# Patient Record
Sex: Female | Born: 1960 | Race: Black or African American | Hispanic: No | Marital: Married | Smoking: Never smoker
Health system: Southern US, Community
[De-identification: ages and names within clinical notes are randomized; demographics above are authoritative.]

## PROBLEM LIST (undated history)

## (undated) DIAGNOSIS — R519 Headache, unspecified: Secondary | ICD-10-CM

## (undated) DIAGNOSIS — Z9889 Other specified postprocedural states: Secondary | ICD-10-CM

## (undated) DIAGNOSIS — M316 Other giant cell arteritis: Secondary | ICD-10-CM

## (undated) DIAGNOSIS — K219 Gastro-esophageal reflux disease without esophagitis: Secondary | ICD-10-CM

## (undated) DIAGNOSIS — I2510 Atherosclerotic heart disease of native coronary artery without angina pectoris: Secondary | ICD-10-CM

## (undated) DIAGNOSIS — E119 Type 2 diabetes mellitus without complications: Secondary | ICD-10-CM

## (undated) DIAGNOSIS — H269 Unspecified cataract: Secondary | ICD-10-CM

## (undated) DIAGNOSIS — Z973 Presence of spectacles and contact lenses: Secondary | ICD-10-CM

## (undated) DIAGNOSIS — E782 Mixed hyperlipidemia: Secondary | ICD-10-CM

## (undated) DIAGNOSIS — R0789 Other chest pain: Secondary | ICD-10-CM

## (undated) DIAGNOSIS — Z5189 Encounter for other specified aftercare: Secondary | ICD-10-CM

## (undated) DIAGNOSIS — Z992 Dependence on renal dialysis: Secondary | ICD-10-CM

## (undated) DIAGNOSIS — N289 Disorder of kidney and ureter, unspecified: Secondary | ICD-10-CM

## (undated) DIAGNOSIS — I10 Essential (primary) hypertension: Secondary | ICD-10-CM

## (undated) DIAGNOSIS — N183 Chronic kidney disease, stage 3 (moderate): Secondary | ICD-10-CM

## (undated) DIAGNOSIS — N186 End stage renal disease: Secondary | ICD-10-CM

## (undated) DIAGNOSIS — I5030 Unspecified diastolic (congestive) heart failure: Secondary | ICD-10-CM

## (undated) DIAGNOSIS — R112 Nausea with vomiting, unspecified: Secondary | ICD-10-CM

## (undated) DIAGNOSIS — D649 Anemia, unspecified: Secondary | ICD-10-CM

## (undated) DIAGNOSIS — R51 Headache: Secondary | ICD-10-CM

## (undated) HISTORY — DX: Mixed hyperlipidemia: E78.2

## (undated) HISTORY — DX: Essential (primary) hypertension: I10

## (undated) HISTORY — DX: Unspecified cataract: H26.9

## (undated) HISTORY — PX: ABDOMINAL HYSTERECTOMY: SHX81

## (undated) HISTORY — PX: APPENDECTOMY: SHX54

## (undated) HISTORY — DX: Gastro-esophageal reflux disease without esophagitis: K21.9

## (undated) HISTORY — DX: Other specified postprocedural states: Z98.890

## (undated) HISTORY — DX: Encounter for other specified aftercare: Z51.89

## (undated) HISTORY — DX: Type 2 diabetes mellitus without complications: E11.9

## (undated) HISTORY — DX: Chronic kidney disease, stage 3 (moderate): N18.3

---

## 2001-01-28 ENCOUNTER — Encounter: Payer: Self-pay | Admitting: Internal Medicine

## 2001-01-28 ENCOUNTER — Ambulatory Visit (HOSPITAL_COMMUNITY): Admission: RE | Admit: 2001-01-28 | Discharge: 2001-01-28 | Payer: Self-pay | Admitting: Internal Medicine

## 2001-06-10 ENCOUNTER — Ambulatory Visit (HOSPITAL_COMMUNITY): Admission: RE | Admit: 2001-06-10 | Discharge: 2001-06-10 | Payer: Self-pay | Admitting: Internal Medicine

## 2001-06-10 ENCOUNTER — Encounter: Payer: Self-pay | Admitting: Internal Medicine

## 2002-05-29 ENCOUNTER — Encounter: Payer: Self-pay | Admitting: Emergency Medicine

## 2002-05-29 ENCOUNTER — Emergency Department (HOSPITAL_COMMUNITY): Admission: EM | Admit: 2002-05-29 | Discharge: 2002-05-29 | Payer: Self-pay | Admitting: Emergency Medicine

## 2002-06-11 ENCOUNTER — Ambulatory Visit (HOSPITAL_COMMUNITY): Admission: RE | Admit: 2002-06-11 | Discharge: 2002-06-11 | Payer: Self-pay | Admitting: Internal Medicine

## 2002-06-11 ENCOUNTER — Encounter: Payer: Self-pay | Admitting: Internal Medicine

## 2003-06-25 ENCOUNTER — Encounter: Admission: RE | Admit: 2003-06-25 | Discharge: 2003-06-25 | Payer: Self-pay | Admitting: Internal Medicine

## 2003-10-07 ENCOUNTER — Emergency Department (HOSPITAL_COMMUNITY): Admission: EM | Admit: 2003-10-07 | Discharge: 2003-10-07 | Payer: Self-pay | Admitting: Emergency Medicine

## 2005-07-13 ENCOUNTER — Ambulatory Visit (HOSPITAL_COMMUNITY): Admission: RE | Admit: 2005-07-13 | Discharge: 2005-07-13 | Payer: Self-pay | Admitting: Family Medicine

## 2005-07-20 ENCOUNTER — Ambulatory Visit: Payer: Self-pay | Admitting: Family Medicine

## 2005-07-20 LAB — CONVERTED CEMR LAB: Pap Smear: NORMAL

## 2005-08-08 ENCOUNTER — Ambulatory Visit: Payer: Self-pay | Admitting: Family Medicine

## 2005-09-19 ENCOUNTER — Ambulatory Visit: Payer: Self-pay | Admitting: Family Medicine

## 2006-01-02 ENCOUNTER — Ambulatory Visit: Payer: Self-pay | Admitting: Family Medicine

## 2006-01-24 ENCOUNTER — Ambulatory Visit: Payer: Self-pay | Admitting: Family Medicine

## 2006-07-16 ENCOUNTER — Ambulatory Visit (HOSPITAL_COMMUNITY): Admission: RE | Admit: 2006-07-16 | Discharge: 2006-07-16 | Payer: Self-pay | Admitting: Family Medicine

## 2006-10-24 ENCOUNTER — Ambulatory Visit: Payer: Self-pay | Admitting: Family Medicine

## 2006-10-24 LAB — CONVERTED CEMR LAB
ALT: 26 units/L (ref 0–35)
AST: 17 units/L (ref 0–37)
Albumin: 4.3 g/dL (ref 3.5–5.2)
Alkaline Phosphatase: 65 units/L (ref 39–117)
BUN: 20 mg/dL (ref 6–23)
Basophils Absolute: 0 10*3/uL (ref 0.0–0.1)
Basophils Relative: 1 % (ref 0–1)
Bilirubin, Direct: 0.1 mg/dL (ref 0.0–0.3)
CO2: 26 meq/L (ref 19–32)
Calcium: 9.4 mg/dL (ref 8.4–10.5)
Chloride: 101 meq/L (ref 96–112)
Cholesterol: 239 mg/dL — ABNORMAL HIGH (ref 0–200)
Creatinine, Ser: 1.46 mg/dL — ABNORMAL HIGH (ref 0.40–1.20)
Eosinophils Absolute: 0.1 10*3/uL (ref 0.0–0.7)
Eosinophils Relative: 2 % (ref 0–5)
Glucose, Bld: 74 mg/dL (ref 70–99)
HCT: 42.5 % (ref 36.0–46.0)
HDL: 63 mg/dL (ref >39)
Hemoglobin: 14 g/dL (ref 12.0–15.0)
Indirect Bilirubin: 0.5 mg/dL (ref 0.0–0.9)
LDL Cholesterol: 153 mg/dL — ABNORMAL HIGH (ref 0–99)
Lymphocytes Relative: 35 % (ref 12–46)
Lymphs Abs: 2.9 10*3/uL (ref 0.7–3.3)
MCHC: 32.9 g/dL (ref 30.0–36.0)
MCV: 88.5 fL (ref 78.0–100.0)
Monocytes Absolute: 0.6 10*3/uL (ref 0.2–0.7)
Monocytes Relative: 7 % (ref 3–11)
Neutro Abs: 4.6 10*3/uL (ref 1.7–7.7)
Neutrophils Relative %: 56 % (ref 43–77)
Platelets: 314 10*3/uL (ref 150–400)
Potassium: 3.4 meq/L — ABNORMAL LOW (ref 3.5–5.3)
RBC: 4.8 M/uL (ref 3.87–5.11)
RDW: 14.4 % — ABNORMAL HIGH (ref 11.5–14.0)
Sodium: 142 meq/L (ref 135–145)
TSH: 0.564 microintl units/mL (ref 0.350–5.50)
Total Bilirubin: 0.6 mg/dL (ref 0.3–1.2)
Total CHOL/HDL Ratio: 3.8
Total Protein: 8 g/dL (ref 6.0–8.3)
Triglycerides: 117 mg/dL (ref <150)
VLDL: 23 mg/dL (ref 0–40)
WBC: 8.3 10*3/uL (ref 4.0–10.5)

## 2006-12-27 ENCOUNTER — Ambulatory Visit: Payer: Self-pay | Admitting: Family Medicine

## 2007-02-07 ENCOUNTER — Encounter: Payer: Self-pay | Admitting: Family Medicine

## 2007-02-07 ENCOUNTER — Other Ambulatory Visit: Admission: RE | Admit: 2007-02-07 | Discharge: 2007-02-07 | Payer: Self-pay | Admitting: Family Medicine

## 2007-02-07 ENCOUNTER — Ambulatory Visit: Payer: Self-pay | Admitting: Family Medicine

## 2007-02-07 LAB — CONVERTED CEMR LAB: Pap Smear: NORMAL

## 2007-03-11 ENCOUNTER — Encounter: Payer: Self-pay | Admitting: Family Medicine

## 2007-03-11 LAB — CONVERTED CEMR LAB
BUN: 24 mg/dL — ABNORMAL HIGH (ref 6–23)
CO2: 25 meq/L (ref 19–32)
Calcium: 9.3 mg/dL (ref 8.4–10.5)
Chloride: 103 meq/L (ref 96–112)
Cholesterol: 225 mg/dL — ABNORMAL HIGH (ref 0–200)
Creatinine, Ser: 1.59 mg/dL — ABNORMAL HIGH (ref 0.40–1.20)
Glucose, Bld: 82 mg/dL (ref 70–99)
HDL: 60 mg/dL (ref >39)
LDL Cholesterol: 155 mg/dL — ABNORMAL HIGH (ref 0–99)
Potassium: 3.5 meq/L (ref 3.5–5.3)
Sodium: 143 meq/L (ref 135–145)
Total CHOL/HDL Ratio: 3.8
Triglycerides: 52 mg/dL (ref <150)
VLDL: 10 mg/dL (ref 0–40)

## 2007-04-12 ENCOUNTER — Ambulatory Visit: Payer: Self-pay | Admitting: Family Medicine

## 2007-04-15 ENCOUNTER — Ambulatory Visit (HOSPITAL_COMMUNITY): Admission: RE | Admit: 2007-04-15 | Discharge: 2007-04-15 | Payer: Self-pay | Admitting: Family Medicine

## 2007-05-22 ENCOUNTER — Ambulatory Visit: Payer: Self-pay | Admitting: Family Medicine

## 2007-07-18 ENCOUNTER — Ambulatory Visit (HOSPITAL_COMMUNITY): Admission: RE | Admit: 2007-07-18 | Discharge: 2007-07-18 | Payer: Self-pay | Admitting: Family Medicine

## 2007-08-15 ENCOUNTER — Encounter: Payer: Self-pay | Admitting: Family Medicine

## 2007-11-01 ENCOUNTER — Emergency Department (HOSPITAL_COMMUNITY): Admission: EM | Admit: 2007-11-01 | Discharge: 2007-11-01 | Payer: Self-pay | Admitting: Emergency Medicine

## 2008-03-11 ENCOUNTER — Encounter: Payer: Self-pay | Admitting: Family Medicine

## 2008-03-11 DIAGNOSIS — E669 Obesity, unspecified: Secondary | ICD-10-CM | POA: Insufficient documentation

## 2008-03-11 DIAGNOSIS — I10 Essential (primary) hypertension: Secondary | ICD-10-CM | POA: Insufficient documentation

## 2008-03-11 DIAGNOSIS — E782 Mixed hyperlipidemia: Secondary | ICD-10-CM | POA: Insufficient documentation

## 2008-07-01 ENCOUNTER — Ambulatory Visit (HOSPITAL_COMMUNITY): Admission: RE | Admit: 2008-07-01 | Discharge: 2008-07-01 | Payer: Self-pay | Admitting: Family Medicine

## 2008-10-10 ENCOUNTER — Observation Stay (HOSPITAL_COMMUNITY): Admission: EM | Admit: 2008-10-10 | Discharge: 2008-10-11 | Payer: Self-pay | Admitting: Emergency Medicine

## 2009-06-12 ENCOUNTER — Emergency Department (HOSPITAL_COMMUNITY): Admission: EM | Admit: 2009-06-12 | Discharge: 2009-06-12 | Payer: Self-pay | Admitting: Emergency Medicine

## 2009-07-05 ENCOUNTER — Ambulatory Visit (HOSPITAL_COMMUNITY): Admission: RE | Admit: 2009-07-05 | Discharge: 2009-07-05 | Payer: Self-pay | Admitting: Family Medicine

## 2009-07-05 LAB — HM MAMMOGRAPHY: HM Mammogram: NORMAL

## 2009-08-14 DIAGNOSIS — Z9889 Other specified postprocedural states: Secondary | ICD-10-CM

## 2009-08-14 HISTORY — PX: COLONOSCOPY: SHX174

## 2009-08-14 HISTORY — DX: Other specified postprocedural states: Z98.890

## 2009-11-16 LAB — HM PAP SMEAR: HM Pap smear: NORMAL

## 2010-04-19 ENCOUNTER — Emergency Department (HOSPITAL_COMMUNITY): Admission: EM | Admit: 2010-04-19 | Discharge: 2010-04-19 | Payer: Self-pay | Admitting: Emergency Medicine

## 2010-04-21 ENCOUNTER — Encounter: Payer: Self-pay | Admitting: Adult Health

## 2010-04-21 ENCOUNTER — Encounter (INDEPENDENT_AMBULATORY_CARE_PROVIDER_SITE_OTHER): Payer: Self-pay | Admitting: *Deleted

## 2010-04-21 ENCOUNTER — Ambulatory Visit: Payer: Self-pay | Admitting: Cardiology

## 2010-04-21 DIAGNOSIS — R0602 Shortness of breath: Secondary | ICD-10-CM

## 2010-04-21 DIAGNOSIS — R0600 Dyspnea, unspecified: Secondary | ICD-10-CM | POA: Insufficient documentation

## 2010-04-21 DIAGNOSIS — R0789 Other chest pain: Secondary | ICD-10-CM | POA: Insufficient documentation

## 2010-04-22 ENCOUNTER — Encounter: Payer: Self-pay | Admitting: Family Medicine

## 2010-04-22 LAB — CONVERTED CEMR LAB
Pro B Natriuretic peptide (BNP): 7.2 pg/mL (ref 0.0–100.0)
TSH: 0.852 microintl units/mL (ref 0.350–4.500)

## 2010-04-27 ENCOUNTER — Encounter: Payer: Self-pay | Admitting: Cardiology

## 2010-04-27 ENCOUNTER — Ambulatory Visit (HOSPITAL_COMMUNITY): Admission: RE | Admit: 2010-04-27 | Discharge: 2010-04-27 | Payer: Self-pay | Admitting: Cardiology

## 2010-04-27 ENCOUNTER — Ambulatory Visit: Payer: Self-pay | Admitting: Cardiology

## 2010-05-03 ENCOUNTER — Ambulatory Visit: Payer: Self-pay | Admitting: Cardiology

## 2010-07-11 ENCOUNTER — Ambulatory Visit (HOSPITAL_COMMUNITY): Admission: RE | Admit: 2010-07-11 | Discharge: 2010-07-11 | Payer: Self-pay | Admitting: Family Medicine

## 2010-09-03 ENCOUNTER — Encounter: Payer: Self-pay | Admitting: Internal Medicine

## 2010-09-13 NOTE — Assessment & Plan Note (Signed)
Summary: POST ED VISIT ON 04/19/10/TG   Visit Type:  Initial Consult Primary Provider:  [facility]  CC:  some chestpain.  History of Present Illness: [name] is a pleasant 50 y/o AAF we are seeing on referral from [facility] practice for complaints of chest pain.  She had no prior history of CAD.  She has a 17 yr history of hypertension with pre-eclampsia during pregnancy.  She has been on antihypertensives since that time. She was seen in the [facility] ER for complaints of constant chest pressure lasting a week, waxing and waneing associated witn dyspnea.  In the ER she was found to be hypokalemic with level of 2.9.  She was repleted and advised to follow-up with cardiology for evaluation.  Her additional history includes arthritis and hypercholesterolemia.  She states the pain is a pressure with some radiation to the left arm, and increased dyspnea sometimes only at rest, but with exertion as well.    Preventive Screening-Counseling & Management  Alcohol-Tobacco     Alcohol drinks/day: 0     Smoking Status: never  Current Medications (verified): 1)  Azor 10-40 Mg  Tabs (Amlodipine-Olmesartan) .... One Tab By Mouth Once Daily 2)  Aspir-Low 81 Mg Tbec (Aspirin) .... Take 1 Tab Daily 3)  Naprosyn 500 Mg Tabs (Naproxen) .... Use As Needed Only 4)  Lipitor 20 Mg Tabs (Atorvastatin Calcium) .... Take 1 Tab Daily 5)  Hydrochlorothiazide 12.5 Mg Caps (Hydrochlorothiazide) .... Take 1 Tablet By Mouth Once A Day 6)  Nitrostat 0.4 Mg Subl (Nitroglycerin) .... Take As Needed 7)  Klor-Con 20 Meq Pack (Potassium Chloride) .... Use 1 Pack  By Mouth Daily  Allergies (verified): No Known Drug Allergies  Family History: Dad -good health Mom deceased HTN,DM,MI age 54 Brothers x2 Hypertension  Social History: [employer] Divorced one child Never Smoked Alcohol use-no Drug use-no Alcohol drinks/day:  0  Review of Systems       L arm pain.  All other  systems have been reviewed and are negative unless stated above.   Vital Signs:  Patient profile:   50 year old female Height:      64 inches Weight:      162 pounds BMI:     27.91 Pulse rate:   81 / minute BP sitting:   127 / 79  (right arm)  Vitals Entered By: Doretha Sou, CNA (April 21, 2010 1:01 PM)  Physical Exam  General:  Well developed, well nourished, in no acute distress. Head:  normocephalic and atraumatic Eyes:  PERRLA/EOM intact; conjunctiva and lids normal. Wears glasses Mouth:  Teeth, gums and palate normal. Oral mucosa normal. Lungs:  Diminshed in the LLL Heart:  Non-displaced PMI, chest non-tender; regular rate and rhythm, S1, S2 without murmurs, rubs or gallops. Carotid upstroke normal, no bruit. Normal abdominal aortic size, no bruits. Femorals normal pulses, no bruits. Pedals normal pulses. No edema, no varicosities. Abdomen:  Bowel sounds positive; abdomen soft and non-tender without masses, organomegaly, or hernias noted. No hepatosplenomegaly. Msk:  Back normal, normal gait. Muscle strength and tone normal. Pulses:  pulses normal in all 4 extremities Extremities:  No clubbing or cyanosis. Neurologic:  Alert and oriented x 3. Psych:  Normal affect.   EKG  Procedure date:  04/21/2010  Findings:      Normal sinus rhythm with rate of:  78 bpm  Impression & Recommendations:  Problem # 1:  CHEST PAIN (ICD-786.50) Pain appears atypical for cardiac etiology  of chest pain.  Hypokalemia can certainly cause chest pain, but she is now on repletion.  After being seen as well by Dr. Lattie Haw, we will decrease her HCTZ and K-replacement. to half doses. Recheck her BMET in two weeks.  We will schedule stress echo for diagnostic/prognostic purposes.  Follow up appointment for discussion fo test results. The following medications were removed from the medication list:    Benazepril-hydrochlorothiazide 20-12.5 Mg Tabs (Benazepril-hydrochlorothiazide) .Marland Kitchen..Marland Kitchen Two tabs  by mouth once daily    Cardizem La 180 Mg Xr24h-tab (Diltiazem hcl coated beads) ..... One tab by mouth once daily Her updated medication list for this problem includes:    Azor 10-40 Mg Tabs (Amlodipine-olmesartan) ..... One tab by mouth once daily    Aspir-low 81 Mg Tbec (Aspirin) .Marland Kitchen... Take 1 tab daily    Nitrostat 0.4 Mg Subl (Nitroglycerin) .Marland Kitchen... Take as needed  Orders: EKG w/ Interpretation (93000) T-D-Dimer Fibrin Derivatives Quantitive [id number]) T-TSH [phone]) Stress Echo (Stress Echo)  Problem # 2:  SHORTNESS OF BREATH (ICD-786.05) Treadmill test will allow [place] to evaluate breathing status during exertion. The following medications were removed from the medication list:    Benazepril-hydrochlorothiazide 20-12.5 Mg Tabs (Benazepril-hydrochlorothiazide) .Marland Kitchen..Marland Kitchen Two tabs by mouth once daily    Cardizem La 180 Mg Xr24h-tab (Diltiazem hcl coated beads) ..... One tab by mouth once daily Her updated medication list for this problem includes:    Azor 10-40 Mg Tabs (Amlodipine-olmesartan) ..... One tab by mouth once daily    Aspir-low 81 Mg Tbec (Aspirin) .Marland Kitchen... Take 1 tab daily    Hydrochlorothiazide 12.5 Mg Caps (Hydrochlorothiazide) .Marland Kitchen... Take 1 tablet by mouth once a day  Orders: T-BNP  (B Natriuretic Peptide) [id number]) T-D-Dimer Fibrin Derivatives Quantitive [id number]) T-TSH [phone]) Stress Echo (Stress Echo)  Problem # 3:  HYPERTENSION (ICD-401.9) Continue to monitor this with changes in medications. The following medications were removed from the medication list:    Benazepril-hydrochlorothiazide 20-12.5 Mg Tabs (Benazepril-hydrochlorothiazide) .Marland Kitchen..Marland Kitchen Two tabs by mouth once daily    Cardizem La 180 Mg Xr24h-tab (Diltiazem hcl coated beads) ..... One tab by mouth once daily Her updated medication list for this problem includes:    Azor 10-40 Mg Tabs (Amlodipine-olmesartan) ..... One tab by mouth once daily    Aspir-low 81 Mg Tbec (Aspirin) .Marland Kitchen... Take 1 tab  daily    Hydrochlorothiazide 12.5 Mg Caps (Hydrochlorothiazide) .Marland Kitchen... Take 1 tablet by mouth once a day  Patient Instructions: 1)  Your physician has recommended you make the following change in your medication: DECREASE HYDROCHLOROTHIAZIDE TO 12.5MG  DAILY.  You may break your 25mg  in half until they are finished. ALSO,YOUR POTASSIUM HAS BEEN DECREASED TO ONCE DAILY. Your new prescriptions have been sent to your pharmacy. 2)  Your physician has requested that you have a stress echocardiogram. For further information please visit [email or web address].  Please follow instruction sheet as given. 3)  Your physician recommends that you return for lab work in:BNP,TSH,D-DIMER. Please have these done at [facility]. 4)  Your physician recommends that you schedule a follow-up appointment in: after your testing is completed. Prescriptions: KLOR-CON 20 MEQ PACK (POTASSIUM CHLORIDE) use 1 pack  by mouth daily  #30 x 6   Entered by:   Georgina Peer   Authorized by:   Jory Sims, NP   Signed by:   Georgina Peer on 04/21/2010   Method used:   Electronically to        [facility]. [phone]* (retail)       [phone]       [place], [place]  [address]       Ph: [phone] or [phone]       Fax: [phone]   RxID:   [id number] HYDROCHLOROTHIAZIDE 12.5 MG CAPS (HYDROCHLOROTHIAZIDE) Take 1 tablet by mouth once a day  #30 x 6   Entered by:   Georgina Peer   Authorized by:   Jory Sims, NP   Signed by:   Georgina Peer on 04/21/2010   Method used:   Electronically to        [facility]. [phone]* (retail)       [address]       [place], [place]  [address]       Ph: [phone] or [phone]       Fax: [phone]   RxID:   [id number]

## 2010-09-13 NOTE — Assessment & Plan Note (Signed)
Summary: F/U TESTS TO BE DONE 04/27/10/TG   Visit Type:  Follow-up Primary Provider:  [facility]  CC:  no cardiology complaints.  History of Present Illness: Megan [name] is a very pleasant 50 y/o AAF who we are seeing on consultation for chest pain. She has a history of hypertension and hypercholesterolemia.  She was schduled for a stress echo for evaluation of chest discomfort and is here for results.  She continues to have occaisional discomfort but it is transient.  Current Medications (verified): 1)  Azor 10-40 Mg  Tabs (Amlodipine-Olmesartan) .... One Tab By Mouth Once Daily 2)  Aspir-Low 81 Mg Tbec (Aspirin) .... Take 1 Tab Daily 3)  Naprosyn 500 Mg Tabs (Naproxen) .... Use As Needed Only 4)  Lipitor 20 Mg Tabs (Atorvastatin Calcium) .... Take 1 Tab Daily 5)  Hydrochlorothiazide 12.5 Mg Caps (Hydrochlorothiazide) .... Take 1 Tablet By Mouth Once A Day 6)  Nitrostat 0.4 Mg Subl (Nitroglycerin) .... Take As Needed 7)  Klor-Con 20 Meq Pack (Potassium Chloride) .... Use 1 Pack  By Mouth Daily  Allergies (verified): No Known Drug Allergies  Review of Systems       All other systems have been reviewed and are negative unless stated above.   Vital Signs:  Patient profile:   50 year old female Weight:      164 pounds BMI:     28.25 Pulse rate:   92 / minute BP sitting:   127 / 85  (right arm)  Vitals Entered ByJeani Hawking Via LPN (September 20, 624THL 1:43 PM)  Physical Exam  General:  Well developed, well nourished, in no acute distress. Lungs:  Clear bilaterally to auscultation and percussion. Heart:  Non-displaced PMI, chest non-tender; regular rate and rhythm, S1, S2 without murmurs, rubs or gallops. Carotid upstroke normal, no bruit. Normal abdominal aortic size, no bruits. Femorals normal pulses, no bruits. Pedals normal pulses. No edema, no varicosities. Psych:  Normal affect.   Impression & Recommendations:  Problem # 1:  CHEST PAIN  (ICD-786.50) Patients stress echo demonstrated  Negative exercise treadmill test for ischemia at a maximum workload   of 7 mets.  No chest pain was reported.  There was a hypertensive  response to exercise.  No inducible arrhythmias.  No inducible wall motion abnormalities to indicate ischemia.  Ressurance to patient concerning heart disease and need to maintain adequate BP. She is normal here in the office with this assessment on current medications.  I have discussed other etiologies of chest pain to include GI disturbances, Gallbladder pain, and enxiety.  Sheis referred backl to her primary care at [facility] practice for further workup.    Her updated medication list for this problem includes:    Azor 10-40 Mg Tabs (Amlodipine-olmesartan) ..... One tab by mouth once daily    Aspir-low 81 Mg Tbec (Aspirin) .Marland Kitchen... Take 1 tab daily    Nitrostat 0.4 Mg Subl (Nitroglycerin) .Marland Kitchen... Take as needed  Orders: Misc. Referral (Misc. Ref)  Problem # 2:  HYPERTENSION (ICD-401.9) Assessment: Unchanged  Her updated medication list for this problem includes:    Azor 10-40 Mg Tabs (Amlodipine-olmesartan) ..... One tab by mouth once daily    Aspir-low 81 Mg Tbec (Aspirin) .Marland Kitchen... Take 1 tab daily    Hydrochlorothiazide 12.5 Mg Caps (Hydrochlorothiazide) .Marland Kitchen... Take 1 tablet by mouth once a day  Patient Instructions: 1)  Your physician recommends that you schedule a follow-up appointment in: as needed (you will follow up with [facility] for non-cardiac chest pain) 2)  Your physician recommends that you continue on your current medications as directed. Please refer to the Current Medication list given to you today.

## 2010-09-13 NOTE — Letter (Signed)
Summary: Stress Echocardiogram Information Sheet  [facility] [place]. [address]  [address]   Phone: [phone]  Fax: [phone]      April 21, 2010 MRN: [id number] light prior to the test.   Megan [name]  Doctor: Appointment Date: Appointment Time: Appointment Location: [facility]  Stress Echocardiogram Information Sheet    Instructions:   1. You may take your  medicine the dayof your  test.  2. Eat light prior to the test.  3. Dress prepared to exercise.  4. DO NOT use ANY caffine or tobacco products 3 hours before appointment.  5. Report to the [facility] on the1st floor.  6. Please bring all current prescription medications.  7. If you have any questions, please call [phone]

## 2010-09-13 NOTE — Letter (Signed)
Summary: rpc chart  rpc chart   Imported By: Eliezer Mccoy 03/14/2010 14:44:08  _____________________________________________________________________  External Attachment:    Type:   Image     Comment:   External Document

## 2010-10-27 LAB — CBC
HCT: 37.3 % (ref 36.0–46.0)
Hemoglobin: 12.4 g/dL (ref 12.0–15.0)
MCH: 29.1 pg (ref 26.0–34.0)
MCHC: 33.3 g/dL (ref 30.0–36.0)
MCV: 87.4 fL (ref 78.0–100.0)
Platelets: 283 10*3/uL (ref 150–400)
RBC: 4.27 MIL/uL (ref 3.87–5.11)
RDW: 16 % — ABNORMAL HIGH (ref 11.5–15.5)
WBC: 6.7 10*3/uL (ref 4.0–10.5)

## 2010-10-27 LAB — POCT CARDIAC MARKERS
CKMB, poc: 1.8 ng/mL (ref 1.0–8.0)
Myoglobin, poc: 151 ng/mL (ref 12–200)
Troponin i, poc: <0.05 ng/mL (ref 0.00–0.09)

## 2010-10-27 LAB — DIFFERENTIAL
Basophils Absolute: 0 10*3/uL (ref 0.0–0.1)
Basophils Relative: 1 % (ref 0–1)
Eosinophils Absolute: 0.1 10*3/uL (ref 0.0–0.7)
Eosinophils Relative: 2 % (ref 0–5)
Lymphocytes Relative: 32 % (ref 12–46)
Lymphs Abs: 2.1 10*3/uL (ref 0.7–4.0)
Monocytes Absolute: 0.6 10*3/uL (ref 0.1–1.0)
Monocytes Relative: 9 % (ref 3–12)
Neutro Abs: 3.8 10*3/uL (ref 1.7–7.7)
Neutrophils Relative %: 56 % (ref 43–77)

## 2010-10-27 LAB — D-DIMER, QUANTITATIVE: D-Dimer, Quant: 0.4 ug/mL-FEU (ref 0.00–0.48)

## 2010-10-27 LAB — BASIC METABOLIC PANEL
BUN: 23 mg/dL (ref 6–23)
CO2: 28 mEq/L (ref 19–32)
Calcium: 9.4 mg/dL (ref 8.4–10.5)
Chloride: 105 mEq/L (ref 96–112)
Creatinine, Ser: 1.25 mg/dL — ABNORMAL HIGH (ref 0.4–1.2)
GFR calc Af Amer: 55 mL/min — ABNORMAL LOW (ref >60)
GFR calc non Af Amer: 46 mL/min — ABNORMAL LOW (ref >60)
Glucose, Bld: 102 mg/dL — ABNORMAL HIGH (ref 70–99)
Potassium: 2.9 mEq/L — ABNORMAL LOW (ref 3.5–5.1)
Sodium: 141 mEq/L (ref 135–145)

## 2010-11-29 LAB — CBC
HCT: 39.5 % (ref 36.0–46.0)
Hemoglobin: 13.2 g/dL (ref 12.0–15.0)
MCHC: 33.4 g/dL (ref 30.0–36.0)
MCV: 89.5 fL (ref 78.0–100.0)
Platelets: 296 10*3/uL (ref 150–400)
RBC: 4.42 MIL/uL (ref 3.87–5.11)
RDW: 15.5 % (ref 11.5–15.5)
WBC: 6.7 10*3/uL (ref 4.0–10.5)

## 2010-11-29 LAB — BASIC METABOLIC PANEL
BUN: 15 mg/dL (ref 6–23)
BUN: 17 mg/dL (ref 6–23)
CO2: 25 mEq/L (ref 19–32)
CO2: 30 mEq/L (ref 19–32)
Calcium: 8.8 mg/dL (ref 8.4–10.5)
Calcium: 9.3 mg/dL (ref 8.4–10.5)
Chloride: 102 mEq/L (ref 96–112)
Chloride: 108 mEq/L (ref 96–112)
Creatinine, Ser: 1.28 mg/dL — ABNORMAL HIGH (ref 0.4–1.2)
Creatinine, Ser: 1.3 mg/dL — ABNORMAL HIGH (ref 0.4–1.2)
GFR calc Af Amer: 53 mL/min — ABNORMAL LOW (ref >60)
GFR calc Af Amer: 54 mL/min — ABNORMAL LOW (ref >60)
GFR calc non Af Amer: 44 mL/min — ABNORMAL LOW (ref >60)
GFR calc non Af Amer: 45 mL/min — ABNORMAL LOW (ref >60)
Glucose, Bld: 110 mg/dL — ABNORMAL HIGH (ref 70–99)
Glucose, Bld: 126 mg/dL — ABNORMAL HIGH (ref 70–99)
Potassium: 3 mEq/L — ABNORMAL LOW (ref 3.5–5.1)
Potassium: 3.7 mEq/L (ref 3.5–5.1)
Sodium: 138 mEq/L (ref 135–145)
Sodium: 139 mEq/L (ref 135–145)

## 2010-11-29 LAB — HEPATIC FUNCTION PANEL
ALT: 16 U/L (ref 0–35)
AST: 18 U/L (ref 0–37)
Albumin: 3.5 g/dL (ref 3.5–5.2)
Alkaline Phosphatase: 72 U/L (ref 39–117)
Bilirubin, Direct: 0.1 mg/dL (ref 0.0–0.3)
Indirect Bilirubin: 0.5 mg/dL (ref 0.3–0.9)
Total Bilirubin: 0.6 mg/dL (ref 0.3–1.2)
Total Protein: 6.8 g/dL (ref 6.0–8.3)

## 2010-11-29 LAB — POCT CARDIAC MARKERS
CKMB, poc: 1.6 ng/mL (ref 1.0–8.0)
Myoglobin, poc: 109 ng/mL (ref 12–200)
Troponin i, poc: <0.05 ng/mL (ref 0.00–0.09)

## 2010-11-29 LAB — CARDIAC PANEL(CRET KIN+CKTOT+MB+TROPI)
CK, MB: 2.1 ng/mL (ref 0.3–4.0)
CK, MB: 2.6 ng/mL (ref 0.3–4.0)
CK, MB: 3.3 ng/mL (ref 0.3–4.0)
Relative Index: 2.3 (ref 0.0–2.5)
Relative Index: 2.4 (ref 0.0–2.5)
Relative Index: INVALID (ref 0.0–2.5)
Total CK: 115 U/L (ref 7–177)
Total CK: 140 U/L (ref 7–177)
Total CK: 94 U/L (ref 7–177)
Troponin I: <0.01 ng/mL (ref 0.00–0.06)
Troponin I: <0.01 ng/mL (ref 0.00–0.06)
Troponin I: <0.01 ng/mL (ref 0.00–0.06)

## 2010-11-29 LAB — D-DIMER, QUANTITATIVE (NOT AT ARMC): D-Dimer, Quant: 18.58 ug/mL-FEU — ABNORMAL HIGH (ref 0.00–0.48)

## 2010-12-27 NOTE — Discharge Summary (Signed)
NAME:  [name], Megan [name]           ACCOUNT NO.:  [id number]   MEDICAL RECORD NO.:  [id number]          PATIENT TYPE:  INP   LOCATION:  [id number]                          FACILITY:  [facility]   PHYSICIAN:  Unk Lightning, MDDATE OF BIRTH:  [birth year]   DATE OF ADMISSION:  10/10/2008  DATE OF DISCHARGE:  02/28/2010LH                               DISCHARGE SUMMARY   The patient is a 50 year old black female, who has a history of  hypertension and hyperlipidemia, who was admitted with retrosternal  chest pain over the preceding 36 hours.  This was at rest associated  with some nausea, but no diaphoresis.  The pain was not exertional, not  precipitated by activity, not relieved with rest.  The patient was seen,  cardiac enzymes were initially negative x2.  She had had her  hypertension well controlled with Azor 10/40 and was on Lipitor 20.  This was well controlled in office.  While in the hospital, she had some  hypokaliemia, potassium of 3.0.  This was rectified in the ER.  Her D-  dimer was grossly elevated 18.5.  No evidence of DVT clinically.  Consideration given was to PE, ventilation/perfusion scan was absolutely  normal.  She subsequently was discharged within 24 hours.  There was no  clinical suspicion for coronary artery disease and pulmonary embolism  was ruled out on the basis of V/Q scan, this was sort of an enigma.  The  patient will continue current therapy.  Add aspirin as antiplatelet  therapy and continue to observe the patient.  She had negative exercise  test 3 or 4 years ago and she had negative mammogram in November 2009.  She will follow up in the office in 2 days' time.      Unk Lightning, MD  Electronically Signed     RMD/MEDQ  D:  10/11/2008  T:  10/11/2008  Job:  [id number]

## 2010-12-27 NOTE — H&P (Signed)
NAME:  Megan [name], Megan [name]           ACCOUNT NO.:  [id number]   MEDICAL RECORD NO.:  [id number]          PATIENT TYPE:  INP   LOCATION:  A335                          FACILITY:  [facility]   PHYSICIAN:  Paula Compton. Willey Blade, MD       DATE OF BIRTH:  20-Sep-1960   DATE OF ADMISSION:  10/10/2008  DATE OF DISCHARGE:  LH                              HISTORY & PHYSICAL   CHIEF COMPLAINT:  Chest pain.   HISTORY OF PRESENT ILLNESS:  This patient is a 50 year old African  American female, who presented to the emergency room on Saturday morning  after developing chest pain since Wednesday.  She had been at work when  it had begun.  The pain has been essentially constant and has been rated  at 4-5/10.  She has felt shortness of breath and nausea.  She has not  vomited.  She denies diaphoresis.  She has experienced radiation to the  left arm.  She received 2 nitroglycerin in the emergency room and noted  modest relief.  Her EKG and initial cardiac markers were negative.  She  denies any heartburn.  She has not used any antacids.  She notes no  change in her symptoms from position or activity.  She has a family  history of heart disease.  Her mother had an MI at 49.  She has  hypertension and hyperlipidemia.  She does not have diabetes.  She does  not smoke cigarettes.   PAST MEDICAL HISTORY:  1. Hypertension.  2. Hyperlipidemia.  3. Hysterectomy.  4. Negative stress test years ago.   MEDICATIONS:  1. Lipitor 20 mg daily.  2. Azor 10/40 mg daily.  3. Hydrochlorothiazide 25 mg daily.   ALLERGIES:  None.   SOCIAL HISTORY:  She works at [employer].  She does not smoke or drink.   FAMILY HISTORY:  Her mother had diabetes, hypertension, and an MI.  Her  father is well.  A sister and a brother have hypertension.   REVIEW OF SYSTEMS:  No difficulty swallowing, back pain, fever, chills,  or breast problems.  Her mammogram is up-to-date.  She denies any  problems with her bowel habits or urination.   PHYSICAL EXAMINATION:  VITAL SIGNS:  Temperature 97.5, pulse 69,  respirations 22, and blood pressure 140/95.  GENERAL:  Alert and in no acute distress.  HEENT:  No scleral icterus.  Pharynx appears normal.  NECK:  No JVD, thyromegaly, or carotid bruits.  LUNGS:  Clear.  HEART:  Regular with no murmurs.  CHEST:  Nontender.  ABDOMEN:  Nontender.  No hepatosplenomegaly.  EXTREMITIES:  Normal pulses.  No cyanosis, clubbing, or edema.  NEUROLOGIC:  Grossly intact.  LYMPH NODES:  No cervical or supraclavicular enlargement.   LABORATORY DATA:  Her EKG reveals normal sinus rhythm with no ischemic  changes.  Her chest x-ray reveals borderline cardiomegaly and no  infiltrate.  Her CBC reveals a white count of 6.7, hemoglobin 13.2, and  platelets 296,000.  Troponin I is less than 0.05.  Sodium 138, potassium  3.0, bicarb 30, glucose 126, BUN 17, creatinine 1.3, and calcium 9.3.  IMPRESSION:  1. Chest pain.  She has left chest pain with some radiation to the      left arm, which has been improved with nitroglycerin.  She has risk      factors of family history, hypertension, and hyperlipidemia.  She      will be observed in a hospitalized setting and will have serial      cardiac enzymes and cardiac monitoring.  2. Hypertension.  Continue Azor and hydrochlorothiazide.  3. Hypokalemia.  Replace orally.  She has already received 40 mEq of      potassium in the emergency room.  4. Hyperlipidemia.  Continue Lipitor 20 mg a day.      Paula Compton. Willey Blade, MD  Electronically Signed     ROF/MEDQ  D:  10/10/2008  T:  10/10/2008  Job:  [id number]

## 2011-03-16 ENCOUNTER — Encounter: Payer: Self-pay | Admitting: Family Medicine

## 2011-03-16 DIAGNOSIS — E7800 Pure hypercholesterolemia, unspecified: Secondary | ICD-10-CM | POA: Insufficient documentation

## 2011-03-16 DIAGNOSIS — I10 Essential (primary) hypertension: Secondary | ICD-10-CM | POA: Insufficient documentation

## 2011-05-08 LAB — URINALYSIS, ROUTINE W REFLEX MICROSCOPIC
Bilirubin Urine: NEGATIVE
Glucose, UA: NEGATIVE
Hgb urine dipstick: NEGATIVE
Ketones, ur: NEGATIVE
Nitrite: NEGATIVE
Protein, ur: NEGATIVE
Specific Gravity, Urine: 1.02
Urobilinogen, UA: 0.2
pH: 7

## 2011-05-08 LAB — URINE MICROSCOPIC-ADD ON

## 2011-05-18 ENCOUNTER — Encounter (INDEPENDENT_AMBULATORY_CARE_PROVIDER_SITE_OTHER): Payer: Self-pay | Admitting: *Deleted

## 2011-05-19 ENCOUNTER — Encounter (INDEPENDENT_AMBULATORY_CARE_PROVIDER_SITE_OTHER): Payer: Self-pay | Admitting: *Deleted

## 2011-06-05 ENCOUNTER — Other Ambulatory Visit: Payer: Self-pay | Admitting: Family Medicine

## 2011-06-05 DIAGNOSIS — Z139 Encounter for screening, unspecified: Secondary | ICD-10-CM

## 2011-06-06 ENCOUNTER — Encounter (HOSPITAL_COMMUNITY): Payer: Self-pay

## 2011-06-06 ENCOUNTER — Emergency Department (HOSPITAL_COMMUNITY)
Admission: EM | Admit: 2011-06-06 | Discharge: 2011-06-06 | Disposition: A | Discharge status: Home / Self Care | Payer: Worker's Compensation | Attending: Emergency Medicine | Admitting: Emergency Medicine

## 2011-06-06 DIAGNOSIS — F329 Major depressive disorder, single episode, unspecified: Secondary | ICD-10-CM | POA: Insufficient documentation

## 2011-06-06 DIAGNOSIS — F3289 Other specified depressive episodes: Secondary | ICD-10-CM | POA: Insufficient documentation

## 2011-06-06 DIAGNOSIS — E7800 Pure hypercholesterolemia, unspecified: Secondary | ICD-10-CM | POA: Insufficient documentation

## 2011-06-06 DIAGNOSIS — S39012A Strain of muscle, fascia and tendon of lower back, initial encounter: Secondary | ICD-10-CM

## 2011-06-06 DIAGNOSIS — Y9269 Other specified industrial and construction area as the place of occurrence of the external cause: Secondary | ICD-10-CM | POA: Insufficient documentation

## 2011-06-06 DIAGNOSIS — X500XXA Overexertion from strenuous movement or load, initial encounter: Secondary | ICD-10-CM | POA: Insufficient documentation

## 2011-06-06 DIAGNOSIS — I10 Essential (primary) hypertension: Secondary | ICD-10-CM | POA: Insufficient documentation

## 2011-06-06 DIAGNOSIS — S335XXA Sprain of ligaments of lumbar spine, initial encounter: Secondary | ICD-10-CM | POA: Insufficient documentation

## 2011-06-06 MED ORDER — KETOROLAC TROMETHAMINE 60 MG/2ML IM SOLN
60.0000 mg | Freq: Once | INTRAMUSCULAR | Status: AC
  Administered 2011-06-06: 60 mg via INTRAMUSCULAR
  Filled 2011-06-06: qty 2

## 2011-06-06 MED ORDER — HYDROCODONE-ACETAMINOPHEN 5-325 MG PO TABS: ORAL_TABLET | ORAL | Status: DC

## 2011-06-06 MED ORDER — CYCLOBENZAPRINE HCL 10 MG PO TABS: ORAL_TABLET | ORAL | Status: DC

## 2011-06-06 MED ORDER — ONDANSETRON 8 MG PO TBDP
8.0000 mg | ORAL_TABLET | Freq: Once | ORAL | Status: AC
  Administered 2011-06-06: 8 mg via ORAL
  Filled 2011-06-06: qty 1

## 2011-06-06 MED ORDER — HYDROMORPHONE HCL 1 MG/ML IJ SOLN
1.0000 mg | Freq: Once | INTRAMUSCULAR | Status: AC
  Administered 2011-06-06: 1 mg via INTRAMUSCULAR
  Filled 2011-06-06: qty 1

## 2011-06-06 NOTE — ED Notes (Signed)
Pt states she had injured her low back in the past. States she re injured it last night at work

## 2011-06-06 NOTE — ED Provider Notes (Signed)
History     CSN: [id number] Arrival date & time: 06/06/2011  8:21 AM   First MD Initiated Contact with Patient 06/06/11 [phone]      Chief Complaint  Patient presents with  . Back Pain    (Consider location/radiation/quality/duration/timing/severity/associated sxs/prior treatment) HPI Comments: Pt injured her L lower back lifting a tray and twisting at work on 04-18-11,  She has been going to therapy with considerable improvement in her pain.  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  Recent dc studies include XR's and an MRI.  She requested an orthopedic referral at the urgent care where she has been seen and they refused.  Her employer told her to come here instead.  Patient is a 50 y.o. female presenting with back pain. The history is provided by the patient. No language interpreter was used.  Back Pain  This is a recurrent problem. The current episode started yesterday. The problem occurs constantly. The problem has been gradually worsening. The pain is present in the lumbar spine. The quality of the pain is described as stabbing. The pain does not radiate. The pain is at a severity of 7/10. The treatment provided no relief. Risk factors include obesity.    Past Medical History  Diagnosis Date  . Depression   . High cholesterol   . Hypertension   . Hemorrhoids     Past Surgical History  Procedure Date  . Abdominal hysterectomy     No family history on file.  History  Substance Use Topics  . Smoking status: Not on file  . Smokeless tobacco: Not on file  . Alcohol Use: No    OB History    Grav Para Term Preterm Abortions TAB SAB Ect Mult Living                  Review of Systems  Musculoskeletal: Positive for back pain.  All other systems reviewed and are negative.    Allergies  Review of patient's allergies indicates no known allergies.  Home Medications   Current Outpatient Rx  Name  Route Sig Dispense Refill  . AMLODIPINE BESYLATE 10 MG PO TABS Oral Take 10 mg by mouth daily.      . ASPIRIN 81 MG PO TABS Oral Take 81 mg by mouth daily.      . ATORVASTATIN CALCIUM 20 MG PO TABS Oral Take 20 mg by mouth daily.      Marland Kitchen BIOTIN 5000 MCG PO TABS Oral Take by mouth.      Marland Kitchen GARLIC 10 MG PO CAPS Oral Take by mouth.      Marland Kitchen HYDROCHLOROTHIAZIDE 25 MG PO TABS Oral Take 25 mg by mouth daily.      Marland Kitchen LOSARTAN POTASSIUM 100 MG PO TABS Oral Take 100 mg by mouth daily.      Marland Kitchen OMEPRAZOLE 40 MG PO CPDR Oral Take 40 mg by mouth daily.        BP 146/94  Pulse 79  Temp(Src) 98.3 F (36.8 C) (Oral)  Resp 16  Ht 5\' 2"  (1.575 m)  Wt 162 lb (73.483 kg)  BMI 29.63 kg/m2  SpO2 98%  Physical Exam  Nursing note and vitals reviewed. Constitutional: She is oriented to person, place, and time. She appears well-developed and well-nourished. No distress.  HENT:  Head: Normocephalic and atraumatic.  Eyes: EOM are normal.  Neck: Normal range of motion.  Cardiovascular: Normal rate, regular  rhythm and normal heart sounds.   Pulmonary/Chest: Effort normal and breath sounds normal.  Abdominal: Soft. She exhibits no distension. There is no tenderness.  Musculoskeletal: She exhibits tenderness.       Lumbar back: She exhibits decreased range of motion, tenderness and pain. She exhibits no bony tenderness, no swelling, no edema, no deformity, no laceration, no spasm and normal pulse.       Back:       Arms: Neurological: She is alert and oriented to person, place, and time.  Skin: Skin is warm and dry.  Psychiatric: She has a normal mood and affect. Judgment normal.    ED Course  Procedures (including critical care time)  Labs Reviewed - No data to display No results found.   No diagnosis found.    MDM  pt will be referred to an orthopedist        Duaine Dredge, [place] 06/06/11 [phone]

## 2011-06-06 NOTE — ED Provider Notes (Signed)
Medical screening examination/treatment/procedure(s) were performed by non-physician practitioner and as supervising physician I was immediately available for consultation/collaboration.  Mervin Kung, MD 06/06/11 [phone]

## 2011-06-07 ENCOUNTER — Ambulatory Visit (INDEPENDENT_AMBULATORY_CARE_PROVIDER_SITE_OTHER): Payer: Self-pay | Admitting: Internal Medicine

## 2011-07-12 ENCOUNTER — Emergency Department (HOSPITAL_COMMUNITY)
Admission: EM | Admit: 2011-07-12 | Discharge: 2011-07-12 | Disposition: A | Discharge status: Home / Self Care | Attending: Emergency Medicine | Admitting: Emergency Medicine

## 2011-07-12 ENCOUNTER — Encounter (HOSPITAL_COMMUNITY): Payer: Self-pay | Admitting: *Deleted

## 2011-07-12 DIAGNOSIS — K649 Unspecified hemorrhoids: Secondary | ICD-10-CM

## 2011-07-12 DIAGNOSIS — K921 Melena: Secondary | ICD-10-CM | POA: Insufficient documentation

## 2011-07-12 DIAGNOSIS — K644 Residual hemorrhoidal skin tags: Secondary | ICD-10-CM | POA: Insufficient documentation

## 2011-07-12 DIAGNOSIS — Z7982 Long term (current) use of aspirin: Secondary | ICD-10-CM | POA: Insufficient documentation

## 2011-07-12 DIAGNOSIS — I10 Essential (primary) hypertension: Secondary | ICD-10-CM | POA: Insufficient documentation

## 2011-07-12 DIAGNOSIS — R109 Unspecified abdominal pain: Secondary | ICD-10-CM | POA: Insufficient documentation

## 2011-07-12 DIAGNOSIS — F329 Major depressive disorder, single episode, unspecified: Secondary | ICD-10-CM | POA: Insufficient documentation

## 2011-07-12 DIAGNOSIS — K922 Gastrointestinal hemorrhage, unspecified: Secondary | ICD-10-CM | POA: Insufficient documentation

## 2011-07-12 DIAGNOSIS — E7800 Pure hypercholesterolemia, unspecified: Secondary | ICD-10-CM | POA: Insufficient documentation

## 2011-07-12 DIAGNOSIS — F3289 Other specified depressive episodes: Secondary | ICD-10-CM | POA: Insufficient documentation

## 2011-07-12 LAB — DIFFERENTIAL
Basophils Absolute: 0 10*3/uL (ref 0.0–0.1)
Basophils Relative: 1 % (ref 0–1)
Eosinophils Absolute: 0.2 10*3/uL (ref 0.0–0.7)
Eosinophils Relative: 3 % (ref 0–5)
Lymphocytes Relative: 32 % (ref 12–46)
Lymphs Abs: 2.5 10*3/uL (ref 0.7–4.0)
Monocytes Absolute: 0.5 10*3/uL (ref 0.1–1.0)
Monocytes Relative: 6 % (ref 3–12)
Neutro Abs: 4.6 10*3/uL (ref 1.7–7.7)
Neutrophils Relative %: 59 % (ref 43–77)

## 2011-07-12 LAB — URINALYSIS, ROUTINE W REFLEX MICROSCOPIC
Bilirubin Urine: NEGATIVE
Glucose, UA: NEGATIVE mg/dL
Ketones, ur: NEGATIVE mg/dL
Leukocytes, UA: NEGATIVE
Nitrite: NEGATIVE
Protein, ur: 30 mg/dL — AB
Specific Gravity, Urine: 1.025 (ref 1.005–1.030)
Urobilinogen, UA: 0.2 mg/dL (ref 0.0–1.0)
pH: 6 (ref 5.0–8.0)

## 2011-07-12 LAB — CBC
HCT: 38.6 % (ref 36.0–46.0)
Hemoglobin: 12.7 g/dL (ref 12.0–15.0)
MCH: 28.4 pg (ref 26.0–34.0)
MCHC: 32.9 g/dL (ref 30.0–36.0)
MCV: 86.4 fL (ref 78.0–100.0)
Platelets: 324 10*3/uL (ref 150–400)
RBC: 4.47 MIL/uL (ref 3.87–5.11)
RDW: 16.1 % — ABNORMAL HIGH (ref 11.5–15.5)
WBC: 7.9 10*3/uL (ref 4.0–10.5)

## 2011-07-12 LAB — URINE MICROSCOPIC-ADD ON

## 2011-07-12 MED ORDER — OXYCODONE-ACETAMINOPHEN 5-325 MG PO TABS: 1.0000 | ORAL_TABLET | ORAL | Status: AC | PRN

## 2011-07-12 NOTE — ED Provider Notes (Signed)
History     CSN: [id number] Arrival date & time: 07/12/2011 12:54 PM   First MD Initiated Contact with Patient 07/12/11 1359      Chief Complaint  Patient presents with  . Hemorrhoids    (Consider location/radiation/quality/duration/timing/severity/associated sxs/prior treatment) Patient is a 50 y.o. female presenting with hematochezia. The history is provided by the patient.  Rectal Bleeding  The current episode started more than 1 week ago. Associated symptoms include abdominal pain and hemorrhoids. Pertinent negatives include no fever, no diarrhea, no nausea, no rectal pain, no hematuria and no rash.   patient states she's had blood with her stool and blood on wiping for the last several days. Sometimes she states it flows out. She thinks is rectal but doesn't know for sure. No vaginal bleeding. She states it is worse with wiping. She states she has normal stool. She states she's had back pain, but she thinks it may have been from her injury. She was on therapy for that. She had been on some pain medications, but states she's hardly using it now. No other bleeding. The lightheadedness or dizziness. She states she's upper abdominal pain it goes for back. No weight loss.  Past Medical History  Diagnosis Date  . Depression   . High cholesterol   . Hypertension   . Hemorrhoids     Past Surgical History  Procedure Date  . Abdominal hysterectomy     History reviewed. No pertinent family history.  History  Substance Use Topics  . Smoking status: Not on file  . Smokeless tobacco: Not on file  . Alcohol Use: No    OB History    Grav Para Term Preterm Abortions TAB SAB Ect Mult Living                  Review of Systems  Constitutional: Negative for fever, chills and fatigue.  HENT: Negative for nosebleeds and tinnitus.   Respiratory: Negative for choking.   Gastrointestinal: Positive for abdominal pain, blood in stool, hematochezia and hemorrhoids. Negative for nausea,  diarrhea, constipation and rectal pain.  Genitourinary: Negative for hematuria and menstrual problem.  Skin: Negative for pallor and rash.  Neurological: Negative for dizziness and light-headedness.  Hematological: Negative for adenopathy. Does not bruise/bleed easily.    Allergies  Review of patient's allergies indicates no known allergies.  Home Medications   Current Outpatient Rx  Name Route Sig Dispense Refill  . AMLODIPINE BESYLATE 10 MG PO TABS Oral Take 10 mg by mouth daily.      . ASPIRIN EC 81 MG PO TBEC Oral Take 81 mg by mouth daily.      . ATORVASTATIN CALCIUM 20 MG PO TABS Oral Take 20 mg by mouth at bedtime.     Marland Kitchen BIOTIN 5000 MCG PO TABS Oral Take 1 tablet by mouth daily as needed. Hair,nails    . CELECOXIB 200 MG PO CAPS Oral Take 200 mg by mouth daily.      Marland Kitchen GARLIC 10 MG PO CAPS Oral Take 1 tablet by mouth daily as needed. Blood pressure    . LOSARTAN POTASSIUM 100 MG PO TABS Oral Take 100 mg by mouth daily.      . OXYCODONE-ACETAMINOPHEN 5-325 MG PO TABS Oral Take 1 tablet by mouth every 4 (four) hours as needed for pain. 15 tablet 0    BP 161/92  Pulse 84  Temp(Src) 97.6 F (36.4 C) (Oral)  Resp 18  Ht 5\' 2"  (1.575 m)  Wt 168 lb (  76.204 kg)  BMI 30.73 kg/m2  SpO2 100%  Physical Exam  Nursing note and vitals reviewed. Constitutional: She is oriented to person, place, and time. She appears well-developed and well-nourished.  HENT:  Head: Normocephalic and atraumatic.  Neck: Normal range of motion. Neck supple.  Cardiovascular: Normal rate, regular rhythm and normal heart sounds.   No murmur heard. Pulmonary/Chest: Effort normal and breath sounds normal. No respiratory distress. She has no wheezes. She has no rales.  Abdominal: Soft. Bowel sounds are normal. She exhibits no distension. There is no tenderness. There is no rebound and no guarding.  Musculoskeletal: Normal range of motion.  Neurological: She is alert and oriented to person, place, and time.  No cranial nerve deficit.  Skin: Skin is warm and dry.  Psychiatric: She has a normal mood and affect. Her speech is normal.    ED Course  Procedures (including critical care time)  Labs Reviewed  CBC - Abnormal; Notable for the following:    RDW 16.1 (*)    All other components within normal limits  URINALYSIS, ROUTINE W REFLEX MICROSCOPIC - Abnormal; Notable for the following:    Hgb urine dipstick TRACE (*)    Protein, ur 30 (*)    All other components within normal limits  DIFFERENTIAL  URINE MICROSCOPIC-ADD ON   No results found.   1. GI bleeding   2. Hemorrhoid       MDM  Patient presents with blood in the toilet with her wiping. Her hemoglobin is normal. She states it is now only with wiping. She does have some external hemorrhoids on exam. She did have mild frank red blood, but no clear source. He was more external than on the internal rectal exam. The blood could be from hemorrhoids or it could be something anterior. She will followup with GI. He does not appear to be hemodynamically significant right now.         Jasper Riling. Alvino Chapel, MD 07/12/11 1615

## 2011-07-12 NOTE — ED Notes (Signed)
Pt c/o painful hemorrhoids and bleeding. Pt states blood is bright red from her anus.

## 2011-07-13 ENCOUNTER — Ambulatory Visit (INDEPENDENT_AMBULATORY_CARE_PROVIDER_SITE_OTHER): Admitting: Gastroenterology

## 2011-07-13 ENCOUNTER — Encounter: Payer: Self-pay | Admitting: Gastroenterology

## 2011-07-13 VITALS — BP 148/92 | HR 87 | Temp 97.8°F | Ht 62.0 in | Wt 169.0 lb

## 2011-07-13 DIAGNOSIS — K921 Melena: Secondary | ICD-10-CM

## 2011-07-13 DIAGNOSIS — R1013 Epigastric pain: Secondary | ICD-10-CM | POA: Insufficient documentation

## 2011-07-13 LAB — COMPREHENSIVE METABOLIC PANEL
ALT: 18 U/L (ref 0–35)
AST: 19 U/L (ref 0–37)
Albumin: 4.1 g/dL (ref 3.5–5.2)
Alkaline Phosphatase: 88 U/L (ref 39–117)
BUN: 20 mg/dL (ref 6–23)
CO2: 25 mEq/L (ref 19–32)
Calcium: 9.1 mg/dL (ref 8.4–10.5)
Chloride: 106 mEq/L (ref 96–112)
Creat: 1.28 mg/dL — ABNORMAL HIGH (ref 0.50–1.10)
Glucose, Bld: 102 mg/dL — ABNORMAL HIGH (ref 70–99)
Potassium: 3.6 mEq/L (ref 3.5–5.3)
Sodium: 141 mEq/L (ref 135–145)
Total Bilirubin: 0.6 mg/dL (ref 0.3–1.2)
Total Protein: 7.4 g/dL (ref 6.0–8.3)

## 2011-07-13 LAB — LIPASE: Lipase: 19 U/L (ref 0–75)

## 2011-07-13 MED ORDER — ESOMEPRAZOLE MAGNESIUM 40 MG PO CPDR: 40.0000 mg | DELAYED_RELEASE_CAPSULE | Freq: Every day | ORAL | Status: DC

## 2011-07-13 MED ORDER — HYDROCORTISONE ACETATE 25 MG RE SUPP: 25.0000 mg | Freq: Two times a day (BID) | RECTAL | Status: DC

## 2011-07-13 NOTE — Assessment & Plan Note (Addendum)
50 year old female with intermittent hematochezia in the setting of Celebrex, without any change in bowel habits, constipation, or diarrhea. Notable external hemorrhoids on rectal exam, slight discomfort on internal exam. Do not have last year's colonoscopy but have requested. Likely benign anorectal source; question NSAID irritation. CBC normal, no anemia.   Treat with anusol suppositories X 10 days STOP CELEBREX Obtain last colonoscopy report (received Dec 3: large hemorrhoids, benign polyp) Follow-up in 4 weeks or sooner if necessary

## 2011-07-13 NOTE — Patient Instructions (Signed)
Stop Celebrex for now. Start taking Nexium 30 minutes before breakfast each day. We have provided samples for you and sent a prescription to your pharmacy.  Please have blood work completed. We will call you with these results when they are available.  Take the suppositories twice a day for 10 days. Monitor for any increased bleeding or pain.   Start following a high fiber diet.   We will see you back in 4 weeks. If you have any problems in the meantime, please call our office.

## 2011-07-13 NOTE — Progress Notes (Signed)
Cc to PCP 

## 2011-07-13 NOTE — Assessment & Plan Note (Signed)
Episode of epigastric pain about one month ago, no significant change with Prilosec, no with recurrence since yesterday. No precipitating or relieving factors. Gallbladder in situ, also has been taking Celebrex X 1 month. Will stop Celebrex, start Nexium, and obtain a CMP and Lipase for our files. 4 week f/u. Consider EGD if necessary.

## 2011-07-13 NOTE — Progress Notes (Addendum)
Primary Care Physician:  Odette Fraction, MD, MD Primary Gastroenterologist:  Dr. [name]  Chief Complaint  Patient presents with  . Hemorrhoids    HPI:   [name] presents today as a self-referral secondary to hematochezia and possible hemorrhoids. She was seen in the ED last night with hematochezia. She had a colonoscopy last year in [place], but she is unsure who completed this. We have requested the results. Reports intermittent hematochezia since Nov 18th. NO constipation or diarrhea. No lower abdominal pain, has BM every day. No N/V, lack of appetite, wt loss. No fever or chills. Reports lower back pain, which resulted from work-related injury. She has completed therapy for this. Reports intermittent epigastric pain, sharp, about one month ago. Was referred by PCP to Dr. Laural Golden, but pain resolved prior to appt so she cancelled. Now states epigastric pain has restarted since yesterday.  Complains of rectal pruritis, pain, burning. Vague symptoms of vagina "aching". Has not tried any OTC preparations for hemorrhoids.    Has been taking Celebrex since October 31. No other NSAIDs or aspirin powders. CBC without anemia.   Past Medical History  Diagnosis Date  . High cholesterol   . Hypertension   . Hemorrhoids   . S/P colonoscopy Jan 2011    Dr. Benson Norway: sessile polyp (benign lymphoid), large hemorrhoids, repeat 5-10 years    Past Surgical History  Procedure Date  . Abdominal hysterectomy     Current Outpatient Prescriptions  Medication Sig Dispense Refill  . amLODipine (NORVASC) 10 MG tablet Take 10 mg by mouth daily.        Marland Kitchen aspirin EC 81 MG tablet Take 81 mg by mouth daily.        Marland Kitchen atorvastatin (LIPITOR) 20 MG tablet Take 20 mg by mouth at bedtime.       . Biotin 5000 MCG TABS Take 1 tablet by mouth daily as needed. Hair,nails      . celecoxib (CELEBREX) 200 MG capsule Take 200 mg by mouth daily.        . Garlic 10 MG CAPS Take 1 tablet by mouth daily as needed.  Blood pressure      . losartan (COZAAR) 100 MG tablet Take 100 mg by mouth daily.        Marland Kitchen oxyCODONE-acetaminophen (PERCOCET) 5-325 MG per tablet Take 1 tablet by mouth every 4 (four) hours as needed for pain.  15 tablet  0  . esomeprazole (NEXIUM) 40 MG capsule Take 1 capsule (40 mg total) by mouth daily.  14 capsule  0  . hydrocortisone (ANUSOL-HC) 25 MG suppository Place 1 suppository (25 mg total) rectally every 12 (twelve) hours. For 10 days  20 suppository  0    Allergies as of 07/13/2011  . (No Known Allergies)    Family History  Problem Relation Age of Onset  . Colon cancer Neg Hx     History   Social History  . Marital Status: Married    Spouse Name: N/A    Number of Children: N/A  . 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 Smoking status: Never Smoker   . Smokeless tobacco: Not on file  . Alcohol Use: No  . Drug Use: No  . Sexually Active: Not on file   Other Topics Concern  . Not on file   Social History Narrative  . No narrative on file    Review of Systems: Gen: Denies any  fever, chills, fatigue, weight loss, lack of appetite.  CV: Denies chest pain, heart palpitations, peripheral edema, syncope.  Resp: Denies shortness of breath at rest or with exertion. Denies wheezing or cough.  GI: SEE HPI GU : Denies urinary burning, urinary frequency, urinary hesitancy MS: Denies joint pain, muscle weakness, cramps, or limitation of movement.  Derm: Denies rash, itching, dry skin Psych: Denies depression, anxiety, memory loss, and confusion Heme: Denies bruising, bleeding, and enlarged lymph nodes.  Physical Exam: BP 148/92  Pulse 87  Temp(Src) 97.8 F (36.6 C) (Temporal)  Ht 5\' 2"  (1.575 m)  Wt 169 lb (76.658 kg)  BMI 30.91 kg/m2 General:   Alert and oriented. Pleasant and cooperative. Well-nourished and well-developed.  Head:  Normocephalic and atraumatic. Eyes:  Without icterus, sclera clear and  conjunctiva pink.  Ears:  Normal auditory acuity. Nose:  No deformity, discharge,  or lesions. Mouth:  No deformity or lesions, oral mucosa pink.  Neck:  Supple, without mass or thyromegaly. Lungs:  Clear to auscultation bilaterally. No wheezes, rales, or rhonchi. No distress.  Heart:  S1, S2 present without murmurs appreciated.  Abdomen:  +BS, soft, non-tender and non-distended. No HSM noted. No guarding or rebound. No masses appreciated.  Rectal:  Several large external hemorrhoids but no thrombosed hemorrhoids noted. No gross blood noted, good sphincter tone, mild discomfort on internal exam, no mass noted.  Msk:  Symmetrical without gross deformities. Normal posture. Extremities:  Without clubbing or edema. Neurologic:  Alert and  oriented x4;  grossly normal neurologically. Skin:  Intact without significant lesions or rashes. Cervical Nodes:  No significant cervical adenopathy. Psych:  Alert and cooperative. Normal mood and affect.

## 2011-07-14 ENCOUNTER — Ambulatory Visit (HOSPITAL_COMMUNITY)
Admission: RE | Admit: 2011-07-14 | Discharge: 2011-07-14 | Disposition: A | Discharge status: Home / Self Care | Source: Ambulatory Visit | Attending: Family Medicine | Admitting: Family Medicine

## 2011-07-14 DIAGNOSIS — Z139 Encounter for screening, unspecified: Secondary | ICD-10-CM

## 2011-07-14 DIAGNOSIS — Z1231 Encounter for screening mammogram for malignant neoplasm of breast: Secondary | ICD-10-CM | POA: Insufficient documentation

## 2011-07-17 ENCOUNTER — Encounter: Payer: Self-pay | Admitting: Gastroenterology

## 2011-07-17 ENCOUNTER — Ambulatory Visit: Admitting: Gastroenterology

## 2011-07-17 NOTE — Progress Notes (Signed)
Quick Note:  Lipase normal. Creatinine mildly elevated. Appears this is not new finding. Unsure if pt has hx of kidney issues in past or not.  Please cc PCP the labs. ______

## 2011-07-19 ENCOUNTER — Telehealth: Payer: Self-pay

## 2011-07-19 NOTE — Progress Notes (Signed)
Quick Note:  Pt aware, she said she doesn't have any history of kidney problems and doesn't feel dehydrated. Advised pt to increased fluids and to see her pcp.   Please cc pcp ______

## 2011-07-19 NOTE — Telephone Encounter (Signed)
Spoke with pt- she said she has been using the suppositories x 5 days and is still having rectal bleeding with wiping and her hemorrhoids hurt. She is having normal bms and it not straining. Pt wants to know how long is it going to take for the suppositories to start working? And is there anything else she needs to be doing.

## 2011-07-19 NOTE — Progress Notes (Signed)
Quick Note:  Tried to call pt- LMOM ______ 

## 2011-07-20 NOTE — Telephone Encounter (Signed)
Please schedule appt

## 2011-07-20 NOTE — Progress Notes (Signed)
Results Cc to PCP  

## 2011-07-20 NOTE — Telephone Encounter (Signed)
Should be getting better. Need office visit. May need referral to [facility] at that time once assessed.

## 2011-07-25 NOTE — Telephone Encounter (Signed)
Pt is aware of OV on 12/27 at 230 with AS

## 2011-07-31 ENCOUNTER — Ambulatory Visit (INDEPENDENT_AMBULATORY_CARE_PROVIDER_SITE_OTHER): Admitting: Gastroenterology

## 2011-07-31 ENCOUNTER — Encounter: Payer: Self-pay | Admitting: Gastroenterology

## 2011-07-31 ENCOUNTER — Telehealth: Payer: Self-pay | Admitting: Gastroenterology

## 2011-07-31 VITALS — BP 148/93 | HR 82 | Temp 97.4°F | Ht 62.0 in | Wt 167.2 lb

## 2011-07-31 DIAGNOSIS — R1013 Epigastric pain: Secondary | ICD-10-CM

## 2011-07-31 DIAGNOSIS — K921 Melena: Secondary | ICD-10-CM

## 2011-07-31 DIAGNOSIS — K644 Residual hemorrhoidal skin tags: Secondary | ICD-10-CM

## 2011-07-31 DIAGNOSIS — R197 Diarrhea, unspecified: Secondary | ICD-10-CM

## 2011-07-31 MED ORDER — HYDROCORTISONE ACETATE 25 MG RE SUPP: 25.0000 mg | Freq: Two times a day (BID) | RECTAL | Status: DC

## 2011-07-31 NOTE — Patient Instructions (Signed)
Please have stool studies completed. We will be calling you with the results when they are available.  I have sent in 1 refill for suppositories just for 7 days.  You may take imodium as needed for the diarrhea. Avoid dairy products right now. Please see handout.   You do not need to continue taking Nexium or Prilosec. However, if you note any reflux, upper belly pain, etc, please call our office. Avoid taking Ibuprofen, Aleve, Advil, Motrin, Celebrex, or things of that nature. Tylenol is safe to take.  We will see you back in 3 months!

## 2011-07-31 NOTE — Telephone Encounter (Signed)
Pt was seen today and will follow up in 3 months. Do you still want her to keep her OV for 12/27? Please advise

## 2011-07-31 NOTE — Progress Notes (Signed)
Referring Provider: Odette Fraction, MD Primary Care Physician:  Odette Fraction, MD, MD Primary Gastroenterologist: Dr. [name]   Chief Complaint  Patient presents with  . Hemorrhoids    HPI:   Megan [name] is a 50 year old female who presents today in f/u for hemorrhoids. Her last colonoscopy was in [place] in Jan 2011 by Dr. Benson Norway. Findings of benign lymphoid sessile polyp, large hemorrhoids. She was seen by myself on Nov 29th with intermittent hematochezia. No change in bowel habits, abdominal pain, other concerning symptoms. She did note an episode of epigastric pain that had occurred about a month prior during this visit, in the presence of Celebrex daily. She was instructed to stop Celebrex. Nexium was started instead of Prilosec. CMP and Lipase were drawn for our records. Creatinine was mildly elevated but no other abnormalities.  Rectal exam at that time revealed several large external hemorrhoids but no thrombosed hemorrhoids. She was given a course of Anusol suppositories.   Returns today with no further episodes of epigastric pain. States hematochezia is better. Reports diarrhea since last Thursday. No sick contacts. No recent abx. Unsure about fever or chills. States at night she is sweating but feels cold. Averaging about 3 loose stools per day. Not related to eating/drinking. Has been out of Nexium since last Wednesday or Thursday. No reflux symptoms. No N/V.   Complains of rectal discomfort, intermittent. States she was told she should have a hemorrhoidectomy in the past, but she did not proceed at that time.    Past Medical History  Diagnosis Date  . High cholesterol   . Hypertension   . Hemorrhoids   . S/P colonoscopy Jan 2011    Dr. Benson Norway: sessile polyp (benign lymphoid), large hemorrhoids, repeat 5-10 years    Past Surgical History  Procedure Date  . Abdominal hysterectomy     Current Outpatient Prescriptions  Medication Sig Dispense Refill  . amLODipine  (NORVASC) 10 MG tablet Take 10 mg by mouth daily.        Marland Kitchen aspirin EC 81 MG tablet Take 81 mg by mouth daily.        Marland Kitchen atorvastatin (LIPITOR) 20 MG tablet Take 20 mg by mouth at bedtime.       . Biotin 5000 MCG TABS Take 1 tablet by mouth daily as needed. Hair,nails      . celecoxib (CELEBREX) 200 MG capsule Take 200 mg by mouth daily.        Marland Kitchen esomeprazole (NEXIUM) 40 MG capsule Take 1 capsule (40 mg total) by mouth daily.  14 capsule  0  . Garlic 10 MG CAPS Take 1 tablet by mouth daily as needed. Blood pressure      . losartan (COZAAR) 100 MG tablet Take 100 mg by mouth daily.        . hydrocortisone (ANUSOL-HC) 25 MG suppository Place 1 suppository (25 mg total) rectally every 12 (twelve) hours. For 10 days  20 suppository  0    Allergies as of 07/31/2011  . (No Known Allergies)    Family History  Problem Relation Age of Onset  . Colon cancer Neg Hx     History   Social History  . Marital Status: Married    Spouse Name: N/A    Number of Children: N/A  . 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 Smoking status: Never Smoker   . Smokeless tobacco: None  . Alcohol Use: No  .  Drug Use: No  . Sexually Active: None   Other Topics Concern  . None   Social History Narrative  . None    Review of Systems: Gen: Denies fever, chills, anorexia. Denies fatigue, weakness, weight loss.  CV: Denies chest pain, palpitations, syncope, peripheral edema, and claudication. Resp: Denies dyspnea at rest, cough, wheezing, coughing up blood, and pleurisy. GI: Denies vomiting blood, jaundice, and fecal incontinence.   Denies dysphagia or odynophagia. Derm: Denies rash, itching, dry skin Psych: Denies depression, anxiety, memory loss, confusion. No homicidal or suicidal ideation.  Heme: Denies bruising, bleeding, and enlarged lymph nodes.  Physical Exam: BP 148/93  Pulse 82  Temp(Src) 97.4 F (36.3 C) (Temporal)  Ht 5\' 2"  (1.575 m)   Wt 167 lb 3.2 oz (75.841 kg)  BMI 30.58 kg/m2 General:   Alert and oriented. No distress noted. Pleasant and cooperative.  Head:  Normocephalic and atraumatic. Eyes:  Conjuctiva clear without scleral icterus. Mouth:  Oral mucosa pink and moist. Good dentition. No lesions. Heart:  S1, S2 present without murmurs, rubs, or gallops. Regular rate and rhythm. Abdomen:  +BS, soft, non-tender and non-distended. No rebound or guarding. No HSM or masses noted. Rectal: multiple large, non-thrombosed hemorrhoids, tender to touch. Internal exam not performed (performed at last visit). No gross blood noted. No other abnormalities.  Msk:  Symmetrical without gross deformities. Normal posture. Extremities:  Without edema. Neurologic:  Alert and  oriented x4;  grossly normal neurologically. Skin:  Intact without significant lesions or rashes. Psych:  Alert and cooperative. Normal mood and affect.

## 2011-08-01 DIAGNOSIS — R197 Diarrhea, unspecified: Secondary | ICD-10-CM | POA: Insufficient documentation

## 2011-08-01 DIAGNOSIS — K644 Residual hemorrhoidal skin tags: Secondary | ICD-10-CM | POA: Insufficient documentation

## 2011-08-01 NOTE — Assessment & Plan Note (Signed)
Greatly improved.  

## 2011-08-01 NOTE — Assessment & Plan Note (Signed)
Hx of one episode of epigastric pain. Resolved. Avoid NSAIDs if possible. No reflux symptoms, dysphagia, N/V. No need for PPI at this time. If any further issues, pt is to contact us.

## 2011-08-01 NOTE — Telephone Encounter (Signed)
No, we can cancel the appt for 12/27. We will see her in 3 mos. Thanks!

## 2011-08-01 NOTE — Assessment & Plan Note (Signed)
Large external hemorrhoids, multiple, noted on physical exam. Pt notes discomfort. Chronic in nature. Recent colonoscopy on file. Will refill suppositories X 7 more doses. Refer to Gen Surg for consideration of elective hemorrhoidectomy.

## 2011-08-01 NOTE — Assessment & Plan Note (Addendum)
50 year old female with new onset of diarrhea since last Thursday. Question self-limiting. Will assess with stool studies, institute lactose-free diet. Imodium prn.   Return in 3 mos or sooner if needed.

## 2011-08-02 LAB — GIARDIA ANTIGEN: Giardia Screen (EIA): NEGATIVE

## 2011-08-02 LAB — CLOSTRIDIUM DIFFICILE BY PCR: Toxigenic C. Difficile by PCR: NOT DETECTED

## 2011-08-02 LAB — FECAL LACTOFERRIN, QUANT: Lactoferrin: POSITIVE

## 2011-08-02 NOTE — Progress Notes (Signed)
Cc to PCP 

## 2011-08-04 NOTE — Progress Notes (Signed)
Quick Note:  Thus far, stool culture still pending, Cdiff and Giardia negative. She does have positive lactoferrin, which is really non-specific in this case.   Need to have her continue low-fat, lactose-free diet. How is diarrhea? Imodium prn Add probiotic if not already doing so  PR next week for assessment of diarrhea ______

## 2011-08-05 LAB — STOOL CULTURE

## 2011-08-09 NOTE — Progress Notes (Signed)
Quick Note:  Called and spoke to pt. Denies any diarrhea or constipation currently. Still has not been referred to Dr. Arnoldo Morale as requested for hemorrhoids.  Please find out status of this referral. Thanks! ______

## 2011-08-09 NOTE — Telephone Encounter (Signed)
Pt is aware of OV on 2/4 at 0830 with AS

## 2011-08-10 ENCOUNTER — Ambulatory Visit: Admitting: Gastroenterology

## 2011-08-19 ENCOUNTER — Emergency Department (HOSPITAL_COMMUNITY)

## 2011-08-19 ENCOUNTER — Emergency Department (HOSPITAL_COMMUNITY)
Admission: EM | Admit: 2011-08-19 | Discharge: 2011-08-19 | Disposition: A | Discharge status: Home / Self Care | Attending: Emergency Medicine | Admitting: Emergency Medicine

## 2011-08-19 ENCOUNTER — Encounter (HOSPITAL_COMMUNITY): Payer: Self-pay | Admitting: *Deleted

## 2011-08-19 ENCOUNTER — Other Ambulatory Visit: Payer: Self-pay

## 2011-08-19 DIAGNOSIS — I10 Essential (primary) hypertension: Secondary | ICD-10-CM | POA: Insufficient documentation

## 2011-08-19 DIAGNOSIS — Z7982 Long term (current) use of aspirin: Secondary | ICD-10-CM | POA: Insufficient documentation

## 2011-08-19 DIAGNOSIS — K649 Unspecified hemorrhoids: Secondary | ICD-10-CM | POA: Insufficient documentation

## 2011-08-19 DIAGNOSIS — R079 Chest pain, unspecified: Secondary | ICD-10-CM | POA: Insufficient documentation

## 2011-08-19 DIAGNOSIS — E876 Hypokalemia: Secondary | ICD-10-CM

## 2011-08-19 DIAGNOSIS — R011 Cardiac murmur, unspecified: Secondary | ICD-10-CM | POA: Insufficient documentation

## 2011-08-19 LAB — URINE MICROSCOPIC-ADD ON

## 2011-08-19 LAB — BASIC METABOLIC PANEL
BUN: 15 mg/dL (ref 6–23)
CO2: 30 mEq/L (ref 19–32)
Calcium: 9.9 mg/dL (ref 8.4–10.5)
Chloride: 99 mEq/L (ref 96–112)
Creatinine, Ser: 1.24 mg/dL — ABNORMAL HIGH (ref 0.50–1.10)
GFR calc Af Amer: 58 mL/min — ABNORMAL LOW (ref >90)
GFR calc non Af Amer: 50 mL/min — ABNORMAL LOW (ref >90)
Glucose, Bld: 117 mg/dL — ABNORMAL HIGH (ref 70–99)
Potassium: 2.9 mEq/L — ABNORMAL LOW (ref 3.5–5.1)
Sodium: 136 mEq/L (ref 135–145)

## 2011-08-19 LAB — URINALYSIS, ROUTINE W REFLEX MICROSCOPIC
Bilirubin Urine: NEGATIVE
Glucose, UA: NEGATIVE mg/dL
Ketones, ur: NEGATIVE mg/dL
Leukocytes, UA: NEGATIVE
Nitrite: NEGATIVE
Protein, ur: 30 mg/dL — AB
Specific Gravity, Urine: 1.01 (ref 1.005–1.030)
Urobilinogen, UA: 0.2 mg/dL (ref 0.0–1.0)
pH: 6 (ref 5.0–8.0)

## 2011-08-19 LAB — CBC
HCT: 42 % (ref 36.0–46.0)
Hemoglobin: 13.3 g/dL (ref 12.0–15.0)
MCH: 27.3 pg (ref 26.0–34.0)
MCHC: 31.7 g/dL (ref 30.0–36.0)
MCV: 86.1 fL (ref 78.0–100.0)
Platelets: 330 10*3/uL (ref 150–400)
RBC: 4.88 MIL/uL (ref 3.87–5.11)
RDW: 16.1 % — ABNORMAL HIGH (ref 11.5–15.5)
WBC: 6.2 10*3/uL (ref 4.0–10.5)

## 2011-08-19 LAB — POCT I-STAT TROPONIN I: Troponin i, poc: 0.01 ng/mL (ref 0.00–0.08)

## 2011-08-19 LAB — TROPONIN I: Troponin I: <0.3 ng/mL (ref <0.30)

## 2011-08-19 MED ORDER — HYDROCHLOROTHIAZIDE 25 MG PO TABS
12.5000 mg | ORAL_TABLET | Freq: Every day | ORAL | Status: DC
  Filled 2011-08-19 (×2): qty 0.5

## 2011-08-19 MED ORDER — LABETALOL HCL 100 MG PO TABS
100.0000 mg | ORAL_TABLET | Freq: Once | ORAL | Status: DC
  Filled 2011-08-19: qty 1

## 2011-08-19 MED ORDER — POTASSIUM CHLORIDE CRYS ER 20 MEQ PO TBCR: 20.0000 meq | EXTENDED_RELEASE_TABLET | Freq: Every day | ORAL | Status: DC

## 2011-08-19 MED ORDER — POTASSIUM CHLORIDE CRYS ER 20 MEQ PO TBCR
40.0000 meq | EXTENDED_RELEASE_TABLET | Freq: Once | ORAL | Status: AC
  Administered 2011-08-19: 40 meq via ORAL
  Filled 2011-08-19 (×2): qty 1

## 2011-08-19 MED ORDER — POTASSIUM CHLORIDE 10 MEQ/100ML IV SOLN
10.0000 meq | Freq: Once | INTRAVENOUS | Status: AC
  Administered 2011-08-19: 10 meq via INTRAVENOUS
  Filled 2011-08-19: qty 100

## 2011-08-19 MED ORDER — POTASSIUM CHLORIDE 20 MEQ/15ML (10%) PO LIQD: 40.0000 meq | Freq: Once | ORAL | Status: DC

## 2011-08-19 NOTE — ED Notes (Signed)
CCM showing NSR. Pt c/o burning at IV site. Rate turned down to 40 ml/hr. Pt resting with no complaints at present. Family at bedside.

## 2011-08-19 NOTE — ED Provider Notes (Signed)
History     CSN: [id number]  Arrival date & time 08/19/11  [id number]   First MD Initiated Contact with Patient 08/19/11 1009      Chief Complaint  Patient presents with  . Chest Pain    (Consider location/radiation/quality/duration/timing/severity/associated sxs/prior treatment) HPI Comments: Patient states she is treated for hypertension and high cholesterol. Patient states that in the last couple of days she's been having problems with her blood pressure being elevated. Today she noted a dull ache in her left chest. This no ache lasted approximately an hour. And was resolving but had not completely gone away upon her arrival to the emergency department. It is of note that the patient was seen by Dr. Arnoldo Morale 2 days ago and was noted to have an elevation of blood pressure 160/115. It is also of note that the patient injured her back approximately 30-40 days ago and at that time was told by an urgent care physician to stop her hydrochlorothiazide. The patient denies unusual sweats, loss of consciousness, neck jaw or arm pain, no shortness of breath. Patient does state she's had problems with headache from time to time. She presents at this time for evaluation of her blood pressure and also evaluation of the dull ache in the left chest.  The patient denies being on any birth control pills she has not been on any excessive trips, nor had any recent surgery. She has a history of a cardiac murmur from time to time. She's never been diagnosed with valvular problems or varicose veins. She is treated for hypertension. She is not a smoker and has never been a smoker. She does have a family history of hypertension and diabetes.  Patient is a 51 y.o. female presenting with chest pain. The history is provided by the patient and the spouse.  Chest Pain Pertinent negatives for primary symptoms include no shortness of breath, no cough, no wheezing, no palpitations, no abdominal pain and no dizziness.  Pertinent  negatives for past medical history include no seizures.     Past Medical History  Diagnosis Date  . High cholesterol   . Hypertension   . Hemorrhoids   . S/P colonoscopy Jan 2011    Dr. Benson Norway: sessile polyp (benign lymphoid), large hemorrhoids, repeat 5-10 years    Past Surgical History  Procedure Date  . Abdominal hysterectomy     Family History  Problem Relation Age of Onset  . Colon cancer Neg Hx     History  Substance Use Topics  . Smoking status: Never Smoker   . Smokeless tobacco: Not on file  . Alcohol Use: No    OB History    Grav Para Term Preterm Abortions TAB SAB Ect Mult Living                  Review of Systems  Constitutional: Negative for activity change.       All ROS Neg except as noted in HPI  HENT: Negative for nosebleeds and neck pain.   Eyes: Negative for photophobia and discharge.  Respiratory: Negative for cough, shortness of breath and wheezing.   Cardiovascular: Positive for chest pain. Negative for palpitations.  Gastrointestinal: Negative for abdominal pain and blood in stool.  Genitourinary: Negative for dysuria, frequency and hematuria.  Musculoskeletal: Negative for back pain and arthralgias.  Skin: Negative.   Neurological: Negative for dizziness, seizures and speech difficulty.  Psychiatric/Behavioral: Negative for hallucinations and confusion.    Allergies  Review of patient's allergies indicates no known  allergies.  Home Medications   Current Outpatient Rx  Name Route Sig Dispense Refill  . AMLODIPINE BESYLATE 10 MG PO TABS Oral Take 10 mg by mouth daily.     . ASPIRIN EC 81 MG PO TBEC Oral Take 81 mg by mouth daily.      . ATORVASTATIN CALCIUM 20 MG PO TABS Oral Take 20 mg by mouth at bedtime.     Marland Kitchen BIOTIN 5000 MCG PO TABS Oral Take 1 tablet by mouth daily as needed. Hair,nails    . GARLIC 123XX123 MG PO CAPS Oral Take 2 capsules by mouth daily.      Marland Kitchen HYDROCHLOROTHIAZIDE 25 MG PO TABS Oral Take 25 mg by mouth daily.      Marland Kitchen  LOSARTAN POTASSIUM 100 MG PO TABS Oral Take 100 mg by mouth daily.      Marland Kitchen POTASSIUM CHLORIDE CRYS ER 20 MEQ PO TBCR Oral Take 1 tablet (20 mEq total) by mouth daily. 15 tablet 0    BP 141/95  Pulse 73  Temp(Src) 98.3 F (36.8 C) (Oral)  Resp 16  Ht 5\' 2"  (1.575 m)  Wt 168 lb (76.204 kg)  BMI 30.73 kg/m2  SpO2 98%  Physical Exam  Nursing note and vitals reviewed. Constitutional: She is oriented to person, place, and time. She appears well-developed and well-nourished.  Non-toxic appearance.  HENT:  Head: Normocephalic.  Right Ear: Tympanic membrane and external ear normal.  Left Ear: Tympanic membrane and external ear normal.  Eyes: EOM and lids are normal. Pupils are equal, round, and reactive to light.  Neck: Normal range of motion. Neck supple. Carotid bruit is not present.  Cardiovascular: Normal rate, regular rhythm, intact distal pulses and normal pulses.   Murmur heard. Pulmonary/Chest: Breath sounds normal. No respiratory distress. She has no wheezes. She has no rales.       Symmetrical rise and fall of the chest.  Abdominal: Soft. Bowel sounds are normal. There is no tenderness. There is no guarding.  Musculoskeletal: Normal range of motion. She exhibits no edema and no tenderness.       Negative Homans sign.  Lymphadenopathy:       Head (right side): No submandibular adenopathy present.       Head (left side): No submandibular adenopathy present.    She has no cervical adenopathy.  Neurological: She is alert and oriented to person, place, and time. She has normal strength. No cranial nerve deficit or sensory deficit.  Skin: Skin is warm and dry.  Psychiatric: She has a normal mood and affect. Her speech is normal.    ED Course: Patient seen with me by Dr. Roxanne Mins.   Procedures (including critical care time) Pulse oximetry 98% on room air. Within normal limits by my interpretation. Labs Reviewed  CBC - Abnormal; Notable for the following:    RDW 16.1 (*)    All  other components within normal limits  BASIC METABOLIC PANEL - Abnormal; Notable for the following:    Potassium 2.9 (*)    Glucose, Bld 117 (*)    Creatinine, Ser 1.24 (*)    GFR calc non Af Amer 50 (*)    GFR calc Af Amer 58 (*)    All other components within normal limits  URINALYSIS, ROUTINE W REFLEX MICROSCOPIC - Abnormal; Notable for the following:    Color, Urine STRAW (*)    Hgb urine dipstick TRACE (*)    Protein, ur 30 (*)    All other components within normal  limits  URINE MICROSCOPIC-ADD ON - Abnormal; Notable for the following:    Squamous Epithelial / LPF FEW (*)    All other components within normal limits  TROPONIN I  POCT I-STAT TROPONIN I   Dg Chest 2 View  08/19/2011  *RADIOLOGY REPORT*  Clinical Data: Chest pain.  Hypertension.  CHEST - 2 VIEW  Comparison: 04/19/2010.  Findings: The heart remains normal in size.  The lungs are clear. Normal appearing bones.  IMPRESSION: Normal examination, unchanged.  Original Report Authenticated By: Gerald Stabs, M.D.  EKG: 9:44am Rate: 77, Rhythm: Normal sinus. P-R normal. QRS normal. ST Normal. No STEMI, or life threatening arrhythmia. It is   1. Hypokalemia   2. Hypertension       MDM  I have reviewed nursing notes, vital signs, and all appropriate lab and imaging results for this patient. Pressure monitored very closely as she has had several high readings. This is gradually improving after being in the emergency department. Plans were made for the patient to receive by mouth medication but this was not necessary. The potassium was found to be low at 2.9. This was improved with both oral and IV potassium. Patient and family made aware of all lab findings. Results discussed in detail. There's been no chest pain or discomfort after being in the emergency department. No nausea vomiting. Patient has been up to the bathroom without problem. Patient advised to resume her hydrochlorothiazide on a daily basis. She is given a  prescription for potassium supplement. An aunt strong we advised to use foods high in potassium. She is also strongly advised to see her primary physician for recheck of both her pressure, her potassium, and for recheck of the chest discomfort. She is to return to the emergency Department for any changes problems or concerns.       Lenox Ahr, [place] 08/19/11 [phone]

## 2011-08-19 NOTE — ED Notes (Signed)
Pt undressed and placed into a hospital gown. Pt was placed on heart monitor. EKG completed and given to MD. Family at bedside. NAD noted at this time.

## 2011-08-19 NOTE — ED Notes (Signed)
Pt c/o high blood pressure and a dull ache in her chest. States that it started a week ago. Pt was seen by Dr. Arnoldo Morale on Thursday and her blood pressure was 160/115. Then patient took it this am and it was 160/105. Denies shortness of breath or nausea.

## 2011-08-19 NOTE — ED Notes (Signed)
Family at bedside. PA Marshell Levan is in with the patient and family at this time.

## 2011-08-20 NOTE — ED Provider Notes (Signed)
Medical screening examination/treatment/procedure(s) were performed by non-physician practitioner and as supervising physician I was immediately available for consultation/collaboration.   Delora Fuel, MD [id number]

## 2011-08-23 NOTE — Progress Notes (Signed)
REVIEWED.  

## 2011-08-30 ENCOUNTER — Encounter: Payer: Self-pay | Admitting: Gastroenterology

## 2011-08-30 ENCOUNTER — Ambulatory Visit (INDEPENDENT_AMBULATORY_CARE_PROVIDER_SITE_OTHER): Admitting: Gastroenterology

## 2011-08-30 ENCOUNTER — Ambulatory Visit (HOSPITAL_COMMUNITY)
Admission: RE | Admit: 2011-08-30 | Discharge: 2011-08-30 | Disposition: A | Discharge status: Home / Self Care | Source: Ambulatory Visit | Attending: Gastroenterology | Admitting: Gastroenterology

## 2011-08-30 DIAGNOSIS — R109 Unspecified abdominal pain: Secondary | ICD-10-CM | POA: Insufficient documentation

## 2011-08-30 DIAGNOSIS — R1013 Epigastric pain: Secondary | ICD-10-CM | POA: Insufficient documentation

## 2011-08-30 LAB — CBC WITH DIFFERENTIAL/PLATELET
Basophils Absolute: 0.1 10*3/uL (ref 0.0–0.1)
Basophils Relative: 1 % (ref 0–1)
Eosinophils Absolute: 0.1 10*3/uL (ref 0.0–0.7)
Eosinophils Relative: 1 % (ref 0–5)
HCT: 41 % (ref 36.0–46.0)
Hemoglobin: 13.6 g/dL (ref 12.0–15.0)
Lymphocytes Relative: 36 % (ref 12–46)
Lymphs Abs: 3.1 10*3/uL (ref 0.7–4.0)
MCH: 28.3 pg (ref 26.0–34.0)
MCHC: 33.2 g/dL (ref 30.0–36.0)
MCV: 85.4 fL (ref 78.0–100.0)
Monocytes Absolute: 0.5 10*3/uL (ref 0.1–1.0)
Monocytes Relative: 6 % (ref 3–12)
Neutro Abs: 4.9 10*3/uL (ref 1.7–7.7)
Neutrophils Relative %: 57 % (ref 43–77)
Platelets: 342 10*3/uL (ref 150–400)
RBC: 4.8 MIL/uL (ref 3.87–5.11)
RDW: 15.6 % — ABNORMAL HIGH (ref 11.5–15.5)
WBC: 8.6 10*3/uL (ref 4.0–10.5)

## 2011-08-30 LAB — COMPREHENSIVE METABOLIC PANEL
ALT: 15 U/L (ref 0–35)
AST: 12 U/L (ref 0–37)
Albumin: 3.8 g/dL (ref 3.5–5.2)
Alkaline Phosphatase: 100 U/L (ref 39–117)
BUN: 26 mg/dL — ABNORMAL HIGH (ref 6–23)
CO2: 29 mEq/L (ref 19–32)
Calcium: 10.2 mg/dL (ref 8.4–10.5)
Chloride: 102 mEq/L (ref 96–112)
Creat: 1.58 mg/dL — ABNORMAL HIGH (ref 0.50–1.10)
Glucose, Bld: 110 mg/dL — ABNORMAL HIGH (ref 70–99)
Potassium: 3.6 mEq/L (ref 3.5–5.3)
Sodium: 141 mEq/L (ref 135–145)
Total Bilirubin: 0.3 mg/dL (ref 0.3–1.2)
Total Protein: 8.1 g/dL (ref 6.0–8.3)

## 2011-08-30 LAB — LIPASE: Lipase: 44 U/L (ref 11–59)

## 2011-08-30 MED ORDER — IOHEXOL 300 MG/ML  SOLN
80.0000 mL | Freq: Once | INTRAMUSCULAR | Status: AC | PRN
  Administered 2011-08-30: 80 mL via INTRAVENOUS

## 2011-08-30 MED ORDER — PANTOPRAZOLE SODIUM 40 MG PO TBEC: 40.0000 mg | DELAYED_RELEASE_TABLET | Freq: Every day | ORAL | Status: DC

## 2011-08-30 NOTE — Progress Notes (Signed)
Quick Note:  Reviewed with pt. See CT report. ______

## 2011-08-30 NOTE — Assessment & Plan Note (Signed)
51 year old female with remote hx of one self-limiting incident of epigastric pain in the setting of Celebrex. She has stopped Celebrex for several months and had been on Nexium. This was stopped in December due to absence of pain, reflux, dysphagia. She now reports with constant epigastric pain, lower abdominal pain since January. No N/V, no melena. No NSAIDs, aspirin powders. Denies fever or chills. Has good appetite but just does not want to eat. Started on Omeprazole 40 mg by PCP in early Jan. She states she is unsure if she was experiencing abdominal pain at this time or not. She has noted no improvement.   At this point, she is in obvious discomfort and holding her abdomen. However, she does not appear acutely ill or septic. Due to diffuseness of pain and physical exam with notable tenderness, will proceed with CT abd/pelvis stat today as well as CMP, CBC, and lipase. I anticipate an EGD in the near future after review of CT scan results. Need to rule out acute process first. Pt understands fully.

## 2011-08-30 NOTE — Progress Notes (Signed)
Referring Provider: Odette Fraction, MD Primary Care Physician:  Odette Fraction, MD, MD Primary Gastroenterologist: Dr. [name]   Chief Complaint  Patient presents with  . Abdominal Pain    HPI:   Megan [name] is a 51 year old female who returns today with history of hemorrhoids, last colonoscopy by Dr. Benson Norway in Jan 2011. She was last seen in Dec 2012 and referred to Dr. Arnoldo Morale for possible repair. She states he is holding off for now due to BP issues. She does note a remote hx of epigastric pain that occurred in the presence of Celebrex daily. She was started on Nexium and instructed to avoid Celebrex several visits prior. CMP and Lipase are on file from prior visits. As of last visit in December, she denied any abdominal pain. Denied any reflux, N/V.  She presents today with epigastric pain since early January. She states this pain is constant and sore to touch at times. States "hard to explain". Denies any N/V. She has cut out fried food. Denies any melena, NSAIDs, aspirin powders. No fever or chills. States appetite is good, she is hungry but just can't eat. States PCP started her back on Omeprazole 40 mg daily. She is unsure if she was experiencing pain prior to starting this or not, but she does not she was tender to palpation on the exam at the PCP's office.    Past Medical History  Diagnosis Date  . High cholesterol   . Hypertension   . Hemorrhoids   . S/P colonoscopy Jan 2011    Dr. Benson Norway: sessile polyp (benign lymphoid), large hemorrhoids, repeat 5-10 years    Past Surgical History  Procedure Date  . Abdominal hysterectomy     Current Outpatient Prescriptions  Medication Sig Dispense Refill  . amLODipine (NORVASC) 10 MG tablet Take 10 mg by mouth daily.       Marland Kitchen aspirin EC 81 MG tablet Take 81 mg by mouth daily.        Marland Kitchen atorvastatin (LIPITOR) 20 MG tablet Take 20 mg by mouth at bedtime.       . Biotin 5000 MCG TABS Take 1 tablet by mouth daily as needed. Hair,nails       . Garlic 123XX123 MG CAPS Take 2 capsules by mouth daily.        . hydrochlorothiazide (HYDRODIURIL) 25 MG tablet Take 25 mg by mouth daily.        Marland Kitchen losartan (COZAAR) 100 MG tablet Take 100 mg by mouth daily.        Marland Kitchen omeprazole (PRILOSEC) 40 MG capsule Take 40 mg by mouth daily.      Marland Kitchen oxyCODONE-acetaminophen (PERCOCET) 5-325 MG per tablet Take 1 tablet by mouth every 4 (four) hours as needed.      . triamterene-hydrochlorothiazide (MAXZIDE-25) 37.5-25 MG per tablet Take 1 tablet by mouth daily.      . potassium chloride SA (K-DUR,KLOR-CON) 20 MEQ tablet Take 1 tablet (20 mEq total) by mouth daily.  15 tablet  0    Allergies as of 08/30/2011  . (No Known Allergies)    Family History  Problem Relation Age of Onset  . Colon cancer Neg Hx     History   Social History  . Marital Status: Married    Spouse Name: N/A    Number of Children: N/A  . 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 Smoking status: Never Smoker   .  Smokeless tobacco: None  . Alcohol Use: No  . Drug Use: No  . Sexually Active: None   Other Topics Concern  . None   Social History Narrative  . None    Review of Systems: Gen: Denies fever, chills, anorexia. Denies fatigue, weakness, weight loss.  CV: Denies chest pain, palpitations, syncope, peripheral edema, and claudication. Resp: Denies dyspnea at rest, cough, wheezing, coughing up blood, and pleurisy. GI: Denies vomiting blood, jaundice, and fecal incontinence.   Denies dysphagia or odynophagia. Derm: Denies rash, itching, dry skin Psych: Denies depression, anxiety, memory loss, confusion. No homicidal or suicidal ideation.  Heme: Denies bruising, bleeding, and enlarged lymph nodes.  Physical Exam: BP 137/90  Pulse 85  Temp(Src) 97.4 F (36.3 C) (Temporal)  Ht 5\' 2"  (1.575 m)  Wt 166 lb 12.8 oz (75.66 kg)  BMI 30.51 kg/m2 General:   Alert and oriented. Pleasant but in obvious discomfort.  Does not appear acutely ill.  Head:  Normocephalic and atraumatic. Eyes:  Conjuctiva clear without scleral icterus. Mouth:  Oral mucosa pink and moist. Good dentition. No lesions. Heart:  S1, S2 present without murmurs, rubs, or gallops. Regular rate and rhythm. Abdomen:  +BS, soft, TTP specifically epigastric region but also lower abdominal diffusely as well. non-distended. No rebound or guarding. No HSM or masses noted. Msk:  Symmetrical without gross deformities. Normal posture. Extremities:  Without edema. Neurologic:  Alert and  oriented x4;  grossly normal neurologically. Skin:  Intact without significant lesions or rashes. Psych:  Alert and cooperative. Normal mood and affect.

## 2011-08-30 NOTE — Patient Instructions (Signed)
We have set you up for a CT scan today as well as blood work. We will be calling you as soon as the results are available.   Further recommendations once CT is completed.

## 2011-08-30 NOTE — Progress Notes (Unsigned)
Can we set pt up for an EGD as soon as possible? Thanks! CT negative for acute process. Pt aware this is next step.

## 2011-08-30 NOTE — Progress Notes (Signed)
Quick Note:  Discussed report with Dr. [name] and then also called Dr. Thornton Papas for further details. No signs of cecal volvulus. No appendicitis. Informed pt that in future if she ever was being worked up for appendicitis, she may present with symptoms differently than the classic symptoms.  Pt is still complaining of abdominal pain, but she is quite cheerful and in good spirits on the phone. Reassuring that we are not dealing with an acute abdomen. Upon further discussion, it is hard to tell if abdominal pain started before or after beginning Omeprazole. Unsure if she is experiencing a side effect, which would be rare, but we will switch PPIs to Protonix and proceed with EGD as soon as possible. Rx sent to pharmacy. ______

## 2011-08-31 ENCOUNTER — Other Ambulatory Visit: Payer: Self-pay | Admitting: Gastroenterology

## 2011-08-31 ENCOUNTER — Encounter: Payer: Self-pay | Admitting: Internal Medicine

## 2011-08-31 DIAGNOSIS — R109 Unspecified abdominal pain: Secondary | ICD-10-CM

## 2011-08-31 NOTE — Progress Notes (Signed)
Pt called back- She is scheduled for EGD on 01/22

## 2011-08-31 NOTE — Progress Notes (Signed)
LMOVM

## 2011-08-31 NOTE — Progress Notes (Signed)
Faxed to PCP

## 2011-09-01 NOTE — Progress Notes (Signed)
Thanks

## 2011-09-04 MED ORDER — SODIUM CHLORIDE 0.45 % IV SOLN
Freq: Once | INTRAVENOUS | Status: AC
  Administered 2011-09-05: 1000 mL via INTRAVENOUS

## 2011-09-05 ENCOUNTER — Encounter (HOSPITAL_COMMUNITY)
Admission: RE | Disposition: A | Discharge status: Home / Self Care | Payer: Self-pay | Source: Ambulatory Visit | Attending: Internal Medicine

## 2011-09-05 ENCOUNTER — Encounter (HOSPITAL_COMMUNITY): Payer: Self-pay | Admitting: *Deleted

## 2011-09-05 ENCOUNTER — Ambulatory Visit (HOSPITAL_COMMUNITY)
Admission: RE | Admit: 2011-09-05 | Discharge: 2011-09-05 | Disposition: A | Discharge status: Home / Self Care | Source: Ambulatory Visit | Attending: Internal Medicine | Admitting: Internal Medicine

## 2011-09-05 ENCOUNTER — Ambulatory Visit (HOSPITAL_COMMUNITY)

## 2011-09-05 DIAGNOSIS — E7800 Pure hypercholesterolemia, unspecified: Secondary | ICD-10-CM | POA: Insufficient documentation

## 2011-09-05 DIAGNOSIS — Z79899 Other long term (current) drug therapy: Secondary | ICD-10-CM | POA: Insufficient documentation

## 2011-09-05 DIAGNOSIS — R109 Unspecified abdominal pain: Secondary | ICD-10-CM

## 2011-09-05 DIAGNOSIS — I10 Essential (primary) hypertension: Secondary | ICD-10-CM | POA: Insufficient documentation

## 2011-09-05 DIAGNOSIS — K449 Diaphragmatic hernia without obstruction or gangrene: Secondary | ICD-10-CM | POA: Insufficient documentation

## 2011-09-05 DIAGNOSIS — Z7982 Long term (current) use of aspirin: Secondary | ICD-10-CM | POA: Insufficient documentation

## 2011-09-05 DIAGNOSIS — R1013 Epigastric pain: Secondary | ICD-10-CM | POA: Insufficient documentation

## 2011-09-05 DIAGNOSIS — R933 Abnormal findings on diagnostic imaging of other parts of digestive tract: Secondary | ICD-10-CM | POA: Insufficient documentation

## 2011-09-05 HISTORY — PX: ESOPHAGOGASTRODUODENOSCOPY: SHX5428

## 2011-09-05 SURGERY — EGD (ESOPHAGOGASTRODUODENOSCOPY)
Anesthesia: Moderate Sedation

## 2011-09-05 MED ORDER — MEPERIDINE HCL 100 MG/ML IJ SOLN
INTRAMUSCULAR | Status: AC
  Filled 2011-09-05: qty 2

## 2011-09-05 MED ORDER — MEPERIDINE HCL 100 MG/ML IJ SOLN
INTRAMUSCULAR | Status: DC | PRN
  Administered 2011-09-05: 25 mg via INTRAVENOUS
  Administered 2011-09-05: 50 mg via INTRAVENOUS

## 2011-09-05 MED ORDER — BUTAMBEN-TETRACAINE-BENZOCAINE 2-2-14 % EX AERO
INHALATION_SPRAY | CUTANEOUS | Status: DC | PRN
  Administered 2011-09-05: 2 via TOPICAL

## 2011-09-05 MED ORDER — MIDAZOLAM HCL 5 MG/5ML IJ SOLN
INTRAMUSCULAR | Status: DC | PRN
  Administered 2011-09-05 (×2): 2 mg via INTRAVENOUS

## 2011-09-05 MED ORDER — MIDAZOLAM HCL 5 MG/5ML IJ SOLN
INTRAMUSCULAR | Status: AC
  Filled 2011-09-05: qty 10

## 2011-09-05 NOTE — Op Note (Signed)
[facility] [address] [place], [place]  [address]  ENDOSCOPY PROCEDURE REPORT  PATIENT:  Megan, [name]  MR#:  [id number] BIRTHDATE:  [birth year], 50 yrs. old  GENDER:  female  ENDOSCOPIST:  R. Garfield Cornea, MD FACP [facility] Referred by:       Collene Gobble, M.D. Jenna Luo, M.D.  PROCEDURE DATE:  09/05/2011 PROCEDURE:  Diagnostic EGD  INDICATIONS:  epigastric pain refractory to PPI therapy currently. 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 CBC lipase and LFTs okay recently.  Pain not affected one way or the other with eating or having a bowel movement.  No improvement with protonix.  INFORMED CONSENT:   The risks, benefits, limitations, alternatives and imponderables have been discussed.  The potential for biopsy, esophogeal dilation, etc. have also been reviewed.  Questions have been answered.  All parties agreeable.  Please see the history and physical in the medical record for more information.  MEDICATIONS:   Versed 4 mg IV and Demerol 75 mg IV in divided doses. Cetacaine spray  DESCRIPTION OF PROCEDURE:   The [id number] endoscope was introduced through the mouth and advanced to the second portion of the duodenum without difficulty or limitations.  The mucosal surfaces were surveyed very carefully during advancement of the scope and upon withdrawal.  Retroflexion view of the proximal stomach and esophagogastric junction was performed.  <<PROCEDUREIMAGES>>  FINDINGS:  Normal esophagus. Small hiatal hernia; remainder of stomach appeared normal. Patent pylorus. Normal first and second portion of the duodenum  THERAPEUTIC / DIAGNOSTIC MANEUVERS PERFORMED:    none  COMPLICATIONS:   None  IMPRESSION:  Small hiatal hernia; remainder of exam normal. No explanation for patient's abdominal pain with today's examination.  RECOMMENDATIONS:       We'll proceed with a gallbladder ultrasound to evaluate the patient for noncalcified gallstones/sludge not seen             on recent CT. Further recommendations to follow.  ______________________________ R. Garfield Cornea, MD Quentin Ore  CC:  n. eSIGNED:   R. Garfield Cornea at 09/05/2011 02:27 PM  Phoebe Perch, [id number]

## 2011-09-05 NOTE — H&P (Signed)
  I have seen & examined the patient prior to the procedure(s) today and reviewed the history and physical/consultation.  There have been no changes.  After consideration of the risks, benefits, alternatives and imponderables, the patient has consented to the procedure(s).

## 2011-09-06 ENCOUNTER — Ambulatory Visit (HOSPITAL_COMMUNITY)
Admit: 2011-09-06 | Discharge: 2011-09-06 | Disposition: A | Discharge status: Home / Self Care | Source: Ambulatory Visit | Attending: Internal Medicine | Admitting: Internal Medicine

## 2011-09-06 DIAGNOSIS — R109 Unspecified abdominal pain: Secondary | ICD-10-CM | POA: Insufficient documentation

## 2011-09-11 ENCOUNTER — Other Ambulatory Visit: Payer: Self-pay | Admitting: Gastroenterology

## 2011-09-11 ENCOUNTER — Telehealth: Payer: Self-pay | Admitting: Internal Medicine

## 2011-09-11 DIAGNOSIS — R109 Unspecified abdominal pain: Secondary | ICD-10-CM

## 2011-09-11 NOTE — Telephone Encounter (Signed)
Pt called to see if her results are back from Wednesday. She can be reached at [phone]

## 2011-09-11 NOTE — Telephone Encounter (Signed)
Routed to RMR

## 2011-09-11 NOTE — Telephone Encounter (Signed)
Pt is scheduled for HIDA on 01/29 @ 10- LMOVM with instructions

## 2011-09-11 NOTE — Telephone Encounter (Signed)
U/s of gb negative; need a hida w cck; if that test negative, we need to look into malpositioned colon further

## 2011-09-11 NOTE — Telephone Encounter (Signed)
Pt aware of result and would like to do the hida scan Tuesday if possible. Can you please see if she can go on that day any time will be fine.

## 2011-09-11 NOTE — Telephone Encounter (Signed)
LMOM to call back

## 2011-09-12 ENCOUNTER — Encounter (HOSPITAL_COMMUNITY)

## 2011-09-12 ENCOUNTER — Encounter (HOSPITAL_COMMUNITY)
Admission: RE | Admit: 2011-09-12 | Discharge: 2011-09-12 | Disposition: A | Discharge status: Home / Self Care | Source: Ambulatory Visit | Attending: Internal Medicine | Admitting: Internal Medicine

## 2011-09-12 DIAGNOSIS — R109 Unspecified abdominal pain: Secondary | ICD-10-CM

## 2011-09-12 MED ORDER — TECHNETIUM TC 99M MEBROFENIN IV KIT
5.0000 | PACK | Freq: Once | INTRAVENOUS | Status: AC | PRN
  Administered 2011-09-12: 5 via INTRAVENOUS

## 2011-09-12 MED ORDER — SINCALIDE 5 MCG IJ SOLR
INTRAMUSCULAR | Status: AC
  Administered 2011-09-12: 1.51 ug via INTRAVENOUS
  Filled 2011-09-12: qty 5

## 2011-09-13 ENCOUNTER — Encounter (HOSPITAL_COMMUNITY): Payer: Self-pay | Admitting: Internal Medicine

## 2011-09-15 ENCOUNTER — Telehealth: Payer: Self-pay | Admitting: Internal Medicine

## 2011-09-15 NOTE — Telephone Encounter (Signed)
Patient is calling to get her [facility] results please advise??

## 2011-09-17 ENCOUNTER — Telehealth: Payer: Self-pay | Admitting: Internal Medicine

## 2011-09-17 NOTE — Telephone Encounter (Addendum)
Called pt and left message on answ svc [phone]).  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 Dennard Schaumann soon

## 2011-09-17 NOTE — Telephone Encounter (Signed)
Please see prior telephone note 

## 2011-09-18 ENCOUNTER — Encounter: Payer: Self-pay | Admitting: Gastroenterology

## 2011-09-18 ENCOUNTER — Telehealth: Payer: Self-pay | Admitting: Gastroenterology

## 2011-09-18 ENCOUNTER — Ambulatory Visit: Admitting: Gastroenterology

## 2011-09-18 NOTE — Telephone Encounter (Signed)
Records Cc to Dr. Arnoldo Morale

## 2011-09-18 NOTE — Telephone Encounter (Signed)
Pt scheduled to see Dr Arnoldo Morale on 02/12 @ 1:30- she is aware of the appt

## 2011-09-18 NOTE — Telephone Encounter (Signed)
RMR recommendations in other phone note.

## 2011-09-18 NOTE — Telephone Encounter (Signed)
Pt aware of test results and recommendation of RMR per previous phone note.  Crystal, please see previous phone note in pts chart, pt needs referrals to surgeon and nephrology.  Leighann, please cc pts pcp all test results.

## 2011-09-18 NOTE — Telephone Encounter (Signed)
Referral faxed to Dr Arnoldo Morale and to [facility]

## 2011-09-19 ENCOUNTER — Telehealth: Payer: Self-pay

## 2011-09-19 MED ORDER — HYDROCORTISONE ACETATE 25 MG RE SUPP: 25.0000 mg | Freq: Two times a day (BID) | RECTAL | Status: AC

## 2011-09-19 NOTE — Telephone Encounter (Signed)
Make sure bleeding has stopped. Any rectal discomfort? Avoid constipation, take miralax prn if needed. Has hx of hemorrhoids.  Keep appt with Arnoldo Morale

## 2011-09-19 NOTE — Telephone Encounter (Signed)
Pt called passing bright red blood from her rectum and would like to know what she need to do. She has an appointment with Dr. Arnoldo Morale on 2/12 Please advise her call back number is [phone]

## 2011-09-19 NOTE — Telephone Encounter (Signed)
Bleeding has stopped and Pt is itching at her rectum.  Her appointment with Arnoldo Morale on 2/12 is for her gallbladder not her hemorrhoids.

## 2011-09-19 NOTE — Telephone Encounter (Signed)
Avoid NSAIDs if at all possible.  Will send rx for anusol supp X 7 days.  Routine follow-up with [place] after seeing Arnoldo Morale, next several weeks. Contact if further bleeding.

## 2011-09-20 ENCOUNTER — Telehealth: Payer: Self-pay | Admitting: Internal Medicine

## 2011-09-20 NOTE — Telephone Encounter (Signed)
I discussed history of elevated creatinine and abnormal appearing kidneys on ultrasound along with HIDA results and CT findings of a malpositioned colon with  Dr. Dennard Schaumann on the telephone today.  He is agreement with nephrology referral and general surgery consultation.

## 2011-09-21 ENCOUNTER — Telehealth: Payer: Self-pay

## 2011-09-21 ENCOUNTER — Other Ambulatory Visit: Payer: Self-pay

## 2011-09-21 NOTE — Telephone Encounter (Signed)
Pt called this morning saying she had passed blood when she when to the bathroom and when she wipes. Please advise

## 2011-09-21 NOTE — Telephone Encounter (Signed)
Let's get a CBC. Let's make an appointment with [place] for next week.  To ED if large volumes of bright red blood, abdominal pain, weakness.

## 2011-09-27 NOTE — H&P (Signed)
NTS SOAP Note  Vital Signs:  Vitals as of: [id number]: Systolic [id number]: Diastolic 98: Heart Rate 83: Temp 96.2F: Height 15ft 2in: Weight 165Lbs 0 Ounces: OFC 0in: Respiratory Rate 0: O2 Saturation 0: Pain Level 3: BMI 30  BMI : 30.18 kg/m2  Subjective: This 51 Years 50 Months old Female presents for of ABDOMINAL ISSUES: ,Has been having intermittent midabdominal pain for the past few months.  No significant nausea, vomiting.  Not associated with food.  No fever, chills.  Has been seen by Dr. [name] of GI.  HIDA shows borderline gallbladder ejection fraction with reproducible symptoms of abdominal pain(mild).  Ct scan showed cecum in left upper quadrant, but no malrotation.  Mesenteric vessels normal.  Review of Symptoms:  Constitutional:unremarkable Head:unremarkable Eyes:unremarkable Nose/Mouth/Throat:unremarkable Cardiovascular:unremarkable Respiratory:unremarkable see above Genitourinary:unremarkable Musculoskeletal:unremarkable Skin:unremarkable Hematolgic/Lymphatic:unremarkable Allergic/Immunologic:unremarkable   Past Medical History:Reviewed   Past Medical History  Surgical History: unremarkable Medical Problems:  High Blood pressure, High cholesterol Allergies: nkda Medications: baby asa, atorvatatin, losartan, amlodipine, oxycodone   Social History:Reviewed   Social History  Preferred Language: English ([place]) Race:  Black or African American Ethnicity: Not Hispanic / Latino Age: 51 Years 50 Months Marital Status:  M Alcohol:  No Recreational drug(s):  No   Smoking Status: Never smoker reviewed on 08/17/2011  Family History:Reviewed   Family History  Is there a family history of:DM, HTN    Objective Information: General:Well appearing, well nourished in no distress. Head:Atraumatic; no masses; no abnormalities Neck:Supple without lymphadenopathy.  Heart:RRR, no murmur or gallop.  Normal  S1, S2.  No S3, S4.  Lungs:CTA bilaterally, no wheezes, rhonchi, rales.  Breathing unlabored. Abdomen:Soft, NT/ND, normal bowel sounds, no HSM, no masses.  No peritoneal signs.  Assessment:Chronic cholecystectomy, malposition of cecum  Diagnosis &amp; Procedure: DiagnosisCode: 575.11, ProcedureCode: [id number],    Plan:Will schedule for laparoscopic cholecystectomy/appendectomy.  This may or may not help her.  She understands.   Patient Education:Alternative treatments to surgery were discussed with patient (and family).Risks and benefits  of procedure were fully explained to the patient (and family) who gave informed consent. Patient/family questions were addressed.  Follow-up:Pending Surgery

## 2011-09-28 ENCOUNTER — Encounter (HOSPITAL_COMMUNITY)
Admission: RE | Admit: 2011-09-28 | Discharge: 2011-09-28 | Disposition: A | Discharge status: Home / Self Care | Source: Ambulatory Visit | Attending: General Surgery | Admitting: General Surgery

## 2011-09-28 ENCOUNTER — Encounter (HOSPITAL_COMMUNITY): Payer: Self-pay

## 2011-09-28 LAB — BASIC METABOLIC PANEL
BUN: 27 mg/dL — ABNORMAL HIGH (ref 6–23)
CO2: 30 mEq/L (ref 19–32)
Calcium: 10 mg/dL (ref 8.4–10.5)
Chloride: 102 mEq/L (ref 96–112)
Creatinine, Ser: 1.4 mg/dL — ABNORMAL HIGH (ref 0.50–1.10)
GFR calc Af Amer: 50 mL/min — ABNORMAL LOW (ref >90)
GFR calc non Af Amer: 43 mL/min — ABNORMAL LOW (ref >90)
Glucose, Bld: 119 mg/dL — ABNORMAL HIGH (ref 70–99)
Potassium: 3.4 mEq/L — ABNORMAL LOW (ref 3.5–5.1)
Sodium: 141 mEq/L (ref 135–145)

## 2011-09-28 LAB — DIFFERENTIAL
Basophils Absolute: 0 10*3/uL (ref 0.0–0.1)
Basophils Relative: 1 % (ref 0–1)
Eosinophils Absolute: 0.2 10*3/uL (ref 0.0–0.7)
Eosinophils Relative: 3 % (ref 0–5)
Lymphocytes Relative: 34 % (ref 12–46)
Lymphs Abs: 2.5 10*3/uL (ref 0.7–4.0)
Monocytes Absolute: 0.6 10*3/uL (ref 0.1–1.0)
Monocytes Relative: 8 % (ref 3–12)
Neutro Abs: 4.1 10*3/uL (ref 1.7–7.7)
Neutrophils Relative %: 55 % (ref 43–77)

## 2011-09-28 LAB — CBC
HCT: 40.4 % (ref 36.0–46.0)
Hemoglobin: 13.3 g/dL (ref 12.0–15.0)
MCH: 28.2 pg (ref 26.0–34.0)
MCHC: 32.9 g/dL (ref 30.0–36.0)
MCV: 85.8 fL (ref 78.0–100.0)
Platelets: 301 10*3/uL (ref 150–400)
RBC: 4.71 MIL/uL (ref 3.87–5.11)
RDW: 15.5 % (ref 11.5–15.5)
WBC: 7.5 10*3/uL (ref 4.0–10.5)

## 2011-09-28 LAB — SURGICAL PCR SCREEN
MRSA, PCR: NEGATIVE
Staphylococcus aureus: NEGATIVE

## 2011-09-28 NOTE — Telephone Encounter (Signed)
Noted  

## 2011-09-28 NOTE — Patient Instructions (Signed)
Megan [name]  09/28/2011   Your procedure is scheduled on:  09/29/11  Report to [facility] at 0730 AM.  Call this number if you have problems the morning of surgery: [phone]   Remember:   Do not eat food:After Midnight.  May have clear liquids:until Midnight .  Clear liquids include soda, tea, black coffee, apple or grape juice, broth.  Take these medicines the morning of surgery with A SIP OF WATER: protonix, amlodipine, losartan, triamterene/HCTZ, oxycodone   Do not wear jewelry, make-up or nail polish.  Do not wear lotions, powders, or perfumes. You may wear deodorant.  Do not shave 48 hours prior to surgery.  Do not bring valuables to the hospital.  Contacts, dentures or bridgework may not be worn into surgery.  Leave suitcase in the car. After surgery it may be brought to your room.  For patients admitted to the hospital, checkout time is 11:00 AM the day of discharge.   Patients discharged the day of surgery will not be allowed to drive home.  Name and phone number of your driver: family  Special Instructions: CHG Shower Use Special Wash: 1/2 bottle night before surgery and 1/2 bottle morning of surgery.   Please read over the following fact sheets that you were given: Pain Booklet, Coughing and Deep Breathing, MRSA Information, Surgical Site Infection Prevention, Anesthesia Post-op Instructions and Care and Recovery After Surgery   PATIENT INSTRUCTIONS POST-ANESTHESIA  IMMEDIATELY FOLLOWING SURGERY:  Do not drive or operate machinery for the first twenty four hours after surgery.  Do not make any important decisions for twenty four hours after surgery or while taking narcotic pain medications or sedatives.  If you develop intractable nausea and vomiting or a severe headache please notify your doctor immediately.  FOLLOW-UP:  Please make an appointment with your surgeon as instructed. You do not need to follow up with anesthesia unless specifically instructed to do  so.  WOUND CARE INSTRUCTIONS (if applicable):  Keep a dry clean dressing on the anesthesia/puncture wound site if there is drainage.  Once the wound has quit draining you may leave it open to air.  Generally you should leave the bandage intact for twenty four hours unless there is drainage.  If the epidural site drains for more than 36-48 hours please call the anesthesia department.  QUESTIONS?:  Please feel free to call your physician or the hospital operator if you have any questions, and they will be happy to assist you.     [place] [phone]  Laparoscopic Cholecystectomy Laparoscopic cholecystectomy is surgery to remove the gallbladder. The gallbladder is located slightly to the right of center in the abdomen, behind the liver. It is a concentrating and storage sac for the bile produced in the liver. Bile aids in the digestion and absorption of fats. Gallbladder disease (cholecystitis) is an inflammation of your gallbladder. This condition is usually caused by a buildup of gallstones (cholelithiasis) in your gallbladder. Gallstones can block the flow of bile, resulting in inflammation and pain. In severe cases, emergency surgery may be required. When emergency surgery is not required, you will have time to prepare for the procedure. Laparoscopic surgery is an alternative to open surgery. Laparoscopic surgery usually has a shorter recovery time. Your common bile duct may also need to be examined and explored. Your caregiver will discuss this with you if he or she feels this should be done. If stones are found in the common bile  duct, they may be removed. LET YOUR CAREGIVER KNOW ABOUT:  Allergies to food or medicine.   Medicines taken, including vitamins, herbs, eyedrops, over-the-counter medicines, and creams.   Use of steroids (by mouth or creams).   Previous problems with anesthetics or numbing medicines.   History of  bleeding problems or blood clots.   Previous surgery.   Other health problems, including diabetes and kidney problems.   Possibility of pregnancy, if this applies.  RISKS AND COMPLICATIONS All surgery is associated with risks. Some problems that may occur following this procedure include:  Infection.   Damage to the common bile duct, nerves, arteries, veins, or other internal organs such as the stomach or intestines.   Bleeding.   A stone may remain in the common bile duct.  BEFORE THE PROCEDURE  Do not take aspirin for 3 days prior to surgery or blood thinners for 1 week prior to surgery.   Do not eat or drink anything after midnight the night before surgery.   Let your caregiver know if you develop a cold or other infectious problem prior to surgery.   You should be present 60 minutes before the procedure or as directed.  PROCEDURE  You will be given medicine that makes you sleep (general anesthetic). When you are asleep, your surgeon will make several small cuts (incisions) in your abdomen. One of these incisions is used to insert a small, lighted scope (laparoscope) into the abdomen. The laparoscope helps the surgeon see into your abdomen. Carbon dioxide gas will be pumped into your abdomen. The gas allows more room for the surgeon to perform your surgery. Other operating instruments are inserted through the other incisions. Laparoscopic procedures may not be appropriate when:  There is major scarring from previous surgery.   The gallbladder is extremely inflamed.   There are bleeding disorders or unexpected cirrhosis of the liver.   A pregnancy is near term.   Other conditions make the laparoscopic procedure impossible.  If your surgeon feels it is not safe to continue with a laparoscopic procedure, he or she will perform an open abdominal procedure. In this case, the surgeon will make an incision to open the abdomen. This gives the surgeon a larger view and field to work  within. This may allow the surgeon to perform procedures that sometimes cannot be performed with a laparoscope alone. Open surgery has a longer recovery time. AFTER THE PROCEDURE  You will be taken to the recovery area where a nurse will watch and check your progress.   You may be allowed to go home the same day.   Do not resume physical activities until directed by your caregiver.   You may resume a normal diet and activities as directed.  Document Released: 07/31/2005 Document Revised: 04/12/2011 Document Reviewed: 01/13/2011 [facility] Patient Information 2012 [employer].

## 2011-09-28 NOTE — Telephone Encounter (Signed)
Pt is doing better and is going to have surgery in the morning for her gallbladder and her appendix. She had blood work done.

## 2011-09-29 ENCOUNTER — Encounter (HOSPITAL_COMMUNITY): Payer: Self-pay | Admitting: Anesthesiology

## 2011-09-29 ENCOUNTER — Ambulatory Visit (HOSPITAL_COMMUNITY): Admitting: Anesthesiology

## 2011-09-29 ENCOUNTER — Encounter (HOSPITAL_COMMUNITY)
Admission: RE | Disposition: A | Discharge status: Home / Self Care | Payer: Self-pay | Source: Ambulatory Visit | Attending: General Surgery

## 2011-09-29 ENCOUNTER — Other Ambulatory Visit: Payer: Self-pay | Admitting: General Surgery

## 2011-09-29 ENCOUNTER — Ambulatory Visit (HOSPITAL_COMMUNITY)
Admission: RE | Admit: 2011-09-29 | Discharge: 2011-09-29 | Disposition: A | Discharge status: Home / Self Care | Source: Ambulatory Visit | Attending: General Surgery | Admitting: General Surgery

## 2011-09-29 DIAGNOSIS — I10 Essential (primary) hypertension: Secondary | ICD-10-CM | POA: Insufficient documentation

## 2011-09-29 DIAGNOSIS — Z01812 Encounter for preprocedural laboratory examination: Secondary | ICD-10-CM | POA: Insufficient documentation

## 2011-09-29 DIAGNOSIS — K811 Chronic cholecystitis: Secondary | ICD-10-CM | POA: Insufficient documentation

## 2011-09-29 DIAGNOSIS — Z79899 Other long term (current) drug therapy: Secondary | ICD-10-CM | POA: Insufficient documentation

## 2011-09-29 DIAGNOSIS — E7800 Pure hypercholesterolemia, unspecified: Secondary | ICD-10-CM | POA: Insufficient documentation

## 2011-09-29 DIAGNOSIS — Z7982 Long term (current) use of aspirin: Secondary | ICD-10-CM | POA: Insufficient documentation

## 2011-09-29 HISTORY — PX: CHOLECYSTECTOMY: SHX55

## 2011-09-29 HISTORY — PX: LAPAROSCOPIC APPENDECTOMY: SHX408

## 2011-09-29 SURGERY — LAPAROSCOPIC CHOLECYSTECTOMY
Anesthesia: General | Site: Abdomen | Wound class: Clean Contaminated

## 2011-09-29 MED ORDER — FENTANYL CITRATE 0.05 MG/ML IJ SOLN
INTRAMUSCULAR | Status: AC
  Filled 2011-09-29: qty 5

## 2011-09-29 MED ORDER — OXYCODONE-ACETAMINOPHEN 5-325 MG PO TABS: 1.0000 | ORAL_TABLET | ORAL | Status: DC | PRN

## 2011-09-29 MED ORDER — GLYCOPYRROLATE 0.2 MG/ML IJ SOLN
INTRAMUSCULAR | Status: DC | PRN
  Administered 2011-09-29: .4 mg via INTRAVENOUS

## 2011-09-29 MED ORDER — FENTANYL CITRATE 0.05 MG/ML IJ SOLN
INTRAMUSCULAR | Status: AC
  Administered 2011-09-29: 50 ug via INTRAVENOUS
  Filled 2011-09-29: qty 2

## 2011-09-29 MED ORDER — NALOXONE HCL 0.4 MG/ML IJ SOLN
INTRAMUSCULAR | Status: AC
  Filled 2011-09-29: qty 1

## 2011-09-29 MED ORDER — FENTANYL CITRATE 0.05 MG/ML IJ SOLN
INTRAMUSCULAR | Status: DC | PRN
  Administered 2011-09-29 (×3): 100 ug via INTRAVENOUS
  Administered 2011-09-29 (×2): 150 ug via INTRAVENOUS

## 2011-09-29 MED ORDER — FENTANYL CITRATE 0.05 MG/ML IJ SOLN
INTRAMUSCULAR | Status: AC
  Filled 2011-09-29: qty 2

## 2011-09-29 MED ORDER — ROCURONIUM BROMIDE 100 MG/10ML IV SOLN
INTRAVENOUS | Status: DC | PRN
  Administered 2011-09-29: 40 mg via INTRAVENOUS

## 2011-09-29 MED ORDER — SODIUM CHLORIDE 0.9 % IR SOLN
Status: DC | PRN
  Administered 2011-09-29: 1000 mL

## 2011-09-29 MED ORDER — LACTATED RINGERS IV SOLN
INTRAVENOUS | Status: DC
  Administered 2011-09-29: 10:00:00 via INTRAVENOUS

## 2011-09-29 MED ORDER — PROPOFOL 10 MG/ML IV BOLUS
INTRAVENOUS | Status: DC | PRN
  Administered 2011-09-29: 150 mg via INTRAVENOUS

## 2011-09-29 MED ORDER — ENOXAPARIN SODIUM 40 MG/0.4ML ~~LOC~~ SOLN
40.0000 mg | Freq: Once | SUBCUTANEOUS | Status: AC
  Administered 2011-09-29: 40 mg via SUBCUTANEOUS

## 2011-09-29 MED ORDER — PHENYLEPHRINE HCL 10 MG/ML IJ SOLN
INTRAMUSCULAR | Status: AC
  Filled 2011-09-29: qty 1

## 2011-09-29 MED ORDER — LACTATED RINGERS IV SOLN
INTRAVENOUS | Status: DC | PRN
  Administered 2011-09-29 (×2): via INTRAVENOUS

## 2011-09-29 MED ORDER — KETOROLAC TROMETHAMINE 30 MG/ML IJ SOLN
30.0000 mg | Freq: Once | INTRAMUSCULAR | Status: AC
  Administered 2011-09-29: 30 mg via INTRAVENOUS

## 2011-09-29 MED ORDER — METOPROLOL TARTRATE 1 MG/ML IV SOLN
INTRAVENOUS | Status: AC
  Filled 2011-09-29: qty 5

## 2011-09-29 MED ORDER — BUPIVACAINE HCL (PF) 0.5 % IJ SOLN
INTRAMUSCULAR | Status: AC
  Filled 2011-09-29: qty 30

## 2011-09-29 MED ORDER — DEXTROSE 5 % IV SOLN
INTRAVENOUS | Status: AC
  Filled 2011-09-29: qty 2

## 2011-09-29 MED ORDER — HEMOSTATIC AGENTS (NO CHARGE) OPTIME
TOPICAL | Status: DC | PRN
  Administered 2011-09-29: 1 via TOPICAL

## 2011-09-29 MED ORDER — MIDAZOLAM HCL 2 MG/2ML IJ SOLN
INTRAMUSCULAR | Status: AC
  Filled 2011-09-29: qty 2

## 2011-09-29 MED ORDER — GLYCOPYRROLATE 0.2 MG/ML IJ SOLN
INTRAMUSCULAR | Status: AC
  Filled 2011-09-29: qty 1

## 2011-09-29 MED ORDER — ONDANSETRON HCL 4 MG/2ML IJ SOLN
INTRAMUSCULAR | Status: AC
  Filled 2011-09-29: qty 2

## 2011-09-29 MED ORDER — LIDOCAINE HCL 1 % IJ SOLN
INTRAMUSCULAR | Status: DC | PRN
  Administered 2011-09-29: 25 mg via INTRADERMAL

## 2011-09-29 MED ORDER — DEXTROSE 5 % IV SOLN: 2.0000 g | INTRAVENOUS | Status: DC

## 2011-09-29 MED ORDER — BUPIVACAINE HCL 0.5 % IJ SOLN
INTRAMUSCULAR | Status: DC | PRN
  Administered 2011-09-29: 10 mL

## 2011-09-29 MED ORDER — DEXTROSE 5 % IV SOLN
1.0000 g | INTRAVENOUS | Status: DC | PRN
  Administered 2011-09-29: 2 g via INTRAVENOUS

## 2011-09-29 MED ORDER — ACETAMINOPHEN 325 MG PO TABS: 325.0000 mg | ORAL_TABLET | ORAL | Status: DC | PRN

## 2011-09-29 MED ORDER — MIDAZOLAM HCL 2 MG/2ML IJ SOLN
1.0000 mg | INTRAMUSCULAR | Status: DC | PRN
  Administered 2011-09-29: 2 mg via INTRAVENOUS

## 2011-09-29 MED ORDER — GLYCOPYRROLATE 0.2 MG/ML IJ SOLN
0.2000 mg | Freq: Once | INTRAMUSCULAR | Status: AC
  Administered 2011-09-29: 0.2 mg via INTRAVENOUS

## 2011-09-29 MED ORDER — ENOXAPARIN SODIUM 40 MG/0.4ML ~~LOC~~ SOLN
SUBCUTANEOUS | Status: AC
  Administered 2011-09-29: 40 mg via SUBCUTANEOUS
  Filled 2011-09-29: qty 0.4

## 2011-09-29 MED ORDER — FENTANYL CITRATE 0.05 MG/ML IJ SOLN
25.0000 ug | INTRAMUSCULAR | Status: DC | PRN
  Administered 2011-09-29 (×4): 50 ug via INTRAVENOUS

## 2011-09-29 MED ORDER — ONDANSETRON HCL 4 MG/2ML IJ SOLN
4.0000 mg | Freq: Once | INTRAMUSCULAR | Status: AC
  Administered 2011-09-29: 4 mg via INTRAVENOUS

## 2011-09-29 MED ORDER — NEOSTIGMINE METHYLSULFATE 1 MG/ML IJ SOLN
INTRAMUSCULAR | Status: DC | PRN
  Administered 2011-09-29: 3 mg via INTRAVENOUS

## 2011-09-29 MED ORDER — METOPROLOL TARTRATE 1 MG/ML IV SOLN
INTRAVENOUS | Status: DC | PRN
  Administered 2011-09-29: 2 mg via INTRAVENOUS

## 2011-09-29 MED ORDER — MIDAZOLAM HCL 5 MG/5ML IJ SOLN
INTRAMUSCULAR | Status: DC | PRN
  Administered 2011-09-29: 2 mg via INTRAVENOUS

## 2011-09-29 MED ORDER — KETOROLAC TROMETHAMINE 30 MG/ML IJ SOLN
INTRAMUSCULAR | Status: AC
  Filled 2011-09-29: qty 1

## 2011-09-29 MED ORDER — ONDANSETRON HCL 4 MG/2ML IJ SOLN: 4.0000 mg | Freq: Once | INTRAMUSCULAR | Status: DC | PRN

## 2011-09-29 SURGICAL SUPPLY — 32 items
APPLIER CLIP ROT 10 11.4 M/L (STAPLE) ×3
APR CLP MED LRG 11.4X10 (STAPLE) ×2
BAG HAMPER (MISCELLANEOUS) ×3 IMPLANT
BAG SPEC RTRVL LRG 6X4 10 (ENDOMECHANICALS) ×4
CLIP APPLIE ROT 10 11.4 M/L (STAPLE) ×2 IMPLANT
CLOTH BEACON ORANGE TIMEOUT ST (SAFETY) ×3 IMPLANT
COVER LIGHT HANDLE STERIS (MISCELLANEOUS) ×6 IMPLANT
CUTTER LINEAR ENDO 35 ETS TH (STAPLE) ×1 IMPLANT
DECANTER SPIKE VIAL GLASS SM (MISCELLANEOUS) ×3 IMPLANT
DURAPREP 26ML APPLICATOR (WOUND CARE) ×3 IMPLANT
ELECT REM PT RETURN 9FT ADLT (ELECTROSURGICAL) ×3
ELECTRODE REM PT RTRN 9FT ADLT (ELECTROSURGICAL) ×2 IMPLANT
FILTER SMOKE EVAC LAPAROSHD (FILTER) ×3 IMPLANT
FORMALIN 10 PREFIL 120ML (MISCELLANEOUS) ×4 IMPLANT
GLOVE BIO SURGEON STRL SZ7.5 (GLOVE) ×3 IMPLANT
GOWN STRL REIN XL XLG (GOWN DISPOSABLE) ×9 IMPLANT
HEMOSTAT SNOW SURGICEL 2X4 (HEMOSTASIS) ×3 IMPLANT
INST SET LAPROSCOPIC AP (KITS) ×3 IMPLANT
KIT ROOM TURNOVER APOR (KITS) ×3 IMPLANT
KIT TROCAR LAP CHOLE (TROCAR) ×3 IMPLANT
MANIFOLD NEPTUNE II (INSTRUMENTS) ×3 IMPLANT
NS IRRIG 1000ML POUR BTL (IV SOLUTION) ×3 IMPLANT
PACK LAP CHOLE LZT030E (CUSTOM PROCEDURE TRAY) ×3 IMPLANT
PAD ARMBOARD 7.5X6 YLW CONV (MISCELLANEOUS) ×3 IMPLANT
POUCH SPECIMEN RETRIEVAL 10MM (ENDOMECHANICALS) ×4 IMPLANT
SET BASIN LINEN APH (SET/KITS/TRAYS/PACK) ×3 IMPLANT
SPONGE GAUZE 2X2 8PLY STRL LF (GAUZE/BANDAGES/DRESSINGS) ×12 IMPLANT
STAPLER VISISTAT (STAPLE) ×3 IMPLANT
SUT VICRYL 0 UR6 27IN ABS (SUTURE) ×3 IMPLANT
TROCAR Z-THAD FIOS HNDL 12X100 (TROCAR) ×1 IMPLANT
WARMER LAPAROSCOPE (MISCELLANEOUS) ×3 IMPLANT
YANKAUER SUCT 12FT TUBE ARGYLE (SUCTIONS) ×3 IMPLANT

## 2011-09-29 NOTE — Interval H&P Note (Signed)
History and Physical Interval Note:  09/29/2011 9:24 AM  Megan [name]  has presented today for surgery, with the diagnosis of Calculus of gallbladder with other cholecystitis, without mention of obstruction   The various methods of treatment have been discussed with the patient and family. After consideration of risks, benefits and other options for treatment, the patient has consented to  Procedure(s) (LRB): LAPAROSCOPIC CHOLECYSTECTOMY (N/A) as a surgical intervention .  The patients' history has been reviewed, patient examined, no change in status, stable for surgery.  I have reviewed the patients' chart and labs.  Questions were answered to the patient's satisfaction.     [place] A  Patient will also have incidental appendectomy.

## 2011-09-29 NOTE — Progress Notes (Signed)
Previous 5 puncture sites not documented in pacu record. UNable to pull up category in Epic to assess wounds. % puncture sites dry and intact- 2x2s over sites WNL

## 2011-09-29 NOTE — Transfer of Care (Signed)
Immediate Anesthesia Transfer of Care Note  Patient: Megan [name]  Procedure(s) Performed: Procedure(s) (LRB): LAPAROSCOPIC CHOLECYSTECTOMY (N/A) APPENDECTOMY LAPAROSCOPIC ()  Patient Location: PACU  Anesthesia Type: General  Level of Consciousness: awake and patient cooperative  Airway & Oxygen Therapy: Patient Spontanous Breathing and Patient connected to face mask oxygen  Post-op Assessment: Report given to PACU RN, Post -op Vital signs reviewed and stable and Patient moving all extremities  Post vital signs: Reviewed and stable  Complications: No apparent anesthesia complications

## 2011-09-29 NOTE — Interval H&P Note (Signed)
History and Physical Interval Note:  09/29/2011 8:49 AM  Megan [name]  has presented today for surgery, with the diagnosis of Calculus of gallbladder with other cholecystitis, without mention of obstruction   The various methods of treatment have been discussed with the patient and family. After consideration of risks, benefits and other options for treatment, the patient has consented to  Procedure(s) (LRB): LAPAROSCOPIC CHOLECYSTECTOMY (N/A) as a surgical intervention .  The patients' history has been reviewed, patient examined, no change in status, stable for surgery.  I have reviewed the patients' chart and labs.  Questions were answered to the patient's satisfaction.     Aviva Signs A

## 2011-09-29 NOTE — Anesthesia Postprocedure Evaluation (Signed)
  Anesthesia Post-op Note  Patient: Megan [name]  Procedure(s) Performed: Procedure(s) (LRB): LAPAROSCOPIC CHOLECYSTECTOMY (N/A) APPENDECTOMY LAPAROSCOPIC ()  Patient Location: PACU  Anesthesia Type: General  Level of Consciousness: awake, alert , oriented and patient cooperative  Airway and Oxygen Therapy: Patient Spontanous Breathing  Post-op Pain: none  Post-op Assessment: Post-op Vital signs reviewed, Patient's Cardiovascular Status Stable, Respiratory Function Stable, Patent Airway and No signs of Nausea or vomiting  Post-op Vital Signs: Reviewed and stable  Complications: No apparent anesthesia complications

## 2011-09-29 NOTE — Anesthesia Procedure Notes (Signed)
Procedure Name: Intubation Date/Time: 09/29/2011 9:49 AM Performed by: Charmaine Downs Pre-anesthesia Checklist: Emergency Drugs available, Suction available, Patient identified and Patient being monitored Patient Re-evaluated:Patient Re-evaluated prior to inductionOxygen Delivery Method: Circle System Utilized Preoxygenation: Pre-oxygenation with 100% oxygen Intubation Type: IV induction Ventilation: Mask ventilation without difficulty Laryngoscope Size: 3 and Mac Grade View: Grade I Tube type: Oral Tube size: 7.0 mm Number of attempts: 1 Airway Equipment and Method: stylet Placement Confirmation: ETT inserted through vocal cords under direct vision,  positive ETCO2 and breath sounds checked- equal and bilateral Secured at: 22 cm Tube secured with: Tape Dental Injury: Teeth and Oropharynx as per pre-operative assessment

## 2011-09-29 NOTE — Op Note (Signed)
Patient:  Megan [name]  DOB:  [birth year]  MRN:  [id number]   Preop Diagnosis:  Chronic cholecystitis  Postop Diagnosis:  Same  Procedure:  Laparoscopic cholecystectomy, incidental appendectomy  Surgeon:  Aviva Signs, M.D.  Anes:  General endotracheal  Indications:  Patient is a 51 year old black female presents with nonspecific abdominal pain. She is on a CAT scan the abdomen to have a cecum which was located in the left upper quadrant. She does not have malrotation of the intestines. In addition, height his skin reveals chronic cholecystitis with a borderline ejection fraction and reproducible symptoms with CCK injection. The patient comes the operative for laparoscopic cholecystectomy with incidental appendectomy do to the malposition of the cecum. The risks and benefits of the procedures including bleeding, infection, hepatobiliary injury, possibly of an open procedure were fully explained to the patient, gave informed consent.  Procedure note:  Patient is placed the supine position. After induction of general endotracheal anesthesia, the abdomen was prepped and draped using usual sterile technique with DuraPrep. Surgical site confirmation was performed.  An infraumbilical incision was made down to the fascia. A Veress needle was introduced into the abdominal cavity and confirmation of placement was done using the saline drop test. The abdomen was then insufflated to 16 mm mercury pressure. An 11 mm trocar was introduced into the abdominal cavity under direct visualization without difficulty. Patient was placed in reverse Trendelenburg position additional 11 mm trocar was placed in the epigastric region and 5 mm trochars were placed in the right upper quadrant and right flank regions. The liver was inspected and noted to be within normal limits. The gallbladder was retracted superior and laterally. Dissection was begun around the infundibulum the gallbladder. The cystic duct was first  identified. Its juncture to the infundibulum was fully identified. Endoclips were placed proximally in distally on the cystic duct and the cystic duct was divided. This was likewise done to the cystic artery. The gallbladder was then freed away from the gallbladder fossa using Bovie electrocautery. The gallbladder was delivered through the epigastric trocar site using an Endo Catch bag. The gallbladder fossa was inspected and no abnormal bleeding or bile leakage was noted. Surgicel is placed the gallbladder fossa.  Next, our attention turned to the appendix. The cecum was found in the midportion of the abdomen. A 12 mm trocar was placed in the left lower quadrant region to help facilitate the dissection. The had no attachments to the right paracolic gutter. There were no Ladd's bands seen. No gross abnormalities of the cecum was noted. The mesoappendix was divided using the harmonic scalpel. A vascular Endo GIA was placed across the base the appendix and fired. The appendix was removed using an Endo Catch bag and sent to pathology further examination. The staple line was inspected and noted within normal limits. All air was then evacuated from the abdomen prior to removal trochars.  All wounds were gave normal saline. All wounds were checked with 0.5% Sensorcaine. The infraumbilical fascia, epigastric fascia, and left lower quadrant fashion as were reapproximated using 0 Vicryl interrupted sutures. All skin incisions were closed using staples. Betadine ointment dorsal dressings were applied.  All tape and needle counts were correct at the end the procedure. Patient was extubated in the operating room and went back to recovery awake in stable condition.  Complications:  None  EBL:  Minimal  Specimen:  Gallbladder, appendix

## 2011-09-29 NOTE — Progress Notes (Signed)
Temp 97.3 orally. bair hugger d/c'd. Warm blankets continued. Tolerated well.

## 2011-09-29 NOTE — Progress Notes (Signed)
From OR. Difficulty obtaining temp. Orally and axillary. Final reading 94.3 axillary. Warm blankets and bair hugger applied. Will continue to monitor temp.

## 2011-09-29 NOTE — Progress Notes (Signed)
drsg to abd x5 dry/intact.

## 2011-09-29 NOTE — Anesthesia Preprocedure Evaluation (Signed)
Anesthesia Evaluation  Patient identified by MRN, date of birth, ID band Patient awake    Reviewed: Allergy & Precautions, H&P , NPO status , Patient's Chart, lab work & pertinent test results  Airway Mallampati: I TM Distance: >3 FB Neck ROM: Full    Dental No notable dental hx.    Pulmonary shortness of breath,    Pulmonary exam normal       Cardiovascular hypertension, Pt. on medications Regular Normal    Neuro/Psych Negative Neurological ROS  Negative Psych ROS   GI/Hepatic negative GI ROS, Neg liver ROS, GERD-  Medicated and Controlled,  Endo/Other  Negative Endocrine ROS  Renal/GU negative Renal ROS     Musculoskeletal negative musculoskeletal ROS (+)   Abdominal Normal abdominal exam  (+)   Peds  Hematology negative hematology ROS (+)   Anesthesia Other Findings   Reproductive/Obstetrics negative OB ROS                           Anesthesia Physical Anesthesia Plan  ASA: II  Anesthesia Plan: General   Post-op Pain Management:    Induction: Intravenous, Rapid sequence and Cricoid pressure planned  Airway Management Planned: Oral ETT  Additional Equipment:   Intra-op Plan:   Post-operative Plan: Extubation in OR  Informed Consent: I have reviewed the patients History and Physical, chart, labs and discussed the procedure including the risks, benefits and alternatives for the proposed anesthesia with the patient or authorized representative who has indicated his/her understanding and acceptance.   Dental advisory given  Plan Discussed with: CRNA  Anesthesia Plan Comments:         Anesthesia Quick Evaluation

## 2011-10-04 ENCOUNTER — Encounter (HOSPITAL_COMMUNITY): Payer: Self-pay | Admitting: General Surgery

## 2011-11-06 ENCOUNTER — Encounter: Payer: Self-pay | Admitting: Internal Medicine

## 2011-11-27 ENCOUNTER — Telehealth: Payer: Self-pay

## 2011-11-27 NOTE — Telephone Encounter (Signed)
Great!!! Glad to hear.  

## 2011-11-27 NOTE — Telephone Encounter (Signed)
Pt received a letter in the mail to make a follow up appointment with [place]. She was in the office in January and she recently had gallbladder surgery and she is not having any problems at this time. She will call us if she need anything.

## 2012-02-03 ENCOUNTER — Encounter (HOSPITAL_COMMUNITY): Payer: Self-pay

## 2012-02-03 ENCOUNTER — Emergency Department (HOSPITAL_COMMUNITY)
Admission: EM | Admit: 2012-02-03 | Discharge: 2012-02-03 | Disposition: A | Discharge status: Home / Self Care | Attending: Emergency Medicine | Admitting: Emergency Medicine

## 2012-02-03 DIAGNOSIS — E7800 Pure hypercholesterolemia, unspecified: Secondary | ICD-10-CM | POA: Insufficient documentation

## 2012-02-03 DIAGNOSIS — T148 Other injury of unspecified body region: Secondary | ICD-10-CM | POA: Insufficient documentation

## 2012-02-03 DIAGNOSIS — K649 Unspecified hemorrhoids: Secondary | ICD-10-CM | POA: Insufficient documentation

## 2012-02-03 DIAGNOSIS — IMO0001 Reserved for inherently not codable concepts without codable children: Secondary | ICD-10-CM | POA: Insufficient documentation

## 2012-02-03 DIAGNOSIS — Z7982 Long term (current) use of aspirin: Secondary | ICD-10-CM | POA: Insufficient documentation

## 2012-02-03 DIAGNOSIS — M7918 Myalgia, other site: Secondary | ICD-10-CM

## 2012-02-03 DIAGNOSIS — Z8249 Family history of ischemic heart disease and other diseases of the circulatory system: Secondary | ICD-10-CM | POA: Insufficient documentation

## 2012-02-03 DIAGNOSIS — Z800 Family history of malignant neoplasm of digestive organs: Secondary | ICD-10-CM | POA: Insufficient documentation

## 2012-02-03 DIAGNOSIS — Z90710 Acquired absence of both cervix and uterus: Secondary | ICD-10-CM | POA: Insufficient documentation

## 2012-02-03 DIAGNOSIS — W57XXXA Bitten or stung by nonvenomous insect and other nonvenomous arthropods, initial encounter: Secondary | ICD-10-CM | POA: Insufficient documentation

## 2012-02-03 DIAGNOSIS — Z833 Family history of diabetes mellitus: Secondary | ICD-10-CM | POA: Insufficient documentation

## 2012-02-03 DIAGNOSIS — I10 Essential (primary) hypertension: Secondary | ICD-10-CM | POA: Insufficient documentation

## 2012-02-03 MED ORDER — PREDNISONE 20 MG PO TABS
60.0000 mg | ORAL_TABLET | Freq: Once | ORAL | Status: AC
  Administered 2012-02-03: 60 mg via ORAL
  Filled 2012-02-03: qty 3

## 2012-02-03 MED ORDER — PROMETHAZINE HCL 12.5 MG PO TABS
12.5000 mg | ORAL_TABLET | Freq: Once | ORAL | Status: AC
  Administered 2012-02-03: 12.5 mg via ORAL
  Filled 2012-02-03: qty 1

## 2012-02-03 MED ORDER — OXYCODONE-ACETAMINOPHEN 5-325 MG PO TABS
1.0000 | ORAL_TABLET | Freq: Once | ORAL | Status: AC
  Administered 2012-02-03: 1 via ORAL
  Filled 2012-02-03: qty 1

## 2012-02-03 MED ORDER — PREDNISONE 10 MG PO TABS: ORAL_TABLET | ORAL | Status: DC

## 2012-02-03 NOTE — ED Notes (Signed)
Pt states something bit her on her right leg last night

## 2012-02-03 NOTE — ED Provider Notes (Signed)
History     CSN: [id number]  Arrival date & time 02/03/12  [id number]   First MD Initiated Contact with Patient 02/03/12 1936      Chief Complaint  Patient presents with  . Insect Bite    (Consider location/radiation/quality/duration/timing/severity/associated sxs/prior treatment) HPI Comments: Patient states that for the past 2 days she's been noticing pain in her right hand. She at times has problems grasping objects in the right hand. She also has noticed some soreness in the right inner thigh. On last evening she noticed to insect bites on the right leg. Given the pain and discomfort she was having she became frightened that she may have been bitten by a spider in that this was causing the problem. She presents now for evaluation of these issues. She has not had any other rash. He's not had any difficulty with swelling or difficulty swallowing or wheezing. Patient states she has had some hyperventilation at night when she comes in from work, but states she is under a lot of stress at work and she sometimes has problems dealing with the stress.  The history is provided by the patient and the spouse.    Past Medical History  Diagnosis Date  . High cholesterol   . Hypertension   . Hemorrhoids   . S/P colonoscopy Jan 2011    Dr. Benson [place]: sessile polyp (benign lymphoid), large hemorrhoids, repeat 5-10 years    Past Surgical History  Procedure Date  . Abdominal hysterectomy   . Esophagogastroduodenoscopy 09/05/2011    Procedure: ESOPHAGOGASTRODUODENOSCOPY (EGD);  Surgeon: Daneil Dolin, MD;  Location: [facility];  Service: Endoscopy;  Laterality: N/A;  1:30  . Cholecystectomy 09/29/2011    Procedure: LAPAROSCOPIC CHOLECYSTECTOMY;  Surgeon: Jamesetta So, MD;  Location: [facility];  Service: General;  Laterality: N/A;  . Laparoscopic appendectomy 09/29/2011    Procedure: APPENDECTOMY LAPAROSCOPIC;  Surgeon: Jamesetta So, MD;  Location: [facility];  Service: General;;  incidental appendectomy     Family History  Problem Relation Age of Onset  . Colon cancer Neg Hx   . Hypertension Mother   . Coronary artery disease Mother   . Diabetes Mother   . Hypertension Sister   . Coronary artery disease Sister   . Hypertension Brother     History  Substance Use Topics  . Smoking status: Never Smoker   . Smokeless tobacco: Not on file  . Alcohol Use: No    OB History    Grav Para Term Preterm Abortions TAB SAB Ect Mult Living                  Review of Systems  Constitutional: Negative for activity change.       All ROS Neg except as noted in HPI  HENT: Negative for nosebleeds and neck pain.   Eyes: Negative for photophobia and discharge.  Respiratory: Negative for cough, shortness of breath and wheezing.   Cardiovascular: Negative for chest pain and palpitations.  Gastrointestinal: Negative for abdominal pain and blood in stool.  Genitourinary: Negative for dysuria, frequency and hematuria.  Musculoskeletal: Positive for arthralgias. Negative for back pain.  Skin: Negative.   Neurological: Negative for dizziness, seizures and speech difficulty.  Psychiatric/Behavioral: Negative for hallucinations and confusion. The patient is nervous/anxious.     Allergies  Review of patient's allergies indicates no known allergies.  Home Medications   Current Outpatient Rx  Name Route Sig Dispense Refill  . AMLODIPINE BESYLATE 10 MG PO TABS Oral Take 10  mg by mouth daily.     . ASPIRIN EC 81 MG PO TBEC Oral Take 81 mg by mouth daily.      . ATORVASTATIN CALCIUM 20 MG PO TABS Oral Take 20 mg by mouth at bedtime.     Marland Kitchen BIOTIN 5000 MCG PO TABS Oral Take 1 tablet by mouth daily as needed. Hair,nails    . GARLIC 123XX123 MG PO CAPS Oral Take 2 capsules by mouth daily.      Marland Kitchen HYDROCORTISONE ACETATE 25 MG RE SUPP Rectal Place 1 suppository (25 mg total) rectally every 12 (twelve) hours. For 7 days 14 suppository 1  . LOSARTAN POTASSIUM 100 MG PO TABS Oral Take 100 mg by mouth daily.       . OXYCODONE-ACETAMINOPHEN 5-325 MG PO TABS Oral Take 1-2 tablets by mouth every 4 (four) hours as needed. 40 tablet 0  . PANTOPRAZOLE SODIUM 40 MG PO TBEC Oral Take 1 tablet (40 mg total) by mouth daily. 30 tablet 0  . PREDNISONE 10 MG PO TABS  6,5,4,3,2,1 - take with food . 21 tablet 0  . TRIAMTERENE-HCTZ 37.5-25 MG PO TABS Oral Take 1 tablet by mouth daily.      BP 161/98  Pulse 97  Temp 98.7 F (37.1 C) (Oral)  Resp 18  Ht 5\' 3"  (1.6 m)  Wt 160 lb (72.576 kg)  BMI 28.34 kg/m2  SpO2 100%  Physical Exam  Nursing note and vitals reviewed. Constitutional: She is oriented to person, place, and time. She appears well-developed and well-nourished.  Non-toxic appearance.  HENT:  Head: Normocephalic.  Right Ear: Tympanic membrane and external ear normal.  Left Ear: Tympanic membrane and external ear normal.  Eyes: EOM and lids are normal. Pupils are equal, round, and reactive to light.  Neck: Normal range of motion. Neck supple. Carotid bruit is not present.       The muscles of the right and left shoulders extending into the neck are tight and tense. There is full range of motion of the neck.  Cardiovascular: Normal rate, regular rhythm, normal heart sounds, intact distal pulses and normal pulses.   Pulmonary/Chest: Breath sounds normal. No respiratory distress.  Abdominal: Soft. Bowel sounds are normal. There is no tenderness. There is no guarding.  Musculoskeletal: Normal range of motion.       There is a scabbed insect bite to the right inner thigh. There is a raised slightly red area of the dorsum of the upper right thigh. There is tightness and tenseness of the lateral portion of the quadricep on the right leg. There is soreness with range of motion of the knee and soreness with palpation over the anterior tibial tuberosity. The distal pulses and sensory are symmetrical. There is no change in temperature or size of the right and left lower extremity. 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  Lymphadenopathy:       Head (right side): No submandibular adenopathy present.       Head (left side): No submandibular adenopathy present.    She has no cervical adenopathy.  Neurological: She is alert and oriented to person, place, and time. She has normal strength. No cranial nerve deficit or sensory deficit.  Skin: Skin is warm and dry.  Psychiatric: She has a normal mood and affect. Her speech is normal.    ED Course  Procedures (including critical care time)  Labs Reviewed - No data  to display No results found.   1. Musculoskeletal pain   2. Insect bite       MDM  I have reviewed nursing notes, vital signs, and all appropriate lab and imaging results for this patient. Reassured the patient that the insect bites did not cause the joint or muscle pains. Demonstrated to the patient the change in temperature or and the degenerative joint disease deformity of the right hand. Suspect that the patient has degenerative joint disease complicated with musculoskeletal pain. Patient treated with prednisone daily with a meal. Patient states she has Percocet at home and continues that for her pain. Patient advised to see her primary physician for followup and management if not improving.       Lenox Ahr, [place] 02/03/12 2025

## 2012-02-03 NOTE — ED Notes (Signed)
Pt verbalized understanding of d/c instructions and with no further questions

## 2012-02-03 NOTE — ED Notes (Signed)
C/o right leg and right arm aching along with shoulders, noticed possible insect bite to right thigh, denies cough or congestion, denies N/V, states with SOB the other day and some today, no acute distress noted-pt able to say complete sentences and breathing is nonlabored

## 2012-02-03 NOTE — Discharge Instructions (Signed)
Please use warm tub soaks to right hand and leg daily for 15 to 20 min. Prednisone daily with food. Use tylenol or motrin for mild pain. Use your percocet for more severe pain. See your MD for additonal evaluation if not improving.Insect Bite Mosquitoes, flies, fleas, bedbugs, and other insects can bite. Insect bites are different from insect stings. The bite may be red, puffy (swollen), and itchy for 2 to 4 days. Most bites get better on their own. HOME CARE   Do not scratch the bite.   Keep the bite clean and dry. Wash the bite with soap and water.   Put ice on the bite.   Put ice in a plastic bag.   Place a towel between your skin and the bag.   Leave the ice on for 20 minutes, 4 times a day. Do this for the first 2 to 3 days, or as told by your doctor.   You may use medicated lotions or creams to lessen itching as told by your doctor.   Only take medicines as told by your doctor.   If you are given medicines (antibiotics), take them as told. Finish them even if you start to feel better.  You may need a tetanus shot if:  You cannot remember when you had your last tetanus shot.   You have never had a tetanus shot.   The injury broke your skin.  If you need a tetanus shot and you choose not to have one, you may get tetanus. Sickness from tetanus can be serious. GET HELP RIGHT AWAY IF:   You have more pain, redness, or puffiness.   You see a red line on the skin coming from the bite.   You have a fever.   You have joint pain.   You have a headache or neck pain.   You feel weak.   You have a rash.   You have chest pain, or you are short of breath.   You have belly (abdominal) pain.   You feel sick to your stomach (nauseous) or throw up (vomit).   You feel very tired or sleepy.  MAKE SURE YOU:   Understand these instructions.   Will watch your condition.   Will get help right away if you are not doing well or get worse.  Document Released: 07/28/2000 Document  Revised: 07/20/2011 Document Reviewed: 03/01/2011 [facility] Patient Information 2012 [place].Musculoskeletal Pain Musculoskeletal pain is muscle and boney aches and pains. These pains can occur in any part of the body. Your caregiver may treat you without knowing the cause of the pain. They may treat you if blood or urine tests, X-rays, and other tests were normal.  CAUSES There is often not a definite cause or reason for these pains. These pains may be caused by a type of germ (virus). The discomfort may also come from overuse. Overuse includes working out too hard when your body is not fit. Boney aches also come from weather changes. Bone is sensitive to atmospheric pressure changes. HOME CARE INSTRUCTIONS   Ask when your test results will be ready. Make sure you get your test results.   Only take over-the-counter or prescription medicines for pain, discomfort, or fever as directed by your caregiver. If you were given medications for your condition, do not drive, operate machinery or power tools, or sign legal documents for 24 hours. Do not drink alcohol. Do not take sleeping pills or other medications that may interfere with treatment.   Continue  all activities unless the activities cause more pain. When the pain lessens, slowly resume normal activities. Gradually increase the intensity and duration of the activities or exercise.   During periods of severe pain, bed rest may be helpful. Lay or sit in any position that is comfortable.   Putting ice on the injured area.   Put ice in a bag.   Place a towel between your skin and the bag.   Leave the ice on for 15 to 20 minutes, 3 to 4 times a day.   Follow up with your caregiver for continued problems and no reason can be found for the pain. If the pain becomes worse or does not go away, it may be necessary to repeat tests or do additional testing. Your caregiver may need to look further for a possible cause.  SEEK IMMEDIATE MEDICAL  CARE IF:  You have pain that is getting worse and is not relieved by medications.   You develop chest pain that is associated with shortness or breath, sweating, feeling sick to your stomach (nauseous), or throw up (vomit).   Your pain becomes localized to the abdomen.   You develop any new symptoms that seem different or that concern you.  MAKE SURE YOU:   Understand these instructions.   Will watch your condition.   Will get help right away if you are not doing well or get worse.  Document Released: 07/31/2005 Document Revised: 07/20/2011 Document Reviewed: 03/20/2008 [facility] Patient Information 2012 [place].

## 2012-02-05 NOTE — ED Provider Notes (Signed)
Medical screening examination/treatment/procedure(s) were performed by non-physician practitioner and as supervising physician I was immediately available for consultation/collaboration.   Sharyon Cable, MD 02/05/12 [phone]

## 2012-02-12 ENCOUNTER — Ambulatory Visit
Admission: RE | Admit: 2012-02-12 | Discharge: 2012-02-12 | Disposition: A | Discharge status: Home / Self Care | Source: Ambulatory Visit | Attending: Physician Assistant | Admitting: Physician Assistant

## 2012-02-12 ENCOUNTER — Other Ambulatory Visit: Payer: Self-pay | Admitting: Physician Assistant

## 2012-02-12 DIAGNOSIS — R609 Edema, unspecified: Secondary | ICD-10-CM

## 2012-02-12 DIAGNOSIS — R52 Pain, unspecified: Secondary | ICD-10-CM

## 2012-04-11 ENCOUNTER — Other Ambulatory Visit (HOSPITAL_COMMUNITY): Payer: Self-pay | Admitting: Nephrology

## 2012-04-11 DIAGNOSIS — N183 Chronic kidney disease, stage 3 unspecified: Secondary | ICD-10-CM

## 2012-04-16 ENCOUNTER — Ambulatory Visit (HOSPITAL_COMMUNITY)
Admission: RE | Admit: 2012-04-16 | Discharge: 2012-04-16 | Disposition: A | Discharge status: Home / Self Care | Source: Ambulatory Visit | Attending: Nephrology | Admitting: Nephrology

## 2012-04-16 DIAGNOSIS — N183 Chronic kidney disease, stage 3 unspecified: Secondary | ICD-10-CM

## 2012-04-16 DIAGNOSIS — Q619 Cystic kidney disease, unspecified: Secondary | ICD-10-CM | POA: Insufficient documentation

## 2012-06-04 ENCOUNTER — Other Ambulatory Visit: Payer: Self-pay | Admitting: Family Medicine

## 2012-06-04 DIAGNOSIS — N63 Unspecified lump in unspecified breast: Secondary | ICD-10-CM

## 2012-06-12 ENCOUNTER — Other Ambulatory Visit: Payer: Self-pay | Admitting: Family Medicine

## 2012-06-12 ENCOUNTER — Ambulatory Visit (HOSPITAL_COMMUNITY)
Admission: RE | Admit: 2012-06-12 | Discharge: 2012-06-12 | Disposition: A | Discharge status: Home / Self Care | Source: Ambulatory Visit | Attending: Family Medicine | Admitting: Family Medicine

## 2012-06-12 DIAGNOSIS — N63 Unspecified lump in unspecified breast: Secondary | ICD-10-CM

## 2012-08-27 ENCOUNTER — Other Ambulatory Visit: Payer: Self-pay | Admitting: Family Medicine

## 2012-08-27 ENCOUNTER — Ambulatory Visit (HOSPITAL_COMMUNITY)

## 2012-08-27 ENCOUNTER — Ambulatory Visit (HOSPITAL_COMMUNITY)
Admission: RE | Admit: 2012-08-27 | Discharge: 2012-08-27 | Disposition: A | Discharge status: Home / Self Care | Source: Ambulatory Visit | Attending: Family Medicine | Admitting: Family Medicine

## 2012-08-27 DIAGNOSIS — Z1231 Encounter for screening mammogram for malignant neoplasm of breast: Secondary | ICD-10-CM | POA: Insufficient documentation

## 2012-08-27 DIAGNOSIS — Z139 Encounter for screening, unspecified: Secondary | ICD-10-CM

## 2012-11-11 ENCOUNTER — Telehealth: Payer: Self-pay | Admitting: Family Medicine

## 2012-11-11 MED ORDER — NEBIVOLOL HCL 10 MG PO TABS: 10.0000 mg | ORAL_TABLET | Freq: Every day | ORAL | Status: DC

## 2012-11-11 NOTE — Telephone Encounter (Signed)
rx refilled.

## 2012-11-28 ENCOUNTER — Other Ambulatory Visit (HOSPITAL_COMMUNITY): Payer: Self-pay | Admitting: Family Medicine

## 2012-12-03 ENCOUNTER — Telehealth: Payer: Self-pay | Admitting: *Deleted

## 2012-12-03 NOTE — Telephone Encounter (Signed)
Left message on voice mail informing pt that her RX amlodipin is ready

## 2013-02-05 ENCOUNTER — Encounter: Payer: Self-pay | Admitting: Physician Assistant

## 2013-02-05 ENCOUNTER — Ambulatory Visit (INDEPENDENT_AMBULATORY_CARE_PROVIDER_SITE_OTHER): Admitting: Physician Assistant

## 2013-02-05 VITALS — BP 172/100 | HR 60 | Temp 97.5°F | Resp 18 | Ht 61.75 in | Wt 172.0 lb

## 2013-02-05 DIAGNOSIS — I10 Essential (primary) hypertension: Secondary | ICD-10-CM

## 2013-02-05 LAB — BASIC METABOLIC PANEL WITH GFR
BUN: 21 mg/dL (ref 6–23)
CO2: 28 mEq/L (ref 19–32)
Calcium: 9.5 mg/dL (ref 8.4–10.5)
Chloride: 103 mEq/L (ref 96–112)
Creat: 1.58 mg/dL — ABNORMAL HIGH (ref 0.50–1.10)
GFR, Est African American: 44 mL/min — ABNORMAL LOW
GFR, Est Non African American: 38 mL/min — ABNORMAL LOW
Glucose, Bld: 90 mg/dL (ref 70–99)
Potassium: 4.3 mEq/L (ref 3.5–5.3)
Sodium: 140 mEq/L (ref 135–145)

## 2013-02-05 MED ORDER — CLONIDINE HCL 0.1 MG PO TABS: 0.1000 mg | ORAL_TABLET | Freq: Two times a day (BID) | ORAL | Status: DC

## 2013-02-05 MED ORDER — NEBIVOLOL HCL 20 MG PO TABS: 20.0000 mg | ORAL_TABLET | Freq: Every day | ORAL | Status: DC

## 2013-02-05 NOTE — Progress Notes (Signed)
Patient ID: Megan [name] MRN: [id number], DOB: 03/30/1961, 52 y.o. Date of Encounter: 02/05/2013, 1:34 PM    Chief Complaint:  Chief Complaint  Patient presents with  . Hypertension     HPI: 52 y.o. year old AA female is here with her husband. She reports that she had headache for past 3 days so yesterday she started hecking BP. (prior to this, she had not checked BP). She brought in BP readings: 161/105, 152/91, etc. Range 148/88 to 181//88. She came here for f/u.   Her LOV here was 04/2012. At that time Amlodipine was conted at 10mg . Cozaar was increased to 100mg . Bystolic was increased to 10mg . I reviewed this with her and she and husband verify that she HAS been taking all of these as directed.   The only other medical care she has had since LOV here was visit "with kidney doctor in [place]"  (b/c [employer]) Pt has not had any other BP check.     Home Meds: See attached medication section for any medications that were entered at today's visit. The computer does not put those onto this list.The following list is a list of meds entered prior to today's visit.   Current Outpatient Prescriptions on File Prior to Visit  Medication Sig Dispense Refill  . amLODipine (NORVASC) 10 MG tablet TAKE 1 TABLET BY MOUTH EVERY DAY  90 tablet  3  . aspirin EC 81 MG tablet Take 81 mg by mouth daily.        Marland Kitchen losartan (COZAAR) 100 MG tablet Take 100 mg by mouth daily.        Marland Kitchen atorvastatin (LIPITOR) 20 MG tablet Take 20 mg by mouth at bedtime.       . Biotin 5000 MCG TABS Take 1 tablet by mouth daily as needed. Hair,nails      . Garlic 123XX123 MG CAPS Take 2 capsules by mouth daily.        Marland Kitchen oxyCODONE-acetaminophen (PERCOCET) 5-325 MG per tablet Take 1-2 tablets by mouth every 4 (four) hours as needed.  40 tablet  0  . pantoprazole (PROTONIX) 40 MG tablet Take 1 tablet (40 mg total) by mouth daily.  30 tablet  0  . predniSONE (DELTASONE) 10 MG tablet 6,5,4,3,2,1 - take with food .  21 tablet  0   . triamterene-hydrochlorothiazide (MAXZIDE-25) 37.5-25 MG per tablet Take 1 tablet by mouth daily.       No current facility-administered medications on file prior to visit.    Allergies: No Known Allergies    Review of Systems: See HPI for pertinent ROS. All other ROS negative.    Physical Exam: Blood pressure 172/100, pulse 60, temperature 97.5 F (36.4 C), temperature source Oral, resp. rate 18, height 5' 1.75" (1.568 m), weight 172 lb (78.019 kg)., Body mass index is 31.73 kg/(m^2). General: WNWD AAF. ANAD  Appears in no acute distress. Neck: Supple. No thyromegaly. No lymphadenopathy. No carotid bruit. Lungs: Clear bilaterally to auscultation without wheezes, rales, or rhonchi. Breathing is unlabored. Heart: Regular rhythm. No murmurs, rubs, or gallops. Abdomen: Soft, non-tender. No renal bruits.  Msk:  Strength and tone normal for age. Extremities/Skin: Warm and dry.  No edema.  Neuro: Alert and oriented X 3. Moves all extremities spontaneously. Gait is normal. CNII-XII grossly in tact. Psych:  Responds to questions appropriately with a normal affect.     ASSESSMENT AND PLAN:  52 y.o. year old female with  1. HYPERTENSION Uncontrolled. Will cont Norvasc 10mg  QD. Will  cont Cozaar 100mg  QD. Will increase Bystolic to 20mg  QD. Will add Clonidine 0.1mg  BID. Will check lab and U/S to R/O Renal Artery Stenosis.  Will schedule f/u OV here in 2 weeks.   - BASIC METABOLIC PANEL WITH GFR - US Renal; Future - US Renal Artery Stenosis; Future - Nebivolol HCl 20 MG TABS; Take 1 tablet (20 mg total) by mouth daily.  Dispense: 30 tablet; Refill: 3 - cloNIDine (CATAPRES) 0.1 MG tablet; Take 1 tablet (0.1 mg total) by mouth 2 (two) times daily.  Dispense: 60 tablet; Refill: 3   Signed, [address] [place], [place], [facility] 02/05/2013 1:34 PM

## 2013-02-07 ENCOUNTER — Encounter: Payer: Self-pay | Admitting: Family Medicine

## 2013-02-07 ENCOUNTER — Ambulatory Visit (HOSPITAL_COMMUNITY)
Admission: RE | Admit: 2013-02-07 | Discharge: 2013-02-07 | Disposition: A | Discharge status: Home / Self Care | Source: Ambulatory Visit | Attending: Physician Assistant | Admitting: Physician Assistant

## 2013-02-07 ENCOUNTER — Telehealth: Payer: Self-pay | Admitting: Family Medicine

## 2013-02-07 DIAGNOSIS — R9389 Abnormal findings on diagnostic imaging of other specified body structures: Secondary | ICD-10-CM | POA: Insufficient documentation

## 2013-02-07 DIAGNOSIS — I10 Essential (primary) hypertension: Secondary | ICD-10-CM

## 2013-02-07 NOTE — Telephone Encounter (Signed)
Pt called with lab results.  Has f/u appt for two weeks

## 2013-02-07 NOTE — Telephone Encounter (Signed)
Went and had renal ultrasound today.  C/O new medications making her feel dizzy.  Husband states she is very unbalanced today.  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  Will excuse from work TODAY only.  If cont into tomorrow will need further eval. (ED/Urgent care)  Suppose to return to work tomorrow.

## 2013-02-07 NOTE — Telephone Encounter (Signed)
Message copied by Olena Mater on Fri Feb 07, 2013 10:32 AM ------      Message from: Dena Billet      Created: Wed Feb 05, 2013  8:14 PM       I noted slight worsening in renal function-probably sec to uncontrolled HTN. Meds were adjusted at [facility] 02/05/13.       Tell pt lab stable and cont meds as directed at [place]. F/U OV 2 weeks as discussed at the [facility] today. ------

## 2013-02-08 NOTE — Telephone Encounter (Signed)
Call and see if dizziness any better now that she has been on new med several days.

## 2013-02-10 ENCOUNTER — Telehealth: Payer: Self-pay | Admitting: Physician Assistant

## 2013-02-10 NOTE — Telephone Encounter (Signed)
Megan [name] is working on this.

## 2013-02-10 NOTE — Telephone Encounter (Signed)
Called to make pt aware that we were working on this.

## 2013-02-11 ENCOUNTER — Other Ambulatory Visit

## 2013-02-12 NOTE — Telephone Encounter (Signed)
lmtrc

## 2013-02-13 ENCOUNTER — Ambulatory Visit
Admission: RE | Admit: 2013-02-13 | Discharge: 2013-02-13 | Disposition: A | Discharge status: Home / Self Care | Source: Ambulatory Visit | Attending: Physician Assistant | Admitting: Physician Assistant

## 2013-02-13 ENCOUNTER — Other Ambulatory Visit

## 2013-02-13 DIAGNOSIS — I10 Essential (primary) hypertension: Secondary | ICD-10-CM

## 2013-02-19 ENCOUNTER — Encounter: Payer: Self-pay | Admitting: Physician Assistant

## 2013-02-19 ENCOUNTER — Ambulatory Visit (INDEPENDENT_AMBULATORY_CARE_PROVIDER_SITE_OTHER): Admitting: Physician Assistant

## 2013-02-19 VITALS — BP 138/90 | HR 64 | Temp 97.8°F | Resp 18 | Ht 60.5 in | Wt 173.0 lb

## 2013-02-19 DIAGNOSIS — I10 Essential (primary) hypertension: Secondary | ICD-10-CM

## 2013-02-19 DIAGNOSIS — N183 Chronic kidney disease, stage 3 unspecified: Secondary | ICD-10-CM

## 2013-02-19 NOTE — Progress Notes (Signed)
   Patient ID: Megan [name] MRN: [id number], DOB: 08/19/1960, 52 y.o. Date of Encounter: 02/19/2013, 3:29 PM    Chief Complaint:  Chief Complaint  Patient presents with  . 2 week f/u BP    states feeling much better     HPI: 52 y.o. year old AA female here with her husband for f/u of her HTN.   04/2012: At that OV Amlodipine was continued at 10mg  QD. Cozaar was increased to 100mg . Bystolic was increased to 10mg . She did take those meds as directed.   02/05/13: She came for OV b/c she had dev h/a so she had started checking her BP at home. She got 150-160s/88-100 mostly. High was 181/88. She came for OV 6/25. At that time her BP was 172/100.              Continued Norvasc 10mg , Cozaar 100mg . Increased Bystolic to 20mg . Added Clonidine 0.1mg  BID.   Pt IS taking these meds as directed. She has no adv effects (x some dry mouth).  She went for renal artery u/s which showed no renal artery stenosis.   She has been checking BP at home gets 120-130/80s.     Home Meds: See attached medication section for any medications that were entered at today's visit. The computer does not put those onto this list.The following list is a list of meds entered prior to today's visit.   Current Outpatient Prescriptions on File Prior to Visit  Medication Sig Dispense Refill  . amLODipine (NORVASC) 10 MG tablet TAKE 1 TABLET BY MOUTH EVERY DAY  90 tablet  3  . aspirin EC 81 MG tablet Take 81 mg by mouth daily.        . cloNIDine (CATAPRES) 0.1 MG tablet Take 1 tablet (0.1 mg total) by mouth 2 (two) times daily.  60 tablet  3  . losartan (COZAAR) 100 MG tablet Take 100 mg by mouth daily.        . Nebivolol HCl 20 MG TABS Take 1 tablet (20 mg total) by mouth daily.  30 tablet  3   No current facility-administered medications on file prior to visit.    Allergies: No Known Allergies    Review of Systems: See HPI for pertinent ROS. All other ROS negative.    Physical Exam: Blood pressure 138/90, pulse  64, temperature 97.8 F (36.6 C), temperature source Oral, resp. rate 18, height 5' 0.5" (1.537 m), weight 173 lb (78.472 kg)., Body mass index is 33.22 kg/(m^2). General: WNWD AAF. Appears in no acute distress. Neck: Supple. No thyromegaly. No lymphadenopathy.No carotid bruit. Lungs: Clear bilaterally to auscultation without wheezes, rales, or rhonchi. Breathing is unlabored. Heart: Regular rhythm. No murmurs, rubs, or gallops. Msk:  Strength and tone normal for age. Extremities/Skin: Warm and dry. No clubbing or cyanosis. No edema. No rashes or suspicious lesions. Neuro: Alert and oriented X 3. Moves all extremities spontaneously. Gait is normal. CNII-XII grossly in tact. Psych:  Responds to questions appropriately with a normal affect.     ASSESSMENT AND PLAN:  52 y.o. year old female with  1. Hypertension S/p Renal Artery U/S 02/13/13--No renal artery stenosis BP now controlled. Cont current medications. No changes.   2.  CKD-Stage 3- Moderate- will need to monitor in future. Keep BP controlled.   Just did BMET 02/05/13. Does not need repeat now.   ROV 3 months, sooner if needed.   Marin Olp [place], [place], [facility] 02/19/2013 3:29 PM

## 2013-02-19 NOTE — Telephone Encounter (Signed)
Pt coming in for f/u appt today

## 2013-03-20 ENCOUNTER — Telehealth: Payer: Self-pay | Admitting: Family Medicine

## 2013-03-20 MED ORDER — FLUCONAZOLE 150 MG PO TABS: 150.0000 mg | ORAL_TABLET | Freq: Once | ORAL | Status: DC

## 2013-03-20 NOTE — Telephone Encounter (Signed)
Med sent to pharm and pt aware per vm

## 2013-03-20 NOTE — Telephone Encounter (Signed)
Ok, 150 mg po x1

## 2013-03-20 NOTE — Telephone Encounter (Signed)
?   OK to Refill  

## 2013-05-14 ENCOUNTER — Encounter (HOSPITAL_COMMUNITY): Payer: Self-pay | Admitting: *Deleted

## 2013-05-14 ENCOUNTER — Emergency Department (HOSPITAL_COMMUNITY)

## 2013-05-14 ENCOUNTER — Emergency Department (HOSPITAL_COMMUNITY)
Admission: EM | Admit: 2013-05-14 | Discharge: 2013-05-14 | Disposition: A | Discharge status: Home / Self Care | Attending: Emergency Medicine | Admitting: Emergency Medicine

## 2013-05-14 DIAGNOSIS — I129 Hypertensive chronic kidney disease with stage 1 through stage 4 chronic kidney disease, or unspecified chronic kidney disease: Secondary | ICD-10-CM | POA: Insufficient documentation

## 2013-05-14 DIAGNOSIS — Z7982 Long term (current) use of aspirin: Secondary | ICD-10-CM | POA: Insufficient documentation

## 2013-05-14 DIAGNOSIS — Z8639 Personal history of other endocrine, nutritional and metabolic disease: Secondary | ICD-10-CM | POA: Insufficient documentation

## 2013-05-14 DIAGNOSIS — N189 Chronic kidney disease, unspecified: Secondary | ICD-10-CM | POA: Insufficient documentation

## 2013-05-14 DIAGNOSIS — R079 Chest pain, unspecified: Secondary | ICD-10-CM

## 2013-05-14 DIAGNOSIS — Z79899 Other long term (current) drug therapy: Secondary | ICD-10-CM | POA: Insufficient documentation

## 2013-05-14 DIAGNOSIS — R0602 Shortness of breath: Secondary | ICD-10-CM | POA: Insufficient documentation

## 2013-05-14 DIAGNOSIS — Z862 Personal history of diseases of the blood and blood-forming organs and certain disorders involving the immune mechanism: Secondary | ICD-10-CM | POA: Insufficient documentation

## 2013-05-14 LAB — BASIC METABOLIC PANEL
BUN: 18 mg/dL (ref 6–23)
CO2: 28 mEq/L (ref 19–32)
Calcium: 9.2 mg/dL (ref 8.4–10.5)
Chloride: 106 mEq/L (ref 96–112)
Creatinine, Ser: 1.62 mg/dL — ABNORMAL HIGH (ref 0.50–1.10)
GFR calc Af Amer: 41 mL/min — ABNORMAL LOW (ref >90)
GFR calc non Af Amer: 36 mL/min — ABNORMAL LOW (ref >90)
Glucose, Bld: 128 mg/dL — ABNORMAL HIGH (ref 70–99)
Potassium: 3.8 mEq/L (ref 3.5–5.1)
Sodium: 140 mEq/L (ref 135–145)

## 2013-05-14 LAB — CBC
HCT: 39.9 % (ref 36.0–46.0)
Hemoglobin: 13.4 g/dL (ref 12.0–15.0)
MCH: 29 pg (ref 26.0–34.0)
MCHC: 33.6 g/dL (ref 30.0–36.0)
MCV: 86.4 fL (ref 78.0–100.0)
Platelets: 292 10*3/uL (ref 150–400)
RBC: 4.62 MIL/uL (ref 3.87–5.11)
RDW: 15.3 % (ref 11.5–15.5)
WBC: 5.9 10*3/uL (ref 4.0–10.5)

## 2013-05-14 LAB — TROPONIN I: Troponin I: <0.3 ng/mL (ref <0.30)

## 2013-05-14 LAB — D-DIMER, QUANTITATIVE: D-Dimer, Quant: 0.43 ug/mL-FEU (ref 0.00–0.48)

## 2013-05-14 MED ORDER — GI COCKTAIL ~~LOC~~
30.0000 mL | Freq: Once | ORAL | Status: AC
  Administered 2013-05-14: 30 mL via ORAL
  Filled 2013-05-14: qty 30

## 2013-05-14 MED ORDER — OMEPRAZOLE 20 MG PO CPDR: 20.0000 mg | DELAYED_RELEASE_CAPSULE | Freq: Every day | ORAL | Status: DC

## 2013-05-14 MED ORDER — MORPHINE SULFATE 4 MG/ML IJ SOLN
6.0000 mg | Freq: Once | INTRAMUSCULAR | Status: AC
  Administered 2013-05-14: 6 mg via INTRAVENOUS
  Filled 2013-05-14: qty 2

## 2013-05-14 NOTE — ED Notes (Signed)
Pt states CP began 5 days ago. Described as sharp at times with periods of dullness also. Pain is non radiating. Pain began at 0800 this morning and pt states pain is still there. Pt also states non productive cough began ~ 5 days ago also with periods of sob. NAD at this time.

## 2013-05-14 NOTE — ED Provider Notes (Signed)
CSN: [id number]     Arrival date & time 05/14/13  [id number] History  This chart was scribed for Megan Morn, MD by Roxan Diesel, ED scribe.  This patient was seen in room APA08/APA08 and the patient's care was started at 9:51 AM.   Chief Complaint  Patient presents with  . Chest Pain    The history is provided by the patient. No language interpreter was used.    HPI Comments: Megan [name] is a 52 y.o. female with h/o HTN, high cholesterol and borderline DM who presents to the Emergency Department complaining of right-sided chest pain that began 4-5 days ago with associated mild SOB.  Pt states her pain was occurring in intermittent episodes lasting several minutes for the first 3-4 days, but today her pain came on this morning 2 hours ago and has been constant since then.  She describes it as a painful "fluttering" sensation that comes on without any apparent cause.  Pain is non-radiating.  She denies any specific precipitating factors that bring on pain and states it can come on when she is resting.  It is not exacerbated by exertion, movement or palpation.  She has not taken any pain medications pta.  She also complains of mild SOB that accompanies her pain.  Pt denies personal h/o heart problems.  She notes that her mother died of an MI and her 38 year old sister had a defibrillator placed recently for viral cardiomyopathy.  She does not smoke.  She is post-menopausal.   Past Medical History  Diagnosis Date  . High cholesterol   . Hypertension   . Hemorrhoids   . S/P colonoscopy Jan 2011    Dr. Benson Norway: sessile polyp (benign lymphoid), large hemorrhoids, repeat 5-10 years  . Chronic kidney disease     Past Surgical History  Procedure Laterality Date  . Abdominal hysterectomy    . Esophagogastroduodenoscopy  09/05/2011    Procedure: ESOPHAGOGASTRODUODENOSCOPY (EGD);  Surgeon: Daneil Dolin, MD;  Location: [facility];  Service: Endoscopy;  Laterality: N/A;  1:30  .  Cholecystectomy  09/29/2011    Procedure: LAPAROSCOPIC CHOLECYSTECTOMY;  Surgeon: Jamesetta So, MD;  Location: [facility];  Service: General;  Laterality: N/A;  . Laparoscopic appendectomy  09/29/2011    Procedure: APPENDECTOMY LAPAROSCOPIC;  Surgeon: Jamesetta So, MD;  Location: [facility];  Service: General;;  incidental appendectomy    Family History  Problem Relation Age of Onset  . Colon cancer Neg Hx   . Hypertension Mother   . Coronary artery disease Mother   . Diabetes Mother   . Hypertension Sister   . Coronary artery disease Sister   . Hypertension Brother     History  Substance Use Topics  . Smoking status: Never Smoker   . Smokeless tobacco: Not on file  . Alcohol Use: No    OB History   Grav Para Term Preterm Abortions TAB SAB Ect Mult Living                  Review of Systems A complete 10 system review of systems was obtained and all systems are negative except as noted in the HPI and PMH.    Allergies  Review of patient's allergies indicates no known allergies.  Home Medications   Current Outpatient Rx  Name  Route  Sig  Dispense  Refill  . amLODipine (NORVASC) 10 MG tablet      TAKE 1 TABLET BY MOUTH EVERY DAY   90 tablet  3   . aspirin EC 81 MG tablet   Oral   Take 81 mg by mouth daily.           . cloNIDine (CATAPRES) 0.1 MG tablet   Oral   Take 1 tablet (0.1 mg total) by mouth 2 (two) times daily.   60 tablet   3   . fluconazole (DIFLUCAN) 150 MG tablet   Oral   Take 1 tablet (150 mg total) by mouth once.   1 tablet   0   . losartan (COZAAR) 100 MG tablet   Oral   Take 100 mg by mouth daily.           . Nebivolol HCl 20 MG TABS   Oral   Take 1 tablet (20 mg total) by mouth daily.   30 tablet   3    BP 153/92  Pulse 68  Temp(Src) 98.3 F (36.8 C) (Oral)  Resp 15  Ht 5\' 3"  (1.6 m)  Wt 170 lb (77.111 kg)  BMI 30.12 kg/m2  SpO2 98%  Physical Exam  Nursing note and vitals reviewed. Constitutional: She is oriented to  person, place, and time. She appears well-developed and well-nourished. No distress.  HENT:  Head: Normocephalic and atraumatic.  Eyes: EOM are normal.  Neck: Normal range of motion.  Cardiovascular: Normal rate, regular rhythm and normal heart sounds.   No murmur heard. Pulmonary/Chest: Effort normal and breath sounds normal. No respiratory distress. She has no wheezes. She has no rales. She exhibits no tenderness.  No tenderness of right anterior chest wall  Abdominal: Soft. She exhibits no distension. There is no tenderness.  Musculoskeletal: Normal range of motion.  Normal movement of right shoulder  Neurological: She is alert and oriented to person, place, and time.  Skin: Skin is warm and dry.  Psychiatric: She has a normal mood and affect. Judgment normal.    ED Course  Procedures (including critical care time)  ECG interpretation   Date: 05/14/2013  Rate: 67  Rhythm: normal sinus rhythm  QRS Axis: normal  Intervals: normal  ST/T Wave abnormalities: normal  Conduction Disutrbances: none  Narrative Interpretation:   Old EKG Reviewed: No significant changes noted     DIAGNOSTIC STUDIES: Oxygen Saturation is 98% on room air, normal by my interpretation.    COORDINATION OF CARE: 10:00 AM-Discussed treatment plan which includes pain medication and cardiac workup with pt at bedside and pt agrees to plan.   11:50 AM: On recheck pt states pain medication helped somewhat but her pain is still present.  Informed pt that labs and imaging are not concerning for any emergent cardiac conditions.  Discussed treatment plan which includes GI cocktail.  Pt agrees to plan.   1:04 PM: Pt states she feels much better after GI cocktail and is agreeable to discharge home.  Labs Review Labs Reviewed  BASIC METABOLIC PANEL - Abnormal; Notable for the following:    Glucose, Bld 128 (*)    Creatinine, Ser 1.62 (*)    GFR calc non Af Amer 36 (*)    GFR calc Af Amer 41 (*)    All other  components within normal limits  CBC  TROPONIN I  D-DIMER, QUANTITATIVE    Imaging Review Dg Chest 2 View  05/14/2013   CLINICAL DATA:  Right chest pressure, cough  EXAM: CHEST  2 VIEW  COMPARISON:  08/19/2011  FINDINGS: Lungs are clear. No pleural effusion or pneumothorax.  Heart is normal in size.  Visualized  osseous structures are within normal limits.  IMPRESSION: No evidence of acute cardiopulmonary disease.   Electronically Signed   By: Julian Hy M.D.   On: 05/14/2013 10:40    MDM  No diagnosis found. D-dimer negative.  EKG normal sinus rhythm.  Doubt ACS.  Pain improved with GI cocktail.  I suspect a lot of this is due to reflux.  Discharge home in good condition.  She understands to return to the ER for new or worsening symptoms    I personally performed the services described in this documentation, which was scribed in my presence. The recorded information has been reviewed and is accurate.      Megan Morn, MD 05/14/13 [phone]

## 2013-05-14 NOTE — ED Notes (Signed)
Pt having to be restuck by phlebotomy due to hemolysis from iv stick.

## 2013-05-22 ENCOUNTER — Encounter: Payer: Self-pay | Admitting: Physician Assistant

## 2013-05-22 ENCOUNTER — Ambulatory Visit (INDEPENDENT_AMBULATORY_CARE_PROVIDER_SITE_OTHER): Admitting: Physician Assistant

## 2013-05-22 VITALS — BP 142/96 | HR 60 | Temp 97.4°F | Resp 18 | Ht 61.0 in | Wt 175.0 lb

## 2013-05-22 DIAGNOSIS — K219 Gastro-esophageal reflux disease without esophagitis: Secondary | ICD-10-CM | POA: Insufficient documentation

## 2013-05-22 DIAGNOSIS — E669 Obesity, unspecified: Secondary | ICD-10-CM

## 2013-05-22 DIAGNOSIS — N189 Chronic kidney disease, unspecified: Secondary | ICD-10-CM

## 2013-05-22 DIAGNOSIS — I10 Essential (primary) hypertension: Secondary | ICD-10-CM

## 2013-05-22 DIAGNOSIS — M940 Chondrocostal junction syndrome [Tietze]: Secondary | ICD-10-CM

## 2013-05-22 DIAGNOSIS — E785 Hyperlipidemia, unspecified: Secondary | ICD-10-CM

## 2013-05-22 DIAGNOSIS — E119 Type 2 diabetes mellitus without complications: Secondary | ICD-10-CM | POA: Insufficient documentation

## 2013-05-22 LAB — HEPATIC FUNCTION PANEL
ALT: 13 U/L (ref 0–35)
AST: 13 U/L (ref 0–37)
Albumin: 3.7 g/dL (ref 3.5–5.2)
Alkaline Phosphatase: 101 U/L (ref 39–117)
Bilirubin, Direct: 0.1 mg/dL (ref 0.0–0.3)
Indirect Bilirubin: 0.3 mg/dL (ref 0.0–0.9)
Total Bilirubin: 0.4 mg/dL (ref 0.3–1.2)
Total Protein: 6.8 g/dL (ref 6.0–8.3)

## 2013-05-22 LAB — LIPID PANEL
Cholesterol: 254 mg/dL — ABNORMAL HIGH (ref 0–200)
HDL: 42 mg/dL (ref >39)
LDL Cholesterol: 185 mg/dL — ABNORMAL HIGH (ref 0–99)
Total CHOL/HDL Ratio: 6 Ratio
Triglycerides: 136 mg/dL (ref <150)
VLDL: 27 mg/dL (ref 0–40)

## 2013-05-22 LAB — HEMOGLOBIN A1C
Hgb A1c MFr Bld: 6.6 % — ABNORMAL HIGH (ref <5.7)
Mean Plasma Glucose: 143 mg/dL — ABNORMAL HIGH (ref <117)

## 2013-05-22 MED ORDER — CLONIDINE HCL 0.2 MG PO TABS: 0.2000 mg | ORAL_TABLET | Freq: Two times a day (BID) | ORAL | Status: DC

## 2013-05-22 MED ORDER — OMEPRAZOLE 20 MG PO CPDR: 20.0000 mg | DELAYED_RELEASE_CAPSULE | Freq: Every day | ORAL | Status: DC

## 2013-05-22 NOTE — Progress Notes (Signed)
Patient ID: DELIANA FROGGE MRN: [id number], DOB: Feb 06, 1961, 52 y.o. Date of Encounter: @DATE @  Chief Complaint:  Chief Complaint  Patient presents with  . 3 mth check up    is fasting,  ck up after recent visit to ED    HPI: 52 y.o. year old AA female  presents with her husband for regular office visit today.  She went to the emergency room on 05/14/13 with complaints of chest pain. Cardiac enzymes and d-dimer were negative. Chest x-ray and EKG were negative. Patient states that she had been experiencing some sensation of something in her throat at times. As well she says that in the ER she was given GI cocktail and this did relieve her symptoms. She was sent out with a 30 day prescription of omeprazole. She has been taking this daily. She says that the chest discomfort that she had been experiencing was coming on at any time. Would often happen at rest. Did not increase with exertion. However she also was noticing any relation to food intake. She works at the [employer] in [employer]. 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 She has been noticing recently some achy discomfort across her neck shoulders and arms.  She is taking all of her blood pressure medications as directed. No adverse effects. She has not been checking her blood pressure at home very much recently.  I noted that she had been on cholesterol medications when seen here in the past. However when I asked her what happened with this she isn't really sure. Her answer is that "my cholesterol was good so I did need the medicine". Says she never had any adverse effects to cholesterol medicines.    Past Medical History  Diagnosis Date  . High cholesterol   . Hypertension   . Hemorrhoids   . S/P colonoscopy Jan 2011    Dr. Benson Norway: sessile polyp (benign lymphoid), large hemorrhoids, repeat 5-10 years  . Chronic kidney disease    . GERD (gastroesophageal reflux disease)   . Diabetes mellitus without complication      Home Meds: See attached medication section for current medication list. Any medications entered into computer today will not appear on this note's list. The medications listed below were entered prior to today. Current Outpatient Prescriptions on File Prior to Visit  Medication Sig Dispense Refill  . amLODipine (NORVASC) 10 MG tablet Take 10 mg by mouth daily.      Marland Kitchen aspirin EC 81 MG tablet Take 81 mg by mouth daily.        Marland Kitchen losartan (COZAAR) 100 MG tablet Take 100 mg by mouth daily.        . Nebivolol HCl 20 MG TABS Take 1 tablet (20 mg total) by mouth daily.  30 tablet  3   No current facility-administered medications on file prior to visit.    Allergies: No Known Allergies  History   Social History  . Marital Status: Married    Spouse Name: N/A    Number of Children: N/A  . 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 Smoking status: Never Smoker   . Smokeless tobacco: Not on file  . Alcohol Use: No  . Drug Use: No  . Sexual Activity: Yes   Other Topics Concern  .  Not on file   Social History Narrative   Works at [employer] in [place].    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   Also has to get items from high shelves-causes achy pain in shoulder area      Married.   Children are grown, out of house.    Family History  Problem Relation Age of Onset  . Colon cancer Neg Hx   . Hypertension Mother   . Coronary artery disease Mother   . Diabetes Mother   . Hypertension Sister   . Coronary artery disease Sister   . Hypertension Brother      Review of Systems:  See HPI for pertinent ROS. All other ROS negative.    Physical Exam: Blood pressure 142/96, pulse 60, temperature 97.4 F (36.3 C), temperature source Oral, resp. rate 18, height 5\' 1"  (1.549 m), weight 175 lb (79.379 kg)., Body mass index is 33.08  kg/(m^2). General: Mildly obese African American female .Appears in no acute distress. Neck: Supple. No thyromegaly. No lymphadenopathy. No carotid bruits. Lungs: Clear bilaterally to auscultation without wheezes, rales, or rhonchi. Breathing is unlabored. Heart: RRR with S1 S2. No murmurs, rubs, or gallops. Chest wall: I palpated her chest wall and there is one area of significant tenderness in the right Peri- Sternal area. No other areas of tenderness. Abdomen: Soft, non-tender, non-distended with normoactive bowel sounds. No hepatomegaly. No rebound/guarding. No obvious abdominal masses. Musculoskeletal:  Strength and tone normal for age. Extremities/Skin: Warm and dry. No clubbing or cyanosis. No edema. No rashes or suspicious lesions. Neuro: Alert and oriented X 3. Moves all extremities spontaneously. Gait is normal. CNII-XII grossly in tact. Psych:  Responds to questions appropriately with a normal affect.     ASSESSMENT AND PLAN:  52 y.o. year old female with  1. HYPERTENSION Suboptimal. Increase clonidine to 0.2 mg twice a day. Continue all other medications the same. She has a blood pressure cuff at home. She is going to start back checking her blood pressure on a routine basis. If she gets readings 140/90 or greater she is to call me. Otherwise followup office visit in 3 months. - cloNIDine (CATAPRES) 0.2 MG tablet; Take 1 tablet (0.2 mg total) by mouth 2 (two) times daily.  Dispense: 60 tablet; Refill: 11  2. HYPERLIPIDEMIA I noted at the time of her office visit 03/13/11 she is on Lipitor 20 mg. I have reviewed lab results and lab notes as well as office notes since that time. I see nothing that states she was supposed to stop the Lipitor. I think that when she was told  "cholesterol is good" with some of these lab results, she understood this to mean that her cholesterol was good and she did not need any cholesterol medicine. Recheck lab now. We'll restart statin if needed. -  Lipid panel - Hepatic function panel  3. Diabetes mellitus without complication Hemoglobin [id number] was 6.5 on 03/13/11. Somehow after that, she was not coming in for regular office visits. Therefore monitoring this as well as her cholesterol had gotten dropped. - Hemoglobin A1c  4. OBESITY  5. Chronic kidney disease I discussed with her today that her kidney numbers are slightly high. Discussed with father the leading causes of kidney disease or uncontrolled blood pressure and uncontrolled diabetes. Discussed with her the necessity to keep her blood pressure and sugar are well monitored and well controlled.  6. GERD (gastroesophageal reflux disease) Sounds like the chest pain she  is experiencing at the recent ER visit probably was secondary to GERD. As well when I was reviewing her chart she was seen here January 2013. Prior to that visit she had gone to the ER with chest pain and was diagnosed with GERD at that time as well. I reviewed all this with the patient and her husband today. Told her she needs to continue the omeprazole. Therefore I am sending in a whole year of refills. - omeprazole (PRILOSEC) 20 MG capsule; Take 1 capsule (20 mg total) by mouth daily.  Dispense: 30 capsule; Refill: 11  7. Costochondritis Today she does have some tenderness with palpation at the right parasternal area. As well she states that at times she does have musculoskeletal aches and pains secondary to the lifting she does at work. I think that her current chest pain she has today he is somewhat secondary to this. However she says that at the time of the ER visit there was no tenderness with palpation of the chest wall. The think that the chest pain she has been experiencing have been a combination of both GERD and musculoskeletal chest pain. Discussed with her in the future if she is noticing chest pain to be a layer of whether or not there is any tenderness and pain in the muscle area.  He is to monitor her blood  pressure routinely with her cuff at home. Call me if she is getting consistent readings close to 140/90 or any readings higher. I will followup her FLP, LFT, A1c. Otherwise she will have a followup office visit in 3 months. Again I discussed with both the patient and her husband the need for close monitoring and treatment of the above conditions.  At the next office visit I will discuss with her scheduling a complete physical exam as well so that we can update our preventive care.   Signed, [address] [place], [place], BSFM 05/22/2013 9:10 AM

## 2013-06-12 ENCOUNTER — Telehealth: Payer: Self-pay | Admitting: Family Medicine

## 2013-06-12 MED ORDER — CYCLOBENZAPRINE HCL 10 MG PO TABS: 10.0000 mg | ORAL_TABLET | Freq: Three times a day (TID) | ORAL | Status: DC

## 2013-06-12 NOTE — Telephone Encounter (Signed)
I haven't seen patient for this.  I would want to see her unless you feel it was not cardiac.

## 2013-06-12 NOTE — Telephone Encounter (Signed)
Pt is having some sob with deep breathing and when bending over. No arm pain. Per WTP will try flexeril 10 mg q 8 hours and if no better by Monday NTBS.

## 2013-06-12 NOTE — Telephone Encounter (Signed)
Pt states that she is having chest pain again x 3 days. She states that she is taking her omeprazole qd. She was wondering if a muscle relaxer would help? She did got to ER and had a full work up done - See note in epic.

## 2013-06-12 NOTE — Telephone Encounter (Signed)
.  Patient aware, appt. Made for Monday and med sent to pharm. If pt is better by Monday she will call and cxd appt.

## 2013-06-12 NOTE — Telephone Encounter (Signed)
LMTRC

## 2013-06-16 ENCOUNTER — Encounter: Payer: Self-pay | Admitting: Family Medicine

## 2013-06-16 ENCOUNTER — Ambulatory Visit (INDEPENDENT_AMBULATORY_CARE_PROVIDER_SITE_OTHER): Admitting: Family Medicine

## 2013-06-16 VITALS — BP 130/80 | HR 76 | Temp 97.0°F | Resp 16 | Wt 174.0 lb

## 2013-06-16 DIAGNOSIS — K279 Peptic ulcer, site unspecified, unspecified as acute or chronic, without hemorrhage or perforation: Secondary | ICD-10-CM

## 2013-06-16 DIAGNOSIS — R079 Chest pain, unspecified: Secondary | ICD-10-CM

## 2013-06-16 MED ORDER — PANTOPRAZOLE SODIUM 40 MG PO TBEC: 40.0000 mg | DELAYED_RELEASE_TABLET | Freq: Two times a day (BID) | ORAL | Status: DC

## 2013-06-16 MED ORDER — SUCRALFATE 1 G PO TABS: 1.0000 g | ORAL_TABLET | Freq: Four times a day (QID) | ORAL | Status: DC

## 2013-06-16 NOTE — Progress Notes (Signed)
Subjective:    Patient ID: Megan [name], female    DOB: 1961/02/02, 52 y.o.   MRN: [id number]  HPI  Patient is a very pleasant 52 year old African American female who went to the emergency room October 1 with atypical right-sided chest pain. Initial cardiac enzymes, chest x-ray, and EKG within normal limits. She was diagnosed with GERD and discharged home on omeprazole 20 mg by mouth daily.   She continues to have the pain numerous times throughout the day. It is to be located just to the right of her sternum and radiates into her left chest. There is also some mild shortness of breath associated with it. Other exacerbating factors include lying down or bending over.  She also occasionally feels like her throat is swelling or that something is sticking in her throat.  She denies any pleurisy. She denies any hemoptysis. She has no pain with palpation of her sternum. She has no rib pain to palpation. She's not on any hormone replacement. She has no family history of pulmonary embolisms.   Past Medical History  Diagnosis Date  . High cholesterol   . Hypertension   . Hemorrhoids   . S/P colonoscopy Jan 2011    Dr. Benson Norway: sessile polyp (benign lymphoid), large hemorrhoids, repeat 5-10 years  . Chronic kidney disease   . GERD (gastroesophageal reflux disease)   . Diabetes mellitus without complication    Past Surgical History  Procedure Laterality Date  . Abdominal hysterectomy    . Esophagogastroduodenoscopy  09/05/2011    Procedure: ESOPHAGOGASTRODUODENOSCOPY (EGD);  Surgeon: Daneil Dolin, MD;  Location: [facility];  Service: Endoscopy;  Laterality: N/A;  1:30  . Cholecystectomy  09/29/2011    Procedure: LAPAROSCOPIC CHOLECYSTECTOMY;  Surgeon: Jamesetta So, MD;  Location: [facility];  Service: General;  Laterality: N/A;  . Laparoscopic appendectomy  09/29/2011    Procedure: APPENDECTOMY LAPAROSCOPIC;  Surgeon: Jamesetta So, MD;  Location: [facility];  Service: General;;  incidental  appendectomy   Current Outpatient Prescriptions on File Prior to Visit  Medication Sig Dispense Refill  . amLODipine (NORVASC) 10 MG tablet Take 10 mg by mouth daily.      Marland Kitchen aspirin EC 81 MG tablet Take 81 mg by mouth daily.        . cloNIDine (CATAPRES) 0.2 MG tablet Take 1 tablet (0.2 mg total) by mouth 2 (two) times daily.  60 tablet  11  . cyclobenzaprine (FLEXERIL) 10 MG tablet Take 1 tablet (10 mg total) by mouth every 8 (eight) hours.  30 tablet  0  . losartan (COZAAR) 100 MG tablet Take 100 mg by mouth daily.        . Nebivolol HCl 20 MG TABS Take 1 tablet (20 mg total) by mouth daily.  30 tablet  3  . omeprazole (PRILOSEC) 20 MG capsule Take 1 capsule (20 mg total) by mouth daily.  30 capsule  11   No current facility-administered medications on file prior to visit.   No Known Allergies History   Social History  . Marital Status: Married    Spouse Name: N/A    Number of Children: N/A  . 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 Smoking status: Never Smoker   . Smokeless tobacco: Not on file  . Alcohol Use: No  . Drug Use: No  . Sexual Activity: Yes   Other Topics Concern  . Not on file  Social History Narrative   Works at [employer] in [place].    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   Also has to get items from high shelves-causes achy pain in shoulder area      Married.   Children are grown, out of house.     Review of Systems  All other systems reviewed and are negative.       Objective:   Physical Exam  Vitals reviewed. Neck: Neck supple. No JVD present. No thyromegaly present.  Cardiovascular: Normal rate, regular rhythm, normal heart sounds and intact distal pulses.  Exam reveals no gallop and no friction rub.   No murmur heard. Pulmonary/Chest: Effort normal and breath sounds normal. No respiratory distress. She has no wheezes. She has no rales. She exhibits no tenderness.   Abdominal: Soft. Bowel sounds are normal. She exhibits no distension and no mass. There is no tenderness. There is no rebound and no guarding.  Musculoskeletal: She exhibits no edema.  Lymphadenopathy:    She has no cervical adenopathy.          Assessment & Plan:  1. Peptic ulcer disease Concern the patient may have an ulcer in the stomach or possibly a hiatal hernia.  Switch omeprazole to protonix 40 mg by mouth twice a day and add Carafate 1 g by mouth Q. a.c. and at bedtime. Recheck in 2 weeks. If symptoms are not improved I recommend an EGD as well as a chest CT - pantoprazole (PROTONIX) 40 MG tablet; Take 1 tablet (40 mg total) by mouth 2 (two) times daily.  Dispense: 60 tablet; Refill: 1 - sucralfate (CARAFATE) 1 G tablet; Take 1 tablet (1 g total) by mouth 4 (four) times daily.  Dispense: 120 tablet; Refill: 1  2. Chest pain, unspecified Differential diagnosis includes pericarditis, GERD, peptic ulcer disease, pulmonary pathology, or coronary artery disease.  Given her age, her hyperlipidemia, and her hypertension I will schedule patient for an outpatient stress test although I believe this is unlikely to be her heart.  Review of the EKG obtained in the emergency room shows no evidence of pericarditis. - Ambulatory referral to Cardiology

## 2013-06-19 ENCOUNTER — Other Ambulatory Visit: Payer: Self-pay

## 2013-07-04 ENCOUNTER — Ambulatory Visit (INDEPENDENT_AMBULATORY_CARE_PROVIDER_SITE_OTHER): Admitting: Cardiology

## 2013-07-04 ENCOUNTER — Other Ambulatory Visit

## 2013-07-04 ENCOUNTER — Encounter: Payer: Self-pay | Admitting: Cardiology

## 2013-07-04 VITALS — BP 140/82 | HR 70 | Ht 62.0 in | Wt 176.0 lb

## 2013-07-04 DIAGNOSIS — E785 Hyperlipidemia, unspecified: Secondary | ICD-10-CM

## 2013-07-04 DIAGNOSIS — E119 Type 2 diabetes mellitus without complications: Secondary | ICD-10-CM

## 2013-07-04 DIAGNOSIS — Z79899 Other long term (current) drug therapy: Secondary | ICD-10-CM

## 2013-07-04 DIAGNOSIS — I10 Essential (primary) hypertension: Secondary | ICD-10-CM

## 2013-07-04 DIAGNOSIS — R0789 Other chest pain: Secondary | ICD-10-CM

## 2013-07-04 DIAGNOSIS — E782 Mixed hyperlipidemia: Secondary | ICD-10-CM

## 2013-07-04 MED ORDER — LOSARTAN POTASSIUM 100 MG PO TABS: 100.0000 mg | ORAL_TABLET | Freq: Every day | ORAL | Status: DC

## 2013-07-04 NOTE — Assessment & Plan Note (Addendum)
Blood pressure mildly elevated today. No change made to current regimen.

## 2013-07-04 NOTE — Patient Instructions (Signed)
  Your physician recommends that you schedule a follow-up appointment  As needed We will call you with results  Your physician has requested that you have a stress echocardiogram. For further information please visit [email or web address]. Please follow instruction sheet as given.    Your physician recommends that you continue on your current medications as directed. Please refer to the Current Medication list given to you today.

## 2013-07-04 NOTE — Assessment & Plan Note (Addendum)
Managed with omega-3 supplements. Keep follow up with Dr. Dennard Schaumann.

## 2013-07-04 NOTE — Assessment & Plan Note (Addendum)
Diet controlled.  

## 2013-07-04 NOTE — Progress Notes (Signed)
Clinical Summary Megan [name] is a 52 y.o.female referred for cardiology consultation by Dr. Dennard Schaumann. She has been experiencing chest discomfort, diagnosed with possible peptic ulcer disease and placed on PPI. Additional workup at ER visit in October includes unremarkable chest x-ray, ECG with nonspecific ST changes, normal troponin I, normal d-dimer.  She states that symptoms began fairly suddenly, dull throbbing discomfort in the right side of her chest radiating toward the middle. She states that GI cocktail in the ER helped quite a bit. She has not had complete resolution of symptoms on PPI, dose has been increased. She reports no exertional component to her chest discomfort, no breathlessness with activity.  Record review finds prior normal exercise echocardiogram in September 2011.   No Known Allergies  Current Outpatient Prescriptions  Medication Sig Dispense Refill  . amLODipine (NORVASC) 10 MG tablet Take 10 mg by mouth daily.      Marland Kitchen aspirin EC 81 MG tablet Take 81 mg by mouth daily.        . cloNIDine (CATAPRES) 0.2 MG tablet Take 1 tablet (0.2 mg total) by mouth 2 (two) times daily.  60 tablet  11  . losartan (COZAAR) 100 MG tablet Take 1 tablet (100 mg total) by mouth daily.  90 tablet  4  . Nebivolol HCl 20 MG TABS Take 1 tablet (20 mg total) by mouth daily.  30 tablet  3  . Omega-3 Fatty Acids (FISH OIL) 1000 MG CAPS Take by mouth.      Marland Kitchen omeprazole (PRILOSEC) 20 MG capsule Take 1 capsule (20 mg total) by mouth daily.  30 capsule  11  . pantoprazole (PROTONIX) 40 MG tablet Take 1 tablet (40 mg total) by mouth 2 (two) times daily.  60 tablet  1  . sucralfate (CARAFATE) 1 G tablet Take 1 tablet (1 g total) by mouth 4 (four) times daily.  120 tablet  1   No current facility-administered medications for this visit.    Past Medical History  Diagnosis Date  . Mixed hyperlipidemia   . Essential hypertension, benign   . Hemorrhoids   . S/P colonoscopy Jan 2011    Dr.  Benson Norway: sessile polyp (benign lymphoid), large hemorrhoids, repeat 5-10 years  . CKD (chronic kidney disease) stage 3, GFR 30-59 ml/min   . GERD (gastroesophageal reflux disease)   . Type 2 diabetes mellitus     Past Surgical History  Procedure Laterality Date  . Abdominal hysterectomy    . Esophagogastroduodenoscopy  09/05/2011    Procedure: ESOPHAGOGASTRODUODENOSCOPY (EGD);  Surgeon: Daneil Dolin, MD;  Location: [facility];  Service: Endoscopy;  Laterality: N/A;  1:30  . Cholecystectomy  09/29/2011    Procedure: LAPAROSCOPIC CHOLECYSTECTOMY;  Surgeon: Jamesetta So, MD;  Location: [facility];  Service: General;  Laterality: N/A;  . Laparoscopic appendectomy  09/29/2011    Procedure: APPENDECTOMY LAPAROSCOPIC;  Surgeon: Jamesetta So, MD;  Location: [facility];  Service: General;;  incidental appendectomy    Family History  Problem Relation Age of Onset  . Colon cancer Neg Hx   . Hypertension Mother   . Coronary artery disease Mother   . Diabetes Mother   . Hypertension Sister   . Coronary artery disease Sister   . Hypertension Brother     Social History Megan [name] reports that she has never smoked. She does not have any smokeless tobacco history on file. Megan [name] reports that she does not drink alcohol.  Review of Systems No cough, hemoptysis,  fevers or chills, abdominal pain, change in bowel habits. Otherwise negative except as outlined.  Physical Examination Filed Vitals:   07/04/13 0903  BP: 140/82  Pulse: 70   Filed Weights   07/04/13 0903  Weight: 176 lb (79.833 kg)   Patient appears comfortable at rest. HEENT: Conjunctiva and lids normal, oropharynx clear. Neck: Supple, no elevated JVP or carotid bruits, no thyromegaly. Lungs: Clear to auscultation, nonlabored breathing at rest. Cardiac: Regular rate and rhythm, no S3 or significant systolic murmur, no pericardial rub. Abdomen: Soft, nontender, no hepatomegaly, bowel sounds present. Extremities: No  pitting edema, distal pulses 2+. Skin: Warm and dry. Musculoskeletal: No kyphosis. Neuropsychiatric: Alert and oriented x3, affect grossly appropriate.   Problem List and Plan   Atypical chest pain Symptoms outlined above, they have improved but persisted intermittently on PPI. Patient referred to arrange followup stress testing. Cardiac risk factors include type 2 diabetes mellitus, hypertension, and hyperlipidemia. Recent testing reviewed. Symptoms are unlikely to be ischemic in etiology based on description, however in light of risk factor profile, exercise echocardiogram will be obtained. If this is negative, she will keep followup with primary care provider, may need to consider GI consultation.  Essential hypertension, benign Blood pressure mildly elevated today. No change made to current regimen.  Mixed hyperlipidemia Managed with omega-3 supplements. Keep follow up with Dr. Dennard Schaumann.  Type 2 diabetes mellitus Diet controlled.    [name], M.D., F.A.C.C.

## 2013-07-04 NOTE — Assessment & Plan Note (Signed)
Symptoms outlined above, they have improved but persisted intermittently on PPI. Patient referred to arrange followup stress testing. Cardiac risk factors include type 2 diabetes mellitus, hypertension, and hyperlipidemia. Recent testing reviewed. Symptoms are unlikely to be ischemic in etiology based on description, however in light of risk factor profile, exercise echocardiogram will be obtained. If this is negative, she will keep followup with primary care provider, may need to consider GI consultation.

## 2013-07-04 NOTE — Addendum Note (Signed)
Addended by: Sharmon Revere on: 07/04/2013 11:56 AM   Modules accepted: Orders

## 2013-07-05 ENCOUNTER — Other Ambulatory Visit: Payer: Self-pay | Admitting: Physician Assistant

## 2013-07-05 LAB — LIPID PANEL
Cholesterol: 233 mg/dL — ABNORMAL HIGH (ref 0–200)
HDL: 38 mg/dL — ABNORMAL LOW (ref >39)
LDL Cholesterol: 150 mg/dL — ABNORMAL HIGH (ref 0–99)
Total CHOL/HDL Ratio: 6.1 Ratio
Triglycerides: 226 mg/dL — ABNORMAL HIGH (ref <150)
VLDL: 45 mg/dL — ABNORMAL HIGH (ref 0–40)

## 2013-07-05 LAB — HEPATIC FUNCTION PANEL
ALT: 12 U/L (ref 0–35)
AST: 12 U/L (ref 0–37)
Albumin: 3.5 g/dL (ref 3.5–5.2)
Alkaline Phosphatase: 97 U/L (ref 39–117)
Bilirubin, Direct: 0.1 mg/dL (ref 0.0–0.3)
Indirect Bilirubin: 0.3 mg/dL (ref 0.0–0.9)
Total Bilirubin: 0.4 mg/dL (ref 0.3–1.2)
Total Protein: 6.8 g/dL (ref 6.0–8.3)

## 2013-07-05 LAB — HEMOGLOBIN A1C
Hgb A1c MFr Bld: 6.8 % — ABNORMAL HIGH (ref <5.7)
Mean Plasma Glucose: 148 mg/dL — ABNORMAL HIGH (ref <117)

## 2013-07-11 ENCOUNTER — Encounter: Payer: Self-pay | Admitting: Family Medicine

## 2013-07-21 ENCOUNTER — Encounter: Payer: Self-pay | Admitting: *Deleted

## 2013-07-21 ENCOUNTER — Encounter: Payer: Self-pay | Admitting: Family Medicine

## 2013-07-21 ENCOUNTER — Ambulatory Visit: Admitting: Family Medicine

## 2013-07-21 ENCOUNTER — Telehealth: Payer: Self-pay | Admitting: Family Medicine

## 2013-07-21 ENCOUNTER — Other Ambulatory Visit: Payer: Self-pay | Admitting: Family Medicine

## 2013-07-21 ENCOUNTER — Encounter (HOSPITAL_COMMUNITY): Payer: Self-pay

## 2013-07-21 ENCOUNTER — Ambulatory Visit (HOSPITAL_COMMUNITY)
Admission: RE | Admit: 2013-07-21 | Discharge: 2013-07-21 | Disposition: A | Discharge status: Home / Self Care | Source: Ambulatory Visit | Attending: Cardiology | Admitting: Cardiology

## 2013-07-21 ENCOUNTER — Ambulatory Visit (INDEPENDENT_AMBULATORY_CARE_PROVIDER_SITE_OTHER): Admitting: Family Medicine

## 2013-07-21 VITALS — BP 142/98 | HR 78 | Temp 97.5°F | Resp 16 | Wt 176.0 lb

## 2013-07-21 DIAGNOSIS — E119 Type 2 diabetes mellitus without complications: Secondary | ICD-10-CM

## 2013-07-21 DIAGNOSIS — E669 Obesity, unspecified: Secondary | ICD-10-CM | POA: Insufficient documentation

## 2013-07-21 DIAGNOSIS — K219 Gastro-esophageal reflux disease without esophagitis: Secondary | ICD-10-CM | POA: Insufficient documentation

## 2013-07-21 DIAGNOSIS — R072 Precordial pain: Secondary | ICD-10-CM

## 2013-07-21 DIAGNOSIS — R079 Chest pain, unspecified: Secondary | ICD-10-CM

## 2013-07-21 DIAGNOSIS — R0789 Other chest pain: Secondary | ICD-10-CM

## 2013-07-21 DIAGNOSIS — E785 Hyperlipidemia, unspecified: Secondary | ICD-10-CM | POA: Insufficient documentation

## 2013-07-21 DIAGNOSIS — R1013 Epigastric pain: Secondary | ICD-10-CM

## 2013-07-21 DIAGNOSIS — I10 Essential (primary) hypertension: Secondary | ICD-10-CM | POA: Insufficient documentation

## 2013-07-21 MED ORDER — ATORVASTATIN CALCIUM 20 MG PO TABS: 20.0000 mg | ORAL_TABLET | Freq: Every day | ORAL | Status: DC

## 2013-07-21 MED ORDER — FLUCONAZOLE 150 MG PO TABS: 150.0000 mg | ORAL_TABLET | Freq: Once | ORAL | Status: DC

## 2013-07-21 MED ORDER — HYOSCYAMINE SULFATE 0.125 MG/ML PO SOLN: 0.1250 mg | ORAL | Status: DC | PRN

## 2013-07-21 NOTE — Progress Notes (Signed)
*  PRELIMINARY RESULTS* Echocardiogram Echocardiogram Stress Test has been performed.  [place], [place] 07/21/2013, 11:07 AM

## 2013-07-21 NOTE — Progress Notes (Signed)
Stress Lab Nurses Notes - [name]  AUGUSTINE STILSON 07/21/2013 Reason for doing test: Chest Pain Type of test: Stress Echo Nurse performing test: Gerrit Halls, RN Nuclear Medicine Tech: Not Applicable Echo Tech: Not Applicable MD performing test: [place]/K.Purcell Nails NP Family MD: [place] Test explained and consent signed: yes IV started: No IV started Symptoms: fatigue Treatment/Intervention: None Reason test stopped: fatigue After recovery IV was: NA Patient to return to [place]. Med at : NA Patient discharged: Home Patient's Condition upon discharge was: stable Comments: During test peak BP 186/77 & HR 127.  Recovery BP 133/74 & HR 80.  Symptoms resolved in recovery. [name] T

## 2013-07-21 NOTE — Telephone Encounter (Signed)
I will escribe diflucan.

## 2013-07-21 NOTE — Telephone Encounter (Signed)
[facility] [place] Pt was seen earlier and she forgot to ask for something for her Yeast infection and she would like to have something for that Call back number is [phone]

## 2013-07-21 NOTE — Progress Notes (Signed)
Subjective:    Patient ID: Megan [name], female    DOB: [birth year], 52 y.o.   MRN: [id number]  HPI Please see most recent office visit. The patient has been taking protonic twice a day as well as sucralfate. The chest pain has not improved. Furthermore she has developed crampy epigastric abdominal pain. She denies any melena or hematochezia. She denies any nausea or vomiting. She denies any constipation. There no exacerbating or alleviating factors. Chest history of a cholecystectomy. She denies any history of pancreatitis.. Most recent lab work is listed below: Orders Only on 07/05/2013  Component Date Value Range Status  . Total Bilirubin 07/05/2013 0.4  0.3 - 1.2 mg/dL Final  . Bilirubin, Direct 07/05/2013 0.1  0.0 - 0.3 mg/dL Final  . Indirect Bilirubin 07/05/2013 0.3  0.0 - 0.9 mg/dL Final  . Alkaline Phosphatase 07/05/2013 97  39 - 117 U/L Final  . AST 07/05/2013 12  0 - 37 U/L Final  . ALT 07/05/2013 12  0 - 35 U/L Final  . Total Protein 07/05/2013 6.8  6.0 - 8.3 g/dL Final  . Albumin 07/05/2013 3.5  3.5 - 5.2 g/dL Final  Orders Only on 07/05/2013  Component Date Value Range Status  . Cholesterol 07/05/2013 233* 0 - 200 mg/dL Final   Comment: ATP III Classification:                                < 200        mg/dL        Desirable                               200 - 239     mg/dL        Borderline High                               >= 240        mg/dL        High                             . Triglycerides 07/05/2013 226* <150 mg/dL Final  . HDL 07/05/2013 38* >39 mg/dL Final  . Total CHOL/HDL Ratio 07/05/2013 6.1   Final  . VLDL 07/05/2013 45* 0 - 40 mg/dL Final  . LDL Cholesterol 07/05/2013 150* 0 - 99 mg/dL Final   Comment:                            Total Cholesterol/HDL Ratio:CHD Risk                                                 Coronary Heart Disease Risk Table                                                                 Men  Women                   1/2 Average Risk              3.4        3.3                                       Average Risk              5.0        4.4                                    2X Average Risk              9.6        7.1                                    3X Average Risk             23.4       11.0                          Use the calculated Patient Ratio above and the CHD Risk table                           to determine the patient's CHD Risk.                          ATP III Classification (LDL):                                < 100        mg/dL         Optimal                               100 - 129     mg/dL         Near or Above Optimal                               130 - 159     mg/dL         Borderline High                               160 - 189     mg/dL         High                                > 190        mg/dL         Very High                             . Hemoglobin A1C 07/05/2013 6.8* <  5.7 % Final   Comment:                                                                                                 According to the [name] Clinical Practice Recommendations for 2011, when                          HbA1c is used as a screening test:                                                       >=6.5%   Diagnostic of Diabetes Mellitus                                     (if abnormal result is confirmed)                                                     5.7-6.4%   Increased risk of developing Diabetes Mellitus                                                     References:Diagnosis and Classification of Diabetes Mellitus,Diabetes                          [id number] 1):S62-S69 and Standards of Medical Care in                                  Diabetes - 2011,Diabetes [id number] (Suppl 1):S11-S61.                             . Mean Plasma Glucose 07/05/2013 148* <117 mg/dL Final  Office Visit on 05/22/2013  Component Date Value Range Status  . Cholesterol 05/22/2013 254* 0  - 200 mg/dL Final   Comment: ATP III Classification:                                < 200        mg/dL        Desirable                               200 - 239     mg/dL        Borderline High                               >=  240        mg/dL        High                             . Triglycerides 05/22/2013 136  <150 mg/dL Final  . HDL 05/22/2013 42  >39 mg/dL Final  . Total CHOL/HDL Ratio 05/22/2013 6.0   Final  . VLDL 05/22/2013 27  0 - 40 mg/dL Final  . LDL Cholesterol 05/22/2013 185* 0 - 99 mg/dL Final   Comment:                            Total Cholesterol/HDL Ratio:CHD Risk                                                 Coronary Heart Disease Risk Table                                                                 Men       Women                                   1/2 Average Risk              3.4        3.3                                       Average Risk              5.0        4.4                                    2X Average Risk              9.6        7.1                                    3X Average Risk             23.4       11.0                          Use the calculated Patient Ratio above and the CHD Risk table                           to determine the patient's CHD Risk.                          ATP III Classification (LDL):                                <  100        mg/dL         Optimal                               100 - 129     mg/dL         Near or Above Optimal                               130 - 159     mg/dL         Borderline High                               160 - 189     mg/dL         High                                > 190        mg/dL         Very High                             . Total Bilirubin 05/22/2013 0.4  0.3 - 1.2 mg/dL Final  . Bilirubin, Direct 05/22/2013 0.1  0.0 - 0.3 mg/dL Final  . Indirect Bilirubin 05/22/2013 0.3  0.0 - 0.9 mg/dL Final  . Alkaline Phosphatase 05/22/2013 101  39 - 117 U/L Final  . AST 05/22/2013 13  0 - 37 U/L  Final  . ALT 05/22/2013 13  0 - 35 U/L Final  . Total Protein 05/22/2013 6.8  6.0 - 8.3 g/dL Final  . Albumin 05/22/2013 3.7  3.5 - 5.2 g/dL Final  . Hemoglobin A1C 05/22/2013 6.6* <5.7 % Final   Comment:                                                                                                 According to the [name] Clinical Practice Recommendations for 2011, when                          HbA1c is used as a screening test:                                                       >=6.5%   Diagnostic of Diabetes Mellitus                                     (if abnormal result is confirmed)  5.7-6.4%   Increased risk of developing Diabetes Mellitus                                                     References:Diagnosis and Classification of Diabetes Mellitus,Diabetes                          [id number] 1):S62-S69 and Standards of Medical Care in                                  Diabetes - 2011,Diabetes [id number] (Suppl 1):S11-S61.                             . Mean Plasma Glucose 05/22/2013 143* <117 mg/dL Final   was found to have an elevated LDL at 150 and hemoglobin A1c was elevated at 6.8. She's here today to discuss medical therapy for these options. Past Medical History  Diagnosis Date  . Mixed hyperlipidemia   . Essential hypertension, benign   . Hemorrhoids   . S/P colonoscopy Jan 2011    Dr. Benson Norway: sessile polyp (benign lymphoid), large hemorrhoids, repeat 5-10 years  . CKD (chronic kidney disease) stage 3, GFR 30-59 ml/min   . GERD (gastroesophageal reflux disease)   . Type 2 diabetes mellitus    Past Surgical History  Procedure Laterality Date  . Abdominal hysterectomy    . Esophagogastroduodenoscopy  09/05/2011    Procedure: ESOPHAGOGASTRODUODENOSCOPY (EGD);  Surgeon: Daneil Dolin, MD;  Location[facility];  Service: Endoscopy;  Laterality: N/A;  1:30  . Cholecystectomy  09/29/2011    Procedure:  LAPAROSCOPIC CHOLECYSTECTOMY;  Surgeon: Jamesetta So, MD;  Location: [facility];  Service: General;  Laterality: N/A;  . Laparoscopic appendectomy  09/29/2011    Procedure: APPENDECTOMY LAPAROSCOPIC;  Surgeon: Jamesetta So, MD;  Location: [facility];  Service: General;;  incidental appendectomy   Current Outpatient Prescriptions on File Prior to Visit  Medication Sig Dispense Refill  . amLODipine (NORVASC) 10 MG tablet Take 10 mg by mouth daily.      Marland Kitchen aspirin EC 81 MG tablet Take 81 mg by mouth daily.        . cloNIDine (CATAPRES) 0.2 MG tablet Take 1 tablet (0.2 mg total) by mouth 2 (two) times daily.  60 tablet  11  . losartan (COZAAR) 100 MG tablet Take 1 tablet (100 mg total) by mouth daily.  90 tablet  4  . Nebivolol HCl 20 MG TABS Take 1 tablet (20 mg total) by mouth daily.  30 tablet  3  . Omega-3 Fatty Acids (FISH OIL) 1000 MG CAPS Take by mouth.      . pantoprazole (PROTONIX) 40 MG tablet Take 1 tablet (40 mg total) by mouth 2 (two) times daily.  60 tablet  1  . sucralfate (CARAFATE) 1 G tablet Take 1 tablet (1 g total) by mouth 4 (four) times daily.  120 tablet  1   No current facility-administered medications on file prior to visit.   No Known Allergies History   Social History  . Marital Status: Married    Spouse Name: N/A    Number of Children: N/A  . Years of Education: N/A  Occupational History  . Food [employer]    Social History Main Topics  . Smoking status: Never Smoker   . Smokeless tobacco: Not on file  . Alcohol Use: No  . Drug Use: No  . Sexual Activity: Yes   Other Topics Concern  . Not on file   Social History Narrative   Works at [employer] in [place].    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   Also has to get items from high shelves-causes achy pain in shoulder area      Married.   Children are grown, out of house.       Review of Systems  All other systems reviewed and are negative.       Objective:   Physical Exam  Vitals  reviewed. Constitutional: She appears well-developed and well-nourished.  Neck: Neck supple. No JVD present. No thyromegaly present.  Cardiovascular: Normal rate, regular rhythm and normal heart sounds.  Exam reveals no gallop and no friction rub.   No murmur heard. Pulmonary/Chest: Effort normal and breath sounds normal. No respiratory distress. She has no wheezes. She has no rales. She exhibits no tenderness.  Abdominal: Soft. Bowel sounds are normal. She exhibits no distension and no mass. There is no tenderness. There is no rebound and no guarding.  Lymphadenopathy:    She has no cervical adenopathy.          Assessment & Plan:  1. Chest pain, unspecified Patient has seen cardiology. She had a stress test this morning. I do not have the results of this. Apparently she continues to have chest pain and epigastric abdominal pain. I have treated her empirically for peptic ulcer disease without any improvement. Therefore we'll consult GI for possible EGD. In the meantime also patient on Levsin 125 mcg by mouth q. 4 hours when necessary abdominal cramping.  2. Other and unspecified hyperlipidemia Go LDL is less than 100. Begin Lipitor 20 mg by mouth  3. Type II or unspecified type diabetes mellitus without mention of complication, not stated as uncontrolled I recommended a low carbohydrate diet, increasing aerobic exercise, and 10-15 pounds weight loss. Recheck fasting lipid panel and hemoglobin A1c in 3 months. Also consult a diabetic nutritionist for the patient.

## 2013-07-22 ENCOUNTER — Telehealth (HOSPITAL_COMMUNITY): Payer: Self-pay | Admitting: Dietician

## 2013-07-22 ENCOUNTER — Encounter: Payer: Self-pay | Admitting: Internal Medicine

## 2013-07-22 NOTE — Telephone Encounter (Signed)
Patient aware.

## 2013-07-22 NOTE — Telephone Encounter (Signed)
Received referral from [facility] (Dr. Dennard Schaumann) for dx: diabetes.

## 2013-07-22 NOTE — Telephone Encounter (Signed)
Called at 1216. Appointment scheduled for 07/24/13 at 1600.

## 2013-07-24 ENCOUNTER — Encounter (HOSPITAL_COMMUNITY): Payer: Self-pay | Admitting: Dietician

## 2013-07-24 NOTE — Progress Notes (Signed)
Outpatient Initial Nutrition Assessment  Date:07/24/2013   Appt Start Time: [id number]  Referring Physician: Dr. Dennard Schaumann [facility]) Reason for Visit: diabetes  Nutrition Assessment:  Height: 5' (152.4 cm)   Weight: 174 lb (78.926 kg)   IBW: 100#  %IBW: 174% UBW: 175%  %UBW: 100% Body mass index is 33.98 kg/(m^2). Meets criteria for obesity, class I.  Goal Weight: 156# (10% loss of current wt) Weight hx: Wt Readings from Last 10 Encounters:  07/21/13 176 lb (79.833 kg)  07/04/13 176 lb (79.833 kg)  06/16/13 174 lb (78.926 kg)  05/22/13 175 lb (79.379 kg)  05/14/13 170 lb (77.111 kg)  02/19/13 173 lb (78.472 kg)  02/05/13 172 lb (78.019 kg)  02/03/12 160 lb (72.576 kg)  09/28/11 166 lb (75.297 kg)  08/30/11 166 lb 12.8 oz (75.66 kg)   Noted progressive weight gain. She reports UBW of 175#.  Estimated nutritional needs:  Kcals/ day: 1300-1400 Protein (grams)/day: 63-79 Fluid (L)/ day: 1.3-1.4  PMH:  Past Medical History  Diagnosis Date  . Mixed hyperlipidemia   . Essential hypertension, benign   . Hemorrhoids   . S/P colonoscopy Jan 2011    Dr. Benson Norway: sessile polyp (benign lymphoid), large hemorrhoids, repeat 5-10 years  . CKD (chronic kidney disease) stage 3, GFR 30-59 ml/min   . GERD (gastroesophageal reflux disease)   . Type 2 diabetes mellitus     Medications:  Current Outpatient Rx  Name  Route  Sig  Dispense  Refill  . amLODipine (NORVASC) 10 MG tablet   Oral   Take 10 mg by mouth daily.         Marland Kitchen aspirin EC 81 MG tablet   Oral   Take 81 mg by mouth daily.           Marland Kitchen atorvastatin (LIPITOR) 20 MG tablet   Oral   Take 1 tablet (20 mg total) by mouth daily.   30 tablet   5   . cloNIDine (CATAPRES) 0.2 MG tablet   Oral   Take 1 tablet (0.2 mg total) by mouth 2 (two) times daily.   60 tablet   11   . fluconazole (DIFLUCAN) 150 MG tablet   Oral   Take 1 tablet (150 mg total) by mouth once.   1 tablet   0   . hyoscyamine (LEVSIN) 0.125 MG/ML solution   Oral   Take 1 mL (0.125 mg total) by mouth every 4 (four) hours as needed.   15 mL   0   . losartan (COZAAR) 100 MG tablet   Oral   Take 1 tablet (100 mg total) by mouth daily.   90 tablet   4   . Nebivolol HCl 20 MG TABS   Oral   Take 1 tablet (20 mg total) by mouth daily.   30 tablet   3   . Omega-3 Fatty Acids (FISH OIL) 1000 MG CAPS   Oral   Take by mouth.         . pantoprazole (PROTONIX) 40 MG tablet   Oral   Take 1 tablet (40 mg total) by mouth 2 (two) times daily.   60 tablet   1   . sucralfate (CARAFATE) 1 G tablet   Oral   Take 1 tablet (1 g total) by mouth 4 (four) times daily.   120 tablet   1     Labs: CMP     Component Value Date/Time   NA 140 05/14/2013 0959   K 3.8 05/14/2013  0959   CL 106 05/14/2013 0959   CO2 28 05/14/2013 0959   GLUCOSE 128* 05/14/2013 0959   BUN 18 05/14/2013 0959   CREATININE 1.62* 05/14/2013 0959   CREATININE 1.58* 02/05/2013 1254   CALCIUM 9.2 05/14/2013 0959   PROT 6.8 07/05/2013 0855   ALBUMIN 3.5 07/05/2013 0855   AST 12 07/05/2013 0855   ALT 12 07/05/2013 0855   ALKPHOS 97 07/05/2013 0855   BILITOT 0.4 07/05/2013 0855   GFRNONAA 36* 05/14/2013 0959   GFRAA 41* 05/14/2013 0959    Lipid Panel     Component Value Date/Time   CHOL 233* 07/05/2013 0855   TRIG 226* 07/05/2013 0855   HDL 38* 07/05/2013 0855   CHOLHDL 6.1 07/05/2013 0855   VLDL 45* 07/05/2013 0855   LDLCALC 150* 07/05/2013 0855     Lab Results  Component Value Date   HGBA1C 6.8* 07/05/2013   HGBA1C 6.6* 05/22/2013   Lab Results  Component Value Date   LDLCALC 150* 07/05/2013   CREATININE 1.62* 05/14/2013     Lifestyle/ social habits: Ms. Tritle resides in [place] with her husband, who is present with her today. She also has a 48 year old son who resides with them. 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 She reports her stress level as a 6 out of 10, citing work as her main sources of stress She does not participate in physical  activity. She reports she exercised regularly 1 year ago with her sister, going to the [facility] and using various machines, but both stopped.   Nutrition hx/habits: Ms. Sullender reports extreme changes in her diet over the past few weeks. She reports refraining from sodas and drinking water instead. She reports she has had 1 Pepsi and 1/2 glass sweet tea in the past 2 weeks. She was previously consuming 3-4 bottles of Pepsi daily. She also is consuming baked proteins and has avoided chips and cookies in the past two weeks. She reports she was just diagnosed with diabetes, but had prediabetes for several years prior to diagnosis. She reports that she would like to work on lifestyle modifications for optimal glycemic control. She reports Dr. Samella Parr goal Hgb A1c for her was 6.0 and she does not have to self-monitor at this time. She is currently on no medications for diabetes.  She reports that she used to eat 1 meal per day, but has since started eating breakfast. She still does not eat lunch, but is working on this.   Diet recall: Breakfast: bowl of cheerios; Lunch: skip; Dinner: grilled salmon, green beans, baked sweet potato  Nutrition Diagnosis: Nutrition knowledge deficit r/t newly diagnosed diabetes AEb Hgb A1c: 6.8.  Nutrition Intervention: Nutrition rx: 1400 kcal NAS, diabetic diet; 3 meals (45-60 grams of carbs per meal) per day, 4-5 hours apart; limit 1 starch per meal; limit snacks (15-20 grams carbs per snack); low calorie beverages only; 2.5 hours physical activity per week  Education/Counseling Provided: Educated pt on principles of diabetic diet. Discussed carbohydrate metabolism in relation to diabetes. Educated pt on basic self-management principles including: signs and symptoms of hyperglycemia and hypoglycemia, goals for fasting and postprandial blood sugars, goals for Hgb A1c, importance of checking feet, importance of keeping PCP appointments, and foot care. Educated pt on plate  method, portion sizes, and sources of carbohydrate. Discussed importance of regular meal pattern. Discussed importance of adding sources of whole grains to diet to improve glycemic control. Also encouraged to choose low fat dairy, lean meats,  and whole fruits and vegetables more often. Educated on food label reading. Discussed options of artificial sweeteners and encouraged pt to use which brand she liked best. Discussed nutritional content of foods commonly eaten and discussed healthier alternatives. Discussed importance of compliance to prevent further complications of disease. Educated pt on importance of physical activity (goal of at least 30 minutes 5 times per week) along with a healthy diet to achieve weight loss and glycemic goals. Encouraged slow, moderate weight loss of 1-2# per week, or 7-10% of current body weight. Provided "Carbohydrate and Meal Planning", "Diabetes and You" and "Your Guide To Better Office Visits" handouts. Used TeachBack to assess understanding.   Understanding, Motivation, Ability to Follow Recommendations: Expect good compliance.  Monitoring and Evaluation: Goals: 1) 2.5 hours physical activity per week; 2) 3 meals per day; 3) 0.5-2# wt loss per week; 4) Hgb A1c < 6.8  Recommendations: 1) 3 meals per day, 4-5 hours apart, around the same time each day; 2) Break physical activity up into smaller, more frequent sessions  F/U: PRN. Provided RD contact information.   Marisela Line A. Jimmye Norman, RD, LDN 07/24/2013  Appt EndTime: [id number]

## 2013-07-28 ENCOUNTER — Other Ambulatory Visit: Payer: Self-pay | Admitting: Physician Assistant

## 2013-08-01 ENCOUNTER — Ambulatory Visit (HOSPITAL_COMMUNITY): Admission: RE | Admit: 2013-08-01 | Source: Ambulatory Visit

## 2013-08-13 ENCOUNTER — Ambulatory Visit (INDEPENDENT_AMBULATORY_CARE_PROVIDER_SITE_OTHER): Admitting: Gastroenterology

## 2013-08-13 ENCOUNTER — Encounter: Payer: Self-pay | Admitting: Gastroenterology

## 2013-08-13 VITALS — BP 143/90 | HR 72 | Temp 97.3°F | Wt 173.4 lb

## 2013-08-13 DIAGNOSIS — R1013 Epigastric pain: Secondary | ICD-10-CM

## 2013-08-13 MED ORDER — DEXLANSOPRAZOLE 60 MG PO CPDR: 60.0000 mg | DELAYED_RELEASE_CAPSULE | Freq: Every day | ORAL | Status: DC

## 2013-08-13 NOTE — Assessment & Plan Note (Signed)
52 year old with recurrent dyspepsia, worsened by eating. However, no weight loss, nausea, vomiting, melena, or dysphagia noted. Similar to symptoms prior to cholecystectomy. EGD fairly recent in Jan 2013; CT also performed around that time. Only change in medication includes addition of fish oil. She has tried and failed Protonix and Prilosec. Labs unrevealing thus far. Unclear etiology at this point. Could be dealing with gastritis, non-ulcer dyspepsia, doubt pancreatitis or occult malignancy. I have asked her to stop taking fish oil for now, stop Protonix, and start Dexilant once daily. She is to call me next Tuesday with an update. Depending on her symptoms, we will proceed with either an EGD or CT abd/pelvis. She is not acutely ill and in good spirits at time of visit.

## 2013-08-13 NOTE — Progress Notes (Signed)
Referring Provider: [name], MD Primary Care Physician:  Odette Fraction, MD Primary GI: Dr. [name]   Chief Complaint  Patient presents with  . Abdominal Pain    HPI:   Megan [name] presents today at the request of Dr. Dennard Schaumann secondary to epigastric pain. States sharp pain in epigastric area started on Dec 8th, acute onset. Radiated to back and right shoulder blade. Hurts daily. Worsened by eating and drinking. Had Glucerna and bottled water early this morning. No significant weight loss. Trying to eat right due to diagnosis of diabetes. No N/V. No NSAIDs, aspirin powders. No melena. No hematochezia.  Last EGD Jan 2013. Overall normal. Subsequent cholecystectomy done that year due to biliary dyskinesia. Occasional indigestion. Started Protonix BID in Nov 2014. Prior to this was not on any PPI. Pain is constant but waxes and wanes. Pain usually about a 4 but worsens to greater than 10. No bloating. Denies daily diarrhea. No constipation. Started taking fish oil before Thanksgiving. No improvement with Levsin.   Tried Protonix and Prilosec in past.    Past Medical History  Diagnosis Date  . Mixed hyperlipidemia   . Essential hypertension, benign   . Hemorrhoids   . S/P colonoscopy Jan 2011    Dr. Benson Norway: sessile polyp (benign lymphoid), large hemorrhoids, repeat 5-10 years  . CKD (chronic kidney disease) stage 3, GFR 30-59 ml/min   . GERD (gastroesophageal reflux disease)   . Type 2 diabetes mellitus     Past Surgical History  Procedure Laterality Date  . Abdominal hysterectomy    . Esophagogastroduodenoscopy  09/05/2011    [id number] hiatal hernia; remainder of exam normal. No explanation for patient's abdominal pain with today's examination  . Cholecystectomy  09/29/2011    Procedure: LAPAROSCOPIC CHOLECYSTECTOMY;  Surgeon: Jamesetta So, MD;  Location: [facility];  Service: General;  Laterality: N/A;  . Laparoscopic appendectomy  09/29/2011    Procedure:  APPENDECTOMY LAPAROSCOPIC;  Surgeon: Jamesetta So, MD;  Location: [facility];  Service: General;;  incidental appendectomy  . Colonoscopy  Jan 2011    Dr. Benson Norway: sessile polyp (benign lymphoid), large hemorrhoids, repeat 5-10 years    Current Outpatient Prescriptions  Medication Sig Dispense Refill  . amLODipine (NORVASC) 10 MG tablet Take 10 mg by mouth daily.      Marland Kitchen aspirin EC 81 MG tablet Take 81 mg by mouth daily.        Marland Kitchen atorvastatin (LIPITOR) 20 MG tablet Take 1 tablet (20 mg total) by mouth daily.  30 tablet  5  . BYSTOLIC 20 MG TABS TAKE 1 TABLET (20 MG TOTAL) BY MOUTH DAILY.  30 tablet  3  . cloNIDine (CATAPRES) 0.2 MG tablet Take 1 tablet (0.2 mg total) by mouth 2 (two) times daily.  60 tablet  11  . hyoscyamine (LEVSIN) 0.125 MG/ML solution Take 1 mL (0.125 mg total) by mouth every 4 (four) hours as needed.  15 mL  0  . losartan (COZAAR) 100 MG tablet Take 1 tablet (100 mg total) by mouth daily.  90 tablet  4  . Omega-3 Fatty Acids (FISH OIL) 1000 MG CAPS Take by mouth.      . pantoprazole (PROTONIX) 40 MG tablet Take 1 tablet (40 mg total) by mouth 2 (two) times daily.  60 tablet  1  . fluconazole (DIFLUCAN) 150 MG tablet Take 1 tablet (150 mg total) by mouth once.  1 tablet  0  . sucralfate (CARAFATE) 1 G tablet Take 1  tablet (1 g total) by mouth 4 (four) times daily.  120 tablet  1   No current facility-administered medications for this visit.    Allergies as of 08/13/2013  . (No Known Allergies)    Family History  Problem Relation Age of Onset  . Colon cancer Neg Hx   . Hypertension Mother   . Coronary artery disease Mother   . Diabetes Mother   . Hypertension Sister   . Coronary artery disease Sister   . Hypertension Brother     History   Social History  . Marital Status: Married    Spouse Name: N/A    Number of Children: N/A  . 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 Smoking status: Never Smoker   .  Smokeless tobacco: None  . Alcohol Use: No  . Drug Use: No  . Sexual Activity: Yes   Other Topics Concern  . None   Social History Narrative   Works at [employer] in [place].    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   Also has to get items from high shelves-causes achy pain in shoulder area      Married.   Children are grown, out of house.    Review of Systems: As mentioned in HPI.   Physical Exam: BP 143/90  Pulse 72  Temp(Src) 97.3 F (36.3 C) (Oral)  Wt 173 lb 6.4 oz (78.654 kg) General:   Alert and oriented. No distress noted. Pleasant and cooperative.  Head:  Normocephalic and atraumatic. Eyes:  Conjuctiva clear without scleral icterus. Mouth:  Oral mucosa pink and moist. Good dentition. No lesions. Heart:  S1, S2 present without murmurs, rubs, or gallops. Regular rate and rhythm. Abdomen:  +BS, soft, non-tender and non-distended. No rebound or guarding. No HSM or masses noted. Msk:  Symmetrical without gross deformities. Normal posture. Extremities:  Without edema. Neurologic:  Alert and  oriented x4;  grossly normal neurologically. Skin:  Intact without significant lesions or rashes. Cervical Nodes:  No significant cervical adenopathy. Psych:  Alert and cooperative. Normal mood and affect.  Lab Results  Component Value Date   WBC 5.9 05/14/2013   HGB 13.4 05/14/2013   HCT 39.9 05/14/2013   MCV 86.4 05/14/2013   PLT 292 05/14/2013   Lab Results  Component Value Date   ALT 12 07/05/2013   AST 12 07/05/2013   ALKPHOS 97 07/05/2013   BILITOT 0.4 07/05/2013

## 2013-08-13 NOTE — Patient Instructions (Signed)
Stop fish oil for now.  Stop Protonix.  Start taking Dexilant 1 capsule each morning. This is for reflux.  Please call me next Tuesday, January 6th. I would like to see how you are doing. If no improvement, we may need to do either another upper endoscopy or CT scan.   Have a good New Year!

## 2013-08-15 NOTE — Progress Notes (Signed)
cc'd to pcp 

## 2013-08-18 ENCOUNTER — Other Ambulatory Visit: Payer: Self-pay | Admitting: Family Medicine

## 2013-08-18 DIAGNOSIS — Z139 Encounter for screening, unspecified: Secondary | ICD-10-CM

## 2013-08-19 ENCOUNTER — Ambulatory Visit (HOSPITAL_COMMUNITY)

## 2013-08-19 ENCOUNTER — Ambulatory Visit (HOSPITAL_COMMUNITY)
Admission: RE | Admit: 2013-08-19 | Discharge: 2013-08-19 | Disposition: A | Discharge status: Home / Self Care | Source: Ambulatory Visit | Attending: Cardiology | Admitting: Cardiology

## 2013-08-19 ENCOUNTER — Encounter (HOSPITAL_COMMUNITY): Payer: Self-pay

## 2013-08-19 DIAGNOSIS — R072 Precordial pain: Secondary | ICD-10-CM

## 2013-08-19 NOTE — Progress Notes (Signed)
*  PRELIMINARY RESULTS* Echocardiogram Echocardiogram Stress Test has been performed.This study performed at no charge to the patient per Dr Domenic Polite. Previous stress echo test performed 07/2013 non diagnostic due to patient taking a beta blocker.   [place], [place] 08/19/2013, 12:27 PM

## 2013-08-19 NOTE — Progress Notes (Signed)
Stress Lab Nurses Notes - [name]  Megan [name] 08/19/2013 Reason for doing test: Chest Pain Type of test: Stress Echo Nurse performing test: Gerrit Halls, RN Nuclear Medicine Tech: Not Applicable Echo Tech: Jamison Neighbor MD performing test: Dr. Lynann Beaver.Lawrence NP Family MD: Dr. Dennard Schaumann Test explained and consent signed: yes IV started: No IV started Symptoms: none  Treatment/Intervention: None Reason test stopped: fatigue and reached target HR After recovery IV was: NA Patient to return to [facility]. Med at :NA Patient discharged: Home Patient's Condition upon discharge was: stable Comments: Stress Echo was repeated today due to not reaching THR on 07-21-13. (beta blocker was held today)  During test peak BP 180/85 & HR 164 .  Recovery BP 118/83 & HR 80.  Symptoms resolved in recovery. [name] T

## 2013-08-22 ENCOUNTER — Ambulatory Visit (INDEPENDENT_AMBULATORY_CARE_PROVIDER_SITE_OTHER): Admitting: Family Medicine

## 2013-08-22 ENCOUNTER — Encounter: Payer: Self-pay | Admitting: Family Medicine

## 2013-08-22 VITALS — BP 130/84 | HR 64 | Temp 97.3°F | Resp 16 | Ht 62.0 in | Wt 173.0 lb

## 2013-08-22 DIAGNOSIS — K3189 Other diseases of stomach and duodenum: Secondary | ICD-10-CM

## 2013-08-22 DIAGNOSIS — K319 Disease of stomach and duodenum, unspecified: Secondary | ICD-10-CM

## 2013-08-22 DIAGNOSIS — R1013 Epigastric pain: Principal | ICD-10-CM

## 2013-08-22 NOTE — Progress Notes (Signed)
Subjective:    Patient ID: Megan [name], female    DOB: 08-03-1961, 53 y.o.   MRN: [id number]  HPI 07/21/13 Please see most recent office visit. The patient has been taking protonic twice a day as well as sucralfate. The chest pain has not improved. Furthermore she has developed crampy epigastric abdominal pain. She denies any melena or hematochezia. She denies any nausea or vomiting. She denies any constipation. There no exacerbating or alleviating factors. Chest history of a cholecystectomy. She denies any history of pancreatitis.. Most recent lab work is listed below: Orders Only on 07/05/2013  Component Date Value Range Status  . Total Bilirubin 07/05/2013 0.4  0.3 - 1.2 mg/dL Final  . Bilirubin, Direct 07/05/2013 0.1  0.0 - 0.3 mg/dL Final  . Indirect Bilirubin 07/05/2013 0.3  0.0 - 0.9 mg/dL Final  . Alkaline Phosphatase 07/05/2013 97  39 - 117 U/L Final  . AST 07/05/2013 12  0 - 37 U/L Final  . ALT 07/05/2013 12  0 - 35 U/L Final  . Total Protein 07/05/2013 6.8  6.0 - 8.3 g/dL Final  . Albumin 07/05/2013 3.5  3.5 - 5.2 g/dL Final  Orders Only on 07/05/2013  Component Date Value Range Status  . Cholesterol 07/05/2013 233* 0 - 200 mg/dL Final   Comment: ATP III Classification:                                < 200        mg/dL        Desirable                               200 - 239     mg/dL        Borderline High                               >= 240        mg/dL        High                             . Triglycerides 07/05/2013 226* <150 mg/dL Final  . HDL 07/05/2013 38* >39 mg/dL Final  . Total CHOL/HDL Ratio 07/05/2013 6.1   Final  . VLDL 07/05/2013 45* 0 - 40 mg/dL Final  . LDL Cholesterol 07/05/2013 150* 0 - 99 mg/dL Final   Comment:                            Total Cholesterol/HDL Ratio:CHD Risk                                                 Coronary Heart Disease Risk Table                                                                 Men  Women                            1/2 Average Risk              3.4        3.3                                       Average Risk              5.0        4.4                                    2X Average Risk              9.6        7.1                                    3X Average Risk             23.4       11.0                          Use the calculated Patient Ratio above and the CHD Risk table                           to determine the patient's CHD Risk.                          ATP III Classification (LDL):                                < 100        mg/dL         Optimal                               100 - 129     mg/dL         Near or Above Optimal                               130 - 159     mg/dL         Borderline High                               160 - 189     mg/dL         High                                > 190        mg/dL         Very High                             .  Hemoglobin A1C 07/05/2013 6.8* <5.7 % Final   Comment:                                                                                                 According to the [name] Clinical Practice Recommendations for 2011, when                          HbA1c is used as a screening test:                                                       >=6.5%   Diagnostic of Diabetes Mellitus                                     (if abnormal result is confirmed)                                                     5.7-6.4%   Increased risk of developing Diabetes Mellitus                                                     References:Diagnosis and Classification of Diabetes Mellitus,Diabetes                          [id number] 1):S62-S69 and Standards of Medical Care in                                  Diabetes - 2011,Diabetes [id number] (Suppl 1):S11-S61.                             . Mean Plasma Glucose 07/05/2013 148* <117 mg/dL Final   was found to have an elevated LDL at 150 and hemoglobin A1c was elevated at 6.8. She's  here today to discuss medical therapy for these options.  At that time, my plan was: 1. Chest pain, unspecified Patient has seen cardiology. She had a stress test this morning. I do not have the results of this. Apparently she continues to have chest pain and epigastric abdominal pain. I have treated her empirically for peptic ulcer disease without any improvement. Therefore we'll consult GI for possible EGD. In the meantime also patient on Levsin 125 mcg by mouth q. 4 hours when necessary abdominal cramping.  2. Other and unspecified hyperlipidemia Go LDL is less than 100. Begin Lipitor 20  mg by mouth  3. Type II or unspecified type diabetes mellitus without mention of complication, not stated as uncontrolled I recommended a low carbohydrate diet, increasing aerobic exercise, and 10-15 pounds weight loss. Recheck fasting lipid panel and hemoglobin A1c in 3 months. Also consult a diabetic nutritionist for the patient.  08/22/13 She is here today for follow up.  Fortunately, her stress test was normal.  She has seen GI.  The A/P of GI's consultation is listed below:  53 year old with recurrent dyspepsia, worsened by eating. However, no weight loss, nausea, vomiting, melena, or dysphagia noted. Similar to symptoms prior to cholecystectomy. EGD fairly recent in Jan 2013; CT also performed around that time. Only change in medication includes addition of fish oil. She has tried and failed Protonix and Prilosec. Labs unrevealing thus far. Unclear etiology at this point. Could be dealing with gastritis, non-ulcer dyspepsia, doubt pancreatitis or occult malignancy. I have asked her to stop taking fish oil for now, stop Protonix, and start Dexilant once daily. She is to call me next Tuesday with an update. Depending on her symptoms, we will proceed with either an EGD or CT abd/pelvis. She is not acutely ill and in good spirits at time of visit.    Patient is only about 10-30% better since beginning [place].   She continues to complain of epigastric discomfort. It is colicky in nature. There are no exacerbating or alleviating factors. It is not tender the trigger by food. She denies any melena or hematochezia.  Past Medical History  Diagnosis Date  . Mixed hyperlipidemia   . Essential hypertension, benign   . Hemorrhoids   . S/P colonoscopy Jan 2011    Dr. Benson Norway: sessile polyp (benign lymphoid), large hemorrhoids, repeat 5-10 years  . CKD (chronic kidney disease) stage 3, GFR 30-59 ml/min   . GERD (gastroesophageal reflux disease)   . Type 2 diabetes mellitus    Past Surgical History  Procedure Laterality Date  . Abdominal hysterectomy    . Esophagogastroduodenoscopy  09/05/2011    [id number] hiatal hernia; remainder of exam normal. No explanation for patient's abdominal pain with today's examination  . Cholecystectomy  09/29/2011    Procedure: LAPAROSCOPIC CHOLECYSTECTOMY;  Surgeon: Jamesetta So, MD;  Location: [facility];  Service: General;  Laterality: N/A;  . Laparoscopic appendectomy  09/29/2011    Procedure: APPENDECTOMY LAPAROSCOPIC;  Surgeon: Jamesetta So, MD;  Location: [facility];  Service: General;;  incidental appendectomy  . Colonoscopy  Jan 2011    Dr. Benson Norway: sessile polyp (benign lymphoid), large hemorrhoids, repeat 5-10 years   Current Outpatient Prescriptions on File Prior to Visit  Medication Sig Dispense Refill  . amLODipine (NORVASC) 10 MG tablet Take 10 mg by mouth daily.      Marland Kitchen aspirin EC 81 MG tablet Take 81 mg by mouth daily.        Marland Kitchen BYSTOLIC 20 MG TABS TAKE 1 TABLET (20 MG TOTAL) BY MOUTH DAILY.  30 tablet  3  . cloNIDine (CATAPRES) 0.2 MG tablet Take 1 tablet (0.2 mg total) by mouth 2 (two) times daily.  60 tablet  11  . dexlansoprazole (DEXILANT) 60 MG capsule Take 1 capsule (60 mg total) by mouth daily.  30 capsule  3  . hyoscyamine (LEVSIN) 0.125 MG/ML solution Take 1 mL (0.125 mg total) by mouth every 4 (four) hours as needed.  15 mL  0  . losartan (COZAAR) 100 MG  tablet Take 1 tablet (100 mg total) by mouth daily.  90 tablet  4   No current facility-administered medications on file prior to visit.   No Known Allergies History   Social History  . Marital Status: Married    Spouse Name: N/A    Number of Children: N/A  . 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 Smoking status: Never Smoker   . Smokeless tobacco: Not on file  . Alcohol Use: No  . Drug Use: No  . Sexual Activity: Yes   Other Topics Concern  . Not on file   Social History Narrative   Works at [employer] in [place].    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   Also has to get items from high shelves-causes achy pain in shoulder area      Married.   Children are grown, out of house.       Review of Systems  All other systems reviewed and are negative.       Objective:   Physical Exam  Vitals reviewed. Constitutional: She appears well-developed and well-nourished.  Neck: Neck supple. No JVD present. No thyromegaly present.  Cardiovascular: Normal rate, regular rhythm and normal heart sounds.  Exam reveals no gallop and no friction rub.   No murmur heard. Pulmonary/Chest: Effort normal and breath sounds normal. No respiratory distress. She has no wheezes. She has no rales. She exhibits no tenderness.  Abdominal: Soft. Bowel sounds are normal. She exhibits no distension and no mass. There is no tenderness. There is no rebound and no guarding.  Lymphadenopathy:    She has no cervical adenopathy.          Assessment & Plan:  1. Dyspepsia and disorder of function of stomach Continue dexilant, add carafate 1 g poqachs.  If no better in 2 -3 weeks, recommend EGD and if normal, proceed with CT of abd/pelvis.

## 2013-08-29 ENCOUNTER — Ambulatory Visit (HOSPITAL_COMMUNITY)
Admission: RE | Admit: 2013-08-29 | Discharge: 2013-08-29 | Disposition: A | Discharge status: Home / Self Care | Source: Ambulatory Visit | Attending: Family Medicine | Admitting: Family Medicine

## 2013-08-29 DIAGNOSIS — Z139 Encounter for screening, unspecified: Secondary | ICD-10-CM

## 2013-08-29 DIAGNOSIS — Z1231 Encounter for screening mammogram for malignant neoplasm of breast: Secondary | ICD-10-CM | POA: Insufficient documentation

## 2013-10-20 ENCOUNTER — Encounter: Payer: Self-pay | Admitting: Family Medicine

## 2013-10-20 ENCOUNTER — Ambulatory Visit (INDEPENDENT_AMBULATORY_CARE_PROVIDER_SITE_OTHER): Admitting: Family Medicine

## 2013-10-20 VITALS — BP 130/82 | HR 76 | Temp 97.1°F | Resp 16 | Ht 62.0 in | Wt 171.0 lb

## 2013-10-20 DIAGNOSIS — R1013 Epigastric pain: Secondary | ICD-10-CM

## 2013-10-20 DIAGNOSIS — E119 Type 2 diabetes mellitus without complications: Secondary | ICD-10-CM

## 2013-10-20 DIAGNOSIS — G8929 Other chronic pain: Secondary | ICD-10-CM

## 2013-10-20 LAB — LIPID PANEL
Cholesterol: 218 mg/dL — ABNORMAL HIGH (ref 0–200)
HDL: 51 mg/dL (ref >39)
LDL Cholesterol: 144 mg/dL — ABNORMAL HIGH (ref 0–99)
Total CHOL/HDL Ratio: 4.3 Ratio
Triglycerides: 115 mg/dL (ref <150)
VLDL: 23 mg/dL (ref 0–40)

## 2013-10-20 LAB — COMPLETE METABOLIC PANEL WITH GFR
ALT: 16 U/L (ref 0–35)
AST: 11 U/L (ref 0–37)
Albumin: 3.8 g/dL (ref 3.5–5.2)
Alkaline Phosphatase: 89 U/L (ref 39–117)
BUN: 24 mg/dL — ABNORMAL HIGH (ref 6–23)
CO2: 26 mEq/L (ref 19–32)
Calcium: 9.2 mg/dL (ref 8.4–10.5)
Chloride: 105 mEq/L (ref 96–112)
Creat: 1.71 mg/dL — ABNORMAL HIGH (ref 0.50–1.10)
GFR, Est African American: 39 mL/min — ABNORMAL LOW
GFR, Est Non African American: 34 mL/min — ABNORMAL LOW
Glucose, Bld: 118 mg/dL — ABNORMAL HIGH (ref 70–99)
Potassium: 4.1 mEq/L (ref 3.5–5.3)
Sodium: 140 mEq/L (ref 135–145)
Total Bilirubin: 0.4 mg/dL (ref 0.2–1.2)
Total Protein: 6.9 g/dL (ref 6.0–8.3)

## 2013-10-20 LAB — HEMOGLOBIN A1C
Hgb A1c MFr Bld: 6.5 % — ABNORMAL HIGH (ref <5.7)
Mean Plasma Glucose: 140 mg/dL — ABNORMAL HIGH (ref <117)

## 2013-10-20 NOTE — Progress Notes (Signed)
Subjective:    Patient ID: Megan [name], female    DOB: 12-Apr-1961, 53 y.o.   MRN: [id number]  HPI 07/21/13 Please see most recent office visit. The patient has been taking protonic twice a day as well as sucralfate. The chest pain has not improved. Furthermore she has developed crampy epigastric abdominal pain. She denies any melena or hematochezia. She denies any nausea or vomiting. She denies any constipation. There no exacerbating or alleviating factors. Chest history of a cholecystectomy. She denies any history of pancreatitis.. Most recent lab work is listed below: No visits with results within 2 Month(s) from this visit. Latest known visit with results is:  Orders Only on 07/05/2013  Component Date Value Ref Range Status  . Total Bilirubin 07/05/2013 0.4  0.3 - 1.2 mg/dL Final  . Bilirubin, Direct 07/05/2013 0.1  0.0 - 0.3 mg/dL Final  . Indirect Bilirubin 07/05/2013 0.3  0.0 - 0.9 mg/dL Final  . Alkaline Phosphatase 07/05/2013 97  39 - 117 U/L Final  . AST 07/05/2013 12  0 - 37 U/L Final  . ALT 07/05/2013 12  0 - 35 U/L Final  . Total Protein 07/05/2013 6.8  6.0 - 8.3 g/dL Final  . Albumin 07/05/2013 3.5  3.5 - 5.2 g/dL Final   was found to have an elevated LDL at 150 and hemoglobin A1c was elevated at 6.8. She's here today to discuss medical therapy for these options.  At that time, my plan was: 1. Chest pain, unspecified Patient has seen cardiology. She had a stress test this morning. I do not have the results of this. Apparently she continues to have chest pain and epigastric abdominal pain. I have treated her empirically for peptic ulcer disease without any improvement. Therefore we'll consult GI for possible EGD. In the meantime also patient on Levsin 125 mcg by mouth q. 4 hours when necessary abdominal cramping.  2. Other and unspecified hyperlipidemia Go LDL is less than 100. Begin Lipitor 20 mg by mouth  3. Type II or unspecified type diabetes mellitus without  mention of complication, not stated as uncontrolled I recommended a low carbohydrate diet, increasing aerobic exercise, and 10-15 pounds weight loss. Recheck fasting lipid panel and hemoglobin A1c in 3 months. Also consult a diabetic nutritionist for the patient.  08/22/13 She is here today for follow up.  Fortunately, her stress test was normal.  She has seen GI.  The A/P of GI's consultation is listed below:  53 year old with recurrent dyspepsia, worsened by eating. However, no weight loss, nausea, vomiting, melena, or dysphagia noted. Similar to symptoms prior to cholecystectomy. EGD fairly recent in Jan 2013; CT also performed around that time. Only change in medication includes addition of fish oil. She has tried and failed Protonix and Prilosec. Labs unrevealing thus far. Unclear etiology at this point. Could be dealing with gastritis, non-ulcer dyspepsia, doubt pancreatitis or occult malignancy. I have asked her to stop taking fish oil for now, stop Protonix, and start Dexilant once daily. She is to call me next Tuesday with an update. Depending on her symptoms, we will proceed with either an EGD or CT abd/pelvis. She is not acutely ill and in good spirits at time of visit.    Patient is only about 10-30% better since beginning [place].  She continues to complain of epigastric discomfort. It is colicky in nature. There are no exacerbating or alleviating factors. It is not tender the trigger by food. She denies any melena or hematochezia.  At  that time, my plan was: 1. Dyspepsia and disorder of function of stomach Continue dexilant, add carafate 1 g poqachs.  If no better in 2 -3 weeks, recommend EGD and if normal, proceed with CT of abd/pelvis.  10/20/13 Patient is here today to followup. She is due for repeat hemoglobin A1c. We last checked this in November and found to be elevated at 6.8. She has tried changing her diet and decreasing carbohydrates that she is consuming. She denies any polyuria  polydipsia or blurred vision. She continues to have occasional epigastric abdominal pain. It occurs 3-4 times a week even on the combination of dexilant and carafate.  Past Medical History  Diagnosis Date  . Mixed hyperlipidemia   . Essential hypertension, benign   . Hemorrhoids   . S/P colonoscopy Jan 2011    Dr. Benson Norway: sessile polyp (benign lymphoid), large hemorrhoids, repeat 5-10 years  . CKD (chronic kidney disease) stage 3, GFR 30-59 ml/min   . GERD (gastroesophageal reflux disease)   . Type 2 diabetes mellitus    Past Surgical History  Procedure Laterality Date  . Abdominal hysterectomy    . Esophagogastroduodenoscopy  09/05/2011    [id number] hiatal hernia; remainder of exam normal. No explanation for patient's abdominal pain with today's examination  . Cholecystectomy  09/29/2011    Procedure: LAPAROSCOPIC CHOLECYSTECTOMY;  Surgeon: Jamesetta So, MD;  Location: [facility];  Service: General;  Laterality: N/A;  . Laparoscopic appendectomy  09/29/2011    Procedure: APPENDECTOMY LAPAROSCOPIC;  Surgeon: Jamesetta So, MD;  Location: [facility];  Service: General;;  incidental appendectomy  . Colonoscopy  Jan 2011    Dr. Benson Norway: sessile polyp (benign lymphoid), large hemorrhoids, repeat 5-10 years   Current Outpatient Prescriptions on File Prior to Visit  Medication Sig Dispense Refill  . amLODipine (NORVASC) 10 MG tablet Take 10 mg by mouth daily.      Marland Kitchen aspirin EC 81 MG tablet Take 81 mg by mouth daily.        Marland Kitchen BYSTOLIC 20 MG TABS TAKE 1 TABLET (20 MG TOTAL) BY MOUTH DAILY.  30 tablet  3  . cloNIDine (CATAPRES) 0.2 MG tablet Take 1 tablet (0.2 mg total) by mouth 2 (two) times daily.  60 tablet  11  . dexlansoprazole (DEXILANT) 60 MG capsule Take 1 capsule (60 mg total) by mouth daily.  30 capsule  3  . hyoscyamine (LEVSIN) 0.125 MG/ML solution Take 1 mL (0.125 mg total) by mouth every 4 (four) hours as needed.  15 mL  0  . losartan (COZAAR) 100 MG tablet Take 1 tablet (100 mg total) by  mouth daily.  90 tablet  4   No current facility-administered medications on file prior to visit.   No Known Allergies History   Social History  . Marital Status: Married    Spouse Name: N/A    Number of Children: N/A  . 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 Smoking status: Never Smoker   . Smokeless tobacco: Not on file  . Alcohol Use: No  . Drug Use: No  . Sexual Activity: Yes   Other Topics Concern  . Not on file   Social History Narrative   Works at [employer] in [place].    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   Also has to get items from high shelves-causes achy pain in shoulder area  Married.   Children are grown, out of house.       Review of Systems  All other systems reviewed and are negative.       Objective:   Physical Exam  Vitals reviewed. Constitutional: She appears well-developed and well-nourished.  Neck: Neck supple. No JVD present. No thyromegaly present.  Cardiovascular: Normal rate, regular rhythm and normal heart sounds.  Exam reveals no gallop and no friction rub.   No murmur heard. Pulmonary/Chest: Effort normal and breath sounds normal. No respiratory distress. She has no wheezes. She has no rales. She exhibits no tenderness.  Abdominal: Soft. Bowel sounds are normal. She exhibits no distension and no mass. There is no tenderness. There is no rebound and no guarding.  Lymphadenopathy:    She has no cervical adenopathy.          Assessment & Plan:  1. Type II or unspecified type diabetes mellitus without mention of complication, not stated as uncontrolled Check hemoglobin A1c. If it continues to be elevated start the patient on Tradjenda 5 mg poqday. - COMPLETE METABOLIC PANEL WITH GFR - Hemoglobin A1c - Lipid panel  2. Abdominal pain, chronic, epigastric Patient's abdominal pain persists, recommend GI consultation for EGD.  She may also benefit from a  gastric emptying study although I doubt gastroparesis cause this type of pain. - Ambulatory referral to Gastroenterology

## 2013-10-23 ENCOUNTER — Other Ambulatory Visit: Payer: Self-pay | Admitting: Family Medicine

## 2013-10-23 DIAGNOSIS — E785 Hyperlipidemia, unspecified: Secondary | ICD-10-CM

## 2013-10-23 DIAGNOSIS — R799 Abnormal finding of blood chemistry, unspecified: Secondary | ICD-10-CM

## 2013-10-23 DIAGNOSIS — E119 Type 2 diabetes mellitus without complications: Secondary | ICD-10-CM

## 2013-10-23 DIAGNOSIS — Z79899 Other long term (current) drug therapy: Secondary | ICD-10-CM

## 2013-10-23 DIAGNOSIS — I10 Essential (primary) hypertension: Secondary | ICD-10-CM

## 2013-10-23 MED ORDER — ATORVASTATIN CALCIUM 20 MG PO TABS: 20.0000 mg | ORAL_TABLET | Freq: Every day | ORAL | Status: DC

## 2013-10-27 ENCOUNTER — Encounter: Payer: Self-pay | Admitting: Gastroenterology

## 2013-11-03 ENCOUNTER — Telehealth: Payer: Self-pay | Admitting: Family Medicine

## 2013-11-03 NOTE — Telephone Encounter (Signed)
Data the medication. I do not believe Lipitor is causing her stomach pain but let's see if the stomach pain subsides after she discontinues Lipitor. Call back in 3 or 4 days or sooner if worse

## 2013-11-03 NOTE — Telephone Encounter (Signed)
She does not see GI until 11/25/13

## 2013-11-03 NOTE — Telephone Encounter (Signed)
Pt aware and will call back in a few days

## 2013-11-03 NOTE — Telephone Encounter (Signed)
Pt started taking Lipitor about 1 week ago and now is having terrible stomach pain again and did not want to stop the medication without you saying it was ok.

## 2013-11-25 ENCOUNTER — Encounter: Payer: Self-pay | Admitting: Gastroenterology

## 2013-11-25 ENCOUNTER — Ambulatory Visit (INDEPENDENT_AMBULATORY_CARE_PROVIDER_SITE_OTHER): Admitting: Gastroenterology

## 2013-11-25 VITALS — BP 142/90 | HR 66 | Temp 98.2°F | Ht 63.0 in | Wt 168.2 lb

## 2013-11-25 DIAGNOSIS — R1013 Epigastric pain: Secondary | ICD-10-CM

## 2013-11-25 NOTE — Patient Instructions (Signed)
I would like you to start taking Amitiza 1 capsule with food twice a day. You must take with food to avoid nausea. Start out by taking just 1 capsule each evening for 3 nights, then advance to twice a day. Let me know how you do with this! I will send to pharmacy if this does well.   I have ordered a CT scan. If this is normal and addressing constipation does not help, we may need to do an upper endoscopy again. For now, let's try to aggressively treat constipation.

## 2013-11-25 NOTE — Progress Notes (Signed)
Referring Provider: [name], MD Primary Care Physician:  Odette Fraction, MD Primary GI: Dr. [name]   Chief Complaint  Patient presents with  . Abdominal Pain    HPI:   Megan [name] is a 53 year old female last seen in Dec 2014 for epigastric pain. Last EGD Jan 2013. Overall normal. Subsequent cholecystectomy done that year due to biliary dyskinesia. Occasional indigestion at that visit. Tried/failed Protonix and Prilosec. I prescribed Dexilant and asked her to call with an update. Consideration for EGD was raised at that time. Stopped Dexilant. Had been given Carafate but stopped this as well. Saw no improvement with these agents. Notes epigastric pain, constant, sometimes cramping with water. Waxing and waning in intensity. Underlying nausea but no vomiting. Not affected by eating. No NSAIDs. No aspirin powders. No melena. Has BM maybe once or twice a week. Went out to eat and started cramping in upper abdomen, followed by BMs. Sometimes may feel a little better after BM. Appetite waxes and wanes. Cramping with food, water at times.   Past Medical History  Diagnosis Date  . Mixed hyperlipidemia   . Essential hypertension, benign   . Hemorrhoids   . S/P colonoscopy Jan 2011    Dr. Benson Norway: sessile polyp (benign lymphoid), large hemorrhoids, repeat 5-10 years  . CKD (chronic kidney disease) stage 3, GFR 30-59 ml/min   . GERD (gastroesophageal reflux disease)   . Type 2 diabetes mellitus     Past Surgical History  Procedure Laterality Date  . Abdominal hysterectomy    . Esophagogastroduodenoscopy  09/05/2011    [id number] hiatal hernia; remainder of exam normal. No explanation for patient's abdominal pain with today's examination  . Cholecystectomy  09/29/2011    Procedure: LAPAROSCOPIC CHOLECYSTECTOMY;  Surgeon: Jamesetta So, MD;  Location: [facility];  Service: General;  Laterality: N/A;  . Laparoscopic appendectomy  09/29/2011    Procedure: APPENDECTOMY LAPAROSCOPIC;   Surgeon: Jamesetta So, MD;  Location: [facility];  Service: General;;  incidental appendectomy  . Colonoscopy  Jan 2011    Dr. Benson Norway: sessile polyp (benign lymphoid), large hemorrhoids, repeat 5-10 years    Current Outpatient Prescriptions  Medication Sig Dispense Refill  . amLODipine (NORVASC) 10 MG tablet Take 10 mg by mouth daily.      Marland Kitchen aspirin EC 81 MG tablet Take 81 mg by mouth daily.        Marland Kitchen atorvastatin (LIPITOR) 20 MG tablet Take 1 tablet (20 mg total) by mouth daily.  30 tablet  3  . BYSTOLIC 20 MG TABS TAKE 1 TABLET (20 MG TOTAL) BY MOUTH DAILY.  30 tablet  3  . cloNIDine (CATAPRES) 0.2 MG tablet Take 1 tablet (0.2 mg total) by mouth 2 (two) times daily.  60 tablet  11  . losartan (COZAAR) 100 MG tablet Take 50 mg by mouth daily.      Marland Kitchen dexlansoprazole (DEXILANT) 60 MG capsule Take 1 capsule (60 mg total) by mouth daily.  30 capsule  3  . hyoscyamine (LEVSIN) 0.125 MG/ML solution Take 1 mL (0.125 mg total) by mouth every 4 (four) hours as needed.  15 mL  0   No current facility-administered medications for this visit.    Allergies as of 11/25/2013  . (No Known Allergies)    Family History  Problem Relation Age of Onset  . Colon cancer Neg Hx   . Hypertension Mother   . Coronary artery disease Mother   . Diabetes Mother   . Hypertension  Sister   . Coronary artery disease Sister   . Hypertension Brother     History   Social History  . Marital Status: Married    Spouse Name: N/A    Number of Children: N/A  . 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 Smoking status: Never Smoker   . Smokeless tobacco: None  . Alcohol Use: No  . Drug Use: No  . Sexual Activity: Yes   Other Topics Concern  . None   Social History Narrative   Works at [employer] in [place].    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   Also has to get items from high shelves-causes achy pain in shoulder area      Married.    Children are grown, out of house.    Review of Systems: As noted in HPI.   Physical Exam: BP 142/90  Pulse 66  Temp(Src) 98.2 F (36.8 C) (Oral)  Ht 5\' 3"  (1.6 m)  Wt 168 lb 3.2 oz (76.295 kg)  BMI 29.80 kg/m2 General:   Alert and oriented. No distress noted. Pleasant and cooperative.  Head:  Normocephalic and atraumatic. Eyes:  Conjuctiva clear without scleral icterus. Mouth:  Oral mucosa pink and moist. Good dentition. No lesions. Heart:  S1, S2 present without murmurs, rubs, or gallops. Regular rate and rhythm. Abdomen:  +BS, soft, mild TTP epigastric region and non-distended. No rebound or guarding. No HSM or masses noted. Negative Carnett's sign.  Msk:  Symmetrical without gross deformities. Normal posture. Extremities:  Without edema. Neurologic:  Alert and  oriented x4;  grossly normal neurologically. Skin:  Intact without significant lesions or rashes. Psych:  Alert and cooperative. Normal mood and affect.  Lab Results  Component Value Date   ALT 16 10/20/2013   AST 11 10/20/2013   ALKPHOS 89 10/20/2013   BILITOT 0.4 10/20/2013

## 2013-11-26 DIAGNOSIS — R1013 Epigastric pain: Secondary | ICD-10-CM | POA: Insufficient documentation

## 2013-11-26 NOTE — Assessment & Plan Note (Signed)
Chronic, persistent despite cholecystectomy. EGD normal Jan 2013. Labs have been unrevealing in the past. No improvement with PPI. Appears she has had some correlation with constipation and possible relief after BMs. Notable constipation without bowel regimen. Doubt EGD will offer any further insight. I would like to trial Amitiza 24 mcg po BID. Samples provided. Will proceed with CT now to assess for any underlying pathology. Differentials include IBS, NUD, ?gastritis, doubt hepatobiliary etiology. May benefit from pain management in future.

## 2013-11-27 ENCOUNTER — Ambulatory Visit (HOSPITAL_COMMUNITY)
Admission: RE | Admit: 2013-11-27 | Discharge: 2013-11-27 | Disposition: A | Discharge status: Home / Self Care | Source: Ambulatory Visit | Attending: Gastroenterology | Admitting: Gastroenterology

## 2013-11-27 DIAGNOSIS — R109 Unspecified abdominal pain: Secondary | ICD-10-CM | POA: Insufficient documentation

## 2013-11-27 DIAGNOSIS — R944 Abnormal results of kidney function studies: Secondary | ICD-10-CM | POA: Insufficient documentation

## 2013-11-27 DIAGNOSIS — R1013 Epigastric pain: Secondary | ICD-10-CM | POA: Insufficient documentation

## 2013-11-27 DIAGNOSIS — Q618 Other cystic kidney diseases: Secondary | ICD-10-CM | POA: Insufficient documentation

## 2013-11-27 MED ORDER — IOHEXOL 300 MG/ML  SOLN
80.0000 mL | Freq: Once | INTRAMUSCULAR | Status: AC | PRN
  Administered 2013-11-27: 80 mL via INTRAVENOUS

## 2013-11-27 NOTE — Progress Notes (Signed)
cc'd to pcp 

## 2013-12-03 ENCOUNTER — Telehealth: Payer: Self-pay | Admitting: Internal Medicine

## 2013-12-03 NOTE — Telephone Encounter (Signed)
See result note.  

## 2013-12-03 NOTE — Telephone Encounter (Signed)
Patient called today asking if her CT results were back yet. She said it was done on the 16th and she hasn't heard anything. [id number]

## 2013-12-03 NOTE — Telephone Encounter (Signed)
Routing to AS 

## 2013-12-03 NOTE — Telephone Encounter (Signed)
Pt is aware of results. 

## 2013-12-03 NOTE — Progress Notes (Signed)
Quick Note:  No acute findings on CT.  How is patient ______

## 2013-12-10 NOTE — Progress Notes (Signed)
Quick Note:  Offer EGD with Dr. [name]. May ultimately need pain management referral. ______

## 2013-12-11 ENCOUNTER — Telehealth: Payer: Self-pay | Admitting: Family Medicine

## 2013-12-11 DIAGNOSIS — I10 Essential (primary) hypertension: Secondary | ICD-10-CM

## 2013-12-11 MED ORDER — CLONIDINE HCL 0.2 MG PO TABS: 0.2000 mg | ORAL_TABLET | Freq: Two times a day (BID) | ORAL | Status: DC

## 2013-12-11 MED ORDER — NEBIVOLOL HCL 20 MG PO TABS: ORAL_TABLET | ORAL | Status: DC

## 2013-12-11 MED ORDER — ATORVASTATIN CALCIUM 20 MG PO TABS: 20.0000 mg | ORAL_TABLET | Freq: Every day | ORAL | Status: DC

## 2013-12-11 MED ORDER — AMLODIPINE BESYLATE 10 MG PO TABS: 10.0000 mg | ORAL_TABLET | Freq: Every day | ORAL | Status: DC

## 2013-12-11 MED ORDER — LOSARTAN POTASSIUM 50 MG PO TABS: 50.0000 mg | ORAL_TABLET | Freq: Every day | ORAL | Status: DC

## 2013-12-11 NOTE — Telephone Encounter (Signed)
Message copied by Alyson Locket on Thu Dec 11, 2013  5:04 PM ------      Message from: Lenore Manner      Created: Thu Dec 11, 2013  2:27 PM      Regarding: RX      Contact: [phone]       Pt is needing to speak to you about her prescriptions ------

## 2013-12-11 NOTE — Telephone Encounter (Signed)
Needs all meds for 90 day printed so she can mail to pharmacy - Med printed.

## 2013-12-16 ENCOUNTER — Other Ambulatory Visit: Payer: Self-pay | Admitting: Internal Medicine

## 2013-12-16 ENCOUNTER — Encounter (HOSPITAL_COMMUNITY): Payer: Self-pay | Admitting: Pharmacy Technician

## 2013-12-16 DIAGNOSIS — G8929 Other chronic pain: Secondary | ICD-10-CM

## 2013-12-16 DIAGNOSIS — R1013 Epigastric pain: Principal | ICD-10-CM

## 2013-12-16 NOTE — Progress Notes (Signed)
Quick Note:  EGD is scheduled w/RMR on 12/17/13 and she understands instructions ______

## 2013-12-17 ENCOUNTER — Ambulatory Visit (HOSPITAL_COMMUNITY)
Admission: RE | Admit: 2013-12-17 | Discharge: 2013-12-17 | Disposition: A | Discharge status: Home / Self Care | Source: Ambulatory Visit | Attending: Internal Medicine | Admitting: Internal Medicine

## 2013-12-17 ENCOUNTER — Encounter (HOSPITAL_COMMUNITY)
Admission: RE | Disposition: A | Discharge status: Home / Self Care | Payer: Self-pay | Source: Ambulatory Visit | Attending: Internal Medicine

## 2013-12-17 ENCOUNTER — Encounter (HOSPITAL_COMMUNITY): Payer: Self-pay | Admitting: *Deleted

## 2013-12-17 DIAGNOSIS — K259 Gastric ulcer, unspecified as acute or chronic, without hemorrhage or perforation: Secondary | ICD-10-CM

## 2013-12-17 DIAGNOSIS — R143 Flatulence: Secondary | ICD-10-CM

## 2013-12-17 DIAGNOSIS — K2960 Other gastritis without bleeding: Secondary | ICD-10-CM

## 2013-12-17 DIAGNOSIS — K2940 Chronic atrophic gastritis without bleeding: Secondary | ICD-10-CM | POA: Insufficient documentation

## 2013-12-17 DIAGNOSIS — G8929 Other chronic pain: Secondary | ICD-10-CM

## 2013-12-17 DIAGNOSIS — R141 Gas pain: Secondary | ICD-10-CM | POA: Insufficient documentation

## 2013-12-17 DIAGNOSIS — R1013 Epigastric pain: Secondary | ICD-10-CM

## 2013-12-17 DIAGNOSIS — R142 Eructation: Secondary | ICD-10-CM

## 2013-12-17 HISTORY — PX: ESOPHAGOGASTRODUODENOSCOPY: SHX5428

## 2013-12-17 SURGERY — EGD (ESOPHAGOGASTRODUODENOSCOPY)
Anesthesia: Moderate Sedation

## 2013-12-17 MED ORDER — MIDAZOLAM HCL 5 MG/5ML IJ SOLN
INTRAMUSCULAR | Status: AC
  Filled 2013-12-17: qty 10

## 2013-12-17 MED ORDER — STERILE WATER FOR IRRIGATION IR SOLN
Status: DC | PRN
  Administered 2013-12-17: 11:00:00

## 2013-12-17 MED ORDER — MEPERIDINE HCL 100 MG/ML IJ SOLN
INTRAMUSCULAR | Status: AC
  Filled 2013-12-17: qty 2

## 2013-12-17 MED ORDER — ONDANSETRON HCL 4 MG/2ML IJ SOLN
INTRAMUSCULAR | Status: DC | PRN
  Administered 2013-12-17: 4 mg via INTRAVENOUS

## 2013-12-17 MED ORDER — LIDOCAINE VISCOUS 2 % MT SOLN
OROMUCOSAL | Status: AC
  Filled 2013-12-17: qty 15

## 2013-12-17 MED ORDER — ONDANSETRON HCL 4 MG/2ML IJ SOLN
INTRAMUSCULAR | Status: AC
  Filled 2013-12-17: qty 2

## 2013-12-17 MED ORDER — MIDAZOLAM HCL 5 MG/5ML IJ SOLN
INTRAMUSCULAR | Status: DC | PRN
  Administered 2013-12-17: 2 mg via INTRAVENOUS
  Administered 2013-12-17: 1 mg via INTRAVENOUS

## 2013-12-17 MED ORDER — LIDOCAINE VISCOUS 2 % MT SOLN
OROMUCOSAL | Status: DC | PRN
  Administered 2013-12-17: 1 via OROMUCOSAL

## 2013-12-17 MED ORDER — SODIUM CHLORIDE 0.9 % IV SOLN
INTRAVENOUS | Status: DC
  Administered 2013-12-17: 09:00:00 via INTRAVENOUS

## 2013-12-17 MED ORDER — MEPERIDINE HCL 100 MG/ML IJ SOLN
INTRAMUSCULAR | Status: DC | PRN
  Administered 2013-12-17: 25 mg via INTRAVENOUS
  Administered 2013-12-17: 50 mg via INTRAVENOUS

## 2013-12-17 NOTE — Interval H&P Note (Signed)
History and Physical Interval Note:  12/17/2013 10:30 AM  Megan [name]  has presented today for surgery, with the diagnosis of EPIGASTRIC ABDOMINAL PAIN AND BLOATING  The various methods of treatment have been discussed with the patient and family. After consideration of risks, benefits and other options for treatment, the patient has consented to  Procedure(s) with comments: ESOPHAGOGASTRODUODENOSCOPY (EGD) (N/A) - 9:30 as a surgical intervention .  The patient's history has been reviewed, patient examined, no change in status, stable for surgery.  I have reviewed the patient's chart and labs.  Questions were answered to the patient's satisfaction.     No change. EGD per plan.The risks, benefits, limitations, alternatives and imponderables have been reviewed with the patient. Potential for esophageal dilation, biopsy, etc. have also been reviewed.  Questions have been answered. All parties agreeable.  [name] Severin Bou

## 2013-12-17 NOTE — Op Note (Signed)
[facility] [address] [place], [address]   ENDOSCOPY PROCEDURE REPORT  PATIENT: Megan [name], Megan [name]  MR#: [id number] BIRTHDATE: 04-29-1961 , 52  yrs. old GENDER: Female ENDOSCOPIST: R.  Garfield Cornea, MD FACP FACG REFERRED BY:  Jenna Luo, M.D. PROCEDURE DATE:  12/17/2013 PROCEDURE:     EGD with gastric biopsy  INDICATIONS:     Chronic abdominal pain  INFORMED CONSENT:   The risks, benefits, limitations, alternatives and imponderables have been discussed.  The potential for biopsy, esophogeal dilation, etc. have also been reviewed.  Questions have been answered.  All parties agreeable.  Please see the history and physical in the medical record for more information.  MEDICATIONS: Versed 3 mg IV and Demerol 75 mg IV in divided doses. Xylocaine gel orally. Zofran 4 mg IV  DESCRIPTION OF PROCEDURE:   The [id number]  endoscope was introduced through the mouth and advanced to the second portion of the duodenum without difficulty or limitations.  The mucosal surfaces were surveyed very carefully during advancement of the scope and upon withdrawal.  Retroflexion view of the proximal stomach and esophagogastric junction was performed.      FINDINGS:   Normal esophagus. Stomach empty. Diffuse patchy gastric erythema and scattered erosions. No ulcer or infiltrating process. Patent pylorus. Normal first and second portion of the duodenum.  THERAPEUTIC / DIAGNOSTIC MANEUVERS PERFORMED:  Biopsies the abnormal gastric mucosa taken for histologic study.   COMPLICATIONS:  None  IMPRESSION:   Gastric erythema and erosions of uncertain significance-status post gastric biopsy. I'm doubtful today's findings would fully explain her abdominal pain. Moreover, I also am not sure aberrant positioning of the cecum has anything to do with her symptoms.  RECOMMENDATIONS:  Followup on pathology. Depending on pathology findings, may consider a one-time colon  purge to see if this makes any difference in her symptoms.    _______________________________ R. Garfield Cornea, MD FACP [facility] eSigned:  R. Garfield Cornea, MD FACP [facility] 12/17/2013 11:00 AM     CC:

## 2013-12-17 NOTE — H&P (View-Only) (Signed)
Quick Note:  Offer EGD with Dr. [name]. May ultimately need pain management referral. ______

## 2013-12-17 NOTE — Discharge Instructions (Signed)
EGD °Discharge instructions °Please read the instructions outlined below and refer to this sheet in the next few weeks. These discharge instructions provide you with general information on caring for yourself after you leave the hospital. Your doctor may also give you specific instructions. While your treatment has been planned according to the most current medical practices available, unavoidable complications occasionally occur. If you have any problems or questions after discharge, please call your doctor. °ACTIVITY °· You may resume your regular activity but move at a slower pace for the next 24 hours.  °· Take frequent rest periods for the next 24 hours.  °· Walking will help expel (get rid of) the air and reduce the bloated feeling in your abdomen.  °· No driving for 24 hours (because of the anesthesia (medicine) used during the test).  °· You may shower.  °· Do not sign any important legal documents or operate any machinery for 24 hours (because of the anesthesia used during the test).  °NUTRITION °· Drink plenty of fluids.  °· You may resume your normal diet.  °· Begin with a light meal and progress to your normal diet.  °· Avoid alcoholic beverages for 24 hours or as instructed by your caregiver.  °MEDICATIONS °· You may resume your normal medications unless your caregiver tells you otherwise.  °WHAT YOU CAN EXPECT TODAY °· You may experience abdominal discomfort such as a feeling of fullness or “gas” pains.  °FOLLOW-UP °· Your doctor will discuss the results of your test with you.  °SEEK IMMEDIATE MEDICAL ATTENTION IF ANY OF THE FOLLOWING OCCUR: °· Excessive nausea (feeling sick to your stomach) and/or vomiting.  °· Severe abdominal pain and distention (swelling).  °· Trouble swallowing.  °· Temperature over 101° F (37.8º C).  °· Rectal bleeding or vomiting of blood.  ° ° ° °Further recommendations to follow pending review of pathology report °

## 2013-12-18 ENCOUNTER — Other Ambulatory Visit: Payer: Self-pay | Admitting: *Deleted

## 2013-12-18 MED ORDER — LOSARTAN POTASSIUM 50 MG PO TABS: 50.0000 mg | ORAL_TABLET | Freq: Every day | ORAL | Status: DC

## 2013-12-18 MED ORDER — AMLODIPINE BESYLATE 10 MG PO TABS: 10.0000 mg | ORAL_TABLET | Freq: Every day | ORAL | Status: DC

## 2013-12-18 MED ORDER — ATORVASTATIN CALCIUM 20 MG PO TABS: 20.0000 mg | ORAL_TABLET | Freq: Every day | ORAL | Status: DC

## 2013-12-18 NOTE — Telephone Encounter (Signed)
Refill appropriate and filled per protocol. 

## 2013-12-19 ENCOUNTER — Encounter: Payer: Self-pay | Admitting: Internal Medicine

## 2013-12-20 ENCOUNTER — Encounter: Payer: Self-pay | Admitting: Internal Medicine

## 2013-12-22 ENCOUNTER — Other Ambulatory Visit: Payer: Self-pay | Admitting: Physician Assistant

## 2013-12-22 NOTE — Telephone Encounter (Signed)
Medication refilled per protocol. 

## 2013-12-23 ENCOUNTER — Encounter (HOSPITAL_COMMUNITY): Payer: Self-pay | Admitting: Internal Medicine

## 2013-12-24 ENCOUNTER — Telehealth: Payer: Self-pay | Admitting: General Practice

## 2013-12-24 NOTE — Telephone Encounter (Signed)
Let's trial Linzess 145 mcg daily. May pick up sample voucher with rx.  Has she noticed improvement with pain after bowel movements?

## 2013-12-24 NOTE — Telephone Encounter (Signed)
Patient called and has questions concerning the treatment plan you prescribed (Amitiza) because it's making her have diarrhea every morning.   Also, she is still having a lot of abdominal pain.   Please advise?

## 2013-12-26 NOTE — Telephone Encounter (Signed)
Pt is aware and has picked up samples. I gave her a voucher and told her to try the samples and let us know if they work so we can send in Rx.

## 2013-12-26 NOTE — Telephone Encounter (Signed)
Tried to call pt- LMOM. Samples at the front desk.

## 2014-01-06 ENCOUNTER — Telehealth: Payer: Self-pay | Admitting: *Deleted

## 2014-01-06 ENCOUNTER — Other Ambulatory Visit: Payer: Self-pay | Admitting: *Deleted

## 2014-01-06 MED ORDER — NEBIVOLOL HCL 10 MG PO TABS: 10.0000 mg | ORAL_TABLET | Freq: Two times a day (BID) | ORAL | Status: DC

## 2014-01-06 NOTE — Telephone Encounter (Signed)
Pt called stating that Bystolic was sent to [facility] instead of [facility] asked if we can send in new script to them, I sent in new order and called [facility] to cancel last order.

## 2014-01-14 NOTE — Interval H&P Note (Signed)
History and Physical Interval Note:  01/14/2014 12:28 PM  Megan [name]  has presented today for surgery, with the diagnosis of EPIGASTRIC ABDOMINAL PAIN AND BLOATING  The various methods of treatment have been discussed with the patient and family. After consideration of risks, benefits and other options for treatment, the patient has consented to  Procedure(s) with comments: ESOPHAGOGASTRODUODENOSCOPY (EGD) (N/A) - 9:30 as a surgical intervention .  The patient's history has been reviewed, patient examined, no change in status, stable for surgery.  I have reviewed the patient's chart and labs.  Questions were answered to the patient's satisfaction.     [name] Lynden Flemmer

## 2014-01-30 ENCOUNTER — Telehealth: Payer: Self-pay | Admitting: Family Medicine

## 2014-01-30 ENCOUNTER — Other Ambulatory Visit: Payer: Self-pay | Admitting: Family Medicine

## 2014-01-30 ENCOUNTER — Encounter: Payer: Self-pay | Admitting: Family Medicine

## 2014-01-30 DIAGNOSIS — I10 Essential (primary) hypertension: Secondary | ICD-10-CM

## 2014-01-30 MED ORDER — CLONIDINE HCL 0.2 MG PO TABS: 0.2000 mg | ORAL_TABLET | Freq: Two times a day (BID) | ORAL | Status: DC

## 2014-01-30 NOTE — Telephone Encounter (Signed)
Medication refill for one time only.  Patient needs to be seen.  Letter sent for patient to call and schedule 

## 2014-03-17 ENCOUNTER — Encounter: Payer: Self-pay | Admitting: Family Medicine

## 2014-03-17 ENCOUNTER — Other Ambulatory Visit

## 2014-03-17 ENCOUNTER — Ambulatory Visit (INDEPENDENT_AMBULATORY_CARE_PROVIDER_SITE_OTHER): Admitting: Family Medicine

## 2014-03-17 VITALS — BP 120/78 | HR 62 | Temp 97.7°F | Resp 14 | Ht 62.0 in | Wt 174.0 lb

## 2014-03-17 DIAGNOSIS — I10 Essential (primary) hypertension: Secondary | ICD-10-CM

## 2014-03-17 DIAGNOSIS — E119 Type 2 diabetes mellitus without complications: Secondary | ICD-10-CM

## 2014-03-17 DIAGNOSIS — N183 Chronic kidney disease, stage 3 unspecified: Secondary | ICD-10-CM

## 2014-03-17 DIAGNOSIS — Z79899 Other long term (current) drug therapy: Secondary | ICD-10-CM

## 2014-03-17 DIAGNOSIS — E785 Hyperlipidemia, unspecified: Secondary | ICD-10-CM

## 2014-03-17 DIAGNOSIS — R799 Abnormal finding of blood chemistry, unspecified: Secondary | ICD-10-CM

## 2014-03-17 LAB — LIPID PANEL
Cholesterol: 150 mg/dL (ref 0–200)
HDL: 37 mg/dL — ABNORMAL LOW (ref >39)
LDL Cholesterol: 82 mg/dL (ref 0–99)
Total CHOL/HDL Ratio: 4.1 Ratio
Triglycerides: 157 mg/dL — ABNORMAL HIGH (ref <150)
VLDL: 31 mg/dL (ref 0–40)

## 2014-03-17 LAB — CBC WITH DIFFERENTIAL/PLATELET
Basophils Absolute: 0 10*3/uL (ref 0.0–0.1)
Basophils Relative: 0 % (ref 0–1)
Eosinophils Absolute: 0.1 10*3/uL (ref 0.0–0.7)
Eosinophils Relative: 2 % (ref 0–5)
HCT: 37.1 % (ref 36.0–46.0)
Hemoglobin: 12.7 g/dL (ref 12.0–15.0)
Lymphocytes Relative: 33 % (ref 12–46)
Lymphs Abs: 2.2 10*3/uL (ref 0.7–4.0)
MCH: 28.5 pg (ref 26.0–34.0)
MCHC: 34.2 g/dL (ref 30.0–36.0)
MCV: 83.4 fL (ref 78.0–100.0)
Monocytes Absolute: 0.5 10*3/uL (ref 0.1–1.0)
Monocytes Relative: 8 % (ref 3–12)
Neutro Abs: 3.8 10*3/uL (ref 1.7–7.7)
Neutrophils Relative %: 57 % (ref 43–77)
Platelets: 316 10*3/uL (ref 150–400)
RBC: 4.45 MIL/uL (ref 3.87–5.11)
RDW: 16.6 % — ABNORMAL HIGH (ref 11.5–15.5)
WBC: 6.7 10*3/uL (ref 4.0–10.5)

## 2014-03-17 LAB — COMPREHENSIVE METABOLIC PANEL
ALT: 14 U/L (ref 0–35)
AST: 12 U/L (ref 0–37)
Albumin: 3.6 g/dL (ref 3.5–5.2)
Alkaline Phosphatase: 81 U/L (ref 39–117)
BUN: 24 mg/dL — ABNORMAL HIGH (ref 6–23)
CO2: 24 mEq/L (ref 19–32)
Calcium: 9 mg/dL (ref 8.4–10.5)
Chloride: 105 mEq/L (ref 96–112)
Creat: 2.03 mg/dL — ABNORMAL HIGH (ref 0.50–1.10)
Glucose, Bld: 137 mg/dL — ABNORMAL HIGH (ref 70–99)
Potassium: 4.1 mEq/L (ref 3.5–5.3)
Sodium: 139 mEq/L (ref 135–145)
Total Bilirubin: 0.2 mg/dL (ref 0.2–1.2)
Total Protein: 6.7 g/dL (ref 6.0–8.3)

## 2014-03-17 LAB — HEMOGLOBIN A1C
Hgb A1c MFr Bld: 6.7 % — ABNORMAL HIGH (ref <5.7)
Mean Plasma Glucose: 146 mg/dL — ABNORMAL HIGH (ref <117)

## 2014-03-17 MED ORDER — NEBIVOLOL HCL 10 MG PO TABS: ORAL_TABLET | ORAL | Status: DC

## 2014-03-17 NOTE — Progress Notes (Signed)
Subjective:    Patient ID: Megan [name], female    DOB: Apr 09, 1961, 53 y.o.   MRN: [id number]  HPI Patient is a very pleasant 53 year old African American female with a past medical history of diabetes mellitus type 2 which is controlled by diet. Her last diabetic eye exam was less than one year ago. She is overdue for hemoglobin A1c as well as a urine microalbumin. She denies polyuria, polydipsia, or blurred vision. She also has a history of very difficult to control hypertension. Fortunately her blood pressure is well-controlled today at 120/78. She denies any chest pain tremors or breath or dyspnea on exertion. She does complain of fatigue and somnolence on clonidine but she can accept the side effects as it seems to be controlling her blood pressure. She also complains of some swelling and tightness in her shins which seems to be related to amlodipine. However this is mild and the patient states that she can tolerate. She also has hyperlipidemia for which he takes Lipitor 20 mg by mouth daily. She denies any myalgias or right quadrant pain. Her goal LDL is less than 100. She had fasting lab work done this morning. Patient is not due for a Pap smear due to her history of a hysterectomy. Her last mammogram was less than one year ago. Past Medical History  Diagnosis Date  . Mixed hyperlipidemia   . Essential hypertension, benign   . Hemorrhoids   . S/P colonoscopy Jan 2011    Dr. Benson Norway: sessile polyp (benign lymphoid), large hemorrhoids, repeat 5-10 years  . CKD (chronic kidney disease) stage 3, GFR 30-59 ml/min   . GERD (gastroesophageal reflux disease)   . Type 2 diabetes mellitus    Past Surgical History  Procedure Laterality Date  . Abdominal hysterectomy    . Esophagogastroduodenoscopy  09/05/2011    [id number] hiatal hernia; remainder of exam normal. No explanation for patient's abdominal pain with today's examination  . Cholecystectomy  09/29/2011    Procedure: LAPAROSCOPIC  CHOLECYSTECTOMY;  Surgeon: Jamesetta So, MD;  Location: [facility];  Service: General;  Laterality: N/A;  . Laparoscopic appendectomy  09/29/2011    Procedure: APPENDECTOMY LAPAROSCOPIC;  Surgeon: Jamesetta So, MD;  Location: [facility];  Service: General;;  incidental appendectomy  . Colonoscopy  Jan 2011    Dr. Benson Norway: sessile polyp (benign lymphoid), large hemorrhoids, repeat 5-10 years  . Esophagogastroduodenoscopy N/A 12/17/2013    Procedure: ESOPHAGOGASTRODUODENOSCOPY (EGD);  Surgeon: Daneil Dolin, MD;  Location: [facility];  Service: Endoscopy;  Laterality: N/A;  9:30   Current Outpatient Prescriptions on File Prior to Visit  Medication Sig Dispense Refill  . amLODipine (NORVASC) 10 MG tablet Take 1 tablet (10 mg total) by mouth daily.  90 tablet  3  . aspirin EC 81 MG tablet Take 81 mg by mouth daily.        Marland Kitchen atorvastatin (LIPITOR) 20 MG tablet Take 1 tablet (20 mg total) by mouth daily.  90 tablet  3  . cloNIDine (CATAPRES) 0.2 MG tablet Take 1 tablet (0.2 mg total) by mouth 2 (two) times daily.  180 tablet  0  . losartan (COZAAR) 50 MG tablet Take 1 tablet (50 mg total) by mouth daily.  90 tablet  3   No current facility-administered medications on file prior to visit.   No Known Allergies History   Social History  . Marital Status: Married    Spouse Name: N/A    Number of Children: N/A  .  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 Smoking status: Never Smoker   . Smokeless tobacco: Not on file  . Alcohol Use: No  . Drug Use: No  . Sexual Activity: Yes   Other Topics Concern  . Not on file   Social History Narrative   Works at [employer] in [place].    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   Also has to get items from high shelves-causes achy pain in shoulder area      Married.   Children are grown, out of house.      Review of Systems  All other systems reviewed and are negative.        Objective:   Physical Exam  Vitals reviewed. Constitutional: She appears well-developed and well-nourished. No distress.  Eyes: Conjunctivae are normal.  Neck: Neck supple. No JVD present. No thyromegaly present.  Cardiovascular: Normal rate, regular rhythm and normal heart sounds.  Exam reveals no gallop and no friction rub.   No murmur heard. Pulmonary/Chest: Effort normal and breath sounds normal. No respiratory distress. She has no wheezes. She has no rales.  Abdominal: Soft. Bowel sounds are normal. She exhibits no distension and no mass. There is no tenderness. There is no rebound and no guarding.  Musculoskeletal: She exhibits edema.  Lymphadenopathy:    She has no cervical adenopathy.  Skin: She is not diaphoretic.          Assessment & Plan:  1. Type II or unspecified type diabetes mellitus without mention of complication, not stated as uncontrolled I will check hemoglobin A1c. Hemoglobin A1c is less than 6.5. Hemoglobin A1c is greater than 6.5, I would consider adding Januvia.  Diabetic eye exam and exam up to date. Patient is taking an aspirin - Microalbumin, urine  2. Chronic kidney disease, stage 3 (moderate) Most recent creatinine was 1.7 in March. She is overdue to recheck this today. I will also check a urine microalbumin.  If microalbumin is elevated, I increased her dose of losartan although I would need to monitor her renal function closely.  3. Unspecified essential hypertension Blood pressure is well controlled today. I'll make no changes in her medication unless her urine microalbumin is elevated. 4. HLD (hyperlipidemia) Check fasting lipid panel. Goal LDL cholesterol is less than 100.Marland Kitchen

## 2014-03-18 LAB — MICROALBUMIN, URINE: Microalb, Ur: 67.39 mg/dL — ABNORMAL HIGH (ref 0.00–1.89)

## 2014-03-19 ENCOUNTER — Encounter: Payer: Self-pay | Admitting: Family Medicine

## 2014-03-19 ENCOUNTER — Other Ambulatory Visit: Payer: Self-pay | Admitting: Family Medicine

## 2014-03-19 MED ORDER — SITAGLIPTIN PHOSPHATE 25 MG PO TABS: 25.0000 mg | ORAL_TABLET | Freq: Every day | ORAL | Status: DC

## 2014-04-11 ENCOUNTER — Other Ambulatory Visit: Payer: Self-pay | Admitting: Family Medicine

## 2014-04-11 NOTE — Telephone Encounter (Signed)
Refill appropriate and filled per protocol. 

## 2014-04-30 ENCOUNTER — Telehealth: Payer: Self-pay | Admitting: Family Medicine

## 2014-04-30 NOTE — Telephone Encounter (Signed)
Pt saw kidney MD yesterday and her BP in office was 150/90 and she wanted her to go back up on her Losartan. She is on 50mg  now as you had just reduced it at her LOV. She also wants her to go down on her Amlodipine to 5mg  as she is having increased swelling and does not want to take the fluid medications due to side effects. She wanted to know what YOU wanted her to do?

## 2014-04-30 NOTE — Telephone Encounter (Signed)
Patient says that her kidney doc is trying to change one of her medications, and she says she thinks she should listen to dr pickard instead please call her at  [phone]

## 2014-05-01 NOTE — Telephone Encounter (Signed)
Pt aware of recommendation °

## 2014-05-01 NOTE — Telephone Encounter (Signed)
I would increase her losartan as recommended because high BP will damage her kidneys further.  Decrease amlodipine as recommended.

## 2014-05-01 NOTE — Telephone Encounter (Signed)
LMTRC

## 2014-07-31 ENCOUNTER — Other Ambulatory Visit: Payer: Self-pay | Admitting: Family Medicine

## 2014-07-31 DIAGNOSIS — Z1231 Encounter for screening mammogram for malignant neoplasm of breast: Secondary | ICD-10-CM

## 2014-08-31 ENCOUNTER — Ambulatory Visit (HOSPITAL_COMMUNITY)
Admission: RE | Admit: 2014-08-31 | Discharge: 2014-08-31 | Disposition: A | Discharge status: Home / Self Care | Source: Ambulatory Visit | Attending: Family Medicine | Admitting: Family Medicine

## 2014-08-31 ENCOUNTER — Other Ambulatory Visit

## 2014-08-31 ENCOUNTER — Other Ambulatory Visit: Payer: Self-pay | Admitting: Family Medicine

## 2014-08-31 DIAGNOSIS — Z1231 Encounter for screening mammogram for malignant neoplasm of breast: Secondary | ICD-10-CM

## 2014-08-31 DIAGNOSIS — N183 Chronic kidney disease, stage 3 unspecified: Secondary | ICD-10-CM

## 2014-08-31 DIAGNOSIS — IMO0002 Reserved for concepts with insufficient information to code with codable children: Secondary | ICD-10-CM

## 2014-08-31 DIAGNOSIS — R799 Abnormal finding of blood chemistry, unspecified: Secondary | ICD-10-CM

## 2014-08-31 DIAGNOSIS — E119 Type 2 diabetes mellitus without complications: Secondary | ICD-10-CM

## 2014-08-31 DIAGNOSIS — I10 Essential (primary) hypertension: Secondary | ICD-10-CM

## 2014-08-31 DIAGNOSIS — E1165 Type 2 diabetes mellitus with hyperglycemia: Secondary | ICD-10-CM

## 2014-08-31 DIAGNOSIS — E785 Hyperlipidemia, unspecified: Secondary | ICD-10-CM

## 2014-08-31 DIAGNOSIS — E782 Mixed hyperlipidemia: Secondary | ICD-10-CM

## 2014-08-31 DIAGNOSIS — Z79899 Other long term (current) drug therapy: Secondary | ICD-10-CM

## 2014-08-31 LAB — COMPREHENSIVE METABOLIC PANEL
ALT: 15 U/L (ref 0–35)
AST: 14 U/L (ref 0–37)
Albumin: 3.4 g/dL — ABNORMAL LOW (ref 3.5–5.2)
Alkaline Phosphatase: 103 U/L (ref 39–117)
BUN: 26 mg/dL — ABNORMAL HIGH (ref 6–23)
CO2: 27 mEq/L (ref 19–32)
Calcium: 9 mg/dL (ref 8.4–10.5)
Chloride: 104 mEq/L (ref 96–112)
Creat: 1.93 mg/dL — ABNORMAL HIGH (ref 0.50–1.10)
Glucose, Bld: 124 mg/dL — ABNORMAL HIGH (ref 70–99)
Potassium: 4.1 mEq/L (ref 3.5–5.3)
Sodium: 140 mEq/L (ref 135–145)
Total Bilirubin: 0.4 mg/dL (ref 0.2–1.2)
Total Protein: 6.8 g/dL (ref 6.0–8.3)

## 2014-08-31 LAB — HEMOGLOBIN A1C
Hgb A1c MFr Bld: 6.5 % — ABNORMAL HIGH (ref <5.7)
Mean Plasma Glucose: 140 mg/dL — ABNORMAL HIGH (ref <117)

## 2014-08-31 LAB — LIPID PANEL
Cholesterol: 174 mg/dL (ref 0–200)
HDL: 46 mg/dL (ref >39)
LDL Cholesterol: 98 mg/dL (ref 0–99)
Total CHOL/HDL Ratio: 3.8 Ratio
Triglycerides: 150 mg/dL — ABNORMAL HIGH (ref <150)
VLDL: 30 mg/dL (ref 0–40)

## 2014-08-31 LAB — CBC WITH DIFFERENTIAL/PLATELET
Basophils Absolute: 0 10*3/uL (ref 0.0–0.1)
Basophils Relative: 0 % (ref 0–1)
Eosinophils Absolute: 0.3 10*3/uL (ref 0.0–0.7)
Eosinophils Relative: 3 % (ref 0–5)
HCT: 39.5 % (ref 36.0–46.0)
Hemoglobin: 13.1 g/dL (ref 12.0–15.0)
Lymphocytes Relative: 33 % (ref 12–46)
Lymphs Abs: 2.8 10*3/uL (ref 0.7–4.0)
MCH: 29.2 pg (ref 26.0–34.0)
MCHC: 33.2 g/dL (ref 30.0–36.0)
MCV: 88 fL (ref 78.0–100.0)
MPV: 9.9 fL (ref 8.6–12.4)
Monocytes Absolute: 0.8 10*3/uL (ref 0.1–1.0)
Monocytes Relative: 9 % (ref 3–12)
Neutro Abs: 4.6 10*3/uL (ref 1.7–7.7)
Neutrophils Relative %: 55 % (ref 43–77)
Platelets: 313 10*3/uL (ref 150–400)
RBC: 4.49 MIL/uL (ref 3.87–5.11)
RDW: 16.4 % — ABNORMAL HIGH (ref 11.5–15.5)
WBC: 8.4 10*3/uL (ref 4.0–10.5)

## 2014-09-01 ENCOUNTER — Encounter: Payer: Self-pay | Admitting: Family Medicine

## 2014-09-01 ENCOUNTER — Ambulatory Visit (INDEPENDENT_AMBULATORY_CARE_PROVIDER_SITE_OTHER): Admitting: Family Medicine

## 2014-09-01 VITALS — BP 128/78 | HR 76 | Temp 98.4°F | Resp 14 | Ht 62.0 in | Wt 177.0 lb

## 2014-09-01 DIAGNOSIS — E785 Hyperlipidemia, unspecified: Secondary | ICD-10-CM

## 2014-09-01 DIAGNOSIS — L659 Nonscarring hair loss, unspecified: Secondary | ICD-10-CM

## 2014-09-01 DIAGNOSIS — N183 Chronic kidney disease, stage 3 unspecified: Secondary | ICD-10-CM

## 2014-09-01 DIAGNOSIS — E119 Type 2 diabetes mellitus without complications: Secondary | ICD-10-CM

## 2014-09-01 LAB — IRON: Iron: 55 ug/dL (ref 42–145)

## 2014-09-01 LAB — TSH: TSH: 1.588 u[IU]/mL (ref 0.350–4.500)

## 2014-09-01 NOTE — Progress Notes (Signed)
Subjective:    Patient ID: Megan [name], female    DOB: 02-19-1961, 54 y.o.   MRN: [id number]  HPI Patient has a history of hypertension, hyperlipidemia, and type 2 diabetes mellitus. She also has chronic kidney disease. She is here today for follow-up. Her most recent lab work as listed below: Appointment on 08/31/2014  Component Date Value Ref Range Status  . WBC 08/31/2014 8.4  4.0 - 10.5 K/uL Final  . RBC 08/31/2014 4.49  3.87 - 5.11 MIL/uL Final  . Hemoglobin 08/31/2014 13.1  12.0 - 15.0 g/dL Final  . HCT 08/31/2014 39.5  36.0 - 46.0 % Final  . MCV 08/31/2014 88.0  78.0 - 100.0 fL Final  . MCH 08/31/2014 29.2  26.0 - 34.0 pg Final  . MCHC 08/31/2014 33.2  30.0 - 36.0 g/dL Final  . RDW 08/31/2014 16.4* 11.5 - 15.5 % Final  . Platelets 08/31/2014 313  150 - 400 K/uL Final  . MPV 08/31/2014 9.9  8.6 - 12.4 fL Final   ** Please note change in reference range(s). **  . Neutrophils Relative % 08/31/2014 55  43 - 77 % Final  . Neutro Abs 08/31/2014 4.6  1.7 - 7.7 K/uL Final  . Lymphocytes Relative 08/31/2014 33  12 - 46 % Final  . Lymphs Abs 08/31/2014 2.8  0.7 - 4.0 K/uL Final  . Monocytes Relative 08/31/2014 9  3 - 12 % Final  . Monocytes Absolute 08/31/2014 0.8  0.1 - 1.0 K/uL Final  . Eosinophils Relative 08/31/2014 3  0 - 5 % Final  . Eosinophils Absolute 08/31/2014 0.3  0.0 - 0.7 K/uL Final  . Basophils Relative 08/31/2014 0  0 - 1 % Final  . Basophils Absolute 08/31/2014 0.0  0.0 - 0.1 K/uL Final  . Smear Review 08/31/2014 Criteria for review not met   Final  . Sodium 08/31/2014 140  135 - 145 mEq/L Final  . Potassium 08/31/2014 4.1  3.5 - 5.3 mEq/L Final  . Chloride 08/31/2014 104  96 - 112 mEq/L Final  . CO2 08/31/2014 27  19 - 32 mEq/L Final  . Glucose, Bld 08/31/2014 124* 70 - 99 mg/dL Final  . BUN 08/31/2014 26* 6 - 23 mg/dL Final  . Creat 08/31/2014 1.93* 0.50 - 1.10 mg/dL Final  . Total Bilirubin 08/31/2014 0.4  0.2 - 1.2 mg/dL Final  . Alkaline Phosphatase  08/31/2014 103  39 - 117 U/L Final  . AST 08/31/2014 14  0 - 37 U/L Final  . ALT 08/31/2014 15  0 - 35 U/L Final  . Total Protein 08/31/2014 6.8  6.0 - 8.3 g/dL Final  . Albumin 08/31/2014 3.4* 3.5 - 5.2 g/dL Final  . Calcium 08/31/2014 9.0  8.4 - 10.5 mg/dL Final  . Cholesterol 08/31/2014 174  0 - 200 mg/dL Final   Comment: ATP III Classification:       < 200        mg/dL        Desirable      200 - 239     mg/dL        Borderline High      >= 240        mg/dL        High     . Triglycerides 08/31/2014 150* <150 mg/dL Final  . HDL 08/31/2014 46  >39 mg/dL Final  . Total CHOL/HDL Ratio 08/31/2014 3.8   Final  . VLDL 08/31/2014 30  0 - 40 mg/dL  Final  . LDL Cholesterol 08/31/2014 98  0 - 99 mg/dL Final   Comment:   Total Cholesterol/HDL Ratio:CHD Risk                        Coronary Heart Disease Risk Table                                        Men       Women          1/2 Average Risk              3.4        3.3              Average Risk              5.0        4.4           2X Average Risk              9.6        7.1           3X Average Risk             23.4       11.0 Use the calculated Patient Ratio above and the CHD Risk table  to determine the patient's CHD Risk. ATP III Classification (LDL):       < 100        mg/dL         Optimal      100 - 129     mg/dL         Near or Above Optimal      130 - 159     mg/dL         Borderline High      160 - 189     mg/dL         High       > 190        mg/dL         Very High     . Hgb A1c MFr Bld 08/31/2014 6.5* <5.7 % Final   Comment:                                                                        According to the [name] Clinical Practice Recommendations for 2011, when HbA1c is used as a screening test:     >=6.5%   Diagnostic of Diabetes Mellitus            (if abnormal result is confirmed)   5.7-6.4%   Increased risk of developing Diabetes Mellitus   References:Diagnosis and Classification of Diabetes  [place],Diabetes EXNT,7001,74(BSWHQ 1):S62-S69 and Standards of Medical Care in         Diabetes - 2011,Diabetes PRFF,6384,66 (Suppl 1):S11-S61.     . Mean Plasma Glucose 08/31/2014 140* <117 mg/dL Final   Blood pressures well controlled at 128/78. She denies any chest pain shortness of breath or dyspnea on exertion. Hemoglobin A1c is well controlled at 6.5. She denies any polyuria polydipsia or blurred  vision. She is scheduled to see the eye doctor in February. She denies any myalgias or right upper quadrant pain. Currently her LDL cholesterol was well below her goal of 100  She does complain of hair loss and an androgenic pattern Past Medical History  Diagnosis Date  . Mixed hyperlipidemia   . Essential hypertension, benign   . Hemorrhoids   . S/P colonoscopy Jan 2011    Dr. Benson Norway: sessile polyp (benign lymphoid), large hemorrhoids, repeat 5-10 years  . CKD (chronic kidney disease) stage 3, GFR 30-59 ml/min   . GERD (gastroesophageal reflux disease)   . Type 2 diabetes mellitus    Past Surgical History  Procedure Laterality Date  . Abdominal hysterectomy    . Esophagogastroduodenoscopy  09/05/2011    [id number] hiatal hernia; remainder of exam normal. No explanation for patient's abdominal pain with today's examination  . Cholecystectomy  09/29/2011    Procedure: LAPAROSCOPIC CHOLECYSTECTOMY;  Surgeon: Jamesetta So, MD;  Location: [facility];  Service: General;  Laterality: N/A;  . Laparoscopic appendectomy  09/29/2011    Procedure: APPENDECTOMY LAPAROSCOPIC;  Surgeon: Jamesetta So, MD;  Location: [facility];  Service: General;;  incidental appendectomy  . Colonoscopy  Jan 2011    Dr. Benson Norway: sessile polyp (benign lymphoid), large hemorrhoids, repeat 5-10 years  . Esophagogastroduodenoscopy N/A 12/17/2013    Procedure: ESOPHAGOGASTRODUODENOSCOPY (EGD);  Surgeon: Daneil Dolin, MD;  Location: [facility];  Service: Endoscopy;  Laterality: N/A;  9:30   Current Outpatient Prescriptions on  File Prior to Visit  Medication Sig Dispense Refill  . amLODipine (NORVASC) 10 MG tablet Take 1 tablet (10 mg total) by mouth daily. (Patient taking differently: Take 5 mg by mouth daily. ) 90 tablet 3  . aspirin EC 81 MG tablet Take 81 mg by mouth daily.      Marland Kitchen atorvastatin (LIPITOR) 20 MG tablet Take 1 tablet (20 mg total) by mouth daily. 90 tablet 3  . cloNIDine (CATAPRES) 0.2 MG tablet Take 1 tablet (0.2 mg total) by mouth 2 (two) times daily. 180 tablet 2  . losartan (COZAAR) 50 MG tablet Take 1 tablet (50 mg total) by mouth daily. (Patient taking differently: Take 100 mg by mouth daily. ) 90 tablet 3  . nebivolol (BYSTOLIC) 10 MG tablet 1 tab po bid 180 tablet 3  . sitaGLIPtin (JANUVIA) 25 MG tablet Take 1 tablet (25 mg total) by mouth daily. 90 tablet 1   No current facility-administered medications on file prior to visit.   No Known Allergies History   Social History  . Marital Status: Married    Spouse Name: N/A    Number of Children: N/A  . 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 Smoking status: Never Smoker   . Smokeless tobacco: Not on file  . Alcohol Use: No  . Drug Use: No  . Sexual Activity: Yes   Other Topics Concern  . Not on file   Social History Narrative   Works at [employer] in [place].    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   Also has to get items from high shelves-causes achy pain in shoulder area      Married.   Children are grown, out of house.      Review of Systems  All other systems reviewed and are negative.      Objective:   Physical Exam  Constitutional: She appears well-developed and well-nourished.  Neck: Neck supple. No thyromegaly present.  Cardiovascular: Normal rate, regular rhythm and normal heart sounds.   No murmur heard. Pulmonary/Chest: Effort normal and breath sounds normal. No respiratory distress. She has no wheezes. She has no rales.  Abdominal:  Soft. Bowel sounds are normal. She exhibits no distension. There is no tenderness. There is no rebound and no guarding.  Musculoskeletal: She exhibits no edema.  Lymphadenopathy:    She has no cervical adenopathy.  Vitals reviewed.         Assessment & Plan:  Chronic kidney disease, stage 3 (moderate)  HLD (hyperlipidemia)  Diabetes mellitus type II, controlled  Alopecia  Patient's blood pressures well controlled. Her chronic kidney disease is stable. Her diabetes is well controlled along with her cholesterol. I will make no changes in her medication at this time. Her hair loss appears to be an androgenic pattern. Therefore I recommended Rogaine over-the-counter for women. I'll also check a TSH as well as an iron level. The hair loss does not appear to be related to lupus based on the clinical pattern.

## 2014-09-03 ENCOUNTER — Encounter: Payer: Self-pay | Admitting: Family Medicine

## 2014-09-17 ENCOUNTER — Ambulatory Visit: Admitting: Family Medicine

## 2014-09-21 ENCOUNTER — Telehealth: Payer: Self-pay | Admitting: Family Medicine

## 2014-09-21 NOTE — Telephone Encounter (Signed)
Pt called stating that since starting the Rogaine she has been having chest pains and wanting to know what to do about it? Advised pt that she needed to be seen asap however she stated that she is not having any other symptoms - no arm pain or SOB - offered pt an appt for today with MBD but pt declined and an appt was made with Dr. Dennard Schaumann for tomorrow. Pt was informed that if symptoms worsened that she is to seek emergency help asap. Pt verbalizes understanding.

## 2014-09-22 ENCOUNTER — Telehealth: Payer: Self-pay | Admitting: Family Medicine

## 2014-09-22 ENCOUNTER — Emergency Department (HOSPITAL_COMMUNITY)

## 2014-09-22 ENCOUNTER — Emergency Department (HOSPITAL_COMMUNITY)
Admission: EM | Admit: 2014-09-22 | Discharge: 2014-09-22 | Disposition: A | Discharge status: Home / Self Care | Attending: Emergency Medicine | Admitting: Emergency Medicine

## 2014-09-22 ENCOUNTER — Ambulatory Visit (INDEPENDENT_AMBULATORY_CARE_PROVIDER_SITE_OTHER): Admitting: Family Medicine

## 2014-09-22 ENCOUNTER — Encounter (HOSPITAL_COMMUNITY): Payer: Self-pay | Admitting: *Deleted

## 2014-09-22 ENCOUNTER — Encounter: Payer: Self-pay | Admitting: Family Medicine

## 2014-09-22 VITALS — BP 164/96 | HR 72 | Temp 97.9°F | Resp 16 | Ht 62.0 in | Wt 178.0 lb

## 2014-09-22 DIAGNOSIS — E119 Type 2 diabetes mellitus without complications: Secondary | ICD-10-CM | POA: Diagnosis not present

## 2014-09-22 DIAGNOSIS — R0789 Other chest pain: Secondary | ICD-10-CM | POA: Diagnosis not present

## 2014-09-22 DIAGNOSIS — Z7982 Long term (current) use of aspirin: Secondary | ICD-10-CM | POA: Diagnosis not present

## 2014-09-22 DIAGNOSIS — E876 Hypokalemia: Secondary | ICD-10-CM | POA: Diagnosis not present

## 2014-09-22 DIAGNOSIS — N289 Disorder of kidney and ureter, unspecified: Secondary | ICD-10-CM | POA: Diagnosis not present

## 2014-09-22 DIAGNOSIS — Z8719 Personal history of other diseases of the digestive system: Secondary | ICD-10-CM | POA: Diagnosis not present

## 2014-09-22 DIAGNOSIS — R079 Chest pain, unspecified: Secondary | ICD-10-CM | POA: Diagnosis present

## 2014-09-22 DIAGNOSIS — I129 Hypertensive chronic kidney disease with stage 1 through stage 4 chronic kidney disease, or unspecified chronic kidney disease: Secondary | ICD-10-CM | POA: Diagnosis not present

## 2014-09-22 DIAGNOSIS — N183 Chronic kidney disease, stage 3 (moderate): Secondary | ICD-10-CM | POA: Insufficient documentation

## 2014-09-22 DIAGNOSIS — K219 Gastro-esophageal reflux disease without esophagitis: Secondary | ICD-10-CM | POA: Diagnosis not present

## 2014-09-22 DIAGNOSIS — Z79899 Other long term (current) drug therapy: Secondary | ICD-10-CM | POA: Insufficient documentation

## 2014-09-22 DIAGNOSIS — E782 Mixed hyperlipidemia: Secondary | ICD-10-CM | POA: Insufficient documentation

## 2014-09-22 LAB — CBC
HCT: 37.1 % (ref 36.0–46.0)
Hemoglobin: 12.3 g/dL (ref 12.0–15.0)
MCH: 29.4 pg (ref 26.0–34.0)
MCHC: 33.2 g/dL (ref 30.0–36.0)
MCV: 88.8 fL (ref 78.0–100.0)
Platelets: 289 10*3/uL (ref 150–400)
RBC: 4.18 MIL/uL (ref 3.87–5.11)
RDW: 15.3 % (ref 11.5–15.5)
WBC: 8.1 10*3/uL (ref 4.0–10.5)

## 2014-09-22 LAB — BASIC METABOLIC PANEL
Anion gap: 4 — ABNORMAL LOW (ref 5–15)
BUN: 24 mg/dL — ABNORMAL HIGH (ref 6–23)
CO2: 25 mmol/L (ref 19–32)
Calcium: 8.3 mg/dL — ABNORMAL LOW (ref 8.4–10.5)
Chloride: 113 mmol/L — ABNORMAL HIGH (ref 96–112)
Creatinine, Ser: 2.03 mg/dL — ABNORMAL HIGH (ref 0.50–1.10)
GFR calc Af Amer: 31 mL/min — ABNORMAL LOW (ref >90)
GFR calc non Af Amer: 27 mL/min — ABNORMAL LOW (ref >90)
Glucose, Bld: 116 mg/dL — ABNORMAL HIGH (ref 70–99)
Potassium: 3.3 mmol/L — ABNORMAL LOW (ref 3.5–5.1)
Sodium: 142 mmol/L (ref 135–145)

## 2014-09-22 LAB — BRAIN NATRIURETIC PEPTIDE: B Natriuretic Peptide: 41 pg/mL (ref 0.0–100.0)

## 2014-09-22 LAB — TROPONIN I
Troponin I: <0.03 ng/mL (ref <0.031)
Troponin I: <0.03 ng/mL (ref <0.031)

## 2014-09-22 LAB — D-DIMER, QUANTITATIVE: D-Dimer, Quant: 0.5 ug/mL-FEU — ABNORMAL HIGH (ref 0.00–0.48)

## 2014-09-22 MED ORDER — ASPIRIN 81 MG PO CHEW
324.0000 mg | CHEWABLE_TABLET | Freq: Once | ORAL | Status: AC
  Administered 2014-09-22: 324 mg via ORAL
  Filled 2014-09-22: qty 4

## 2014-09-22 MED ORDER — HYDRALAZINE HCL 25 MG PO TABS: 25.0000 mg | ORAL_TABLET | Freq: Three times a day (TID) | ORAL | Status: DC

## 2014-09-22 MED ORDER — TRAMADOL HCL 50 MG PO TABS
50.0000 mg | ORAL_TABLET | Freq: Four times a day (QID) | ORAL | Status: DC | PRN
Start: 2014-09-22 — End: 2015-05-26

## 2014-09-22 MED ORDER — KETOROLAC TROMETHAMINE 30 MG/ML IJ SOLN
30.0000 mg | Freq: Once | INTRAMUSCULAR | Status: AC
  Administered 2014-09-22: 30 mg via INTRAVENOUS
  Filled 2014-09-22: qty 1

## 2014-09-22 MED ORDER — NITROGLYCERIN 2 % TD OINT
1.0000 [in_us] | TOPICAL_OINTMENT | Freq: Once | TRANSDERMAL | Status: AC
  Administered 2014-09-22: 1 [in_us] via TOPICAL
  Filled 2014-09-22: qty 1

## 2014-09-22 NOTE — Progress Notes (Signed)
Subjective:    Patient ID: Megan [name], female    DOB: [birth year], 54 y.o.   MRN: [id number]  HPI  patient presents today with chest pain. She was seen in emergency room earlier this morning. In the emergency room her d-dimer was slightly elevated at 0.5. Patient had 2 sets of troponins which were negative. I reviewed the patient's chest x-ray. It shows hyperinflation. It also shows mild vascular congestion. EKG reveals T-wave inversion in aVL which is chronic. There is also some T-wave inversions in lead V5 and V6. The one in V6 may be new. The patient had a stress echocardiogram performed  In January 2015 it was completely normal. Patient's blood pressure last few days has been extremely elevated 164/95-96. She denies any shortness of breath. She does report pleurisy. The pain is located on the right side of the sternum. There is no radiation of the pain. She denies any hemoptysis.   Patient has similar pain last year. She underwent EGD which revealed mild chronic inflammation but no significant findings on EGD. Past Medical History  Diagnosis Date  . Mixed hyperlipidemia   . Essential hypertension, benign   . Hemorrhoids   . S/P colonoscopy Jan 2011    Dr. Benson Norway: sessile polyp (benign lymphoid), large hemorrhoids, repeat 5-10 years  . CKD (chronic kidney disease) stage 3, GFR 30-59 ml/min   . GERD (gastroesophageal reflux disease)   . Type 2 diabetes mellitus    Past Surgical History  Procedure Laterality Date  . Abdominal hysterectomy    . Esophagogastroduodenoscopy  09/05/2011    [id number] hiatal hernia; remainder of exam normal. No explanation for patient's abdominal pain with today's examination  . Cholecystectomy  09/29/2011    Procedure: LAPAROSCOPIC CHOLECYSTECTOMY;  Surgeon: Jamesetta So, MD;  Location: [facility];  Service: General;  Laterality: N/A;  . Laparoscopic appendectomy  09/29/2011    Procedure: APPENDECTOMY LAPAROSCOPIC;  Surgeon: Jamesetta So, MD;  Location: [facility];  Service: General;;  incidental appendectomy  . Colonoscopy  Jan 2011    Dr. Benson Norway: sessile polyp (benign lymphoid), large hemorrhoids, repeat 5-10 years  . Esophagogastroduodenoscopy N/A 12/17/2013    Procedure: ESOPHAGOGASTRODUODENOSCOPY (EGD);  Surgeon: Daneil Dolin, MD;  Location: [facility];  Service: Endoscopy;  Laterality: N/A;  9:30   Current Outpatient Prescriptions on File Prior to Visit  Medication Sig Dispense Refill  . amLODipine (NORVASC) 10 MG tablet Take 1 tablet (10 mg total) by mouth daily. (Patient taking differently: Take 5 mg by mouth daily. ) 90 tablet 3  . aspirin EC 81 MG tablet Take 81 mg by mouth daily.      Marland Kitchen atorvastatin (LIPITOR) 20 MG tablet Take 1 tablet (20 mg total) by mouth daily. 90 tablet 3  . cloNIDine (CATAPRES) 0.2 MG tablet Take 1 tablet (0.2 mg total) by mouth 2 (two) times daily. 180 tablet 2  . losartan (COZAAR) 50 MG tablet Take 1 tablet (50 mg total) by mouth daily. (Patient taking differently: Take 100 mg by mouth daily. ) 90 tablet 3  . nebivolol (BYSTOLIC) 10 MG tablet 1 tab po bid (Patient taking differently: Take 10 mg by mouth 2 (two) times daily. ) 180 tablet 3  . sitaGLIPtin (JANUVIA) 25 MG tablet Take 1 tablet (25 mg total) by mouth daily. (Patient not taking: Reported on 09/22/2014) 90 tablet 1   No current facility-administered medications on file prior to visit.   No Known Allergies History   Social History  .  Marital Status: Married    Spouse Name: N/A    Number of Children: N/A  . 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 Smoking status: Never Smoker   . Smokeless tobacco: Not on file  . Alcohol Use: No  . Drug Use: No  . Sexual Activity: Yes   Other Topics Concern  . Not on file   Social History Narrative   Works at [employer] in [place].    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   Also has to get items from high shelves-causes achy pain in  shoulder area      Married.   Children are grown, out of house.      Review of Systems  All other systems reviewed and are negative.      Objective:   Physical Exam  Constitutional: She appears well-developed and well-nourished.  Neck: Neck supple.  Cardiovascular: Normal rate, regular rhythm, normal heart sounds and intact distal pulses.   No murmur heard. Pulmonary/Chest: Effort normal and breath sounds normal. No respiratory distress. She has no wheezes. She has no rales. She exhibits tenderness.  Abdominal: Soft. Bowel sounds are normal. She exhibits no distension and no mass. There is no tenderness. There is no rebound and no guarding.  Musculoskeletal: She exhibits no edema.  Vitals reviewed.         Assessment & Plan:  Other chest pain - Plan: hydrALAZINE (APRESOLINE) 25 MG tablet   I believe the patient's chest pain is likely costochondritis although her symptoms are somewhat irregular for that. I am concerned by the patient's elevated blood pressure. I cannot give the patient NSAIDS due to her chronic renal insufficiency.   Furthermore her blood pressure is too high to start the patient on prednisone at the present time.   I would like to start the patient on hydralazine as an afterload reducer to help lower her blood pressure. She is to take 25 mg by mouth 3 times a day. I would like to recheck the patient in 48 hours. If the patient's blood pressure is better and she continues to have chest pain at that time I will start the patient on prednisone for possible costochondritis. If the pain worsens or she develops shortness of breath on her to return to the emergency room.

## 2014-09-22 NOTE — Telephone Encounter (Signed)
Yes pt needs to keep her appt and she is aware

## 2014-09-22 NOTE — ED Provider Notes (Signed)
CSN: [id number]     Arrival date & time 09/22/14  0504 History   First MD Initiated Contact with Patient 09/22/14 [phone]     Chief Complaint  Patient presents with  . Chest Pain     (Consider location/radiation/quality/duration/timing/severity/associated sxs/prior Treatment) HPI  Patient reports she started getting central chest pain about 3 weeks ago. The pain comes and goes and lasts a few minutes when she has it. She can only describe the pain is "pain". She states sometimes it sharp but not always. She has over 10 episodes every day. She states she has shortness of breath with the pain. She does not describe dyspnea on exertion. She states yesterday the pain started to be located in her right chest. She states it woke her up at 3 AM this morning and the pain has been there constantly since then. She states today is the first time the pain has been this constant. She states coughing and taking big deep breaths makes the pain hurt more. Nothing makes it feel better. She denies any fever or sputum production. She denies nausea, vomiting, or swelling in her legs. She denies any recent prolonged sitting or traveling. She states she has had this chest pain before however she does not know what was wrong with her. Patient was seen in the ED in October 2014 for chest pain. Patient took an aspirin 81 mg at 4 AM.  Family history mother died at age 59 from coronary artery disease. Her sister is 57 and still living and has a defibrillator.   PCP Dr Dennard Schaumann  Past Medical History  Diagnosis Date  . Mixed hyperlipidemia   . Essential hypertension, benign   . Hemorrhoids   . S/P colonoscopy Jan 2011    Dr. Benson Norway: sessile polyp (benign lymphoid), large hemorrhoids, repeat 5-10 years  . CKD (chronic kidney disease) stage 3, GFR 30-59 ml/min   . GERD (gastroesophageal reflux disease)   . Type 2 diabetes mellitus    Past Surgical History  Procedure Laterality Date  . Abdominal hysterectomy    .  Esophagogastroduodenoscopy  09/05/2011    [id number] hiatal hernia; remainder of exam normal. No explanation for patient's abdominal pain with today's examination  . Cholecystectomy  09/29/2011    Procedure: LAPAROSCOPIC CHOLECYSTECTOMY;  Surgeon: Jamesetta So, MD;  Location: [facility];  Service: General;  Laterality: N/A;  . Laparoscopic appendectomy  09/29/2011    Procedure: APPENDECTOMY LAPAROSCOPIC;  Surgeon: Jamesetta So, MD;  Location: [facility];  Service: General;;  incidental appendectomy  . Colonoscopy  Jan 2011    Dr. Benson Norway: sessile polyp (benign lymphoid), large hemorrhoids, repeat 5-10 years  . Esophagogastroduodenoscopy N/A 12/17/2013    Procedure: ESOPHAGOGASTRODUODENOSCOPY (EGD);  Surgeon: Daneil Dolin, MD;  Location: [facility];  Service: Endoscopy;  Laterality: N/A;  9:30   Family History  Problem Relation Age of Onset  . Colon cancer Neg Hx   . Hypertension Mother   . Coronary artery disease Mother   . Diabetes Mother   . Hypertension Sister   . Coronary artery disease Sister   . Hypertension Brother    History  Substance Use Topics  . Smoking status: Never Smoker   . Smokeless tobacco: Not on file  . Alcohol Use: No   Lives with spouse Employed    OB History    No data available     Review of Systems  All other systems reviewed and are negative.     Allergies  Review of  patient's allergies indicates no known allergies.  Home Medications   Prior to Admission medications   Medication Sig Start Date End Date Taking? Authorizing Provider  amLODipine (NORVASC) 10 MG tablet Take 1 tablet (10 mg total) by mouth daily. Patient taking differently: Take 5 mg by mouth daily.  12/18/13   [name], MD  aspirin EC 81 MG tablet Take 81 mg by mouth daily.      Historical Provider, MD  atorvastatin (LIPITOR) 20 MG tablet Take 1 tablet (20 mg total) by mouth daily. 12/18/13   [name], MD  cloNIDine (CATAPRES) 0.2 MG tablet Take 1 tablet (0.2 mg total) by  mouth 2 (two) times daily. 04/11/14   [name], MD  losartan (COZAAR) 50 MG tablet Take 1 tablet (50 mg total) by mouth daily. Patient taking differently: Take 100 mg by mouth daily.  12/18/13   [name], MD  nebivolol (BYSTOLIC) 10 MG tablet 1 tab po bid 03/17/14   [name], MD  sitaGLIPtin (JANUVIA) 25 MG tablet Take 1 tablet (25 mg total) by mouth daily. 03/19/14   [name], MD   BP 127/102 mmHg  Pulse 62  Temp(Src) 97.5 F (36.4 C) (Oral)  Resp 11  Ht 5\' 2"  (1.575 m)  Wt 178 lb (80.74 kg)  BMI 32.55 kg/m2  SpO2 100%  Vital signs normal   Physical Exam  Constitutional: She is oriented to person, place, and time. She appears well-developed and well-nourished.  Non-toxic appearance. She does not appear ill. No distress.  HENT:  Head: Normocephalic and atraumatic.  Right Ear: External ear normal.  Left Ear: External ear normal.  Nose: Nose normal. No mucosal edema or rhinorrhea.  Mouth/Throat: Oropharynx is clear and moist and mucous membranes are normal. No dental abscesses or uvula swelling.  Eyes: Conjunctivae and EOM are normal. Pupils are equal, round, and reactive to light.  Neck: Normal range of motion and full passive range of motion without pain. Neck supple.  Cardiovascular: Normal rate, regular rhythm and normal heart sounds.  Exam reveals no gallop and no friction rub.   No murmur heard. Pulmonary/Chest: Effort normal and breath sounds normal. No respiratory distress. She has no wheezes. She has no rhonchi. She has no rales. She exhibits no tenderness and no crepitus.    Patient's chest is nontender.  Area of pain noted  Abdominal: Soft. Normal appearance and bowel sounds are normal. She exhibits no distension. There is no tenderness. There is no rebound and no guarding.  Musculoskeletal: Normal range of motion. She exhibits no edema or tenderness.  Moves all extremities well.   Neurological: She is alert and oriented to person, place, and  time. She has normal strength. No cranial nerve deficit.  Skin: Skin is warm, dry and intact. No rash noted. No erythema. No pallor.  Psychiatric: She has a normal mood and affect. Her speech is normal and behavior is normal. Her mood appears not anxious.  Nursing note and vitals reviewed.   ED Course  Procedures (including critical care time)  Medications  aspirin chewable tablet 324 mg (324 mg Oral Given 09/22/14 0612)  nitroGLYCERIN (NITROGLYN) 2 % ointment 1 inch (1 inch Topical Given 09/22/14 0613)  ketorolac (TORADOL) 30 MG/ML injection 30 mg (30 mg Intravenous Given 09/22/14 0612)    Patient was given Toradol before knowing she has chronic renal insufficiency. No more nonsteroidal anti-inflammatories will be given.  7:20 AM patient given her laboratory results that are back  so far. She will be staying in the emergency department and getting a second troponin done at 8 AM. We discussed following up with a cardiologist even if her troponin is negative due to her family history of heart disease. She is agreeable. We discussed her renal insufficiency which she was aware of. She is going to follow-up with Dr. Dennard Schaumann about that. She was not given potassium because of her renal insufficiency.  Dr. Tawanna Sat will recheck her second troponin and do patient's disposition.   Labs Review Results for orders placed or performed during the hospital encounter of [id number]  Basic metabolic panel  Result Value Ref Range   Sodium 142 135 - 145 mmol/L   Potassium 3.3 (L) 3.5 - 5.1 mmol/L   Chloride 113 (H) 96 - 112 mmol/L   CO2 25 19 - 32 mmol/L   Glucose, Bld 116 (H) 70 - 99 mg/dL   BUN 24 (H) 6 - 23 mg/dL   Creatinine, Ser 2.03 (H) 0.50 - 1.10 mg/dL   Calcium 8.3 (L) 8.4 - 10.5 mg/dL   GFR calc non Af Amer 27 (L) >90 mL/min   GFR calc Af Amer 31 (L) >90 mL/min   Anion gap 4 (L) 5 - 15  CBC  Result Value Ref Range   WBC 8.1 4.0 - 10.5 K/uL   RBC 4.18 3.87 - 5.11 MIL/uL   Hemoglobin 12.3 12.0 -  15.0 g/dL   HCT 37.1 36.0 - 46.0 %   MCV 88.8 78.0 - 100.0 fL   MCH 29.4 26.0 - 34.0 pg   MCHC 33.2 30.0 - 36.0 g/dL   RDW 15.3 11.5 - 15.5 %   Platelets 289 150 - 400 K/uL  Troponin I (MHP)  Result Value Ref Range   Troponin I <0.03 <0.031 ng/mL   Laboratory interpretation all normal except hypokalemia, mild worsening of her underlying renal insufficiency     Imaging Review Dg Chest Port 1 View  09/22/2014   CLINICAL DATA:  Subacute onset of mid chest pain, radiating to the right. Pain on deep breathing. Initial encounter.  EXAM: PORTABLE CHEST - 1 VIEW  COMPARISON:  Chest radiograph performed 05/14/2013  FINDINGS: The lungs are mildly hypoexpanded. Mild vascular congestion is noted. Minimal bibasilar atelectasis is seen. There is no evidence of pleural effusion or pneumothorax.  The cardiomediastinal silhouette is borderline normal in size. No acute osseous abnormalities are seen.  IMPRESSION: Lungs mildly hypoexpanded, with minimal bibasilar atelectasis. Mild vascular congestion noted.   Electronically Signed   By: Garald Balding M.D.   On: 09/22/2014 06:53     EKG Interpretation None        Date: 09/22/2014  Rate: 67  Rhythm: normal sinus rhythm  QRS Axis: right  Intervals: QT prolonged  ST/T Wave abnormalities: nonspecific T wave changes  Conduction Disutrbances:LVH  Narrative Interpretation: EKG has the appearance on HTN  Old EKG Reviewed: none available    MDM   Final diagnoses:  Atypical chest pain  Renal insufficiency  Hypokalemia    Disposition pending   Rolland Porter, MD, Abram Sander     Janice Norrie, MD 09/22/14 [phone]

## 2014-09-22 NOTE — Telephone Encounter (Signed)
Patient calling to say she went to er and would like you to review the notes and see if she still needs to come in for appt  [phone]

## 2014-09-22 NOTE — ED Notes (Signed)
Pt c/o mid chest pain that started x 3 weeks ago and has now radiated to right side of chest; pt states it hurts most when she takes a deep breath

## 2014-09-22 NOTE — ED Provider Notes (Signed)
Patient signed out to me to follow-up on repeat labs. Patient was seen overnight for evaluation of chest pain. Patient has been having the pain for 3 weeks. Patient reports that the pain has been intermittent until yesterday at which time she started having more constant pain. The pain makes her feel short of breath when it is present, but she has not had any dyspnea on exertion. She has noticed pain with coughing and movement. Pain is very atypical for cardiac chest pain. Second troponin is once again negative.  Patient did have a d-dimer performed which was 0.50. Cutoff for normal is 0.48. She is not short of breath currently. There has not been any tachycardia. She is not hypoxic. She does not have any unilateral leg pain or swelling. Suspicion for PE is extremely low. Despite the d-dimer being very slightly over normal limit, I do not feel that the patient requires CT angiography evaluation for PE at this time. Patient will be discharged and treated with analgesia for atypical chest pain, follow-up with her doctor in the office.  Orpah Greek, MD 09/22/14 [phone]

## 2014-09-22 NOTE — Discharge Instructions (Signed)

## 2014-09-24 ENCOUNTER — Ambulatory Visit: Admitting: Family Medicine

## 2014-09-25 ENCOUNTER — Ambulatory Visit (INDEPENDENT_AMBULATORY_CARE_PROVIDER_SITE_OTHER): Admitting: Family Medicine

## 2014-09-25 ENCOUNTER — Encounter: Payer: Self-pay | Admitting: Family Medicine

## 2014-09-25 VITALS — BP 138/90 | HR 68 | Temp 97.9°F | Resp 18 | Ht 62.0 in | Wt 178.0 lb

## 2014-09-25 DIAGNOSIS — R0789 Other chest pain: Secondary | ICD-10-CM

## 2014-09-25 NOTE — Progress Notes (Signed)
Subjective:    Patient ID: Megan [name], female    DOB: 06-18-1961, 54 y.o.   MRN: [id number]  HPI 09/22/14 Patient presents today with chest pain. She was seen in emergency room earlier this morning. In the emergency room her d-dimer was slightly elevated at 0.5. Patient had 2 sets of troponins which were negative. I reviewed the patient's chest x-ray. It shows hyperinflation. It also shows mild vascular congestion. EKG reveals T-wave inversion in aVL which is chronic. There is also some T-wave inversions in lead V5 and V6. The one in V6 may be new. The patient had a stress echocardiogram performed  In January 2015 it was completely normal. Patient's blood pressure last few days has been extremely elevated 164/95-96. She denies any shortness of breath. She does report pleurisy. The pain is located on the right side of the sternum. There is no radiation of the pain. She denies any hemoptysis.   Patient has similar pain last year. She underwent EGD which revealed mild chronic inflammation but no significant findings on EGD.  At that time, my plan was:  I believe the patient's chest pain is likely costochondritis although her symptoms are somewhat irregular for that. I am concerned by the patient's elevated blood pressure. I cannot give the patient NSAIDS due to her chronic renal insufficiency.   Furthermore her blood pressure is too high to start the patient on prednisone at the present time.   I would like to start the patient on hydralazine as an afterload reducer to help lower her blood pressure. She is to take 25 mg by mouth 3 times a day. I would like to recheck the patient in 48 hours. If the patient's blood pressure is better and she continues to have chest pain at that time I will start the patient on prednisone for possible costochondritis. If the pain worsens or she develops shortness of breath on her to return to the emergency room.  09/25/14 Patient's blood pressure is much better since  the addition of hydralazine. She also states that her chest pain is slightly better. Her blood pressure at home is even better than is here today. She continues to have right-sided substernal chest discomfort. My initial impression was costochondritis. EKG obtained today in the office reveals normal sinus rhythm. T-wave inversions in V5 and V6 have now mitigated. It is now mild nonspecific changes. Past Medical History  Diagnosis Date  . Mixed hyperlipidemia   . Essential hypertension, benign   . Hemorrhoids   . S/P colonoscopy Jan 2011    Dr. Benson Norway: sessile polyp (benign lymphoid), large hemorrhoids, repeat 5-10 years  . CKD (chronic kidney disease) stage 3, GFR 30-59 ml/min   . GERD (gastroesophageal reflux disease)   . Type 2 diabetes mellitus    Past Surgical History  Procedure Laterality Date  . Abdominal hysterectomy    . Esophagogastroduodenoscopy  09/05/2011    [id number] hiatal hernia; remainder of exam normal. No explanation for patient's abdominal pain with today's examination  . Cholecystectomy  09/29/2011    Procedure: LAPAROSCOPIC CHOLECYSTECTOMY;  Surgeon: Jamesetta So, MD;  Location: [facility];  Service: General;  Laterality: N/A;  . Laparoscopic appendectomy  09/29/2011    Procedure: APPENDECTOMY LAPAROSCOPIC;  Surgeon: Jamesetta So, MD;  Location: [facility];  Service: General;;  incidental appendectomy  . Colonoscopy  Jan 2011    Dr. Benson Norway: sessile polyp (benign lymphoid), large hemorrhoids, repeat 5-10 years  . Esophagogastroduodenoscopy N/A 12/17/2013    Procedure: ESOPHAGOGASTRODUODENOSCOPY (  EGD);  Surgeon: Daneil Dolin, MD;  Location: [facility];  Service: Endoscopy;  Laterality: N/A;  9:30   Current Outpatient Prescriptions on File Prior to Visit  Medication Sig Dispense Refill  . aspirin EC 81 MG tablet Take 81 mg by mouth daily.      Marland Kitchen atorvastatin (LIPITOR) 20 MG tablet Take 1 tablet (20 mg total) by mouth daily. 90 tablet 3  . cloNIDine (CATAPRES) 0.2 MG tablet  Take 1 tablet (0.2 mg total) by mouth 2 (two) times daily. 180 tablet 2  . glipiZIDE (GLUCOTROL XL) 5 MG 24 hr tablet Take 5 mg by mouth daily with breakfast.    . hydrALAZINE (APRESOLINE) 25 MG tablet Take 1 tablet (25 mg total) by mouth 3 (three) times daily. 90 tablet 0  . nebivolol (BYSTOLIC) 10 MG tablet 1 tab po bid (Patient taking differently: Take 10 mg by mouth 2 (two) times daily. ) 180 tablet 3  . sitaGLIPtin (JANUVIA) 25 MG tablet Take 1 tablet (25 mg total) by mouth daily. 90 tablet 1  . traMADol (ULTRAM) 50 MG tablet Take 1 tablet (50 mg total) by mouth every 6 (six) hours as needed. 15 tablet 0   No current facility-administered medications on file prior to visit.   No Known Allergies History   Social History  . Marital Status: Married    Spouse Name: N/A  . Number of Children: N/A  . 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 Smoking status: Never Smoker   . Smokeless tobacco: Not on file  . Alcohol Use: No  . Drug Use: No  . Sexual Activity: Yes   Other Topics Concern  . Not on file   Social History Narrative   Works at [employer] in [place].    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   Also has to get items from high shelves-causes achy pain in shoulder area      Married.   Children are grown, out of house.      Review of Systems  All other systems reviewed and are negative.      Objective:   Physical Exam  Constitutional: She appears well-developed and well-nourished.  Neck: Neck supple.  Cardiovascular: Normal rate, regular rhythm, normal heart sounds and intact distal pulses.   No murmur heard. Pulmonary/Chest: Effort normal and breath sounds normal. No respiratory distress. She has no wheezes. She has no rales. She exhibits tenderness.  Abdominal: Soft. Bowel sounds are normal. She exhibits no distension and no mass. There is no tenderness. There is no rebound and no guarding.    Musculoskeletal: She exhibits no edema.  Vitals reviewed.         Assessment & Plan:  Other chest pain  EKG now appears back to her previous baseline. I do not feel this is cardiac chest pain. I believe it is musculoskeletal chest pain. I would like the patient to continue hydralazine to control her blood pressure. I will treat this like costochondritis. I anticipate spontaneous self limited resolution over the next 1-2 weeks. If pain persists I would get the patient back in to see the cardiologist. If pain worsens I will get her into see the cardiologist immediately. The present time I just reassured the patient that I believe this is most likely musculoskeletal chest pain.

## 2014-10-14 ENCOUNTER — Telehealth: Payer: Self-pay | Admitting: Internal Medicine

## 2014-10-14 ENCOUNTER — Telehealth: Payer: Self-pay | Admitting: Family Medicine

## 2014-10-14 DIAGNOSIS — K649 Unspecified hemorrhoids: Secondary | ICD-10-CM

## 2014-10-14 NOTE — Telephone Encounter (Signed)
Pt called back. She had went to her pcp and he has referred her to see Dr.Jenkins and she has an appt with him in the AM.

## 2014-10-14 NOTE — Telephone Encounter (Signed)
Tried to call pt- LMOM 

## 2014-10-14 NOTE — Telephone Encounter (Signed)
Hemorrhoids are bothersome and bleeding again.  Want to renew referral to surgeon in [place].  Saw a few years ago but did not do surgery because of BP issues.  Feels it is time she had them taken care of.

## 2014-10-14 NOTE — Telephone Encounter (Signed)
Called back [place] she saw Dr Arnoldo Morale.  I put referral in.

## 2014-10-14 NOTE — Telephone Encounter (Signed)
PATIENT CALLED NEEDING TO SEE SOMEONE ASAP. HEMORRHOIDS ARE BLEEDING BAD  PLEASE ADVISE [phone]

## 2014-10-15 NOTE — Telephone Encounter (Signed)
ok 

## 2014-11-06 ENCOUNTER — Other Ambulatory Visit: Payer: Self-pay | Admitting: Family Medicine

## 2014-11-06 NOTE — Telephone Encounter (Signed)
Refill appropriate and filled per protocol. 

## 2014-11-14 ENCOUNTER — Other Ambulatory Visit: Payer: Self-pay | Admitting: Family Medicine

## 2014-11-20 ENCOUNTER — Telehealth: Payer: Self-pay | Admitting: Family Medicine

## 2014-11-20 MED ORDER — GUAIFENESIN-CODEINE 100-10 MG/5ML PO SOLN: 5.0000 mL | Freq: Three times a day (TID) | ORAL | Status: DC | PRN

## 2014-11-20 NOTE — Telephone Encounter (Signed)
Tried to call patient.  Left message about Rx called in

## 2014-11-20 NOTE — Telephone Encounter (Signed)
Can patient take Robitussin with codeine 1 teaspoon every 8 hours as needed for cough

## 2014-11-20 NOTE — Telephone Encounter (Signed)
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 We do not have anything on the schedule available with any provider could we please call the pt in something for her symptoms.

## 2014-11-25 ENCOUNTER — Telehealth: Payer: Self-pay | Admitting: *Deleted

## 2014-11-25 MED ORDER — HYDROCODONE-HOMATROPINE 5-1.5 MG/5ML PO SYRP: 5.0000 mL | ORAL_SOLUTION | Freq: Four times a day (QID) | ORAL | Status: DC | PRN

## 2014-11-25 NOTE — Telephone Encounter (Signed)
Pt called stating that she was prescribe Robitussin with Codeine in it on Monday April 8 she has ran out and states that it is not touching the cough she still has the cough and worse. Can you call something in stronger for her?  [facility]

## 2014-11-25 NOTE — Telephone Encounter (Signed)
Hycodan 1 tsp po q 8 hrs prn cough

## 2014-11-25 NOTE — Telephone Encounter (Signed)
RX printed, left up front and patient aware to pick up  

## 2014-11-29 ENCOUNTER — Other Ambulatory Visit: Payer: Self-pay | Admitting: Family Medicine

## 2014-11-30 ENCOUNTER — Ambulatory Visit (HOSPITAL_COMMUNITY)
Admission: RE | Admit: 2014-11-30 | Discharge: 2014-11-30 | Disposition: A | Discharge status: Home / Self Care | Source: Ambulatory Visit | Attending: Family Medicine | Admitting: Family Medicine

## 2014-11-30 ENCOUNTER — Ambulatory Visit (INDEPENDENT_AMBULATORY_CARE_PROVIDER_SITE_OTHER): Admitting: Family Medicine

## 2014-11-30 ENCOUNTER — Encounter: Payer: Self-pay | Admitting: Family Medicine

## 2014-11-30 ENCOUNTER — Telehealth: Payer: Self-pay | Admitting: Family Medicine

## 2014-11-30 VITALS — BP 120/74 | HR 78 | Temp 98.0°F | Resp 18 | Ht 62.0 in | Wt 172.0 lb

## 2014-11-30 DIAGNOSIS — R05 Cough: Secondary | ICD-10-CM | POA: Diagnosis not present

## 2014-11-30 DIAGNOSIS — R059 Cough, unspecified: Secondary | ICD-10-CM

## 2014-11-30 DIAGNOSIS — R0602 Shortness of breath: Secondary | ICD-10-CM | POA: Diagnosis not present

## 2014-11-30 NOTE — Progress Notes (Signed)
Subjective:    Patient ID: Megan [name], female    DOB: [birth year], 54 y.o.   MRN: [id number]  HPI Patient symptoms began around April 7. She has had a dry nonproductive cough since that time. She denies any fever. She denies any chills. She denies any shortness of breath. She does report posttussive emesis. She states the cough is severe at times. It keeps her awake at night. She is tried Robitussin with codeine and even hiking and without relief. She denies any pleurisy. She denies any dyspnea on exertion. On examination today her exam is completely normal except for bilateral nasal turbinate swelling and nasal mucosal swelling Past Medical History  Diagnosis Date  . Mixed hyperlipidemia   . Essential hypertension, benign   . Hemorrhoids   . S/P colonoscopy Jan 2011    Dr. Benson Norway: sessile polyp (benign lymphoid), large hemorrhoids, repeat 5-10 years  . CKD (chronic kidney disease) stage 3, GFR 30-59 ml/min   . GERD (gastroesophageal reflux disease)   . Type 2 diabetes mellitus    Past Surgical History  Procedure Laterality Date  . Abdominal hysterectomy    . Esophagogastroduodenoscopy  09/05/2011    [id number] hiatal hernia; remainder of exam normal. No explanation for patient's abdominal pain with today's examination  . Cholecystectomy  09/29/2011    Procedure: LAPAROSCOPIC CHOLECYSTECTOMY;  Surgeon: Jamesetta So, MD;  Location: [facility];  Service: General;  Laterality: N/A;  . Laparoscopic appendectomy  09/29/2011    Procedure: APPENDECTOMY LAPAROSCOPIC;  Surgeon: Jamesetta So, MD;  Location: [facility];  Service: General;;  incidental appendectomy  . Colonoscopy  Jan 2011    Dr. Benson Norway: sessile polyp (benign lymphoid), large hemorrhoids, repeat 5-10 years  . Esophagogastroduodenoscopy N/A 12/17/2013    Procedure: ESOPHAGOGASTRODUODENOSCOPY (EGD);  Surgeon: Daneil Dolin, MD;  Location: [facility];  Service: Endoscopy;  Laterality: N/A;  9:30   Current Outpatient Prescriptions on  File Prior to Visit  Medication Sig Dispense Refill  . amLODipine (NORVASC) 10 MG tablet TAKE 1 TABLET DAILY 90 tablet 2  . aspirin EC 81 MG tablet Take 81 mg by mouth daily.      Marland Kitchen atorvastatin (LIPITOR) 20 MG tablet TAKE 1 TABLET DAILY 90 tablet 2  . cloNIDine (CATAPRES) 0.2 MG tablet TAKE 1 TABLET TWICE A DAY 180 tablet 1  . glipiZIDE (GLUCOTROL XL) 5 MG 24 hr tablet Take 5 mg by mouth daily with breakfast.    . guaiFENesin-codeine 100-10 MG/5ML syrup Take 5 mLs by mouth every 8 (eight) hours as needed for cough. 120 mL 0  . hydrALAZINE (APRESOLINE) 25 MG tablet TAKE 1 TABLET (25 MG TOTAL) BY MOUTH 3 (THREE) TIMES DAILY. 90 tablet 5  . HYDROcodone-homatropine (HYCODAN) 5-1.5 MG/5ML syrup Take 5 mLs by mouth every 6 (six) hours as needed for cough. 120 mL 0  . losartan (COZAAR) 50 MG tablet TAKE 1 TABLET DAILY 90 tablet 2  . nebivolol (BYSTOLIC) 10 MG tablet 1 tab po bid (Patient taking differently: Take 10 mg by mouth 2 (two) times daily. ) 180 tablet 3  . sitaGLIPtin (JANUVIA) 25 MG tablet Take 1 tablet (25 mg total) by mouth daily. 90 tablet 1  . traMADol (ULTRAM) 50 MG tablet Take 1 tablet (50 mg total) by mouth every 6 (six) hours as needed. 15 tablet 0   No current facility-administered medications on file prior to visit.   No Known Allergies History   Social History  . Marital Status: Married  Spouse Name: N/A  . Number of Children: N/A  . 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 Smoking status: Never Smoker   . Smokeless tobacco: Not on file  . Alcohol Use: No  . Drug Use: No  . Sexual Activity: Yes   Other Topics Concern  . Not on file   Social History Narrative   Works at [employer] in [place].    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   Also has to get items from high shelves-causes achy pain in shoulder area      Married.   Children are grown, out of house.      Review of Systems  All  other systems reviewed and are negative.      Objective:   Physical Exam  Constitutional: She appears well-developed and well-nourished.  HENT:  Right Ear: External ear normal.  Left Ear: External ear normal.  Nose: Mucosal edema and rhinorrhea present.  Mouth/Throat: Oropharynx is clear and moist. No oropharyngeal exudate.  Eyes: Conjunctivae are normal. Pupils are equal, round, and reactive to light.  Neck: Neck supple.  Cardiovascular: Normal rate, regular rhythm and normal heart sounds.   No murmur heard. Pulmonary/Chest: Effort normal and breath sounds normal. No respiratory distress. She has no wheezes. She has no rales.  Lymphadenopathy:    She has no cervical adenopathy.          Assessment & Plan:  Cough - Plan: DG Chest 2 View  I do not believe this is infectious. Her symptoms are out of proportion to her exam. I believe this could be postnasal drip due to allergies. I will get a chest x-ray. If the chest x-ray is clear on when to try the patient on a prednisone taper pack in addition to [employer]. Await the results of the checks x-ray

## 2014-11-30 NOTE — Telephone Encounter (Signed)
Opened in error

## 2014-12-01 ENCOUNTER — Ambulatory Visit: Admitting: Family Medicine

## 2014-12-02 ENCOUNTER — Other Ambulatory Visit: Payer: Self-pay | Admitting: Family Medicine

## 2014-12-02 MED ORDER — BENZONATATE 100 MG PO CAPS: 200.0000 mg | ORAL_CAPSULE | Freq: Three times a day (TID) | ORAL | Status: DC | PRN

## 2014-12-02 MED ORDER — PREDNISONE 10 MG (21) PO TBPK: 10.0000 mg | ORAL_TABLET | Freq: Every day | ORAL | Status: DC

## 2014-12-05 ENCOUNTER — Emergency Department (HOSPITAL_COMMUNITY)
Admission: EM | Admit: 2014-12-05 | Discharge: 2014-12-05 | Disposition: A | Discharge status: Home / Self Care | Attending: Emergency Medicine | Admitting: Emergency Medicine

## 2014-12-05 ENCOUNTER — Encounter (HOSPITAL_COMMUNITY): Payer: Self-pay | Admitting: *Deleted

## 2014-12-05 DIAGNOSIS — Z7982 Long term (current) use of aspirin: Secondary | ICD-10-CM | POA: Insufficient documentation

## 2014-12-05 DIAGNOSIS — Z79899 Other long term (current) drug therapy: Secondary | ICD-10-CM | POA: Insufficient documentation

## 2014-12-05 DIAGNOSIS — E782 Mixed hyperlipidemia: Secondary | ICD-10-CM | POA: Diagnosis not present

## 2014-12-05 DIAGNOSIS — Z7952 Long term (current) use of systemic steroids: Secondary | ICD-10-CM | POA: Insufficient documentation

## 2014-12-05 DIAGNOSIS — J302 Other seasonal allergic rhinitis: Secondary | ICD-10-CM | POA: Diagnosis not present

## 2014-12-05 DIAGNOSIS — R05 Cough: Secondary | ICD-10-CM

## 2014-12-05 DIAGNOSIS — Z8719 Personal history of other diseases of the digestive system: Secondary | ICD-10-CM | POA: Insufficient documentation

## 2014-12-05 DIAGNOSIS — H578 Other specified disorders of eye and adnexa: Secondary | ICD-10-CM | POA: Insufficient documentation

## 2014-12-05 DIAGNOSIS — I129 Hypertensive chronic kidney disease with stage 1 through stage 4 chronic kidney disease, or unspecified chronic kidney disease: Secondary | ICD-10-CM | POA: Diagnosis not present

## 2014-12-05 DIAGNOSIS — E119 Type 2 diabetes mellitus without complications: Secondary | ICD-10-CM | POA: Diagnosis not present

## 2014-12-05 DIAGNOSIS — R059 Cough, unspecified: Secondary | ICD-10-CM

## 2014-12-05 DIAGNOSIS — N183 Chronic kidney disease, stage 3 (moderate): Secondary | ICD-10-CM | POA: Diagnosis not present

## 2014-12-05 MED ORDER — LORATADINE 10 MG PO TABS: 10.0000 mg | ORAL_TABLET | Freq: Every day | ORAL | Status: DC

## 2014-12-05 MED ORDER — ALBUTEROL SULFATE (2.5 MG/3ML) 0.083% IN NEBU
5.0000 mg | INHALATION_SOLUTION | Freq: Once | RESPIRATORY_TRACT | Status: AC
  Administered 2014-12-05: 5 mg via RESPIRATORY_TRACT
  Filled 2014-12-05: qty 6

## 2014-12-05 MED ORDER — LORATADINE 10 MG PO TABS
10.0000 mg | ORAL_TABLET | Freq: Once | ORAL | Status: AC
  Administered 2014-12-05: 10 mg via ORAL
  Filled 2014-12-05: qty 1

## 2014-12-05 MED ORDER — ALBUTEROL SULFATE HFA 108 (90 BASE) MCG/ACT IN AERS
2.0000 | INHALATION_SPRAY | RESPIRATORY_TRACT | Status: DC | PRN
Start: 2014-12-05 — End: 2014-12-05
  Administered 2014-12-05: 2 via RESPIRATORY_TRACT
  Filled 2014-12-05: qty 6.7

## 2014-12-05 NOTE — Discharge Instructions (Signed)
Allergic Rhinitis Allergic rhinitis is when the mucous membranes in the nose respond to allergens. Allergens are particles in the air that cause your body to have an allergic reaction. This causes you to release allergic antibodies. Through a chain of events, these eventually cause you to release histamine into the blood stream. Although meant to protect the body, it is this release of histamine that causes your discomfort, such as frequent sneezing, congestion, and an itchy, runny nose.  CAUSES  Seasonal allergic rhinitis (hay fever) is caused by pollen allergens that may come from grasses, trees, and weeds. Year-round allergic rhinitis (perennial allergic rhinitis) is caused by allergens such as house dust mites, pet dander, and mold spores.  SYMPTOMS   Nasal stuffiness (congestion).  Itchy, runny nose with sneezing and tearing of the eyes. DIAGNOSIS  Your health care provider can help you determine the allergen or allergens that trigger your symptoms. If you and your health care provider are unable to determine the allergen, skin or blood testing may be used. TREATMENT  Allergic rhinitis does not have a cure, but it can be controlled by:  Medicines and allergy shots (immunotherapy).  Avoiding the allergen. Hay fever may often be treated with antihistamines in pill or nasal spray forms. Antihistamines block the effects of histamine. There are over-the-counter medicines that may help with nasal congestion and swelling around the eyes. Check with your health care provider before taking or giving this medicine.  If avoiding the allergen or the medicine prescribed do not work, there are many new medicines your health care provider can prescribe. Stronger medicine may be used if initial measures are ineffective. Desensitizing injections can be used if medicine and avoidance does not work. Desensitization is when a patient is given ongoing shots until the body becomes less sensitive to the allergen.  Make sure you follow up with your health care provider if problems continue. HOME CARE INSTRUCTIONS It is not possible to completely avoid allergens, but you can reduce your symptoms by taking steps to limit your exposure to them. It helps to know exactly what you are allergic to so that you can avoid your specific triggers. SEEK MEDICAL CARE IF:   You have a fever.  You develop a cough that does not stop easily (persistent).  You have shortness of breath.  You start wheezing.  Symptoms interfere with normal daily activities. Document Released: 04/25/2001 Document Revised: 08/05/2013 Document Reviewed: 04/07/2013 [facility] Patient Information 2015 [place], [place]. This information is not intended to replace advice given to you by your health care provider. Make sure you discuss any questions you have with your health care provider.  Cough, Adult  A cough is a reflex that helps clear your throat and airways. It can help heal the body or may be a reaction to an irritated airway. A cough may only last 2 or 3 weeks (acute) or may last more than 8 weeks (chronic).  CAUSES Acute cough:  Viral or bacterial infections. Chronic cough:  Infections.  Allergies.  Asthma.  Post-nasal drip.  Smoking.  Heartburn or acid reflux.  Some medicines.  Chronic lung problems (COPD).  Cancer. SYMPTOMS   Cough.  Fever.  Chest pain.  Increased breathing rate.  High-pitched whistling sound when breathing (wheezing).  Colored mucus that you cough up (sputum). TREATMENT   A bacterial cough may be treated with antibiotic medicine.  A viral cough must run its course and will not respond to antibiotics.  Your caregiver may recommend other treatments if you  have a chronic cough. HOME CARE INSTRUCTIONS   Only take over-the-counter or prescription medicines for pain, discomfort, or fever as directed by your caregiver. Use cough suppressants only as directed by your caregiver.  Use a  cold steam vaporizer or humidifier in your bedroom or home to help loosen secretions.  Sleep in a semi-upright position if your cough is worse at night.  Rest as needed.  Stop smoking if you smoke. SEEK IMMEDIATE MEDICAL CARE IF:   You have pus in your sputum.  Your cough starts to worsen.  You cannot control your cough with suppressants and are losing sleep.  You begin coughing up blood.  You have difficulty breathing.  You develop pain which is getting worse or is uncontrolled with medicine.  You have a fever. MAKE SURE YOU:   Understand these instructions.  Will watch your condition.  Will get help right away if you are not doing well or get worse. Document Released: 01/27/2011 Document Revised: 10/23/2011 Document Reviewed: 01/27/2011 [facility] Patient Information 2015 [place], [place]. This information is not intended to replace advice given to you by your health care provider. Make sure you discuss any questions you have with your health care provider.

## 2014-12-05 NOTE — ED Provider Notes (Signed)
CSN: [id number]     Arrival date & time 12/05/14  0248 History   First MD Initiated Contact with Patient 12/05/14 0304     No chief complaint on file.    (Consider location/radiation/quality/duration/timing/severity/associated sxs/prior Treatment) HPI Patient presents with one month of watery eyes, nasal congestion and cough. She's had no fever or chills. She's been seen by her primary physician several times and has tried Vicodin and most recently [employer]. She states he has little relief with these. Recently had chest x-ray that just showed minimal atelectasis. She currently is taking course of prednisone prescribed by her primary physician. After reviewing her primary physician's notes he believes that the patient's symptoms are likely allergic in nature. Past Medical History  Diagnosis Date  . Mixed hyperlipidemia   . Essential hypertension, benign   . Hemorrhoids   . S/P colonoscopy Jan 2011    Dr. Benson Norway: sessile polyp (benign lymphoid), large hemorrhoids, repeat 5-10 years  . CKD (chronic kidney disease) stage 3, GFR 30-59 ml/min   . GERD (gastroesophageal reflux disease)   . Type 2 diabetes mellitus    Past Surgical History  Procedure Laterality Date  . Abdominal hysterectomy    . Esophagogastroduodenoscopy  09/05/2011    [id number] hiatal hernia; remainder of exam normal. No explanation for patient's abdominal pain with today's examination  . Cholecystectomy  09/29/2011    Procedure: LAPAROSCOPIC CHOLECYSTECTOMY;  Surgeon: Jamesetta So, MD;  Location: [facility];  Service: General;  Laterality: N/A;  . Laparoscopic appendectomy  09/29/2011    Procedure: APPENDECTOMY LAPAROSCOPIC;  Surgeon: Jamesetta So, MD;  Location: [facility];  Service: General;;  incidental appendectomy  . Colonoscopy  Jan 2011    Dr. Benson Norway: sessile polyp (benign lymphoid), large hemorrhoids, repeat 5-10 years  . Esophagogastroduodenoscopy N/A 12/17/2013    Procedure: ESOPHAGOGASTRODUODENOSCOPY (EGD);   Surgeon: Daneil Dolin, MD;  [facility];  Service: Endoscopy;  Laterality: N/A;  9:30   Family History  Problem Relation Age of Onset  . Colon cancer Neg Hx   . Hypertension Mother   . Coronary artery disease Mother   . Diabetes Mother   . Hypertension Sister   . Coronary artery disease Sister   . Hypertension Brother    History  Substance Use Topics  . Smoking status: Never Smoker   . Smokeless tobacco: Not on file  . Alcohol Use: No   OB History    No data available     Review of Systems  Constitutional: Negative for fever and chills.  HENT: Positive for congestion and postnasal drip. Negative for sinus pressure and sore throat.   Eyes: Positive for discharge. Negative for pain, redness and visual disturbance.  Respiratory: Positive for cough. Negative for shortness of breath and wheezing.   Cardiovascular: Negative for chest pain, palpitations and leg swelling.  Gastrointestinal: Negative for nausea, vomiting and abdominal pain.  Skin: Negative for rash and wound.  Neurological: Negative for dizziness, weakness, light-headedness, numbness and headaches.  All other systems reviewed and are negative.     Allergies  Review of patient's allergies indicates no known allergies.  Home Medications   Prior to Admission medications   Medication Sig Start Date End Date Taking? Authorizing Provider  amLODipine (NORVASC) 10 MG tablet TAKE 1 TABLET DAILY 11/06/14   [name], MD  aspirin EC 81 MG tablet Take 81 mg by mouth daily.      Historical Provider, MD  atorvastatin (LIPITOR) 20 MG tablet TAKE 1  TABLET DAILY 11/06/14   [name], MD  benzonatate (TESSALON PERLES) 100 MG capsule Take 2 capsules (200 mg total) by mouth 3 (three) times daily as needed for cough. 12/02/14   [name], MD  cloNIDine (CATAPRES) 0.2 MG tablet TAKE 1 TABLET TWICE A DAY 11/30/14   [name], MD  glipiZIDE (GLUCOTROL XL) 5 MG 24 hr tablet Take 5 mg by mouth daily  with breakfast.    Historical Provider, MD  guaiFENesin-codeine 100-10 MG/5ML syrup Take 5 mLs by mouth every 8 (eight) hours as needed for cough. 11/20/14   [name], MD  hydrALAZINE (APRESOLINE) 25 MG tablet TAKE 1 TABLET (25 MG TOTAL) BY MOUTH 3 (THREE) TIMES DAILY. 11/16/14   [name], MD  HYDROcodone-homatropine [facility]) 5-1.5 MG/5ML syrup Take 5 mLs by mouth every 6 (six) hours as needed for cough. 11/25/14   [name], MD  loratadine (CLARITIN) 10 MG tablet Take 1 tablet (10 mg total) by mouth daily. 12/05/14   Julianne Rice, MD  losartan (COZAAR) 50 MG tablet TAKE 1 TABLET DAILY 11/06/14   [name], MD  nebivolol (BYSTOLIC) 10 MG tablet 1 tab po bid Patient taking differently: Take 10 mg by mouth 2 (two) times daily.  03/17/14   [name], MD  predniSONE (STERAPRED UNI-PAK 21 TAB) 10 MG (21) TBPK tablet Take 1 tablet (10 mg total) by mouth daily. 12/02/14   [name], MD  sitaGLIPtin (JANUVIA) 25 MG tablet Take 1 tablet (25 mg total) by mouth daily. 03/19/14   [name], MD  traMADol (ULTRAM) 50 MG tablet Take 1 tablet (50 mg total) by mouth every 6 (six) hours as needed. 09/22/14   Orpah Greek, MD   BP 152/93 mmHg  Pulse 67  Temp(Src) 97.6 F (36.4 C) (Oral)  Resp 18  Ht 5\' 2"  (1.575 m)  Wt 172 lb (78.019 kg)  BMI 31.45 kg/m2  SpO2 98% Physical Exam  Constitutional: She is oriented to person, place, and time. She appears well-developed and well-nourished. No distress.  HENT:  Head: Normocephalic and atraumatic.  Mouth/Throat: Oropharynx is clear and moist. No oropharyngeal exudate.  Bilateral nasal mucosal edema  Eyes: EOM are normal. Pupils are equal, round, and reactive to light.  Neck: Normal range of motion. Neck supple.  Cardiovascular: Normal rate and regular rhythm.  Exam reveals no gallop and no friction rub.   No murmur heard. Pulmonary/Chest: Effort normal. No respiratory distress. She has no wheezes. She has no  rales. She exhibits no tenderness.  Decreased breath sounds throughout  Abdominal: Soft. Bowel sounds are normal. She exhibits no distension and no mass. There is no tenderness. There is no rebound and no guarding.  Musculoskeletal: Normal range of motion. She exhibits no edema or tenderness.  Neurological: She is alert and oriented to person, place, and time.  Skin: Skin is warm and dry. No rash noted. No erythema.  Psychiatric: She has a normal mood and affect. Her behavior is normal.  Nursing note and vitals reviewed.   ED Course  Procedures (including critical care time) Labs Review Labs Reviewed - No data to display  Imaging Review No results found.   EKG Interpretation None      MDM   Final diagnoses:  Cough  Seasonal allergies    Will start on Claritin and give nebulized albuterol and reassess.  Patient states she is feeling better after nebulized treatment and Claritin. Improved air movement. Vital  signs remained stable. We'll start on Claritin daily. Patient is advised to follow-up with her primary physician and return precautions have been given.  Julianne Rice, MD 12/05/14 [phone]

## 2014-12-05 NOTE — ED Notes (Signed)
Pt c/o cough x 1 month. Pt has seen her PCP and has been given medications without relief.

## 2015-03-14 NOTE — Progress Notes (Signed)
REVIEWED-NO ADDITIONAL RECOMMENDATIONS. 

## 2015-05-26 ENCOUNTER — Encounter: Payer: Self-pay | Admitting: Family Medicine

## 2015-05-26 ENCOUNTER — Ambulatory Visit (INDEPENDENT_AMBULATORY_CARE_PROVIDER_SITE_OTHER): Admitting: Family Medicine

## 2015-05-26 VITALS — BP 136/70 | HR 80 | Temp 97.6°F | Resp 16 | Ht 62.0 in | Wt 183.0 lb

## 2015-05-26 DIAGNOSIS — N183 Chronic kidney disease, stage 3 unspecified: Secondary | ICD-10-CM

## 2015-05-26 DIAGNOSIS — E1122 Type 2 diabetes mellitus with diabetic chronic kidney disease: Secondary | ICD-10-CM

## 2015-05-26 DIAGNOSIS — I10 Essential (primary) hypertension: Secondary | ICD-10-CM

## 2015-05-26 DIAGNOSIS — J0100 Acute maxillary sinusitis, unspecified: Secondary | ICD-10-CM | POA: Diagnosis not present

## 2015-05-26 LAB — CBC WITH DIFFERENTIAL/PLATELET
Basophils Absolute: 0 10*3/uL (ref 0.0–0.1)
Basophils Relative: 0 % (ref 0–1)
Eosinophils Absolute: 0.2 10*3/uL (ref 0.0–0.7)
Eosinophils Relative: 2 % (ref 0–5)
HCT: 37.3 % (ref 36.0–46.0)
Hemoglobin: 12.3 g/dL (ref 12.0–15.0)
Lymphocytes Relative: 36 % (ref 12–46)
Lymphs Abs: 3 10*3/uL (ref 0.7–4.0)
MCH: 28.5 pg (ref 26.0–34.0)
MCHC: 33 g/dL (ref 30.0–36.0)
MCV: 86.3 fL (ref 78.0–100.0)
MPV: 10 fL (ref 8.6–12.4)
Monocytes Absolute: 0.6 10*3/uL (ref 0.1–1.0)
Monocytes Relative: 7 % (ref 3–12)
Neutro Abs: 4.6 10*3/uL (ref 1.7–7.7)
Neutrophils Relative %: 55 % (ref 43–77)
Platelets: 361 10*3/uL (ref 150–400)
RBC: 4.32 MIL/uL (ref 3.87–5.11)
RDW: 15.9 % — ABNORMAL HIGH (ref 11.5–15.5)
WBC: 8.4 10*3/uL (ref 4.0–10.5)

## 2015-05-26 LAB — COMPLETE METABOLIC PANEL WITH GFR
ALT: 18 U/L (ref 6–29)
AST: 11 U/L (ref 10–35)
Albumin: 3.6 g/dL (ref 3.6–5.1)
Alkaline Phosphatase: 92 U/L (ref 33–130)
BUN: 27 mg/dL — ABNORMAL HIGH (ref 7–25)
CO2: 25 mmol/L (ref 20–31)
Calcium: 9 mg/dL (ref 8.6–10.4)
Chloride: 109 mmol/L (ref 98–110)
Creat: 2.68 mg/dL — ABNORMAL HIGH (ref 0.50–1.05)
GFR, Est African American: 23 mL/min — ABNORMAL LOW (ref >=60)
GFR, Est Non African American: 20 mL/min — ABNORMAL LOW (ref >=60)
Glucose, Bld: 92 mg/dL (ref 70–99)
Potassium: 3.8 mmol/L (ref 3.5–5.3)
Sodium: 143 mmol/L (ref 135–146)
Total Bilirubin: 0.5 mg/dL (ref 0.2–1.2)
Total Protein: 6.6 g/dL (ref 6.1–8.1)

## 2015-05-26 LAB — LIPID PANEL
Cholesterol: 190 mg/dL (ref 125–200)
HDL: 42 mg/dL — ABNORMAL LOW (ref >=46)
LDL Cholesterol: 99 mg/dL (ref <130)
Total CHOL/HDL Ratio: 4.5 Ratio (ref <=5.0)
Triglycerides: 243 mg/dL — ABNORMAL HIGH (ref <150)
VLDL: 49 mg/dL — ABNORMAL HIGH (ref <30)

## 2015-05-26 MED ORDER — FLUCONAZOLE 150 MG PO TABS: 150.0000 mg | ORAL_TABLET | Freq: Once | ORAL | Status: DC

## 2015-05-26 MED ORDER — AMOXICILLIN 875 MG PO TABS: 875.0000 mg | ORAL_TABLET | Freq: Two times a day (BID) | ORAL | Status: DC

## 2015-05-26 NOTE — Progress Notes (Signed)
Patient ID: Megan [name], female   DOB: Dec 28, 1960, 55 y.o.   MRN: [id number]   Subjective:    Patient ID: Megan [name], female    DOB: [birth year], 54 y.o.   MRN: [id number]  Patient presents for B Ear Pain  patient here with bilateral ear pain for the past 4 days. She's also had sinus drainage and cough mild production. She's not had any fever. She's been using some over-the-counter swimmer's eardrops. She's history of diabetes mellitus her last A1c was back in January she's not had any follow-up for her lab since then.     Review Of Systems: per above   GEN- denies fatigue, fever, weight loss,weakness, recent illness HEENT- denies eye drainage, change in vision, +nasal discharge, CVS- denies chest pain, palpitations RESP- denies SOB, +cough, wheeze ABD- denies N/V, change in stools, abd pain Neuro- denies headache, dizziness, syncope, seizure activity       Objective:    BP 136/70 mmHg  Pulse 80  Temp(Src) 97.6 F (36.4 C) (Oral)  Resp 16  Ht 5\' 2"  (1.575 m)  Wt 183 lb (83.008 kg)  BMI 33.46 kg/m2 GEN- NAD, alert and oriented x3 HEENT- PERRL, EOMI, non injected sclera, pink conjunctiva, MMM, oropharynx mild injection, TM clear bilat no effusion,  + maxillary sinus tenderness, +  Nasal drainage  Neck- Supple, no LAD CVS- RRR, no murmur RESP-CTAB EXT- No edema Pulses- Radial 2+           Assessment & Plan:      Problem List Items Addressed This Visit    Type 2 diabetes mellitus (HCC) - Primary    Recheck A1C and fasting labs pt over due for, also has CKD stage III- followed by Dr. Toniann Fail- [facility] [place]      Relevant Orders   CBC with Differential/Platelet   Hemoglobin A1c   Lipid panel   COMPLETE METABOLIC PANEL WITH GFR   Essential hypertension, benign    Controlled       CKD (chronic kidney disease) stage 3, GFR 30-59 ml/min   Relevant Orders   COMPLETE METABOLIC PANEL WITH GFR    Other Visit Diagnoses    Acute maxillary sinusitis,  recurrence not specified        Treat with antibiotics, claritin, nasal rinse, no decongestants with medications    Relevant Medications    amoxicillin (AMOXIL) 875 MG tablet    fluconazole (DIFLUCAN) 150 MG tablet       Note: This dictation was prepared with Dragon dictation along with smaller phrase technology. Any transcriptional errors that result from this process are unintentional.

## 2015-05-26 NOTE — Patient Instructions (Signed)
Claritin for sinuses  Amoixillin twice a day  Diflucan for yeast  We will call with lab results F/U 3 months - Dr. Dennard Schaumann

## 2015-05-26 NOTE — Assessment & Plan Note (Signed)
Controlled.  

## 2015-05-26 NOTE — Assessment & Plan Note (Signed)
Recheck A1C and fasting labs pt over due for, also has CKD stage III- followed by Dr. Toniann FailAngelina Sheriff [place]

## 2015-05-27 LAB — HEMOGLOBIN A1C
Hgb A1c MFr Bld: 6.7 % — ABNORMAL HIGH (ref <5.7)
Mean Plasma Glucose: 146 mg/dL — ABNORMAL HIGH (ref <117)

## 2015-05-31 ENCOUNTER — Encounter: Payer: Self-pay | Admitting: *Deleted

## 2015-06-02 ENCOUNTER — Other Ambulatory Visit: Payer: Self-pay | Admitting: *Deleted

## 2015-06-02 MED ORDER — GLIPIZIDE ER 10 MG PO TB24: 10.0000 mg | ORAL_TABLET | Freq: Every day | ORAL | Status: DC

## 2015-07-19 ENCOUNTER — Telehealth: Payer: Self-pay | Admitting: Family Medicine

## 2015-07-19 MED ORDER — HYDRALAZINE HCL 25 MG PO TABS: ORAL_TABLET | ORAL | Status: DC

## 2015-07-19 MED ORDER — LOSARTAN POTASSIUM 50 MG PO TABS: 50.0000 mg | ORAL_TABLET | Freq: Every day | ORAL | Status: DC

## 2015-07-19 NOTE — Telephone Encounter (Signed)
Medication called/sent to requested pharmacy  

## 2015-07-19 NOTE — Telephone Encounter (Signed)
[facility] hydr[facility]INE (APRESOLINE) 25 MG tablet and also losartan (COZAAR) 50 MG tablet  [phone]

## 2015-08-26 ENCOUNTER — Other Ambulatory Visit: Payer: Self-pay | Admitting: Family Medicine

## 2015-08-26 DIAGNOSIS — Z1231 Encounter for screening mammogram for malignant neoplasm of breast: Secondary | ICD-10-CM

## 2015-08-27 ENCOUNTER — Ambulatory Visit (INDEPENDENT_AMBULATORY_CARE_PROVIDER_SITE_OTHER): Admitting: Family Medicine

## 2015-08-27 ENCOUNTER — Encounter: Payer: Self-pay | Admitting: Family Medicine

## 2015-08-27 VITALS — BP 140/90 | HR 72 | Temp 97.4°F | Resp 18 | Wt 191.0 lb

## 2015-08-27 DIAGNOSIS — N184 Chronic kidney disease, stage 4 (severe): Secondary | ICD-10-CM | POA: Diagnosis not present

## 2015-08-27 DIAGNOSIS — I10 Essential (primary) hypertension: Secondary | ICD-10-CM | POA: Diagnosis not present

## 2015-08-27 DIAGNOSIS — E1122 Type 2 diabetes mellitus with diabetic chronic kidney disease: Secondary | ICD-10-CM | POA: Diagnosis not present

## 2015-08-27 DIAGNOSIS — Z23 Encounter for immunization: Secondary | ICD-10-CM

## 2015-08-27 DIAGNOSIS — N185 Chronic kidney disease, stage 5: Secondary | ICD-10-CM

## 2015-08-27 LAB — CBC WITH DIFFERENTIAL/PLATELET
Basophils Absolute: 0.1 10*3/uL (ref 0.0–0.1)
Basophils Relative: 1 % (ref 0–1)
Eosinophils Absolute: 0.2 10*3/uL (ref 0.0–0.7)
Eosinophils Relative: 2 % (ref 0–5)
HCT: 35 % — ABNORMAL LOW (ref 36.0–46.0)
Hemoglobin: 11.5 g/dL — ABNORMAL LOW (ref 12.0–15.0)
Lymphocytes Relative: 35 % (ref 12–46)
Lymphs Abs: 2.9 10*3/uL (ref 0.7–4.0)
MCH: 28.9 pg (ref 26.0–34.0)
MCHC: 32.9 g/dL (ref 30.0–36.0)
MCV: 87.9 fL (ref 78.0–100.0)
MPV: 9.6 fL (ref 8.6–12.4)
Monocytes Absolute: 0.6 10*3/uL (ref 0.1–1.0)
Monocytes Relative: 7 % (ref 3–12)
Neutro Abs: 4.6 10*3/uL (ref 1.7–7.7)
Neutrophils Relative %: 55 % (ref 43–77)
Platelets: 336 10*3/uL (ref 150–400)
RBC: 3.98 MIL/uL (ref 3.87–5.11)
RDW: 16.9 % — ABNORMAL HIGH (ref 11.5–15.5)
WBC: 8.3 10*3/uL (ref 4.0–10.5)

## 2015-08-27 LAB — LIPID PANEL
Cholesterol: 173 mg/dL (ref 125–200)
HDL: 38 mg/dL — ABNORMAL LOW (ref >=46)
LDL Cholesterol: 91 mg/dL (ref <130)
Total CHOL/HDL Ratio: 4.6 Ratio (ref <=5.0)
Triglycerides: 219 mg/dL — ABNORMAL HIGH (ref <150)
VLDL: 44 mg/dL — ABNORMAL HIGH (ref <30)

## 2015-08-27 LAB — COMPLETE METABOLIC PANEL WITH GFR
ALT: 17 U/L (ref 6–29)
AST: 13 U/L (ref 10–35)
Albumin: 3.3 g/dL — ABNORMAL LOW (ref 3.6–5.1)
Alkaline Phosphatase: 87 U/L (ref 33–130)
BUN: 33 mg/dL — ABNORMAL HIGH (ref 7–25)
CO2: 22 mmol/L (ref 20–31)
Calcium: 8.6 mg/dL (ref 8.6–10.4)
Chloride: 107 mmol/L (ref 98–110)
Creat: 3.22 mg/dL — ABNORMAL HIGH (ref 0.50–1.05)
GFR, Est African American: 18 mL/min — ABNORMAL LOW (ref >=60)
GFR, Est Non African American: 16 mL/min — ABNORMAL LOW (ref >=60)
Glucose, Bld: 105 mg/dL — ABNORMAL HIGH (ref 70–99)
Potassium: 4.3 mmol/L (ref 3.5–5.3)
Sodium: 141 mmol/L (ref 135–146)
Total Bilirubin: 0.5 mg/dL (ref 0.2–1.2)
Total Protein: 6.3 g/dL (ref 6.1–8.1)

## 2015-08-27 LAB — HEMOGLOBIN A1C
Hgb A1c MFr Bld: 6.6 % — ABNORMAL HIGH (ref <5.7)
Mean Plasma Glucose: 143 mg/dL — ABNORMAL HIGH (ref <117)

## 2015-08-27 NOTE — Addendum Note (Signed)
Addended by: Shary Decamp B on: 08/27/2015 08:56 AM   Modules accepted: Orders

## 2015-08-27 NOTE — Progress Notes (Signed)
Subjective:    Patient ID: Megan [name], female    DOB: 02-12-1961, 55 y.o.   MRN: [id number]  HPI Patient is here today for general checkup. She has a history of hyperlipidemia, hypertension, type 2 diabetes, and stage IV chronic kidney disease. Her blood pressure today is borderline at 140/90 but for this patient has actually good. She denies any chest pain shortness of breath or dyspnea on exertion. She is currently on glipizide for diabetes. She denies any hypoglycemic episodes. She is not checking her sugars regularly. She denies any blurred vision, polyuria, polydipsia. She denies any myalgias or right upper quadrant pain. She is scheduling her mammogram for this month. She is due for a flu shot and pneumonia shot. She does not want the flu shot. I discussed both of these today with her at length and she is still unsure whether or not she wants the vaccinations. Eye exam was performed last year in February. Diabetic foot exam is performed today Past Medical History  Diagnosis Date  . Mixed hyperlipidemia   . Essential hypertension, benign   . Hemorrhoids   . S/P colonoscopy Jan 2011    Dr. Benson Norway: sessile polyp (benign lymphoid), large hemorrhoids, repeat 5-10 years  . CKD (chronic kidney disease) stage 3, GFR 30-59 ml/min   . GERD (gastroesophageal reflux disease)   . Type 2 diabetes mellitus [facility])    Past Surgical History  Procedure Laterality Date  . Abdominal hysterectomy    . Esophagogastroduodenoscopy  09/05/2011    [id number] hiatal hernia; remainder of exam normal. No explanation for patient's abdominal pain with today's examination  . Cholecystectomy  09/29/2011    Procedure: LAPAROSCOPIC CHOLECYSTECTOMY;  Surgeon: Jamesetta So, MD;  Location: [facility];  Service: General;  Laterality: N/A;  . Laparoscopic appendectomy  09/29/2011    Procedure: APPENDECTOMY LAPAROSCOPIC;  Surgeon: Jamesetta So, MD;  Location: [facility];  Service: General;;  incidental appendectomy  .  Colonoscopy  Jan 2011    Dr. Benson Norway: sessile polyp (benign lymphoid), large hemorrhoids, repeat 5-10 years  . Esophagogastroduodenoscopy N/A 12/17/2013    Procedure: ESOPHAGOGASTRODUODENOSCOPY (EGD);  Surgeon: Daneil Dolin, MD;  [facility];  Service: Endoscopy;  Laterality: N/A;  9:30   Current Outpatient Prescriptions on File Prior to Visit  Medication Sig Dispense Refill  . aspirin EC 81 MG tablet Take 81 mg by mouth daily.      Marland Kitchen atorvastatin (LIPITOR) 20 MG tablet TAKE 1 TABLET DAILY 90 tablet 2  . cloNIDine (CATAPRES) 0.2 MG tablet TAKE 1 TABLET TWICE A DAY 180 tablet 1  . glipiZIDE (GLUCOTROL XL) 10 MG 24 hr tablet Take 1 tablet (10 mg total) by mouth daily with breakfast. 90 tablet 1  . hydrALAZINE (APRESOLINE) 25 MG tablet TAKE 1 TABLET (25 MG TOTAL) BY MOUTH 3 (THREE) TIMES DAILY. 270 tablet 3  . nebivolol (BYSTOLIC) 10 MG tablet 1 tab po bid (Patient taking differently: Take 10 mg by mouth 2 (two) times daily. ) 180 tablet 3   No current facility-administered medications on file prior to visit.   No Known Allergies Social History   Social History  . Marital Status: Married    Spouse Name: N/A  . Number of Children: N/A  . 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 Smoking status: Never Smoker   . Smokeless tobacco: Not on file  . Alcohol Use: No  . Drug  Use: No  . Sexual Activity: Yes   Other Topics Concern  . Not on file   Social History Narrative   Works at [employer] in [place].    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   Also has to get items from high shelves-causes achy pain in shoulder area      Married.   Children are grown, out of house.     Review of Systems  All other systems reviewed and are negative.      Objective:   Physical Exam  Constitutional: She appears well-developed and well-nourished.  Neck: Neck supple. No JVD present. No thyromegaly present.    Cardiovascular: Normal rate, regular rhythm, normal heart sounds and intact distal pulses.  Exam reveals no gallop and no friction rub.   No murmur heard. Pulmonary/Chest: Effort normal and breath sounds normal. No respiratory distress. She has no wheezes. She has no rales.  Abdominal: Soft. Bowel sounds are normal. She exhibits no distension. There is no tenderness. There is no rebound and no guarding.  Musculoskeletal: She exhibits no edema.  Vitals reviewed.         Assessment & Plan:  Chronic kidney disease (CKD), stage IV (severe) (HCC)  Essential hypertension, benign  Controlled type 2 diabetes mellitus with stage 5 chronic kidney disease not on chronic dialysis, without long-term current use of insulin ([place]) - Plan: CBC with Differential/Platelet, COMPLETE METABOLIC PANEL WITH GFR, Hemoglobin A1c, Lipid panel, Microalbumin, urine  Eye exam is up-to-date. I recommended a flu shot as well as Pneumovax 23 but the patient is unsure and does not want him today. I recommended an aspirin 81 mg by mouth daily. Patient will schedule her mammogram. Blood pressures acceptable. I will check a fasting lipid panel. Goal LDL cholesterol is less than 100. I will check a hemoglobin A1c. Goal hemoglobin A1c is less than 6.5. If patient changes her mind she is welcome to receive the flu shot or Pneumovax 23 at her convenience.

## 2015-09-02 ENCOUNTER — Ambulatory Visit (HOSPITAL_COMMUNITY)
Admission: RE | Admit: 2015-09-02 | Discharge: 2015-09-02 | Disposition: A | Discharge status: Home / Self Care | Source: Ambulatory Visit | Attending: Family Medicine | Admitting: Family Medicine

## 2015-09-02 DIAGNOSIS — Z1231 Encounter for screening mammogram for malignant neoplasm of breast: Secondary | ICD-10-CM | POA: Diagnosis not present

## 2015-09-20 ENCOUNTER — Other Ambulatory Visit: Payer: Self-pay | Admitting: Family Medicine

## 2015-09-20 MED ORDER — LOSARTAN POTASSIUM 100 MG PO TABS: 100.0000 mg | ORAL_TABLET | Freq: Every day | ORAL | Status: DC

## 2015-09-20 MED ORDER — GLIPIZIDE ER 10 MG PO TB24: 10.0000 mg | ORAL_TABLET | Freq: Every day | ORAL | Status: DC

## 2015-09-20 NOTE — Telephone Encounter (Signed)
Prescription sent to pharmacy.

## 2015-09-20 NOTE — Telephone Encounter (Signed)
Patient requesting a refill on her losartan (COZAAR) 100 MG tablet and glipiZIDE (GLUCOTROL XL) 10 MG   CB# [phone] She needs [facility].

## 2015-09-22 ENCOUNTER — Other Ambulatory Visit (HOSPITAL_COMMUNITY): Payer: Self-pay | Admitting: Nephrology

## 2015-09-22 DIAGNOSIS — N184 Chronic kidney disease, stage 4 (severe): Secondary | ICD-10-CM

## 2015-09-27 ENCOUNTER — Ambulatory Visit (HOSPITAL_COMMUNITY)
Admission: RE | Admit: 2015-09-27 | Discharge: 2015-09-27 | Disposition: A | Discharge status: Home / Self Care | Source: Ambulatory Visit | Attending: Nephrology | Admitting: Nephrology

## 2015-09-27 DIAGNOSIS — N184 Chronic kidney disease, stage 4 (severe): Secondary | ICD-10-CM | POA: Insufficient documentation

## 2015-12-01 ENCOUNTER — Encounter: Payer: Self-pay | Admitting: Family Medicine

## 2015-12-01 ENCOUNTER — Ambulatory Visit (INDEPENDENT_AMBULATORY_CARE_PROVIDER_SITE_OTHER): Admitting: Family Medicine

## 2015-12-01 VITALS — BP 142/88 | HR 78 | Temp 97.9°F | Resp 12 | Ht 62.0 in | Wt 188.0 lb

## 2015-12-01 DIAGNOSIS — N3945 Continuous leakage: Secondary | ICD-10-CM

## 2015-12-01 DIAGNOSIS — J301 Allergic rhinitis due to pollen: Secondary | ICD-10-CM

## 2015-12-01 MED ORDER — AMOXICILLIN 875 MG PO TABS
875.0000 mg | ORAL_TABLET | Freq: Two times a day (BID) | ORAL | Status: DC
Start: 2015-12-01 — End: 2015-12-30

## 2015-12-01 MED ORDER — AMOXICILLIN 875 MG PO TABS: 875.0000 mg | ORAL_TABLET | Freq: Two times a day (BID) | ORAL | Status: DC

## 2015-12-01 MED ORDER — FLUCONAZOLE 150 MG PO TABS: 150.0000 mg | ORAL_TABLET | Freq: Once | ORAL | Status: DC

## 2015-12-01 MED ORDER — FLUTICASONE PROPIONATE 50 MCG/ACT NA SUSP: 2.0000 | Freq: Every day | NASAL | Status: DC

## 2015-12-01 MED ORDER — FLUTICASONE PROPIONATE 50 MCG/ACT NA SUSP
2.0000 | Freq: Every day | NASAL | Status: DC
Start: 2015-12-01 — End: 2015-12-30

## 2015-12-01 NOTE — Progress Notes (Signed)
Patient ID: ALEXSANDRIA BILLETT, female   DOB: May 06, 1961, 54 y.o.   MRN: [id number]   Subjective:    Patient ID: SIMRIT ROSICA, female    DOB: 03-Jul-1961, 55 y.o.   MRN: [id number]  Patient presents for Illness Patient here with right ear pain sinus pressure headache on the right side feels like when she previously had sinus infection. She's also had sneezing and some running nose since the polyp has been out. She did not try taking anything over-the-counter. She has not had any fever. No significant cough.  I reviewed her chart she has worsening chronic kidney disease and stage V her last creatinine was 3.2 she was seen by her nephrologist in [place] about 2 weeks ago  Estimated the visit she requested a GYN referral she's had bladder leakage for many years she has to wear a pad. She's also had a hysterectomy in the past.  Review Of Systems:  GEN- denies fatigue, fever, weight loss,weakness, recent illness HEENT- denies eye drainage, change in vision, nasal discharge, CVS- denies chest pain, palpitations RESP- denies SOB, cough, wheeze ABD- denies N/V, change in stools, abd pain GU- denies dysuria, hematuria, dribbling, incontinence MSK- denies joint pain, muscle aches, injury Neuro- denies headache, dizziness, syncope, seizure activity       Objective:    BP 142/88 mmHg  Pulse 78  Temp(Src) 97.9 F (36.6 C) (Oral)  Resp 12  Ht 5\' 2"  (1.575 m)  Wt 188 lb (85.276 kg)  BMI 34.38 kg/m2  SpO2 99% GEN- NAD, alert and oriented x3 HEENT- PERRL, EOMI, non injected sclera, pink conjunctiva, MMM, oropharynx mild injection, TM clear bilat no effusion,  +no  maxillary sinus tenderness,+  Nasal drainage  Neck- Supple, no LAD CVS- RRR, no murmur RESP-CTAB EXT- No edema Pulses- Radial 2+         Assessment & Plan:      Problem List Items Addressed This Visit    None    Visit Diagnoses    Allergic rhinitis due to pollen    -  Primary    More allergy symptoms, unable  to decongest due to BP., No true sinusitis at this time. Given flonase to take with claritin. She was concerned about sinus Infection as she is having the past. I did give her prescription ran by she's not to start this for at least another 5 days if  she does not improve.     Continuous leakage of urine        Likley has prolapsed bladder, send to GYN for evaluation.       Note: This dictation was prepared with Dragon dictation along with smaller phrase technology. Any transcriptional errors that result from this process are unintentional.

## 2015-12-01 NOTE — Patient Instructions (Addendum)
Release of records- Dr. Larena Glassman [facility]  Nephrology - Last note and labs  Take flonase  Continue clartin Start antibiotics if not better on Sat Referral to GYN F/U as needed

## 2015-12-26 ENCOUNTER — Other Ambulatory Visit: Payer: Self-pay | Admitting: Family Medicine

## 2015-12-30 ENCOUNTER — Encounter: Payer: Self-pay | Admitting: Obstetrics and Gynecology

## 2015-12-30 ENCOUNTER — Ambulatory Visit (INDEPENDENT_AMBULATORY_CARE_PROVIDER_SITE_OTHER): Admitting: Obstetrics and Gynecology

## 2015-12-30 VITALS — BP 140/90 | Ht 63.0 in | Wt 186.5 lb

## 2015-12-30 DIAGNOSIS — N898 Other specified noninflammatory disorders of vagina: Secondary | ICD-10-CM

## 2015-12-30 MED ORDER — METRONIDAZOLE 0.75 % VA GEL: 1.0000 | Freq: Every day | VAGINAL | Status: DC

## 2015-12-30 NOTE — Progress Notes (Signed)
[facility] Visit  @DATE @            Patient name: DEVINA KECKLER MRN [id number]  Date of birth: [birth year]  CC & HPI:  BRIYA GOTTESMAN is a 55 y.o. female presenting today for constant, waxing and waning, increased amounts of clear vaginal discharge that have been ongoing for 6 months. Patient states she sometimes gets up at night with her panties, occasionally soaking through to her slacks. This is a new problem. No treatments or medications were noted. Patient is postmenopausal.  ROS:  Review of Systems  Genitourinary:       Positive for increased vaginal discharge.  All other systems reviewed and are negative.    Pertinent History Reviewed:   Reviewed: Significant for abdominal hysterectomy Medical         Past Medical History  Diagnosis Date  . Mixed hyperlipidemia   . Essential hypertension, benign   . Hemorrhoids   . S/P colonoscopy Jan 2011    Dr. Benson Norway: sessile polyp (benign lymphoid), large hemorrhoids, repeat 5-10 years  . CKD (chronic kidney disease) stage 3, GFR 30-59 ml/min   . GERD (gastroesophageal reflux disease)   . Type 2 diabetes mellitus ([place])                               Surgical Hx:    Past Surgical History  Procedure Laterality Date  . Abdominal hysterectomy    . Esophagogastroduodenoscopy  09/05/2011    [id number] hiatal hernia; remainder of exam normal. No explanation for patient's abdominal pain with today's examination  . Cholecystectomy  09/29/2011    Procedure: LAPAROSCOPIC CHOLECYSTECTOMY;  Surgeon: Jamesetta So, MD;  Location: [facility];  Service: General;  Laterality: N/A;  . Laparoscopic appendectomy  09/29/2011    Procedure: APPENDECTOMY LAPAROSCOPIC;  Surgeon: Jamesetta So, MD;  Location: [facility];  Service: General;;  incidental appendectomy  . Colonoscopy  Jan 2011    Dr. Benson Norway: sessile polyp (benign lymphoid), large hemorrhoids, repeat 5-10 years  . Esophagogastroduodenoscopy N/A 12/17/2013    Procedure:  ESOPHAGOGASTRODUODENOSCOPY (EGD);  Surgeon: Daneil Dolin, MD;  [facility];  Service: Endoscopy;  Laterality: N/A;  9:30   Medications: Reviewed & Updated - see associated section                       Current outpatient prescriptions:  .  amLODipine (NORVASC) 5 MG tablet, Take 5 mg by mouth daily., Disp: , Rfl:  .  aspirin EC 81 MG tablet, Take 81 mg by mouth daily.  , Disp: , Rfl:  .  atorvastatin (LIPITOR) 20 MG tablet, TAKE 1 TABLET DAILY, Disp: 90 tablet, Rfl: 1 .  cloNIDine (CATAPRES) 0.2 MG tablet, TAKE 1 TABLET TWICE A DAY, Disp: 180 tablet, Rfl: 1 .  glipiZIDE (GLUCOTROL XL) 10 MG 24 hr tablet, Take 1 tablet (10 mg total) by mouth daily with breakfast., Disp: 90 tablet, Rfl: 1 .  hydrALAZINE (APRESOLINE) 25 MG tablet, TAKE 1 TABLET (25 MG TOTAL) BY MOUTH 3 (THREE) TIMES DAILY., Disp: 270 tablet, Rfl: 3 .  loratadine (CLARITIN) 10 MG tablet, Take 10 mg by mouth daily., Disp: , Rfl:  .  losartan (COZAAR) 100 MG tablet, Take 1 tablet (100 mg total) by mouth daily., Disp: 90 tablet, Rfl: 1 .  nebivolol (BYSTOLIC) 10 MG tablet, 1 tab po bid (Patient taking differently: Take 10 mg  by mouth 2 (two) times daily. ), Disp: 180 tablet, Rfl: 3   Social History: Reviewed -  reports that she has never smoked. She has never used smokeless tobacco.  Objective Findings:  Vitals: Blood pressure 140/90, height 5\' 3"  (1.6 m), weight 186 lb 8 oz (84.596 kg).  Physical Examination: General appearance - alert, well appearing, and in no distress, oriented to person, place, and time and overweight Mental status - alert, oriented to person, place, and time, normal mood, behavior, speech, dress, motor activity, and thought processes, affect appropriate to mood Pelvic -  VULVA: normal appearing vulva with no masses, tenderness or lesions,  VAGINA: normal appearing vagina with normal color, generous amount of clear secretions, good vaginal length, well-supported CERVIX: surgically absent  KOH:  Negative  Wet Prep Epithelial: rare WBC: negative Trichomonas: negative   Assessment & Plan:   A:  1. Physiological vaginal secretions.  P:  1. Recommended trying vinegar water douche once a day. 2. Consider metrogel prn. 3. Follow up prn.   By signing my name below, I, Stephania Fragmin, attest that this documentation has been prepared under the direction and in the presence of Jonnie Kind, MD. Electronically Signed: Stephania Fragmin, ED Scribe. 12/30/2015. 9:30 AM.  .js

## 2015-12-30 NOTE — Progress Notes (Signed)
Patient ID: Megan [name], female   DOB: Nov 17, 1960, 55 y.o.   MRN: [id number] Pt here today for "leakage". Pt states that she has noticed the leakage for about 6 months , pt states that the leakage has not gotten any worse. Pt states that she is just concerned and wants to get things taken care of.

## 2016-01-01 LAB — GC/CHLAMYDIA PROBE AMP
Chlamydia trachomatis, NAA: NEGATIVE
Neisseria gonorrhoeae by PCR: NEGATIVE

## 2016-01-11 ENCOUNTER — Telehealth: Payer: Self-pay | Admitting: Obstetrics and Gynecology

## 2016-01-11 MED ORDER — FLUCONAZOLE 150 MG PO TABS: 150.0000 mg | ORAL_TABLET | Freq: Once | ORAL | Status: DC

## 2016-01-11 NOTE — Telephone Encounter (Signed)
Spoke with Dr. Glo Herring and he gave a verbal order for Diflucan 150mg  take one now and repeat in 3 days if needed. I LMOM for pt to check with her pharmacy and to call us back if she has any questions.

## 2016-02-29 ENCOUNTER — Other Ambulatory Visit: Payer: Self-pay | Admitting: Family Medicine

## 2016-03-23 ENCOUNTER — Encounter: Payer: Self-pay | Admitting: Family Medicine

## 2016-03-23 ENCOUNTER — Ambulatory Visit (INDEPENDENT_AMBULATORY_CARE_PROVIDER_SITE_OTHER): Admitting: Family Medicine

## 2016-03-23 VITALS — BP 172/100 | HR 80 | Temp 97.6°F | Resp 16 | Ht 62.0 in | Wt 188.0 lb

## 2016-03-23 DIAGNOSIS — M5431 Sciatica, right side: Secondary | ICD-10-CM | POA: Diagnosis not present

## 2016-03-23 MED ORDER — PREDNISONE 20 MG PO TABS: ORAL_TABLET | ORAL | 0 refills | Status: DC

## 2016-03-23 MED ORDER — GABAPENTIN 300 MG PO CAPS: 300.0000 mg | ORAL_CAPSULE | Freq: Three times a day (TID) | ORAL | 3 refills | Status: DC

## 2016-03-23 NOTE — Progress Notes (Signed)
Subjective:    Patient ID: Megan [name], female    DOB: June 03, 1961, 55 y.o.   MRN: [id number]  HPI  08/2015 Patient is here today for general checkup. She has a history of hyperlipidemia, hypertension, type 2 diabetes, and stage IV chronic kidney disease. Her blood pressure today is borderline at 140/90 but for this patient has actually good. She denies any chest pain shortness of breath or dyspnea on exertion. She is currently on glipizide for diabetes. She denies any hypoglycemic episodes. She is not checking her sugars regularly. She denies any blurred vision, polyuria, polydipsia. She denies any myalgias or right upper quadrant pain. She is scheduling her mammogram for this month. She is due for a flu shot and pneumonia shot. She does not want the flu shot. I discussed both of these today with her at length and she is still unsure whether or not she wants the vaccinations. Eye exam was performed last year in February. Diabetic foot exam is performed today  At that time, my plan was: Eye exam is up-to-date. I recommended a flu shot as well as Pneumovax 23 but the patient is unsure and does not want him today. I recommended an aspirin 81 mg by mouth daily. Patient will schedule her mammogram. Blood pressures acceptable. I will check a fasting lipid panel. Goal LDL cholesterol is less than 100. I will check a hemoglobin A1c. Goal hemoglobin A1c is less than 6.5. If patient changes her mind she is welcome to receive the flu shot or Pneumovax 23 at her convenience.  03/23/16 Reports a 2 week history of burning pain in the right side of her leg from above her knee over the lateral aspect all the way down the lateral aspect of her shin into her right foot. She states that it feels like her leg is on fire. She is also having some low back pain. She denies any injuries. Her blood pressure is extremely high today but she admits that she is missing several doses of her hydralazine and is not taking it 3  times a day like she is supposed to Past Medical History:  Diagnosis Date  . CKD (chronic kidney disease) stage 3, GFR 30-59 ml/min   . Essential hypertension, benign   . GERD (gastroesophageal reflux disease)   . Hemorrhoids   . Mixed hyperlipidemia   . S/P colonoscopy Jan 2011   Dr. Benson Norway: sessile polyp (benign lymphoid), large hemorrhoids, repeat 5-10 years  . Type 2 diabetes mellitus ([place])    Past Surgical History:  Procedure Laterality Date  . ABDOMINAL HYSTERECTOMY    . CHOLECYSTECTOMY  09/29/2011   Procedure: LAPAROSCOPIC CHOLECYSTECTOMY;  Surgeon: Jamesetta So, MD;  Location: [facility];  Service: General;  Laterality: N/A;  . COLONOSCOPY  Jan 2011   Dr. Benson Norway: sessile polyp (benign lymphoid), large hemorrhoids, repeat 5-10 years  . ESOPHAGOGASTRODUODENOSCOPY  09/05/2011   [id number] hiatal hernia; remainder of exam normal. No explanation for patient's abdominal pain with today's examination  . ESOPHAGOGASTRODUODENOSCOPY N/A 12/17/2013   Procedure: ESOPHAGOGASTRODUODENOSCOPY (EGD);  Surgeon: Daneil Dolin, MD;  Location: [facility];  Service: Endoscopy;  Laterality: N/A;  9:30  . LAPAROSCOPIC APPENDECTOMY  09/29/2011   Procedure: APPENDECTOMY LAPAROSCOPIC;  Surgeon: Jamesetta So, MD;  Location: [facility];  Service: General;;  incidental appendectomy   Current Outpatient Prescriptions on File Prior to Visit  Medication Sig Dispense Refill  . amLODipine (NORVASC) 5 MG tablet Take 5 mg by mouth daily.    Marland Kitchen  aspirin EC 81 MG tablet Take 81 mg by mouth daily.      Marland Kitchen atorvastatin (LIPITOR) 20 MG tablet TAKE 1 TABLET DAILY 90 tablet 1  . cloNIDine (CATAPRES) 0.2 MG tablet TAKE 1 TABLET TWICE A DAY 180 tablet 1  . fluconazole (DIFLUCAN) 150 MG tablet Take 1 tablet (150 mg total) by mouth once. Take one tablet today and repeat in 3 days if needed. 2 tablet 1  . glipiZIDE (GLUCOTROL XL) 10 MG 24 hr tablet Take 1 tablet (10 mg total) by mouth daily with breakfast. 90 tablet 1  . hydrALAZINE  (APRESOLINE) 25 MG tablet TAKE 1 TABLET (25 MG TOTAL) BY MOUTH 3 (THREE) TIMES DAILY. 270 tablet 3  . loratadine (CLARITIN) 10 MG tablet Take 10 mg by mouth daily.    Marland Kitchen losartan (COZAAR) 100 MG tablet TAKE 1 TABLET DAILY 90 tablet 3  . metroNIDAZOLE (METROGEL) 0.75 % vaginal gel Place 1 Applicatorful vaginally at bedtime. Apply one applicatorful to vagina at bedtime for 5 days 70 g 1  . nebivolol (BYSTOLIC) 10 MG tablet 1 tab po bid (Patient taking differently: Take 10 mg by mouth 2 (two) times daily. ) 180 tablet 3   No current facility-administered medications on file prior to visit.    No Known Allergies Social History   Social History  . Marital status: Married    Spouse name: N/A  . Number of children: N/A  . Years of education: N/A   Occupational History  . [employer] # [phone]   Social History Main Topics  . Smoking status: Never Smoker  . Smokeless tobacco: Never Used  . Alcohol use No  . Drug use: No  . Sexual activity: Yes    Birth control/ protection: Surgical   Other Topics Concern  . Not on file   Social History Narrative   Works at [employer] in [place].    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   Also has to get items from high shelves-causes achy pain in shoulder area      Married.   Children are grown, out of house.     Review of Systems  All other systems reviewed and are negative.      Objective:   Physical Exam  Constitutional: She appears well-developed and well-nourished.  Neck: Neck supple. No JVD present. No thyromegaly present.  Cardiovascular: Normal rate, regular rhythm, normal heart sounds and intact distal pulses.  Exam reveals no gallop and no friction rub.   No murmur heard. Pulmonary/Chest: Effort normal and breath sounds normal. No respiratory distress. She has no wheezes. She has no rales.  Abdominal: Soft. Bowel sounds are normal. She exhibits no distension. There is no tenderness. There is no rebound and no  guarding.  Musculoskeletal: She exhibits no edema.  Vitals reviewed.         Assessment & Plan:  Right sided sciatica - Plan: predniSONE (DELTASONE) 20 MG tablet, gabapentin (NEURONTIN) 300 MG capsule  Differential diagnosis includes sciatica versus diabetic neuropathy. Try prednisone taper pack with gabapentin 300 mg 3 times a day and recheck in one week. If no better proceed with an MRI of the lumbar spine. Strongly encouraged the patient take hydralazine 3 times a day. Her kidney function has reached stage IV. I explained to her blood pressure stays out of control she will likely require dialysis. I will recheck her blood pressure in one week on hydralazine 3 times a day

## 2016-03-24 ENCOUNTER — Telehealth: Payer: Self-pay | Admitting: Family Medicine

## 2016-03-24 NOTE — Telephone Encounter (Signed)
We could decrease it to 100 mg potid prn.

## 2016-03-24 NOTE — Telephone Encounter (Signed)
LMTRC

## 2016-03-24 NOTE — Telephone Encounter (Signed)
Pt called [facility] stating that the medication that was given yesterday is making her too drowsy and would like to know can it be changed?

## 2016-03-27 MED ORDER — GABAPENTIN 100 MG PO CAPS: 100.0000 mg | ORAL_CAPSULE | Freq: Three times a day (TID) | ORAL | 3 refills | Status: DC

## 2016-03-27 NOTE — Telephone Encounter (Signed)
Patient aware of providers recommendations and med sent to pharm 

## 2016-03-30 ENCOUNTER — Ambulatory Visit (INDEPENDENT_AMBULATORY_CARE_PROVIDER_SITE_OTHER): Admitting: Family Medicine

## 2016-03-30 VITALS — BP 156/100 | HR 80 | Temp 98.0°F | Resp 16 | Ht 62.0 in | Wt 194.0 lb

## 2016-03-30 DIAGNOSIS — I10 Essential (primary) hypertension: Secondary | ICD-10-CM | POA: Diagnosis not present

## 2016-03-30 DIAGNOSIS — M5431 Sciatica, right side: Secondary | ICD-10-CM

## 2016-03-30 DIAGNOSIS — N184 Chronic kidney disease, stage 4 (severe): Secondary | ICD-10-CM

## 2016-03-30 LAB — COMPLETE METABOLIC PANEL WITH GFR
ALT: 17 U/L (ref 6–29)
AST: 10 U/L (ref 10–35)
Albumin: 3.1 g/dL — ABNORMAL LOW (ref 3.6–5.1)
Alkaline Phosphatase: 84 U/L (ref 33–130)
BUN: 86 mg/dL — ABNORMAL HIGH (ref 7–25)
CO2: 21 mmol/L (ref 20–31)
Calcium: 8.6 mg/dL (ref 8.6–10.4)
Chloride: 111 mmol/L — ABNORMAL HIGH (ref 98–110)
Creat: 5.35 mg/dL — ABNORMAL HIGH (ref 0.50–1.05)
GFR, Est African American: 10 mL/min — ABNORMAL LOW (ref >=60)
GFR, Est Non African American: 8 mL/min — ABNORMAL LOW (ref >=60)
Glucose, Bld: 82 mg/dL (ref 70–99)
Potassium: 4.8 mmol/L (ref 3.5–5.3)
Sodium: 142 mmol/L (ref 135–146)
Total Bilirubin: 0.3 mg/dL (ref 0.2–1.2)
Total Protein: 6.1 g/dL (ref 6.1–8.1)

## 2016-03-30 LAB — CBC WITH DIFFERENTIAL/PLATELET
Basophils Absolute: 0 cells/uL (ref 0–200)
Basophils Relative: 0 %
Eosinophils Absolute: 381 cells/uL (ref 15–500)
Eosinophils Relative: 3 %
HCT: 34.1 % — ABNORMAL LOW (ref 35.0–45.0)
Hemoglobin: 11.2 g/dL — ABNORMAL LOW (ref 12.0–15.0)
Lymphocytes Relative: 29 %
Lymphs Abs: 3683 cells/uL (ref 850–3900)
MCH: 28.5 pg (ref 27.0–33.0)
MCHC: 32.8 g/dL (ref 32.0–36.0)
MCV: 86.8 fL (ref 80.0–100.0)
MPV: 10.1 fL (ref 7.5–12.5)
Monocytes Absolute: 889 cells/uL (ref 200–950)
Monocytes Relative: 7 %
Neutro Abs: 7747 cells/uL (ref 1500–7800)
Neutrophils Relative %: 61 %
Platelets: 410 10*3/uL — ABNORMAL HIGH (ref 140–400)
RBC: 3.93 MIL/uL (ref 3.80–5.10)
RDW: 17.7 % — ABNORMAL HIGH (ref 11.0–15.0)
WBC: 12.7 10*3/uL — ABNORMAL HIGH (ref 3.8–10.8)

## 2016-03-30 MED ORDER — HYDROCODONE-ACETAMINOPHEN 10-325 MG PO TABS: 1.0000 | ORAL_TABLET | Freq: Three times a day (TID) | ORAL | 0 refills | Status: DC | PRN

## 2016-03-30 NOTE — Progress Notes (Signed)
Subjective:    Patient ID: Megan [name], female    DOB: 09-26-1960, 55 y.o.   MRN: [id number]  HPI  08/2015 Patient is here today for general checkup. She has a history of hyperlipidemia, hypertension, type 2 diabetes, and stage IV chronic kidney disease. Her blood pressure today is borderline at 140/90 but for this patient has actually good. She denies any chest pain shortness of breath or dyspnea on exertion. She is currently on glipizide for diabetes. She denies any hypoglycemic episodes. She is not checking her sugars regularly. She denies any blurred vision, polyuria, polydipsia. She denies any myalgias or right upper quadrant pain. She is scheduling her mammogram for this month. She is due for a flu shot and pneumonia shot. She does not want the flu shot. I discussed both of these today with her at length and she is still unsure whether or not she wants the vaccinations. Eye exam was performed last year in February. Diabetic foot exam is performed today  At that time, my plan was: Eye exam is up-to-date. I recommended a flu shot as well as Pneumovax 23 but the patient is unsure and does not want him today. I recommended an aspirin 81 mg by mouth daily. Patient will schedule her mammogram. Blood pressures acceptable. I will check a fasting lipid panel. Goal LDL cholesterol is less than 100. I will check a hemoglobin A1c. Goal hemoglobin A1c is less than 6.5. If patient changes her mind she is welcome to receive the flu shot or Pneumovax 23 at her convenience.  03/23/16 Reports a 2 week history of burning pain in the right side of her leg from above her knee over the lateral aspect all the way down the lateral aspect of her shin into her right foot. She states that it feels like her leg is on fire. She is also having some low back pain. She denies any injuries. Her blood pressure is extremely high today but she admits that she is missing several doses of her hydralazine and is not taking it 3  times a day like she is supposed to.  At that time, my plan was: Differential diagnosis includes sciatica versus diabetic neuropathy. Try prednisone taper pack with gabapentin 300 mg 3 times a day and recheck in one week. If no better proceed with an MRI of the lumbar spine. Strongly encouraged the patient take hydralazine 3 times a day. Her kidney function has reached stage IV. I explained to her blood pressure stays out of control she will likely require dialysis. I will recheck her blood pressure in one week on hydralazine 3 times a day  03/30/16 Ultimately, we had to decrease gabapentin to 100 mg 3 times a day due to sedation. She is here today to recheck her blood pressure.  Unfortunately, the prednisone in the gabapentin did not help at all with her back pain in her right sided sciatica. She continues to complain of pain radiating down her right leg. The medications did cause her to retain fluid and gaining weight. Her blood pressures at home have been averaging between [id number] and [id number] systolic. However her blood pressure this morning was in the [id number] to [id number] systolic. She tells me that she is also been taking ibuprofen quite regularly for headaches and back pain. Her creatinine in January was 3.2. Past Medical History:  Diagnosis Date  . CKD (chronic kidney disease) stage 3, GFR 30-59 ml/min   . Essential hypertension, benign   . GERD (gastroesophageal reflux  disease)   . Hemorrhoids   . Mixed hyperlipidemia   . S/P colonoscopy Jan 2011   Dr. Benson Norway: sessile polyp (benign lymphoid), large hemorrhoids, repeat 5-10 years  . Type 2 diabetes mellitus ([place])    Past Surgical History:  Procedure Laterality Date  . ABDOMINAL HYSTERECTOMY    . CHOLECYSTECTOMY  09/29/2011   Procedure: LAPAROSCOPIC CHOLECYSTECTOMY;  Surgeon: Jamesetta So, MD;  Location: [facility];  Service: General;  Laterality: N/A;  . COLONOSCOPY  Jan 2011   Dr. Benson Norway: sessile polyp (benign lymphoid), large hemorrhoids, repeat 5-10 years    . ESOPHAGOGASTRODUODENOSCOPY  09/05/2011   [id number] hiatal hernia; remainder of exam normal. No explanation for patient's abdominal pain with today's examination  . ESOPHAGOGASTRODUODENOSCOPY N/A 12/17/2013   Procedure: ESOPHAGOGASTRODUODENOSCOPY (EGD);  Surgeon: Daneil Dolin, MD;  [facility];  Service: Endoscopy;  Laterality: N/A;  9:30  . LAPAROSCOPIC APPENDECTOMY  09/29/2011   Procedure: APPENDECTOMY LAPAROSCOPIC;  Surgeon: Jamesetta So, MD;  Location: [facility];  Service: General;;  incidental appendectomy   Current Outpatient Prescriptions on File Prior to Visit  Medication Sig Dispense Refill  . amLODipine (NORVASC) 5 MG tablet Take 5 mg by mouth daily.    Marland Kitchen aspirin EC 81 MG tablet Take 81 mg by mouth daily.      Marland Kitchen atorvastatin (LIPITOR) 20 MG tablet TAKE 1 TABLET DAILY 90 tablet 1  . cloNIDine (CATAPRES) 0.2 MG tablet TAKE 1 TABLET TWICE A DAY 180 tablet 1  . fluconazole (DIFLUCAN) 150 MG tablet Take 1 tablet (150 mg total) by mouth once. Take one tablet today and repeat in 3 days if needed. 2 tablet 1  . gabapentin (NEURONTIN) 100 MG capsule Take 1 capsule (100 mg total) by mouth 3 (three) times daily. 90 capsule 3  . glipiZIDE (GLUCOTROL XL) 10 MG 24 hr tablet Take 1 tablet (10 mg total) by mouth daily with breakfast. 90 tablet 1  . hydrALAZINE (APRESOLINE) 25 MG tablet TAKE 1 TABLET (25 MG TOTAL) BY MOUTH 3 (THREE) TIMES DAILY. 270 tablet 3  . loratadine (CLARITIN) 10 MG tablet Take 10 mg by mouth daily.    Marland Kitchen losartan (COZAAR) 100 MG tablet TAKE 1 TABLET DAILY 90 tablet 3  . metroNIDAZOLE (METROGEL) 0.75 % vaginal gel Place 1 Applicatorful vaginally at bedtime. Apply one applicatorful to vagina at bedtime for 5 days 70 g 1  . nebivolol (BYSTOLIC) 10 MG tablet 1 tab po bid (Patient taking differently: Take 10 mg by mouth 2 (two) times daily. ) 180 tablet 3  . predniSONE (DELTASONE) 20 MG tablet 3 tabs poqday 1-2, 2 tabs poqday 3-4, 1 tab poqday 5-6 12 tablet 0   No  current facility-administered medications on file prior to visit.    No Known Allergies Social History   Social History  . Marital status: Married    Spouse name: N/A  . Number of children: N/A  . Years of education: N/A   Occupational History  . [employer] # [phone]   Social History Main Topics  . Smoking status: Never Smoker  . Smokeless tobacco: Never Used  . Alcohol use No  . Drug use: No  . Sexual activity: Yes    Birth control/ protection: Surgical   Other Topics Concern  . Not on file   Social History Narrative   Works at [employer] in [place].    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   Also has to  get items from high shelves-causes achy pain in shoulder area      Married.   Children are grown, out of house.     Review of Systems  All other systems reviewed and are negative.      Objective:   Physical Exam  Constitutional: She appears well-developed and well-nourished.  Neck: Neck supple. No JVD present. No thyromegaly present.  Cardiovascular: Normal rate, regular rhythm, normal heart sounds and intact distal pulses.  Exam reveals no gallop and no friction rub.   No murmur heard. Pulmonary/Chest: Effort normal and breath sounds normal. No respiratory distress. She has no wheezes. She has no rales.  Abdominal: Soft. Bowel sounds are normal. She exhibits no distension. There is no tenderness. There is no rebound and no guarding.  Musculoskeletal: She exhibits no edema.  Vitals reviewed.         Assessment & Plan:  Sciatica associated with disorder of lumbar spine, right - Plan: HYDROcodone-acetaminophen (NORCO) 10-325 MG tablet, CBC with Differential/Platelet, COMPLETE METABOLIC PANEL WITH GFR  Discontinue ibuprofen immediately here and I'm concerned that this could deteriorate her kidney function further. This could potentially cause her to retain fluid and exacerbated her blood pressure. I will give her hydrocodone/acetaminophen  10/325 one every 6 hours as needed for pain. I will schedule her for an MRI of the spine to determine the source of the nerve root impingement. However I'm now worried that her kidney function could be even worse than it was in January due to her use of ibuprofen. Therefore I will repeat this today. I will have the patient check her blood pressure over the weekend and report the values to me next week so that we can determine if she needs more medication to keep her systolic blood pressure in the 130s to prevent worsening of her kidney function.

## 2016-03-31 ENCOUNTER — Other Ambulatory Visit: Payer: Self-pay

## 2016-03-31 DIAGNOSIS — N183 Chronic kidney disease, stage 3 unspecified: Secondary | ICD-10-CM

## 2016-04-07 ENCOUNTER — Other Ambulatory Visit

## 2016-04-07 DIAGNOSIS — N183 Chronic kidney disease, stage 3 unspecified: Secondary | ICD-10-CM

## 2016-04-07 LAB — COMPLETE METABOLIC PANEL WITH GFR
ALT: 18 U/L (ref 6–29)
AST: 14 U/L (ref 10–35)
Albumin: 3.2 g/dL — ABNORMAL LOW (ref 3.6–5.1)
Alkaline Phosphatase: 108 U/L (ref 33–130)
BUN: 56 mg/dL — ABNORMAL HIGH (ref 7–25)
CO2: 19 mmol/L — ABNORMAL LOW (ref 20–31)
Calcium: 8.6 mg/dL (ref 8.6–10.4)
Chloride: 107 mmol/L (ref 98–110)
Creat: 4.62 mg/dL — ABNORMAL HIGH (ref 0.50–1.05)
GFR, Est African American: 12 mL/min — ABNORMAL LOW (ref >=60)
GFR, Est Non African American: 10 mL/min — ABNORMAL LOW (ref >=60)
Glucose, Bld: 100 mg/dL — ABNORMAL HIGH (ref 70–99)
Potassium: 4.4 mmol/L (ref 3.5–5.3)
Sodium: 139 mmol/L (ref 135–146)
Total Bilirubin: 0.3 mg/dL (ref 0.2–1.2)
Total Protein: 6 g/dL — ABNORMAL LOW (ref 6.1–8.1)

## 2016-04-07 LAB — CBC WITH DIFFERENTIAL/PLATELET
Basophils Absolute: 0 cells/uL (ref 0–200)
Basophils Relative: 0 %
Eosinophils Absolute: 264 cells/uL (ref 15–500)
Eosinophils Relative: 3 %
HCT: 32.4 % — ABNORMAL LOW (ref 35.0–45.0)
Hemoglobin: 10.4 g/dL — ABNORMAL LOW (ref 12.0–15.0)
Lymphocytes Relative: 27 %
Lymphs Abs: 2376 cells/uL (ref 850–3900)
MCH: 28.1 pg (ref 27.0–33.0)
MCHC: 32.1 g/dL (ref 32.0–36.0)
MCV: 87.6 fL (ref 80.0–100.0)
MPV: 9.8 fL (ref 7.5–12.5)
Monocytes Absolute: 704 cells/uL (ref 200–950)
Monocytes Relative: 8 %
Neutro Abs: 5456 cells/uL (ref 1500–7800)
Neutrophils Relative %: 62 %
Platelets: 285 10*3/uL (ref 140–400)
RBC: 3.7 MIL/uL — ABNORMAL LOW (ref 3.80–5.10)
RDW: 16.6 % — ABNORMAL HIGH (ref 11.0–15.0)
WBC: 8.8 10*3/uL (ref 3.8–10.8)

## 2016-04-12 ENCOUNTER — Telehealth: Payer: Self-pay

## 2016-04-12 NOTE — Telephone Encounter (Signed)
-----   Message from [name], MD sent at 04/10/2016  6:57 AM EDT ----- Real fcn has improved slightly but I want her to see her nephrologist for a recheck as soon as possible as her renal fcn appears to be approaching levels requiring dialysis.

## 2016-04-12 NOTE — Telephone Encounter (Signed)
Called patient to give lab results. No answer. 

## 2016-04-19 ENCOUNTER — Ambulatory Visit (INDEPENDENT_AMBULATORY_CARE_PROVIDER_SITE_OTHER): Admitting: Physician Assistant

## 2016-04-19 ENCOUNTER — Encounter: Payer: Self-pay | Admitting: Physician Assistant

## 2016-04-19 VITALS — BP 159/98 | HR 62 | Temp 97.4°F | Resp 18

## 2016-04-19 DIAGNOSIS — E1122 Type 2 diabetes mellitus with diabetic chronic kidney disease: Secondary | ICD-10-CM | POA: Diagnosis not present

## 2016-04-19 DIAGNOSIS — L089 Local infection of the skin and subcutaneous tissue, unspecified: Secondary | ICD-10-CM

## 2016-04-19 DIAGNOSIS — N183 Type 2 diabetes mellitus with diabetic chronic kidney disease: Secondary | ICD-10-CM

## 2016-04-19 MED ORDER — CEPHALEXIN 500 MG PO CAPS: 500.0000 mg | ORAL_CAPSULE | Freq: Four times a day (QID) | ORAL | 0 refills | Status: DC

## 2016-04-19 MED ORDER — FLUCONAZOLE 150 MG PO TABS: 150.0000 mg | ORAL_TABLET | Freq: Once | ORAL | 1 refills | Status: AC

## 2016-04-19 MED ORDER — NEBIVOLOL HCL 10 MG PO TABS: ORAL_TABLET | ORAL | 3 refills | Status: DC

## 2016-04-19 MED ORDER — AMLODIPINE BESYLATE 5 MG PO TABS: 5.0000 mg | ORAL_TABLET | Freq: Every day | ORAL | 1 refills | Status: DC

## 2016-04-19 NOTE — Progress Notes (Signed)
Patient ID: KOLE TROIA MRN: [id number], DOB: 1960-09-15, 55 y.o. Date of Encounter: 04/19/2016, 3:37 PM    Chief Complaint:  Chief Complaint  Patient presents with  . office visit    infected left big toe painful      HPI: 55 y.o. year old AA female presents with above.   Says that she just recently noticed some pus draining out of the edge of her toenail of her left first toe.  Doesn't know if it is from having recent pedicure or what caused it. No other complaints or concerns.     Home Meds:   Outpatient Medications Prior to Visit  Medication Sig Dispense Refill  . aspirin EC 81 MG tablet Take 81 mg by mouth daily.      Marland Kitchen atorvastatin (LIPITOR) 20 MG tablet TAKE 1 TABLET DAILY 90 tablet 1  . cloNIDine (CATAPRES) 0.2 MG tablet TAKE 1 TABLET TWICE A DAY 180 tablet 1  . glipiZIDE (GLUCOTROL XL) 10 MG 24 hr tablet Take 1 tablet (10 mg total) by mouth daily with breakfast. 90 tablet 1  . hydrALAZINE (APRESOLINE) 25 MG tablet TAKE 1 TABLET (25 MG TOTAL) BY MOUTH 3 (THREE) TIMES DAILY. 270 tablet 3  . loratadine (CLARITIN) 10 MG tablet Take 10 mg by mouth daily.    Marland Kitchen losartan (COZAAR) 100 MG tablet TAKE 1 TABLET DAILY 90 tablet 3  . nebivolol (BYSTOLIC) 10 MG tablet 1 tab po bid (Patient taking differently: Take 10 mg by mouth 2 (two) times daily. ) 180 tablet 3  . gabapentin (NEURONTIN) 100 MG capsule Take 1 capsule (100 mg total) by mouth 3 (three) times daily. (Patient not taking: Reported on 04/19/2016) 90 capsule 3  . HYDROcodone-acetaminophen (NORCO) 10-325 MG tablet Take 1 tablet by mouth every 8 (eight) hours as needed. (Patient not taking: Reported on 04/19/2016) 30 tablet 0  . metroNIDAZOLE (METROGEL) 0.75 % vaginal gel Place 1 Applicatorful vaginally at bedtime. Apply one applicatorful to vagina at bedtime for 5 days (Patient not taking: Reported on 04/19/2016) 70 g 1  . amLODipine (NORVASC) 5 MG tablet Take 5 mg by mouth daily.    . fluconazole (DIFLUCAN) 150 MG tablet  Take 1 tablet (150 mg total) by mouth once. Take one tablet today and repeat in 3 days if needed. (Patient not taking: Reported on 04/19/2016) 2 tablet 1   No facility-administered medications prior to visit.     Allergies: No Known Allergies    Review of Systems: See HPI for pertinent ROS. All other ROS negative.    Physical Exam: Blood pressure (!) 159/98, pulse 62, temperature 97.4 F (36.3 C), temperature source Oral, resp. rate 18., There is no height or weight on file to calculate BMI. General:  AAF. Appears in no acute distress. Neck: Supple. No thyromegaly. No lymphadenopathy. Lungs: Clear bilaterally to auscultation without wheezes, rales, or rhonchi. Breathing is unlabored. Heart: Regular rhythm. No murmurs, rubs, or gallops. Msk:  Strength and tone normal for age. Extremities/Skin: Left 1st Toe: Medial edge of toenail/skin---at present there is tiny drop of clear serous drainage present. Even with palpation, getting no thick purulent drainage to come out of site. There is no erythema. No abscess.  Neuro: Alert and oriented X 3. Moves all extremities spontaneously. Gait is normal. CNII-XII grossly in tact. Psych:  Responds to questions appropriately with a normal affect.     ASSESSMENT AND PLAN:  55 y.o. year old female with  1. Toe infection I obtained culture  though the site is currently pretty dry.  She is to start the Keflex immediately and take as directed. I told her to monitor her toe twice a day. If the site worsens or is not resolved at completion of antibiotics, then follow-up. She voices understanding and agrees. - CULTURE, ROUTINE-ABSCESS - cephALEXin (KEFLEX) 500 MG capsule; Take 1 capsule (500 mg total) by mouth 4 (four) times daily.  Dispense: 28 capsule; Refill: 0 - fluconazole (DIFLUCAN) 150 MG tablet; Take 1 tablet (150 mg total) by mouth once. Take one tablet today and repeat in 3 days if needed.  Dispense: 2 tablet; Refill: 1  2. Type 2 diabetes mellitus  with stage 3 chronic kidney disease, without long-term current use of insulin [facility])    Signed, [address] [place], [place], [facility] 04/19/2016 3:37 PM

## 2016-04-22 LAB — CULTURE, ROUTINE-ABSCESS
Gram Stain: NONE SEEN
Gram Stain: NONE SEEN
Gram Stain: NONE SEEN
Organism ID, Bacteria: NORMAL

## 2016-04-25 ENCOUNTER — Ambulatory Visit (HOSPITAL_COMMUNITY)
Admission: RE | Admit: 2016-04-25 | Discharge: 2016-04-25 | Disposition: A | Discharge status: Home / Self Care | Source: Ambulatory Visit | Attending: Family Medicine | Admitting: Family Medicine

## 2016-04-25 DIAGNOSIS — N289 Disorder of kidney and ureter, unspecified: Secondary | ICD-10-CM | POA: Insufficient documentation

## 2016-04-25 DIAGNOSIS — M461 Sacroiliitis, not elsewhere classified: Secondary | ICD-10-CM | POA: Insufficient documentation

## 2016-04-25 DIAGNOSIS — M5136 Other intervertebral disc degeneration, lumbar region: Secondary | ICD-10-CM | POA: Insufficient documentation

## 2016-04-25 DIAGNOSIS — M47896 Other spondylosis, lumbar region: Secondary | ICD-10-CM | POA: Insufficient documentation

## 2016-04-25 DIAGNOSIS — M5431 Sciatica, right side: Secondary | ICD-10-CM | POA: Insufficient documentation

## 2016-05-01 ENCOUNTER — Encounter: Payer: Self-pay | Admitting: Family Medicine

## 2016-05-10 ENCOUNTER — Other Ambulatory Visit: Payer: Self-pay | Admitting: Family Medicine

## 2016-05-10 DIAGNOSIS — M5126 Other intervertebral disc displacement, lumbar region: Secondary | ICD-10-CM

## 2016-05-10 DIAGNOSIS — M5136 Other intervertebral disc degeneration, lumbar region: Secondary | ICD-10-CM

## 2016-05-12 MED ORDER — CLONIDINE HCL 0.2 MG PO TABS: 0.2000 mg | ORAL_TABLET | Freq: Two times a day (BID) | ORAL | 3 refills | Status: DC

## 2016-06-08 ENCOUNTER — Ambulatory Visit (INDEPENDENT_AMBULATORY_CARE_PROVIDER_SITE_OTHER): Admitting: Gastroenterology

## 2016-06-08 ENCOUNTER — Encounter: Payer: Self-pay | Admitting: Gastroenterology

## 2016-06-08 ENCOUNTER — Other Ambulatory Visit: Payer: Self-pay

## 2016-06-08 DIAGNOSIS — K625 Hemorrhage of anus and rectum: Secondary | ICD-10-CM

## 2016-06-08 LAB — CBC
HCT: 31.5 % — ABNORMAL LOW (ref 35.0–45.0)
Hemoglobin: 10.3 g/dL — ABNORMAL LOW (ref 11.7–15.5)
MCH: 28.2 pg (ref 27.0–33.0)
MCHC: 32.7 g/dL (ref 32.0–36.0)
MCV: 86.3 fL (ref 80.0–100.0)
MPV: 9.5 fL (ref 7.5–12.5)
Platelets: 339 10*3/uL (ref 140–400)
RBC: 3.65 MIL/uL — ABNORMAL LOW (ref 3.80–5.10)
RDW: 16.6 % — ABNORMAL HIGH (ref 11.0–15.0)
WBC: 8.7 10*3/uL (ref 3.8–10.8)

## 2016-06-08 LAB — FERRITIN: Ferritin: 126 ng/mL (ref 10–232)

## 2016-06-08 MED ORDER — HYDROCORTISONE 2.5 % RE CREA: 1.0000 "application " | TOPICAL_CREAM | Freq: Two times a day (BID) | RECTAL | 1 refills | Status: DC

## 2016-06-08 NOTE — Patient Instructions (Addendum)
Start using the Anusol cream twice a day for 7 days. Take a break, and then you may use again if needed for another round.  Please review the hemorrhoid handout. Avoid straining, do not sit on the toilet until you are ready to have a bowel movement. I have given you samples called Linzess to take once each morning, 30 minutes before breakfast. If this is too strong, let me know.  Please have blood work done today.  We have scheduled you for a colonoscopy with Dr. [name] in the near future.    Hemorrhoids Hemorrhoids are puffy (swollen) veins around the rectum or anus. Hemorrhoids can cause pain, itching, bleeding, or irritation. HOME CARE  Eat foods with fiber, such as whole grains, beans, nuts, fruits, and vegetables. Ask your doctor about taking products with added fiber in them (fibersupplements).  Drink enough fluid to keep your pee (urine) clear or pale yellow.  Exercise often.  Go to the bathroom when you have the urge to poop. Do not wait.  Avoid straining to poop (bowel movement).  Keep the butt area dry and clean. Use wet toilet paper or moist paper towels.  Medicated creams and medicine inserted into the anus (anal suppository) may be used or applied as told.  Only take medicine as told by your doctor.  Take a warm water bath (sitz bath) for 15-20 minutes to ease pain. Do this 3-4 times a day.  Place ice packs on the area if it is tender or puffy. Use the ice packs between the warm water baths.  Put ice in a plastic bag.  Place a towel between your skin and the bag.  Leave the ice on for 15-20 minutes, 03-04 times a day.  Do not use a donut-shaped pillow or sit on the toilet for a long time. GET HELP RIGHT AWAY IF:   You have more pain that is not controlled by treatment or medicine.  You have bleeding that will not stop.  You have trouble or are unable to poop (bowel movement).  You have pain or puffiness outside the area of the hemorrhoids. MAKE SURE YOU:    Understand these instructions.  Will watch your condition.  Will get help right away if you are not doing well or get worse.   This information is not intended to replace advice given to you by your health care provider. Make sure you discuss any questions you have with your health care provider.   Document Released: 05/09/2008 Document Revised: 07/17/2012 Document Reviewed: 06/11/2012 Elsevier Interactive Patient Education [employer].

## 2016-06-08 NOTE — Progress Notes (Signed)
Referring Provider: [name], MD Primary Care Physician:  Odette Fraction, MD  Primary GI: Dr. [name]   Chief Complaint  Patient presents with  . Hemorrhoids    started 10/23, bleeding  . Constipation    HPI:   Megan [name] is a 55 y.o. female presenting today with rectal bleeding, last seen in 2015. Last colonoscopy in 2011 by Dr. Benson Norway with large hemorrhoids and sessile polyp (path stating benign lymphoid).  Had episode of constipation then had a BM and had rectal bleeding. Has been constipated for a few weeks, after eating more healthy. Hasn't had a BM since Monday, only drips of blood. Some mild lower abdominal discomfort. Notes rectal discomfort. No creams. No N/V. No fever/chills.   Past Medical History:  Diagnosis Date  . CKD (chronic kidney disease) stage 3, GFR 30-59 ml/min   . Essential hypertension, benign   . GERD (gastroesophageal reflux disease)   . Hemorrhoids   . Mixed hyperlipidemia   . S/P colonoscopy Jan 2011   Dr. Benson Norway: sessile polyp (benign lymphoid), large hemorrhoids, repeat 5-10 years  . Type 2 diabetes mellitus ([place])     Past Surgical History:  Procedure Laterality Date  . ABDOMINAL HYSTERECTOMY    . CHOLECYSTECTOMY  09/29/2011   Procedure: LAPAROSCOPIC CHOLECYSTECTOMY;  Surgeon: Jamesetta So, MD;  Location: [facility];  Service: General;  Laterality: N/A;  . COLONOSCOPY  Jan 2011   Dr. Benson Norway: sessile polyp (benign lymphoid), large hemorrhoids, repeat 5-10 years  . ESOPHAGOGASTRODUODENOSCOPY  09/05/2011   [id number] hiatal hernia; remainder of exam normal. No explanation for patient's abdominal pain with today's examination  . ESOPHAGOGASTRODUODENOSCOPY N/A 12/17/2013   Dr. [name]: gastric erythema, erosion, mild chronic inflammation on path   . LAPAROSCOPIC APPENDECTOMY  09/29/2011   Procedure: APPENDECTOMY LAPAROSCOPIC;  Surgeon: Jamesetta So, MD;  Location: [facility];  Service: General;;  incidental appendectomy    Current Outpatient  Prescriptions  Medication Sig Dispense Refill  . amLODipine (NORVASC) 5 MG tablet Take 1 tablet (5 mg total) by mouth daily. 90 tablet 1  . aspirin EC 81 MG tablet Take 81 mg by mouth daily.      Marland Kitchen atorvastatin (LIPITOR) 20 MG tablet TAKE 1 TABLET DAILY 90 tablet 1  . cloNIDine (CATAPRES) 0.2 MG tablet Take 1 tablet (0.2 mg total) by mouth 2 (two) times daily. (Patient taking differently: Take 0.2 mg by mouth 3 (three) times daily. ) 180 tablet 3  . gabapentin (NEURONTIN) 100 MG capsule Take 1 capsule (100 mg total) by mouth 3 (three) times daily. 90 capsule 3  . glipiZIDE (GLUCOTROL XL) 10 MG 24 hr tablet Take 1 tablet (10 mg total) by mouth daily with breakfast. 90 tablet 1  . hydrALAZINE (APRESOLINE) 25 MG tablet TAKE 1 TABLET (25 MG TOTAL) BY MOUTH 3 (THREE) TIMES DAILY. 270 tablet 3  . HYDROcodone-acetaminophen (NORCO) 10-325 MG tablet Take 1 tablet by mouth every 8 (eight) hours as needed. 30 tablet 0  . loratadine (CLARITIN) 10 MG tablet Take 10 mg by mouth daily.    Marland Kitchen losartan (COZAAR) 100 MG tablet TAKE 1 TABLET DAILY 90 tablet 3  . nebivolol (BYSTOLIC) 10 MG tablet 1 tab po bid (Patient taking differently: Take 10 mg by mouth 2 (two) times daily. 1 tab po bid) 180 tablet 3  . cephALEXin (KEFLEX) 500 MG capsule Take 1 capsule (500 mg total) by mouth 4 (four) times daily. (Patient not taking: Reported on 06/08/2016) 28 capsule 0  .  metroNIDAZOLE (METROGEL) 0.75 % vaginal gel Place 1 Applicatorful vaginally at bedtime. Apply one applicatorful to vagina at bedtime for 5 days (Patient not taking: Reported on 06/08/2016) 70 g 1   No current facility-administered medications for this visit.     Allergies as of 06/08/2016  . (No Known Allergies)    Family History  Problem Relation Age of Onset  . Hypertension Mother   . Coronary artery disease Mother   . Diabetes Mother   . Hypertension Sister   . Coronary artery disease Sister   . Hypertension Brother   . Colon cancer Neg Hx      Social History   Social History  . Marital status: Married    Spouse name: N/A  . Number of children: N/A  . Years of education: N/A   Occupational History  . [employer] # [id number]   Social History Main Topics  . Smoking status: Never Smoker  . Smokeless tobacco: Never Used  . Alcohol use No  . Drug use: No  . Sexual activity: Yes    Birth control/ protection: Surgical   Other Topics Concern  . None   Social History Narrative   Works at [employer] in [place].    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   Also has to get items from high shelves-causes achy pain in shoulder area      Married.   Children are grown, out of house.    Review of Systems: Gen: Denies fever, chills, anorexia. Denies fatigue, weakness, weight loss.  CV: Denies chest pain, palpitations, syncope, peripheral edema, and claudication. Resp: Denies dyspnea at rest, cough, wheezing, coughing up blood, and pleurisy. GI: see HPI  Derm: Denies rash, itching, dry skin Psych: Denies depression, anxiety, memory loss, confusion. No homicidal or suicidal ideation.  Heme: see HPI   Physical Exam: BP (!) 145/87   Pulse 76   Temp 97.5 F (36.4 C) (Oral)   Ht 5\' 2"  (1.575 m)   Wt 176 lb (79.8 kg)   BMI 32.19 kg/m  General:   Alert and oriented. No distress noted. Pleasant and cooperative.  Head:  Normocephalic and atraumatic. Eyes:  Conjuctiva clear without scleral icterus. Mouth:  Oral mucosa pink and moist. Good dentition. No lesions. Heart:  S1, S2 present without murmurs, rubs, or gallops. Regular rate and rhythm. Abdomen:  +BS, soft, non-tender and non-distended. No rebound or guarding. No HSM or masses noted. Rectal: small prolapsed hemorrhoids non-thrombosed, friable, scant blood noted, no internal exam, no obvious anal fissure  Msk:  Symmetrical without gross deformities. Normal posture. Extremities:  Without edema. Neurologic:  Alert and  oriented x4;  grossly normal  neurologically. Psych:  Alert and cooperative. Normal mood and affect.

## 2016-06-09 LAB — IRON AND TIBC
%SAT: 22 % (ref 11–50)
Iron: 48 ug/dL (ref 45–160)
TIBC: 218 ug/dL — ABNORMAL LOW (ref 250–450)
UIBC: 170 ug/dL (ref 125–400)

## 2016-06-09 NOTE — Assessment & Plan Note (Addendum)
55 year old female presenting with acute onset of rectal bleeding after episode of constipation and straining, with rectal exam revealing likely prolapsed, non-thrombosed internal hemorrhoids as culprit. However, last colonoscopy in 2011 by Dr. Benson Norway. Will have her start Linzess 72 mcg once daily, use Anusol cream per rectum, avoid straining, and proceed with colonoscopy in near future to exclude occult etiology, although I suspect bleeding is hemorrhoidal in nature. May be a candidate for banding in future but will need to await findings from colonoscopy.   Proceed with TCS with Dr. [name] in near future: the risks, benefits, and alternatives have been discussed with the patient in detail. The patient states understanding and desires to proceed. PHENERGAN 25 MG IV ON CALL  Linzess 72 mcg po daily Anusol cream provided

## 2016-06-11 NOTE — Progress Notes (Signed)
CBC reviewed and Hgb stable from baseline at 10.3. Ferritin normal at 126. Appears to have anemia of chronic disease as expected. Proceed with colonoscopy as planned.

## 2016-06-12 ENCOUNTER — Encounter (HOSPITAL_COMMUNITY)
Admission: RE | Disposition: A | Discharge status: Home / Self Care | Payer: Self-pay | Source: Ambulatory Visit | Attending: Internal Medicine

## 2016-06-12 ENCOUNTER — Encounter (HOSPITAL_COMMUNITY): Payer: Self-pay

## 2016-06-12 ENCOUNTER — Ambulatory Visit (HOSPITAL_COMMUNITY)
Admission: RE | Admit: 2016-06-12 | Discharge: 2016-06-12 | Disposition: A | Discharge status: Home / Self Care | Source: Ambulatory Visit | Attending: Internal Medicine | Admitting: Internal Medicine

## 2016-06-12 DIAGNOSIS — K5900 Constipation, unspecified: Secondary | ICD-10-CM | POA: Insufficient documentation

## 2016-06-12 DIAGNOSIS — E1122 Type 2 diabetes mellitus with diabetic chronic kidney disease: Secondary | ICD-10-CM | POA: Diagnosis not present

## 2016-06-12 DIAGNOSIS — N183 Chronic kidney disease, stage 3 (moderate): Secondary | ICD-10-CM | POA: Diagnosis not present

## 2016-06-12 DIAGNOSIS — K921 Melena: Secondary | ICD-10-CM | POA: Diagnosis not present

## 2016-06-12 DIAGNOSIS — E782 Mixed hyperlipidemia: Secondary | ICD-10-CM | POA: Insufficient documentation

## 2016-06-12 DIAGNOSIS — Z7982 Long term (current) use of aspirin: Secondary | ICD-10-CM | POA: Diagnosis not present

## 2016-06-12 DIAGNOSIS — Z9049 Acquired absence of other specified parts of digestive tract: Secondary | ICD-10-CM | POA: Insufficient documentation

## 2016-06-12 DIAGNOSIS — K649 Unspecified hemorrhoids: Secondary | ICD-10-CM | POA: Diagnosis not present

## 2016-06-12 DIAGNOSIS — Z79899 Other long term (current) drug therapy: Secondary | ICD-10-CM | POA: Insufficient documentation

## 2016-06-12 DIAGNOSIS — I129 Hypertensive chronic kidney disease with stage 1 through stage 4 chronic kidney disease, or unspecified chronic kidney disease: Secondary | ICD-10-CM | POA: Diagnosis not present

## 2016-06-12 DIAGNOSIS — K625 Hemorrhage of anus and rectum: Secondary | ICD-10-CM

## 2016-06-12 HISTORY — PX: COLONOSCOPY: SHX5424

## 2016-06-12 LAB — GLUCOSE, CAPILLARY: Glucose-Capillary: 99 mg/dL (ref 65–99)

## 2016-06-12 SURGERY — COLONOSCOPY
Anesthesia: Moderate Sedation

## 2016-06-12 MED ORDER — MIDAZOLAM HCL 5 MG/5ML IJ SOLN
INTRAMUSCULAR | Status: DC | PRN
  Administered 2016-06-12 (×2): 2 mg via INTRAVENOUS
  Administered 2016-06-12: 1 mg via INTRAVENOUS

## 2016-06-12 MED ORDER — MEPERIDINE HCL 100 MG/ML IJ SOLN
INTRAMUSCULAR | Status: DC
Start: 2016-06-12 — End: 2016-06-12
  Filled 2016-06-12: qty 2

## 2016-06-12 MED ORDER — MIDAZOLAM HCL 5 MG/5ML IJ SOLN
INTRAMUSCULAR | Status: DC
Start: 2016-06-12 — End: 2016-06-12
  Filled 2016-06-12: qty 10

## 2016-06-12 MED ORDER — ONDANSETRON HCL 4 MG/2ML IJ SOLN
INTRAMUSCULAR | Status: AC
  Filled 2016-06-12: qty 2

## 2016-06-12 MED ORDER — STERILE WATER FOR IRRIGATION IR SOLN
Status: DC | PRN
  Administered 2016-06-12: 2.5 mL

## 2016-06-12 MED ORDER — ONDANSETRON HCL 4 MG/2ML IJ SOLN
INTRAMUSCULAR | Status: DC | PRN
  Administered 2016-06-12: 4 mg via INTRAVENOUS

## 2016-06-12 MED ORDER — SODIUM CHLORIDE 0.9 % IV SOLN
INTRAVENOUS | Status: DC
  Administered 2016-06-12: 12:00:00 via INTRAVENOUS

## 2016-06-12 MED ORDER — MEPERIDINE HCL 100 MG/ML IJ SOLN
INTRAMUSCULAR | Status: DC | PRN
  Administered 2016-06-12 (×2): 50 mg via INTRAVENOUS
  Administered 2016-06-12: 25 mg via INTRAVENOUS

## 2016-06-12 NOTE — H&P (View-Only) (Signed)
Referring Provider: [name], MD Primary Care Physician:  Odette Fraction, MD  Primary GI: Dr. [name]   Chief Complaint  Patient presents with  . Hemorrhoids    started 10/23, bleeding  . Constipation    HPI:   Megan [name] is a 55 y.o. female presenting today with rectal bleeding, last seen in 2015. Last colonoscopy in 2011 by Dr. Benson Norway with large hemorrhoids and sessile polyp (path stating benign lymphoid).  Had episode of constipation then had a BM and had rectal bleeding. Has been constipated for a few weeks, after eating more healthy. Hasn't had a BM since Monday, only drips of blood. Some mild lower abdominal discomfort. Notes rectal discomfort. No creams. No N/V. No fever/chills.   Past Medical History:  Diagnosis Date  . CKD (chronic kidney disease) stage 3, GFR 30-59 ml/min   . Essential hypertension, benign   . GERD (gastroesophageal reflux disease)   . Hemorrhoids   . Mixed hyperlipidemia   . S/P colonoscopy Jan 2011   Dr. Benson Norway: sessile polyp (benign lymphoid), large hemorrhoids, repeat 5-10 years  . Type 2 diabetes mellitus ([place])     Past Surgical History:  Procedure Laterality Date  . ABDOMINAL HYSTERECTOMY    . CHOLECYSTECTOMY  09/29/2011   Procedure: LAPAROSCOPIC CHOLECYSTECTOMY;  Surgeon: Jamesetta So, MD;  Location: [facility];  Service: General;  Laterality: N/A;  . COLONOSCOPY  Jan 2011   Dr. Benson Norway: sessile polyp (benign lymphoid), large hemorrhoids, repeat 5-10 years  . ESOPHAGOGASTRODUODENOSCOPY  09/05/2011   BOF:[id number] hiatal hernia; remainder of exam normal. No explanation for patient's abdominal pain with today's examination  . ESOPHAGOGASTRODUODENOSCOPY N/A 12/17/2013   Dr. [name]: gastric erythema, erosion, mild chronic inflammation on path   . LAPAROSCOPIC APPENDECTOMY  09/29/2011   Procedure: APPENDECTOMY LAPAROSCOPIC;  Surgeon: Jamesetta So, MD;  Location: [facility];  Service: General;;  incidental appendectomy    Current Outpatient  Prescriptions  Medication Sig Dispense Refill  . amLODipine (NORVASC) 5 MG tablet Take 1 tablet (5 mg total) by mouth daily. 90 tablet 1  . aspirin EC 81 MG tablet Take 81 mg by mouth daily.      Marland Kitchen atorvastatin (LIPITOR) 20 MG tablet TAKE 1 TABLET DAILY 90 tablet 1  . cloNIDine (CATAPRES) 0.2 MG tablet Take 1 tablet (0.2 mg total) by mouth 2 (two) times daily. (Patient taking differently: Take 0.2 mg by mouth 3 (three) times daily. ) 180 tablet 3  . gabapentin (NEURONTIN) 100 MG capsule Take 1 capsule (100 mg total) by mouth 3 (three) times daily. 90 capsule 3  . glipiZIDE (GLUCOTROL XL) 10 MG 24 hr tablet Take 1 tablet (10 mg total) by mouth daily with breakfast. 90 tablet 1  . hydrALAZINE (APRESOLINE) 25 MG tablet TAKE 1 TABLET (25 MG TOTAL) BY MOUTH 3 (THREE) TIMES DAILY. 270 tablet 3  . HYDROcodone-acetaminophen (NORCO) 10-325 MG tablet Take 1 tablet by mouth every 8 (eight) hours as needed. 30 tablet 0  . loratadine (CLARITIN) 10 MG tablet Take 10 mg by mouth daily.    Marland Kitchen losartan (COZAAR) 100 MG tablet TAKE 1 TABLET DAILY 90 tablet 3  . nebivolol (BYSTOLIC) 10 MG tablet 1 tab po bid (Patient taking differently: Take 10 mg by mouth 2 (two) times daily. 1 tab po bid) 180 tablet 3  . cephALEXin (KEFLEX) 500 MG capsule Take 1 capsule (500 mg total) by mouth 4 (four) times daily. (Patient not taking: Reported on 06/08/2016) 28 capsule 0  .  metroNIDAZOLE (METROGEL) 0.75 % vaginal gel Place 1 Applicatorful vaginally at bedtime. Apply one applicatorful to vagina at bedtime for 5 days (Patient not taking: Reported on 06/08/2016) 70 g 1   No current facility-administered medications for this visit.     Allergies as of 06/08/2016  . (No Known Allergies)    Family History  Problem Relation Age of Onset  . Hypertension Mother   . Coronary artery disease Mother   . Diabetes Mother   . Hypertension Sister   . Coronary artery disease Sister   . Hypertension Brother   . Colon cancer Neg Hx      Social History   Social History  . Marital status: Married    Spouse name: N/A  . Number of children: N/A  . Years of education: N/A   Occupational History  . [employer] # [phone]   Social History Main Topics  . Smoking status: Never Smoker  . Smokeless tobacco: Never Used  . Alcohol use No  . Drug use: No  . Sexual activity: Yes    Birth control/ protection: Surgical   Other Topics Concern  . None   Social History Narrative   Works at [employer] in [place].    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   Also has to get items from high shelves-causes achy pain in shoulder area      Married.   Children are grown, out of house.    Review of Systems: Gen: Denies fever, chills, anorexia. Denies fatigue, weakness, weight loss.  CV: Denies chest pain, palpitations, syncope, peripheral edema, and claudication. Resp: Denies dyspnea at rest, cough, wheezing, coughing up blood, and pleurisy. GI: see HPI  Derm: Denies rash, itching, dry skin Psych: Denies depression, anxiety, memory loss, confusion. No homicidal or suicidal ideation.  Heme: see HPI   Physical Exam: BP (!) 145/87   Pulse 76   Temp 97.5 F (36.4 C) (Oral)   Ht 5\' 2"  (1.575 m)   Wt 176 lb (79.8 kg)   BMI 32.19 kg/m  General:   Alert and oriented. No distress noted. Pleasant and cooperative.  Head:  Normocephalic and atraumatic. Eyes:  Conjuctiva clear without scleral icterus. Mouth:  Oral mucosa pink and moist. Good dentition. No lesions. Heart:  S1, S2 present without murmurs, rubs, or gallops. Regular rate and rhythm. Abdomen:  +BS, soft, non-tender and non-distended. No rebound or guarding. No HSM or masses noted. Rectal: small prolapsed hemorrhoids non-thrombosed, friable, scant blood noted, no internal exam, no obvious anal fissure  Msk:  Symmetrical without gross deformities. Normal posture. Extremities:  Without edema. Neurologic:  Alert and  oriented x4;  grossly normal  neurologically. Psych:  Alert and cooperative. Normal mood and affect.

## 2016-06-12 NOTE — Interval H&P Note (Signed)
History and Physical Interval Note:  06/12/2016 12:36 PM  Megan [name]  has presented today for surgery, with the diagnosis of RECTAL BLEEDING  The various methods of treatment have been discussed with the patient and family. After consideration of risks, benefits and other options for treatment, the patient has consented to  Procedure(s) with comments: COLONOSCOPY (N/A) - 1230  as a surgical intervention .  The patient's history has been reviewed, patient examined, no change in status, stable for surgery.  I have reviewed the patient's chart and labs.  Questions were answered to the patient's satisfaction.     Robert Rourk  Rectal bleeding has tapered off. Otherwise, no change. Diagnostic colonoscopy today per plan.  The risks, benefits, limitations, alternatives and imponderables have been reviewed with the patient. Questions have been answered. All parties are agreeable.

## 2016-06-12 NOTE — Op Note (Signed)
NAME:  Megan [name], Megan [name]           ACCOUNT NO.:  [id number]  MEDICAL RECORD NO.:  [id number]  LOCATION:  [facility]                          FACILITY:  [facility]  PHYSICIAN:  R. Garfield Cornea, MD FACP FACGDATE OF BIRTH:  1960-11-10  DATE OF PROCEDURE: DATE OF DISCHARGE:                              OPERATIVE REPORT   PROCEDURE:  Diagnostic colonoscopy.  INDICATIONS FOR PROCEDURE:  A 55 year old lady with intermittent rectal bleeding in the setting of constipation.  Recently started on Linzess. Taking Linzess 72 daily produced diarrhea.  Colonoscopy is now being done to further evaluate hematochezia and change in bowel habits. Risks, benefits, limitations, alternatives, imponderables have been discussed.  Questions were answered.  All parties agreeable.  Please see the documentation medical record.  PROCEDURE NOTE:  O2 saturation, blood pressure, pulse, respirations monitored throughout the entire procedure.  Conscious sedation Versed 5 mg IV, Demerol 125 mg IV, Zofran 4 mg IV.  TOTAL SERVICE TIME:  19 minutes.  FINDINGS:  Digital rectal exam revealed no prominent grade 3/4 hemorrhoids.  Otherwise, negative.  Endoscopic findings, prep was adequate.  Examination of rectal mucosa including retroflexed view of the anal verge demonstrated no abnormalities.  Colon:  Colonic mucosa was surveyed from the rectosigmoid junction through the left transverse right colon to the appendiceal orifice, ileocecal valve, and cecum.  These structures well seen photographed the record.  From this level, scope was slowly and cautiously withdrawn. Previously, mentioned mucosal surfaces were again seen.  No other abnormalities were observed.  The patient tolerated the procedure well.  IMPRESSION: 1. Prominent grade 3/4 hemorrhoids likely source of hematochezia;     otherwise, normal colonoscopy. 2. But since the patient's hemorrhoid symptoms have improved     dramatically with Anusol cream and successful  management of     constipation.  She may not need further therapy i.e. banding, etc.  RECOMMENDATIONS: 1. Continue Anusol cream as needed. 2. Linzess 72 one capsule every other day or 3 times a week on a     p.r.n. basis. 3. Office visit with [place] in 3 months. 4. Recommend repeat colonoscopy in 10 years for screening purposes.     Bridgette Habermann, MD FACP [facility]     RMR/MEDQ  D:  06/12/2016  T:  06/12/2016  Job:  [id number]  cc:   Marrianne Mood, MD Fax: [phone]

## 2016-06-12 NOTE — Progress Notes (Signed)
CC'D TO PCP °

## 2016-06-12 NOTE — Discharge Instructions (Addendum)
Colonoscopy Discharge Instructions  Read the instructions outlined below and refer to this sheet in the next few weeks. These discharge instructions provide you with general information on caring for yourself after you leave the hospital. Your doctor may also give you specific instructions. While your treatment has been planned according to the most current medical practices available, unavoidable complications occasionally occur. If you have any problems or questions after discharge, call Dr. [name] at [phone]. ACTIVITY  You may resume your regular activity, but move at a slower pace for the next 24 hours.   Take frequent rest periods for the next 24 hours.   Walking will help get rid of the air and reduce the bloated feeling in your belly (abdomen).   No driving for 24 hours (because of the medicine (anesthesia) used during the test).    Do not sign any important legal documents or operate any machinery for 24 hours (because of the anesthesia used during the test).  NUTRITION  Drink plenty of fluids.   You may resume your normal diet as instructed by your doctor.   Begin with a light meal and progress to your normal diet. Heavy or fried foods are harder to digest and may make you feel sick to your stomach (nauseated).   Avoid alcoholic beverages for 24 hours or as instructed.  MEDICATIONS  You may resume your normal medications unless your doctor tells you otherwise.  WHAT YOU CAN EXPECT TODAY  Some feelings of bloating in the abdomen.   Passage of more gas than usual.   Spotting of blood in your stool or on the toilet paper.  IF YOU HAD POLYPS REMOVED DURING THE COLONOSCOPY:  No aspirin products for 7 days or as instructed.   No alcohol for 7 days or as instructed.   Eat a soft diet for the next 24 hours.  FINDING OUT THE RESULTS OF YOUR TEST Not all test results are available during your visit. If your test results are not back during the visit, make an appointment  with your caregiver to find out the results. Do not assume everything is normal if you have not heard from your caregiver or the medical facility. It is important for you to follow up on all of your test results.  SEEK IMMEDIATE MEDICAL ATTENTION IF:  You have more than a spotting of blood in your stool.   Your belly is swollen (abdominal distention).   You are nauseated or vomiting.   You have a temperature over 101.   You have abdominal pain or discomfort that is severe or gets worse throughout the day.    Constipation and hemorrhoid information provided  Pamphlet on hemorrhoid banding provided.  May use Linzess every other day or 3 times a week as needed for constipation  Continue Anusol cream as needed  Office visit with [place] in 3 months.  Repeat colonoscopy in 10 years for screening purposes.  Hemorrhoids Hemorrhoids are swollen veins around the rectum or anus. There are two types of hemorrhoids:   Internal hemorrhoids. These occur in the veins just inside the rectum. They may poke through to the outside and become irritated and painful.  External hemorrhoids. These occur in the veins outside the anus and can be felt as a painful swelling or hard lump near the anus. CAUSES  Pregnancy.   Obesity.   Constipation or diarrhea.   Straining to have a bowel movement.   Sitting for long periods on the toilet.  Heavy lifting or other  activity that caused you to strain.  Anal intercourse. SYMPTOMS   Pain.   Anal itching or irritation.   Rectal bleeding.   Fecal leakage.   Anal swelling.   One or more lumps around the anus.  DIAGNOSIS  Your caregiver may be able to diagnose hemorrhoids by visual examination. Other examinations or tests that may be performed include:   Examination of the rectal area with a gloved hand (digital rectal exam).   Examination of anal canal using a small tube (scope).   A blood test if you have lost a significant amount  of blood.  A test to look inside the colon (sigmoidoscopy or colonoscopy). TREATMENT Most hemorrhoids can be treated at home. However, if symptoms do not seem to be getting better or if you have a lot of rectal bleeding, your caregiver may perform a procedure to help make the hemorrhoids get smaller or remove them completely. Possible treatments include:   Placing a rubber band at the base of the hemorrhoid to cut off the circulation (rubber band ligation).   Injecting a chemical to shrink the hemorrhoid (sclerotherapy).   Using a tool to burn the hemorrhoid (infrared light therapy).   Surgically removing the hemorrhoid (hemorrhoidectomy).   Stapling the hemorrhoid to block blood flow to the tissue (hemorrhoid stapling).  HOME CARE INSTRUCTIONS   Eat foods with fiber, such as whole grains, beans, nuts, fruits, and vegetables. Ask your doctor about taking products with added fiber in them (fibersupplements).  Increase fluid intake. Drink enough water and fluids to keep your urine clear or pale yellow.   Exercise regularly.   Go to the bathroom when you have the urge to have a bowel movement. Do not wait.   Avoid straining to have bowel movements.   Keep the anal area dry and clean. Use wet toilet paper or moist towelettes after a bowel movement.   Medicated creams and suppositories may be used or applied as directed.   Only take over-the-counter or prescription medicines as directed by your caregiver.   Take warm sitz baths for 15-20 minutes, 3-4 times a day to ease pain and discomfort.   Place ice packs on the hemorrhoids if they are tender and swollen. Using ice packs between sitz baths may be helpful.   Put ice in a plastic bag.   Place a towel between your skin and the bag.   Leave the ice on for 15-20 minutes, 3-4 times a day.   Do not use a donut-shaped pillow or sit on the toilet for long periods. This increases blood pooling and pain.  SEEK  MEDICAL CARE IF:  You have increasing pain and swelling that is not controlled by treatment or medicine.  You have uncontrolled bleeding.  You have difficulty or you are unable to have a bowel movement.  You have pain or inflammation outside the area of the hemorrhoids. MAKE SURE YOU:  Understand these instructions.  Will watch your condition.  Will get help right away if you are not doing well or get worse.   This information is not intended to replace advice given to you by your health care provider. Make sure you discuss any questions you have with your health care provider.   Document Released: 07/28/2000 Document Revised: 07/17/2012 Document Reviewed: 06/04/2012 Elsevier Interactive Patient Education 2016 [employer].   Constipation, Adult Constipation is when a person has fewer than three bowel movements a week, has difficulty having a bowel movement, or has stools that are dry,  hard, or larger than normal. As people grow older, constipation is more common. A low-fiber diet, not taking in enough fluids, and taking certain medicines may make constipation worse.  CAUSES   Certain medicines, such as antidepressants, pain medicine, iron supplements, antacids, and water pills.   Certain diseases, such as diabetes, irritable bowel syndrome (IBS), thyroid disease, or depression.   Not drinking enough water.   Not eating enough fiber-rich foods.   Stress or travel.   Lack of physical activity or exercise.   Ignoring the urge to have a bowel movement.   Using laxatives too much.  SIGNS AND SYMPTOMS   Having fewer than three bowel movements a week.   Straining to have a bowel movement.   Having stools that are hard, dry, or larger than normal.   Feeling full or bloated.   Pain in the lower abdomen.   Not feeling relief after having a bowel movement.  DIAGNOSIS  Your health care provider will take a medical history and perform a physical exam.  Further testing may be done for severe constipation. Some tests may include:  A barium enema X-ray to examine your rectum, colon, and, sometimes, your small intestine.   A sigmoidoscopy to examine your lower colon.   A colonoscopy to examine your entire colon. TREATMENT  Treatment will depend on the severity of your constipation and what is causing it. Some dietary treatments include drinking more fluids and eating more fiber-rich foods. Lifestyle treatments may include regular exercise. If these diet and lifestyle recommendations do not help, your health care provider may recommend taking over-the-counter laxative medicines to help you have bowel movements. Prescription medicines may be prescribed if over-the-counter medicines do not work.  HOME CARE INSTRUCTIONS   Eat foods that have a lot of fiber, such as fruits, vegetables, whole grains, and beans.  Limit foods high in fat and processed sugars, such as french fries, hamburgers, cookies, candies, and soda.   A fiber supplement may be added to your diet if you cannot get enough fiber from foods.   Drink enough fluids to keep your urine clear or pale yellow.   Exercise regularly or as directed by your health care provider.   Go to the restroom when you have the urge to go. Do not hold it.   Only take over-the-counter or prescription medicines as directed by your health care provider. Do not take other medicines for constipation without talking to your health care provider first.  [place] IF:   You have bright red blood in your stool.   Your constipation lasts for more than 4 days or gets worse.   You have abdominal or rectal pain.   You have thin, pencil-like stools.   You have unexplained weight loss. MAKE SURE YOU:   Understand these instructions.  Will watch your condition.  Will get help right away if you are not doing well or get worse.   This information is not intended to replace  advice given to you by your health care provider. Make sure you discuss any questions you have with your health care provider.   Document Released: 04/28/2004 Document Revised: 08/21/2014 Document Reviewed: 05/12/2013 Elsevier Interactive Patient Education [employer].

## 2016-06-14 ENCOUNTER — Encounter (HOSPITAL_COMMUNITY): Payer: Self-pay | Admitting: Internal Medicine

## 2016-06-22 ENCOUNTER — Other Ambulatory Visit: Payer: Self-pay | Admitting: Family Medicine

## 2016-06-25 ENCOUNTER — Other Ambulatory Visit: Payer: Self-pay | Admitting: Family Medicine

## 2016-08-08 ENCOUNTER — Other Ambulatory Visit: Payer: Self-pay | Admitting: Family Medicine

## 2016-08-08 DIAGNOSIS — Z1231 Encounter for screening mammogram for malignant neoplasm of breast: Secondary | ICD-10-CM

## 2016-09-04 ENCOUNTER — Ambulatory Visit (HOSPITAL_COMMUNITY)

## 2016-09-06 ENCOUNTER — Other Ambulatory Visit: Payer: Self-pay | Admitting: Physician Assistant

## 2016-09-06 NOTE — Telephone Encounter (Signed)
Rx filled per protocol  

## 2016-09-12 ENCOUNTER — Ambulatory Visit: Admitting: Gastroenterology

## 2016-10-03 ENCOUNTER — Encounter (HOSPITAL_COMMUNITY): Payer: Self-pay | Admitting: Emergency Medicine

## 2016-10-03 ENCOUNTER — Emergency Department (HOSPITAL_COMMUNITY)
Admission: EM | Admit: 2016-10-03 | Discharge: 2016-10-03 | Disposition: A | Discharge status: Home / Self Care | Attending: Emergency Medicine | Admitting: Emergency Medicine

## 2016-10-03 ENCOUNTER — Emergency Department (HOSPITAL_COMMUNITY)

## 2016-10-03 DIAGNOSIS — Z7982 Long term (current) use of aspirin: Secondary | ICD-10-CM | POA: Insufficient documentation

## 2016-10-03 DIAGNOSIS — Z79899 Other long term (current) drug therapy: Secondary | ICD-10-CM | POA: Insufficient documentation

## 2016-10-03 DIAGNOSIS — N183 Chronic kidney disease, stage 3 (moderate): Secondary | ICD-10-CM | POA: Diagnosis not present

## 2016-10-03 DIAGNOSIS — R109 Unspecified abdominal pain: Secondary | ICD-10-CM | POA: Diagnosis not present

## 2016-10-03 DIAGNOSIS — Z7984 Long term (current) use of oral hypoglycemic drugs: Secondary | ICD-10-CM | POA: Diagnosis not present

## 2016-10-03 DIAGNOSIS — I129 Hypertensive chronic kidney disease with stage 1 through stage 4 chronic kidney disease, or unspecified chronic kidney disease: Secondary | ICD-10-CM | POA: Diagnosis not present

## 2016-10-03 DIAGNOSIS — E1122 Type 2 diabetes mellitus with diabetic chronic kidney disease: Secondary | ICD-10-CM | POA: Diagnosis not present

## 2016-10-03 LAB — URINALYSIS, ROUTINE W REFLEX MICROSCOPIC
Bilirubin Urine: NEGATIVE
Glucose, UA: 50 mg/dL — AB
Hgb urine dipstick: NEGATIVE
Ketones, ur: NEGATIVE mg/dL
Leukocytes, UA: NEGATIVE
Nitrite: NEGATIVE
Protein, ur: 100 mg/dL — AB
Specific Gravity, Urine: 1.01 (ref 1.005–1.030)
pH: 6 (ref 5.0–8.0)

## 2016-10-03 LAB — COMPREHENSIVE METABOLIC PANEL
ALT: 17 U/L (ref 14–54)
AST: 16 U/L (ref 15–41)
Albumin: 3.8 g/dL (ref 3.5–5.0)
Alkaline Phosphatase: 86 U/L (ref 38–126)
Anion gap: 9 (ref 5–15)
BUN: 57 mg/dL — ABNORMAL HIGH (ref 6–20)
CO2: 23 mmol/L (ref 22–32)
Calcium: 9 mg/dL (ref 8.9–10.3)
Chloride: 107 mmol/L (ref 101–111)
Creatinine, Ser: 4.89 mg/dL — ABNORMAL HIGH (ref 0.44–1.00)
GFR calc Af Amer: 11 mL/min — ABNORMAL LOW (ref >60)
GFR calc non Af Amer: 9 mL/min — ABNORMAL LOW (ref >60)
Glucose, Bld: 181 mg/dL — ABNORMAL HIGH (ref 65–99)
Potassium: 4.2 mmol/L (ref 3.5–5.1)
Sodium: 139 mmol/L (ref 135–145)
Total Bilirubin: 0.4 mg/dL (ref 0.3–1.2)
Total Protein: 7.6 g/dL (ref 6.5–8.1)

## 2016-10-03 LAB — CBC
HCT: 32.1 % — ABNORMAL LOW (ref 36.0–46.0)
Hemoglobin: 10.5 g/dL — ABNORMAL LOW (ref 12.0–15.0)
MCH: 27.9 pg (ref 26.0–34.0)
MCHC: 32.7 g/dL (ref 30.0–36.0)
MCV: 85.4 fL (ref 78.0–100.0)
Platelets: 347 10*3/uL (ref 150–400)
RBC: 3.76 MIL/uL — ABNORMAL LOW (ref 3.87–5.11)
RDW: 16.4 % — ABNORMAL HIGH (ref 11.5–15.5)
WBC: 11.8 10*3/uL — ABNORMAL HIGH (ref 4.0–10.5)

## 2016-10-03 MED ORDER — TRAMADOL HCL 50 MG PO TABS: 50.0000 mg | ORAL_TABLET | Freq: Four times a day (QID) | ORAL | 0 refills | Status: DC | PRN

## 2016-10-03 MED ORDER — ONDANSETRON HCL 4 MG/2ML IJ SOLN
4.0000 mg | Freq: Once | INTRAMUSCULAR | Status: AC
  Administered 2016-10-03: 4 mg via INTRAVENOUS

## 2016-10-03 MED ORDER — ONDANSETRON HCL 4 MG/2ML IJ SOLN: 4.0000 mg | Freq: Once | INTRAMUSCULAR | Status: DC

## 2016-10-03 MED ORDER — ONDANSETRON HCL 4 MG/2ML IJ SOLN
INTRAMUSCULAR | Status: AC
  Filled 2016-10-03: qty 2

## 2016-10-03 MED ORDER — MORPHINE SULFATE (PF) 4 MG/ML IV SOLN
4.0000 mg | Freq: Once | INTRAVENOUS | Status: AC
  Administered 2016-10-03: 4 mg via INTRAVENOUS
  Filled 2016-10-03: qty 1

## 2016-10-03 NOTE — Discharge Instructions (Signed)

## 2016-10-03 NOTE — ED Provider Notes (Signed)
[facility] DEPT Provider Note   CSN: [id number] Arrival date & time: 10/03/16  2952     History   Chief Complaint Chief Complaint  Patient presents with  . Abdominal Pain    HPI Megan [name] is a 56 y.o. female.  HPI  The patient is a 56 year old female, history of chronic kidney disease related to hypertension, history of diabetes, presents to the hospital today with a complaint of left-sided flank pain which awoke her from sleep at 3:00 AM. This has been somewhat present for the last 4 hours but fluctuating in intensity. It is associated with episodes of nausea and vomiting. On the way to the hospital today the patient did have the pullover to vomit. At this time she is not nauseated but states that her pain is 8 out of 10. There is no fevers, no changes in urination or bowel habits, denies burning or blood in the urine, she does not have any abdominal pain. She does report a history of cholecystectomy and appendectomy several years ago. She has had no medications prior to arrival. She denies any history of kidney stones.  She also denies any history of diverticulitis Past Medical History:  Diagnosis Date  . CKD (chronic kidney disease) stage 3, GFR 30-59 ml/min   . Essential hypertension, benign   . GERD (gastroesophageal reflux disease)   . Hemorrhoids   . Mixed hyperlipidemia   . S/P colonoscopy Jan 2011   Dr. Benson Norway: sessile polyp (benign lymphoid), large hemorrhoids, repeat 5-10 years  . Type 2 diabetes mellitus [facility])     Patient Active Problem List   Diagnosis Date Noted  . Rectal bleeding 06/08/2016  . Vaginal discharge 12/30/2015  . Abdominal pain, epigastric 11/26/2013  . GERD (gastroesophageal reflux disease)   . Type 2 diabetes mellitus ([place])   . CKD (chronic kidney disease) stage 3, GFR 30-59 ml/min   . External hemorrhoid 08/01/2011  . Hematochezia 07/13/2011  . Atypical chest pain 04/21/2010  . Mixed hyperlipidemia 03/11/2008  . OBESITY  03/11/2008  . Essential hypertension, benign 03/11/2008    Past Surgical History:  Procedure Laterality Date  . ABDOMINAL HYSTERECTOMY    . APPENDECTOMY    . CHOLECYSTECTOMY  09/29/2011   Procedure: LAPAROSCOPIC CHOLECYSTECTOMY;  Surgeon: Jamesetta So, MD;  Location: [facility] ORS;  Service: General;  Laterality: N/A;  . COLONOSCOPY  Jan 2011   Dr. Benson Norway: sessile polyp (benign lymphoid), large hemorrhoids, repeat 5-10 years  . COLONOSCOPY N/A 06/12/2016   Procedure: COLONOSCOPY;  Surgeon: Daneil Dolin, MD;  Location: [facility] ENDO SUITE;  Service: Endoscopy;  Laterality: N/A;  1230   . ESOPHAGOGASTRODUODENOSCOPY  09/05/2011   WUX:LKGMW hiatal hernia; remainder of exam normal. No explanation for patient's abdominal pain with today's examination  . ESOPHAGOGASTRODUODENOSCOPY N/A 12/17/2013   Dr. [name]: gastric erythema, erosion, mild chronic inflammation on path   . LAPAROSCOPIC APPENDECTOMY  09/29/2011   Procedure: APPENDECTOMY LAPAROSCOPIC;  Surgeon: Jamesetta So, MD;  Location: [facility] ORS;  Service: General;;  incidental appendectomy    OB History    No data available       Home Medications    Prior to Admission medications   Medication Sig Start Date End Date Taking? Authorizing Provider  amLODipine (NORVASC) 5 MG tablet TAKE 1 TABLET DAILY 09/06/16  Yes Orlena Sheldon, PA-C  aspirin EC 81 MG tablet Take 81 mg by mouth daily.     Yes Historical Provider, MD  atorvastatin (LIPITOR) 20 MG tablet TAKE 1 TABLET  DAILY 06/26/16  Yes [name], MD  cloNIDine (CATAPRES) 0.2 MG tablet Take 1 tablet (0.2 mg total) by mouth 2 (two) times daily. Patient taking differently: Take 0.2 mg by mouth 3 (three) times daily.  05/12/16  Yes [name], MD  glipiZIDE (GLUCOTROL XL) 10 MG 24 hr tablet TAKE 1 TABLET DAILY WITH BREAKFAST 06/22/16  Yes [name], MD  hydrALAZINE (APRESOLINE) 25 MG tablet TAKE 1 TABLET THREE TIMES A DAY 06/26/16  Yes [name], MD  HYDROcodone-acetaminophen  (NORCO) 10-325 MG tablet Take 1 tablet by mouth every 8 (eight) hours as needed. Patient taking differently: Take 1 tablet by mouth every 8 (eight) hours as needed for severe pain.  03/30/16  Yes [name], MD  loratadine (CLARITIN) 10 MG tablet Take 10 mg by mouth daily as needed for allergies.    Yes Historical Provider, MD  losartan (COZAAR) 100 MG tablet TAKE 1 TABLET DAILY 02/29/16  Yes [name], MD  nebivolol (BYSTOLIC) 10 MG tablet 1 tab po bid Patient taking differently: Take 10 mg by mouth 2 (two) times daily. 1 tab po bid 04/19/16  Yes Mary B Dixon, PA-C  traMADol (ULTRAM) 50 MG tablet Take 1 tablet (50 mg total) by mouth every 6 (six) hours as needed. 10/03/16   Noemi Chapel, MD    Family History Family History  Problem Relation Age of Onset  . Hypertension Mother   . Coronary artery disease Mother   . Diabetes Mother   . Hypertension Sister   . Coronary artery disease Sister   . Hypertension Brother   . Colon cancer Neg Hx     Social History Social History  Substance Use Topics  . Smoking status: Never Smoker  . Smokeless tobacco: Never Used  . Alcohol use No     Allergies   Patient has no known allergies.   Review of Systems Review of Systems  All other systems reviewed and are negative.    Physical Exam Updated Vital Signs BP 169/88   Pulse 72   Temp 98 F (36.7 C) (Oral)   Resp 18   Ht 5\' 2"  (1.575 m)   Wt 173 lb (78.5 kg)   SpO2 100%   BMI 31.64 kg/m   Physical Exam  Constitutional: She appears well-developed and well-nourished. No distress.  HENT:  Head: Normocephalic and atraumatic.  Mouth/Throat: Oropharynx is clear and moist. No oropharyngeal exudate.  Eyes: Conjunctivae and EOM are normal. Pupils are equal, round, and reactive to light. Right eye exhibits no discharge. Left eye exhibits no discharge. No scleral icterus.  Neck: Normal range of motion. Neck supple. No JVD present. No thyromegaly present.  Cardiovascular: Normal  rate, regular rhythm, normal heart sounds and intact distal pulses.  Exam reveals no gallop and no friction rub.   No murmur heard. Pulmonary/Chest: Effort normal and breath sounds normal. No respiratory distress. She has no wheezes. She has no rales.  Abdominal: Soft. Bowel sounds are normal. She exhibits no distension and no mass. There is no tenderness.  Very soft and nontender abdomen especially on the left, there is no rebound, no guarding, no tympanitic sounds to percussion, there is normal bowel sounds, there is no CVA tenderness  Musculoskeletal: Normal range of motion. She exhibits no edema or tenderness.  Lymphadenopathy:    She has no cervical adenopathy.  Neurological: She is alert. Coordination normal.  Skin: Skin is warm and dry. No rash noted. No erythema.  Psychiatric: She  has a normal mood and affect. Her behavior is normal.  Nursing note and vitals reviewed.    ED Treatments / Results  Labs (all labs ordered are listed, but only abnormal results are displayed) Labs Reviewed  COMPREHENSIVE METABOLIC PANEL - Abnormal; Notable for the following:       Result Value   Glucose, Bld 181 (*)    BUN 57 (*)    Creatinine, Ser 4.89 (*)    GFR calc non Af Amer 9 (*)    GFR calc Af Amer 11 (*)    All other components within normal limits  CBC - Abnormal; Notable for the following:    WBC 11.8 (*)    RBC 3.76 (*)    Hemoglobin 10.5 (*)    HCT 32.1 (*)    RDW 16.4 (*)    All other components within normal limits  URINALYSIS, ROUTINE W REFLEX MICROSCOPIC - Abnormal; Notable for the following:    Glucose, UA 50 (*)    Protein, ur 100 (*)    Bacteria, UA RARE (*)    All other components within normal limits     Radiology Ct Renal Stone Study  Result Date: 10/03/2016 CLINICAL DATA:  Standard/full stone protocol used; left flank pain with vomiting since 3am today; hx appendectomy and cholecystectomy; NO prev hx of stones EXAM: CT ABDOMEN AND PELVIS WITHOUT CONTRAST  TECHNIQUE: Multidetector CT imaging of the abdomen and pelvis was performed following the standard protocol without IV contrast. COMPARISON:  Lumbar MR 04/25/2016, CT 11/27/2013 FINDINGS: Lower chest: Small pericardial effusion. Visualized lung bases clear. No pleural effusion. Hepatobiliary: No focal liver abnormality is seen. Status post cholecystectomy. No biliary dilatation. Pancreas: Unremarkable. No pancreatic ductal dilatation or surrounding inflammatory changes. Spleen: Normal in size without focal abnormality. Adrenals/Urinary Tract: Normal adrenal glands. 1.8 cm left upper pole low-attenuation renal lesion, incompletely characterized but stable in size since MR 04/25/2016. No urolithiasis or hydronephrosis. Urinary bladder incompletely distended. Stomach/Bowel: Stomach is within normal limits. Surgical clip at the base of the cecum. No evidence of bowel wall thickening, distention, or inflammatory changes. Vascular/Lymphatic: Scattered coarse aortoiliac atheromatous calcifications. No aneurysm. No adenopathy localized. Bilateral pelvic phleboliths. Reproductive: Status post hysterectomy. No adnexal masses. Other: No ascites.  No free air. Musculoskeletal: No acute or significant osseous findings. IMPRESSION: 1. No  urolithiasis or hydronephrosis. 2. Small pericardial effusion Electronically Signed   By: Lucrezia Europe M.D.   On: 10/03/2016 08:37    Procedures Procedures (including critical care time)  Medications Ordered in ED Medications  ondansetron (ZOFRAN) injection 4 mg (not administered)  ondansetron (ZOFRAN) injection 4 mg (4 mg Intravenous Given 10/03/16 0727)  morphine 4 MG/ML injection 4 mg (4 mg Intravenous Given 10/03/16 0732)     Initial Impression / Assessment and Plan / ED Course  I have reviewed the triage vital signs and the nursing notes.  Pertinent labs & imaging results that were available during my care of the patient were reviewed by me and considered in my medical decision  making (see chart for details).    overall the patient appears well, there does not appear to be any signs of acute abdomen however she does have some flank pain and intermittent vomiting consistent with a colicky type pain. CT renal study has been ordered. Labs ordered. Pain medication has been ordered as well. We'll avoid anti-inflammatories secondary to chronic kidney disease.  CT scan findings reviewed with the patient as was the blood work. She is aware that she has  some anemia, she is aware of her renal function, the urinalysis did not reveal any signs of infection or kidney stone and the CT scan did not reveal any findings of intra-abdominal pathologic process. The patient is stable for discharge, she will be encouraged to use tramadol or Tylenol, alternating every 4 hours as needed. She is aware that she may return at any time should her symptoms worsen.  Final Clinical Impressions(s) / ED Diagnoses   Final diagnoses:  Flank pain   New Prescriptions New Prescriptions   TRAMADOL (ULTRAM) 50 MG TABLET    Take 1 tablet (50 mg total) by mouth every 6 (six) hours as needed.     Noemi Chapel, MD 10/03/16 [phone]

## 2016-10-03 NOTE — ED Triage Notes (Signed)
Pt reports left sided abd pain that radiates to left hip since this am. Pt denies any dysuria, hematuria, fever. nad noted.

## 2016-12-19 ENCOUNTER — Other Ambulatory Visit: Payer: Self-pay | Admitting: Family Medicine

## 2017-01-25 ENCOUNTER — Other Ambulatory Visit: Payer: Self-pay | Admitting: *Deleted

## 2017-01-25 MED ORDER — CLONIDINE HCL 0.2 MG PO TABS: 0.2000 mg | ORAL_TABLET | Freq: Two times a day (BID) | ORAL | 3 refills | Status: DC

## 2017-01-25 NOTE — Telephone Encounter (Signed)
Received call from patient.   Requested refill on Clonidine.   Prescription sent to pharmacy.

## 2017-01-30 NOTE — Patient Instructions (Signed)
Megan [name]  01/30/2017     @PREFPERIOPPHARMACY @   Your procedure is scheduled on 02/09/2017.  Report to [facility] at 9:00 A.M.  Call this number if you have problems the morning of surgery:  [phone]   Remember:  Do not eat food or drink liquids after midnight.  Take these medicines the morning of surgery with A SIP OF WATER Amlodipine, Catapress, Hydrocodone if needed, Cozaar, Ultram   Do not wear jewelry, make-up or nail polish.  Do not wear lotions, powders, or perfumes, or deoderant.  Do not shave 48 hours prior to surgery.  Men may shave face and neck.  Do not bring valuables to the hospital.  [facility] is not responsible for any belongings or valuables.  Contacts, dentures or bridgework may not be worn into surgery.  Leave your suitcase in the car.  After surgery it may be brought to your room.  For patients admitted to the hospital, discharge time will be determined by your treatment team.  Patients discharged the day of surgery will not be allowed to drive home.    Please read over the following fact sheets that you were given. Anesthesia Post-op Instructions     PATIENT INSTRUCTIONS POST-ANESTHESIA  IMMEDIATELY FOLLOWING SURGERY:  Do not drive or operate machinery for the first twenty four hours after surgery.  Do not make any important decisions for twenty four hours after surgery or while taking narcotic pain medications or sedatives.  If you develop intractable nausea and vomiting or a severe headache please notify your doctor immediately.  FOLLOW-UP:  Please make an appointment with your surgeon as instructed. You do not need to follow up with anesthesia unless specifically instructed to do so.  WOUND CARE INSTRUCTIONS (if applicable):  Keep a dry clean dressing on the anesthesia/puncture wound site if there is drainage.  Once the wound has quit draining you may leave it open to air.  Generally you should leave the bandage intact for twenty four  hours unless there is drainage.  If the epidural site drains for more than 36-48 hours please call the anesthesia department.  QUESTIONS?:  Please feel free to call your physician or the hospital operator if you have any questions, and they will be happy to assist you.      Cataract Surgery Cataract surgery is a procedure to remove a cataract from your eye. A cataract is cloudiness on the lens of your eye. The lens focuses light inside the eye. When a lens becomes cloudy, your vision is affected. Cataract surgery is a procedure to remove the cloudy lens. A substitute lens (intraocular lens or IOL) is usually inserted as a replacement for the cloudy lens. Tell a health care provider about:  Any allergies you have.  All medicines you are taking, including vitamins, herbs, eye drops, creams, and over-the-counter medicines.  Any problems you or family members have had with anesthetic medicines.  Any blood disorders you have.  Any surgeries you have had, especially eye surgeries that include refractive surgery, such as PRK and LASIK.  Any medical conditions you have.  Whether you are pregnant or may be pregnant. What are the risks? Generally, this is a safe procedure. However, problems may occur, including:  Infection.  Bleeding.  Glaucoma.  Retinal detachment.  Allergic reactions to medicines.  Damage to other structures or organs.  Inflammation of the eye.  Clouding of the part of your eye that holds an IOL in place (after-cataract), if an IOL was  inserted. This is fairly common.  An IOL moving out of position, if an IOL was inserted. This is very rare.  Loss of vision. This is rare.  What happens before the procedure?  Follow instructions from your health care provider about eating or drinking restrictions.  Ask your health care provider about: ? Changing or stopping your regular medicines, including any eye drops you have been prescribed. This is especially important  if you are taking diabetes medicines or blood thinners. ? Taking medicines such as aspirin and ibuprofen. These medicines can thin your blood. Do not take these medicines before your procedure if your health care provider instructs you not to.  Do not put contact lenses in either eye on the day of your surgery.  Plan for someone to drive you to and from the procedure.  If you will be going home right after the procedure, plan to have someone with you for 24 hours. What happens during the procedure?  An IV tube may be inserted into one of your veins.  You will be given one or more of the following: ? A medicine to help you relax (sedative). ? A medicine to numb the area (local anesthetic). This may be numbing eye drops or an injection that is given behind the eye.  A small cut (incision) will be made to the edge of the clear, dome-shaped surface that covers the front of the eye (cornea).  A small probe will be inserted into the eye. This device gives off ultrasound waves that soften and break up the cloudy center of the lens. This makes it easier for the cloudy lens to be removed by suction.  An IOL may be implanted.  Part of the capsule that surrounds the lens will be left in the eye to support the IOL.  Your surgeon may use stitches (sutures) to close the incision. The procedure may vary among health care providers and hospitals. What happens after the procedure?  Your blood pressure, heart rate, breathing rate, and blood oxygen level will be monitored often until the medicines you were given have worn off.  You may be given a protective shield to wear over your eyes.  Do not drive for 24 hours if you received a sedative. This information is not intended to replace advice given to you by your health care provider. Make sure you discuss any questions you have with your health care provider. Document Released: 07/20/2011 Document Revised: 01/06/2016 Document Reviewed:  06/10/2015 Elsevier Interactive Patient Education  2017 [employer].

## 2017-02-01 ENCOUNTER — Encounter (HOSPITAL_COMMUNITY)
Admission: RE | Admit: 2017-02-01 | Discharge: 2017-02-01 | Disposition: A | Discharge status: Home / Self Care | Source: Ambulatory Visit | Attending: Ophthalmology | Admitting: Ophthalmology

## 2017-02-01 ENCOUNTER — Encounter (HOSPITAL_COMMUNITY): Payer: Self-pay

## 2017-02-01 ENCOUNTER — Other Ambulatory Visit: Payer: Self-pay

## 2017-02-01 DIAGNOSIS — Z01818 Encounter for other preprocedural examination: Secondary | ICD-10-CM | POA: Diagnosis not present

## 2017-02-01 DIAGNOSIS — I10 Essential (primary) hypertension: Secondary | ICD-10-CM | POA: Diagnosis not present

## 2017-02-01 HISTORY — DX: Nausea with vomiting, unspecified: R11.2

## 2017-02-01 HISTORY — DX: Anemia, unspecified: D64.9

## 2017-02-01 HISTORY — DX: Other specified postprocedural states: Z98.890

## 2017-02-01 LAB — BASIC METABOLIC PANEL
Anion gap: 14 (ref 5–15)
BUN: 96 mg/dL — ABNORMAL HIGH (ref 6–20)
CO2: 21 mmol/L — ABNORMAL LOW (ref 22–32)
Calcium: 8.6 mg/dL — ABNORMAL LOW (ref 8.9–10.3)
Chloride: 102 mmol/L (ref 101–111)
Creatinine, Ser: 8.29 mg/dL — ABNORMAL HIGH (ref 0.44–1.00)
GFR calc Af Amer: 6 mL/min — ABNORMAL LOW (ref >60)
GFR calc non Af Amer: 5 mL/min — ABNORMAL LOW (ref >60)
Glucose, Bld: 121 mg/dL — ABNORMAL HIGH (ref 65–99)
Potassium: 4.6 mmol/L (ref 3.5–5.1)
Sodium: 137 mmol/L (ref 135–145)

## 2017-02-01 LAB — CBC
HCT: 27.8 % — ABNORMAL LOW (ref 36.0–46.0)
Hemoglobin: 9 g/dL — ABNORMAL LOW (ref 12.0–15.0)
MCH: 28.4 pg (ref 26.0–34.0)
MCHC: 32.4 g/dL (ref 30.0–36.0)
MCV: 87.7 fL (ref 78.0–100.0)
Platelets: 314 10*3/uL (ref 150–400)
RBC: 3.17 MIL/uL — ABNORMAL LOW (ref 3.87–5.11)
RDW: 15.3 % (ref 11.5–15.5)
WBC: 9.8 10*3/uL (ref 4.0–10.5)

## 2017-02-09 ENCOUNTER — Ambulatory Visit (HOSPITAL_COMMUNITY)
Admission: RE | Admit: 2017-02-09 | Discharge: 2017-02-09 | Disposition: A | Discharge status: Home / Self Care | Source: Ambulatory Visit | Attending: Ophthalmology | Admitting: Ophthalmology

## 2017-02-09 ENCOUNTER — Ambulatory Visit (HOSPITAL_COMMUNITY): Admitting: Anesthesiology

## 2017-02-09 ENCOUNTER — Encounter (HOSPITAL_COMMUNITY): Payer: Self-pay | Admitting: Anesthesiology

## 2017-02-09 ENCOUNTER — Encounter (HOSPITAL_COMMUNITY)
Admission: RE | Disposition: A | Discharge status: Home / Self Care | Payer: Self-pay | Source: Ambulatory Visit | Attending: Ophthalmology

## 2017-02-09 DIAGNOSIS — K219 Gastro-esophageal reflux disease without esophagitis: Secondary | ICD-10-CM | POA: Insufficient documentation

## 2017-02-09 DIAGNOSIS — I10 Essential (primary) hypertension: Secondary | ICD-10-CM | POA: Diagnosis not present

## 2017-02-09 DIAGNOSIS — E119 Type 2 diabetes mellitus without complications: Secondary | ICD-10-CM | POA: Insufficient documentation

## 2017-02-09 DIAGNOSIS — H2512 Age-related nuclear cataract, left eye: Secondary | ICD-10-CM | POA: Insufficient documentation

## 2017-02-09 HISTORY — PX: CATARACT EXTRACTION W/PHACO: SHX586

## 2017-02-09 LAB — GLUCOSE, CAPILLARY: Glucose-Capillary: 117 mg/dL — ABNORMAL HIGH (ref 65–99)

## 2017-02-09 SURGERY — PHACOEMULSIFICATION, CATARACT, WITH IOL INSERTION
Anesthesia: Monitor Anesthesia Care | Site: Eye | Laterality: Left

## 2017-02-09 MED ORDER — PHENYLEPHRINE HCL 2.5 % OP SOLN
1.0000 [drp] | OPHTHALMIC | Status: AC
  Administered 2017-02-09 (×3): 1 [drp] via OPHTHALMIC

## 2017-02-09 MED ORDER — BSS IO SOLN
INTRAOCULAR | Status: DC | PRN
  Administered 2017-02-09: 15 mL via INTRAOCULAR

## 2017-02-09 MED ORDER — ONDANSETRON 4 MG PO TBDP
4.0000 mg | ORAL_TABLET | Freq: Once | ORAL | Status: AC
  Administered 2017-02-09: 4 mg via ORAL
  Filled 2017-02-09: qty 1

## 2017-02-09 MED ORDER — EPINEPHRINE PF 1 MG/ML IJ SOLN
INTRAMUSCULAR | Status: AC
  Filled 2017-02-09: qty 1

## 2017-02-09 MED ORDER — POVIDONE-IODINE 5 % OP SOLN
OPHTHALMIC | Status: DC | PRN
Start: 2017-02-09 — End: 2017-02-09
  Administered 2017-02-09: 1 via OPHTHALMIC

## 2017-02-09 MED ORDER — LIDOCAINE 3.5 % OP GEL OPTIME - NO CHARGE
OPHTHALMIC | Status: DC | PRN
  Administered 2017-02-09: 2 [drp] via OPHTHALMIC

## 2017-02-09 MED ORDER — BSS IO SOLN
INTRAOCULAR | Status: DC | PRN
  Administered 2017-02-09: 500 mL

## 2017-02-09 MED ORDER — LIDOCAINE HCL 3.5 % OP GEL: 1.0000 "application " | Freq: Once | OPHTHALMIC | Status: DC

## 2017-02-09 MED ORDER — TETRACAINE HCL 0.5 % OP SOLN
1.0000 [drp] | OPHTHALMIC | Status: AC
  Administered 2017-02-09 (×3): 1 [drp] via OPHTHALMIC

## 2017-02-09 MED ORDER — LIDOCAINE HCL (PF) 1 % IJ SOLN
INTRAMUSCULAR | Status: DC | PRN
Start: 2017-02-09 — End: 2017-02-09
  Administered 2017-02-09: .6 mL

## 2017-02-09 MED ORDER — NEOMYCIN-POLYMYXIN-DEXAMETH 3.5-10000-0.1 OP SUSP
OPHTHALMIC | Status: DC | PRN
Start: 2017-02-09 — End: 2017-02-09
  Administered 2017-02-09: 2 [drp] via OPHTHALMIC

## 2017-02-09 MED ORDER — SODIUM CHLORIDE 0.9 % IV SOLN
INTRAVENOUS | Status: DC
  Administered 2017-02-09: 10:00:00 via INTRAVENOUS

## 2017-02-09 MED ORDER — MIDAZOLAM HCL 2 MG/2ML IJ SOLN
1.0000 mg | Freq: Once | INTRAMUSCULAR | Status: AC | PRN
  Administered 2017-02-09: 2 mg via INTRAVENOUS
  Filled 2017-02-09: qty 2

## 2017-02-09 MED ORDER — CYCLOPENTOLATE-PHENYLEPHRINE 0.2-1 % OP SOLN
1.0000 [drp] | OPHTHALMIC | Status: AC
  Administered 2017-02-09 (×3): 1 [drp] via OPHTHALMIC

## 2017-02-09 MED ORDER — PROVISC 10 MG/ML IO SOLN
INTRAOCULAR | Status: DC | PRN
  Administered 2017-02-09: 0.85 mL via INTRAOCULAR

## 2017-02-09 MED ORDER — LACTATED RINGERS IV SOLN: INTRAVENOUS | Status: DC

## 2017-02-09 SURGICAL SUPPLY — 12 items
CLOTH BEACON ORANGE TIMEOUT ST (SAFETY) ×1 IMPLANT
EYE SHIELD UNIVERSAL CLEAR (GAUZE/BANDAGES/DRESSINGS) ×1 IMPLANT
GLOVE BIOGEL PI IND STRL 6.5 (GLOVE) IMPLANT
GLOVE BIOGEL PI IND STRL 7.0 (GLOVE) IMPLANT
GLOVE BIOGEL PI INDICATOR 6.5 (GLOVE) ×1
GLOVE BIOGEL PI INDICATOR 7.0 (GLOVE) ×1
PAD ARMBOARD 7.5X6 YLW CONV (MISCELLANEOUS) ×1 IMPLANT
SIGHTPATH CAT PROC W REG LENS (Ophthalmic Related) ×2 IMPLANT
SYRINGE LUER LOK 1CC (MISCELLANEOUS) ×1 IMPLANT
TAPE SURG TRANSPORE 1 IN (GAUZE/BANDAGES/DRESSINGS) IMPLANT
TAPE SURGICAL TRANSPORE 1 IN (GAUZE/BANDAGES/DRESSINGS) ×1
WATER STERILE IRR 250ML POUR (IV SOLUTION) ×1 IMPLANT

## 2017-02-09 NOTE — Transfer of Care (Signed)
Immediate Anesthesia Transfer of Care Note  Patient: Megan [name]  Procedure(s) Performed: Procedure(s) with comments: CATARACT EXTRACTION PHACO AND INTRAOCULAR LENS PLACEMENT LEFT EYE (Left) - CDE: 4.89  Patient Location: PACU  Anesthesia Type:MAC  Level of Consciousness: awake  Airway & Oxygen Therapy: Patient Spontanous Breathing  Post-op Assessment: Report given to RN  Post vital signs: Reviewed  Last Vitals:  Vitals:   02/09/17 0958  Temp: 36.3 C    Last Pain:  Vitals:   02/09/17 0958  TempSrc: Oral      Patients Stated Pain Goal: 6 (08/17/02 5913)  Complications: No apparent anesthesia complications

## 2017-02-09 NOTE — Anesthesia Preprocedure Evaluation (Signed)
Anesthesia Evaluation  Patient identified by MRN, date of birth, ID band Patient awake    History of Anesthesia Complications (+) PONV  Airway Mallampati: I  TM Distance: <3 FB Neck ROM: Full    Dental  (+) Teeth Intact   Pulmonary    Pulmonary exam normal        Cardiovascular hypertension, Pt. on medications Normal cardiovascular exam Rhythm:Regular Rate:Normal     Neuro/Psych    GI/Hepatic GERD  Medicated and Controlled,  Endo/Other  diabetes, Well Controlled, Type 2  Renal/GU CRFRenal disease     Musculoskeletal   Abdominal Normal abdominal exam  (+)   Peds  Hematology   Anesthesia Other Findings   Reproductive/Obstetrics                             Anesthesia Physical Anesthesia Plan  ASA: III  Anesthesia Plan: MAC   Post-op Pain Management:    Induction: Intravenous  PONV Risk Score and Plan: Ondansetron  Airway Management Planned: Nasal Cannula  Additional Equipment:   Intra-op Plan:   Post-operative Plan:   Informed Consent: I have reviewed the patients History and Physical, chart, labs and discussed the procedure including the risks, benefits and alternatives for the proposed anesthesia with the patient or authorized representative who has indicated his/her understanding and acceptance.     Plan Discussed with: CRNA  Anesthesia Plan Comments:         Anesthesia Quick Evaluation

## 2017-02-09 NOTE — Discharge Instructions (Signed)
Moderate Conscious Sedation, Adult, Care After  These instructions provide you with information about caring for yourself after your procedure. Your health care provider may also give you more specific instructions. Your treatment has been planned according to current medical practices, but problems sometimes occur. Call your health care provider if you have any problems or questions after your procedure.  What can I expect after the procedure?  After your procedure, it is common:   To feel sleepy for several hours.   To feel clumsy and have poor balance for several hours.   To have poor judgment for several hours.   To vomit if you eat too soon.    Follow these instructions at home:  For at least 24 hours after the procedure:     Do not:  ? Participate in activities where you could fall or become injured.  ? Drive.  ? Use heavy machinery.  ? Drink alcohol.  ? Take sleeping pills or medicines that cause drowsiness.  ? Make important decisions or sign legal documents.  ? Take care of children on your own.   Rest.  Eating and drinking   Follow the diet recommended by your health care provider.   If you vomit:  ? Drink water, juice, or soup when you can drink without vomiting.  ? Make sure you have little or no nausea before eating solid foods.  General instructions   Have a responsible adult stay with you until you are awake and alert.   Take over-the-counter and prescription medicines only as told by your health care provider.   If you smoke, do not smoke without supervision.   Keep all follow-up visits as told by your health care provider. This is important.  Contact a health care provider if:   You keep feeling nauseous or you keep vomiting.   You feel light-headed.   You develop a rash.   You have a fever.  Get help right away if:   You have trouble breathing.  This information is not intended to replace advice given to you by your health care provider. Make sure you discuss any questions you have  with your health care provider.  Document Released: 05/21/2013 Document Revised: 01/03/2016 Document Reviewed: 11/20/2015  Elsevier Interactive Patient Education  2018 [employer]

## 2017-02-09 NOTE — Anesthesia Postprocedure Evaluation (Signed)
Anesthesia Post Note  Patient: Megan [name]  Procedure(s) Performed: Procedure(s) (LRB): CATARACT EXTRACTION PHACO AND INTRAOCULAR LENS PLACEMENT LEFT EYE (Left)  Patient location during evaluation: Short Stay Anesthesia Type: MAC Level of consciousness: awake and alert Pain management: pain level controlled Vital Signs Assessment: post-procedure vital signs reviewed and stable Respiratory status: spontaneous breathing Cardiovascular status: blood pressure returned to baseline Postop Assessment: no signs of nausea or vomiting Anesthetic complications: no     Last Vitals:  Vitals:   02/09/17 0958  Temp: 36.3 C    Last Pain:  Vitals:   02/09/17 0958  TempSrc: Oral                 Dawnisha Marquina

## 2017-02-09 NOTE — Op Note (Signed)
Date of Admission: 02/09/2017  Date of Surgery: 02/09/2017  Pre-Op Dx: Cataract Left  Eye  Post-Op Dx: Senile Nuclear Cataract  Left  Eye,  Dx Code H25.12  Surgeon: Tonny Branch, M.D.  Assistants: None  Anesthesia: Topical with MAC  Indications: Painless, progressive loss of vision with compromise of daily activities.  Surgery: Cataract Extraction with Intraocular lens Implant Left Eye  Discription: The patient had dilating drops and viscous lidocaine placed into the Left eye in the pre-op holding area. After transfer to the operating room, a time out was performed. The patient was then prepped and draped. Beginning with a 75m blade a paracentesis port was made at the surgeon's 2 o'clock position. The anterior chamber was then filled with 1% non-preserved lidocaine. This was followed by filling the anterior chamber with Provisc.  A 2.456mkeratome blade was used to make a clear corneal incision at the temporal limbus.  A bent cystatome needle was used to create a continuous tear capsulotomy. Hydrodissection was performed with balanced salt solution on a Fine canula. The lens nucleus was then removed using the phacoemulsification handpiece. Residual cortex was removed with the I&A handpiece. The anterior chamber and capsular bag were refilled with Provisc. A posterior chamber intraocular lens was placed into the capsular bag with it's injector. The implant was positioned with the Kuglan hook. The Provisc was then removed from the anterior chamber and capsular bag with the I&A handpiece. Stromal hydration of the main incision and paracentesis port was performed with BSS on a Fine canula. The wounds were tested for leak which was negative. The patient tolerated the procedure well. There were no operative complications. The patient was then transferred to the recovery room in stable condition.  Complications: None  Specimen: None  EBL: None  Prosthetic device: Abbott Technis, PCB00, power 22.0, SN  [id number]

## 2017-02-09 NOTE — H&P (Signed)
I have reviewed the H&P, the patient was re-examined, and I have identified no interval changes in medical condition and plan of care since the history and physical of record  

## 2017-02-12 ENCOUNTER — Encounter (HOSPITAL_COMMUNITY): Payer: Self-pay | Admitting: Ophthalmology

## 2017-02-15 ENCOUNTER — Other Ambulatory Visit: Payer: Self-pay | Admitting: Family Medicine

## 2017-02-15 DIAGNOSIS — Z1231 Encounter for screening mammogram for malignant neoplasm of breast: Secondary | ICD-10-CM

## 2017-02-16 ENCOUNTER — Ambulatory Visit (HOSPITAL_COMMUNITY)
Admission: RE | Admit: 2017-02-16 | Discharge: 2017-02-16 | Disposition: A | Discharge status: Home / Self Care | Source: Ambulatory Visit | Attending: Family Medicine | Admitting: Family Medicine

## 2017-02-16 DIAGNOSIS — Z1231 Encounter for screening mammogram for malignant neoplasm of breast: Secondary | ICD-10-CM | POA: Diagnosis not present

## 2017-02-23 ENCOUNTER — Other Ambulatory Visit: Payer: Self-pay | Admitting: Family Medicine

## 2017-03-05 ENCOUNTER — Other Ambulatory Visit: Payer: Self-pay | Admitting: Physician Assistant

## 2017-03-17 ENCOUNTER — Emergency Department (HOSPITAL_COMMUNITY)
Admission: EM | Admit: 2017-03-17 | Discharge: 2017-03-18 | Disposition: A | Discharge status: Home / Self Care | Attending: Emergency Medicine | Admitting: Emergency Medicine

## 2017-03-17 ENCOUNTER — Encounter (HOSPITAL_COMMUNITY): Payer: Self-pay

## 2017-03-17 ENCOUNTER — Emergency Department (HOSPITAL_COMMUNITY)

## 2017-03-17 DIAGNOSIS — I120 Hypertensive chronic kidney disease with stage 5 chronic kidney disease or end stage renal disease: Secondary | ICD-10-CM | POA: Diagnosis not present

## 2017-03-17 DIAGNOSIS — Z7982 Long term (current) use of aspirin: Secondary | ICD-10-CM | POA: Diagnosis not present

## 2017-03-17 DIAGNOSIS — T85611A Breakdown (mechanical) of intraperitoneal dialysis catheter, initial encounter: Secondary | ICD-10-CM | POA: Diagnosis not present

## 2017-03-17 DIAGNOSIS — Y658 Other specified misadventures during surgical and medical care: Secondary | ICD-10-CM | POA: Insufficient documentation

## 2017-03-17 DIAGNOSIS — R1084 Generalized abdominal pain: Secondary | ICD-10-CM

## 2017-03-17 DIAGNOSIS — Z79899 Other long term (current) drug therapy: Secondary | ICD-10-CM | POA: Diagnosis not present

## 2017-03-17 DIAGNOSIS — N186 End stage renal disease: Secondary | ICD-10-CM | POA: Diagnosis not present

## 2017-03-17 DIAGNOSIS — E1122 Type 2 diabetes mellitus with diabetic chronic kidney disease: Secondary | ICD-10-CM | POA: Diagnosis not present

## 2017-03-17 DIAGNOSIS — Z992 Dependence on renal dialysis: Secondary | ICD-10-CM | POA: Diagnosis not present

## 2017-03-17 DIAGNOSIS — R110 Nausea: Secondary | ICD-10-CM | POA: Insufficient documentation

## 2017-03-17 DIAGNOSIS — R079 Chest pain, unspecified: Secondary | ICD-10-CM | POA: Diagnosis present

## 2017-03-17 DIAGNOSIS — R0600 Dyspnea, unspecified: Secondary | ICD-10-CM | POA: Insufficient documentation

## 2017-03-17 DIAGNOSIS — D631 Anemia in chronic kidney disease: Secondary | ICD-10-CM | POA: Diagnosis not present

## 2017-03-17 LAB — CBC WITH DIFFERENTIAL/PLATELET
Basophils Absolute: 0 10*3/uL (ref 0.0–0.1)
Basophils Relative: 0 %
Eosinophils Absolute: 0.2 10*3/uL (ref 0.0–0.7)
Eosinophils Relative: 2 %
HCT: 29.8 % — ABNORMAL LOW (ref 36.0–46.0)
Hemoglobin: 9.7 g/dL — ABNORMAL LOW (ref 12.0–15.0)
Lymphocytes Relative: 34 %
Lymphs Abs: 4 10*3/uL (ref 0.7–4.0)
MCH: 28.4 pg (ref 26.0–34.0)
MCHC: 32.6 g/dL (ref 30.0–36.0)
MCV: 87.1 fL (ref 78.0–100.0)
Monocytes Absolute: 0.6 10*3/uL (ref 0.1–1.0)
Monocytes Relative: 5 %
Neutro Abs: 7 10*3/uL (ref 1.7–7.7)
Neutrophils Relative %: 59 %
Platelets: 391 10*3/uL (ref 150–400)
RBC: 3.42 MIL/uL — ABNORMAL LOW (ref 3.87–5.11)
RDW: 16.1 % — ABNORMAL HIGH (ref 11.5–15.5)
WBC: 11.9 10*3/uL — ABNORMAL HIGH (ref 4.0–10.5)

## 2017-03-17 LAB — COMPREHENSIVE METABOLIC PANEL
ALT: 27 U/L (ref 14–54)
AST: 19 U/L (ref 15–41)
Albumin: 3.9 g/dL (ref 3.5–5.0)
Alkaline Phosphatase: 91 U/L (ref 38–126)
Anion gap: 13 (ref 5–15)
BUN: 70 mg/dL — ABNORMAL HIGH (ref 6–20)
CO2: 22 mmol/L (ref 22–32)
Calcium: 9.1 mg/dL (ref 8.9–10.3)
Chloride: 106 mmol/L (ref 101–111)
Creatinine, Ser: 7.53 mg/dL — ABNORMAL HIGH (ref 0.44–1.00)
GFR calc Af Amer: 6 mL/min — ABNORMAL LOW (ref >60)
GFR calc non Af Amer: 5 mL/min — ABNORMAL LOW (ref >60)
Glucose, Bld: 84 mg/dL (ref 65–99)
Potassium: 4.3 mmol/L (ref 3.5–5.1)
Sodium: 141 mmol/L (ref 135–145)
Total Bilirubin: 0.6 mg/dL (ref 0.3–1.2)
Total Protein: 8 g/dL (ref 6.5–8.1)

## 2017-03-17 LAB — LIPASE, BLOOD: Lipase: 45 U/L (ref 11–51)

## 2017-03-17 MED ORDER — ONDANSETRON 8 MG PO TBDP
8.0000 mg | ORAL_TABLET | Freq: Once | ORAL | Status: AC
  Administered 2017-03-17: 8 mg via ORAL
  Filled 2017-03-17: qty 1

## 2017-03-17 NOTE — ED Notes (Signed)
EKG given to Dr. Zackowski  

## 2017-03-17 NOTE — ED Triage Notes (Signed)
Reports of starting peritoneal dialysis yesterday, states today she did treatment at 1700 and unable to drain fluid, approx. 2000 ML. States she was sent by nurse in [place] to have KUB xray. Also reports of chest pain that started today while sitting down.

## 2017-03-17 NOTE — ED Provider Notes (Signed)
[facility] DEPT Provider Note   CSN: [id number] Arrival date & time: 03/17/17  2227     History   Chief Complaint Chief Complaint  Patient presents with  . Chest Pain  . Abdominal Pain    HPI Megan [name] is a 56 y.o. female.  The history is provided by the patient.  She has a history of end-stage renal disease and just started on peritoneal dialysis yesterday. Today, she put the dialysis fluid in her abdomen at about noon. At about 4 PM, she said having sharp pains in her abdomen and chest. There is mild associated nausea and mild dyspnea but no diaphoresis. She denies fever or chills. She was scheduled to remove the dialysis fluid at 5 PM, but was unable to do so. She called her dialysis nurse, who also tried various maneuvers, but was unable to get the fluid to drain. She is advised to come to the hospital to have a KUB done. She is advised that it might be a problem with being constipated, but she denies constipation. Pain is rated at 8/10. Nothing makes it better, nothing makes it worse. She does have history of hypertension and diabetes and hyperlipidemia.  Past Medical History:  Diagnosis Date  . Anemia   . CKD (chronic kidney disease) stage 3, GFR 30-59 ml/min   . Essential hypertension, benign   . GERD (gastroesophageal reflux disease)   . Hemorrhoids   . Mixed hyperlipidemia   . PONV (postoperative nausea and vomiting)   . S/P colonoscopy Jan 2011   Dr. Benson Norway: sessile polyp (benign lymphoid), large hemorrhoids, repeat 5-10 years  . Type 2 diabetes mellitus [facility])     Patient Active Problem List   Diagnosis Date Noted  . Rectal bleeding 06/08/2016  . Vaginal discharge 12/30/2015  . Abdominal pain, epigastric 11/26/2013  . GERD (gastroesophageal reflux disease)   . Type 2 diabetes mellitus ([place])   . CKD (chronic kidney disease) stage 3, GFR 30-59 ml/min   . External hemorrhoid 08/01/2011  . Hematochezia 07/13/2011  . Atypical chest pain 04/21/2010  .  Mixed hyperlipidemia 03/11/2008  . OBESITY 03/11/2008  . Essential hypertension, benign 03/11/2008    Past Surgical History:  Procedure Laterality Date  . ABDOMINAL HYSTERECTOMY    . APPENDECTOMY    . CATARACT EXTRACTION W/PHACO Left 02/09/2017   Procedure: CATARACT EXTRACTION PHACO AND INTRAOCULAR LENS PLACEMENT LEFT EYE;  Surgeon: Tonny Branch, MD;  Location: [facility] ORS;  Service: Ophthalmology;  Laterality: Left;  CDE: 4.89  . CHOLECYSTECTOMY  09/29/2011   Procedure: LAPAROSCOPIC CHOLECYSTECTOMY;  Surgeon: Jamesetta So, MD;  Location: [facility] ORS;  Service: General;  Laterality: N/A;  . COLONOSCOPY  Jan 2011   Dr. Benson Norway: sessile polyp (benign lymphoid), large hemorrhoids, repeat 5-10 years  . COLONOSCOPY N/A 06/12/2016   Procedure: COLONOSCOPY;  Surgeon: Daneil Dolin, MD;  Location: [facility] ENDO SUITE;  Service: Endoscopy;  Laterality: N/A;  1230   . ESOPHAGOGASTRODUODENOSCOPY  09/05/2011   BJS:EGBTD hiatal hernia; remainder of exam normal. No explanation for patient's abdominal pain with today's examination  . ESOPHAGOGASTRODUODENOSCOPY N/A 12/17/2013   Dr. [name]: gastric erythema, erosion, mild chronic inflammation on path   . LAPAROSCOPIC APPENDECTOMY  09/29/2011   Procedure: APPENDECTOMY LAPAROSCOPIC;  Surgeon: Jamesetta So, MD;  Location: [facility] ORS;  Service: General;;  incidental appendectomy    OB History    No data available       Home Medications    Prior to Admission medications   Medication Sig  Start Date End Date Taking? Authorizing Provider  acetaminophen (TYLENOL) 650 MG CR tablet Take 1,300 mg by mouth every 8 (eight) hours as needed for pain.    [provider]  amLODipine (NORVASC) 5 MG tablet TAKE 1 TABLET DAILY 03/05/17   Orlena Sheldon, PA-C  aspirin EC 81 MG tablet Take 81 mg by mouth daily.      [provider]  atorvastatin (LIPITOR) 20 MG tablet TAKE 1 TABLET DAILY Patient taking differently: TAKE 1 TABLET DAILY AT BEDTIME 06/26/16   [name],  MD  BYSTOLIC 10 MG tablet TAKE 1 TABLET TWICE A DAY 03/05/17   Dena Billet B, PA-C  cloNIDine (CATAPRES) 0.2 MG tablet Take 1 tablet (0.2 mg total) by mouth 2 (two) times daily. Patient taking differently: Take 0.2 mg by mouth 3 (three) times daily.  01/25/17   [name], MD  ferric citrate (AURYXIA) 1 GM 210 MG(Fe) tablet Take 210 mg by mouth 3 (three) times daily with meals.    [provider]  glipiZIDE (GLUCOTROL XL) 5 MG 24 hr tablet Take 5 mg by mouth daily with breakfast.    [provider]  hydrALAZINE (APRESOLINE) 25 MG tablet TAKE 1 TABLET THREE TIMES A DAY 06/26/16   [name], MD  losartan (COZAAR) 100 MG tablet TAKE 1 TABLET DAILY 02/23/17   [name], MD  Sodium Citrate Dihydrate 2.7 GM/30ML SOLN Take 30 mLs by mouth daily at 12 noon.    [provider]    Family History Family History  Problem Relation Age of Onset  . Hypertension Mother   . Coronary artery disease Mother   . Diabetes Mother   . Hypertension Sister   . Coronary artery disease Sister   . Hypertension Brother   . Colon cancer Neg Hx     Social History Social History  Substance Use Topics  . Smoking status: Never Smoker  . Smokeless tobacco: Never Used  . Alcohol use No     Allergies   Patient has no known allergies.   Review of Systems Review of Systems  All other systems reviewed and are negative.    Physical Exam Updated Vital Signs BP (!) 137/97 (BP Location: Left Arm)   Pulse 86   Temp 98.1 F (36.7 C) (Oral)   Resp 16   Ht 5\' 2"  (1.575 m)   Wt 78 kg (172 lb)   SpO2 97%   BMI 31.46 kg/m   Physical Exam  Nursing note and vitals reviewed.  56 year old female, resting comfortably and in no acute distress. Vital signs are significant for hypertension. Oxygen saturation is 97%, which is normal. Head is normocephalic and atraumatic. PERRLA, EOMI. Oropharynx is clear. Neck is nontender and supple without adenopathy or JVD. Back is  nontender and there is no CVA tenderness. Lungs are clear without rales, wheezes, or rhonchi. Chest is nontender. Heart has regular rate and rhythm without murmur. Abdomen is soft, flat, with mild tenderness diffusely. There is no rebound or guarding. Peritoneal dialysis catheter is present. There are no masses or hepatosplenomegaly and peristalsis is normoactive. Extremities have no cyanosis or edema, full range of motion is present. Skin is warm and dry without rash. Neurologic: Mental status is normal, cranial nerves are intact, there are no motor or sensory deficits.  ED Treatments / Results  Labs (all labs ordered are listed, but only abnormal results are displayed) Labs Reviewed  COMPREHENSIVE METABOLIC PANEL - Abnormal; Notable for  the following:       Result Value   BUN 70 (*)    Creatinine, Ser 7.53 (*)    GFR calc non Af Amer 5 (*)    GFR calc Af Amer 6 (*)    All other components within normal limits  CBC WITH DIFFERENTIAL/PLATELET - Abnormal; Notable for the following:    WBC 11.9 (*)    RBC 3.42 (*)    Hemoglobin 9.7 (*)    HCT 29.8 (*)    RDW 16.1 (*)    All other components within normal limits  LIPASE, BLOOD  I-STAT TROPONIN, ED     Radiology Dg Chest 2 View  Result Date: 03/17/2017 CLINICAL DATA:  Chest pain and shortness of breath, on peritoneal dialysis. EXAM: CHEST  2 VIEW COMPARISON:  Chest radiograph November 30, 2014 FINDINGS: Cardiomediastinal silhouette is normal. No pleural effusions or focal consolidations. Bibasilar strandy densities. Trachea projects midline and there is no pneumothorax. Colonic interpositioning. Soft tissue planes and included osseous structures are non-suspicious. IMPRESSION: Stable examination:  Bibasilar atelectasis. Electronically Signed   By: Elon Alas M.D.   On: 03/17/2017 23:50   Dg Abdomen 1 View  Result Date: 03/17/2017 CLINICAL DATA:  Generalized abdominal pain. Started peritoneal dialysis yesterday. EXAM: ABDOMEN - 1  VIEW COMPARISON:  CT abdomen and pelvis October 11, 2016 FINDINGS: Dialysis catheter loops in RIGHT abdomen. Bowel gas pattern is nondilated and nonobstructive. No intra-abdominal mass effect or pathologic calcifications. Phleboliths in the pelvis. Surgical clips in the included right abdomen compatible with cholecystectomy. Soft tissue planes included osseous structures are nonsuspicious. IMPRESSION: Peritoneal dialysis catheter projects in RIGHT abdomen. Normal bowel gas pattern. Electronically Signed   By: Elon Alas M.D.   On: 03/17/2017 23:49    Procedures Procedures (including critical care time)  Medications Ordered in ED Medications  oxyCODONE-acetaminophen (PERCOCET/ROXICET) 5-325 MG per tablet 1 tablet (not administered)  ondansetron (ZOFRAN-ODT) disintegrating tablet 8 mg (8 mg Oral Given 03/17/17 2353)     Initial Impression / Assessment and Plan / ED Course  I have reviewed the triage vital signs and the nursing notes.  Pertinent labs & imaging results that were available during my care of the patient were reviewed by me and considered in my medical decision making (see chart for details).  Abdominal pain and chest pain of uncertain cause. Clocked peritoneal dialysis catheter. Very low suspicion for peritonitis. Screening labs are obtained and will check x-rays. Old records are reviewed, and she has prior ED visits for atypical chest pain. Current pain does not seem cardiac in any way, and is likely related to her peritoneal dialysis.  X-rays are unremarkable. IVC is normal. I was unable to get any fluid out from her catheter. Attempts to discuss with nephrology and general surgery here have been unsuccessful. Her nephrologist and surgeon are on staff in [place]. Patient is advised that they are the ones who should be managing her peritoneal dialysis catheter problems. She stated that she did when his stay in the ED any longer and was discharged. She was given a single dose of  oxycodone have acetaminophen for pain. Strict return precautions were given with the proviso that she should go to the ED in [place] where her nephrologist and surgeon are on staff. Patient expresses understanding.  Final Clinical Impressions(s) / ED Diagnoses   Final diagnoses:  Generalized abdominal pain  End-stage renal disease on peritoneal dialysis [facility])  Peritoneal dialysis catheter dysfunction, initial encounter ([place])  Anemia in chronic kidney  disease, on chronic dialysis [facility])    New Prescriptions New Prescriptions   No medications on file     Delora Fuel, MD 67/34/19 [phone]

## 2017-03-18 LAB — I-STAT TROPONIN, ED: Troponin i, poc: 0 ng/mL (ref 0.00–0.08)

## 2017-03-18 MED ORDER — OXYCODONE-ACETAMINOPHEN 5-325 MG PO TABS
1.0000 | ORAL_TABLET | Freq: Once | ORAL | Status: AC
  Administered 2017-03-18: 1 via ORAL
  Filled 2017-03-18: qty 1

## 2017-03-18 NOTE — ED Notes (Signed)
I-stat Troponin Result: 0.00

## 2017-03-18 NOTE — Discharge Instructions (Signed)
You need to talk with the surgeon who put your catheter in - it may need to be repositioned, or a new one may need to be placed. Call him/her today - let him/her know that it will not drain, and that you are having some pain. If pain gets worse, or if you start running a fever, go to the ED in [place], where your surgeon and nephrologist are on staff.

## 2017-03-19 ENCOUNTER — Ambulatory Visit

## 2017-03-20 ENCOUNTER — Other Ambulatory Visit: Admitting: Family Medicine

## 2017-03-20 ENCOUNTER — Ambulatory Visit

## 2017-04-03 ENCOUNTER — Encounter: Payer: Self-pay | Admitting: Family Medicine

## 2017-04-03 ENCOUNTER — Other Ambulatory Visit: Payer: Self-pay | Admitting: Family Medicine

## 2017-04-03 ENCOUNTER — Ambulatory Visit (INDEPENDENT_AMBULATORY_CARE_PROVIDER_SITE_OTHER): Admitting: Family Medicine

## 2017-04-03 VITALS — BP 154/90 | HR 80 | Temp 98.3°F | Resp 14 | Ht 62.0 in | Wt 172.0 lb

## 2017-04-03 DIAGNOSIS — Z124 Encounter for screening for malignant neoplasm of cervix: Secondary | ICD-10-CM | POA: Diagnosis not present

## 2017-04-03 DIAGNOSIS — E1122 Type 2 diabetes mellitus with diabetic chronic kidney disease: Secondary | ICD-10-CM

## 2017-04-03 DIAGNOSIS — I120 Hypertensive chronic kidney disease with stage 5 chronic kidney disease or end stage renal disease: Secondary | ICD-10-CM | POA: Diagnosis not present

## 2017-04-03 DIAGNOSIS — N185 Chronic kidney disease, stage 5: Secondary | ICD-10-CM | POA: Diagnosis not present

## 2017-04-03 DIAGNOSIS — I10 Essential (primary) hypertension: Secondary | ICD-10-CM

## 2017-04-03 LAB — CBC WITH DIFFERENTIAL/PLATELET
Basophils Absolute: 0 cells/uL (ref 0–200)
Basophils Relative: 0 %
Eosinophils Absolute: 126 cells/uL (ref 15–500)
Eosinophils Relative: 1 %
HCT: 27.4 % — ABNORMAL LOW (ref 35.0–45.0)
Hemoglobin: 8.8 g/dL — CL (ref 12.0–15.0)
Lymphocytes Relative: 22 %
Lymphs Abs: 2772 cells/uL (ref 850–3900)
MCH: 28.3 pg (ref 27.0–33.0)
MCHC: 32.1 g/dL (ref 32.0–36.0)
MCV: 88.1 fL (ref 80.0–100.0)
MPV: 8.8 fL (ref 7.5–12.5)
Monocytes Absolute: 756 cells/uL (ref 200–950)
Monocytes Relative: 6 %
Neutro Abs: 8946 cells/uL — ABNORMAL HIGH (ref 1500–7800)
Neutrophils Relative %: 71 %
Platelets: 303 10*3/uL (ref 140–400)
RBC: 3.11 MIL/uL — ABNORMAL LOW (ref 3.80–5.10)
RDW: 18.6 % — ABNORMAL HIGH (ref 11.0–15.0)
WBC: 12.6 10*3/uL — ABNORMAL HIGH (ref 3.8–10.8)

## 2017-04-03 NOTE — Progress Notes (Signed)
Subjective:    Patient ID: Megan [name], female    DOB: Aug 15, 1960, 57 y.o.   MRN: [id number]  HPI Patient is here today for "a pap smear." Since I last saw the patient, she became HD dependent.  She's had complications from this. She initially attempted peritoneal dialysis however she has "a leak" and had to discontinue this as the fluid was not draining from her abdomen. Friday she is scheduled to receive a temporary venous access placement and she will receive dialysis that evening in the hospital. She is also scheduled to have an AV fistula placed for permanent access at the same time. She is also been placed on the transplant list at [facility]. However according to the protocol she must have an echocardiogram as well as a stress test to be on the transplant list. Although she is asymptomatic, she is requesting that we refer her to see a cardiologist for a stress testing and echocardiogram to that she can qualify for the transplant list.  She is also requesting a Pap smear even though she's had a hysterectomy. She denies any vaginal bleeding or pelvic pain Past Medical History:  Diagnosis Date  . Anemia   . CKD (chronic kidney disease) stage 3, GFR 30-59 ml/min   . Essential hypertension, benign   . GERD (gastroesophageal reflux disease)   . Hemorrhoids   . Mixed hyperlipidemia   . PONV (postoperative nausea and vomiting)   . S/P colonoscopy Jan 2011   Dr. Benson Norway: sessile polyp (benign lymphoid), large hemorrhoids, repeat 5-10 years  . Type 2 diabetes mellitus ([place])    Past Surgical History:  Procedure Laterality Date  . ABDOMINAL HYSTERECTOMY    . APPENDECTOMY    . CATARACT EXTRACTION W/PHACO Left 02/09/2017   Procedure: CATARACT EXTRACTION PHACO AND INTRAOCULAR LENS PLACEMENT LEFT EYE;  Surgeon: Tonny Branch, MD;  Location: [facility];  Service: Ophthalmology;  Laterality: Left;  CDE: 4.89  . CHOLECYSTECTOMY  09/29/2011   Procedure: LAPAROSCOPIC CHOLECYSTECTOMY;  Surgeon: Jamesetta So, MD;  Location: [facility];  Service: General;  Laterality: N/A;  . COLONOSCOPY  Jan 2011   Dr. Benson Norway: sessile polyp (benign lymphoid), large hemorrhoids, repeat 5-10 years  . COLONOSCOPY N/A 06/12/2016   Procedure: COLONOSCOPY;  Surgeon: Daneil Dolin, MD;  Location: [facility];  Service: Endoscopy;  Laterality: N/A;  1230   . ESOPHAGOGASTRODUODENOSCOPY  09/05/2011   KPT:WSFKC hiatal hernia; remainder of exam normal. No explanation for patient's abdominal pain with today's examination  . ESOPHAGOGASTRODUODENOSCOPY N/A 12/17/2013   Dr. [name]: gastric erythema, erosion, mild chronic inflammation on path   . LAPAROSCOPIC APPENDECTOMY  09/29/2011   Procedure: APPENDECTOMY LAPAROSCOPIC;  Surgeon: Jamesetta So, MD;  Location: [facility];  Service: General;;  incidental appendectomy   Current Outpatient Prescriptions on File Prior to Visit  Medication Sig Dispense Refill  . acetaminophen (TYLENOL) 650 MG CR tablet Take 1,300 mg by mouth every 8 (eight) hours as needed for pain.    Marland Kitchen amLODipine (NORVASC) 5 MG tablet TAKE 1 TABLET DAILY 90 tablet 1  . aspirin EC 81 MG tablet Take 81 mg by mouth daily.      Marland Kitchen atorvastatin (LIPITOR) 20 MG tablet TAKE 1 TABLET DAILY (Patient taking differently: TAKE 1 TABLET DAILY AT BEDTIME) 90 tablet 1  . BYSTOLIC 10 MG tablet TAKE 1 TABLET TWICE A DAY 180 tablet 3  . cloNIDine (CATAPRES) 0.2 MG tablet Take 1 tablet (0.2 mg total) by mouth 2 (two) times  daily. (Patient taking differently: Take 0.2 mg by mouth 3 (three) times daily. ) 180 tablet 3  . ferric citrate (AURYXIA) 1 GM 210 MG(Fe) tablet Take 210 mg by mouth 3 (three) times daily with meals.    Marland Kitchen glipiZIDE (GLUCOTROL XL) 5 MG 24 hr tablet Take 5 mg by mouth daily with breakfast.    . hydrALAZINE (APRESOLINE) 25 MG tablet TAKE 1 TABLET THREE TIMES A DAY 270 tablet 3  . losartan (COZAAR) 100 MG tablet TAKE 1 TABLET DAILY 90 tablet 3  . Sodium Citrate Dihydrate 2.7 GM/30ML SOLN Take 30 mLs by mouth daily at 12  noon.     No current facility-administered medications on file prior to visit.      No Known Allergies Social History   Social History  . Marital status: Married    Spouse name: N/A  . Number of children: N/A  . Years of education: N/A   Occupational History  . [employer] # [phone]   Social History Main Topics  . Smoking status: Never Smoker  . Smokeless tobacco: Never Used  . Alcohol use No  . Drug use: No  . Sexual activity: Yes    Birth control/ protection: Surgical   Other Topics Concern  . Not on file   Social History Narrative   Works at [employer] in [place].    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   Also has to get items from high shelves-causes achy pain in shoulder area      Married.   Children are grown, out of house.     Review of Systems  All other systems reviewed and are negative.      Objective:   Physical Exam  Constitutional: She appears well-developed and well-nourished.  Neck: Neck supple. No JVD present. No thyromegaly present.  Cardiovascular: Normal rate, regular rhythm, normal heart sounds and intact distal pulses.  Exam reveals no gallop and no friction rub.   No murmur heard. Pulmonary/Chest: Effort normal and breath sounds normal. No respiratory distress. She has no wheezes. She has no rales.  Abdominal: Soft. Bowel sounds are normal. She exhibits no distension. There is no tenderness. There is no rebound and no guarding.  Genitourinary: There is no rash, tenderness or lesion on the right labia. There is no rash, tenderness or lesion on the left labia. Right adnexum displays no mass and no tenderness. Left adnexum displays no mass and no tenderness. No tenderness or bleeding in the vagina. No signs of injury around the vagina. No vaginal discharge found.    Musculoskeletal: She exhibits no edema.  Vitals reviewed.         Assessment & Plan:  Cervical cancer screening - Plan: PAP, Thin Prep w/HPV rflx HPV  Type 16/18  Controlled type 2 diabetes mellitus with stage 5 chronic kidney disease not on chronic dialysis, without long-term current use of insulin (HCC)  Essential hypertension, benign  Hypertensive kidney disease with CKD (chronic kidney disease) stage V ([place])  Blood pressure today is elevated however she is overdue for dialysis. Therefore I will not adjust her antihypertensive medication is her blood pressure will likely drop substantially after dialysis on Friday. Pneumonia vaccine is up-to-date. She states that the last hemoglobin A1c she had drawn in diameter was 5.8. I am concerned about the patient using glipizide with hemoglobin A1c of 5.8 and now becoming dialysis dependent. I believe her risk of hypoglycemia is becoming extremely high. Therefore I  will check a hemoglobin A1c and if it is confined to be 5.8, I will have the patient discontinue glipizide. She will monitor her fasting and two-hour postprandial sugars and report the values to me in one week. I will check a fasting lipid panel. Goal LDL cholesterol is less than 100. I will also consult cardiology for the required stress test and echocardiogram to qualify for the transplant list.  Pap smear was completed today.

## 2017-04-04 DIAGNOSIS — Z4001 Encounter for prophylactic removal of breast: Secondary | ICD-10-CM | POA: Insufficient documentation

## 2017-04-04 DIAGNOSIS — Z01818 Encounter for other preprocedural examination: Secondary | ICD-10-CM | POA: Insufficient documentation

## 2017-04-04 LAB — LIPID PANEL
Cholesterol: 133 mg/dL (ref <200)
HDL: 41 mg/dL — ABNORMAL LOW (ref >50)
LDL Cholesterol: 65 mg/dL (ref <100)
Total CHOL/HDL Ratio: 3.2 Ratio (ref <5.0)
Triglycerides: 133 mg/dL (ref <150)
VLDL: 27 mg/dL (ref <30)

## 2017-04-04 LAB — COMPLETE METABOLIC PANEL WITH GFR
ALT: 12 U/L (ref 6–29)
AST: 12 U/L (ref 10–35)
Albumin: 3.5 g/dL — ABNORMAL LOW (ref 3.6–5.1)
Alkaline Phosphatase: 92 U/L (ref 33–130)
BUN: 62 mg/dL — ABNORMAL HIGH (ref 7–25)
CO2: 19 mmol/L — ABNORMAL LOW (ref 20–32)
Calcium: 8.6 mg/dL (ref 8.6–10.4)
Chloride: 107 mmol/L (ref 98–110)
Creat: 7.25 mg/dL — ABNORMAL HIGH (ref 0.50–1.05)
GFR, Est African American: 7 mL/min — ABNORMAL LOW (ref >=60)
GFR, Est Non African American: 6 mL/min — ABNORMAL LOW (ref >=60)
Glucose, Bld: 121 mg/dL — ABNORMAL HIGH (ref 70–99)
Potassium: 4.7 mmol/L (ref 3.5–5.3)
Sodium: 142 mmol/L (ref 135–146)
Total Bilirubin: 0.3 mg/dL (ref 0.2–1.2)
Total Protein: 6.4 g/dL (ref 6.1–8.1)

## 2017-04-04 LAB — HEMOGLOBIN A1C
Hgb A1c MFr Bld: 5.6 % (ref <5.7)
Mean Plasma Glucose: 114 mg/dL

## 2017-04-04 LAB — IRON: Iron: 46 ug/dL (ref 45–160)

## 2017-04-05 ENCOUNTER — Other Ambulatory Visit: Payer: Self-pay | Admitting: Family Medicine

## 2017-04-05 ENCOUNTER — Telehealth: Payer: Self-pay | Admitting: Family Medicine

## 2017-04-05 DIAGNOSIS — D509 Iron deficiency anemia, unspecified: Secondary | ICD-10-CM

## 2017-04-05 LAB — VITAMIN B12: Vitamin B-12: 516 pg/mL (ref 200–1100)

## 2017-04-05 NOTE — Telephone Encounter (Signed)
Call patient, elevated sugar may be due to recent surgery.  Monitor over the weekend off glipizide. If elevated sugars persist, resume glipizide next week.

## 2017-04-05 NOTE — Telephone Encounter (Signed)
Patient aware of providers recommendations.  

## 2017-04-05 NOTE — Telephone Encounter (Signed)
Pt called around 8pm, blood sugar spiked to 386, she had recently left the hospital for dialysis port placement and what sounds like possible graft placement as well??. Denied any infections, was not on sterids. Feels fine. States glipizide was recently stopped as A1C was 5.6% She had fast food a few hours later.  With her recent surgery higher risk for infection with high CBG. Advised to take 1/2 tab glipizide but she was unable to break in half, so she took the entire 5mg  of glipizide. She called 2 hours latera around 11pm, CBG was down to 224, still no symptoms.  Will Defer to  PCP , her her ESRD she is checking CBG but may need prn parameters for her blood sugars.

## 2017-04-06 LAB — PAP, THIN PREP W/HPV RFLX HPV TYPE 16/18: HPV DNA High Risk: NOT DETECTED

## 2017-04-11 DIAGNOSIS — N041 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: Secondary | ICD-10-CM | POA: Insufficient documentation

## 2017-04-11 DIAGNOSIS — D689 Coagulation defect, unspecified: Secondary | ICD-10-CM | POA: Insufficient documentation

## 2017-04-11 DIAGNOSIS — D61818 Other pancytopenia: Secondary | ICD-10-CM | POA: Insufficient documentation

## 2017-04-11 DIAGNOSIS — L299 Pruritus, unspecified: Secondary | ICD-10-CM | POA: Insufficient documentation

## 2017-04-11 DIAGNOSIS — R52 Pain, unspecified: Secondary | ICD-10-CM | POA: Insufficient documentation

## 2017-04-11 DIAGNOSIS — E872 Acidosis, unspecified: Secondary | ICD-10-CM | POA: Insufficient documentation

## 2017-04-11 DIAGNOSIS — R809 Proteinuria, unspecified: Secondary | ICD-10-CM | POA: Insufficient documentation

## 2017-04-11 DIAGNOSIS — R944 Abnormal results of kidney function studies: Secondary | ICD-10-CM | POA: Insufficient documentation

## 2017-04-11 DIAGNOSIS — D649 Anemia, unspecified: Secondary | ICD-10-CM | POA: Insufficient documentation

## 2017-04-11 DIAGNOSIS — N19 Unspecified kidney failure: Secondary | ICD-10-CM | POA: Insufficient documentation

## 2017-04-11 DIAGNOSIS — I1311 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, with stage 5 chronic kidney disease, or end stage renal disease: Secondary | ICD-10-CM | POA: Insufficient documentation

## 2017-04-12 DIAGNOSIS — D509 Iron deficiency anemia, unspecified: Secondary | ICD-10-CM | POA: Insufficient documentation

## 2017-04-12 DIAGNOSIS — D631 Anemia in chronic kidney disease: Secondary | ICD-10-CM | POA: Insufficient documentation

## 2017-04-17 ENCOUNTER — Encounter: Payer: Self-pay | Admitting: *Deleted

## 2017-04-17 ENCOUNTER — Encounter: Payer: Self-pay | Admitting: Family Medicine

## 2017-04-17 ENCOUNTER — Ambulatory Visit (INDEPENDENT_AMBULATORY_CARE_PROVIDER_SITE_OTHER): Admitting: Family Medicine

## 2017-04-17 VITALS — BP 150/88 | HR 86 | Temp 97.9°F | Resp 18 | Ht 62.0 in | Wt 171.0 lb

## 2017-04-17 DIAGNOSIS — D631 Anemia in chronic kidney disease: Secondary | ICD-10-CM | POA: Diagnosis not present

## 2017-04-17 DIAGNOSIS — E1122 Type 2 diabetes mellitus with diabetic chronic kidney disease: Secondary | ICD-10-CM

## 2017-04-17 DIAGNOSIS — N185 Chronic kidney disease, stage 5: Secondary | ICD-10-CM | POA: Diagnosis not present

## 2017-04-17 DIAGNOSIS — N189 Chronic kidney disease, unspecified: Secondary | ICD-10-CM | POA: Diagnosis not present

## 2017-04-17 NOTE — Progress Notes (Signed)
Subjective:    Patient ID: Megan [name], female    DOB: 1960-12-24, 56 y.o.   MRN: [id number]  HPI  04/03/17 Patient is here today for "a pap smear." Since I last saw the patient, she became HD dependent.  She's had complications from this. She initially attempted peritoneal dialysis however she has "a leak" and had to discontinue this as the fluid was not draining from her abdomen. Friday she is scheduled to receive a temporary venous access placement and she will receive dialysis that evening in the hospital. She is also scheduled to have an AV fistula placed for permanent access at the same time. She is also been placed on the transplant list at [facility] However according to the protocol she must have an echocardiogram as well as a stress test to be on the transplant list. Although she is asymptomatic, she is requesting that we refer her to see a cardiologist for a stress testing and echocardiogram to that she can qualify for the transplant list.  She is also requesting a Pap smear even though she's had a hysterectomy. She denies any vaginal bleeding or pelvic pain.  At that time, my plan was: Blood pressure today is elevated however she is overdue for dialysis. Therefore I will not adjust her antihypertensive medication is her blood pressure will likely drop substantially after dialysis on Friday. Pneumonia vaccine is up-to-date. She states that the last hemoglobin A1c she had drawn in diameter was 5.8. I am concerned about the patient using glipizide with hemoglobin A1c of 5.8 and now becoming dialysis dependent. I believe her risk of hypoglycemia is becoming extremely high. Therefore I will check a hemoglobin A1c and if it is confined to be 5.8, I will have the patient discontinue glipizide. She will monitor her fasting and two-hour postprandial sugars and report the values to me in one week. I will check a fasting lipid panel. Goal LDL cholesterol is less than 100. I will also consult  cardiology for the required stress test and echocardiogram to qualify for the transplant list.  Pap smear was completed today.  04/17/17 Hgb was found to be 8.8 with iron level of 46.  Her hemoglobin A1c was found to be 5.6. Therefore I asked the patient to discontinue glipizide. One day off the glipizide, her blood sugar was in the 200s to 300 range. However there were some complications as she venous access placed that day in the hospital and she may have received dextrose through her IV. Therefore we asked the patient to remain off the glipizide and record her sugars for an additional 7 days. Her fasting blood sugars are between 121 and 142. Her two-hour postprandial sugars are between 117 and 204 with the remainder of the sugars in the 130s to 180s. This is without medication. I believe this is acceptable and provides her a buffer for safety. However there is still confusion today regarding what we are doing about her anemia. Her medicine list has iron tablets on it. However the patient states she's not taking iron tablets. However she does believe that she is receiving an iron infusion once a month. She is uncertain of whether she is receiving erythropoietin. She states that she believes she has received iron infusion for the last 2-3 months. Past Medical History:  Diagnosis Date  . Anemia   . CKD (chronic kidney disease) stage 3, GFR 30-59 ml/min   . Essential hypertension, benign   . GERD (gastroesophageal reflux disease)   .  Hemorrhoids   . Mixed hyperlipidemia   . PONV (postoperative nausea and vomiting)   . S/P colonoscopy Jan 2011   Dr. Benson Norway: sessile polyp (benign lymphoid), large hemorrhoids, repeat 5-10 years  . Type 2 diabetes mellitus ([place])    Past Surgical History:  Procedure Laterality Date  . ABDOMINAL HYSTERECTOMY    . APPENDECTOMY    . CATARACT EXTRACTION W/PHACO Left 02/09/2017   Procedure: CATARACT EXTRACTION PHACO AND INTRAOCULAR LENS PLACEMENT LEFT EYE;  Surgeon: Tonny Branch, MD;  Location: [facility];  Service: Ophthalmology;  Laterality: Left;  CDE: 4.89  . CHOLECYSTECTOMY  09/29/2011   Procedure: LAPAROSCOPIC CHOLECYSTECTOMY;  Surgeon: Jamesetta So, MD;  Location: [facility];  Service: General;  Laterality: N/A;  . COLONOSCOPY  Jan 2011   Dr. Benson Norway: sessile polyp (benign lymphoid), large hemorrhoids, repeat 5-10 years  . COLONOSCOPY N/A 06/12/2016   Procedure: COLONOSCOPY;  Surgeon: Daneil Dolin, MD;  Location: [facility];  Service: Endoscopy;  Laterality: N/A;  1230   . ESOPHAGOGASTRODUODENOSCOPY  09/05/2011   EPP:IRJJO hiatal hernia; remainder of exam normal. No explanation for patient's abdominal pain with today's examination  . ESOPHAGOGASTRODUODENOSCOPY N/A 12/17/2013   Dr. [name]: gastric erythema, erosion, mild chronic inflammation on path   . LAPAROSCOPIC APPENDECTOMY  09/29/2011   Procedure: APPENDECTOMY LAPAROSCOPIC;  Surgeon: Jamesetta So, MD;  Location: [facility];  Service: General;;  incidental appendectomy   Current Outpatient Prescriptions on File Prior to Visit  Medication Sig Dispense Refill  . acetaminophen (TYLENOL) 650 MG CR tablet Take 1,300 mg by mouth every 8 (eight) hours as needed for pain.    Marland Kitchen amLODipine (NORVASC) 5 MG tablet TAKE 1 TABLET DAILY 90 tablet 1  . aspirin EC 81 MG tablet Take 81 mg by mouth daily.      Marland Kitchen atorvastatin (LIPITOR) 20 MG tablet TAKE 1 TABLET DAILY (Patient taking differently: TAKE 1 TABLET DAILY AT BEDTIME) 90 tablet 1  . BYSTOLIC 10 MG tablet TAKE 1 TABLET TWICE A DAY 180 tablet 3  . cloNIDine (CATAPRES) 0.2 MG tablet Take 1 tablet (0.2 mg total) by mouth 2 (two) times daily. (Patient taking differently: Take 0.2 mg by mouth 3 (three) times daily. ) 180 tablet 3  . ferric citrate (AURYXIA) 1 GM 210 MG(Fe) tablet Take 210 mg by mouth 3 (three) times daily with meals.    Marland Kitchen glipiZIDE (GLUCOTROL XL) 5 MG 24 hr tablet Take 5 mg by mouth daily with breakfast.    . hydrALAZINE (APRESOLINE) 25 MG tablet TAKE 1 TABLET  THREE TIMES A DAY 270 tablet 3  . losartan (COZAAR) 100 MG tablet TAKE 1 TABLET DAILY 90 tablet 3  . Sodium Citrate Dihydrate 2.7 GM/30ML SOLN Take 30 mLs by mouth daily at 12 noon.     No current facility-administered medications on file prior to visit.      No Known Allergies Social History   Social History  . Marital status: Married    Spouse name: N/A  . Number of children: N/A  . Years of education: N/A   Occupational History  . [employer] # [phone]   Social History Main Topics  . Smoking status: Never Smoker  . Smokeless tobacco: Never Used  . Alcohol use No  . Drug use: No  . Sexual activity: Yes    Birth control/ protection: Surgical   Other Topics Concern  . Not on file   Social History Narrative   Works at [employer] in  [place].    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   Also has to get items from high shelves-causes achy pain in shoulder area      Married.   Children are grown, out of house.     Review of Systems  All other systems reviewed and are negative.      Objective:   Physical Exam  Constitutional: She appears well-developed and well-nourished.  Neck: Neck supple. No JVD present. No thyromegaly present.  Cardiovascular: Normal rate, regular rhythm, normal heart sounds and intact distal pulses.  Exam reveals no gallop and no friction rub.   No murmur heard. Pulmonary/Chest: Effort normal and breath sounds normal. No respiratory distress. She has no wheezes. She has no rales.  Abdominal: Soft. Bowel sounds are normal. She exhibits no distension. There is no tenderness. There is no rebound and no guarding.  Genitourinary: There is no rash, tenderness or lesion on the right labia. There is no rash, tenderness or lesion on the left labia. No tenderness or bleeding in the vagina. No signs of injury around the vagina. No vaginal discharge found.  Musculoskeletal: She exhibits no edema.  Vitals reviewed.         Assessment  & Plan:  Controlled type 2 diabetes mellitus with stage 5 chronic kidney disease not on chronic dialysis, without long-term current use of insulin (HCC)  Anemia due to chronic kidney disease  I am satisfied with her blood sugars. I will have the patient refrain from using glipizide and monitor her sugars as she is doing and recheck a hemoglobin A1c in 3 months. I want to avoid hypoglycemia given the fact she is now dialysis dependent. However I feel follow-up is necessary regarding her anemia and I believe her iron deficiency anemia is secondary to her chronic kidney disease. She may require erythropoietin injections. I have asked the patient to go home today and schedule appointment to see her nephrologist to discuss her anemia. I also want her to call and clarify for me what the plan is regarding her anemia and let me know so that we can recheck her hemoglobin in 3 months after she is receiving either iron replacement or erythropoietin injections.I have not received information from her nephrologist regarding her anemia.  Therefore, I will also have my office try to get her records from her nephrologist for me to review.

## 2017-05-03 ENCOUNTER — Ambulatory Visit (INDEPENDENT_AMBULATORY_CARE_PROVIDER_SITE_OTHER): Admitting: Cardiology

## 2017-05-03 ENCOUNTER — Encounter: Payer: Self-pay | Admitting: Cardiology

## 2017-05-03 VITALS — BP 138/80 | HR 88 | Ht 62.0 in | Wt 165.0 lb

## 2017-05-03 DIAGNOSIS — Z01810 Encounter for preprocedural cardiovascular examination: Secondary | ICD-10-CM

## 2017-05-03 NOTE — Progress Notes (Signed)
Clinical Summary Ms. Paulding is a 56 y.o.female seen today as a new patient. Last seen by Dr Domenic Polite 4 years ago. She is seen as a new consult, referred by Dr Dennard Schaumann for cardiac workup for possibly kidney transplant.   1. CKD IV/Preoperative evaluation - ER visit 03/2017 with chest pain and abdominal pain shortly after starting peritoneal dialysis. Aytpical for cardiac etiology, no recurrent symptoms.  - as part of kidney transplant consideration at [facility] she requires an echo and stress test.  - no recent chest pain. No SOB/DOE. Highest level of activity is washing dishes, which causes some SOB. This is chronic - No recent edema - Past Medical History:  Diagnosis Date  . Anemia   . CKD (chronic kidney disease) stage 3, GFR 30-59 ml/min   . Essential hypertension, benign   . GERD (gastroesophageal reflux disease)   . Hemorrhoids   . Mixed hyperlipidemia   . PONV (postoperative nausea and vomiting)   . S/P colonoscopy Jan 2011   Dr. Benson Norway: sessile polyp (benign lymphoid), large hemorrhoids, repeat 5-10 years  . Type 2 diabetes mellitus (HCC)      No Known Allergies   Current Outpatient Prescriptions  Medication Sig Dispense Refill  . amLODipine (NORVASC) 5 MG tablet TAKE 1 TABLET DAILY (Patient taking differently: TAKE 1 TABLET DAILY AT BEDTIME) 90 tablet 1  . aspirin EC 81 MG tablet Take 81 mg by mouth daily.      Marland Kitchen atorvastatin (LIPITOR) 20 MG tablet TAKE 1 TABLET DAILY (Patient taking differently: TAKE 1 TABLET DAILY AT BEDTIME) 90 tablet 1  . BYSTOLIC 10 MG tablet TAKE 1 TABLET TWICE A DAY 180 tablet 3  . calcium acetate (PHOSLO) 667 MG capsule Take 667 mg by mouth 4 (four) times daily.    . cloNIDine (CATAPRES) 0.2 MG tablet Take 1 tablet (0.2 mg total) by mouth 2 (two) times daily. (Patient taking differently: Take 0.2 mg by mouth 3 (three) times daily. ) 180 tablet 3  . hydrALAZINE (APRESOLINE) 25 MG tablet TAKE 1 TABLET THREE TIMES A DAY 270 tablet 3  . lactulose  (CHRONULAC) 10 GM/15ML solution Take 30 g by mouth daily.    Marland Kitchen losartan (COZAAR) 100 MG tablet TAKE 1 TABLET DAILY 90 tablet 3   No current facility-administered medications for this visit.      Past Surgical History:  Procedure Laterality Date  . ABDOMINAL HYSTERECTOMY    . APPENDECTOMY    . CATARACT EXTRACTION W/PHACO Left 02/09/2017   Procedure: CATARACT EXTRACTION PHACO AND INTRAOCULAR LENS PLACEMENT LEFT EYE;  Surgeon: Tonny , MD;  Location: [facility];  Service: Ophthalmology;  Laterality: Left;  CDE: 4.89  . CHOLECYSTECTOMY  09/29/2011   Procedure: LAPAROSCOPIC CHOLECYSTECTOMY;  Surgeon: Jamesetta So, MD;  Location: [facility];  Service: General;  Laterality: N/A;  . COLONOSCOPY  Jan 2011   Dr. Benson Norway: sessile polyp (benign lymphoid), large hemorrhoids, repeat 5-10 years  . COLONOSCOPY N/A 06/12/2016   Procedure: COLONOSCOPY;  Surgeon: Daneil Dolin, MD;  Location: [facility];  Service: Endoscopy;  Laterality: N/A;  1230   . ESOPHAGOGASTRODUODENOSCOPY  09/05/2011   UQJ:FHLKT hiatal hernia; remainder of exam normal. No explanation for patient's abdominal pain with today's examination  . ESOPHAGOGASTRODUODENOSCOPY N/A 12/17/2013   Dr. [name]: gastric erythema, erosion, mild chronic inflammation on path   . LAPAROSCOPIC APPENDECTOMY  09/29/2011   Procedure: APPENDECTOMY LAPAROSCOPIC;  Surgeon: Jamesetta So, MD;  Location: [facility];  Service: General;;  incidental  appendectomy     No Known Allergies    Family History  Problem Relation Age of Onset  . Hypertension Mother   . Coronary artery disease Mother   . Diabetes Mother   . Hypertension Sister   . Coronary artery disease Sister   . Hypertension Brother   . Colon cancer Neg Hx      Social History Ms. Hearty reports that she has never smoked. She has never used smokeless tobacco. Ms. Anschutz reports that she does not drink alcohol.   Review of Systems CONSTITUTIONAL: No weight loss, fever, chills, weakness or  fatigue.  HEENT: Eyes: No visual loss, blurred vision, double vision or yellow sclerae.No hearing loss, sneezing, congestion, runny nose or sore throat.  SKIN: No rash or itching.  CARDIOVASCULAR: per hpi RESPIRATORY: per hpi GASTROINTESTINAL: No anorexia, nausea, vomiting or diarrhea. No abdominal pain or blood.  GENITOURINARY: No burning on urination, no polyuria NEUROLOGICAL: No headache, dizziness, syncope, paralysis, ataxia, numbness or tingling in the extremities. No change in bowel or bladder control.  MUSCULOSKELETAL: No muscle, back pain, joint pain or stiffness.  LYMPHATICS: No enlarged nodes. No history of splenectomy.  PSYCHIATRIC: No history of depression or anxiety.  ENDOCRINOLOGIC: No reports of sweating, cold or heat intolerance. No polyuria or polydipsia.  Marland Kitchen   Physical Examination Vitals:   05/03/17 0902  BP: 138/80  Pulse: 88  SpO2: 95%   Filed Weights   05/03/17 0902  Weight: 165 lb (74.8 kg)    Gen: resting comfortably, no acute distress HEENT: no scleral icterus, pupils equal round and reactive, no palptable cervical adenopathy,  CV: RRR, no m/r/g, no jvd Resp: Clear to auscultation bilaterally GI: abdomen is soft, non-tender, non-distended, normal bowel sounds, no hepatosplenomegaly MSK: extremities are warm, no edema.  Skin: warm, no rash Neuro:  no focal deficits Psych: appropriate affect      Assessment and Plan  1. Preoperative evaluation - as part of kidney  transplant evaluation at [facility] we have been asked to arrange an outpatient echo and stress test - she denies any current cardiopulmonary symptoms - we will start with echo, pending results likely would obtain a lexiscan MPI to evaluate for ischemia.       Arnoldo Lenis, M.D

## 2017-05-03 NOTE — Patient Instructions (Signed)
Medication Instructions:  Your physician recommends that you continue on your current medications as directed. Please refer to the Current Medication list given to you today.   Labwork: NONE   Testing/Procedures: Your physician has requested that you have an echocardiogram. Echocardiography is a painless test that uses sound waves to create images of your heart. It provides your doctor with information about the size and shape of your heart and how well your heart's chambers and valves are working. This procedure takes approximately one hour. There are no restrictions for this procedure.    Follow-Up: Your physician recommends that you schedule a follow-up appointment pending ECHO results.    Any Other Special Instructions Will Be Listed Below (If Applicable).     If you need a refill on your cardiac medications before your next appointment, please call your pharmacy. Thank you for choosing [facility]!

## 2017-05-08 ENCOUNTER — Ambulatory Visit (HOSPITAL_COMMUNITY)
Admission: RE | Admit: 2017-05-08 | Discharge: 2017-05-08 | Disposition: A | Discharge status: Home / Self Care | Source: Ambulatory Visit | Attending: Cardiology | Admitting: Cardiology

## 2017-05-08 DIAGNOSIS — E785 Hyperlipidemia, unspecified: Secondary | ICD-10-CM | POA: Insufficient documentation

## 2017-05-08 DIAGNOSIS — N183 Chronic kidney disease, stage 3 (moderate): Secondary | ICD-10-CM | POA: Diagnosis not present

## 2017-05-08 DIAGNOSIS — I129 Hypertensive chronic kidney disease with stage 1 through stage 4 chronic kidney disease, or unspecified chronic kidney disease: Secondary | ICD-10-CM | POA: Insufficient documentation

## 2017-05-08 DIAGNOSIS — E1122 Type 2 diabetes mellitus with diabetic chronic kidney disease: Secondary | ICD-10-CM | POA: Insufficient documentation

## 2017-05-08 DIAGNOSIS — K219 Gastro-esophageal reflux disease without esophagitis: Secondary | ICD-10-CM | POA: Insufficient documentation

## 2017-05-08 DIAGNOSIS — Z01810 Encounter for preprocedural cardiovascular examination: Secondary | ICD-10-CM | POA: Diagnosis not present

## 2017-05-08 NOTE — Progress Notes (Signed)
*  PRELIMINARY RESULTS* Echocardiogram 2D Echocardiogram has been performed.  Megan [name] 05/08/2017, 9:09 AM

## 2017-05-14 ENCOUNTER — Telehealth: Payer: Self-pay

## 2017-05-14 DIAGNOSIS — E1129 Type 2 diabetes mellitus with other diabetic kidney complication: Secondary | ICD-10-CM | POA: Diagnosis not present

## 2017-05-14 DIAGNOSIS — N186 End stage renal disease: Secondary | ICD-10-CM | POA: Diagnosis not present

## 2017-05-14 DIAGNOSIS — Z23 Encounter for immunization: Secondary | ICD-10-CM | POA: Diagnosis not present

## 2017-05-14 DIAGNOSIS — Z01818 Encounter for other preprocedural examination: Secondary | ICD-10-CM

## 2017-05-14 DIAGNOSIS — N2581 Secondary hyperparathyroidism of renal origin: Secondary | ICD-10-CM | POA: Diagnosis not present

## 2017-05-14 DIAGNOSIS — D631 Anemia in chronic kidney disease: Secondary | ICD-10-CM | POA: Diagnosis not present

## 2017-05-14 DIAGNOSIS — E875 Hyperkalemia: Secondary | ICD-10-CM | POA: Diagnosis not present

## 2017-05-14 DIAGNOSIS — E119 Type 2 diabetes mellitus without complications: Secondary | ICD-10-CM | POA: Diagnosis not present

## 2017-05-14 NOTE — Telephone Encounter (Signed)
-----   Message from Arnoldo Lenis, MD sent at 05/14/2017  2:52 PM EDT ----- Echo looks good. 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

## 2017-05-14 NOTE — Telephone Encounter (Signed)
Spoke with pt. Gave her test results, informed her that Dr. [name] ordered a lexiscan myoview stress test. She voiced understanding.

## 2017-05-17 DIAGNOSIS — N186 End stage renal disease: Secondary | ICD-10-CM | POA: Diagnosis not present

## 2017-05-17 DIAGNOSIS — N2581 Secondary hyperparathyroidism of renal origin: Secondary | ICD-10-CM | POA: Diagnosis not present

## 2017-05-17 DIAGNOSIS — E875 Hyperkalemia: Secondary | ICD-10-CM | POA: Diagnosis not present

## 2017-05-17 DIAGNOSIS — E119 Type 2 diabetes mellitus without complications: Secondary | ICD-10-CM | POA: Diagnosis not present

## 2017-05-17 DIAGNOSIS — E1129 Type 2 diabetes mellitus with other diabetic kidney complication: Secondary | ICD-10-CM | POA: Diagnosis not present

## 2017-05-17 DIAGNOSIS — D631 Anemia in chronic kidney disease: Secondary | ICD-10-CM | POA: Diagnosis not present

## 2017-05-19 DIAGNOSIS — N2581 Secondary hyperparathyroidism of renal origin: Secondary | ICD-10-CM | POA: Diagnosis not present

## 2017-05-19 DIAGNOSIS — E119 Type 2 diabetes mellitus without complications: Secondary | ICD-10-CM | POA: Diagnosis not present

## 2017-05-19 DIAGNOSIS — E875 Hyperkalemia: Secondary | ICD-10-CM | POA: Diagnosis not present

## 2017-05-19 DIAGNOSIS — D631 Anemia in chronic kidney disease: Secondary | ICD-10-CM | POA: Diagnosis not present

## 2017-05-19 DIAGNOSIS — E1129 Type 2 diabetes mellitus with other diabetic kidney complication: Secondary | ICD-10-CM | POA: Diagnosis not present

## 2017-05-19 DIAGNOSIS — N186 End stage renal disease: Secondary | ICD-10-CM | POA: Diagnosis not present

## 2017-05-22 DIAGNOSIS — N186 End stage renal disease: Secondary | ICD-10-CM | POA: Diagnosis not present

## 2017-05-22 DIAGNOSIS — E875 Hyperkalemia: Secondary | ICD-10-CM | POA: Diagnosis not present

## 2017-05-22 DIAGNOSIS — D631 Anemia in chronic kidney disease: Secondary | ICD-10-CM | POA: Diagnosis not present

## 2017-05-22 DIAGNOSIS — N2581 Secondary hyperparathyroidism of renal origin: Secondary | ICD-10-CM | POA: Diagnosis not present

## 2017-05-22 DIAGNOSIS — E119 Type 2 diabetes mellitus without complications: Secondary | ICD-10-CM | POA: Diagnosis not present

## 2017-05-22 DIAGNOSIS — E1129 Type 2 diabetes mellitus with other diabetic kidney complication: Secondary | ICD-10-CM | POA: Diagnosis not present

## 2017-05-24 DIAGNOSIS — D631 Anemia in chronic kidney disease: Secondary | ICD-10-CM | POA: Diagnosis not present

## 2017-05-24 DIAGNOSIS — N186 End stage renal disease: Secondary | ICD-10-CM | POA: Diagnosis not present

## 2017-05-24 DIAGNOSIS — N2581 Secondary hyperparathyroidism of renal origin: Secondary | ICD-10-CM | POA: Diagnosis not present

## 2017-05-24 DIAGNOSIS — E119 Type 2 diabetes mellitus without complications: Secondary | ICD-10-CM | POA: Diagnosis not present

## 2017-05-24 DIAGNOSIS — E875 Hyperkalemia: Secondary | ICD-10-CM | POA: Diagnosis not present

## 2017-05-24 DIAGNOSIS — E1129 Type 2 diabetes mellitus with other diabetic kidney complication: Secondary | ICD-10-CM | POA: Diagnosis not present

## 2017-05-27 DIAGNOSIS — N2581 Secondary hyperparathyroidism of renal origin: Secondary | ICD-10-CM | POA: Diagnosis not present

## 2017-05-27 DIAGNOSIS — N186 End stage renal disease: Secondary | ICD-10-CM | POA: Diagnosis not present

## 2017-05-27 DIAGNOSIS — E875 Hyperkalemia: Secondary | ICD-10-CM | POA: Diagnosis not present

## 2017-05-27 DIAGNOSIS — E119 Type 2 diabetes mellitus without complications: Secondary | ICD-10-CM | POA: Diagnosis not present

## 2017-05-27 DIAGNOSIS — E1129 Type 2 diabetes mellitus with other diabetic kidney complication: Secondary | ICD-10-CM | POA: Diagnosis not present

## 2017-05-27 DIAGNOSIS — D631 Anemia in chronic kidney disease: Secondary | ICD-10-CM | POA: Diagnosis not present

## 2017-05-29 DIAGNOSIS — D631 Anemia in chronic kidney disease: Secondary | ICD-10-CM | POA: Diagnosis not present

## 2017-05-29 DIAGNOSIS — E119 Type 2 diabetes mellitus without complications: Secondary | ICD-10-CM | POA: Diagnosis not present

## 2017-05-29 DIAGNOSIS — N186 End stage renal disease: Secondary | ICD-10-CM | POA: Diagnosis not present

## 2017-05-29 DIAGNOSIS — E875 Hyperkalemia: Secondary | ICD-10-CM | POA: Diagnosis not present

## 2017-05-29 DIAGNOSIS — E1129 Type 2 diabetes mellitus with other diabetic kidney complication: Secondary | ICD-10-CM | POA: Diagnosis not present

## 2017-05-29 DIAGNOSIS — N2581 Secondary hyperparathyroidism of renal origin: Secondary | ICD-10-CM | POA: Diagnosis not present

## 2017-05-30 ENCOUNTER — Encounter (HOSPITAL_COMMUNITY)
Admission: RE | Admit: 2017-05-30 | Discharge: 2017-05-30 | Disposition: A | Discharge status: Home / Self Care | Payer: Medicare Other | Source: Ambulatory Visit | Attending: Ophthalmology | Admitting: Ophthalmology

## 2017-05-30 ENCOUNTER — Encounter (HOSPITAL_COMMUNITY): Payer: Self-pay

## 2017-05-31 DIAGNOSIS — E1129 Type 2 diabetes mellitus with other diabetic kidney complication: Secondary | ICD-10-CM | POA: Diagnosis not present

## 2017-05-31 DIAGNOSIS — D631 Anemia in chronic kidney disease: Secondary | ICD-10-CM | POA: Diagnosis not present

## 2017-05-31 DIAGNOSIS — E119 Type 2 diabetes mellitus without complications: Secondary | ICD-10-CM | POA: Diagnosis not present

## 2017-05-31 DIAGNOSIS — E875 Hyperkalemia: Secondary | ICD-10-CM | POA: Diagnosis not present

## 2017-05-31 DIAGNOSIS — N186 End stage renal disease: Secondary | ICD-10-CM | POA: Diagnosis not present

## 2017-05-31 DIAGNOSIS — N2581 Secondary hyperparathyroidism of renal origin: Secondary | ICD-10-CM | POA: Diagnosis not present

## 2017-06-02 DIAGNOSIS — D631 Anemia in chronic kidney disease: Secondary | ICD-10-CM | POA: Diagnosis not present

## 2017-06-02 DIAGNOSIS — E875 Hyperkalemia: Secondary | ICD-10-CM | POA: Diagnosis not present

## 2017-06-02 DIAGNOSIS — N2581 Secondary hyperparathyroidism of renal origin: Secondary | ICD-10-CM | POA: Diagnosis not present

## 2017-06-02 DIAGNOSIS — E119 Type 2 diabetes mellitus without complications: Secondary | ICD-10-CM | POA: Diagnosis not present

## 2017-06-02 DIAGNOSIS — E1129 Type 2 diabetes mellitus with other diabetic kidney complication: Secondary | ICD-10-CM | POA: Diagnosis not present

## 2017-06-02 DIAGNOSIS — N186 End stage renal disease: Secondary | ICD-10-CM | POA: Diagnosis not present

## 2017-06-04 ENCOUNTER — Ambulatory Visit (HOSPITAL_COMMUNITY): Payer: Medicare Other | Admitting: Anesthesiology

## 2017-06-04 ENCOUNTER — Ambulatory Visit (HOSPITAL_COMMUNITY)
Admission: RE | Admit: 2017-06-04 | Discharge: 2017-06-04 | Disposition: A | Discharge status: Home / Self Care | Payer: Medicare Other | Source: Ambulatory Visit | Attending: Ophthalmology | Admitting: Ophthalmology

## 2017-06-04 ENCOUNTER — Encounter (HOSPITAL_COMMUNITY)
Admission: RE | Disposition: A | Discharge status: Home / Self Care | Payer: Self-pay | Source: Ambulatory Visit | Attending: Ophthalmology

## 2017-06-04 ENCOUNTER — Encounter (HOSPITAL_COMMUNITY): Payer: Self-pay

## 2017-06-04 DIAGNOSIS — K219 Gastro-esophageal reflux disease without esophagitis: Secondary | ICD-10-CM | POA: Diagnosis not present

## 2017-06-04 DIAGNOSIS — H35033 Hypertensive retinopathy, bilateral: Secondary | ICD-10-CM | POA: Insufficient documentation

## 2017-06-04 DIAGNOSIS — E7800 Pure hypercholesterolemia, unspecified: Secondary | ICD-10-CM | POA: Insufficient documentation

## 2017-06-04 DIAGNOSIS — Z7982 Long term (current) use of aspirin: Secondary | ICD-10-CM | POA: Diagnosis not present

## 2017-06-04 DIAGNOSIS — Z79899 Other long term (current) drug therapy: Secondary | ICD-10-CM | POA: Diagnosis not present

## 2017-06-04 DIAGNOSIS — E1122 Type 2 diabetes mellitus with diabetic chronic kidney disease: Secondary | ICD-10-CM | POA: Diagnosis not present

## 2017-06-04 DIAGNOSIS — I120 Hypertensive chronic kidney disease with stage 5 chronic kidney disease or end stage renal disease: Secondary | ICD-10-CM | POA: Insufficient documentation

## 2017-06-04 DIAGNOSIS — H2511 Age-related nuclear cataract, right eye: Secondary | ICD-10-CM | POA: Insufficient documentation

## 2017-06-04 DIAGNOSIS — Z7984 Long term (current) use of oral hypoglycemic drugs: Secondary | ICD-10-CM | POA: Diagnosis not present

## 2017-06-04 DIAGNOSIS — N186 End stage renal disease: Secondary | ICD-10-CM | POA: Insufficient documentation

## 2017-06-04 DIAGNOSIS — Z992 Dependence on renal dialysis: Secondary | ICD-10-CM | POA: Diagnosis not present

## 2017-06-04 DIAGNOSIS — Z884 Allergy status to anesthetic agent status: Secondary | ICD-10-CM | POA: Insufficient documentation

## 2017-06-04 HISTORY — PX: CATARACT EXTRACTION W/PHACO: SHX586

## 2017-06-04 LAB — POCT I-STAT, CHEM 8
BUN: 40 mg/dL — ABNORMAL HIGH (ref 6–20)
Calcium, Ion: 1.16 mmol/L (ref 1.15–1.40)
Chloride: 100 mmol/L — ABNORMAL LOW (ref 101–111)
Creatinine, Ser: 6.9 mg/dL — ABNORMAL HIGH (ref 0.44–1.00)
Glucose, Bld: 128 mg/dL — ABNORMAL HIGH (ref 65–99)
HCT: 36 % (ref 36.0–46.0)
Hemoglobin: 12.2 g/dL (ref 12.0–15.0)
Potassium: 3.6 mmol/L (ref 3.5–5.1)
Sodium: 141 mmol/L (ref 135–145)
TCO2: 28 mmol/L (ref 22–32)

## 2017-06-04 SURGERY — PHACOEMULSIFICATION, CATARACT, WITH IOL INSERTION
Anesthesia: Monitor Anesthesia Care | Site: Eye | Laterality: Right

## 2017-06-04 MED ORDER — SODIUM CHLORIDE 0.9 % IV SOLN
INTRAVENOUS | Status: DC
  Administered 2017-06-04: 11:00:00 via INTRAVENOUS

## 2017-06-04 MED ORDER — MIDAZOLAM HCL 2 MG/2ML IJ SOLN
INTRAMUSCULAR | Status: AC
  Filled 2017-06-04: qty 2

## 2017-06-04 MED ORDER — BSS IO SOLN
INTRAOCULAR | Status: DC | PRN
  Administered 2017-06-04: 500 mL

## 2017-06-04 MED ORDER — LIDOCAINE HCL (PF) 1 % IJ SOLN
INTRAMUSCULAR | Status: DC | PRN
  Administered 2017-06-04: .5 mL

## 2017-06-04 MED ORDER — ONDANSETRON 4 MG PO TBDP
4.0000 mg | ORAL_TABLET | Freq: Once | ORAL | Status: AC
  Administered 2017-06-04: 4 mg via ORAL

## 2017-06-04 MED ORDER — POVIDONE-IODINE 5 % OP SOLN
OPHTHALMIC | Status: DC | PRN
  Administered 2017-06-04: 1 via OPHTHALMIC

## 2017-06-04 MED ORDER — LIDOCAINE HCL 3.5 % OP GEL
1.0000 "application " | Freq: Once | OPHTHALMIC | Status: AC
  Administered 2017-06-04: 1 via OPHTHALMIC

## 2017-06-04 MED ORDER — MIDAZOLAM HCL 2 MG/2ML IJ SOLN
1.0000 mg | Freq: Once | INTRAMUSCULAR | Status: AC | PRN
  Administered 2017-06-04: 2 mg via INTRAVENOUS

## 2017-06-04 MED ORDER — ONDANSETRON 4 MG PO TBDP
ORAL_TABLET | ORAL | Status: AC
Start: 2017-06-04 — End: ?
  Filled 2017-06-04: qty 1

## 2017-06-04 MED ORDER — TETRACAINE HCL 0.5 % OP SOLN
1.0000 [drp] | OPHTHALMIC | Status: AC
  Administered 2017-06-04 (×3): 1 [drp] via OPHTHALMIC

## 2017-06-04 MED ORDER — NEOMYCIN-POLYMYXIN-DEXAMETH 3.5-10000-0.1 OP SUSP
OPHTHALMIC | Status: DC | PRN
  Administered 2017-06-04: 2 [drp] via OPHTHALMIC

## 2017-06-04 MED ORDER — PROVISC 10 MG/ML IO SOLN
INTRAOCULAR | Status: DC | PRN
  Administered 2017-06-04: 0.85 mL via INTRAOCULAR

## 2017-06-04 MED ORDER — PHENYLEPHRINE HCL 2.5 % OP SOLN
1.0000 [drp] | OPHTHALMIC | Status: AC
  Administered 2017-06-04 (×3): 1 [drp] via OPHTHALMIC

## 2017-06-04 MED ORDER — BSS IO SOLN
INTRAOCULAR | Status: DC | PRN
  Administered 2017-06-04: 15 mL

## 2017-06-04 MED ORDER — CYCLOPENTOLATE-PHENYLEPHRINE 0.2-1 % OP SOLN
1.0000 [drp] | OPHTHALMIC | Status: AC
  Administered 2017-06-04 (×3): 1 [drp] via OPHTHALMIC

## 2017-06-04 SURGICAL SUPPLY — 12 items
CLOTH BEACON ORANGE TIMEOUT ST (SAFETY) ×1 IMPLANT
EYE SHIELD UNIVERSAL CLEAR (GAUZE/BANDAGES/DRESSINGS) ×1 IMPLANT
GLOVE BIOGEL PI IND STRL 6.5 (GLOVE) IMPLANT
GLOVE BIOGEL PI IND STRL 7.0 (GLOVE) IMPLANT
GLOVE BIOGEL PI INDICATOR 6.5 (GLOVE) ×1
GLOVE BIOGEL PI INDICATOR 7.0 (GLOVE) ×1
LENS ALC ACRYL/TECN (Ophthalmic Related) ×1 IMPLANT
PAD ARMBOARD 7.5X6 YLW CONV (MISCELLANEOUS) ×1 IMPLANT
SYRINGE LUER LOK 1CC (MISCELLANEOUS) ×1 IMPLANT
TAPE SURG TRANSPORE 1 IN (GAUZE/BANDAGES/DRESSINGS) IMPLANT
TAPE SURGICAL TRANSPORE 1 IN (GAUZE/BANDAGES/DRESSINGS) ×1
WATER STERILE IRR 250ML POUR (IV SOLUTION) ×1 IMPLANT

## 2017-06-04 NOTE — Anesthesia Postprocedure Evaluation (Signed)
Anesthesia Post Note  Patient: Megan [name]  Procedure(s) Performed: CATARACT EXTRACTION PHACO AND INTRAOCULAR LENS PLACEMENT ([place]) (Right Eye)  Patient location during evaluation: Short Stay Anesthesia Type: MAC Level of consciousness: awake and alert Pain management: pain level controlled Vital Signs Assessment: post-procedure vital signs reviewed and stable Respiratory status: spontaneous breathing Cardiovascular status: stable Postop Assessment: no apparent nausea or vomiting Anesthetic complications: no     Last Vitals:  Vitals:   06/04/17 1115 06/04/17 1140  BP: (!) 150/92 (!) 170/96  Pulse:  72  Resp: 14 16  Temp:  36.4 C  SpO2: 99% 99%    Last Pain:  Vitals:   06/04/17 1140  TempSrc: Oral                 Jordi Lacko

## 2017-06-04 NOTE — Anesthesia Procedure Notes (Signed)
Procedure Name: MAC Date/Time: 06/04/2017 11:18 AM Performed by: Vista Deck Pre-anesthesia Checklist: Patient identified, Emergency Drugs available, Suction available, Timeout performed and Patient being monitored Patient Re-evaluated:Patient Re-evaluated prior to induction Oxygen Delivery Method: Nasal Cannula

## 2017-06-04 NOTE — Discharge Instructions (Signed)
PATIENT INSTRUCTIONS °POST-ANESTHESIA ° °IMMEDIATELY FOLLOWING SURGERY:  Do not drive or operate machinery for the first twenty four hours after surgery.  Do not make any important decisions for twenty four hours after surgery or while taking narcotic pain medications or sedatives.  If you develop intractable nausea and vomiting or a severe headache please notify your doctor immediately. ° °FOLLOW-UP:  Please make an appointment with your surgeon as instructed. You do not need to follow up with anesthesia unless specifically instructed to do so. ° °WOUND CARE INSTRUCTIONS (if applicable):  Keep a dry clean dressing on the anesthesia/puncture wound site if there is drainage.  Once the wound has quit draining you may leave it open to air.  Generally you should leave the bandage intact for twenty four hours unless there is drainage.  If the epidural site drains for more than 36-48 hours please call the anesthesia department. ° °QUESTIONS?:  Please feel free to call your physician or the hospital operator if you have any questions, and they will be happy to assist you.    ° ° ° °

## 2017-06-04 NOTE — Op Note (Signed)
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  Assistants: None  Anesthesia: Topical with MAC  Indications: Painless, progressive loss of vision with compromise of daily activities.  Surgery: Cataract Extraction with Intraocular lens Implant Right Eye  Discription: The patient had dilating drops and viscous lidocaine placed into the Right eye in the pre-op holding area. After transfer to the operating room, a time out was performed. The patient was then prepped and draped. Beginning with a 61m blade a paracentesis port was made at the surgeon's 2 o'clock position. The anterior chamber was then filled with 1% non-preserved lidocaine. This was followed by filling the anterior chamber with Provisc.  A 2.448mkeratome blade was used to make a clear corneal incision at the temporal limbus.  A bent cystatome needle was used to create a continuous tear capsulotomy. Hydrodissection was performed with balanced salt solution on a Fine canula. The lens nucleus was then removed using the phacoemulsification handpiece. Residual cortex was removed with the I&A handpiece. The anterior chamber and capsular bag were refilled with Provisc. A posterior chamber intraocular lens was placed into the capsular bag with it's injector. The implant was positioned with the Kuglan hook. The Provisc was then removed from the anterior chamber and capsular bag with the I&A handpiece. Stromal hydration of the main incision and paracentesis port was performed with BSS on a Fine canula. The wounds were tested for leak which was negative. The patient tolerated the procedure well. There were no operative complications. The patient was then transferred to the recovery room in stable condition.  Complications: None  Specimen: None  EBL: None  Prosthetic device: Abbott Technis, PCB00, power  22.0, SN [id number]

## 2017-06-04 NOTE — H&P (Signed)
I have reviewed the H&P, the patient was re-examined, and I have identified no interval changes in medical condition and plan of care since the history and physical of record  

## 2017-06-04 NOTE — Transfer of Care (Signed)
Immediate Anesthesia Transfer of Care Note  Patient: Megan [name]  Procedure(s) Performed: CATARACT EXTRACTION PHACO AND INTRAOCULAR LENS PLACEMENT ([facility]) (Right Eye)  Patient Location: Shortstay  Anesthesia Type:MAC  Level of Consciousness: awake and alert   Airway & Oxygen Therapy: Patient Spontanous Breathing  Post-op Assessment: Report given to RN and Post -op Vital signs reviewed and stable  Post vital signs: Reviewed and stable  Last Vitals:  Vitals:   06/04/17 1100 06/04/17 1115  BP: (!) 145/91 (!) 150/92  Pulse:    Resp: 16 14  Temp:    SpO2: 99% 99%    Last Pain:  Vitals:   06/04/17 1013  TempSrc: (P) Oral      Patients Stated Pain Goal: (P) 7 (16/01/09 3235)  Complications: No apparent anesthesia complications

## 2017-06-04 NOTE — Anesthesia Preprocedure Evaluation (Signed)
Anesthesia Evaluation  Patient identified by MRN, date of birth, ID band Patient awake    History of Anesthesia Complications (+) PONV  Airway Mallampati: I  TM Distance: >3 FB Neck ROM: Full    Dental  (+) Teeth Intact   Pulmonary    Pulmonary exam normal        Cardiovascular Exercise Tolerance: Poor hypertension, Pt. on medications Normal cardiovascular exam Rhythm:Regular Rate:Normal   Systolic function was normal. The estimated ejection fraction was in the range of 60% to 65%. Diastolic function is abnormal, indeterminant grade. (September 2018)    Neuro/Psych    GI/Hepatic GERD  Medicated and Controlled,  Endo/Other  diabetes, Well Controlled, Type 2  Renal/GU ESRF and DialysisRenal diseaseResults for ARRIA, NAIM (MRN [id number]) as of 06/04/2017 10:09  04/03/2017 10:19 Sodium: 142 Potassium: 4.7 Chloride: 107 CO2: 19 (L) Glucose: 121 (H) Mean Plasma Glucose: 114 BUN: 62 (H)      Musculoskeletal   Abdominal Normal abdominal exam  (+)   Peds  Hematology  (+) anemia ,   Anesthesia Other Findings   Reproductive/Obstetrics                             Anesthesia Physical Anesthesia Plan  ASA: IV  Anesthesia Plan: MAC   Post-op Pain Management:    Induction:   PONV Risk Score and Plan:   Airway Management Planned: Nasal Cannula  Additional Equipment:   Intra-op Plan:   Post-operative Plan:   Informed Consent: I have reviewed the patients History and Physical, chart, labs and discussed the procedure including the risks, benefits and alternatives for the proposed anesthesia with the patient or authorized representative who has indicated his/her understanding and acceptance.   Dental advisory given  Plan Discussed with: CRNA  Anesthesia Plan Comments:         Anesthesia Quick Evaluation

## 2017-06-05 ENCOUNTER — Encounter (HOSPITAL_COMMUNITY): Payer: Self-pay | Admitting: Ophthalmology

## 2017-06-05 DIAGNOSIS — N186 End stage renal disease: Secondary | ICD-10-CM | POA: Diagnosis not present

## 2017-06-05 DIAGNOSIS — E119 Type 2 diabetes mellitus without complications: Secondary | ICD-10-CM | POA: Diagnosis not present

## 2017-06-05 DIAGNOSIS — E1129 Type 2 diabetes mellitus with other diabetic kidney complication: Secondary | ICD-10-CM | POA: Diagnosis not present

## 2017-06-05 DIAGNOSIS — E875 Hyperkalemia: Secondary | ICD-10-CM | POA: Diagnosis not present

## 2017-06-05 DIAGNOSIS — D631 Anemia in chronic kidney disease: Secondary | ICD-10-CM | POA: Diagnosis not present

## 2017-06-05 DIAGNOSIS — N2581 Secondary hyperparathyroidism of renal origin: Secondary | ICD-10-CM | POA: Diagnosis not present

## 2017-06-06 ENCOUNTER — Telehealth: Payer: Self-pay | Admitting: Family Medicine

## 2017-06-06 DIAGNOSIS — L600 Ingrowing nail: Secondary | ICD-10-CM

## 2017-06-06 DIAGNOSIS — Z48812 Encounter for surgical aftercare following surgery on the circulatory system: Secondary | ICD-10-CM | POA: Diagnosis not present

## 2017-06-06 NOTE — Telephone Encounter (Signed)
Pt called [facility] wanting to know if we could do a referral for podiatry for an ingrown toenail - ok to do referral?

## 2017-06-07 ENCOUNTER — Other Ambulatory Visit (HOSPITAL_COMMUNITY)

## 2017-06-07 DIAGNOSIS — N2581 Secondary hyperparathyroidism of renal origin: Secondary | ICD-10-CM | POA: Diagnosis not present

## 2017-06-07 DIAGNOSIS — D631 Anemia in chronic kidney disease: Secondary | ICD-10-CM | POA: Diagnosis not present

## 2017-06-07 DIAGNOSIS — E875 Hyperkalemia: Secondary | ICD-10-CM | POA: Diagnosis not present

## 2017-06-07 DIAGNOSIS — E119 Type 2 diabetes mellitus without complications: Secondary | ICD-10-CM | POA: Diagnosis not present

## 2017-06-07 DIAGNOSIS — N186 End stage renal disease: Secondary | ICD-10-CM | POA: Diagnosis not present

## 2017-06-07 DIAGNOSIS — E1129 Type 2 diabetes mellitus with other diabetic kidney complication: Secondary | ICD-10-CM | POA: Diagnosis not present

## 2017-06-07 NOTE — Telephone Encounter (Signed)
Referral placed.

## 2017-06-07 NOTE — Telephone Encounter (Signed)
ok 

## 2017-06-08 ENCOUNTER — Encounter (HOSPITAL_COMMUNITY): Payer: Medicare Other

## 2017-06-08 ENCOUNTER — Ambulatory Visit (INDEPENDENT_AMBULATORY_CARE_PROVIDER_SITE_OTHER): Payer: Medicare Other | Admitting: Podiatry

## 2017-06-08 ENCOUNTER — Inpatient Hospital Stay (HOSPITAL_COMMUNITY): Admission: RE | Admit: 2017-06-08 | Payer: TRICARE For Life (TFL) | Source: Ambulatory Visit

## 2017-06-08 DIAGNOSIS — L600 Ingrowing nail: Secondary | ICD-10-CM

## 2017-06-08 NOTE — Progress Notes (Signed)
   Subjective:    Patient ID: Megan [name], female    DOB: 01/17/1961, 56 y.o.   MRN: [id number]  HPI    Review of Systems  All other systems reviewed and are negative.      Objective:   Physical Exam        Assessment & Plan:

## 2017-06-08 NOTE — Patient Instructions (Signed)

## 2017-06-09 DIAGNOSIS — D631 Anemia in chronic kidney disease: Secondary | ICD-10-CM | POA: Diagnosis not present

## 2017-06-09 DIAGNOSIS — E875 Hyperkalemia: Secondary | ICD-10-CM | POA: Diagnosis not present

## 2017-06-09 DIAGNOSIS — N186 End stage renal disease: Secondary | ICD-10-CM | POA: Diagnosis not present

## 2017-06-09 DIAGNOSIS — E1129 Type 2 diabetes mellitus with other diabetic kidney complication: Secondary | ICD-10-CM | POA: Diagnosis not present

## 2017-06-09 DIAGNOSIS — N2581 Secondary hyperparathyroidism of renal origin: Secondary | ICD-10-CM | POA: Diagnosis not present

## 2017-06-09 DIAGNOSIS — E119 Type 2 diabetes mellitus without complications: Secondary | ICD-10-CM | POA: Diagnosis not present

## 2017-06-11 ENCOUNTER — Encounter (HOSPITAL_COMMUNITY)
Admission: RE | Admit: 2017-06-11 | Discharge: 2017-06-11 | Disposition: A | Discharge status: Home / Self Care | Payer: Medicare Other | Source: Ambulatory Visit | Attending: Cardiology | Admitting: Cardiology

## 2017-06-11 ENCOUNTER — Telehealth: Payer: Self-pay | Admitting: Podiatry

## 2017-06-11 ENCOUNTER — Ambulatory Visit (HOSPITAL_COMMUNITY)
Admission: RE | Admit: 2017-06-11 | Discharge: 2017-06-11 | Disposition: A | Discharge status: Home / Self Care | Payer: Medicare Other | Source: Ambulatory Visit | Attending: Cardiology | Admitting: Cardiology

## 2017-06-11 ENCOUNTER — Encounter (HOSPITAL_COMMUNITY): Payer: Self-pay

## 2017-06-11 DIAGNOSIS — Z01818 Encounter for other preprocedural examination: Secondary | ICD-10-CM | POA: Diagnosis not present

## 2017-06-11 HISTORY — DX: Disorder of kidney and ureter, unspecified: N28.9

## 2017-06-11 LAB — NM MYOCAR MULTI W/SPECT W/WALL MOTION / EF
LV dias vol: 35 mL (ref 46–106)
LV sys vol: 35 mL
Peak HR: 108 {beats}/min
RATE: 0.46
Rest HR: 71 {beats}/min
SDS: 0
SRS: 3
SSS: 3
TID: 1.02

## 2017-06-11 MED ORDER — REGADENOSON 0.4 MG/5ML IV SOLN
INTRAVENOUS | Status: AC
  Administered 2017-06-11: 0.4 mg via INTRAVENOUS
  Filled 2017-06-11: qty 5

## 2017-06-11 MED ORDER — SODIUM CHLORIDE 0.9% FLUSH
INTRAVENOUS | Status: AC
  Administered 2017-06-11: 10 mL via INTRAVENOUS
  Filled 2017-06-11: qty 10

## 2017-06-11 MED ORDER — TECHNETIUM TC 99M TETROFOSMIN IV KIT
10.0000 | PACK | Freq: Once | INTRAVENOUS | Status: AC | PRN
  Administered 2017-06-11: 10 via INTRAVENOUS

## 2017-06-11 MED ORDER — TECHNETIUM TC 99M TETROFOSMIN IV KIT
30.0000 | PACK | Freq: Once | INTRAVENOUS | Status: AC | PRN
  Administered 2017-06-11: 30 via INTRAVENOUS

## 2017-06-11 NOTE — Telephone Encounter (Signed)
I had an ingrown toenail removed on Friday. I do have questions on how long I need to soak it. If you could please give me a call back at [phone]. Thank you.

## 2017-06-11 NOTE — Telephone Encounter (Signed)
I informed pt she should soak the foot twice daily for 20 minutes each session, for 4-6 weeks and at the end of the 4th week she should do the last soak of the day, leave off the antibiotic ointment dressing and if the area got a dry hard scab without redness, swelling, pain or drainage she could stop the soaks. Pt states understanding.

## 2017-06-12 DIAGNOSIS — E875 Hyperkalemia: Secondary | ICD-10-CM | POA: Diagnosis not present

## 2017-06-12 DIAGNOSIS — N2581 Secondary hyperparathyroidism of renal origin: Secondary | ICD-10-CM | POA: Diagnosis not present

## 2017-06-12 DIAGNOSIS — N186 End stage renal disease: Secondary | ICD-10-CM | POA: Diagnosis not present

## 2017-06-12 DIAGNOSIS — D631 Anemia in chronic kidney disease: Secondary | ICD-10-CM | POA: Diagnosis not present

## 2017-06-12 DIAGNOSIS — E119 Type 2 diabetes mellitus without complications: Secondary | ICD-10-CM | POA: Diagnosis not present

## 2017-06-12 DIAGNOSIS — E1129 Type 2 diabetes mellitus with other diabetic kidney complication: Secondary | ICD-10-CM | POA: Diagnosis not present

## 2017-06-13 DIAGNOSIS — N186 End stage renal disease: Secondary | ICD-10-CM | POA: Diagnosis not present

## 2017-06-13 DIAGNOSIS — Z992 Dependence on renal dialysis: Secondary | ICD-10-CM | POA: Diagnosis not present

## 2017-06-13 DIAGNOSIS — N040 Nephrotic syndrome with minor glomerular abnormality: Secondary | ICD-10-CM | POA: Diagnosis not present

## 2017-06-13 NOTE — Progress Notes (Signed)
Subjective:    Patient ID: Megan [name], female   DOB: 56 y.o.   MRN: [id number]   HPI patient presents stating I got a painful left big toe that's making it hard for me to wear shoe gear and it's ingrown in the corner and I've tried to soak it I've tried to trim it without relief. Patient states she does not smoke currently and her diabetes is been under good control    Review of Systems  All other systems reviewed and are negative.       Objective:  Physical Exam  Constitutional: She appears well-developed and well-nourished.  Cardiovascular: Intact distal pulses.   Pulmonary/Chest: Effort normal.  Musculoskeletal: Normal range of motion.  Neurological: She is alert.  Skin: Skin is warm.  Nursing note and vitals reviewed.  neurovascular status intact muscle strength adequate range of motion within normal limits with patient found to have incurvated left hallux lateral border that's painful when pressed and is making shoe gear difficult. Patient states that there is redness at the end of the toe and it is increasingly tender with pressure but it has not had drainage     Assessment:    Ingrown toenail deformity left hallux lateral border with pain     Plan:    H&P condition reviewed and recommended correction. I explained procedure and risk and today I infiltrated the left hallux 60 mg like Marcaine mixture remove the border exposed matrix and applied phenol 3 applications 30 seconds followed by alcohol lavaged sterile dressing. Gave instructions on soaks and reappoint

## 2017-06-14 DIAGNOSIS — N186 End stage renal disease: Secondary | ICD-10-CM | POA: Diagnosis not present

## 2017-06-14 DIAGNOSIS — N2581 Secondary hyperparathyroidism of renal origin: Secondary | ICD-10-CM | POA: Diagnosis not present

## 2017-06-14 DIAGNOSIS — E875 Hyperkalemia: Secondary | ICD-10-CM | POA: Diagnosis not present

## 2017-06-14 DIAGNOSIS — D631 Anemia in chronic kidney disease: Secondary | ICD-10-CM | POA: Diagnosis not present

## 2017-06-16 DIAGNOSIS — D631 Anemia in chronic kidney disease: Secondary | ICD-10-CM | POA: Diagnosis not present

## 2017-06-16 DIAGNOSIS — N2581 Secondary hyperparathyroidism of renal origin: Secondary | ICD-10-CM | POA: Diagnosis not present

## 2017-06-16 DIAGNOSIS — N186 End stage renal disease: Secondary | ICD-10-CM | POA: Diagnosis not present

## 2017-06-16 DIAGNOSIS — E875 Hyperkalemia: Secondary | ICD-10-CM | POA: Diagnosis not present

## 2017-06-19 DIAGNOSIS — N186 End stage renal disease: Secondary | ICD-10-CM | POA: Diagnosis not present

## 2017-06-19 DIAGNOSIS — D631 Anemia in chronic kidney disease: Secondary | ICD-10-CM | POA: Diagnosis not present

## 2017-06-19 DIAGNOSIS — E875 Hyperkalemia: Secondary | ICD-10-CM | POA: Diagnosis not present

## 2017-06-19 DIAGNOSIS — N2581 Secondary hyperparathyroidism of renal origin: Secondary | ICD-10-CM | POA: Diagnosis not present

## 2017-06-20 ENCOUNTER — Other Ambulatory Visit: Payer: Self-pay | Admitting: Family Medicine

## 2017-06-21 ENCOUNTER — Other Ambulatory Visit: Payer: Self-pay | Admitting: Family Medicine

## 2017-06-21 DIAGNOSIS — D631 Anemia in chronic kidney disease: Secondary | ICD-10-CM | POA: Diagnosis not present

## 2017-06-21 DIAGNOSIS — N183 Chronic kidney disease, stage 3 (moderate): Secondary | ICD-10-CM | POA: Diagnosis not present

## 2017-06-21 DIAGNOSIS — E875 Hyperkalemia: Secondary | ICD-10-CM | POA: Diagnosis not present

## 2017-06-21 DIAGNOSIS — N186 End stage renal disease: Secondary | ICD-10-CM | POA: Diagnosis not present

## 2017-06-21 DIAGNOSIS — N2581 Secondary hyperparathyroidism of renal origin: Secondary | ICD-10-CM | POA: Diagnosis not present

## 2017-06-21 DIAGNOSIS — Z7682 Awaiting organ transplant status: Secondary | ICD-10-CM | POA: Diagnosis not present

## 2017-06-22 LAB — ABO AND RH

## 2017-06-22 LAB — ANTIBODY SCREEN: Antibody Screen: NOT DETECTED

## 2017-06-23 DIAGNOSIS — N186 End stage renal disease: Secondary | ICD-10-CM | POA: Diagnosis not present

## 2017-06-23 DIAGNOSIS — D631 Anemia in chronic kidney disease: Secondary | ICD-10-CM | POA: Diagnosis not present

## 2017-06-23 DIAGNOSIS — E875 Hyperkalemia: Secondary | ICD-10-CM | POA: Diagnosis not present

## 2017-06-23 DIAGNOSIS — N2581 Secondary hyperparathyroidism of renal origin: Secondary | ICD-10-CM | POA: Diagnosis not present

## 2017-06-26 DIAGNOSIS — D631 Anemia in chronic kidney disease: Secondary | ICD-10-CM | POA: Diagnosis not present

## 2017-06-26 DIAGNOSIS — N186 End stage renal disease: Secondary | ICD-10-CM | POA: Diagnosis not present

## 2017-06-26 DIAGNOSIS — N2581 Secondary hyperparathyroidism of renal origin: Secondary | ICD-10-CM | POA: Diagnosis not present

## 2017-06-26 DIAGNOSIS — E875 Hyperkalemia: Secondary | ICD-10-CM | POA: Diagnosis not present

## 2017-06-28 DIAGNOSIS — D631 Anemia in chronic kidney disease: Secondary | ICD-10-CM | POA: Diagnosis not present

## 2017-06-28 DIAGNOSIS — N186 End stage renal disease: Secondary | ICD-10-CM | POA: Diagnosis not present

## 2017-06-28 DIAGNOSIS — E875 Hyperkalemia: Secondary | ICD-10-CM | POA: Diagnosis not present

## 2017-06-28 DIAGNOSIS — N2581 Secondary hyperparathyroidism of renal origin: Secondary | ICD-10-CM | POA: Diagnosis not present

## 2017-06-29 DIAGNOSIS — N186 End stage renal disease: Secondary | ICD-10-CM | POA: Diagnosis not present

## 2017-06-29 DIAGNOSIS — Z6832 Body mass index (BMI) 32.0-32.9, adult: Secondary | ICD-10-CM | POA: Diagnosis not present

## 2017-06-30 DIAGNOSIS — N186 End stage renal disease: Secondary | ICD-10-CM | POA: Diagnosis not present

## 2017-06-30 DIAGNOSIS — D631 Anemia in chronic kidney disease: Secondary | ICD-10-CM | POA: Diagnosis not present

## 2017-06-30 DIAGNOSIS — N2581 Secondary hyperparathyroidism of renal origin: Secondary | ICD-10-CM | POA: Diagnosis not present

## 2017-06-30 DIAGNOSIS — E875 Hyperkalemia: Secondary | ICD-10-CM | POA: Diagnosis not present

## 2017-07-02 DIAGNOSIS — N186 End stage renal disease: Secondary | ICD-10-CM | POA: Diagnosis not present

## 2017-07-02 DIAGNOSIS — D631 Anemia in chronic kidney disease: Secondary | ICD-10-CM | POA: Diagnosis not present

## 2017-07-02 DIAGNOSIS — N2581 Secondary hyperparathyroidism of renal origin: Secondary | ICD-10-CM | POA: Diagnosis not present

## 2017-07-02 DIAGNOSIS — E875 Hyperkalemia: Secondary | ICD-10-CM | POA: Diagnosis not present

## 2017-07-04 DIAGNOSIS — N2581 Secondary hyperparathyroidism of renal origin: Secondary | ICD-10-CM | POA: Diagnosis not present

## 2017-07-04 DIAGNOSIS — N186 End stage renal disease: Secondary | ICD-10-CM | POA: Diagnosis not present

## 2017-07-04 DIAGNOSIS — E875 Hyperkalemia: Secondary | ICD-10-CM | POA: Diagnosis not present

## 2017-07-04 DIAGNOSIS — D631 Anemia in chronic kidney disease: Secondary | ICD-10-CM | POA: Diagnosis not present

## 2017-07-07 DIAGNOSIS — N2581 Secondary hyperparathyroidism of renal origin: Secondary | ICD-10-CM | POA: Diagnosis not present

## 2017-07-07 DIAGNOSIS — E875 Hyperkalemia: Secondary | ICD-10-CM | POA: Diagnosis not present

## 2017-07-07 DIAGNOSIS — N186 End stage renal disease: Secondary | ICD-10-CM | POA: Diagnosis not present

## 2017-07-07 DIAGNOSIS — D631 Anemia in chronic kidney disease: Secondary | ICD-10-CM | POA: Diagnosis not present

## 2017-07-10 DIAGNOSIS — N186 End stage renal disease: Secondary | ICD-10-CM | POA: Diagnosis not present

## 2017-07-10 DIAGNOSIS — N2581 Secondary hyperparathyroidism of renal origin: Secondary | ICD-10-CM | POA: Diagnosis not present

## 2017-07-10 DIAGNOSIS — E875 Hyperkalemia: Secondary | ICD-10-CM | POA: Diagnosis not present

## 2017-07-10 DIAGNOSIS — D631 Anemia in chronic kidney disease: Secondary | ICD-10-CM | POA: Diagnosis not present

## 2017-07-12 DIAGNOSIS — N2581 Secondary hyperparathyroidism of renal origin: Secondary | ICD-10-CM | POA: Diagnosis not present

## 2017-07-12 DIAGNOSIS — E875 Hyperkalemia: Secondary | ICD-10-CM | POA: Diagnosis not present

## 2017-07-12 DIAGNOSIS — N186 End stage renal disease: Secondary | ICD-10-CM | POA: Diagnosis not present

## 2017-07-12 DIAGNOSIS — D631 Anemia in chronic kidney disease: Secondary | ICD-10-CM | POA: Diagnosis not present

## 2017-07-13 DIAGNOSIS — N186 End stage renal disease: Secondary | ICD-10-CM | POA: Diagnosis not present

## 2017-07-13 DIAGNOSIS — Z992 Dependence on renal dialysis: Secondary | ICD-10-CM | POA: Diagnosis not present

## 2017-07-13 DIAGNOSIS — N040 Nephrotic syndrome with minor glomerular abnormality: Secondary | ICD-10-CM | POA: Diagnosis not present

## 2017-07-14 DIAGNOSIS — E875 Hyperkalemia: Secondary | ICD-10-CM | POA: Diagnosis not present

## 2017-07-14 DIAGNOSIS — N2581 Secondary hyperparathyroidism of renal origin: Secondary | ICD-10-CM | POA: Diagnosis not present

## 2017-07-14 DIAGNOSIS — N186 End stage renal disease: Secondary | ICD-10-CM | POA: Diagnosis not present

## 2017-07-14 DIAGNOSIS — D631 Anemia in chronic kidney disease: Secondary | ICD-10-CM | POA: Diagnosis not present

## 2017-07-15 ENCOUNTER — Other Ambulatory Visit: Payer: Self-pay | Admitting: Family Medicine

## 2017-07-17 DIAGNOSIS — N186 End stage renal disease: Secondary | ICD-10-CM | POA: Diagnosis not present

## 2017-07-17 DIAGNOSIS — D631 Anemia in chronic kidney disease: Secondary | ICD-10-CM | POA: Diagnosis not present

## 2017-07-17 DIAGNOSIS — N2581 Secondary hyperparathyroidism of renal origin: Secondary | ICD-10-CM | POA: Diagnosis not present

## 2017-07-17 DIAGNOSIS — E875 Hyperkalemia: Secondary | ICD-10-CM | POA: Diagnosis not present

## 2017-07-19 DIAGNOSIS — D631 Anemia in chronic kidney disease: Secondary | ICD-10-CM | POA: Diagnosis not present

## 2017-07-19 DIAGNOSIS — N2581 Secondary hyperparathyroidism of renal origin: Secondary | ICD-10-CM | POA: Diagnosis not present

## 2017-07-19 DIAGNOSIS — E875 Hyperkalemia: Secondary | ICD-10-CM | POA: Diagnosis not present

## 2017-07-19 DIAGNOSIS — N186 End stage renal disease: Secondary | ICD-10-CM | POA: Diagnosis not present

## 2017-07-20 ENCOUNTER — Ambulatory Visit: Payer: Medicare Other | Admitting: Family Medicine

## 2017-07-21 DIAGNOSIS — N2581 Secondary hyperparathyroidism of renal origin: Secondary | ICD-10-CM | POA: Diagnosis not present

## 2017-07-21 DIAGNOSIS — D631 Anemia in chronic kidney disease: Secondary | ICD-10-CM | POA: Diagnosis not present

## 2017-07-21 DIAGNOSIS — N186 End stage renal disease: Secondary | ICD-10-CM | POA: Diagnosis not present

## 2017-07-21 DIAGNOSIS — E875 Hyperkalemia: Secondary | ICD-10-CM | POA: Diagnosis not present

## 2017-07-24 DIAGNOSIS — N2581 Secondary hyperparathyroidism of renal origin: Secondary | ICD-10-CM | POA: Diagnosis not present

## 2017-07-24 DIAGNOSIS — N186 End stage renal disease: Secondary | ICD-10-CM | POA: Diagnosis not present

## 2017-07-24 DIAGNOSIS — D631 Anemia in chronic kidney disease: Secondary | ICD-10-CM | POA: Diagnosis not present

## 2017-07-24 DIAGNOSIS — E875 Hyperkalemia: Secondary | ICD-10-CM | POA: Diagnosis not present

## 2017-07-25 DIAGNOSIS — Z7682 Awaiting organ transplant status: Secondary | ICD-10-CM | POA: Diagnosis not present

## 2017-07-26 DIAGNOSIS — D631 Anemia in chronic kidney disease: Secondary | ICD-10-CM | POA: Diagnosis not present

## 2017-07-26 DIAGNOSIS — N186 End stage renal disease: Secondary | ICD-10-CM | POA: Diagnosis not present

## 2017-07-26 DIAGNOSIS — N2581 Secondary hyperparathyroidism of renal origin: Secondary | ICD-10-CM | POA: Diagnosis not present

## 2017-07-26 DIAGNOSIS — E875 Hyperkalemia: Secondary | ICD-10-CM | POA: Diagnosis not present

## 2017-07-28 DIAGNOSIS — D631 Anemia in chronic kidney disease: Secondary | ICD-10-CM | POA: Diagnosis not present

## 2017-07-28 DIAGNOSIS — N2581 Secondary hyperparathyroidism of renal origin: Secondary | ICD-10-CM | POA: Diagnosis not present

## 2017-07-28 DIAGNOSIS — E875 Hyperkalemia: Secondary | ICD-10-CM | POA: Diagnosis not present

## 2017-07-28 DIAGNOSIS — N186 End stage renal disease: Secondary | ICD-10-CM | POA: Diagnosis not present

## 2017-07-31 DIAGNOSIS — E875 Hyperkalemia: Secondary | ICD-10-CM | POA: Diagnosis not present

## 2017-07-31 DIAGNOSIS — N186 End stage renal disease: Secondary | ICD-10-CM | POA: Diagnosis not present

## 2017-07-31 DIAGNOSIS — D631 Anemia in chronic kidney disease: Secondary | ICD-10-CM | POA: Diagnosis not present

## 2017-07-31 DIAGNOSIS — N2581 Secondary hyperparathyroidism of renal origin: Secondary | ICD-10-CM | POA: Diagnosis not present

## 2017-08-02 DIAGNOSIS — D631 Anemia in chronic kidney disease: Secondary | ICD-10-CM | POA: Diagnosis not present

## 2017-08-02 DIAGNOSIS — N2581 Secondary hyperparathyroidism of renal origin: Secondary | ICD-10-CM | POA: Diagnosis not present

## 2017-08-02 DIAGNOSIS — N186 End stage renal disease: Secondary | ICD-10-CM | POA: Diagnosis not present

## 2017-08-02 DIAGNOSIS — E875 Hyperkalemia: Secondary | ICD-10-CM | POA: Diagnosis not present

## 2017-08-04 DIAGNOSIS — E875 Hyperkalemia: Secondary | ICD-10-CM | POA: Diagnosis not present

## 2017-08-04 DIAGNOSIS — N186 End stage renal disease: Secondary | ICD-10-CM | POA: Diagnosis not present

## 2017-08-04 DIAGNOSIS — D631 Anemia in chronic kidney disease: Secondary | ICD-10-CM | POA: Diagnosis not present

## 2017-08-04 DIAGNOSIS — N2581 Secondary hyperparathyroidism of renal origin: Secondary | ICD-10-CM | POA: Diagnosis not present

## 2017-08-06 DIAGNOSIS — N2581 Secondary hyperparathyroidism of renal origin: Secondary | ICD-10-CM | POA: Diagnosis not present

## 2017-08-06 DIAGNOSIS — E875 Hyperkalemia: Secondary | ICD-10-CM | POA: Diagnosis not present

## 2017-08-06 DIAGNOSIS — N186 End stage renal disease: Secondary | ICD-10-CM | POA: Diagnosis not present

## 2017-08-06 DIAGNOSIS — D631 Anemia in chronic kidney disease: Secondary | ICD-10-CM | POA: Diagnosis not present

## 2017-08-08 DIAGNOSIS — N2581 Secondary hyperparathyroidism of renal origin: Secondary | ICD-10-CM | POA: Diagnosis not present

## 2017-08-08 DIAGNOSIS — N186 End stage renal disease: Secondary | ICD-10-CM | POA: Diagnosis not present

## 2017-08-08 DIAGNOSIS — E875 Hyperkalemia: Secondary | ICD-10-CM | POA: Diagnosis not present

## 2017-08-08 DIAGNOSIS — D631 Anemia in chronic kidney disease: Secondary | ICD-10-CM | POA: Diagnosis not present

## 2017-08-11 DIAGNOSIS — D631 Anemia in chronic kidney disease: Secondary | ICD-10-CM | POA: Diagnosis not present

## 2017-08-11 DIAGNOSIS — N2581 Secondary hyperparathyroidism of renal origin: Secondary | ICD-10-CM | POA: Diagnosis not present

## 2017-08-11 DIAGNOSIS — N186 End stage renal disease: Secondary | ICD-10-CM | POA: Diagnosis not present

## 2017-08-11 DIAGNOSIS — E875 Hyperkalemia: Secondary | ICD-10-CM | POA: Diagnosis not present

## 2017-08-13 DIAGNOSIS — N2581 Secondary hyperparathyroidism of renal origin: Secondary | ICD-10-CM | POA: Diagnosis not present

## 2017-08-13 DIAGNOSIS — Z992 Dependence on renal dialysis: Secondary | ICD-10-CM | POA: Diagnosis not present

## 2017-08-13 DIAGNOSIS — E875 Hyperkalemia: Secondary | ICD-10-CM | POA: Diagnosis not present

## 2017-08-13 DIAGNOSIS — N040 Nephrotic syndrome with minor glomerular abnormality: Secondary | ICD-10-CM | POA: Diagnosis not present

## 2017-08-13 DIAGNOSIS — N186 End stage renal disease: Secondary | ICD-10-CM | POA: Diagnosis not present

## 2017-08-13 DIAGNOSIS — D631 Anemia in chronic kidney disease: Secondary | ICD-10-CM | POA: Diagnosis not present

## 2017-08-16 DIAGNOSIS — N2581 Secondary hyperparathyroidism of renal origin: Secondary | ICD-10-CM | POA: Diagnosis not present

## 2017-08-16 DIAGNOSIS — E875 Hyperkalemia: Secondary | ICD-10-CM | POA: Diagnosis not present

## 2017-08-16 DIAGNOSIS — D631 Anemia in chronic kidney disease: Secondary | ICD-10-CM | POA: Diagnosis not present

## 2017-08-16 DIAGNOSIS — N186 End stage renal disease: Secondary | ICD-10-CM | POA: Diagnosis not present

## 2017-08-18 DIAGNOSIS — N186 End stage renal disease: Secondary | ICD-10-CM | POA: Diagnosis not present

## 2017-08-18 DIAGNOSIS — E875 Hyperkalemia: Secondary | ICD-10-CM | POA: Diagnosis not present

## 2017-08-18 DIAGNOSIS — N2581 Secondary hyperparathyroidism of renal origin: Secondary | ICD-10-CM | POA: Diagnosis not present

## 2017-08-18 DIAGNOSIS — D631 Anemia in chronic kidney disease: Secondary | ICD-10-CM | POA: Diagnosis not present

## 2017-08-20 DIAGNOSIS — N2581 Secondary hyperparathyroidism of renal origin: Secondary | ICD-10-CM | POA: Diagnosis not present

## 2017-08-20 DIAGNOSIS — D631 Anemia in chronic kidney disease: Secondary | ICD-10-CM | POA: Diagnosis not present

## 2017-08-20 DIAGNOSIS — E875 Hyperkalemia: Secondary | ICD-10-CM | POA: Diagnosis not present

## 2017-08-20 DIAGNOSIS — N186 End stage renal disease: Secondary | ICD-10-CM | POA: Diagnosis not present

## 2017-08-23 DIAGNOSIS — D631 Anemia in chronic kidney disease: Secondary | ICD-10-CM | POA: Diagnosis not present

## 2017-08-23 DIAGNOSIS — E875 Hyperkalemia: Secondary | ICD-10-CM | POA: Diagnosis not present

## 2017-08-23 DIAGNOSIS — N186 End stage renal disease: Secondary | ICD-10-CM | POA: Diagnosis not present

## 2017-08-23 DIAGNOSIS — N2581 Secondary hyperparathyroidism of renal origin: Secondary | ICD-10-CM | POA: Diagnosis not present

## 2017-08-25 DIAGNOSIS — E875 Hyperkalemia: Secondary | ICD-10-CM | POA: Diagnosis not present

## 2017-08-25 DIAGNOSIS — N2581 Secondary hyperparathyroidism of renal origin: Secondary | ICD-10-CM | POA: Diagnosis not present

## 2017-08-25 DIAGNOSIS — N186 End stage renal disease: Secondary | ICD-10-CM | POA: Diagnosis not present

## 2017-08-25 DIAGNOSIS — D631 Anemia in chronic kidney disease: Secondary | ICD-10-CM | POA: Diagnosis not present

## 2017-08-27 DIAGNOSIS — Z992 Dependence on renal dialysis: Secondary | ICD-10-CM | POA: Diagnosis not present

## 2017-08-27 DIAGNOSIS — T82858A Stenosis of vascular prosthetic devices, implants and grafts, initial encounter: Secondary | ICD-10-CM | POA: Diagnosis not present

## 2017-08-27 DIAGNOSIS — I771 Stricture of artery: Secondary | ICD-10-CM | POA: Diagnosis not present

## 2017-08-27 DIAGNOSIS — N186 End stage renal disease: Secondary | ICD-10-CM | POA: Diagnosis not present

## 2017-08-28 ENCOUNTER — Ambulatory Visit (INDEPENDENT_AMBULATORY_CARE_PROVIDER_SITE_OTHER): Payer: Medicare Other | Admitting: Family Medicine

## 2017-08-28 ENCOUNTER — Encounter: Payer: Self-pay | Admitting: Family Medicine

## 2017-08-28 VITALS — BP 118/72 | HR 95 | Temp 98.1°F | Ht 62.0 in | Wt 166.0 lb

## 2017-08-28 DIAGNOSIS — H9202 Otalgia, left ear: Secondary | ICD-10-CM | POA: Diagnosis not present

## 2017-08-28 DIAGNOSIS — N186 End stage renal disease: Secondary | ICD-10-CM | POA: Diagnosis not present

## 2017-08-28 DIAGNOSIS — E875 Hyperkalemia: Secondary | ICD-10-CM | POA: Diagnosis not present

## 2017-08-28 DIAGNOSIS — D631 Anemia in chronic kidney disease: Secondary | ICD-10-CM | POA: Diagnosis not present

## 2017-08-28 DIAGNOSIS — N2581 Secondary hyperparathyroidism of renal origin: Secondary | ICD-10-CM | POA: Diagnosis not present

## 2017-08-28 MED ORDER — AMOXICILLIN 875 MG PO TABS: 875.0000 mg | ORAL_TABLET | Freq: Two times a day (BID) | ORAL | 0 refills | Status: DC

## 2017-08-28 NOTE — Progress Notes (Signed)
Subjective:    Patient ID: Megan [name], female    DOB: 1961-03-06, 57 y.o.   MRN: [id number]  HPI  Patient presents with a 3-day history of left-sided otalgia.  The pain is severe.  It is sharp.  There is no exacerbating or alleviating factors.  She denies any decreased hearing.  She denies any tinnitus in the ear.  She denies any vertigo.  Symptoms began after a recent viral upper respiratory illness that lasted 2 weeks.  She states the last time she experienced symptoms like this, she had a sinus infection.  However on exam today, the left auditory canal is completely clear.  There is no pain with palpation or traction on the helix.  There is no swelling in the auditory canal.  There is no erythema or exudate.  Tympanic membrane is pearly gray with no middle ear effusion and no erythema.  She denies any pain with eating or chewing.  She denies any numbness or tingling in her face.  There is no herpetic rash around the ear Past Medical History:  Diagnosis Date  . Anemia   . CKD (chronic kidney disease) stage 3, GFR 30-59 ml/min (HCC)   . Essential hypertension, benign   . GERD (gastroesophageal reflux disease)   . Hemorrhoids   . Mixed hyperlipidemia   . PONV (postoperative nausea and vomiting)   . Renal insufficiency   . S/P colonoscopy Jan 2011   Dr. Benson Norway: sessile polyp (benign lymphoid), large hemorrhoids, repeat 5-10 years  . Type 2 diabetes mellitus ([place])    Past Surgical History:  Procedure Laterality Date  . ABDOMINAL HYSTERECTOMY    . APPENDECTOMY    . CATARACT EXTRACTION W/PHACO Left 02/09/2017   Procedure: CATARACT EXTRACTION PHACO AND INTRAOCULAR LENS PLACEMENT LEFT EYE;  Surgeon: Tonny Branch, MD;  Location: [facility];  Service: Ophthalmology;  Laterality: Left;  CDE: 4.89  . CATARACT EXTRACTION W/PHACO Right 06/04/2017   Procedure: CATARACT EXTRACTION PHACO AND INTRAOCULAR LENS PLACEMENT ([facility]);  Surgeon: Tonny Branch, MD;  Location: [facility];  Service: Ophthalmology;   Laterality: Right;  CDE: 4.12  . CHOLECYSTECTOMY  09/29/2011   Procedure: LAPAROSCOPIC CHOLECYSTECTOMY;  Surgeon: Jamesetta So, MD;  Location: [facility];  Service: General;  Laterality: N/A;  . COLONOSCOPY  Jan 2011   Dr. Benson Norway: sessile polyp (benign lymphoid), large hemorrhoids, repeat 5-10 years  . COLONOSCOPY N/A 06/12/2016   Procedure: COLONOSCOPY;  Surgeon: Daneil Dolin, MD;  Location: [facility];  Service: Endoscopy;  Laterality: N/A;  1230   . ESOPHAGOGASTRODUODENOSCOPY  09/05/2011   ULA:GTXMI hiatal hernia; remainder of exam normal. No explanation for patient's abdominal pain with today's examination  . ESOPHAGOGASTRODUODENOSCOPY N/A 12/17/2013   Dr. [name]: gastric erythema, erosion, mild chronic inflammation on path   . LAPAROSCOPIC APPENDECTOMY  09/29/2011   Procedure: APPENDECTOMY LAPAROSCOPIC;  Surgeon: Jamesetta So, MD;  Location: [facility];  Service: General;;  incidental appendectomy   Current Outpatient Medications on File Prior to Visit  Medication Sig Dispense Refill  . amLODipine (NORVASC) 5 MG tablet TAKE 1 TABLET DAILY (Patient taking differently: Take 5 mg by mouth at bedtime) 90 tablet 1  . aspirin EC 81 MG tablet Take 81 mg by mouth daily.      Marland Kitchen atorvastatin (LIPITOR) 20 MG tablet TAKE 1 TABLET DAILY 90 tablet 1  . BYSTOLIC 10 MG tablet TAKE 1 TABLET TWICE A DAY (Patient taking differently: Take 10 mg by mouth twice daily) 180 tablet 3  .  calcium acetate (PHOSLO) 667 MG capsule Take 667 mg by mouth daily. 2 tabs with meals and 1 with snack    . cloNIDine (CATAPRES) 0.2 MG tablet Take 1 tablet (0.2 mg total) by mouth 2 (two) times daily. (Patient taking differently: Take 0.2 mg by mouth See admin instructions. Take 0.2 mg by mouth twice daily on Tuesday, Thursday and Saturday. Take 0.2 mg by mouth 3 times daily on all other days) 180 tablet 3  . hydrALAZINE (APRESOLINE) 25 MG tablet TAKE 1 TABLET THREE TIMES A DAY (Patient taking differently: Take 25 mg by mouth twice  daily on Tuesday, Thursday and Saturday. Take 25 mg by mouth 3 times daily on all other days) 270 tablet 3  . losartan (COZAAR) 100 MG tablet TAKE 1 TABLET DAILY (Patient taking differently: Take 100 mg by mouth at bedtime) 90 tablet 3  . VELPHORO 500 MG chewable tablet Chew 500 mg by mouth daily.     No current facility-administered medications on file prior to visit.    No Known Allergies Social History   Socioeconomic History  . Marital status: Married    Spouse name: Not on file  . Number of children: Not on file  . Years of education: Not on file  . Highest education level: Not on file  Social Needs  . Financial resource strain: Not on file  . Food insecurity - worry: Not on file  . Food insecurity - inability: Not on file  . Transportation needs - medical: Not on file  . Transportation needs - non-medical: Not on file  Occupational History  . 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[id number]  Tobacco Use  . Smoking status: Never Smoker  . Smokeless tobacco: Never Used  Substance and Sexual Activity  . Alcohol use: No  . Drug use: No  . Sexual activity: Yes    Birth control/protection: Surgical  Other Topics Concern  . Not on file  Social History Narrative   Works at [employer] in [place].    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   Also has to get items from high shelves-causes achy pain in shoulder area      Married.   Children are grown, out of house.     Review of Systems  All other systems reviewed and are negative.      Objective:   Physical Exam  Constitutional: She appears well-developed and well-nourished. No distress.  HENT:  Right Ear: Tympanic membrane, external ear and ear canal normal.  Left Ear: Tympanic membrane, external ear and ear canal normal.  Nose: Nose normal.  Mouth/Throat: Oropharynx is clear and moist. No oropharyngeal exudate.  Eyes: Conjunctivae are normal.  Neck: Neck supple.  Cardiovascular: Normal rate,  regular rhythm and normal heart sounds.  Pulmonary/Chest: Effort normal and breath sounds normal. No respiratory distress. She has no wheezes. She has no rales.  Lymphadenopathy:    She has no cervical adenopathy.  Skin: She is not diaphoretic.  Vitals reviewed.         Assessment & Plan:  Acute otalgia, left  Patient's exam is completely benign.  Pain could be neuropathic.  It is possible she can may have a sinus infection after her recent head cold.  We will try treating the patient for possible sinusitis with amoxicillin 875 mg p.o. twice daily for 10 days.  If symptoms are not improving, no further workup is necessary.  If symptoms persist, consider neuropathic  causes of ear pain such as trigeminal neuralgia or cervical radiculopathy.  I explained that the patient's exam is normal and that I was uncertain of the cause of her ear pain.  I do not believe it is TMJ.  Patient is comfortable with our plan as she previously had ear pain like this with a sinus infection that responded to antibiotics

## 2017-08-30 ENCOUNTER — Ambulatory Visit: Payer: Medicare Other | Admitting: Family Medicine

## 2017-08-30 DIAGNOSIS — N186 End stage renal disease: Secondary | ICD-10-CM | POA: Diagnosis not present

## 2017-08-30 DIAGNOSIS — N2581 Secondary hyperparathyroidism of renal origin: Secondary | ICD-10-CM | POA: Diagnosis not present

## 2017-08-30 DIAGNOSIS — E875 Hyperkalemia: Secondary | ICD-10-CM | POA: Diagnosis not present

## 2017-08-30 DIAGNOSIS — E119 Type 2 diabetes mellitus without complications: Secondary | ICD-10-CM | POA: Diagnosis not present

## 2017-08-30 DIAGNOSIS — E039 Hypothyroidism, unspecified: Secondary | ICD-10-CM | POA: Diagnosis not present

## 2017-08-30 DIAGNOSIS — E1129 Type 2 diabetes mellitus with other diabetic kidney complication: Secondary | ICD-10-CM | POA: Diagnosis not present

## 2017-08-30 DIAGNOSIS — D631 Anemia in chronic kidney disease: Secondary | ICD-10-CM | POA: Diagnosis not present

## 2017-09-01 DIAGNOSIS — D631 Anemia in chronic kidney disease: Secondary | ICD-10-CM | POA: Diagnosis not present

## 2017-09-01 DIAGNOSIS — N186 End stage renal disease: Secondary | ICD-10-CM | POA: Diagnosis not present

## 2017-09-01 DIAGNOSIS — N2581 Secondary hyperparathyroidism of renal origin: Secondary | ICD-10-CM | POA: Diagnosis not present

## 2017-09-01 DIAGNOSIS — E875 Hyperkalemia: Secondary | ICD-10-CM | POA: Diagnosis not present

## 2017-09-04 DIAGNOSIS — N2581 Secondary hyperparathyroidism of renal origin: Secondary | ICD-10-CM | POA: Diagnosis not present

## 2017-09-04 DIAGNOSIS — D631 Anemia in chronic kidney disease: Secondary | ICD-10-CM | POA: Diagnosis not present

## 2017-09-04 DIAGNOSIS — N186 End stage renal disease: Secondary | ICD-10-CM | POA: Diagnosis not present

## 2017-09-04 DIAGNOSIS — E875 Hyperkalemia: Secondary | ICD-10-CM | POA: Diagnosis not present

## 2017-09-06 DIAGNOSIS — D631 Anemia in chronic kidney disease: Secondary | ICD-10-CM | POA: Diagnosis not present

## 2017-09-06 DIAGNOSIS — N2581 Secondary hyperparathyroidism of renal origin: Secondary | ICD-10-CM | POA: Diagnosis not present

## 2017-09-06 DIAGNOSIS — E875 Hyperkalemia: Secondary | ICD-10-CM | POA: Diagnosis not present

## 2017-09-06 DIAGNOSIS — N186 End stage renal disease: Secondary | ICD-10-CM | POA: Diagnosis not present

## 2017-09-08 DIAGNOSIS — N186 End stage renal disease: Secondary | ICD-10-CM | POA: Diagnosis not present

## 2017-09-08 DIAGNOSIS — N2581 Secondary hyperparathyroidism of renal origin: Secondary | ICD-10-CM | POA: Diagnosis not present

## 2017-09-08 DIAGNOSIS — D631 Anemia in chronic kidney disease: Secondary | ICD-10-CM | POA: Diagnosis not present

## 2017-09-08 DIAGNOSIS — E875 Hyperkalemia: Secondary | ICD-10-CM | POA: Diagnosis not present

## 2017-09-11 DIAGNOSIS — N2581 Secondary hyperparathyroidism of renal origin: Secondary | ICD-10-CM | POA: Diagnosis not present

## 2017-09-11 DIAGNOSIS — N186 End stage renal disease: Secondary | ICD-10-CM | POA: Diagnosis not present

## 2017-09-11 DIAGNOSIS — D631 Anemia in chronic kidney disease: Secondary | ICD-10-CM | POA: Diagnosis not present

## 2017-09-11 DIAGNOSIS — E875 Hyperkalemia: Secondary | ICD-10-CM | POA: Diagnosis not present

## 2017-09-13 DIAGNOSIS — D631 Anemia in chronic kidney disease: Secondary | ICD-10-CM | POA: Diagnosis not present

## 2017-09-13 DIAGNOSIS — E875 Hyperkalemia: Secondary | ICD-10-CM | POA: Diagnosis not present

## 2017-09-13 DIAGNOSIS — N2581 Secondary hyperparathyroidism of renal origin: Secondary | ICD-10-CM | POA: Diagnosis not present

## 2017-09-13 DIAGNOSIS — Z992 Dependence on renal dialysis: Secondary | ICD-10-CM | POA: Diagnosis not present

## 2017-09-13 DIAGNOSIS — N186 End stage renal disease: Secondary | ICD-10-CM | POA: Diagnosis not present

## 2017-09-13 DIAGNOSIS — N040 Nephrotic syndrome with minor glomerular abnormality: Secondary | ICD-10-CM | POA: Diagnosis not present

## 2017-09-14 DIAGNOSIS — N040 Nephrotic syndrome with minor glomerular abnormality: Secondary | ICD-10-CM | POA: Diagnosis not present

## 2017-09-14 DIAGNOSIS — N186 End stage renal disease: Secondary | ICD-10-CM | POA: Diagnosis not present

## 2017-09-14 DIAGNOSIS — Z992 Dependence on renal dialysis: Secondary | ICD-10-CM | POA: Diagnosis not present

## 2017-09-15 DIAGNOSIS — D631 Anemia in chronic kidney disease: Secondary | ICD-10-CM | POA: Diagnosis not present

## 2017-09-15 DIAGNOSIS — E875 Hyperkalemia: Secondary | ICD-10-CM | POA: Diagnosis not present

## 2017-09-15 DIAGNOSIS — N2581 Secondary hyperparathyroidism of renal origin: Secondary | ICD-10-CM | POA: Diagnosis not present

## 2017-09-15 DIAGNOSIS — N186 End stage renal disease: Secondary | ICD-10-CM | POA: Diagnosis not present

## 2017-09-15 DIAGNOSIS — Z23 Encounter for immunization: Secondary | ICD-10-CM | POA: Diagnosis not present

## 2017-09-18 DIAGNOSIS — D631 Anemia in chronic kidney disease: Secondary | ICD-10-CM | POA: Diagnosis not present

## 2017-09-18 DIAGNOSIS — N2581 Secondary hyperparathyroidism of renal origin: Secondary | ICD-10-CM | POA: Diagnosis not present

## 2017-09-18 DIAGNOSIS — N186 End stage renal disease: Secondary | ICD-10-CM | POA: Diagnosis not present

## 2017-09-18 DIAGNOSIS — E875 Hyperkalemia: Secondary | ICD-10-CM | POA: Diagnosis not present

## 2017-09-18 DIAGNOSIS — Z23 Encounter for immunization: Secondary | ICD-10-CM | POA: Diagnosis not present

## 2017-09-20 DIAGNOSIS — E875 Hyperkalemia: Secondary | ICD-10-CM | POA: Diagnosis not present

## 2017-09-20 DIAGNOSIS — N186 End stage renal disease: Secondary | ICD-10-CM | POA: Diagnosis not present

## 2017-09-20 DIAGNOSIS — N2581 Secondary hyperparathyroidism of renal origin: Secondary | ICD-10-CM | POA: Diagnosis not present

## 2017-09-20 DIAGNOSIS — D631 Anemia in chronic kidney disease: Secondary | ICD-10-CM | POA: Diagnosis not present

## 2017-09-20 DIAGNOSIS — Z23 Encounter for immunization: Secondary | ICD-10-CM | POA: Diagnosis not present

## 2017-09-21 DIAGNOSIS — E039 Hypothyroidism, unspecified: Secondary | ICD-10-CM | POA: Insufficient documentation

## 2017-09-22 DIAGNOSIS — N186 End stage renal disease: Secondary | ICD-10-CM | POA: Diagnosis not present

## 2017-09-22 DIAGNOSIS — Z23 Encounter for immunization: Secondary | ICD-10-CM | POA: Diagnosis not present

## 2017-09-22 DIAGNOSIS — D631 Anemia in chronic kidney disease: Secondary | ICD-10-CM | POA: Diagnosis not present

## 2017-09-22 DIAGNOSIS — N2581 Secondary hyperparathyroidism of renal origin: Secondary | ICD-10-CM | POA: Diagnosis not present

## 2017-09-22 DIAGNOSIS — E875 Hyperkalemia: Secondary | ICD-10-CM | POA: Diagnosis not present

## 2017-09-24 DIAGNOSIS — Z23 Encounter for immunization: Secondary | ICD-10-CM | POA: Diagnosis not present

## 2017-09-24 DIAGNOSIS — N186 End stage renal disease: Secondary | ICD-10-CM | POA: Diagnosis not present

## 2017-09-24 DIAGNOSIS — N2581 Secondary hyperparathyroidism of renal origin: Secondary | ICD-10-CM | POA: Diagnosis not present

## 2017-09-24 DIAGNOSIS — D631 Anemia in chronic kidney disease: Secondary | ICD-10-CM | POA: Diagnosis not present

## 2017-09-24 DIAGNOSIS — E875 Hyperkalemia: Secondary | ICD-10-CM | POA: Diagnosis not present

## 2017-09-25 DIAGNOSIS — E875 Hyperkalemia: Secondary | ICD-10-CM | POA: Diagnosis not present

## 2017-09-25 DIAGNOSIS — N186 End stage renal disease: Secondary | ICD-10-CM | POA: Diagnosis not present

## 2017-09-25 DIAGNOSIS — Z23 Encounter for immunization: Secondary | ICD-10-CM | POA: Diagnosis not present

## 2017-09-25 DIAGNOSIS — D631 Anemia in chronic kidney disease: Secondary | ICD-10-CM | POA: Diagnosis not present

## 2017-09-25 DIAGNOSIS — N2581 Secondary hyperparathyroidism of renal origin: Secondary | ICD-10-CM | POA: Diagnosis not present

## 2017-09-29 DIAGNOSIS — E875 Hyperkalemia: Secondary | ICD-10-CM | POA: Diagnosis not present

## 2017-09-29 DIAGNOSIS — N2581 Secondary hyperparathyroidism of renal origin: Secondary | ICD-10-CM | POA: Diagnosis not present

## 2017-09-29 DIAGNOSIS — N186 End stage renal disease: Secondary | ICD-10-CM | POA: Diagnosis not present

## 2017-09-29 DIAGNOSIS — Z23 Encounter for immunization: Secondary | ICD-10-CM | POA: Diagnosis not present

## 2017-09-29 DIAGNOSIS — D631 Anemia in chronic kidney disease: Secondary | ICD-10-CM | POA: Diagnosis not present

## 2017-10-02 DIAGNOSIS — N186 End stage renal disease: Secondary | ICD-10-CM | POA: Diagnosis not present

## 2017-10-02 DIAGNOSIS — N2581 Secondary hyperparathyroidism of renal origin: Secondary | ICD-10-CM | POA: Diagnosis not present

## 2017-10-02 DIAGNOSIS — Z23 Encounter for immunization: Secondary | ICD-10-CM | POA: Diagnosis not present

## 2017-10-02 DIAGNOSIS — D631 Anemia in chronic kidney disease: Secondary | ICD-10-CM | POA: Diagnosis not present

## 2017-10-02 DIAGNOSIS — E875 Hyperkalemia: Secondary | ICD-10-CM | POA: Diagnosis not present

## 2017-10-04 DIAGNOSIS — N2581 Secondary hyperparathyroidism of renal origin: Secondary | ICD-10-CM | POA: Diagnosis not present

## 2017-10-04 DIAGNOSIS — E875 Hyperkalemia: Secondary | ICD-10-CM | POA: Diagnosis not present

## 2017-10-04 DIAGNOSIS — D631 Anemia in chronic kidney disease: Secondary | ICD-10-CM | POA: Diagnosis not present

## 2017-10-04 DIAGNOSIS — Z23 Encounter for immunization: Secondary | ICD-10-CM | POA: Diagnosis not present

## 2017-10-04 DIAGNOSIS — N186 End stage renal disease: Secondary | ICD-10-CM | POA: Diagnosis not present

## 2017-10-05 DIAGNOSIS — N2581 Secondary hyperparathyroidism of renal origin: Secondary | ICD-10-CM | POA: Diagnosis not present

## 2017-10-05 DIAGNOSIS — E875 Hyperkalemia: Secondary | ICD-10-CM | POA: Diagnosis not present

## 2017-10-05 DIAGNOSIS — N186 End stage renal disease: Secondary | ICD-10-CM | POA: Diagnosis not present

## 2017-10-05 DIAGNOSIS — D631 Anemia in chronic kidney disease: Secondary | ICD-10-CM | POA: Diagnosis not present

## 2017-10-05 DIAGNOSIS — Z23 Encounter for immunization: Secondary | ICD-10-CM | POA: Diagnosis not present

## 2017-10-09 DIAGNOSIS — N2581 Secondary hyperparathyroidism of renal origin: Secondary | ICD-10-CM | POA: Diagnosis not present

## 2017-10-09 DIAGNOSIS — E875 Hyperkalemia: Secondary | ICD-10-CM | POA: Diagnosis not present

## 2017-10-09 DIAGNOSIS — D631 Anemia in chronic kidney disease: Secondary | ICD-10-CM | POA: Diagnosis not present

## 2017-10-09 DIAGNOSIS — Z23 Encounter for immunization: Secondary | ICD-10-CM | POA: Diagnosis not present

## 2017-10-09 DIAGNOSIS — N186 End stage renal disease: Secondary | ICD-10-CM | POA: Diagnosis not present

## 2017-10-11 DIAGNOSIS — E875 Hyperkalemia: Secondary | ICD-10-CM | POA: Diagnosis not present

## 2017-10-11 DIAGNOSIS — D631 Anemia in chronic kidney disease: Secondary | ICD-10-CM | POA: Diagnosis not present

## 2017-10-11 DIAGNOSIS — N186 End stage renal disease: Secondary | ICD-10-CM | POA: Diagnosis not present

## 2017-10-11 DIAGNOSIS — Z23 Encounter for immunization: Secondary | ICD-10-CM | POA: Diagnosis not present

## 2017-10-11 DIAGNOSIS — N2581 Secondary hyperparathyroidism of renal origin: Secondary | ICD-10-CM | POA: Diagnosis not present

## 2017-10-12 DIAGNOSIS — N186 End stage renal disease: Secondary | ICD-10-CM | POA: Diagnosis not present

## 2017-10-12 DIAGNOSIS — D631 Anemia in chronic kidney disease: Secondary | ICD-10-CM | POA: Diagnosis not present

## 2017-10-12 DIAGNOSIS — N2581 Secondary hyperparathyroidism of renal origin: Secondary | ICD-10-CM | POA: Diagnosis not present

## 2017-10-12 DIAGNOSIS — Z992 Dependence on renal dialysis: Secondary | ICD-10-CM | POA: Diagnosis not present

## 2017-10-12 DIAGNOSIS — N040 Nephrotic syndrome with minor glomerular abnormality: Secondary | ICD-10-CM | POA: Diagnosis not present

## 2017-10-12 DIAGNOSIS — E875 Hyperkalemia: Secondary | ICD-10-CM | POA: Diagnosis not present

## 2017-10-16 DIAGNOSIS — D631 Anemia in chronic kidney disease: Secondary | ICD-10-CM | POA: Diagnosis not present

## 2017-10-16 DIAGNOSIS — N186 End stage renal disease: Secondary | ICD-10-CM | POA: Diagnosis not present

## 2017-10-16 DIAGNOSIS — N2581 Secondary hyperparathyroidism of renal origin: Secondary | ICD-10-CM | POA: Diagnosis not present

## 2017-10-16 DIAGNOSIS — E875 Hyperkalemia: Secondary | ICD-10-CM | POA: Diagnosis not present

## 2017-10-18 DIAGNOSIS — E875 Hyperkalemia: Secondary | ICD-10-CM | POA: Diagnosis not present

## 2017-10-18 DIAGNOSIS — N2581 Secondary hyperparathyroidism of renal origin: Secondary | ICD-10-CM | POA: Diagnosis not present

## 2017-10-18 DIAGNOSIS — N186 End stage renal disease: Secondary | ICD-10-CM | POA: Diagnosis not present

## 2017-10-18 DIAGNOSIS — D631 Anemia in chronic kidney disease: Secondary | ICD-10-CM | POA: Diagnosis not present

## 2017-10-20 DIAGNOSIS — E875 Hyperkalemia: Secondary | ICD-10-CM | POA: Diagnosis not present

## 2017-10-20 DIAGNOSIS — N186 End stage renal disease: Secondary | ICD-10-CM | POA: Diagnosis not present

## 2017-10-20 DIAGNOSIS — N2581 Secondary hyperparathyroidism of renal origin: Secondary | ICD-10-CM | POA: Diagnosis not present

## 2017-10-20 DIAGNOSIS — D631 Anemia in chronic kidney disease: Secondary | ICD-10-CM | POA: Diagnosis not present

## 2017-10-23 DIAGNOSIS — N186 End stage renal disease: Secondary | ICD-10-CM | POA: Diagnosis not present

## 2017-10-23 DIAGNOSIS — E875 Hyperkalemia: Secondary | ICD-10-CM | POA: Diagnosis not present

## 2017-10-23 DIAGNOSIS — N2581 Secondary hyperparathyroidism of renal origin: Secondary | ICD-10-CM | POA: Diagnosis not present

## 2017-10-23 DIAGNOSIS — D631 Anemia in chronic kidney disease: Secondary | ICD-10-CM | POA: Diagnosis not present

## 2017-10-25 DIAGNOSIS — N2581 Secondary hyperparathyroidism of renal origin: Secondary | ICD-10-CM | POA: Diagnosis not present

## 2017-10-25 DIAGNOSIS — D631 Anemia in chronic kidney disease: Secondary | ICD-10-CM | POA: Diagnosis not present

## 2017-10-25 DIAGNOSIS — E875 Hyperkalemia: Secondary | ICD-10-CM | POA: Diagnosis not present

## 2017-10-25 DIAGNOSIS — N186 End stage renal disease: Secondary | ICD-10-CM | POA: Diagnosis not present

## 2017-10-26 DIAGNOSIS — N2581 Secondary hyperparathyroidism of renal origin: Secondary | ICD-10-CM | POA: Diagnosis not present

## 2017-10-26 DIAGNOSIS — E875 Hyperkalemia: Secondary | ICD-10-CM | POA: Diagnosis not present

## 2017-10-26 DIAGNOSIS — D631 Anemia in chronic kidney disease: Secondary | ICD-10-CM | POA: Diagnosis not present

## 2017-10-26 DIAGNOSIS — N186 End stage renal disease: Secondary | ICD-10-CM | POA: Diagnosis not present

## 2017-10-30 DIAGNOSIS — N2581 Secondary hyperparathyroidism of renal origin: Secondary | ICD-10-CM | POA: Diagnosis not present

## 2017-10-30 DIAGNOSIS — D631 Anemia in chronic kidney disease: Secondary | ICD-10-CM | POA: Diagnosis not present

## 2017-10-30 DIAGNOSIS — N186 End stage renal disease: Secondary | ICD-10-CM | POA: Diagnosis not present

## 2017-10-30 DIAGNOSIS — E875 Hyperkalemia: Secondary | ICD-10-CM | POA: Diagnosis not present

## 2017-11-01 DIAGNOSIS — E875 Hyperkalemia: Secondary | ICD-10-CM | POA: Diagnosis not present

## 2017-11-01 DIAGNOSIS — N186 End stage renal disease: Secondary | ICD-10-CM | POA: Diagnosis not present

## 2017-11-01 DIAGNOSIS — D631 Anemia in chronic kidney disease: Secondary | ICD-10-CM | POA: Diagnosis not present

## 2017-11-01 DIAGNOSIS — N2581 Secondary hyperparathyroidism of renal origin: Secondary | ICD-10-CM | POA: Diagnosis not present

## 2017-11-02 DIAGNOSIS — N2581 Secondary hyperparathyroidism of renal origin: Secondary | ICD-10-CM | POA: Diagnosis not present

## 2017-11-02 DIAGNOSIS — E875 Hyperkalemia: Secondary | ICD-10-CM | POA: Diagnosis not present

## 2017-11-02 DIAGNOSIS — N186 End stage renal disease: Secondary | ICD-10-CM | POA: Diagnosis not present

## 2017-11-02 DIAGNOSIS — D631 Anemia in chronic kidney disease: Secondary | ICD-10-CM | POA: Diagnosis not present

## 2017-11-06 DIAGNOSIS — D631 Anemia in chronic kidney disease: Secondary | ICD-10-CM | POA: Diagnosis not present

## 2017-11-06 DIAGNOSIS — E875 Hyperkalemia: Secondary | ICD-10-CM | POA: Diagnosis not present

## 2017-11-06 DIAGNOSIS — N186 End stage renal disease: Secondary | ICD-10-CM | POA: Diagnosis not present

## 2017-11-06 DIAGNOSIS — N2581 Secondary hyperparathyroidism of renal origin: Secondary | ICD-10-CM | POA: Diagnosis not present

## 2017-11-07 DIAGNOSIS — Z992 Dependence on renal dialysis: Secondary | ICD-10-CM | POA: Diagnosis not present

## 2017-11-07 DIAGNOSIS — M79602 Pain in left arm: Secondary | ICD-10-CM | POA: Diagnosis not present

## 2017-11-07 DIAGNOSIS — I871 Compression of vein: Secondary | ICD-10-CM | POA: Diagnosis not present

## 2017-11-07 DIAGNOSIS — N186 End stage renal disease: Secondary | ICD-10-CM | POA: Diagnosis not present

## 2017-11-08 DIAGNOSIS — D631 Anemia in chronic kidney disease: Secondary | ICD-10-CM | POA: Diagnosis not present

## 2017-11-08 DIAGNOSIS — N2581 Secondary hyperparathyroidism of renal origin: Secondary | ICD-10-CM | POA: Diagnosis not present

## 2017-11-08 DIAGNOSIS — N186 End stage renal disease: Secondary | ICD-10-CM | POA: Diagnosis not present

## 2017-11-08 DIAGNOSIS — E875 Hyperkalemia: Secondary | ICD-10-CM | POA: Diagnosis not present

## 2017-11-10 DIAGNOSIS — E875 Hyperkalemia: Secondary | ICD-10-CM | POA: Diagnosis not present

## 2017-11-10 DIAGNOSIS — N186 End stage renal disease: Secondary | ICD-10-CM | POA: Diagnosis not present

## 2017-11-10 DIAGNOSIS — N2581 Secondary hyperparathyroidism of renal origin: Secondary | ICD-10-CM | POA: Diagnosis not present

## 2017-11-10 DIAGNOSIS — D631 Anemia in chronic kidney disease: Secondary | ICD-10-CM | POA: Diagnosis not present

## 2017-11-12 DIAGNOSIS — Z992 Dependence on renal dialysis: Secondary | ICD-10-CM | POA: Diagnosis not present

## 2017-11-12 DIAGNOSIS — N040 Nephrotic syndrome with minor glomerular abnormality: Secondary | ICD-10-CM | POA: Diagnosis not present

## 2017-11-12 DIAGNOSIS — N186 End stage renal disease: Secondary | ICD-10-CM | POA: Diagnosis not present

## 2017-11-13 DIAGNOSIS — N2581 Secondary hyperparathyroidism of renal origin: Secondary | ICD-10-CM | POA: Diagnosis not present

## 2017-11-13 DIAGNOSIS — N186 End stage renal disease: Secondary | ICD-10-CM | POA: Diagnosis not present

## 2017-11-13 DIAGNOSIS — D631 Anemia in chronic kidney disease: Secondary | ICD-10-CM | POA: Diagnosis not present

## 2017-11-13 DIAGNOSIS — E875 Hyperkalemia: Secondary | ICD-10-CM | POA: Diagnosis not present

## 2017-11-15 DIAGNOSIS — N186 End stage renal disease: Secondary | ICD-10-CM | POA: Diagnosis not present

## 2017-11-15 DIAGNOSIS — D631 Anemia in chronic kidney disease: Secondary | ICD-10-CM | POA: Diagnosis not present

## 2017-11-15 DIAGNOSIS — N2581 Secondary hyperparathyroidism of renal origin: Secondary | ICD-10-CM | POA: Diagnosis not present

## 2017-11-15 DIAGNOSIS — E875 Hyperkalemia: Secondary | ICD-10-CM | POA: Diagnosis not present

## 2017-11-17 DIAGNOSIS — N2581 Secondary hyperparathyroidism of renal origin: Secondary | ICD-10-CM | POA: Diagnosis not present

## 2017-11-17 DIAGNOSIS — E875 Hyperkalemia: Secondary | ICD-10-CM | POA: Diagnosis not present

## 2017-11-17 DIAGNOSIS — N186 End stage renal disease: Secondary | ICD-10-CM | POA: Diagnosis not present

## 2017-11-17 DIAGNOSIS — D631 Anemia in chronic kidney disease: Secondary | ICD-10-CM | POA: Diagnosis not present

## 2017-11-20 DIAGNOSIS — E875 Hyperkalemia: Secondary | ICD-10-CM | POA: Diagnosis not present

## 2017-11-20 DIAGNOSIS — N2581 Secondary hyperparathyroidism of renal origin: Secondary | ICD-10-CM | POA: Diagnosis not present

## 2017-11-20 DIAGNOSIS — N186 End stage renal disease: Secondary | ICD-10-CM | POA: Diagnosis not present

## 2017-11-20 DIAGNOSIS — D631 Anemia in chronic kidney disease: Secondary | ICD-10-CM | POA: Diagnosis not present

## 2017-11-21 DIAGNOSIS — D631 Anemia in chronic kidney disease: Secondary | ICD-10-CM | POA: Diagnosis not present

## 2017-11-21 DIAGNOSIS — E875 Hyperkalemia: Secondary | ICD-10-CM | POA: Diagnosis not present

## 2017-11-21 DIAGNOSIS — N186 End stage renal disease: Secondary | ICD-10-CM | POA: Diagnosis not present

## 2017-11-21 DIAGNOSIS — N2581 Secondary hyperparathyroidism of renal origin: Secondary | ICD-10-CM | POA: Diagnosis not present

## 2017-11-24 DIAGNOSIS — N186 End stage renal disease: Secondary | ICD-10-CM | POA: Diagnosis not present

## 2017-11-24 DIAGNOSIS — D631 Anemia in chronic kidney disease: Secondary | ICD-10-CM | POA: Diagnosis not present

## 2017-11-24 DIAGNOSIS — E875 Hyperkalemia: Secondary | ICD-10-CM | POA: Diagnosis not present

## 2017-11-24 DIAGNOSIS — N2581 Secondary hyperparathyroidism of renal origin: Secondary | ICD-10-CM | POA: Diagnosis not present

## 2017-11-27 DIAGNOSIS — N2581 Secondary hyperparathyroidism of renal origin: Secondary | ICD-10-CM | POA: Diagnosis not present

## 2017-11-27 DIAGNOSIS — D631 Anemia in chronic kidney disease: Secondary | ICD-10-CM | POA: Diagnosis not present

## 2017-11-27 DIAGNOSIS — E875 Hyperkalemia: Secondary | ICD-10-CM | POA: Diagnosis not present

## 2017-11-27 DIAGNOSIS — N186 End stage renal disease: Secondary | ICD-10-CM | POA: Diagnosis not present

## 2017-11-29 DIAGNOSIS — D631 Anemia in chronic kidney disease: Secondary | ICD-10-CM | POA: Diagnosis not present

## 2017-11-29 DIAGNOSIS — E1129 Type 2 diabetes mellitus with other diabetic kidney complication: Secondary | ICD-10-CM | POA: Diagnosis not present

## 2017-11-29 DIAGNOSIS — N2581 Secondary hyperparathyroidism of renal origin: Secondary | ICD-10-CM | POA: Diagnosis not present

## 2017-11-29 DIAGNOSIS — E039 Hypothyroidism, unspecified: Secondary | ICD-10-CM | POA: Diagnosis not present

## 2017-11-29 DIAGNOSIS — E875 Hyperkalemia: Secondary | ICD-10-CM | POA: Diagnosis not present

## 2017-11-29 DIAGNOSIS — E119 Type 2 diabetes mellitus without complications: Secondary | ICD-10-CM | POA: Diagnosis not present

## 2017-11-29 DIAGNOSIS — N186 End stage renal disease: Secondary | ICD-10-CM | POA: Diagnosis not present

## 2017-12-01 DIAGNOSIS — N186 End stage renal disease: Secondary | ICD-10-CM | POA: Diagnosis not present

## 2017-12-01 DIAGNOSIS — D631 Anemia in chronic kidney disease: Secondary | ICD-10-CM | POA: Diagnosis not present

## 2017-12-01 DIAGNOSIS — N2581 Secondary hyperparathyroidism of renal origin: Secondary | ICD-10-CM | POA: Diagnosis not present

## 2017-12-01 DIAGNOSIS — E875 Hyperkalemia: Secondary | ICD-10-CM | POA: Diagnosis not present

## 2017-12-04 DIAGNOSIS — N2581 Secondary hyperparathyroidism of renal origin: Secondary | ICD-10-CM | POA: Diagnosis not present

## 2017-12-04 DIAGNOSIS — M25512 Pain in left shoulder: Secondary | ICD-10-CM | POA: Diagnosis not present

## 2017-12-04 DIAGNOSIS — E875 Hyperkalemia: Secondary | ICD-10-CM | POA: Diagnosis not present

## 2017-12-04 DIAGNOSIS — M25511 Pain in right shoulder: Secondary | ICD-10-CM | POA: Diagnosis not present

## 2017-12-04 DIAGNOSIS — D631 Anemia in chronic kidney disease: Secondary | ICD-10-CM | POA: Diagnosis not present

## 2017-12-04 DIAGNOSIS — N186 End stage renal disease: Secondary | ICD-10-CM | POA: Diagnosis not present

## 2017-12-06 DIAGNOSIS — N2581 Secondary hyperparathyroidism of renal origin: Secondary | ICD-10-CM | POA: Diagnosis not present

## 2017-12-06 DIAGNOSIS — D631 Anemia in chronic kidney disease: Secondary | ICD-10-CM | POA: Diagnosis not present

## 2017-12-06 DIAGNOSIS — E875 Hyperkalemia: Secondary | ICD-10-CM | POA: Diagnosis not present

## 2017-12-06 DIAGNOSIS — N186 End stage renal disease: Secondary | ICD-10-CM | POA: Diagnosis not present

## 2017-12-08 DIAGNOSIS — D631 Anemia in chronic kidney disease: Secondary | ICD-10-CM | POA: Diagnosis not present

## 2017-12-08 DIAGNOSIS — N186 End stage renal disease: Secondary | ICD-10-CM | POA: Diagnosis not present

## 2017-12-08 DIAGNOSIS — N2581 Secondary hyperparathyroidism of renal origin: Secondary | ICD-10-CM | POA: Diagnosis not present

## 2017-12-08 DIAGNOSIS — E875 Hyperkalemia: Secondary | ICD-10-CM | POA: Diagnosis not present

## 2017-12-11 DIAGNOSIS — N2581 Secondary hyperparathyroidism of renal origin: Secondary | ICD-10-CM | POA: Diagnosis not present

## 2017-12-11 DIAGNOSIS — D631 Anemia in chronic kidney disease: Secondary | ICD-10-CM | POA: Diagnosis not present

## 2017-12-11 DIAGNOSIS — E875 Hyperkalemia: Secondary | ICD-10-CM | POA: Diagnosis not present

## 2017-12-11 DIAGNOSIS — N186 End stage renal disease: Secondary | ICD-10-CM | POA: Diagnosis not present

## 2017-12-12 DIAGNOSIS — Z992 Dependence on renal dialysis: Secondary | ICD-10-CM | POA: Diagnosis not present

## 2017-12-12 DIAGNOSIS — N186 End stage renal disease: Secondary | ICD-10-CM | POA: Diagnosis not present

## 2017-12-12 DIAGNOSIS — M25519 Pain in unspecified shoulder: Secondary | ICD-10-CM | POA: Diagnosis not present

## 2017-12-12 DIAGNOSIS — M5412 Radiculopathy, cervical region: Secondary | ICD-10-CM | POA: Diagnosis not present

## 2017-12-12 DIAGNOSIS — N040 Nephrotic syndrome with minor glomerular abnormality: Secondary | ICD-10-CM | POA: Diagnosis not present

## 2017-12-13 DIAGNOSIS — D631 Anemia in chronic kidney disease: Secondary | ICD-10-CM | POA: Diagnosis not present

## 2017-12-13 DIAGNOSIS — N186 End stage renal disease: Secondary | ICD-10-CM | POA: Diagnosis not present

## 2017-12-13 DIAGNOSIS — N2581 Secondary hyperparathyroidism of renal origin: Secondary | ICD-10-CM | POA: Diagnosis not present

## 2017-12-13 DIAGNOSIS — E875 Hyperkalemia: Secondary | ICD-10-CM | POA: Diagnosis not present

## 2017-12-15 DIAGNOSIS — N186 End stage renal disease: Secondary | ICD-10-CM | POA: Diagnosis not present

## 2017-12-15 DIAGNOSIS — N2581 Secondary hyperparathyroidism of renal origin: Secondary | ICD-10-CM | POA: Diagnosis not present

## 2017-12-15 DIAGNOSIS — D631 Anemia in chronic kidney disease: Secondary | ICD-10-CM | POA: Diagnosis not present

## 2017-12-15 DIAGNOSIS — E875 Hyperkalemia: Secondary | ICD-10-CM | POA: Diagnosis not present

## 2017-12-18 DIAGNOSIS — D631 Anemia in chronic kidney disease: Secondary | ICD-10-CM | POA: Diagnosis not present

## 2017-12-18 DIAGNOSIS — N186 End stage renal disease: Secondary | ICD-10-CM | POA: Diagnosis not present

## 2017-12-18 DIAGNOSIS — E875 Hyperkalemia: Secondary | ICD-10-CM | POA: Diagnosis not present

## 2017-12-18 DIAGNOSIS — N2581 Secondary hyperparathyroidism of renal origin: Secondary | ICD-10-CM | POA: Diagnosis not present

## 2017-12-19 DIAGNOSIS — M5412 Radiculopathy, cervical region: Secondary | ICD-10-CM | POA: Diagnosis not present

## 2017-12-19 DIAGNOSIS — M25519 Pain in unspecified shoulder: Secondary | ICD-10-CM | POA: Diagnosis not present

## 2017-12-20 DIAGNOSIS — E875 Hyperkalemia: Secondary | ICD-10-CM | POA: Diagnosis not present

## 2017-12-20 DIAGNOSIS — D631 Anemia in chronic kidney disease: Secondary | ICD-10-CM | POA: Diagnosis not present

## 2017-12-20 DIAGNOSIS — N186 End stage renal disease: Secondary | ICD-10-CM | POA: Diagnosis not present

## 2017-12-20 DIAGNOSIS — N2581 Secondary hyperparathyroidism of renal origin: Secondary | ICD-10-CM | POA: Diagnosis not present

## 2017-12-21 ENCOUNTER — Other Ambulatory Visit: Payer: Self-pay | Admitting: Family Medicine

## 2017-12-21 DIAGNOSIS — M25519 Pain in unspecified shoulder: Secondary | ICD-10-CM | POA: Diagnosis not present

## 2017-12-21 DIAGNOSIS — M5412 Radiculopathy, cervical region: Secondary | ICD-10-CM | POA: Diagnosis not present

## 2017-12-22 DIAGNOSIS — D631 Anemia in chronic kidney disease: Secondary | ICD-10-CM | POA: Diagnosis not present

## 2017-12-22 DIAGNOSIS — N2581 Secondary hyperparathyroidism of renal origin: Secondary | ICD-10-CM | POA: Diagnosis not present

## 2017-12-22 DIAGNOSIS — E875 Hyperkalemia: Secondary | ICD-10-CM | POA: Diagnosis not present

## 2017-12-22 DIAGNOSIS — N186 End stage renal disease: Secondary | ICD-10-CM | POA: Diagnosis not present

## 2017-12-24 ENCOUNTER — Ambulatory Visit: Payer: Medicare Other | Admitting: Orthopedic Surgery

## 2017-12-24 ENCOUNTER — Encounter

## 2017-12-25 DIAGNOSIS — N2581 Secondary hyperparathyroidism of renal origin: Secondary | ICD-10-CM | POA: Diagnosis not present

## 2017-12-25 DIAGNOSIS — D631 Anemia in chronic kidney disease: Secondary | ICD-10-CM | POA: Diagnosis not present

## 2017-12-25 DIAGNOSIS — N186 End stage renal disease: Secondary | ICD-10-CM | POA: Diagnosis not present

## 2017-12-25 DIAGNOSIS — E875 Hyperkalemia: Secondary | ICD-10-CM | POA: Diagnosis not present

## 2017-12-26 DIAGNOSIS — M5412 Radiculopathy, cervical region: Secondary | ICD-10-CM | POA: Diagnosis not present

## 2017-12-26 DIAGNOSIS — M25519 Pain in unspecified shoulder: Secondary | ICD-10-CM | POA: Diagnosis not present

## 2017-12-27 DIAGNOSIS — E875 Hyperkalemia: Secondary | ICD-10-CM | POA: Diagnosis not present

## 2017-12-27 DIAGNOSIS — N186 End stage renal disease: Secondary | ICD-10-CM | POA: Diagnosis not present

## 2017-12-27 DIAGNOSIS — N2581 Secondary hyperparathyroidism of renal origin: Secondary | ICD-10-CM | POA: Diagnosis not present

## 2017-12-27 DIAGNOSIS — D631 Anemia in chronic kidney disease: Secondary | ICD-10-CM | POA: Diagnosis not present

## 2017-12-28 DIAGNOSIS — M25519 Pain in unspecified shoulder: Secondary | ICD-10-CM | POA: Diagnosis not present

## 2017-12-28 DIAGNOSIS — M5412 Radiculopathy, cervical region: Secondary | ICD-10-CM | POA: Diagnosis not present

## 2017-12-29 DIAGNOSIS — E875 Hyperkalemia: Secondary | ICD-10-CM | POA: Diagnosis not present

## 2017-12-29 DIAGNOSIS — N2581 Secondary hyperparathyroidism of renal origin: Secondary | ICD-10-CM | POA: Diagnosis not present

## 2017-12-29 DIAGNOSIS — N186 End stage renal disease: Secondary | ICD-10-CM | POA: Diagnosis not present

## 2017-12-29 DIAGNOSIS — D631 Anemia in chronic kidney disease: Secondary | ICD-10-CM | POA: Diagnosis not present

## 2017-12-31 DIAGNOSIS — N2581 Secondary hyperparathyroidism of renal origin: Secondary | ICD-10-CM | POA: Diagnosis not present

## 2017-12-31 DIAGNOSIS — E875 Hyperkalemia: Secondary | ICD-10-CM | POA: Diagnosis not present

## 2017-12-31 DIAGNOSIS — D631 Anemia in chronic kidney disease: Secondary | ICD-10-CM | POA: Diagnosis not present

## 2017-12-31 DIAGNOSIS — N186 End stage renal disease: Secondary | ICD-10-CM | POA: Diagnosis not present

## 2018-01-02 DIAGNOSIS — M25519 Pain in unspecified shoulder: Secondary | ICD-10-CM | POA: Diagnosis not present

## 2018-01-02 DIAGNOSIS — M5412 Radiculopathy, cervical region: Secondary | ICD-10-CM | POA: Diagnosis not present

## 2018-01-03 DIAGNOSIS — N186 End stage renal disease: Secondary | ICD-10-CM | POA: Diagnosis not present

## 2018-01-03 DIAGNOSIS — N2581 Secondary hyperparathyroidism of renal origin: Secondary | ICD-10-CM | POA: Diagnosis not present

## 2018-01-03 DIAGNOSIS — D631 Anemia in chronic kidney disease: Secondary | ICD-10-CM | POA: Diagnosis not present

## 2018-01-03 DIAGNOSIS — E875 Hyperkalemia: Secondary | ICD-10-CM | POA: Diagnosis not present

## 2018-01-04 DIAGNOSIS — M5412 Radiculopathy, cervical region: Secondary | ICD-10-CM | POA: Diagnosis not present

## 2018-01-04 DIAGNOSIS — M25519 Pain in unspecified shoulder: Secondary | ICD-10-CM | POA: Diagnosis not present

## 2018-01-05 DIAGNOSIS — N186 End stage renal disease: Secondary | ICD-10-CM | POA: Diagnosis not present

## 2018-01-05 DIAGNOSIS — E875 Hyperkalemia: Secondary | ICD-10-CM | POA: Diagnosis not present

## 2018-01-05 DIAGNOSIS — N2581 Secondary hyperparathyroidism of renal origin: Secondary | ICD-10-CM | POA: Diagnosis not present

## 2018-01-05 DIAGNOSIS — D631 Anemia in chronic kidney disease: Secondary | ICD-10-CM | POA: Diagnosis not present

## 2018-01-08 DIAGNOSIS — D631 Anemia in chronic kidney disease: Secondary | ICD-10-CM | POA: Diagnosis not present

## 2018-01-08 DIAGNOSIS — N186 End stage renal disease: Secondary | ICD-10-CM | POA: Diagnosis not present

## 2018-01-08 DIAGNOSIS — E875 Hyperkalemia: Secondary | ICD-10-CM | POA: Diagnosis not present

## 2018-01-08 DIAGNOSIS — N2581 Secondary hyperparathyroidism of renal origin: Secondary | ICD-10-CM | POA: Diagnosis not present

## 2018-01-09 DIAGNOSIS — M25519 Pain in unspecified shoulder: Secondary | ICD-10-CM | POA: Diagnosis not present

## 2018-01-09 DIAGNOSIS — M5412 Radiculopathy, cervical region: Secondary | ICD-10-CM | POA: Diagnosis not present

## 2018-01-10 ENCOUNTER — Other Ambulatory Visit: Payer: Self-pay | Admitting: Family Medicine

## 2018-01-10 DIAGNOSIS — N2581 Secondary hyperparathyroidism of renal origin: Secondary | ICD-10-CM | POA: Diagnosis not present

## 2018-01-10 DIAGNOSIS — N186 End stage renal disease: Secondary | ICD-10-CM | POA: Diagnosis not present

## 2018-01-10 DIAGNOSIS — Z1231 Encounter for screening mammogram for malignant neoplasm of breast: Secondary | ICD-10-CM

## 2018-01-10 DIAGNOSIS — D631 Anemia in chronic kidney disease: Secondary | ICD-10-CM | POA: Diagnosis not present

## 2018-01-10 DIAGNOSIS — E875 Hyperkalemia: Secondary | ICD-10-CM | POA: Diagnosis not present

## 2018-01-11 DIAGNOSIS — M5412 Radiculopathy, cervical region: Secondary | ICD-10-CM | POA: Diagnosis not present

## 2018-01-11 DIAGNOSIS — M25519 Pain in unspecified shoulder: Secondary | ICD-10-CM | POA: Diagnosis not present

## 2018-01-12 DIAGNOSIS — N2581 Secondary hyperparathyroidism of renal origin: Secondary | ICD-10-CM | POA: Diagnosis not present

## 2018-01-12 DIAGNOSIS — E875 Hyperkalemia: Secondary | ICD-10-CM | POA: Diagnosis not present

## 2018-01-12 DIAGNOSIS — D631 Anemia in chronic kidney disease: Secondary | ICD-10-CM | POA: Diagnosis not present

## 2018-01-12 DIAGNOSIS — M542 Cervicalgia: Secondary | ICD-10-CM | POA: Diagnosis not present

## 2018-01-12 DIAGNOSIS — N040 Nephrotic syndrome with minor glomerular abnormality: Secondary | ICD-10-CM | POA: Diagnosis not present

## 2018-01-12 DIAGNOSIS — N186 End stage renal disease: Secondary | ICD-10-CM | POA: Diagnosis not present

## 2018-01-12 DIAGNOSIS — Z992 Dependence on renal dialysis: Secondary | ICD-10-CM | POA: Diagnosis not present

## 2018-01-15 DIAGNOSIS — M542 Cervicalgia: Secondary | ICD-10-CM | POA: Diagnosis not present

## 2018-01-15 DIAGNOSIS — E875 Hyperkalemia: Secondary | ICD-10-CM | POA: Diagnosis not present

## 2018-01-15 DIAGNOSIS — N2581 Secondary hyperparathyroidism of renal origin: Secondary | ICD-10-CM | POA: Diagnosis not present

## 2018-01-15 DIAGNOSIS — D631 Anemia in chronic kidney disease: Secondary | ICD-10-CM | POA: Diagnosis not present

## 2018-01-15 DIAGNOSIS — N186 End stage renal disease: Secondary | ICD-10-CM | POA: Diagnosis not present

## 2018-01-16 DIAGNOSIS — E875 Hyperkalemia: Secondary | ICD-10-CM | POA: Diagnosis not present

## 2018-01-16 DIAGNOSIS — N186 End stage renal disease: Secondary | ICD-10-CM | POA: Diagnosis not present

## 2018-01-16 DIAGNOSIS — N2581 Secondary hyperparathyroidism of renal origin: Secondary | ICD-10-CM | POA: Diagnosis not present

## 2018-01-16 DIAGNOSIS — D631 Anemia in chronic kidney disease: Secondary | ICD-10-CM | POA: Diagnosis not present

## 2018-01-17 DIAGNOSIS — E875 Hyperkalemia: Secondary | ICD-10-CM | POA: Diagnosis not present

## 2018-01-17 DIAGNOSIS — N2581 Secondary hyperparathyroidism of renal origin: Secondary | ICD-10-CM | POA: Diagnosis not present

## 2018-01-17 DIAGNOSIS — N186 End stage renal disease: Secondary | ICD-10-CM | POA: Diagnosis not present

## 2018-01-17 DIAGNOSIS — D631 Anemia in chronic kidney disease: Secondary | ICD-10-CM | POA: Diagnosis not present

## 2018-01-19 DIAGNOSIS — D631 Anemia in chronic kidney disease: Secondary | ICD-10-CM | POA: Diagnosis not present

## 2018-01-19 DIAGNOSIS — N186 End stage renal disease: Secondary | ICD-10-CM | POA: Diagnosis not present

## 2018-01-19 DIAGNOSIS — N2581 Secondary hyperparathyroidism of renal origin: Secondary | ICD-10-CM | POA: Diagnosis not present

## 2018-01-19 DIAGNOSIS — E875 Hyperkalemia: Secondary | ICD-10-CM | POA: Diagnosis not present

## 2018-01-22 DIAGNOSIS — N2581 Secondary hyperparathyroidism of renal origin: Secondary | ICD-10-CM | POA: Diagnosis not present

## 2018-01-22 DIAGNOSIS — E875 Hyperkalemia: Secondary | ICD-10-CM | POA: Diagnosis not present

## 2018-01-22 DIAGNOSIS — D631 Anemia in chronic kidney disease: Secondary | ICD-10-CM | POA: Diagnosis not present

## 2018-01-22 DIAGNOSIS — N186 End stage renal disease: Secondary | ICD-10-CM | POA: Diagnosis not present

## 2018-01-24 ENCOUNTER — Encounter: Payer: Self-pay | Admitting: Family Medicine

## 2018-01-24 ENCOUNTER — Ambulatory Visit (INDEPENDENT_AMBULATORY_CARE_PROVIDER_SITE_OTHER): Payer: Medicare Other | Admitting: Family Medicine

## 2018-01-24 ENCOUNTER — Other Ambulatory Visit: Payer: Self-pay

## 2018-01-24 VITALS — BP 124/68 | HR 88 | Temp 98.7°F | Resp 14 | Ht 62.0 in | Wt 172.0 lb

## 2018-01-24 DIAGNOSIS — N2581 Secondary hyperparathyroidism of renal origin: Secondary | ICD-10-CM | POA: Diagnosis not present

## 2018-01-24 DIAGNOSIS — N186 End stage renal disease: Secondary | ICD-10-CM | POA: Diagnosis not present

## 2018-01-24 DIAGNOSIS — W57XXXA Bitten or stung by nonvenomous insect and other nonvenomous arthropods, initial encounter: Secondary | ICD-10-CM | POA: Diagnosis not present

## 2018-01-24 DIAGNOSIS — L299 Pruritus, unspecified: Secondary | ICD-10-CM

## 2018-01-24 DIAGNOSIS — D631 Anemia in chronic kidney disease: Secondary | ICD-10-CM | POA: Diagnosis not present

## 2018-01-24 DIAGNOSIS — S80861A Insect bite (nonvenomous), right lower leg, initial encounter: Secondary | ICD-10-CM | POA: Diagnosis not present

## 2018-01-24 DIAGNOSIS — E875 Hyperkalemia: Secondary | ICD-10-CM | POA: Diagnosis not present

## 2018-01-24 MED ORDER — TRIAMCINOLONE ACETONIDE 0.1 % EX CREA: 1.0000 "application " | TOPICAL_CREAM | Freq: Two times a day (BID) | CUTANEOUS | 0 refills | Status: DC

## 2018-01-24 NOTE — Patient Instructions (Signed)
Try benadryl 12.5 mg dose 1-2 times a day for severe itching or burning.  You can also try a zyrtec, allegra or claritin in 24 hour form first to see if it improves.  Apply topical benadryl cream available over the counter.  Can use topical steroid cream.  Apply ice   Follow up if it gets worse or with any spreading redness or swelling

## 2018-01-24 NOTE — Progress Notes (Signed)
Patient ID: TEREZA GILHAM, female    DOB: Nov 16, 1960, 57 y.o.   MRN: [id number]  PCP: [name], MD  Chief Complaint  Patient presents with  . Insect Bite    irritation to inner R ankle- has been using alcohol and Neosporin with no relief    Subjective:   JAKELIN TAUSSIG is a 57 y.o. female, presents to clinic with CC of itchy irritated bug bite to right medial ankle, onset 5 days ago, pain 10/10 irritation, itching and burning,  No relief with applying alcohol and neosporin.  No spreading of itching or rash.   Pt is on dialysis.  She denies any other sx.     Patient Active Problem List   Diagnosis Date Noted  . Rectal bleeding 06/08/2016  . Vaginal discharge 12/30/2015  . Abdominal pain, epigastric 11/26/2013  . GERD (gastroesophageal reflux disease)   . Type 2 diabetes mellitus ([place])   . CKD (chronic kidney disease) stage 3, GFR 30-59 ml/min (HCC)   . External hemorrhoid 08/01/2011  . Hematochezia 07/13/2011  . Atypical chest pain 04/21/2010  . Mixed hyperlipidemia 03/11/2008  . OBESITY 03/11/2008  . Essential hypertension, benign 03/11/2008     Prior to Admission medications   Medication Sig Start Date End Date Taking? Authorizing Provider  aspirin EC 81 MG tablet Take 81 mg by mouth daily.     Yes [provider]  atorvastatin (LIPITOR) 20 MG tablet TAKE 1 TABLET DAILY 12/21/17  Yes Pickard, Cammie Mcgee, MD  BYSTOLIC 10 MG tablet TAKE 1 TABLET TWICE A DAY Patient taking differently: Take 10 mg by mouth twice daily 03/05/17  Yes Orlena Sheldon, PA-C  cinacalcet (SENSIPAR) 30 MG tablet Take 30 mg by mouth daily.   Yes [provider]  cloNIDine (CATAPRES) 0.1 MG tablet Take 0.1 mg by mouth 2 (two) times daily.   Yes [provider]  ferric citrate (AURYXIA) 1 GM 210 MG(Fe) tablet Take 420 mg by mouth 3 (three) times daily with meals.   Yes [provider]  losartan (COZAAR) 100 MG tablet TAKE 1 TABLET DAILY Patient  taking differently: Take 50 mg by mouth at bedtime 02/23/17  Yes [name], MD  Multiple Vitamin (DAILY VITES PO) Take by mouth.   Yes [provider]     No Known Allergies  Family History  Problem Relation Age of Onset  . Hypertension Mother   . Coronary artery disease Mother   . Diabetes Mother   . Hypertension Sister   . Coronary artery disease Sister   . Hypertension Brother   . Colon cancer Neg Hx      Social History   Socioeconomic History  . Marital status: Married    Spouse name: Not on file  . Number of children: Not on file  . Years of education: Not on file  . Highest education level: Not on file  Occupational History  . Occupation: Merchandiser, retail: [place] # [phone]  Social Needs  . Financial resource strain: Not on file  . Food insecurity:    Worry: Not on file    Inability: Not on file  . Transportation needs:    Medical: Not on file    Non-medical: Not on file  Tobacco Use  . Smoking status: Never Smoker  . Smokeless tobacco: Never Used  Substance and Sexual Activity  . Alcohol use: No  . Drug use: No  . Sexual activity: Yes    Birth control/protection: Surgical  Lifestyle  . Physical activity:    Days per week: Not on file    Minutes per session: Not on file  . Stress: Not on file  Relationships  . Social connections:    Talks on phone: Not on file    Gets together: Not on file    Attends religious service: Not on file    Active member of club or organization: Not on file    Attends meetings of clubs or organizations: Not on file    Relationship status: Not on file  . Intimate partner violence:    Fear of  current or ex partner: Not on file    Emotionally abused: Not on file    Physically abused: Not on file    Forced sexual activity: Not on file  Other Topics Concern  . Not on file  Social History Narrative   Works at [employer] in [place].    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   Also has to get items from high shelves-causes achy pain in shoulder area      Married.   Children are grown, out of house.     Review of Systems  Constitutional: Negative.   HENT: Negative.  Negative for sneezing, sore throat, trouble swallowing and voice change.   Eyes: Negative.   Respiratory: Negative.  Negative for chest tightness, shortness of breath and wheezing.   Cardiovascular: Negative.   Gastrointestinal: Negative.   Musculoskeletal: Negative.   Skin: Negative for pallor and wound.  Neurological: Negative.   Hematological: Negative.   Psychiatric/Behavioral: Negative.   All other systems reviewed and are negative.      Objective:    Vitals:   01/24/18 1126  BP: 124/68  Pulse: 88  Resp: 14  Temp: 98.7 F (37.1 C)  TempSrc: Oral  SpO2: 98%  Weight: 172 lb (78 kg)  Height: 5\' 2"  (1.575 m)      Physical Exam  Constitutional: She appears well-developed and well-nourished. No distress.  HENT:  Head: Normocephalic and atraumatic.  Nose: Nose normal.  Mouth/Throat: Oropharynx is clear and moist.  Eyes: Pupils are equal, round, and reactive to light. Conjunctivae are normal. Right eye exhibits no discharge. Left eye exhibits no discharge.  Neck: No tracheal deviation present.  Cardiovascular: Normal rate and regular  rhythm.  Left forearm AV fistula covered with bandage  Pulmonary/Chest: Effort normal. No stridor. No respiratory distress.  Musculoskeletal: Normal range of motion.  Neurological: She is alert. She exhibits normal muscle tone. Coordination normal.  Skin: Skin is warm. No rash noted. She is not diaphoretic.  Right medial ankle superior to medial  malleolus very mildly edematous skin w/o rash.  No redness.  Pt states ttp.  No induration, no warmth.    Psychiatric: She has a normal mood and affect. Her behavior is normal.  Nursing note and vitals reviewed.         Assessment & Plan:      ICD-10-CM   1. Pruritus L29.9   2. Insect bite of right lower leg, initial encounter G89.169I    W57.XXXA     Pt with itchy bug bite to right ankle x 5 days.  Has tried alcohol and antibiotic ointment.  Advised to try topical steroid cream/benadryl cream or low dose benadryl 12.5 mg PO BID PRN for severe itching, and oral claritin/zyrtec/allegra in the meantime.  All meds looked up, no renal dosing adjustments required, but I still dosed conservatively.  Pt did not want steroids, but advised her if it continues we may need to use steroids, would consult her PCP about that.  She looked well, no distress, no concern for skin infection, spreading rash, or allergic rxn involving airway.     Delsa Grana, PA-C 01/24/18 11:32 AM

## 2018-01-26 DIAGNOSIS — E875 Hyperkalemia: Secondary | ICD-10-CM | POA: Diagnosis not present

## 2018-01-26 DIAGNOSIS — N186 End stage renal disease: Secondary | ICD-10-CM | POA: Diagnosis not present

## 2018-01-26 DIAGNOSIS — D631 Anemia in chronic kidney disease: Secondary | ICD-10-CM | POA: Diagnosis not present

## 2018-01-26 DIAGNOSIS — N2581 Secondary hyperparathyroidism of renal origin: Secondary | ICD-10-CM | POA: Diagnosis not present

## 2018-01-29 DIAGNOSIS — N186 End stage renal disease: Secondary | ICD-10-CM | POA: Diagnosis not present

## 2018-01-29 DIAGNOSIS — N2581 Secondary hyperparathyroidism of renal origin: Secondary | ICD-10-CM | POA: Diagnosis not present

## 2018-01-29 DIAGNOSIS — E875 Hyperkalemia: Secondary | ICD-10-CM | POA: Diagnosis not present

## 2018-01-29 DIAGNOSIS — D631 Anemia in chronic kidney disease: Secondary | ICD-10-CM | POA: Diagnosis not present

## 2018-01-31 DIAGNOSIS — D631 Anemia in chronic kidney disease: Secondary | ICD-10-CM | POA: Diagnosis not present

## 2018-01-31 DIAGNOSIS — N2581 Secondary hyperparathyroidism of renal origin: Secondary | ICD-10-CM | POA: Diagnosis not present

## 2018-01-31 DIAGNOSIS — E875 Hyperkalemia: Secondary | ICD-10-CM | POA: Diagnosis not present

## 2018-01-31 DIAGNOSIS — N186 End stage renal disease: Secondary | ICD-10-CM | POA: Diagnosis not present

## 2018-02-02 DIAGNOSIS — N2581 Secondary hyperparathyroidism of renal origin: Secondary | ICD-10-CM | POA: Diagnosis not present

## 2018-02-02 DIAGNOSIS — D631 Anemia in chronic kidney disease: Secondary | ICD-10-CM | POA: Diagnosis not present

## 2018-02-02 DIAGNOSIS — N186 End stage renal disease: Secondary | ICD-10-CM | POA: Diagnosis not present

## 2018-02-02 DIAGNOSIS — E875 Hyperkalemia: Secondary | ICD-10-CM | POA: Diagnosis not present

## 2018-02-05 DIAGNOSIS — N186 End stage renal disease: Secondary | ICD-10-CM | POA: Diagnosis not present

## 2018-02-05 DIAGNOSIS — N2581 Secondary hyperparathyroidism of renal origin: Secondary | ICD-10-CM | POA: Diagnosis not present

## 2018-02-05 DIAGNOSIS — E875 Hyperkalemia: Secondary | ICD-10-CM | POA: Diagnosis not present

## 2018-02-05 DIAGNOSIS — D631 Anemia in chronic kidney disease: Secondary | ICD-10-CM | POA: Diagnosis not present

## 2018-02-07 ENCOUNTER — Other Ambulatory Visit: Payer: Self-pay

## 2018-02-07 ENCOUNTER — Emergency Department (HOSPITAL_COMMUNITY): Payer: Medicare Other

## 2018-02-07 ENCOUNTER — Encounter (HOSPITAL_COMMUNITY): Payer: Self-pay | Admitting: Emergency Medicine

## 2018-02-07 ENCOUNTER — Emergency Department (HOSPITAL_COMMUNITY)
Admission: EM | Admit: 2018-02-07 | Discharge: 2018-02-07 | Disposition: A | Discharge status: Home / Self Care | Payer: Medicare Other | Attending: Emergency Medicine | Admitting: Emergency Medicine

## 2018-02-07 DIAGNOSIS — R112 Nausea with vomiting, unspecified: Secondary | ICD-10-CM | POA: Insufficient documentation

## 2018-02-07 DIAGNOSIS — N183 Chronic kidney disease, stage 3 (moderate): Secondary | ICD-10-CM | POA: Insufficient documentation

## 2018-02-07 DIAGNOSIS — R197 Diarrhea, unspecified: Secondary | ICD-10-CM | POA: Diagnosis not present

## 2018-02-07 DIAGNOSIS — R109 Unspecified abdominal pain: Secondary | ICD-10-CM | POA: Diagnosis not present

## 2018-02-07 DIAGNOSIS — R079 Chest pain, unspecified: Secondary | ICD-10-CM | POA: Diagnosis not present

## 2018-02-07 DIAGNOSIS — R1110 Vomiting, unspecified: Secondary | ICD-10-CM | POA: Diagnosis present

## 2018-02-07 DIAGNOSIS — I129 Hypertensive chronic kidney disease with stage 1 through stage 4 chronic kidney disease, or unspecified chronic kidney disease: Secondary | ICD-10-CM | POA: Diagnosis not present

## 2018-02-07 DIAGNOSIS — N2581 Secondary hyperparathyroidism of renal origin: Secondary | ICD-10-CM | POA: Diagnosis not present

## 2018-02-07 DIAGNOSIS — E119 Type 2 diabetes mellitus without complications: Secondary | ICD-10-CM | POA: Insufficient documentation

## 2018-02-07 DIAGNOSIS — N186 End stage renal disease: Secondary | ICD-10-CM | POA: Diagnosis not present

## 2018-02-07 DIAGNOSIS — D631 Anemia in chronic kidney disease: Secondary | ICD-10-CM | POA: Diagnosis not present

## 2018-02-07 DIAGNOSIS — E875 Hyperkalemia: Secondary | ICD-10-CM | POA: Diagnosis not present

## 2018-02-07 LAB — COMPREHENSIVE METABOLIC PANEL
ALT: 30 U/L (ref 0–44)
AST: 26 U/L (ref 15–41)
Albumin: 4 g/dL (ref 3.5–5.0)
Alkaline Phosphatase: 116 U/L (ref 38–126)
Anion gap: 15 (ref 5–15)
BUN: 17 mg/dL (ref 6–20)
CO2: 28 mmol/L (ref 22–32)
Calcium: 8.3 mg/dL — ABNORMAL LOW (ref 8.9–10.3)
Chloride: 95 mmol/L — ABNORMAL LOW (ref 98–111)
Creatinine, Ser: 5.26 mg/dL — ABNORMAL HIGH (ref 0.44–1.00)
GFR calc Af Amer: 10 mL/min — ABNORMAL LOW (ref >60)
GFR calc non Af Amer: 8 mL/min — ABNORMAL LOW (ref >60)
Glucose, Bld: 144 mg/dL — ABNORMAL HIGH (ref 70–99)
Potassium: 2.9 mmol/L — ABNORMAL LOW (ref 3.5–5.1)
Sodium: 138 mmol/L (ref 135–145)
Total Bilirubin: 0.4 mg/dL (ref 0.3–1.2)
Total Protein: 8.6 g/dL — ABNORMAL HIGH (ref 6.5–8.1)

## 2018-02-07 LAB — CBC
HCT: 38.5 % (ref 36.0–46.0)
Hemoglobin: 12.6 g/dL (ref 12.0–15.0)
MCH: 35.1 pg — ABNORMAL HIGH (ref 26.0–34.0)
MCHC: 32.7 g/dL (ref 30.0–36.0)
MCV: 107.2 fL — ABNORMAL HIGH (ref 78.0–100.0)
Platelets: 274 10*3/uL (ref 150–400)
RBC: 3.59 MIL/uL — ABNORMAL LOW (ref 3.87–5.11)
RDW: 15.9 % — ABNORMAL HIGH (ref 11.5–15.5)
WBC: 9.8 10*3/uL (ref 4.0–10.5)

## 2018-02-07 LAB — TROPONIN I: Troponin I: <0.03 ng/mL (ref <0.03)

## 2018-02-07 LAB — LIPASE, BLOOD: Lipase: 45 U/L (ref 11–51)

## 2018-02-07 MED ORDER — LOPERAMIDE HCL 2 MG PO CAPS: 2.0000 mg | ORAL_CAPSULE | Freq: Four times a day (QID) | ORAL | 0 refills | Status: DC | PRN

## 2018-02-07 MED ORDER — KETOROLAC TROMETHAMINE 30 MG/ML IJ SOLN
15.0000 mg | Freq: Once | INTRAMUSCULAR | Status: AC
  Administered 2018-02-07: 15 mg via INTRAVENOUS
  Filled 2018-02-07: qty 1

## 2018-02-07 MED ORDER — ONDANSETRON HCL 4 MG/2ML IJ SOLN
4.0000 mg | Freq: Once | INTRAMUSCULAR | Status: AC | PRN
  Administered 2018-02-07: 4 mg via INTRAVENOUS
  Filled 2018-02-07: qty 2

## 2018-02-07 MED ORDER — ONDANSETRON 4 MG PO TBDP: 4.0000 mg | ORAL_TABLET | Freq: Three times a day (TID) | ORAL | 0 refills | Status: DC | PRN

## 2018-02-07 NOTE — ED Triage Notes (Signed)
Pt finished her dialysis treatment today, states she got home and started to have n/v/d with a headache.

## 2018-02-07 NOTE — ED Provider Notes (Signed)
Emergency Department Provider Note   I have reviewed the triage vital signs and the nursing notes.   HISTORY  Chief Complaint Emesis   HPI Megan [name] is a 57 y.o. female with PMH of ESRD on HD, HLD, and HTN presents to the emergency department for evaluation of acute onset nausea, vomiting, diarrhea.  The patient was feeling well during dialysis this morning.  She completed her full dialysis treatment and went home without symptoms.  Shortly after arriving home she had the sensation that she needed to have a bowel movement.  At that point she began having profuse non-bloody diarrhea with associated nausea and vomiting.  No sick contacts at home.  No recent travel.  No antibiotics.  Tums were persistent and call the patient to present to the emergency department.  She denies any difficulty breathing. She did have some mild chest pain during HD but has not been persistent. Notes this is relatively common for her.  During the vomiting and diarrhea the patient describes severe left flank pain radiating around to the left inguinal crease.  No vaginal bleeding or discharge.  She states that she rarely makes urine.   Past Medical History:  Diagnosis Date  . Anemia   . CKD (chronic kidney disease) stage 3, GFR 30-59 ml/min (HCC)   . Essential hypertension, benign   . GERD (gastroesophageal reflux disease)   . Hemorrhoids   . Mixed hyperlipidemia   . PONV (postoperative nausea and vomiting)   . Renal insufficiency   . S/P colonoscopy Jan 2011   Dr. Benson Norway: sessile polyp (benign lymphoid), large hemorrhoids, repeat 5-10 years  . Type 2 diabetes mellitus [facility])     Patient Active Problem List   Diagnosis Date Noted  . Rectal bleeding 06/08/2016  . Vaginal discharge 12/30/2015  . Abdominal pain, epigastric 11/26/2013  . GERD (gastroesophageal reflux disease)   . Type 2 diabetes mellitus ([place])   . CKD (chronic kidney disease) stage 3, GFR 30-59 ml/min (HCC)   . External hemorrhoid  08/01/2011  . Hematochezia 07/13/2011  . Atypical chest pain 04/21/2010  . Mixed hyperlipidemia 03/11/2008  . OBESITY 03/11/2008  . Essential hypertension, benign 03/11/2008    Past Surgical History:  Procedure Laterality Date  . ABDOMINAL HYSTERECTOMY    . APPENDECTOMY    . CATARACT EXTRACTION W/PHACO Left 02/09/2017   Procedure: CATARACT EXTRACTION PHACO AND INTRAOCULAR LENS PLACEMENT LEFT EYE;  Surgeon: Tonny Branch, MD;  Location: [facility];  Service: Ophthalmology;  Laterality: Left;  CDE: 4.89  . CATARACT EXTRACTION W/PHACO Right 06/04/2017   Procedure: CATARACT EXTRACTION PHACO AND INTRAOCULAR LENS PLACEMENT (IOC);  Surgeon: Tonny Branch, MD;  Location: [facility];  Service: Ophthalmology;  Laterality: Right;  CDE: 4.12  . CHOLECYSTECTOMY  09/29/2011   Procedure: LAPAROSCOPIC CHOLECYSTECTOMY;  Surgeon: Jamesetta So, MD;  Location: [facility];  Service: General;  Laterality: N/A;  . COLONOSCOPY  Jan 2011   Dr. Benson Norway: sessile polyp (benign lymphoid), large hemorrhoids, repeat 5-10 years  . COLONOSCOPY N/A 06/12/2016   Procedure: COLONOSCOPY;  Surgeon: Daneil Dolin, MD;  Location: [facility];  Service: Endoscopy;  Laterality: N/A;  1230   . ESOPHAGOGASTRODUODENOSCOPY  09/05/2011   JSE:GBTDV hiatal hernia; remainder of exam normal. No explanation for patient's abdominal pain with today's examination  . ESOPHAGOGASTRODUODENOSCOPY N/A 12/17/2013   Dr. [name]: gastric erythema, erosion, mild chronic inflammation on path   . LAPAROSCOPIC APPENDECTOMY  09/29/2011   Procedure: APPENDECTOMY LAPAROSCOPIC;  Surgeon: Jamesetta So, MD;  Location: [facility];  Service: General;;  incidental appendectomy    Allergies Patient has no known allergies.  Family History  Problem Relation Age of Onset  . Hypertension Mother   . Coronary artery disease Mother   . Diabetes Mother   . Hypertension Sister   . Coronary artery disease Sister   . Hypertension Brother   . Colon cancer Neg Hx     Social  History Social History   Tobacco Use  . Smoking status: Never Smoker  . Smokeless tobacco: Never Used  Substance Use Topics  . Alcohol use: No  . Drug use: No    Review of Systems  Constitutional: No fever/chills Eyes: No visual changes. ENT: No sore throat. Cardiovascular: Positive chest pain (resolved).  Respiratory: Denies shortness of breath. Gastrointestinal: No abdominal pain.  No nausea, no vomiting.  No diarrhea.  No constipation. Genitourinary: Negative for dysuria. Musculoskeletal: Negative for back pain. Skin: Negative for rash. Neurological: Negative for headaches, focal weakness or numbness.  10-point ROS otherwise negative.  ____________________________________________   PHYSICAL EXAM:  VITAL SIGNS: ED Triage Vitals [02/07/18 1111]  Enc Vitals Group     BP 128/85     Pulse Rate 84     Resp 16     Temp 98.1 F (36.7 C)     Temp Source Oral     SpO2 96 %     Weight 172 lb (78 kg)     Height 5\' 2"  (1.575 m)     Pain Score 6   Constitutional: Alert and oriented. Well appearing and in no acute distress. Eyes: Conjunctivae are normal.  Head: Atraumatic. Nose: No congestion/rhinnorhea. Mouth/Throat: Mucous membranes are moist.  Neck: No stridor. Cardiovascular: Normal rate, regular rhythm. Good peripheral circulation. Grossly normal heart sounds.   Respiratory: Normal respiratory effort.  No retractions. Lungs CTAB. Gastrointestinal: Soft and nontender. No distention.  Musculoskeletal: No lower extremity tenderness nor edema. No gross deformities of extremities. Neurologic:  Normal speech and language. No gross focal neurologic deficits are appreciated.  Skin:  Skin is warm, dry and intact. No rash noted.  ____________________________________________   LABS (all labs ordered are listed, but only abnormal results are displayed)  Labs Reviewed  COMPREHENSIVE METABOLIC PANEL - Abnormal; Notable for the following components:      Result Value    Potassium 2.9 (*)    Chloride 95 (*)    Glucose, Bld 144 (*)    Creatinine, Ser 5.26 (*)    Calcium 8.3 (*)    Total Protein 8.6 (*)    GFR calc non Af Amer 8 (*)    GFR calc Af Amer 10 (*)    All other components within normal limits  CBC - Abnormal; Notable for the following components:   RBC 3.59 (*)    MCV 107.2 (*)    MCH 35.1 (*)    RDW 15.9 (*)    All other components within normal limits  LIPASE, BLOOD  TROPONIN I   ____________________________________________  RADIOLOGY  Ct Renal Stone Study  Result Date: 02/07/2018 CLINICAL DATA:  Flank pain EXAM: CT ABDOMEN AND PELVIS WITHOUT CONTRAST TECHNIQUE: Multidetector CT imaging of the abdomen and pelvis was performed following the standard protocol without IV contrast. COMPARISON:  CT abdomen pelvis 10/03/2016 FINDINGS: LOWER CHEST: No basilar pulmonary nodules or pleural effusion. No apical pericardial effusion. HEPATOBILIARY: Normal hepatic contours and density. No intra- or extrahepatic biliary dilatation. Status post cholecystectomy. PANCREAS: Normal parenchymal contours without ductal dilatation. No peripancreatic fluid  collection. SPLEEN: Normal. ADRENALS/URINARY TRACT: --Adrenal glands: Normal. --Right kidney/ureter: Moderate renal atrophy, progressed from the prior study. No hydronephrosis or nephrolithiasis. --Left kidney/ureter: Moderate renal atrophy, progressed from the prior study. No hydronephrosis or nephrolithiasis. --Urinary bladder: Normal for degree of distention STOMACH/BOWEL: --Stomach/Duodenum: No hiatal hernia or other gastric abnormality. Normal duodenal course. --Small bowel: No dilatation or inflammation. --Colon: No focal abnormality. --Appendix: Surgically absent. VASCULAR/LYMPHATIC: Atherosclerotic calcification is present within the non-aneurysmal abdominal aorta, without hemodynamically significant stenosis. No abdominal or pelvic lymphadenopathy. REPRODUCTIVE: Status post hysterectomy. No adnexal mass.  MUSCULOSKELETAL. No bony spinal canal stenosis or focal osseous abnormality. OTHER: None. IMPRESSION: 1. No acute abnormality of the abdomen or pelvis. 2. Marked progression of bilateral renal atrophy compared to 10/03/2016. Correlation with laboratory values is recommended. 3.  Aortic Atherosclerosis (ICD10-I70.0). Electronically Signed   By: Ulyses Jarred M.D.   On: 02/07/2018 15:34     EKG:    EKG Interpretation  Date/Time:  Thursday February 07 2018 12:11:56 EDT Ventricular Rate:  75 PR Interval:    QRS Duration: 93 QT Interval:  440 QTC Calculation: 492 R Axis:   33 Text Interpretation:  Sinus rhythm Left ventricular hypertrophy Borderline prolonged QT interval No STEMI. Similar to prior.  Confirmed by Nanda Quinton [id number]) on 02/07/2018 12:14:58 PM       ____________________________________________   PROCEDURES  Procedure(s) performed:   Procedures  None ____________________________________________   INITIAL IMPRESSION / ASSESSMENT AND PLAN / ED COURSE  Pertinent labs & imaging results that were available during my care of the patient were reviewed by me and considered in my medical decision making (see chart for details).  Patient presents to the emergency department with acute onset nausea, vomiting, diarrhea.  She had some associated left flank pain which seems to be improving.  No significant tenderness on abdominal exam.  She had a normal run of dialysis this morning.  Very low suspicion for atypical ACS presentation.  Suspect viral etiology.  Plan for CT renal protocol to evaluate for possible ureterolithiasis.  Pain well controlled now.  No evidence to suggest sepsis. No IVF given HD today and risk of volume overload. BP normal here.   03:30 PM CT scan reviewed with no acute findings.  Labs show baseline electrolytes and renal function.  Patient had full run of hemodialysis today.  She is feeling much better after Zofran.  Will prescribe Zofran and Imodium to use at  home as needed.  No additional abdominal or flank pain here.  Plan for close PCP and nephrology follow-up.   At this time, I do not feel there is any life-threatening condition present. I have reviewed and discussed all results (EKG, imaging, lab, urine as appropriate), exam findings with patient. I have reviewed nursing notes and appropriate previous records.  I feel the patient is safe to be discharged home without further emergent workup. Discussed usual and customary return precautions. Patient and family (if present) verbalize understanding and are comfortable with this plan.  Patient will follow-up with their primary care provider. If they do not have a primary care provider, information for follow-up has been provided to them. All questions have been answered.  ____________________________________________  FINAL CLINICAL IMPRESSION(S) / ED DIAGNOSES  Final diagnoses:  Nausea vomiting and diarrhea     MEDICATIONS GIVEN DURING THIS VISIT:  Medications  ondansetron (ZOFRAN) injection 4 mg (4 mg Intravenous Given 02/07/18 1151)  ketorolac (TORADOL) 30 MG/ML injection 15 mg (15 mg Intravenous Given 02/07/18 1152)  NEW OUTPATIENT MEDICATIONS STARTED DURING THIS VISIT:  New Prescriptions   LOPERAMIDE (IMODIUM) 2 MG CAPSULE    Take 1 capsule (2 mg total) by mouth 4 (four) times daily as needed for diarrhea or loose stools.   ONDANSETRON (ZOFRAN ODT) 4 MG DISINTEGRATING TABLET    Take 1 tablet (4 mg total) by mouth every 8 (eight) hours as needed for nausea or vomiting.    Note:  This document was prepared using Dragon voice recognition software and may include unintentional dictation errors.  Nanda Quinton, MD Emergency Medicine    Jammy Stlouis, [facility], MD 02/07/18 [phone]

## 2018-02-07 NOTE — Discharge Instructions (Signed)

## 2018-02-09 DIAGNOSIS — E875 Hyperkalemia: Secondary | ICD-10-CM | POA: Diagnosis not present

## 2018-02-09 DIAGNOSIS — D631 Anemia in chronic kidney disease: Secondary | ICD-10-CM | POA: Diagnosis not present

## 2018-02-09 DIAGNOSIS — N2581 Secondary hyperparathyroidism of renal origin: Secondary | ICD-10-CM | POA: Diagnosis not present

## 2018-02-09 DIAGNOSIS — N186 End stage renal disease: Secondary | ICD-10-CM | POA: Diagnosis not present

## 2018-02-11 DIAGNOSIS — N040 Nephrotic syndrome with minor glomerular abnormality: Secondary | ICD-10-CM | POA: Diagnosis not present

## 2018-02-11 DIAGNOSIS — Z992 Dependence on renal dialysis: Secondary | ICD-10-CM | POA: Diagnosis not present

## 2018-02-11 DIAGNOSIS — N186 End stage renal disease: Secondary | ICD-10-CM | POA: Diagnosis not present

## 2018-02-12 DIAGNOSIS — E875 Hyperkalemia: Secondary | ICD-10-CM | POA: Diagnosis not present

## 2018-02-12 DIAGNOSIS — N186 End stage renal disease: Secondary | ICD-10-CM | POA: Diagnosis not present

## 2018-02-12 DIAGNOSIS — D509 Iron deficiency anemia, unspecified: Secondary | ICD-10-CM | POA: Diagnosis not present

## 2018-02-12 DIAGNOSIS — N2581 Secondary hyperparathyroidism of renal origin: Secondary | ICD-10-CM | POA: Diagnosis not present

## 2018-02-12 DIAGNOSIS — D631 Anemia in chronic kidney disease: Secondary | ICD-10-CM | POA: Diagnosis not present

## 2018-02-14 DIAGNOSIS — D631 Anemia in chronic kidney disease: Secondary | ICD-10-CM | POA: Diagnosis not present

## 2018-02-14 DIAGNOSIS — N2581 Secondary hyperparathyroidism of renal origin: Secondary | ICD-10-CM | POA: Diagnosis not present

## 2018-02-14 DIAGNOSIS — E875 Hyperkalemia: Secondary | ICD-10-CM | POA: Diagnosis not present

## 2018-02-14 DIAGNOSIS — D509 Iron deficiency anemia, unspecified: Secondary | ICD-10-CM | POA: Diagnosis not present

## 2018-02-14 DIAGNOSIS — N186 End stage renal disease: Secondary | ICD-10-CM | POA: Diagnosis not present

## 2018-02-16 DIAGNOSIS — D631 Anemia in chronic kidney disease: Secondary | ICD-10-CM | POA: Diagnosis not present

## 2018-02-16 DIAGNOSIS — D509 Iron deficiency anemia, unspecified: Secondary | ICD-10-CM | POA: Diagnosis not present

## 2018-02-16 DIAGNOSIS — N186 End stage renal disease: Secondary | ICD-10-CM | POA: Diagnosis not present

## 2018-02-16 DIAGNOSIS — N2581 Secondary hyperparathyroidism of renal origin: Secondary | ICD-10-CM | POA: Diagnosis not present

## 2018-02-16 DIAGNOSIS — E875 Hyperkalemia: Secondary | ICD-10-CM | POA: Diagnosis not present

## 2018-02-18 ENCOUNTER — Other Ambulatory Visit: Payer: Self-pay | Admitting: Family Medicine

## 2018-02-18 ENCOUNTER — Encounter (HOSPITAL_COMMUNITY): Payer: Self-pay

## 2018-02-18 ENCOUNTER — Ambulatory Visit (HOSPITAL_COMMUNITY)
Admission: RE | Admit: 2018-02-18 | Discharge: 2018-02-18 | Disposition: A | Discharge status: Home / Self Care | Payer: Medicare Other | Source: Ambulatory Visit | Attending: Family Medicine | Admitting: Family Medicine

## 2018-02-18 DIAGNOSIS — R928 Other abnormal and inconclusive findings on diagnostic imaging of breast: Secondary | ICD-10-CM

## 2018-02-18 DIAGNOSIS — Z1231 Encounter for screening mammogram for malignant neoplasm of breast: Secondary | ICD-10-CM

## 2018-02-19 DIAGNOSIS — D509 Iron deficiency anemia, unspecified: Secondary | ICD-10-CM | POA: Diagnosis not present

## 2018-02-19 DIAGNOSIS — N2581 Secondary hyperparathyroidism of renal origin: Secondary | ICD-10-CM | POA: Diagnosis not present

## 2018-02-19 DIAGNOSIS — E875 Hyperkalemia: Secondary | ICD-10-CM | POA: Diagnosis not present

## 2018-02-19 DIAGNOSIS — N186 End stage renal disease: Secondary | ICD-10-CM | POA: Diagnosis not present

## 2018-02-19 DIAGNOSIS — D631 Anemia in chronic kidney disease: Secondary | ICD-10-CM | POA: Diagnosis not present

## 2018-02-21 DIAGNOSIS — D509 Iron deficiency anemia, unspecified: Secondary | ICD-10-CM | POA: Diagnosis not present

## 2018-02-21 DIAGNOSIS — N2581 Secondary hyperparathyroidism of renal origin: Secondary | ICD-10-CM | POA: Diagnosis not present

## 2018-02-21 DIAGNOSIS — N186 End stage renal disease: Secondary | ICD-10-CM | POA: Diagnosis not present

## 2018-02-21 DIAGNOSIS — D631 Anemia in chronic kidney disease: Secondary | ICD-10-CM | POA: Diagnosis not present

## 2018-02-21 DIAGNOSIS — E875 Hyperkalemia: Secondary | ICD-10-CM | POA: Diagnosis not present

## 2018-02-22 ENCOUNTER — Encounter: Payer: Self-pay | Admitting: Family Medicine

## 2018-02-22 ENCOUNTER — Ambulatory Visit (INDEPENDENT_AMBULATORY_CARE_PROVIDER_SITE_OTHER): Payer: Medicare Other | Admitting: Family Medicine

## 2018-02-22 VITALS — BP 134/78 | HR 76 | Temp 97.5°F | Resp 16 | Ht 62.0 in | Wt 176.0 lb

## 2018-02-22 DIAGNOSIS — M542 Cervicalgia: Secondary | ICD-10-CM

## 2018-02-22 MED ORDER — TIZANIDINE HCL 4 MG PO TABS: 4.0000 mg | ORAL_TABLET | Freq: Four times a day (QID) | ORAL | 0 refills | Status: DC | PRN

## 2018-02-22 NOTE — Progress Notes (Signed)
Subjective:    Patient ID: Megan [name], female    DOB: 18-Feb-1961, 57 y.o.   MRN: [id number]  HPI  Patient reports pain on both sides of her neck x8 months.  The pain is located over the trapezius just above the right clavicle mainly.  She is tender to palpation in that area.  She states the pain is also present on the left side of her neck though not as severe.  She originally saw orthopedics who performed an x-ray of the neck and saw no significant abnormalities per her report.  She was then referred to physical therapy and the patient states that physical therapy provided no benefit.  According to the patient, the orthopedist tried her on gabapentin without benefit, they also suggested "shots in her neck" however the patient declined this.  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  She denies any falls or injuries.  She has had occasional episodes of pain radiate from her neck all the way down her right arm into her right hand.  These occur sporadically.  The last occurrence has been more than 2 months ago.  However the pain in the trapezius muscle is constant every day. Past Medical History:  Diagnosis Date  . Anemia   . CKD (chronic kidney disease) stage 3, GFR 30-59 ml/min (HCC)   . Essential hypertension, benign   . GERD (gastroesophageal reflux disease)   . Hemorrhoids   . Mixed hyperlipidemia   . PONV (postoperative nausea and vomiting)   . Renal insufficiency   . S/P colonoscopy Jan 2011   Dr. Benson Norway: sessile polyp (benign lymphoid), large hemorrhoids, repeat 5-10 years  . Type 2 diabetes mellitus ([place])    Past Surgical History:  Procedure Laterality Date  . ABDOMINAL HYSTERECTOMY    . APPENDECTOMY    . CATARACT EXTRACTION W/PHACO Left 02/09/2017   Procedure: CATARACT EXTRACTION PHACO AND INTRAOCULAR LENS PLACEMENT LEFT EYE;  Surgeon: Tonny Branch, MD;  Location: [facility];  Service: Ophthalmology;  Laterality: Left;  CDE: 4.89  . CATARACT  EXTRACTION W/PHACO Right 06/04/2017   Procedure: CATARACT EXTRACTION PHACO AND INTRAOCULAR LENS PLACEMENT (IOC);  Surgeon: Tonny Branch, MD;  Location: [facility];  Service: Ophthalmology;  Laterality: Right;  CDE: 4.12  . CHOLECYSTECTOMY  09/29/2011   Procedure: LAPAROSCOPIC CHOLECYSTECTOMY;  Surgeon: Jamesetta So, MD;  Location: [facility];  Service: General;  Laterality: N/A;  . COLONOSCOPY  Jan 2011   Dr. Benson Norway: sessile polyp (benign lymphoid), large hemorrhoids, repeat 5-10 years  . COLONOSCOPY N/A 06/12/2016   Procedure: COLONOSCOPY;  Surgeon: Daneil Dolin, MD;  Location: [facility];  Service: Endoscopy;  Laterality: N/A;  1230   . ESOPHAGOGASTRODUODENOSCOPY  09/05/2011   NGE:[id number] hiatal hernia; remainder of exam normal. No explanation for patient's abdominal pain with today's examination  . ESOPHAGOGASTRODUODENOSCOPY N/A 12/17/2013   Dr. [name]: gastric erythema, erosion, mild chronic inflammation on path   . LAPAROSCOPIC APPENDECTOMY  09/29/2011   Procedure: APPENDECTOMY LAPAROSCOPIC;  Surgeon: Jamesetta So, MD;  Location: [facility];  Service: General;;  incidental appendectomy   Current Outpatient Medications on File Prior to Visit  Medication Sig Dispense Refill  . aspirin EC 81 MG tablet Take 81 mg by mouth daily.      Marland Kitchen atorvastatin (LIPITOR) 20 MG tablet TAKE 1 TABLET DAILY 90 tablet 1  . BYSTOLIC 10 MG tablet TAKE 1 TABLET TWICE A DAY (Patient taking differently: Take 10 mg by mouth twice daily)  180 tablet 3  . cinacalcet (SENSIPAR) 30 MG tablet Take 30 mg by mouth daily.    . cloNIDine (CATAPRES) 0.1 MG tablet Take 0.05 mg by mouth 2 (two) times daily.     . ferric citrate (AURYXIA) 1 GM 210 MG(Fe) tablet Take 420 mg by mouth 3 (three) times daily with meals.    . folic acid-vitamin b complex-vitamin c-selenium-zinc (DIALYVITE) 3 MG TABS tablet Take 1 tablet by mouth daily.    Marland Kitchen loperamide (IMODIUM) 2 MG capsule Take 1 capsule (2 mg total) by mouth 4 (four) times daily as needed for  diarrhea or loose stools. 12 capsule 0  . losartan (COZAAR) 100 MG tablet TAKE 1 TABLET DAILY (Patient taking differently: Take 50 mg by mouth at bedtime) 90 tablet 3  . Multiple Vitamin (DAILY VITES PO) Take by mouth.    . ondansetron (ZOFRAN ODT) 4 MG disintegrating tablet Take 1 tablet (4 mg total) by mouth every 8 (eight) hours as needed for nausea or vomiting. 20 tablet 0  . triamcinolone cream (KENALOG) 0.1 % Apply 1 application topically 2 (two) times daily. 30 g 0   No current facility-administered medications on file prior to visit.    No Known Allergies Social History   Socioeconomic History  . Marital status: Married    Spouse name: Not on file  . Number of children: Not on file  . Years of education: Not on file  . Highest education level: Not on file  Occupational History  . 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 Financial resource strain: Not on file  . Food insecurity:    Worry: Not on file    Inability: Not on file  . Transportation needs:    Medical: Not on file    Non-medical: Not on file  Tobacco Use  . Smoking status: Never Smoker  . Smokeless tobacco: Never Used  Substance and Sexual Activity  . Alcohol use: No  . Drug use: No  . Sexual activity: Yes    Birth control/protection: Surgical  Lifestyle  . Physical activity:    Days per week: Not on file    Minutes per session: Not on file  . Stress: Not on file  Relationships  . Social connections:    Talks on phone: Not on file    Gets together: Not on file    Attends religious service: Not on file    Active member of club or organization: Not on file    Attends meetings of clubs or organizations: Not on file    Relationship status: Not on file  . Intimate partner violence:    Fear of current or ex partner: Not on file    Emotionally abused: Not on file    Physically abused: Not on file    Forced sexual activity: Not on file  Other Topics Concern  . Not on file    Social History Narrative   Works at [employer] in [place].    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   Also has to get items from high shelves-causes achy pain in shoulder area      Married.   Children are grown, out of house.     Review of Systems  All other systems reviewed and are negative.      Objective:   Physical Exam  Constitutional: She appears well-developed and well-nourished. No distress.  HENT:  Right Ear: Tympanic  membrane, external ear and ear canal normal.  Left Ear: Tympanic membrane, external ear and ear canal normal.  Nose: Nose normal.  Mouth/Throat: Oropharynx is clear and moist. No oropharyngeal exudate.  Eyes: Conjunctivae are normal.  Neck: Neck supple.  Cardiovascular: Normal rate, regular rhythm and normal heart sounds.  Pulmonary/Chest: Effort normal and breath sounds normal. No respiratory distress. She has no wheezes. She has no rales.  Musculoskeletal:       Cervical back: She exhibits tenderness and pain. She exhibits normal range of motion, no bony tenderness, no swelling, no edema, no deformity and no spasm.       Back:  Lymphadenopathy:    She has no cervical adenopathy.  Skin: She is not diaphoretic.  Vitals reviewed.         Assessment & Plan:  Neck pain Differential diagnosis includes chronic muscle pain in the neck versus cervical radiculopathy versus fibromyalgia.  Patient has trialed and failed gabapentin as well as physical therapy under the care of an orthopedist.  Reportedly x-rays of the cervical spine were normal.  I have recommended a trial of muscle relaxers because I believe the majority of her pain is muscle pain.  I recommended Zanaflex every 6-8 hours as needed for pain and also applying heat when necessary for pain.  If neck pain is not improving at that point, I would recommend an MRI to evaluate for degenerative disc disease or cervical radiculopathy.  If no improvement or pathology is seen on the MRI, the  next step then would be a referral to a pain clinic for possible trigger point injections/Botox injections depending upon the recommendation.  We could also consider Cymbalta for possible fibromyalgia given her relatively benign exam.

## 2018-02-23 DIAGNOSIS — N186 End stage renal disease: Secondary | ICD-10-CM | POA: Diagnosis not present

## 2018-02-23 DIAGNOSIS — E875 Hyperkalemia: Secondary | ICD-10-CM | POA: Diagnosis not present

## 2018-02-23 DIAGNOSIS — N2581 Secondary hyperparathyroidism of renal origin: Secondary | ICD-10-CM | POA: Diagnosis not present

## 2018-02-23 DIAGNOSIS — D631 Anemia in chronic kidney disease: Secondary | ICD-10-CM | POA: Diagnosis not present

## 2018-02-23 DIAGNOSIS — D509 Iron deficiency anemia, unspecified: Secondary | ICD-10-CM | POA: Diagnosis not present

## 2018-02-26 ENCOUNTER — Encounter (HOSPITAL_COMMUNITY): Payer: Self-pay

## 2018-02-26 ENCOUNTER — Ambulatory Visit (HOSPITAL_COMMUNITY)
Admission: RE | Admit: 2018-02-26 | Discharge: 2018-02-26 | Disposition: A | Discharge status: Home / Self Care | Payer: Medicare Other | Source: Ambulatory Visit | Attending: Family Medicine | Admitting: Family Medicine

## 2018-02-26 ENCOUNTER — Other Ambulatory Visit: Payer: Self-pay | Admitting: Family Medicine

## 2018-02-26 DIAGNOSIS — R928 Other abnormal and inconclusive findings on diagnostic imaging of breast: Secondary | ICD-10-CM

## 2018-02-26 DIAGNOSIS — N2581 Secondary hyperparathyroidism of renal origin: Secondary | ICD-10-CM | POA: Diagnosis not present

## 2018-02-26 DIAGNOSIS — D509 Iron deficiency anemia, unspecified: Secondary | ICD-10-CM | POA: Diagnosis not present

## 2018-02-26 DIAGNOSIS — N186 End stage renal disease: Secondary | ICD-10-CM | POA: Diagnosis not present

## 2018-02-26 DIAGNOSIS — R922 Inconclusive mammogram: Secondary | ICD-10-CM | POA: Diagnosis not present

## 2018-02-26 DIAGNOSIS — D631 Anemia in chronic kidney disease: Secondary | ICD-10-CM | POA: Diagnosis not present

## 2018-02-26 DIAGNOSIS — N6489 Other specified disorders of breast: Secondary | ICD-10-CM | POA: Diagnosis not present

## 2018-02-26 DIAGNOSIS — E875 Hyperkalemia: Secondary | ICD-10-CM | POA: Diagnosis not present

## 2018-02-28 DIAGNOSIS — D631 Anemia in chronic kidney disease: Secondary | ICD-10-CM | POA: Diagnosis not present

## 2018-02-28 DIAGNOSIS — N2581 Secondary hyperparathyroidism of renal origin: Secondary | ICD-10-CM | POA: Diagnosis not present

## 2018-02-28 DIAGNOSIS — N186 End stage renal disease: Secondary | ICD-10-CM | POA: Diagnosis not present

## 2018-02-28 DIAGNOSIS — E1129 Type 2 diabetes mellitus with other diabetic kidney complication: Secondary | ICD-10-CM | POA: Diagnosis not present

## 2018-02-28 DIAGNOSIS — E875 Hyperkalemia: Secondary | ICD-10-CM | POA: Diagnosis not present

## 2018-02-28 DIAGNOSIS — E039 Hypothyroidism, unspecified: Secondary | ICD-10-CM | POA: Diagnosis not present

## 2018-02-28 DIAGNOSIS — E119 Type 2 diabetes mellitus without complications: Secondary | ICD-10-CM | POA: Diagnosis not present

## 2018-02-28 DIAGNOSIS — D509 Iron deficiency anemia, unspecified: Secondary | ICD-10-CM | POA: Diagnosis not present

## 2018-03-02 DIAGNOSIS — D509 Iron deficiency anemia, unspecified: Secondary | ICD-10-CM | POA: Diagnosis not present

## 2018-03-02 DIAGNOSIS — N186 End stage renal disease: Secondary | ICD-10-CM | POA: Diagnosis not present

## 2018-03-02 DIAGNOSIS — D631 Anemia in chronic kidney disease: Secondary | ICD-10-CM | POA: Diagnosis not present

## 2018-03-02 DIAGNOSIS — E875 Hyperkalemia: Secondary | ICD-10-CM | POA: Diagnosis not present

## 2018-03-02 DIAGNOSIS — N2581 Secondary hyperparathyroidism of renal origin: Secondary | ICD-10-CM | POA: Diagnosis not present

## 2018-03-05 ENCOUNTER — Other Ambulatory Visit: Payer: Self-pay | Admitting: Family Medicine

## 2018-03-05 ENCOUNTER — Other Ambulatory Visit: Payer: Self-pay | Admitting: Diagnostic Radiology

## 2018-03-05 ENCOUNTER — Ambulatory Visit (HOSPITAL_COMMUNITY)
Admission: RE | Admit: 2018-03-05 | Discharge: 2018-03-05 | Disposition: A | Discharge status: Home / Self Care | Payer: Medicare Other | Source: Ambulatory Visit | Attending: Family Medicine | Admitting: Family Medicine

## 2018-03-05 ENCOUNTER — Ambulatory Visit (HOSPITAL_COMMUNITY): Payer: Medicare Other

## 2018-03-05 ENCOUNTER — Encounter (HOSPITAL_COMMUNITY): Payer: Self-pay

## 2018-03-05 DIAGNOSIS — R928 Other abnormal and inconclusive findings on diagnostic imaging of breast: Secondary | ICD-10-CM

## 2018-03-05 DIAGNOSIS — D509 Iron deficiency anemia, unspecified: Secondary | ICD-10-CM | POA: Diagnosis not present

## 2018-03-05 DIAGNOSIS — N186 End stage renal disease: Secondary | ICD-10-CM | POA: Diagnosis not present

## 2018-03-05 DIAGNOSIS — N6310 Unspecified lump in the right breast, unspecified quadrant: Secondary | ICD-10-CM | POA: Diagnosis present

## 2018-03-05 DIAGNOSIS — N6312 Unspecified lump in the right breast, upper inner quadrant: Secondary | ICD-10-CM | POA: Diagnosis not present

## 2018-03-05 DIAGNOSIS — D631 Anemia in chronic kidney disease: Secondary | ICD-10-CM | POA: Diagnosis not present

## 2018-03-05 DIAGNOSIS — N6031 Fibrosclerosis of right breast: Secondary | ICD-10-CM | POA: Diagnosis not present

## 2018-03-05 DIAGNOSIS — N2581 Secondary hyperparathyroidism of renal origin: Secondary | ICD-10-CM | POA: Diagnosis not present

## 2018-03-05 DIAGNOSIS — E875 Hyperkalemia: Secondary | ICD-10-CM | POA: Diagnosis not present

## 2018-03-05 MED ORDER — LIDOCAINE HCL (PF) 1 % IJ SOLN
INTRAMUSCULAR | Status: AC
  Administered 2018-03-05: 5 mL
  Filled 2018-03-05: qty 5

## 2018-03-05 MED ORDER — LIDOCAINE-EPINEPHRINE (PF) 1 %-1:200000 IJ SOLN
INTRAMUSCULAR | Status: AC
  Administered 2018-03-05: 3 mL
  Filled 2018-03-05: qty 30

## 2018-03-07 DIAGNOSIS — D631 Anemia in chronic kidney disease: Secondary | ICD-10-CM | POA: Diagnosis not present

## 2018-03-07 DIAGNOSIS — E875 Hyperkalemia: Secondary | ICD-10-CM | POA: Diagnosis not present

## 2018-03-07 DIAGNOSIS — D509 Iron deficiency anemia, unspecified: Secondary | ICD-10-CM | POA: Diagnosis not present

## 2018-03-07 DIAGNOSIS — N2581 Secondary hyperparathyroidism of renal origin: Secondary | ICD-10-CM | POA: Diagnosis not present

## 2018-03-07 DIAGNOSIS — N186 End stage renal disease: Secondary | ICD-10-CM | POA: Diagnosis not present

## 2018-03-09 DIAGNOSIS — D631 Anemia in chronic kidney disease: Secondary | ICD-10-CM | POA: Diagnosis not present

## 2018-03-09 DIAGNOSIS — D509 Iron deficiency anemia, unspecified: Secondary | ICD-10-CM | POA: Diagnosis not present

## 2018-03-09 DIAGNOSIS — N186 End stage renal disease: Secondary | ICD-10-CM | POA: Diagnosis not present

## 2018-03-09 DIAGNOSIS — E875 Hyperkalemia: Secondary | ICD-10-CM | POA: Diagnosis not present

## 2018-03-09 DIAGNOSIS — N2581 Secondary hyperparathyroidism of renal origin: Secondary | ICD-10-CM | POA: Diagnosis not present

## 2018-03-12 DIAGNOSIS — E875 Hyperkalemia: Secondary | ICD-10-CM | POA: Diagnosis not present

## 2018-03-12 DIAGNOSIS — D631 Anemia in chronic kidney disease: Secondary | ICD-10-CM | POA: Diagnosis not present

## 2018-03-12 DIAGNOSIS — N186 End stage renal disease: Secondary | ICD-10-CM | POA: Diagnosis not present

## 2018-03-12 DIAGNOSIS — N2581 Secondary hyperparathyroidism of renal origin: Secondary | ICD-10-CM | POA: Diagnosis not present

## 2018-03-12 DIAGNOSIS — D509 Iron deficiency anemia, unspecified: Secondary | ICD-10-CM | POA: Diagnosis not present

## 2018-03-14 DIAGNOSIS — D509 Iron deficiency anemia, unspecified: Secondary | ICD-10-CM | POA: Diagnosis not present

## 2018-03-14 DIAGNOSIS — N186 End stage renal disease: Secondary | ICD-10-CM | POA: Diagnosis not present

## 2018-03-14 DIAGNOSIS — N040 Nephrotic syndrome with minor glomerular abnormality: Secondary | ICD-10-CM | POA: Diagnosis not present

## 2018-03-14 DIAGNOSIS — Z992 Dependence on renal dialysis: Secondary | ICD-10-CM | POA: Diagnosis not present

## 2018-03-14 DIAGNOSIS — N2581 Secondary hyperparathyroidism of renal origin: Secondary | ICD-10-CM | POA: Diagnosis not present

## 2018-03-14 DIAGNOSIS — E875 Hyperkalemia: Secondary | ICD-10-CM | POA: Diagnosis not present

## 2018-03-16 DIAGNOSIS — E875 Hyperkalemia: Secondary | ICD-10-CM | POA: Diagnosis not present

## 2018-03-16 DIAGNOSIS — D509 Iron deficiency anemia, unspecified: Secondary | ICD-10-CM | POA: Diagnosis not present

## 2018-03-16 DIAGNOSIS — N186 End stage renal disease: Secondary | ICD-10-CM | POA: Diagnosis not present

## 2018-03-16 DIAGNOSIS — N2581 Secondary hyperparathyroidism of renal origin: Secondary | ICD-10-CM | POA: Diagnosis not present

## 2018-03-19 DIAGNOSIS — N2581 Secondary hyperparathyroidism of renal origin: Secondary | ICD-10-CM | POA: Diagnosis not present

## 2018-03-19 DIAGNOSIS — E875 Hyperkalemia: Secondary | ICD-10-CM | POA: Diagnosis not present

## 2018-03-19 DIAGNOSIS — N186 End stage renal disease: Secondary | ICD-10-CM | POA: Diagnosis not present

## 2018-03-19 DIAGNOSIS — D509 Iron deficiency anemia, unspecified: Secondary | ICD-10-CM | POA: Diagnosis not present

## 2018-03-21 DIAGNOSIS — N2581 Secondary hyperparathyroidism of renal origin: Secondary | ICD-10-CM | POA: Diagnosis not present

## 2018-03-21 DIAGNOSIS — N186 End stage renal disease: Secondary | ICD-10-CM | POA: Diagnosis not present

## 2018-03-21 DIAGNOSIS — D509 Iron deficiency anemia, unspecified: Secondary | ICD-10-CM | POA: Diagnosis not present

## 2018-03-21 DIAGNOSIS — E875 Hyperkalemia: Secondary | ICD-10-CM | POA: Diagnosis not present

## 2018-03-23 DIAGNOSIS — E875 Hyperkalemia: Secondary | ICD-10-CM | POA: Diagnosis not present

## 2018-03-23 DIAGNOSIS — D509 Iron deficiency anemia, unspecified: Secondary | ICD-10-CM | POA: Diagnosis not present

## 2018-03-23 DIAGNOSIS — N186 End stage renal disease: Secondary | ICD-10-CM | POA: Diagnosis not present

## 2018-03-23 DIAGNOSIS — N2581 Secondary hyperparathyroidism of renal origin: Secondary | ICD-10-CM | POA: Diagnosis not present

## 2018-03-26 DIAGNOSIS — D509 Iron deficiency anemia, unspecified: Secondary | ICD-10-CM | POA: Diagnosis not present

## 2018-03-26 DIAGNOSIS — N186 End stage renal disease: Secondary | ICD-10-CM | POA: Diagnosis not present

## 2018-03-26 DIAGNOSIS — E875 Hyperkalemia: Secondary | ICD-10-CM | POA: Diagnosis not present

## 2018-03-26 DIAGNOSIS — N2581 Secondary hyperparathyroidism of renal origin: Secondary | ICD-10-CM | POA: Diagnosis not present

## 2018-03-28 ENCOUNTER — Other Ambulatory Visit: Payer: Self-pay | Admitting: Family Medicine

## 2018-03-28 DIAGNOSIS — N186 End stage renal disease: Secondary | ICD-10-CM | POA: Diagnosis not present

## 2018-03-28 DIAGNOSIS — D509 Iron deficiency anemia, unspecified: Secondary | ICD-10-CM | POA: Diagnosis not present

## 2018-03-28 DIAGNOSIS — E875 Hyperkalemia: Secondary | ICD-10-CM | POA: Diagnosis not present

## 2018-03-28 DIAGNOSIS — N2581 Secondary hyperparathyroidism of renal origin: Secondary | ICD-10-CM | POA: Diagnosis not present

## 2018-03-28 MED ORDER — LOSARTAN POTASSIUM 50 MG PO TABS: 50.0000 mg | ORAL_TABLET | Freq: Every day | ORAL | 3 refills | Status: DC

## 2018-03-30 DIAGNOSIS — E875 Hyperkalemia: Secondary | ICD-10-CM | POA: Diagnosis not present

## 2018-03-30 DIAGNOSIS — D509 Iron deficiency anemia, unspecified: Secondary | ICD-10-CM | POA: Diagnosis not present

## 2018-03-30 DIAGNOSIS — N2581 Secondary hyperparathyroidism of renal origin: Secondary | ICD-10-CM | POA: Diagnosis not present

## 2018-03-30 DIAGNOSIS — N186 End stage renal disease: Secondary | ICD-10-CM | POA: Diagnosis not present

## 2018-04-02 DIAGNOSIS — N186 End stage renal disease: Secondary | ICD-10-CM | POA: Diagnosis not present

## 2018-04-02 DIAGNOSIS — N2581 Secondary hyperparathyroidism of renal origin: Secondary | ICD-10-CM | POA: Diagnosis not present

## 2018-04-02 DIAGNOSIS — E875 Hyperkalemia: Secondary | ICD-10-CM | POA: Diagnosis not present

## 2018-04-02 DIAGNOSIS — D509 Iron deficiency anemia, unspecified: Secondary | ICD-10-CM | POA: Diagnosis not present

## 2018-04-04 DIAGNOSIS — D509 Iron deficiency anemia, unspecified: Secondary | ICD-10-CM | POA: Diagnosis not present

## 2018-04-04 DIAGNOSIS — E875 Hyperkalemia: Secondary | ICD-10-CM | POA: Diagnosis not present

## 2018-04-04 DIAGNOSIS — N2581 Secondary hyperparathyroidism of renal origin: Secondary | ICD-10-CM | POA: Diagnosis not present

## 2018-04-04 DIAGNOSIS — N186 End stage renal disease: Secondary | ICD-10-CM | POA: Diagnosis not present

## 2018-04-06 DIAGNOSIS — N186 End stage renal disease: Secondary | ICD-10-CM | POA: Diagnosis not present

## 2018-04-06 DIAGNOSIS — E875 Hyperkalemia: Secondary | ICD-10-CM | POA: Diagnosis not present

## 2018-04-06 DIAGNOSIS — D509 Iron deficiency anemia, unspecified: Secondary | ICD-10-CM | POA: Diagnosis not present

## 2018-04-06 DIAGNOSIS — N2581 Secondary hyperparathyroidism of renal origin: Secondary | ICD-10-CM | POA: Diagnosis not present

## 2018-04-09 DIAGNOSIS — D509 Iron deficiency anemia, unspecified: Secondary | ICD-10-CM | POA: Diagnosis not present

## 2018-04-09 DIAGNOSIS — N2581 Secondary hyperparathyroidism of renal origin: Secondary | ICD-10-CM | POA: Diagnosis not present

## 2018-04-09 DIAGNOSIS — E875 Hyperkalemia: Secondary | ICD-10-CM | POA: Diagnosis not present

## 2018-04-09 DIAGNOSIS — N186 End stage renal disease: Secondary | ICD-10-CM | POA: Diagnosis not present

## 2018-04-11 DIAGNOSIS — N186 End stage renal disease: Secondary | ICD-10-CM | POA: Diagnosis not present

## 2018-04-11 DIAGNOSIS — D509 Iron deficiency anemia, unspecified: Secondary | ICD-10-CM | POA: Diagnosis not present

## 2018-04-11 DIAGNOSIS — E875 Hyperkalemia: Secondary | ICD-10-CM | POA: Diagnosis not present

## 2018-04-11 DIAGNOSIS — N2581 Secondary hyperparathyroidism of renal origin: Secondary | ICD-10-CM | POA: Diagnosis not present

## 2018-04-13 DIAGNOSIS — D509 Iron deficiency anemia, unspecified: Secondary | ICD-10-CM | POA: Diagnosis not present

## 2018-04-13 DIAGNOSIS — N2581 Secondary hyperparathyroidism of renal origin: Secondary | ICD-10-CM | POA: Diagnosis not present

## 2018-04-13 DIAGNOSIS — N186 End stage renal disease: Secondary | ICD-10-CM | POA: Diagnosis not present

## 2018-04-13 DIAGNOSIS — E875 Hyperkalemia: Secondary | ICD-10-CM | POA: Diagnosis not present

## 2018-04-14 DIAGNOSIS — N186 End stage renal disease: Secondary | ICD-10-CM | POA: Diagnosis not present

## 2018-04-14 DIAGNOSIS — N040 Nephrotic syndrome with minor glomerular abnormality: Secondary | ICD-10-CM | POA: Diagnosis not present

## 2018-04-14 DIAGNOSIS — Z992 Dependence on renal dialysis: Secondary | ICD-10-CM | POA: Diagnosis not present

## 2018-04-16 DIAGNOSIS — Z23 Encounter for immunization: Secondary | ICD-10-CM | POA: Diagnosis not present

## 2018-04-16 DIAGNOSIS — N186 End stage renal disease: Secondary | ICD-10-CM | POA: Diagnosis not present

## 2018-04-16 DIAGNOSIS — D631 Anemia in chronic kidney disease: Secondary | ICD-10-CM | POA: Diagnosis not present

## 2018-04-16 DIAGNOSIS — N2581 Secondary hyperparathyroidism of renal origin: Secondary | ICD-10-CM | POA: Diagnosis not present

## 2018-04-16 DIAGNOSIS — E875 Hyperkalemia: Secondary | ICD-10-CM | POA: Diagnosis not present

## 2018-04-17 DIAGNOSIS — N186 End stage renal disease: Secondary | ICD-10-CM | POA: Diagnosis not present

## 2018-04-17 DIAGNOSIS — Z23 Encounter for immunization: Secondary | ICD-10-CM | POA: Diagnosis not present

## 2018-04-17 DIAGNOSIS — D631 Anemia in chronic kidney disease: Secondary | ICD-10-CM | POA: Diagnosis not present

## 2018-04-17 DIAGNOSIS — E875 Hyperkalemia: Secondary | ICD-10-CM | POA: Diagnosis not present

## 2018-04-17 DIAGNOSIS — N2581 Secondary hyperparathyroidism of renal origin: Secondary | ICD-10-CM | POA: Diagnosis not present

## 2018-04-19 DIAGNOSIS — Z23 Encounter for immunization: Secondary | ICD-10-CM | POA: Diagnosis not present

## 2018-04-19 DIAGNOSIS — E875 Hyperkalemia: Secondary | ICD-10-CM | POA: Diagnosis not present

## 2018-04-19 DIAGNOSIS — D631 Anemia in chronic kidney disease: Secondary | ICD-10-CM | POA: Diagnosis not present

## 2018-04-19 DIAGNOSIS — N2581 Secondary hyperparathyroidism of renal origin: Secondary | ICD-10-CM | POA: Diagnosis not present

## 2018-04-19 DIAGNOSIS — N186 End stage renal disease: Secondary | ICD-10-CM | POA: Diagnosis not present

## 2018-04-23 ENCOUNTER — Other Ambulatory Visit: Payer: Self-pay

## 2018-04-23 ENCOUNTER — Ambulatory Visit (INDEPENDENT_AMBULATORY_CARE_PROVIDER_SITE_OTHER): Payer: Medicare Other | Admitting: Family Medicine

## 2018-04-23 ENCOUNTER — Encounter: Payer: Self-pay | Admitting: Family Medicine

## 2018-04-23 VITALS — BP 136/74 | HR 70 | Temp 97.7°F | Resp 14 | Ht 62.0 in | Wt 178.0 lb

## 2018-04-23 DIAGNOSIS — R519 Headache, unspecified: Secondary | ICD-10-CM

## 2018-04-23 DIAGNOSIS — N186 End stage renal disease: Secondary | ICD-10-CM | POA: Diagnosis not present

## 2018-04-23 DIAGNOSIS — E875 Hyperkalemia: Secondary | ICD-10-CM | POA: Diagnosis not present

## 2018-04-23 DIAGNOSIS — Z23 Encounter for immunization: Secondary | ICD-10-CM | POA: Diagnosis not present

## 2018-04-23 DIAGNOSIS — D631 Anemia in chronic kidney disease: Secondary | ICD-10-CM | POA: Diagnosis not present

## 2018-04-23 DIAGNOSIS — N2581 Secondary hyperparathyroidism of renal origin: Secondary | ICD-10-CM | POA: Diagnosis not present

## 2018-04-23 DIAGNOSIS — R51 Headache: Secondary | ICD-10-CM

## 2018-04-23 MED ORDER — PREDNISONE 20 MG PO TABS: ORAL_TABLET | ORAL | 0 refills | Status: DC

## 2018-04-23 MED ORDER — PANTOPRAZOLE SODIUM 40 MG PO TBEC: 40.0000 mg | DELAYED_RELEASE_TABLET | Freq: Every day | ORAL | 3 refills | Status: DC

## 2018-04-23 NOTE — Progress Notes (Signed)
Subjective:    Patient ID: Megan [name], female    DOB: 1960/09/20, 57 y.o.   MRN: [id number]  HPI 02/22/18 Patient reports pain on both sides of her neck x8 months.  The pain is located over the trapezius just above the right clavicle mainly.  She is tender to palpation in that area.  She states the pain is also present on the left side of her neck though not as severe.  She originally saw orthopedics who performed an x-ray of the neck and saw no significant abnormalities per her report.  She was then referred to physical therapy and the patient states that physical therapy provided no benefit.  According to the patient, the orthopedist tried her on gabapentin without benefit, they also suggested "shots in her neck" however the patient declined this.  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  She denies any falls or injuries.  She has had occasional episodes of pain radiate from her neck all the way down her right arm into her right hand.  These occur sporadically.  The last occurrence has been more than 2 months ago.  However the pain in the trapezius muscle is constant every day.  At that time, my plan was: Differential diagnosis includes chronic muscle pain in the neck versus cervical radiculopathy versus fibromyalgia.  Patient has trialed and failed gabapentin as well as physical therapy under the care of an orthopedist.  Reportedly x-rays of the cervical spine were normal.  I have recommended a trial of muscle relaxers because I believe the majority of her pain is muscle pain.  I recommended Zanaflex every 6-8 hours as needed for pain and also applying heat when necessary for pain.  If neck pain is not improving at that point, I would recommend an MRI to evaluate for degenerative disc disease or cervical radiculopathy.  If no improvement or pathology is seen on the MRI, the next step then would be a referral to a pain clinic for possible trigger point  injections/Botox injections depending upon the recommendation.  We could also consider Cymbalta for possible fibromyalgia given her relatively benign exam.  04/23/18 Patient presents today with a different complaint.  She reports 2 to 3 weeks of bilateral headache.  The headache is located on both temples.  She describes it as a pulsing severe pain.  Still complains of some mild blurry vision.  Denies any photophobia.  She denies any phonophobia.  She denies any nausea or vomiting.  She denies any head traumas or recent concussion.  Her blood pressure is well controlled at 136/74.  She denies any numbness or tingling anywhere on her body.  She does continue to have neck pain and Zanaflex provided no relief.  She denies any jaw claudication.  She denies any pain with chewing.  Originally on her exam I palpated the TMJ area bilaterally and she states that is where the pain was.  However further on exam, she states is over the temporal artery.  She also reports heartburn unrelieved by Zantac.  Pepcid works but they do not want her to take Pepcid          Past Medical History:  Diagnosis Date  . Anemia   . CKD (chronic kidney disease) stage 3, GFR 30-59 ml/min (HCC)   . Essential hypertension, benign   . GERD (gastroesophageal reflux disease)   . Hemorrhoids   . Mixed hyperlipidemia   . PONV (postoperative nausea and vomiting)   .  Renal insufficiency   . S/P colonoscopy Jan 2011   Dr. Benson Norway: sessile polyp (benign lymphoid), large hemorrhoids, repeat 5-10 years  . Type 2 diabetes mellitus ([place])    Past Surgical History:  Procedure Laterality Date  . ABDOMINAL HYSTERECTOMY    . APPENDECTOMY    . CATARACT EXTRACTION W/PHACO Left 02/09/2017   Procedure: CATARACT EXTRACTION PHACO AND INTRAOCULAR LENS PLACEMENT LEFT EYE;  Surgeon: Tonny Branch, MD;  Location: [facility];  Service: Ophthalmology;  Laterality: Left;  CDE: 4.89  . CATARACT EXTRACTION W/PHACO Right 06/04/2017   Procedure: CATARACT EXTRACTION  PHACO AND INTRAOCULAR LENS PLACEMENT (IOC);  Surgeon: Tonny Branch, MD;  Location: [facility];  Service: Ophthalmology;  Laterality: Right;  CDE: 4.12  . CHOLECYSTECTOMY  09/29/2011   Procedure: LAPAROSCOPIC CHOLECYSTECTOMY;  Surgeon: Jamesetta So, MD;  Location: [facility];  Service: General;  Laterality: N/A;  . COLONOSCOPY  Jan 2011   Dr. Benson Norway: sessile polyp (benign lymphoid), large hemorrhoids, repeat 5-10 years  . COLONOSCOPY N/A 06/12/2016   Procedure: COLONOSCOPY;  Surgeon: Daneil Dolin, MD;  [facility];  Service: Endoscopy;  Laterality: N/A;  1230   . ESOPHAGOGASTRODUODENOSCOPY  09/05/2011   [facility]:[id number] hiatal hernia; remainder of exam normal. No explanation for patient's abdominal pain with today's examination  . ESOPHAGOGASTRODUODENOSCOPY N/A 12/17/2013   Dr. [name]: gastric erythema, erosion, mild chronic inflammation on path   . LAPAROSCOPIC APPENDECTOMY  09/29/2011   Procedure: APPENDECTOMY LAPAROSCOPIC;  Surgeon: Jamesetta So, MD;  Location: [facility];  Service: General;;  incidental appendectomy   Current Outpatient Medications on File Prior to Visit  Medication Sig Dispense Refill  . aspirin EC 81 MG tablet Take 81 mg by mouth daily.      Marland Kitchen atorvastatin (LIPITOR) 20 MG tablet TAKE 1 TABLET DAILY 90 tablet 1  . BYSTOLIC 10 MG tablet TAKE 1 TABLET TWICE A DAY (Patient taking differently: Take 10 mg by mouth twice daily) 180 tablet 3  . cinacalcet (SENSIPAR) 30 MG tablet Take 30 mg by mouth daily.    . cloNIDine (CATAPRES) 0.1 MG tablet Take 0.05 mg by mouth 2 (two) times daily.     . ferric citrate (AURYXIA) 1 GM 210 MG(Fe) tablet Take 420 mg by mouth 3 (three) times daily with meals.    . folic acid-vitamin b complex-vitamin c-selenium-zinc (DIALYVITE) 3 MG TABS tablet Take 1 tablet by mouth daily.    Marland Kitchen loperamide (IMODIUM) 2 MG capsule Take 1 capsule (2 mg total) by mouth 4 (four) times daily as needed for diarrhea or loose stools. 12 capsule 0  . losartan (COZAAR) 50 MG  tablet Take 1 tablet (50 mg total) by mouth daily. 90 tablet 3  . Multiple Vitamin (DAILY VITES PO) Take by mouth.    . ondansetron (ZOFRAN ODT) 4 MG disintegrating tablet Take 1 tablet (4 mg total) by mouth every 8 (eight) hours as needed for nausea or vomiting. 20 tablet 0  . tiZANidine (ZANAFLEX) 4 MG tablet Take 1 tablet (4 mg total) by mouth every 6 (six) hours as needed for muscle spasms. 30 tablet 0  . triamcinolone cream (KENALOG) 0.1 % Apply 1 application topically 2 (two) times daily. 30 g 0   No current facility-administered medications on file prior to visit.    No Known Allergies Social History   Socioeconomic History  . Marital status: Married    Spouse name: Not on file  . Number of children: Not on file  . Years of education: Not  on file  . Highest education level: Not on file  Occupational History  . 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 Financial resource strain: Not on file  . Food insecurity:    Worry: Not on file    Inability: Not on file  . Transportation needs:    Medical: Not on file    Non-medical: Not on file  Tobacco Use  . Smoking status: Never Smoker  . Smokeless tobacco: Never Used  Substance and Sexual Activity  . Alcohol use: No  . Drug use: No  . Sexual activity: Yes    Birth control/protection: Surgical  Lifestyle  . Physical activity:    Days per week: Not on file    Minutes per session: Not on file  . Stress: Not on file  Relationships  . Social connections:    Talks on phone: Not on file    Gets together: Not on file    Attends religious service: Not on file    Active member of club or organization: Not on file    Attends meetings of clubs or organizations: Not on file    Relationship status: Not on file  . Intimate partner violence:    Fear of current or ex partner: Not on file    Emotionally abused: Not on file    Physically abused: Not on file    Forced sexual activity: Not on file  Other  Topics Concern  . Not on file  Social History Narrative   Works at [employer] in [place].    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   Also has to get items from high shelves-causes achy pain in shoulder area      Married.   Children are grown, out of house.     Review of Systems  All other systems reviewed and are negative.      Objective:   Physical Exam  Constitutional: She is oriented to person, place, and time. She appears well-developed and well-nourished. No distress.  HENT:  Head:    Right Ear: Tympanic membrane, external ear and ear canal normal.  Left Ear: Tympanic membrane, external ear and ear canal normal.  Nose: Nose normal.  Mouth/Throat: Oropharynx is clear and moist. No oropharyngeal exudate.  Eyes: Conjunctivae are normal.  Neck: Neck supple.  Cardiovascular: Normal rate, regular rhythm and normal heart sounds.  Pulmonary/Chest: Effort normal and breath sounds normal. No respiratory distress. She has no wheezes. She has no rales.  Lymphadenopathy:    She has no cervical adenopathy.  Neurological: She is alert and oriented to person, place, and time. She displays normal reflexes. No cranial nerve deficit. She exhibits normal muscle tone. Coordination normal.  Skin: She is not diaphoretic.  Vitals reviewed.         Assessment & Plan:  Temporal headache - Plan: Sedimentation rate  Potential diagnosis includes temporal arteritis although the patient's younger than what I would expect for this, tension headache, TMJ, trigeminal neuralgia.  I will start the patient on a prednisone taper pack and obtain a sed rate.  If the sedimentation rate is normal, I would not proceed with a temporal artery biopsy given her age.  If the sedimentation rate is elevated, that would however be the next step.  Meanwhile I will treat the patient empirically with a prednisone taper pack.  If no better, I would switch treatment to muscle relaxers for tension  headaches/TMJ.  Await the results of the sed rate and her response to prednisone.  Discontinue Zantac and replace with Protonix 40 mg p.o. every morning with food for recalcitrant acid reflux

## 2018-04-24 ENCOUNTER — Other Ambulatory Visit: Payer: Self-pay | Admitting: Family Medicine

## 2018-04-24 DIAGNOSIS — M316 Other giant cell arteritis: Secondary | ICD-10-CM

## 2018-04-24 LAB — SEDIMENTATION RATE: Sed Rate: 87 mm/h — ABNORMAL HIGH (ref 0–30)

## 2018-04-25 DIAGNOSIS — N2581 Secondary hyperparathyroidism of renal origin: Secondary | ICD-10-CM | POA: Diagnosis not present

## 2018-04-25 DIAGNOSIS — E875 Hyperkalemia: Secondary | ICD-10-CM | POA: Diagnosis not present

## 2018-04-25 DIAGNOSIS — N186 End stage renal disease: Secondary | ICD-10-CM | POA: Diagnosis not present

## 2018-04-25 DIAGNOSIS — D631 Anemia in chronic kidney disease: Secondary | ICD-10-CM | POA: Diagnosis not present

## 2018-04-25 DIAGNOSIS — Z23 Encounter for immunization: Secondary | ICD-10-CM | POA: Diagnosis not present

## 2018-04-26 ENCOUNTER — Ambulatory Visit: Payer: Self-pay | Admitting: General Surgery

## 2018-04-26 DIAGNOSIS — N189 Chronic kidney disease, unspecified: Secondary | ICD-10-CM | POA: Diagnosis not present

## 2018-04-26 DIAGNOSIS — M316 Other giant cell arteritis: Secondary | ICD-10-CM | POA: Diagnosis not present

## 2018-04-27 DIAGNOSIS — E875 Hyperkalemia: Secondary | ICD-10-CM | POA: Diagnosis not present

## 2018-04-27 DIAGNOSIS — N186 End stage renal disease: Secondary | ICD-10-CM | POA: Diagnosis not present

## 2018-04-27 DIAGNOSIS — N2581 Secondary hyperparathyroidism of renal origin: Secondary | ICD-10-CM | POA: Diagnosis not present

## 2018-04-27 DIAGNOSIS — D631 Anemia in chronic kidney disease: Secondary | ICD-10-CM | POA: Diagnosis not present

## 2018-04-27 DIAGNOSIS — Z23 Encounter for immunization: Secondary | ICD-10-CM | POA: Diagnosis not present

## 2018-04-30 DIAGNOSIS — N186 End stage renal disease: Secondary | ICD-10-CM | POA: Diagnosis not present

## 2018-04-30 DIAGNOSIS — N2581 Secondary hyperparathyroidism of renal origin: Secondary | ICD-10-CM | POA: Diagnosis not present

## 2018-04-30 DIAGNOSIS — D631 Anemia in chronic kidney disease: Secondary | ICD-10-CM | POA: Diagnosis not present

## 2018-04-30 DIAGNOSIS — E875 Hyperkalemia: Secondary | ICD-10-CM | POA: Diagnosis not present

## 2018-04-30 DIAGNOSIS — Z23 Encounter for immunization: Secondary | ICD-10-CM | POA: Diagnosis not present

## 2018-05-02 DIAGNOSIS — Z23 Encounter for immunization: Secondary | ICD-10-CM | POA: Diagnosis not present

## 2018-05-02 DIAGNOSIS — D631 Anemia in chronic kidney disease: Secondary | ICD-10-CM | POA: Diagnosis not present

## 2018-05-02 DIAGNOSIS — N186 End stage renal disease: Secondary | ICD-10-CM | POA: Diagnosis not present

## 2018-05-02 DIAGNOSIS — E875 Hyperkalemia: Secondary | ICD-10-CM | POA: Diagnosis not present

## 2018-05-02 DIAGNOSIS — N2581 Secondary hyperparathyroidism of renal origin: Secondary | ICD-10-CM | POA: Diagnosis not present

## 2018-05-04 DIAGNOSIS — N186 End stage renal disease: Secondary | ICD-10-CM | POA: Diagnosis not present

## 2018-05-04 DIAGNOSIS — E875 Hyperkalemia: Secondary | ICD-10-CM | POA: Diagnosis not present

## 2018-05-04 DIAGNOSIS — Z23 Encounter for immunization: Secondary | ICD-10-CM | POA: Diagnosis not present

## 2018-05-04 DIAGNOSIS — N2581 Secondary hyperparathyroidism of renal origin: Secondary | ICD-10-CM | POA: Diagnosis not present

## 2018-05-04 DIAGNOSIS — D631 Anemia in chronic kidney disease: Secondary | ICD-10-CM | POA: Diagnosis not present

## 2018-05-07 DIAGNOSIS — Z23 Encounter for immunization: Secondary | ICD-10-CM | POA: Diagnosis not present

## 2018-05-07 DIAGNOSIS — E875 Hyperkalemia: Secondary | ICD-10-CM | POA: Diagnosis not present

## 2018-05-07 DIAGNOSIS — N186 End stage renal disease: Secondary | ICD-10-CM | POA: Diagnosis not present

## 2018-05-07 DIAGNOSIS — D631 Anemia in chronic kidney disease: Secondary | ICD-10-CM | POA: Diagnosis not present

## 2018-05-07 DIAGNOSIS — N2581 Secondary hyperparathyroidism of renal origin: Secondary | ICD-10-CM | POA: Diagnosis not present

## 2018-05-08 ENCOUNTER — Encounter (HOSPITAL_COMMUNITY): Payer: Self-pay | Admitting: *Deleted

## 2018-05-08 ENCOUNTER — Other Ambulatory Visit: Payer: Self-pay

## 2018-05-08 DIAGNOSIS — N186 End stage renal disease: Secondary | ICD-10-CM | POA: Diagnosis not present

## 2018-05-08 DIAGNOSIS — N2581 Secondary hyperparathyroidism of renal origin: Secondary | ICD-10-CM | POA: Diagnosis not present

## 2018-05-08 DIAGNOSIS — E875 Hyperkalemia: Secondary | ICD-10-CM | POA: Diagnosis not present

## 2018-05-08 DIAGNOSIS — Z23 Encounter for immunization: Secondary | ICD-10-CM | POA: Diagnosis not present

## 2018-05-08 DIAGNOSIS — D631 Anemia in chronic kidney disease: Secondary | ICD-10-CM | POA: Diagnosis not present

## 2018-05-08 NOTE — Progress Notes (Signed)
Pt denies SOB, chest pain, and being under the care of a cardiologist. Pt denies having a cardiac cath. Pt to bring recent labs on DOS. Pt denies having a chest x ray within the last year. Pt made aware to stop taking vitamins, fish oil and herbal medications. Do not take any NSAIDs ie: Ibuprofen, Advil, Naproxen (Aleve), Motrin, BC and Goody Powder. Pt verbalized understanding of all pre-op instructions. Karoline Caldwell, PA, Anesthesia reviewed pt EKG and stated that it is " okay" ; PA provided no new orders.

## 2018-05-08 NOTE — Progress Notes (Signed)
   05/08/18 1537  OBSTRUCTIVE SLEEP APNEA  Have you ever been diagnosed with sleep apnea through a sleep study? No  Do you snore loudly (loud enough to be heard through closed doors)?  1  Do you often feel tired, fatigued, or sleepy during the daytime (such as falling asleep during driving or talking to someone)? 0  Has anyone observed you stop breathing during your sleep? 1  Do you have, or are you being treated for high blood pressure? 1  BMI more than 35 kg/m2? 0  Age > 50 (1-yes) 1  Neck circumference greater than:Female 16 inches or larger, Female 17inches or larger?  (assess dos)  Female Gender (Yes=1) 0  Obstructive Sleep Apnea Score 4

## 2018-05-08 NOTE — Progress Notes (Signed)
Pt stated that MD instructed her to only take Protonix with food.

## 2018-05-09 ENCOUNTER — Ambulatory Visit (HOSPITAL_COMMUNITY): Payer: Medicare Other

## 2018-05-09 ENCOUNTER — Encounter (HOSPITAL_COMMUNITY)
Admission: RE | Disposition: A | Discharge status: Home / Self Care | Payer: Self-pay | Source: Ambulatory Visit | Attending: General Surgery

## 2018-05-09 ENCOUNTER — Ambulatory Visit (HOSPITAL_COMMUNITY): Payer: Medicare Other | Admitting: Certified Registered"

## 2018-05-09 ENCOUNTER — Encounter (HOSPITAL_COMMUNITY): Payer: Self-pay

## 2018-05-09 ENCOUNTER — Ambulatory Visit (HOSPITAL_COMMUNITY)
Admission: RE | Admit: 2018-05-09 | Discharge: 2018-05-09 | Disposition: A | Discharge status: Home / Self Care | Payer: Medicare Other | Source: Ambulatory Visit | Attending: General Surgery | Admitting: General Surgery

## 2018-05-09 DIAGNOSIS — H539 Unspecified visual disturbance: Secondary | ICD-10-CM | POA: Diagnosis not present

## 2018-05-09 DIAGNOSIS — E1122 Type 2 diabetes mellitus with diabetic chronic kidney disease: Secondary | ICD-10-CM | POA: Insufficient documentation

## 2018-05-09 DIAGNOSIS — H538 Other visual disturbances: Secondary | ICD-10-CM | POA: Diagnosis not present

## 2018-05-09 DIAGNOSIS — I120 Hypertensive chronic kidney disease with stage 5 chronic kidney disease or end stage renal disease: Secondary | ICD-10-CM | POA: Insufficient documentation

## 2018-05-09 DIAGNOSIS — Z823 Family history of stroke: Secondary | ICD-10-CM | POA: Diagnosis not present

## 2018-05-09 DIAGNOSIS — G4489 Other headache syndrome: Secondary | ICD-10-CM | POA: Insufficient documentation

## 2018-05-09 DIAGNOSIS — I700 Atherosclerosis of aorta: Secondary | ICD-10-CM | POA: Diagnosis not present

## 2018-05-09 DIAGNOSIS — Z7982 Long term (current) use of aspirin: Secondary | ICD-10-CM | POA: Insufficient documentation

## 2018-05-09 DIAGNOSIS — D631 Anemia in chronic kidney disease: Secondary | ICD-10-CM | POA: Insufficient documentation

## 2018-05-09 DIAGNOSIS — Z9842 Cataract extraction status, left eye: Secondary | ICD-10-CM | POA: Diagnosis not present

## 2018-05-09 DIAGNOSIS — Z90710 Acquired absence of both cervix and uterus: Secondary | ICD-10-CM | POA: Diagnosis not present

## 2018-05-09 DIAGNOSIS — Z992 Dependence on renal dialysis: Secondary | ICD-10-CM | POA: Insufficient documentation

## 2018-05-09 DIAGNOSIS — Z8249 Family history of ischemic heart disease and other diseases of the circulatory system: Secondary | ICD-10-CM | POA: Insufficient documentation

## 2018-05-09 DIAGNOSIS — R51 Headache: Secondary | ICD-10-CM | POA: Diagnosis not present

## 2018-05-09 DIAGNOSIS — K649 Unspecified hemorrhoids: Secondary | ICD-10-CM | POA: Diagnosis not present

## 2018-05-09 DIAGNOSIS — K219 Gastro-esophageal reflux disease without esophagitis: Secondary | ICD-10-CM | POA: Diagnosis not present

## 2018-05-09 DIAGNOSIS — Z833 Family history of diabetes mellitus: Secondary | ICD-10-CM | POA: Insufficient documentation

## 2018-05-09 DIAGNOSIS — N186 End stage renal disease: Secondary | ICD-10-CM | POA: Diagnosis not present

## 2018-05-09 DIAGNOSIS — Z79899 Other long term (current) drug therapy: Secondary | ICD-10-CM | POA: Insufficient documentation

## 2018-05-09 DIAGNOSIS — M316 Other giant cell arteritis: Secondary | ICD-10-CM | POA: Diagnosis not present

## 2018-05-09 DIAGNOSIS — Z9841 Cataract extraction status, right eye: Secondary | ICD-10-CM | POA: Diagnosis not present

## 2018-05-09 DIAGNOSIS — Z01818 Encounter for other preprocedural examination: Secondary | ICD-10-CM

## 2018-05-09 HISTORY — DX: Other giant cell arteritis: M31.6

## 2018-05-09 HISTORY — DX: Headache, unspecified: R51.9

## 2018-05-09 HISTORY — DX: Presence of spectacles and contact lenses: Z97.3

## 2018-05-09 HISTORY — PX: ARTERY BIOPSY: SHX891

## 2018-05-09 HISTORY — DX: Headache: R51

## 2018-05-09 LAB — SURGICAL PCR SCREEN
MRSA, PCR: NEGATIVE
Staphylococcus aureus: NEGATIVE

## 2018-05-09 LAB — GLUCOSE, CAPILLARY
Glucose-Capillary: 109 mg/dL — ABNORMAL HIGH (ref 70–99)
Glucose-Capillary: 93 mg/dL (ref 70–99)

## 2018-05-09 LAB — POCT I-STAT 4, (NA,K, GLUC, HGB,HCT)
Glucose, Bld: 113 mg/dL — ABNORMAL HIGH (ref 70–99)
HCT: 35 % — ABNORMAL LOW (ref 36.0–46.0)
Hemoglobin: 11.9 g/dL — ABNORMAL LOW (ref 12.0–15.0)
Potassium: 3.6 mmol/L (ref 3.5–5.1)
Sodium: 140 mmol/L (ref 135–145)

## 2018-05-09 SURGERY — BIOPSY TEMPORAL ARTERY
Anesthesia: Monitor Anesthesia Care | Site: Face

## 2018-05-09 MED ORDER — OXYCODONE HCL 5 MG/5ML PO SOLN: 5.0000 mg | Freq: Once | ORAL | Status: DC | PRN

## 2018-05-09 MED ORDER — OXYCODONE HCL 5 MG PO TABS: 5.0000 mg | ORAL_TABLET | Freq: Once | ORAL | Status: DC | PRN

## 2018-05-09 MED ORDER — CHLORHEXIDINE GLUCONATE CLOTH 2 % EX PADS: 6.0000 | MEDICATED_PAD | Freq: Once | CUTANEOUS | Status: DC

## 2018-05-09 MED ORDER — LIDOCAINE 2% (20 MG/ML) 5 ML SYRINGE
INTRAMUSCULAR | Status: AC
  Filled 2018-05-09: qty 5

## 2018-05-09 MED ORDER — LIDOCAINE HCL (PF) 1 % IJ SOLN
INTRAMUSCULAR | Status: AC
  Filled 2018-05-09: qty 30

## 2018-05-09 MED ORDER — 0.9 % SODIUM CHLORIDE (POUR BTL) OPTIME
TOPICAL | Status: DC | PRN
  Administered 2018-05-09: 1000 mL

## 2018-05-09 MED ORDER — MIDAZOLAM HCL 2 MG/2ML IJ SOLN
INTRAMUSCULAR | Status: DC | PRN
  Administered 2018-05-09: 1 mg via INTRAVENOUS

## 2018-05-09 MED ORDER — SODIUM CHLORIDE 0.9 % IV SOLN
INTRAVENOUS | Status: DC
  Administered 2018-05-09: 08:00:00 via INTRAVENOUS

## 2018-05-09 MED ORDER — PROMETHAZINE HCL 25 MG/ML IJ SOLN: 6.2500 mg | INTRAMUSCULAR | Status: DC | PRN

## 2018-05-09 MED ORDER — FENTANYL CITRATE (PF) 100 MCG/2ML IJ SOLN: 25.0000 ug | INTRAMUSCULAR | Status: DC | PRN

## 2018-05-09 MED ORDER — ONDANSETRON HCL 4 MG/2ML IJ SOLN
INTRAMUSCULAR | Status: AC
  Filled 2018-05-09: qty 2

## 2018-05-09 MED ORDER — MUPIROCIN 2 % EX OINT
1.0000 "application " | TOPICAL_OINTMENT | Freq: Once | CUTANEOUS | Status: AC
  Administered 2018-05-09: 1 via TOPICAL
  Filled 2018-05-09: qty 22

## 2018-05-09 MED ORDER — ONDANSETRON HCL 4 MG/2ML IJ SOLN
INTRAMUSCULAR | Status: DC | PRN
  Administered 2018-05-09: 4 mg via INTRAVENOUS

## 2018-05-09 MED ORDER — PROPOFOL 10 MG/ML IV BOLUS
INTRAVENOUS | Status: AC
  Filled 2018-05-09: qty 20

## 2018-05-09 MED ORDER — SODIUM CHLORIDE 0.9 % IV SOLN: INTRAVENOUS | Status: DC

## 2018-05-09 MED ORDER — FENTANYL CITRATE (PF) 250 MCG/5ML IJ SOLN
INTRAMUSCULAR | Status: DC | PRN
  Administered 2018-05-09: 50 ug via INTRAVENOUS

## 2018-05-09 MED ORDER — PROPOFOL 500 MG/50ML IV EMUL
INTRAVENOUS | Status: DC | PRN
  Administered 2018-05-09: 200 ug/kg/min via INTRAVENOUS

## 2018-05-09 MED ORDER — BUPIVACAINE-EPINEPHRINE (PF) 0.5% -1:200000 IJ SOLN
INTRAMUSCULAR | Status: AC
  Filled 2018-05-09: qty 30

## 2018-05-09 MED ORDER — FENTANYL CITRATE (PF) 250 MCG/5ML IJ SOLN
INTRAMUSCULAR | Status: AC
  Filled 2018-05-09: qty 5

## 2018-05-09 MED ORDER — LIDOCAINE HCL 1 % IJ SOLN
INTRAMUSCULAR | Status: DC | PRN
  Administered 2018-05-09: 5 mL

## 2018-05-09 MED ORDER — MIDAZOLAM HCL 2 MG/2ML IJ SOLN
INTRAMUSCULAR | Status: AC
  Filled 2018-05-09: qty 2

## 2018-05-09 MED ORDER — DEXAMETHASONE SODIUM PHOSPHATE 10 MG/ML IJ SOLN
INTRAMUSCULAR | Status: AC
  Filled 2018-05-09: qty 1

## 2018-05-09 SURGICAL SUPPLY — 42 items
ADH SKN CLS APL DERMABOND .7 (GAUZE/BANDAGES/DRESSINGS) ×1
BALL CTTN LRG ABS STRL LF (GAUZE/BANDAGES/DRESSINGS) ×1
CHLORAPREP W/TINT 10.5 ML (MISCELLANEOUS) ×2 IMPLANT
COTTONBALL LRG STERILE PKG (GAUZE/BANDAGES/DRESSINGS) ×2 IMPLANT
COVER SURGICAL LIGHT HANDLE (MISCELLANEOUS) ×2 IMPLANT
CRADLE DONUT ADULT HEAD (MISCELLANEOUS) ×2 IMPLANT
DERMABOND ADVANCED (GAUZE/BANDAGES/DRESSINGS) ×1
DERMABOND ADVANCED .7 DNX12 (GAUZE/BANDAGES/DRESSINGS) ×1 IMPLANT
DRAPE LAPAROTOMY 100X72 PEDS (DRAPES) ×2 IMPLANT
DRAPE UTILITY XL STRL (DRAPES) ×2 IMPLANT
DRSG TELFA 3X8 NADH (GAUZE/BANDAGES/DRESSINGS) ×2 IMPLANT
ELECT NDL TIP 2.8 STRL (NEEDLE) ×1 IMPLANT
ELECT NEEDLE TIP 2.8 STRL (NEEDLE) ×2 IMPLANT
ELECT REM PT RETURN 9FT ADLT (ELECTROSURGICAL) ×2
ELECTRODE REM PT RTRN 9FT ADLT (ELECTROSURGICAL) ×1 IMPLANT
GAUZE 4X4 16PLY RFD (DISPOSABLE) ×2 IMPLANT
GEL ULTRASOUND 20GR AQUASONIC (MISCELLANEOUS) IMPLANT
GLOVE ECLIPSE 7.5 STRL STRAW (GLOVE) ×2 IMPLANT
GOWN STRL REUS W/ TWL LRG LVL3 (GOWN DISPOSABLE) ×2 IMPLANT
GOWN STRL REUS W/TWL LRG LVL3 (GOWN DISPOSABLE) ×4
KIT BASIN OR (CUSTOM PROCEDURE TRAY) ×2 IMPLANT
KIT TURNOVER KIT B (KITS) ×2 IMPLANT
NDL 25GX 5/8IN NON SAFETY (NEEDLE) ×1 IMPLANT
NEEDLE 25GX 5/8IN NON SAFETY (NEEDLE) ×2 IMPLANT
NS IRRIG 1000ML POUR BTL (IV SOLUTION) ×2 IMPLANT
PACK SURGICAL SETUP 50X90 (CUSTOM PROCEDURE TRAY) ×2 IMPLANT
PAD ARMBOARD 7.5X6 YLW CONV (MISCELLANEOUS) ×2 IMPLANT
PAD DRESSING TELFA 3X8 NADH (GAUZE/BANDAGES/DRESSINGS) ×1 IMPLANT
PENCIL BUTTON HOLSTER BLD 10FT (ELECTRODE) ×2 IMPLANT
SPECIMEN JAR SMALL (MISCELLANEOUS) ×2 IMPLANT
STRIP CLOSURE SKIN 1/4X4 (GAUZE/BANDAGES/DRESSINGS) ×2 IMPLANT
SUT MON AB 4-0 PC3 18 (SUTURE) IMPLANT
SUT VIC AB 3-0 54X BRD REEL (SUTURE) ×1 IMPLANT
SUT VIC AB 3-0 BRD 54 (SUTURE) ×2
SUT VIC AB 3-0 SH 27 (SUTURE) ×2
SUT VIC AB 3-0 SH 27X BRD (SUTURE) ×1 IMPLANT
SUT VIC AB 4-0 RB1 27 (SUTURE) ×2
SUT VIC AB 4-0 RB1 27X BRD (SUTURE) ×1 IMPLANT
SYR BULB 3OZ (MISCELLANEOUS) ×2 IMPLANT
SYR CONTROL 10ML LL (SYRINGE) ×2 IMPLANT
TOWEL OR 17X24 6PK STRL BLUE (TOWEL DISPOSABLE) ×2 IMPLANT
TOWEL OR 17X26 10 PK STRL BLUE (TOWEL DISPOSABLE) ×2 IMPLANT

## 2018-05-09 NOTE — H&P (Signed)
Megan [name] Documented: 04/26/2018 3:40 PM Location: [facility] Patient #: [id number] DOB: 1960-08-27 Married / Language: English / Race: Black or African American Female   History of Present Illness Megan [name]. Megan Skains MD; 04/26/2018 4:04 PM) Patient words: BIlateral headaches and temporal pain. Blurred vision. tenderness in head.  The patient is a 57 year old female.   Past Surgical History (Megan [name], CMA; 04/26/2018 4:10 PM) Appendectomy  Cataract Surgery  Bilateral. Cesarean Section - 1  Dialysis Shunt / Fistula  Gallbladder Surgery - Laparoscopic  Hysterectomy (not due to cancer) - Complete   Diagnostic Studies History (Megan [name], CMA; 04/26/2018 4:10 PM) Colonoscopy  1-5 years ago Mammogram  within last year Pap Smear  1-5 years ago  Allergies (Megan [name], [place]; 04/26/2018 3:41 PM) No Known Drug Allergies [04/26/2018]: Allergies Reconciled   Medication History (Megan [name], [place]; 04/26/2018 3:43 PM) Dialyvite 800/Ultra D (Oral) Active. Aspirin (81MG  Tablet, Oral) Active. cloNIDine (0.2MG /24HR Patch Weekly, Transdermal) Active. Bystolic (10MG  Tablet, Oral) Active. Losartan Potassium (100MG  Tablet, Oral) Active. Atorvastatin Calcium (20MG  Tablet, Oral) Active. predniSONE (20MG  Tablet, Oral) Active. Pantoprazole Sodium (40MG  Tablet DR, Oral) Active. tiZANidine HCl (4MG  Tablet, Oral) Active. Gabapentin (100MG  Capsule, Oral) Active. Medications Reconciled  Social History (Megan [name], [place]; 04/26/2018 4:10 PM) Caffeine use  Carbonated beverages. No alcohol use  No drug use  Tobacco use  Never smoker.  Family History (Megan [name], [place]; 04/26/2018 4:10 PM) Cerebrovascular Accident  Father. Diabetes Mellitus  Mother. Heart Disease  Father, Mother. Heart disease in female family member before age 45  Hypertension  Brother, Mother, Sister, Son.  Pregnancy / Birth History (Megan [name], [place]; 04/26/2018 4:10  PM) Age at menarche  11 years. Gravida  2 Irregular periods  Maternal age  66-35 Para  1  Other Problems (Megan [name], CMA; 04/26/2018 4:10 PM) Back Pain  Chronic Renal Failure Syndrome  Gastroesophageal Reflux Disease  General anesthesia - complications  Hemorrhoids  High blood pressure     Review of Systems (Megan [name] CMA; 04/26/2018 4:10 PM) General Not Present- Appetite Loss, Chills, Fatigue, Fever, Night Sweats, Weight Gain and Weight Loss. Skin Not Present- Change in Wart/Mole, Dryness, Hives, Jaundice, New Lesions, Non-Healing Wounds, Rash and Ulcer. HEENT Present- Wears glasses/contact lenses. Not Present- Earache, Hearing Loss, Hoarseness, Nose Bleed, Oral Ulcers, Ringing in the Ears, Seasonal Allergies, Sinus Pain, Sore Throat, Visual Disturbances and Yellow Eyes. Respiratory Not Present- Bloody sputum, Chronic Cough, Difficulty Breathing, Snoring and Wheezing. Breast Not Present- Breast Mass, Breast Pain, Nipple Discharge and Skin Changes. Cardiovascular Not Present- Chest Pain, Difficulty Breathing Lying Down, Leg Cramps, Palpitations, Rapid Heart Rate, Shortness of Breath and Swelling of Extremities. Gastrointestinal Present- Abdominal Pain and Hemorrhoids. Not Present- Bloating, Bloody Stool, Change in Bowel Habits, Chronic diarrhea, Constipation, Difficulty Swallowing, Excessive gas, Gets full quickly at meals, Indigestion, Nausea, Rectal Pain and Vomiting. Musculoskeletal Present- Back Pain. Not Present- Joint Pain, Joint Stiffness, Muscle Pain, Muscle Weakness and Swelling of Extremities. Neurological Present- Headaches. Not Present- Decreased Memory, Fainting, Numbness, Seizures, Tingling, Tremor, Trouble walking and Weakness. Psychiatric Not Present- Anxiety, Bipolar, Change in Sleep Pattern, Depression, Fearful and Frequent crying. Endocrine Not Present- Cold Intolerance, Excessive Hunger, Hair Changes, Heat Intolerance, Hot flashes and New  Diabetes. Hematology Not Present- Blood Thinners, Easy Bruising, Excessive bleeding, Gland problems, HIV and Persistent Infections.  Vitals (Megan [name] RMA; 04/26/2018 3:41 PM) 04/26/2018 3:40 PM Weight: 180.2 lb Height: 62in Body Surface Area: 1.83 m Body Mass Index: 32.96 kg/m  Temp.:  98.79F  Pulse: 91 (Regular)  BP: 134/82 (Sitting, Left Arm, Standard) BP today 137/45  Physical Exam (Megan [name] O. Megan Skains MD; 04/26/2018 4:06 PM) General Mental Status-Alert. General Appearance-Cooperative and Well groomed. Orientation-Oriented X4. Build & Nutrition-Obese(Mildly obese). Health Status-General Health Good.  Head and Neck Head Head Shape - Note: Normal Tender at both temproal artery sites.  Cannot palpate pulse at temporal artery site well  Cardiovascular Cardiovascular examination reveals -on palpation PMI is normal in location and amplitude, no palpable S3 or S4. Normal cardiac borders.. Note: Left forearm dialysis fistula    Assessment & Plan Megan [name]. 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 Headache and temporal pain. Blurred vision. Impression: Will go ahead an schedule temporal artery biopsy Current Plans:  Right temporal artery biopsy  Megan [name]. Megan Bailiff, MD, [place] [phone] [facility]

## 2018-05-09 NOTE — Transfer of Care (Signed)
Immediate Anesthesia Transfer of Care Note  Patient: Megan [name]  Procedure(s) Performed: RIGHT TEMPORAL ARTERY BIOPSY (N/A Face)  Patient Location: PACU  Anesthesia Type:MAC  Level of Consciousness: awake, alert  and oriented  Airway & Oxygen Therapy: Patient Spontanous Breathing and Patient connected to nasal cannula oxygen  Post-op Assessment: Report given to RN and Post -op Vital signs reviewed and stable  Post vital signs: Reviewed and stable  Last Vitals:  Vitals Value Taken Time  BP    Temp    Pulse    Resp    SpO2      Last Pain:  Vitals:   05/09/18 0711  TempSrc:   PainSc: 8       Patients Stated Pain Goal: 2 (92/33/00 7622)  Complications: No apparent anesthesia complications

## 2018-05-09 NOTE — Anesthesia Postprocedure Evaluation (Signed)
Anesthesia Post Note  Patient: Megan [name]  Procedure(s) Performed: RIGHT TEMPORAL ARTERY BIOPSY (N/A Face)     Patient location during evaluation: PACU Anesthesia Type: MAC Level of consciousness: awake and alert Pain management: pain level controlled Vital Signs Assessment: post-procedure vital signs reviewed and stable Respiratory status: spontaneous breathing, nonlabored ventilation, respiratory function stable and patient connected to nasal cannula oxygen Cardiovascular status: stable and blood pressure returned to baseline Postop Assessment: no apparent nausea or vomiting Anesthetic complications: no    Last Vitals:  Vitals:   05/09/18 1042 05/09/18 1045  BP: 127/75   Pulse:    Resp:    Temp:  (!) 36.2 C  SpO2: 100%     Last Pain:  Vitals:   05/09/18 1042  TempSrc:   PainSc: 0-No pain                 Lidia Collum

## 2018-05-09 NOTE — Discharge Instructions (Signed)
Temporal Artery Biopsy, Care After Refer to this sheet in the next few weeks. These instructions provide you with information about caring for yourself after your procedure. Your health care provider may also give you more specific instructions. Your treatment has been planned according to current medical practices, but problems sometimes occur. Call your health care provider if you have any problems or questions after your procedure. What can I expect after the procedure? After the procedure, it is common to have:  Soreness.  Bruising.  Numbness.  Swelling.  Follow these instructions at home: Incision care  Follow instructions from your health care provider about how to take care of the cut made during surgery (incision). Make sure you: ? Wash your hands with soap and water before you change your bandage (dressing). If soap and water are not available, use hand sanitizer. ? Change your dressing as told by your health care provider. ? Leave stitches (sutures), skin glue, or adhesive strips in place. These skin closures may need to stay in place for 2 weeks or longer. If adhesive strip edges start to loosen and curl up, you may trim the loose edges. Do not remove adhesive strips completely unless your health care provider tells you to do that.  Check your incision area every day for signs of infection. Watch for: ? More redness, swelling, or pain. ? More fluid or blood. ? Warmth. ? Pus or a bad smell. Activity  Do not do any physical work or strenuous exercise until your health care provider approves.  Do not lift anything that is heavier than 10 lb (4.5 kg). General instructions   Take over-the-counter and prescription medicines only as told by your health care provider. Do not take aspirin or other NSAIDs unless told by your health care provider. Aspirin may increase your risk of bleeding at the incision site.  Follow your health care provider's instructions on bathing or  showering.  Keep all follow-up visits as told by your health care provider. This is important. Contact a health care provider if:  Medicine does not help your pain.  You have a fever or chills.  You have more redness, swelling, or pain around your incision site.  You have more fluid or blood coming from your incision site.  Your incision feels warm to the touch.  You have pus or a bad smell coming from your incision site.  You have a fever.  You develop nausea or vomiting. Get help right away if:  You have bleeding from the incision that does not stop after 30 minutes of applying heavy pressure.  You have chest pain.  You have shortness of breath.  You faint.  You have sudden vision loss.  You develop weakness or drooping in your face or eye. This information is not intended to replace advice given to you by your health care provider. Make sure you discuss any questions you have with your health care provider.  Kathryne Eriksson. Dahlia Bailiff, MD, [place] [phone] [facility]

## 2018-05-09 NOTE — Anesthesia Procedure Notes (Signed)
Procedure Name: MAC Date/Time: 05/09/2018 9:39 AM Performed by: Imagene Riches, CRNA Pre-anesthesia Checklist: Patient identified, Emergency Drugs available, Suction available and Patient being monitored Patient Re-evaluated:Patient Re-evaluated prior to induction Oxygen Delivery Method: Nasal cannula Preoxygenation: Pre-oxygenation with 100% oxygen

## 2018-05-09 NOTE — Op Note (Signed)
OPERATIVE REPORT  DATE OF OPERATION: 05/09/2018  PATIENT:  Megan [name]  57 y.o. female  PRE-OPERATIVE DIAGNOSIS:  TEMPORAL ARTERITIS  POST-OPERATIVE DIAGNOSIS:  TEMPORAL ARTERITIS  INDICATION(S) FOR OPERATION:  History of visual changes and headaches  FINDINGS:  There were no inflammatory changes in the area of the right temporal artery  PROCEDURE:  Procedure(s): RIGHT TEMPORAL ARTERY BIOPSY  SURGEON:  Surgeon(s): Judeth Horn, MD  ASSISTANT: None  ANESTHESIA:   local and MAC  COMPLICATIONS:  None  EBL: <1 ml  BLOOD ADMINISTERED: none  DRAINS: none   SPECIMEN:  Source of Specimen:  3 cm segment of right temporal artery  COUNTS CORRECT:  YES  PROCEDURE DETAILS: The patient was taken to the operating room and placed on the table initially in the supine position.  After an adequate amount of IV sedation was given, her right temporal area was prepped and draped in usual sterile manner in the preauricular area.  Proper timeout was performed identifying the patient and the procedure to be performed.  The area for the marking of the right and body was marked with a marking pen from palpation and the use of a Doppler.  We subsequently anesthetized the area using 1% Xylocaine and half percent Marcaine with epi longitudinally in front of the ear on the right side.  Using a 30-gauge needle in order to inject local.  Subsequently made a longitudinal incision approximately 3 cm long down to the subcutaneous tissue.  We then used tenotomy scissors, electrocautery, fine-tipped right angle clamp and mosquito clamps in order to dissect out the temporal artery for segment of approximately 3 cm long.  We ligated proximally and distally, and then subsequently excise a segment and sent it off as a specimen.  We irrigated with saline.  We then closed the subcu with interrupted 4-0 Vicryl sutures x2.  Because the skin came together very nicely just closed with Dermabond and Steri-Strips.   All needle counts sponge counts and instrument counts were correct.  PATIENT DISPOSITION:  PACU - hemodynamically stable.   Judeth Horn 9/26/201910:12 AM

## 2018-05-09 NOTE — Anesthesia Preprocedure Evaluation (Addendum)
Anesthesia Evaluation  Patient identified by MRN, date of birth, ID band Patient awake    Reviewed: Allergy & Precautions, NPO status , Patient's Chart, lab work & pertinent test results  History of Anesthesia Complications (+) PONV and history of anesthetic complications  Airway Mallampati: II  TM Distance: >3 FB Neck ROM: Full    Dental  (+) Missing Multiple back lower teeth missing. No loose teeth.:   Pulmonary neg pulmonary ROS,    Pulmonary exam normal breath sounds clear to auscultation       Cardiovascular hypertension, Normal cardiovascular exam Rhythm:Regular Rate:Normal  TTE 05/08/17: - Left ventricle: The cavity size was normal. Wall thickness was   increased in a pattern of moderate LVH. Systolic function was   normal. The estimated ejection fraction was in the range of 60%   to 65%. Diastolic function is abnormal, indeterminant grade. Wall   motion was normal; there were no regional wall motion   abnormalities. - Aortic valve: Mildly calcified annulus. Trileaflet; normal   thickness leaflets. Valve area (VTI): 2.95 cm^2. Valve area   (Vmax): 2.89 cm^2. - Atrial septum: No defect or patent foramen ovale was identified. - Systemic veins: IVC is small, suggesting low RA pressure and   hypovolemia.   Neuro/Psych  Headaches, negative psych ROS   GI/Hepatic Neg liver ROS, GERD  ,  Endo/Other  diabetes, Well Controlled  Renal/GU DialysisRenal disease (last dialysis yesterday)  negative genitourinary   Musculoskeletal negative musculoskeletal ROS (+)   Abdominal   Peds  Hematology  (+) anemia ,   Anesthesia Other Findings   Reproductive/Obstetrics                           Anesthesia Physical Anesthesia Plan  ASA: III  Anesthesia Plan: MAC   Post-op Pain Management:    Induction:   PONV Risk Score and Plan: 3 and Propofol infusion and Ondansetron  Airway Management  Planned: Natural Airway, Nasal Cannula and Simple Face Mask  Additional Equipment:   Intra-op Plan:   Post-operative Plan:   Informed Consent: I have reviewed the patients History and Physical, chart, labs and discussed the procedure including the risks, benefits and alternatives for the proposed anesthesia with the patient or authorized representative who has indicated his/her understanding and acceptance.     Plan Discussed with:   Anesthesia Plan Comments:       Anesthesia Quick Evaluation

## 2018-05-10 ENCOUNTER — Encounter (HOSPITAL_COMMUNITY): Payer: Self-pay | Admitting: General Surgery

## 2018-05-11 DIAGNOSIS — D631 Anemia in chronic kidney disease: Secondary | ICD-10-CM | POA: Diagnosis not present

## 2018-05-11 DIAGNOSIS — N186 End stage renal disease: Secondary | ICD-10-CM | POA: Diagnosis not present

## 2018-05-11 DIAGNOSIS — Z23 Encounter for immunization: Secondary | ICD-10-CM | POA: Diagnosis not present

## 2018-05-11 DIAGNOSIS — E875 Hyperkalemia: Secondary | ICD-10-CM | POA: Diagnosis not present

## 2018-05-11 DIAGNOSIS — N2581 Secondary hyperparathyroidism of renal origin: Secondary | ICD-10-CM | POA: Diagnosis not present

## 2018-05-14 DIAGNOSIS — N186 End stage renal disease: Secondary | ICD-10-CM | POA: Diagnosis not present

## 2018-05-14 DIAGNOSIS — G4452 New daily persistent headache (NDPH): Secondary | ICD-10-CM | POA: Diagnosis not present

## 2018-05-14 DIAGNOSIS — E119 Type 2 diabetes mellitus without complications: Secondary | ICD-10-CM | POA: Diagnosis not present

## 2018-05-14 DIAGNOSIS — R51 Headache: Secondary | ICD-10-CM | POA: Diagnosis not present

## 2018-05-14 DIAGNOSIS — N2581 Secondary hyperparathyroidism of renal origin: Secondary | ICD-10-CM | POA: Diagnosis not present

## 2018-05-14 DIAGNOSIS — H532 Diplopia: Secondary | ICD-10-CM | POA: Diagnosis not present

## 2018-05-14 DIAGNOSIS — Z992 Dependence on renal dialysis: Secondary | ICD-10-CM | POA: Diagnosis not present

## 2018-05-14 DIAGNOSIS — E1129 Type 2 diabetes mellitus with other diabetic kidney complication: Secondary | ICD-10-CM | POA: Diagnosis not present

## 2018-05-14 DIAGNOSIS — D631 Anemia in chronic kidney disease: Secondary | ICD-10-CM | POA: Diagnosis not present

## 2018-05-14 DIAGNOSIS — N040 Nephrotic syndrome with minor glomerular abnormality: Secondary | ICD-10-CM | POA: Diagnosis not present

## 2018-05-16 ENCOUNTER — Encounter: Payer: Self-pay | Admitting: Family Medicine

## 2018-05-16 ENCOUNTER — Ambulatory Visit (INDEPENDENT_AMBULATORY_CARE_PROVIDER_SITE_OTHER): Payer: Medicare Other | Admitting: Family Medicine

## 2018-05-16 VITALS — BP 112/80 | HR 78 | Temp 97.9°F | Resp 16 | Ht 62.0 in | Wt 175.0 lb

## 2018-05-16 DIAGNOSIS — H532 Diplopia: Secondary | ICD-10-CM | POA: Diagnosis not present

## 2018-05-16 DIAGNOSIS — E1129 Type 2 diabetes mellitus with other diabetic kidney complication: Secondary | ICD-10-CM | POA: Diagnosis not present

## 2018-05-16 DIAGNOSIS — N186 End stage renal disease: Secondary | ICD-10-CM | POA: Diagnosis not present

## 2018-05-16 DIAGNOSIS — R51 Headache: Secondary | ICD-10-CM

## 2018-05-16 DIAGNOSIS — R519 Headache, unspecified: Secondary | ICD-10-CM

## 2018-05-16 DIAGNOSIS — D631 Anemia in chronic kidney disease: Secondary | ICD-10-CM | POA: Diagnosis not present

## 2018-05-16 DIAGNOSIS — G4452 New daily persistent headache (NDPH): Secondary | ICD-10-CM

## 2018-05-16 DIAGNOSIS — E119 Type 2 diabetes mellitus without complications: Secondary | ICD-10-CM | POA: Diagnosis not present

## 2018-05-16 DIAGNOSIS — N2581 Secondary hyperparathyroidism of renal origin: Secondary | ICD-10-CM | POA: Diagnosis not present

## 2018-05-16 MED ORDER — TOPIRAMATE 25 MG PO TABS: 50.0000 mg | ORAL_TABLET | Freq: Two times a day (BID) | ORAL | 2 refills | Status: DC

## 2018-05-16 NOTE — Progress Notes (Signed)
Subjective:    Patient ID: Megan [name], female    DOB: 11/18/1960, 57 y.o.   MRN: [id number]  HPI 02/22/18 Patient reports pain on both sides of her neck x8 months.  The pain is located over the trapezius just above the right clavicle mainly.  She is tender to palpation in that area.  She states the pain is also present on the left side of her neck though not as severe.  She originally saw orthopedics who performed an x-ray of the neck and saw no significant abnormalities per her report.  She was then referred to physical therapy and the patient states that physical therapy provided no benefit.  According to the patient, the orthopedist tried her on gabapentin without benefit, they also suggested "shots in her neck" however the patient declined this.  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  She denies any falls or injuries.  She has had occasional episodes of pain radiate from her neck all the way down her right arm into her right hand.  These occur sporadically.  The last occurrence has been more than 2 months ago.  However the pain in the trapezius muscle is constant every day.  At that time, my plan was: Differential diagnosis includes chronic muscle pain in the neck versus cervical radiculopathy versus fibromyalgia.  Patient has trialed and failed gabapentin as well as physical therapy under the care of an orthopedist.  Reportedly x-rays of the cervical spine were normal.  I have recommended a trial of muscle relaxers because I believe the majority of her pain is muscle pain.  I recommended Zanaflex every 6-8 hours as needed for pain and also applying heat when necessary for pain.  If neck pain is not improving at that point, I would recommend an MRI to evaluate for degenerative disc disease or cervical radiculopathy.  If no improvement or pathology is seen on the MRI, the next step then would be a referral to a pain clinic for possible trigger point  injections/Botox injections depending upon the recommendation.  We could also consider Cymbalta for possible fibromyalgia given her relatively benign exam.  04/23/18 Patient presents today with a different complaint.  She reports 2 to 3 weeks of bilateral headache.  The headache is located on both temples.  She describes it as a pulsing severe pain.  Still complains of some mild blurry vision.  Denies any photophobia.  She denies any phonophobia.  She denies any nausea or vomiting.  She denies any head traumas or recent concussion.  Her blood pressure is well controlled at 136/74.  She denies any numbness or tingling anywhere on her body.  She does continue to have neck pain and Zanaflex provided no relief.  She denies any jaw claudication.  She denies any pain with chewing.  Originally on her exam I palpated the TMJ area bilaterally and she states that is where the pain was.  However further on exam, she states is over the temporal artery.  She also reports heartburn unrelieved by Zantac.  Pepcid works but they do not want her to take Pepcid.  At that time, my plan was: Potential diagnosis includes temporal arteritis although the patient's younger than what I would expect for this, tension headache, TMJ, trigeminal neuralgia.  I will start the patient on a prednisone taper pack and obtain a sed rate.  If the sedimentation rate is normal, I would not proceed with a temporal artery biopsy given her age.  If the sedimentation rate is elevated, that would however be the next step.  Meanwhile I will treat the patient empirically with a prednisone taper pack.  If no better, I would switch treatment to muscle relaxers for tension headaches/TMJ.  Await the results of the sed rate and her response to prednisone.  Discontinue Zantac and replace with Protonix 40 mg p.o. every morning with food for recalcitrant acid reflux  05/16/18 Patient sedimentation rate was elevated at 87.  Patient ultimately underwent temporal  artery biopsy to rule out temporal arteritis.  Biopsy revealed no evidence of temporal arteritis and she saw no improvement with the prednisone taper pack for her headache.  She continues to endorse bilateral temporal headache on a daily basis.  It has been constant now for over 6 weeks.  Zanaflex does not help the pain but only makes her sleepy.  She also reports diplopia.  She denies any vomiting but she does report nausea with a headache.  She denies any altered mental status.  She denies any other neurologic deficit.  She denies any confusion.  Headache is nonpulsatile.  There is no photophobia or phonophobia area there is no aura.  She did have migraines as a child. Past Medical History:  Diagnosis Date  . Anemia   . CKD (chronic kidney disease) stage 3, GFR 30-59 ml/min (HCC)   . Essential hypertension, benign   . GERD (gastroesophageal reflux disease)   . Headache   . Hemorrhoids   . Mixed hyperlipidemia   . PONV (postoperative nausea and vomiting)   . Renal insufficiency   . S/P colonoscopy Jan 2011   Dr. Benson Norway: sessile polyp (benign lymphoid), large hemorrhoids, repeat 5-10 years  . Temporal arteritis ([place])   . Type 2 diabetes mellitus ([place])   . Wears glasses    Past Surgical History:  Procedure Laterality Date  . ABDOMINAL HYSTERECTOMY    . APPENDECTOMY    . ARTERY BIOPSY N/A 05/09/2018   Procedure: RIGHT TEMPORAL ARTERY BIOPSY;  Surgeon: Judeth Horn, MD;  Location: [place];  Service: General;  Laterality: N/A;  . CATARACT EXTRACTION W/PHACO Left 02/09/2017   Procedure: CATARACT EXTRACTION PHACO AND INTRAOCULAR LENS PLACEMENT LEFT EYE;  Surgeon: Tonny Branch, MD;  Location: [facility];  Service: Ophthalmology;  Laterality: Left;  CDE: 4.89  . CATARACT EXTRACTION W/PHACO Right 06/04/2017   Procedure: CATARACT EXTRACTION PHACO AND INTRAOCULAR LENS PLACEMENT (IOC);  Surgeon: Tonny Branch, MD;  Location: [facility];  Service: Ophthalmology;  Laterality: Right;  CDE: 4.12  . CHOLECYSTECTOMY   09/29/2011   Procedure: LAPAROSCOPIC CHOLECYSTECTOMY;  Surgeon: Jamesetta So, MD;  Location: [facility];  Service: General;  Laterality: N/A;  . COLONOSCOPY  Jan 2011   Dr. Benson Norway: sessile polyp (benign lymphoid), large hemorrhoids, repeat 5-10 years  . COLONOSCOPY N/A 06/12/2016   Procedure: COLONOSCOPY;  Surgeon: Daneil Dolin, MD;  Location: [facility];  Service: Endoscopy;  Laterality: N/A;  1230   . ESOPHAGOGASTRODUODENOSCOPY  09/05/2011   JJK:[id number] hiatal hernia; remainder of exam normal. No explanation for patient's abdominal pain with today's examination  . ESOPHAGOGASTRODUODENOSCOPY N/A 12/17/2013   Dr. [name]: gastric erythema, erosion, mild chronic inflammation on path   . LAPAROSCOPIC APPENDECTOMY  09/29/2011   Procedure: APPENDECTOMY LAPAROSCOPIC;  Surgeon: Jamesetta So, MD;  Location: [facility];  Service: General;;  incidental appendectomy   Current Outpatient Medications on File Prior to Visit  Medication Sig Dispense Refill  . acetaminophen (TYLENOL) 500 MG tablet Take 1,000 mg by mouth  every 6 (six) hours as needed.    Marland Kitchen atorvastatin (LIPITOR) 20 MG tablet TAKE 1 TABLET DAILY 90 tablet 1  . BYSTOLIC 10 MG tablet TAKE 1 TABLET TWICE A DAY (Patient taking differently: Take 10 mg by mouth twice daily) 180 tablet 3  . cinacalcet (SENSIPAR) 30 MG tablet Take 60 mg by mouth daily.     . cloNIDine (CATAPRES) 0.1 MG tablet Take 0.05 mg by mouth 2 (two) times daily.     . ferric citrate (AURYXIA) 1 GM 210 MG(Fe) tablet Take 210-630 mg by mouth See admin instructions. 3 tablets with meals, and 1 tablet with snacks    . folic acid-vitamin b complex-vitamin c-selenium-zinc (DIALYVITE) 3 MG TABS tablet Take 1 tablet by mouth daily.    Marland Kitchen losartan (COZAAR) 50 MG tablet Take 1 tablet (50 mg total) by mouth daily. 90 tablet 3  . pantoprazole (PROTONIX) 40 MG tablet Take 1 tablet (40 mg total) by mouth daily. 30 tablet 3  . tiZANidine (ZANAFLEX) 4 MG tablet Take 1 tablet (4 mg total) by mouth every  6 (six) hours as needed for muscle spasms. 30 tablet 0  . gabapentin (NEURONTIN) 100 MG capsule Take 100 mg by mouth 3 (three) times daily as needed (pain).      No current facility-administered medications on file prior to visit.    No Known Allergies Social History   Socioeconomic History  . Marital status: Married    Spouse name: Not on file  . Number of children: Not on file  . Years of education: Not on file  . Highest education level: Not on file  Occupational History  . Occupation: Merchandiser, retail: [phone]  Social Needs  . Financial resource strain: Not on file  . Food insecurity:    Worry: Not on file    Inability: Not on file  . Transportation needs:    Medical: Not on file    Non-medical: Not on file  Tobacco Use  . Smoking status: Never Smoker  . Smokeless tobacco: Never Used  Substance and Sexual Activity  . Alcohol use: No  . Drug use: No  . Sexual activity: Yes    Birth control/protection: Surgical  Lifestyle  . Physical activity:    Days per week: Not on file    Minutes per session: Not on file  . Stress: Not on file  Relationships  . Social connections:    Talks on phone: Not on file    Gets together: Not on file    Attends religious service: Not on file    Active member of club or organization: Not on file    Attends meetings of clubs or organizations: Not on file    Relationship status: Not on file  . Intimate partner violence:    Fear of current or ex partner: Not on file    Emotionally abused: Not on file    Physically abused: Not on file    Forced sexual activity: Not on file  Other Topics Concern  . Not on file  Social History Narrative   Works at [employer] in [place].    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   Also has to get items from high shelves-causes achy pain in shoulder area      Married.   Children are grown, out of house.     Review of Systems  All other systems reviewed and are  negative.  Objective:   Physical Exam  Constitutional: She is oriented to person, place, and time. She appears well-developed and well-nourished. No distress.  HENT:  Head:    Right Ear: Tympanic membrane, external ear and ear canal normal.  Left Ear: Tympanic membrane, external ear and ear canal normal.  Nose: Nose normal.  Mouth/Throat: Oropharynx is clear and moist. No oropharyngeal exudate.  Eyes: Conjunctivae are normal.  Neck: Neck supple.  Cardiovascular: Normal rate, regular rhythm and normal heart sounds.  Pulmonary/Chest: Effort normal and breath sounds normal. No respiratory distress. She has no wheezes. She has no rales.  Lymphadenopathy:    She has no cervical adenopathy.  Neurological: She is alert and oriented to person, place, and time. She displays normal reflexes. No cranial nerve deficit. She exhibits normal muscle tone. Coordination normal.  Skin: She is not diaphoretic.  Vitals reviewed.         Assessment & Plan:  Diplopia - Plan: MR Brain W Wo Contrast, CANCELED: CT Head Wo Contrast  Temporal headache - Plan: MR Brain W Wo Contrast, CANCELED: CT Head Wo Contrast  Acute intractable headache, unspecified headache type - Plan: CANCELED: CT Head Wo Contrast  New daily persistent headache - Plan: MR Brain W Wo Contrast  Given the persistent nature of the headache and the neurologic deficit of diplopia, I will proceed with an MRI of the brain to rule out structural lesions that can cause chronic daily headache and diplopia.  Meanwhile start the patient on Topamax 25 mg p.o. nightly and increase by 25 mg weekly until she is on 50 mg p.o. twice daily in an effort to stop the headaches from happening.  Temporal artery biopsy was negative and therefore I believe temporal arteritis has been adequately removed from the differential diagnosis despite elevated sedimentation rate.  TMJ is certainly still in the differential diagnosis however this would not explain  the diplopia.  Atypical migraines also would not explain the diplopia but I think are highly likely.  If Topamax does not help an MRI shows no abnormality, we will consult neurology

## 2018-05-18 DIAGNOSIS — E119 Type 2 diabetes mellitus without complications: Secondary | ICD-10-CM | POA: Diagnosis not present

## 2018-05-18 DIAGNOSIS — E1129 Type 2 diabetes mellitus with other diabetic kidney complication: Secondary | ICD-10-CM | POA: Diagnosis not present

## 2018-05-18 DIAGNOSIS — N186 End stage renal disease: Secondary | ICD-10-CM | POA: Diagnosis not present

## 2018-05-18 DIAGNOSIS — D631 Anemia in chronic kidney disease: Secondary | ICD-10-CM | POA: Diagnosis not present

## 2018-05-18 DIAGNOSIS — N2581 Secondary hyperparathyroidism of renal origin: Secondary | ICD-10-CM | POA: Diagnosis not present

## 2018-05-21 DIAGNOSIS — E1129 Type 2 diabetes mellitus with other diabetic kidney complication: Secondary | ICD-10-CM | POA: Diagnosis not present

## 2018-05-21 DIAGNOSIS — N186 End stage renal disease: Secondary | ICD-10-CM | POA: Diagnosis not present

## 2018-05-21 DIAGNOSIS — N2581 Secondary hyperparathyroidism of renal origin: Secondary | ICD-10-CM | POA: Diagnosis not present

## 2018-05-21 DIAGNOSIS — D631 Anemia in chronic kidney disease: Secondary | ICD-10-CM | POA: Diagnosis not present

## 2018-05-21 DIAGNOSIS — E119 Type 2 diabetes mellitus without complications: Secondary | ICD-10-CM | POA: Diagnosis not present

## 2018-05-23 DIAGNOSIS — N186 End stage renal disease: Secondary | ICD-10-CM | POA: Diagnosis not present

## 2018-05-23 DIAGNOSIS — E1129 Type 2 diabetes mellitus with other diabetic kidney complication: Secondary | ICD-10-CM | POA: Diagnosis not present

## 2018-05-23 DIAGNOSIS — N2581 Secondary hyperparathyroidism of renal origin: Secondary | ICD-10-CM | POA: Diagnosis not present

## 2018-05-23 DIAGNOSIS — E119 Type 2 diabetes mellitus without complications: Secondary | ICD-10-CM | POA: Diagnosis not present

## 2018-05-23 DIAGNOSIS — D631 Anemia in chronic kidney disease: Secondary | ICD-10-CM | POA: Diagnosis not present

## 2018-05-24 DIAGNOSIS — E1129 Type 2 diabetes mellitus with other diabetic kidney complication: Secondary | ICD-10-CM | POA: Diagnosis not present

## 2018-05-24 DIAGNOSIS — N186 End stage renal disease: Secondary | ICD-10-CM | POA: Diagnosis not present

## 2018-05-24 DIAGNOSIS — D631 Anemia in chronic kidney disease: Secondary | ICD-10-CM | POA: Diagnosis not present

## 2018-05-24 DIAGNOSIS — N2581 Secondary hyperparathyroidism of renal origin: Secondary | ICD-10-CM | POA: Diagnosis not present

## 2018-05-24 DIAGNOSIS — E119 Type 2 diabetes mellitus without complications: Secondary | ICD-10-CM | POA: Diagnosis not present

## 2018-05-25 ENCOUNTER — Ambulatory Visit
Admission: RE | Admit: 2018-05-25 | Discharge: 2018-05-25 | Disposition: A | Discharge status: Home / Self Care | Payer: Medicare Other | Source: Ambulatory Visit | Attending: Family Medicine | Admitting: Family Medicine

## 2018-05-25 ENCOUNTER — Other Ambulatory Visit: Payer: Self-pay | Admitting: Family Medicine

## 2018-05-25 DIAGNOSIS — R519 Headache, unspecified: Secondary | ICD-10-CM

## 2018-05-25 DIAGNOSIS — H532 Diplopia: Secondary | ICD-10-CM

## 2018-05-25 DIAGNOSIS — G4452 New daily persistent headache (NDPH): Secondary | ICD-10-CM

## 2018-05-25 DIAGNOSIS — R51 Headache: Secondary | ICD-10-CM

## 2018-05-25 DIAGNOSIS — G44221 Chronic tension-type headache, intractable: Secondary | ICD-10-CM | POA: Diagnosis not present

## 2018-05-28 DIAGNOSIS — N2581 Secondary hyperparathyroidism of renal origin: Secondary | ICD-10-CM | POA: Diagnosis not present

## 2018-05-28 DIAGNOSIS — D631 Anemia in chronic kidney disease: Secondary | ICD-10-CM | POA: Diagnosis not present

## 2018-05-28 DIAGNOSIS — N186 End stage renal disease: Secondary | ICD-10-CM | POA: Diagnosis not present

## 2018-05-28 DIAGNOSIS — E119 Type 2 diabetes mellitus without complications: Secondary | ICD-10-CM | POA: Diagnosis not present

## 2018-05-28 DIAGNOSIS — E1129 Type 2 diabetes mellitus with other diabetic kidney complication: Secondary | ICD-10-CM | POA: Diagnosis not present

## 2018-05-30 ENCOUNTER — Other Ambulatory Visit (HOSPITAL_COMMUNITY): Payer: Medicare Other

## 2018-05-30 DIAGNOSIS — E039 Hypothyroidism, unspecified: Secondary | ICD-10-CM | POA: Diagnosis not present

## 2018-05-30 DIAGNOSIS — E1129 Type 2 diabetes mellitus with other diabetic kidney complication: Secondary | ICD-10-CM | POA: Diagnosis not present

## 2018-05-30 DIAGNOSIS — D631 Anemia in chronic kidney disease: Secondary | ICD-10-CM | POA: Diagnosis not present

## 2018-05-30 DIAGNOSIS — N2581 Secondary hyperparathyroidism of renal origin: Secondary | ICD-10-CM | POA: Diagnosis not present

## 2018-05-30 DIAGNOSIS — E119 Type 2 diabetes mellitus without complications: Secondary | ICD-10-CM | POA: Diagnosis not present

## 2018-05-30 DIAGNOSIS — N186 End stage renal disease: Secondary | ICD-10-CM | POA: Diagnosis not present

## 2018-06-01 DIAGNOSIS — D631 Anemia in chronic kidney disease: Secondary | ICD-10-CM | POA: Diagnosis not present

## 2018-06-01 DIAGNOSIS — N186 End stage renal disease: Secondary | ICD-10-CM | POA: Diagnosis not present

## 2018-06-01 DIAGNOSIS — E119 Type 2 diabetes mellitus without complications: Secondary | ICD-10-CM | POA: Diagnosis not present

## 2018-06-01 DIAGNOSIS — N2581 Secondary hyperparathyroidism of renal origin: Secondary | ICD-10-CM | POA: Diagnosis not present

## 2018-06-01 DIAGNOSIS — E1129 Type 2 diabetes mellitus with other diabetic kidney complication: Secondary | ICD-10-CM | POA: Diagnosis not present

## 2018-06-04 ENCOUNTER — Ambulatory Visit (INDEPENDENT_AMBULATORY_CARE_PROVIDER_SITE_OTHER): Payer: Medicare Other | Admitting: Family Medicine

## 2018-06-04 ENCOUNTER — Encounter: Payer: Self-pay | Admitting: Family Medicine

## 2018-06-04 VITALS — BP 138/80 | HR 90 | Temp 97.9°F | Resp 16 | Ht 62.0 in | Wt 175.0 lb

## 2018-06-04 DIAGNOSIS — D631 Anemia in chronic kidney disease: Secondary | ICD-10-CM | POA: Diagnosis not present

## 2018-06-04 DIAGNOSIS — N186 End stage renal disease: Secondary | ICD-10-CM | POA: Diagnosis not present

## 2018-06-04 DIAGNOSIS — E1129 Type 2 diabetes mellitus with other diabetic kidney complication: Secondary | ICD-10-CM | POA: Diagnosis not present

## 2018-06-04 DIAGNOSIS — R51 Headache: Secondary | ICD-10-CM

## 2018-06-04 DIAGNOSIS — R700 Elevated erythrocyte sedimentation rate: Secondary | ICD-10-CM

## 2018-06-04 DIAGNOSIS — E119 Type 2 diabetes mellitus without complications: Secondary | ICD-10-CM | POA: Diagnosis not present

## 2018-06-04 DIAGNOSIS — N2581 Secondary hyperparathyroidism of renal origin: Secondary | ICD-10-CM | POA: Diagnosis not present

## 2018-06-04 DIAGNOSIS — R519 Headache, unspecified: Secondary | ICD-10-CM

## 2018-06-04 MED ORDER — BUTALBITAL-APAP-CAFFEINE 50-325-40 MG PO TABS: 1.0000 | ORAL_TABLET | Freq: Four times a day (QID) | ORAL | 0 refills | Status: DC | PRN

## 2018-06-04 MED ORDER — PREDNISONE 20 MG PO TABS: 60.0000 mg | ORAL_TABLET | Freq: Every day | ORAL | 0 refills | Status: DC

## 2018-06-04 MED ORDER — GABAPENTIN 300 MG PO CAPS: 300.0000 mg | ORAL_CAPSULE | Freq: Every day | ORAL | 3 refills | Status: DC

## 2018-06-04 NOTE — Progress Notes (Signed)
Subjective:    Patient ID: Megan [name], female    DOB: 1960-11-22, 57 y.o.   MRN: [id number]  HPI 02/22/18 Patient reports pain on both sides of her neck x8 months.  The pain is located over the trapezius just above the right clavicle mainly.  She is tender to palpation in that area.  She states the pain is also present on the left side of her neck though not as severe.  She originally saw orthopedics who performed an x-ray of the neck and saw no significant abnormalities per her report.  She was then referred to physical therapy and the patient states that physical therapy provided no benefit.  According to the patient, the orthopedist tried her on gabapentin without benefit, they also suggested "shots in her neck" however the patient declined this.  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  She denies any falls or injuries.  She has had occasional episodes of pain radiate from her neck all the way down her right arm into her right hand.  These occur sporadically.  The last occurrence has been more than 2 months ago.  However the pain in the trapezius muscle is constant every day.  At that time, my plan was: Differential diagnosis includes chronic muscle pain in the neck versus cervical radiculopathy versus fibromyalgia.  Patient has trialed and failed gabapentin as well as physical therapy under the care of an orthopedist.  Reportedly x-rays of the cervical spine were normal.  I have recommended a trial of muscle relaxers because I believe the majority of her pain is muscle pain.  I recommended Zanaflex every 6-8 hours as needed for pain and also applying heat when necessary for pain.  If neck pain is not improving at that point, I would recommend an MRI to evaluate for degenerative disc disease or cervical radiculopathy.  If no improvement or pathology is seen on the MRI, the next step then would be a referral to a pain clinic for possible trigger point  injections/Botox injections depending upon the recommendation.  We could also consider Cymbalta for possible fibromyalgia given her relatively benign exam.  04/23/18 Patient presents today with a different complaint.  She reports 2 to 3 weeks of bilateral headache.  The headache is located on both temples.  She describes it as a pulsing severe pain.  Still complains of some mild blurry vision.  Denies any photophobia.  She denies any phonophobia.  She denies any nausea or vomiting.  She denies any head traumas or recent concussion.  Her blood pressure is well controlled at 136/74.  She denies any numbness or tingling anywhere on her body.  She does continue to have neck pain and Zanaflex provided no relief.  She denies any jaw claudication.  She denies any pain with chewing.  Originally on her exam I palpated the TMJ area bilaterally and she states that is where the pain was.  However further on exam, she states is over the temporal artery.  She also reports heartburn unrelieved by Zantac.  Pepcid works but they do not want her to take Pepcid.  At that time, my plan was: Potential diagnosis includes temporal arteritis although the patient's younger than what I would expect for this, tension headache, TMJ, trigeminal neuralgia.  I will start the patient on a prednisone taper pack and obtain a sed rate.  If the sedimentation rate is normal, I would not proceed with a temporal artery biopsy given her age.  If the sedimentation rate is elevated, that would however be the next step.  Meanwhile I will treat the patient empirically with a prednisone taper pack.  If no better, I would switch treatment to muscle relaxers for tension headaches/TMJ.  Await the results of the sed rate and her response to prednisone.  Discontinue Zantac and replace with Protonix 40 mg p.o. every morning with food for recalcitrant acid reflux  05/16/18 Patient sedimentation rate was elevated at 87.  Patient ultimately underwent temporal  artery biopsy to rule out temporal arteritis.  Biopsy revealed no evidence of temporal arteritis and she saw no improvement with the prednisone taper pack for her headache.  She continues to endorse bilateral temporal headache on a daily basis.  It has been constant now for over 6 weeks.  Zanaflex does not help the pain but only makes her sleepy.  She also reports diplopia.  She denies any vomiting but she does report nausea with a headache.  She denies any altered mental status.  She denies any other neurologic deficit.  She denies any confusion.  Headache is nonpulsatile.  There is no photophobia or phonophobia area there is no aura.  She did have migraines as a child.  At that time, my plan was: Given the persistent nature of the headache and the neurologic deficit of diplopia, I will proceed with an MRI of the brain to rule out structural lesions that can cause chronic daily headache and diplopia.  Meanwhile start the patient on Topamax 25 mg p.o. nightly and increase by 25 mg weekly until she is on 50 mg p.o. twice daily in an effort to stop the headaches from happening.  Temporal artery biopsy was negative and therefore I believe temporal arteritis has been adequately removed from the differential diagnosis despite elevated sedimentation rate.  TMJ is certainly still in the differential diagnosis however this would not explain the diplopia.  Atypical migraines also would not explain the diplopia but I think are highly likely.  If Topamax does not help an MRI shows no abnormality, we will consult neurology  06/04/18 MRI was obtained of the brain given the persistent nature of the headaches: IMPRESSION: Mild atrophy.  Mild small vessel disease.  No acute stroke or intracranial hemorrhage.  No evidence for previous cortical infarct or large vessel occlusion to suggest complication of vasculitis. No explanation for the headaches was seen on the MRI.  She continues to have bilateral temporal headaches  as well as a frontal headache.  At times is moderate to severe.  She denies any change in her vision.  Again temporal artery biopsy was negative.  However the patient continues to have the elevated sedimentation rate.  She saw no improvement on a brief prednisone taper pack.  She is seen no improvement on Topamax.  Recently she has developed burning and stinging pain in the balls and toes of both feet.  It feels like it is on fire.  There is no erythema.  There is no warmth.  There is no swelling.  I am unable to reproduce the pain with palpation or range of motion.  Pain sounds neuropathic in nature Past Medical History:  Diagnosis Date  . Anemia   . CKD (chronic kidney disease) stage 3, GFR 30-59 ml/min (HCC)   . Essential hypertension, benign   . GERD (gastroesophageal reflux disease)   . Headache   . Hemorrhoids   . Mixed hyperlipidemia   . PONV (postoperative nausea and vomiting)   . Renal insufficiency   .  S/P colonoscopy Jan 2011   Dr. Benson Norway: sessile polyp (benign lymphoid), large hemorrhoids, repeat 5-10 years  . Temporal arteritis ([place])   . Type 2 diabetes mellitus ([place])   . Wears glasses    Past Surgical History:  Procedure Laterality Date  . ABDOMINAL HYSTERECTOMY    . APPENDECTOMY    . ARTERY BIOPSY N/A 05/09/2018   Procedure: RIGHT TEMPORAL ARTERY BIOPSY;  Surgeon: Judeth Horn, MD;  Location: [facility];  Service: General;  Laterality: N/A;  . CATARACT EXTRACTION W/PHACO Left 02/09/2017   Procedure: CATARACT EXTRACTION PHACO AND INTRAOCULAR LENS PLACEMENT LEFT EYE;  Surgeon: Tonny Branch, MD;  Location: [facility];  Service: Ophthalmology;  Laterality: Left;  CDE: 4.89  . CATARACT EXTRACTION W/PHACO Right 06/04/2017   Procedure: CATARACT EXTRACTION PHACO AND INTRAOCULAR LENS PLACEMENT (IOC);  Surgeon: Tonny Branch, MD;  Location: [facility];  Service: Ophthalmology;  Laterality: Right;  CDE: 4.12  . CHOLECYSTECTOMY  09/29/2011   Procedure: LAPAROSCOPIC CHOLECYSTECTOMY;  Surgeon: Jamesetta So, MD;  Location: [facility];  Service: General;  Laterality: N/A;  . COLONOSCOPY  Jan 2011   Dr. Benson Norway: sessile polyp (benign lymphoid), large hemorrhoids, repeat 5-10 years  . COLONOSCOPY N/A 06/12/2016   Procedure: COLONOSCOPY;  Surgeon: Daneil Dolin, MD;  [facility];  Service: Endoscopy;  Laterality: N/A;  1230   . ESOPHAGOGASTRODUODENOSCOPY  09/05/2011   ZTI:[id number] hiatal hernia; remainder of exam normal. No explanation for patient's abdominal pain with today's examination  . ESOPHAGOGASTRODUODENOSCOPY N/A 12/17/2013   Dr. [name]: gastric erythema, erosion, mild chronic inflammation on path   . LAPAROSCOPIC APPENDECTOMY  09/29/2011   Procedure: APPENDECTOMY LAPAROSCOPIC;  Surgeon: Jamesetta So, MD;  Location: [facility];  Service: General;;  incidental appendectomy   Current Outpatient Medications on File Prior to Visit  Medication Sig Dispense Refill  . acetaminophen (TYLENOL) 500 MG tablet Take 1,000 mg by mouth every 6 (six) hours as needed.    Marland Kitchen atorvastatin (LIPITOR) 20 MG tablet TAKE 1 TABLET DAILY 90 tablet 1  . BYSTOLIC 10 MG tablet TAKE 1 TABLET TWICE A DAY (Patient taking differently: Take 10 mg by mouth twice daily) 180 tablet 3  . cinacalcet (SENSIPAR) 30 MG tablet Take 60 mg by mouth daily.     . cloNIDine (CATAPRES) 0.1 MG tablet Take 0.05 mg by mouth 2 (two) times daily.     . ferric citrate (AURYXIA) 1 GM 210 MG(Fe) tablet Take 210-630 mg by mouth See admin instructions. 3 tablets with meals, and 1 tablet with snacks    . folic acid-vitamin b complex-vitamin c-selenium-zinc (DIALYVITE) 3 MG TABS tablet Take 1 tablet by mouth daily.    Marland Kitchen gabapentin (NEURONTIN) 100 MG capsule Take 100 mg by mouth 3 (three) times daily as needed (pain).     Marland Kitchen losartan (COZAAR) 50 MG tablet Take 1 tablet (50 mg total) by mouth daily. 90 tablet 3  . pantoprazole (PROTONIX) 40 MG tablet Take 1 tablet (40 mg total) by mouth daily. 30 tablet 3   No current facility-administered  medications on file prior to visit.    No Known Allergies Social History   Socioeconomic History  . Marital status: Married    Spouse name: Not on file  . Number of children: Not on file  . Years of education: Not on file  . Highest education level: Not on file  Occupational History  . 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  Financial resource strain: Not on file  . Food insecurity:    Worry: Not on file    Inability: Not on file  . Transportation needs:    Medical: Not on file    Non-medical: Not on file  Tobacco Use  . Smoking status: Never Smoker  . Smokeless tobacco: Never Used  Substance and Sexual Activity  . Alcohol use: No  . Drug use: No  . Sexual activity: Yes    Birth control/protection: Surgical  Lifestyle  . Physical activity:    Days per week: Not on file    Minutes per session: Not on file  . Stress: Not on file  Relationships  . Social connections:    Talks on phone: Not on file    Gets together: Not on file    Attends religious service: Not on file    Active member of club or organization: Not on file    Attends meetings of clubs or organizations: Not on file    Relationship status: Not on file  . Intimate partner violence:    Fear of current or ex partner: Not on file    Emotionally abused: Not on file    Physically abused: Not on file    Forced sexual activity: Not on file  Other Topics Concern  . Not on file  Social History Narrative   Works at [employer] in [place].    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   Also has to get items from high shelves-causes achy pain in shoulder area      Married.   Children are grown, out of house.     Review of Systems  All other systems reviewed and are negative.      Objective:   Physical Exam  Constitutional: She is oriented to person, place, and time. She appears well-developed and well-nourished. No distress.  HENT:  Head:    Right Ear: Tympanic  membrane, external ear and ear canal normal.  Left Ear: Tympanic membrane, external ear and ear canal normal.  Nose: Nose normal.  Mouth/Throat: Oropharynx is clear and moist. No oropharyngeal exudate.  Eyes: Conjunctivae are normal.  Neck: Neck supple.  Cardiovascular: Normal rate, regular rhythm and normal heart sounds.  Pulmonary/Chest: Effort normal and breath sounds normal. No respiratory distress. She has no wheezes. She has no rales.  Musculoskeletal:       Right foot: There is normal range of motion, no tenderness, no bony tenderness, no swelling, normal capillary refill, no crepitus, no deformity and no laceration.       Left foot: There is normal range of motion, no tenderness, no bony tenderness, no swelling, no crepitus, no deformity and no laceration.  Lymphadenopathy:    She has no cervical adenopathy.  Neurological: She is alert and oriented to person, place, and time. She displays normal reflexes. No cranial nerve deficit. She exhibits normal muscle tone. Coordination normal.  Skin: She is not diaphoretic.  Vitals reviewed.         Assessment & Plan:  Chronic daily headache - Plan: predniSONE (DELTASONE) 20 MG tablet  I believe the pain in her feet is likely neuropathic in nature.  Discontinue Topamax and start the patient on gabapentin 300 mg p.o. nightly.  She is on hemodialysis.  I will be hesitant to increase gabapentin beyond 300 mg daily but hopefully this would help with the neuropathic pain.  If it is beneficial, we could try to increase up to a  maximum of 300 mg 3 times a day.    I would also hope that the gabapentin may stop her headaches.  I will give her Fioricet.  She can take 1 tablet every 6 hours as needed for severe headache.  However I am still concerned given her elevated sedimentation rate.  Therefore I will also start the patient on prednisone 60 mg a day as well given the fact she is also having intermittent blurry vision.  This is being done despite  the negative temporal artery biopsy.  However I would recheck the patient in 1 week to see if the headache is improving.  If improving in 1 week, we may discontinue gabapentin and Fioricet to see if the headaches return, to determine whether the gabapentin improves the headache or whether the prednisone improve the headache.  However I am concerned given the blurry vision about not adequately treating temporal arteritis immediately.

## 2018-06-06 DIAGNOSIS — N2581 Secondary hyperparathyroidism of renal origin: Secondary | ICD-10-CM | POA: Diagnosis not present

## 2018-06-06 DIAGNOSIS — E1129 Type 2 diabetes mellitus with other diabetic kidney complication: Secondary | ICD-10-CM | POA: Diagnosis not present

## 2018-06-06 DIAGNOSIS — D631 Anemia in chronic kidney disease: Secondary | ICD-10-CM | POA: Diagnosis not present

## 2018-06-06 DIAGNOSIS — E119 Type 2 diabetes mellitus without complications: Secondary | ICD-10-CM | POA: Diagnosis not present

## 2018-06-06 DIAGNOSIS — N186 End stage renal disease: Secondary | ICD-10-CM | POA: Diagnosis not present

## 2018-06-08 DIAGNOSIS — D631 Anemia in chronic kidney disease: Secondary | ICD-10-CM | POA: Diagnosis not present

## 2018-06-08 DIAGNOSIS — N186 End stage renal disease: Secondary | ICD-10-CM | POA: Diagnosis not present

## 2018-06-08 DIAGNOSIS — N2581 Secondary hyperparathyroidism of renal origin: Secondary | ICD-10-CM | POA: Diagnosis not present

## 2018-06-08 DIAGNOSIS — E1129 Type 2 diabetes mellitus with other diabetic kidney complication: Secondary | ICD-10-CM | POA: Diagnosis not present

## 2018-06-08 DIAGNOSIS — E119 Type 2 diabetes mellitus without complications: Secondary | ICD-10-CM | POA: Diagnosis not present

## 2018-06-11 DIAGNOSIS — N2581 Secondary hyperparathyroidism of renal origin: Secondary | ICD-10-CM | POA: Diagnosis not present

## 2018-06-11 DIAGNOSIS — E119 Type 2 diabetes mellitus without complications: Secondary | ICD-10-CM | POA: Diagnosis not present

## 2018-06-11 DIAGNOSIS — E1129 Type 2 diabetes mellitus with other diabetic kidney complication: Secondary | ICD-10-CM | POA: Diagnosis not present

## 2018-06-11 DIAGNOSIS — N186 End stage renal disease: Secondary | ICD-10-CM | POA: Diagnosis not present

## 2018-06-11 DIAGNOSIS — D631 Anemia in chronic kidney disease: Secondary | ICD-10-CM | POA: Diagnosis not present

## 2018-06-13 ENCOUNTER — Other Ambulatory Visit: Payer: Self-pay | Admitting: Family Medicine

## 2018-06-13 DIAGNOSIS — E1129 Type 2 diabetes mellitus with other diabetic kidney complication: Secondary | ICD-10-CM | POA: Diagnosis not present

## 2018-06-13 DIAGNOSIS — E119 Type 2 diabetes mellitus without complications: Secondary | ICD-10-CM | POA: Diagnosis not present

## 2018-06-13 DIAGNOSIS — N186 End stage renal disease: Secondary | ICD-10-CM | POA: Diagnosis not present

## 2018-06-13 DIAGNOSIS — D631 Anemia in chronic kidney disease: Secondary | ICD-10-CM | POA: Diagnosis not present

## 2018-06-13 DIAGNOSIS — N2581 Secondary hyperparathyroidism of renal origin: Secondary | ICD-10-CM | POA: Diagnosis not present

## 2018-06-13 MED ORDER — CINACALCET HCL 30 MG PO TABS: 60.0000 mg | ORAL_TABLET | Freq: Every day | ORAL | 0 refills | Status: DC

## 2018-06-13 MED ORDER — NEBIVOLOL HCL 10 MG PO TABS: 10.0000 mg | ORAL_TABLET | Freq: Two times a day (BID) | ORAL | 3 refills | Status: DC

## 2018-06-13 NOTE — Telephone Encounter (Signed)
[facility] on [facility]stolic [facility]   Her headaches are better on a scale of 1 - 10 they are now a 3.

## 2018-06-13 NOTE — Telephone Encounter (Signed)
RX sent to pharmacy  

## 2018-06-14 DIAGNOSIS — Z992 Dependence on renal dialysis: Secondary | ICD-10-CM | POA: Diagnosis not present

## 2018-06-14 DIAGNOSIS — N186 End stage renal disease: Secondary | ICD-10-CM | POA: Diagnosis not present

## 2018-06-14 DIAGNOSIS — N040 Nephrotic syndrome with minor glomerular abnormality: Secondary | ICD-10-CM | POA: Diagnosis not present

## 2018-06-15 DIAGNOSIS — N186 End stage renal disease: Secondary | ICD-10-CM | POA: Diagnosis not present

## 2018-06-15 DIAGNOSIS — D631 Anemia in chronic kidney disease: Secondary | ICD-10-CM | POA: Diagnosis not present

## 2018-06-15 DIAGNOSIS — E119 Type 2 diabetes mellitus without complications: Secondary | ICD-10-CM | POA: Diagnosis not present

## 2018-06-15 DIAGNOSIS — N2581 Secondary hyperparathyroidism of renal origin: Secondary | ICD-10-CM | POA: Diagnosis not present

## 2018-06-15 DIAGNOSIS — E1129 Type 2 diabetes mellitus with other diabetic kidney complication: Secondary | ICD-10-CM | POA: Diagnosis not present

## 2018-06-17 ENCOUNTER — Encounter: Payer: Self-pay | Admitting: Neurology

## 2018-06-17 ENCOUNTER — Ambulatory Visit (INDEPENDENT_AMBULATORY_CARE_PROVIDER_SITE_OTHER): Payer: Medicare Other | Admitting: Neurology

## 2018-06-17 VITALS — BP 128/76 | HR 88 | Ht 62.0 in | Wt 185.6 lb

## 2018-06-17 DIAGNOSIS — R0683 Snoring: Secondary | ICD-10-CM | POA: Diagnosis not present

## 2018-06-17 DIAGNOSIS — G4452 New daily persistent headache (NDPH): Secondary | ICD-10-CM

## 2018-06-17 MED ORDER — TIZANIDINE HCL 4 MG PO CAPS: 4.0000 mg | ORAL_CAPSULE | Freq: Two times a day (BID) | ORAL | 1 refills | Status: DC

## 2018-06-17 NOTE — Patient Instructions (Signed)
I had a long discussion with the patient and her husband regarding her new persistent daily headache which sounds like muscle tension headaches with analgesic rebound.  I recommend she discontinue Tylenol due to analgesic rebound effect and do regular neck stretching exercises and local heat application.  Start Zanaflex 4 mg at night for 2 weeks and if tolerated increase further to twice daily.  Refer for polysomnogram for sleep apnea.  She will return for follow-up in 3 months or call earlier if necessary.  Neck Exercises Neck exercises can be important for many reasons:  They can help you to improve and maintain flexibility in your neck. This can be especially important as you age.  They can help to make your neck stronger. This can make movement easier.  They can reduce or prevent neck pain.  They may help your upper back.  Ask your health care provider which neck exercises would be best for you. Exercises Neck Press Repeat this exercise 10 times. Do it first thing in the morning and right before bed or as told by your health care provider. 1. Lie on your back on a firm bed or on the floor with a pillow under your head. 2. Use your neck muscles to push your head down on the pillow and straighten your spine. 3. Hold the position as well as you can. Keep your head facing up and your chin tucked. 4. Slowly count to 5 while holding this position. 5. Relax for a few seconds. Then repeat.  Isometric Strengthening Do a full set of these exercises 2 times a day or as told by your health care provider. 1. Sit in a supportive chair and place your hand on your forehead. 2. Push forward with your head and neck while pushing back with your hand. Hold for 10 seconds. 3. Relax. Then repeat the exercise 3 times. 4. Next, do thesequence again, this time putting your hand against the back of your head. Use your head and neck to push backward against the hand pressure. 5. Finally, do the same exercise  on either side of your head, pushing sideways against the pressure of your hand.  Prone Head Lifts Repeat this exercise 5 times. Do this 2 times a day or as told by your health care provider. 1. Lie face-down, resting on your elbows so that your chest and upper back are raised. 2. Start with your head facing downward, near your chest. Position your chin either on or near your chest. 3. Slowly lift your head upward. Lift until you are looking straight ahead. Then continue lifting your head as far back as you can stretch. 4. Hold your head up for 5 seconds. Then slowly lower it to your starting position.  Supine Head Lifts Repeat this exercise 8-10 times. Do this 2 times a day or as told by your health care provider. 1. Lie on your back, bending your knees to point to the ceiling and keeping your feet flat on the floor. 2. Lift your head slowly off the floor, raising your chin toward your chest. 3. Hold for 5 seconds. 4. Relax and repeat.  Scapular Retraction Repeat this exercise 5 times. Do this 2 times a day or as told by your health care provider. 1. Stand with your arms at your sides. Look straight ahead. 2. Slowly pull both shoulders backward and downward until you feel a stretch between your shoulder blades in your upper back. 3. Hold for 10-30 seconds. 4. Relax and repeat.  Contact  a health care provider if:  Your neck pain or discomfort gets much worse when you do an exercise.  Your neck pain or discomfort does not improve within 2 hours after you exercise. If you have any of these problems, stop exercising right away. Do not do the exercises again unless your health care provider says that you can. Get help right away if:  You develop sudden, severe neck pain. If this happens, stop exercising right away. Do not do the exercises again unless your health care provider says that you can. Exercises Neck Stretch  Repeat this exercise 3-5 times. 1. Do this exercise while standing  or while sitting in a chair. 2. Place your feet flat on the floor, shoulder-width apart. 3. Slowly turn your head to the right. Turn it all the way to the right so you can look over your right shoulder. Do not tilt or tip your head. 4. Hold this position for 10-30 seconds. 5. Slowly turn your head to the left, to look over your left shoulder. 6. Hold this position for 10-30 seconds.  Neck Retraction Repeat this exercise 8-10 times. Do this 3-4 times a day or as told by your health care provider. 1. Do this exercise while standing or while sitting in a sturdy chair. 2. Look straight ahead. Do not bend your neck. 3. Use your fingers to push your chin backward. Do not bend your neck for this movement. Continue to face straight ahead. If you are doing the exercise properly, you will feel a slight sensation in your throat and a stretch at the back of your neck. 4. Hold the stretch for 1-2 seconds. Relax and repeat.  This information is not intended to replace advice given to you by your health care provider. Make sure you discuss any questions you have with your health care provider. Document Released: 07/12/2015 Document Revised: 01/06/2016 Document Reviewed: 02/08/2015 Elsevier Interactive Patient Education  2018 [employer].  Tension Headache A tension headache is a feeling of pain, pressure, or aching that is often felt over the front and sides of the head. The pain can be dull, or it can feel tight (constricting). Tension headaches are not normally associated with nausea or vomiting, and they do not get worse with physical activity. Tension headaches can last from 30 minutes to several days. This is the most common type of headache. CAUSES The exact cause of this condition is not known. Tension headaches often begin after stress, anxiety, or depression. Other triggers may include:  Alcohol.  Too much caffeine, or caffeine withdrawal.  Respiratory infections, such as colds, flu, or sinus  infections.  Dental problems or teeth clenching.  Fatigue.  Holding your head and neck in the same position for a long period of time, such as while using a computer.  Smoking. SYMPTOMS Symptoms of this condition include:  A feeling of pressure around the head.  Dull, aching head pain.  Pain felt over the front and sides of the head.  Tenderness in the muscles of the head, neck, and shoulders. DIAGNOSIS This condition may be diagnosed based on your symptoms and a physical exam. Tests may be done, such as a CT scan or an MRI of your head. These tests may be done if your symptoms are severe or unusual. TREATMENT This condition may be treated with lifestyle changes and medicines to help relieve symptoms. HOME CARE INSTRUCTIONS Managing Pain  Take over-the-counter and prescription medicines only as told by your health care provider.  Shanda Howells  down in a dark, quiet room when you have a headache.  If directed, apply ice to the head and neck area: ? Put ice in a plastic bag. ? Place a towel between your skin and the bag. ? Leave the ice on for 20 minutes, 2-3 times per day.  Use a heating pad or a hot shower to apply heat to the head and neck area as told by your health care provider. Eating and Drinking  Eat meals on a regular schedule.  Limit alcohol use.  Decrease your caffeine intake, or stop using caffeine. General Instructions  Keep all follow-up visits as told by your health care provider. This is important.  Keep a headache journal to help find out what may trigger your headaches. For example, write down: ? What you eat and drink. ? How much sleep you get. ? Any change to your diet or medicines.  Try massage or other relaxation techniques.  Limit stress.  Sit up straight, and avoid tensing your muscles.  Do not use tobacco products, including cigarettes, chewing tobacco, or e-cigarettes. If you need help quitting, ask your health care provider.  Exercise  regularly as told by your health care provider.  Get 7-9 hours of sleep, or the amount recommended by your health care provider. SEEK MEDICAL CARE IF:  Your symptoms are not helped by medicine.  You have a headache that is different from what you normally experience.  You have nausea or you vomit.  You have a fever. SEEK IMMEDIATE MEDICAL CARE IF:  Your headache becomes severe.  You have repeated vomiting.  You have a stiff neck.  You have a loss of vision.  You have problems with speech.  You have pain in your eye or ear.  You have muscular weakness or loss of muscle control.  You lose your balance or you have trouble walking.  You feel faint or you pass out.  You have confusion. This information is not intended to replace advice given to you by your health care provider. Make sure you discuss any questions you have with your health care provider. Document Released: 07/31/2005 Document Revised: 04/21/2015 Document Reviewed: 11/23/2014 Elsevier Interactive Patient Education  2017 [employer].

## 2018-06-17 NOTE — Progress Notes (Signed)
[facility] [address] [place]. [place] [address] [phone]       OFFICE CONSULT NOTE  Megan [name] Date of Birth:  April 08, 1961 Medical Record Number:  [id number]   Referring MD: Jenna Luo,  Reason for Referral: Headache HPI: Megan [name] is a pleasant 57 year old African-American lady seen today for initial office consultation visit.  She is accompanied by her husband.  History is obtained from them and review of referral records.  I personally reviewed imaging films in PACS.  She started complaining of new onset persistent headache for the last 2 months.  She describes the headache as being bitemporal pressure-like in quality 4/10 in severity.  It is constant and present throughout the day.  There is no accompanying nausea vomiting light or sound sensitivity.  She is able to carry out her usual activities of daily living despite the headache.  She in fact even had a temporal artery biopsy performed which was negative for temporal arteritis.  She has been taking 4 to 5 tablets of Tylenol every day on a daily basis which does not really help.  She took gabapentin also which does not help but helps her sleep.  She was prescribed Topamax but took only up to 50 mg a day and stopped as it did not help her.  She does have chronic renal failure and is afraid to increase the dose of gabapentin and Topamax because of that.  She does admit to snoring and daytime sleepiness and restless sleep but has not been evaluated for sleep apnea.  She denies any prior history of migraine headaches a family history of migraines.  She does complain of intermittent transient diplopia when driving in a car and looking at distance signs however when she is close to the sign she can see clearly.  She denies any trouble while reading or watching television or watching a movie.  She does complain of significant muscle tightness and pain in the back of her neck and shoulders which is  chronic.  She does have fibromyalgia.  She has been taking Zanaflex but not on a regular basis.  ROS:   14 system review of systems is positive for headache, blurred vision, snoring, double vision and all other systems negative.  PMH:  Past Medical History:  Diagnosis Date  . Anemia   . CKD (chronic kidney disease) stage 3, GFR 30-59 ml/min (HCC)   . Essential hypertension, benign   . GERD (gastroesophageal reflux disease)   . Headache   . Hemorrhoids   . Mixed hyperlipidemia   . PONV (postoperative nausea and vomiting)   . Renal insufficiency   . S/P colonoscopy Jan 2011   Dr. Benson Norway: sessile polyp (benign lymphoid), large hemorrhoids, repeat 5-10 years  . Temporal arteritis ([place])   . Type 2 diabetes mellitus ([place])   . Wears glasses     Social History:  Social History   Socioeconomic History  . Marital status: Married    Spouse name: Not on file  . Number of children: Not on file  . Years of education: Not on file  . Highest education level: Not on file  Occupational History  . 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 Financial resource strain: Not on file  . Food insecurity:    Worry: Not on file    Inability: Not on file  . Transportation needs:    Medical: Not on file    Non-medical: Not  on file  Tobacco Use  . Smoking status: Never Smoker  . Smokeless tobacco: Never Used  Substance and Sexual Activity  . Alcohol use: No  . Drug use: No  . Sexual activity: Yes    Birth control/protection: Surgical  Lifestyle  . Physical activity:    Days per week: Not on file    Minutes per session: Not on file  . Stress: Not on file  Relationships  . Social connections:    Talks on phone: Not on file    Gets together: Not on file    Attends religious service: Not on file    Active member of club or organization: Not on file    Attends meetings of clubs or organizations: Not on file    Relationship status: Not on file  . Intimate partner  violence:    Fear of current or ex partner: Not on file    Emotionally abused: Not on file    Physically abused: Not on file    Forced sexual activity: Not on file  Other Topics Concern  . Not on file  Social History Narrative   Works at [employer] in [place].    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   Also has to get items from high shelves-causes achy pain in shoulder area      Married.   Children are grown, out of house.    Medications:   Current Outpatient Medications on File Prior to Visit  Medication Sig Dispense Refill  . acetaminophen (TYLENOL) 500 MG tablet Take 1,000 mg by mouth every 6 (six) hours as needed.    Marland Kitchen atorvastatin (LIPITOR) 20 MG tablet TAKE 1 TABLET DAILY 90 tablet 1  . butalbital-acetaminophen-caffeine (FIORICET, ESGIC) 50-325-40 MG tablet Take 1 tablet by mouth every 6 (six) hours as needed for headache. 20 tablet 0  . cinacalcet (SENSIPAR) 30 MG tablet Take 2 tablets (60 mg total) by mouth daily. 90 tablet 0  . cloNIDine (CATAPRES) 0.1 MG tablet Take 0.05 mg by mouth 2 (two) times daily.     . ferric citrate (AURYXIA) 1 GM 210 MG(Fe) tablet Take 210-630 mg by mouth See admin instructions. 3 tablets with meals, and 1 tablet with snacks    . folic acid-vitamin b complex-vitamin c-selenium-zinc (DIALYVITE) 3 MG TABS tablet Take 1 tablet by mouth daily.    Marland Kitchen gabapentin (NEURONTIN) 300 MG capsule Take 1 capsule (300 mg total) by mouth at bedtime. 30 capsule 3  . losartan (COZAAR) 50 MG tablet Take 1 tablet (50 mg total) by mouth daily. 90 tablet 3  . nebivolol (BYSTOLIC) 10 MG tablet Take 1 tablet (10 mg total) by mouth 2 (two) times daily. 180 tablet 3  . pantoprazole (PROTONIX) 40 MG tablet Take 1 tablet (40 mg total) by mouth daily. 30 tablet 3   No current facility-administered medications on file prior to visit.     Allergies:  No Known Allergies  Physical Exam General: well developed, well nourished middle-aged African-American lady,  seated, in no evident distress Head: head normocephalic and atraumatic.   Neck: supple with no carotid or supraclavicular bruits Cardiovascular: regular rate and rhythm, no murmurs Musculoskeletal: no deformity Skin:  no rash/petichiae.  Scar left forearm from dialysis fistula. Vascular:  Normal pulses all extremities except dialysis fistula in the left forearm  Neurologic Exam Mental Status: Awake and fully alert. Oriented to place and time. Recent and remote memory intact. Attention span, concentration and fund of knowledge  appropriate. Mood and affect appropriate.  Cranial Nerves: Fundoscopic exam reveals sharp disc margins. Pupils equal, briskly reactive to light. Extraocular movements full without nystagmus. Visual fields full to confrontation. Hearing intact. Facial sensation intact. Face, tongue, palate moves normally and symmetrically.  Motor: Normal bulk and tone. Normal strength in all tested extremity muscles. Sensory.: intact to touch , pinprick , position and vibratory sensation.  Coordination: Rapid alternating movements normal in all extremities. Finger-to-nose and heel-to-shin performed accurately bilaterally. Gait and Station: Arises from chair without difficulty. Stance is normal. Gait demonstrates normal stride length and balance . Able to heel, toe and tandem walk without difficulty.  Reflexes: 1+ and symmetric. Toes downgoing.       ASSESSMENT: 57 year old lady with new persistent daily headache likely muscle tension headache with analgesic rebound.  Normal neurological exam and neuroimaging study     PLAN: I had a long discussion with the patient and her husband regarding her new persistent daily headache which sounds like muscle tension headaches with analgesic rebound.  I recommend she discontinue Tylenol due to analgesic rebound effect and do regular neck stretching exercises and local heat application.  Start Zanaflex 4 mg at night for 2 weeks and if tolerated  increase further to twice daily.  Refer for polysomnogram for sleep apnea.  Greater than 50% time during this 45-minute consultation visit was spent on counseling and coordination of care about her new persistent daily headache, discussion about tension headaches and analgesic rebound headache and answering questions.  Greater than 50% time during this 45-minute consultation visit was spent on counseling and coordination of care about her headache and answering questions She will return for follow-up in 3 months or call earlier if necessary. Antony Contras, MD  [facility] [address] [place], [place] [address]  Phone [phone] Fax [phone] Note: This document was prepared with digital dictation and possible smart phrase technology. Any transcriptional errors that result from this process are unintentional.

## 2018-06-18 DIAGNOSIS — E1129 Type 2 diabetes mellitus with other diabetic kidney complication: Secondary | ICD-10-CM | POA: Diagnosis not present

## 2018-06-18 DIAGNOSIS — E119 Type 2 diabetes mellitus without complications: Secondary | ICD-10-CM | POA: Diagnosis not present

## 2018-06-18 DIAGNOSIS — D631 Anemia in chronic kidney disease: Secondary | ICD-10-CM | POA: Diagnosis not present

## 2018-06-18 DIAGNOSIS — N186 End stage renal disease: Secondary | ICD-10-CM | POA: Diagnosis not present

## 2018-06-18 DIAGNOSIS — N2581 Secondary hyperparathyroidism of renal origin: Secondary | ICD-10-CM | POA: Diagnosis not present

## 2018-06-19 ENCOUNTER — Other Ambulatory Visit: Payer: Self-pay | Admitting: Family Medicine

## 2018-06-19 MED ORDER — CLONIDINE HCL 0.1 MG PO TABS: 0.0500 mg | ORAL_TABLET | Freq: Two times a day (BID) | ORAL | 3 refills | Status: DC

## 2018-06-20 ENCOUNTER — Ambulatory Visit: Payer: Medicare Other | Admitting: Gastroenterology

## 2018-06-20 DIAGNOSIS — E119 Type 2 diabetes mellitus without complications: Secondary | ICD-10-CM | POA: Diagnosis not present

## 2018-06-20 DIAGNOSIS — N2581 Secondary hyperparathyroidism of renal origin: Secondary | ICD-10-CM | POA: Diagnosis not present

## 2018-06-20 DIAGNOSIS — N186 End stage renal disease: Secondary | ICD-10-CM | POA: Diagnosis not present

## 2018-06-20 DIAGNOSIS — E1129 Type 2 diabetes mellitus with other diabetic kidney complication: Secondary | ICD-10-CM | POA: Diagnosis not present

## 2018-06-20 DIAGNOSIS — D631 Anemia in chronic kidney disease: Secondary | ICD-10-CM | POA: Diagnosis not present

## 2018-06-22 DIAGNOSIS — N2581 Secondary hyperparathyroidism of renal origin: Secondary | ICD-10-CM | POA: Diagnosis not present

## 2018-06-22 DIAGNOSIS — N186 End stage renal disease: Secondary | ICD-10-CM | POA: Diagnosis not present

## 2018-06-22 DIAGNOSIS — E119 Type 2 diabetes mellitus without complications: Secondary | ICD-10-CM | POA: Diagnosis not present

## 2018-06-22 DIAGNOSIS — E1129 Type 2 diabetes mellitus with other diabetic kidney complication: Secondary | ICD-10-CM | POA: Diagnosis not present

## 2018-06-22 DIAGNOSIS — D631 Anemia in chronic kidney disease: Secondary | ICD-10-CM | POA: Diagnosis not present

## 2018-06-25 DIAGNOSIS — N2581 Secondary hyperparathyroidism of renal origin: Secondary | ICD-10-CM | POA: Diagnosis not present

## 2018-06-25 DIAGNOSIS — D631 Anemia in chronic kidney disease: Secondary | ICD-10-CM | POA: Diagnosis not present

## 2018-06-25 DIAGNOSIS — N186 End stage renal disease: Secondary | ICD-10-CM | POA: Diagnosis not present

## 2018-06-25 DIAGNOSIS — E119 Type 2 diabetes mellitus without complications: Secondary | ICD-10-CM | POA: Diagnosis not present

## 2018-06-25 DIAGNOSIS — E1129 Type 2 diabetes mellitus with other diabetic kidney complication: Secondary | ICD-10-CM | POA: Diagnosis not present

## 2018-06-27 DIAGNOSIS — N2581 Secondary hyperparathyroidism of renal origin: Secondary | ICD-10-CM | POA: Diagnosis not present

## 2018-06-27 DIAGNOSIS — D631 Anemia in chronic kidney disease: Secondary | ICD-10-CM | POA: Diagnosis not present

## 2018-06-27 DIAGNOSIS — E1129 Type 2 diabetes mellitus with other diabetic kidney complication: Secondary | ICD-10-CM | POA: Diagnosis not present

## 2018-06-27 DIAGNOSIS — E119 Type 2 diabetes mellitus without complications: Secondary | ICD-10-CM | POA: Diagnosis not present

## 2018-06-27 DIAGNOSIS — N186 End stage renal disease: Secondary | ICD-10-CM | POA: Diagnosis not present

## 2018-06-28 DIAGNOSIS — N186 End stage renal disease: Secondary | ICD-10-CM | POA: Diagnosis not present

## 2018-06-28 DIAGNOSIS — E1129 Type 2 diabetes mellitus with other diabetic kidney complication: Secondary | ICD-10-CM | POA: Diagnosis not present

## 2018-06-28 DIAGNOSIS — N2581 Secondary hyperparathyroidism of renal origin: Secondary | ICD-10-CM | POA: Diagnosis not present

## 2018-06-28 DIAGNOSIS — D631 Anemia in chronic kidney disease: Secondary | ICD-10-CM | POA: Diagnosis not present

## 2018-06-28 DIAGNOSIS — E119 Type 2 diabetes mellitus without complications: Secondary | ICD-10-CM | POA: Diagnosis not present

## 2018-07-01 DIAGNOSIS — D631 Anemia in chronic kidney disease: Secondary | ICD-10-CM | POA: Diagnosis not present

## 2018-07-01 DIAGNOSIS — E1129 Type 2 diabetes mellitus with other diabetic kidney complication: Secondary | ICD-10-CM | POA: Diagnosis not present

## 2018-07-01 DIAGNOSIS — N186 End stage renal disease: Secondary | ICD-10-CM | POA: Diagnosis not present

## 2018-07-01 DIAGNOSIS — E119 Type 2 diabetes mellitus without complications: Secondary | ICD-10-CM | POA: Diagnosis not present

## 2018-07-01 DIAGNOSIS — N2581 Secondary hyperparathyroidism of renal origin: Secondary | ICD-10-CM | POA: Diagnosis not present

## 2018-07-03 DIAGNOSIS — D631 Anemia in chronic kidney disease: Secondary | ICD-10-CM | POA: Diagnosis not present

## 2018-07-03 DIAGNOSIS — E119 Type 2 diabetes mellitus without complications: Secondary | ICD-10-CM | POA: Diagnosis not present

## 2018-07-03 DIAGNOSIS — E1129 Type 2 diabetes mellitus with other diabetic kidney complication: Secondary | ICD-10-CM | POA: Diagnosis not present

## 2018-07-03 DIAGNOSIS — N186 End stage renal disease: Secondary | ICD-10-CM | POA: Diagnosis not present

## 2018-07-03 DIAGNOSIS — N2581 Secondary hyperparathyroidism of renal origin: Secondary | ICD-10-CM | POA: Diagnosis not present

## 2018-07-06 DIAGNOSIS — E119 Type 2 diabetes mellitus without complications: Secondary | ICD-10-CM | POA: Diagnosis not present

## 2018-07-06 DIAGNOSIS — E1129 Type 2 diabetes mellitus with other diabetic kidney complication: Secondary | ICD-10-CM | POA: Diagnosis not present

## 2018-07-06 DIAGNOSIS — N2581 Secondary hyperparathyroidism of renal origin: Secondary | ICD-10-CM | POA: Diagnosis not present

## 2018-07-06 DIAGNOSIS — N186 End stage renal disease: Secondary | ICD-10-CM | POA: Diagnosis not present

## 2018-07-06 DIAGNOSIS — D631 Anemia in chronic kidney disease: Secondary | ICD-10-CM | POA: Diagnosis not present

## 2018-07-08 DIAGNOSIS — N2581 Secondary hyperparathyroidism of renal origin: Secondary | ICD-10-CM | POA: Diagnosis not present

## 2018-07-08 DIAGNOSIS — D631 Anemia in chronic kidney disease: Secondary | ICD-10-CM | POA: Diagnosis not present

## 2018-07-08 DIAGNOSIS — E1129 Type 2 diabetes mellitus with other diabetic kidney complication: Secondary | ICD-10-CM | POA: Diagnosis not present

## 2018-07-08 DIAGNOSIS — N186 End stage renal disease: Secondary | ICD-10-CM | POA: Diagnosis not present

## 2018-07-08 DIAGNOSIS — E119 Type 2 diabetes mellitus without complications: Secondary | ICD-10-CM | POA: Diagnosis not present

## 2018-07-09 ENCOUNTER — Encounter

## 2018-07-09 ENCOUNTER — Ambulatory Visit: Payer: Medicare Other | Admitting: Nurse Practitioner

## 2018-07-10 DIAGNOSIS — E1129 Type 2 diabetes mellitus with other diabetic kidney complication: Secondary | ICD-10-CM | POA: Diagnosis not present

## 2018-07-10 DIAGNOSIS — E119 Type 2 diabetes mellitus without complications: Secondary | ICD-10-CM | POA: Diagnosis not present

## 2018-07-10 DIAGNOSIS — D631 Anemia in chronic kidney disease: Secondary | ICD-10-CM | POA: Diagnosis not present

## 2018-07-10 DIAGNOSIS — N2581 Secondary hyperparathyroidism of renal origin: Secondary | ICD-10-CM | POA: Diagnosis not present

## 2018-07-10 DIAGNOSIS — N186 End stage renal disease: Secondary | ICD-10-CM | POA: Diagnosis not present

## 2018-07-13 DIAGNOSIS — N2581 Secondary hyperparathyroidism of renal origin: Secondary | ICD-10-CM | POA: Diagnosis not present

## 2018-07-13 DIAGNOSIS — N186 End stage renal disease: Secondary | ICD-10-CM | POA: Diagnosis not present

## 2018-07-13 DIAGNOSIS — E119 Type 2 diabetes mellitus without complications: Secondary | ICD-10-CM | POA: Diagnosis not present

## 2018-07-13 DIAGNOSIS — E1129 Type 2 diabetes mellitus with other diabetic kidney complication: Secondary | ICD-10-CM | POA: Diagnosis not present

## 2018-07-13 DIAGNOSIS — D631 Anemia in chronic kidney disease: Secondary | ICD-10-CM | POA: Diagnosis not present

## 2018-07-14 DIAGNOSIS — Z992 Dependence on renal dialysis: Secondary | ICD-10-CM | POA: Diagnosis not present

## 2018-07-14 DIAGNOSIS — N040 Nephrotic syndrome with minor glomerular abnormality: Secondary | ICD-10-CM | POA: Diagnosis not present

## 2018-07-14 DIAGNOSIS — N186 End stage renal disease: Secondary | ICD-10-CM | POA: Diagnosis not present

## 2018-07-16 DIAGNOSIS — D631 Anemia in chronic kidney disease: Secondary | ICD-10-CM | POA: Diagnosis not present

## 2018-07-16 DIAGNOSIS — N2581 Secondary hyperparathyroidism of renal origin: Secondary | ICD-10-CM | POA: Diagnosis not present

## 2018-07-16 DIAGNOSIS — E1129 Type 2 diabetes mellitus with other diabetic kidney complication: Secondary | ICD-10-CM | POA: Diagnosis not present

## 2018-07-16 DIAGNOSIS — N186 End stage renal disease: Secondary | ICD-10-CM | POA: Diagnosis not present

## 2018-07-16 DIAGNOSIS — E119 Type 2 diabetes mellitus without complications: Secondary | ICD-10-CM | POA: Diagnosis not present

## 2018-07-17 ENCOUNTER — Other Ambulatory Visit: Payer: Self-pay | Admitting: Family Medicine

## 2018-07-17 MED ORDER — CLONIDINE HCL 0.1 MG PO TABS: 0.1000 mg | ORAL_TABLET | Freq: Two times a day (BID) | ORAL | 3 refills | Status: DC

## 2018-07-19 DIAGNOSIS — E1129 Type 2 diabetes mellitus with other diabetic kidney complication: Secondary | ICD-10-CM | POA: Diagnosis not present

## 2018-07-19 DIAGNOSIS — D631 Anemia in chronic kidney disease: Secondary | ICD-10-CM | POA: Diagnosis not present

## 2018-07-19 DIAGNOSIS — N186 End stage renal disease: Secondary | ICD-10-CM | POA: Diagnosis not present

## 2018-07-19 DIAGNOSIS — N2581 Secondary hyperparathyroidism of renal origin: Secondary | ICD-10-CM | POA: Diagnosis not present

## 2018-07-19 DIAGNOSIS — E119 Type 2 diabetes mellitus without complications: Secondary | ICD-10-CM | POA: Diagnosis not present

## 2018-07-20 DIAGNOSIS — E1129 Type 2 diabetes mellitus with other diabetic kidney complication: Secondary | ICD-10-CM | POA: Diagnosis not present

## 2018-07-20 DIAGNOSIS — N186 End stage renal disease: Secondary | ICD-10-CM | POA: Diagnosis not present

## 2018-07-20 DIAGNOSIS — N2581 Secondary hyperparathyroidism of renal origin: Secondary | ICD-10-CM | POA: Diagnosis not present

## 2018-07-20 DIAGNOSIS — E119 Type 2 diabetes mellitus without complications: Secondary | ICD-10-CM | POA: Diagnosis not present

## 2018-07-20 DIAGNOSIS — D631 Anemia in chronic kidney disease: Secondary | ICD-10-CM | POA: Diagnosis not present

## 2018-07-23 ENCOUNTER — Other Ambulatory Visit: Payer: Self-pay | Admitting: Family Medicine

## 2018-07-23 DIAGNOSIS — N6310 Unspecified lump in the right breast, unspecified quadrant: Secondary | ICD-10-CM

## 2018-07-23 DIAGNOSIS — Z1231 Encounter for screening mammogram for malignant neoplasm of breast: Secondary | ICD-10-CM

## 2018-07-23 DIAGNOSIS — E119 Type 2 diabetes mellitus without complications: Secondary | ICD-10-CM | POA: Diagnosis not present

## 2018-07-23 DIAGNOSIS — N2581 Secondary hyperparathyroidism of renal origin: Secondary | ICD-10-CM | POA: Diagnosis not present

## 2018-07-23 DIAGNOSIS — E1129 Type 2 diabetes mellitus with other diabetic kidney complication: Secondary | ICD-10-CM | POA: Diagnosis not present

## 2018-07-23 DIAGNOSIS — D631 Anemia in chronic kidney disease: Secondary | ICD-10-CM | POA: Diagnosis not present

## 2018-07-23 DIAGNOSIS — N186 End stage renal disease: Secondary | ICD-10-CM | POA: Diagnosis not present

## 2018-07-25 DIAGNOSIS — N186 End stage renal disease: Secondary | ICD-10-CM | POA: Diagnosis not present

## 2018-07-25 DIAGNOSIS — N2581 Secondary hyperparathyroidism of renal origin: Secondary | ICD-10-CM | POA: Diagnosis not present

## 2018-07-25 DIAGNOSIS — D631 Anemia in chronic kidney disease: Secondary | ICD-10-CM | POA: Diagnosis not present

## 2018-07-25 DIAGNOSIS — E1129 Type 2 diabetes mellitus with other diabetic kidney complication: Secondary | ICD-10-CM | POA: Diagnosis not present

## 2018-07-25 DIAGNOSIS — E119 Type 2 diabetes mellitus without complications: Secondary | ICD-10-CM | POA: Diagnosis not present

## 2018-07-27 DIAGNOSIS — N186 End stage renal disease: Secondary | ICD-10-CM | POA: Diagnosis not present

## 2018-07-27 DIAGNOSIS — E1129 Type 2 diabetes mellitus with other diabetic kidney complication: Secondary | ICD-10-CM | POA: Diagnosis not present

## 2018-07-27 DIAGNOSIS — N2581 Secondary hyperparathyroidism of renal origin: Secondary | ICD-10-CM | POA: Diagnosis not present

## 2018-07-27 DIAGNOSIS — E119 Type 2 diabetes mellitus without complications: Secondary | ICD-10-CM | POA: Diagnosis not present

## 2018-07-27 DIAGNOSIS — D631 Anemia in chronic kidney disease: Secondary | ICD-10-CM | POA: Diagnosis not present

## 2018-07-30 DIAGNOSIS — D631 Anemia in chronic kidney disease: Secondary | ICD-10-CM | POA: Diagnosis not present

## 2018-07-30 DIAGNOSIS — E1129 Type 2 diabetes mellitus with other diabetic kidney complication: Secondary | ICD-10-CM | POA: Diagnosis not present

## 2018-07-30 DIAGNOSIS — N2581 Secondary hyperparathyroidism of renal origin: Secondary | ICD-10-CM | POA: Diagnosis not present

## 2018-07-30 DIAGNOSIS — N186 End stage renal disease: Secondary | ICD-10-CM | POA: Diagnosis not present

## 2018-07-30 DIAGNOSIS — E119 Type 2 diabetes mellitus without complications: Secondary | ICD-10-CM | POA: Diagnosis not present

## 2018-08-01 DIAGNOSIS — N2581 Secondary hyperparathyroidism of renal origin: Secondary | ICD-10-CM | POA: Diagnosis not present

## 2018-08-01 DIAGNOSIS — D631 Anemia in chronic kidney disease: Secondary | ICD-10-CM | POA: Diagnosis not present

## 2018-08-01 DIAGNOSIS — E1129 Type 2 diabetes mellitus with other diabetic kidney complication: Secondary | ICD-10-CM | POA: Diagnosis not present

## 2018-08-01 DIAGNOSIS — N186 End stage renal disease: Secondary | ICD-10-CM | POA: Diagnosis not present

## 2018-08-01 DIAGNOSIS — E119 Type 2 diabetes mellitus without complications: Secondary | ICD-10-CM | POA: Diagnosis not present

## 2018-08-02 DIAGNOSIS — N186 End stage renal disease: Secondary | ICD-10-CM | POA: Diagnosis not present

## 2018-08-02 DIAGNOSIS — Z992 Dependence on renal dialysis: Secondary | ICD-10-CM | POA: Diagnosis not present

## 2018-08-02 DIAGNOSIS — I871 Compression of vein: Secondary | ICD-10-CM | POA: Diagnosis not present

## 2018-08-03 DIAGNOSIS — E119 Type 2 diabetes mellitus without complications: Secondary | ICD-10-CM | POA: Diagnosis not present

## 2018-08-03 DIAGNOSIS — N2581 Secondary hyperparathyroidism of renal origin: Secondary | ICD-10-CM | POA: Diagnosis not present

## 2018-08-03 DIAGNOSIS — E1129 Type 2 diabetes mellitus with other diabetic kidney complication: Secondary | ICD-10-CM | POA: Diagnosis not present

## 2018-08-03 DIAGNOSIS — D631 Anemia in chronic kidney disease: Secondary | ICD-10-CM | POA: Diagnosis not present

## 2018-08-03 DIAGNOSIS — N186 End stage renal disease: Secondary | ICD-10-CM | POA: Diagnosis not present

## 2018-08-05 DIAGNOSIS — N186 End stage renal disease: Secondary | ICD-10-CM | POA: Diagnosis not present

## 2018-08-05 DIAGNOSIS — E119 Type 2 diabetes mellitus without complications: Secondary | ICD-10-CM | POA: Diagnosis not present

## 2018-08-05 DIAGNOSIS — N2581 Secondary hyperparathyroidism of renal origin: Secondary | ICD-10-CM | POA: Diagnosis not present

## 2018-08-05 DIAGNOSIS — D631 Anemia in chronic kidney disease: Secondary | ICD-10-CM | POA: Diagnosis not present

## 2018-08-05 DIAGNOSIS — E1129 Type 2 diabetes mellitus with other diabetic kidney complication: Secondary | ICD-10-CM | POA: Diagnosis not present

## 2018-08-08 DIAGNOSIS — N186 End stage renal disease: Secondary | ICD-10-CM | POA: Diagnosis not present

## 2018-08-08 DIAGNOSIS — D631 Anemia in chronic kidney disease: Secondary | ICD-10-CM | POA: Diagnosis not present

## 2018-08-08 DIAGNOSIS — E1129 Type 2 diabetes mellitus with other diabetic kidney complication: Secondary | ICD-10-CM | POA: Diagnosis not present

## 2018-08-08 DIAGNOSIS — E119 Type 2 diabetes mellitus without complications: Secondary | ICD-10-CM | POA: Diagnosis not present

## 2018-08-08 DIAGNOSIS — N2581 Secondary hyperparathyroidism of renal origin: Secondary | ICD-10-CM | POA: Diagnosis not present

## 2018-08-10 DIAGNOSIS — D631 Anemia in chronic kidney disease: Secondary | ICD-10-CM | POA: Diagnosis not present

## 2018-08-10 DIAGNOSIS — N186 End stage renal disease: Secondary | ICD-10-CM | POA: Diagnosis not present

## 2018-08-10 DIAGNOSIS — N2581 Secondary hyperparathyroidism of renal origin: Secondary | ICD-10-CM | POA: Diagnosis not present

## 2018-08-10 DIAGNOSIS — E1129 Type 2 diabetes mellitus with other diabetic kidney complication: Secondary | ICD-10-CM | POA: Diagnosis not present

## 2018-08-10 DIAGNOSIS — E119 Type 2 diabetes mellitus without complications: Secondary | ICD-10-CM | POA: Diagnosis not present

## 2018-08-12 DIAGNOSIS — E1129 Type 2 diabetes mellitus with other diabetic kidney complication: Secondary | ICD-10-CM | POA: Diagnosis not present

## 2018-08-12 DIAGNOSIS — D631 Anemia in chronic kidney disease: Secondary | ICD-10-CM | POA: Diagnosis not present

## 2018-08-12 DIAGNOSIS — E119 Type 2 diabetes mellitus without complications: Secondary | ICD-10-CM | POA: Diagnosis not present

## 2018-08-12 DIAGNOSIS — N186 End stage renal disease: Secondary | ICD-10-CM | POA: Diagnosis not present

## 2018-08-12 DIAGNOSIS — N2581 Secondary hyperparathyroidism of renal origin: Secondary | ICD-10-CM | POA: Diagnosis not present

## 2018-08-13 ENCOUNTER — Other Ambulatory Visit: Payer: Self-pay | Admitting: Family Medicine

## 2018-08-13 DIAGNOSIS — N6310 Unspecified lump in the right breast, unspecified quadrant: Secondary | ICD-10-CM

## 2018-08-13 DIAGNOSIS — Z1231 Encounter for screening mammogram for malignant neoplasm of breast: Secondary | ICD-10-CM

## 2018-08-14 DIAGNOSIS — N040 Nephrotic syndrome with minor glomerular abnormality: Secondary | ICD-10-CM | POA: Diagnosis not present

## 2018-08-14 DIAGNOSIS — Z992 Dependence on renal dialysis: Secondary | ICD-10-CM | POA: Diagnosis not present

## 2018-08-14 DIAGNOSIS — N186 End stage renal disease: Secondary | ICD-10-CM | POA: Diagnosis not present

## 2018-08-15 ENCOUNTER — Institutional Professional Consult (permissible substitution): Payer: Medicare Other | Admitting: Neurology

## 2018-08-15 DIAGNOSIS — E119 Type 2 diabetes mellitus without complications: Secondary | ICD-10-CM | POA: Diagnosis not present

## 2018-08-15 DIAGNOSIS — E1129 Type 2 diabetes mellitus with other diabetic kidney complication: Secondary | ICD-10-CM | POA: Diagnosis not present

## 2018-08-15 DIAGNOSIS — N2581 Secondary hyperparathyroidism of renal origin: Secondary | ICD-10-CM | POA: Diagnosis not present

## 2018-08-15 DIAGNOSIS — N186 End stage renal disease: Secondary | ICD-10-CM | POA: Diagnosis not present

## 2018-08-15 DIAGNOSIS — D509 Iron deficiency anemia, unspecified: Secondary | ICD-10-CM | POA: Diagnosis not present

## 2018-08-15 DIAGNOSIS — D631 Anemia in chronic kidney disease: Secondary | ICD-10-CM | POA: Diagnosis not present

## 2018-08-16 ENCOUNTER — Other Ambulatory Visit: Payer: Self-pay | Admitting: Family Medicine

## 2018-08-16 MED ORDER — ATORVASTATIN CALCIUM 20 MG PO TABS: 20.0000 mg | ORAL_TABLET | Freq: Every day | ORAL | 1 refills | Status: DC

## 2018-08-17 DIAGNOSIS — E1129 Type 2 diabetes mellitus with other diabetic kidney complication: Secondary | ICD-10-CM | POA: Diagnosis not present

## 2018-08-17 DIAGNOSIS — D509 Iron deficiency anemia, unspecified: Secondary | ICD-10-CM | POA: Diagnosis not present

## 2018-08-17 DIAGNOSIS — E119 Type 2 diabetes mellitus without complications: Secondary | ICD-10-CM | POA: Diagnosis not present

## 2018-08-17 DIAGNOSIS — N2581 Secondary hyperparathyroidism of renal origin: Secondary | ICD-10-CM | POA: Diagnosis not present

## 2018-08-17 DIAGNOSIS — N186 End stage renal disease: Secondary | ICD-10-CM | POA: Diagnosis not present

## 2018-08-18 ENCOUNTER — Emergency Department (HOSPITAL_COMMUNITY)
Admission: EM | Admit: 2018-08-18 | Discharge: 2018-08-19 | Disposition: A | Discharge status: Home / Self Care | Payer: Medicare Other | Attending: Emergency Medicine | Admitting: Emergency Medicine

## 2018-08-18 ENCOUNTER — Encounter (HOSPITAL_COMMUNITY): Payer: Self-pay | Admitting: Emergency Medicine

## 2018-08-18 ENCOUNTER — Other Ambulatory Visit: Payer: Self-pay

## 2018-08-18 DIAGNOSIS — E860 Dehydration: Secondary | ICD-10-CM | POA: Insufficient documentation

## 2018-08-18 DIAGNOSIS — Z992 Dependence on renal dialysis: Secondary | ICD-10-CM | POA: Diagnosis not present

## 2018-08-18 DIAGNOSIS — R5383 Other fatigue: Secondary | ICD-10-CM | POA: Diagnosis not present

## 2018-08-18 DIAGNOSIS — N186 End stage renal disease: Secondary | ICD-10-CM | POA: Insufficient documentation

## 2018-08-18 DIAGNOSIS — Z79899 Other long term (current) drug therapy: Secondary | ICD-10-CM | POA: Diagnosis not present

## 2018-08-18 DIAGNOSIS — I951 Orthostatic hypotension: Secondary | ICD-10-CM | POA: Insufficient documentation

## 2018-08-18 DIAGNOSIS — R531 Weakness: Secondary | ICD-10-CM | POA: Diagnosis not present

## 2018-08-18 DIAGNOSIS — I120 Hypertensive chronic kidney disease with stage 5 chronic kidney disease or end stage renal disease: Secondary | ICD-10-CM | POA: Diagnosis not present

## 2018-08-18 DIAGNOSIS — E119 Type 2 diabetes mellitus without complications: Secondary | ICD-10-CM | POA: Insufficient documentation

## 2018-08-18 MED ORDER — SODIUM CHLORIDE 0.9 % IV BOLUS (SEPSIS)
250.0000 mL | Freq: Once | INTRAVENOUS | Status: AC
  Administered 2018-08-19: 250 mL via INTRAVENOUS

## 2018-08-18 NOTE — ED Triage Notes (Signed)
Pt C/O hypotension after taking clonidine 0.1mg  around 1330 this afternoon. Pt states her BP at home was 78/76. Pt C/O dizziness and lightheadedness. Pt is dialysis pt T,Th, Saturday.

## 2018-08-18 NOTE — ED Provider Notes (Signed)
[facility] EMERGENCY DEPARTMENT Provider Note   CSN: [id number] Arrival date & time: 08/18/18  2310     History   Chief Complaint Chief Complaint  Patient presents with  . Hypotension    HPI Megan [name] is a 58 y.o. female.  The history is provided by the patient and the spouse.  Weakness  This is a new problem. The current episode started 6 to 12 hours ago. The problem has been gradually worsening. There was no focality noted. There has been no fever. Associated symptoms include headaches. Pertinent negatives include no shortness of breath, no chest pain, no vomiting and no confusion.  Patient with history of end-stage renal disease on dialysis.  History of hypertension, diabetes. She presents for low blood pressure.  This started on Thursday, January 2.  She reports on her dialysis session she was having episodes of hypotension.  Her nephrologist instructed her to stop taking her clonidine. She underwent dialysis yesterday without any issue. Earlier today she started having headaches and feeling that her blood pressure was elevated.  She decided to restart her clonidine, and then began having episodes of hypotension.  She has alog of her blood pressures and they have dropped in the 70s and 80s. No syncope. She did not take her other evening BP meds.  She has been on these medicines for a while, no acute changes.   Past Medical History:  Diagnosis Date  . Anemia   . CKD (chronic kidney disease) stage 3, GFR 30-59 ml/min (HCC)   . Essential hypertension, benign   . GERD (gastroesophageal reflux disease)   . Headache   . Hemorrhoids   . Mixed hyperlipidemia   . PONV (postoperative nausea and vomiting)   . Renal insufficiency   . S/P colonoscopy Jan 2011   Dr. Benson Norway: sessile polyp (benign lymphoid), large hemorrhoids, repeat 5-10 years  . Temporal arteritis ([place])   . Type 2 diabetes mellitus ([place])   . Wears glasses     Patient Active Problem List   Diagnosis Date  Noted  . Rectal bleeding 06/08/2016  . Vaginal discharge 12/30/2015  . Abdominal pain, epigastric 11/26/2013  . GERD (gastroesophageal reflux disease)   . Type 2 diabetes mellitus ([place])   . CKD (chronic kidney disease) stage 3, GFR 30-59 ml/min (HCC)   . External hemorrhoid 08/01/2011  . Hematochezia 07/13/2011  . Atypical chest pain 04/21/2010  . Mixed hyperlipidemia 03/11/2008  . OBESITY 03/11/2008  . Essential hypertension, benign 03/11/2008    Past Surgical History:  Procedure Laterality Date  . ABDOMINAL HYSTERECTOMY    . APPENDECTOMY    . ARTERY BIOPSY N/A 05/09/2018   Procedure: RIGHT TEMPORAL ARTERY BIOPSY;  Surgeon: Judeth Horn, MD;  Location: [facility];  Service: General;  Laterality: N/A;  . CATARACT EXTRACTION W/PHACO Left 02/09/2017   Procedure: CATARACT EXTRACTION PHACO AND INTRAOCULAR LENS PLACEMENT LEFT EYE;  Surgeon: Tonny Branch, MD;  Location: [facility];  Service: Ophthalmology;  Laterality: Left;  CDE: 4.89  . CATARACT EXTRACTION W/PHACO Right 06/04/2017   Procedure: CATARACT EXTRACTION PHACO AND INTRAOCULAR LENS PLACEMENT (IOC);  Surgeon: Tonny Branch, MD;  Location: [facility];  Service: Ophthalmology;  Laterality: Right;  CDE: 4.12  . CHOLECYSTECTOMY  09/29/2011   Procedure: LAPAROSCOPIC CHOLECYSTECTOMY;  Surgeon: Jamesetta So, MD;  Location: [facility];  Service: General;  Laterality: N/A;  . COLONOSCOPY  Jan 2011   Dr. Benson Norway: sessile polyp (benign lymphoid), large hemorrhoids, repeat 5-10 years  . COLONOSCOPY N/A 06/12/2016  Procedure: COLONOSCOPY;  Surgeon: Daneil Dolin, MD;  [facility];  Service: Endoscopy;  Laterality: N/A;  1230   . ESOPHAGOGASTRODUODENOSCOPY  09/05/2011   YQM:VHQIO hiatal hernia; remainder of exam normal. No explanation for patient's abdominal pain with today's examination  . ESOPHAGOGASTRODUODENOSCOPY N/A 12/17/2013   Dr. [name]: gastric erythema, erosion, mild chronic inflammation on path   . LAPAROSCOPIC APPENDECTOMY  09/29/2011    Procedure: APPENDECTOMY LAPAROSCOPIC;  Surgeon: Jamesetta So, MD;  Location: [facility];  Service: General;;  incidental appendectomy     OB History   No obstetric history on file.      Home Medications    Prior to Admission medications   Medication Sig Start Date End Date Taking? Authorizing Provider  acetaminophen (TYLENOL) 500 MG tablet Take 1,000 mg by mouth every 6 (six) hours as needed.    [provider]  atorvastatin (LIPITOR) 20 MG tablet Take 1 tablet (20 mg total) by mouth daily. 08/16/18   [name], MD  butalbital-acetaminophen-caffeine (FIORICET, ESGIC) 3304557975 MG tablet Take 1 tablet by mouth every 6 (six) hours as needed for headache. 06/04/18 06/04/19  [name], MD  cinacalcet (SENSIPAR) 30 MG tablet Take 2 tablets (60 mg total) by mouth daily. 06/13/18   [name], MD  cloNIDine (CATAPRES) 0.1 MG tablet Take 1 tablet (0.1 mg total) by mouth 2 (two) times daily. 07/17/18   [name], MD  ferric citrate (AURYXIA) 1 GM 210 MG(Fe) tablet Take 210-630 mg by mouth See admin instructions. 3 tablets with meals, and 1 tablet with snacks    [provider]  folic acid-vitamin b complex-vitamin c-selenium-zinc (DIALYVITE) 3 MG TABS tablet Take 1 tablet by mouth daily.    [provider]  gabapentin (NEURONTIN) 300 MG capsule Take 1 capsule (300 mg total) by mouth at bedtime. 06/04/18   [name], MD  losartan (COZAAR) 50 MG tablet Take 1 tablet (50 mg total) by mouth daily. 03/28/18   [name], MD  nebivolol (BYSTOLIC) 10 MG tablet Take 1 tablet (10 mg total) by mouth 2 (two) times daily. 06/13/18   [name], MD  pantoprazole (PROTONIX) 40 MG tablet Take 1 tablet (40 mg total) by mouth daily. 04/23/18   [name], MD  tiZANidine (ZANAFLEX) 4 MG capsule Take 1 capsule (4 mg total) by mouth 2 (two) times daily with a meal. Start 1 capsule at night x 2 weeks and then twice daily 06/17/18   Garvin Fila, MD    Family History Family History  Problem Relation Age of Onset  . Hypertension Mother   . Coronary artery disease Mother   . Diabetes Mother   . Hypertension Sister   . Coronary artery disease Sister   . Hypertension Brother   . Colon cancer Neg Hx     Social History Social History   Tobacco Use  . Smoking status: Never Smoker  . Smokeless tobacco: Never Used  Substance Use Topics  . Alcohol use: No  . Drug use: No     Allergies   Patient has no known allergies.   Review of Systems Review of Systems  Constitutional: Positive for fatigue. Negative for fever.  Respiratory: Negative for shortness of breath.   Cardiovascular: Negative for chest pain.  Gastrointestinal: Negative for blood in stool and vomiting.  Neurological: Positive for weakness and headaches. Negative for syncope and numbness.  Psychiatric/Behavioral: Negative for confusion.  All other systems reviewed  and are negative.    Physical Exam Updated Vital Signs BP 106/63 (BP Location: Right Arm)   Pulse 74   Temp (!) 96 F (35.6 C) (Temporal)   Resp 16   Ht 1.575 m (5\' 2" )   Wt 80.7 kg   SpO2 98%   BMI 32.56 kg/m   Physical Exam  CONSTITUTIONAL: Well developed/well nourished HEAD: Normocephalic/atraumatic EYES: EOMI/PERRL ENMT: Mucous membranes moist NECK: supple no meningeal signs SPINE/BACK:entire spine nontender CV: S1/S2 noted, no murmurs/rubs/gallops noted LUNGS: Lungs are clear to auscultation bilaterally, no apparent distress ABDOMEN: soft, nontender GU:no cva tenderness NEURO: Pt is awake/alert/appropriate, moves all extremitiesx4.  No facial droop.  No arm or leg drift EXTREMITIES: pulses normal/equal, full ROM dialysis access to left arm, thrill noted SKIN: warm, color normal PSYCH: no abnormalities of mood noted, alert and oriented to situation  ED Treatments / Results  Labs (all labs ordered are listed, but only abnormal results are displayed) Labs  Reviewed  BASIC METABOLIC PANEL - Abnormal; Notable for the following components:      Result Value   Potassium 3.3 (*)    Glucose, Bld 115 (*)    BUN 47 (*)    Creatinine, Ser 10.28 (*)    GFR calc non Af Amer 4 (*)    GFR calc Af Amer 4 (*)    All other components within normal limits  CBC WITH DIFFERENTIAL/PLATELET - Abnormal; Notable for the following components:   RBC 3.16 (*)    Hemoglobin 10.7 (*)    HCT 34.2 (*)    MCV 108.2 (*)    All other components within normal limits    EKG EKG Interpretation  Date/Time:  Monday August 19 2018 00:03:05 EST Ventricular Rate:  71 PR Interval:    QRS Duration: 92 QT Interval:  444 QTC Calculation: 483 R Axis:   32 Text Interpretation:  Sinus rhythm Baseline wander in lead(s) V4 Confirmed by Ripley Fraise [phone]) on 08/19/2018 12:08:12 AM   Radiology No results found.  Procedures Procedures (including critical care time)  Medications Ordered in ED Medications  sodium chloride 0.9 % bolus 250 mL (has no administration in time range)     Initial Impression / Assessment and Plan / ED Course  I have reviewed the triage vital signs and the nursing notes.  Pertinent labs & imaging results that were available during my care of the patient were reviewed by me and considered in my medical decision making (see chart for details).     12:01 AM Patient with episodes of hypotension in the setting of multiple blood pressure medications as well as dialysis.  Unclear why she is having these episodes now she has been on these medicines for quite some time.  However does clearly involved with clonidine.  She will need to stop this medications for now.  She does have evidence of orthostatic hypotension.  Will initiate gentle hydration and monitor blood pressure. 1:21 AM Patient dramatically improved.  Blood pressures above 100 and she walked without difficulty. Plan to stop clonidine.  She will need to discuss this with her nephrologist  in the next 24 to 48 hours. Patient and husband agreeable with plan Final Clinical Impressions(s) / ED Diagnoses   Final diagnoses:  Orthostatic hypotension  Dehydration    ED Discharge Orders    None       Ripley Fraise, MD 08/19/18 [phone]

## 2018-08-19 ENCOUNTER — Encounter: Payer: Self-pay | Admitting: Family Medicine

## 2018-08-19 ENCOUNTER — Other Ambulatory Visit: Payer: Self-pay | Admitting: Family Medicine

## 2018-08-19 ENCOUNTER — Ambulatory Visit (INDEPENDENT_AMBULATORY_CARE_PROVIDER_SITE_OTHER): Payer: Medicare Other | Admitting: Family Medicine

## 2018-08-19 VITALS — BP 140/100 | HR 86 | Temp 98.1°F | Resp 14 | Ht 62.0 in | Wt 178.0 lb

## 2018-08-19 DIAGNOSIS — I951 Orthostatic hypotension: Secondary | ICD-10-CM | POA: Diagnosis not present

## 2018-08-19 DIAGNOSIS — Z992 Dependence on renal dialysis: Secondary | ICD-10-CM

## 2018-08-19 DIAGNOSIS — N186 End stage renal disease: Secondary | ICD-10-CM | POA: Diagnosis not present

## 2018-08-19 DIAGNOSIS — I998 Other disorder of circulatory system: Secondary | ICD-10-CM | POA: Diagnosis not present

## 2018-08-19 LAB — CBC WITH DIFFERENTIAL/PLATELET
Abs Immature Granulocytes: 0.03 10*3/uL (ref 0.00–0.07)
Basophils Absolute: 0.1 10*3/uL (ref 0.0–0.1)
Basophils Relative: 1 %
Eosinophils Absolute: 0.2 10*3/uL (ref 0.0–0.5)
Eosinophils Relative: 3 %
HCT: 34.2 % — ABNORMAL LOW (ref 36.0–46.0)
Hemoglobin: 10.7 g/dL — ABNORMAL LOW (ref 12.0–15.0)
Immature Granulocytes: 0 %
Lymphocytes Relative: 31 %
Lymphs Abs: 2.6 10*3/uL (ref 0.7–4.0)
MCH: 33.9 pg (ref 26.0–34.0)
MCHC: 31.3 g/dL (ref 30.0–36.0)
MCV: 108.2 fL — ABNORMAL HIGH (ref 80.0–100.0)
Monocytes Absolute: 1 10*3/uL (ref 0.1–1.0)
Monocytes Relative: 11 %
Neutro Abs: 4.5 10*3/uL (ref 1.7–7.7)
Neutrophils Relative %: 54 %
Platelets: 243 10*3/uL (ref 150–400)
RBC: 3.16 MIL/uL — ABNORMAL LOW (ref 3.87–5.11)
RDW: 14.7 % (ref 11.5–15.5)
WBC: 8.4 10*3/uL (ref 4.0–10.5)
nRBC: 0 % (ref 0.0–0.2)

## 2018-08-19 LAB — BASIC METABOLIC PANEL
Anion gap: 13 (ref 5–15)
BUN: 47 mg/dL — ABNORMAL HIGH (ref 6–20)
CO2: 26 mmol/L (ref 22–32)
Calcium: 9.1 mg/dL (ref 8.9–10.3)
Chloride: 100 mmol/L (ref 98–111)
Creatinine, Ser: 10.28 mg/dL — ABNORMAL HIGH (ref 0.44–1.00)
GFR calc Af Amer: 4 mL/min — ABNORMAL LOW (ref >60)
GFR calc non Af Amer: 4 mL/min — ABNORMAL LOW (ref >60)
Glucose, Bld: 115 mg/dL — ABNORMAL HIGH (ref 70–99)
Potassium: 3.3 mmol/L — ABNORMAL LOW (ref 3.5–5.1)
Sodium: 139 mmol/L (ref 135–145)

## 2018-08-19 MED ORDER — DESIPRAMINE HCL 25 MG PO TABS: 25.0000 mg | ORAL_TABLET | Freq: Every day | ORAL | 1 refills | Status: DC

## 2018-08-19 MED ORDER — AMLODIPINE BESYLATE 5 MG PO TABS: 5.0000 mg | ORAL_TABLET | Freq: Every day | ORAL | 3 refills | Status: DC

## 2018-08-19 NOTE — ED Notes (Signed)
Ambulated pt around nurse's station, pt tolerated ambulation well. Needed no assistance, had no dizziness or drop in VS.   BP 133/79 HR 74

## 2018-08-19 NOTE — Progress Notes (Signed)
Subjective:    Patient ID: Megan [name], female    DOB: [birth year], 58 y.o.   MRN: [id number]  HPI 02/22/18 Patient reports pain on both sides of her neck x8 months.  The pain is located over the trapezius just above the right clavicle mainly.  She is tender to palpation in that area.  She states the pain is also present on the left side of her neck though not as severe.  She originally saw orthopedics who performed an x-ray of the neck and saw no significant abnormalities per her report.  She was then referred to physical therapy and the patient states that physical therapy provided no benefit.  According to the patient, the orthopedist tried her on gabapentin without benefit, they also suggested "shots in her neck" however the patient declined this.  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  She denies any falls or injuries.  She has had occasional episodes of pain radiate from her neck all the way down her right arm into her right hand.  These occur sporadically.  The last occurrence has been more than 2 months ago.  However the pain in the trapezius muscle is constant every day.  At that time, my plan was: Differential diagnosis includes chronic muscle pain in the neck versus cervical radiculopathy versus fibromyalgia.  Patient has trialed and failed gabapentin as well as physical therapy under the care of an orthopedist.  Reportedly x-rays of the cervical spine were normal.  I have recommended a trial of muscle relaxers because I believe the majority of her pain is muscle pain.  I recommended Zanaflex every 6-8 hours as needed for pain and also applying heat when necessary for pain.  If neck pain is not improving at that point, I would recommend an MRI to evaluate for degenerative disc disease or cervical radiculopathy.  If no improvement or pathology is seen on the MRI, the next step then would be a referral to a pain clinic for possible trigger point  injections/Botox injections depending upon the recommendation.  We could also consider Cymbalta for possible fibromyalgia given her relatively benign exam.  04/23/18 Patient presents today with a different complaint.  She reports 2 to 3 weeks of bilateral headache.  The headache is located on both temples.  She describes it as a pulsing severe pain.  Still complains of some mild blurry vision.  Denies any photophobia.  She denies any phonophobia.  She denies any nausea or vomiting.  She denies any head traumas or recent concussion.  Her blood pressure is well controlled at 136/74.  She denies any numbness or tingling anywhere on her body.  She does continue to have neck pain and Zanaflex provided no relief.  She denies any jaw claudication.  She denies any pain with chewing.  Originally on her exam I palpated the TMJ area bilaterally and she states that is where the pain was.  However further on exam, she states is over the temporal artery.  She also reports heartburn unrelieved by Zantac.  Pepcid works but they do not want her to take Pepcid.  At that time, my plan was: Potential diagnosis includes temporal arteritis although the patient's younger than what I would expect for this, tension headache, TMJ, trigeminal neuralgia.  I will start the patient on a prednisone taper pack and obtain a sed rate.  If the sedimentation rate is normal, I would not proceed with a temporal artery biopsy given her age.  If the sedimentation rate is elevated, that would however be the next step.  Meanwhile I will treat the patient empirically with a prednisone taper pack.  If no better, I would switch treatment to muscle relaxers for tension headaches/TMJ.  Await the results of the sed rate and her response to prednisone.  Discontinue Zantac and replace with Protonix 40 mg p.o. every morning with food for recalcitrant acid reflux  05/16/18 Patient sedimentation rate was elevated at 87.  Patient ultimately underwent temporal  artery biopsy to rule out temporal arteritis.  Biopsy revealed no evidence of temporal arteritis and she saw no improvement with the prednisone taper pack for her headache.  She continues to endorse bilateral temporal headache on a daily basis.  It has been constant now for over 6 weeks.  Zanaflex does not help the pain but only makes her sleepy.  She also reports diplopia.  She denies any vomiting but she does report nausea with a headache.  She denies any altered mental status.  She denies any other neurologic deficit.  She denies any confusion.  Headache is nonpulsatile.  There is no photophobia or phonophobia area there is no aura.  She did have migraines as a child.  At that time, my plan was: Given the persistent nature of the headache and the neurologic deficit of diplopia, I will proceed with an MRI of the brain to rule out structural lesions that can cause chronic daily headache and diplopia.  Meanwhile start the patient on Topamax 25 mg p.o. nightly and increase by 25 mg weekly until she is on 50 mg p.o. twice daily in an effort to stop the headaches from happening.  Temporal artery biopsy was negative and therefore I believe temporal arteritis has been adequately removed from the differential diagnosis despite elevated sedimentation rate.  TMJ is certainly still in the differential diagnosis however this would not explain the diplopia.  Atypical migraines also would not explain the diplopia but I think are highly likely.  If Topamax does not help an MRI shows no abnormality, we will consult neurology  06/04/18 MRI was obtained of the brain given the persistent nature of the headaches: IMPRESSION: Mild atrophy.  Mild small vessel disease.  No acute stroke or intracranial hemorrhage.  No evidence for previous cortical infarct or large vessel occlusion to suggest complication of vasculitis. No explanation for the headaches was seen on the MRI.  She continues to have bilateral temporal headaches  as well as a frontal headache.  At times is moderate to severe.  She denies any change in her vision.  Again temporal artery biopsy was negative.  However the patient continues to have the elevated sedimentation rate.  She saw no improvement on a brief prednisone taper pack.  She is seen no improvement on Topamax.  Recently she has developed burning and stinging pain in the balls and toes of both feet.  It feels like it is on fire.  There is no erythema.  There is no warmth.  There is no swelling.  I am unable to reproduce the pain with palpation or range of motion.  Pain sounds neuropathic in nature.  At that time, my plan was: I believe the pain in her feet is likely neuropathic in nature.  Discontinue Topamax and start the patient on gabapentin 300 mg p.o. nightly.  She is on hemodialysis.  I will be hesitant to increase gabapentin beyond 300 mg daily but hopefully this would help with the neuropathic pain.  If it  is beneficial, we could try to increase up to a maximum of 300 mg 3 times a day.    I would also hope that the gabapentin may stop her headaches.  I will give her Fioricet.  She can take 1 tablet every 6 hours as needed for severe headache.  However I am still concerned given her elevated sedimentation rate.  Therefore I will also start the patient on prednisone 60 mg a day as well given the fact she is also having intermittent blurry vision.  This is being done despite the negative temporal artery biopsy.  However I would recheck the patient in 1 week to see if the headache is improving.  If improving in 1 week, we may discontinue gabapentin and Fioricet to see if the headaches return, to determine whether the gabapentin improves the headache or whether the prednisone improve the headache.  However I am concerned given the blurry vision about not adequately treating temporal arteritis immediately.    08/19/18 Recently, the patient's blood pressure has been fluctuating.  She has end-stage renal  disease and is on Tuesday Thursday Saturday dialysis.  On Saturday, her blood pressure has been running low and her nephrologist recommended discontinuing clonidine which the patient had been taking for her blood pressure.  She is also on Bystolic and losartan.  Sunday, her blood pressure spiked to as high as 597 systolic.  This concerned her so she resumed her clonidine.  Afterwards her blood pressure plummeted to 70 systolic making her extremely dizzy and she went to the emergency room.  After gentle rehydration, her systolic blood pressure rose greater than 100 and she was discharged home.  However this morning her blood pressure at home has been running high in the 416L and 845X systolic.  Here she is 140/100.  She is not taking the clonidine again.  Her dialysis is scheduled again for tomorrow Past Medical History:  Diagnosis Date  . Anemia   . CKD (chronic kidney disease) stage 3, GFR 30-59 ml/min (HCC)   . Essential hypertension, benign   . GERD (gastroesophageal reflux disease)   . Headache   . Hemorrhoids   . Mixed hyperlipidemia   . PONV (postoperative nausea and vomiting)   . Renal insufficiency   . S/P colonoscopy Jan 2011   Dr. Benson Norway: sessile polyp (benign lymphoid), large hemorrhoids, repeat 5-10 years  . Temporal arteritis ([place])   . Type 2 diabetes mellitus ([place])   . Wears glasses    Past Surgical History:  Procedure Laterality Date  . ABDOMINAL HYSTERECTOMY    . APPENDECTOMY    . ARTERY BIOPSY N/A 05/09/2018   Procedure: RIGHT TEMPORAL ARTERY BIOPSY;  Surgeon: Judeth Horn, MD;  Location: [facility];  Service: General;  Laterality: N/A;  . CATARACT EXTRACTION W/PHACO Left 02/09/2017   Procedure: CATARACT EXTRACTION PHACO AND INTRAOCULAR LENS PLACEMENT LEFT EYE;  Surgeon: Tonny Branch, MD;  Location: [facility];  Service: Ophthalmology;  Laterality: Left;  CDE: 4.89  . CATARACT EXTRACTION W/PHACO Right 06/04/2017   Procedure: CATARACT EXTRACTION PHACO AND INTRAOCULAR LENS PLACEMENT  (IOC);  Surgeon: Tonny Branch, MD;  Location: [facility];  Service: Ophthalmology;  Laterality: Right;  CDE: 4.12  . CHOLECYSTECTOMY  09/29/2011   Procedure: LAPAROSCOPIC CHOLECYSTECTOMY;  Surgeon: Jamesetta So, MD;  Location: [facility];  Service: General;  Laterality: N/A;  . COLONOSCOPY  Jan 2011   Dr. Benson Norway: sessile polyp (benign lymphoid), large hemorrhoids, repeat 5-10 years  . COLONOSCOPY N/A 06/12/2016   Procedure: COLONOSCOPY;  Surgeon: Daneil Dolin, MD;  Location: [facility];  Service: Endoscopy;  Laterality: N/A;  1230   . ESOPHAGOGASTRODUODENOSCOPY  09/05/2011   [id number] hiatal hernia; remainder of exam normal. No explanation for patient's abdominal pain with today's examination  . ESOPHAGOGASTRODUODENOSCOPY N/A 12/17/2013   Dr. [name]: gastric erythema, erosion, mild chronic inflammation on path   . LAPAROSCOPIC APPENDECTOMY  09/29/2011   Procedure: APPENDECTOMY LAPAROSCOPIC;  Surgeon: Jamesetta So, MD;  Location: [facility];  Service: General;;  incidental appendectomy   Current Outpatient Medications on File Prior to Visit  Medication Sig Dispense Refill  . aspirin EC 81 MG tablet Take 81 mg by mouth daily.    Marland Kitchen atorvastatin (LIPITOR) 20 MG tablet Take 1 tablet (20 mg total) by mouth daily. 90 tablet 1  . B Complex-C-Folic Acid (DIALYVITE 469) 0.8 MG TABS Take by mouth.    . butalbital-acetaminophen-caffeine (FIORICET, ESGIC) 50-325-40 MG tablet Take 1 tablet by mouth every 6 (six) hours as needed for headache. 20 tablet 0  . cinacalcet (SENSIPAR) 30 MG tablet Take 2 tablets (60 mg total) by mouth daily. 90 tablet 0  . folic acid-vitamin b complex-vitamin c-selenium-zinc (DIALYVITE) 3 MG TABS tablet Take 1 tablet by mouth daily.    Marland Kitchen gabapentin (NEURONTIN) 300 MG capsule Take 1 capsule (300 mg total) by mouth at bedtime. 30 capsule 3  . lanthanum (FOSRENOL) 1000 MG chewable tablet CHEW AND SWALLOW 1 TABLET THREE TIMES A DAY WITH MEALS    . losartan (COZAAR) 50 MG tablet Take 1 tablet  (50 mg total) by mouth daily. 90 tablet 3  . nebivolol (BYSTOLIC) 10 MG tablet Take 1 tablet (10 mg total) by mouth 2 (two) times daily. 180 tablet 3  . pantoprazole (PROTONIX) 40 MG tablet TAKE 1 TABLET(40 MG) BY MOUTH DAILY 90 tablet 3  . tiZANidine (ZANAFLEX) 4 MG capsule Take 1 capsule (4 mg total) by mouth 2 (two) times daily with a meal. Start 1 capsule at night x 2 weeks and then twice daily 60 capsule 1   No current facility-administered medications on file prior to visit.    No Known Allergies Social History   Socioeconomic History  . Marital status: Married    Spouse name: Not on file  . Number of children: Not on file  . Years of education: Not on file  . Highest education level: Not on file  Occupational History  . 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 Financial resource strain: Not on file  . Food insecurity:    Worry: Not on file    Inability: Not on file  . Transportation needs:    Medical: Not on file    Non-medical: Not on file  Tobacco Use  . Smoking status: Never Smoker  . Smokeless tobacco: Never Used  Substance and Sexual Activity  . Alcohol use: No  . Drug use: No  . Sexual activity: Yes    Birth control/protection: Surgical  Lifestyle  . Physical activity:    Days per week: Not on file    Minutes per session: Not on file  . Stress: Not on file  Relationships  . Social connections:    Talks on phone: Not on file    Gets together: Not on file    Attends religious service: Not on file    Active member of club or organization: Not on file    Attends meetings of clubs or organizations: Not on  file    Relationship status: Not on file  . Intimate partner violence:    Fear of current or ex partner: Not on file    Emotionally abused: Not on file    Physically abused: Not on file    Forced sexual activity: Not on file  Other Topics Concern  . Not on file  Social History Narrative   Works at [employer] in [place].    When  trucks come, she has to put items in their places.   Also has to get items from high shelves-causes achy pain in shoulder area      Married.   Children are grown, out of house.     Review of Systems  All other systems reviewed and are negative.      Objective:   Physical Exam  Constitutional: She is oriented to person, place, and time. She appears well-developed and well-nourished. No distress.  HENT:  Right Ear: Tympanic membrane, external ear and ear canal normal.  Left Ear: Tympanic membrane, external ear and ear canal normal.  Nose: Nose normal.  Mouth/Throat: Oropharynx is clear and moist. No oropharyngeal exudate.  Eyes: Conjunctivae are normal.  Neck: Neck supple.  Cardiovascular: Normal rate, regular rhythm and normal heart sounds.  Pulmonary/Chest: Effort normal and breath sounds normal. No respiratory distress. She has no wheezes. She has no rales.  Musculoskeletal:     Right foot: Normal range of motion and normal capillary refill. No tenderness, bony tenderness, swelling, crepitus, deformity or laceration.     Left foot: Normal range of motion. No tenderness, bony tenderness, swelling, crepitus, deformity or laceration.  Lymphadenopathy:    She has no cervical adenopathy.  Neurological: She is alert and oriented to person, place, and time. She displays normal reflexes. No cranial nerve deficit. She exhibits normal muscle tone. Coordination normal.  Skin: She is not diaphoretic.  Vitals reviewed.         Assessment & Plan:  Fluctuating blood pressures End stage renal disease hemodialysis dependent  Recommended the patient stay away from the clonidine given the profound drop she is experiencing with it.  Instead we will try amlodipine 5 mg on days in which the patient does not have dialysis when her blood pressure tends to run high.  On days that she has dialysis or if her blood pressure is low she will not take the amlodipine.

## 2018-08-20 ENCOUNTER — Telehealth: Payer: Self-pay | Admitting: Neurology

## 2018-08-20 ENCOUNTER — Ambulatory Visit (HOSPITAL_COMMUNITY)
Admission: RE | Admit: 2018-08-20 | Discharge: 2018-08-20 | Disposition: A | Discharge status: Home / Self Care | Payer: Medicare Other | Source: Ambulatory Visit | Attending: Family Medicine | Admitting: Family Medicine

## 2018-08-20 ENCOUNTER — Other Ambulatory Visit: Payer: Self-pay | Admitting: Family Medicine

## 2018-08-20 DIAGNOSIS — Z1231 Encounter for screening mammogram for malignant neoplasm of breast: Secondary | ICD-10-CM

## 2018-08-20 DIAGNOSIS — N186 End stage renal disease: Secondary | ICD-10-CM | POA: Diagnosis not present

## 2018-08-20 DIAGNOSIS — N6310 Unspecified lump in the right breast, unspecified quadrant: Secondary | ICD-10-CM | POA: Diagnosis present

## 2018-08-20 DIAGNOSIS — E119 Type 2 diabetes mellitus without complications: Secondary | ICD-10-CM | POA: Diagnosis not present

## 2018-08-20 DIAGNOSIS — E1129 Type 2 diabetes mellitus with other diabetic kidney complication: Secondary | ICD-10-CM | POA: Diagnosis not present

## 2018-08-20 DIAGNOSIS — N6312 Unspecified lump in the right breast, upper inner quadrant: Secondary | ICD-10-CM | POA: Diagnosis not present

## 2018-08-20 DIAGNOSIS — N6320 Unspecified lump in the left breast, unspecified quadrant: Secondary | ICD-10-CM | POA: Diagnosis not present

## 2018-08-20 DIAGNOSIS — D509 Iron deficiency anemia, unspecified: Secondary | ICD-10-CM | POA: Diagnosis not present

## 2018-08-20 DIAGNOSIS — N2581 Secondary hyperparathyroidism of renal origin: Secondary | ICD-10-CM | POA: Diagnosis not present

## 2018-08-20 DIAGNOSIS — R921 Mammographic calcification found on diagnostic imaging of breast: Secondary | ICD-10-CM | POA: Diagnosis not present

## 2018-08-20 NOTE — Telephone Encounter (Signed)
Pt is asking if when she is getting her dialysis treatment is it ok for the staff to give her Tylenol.  Please call

## 2018-08-20 NOTE — Telephone Encounter (Signed)
RN call patient that per Dr. Leonie Man note he recommend she discontinue tylenol because it can cause rebound headache. Rn stated his last note recommend heat application, neck exercise. Rn stated he recommend she continues the 4mg  of tizandine at night time. PT states her headaches be increasing during dialysis and on her off days. Rn stated message will be sent to Dr. Leonie Man. PT verbalized understanding.

## 2018-08-20 NOTE — Telephone Encounter (Signed)
Rn call Dr.SEthi to clarify his recommendations on tizandine. Rn stated pt is already on 4mg  at night only. Per Dr.Sethi he recommends she take 4mg  in the am and 4mg  in the pm.

## 2018-08-20 NOTE — Telephone Encounter (Signed)
Advise patient to increase tizanidine to 2 mg in morning and 4 mg at night

## 2018-08-20 NOTE — Telephone Encounter (Signed)
Left vm for patient to call back about Dr.SEthi recommendations.

## 2018-08-21 ENCOUNTER — Other Ambulatory Visit (HOSPITAL_COMMUNITY): Payer: Self-pay | Admitting: Family Medicine

## 2018-08-21 DIAGNOSIS — R928 Other abnormal and inconclusive findings on diagnostic imaging of breast: Secondary | ICD-10-CM

## 2018-08-21 NOTE — Telephone Encounter (Signed)
Rn gave pt Dr.Sethi recommendations for tizanidine. Pt verbalized understanding.

## 2018-08-22 DIAGNOSIS — E119 Type 2 diabetes mellitus without complications: Secondary | ICD-10-CM | POA: Diagnosis not present

## 2018-08-22 DIAGNOSIS — E1129 Type 2 diabetes mellitus with other diabetic kidney complication: Secondary | ICD-10-CM | POA: Diagnosis not present

## 2018-08-22 DIAGNOSIS — D509 Iron deficiency anemia, unspecified: Secondary | ICD-10-CM | POA: Diagnosis not present

## 2018-08-22 DIAGNOSIS — N186 End stage renal disease: Secondary | ICD-10-CM | POA: Diagnosis not present

## 2018-08-22 DIAGNOSIS — N2581 Secondary hyperparathyroidism of renal origin: Secondary | ICD-10-CM | POA: Diagnosis not present

## 2018-08-24 DIAGNOSIS — E119 Type 2 diabetes mellitus without complications: Secondary | ICD-10-CM | POA: Diagnosis not present

## 2018-08-24 DIAGNOSIS — N186 End stage renal disease: Secondary | ICD-10-CM | POA: Diagnosis not present

## 2018-08-24 DIAGNOSIS — D509 Iron deficiency anemia, unspecified: Secondary | ICD-10-CM | POA: Diagnosis not present

## 2018-08-24 DIAGNOSIS — N2581 Secondary hyperparathyroidism of renal origin: Secondary | ICD-10-CM | POA: Diagnosis not present

## 2018-08-24 DIAGNOSIS — E1129 Type 2 diabetes mellitus with other diabetic kidney complication: Secondary | ICD-10-CM | POA: Diagnosis not present

## 2018-08-26 DIAGNOSIS — N186 End stage renal disease: Secondary | ICD-10-CM | POA: Diagnosis not present

## 2018-08-26 DIAGNOSIS — N2581 Secondary hyperparathyroidism of renal origin: Secondary | ICD-10-CM | POA: Diagnosis not present

## 2018-08-26 DIAGNOSIS — E1129 Type 2 diabetes mellitus with other diabetic kidney complication: Secondary | ICD-10-CM | POA: Diagnosis not present

## 2018-08-26 DIAGNOSIS — E119 Type 2 diabetes mellitus without complications: Secondary | ICD-10-CM | POA: Diagnosis not present

## 2018-08-26 DIAGNOSIS — D509 Iron deficiency anemia, unspecified: Secondary | ICD-10-CM | POA: Diagnosis not present

## 2018-08-27 ENCOUNTER — Ambulatory Visit (HOSPITAL_COMMUNITY): Payer: Medicare Other

## 2018-08-27 ENCOUNTER — Ambulatory Visit (HOSPITAL_COMMUNITY)
Admission: RE | Admit: 2018-08-27 | Discharge: 2018-08-27 | Disposition: A | Discharge status: Home / Self Care | Payer: Medicare Other | Source: Ambulatory Visit | Attending: Family Medicine | Admitting: Family Medicine

## 2018-08-27 ENCOUNTER — Other Ambulatory Visit (HOSPITAL_COMMUNITY): Payer: Self-pay | Admitting: Family Medicine

## 2018-08-27 ENCOUNTER — Encounter (HOSPITAL_COMMUNITY): Payer: Self-pay

## 2018-08-27 DIAGNOSIS — R928 Other abnormal and inconclusive findings on diagnostic imaging of breast: Secondary | ICD-10-CM | POA: Insufficient documentation

## 2018-08-27 DIAGNOSIS — C50211 Malignant neoplasm of upper-inner quadrant of right female breast: Secondary | ICD-10-CM | POA: Diagnosis not present

## 2018-08-27 DIAGNOSIS — Z1231 Encounter for screening mammogram for malignant neoplasm of breast: Secondary | ICD-10-CM | POA: Insufficient documentation

## 2018-08-27 DIAGNOSIS — N6312 Unspecified lump in the right breast, upper inner quadrant: Secondary | ICD-10-CM | POA: Diagnosis not present

## 2018-08-27 DIAGNOSIS — N6310 Unspecified lump in the right breast, unspecified quadrant: Secondary | ICD-10-CM | POA: Insufficient documentation

## 2018-08-27 MED ORDER — LIDOCAINE-EPINEPHRINE (PF) 1 %-1:200000 IJ SOLN
INTRAMUSCULAR | Status: AC
  Administered 2018-08-27: 8 mL
  Filled 2018-08-27: qty 30

## 2018-08-27 MED ORDER — LIDOCAINE HCL (PF) 1 % IJ SOLN
INTRAMUSCULAR | Status: AC
  Administered 2018-08-27: 2 mL
  Filled 2018-08-27: qty 5

## 2018-08-29 ENCOUNTER — Other Ambulatory Visit (HOSPITAL_COMMUNITY): Payer: Self-pay | Admitting: Nephrology

## 2018-08-29 ENCOUNTER — Other Ambulatory Visit: Payer: Self-pay | Admitting: Nephrology

## 2018-08-29 DIAGNOSIS — E1129 Type 2 diabetes mellitus with other diabetic kidney complication: Secondary | ICD-10-CM | POA: Diagnosis not present

## 2018-08-29 DIAGNOSIS — N186 End stage renal disease: Secondary | ICD-10-CM | POA: Diagnosis not present

## 2018-08-29 DIAGNOSIS — D509 Iron deficiency anemia, unspecified: Secondary | ICD-10-CM | POA: Diagnosis not present

## 2018-08-29 DIAGNOSIS — E119 Type 2 diabetes mellitus without complications: Secondary | ICD-10-CM | POA: Diagnosis not present

## 2018-08-29 DIAGNOSIS — N2581 Secondary hyperparathyroidism of renal origin: Secondary | ICD-10-CM | POA: Diagnosis not present

## 2018-08-29 DIAGNOSIS — E039 Hypothyroidism, unspecified: Secondary | ICD-10-CM | POA: Diagnosis not present

## 2018-08-29 DIAGNOSIS — Z01810 Encounter for preprocedural cardiovascular examination: Secondary | ICD-10-CM

## 2018-08-31 DIAGNOSIS — N2581 Secondary hyperparathyroidism of renal origin: Secondary | ICD-10-CM | POA: Diagnosis not present

## 2018-08-31 DIAGNOSIS — N186 End stage renal disease: Secondary | ICD-10-CM | POA: Diagnosis not present

## 2018-08-31 DIAGNOSIS — E119 Type 2 diabetes mellitus without complications: Secondary | ICD-10-CM | POA: Diagnosis not present

## 2018-08-31 DIAGNOSIS — E1129 Type 2 diabetes mellitus with other diabetic kidney complication: Secondary | ICD-10-CM | POA: Diagnosis not present

## 2018-08-31 DIAGNOSIS — D509 Iron deficiency anemia, unspecified: Secondary | ICD-10-CM | POA: Diagnosis not present

## 2018-09-03 ENCOUNTER — Telehealth: Payer: Self-pay | Admitting: General Surgery

## 2018-09-03 ENCOUNTER — Ambulatory Visit: Payer: Medicare Other | Admitting: General Surgery

## 2018-09-03 ENCOUNTER — Telehealth (HOSPITAL_COMMUNITY): Payer: Self-pay | Admitting: *Deleted

## 2018-09-03 DIAGNOSIS — N2581 Secondary hyperparathyroidism of renal origin: Secondary | ICD-10-CM | POA: Diagnosis not present

## 2018-09-03 DIAGNOSIS — D509 Iron deficiency anemia, unspecified: Secondary | ICD-10-CM | POA: Diagnosis not present

## 2018-09-03 DIAGNOSIS — E119 Type 2 diabetes mellitus without complications: Secondary | ICD-10-CM | POA: Diagnosis not present

## 2018-09-03 DIAGNOSIS — E1129 Type 2 diabetes mellitus with other diabetic kidney complication: Secondary | ICD-10-CM | POA: Diagnosis not present

## 2018-09-03 DIAGNOSIS — N186 End stage renal disease: Secondary | ICD-10-CM | POA: Diagnosis not present

## 2018-09-03 NOTE — Telephone Encounter (Signed)
Patient given detailed instructions per Myocardial Perfusion Study Information Sheet for the test on 09/06/18 at 0800. Patient notified to arrive 15 minutes early and that it is imperative to arrive on time for appointment to keep from having the test rescheduled.  If you need to cancel or reschedule your appointment, please call the office within 24 hours of your appointment. . Patient verbalized understanding.Megan [name], [place] No myChart

## 2018-09-03 NOTE — Telephone Encounter (Signed)
Discussed patient's recent diagnosis of invasive mammary breast cancer with the [facility] transplant service.  They most likely will take over the care of her newly diagnosed cancer as she is on a kidney transplant list.  Patient was notified of this.

## 2018-09-03 NOTE — Telephone Encounter (Signed)
Thanks for letting me know.   Megan [name]

## 2018-09-05 ENCOUNTER — Encounter: Payer: Self-pay | Admitting: General Surgery

## 2018-09-05 ENCOUNTER — Ambulatory Visit (INDEPENDENT_AMBULATORY_CARE_PROVIDER_SITE_OTHER): Payer: Medicare Other | Admitting: General Surgery

## 2018-09-05 ENCOUNTER — Other Ambulatory Visit (HOSPITAL_COMMUNITY): Payer: Self-pay | Admitting: General Surgery

## 2018-09-05 VITALS — BP 168/101 | HR 96 | Temp 96.9°F | Resp 18 | Wt 173.6 lb

## 2018-09-05 DIAGNOSIS — C50211 Malignant neoplasm of upper-inner quadrant of right female breast: Secondary | ICD-10-CM | POA: Diagnosis not present

## 2018-09-05 DIAGNOSIS — N2581 Secondary hyperparathyroidism of renal origin: Secondary | ICD-10-CM | POA: Diagnosis not present

## 2018-09-05 DIAGNOSIS — E1129 Type 2 diabetes mellitus with other diabetic kidney complication: Secondary | ICD-10-CM | POA: Diagnosis not present

## 2018-09-05 DIAGNOSIS — D509 Iron deficiency anemia, unspecified: Secondary | ICD-10-CM | POA: Diagnosis not present

## 2018-09-05 DIAGNOSIS — N186 End stage renal disease: Secondary | ICD-10-CM | POA: Diagnosis not present

## 2018-09-05 DIAGNOSIS — E119 Type 2 diabetes mellitus without complications: Secondary | ICD-10-CM | POA: Diagnosis not present

## 2018-09-05 NOTE — Patient Instructions (Signed)
Surgical Options for Early-Stage Breast Cancer  Surgery is usually the first treatment for early-stage breast cancer. Most women have two surgery options. One is called partial mastectomy, or breast-sparing or breast-conserving surgery, and the other is called mastectomy. Both surgeries have good survival rates. Breast cancer is different for everyone, even in its early stage. The best treatment for one person might not be the best treatment for another. Learn as much as you can about your cancer and work closely with your health care providers to make the choice that produces the best results for you. What is partial mastectomy? Partial mastectomy, also called breast-sparing surgery or breast-conserving surgery, is surgery to remove the cancer along with some normal breast tissue that surrounds it. Lymph nodes from under the arm may also be removed and tested to find out if the cancer has spread. If cancer is located near the chest wall, part of the chest wall lining may also be removed. What is a mastectomy? A mastectomy is surgery to remove the cancer along with the entire breast tissue. There are several types of mastectomy:  Simple or total mastectomy. In this surgery the entire breast is removed, including breast tissue, nipple, areola and skin around the breast. Some lymph nodes may also be removed from under the arm. If cancer is located near the chest wall, part of the chest wall lining may also be removed.  Skin-sparing mastectomy. In this surgery the breast tissue, nipple, and areola are removed and most of the skin over the breast is left in place. This surgery results in less scar tissue than other mastectomy surgeries, which allows for a more natural breast reconstruction.  Nipple-sparing mastectomy. In this surgery, breast tissue is removed but the skin and nipple is left in place. The tissue under the nipple and areola may be removed if cancer is found in the area. This may be an option  for women who choose to have breast reconstruction after mastectomy.  Modified radical mastectomy. This surgery is the same as a simple mastectomy but also includes removing lymph nodes from under the arm (axillary lymph node dissection).  Radical mastectomy. In this surgery the entire breast, the lymph nodes under the arm, and the chest wall muscles under the breast are removed. This surgery is rarely done now. A modified radical mastectomy is preferred because it is just as effective, but with the added advantage of fewer side effects. What are some advantages and disadvantages of these surgeries? Partial mastectomy Advantages of partial mastectomy include:  Keeping most of your breast tissue intact, allowing for a more natural look to the breast.  Easier recovery when compared to a mastectomy.  Ability to go home on the day of the procedure. Disadvantages of partial mastectomy include:  Slightly higher risk that your cancer will come back.  Needing more surgery at a later time.  Requiring radiation therapy after surgery, which has side effects and possible complications. This is done to reduce the chances of breast cancer returning. Mastectomy Advantages of a mastectomy include:  Not needing to have radiation therapy or other treatments after surgery.  Lower chances of your cancer coming back. Disadvantages of a mastectomy include:  Longer recovery time compared to partial mastectomy.  Possibility of more complications.  Requiring additional surgeries to reconstruct your breast. Where to find more information  [place]: https://www.cancer.gov  American Cancer Society: http://www.cancer.org Questions to ask Here are some questions to ask about each surgery:  What will my recovery be  like?  How will my breast look and feel?  What are the possible risks and complications of the surgery?  What additional treatment might I need after surgery?  What are  the risks and complications of radiation therapy?  What are the risks and complications of chemotherapy?  Will I be able to have breast reconstruction? Summary  Surgery is usually the first treatment for early-stage breast cancer. Most women have two surgery options.  One option is called partial mastectomy, or breast-sparing or breast-conserving surgery, and the other is called mastectomy. Both surgeries have good survival rates.  Each option has advantages and disadvantages to consider. The best treatment for one person might not be the best treatment for you.  Learn as much as you can about your cancer and work closely with your health care providers to make the choice that produces the best results for you. This information is not intended to replace advice given to you by your health care provider. Make sure you discuss any questions you have with your health care provider. Document Released: 10/21/2003 Document Revised: 10/26/2016 Document Reviewed: 10/26/2016 Elsevier Interactive Patient Education  2019 [employer].

## 2018-09-06 ENCOUNTER — Ambulatory Visit (HOSPITAL_COMMUNITY): Payer: Medicare Other | Attending: Cardiovascular Disease

## 2018-09-06 VITALS — Ht 62.0 in | Wt 178.0 lb

## 2018-09-06 DIAGNOSIS — Z01810 Encounter for preprocedural cardiovascular examination: Secondary | ICD-10-CM | POA: Insufficient documentation

## 2018-09-06 DIAGNOSIS — R110 Nausea: Secondary | ICD-10-CM | POA: Insufficient documentation

## 2018-09-06 LAB — MYOCARDIAL PERFUSION IMAGING
LV dias vol: 76 mL (ref 46–106)
LV sys vol: 31 mL
Peak HR: 155 {beats}/min
Rest HR: 82 {beats}/min
SDS: 1
SRS: 3
SSS: 4
TID: 1.16

## 2018-09-06 MED ORDER — REGADENOSON 0.4 MG/5ML IV SOLN
0.4000 mg | Freq: Once | INTRAVENOUS | Status: AC
  Administered 2018-09-06: 0.4 mg via INTRAVENOUS

## 2018-09-06 MED ORDER — TECHNETIUM TC 99M TETROFOSMIN IV KIT
9.8000 | PACK | Freq: Once | INTRAVENOUS | Status: AC | PRN
  Administered 2018-09-06: 9.8 via INTRAVENOUS
  Filled 2018-09-06: qty 10

## 2018-09-06 MED ORDER — TECHNETIUM TC 99M TETROFOSMIN IV KIT
31.8000 | PACK | Freq: Once | INTRAVENOUS | Status: AC | PRN
  Administered 2018-09-06: 31.8 via INTRAVENOUS
  Filled 2018-09-06: qty 32

## 2018-09-06 MED ORDER — AMINOPHYLLINE 25 MG/ML IV SOLN
150.0000 mg | Freq: Once | INTRAVENOUS | Status: AC
  Administered 2018-09-06: 150 mg via INTRAVENOUS

## 2018-09-06 NOTE — H&P (Signed)
Megan [name]; [id number]; [birth year]   HPI Patient is a 58 year old black female who was recently diagnosed on screening mammography to have an invasive carcinoma of the right breast.  She has no family history of breast cancer.  She does not feel a lump.  She does not have any nipple discharge.  She denies any breast pain.  She is on the kidney transplant list at [facility]. Past Medical History:  Diagnosis Date  . Anemia   . CKD (chronic kidney disease) stage 3, GFR 30-59 ml/min (HCC)   . Essential hypertension, benign   . GERD (gastroesophageal reflux disease)   . Headache   . Hemorrhoids   . Mixed hyperlipidemia   . PONV (postoperative nausea and vomiting)   . Renal insufficiency   . S/P colonoscopy Jan 2011   Dr. Benson Norway: sessile polyp (benign lymphoid), large hemorrhoids, repeat 5-10 years  . Temporal arteritis ([place])   . Type 2 diabetes mellitus ([place])   . Wears glasses     Past Surgical History:  Procedure Laterality Date  . ABDOMINAL HYSTERECTOMY    . APPENDECTOMY    . ARTERY BIOPSY N/A 05/09/2018   Procedure: RIGHT TEMPORAL ARTERY BIOPSY;  Surgeon: Judeth Horn, MD;  Location: [facility];  Service: General;  Laterality: N/A;  . CATARACT EXTRACTION W/PHACO Left 02/09/2017   Procedure: CATARACT EXTRACTION PHACO AND INTRAOCULAR LENS PLACEMENT LEFT EYE;  Surgeon: Tonny Branch, MD;  [facility]: [facility];  Service: Ophthalmology;  Laterality: Left;  CDE: 4.89  . CATARACT EXTRACTION W/PHACO Right 06/04/2017   Procedure: CATARACT EXTRACTION PHACO AND INTRAOCULAR LENS PLACEMENT (IOC);  Surgeon: Tonny Branch, MD;  [facility]: [facility];  Service: Ophthalmology;  Laterality: Right;  CDE: 4.12  . CHOLECYSTECTOMY  09/29/2011   Procedure: LAPAROSCOPIC CHOLECYSTECTOMY;  Surgeon: Jamesetta So, MD;  [facility]: [facility];  Service: General;  Laterality: N/A;  . COLONOSCOPY  Jan 2011   Dr. Benson Norway: sessile polyp (benign lymphoid), large hemorrhoids, repeat 5-10 years  . COLONOSCOPY N/A  06/12/2016   Procedure: COLONOSCOPY;  Surgeon: Daneil Dolin, MD;  [facility];  Service: Endoscopy;  Laterality: N/A;  1230   . ESOPHAGOGASTRODUODENOSCOPY  09/05/2011   [id number] hiatal hernia; remainder of exam normal. No explanation for patient's abdominal pain with today's examination  . ESOPHAGOGASTRODUODENOSCOPY N/A 12/17/2013   Dr. [name]: gastric erythema, erosion, mild chronic inflammation on path   . LAPAROSCOPIC APPENDECTOMY  09/29/2011   Procedure: APPENDECTOMY LAPAROSCOPIC;  Surgeon: Jamesetta So, MD;  [facility]: [facility];  Service: General;;  incidental appendectomy    Family History  Problem Relation Age of Onset  . Hypertension Mother   . Coronary artery disease Mother   . Diabetes Mother   . Hypertension Sister   . Coronary artery disease Sister   . Hypertension Brother   . Colon cancer Neg Hx     Current Outpatient Medications on File Prior to Visit  Medication Sig Dispense Refill  . amLODipine (NORVASC) 5 MG tablet Take 1 tablet (5 mg total) by mouth daily. 90 tablet 3  . aspirin EC 81 MG tablet Take 81 mg by mouth daily.    Marland Kitchen atorvastatin (LIPITOR) 20 MG tablet Take 1 tablet (20 mg total) by mouth daily. 90 tablet 1  . B Complex-C-Folic Acid (DIALYVITE 638) 0.8 MG TABS Take by mouth.    . butalbital-acetaminophen-caffeine (FIORICET, ESGIC) 50-325-40 MG tablet Take 1 tablet by mouth every 6 (six) hours as needed for headache. 20 tablet 0  .  cinacalcet (SENSIPAR) 30 MG tablet Take 2 tablets (60 mg total) by mouth daily. 90 tablet 0  . desipramine (NORPRAMIN) 25 MG tablet Take 1 tablet (25 mg total) by mouth daily. 30 tablet 1  . folic acid-vitamin b complex-vitamin c-selenium-zinc (DIALYVITE) 3 MG TABS tablet Take 1 tablet by mouth daily.    Marland Kitchen gabapentin (NEURONTIN) 300 MG capsule Take 1 capsule (300 mg total) by mouth at bedtime. 30 capsule 3  . lanthanum (FOSRENOL) 1000 MG chewable tablet CHEW AND SWALLOW 1 TABLET THREE TIMES A DAY WITH MEALS    . losartan  (COZAAR) 50 MG tablet Take 1 tablet (50 mg total) by mouth daily. 90 tablet 3  . nebivolol (BYSTOLIC) 10 MG tablet Take 1 tablet (10 mg total) by mouth 2 (two) times daily. 180 tablet 3  . pantoprazole (PROTONIX) 40 MG tablet TAKE 1 TABLET(40 MG) BY MOUTH DAILY 90 tablet 3  . tiZANidine (ZANAFLEX) 4 MG capsule Take 1 capsule (4 mg total) by mouth 2 (two) times daily with a meal. Start 1 capsule at night x 2 weeks and then twice daily 60 capsule 1   No current facility-administered medications on file prior to visit.     No Known Allergies  Social History   Substance and Sexual Activity  Alcohol Use No    Social History   Tobacco Use  Smoking Status Never Smoker  Smokeless Tobacco Never Used    Review of Systems  Constitutional: Negative.   HENT: Negative.   Eyes: Negative.   Respiratory: Negative.   Cardiovascular: Negative.   Gastrointestinal: Negative.   Genitourinary: Negative.   Musculoskeletal: Negative.   Skin: Negative.   Neurological: Positive for headaches.  Endo/Heme/Allergies: Negative.   Psychiatric/Behavioral: Negative.     Objective   Vitals:   09/05/18 1035  BP: (!) 168/101  Pulse: 96  Resp: 18  Temp: (!) 96.9 F (36.1 C)    Physical Exam Vitals signs reviewed.  Constitutional:      Appearance: Normal appearance. She is not ill-appearing.  HENT:     Head: Normocephalic and atraumatic.  Neck:     Musculoskeletal: Normal range of motion and neck supple.  Cardiovascular:     Rate and Rhythm: Normal rate and regular rhythm.     Heart sounds: Normal heart sounds. No murmur. No friction rub. No gallop.   Pulmonary:     Effort: Pulmonary effort is normal. No respiratory distress.     Breath sounds: Normal breath sounds. No stridor. No wheezing, rhonchi or rales.  Lymphadenopathy:     Cervical: No cervical adenopathy.  Skin:    General: Skin is warm and dry.  Neurological:     Mental Status: She is alert and oriented to person, place, and  time.   Breast: No dominant mass, nipple discharge, or dimpling in either breast.  Axillas are negative for palpable nodes.  Mammography, ultrasound, and final pathology reports reviewed  Assessment  Invasive ductal carcinoma with surrounding DCIS, right breast Plan   I discussed surgical options with the patient including modified radical mastectomy versus partial mastectomy, sentinel lymph node biopsy, with postoperative radiation therapy.  Patient has elected to proceed with the latter.  We will proceed with a right partial mastectomy after needle localization, sentinel lymph node biopsy, possible axillary dissection on 09/18/2018.  The risks and benefits of the procedures including bleeding, infection, and the possibility of return to the operating room for unclear margins were fully explained to the patient, who gave informed consent.  I did discuss her situation with the transplant service at [facility].  They are aware of the situation and will monitor her course after surgery.

## 2018-09-06 NOTE — Progress Notes (Signed)
Megan [name]; [id number]; 12/16/1960   HPI Patient is a 58 year old black female who was recently diagnosed on screening mammography to have an invasive carcinoma of the right breast.  She has no family history of breast cancer.  She does not feel a lump.  She does not have any nipple discharge.  She denies any breast pain.  She is on the kidney transplant list at [facility]. Past Medical History:  Diagnosis Date  . Anemia   . CKD (chronic kidney disease) stage 3, GFR 30-59 ml/min (HCC)   . Essential hypertension, benign   . GERD (gastroesophageal reflux disease)   . Headache   . Hemorrhoids   . Mixed hyperlipidemia   . PONV (postoperative nausea and vomiting)   . Renal insufficiency   . S/P colonoscopy Jan 2011   Dr. Benson Norway: sessile polyp (benign lymphoid), large hemorrhoids, repeat 5-10 years  . Temporal arteritis ([place])   . Type 2 diabetes mellitus ([place])   . Wears glasses     Past Surgical History:  Procedure Laterality Date  . ABDOMINAL HYSTERECTOMY    . APPENDECTOMY    . ARTERY BIOPSY N/A 05/09/2018   Procedure: RIGHT TEMPORAL ARTERY BIOPSY;  Surgeon: Judeth Horn, MD;  Location: [facility];  Service: General;  Laterality: N/A;  . CATARACT EXTRACTION W/PHACO Left 02/09/2017   Procedure: CATARACT EXTRACTION PHACO AND INTRAOCULAR LENS PLACEMENT LEFT EYE;  Surgeon: Tonny Branch, MD;  [facility]: [facility];  Service: Ophthalmology;  Laterality: Left;  CDE: 4.89  . CATARACT EXTRACTION W/PHACO Right 06/04/2017   Procedure: CATARACT EXTRACTION PHACO AND INTRAOCULAR LENS PLACEMENT (IOC);  Surgeon: Tonny Branch, MD;  [facility]: [facility];  Service: Ophthalmology;  Laterality: Right;  CDE: 4.12  . CHOLECYSTECTOMY  09/29/2011   Procedure: LAPAROSCOPIC CHOLECYSTECTOMY;  Surgeon: Jamesetta So, MD;  [facility]: [facility];  Service: General;  Laterality: N/A;  . COLONOSCOPY  Jan 2011   Dr. Benson Norway: sessile polyp (benign lymphoid), large hemorrhoids, repeat 5-10 years  . COLONOSCOPY N/A  06/12/2016   Procedure: COLONOSCOPY;  Surgeon: Daneil Dolin, MD;  [facility];  Service: Endoscopy;  Laterality: N/A;  1230   . ESOPHAGOGASTRODUODENOSCOPY  09/05/2011   NW[facility]:[id number] hiatal hernia; remainder of exam normal. No explanation for patient's abdominal pain with today's examination  . ESOPHAGOGASTRODUODENOSCOPY N/A 12/17/2013   Dr. [name]: gastric erythema, erosion, mild chronic inflammation on path   . LAPAROSCOPIC APPENDECTOMY  09/29/2011   Procedure: APPENDECTOMY LAPAROSCOPIC;  Surgeon: Jamesetta So, MD;  Location: [facility];  Service: General;;  incidental appendectomy    Family History  Problem Relation Age of Onset  . Hypertension Mother   . Coronary artery disease Mother   . Diabetes Mother   . Hypertension Sister   . Coronary artery disease Sister   . Hypertension Brother   . Colon cancer Neg Hx     Current Outpatient Medications on File Prior to Visit  Medication Sig Dispense Refill  . amLODipine (NORVASC) 5 MG tablet Take 1 tablet (5 mg total) by mouth daily. 90 tablet 3  . aspirin EC 81 MG tablet Take 81 mg by mouth daily.    Marland Kitchen atorvastatin (LIPITOR) 20 MG tablet Take 1 tablet (20 mg total) by mouth daily. 90 tablet 1  . B Complex-C-Folic Acid (DIALYVITE 308) 0.8 MG TABS Take by mouth.    . butalbital-acetaminophen-caffeine (FIORICET, ESGIC) 50-325-40 MG tablet Take 1 tablet by mouth every 6 (six) hours as needed for headache. 20 tablet 0  .  cinacalcet (SENSIPAR) 30 MG tablet Take 2 tablets (60 mg total) by mouth daily. 90 tablet 0  . desipramine (NORPRAMIN) 25 MG tablet Take 1 tablet (25 mg total) by mouth daily. 30 tablet 1  . folic acid-vitamin b complex-vitamin c-selenium-zinc (DIALYVITE) 3 MG TABS tablet Take 1 tablet by mouth daily.    Marland Kitchen gabapentin (NEURONTIN) 300 MG capsule Take 1 capsule (300 mg total) by mouth at bedtime. 30 capsule 3  . lanthanum (FOSRENOL) 1000 MG chewable tablet CHEW AND SWALLOW 1 TABLET THREE TIMES A DAY WITH MEALS    . losartan  (COZAAR) 50 MG tablet Take 1 tablet (50 mg total) by mouth daily. 90 tablet 3  . nebivolol (BYSTOLIC) 10 MG tablet Take 1 tablet (10 mg total) by mouth 2 (two) times daily. 180 tablet 3  . pantoprazole (PROTONIX) 40 MG tablet TAKE 1 TABLET(40 MG) BY MOUTH DAILY 90 tablet 3  . tiZANidine (ZANAFLEX) 4 MG capsule Take 1 capsule (4 mg total) by mouth 2 (two) times daily with a meal. Start 1 capsule at night x 2 weeks and then twice daily 60 capsule 1   No current facility-administered medications on file prior to visit.     No Known Allergies  Social History   Substance and Sexual Activity  Alcohol Use No    Social History   Tobacco Use  Smoking Status Never Smoker  Smokeless Tobacco Never Used    Review of Systems  Constitutional: Negative.   HENT: Negative.   Eyes: Negative.   Respiratory: Negative.   Cardiovascular: Negative.   Gastrointestinal: Negative.   Genitourinary: Negative.   Musculoskeletal: Negative.   Skin: Negative.   Neurological: Positive for headaches.  Endo/Heme/Allergies: Negative.   Psychiatric/Behavioral: Negative.     Objective   Vitals:   09/05/18 1035  BP: (!) 168/101  Pulse: 96  Resp: 18  Temp: (!) 96.9 F (36.1 C)    Physical Exam Vitals signs reviewed.  Constitutional:      Appearance: Normal appearance. She is not ill-appearing.  HENT:     Head: Normocephalic and atraumatic.  Neck:     Musculoskeletal: Normal range of motion and neck supple.  Cardiovascular:     Rate and Rhythm: Normal rate and regular rhythm.     Heart sounds: Normal heart sounds. No murmur. No friction rub. No gallop.   Pulmonary:     Effort: Pulmonary effort is normal. No respiratory distress.     Breath sounds: Normal breath sounds. No stridor. No wheezing, rhonchi or rales.  Lymphadenopathy:     Cervical: No cervical adenopathy.  Skin:    General: Skin is warm and dry.  Neurological:     Mental Status: She is alert and oriented to person, place, and  time.   Breast: No dominant mass, nipple discharge, or dimpling in either breast.  Axillas are negative for palpable nodes.  Mammography, ultrasound, and final pathology reports reviewed  Assessment  Invasive ductal carcinoma with surrounding DCIS, right breast Plan   I discussed surgical options with the patient including modified radical mastectomy versus partial mastectomy, sentinel lymph node biopsy, with postoperative radiation therapy.  Patient has elected to proceed with the latter.  We will proceed with a right partial mastectomy after needle localization, sentinel lymph node biopsy, possible axillary dissection on 09/18/2018.  The risks and benefits of the procedures including bleeding, infection, and the possibility of return to the operating room for unclear margins were fully explained to the patient, who gave informed consent.  I did discuss her situation with the transplant service at [facility].  They are aware of the situation and will monitor her course after surgery.

## 2018-09-07 DIAGNOSIS — D509 Iron deficiency anemia, unspecified: Secondary | ICD-10-CM | POA: Diagnosis not present

## 2018-09-07 DIAGNOSIS — N186 End stage renal disease: Secondary | ICD-10-CM | POA: Diagnosis not present

## 2018-09-07 DIAGNOSIS — N2581 Secondary hyperparathyroidism of renal origin: Secondary | ICD-10-CM | POA: Diagnosis not present

## 2018-09-07 DIAGNOSIS — E1129 Type 2 diabetes mellitus with other diabetic kidney complication: Secondary | ICD-10-CM | POA: Diagnosis not present

## 2018-09-07 DIAGNOSIS — E119 Type 2 diabetes mellitus without complications: Secondary | ICD-10-CM | POA: Diagnosis not present

## 2018-09-10 DIAGNOSIS — N186 End stage renal disease: Secondary | ICD-10-CM | POA: Diagnosis not present

## 2018-09-10 DIAGNOSIS — E1129 Type 2 diabetes mellitus with other diabetic kidney complication: Secondary | ICD-10-CM | POA: Diagnosis not present

## 2018-09-10 DIAGNOSIS — D509 Iron deficiency anemia, unspecified: Secondary | ICD-10-CM | POA: Diagnosis not present

## 2018-09-10 DIAGNOSIS — E119 Type 2 diabetes mellitus without complications: Secondary | ICD-10-CM | POA: Diagnosis not present

## 2018-09-10 DIAGNOSIS — N2581 Secondary hyperparathyroidism of renal origin: Secondary | ICD-10-CM | POA: Diagnosis not present

## 2018-09-10 NOTE — Patient Instructions (Signed)
Your procedure is scheduled on: 09/18/2018  Report to [facility] at   9:30  AM.  Call this number if you have problems the morning of surgery: [phone]   Remember:   Do not drink or eat food:After Midnight.  :  Take these medicines the morning of surgery with A SIP OF WATER: Amlodipine, Cinacalcet, Gabapentin, losartin, Bystolic and patoprazole   Do not wear jewelry, make-up or nail polish.  Do not wear lotions, powders, or perfumes. You may wear deodorant.  Do not shave 48 hours prior to surgery. Men may shave face and neck.  Do not bring valuables to the hospital.  Contacts, dentures or bridgework may not be worn into surgery.  Leave suitcase in the car. After surgery it may be brought to your room.  For patients admitted to the hospital, checkout time is 11:00 AM the day of discharge.   Patients discharged the day of surgery will not be allowed to drive home.    Special Instructions: Shower using CHG night before surgery and shower the day of surgery use CHG.  Use special wash - you have one bottle of CHG for all showers.  You should use approximately 1/2 of the bottle for each shower.   Care After This sheet gives you information about how to care for yourself after your procedure. Your health care provider may also give you more specific instructions. If you have problems or questions, contact your health care provider. What can I expect after the procedure? After the procedure, it is common to have:  Breast swelling.  Breast tenderness.  Stiffness in your arm or shoulder.  A change in the shape and feel of your breast.  Scar tissue that feels hard to the touch in the area where the lump was removed. Follow these instructions at home: Medicines  Take over-the-counter and prescription medicines only as told by your health care provider.  Take your antibiotic medicine as told by your health care provider. Do not stop taking the antibiotic even if you start to feel  better.  If you are taking prescription pain medicine, take actions to prevent or treat constipation. Your health care provider may recommend that you: ? Drink enough fluid to keep your urine pale yellow. ? Eat foods that are high in fiber, such as fresh fruits and vegetables, whole grains, and beans. ? Limit foods that are high in fat and processed sugars, such as fried and sweet foods. ? Take an over-the-counter or prescription medicine for constipation. Bathing  Do not take baths, swim, or use a hot tub until your health care provider approves. Ask your health care provider if you may take showers. You may only be allowed to take sponge baths. Incision care      Follow instructions from your health care provider about how to take care of your incision. Make sure you: ? Wash your hands with soap and water before you change your bandage (dressing). If soap and water are not available, use hand sanitizer. ? Change your dressing as told by your health care provider. ? Leave stitches (sutures), skin glue, or adhesive strips in place. These skin closures may need to stay in place for 2 weeks or longer. If adhesive strip edges start to loosen and curl up, you may trim the loose edges. Do not remove adhesive strips completely unless your health care provider tells you to do that.  Check your incision area every day for signs of infection. Check for: ? Redness,  swelling, or pain. ? Fluid or blood. ? Warmth. ? Pus or a bad smell.  Keep your dressing clean and dry.  If you were sent home with a surgical drain in place, follow instructions from your health care provider about emptying it. Activity  Return to your normal activities as told by your health care provider. Ask your health care provider what activities are safe for you.  Be careful to avoid any activities that could cause an injury to your arm on the side of your surgery.  Do not lift anything that is heavier than 10 lb (4.5 kg),  or the limit that you are told, until your health care provider says that it is safe. Avoid lifting with the arm that is on the side of your surgery.  Do not carry heavy objects on your shoulder on the side of your surgery.  After your drain is removed, you should perform exercises to keep your arm from getting stiff and swollen. Talk with your health care provider about which exercises are safe for you. General instructions  Do not drive or use heavy machinery while taking prescription pain medicine.  Wear a supportive bra as told by your health care provider.  Raise (elevate) your arm above the level of your heart while you are sitting or lying down.  Do not wear tight jewelry on your arm, wrist, or fingers on the side of your surgery.  Keep all follow-up visits as told by your health care provider. This is important. ? You may need to be screened for extra fluid around the lymph nodes (lymphedema). Follow instructions from your health care provider about how often you should be checked.  If you had any lymph nodes removed during your procedure, be sure to tell all of your health care providers. This is important information to share before you are involved in certain procedures, such as having blood tests or having your blood pressure taken. Contact a health care provider if:  You develop a rash.  You have a fever.  Your pain medicine is not working.  Your swelling, weakness, or numbness in your arm has not improved after a few weeks.  You have new swelling in your breast or arm.  You have redness, swelling, or pain in your incision area.  You have fluid or blood coming from your incision.  Your incision feels warm to the touch.  You have pus or a bad smell coming from your incision. Get help right away if:  You have very bad pain in your breast or arm.  You have chest pain.  You have difficulty breathing. Summary  After the procedure, it is common to have breast  tenderness, swelling, and stiffness in your arm and shoulder.  Follow instructions from your health care provider about how to take care of your incision.  Do not lift anything that is heavier than 10 lb (4.5 kg), or the limit that you are told, until your health care provider says that it is safe. Avoid lifting with the arm that is on the side of your surgery.  If you had any lymph nodes removed during your procedure, be sure to tell all of your health care providers. This is important information to share before you are involved in certain procedures, such as having blood tests or having your blood pressure taken. This information is not intended to replace advice given to you by your health care provider. Make sure you discuss any questions you have with your  health care provider. Document Released: 08/16/2006 Document Revised: 11/27/2017 Document Reviewed: 04/12/2016 Elsevier Interactive Patient Education  2019 [place] Anesthesia, Adult, Care After This sheet gives you information about how to care for yourself after your procedure. Your health care provider may also give you more specific instructions. If you have problems or questions, contact your health care provider. What can I expect after the procedure? After the procedure, the following side effects are common:  Pain or discomfort at the IV site.  Nausea.  Vomiting.  Sore throat.  Trouble concentrating.  Feeling cold or chills.  Weak or tired.  Sleepiness and fatigue.  Soreness and body aches. These side effects can affect parts of the body that were not involved in surgery. Follow these instructions at home:  For at least 24 hours after the procedure:  Have a responsible adult stay with you. It is important to have someone help care for you until you are awake and alert.  Rest as needed.  Do not: ? Participate in activities in which you could fall or become injured. ? Drive. ? Use heavy  machinery. ? Drink alcohol. ? Take sleeping pills or medicines that cause drowsiness. ? Make important decisions or sign legal documents. ? Take care of children on your own. Eating and drinking  Follow any instructions from your health care provider about eating or drinking restrictions.  When you feel hungry, start by eating small amounts of foods that are soft and easy to digest (bland), such as toast. Gradually return to your regular diet.  Drink enough fluid to keep your urine pale yellow.  If you vomit, rehydrate by drinking water, juice, or clear broth. General instructions  If you have sleep apnea, surgery and certain medicines can increase your risk for breathing problems. Follow instructions from your health care provider about wearing your sleep device: ? Anytime you are sleeping, including during daytime naps. ? While taking prescription pain medicines, sleeping medicines, or medicines that make you drowsy.  Return to your normal activities as told by your health care provider. Ask your health care provider what activities are safe for you.  Take over-the-counter and prescription medicines only as told by your health care provider.  If you smoke, do not smoke without supervision.  Keep all follow-up visits as told by your health care provider. This is important. Contact a health care provider if:  You have nausea or vomiting that does not get better with medicine.  You cannot eat or drink without vomiting.  You have pain that does not get better with medicine.  You are unable to pass urine.  You develop a skin rash.  You have a fever.  You have redness around your IV site that gets worse. Get help right away if:  You have difficulty breathing.  You have chest pain.  You have blood in your urine or stool, or you vomit blood. Summary  After the procedure, it is common to have a sore throat or nausea. It is also common to feel tired.  Have a responsible  adult stay with you for the first 24 hours after general anesthesia. It is important to have someone help care for you until you are awake and alert.  When you feel hungry, start by eating small amounts of foods that are soft and easy to digest (bland), such as toast. Gradually return to your regular diet.  Drink enough fluid to keep your urine pale yellow.  Return to your normal  activities as told by your health care provider. Ask your health care provider what activities are safe for you. This information is not intended to replace advice given to you by your health care provider. Make sure you discuss any questions you have with your health care provider. Document Released: 11/06/2000 Document Revised: 03/16/2017 Document Reviewed: 03/16/2017 Elsevier Interactive Patient Education  2019 [employer].

## 2018-09-12 ENCOUNTER — Encounter (HOSPITAL_COMMUNITY)
Admission: RE | Admit: 2018-09-12 | Discharge: 2018-09-12 | Disposition: A | Discharge status: Home / Self Care | Payer: Medicare Other | Source: Ambulatory Visit | Attending: General Surgery | Admitting: General Surgery

## 2018-09-12 DIAGNOSIS — D509 Iron deficiency anemia, unspecified: Secondary | ICD-10-CM | POA: Diagnosis not present

## 2018-09-12 DIAGNOSIS — N186 End stage renal disease: Secondary | ICD-10-CM | POA: Diagnosis not present

## 2018-09-12 DIAGNOSIS — E119 Type 2 diabetes mellitus without complications: Secondary | ICD-10-CM | POA: Diagnosis not present

## 2018-09-12 DIAGNOSIS — E1129 Type 2 diabetes mellitus with other diabetic kidney complication: Secondary | ICD-10-CM | POA: Diagnosis not present

## 2018-09-12 DIAGNOSIS — N2581 Secondary hyperparathyroidism of renal origin: Secondary | ICD-10-CM | POA: Diagnosis not present

## 2018-09-13 ENCOUNTER — Other Ambulatory Visit: Payer: Self-pay | Admitting: Neurology

## 2018-09-13 ENCOUNTER — Other Ambulatory Visit: Payer: Self-pay | Admitting: Family Medicine

## 2018-09-14 DIAGNOSIS — Z992 Dependence on renal dialysis: Secondary | ICD-10-CM | POA: Diagnosis not present

## 2018-09-14 DIAGNOSIS — N040 Nephrotic syndrome with minor glomerular abnormality: Secondary | ICD-10-CM | POA: Diagnosis not present

## 2018-09-14 DIAGNOSIS — N2581 Secondary hyperparathyroidism of renal origin: Secondary | ICD-10-CM | POA: Diagnosis not present

## 2018-09-14 DIAGNOSIS — E1129 Type 2 diabetes mellitus with other diabetic kidney complication: Secondary | ICD-10-CM | POA: Diagnosis not present

## 2018-09-14 DIAGNOSIS — E119 Type 2 diabetes mellitus without complications: Secondary | ICD-10-CM | POA: Diagnosis not present

## 2018-09-14 DIAGNOSIS — D509 Iron deficiency anemia, unspecified: Secondary | ICD-10-CM | POA: Diagnosis not present

## 2018-09-14 DIAGNOSIS — N186 End stage renal disease: Secondary | ICD-10-CM | POA: Diagnosis not present

## 2018-09-14 DIAGNOSIS — D631 Anemia in chronic kidney disease: Secondary | ICD-10-CM | POA: Diagnosis not present

## 2018-09-17 ENCOUNTER — Telehealth: Payer: Self-pay | Admitting: Family Medicine

## 2018-09-17 ENCOUNTER — Other Ambulatory Visit: Payer: Self-pay | Admitting: Family Medicine

## 2018-09-17 DIAGNOSIS — E1129 Type 2 diabetes mellitus with other diabetic kidney complication: Secondary | ICD-10-CM | POA: Diagnosis not present

## 2018-09-17 DIAGNOSIS — D631 Anemia in chronic kidney disease: Secondary | ICD-10-CM | POA: Diagnosis not present

## 2018-09-17 DIAGNOSIS — E119 Type 2 diabetes mellitus without complications: Secondary | ICD-10-CM | POA: Diagnosis not present

## 2018-09-17 DIAGNOSIS — D509 Iron deficiency anemia, unspecified: Secondary | ICD-10-CM | POA: Diagnosis not present

## 2018-09-17 DIAGNOSIS — N2581 Secondary hyperparathyroidism of renal origin: Secondary | ICD-10-CM | POA: Diagnosis not present

## 2018-09-17 DIAGNOSIS — N186 End stage renal disease: Secondary | ICD-10-CM | POA: Diagnosis not present

## 2018-09-17 MED ORDER — HYDROCODONE-ACETAMINOPHEN 5-325 MG PO TABS: 1.0000 | ORAL_TABLET | Freq: Four times a day (QID) | ORAL | 0 refills | Status: DC | PRN

## 2018-09-17 NOTE — Telephone Encounter (Signed)
Pt aware and med sent to pharm 

## 2018-09-17 NOTE — Telephone Encounter (Signed)
Patient called [facility] stating that she had discussed pain in her feet with you last time and now it is getting worse to the point that she is having to put ice packs on them at night. She would like to know if something could be called in for this?    CB# [phone]

## 2018-09-17 NOTE — Telephone Encounter (Signed)
Pt did get this and it does not help at all. Is there something else she can try? She is having surgery tomorrow.

## 2018-09-17 NOTE — Telephone Encounter (Signed)
I prescribed desipramine 25 mg a day.

## 2018-09-17 NOTE — Telephone Encounter (Signed)
Next step would be to stop desipiramine and replace with norco 5/325 poq 6 hrs prn pain (30)

## 2018-09-18 ENCOUNTER — Ambulatory Visit (HOSPITAL_COMMUNITY): Payer: Medicare Other | Admitting: Anesthesiology

## 2018-09-18 ENCOUNTER — Ambulatory Visit (HOSPITAL_COMMUNITY)
Admission: RE | Admit: 2018-09-18 | Discharge: 2018-09-18 | Disposition: A | Discharge status: Home / Self Care | Payer: Medicare Other | Source: Ambulatory Visit | Attending: General Surgery | Admitting: General Surgery

## 2018-09-18 ENCOUNTER — Ambulatory Visit: Payer: Medicare Other

## 2018-09-18 ENCOUNTER — Encounter (HOSPITAL_COMMUNITY): Payer: Self-pay | Admitting: *Deleted

## 2018-09-18 ENCOUNTER — Ambulatory Visit (HOSPITAL_COMMUNITY)
Admission: RE | Admit: 2018-09-18 | Discharge: 2018-09-18 | Disposition: A | Discharge status: Home / Self Care | Payer: Medicare Other | Attending: General Surgery | Admitting: General Surgery

## 2018-09-18 ENCOUNTER — Ambulatory Visit: Payer: Medicare Other | Admitting: Gastroenterology

## 2018-09-18 ENCOUNTER — Encounter (HOSPITAL_COMMUNITY)
Admission: RE | Disposition: A | Discharge status: Home / Self Care | Payer: Self-pay | Source: Home / Self Care | Attending: General Surgery

## 2018-09-18 ENCOUNTER — Ambulatory Visit (HOSPITAL_COMMUNITY): Payer: Medicare Other

## 2018-09-18 DIAGNOSIS — E119 Type 2 diabetes mellitus without complications: Secondary | ICD-10-CM | POA: Diagnosis not present

## 2018-09-18 DIAGNOSIS — I10 Essential (primary) hypertension: Secondary | ICD-10-CM | POA: Diagnosis not present

## 2018-09-18 DIAGNOSIS — C50211 Malignant neoplasm of upper-inner quadrant of right female breast: Secondary | ICD-10-CM

## 2018-09-18 DIAGNOSIS — Z7982 Long term (current) use of aspirin: Secondary | ICD-10-CM | POA: Diagnosis not present

## 2018-09-18 DIAGNOSIS — E1122 Type 2 diabetes mellitus with diabetic chronic kidney disease: Secondary | ICD-10-CM | POA: Insufficient documentation

## 2018-09-18 DIAGNOSIS — Z992 Dependence on renal dialysis: Secondary | ICD-10-CM | POA: Insufficient documentation

## 2018-09-18 DIAGNOSIS — E782 Mixed hyperlipidemia: Secondary | ICD-10-CM | POA: Insufficient documentation

## 2018-09-18 DIAGNOSIS — Z7682 Awaiting organ transplant status: Secondary | ICD-10-CM | POA: Diagnosis not present

## 2018-09-18 DIAGNOSIS — I120 Hypertensive chronic kidney disease with stage 5 chronic kidney disease or end stage renal disease: Secondary | ICD-10-CM | POA: Diagnosis not present

## 2018-09-18 DIAGNOSIS — Z79899 Other long term (current) drug therapy: Secondary | ICD-10-CM | POA: Diagnosis not present

## 2018-09-18 DIAGNOSIS — N186 End stage renal disease: Secondary | ICD-10-CM | POA: Diagnosis not present

## 2018-09-18 DIAGNOSIS — K219 Gastro-esophageal reflux disease without esophagitis: Secondary | ICD-10-CM | POA: Diagnosis not present

## 2018-09-18 DIAGNOSIS — R928 Other abnormal and inconclusive findings on diagnostic imaging of breast: Secondary | ICD-10-CM

## 2018-09-18 DIAGNOSIS — Z170 Estrogen receptor positive status [ER+]: Secondary | ICD-10-CM | POA: Diagnosis not present

## 2018-09-18 DIAGNOSIS — C50911 Malignant neoplasm of unspecified site of right female breast: Secondary | ICD-10-CM | POA: Diagnosis not present

## 2018-09-18 HISTORY — PX: PARTIAL MASTECTOMY WITH NEEDLE LOCALIZATION AND AXILLARY SENTINEL LYMPH NODE BX: SHX6009

## 2018-09-18 LAB — GLUCOSE, CAPILLARY: Glucose-Capillary: 89 mg/dL (ref 70–99)

## 2018-09-18 SURGERY — PARTIAL MASTECTOMY WITH NEEDLE LOCALIZATION AND AXILLARY SENTINEL LYMPH NODE BX
Anesthesia: General | Laterality: Right

## 2018-09-18 MED ORDER — LIDOCAINE 2% (20 MG/ML) 5 ML SYRINGE
INTRAMUSCULAR | Status: AC
  Filled 2018-09-18: qty 5

## 2018-09-18 MED ORDER — HYDROMORPHONE HCL 1 MG/ML IJ SOLN: 0.2500 mg | INTRAMUSCULAR | Status: DC | PRN

## 2018-09-18 MED ORDER — BUPIVACAINE HCL (PF) 0.5 % IJ SOLN
INTRAMUSCULAR | Status: DC | PRN
  Administered 2018-09-18: 7 mL

## 2018-09-18 MED ORDER — SODIUM CHLORIDE (PF) 0.9 % IJ SOLN
INTRAMUSCULAR | Status: AC
  Filled 2018-09-18: qty 10

## 2018-09-18 MED ORDER — GLYCOPYRROLATE PF 0.2 MG/ML IJ SOSY
PREFILLED_SYRINGE | INTRAMUSCULAR | Status: AC
  Filled 2018-09-18: qty 1

## 2018-09-18 MED ORDER — PROPOFOL 10 MG/ML IV BOLUS
INTRAVENOUS | Status: AC
  Filled 2018-09-18: qty 20

## 2018-09-18 MED ORDER — MEPERIDINE HCL 50 MG/ML IJ SOLN: 6.2500 mg | INTRAMUSCULAR | Status: DC | PRN

## 2018-09-18 MED ORDER — PROPOFOL 10 MG/ML IV BOLUS
INTRAVENOUS | Status: DC | PRN
  Administered 2018-09-18: 40 mg via INTRAVENOUS
  Administered 2018-09-18: 30 mg via INTRAVENOUS
  Administered 2018-09-18: 130 mg via INTRAVENOUS

## 2018-09-18 MED ORDER — BUPIVACAINE HCL (PF) 0.5 % IJ SOLN
INTRAMUSCULAR | Status: AC
  Filled 2018-09-18: qty 30

## 2018-09-18 MED ORDER — CHLORHEXIDINE GLUCONATE CLOTH 2 % EX PADS: 6.0000 | MEDICATED_PAD | Freq: Once | CUTANEOUS | Status: DC

## 2018-09-18 MED ORDER — SUCCINYLCHOLINE CHLORIDE 20 MG/ML IJ SOLN
INTRAMUSCULAR | Status: DC | PRN
  Administered 2018-09-18: 100 mg via INTRAVENOUS

## 2018-09-18 MED ORDER — ONDANSETRON HCL 4 MG/2ML IJ SOLN
INTRAMUSCULAR | Status: DC | PRN
  Administered 2018-09-18: 4 mg via INTRAVENOUS

## 2018-09-18 MED ORDER — PHENYLEPHRINE HCL 10 MG/ML IJ SOLN
INTRAMUSCULAR | Status: DC | PRN
  Administered 2018-09-18 (×4): 80 ug via INTRAVENOUS

## 2018-09-18 MED ORDER — ONDANSETRON HCL 4 MG PO TABS: 4.0000 mg | ORAL_TABLET | Freq: Three times a day (TID) | ORAL | 0 refills | Status: DC | PRN

## 2018-09-18 MED ORDER — SODIUM CHLORIDE 0.9 % IV SOLN
Freq: Once | INTRAVENOUS | Status: AC
  Administered 2018-09-18: 500 mL via INTRAVENOUS

## 2018-09-18 MED ORDER — METHYLENE BLUE 0.5 % INJ SOLN
INTRAVENOUS | Status: AC
  Filled 2018-09-18: qty 10

## 2018-09-18 MED ORDER — HYDROCODONE-ACETAMINOPHEN 7.5-325 MG PO TABS: 1.0000 | ORAL_TABLET | Freq: Once | ORAL | Status: DC | PRN

## 2018-09-18 MED ORDER — FENTANYL CITRATE (PF) 250 MCG/5ML IJ SOLN
INTRAMUSCULAR | Status: AC
  Filled 2018-09-18: qty 5

## 2018-09-18 MED ORDER — FENTANYL CITRATE (PF) 100 MCG/2ML IJ SOLN
INTRAMUSCULAR | Status: DC | PRN
  Administered 2018-09-18 (×2): 50 ug via INTRAVENOUS

## 2018-09-18 MED ORDER — GLYCOPYRROLATE PF 0.2 MG/ML IJ SOSY
PREFILLED_SYRINGE | INTRAMUSCULAR | Status: DC | PRN
  Administered 2018-09-18: .2 mg via INTRAVENOUS

## 2018-09-18 MED ORDER — PENTAFLUOROPROP-TETRAFLUOROETH EX AERO
INHALATION_SPRAY | CUTANEOUS | Status: AC
  Filled 2018-09-18: qty 116

## 2018-09-18 MED ORDER — HEMOSTATIC AGENTS (NO CHARGE) OPTIME
TOPICAL | Status: DC | PRN
  Administered 2018-09-18: 1 via TOPICAL

## 2018-09-18 MED ORDER — TECHNETIUM TC 99M SULFUR COLLOID FILTERED
0.5000 | Freq: Once | INTRAVENOUS | Status: AC | PRN
  Administered 2018-09-18: 0.49 via INTRADERMAL

## 2018-09-18 MED ORDER — CEFAZOLIN SODIUM-DEXTROSE 2-4 GM/100ML-% IV SOLN
2.0000 g | INTRAVENOUS | Status: AC
  Administered 2018-09-18: 2 g via INTRAVENOUS
  Filled 2018-09-18: qty 100

## 2018-09-18 MED ORDER — ONDANSETRON HCL 4 MG/2ML IJ SOLN: 4.0000 mg | Freq: Once | INTRAMUSCULAR | Status: DC | PRN

## 2018-09-18 MED ORDER — SODIUM CHLORIDE 0.9 % IV SOLN
INTRAVENOUS | Status: DC | PRN
  Administered 2018-09-18: 11:00:00 via INTRAVENOUS

## 2018-09-18 MED ORDER — 0.9 % SODIUM CHLORIDE (POUR BTL) OPTIME
TOPICAL | Status: DC | PRN
  Administered 2018-09-18: 1000 mL

## 2018-09-18 MED ORDER — TECHNETIUM TC 99M SULFUR COLLOID FILTERED
0.5000 | Freq: Once | INTRAVENOUS | Status: AC | PRN
  Administered 2018-09-18: 0.5 via INTRADERMAL

## 2018-09-18 MED ORDER — LIDOCAINE HCL (PF) 2 % IJ SOLN
INTRAMUSCULAR | Status: AC
  Filled 2018-09-18: qty 10

## 2018-09-18 MED ORDER — SODIUM CHLORIDE (PF) 0.9 % IJ SOLN
INTRAVENOUS | Status: DC | PRN
  Administered 2018-09-18: 5 mL via INTRAMUSCULAR

## 2018-09-18 MED ORDER — KETOROLAC TROMETHAMINE 30 MG/ML IJ SOLN: 30.0000 mg | Freq: Once | INTRAMUSCULAR | Status: DC | PRN

## 2018-09-18 MED ORDER — HYDROCODONE-ACETAMINOPHEN 5-325 MG PO TABS: 1.0000 | ORAL_TABLET | ORAL | 0 refills | Status: DC | PRN

## 2018-09-18 MED ORDER — LIDOCAINE 2% (20 MG/ML) 5 ML SYRINGE
INTRAMUSCULAR | Status: DC | PRN
  Administered 2018-09-18: 40 mg via INTRAVENOUS

## 2018-09-18 SURGICAL SUPPLY — 39 items
ADH SKN CLS APL DERMABOND .7 (GAUZE/BANDAGES/DRESSINGS) ×2
APPLIER CLIP 11 MED OPEN (CLIP) ×2
APPLIER CLIP 9.375 SM OPEN (CLIP) ×2
APR CLP MED 11 20 MLT OPN (CLIP) ×1
APR CLP SM 9.3 20 MLT OPN (CLIP) ×1
CHLORAPREP W/TINT 26ML (MISCELLANEOUS) ×2 IMPLANT
CLIP APPLIE 11 MED OPEN (CLIP) IMPLANT
CLIP APPLIE 9.375 SM OPEN (CLIP) ×1 IMPLANT
CLOTH BEACON ORANGE TIMEOUT ST (SAFETY) ×2 IMPLANT
COVER LIGHT HANDLE STERIS (MISCELLANEOUS) ×4 IMPLANT
COVER PROBE W GEL 5X96 (DRAPES) ×2 IMPLANT
COVER WAND RF STERILE (DRAPES) ×1 IMPLANT
DECANTER SPIKE VIAL GLASS SM (MISCELLANEOUS) ×2 IMPLANT
DERMABOND ADVANCED (GAUZE/BANDAGES/DRESSINGS) ×2
DERMABOND ADVANCED .7 DNX12 (GAUZE/BANDAGES/DRESSINGS) ×1 IMPLANT
DRAPE HALF SHEET 40X57 (DRAPES) ×1 IMPLANT
ELECT REM PT RETURN 9FT ADLT (ELECTROSURGICAL) ×2
ELECTRODE REM PT RTRN 9FT ADLT (ELECTROSURGICAL) ×1 IMPLANT
GLOVE BIO SURGEON STRL SZ7 (GLOVE) ×1 IMPLANT
GLOVE BIOGEL PI IND STRL 7.0 (GLOVE) ×1 IMPLANT
GLOVE BIOGEL PI INDICATOR 7.0 (GLOVE) ×2
GLOVE SURG SS PI 7.5 STRL IVOR (GLOVE) ×4 IMPLANT
GOWN STRL REUS W/TWL LRG LVL3 (GOWN DISPOSABLE) ×6 IMPLANT
HEMOSTAT ARISTA ABSORB 1G (HEMOSTASIS) ×1 IMPLANT
KIT TURNOVER KIT A (KITS) ×2 IMPLANT
MANIFOLD NEPTUNE II (INSTRUMENTS) ×2 IMPLANT
NDL HYPO 25X1 1.5 SAFETY (NEEDLE) ×2 IMPLANT
NEEDLE HYPO 25X1 1.5 SAFETY (NEEDLE) ×4 IMPLANT
NS IRRIG 1000ML POUR BTL (IV SOLUTION) ×2 IMPLANT
PACK MINOR (CUSTOM PROCEDURE TRAY) ×2 IMPLANT
PAD ARMBOARD 7.5X6 YLW CONV (MISCELLANEOUS) ×2 IMPLANT
SET BASIN LINEN APH (SET/KITS/TRAYS/PACK) ×2 IMPLANT
SPONGE LAP 18X18 RF (DISPOSABLE) ×2 IMPLANT
SUT MNCRL AB 4-0 PS2 18 (SUTURE) ×2 IMPLANT
SUT SILK 2 0 SH (SUTURE) ×1 IMPLANT
SUT VIC AB 3-0 SH 27 (SUTURE) ×2
SUT VIC AB 3-0 SH 27X BRD (SUTURE) ×1 IMPLANT
SYR BULB IRRIGATION 50ML (SYRINGE) ×2 IMPLANT
SYR CONTROL 10ML LL (SYRINGE) ×4 IMPLANT

## 2018-09-18 NOTE — Progress Notes (Signed)
Nuclear medicine in to perform the sentinel node injection into the right breast. Tolerated well, husband at bedside.

## 2018-09-18 NOTE — Progress Notes (Signed)
Back to pre-op from mammogram after needle localization, tolerated well.

## 2018-09-18 NOTE — Interval H&P Note (Signed)
History and Physical Interval Note:  09/18/2018 11:29 AM  Megan [name]  has presented today for surgery, with the diagnosis of right breast cancer  The various methods of treatment have been discussed with the patient and family. After consideration of risks, benefits and other options for treatment, the patient has consented to  Procedure(s): PARTIAL MASTECTOMY WITH NEEDLE LOCALIZATION AND AXILLARY SENTINEL LYMPH NODE BX (Right) as a surgical intervention .  The patient's history has been reviewed, patient examined, no change in status, stable for surgery.  I have reviewed the patient's chart and labs.  Questions were answered to the patient's satisfaction.     Aviva Signs

## 2018-09-18 NOTE — Progress Notes (Signed)
To Mammography for needle localization of the right breast via wheelchair.

## 2018-09-18 NOTE — Op Note (Signed)
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  The patient now presents for a right partial mastectomy after needle localization, sentinel lymph node biopsy, possible axillary dissection.  The risks and benefits of the procedures including bleeding, infection, nerve injury, and the possibility of incomplete margins were fully explained to the patient, who gave informed consent.  Procedure note: The patient was placed in supine position.  The patient had already undergone needle localization in the radiology department and radioactive nuclide injection in the preop area.  After induction of general endotracheal anesthesia, half-strength methylene blue was instilled into the breast subdermally at the area of the needle localization.  This was massaged into the right breast for 5 minutes.  The right breast and axilla were then prepped and draped using usual sterile technique with DuraPrep.  Surgical site confirmation was performed.  An incision was made in the right axilla after a neoprobe was used to to identify possible sentinel nodes.  The dissection was taken down to the right axillary contents.  3 blue lymph nodes were identified using the neoprobe and were removed.  Any bleeding was controlled using small clips.  All 3 lymph nodes removed were sent to pathology for frozen section.  They were negative for metastatic disease.  Basin counts using the neoprobe were less than 10% of the in vivo counts.  0.5% Sensorcaine was  instilled into the surrounding wound.  The skin was closed using a 4-0 Monocryl subcuticular suture.  Next, a right partial mastectomy was performed.  A curvilinear incision was made to include the needles.  The dissection was taken down to the chest wall as the now palpable mass was noted deep in the right breast.  A portion of the fascia of the pectoralis major muscle was excised along with a wide margin of normal breast tissue.  A short suture was placed superiorly and a long suture placed laterally for orientation purposes.  The specimen was sent to the Radiology department for specimen radiography.  The suspicious area was noted to be within the specimen removed.  It was then sent to pathology for further examination.  Any bleeding was controlled using Bovie electrocautery.  The wound was irrigated with normal saline.  The subcutaneous layer was reapproximated using a 3-0 Vicryl interrupted suture.  The skin was closed using a 4-0 Monocryl subcuticular suture.  0.5% Sensorcaine was instilled into the surrounding wound.  Both incisions were closed with Dermabond.  All tape and needle counts were correct at the end of the procedure.  The patient was extubated in the operating room and transferred to PACU in stable condition.  Complications: None  EBL: Minimal  Specimen: Right axillary sentinel lymph nodes x3, right breast tissue

## 2018-09-18 NOTE — Procedures (Signed)
PreOperative Dx: RT beast cancer Postoperative Dx: RT breast cancer Procedure:   Preoperative needle localization of the RT breast under mammographic guidance Radiologist:  Thornton Papas Anesthesia:  2 ml of1% lidocaine Specimen:  None EBL:   < 1 ml Complications: None

## 2018-09-18 NOTE — Discharge Instructions (Signed)
Lumpectomy, Care After This sheet gives you information about how to care for yourself after your procedure. Your health care provider may also give you more specific instructions. If you have problems or questions, contact your health care provider. What can I expect after the procedure? After the procedure, it is common to have:  Breast swelling.  Breast tenderness.  Stiffness in your arm or shoulder.  A change in the shape and feel of your breast.  Scar tissue that feels hard to the touch in the area where the lump was removed. Follow these instructions at home: Medicines  Take over-the-counter and prescription medicines only as told by your health care provider.  Take your antibiotic medicine as told by your health care provider. Do not stop taking the antibiotic even if you start to feel better.  If you are taking prescription pain medicine, take actions to prevent or treat constipation. Your health care provider may recommend that you: ? Drink enough fluid to keep your urine pale yellow. ? Eat foods that are high in fiber, such as fresh fruits and vegetables, whole grains, and beans. ? Limit foods that are high in fat and processed sugars, such as fried and sweet foods. ? Take an over-the-counter or prescription medicine for constipation. Bathing  Do not take baths, swim, or use a hot tub until your health care provider approves. Ask your health care provider if you may take showers. You may only be allowed to take sponge baths. Incision care      Follow instructions from your health care provider about how to take care of your incision. Make sure you: ? Wash your hands with soap and water before you change your bandage (dressing). If soap and water are not available, use hand sanitizer. ? Change your dressing as told by your health care provider. ? Leave stitches (sutures), skin glue, or adhesive strips in place. These skin closures may need to stay in place for 2 weeks or  longer. If adhesive strip edges start to loosen and curl up, you may trim the loose edges. Do not remove adhesive strips completely unless your health care provider tells you to do that.  Check your incision area every day for signs of infection. Check for: ? Redness, swelling, or pain. ? Fluid or blood. ? Warmth. ? Pus or a bad smell.  Keep your dressing clean and dry.  If you were sent home with a surgical drain in place, follow instructions from your health care provider about emptying it. Activity  Return to your normal activities as told by your health care provider. Ask your health care provider what activities are safe for you.  Be careful to avoid any activities that could cause an injury to your arm on the side of your surgery.  Do not lift anything that is heavier than 10 lb (4.5 kg), or the limit that you are told, until your health care provider says that it is safe. Avoid lifting with the arm that is on the side of your surgery.  Do not carry heavy objects on your shoulder on the side of your surgery.  After your drain is removed, you should perform exercises to keep your arm from getting stiff and swollen. Talk with your health care provider about which exercises are safe for you. General instructions  Do not drive or use heavy machinery while taking prescription pain medicine.  Wear a supportive bra as told by your health care provider.  Raise (elevate) your arm above  the level of your heart while you are sitting or lying down.  Do not wear tight jewelry on your arm, wrist, or fingers on the side of your surgery.  Keep all follow-up visits as told by your health care provider. This is important. ? You may need to be screened for extra fluid around the lymph nodes (lymphedema). Follow instructions from your health care provider about how often you should be checked.  If you had any lymph nodes removed during your procedure, be sure to tell all of your health care  providers. This is important information to share before you are involved in certain procedures, such as having blood tests or having your blood pressure taken. Contact a health care provider if:  You develop a rash.  You have a fever.  Your pain medicine is not working.  Your swelling, weakness, or numbness in your arm has not improved after a few weeks.  You have new swelling in your breast or arm.  You have redness, swelling, or pain in your incision area.  You have fluid or blood coming from your incision.  Your incision feels warm to the touch.  You have pus or a bad smell coming from your incision. Get help right away if:  You have very bad pain in your breast or arm.  You have chest pain.  You have difficulty breathing. Summary  After the procedure, it is common to have breast tenderness, swelling, and stiffness in your arm and shoulder.  Follow instructions from your health care provider about how to take care of your incision.  Do not lift anything that is heavier than 10 lb (4.5 kg), or the limit that you are told, until your health care provider says that it is safe. Avoid lifting with the arm that is on the side of your surgery.  If you had any lymph nodes removed during your procedure, be sure to tell all of your health care providers. This is important information to share before you are involved in certain procedures, such as having blood tests or having your blood pressure taken. This information is not intended to replace advice given to you by your health care provider. Make sure you discuss any questions you have with your health care provider. Document Released: 08/16/2006 Document Revised: 11/27/2017 Document Reviewed: 04/12/2016 Elsevier Interactive Patient Education  2019 [employer] [place] Anesthesia, Adult, Care After This sheet gives you information about how to care for yourself after your procedure. Your health care provider may also  give you more specific instructions. If you have problems or questions, contact your health care provider. What can I expect after the procedure? After the procedure, the following side effects are common:  Pain or discomfort at the IV site.  Nausea.  Vomiting.  Sore throat.  Trouble concentrating.  Feeling cold or chills.  Weak or tired.  Sleepiness and fatigue.  Soreness and body aches. These side effects can affect parts of the body that were not involved in surgery. Follow these instructions at home:  For at least 24 hours after the procedure:  Have a responsible adult stay with you. It is important to have someone help care for you until you are awake and alert.  Rest as needed.  Do not: ? Participate in activities in which you could fall or become injured. ? Drive. ? Use heavy machinery. ? Drink alcohol. ? Take sleeping pills or medicines that cause drowsiness. ? Make important decisions or sign  legal documents. ? Take care of children on your own. Eating and drinking  Follow any instructions from your health care provider about eating or drinking restrictions.  When you feel hungry, start by eating small amounts of foods that are soft and easy to digest (bland), such as toast. Gradually return to your regular diet.  Drink enough fluid to keep your urine pale yellow.  If you vomit, rehydrate by drinking water, juice, or clear broth. General instructions  If you have sleep apnea, surgery and certain medicines can increase your risk for breathing problems. Follow instructions from your health care provider about wearing your sleep device: ? Anytime you are sleeping, including during daytime naps. ? While taking prescription pain medicines, sleeping medicines, or medicines that make you drowsy.  Return to your normal activities as told by your health care provider. Ask your health care provider what activities are safe for you.  Take over-the-counter and  prescription medicines only as told by your health care provider.  If you smoke, do not smoke without supervision.  Keep all follow-up visits as told by your health care provider. This is important. Contact a health care provider if:  You have nausea or vomiting that does not get better with medicine.  You cannot eat or drink without vomiting.  You have pain that does not get better with medicine.  You are unable to pass urine.  You develop a skin rash.  You have a fever.  You have redness around your IV site that gets worse. Get help right away if:  You have difficulty breathing.  You have chest pain.  You have blood in your urine or stool, or you vomit blood. Summary  After the procedure, it is common to have a sore throat or nausea. It is also common to feel tired.  Have a responsible adult stay with you for the first 24 hours after general anesthesia. It is important to have someone help care for you until you are awake and alert.  When you feel hungry, start by eating small amounts of foods that are soft and easy to digest (bland), such as toast. Gradually return to your regular diet.  Drink enough fluid to keep your urine pale yellow.  Return to your normal activities as told by your health care provider. Ask your health care provider what activities are safe for you. This information is not intended to replace advice given to you by your health care provider. Make sure you discuss any questions you have with your health care provider. Document Released: 11/06/2000 Document Revised: 03/16/2017 Document Reviewed: 03/16/2017 Elsevier Interactive Patient Education  2019 [employer].

## 2018-09-18 NOTE — Anesthesia Postprocedure Evaluation (Signed)
Anesthesia Post Note  Patient: Megan [name]  Procedure(s) Performed: RIGHT PARTIAL MASTECTOMY AFTER NEEDLE LOCALIZATION, SENTINEL LYMPH NODE BIOPSY RIGHT AXILLA (Right )  Patient location during evaluation: PACU Anesthesia Type: General Level of consciousness: awake and patient cooperative Pain management: satisfactory to patient Vital Signs Assessment: post-procedure vital signs reviewed and stable Respiratory status: spontaneous breathing Cardiovascular status: stable Postop Assessment: no apparent nausea or vomiting Anesthetic complications: no     Last Vitals:  Vitals:   09/18/18 1330 09/18/18 1345  BP: (!) 169/86 (!) 179/91  Pulse:    Resp: (!) 21 18  Temp: 36.7 C   SpO2: 95% 96%    Last Pain:  Vitals:   09/18/18 1345  TempSrc:   PainSc: Asleep                 Wash Nienhaus

## 2018-09-18 NOTE — Anesthesia Procedure Notes (Signed)
Procedure Name: Intubation Date/Time: 09/18/2018 11:53 AM Performed by: Andree Elk, Amy A, CRNA Pre-anesthesia Checklist: Patient identified, Patient being monitored, Timeout performed, Emergency Drugs available and Suction available Patient Re-evaluated:Patient Re-evaluated prior to induction Oxygen Delivery Method: Circle system utilized Preoxygenation: Pre-oxygenation with 100% oxygen Induction Type: IV induction Ventilation: Mask ventilation without difficulty Laryngoscope Size: Mac and 3 Grade View: Grade I Tube type: Oral Tube size: 7.0 mm Number of attempts: 1 Airway Equipment and Method: Stylet Placement Confirmation: ETT inserted through vocal cords under direct vision,  positive ETCO2 and breath sounds checked- equal and bilateral Secured at: 21 cm Tube secured with: Tape Dental Injury: Teeth and Oropharynx as per pre-operative assessment

## 2018-09-18 NOTE — Transfer of Care (Signed)
Immediate Anesthesia Transfer of Care Note  Patient: Megan [name]  Procedure(s) Performed: RIGHT PARTIAL MASTECTOMY AFTER NEEDLE LOCALIZATION, SENTINEL LYMPH NODE BIOPSY RIGHT AXILLA (Right )  Patient Location: PACU  Anesthesia Type:General  Level of Consciousness: awake, alert , oriented and patient cooperative  Airway & Oxygen Therapy: Patient Spontanous Breathing  Post-op Assessment: Report given to RN and Post -op Vital signs reviewed and stable  Post vital signs: Reviewed and stable  Last Vitals:  Vitals Value Taken Time  BP 187/82 09/18/2018  1:23 PM  Temp    Pulse 102 09/18/2018  1:24 PM  Resp 20 09/18/2018  1:27 PM  SpO2 95 % 09/18/2018  1:24 PM  Vitals shown include unvalidated device data.  Last Pain:  Vitals:   09/18/18 0927  TempSrc: Oral  PainSc: 0-No pain      Patients Stated Pain Goal: 7 (04/16/00 4996)  Complications: No apparent anesthesia complications

## 2018-09-18 NOTE — Anesthesia Preprocedure Evaluation (Signed)
Anesthesia Evaluation  Patient identified by MRN, date of birth, ID band Patient awake    Reviewed: Allergy & Precautions, H&P , NPO status , Patient's Chart, lab work & pertinent test results, reviewed documented beta blocker date and time   History of Anesthesia Complications (+) PONV and history of anesthetic complications  Airway Mallampati: II  TM Distance: >3 FB Neck ROM: full    Dental no notable dental hx. (+) Missing   Pulmonary neg pulmonary ROS,    Pulmonary exam normal breath sounds clear to auscultation       Cardiovascular Exercise Tolerance: Good hypertension, negative cardio ROS   Rhythm:regular Rate:Normal     Neuro/Psych  Headaches, negative psych ROS   GI/Hepatic Neg liver ROS, GERD  ,  Endo/Other  negative endocrine ROSdiabetes  Renal/GU ESRF and DialysisRenal disease  negative genitourinary   Musculoskeletal   Abdominal   Peds  Hematology  (+) Blood dyscrasia, anemia ,   Anesthesia Other Findings   Reproductive/Obstetrics negative OB ROS                             Anesthesia Physical Anesthesia Plan  ASA: III  Anesthesia Plan: General   Post-op Pain Management:    Induction:   PONV Risk Score and Plan:   Airway Management Planned:   Additional Equipment:   Intra-op Plan:   Post-operative Plan:   Informed Consent: I have reviewed the patients History and Physical, chart, labs and discussed the procedure including the risks, benefits and alternatives for the proposed anesthesia with the patient or authorized representative who has indicated his/her understanding and acceptance.     Dental Advisory Given  Plan Discussed with: CRNA  Anesthesia Plan Comments:         Anesthesia Quick Evaluation

## 2018-09-19 ENCOUNTER — Encounter (HOSPITAL_COMMUNITY): Payer: Self-pay | Admitting: General Surgery

## 2018-09-19 DIAGNOSIS — N186 End stage renal disease: Secondary | ICD-10-CM | POA: Diagnosis not present

## 2018-09-19 DIAGNOSIS — E1129 Type 2 diabetes mellitus with other diabetic kidney complication: Secondary | ICD-10-CM | POA: Diagnosis not present

## 2018-09-19 DIAGNOSIS — N2581 Secondary hyperparathyroidism of renal origin: Secondary | ICD-10-CM | POA: Diagnosis not present

## 2018-09-19 DIAGNOSIS — D631 Anemia in chronic kidney disease: Secondary | ICD-10-CM | POA: Diagnosis not present

## 2018-09-19 DIAGNOSIS — E119 Type 2 diabetes mellitus without complications: Secondary | ICD-10-CM | POA: Diagnosis not present

## 2018-09-19 DIAGNOSIS — D509 Iron deficiency anemia, unspecified: Secondary | ICD-10-CM | POA: Diagnosis not present

## 2018-09-21 DIAGNOSIS — E119 Type 2 diabetes mellitus without complications: Secondary | ICD-10-CM | POA: Diagnosis not present

## 2018-09-21 DIAGNOSIS — E1129 Type 2 diabetes mellitus with other diabetic kidney complication: Secondary | ICD-10-CM | POA: Diagnosis not present

## 2018-09-21 DIAGNOSIS — N186 End stage renal disease: Secondary | ICD-10-CM | POA: Diagnosis not present

## 2018-09-21 DIAGNOSIS — D631 Anemia in chronic kidney disease: Secondary | ICD-10-CM | POA: Diagnosis not present

## 2018-09-21 DIAGNOSIS — D509 Iron deficiency anemia, unspecified: Secondary | ICD-10-CM | POA: Diagnosis not present

## 2018-09-21 DIAGNOSIS — N2581 Secondary hyperparathyroidism of renal origin: Secondary | ICD-10-CM | POA: Diagnosis not present

## 2018-09-23 ENCOUNTER — Ambulatory Visit: Payer: Medicare Other | Admitting: Neurology

## 2018-09-23 DIAGNOSIS — E1129 Type 2 diabetes mellitus with other diabetic kidney complication: Secondary | ICD-10-CM | POA: Diagnosis not present

## 2018-09-23 DIAGNOSIS — N2581 Secondary hyperparathyroidism of renal origin: Secondary | ICD-10-CM | POA: Diagnosis not present

## 2018-09-23 DIAGNOSIS — N186 End stage renal disease: Secondary | ICD-10-CM | POA: Diagnosis not present

## 2018-09-23 DIAGNOSIS — D509 Iron deficiency anemia, unspecified: Secondary | ICD-10-CM | POA: Diagnosis not present

## 2018-09-23 DIAGNOSIS — E119 Type 2 diabetes mellitus without complications: Secondary | ICD-10-CM | POA: Diagnosis not present

## 2018-09-23 DIAGNOSIS — D631 Anemia in chronic kidney disease: Secondary | ICD-10-CM | POA: Diagnosis not present

## 2018-09-24 ENCOUNTER — Ambulatory Visit (INDEPENDENT_AMBULATORY_CARE_PROVIDER_SITE_OTHER): Payer: Self-pay | Admitting: General Surgery

## 2018-09-24 ENCOUNTER — Encounter: Payer: Self-pay | Admitting: General Surgery

## 2018-09-24 VITALS — BP 170/108 | HR 89 | Temp 97.7°F | Resp 18 | Wt 174.2 lb

## 2018-09-24 DIAGNOSIS — Z09 Encounter for follow-up examination after completed treatment for conditions other than malignant neoplasm: Secondary | ICD-10-CM

## 2018-09-24 NOTE — Progress Notes (Signed)
Subjective:     Megan [name]  Here for surgery follow-up.  Patient has no complaints. Objective:    BP (!) 170/108 (BP Location: Right Arm, Patient Position: Sitting, Cuff Size: Large)   Pulse 89   Temp 97.7 F (36.5 C) (Temporal)   Resp 18   Wt 174 lb 3.2 oz (79 kg)   BMI 31.86 kg/m   General:  alert, cooperative and no distress  Right breast and axillary incisions healing well.  No swelling or hematoma present. Final pathology reveals a T1c, N0 invasive ductal carcinoma of the right breast.  Patient is aware of the diagnosis.     Assessment:    Doing well postoperatively.    Plan:   Will refer to oncology for further evaluation and treatment.  Follow-up here as needed.

## 2018-09-25 ENCOUNTER — Telehealth: Payer: Self-pay | Admitting: Family Medicine

## 2018-09-25 NOTE — Telephone Encounter (Signed)
Pt called and states that her BP is remaining high - 200/109,179/106 it was even high when she went to see Dr. Arnoldo Morale and would like to know what she should do about it?

## 2018-09-26 ENCOUNTER — Other Ambulatory Visit: Payer: Self-pay

## 2018-09-26 ENCOUNTER — Inpatient Hospital Stay (HOSPITAL_COMMUNITY): Payer: Medicare Other | Attending: Hematology | Admitting: Hematology

## 2018-09-26 ENCOUNTER — Encounter (HOSPITAL_COMMUNITY): Payer: Self-pay | Admitting: Hematology

## 2018-09-26 ENCOUNTER — Encounter (HOSPITAL_COMMUNITY): Payer: Self-pay | Admitting: *Deleted

## 2018-09-26 DIAGNOSIS — E119 Type 2 diabetes mellitus without complications: Secondary | ICD-10-CM | POA: Insufficient documentation

## 2018-09-26 DIAGNOSIS — E1129 Type 2 diabetes mellitus with other diabetic kidney complication: Secondary | ICD-10-CM | POA: Diagnosis not present

## 2018-09-26 DIAGNOSIS — I10 Essential (primary) hypertension: Secondary | ICD-10-CM | POA: Diagnosis not present

## 2018-09-26 DIAGNOSIS — D631 Anemia in chronic kidney disease: Secondary | ICD-10-CM | POA: Diagnosis not present

## 2018-09-26 DIAGNOSIS — N2581 Secondary hyperparathyroidism of renal origin: Secondary | ICD-10-CM | POA: Diagnosis not present

## 2018-09-26 DIAGNOSIS — Z170 Estrogen receptor positive status [ER+]: Secondary | ICD-10-CM | POA: Insufficient documentation

## 2018-09-26 DIAGNOSIS — G629 Polyneuropathy, unspecified: Secondary | ICD-10-CM | POA: Insufficient documentation

## 2018-09-26 DIAGNOSIS — Z992 Dependence on renal dialysis: Secondary | ICD-10-CM | POA: Diagnosis not present

## 2018-09-26 DIAGNOSIS — Z7982 Long term (current) use of aspirin: Secondary | ICD-10-CM | POA: Diagnosis not present

## 2018-09-26 DIAGNOSIS — C50211 Malignant neoplasm of upper-inner quadrant of right female breast: Secondary | ICD-10-CM | POA: Insufficient documentation

## 2018-09-26 DIAGNOSIS — D509 Iron deficiency anemia, unspecified: Secondary | ICD-10-CM | POA: Diagnosis not present

## 2018-09-26 DIAGNOSIS — Z79899 Other long term (current) drug therapy: Secondary | ICD-10-CM | POA: Insufficient documentation

## 2018-09-26 DIAGNOSIS — N186 End stage renal disease: Secondary | ICD-10-CM | POA: Insufficient documentation

## 2018-09-26 NOTE — Telephone Encounter (Signed)
Patient aware of providers recommendations and apt made

## 2018-09-26 NOTE — Progress Notes (Signed)
[facility] CONSULT NOTE  Patient Care Team: [name], MD as PCP - General (Family Medicine) [name] [name], MD (Gastroenterology) [name], MD as Consulting Physician (Cardiology)  CHIEF COMPLAINTS/PURPOSE OF CONSULTATION: Newly diagnosed breast cancer  HISTORY OF PRESENTING ILLNESS:  Megan [name] 58 y.o. female is here because of recent diagnosis of right breast cancer. On 02/26/18 she had a screening mammogram. They found a right breast mass that was suspious so it recommended a biopsy and a 6 month follow up mammogram. She had her biopsy on 03/05/18 and it came back negative. On 08/20/2018 she had a unilateral right breast mammogram to show the right breast mass. She was sent for another biopsy on 08/27/2018 where they found an invasive ductal carcinoma. She then underwent a right lumpectomy with sentinel node biopsy on 09/18/2018. She is here today with her husband and sister and doing well since her surgery. Her lumpectomy site is healing well. No drainage, redness or swelling noted. She has slight discomfort with her axillary incision. She reports she has been on dialysis for a year and a half and is now tolerating it well. She reports it causes fatigue after her treatments. She is on dialysis for renal failure due to uncontrolled hypertension for many years. She started having neuropathy in her feet 8 months ago. It is most painful at night when she is trying to sleep. She has dizzy episodes that effects her balance ambulation at times. Denies any nausea, vomiting, or diarrhea. Denies any new pains. Had not noticed any recent bleeding such as epistaxis, hematuria or hematochezia. Denies recent chest pain on exertion, shortness of breath on minimal exertion, pre-syncopal episodes, or palpitations. Denies any recent fevers, infections, or recent hospitalizations. Patient reports appetite at 50% and energy level at 25%. She reports her last colonoscopy in 2015  and she reports it was WNL.  She reports a negative family history for any type of cancer. She reports her father had a history of DVTs. She worked at [employer] most of her life and reports no exposure to any harmful chemicals.  She is full functioning adult and performs all her own ADLs and activities. She is able to bath and feed herself and cook and drive with no difficulties.   I reviewed her records extensively and collaborated the history with the patient.  SUMMARY OF ONCOLOGIC HISTORY:   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[place])   09/18/2018 Initial Diagnosis    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[place])    09/26/2018 Cancer Staging    Staging form: Breast, AJCC 8th Edition - Clinical stage from 09/26/2018: Stage IB (cT1c, cN0, cM0, G3, ER+, PR-, HER2-) - Signed by Derek Jack, MD on 09/26/2018     In terms of breast cancer risk profile:  She menarched at early age of 89 and she had a partial hysterectomy in 1995. She had 2 pregnancies, her first child was born at age 64. She had one miscarriage.  She did received birth control pills for approximately 1 year.  She was never exposed to fertility medications or hormone replacement therapy.  She doesn't have a family history of Breast/GYN/GI cancer.  MEDICAL HISTORY:  Past Medical History:  Diagnosis Date  . Anemia   . Blood transfusion without reported diagnosis   . Cataract   . CKD (chronic kidney disease) stage 3, GFR 30-59 ml/min (HCC)   . Essential hypertension, benign   .  GERD (gastroesophageal reflux disease)   . Headache   . Hemorrhoids   . Mixed hyperlipidemia   . PONV (postoperative nausea and vomiting)   . Renal insufficiency   . S/P colonoscopy Jan 2011   Dr. Benson Norway: sessile polyp (benign lymphoid), large hemorrhoids, repeat 5-10 years  . Temporal arteritis ([place])   . Type 2 diabetes mellitus ([place])   . Wears glasses     SURGICAL HISTORY: Past  Surgical History:  Procedure Laterality Date  . ABDOMINAL HYSTERECTOMY    . APPENDECTOMY    . ARTERY BIOPSY N/A 05/09/2018   Procedure: RIGHT TEMPORAL ARTERY BIOPSY;  Surgeon: Judeth Horn, MD;  Location: [facility];  Service: General;  Laterality: N/A;  . CATARACT EXTRACTION W/PHACO Left 02/09/2017   Procedure: CATARACT EXTRACTION PHACO AND INTRAOCULAR LENS PLACEMENT LEFT EYE;  Surgeon: Tonny Branch, MD;  Location: [facility];  Service: Ophthalmology;  Laterality: Left;  CDE: 4.89  . CATARACT EXTRACTION W/PHACO Right 06/04/2017   Procedure: CATARACT EXTRACTION PHACO AND INTRAOCULAR LENS PLACEMENT (IOC);  Surgeon: Tonny Branch, MD;  Location: [facility];  Service: Ophthalmology;  Laterality: Right;  CDE: 4.12  . CHOLECYSTECTOMY  09/29/2011   Procedure: LAPAROSCOPIC CHOLECYSTECTOMY;  Surgeon: Jamesetta So, MD;  Location: [facility];  Service: General;  Laterality: N/A;  . COLONOSCOPY  Jan 2011   Dr. Benson Norway: sessile polyp (benign lymphoid), large hemorrhoids, repeat 5-10 years  . COLONOSCOPY N/A 06/12/2016   Procedure: COLONOSCOPY;  Surgeon: Daneil Dolin, MD;  [facility];  Service: Endoscopy;  Laterality: N/A;  1230   . ESOPHAGOGASTRODUODENOSCOPY  09/05/2011   DTO:IZTIW hiatal hernia; remainder of exam normal. No explanation for patient's abdominal pain with today's examination  . ESOPHAGOGASTRODUODENOSCOPY N/A 12/17/2013   Dr. [name]: gastric erythema, erosion, mild chronic inflammation on path   . LAPAROSCOPIC APPENDECTOMY  09/29/2011   Procedure: APPENDECTOMY LAPAROSCOPIC;  Surgeon: Jamesetta So, MD;  Location: [facility];  Service: General;;  incidental appendectomy  . PARTIAL MASTECTOMY WITH NEEDLE LOCALIZATION AND AXILLARY SENTINEL LYMPH NODE BX Right 09/18/2018   Procedure: RIGHT PARTIAL MASTECTOMY AFTER NEEDLE LOCALIZATION, SENTINEL LYMPH NODE BIOPSY RIGHT AXILLA;  Surgeon: Aviva Signs, MD;  Location: [facility];  Service: General;  Laterality: Right;    SOCIAL HISTORY: Social History    Socioeconomic History  . Marital status: Married    Spouse name: Not on file  . Number of children: Not on file  . Years of education: Not on file  . Highest education level: Not on file  Occupational History  . Occupation: Merchandiser, retail: [employer] [phone]  Social Needs  . Financial resource strain: Not hard at all  . Food insecurity:    Worry: Never true    Inability: Never true  . Transportation needs:    Medical: No    Non-medical: No  Tobacco Use  . Smoking status: Never Smoker  . Smokeless tobacco: Never Used  Substance and Sexual Activity  . Alcohol use: No  . Drug use: No  . Sexual activity: Yes    Birth control/protection: Surgical  Lifestyle  . Physical activity:    Days per week: 0 days    Minutes per session: 0 min  . Stress: Not at all  Relationships  . Social connections:    Talks on phone: Twice a week    Gets together: Twice a week    Attends religious service: More than 4 times per year    Active member of club or  organization: No    Attends meetings of clubs or organizations: Never    Relationship status: Married  . Intimate partner violence:    Fear of current or ex partner: No    Emotionally abused: No    Physically abused: No    Forced sexual activity: No  Other Topics Concern  . Not on file  Social History Narrative   Works at [employer] in [place].    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   Also has to get items from high shelves-causes achy pain in shoulder area      Married.   Children are grown, out of house.    FAMILY HISTORY: Family History  Problem Relation Age of Onset  . Hypertension Mother   . Coronary artery disease Mother   . Diabetes Mother   . Hypertension Sister   . Coronary artery disease Sister   . Hypertension Brother   . Heart attack Father   . Hypertension Son   . Heart attack Maternal Aunt   . Hypertension Maternal Aunt   . Diabetes Maternal Aunt   . Heart attack Maternal Uncle   .  Hypertension Maternal Uncle   . Diabetes Maternal Uncle   . Heart attack Paternal Aunt   . Hypertension Paternal Aunt   . Diabetes Paternal Aunt   . Heart attack Paternal Uncle   . Hypertension Paternal Uncle   . Diabetes Paternal Uncle   . Heart attack Maternal Grandmother   . Heart attack Maternal Grandfather   . Heart attack Paternal Grandmother   . Heart attack Paternal Grandfather   . Colon cancer Neg Hx     ALLERGIES:  has No Known Allergies.  MEDICATIONS:  Current Outpatient Medications  Medication Sig Dispense Refill  . amLODipine (NORVASC) 5 MG tablet Take 1 tablet (5 mg total) by mouth daily. (Patient taking differently: Take 5 mg by mouth daily as needed (blood pressure 159 or above). ) 90 tablet 3  . aspirin EC 81 MG tablet Take 81 mg by mouth daily.    Marland Kitchen atorvastatin (LIPITOR) 20 MG tablet Take 1 tablet (20 mg total) by mouth daily. (Patient taking differently: Take 20 mg by mouth at bedtime. ) 90 tablet 1  . B Complex-C-Folic Acid (DIALYVITE 366) 0.8 MG TABS Take 800 mg by mouth daily.     . butalbital-acetaminophen-caffeine (FIORICET, ESGIC) 50-325-40 MG tablet Take 1 tablet by mouth every 6 (six) hours as needed for headache. 20 tablet 0  . cinacalcet (SENSIPAR) 30 MG tablet Take 2 tablets (60 mg total) by mouth daily. (Patient taking differently: Take 30 mg by mouth daily. ) 90 tablet 0  . gabapentin (NEURONTIN) 300 MG capsule TAKE 1 CAPSULE(300 MG) BY MOUTH AT BEDTIME 30 capsule 3  . HYDROcodone-acetaminophen (NORCO) 5-325 MG tablet Take 1 tablet by mouth every 4 (four) hours as needed for moderate pain. 30 tablet 0  . lanthanum (FOSRENOL) 1000 MG chewable tablet Chew 1,000-2,000 mg by mouth See admin instructions. Take 2000 mg by mouth twice daily with meals and 1000 with a snack    . losartan (COZAAR) 50 MG tablet Take 1 tablet (50 mg total) by mouth daily. (Patient taking differently: Take 50 mg by mouth at bedtime. ) 90 tablet 3  . nebivolol (BYSTOLIC) 10 MG tablet  Take 1 tablet (10 mg total) by mouth 2 (two) times daily. 180 tablet 3  . pantoprazole (PROTONIX) 40 MG tablet TAKE 1 TABLET(40 MG) BY MOUTH DAILY (  Patient taking differently: Take 40 mg by mouth daily before breakfast. ) 90 tablet 3  . tiZANidine (ZANAFLEX) 4 MG capsule TAKE 1 CAPSULE(4 MG) BY MOUTH TWICE DAILY WITH A MEAL. START 1 CAPSULE AT NIGHT FOR 2 WEEKS AND THEN TWICE DAILY 60 capsule 0  . ondansetron (ZOFRAN) 4 MG tablet Take 1 tablet (4 mg total) by mouth every 8 (eight) hours as needed for nausea or vomiting. (Patient not taking: Reported on 09/26/2018) 20 tablet 0   No current facility-administered medications for this visit.     REVIEW OF SYSTEMS:   Constitutional: Denies fevers, chills or abnormal night sweats Eyes: Denies blurriness of vision, double vision or watery eyes Ears, nose, mouth, throat, and face: Denies mucositis or sore throat Respiratory: Denies cough, dyspnea or wheezes Cardiovascular: Denies palpitation, chest discomfort or lower extremity swelling Gastrointestinal:  Denies nausea, heartburn or change in bowel habits Skin: Denies abnormal skin rashes Lymphatics: Denies new lymphadenopathy or easy bruising Neurological:Denies numbness, tingling or new weaknesses Behavioral/Psych: Mood is stable, no new changes  Breast: Denies any palpable lumps or discharge All other systems were reviewed with the patient and are negative.  PHYSICAL EXAMINATION: ECOG PERFORMANCE STATUS: 1 - Symptomatic but completely ambulatory  Vitals:   09/26/18 1200  BP: (!) 155/104  Pulse: 81  Resp: 18  Temp: 98.1 F (36.7 C)  SpO2: 96%   Filed Weights   09/26/18 1200  Weight: 173 lb 4 oz (78.6 kg)    GENERAL:alert, no distress and comfortable SKIN: skin color, texture, turgor are normal, no rashes or significant lesions EYES: normal, conjunctiva are pink and non-injected, sclera clear OROPHARYNX:no exudate, no erythema and lips, buccal mucosa, and tongue normal  NECK:  supple, thyroid normal size, non-tender, without nodularity LYMPH:  no palpable lymphadenopathy in the cervical, axillary or inguinal LUNGS: clear to auscultation and percussion with normal breathing effort HEART: regular rate & rhythm and no murmurs and no lower extremity edema ABDOMEN:abdomen soft, non-tender and normal bowel sounds Musculoskeletal:no cyanosis of digits and no clubbing  PSYCH: alert & oriented x 3 with fluent speech NEURO: no focal motor/sensory deficits BREAST:No palpable nodules in breast. No palpable axillary or supraclavicular lymphadenopathy (exam performed in the presence of a chaperone) right breast upper inner quadrant lumpectomy scar is healing well. - Left forearm fistula present.  LABORATORY DATA:  I have reviewed the data as listed Lab Results  Component Value Date   WBC 8.4 08/18/2018   HGB 10.7 (L) 08/18/2018   HCT 34.2 (L) 08/18/2018   MCV 108.2 (H) 08/18/2018   PLT 243 08/18/2018   Lab Results  Component Value Date   NA 139 08/18/2018   K 3.3 (L) 08/18/2018   CL 100 08/18/2018   CO2 26 08/18/2018    RADIOGRAPHIC STUDIES: I have personally reviewed the radiological reports and agreed with the findings in the report. I have reviewed Francene Finders, NP's note and agree with the documentation.  I personally performed a face-to-face visit, made revisions and my assessment and plan is as follows.  ASSESSMENT AND PLAN:  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[place]) 1.  Stage Ib (T1CN0) right breast cancer: -Screening mammogram on 02/18/2018 suggests right breast mass.  Right breast additional views on 02/26/2018 showed a 0.5 x 0.3 x 0.4 cm mass in the right breast.  Ultrasound biopsy was recommended. -Right breast biopsy on 03/05/2018 shows benign breast tissue with areas of dense hyalin fibrosis. -Right breast mammogram on 08/20/2018 shows suspicious mass  in the 1 o'clock position of the right breast with no adenopathy. -Left  breast mammogram on 08/27/2018 was BI-RADS Category 1. -Right breast biopsy on 08/27/2018 at 1:00 consistent with infiltrating ductal carcinoma. 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  ER 50% positive, PR negative, HER-2 negative, Ki 67-60%. - I discussed the pathology report with the patient, her sister and her husband in detail. -Because of high-grade disease, I have recommended doing Oncotype DX testing for further prognostication.  She is agreeable to this plan. -She had a partial hysterectomy 1995.  She does not report any menopausal symptoms. -We will see her back in 2 to 3 weeks for follow-up to discuss the results of the Oncotype DX.  At that point we will make a referral to radiation therapy based on the results.  2.  ESRD: - She has been on dialysis for the past 1 and half year.  ESRD was thought to be secondary to hypertension.  3.  Neuropathy: -She has developed neuropathy in the past 8 months.  Etiology unknown.  She has burning sensation in her bilateral toes.  No neuropathic symptoms in the hands. -She is taking gabapentin.   All questions were answered. The patient knows to call the clinic with any problems, questions or concerns.    Derek Jack, MD 09/26/18

## 2018-09-26 NOTE — Assessment & Plan Note (Addendum)
1.  Stage Ib (T1CN0) right breast cancer: -Screening mammogram on 02/18/2018 suggests right breast mass.  Right breast additional views on 02/26/2018 showed a 0.5 x 0.3 x 0.4 cm mass in the right breast.  Ultrasound biopsy was recommended. -Right breast biopsy on 03/05/2018 shows benign breast tissue with areas of dense hyalin fibrosis. -Right breast mammogram on 08/20/2018 shows suspicious mass in the 1 o'clock position of the right breast with no adenopathy. -Left breast mammogram on 08/27/2018 was BI-RADS Category 1. -Right breast biopsy on 08/27/2018 at 1:00 consistent with infiltrating ductal carcinoma. 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  ER 50% positive, PR negative, HER-2 negative, Ki 67-60%. - I discussed the pathology report with the patient, her sister and her husband in detail. -Because of high-grade disease, I have recommended doing Oncotype DX testing for further prognostication.  She is agreeable to this plan. -She had a partial hysterectomy 1995.  She does not report any menopausal symptoms. -We will see her back in 2 to 3 weeks for follow-up to discuss the results of the Oncotype DX.  At that point we will make a referral to radiation therapy based on the results.  2.  ESRD: - She has been on dialysis for the past 1 and half year.  ESRD was thought to be secondary to hypertension.  3.  Neuropathy: -She has developed neuropathy in the past 8 months.  Etiology unknown.  She has burning sensation in her bilateral toes.  No neuropathic symptoms in the hands. -She is taking gabapentin.

## 2018-09-26 NOTE — Patient Instructions (Signed)
[facility] at [facility] Discharge Instructions     Thank you for choosing [facility] at [facility] to provide your oncology and hematology care.  To afford each patient quality time with our provider, please arrive at least 15 minutes before your scheduled appointment time.   If you have a lab appointment with the Cancer Center please come in thru the  Main Entrance and check in at the main information desk  You need to re-schedule your appointment should you arrive 10 or more minutes late.  We strive to give you quality time with our providers, and arriving late affects you and other patients whose appointments are after yours.  Also, if you no show three or more times for appointments you may be dismissed from the clinic at the providers discretion.     Again, thank you for choosing [facility].  Our hope is that these requests will decrease the amount of time that you wait before being seen by our physicians.       _____________________________________________________________  Should you have questions after your visit to [facility], please contact our office at [phone] between the hours of 8:00 a.m. and 4:30 p.m.  Voicemails left after 4:00 p.m. will not be returned until the following business day.  For prescription refill requests, have your pharmacy contact our office and allow 72 hours.    Cancer Center Support Programs:   > Cancer Support Group  2nd Tuesday of the month 1pm-2pm, Journey Room    

## 2018-09-26 NOTE — Telephone Encounter (Signed)
Try resuming clonidine 0.1 mg poqam and recheck here in 1 week.  Before, her bp was dropping and that's why we stopped it.

## 2018-09-26 NOTE — Progress Notes (Signed)
Oncotype Dx testing sent out today. All forms faxed to pathology at [facility] and receipt confirmation verified.

## 2018-09-28 DIAGNOSIS — D631 Anemia in chronic kidney disease: Secondary | ICD-10-CM | POA: Diagnosis not present

## 2018-09-28 DIAGNOSIS — N186 End stage renal disease: Secondary | ICD-10-CM | POA: Diagnosis not present

## 2018-09-28 DIAGNOSIS — E1129 Type 2 diabetes mellitus with other diabetic kidney complication: Secondary | ICD-10-CM | POA: Diagnosis not present

## 2018-09-28 DIAGNOSIS — D509 Iron deficiency anemia, unspecified: Secondary | ICD-10-CM | POA: Diagnosis not present

## 2018-09-28 DIAGNOSIS — E119 Type 2 diabetes mellitus without complications: Secondary | ICD-10-CM | POA: Diagnosis not present

## 2018-09-28 DIAGNOSIS — N2581 Secondary hyperparathyroidism of renal origin: Secondary | ICD-10-CM | POA: Diagnosis not present

## 2018-10-01 DIAGNOSIS — N2581 Secondary hyperparathyroidism of renal origin: Secondary | ICD-10-CM | POA: Diagnosis not present

## 2018-10-01 DIAGNOSIS — E1129 Type 2 diabetes mellitus with other diabetic kidney complication: Secondary | ICD-10-CM | POA: Diagnosis not present

## 2018-10-01 DIAGNOSIS — D509 Iron deficiency anemia, unspecified: Secondary | ICD-10-CM | POA: Diagnosis not present

## 2018-10-01 DIAGNOSIS — E119 Type 2 diabetes mellitus without complications: Secondary | ICD-10-CM | POA: Diagnosis not present

## 2018-10-01 DIAGNOSIS — N186 End stage renal disease: Secondary | ICD-10-CM | POA: Diagnosis not present

## 2018-10-01 DIAGNOSIS — D631 Anemia in chronic kidney disease: Secondary | ICD-10-CM | POA: Diagnosis not present

## 2018-10-03 ENCOUNTER — Ambulatory Visit (INDEPENDENT_AMBULATORY_CARE_PROVIDER_SITE_OTHER): Payer: Medicare Other | Admitting: Family Medicine

## 2018-10-03 ENCOUNTER — Encounter: Payer: Self-pay | Admitting: Family Medicine

## 2018-10-03 VITALS — BP 142/78 | HR 88 | Temp 97.9°F | Resp 16 | Ht 62.0 in | Wt 174.0 lb

## 2018-10-03 DIAGNOSIS — D631 Anemia in chronic kidney disease: Secondary | ICD-10-CM | POA: Diagnosis not present

## 2018-10-03 DIAGNOSIS — I998 Other disorder of circulatory system: Secondary | ICD-10-CM

## 2018-10-03 DIAGNOSIS — N2581 Secondary hyperparathyroidism of renal origin: Secondary | ICD-10-CM | POA: Diagnosis not present

## 2018-10-03 DIAGNOSIS — E1129 Type 2 diabetes mellitus with other diabetic kidney complication: Secondary | ICD-10-CM | POA: Diagnosis not present

## 2018-10-03 DIAGNOSIS — E119 Type 2 diabetes mellitus without complications: Secondary | ICD-10-CM | POA: Diagnosis not present

## 2018-10-03 DIAGNOSIS — D509 Iron deficiency anemia, unspecified: Secondary | ICD-10-CM | POA: Diagnosis not present

## 2018-10-03 DIAGNOSIS — G609 Hereditary and idiopathic neuropathy, unspecified: Secondary | ICD-10-CM | POA: Diagnosis not present

## 2018-10-03 DIAGNOSIS — N186 End stage renal disease: Secondary | ICD-10-CM | POA: Diagnosis not present

## 2018-10-03 MED ORDER — DULOXETINE HCL 30 MG PO CPEP: 30.0000 mg | ORAL_CAPSULE | Freq: Every day | ORAL | 3 refills | Status: DC

## 2018-10-03 NOTE — Progress Notes (Signed)
Subjective:    Patient ID: Megan [name], female    DOB: [birth year], 58 y.o.   MRN: [id number]  HPI 02/22/18 Patient reports pain on both sides of her neck x8 months.  The pain is located over the trapezius just above the right clavicle mainly.  She is tender to palpation in that area.  She states the pain is also present on the left side of her neck though not as severe.  She originally saw orthopedics who performed an x-ray of the neck and saw no significant abnormalities per her report.  She was then referred to physical therapy and the patient states that physical therapy provided no benefit.  According to the patient, the orthopedist tried her on gabapentin without benefit, they also suggested "shots in her neck" however the patient declined this.  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  She denies any falls or injuries.  She has had occasional episodes of pain radiate from her neck all the way down her right arm into her right hand.  These occur sporadically.  The last occurrence has been more than 2 months ago.  However the pain in the trapezius muscle is constant every day.  At that time, my plan was: Differential diagnosis includes chronic muscle pain in the neck versus cervical radiculopathy versus fibromyalgia.  Patient has trialed and failed gabapentin as well as physical therapy under the care of an orthopedist.  Reportedly x-rays of the cervical spine were normal.  I have recommended a trial of muscle relaxers because I believe the majority of her pain is muscle pain.  I recommended Zanaflex every 6-8 hours as needed for pain and also applying heat when necessary for pain.  If neck pain is not improving at that point, I would recommend an MRI to evaluate for degenerative disc disease or cervical radiculopathy.  If no improvement or pathology is seen on the MRI, the next step then would be a referral to a pain clinic for possible trigger point  injections/Botox injections depending upon the recommendation.  We could also consider Cymbalta for possible fibromyalgia given her relatively benign exam.  04/23/18 Patient presents today with a different complaint.  She reports 2 to 3 weeks of bilateral headache.  The headache is located on both temples.  She describes it as a pulsing severe pain.  Still complains of some mild blurry vision.  Denies any photophobia.  She denies any phonophobia.  She denies any nausea or vomiting.  She denies any head traumas or recent concussion.  Her blood pressure is well controlled at 136/74.  She denies any numbness or tingling anywhere on her body.  She does continue to have neck pain and Zanaflex provided no relief.  She denies any jaw claudication.  She denies any pain with chewing.  Originally on her exam I palpated the TMJ area bilaterally and she states that is where the pain was.  However further on exam, she states is over the temporal artery.  She also reports heartburn unrelieved by Zantac.  Pepcid works but they do not want her to take Pepcid.  At that time, my plan was: Potential diagnosis includes temporal arteritis although the patient's younger than what I would expect for this, tension headache, TMJ, trigeminal neuralgia.  I will start the patient on a prednisone taper pack and obtain a sed rate.  If the sedimentation rate is normal, I would not proceed with a temporal artery biopsy given her age.  If the sedimentation rate is elevated, that would however be the next step.  Meanwhile I will treat the patient empirically with a prednisone taper pack.  If no better, I would switch treatment to muscle relaxers for tension headaches/TMJ.  Await the results of the sed rate and her response to prednisone.  Discontinue Zantac and replace with Protonix 40 mg p.o. every morning with food for recalcitrant acid reflux  05/16/18 Patient sedimentation rate was elevated at 87.  Patient ultimately underwent temporal  artery biopsy to rule out temporal arteritis.  Biopsy revealed no evidence of temporal arteritis and she saw no improvement with the prednisone taper pack for her headache.  She continues to endorse bilateral temporal headache on a daily basis.  It has been constant now for over 6 weeks.  Zanaflex does not help the pain but only makes her sleepy.  She also reports diplopia.  She denies any vomiting but she does report nausea with a headache.  She denies any altered mental status.  She denies any other neurologic deficit.  She denies any confusion.  Headache is nonpulsatile.  There is no photophobia or phonophobia area there is no aura.  She did have migraines as a child.  At that time, my plan was: Given the persistent nature of the headache and the neurologic deficit of diplopia, I will proceed with an MRI of the brain to rule out structural lesions that can cause chronic daily headache and diplopia.  Meanwhile start the patient on Topamax 25 mg p.o. nightly and increase by 25 mg weekly until she is on 50 mg p.o. twice daily in an effort to stop the headaches from happening.  Temporal artery biopsy was negative and therefore I believe temporal arteritis has been adequately removed from the differential diagnosis despite elevated sedimentation rate.  TMJ is certainly still in the differential diagnosis however this would not explain the diplopia.  Atypical migraines also would not explain the diplopia but I think are highly likely.  If Topamax does not help an MRI shows no abnormality, we will consult neurology  06/04/18 MRI was obtained of the brain given the persistent nature of the headaches: IMPRESSION: Mild atrophy.  Mild small vessel disease.  No acute stroke or intracranial hemorrhage.  No evidence for previous cortical infarct or large vessel occlusion to suggest complication of vasculitis. No explanation for the headaches was seen on the MRI.  She continues to have bilateral temporal headaches  as well as a frontal headache.  At times is moderate to severe.  She denies any change in her vision.  Again temporal artery biopsy was negative.  However the patient continues to have the elevated sedimentation rate.  She saw no improvement on a brief prednisone taper pack.  She is seen no improvement on Topamax.  Recently she has developed burning and stinging pain in the balls and toes of both feet.  It feels like it is on fire.  There is no erythema.  There is no warmth.  There is no swelling.  I am unable to reproduce the pain with palpation or range of motion.  Pain sounds neuropathic in nature.  At that time, my plan was: I believe the pain in her feet is likely neuropathic in nature.  Discontinue Topamax and start the patient on gabapentin 300 mg p.o. nightly.  She is on hemodialysis.  I will be hesitant to increase gabapentin beyond 300 mg daily but hopefully this would help with the neuropathic pain.  If it  is beneficial, we could try to increase up to a maximum of 300 mg 3 times a day.    I would also hope that the gabapentin may stop her headaches.  I will give her Fioricet.  She can take 1 tablet every 6 hours as needed for severe headache.  However I am still concerned given her elevated sedimentation rate.  Therefore I will also start the patient on prednisone 60 mg a day as well given the fact she is also having intermittent blurry vision.  This is being done despite the negative temporal artery biopsy.  However I would recheck the patient in 1 week to see if the headache is improving.  If improving in 1 week, we may discontinue gabapentin and Fioricet to see if the headaches return, to determine whether the gabapentin improves the headache or whether the prednisone improve the headache.  However I am concerned given the blurry vision about not adequately treating temporal arteritis immediately.    08/19/18 Recently, the patient's blood pressure has been fluctuating.  She has end-stage renal  disease and is on Tuesday Thursday Saturday dialysis.  On Saturday, her blood pressure has been running low and her nephrologist recommended discontinuing clonidine which the patient had been taking for her blood pressure.  She is also on Bystolic and losartan.  Sunday, her blood pressure spiked to as high as 220 systolic.  This concerned her so she resumed her clonidine.  Afterwards her blood pressure plummeted to 70 systolic making her extremely dizzy and she went to the emergency room.  After gentle rehydration, her systolic blood pressure rose greater than 100 and she was discharged home.  However this morning her blood pressure at home has been running high in the 254Y and 706C systolic.  Here she is 140/100.  She is not taking the clonidine again.  Her dialysis is scheduled again for tomorrow.  At that time, my plan was:  Recommended the patient stay away from the clonidine given the profound drop she is experiencing with it.  Instead we will try amlodipine 5 mg on days in which the patient does not have dialysis when her blood pressure tends to run high.  On days that she has dialysis or if her blood pressure is low she will not take the amlodipine.  10/03/18 Patient is here today to follow-up her blood pressure.  She called back last week, her blood pressure was running extremely high.  We resumed clonidine 0.1 mg.  She is taking it once a day.  Since resuming the clonidine, her blood pressure has been relatively well controlled.  Her systolic blood pressures are typically in the 140s.  Diastolic blood pressures are in the 70s to 90.  She denies any chest pain or shortness of breath with that.  She is using amlodipine only when her blood pressure is extremely high.  However her biggest issue at the present time is her neuropathic pain in both feet.  Desipramine gave the patient no relief.  Increasing gabapentin to 300 mg once a day has not provided her any relief.  She is currently on Norco due to the  neuropathic pain in her feet as well as her recent surgery that she had for breast cancer.  The Norco helps the pain slightly but she continues to report severe burning and stinging pain in both feet.  She is interested to see if there is any other options to treat the neuropathic pain Past Medical History:  Diagnosis Date  .  Anemia   . Blood transfusion without reported diagnosis   . Cataract   . CKD (chronic kidney disease) stage 3, GFR 30-59 ml/min (HCC)   . Essential hypertension, benign   . GERD (gastroesophageal reflux disease)   . Headache   . Hemorrhoids   . Mixed hyperlipidemia   . PONV (postoperative nausea and vomiting)   . Renal insufficiency   . S/P colonoscopy Jan 2011   Dr. Benson Norway: sessile polyp (benign lymphoid), large hemorrhoids, repeat 5-10 years  . Temporal arteritis ([place])   . Type 2 diabetes mellitus ([place])   . Wears glasses    Past Surgical History:  Procedure Laterality Date  . ABDOMINAL HYSTERECTOMY    . APPENDECTOMY    . ARTERY BIOPSY N/A 05/09/2018   Procedure: RIGHT TEMPORAL ARTERY BIOPSY;  Surgeon: Judeth Horn, MD;  Location: [facility];  Service: General;  Laterality: N/A;  . CATARACT EXTRACTION W/PHACO Left 02/09/2017   Procedure: CATARACT EXTRACTION PHACO AND INTRAOCULAR LENS PLACEMENT LEFT EYE;  Surgeon: Tonny Branch, MD;  Location: [facility];  Service: Ophthalmology;  Laterality: Left;  CDE: 4.89  . CATARACT EXTRACTION W/PHACO Right 06/04/2017   Procedure: CATARACT EXTRACTION PHACO AND INTRAOCULAR LENS PLACEMENT (IOC);  Surgeon: Tonny Branch, MD;  Location: [facility];  Service: Ophthalmology;  Laterality: Right;  CDE: 4.12  . CHOLECYSTECTOMY  09/29/2011   Procedure: LAPAROSCOPIC CHOLECYSTECTOMY;  Surgeon: Jamesetta So, MD;  Location: [facility];  Service: General;  Laterality: N/A;  . COLONOSCOPY  Jan 2011   Dr. Benson Norway: sessile polyp (benign lymphoid), large hemorrhoids, repeat 5-10 years  . COLONOSCOPY N/A 06/12/2016   Procedure: COLONOSCOPY;  Surgeon: Daneil Dolin,  MD;  [facility]: [facility];  Service: Endoscopy;  Laterality: N/A;  1230   . ESOPHAGOGASTRODUODENOSCOPY  09/05/2011   UEA:[id number] hiatal hernia; remainder of exam normal. No explanation for patient's abdominal pain with today's examination  . ESOPHAGOGASTRODUODENOSCOPY N/A 12/17/2013   Dr. [name]: gastric erythema, erosion, mild chronic inflammation on path   . LAPAROSCOPIC APPENDECTOMY  09/29/2011   Procedure: APPENDECTOMY LAPAROSCOPIC;  Surgeon: Jamesetta So, MD;  Location: [facility];  Service: General;;  incidental appendectomy  . PARTIAL MASTECTOMY WITH NEEDLE LOCALIZATION AND AXILLARY SENTINEL LYMPH NODE BX Right 09/18/2018   Procedure: RIGHT PARTIAL MASTECTOMY AFTER NEEDLE LOCALIZATION, SENTINEL LYMPH NODE BIOPSY RIGHT AXILLA;  Surgeon: Aviva Signs, MD;  Location: [facility];  Service: General;  Laterality: Right;   Current Outpatient Medications on File Prior to Visit  Medication Sig Dispense Refill  . amLODipine (NORVASC) 5 MG tablet Take 1 tablet (5 mg total) by mouth daily. (Patient taking differently: Take 5 mg by mouth daily as needed (blood pressure 159 or above). ) 90 tablet 3  . aspirin EC 81 MG tablet Take 81 mg by mouth daily.    Marland Kitchen atorvastatin (LIPITOR) 20 MG tablet Take 1 tablet (20 mg total) by mouth daily. (Patient taking differently: Take 20 mg by mouth at bedtime. ) 90 tablet 1  . B Complex-C-Folic Acid (DIALYVITE 119) 0.8 MG TABS Take 800 mg by mouth daily.     . butalbital-acetaminophen-caffeine (FIORICET, ESGIC) 50-325-40 MG tablet Take 1 tablet by mouth every 6 (six) hours as needed for headache. 20 tablet 0  . cinacalcet (SENSIPAR) 30 MG tablet Take 2 tablets (60 mg total) by mouth daily. (Patient taking differently: Take 30 mg by mouth daily. ) 90 tablet 0  . gabapentin (NEURONTIN) 300 MG capsule TAKE 1 CAPSULE(300 MG) BY MOUTH AT BEDTIME 30 capsule 3  .  HYDROcodone-acetaminophen (NORCO) 5-325 MG tablet Take 1 tablet by mouth every 4 (four) hours as needed for moderate pain. 30  tablet 0  . lanthanum (FOSRENOL) 1000 MG chewable tablet Chew 1,000-2,000 mg by mouth See admin instructions. Take 2000 mg by mouth twice daily with meals and 1000 with a snack    . losartan (COZAAR) 50 MG tablet Take 1 tablet (50 mg total) by mouth daily. (Patient taking differently: Take 50 mg by mouth at bedtime. ) 90 tablet 3  . nebivolol (BYSTOLIC) 10 MG tablet Take 1 tablet (10 mg total) by mouth 2 (two) times daily. 180 tablet 3  . ondansetron (ZOFRAN) 4 MG tablet Take 1 tablet (4 mg total) by mouth every 8 (eight) hours as needed for nausea or vomiting. 20 tablet 0  . pantoprazole (PROTONIX) 40 MG tablet TAKE 1 TABLET(40 MG) BY MOUTH DAILY (Patient taking differently: Take 40 mg by mouth daily before breakfast. ) 90 tablet 3  . tiZANidine (ZANAFLEX) 4 MG capsule TAKE 1 CAPSULE(4 MG) BY MOUTH TWICE DAILY WITH A MEAL. START 1 CAPSULE AT NIGHT FOR 2 WEEKS AND THEN TWICE DAILY 60 capsule 0   No current facility-administered medications on file prior to visit.    No Known Allergies Social History   Socioeconomic History  . Marital status: Married    Spouse name: Not on file  . Number of children: Not on file  . Years of education: Not on file  . Highest education level: Not on file  Occupational History  . Occupation: Merchandiser, retail: [employer] [phone]  Social Needs  . Financial resource strain: Not hard at all  . Food insecurity:    Worry: Never true    Inability: Never true  . Transportation needs:    Medical: No    Non-medical: No  Tobacco Use  . Smoking status: Never Smoker  . Smokeless tobacco: Never Used  Substance and Sexual Activity  . Alcohol use: No  . Drug use: No  . Sexual activity: Yes    Birth control/protection: Surgical  Lifestyle  . Physical activity:    Days per week: 0 days    Minutes per session: 0 min  . Stress: Not at all  Relationships  . Social connections:    Talks on phone: Twice a week    Gets together: Twice a week    Attends  religious service: More than 4 times per year    Active member of club or organization: No    Attends meetings of clubs or organizations: Never    Relationship status: Married  . Intimate partner violence:    Fear of current or ex partner: No    Emotionally abused: No    Physically abused: No    Forced sexual activity: No  Other Topics Concern  . Not on file  Social History Narrative   Works at [employer] in [place].    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   Also has to get items from high shelves-causes achy pain in shoulder area      Married.   Children are grown, out of house.     Review of Systems  All other systems reviewed and are negative.      Objective:   Physical Exam  Constitutional: She is oriented to person, place, and time. She appears well-developed and well-nourished. No distress.  HENT:  Right Ear: Tympanic membrane, external ear and ear canal normal.  Left Ear: Tympanic membrane, external ear and ear canal normal.  Nose: Nose normal.  Mouth/Throat: Oropharynx is clear and moist. No oropharyngeal exudate.  Eyes: Conjunctivae are normal.  Neck: Neck supple.  Cardiovascular: Normal rate, regular rhythm and normal heart sounds.  Pulmonary/Chest: Effort normal and breath sounds normal. No respiratory distress. She has no wheezes. She has no rales.  Musculoskeletal:     Right foot: Normal range of motion and normal capillary refill. No tenderness, bony tenderness, swelling, crepitus, deformity or laceration.     Left foot: Normal range of motion. No tenderness, bony tenderness, swelling, crepitus, deformity or laceration.  Lymphadenopathy:    She has no cervical adenopathy.  Neurological: She is alert and oriented to person, place, and time. She displays normal reflexes. No cranial nerve deficit. She exhibits normal muscle tone. Coordination normal.  Skin: She is not diaphoretic.  Vitals reviewed.         Assessment & Plan:    Hypertension.  Adequately controlled today.  Continue clonidine 0.1 mg p.o. daily.  Supplement with amlodipine when her blood pressures greater than 150/90.  This most likely will occur on days in between her dialysis sessions.  She will not use the medication if her blood pressure is less than that.  Regarding the neuropathic pain in her legs, continue gabapentin.  She can use hydrocodone for breakthrough pain.  However we will add Cymbalta 30 mg a day to see if this will help the neuropathy

## 2018-10-04 ENCOUNTER — Ambulatory Visit: Payer: Medicare Other | Admitting: Family Medicine

## 2018-10-05 DIAGNOSIS — D509 Iron deficiency anemia, unspecified: Secondary | ICD-10-CM | POA: Diagnosis not present

## 2018-10-05 DIAGNOSIS — D631 Anemia in chronic kidney disease: Secondary | ICD-10-CM | POA: Diagnosis not present

## 2018-10-05 DIAGNOSIS — N2581 Secondary hyperparathyroidism of renal origin: Secondary | ICD-10-CM | POA: Diagnosis not present

## 2018-10-05 DIAGNOSIS — E1129 Type 2 diabetes mellitus with other diabetic kidney complication: Secondary | ICD-10-CM | POA: Diagnosis not present

## 2018-10-05 DIAGNOSIS — N186 End stage renal disease: Secondary | ICD-10-CM | POA: Diagnosis not present

## 2018-10-05 DIAGNOSIS — E119 Type 2 diabetes mellitus without complications: Secondary | ICD-10-CM | POA: Diagnosis not present

## 2018-10-08 ENCOUNTER — Encounter (HOSPITAL_COMMUNITY): Payer: Self-pay | Admitting: Hematology

## 2018-10-08 DIAGNOSIS — N186 End stage renal disease: Secondary | ICD-10-CM | POA: Diagnosis not present

## 2018-10-08 DIAGNOSIS — E1129 Type 2 diabetes mellitus with other diabetic kidney complication: Secondary | ICD-10-CM | POA: Diagnosis not present

## 2018-10-08 DIAGNOSIS — N2581 Secondary hyperparathyroidism of renal origin: Secondary | ICD-10-CM | POA: Diagnosis not present

## 2018-10-08 DIAGNOSIS — D509 Iron deficiency anemia, unspecified: Secondary | ICD-10-CM | POA: Diagnosis not present

## 2018-10-08 DIAGNOSIS — D631 Anemia in chronic kidney disease: Secondary | ICD-10-CM | POA: Diagnosis not present

## 2018-10-08 DIAGNOSIS — E119 Type 2 diabetes mellitus without complications: Secondary | ICD-10-CM | POA: Diagnosis not present

## 2018-10-10 DIAGNOSIS — N2581 Secondary hyperparathyroidism of renal origin: Secondary | ICD-10-CM | POA: Diagnosis not present

## 2018-10-10 DIAGNOSIS — N186 End stage renal disease: Secondary | ICD-10-CM | POA: Diagnosis not present

## 2018-10-10 DIAGNOSIS — D509 Iron deficiency anemia, unspecified: Secondary | ICD-10-CM | POA: Diagnosis not present

## 2018-10-10 DIAGNOSIS — E119 Type 2 diabetes mellitus without complications: Secondary | ICD-10-CM | POA: Diagnosis not present

## 2018-10-10 DIAGNOSIS — E1129 Type 2 diabetes mellitus with other diabetic kidney complication: Secondary | ICD-10-CM | POA: Diagnosis not present

## 2018-10-10 DIAGNOSIS — D631 Anemia in chronic kidney disease: Secondary | ICD-10-CM | POA: Diagnosis not present

## 2018-10-12 DIAGNOSIS — N2581 Secondary hyperparathyroidism of renal origin: Secondary | ICD-10-CM | POA: Diagnosis not present

## 2018-10-12 DIAGNOSIS — E1129 Type 2 diabetes mellitus with other diabetic kidney complication: Secondary | ICD-10-CM | POA: Diagnosis not present

## 2018-10-12 DIAGNOSIS — N186 End stage renal disease: Secondary | ICD-10-CM | POA: Diagnosis not present

## 2018-10-12 DIAGNOSIS — D509 Iron deficiency anemia, unspecified: Secondary | ICD-10-CM | POA: Diagnosis not present

## 2018-10-12 DIAGNOSIS — E119 Type 2 diabetes mellitus without complications: Secondary | ICD-10-CM | POA: Diagnosis not present

## 2018-10-12 DIAGNOSIS — D631 Anemia in chronic kidney disease: Secondary | ICD-10-CM | POA: Diagnosis not present

## 2018-10-13 DIAGNOSIS — Z992 Dependence on renal dialysis: Secondary | ICD-10-CM | POA: Diagnosis not present

## 2018-10-13 DIAGNOSIS — N186 End stage renal disease: Secondary | ICD-10-CM | POA: Diagnosis not present

## 2018-10-13 DIAGNOSIS — N040 Nephrotic syndrome with minor glomerular abnormality: Secondary | ICD-10-CM | POA: Diagnosis not present

## 2018-10-15 DIAGNOSIS — D631 Anemia in chronic kidney disease: Secondary | ICD-10-CM | POA: Diagnosis not present

## 2018-10-15 DIAGNOSIS — D509 Iron deficiency anemia, unspecified: Secondary | ICD-10-CM | POA: Diagnosis not present

## 2018-10-15 DIAGNOSIS — E119 Type 2 diabetes mellitus without complications: Secondary | ICD-10-CM | POA: Diagnosis not present

## 2018-10-15 DIAGNOSIS — N186 End stage renal disease: Secondary | ICD-10-CM | POA: Diagnosis not present

## 2018-10-15 DIAGNOSIS — N2581 Secondary hyperparathyroidism of renal origin: Secondary | ICD-10-CM | POA: Diagnosis not present

## 2018-10-15 DIAGNOSIS — E1129 Type 2 diabetes mellitus with other diabetic kidney complication: Secondary | ICD-10-CM | POA: Diagnosis not present

## 2018-10-17 DIAGNOSIS — E119 Type 2 diabetes mellitus without complications: Secondary | ICD-10-CM | POA: Diagnosis not present

## 2018-10-17 DIAGNOSIS — D509 Iron deficiency anemia, unspecified: Secondary | ICD-10-CM | POA: Diagnosis not present

## 2018-10-17 DIAGNOSIS — D631 Anemia in chronic kidney disease: Secondary | ICD-10-CM | POA: Diagnosis not present

## 2018-10-17 DIAGNOSIS — N2581 Secondary hyperparathyroidism of renal origin: Secondary | ICD-10-CM | POA: Diagnosis not present

## 2018-10-17 DIAGNOSIS — N186 End stage renal disease: Secondary | ICD-10-CM | POA: Diagnosis not present

## 2018-10-17 DIAGNOSIS — E1129 Type 2 diabetes mellitus with other diabetic kidney complication: Secondary | ICD-10-CM | POA: Diagnosis not present

## 2018-10-19 DIAGNOSIS — N186 End stage renal disease: Secondary | ICD-10-CM | POA: Diagnosis not present

## 2018-10-19 DIAGNOSIS — E1129 Type 2 diabetes mellitus with other diabetic kidney complication: Secondary | ICD-10-CM | POA: Diagnosis not present

## 2018-10-19 DIAGNOSIS — D509 Iron deficiency anemia, unspecified: Secondary | ICD-10-CM | POA: Diagnosis not present

## 2018-10-19 DIAGNOSIS — E119 Type 2 diabetes mellitus without complications: Secondary | ICD-10-CM | POA: Diagnosis not present

## 2018-10-19 DIAGNOSIS — D631 Anemia in chronic kidney disease: Secondary | ICD-10-CM | POA: Diagnosis not present

## 2018-10-19 DIAGNOSIS — N2581 Secondary hyperparathyroidism of renal origin: Secondary | ICD-10-CM | POA: Diagnosis not present

## 2018-10-22 ENCOUNTER — Other Ambulatory Visit (HOSPITAL_COMMUNITY): Payer: Medicare Other

## 2018-10-22 DIAGNOSIS — N2581 Secondary hyperparathyroidism of renal origin: Secondary | ICD-10-CM | POA: Diagnosis not present

## 2018-10-22 DIAGNOSIS — E119 Type 2 diabetes mellitus without complications: Secondary | ICD-10-CM | POA: Diagnosis not present

## 2018-10-22 DIAGNOSIS — D509 Iron deficiency anemia, unspecified: Secondary | ICD-10-CM | POA: Diagnosis not present

## 2018-10-22 DIAGNOSIS — D631 Anemia in chronic kidney disease: Secondary | ICD-10-CM | POA: Diagnosis not present

## 2018-10-22 DIAGNOSIS — E1129 Type 2 diabetes mellitus with other diabetic kidney complication: Secondary | ICD-10-CM | POA: Diagnosis not present

## 2018-10-22 DIAGNOSIS — N186 End stage renal disease: Secondary | ICD-10-CM | POA: Diagnosis not present

## 2018-10-24 DIAGNOSIS — N186 End stage renal disease: Secondary | ICD-10-CM | POA: Diagnosis not present

## 2018-10-24 DIAGNOSIS — D631 Anemia in chronic kidney disease: Secondary | ICD-10-CM | POA: Diagnosis not present

## 2018-10-24 DIAGNOSIS — D509 Iron deficiency anemia, unspecified: Secondary | ICD-10-CM | POA: Diagnosis not present

## 2018-10-24 DIAGNOSIS — E1129 Type 2 diabetes mellitus with other diabetic kidney complication: Secondary | ICD-10-CM | POA: Diagnosis not present

## 2018-10-24 DIAGNOSIS — N2581 Secondary hyperparathyroidism of renal origin: Secondary | ICD-10-CM | POA: Diagnosis not present

## 2018-10-24 DIAGNOSIS — E119 Type 2 diabetes mellitus without complications: Secondary | ICD-10-CM | POA: Diagnosis not present

## 2018-10-25 ENCOUNTER — Telehealth: Payer: Self-pay | Admitting: Family Medicine

## 2018-10-25 ENCOUNTER — Encounter (HOSPITAL_COMMUNITY): Payer: Self-pay | Admitting: Hematology

## 2018-10-25 ENCOUNTER — Inpatient Hospital Stay (HOSPITAL_COMMUNITY): Payer: Medicare Other | Attending: Hematology | Admitting: Hematology

## 2018-10-25 ENCOUNTER — Other Ambulatory Visit: Payer: Self-pay

## 2018-10-25 VITALS — BP 121/68 | HR 73 | Temp 97.5°F | Resp 17 | Wt 166.0 lb

## 2018-10-25 DIAGNOSIS — Z992 Dependence on renal dialysis: Secondary | ICD-10-CM | POA: Insufficient documentation

## 2018-10-25 DIAGNOSIS — Z5111 Encounter for antineoplastic chemotherapy: Secondary | ICD-10-CM | POA: Diagnosis not present

## 2018-10-25 DIAGNOSIS — C50211 Malignant neoplasm of upper-inner quadrant of right female breast: Secondary | ICD-10-CM | POA: Diagnosis not present

## 2018-10-25 DIAGNOSIS — G629 Polyneuropathy, unspecified: Secondary | ICD-10-CM | POA: Insufficient documentation

## 2018-10-25 DIAGNOSIS — Z170 Estrogen receptor positive status [ER+]: Secondary | ICD-10-CM | POA: Insufficient documentation

## 2018-10-25 DIAGNOSIS — N186 End stage renal disease: Secondary | ICD-10-CM | POA: Insufficient documentation

## 2018-10-25 DIAGNOSIS — G609 Hereditary and idiopathic neuropathy, unspecified: Secondary | ICD-10-CM

## 2018-10-25 DIAGNOSIS — I120 Hypertensive chronic kidney disease with stage 5 chronic kidney disease or end stage renal disease: Secondary | ICD-10-CM | POA: Insufficient documentation

## 2018-10-25 NOTE — Patient Instructions (Signed)
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  You will be given doxorubicin (Adriamycin) and cyclophosphamide (Cytoxan) every 2 weeks for four cycles.  The intent of treatment is to cure this cancer.  You will see the doctor regularly throughout treatment.  We will check your lab work prior to every treatment. The doctor monitors your response to treatment by the way you are feeling, your blood work, and scans periodically. There will be wait times while you are here for treatment.  It will take about 30 minutes to 1 hour for your lab work to result.  Then there will be wait times while pharmacy mixes your medications.    You will receive the following premedications prior to each treatment of Adriamycin and Cytoxan:   Aloxi - high powered nausea/vomiting prevention medication used for chemotherapy patients.  Emend - high powered nausea/vomiting prevention medication used for chemotherapy patients.  Dexamethasone - steroid - given to reduce the risk of you having an allergic type reaction to the chemotherapy. Dexamethasone can cause you to feel energized, nervous/anxious/jittery, make you have trouble sleeping, and/or make you feel hot/flushed in the face/neck and/or look pink/red in the face/neck. These side effects will pass as the medicine wears off.   Udenyca (Neulasta) - this medication is not chemo but being given because you have had chemo. It is usually given 24-27 hours after the completion of chemotherapy. This medication works by boosting your bone marrow's supply of white blood cells. White blood cells are what protect our bodies against infection. The medication is given in the form of a subcutaneous injection. It is given in the fatty tissue of your abdomen. It is a short needle. The major side effect of this medication is bone or muscle pain. The drug of choice to relieve or lessen the pain is Aleve  or Ibuprofen. If a physician has ever told you not to take Aleve or Ibuprofen - then don't take it. You should then take Tylenol/acetaminophen. Take either medication as the bottle directs you to.  The level of pain you experience as a result of this injection can range from none, to mild or moderate, or severe. Please let us know if you develop moderate or severe bone pain.   You can take Claritin 10 mg over the counter for a few days after receiving neulasta to help with the bone aches and pains.  Cyclophosphamide (Generic Name) Other Names: Cytoxan, Neosar  About This Drug Cyclophosphamide is a drug used to treat cancer. It is given in the vein (IV) or by mouth.  Takes 30 minutes for this drug to infuse.  Possible Side Effects (More Common) . Nausea and throwing up (vomiting). These symptoms may happen within a few hours after your treatment and may last up to 72 hours. Medicines are available to stop or lessen these side effects. . Bone marrow depression. This is a decrease in the number of white blood cells, red blood cells, and platelets. This may raise your risk of infection, make you tired and weak (fatigue), and raise your risk of bleeding. . Hair loss: You may notice hair getting thin. Some patients lose their hair. Hair loss is often complete scalp hair loss and can involve loss of eyebrows, eyelashes, and pubic hair. You may notice this a few days or weeks after treatment has started. Most often hair loss is temporary; your hair should grow back when treatment  is done. . Decreased appetite (decreased hunger) . Blurred vision . Soreness of the mouth and throat. You may have red areas, white patches, or sores that hurt. . Effects on the bladder. This drug may cause irritation and bleeding in the bladder. You may have blood in your urine. To help stop this, you will get extra fluids to help you pass more urine. You may get a drug called mesna, which helps to decrease irritation and  bleeding. You may also get a medicine to help you pass more urine. You may have a catheter (tube) placed in your bladder so that your bladder will be washed with this drug.  Possible Side Effects (Less Common) . Darkening of the skin or nails . Metallic taste in the mouth . Changes in lung tissue may happen with large amounts of this drug. These changes may not last forever, and your lung tissue may go back to normal. Sometimes these changes may not be seen for many years. You may get a cough or have trouble catching your breath.  Allergic Reactions   Serious allergic reactions including anaphylaxis are rare. While you are getting this drug in your vein (IV), tell your nurse right away if you have any of these symptoms of an allergic reaction: . Trouble catching your breath . Feeling like your tongue or throat are swelling . Feeling your heart beat quickly or in a not normal way (palpitations) . Feeling dizzy or lightheaded . Flushing, itching, rash, and/or hives  Treating Side Effects . Drink 6-8 cups of fluids each day unless your doctor has told you to limit your fluid intake due to some other health problem. A cup is 8 ounces of fluid. If you throw up or have loose bowel movements you should drink more fluids so that you do not become dehydrated (lack water in the body due to losing too much fluid). . Ask your doctor or nurse about medicine that is available to help stop or lessen nausea or throwing up. . Mouth care is very important. Your mouth care should consist of routine, gentle cleaning of your teeth or dentures and rinsing your mouth with a mixture of 1/2 teaspoon of salt in 8 ounces of water or  teaspoon of baking soda in 8 ounces of water. This should be done at least after each meal and at bedtime. . If you have mouth sores, avoid mouthwash that has alcohol. Also avoid alcohol and smoking because they can bother your mouth and throat. . Talk with your nurse about getting a wig  before you lose your hair. Also, call the [place] at 800-ACS-2345 to find out information about the " Look Good.Marland KitchenMarland KitchenFeel Better" program close to where you live. It is a free program where women undergoing chemotherapy learn about wigs, turbans and scarves as well as makeup techniques and skin and nail care.  Important Information . Whenever you tell a doctor or nurse your health history, always tell them that you have received cyclophosphamide in the past. . If you take this drug by mouth swallow the medicine whole. Do not chew, break or crush it. . You can take the medicine with or without food. If you have nausea, take it with food. Do not take the pills at bedtime.  Food and Drug Interactions There are no known interactions of cyclophosphamide with food. This drug may interact with other medicines. Tell your doctor and pharmacist about all the medicines and dietary supplements (vitamins, minerals, herbs and others) that  you are taking at this time. The safety and use of dietary supplements and alternative diets are often not known. Using these might affect your cancer or interfere with your treatment. Until more is known, you should not use dietary supplements or alternative diets without your cancer doctor's help.  When to Call the Doctor Call your doctor or nurse right away if you have any of these symptoms: . Fever of 100.5 F (38 C) or higher . Chills . Bleeding or bruising that is not normal . Blurred vision or other changes in eyesight . Pain when passing urine; blood in urine . Pain in your lower back or side . Wheezing or trouble breathing . Swelling of legs, ankles, or feet . Feeling dizzy or lightheaded . Feeling confused or agitated . Signs of liver problems: dark urine, pale bowel movements, bad stomach pain, feeling very tired and weak, unusual itching, or yellowing of the eyes or skin . Unusual thirst or passing urine often . Nausea that stops you from eating  or drinking . Throwing up more than 3 times a day  Call your doctor or nurse as soon as possible if any of these symptoms happen: . Pain in your mouth or throat that makes it hard to eat or drink . Nausea not relieved by prescribed medicines  Sexual Problems and Reproductive Concerns . Infertility warning: Sexual problems and reproduction concerns may happen. In both men and women, this drug may affect your ability to have children. This cannot be determined before your treatment. Talk with your doctor or nurse if you plan to have children. Ask for information on sperm or egg banking. . In men, this drug may interfere with your ability to make sperm, but it should not change your ability to have sexual relations. . In women, menstrual bleeding may become irregular or stop while you are getting this drug. Do not assume that you cannot become pregnant if you do not have a menstrual period. . Women may go through signs of menopause (change of life) like vaginal dryness or itching. Vaginal lubricants can be used to lessen vaginal dryness, itching, and pain during sexual relations. . Genetic counseling is available for you to talk about the effects of this drug therapy on future pregnancies. Also, a genetic counselor can look at the possible risk of problems in the unborn baby due to this medicine if an exposure happens during pregnancy. . Pregnancy warning: This drug may have harmful effects on the unborn child, so effective methods of birth control should be used during your cancer treatment. . Breast feeding warning: Women should not breast feed during treatment because this drug could enter the breast milk and badly harm a breast feeding baby  Doxorubicin (Generic Name) Other Names: Adriamycin, hydroxyl daunorubicin  About This Drug Doxorubicin is a drug used to treat cancer. This drug is given in the vein (IV).  This drug is an IV push over about 5-10 minutes.    Possible Side Effects (More  Common) . Bone marrow depression. This is a decrease in the number of white blood cells, red blood cells, and platelets. This may raise your risk of infection, make you tired and weak (fatigue), and raise your risk of bleeding. . Hair loss: Hair loss is often complete scalp hair loss and can involve loss of eyebrows, eyelashes, and pubic hair. You may notice this a few days or weeks after treatment has started. Most often hair loss is temporary; your hair should grow back when  treatment is done. . Nausea and throwing up (vomiting). These symptoms may happen within a few hours after your treatment and may last up to 24 hours. Medicines are available to stop or lessen these side effects. . Soreness of the mouth and throat. You may have red areas, white patches, or sores that hurt. . Change in the color of your urine to pink or red. This color change will go away in one to two days. . Effects on the heart: This drug can weaken the heart and lower heart function. Your heart function will be checked as needed. You may have trouble catching your breath, mainly during activities. You may also have trouble breathing while lying down, and have swelling in your ankles. . Sensitivity to light (photosensitivity). Photosensitivity means that you may become more sensitive to the effects of the sun, sun lamps, and tanning beds. Your eyes may water more, mostly in bright light. . Metallic taste in the mouth: This may change the taste of food and drinks . Decreased appetite (decreased hunger) . Darkening of the skin or nails . Weakness that interferes with your daily activities  Possible Side Effects (Less Common) . Skin and tissue irritation may involve redness, pain, warmth, or swelling at the IV site. This happens if the drug leaks out of the vein and into nearby tissue. . Changes in your liver function. Your doctor will check your liver function as needed. . This drug may cause an increased risk of developing a  second cancer  Allergic Reaction Serious allergic reactions, including anaphylaxis are rare. While you are getting this drug in your vein (IV), tell your nurse right away if you have any of these symptoms of an allergic reaction: . Trouble catching your breath . Feeling like your tongue or throat are swelling . Feeling your heart beat quickly or in a not normal way (palpitations) . Feeling dizzy or lightheaded . Flushing, itching, rash, and/or hives  Treating Side Effects . Drink 6-8 cups of fluids every day unless your doctor has told you to limit your fluid intake due to some other health problem. A cup is 8 ounces of fluid. If you vomit or have diarrhea, you should drink more fluids so that you do not become dehydrated (lack water in the body due to losing too much fluid). . Ask your doctor or nurse about medicine that is available to help stop or lessen nausea, throwing up, and/or loose bowel movements . Wear dark sunglasses and use sunscreen with SPF 30 or higher when you are outdoors even for a short time. Cover up when you are out in the sun. Wear wide-brimmed hats, long-sleeved shirts, and pants. Keep your neck, chest, and back covered. . Mouth care is very important. Your mouth care should consist of routine, gentle cleaning of your teeth or dentures and rinsing your mouth with a mixture of 1/2 teaspoon of salt in 8 ounces of water or  teaspoon of baking soda in 8 ounces of water. This should be done at least after each meal and at bedtime. . If you have mouth sores, avoid mouthwash that has alcohol. Avoid alcohol and smoking because they can bother your mouth and throat. . Talk with your nurse about getting a wig before you lose your hair. Also, call the [place] at 800-ACS-2345 to find out information about the "Look Good, Feel Better" program close to where you live. It is a free program where women getting chemotherapy can learn about wigs, turbans  and scarves as well as  makeup techniques and skin and nail care. . While you are getting this drug, please tell your nurse right away if you have any pain, redness, or swelling at the site of the IV infusion.  Food and Drug Interactions There are no known interactions of doxorubicin with food. This drug may interact with other medicines. Tell your doctor and pharmacist about all the medicines and dietary supplements (vitamins, minerals, herbs and others) that you are taking at this time. The safety and use of dietary supplements and alternative diets are often not known. Using these might affect your cancer or interfere with your treatment. Until more is known, you should not use dietary supplements or alternative diets without your cancer doctor's help.  When to Call the Doctor Call your doctor or nurse right away if you have any of these symptoms: . Fever of 100.5 F (38 C) or above . Chills . Easy bruising or bleeding . Wheezing or trouble breathing . Rash or itching . Feeling dizzy or lightheaded . Feeling that your heart is beating in a fast or not normal way (palpitations) . Loose bowel movements (diarrhea) more than 4 times a day or diarrhea with weakness or feeling lightheaded . Nausea that stops you from eating or drinking . Throwing up more than 3 times a day . Signs of liver problems: dark urine, pale bowel movements, bad stomach pain, feeling very tired and weak, unusual itching, or yellowing of the eyes or skin, . During the IV infusion, if you have pain, redness, or swelling at the site of the IV infusion, please tell your nurse right away  Call your doctor or nurse as soon as possible if any of these symptoms happen: . Decreased urine . Pain in your mouth or throat that makes it hard to eat or drink . Nausea and throwing up that is not relieved by prescribed medicines . Rash that is not relieved by prescribed medicines . Swelling of legs, ankles, or feet . Weight gain of 5 pounds in one week (fluid  retention) . Lasting loss of appetite or rapid weight loss of five pounds in a week . Fatigue that interferes with your daily activities . Extreme weakness that interferes with normal activities  Sexual Problems and Reproduction Concerns . Infertility warning: Sexual problems and reproduction concerns may happen. In both men and women, this drug may affect your ability to have children. This cannot be determined before your treatment. Talk with your doctor or nurse if you plan to have children. Ask for information on sperm or egg banking. . In men, this drug may interfere with your ability to make sperm, but it should not change your ability to have sexual relations. . In women, menstrual bleeding may become irregular or stop while you are getting this drug. Do not assume that you cannot become pregnant if you do not have a menstrual period.   SELF CARE ACTIVITIES WHILE ON CHEMOTHERAPY:  Hydration Increase your fluid intake 48 hours prior to treatment and drink at least 8 to 12 cups (64 ounces) of water/decaffeinated beverages per day after treatment. You can still have your cup of coffee or soda but these beverages do not count as part of your 8 to 12 cups that you need to drink daily. No alcohol intake.  Medications Continue taking your normal prescription medication as prescribed.  If you start any new herbal or new supplements please let us know first to make sure it is safe.  Mouth Care Have teeth cleaned professionally before starting treatment. Keep dentures and partial plates clean. Use soft toothbrush and do not use mouthwashes that contain alcohol. Biotene is a good mouthwash that is available at most pharmacies or may be ordered by calling [phone]. Use warm salt water gargles (1 teaspoon salt per 1 quart warm water) before and after meals and at bedtime. Or you may rinse with 2 tablespoons of three-percent hydrogen peroxide mixed in eight ounces of water. If you are still having  problems with your mouth or sores in your mouth please call the clinic. If you need dental work, please let the doctor know before you go for your appointment so that we can coordinate the best possible time for you in regards to your chemo regimen. You need to also let your dentist know that you are actively taking chemo. We may need to do labs prior to your dental appointment.  Skin Care Always use sunscreen that has not expired and with SPF (Sun Protection Factor) of 50 or higher. Wear hats to protect your head from the sun. Remember to use sunscreen on your hands, ears, face, & feet.  Use good moisturizing lotions such as udder cream, eucerin, or even Vaseline. Some chemotherapies can cause dry skin, color changes in your skin and nails.    . Avoid long, hot showers or baths. . Use gentle, fragrance-free soaps and laundry detergent. . Use moisturizers, preferably creams or ointments rather than lotions because the thicker consistency is better at preventing skin dehydration. Apply the cream or ointment within 15 minutes of showering. Reapply moisturizer at night, and moisturize your hands every time after you wash them.  Hair Loss (if your doctor says your hair will fall out)  . If your doctor says that your hair is likely to fall out, decide before you begin chemo whether you want to wear a wig. You may want to shop before treatment to match your hair color. . Hats, turbans, and scarves can also camouflage hair loss, although some people prefer to leave their heads uncovered. If you go bare-headed outdoors, be sure to use sunscreen on your scalp. . Cut your hair short. It eases the inconvenience of shedding lots of hair, but it also can reduce the emotional impact of watching your hair fall out. . Don't perm or color your hair during chemotherapy. Those chemical treatments are already damaging to hair and can enhance hair loss. Once your chemo treatments are done and your hair has grown back,  it's OK to resume dyeing or perming hair.  With chemotherapy, hair loss is almost always temporary. But when it grows back, it may be a different color or texture. In older adults who still had hair color before chemotherapy, the new growth may be completely gray.  Often, new hair is very fine and soft.  Infection Prevention Please wash your hands for at least 30 seconds using warm soapy water. Handwashing is the #1 way to prevent the spread of germs. Stay away from sick people or people who are getting over a cold. If you develop respiratory systems such as green/yellow mucus production or productive cough or persistent cough let us know and we will see if you need an antibiotic. It is a good idea to keep a pair of gloves on when going into grocery stores/[employer] to decrease your risk of coming into contact with germs on the carts, etc. Carry alcohol hand gel with you at all times and use it frequently  if out in public. If your temperature reaches 100.5 or higher please call the clinic and let us know.  If it is after hours or on the weekend please go to the ER if your temperature is over 100.5.  Please have your own personal thermometer at home to use.    Sex and bodily fluids If you are going to have sex, a condom must be used to protect the person that isn't taking chemotherapy. Chemo can decrease your libido (sex drive). For a few days after chemotherapy, chemotherapy can be excreted through your bodily fluids.  When using the toilet please close the lid and flush the toilet twice.  Do this for a few day after you have had chemotherapy.   Effects of chemotherapy on your sex life Some changes are simple and won't last long. They won't affect your sex life permanently.  Sometimes you may feel: . too tired . not strong enough to be very active . sick or sore  . not in the mood . anxious or low Your anxiety might not seem related to sex. For example, you may be worried about the cancer and how  your treatment is going. Or you may be worried about money, or about how you family are coping with your illness. These things can cause stress, which can affect your interest in sex. It's important to talk to your partner about how you feel. Remember - the changes to your sex life don't usually last long. There's usually no medical reason to stop having sex during chemo. The drugs won't have any long term physical effects on your performance or enjoyment of sex. Cancer can't be passed on to your partner during sex  Contraception It's important to use reliable contraception during treatment. Avoid getting pregnant while you or your partner are having chemotherapy. This is because the drugs may harm the baby. Sometimes chemotherapy drugs can leave a man or woman infertile.  This means you would not be able to have children in the future. You might want to talk to someone about permanent infertility. It can be very difficult to learn that you may no longer be able to have children. Some people find counselling helpful. There might be ways to preserve your fertility, although this is easier for men than for women. You may want to speak to a fertility expert. You can talk about sperm banking or harvesting your eggs. You can also ask about other fertility options, such as donor eggs. If you have or have had breast cancer, your doctor might advise you not to take the contraceptive pill. This is because the hormones in it might affect the cancer.  It is not known for sure whether or not chemotherapy drugs can be passed on through semen or secretions from the vagina. Because of this some doctors advise people to use a barrier method if you have sex during treatment. This applies to vaginal, anal or oral sex. Generally, doctors advise a barrier method only for the time you are actually having the treatment and for about a week after your treatment. Advice like this can be worrying, but this does not mean that you  have to avoid being intimate with your partner. You can still have close contact with your partner and continue to enjoy sex.  Animals If you have cats or birds we just ask that you not change the litter or change the cage.  Please have someone else do this for you while you are on chemotherapy.  Food Safety During and After Cancer Treatment Food safety is important for people both during and after cancer treatment. Cancer and cancer treatments, such as chemotherapy, radiation therapy, and stem cell/bone marrow transplantation, often weaken the immune system. This makes it harder for your body to protect itself from foodborne illness, also called food poisoning. Foodborne illness is caused by eating food that contains harmful bacteria, parasites, or viruses.  Foods to avoid Some foods have a higher risk of becoming tainted with bacteria. These include: Marland Kitchen Unwashed fresh fruit and vegetables, especially leafy vegetables that can hide dirt and other contaminants . Raw sprouts, such as alfalfa sprouts . Raw or undercooked beef, especially ground beef, or other raw or undercooked meat and poultry . Fatty, fried, or spicy foods immediately before or after treatment.  These can sit heavy on your stomach and make you feel nauseous. . Raw or undercooked shellfish, such as oysters. . Sushi and sashimi, which often contain raw fish.  . Unpasteurized beverages, such as unpasteurized fruit juices, raw milk, raw yogurt, or cider . Undercooked eggs, such as soft boiled, over easy, and poached; raw, unpasteurized eggs; or foods made with raw egg, such as homemade raw cookie dough and homemade mayonnaise  Simple steps for food safety  Shop smart. . Do not buy food stored or displayed in an unclean area. . Do not buy bruised or damaged fruits or vegetables. . Do not buy cans that have cracks, dents, or bulges. . Pick up foods that can spoil at the end of your shopping trip and store them in a cooler on the  way home.  Prepare and clean up foods carefully. . Rinse all fresh fruits and vegetables under running water, and dry them with a clean towel or paper towel. . Clean the top of cans before opening them. . After preparing food, wash your hands for 20 seconds with hot water and soap. Pay special attention to areas between fingers and under nails. . Clean your utensils and dishes with hot water and soap. Marland Kitchen Disinfect your kitchen and cutting boards using 1 teaspoon of liquid, unscented bleach mixed into 1 quart of water.    Dispose of old food. . Eat canned and packaged food before its expiration date (the "use by" or "best before" date). . Consume refrigerated leftovers within 3 to 4 days. After that time, throw out the food. Even if the food does not smell or look spoiled, it still may be unsafe. Some bacteria, such as Listeria, can grow even on foods stored in the refrigerator if they are kept for too long.  Take precautions when eating out. . At restaurants, avoid buffets and salad bars where food sits out for a long time and comes in contact with many people. Food can become contaminated when someone with a virus, often a norovirus, or another "bug" handles it. . Put any leftover food in a "to-go" container yourself, rather than having the server do it. And, refrigerate leftovers as soon as you get home. . Choose restaurants that are clean and that are willing to prepare your food as you order it cooked.   MEDICATIONS:  Compazine/Prochlorperazine 10mg  tablet. Take 1 tablet every 6 hours as needed for nausea/vomiting. (This can make you sleepy)   EMLA cream. Apply a quarter size amount to port site 1 hour prior to chemo. Do not rub in. Cover with plastic wrap.   Over-the-Counter Meds:  Colace - 100 mg capsules - take 2 capsules  daily.  If this doesn't help then you can increase to 2 capsules twice daily.  Call us if this does not help your bowels move.   Imodium 2mg  capsule. Take 2 capsules after the 1st loose stool and then 1 capsule every 2 hours until you go a total of 12 hours without having a loose stool. Call the [facility] if loose stools continue. If diarrhea occurs at bedtime, take 2 capsules at bedtime. Then take 2 capsules every 4 hours until morning. Call [facility].    Diarrhea Sheet   If you are having loose stools/diarrhea, please purchase Imodium and begin taking as outlined:  At the first sign of poorly formed or loose stools you should begin taking Imodium (loperamide) 2 mg capsules.  Take two tablets (4mg ) followed by one tablet (2mg ) every 2 hours - DO NOT EXCEED 8 tablets in 24 hours.  If it is bedtime and you are having loose stools, take 2 tablets at bedtime, then 2 tablets every 4 hours until morning.   Always call the [facility] if you are having loose stools/diarrhea that you can't get under control.  Loose stools/diarrhea leads to dehydration (loss of water) in your body.  We have other options of trying to get the loose stools/diarrhea to stop but you must let us know!   Constipation Sheet  Colace - 100 mg capsules - take 2 capsules daily.  If this doesn't help then you can increase to 2 capsules twice daily.  Please call if the above does not work for you.   Do not go more than 2 days without a bowel movement.  It is very important that you do not become constipated.  It will make you feel sick to your stomach (nausea) and can cause abdominal pain and vomiting.   Nausea Sheet   Compazine/Prochlorperazine 10mg  tablet. Take 1 tablet every 6 hours as needed for nausea/vomiting. (This can make you sleepy)  If you are having persistent nausea (nausea that does not stop) please call the [facility] and let us know the amount of nausea that you are experiencing.  If you begin to  vomit, you need to call the [facility] and if it is the weekend and you have vomited more than one time and can't get it to stop-go to the Emergency Room.  Persistent nausea/vomiting can lead to dehydration (loss of fluid in your body) and will make you feel terrible.   Ice chips, sips of clear liquids, foods that are @ room temperature, crackers, and toast tend to be better tolerated.   SYMPTOMS TO REPORT AS SOON AS POSSIBLE AFTER TREATMENT:   FEVER GREATER THAN 100.5 F  CHILLS WITH OR WITHOUT FEVER  NAUSEA AND VOMITING THAT IS NOT CONTROLLED WITH YOUR NAUSEA MEDICATION  UNUSUAL SHORTNESS OF BREATH  UNUSUAL BRUISING OR BLEEDING  TENDERNESS IN MOUTH AND THROAT WITH OR WITHOUT PRESENCE OF ULCERS  URINARY PROBLEMS  BOWEL PROBLEMS  UNUSUAL RASH      Wear comfortable clothing and clothing appropriate for easy access to any Portacath or PICC line. Let us know if there is anything that we can do to make your  therapy better!    What to do if you need assistance after hours or on the weekends: CALL [phone].  HOLD on the line, do not hang up.  You will hear multiple messages but at the end you will be connected with a nurse triage line.  They will contact the doctor if necessary.  Most of the time they will be able to assist you.  Do not call the hospital operator.      I have been informed and understand all of the instructions given to me and have received a copy. I have been instructed to call the clinic [phone] or my family physician as soon as possible for continued medical care, if indicated. I do not have any more questions at this time but understand that I may call the [facility] or the Patient Navigator at [phone] during office hours should I have questions or need assistance in obtaining follow-up care.

## 2018-10-25 NOTE — Progress Notes (Signed)
Oncology navigator note:  I met with patient after office visit today. Patient was accompanied by her sister and her husband.  She was given the options by Dr. Delton Coombes for chemotherapy.  Patient was agreeable to continue.  I provided patient with written educational material on what to expect during chemotherapy. I talked about the general flow of her treatment days. I talked with patient about the medications that will be given. I provided them with 3 copies of written education on her chemotherapy agents. Chemotherapy education packet discussed with pt and family in detail.  Discussed diagnosis and staging, and tx regimen.  Reviewed chemotherapy medications and side effects, as well as pre-medications.  Instructed on how to manage side effects at home, and when to call the clinic.  Importance of fever/chills discussed with pt and family. Discussed precautions to implement at home after receiving tx, as well as self care strategies. Phone numbers provided for clinic during regular working hours, also how to reach the clinic after hours and on weekends. Pt and family provided the opportunity to ask questions - all questions answered to pt's and family satisfaction.   I discussed with patient the process of getting a PICC line that was discussed previously by Dr. Delton Coombes. Patient and family were given the opportunity to ask questions and they were answered to their satisfaction. Patient was provided a copy of her upcoming schedule and a copy of my contact information should they have any questions once they get home.

## 2018-10-25 NOTE — Assessment & Plan Note (Signed)
1.  Stage Ib (T1CN0) right breast cancer: -Screening mammogram on 02/18/2018 suggests right breast mass.  Right breast additional views on 02/26/2018 showed a 0.5 x 0.3 x 0.4 cm mass in the right breast.  Ultrasound biopsy was recommended. -Right breast biopsy on 03/05/2018 shows benign breast tissue with areas of dense hyalin fibrosis. -Right breast mammogram on 08/20/2018 shows suspicious mass in the 1 o'clock position of the right breast with no adenopathy. -Left breast mammogram on 08/27/2018 was BI-RADS Category 1. -Right breast biopsy on 08/27/2018 at 1:00 consistent with infiltrating ductal carcinoma. 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  ER 50% positive, PR negative, HER-2 negative, Ki 67-60%. - We discussed the pathology report in detail. - Today we discussed about the Oncotype DX test results.  Recurrence score is 47.  Distant recurrence risk at 9 years with tamoxifen alone is 36%.  Absolute chemotherapy benefit is more than 15% with her recurrent score. - RT-PCR analysis of ER expression of the specimen shows ER was negative.  However ER was 50% positive by IHC. - Given the findings on the Oncotype DX, I have recommended adjuvant chemotherapy.  My recommendation for lymph node-negative cancer is 4 cycles of TC.  I have reviewed literature where TC can be given safely after hemodialysis.  However she has some burning pain in the toes.  Hence I have recommended switching it to 4 cycles of AC every 3 weeks. -I have ordered a 2D echocardiogram.  We had an extensive discussion about the potential side effects of this regimen. -We also discussed the need for central line.  We discussed Port-A-Cath versus PICC line.  Since this is just short duration chemotherapy we opted for a PICC line.  2.  ESRD: -She has been on HD for the past 1 and half year. -ESRD was thought to be secondary to  hypertension.  3.  Neuropathy: -She developed neuropathy in the past 8 months, etiology unknown.  She has burning sensation in the bottom of her both feet, predominantly in the toes.  No neuropathic symptoms in the hands. -She is currently taking Cymbalta and gabapentin.

## 2018-10-25 NOTE — Patient Instructions (Addendum)
Megan [name] at [facility] Discharge Instructions  You were seen today by Dr. Delton Coombes. He went over your recent test results. He will get you set up for a PICC placement. He will get you scheduled for an ECHO.  He will see you back in for labs and follow up after all of these are done.    Thank you for choosing [facility] to provide your oncology and hematology care.  To afford each patient quality time with our provider, please arrive at least 15 minutes before your scheduled appointment time.   If you have a lab appointment with the [facility] please come in thru the  Main Entrance and check in at the main information desk  You need to re-schedule your appointment should you arrive 10 or more minutes late.  We strive to give you quality time with our providers, and arriving late affects you and other patients whose appointments are after yours.  Also, if you no show three or more times for appointments you may be dismissed from the clinic at the providers discretion.     Again, thank you for choosing [facility].  Our hope is that these requests will decrease the amount of time that you wait before being seen by our physicians.       _____________________________________________________________  Should you have questions after your visit to [facility], please contact our office at [phone] between the hours of 8:00 a.m. and 4:30 p.m.  Voicemails left after 4:00 p.m. will not be returned until the following business day.  For prescription refill requests, have your pharmacy contact our office and allow 72 hours.    Cancer Center Support Programs:   > Cancer Support Group  2nd Tuesday of the month 1pm-2pm, Journey Room

## 2018-10-25 NOTE — Progress Notes (Signed)
I called [facility] [phone] and spoke with RN, Quita Skye, I updated him on the treatment plan for the patient.  He spoke with Dr. Marval Regal who agrees with the plan to place a PICC line for treatments.  I will place order for PICC line and call the vascular access team.

## 2018-10-25 NOTE — Telephone Encounter (Signed)
Pt called and states that the medication for her feet is not working and would like to know if we can maybe send her to a specialist?

## 2018-10-25 NOTE — Progress Notes (Signed)
[place], [place] [address]   CLINIC:  Medical Oncology/Hematology  PCP:  [name], MD [address] [place] [id number] [phone]   REASON FOR VISIT:  Follow-up for Stage Ib (T1CN0) right breast cancer  BRIEF ONCOLOGIC HISTORY:    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[place])   09/18/2018 Initial Diagnosis    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[place])    09/26/2018 Cancer Staging    Staging form: Breast, AJCC 8th Edition - Clinical stage from 09/26/2018: Stage IB (cT1c, cN0, cM0, G3, ER+, PR-, HER2-) - Signed by Derek Jack, MD on 09/26/2018      CANCER STAGING: Cancer Staging Malignant neoplasm of upper-inner quadrant of right female breast [facility]) Staging form: Breast, AJCC 8th Edition - Clinical stage from 09/26/2018: Stage IB (cT1c, cN0, cM0, G3, ER+, PR-, HER2-) - Signed by Derek Jack, MD on 09/26/2018    INTERVAL HISTORY:  Ms. Bevan 58 y.o. female returns for routine follow-up. She is here today with her family. She was educated about her cancer and recent testing that was done. All questions were answered. She states that she currently has numbness and tingling on the bottom of her feet, she is taking cymbalta for this. Denies any nausea, vomiting, or diarrhea. Denies any new pains. Had not noticed any recent bleeding such as epistaxis, hematuria or hematochezia. Denies recent chest pain on exertion, shortness of breath on minimal exertion, pre-syncopal episodes, or palpitations. Denies any recent fevers, infections, or recent hospitalizations. Patient reports appetite at 25% and energy level at 50%.    REVIEW OF SYSTEMS:  Review of Systems - Oncology   PAST MEDICAL/SURGICAL HISTORY:  Past Medical History:  Diagnosis Date  . Anemia   . Blood transfusion without reported diagnosis   . Cataract   . CKD (chronic kidney disease)  stage 3, GFR 30-59 ml/min (HCC)   . Essential hypertension, benign   . GERD (gastroesophageal reflux disease)   . Headache   . Hemorrhoids   . Mixed hyperlipidemia   . PONV (postoperative nausea and vomiting)   . Renal insufficiency   . S/P colonoscopy Jan 2011   Dr. Benson Norway: sessile polyp (benign lymphoid), large hemorrhoids, repeat 5-10 years  . Temporal arteritis ([place])   . Type 2 diabetes mellitus ([place])   . Wears glasses    Past Surgical History:  Procedure Laterality Date  . ABDOMINAL HYSTERECTOMY    . APPENDECTOMY    . ARTERY BIOPSY N/A 05/09/2018   Procedure: RIGHT TEMPORAL ARTERY BIOPSY;  Surgeon: Judeth Horn, MD;  Location: [facility];  Service: General;  Laterality: N/A;  . CATARACT EXTRACTION W/PHACO Left 02/09/2017   Procedure: CATARACT EXTRACTION PHACO AND INTRAOCULAR LENS PLACEMENT LEFT EYE;  Surgeon: Tonny Branch, MD;  Location: [facility];  Service: Ophthalmology;  Laterality: Left;  CDE: 4.89  . CATARACT EXTRACTION W/PHACO Right 06/04/2017   Procedure: CATARACT EXTRACTION PHACO AND INTRAOCULAR LENS PLACEMENT (IOC);  Surgeon: Tonny Branch, MD;  Location: [facility];  Service: Ophthalmology;  Laterality: Right;  CDE: 4.12  . CHOLECYSTECTOMY  09/29/2011   Procedure: LAPAROSCOPIC CHOLECYSTECTOMY;  Surgeon: Jamesetta So, MD;  Location: [facility];  Service: General;  Laterality: N/A;  . COLONOSCOPY  Jan 2011   Dr. Benson Norway: sessile polyp (benign lymphoid), large hemorrhoids, repeat 5-10 years  . COLONOSCOPY N/A 06/12/2016   Procedure: COLONOSCOPY;  Surgeon: Daneil Dolin, MD;  [facility]: [facility];  Service: Endoscopy;  Laterality: N/A;  1230   . ESOPHAGOGASTRODUODENOSCOPY  09/05/2011   VQM:GQQPY hiatal hernia; remainder of exam normal. No explanation for patient's abdominal pain with today's examination  . ESOPHAGOGASTRODUODENOSCOPY N/A 12/17/2013   Dr. [name]: gastric erythema, erosion, mild chronic inflammation on path   . LAPAROSCOPIC APPENDECTOMY  09/29/2011   Procedure: APPENDECTOMY  LAPAROSCOPIC;  Surgeon: Jamesetta So, MD;  Location: [facility];  Service: General;;  incidental appendectomy  . PARTIAL MASTECTOMY WITH NEEDLE LOCALIZATION AND AXILLARY SENTINEL LYMPH NODE BX Right 09/18/2018   Procedure: RIGHT PARTIAL MASTECTOMY AFTER NEEDLE LOCALIZATION, SENTINEL LYMPH NODE BIOPSY RIGHT AXILLA;  Surgeon: Aviva Signs, MD;  Location: [facility];  Service: General;  Laterality: Right;     SOCIAL HISTORY:  Social History   Socioeconomic History  . Marital status: Married    Spouse name: Not on file  . Number of children: Not on file  . Years of education: Not on file  . Highest education level: Not on file  Occupational History  . 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 Financial resource strain: Not hard at all  . Food insecurity:    Worry: Never true    Inability: Never true  . Transportation needs:    Medical: No    Non-medical: No  Tobacco Use  . Smoking status: Never Smoker  . Smokeless tobacco: Never Used  Substance and Sexual Activity  . Alcohol use: No  . Drug use: No  . Sexual activity: Yes    Birth control/protection: Surgical  Lifestyle  . Physical activity:    Days per week: 0 days    Minutes per session: 0 min  . Stress: Not at all  Relationships  . Social connections:    Talks on phone: Twice a week    Gets together: Twice a week    Attends religious service: More than 4 times per year    Active member of club or organization: No    Attends meetings of clubs or organizations: Never    Relationship status: Married  . Intimate partner violence:    Fear of current or ex partner: No    Emotionally abused: No    Physically abused: No    Forced sexual activity: No  Other Topics Concern  . Not on file  Social History Narrative   Works at [employer] in [place].    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   Also has to get items from high shelves-causes achy pain in shoulder area      Married.    Children are grown, out of house.    FAMILY HISTORY:  Family History  Problem Relation Age of Onset  . Hypertension Mother   . Coronary artery disease Mother   . Diabetes Mother   . Hypertension Sister   . Coronary artery disease Sister   . Hypertension Brother   . Heart attack Father   . Hypertension Son   . Heart attack Maternal Aunt   . Hypertension Maternal Aunt   . Diabetes Maternal Aunt   . Heart attack Maternal Uncle   . Hypertension Maternal Uncle   . Diabetes Maternal Uncle   . Heart attack Paternal Aunt   . Hypertension Paternal Aunt   . Diabetes Paternal Aunt   . Heart attack Paternal Uncle   . Hypertension Paternal Uncle   . Diabetes Paternal Uncle   . Heart attack Maternal Grandmother   .  Heart attack Maternal Grandfather   . Heart attack Paternal Grandmother   . Heart attack Paternal Grandfather   . Colon cancer Neg Hx     CURRENT MEDICATIONS:  Outpatient Encounter Medications as of 10/25/2018  Medication Sig  . amLODipine (NORVASC) 5 MG tablet Take 1 tablet (5 mg total) by mouth daily. (Patient taking differently: Take 5 mg by mouth daily as needed (blood pressure 159 or above). )  . aspirin EC 81 MG tablet Take 81 mg by mouth daily.  Marland Kitchen atorvastatin (LIPITOR) 20 MG tablet Take 1 tablet (20 mg total) by mouth daily. (Patient taking differently: Take 20 mg by mouth at bedtime. )  . B Complex-C-Folic Acid (DIALYVITE 735) 0.8 MG TABS Take 800 mg by mouth daily.   . cinacalcet (SENSIPAR) 30 MG tablet Take 2 tablets (60 mg total) by mouth daily. (Patient taking differently: Take 30 mg by mouth daily. )  . cloNIDine (CATAPRES) 0.1 MG tablet Take 0.1 mg by mouth 2 (two) times daily.  . DULoxetine (CYMBALTA) 30 MG capsule Take 1 capsule (30 mg total) by mouth daily.  Marland Kitchen gabapentin (NEURONTIN) 300 MG capsule TAKE 1 CAPSULE(300 MG) BY MOUTH AT BEDTIME  . HYDROcodone-acetaminophen (NORCO) 5-325 MG tablet Take 1 tablet by mouth every 4 (four) hours as needed for moderate  pain.  Marland Kitchen lanthanum (FOSRENOL) 1000 MG chewable tablet Chew 1,000-2,000 mg by mouth See admin instructions. Take 2000 mg by mouth twice daily with meals and 1000 with a snack  . losartan (COZAAR) 50 MG tablet Take 1 tablet (50 mg total) by mouth daily. (Patient taking differently: Take 50 mg by mouth at bedtime. )  . nebivolol (BYSTOLIC) 10 MG tablet Take 1 tablet (10 mg total) by mouth 2 (two) times daily.  . ondansetron (ZOFRAN) 4 MG tablet Take 1 tablet (4 mg total) by mouth every 8 (eight) hours as needed for nausea or vomiting.  . butalbital-acetaminophen-caffeine (FIORICET, ESGIC) 50-325-40 MG tablet Take 1 tablet by mouth every 6 (six) hours as needed for headache. (Patient not taking: Reported on 10/25/2018)  . pantoprazole (PROTONIX) 40 MG tablet TAKE 1 TABLET(40 MG) BY MOUTH DAILY (Patient taking differently: Take 40 mg by mouth daily before breakfast. )  . tiZANidine (ZANAFLEX) 4 MG capsule TAKE 1 CAPSULE(4 MG) BY MOUTH TWICE DAILY WITH A MEAL. START 1 CAPSULE AT NIGHT FOR 2 WEEKS AND THEN TWICE DAILY (Patient not taking: Reported on 10/25/2018)   No facility-administered encounter medications on file as of 10/25/2018.     ALLERGIES:  No Known Allergies   PHYSICAL EXAM:  ECOG Performance status: 1  Vitals:   10/25/18 1035  BP: 121/68  Pulse: 73  Resp: 17  Temp: (!) 97.5 F (36.4 C)  SpO2: 96%   Filed Weights   10/25/18 1035  Weight: 166 lb (75.3 kg)    Physical Exam Constitutional:      Appearance: Normal appearance.  Cardiovascular:     Rate and Rhythm: Normal rate and regular rhythm.  Pulmonary:     Effort: Pulmonary effort is normal.     Breath sounds: Normal breath sounds.  Musculoskeletal:        General: No swelling.  Skin:    General: Skin is warm.  Neurological:     General: No focal deficit present.     Mental Status: She is alert and oriented to person, place, and time.  Psychiatric:        Mood and Affect: Mood normal.  Behavior: Behavior  normal.      LABORATORY DATA:  I have reviewed the labs as listed.  CBC    Component Value Date/Time   WBC 8.4 08/18/2018 0001   RBC 3.16 (L) 08/18/2018 0001   HGB 10.7 (L) 08/18/2018 0001   HCT 34.2 (L) 08/18/2018 0001   PLT 243 08/18/2018 0001   MCV 108.2 (H) 08/18/2018 0001   MCH 33.9 08/18/2018 0001   MCHC 31.3 08/18/2018 0001   RDW 14.7 08/18/2018 0001   LYMPHSABS 2.6 08/18/2018 0001   MONOABS 1.0 08/18/2018 0001   EOSABS 0.2 08/18/2018 0001   BASOSABS 0.1 08/18/2018 0001   CMP Latest Ref Rng & Units 08/18/2018 05/09/2018 02/07/2018  Glucose 70 - 99 mg/dL 115(H) 113(H) 144(H)  BUN 6 - 20 mg/dL 47(H) - 17  Creatinine 0.44 - 1.00 mg/dL 10.28(H) - 5.26(H)  Sodium 135 - 145 mmol/L 139 140 138  Potassium 3.5 - 5.1 mmol/L 3.3(L) 3.6 2.9(L)  Chloride 98 - 111 mmol/L 100 - 95(L)  CO2 22 - 32 mmol/L 26 - 28  Calcium 8.9 - 10.3 mg/dL 9.1 - 8.3(L)  Total Protein 6.5 - 8.1 g/dL - - 8.6(H)  Total Bilirubin 0.3 - 1.2 mg/dL - - 0.4  Alkaline Phos 38 - 126 U/L - - 116  AST 15 - 41 U/L - - 26  ALT 0 - 44 U/L - - 30       DIAGNOSTIC IMAGING:  I have independently reviewed the scans and discussed with the patient.   I have reviewed Venita Lick LPN's note and agree with the documentation.  I personally performed a face-to-face visit, made revisions and my assessment and plan is as follows.    ASSESSMENT & PLAN:   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[place]) 1.  Stage Ib (T1CN0) right breast cancer: -Screening mammogram on 02/18/2018 suggests right breast mass.  Right breast additional views on 02/26/2018 showed a 0.5 x 0.3 x 0.4 cm mass in the right breast.  Ultrasound biopsy was recommended. -Right breast biopsy on 03/05/2018 shows benign breast tissue with areas of dense hyalin fibrosis. -Right breast mammogram on 08/20/2018 shows suspicious mass in the 1 o'clock position of the right breast with no adenopathy. -Left breast mammogram on 08/27/2018 was  BI-RADS Category 1. -Right breast biopsy on 08/27/2018 at 1:00 consistent with infiltrating ductal carcinoma. 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  ER 50% positive, PR negative, HER-2 negative, Ki 67-60%. - We discussed the pathology report in detail. - Today we discussed about the Oncotype DX test results.  Recurrence score is 47.  Distant recurrence risk at 9 years with tamoxifen alone is 36%.  Absolute chemotherapy benefit is more than 15% with her recurrent score. - RT-PCR analysis of ER expression of the specimen shows ER was negative.  However ER was 50% positive by IHC. - Given the findings on the Oncotype DX, I have recommended adjuvant chemotherapy.  My recommendation for lymph node-negative cancer is 4 cycles of TC.  I have reviewed literature where TC can be given safely after hemodialysis.  However she has some burning pain in the toes.  Hence I have recommended switching it to 4 cycles of AC every 3 weeks. -I have ordered a 2D echocardiogram.  We had an extensive discussion about the potential side effects of this regimen. -We also discussed the need for central line.  We  discussed Port-A-Cath versus PICC line.  Since this is just short duration chemotherapy we opted for a PICC line.  2.  ESRD: -She has been on HD for the past 1 and half year. -ESRD was thought to be secondary to hypertension.  3.  Neuropathy: -She developed neuropathy in the past 8 months, etiology unknown.  She has burning sensation in the bottom of her both feet, predominantly in the toes.  No neuropathic symptoms in the hands. -She is currently taking Cymbalta and gabapentin.   Total time spent is 40 minutes with more than 50% of the time spent face-to-face discussing Oncotype DX results, therapy recommendations, side effects and coordination of care.    Orders placed this encounter:  Orders Placed  This Encounter  Procedures  . ECHOCARDIOGRAM COMPLETE      Derek Jack, DISH [phone]

## 2018-10-26 DIAGNOSIS — E1129 Type 2 diabetes mellitus with other diabetic kidney complication: Secondary | ICD-10-CM | POA: Diagnosis not present

## 2018-10-26 DIAGNOSIS — D631 Anemia in chronic kidney disease: Secondary | ICD-10-CM | POA: Diagnosis not present

## 2018-10-26 DIAGNOSIS — N2581 Secondary hyperparathyroidism of renal origin: Secondary | ICD-10-CM | POA: Diagnosis not present

## 2018-10-26 DIAGNOSIS — N186 End stage renal disease: Secondary | ICD-10-CM | POA: Diagnosis not present

## 2018-10-26 DIAGNOSIS — E119 Type 2 diabetes mellitus without complications: Secondary | ICD-10-CM | POA: Diagnosis not present

## 2018-10-26 DIAGNOSIS — D509 Iron deficiency anemia, unspecified: Secondary | ICD-10-CM | POA: Diagnosis not present

## 2018-10-28 ENCOUNTER — Inpatient Hospital Stay (HOSPITAL_COMMUNITY): Payer: Medicare Other

## 2018-10-28 ENCOUNTER — Other Ambulatory Visit (HOSPITAL_COMMUNITY): Payer: Self-pay | Admitting: Hematology

## 2018-10-28 ENCOUNTER — Other Ambulatory Visit: Payer: Self-pay

## 2018-10-28 ENCOUNTER — Ambulatory Visit (HOSPITAL_COMMUNITY)
Admission: RE | Admit: 2018-10-28 | Discharge: 2018-10-28 | Disposition: A | Discharge status: Home / Self Care | Payer: Medicare Other | Source: Ambulatory Visit | Attending: Hematology | Admitting: Hematology

## 2018-10-28 DIAGNOSIS — N183 Chronic kidney disease, stage 3 (moderate): Secondary | ICD-10-CM | POA: Insufficient documentation

## 2018-10-28 DIAGNOSIS — Z170 Estrogen receptor positive status [ER+]: Secondary | ICD-10-CM | POA: Diagnosis not present

## 2018-10-28 DIAGNOSIS — E1122 Type 2 diabetes mellitus with diabetic chronic kidney disease: Secondary | ICD-10-CM | POA: Insufficient documentation

## 2018-10-28 DIAGNOSIS — E785 Hyperlipidemia, unspecified: Secondary | ICD-10-CM | POA: Diagnosis not present

## 2018-10-28 DIAGNOSIS — I129 Hypertensive chronic kidney disease with stage 1 through stage 4 chronic kidney disease, or unspecified chronic kidney disease: Secondary | ICD-10-CM | POA: Diagnosis not present

## 2018-10-28 DIAGNOSIS — Z5111 Encounter for antineoplastic chemotherapy: Secondary | ICD-10-CM | POA: Diagnosis not present

## 2018-10-28 DIAGNOSIS — I120 Hypertensive chronic kidney disease with stage 5 chronic kidney disease or end stage renal disease: Secondary | ICD-10-CM | POA: Diagnosis not present

## 2018-10-28 DIAGNOSIS — C50211 Malignant neoplasm of upper-inner quadrant of right female breast: Secondary | ICD-10-CM

## 2018-10-28 DIAGNOSIS — G629 Polyneuropathy, unspecified: Secondary | ICD-10-CM | POA: Diagnosis not present

## 2018-10-28 DIAGNOSIS — N186 End stage renal disease: Secondary | ICD-10-CM | POA: Diagnosis not present

## 2018-10-28 LAB — COMPREHENSIVE METABOLIC PANEL
ALT: 19 U/L (ref 0–44)
AST: 14 U/L — ABNORMAL LOW (ref 15–41)
Albumin: 3.6 g/dL (ref 3.5–5.0)
Alkaline Phosphatase: 71 U/L (ref 38–126)
Anion gap: 13 (ref 5–15)
BUN: 44 mg/dL — ABNORMAL HIGH (ref 6–20)
CO2: 29 mmol/L (ref 22–32)
Calcium: 9.5 mg/dL (ref 8.9–10.3)
Chloride: 99 mmol/L (ref 98–111)
Creatinine, Ser: 9.95 mg/dL — ABNORMAL HIGH (ref 0.44–1.00)
GFR calc Af Amer: 5 mL/min — ABNORMAL LOW (ref >60)
GFR calc non Af Amer: 4 mL/min — ABNORMAL LOW (ref >60)
Glucose, Bld: 105 mg/dL — ABNORMAL HIGH (ref 70–99)
Potassium: 3.5 mmol/L (ref 3.5–5.1)
Sodium: 141 mmol/L (ref 135–145)
Total Bilirubin: 0.5 mg/dL (ref 0.3–1.2)
Total Protein: 7 g/dL (ref 6.5–8.1)

## 2018-10-28 LAB — CBC WITH DIFFERENTIAL/PLATELET
Abs Immature Granulocytes: 0.01 10*3/uL (ref 0.00–0.07)
Basophils Absolute: 0.1 10*3/uL (ref 0.0–0.1)
Basophils Relative: 1 %
Eosinophils Absolute: 0.3 10*3/uL (ref 0.0–0.5)
Eosinophils Relative: 4 %
HCT: 35.4 % — ABNORMAL LOW (ref 36.0–46.0)
Hemoglobin: 10.8 g/dL — ABNORMAL LOW (ref 12.0–15.0)
Immature Granulocytes: 0 %
Lymphocytes Relative: 31 %
Lymphs Abs: 2.1 10*3/uL (ref 0.7–4.0)
MCH: 31.9 pg (ref 26.0–34.0)
MCHC: 30.5 g/dL (ref 30.0–36.0)
MCV: 104.4 fL — ABNORMAL HIGH (ref 80.0–100.0)
Monocytes Absolute: 0.7 10*3/uL (ref 0.1–1.0)
Monocytes Relative: 10 %
Neutro Abs: 3.7 10*3/uL (ref 1.7–7.7)
Neutrophils Relative %: 54 %
Platelets: 246 10*3/uL (ref 150–400)
RBC: 3.39 MIL/uL — ABNORMAL LOW (ref 3.87–5.11)
RDW: 14.8 % (ref 11.5–15.5)
WBC: 6.8 10*3/uL (ref 4.0–10.5)
nRBC: 0 % (ref 0.0–0.2)

## 2018-10-28 NOTE — Telephone Encounter (Signed)
Referral placed.

## 2018-10-28 NOTE — Telephone Encounter (Signed)
Sure she can see a neurologist for neuropathy

## 2018-10-28 NOTE — Progress Notes (Signed)
START OFF PATHWAY REGIMEN - Breast   [id number]:Doxorubicin + Cyclophosphamide (AC):   A cycle is every 21 days:     Doxorubicin      Cyclophosphamide   **Always confirm dose/schedule in your pharmacy ordering system**  Patient Characteristics: Postoperative without Neoadjuvant Therapy (Pathologic Staging), Invasive Disease, Adjuvant Therapy, HER2 Negative/Unknown/Equivocal, ER Positive, Node Negative, pT1a-c, pN0/N49m or pT2 or Higher, pN0, Oncotype High Risk (? 26) Therapeutic Status: Postoperative without Neoadjuvant Therapy (Pathologic Staging) AJCC Grade: G3 AJCC N Category: pN0 AJCC M Category: cM0 ER Status: Positive (+) AJCC 8 Stage Grouping: IA HER2 Status: Negative (-) Oncotype Dx Recurrence Score: [id number] T Category: pT1c PR Status: Negative (-) Has this patient completed genomic testing<= Yes - Oncotype DX(R) Intent of Therapy: Curative Intent, Discussed with Patient

## 2018-10-28 NOTE — Progress Notes (Signed)
*  PRELIMINARY RESULTS* Echocardiogram 2D Echocardiogram has been performed.  Samuel Germany 10/28/2018, 1:16 PM

## 2018-10-29 ENCOUNTER — Other Ambulatory Visit (HOSPITAL_COMMUNITY): Payer: Medicare Other

## 2018-10-29 ENCOUNTER — Inpatient Hospital Stay (HOSPITAL_COMMUNITY): Payer: Medicare Other

## 2018-10-29 DIAGNOSIS — E119 Type 2 diabetes mellitus without complications: Secondary | ICD-10-CM | POA: Diagnosis not present

## 2018-10-29 DIAGNOSIS — N186 End stage renal disease: Secondary | ICD-10-CM | POA: Diagnosis not present

## 2018-10-29 DIAGNOSIS — D631 Anemia in chronic kidney disease: Secondary | ICD-10-CM | POA: Diagnosis not present

## 2018-10-29 DIAGNOSIS — D509 Iron deficiency anemia, unspecified: Secondary | ICD-10-CM | POA: Diagnosis not present

## 2018-10-29 DIAGNOSIS — E1129 Type 2 diabetes mellitus with other diabetic kidney complication: Secondary | ICD-10-CM | POA: Diagnosis not present

## 2018-10-29 DIAGNOSIS — N2581 Secondary hyperparathyroidism of renal origin: Secondary | ICD-10-CM | POA: Diagnosis not present

## 2018-10-29 MED ORDER — PROCHLORPERAZINE MALEATE 10 MG PO TABS: 10.0000 mg | ORAL_TABLET | Freq: Four times a day (QID) | ORAL | 1 refills | Status: DC | PRN

## 2018-10-30 ENCOUNTER — Other Ambulatory Visit (HOSPITAL_COMMUNITY): Payer: Self-pay | Admitting: Nurse Practitioner

## 2018-10-30 ENCOUNTER — Other Ambulatory Visit: Payer: Self-pay

## 2018-10-30 ENCOUNTER — Encounter (HOSPITAL_COMMUNITY): Payer: Self-pay

## 2018-10-30 ENCOUNTER — Inpatient Hospital Stay (HOSPITAL_COMMUNITY): Payer: Medicare Other

## 2018-10-30 ENCOUNTER — Telehealth (HOSPITAL_COMMUNITY): Payer: Self-pay | Admitting: Emergency Medicine

## 2018-10-30 ENCOUNTER — Ambulatory Visit
Admission: RE | Admit: 2018-10-30 | Discharge: 2018-10-30 | Disposition: A | Discharge status: Home / Self Care | Payer: Self-pay | Source: Ambulatory Visit | Attending: Hematology | Admitting: Hematology

## 2018-10-30 VITALS — BP 144/71 | HR 77 | Temp 97.6°F | Resp 18 | Wt 165.5 lb

## 2018-10-30 DIAGNOSIS — I120 Hypertensive chronic kidney disease with stage 5 chronic kidney disease or end stage renal disease: Secondary | ICD-10-CM | POA: Diagnosis not present

## 2018-10-30 DIAGNOSIS — C50211 Malignant neoplasm of upper-inner quadrant of right female breast: Secondary | ICD-10-CM | POA: Diagnosis not present

## 2018-10-30 DIAGNOSIS — Z170 Estrogen receptor positive status [ER+]: Secondary | ICD-10-CM | POA: Diagnosis not present

## 2018-10-30 DIAGNOSIS — G629 Polyneuropathy, unspecified: Secondary | ICD-10-CM | POA: Diagnosis not present

## 2018-10-30 DIAGNOSIS — Z5111 Encounter for antineoplastic chemotherapy: Secondary | ICD-10-CM | POA: Diagnosis not present

## 2018-10-30 DIAGNOSIS — N186 End stage renal disease: Secondary | ICD-10-CM | POA: Diagnosis not present

## 2018-10-30 MED ORDER — SODIUM CHLORIDE 0.9 % IV SOLN
Freq: Once | INTRAVENOUS | Status: AC
  Administered 2018-10-30: 11:00:00 via INTRAVENOUS
  Filled 2018-10-30: qty 5

## 2018-10-30 MED ORDER — PEGFILGRASTIM 6 MG/0.6ML ~~LOC~~ PSKT
6.0000 mg | PREFILLED_SYRINGE | Freq: Once | SUBCUTANEOUS | Status: AC
  Administered 2018-10-30: 6 mg via SUBCUTANEOUS
  Filled 2018-10-30: qty 0.6

## 2018-10-30 MED ORDER — SODIUM CHLORIDE 0.9% FLUSH
10.0000 mL | INTRAVENOUS | Status: DC | PRN
  Administered 2018-10-30 (×2): 10 mL
  Filled 2018-10-30 (×2): qty 10

## 2018-10-30 MED ORDER — DOXORUBICIN HCL CHEMO IV INJECTION 2 MG/ML
60.0000 mg/m2 | Freq: Once | INTRAVENOUS | Status: AC
  Administered 2018-10-30: 108 mg via INTRAVENOUS
  Filled 2018-10-30: qty 54

## 2018-10-30 MED ORDER — PALONOSETRON HCL INJECTION 0.25 MG/5ML
0.2500 mg | Freq: Once | INTRAVENOUS | Status: AC
  Administered 2018-10-30: 0.25 mg via INTRAVENOUS
  Filled 2018-10-30: qty 5

## 2018-10-30 MED ORDER — SODIUM CHLORIDE 0.9 % IV SOLN
Freq: Once | INTRAVENOUS | Status: AC
  Administered 2018-10-30: 10:00:00 via INTRAVENOUS

## 2018-10-30 MED ORDER — HEPARIN SOD (PORK) LOCK FLUSH 100 UNIT/ML IV SOLN
500.0000 [IU] | Freq: Once | INTRAVENOUS | Status: AC | PRN
  Administered 2018-10-30: 500 [IU]
  Filled 2018-10-30: qty 5

## 2018-10-30 MED ORDER — SODIUM CHLORIDE 0.9 % IV SOLN
450.0000 mg/m2 | Freq: Once | INTRAVENOUS | Status: AC
  Administered 2018-10-30: 820 mg via INTRAVENOUS
  Filled 2018-10-30: qty 41

## 2018-10-30 NOTE — Progress Notes (Signed)
Pt presents today for PICC placement and chemo treatment. Cycle 1, AC. VSS. Pt has no complaints of any pain. PICC assessment , C,D, I. R arm. No abnormal swelling or bleeding noted. Labs reviewed with Dr. Delton Coombes on 10/29/2018. Creatinine 9.95. MD aware. Proceed with treatment per Dr. Delton Coombes. Echo reviewed per Dr. Delton Coombes.   Pt requests to be scheduled twice per week for PICC flush. AAnderson RN arranged with scheduling.   Treatment given today per MD orders. Tolerated infusion without adverse affects. Vital signs stable. No complaints at this time. Neulasta On Pro placed without incident. Teaching performed. Understanding verbalizes. Discharged from clinic ambulatory. F/U with [facility] as scheduled.

## 2018-10-30 NOTE — Telephone Encounter (Signed)
Mickel Baas from [facility] called and wanted to know if the patient could continue to get the ESA medication that she is receiving during chemotherapy.  Spoke with Dr Raliegh Ip and he wants to hold the ESA during the next 12 weeks during treatment.  Notified [facility].

## 2018-10-30 NOTE — Patient Instructions (Signed)
Cancer Center Discharge Instructions for Patients Receiving Chemotherapy  Today you received the following chemotherapy agents   To help prevent nausea and vomiting after your treatment, we encourage you to take your nausea medication   If you develop nausea and vomiting that is not controlled by your nausea medication, call the clinic.   BELOW ARE SYMPTOMS THAT SHOULD BE REPORTED IMMEDIATELY:  *FEVER GREATER THAN 100.5 F  *CHILLS WITH OR WITHOUT FEVER  NAUSEA AND VOMITING THAT IS NOT CONTROLLED WITH YOUR NAUSEA MEDICATION  *UNUSUAL SHORTNESS OF BREATH  *UNUSUAL BRUISING OR BLEEDING  TENDERNESS IN MOUTH AND THROAT WITH OR WITHOUT PRESENCE OF ULCERS  *URINARY PROBLEMS  *BOWEL PROBLEMS  UNUSUAL RASH Items with * indicate a potential emergency and should be followed up as soon as possible.  Feel free to call the clinic should you have any questions or concerns. The clinic phone number is [phone].  Please show the CHEMO ALERT CARD at check-in to the Emergency Department and triage nurse.   

## 2018-10-31 ENCOUNTER — Inpatient Hospital Stay (HOSPITAL_COMMUNITY): Payer: Medicare Other

## 2018-10-31 ENCOUNTER — Telehealth (HOSPITAL_COMMUNITY): Payer: Self-pay

## 2018-10-31 DIAGNOSIS — E119 Type 2 diabetes mellitus without complications: Secondary | ICD-10-CM | POA: Diagnosis not present

## 2018-10-31 DIAGNOSIS — E1129 Type 2 diabetes mellitus with other diabetic kidney complication: Secondary | ICD-10-CM | POA: Diagnosis not present

## 2018-10-31 DIAGNOSIS — Z170 Estrogen receptor positive status [ER+]: Principal | ICD-10-CM

## 2018-10-31 DIAGNOSIS — D509 Iron deficiency anemia, unspecified: Secondary | ICD-10-CM | POA: Diagnosis not present

## 2018-10-31 DIAGNOSIS — C50211 Malignant neoplasm of upper-inner quadrant of right female breast: Secondary | ICD-10-CM

## 2018-10-31 DIAGNOSIS — N186 End stage renal disease: Secondary | ICD-10-CM | POA: Diagnosis not present

## 2018-10-31 DIAGNOSIS — D631 Anemia in chronic kidney disease: Secondary | ICD-10-CM | POA: Diagnosis not present

## 2018-10-31 DIAGNOSIS — N2581 Secondary hyperparathyroidism of renal origin: Secondary | ICD-10-CM | POA: Diagnosis not present

## 2018-10-31 NOTE — Telephone Encounter (Signed)
24 hour follow up.  Called mobile number and left message with patient to see how she was feeling and if she had any questions or concerns. Pt had dialysis today as well.

## 2018-10-31 NOTE — Telephone Encounter (Signed)
Pt called back to [facility]. Pt reports that she is feeling GREAT! Pt states, " I feel great and was napping when you called." Pt denies any nausea, vomiting or diarrhea. Appetite WNL. No concerns at this time per patient.

## 2018-11-02 DIAGNOSIS — D631 Anemia in chronic kidney disease: Secondary | ICD-10-CM | POA: Diagnosis not present

## 2018-11-02 DIAGNOSIS — E1129 Type 2 diabetes mellitus with other diabetic kidney complication: Secondary | ICD-10-CM | POA: Diagnosis not present

## 2018-11-02 DIAGNOSIS — D509 Iron deficiency anemia, unspecified: Secondary | ICD-10-CM | POA: Diagnosis not present

## 2018-11-02 DIAGNOSIS — N186 End stage renal disease: Secondary | ICD-10-CM | POA: Diagnosis not present

## 2018-11-02 DIAGNOSIS — E119 Type 2 diabetes mellitus without complications: Secondary | ICD-10-CM | POA: Diagnosis not present

## 2018-11-02 DIAGNOSIS — N2581 Secondary hyperparathyroidism of renal origin: Secondary | ICD-10-CM | POA: Diagnosis not present

## 2018-11-04 ENCOUNTER — Encounter (HOSPITAL_COMMUNITY): Payer: Self-pay

## 2018-11-04 ENCOUNTER — Other Ambulatory Visit: Payer: Self-pay

## 2018-11-04 ENCOUNTER — Inpatient Hospital Stay (HOSPITAL_COMMUNITY): Payer: Medicare Other

## 2018-11-04 VITALS — BP 152/79 | HR 78 | Temp 98.7°F | Resp 18

## 2018-11-04 DIAGNOSIS — G629 Polyneuropathy, unspecified: Secondary | ICD-10-CM | POA: Diagnosis not present

## 2018-11-04 DIAGNOSIS — C50211 Malignant neoplasm of upper-inner quadrant of right female breast: Secondary | ICD-10-CM | POA: Diagnosis not present

## 2018-11-04 DIAGNOSIS — I120 Hypertensive chronic kidney disease with stage 5 chronic kidney disease or end stage renal disease: Secondary | ICD-10-CM | POA: Diagnosis not present

## 2018-11-04 DIAGNOSIS — N186 End stage renal disease: Secondary | ICD-10-CM | POA: Diagnosis not present

## 2018-11-04 DIAGNOSIS — Z5111 Encounter for antineoplastic chemotherapy: Secondary | ICD-10-CM | POA: Diagnosis not present

## 2018-11-04 DIAGNOSIS — Z170 Estrogen receptor positive status [ER+]: Principal | ICD-10-CM

## 2018-11-04 MED ORDER — SODIUM CHLORIDE 0.9% FLUSH
10.0000 mL | Freq: Once | INTRAVENOUS | Status: AC
  Administered 2018-11-04: 10 mL via INTRAVENOUS

## 2018-11-04 MED ORDER — HEPARIN SOD (PORK) LOCK FLUSH 100 UNIT/ML IV SOLN
500.0000 [IU] | Freq: Once | INTRAVENOUS | Status: AC
  Administered 2018-11-04: 500 [IU] via INTRAVENOUS

## 2018-11-04 NOTE — Progress Notes (Signed)
Picc line flushed per policy without difficulty.  Dressing change with no complaints voiced.  Insertion site clean and dry with no redness, tenderness, or drainage noted.  Good blood return noted with flush.  VSs with discharge and left ambulatory with no s/s of distress noted.

## 2018-11-05 DIAGNOSIS — D509 Iron deficiency anemia, unspecified: Secondary | ICD-10-CM | POA: Diagnosis not present

## 2018-11-05 DIAGNOSIS — N186 End stage renal disease: Secondary | ICD-10-CM | POA: Diagnosis not present

## 2018-11-05 DIAGNOSIS — N2581 Secondary hyperparathyroidism of renal origin: Secondary | ICD-10-CM | POA: Diagnosis not present

## 2018-11-05 DIAGNOSIS — E119 Type 2 diabetes mellitus without complications: Secondary | ICD-10-CM | POA: Diagnosis not present

## 2018-11-05 DIAGNOSIS — E1129 Type 2 diabetes mellitus with other diabetic kidney complication: Secondary | ICD-10-CM | POA: Diagnosis not present

## 2018-11-05 DIAGNOSIS — D631 Anemia in chronic kidney disease: Secondary | ICD-10-CM | POA: Diagnosis not present

## 2018-11-07 ENCOUNTER — Other Ambulatory Visit: Payer: Self-pay

## 2018-11-07 ENCOUNTER — Encounter (HOSPITAL_COMMUNITY): Payer: Self-pay

## 2018-11-07 ENCOUNTER — Encounter (HOSPITAL_COMMUNITY): Payer: Medicare Other

## 2018-11-07 ENCOUNTER — Inpatient Hospital Stay (HOSPITAL_COMMUNITY): Payer: Medicare Other

## 2018-11-07 DIAGNOSIS — D509 Iron deficiency anemia, unspecified: Secondary | ICD-10-CM | POA: Diagnosis not present

## 2018-11-07 DIAGNOSIS — Z170 Estrogen receptor positive status [ER+]: Secondary | ICD-10-CM | POA: Diagnosis not present

## 2018-11-07 DIAGNOSIS — C50211 Malignant neoplasm of upper-inner quadrant of right female breast: Secondary | ICD-10-CM | POA: Diagnosis not present

## 2018-11-07 DIAGNOSIS — N186 End stage renal disease: Secondary | ICD-10-CM | POA: Diagnosis not present

## 2018-11-07 DIAGNOSIS — E1129 Type 2 diabetes mellitus with other diabetic kidney complication: Secondary | ICD-10-CM | POA: Diagnosis not present

## 2018-11-07 DIAGNOSIS — N2581 Secondary hyperparathyroidism of renal origin: Secondary | ICD-10-CM | POA: Diagnosis not present

## 2018-11-07 DIAGNOSIS — E119 Type 2 diabetes mellitus without complications: Secondary | ICD-10-CM | POA: Diagnosis not present

## 2018-11-07 DIAGNOSIS — D631 Anemia in chronic kidney disease: Secondary | ICD-10-CM | POA: Diagnosis not present

## 2018-11-07 DIAGNOSIS — G629 Polyneuropathy, unspecified: Secondary | ICD-10-CM | POA: Diagnosis not present

## 2018-11-07 DIAGNOSIS — I120 Hypertensive chronic kidney disease with stage 5 chronic kidney disease or end stage renal disease: Secondary | ICD-10-CM | POA: Diagnosis not present

## 2018-11-07 DIAGNOSIS — Z5111 Encounter for antineoplastic chemotherapy: Secondary | ICD-10-CM | POA: Diagnosis not present

## 2018-11-07 MED ORDER — HEPARIN SOD (PORK) LOCK FLUSH 100 UNIT/ML IV SOLN
50250.0000 [IU] | Freq: Once | INTRAVENOUS | Status: AC
  Administered 2018-11-07: 250 [IU] via INTRAVENOUS
  Filled 2018-11-07: qty 505

## 2018-11-07 MED ORDER — SODIUM CHLORIDE 0.9% FLUSH
10.0000 mL | INTRAVENOUS | Status: DC | PRN
  Administered 2018-11-07: 3 mL via INTRAVENOUS
  Filled 2018-11-07: qty 10

## 2018-11-07 NOTE — Progress Notes (Signed)
NORA SABEY tolerated PICC line flush well without complaints or incident. PICC line flushed with 3 ml NS and 2.5 ml Heparin easily per protocol. VSS Pt discharged self ambulatory in satisfactory condition

## 2018-11-07 NOTE — Patient Instructions (Signed)
[facility] at [facility] Discharge Instructions  PICC line flushed per protocol today. Follow-up as scheduled. Call clinic for any questions or concerns   Thank you for choosing [facility] at [facility] to provide your oncology and hematology care.  To afford each patient quality time with our provider, please arrive at least 15 minutes before your scheduled appointment time.   If you have a lab appointment with the Cancer Center please come in thru the  Main Entrance and check in at the main information desk  You need to re-schedule your appointment should you arrive 10 or more minutes late.  We strive to give you quality time with our providers, and arriving late affects you and other patients whose appointments are after yours.  Also, if you no show three or more times for appointments you may be dismissed from the clinic at the providers discretion.     Again, thank you for choosing [facility].  Our hope is that these requests will decrease the amount of time that you wait before being seen by our physicians.       _____________________________________________________________  Should you have questions after your visit to [facility], please contact our office at [phone] between the hours of 8:00 a.m. and 4:30 p.m.  Voicemails left after 4:00 p.m. will not be returned until the following business day.  For prescription refill requests, have your pharmacy contact our office and allow 72 hours.    Cancer Center Support Programs:   > Cancer Support Group  2nd Tuesday of the month 1pm-2pm, Journey Room   

## 2018-11-08 ENCOUNTER — Telehealth (HOSPITAL_COMMUNITY): Payer: Self-pay

## 2018-11-08 NOTE — Telephone Encounter (Signed)
Nutrition Assessment   Reason for Assessment:   Patient identified on Malnutrition Screening tool for weight loss and poor appetite   ASSESSMENT:   58 year old female with breast cancer. Patient s/p lumpectomy on 09/18/2018. Patient receiving adjuvant chemotherapy.  Past medical history of ESRD on HD,HTN.    Patient returned call that RD left earlier.  Introduce self and service.  Patient reports that appetite has been down for little bit.  "I just don't want much to eat."  Reports that she continues to try and eat good sources of protein (eggs, chicken, [place]).  Patient reports some times has issues with constipation.  Does not currently drink oral nutrition supplement.  Has tried protein bars but does not like them.    Patient reports that she speaks with RD at Dialysis.  Reports recently labs were good at dialysis  Nutrition Focused Physical Exam: deferred   Medications: reviewed   Labs: reviewed   Anthropometrics:   Height: 62 inches Weight: 165 lb on 3/18 Unsure dry weight.  Noted weight on 09/26/2018 173 lb. Noted most weight over last year 173-178 lb BMI: 30  5% weight loss in the last month, unsure if/how much fluid    NUTRITION DIAGNOSIS: Inadequate oral intake related to cancer related treatment side effects as evidenced by poor appetite.   INTERVENTION:  Discussed importance of good nutrition during treatment. Encouraged good sources of protein at every meal.  Patient verbalized understanding. May need to consider adding nepro shake if weight continues to go down.   Encouraged patient to stay on top of constipation. Contact information provided   MONITORING, EVALUATION, GOAL: Patient will consume adequate calories to maintain weight during treatment   Next Visit: as needed, patient to call RD with questions in the future  Ananya Mccleese B. Zenia Resides, [place], [place] Registered Dietitian [phone] (pager)

## 2018-11-08 NOTE — Telephone Encounter (Signed)
Nutrition  Patient identified on Malnutrition Screening tool for weight loss and poor appetite.  Called patient and left message to call RD back.  Aziyah Provencal B. Zenia Resides, [place], [place] Registered Dietitian [phone] (pager)

## 2018-11-09 DIAGNOSIS — E119 Type 2 diabetes mellitus without complications: Secondary | ICD-10-CM | POA: Diagnosis not present

## 2018-11-09 DIAGNOSIS — N186 End stage renal disease: Secondary | ICD-10-CM | POA: Diagnosis not present

## 2018-11-09 DIAGNOSIS — D509 Iron deficiency anemia, unspecified: Secondary | ICD-10-CM | POA: Diagnosis not present

## 2018-11-09 DIAGNOSIS — D631 Anemia in chronic kidney disease: Secondary | ICD-10-CM | POA: Diagnosis not present

## 2018-11-09 DIAGNOSIS — E1129 Type 2 diabetes mellitus with other diabetic kidney complication: Secondary | ICD-10-CM | POA: Diagnosis not present

## 2018-11-09 DIAGNOSIS — N2581 Secondary hyperparathyroidism of renal origin: Secondary | ICD-10-CM | POA: Diagnosis not present

## 2018-11-11 ENCOUNTER — Other Ambulatory Visit: Payer: Self-pay

## 2018-11-11 ENCOUNTER — Inpatient Hospital Stay (HOSPITAL_COMMUNITY): Payer: Medicare Other

## 2018-11-11 DIAGNOSIS — Z5111 Encounter for antineoplastic chemotherapy: Secondary | ICD-10-CM | POA: Diagnosis not present

## 2018-11-11 DIAGNOSIS — G629 Polyneuropathy, unspecified: Secondary | ICD-10-CM | POA: Diagnosis not present

## 2018-11-11 DIAGNOSIS — C50211 Malignant neoplasm of upper-inner quadrant of right female breast: Secondary | ICD-10-CM | POA: Diagnosis not present

## 2018-11-11 DIAGNOSIS — I120 Hypertensive chronic kidney disease with stage 5 chronic kidney disease or end stage renal disease: Secondary | ICD-10-CM | POA: Diagnosis not present

## 2018-11-11 DIAGNOSIS — Z170 Estrogen receptor positive status [ER+]: Secondary | ICD-10-CM | POA: Diagnosis not present

## 2018-11-11 DIAGNOSIS — N186 End stage renal disease: Secondary | ICD-10-CM | POA: Diagnosis not present

## 2018-11-11 MED ORDER — HEPARIN SOD (PORK) LOCK FLUSH 100 UNIT/ML IV SOLN
250.0000 [IU] | Freq: Once | INTRAVENOUS | Status: AC
  Administered 2018-11-11: 250 [IU] via INTRAVENOUS

## 2018-11-11 MED ORDER — SODIUM CHLORIDE 0.9% FLUSH
10.0000 mL | Freq: Once | INTRAVENOUS | Status: AC
  Administered 2018-11-11: 10 mL via INTRAVENOUS

## 2018-11-11 NOTE — Patient Instructions (Signed)
[facility] at [facility] _______________________________________________________________  Thank you for choosing [facility] at [facility] to provide your oncology and hematology care.  To afford each patient quality time with our providers, please arrive at least 15 minutes before your scheduled appointment.  You need to re-schedule your appointment if you arrive 10 or more minutes late.  We strive to give you quality time with our providers, and arriving late affects you and other patients whose appointments are after yours.  Also, if you no show three or more times for appointments you may be dismissed from the clinic.  Again, thank you for choosing [facility] at [facility]. Our hope is that these requests will allow you access to exceptional care and in a timely manner. _______________________________________________________________  If you have questions after your visit, please contact our office at [phone] between the hours of 8:30 a.m. and 5:00 p.m. Voicemails left after 4:30 p.m. will not be returned until the following business day. _______________________________________________________________  For prescription refill requests, have your pharmacy contact our office. _______________________________________________________________  Recommendations made by the consultant and any test results will be sent to your referring physician. _______________________________________________________________ 

## 2018-11-12 DIAGNOSIS — N2581 Secondary hyperparathyroidism of renal origin: Secondary | ICD-10-CM | POA: Diagnosis not present

## 2018-11-12 DIAGNOSIS — D509 Iron deficiency anemia, unspecified: Secondary | ICD-10-CM | POA: Diagnosis not present

## 2018-11-12 DIAGNOSIS — E119 Type 2 diabetes mellitus without complications: Secondary | ICD-10-CM | POA: Diagnosis not present

## 2018-11-12 DIAGNOSIS — E1129 Type 2 diabetes mellitus with other diabetic kidney complication: Secondary | ICD-10-CM | POA: Diagnosis not present

## 2018-11-12 DIAGNOSIS — N186 End stage renal disease: Secondary | ICD-10-CM | POA: Diagnosis not present

## 2018-11-12 DIAGNOSIS — D631 Anemia in chronic kidney disease: Secondary | ICD-10-CM | POA: Diagnosis not present

## 2018-11-13 DIAGNOSIS — Z992 Dependence on renal dialysis: Secondary | ICD-10-CM | POA: Diagnosis not present

## 2018-11-13 DIAGNOSIS — N040 Nephrotic syndrome with minor glomerular abnormality: Secondary | ICD-10-CM | POA: Diagnosis not present

## 2018-11-13 DIAGNOSIS — N186 End stage renal disease: Secondary | ICD-10-CM | POA: Diagnosis not present

## 2018-11-14 ENCOUNTER — Other Ambulatory Visit: Payer: Self-pay

## 2018-11-14 DIAGNOSIS — E119 Type 2 diabetes mellitus without complications: Secondary | ICD-10-CM | POA: Diagnosis not present

## 2018-11-14 DIAGNOSIS — N2581 Secondary hyperparathyroidism of renal origin: Secondary | ICD-10-CM | POA: Diagnosis not present

## 2018-11-14 DIAGNOSIS — E876 Hypokalemia: Secondary | ICD-10-CM | POA: Diagnosis not present

## 2018-11-14 DIAGNOSIS — N186 End stage renal disease: Secondary | ICD-10-CM | POA: Diagnosis not present

## 2018-11-14 DIAGNOSIS — E1129 Type 2 diabetes mellitus with other diabetic kidney complication: Secondary | ICD-10-CM | POA: Diagnosis not present

## 2018-11-15 ENCOUNTER — Inpatient Hospital Stay (HOSPITAL_COMMUNITY): Payer: Medicare Other | Attending: Hematology

## 2018-11-15 DIAGNOSIS — E119 Type 2 diabetes mellitus without complications: Secondary | ICD-10-CM | POA: Diagnosis not present

## 2018-11-15 DIAGNOSIS — Z5111 Encounter for antineoplastic chemotherapy: Secondary | ICD-10-CM | POA: Insufficient documentation

## 2018-11-15 DIAGNOSIS — Z992 Dependence on renal dialysis: Secondary | ICD-10-CM | POA: Insufficient documentation

## 2018-11-15 DIAGNOSIS — E876 Hypokalemia: Secondary | ICD-10-CM | POA: Diagnosis not present

## 2018-11-15 DIAGNOSIS — Z170 Estrogen receptor positive status [ER+]: Secondary | ICD-10-CM | POA: Diagnosis not present

## 2018-11-15 DIAGNOSIS — Z7689 Persons encountering health services in other specified circumstances: Secondary | ICD-10-CM | POA: Diagnosis not present

## 2018-11-15 DIAGNOSIS — N2581 Secondary hyperparathyroidism of renal origin: Secondary | ICD-10-CM | POA: Diagnosis not present

## 2018-11-15 DIAGNOSIS — C50211 Malignant neoplasm of upper-inner quadrant of right female breast: Secondary | ICD-10-CM | POA: Diagnosis not present

## 2018-11-15 DIAGNOSIS — G629 Polyneuropathy, unspecified: Secondary | ICD-10-CM | POA: Insufficient documentation

## 2018-11-15 DIAGNOSIS — I129 Hypertensive chronic kidney disease with stage 1 through stage 4 chronic kidney disease, or unspecified chronic kidney disease: Secondary | ICD-10-CM | POA: Diagnosis not present

## 2018-11-15 DIAGNOSIS — E1129 Type 2 diabetes mellitus with other diabetic kidney complication: Secondary | ICD-10-CM | POA: Diagnosis not present

## 2018-11-15 DIAGNOSIS — N186 End stage renal disease: Secondary | ICD-10-CM | POA: Insufficient documentation

## 2018-11-15 MED ORDER — SODIUM CHLORIDE 0.9% FLUSH
10.0000 mL | Freq: Once | INTRAVENOUS | Status: AC
  Administered 2018-11-15: 10 mL via INTRAVENOUS

## 2018-11-15 MED ORDER — HEPARIN SOD (PORK) LOCK FLUSH 100 UNIT/ML IV SOLN
500.0000 [IU] | Freq: Once | INTRAVENOUS | Status: AC
  Administered 2018-11-15: 500 [IU] via INTRAVENOUS

## 2018-11-15 NOTE — Patient Instructions (Signed)
[facility] at [facility]  Discharge Instructions:   _______________________________________________________________  Thank you for choosing [facility] at [facility] to provide your oncology and hematology care.  To afford each patient quality time with our providers, please arrive at least 15 minutes before your scheduled appointment.  You need to re-schedule your appointment if you arrive 10 or more minutes late.  We strive to give you quality time with our providers, and arriving late affects you and other patients whose appointments are after yours.  Also, if you no show three or more times for appointments you may be dismissed from the clinic.  Again, thank you for choosing [facility]. Our hope is that these requests will allow you access to exceptional care and in a timely manner. _______________________________________________________________  If you have questions after your visit, please contact our office at [phone] between the hours of 8:30 a.m. and 5:00 p.m. Voicemails left after 4:30 p.m. will not be returned until the following business day. _______________________________________________________________  For prescription refill requests, have your pharmacy contact our office. _______________________________________________________________  Recommendations made by the consultant and any test results will be sent to your referring physician. _______________________________________________________________ 

## 2018-11-18 ENCOUNTER — Inpatient Hospital Stay (HOSPITAL_COMMUNITY): Payer: Medicare Other

## 2018-11-18 ENCOUNTER — Other Ambulatory Visit: Payer: Self-pay

## 2018-11-18 DIAGNOSIS — I129 Hypertensive chronic kidney disease with stage 1 through stage 4 chronic kidney disease, or unspecified chronic kidney disease: Secondary | ICD-10-CM | POA: Diagnosis not present

## 2018-11-18 DIAGNOSIS — Z7689 Persons encountering health services in other specified circumstances: Secondary | ICD-10-CM | POA: Diagnosis not present

## 2018-11-18 DIAGNOSIS — N186 End stage renal disease: Secondary | ICD-10-CM | POA: Diagnosis not present

## 2018-11-18 DIAGNOSIS — Z5111 Encounter for antineoplastic chemotherapy: Secondary | ICD-10-CM | POA: Diagnosis not present

## 2018-11-18 DIAGNOSIS — Z170 Estrogen receptor positive status [ER+]: Secondary | ICD-10-CM | POA: Diagnosis not present

## 2018-11-18 DIAGNOSIS — C50211 Malignant neoplasm of upper-inner quadrant of right female breast: Secondary | ICD-10-CM | POA: Diagnosis not present

## 2018-11-18 MED ORDER — HEPARIN SOD (PORK) LOCK FLUSH 100 UNIT/ML IV SOLN
250.0000 [IU] | Freq: Once | INTRAVENOUS | Status: AC
  Administered 2018-11-18: 250 [IU] via INTRAVENOUS

## 2018-11-18 MED ORDER — SODIUM CHLORIDE 0.9% FLUSH
10.0000 mL | INTRAVENOUS | Status: DC | PRN
  Administered 2018-11-18: 10 mL via INTRAVENOUS
  Filled 2018-11-18: qty 10

## 2018-11-19 ENCOUNTER — Other Ambulatory Visit: Payer: Self-pay

## 2018-11-19 ENCOUNTER — Encounter (HOSPITAL_COMMUNITY): Payer: Self-pay

## 2018-11-19 DIAGNOSIS — N186 End stage renal disease: Secondary | ICD-10-CM | POA: Diagnosis not present

## 2018-11-19 DIAGNOSIS — E1129 Type 2 diabetes mellitus with other diabetic kidney complication: Secondary | ICD-10-CM | POA: Diagnosis not present

## 2018-11-19 DIAGNOSIS — E119 Type 2 diabetes mellitus without complications: Secondary | ICD-10-CM | POA: Diagnosis not present

## 2018-11-19 DIAGNOSIS — E876 Hypokalemia: Secondary | ICD-10-CM | POA: Diagnosis not present

## 2018-11-19 DIAGNOSIS — N2581 Secondary hyperparathyroidism of renal origin: Secondary | ICD-10-CM | POA: Diagnosis not present

## 2018-11-20 ENCOUNTER — Inpatient Hospital Stay (HOSPITAL_COMMUNITY): Payer: Medicare Other

## 2018-11-20 ENCOUNTER — Inpatient Hospital Stay (HOSPITAL_BASED_OUTPATIENT_CLINIC_OR_DEPARTMENT_OTHER): Payer: Medicare Other | Admitting: Hematology

## 2018-11-20 ENCOUNTER — Other Ambulatory Visit: Payer: Self-pay

## 2018-11-20 ENCOUNTER — Other Ambulatory Visit (HOSPITAL_COMMUNITY): Payer: Self-pay | Admitting: *Deleted

## 2018-11-20 ENCOUNTER — Encounter (HOSPITAL_COMMUNITY): Payer: Self-pay | Admitting: Hematology

## 2018-11-20 VITALS — BP 167/92 | HR 73 | Temp 97.7°F | Resp 18

## 2018-11-20 DIAGNOSIS — N186 End stage renal disease: Secondary | ICD-10-CM | POA: Diagnosis not present

## 2018-11-20 DIAGNOSIS — Z170 Estrogen receptor positive status [ER+]: Principal | ICD-10-CM

## 2018-11-20 DIAGNOSIS — Z923 Personal history of irradiation: Secondary | ICD-10-CM

## 2018-11-20 DIAGNOSIS — C50211 Malignant neoplasm of upper-inner quadrant of right female breast: Secondary | ICD-10-CM

## 2018-11-20 DIAGNOSIS — E785 Hyperlipidemia, unspecified: Secondary | ICD-10-CM | POA: Diagnosis not present

## 2018-11-20 DIAGNOSIS — Z9221 Personal history of antineoplastic chemotherapy: Secondary | ICD-10-CM

## 2018-11-20 DIAGNOSIS — R51 Headache: Secondary | ICD-10-CM

## 2018-11-20 DIAGNOSIS — Z5111 Encounter for antineoplastic chemotherapy: Secondary | ICD-10-CM | POA: Diagnosis not present

## 2018-11-20 DIAGNOSIS — F1010 Alcohol abuse, uncomplicated: Secondary | ICD-10-CM

## 2018-11-20 DIAGNOSIS — R011 Cardiac murmur, unspecified: Secondary | ICD-10-CM

## 2018-11-20 DIAGNOSIS — G629 Polyneuropathy, unspecified: Secondary | ICD-10-CM

## 2018-11-20 DIAGNOSIS — Z171 Estrogen receptor negative status [ER-]: Secondary | ICD-10-CM | POA: Diagnosis not present

## 2018-11-20 DIAGNOSIS — D696 Thrombocytopenia, unspecified: Secondary | ICD-10-CM

## 2018-11-20 DIAGNOSIS — K219 Gastro-esophageal reflux disease without esophagitis: Secondary | ICD-10-CM

## 2018-11-20 DIAGNOSIS — D693 Immune thrombocytopenic purpura: Secondary | ICD-10-CM

## 2018-11-20 DIAGNOSIS — I10 Essential (primary) hypertension: Secondary | ICD-10-CM | POA: Diagnosis not present

## 2018-11-20 DIAGNOSIS — I129 Hypertensive chronic kidney disease with stage 1 through stage 4 chronic kidney disease, or unspecified chronic kidney disease: Secondary | ICD-10-CM | POA: Diagnosis not present

## 2018-11-20 DIAGNOSIS — Z87442 Personal history of urinary calculi: Secondary | ICD-10-CM

## 2018-11-20 DIAGNOSIS — Z7689 Persons encountering health services in other specified circumstances: Secondary | ICD-10-CM | POA: Diagnosis not present

## 2018-11-20 DIAGNOSIS — F329 Major depressive disorder, single episode, unspecified: Secondary | ICD-10-CM

## 2018-11-20 DIAGNOSIS — Z79899 Other long term (current) drug therapy: Secondary | ICD-10-CM

## 2018-11-20 DIAGNOSIS — F17210 Nicotine dependence, cigarettes, uncomplicated: Secondary | ICD-10-CM

## 2018-11-20 DIAGNOSIS — R002 Palpitations: Secondary | ICD-10-CM

## 2018-11-20 DIAGNOSIS — E876 Hypokalemia: Secondary | ICD-10-CM

## 2018-11-20 LAB — CBC WITH DIFFERENTIAL/PLATELET
Abs Immature Granulocytes: 0.12 10*3/uL — ABNORMAL HIGH (ref 0.00–0.07)
Basophils Absolute: 0.2 10*3/uL — ABNORMAL HIGH (ref 0.0–0.1)
Basophils Relative: 2 %
Eosinophils Absolute: 0.1 10*3/uL (ref 0.0–0.5)
Eosinophils Relative: 1 %
HCT: 29.5 % — ABNORMAL LOW (ref 36.0–46.0)
Hemoglobin: 9.2 g/dL — ABNORMAL LOW (ref 12.0–15.0)
Immature Granulocytes: 2 %
Lymphocytes Relative: 17 %
Lymphs Abs: 1.4 10*3/uL (ref 0.7–4.0)
MCH: 31.7 pg (ref 26.0–34.0)
MCHC: 31.2 g/dL (ref 30.0–36.0)
MCV: 101.7 fL — ABNORMAL HIGH (ref 80.0–100.0)
Monocytes Absolute: 0.6 10*3/uL (ref 0.1–1.0)
Monocytes Relative: 8 %
Neutro Abs: 5.4 10*3/uL (ref 1.7–7.7)
Neutrophils Relative %: 70 %
Platelets: 363 10*3/uL (ref 150–400)
RBC: 2.9 MIL/uL — ABNORMAL LOW (ref 3.87–5.11)
RDW: 15.1 % (ref 11.5–15.5)
WBC: 7.8 10*3/uL (ref 4.0–10.5)
nRBC: 0 % (ref 0.0–0.2)

## 2018-11-20 LAB — COMPREHENSIVE METABOLIC PANEL
ALT: 16 U/L (ref 0–44)
AST: 16 U/L (ref 15–41)
Albumin: 3.3 g/dL — ABNORMAL LOW (ref 3.5–5.0)
Alkaline Phosphatase: 87 U/L (ref 38–126)
Anion gap: 13 (ref 5–15)
BUN: 53 mg/dL — ABNORMAL HIGH (ref 6–20)
CO2: 27 mmol/L (ref 22–32)
Calcium: 8.7 mg/dL — ABNORMAL LOW (ref 8.9–10.3)
Chloride: 98 mmol/L (ref 98–111)
Creatinine, Ser: 8.2 mg/dL — ABNORMAL HIGH (ref 0.44–1.00)
GFR calc Af Amer: 6 mL/min — ABNORMAL LOW (ref >60)
GFR calc non Af Amer: 5 mL/min — ABNORMAL LOW (ref >60)
Glucose, Bld: 182 mg/dL — ABNORMAL HIGH (ref 70–99)
Potassium: 2.9 mmol/L — ABNORMAL LOW (ref 3.5–5.1)
Sodium: 138 mmol/L (ref 135–145)
Total Bilirubin: 0.5 mg/dL (ref 0.3–1.2)
Total Protein: 6.5 g/dL (ref 6.5–8.1)

## 2018-11-20 MED ORDER — DOXORUBICIN HCL CHEMO IV INJECTION 2 MG/ML
60.0000 mg/m2 | Freq: Once | INTRAVENOUS | Status: AC
  Administered 2018-11-20: 108 mg via INTRAVENOUS
  Filled 2018-11-20: qty 54

## 2018-11-20 MED ORDER — SODIUM CHLORIDE 0.9 % IV SOLN
Freq: Once | INTRAVENOUS | Status: AC
  Administered 2018-11-20: 12:00:00 via INTRAVENOUS
  Filled 2018-11-20: qty 5

## 2018-11-20 MED ORDER — SODIUM CHLORIDE 0.9 % IV SOLN
450.0000 mg/m2 | Freq: Once | INTRAVENOUS | Status: AC
  Administered 2018-11-20: 820 mg via INTRAVENOUS
  Filled 2018-11-20: qty 41

## 2018-11-20 MED ORDER — PEGFILGRASTIM 6 MG/0.6ML ~~LOC~~ PSKT
6.0000 mg | PREFILLED_SYRINGE | Freq: Once | SUBCUTANEOUS | Status: AC
  Administered 2018-11-20: 6 mg via SUBCUTANEOUS
  Filled 2018-11-20: qty 0.6

## 2018-11-20 MED ORDER — POTASSIUM CHLORIDE CRYS ER 20 MEQ PO TBCR: 20.0000 meq | EXTENDED_RELEASE_TABLET | Freq: Once | ORAL | Status: DC

## 2018-11-20 MED ORDER — POTASSIUM CHLORIDE CRYS ER 20 MEQ PO TBCR
20.0000 meq | EXTENDED_RELEASE_TABLET | Freq: Once | ORAL | Status: AC
  Administered 2018-11-20: 20 meq via ORAL
  Filled 2018-11-20: qty 1

## 2018-11-20 MED ORDER — SODIUM CHLORIDE 0.9 % IV SOLN
Freq: Once | INTRAVENOUS | Status: AC
  Administered 2018-11-20: 12:00:00 via INTRAVENOUS

## 2018-11-20 MED ORDER — SODIUM CHLORIDE 0.9% FLUSH
10.0000 mL | INTRAVENOUS | Status: DC | PRN
  Administered 2018-11-20: 10 mL
  Filled 2018-11-20: qty 10

## 2018-11-20 MED ORDER — PALONOSETRON HCL INJECTION 0.25 MG/5ML
0.2500 mg | Freq: Once | INTRAVENOUS | Status: AC
  Administered 2018-11-20: 0.25 mg via INTRAVENOUS
  Filled 2018-11-20: qty 5

## 2018-11-20 MED ORDER — HEPARIN SOD (PORK) LOCK FLUSH 100 UNIT/ML IV SOLN
500.0000 [IU] | Freq: Once | INTRAVENOUS | Status: AC | PRN
  Administered 2018-11-20: 500 [IU]

## 2018-11-20 NOTE — Patient Instructions (Signed)
[facility] Discharge Instructions for Patients Receiving Chemotherapy  Today you received the following chemotherapy agents   To help prevent nausea and vomiting after your treatment, we encourage you to take your nausea medication   If you develop nausea and vomiting that is not controlled by your nausea medication, call the clinic.   BELOW ARE SYMPTOMS THAT SHOULD BE REPORTED IMMEDIATELY:  *FEVER GREATER THAN 100.5 F  *CHILLS WITH OR WITHOUT FEVER  NAUSEA AND VOMITING THAT IS NOT CONTROLLED WITH YOUR NAUSEA MEDICATION  *UNUSUAL SHORTNESS OF BREATH  *UNUSUAL BRUISING OR BLEEDING  TENDERNESS IN MOUTH AND THROAT WITH OR WITHOUT PRESENCE OF ULCERS  *URINARY PROBLEMS  *BOWEL PROBLEMS  UNUSUAL RASH Items with * indicate a potential emergency and should be followed up as soon as possible.  Feel free to call the clinic should you have any questions or concerns. The clinic phone number is [phone].  Please show the CHEMO ALERT CARD at check-in to the Emergency Department and triage nurse.   

## 2018-11-20 NOTE — Progress Notes (Signed)
Labs reviewed at office visit today. Proceed with treatment. Potassium noted by MD.   .Megan [name] arrived today for [facility] neulasta on body injector. See MAR for administration details. Injector in place and engaged with green light indicator on flashing. Tolerated application with out problems.  Treatment given per orders. Patient tolerated it well without problems. Vitals stable and discharged home from clinic ambulatory. Follow up as scheduled.

## 2018-11-20 NOTE — Progress Notes (Signed)
Megan [name], [place] [address]   CLINIC:  Medical Oncology/Hematology  PCP:  [name], MD [address] [place] [address] [phone]   REASON FOR VISIT:  Follow-up for breast cancer  CURRENT THERAPY: doxorubicin and Cyclophosphamide every 21 days  BRIEF ONCOLOGIC HISTORY:    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[place])   09/18/2018 Initial Diagnosis    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[place])    09/26/2018 Cancer Staging    Staging form: Breast, AJCC 8th Edition - Clinical stage from 09/26/2018: Stage IB (cT1c, cN0, cM0, G3, ER+, PR-, HER2-) - Signed by Derek Jack, MD on 09/26/2018    10/30/2018 -  Chemotherapy    The patient had DOXOrubicin (ADRIAMYCIN) chemo injection 108 mg, 60 mg/m2 = 108 mg, Intravenous,  Once, 2 of 4 cycles Administration: 108 mg (10/30/2018) palonosetron (ALOXI) injection 0.25 mg, 0.25 mg, Intravenous,  Once, 2 of 4 cycles Administration: 0.25 mg (10/30/2018), 0.25 mg (11/20/2018) pegfilgrastim (NEULASTA ONPRO KIT) injection 6 mg, 6 mg, Subcutaneous, Once, 2 of 4 cycles Administration: 6 mg (10/30/2018) cyclophosphamide (CYTOXAN) 820 mg in sodium chloride 0.9 % 250 mL chemo infusion, 450 mg/m2 = 820 mg (75 % of original dose 600 mg/m2), Intravenous,  Once, 2 of 4 cycles Dose modification: 450 mg/m2 (75 % of original dose 600 mg/m2, Cycle 1, Reason: Other (see comments), Comment: hemodialysis) Administration: 820 mg (10/30/2018) fosaprepitant (EMEND) 150 mg, dexamethasone (DECADRON) 12 mg in sodium chloride 0.9 % 145 mL IVPB, , Intravenous,  Once, 2 of 4 cycles Administration:  (10/30/2018),  (11/20/2018)  for chemotherapy treatment.       CANCER STAGING: Cancer Staging Malignant neoplasm of upper-inner quadrant of right female breast [facility]) Staging form: Breast, AJCC 8th Edition - Clinical stage from 09/26/2018: Stage IB (cT1c, cN0,  cM0, G3, ER+, PR-, HER2-) - Signed by Derek Jack, MD on 09/26/2018    INTERVAL HISTORY:  Megan [name] 58 y.o. female returns for routine follow-up and consideration for next cycle of chemotherapy. She states that she has been doing very well. She denies having to use any medications for nausea, vomiting or diarrhea.  She denies any cough or recent fevers.  Her hair has started coming out.  Her nails have started turning dark.  Denies any new pains. Had not noticed any recent bleeding such as epistaxis, hematuria or hematochezia. Denies recent chest pain on exertion, shortness of breath on minimal exertion, pre-syncopal episodes, or palpitations. Denies any recent fevers, infections, or recent hospitalizations. Overall, she tells me she has been feeling pretty well. Energy levels 50%; appetite .   Overall, she feels ready for next cycle of chemo today.     REVIEW OF SYSTEMS:  Review of Systems  All other systems reviewed and are negative.    PAST MEDICAL/SURGICAL HISTORY:  Past Medical History:  Diagnosis Date   Anemia    Blood transfusion without reported diagnosis    Cataract    CKD (chronic kidney disease) stage 3, GFR 30-59 ml/min (HCC)    Essential hypertension, benign    GERD (gastroesophageal reflux disease)    Headache    Hemorrhoids    Mixed hyperlipidemia    PONV (postoperative nausea and vomiting)    Renal insufficiency    S/P colonoscopy Jan 2011   Dr. Benson Norway: sessile polyp (benign lymphoid), large hemorrhoids, repeat 5-10 years   Temporal arteritis ([place])    Type 2  diabetes mellitus ([place])    Wears glasses    Past Surgical History:  Procedure Laterality Date   ABDOMINAL HYSTERECTOMY     APPENDECTOMY     ARTERY BIOPSY N/A 05/09/2018   Procedure: RIGHT TEMPORAL ARTERY BIOPSY;  Surgeon: Judeth Horn, MD;  Location: [place];  Service: General;  Laterality: N/A;   CATARACT EXTRACTION W/PHACO Left 02/09/2017   Procedure: CATARACT EXTRACTION  PHACO AND INTRAOCULAR LENS PLACEMENT LEFT EYE;  Surgeon: Tonny Branch, MD;  Location: [facility];  Service: Ophthalmology;  Laterality: Left;  CDE: 4.89   CATARACT EXTRACTION W/PHACO Right 06/04/2017   Procedure: CATARACT EXTRACTION PHACO AND INTRAOCULAR LENS PLACEMENT (IOC);  Surgeon: Tonny Branch, MD;  Location: [facility];  Service: Ophthalmology;  Laterality: Right;  CDE: 4.12   CHOLECYSTECTOMY  09/29/2011   Procedure: LAPAROSCOPIC CHOLECYSTECTOMY;  Surgeon: Jamesetta So, MD;  Location: [facility];  Service: General;  Laterality: N/A;   COLONOSCOPY  Jan 2011   Dr. Benson Norway: sessile polyp (benign lymphoid), large hemorrhoids, repeat 5-10 years   COLONOSCOPY N/A 06/12/2016   Procedure: COLONOSCOPY;  Surgeon: Daneil Dolin, MD;  [facility];  Service: Endoscopy;  Laterality: N/A;  1230    ESOPHAGOGASTRODUODENOSCOPY  09/05/2011   [id number] hiatal hernia; remainder of exam normal. No explanation for patient's abdominal pain with today's examination   ESOPHAGOGASTRODUODENOSCOPY N/A 12/17/2013   Dr. [name]: gastric erythema, erosion, mild chronic inflammation on path    LAPAROSCOPIC APPENDECTOMY  09/29/2011   Procedure: APPENDECTOMY LAPAROSCOPIC;  Surgeon: Jamesetta So, MD;  Location: [facility];  Service: General;;  incidental appendectomy   PARTIAL MASTECTOMY WITH NEEDLE LOCALIZATION AND AXILLARY SENTINEL LYMPH NODE BX Right 09/18/2018   Procedure: RIGHT PARTIAL MASTECTOMY AFTER NEEDLE LOCALIZATION, SENTINEL LYMPH NODE BIOPSY RIGHT AXILLA;  Surgeon: Aviva Signs, MD;  Location: [facility];  Service: General;  Laterality: Right;     SOCIAL HISTORY:  Social History   Socioeconomic History   Marital status: Married    Spouse name: Not on file   Number of children: Not on file   Years of education: Not on file   Highest education level: Not on file  Occupational History   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[employer] [place] resource strain: Not hard at all   Food  insecurity:    Worry: Never true    Inability: Never true   Transportation needs:    Medical: No    Non-medical: No  Tobacco Use   Smoking status: Never Smoker   Smokeless tobacco: Never Used  Substance and Sexual Activity   Alcohol use: No   Drug use: No   Sexual activity: Yes    Birth control/protection: Surgical  Lifestyle   Physical activity:    Days per week: 0 days    Minutes per session: 0 min   Stress: Not at all  Relationships   Social connections:    Talks on phone: Twice a week    Gets together: Twice a week    Attends religious service: More than 4 times per year    Active member of club or organization: No    Attends meetings of clubs or organizations: Never    Relationship status: Married   Intimate partner violence:    Fear of current or ex partner: No    Emotionally abused: No    Physically abused: No    Forced sexual activity: No  Other Topics Concern   Not on file  Social History Narrative   Works at [employer] in [place].    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   Also has to get items from high shelves-causes achy pain in shoulder area      Married.   Children are grown, out of house.    FAMILY HISTORY:  Family History  Problem Relation Age of Onset   Hypertension Mother    Coronary artery disease Mother    Diabetes Mother    Hypertension Sister    Coronary artery disease Sister    Hypertension Brother    Heart attack Father    Hypertension Son    Heart attack Maternal Aunt    Hypertension Maternal Aunt    Diabetes Maternal Aunt    Heart attack Maternal Uncle    Hypertension Maternal Uncle    Diabetes Maternal Uncle    Heart attack Paternal Aunt    Hypertension Paternal Aunt    Diabetes Paternal Aunt    Heart attack Paternal Uncle    Hypertension Paternal Uncle    Diabetes Paternal Uncle    Heart attack Maternal Grandmother    Heart attack Maternal Grandfather    Heart attack  Paternal Grandmother    Heart attack Paternal Grandfather    Colon cancer Neg Hx     CURRENT MEDICATIONS:  Outpatient Encounter Medications as of 11/20/2018  Medication Sig   amLODipine (NORVASC) 5 MG tablet Take 1 tablet (5 mg total) by mouth daily. (Patient taking differently: Take 5 mg by mouth daily as needed (blood pressure 159 or above). )   aspirin EC 81 MG tablet Take 81 mg by mouth daily.   atorvastatin (LIPITOR) 20 MG tablet Take 1 tablet (20 mg total) by mouth daily. (Patient taking differently: Take 20 mg by mouth at bedtime. )   B Complex-C-Folic Acid (DIALYVITE 629) 0.8 MG TABS Take 800 mg by mouth daily.    butalbital-acetaminophen-caffeine (FIORICET, ESGIC) 50-325-40 MG tablet Take 1 tablet by mouth every 6 (six) hours as needed for headache.   cinacalcet (SENSIPAR) 30 MG tablet Take 2 tablets (60 mg total) by mouth daily. (Patient taking differently: Take 30 mg by mouth daily. )   DULoxetine (CYMBALTA) 30 MG capsule Take 1 capsule (30 mg total) by mouth daily.   gabapentin (NEURONTIN) 300 MG capsule TAKE 1 CAPSULE(300 MG) BY MOUTH AT BEDTIME   HYDROcodone-acetaminophen (NORCO) 5-325 MG tablet Take 1 tablet by mouth every 4 (four) hours as needed for moderate pain.   lanthanum (FOSRENOL) 1000 MG chewable tablet Chew 1,000-2,000 mg by mouth See admin instructions. Take 2000 mg by mouth twice daily with meals and 1000 with a snack   lidocaine-prilocaine (EMLA) cream APPLY SMALL AMOUNT TO ACCESS SITE (AVF) 1 TO 2 HOURS BEFORE DIALYSIS. COVER WITH OCCLUSIVE DRESSING (SARAN WRAP)   losartan (COZAAR) 50 MG tablet Take 1 tablet (50 mg total) by mouth daily. (Patient taking differently: Take 50 mg by mouth at bedtime. )   nebivolol (BYSTOLIC) 10 MG tablet Take 1 tablet (10 mg total) by mouth 2 (two) times daily.   ondansetron (ZOFRAN) 4 MG tablet Take 1 tablet (4 mg total) by mouth every 8 (eight) hours as needed for nausea or vomiting.   pantoprazole (PROTONIX) 40 MG  tablet TAKE 1 TABLET(40 MG) BY MOUTH DAILY (Patient taking differently: Take 40 mg by mouth daily before breakfast. )   prochlorperazine (COMPAZINE) 10 MG tablet Take 1 tablet (10 mg total) by mouth every 6 (six) hours  as needed (Nausea or vomiting).   tiZANidine (ZANAFLEX) 4 MG capsule TAKE 1 CAPSULE(4 MG) BY MOUTH TWICE DAILY WITH A MEAL. START 1 CAPSULE AT NIGHT FOR 2 WEEKS AND THEN TWICE DAILY   [DISCONTINUED] B Complex-C-Folic Acid (DIALYVITE 893) 0.8 MG TABS Take by mouth.   [DISCONTINUED] cloNIDine (CATAPRES) 0.1 MG tablet Take 0.1 mg by mouth 2 (two) times daily.   Facility-Administered Encounter Medications as of 11/20/2018  Medication Note   [DISCONTINUED] potassium chloride SA (K-DUR,KLOR-CON) CR tablet 20 mEq 11/20/2018: change encounter to infusion room    ALLERGIES:  No Known Allergies   PHYSICAL EXAM:  ECOG Performance status: 1  Vitals:   11/20/18 0953  BP: (!) 143/95  Pulse: 76  Resp: 18  Temp: 97.8 F (36.6 C)   Filed Weights   11/20/18 0953  Weight: 167 lb 4.8 oz (75.9 kg)    Physical Exam Vitals signs reviewed.  Constitutional:      Appearance: Normal appearance.  Cardiovascular:     Rate and Rhythm: Normal rate and regular rhythm.     Heart sounds: Normal heart sounds.  Pulmonary:     Effort: Pulmonary effort is normal.     Breath sounds: Normal breath sounds.  Abdominal:     General: Bowel sounds are normal. There is no distension.     Palpations: Abdomen is soft. There is no mass.  Musculoskeletal:        General: No swelling.  Skin:    General: Skin is warm.  Neurological:     General: No focal deficit present.     Mental Status: She is alert and oriented to person, place, and time.  Psychiatric:        Mood and Affect: Mood normal.        Behavior: Behavior normal.      LABORATORY DATA:  I have reviewed the labs as listed.  CBC    Component Value Date/Time   WBC 7.8 11/20/2018 0907   RBC 2.90 (L) 11/20/2018 0907   HGB 9.2 (L)  11/20/2018 0907   HCT 29.5 (L) 11/20/2018 0907   PLT 363 11/20/2018 0907   MCV 101.7 (H) 11/20/2018 0907   MCH 31.7 11/20/2018 0907   MCHC 31.2 11/20/2018 0907   RDW 15.1 11/20/2018 0907   LYMPHSABS 1.4 11/20/2018 0907   MONOABS 0.6 11/20/2018 0907   EOSABS 0.1 11/20/2018 0907   BASOSABS 0.2 (H) 11/20/2018 0907   CMP Latest Ref Rng & Units 11/20/2018 10/28/2018 08/18/2018  Glucose 70 - 99 mg/dL 182(H) 105(H) 115(H)  BUN 6 - 20 mg/dL 53(H) 44(H) 47(H)  Creatinine 0.44 - 1.00 mg/dL 8.20(H) 9.95(H) 10.28(H)  Sodium 135 - 145 mmol/L 138 141 139  Potassium 3.5 - 5.1 mmol/L 2.9(L) 3.5 3.3(L)  Chloride 98 - 111 mmol/L 98 99 100  CO2 22 - 32 mmol/L 27 29 26   Calcium 8.9 - 10.3 mg/dL 8.7(L) 9.5 9.1  Total Protein 6.5 - 8.1 g/dL 6.5 7.0 -  Total Bilirubin 0.3 - 1.2 mg/dL 0.5 0.5 -  Alkaline Phos 38 - 126 U/L 87 71 -  AST 15 - 41 U/L 16 14(L) -  ALT 0 - 44 U/L 16 19 -       DIAGNOSTIC IMAGING:  I have independently reviewed the scans and discussed with the patient.   I have reviewed Megan [name]'s note and agree with the documentation.  I personally performed a face-to-face visit, made revisions and my assessment and plan is as follows.  ASSESSMENT & PLAN:   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[place]) 1.  Stage Ib (T1CN0) right breast cancer: -Screening mammogram on 02/18/2018 suggests right breast mass.  Right breast additional views on 02/26/2018 showed a 0.5 x 0.3 x 0.4 cm mass in the right breast.  Ultrasound biopsy was recommended. -Right breast biopsy on 03/05/2018 shows benign breast tissue with areas of dense hyalin fibrosis. -Right breast mammogram on 08/20/2018 shows suspicious mass in the 1 o'clock position of the right breast with no adenopathy. -Left breast mammogram on 08/27/2018 was BI-RADS Category 1. -Right breast biopsy on 08/27/2018 at 1:00 consistent with infiltrating ductal carcinoma. - Right lumpectomy on 09/18/2018 shows 1.5 cm invasive  ductal carcinoma, grade 3, associated high-grade DCIS, negative margins, 0/3 lymph nodes positive.  ER 50% positive, PR negative, HER-2 negative, Ki 67-60%. - We discussed the pathology report in detail. - Today we discussed about the Oncotype DX test results.  Recurrence score is 47.  Distant recurrence risk at 9 years with tamoxifen alone is 36%.  Absolute chemotherapy benefit is more than 15% with her recurrent score. - RT-PCR analysis of ER expression of the specimen shows ER was negative.  However ER was 50% positive by IHC. - Because of high recurrence score on Oncotype DX, she was recommended adjuvant chemotherapy. -She received first cycle of AC on 10/30/2018. -She denied any chemotherapy related side effects.  She did not have any side effects from Neulasta. - We reviewed her blood work.  Apart from anemia, other blood work was more or less stable.  She had mild hypokalemia.  We will give her 20 mEq of potassium. - We would hold off on using any erythropoiesis stimulating agents during her chemotherapy as the treatment intent is curative.  She will receive transfusions as needed. -She may proceed with cycle 2 today.  I will see her back in 3 weeks for follow-up.  2.  ESRD on HD: -She has been hemodialysis for the past 1 and half year. -ESRD thought to be secondary to hypertension.  3.  Neuropathy: -She developed neuropathy in the past 8 months, etiology unknown.  She has burning sensation in the bottom of her both feet, predominantly in the toes.  No neuropathic symptoms in the hands. -She is currently taking Cymbalta and gabapentin.   Total time spent is 25 minutes with more than 50% of the time spent face-to-face discussing treatment related side effects and coordination of care.    Orders placed this encounter:  No orders of the defined types were placed in this encounter.     Derek Jack, MD [place] [phone]

## 2018-11-20 NOTE — Assessment & Plan Note (Signed)
1.  Stage Ib (T1CN0) right breast cancer: -Screening mammogram on 02/18/2018 suggests right breast mass.  Right breast additional views on 02/26/2018 showed a 0.5 x 0.3 x 0.4 cm mass in the right breast.  Ultrasound biopsy was recommended. -Right breast biopsy on 03/05/2018 shows benign breast tissue with areas of dense hyalin fibrosis. -Right breast mammogram on 08/20/2018 shows suspicious mass in the 1 o'clock position of the right breast with no adenopathy. -Left breast mammogram on 08/27/2018 was BI-RADS Category 1. -Right breast biopsy on 08/27/2018 at 1:00 consistent with infiltrating ductal carcinoma. 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  ER 50% positive, PR negative, HER-2 negative, Ki 67-60%. - We discussed the pathology report in detail. - Today we discussed about the Oncotype DX test results.  Recurrence score is 47.  Distant recurrence risk at 9 years with tamoxifen alone is 36%.  Absolute chemotherapy benefit is more than 15% with her recurrent score. - RT-PCR analysis of ER expression of the specimen shows ER was negative.  However ER was 50% positive by IHC. - Because of high recurrence score on Oncotype DX, she was recommended adjuvant chemotherapy. -She received first cycle of AC on 10/30/2018. -She denied any chemotherapy related side effects.  She did not have any side effects from Neulasta. - We reviewed her blood work.  Apart from anemia, other blood work was more or less stable.  She had mild hypokalemia.  We will give her 20 mEq of potassium. - We would hold off on using any erythropoiesis stimulating agents during her chemotherapy as the treatment intent is curative.  She will receive transfusions as needed. -She may proceed with cycle 2 today.  I will see her back in 3 weeks for follow-up.  2.  ESRD on HD: -She has been hemodialysis for the past 1 and half year. -ESRD  thought to be secondary to hypertension.  3.  Neuropathy: -She developed neuropathy in the past 8 months, etiology unknown.  She has burning sensation in the bottom of her both feet, predominantly in the toes.  No neuropathic symptoms in the hands. -She is currently taking Cymbalta and gabapentin.

## 2018-11-20 NOTE — Patient Instructions (Addendum)
[place] at [facility] Discharge Instructions   You were seen today by Dr. Delton Coombes.  Your red blood cells have dropped a little.  This is due to treatment.  You will proceed with treatment today and we will see you back in 3 weeks with your next treatment.    Thank you for choosing [place] at [facility] to provide your oncology and hematology care.  To afford each patient quality time with our provider, please arrive at least 15 minutes before your scheduled appointment time.   If you have a lab appointment with the [facility] please come in thru the  Main Entrance and check in at the main information desk  You need to re-schedule your appointment should you arrive 10 or more minutes late.  We strive to give you quality time with our providers, and arriving late affects you and other patients whose appointments are after yours.  Also, if you no show three or more times for appointments you may be dismissed from the clinic at the providers discretion.     Again, thank you for choosing [facility].  Our hope is that these requests will decrease the amount of time that you wait before being seen by our physicians.       _____________________________________________________________  Should you have questions after your visit to [facility], please contact our office at [phone] between the hours of 8:00 a.m. and 4:30 p.m.  Voicemails left after 4:00 p.m. will not be returned until the following business day.  For prescription refill requests, have your pharmacy contact our office and allow 72 hours.    Cancer Center Support Programs:   > Cancer Support Group  2nd Tuesday of the month 1pm-2pm, Journey Room

## 2018-11-21 DIAGNOSIS — E1129 Type 2 diabetes mellitus with other diabetic kidney complication: Secondary | ICD-10-CM | POA: Diagnosis not present

## 2018-11-21 DIAGNOSIS — E119 Type 2 diabetes mellitus without complications: Secondary | ICD-10-CM | POA: Diagnosis not present

## 2018-11-21 DIAGNOSIS — N186 End stage renal disease: Secondary | ICD-10-CM | POA: Diagnosis not present

## 2018-11-21 DIAGNOSIS — E876 Hypokalemia: Secondary | ICD-10-CM | POA: Diagnosis not present

## 2018-11-21 DIAGNOSIS — N2581 Secondary hyperparathyroidism of renal origin: Secondary | ICD-10-CM | POA: Diagnosis not present

## 2018-11-23 DIAGNOSIS — E119 Type 2 diabetes mellitus without complications: Secondary | ICD-10-CM | POA: Diagnosis not present

## 2018-11-23 DIAGNOSIS — N2581 Secondary hyperparathyroidism of renal origin: Secondary | ICD-10-CM | POA: Diagnosis not present

## 2018-11-23 DIAGNOSIS — N186 End stage renal disease: Secondary | ICD-10-CM | POA: Diagnosis not present

## 2018-11-23 DIAGNOSIS — E876 Hypokalemia: Secondary | ICD-10-CM | POA: Diagnosis not present

## 2018-11-23 DIAGNOSIS — E1129 Type 2 diabetes mellitus with other diabetic kidney complication: Secondary | ICD-10-CM | POA: Diagnosis not present

## 2018-11-25 ENCOUNTER — Inpatient Hospital Stay (HOSPITAL_COMMUNITY): Payer: Medicare Other

## 2018-11-25 ENCOUNTER — Encounter (HOSPITAL_COMMUNITY): Payer: Self-pay

## 2018-11-25 ENCOUNTER — Other Ambulatory Visit: Payer: Self-pay

## 2018-11-25 DIAGNOSIS — N186 End stage renal disease: Secondary | ICD-10-CM | POA: Diagnosis not present

## 2018-11-25 DIAGNOSIS — Z170 Estrogen receptor positive status [ER+]: Secondary | ICD-10-CM | POA: Diagnosis not present

## 2018-11-25 DIAGNOSIS — C50211 Malignant neoplasm of upper-inner quadrant of right female breast: Secondary | ICD-10-CM | POA: Diagnosis not present

## 2018-11-25 DIAGNOSIS — I129 Hypertensive chronic kidney disease with stage 1 through stage 4 chronic kidney disease, or unspecified chronic kidney disease: Secondary | ICD-10-CM | POA: Diagnosis not present

## 2018-11-25 DIAGNOSIS — Z7689 Persons encountering health services in other specified circumstances: Secondary | ICD-10-CM | POA: Diagnosis not present

## 2018-11-25 DIAGNOSIS — Z5111 Encounter for antineoplastic chemotherapy: Secondary | ICD-10-CM | POA: Diagnosis not present

## 2018-11-25 MED ORDER — HEPARIN SOD (PORK) LOCK FLUSH 100 UNIT/ML IV SOLN
250.0000 [IU] | Freq: Once | INTRAVENOUS | Status: AC
  Administered 2018-11-25: 250 [IU] via INTRAVENOUS

## 2018-11-25 MED ORDER — SODIUM CHLORIDE 0.9% FLUSH
10.0000 mL | INTRAVENOUS | Status: DC | PRN
  Administered 2018-11-25: 10 mL via INTRAVENOUS
  Filled 2018-11-25: qty 10

## 2018-11-26 DIAGNOSIS — N186 End stage renal disease: Secondary | ICD-10-CM | POA: Diagnosis not present

## 2018-11-26 DIAGNOSIS — E876 Hypokalemia: Secondary | ICD-10-CM | POA: Diagnosis not present

## 2018-11-26 DIAGNOSIS — E119 Type 2 diabetes mellitus without complications: Secondary | ICD-10-CM | POA: Diagnosis not present

## 2018-11-26 DIAGNOSIS — N2581 Secondary hyperparathyroidism of renal origin: Secondary | ICD-10-CM | POA: Diagnosis not present

## 2018-11-26 DIAGNOSIS — E1129 Type 2 diabetes mellitus with other diabetic kidney complication: Secondary | ICD-10-CM | POA: Diagnosis not present

## 2018-11-28 DIAGNOSIS — E039 Hypothyroidism, unspecified: Secondary | ICD-10-CM | POA: Diagnosis not present

## 2018-11-28 DIAGNOSIS — N2581 Secondary hyperparathyroidism of renal origin: Secondary | ICD-10-CM | POA: Diagnosis not present

## 2018-11-28 DIAGNOSIS — N186 End stage renal disease: Secondary | ICD-10-CM | POA: Diagnosis not present

## 2018-11-28 DIAGNOSIS — E119 Type 2 diabetes mellitus without complications: Secondary | ICD-10-CM | POA: Diagnosis not present

## 2018-11-28 DIAGNOSIS — E1129 Type 2 diabetes mellitus with other diabetic kidney complication: Secondary | ICD-10-CM | POA: Diagnosis not present

## 2018-11-28 DIAGNOSIS — E876 Hypokalemia: Secondary | ICD-10-CM | POA: Diagnosis not present

## 2018-11-29 ENCOUNTER — Inpatient Hospital Stay (HOSPITAL_COMMUNITY): Payer: Medicare Other

## 2018-11-29 ENCOUNTER — Other Ambulatory Visit: Payer: Self-pay

## 2018-11-29 DIAGNOSIS — Z7689 Persons encountering health services in other specified circumstances: Secondary | ICD-10-CM | POA: Diagnosis not present

## 2018-11-29 DIAGNOSIS — N186 End stage renal disease: Secondary | ICD-10-CM | POA: Diagnosis not present

## 2018-11-29 DIAGNOSIS — C50211 Malignant neoplasm of upper-inner quadrant of right female breast: Secondary | ICD-10-CM | POA: Diagnosis not present

## 2018-11-29 DIAGNOSIS — Z5111 Encounter for antineoplastic chemotherapy: Secondary | ICD-10-CM | POA: Diagnosis not present

## 2018-11-29 DIAGNOSIS — I129 Hypertensive chronic kidney disease with stage 1 through stage 4 chronic kidney disease, or unspecified chronic kidney disease: Secondary | ICD-10-CM | POA: Diagnosis not present

## 2018-11-29 DIAGNOSIS — Z170 Estrogen receptor positive status [ER+]: Secondary | ICD-10-CM | POA: Diagnosis not present

## 2018-11-29 MED ORDER — HEPARIN SOD (PORK) LOCK FLUSH 100 UNIT/ML IV SOLN
500.0000 [IU] | Freq: Once | INTRAVENOUS | Status: AC
  Administered 2018-11-29: 500 [IU] via INTRAVENOUS

## 2018-11-29 MED ORDER — SODIUM CHLORIDE 0.9% FLUSH
10.0000 mL | Freq: Once | INTRAVENOUS | Status: AC
  Administered 2018-11-29: 10 mL

## 2018-11-30 DIAGNOSIS — N186 End stage renal disease: Secondary | ICD-10-CM | POA: Diagnosis not present

## 2018-11-30 DIAGNOSIS — E1129 Type 2 diabetes mellitus with other diabetic kidney complication: Secondary | ICD-10-CM | POA: Diagnosis not present

## 2018-11-30 DIAGNOSIS — E119 Type 2 diabetes mellitus without complications: Secondary | ICD-10-CM | POA: Diagnosis not present

## 2018-11-30 DIAGNOSIS — N2581 Secondary hyperparathyroidism of renal origin: Secondary | ICD-10-CM | POA: Diagnosis not present

## 2018-11-30 DIAGNOSIS — E876 Hypokalemia: Secondary | ICD-10-CM | POA: Diagnosis not present

## 2018-12-02 ENCOUNTER — Inpatient Hospital Stay (HOSPITAL_COMMUNITY): Payer: Medicare Other

## 2018-12-02 ENCOUNTER — Other Ambulatory Visit: Payer: Self-pay

## 2018-12-02 DIAGNOSIS — N186 End stage renal disease: Secondary | ICD-10-CM | POA: Diagnosis not present

## 2018-12-02 DIAGNOSIS — C50211 Malignant neoplasm of upper-inner quadrant of right female breast: Secondary | ICD-10-CM | POA: Diagnosis not present

## 2018-12-02 DIAGNOSIS — Z5111 Encounter for antineoplastic chemotherapy: Secondary | ICD-10-CM | POA: Diagnosis not present

## 2018-12-02 DIAGNOSIS — Z170 Estrogen receptor positive status [ER+]: Secondary | ICD-10-CM | POA: Diagnosis not present

## 2018-12-02 DIAGNOSIS — I129 Hypertensive chronic kidney disease with stage 1 through stage 4 chronic kidney disease, or unspecified chronic kidney disease: Secondary | ICD-10-CM | POA: Diagnosis not present

## 2018-12-02 DIAGNOSIS — Z7689 Persons encountering health services in other specified circumstances: Secondary | ICD-10-CM | POA: Diagnosis not present

## 2018-12-02 MED ORDER — HEPARIN SOD (PORK) LOCK FLUSH 100 UNIT/ML IV SOLN
250.0000 [IU] | Freq: Once | INTRAVENOUS | Status: AC
  Administered 2018-12-02: 250 [IU] via INTRAVENOUS

## 2018-12-02 MED ORDER — SODIUM CHLORIDE 0.9% FLUSH
10.0000 mL | Freq: Once | INTRAVENOUS | Status: AC
  Administered 2018-12-02: 10 mL via INTRAVENOUS

## 2018-12-03 DIAGNOSIS — N186 End stage renal disease: Secondary | ICD-10-CM | POA: Diagnosis not present

## 2018-12-03 DIAGNOSIS — E119 Type 2 diabetes mellitus without complications: Secondary | ICD-10-CM | POA: Diagnosis not present

## 2018-12-03 DIAGNOSIS — E876 Hypokalemia: Secondary | ICD-10-CM | POA: Diagnosis not present

## 2018-12-03 DIAGNOSIS — E1129 Type 2 diabetes mellitus with other diabetic kidney complication: Secondary | ICD-10-CM | POA: Diagnosis not present

## 2018-12-03 DIAGNOSIS — N2581 Secondary hyperparathyroidism of renal origin: Secondary | ICD-10-CM | POA: Diagnosis not present

## 2018-12-05 DIAGNOSIS — N2581 Secondary hyperparathyroidism of renal origin: Secondary | ICD-10-CM | POA: Diagnosis not present

## 2018-12-05 DIAGNOSIS — E1129 Type 2 diabetes mellitus with other diabetic kidney complication: Secondary | ICD-10-CM | POA: Diagnosis not present

## 2018-12-05 DIAGNOSIS — N186 End stage renal disease: Secondary | ICD-10-CM | POA: Diagnosis not present

## 2018-12-05 DIAGNOSIS — E876 Hypokalemia: Secondary | ICD-10-CM | POA: Diagnosis not present

## 2018-12-05 DIAGNOSIS — E119 Type 2 diabetes mellitus without complications: Secondary | ICD-10-CM | POA: Diagnosis not present

## 2018-12-06 ENCOUNTER — Other Ambulatory Visit: Payer: Self-pay

## 2018-12-06 ENCOUNTER — Inpatient Hospital Stay (HOSPITAL_COMMUNITY): Payer: Medicare Other

## 2018-12-06 ENCOUNTER — Encounter (HOSPITAL_COMMUNITY): Payer: Self-pay

## 2018-12-06 DIAGNOSIS — C50211 Malignant neoplasm of upper-inner quadrant of right female breast: Secondary | ICD-10-CM | POA: Diagnosis not present

## 2018-12-06 DIAGNOSIS — Z5111 Encounter for antineoplastic chemotherapy: Secondary | ICD-10-CM | POA: Diagnosis not present

## 2018-12-06 DIAGNOSIS — I129 Hypertensive chronic kidney disease with stage 1 through stage 4 chronic kidney disease, or unspecified chronic kidney disease: Secondary | ICD-10-CM | POA: Diagnosis not present

## 2018-12-06 DIAGNOSIS — Z7689 Persons encountering health services in other specified circumstances: Secondary | ICD-10-CM | POA: Diagnosis not present

## 2018-12-06 DIAGNOSIS — N186 End stage renal disease: Secondary | ICD-10-CM | POA: Diagnosis not present

## 2018-12-06 DIAGNOSIS — Z170 Estrogen receptor positive status [ER+]: Secondary | ICD-10-CM | POA: Diagnosis not present

## 2018-12-06 MED ORDER — HEPARIN SOD (PORK) LOCK FLUSH 100 UNIT/ML IV SOLN
250.0000 [IU] | Freq: Once | INTRAVENOUS | Status: AC
  Administered 2018-12-06: 09:00:00 250 [IU] via INTRAVENOUS

## 2018-12-06 MED ORDER — SODIUM CHLORIDE 0.9% FLUSH
10.0000 mL | INTRAVENOUS | Status: DC | PRN
  Administered 2018-12-06: 10 mL via INTRAVENOUS
  Filled 2018-12-06: qty 10

## 2018-12-07 DIAGNOSIS — N186 End stage renal disease: Secondary | ICD-10-CM | POA: Diagnosis not present

## 2018-12-07 DIAGNOSIS — E119 Type 2 diabetes mellitus without complications: Secondary | ICD-10-CM | POA: Diagnosis not present

## 2018-12-07 DIAGNOSIS — E876 Hypokalemia: Secondary | ICD-10-CM | POA: Diagnosis not present

## 2018-12-07 DIAGNOSIS — N2581 Secondary hyperparathyroidism of renal origin: Secondary | ICD-10-CM | POA: Diagnosis not present

## 2018-12-07 DIAGNOSIS — E1129 Type 2 diabetes mellitus with other diabetic kidney complication: Secondary | ICD-10-CM | POA: Diagnosis not present

## 2018-12-09 ENCOUNTER — Inpatient Hospital Stay (HOSPITAL_COMMUNITY): Payer: Medicare Other

## 2018-12-09 ENCOUNTER — Other Ambulatory Visit: Payer: Self-pay

## 2018-12-09 DIAGNOSIS — Z5111 Encounter for antineoplastic chemotherapy: Secondary | ICD-10-CM | POA: Diagnosis not present

## 2018-12-09 DIAGNOSIS — C50211 Malignant neoplasm of upper-inner quadrant of right female breast: Secondary | ICD-10-CM | POA: Diagnosis not present

## 2018-12-09 DIAGNOSIS — Z7689 Persons encountering health services in other specified circumstances: Secondary | ICD-10-CM | POA: Diagnosis not present

## 2018-12-09 DIAGNOSIS — N186 End stage renal disease: Secondary | ICD-10-CM | POA: Diagnosis not present

## 2018-12-09 DIAGNOSIS — I129 Hypertensive chronic kidney disease with stage 1 through stage 4 chronic kidney disease, or unspecified chronic kidney disease: Secondary | ICD-10-CM | POA: Diagnosis not present

## 2018-12-09 DIAGNOSIS — Z170 Estrogen receptor positive status [ER+]: Secondary | ICD-10-CM | POA: Diagnosis not present

## 2018-12-09 MED ORDER — SODIUM CHLORIDE 0.9% FLUSH
10.0000 mL | INTRAVENOUS | Status: DC | PRN
  Administered 2018-12-09: 10 mL via INTRAVENOUS
  Filled 2018-12-09: qty 10

## 2018-12-09 MED ORDER — HEPARIN SOD (PORK) LOCK FLUSH 100 UNIT/ML IV SOLN
500.0000 [IU] | Freq: Once | INTRAVENOUS | Status: AC
  Administered 2018-12-09: 250 [IU] via INTRAVENOUS

## 2018-12-09 NOTE — Progress Notes (Signed)
PICC line dressing change using sterile technique.  PICC line flushed without resistance.  Patient discharged ambulatory and in stable condition.  Follow up later this week as scheduled.

## 2018-12-10 ENCOUNTER — Encounter (HOSPITAL_COMMUNITY): Payer: Self-pay

## 2018-12-10 ENCOUNTER — Emergency Department (HOSPITAL_COMMUNITY)
Admission: EM | Admit: 2018-12-10 | Discharge: 2018-12-10 | Disposition: A | Discharge status: Home / Self Care | Payer: Medicare Other | Attending: Emergency Medicine | Admitting: Emergency Medicine

## 2018-12-10 ENCOUNTER — Other Ambulatory Visit: Payer: Self-pay

## 2018-12-10 ENCOUNTER — Other Ambulatory Visit (HOSPITAL_COMMUNITY): Payer: Self-pay

## 2018-12-10 DIAGNOSIS — I120 Hypertensive chronic kidney disease with stage 5 chronic kidney disease or end stage renal disease: Secondary | ICD-10-CM | POA: Insufficient documentation

## 2018-12-10 DIAGNOSIS — Z79899 Other long term (current) drug therapy: Secondary | ICD-10-CM | POA: Insufficient documentation

## 2018-12-10 DIAGNOSIS — Z992 Dependence on renal dialysis: Secondary | ICD-10-CM | POA: Diagnosis not present

## 2018-12-10 DIAGNOSIS — N2581 Secondary hyperparathyroidism of renal origin: Secondary | ICD-10-CM | POA: Diagnosis not present

## 2018-12-10 DIAGNOSIS — Z7982 Long term (current) use of aspirin: Secondary | ICD-10-CM | POA: Diagnosis not present

## 2018-12-10 DIAGNOSIS — D649 Anemia, unspecified: Secondary | ICD-10-CM | POA: Diagnosis not present

## 2018-12-10 DIAGNOSIS — C50211 Malignant neoplasm of upper-inner quadrant of right female breast: Secondary | ICD-10-CM

## 2018-12-10 DIAGNOSIS — E876 Hypokalemia: Secondary | ICD-10-CM | POA: Diagnosis not present

## 2018-12-10 DIAGNOSIS — Z170 Estrogen receptor positive status [ER+]: Principal | ICD-10-CM

## 2018-12-10 DIAGNOSIS — D631 Anemia in chronic kidney disease: Secondary | ICD-10-CM | POA: Diagnosis not present

## 2018-12-10 DIAGNOSIS — N186 End stage renal disease: Secondary | ICD-10-CM | POA: Diagnosis not present

## 2018-12-10 DIAGNOSIS — E119 Type 2 diabetes mellitus without complications: Secondary | ICD-10-CM | POA: Insufficient documentation

## 2018-12-10 DIAGNOSIS — R42 Dizziness and giddiness: Secondary | ICD-10-CM | POA: Diagnosis not present

## 2018-12-10 DIAGNOSIS — E1129 Type 2 diabetes mellitus with other diabetic kidney complication: Secondary | ICD-10-CM | POA: Diagnosis not present

## 2018-12-10 LAB — BASIC METABOLIC PANEL
Anion gap: 11 (ref 5–15)
BUN: 55 mg/dL — ABNORMAL HIGH (ref 6–20)
CO2: 24 mmol/L (ref 22–32)
Calcium: 8.4 mg/dL — ABNORMAL LOW (ref 8.9–10.3)
Chloride: 103 mmol/L (ref 98–111)
Creatinine, Ser: 10.95 mg/dL — ABNORMAL HIGH (ref 0.44–1.00)
GFR calc Af Amer: 4 mL/min — ABNORMAL LOW (ref >60)
GFR calc non Af Amer: 3 mL/min — ABNORMAL LOW (ref >60)
Glucose, Bld: 206 mg/dL — ABNORMAL HIGH (ref 70–99)
Potassium: 3.5 mmol/L (ref 3.5–5.1)
Sodium: 138 mmol/L (ref 135–145)

## 2018-12-10 LAB — PREPARE RBC (CROSSMATCH)

## 2018-12-10 LAB — CBC
HCT: 23.6 % — ABNORMAL LOW (ref 36.0–46.0)
Hemoglobin: 7.5 g/dL — ABNORMAL LOW (ref 12.0–15.0)
MCH: 32.5 pg (ref 26.0–34.0)
MCHC: 31.8 g/dL (ref 30.0–36.0)
MCV: 102.2 fL — ABNORMAL HIGH (ref 80.0–100.0)
Platelets: 281 10*3/uL (ref 150–400)
RBC: 2.31 MIL/uL — ABNORMAL LOW (ref 3.87–5.11)
RDW: 15.9 % — ABNORMAL HIGH (ref 11.5–15.5)
WBC: 10.2 10*3/uL (ref 4.0–10.5)
nRBC: 0 % (ref 0.0–0.2)

## 2018-12-10 LAB — CBG MONITORING, ED: Glucose-Capillary: 193 mg/dL — ABNORMAL HIGH (ref 70–99)

## 2018-12-10 LAB — RETICULOCYTES
Immature Retic Fract: 23.7 % — ABNORMAL HIGH (ref 2.3–15.9)
RBC.: 2.26 MIL/uL — ABNORMAL LOW (ref 3.87–5.11)
Retic Count, Absolute: 24 10*3/uL (ref 19.0–186.0)
Retic Ct Pct: 1.1 % (ref 0.4–3.1)

## 2018-12-10 LAB — IRON AND TIBC
Iron: 82 ug/dL (ref 28–170)
Saturation Ratios: 43 % — ABNORMAL HIGH (ref 10.4–31.8)
TIBC: 190 ug/dL — ABNORMAL LOW (ref 250–450)
UIBC: 108 ug/dL

## 2018-12-10 LAB — FERRITIN: Ferritin: 1308 ng/mL — ABNORMAL HIGH (ref 11–307)

## 2018-12-10 LAB — POC OCCULT BLOOD, ED: Fecal Occult Bld: NEGATIVE

## 2018-12-10 LAB — VITAMIN B12: Vitamin B-12: 1226 pg/mL — ABNORMAL HIGH (ref 180–914)

## 2018-12-10 LAB — ABO/RH: ABO/RH(D): B POS

## 2018-12-10 LAB — FOLATE: Folate: 18.5 ng/mL (ref >5.9)

## 2018-12-10 MED ORDER — SODIUM CHLORIDE 0.9 % IV SOLN: 10.0000 mL/h | Freq: Once | INTRAVENOUS | Status: DC

## 2018-12-10 NOTE — ED Notes (Signed)
edp in room  

## 2018-12-10 NOTE — ED Notes (Signed)
Occult Card Negative

## 2018-12-10 NOTE — ED Provider Notes (Addendum)
[facility] EMERGENCY DEPARTMENT Provider Note   CSN: [id number] Arrival date & time: 12/10/18  7628    History   Chief Complaint Chief Complaint  Patient presents with  . Dizziness    "low BP at home"     HPI Megan [name] is a 58 y.o. female.     Patient w hx esrd/hd, due for hd this AM, states got up for her dialysis, and felt faint/lightheaded, and bp was low, 60/. Symptoms acute onset, moderate, lasted a few minutes, now improved.  Patient indicates did eat/drink something and is feeling a bit better now. States when went to bed last night, felt fine. Recent normal appetite, normal po intake. No nvd. No fever or chills. Denies headache. No chest pain or discomfort. No sob or unusual doe. No recent blood loss, vaginal bleeding, rectal bleeding or melena. No recent change in meds/new meds. Last chemo approx 1 month ago.   The history is provided by the patient.  Dizziness  Associated symptoms: no blood in stool, no chest pain, no diarrhea, no headaches, no palpitations, no shortness of breath, no vomiting and no weakness     Past Medical History:  Diagnosis Date  . Anemia   . Blood transfusion without reported diagnosis   . Cataract   . CKD (chronic kidney disease) stage 3, GFR 30-59 ml/min (HCC)   . Essential hypertension, benign   . GERD (gastroesophageal reflux disease)   . Headache   . Hemorrhoids   . Mixed hyperlipidemia   . PONV (postoperative nausea and vomiting)   . Renal insufficiency   . S/P colonoscopy Jan 2011   Dr. Benson Norway: sessile polyp (benign lymphoid), large hemorrhoids, repeat 5-10 years  . Temporal arteritis ([place])   . Type 2 diabetes mellitus ([place])   . Wears glasses     Patient Active Problem List   Diagnosis Date Noted  . 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 Rectal bleeding 06/08/2016  . Vaginal discharge 12/30/2015  . Abdominal pain, epigastric 11/26/2013  . GERD (gastroesophageal reflux disease)    . Type 2 diabetes mellitus ([place])   . CKD (chronic kidney disease) stage 3, GFR 30-59 ml/min (HCC)   . External hemorrhoid 08/01/2011  . Hematochezia 07/13/2011  . Atypical chest pain 04/21/2010  . Mixed hyperlipidemia 03/11/2008  . OBESITY 03/11/2008  . Essential hypertension, benign 03/11/2008    Past Surgical History:  Procedure Laterality Date  . ABDOMINAL HYSTERECTOMY    . APPENDECTOMY    . ARTERY BIOPSY N/A 05/09/2018   Procedure: RIGHT TEMPORAL ARTERY BIOPSY;  Surgeon: Judeth Horn, MD;  Location: [place];  Service: General;  Laterality: N/A;  . CATARACT EXTRACTION W/PHACO Left 02/09/2017   Procedure: CATARACT EXTRACTION PHACO AND INTRAOCULAR LENS PLACEMENT LEFT EYE;  Surgeon: Tonny Branch, MD;  Location: [facility];  Service: Ophthalmology;  Laterality: Left;  CDE: 4.89  . CATARACT EXTRACTION W/PHACO Right 06/04/2017   Procedure: CATARACT EXTRACTION PHACO AND INTRAOCULAR LENS PLACEMENT (IOC);  Surgeon: Tonny Branch, MD;  Location: [facility];  Service: Ophthalmology;  Laterality: Right;  CDE: 4.12  . CHOLECYSTECTOMY  09/29/2011   Procedure: LAPAROSCOPIC CHOLECYSTECTOMY;  Surgeon: Jamesetta So, MD;  Location: [facility];  Service: General;  Laterality: N/A;  . COLONOSCOPY  Jan 2011   Dr. Benson Norway: sessile polyp (benign lymphoid), large hemorrhoids, repeat 5-10 years  . COLONOSCOPY N/A 06/12/2016   Procedure: COLONOSCOPY;  Surgeon: Daneil Dolin, MD;  Location: [facility];  Service:  Endoscopy;  Laterality: N/A;  1230   . ESOPHAGOGASTRODUODENOSCOPY  09/05/2011   QAS:TMHDQ hiatal hernia; remainder of exam normal. No explanation for patient's abdominal pain with today's examination  . ESOPHAGOGASTRODUODENOSCOPY N/A 12/17/2013   Dr. [name]: gastric erythema, erosion, mild chronic inflammation on path   . LAPAROSCOPIC APPENDECTOMY  09/29/2011   Procedure: APPENDECTOMY LAPAROSCOPIC;  Surgeon: Jamesetta So, MD;  Location: [facility];  Service: General;;  incidental appendectomy  . PARTIAL MASTECTOMY WITH  NEEDLE LOCALIZATION AND AXILLARY SENTINEL LYMPH NODE BX Right 09/18/2018   Procedure: RIGHT PARTIAL MASTECTOMY AFTER NEEDLE LOCALIZATION, SENTINEL LYMPH NODE BIOPSY RIGHT AXILLA;  Surgeon: Aviva Signs, MD;  Location: [facility];  Service: General;  Laterality: Right;     OB History   No obstetric history on file.      Home Medications    Prior to Admission medications   Medication Sig Start Date End Date Taking? Authorizing Provider  amLODipine (NORVASC) 5 MG tablet Take 1 tablet (5 mg total) by mouth daily. Patient taking differently: Take 5 mg by mouth daily as needed (blood pressure 159 or above).  08/19/18   [name], MD  aspirin EC 81 MG tablet Take 81 mg by mouth daily.    [provider]  atorvastatin (LIPITOR) 20 MG tablet Take 1 tablet (20 mg total) by mouth daily. Patient taking differently: Take 20 mg by mouth at bedtime.  08/16/18   [name], MD  B Complex-C-Folic Acid (DIALYVITE 222) 0.8 MG TABS Take 800 mg by mouth daily.  01/22/18   [provider]  butalbital-acetaminophen-caffeine (FIORICET, ESGIC) 50-325-40 MG tablet Take 1 tablet by mouth every 6 (six) hours as needed for headache. 06/04/18 06/04/19  [name], MD  cinacalcet (SENSIPAR) 30 MG tablet Take 2 tablets (60 mg total) by mouth daily. Patient taking differently: Take 30 mg by mouth daily.  06/13/18   [name], MD  DULoxetine (CYMBALTA) 30 MG capsule Take 1 capsule (30 mg total) by mouth daily. 10/03/18   [name], MD  gabapentin (NEURONTIN) 300 MG capsule TAKE 1 CAPSULE(300 MG) BY MOUTH AT BEDTIME 09/16/18   [name], MD  HYDROcodone-acetaminophen (NORCO) 5-325 MG tablet Take 1 tablet by mouth every 4 (four) hours as needed for moderate pain. 09/18/18   Aviva Signs, MD  lanthanum (FOSRENOL) 1000 MG chewable tablet Chew 1,000-2,000 mg by mouth See admin instructions. Take 2000 mg by mouth twice daily with meals and 1000 with a snack 07/01/18   [provider]  lidocaine-prilocaine (EMLA) cream APPLY SMALL AMOUNT TO ACCESS SITE (AVF) 1 TO 2 HOURS BEFORE DIALYSIS. COVER WITH OCCLUSIVE DRESSING (SARAN WRAP) 09/24/18   [provider]  losartan (COZAAR) 50 MG tablet Take 1 tablet (50 mg total) by mouth daily. Patient taking differently: Take 50 mg by mouth at bedtime.  03/28/18   [name], MD  nebivolol (BYSTOLIC) 10 MG tablet Take 1 tablet (10 mg total) by mouth 2 (two) times daily. 06/13/18   [name], MD  ondansetron (ZOFRAN) 4 MG tablet Take 1 tablet (4 mg total) by mouth every 8 (eight) hours as needed for nausea or vomiting. 09/18/18   Aviva Signs, MD  pantoprazole (PROTONIX) 40 MG tablet TAKE 1 TABLET(40 MG) BY MOUTH DAILY Patient taking differently: Take 40 mg by mouth daily before breakfast.  08/19/18   [name], MD  prochlorperazine (COMPAZINE) 10 MG tablet Take 1 tablet (10 mg total) by mouth every 6 (  six) hours as needed (Nausea or vomiting). 10/29/18   Derek Jack, MD  tiZANidine (ZANAFLEX) 4 MG capsule TAKE 1 CAPSULE(4 MG) BY MOUTH TWICE DAILY WITH A MEAL. START 1 CAPSULE AT NIGHT FOR 2 WEEKS AND THEN TWICE DAILY 09/16/18   Garvin Fila, MD    Family History Family History  Problem Relation Age of Onset  . Hypertension Mother   . Coronary artery disease Mother   . Diabetes Mother   . Hypertension Sister   . Coronary artery disease Sister   . Hypertension Brother   . Heart attack Father   . Hypertension Son   . Heart attack Maternal Aunt   . Hypertension Maternal Aunt   . Diabetes Maternal Aunt   . Heart attack Maternal Uncle   . Hypertension Maternal Uncle   . Diabetes Maternal Uncle   . Heart attack Paternal Aunt   . Hypertension Paternal Aunt   . Diabetes Paternal Aunt   . Heart attack Paternal Uncle   . Hypertension Paternal Uncle   . Diabetes Paternal Uncle   . Heart attack Maternal Grandmother   . Heart attack Maternal Grandfather   . Heart attack Paternal  Grandmother   . Heart attack Paternal Grandfather   . Colon cancer Neg Hx     Social History Social History   Tobacco Use  . Smoking status: Never Smoker  . Smokeless tobacco: Never Used  Substance Use Topics  . Alcohol use: No  . Drug use: No     Allergies   Patient has no known allergies.   Review of Systems Review of Systems  Constitutional: Negative for chills and fever.  HENT: Negative for sore throat.   Eyes: Negative for visual disturbance.  Respiratory: Negative for cough and shortness of breath.   Cardiovascular: Negative for chest pain, palpitations and leg swelling.  Gastrointestinal: Negative for abdominal pain, blood in stool, diarrhea and vomiting.  Endocrine: Negative for polyuria.  Genitourinary: Negative for dysuria and flank pain.       States makes very little urine at baseline.   Musculoskeletal: Negative for back pain and neck pain.  Skin: Negative for rash.  Neurological: Positive for dizziness and light-headedness. Negative for weakness, numbness and headaches.  Hematological: Does not bruise/bleed easily.  Psychiatric/Behavioral: Negative for confusion.     Physical Exam Updated Vital Signs BP 101/66 (BP Location: Right Wrist)   Pulse 86   Temp (!) 97.5 F (36.4 C) (Oral)   Resp 18   Ht 1.575 m (5\' 2" )   Wt 78.5 kg   SpO2 97%   BMI 31.64 kg/m   Physical Exam Vitals signs and nursing note reviewed.  Constitutional:      Appearance: Normal appearance. She is well-developed.  HENT:     Head: Atraumatic.     Nose: Nose normal.     Mouth/Throat:     Mouth: Mucous membranes are moist.  Eyes:     General: No scleral icterus.    Conjunctiva/sclera: Conjunctivae normal.     Pupils: Pupils are equal, round, and reactive to light.  Neck:     Musculoskeletal: Normal range of motion and neck supple. No neck rigidity or muscular tenderness.     Vascular: No carotid bruit.     Trachea: No tracheal deviation.  Cardiovascular:     Rate and  Rhythm: Normal rate and regular rhythm.     Pulses: Normal pulses.     Heart sounds: Normal heart sounds. No murmur. No friction rub. No  gallop.   Pulmonary:     Effort: Pulmonary effort is normal. No respiratory distress.     Breath sounds: Normal breath sounds.  Abdominal:     General: Bowel sounds are normal. There is no distension.     Palpations: Abdomen is soft. There is no mass.     Tenderness: There is no abdominal tenderness. There is no guarding or rebound.     Hernia: No hernia is present.  Genitourinary:    Comments: No cva tenderness. Light brown stool - heme neg.  Musculoskeletal:        General: No swelling.     Comments: picc line RUE, no sign of infection to area.   Skin:    General: Skin is warm and dry.     Findings: No rash.  Neurological:     General: No focal deficit present.     Mental Status: She is alert.     Cranial Nerves: No cranial nerve deficit.     Comments: Alert, speech normal/fluent, no dysarthria or aphasia. Motor intact bil, stre 5/5. sens grossly intact. Steady gait.   Psychiatric:        Mood and Affect: Mood normal.      ED Treatments / Results  Labs (all labs ordered are listed, but only abnormal results are displayed) Results for orders placed or performed during the hospital encounter of 12/10/18  CBC  Result Value Ref Range   WBC 10.2 4.0 - 10.5 K/uL   RBC 2.31 (L) 3.87 - 5.11 MIL/uL   Hemoglobin 7.5 (L) 12.0 - 15.0 g/dL   HCT 23.6 (L) 36.0 - 46.0 %   MCV 102.2 (H) 80.0 - 100.0 fL   MCH 32.5 26.0 - 34.0 pg   MCHC 31.8 30.0 - 36.0 g/dL   RDW 15.9 (H) 11.5 - 15.5 %   Platelets 281 150 - 400 K/uL   nRBC 0.0 0.0 - 0.2 %  Basic metabolic panel  Result Value Ref Range   Sodium 138 135 - 145 mmol/L   Potassium 3.5 3.5 - 5.1 mmol/L   Chloride 103 98 - 111 mmol/L   CO2 24 22 - 32 mmol/L   Glucose, Bld 206 (H) 70 - 99 mg/dL   BUN 55 (H) 6 - 20 mg/dL   Creatinine, Ser 10.95 (H) 0.44 - 1.00 mg/dL   Calcium 8.4 (L) 8.9 - 10.3 mg/dL    GFR calc non Af Amer 3 (L) >60 mL/min   GFR calc Af Amer 4 (L) >60 mL/min   Anion gap 11 5 - 15  CBG monitoring, ED  Result Value Ref Range   Glucose-Capillary 193 (H) 70 - 99 mg/dL  POC occult blood, ED Provider will collect  Result Value Ref Range   Fecal Occult Bld NEGATIVE NEGATIVE    EKG EKG Interpretation  Date/Time:  Tuesday December 10 2018 05:40:57 EDT Ventricular Rate:  85 PR Interval:    QRS Duration: 86 QT Interval:  428 QTC Calculation: 509 R Axis:   57 Text Interpretation:  Sinus rhythm Borderline prolonged QT interval No significant change since last tracing Confirmed by Lajean Saver [phone]) on 12/10/2018 6:52:03 AM   Radiology No results found.  Procedures Procedures (including critical care time)  Medications Ordered in ED Medications - No data to display   Initial Impression / Assessment and Plan / ED Course  I have reviewed the triage vital signs and the nursing notes.  Pertinent labs & imaging results that were available during my care of  the patient were reviewed by me and considered in my medical decision making (see chart for details).  Iv ns. Continuous pulse ox and monitor. Labs and ecg.   Reviewed nursing notes and prior charts for additional history.   Po fluids, food.   Labs reviewed by me - hgb 7.5, lower than prior. Pt with hx iron def anemia, and is also hd pt. Pt unsure when last on iron therapy - she states she thinks possibly it was stopped when chemo started, but unsure. Pt denies any vaginal bleeding or other blood loss.   BP/vitals normal in ED. Pt feels improved. No faintness or dizziness. Ambulatory in ED.   Will discuss anemia with pts doctor/ Kid[facility], so they can address and f/u.   Given very low bp at home, and pt symptomatic when standing, bp soft in ED, and hgb lower than prior, will transfuse prbc.   Discussed pt with Dr Jimmy Footman - he agrees w 1 unit prbc transfusion, states pt can go to her hd after that today,  and he will f/u re anemia, make sure is getting iron therapy, recheck, etc.  [phone], signed out to Dr Lacinda Axon.        Final Clinical Impressions(s) / ED Diagnoses   Final diagnoses:  None    ED Discharge Orders    None          Lajean Saver, MD 12/10/18 [phone]

## 2018-12-10 NOTE — Discharge Instructions (Signed)
It was our pleasure to provide your ER care today - we hope that you feel better.  Go directly to your dialysis center today - Dr Deterding indicates they will dialyze you when you get there, even though you missed your normal time early this AM.  Follow up with your doctors regarding your anemia in the coming week - also discuss plan for iron therapy.   Return to ER if worse, new symptoms, fevers, trouble breathing, fainting, other concern.

## 2018-12-10 NOTE — ED Triage Notes (Signed)
Pt reports dizziness, low BP reading at home, and slurred speech at home lasting approx 15 minutes. Pt has normal speech at this time. Pt says dizziness has improved some, but still light headed. Pt was getting ready to go to dialysis when these symptoms occurred. Pt has PICC line to right upper arm and currently receiving chemo for breast CA.

## 2018-12-11 ENCOUNTER — Inpatient Hospital Stay (HOSPITAL_COMMUNITY): Payer: Medicare Other

## 2018-12-11 ENCOUNTER — Encounter (HOSPITAL_COMMUNITY): Payer: Self-pay

## 2018-12-11 ENCOUNTER — Other Ambulatory Visit (HOSPITAL_COMMUNITY): Payer: Medicare Other

## 2018-12-11 ENCOUNTER — Encounter (HOSPITAL_COMMUNITY): Payer: Self-pay | Admitting: Hematology

## 2018-12-11 ENCOUNTER — Inpatient Hospital Stay (HOSPITAL_BASED_OUTPATIENT_CLINIC_OR_DEPARTMENT_OTHER): Payer: Medicare Other | Admitting: Hematology

## 2018-12-11 VITALS — BP 139/80 | HR 79 | Temp 98.6°F | Resp 18 | Wt 166.4 lb

## 2018-12-11 DIAGNOSIS — Z170 Estrogen receptor positive status [ER+]: Secondary | ICD-10-CM

## 2018-12-11 DIAGNOSIS — C50211 Malignant neoplasm of upper-inner quadrant of right female breast: Secondary | ICD-10-CM | POA: Diagnosis not present

## 2018-12-11 DIAGNOSIS — Z7689 Persons encountering health services in other specified circumstances: Secondary | ICD-10-CM

## 2018-12-11 DIAGNOSIS — Z5111 Encounter for antineoplastic chemotherapy: Secondary | ICD-10-CM

## 2018-12-11 DIAGNOSIS — I129 Hypertensive chronic kidney disease with stage 1 through stage 4 chronic kidney disease, or unspecified chronic kidney disease: Secondary | ICD-10-CM | POA: Diagnosis not present

## 2018-12-11 DIAGNOSIS — G629 Polyneuropathy, unspecified: Secondary | ICD-10-CM

## 2018-12-11 DIAGNOSIS — N186 End stage renal disease: Secondary | ICD-10-CM | POA: Diagnosis not present

## 2018-12-11 DIAGNOSIS — Z992 Dependence on renal dialysis: Secondary | ICD-10-CM

## 2018-12-11 LAB — CBC WITH DIFFERENTIAL/PLATELET
Abs Immature Granulocytes: 0.06 10*3/uL (ref 0.00–0.07)
Basophils Absolute: 0.1 10*3/uL (ref 0.0–0.1)
Basophils Relative: 1 %
Eosinophils Absolute: 0.1 10*3/uL (ref 0.0–0.5)
Eosinophils Relative: 1 %
HCT: 30.5 % — ABNORMAL LOW (ref 36.0–46.0)
Hemoglobin: 10.1 g/dL — ABNORMAL LOW (ref 12.0–15.0)
Immature Granulocytes: 1 %
Lymphocytes Relative: 14 %
Lymphs Abs: 1.4 10*3/uL (ref 0.7–4.0)
MCH: 32.3 pg (ref 26.0–34.0)
MCHC: 33.1 g/dL (ref 30.0–36.0)
MCV: 97.4 fL (ref 80.0–100.0)
Monocytes Absolute: 0.9 10*3/uL (ref 0.1–1.0)
Monocytes Relative: 9 %
Neutro Abs: 7.3 10*3/uL (ref 1.7–7.7)
Neutrophils Relative %: 74 %
Platelets: 371 10*3/uL (ref 150–400)
RBC: 3.13 MIL/uL — ABNORMAL LOW (ref 3.87–5.11)
RDW: 17.2 % — ABNORMAL HIGH (ref 11.5–15.5)
WBC: 9.7 10*3/uL (ref 4.0–10.5)
nRBC: 0 % (ref 0.0–0.2)

## 2018-12-11 LAB — COMPREHENSIVE METABOLIC PANEL
ALT: 20 U/L (ref 0–44)
AST: 17 U/L (ref 15–41)
Albumin: 3.4 g/dL — ABNORMAL LOW (ref 3.5–5.0)
Alkaline Phosphatase: 122 U/L (ref 38–126)
Anion gap: 14 (ref 5–15)
BUN: 35 mg/dL — ABNORMAL HIGH (ref 6–20)
CO2: 26 mmol/L (ref 22–32)
Calcium: 8.7 mg/dL — ABNORMAL LOW (ref 8.9–10.3)
Chloride: 98 mmol/L (ref 98–111)
Creatinine, Ser: 7.06 mg/dL — ABNORMAL HIGH (ref 0.44–1.00)
GFR calc Af Amer: 7 mL/min — ABNORMAL LOW (ref >60)
GFR calc non Af Amer: 6 mL/min — ABNORMAL LOW (ref >60)
Glucose, Bld: 107 mg/dL — ABNORMAL HIGH (ref 70–99)
Potassium: 3.5 mmol/L (ref 3.5–5.1)
Sodium: 138 mmol/L (ref 135–145)
Total Bilirubin: 0.5 mg/dL (ref 0.3–1.2)
Total Protein: 7.2 g/dL (ref 6.5–8.1)

## 2018-12-11 LAB — TYPE AND SCREEN
ABO/RH(D): B POS
Antibody Screen: NEGATIVE
Unit division: 0

## 2018-12-11 LAB — BPAM RBC
Blood Product Expiration Date: 202005052359
ISSUE DATE / TIME: 202004280917
Unit Type and Rh: 5100

## 2018-12-11 MED ORDER — DOXORUBICIN HCL CHEMO IV INJECTION 2 MG/ML
60.0000 mg/m2 | Freq: Once | INTRAVENOUS | Status: AC
  Administered 2018-12-11: 12:00:00 108 mg via INTRAVENOUS
  Filled 2018-12-11: qty 54

## 2018-12-11 MED ORDER — SODIUM CHLORIDE 0.9 % IV SOLN
Freq: Once | INTRAVENOUS | Status: AC
  Administered 2018-12-11: 11:00:00 via INTRAVENOUS
  Filled 2018-12-11: qty 5

## 2018-12-11 MED ORDER — PALONOSETRON HCL INJECTION 0.25 MG/5ML
0.2500 mg | Freq: Once | INTRAVENOUS | Status: AC
  Administered 2018-12-11: 0.25 mg via INTRAVENOUS
  Filled 2018-12-11: qty 5

## 2018-12-11 MED ORDER — HEPARIN SOD (PORK) LOCK FLUSH 100 UNIT/ML IV SOLN
500.0000 [IU] | Freq: Once | INTRAVENOUS | Status: AC | PRN
  Administered 2018-12-11: 13:00:00 500 [IU]

## 2018-12-11 MED ORDER — SODIUM CHLORIDE 0.9% FLUSH
10.0000 mL | INTRAVENOUS | Status: DC | PRN
  Administered 2018-12-11: 10 mL
  Filled 2018-12-11: qty 10

## 2018-12-11 MED ORDER — SODIUM CHLORIDE 0.9 % IV SOLN
Freq: Once | INTRAVENOUS | Status: AC
  Administered 2018-12-11: 11:00:00 via INTRAVENOUS

## 2018-12-11 MED ORDER — PEGFILGRASTIM 6 MG/0.6ML ~~LOC~~ PSKT
6.0000 mg | PREFILLED_SYRINGE | Freq: Once | SUBCUTANEOUS | Status: AC
  Administered 2018-12-11: 6 mg via SUBCUTANEOUS
  Filled 2018-12-11: qty 0.6

## 2018-12-11 MED ORDER — SODIUM CHLORIDE 0.9 % IV SOLN
450.0000 mg/m2 | Freq: Once | INTRAVENOUS | Status: AC
  Administered 2018-12-11: 820 mg via INTRAVENOUS
  Filled 2018-12-11: qty 41

## 2018-12-11 NOTE — Patient Instructions (Signed)
[facility] Discharge Instructions for Patients Receiving Chemotherapy  Today you received the following chemotherapy agents   To help prevent nausea and vomiting after your treatment, we encourage you to take your nausea medication   If you develop nausea and vomiting that is not controlled by your nausea medication, call the clinic.   BELOW ARE SYMPTOMS THAT SHOULD BE REPORTED IMMEDIATELY:  *FEVER GREATER THAN 100.5 F  *CHILLS WITH OR WITHOUT FEVER  NAUSEA AND VOMITING THAT IS NOT CONTROLLED WITH YOUR NAUSEA MEDICATION  *UNUSUAL SHORTNESS OF BREATH  *UNUSUAL BRUISING OR BLEEDING  TENDERNESS IN MOUTH AND THROAT WITH OR WITHOUT PRESENCE OF ULCERS  *URINARY PROBLEMS  *BOWEL PROBLEMS  UNUSUAL RASH Items with * indicate a potential emergency and should be followed up as soon as possible.  Feel free to call the clinic should you have any questions or concerns. The clinic phone number is [phone].  Please show the CHEMO ALERT CARD at check-in to the Emergency Department and triage nurse.   

## 2018-12-11 NOTE — Progress Notes (Signed)
Labs reviewed by MD, proceed with treatment today per MD.   .Megan [name] arrived today for [facility] neulasta on body injector. See MAR for administration details. Injector in place and engaged with green light indicator on flashing. Tolerated application with out problems.  Treatment given per orders. Patient tolerated it well without problems. Vitals stable and discharged home from clinic ambulatory. Follow up as scheduled.

## 2018-12-11 NOTE — Assessment & Plan Note (Signed)
1.  Stage Ib (T1CN0) right breast cancer: -Screening mammogram on 02/18/2018 suggests right breast mass.  Right breast additional views on 02/26/2018 showed a 0.5 x 0.3 x 0.4 cm mass in the right breast.  Ultrasound biopsy was recommended. -Right breast biopsy on 03/05/2018 shows benign breast tissue with areas of dense hyalin fibrosis. -Right breast mammogram on 08/20/2018 shows suspicious mass in the 1 o'clock position of the right breast with no adenopathy. -Left breast mammogram on 08/27/2018 was BI-RADS Category 1. -Right breast biopsy on 08/27/2018 at 1:00 consistent with infiltrating ductal carcinoma. 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  ER 50% positive, PR negative, HER-2 negative, Ki 67-60%. - We discussed the pathology report in detail. - Today we discussed about the Oncotype DX test results.  Recurrence score is 47.  Distant recurrence risk at 9 years with tamoxifen alone is 36%.  Absolute chemotherapy benefit is more than 15% with her recurrent score. - RT-PCR analysis of ER expression of the specimen shows ER was negative.  However ER was 50% positive by IHC. - Because of high recurrence score on Oncotype DX, she was recommended adjuvant chemotherapy. -2 cycles of AC on 10/30/2018 and 11/20/2018. -We reviewed her blood work today.  It is adequate to proceed with cycle 3 without any dose modifications. -We will continue to hold off on erythropoiesis stimulating agents at this time. -We will see her back in 3 weeks for follow-up.  2.  ESRD on HD: -She has been hemodialysis for the past 1 and half year. -ESRD thought to be secondary to hypertension.  3.  Neuropathy: -She developed neuropathy in the past 8 months, etiology unknown.  She has burning sensation in the bottom of her both feet, predominantly in the toes.  No neuropathic symptoms in the hands. -She is currently taking  Cymbalta and gabapentin.

## 2018-12-11 NOTE — Patient Instructions (Signed)
[facility] at [facility] Discharge Instructions  You were seen today by Dr. Katragadda. He went over your recent lab results. He will see you back in 3 weeks for labs and follow up.   Thank you for choosing [facility] at [facility] to provide your oncology and hematology care.  To afford each patient quality time with our provider, please arrive at least 15 minutes before your scheduled appointment time.   If you have a lab appointment with the Cancer Center please come in thru the  Main Entrance and check in at the main information desk  You need to re-schedule your appointment should you arrive 10 or more minutes late.  We strive to give you quality time with our providers, and arriving late affects you and other patients whose appointments are after yours.  Also, if you no show three or more times for appointments you may be dismissed from the clinic at the providers discretion.     Again, thank you for choosing [facility].  Our hope is that these requests will decrease the amount of time that you wait before being seen by our physicians.       _____________________________________________________________  Should you have questions after your visit to [facility], please contact our office at [phone] between the hours of 8:00 a.m. and 4:30 p.m.  Voicemails left after 4:00 p.m. will not be returned until the following business day.  For prescription refill requests, have your pharmacy contact our office and allow 72 hours.    Cancer Center Support Programs:   > Cancer Support Group  2nd Tuesday of the month 1pm-2pm, Journey Room    

## 2018-12-11 NOTE — Progress Notes (Signed)
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[place])    09/26/2018 Cancer Staging    Staging form: Breast, AJCC 8th Edition - Clinical stage from 09/26/2018: Stage IB (cT1c, cN0, cM0, G3, ER+, PR-, HER2-) - Signed by Derek Jack, MD on 09/26/2018    10/30/2018 -  Chemotherapy    The patient had DOXOrubicin (ADRIAMYCIN) chemo injection 108 mg, 60 mg/m2 = 108 mg, Intravenous,  Once, 3 of 4 cycles Administration: 108 mg (10/30/2018), 108 mg (11/20/2018), 108 mg (12/11/2018) palonosetron (ALOXI) injection 0.25 mg, 0.25 mg, Intravenous,  Once, 3 of 4 cycles Administration: 0.25 mg (10/30/2018), 0.25 mg (11/20/2018), 0.25 mg (12/11/2018) pegfilgrastim (NEULASTA ONPRO KIT) injection 6 mg, 6 mg, Subcutaneous, Once, 3 of 4 cycles Administration: 6 mg (10/30/2018), 6 mg (11/20/2018), 6 mg (12/11/2018) cyclophosphamide (CYTOXAN) 820 mg in sodium chloride 0.9 % 250 mL chemo infusion, 450 mg/m2 = 820 mg (75 % of original dose 600 mg/m2), Intravenous,  Once, 3 of 4 cycles Dose modification: 450 mg/m2 (75 % of original dose 600 mg/m2, Cycle 1, Reason: Other (see comments), Comment: hemodialysis) Administration: 820 mg (10/30/2018), 820 mg (11/20/2018), 820 mg (12/11/2018) fosaprepitant (EMEND) 150 mg, dexamethasone (DECADRON) 12 mg in sodium chloride 0.9 % 145 mL IVPB, , Intravenous,  Once, 3 of 4 cycles Administration:  (10/30/2018),  (11/20/2018),  (12/11/2018)  for chemotherapy treatment.       CANCER STAGING: Cancer Staging Malignant neoplasm of upper-inner quadrant of right female breast [facility]) Staging form:  Breast, AJCC 8th Edition - Clinical stage from 09/26/2018: Stage IB (cT1c, cN0, cM0, G3, ER+, PR-, HER2-) - Signed by Derek Jack, MD on 09/26/2018    INTERVAL HISTORY:  Ms. Boggan 58 y.o. female returns for routine follow-up and consideration for next cycle of chemotherapy. She is here today alone. She states that she had a recent ER visit for decreased Blood Pressure. Denies any nausea, vomiting, or diarrhea. Denies any new pains. Had not noticed any recent bleeding such as epistaxis, hematuria or hematochezia. Denies recent chest pain on exertion, shortness of breath on minimal exertion, pre-syncopal episodes, or palpitations. Denies any numbness or tingling in hands or feet. Denies any recent fevers, infections, or recent hospitalizations. Patient reports appetite at 75% and energy level at 50%.     REVIEW OF SYSTEMS:  Review of Systems  All other systems reviewed and are negative.    PAST MEDICAL/SURGICAL HISTORY:  Past Medical History:  Diagnosis Date  . Anemia   . Blood transfusion without reported diagnosis   . Cataract   . CKD (chronic kidney disease) stage 3, GFR 30-59 ml/min (HCC)   . Essential hypertension, benign   . GERD (gastroesophageal reflux disease)   . Headache   . Hemorrhoids   . Mixed hyperlipidemia   . PONV (postoperative nausea and vomiting)   . Renal insufficiency   . S/P colonoscopy Jan 2011   Dr. Benson Norway: sessile polyp (benign lymphoid), large hemorrhoids, repeat 5-10 years  . Temporal arteritis ([place])   . Type 2 diabetes mellitus ([place])   . Wears glasses    Past  Surgical History:  Procedure Laterality Date  . ABDOMINAL HYSTERECTOMY    . APPENDECTOMY    . ARTERY BIOPSY N/A 05/09/2018   Procedure: RIGHT TEMPORAL ARTERY BIOPSY;  Surgeon: Judeth Horn, MD;  Location: [place];  Service: General;  Laterality: N/A;  . CATARACT EXTRACTION W/PHACO Left 02/09/2017   Procedure: CATARACT EXTRACTION PHACO AND INTRAOCULAR LENS PLACEMENT LEFT EYE;  Surgeon:  Tonny Branch, MD;  Location: [facility];  Service: Ophthalmology;  Laterality: Left;  CDE: 4.89  . CATARACT EXTRACTION W/PHACO Right 06/04/2017   Procedure: CATARACT EXTRACTION PHACO AND INTRAOCULAR LENS PLACEMENT (IOC);  Surgeon: Tonny Branch, MD;  Location: [facility];  Service: Ophthalmology;  Laterality: Right;  CDE: 4.12  . CHOLECYSTECTOMY  09/29/2011   Procedure: LAPAROSCOPIC CHOLECYSTECTOMY;  Surgeon: Jamesetta So, MD;  Location: [facility];  Service: General;  Laterality: N/A;  . COLONOSCOPY  Jan 2011   Dr. Benson Norway: sessile polyp (benign lymphoid), large hemorrhoids, repeat 5-10 years  . COLONOSCOPY N/A 06/12/2016   Procedure: COLONOSCOPY;  Surgeon: Daneil Dolin, MD;  [facility];  Service: Endoscopy;  Laterality: N/A;  1230   . ESOPHAGOGASTRODUODENOSCOPY  09/05/2011   WIO:XBDZH hiatal hernia; remainder of exam normal. No explanation for patient's abdominal pain with today's examination  . ESOPHAGOGASTRODUODENOSCOPY N/A 12/17/2013   Dr. [name]: gastric erythema, erosion, mild chronic inflammation on path   . LAPAROSCOPIC APPENDECTOMY  09/29/2011   Procedure: APPENDECTOMY LAPAROSCOPIC;  Surgeon: Jamesetta So, MD;  Location: [facility];  Service: General;;  incidental appendectomy  . PARTIAL MASTECTOMY WITH NEEDLE LOCALIZATION AND AXILLARY SENTINEL LYMPH NODE BX Right 09/18/2018   Procedure: RIGHT PARTIAL MASTECTOMY AFTER NEEDLE LOCALIZATION, SENTINEL LYMPH NODE BIOPSY RIGHT AXILLA;  Surgeon: Aviva Signs, MD;  Location: [facility];  Service: General;  Laterality: Right;     SOCIAL HISTORY:  Social History   Socioeconomic History  . Marital status: Married    Spouse name: Not on file  . Number of children: Not on file  . Years of education: Not on file  . Highest education level: Not on file  Occupational History  . Occupation: Merchandiser, retail: [employer] # [phone]  Social Needs  . Financial resource strain: Not hard at all  . Food insecurity:    Worry: Never true    Inability: Never  true  . Transportation needs:    Medical: No    Non-medical: No  Tobacco Use  . Smoking status: Never Smoker  . Smokeless tobacco: Never Used  Substance and Sexual Activity  . Alcohol use: No  . Drug use: No  . Sexual activity: Yes    Birth control/protection: Surgical  Lifestyle  . Physical activity:    Days per week: 0 days    Minutes per session: 0 min  . Stress: Not at all  Relationships  . Social connections:    Talks on phone: Twice a week    Gets together: Twice a week    Attends religious service: More than 4 times per year    Active member of club or organization: No    Attends meetings of clubs or organizations: Never    Relationship status: Married  . Intimate partner violence:    Fear of current or ex partner: No    Emotionally abused: No    Physically abused: No    Forced sexual activity: No  Other Topics Concern  . Not on file  Social History Narrative   Works at [employer] in [place].  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   Also has to get items from high shelves-causes achy pain in shoulder area      Married.   Children are grown, out of house.    FAMILY HISTORY:  Family History  Problem Relation Age of Onset  . Hypertension Mother   . Coronary artery disease Mother   . Diabetes Mother   . Hypertension Sister   . Coronary artery disease Sister   . Hypertension Brother   . Heart attack Father   . Hypertension Son   . Heart attack Maternal Aunt   . Hypertension Maternal Aunt   . Diabetes Maternal Aunt   . Heart attack Maternal Uncle   . Hypertension Maternal Uncle   . Diabetes Maternal Uncle   . Heart attack Paternal Aunt   . Hypertension Paternal Aunt   . Diabetes Paternal Aunt   . Heart attack Paternal Uncle   . Hypertension Paternal Uncle   . Diabetes Paternal Uncle   . Heart attack Maternal Grandmother   . Heart attack Maternal Grandfather   . Heart attack Paternal Grandmother   . Heart attack Paternal Grandfather    . Colon cancer Neg Hx     CURRENT MEDICATIONS:  Outpatient Encounter Medications as of 12/11/2018  Medication Sig  . amLODipine (NORVASC) 5 MG tablet Take 1 tablet (5 mg total) by mouth daily. (Patient taking differently: Take 5 mg by mouth daily as needed (blood pressure 159 or above). )  . aspirin EC 81 MG tablet Take 81 mg by mouth daily.  Marland Kitchen atorvastatin (LIPITOR) 20 MG tablet Take 1 tablet (20 mg total) by mouth daily. (Patient taking differently: Take 20 mg by mouth at bedtime. )  . B Complex-C-Folic Acid (DIALYVITE 563) 0.8 MG TABS Take 800 mg by mouth daily.   . butalbital-acetaminophen-caffeine (FIORICET, ESGIC) 50-325-40 MG tablet Take 1 tablet by mouth every 6 (six) hours as needed for headache.  . cinacalcet (SENSIPAR) 30 MG tablet Take 2 tablets (60 mg total) by mouth daily. (Patient taking differently: Take 30 mg by mouth daily. )  . DULoxetine (CYMBALTA) 30 MG capsule Take 1 capsule (30 mg total) by mouth daily. (Patient not taking: Reported on 12/10/2018)  . gabapentin (NEURONTIN) 300 MG capsule TAKE 1 CAPSULE(300 MG) BY MOUTH AT BEDTIME  . HYDROcodone-acetaminophen (NORCO) 5-325 MG tablet Take 1 tablet by mouth every 4 (four) hours as needed for moderate pain.  Marland Kitchen lanthanum (FOSRENOL) 1000 MG chewable tablet Chew 1,000-2,000 mg by mouth See admin instructions. Take 2000 mg by mouth twice daily with meals and 1000 with a snack  . lidocaine-prilocaine (EMLA) cream APPLY SMALL AMOUNT TO ACCESS SITE (AVF) 1 TO 2 HOURS BEFORE DIALYSIS. COVER WITH OCCLUSIVE DRESSING (SARAN WRAP)  . losartan (COZAAR) 50 MG tablet Take 1 tablet (50 mg total) by mouth daily. (Patient taking differently: Take 50 mg by mouth at bedtime. )  . nebivolol (BYSTOLIC) 10 MG tablet Take 1 tablet (10 mg total) by mouth 2 (two) times daily.  . ondansetron (ZOFRAN) 4 MG tablet Take 1 tablet (4 mg total) by mouth every 8 (eight) hours as needed for nausea or vomiting.  . pantoprazole (PROTONIX) 40 MG tablet TAKE 1  TABLET(40 MG) BY MOUTH DAILY (Patient taking differently: Take 40 mg by mouth daily before breakfast. )  . prochlorperazine (COMPAZINE) 10 MG tablet Take 1 tablet (10 mg total) by mouth every 6 (six) hours as needed (Nausea or vomiting).  Marland Kitchen tiZANidine (  ZANAFLEX) 4 MG capsule TAKE 1 CAPSULE(4 MG) BY MOUTH TWICE DAILY WITH A MEAL. START 1 CAPSULE AT NIGHT FOR 2 WEEKS AND THEN TWICE DAILY   No facility-administered encounter medications on file as of 12/11/2018.     ALLERGIES:  No Known Allergies   PHYSICAL EXAM:  ECOG Performance status: 1  Blood pressure is 158/70.  Pulse rate is 93.  Respirate is 18.  Temperature 98.6.  Oxygen saturation is 98. Physical Exam Vitals signs reviewed.  Constitutional:      Appearance: Normal appearance.  Cardiovascular:     Rate and Rhythm: Normal rate and regular rhythm.     Heart sounds: Normal heart sounds.  Pulmonary:     Effort: Pulmonary effort is normal.     Breath sounds: Normal breath sounds.  Abdominal:     General: There is no distension.     Palpations: Abdomen is soft. There is no mass.  Musculoskeletal:        General: No swelling.  Skin:    General: Skin is warm.  Neurological:     General: No focal deficit present.     Mental Status: She is alert and oriented to person, place, and time.  Psychiatric:        Mood and Affect: Mood normal.        Behavior: Behavior normal.      LABORATORY DATA:  I have reviewed the labs as listed.  CBC    Component Value Date/Time   WBC 9.7 12/11/2018 0844   RBC 3.13 (L) 12/11/2018 0844   HGB 10.1 (L) 12/11/2018 0844   HCT 30.5 (L) 12/11/2018 0844   PLT 371 12/11/2018 0844   MCV 97.4 12/11/2018 0844   MCH 32.3 12/11/2018 0844   MCHC 33.1 12/11/2018 0844   RDW 17.2 (H) 12/11/2018 0844   LYMPHSABS 1.4 12/11/2018 0844   MONOABS 0.9 12/11/2018 0844   EOSABS 0.1 12/11/2018 0844   BASOSABS 0.1 12/11/2018 0844   CMP Latest Ref Rng & Units 12/11/2018 12/10/2018 11/20/2018  Glucose 70 - 99  mg/dL 107(H) 206(H) 182(H)  BUN 6 - 20 mg/dL 35(H) 55(H) 53(H)  Creatinine 0.44 - 1.00 mg/dL 7.06(H) 10.95(H) 8.20(H)  Sodium 135 - 145 mmol/L 138 138 138  Potassium 3.5 - 5.1 mmol/L 3.5 3.5 2.9(L)  Chloride 98 - 111 mmol/L 98 103 98  CO2 22 - 32 mmol/L _0 Calcium 8.9 - 10.3 mg/dL 8.7(L) 8.4(L) 8.7(L)  Total Protein 6.5 - 8.1 g/dL 7.2 - 6.5  Total Bilirubin 0.3 - 1.2 mg/dL 0.5 - 0.5  Alkaline Phos 38 - 126 U/L 122 - 87  AST 15 - 41 U/L 17 - 16  ALT 0 - 44 U/L 20 - 16       DIAGNOSTIC IMAGING:  I have independently reviewed the scans and discussed with the patient.   I have reviewed Venita Lick LPN's note and agree with the documentation.  I personally performed a face-to-face visit, made revisions and my assessment and plan is as follows.    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  Stage Ib (T1CN0) right breast cancer: -Screening mammogram on 02/18/2018 suggests right breast mass.  Right breast additional views on 02/26/2018 showed a 0.5 x 0.3 x 0.4 cm mass in the right breast.  Ultrasound biopsy was recommended. -Right breast biopsy on 03/05/2018 shows benign breast tissue with areas of dense hyalin fibrosis. -Right breast mammogram on 08/20/2018 shows suspicious  mass in the 1 o'clock position of the right breast with no adenopathy. -Left breast mammogram on 08/27/2018 was BI-RADS Category 1. -Right breast biopsy on 08/27/2018 at 1:00 consistent with infiltrating ductal carcinoma. 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  ER 50% positive, PR negative, HER-2 negative, Ki 67-60%. - We discussed the pathology report in detail. - Today we discussed about the Oncotype DX test results.  Recurrence score is 47.  Distant recurrence risk at 9 years with tamoxifen alone is 36%.  Absolute chemotherapy benefit is more than  15% with her recurrent score. - RT-PCR analysis of ER expression of the specimen shows ER was negative.  However ER was 50% positive by IHC. - Because of high recurrence score on Oncotype DX, she was recommended adjuvant chemotherapy. -2 cycles of AC on 10/30/2018 and 11/20/2018. -We reviewed her blood work today.  It is adequate to proceed with cycle 3 without any dose modifications. -We will continue to hold off on erythropoiesis stimulating agents at this time. -We will see her back in 3 weeks for follow-up.  2.  ESRD on HD: -She has been hemodialysis for the past 1 and half year. -ESRD thought to be secondary to hypertension.  3.  Neuropathy: -She developed neuropathy in the past 8 months, etiology unknown.  She has burning sensation in the bottom of her both feet, predominantly in the toes.  No neuropathic symptoms in the hands. -She is currently taking Cymbalta and gabapentin.   Total time spent is 25 minutes with more than 50% of the time spent face-to-face discussing and reinforcing chemotherapy regimen, side effects and coordination of care.    Orders placed this encounter:  No orders of the defined types were placed in this encounter.     Derek Jack, MD [place] [phone]

## 2018-12-12 ENCOUNTER — Emergency Department (HOSPITAL_COMMUNITY)
Admission: EM | Admit: 2018-12-12 | Discharge: 2018-12-12 | Disposition: A | Discharge status: Home / Self Care | Payer: Medicare Other | Attending: Emergency Medicine | Admitting: Emergency Medicine

## 2018-12-12 ENCOUNTER — Other Ambulatory Visit: Payer: Self-pay

## 2018-12-12 ENCOUNTER — Encounter (HOSPITAL_COMMUNITY): Payer: Self-pay | Admitting: Emergency Medicine

## 2018-12-12 DIAGNOSIS — E876 Hypokalemia: Secondary | ICD-10-CM | POA: Diagnosis not present

## 2018-12-12 DIAGNOSIS — I959 Hypotension, unspecified: Secondary | ICD-10-CM | POA: Diagnosis not present

## 2018-12-12 DIAGNOSIS — N2581 Secondary hyperparathyroidism of renal origin: Secondary | ICD-10-CM | POA: Diagnosis not present

## 2018-12-12 DIAGNOSIS — E1122 Type 2 diabetes mellitus with diabetic chronic kidney disease: Secondary | ICD-10-CM | POA: Diagnosis not present

## 2018-12-12 DIAGNOSIS — D649 Anemia, unspecified: Secondary | ICD-10-CM | POA: Diagnosis not present

## 2018-12-12 DIAGNOSIS — I120 Hypertensive chronic kidney disease with stage 5 chronic kidney disease or end stage renal disease: Secondary | ICD-10-CM | POA: Insufficient documentation

## 2018-12-12 DIAGNOSIS — N186 End stage renal disease: Secondary | ICD-10-CM | POA: Insufficient documentation

## 2018-12-12 DIAGNOSIS — Z992 Dependence on renal dialysis: Secondary | ICD-10-CM | POA: Diagnosis not present

## 2018-12-12 DIAGNOSIS — Z7982 Long term (current) use of aspirin: Secondary | ICD-10-CM | POA: Insufficient documentation

## 2018-12-12 DIAGNOSIS — R531 Weakness: Secondary | ICD-10-CM | POA: Diagnosis not present

## 2018-12-12 DIAGNOSIS — Z79899 Other long term (current) drug therapy: Secondary | ICD-10-CM | POA: Diagnosis not present

## 2018-12-12 DIAGNOSIS — E119 Type 2 diabetes mellitus without complications: Secondary | ICD-10-CM | POA: Diagnosis not present

## 2018-12-12 DIAGNOSIS — E1129 Type 2 diabetes mellitus with other diabetic kidney complication: Secondary | ICD-10-CM | POA: Diagnosis not present

## 2018-12-12 LAB — CBC
HCT: 28.1 % — ABNORMAL LOW (ref 36.0–46.0)
Hemoglobin: 9.3 g/dL — ABNORMAL LOW (ref 12.0–15.0)
MCH: 32.1 pg (ref 26.0–34.0)
MCHC: 33.1 g/dL (ref 30.0–36.0)
MCV: 96.9 fL (ref 80.0–100.0)
Platelets: 356 10*3/uL (ref 150–400)
RBC: 2.9 MIL/uL — ABNORMAL LOW (ref 3.87–5.11)
RDW: 16.9 % — ABNORMAL HIGH (ref 11.5–15.5)
WBC: 16.8 10*3/uL — ABNORMAL HIGH (ref 4.0–10.5)
nRBC: 0 % (ref 0.0–0.2)

## 2018-12-12 LAB — BASIC METABOLIC PANEL
Anion gap: 13 (ref 5–15)
BUN: 27 mg/dL — ABNORMAL HIGH (ref 6–20)
CO2: 27 mmol/L (ref 22–32)
Calcium: 8.9 mg/dL (ref 8.9–10.3)
Chloride: 96 mmol/L — ABNORMAL LOW (ref 98–111)
Creatinine, Ser: 4.59 mg/dL — ABNORMAL HIGH (ref 0.44–1.00)
GFR calc Af Amer: 12 mL/min — ABNORMAL LOW (ref >60)
GFR calc non Af Amer: 10 mL/min — ABNORMAL LOW (ref >60)
Glucose, Bld: 149 mg/dL — ABNORMAL HIGH (ref 70–99)
Potassium: 3.8 mmol/L (ref 3.5–5.1)
Sodium: 136 mmol/L (ref 135–145)

## 2018-12-12 MED ORDER — SODIUM CHLORIDE 0.9 % IV BOLUS
500.0000 mL | Freq: Once | INTRAVENOUS | Status: AC
  Administered 2018-12-12: 500 mL via INTRAVENOUS

## 2018-12-12 NOTE — ED Provider Notes (Signed)
[facility] EMERGENCY DEPARTMENT Provider Note   CSN: [id number] Arrival date & time: 12/12/18  1203    History   Chief Complaint Chief Complaint  Patient presents with  . Weakness    HPI Megan [name] is a 58 y.o. female.     HPI Patient with near syncope/syncope after dialysis.  Has had episodes previously where after dialysis she gets dizzy.  States she went to hold dialysis.  Her dry weight is 74 kg states they brought her down to 73.  States she went to the store after it and felt dizzy.  No chest pain.  No trouble breathing.  She has breast cancer and received chemotherapy yesterday.  States she does not feel bad yet from chemotherapy.  However she states she does have decreased energy and decreased appetite.  She did eat some breakfast but does not eat any lunch yet today. Past Medical History:  Diagnosis Date  . Anemia   . Blood transfusion without reported diagnosis   . Cataract   . CKD (chronic kidney disease) stage 3, GFR 30-59 ml/min (HCC)   . Essential hypertension, benign   . GERD (gastroesophageal reflux disease)   . Headache   . Hemorrhoids   . Mixed hyperlipidemia   . PONV (postoperative nausea and vomiting)   . Renal insufficiency   . S/P colonoscopy Jan 2011   Dr. Benson Norway: sessile polyp (benign lymphoid), large hemorrhoids, repeat 5-10 years  . Temporal arteritis ([place])   . Type 2 diabetes mellitus ([place])   . Wears glasses     Patient Active Problem List   Diagnosis Date Noted  . 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 Rectal bleeding 06/08/2016  . Vaginal discharge 12/30/2015  . Abdominal pain, epigastric 11/26/2013  . GERD (gastroesophageal reflux disease)   . Type 2 diabetes mellitus ([place])   . CKD (chronic kidney disease) stage 3, GFR 30-59 ml/min (HCC)   . External hemorrhoid 08/01/2011  . Hematochezia 07/13/2011  . Atypical chest pain 04/21/2010  . Mixed hyperlipidemia 03/11/2008  . OBESITY 03/11/2008   . Essential hypertension, benign 03/11/2008    Past Surgical History:  Procedure Laterality Date  . ABDOMINAL HYSTERECTOMY    . APPENDECTOMY    . ARTERY BIOPSY N/A 05/09/2018   Procedure: RIGHT TEMPORAL ARTERY BIOPSY;  Surgeon: Judeth Horn, MD;  Location: [facility];  Service: General;  Laterality: N/A;  . CATARACT EXTRACTION W/PHACO Left 02/09/2017   Procedure: CATARACT EXTRACTION PHACO AND INTRAOCULAR LENS PLACEMENT LEFT EYE;  Surgeon: Tonny Branch, MD;  Location: [facility];  Service: Ophthalmology;  Laterality: Left;  CDE: 4.89  . CATARACT EXTRACTION W/PHACO Right 06/04/2017   Procedure: CATARACT EXTRACTION PHACO AND INTRAOCULAR LENS PLACEMENT (IOC);  Surgeon: Tonny Branch, MD;  Location: [facility];  Service: Ophthalmology;  Laterality: Right;  CDE: 4.12  . CHOLECYSTECTOMY  09/29/2011   Procedure: LAPAROSCOPIC CHOLECYSTECTOMY;  Surgeon: Jamesetta So, MD;  Location: [facility];  Service: General;  Laterality: N/A;  . COLONOSCOPY  Jan 2011   Dr. Benson Norway: sessile polyp (benign lymphoid), large hemorrhoids, repeat 5-10 years  . COLONOSCOPY N/A 06/12/2016   Procedure: COLONOSCOPY;  Surgeon: Daneil Dolin, MD;  [facility];  Service: Endoscopy;  Laterality: N/A;  1230   . ESOPHAGOGASTRODUODENOSCOPY  09/05/2011   QAS:UORVI hiatal hernia; remainder of exam normal. No explanation for patient's abdominal pain with today's examination  . ESOPHAGOGASTRODUODENOSCOPY N/A 12/17/2013   Dr. [name]: gastric erythema, erosion, mild chronic inflammation  on path   . LAPAROSCOPIC APPENDECTOMY  09/29/2011   Procedure: APPENDECTOMY LAPAROSCOPIC;  Surgeon: Jamesetta So, MD;  Location: [facility];  Service: General;;  incidental appendectomy  . PARTIAL MASTECTOMY WITH NEEDLE LOCALIZATION AND AXILLARY SENTINEL LYMPH NODE BX Right 09/18/2018   Procedure: RIGHT PARTIAL MASTECTOMY AFTER NEEDLE LOCALIZATION, SENTINEL LYMPH NODE BIOPSY RIGHT AXILLA;  Surgeon: Aviva Signs, MD;  Location: [facility];  Service: General;  Laterality:  Right;     OB History   No obstetric history on file.      Home Medications    Prior to Admission medications   Medication Sig Start Date End Date Taking? Authorizing Provider  amLODipine (NORVASC) 5 MG tablet Take 1 tablet (5 mg total) by mouth daily. Patient taking differently: Take 5 mg by mouth daily as needed (blood pressure 159 or above).  08/19/18   [name], MD  aspirin EC 81 MG tablet Take 81 mg by mouth daily.    [provider]  atorvastatin (LIPITOR) 20 MG tablet Take 1 tablet (20 mg total) by mouth daily. Patient taking differently: Take 20 mg by mouth at bedtime.  08/16/18   [name], MD  B Complex-C-Folic Acid (DIALYVITE 301) 0.8 MG TABS Take 800 mg by mouth daily.  01/22/18   [provider]  butalbital-acetaminophen-caffeine (FIORICET, ESGIC) 50-325-40 MG tablet Take 1 tablet by mouth every 6 (six) hours as needed for headache. 06/04/18 06/04/19  [name], MD  cinacalcet (SENSIPAR) 30 MG tablet Take 2 tablets (60 mg total) by mouth daily. Patient taking differently: Take 30 mg by mouth daily.  06/13/18   [name], MD  DULoxetine (CYMBALTA) 30 MG capsule Take 1 capsule (30 mg total) by mouth daily. Patient not taking: Reported on 12/10/2018 10/03/18   [name], MD  gabapentin (NEURONTIN) 300 MG capsule TAKE 1 CAPSULE(300 MG) BY MOUTH AT BEDTIME 09/16/18   [name], MD  HYDROcodone-acetaminophen (NORCO) 5-325 MG tablet Take 1 tablet by mouth every 4 (four) hours as needed for moderate pain. 09/18/18   Aviva Signs, MD  lanthanum (FOSRENOL) 1000 MG chewable tablet Chew 1,000-2,000 mg by mouth See admin instructions. Take 2000 mg by mouth twice daily with meals and 1000 with a snack 07/01/18   [provider]  lidocaine-prilocaine (EMLA) cream APPLY SMALL AMOUNT TO ACCESS SITE (AVF) 1 TO 2 HOURS BEFORE DIALYSIS. COVER WITH OCCLUSIVE DRESSING ([place]) 09/24/18   [provider]  losartan  (COZAAR) 50 MG tablet Take 1 tablet (50 mg total) by mouth daily. Patient taking differently: Take 50 mg by mouth at bedtime.  03/28/18   [name], MD  nebivolol (BYSTOLIC) 10 MG tablet Take 1 tablet (10 mg total) by mouth 2 (two) times daily. 06/13/18   [name], MD  ondansetron (ZOFRAN) 4 MG tablet Take 1 tablet (4 mg total) by mouth every 8 (eight) hours as needed for nausea or vomiting. 09/18/18   Aviva Signs, MD  pantoprazole (PROTONIX) 40 MG tablet TAKE 1 TABLET(40 MG) BY MOUTH DAILY Patient taking differently: Take 40 mg by mouth daily before breakfast.  08/19/18   [name], MD  prochlorperazine (COMPAZINE) 10 MG tablet Take 1 tablet (10 mg total) by mouth every 6 (six) hours as needed (Nausea or vomiting). 10/29/18   Derek Jack, MD  tiZANidine (ZANAFLEX) 4 MG capsule TAKE 1 CAPSULE(4 MG) BY MOUTH TWICE DAILY WITH A MEAL. START 1 CAPSULE AT NIGHT FOR 2 WEEKS AND  THEN TWICE DAILY 09/16/18   Garvin Fila, MD    Family History Family History  Problem Relation Age of Onset  . Hypertension Mother   . Coronary artery disease Mother   . Diabetes Mother   . Hypertension Sister   . Coronary artery disease Sister   . Hypertension Brother   . Heart attack Father   . Hypertension Son   . Heart attack Maternal Aunt   . Hypertension Maternal Aunt   . Diabetes Maternal Aunt   . Heart attack Maternal Uncle   . Hypertension Maternal Uncle   . Diabetes Maternal Uncle   . Heart attack Paternal Aunt   . Hypertension Paternal Aunt   . Diabetes Paternal Aunt   . Heart attack Paternal Uncle   . Hypertension Paternal Uncle   . Diabetes Paternal Uncle   . Heart attack Maternal Grandmother   . Heart attack Maternal Grandfather   . Heart attack Paternal Grandmother   . Heart attack Paternal Grandfather   . Colon cancer Neg Hx     Social History Social History   Tobacco Use  . Smoking status: Never Smoker  . Smokeless tobacco: Never Used  Substance Use  Topics  . Alcohol use: No  . Drug use: No     Allergies   Patient has no known allergies.   Review of Systems Review of Systems  Constitutional: Positive for appetite change and fatigue. Negative for fever.  HENT: Negative for congestion.   Respiratory: Negative for shortness of breath.   Cardiovascular: Negative for chest pain.  Gastrointestinal: Negative for abdominal pain.  Genitourinary: Negative for flank pain.  Musculoskeletal: Negative for back pain.  Skin: Negative for rash.  Hematological: Negative for adenopathy.  Psychiatric/Behavioral: Negative for confusion.     Physical Exam Updated Vital Signs BP 112/73   Pulse 67   Temp (!) 97.5 F (36.4 C) (Oral)   Resp 16   Ht 5\' 2"  (1.575 m)   Wt 75.3 kg   SpO2 100%   BMI 30.36 kg/m   Physical Exam Vitals signs and nursing note reviewed.  HENT:     Head: Normocephalic.  Eyes:     Extraocular Movements: Extraocular movements intact.  Cardiovascular:     Rate and Rhythm: Normal rate and regular rhythm.  Pulmonary:     Breath sounds: No wheezing, rhonchi or rales.  Abdominal:     Tenderness: There is no abdominal tenderness.  Musculoskeletal:     Right lower leg: No edema.     Left lower leg: No edema.  Skin:    General: Skin is warm.     Capillary Refill: Capillary refill takes less than 2 seconds.  Neurological:     Mental Status: She is alert. Mental status is at baseline.      ED Treatments / Results  Labs (all labs ordered are listed, but only abnormal results are displayed) Labs Reviewed  BASIC METABOLIC PANEL - Abnormal; Notable for the following components:      Result Value   Chloride 96 (*)    Glucose, Bld 149 (*)    BUN 27 (*)    Creatinine, Ser 4.59 (*)    GFR calc non Af Amer 10 (*)    GFR calc Af Amer 12 (*)    All other components within normal limits  CBC - Abnormal; Notable for the following components:   WBC 16.8 (*)    RBC 2.90 (*)    Hemoglobin 9.3 (*)  HCT 28.1 (*)     RDW 16.9 (*)    All other components within normal limits    EKG EKG Interpretation  Date/Time:  Thursday December 12 2018 12:11:06 EDT Ventricular Rate:  78 PR Interval:    QRS Duration: 83 QT Interval:  450 QTC Calculation: 513 R Axis:   33 Text Interpretation:  Sinus rhythm Probable left atrial enlargement Prolonged QT interval Baseline wander in lead(s) V5 No significant change since last tracing Confirmed by Davonna Belling [phone]) on 12/12/2018 2:00:24 PM   Radiology No results found.  Procedures Procedures (including critical care time)  Medications Ordered in ED Medications  sodium chloride 0.9 % bolus 500 mL (0 mLs Intravenous Stopped 12/12/18 1346)     Initial Impression / Assessment and Plan / ED Course  I have reviewed the triage vital signs and the nursing notes.  Pertinent labs & imaging results that were available during my care of the patient were reviewed by me and considered in my medical decision making (see chart for details).        Patient with hypotension postdialysis.  Feels better after 500 cc bolus.  Labs overall reassuring although hemoglobin has come down to 9.3 although was recently 10.1 after transfusion up from 7.5.  Will need following as an outpatient.  Will need coordination of PCP nephrology and oncology.  Discussed with patient's husband also.  Will discharge home  Final Clinical Impressions(s) / ED Diagnoses   Final diagnoses:  Hypotension, unspecified hypotension type  End stage renal disease on dialysis ([place])  Anemia, unspecified type    ED Discharge Orders    None       Davonna Belling, MD 12/12/18 1520

## 2018-12-12 NOTE — ED Notes (Signed)
[place] MD notified about patient's BP.

## 2018-12-12 NOTE — Discharge Instructions (Signed)
Follow-up with your nephrologist your oncologist and primary care doctor to find out the best course to help stop these episodes of low blood pressure.

## 2018-12-12 NOTE — ED Notes (Signed)
Given water upon request.

## 2018-12-12 NOTE — ED Notes (Signed)
Tolerated ambulating in room well.  BP 112/73.

## 2018-12-12 NOTE — ED Triage Notes (Signed)
Pt just got out of dialysis today and has felt weak ever since.  Went to [facility] and called EMS due to weakness

## 2018-12-13 ENCOUNTER — Encounter (HOSPITAL_COMMUNITY): Payer: Medicare Other

## 2018-12-13 DIAGNOSIS — Z992 Dependence on renal dialysis: Secondary | ICD-10-CM | POA: Diagnosis not present

## 2018-12-13 DIAGNOSIS — N040 Nephrotic syndrome with minor glomerular abnormality: Secondary | ICD-10-CM | POA: Diagnosis not present

## 2018-12-13 DIAGNOSIS — N186 End stage renal disease: Secondary | ICD-10-CM | POA: Diagnosis not present

## 2018-12-14 DIAGNOSIS — E1129 Type 2 diabetes mellitus with other diabetic kidney complication: Secondary | ICD-10-CM | POA: Diagnosis not present

## 2018-12-14 DIAGNOSIS — D631 Anemia in chronic kidney disease: Secondary | ICD-10-CM | POA: Diagnosis not present

## 2018-12-14 DIAGNOSIS — N186 End stage renal disease: Secondary | ICD-10-CM | POA: Diagnosis not present

## 2018-12-14 DIAGNOSIS — N2581 Secondary hyperparathyroidism of renal origin: Secondary | ICD-10-CM | POA: Diagnosis not present

## 2018-12-14 DIAGNOSIS — E119 Type 2 diabetes mellitus without complications: Secondary | ICD-10-CM | POA: Diagnosis not present

## 2018-12-16 ENCOUNTER — Other Ambulatory Visit: Payer: Self-pay

## 2018-12-16 ENCOUNTER — Inpatient Hospital Stay (HOSPITAL_BASED_OUTPATIENT_CLINIC_OR_DEPARTMENT_OTHER): Payer: Medicare Other | Admitting: Nurse Practitioner

## 2018-12-16 ENCOUNTER — Encounter (HOSPITAL_COMMUNITY): Payer: Self-pay | Admitting: Nurse Practitioner

## 2018-12-16 ENCOUNTER — Inpatient Hospital Stay (HOSPITAL_COMMUNITY): Payer: Medicare Other | Attending: Hematology

## 2018-12-16 DIAGNOSIS — E1122 Type 2 diabetes mellitus with diabetic chronic kidney disease: Secondary | ICD-10-CM | POA: Diagnosis not present

## 2018-12-16 DIAGNOSIS — I120 Hypertensive chronic kidney disease with stage 5 chronic kidney disease or end stage renal disease: Secondary | ICD-10-CM | POA: Diagnosis not present

## 2018-12-16 DIAGNOSIS — Z79899 Other long term (current) drug therapy: Secondary | ICD-10-CM | POA: Diagnosis not present

## 2018-12-16 DIAGNOSIS — I959 Hypotension, unspecified: Secondary | ICD-10-CM | POA: Insufficient documentation

## 2018-12-16 DIAGNOSIS — Z992 Dependence on renal dialysis: Secondary | ICD-10-CM | POA: Insufficient documentation

## 2018-12-16 DIAGNOSIS — Z7982 Long term (current) use of aspirin: Secondary | ICD-10-CM | POA: Insufficient documentation

## 2018-12-16 DIAGNOSIS — K219 Gastro-esophageal reflux disease without esophagitis: Secondary | ICD-10-CM | POA: Diagnosis not present

## 2018-12-16 DIAGNOSIS — N186 End stage renal disease: Secondary | ICD-10-CM | POA: Diagnosis not present

## 2018-12-16 DIAGNOSIS — D649 Anemia, unspecified: Secondary | ICD-10-CM

## 2018-12-16 DIAGNOSIS — Z170 Estrogen receptor positive status [ER+]: Secondary | ICD-10-CM | POA: Diagnosis not present

## 2018-12-16 DIAGNOSIS — Z5111 Encounter for antineoplastic chemotherapy: Secondary | ICD-10-CM | POA: Insufficient documentation

## 2018-12-16 DIAGNOSIS — Z9221 Personal history of antineoplastic chemotherapy: Secondary | ICD-10-CM

## 2018-12-16 DIAGNOSIS — C50211 Malignant neoplasm of upper-inner quadrant of right female breast: Secondary | ICD-10-CM | POA: Diagnosis not present

## 2018-12-16 DIAGNOSIS — E785 Hyperlipidemia, unspecified: Secondary | ICD-10-CM | POA: Insufficient documentation

## 2018-12-16 NOTE — Assessment & Plan Note (Signed)
1.  Stage Ib right breast cancer: - She had a screening mammogram on 02/18/2018 that suggested a right breast mass. - Diagnostic mammogram on 02/26/2018 showed a 0.5 x 0.3 x 0.4 cm mass in the right breast.  Ultrasound biopsy was recommended. -Right breast biopsy on 03/05/2018 showed benign breast tissue with areas of dense hyalin fibrosis. -Right breast mammogram on 08/20/2018 showed suspicious mass in the 1 o'clock position of the right breast with no adenopathy. -Left breast mammogram on 08/27/2018 showed B RADS category 1. -Right breast mammogram on 08/27/2018 at 1:00 consistent with infiltrating ductal carcinoma. 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  ER 50% positive, PR negative, and HER-2 negative.  Ki-67 was 60%. -Oncotype DX testing results recurrence score at 47.  Distant recurrence risk at 9 years with tamoxifen alone is 36%.  Absolute chemotherapy benefit is more than 15% with her recurrence score. - Because of her high risk score she started [facility] on 10/30/2018. - She just completed her third cycle on 12/11/2018. -She reports she is feeling well no nausea vomiting diarrhea.  Skin and nails are WNL.  - She was seen back today due to her low blood pressure.  She is been sent to the hospital on numerous occasions for her blood pressure being low.  This is mostly on her dialysis days. -Have recommended she check her blood pressure twice a day prior to taking her blood pressure medicine.  If her blood pressure is less than 120/80 do not take her medicine.  She is mainly having problems on her dialysis evenings.  I have recommended her not take her Bystolic evening dose of medication. - She will continue to take her blood pressure twice a day and follow-up with 1 week with the readings.  2.  ESRD on HD: - She has been on hemodialysis for the past 1 and 1/2 years. - ESRD thought to be  secondary to hypertension. - She has been having episodes of low blood pressure on her dialysis days and evenings.

## 2018-12-16 NOTE — Progress Notes (Signed)
Megan [name], [place] [address]   CLINIC:  Medical Oncology/Hematology  PCP:  Megan Frizzle, [name] [address] [place] [address] [phone]   REASON FOR VISIT: Follow-up for breast cancer and episodes of hypotension  CURRENT THERAPY: AC every 3 weeks   BRIEF ONCOLOGIC HISTORY:    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[place])   09/18/2018 Initial Diagnosis    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[place])    09/26/2018 Cancer Staging    Staging form: Breast, AJCC 8th Edition - Clinical stage from 09/26/2018: Stage IB (cT1c, cN0, cM0, G3, ER+, PR-, HER2-) - Signed by Megan [name] on 09/26/2018    10/30/2018 -  Chemotherapy    The patient had DOXOrubicin (ADRIAMYCIN) chemo injection 108 mg, 60 mg/m2 = 108 mg, Intravenous,  Once, 3 of 4 cycles Administration: 108 mg (10/30/2018), 108 mg (11/20/2018), 108 mg (12/11/2018) palonosetron (ALOXI) injection 0.25 mg, 0.25 mg, Intravenous,  Once, 3 of 4 cycles Administration: 0.25 mg (10/30/2018), 0.25 mg (11/20/2018), 0.25 mg (12/11/2018) pegfilgrastim (NEULASTA ONPRO KIT) injection 6 mg, 6 mg, Subcutaneous, Once, 3 of 4 cycles Administration: 6 mg (10/30/2018), 6 mg (11/20/2018), 6 mg (12/11/2018) cyclophosphamide (CYTOXAN) 820 mg in sodium chloride 0.9 % 250 mL chemo infusion, 450 mg/m2 = 820 mg (75 % of original dose 600 mg/m2), Intravenous,  Once, 3 of 4 cycles Dose modification: 450 mg/m2 (75 % of original dose 600 mg/m2, Cycle 1, Reason: Other (see comments), Comment: hemodialysis) Administration: 820 mg (10/30/2018), 820 mg (11/20/2018), 820 mg (12/11/2018) fosaprepitant (EMEND) 150 mg, dexamethasone (DECADRON) 12 mg in sodium chloride 0.9 % 145 mL IVPB, , Intravenous,  Once, 3 of 4 cycles Administration:  (10/30/2018),  (11/20/2018),  (12/11/2018)  for chemotherapy treatment.       CANCER STAGING: Cancer Staging Malignant neoplasm of  upper-inner quadrant of right female breast [facility]) Staging form: Breast, AJCC 8th Edition - Clinical stage from 09/26/2018: Stage IB (cT1c, cN0, cM0, G3, ER+, PR-, HER2-) - Signed by Megan [name] on 09/26/2018    INTERVAL HISTORY:  [name] 58 y.o. female returns for routine follow-up for episodes of hypotension.  She has had several episodes of hypotension that she has been sent to the emergency room for.  She reports is mostly on her dialysis days that her blood pressure drops.  She will start checking her blood pressure twice a day and write down the numbers and bring it back to [place] in a week.  She was told not to take blood pressure medicine if her pressures below 120/80.  Otherwise she feels great and has no other complaints at this time. Denies any nausea, vomiting, or diarrhea. Denies any new pains. Had not noticed any recent bleeding such as epistaxis, hematuria or hematochezia. Denies recent chest pain on exertion, shortness of breath on minimal exertion, pre-syncopal episodes, or palpitations. Denies any numbness or tingling in hands or feet. Denies any recent fevers, infections, or recent hospitalizations. Patient reports appetite at 100% and energy level at 75%.  He is eating well and maintaining her weight at this time.    REVIEW OF SYSTEMS:  Review of Systems  All other systems reviewed and are negative.    PAST MEDICAL/SURGICAL HISTORY:  Past Medical History:  Diagnosis Date   Anemia    Blood transfusion without reported diagnosis    Cataract    CKD (chronic kidney disease) stage 3, GFR  30-59 ml/min (HCC)    Essential hypertension, benign    GERD (gastroesophageal reflux disease)    Headache    Hemorrhoids    Mixed hyperlipidemia    PONV (postoperative nausea and vomiting)    Renal insufficiency    S/P colonoscopy Jan 2011   Dr. Benson Norway: sessile polyp (benign lymphoid), large hemorrhoids, repeat 5-10 years   Temporal arteritis ([place])    Type 2  diabetes mellitus ([place])    Wears glasses    Past Surgical History:  Procedure Laterality Date   ABDOMINAL HYSTERECTOMY     APPENDECTOMY     ARTERY BIOPSY N/A 05/09/2018   Procedure: RIGHT TEMPORAL ARTERY BIOPSY;  Surgeon: Judeth Horn, [name];  Location: [place];  Service: General;  Laterality: N/A;   CATARACT EXTRACTION W/PHACO Left 02/09/2017   Procedure: CATARACT EXTRACTION PHACO AND INTRAOCULAR LENS PLACEMENT LEFT EYE;  Surgeon: Tonny Branch, [name];  Location: [facility];  Service: Ophthalmology;  Laterality: Left;  CDE: 4.89   CATARACT EXTRACTION W/PHACO Right 06/04/2017   Procedure: CATARACT EXTRACTION PHACO AND INTRAOCULAR LENS PLACEMENT (IOC);  Surgeon: Tonny Branch, [name];  Location: [facility];  Service: Ophthalmology;  Laterality: Right;  CDE: 4.12   CHOLECYSTECTOMY  09/29/2011   Procedure: LAPAROSCOPIC CHOLECYSTECTOMY;  Surgeon: Jamesetta So, [name];  Location: [facility];  Service: General;  Laterality: N/A;   COLONOSCOPY  Jan 2011   Dr. Benson Norway: sessile polyp (benign lymphoid), large hemorrhoids, repeat 5-10 years   COLONOSCOPY N/A 06/12/2016   Procedure: COLONOSCOPY;  Surgeon: Daneil Dolin, [name];  [facility];  Service: Endoscopy;  Laterality: N/A;  1230    ESOPHAGOGASTRODUODENOSCOPY  09/05/2011   WTU:UEKCM hiatal hernia; remainder of exam normal. No explanation for patient's abdominal pain with today's examination   ESOPHAGOGASTRODUODENOSCOPY N/A 12/17/2013   Dr. [name]: gastric erythema, erosion, mild chronic inflammation on path    LAPAROSCOPIC APPENDECTOMY  09/29/2011   Procedure: APPENDECTOMY LAPAROSCOPIC;  Surgeon: Jamesetta So, [name];  Location: [facility];  Service: General;;  incidental appendectomy   PARTIAL MASTECTOMY WITH NEEDLE LOCALIZATION AND AXILLARY SENTINEL LYMPH NODE BX Right 09/18/2018   Procedure: RIGHT PARTIAL MASTECTOMY AFTER NEEDLE LOCALIZATION, SENTINEL LYMPH NODE BIOPSY RIGHT AXILLA;  Surgeon: Aviva Signs, [name];  Location: [facility];  Service: General;  Laterality: Right;       SOCIAL HISTORY:  Social History   Socioeconomic History   Marital status: Married    Spouse name: Not on file   Number of children: Not on file   Years of education: Not on file   Highest education level: Not on file  Occupational History   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[employer] [place] resource strain: Not hard at all   Food insecurity:    Worry: Never true    Inability: Never true   Transportation needs:    Medical: No    Non-medical: No  Tobacco Use   Smoking status: Never Smoker   Smokeless tobacco: Never Used  Substance and Sexual Activity   Alcohol use: No   Drug use: No   Sexual activity: Yes    Birth control/protection: Surgical  Lifestyle   Physical activity:    Days per week: 0 days    Minutes per session: 0 min   Stress: Not at all  Relationships   Social connections:    Talks on phone: Twice a week    Gets together: Twice a week    Attends religious service: More than  4 times per year    Active member of club or organization: No    Attends meetings of clubs or organizations: Never    Relationship status: Married   Intimate partner violence:    Fear of current or ex partner: No    Emotionally abused: No    Physically abused: No    Forced sexual activity: No  Other Topics Concern   Not on file  Social History Narrative   Works at [employer] in [place].    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   Also has to get items from high shelves-causes achy pain in shoulder area      Married.   Children are grown, out of house.    FAMILY HISTORY:  Family History  Problem Relation Age of Onset   Hypertension Mother    Coronary artery disease Mother    Diabetes Mother    Hypertension Sister    Coronary artery disease Sister    Hypertension Brother    Heart attack Father    Hypertension Son    Heart attack Maternal Aunt    Hypertension Maternal Aunt    Diabetes Maternal  Aunt    Heart attack Maternal Uncle    Hypertension Maternal Uncle    Diabetes Maternal Uncle    Heart attack Paternal Aunt    Hypertension Paternal Aunt    Diabetes Paternal Aunt    Heart attack Paternal Uncle    Hypertension Paternal Uncle    Diabetes Paternal Uncle    Heart attack Maternal Grandmother    Heart attack Maternal Grandfather    Heart attack Paternal Grandmother    Heart attack Paternal Grandfather    Colon cancer Neg Hx     CURRENT MEDICATIONS:  Outpatient Encounter Medications as of 12/16/2018  Medication Sig   amLODipine (NORVASC) 5 MG tablet Take 1 tablet (5 mg total) by mouth daily. (Patient taking differently: Take 5 mg by mouth daily as needed (blood pressure 159 or above). )   aspirin EC 81 MG tablet Take 81 mg by mouth daily.   atorvastatin (LIPITOR) 20 MG tablet Take 1 tablet (20 mg total) by mouth daily. (Patient taking differently: Take 20 mg by mouth at bedtime. )   B Complex-C-Folic Acid (DIALYVITE 468) 0.8 MG TABS Take 800 mg by mouth daily.    butalbital-acetaminophen-caffeine (FIORICET, ESGIC) 50-325-40 MG tablet Take 1 tablet by mouth every 6 (six) hours as needed for headache.   cinacalcet (SENSIPAR) 30 MG tablet Take 2 tablets (60 mg total) by mouth daily. (Patient taking differently: Take 30 mg by mouth daily. )   gabapentin (NEURONTIN) 300 MG capsule TAKE 1 CAPSULE(300 MG) BY MOUTH AT BEDTIME   HYDROcodone-acetaminophen (NORCO) 5-325 MG tablet Take 1 tablet by mouth every 4 (four) hours as needed for moderate pain.   lanthanum (FOSRENOL) 1000 MG chewable tablet Chew 1,000-2,000 mg by mouth See admin instructions. Take 2000 mg by mouth twice daily with meals and 1000 with a snack   lidocaine-prilocaine (EMLA) cream APPLY SMALL AMOUNT TO ACCESS SITE (AVF) 1 TO 2 HOURS BEFORE DIALYSIS. COVER WITH OCCLUSIVE DRESSING (SARAN WRAP)   losartan (COZAAR) 50 MG tablet Take 1 tablet (50 mg total) by mouth daily. (Patient taking  differently: Take 50 mg by mouth at bedtime. )   nebivolol (BYSTOLIC) 10 MG tablet Take 1 tablet (10 mg total) by mouth 2 (two) times daily.   ondansetron (ZOFRAN) 4 MG tablet Take 1 tablet (4 mg  total) by mouth every 8 (eight) hours as needed for nausea or vomiting.   pantoprazole (PROTONIX) 40 MG tablet TAKE 1 TABLET(40 MG) BY MOUTH DAILY (Patient taking differently: Take 40 mg by mouth daily before breakfast. )   prochlorperazine (COMPAZINE) 10 MG tablet Take 1 tablet (10 mg total) by mouth every 6 (six) hours as needed (Nausea or vomiting).   tiZANidine (ZANAFLEX) 4 MG capsule TAKE 1 CAPSULE(4 MG) BY MOUTH TWICE DAILY WITH A MEAL. START 1 CAPSULE AT NIGHT FOR 2 WEEKS AND THEN TWICE DAILY   DULoxetine (CYMBALTA) 30 MG capsule Take 1 capsule (30 mg total) by mouth daily. (Patient not taking: Reported on 12/10/2018)   No facility-administered encounter medications on file as of 12/16/2018.     ALLERGIES:  No Known Allergies   PHYSICAL EXAM:  ECOG Performance status: 1  Vitals:   12/16/18 1344  BP: (!) 163/89  Pulse: 91  Resp: 20  Temp: 98.7 F (37.1 C)  SpO2: 96%   Filed Weights   12/16/18 1344  Weight: 167 lb 12.8 oz (76.1 kg)    Physical Exam Constitutional:      Appearance: Normal appearance. She is normal weight.  Cardiovascular:     Rate and Rhythm: Normal rate and regular rhythm.     Heart sounds: Normal heart sounds.  Pulmonary:     Effort: Pulmonary effort is normal.     Breath sounds: Normal breath sounds.  Abdominal:     General: Bowel sounds are normal.     Palpations: Abdomen is soft.  Musculoskeletal: Normal range of motion.  Skin:    General: Skin is warm and dry.  Neurological:     Mental Status: She is alert and oriented to person, place, and time. Mental status is at baseline.  Psychiatric:        Mood and Affect: Mood normal.        Behavior: Behavior normal.        Thought Content: Thought content normal.        Judgment: Judgment normal.       LABORATORY DATA:  I have reviewed the labs as listed.  CBC    Component Value Date/Time   WBC 16.8 (H) 12/12/2018 1258   RBC 2.90 (L) 12/12/2018 1258   HGB 9.3 (L) 12/12/2018 1258   HCT 28.1 (L) 12/12/2018 1258   PLT 356 12/12/2018 1258   MCV 96.9 12/12/2018 1258   MCH 32.1 12/12/2018 1258   MCHC 33.1 12/12/2018 1258   RDW 16.9 (H) 12/12/2018 1258   LYMPHSABS 1.4 12/11/2018 0844   MONOABS 0.9 12/11/2018 0844   EOSABS 0.1 12/11/2018 0844   BASOSABS 0.1 12/11/2018 0844   CMP Latest Ref Rng & Units 12/12/2018 12/11/2018 12/10/2018  Glucose 70 - 99 mg/dL 149(H) 107(H) 206(H)  BUN 6 - 20 mg/dL 27(H) 35(H) 55(H)  Creatinine 0.44 - 1.00 mg/dL 4.59(H) 7.06(H) 10.95(H)  Sodium 135 - 145 mmol/L 136 138 138  Potassium 3.5 - 5.1 mmol/L 3.8 3.5 3.5  Chloride 98 - 111 mmol/L 96(L) 98 103  CO2 22 - 32 mmol/L 27 26 24   Calcium 8.9 - 10.3 mg/dL 8.9 8.7(L) 8.4(L)  Total Protein 6.5 - 8.1 g/dL - 7.2 -  Total Bilirubin 0.3 - 1.2 mg/dL - 0.5 -  Alkaline Phos 38 - 126 U/L - 122 -  AST 15 - 41 U/L - 17 -  ALT 0 - 44 U/L - 20 -     I personally performed a face-to-face visit.  All questions were answered to patient's stated satisfaction. Encouraged patient to call with any new concerns or questions before his next visit to the cancer center and we can certain see him sooner, if needed.     ASSESSMENT & PLAN:   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[place]) 1.  Stage Ib right breast cancer: - She had a screening mammogram on 02/18/2018 that suggested a right breast mass. - Diagnostic mammogram on 02/26/2018 showed a 0.5 x 0.3 x 0.4 cm mass in the right breast.  Ultrasound biopsy was recommended. -Right breast biopsy on 03/05/2018 showed benign breast tissue with areas of dense hyalin fibrosis. -Right breast mammogram on 08/20/2018 showed suspicious mass in the 1 o'clock position of the right breast with no adenopathy. -Left breast mammogram on 08/27/2018 showed B RADS  category 1. -Right breast mammogram on 08/27/2018 at 1:00 consistent with infiltrating ductal carcinoma. 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  ER 50% positive, PR negative, and HER-2 negative.  Ki-67 was 60%. -Oncotype DX testing results recurrence score at 47.  Distant recurrence risk at 9 years with tamoxifen alone is 36%.  Absolute chemotherapy benefit is more than 15% with her recurrence score. - Because of her high risk score she started [facility] on 10/30/2018. - She just completed her third cycle on 12/11/2018. -She reports she is feeling well no nausea vomiting diarrhea.  Skin and nails are WNL.  - She was seen back today due to her low blood pressure.  She is been sent to the hospital on numerous occasions for her blood pressure being low.  This is mostly on her dialysis days. -Have recommended she check her blood pressure twice a day prior to taking her blood pressure medicine.  If her blood pressure is less than 120/80 do not take her medicine.  She is mainly having problems on her dialysis evenings.  I have recommended her not take her Bystolic evening dose of medication. - She will continue to take her blood pressure twice a day and follow-up with 1 week with the readings.  2.  ESRD on HD: - She has been on hemodialysis for the past 1 and 1/2 years. - ESRD thought to be secondary to hypertension. - She has been having episodes of low blood pressure on her dialysis days and evenings.        Orders placed this encounter:  No orders of the defined types were placed in this encounter.     Francene Finders, FNP-C [place] [phone]

## 2018-12-16 NOTE — Patient Instructions (Signed)
[place] at [facility] Discharge Instructions  Follow up in 1 week Please check blood pressures twice a day prior to taking your blood pressure medication.    Thank you for choosing [place] at [facility] to provide your oncology and hematology care.  To afford each patient quality time with our provider, please arrive at least 15 minutes before your scheduled appointment time.   If you have a lab appointment with the [facility] please come in thru the  Main Entrance and check in at the main information desk  You need to re-schedule your appointment should you arrive 10 or more minutes late.  We strive to give you quality time with our providers, and arriving late affects you and other patients whose appointments are after yours.  Also, if you no show three or more times for appointments you may be dismissed from the clinic at the providers discretion.     Again, thank you for choosing [facility].  Our hope is that these requests will decrease the amount of time that you wait before being seen by our physicians.       _____________________________________________________________  Should you have questions after your visit to [facility], please contact our office at [phone] between the hours of 8:00 a.m. and 4:30 p.m.  Voicemails left after 4:00 p.m. will not be returned until the following business day.  For prescription refill requests, have your pharmacy contact our office and allow 72 hours.    Cancer Center Support Programs:   > Cancer Support Group  2nd Tuesday of the month 1pm-2pm, Journey Room

## 2018-12-16 NOTE — Progress Notes (Signed)
Megan [name] presented for PICC line flush. PICC line located right upper arm . Good blood return present. PICC line flushed with 67ml NS and 300U/62ml Heparin. Procedure without incident. Patient tolerated procedure well.

## 2018-12-17 DIAGNOSIS — N2581 Secondary hyperparathyroidism of renal origin: Secondary | ICD-10-CM | POA: Diagnosis not present

## 2018-12-17 DIAGNOSIS — E1129 Type 2 diabetes mellitus with other diabetic kidney complication: Secondary | ICD-10-CM | POA: Diagnosis not present

## 2018-12-17 DIAGNOSIS — N186 End stage renal disease: Secondary | ICD-10-CM | POA: Diagnosis not present

## 2018-12-17 DIAGNOSIS — E119 Type 2 diabetes mellitus without complications: Secondary | ICD-10-CM | POA: Diagnosis not present

## 2018-12-17 DIAGNOSIS — D631 Anemia in chronic kidney disease: Secondary | ICD-10-CM | POA: Diagnosis not present

## 2018-12-19 DIAGNOSIS — E119 Type 2 diabetes mellitus without complications: Secondary | ICD-10-CM | POA: Diagnosis not present

## 2018-12-19 DIAGNOSIS — N2581 Secondary hyperparathyroidism of renal origin: Secondary | ICD-10-CM | POA: Diagnosis not present

## 2018-12-19 DIAGNOSIS — N186 End stage renal disease: Secondary | ICD-10-CM | POA: Diagnosis not present

## 2018-12-19 DIAGNOSIS — E1129 Type 2 diabetes mellitus with other diabetic kidney complication: Secondary | ICD-10-CM | POA: Diagnosis not present

## 2018-12-19 DIAGNOSIS — D631 Anemia in chronic kidney disease: Secondary | ICD-10-CM | POA: Diagnosis not present

## 2018-12-20 ENCOUNTER — Inpatient Hospital Stay (HOSPITAL_COMMUNITY): Payer: Medicare Other

## 2018-12-20 ENCOUNTER — Other Ambulatory Visit: Payer: Self-pay

## 2018-12-20 DIAGNOSIS — Z5111 Encounter for antineoplastic chemotherapy: Secondary | ICD-10-CM | POA: Diagnosis not present

## 2018-12-20 DIAGNOSIS — D649 Anemia, unspecified: Secondary | ICD-10-CM | POA: Diagnosis not present

## 2018-12-20 DIAGNOSIS — K219 Gastro-esophageal reflux disease without esophagitis: Secondary | ICD-10-CM | POA: Diagnosis not present

## 2018-12-20 DIAGNOSIS — C50211 Malignant neoplasm of upper-inner quadrant of right female breast: Secondary | ICD-10-CM | POA: Diagnosis not present

## 2018-12-20 DIAGNOSIS — Z170 Estrogen receptor positive status [ER+]: Secondary | ICD-10-CM | POA: Diagnosis not present

## 2018-12-20 DIAGNOSIS — I959 Hypotension, unspecified: Secondary | ICD-10-CM | POA: Diagnosis not present

## 2018-12-20 MED ORDER — SODIUM CHLORIDE 0.9% FLUSH
10.0000 mL | Freq: Once | INTRAVENOUS | Status: AC
  Administered 2018-12-20: 10 mL

## 2018-12-20 MED ORDER — HEPARIN SOD (PORK) LOCK FLUSH 100 UNIT/ML IV SOLN
500.0000 [IU] | Freq: Once | INTRAVENOUS | Status: AC
  Administered 2018-12-20: 500 [IU] via INTRAVENOUS

## 2018-12-20 NOTE — Progress Notes (Signed)
Pt presents today for PICC line flush. VSS. Pt has no complaints of any changes since the last visit. Pt requests Dr. Delton Coombes to notify her Dialysis doctor in [place], Dr. Marvetta Gibbons. Message sent to [facility] LPN.    No complaints at this time. Discharged from clinic ambulatory. F/U with [facility] as scheduled.

## 2018-12-21 DIAGNOSIS — E119 Type 2 diabetes mellitus without complications: Secondary | ICD-10-CM | POA: Diagnosis not present

## 2018-12-21 DIAGNOSIS — N2581 Secondary hyperparathyroidism of renal origin: Secondary | ICD-10-CM | POA: Diagnosis not present

## 2018-12-21 DIAGNOSIS — N186 End stage renal disease: Secondary | ICD-10-CM | POA: Diagnosis not present

## 2018-12-21 DIAGNOSIS — D631 Anemia in chronic kidney disease: Secondary | ICD-10-CM | POA: Diagnosis not present

## 2018-12-21 DIAGNOSIS — E1129 Type 2 diabetes mellitus with other diabetic kidney complication: Secondary | ICD-10-CM | POA: Diagnosis not present

## 2018-12-23 ENCOUNTER — Inpatient Hospital Stay (HOSPITAL_COMMUNITY): Payer: Medicare Other

## 2018-12-23 ENCOUNTER — Other Ambulatory Visit: Payer: Self-pay

## 2018-12-23 ENCOUNTER — Inpatient Hospital Stay (HOSPITAL_COMMUNITY): Payer: Medicare Other | Attending: Nurse Practitioner | Admitting: Nurse Practitioner

## 2018-12-23 DIAGNOSIS — Z170 Estrogen receptor positive status [ER+]: Secondary | ICD-10-CM | POA: Diagnosis not present

## 2018-12-23 DIAGNOSIS — Z5111 Encounter for antineoplastic chemotherapy: Secondary | ICD-10-CM | POA: Diagnosis not present

## 2018-12-23 DIAGNOSIS — I959 Hypotension, unspecified: Secondary | ICD-10-CM | POA: Diagnosis not present

## 2018-12-23 DIAGNOSIS — C50211 Malignant neoplasm of upper-inner quadrant of right female breast: Secondary | ICD-10-CM | POA: Insufficient documentation

## 2018-12-23 DIAGNOSIS — K219 Gastro-esophageal reflux disease without esophagitis: Secondary | ICD-10-CM | POA: Diagnosis not present

## 2018-12-23 DIAGNOSIS — D649 Anemia, unspecified: Secondary | ICD-10-CM | POA: Diagnosis not present

## 2018-12-23 MED ORDER — SODIUM CHLORIDE 0.9% FLUSH
10.0000 mL | Freq: Once | INTRAVENOUS | Status: AC
  Administered 2018-12-23: 10 mL

## 2018-12-23 MED ORDER — HEPARIN SOD (PORK) LOCK FLUSH 100 UNIT/ML IV SOLN
500.0000 [IU] | Freq: Once | INTRAVENOUS | Status: AC
  Administered 2018-12-23: 500 [IU] via INTRAVENOUS

## 2018-12-23 NOTE — Progress Notes (Signed)
Megan [name] presented for PICC line flush with dressing change.  PICC line  located in the right arm. PICC lumen flushed with 44ml NS and 250 Units of  Heparin per protocol . Procedure without incident. Patient tolerated procedure well.

## 2018-12-23 NOTE — Assessment & Plan Note (Addendum)
1.  Stage Ib right breast cancer: - She had a screening mammogram on 02/18/2018 that suggested a right breast mass. - Diagnostic mammogram on 02/26/2018 showed a 0.5 x 0.3 x 0.4 cm mass in the right breast.  Ultrasound biopsy was recommended. -Right breast biopsy on 03/05/2018 showed benign breast tissue with areas of dense hyalin fibrosis. -Right breast mammogram on 08/20/2018 showed suspicious mass in the 1 o'clock position of the right breast with no adenopathy. -Left breast mammogram on 08/27/2018 showed B RADS category 1. -Right breast mammogram on 08/27/2018 at 1:00 consistent with infiltrating ductal carcinoma. 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  ER 50% positive, PR negative, and HER-2 negative.  Ki-67 was 60%. -Oncotype DX testing results recurrence score at 47.  Distant recurrence risk at 9 years with tamoxifen alone is 36%.  Absolute chemotherapy benefit is more than 15% with her recurrence score. - Because of her high risk score she started [facility] on 10/30/2018. - She just completed her third cycle on 12/11/2018. -She reports she is feeling well no nausea vomiting diarrhea.  Skin and nails are WNL.  - She was seen back today on 12/16/2018 due to her low blood pressure.  She is been sent to the hospital on numerous occasions for her blood pressure being low.  This is mostly on her dialysis days. -On 12/16/2018 I recommended she check her blood pressure twice a day prior to taking her blood pressure medicine.  If her blood pressure is less than 120/80 do not take her medicine.  She is mainly having problems on her dialysis evenings.  I have recommended her not take her Bystolic evening dose of medication. -I saw her back today on 12/23/2018 she brought me a list of her blood pressures over the last week.  She reports they have been creeping up higher.  She also has had no lightheadedness  or blackouts this past week.  I recommended that she take her blood pressure medicine as prescribed previously.  If she has any more spells of blacking out to call the office.  I also told her do not take her blood pressure medicine if her blood pressure is less than 120/80.  She is done much better this week.  She will follow-up next Wednesday with labs and treatment and office visit with Dr. Delton Coombes. - She will continue to take her blood pressure twice a day and follow-up with 1 week with the readings.  2.  ESRD on HD: - She has been on hemodialysis for the past 1 and 1/2 years. - ESRD thought to be secondary to hypertension. - She has been having episodes of low blood pressure on her dialysis days and evenings.

## 2018-12-23 NOTE — Patient Instructions (Addendum)
[place] at [facility] Discharge Instructions  Follow-up next Wednesday with labs treatment and a visit with Dr. Delton Coombes. Continue taking your blood pressure twice a day and record results.   Thank you for choosing [place] at [facility] to provide your oncology and hematology care.  To afford each patient quality time with our provider, please arrive at least 15 minutes before your scheduled appointment time.   If you have a lab appointment with the [facility] please come in thru the  Main Entrance and check in at the main information desk  You need to re-schedule your appointment should you arrive 10 or more minutes late.  We strive to give you quality time with our providers, and arriving late affects you and other patients whose appointments are after yours.  Also, if you no show three or more times for appointments you may be dismissed from the clinic at the providers discretion.     Again, thank you for choosing [facility].  Our hope is that these requests will decrease the amount of time that you wait before being seen by our physicians.       _____________________________________________________________  Should you have questions after your visit to [facility], please contact our office at [phone] between the hours of 8:00 a.m. and 4:30 p.m.  Voicemails left after 4:00 p.m. will not be returned until the following business day.  For prescription refill requests, have your pharmacy contact our office and allow 72 hours.    Cancer Center Support Programs:   > Cancer Support Group  2nd Tuesday of the month 1pm-2pm, Journey Room

## 2018-12-23 NOTE — Progress Notes (Signed)
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[place])    09/26/2018 Cancer Staging    Staging form: Breast, AJCC 8th Edition - Clinical stage from 09/26/2018: Stage IB (cT1c, cN0, cM0, G3, ER+, PR-, HER2-) - Signed by Derek Jack, MD on 09/26/2018    10/30/2018 -  Chemotherapy    The patient had DOXOrubicin (ADRIAMYCIN) chemo injection 108 mg, 60 mg/m2 = 108 mg, Intravenous,  Once, 3 of 4 cycles Administration: 108 mg (10/30/2018), 108 mg (11/20/2018), 108 mg (12/11/2018) palonosetron (ALOXI) injection 0.25 mg, 0.25 mg, Intravenous,  Once, 3 of 4 cycles Administration: 0.25 mg (10/30/2018), 0.25 mg (11/20/2018), 0.25 mg (12/11/2018) pegfilgrastim (NEULASTA ONPRO KIT) injection 6 mg, 6 mg, Subcutaneous, Once, 3 of 4 cycles Administration: 6 mg (10/30/2018), 6 mg (11/20/2018), 6 mg (12/11/2018) cyclophosphamide (CYTOXAN) 820 mg in sodium chloride 0.9 % 250 mL chemo infusion, 450 mg/m2 = 820 mg (75 % of original dose 600 mg/m2), Intravenous,  Once, 3 of 4 cycles Dose modification: 450 mg/m2 (75 % of original dose 600 mg/m2, Cycle 1, Reason: Other (see comments), Comment: hemodialysis) Administration: 820 mg (10/30/2018), 820 mg (11/20/2018), 820 mg (12/11/2018) fosaprepitant (EMEND) 150 mg, dexamethasone (DECADRON) 12 mg in sodium chloride 0.9 % 145 mL IVPB, , Intravenous,  Once, 3 of 4 cycles Administration:  (10/30/2018),  (11/20/2018),  (12/11/2018)  for chemotherapy treatment.       CANCER STAGING: Cancer Staging Malignant neoplasm of upper-inner quadrant of right female breast  [facility]) Staging form: Breast, AJCC 8th Edition - Clinical stage from 09/26/2018: Stage IB (cT1c, cN0, cM0, G3, ER+, PR-, HER2-) - Signed by Derek Jack, MD on 09/26/2018    INTERVAL HISTORY:  Megan [name] 58 y.o. female returns for routine follow-up for breast cancer.  Patient reports that this week went a lot better and she had no episodes of blacking out or dizziness.  Her blood pressure has actually been running higher instead of low now.  She has no other complaints besides occasional headaches in the morning. Denies any nausea, vomiting, or diarrhea. Denies any new pains. Had not noticed any recent bleeding such as epistaxis, hematuria or hematochezia. Denies recent chest pain on exertion, shortness of breath on minimal exertion, pre-syncopal episodes, or palpitations. Denies any numbness or tingling in hands or feet. Denies any recent fevers, infections, or recent hospitalizations. Patient reports appetite at 50% and energy level at 50%.  She is eating well and maintaining her weight at this time.    REVIEW OF SYSTEMS:  Review of Systems  Neurological: Positive for headaches.  All other systems reviewed and are negative.    PAST MEDICAL/SURGICAL HISTORY:  Past Medical History:  Diagnosis Date  . Anemia   . Blood transfusion without reported diagnosis   . Cataract   . CKD (chronic kidney disease) stage 3, GFR 30-59 ml/min (HCC)   . Essential hypertension, benign   . GERD (gastroesophageal reflux disease)   . Headache   . Hemorrhoids   . Mixed hyperlipidemia   . PONV (postoperative nausea and vomiting)   . Renal insufficiency   .  S/P colonoscopy Jan 2011   Dr. Benson Norway: sessile polyp (benign lymphoid), large hemorrhoids, repeat 5-10 years  . Temporal arteritis ([place])   . Type 2 diabetes mellitus ([place])   . Wears glasses    Past Surgical History:  Procedure Laterality Date  . ABDOMINAL HYSTERECTOMY    . APPENDECTOMY    . ARTERY BIOPSY N/A 05/09/2018   Procedure: RIGHT  TEMPORAL ARTERY BIOPSY;  Surgeon: Judeth Horn, MD;  Location: [facility];  Service: General;  Laterality: N/A;  . CATARACT EXTRACTION W/PHACO Left 02/09/2017   Procedure: CATARACT EXTRACTION PHACO AND INTRAOCULAR LENS PLACEMENT LEFT EYE;  Surgeon: Tonny Branch, MD;  Location: [facility];  Service: Ophthalmology;  Laterality: Left;  CDE: 4.89  . CATARACT EXTRACTION W/PHACO Right 06/04/2017   Procedure: CATARACT EXTRACTION PHACO AND INTRAOCULAR LENS PLACEMENT (IOC);  Surgeon: Tonny Branch, MD;  Location: [facility];  Service: Ophthalmology;  Laterality: Right;  CDE: 4.12  . CHOLECYSTECTOMY  09/29/2011   Procedure: LAPAROSCOPIC CHOLECYSTECTOMY;  Surgeon: Jamesetta So, MD;  Location: [facility];  Service: General;  Laterality: N/A;  . COLONOSCOPY  Jan 2011   Dr. Benson Norway: sessile polyp (benign lymphoid), large hemorrhoids, repeat 5-10 years  . COLONOSCOPY N/A 06/12/2016   Procedure: COLONOSCOPY;  Surgeon: Daneil Dolin, MD;  [facility];  Service: Endoscopy;  Laterality: N/A;  1230   . ESOPHAGOGASTRODUODENOSCOPY  09/05/2011   ZOX:WRUEA hiatal hernia; remainder of exam normal. No explanation for patient's abdominal pain with today's examination  . ESOPHAGOGASTRODUODENOSCOPY N/A 12/17/2013   Dr. [name]: gastric erythema, erosion, mild chronic inflammation on path   . LAPAROSCOPIC APPENDECTOMY  09/29/2011   Procedure: APPENDECTOMY LAPAROSCOPIC;  Surgeon: Jamesetta So, MD;  Location: [facility];  Service: General;;  incidental appendectomy  . PARTIAL MASTECTOMY WITH NEEDLE LOCALIZATION AND AXILLARY SENTINEL LYMPH NODE BX Right 09/18/2018   Procedure: RIGHT PARTIAL MASTECTOMY AFTER NEEDLE LOCALIZATION, SENTINEL LYMPH NODE BIOPSY RIGHT AXILLA;  Surgeon: Aviva Signs, MD;  Location: [facility];  Service: General;  Laterality: Right;     SOCIAL HISTORY:  Social History   Socioeconomic History  . Marital status: Married    Spouse name: Not on file  . Number of children: Not on file  . Years of education: Not on file  .  Highest education level: Not on file  Occupational History  . Occupation: Merchandiser, retail: [employer] [phone]  Social Needs  . Financial resource strain: Not hard at all  . Food insecurity:    Worry: Never true    Inability: Never true  . Transportation needs:    Medical: No    Non-medical: No  Tobacco Use  . Smoking status: Never Smoker  . Smokeless tobacco: Never Used  Substance and Sexual Activity  . Alcohol use: No  . Drug use: No  . Sexual activity: Yes    Birth control/protection: Surgical  Lifestyle  . Physical activity:    Days per week: 0 days    Minutes per session: 0 min  . Stress: Not at all  Relationships  . Social connections:    Talks on phone: Twice a week    Gets together: Twice a week    Attends religious service: More than 4 times per year    Active member of club or organization: No    Attends meetings of clubs or organizations: Never    Relationship status: Married  . Intimate partner violence:    Fear of current or ex partner: No  Emotionally abused: No    Physically abused: No    Forced sexual activity: No  Other Topics Concern  . Not on file  Social History Narrative   Works at [employer] in [place].    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   Also has to get items from high shelves-causes achy pain in shoulder area      Married.   Children are grown, out of house.    FAMILY HISTORY:  Family History  Problem Relation Age of Onset  . Hypertension Mother   . Coronary artery disease Mother   . Diabetes Mother   . Hypertension Sister   . Coronary artery disease Sister   . Hypertension Brother   . Heart attack Father   . Hypertension Son   . Heart attack Maternal Aunt   . Hypertension Maternal Aunt   . Diabetes Maternal Aunt   . Heart attack Maternal Uncle   . Hypertension Maternal Uncle   . Diabetes Maternal Uncle   . Heart attack Paternal Aunt   . Hypertension Paternal Aunt   . Diabetes Paternal Aunt   .  Heart attack Paternal Uncle   . Hypertension Paternal Uncle   . Diabetes Paternal Uncle   . Heart attack Maternal Grandmother   . Heart attack Maternal Grandfather   . Heart attack Paternal Grandmother   . Heart attack Paternal Grandfather   . Colon cancer Neg Hx     CURRENT MEDICATIONS:  Outpatient Encounter Medications as of 12/23/2018  Medication Sig  . amLODipine (NORVASC) 5 MG tablet Take 1 tablet (5 mg total) by mouth daily. (Patient taking differently: Take 5 mg by mouth daily as needed (blood pressure 159 or above). )  . aspirin EC 81 MG tablet Take 81 mg by mouth daily.  Marland Kitchen atorvastatin (LIPITOR) 20 MG tablet Take 1 tablet (20 mg total) by mouth daily. (Patient taking differently: Take 20 mg by mouth at bedtime. )  . B Complex-C-Folic Acid (DIALYVITE 175) 0.8 MG TABS Take 800 mg by mouth daily.   . butalbital-acetaminophen-caffeine (FIORICET, ESGIC) 50-325-40 MG tablet Take 1 tablet by mouth every 6 (six) hours as needed for headache.  . cinacalcet (SENSIPAR) 30 MG tablet Take 2 tablets (60 mg total) by mouth daily. (Patient taking differently: Take 30 mg by mouth daily. )  . DULoxetine (CYMBALTA) 30 MG capsule Take 1 capsule (30 mg total) by mouth daily.  Marland Kitchen gabapentin (NEURONTIN) 300 MG capsule TAKE 1 CAPSULE(300 MG) BY MOUTH AT BEDTIME  . HYDROcodone-acetaminophen (NORCO) 5-325 MG tablet Take 1 tablet by mouth every 4 (four) hours as needed for moderate pain.  Marland Kitchen lanthanum (FOSRENOL) 1000 MG chewable tablet Chew 1,000-2,000 mg by mouth See admin instructions. Take 2000 mg by mouth twice daily with meals and 1000 with a snack  . lidocaine-prilocaine (EMLA) cream APPLY SMALL AMOUNT TO ACCESS SITE (AVF) 1 TO 2 HOURS BEFORE DIALYSIS. COVER WITH OCCLUSIVE DRESSING (SARAN WRAP)  . losartan (COZAAR) 50 MG tablet Take 1 tablet (50 mg total) by mouth daily. (Patient taking differently: Take 50 mg by mouth at bedtime. )  . nebivolol (BYSTOLIC) 10 MG tablet Take 1 tablet (10 mg total) by mouth  2 (two) times daily. (Patient taking differently: Take 10 mg by mouth 3 (three) times a week. Take on the days of dialysis Tuesdays, Thursdays, and Saturdays)  . ondansetron (ZOFRAN) 4 MG tablet Take 1 tablet (4 mg total) by mouth every 8 (eight) hours as needed  for nausea or vomiting.  . pantoprazole (PROTONIX) 40 MG tablet TAKE 1 TABLET(40 MG) BY MOUTH DAILY (Patient taking differently: Take 40 mg by mouth daily before breakfast. )  . prochlorperazine (COMPAZINE) 10 MG tablet Take 1 tablet (10 mg total) by mouth every 6 (six) hours as needed (Nausea or vomiting).  Marland Kitchen tiZANidine (ZANAFLEX) 4 MG capsule TAKE 1 CAPSULE(4 MG) BY MOUTH TWICE DAILY WITH A MEAL. START 1 CAPSULE AT NIGHT FOR 2 WEEKS AND THEN TWICE DAILY   No facility-administered encounter medications on file as of 12/23/2018.     ALLERGIES:  No Known Allergies   PHYSICAL EXAM:  ECOG Performance status: 1  Vitals:   12/23/18 0945  BP: (!) 168/83  Pulse: 96  Resp: 18  Temp: 98.5 F (36.9 C)  SpO2: 96%   Filed Weights   12/23/18 0945  Weight: 168 lb 4.8 oz (76.3 kg)    Physical Exam Constitutional:      Appearance: Normal appearance. She is normal weight.  Cardiovascular:     Rate and Rhythm: Normal rate and regular rhythm.     Heart sounds: Normal heart sounds.  Pulmonary:     Effort: Pulmonary effort is normal.     Breath sounds: Normal breath sounds.  Abdominal:     General: Bowel sounds are normal.     Palpations: Abdomen is soft.  Musculoskeletal: Normal range of motion.  Skin:    General: Skin is warm and dry.  Neurological:     Mental Status: She is alert and oriented to person, place, and time. Mental status is at baseline.  Psychiatric:        Mood and Affect: Mood normal.        Behavior: Behavior normal.        Thought Content: Thought content normal.        Judgment: Judgment normal.      LABORATORY DATA:  I have reviewed the labs as listed.  CBC    Component Value Date/Time   WBC 16.8  (H) 12/12/2018 1258   RBC 2.90 (L) 12/12/2018 1258   HGB 9.3 (L) 12/12/2018 1258   HCT 28.1 (L) 12/12/2018 1258   PLT 356 12/12/2018 1258   MCV 96.9 12/12/2018 1258   MCH 32.1 12/12/2018 1258   MCHC 33.1 12/12/2018 1258   RDW 16.9 (H) 12/12/2018 1258   LYMPHSABS 1.4 12/11/2018 0844   MONOABS 0.9 12/11/2018 0844   EOSABS 0.1 12/11/2018 0844   BASOSABS 0.1 12/11/2018 0844   CMP Latest Ref Rng & Units 12/12/2018 12/11/2018 12/10/2018  Glucose 70 - 99 mg/dL 149(H) 107(H) 206(H)  BUN 6 - 20 mg/dL 27(H) 35(H) 55(H)  Creatinine 0.44 - 1.00 mg/dL 4.59(H) 7.06(H) 10.95(H)  Sodium 135 - 145 mmol/L 136 138 138  Potassium 3.5 - 5.1 mmol/L 3.8 3.5 3.5  Chloride 98 - 111 mmol/L 96(L) 98 103  CO2 22 - 32 mmol/L 27 26 24   Calcium 8.9 - 10.3 mg/dL 8.9 8.7(L) 8.4(L)  Total Protein 6.5 - 8.1 g/dL - 7.2 -  Total Bilirubin 0.3 - 1.2 mg/dL - 0.5 -  Alkaline Phos 38 - 126 U/L - 122 -  AST 15 - 41 U/L - 17 -  ALT 0 - 44 U/L - 20 -   I personally performed a face-to-face visit.  All questions were answered to patient's stated satisfaction. Encouraged patient to call with any new concerns or questions before his next visit to the cancer center and we can certain see him sooner,  if needed.     ASSESSMENT & PLAN:   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[place]) 1.  Stage Ib right breast cancer: - She had a screening mammogram on 02/18/2018 that suggested a right breast mass. - Diagnostic mammogram on 02/26/2018 showed a 0.5 x 0.3 x 0.4 cm mass in the right breast.  Ultrasound biopsy was recommended. -Right breast biopsy on 03/05/2018 showed benign breast tissue with areas of dense hyalin fibrosis. -Right breast mammogram on 08/20/2018 showed suspicious mass in the 1 o'clock position of the right breast with no adenopathy. -Left breast mammogram on 08/27/2018 showed B RADS category 1. -Right breast mammogram on 08/27/2018 at 1:00 consistent with infiltrating ductal carcinoma. -Right  lumpectomy on 09/18/2018 showed a 1.5 cm invasive ductal carcinoma, grade 3, associated high-grade DCIS, negative margins, 0 out of 3 lymph nodes positive.  ER 50% positive, PR negative, and HER-2 negative.  Ki-67 was 60%. -Oncotype DX testing results recurrence score at 47.  Distant recurrence risk at 9 years with tamoxifen alone is 36%.  Absolute chemotherapy benefit is more than 15% with her recurrence score. - Because of her high risk score she started [facility] on 10/30/2018. - She just completed her third cycle on 12/11/2018. -She reports she is feeling well no nausea vomiting diarrhea.  Skin and nails are WNL.  - She was seen back today on 12/16/2018 due to her low blood pressure.  She is been sent to the hospital on numerous occasions for her blood pressure being low.  This is mostly on her dialysis days. -On 12/16/2018 I recommended she check her blood pressure twice a day prior to taking her blood pressure medicine.  If her blood pressure is less than 120/80 do not take her medicine.  She is mainly having problems on her dialysis evenings.  I have recommended her not take her Bystolic evening dose of medication. -I saw her back today on 12/23/2018 she brought me a list of her blood pressures over the last week.  She reports they have been creeping up higher.  She also has had no lightheadedness or blackouts this past week.  I recommended that she take her blood pressure medicine as prescribed previously.  If she has any more spells of blacking out to call the office.  I also told her do not take her blood pressure medicine if her blood pressure is less than 120/80.  She is done much better this week.  She will follow-up next Wednesday with labs and treatment and office visit with Dr. Delton Coombes. - She will continue to take her blood pressure twice a day and follow-up with 1 week with the readings.  2.  ESRD on HD: - She has been on hemodialysis for the past 1 and 1/2 years. - ESRD thought to be secondary to  hypertension. - She has been having episodes of low blood pressure on her dialysis days and evenings.     Orders placed this encounter:  No orders of the defined types were placed in this encounter.   Francene Finders, FNP-C [place] [phone]

## 2018-12-24 DIAGNOSIS — N186 End stage renal disease: Secondary | ICD-10-CM | POA: Diagnosis not present

## 2018-12-24 DIAGNOSIS — D631 Anemia in chronic kidney disease: Secondary | ICD-10-CM | POA: Diagnosis not present

## 2018-12-24 DIAGNOSIS — E1129 Type 2 diabetes mellitus with other diabetic kidney complication: Secondary | ICD-10-CM | POA: Diagnosis not present

## 2018-12-24 DIAGNOSIS — E119 Type 2 diabetes mellitus without complications: Secondary | ICD-10-CM | POA: Diagnosis not present

## 2018-12-24 DIAGNOSIS — N2581 Secondary hyperparathyroidism of renal origin: Secondary | ICD-10-CM | POA: Diagnosis not present

## 2018-12-26 DIAGNOSIS — D631 Anemia in chronic kidney disease: Secondary | ICD-10-CM | POA: Diagnosis not present

## 2018-12-26 DIAGNOSIS — N2581 Secondary hyperparathyroidism of renal origin: Secondary | ICD-10-CM | POA: Diagnosis not present

## 2018-12-26 DIAGNOSIS — N186 End stage renal disease: Secondary | ICD-10-CM | POA: Diagnosis not present

## 2018-12-26 DIAGNOSIS — E1129 Type 2 diabetes mellitus with other diabetic kidney complication: Secondary | ICD-10-CM | POA: Diagnosis not present

## 2018-12-26 DIAGNOSIS — E119 Type 2 diabetes mellitus without complications: Secondary | ICD-10-CM | POA: Diagnosis not present

## 2018-12-27 ENCOUNTER — Other Ambulatory Visit: Payer: Self-pay

## 2018-12-27 ENCOUNTER — Inpatient Hospital Stay (HOSPITAL_COMMUNITY): Payer: Medicare Other

## 2018-12-27 ENCOUNTER — Encounter (HOSPITAL_COMMUNITY): Payer: Self-pay

## 2018-12-27 VITALS — BP 145/83 | HR 88 | Temp 99.0°F | Resp 18

## 2018-12-27 DIAGNOSIS — Z170 Estrogen receptor positive status [ER+]: Secondary | ICD-10-CM | POA: Diagnosis not present

## 2018-12-27 DIAGNOSIS — I959 Hypotension, unspecified: Secondary | ICD-10-CM | POA: Diagnosis not present

## 2018-12-27 DIAGNOSIS — C50211 Malignant neoplasm of upper-inner quadrant of right female breast: Secondary | ICD-10-CM

## 2018-12-27 DIAGNOSIS — K219 Gastro-esophageal reflux disease without esophagitis: Secondary | ICD-10-CM | POA: Diagnosis not present

## 2018-12-27 DIAGNOSIS — D649 Anemia, unspecified: Secondary | ICD-10-CM | POA: Diagnosis not present

## 2018-12-27 DIAGNOSIS — Z5111 Encounter for antineoplastic chemotherapy: Secondary | ICD-10-CM | POA: Diagnosis not present

## 2018-12-27 MED ORDER — HEPARIN SOD (PORK) LOCK FLUSH 100 UNIT/ML IV SOLN
500.0000 [IU] | Freq: Once | INTRAVENOUS | Status: AC
  Administered 2018-12-27: 500 [IU] via INTRAVENOUS

## 2018-12-27 MED ORDER — SODIUM CHLORIDE 0.9% FLUSH
10.0000 mL | Freq: Once | INTRAVENOUS | Status: AC
  Administered 2018-12-27: 10 mL via INTRAVENOUS

## 2018-12-27 NOTE — Progress Notes (Signed)
PICC line flushed without difficulty.  Insertion site clean and dry with no tenderness, swelling, or drainage noted.   Dressing intact.  Good blood return noted.  No s/s of distress noted.

## 2018-12-28 DIAGNOSIS — N2581 Secondary hyperparathyroidism of renal origin: Secondary | ICD-10-CM | POA: Diagnosis not present

## 2018-12-28 DIAGNOSIS — E1129 Type 2 diabetes mellitus with other diabetic kidney complication: Secondary | ICD-10-CM | POA: Diagnosis not present

## 2018-12-28 DIAGNOSIS — E119 Type 2 diabetes mellitus without complications: Secondary | ICD-10-CM | POA: Diagnosis not present

## 2018-12-28 DIAGNOSIS — N186 End stage renal disease: Secondary | ICD-10-CM | POA: Diagnosis not present

## 2018-12-28 DIAGNOSIS — D631 Anemia in chronic kidney disease: Secondary | ICD-10-CM | POA: Diagnosis not present

## 2018-12-30 ENCOUNTER — Encounter (HOSPITAL_COMMUNITY): Payer: Self-pay

## 2018-12-30 ENCOUNTER — Other Ambulatory Visit: Payer: Self-pay

## 2018-12-30 ENCOUNTER — Inpatient Hospital Stay (HOSPITAL_COMMUNITY): Payer: Medicare Other

## 2018-12-30 ENCOUNTER — Encounter (HOSPITAL_COMMUNITY): Payer: Medicare Other

## 2018-12-30 VITALS — BP 162/76 | HR 83 | Temp 98.4°F | Resp 16

## 2018-12-30 DIAGNOSIS — K219 Gastro-esophageal reflux disease without esophagitis: Secondary | ICD-10-CM | POA: Diagnosis not present

## 2018-12-30 DIAGNOSIS — Z5111 Encounter for antineoplastic chemotherapy: Secondary | ICD-10-CM | POA: Diagnosis not present

## 2018-12-30 DIAGNOSIS — I959 Hypotension, unspecified: Secondary | ICD-10-CM | POA: Diagnosis not present

## 2018-12-30 DIAGNOSIS — C50211 Malignant neoplasm of upper-inner quadrant of right female breast: Secondary | ICD-10-CM | POA: Diagnosis not present

## 2018-12-30 DIAGNOSIS — Z170 Estrogen receptor positive status [ER+]: Secondary | ICD-10-CM | POA: Diagnosis not present

## 2018-12-30 DIAGNOSIS — D649 Anemia, unspecified: Secondary | ICD-10-CM | POA: Diagnosis not present

## 2018-12-30 MED ORDER — SODIUM CHLORIDE 0.9% FLUSH
10.0000 mL | Freq: Once | INTRAVENOUS | Status: AC
  Administered 2018-12-30: 10 mL via INTRAVENOUS

## 2018-12-30 MED ORDER — HEPARIN SOD (PORK) LOCK FLUSH 100 UNIT/ML IV SOLN
500.0000 [IU] | Freq: Once | INTRAVENOUS | Status: AC
  Administered 2018-12-30: 500 [IU] via INTRAVENOUS

## 2018-12-30 MED ORDER — CYANOCOBALAMIN 1000 MCG/ML IJ SOLN
INTRAMUSCULAR | Status: AC
  Filled 2018-12-30: qty 1

## 2018-12-30 NOTE — Progress Notes (Signed)
PICC line changed per policy.  PICC insertion site clean and dry with no bruising, swelling, drainage, or redness noted.  Flushed easily with good blood return noted.  VSs with discharge and left ambulatory with no s/s of distress noted.

## 2018-12-31 DIAGNOSIS — E119 Type 2 diabetes mellitus without complications: Secondary | ICD-10-CM | POA: Diagnosis not present

## 2018-12-31 DIAGNOSIS — E1129 Type 2 diabetes mellitus with other diabetic kidney complication: Secondary | ICD-10-CM | POA: Diagnosis not present

## 2018-12-31 DIAGNOSIS — N2581 Secondary hyperparathyroidism of renal origin: Secondary | ICD-10-CM | POA: Diagnosis not present

## 2018-12-31 DIAGNOSIS — D631 Anemia in chronic kidney disease: Secondary | ICD-10-CM | POA: Diagnosis not present

## 2018-12-31 DIAGNOSIS — N186 End stage renal disease: Secondary | ICD-10-CM | POA: Diagnosis not present

## 2019-01-01 ENCOUNTER — Other Ambulatory Visit: Payer: Self-pay

## 2019-01-01 ENCOUNTER — Inpatient Hospital Stay (HOSPITAL_COMMUNITY): Payer: Medicare Other

## 2019-01-01 ENCOUNTER — Encounter (HOSPITAL_COMMUNITY): Payer: Self-pay

## 2019-01-01 ENCOUNTER — Encounter (HOSPITAL_COMMUNITY): Payer: Self-pay | Admitting: Hematology

## 2019-01-01 ENCOUNTER — Other Ambulatory Visit (HOSPITAL_COMMUNITY): Payer: Medicare Other

## 2019-01-01 ENCOUNTER — Inpatient Hospital Stay (HOSPITAL_BASED_OUTPATIENT_CLINIC_OR_DEPARTMENT_OTHER): Payer: Medicare Other | Admitting: Hematology

## 2019-01-01 VITALS — BP 138/55 | HR 87 | Temp 98.3°F | Resp 18 | Wt 165.4 lb

## 2019-01-01 VITALS — BP 141/72 | HR 75 | Temp 97.8°F | Resp 18

## 2019-01-01 DIAGNOSIS — Z170 Estrogen receptor positive status [ER+]: Secondary | ICD-10-CM

## 2019-01-01 DIAGNOSIS — Z9221 Personal history of antineoplastic chemotherapy: Secondary | ICD-10-CM

## 2019-01-01 DIAGNOSIS — Z79899 Other long term (current) drug therapy: Secondary | ICD-10-CM

## 2019-01-01 DIAGNOSIS — K219 Gastro-esophageal reflux disease without esophagitis: Secondary | ICD-10-CM | POA: Diagnosis not present

## 2019-01-01 DIAGNOSIS — Z5111 Encounter for antineoplastic chemotherapy: Secondary | ICD-10-CM | POA: Diagnosis not present

## 2019-01-01 DIAGNOSIS — C50211 Malignant neoplasm of upper-inner quadrant of right female breast: Secondary | ICD-10-CM | POA: Diagnosis not present

## 2019-01-01 DIAGNOSIS — E1122 Type 2 diabetes mellitus with diabetic chronic kidney disease: Secondary | ICD-10-CM | POA: Diagnosis not present

## 2019-01-01 DIAGNOSIS — N186 End stage renal disease: Secondary | ICD-10-CM | POA: Diagnosis not present

## 2019-01-01 DIAGNOSIS — I959 Hypotension, unspecified: Secondary | ICD-10-CM | POA: Diagnosis not present

## 2019-01-01 DIAGNOSIS — I120 Hypertensive chronic kidney disease with stage 5 chronic kidney disease or end stage renal disease: Secondary | ICD-10-CM

## 2019-01-01 DIAGNOSIS — Z992 Dependence on renal dialysis: Secondary | ICD-10-CM | POA: Diagnosis not present

## 2019-01-01 DIAGNOSIS — E785 Hyperlipidemia, unspecified: Secondary | ICD-10-CM | POA: Diagnosis not present

## 2019-01-01 DIAGNOSIS — D649 Anemia, unspecified: Secondary | ICD-10-CM | POA: Diagnosis not present

## 2019-01-01 DIAGNOSIS — Z7982 Long term (current) use of aspirin: Secondary | ICD-10-CM

## 2019-01-01 LAB — COMPREHENSIVE METABOLIC PANEL
ALT: 15 U/L (ref 0–44)
AST: 12 U/L — ABNORMAL LOW (ref 15–41)
Albumin: 3.6 g/dL (ref 3.5–5.0)
Alkaline Phosphatase: 65 U/L (ref 38–126)
Anion gap: 12 (ref 5–15)
BUN: 32 mg/dL — ABNORMAL HIGH (ref 6–20)
CO2: 27 mmol/L (ref 22–32)
Calcium: 9 mg/dL (ref 8.9–10.3)
Chloride: 99 mmol/L (ref 98–111)
Creatinine, Ser: 7.22 mg/dL — ABNORMAL HIGH (ref 0.44–1.00)
GFR calc Af Amer: 7 mL/min — ABNORMAL LOW (ref >60)
GFR calc non Af Amer: 6 mL/min — ABNORMAL LOW (ref >60)
Glucose, Bld: 126 mg/dL — ABNORMAL HIGH (ref 70–99)
Potassium: 4.1 mmol/L (ref 3.5–5.1)
Sodium: 138 mmol/L (ref 135–145)
Total Bilirubin: 0.5 mg/dL (ref 0.3–1.2)
Total Protein: 7 g/dL (ref 6.5–8.1)

## 2019-01-01 LAB — CBC WITH DIFFERENTIAL/PLATELET
Abs Immature Granulocytes: 0.06 10*3/uL (ref 0.00–0.07)
Basophils Absolute: 0.1 10*3/uL (ref 0.0–0.1)
Basophils Relative: 1 %
Eosinophils Absolute: 0.1 10*3/uL (ref 0.0–0.5)
Eosinophils Relative: 1 %
HCT: 25.7 % — ABNORMAL LOW (ref 36.0–46.0)
Hemoglobin: 8.1 g/dL — ABNORMAL LOW (ref 12.0–15.0)
Immature Granulocytes: 1 %
Lymphocytes Relative: 16 %
Lymphs Abs: 1.6 10*3/uL (ref 0.7–4.0)
MCH: 32.3 pg (ref 26.0–34.0)
MCHC: 31.5 g/dL (ref 30.0–36.0)
MCV: 102.4 fL — ABNORMAL HIGH (ref 80.0–100.0)
Monocytes Absolute: 1 10*3/uL (ref 0.1–1.0)
Monocytes Relative: 10 %
Neutro Abs: 7.1 10*3/uL (ref 1.7–7.7)
Neutrophils Relative %: 71 %
Platelets: 366 10*3/uL (ref 150–400)
RBC: 2.51 MIL/uL — ABNORMAL LOW (ref 3.87–5.11)
RDW: 16.9 % — ABNORMAL HIGH (ref 11.5–15.5)
WBC: 9.9 10*3/uL (ref 4.0–10.5)
nRBC: 0 % (ref 0.0–0.2)

## 2019-01-01 MED ORDER — HEPARIN SOD (PORK) LOCK FLUSH 100 UNIT/ML IV SOLN
250.0000 [IU] | Freq: Once | INTRAVENOUS | Status: AC | PRN
  Administered 2019-01-01: 250 [IU]

## 2019-01-01 MED ORDER — PEGFILGRASTIM 6 MG/0.6ML ~~LOC~~ PSKT
6.0000 mg | PREFILLED_SYRINGE | Freq: Once | SUBCUTANEOUS | Status: AC
  Administered 2019-01-01: 6 mg via SUBCUTANEOUS
  Filled 2019-01-01: qty 0.6

## 2019-01-01 MED ORDER — DOXORUBICIN HCL CHEMO IV INJECTION 2 MG/ML
60.0000 mg/m2 | Freq: Once | INTRAVENOUS | Status: AC
  Administered 2019-01-01: 12:00:00 108 mg via INTRAVENOUS
  Filled 2019-01-01: qty 54

## 2019-01-01 MED ORDER — SODIUM CHLORIDE 0.9 % IV SOLN
Freq: Once | INTRAVENOUS | Status: AC
  Administered 2019-01-01: 11:00:00 via INTRAVENOUS
  Filled 2019-01-01: qty 5

## 2019-01-01 MED ORDER — PALONOSETRON HCL INJECTION 0.25 MG/5ML
0.2500 mg | Freq: Once | INTRAVENOUS | Status: AC
  Administered 2019-01-01: 11:00:00 0.25 mg via INTRAVENOUS

## 2019-01-01 MED ORDER — SODIUM CHLORIDE 0.9 % IV SOLN
Freq: Once | INTRAVENOUS | Status: AC
  Administered 2019-01-01: 10:00:00 via INTRAVENOUS

## 2019-01-01 MED ORDER — SODIUM CHLORIDE 0.9% FLUSH
10.0000 mL | INTRAVENOUS | Status: DC | PRN
  Administered 2019-01-01: 10 mL
  Filled 2019-01-01: qty 10

## 2019-01-01 MED ORDER — SODIUM CHLORIDE 0.9 % IV SOLN
450.0000 mg/m2 | Freq: Once | INTRAVENOUS | Status: AC
  Administered 2019-01-01: 820 mg via INTRAVENOUS
  Filled 2019-01-01: qty 41

## 2019-01-01 NOTE — Patient Instructions (Signed)
[facility] Discharge Instructions  You were seen today by Dr. Katragadda. He went over your recent lab results. He will see you back in 4 weeks for labs and follow up.   Thank you for choosing  Cancer Center at [facility] to provide your oncology and hematology care.  To afford each patient quality time with our provider, please arrive at least 15 minutes before your scheduled appointment time.   If you have a lab appointment with the Cancer Center please come in thru the  Main Entrance and check in at the main information desk  You need to re-schedule your appointment should you arrive 10 or more minutes late.  We strive to give you quality time with our providers, and arriving late affects you and other patients whose appointments are after yours.  Also, if you no show three or more times for appointments you may be dismissed from the clinic at the providers discretion.     Again, thank you for choosing [facility].  Our hope is that these requests will decrease the amount of time that you wait before being seen by our physicians.       _____________________________________________________________  Should you have questions after your visit to [facility], please contact our office at [phone] between the hours of 8:00 a.m. and 4:30 p.m.  Voicemails left after 4:00 p.m. will not be returned until the following business day.  For prescription refill requests, have your pharmacy contact our office and allow 72 hours.    Cancer Center Support Programs:   > Cancer Support Group  2nd Tuesday of the month 1pm-2pm, Journey Room    

## 2019-01-01 NOTE — Patient Instructions (Signed)
[facility] Discharge Instructions for Patients Receiving Chemotherapy  Today you received the following chemotherapy agents   To help prevent nausea and vomiting after your treatment, we encourage you to take your nausea medication   If you develop nausea and vomiting that is not controlled by your nausea medication, call the clinic.   BELOW ARE SYMPTOMS THAT SHOULD BE REPORTED IMMEDIATELY:  *FEVER GREATER THAN 100.5 F  *CHILLS WITH OR WITHOUT FEVER  NAUSEA AND VOMITING THAT IS NOT CONTROLLED WITH YOUR NAUSEA MEDICATION  *UNUSUAL SHORTNESS OF BREATH  *UNUSUAL BRUISING OR BLEEDING  TENDERNESS IN MOUTH AND THROAT WITH OR WITHOUT PRESENCE OF ULCERS  *URINARY PROBLEMS  *BOWEL PROBLEMS  UNUSUAL RASH Items with * indicate a potential emergency and should be followed up as soon as possible.  Feel free to call the clinic should you have any questions or concerns. The clinic phone number is [phone].  Please show the CHEMO ALERT CARD at check-in to the Emergency Department and triage nurse.   

## 2019-01-01 NOTE — Progress Notes (Signed)
Labs reviewed with MD at office visit today. Proceed today with last treatment.   Marland KitchenZadie [name] [facility] for [facility]asta [facility] body injector. See MAR for administration details. Injector in place and engaged with green light indicator on flashing. Tolerated application with out problems.   Treatment given per orders. Patient tolerated it well without problems. Vitals stable and discharged home from clinic ambulatory. Follow up as scheduled.

## 2019-01-01 NOTE — Progress Notes (Signed)
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[place])    09/26/2018 Cancer Staging    Staging form: Breast, AJCC 8th Edition - Clinical stage from 09/26/2018: Stage IB (cT1c, cN0, cM0, G3, ER+, PR-, HER2-) - Signed by Megan Jack, MD on 09/26/2018    10/30/2018 -  Chemotherapy    The patient had DOXOrubicin (ADRIAMYCIN) chemo injection 108 mg, 60 mg/m2 = 108 mg, Intravenous,  Once, 4 of 4 cycles Administration: 108 mg (10/30/2018), 108 mg (11/20/2018), 108 mg (12/11/2018), 108 mg (01/01/2019) palonosetron (ALOXI) injection 0.25 mg, 0.25 mg, Intravenous,  Once, 4 of 4 cycles Administration: 0.25 mg (10/30/2018), 0.25 mg (11/20/2018), 0.25 mg (12/11/2018), 0.25 mg (01/01/2019) pegfilgrastim (NEULASTA ONPRO KIT) injection 6 mg, 6 mg, Subcutaneous, Once, 4 of 4 cycles Administration: 6 mg (10/30/2018), 6 mg (11/20/2018), 6 mg (12/11/2018), 6 mg (01/01/2019) cyclophosphamide (CYTOXAN) 820 mg in sodium chloride 0.9 % 250 mL chemo infusion, 450 mg/m2 = 820 mg (75 % of original dose 600 mg/m2), Intravenous,  Once, 4 of 4 cycles Dose modification: 450 mg/m2 (75 % of original dose 600 mg/m2, Cycle 1, Reason: Other (see comments), Comment: hemodialysis) Administration: 820 mg (10/30/2018), 820 mg (11/20/2018), 820 mg (12/11/2018), 820 mg (01/01/2019) fosaprepitant (EMEND) 150 mg, dexamethasone (DECADRON) 12 mg in sodium chloride 0.9 % 145 mL IVPB, , Intravenous,  Once, 4 of 4 cycles Administration:  (10/30/2018),  (11/20/2018),  (12/11/2018),  (01/01/2019)  for chemotherapy treatment.       CANCER STAGING: Cancer  Staging Malignant neoplasm of upper-inner quadrant of right female breast [facility]) Staging form: Breast, AJCC 8th Edition - Clinical stage from 09/26/2018: Stage IB (cT1c, cN0, cM0, G3, ER+, PR-, HER2-) - Signed by Megan Jack, MD on 09/26/2018    INTERVAL HISTORY:  Megan [name] 58 y.o. female returns for routine follow-up and consideration for next cycle of chemotherapy. She is here today alone. She states that she had a recent ER visit for decreased Blood Pressure. Denies any nausea, vomiting, or diarrhea. Denies any new pains. Had not noticed any recent bleeding such as epistaxis, hematuria or hematochezia. Denies recent chest pain on exertion, shortness of breath on minimal exertion, pre-syncopal episodes, or palpitations. Denies any numbness or tingling in hands or feet. Denies any recent fevers, infections, or recent hospitalizations. Patient reports appetite at 75% and energy level at 50%.     REVIEW OF SYSTEMS:  Review of Systems  All other systems reviewed and are negative.    PAST MEDICAL/SURGICAL HISTORY:  Past Medical History:  Diagnosis Date  . Anemia   . Blood transfusion without reported diagnosis   . Cataract   . CKD (chronic kidney disease) stage 3, GFR 30-59 ml/min (HCC)   . Essential hypertension, benign   . GERD (gastroesophageal reflux disease)   . Headache   . Hemorrhoids   . Mixed hyperlipidemia   . PONV (postoperative nausea and vomiting)   . Renal insufficiency   . S/P colonoscopy Jan 2011   Dr. Benson [name]: sessile polyp (benign lymphoid), large hemorrhoids, repeat 5-10 years  . Temporal arteritis ([place])   .  Type 2 diabetes mellitus ([place])   . Wears glasses    Past Surgical History:  Procedure Laterality Date  . ABDOMINAL HYSTERECTOMY    . APPENDECTOMY    . ARTERY BIOPSY N/A 05/09/2018   Procedure: RIGHT TEMPORAL ARTERY BIOPSY;  Surgeon: Megan Horn, MD;  Location: [place];  Service: General;  Laterality: N/A;  . CATARACT EXTRACTION W/PHACO Left  02/09/2017   Procedure: CATARACT EXTRACTION PHACO AND INTRAOCULAR LENS PLACEMENT LEFT EYE;  Surgeon: Megan Branch, MD;  Location: [facility];  Service: Ophthalmology;  Laterality: Left;  CDE: 4.89  . CATARACT EXTRACTION W/PHACO Right 06/04/2017   Procedure: CATARACT EXTRACTION PHACO AND INTRAOCULAR LENS PLACEMENT (IOC);  Surgeon: Megan Branch, MD;  Location: [facility];  Service: Ophthalmology;  Laterality: Right;  CDE: 4.12  . CHOLECYSTECTOMY  09/29/2011   Procedure: LAPAROSCOPIC CHOLECYSTECTOMY;  Surgeon: Megan So, MD;  Location: [facility];  Service: General;  Laterality: N/A;  . COLONOSCOPY  Jan 2011   Dr. Benson [name]: sessile polyp (benign lymphoid), large hemorrhoids, repeat 5-10 years  . COLONOSCOPY N/A 06/12/2016   Procedure: COLONOSCOPY;  Surgeon: Megan Dolin, MD;  [facility];  Service: Endoscopy;  Laterality: N/A;  1230   . ESOPHAGOGASTRODUODENOSCOPY  09/05/2011   MLY:YTKPT hiatal hernia; remainder of exam normal. No explanation for patient's abdominal pain with today's examination  . ESOPHAGOGASTRODUODENOSCOPY N/A 12/17/2013   Dr. Gala [name]: gastric erythema, erosion, mild chronic inflammation on path   . LAPAROSCOPIC APPENDECTOMY  09/29/2011   Procedure: APPENDECTOMY LAPAROSCOPIC;  Surgeon: Megan So, MD;  Location: [facility];  Service: General;;  incidental appendectomy  . PARTIAL MASTECTOMY WITH NEEDLE LOCALIZATION AND AXILLARY SENTINEL LYMPH NODE BX Right 09/18/2018   Procedure: RIGHT PARTIAL MASTECTOMY AFTER NEEDLE LOCALIZATION, SENTINEL LYMPH NODE BIOPSY RIGHT AXILLA;  Surgeon: Megan Signs, MD;  Location: [facility];  Service: General;  Laterality: Right;     SOCIAL HISTORY:  Social History   Socioeconomic History  . Marital status: Married    Spouse name: Not on file  . Number of children: Not on file  . Years of education: Not on file  . Highest education level: Not on file  Occupational History  . Occupation: Merchandiser, retail: [employer] [phone]  Social Needs  .  Financial resource strain: Not hard at all  . Food insecurity:    Worry: Never true    Inability: Never true  . Transportation needs:    Medical: No    Non-medical: No  Tobacco Use  . Smoking status: Never Smoker  . Smokeless tobacco: Never Used  Substance and Sexual Activity  . Alcohol use: No  . Drug use: No  . Sexual activity: Yes    Birth control/protection: Surgical  Lifestyle  . Physical activity:    Days per week: 0 days    Minutes per session: 0 min  . Stress: Not at all  Relationships  . Social connections:    Talks on phone: Twice a week    Gets together: Twice a week    Attends religious service: More than 4 times per year    Active member of club or organization: No    Attends meetings of clubs or organizations: Never    Relationship status: Married  . Intimate partner violence:    Fear of current or ex partner: No    Emotionally abused: No    Physically abused: No    Forced sexual activity: No  Other Topics Concern  . Not  on file  Social History Narrative   Works at [employer] in [place].    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   Also has to get items from high shelves-causes achy pain in shoulder area      Married.   Children are grown, out of house.    FAMILY HISTORY:  Family History  Problem Relation Age of Onset  . Hypertension Mother   . Coronary artery disease Mother   . Diabetes Mother   . Hypertension Sister   . Coronary artery disease Sister   . Hypertension Brother   . Heart attack Father   . Hypertension Son   . Heart attack Maternal Aunt   . Hypertension Maternal Aunt   . Diabetes Maternal Aunt   . Heart attack Maternal Uncle   . Hypertension Maternal Uncle   . Diabetes Maternal Uncle   . Heart attack Paternal Aunt   . Hypertension Paternal Aunt   . Diabetes Paternal Aunt   . Heart attack Paternal Uncle   . Hypertension Paternal Uncle   . Diabetes Paternal Uncle   . Heart attack Maternal Grandmother   . Heart  attack Maternal Grandfather   . Heart attack Paternal Grandmother   . Heart attack Paternal Grandfather   . Colon cancer Neg Hx     CURRENT MEDICATIONS:  Outpatient Encounter Medications as of 01/01/2019  Medication Sig  . amLODipine (NORVASC) 5 MG tablet Take 1 tablet (5 mg total) by mouth daily. (Patient taking differently: Take 5 mg by mouth daily as needed (blood pressure 159 or above). )  . aspirin EC 81 MG tablet Take 81 mg by mouth daily.  Marland Kitchen atorvastatin (LIPITOR) 20 MG tablet Take 1 tablet (20 mg total) by mouth daily. (Patient taking differently: Take 20 mg by mouth at bedtime. )  . B Complex-C-Folic Acid (DIALYVITE 093) 0.8 MG TABS Take 800 mg by mouth daily.   . butalbital-acetaminophen-caffeine (FIORICET, ESGIC) 50-325-40 MG tablet Take 1 tablet by mouth every 6 (six) hours as needed for headache.  . cinacalcet (SENSIPAR) 30 MG tablet Take 2 tablets (60 mg total) by mouth daily. (Patient taking differently: Take 30 mg by mouth daily. )  . DULoxetine (CYMBALTA) 30 MG capsule Take 1 capsule (30 mg total) by mouth daily.  Marland Kitchen gabapentin (NEURONTIN) 300 MG capsule TAKE 1 CAPSULE(300 MG) BY MOUTH AT BEDTIME  . HYDROcodone-acetaminophen (NORCO) 5-325 MG tablet Take 1 tablet by mouth every 4 (four) hours as needed for moderate pain.  Marland Kitchen lanthanum (FOSRENOL) 1000 MG chewable tablet Chew 1,000-2,000 mg by mouth See admin instructions. Take 2000 mg by mouth twice daily with meals and 1000 with a snack  . lidocaine-prilocaine (EMLA) cream APPLY SMALL AMOUNT TO ACCESS SITE (AVF) 1 TO 2 HOURS BEFORE DIALYSIS. COVER WITH OCCLUSIVE DRESSING (SARAN WRAP)  . losartan (COZAAR) 50 MG tablet Take 1 tablet (50 mg total) by mouth daily. (Patient taking differently: Take 50 mg by mouth at bedtime. )  . nebivolol (BYSTOLIC) 10 MG tablet Take 1 tablet (10 mg total) by mouth 2 (two) times daily. (Patient taking differently: Take 10 mg by mouth 3 (three) times a week. Take on the days of dialysis Tuesdays,  Thursdays, and Saturdays)  . ondansetron (ZOFRAN) 4 MG tablet Take 1 tablet (4 mg total) by mouth every 8 (eight) hours as needed for nausea or vomiting.  . pantoprazole (PROTONIX) 40 MG tablet TAKE 1 TABLET(40 MG) BY MOUTH DAILY (Patient taking differently: Take 40  mg by mouth daily before breakfast. )  . prochlorperazine (COMPAZINE) 10 MG tablet Take 1 tablet (10 mg total) by mouth every 6 (six) hours as needed (Nausea or vomiting).  Marland Kitchen tiZANidine (ZANAFLEX) 4 MG capsule TAKE 1 CAPSULE(4 MG) BY MOUTH TWICE DAILY WITH A MEAL. START 1 CAPSULE AT NIGHT FOR 2 WEEKS AND THEN TWICE DAILY   No facility-administered encounter medications on file as of 01/01/2019.     ALLERGIES:  No Known Allergies   PHYSICAL EXAM:  ECOG Performance status: 1  Blood pressure is 158/70.  Pulse rate is 93.  Respirate is 18.  Temperature 98.6.  Oxygen saturation is 98. Physical Exam Vitals [name] reviewed.  Constitutional:      Appearance: Normal appearance.  Cardiovascular:     Rate and Rhythm: Normal rate and regular rhythm.     Heart sounds: Normal heart sounds.  Pulmonary:     Effort: Pulmonary effort is normal.     Breath sounds: Normal breath sounds.  Abdominal:     General: There is no distension.     Palpations: Abdomen is soft. There is no mass.  Musculoskeletal:        General: No swelling.  Skin:    General: Skin is warm.  Neurological:     General: No focal deficit present.     Mental Status: She is alert and oriented to person, place, and time.  Psychiatric:        Mood and Affect: Mood normal.        Behavior: Behavior normal.      LABORATORY DATA:  I have reviewed the labs as listed.  CBC    Component Value Date/Time   WBC 9.9 01/01/2019 0906   RBC 2.51 (L) 01/01/2019 0906   HGB 8.1 (L) 01/01/2019 0906   HCT 25.7 (L) 01/01/2019 0906   PLT 366 01/01/2019 0906   MCV 102.4 (H) 01/01/2019 0906   MCH 32.3 01/01/2019 0906   MCHC 31.5 01/01/2019 0906   RDW 16.9 (H) 01/01/2019 0906    LYMPHSABS 1.6 01/01/2019 0906   MONOABS 1.0 01/01/2019 0906   EOSABS 0.1 01/01/2019 0906   BASOSABS 0.1 01/01/2019 0906   CMP Latest Ref Rng & Units 01/01/2019 12/12/2018 12/11/2018  Glucose 70 - 99 mg/dL 126(H) 149(H) 107(H)  BUN 6 - 20 mg/dL 32(H) 27(H) 35(H)  Creatinine 0.44 - 1.00 mg/dL 7.22(H) 4.59(H) 7.06(H)  Sodium 135 - 145 mmol/L 138 136 138  Potassium 3.5 - 5.1 mmol/L 4.1 3.8 3.5  Chloride 98 - 111 mmol/L 99 96(L) 98  CO2 22 - 32 mmol/L 27 27 26   Calcium 8.9 - 10.3 mg/dL 9.0 8.9 8.7(L)  Total Protein 6.5 - 8.1 g/dL 7.0 - 7.2  Total Bilirubin 0.3 - 1.2 mg/dL 0.5 - 0.5  Alkaline Phos 38 - 126 U/L 65 - 122  AST 15 - 41 U/L 12(L) - 17  ALT 0 - 44 U/L 15 - 20       DIAGNOSTIC IMAGING:  I have independently reviewed the scans and discussed with the patient.   I have reviewed Venita Lick LPN's note and agree with the documentation.  I personally performed a face-to-face visit, made revisions and my assessment and plan is as follows.    ASSESSMENT & PLAN:   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[place]) 1.  Stage Ib (T1CN0) right breast cancer: -Screening mammogram on 02/18/2018 suggests right breast mass.  Right breast additional views on 02/26/2018 showed a 0.5 x 0.3  x 0.4 cm mass in the right breast.  Ultrasound biopsy was recommended. -Right breast biopsy on 03/05/2018 shows benign breast tissue with areas of dense hyalin fibrosis. -Right breast mammogram on 08/20/2018 shows suspicious mass in the 1 o'clock position of the right breast with no adenopathy. -Left breast mammogram on 08/27/2018 was BI-RADS Category 1. -Right breast biopsy on 08/27/2018 at 1:00 consistent with infiltrating ductal carcinoma. 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  ER 50% positive, PR negative, HER-2 negative, Ki 67-60%. - We discussed the pathology report in  detail. - Today we discussed about the Oncotype DX test results.  Recurrence score is 47.  Distant recurrence risk at 9 years with tamoxifen alone is 36%.  Absolute chemotherapy benefit is more than 15% with her recurrent score. - RT-PCR analysis of ER expression of the specimen shows ER was negative.  However ER was 50% positive by IHC. - Because of high recurrence score on Oncotype DX, she was recommended adjuvant chemotherapy. -4 cycles of AC from 10/30/2018 through 11/23/2018. -She tolerated cycle 3 very well.  Denied any major side effects. -She is back on her blood pressure medication and denies any lightheadedness. -She may proceed with last cycle of chemotherapy today.  She will have her blood work checked at dialysis.  If we need to give blood transfusion, she will receive it here. -I plan to do a baseline DEXA scan. -We plan to start her on aromatase inhibitor for at least 5 years. -She will come back in 4 weeks for follow-up.  2.  ESRD on HD: -She has been hemodialysis for the past 1 and half year. -ESRD thought to be secondary to hypertension.  3.  Neuropathy: -She developed neuropathy in the past 8 months, etiology unknown.  She has burning sensation in the bottom of her both feet, predominantly in the toes.  No neuropathic symptoms in the hands. -She is currently taking Cymbalta and gabapentin.   Total time spent is 25 minutes with more than 50% of the time spent face-to-face discussing and reinforcing chemotherapy regimen, side effects and coordination of care.    Orders placed this encounter:  Orders Placed This Encounter  Procedures  . DG Bone Density      Megan Jack, MD [place] [phone]

## 2019-01-01 NOTE — Assessment & Plan Note (Signed)
1.  Stage Ib (T1CN0) right breast cancer: -Screening mammogram on 02/18/2018 suggests right breast mass.  Right breast additional views on 02/26/2018 showed a 0.5 x 0.3 x 0.4 cm mass in the right breast.  Ultrasound biopsy was recommended. -Right breast biopsy on 03/05/2018 shows benign breast tissue with areas of dense hyalin fibrosis. -Right breast mammogram on 08/20/2018 shows suspicious mass in the 1 o'clock position of the right breast with no adenopathy. -Left breast mammogram on 08/27/2018 was BI-RADS Category 1. -Right breast biopsy on 08/27/2018 at 1:00 consistent with infiltrating ductal carcinoma. 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  ER 50% positive, PR negative, HER-2 negative, Ki 67-60%. - We discussed the pathology report in detail. - Today we discussed about the Oncotype DX test results.  Recurrence score is 47.  Distant recurrence risk at 9 years with tamoxifen alone is 36%.  Absolute chemotherapy benefit is more than 15% with her recurrent score. - RT-PCR analysis of ER expression of the specimen shows ER was negative.  However ER was 50% positive by IHC. - Because of high recurrence score on Oncotype DX, she was recommended adjuvant chemotherapy. -4 cycles of AC from 10/30/2018 through 11/23/2018. -She tolerated cycle 3 very well.  Denied any major side effects. -She is back on her blood pressure medication and denies any lightheadedness. -She may proceed with last cycle of chemotherapy today.  She will have her blood work checked at dialysis.  If we need to give blood transfusion, she will receive it here. -I plan to do a baseline DEXA scan. -We plan to start her on aromatase inhibitor for at least 5 years. -She will come back in 4 weeks for follow-up.  2.  ESRD on HD: -She has been hemodialysis for the past 1 and half year. -ESRD thought to be secondary to  hypertension.  3.  Neuropathy: -She developed neuropathy in the past 8 months, etiology unknown.  She has burning sensation in the bottom of her both feet, predominantly in the toes.  No neuropathic symptoms in the hands. -She is currently taking Cymbalta and gabapentin.

## 2019-01-02 ENCOUNTER — Encounter: Payer: Self-pay | Admitting: Nephrology

## 2019-01-02 DIAGNOSIS — N2581 Secondary hyperparathyroidism of renal origin: Secondary | ICD-10-CM | POA: Diagnosis not present

## 2019-01-02 DIAGNOSIS — E1129 Type 2 diabetes mellitus with other diabetic kidney complication: Secondary | ICD-10-CM | POA: Diagnosis not present

## 2019-01-02 DIAGNOSIS — D631 Anemia in chronic kidney disease: Secondary | ICD-10-CM | POA: Diagnosis not present

## 2019-01-02 DIAGNOSIS — N186 End stage renal disease: Secondary | ICD-10-CM | POA: Diagnosis not present

## 2019-01-02 DIAGNOSIS — E119 Type 2 diabetes mellitus without complications: Secondary | ICD-10-CM | POA: Diagnosis not present

## 2019-01-03 DIAGNOSIS — E119 Type 2 diabetes mellitus without complications: Secondary | ICD-10-CM | POA: Diagnosis not present

## 2019-01-03 DIAGNOSIS — N186 End stage renal disease: Secondary | ICD-10-CM | POA: Diagnosis not present

## 2019-01-03 DIAGNOSIS — N2581 Secondary hyperparathyroidism of renal origin: Secondary | ICD-10-CM | POA: Diagnosis not present

## 2019-01-03 DIAGNOSIS — D631 Anemia in chronic kidney disease: Secondary | ICD-10-CM | POA: Diagnosis not present

## 2019-01-03 DIAGNOSIS — E1129 Type 2 diabetes mellitus with other diabetic kidney complication: Secondary | ICD-10-CM | POA: Diagnosis not present

## 2019-01-07 ENCOUNTER — Other Ambulatory Visit: Payer: Self-pay

## 2019-01-07 ENCOUNTER — Encounter (HOSPITAL_COMMUNITY): Payer: Medicare Other

## 2019-01-07 ENCOUNTER — Encounter (HOSPITAL_COMMUNITY): Payer: Self-pay

## 2019-01-07 ENCOUNTER — Inpatient Hospital Stay (HOSPITAL_COMMUNITY): Payer: Medicare Other

## 2019-01-07 VITALS — BP 145/74 | HR 71 | Temp 97.8°F | Resp 18

## 2019-01-07 DIAGNOSIS — T451X5A Adverse effect of antineoplastic and immunosuppressive drugs, initial encounter: Secondary | ICD-10-CM

## 2019-01-07 DIAGNOSIS — I959 Hypotension, unspecified: Secondary | ICD-10-CM | POA: Diagnosis not present

## 2019-01-07 DIAGNOSIS — Z5111 Encounter for antineoplastic chemotherapy: Secondary | ICD-10-CM | POA: Diagnosis not present

## 2019-01-07 DIAGNOSIS — D649 Anemia, unspecified: Secondary | ICD-10-CM | POA: Diagnosis not present

## 2019-01-07 DIAGNOSIS — C50211 Malignant neoplasm of upper-inner quadrant of right female breast: Secondary | ICD-10-CM

## 2019-01-07 DIAGNOSIS — Z170 Estrogen receptor positive status [ER+]: Secondary | ICD-10-CM | POA: Diagnosis not present

## 2019-01-07 DIAGNOSIS — K219 Gastro-esophageal reflux disease without esophagitis: Secondary | ICD-10-CM | POA: Diagnosis not present

## 2019-01-07 DIAGNOSIS — E119 Type 2 diabetes mellitus without complications: Secondary | ICD-10-CM | POA: Diagnosis not present

## 2019-01-07 DIAGNOSIS — N186 End stage renal disease: Secondary | ICD-10-CM | POA: Diagnosis not present

## 2019-01-07 DIAGNOSIS — D6481 Anemia due to antineoplastic chemotherapy: Secondary | ICD-10-CM

## 2019-01-07 DIAGNOSIS — D631 Anemia in chronic kidney disease: Secondary | ICD-10-CM | POA: Diagnosis not present

## 2019-01-07 DIAGNOSIS — N2581 Secondary hyperparathyroidism of renal origin: Secondary | ICD-10-CM | POA: Diagnosis not present

## 2019-01-07 DIAGNOSIS — E1129 Type 2 diabetes mellitus with other diabetic kidney complication: Secondary | ICD-10-CM | POA: Diagnosis not present

## 2019-01-07 LAB — CBC WITH DIFFERENTIAL/PLATELET
Abs Immature Granulocytes: 0.18 10*3/uL — ABNORMAL HIGH (ref 0.00–0.07)
Basophils Absolute: 0 10*3/uL (ref 0.0–0.1)
Basophils Relative: 1 %
Eosinophils Absolute: 0 10*3/uL (ref 0.0–0.5)
Eosinophils Relative: 1 %
HCT: 19.6 % — ABNORMAL LOW (ref 36.0–46.0)
Hemoglobin: 6.5 g/dL — CL (ref 12.0–15.0)
Immature Granulocytes: 13 %
Lymphocytes Relative: 14 %
Lymphs Abs: 0.2 10*3/uL — ABNORMAL LOW (ref 0.7–4.0)
MCH: 33 pg (ref 26.0–34.0)
MCHC: 33.2 g/dL (ref 30.0–36.0)
MCV: 99.5 fL (ref 80.0–100.0)
Monocytes Absolute: 0.1 10*3/uL (ref 0.1–1.0)
Monocytes Relative: 4 %
Neutro Abs: 0.9 10*3/uL — ABNORMAL LOW (ref 1.7–7.7)
Neutrophils Relative %: 67 %
Platelets: 164 10*3/uL (ref 150–400)
RBC: 1.97 MIL/uL — ABNORMAL LOW (ref 3.87–5.11)
RDW: 17.2 % — ABNORMAL HIGH (ref 11.5–15.5)
WBC: 1.4 10*3/uL — CL (ref 4.0–10.5)
nRBC: 0 % (ref 0.0–0.2)

## 2019-01-07 LAB — PREPARE RBC (CROSSMATCH)

## 2019-01-07 MED ORDER — SODIUM CHLORIDE 0.9% FLUSH
10.0000 mL | INTRAVENOUS | Status: AC | PRN
  Administered 2019-01-07: 10 mL

## 2019-01-07 MED ORDER — DIPHENHYDRAMINE HCL 25 MG PO CAPS
25.0000 mg | ORAL_CAPSULE | Freq: Once | ORAL | Status: AC
  Administered 2019-01-07: 25 mg via ORAL
  Filled 2019-01-07: qty 1

## 2019-01-07 MED ORDER — HEPARIN SOD (PORK) LOCK FLUSH 100 UNIT/ML IV SOLN
500.0000 [IU] | Freq: Once | INTRAVENOUS | Status: AC
  Administered 2019-01-07: 500 [IU] via INTRAVENOUS

## 2019-01-07 MED ORDER — ACETAMINOPHEN 325 MG PO TABS: 650.0000 mg | ORAL_TABLET | Freq: Once | ORAL | Status: DC

## 2019-01-07 MED ORDER — SODIUM CHLORIDE 0.9% IV SOLUTION
250.0000 mL | Freq: Once | INTRAVENOUS | Status: AC
  Administered 2019-01-07: 11:00:00 250 mL via INTRAVENOUS

## 2019-01-07 NOTE — Progress Notes (Signed)
PICC line dressing changed per policy.  Insertion site clean and dry with no drainage, tenderness, redness or complaints of pain at site.  Lab work drawn with good flush.  No complaints of pain with flush.  Patient had dialysis today and stated after dialysis she was fatigued, weak, and having some SOB that is not normal for her.  Dr. Delton Coombes notified and CBCd drawn.

## 2019-01-07 NOTE — Progress Notes (Signed)
Messaged Dr. Delton Coombes via messenger and reported pt's complaints of blood noted when she has a BM. Reported patient states, " I have had hemorrhoids for years." Pt receiving 1 unit of blood today. Pt denies any abdominal pain or but notes blood when she wipes and in toilet after BM.   1 unit of PRBC;s  given today per MD orders. Tolerated infusion without adverse affects. Vital signs stable. No complaints at this time. Discharged from clinic via wheel chair.  F/U with [facility] as scheduled.

## 2019-01-07 NOTE — Progress Notes (Signed)
CRITICAL VALUE ALERT  Critical Value:  WBC 1.4; Hgb 6.5  Date & Time Notied:  01/07/2019 at 1042  Provider Notified: Dr. Delton Coombes  Orders Received/Actions taken: transfuse 1 unit PRBC

## 2019-01-07 NOTE — Patient Instructions (Signed)
[facility]  Discharge Instructions:   _______________________________________________________________  Thank you for choosing [facility] at [facility] to provide your oncology and hematology care.  To afford each patient quality time with our providers, please arrive at least 15 minutes before your scheduled appointment.  You need to re-schedule your appointment if you arrive 10 or more minutes late.  We strive to give you quality time with our providers, and arriving late affects you and other patients whose appointments are after yours.  Also, if you no show three or more times for appointments you may be dismissed from the clinic.  Again, thank you for choosing [facility] at [facility]. Our hope is that these requests will allow you access to exceptional care and in a timely manner. _______________________________________________________________  If you have questions after your visit, please contact our office at [phone] between the hours of 8:30 a.m. and 5:00 p.m. Voicemails left after 4:30 p.m. will not be returned until the following business day. _______________________________________________________________  For prescription refill requests, have your pharmacy contact our office. _______________________________________________________________  Recommendations made by the consultant and any test results will be sent to your referring physician. _______________________________________________________________ 

## 2019-01-08 LAB — TYPE AND SCREEN
ABO/RH(D): B POS
Antibody Screen: NEGATIVE
Unit division: 0

## 2019-01-08 LAB — BPAM RBC
Blood Product Expiration Date: 202006242359
ISSUE DATE / TIME: 202005261208
Unit Type and Rh: 5100

## 2019-01-09 DIAGNOSIS — E119 Type 2 diabetes mellitus without complications: Secondary | ICD-10-CM | POA: Diagnosis not present

## 2019-01-09 DIAGNOSIS — D631 Anemia in chronic kidney disease: Secondary | ICD-10-CM | POA: Diagnosis not present

## 2019-01-09 DIAGNOSIS — N186 End stage renal disease: Secondary | ICD-10-CM | POA: Diagnosis not present

## 2019-01-09 DIAGNOSIS — N2581 Secondary hyperparathyroidism of renal origin: Secondary | ICD-10-CM | POA: Diagnosis not present

## 2019-01-09 DIAGNOSIS — E1129 Type 2 diabetes mellitus with other diabetic kidney complication: Secondary | ICD-10-CM | POA: Diagnosis not present

## 2019-01-10 ENCOUNTER — Other Ambulatory Visit: Payer: Self-pay

## 2019-01-10 ENCOUNTER — Encounter (HOSPITAL_COMMUNITY): Payer: Self-pay

## 2019-01-10 ENCOUNTER — Inpatient Hospital Stay (HOSPITAL_COMMUNITY): Payer: Medicare Other

## 2019-01-10 VITALS — BP 158/81 | HR 86 | Temp 97.8°F | Resp 18

## 2019-01-10 DIAGNOSIS — I959 Hypotension, unspecified: Secondary | ICD-10-CM | POA: Diagnosis not present

## 2019-01-10 DIAGNOSIS — Z170 Estrogen receptor positive status [ER+]: Secondary | ICD-10-CM | POA: Diagnosis not present

## 2019-01-10 DIAGNOSIS — D649 Anemia, unspecified: Secondary | ICD-10-CM | POA: Diagnosis not present

## 2019-01-10 DIAGNOSIS — C50211 Malignant neoplasm of upper-inner quadrant of right female breast: Secondary | ICD-10-CM | POA: Diagnosis not present

## 2019-01-10 DIAGNOSIS — K219 Gastro-esophageal reflux disease without esophagitis: Secondary | ICD-10-CM | POA: Diagnosis not present

## 2019-01-10 DIAGNOSIS — Z5111 Encounter for antineoplastic chemotherapy: Secondary | ICD-10-CM | POA: Diagnosis not present

## 2019-01-10 MED ORDER — HEPARIN SOD (PORK) LOCK FLUSH 100 UNIT/ML IV SOLN
500.0000 [IU] | Freq: Once | INTRAVENOUS | Status: AC
  Administered 2019-01-10: 500 [IU] via INTRAVENOUS

## 2019-01-10 MED ORDER — SODIUM CHLORIDE 0.9% FLUSH
10.0000 mL | Freq: Once | INTRAVENOUS | Status: AC
  Administered 2019-01-10: 10 mL via INTRAVENOUS

## 2019-01-10 NOTE — Progress Notes (Signed)
PICC line flushed with no complaints voiced.  Dressing intact.  Insertion site clean and dry with biodisc intact.  VSs with discharge and left ambulatory with no s/s of distress noted.

## 2019-01-11 DIAGNOSIS — E119 Type 2 diabetes mellitus without complications: Secondary | ICD-10-CM | POA: Diagnosis not present

## 2019-01-11 DIAGNOSIS — N2581 Secondary hyperparathyroidism of renal origin: Secondary | ICD-10-CM | POA: Diagnosis not present

## 2019-01-11 DIAGNOSIS — D631 Anemia in chronic kidney disease: Secondary | ICD-10-CM | POA: Diagnosis not present

## 2019-01-11 DIAGNOSIS — N186 End stage renal disease: Secondary | ICD-10-CM | POA: Diagnosis not present

## 2019-01-11 DIAGNOSIS — E1129 Type 2 diabetes mellitus with other diabetic kidney complication: Secondary | ICD-10-CM | POA: Diagnosis not present

## 2019-01-12 ENCOUNTER — Other Ambulatory Visit: Payer: Self-pay | Admitting: Neurology

## 2019-01-13 ENCOUNTER — Encounter (HOSPITAL_COMMUNITY): Payer: Self-pay | Admitting: Hematology

## 2019-01-13 ENCOUNTER — Other Ambulatory Visit: Payer: Self-pay

## 2019-01-13 ENCOUNTER — Inpatient Hospital Stay (HOSPITAL_BASED_OUTPATIENT_CLINIC_OR_DEPARTMENT_OTHER): Payer: Medicare Other | Admitting: Hematology

## 2019-01-13 ENCOUNTER — Inpatient Hospital Stay (HOSPITAL_COMMUNITY): Payer: Medicare Other | Attending: Hematology

## 2019-01-13 VITALS — BP 173/85 | HR 79 | Temp 98.1°F | Resp 18 | Wt 169.2 lb

## 2019-01-13 DIAGNOSIS — N2889 Other specified disorders of kidney and ureter: Secondary | ICD-10-CM

## 2019-01-13 DIAGNOSIS — Z7982 Long term (current) use of aspirin: Secondary | ICD-10-CM

## 2019-01-13 DIAGNOSIS — M8580 Other specified disorders of bone density and structure, unspecified site: Secondary | ICD-10-CM | POA: Insufficient documentation

## 2019-01-13 DIAGNOSIS — Z170 Estrogen receptor positive status [ER+]: Secondary | ICD-10-CM | POA: Diagnosis not present

## 2019-01-13 DIAGNOSIS — E1140 Type 2 diabetes mellitus with diabetic neuropathy, unspecified: Secondary | ICD-10-CM | POA: Insufficient documentation

## 2019-01-13 DIAGNOSIS — Z79899 Other long term (current) drug therapy: Secondary | ICD-10-CM

## 2019-01-13 DIAGNOSIS — G629 Polyneuropathy, unspecified: Secondary | ICD-10-CM | POA: Diagnosis not present

## 2019-01-13 DIAGNOSIS — M316 Other giant cell arteritis: Secondary | ICD-10-CM | POA: Diagnosis not present

## 2019-01-13 DIAGNOSIS — C50411 Malignant neoplasm of upper-outer quadrant of right female breast: Secondary | ICD-10-CM | POA: Diagnosis not present

## 2019-01-13 DIAGNOSIS — I129 Hypertensive chronic kidney disease with stage 1 through stage 4 chronic kidney disease, or unspecified chronic kidney disease: Secondary | ICD-10-CM | POA: Insufficient documentation

## 2019-01-13 DIAGNOSIS — C50211 Malignant neoplasm of upper-inner quadrant of right female breast: Secondary | ICD-10-CM | POA: Diagnosis not present

## 2019-01-13 DIAGNOSIS — E785 Hyperlipidemia, unspecified: Secondary | ICD-10-CM

## 2019-01-13 DIAGNOSIS — E119 Type 2 diabetes mellitus without complications: Secondary | ICD-10-CM | POA: Diagnosis not present

## 2019-01-13 DIAGNOSIS — N186 End stage renal disease: Secondary | ICD-10-CM | POA: Insufficient documentation

## 2019-01-13 DIAGNOSIS — D649 Anemia, unspecified: Secondary | ICD-10-CM

## 2019-01-13 DIAGNOSIS — Z992 Dependence on renal dialysis: Secondary | ICD-10-CM | POA: Diagnosis not present

## 2019-01-13 DIAGNOSIS — E1129 Type 2 diabetes mellitus with other diabetic kidney complication: Secondary | ICD-10-CM | POA: Diagnosis not present

## 2019-01-13 DIAGNOSIS — D631 Anemia in chronic kidney disease: Secondary | ICD-10-CM | POA: Diagnosis not present

## 2019-01-13 DIAGNOSIS — N2581 Secondary hyperparathyroidism of renal origin: Secondary | ICD-10-CM | POA: Diagnosis not present

## 2019-01-13 DIAGNOSIS — N040 Nephrotic syndrome with minor glomerular abnormality: Secondary | ICD-10-CM | POA: Diagnosis not present

## 2019-01-13 DIAGNOSIS — D509 Iron deficiency anemia, unspecified: Secondary | ICD-10-CM | POA: Diagnosis not present

## 2019-01-13 LAB — COMPREHENSIVE METABOLIC PANEL
ALT: 14 U/L (ref 0–44)
AST: 14 U/L — ABNORMAL LOW (ref 15–41)
Albumin: 3.2 g/dL — ABNORMAL LOW (ref 3.5–5.0)
Alkaline Phosphatase: 87 U/L (ref 38–126)
Anion gap: 16 — ABNORMAL HIGH (ref 5–15)
BUN: 39 mg/dL — ABNORMAL HIGH (ref 6–20)
CO2: 24 mmol/L (ref 22–32)
Calcium: 9.1 mg/dL (ref 8.9–10.3)
Chloride: 101 mmol/L (ref 98–111)
Creatinine, Ser: 9.04 mg/dL — ABNORMAL HIGH (ref 0.44–1.00)
GFR calc Af Amer: 5 mL/min — ABNORMAL LOW (ref >60)
GFR calc non Af Amer: 4 mL/min — ABNORMAL LOW (ref >60)
Glucose, Bld: 120 mg/dL — ABNORMAL HIGH (ref 70–99)
Potassium: 4.3 mmol/L (ref 3.5–5.1)
Sodium: 141 mmol/L (ref 135–145)
Total Bilirubin: 0.4 mg/dL (ref 0.3–1.2)
Total Protein: 6.6 g/dL (ref 6.5–8.1)

## 2019-01-13 LAB — CBC WITH DIFFERENTIAL/PLATELET
Abs Immature Granulocytes: 1.15 10*3/uL — ABNORMAL HIGH (ref 0.00–0.07)
Basophils Absolute: 0.1 10*3/uL (ref 0.0–0.1)
Basophils Relative: 1 %
Eosinophils Absolute: 0 10*3/uL (ref 0.0–0.5)
Eosinophils Relative: 0 %
HCT: 23.8 % — ABNORMAL LOW (ref 36.0–46.0)
Hemoglobin: 7.5 g/dL — ABNORMAL LOW (ref 12.0–15.0)
Immature Granulocytes: 9 %
Lymphocytes Relative: 10 %
Lymphs Abs: 1.2 10*3/uL (ref 0.7–4.0)
MCH: 32.3 pg (ref 26.0–34.0)
MCHC: 31.5 g/dL (ref 30.0–36.0)
MCV: 102.6 fL — ABNORMAL HIGH (ref 80.0–100.0)
Monocytes Absolute: 1.2 10*3/uL — ABNORMAL HIGH (ref 0.1–1.0)
Monocytes Relative: 10 %
Neutro Abs: 8.6 10*3/uL — ABNORMAL HIGH (ref 1.7–7.7)
Neutrophils Relative %: 70 %
Platelets: 174 10*3/uL (ref 150–400)
RBC: 2.32 MIL/uL — ABNORMAL LOW (ref 3.87–5.11)
RDW: 17.6 % — ABNORMAL HIGH (ref 11.5–15.5)
WBC: 12.4 10*3/uL — ABNORMAL HIGH (ref 4.0–10.5)
nRBC: 0.2 % (ref 0.0–0.2)

## 2019-01-13 MED ORDER — SODIUM CHLORIDE 0.9% FLUSH
10.0000 mL | Freq: Once | INTRAVENOUS | Status: AC
  Administered 2019-01-13: 10 mL via INTRAVENOUS

## 2019-01-13 MED ORDER — HEPARIN SOD (PORK) LOCK FLUSH 100 UNIT/ML IV SOLN
250.0000 [IU] | Freq: Once | INTRAVENOUS | Status: AC
  Administered 2019-01-13: 250 [IU] via INTRAVENOUS

## 2019-01-13 NOTE — Progress Notes (Signed)
1110 verbal order received to remove PICC line. PICC line removed without incident. See IV assessment for details. Pt observed for 30 minutes post removal. Discharged self ambulatory in satisfactory condition.

## 2019-01-13 NOTE — Assessment & Plan Note (Addendum)
1.  Stage Ib (T1CN0) right breast cancer: -Screening mammogram on 02/18/2018 suggests right breast mass.  Right breast additional views on 02/26/2018 showed a 0.5 x 0.3 x 0.4 cm mass in the right breast.  Ultrasound biopsy was recommended. -Right breast biopsy on 03/05/2018 shows benign breast tissue with areas of dense hyalin fibrosis. -Right breast mammogram on 08/20/2018 shows suspicious mass in the 1 o'clock position of the right breast with no adenopathy.  Left breast mammogram on 08/27/2018 was BI-RADS Category 1. -Right breast biopsy on 08/27/2018 at 1:00 consistent with infiltrating ductal carcinoma. 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  ER 50% positive, PR negative, HER-2 negative, Ki 67-60%. - Oncotype DX recurrence score-47. Distant recurrence risk at 9 years with tamoxifen alone is 36%.  Absolute chemotherapy benefit is more than 15% with her recurrent score. - RT-PCR analysis of ER expression of the specimen shows ER was negative.  However ER was 50% positive by IHC. -Adjuvant chemotherapy with 4 cycles of AC from 10/30/2018 through 01/01/2019. - She received 1 unit of PRBC on 01/07/2019 for hemoglobin of 6.5.  We will check her labs today to see if she needs any transfusion.  We will discontinue the PICC line today. - We will schedule her for a DEXA scan.  I will see her back in 2 weeks for follow-up.  I plan to start her on aromatase inhibitor therapy.  2.  ESRD on HD: -She has been hemodialysis for the past 1 and half year. -ESRD thought to be secondary to hypertension.  3.  Neuropathy: -She had neuropathy in the past year, etiology unknown.  She has burning sensation in the bottom of both feet, predominantly in the toes.  No neuropathic symptoms in the hands. -She is currently on Cymbalta and gabapentin.

## 2019-01-13 NOTE — Addendum Note (Signed)
Addended by: Vena Austria on: 01/13/2019 10:01 AM   Modules accepted: Orders

## 2019-01-13 NOTE — Progress Notes (Signed)
[place], [place] [address]   CLINIC:  Medical Oncology/Hematology  PCP:  [name], MD [address] [place] [address] [phone]   REASON FOR VISIT:  Follow-up for breast cancer    BRIEF ONCOLOGIC HISTORY:    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[facility])   09/18/2018 Initial Diagnosis    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[facility])    09/26/2018 Cancer Staging    Staging form: Breast, AJCC 8th Edition - Clinical stage from 09/26/2018: Stage IB (cT1c, cN0, cM0, G3, ER+, PR-, HER2-) - Signed by Derek Jack, MD on 09/26/2018    10/30/2018 -  Chemotherapy    The patient had DOXOrubicin (ADRIAMYCIN) chemo injection 108 mg, 60 mg/m2 = 108 mg, Intravenous,  Once, 4 of 4 cycles Administration: 108 mg (10/30/2018), 108 mg (11/20/2018), 108 mg (12/11/2018), 108 mg (01/01/2019) palonosetron (ALOXI) injection 0.25 mg, 0.25 mg, Intravenous,  Once, 4 of 4 cycles Administration: 0.25 mg (10/30/2018), 0.25 mg (11/20/2018), 0.25 mg (12/11/2018), 0.25 mg (01/01/2019) pegfilgrastim (NEULASTA ONPRO KIT) injection 6 mg, 6 mg, Subcutaneous, Once, 4 of 4 cycles Administration: 6 mg (10/30/2018), 6 mg (11/20/2018), 6 mg (12/11/2018), 6 mg (01/01/2019) cyclophosphamide (CYTOXAN) 820 mg in sodium chloride 0.9 % 250 mL chemo infusion, 450 mg/m2 = 820 mg (75 % of original dose 600 mg/m2), Intravenous,  Once, 4 of 4 cycles Dose modification: 450 mg/m2 (75 % of original dose 600 mg/m2, Cycle 1, Reason: Other (see comments), Comment: hemodialysis) Administration: 820 mg (10/30/2018), 820 mg (11/20/2018), 820 mg (12/11/2018), 820 mg (01/01/2019) fosaprepitant (EMEND) 150 mg, dexamethasone (DECADRON) 12 mg in sodium chloride 0.9 % 145 mL IVPB, , Intravenous,  Once, 4 of 4 cycles Administration:  (10/30/2018),  (11/20/2018),  (12/11/2018),  (01/01/2019)  for chemotherapy treatment.       CANCER STAGING: Cancer  Staging Malignant neoplasm of upper-inner quadrant of right female breast [facility]) Staging form: Breast, AJCC 8th Edition - Clinical stage from 09/26/2018: Stage IB (cT1c, cN0, cM0, G3, ER+, PR-, HER2-) - Signed by Derek Jack, MD on 09/26/2018    INTERVAL HISTORY:  Megan [name] 58 y.o. female for follow-up.  She received cycle 4 on 01/01/2019.  She had received blood transfusion on 01/07/2019 for hemoglobin of 6.5.  Her energy levels are okay at this time.  Denied any chemotherapy related side effects including nausea, vomiting or diarrhea.  No bleeding reported.  No mucositis.  Denies any fevers or new onset cough.  Appetite and energy levels are at 50%.    REVIEW OF SYSTEMS:  Review of Systems  Constitutional: Positive for fatigue.  All other systems reviewed and are negative.    PAST MEDICAL/SURGICAL HISTORY:  Past Medical History:  Diagnosis Date  . Anemia   . Blood transfusion without reported diagnosis   . Cataract   . CKD (chronic kidney disease) stage 3, GFR 30-59 ml/min (HCC)   . Essential hypertension, benign   . GERD (gastroesophageal reflux disease)   . Headache   . Hemorrhoids   . Mixed hyperlipidemia   . PONV (postoperative nausea and vomiting)   . Renal insufficiency   . S/P colonoscopy Jan 2011   Dr. Benson Norway: sessile polyp (benign lymphoid), large hemorrhoids, repeat 5-10 years  . Temporal arteritis ([place])   . Type 2 diabetes mellitus ([facility])   . Wears glasses    Past Surgical History:  Procedure Laterality Date  . ABDOMINAL HYSTERECTOMY    .  APPENDECTOMY    . ARTERY BIOPSY N/A 05/09/2018   Procedure: RIGHT TEMPORAL ARTERY BIOPSY;  Surgeon: Judeth Horn, MD;  Location: [facility];  Service: General;  Laterality: N/A;  . CATARACT EXTRACTION W/PHACO Left 02/09/2017   Procedure: CATARACT EXTRACTION PHACO AND INTRAOCULAR LENS PLACEMENT LEFT EYE;  Surgeon: Tonny Branch, MD;  Location: [facility];  Service: Ophthalmology;  Laterality: Left;  CDE: 4.89  . CATARACT EXTRACTION  W/PHACO Right 06/04/2017   Procedure: CATARACT EXTRACTION PHACO AND INTRAOCULAR LENS PLACEMENT (IOC);  Surgeon: Tonny Branch, MD;  Location: [facility];  Service: Ophthalmology;  Laterality: Right;  CDE: 4.12  . CHOLECYSTECTOMY  09/29/2011   Procedure: LAPAROSCOPIC CHOLECYSTECTOMY;  Surgeon: Jamesetta So, MD;  Location: [facility];  Service: General;  Laterality: N/A;  . COLONOSCOPY  Jan 2011   Dr. Benson Norway: sessile polyp (benign lymphoid), large hemorrhoids, repeat 5-10 years  . COLONOSCOPY N/A 06/12/2016   Procedure: COLONOSCOPY;  Surgeon: Daneil Dolin, MD;  [facility];  Service: Endoscopy;  Laterality: N/A;  1230   . ESOPHAGOGASTRODUODENOSCOPY  09/05/2011   CNO:BSJGG hiatal hernia; remainder of exam normal. No explanation for patient's abdominal pain with today's examination  . ESOPHAGOGASTRODUODENOSCOPY N/A 12/17/2013   Dr. [name]: gastric erythema, erosion, mild chronic inflammation on path   . LAPAROSCOPIC APPENDECTOMY  09/29/2011   Procedure: APPENDECTOMY LAPAROSCOPIC;  Surgeon: Jamesetta So, MD;  Location: [facility];  Service: General;;  incidental appendectomy  . PARTIAL MASTECTOMY WITH NEEDLE LOCALIZATION AND AXILLARY SENTINEL LYMPH NODE BX Right 09/18/2018   Procedure: RIGHT PARTIAL MASTECTOMY AFTER NEEDLE LOCALIZATION, SENTINEL LYMPH NODE BIOPSY RIGHT AXILLA;  Surgeon: Aviva Signs, MD;  Location: [facility];  Service: General;  Laterality: Right;     SOCIAL HISTORY:  Social History   Socioeconomic History  . Marital status: Married    Spouse name: Not on file  . Number of children: Not on file  . Years of education: Not on file  . Highest education level: Not on file  Occupational History  . Occupation: Merchandiser, retail: [employer] # [phone]  Social Needs  . Financial resource strain: Not hard at all  . Food insecurity:    Worry: Never true    Inability: Never true  . Transportation needs:    Medical: No    Non-medical: No  Tobacco Use  . Smoking status: Never Smoker   . Smokeless tobacco: Never Used  Substance and Sexual Activity  . Alcohol use: No  . Drug use: No  . Sexual activity: Yes    Birth control/protection: Surgical  Lifestyle  . Physical activity:    Days per week: 0 days    Minutes per session: 0 min  . Stress: Not at all  Relationships  . Social connections:    Talks on phone: Twice a week    Gets together: Twice a week    Attends religious service: More than 4 times per year    Active member of club or organization: No    Attends meetings of clubs or organizations: Never    Relationship status: Married  . Intimate partner violence:    Fear of current or ex partner: No    Emotionally abused: No    Physically abused: No    Forced sexual activity: No  Other Topics Concern  . Not on file  Social History Narrative   Works at [employer] in [place].    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  Also has to get items from high shelves-causes achy pain in shoulder area      Married.   Children are grown, out of house.    FAMILY HISTORY:  Family History  Problem Relation Age of Onset  . Hypertension Mother   . Coronary artery disease Mother   . Diabetes Mother   . Hypertension Sister   . Coronary artery disease Sister   . Hypertension Brother   . Heart attack Father   . Hypertension Son   . Heart attack Maternal Aunt   . Hypertension Maternal Aunt   . Diabetes Maternal Aunt   . Heart attack Maternal Uncle   . Hypertension Maternal Uncle   . Diabetes Maternal Uncle   . Heart attack Paternal Aunt   . Hypertension Paternal Aunt   . Diabetes Paternal Aunt   . Heart attack Paternal Uncle   . Hypertension Paternal Uncle   . Diabetes Paternal Uncle   . Heart attack Maternal Grandmother   . Heart attack Maternal Grandfather   . Heart attack Paternal Grandmother   . Heart attack Paternal Grandfather   . Colon cancer Neg Hx     CURRENT MEDICATIONS:  Outpatient Encounter Medications as of 01/13/2019  Medication  Sig  . amLODipine (NORVASC) 5 MG tablet Take 1 tablet (5 mg total) by mouth daily. (Patient taking differently: Take 5 mg by mouth daily as needed (blood pressure 159 or above). )  . aspirin EC 81 MG tablet Take 81 mg by mouth daily.  Marland Kitchen atorvastatin (LIPITOR) 20 MG tablet Take 1 tablet (20 mg total) by mouth daily. (Patient taking differently: Take 20 mg by mouth at bedtime. )  . B Complex-C-Folic Acid (DIALYVITE 010) 0.8 MG TABS Take 800 mg by mouth daily.   . butalbital-acetaminophen-caffeine (FIORICET, ESGIC) 50-325-40 MG tablet Take 1 tablet by mouth every 6 (six) hours as needed for headache.  . cinacalcet (SENSIPAR) 30 MG tablet Take 2 tablets (60 mg total) by mouth daily. (Patient taking differently: Take 30 mg by mouth daily. )  . DULoxetine (CYMBALTA) 30 MG capsule Take 1 capsule (30 mg total) by mouth daily.  Marland Kitchen gabapentin (NEURONTIN) 300 MG capsule TAKE 1 CAPSULE(300 MG) BY MOUTH AT BEDTIME  . HYDROcodone-acetaminophen (NORCO) 5-325 MG tablet Take 1 tablet by mouth every 4 (four) hours as needed for moderate pain.  Marland Kitchen lanthanum (FOSRENOL) 1000 MG chewable tablet Chew 1,000-2,000 mg by mouth See admin instructions. Take 2000 mg by mouth twice daily with meals and 1000 with a snack  . lidocaine-prilocaine (EMLA) cream APPLY SMALL AMOUNT TO ACCESS SITE (AVF) 1 TO 2 HOURS BEFORE DIALYSIS. COVER WITH OCCLUSIVE DRESSING (SARAN WRAP)  . losartan (COZAAR) 50 MG tablet Take 1 tablet (50 mg total) by mouth daily. (Patient taking differently: Take 50 mg by mouth at bedtime. )  . nebivolol (BYSTOLIC) 10 MG tablet Take 1 tablet (10 mg total) by mouth 2 (two) times daily. (Patient taking differently: Take 10 mg by mouth 3 (three) times a week. Take on the days of dialysis Tuesdays, Thursdays, and Saturdays)  . ondansetron (ZOFRAN) 4 MG tablet Take 1 tablet (4 mg total) by mouth every 8 (eight) hours as needed for nausea or vomiting.  . pantoprazole (PROTONIX) 40 MG tablet TAKE 1 TABLET(40 MG) BY MOUTH  DAILY (Patient taking differently: Take 40 mg by mouth daily before breakfast. )  . prochlorperazine (COMPAZINE) 10 MG tablet Take 1 tablet (10 mg total) by mouth every 6 (six) hours as needed (Nausea or  vomiting).  Marland Kitchen tiZANidine (ZANAFLEX) 4 MG capsule TAKE 1 CAPSULE(4 MG) BY MOUTH TWICE DAILY WITH A MEAL. START 1 CAPSULE AT NIGHT FOR 2 WEEKS AND THEN TWICE DAILY  . [EXPIRED] heparin lock flush 100 unit/mL   . [EXPIRED] sodium chloride flush (NS) 0.9 % injection 10 mL    No facility-administered encounter medications on file as of 01/13/2019.     ALLERGIES:  No Known Allergies   PHYSICAL EXAM:  ECOG Performance status: 1  Vitals:   01/13/19 0903  BP: (!) 173/85  Pulse: 79  Resp: 18  Temp: 98.1 F (36.7 C)  SpO2: 100%   Filed Weights   01/13/19 0903  Weight: 169 lb 3.2 oz (76.7 kg)    Physical Exam Vitals signs reviewed.  Constitutional:      Appearance: Normal appearance.  Cardiovascular:     Rate and Rhythm: Normal rate and regular rhythm.     Heart sounds: Normal heart sounds.  Pulmonary:     Effort: Pulmonary effort is normal.     Breath sounds: Normal breath sounds.  Abdominal:     General: There is no distension.     Palpations: Abdomen is soft. There is no mass.  Skin:    General: Skin is warm.  Neurological:     General: No focal deficit present.     Mental Status: She is alert and oriented to person, place, and time.  Psychiatric:        Mood and Affect: Mood normal.        Behavior: Behavior normal.      LABORATORY DATA:  I have reviewed the labs as listed.  CBC    Component Value Date/Time   WBC 1.4 (LL) 01/07/2019 1028   RBC 1.97 (L) 01/07/2019 1028   HGB 6.5 (LL) 01/07/2019 1028   HCT 19.6 (L) 01/07/2019 1028   PLT 164 01/07/2019 1028   MCV 99.5 01/07/2019 1028   MCH 33.0 01/07/2019 1028   MCHC 33.2 01/07/2019 1028   RDW 17.2 (H) 01/07/2019 1028   LYMPHSABS 0.2 (L) 01/07/2019 1028   MONOABS 0.1 01/07/2019 1028   EOSABS 0.0 01/07/2019  1028   BASOSABS 0.0 01/07/2019 1028   CMP Latest Ref Rng & Units 01/01/2019 12/12/2018 12/11/2018  Glucose 70 - 99 mg/dL 126(H) 149(H) 107(H)  BUN 6 - 20 mg/dL 32(H) 27(H) 35(H)  Creatinine 0.44 - 1.00 mg/dL 7.22(H) 4.59(H) 7.06(H)  Sodium 135 - 145 mmol/L 138 136 138  Potassium 3.5 - 5.1 mmol/L 4.1 3.8 3.5  Chloride 98 - 111 mmol/L 99 96(L) 98  CO2 22 - 32 mmol/L _0 Calcium 8.9 - 10.3 mg/dL 9.0 8.9 8.7(L)  Total Protein 6.5 - 8.1 g/dL 7.0 - 7.2  Total Bilirubin 0.3 - 1.2 mg/dL 0.5 - 0.5  Alkaline Phos 38 - 126 U/L 65 - 122  AST 15 - 41 U/L 12(L) - 17  ALT 0 - 44 U/L 15 - 20       DIAGNOSTIC IMAGING:  I have independently reviewed the scans and discussed with the patient.   I have reviewed Venita Lick LPN's note and agree with the documentation.  I personally performed a face-to-face visit, made revisions and my assessment and plan is as follows.    ASSESSMENT & PLAN:   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[place]) 1.  Stage Ib (T1CN0) right breast cancer: -Screening mammogram on 02/18/2018 suggests right breast mass.  Right breast additional views on 02/26/2018 showed a  0.5 x 0.3 x 0.4 cm mass in the right breast.  Ultrasound biopsy was recommended. -Right breast biopsy on 03/05/2018 shows benign breast tissue with areas of dense hyalin fibrosis. -Right breast mammogram on 08/20/2018 shows suspicious mass in the 1 o'clock position of the right breast with no adenopathy.  Left breast mammogram on 08/27/2018 was BI-RADS Category 1. -Right breast biopsy on 08/27/2018 at 1:00 consistent with infiltrating ductal carcinoma. 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  ER 50% positive, PR negative, HER-2 negative, Ki 67-60%. - Oncotype DX recurrence score-47. Distant recurrence risk at 9 years with tamoxifen alone is 36%.  Absolute chemotherapy benefit  is more than 15% with her recurrent score. - RT-PCR analysis of ER expression of the specimen shows ER was negative.  However ER was 50% positive by IHC. -Adjuvant chemotherapy with 4 cycles of AC from 10/30/2018 through 01/01/2019. - She received 1 unit of PRBC on 01/07/2019 for hemoglobin of 6.5.  We will check her labs today to see if she needs any transfusion.  We will discontinue the PICC line today. - We will schedule her for a DEXA scan.  I will see her back in 2 weeks for follow-up.  I plan to start her on aromatase inhibitor therapy.  2.  ESRD on HD: -She has been hemodialysis for the past 1 and half year. -ESRD thought to be secondary to hypertension.  3.  Neuropathy: -She had neuropathy in the past year, etiology unknown.  She has burning sensation in the bottom of both feet, predominantly in the toes.  No neuropathic symptoms in the hands. -She is currently on Cymbalta and gabapentin.   Total time spent is 25 minutes with more than 50% of the time spent face-to-face discussing treatment plan and coordination of care.    Orders placed this encounter:  Orders Placed This Encounter  Procedures  . CBC with Differential/Platelet  . Comprehensive metabolic panel      Derek Jack, MD [place] [phone]

## 2019-01-13 NOTE — Patient Instructions (Addendum)
[facility] at [facility] Discharge Instructions  You were seen today by Dr. Katragadda. He went over your recent lab results. He will see you back as scheduled for labs and follow up.   Thank you for choosing [facility] at [facility] to provide your oncology and hematology care.  To afford each patient quality time with our provider, please arrive at least 15 minutes before your scheduled appointment time.   If you have a lab appointment with the Cancer Center please come in thru the  Main Entrance and check in at the main information desk  You need to re-schedule your appointment should you arrive 10 or more minutes late.  We strive to give you quality time with our providers, and arriving late affects you and other patients whose appointments are after yours.  Also, if you no show three or more times for appointments you may be dismissed from the clinic at the providers discretion.     Again, thank you for choosing [facility].  Our hope is that these requests will decrease the amount of time that you wait before being seen by our physicians.       _____________________________________________________________  Should you have questions after your visit to [facility], please contact our office at [phone] between the hours of 8:00 a.m. and 4:30 p.m.  Voicemails left after 4:00 p.m. will not be returned until the following business day.  For prescription refill requests, have your pharmacy contact our office and allow 72 hours.    Cancer Center Support Programs:   > Cancer Support Group  2nd Tuesday of the month 1pm-2pm, Journey Room    

## 2019-01-14 ENCOUNTER — Other Ambulatory Visit: Payer: Self-pay

## 2019-01-14 DIAGNOSIS — N186 End stage renal disease: Secondary | ICD-10-CM | POA: Diagnosis not present

## 2019-01-14 DIAGNOSIS — D509 Iron deficiency anemia, unspecified: Secondary | ICD-10-CM | POA: Diagnosis not present

## 2019-01-14 DIAGNOSIS — E119 Type 2 diabetes mellitus without complications: Secondary | ICD-10-CM | POA: Diagnosis not present

## 2019-01-14 DIAGNOSIS — E1129 Type 2 diabetes mellitus with other diabetic kidney complication: Secondary | ICD-10-CM | POA: Diagnosis not present

## 2019-01-14 DIAGNOSIS — N2581 Secondary hyperparathyroidism of renal origin: Secondary | ICD-10-CM | POA: Diagnosis not present

## 2019-01-14 DIAGNOSIS — D631 Anemia in chronic kidney disease: Secondary | ICD-10-CM | POA: Diagnosis not present

## 2019-01-14 MED ORDER — TIZANIDINE HCL 4 MG PO CAPS: ORAL_CAPSULE | ORAL | 0 refills | Status: DC

## 2019-01-16 DIAGNOSIS — E119 Type 2 diabetes mellitus without complications: Secondary | ICD-10-CM | POA: Diagnosis not present

## 2019-01-16 DIAGNOSIS — N2581 Secondary hyperparathyroidism of renal origin: Secondary | ICD-10-CM | POA: Diagnosis not present

## 2019-01-16 DIAGNOSIS — N186 End stage renal disease: Secondary | ICD-10-CM | POA: Diagnosis not present

## 2019-01-16 DIAGNOSIS — D631 Anemia in chronic kidney disease: Secondary | ICD-10-CM | POA: Diagnosis not present

## 2019-01-16 DIAGNOSIS — D509 Iron deficiency anemia, unspecified: Secondary | ICD-10-CM | POA: Diagnosis not present

## 2019-01-16 DIAGNOSIS — E1129 Type 2 diabetes mellitus with other diabetic kidney complication: Secondary | ICD-10-CM | POA: Diagnosis not present

## 2019-01-17 ENCOUNTER — Encounter (HOSPITAL_COMMUNITY): Payer: Medicare Other

## 2019-01-18 DIAGNOSIS — D631 Anemia in chronic kidney disease: Secondary | ICD-10-CM | POA: Diagnosis not present

## 2019-01-18 DIAGNOSIS — E119 Type 2 diabetes mellitus without complications: Secondary | ICD-10-CM | POA: Diagnosis not present

## 2019-01-18 DIAGNOSIS — N2581 Secondary hyperparathyroidism of renal origin: Secondary | ICD-10-CM | POA: Diagnosis not present

## 2019-01-18 DIAGNOSIS — E1129 Type 2 diabetes mellitus with other diabetic kidney complication: Secondary | ICD-10-CM | POA: Diagnosis not present

## 2019-01-18 DIAGNOSIS — N186 End stage renal disease: Secondary | ICD-10-CM | POA: Diagnosis not present

## 2019-01-18 DIAGNOSIS — D509 Iron deficiency anemia, unspecified: Secondary | ICD-10-CM | POA: Diagnosis not present

## 2019-01-20 ENCOUNTER — Other Ambulatory Visit: Payer: Self-pay

## 2019-01-20 ENCOUNTER — Ambulatory Visit (HOSPITAL_COMMUNITY)
Admission: RE | Admit: 2019-01-20 | Discharge: 2019-01-20 | Disposition: A | Discharge status: Home / Self Care | Payer: Medicare Other | Source: Ambulatory Visit | Attending: Hematology | Admitting: Hematology

## 2019-01-20 ENCOUNTER — Encounter (HOSPITAL_COMMUNITY): Payer: Medicare Other

## 2019-01-20 DIAGNOSIS — Z170 Estrogen receptor positive status [ER+]: Secondary | ICD-10-CM | POA: Insufficient documentation

## 2019-01-20 DIAGNOSIS — Z780 Asymptomatic menopausal state: Secondary | ICD-10-CM | POA: Diagnosis not present

## 2019-01-20 DIAGNOSIS — Z13820 Encounter for screening for osteoporosis: Secondary | ICD-10-CM | POA: Insufficient documentation

## 2019-01-20 DIAGNOSIS — M85852 Other specified disorders of bone density and structure, left thigh: Secondary | ICD-10-CM | POA: Insufficient documentation

## 2019-01-20 DIAGNOSIS — C50211 Malignant neoplasm of upper-inner quadrant of right female breast: Secondary | ICD-10-CM | POA: Insufficient documentation

## 2019-01-21 DIAGNOSIS — E1129 Type 2 diabetes mellitus with other diabetic kidney complication: Secondary | ICD-10-CM | POA: Diagnosis not present

## 2019-01-21 DIAGNOSIS — D631 Anemia in chronic kidney disease: Secondary | ICD-10-CM | POA: Diagnosis not present

## 2019-01-21 DIAGNOSIS — D509 Iron deficiency anemia, unspecified: Secondary | ICD-10-CM | POA: Diagnosis not present

## 2019-01-21 DIAGNOSIS — N186 End stage renal disease: Secondary | ICD-10-CM | POA: Diagnosis not present

## 2019-01-21 DIAGNOSIS — E119 Type 2 diabetes mellitus without complications: Secondary | ICD-10-CM | POA: Diagnosis not present

## 2019-01-21 DIAGNOSIS — N2581 Secondary hyperparathyroidism of renal origin: Secondary | ICD-10-CM | POA: Diagnosis not present

## 2019-01-23 DIAGNOSIS — D509 Iron deficiency anemia, unspecified: Secondary | ICD-10-CM | POA: Diagnosis not present

## 2019-01-23 DIAGNOSIS — N2581 Secondary hyperparathyroidism of renal origin: Secondary | ICD-10-CM | POA: Diagnosis not present

## 2019-01-23 DIAGNOSIS — N186 End stage renal disease: Secondary | ICD-10-CM | POA: Diagnosis not present

## 2019-01-23 DIAGNOSIS — E119 Type 2 diabetes mellitus without complications: Secondary | ICD-10-CM | POA: Diagnosis not present

## 2019-01-23 DIAGNOSIS — E1129 Type 2 diabetes mellitus with other diabetic kidney complication: Secondary | ICD-10-CM | POA: Diagnosis not present

## 2019-01-23 DIAGNOSIS — D631 Anemia in chronic kidney disease: Secondary | ICD-10-CM | POA: Diagnosis not present

## 2019-01-24 ENCOUNTER — Encounter (HOSPITAL_COMMUNITY): Payer: Medicare Other

## 2019-01-25 DIAGNOSIS — N2581 Secondary hyperparathyroidism of renal origin: Secondary | ICD-10-CM | POA: Diagnosis not present

## 2019-01-25 DIAGNOSIS — D509 Iron deficiency anemia, unspecified: Secondary | ICD-10-CM | POA: Diagnosis not present

## 2019-01-25 DIAGNOSIS — E119 Type 2 diabetes mellitus without complications: Secondary | ICD-10-CM | POA: Diagnosis not present

## 2019-01-25 DIAGNOSIS — D631 Anemia in chronic kidney disease: Secondary | ICD-10-CM | POA: Diagnosis not present

## 2019-01-25 DIAGNOSIS — E1129 Type 2 diabetes mellitus with other diabetic kidney complication: Secondary | ICD-10-CM | POA: Diagnosis not present

## 2019-01-25 DIAGNOSIS — N186 End stage renal disease: Secondary | ICD-10-CM | POA: Diagnosis not present

## 2019-01-27 ENCOUNTER — Encounter (HOSPITAL_COMMUNITY): Payer: Medicare Other

## 2019-01-28 ENCOUNTER — Other Ambulatory Visit: Payer: Self-pay

## 2019-01-28 DIAGNOSIS — N2581 Secondary hyperparathyroidism of renal origin: Secondary | ICD-10-CM | POA: Diagnosis not present

## 2019-01-28 DIAGNOSIS — N186 End stage renal disease: Secondary | ICD-10-CM | POA: Diagnosis not present

## 2019-01-28 DIAGNOSIS — E1129 Type 2 diabetes mellitus with other diabetic kidney complication: Secondary | ICD-10-CM | POA: Diagnosis not present

## 2019-01-28 DIAGNOSIS — D631 Anemia in chronic kidney disease: Secondary | ICD-10-CM | POA: Diagnosis not present

## 2019-01-28 DIAGNOSIS — D509 Iron deficiency anemia, unspecified: Secondary | ICD-10-CM | POA: Diagnosis not present

## 2019-01-28 DIAGNOSIS — E119 Type 2 diabetes mellitus without complications: Secondary | ICD-10-CM | POA: Diagnosis not present

## 2019-01-29 ENCOUNTER — Inpatient Hospital Stay (HOSPITAL_BASED_OUTPATIENT_CLINIC_OR_DEPARTMENT_OTHER): Payer: Medicare Other | Admitting: Hematology

## 2019-01-29 ENCOUNTER — Inpatient Hospital Stay (HOSPITAL_COMMUNITY): Payer: Medicare Other

## 2019-01-29 ENCOUNTER — Encounter (HOSPITAL_COMMUNITY): Payer: Self-pay | Admitting: Hematology

## 2019-01-29 DIAGNOSIS — C50211 Malignant neoplasm of upper-inner quadrant of right female breast: Secondary | ICD-10-CM

## 2019-01-29 DIAGNOSIS — G629 Polyneuropathy, unspecified: Secondary | ICD-10-CM

## 2019-01-29 DIAGNOSIS — E785 Hyperlipidemia, unspecified: Secondary | ICD-10-CM | POA: Diagnosis not present

## 2019-01-29 DIAGNOSIS — E1140 Type 2 diabetes mellitus with diabetic neuropathy, unspecified: Secondary | ICD-10-CM | POA: Diagnosis not present

## 2019-01-29 DIAGNOSIS — N2889 Other specified disorders of kidney and ureter: Secondary | ICD-10-CM

## 2019-01-29 DIAGNOSIS — M8580 Other specified disorders of bone density and structure, unspecified site: Secondary | ICD-10-CM

## 2019-01-29 DIAGNOSIS — Z7982 Long term (current) use of aspirin: Secondary | ICD-10-CM | POA: Diagnosis not present

## 2019-01-29 DIAGNOSIS — Z170 Estrogen receptor positive status [ER+]: Secondary | ICD-10-CM

## 2019-01-29 DIAGNOSIS — I129 Hypertensive chronic kidney disease with stage 1 through stage 4 chronic kidney disease, or unspecified chronic kidney disease: Secondary | ICD-10-CM

## 2019-01-29 DIAGNOSIS — M316 Other giant cell arteritis: Secondary | ICD-10-CM | POA: Diagnosis not present

## 2019-01-29 DIAGNOSIS — D649 Anemia, unspecified: Secondary | ICD-10-CM | POA: Diagnosis not present

## 2019-01-29 DIAGNOSIS — N186 End stage renal disease: Secondary | ICD-10-CM

## 2019-01-29 DIAGNOSIS — Z79899 Other long term (current) drug therapy: Secondary | ICD-10-CM

## 2019-01-29 LAB — CBC WITH DIFFERENTIAL/PLATELET
Abs Immature Granulocytes: 0.04 10*3/uL (ref 0.00–0.07)
Basophils Absolute: 0.1 10*3/uL (ref 0.0–0.1)
Basophils Relative: 1 %
Eosinophils Absolute: 0.3 10*3/uL (ref 0.0–0.5)
Eosinophils Relative: 3 %
HCT: 24.5 % — ABNORMAL LOW (ref 36.0–46.0)
Hemoglobin: 7.6 g/dL — ABNORMAL LOW (ref 12.0–15.0)
Immature Granulocytes: 0 %
Lymphocytes Relative: 13 %
Lymphs Abs: 1.2 10*3/uL (ref 0.7–4.0)
MCH: 32.6 pg (ref 26.0–34.0)
MCHC: 31 g/dL (ref 30.0–36.0)
MCV: 105.2 fL — ABNORMAL HIGH (ref 80.0–100.0)
Monocytes Absolute: 1.5 10*3/uL — ABNORMAL HIGH (ref 0.1–1.0)
Monocytes Relative: 17 %
Neutro Abs: 5.8 10*3/uL (ref 1.7–7.7)
Neutrophils Relative %: 66 %
Platelets: 386 10*3/uL (ref 150–400)
RBC: 2.33 MIL/uL — ABNORMAL LOW (ref 3.87–5.11)
RDW: 19.5 % — ABNORMAL HIGH (ref 11.5–15.5)
WBC: 8.9 10*3/uL (ref 4.0–10.5)
nRBC: 0 % (ref 0.0–0.2)

## 2019-01-29 LAB — COMPREHENSIVE METABOLIC PANEL
ALT: 11 U/L (ref 0–44)
AST: 13 U/L — ABNORMAL LOW (ref 15–41)
Albumin: 3.2 g/dL — ABNORMAL LOW (ref 3.5–5.0)
Alkaline Phosphatase: 65 U/L (ref 38–126)
Anion gap: 17 — ABNORMAL HIGH (ref 5–15)
BUN: 31 mg/dL — ABNORMAL HIGH (ref 6–20)
CO2: 27 mmol/L (ref 22–32)
Calcium: 9.2 mg/dL (ref 8.9–10.3)
Chloride: 97 mmol/L — ABNORMAL LOW (ref 98–111)
Creatinine, Ser: 7.13 mg/dL — ABNORMAL HIGH (ref 0.44–1.00)
GFR calc Af Amer: 7 mL/min — ABNORMAL LOW (ref >60)
GFR calc non Af Amer: 6 mL/min — ABNORMAL LOW (ref >60)
Glucose, Bld: 97 mg/dL (ref 70–99)
Potassium: 4 mmol/L (ref 3.5–5.1)
Sodium: 141 mmol/L (ref 135–145)
Total Bilirubin: 0.6 mg/dL (ref 0.3–1.2)
Total Protein: 6.9 g/dL (ref 6.5–8.1)

## 2019-01-29 MED ORDER — ANASTROZOLE 1 MG PO TABS: 1.0000 mg | ORAL_TABLET | Freq: Every day | ORAL | 4 refills | Status: DC

## 2019-01-29 NOTE — Assessment & Plan Note (Addendum)
1.  Stage Ib (T1CN0) right breast cancer, ER positive, PR and HER-2 negative: - Right breast biopsy on 08/27/2026 1:00 consistent with IDC. -Right lumpectomy on 09/18/1998 20-1.5 cm IDC, grade 3, associated high-grade DCIS, negative margins, 0/3 lymph nodes positive, ER 50%, PR negative and HER-2 negative, Ki-67 60%. -Oncotype DX recurrence score 47.  Distant recurrence at 9 years with tamoxifen alone is 36%.  Absolute chemotherapy benefit is more than 15%. - Adjuvant chemotherapy with 4 cycles of AC from 10/30/2018 through 01/01/2019. -Breast exam today did not reveal any palpable masses.  Right breast lumpectomy scar in the upper outer quadrant is well-healed.  No palpable adenopathy. - We talked about starting her on anastrozole for at least 5 years, potentially longer.  We talked about the side effects in detail. -She will come back in 4 months for follow-up.  She will have her next mammogram in January 2021. - She reportedly passed out after dialysis last week.  She was told to talk to her nephrologist.  Today blood pressure is within normal limits.  Hemoglobin is 7.6.  2.  Osteopenia: - Bone density on 01/20/2019 shows T score of -1.9. - She was told to take calcium and vitamin D twice daily. -I plan to repeat bone density test in 2 years.  3.  ESRD on HD: -She has been on hemodialysis for the past 1 and half year.  ESRD was thought to be secondary to hypertension.  4.  Neuropathy: -She has neuropathy in the past year, etiology unknown.  She has burning sensation in the bottom of the feet, predominantly in the toes.  No neuropathic symptoms in the hands.

## 2019-01-29 NOTE — Patient Instructions (Addendum)
[place] at [facility] Discharge Instructions  You were seen today by Dr. Delton Coombes. He went over your recent lab results. He will see you back in 4 months for labs and follow up.  Start taking calcium and vit D twice a day. He is also sending in a cancer pill called anastrazole, take this daily.  Thank you for choosing [place] at [facility] to provide your oncology and hematology care.  To afford each patient quality time with our provider, please arrive at least 15 minutes before your scheduled appointment time.   If you have a lab appointment with the [facility] please come in thru the  Main Entrance and check in at the main information desk  You need to re-schedule your appointment should you arrive 10 or more minutes late.  We strive to give you quality time with our providers, and arriving late affects you and other patients whose appointments are after yours.  Also, if you no show three or more times for appointments you may be dismissed from the clinic at the providers discretion.     Again, thank you for choosing [facility].  Our hope is that these requests will decrease the amount of time that you wait before being seen by our physicians.       _____________________________________________________________  Should you have questions after your visit to [facility], please contact our office at [phone] between the hours of 8:00 a.m. and 4:30 p.m.  Voicemails left after 4:00 p.m. will not be returned until the following business day.  For prescription refill requests, have your pharmacy contact our office and allow 72 hours.    Cancer Center Support Programs:   > Cancer Support Group  2nd Tuesday of the month 1pm-2pm, Journey Room

## 2019-01-29 NOTE — Progress Notes (Signed)
[address] [place], [place] [address]   CLINIC:  Medical Oncology/Hematology  PCP:  [name], MD [address] [place] [id number] [phone]   REASON FOR VISIT:  Follow-up for breast cancer    BRIEF ONCOLOGIC HISTORY:  Oncology History  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[facility])  09/18/2018 Initial Diagnosis   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[place])   09/26/2018 Cancer Staging   Staging form: Breast, AJCC 8th Edition - Clinical stage from 09/26/2018: Stage IB (cT1c, cN0, cM0, G3, ER+, PR-, HER2-) - Signed by Derek Jack, MD on 09/26/2018   10/30/2018 -  Chemotherapy   The patient had DOXOrubicin (ADRIAMYCIN) chemo injection 108 mg, 60 mg/m2 = 108 mg, Intravenous,  Once, 4 of 4 cycles Administration: 108 mg (10/30/2018), 108 mg (11/20/2018), 108 mg (12/11/2018), 108 mg (01/01/2019) palonosetron (ALOXI) injection 0.25 mg, 0.25 mg, Intravenous,  Once, 4 of 4 cycles Administration: 0.25 mg (10/30/2018), 0.25 mg (11/20/2018), 0.25 mg (12/11/2018), 0.25 mg (01/01/2019) pegfilgrastim (NEULASTA ONPRO KIT) injection 6 mg, 6 mg, Subcutaneous, Once, 4 of 4 cycles Administration: 6 mg (10/30/2018), 6 mg (11/20/2018), 6 mg (12/11/2018), 6 mg (01/01/2019) cyclophosphamide (CYTOXAN) 820 mg in sodium chloride 0.9 % 250 mL chemo infusion, 450 mg/m2 = 820 mg (75 % of original dose 600 mg/m2), Intravenous,  Once, 4 of 4 cycles Dose modification: 450 mg/m2 (75 % of original dose 600 mg/m2, Cycle 1, Reason: Other (see comments), Comment: hemodialysis) Administration: 820 mg (10/30/2018), 820 mg (11/20/2018), 820 mg (12/11/2018), 820 mg (01/01/2019) fosaprepitant (EMEND) 150 mg, dexamethasone (DECADRON) 12 mg in sodium chloride 0.9 % 145 mL IVPB, , Intravenous,  Once, 4 of 4 cycles Administration:  (10/30/2018),  (11/20/2018),  (12/11/2018),  (01/01/2019)  for chemotherapy treatment.       CANCER STAGING:  Cancer Staging Malignant neoplasm of upper-inner quadrant of right female breast [facility]) Staging form: Breast, AJCC 8th Edition - Clinical stage from 09/26/2018: Stage IB (cT1c, cN0, cM0, G3, ER+, PR-, HER2-) - Signed by Derek Jack, MD on 09/26/2018    INTERVAL HISTORY:  Ms. [place] 58 y.o. female is seen for follow-up of her breast cancer.  She finished 4 cycles of chemotherapy on 01/01/2019.  Her energy levels are still 50% although her appetite is 100%.  She has shortness of breath on exertion at times.  She reportedly passed out last week after dialysis.  Today her blood pressure is 146/79.  Neuropathy in the feet with burning sensation has been stable.  She occasionally feels lightheaded.  Denies any fevers or infections.  No ER visits or hospitalizations.  Denies any bleeding per rectum or melena.  REVIEW OF SYSTEMS:  Review of Systems  Respiratory: Positive for shortness of breath.   Neurological: Positive for dizziness and numbness.  All other systems reviewed and are negative.    PAST MEDICAL/SURGICAL HISTORY:  Past Medical History:  Diagnosis Date  . Anemia   . Blood transfusion without reported diagnosis   . Cataract   . CKD (chronic kidney disease) stage 3, GFR 30-59 ml/min (HCC)   . Essential hypertension, benign   . GERD (gastroesophageal reflux disease)   . Headache   . Hemorrhoids   . Mixed hyperlipidemia   . PONV (postoperative nausea and vomiting)   . Renal insufficiency   . S/P colonoscopy Jan 2011   Dr. Benson Norway: sessile polyp (benign lymphoid), large hemorrhoids, repeat 5-10 years  . Temporal arteritis (  [facility])   . Type 2 diabetes mellitus ([facility])   . Wears glasses    Past Surgical History:  Procedure Laterality Date  . ABDOMINAL HYSTERECTOMY    . APPENDECTOMY    . ARTERY BIOPSY N/A 05/09/2018   Procedure: RIGHT TEMPORAL ARTERY BIOPSY;  Surgeon: Judeth Horn, MD;  Location: [facility];  Service: General;  Laterality: N/A;  . CATARACT EXTRACTION W/PHACO Left  02/09/2017   Procedure: CATARACT EXTRACTION PHACO AND INTRAOCULAR LENS PLACEMENT LEFT EYE;  Surgeon: Tonny Branch, MD;  Location: [facility];  Service: Ophthalmology;  Laterality: Left;  CDE: 4.89  . CATARACT EXTRACTION W/PHACO Right 06/04/2017   Procedure: CATARACT EXTRACTION PHACO AND INTRAOCULAR LENS PLACEMENT (IOC);  Surgeon: Tonny Branch, MD;  Location: [facility];  Service: Ophthalmology;  Laterality: Right;  CDE: 4.12  . CHOLECYSTECTOMY  09/29/2011   Procedure: LAPAROSCOPIC CHOLECYSTECTOMY;  Surgeon: Jamesetta So, MD;  Location: [facility];  Service: General;  Laterality: N/A;  . COLONOSCOPY  Jan 2011   Dr. Benson Norway: sessile polyp (benign lymphoid), large hemorrhoids, repeat 5-10 years  . COLONOSCOPY N/A 06/12/2016   Procedure: COLONOSCOPY;  Surgeon: Daneil Dolin, MD;  [facility];  Service: Endoscopy;  Laterality: N/A;  1230   . ESOPHAGOGASTRODUODENOSCOPY  09/05/2011   ALP:FXTKW hiatal hernia; remainder of exam normal. No explanation for patient's abdominal pain with today's examination  . ESOPHAGOGASTRODUODENOSCOPY N/A 12/17/2013   Dr. [name]: gastric erythema, erosion, mild chronic inflammation on path   . LAPAROSCOPIC APPENDECTOMY  09/29/2011   Procedure: APPENDECTOMY LAPAROSCOPIC;  Surgeon: Jamesetta So, MD;  Location: [facility];  Service: General;;  incidental appendectomy  . PARTIAL MASTECTOMY WITH NEEDLE LOCALIZATION AND AXILLARY SENTINEL LYMPH NODE BX Right 09/18/2018   Procedure: RIGHT PARTIAL MASTECTOMY AFTER NEEDLE LOCALIZATION, SENTINEL LYMPH NODE BIOPSY RIGHT AXILLA;  Surgeon: Aviva Signs, MD;  Location: [facility];  Service: General;  Laterality: Right;     SOCIAL HISTORY:  Social History   Socioeconomic History  . Marital status: Married    Spouse name: Not on file  . Number of children: Not on file  . Years of education: Not on file  . Highest education level: Not on file  Occupational History  . Occupation: Merchandiser, retail: [employer] # [phone]  Social Needs  .  Financial resource strain: Not hard at all  . Food insecurity    Worry: Never true    Inability: Never true  . Transportation needs    Medical: No    Non-medical: No  Tobacco Use  . Smoking status: Never Smoker  . Smokeless tobacco: Never Used  Substance and Sexual Activity  . Alcohol use: No  . Drug use: No  . Sexual activity: Yes    Birth control/protection: Surgical  Lifestyle  . Physical activity    Days per week: 0 days    Minutes per session: 0 min  . Stress: Not at all  Relationships  . Social Herbalist on phone: Twice a week    Gets together: Twice a week    Attends religious service: More than 4 times per year    Active member of club or organization: No    Attends meetings of clubs or organizations: Never    Relationship status: Married  . Intimate partner violence    Fear of current or ex partner: No    Emotionally abused: No    Physically abused: No    Forced sexual activity: No  Other Topics  Concern  . Not on file  Social History Narrative   Works at [employer] in [place].    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   Also has to get items from high shelves-causes achy pain in shoulder area      Married.   Children are grown, out of house.    FAMILY HISTORY:  Family History  Problem Relation Age of Onset  . Hypertension Mother   . Coronary artery disease Mother   . Diabetes Mother   . Hypertension Sister   . Coronary artery disease Sister   . Hypertension Brother   . Heart attack Father   . Hypertension Son   . Heart attack Maternal Aunt   . Hypertension Maternal Aunt   . Diabetes Maternal Aunt   . Heart attack Maternal Uncle   . Hypertension Maternal Uncle   . Diabetes Maternal Uncle   . Heart attack Paternal Aunt   . Hypertension Paternal Aunt   . Diabetes Paternal Aunt   . Heart attack Paternal Uncle   . Hypertension Paternal Uncle   . Diabetes Paternal Uncle   . Heart attack Maternal Grandmother   . Heart  attack Maternal Grandfather   . Heart attack Paternal Grandmother   . Heart attack Paternal Grandfather   . Colon cancer Neg Hx     CURRENT MEDICATIONS:  Outpatient Encounter Medications as of 01/29/2019  Medication Sig  . amLODipine (NORVASC) 5 MG tablet Take 1 tablet (5 mg total) by mouth daily. (Patient taking differently: Take 5 mg by mouth daily as needed (blood pressure 159 or above). )  . aspirin EC 81 MG tablet Take 81 mg by mouth daily.  Marland Kitchen atorvastatin (LIPITOR) 20 MG tablet Take 1 tablet (20 mg total) by mouth daily. (Patient taking differently: Take 20 mg by mouth at bedtime. )  . B Complex-C-Folic Acid (DIALYVITE 542) 0.8 MG TABS Take 800 mg by mouth daily.   . butalbital-acetaminophen-caffeine (FIORICET, ESGIC) 50-325-40 MG tablet Take 1 tablet by mouth every 6 (six) hours as needed for headache.  . cinacalcet (SENSIPAR) 30 MG tablet Take 2 tablets (60 mg total) by mouth daily. (Patient taking differently: Take 30 mg by mouth daily. )  . DULoxetine (CYMBALTA) 30 MG capsule Take 1 capsule (30 mg total) by mouth daily.  Marland Kitchen gabapentin (NEURONTIN) 300 MG capsule TAKE 1 CAPSULE(300 MG) BY MOUTH AT BEDTIME  . HYDROcodone-acetaminophen (NORCO) 5-325 MG tablet Take 1 tablet by mouth every 4 (four) hours as needed for moderate pain.  Marland Kitchen lanthanum (FOSRENOL) 1000 MG chewable tablet Chew 1,000-2,000 mg by mouth See admin instructions. Take 2000 mg by mouth twice daily with meals and 1000 with a snack  . lidocaine-prilocaine (EMLA) cream APPLY SMALL AMOUNT TO ACCESS SITE (AVF) 1 TO 2 HOURS BEFORE DIALYSIS. COVER WITH OCCLUSIVE DRESSING (SARAN WRAP)  . losartan (COZAAR) 50 MG tablet Take 1 tablet (50 mg total) by mouth daily. (Patient taking differently: Take 50 mg by mouth at bedtime. )  . nebivolol (BYSTOLIC) 10 MG tablet Take 1 tablet (10 mg total) by mouth 2 (two) times daily. (Patient taking differently: Take 10 mg by mouth 3 (three) times a week. Take on the days of dialysis Tuesdays,  Thursdays, and Saturdays)  . ondansetron (ZOFRAN) 4 MG tablet Take 1 tablet (4 mg total) by mouth every 8 (eight) hours as needed for nausea or vomiting.  . pantoprazole (PROTONIX) 40 MG tablet TAKE 1 TABLET(40 MG) BY MOUTH DAILY (Patient  taking differently: Take 40 mg by mouth daily before breakfast. )  . prochlorperazine (COMPAZINE) 10 MG tablet Take 1 tablet (10 mg total) by mouth every 6 (six) hours as needed (Nausea or vomiting).  Marland Kitchen tiZANidine (ZANAFLEX) 4 MG capsule TAKE 1 CAPSULE(4 MG) BY MOUTH TWICE DAILY WITH A MEAL. START 1 CAPSULE AT NIGHT FOR 2 WEEKS AND THEN TWICE DAILY  . anastrozole (ARIMIDEX) 1 MG tablet Take 1 tablet (1 mg total) by mouth daily.  . [DISCONTINUED] B Complex-C-Zn-Folic Acid (DIALYVITE [id number]) 0.8 MG TABS Take 1 tablet by mouth daily.   No facility-administered encounter medications on file as of 01/29/2019.     ALLERGIES:  No Known Allergies   PHYSICAL EXAM:  ECOG Performance status: 1  Vitals:   01/29/19 1000  BP: (!) 146/79  Pulse: 86  Resp: 16  Temp: 98.4 F (36.9 C)  SpO2: 99%   Filed Weights   01/29/19 1000  Weight: 165 lb 5 oz (75 kg)    Physical Exam Vitals signs reviewed.  Constitutional:      Appearance: Normal appearance.  Cardiovascular:     Rate and Rhythm: Normal rate and regular rhythm.     Heart sounds: Normal heart sounds.  Pulmonary:     Effort: Pulmonary effort is normal.     Breath sounds: Normal breath sounds.  Abdominal:     General: There is no distension.     Palpations: Abdomen is soft. There is no mass.  Skin:    General: Skin is warm.  Neurological:     General: No focal deficit present.     Mental Status: She is alert and oriented to person, place, and time.  Psychiatric:        Mood and Affect: Mood normal.        Behavior: Behavior normal.    Breast exam: Right breast lumpectomy scar in the upper outer quadrant is well-healed.  No palpable mass in bilateral breast.  No palpable adenopathy.   LABORATORY DATA:  I have reviewed the labs as listed.  CBC    Component Value Date/Time   WBC 8.9 01/29/2019 1013   RBC 2.33 (L) 01/29/2019 1013   HGB 7.6 (L) 01/29/2019 1013   HCT 24.5 (L) 01/29/2019 1013   PLT 386 01/29/2019 1013   MCV 105.2 (H) 01/29/2019 1013   MCH 32.6 01/29/2019 1013   MCHC 31.0 01/29/2019 1013   RDW 19.5 (H) 01/29/2019 1013   LYMPHSABS 1.2 01/29/2019 1013   MONOABS 1.5 (H) 01/29/2019 1013   EOSABS 0.3 01/29/2019 1013   BASOSABS 0.1 01/29/2019 1013   CMP Latest Ref Rng & Units 01/29/2019 01/13/2019 01/01/2019  Glucose 70 - 99 mg/dL 97 120(H) 126(H)  BUN 6 - 20 mg/dL 31(H) 39(H) 32(H)  Creatinine 0.44 - 1.00 mg/dL 7.13(H) 9.04(H) 7.22(H)  Sodium 135 - 145 mmol/L 141 141 138  Potassium 3.5 - 5.1 mmol/L 4.0 4.3 4.1  Chloride 98 - 111 mmol/L 97(L) 101 99  CO2 22 - 32 mmol/L 27 24 27   Calcium 8.9 - 10.3 mg/dL 9.2 9.1 9.0  Total Protein 6.5 - 8.1 g/dL 6.9 6.6 7.0  Total Bilirubin 0.3 - 1.2 mg/dL 0.6 0.4 0.5  Alkaline Phos 38 - 126 U/L 65 87 65  AST 15 - 41 U/L 13(L) 14(L) 12(L)  ALT 0 - 44 U/L 11 14 15        DIAGNOSTIC IMAGING:  I have independently reviewed the scans and discussed with the patient.   I have reviewed  Venita Lick LPN's note and agree with the documentation.  I personally performed a face-to-face visit, made revisions and my assessment and plan is as follows.    ASSESSMENT & PLAN:   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) 1.  Stage Ib (T1CN0) right breast cancer, ER positive, PR and HER-2 negative: - Right breast biopsy on 08/27/2026 1:00 consistent with IDC. -Right lumpectomy on 09/18/1998 20-1.5 cm IDC, grade 3, associated high-grade DCIS, negative margins, 0/3 lymph nodes positive, ER 50%, PR negative and HER-2 negative, Ki-67 60%. -Oncotype DX recurrence score 47.  Distant recurrence at 9 years with tamoxifen alone is 36%.  Absolute chemotherapy benefit is more than 15%. - Adjuvant chemotherapy with 4  cycles of AC from 10/30/2018 through 01/01/2019. -Breast exam today did not reveal any palpable masses.  Right breast lumpectomy scar in the upper outer quadrant is well-healed.  No palpable adenopathy. - We talked about starting her on anastrozole for at least 5 years, potentially longer.  We talked about the side effects in detail. -She will come back in 4 months for follow-up.  She will have her next mammogram in January 2021. - She reportedly passed out after dialysis last week.  She was told to talk to her nephrologist.  Today blood pressure is within normal limits.  Hemoglobin is 7.6.  2.  Osteopenia: - Bone density on 01/20/2019 shows T score of -1.9. - She was told to take calcium and vitamin D twice daily. -I plan to repeat bone density test in 2 years.  3.  ESRD on HD: -She has been on hemodialysis for the past 1 and half year.  ESRD was thought to be secondary to hypertension.  4.  Neuropathy: -She has neuropathy in the past year, etiology unknown.  She has burning sensation in the bottom of the feet, predominantly in the toes.  No neuropathic symptoms in the hands.  Total time spent is 40 minutes with more than 50% of the time spent face-to-face discussing treatment plan and coordination of care.    Orders placed this encounter:  Orders Placed This Encounter  Procedures  . CBC with Differential/Platelet  . Comprehensive metabolic panel  . Vitamin D 25 hydroxy      Derek Jack, MD [place] [phone]

## 2019-01-30 DIAGNOSIS — N2581 Secondary hyperparathyroidism of renal origin: Secondary | ICD-10-CM | POA: Diagnosis not present

## 2019-01-30 DIAGNOSIS — E119 Type 2 diabetes mellitus without complications: Secondary | ICD-10-CM | POA: Diagnosis not present

## 2019-01-30 DIAGNOSIS — D509 Iron deficiency anemia, unspecified: Secondary | ICD-10-CM | POA: Diagnosis not present

## 2019-01-30 DIAGNOSIS — N186 End stage renal disease: Secondary | ICD-10-CM | POA: Diagnosis not present

## 2019-01-30 DIAGNOSIS — E1129 Type 2 diabetes mellitus with other diabetic kidney complication: Secondary | ICD-10-CM | POA: Diagnosis not present

## 2019-01-30 DIAGNOSIS — D631 Anemia in chronic kidney disease: Secondary | ICD-10-CM | POA: Diagnosis not present

## 2019-01-31 ENCOUNTER — Encounter (HOSPITAL_COMMUNITY): Payer: Medicare Other

## 2019-02-01 DIAGNOSIS — E119 Type 2 diabetes mellitus without complications: Secondary | ICD-10-CM | POA: Diagnosis not present

## 2019-02-01 DIAGNOSIS — D631 Anemia in chronic kidney disease: Secondary | ICD-10-CM | POA: Diagnosis not present

## 2019-02-01 DIAGNOSIS — D509 Iron deficiency anemia, unspecified: Secondary | ICD-10-CM | POA: Diagnosis not present

## 2019-02-01 DIAGNOSIS — N186 End stage renal disease: Secondary | ICD-10-CM | POA: Diagnosis not present

## 2019-02-01 DIAGNOSIS — N2581 Secondary hyperparathyroidism of renal origin: Secondary | ICD-10-CM | POA: Diagnosis not present

## 2019-02-01 DIAGNOSIS — E1129 Type 2 diabetes mellitus with other diabetic kidney complication: Secondary | ICD-10-CM | POA: Diagnosis not present

## 2019-02-02 ENCOUNTER — Other Ambulatory Visit: Payer: Self-pay

## 2019-02-02 ENCOUNTER — Observation Stay (HOSPITAL_COMMUNITY)
Admission: EM | Admit: 2019-02-02 | Discharge: 2019-02-04 | Disposition: A | Discharge status: Home / Self Care | Payer: Medicare Other | Attending: Internal Medicine | Admitting: Internal Medicine

## 2019-02-02 DIAGNOSIS — R0600 Dyspnea, unspecified: Secondary | ICD-10-CM | POA: Diagnosis not present

## 2019-02-02 DIAGNOSIS — Z7982 Long term (current) use of aspirin: Secondary | ICD-10-CM | POA: Diagnosis not present

## 2019-02-02 DIAGNOSIS — E119 Type 2 diabetes mellitus without complications: Secondary | ICD-10-CM

## 2019-02-02 DIAGNOSIS — I132 Hypertensive heart and chronic kidney disease with heart failure and with stage 5 chronic kidney disease, or end stage renal disease: Secondary | ICD-10-CM | POA: Insufficient documentation

## 2019-02-02 DIAGNOSIS — Z20828 Contact with and (suspected) exposure to other viral communicable diseases: Secondary | ICD-10-CM | POA: Diagnosis not present

## 2019-02-02 DIAGNOSIS — J9601 Acute respiratory failure with hypoxia: Secondary | ICD-10-CM | POA: Diagnosis present

## 2019-02-02 DIAGNOSIS — I5031 Acute diastolic (congestive) heart failure: Secondary | ICD-10-CM | POA: Diagnosis not present

## 2019-02-02 DIAGNOSIS — D649 Anemia, unspecified: Secondary | ICD-10-CM

## 2019-02-02 DIAGNOSIS — I10 Essential (primary) hypertension: Secondary | ICD-10-CM | POA: Diagnosis present

## 2019-02-02 DIAGNOSIS — N186 End stage renal disease: Secondary | ICD-10-CM | POA: Diagnosis present

## 2019-02-02 DIAGNOSIS — R7989 Other specified abnormal findings of blood chemistry: Secondary | ICD-10-CM | POA: Diagnosis present

## 2019-02-02 DIAGNOSIS — E876 Hypokalemia: Secondary | ICD-10-CM | POA: Diagnosis present

## 2019-02-02 DIAGNOSIS — R0602 Shortness of breath: Secondary | ICD-10-CM | POA: Diagnosis not present

## 2019-02-02 DIAGNOSIS — Z992 Dependence on renal dialysis: Secondary | ICD-10-CM | POA: Insufficient documentation

## 2019-02-02 DIAGNOSIS — Z79899 Other long term (current) drug therapy: Secondary | ICD-10-CM | POA: Insufficient documentation

## 2019-02-02 DIAGNOSIS — R778 Other specified abnormalities of plasma proteins: Secondary | ICD-10-CM

## 2019-02-02 DIAGNOSIS — J811 Chronic pulmonary edema: Secondary | ICD-10-CM | POA: Diagnosis not present

## 2019-02-02 HISTORY — DX: End stage renal disease: N18.6

## 2019-02-02 HISTORY — DX: Dependence on renal dialysis: Z99.2

## 2019-02-02 NOTE — ED Triage Notes (Signed)
Pt reports SOB x 2 weeks that got worse tonight. Saturday at dialysis pt was told her Hbg was 7.4, pt was instructed at that time to come to ED if symptoms got worse. Pt finished chemo tx on 01/01/19. Pt reports dry cough and chest pains when coughing.

## 2019-02-03 ENCOUNTER — Observation Stay (HOSPITAL_BASED_OUTPATIENT_CLINIC_OR_DEPARTMENT_OTHER): Payer: Medicare Other

## 2019-02-03 ENCOUNTER — Encounter (HOSPITAL_COMMUNITY): Payer: Self-pay | Admitting: Internal Medicine

## 2019-02-03 ENCOUNTER — Emergency Department (HOSPITAL_COMMUNITY): Payer: Medicare Other

## 2019-02-03 DIAGNOSIS — I5031 Acute diastolic (congestive) heart failure: Secondary | ICD-10-CM | POA: Diagnosis present

## 2019-02-03 DIAGNOSIS — J9601 Acute respiratory failure with hypoxia: Secondary | ICD-10-CM | POA: Diagnosis present

## 2019-02-03 DIAGNOSIS — R0600 Dyspnea, unspecified: Secondary | ICD-10-CM

## 2019-02-03 DIAGNOSIS — R0602 Shortness of breath: Secondary | ICD-10-CM | POA: Diagnosis not present

## 2019-02-03 DIAGNOSIS — N186 End stage renal disease: Secondary | ICD-10-CM | POA: Diagnosis not present

## 2019-02-03 DIAGNOSIS — E876 Hypokalemia: Secondary | ICD-10-CM | POA: Diagnosis present

## 2019-02-03 DIAGNOSIS — J811 Chronic pulmonary edema: Secondary | ICD-10-CM | POA: Diagnosis not present

## 2019-02-03 DIAGNOSIS — R7989 Other specified abnormal findings of blood chemistry: Secondary | ICD-10-CM

## 2019-02-03 DIAGNOSIS — I340 Nonrheumatic mitral (valve) insufficiency: Secondary | ICD-10-CM

## 2019-02-03 DIAGNOSIS — I361 Nonrheumatic tricuspid (valve) insufficiency: Secondary | ICD-10-CM | POA: Diagnosis not present

## 2019-02-03 DIAGNOSIS — R778 Other specified abnormalities of plasma proteins: Secondary | ICD-10-CM | POA: Diagnosis present

## 2019-02-03 LAB — COMPREHENSIVE METABOLIC PANEL
ALT: 31 U/L (ref 0–44)
AST: 34 U/L (ref 15–41)
Albumin: 3.3 g/dL — ABNORMAL LOW (ref 3.5–5.0)
Alkaline Phosphatase: 88 U/L (ref 38–126)
Anion gap: 13 (ref 5–15)
BUN: 41 mg/dL — ABNORMAL HIGH (ref 6–20)
CO2: 25 mmol/L (ref 22–32)
Calcium: 9.2 mg/dL (ref 8.9–10.3)
Chloride: 99 mmol/L (ref 98–111)
Creatinine, Ser: 8.16 mg/dL — ABNORMAL HIGH (ref 0.44–1.00)
GFR calc Af Amer: 6 mL/min — ABNORMAL LOW (ref >60)
GFR calc non Af Amer: 5 mL/min — ABNORMAL LOW (ref >60)
Glucose, Bld: 117 mg/dL — ABNORMAL HIGH (ref 70–99)
Potassium: 3.8 mmol/L (ref 3.5–5.1)
Sodium: 137 mmol/L (ref 135–145)
Total Bilirubin: 0.5 mg/dL (ref 0.3–1.2)
Total Protein: 7 g/dL (ref 6.5–8.1)

## 2019-02-03 LAB — CBC
HCT: 24.9 % — ABNORMAL LOW (ref 36.0–46.0)
Hemoglobin: 7.4 g/dL — ABNORMAL LOW (ref 12.0–15.0)
MCH: 31.2 pg (ref 26.0–34.0)
MCHC: 29.7 g/dL — ABNORMAL LOW (ref 30.0–36.0)
MCV: 105.1 fL — ABNORMAL HIGH (ref 80.0–100.0)
Platelets: 402 10*3/uL — ABNORMAL HIGH (ref 150–400)
RBC: 2.37 MIL/uL — ABNORMAL LOW (ref 3.87–5.11)
RDW: 18.6 % — ABNORMAL HIGH (ref 11.5–15.5)
WBC: 13.2 10*3/uL — ABNORMAL HIGH (ref 4.0–10.5)
nRBC: 0 % (ref 0.0–0.2)

## 2019-02-03 LAB — BASIC METABOLIC PANEL
Anion gap: 9 (ref 5–15)
BUN: 39 mg/dL — ABNORMAL HIGH (ref 6–20)
CO2: 28 mmol/L (ref 22–32)
Calcium: 9 mg/dL (ref 8.9–10.3)
Chloride: 101 mmol/L (ref 98–111)
Creatinine, Ser: 7.97 mg/dL — ABNORMAL HIGH (ref 0.44–1.00)
GFR calc Af Amer: 6 mL/min — ABNORMAL LOW (ref >60)
GFR calc non Af Amer: 5 mL/min — ABNORMAL LOW (ref >60)
Glucose, Bld: 108 mg/dL — ABNORMAL HIGH (ref 70–99)
Potassium: 3.4 mmol/L — ABNORMAL LOW (ref 3.5–5.1)
Sodium: 138 mmol/L (ref 135–145)

## 2019-02-03 LAB — CBC WITH DIFFERENTIAL/PLATELET
Abs Immature Granulocytes: 0.04 10*3/uL (ref 0.00–0.07)
Basophils Absolute: 0.1 10*3/uL (ref 0.0–0.1)
Basophils Relative: 1 %
Eosinophils Absolute: 0.4 10*3/uL (ref 0.0–0.5)
Eosinophils Relative: 4 %
HCT: 23 % — ABNORMAL LOW (ref 36.0–46.0)
Hemoglobin: 7 g/dL — ABNORMAL LOW (ref 12.0–15.0)
Immature Granulocytes: 0 %
Lymphocytes Relative: 14 %
Lymphs Abs: 1.4 10*3/uL (ref 0.7–4.0)
MCH: 31.4 pg (ref 26.0–34.0)
MCHC: 30.4 g/dL (ref 30.0–36.0)
MCV: 103.1 fL — ABNORMAL HIGH (ref 80.0–100.0)
Monocytes Absolute: 1.6 10*3/uL — ABNORMAL HIGH (ref 0.1–1.0)
Monocytes Relative: 16 %
Neutro Abs: 6.8 10*3/uL (ref 1.7–7.7)
Neutrophils Relative %: 65 %
Platelets: 333 10*3/uL (ref 150–400)
RBC: 2.23 MIL/uL — ABNORMAL LOW (ref 3.87–5.11)
RDW: 18.7 % — ABNORMAL HIGH (ref 11.5–15.5)
WBC: 10.3 10*3/uL (ref 4.0–10.5)
nRBC: 0 % (ref 0.0–0.2)

## 2019-02-03 LAB — ECHOCARDIOGRAM COMPLETE
Height: 62 in
Weight: 2640 oz

## 2019-02-03 LAB — GLUCOSE, CAPILLARY
Glucose-Capillary: 87 mg/dL (ref 70–99)
Glucose-Capillary: 103 mg/dL — ABNORMAL HIGH (ref 70–99)
Glucose-Capillary: 92 mg/dL (ref 70–99)

## 2019-02-03 LAB — SARS CORONAVIRUS 2 BY RT PCR (HOSPITAL ORDER, PERFORMED IN ~~LOC~~ HOSPITAL LAB): SARS Coronavirus 2: NEGATIVE

## 2019-02-03 LAB — TROPONIN I
Troponin I: 0.09 ng/mL (ref <0.03)
Troponin I: 0.11 ng/mL (ref <0.03)
Troponin I: 0.11 ng/mL (ref <0.03)
Troponin I: 0.08 ng/mL (ref <0.03)

## 2019-02-03 LAB — CBG MONITORING, ED: Glucose-Capillary: 105 mg/dL — ABNORMAL HIGH (ref 70–99)

## 2019-02-03 LAB — TSH: TSH: 1.837 u[IU]/mL (ref 0.350–4.500)

## 2019-02-03 LAB — PREPARE RBC (CROSSMATCH)

## 2019-02-03 LAB — BRAIN NATRIURETIC PEPTIDE: B Natriuretic Peptide: 2334 pg/mL — ABNORMAL HIGH (ref 0.0–100.0)

## 2019-02-03 MED ORDER — PANTOPRAZOLE SODIUM 40 MG PO TBEC
40.0000 mg | DELAYED_RELEASE_TABLET | Freq: Every day | ORAL | Status: DC
  Administered 2019-02-04: 40 mg via ORAL
  Filled 2019-02-03: qty 1

## 2019-02-03 MED ORDER — ATORVASTATIN CALCIUM 20 MG PO TABS
20.0000 mg | ORAL_TABLET | Freq: Every day | ORAL | Status: DC
  Administered 2019-02-03: 20 mg via ORAL
  Filled 2019-02-03: qty 1

## 2019-02-03 MED ORDER — SODIUM CHLORIDE 0.9% IV SOLUTION
Freq: Once | INTRAVENOUS | Status: AC
  Administered 2019-02-03: 07:00:00 via INTRAVENOUS

## 2019-02-03 MED ORDER — AMLODIPINE BESYLATE 5 MG PO TABS: 5.0000 mg | ORAL_TABLET | Freq: Every day | ORAL | Status: DC | PRN

## 2019-02-03 MED ORDER — SODIUM CHLORIDE 0.9% FLUSH
3.0000 mL | INTRAVENOUS | Status: DC | PRN
  Administered 2019-02-03: 23:00:00 3 mL via INTRAVENOUS
  Filled 2019-02-03: qty 3

## 2019-02-03 MED ORDER — ASPIRIN EC 81 MG PO TBEC
81.0000 mg | DELAYED_RELEASE_TABLET | Freq: Every day | ORAL | Status: DC
  Administered 2019-02-03 – 2019-02-04 (×2): 81 mg via ORAL
  Filled 2019-02-03 (×2): qty 1

## 2019-02-03 MED ORDER — DULOXETINE HCL 30 MG PO CPEP
30.0000 mg | ORAL_CAPSULE | Freq: Every day | ORAL | Status: DC
  Administered 2019-02-03 – 2019-02-04 (×2): 30 mg via ORAL
  Filled 2019-02-03 (×2): qty 1

## 2019-02-03 MED ORDER — PROCHLORPERAZINE MALEATE 5 MG PO TABS: 10.0000 mg | ORAL_TABLET | Freq: Four times a day (QID) | ORAL | Status: DC | PRN

## 2019-02-03 MED ORDER — LANTHANUM CARBONATE 500 MG PO CHEW
2000.0000 mg | CHEWABLE_TABLET | Freq: Two times a day (BID) | ORAL | Status: DC
  Administered 2019-02-03 – 2019-02-04 (×2): 2000 mg via ORAL
  Filled 2019-02-03 (×4): qty 4

## 2019-02-03 MED ORDER — IOHEXOL 350 MG/ML SOLN
100.0000 mL | Freq: Once | INTRAVENOUS | Status: AC | PRN
  Administered 2019-02-03: 02:00:00 100 mL via INTRAVENOUS

## 2019-02-03 MED ORDER — PENTAFLUOROPROP-TETRAFLUOROETH EX AERO: 1.0000 "application " | INHALATION_SPRAY | CUTANEOUS | Status: DC | PRN

## 2019-02-03 MED ORDER — HEPARIN SODIUM (PORCINE) 1000 UNIT/ML DIALYSIS
40.0000 [IU]/kg | Freq: Once | INTRAMUSCULAR | Status: AC
  Administered 2019-02-04: 3000 [IU] via INTRAVENOUS_CENTRAL
  Filled 2019-02-03: qty 3

## 2019-02-03 MED ORDER — SODIUM CHLORIDE 0.9 % IV SOLN: 100.0000 mL | INTRAVENOUS | Status: DC | PRN

## 2019-02-03 MED ORDER — ONDANSETRON HCL 4 MG/2ML IJ SOLN
4.0000 mg | Freq: Four times a day (QID) | INTRAMUSCULAR | Status: DC | PRN
  Administered 2019-02-03: 4 mg via INTRAVENOUS
  Filled 2019-02-03: qty 2

## 2019-02-03 MED ORDER — HYDRALAZINE HCL 20 MG/ML IJ SOLN
10.0000 mg | Freq: Four times a day (QID) | INTRAMUSCULAR | Status: DC | PRN
  Administered 2019-02-03: 10 mg via INTRAVENOUS
  Filled 2019-02-03: qty 1

## 2019-02-03 MED ORDER — CINACALCET HCL 30 MG PO TABS
30.0000 mg | ORAL_TABLET | Freq: Every day | ORAL | Status: DC
  Filled 2019-02-03: qty 1

## 2019-02-03 MED ORDER — LANTHANUM CARBONATE 500 MG PO CHEW
1000.0000 mg | CHEWABLE_TABLET | ORAL | Status: DC
  Filled 2019-02-03 (×7): qty 2

## 2019-02-03 MED ORDER — SODIUM CHLORIDE 0.9% FLUSH
3.0000 mL | Freq: Two times a day (BID) | INTRAVENOUS | Status: DC
  Administered 2019-02-03 – 2019-02-04 (×2): 3 mL via INTRAVENOUS

## 2019-02-03 MED ORDER — HYDROCODONE-ACETAMINOPHEN 5-325 MG PO TABS: 1.0000 | ORAL_TABLET | ORAL | Status: DC | PRN

## 2019-02-03 MED ORDER — TIZANIDINE HCL 4 MG PO TABS
4.0000 mg | ORAL_TABLET | Freq: Three times a day (TID) | ORAL | Status: DC
  Administered 2019-02-03 – 2019-02-04 (×3): 4 mg via ORAL
  Filled 2019-02-03 (×4): qty 1

## 2019-02-03 MED ORDER — SODIUM CHLORIDE 0.9 % IV SOLN: 250.0000 mL | INTRAVENOUS | Status: DC | PRN

## 2019-02-03 MED ORDER — ACETAMINOPHEN 650 MG RE SUPP: 650.0000 mg | Freq: Four times a day (QID) | RECTAL | Status: DC | PRN

## 2019-02-03 MED ORDER — NEBIVOLOL HCL 10 MG PO TABS
10.0000 mg | ORAL_TABLET | ORAL | Status: DC
  Filled 2019-02-03: qty 1

## 2019-02-03 MED ORDER — LIDOCAINE HCL (PF) 1 % IJ SOLN: 5.0000 mL | INTRAMUSCULAR | Status: DC | PRN

## 2019-02-03 MED ORDER — CHLORHEXIDINE GLUCONATE CLOTH 2 % EX PADS
6.0000 | MEDICATED_PAD | Freq: Every day | CUTANEOUS | Status: DC
  Administered 2019-02-03 – 2019-02-04 (×2): 6 via TOPICAL

## 2019-02-03 MED ORDER — FUROSEMIDE 10 MG/ML IJ SOLN
60.0000 mg | Freq: Once | INTRAMUSCULAR | Status: AC
  Administered 2019-02-03: 60 mg via INTRAVENOUS
  Filled 2019-02-03: qty 6

## 2019-02-03 MED ORDER — ACETAMINOPHEN 325 MG PO TABS: 650.0000 mg | ORAL_TABLET | Freq: Four times a day (QID) | ORAL | Status: DC | PRN

## 2019-02-03 MED ORDER — ANASTROZOLE 1 MG PO TABS
1.0000 mg | ORAL_TABLET | Freq: Every day | ORAL | Status: DC
  Administered 2019-02-03 – 2019-02-04 (×2): 1 mg via ORAL
  Filled 2019-02-03 (×3): qty 1

## 2019-02-03 MED ORDER — HEPARIN SODIUM (PORCINE) 5000 UNIT/ML IJ SOLN
5000.0000 [IU] | Freq: Three times a day (TID) | INTRAMUSCULAR | Status: DC
  Administered 2019-02-03 – 2019-02-04 (×4): 5000 [IU] via SUBCUTANEOUS
  Filled 2019-02-03 (×4): qty 1

## 2019-02-03 MED ORDER — RENA-VITE PO TABS
1.0000 | ORAL_TABLET | Freq: Every day | ORAL | Status: DC
  Administered 2019-02-03: 23:00:00 1 via ORAL
  Filled 2019-02-03: qty 1

## 2019-02-03 MED ORDER — LIDOCAINE-PRILOCAINE 2.5-2.5 % EX CREA: 1.0000 "application " | TOPICAL_CREAM | CUTANEOUS | Status: DC | PRN

## 2019-02-03 MED ORDER — HEPARIN SODIUM (PORCINE) 1000 UNIT/ML DIALYSIS
100.0000 [IU]/kg | INTRAMUSCULAR | Status: DC | PRN
  Filled 2019-02-03 (×2): qty 8

## 2019-02-03 MED ORDER — HYDRALAZINE HCL 10 MG PO TABS
10.0000 mg | ORAL_TABLET | Freq: Three times a day (TID) | ORAL | Status: DC
  Filled 2019-02-03 (×8): qty 1

## 2019-02-03 MED ORDER — INSULIN ASPART 100 UNIT/ML ~~LOC~~ SOLN: 0.0000 [IU] | Freq: Every day | SUBCUTANEOUS | Status: DC

## 2019-02-03 MED ORDER — INSULIN ASPART 100 UNIT/ML ~~LOC~~ SOLN: 0.0000 [IU] | Freq: Three times a day (TID) | SUBCUTANEOUS | Status: DC

## 2019-02-03 MED ORDER — GABAPENTIN 300 MG PO CAPS
300.0000 mg | ORAL_CAPSULE | Freq: Every day | ORAL | Status: DC
  Administered 2019-02-03: 300 mg via ORAL
  Filled 2019-02-03: qty 1

## 2019-02-03 MED ORDER — DIALYVITE 800 0.8 MG PO TABS: 800.0000 mg | ORAL_TABLET | Freq: Every day | ORAL | Status: DC

## 2019-02-03 MED ORDER — BUTALBITAL-APAP-CAFFEINE 50-325-40 MG PO TABS: 1.0000 | ORAL_TABLET | Freq: Four times a day (QID) | ORAL | Status: DC | PRN

## 2019-02-03 MED ORDER — LOSARTAN POTASSIUM 50 MG PO TABS
50.0000 mg | ORAL_TABLET | Freq: Every day | ORAL | Status: DC
  Administered 2019-02-03: 50 mg via ORAL
  Filled 2019-02-03: qty 1

## 2019-02-03 NOTE — Care Management Obs Status (Signed)
[place] [facility] NOTIFICATION   Patient Details  Name: Megan [name] MRN: [id number] Date of Birth: [birth year]   Medicare Observation Status Notification Given:  Yes    Tommy Medal 02/03/2019, 4:12 PM

## 2019-02-03 NOTE — Progress Notes (Signed)
CRITICAL VALUE ALERT  Critical Value: Troponin 0.11  Date & Time Notied: 02/03/2019, 1233  Provider Notified: Dr. Manuella Ghazi.  Orders Received/Actions taken: Will continue to monitor patient

## 2019-02-03 NOTE — ED Notes (Signed)
CRITICAL VALUE ALERT  Critical Value: Troponin 0.11  Date & Time Notied: 02/03/2019 0533  Provider Notified: Dr. Dina Rich  Orders Received/Actions taken: n/a

## 2019-02-03 NOTE — Progress Notes (Signed)
Per HPI: Megan [name]  is a 58 y.o. female, w hypertension, hyperlipidemia, Dm2 with neuropathy, h/o CVA (lacunar on MRI 05/26/2018) , ESRD on HD (T, T, S), h/o temporal arteritis, osteopenia,  H/o R Breast Cancer (ER+PR-HER2-) s/p R mastectomy 09/18/2018 apparently c/o dyspnea x 1 week. Worse over the past few days.  + dyspnea with exertion. Slight dry cough, and slight chest discomfort only with cough.   Pt denies fever, chills, cp, palp, orthopnea, pnd, n/v, abd pain, diarrhea, brbpr.  Pt notes that she has been eating some salty food as well as her sbp 154 at home.  Pt did go for dialysis on Saturday and completed the whole session.  Pt states her weight has been stable and no significant lower ext edema. Pt was notified that her Hgb was 7.4 and told to go to ER if dyspnea worsened and therefore presented to ED  Review of records shows Nuclear stress test 06/11/2017 => EF 52%, low risk scan  Patient was admitted with acute hypoxemic respiratory failure that is questionably related to some volume overload.  She is also noted to have anemia and is undergoing transfusion of PRBC.  2D echocardiogram pending and nephrology consulted for dialysis which will be performed today.  She also appears to have some lung nodules to the right upper lobe and will follow up with her oncologist with repeat CT chest in 3 to 6 months.

## 2019-02-03 NOTE — Consult Note (Signed)
Reason for Consult:Fluid overload Referring Physician: [name] is an 58 y.o. female.  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  Long term HTN, ^ lipid, Ha, DM, and now being tx for Breast Ca.  SOB over weekend starting Sun. Came to ED with this and admitted. Unfortunately got CTA, and is getting blood which both aggravated.   No ankle edema, cough, CP, N, V. Cannot lie down to breathe.  No hx of this. Constitutional: as above Eyes: negative Ears, nose, mouth, throat, and face: negative Respiratory: SOB Cardiovascular: negative Gastrointestinal: negative Genitourinary:negative Integument/breast: negative Hematologic/lymphatic: Hb 7s Musculoskeletal:negative Neurological: HA daily Endocrine: DM Allergic/Immunologic: negative   Dialyzes at [place] on TTS since 2 yr. Primary Nephrologist Colodonato. . . Access LLA avf.  Past Medical History:  Diagnosis Date  . Anemia   . Blood transfusion without reported diagnosis   . Cataract   . CKD (chronic kidney disease) stage 3, GFR 30-59 ml/min (HCC)   . ESRD (end stage renal disease) on dialysis ([place])    on HD T, T, S  . Essential hypertension, benign   . GERD (gastroesophageal reflux disease)   . Headache   . Hemorrhoids   . Mixed hyperlipidemia   . PONV (postoperative nausea and vomiting)   . Renal insufficiency   . S/P colonoscopy Jan 2011   Dr. Benson Norway: sessile polyp (benign lymphoid), large hemorrhoids, repeat 5-10 years  . Temporal arteritis ([place])   . Type 2 diabetes mellitus ([place])   . Wears glasses     Past Surgical History:  Procedure Laterality Date  . ABDOMINAL HYSTERECTOMY    . APPENDECTOMY    . ARTERY BIOPSY N/A 05/09/2018   Procedure: RIGHT TEMPORAL ARTERY BIOPSY;  Surgeon: Judeth Horn, MD;  Location: [facility];  Service: General;  Laterality: N/A;  . CATARACT EXTRACTION W/PHACO Left 02/09/2017   Procedure: CATARACT EXTRACTION PHACO AND INTRAOCULAR LENS PLACEMENT  LEFT EYE;  Surgeon: Tonny Branch, MD;  Location: [facility];  Service: Ophthalmology;  Laterality: Left;  CDE: 4.89  . CATARACT EXTRACTION W/PHACO Right 06/04/2017   Procedure: CATARACT EXTRACTION PHACO AND INTRAOCULAR LENS PLACEMENT (IOC);  Surgeon: Tonny Branch, MD;  Location: [facility];  Service: Ophthalmology;  Laterality: Right;  CDE: 4.12  . CHOLECYSTECTOMY  09/29/2011   Procedure: LAPAROSCOPIC CHOLECYSTECTOMY;  Surgeon: Jamesetta So, MD;  Location: [facility];  Service: General;  Laterality: N/A;  . COLONOSCOPY  Jan 2011   Dr. Benson Norway: sessile polyp (benign lymphoid), large hemorrhoids, repeat 5-10 years  . COLONOSCOPY N/A 06/12/2016   Procedure: COLONOSCOPY;  Surgeon: Daneil Dolin, MD;  [facility];  Service: Endoscopy;  Laterality: N/A;  1230   . ESOPHAGOGASTRODUODENOSCOPY  09/05/2011   IRC:VELFY hiatal hernia; remainder of exam normal. No explanation for patient's abdominal pain with today's examination  . ESOPHAGOGASTRODUODENOSCOPY N/A 12/17/2013   Dr. [name]: gastric erythema, erosion, mild chronic inflammation on path   . LAPAROSCOPIC APPENDECTOMY  09/29/2011   Procedure: APPENDECTOMY LAPAROSCOPIC;  Surgeon: Jamesetta So, MD;  Location: [facility];  Service: General;;  incidental appendectomy  . PARTIAL MASTECTOMY WITH NEEDLE LOCALIZATION AND AXILLARY SENTINEL LYMPH NODE BX Right 09/18/2018   Procedure: RIGHT PARTIAL MASTECTOMY AFTER NEEDLE LOCALIZATION, SENTINEL LYMPH NODE BIOPSY RIGHT AXILLA;  Surgeon: Aviva Signs, MD;  Location: [facility];  Service: General;  Laterality: Right;    Family History  Problem Relation Age of Onset  . Hypertension Mother   .  Coronary artery disease Mother   . Diabetes Mother   . Hypertension Sister   . Coronary artery disease Sister   . Hypertension Brother   . Heart attack Father   . Hypertension Son   . Heart attack Maternal Aunt   . Hypertension Maternal Aunt   . Diabetes Maternal Aunt   . Heart attack Maternal Uncle   . Hypertension Maternal  Uncle   . Diabetes Maternal Uncle   . Heart attack Paternal Aunt   . Hypertension Paternal Aunt   . Diabetes Paternal Aunt   . Heart attack Paternal Uncle   . Hypertension Paternal Uncle   . Diabetes Paternal Uncle   . Heart attack Maternal Grandmother   . Heart attack Maternal Grandfather   . Heart attack Paternal Grandmother   . Heart attack Paternal Grandfather   . Colon cancer Neg Hx     Social History:  reports that she has never smoked. She has never used smokeless tobacco. She reports that she does not drink alcohol or use drugs.  Allergies: No Known Allergies  Medications: I have reviewed the patient's current medications.  Results for orders placed or performed during the hospital encounter of 02/02/19 (from the past 48 hour(s))  CBC with Differential     Status: Abnormal   Collection Time: 02/03/19 12:30 AM  Result Value Ref Range   WBC 10.3 4.0 - 10.5 K/uL   RBC 2.23 (L) 3.87 - 5.11 MIL/uL   Hemoglobin 7.0 (L) 12.0 - 15.0 g/dL   HCT 23.0 (L) 36.0 - 46.0 %   MCV 103.1 (H) 80.0 - 100.0 fL   MCH 31.4 26.0 - 34.0 pg   MCHC 30.4 30.0 - 36.0 g/dL   RDW 18.7 (H) 11.5 - 15.5 %   Platelets 333 150 - 400 K/uL   nRBC 0.0 0.0 - 0.2 %   Neutrophils Relative % 65 %   Neutro Abs 6.8 1.7 - 7.7 K/uL   Lymphocytes Relative 14 %   Lymphs Abs 1.4 0.7 - 4.0 K/uL   Monocytes Relative 16 %   Monocytes Absolute 1.6 (H) 0.1 - 1.0 K/uL   Eosinophils Relative 4 %   Eosinophils Absolute 0.4 0.0 - 0.5 K/uL   Basophils Relative 1 %   Basophils Absolute 0.1 0.0 - 0.1 K/uL   Immature Granulocytes 0 %   Abs Immature Granulocytes 0.04 0.00 - 0.07 K/uL    Comment: Performed at [facility], [address], [place], [place] [address]  Basic metabolic panel     Status: Abnormal   Collection Time: 02/03/19 12:30 AM  Result Value Ref Range   Sodium 138 135 - 145 mmol/L   Potassium 3.4 (L) 3.5 - 5.1 mmol/L   Chloride 101 98 - 111 mmol/L   CO2 28 22 - 32 mmol/L   Glucose, Bld 108 (H) 70 - 99  mg/dL   BUN 39 (H) 6 - 20 mg/dL   Creatinine, Ser 7.97 (H) 0.44 - 1.00 mg/dL   Calcium 9.0 8.9 - 10.3 mg/dL   GFR calc non Af Amer 5 (L) >60 mL/min   GFR calc Af Amer 6 (L) >60 mL/min   Anion gap 9 5 - 15    Comment: Performed at [facility], [address], [place], [place] [address]  Type and screen [facility]     Status: None (Preliminary result)   Collection Time: 02/03/19 12:30 AM  Result Value Ref Range   ABO/RH(D) B POS    Antibody Screen  NEG    Sample Expiration 02/06/2019,2359    Unit Number [id number]    Blood Component Type RED CELLS,LR    Unit division 00    Status of Unit ISSUED    Transfusion Status OK TO TRANSFUSE    Crossmatch Result      Compatible Performed at [facility], [address] [place], [place] [address]   Troponin I - ONCE - STAT     Status: Abnormal   Collection Time: 02/03/19 12:30 AM  Result Value Ref Range   Troponin I 0.09 (HH) <0.03 ng/mL    Comment: CRITICAL RESULT CALLED TO, READ BACK BY AND VERIFIED WITH: NICKOLS,K @ 7026 ON 02/03/19 BY JUW Performed at [facility], [address], [place], [place] [address]   Prepare RBC     Status: None   Collection Time: 02/03/19 12:30 AM  Result Value Ref Range   Order Confirmation      ORDER PROCESSED BY BLOOD BANK Performed at [facility], [address], [place], [place] [address]   Brain natriuretic peptide     Status: Abnormal   Collection Time: 02/03/19 12:30 AM  Result Value Ref Range   B Natriuretic Peptide 2,334.0 (H) 0.0 - 100.0 pg/mL    Comment: Performed at [facility], [address], [place], [place] [address]  SARS Coronavirus 2 (CEPHEID- Performed in [facility] hospital lab), Hosp Order     Status: None   Collection Time: 02/03/19  3:15 AM   Specimen: Nasopharyngeal Swab  Result Value Ref Range   SARS Coronavirus 2 NEGATIVE NEGATIVE    Comment: (NOTE) If result is NEGATIVE SARS-CoV-2 target nucleic acids are NOT DETECTED. The SARS-CoV-2 RNA is generally  detectable in upper and lower  respiratory specimens during the acute phase of infection. The lowest  concentration of SARS-CoV-2 viral copies this assay can detect is 250  copies / mL. A negative result does not preclude SARS-CoV-2 infection  and should not be used as the sole basis for treatment or other  patient management decisions.  A negative result may occur with  improper specimen collection / handling, submission of specimen other  than nasopharyngeal swab, presence of viral mutation(s) within the  areas targeted by this assay, and inadequate number of viral copies  (<250 copies / mL). A negative result must be combined with clinical  observations, patient history, and epidemiological information. If result is POSITIVE SARS-CoV-2 target nucleic acids are DETECTED. The SARS-CoV-2 RNA is generally detectable in upper and lower  respiratory specimens dur ing the acute phase of infection.  Positive  results are indicative of active infection with SARS-CoV-2.  Clinical  correlation with patient history and other diagnostic information is  necessary to determine patient infection status.  Positive results do  not rule out bacterial infection or co-infection with other viruses. If result is PRESUMPTIVE POSTIVE SARS-CoV-2 nucleic acids MAY BE PRESENT.   A presumptive positive result was obtained on the submitted specimen  and confirmed on repeat testing.  While 2019 novel coronavirus  (SARS-CoV-2) nucleic acids may be present in the submitted sample  additional confirmatory testing may be necessary for epidemiological  and / or clinical management purposes  to differentiate between  SARS-CoV-2 and other Sarbecovirus currently known to infect humans.  If clinically indicated additional testing with an alternate test  methodology [phone]) is advised. The SARS-CoV-2 RNA is generally  detectable in upper and lower respiratory sp ecimens during the acute  phase of infection. The  expected result is  Negative. Fact Sheet for Patients:  StrictlyIdeas.no Fact Sheet for Healthcare Providers: BankingDealers.co.za This test is not yet approved or cleared by the [place] FDA and has been authorized for detection and/or diagnosis of SARS-CoV-2 by FDA under an Emergency Use Authorization (EUA).  This EUA will remain in effect (meaning this test can be used) for the duration of the COVID-19 declaration under Section 564(b)(1) of the Act, 21 U.S.C. section 360bbb-3(b)(1), unless the authorization is terminated or revoked sooner. Performed at [facility], [address], [place], [place] [address]   Comprehensive metabolic panel     Status: Abnormal   Collection Time: 02/03/19  4:43 AM  Result Value Ref Range   Sodium 137 135 - 145 mmol/L   Potassium 3.8 3.5 - 5.1 mmol/L   Chloride 99 98 - 111 mmol/L   CO2 25 22 - 32 mmol/L   Glucose, Bld 117 (H) 70 - 99 mg/dL   BUN 41 (H) 6 - 20 mg/dL   Creatinine, Ser 8.16 (H) 0.44 - 1.00 mg/dL   Calcium 9.2 8.9 - 10.3 mg/dL   Total Protein 7.0 6.5 - 8.1 g/dL   Albumin 3.3 (L) 3.5 - 5.0 g/dL   AST 34 15 - 41 U/L   ALT 31 0 - 44 U/L   Alkaline Phosphatase 88 38 - 126 U/L   Total Bilirubin 0.5 0.3 - 1.2 mg/dL   GFR calc non Af Amer 5 (L) >60 mL/min   GFR calc Af Amer 6 (L) >60 mL/min   Anion gap 13 5 - 15    Comment: Performed at [facility], [address], [place], [place] [address]  CBC     Status: Abnormal   Collection Time: 02/03/19  4:43 AM  Result Value Ref Range   WBC 13.2 (H) 4.0 - 10.5 K/uL   RBC 2.37 (L) 3.87 - 5.11 MIL/uL   Hemoglobin 7.4 (L) 12.0 - 15.0 g/dL   HCT 24.9 (L) 36.0 - 46.0 %   MCV 105.1 (H) 80.0 - 100.0 fL   MCH 31.2 26.0 - 34.0 pg   MCHC 29.7 (L) 30.0 - 36.0 g/dL   RDW 18.6 (H) 11.5 - 15.5 %   Platelets 402 (H) 150 - 400 K/uL   nRBC 0.0 0.0 - 0.2 %    Comment: Performed at [facility], [address], [place], [place] [address]  Troponin I - Now  Then Q6H     Status: Abnormal   Collection Time: 02/03/19  4:43 AM  Result Value Ref Range   Troponin I 0.11 (HH) <0.03 ng/mL    Comment: CRITICAL RESULT CALLED TO, READ BACK BY AND VERIFIED WITH:  K. WATLINGTON@0532  02/03/2019 KAY Performed at [facility], [address], [place], [place] [address]   TSH     Status: None   Collection Time: 02/03/19  4:43 AM  Result Value Ref Range   TSH 1.837 0.350 - 4.500 uIU/mL    Comment: Performed by a 3rd Generation assay with a functional sensitivity of <=0.01 uIU/mL. Performed at [facility], [address] [place], [place] [address]   CBG monitoring, ED     Status: Abnormal   Collection Time: 02/03/19  7:41 AM  Result Value Ref Range   Glucose-Capillary 105 (H) 70 - 99 mg/dL    Ct Angio Chest Pe W And/or Wo Contrast  Result Date: 02/03/2019 CLINICAL DATA:  Shortness of breath for 2 weeks. PE suspected, high pretest prob. Breast cancer, finished chemotherapy 2 weeks ago. EXAM: CT  ANGIOGRAPHY CHEST WITH CONTRAST TECHNIQUE: Multidetector CT imaging of the chest was performed using the standard protocol during bolus administration of intravenous contrast. Multiplanar CT image reconstructions and MIPs were obtained to evaluate the vascular anatomy. CONTRAST:  166mL OMNIPAQUE IOHEXOL 350 MG/ML SOLN COMPARISON:  Radiograph earlier this day. FINDINGS: Cardiovascular: There are no filling defects within the pulmonary arteries to suggest pulmonary embolus. Aortic atherosclerosis without dissection. Cardiomegaly. No pericardial effusion. Mild contrast refluxing into the hepatic veins and IVC. Mediastinum/Nodes: Shotty mediastinal and hilar lymph nodes, all subcentimeter and likely reactive. Esophagus is decompressed. No visualized thyroid nodule. No axillary adenopathy. Surgical clips in the right axilla. Lungs/Pleura: Bilateral symmetric perihilar ground-glass opacities, smooth septal thickening, and bronchial thickening consistent with pulmonary edema. Small  bilateral pleural effusions with adjacent compressive atelectasis. Subpleural 5 mm nodule right upper lobe, image 68 series 6. 6 mm nodule anterior right upper lobe image 39. Upper Abdomen: Bilateral renal atrophy, patient with chronic renal disease on dialysis. High-density material in the colon of unknown etiology. Musculoskeletal: There are no acute or suspicious osseous abnormalities. Review of the MIP images confirms the above findings. IMPRESSION: 1. No pulmonary embolus. 2. Pulmonary edema and small bilateral pleural effusions. 3. Right upper lobe pulmonary nodules, largest measuring 6 mm. Non-contrast chest CT at 3-6 months is recommended. If the nodules are stable at time of repeat CT, then future CT at 18-24 months (from today's scan) is considered optional for low-risk patients, but is recommended for high-risk patients. This recommendation follows the consensus statement: Guidelines for Management of Incidental Pulmonary Nodules Detected on CT Images: From the Fleischner Society 2017; Radiology 2017; 284:228-243. Aortic Atherosclerosis (ICD10-I70.0). Electronically Signed   By: Keith Rake M.D.   On: 02/03/2019 02:48   Dg Chest Portable 1 View  Result Date: 02/03/2019 CLINICAL DATA:  Shortness of breath EXAM: PORTABLE CHEST 1 VIEW COMPARISON:  Chest radiograph 05/09/2018 FINDINGS: Mild cardiomegaly with mild interstitial pulmonary edema. No pleural effusion or pneumothorax. IMPRESSION: Mild cardiomegaly and interstitial pulmonary edema. Electronically Signed   By: Ulyses Jarred M.D.   On: 02/03/2019 01:09    ROS Blood pressure (!) 181/99, pulse 88, temperature 97.8 F (36.6 C), temperature source Axillary, resp. rate (!) 21, height 5\' 2"  (1.575 m), weight 74.8 kg, SpO2 97 %. Physical Exam Physical Examination: General appearance - alert, well appearing, and in no distress, oriented to person, place, and time and overweight Mental status - alert, oriented to person, place, and time,  normal mood, behavior, speech, dress, motor activity, and thought processes Eyes - pupils equal and reactive, extraocular eye movements intact, funduscopic exam normal, discs flat and sharp Mouth - mucous membranes moist, pharynx normal without lesions Neck - adenopathy noted PCL Lymphatics - posterior cervical nodes Chest - Rales L lung , very little on R Heart - S1 and S2 normal, systolic murmur Gr 2/6 at 2nd left intercostal space Abdomen - obese, pos bs, soft, Extremities - pedal edema Tr +, intact peripheral pulses, AVF LFA Skin - Dry  Assessment/Plan: 1 Fluid overload mild to mod,not sure is all vol.  Will do extra HD 2 ESRD: need to adjust dry., get on schedule 3 Hypertension: lower vol 4. Anemia of ESRD: needs esa, Fe eval 5. Metabolic Bone Disease: cont meds 6 DM 7  Breast Ca 8 Obesity P HD, esa, check Fe, PTH,    Megan [name] 02/03/2019, 8:53 AM

## 2019-02-03 NOTE — ED Provider Notes (Signed)
[facility] EMERGENCY DEPARTMENT Provider Note   CSN: [id number] Arrival date & time: 02/02/19  2337    History   Chief Complaint Chief Complaint  Patient presents with   Shortness of Breath    HPI Megan [name] is a 58 y.o. female.     HPI  This is a 58 year old female with a history of breast cancer, end-stage renal disease on dialysis Tuesday, Thursday, and Saturday, anemia who presents with shortness of breath.  Patient reports for the last 2 weeks she has had progressive shortness of breath.  Reports dyspnea on exertion.  She denies orthopnea or leg swelling.  She occasionally has some chest pain associated with deep breathing.  She denies any recent fevers or sick contacts.  She does report a dry cough.  Patient reports that she had a full dialysis session on Saturday.  At that time she was told her hemoglobin was 7.3.  She was to follow-up with her oncologist earlier this week.  However, tonight she had increasingly worsening shortness of breath.  Currently she is not having any chest pain.  Past Medical History:  Diagnosis Date   Anemia    Blood transfusion without reported diagnosis    Cataract    CKD (chronic kidney disease) stage 3, GFR 30-59 ml/min (HCC)    ESRD (end stage renal disease) on dialysis ([place])    on HD T, T, S   Essential hypertension, benign    GERD (gastroesophageal reflux disease)    Headache    Hemorrhoids    Mixed hyperlipidemia    PONV (postoperative nausea and vomiting)    Renal insufficiency    S/P colonoscopy Jan 2011   Dr. 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[place])    Rectal bleeding 06/08/2016   Vaginal discharge 12/30/2015   Abdominal pain, epigastric 11/26/2013   GERD (gastroesophageal reflux disease)    Type 2 diabetes mellitus (HCC)    CKD (chronic kidney disease) stage 3, GFR 30-59 ml/min (HCC)    External hemorrhoid 08/01/2011   Hematochezia 07/13/2011   Dyspnea 04/21/2010   Atypical chest pain 04/21/2010   Mixed hyperlipidemia 03/11/2008   OBESITY 03/11/2008   Essential hypertension, benign 03/11/2008    Past Surgical History:  Procedure Laterality Date   ABDOMINAL HYSTERECTOMY     APPENDECTOMY     ARTERY BIOPSY N/A 05/09/2018   Procedure: RIGHT TEMPORAL ARTERY BIOPSY;  Surgeon: Judeth Horn, MD;  Location: [place];  Service: General;  Laterality: N/A;   CATARACT EXTRACTION W/PHACO Left 02/09/2017   Procedure: CATARACT EXTRACTION PHACO AND INTRAOCULAR LENS PLACEMENT LEFT EYE;  Surgeon: Tonny Branch, MD;  Location: [facility];  Service: Ophthalmology;  Laterality: Left;  CDE: 4.89   CATARACT EXTRACTION W/PHACO Right 06/04/2017   Procedure: CATARACT EXTRACTION PHACO AND INTRAOCULAR LENS PLACEMENT (IOC);  Surgeon: Tonny Branch, MD;  Location: [facility];  Service: Ophthalmology;  Laterality: Right;  CDE: 4.12   CHOLECYSTECTOMY  09/29/2011   Procedure: LAPAROSCOPIC CHOLECYSTECTOMY;  Surgeon: Jamesetta So, MD;  Location: [facility];  Service: General;  Laterality: N/A;  COLONOSCOPY  Jan 2011   Dr. Benson Norway: sessile polyp (benign lymphoid), large hemorrhoids, repeat 5-10 years   COLONOSCOPY N/A 06/12/2016   Procedure: COLONOSCOPY;  Surgeon: Daneil Dolin, MD;  [facility];  Service: Endoscopy;  Laterality: N/A;  1230    ESOPHAGOGASTRODUODENOSCOPY  09/05/2011   JOA:CZYSA hiatal hernia; remainder of exam normal. No explanation for patient's abdominal pain with today's examination   ESOPHAGOGASTRODUODENOSCOPY N/A 12/17/2013   Dr. [name]: gastric erythema,  erosion, mild chronic inflammation on path    LAPAROSCOPIC APPENDECTOMY  09/29/2011   Procedure: APPENDECTOMY LAPAROSCOPIC;  Surgeon: Jamesetta So, MD;  Location: [facility];  Service: General;;  incidental appendectomy   PARTIAL MASTECTOMY WITH NEEDLE LOCALIZATION AND AXILLARY SENTINEL LYMPH NODE BX Right 09/18/2018   Procedure: RIGHT PARTIAL MASTECTOMY AFTER NEEDLE LOCALIZATION, SENTINEL LYMPH NODE BIOPSY RIGHT AXILLA;  Surgeon: Aviva Signs, MD;  Location: [facility];  Service: General;  Laterality: Right;     OB History   No obstetric history on file.      Home Medications    Prior to Admission medications   Medication Sig Start Date End Date Taking? Authorizing Provider  amLODipine (NORVASC) 5 MG tablet Take 1 tablet (5 mg total) by mouth daily. Patient taking differently: Take 5 mg by mouth daily as needed (blood pressure 159 or above).  08/19/18   [name], MD  anastrozole (ARIMIDEX) 1 MG tablet Take 1 tablet (1 mg total) by mouth daily. 01/29/19   Derek Jack, MD  aspirin EC 81 MG tablet Take 81 mg by mouth daily.    [provider]  atorvastatin (LIPITOR) 20 MG tablet Take 1 tablet (20 mg total) by mouth daily. Patient taking differently: Take 20 mg by mouth at bedtime.  08/16/18   [name], MD  B Complex-C-Folic Acid (DIALYVITE 630) 0.8 MG TABS Take 800 mg by mouth daily.  01/22/18   [provider]  butalbital-acetaminophen-caffeine (FIORICET, ESGIC) 50-325-40 MG tablet Take 1 tablet by mouth every 6 (six) hours as needed for headache. 06/04/18 06/04/19  [name], MD  cinacalcet (SENSIPAR) 30 MG tablet Take 2 tablets (60 mg total) by mouth daily. Patient taking differently: Take 30 mg by mouth daily.  06/13/18   [name], MD  DULoxetine (CYMBALTA) 30 MG capsule Take 1 capsule (30 mg total) by mouth daily. 10/03/18   [name], MD  gabapentin (NEURONTIN) 300 MG capsule TAKE 1 CAPSULE(300 MG) BY MOUTH AT BEDTIME 09/16/18    [name], MD  HYDROcodone-acetaminophen (NORCO) 5-325 MG tablet Take 1 tablet by mouth every 4 (four) hours as needed for moderate pain. 09/18/18   Aviva Signs, MD  lanthanum (FOSRENOL) 1000 MG chewable tablet Chew 1,000-2,000 mg by mouth See admin instructions. Take 2000 mg by mouth twice daily with meals and 1000 with a snack 07/01/18   [provider]  lidocaine-prilocaine (EMLA) cream APPLY SMALL AMOUNT TO ACCESS SITE (AVF) 1 TO 2 HOURS BEFORE DIALYSIS. COVER WITH OCCLUSIVE DRESSING (SARAN WRAP) 09/24/18   [provider]  losartan (COZAAR) 50 MG tablet Take 1 tablet (50 mg total) by mouth daily. Patient taking differently: Take 50 mg by mouth at bedtime.  03/28/18   [name], MD  nebivolol (BYSTOLIC) 10 MG tablet Take 1 tablet (10 mg total) by mouth 2 (two) times daily. Patient taking differently: Take 10 mg by mouth 3 (three) times a week. Take on the days of dialysis Tuesdays, Thursdays, and Saturdays 06/13/18  [name], MD  ondansetron (ZOFRAN) 4 MG tablet Take 1 tablet (4 mg total) by mouth every 8 (eight) hours as needed for nausea or vomiting. 09/18/18   Aviva Signs, MD  pantoprazole (PROTONIX) 40 MG tablet TAKE 1 TABLET(40 MG) BY MOUTH DAILY Patient taking differently: Take 40 mg by mouth daily before breakfast.  08/19/18   [name], MD  prochlorperazine (COMPAZINE) 10 MG tablet Take 1 tablet (10 mg total) by mouth every 6 (six) hours as needed (Nausea or vomiting). 10/29/18   Derek Jack, MD  tiZANidine (ZANAFLEX) 4 MG capsule TAKE 1 CAPSULE(4 MG) BY MOUTH TWICE DAILY WITH A MEAL. START 1 CAPSULE AT NIGHT FOR 2 WEEKS AND THEN TWICE DAILY 01/14/19   Garvin Fila, MD    Family History Family History  Problem Relation Age of Onset   Hypertension Mother    Coronary artery disease Mother    Diabetes Mother    Hypertension Sister    Coronary artery disease Sister    Hypertension Brother    Heart attack Father     Hypertension Son    Heart attack Maternal Aunt    Hypertension Maternal Aunt    Diabetes Maternal Aunt    Heart attack Maternal Uncle    Hypertension Maternal Uncle    Diabetes Maternal Uncle    Heart attack Paternal Aunt    Hypertension Paternal Aunt    Diabetes Paternal Aunt    Heart attack Paternal Uncle    Hypertension Paternal Uncle    Diabetes Paternal Uncle    Heart attack Maternal Grandmother    Heart attack Maternal Grandfather    Heart attack Paternal Grandmother    Heart attack Paternal Grandfather    Colon cancer Neg Hx     Social History Social History   Tobacco Use   Smoking status: Never Smoker   Smokeless tobacco: Never Used  Substance Use Topics   Alcohol use: No   Drug use: No     Allergies   Patient has no known allergies.   Review of Systems Review of Systems  Constitutional: Positive for fever.  Respiratory: Positive for cough and shortness of breath. Negative for wheezing.   Cardiovascular: Positive for chest pain. Negative for leg swelling.  Gastrointestinal: Negative for abdominal pain, nausea and vomiting.  Musculoskeletal: Negative for back pain.  Neurological: Negative for dizziness.  All other systems reviewed and are negative.    Physical Exam Updated Vital Signs BP (!) 180/96    Pulse 96    Temp 98.7 F (37.1 C) (Oral)    Resp (!) 25    Ht 1.575 m (5\' 2" )    Wt 74.8 kg    SpO2 97%    BMI 30.18 kg/m   Physical Exam Vitals signs and nursing note reviewed.  Constitutional:      Appearance: She is well-developed.     Comments: Tonically ill-appearing, nontoxic  HENT:     Head: Normocephalic and atraumatic.     Comments: Alopecia Eyes:     Pupils: Pupils are equal, round, and reactive to light.  Neck:     Musculoskeletal: Neck supple.  Cardiovascular:     Rate and Rhythm: Normal rate and regular rhythm.     Heart sounds: Normal heart sounds.  Pulmonary:     Effort: Pulmonary effort is normal. No  respiratory distress.     Breath sounds: No wheezing.  Chest:     Comments: Right lumpectomy scar Abdominal:     General:  Bowel sounds are normal.     Palpations: Abdomen is soft.  Musculoskeletal:     Right lower leg: She exhibits no tenderness. No edema.     Left lower leg: She exhibits no tenderness. No edema.  Skin:    General: Skin is warm and dry.  Neurological:     Mental Status: She is alert and oriented to person, place, and time.  Psychiatric:        Mood and Affect: Mood normal.      ED Treatments / Results  Labs (all labs ordered are listed, but only abnormal results are displayed) Labs Reviewed  CBC WITH DIFFERENTIAL/PLATELET - Abnormal; Notable for the following components:      Result Value   RBC 2.23 (*)    Hemoglobin 7.0 (*)    HCT 23.0 (*)    MCV 103.1 (*)    RDW 18.7 (*)    Monocytes Absolute 1.6 (*)    All other components within normal limits  BASIC METABOLIC PANEL - Abnormal; Notable for the following components:   Potassium 3.4 (*)    Glucose, Bld 108 (*)    BUN 39 (*)    Creatinine, Ser 7.97 (*)    GFR calc non Af Amer 5 (*)    GFR calc Af Amer 6 (*)    All other components within normal limits  TROPONIN I - Abnormal; Notable for the following components:   Troponin I 0.09 (*)    All other components within normal limits  SARS CORONAVIRUS 2 (HOSPITAL ORDER, [facility])  BRAIN NATRIURETIC PEPTIDE  TYPE AND SCREEN  PREPARE RBC (CROSSMATCH)    EKG EKG Interpretation  Date/Time:  Sunday February 02 2019 23:56:49 EDT Ventricular Rate:  94 PR Interval:    QRS Duration: 93 QT Interval:  391 QTC Calculation: 489 R Axis:   18 Text Interpretation:  Sinus rhythm Probable left atrial enlargement Borderline prolonged QT interval Confirmed by Thayer Jew [phone]) on 02/03/2019 12:12:20 AM   Radiology Ct Angio Chest Pe W And/or Wo Contrast  Result Date: 02/03/2019 CLINICAL DATA:  Shortness of breath for 2 weeks. PE  suspected, high pretest prob. Breast cancer, finished chemotherapy 2 weeks ago. EXAM: CT ANGIOGRAPHY CHEST WITH CONTRAST TECHNIQUE: Multidetector CT imaging of the chest was performed using the standard protocol during bolus administration of intravenous contrast. Multiplanar CT image reconstructions and MIPs were obtained to evaluate the vascular anatomy. CONTRAST:  171mL OMNIPAQUE IOHEXOL 350 MG/ML SOLN COMPARISON:  Radiograph earlier this day. FINDINGS: Cardiovascular: There are no filling defects within the pulmonary arteries to suggest pulmonary embolus. Aortic atherosclerosis without dissection. Cardiomegaly. No pericardial effusion. Mild contrast refluxing into the hepatic veins and IVC. Mediastinum/Nodes: Shotty mediastinal and hilar lymph nodes, all subcentimeter and likely reactive. Esophagus is decompressed. No visualized thyroid nodule. No axillary adenopathy. Surgical clips in the right axilla. Lungs/Pleura: Bilateral symmetric perihilar ground-glass opacities, smooth septal thickening, and bronchial thickening consistent with pulmonary edema. Small bilateral pleural effusions with adjacent compressive atelectasis. Subpleural 5 mm nodule right upper lobe, image 68 series 6. 6 mm nodule anterior right upper lobe image 39. Upper Abdomen: Bilateral renal atrophy, patient with chronic renal disease on dialysis. High-density material in the colon of unknown etiology. Musculoskeletal: There are no acute or suspicious osseous abnormalities. Review of the MIP images confirms the above findings. IMPRESSION: 1. No pulmonary embolus. 2. Pulmonary edema and small bilateral pleural effusions. 3. Right upper lobe pulmonary nodules, largest measuring 6 mm. Non-contrast chest  CT at 3-6 months is recommended. If the nodules are stable at time of repeat CT, then future CT at 18-24 months (from today's scan) is considered optional for low-risk patients, but is recommended for high-risk patients. This recommendation  follows the consensus statement: Guidelines for Management of Incidental Pulmonary Nodules Detected on CT Images: From the Fleischner Society 2017; Radiology 2017; 284:228-243. Aortic Atherosclerosis (ICD10-I70.0). Electronically Signed   By: Keith Rake M.D.   On: 02/03/2019 02:48   Dg Chest Portable 1 View  Result Date: 02/03/2019 CLINICAL DATA:  Shortness of breath EXAM: PORTABLE CHEST 1 VIEW COMPARISON:  Chest radiograph 05/09/2018 FINDINGS: Mild cardiomegaly with mild interstitial pulmonary edema. No pleural effusion or pneumothorax. IMPRESSION: Mild cardiomegaly and interstitial pulmonary edema. Electronically Signed   By: Ulyses Jarred M.D.   On: 02/03/2019 01:09    Procedures Procedures (including critical care time)  CRITICAL CARE Performed by: Merryl Hacker   Total critical care time: 35 minutes  Critical care time was exclusive of separately billable procedures and treating other patients.  Critical care was necessary to treat or prevent imminent or life-threatening deterioration.  Critical care was time spent personally by me on the following activities: development of treatment plan with patient and/or surrogate as well as nursing, discussions with consultants, evaluation of patient's response to treatment, examination of patient, obtaining history from patient or surrogate, ordering and performing treatments and interventions, ordering and review of laboratory studies, ordering and review of radiographic studies, pulse oximetry and re-evaluation of patient's condition.   Medications Ordered in ED Medications  0.9 %  sodium chloride infusion (Manually program via Guardrails IV Fluids) (has no administration in time range)  iohexol (OMNIPAQUE) 350 MG/ML injection 100 mL (100 mLs Intravenous Contrast Given 02/03/19 0221)     Initial Impression / Assessment and Plan / ED Course  I have reviewed the triage vital signs and the nursing notes.  Pertinent labs & imaging  results that were available during my care of the patient were reviewed by me and considered in my medical decision making (see chart for details).        Patient presents with shortness of breath.  Ongoing for the last several days.  She is overall nontoxic-appearing and vital signs are notable for blood pressure 180/96.  She is on dialysis and had a full dialysis session on Saturday.  EKG shows no evidence of ischemia or arrhythmia.  Chest x-ray shows mild cardiomegaly with some pulmonary edema.  No pneumonia or pneumothorax.  Troponin is mildly elevated at 0.09.  She has previously had normal troponins.  I have reviewed her chart.  She had a cardiogram with a normal EF and a nuclear stress test that was low risk within the last year.  Question whether this may be demand related.  Hemoglobin is 7.0.  Patient was typed and screened for transfusion.  CT scan PE study was obtained given her history of cancer.  There is no PE on exam.  Given her symptomatic anemia and elevated troponin, will admit for serial troponins and possible cardiology evaluation as well as transfusion.  This was discussed with Dr. Maudie Mercury who agrees with the plan.    Final Clinical Impressions(s) / ED Diagnoses   Final diagnoses:  Symptomatic anemia  Elevated troponin    ED Discharge Orders    None       Kyrstin Campillo, Barbette Hair, MD 02/03/19 [phone]

## 2019-02-03 NOTE — ED Notes (Signed)
Date and time results received: 02/03/19 1:13 AM  (use smartphrase ".now" to insert current time)  Test: trop  Critical Value: 0.09 Name of Provider Notified: horton   Orders Received? Or Actions Taken?  Marland Kitchen

## 2019-02-03 NOTE — H&P (Signed)
TRH H&P    Patient Demographics:    Megan [name], is a 58 y.o. female  MRN: [id number]  DOB - [birth year]  Admit Date - 02/02/2019  Referring MD/NP/PA: Thayer Jew  Outpatient Primary MD for the patient is Pickard, Cammie Mcgee, MD [facility]  - ? [facility]   Patient coming from: home  Chief complaint-  dyspnea   HPI:    Megan [name]  is a 58 y.o. female, w hypertension, hyperlipidemia, Dm2 with neuropathy, h/o CVA (lacunar on MRI 05/26/2018) , ESRD on HD (T, T, S), h/o temporal arteritis, osteopenia,  H/o R Breast Cancer (ER+PR-HER2-) s/p R mastectomy 09/18/2018 apparently c/o dyspnea x 1 week. Worse over the past few days.  + dyspnea with exertion. Slight dry cough, and slight chest discomfort only with cough.   Pt denies fever, chills, cp, palp, orthopnea, pnd, n/v, abd pain, diarrhea, brbpr.  Pt notes that she has been eating some salty food as well as her sbp 154 at home.  Pt did go for dialysis on Saturday and completed the whole session.  Pt states her weight has been stable and no significant lower ext edema. Pt was notified that her Hgb was 7.4 and told to go to ER if dyspnea worsened and therefore presented to ED  Review of records shows Nuclear stress test 06/11/2017 => EF 52%, low risk scan  In ED T 98.7 P 96  R 25, Bp 180/96  Pox 88% Wt 74.8 kg  Wbc 10.3, Hgb 7.0 Plt 333 Na 138, K 3.4  Bun 39, Creatinine 7.97  Glucose 108  CTA chest IMPRESSION: 1. No pulmonary embolus. 2. Pulmonary edema and small bilateral pleural effusions. 3. Right upper lobe pulmonary nodules, largest measuring 6 mm. Non-contrast chest CT at 3-6 months is recommended. If the nodules are stable at time of repeat CT, then future CT at 18-24 months (from today's scan) is considered optional for low-risk  patients, but is recommended for high-risk patients.  Trop 0.09  EKG nsr at 95, nl axis, nl int, no st-t changes c/w ischemia  Pt given 1 unit prbc in ED,   Pt will be admitted for dyspnea, secondary to fluid overload , acute diastolic CHF, and symptomatic anemia.      Review of systems:    In addition to the HPI above,  No Fever-chills, No Headache, No changes with Vision or hearing, No problems swallowing food or Liquids, No Chest pain, No Cough  No Abdominal pain, No Nausea or Vomiting, bowel movements are regular, No Blood in stool or Urine, No dysuria, No new skin rashes or bruises, No new joints pains-aches,  No new weakness, tingling, numbness in any extremity, No recent weight gain or loss, No polyuria, polydypsia or polyphagia, No significant Mental Stressors.  All other systems reviewed and are negative.    Past History of the following :    Past Medical History:  Diagnosis Date   Anemia    Blood transfusion without reported  diagnosis    Cataract    CKD (chronic kidney disease) stage 3, GFR 30-59 ml/min (HCC)    ESRD (end stage renal disease) on dialysis ([place])    on HD T, T, S   Essential hypertension, benign    GERD (gastroesophageal reflux disease)    Headache    Hemorrhoids    Mixed hyperlipidemia    PONV (postoperative nausea and vomiting)    Renal insufficiency    S/P colonoscopy Jan 2011   Dr. Benson Norway: sessile polyp (benign lymphoid), large hemorrhoids, repeat 5-10 years   Temporal arteritis ([place])    Type 2 diabetes mellitus ([place])    Wears glasses       Past Surgical History:  Procedure Laterality Date   ABDOMINAL HYSTERECTOMY     APPENDECTOMY     ARTERY BIOPSY N/A 05/09/2018   Procedure: RIGHT TEMPORAL ARTERY BIOPSY;  Surgeon: Judeth Horn, MD;  Location: [place];  Service: General;  Laterality: N/A;   CATARACT EXTRACTION W/PHACO Left 02/09/2017   Procedure: CATARACT EXTRACTION PHACO AND INTRAOCULAR LENS PLACEMENT LEFT EYE;   Surgeon: Tonny Branch, MD;  Location: [facility];  Service: Ophthalmology;  Laterality: Left;  CDE: 4.89   CATARACT EXTRACTION W/PHACO Right 06/04/2017   Procedure: CATARACT EXTRACTION PHACO AND INTRAOCULAR LENS PLACEMENT (IOC);  Surgeon: Tonny Branch, MD;  Location: [facility];  Service: Ophthalmology;  Laterality: Right;  CDE: 4.12   CHOLECYSTECTOMY  09/29/2011   Procedure: LAPAROSCOPIC CHOLECYSTECTOMY;  Surgeon: Jamesetta So, MD;  Location: [facility];  Service: General;  Laterality: N/A;   COLONOSCOPY  Jan 2011   Dr. Benson Norway: sessile polyp (benign lymphoid), large hemorrhoids, repeat 5-10 years   COLONOSCOPY N/A 06/12/2016   Procedure: COLONOSCOPY;  Surgeon: Daneil Dolin, MD;  [facility];  Service: Endoscopy;  Laterality: N/A;  1230    ESOPHAGOGASTRODUODENOSCOPY  09/05/2011   QBV:[id number] hiatal hernia; remainder of exam normal. No explanation for patient's abdominal pain with today's examination   ESOPHAGOGASTRODUODENOSCOPY N/A 12/17/2013   Dr. [name]: gastric erythema, erosion, mild chronic inflammation on path    LAPAROSCOPIC APPENDECTOMY  09/29/2011   Procedure: APPENDECTOMY LAPAROSCOPIC;  Surgeon: Jamesetta So, MD;  Location: [facility];  Service: General;;  incidental appendectomy   PARTIAL MASTECTOMY WITH NEEDLE LOCALIZATION AND AXILLARY SENTINEL LYMPH NODE BX Right 09/18/2018   Procedure: RIGHT PARTIAL MASTECTOMY AFTER NEEDLE LOCALIZATION, SENTINEL LYMPH NODE BIOPSY RIGHT AXILLA;  Surgeon: Aviva Signs, MD;  Location: [facility];  Service: General;  Laterality: Right;      Social History:      Social History   Tobacco Use   Smoking status: Never Smoker   Smokeless tobacco: Never Used  Substance Use Topics   Alcohol use: No       Family History :     Family History  Problem Relation Age of Onset   Hypertension Mother    Coronary artery disease Mother    Diabetes Mother    Hypertension Sister    Coronary artery disease Sister    Hypertension Brother    Heart  attack Father    Hypertension Son    Heart attack Maternal Aunt    Hypertension Maternal Aunt    Diabetes Maternal Aunt    Heart attack Maternal Uncle    Hypertension Maternal Uncle    Diabetes Maternal Uncle    Heart attack Paternal Aunt    Hypertension Paternal Aunt    Diabetes Paternal Aunt    Heart attack Paternal Uncle    Hypertension Paternal  Uncle    Diabetes Paternal Uncle    Heart attack Maternal Grandmother    Heart attack Maternal Grandfather    Heart attack Paternal Grandmother    Heart attack Paternal Grandfather    Colon cancer Neg Hx        Home Medications:   Prior to Admission medications   Medication Sig Start Date End Date Taking? Authorizing Provider  amLODipine (NORVASC) 5 MG tablet Take 1 tablet (5 mg total) by mouth daily. Patient taking differently: Take 5 mg by mouth daily as needed (blood pressure 159 or above).  08/19/18   [name], MD  anastrozole (ARIMIDEX) 1 MG tablet Take 1 tablet (1 mg total) by mouth daily. 01/29/19   Derek Jack, MD  aspirin EC 81 MG tablet Take 81 mg by mouth daily.    [provider]  atorvastatin (LIPITOR) 20 MG tablet Take 1 tablet (20 mg total) by mouth daily. Patient taking differently: Take 20 mg by mouth at bedtime.  08/16/18   [name], MD  B Complex-C-Folic Acid (DIALYVITE 373) 0.8 MG TABS Take 800 mg by mouth daily.  01/22/18   [provider]  butalbital-acetaminophen-caffeine (FIORICET, ESGIC) 50-325-40 MG tablet Take 1 tablet by mouth every 6 (six) hours as needed for headache. 06/04/18 06/04/19  [name], MD  cinacalcet (SENSIPAR) 30 MG tablet Take 2 tablets (60 mg total) by mouth daily. Patient taking differently: Take 30 mg by mouth daily.  06/13/18   [name], MD  DULoxetine (CYMBALTA) 30 MG capsule Take 1 capsule (30 mg total) by mouth daily. 10/03/18   [name], MD  gabapentin (NEURONTIN) 300 MG capsule TAKE 1 CAPSULE(300 MG) BY  MOUTH AT BEDTIME 09/16/18   [name], MD  HYDROcodone-acetaminophen (NORCO) 5-325 MG tablet Take 1 tablet by mouth every 4 (four) hours as needed for moderate pain. 09/18/18   Aviva Signs, MD  lanthanum (FOSRENOL) 1000 MG chewable tablet Chew 1,000-2,000 mg by mouth See admin instructions. Take 2000 mg by mouth twice daily with meals and 1000 with a snack 07/01/18   [provider]  lidocaine-prilocaine (EMLA) cream APPLY SMALL AMOUNT TO ACCESS SITE (AVF) 1 TO 2 HOURS BEFORE DIALYSIS. COVER WITH OCCLUSIVE DRESSING (SARAN WRAP) 09/24/18   [provider]  losartan (COZAAR) 50 MG tablet Take 1 tablet (50 mg total) by mouth daily. Patient taking differently: Take 50 mg by mouth at bedtime.  03/28/18   [name], MD  nebivolol (BYSTOLIC) 10 MG tablet Take 1 tablet (10 mg total) by mouth 2 (two) times daily. Patient taking differently: Take 10 mg by mouth 3 (three) times a week. Take on the days of dialysis Tuesdays, Thursdays, and Saturdays 06/13/18   [name], MD  ondansetron (ZOFRAN) 4 MG tablet Take 1 tablet (4 mg total) by mouth every 8 (eight) hours as needed for nausea or vomiting. 09/18/18   Aviva Signs, MD  pantoprazole (PROTONIX) 40 MG tablet TAKE 1 TABLET(40 MG) BY MOUTH DAILY Patient taking differently: Take 40 mg by mouth daily before breakfast.  08/19/18   [name], MD  prochlorperazine (COMPAZINE) 10 MG tablet Take 1 tablet (10 mg total) by mouth every 6 (six) hours as needed (Nausea or vomiting). 10/29/18   Derek Jack, MD  tiZANidine (ZANAFLEX) 4 MG capsule TAKE 1 CAPSULE(4 MG) BY MOUTH TWICE DAILY WITH A MEAL. START 1 CAPSULE AT NIGHT FOR 2 WEEKS AND THEN TWICE DAILY 01/14/19   Antony Contras  S, MD     Allergies:    No Known Allergies   Physical Exam:   Vitals  Blood pressure (!) 180/96, pulse 96, temperature 98.7 F (37.1 C), temperature source Oral, resp. rate (!) 25, height 5' 2"  (1.575 m), weight 74.8 kg, SpO2 97 %.  1.   General: axox3  2. Psychiatric: euthymic  3. Neurologic: cn2-12 intact, reflexes 2+ symmetric, diffuse with no clonus, motor 5/5 in all 4ext  4. HEENMT:  Anicteric, pupils 1.72m symmetric, direct, consensual intact Neck: slight  jvd  5. Respiratory : Slight decrease in bs and  crackles bilateral base, no wheezing  6. Cardiovascular : rrr s1, s2, no m/g/r    7. Gastrointestinal:  Abd: soft, nt, nd, +bs  8. Skin:  Ext: no c/c/e, no rash + AVF L arm, + thrill  9.Musculoskeletal:  Good ROM  No adenopathy       Data Review:    CBC Recent Labs  Lab 01/29/19 1013 02/03/19 0030  WBC 8.9 10.3  HGB 7.6* 7.0*  HCT 24.5* 23.0*  PLT 386 333  MCV 105.2* 103.1*  MCH 32.6 31.4  MCHC 31.0 30.4  RDW 19.5* 18.7*  LYMPHSABS 1.2 1.4  MONOABS 1.5* 1.6*  EOSABS 0.3 0.4  BASOSABS 0.1 0.1   ------------------------------------------------------------------------------------------------------------------  Results for orders placed or performed during the hospital encounter of 02/02/19 (from the past 48 hour(s))  CBC with Differential     Status: Abnormal   Collection Time: 02/03/19 12:30 AM  Result Value Ref Range   WBC 10.3 4.0 - 10.5 K/uL   RBC 2.23 (L) 3.87 - 5.11 MIL/uL   Hemoglobin 7.0 (L) 12.0 - 15.0 g/dL   HCT 23.0 (L) 36.0 - 46.0 %   MCV 103.1 (H) 80.0 - 100.0 fL   MCH 31.4 26.0 - 34.0 pg   MCHC 30.4 30.0 - 36.0 g/dL   RDW 18.7 (H) 11.5 - 15.5 %   Platelets 333 150 - 400 K/uL   nRBC 0.0 0.0 - 0.2 %   Neutrophils Relative % 65 %   Neutro Abs 6.8 1.7 - 7.7 K/uL   Lymphocytes Relative 14 %   Lymphs Abs 1.4 0.7 - 4.0 K/uL   Monocytes Relative 16 %   Monocytes Absolute 1.6 (H) 0.1 - 1.0 K/uL   Eosinophils Relative 4 %   Eosinophils Absolute 0.4 0.0 - 0.5 K/uL   Basophils Relative 1 %   Basophils Absolute 0.1 0.0 - 0.1 K/uL   Immature Granulocytes 0 %   Abs Immature Granulocytes 0.04 0.00 - 0.07 K/uL    Comment: [facility]formed at [facility] [address],  [place] [id number] Basic metabolic panel     Status: Abnormal   Collection Time: 02/03/19 12:30 AM  Result Value Ref Range   Sodium 138 135 - 145 mmol/L   Potassium 3.4 (L) 3.5 - 5.1 mmol/L   Chloride 101 98 - 111 mmol/L   CO2 28 22 - 32 mmol/L   Glucose, Bld 108 (H) 70 - 99 mg/dL   BUN 39 (H) 6 - 20 mg/dL   Creatinine, Ser 7.97 (H) 0.44 - 1.00 mg/dL   Calcium 9.0 8.9 - 10.3 mg/dL   GFR calc non Af Amer 5 (L) >60 mL/min   GFR calc Af Amer 6 (L) >60 mL/min   Anion gap 9 5 - 15    Comment: [facility] at [facility] [address], [place] [address] [facility] and screen [facility]  Status: None (Preliminary result)   Collection Time: 02/03/19 12:30 AM  Result Value Ref Range   ABO/RH(D) B POS    Antibody Screen NEG    Sample Expiration      02/06/2019,2359 Performed at [facility], [address] [place], [place] [address]    Unit Number [id number]    Blood Component Type RED CELLS,LR    Unit division 00    Status of Unit ALLOCATED    Transfusion Status OK TO TRANSFUSE    Crossmatch Result Compatible   Troponin I - ONCE - STAT     Status: Abnormal   Collection Time: 02/03/19 12:30 AM  Result Value Ref Range   Troponin I 0.09 (HH) <0.03 ng/mL    Comment: CRITICAL RESULT CALLED TO, READ BACK BY AND VERIFIED WITH: NICKOLS,K @ 5320 ON 02/03/19 BY JUW Performed at [facility], [address] [place], [place] [address]   Prepare RBC     Status: None   Collection Time: 02/03/19 12:30 AM  Result Value Ref Range   Order Confirmation      ORDER PROCESSED BY BLOOD BANK Performed at [facility], [address] [place], [place] [address]     Chemistries  Recent Labs  Lab 01/29/19 1013 02/03/19 0030  NA 141 138  K 4.0 3.4*  CL 97* 101  CO2 27 28  GLUCOSE 97 108*  BUN 31* 39*  CREATININE 7.13* 7.97*  CALCIUM 9.2 9.0  AST 13*  --   ALT 11  --   ALKPHOS 65  --   BILITOT 0.6  --     ------------------------------------------------------------------------------------------------------------------  ------------------------------------------------------------------------------------------------------------------ GFR: Estimated Creatinine Clearance: 7.5 mL/min (A) (by C-G formula based on SCr of 7.97 mg/dL (H)). Liver Function Tests: Recent Labs  Lab 01/29/19 1013  AST 13*  ALT 11  ALKPHOS 65  BILITOT 0.6  PROT 6.9  ALBUMIN 3.2*   No results for input(s): LIPASE, AMYLASE in the last 168 hours. No results for input(s): AMMONIA in the last 168 hours. Coagulation Profile: No results for input(s): INR, PROTIME in the last 168 hours. Cardiac Enzymes: Recent Labs  Lab 02/03/19 0030  TROPONINI 0.09*   BNP (last 3 results) No results for input(s): PROBNP in the last 8760 hours. HbA1C: No results for input(s): HGBA1C in the last 72 hours. CBG: No results for input(s): GLUCAP in the last 168 hours. Lipid Profile: No results for input(s): CHOL, HDL, LDLCALC, TRIG, CHOLHDL, LDLDIRECT in the last 72 hours. Thyroid Function Tests: No results for input(s): TSH, T4TOTAL, FREET4, T3FREE, THYROIDAB in the last 72 hours. Anemia Panel: No results for input(s): VITAMINB12, FOLATE, FERRITIN, TIBC, IRON, RETICCTPCT in the last 72 hours.  --------------------------------------------------------------------------------------------------------------- Urine analysis:    Component Value Date/Time   COLORURINE YELLOW 10/03/2016 0704   APPEARANCEUR CLEAR 10/03/2016 0704   LABSPEC 1.010 10/03/2016 0704   PHURINE 6.0 10/03/2016 0704   GLUCOSEU 50 (A) 10/03/2016 0704   HGBUR NEGATIVE 10/03/2016 0704   BILIRUBINUR NEGATIVE 10/03/2016 0704   KETONESUR NEGATIVE 10/03/2016 0704   PROTEINUR 100 (A) 10/03/2016 0704   UROBILINOGEN 0.2 08/19/2011 1103   NITRITE NEGATIVE 10/03/2016 0704   LEUKOCYTESUR NEGATIVE 10/03/2016 0704      Imaging Results:    Ct Angio Chest Pe W And/or  Wo Contrast  Result Date: 02/03/2019 CLINICAL DATA:  Shortness of breath for 2 weeks. PE suspected, high pretest prob. Breast cancer, finished chemotherapy 2 weeks ago. EXAM: CT ANGIOGRAPHY CHEST WITH CONTRAST TECHNIQUE: Multidetector CT imaging of the chest  was performed using the standard protocol during bolus administration of intravenous contrast. Multiplanar CT image reconstructions and MIPs were obtained to evaluate the vascular anatomy. CONTRAST:  154m OMNIPAQUE IOHEXOL 350 MG/ML SOLN COMPARISON:  Radiograph earlier this day. FINDINGS: Cardiovascular: There are no filling defects within the pulmonary arteries to suggest pulmonary embolus. Aortic atherosclerosis without dissection. Cardiomegaly. No pericardial effusion. Mild contrast refluxing into the hepatic veins and IVC. Mediastinum/Nodes: Shotty mediastinal and hilar lymph nodes, all subcentimeter and likely reactive. Esophagus is decompressed. No visualized thyroid nodule. No axillary adenopathy. Surgical clips in the right axilla. Lungs/Pleura: Bilateral symmetric perihilar ground-glass opacities, smooth septal thickening, and bronchial thickening consistent with pulmonary edema. Small bilateral pleural effusions with adjacent compressive atelectasis. Subpleural 5 mm nodule right upper lobe, image 68 series 6. 6 mm nodule anterior right upper lobe image 39. Upper Abdomen: Bilateral renal atrophy, patient with chronic renal disease on dialysis. High-density material in the colon of unknown etiology. Musculoskeletal: There are no acute or suspicious osseous abnormalities. Review of the MIP images confirms the above findings. IMPRESSION: 1. No pulmonary embolus. 2. Pulmonary edema and small bilateral pleural effusions. 3. Right upper lobe pulmonary nodules, largest measuring 6 mm. Non-contrast chest CT at 3-6 months is recommended. If the nodules are stable at time of repeat CT, then future CT at 18-24 months (from today's scan) is considered optional  for low-risk patients, but is recommended for high-risk patients. This recommendation follows the consensus statement: Guidelines for Management of Incidental Pulmonary Nodules Detected on CT Images: From the Fleischner Society 2017; Radiology 2017; 284:228-243. Aortic Atherosclerosis (ICD10-I70.0). Electronically Signed   By: MKeith RakeM.D.   On: 02/03/2019 02:48   Dg Chest Portable 1 View  Result Date: 02/03/2019 CLINICAL DATA:  Shortness of breath EXAM: PORTABLE CHEST 1 VIEW COMPARISON:  Chest radiograph 05/09/2018 FINDINGS: Mild cardiomegaly with mild interstitial pulmonary edema. No pleural effusion or pneumothorax. IMPRESSION: Mild cardiomegaly and interstitial pulmonary edema. Electronically Signed   By: KUlyses JarredM.D.   On: 02/03/2019 01:09     Assessment & Plan:    Principal Problem:   Dyspnea Active Problems:   Essential hypertension, benign   Type 2 diabetes mellitus (HCC)   Elevated troponin   ESRD (end stage renal disease) (HCC)   Acute diastolic CHF (congestive heart failure) (HCC)   Hypokalemia  Acute Respiratory Failure w hypoxia Secondary to fluid overload, acute diastolic CHF  Acute Diastolic CHF Tele Trop I [id number], check TSH Check cardiac echo Nephrology consult to see if needs dialysis today. Consult requested in computer, appreciate input  Elevated trop ? Demand ischemia, ? 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 KatragDelton Coombesepeat CT chest in 3-6 months  Gerd  Cont PPI  Chronic ? Pain/ neuropathy Cont duloxetine 30mg p41may Cont Gabapentin 300mg po65m   DVT Prophylaxis-   heparin- SCDs    AM Labs Ordered, also please review Full Orders  Family Communication: Admission, patients condition and plan of care including tests being ordered have been discussed with the patient and her husband who indicate understanding and agree with the plan and Code Status.  Code Status:  FULL CODE,  Spoke with her husband and let him know that she was being admitted for anemia and CHF  Admission status: Observation : Based on patients clinical presentation and evaluation of above clinical data, I have made determination that patient meets observation criteria at this time. Pt has high risk of clinical deterioration without admission. Pt will require iv lasix as well as possibly early dialysis to treat acute diastolic CHF. Pt has troponin elevation the significance of which is still to be determined.  Pt may require change to inpatient status depending upon how she progresses clinically.   Time spent in minutes : 70   Jani Gravel M.D on 02/03/2019 at 3:19 AM

## 2019-02-03 NOTE — ED Notes (Signed)
Pt to ct 

## 2019-02-03 NOTE — Procedures (Signed)
    HEMODIALYSIS TREATMENT NOTE:  4 hour low-heparin dialysis treatment completed via left forearm AVF (15g ante/retrograde).  Ultrafiltration inhibited by hypotension (SBP 80s) halfway through session--discussed with Dr. Jimmy Footman; UF suspended for remainder of session.  All blood was returned and hemostasis was achieved in 15 minutes. Net UF 1.9 liters.  Rockwell Alexandria, RN

## 2019-02-03 NOTE — Progress Notes (Signed)
Blood pressure noted to be 175/100 however patient scheduled to for dialysis. Called and spoke to [place], dialysis nurse and she said she would be her within the next hour for dialysis. Patient stated that she had problems in the past with her blood pressure dropping. Will continue to monitor patient and let MD know about situation.

## 2019-02-03 NOTE — Progress Notes (Signed)
*  PRELIMINARY RESULTS* Echocardiogram 2D Echocardiogram has been performed.  Megan [name] 02/03/2019, 12:55 PM

## 2019-02-04 DIAGNOSIS — R0600 Dyspnea, unspecified: Secondary | ICD-10-CM | POA: Diagnosis not present

## 2019-02-04 LAB — CBC
HCT: 24.7 % — ABNORMAL LOW (ref 36.0–46.0)
Hemoglobin: 7.8 g/dL — ABNORMAL LOW (ref 12.0–15.0)
MCH: 32 pg (ref 26.0–34.0)
MCHC: 31.6 g/dL (ref 30.0–36.0)
MCV: 101.2 fL — ABNORMAL HIGH (ref 80.0–100.0)
Platelets: 309 10*3/uL (ref 150–400)
RBC: 2.44 MIL/uL — ABNORMAL LOW (ref 3.87–5.11)
RDW: 18.3 % — ABNORMAL HIGH (ref 11.5–15.5)
WBC: 8.3 10*3/uL (ref 4.0–10.5)
nRBC: 0 % (ref 0.0–0.2)

## 2019-02-04 LAB — TYPE AND SCREEN
ABO/RH(D): B POS
Antibody Screen: NEGATIVE
Unit division: 0

## 2019-02-04 LAB — BPAM RBC
Blood Product Expiration Date: 202007082359
ISSUE DATE / TIME: 202006220637
Unit Type and Rh: 9500

## 2019-02-04 LAB — GLUCOSE, CAPILLARY
Glucose-Capillary: 110 mg/dL — ABNORMAL HIGH (ref 70–99)
Glucose-Capillary: 106 mg/dL — ABNORMAL HIGH (ref 70–99)
Glucose-Capillary: 74 mg/dL (ref 70–99)

## 2019-02-04 LAB — RENAL FUNCTION PANEL
Albumin: 2.8 g/dL — ABNORMAL LOW (ref 3.5–5.0)
Anion gap: 9 (ref 5–15)
BUN: 27 mg/dL — ABNORMAL HIGH (ref 6–20)
CO2: 29 mmol/L (ref 22–32)
Calcium: 8.5 mg/dL — ABNORMAL LOW (ref 8.9–10.3)
Chloride: 100 mmol/L (ref 98–111)
Creatinine, Ser: 5.42 mg/dL — ABNORMAL HIGH (ref 0.44–1.00)
GFR calc Af Amer: 9 mL/min — ABNORMAL LOW (ref >60)
GFR calc non Af Amer: 8 mL/min — ABNORMAL LOW (ref >60)
Glucose, Bld: 96 mg/dL (ref 70–99)
Phosphorus: 3.9 mg/dL (ref 2.5–4.6)
Potassium: 4.2 mmol/L (ref 3.5–5.1)
Sodium: 138 mmol/L (ref 135–145)

## 2019-02-04 LAB — HIV ANTIBODY (ROUTINE TESTING W REFLEX): HIV Screen 4th Generation wRfx: NONREACTIVE

## 2019-02-04 LAB — HEMOGLOBIN A1C
Hgb A1c MFr Bld: 5.8 % — ABNORMAL HIGH (ref 4.8–5.6)
Mean Plasma Glucose: 120 mg/dL

## 2019-02-04 MED ORDER — CHLORHEXIDINE GLUCONATE CLOTH 2 % EX PADS
6.0000 | MEDICATED_PAD | Freq: Every day | CUTANEOUS | Status: DC
  Administered 2019-02-04: 6 via TOPICAL

## 2019-02-04 MED ORDER — DARBEPOETIN ALFA 200 MCG/0.4ML IJ SOSY
200.0000 ug | PREFILLED_SYRINGE | INTRAMUSCULAR | Status: DC
  Administered 2019-02-04: 200 ug via INTRAVENOUS
  Filled 2019-02-04: qty 0.4

## 2019-02-04 NOTE — Procedures (Signed)
    HEMODIALYSIS TREATMENT NOTE:  4 hour low heparin dialysis completed via left forearm AVF (15g ante/retrograde). Goal met: 1.5 liters removed without interruption in ultrafiltration.  Hemodynamically stable throughout HD session.  All blood was returned and hemostasis was achieved in 15 minutes.    Rockwell Alexandria, RN

## 2019-02-04 NOTE — Discharge Summary (Signed)
Physician Discharge Summary  SHANYIA STINES ZWC:[id number] DOB: [birth year] DOA: 02/02/2019  PCP: [name], MD  Admit date: 02/02/2019  Discharge date: 02/04/2019  Admitted From:Home  Disposition:  Home  Recommendations for Outpatient Follow-up:  1. Follow up with PCP in 1-2 weeks 2. Follow-up with Dr. Delton Coombes in 4 weeks and obtain repeat CT chest in 3 to 6 months to follow-up lung nodules to the right upper lobe 3. Continue on usual home medications 4. Last hemodialysis performed 6/23 prior to discharge, patient may return to usual schedule of Tuesday, Thursday, Saturday  Home Health: None  Equipment/Devices: None  Discharge Condition: Stable  CODE STATUS: Full  Diet recommendation: Heart Healthy/carb modified  Brief/Interim Summary: Per HPI: SharonRichardsonis a57 y.o.female,w hypertension, hyperlipidemia, Dm2 with neuropathy, h/o CVA (lacunar on MRI 05/26/2018) , ESRD on HD (T, T, S), h/o temporal arteritis, osteopenia, H/o R Breast Cancer (ER+PR-HER2-) s/p R mastectomy 09/18/2018 apparently c/o dyspnea x 1 week. Worse over the past few days. + dyspnea with exertion. Slight dry cough, and slight chest discomfort only with cough. Pt denies fever, chills,cp, palp, orthopnea, pnd, n/v, abd pain, diarrhea, brbpr. Pt notes that she has been eating some salty food as well as her sbp 154 at home. Pt did go for dialysis on Saturday and completed the whole session. Pt states her weight has been stable and no significant lower ext edema. Pt was notified that her Hgb was 7.4 and told to go to ER if dyspnea worsened and therefore presented to ED  Review of records shows Nuclear stress test 06/11/2017 => EF 52%, low risk scan  Patient was admitted with acute hypoxemic respiratory failure that is questionably related to some volume overload.  She was noted to have some anemia and underwent of 1 unit PRBC transfusion with stable hemoglobin levels otherwise noted.   Some Aranesp was added by nephrology and she underwent ultrafiltration of 1.9 L of fluid on 6/22 with repeat hemodialysis on day of discharge.  She is overall feeling much better without any symptomatic complaints or concerns or dyspnea noted at this time.  She will follow-up with Dr. Delton Coombes for repeat CT chest as well as her PCP.  She will maintain her usual hemodialysis schedule as noted above.  Her hemoglobin on day of discharge is 7.8 and tends to stay in the 7-8 range according to chart review.  No other acute events noted during this brief admission.  Of note, there was some concern of chest pain and troponin trend has remained flat.  2D echocardiogram with no wall motion abnormalities and LVEF of 55-60% noted.   Discharge Diagnoses:  Principal Problem:   Dyspnea Active Problems:   Essential hypertension, benign   Type 2 diabetes mellitus (HCC)   Elevated troponin   ESRD (end stage renal disease) (HCC)   Acute diastolic CHF (congestive heart failure) (HCC)   Hypokalemia   Acute respiratory failure with hypoxia [facility])    Discharge Instructions  Discharge Instructions    Diet - low sodium heart healthy   Complete by: As directed    Increase activity slowly   Complete by: As directed      Allergies as of 02/04/2019   No Known Allergies     Medication List    TAKE these medications   amLODipine 5 MG tablet Commonly known as: NORVASC Take 1 tablet (5 mg total) by mouth daily. What changed:   when to take this  reasons to take this   anastrozole 1 MG  tablet Commonly known as: ARIMIDEX Take 1 tablet (1 mg total) by mouth daily.   aspirin EC 81 MG tablet Take 81 mg by mouth daily.   atorvastatin 20 MG tablet Commonly known as: LIPITOR Take 1 tablet (20 mg total) by mouth daily. What changed: when to take this   butalbital-acetaminophen-caffeine 50-325-40 MG tablet Commonly known as: FIORICET Take 1 tablet by mouth every 6 (six) hours as needed for headache.    cinacalcet 30 MG tablet Commonly known as: SENSIPAR Take 2 tablets (60 mg total) by mouth daily. What changed: how much to take   Dialyvite 800 0.8 MG Tabs Take 800 mg by mouth daily.   DULoxetine 30 MG capsule Commonly known as: Cymbalta Take 1 capsule (30 mg total) by mouth daily.   gabapentin 300 MG capsule Commonly known as: NEURONTIN TAKE 1 CAPSULE(300 MG) BY MOUTH AT BEDTIME   HYDROcodone-acetaminophen 5-325 MG tablet Commonly known as: Norco Take 1 tablet by mouth every 4 (four) hours as needed for moderate pain.   lanthanum 1000 MG chewable tablet Commonly known as: FOSRENOL Chew 1,000-2,000 mg by mouth See admin instructions. Take 2000 mg by mouth twice daily with meals and 1000 with a snack   lidocaine-prilocaine cream Commonly known as: EMLA APPLY SMALL AMOUNT TO ACCESS SITE (AVF) 1 TO 2 HOURS BEFORE DIALYSIS. COVER WITH OCCLUSIVE DRESSING (SARAN WRAP)   losartan 50 MG tablet Commonly known as: COZAAR Take 1 tablet (50 mg total) by mouth daily. What changed: when to take this   nebivolol 10 MG tablet Commonly known as: Bystolic Take 1 tablet (10 mg total) by mouth 2 (two) times daily. What changed:   when to take this  additional instructions   ondansetron 4 MG tablet Commonly known as: Zofran Take 1 tablet (4 mg total) by mouth every 8 (eight) hours as needed for nausea or vomiting.   pantoprazole 40 MG tablet Commonly known as: PROTONIX TAKE 1 TABLET(40 MG) BY MOUTH DAILY What changed: See the new instructions.   prochlorperazine 10 MG tablet Commonly known as: COMPAZINE Take 1 tablet (10 mg total) by mouth every 6 (six) hours as needed (Nausea or vomiting).   tiZANidine 4 MG capsule Commonly known as: ZANAFLEX TAKE 1 CAPSULE(4 MG) BY MOUTH TWICE DAILY WITH A MEAL. START 1 CAPSULE AT NIGHT FOR 2 WEEKS AND THEN TWICE DAILY      Follow-up Information    [name], MD Follow up in 1 week(s).   Specialty: Family Medicine Contact  information: [address] [place] [address] [phone]        Derek Jack, MD Follow up in 4 week(s).   Specialty: Hematology Contact information: [address] [place] [address] [phone]          No Known Allergies  Consultations:  Nephrology   Procedures/Studies: Ct Angio Chest Pe W And/or Wo Contrast  Result Date: 02/03/2019 CLINICAL DATA:  Shortness of breath for 2 weeks. PE suspected, high pretest prob. Breast cancer, finished chemotherapy 2 weeks ago. EXAM: CT ANGIOGRAPHY CHEST WITH CONTRAST TECHNIQUE: Multidetector CT imaging of the chest was performed using the standard protocol during bolus administration of intravenous contrast. Multiplanar CT image reconstructions and MIPs were obtained to evaluate the vascular anatomy. CONTRAST:  137m OMNIPAQUE IOHEXOL 350 MG/ML SOLN COMPARISON:  Radiograph earlier this day. FINDINGS: Cardiovascular: There are no filling defects within the pulmonary arteries to suggest pulmonary embolus. Aortic atherosclerosis without dissection. Cardiomegaly. No pericardial effusion. Mild contrast refluxing  into the hepatic veins and IVC. Mediastinum/Nodes: Shotty mediastinal and hilar lymph nodes, all subcentimeter and likely reactive. Esophagus is decompressed. No visualized thyroid nodule. No axillary adenopathy. Surgical clips in the right axilla. Lungs/Pleura: Bilateral symmetric perihilar ground-glass opacities, smooth septal thickening, and bronchial thickening consistent with pulmonary edema. Small bilateral pleural effusions with adjacent compressive atelectasis. Subpleural 5 mm nodule right upper lobe, image 68 series 6. 6 mm nodule anterior right upper lobe image 39. Upper Abdomen: Bilateral renal atrophy, patient with chronic renal disease on dialysis. High-density material in the colon of unknown etiology. Musculoskeletal: There are no acute or suspicious osseous abnormalities. Review of the MIP images confirms the  above findings. IMPRESSION: 1. No pulmonary embolus. 2. Pulmonary edema and small bilateral pleural effusions. 3. Right upper lobe pulmonary nodules, largest measuring 6 mm. Non-contrast chest CT at 3-6 months is recommended. If the nodules are stable at time of repeat CT, then future CT at 18-24 months (from today's scan) is considered optional for low-risk patients, but is recommended for high-risk patients. This recommendation follows the consensus statement: Guidelines for Management of Incidental Pulmonary Nodules Detected on CT Images: From the Fleischner Society 2017; Radiology 2017; 284:228-243. Aortic Atherosclerosis (ICD10-I70.0). Electronically Signed   By: Keith Rake M.D.   On: 02/03/2019 02:48   Dg Bone Density  Result Date: 01/20/2019 EXAM: DUAL X-RAY ABSORPTIOMETRY (DXA) FOR BONE MINERAL DENSITY IMPRESSION: Ordering Physician:  Dr. Derek Jack, Your patient Azalea Cedar completed a BMD test on 01/20/2019 using the [place] (software version: 14.10) manufactured by [employer]. The following summarizes the results of our evaluation. Technologist:TNB PATIENT BIOGRAPHICAL: Name: ISLEY, WEISHEIT Patient ID: [id number] Birth Date: May 06, 1961 Height: 62.0 in. Gender: Female Exam Date: 01/20/2019 Weight: 169.0 lbs. Indications: Breast Ca, Low Calcium Intake, Parent Hip Fracture, Post Menopausal, Family Hist. (Parent hip fracture) Fractures: Treatments: DENSITOMETRY RESULTS: Site      Region     Measured Date Measured Age WHO Classification Young Adult T-score BMD         %Change vs. Previous Significant Change (*) AP Spine L1-L4 01/20/2019 56.8 Normal 0.2 1.208 g/cm2 DualFemur Total Left 01/20/2019 56.8 Osteopenia -1.9 0.774 g/cm2 ASSESSMENT: The BMD measured at Femur Total Left is 0.774 g/cm2 with a T-score of -1.9. This patient is considered osteopenic according to [facility]) criteria. The scan quality is good. World Social worker [facility]) criteria for post-menopausal, Caucasian Women: Normal:       T-score at or above -1 SD Osteopenia:   T-score between -1 and -2.5 SD Osteoporosis: T-score at or below -2.5 SD RECOMMENDATIONS: 1. All patients should optimize calcium and vitamin D intake. 2. Consider FDA-approved medical therapies in postmenopausal women and med aged 27 years and older, based on the following: a. A hip or vertebral (clinical or morphometric) fracture b. T-score< -2.5 at the femoral neck or spine after appropriate evaluation to exclude secondary causes c. Low bone mass (T-score between -1.0 and -2.5 at the femoral neck or spine) and a 10-year probability of a hip fracture > 3% or a 10-year probability of a major osteoporosis-related fracture > 20% based on the [place]-adapted WHO algorithm d. Clinician judgment and/or patient preferences may indicate treatment for people with 10-year fracture probabilities above or below these levels FOLLOW-UP: People with diagnosed cases of osteoporosis or at high risk for fracture should have regular bone mineral density tests. For patients eligible for Medicare, routine testing is allowed once every 2 years. The testing  frequency can be increased to one year for patients who have rapidly progressing disease, those who are receiving or discontinuing medical therapy to restore bone mass, or have additional risk factors. I have reviewed this report, and agree with the above findings. [facility] Your patient Carmelita Amparo completed a FRAX assessment on 01/20/2019 using the [place] (analysis version: 14.10) manufactured by [employer]. The following summarizes the results of our evaluation. PATIENT BIOGRAPHICAL: Name: ARDENA, GANGL Patient ID: [id number] Birth Date: November 25, 1960 Height:    62.0 in. Gender:     Female    [age]        [age]       Weight:    169.0 lbs. Ethnicity:  Black                            Exam Date: 01/20/2019 FRAX* RESULTS:   (version: 3.5) 10-year Probability of Fracture1 Major Osteoporotic Fracture2 Hip Fracture 6.3% 0.3% Population: [place] (Black) Risk Factors: Family Hist. (Parent hip fracture) Based on Femur (Right) Neck BMD 1 -The 10-year probability of fracture may be lower than reported if the patient has received treatment. 2 -Major Osteoporotic Fracture: Clinical Spine, Forearm, Hip or Shoulder *FRAX is a Materials engineer of the [employer] for Metabolic Bone Disease, a [place] (WHO) [facility]. ASSESSMENT: The probability of a major osteoporotic fracture is 6.3% within the next ten years. The probability of a hip fracture is 0.3% within the next ten years. Electronically Signed   By: Lowella Grip III M.D.   On: 01/20/2019 10:03   Dg Chest Portable 1 View  Result Date: 02/03/2019 CLINICAL DATA:  Shortness of breath EXAM: PORTABLE CHEST 1 VIEW COMPARISON:  Chest radiograph 05/09/2018 FINDINGS: Mild cardiomegaly with mild interstitial pulmonary edema. No pleural effusion or pneumothorax. IMPRESSION: Mild cardiomegaly and interstitial pulmonary edema. Electronically Signed   By: Ulyses Jarred M.D.   On: 02/03/2019 01:09     Discharge Exam: Vitals:   02/04/19 0549 02/04/19 0941  BP: 122/76   Pulse: 80   Resp: 20   Temp: 98.3 F (36.8 C)   SpO2: 100% 99%   Vitals:   02/03/19 1700 02/03/19 2114 02/04/19 0549 02/04/19 0941  BP: (!) 86/52 (!) 145/92 122/76   Pulse: 85 86 80   Resp:  20 20   Temp:  98.6 F (37 C) 98.3 F (36.8 C)   TempSrc:  Oral Oral   SpO2:  100% 100% 99%  Weight:   76.8 kg   Height:        General: Pt is alert, awake, not in acute distress Cardiovascular: RRR, S1/S2 +, no rubs, no gallops Respiratory: CTA bilaterally, no wheezing, no rhonchi Abdominal: Soft, NT, ND, bowel sounds + Extremities: no edema, no cyanosis    The results of significant diagnostics from this hospitalization (including imaging, microbiology,  ancillary and laboratory) are listed below for reference.     Microbiology: Recent Results (from the past 240 hour(s))  SARS Coronavirus 2 (CEPHEID- Performed in [place] hospital lab), Hosp Order     Status: None   Collection Time: 02/03/19  3:15 AM   Specimen: Nasopharyngeal Swab  Result Value Ref Range Status   SARS Coronavirus 2 NEGATIVE NEGATIVE Final    Comment: (NOTE) If result is NEGATIVE SARS-CoV-2 target nucleic acids are NOT DETECTED. The SARS-CoV-2 RNA is generally detectable in upper and lower  respiratory specimens during  the acute phase of infection. The lowest  concentration of SARS-CoV-2 viral copies this assay can detect is 250  copies / mL. A negative result does not preclude SARS-CoV-2 infection  and should not be used as the sole basis for treatment or other  patient management decisions.  A negative result may occur with  improper specimen collection / handling, submission of specimen other  than nasopharyngeal swab, presence of viral mutation(s) within the  areas targeted by this assay, and inadequate number of viral copies  (<250 copies / mL). A negative result must be combined with clinical  observations, patient history, and epidemiological information. If result is POSITIVE SARS-CoV-2 target nucleic acids are DETECTED. The SARS-CoV-2 RNA is generally detectable in upper and lower  respiratory specimens dur ing the acute phase of infection.  Positive  results are indicative of active infection with SARS-CoV-2.  Clinical  correlation with patient history and other diagnostic information is  necessary to determine patient infection status.  Positive results do  not rule out bacterial infection or co-infection with other viruses. If result is PRESUMPTIVE POSTIVE SARS-CoV-2 nucleic acids MAY BE PRESENT.   A presumptive positive result was obtained on the submitted specimen  and confirmed on repeat testing.  While 2019 novel coronavirus  (SARS-CoV-2)  nucleic acids may be present in the submitted sample  additional confirmatory testing may be necessary for epidemiological  and / or clinical management purposes  to differentiate between  SARS-CoV-2 and other Sarbecovirus currently known to infect humans.  If clinically indicated additional testing with an alternate test  methodology [phone]) is advised. The SARS-CoV-2 RNA is generally  detectable in upper and lower respiratory sp ecimens during the acute  phase of infection. The expected result is Negative. Fact Sheet for Patients:  StrictlyIdeas.no Fact Sheet for Healthcare Providers: BankingDealers.co.za This test is not yet approved or cleared by the [place] FDA and has been authorized for detection and/or diagnosis of SARS-CoV-2 by FDA under an Emergency Use Authorization (EUA).  This EUA will remain in effect (meaning this test can be used) for the duration of the COVID-19 declaration under Section 564(b)(1) of the Act, 21 U.S.C. section 360bbb-3(b)(1), unless the authorization is terminated or revoked sooner. Performed at [facility], [address] [place], [place] [address]      Labs: BNP (last 3 results) Recent Labs    02/03/19 0030  BNP 5,885.0*   Basic Metabolic Panel: Recent Labs  Lab 01/29/19 1013 02/03/19 0030 02/03/19 0443 02/04/19 0522  NA 141 138 137 138  K 4.0 3.4* 3.8 4.2  CL 97* 101 99 100  CO2 27 28 25 29   GLUCOSE 97 108* 117* 96  BUN 31* 39* 41* 27*  CREATININE 7.13* 7.97* 8.16* 5.42*  CALCIUM 9.2 9.0 9.2 8.5*  PHOS  --   --   --  3.9   Liver Function Tests: Recent Labs  Lab 01/29/19 1013 02/03/19 0443 02/04/19 0522  AST 13* 34  --   ALT 11 31  --   ALKPHOS 65 88  --   BILITOT 0.6 0.5  --   PROT 6.9 7.0  --   ALBUMIN 3.2* 3.3* 2.8*   No results for input(s): LIPASE, AMYLASE in the last 168 hours. No results for input(s): AMMONIA in the last 168 hours. CBC: Recent Labs  Lab  01/29/19 1013 02/03/19 0030 02/03/19 0443 02/04/19 0522  WBC 8.9 10.3 13.2* 8.3  NEUTROABS 5.8 6.8  --   --   HGB 7.6*  7.0* 7.4* 7.8*  HCT 24.5* 23.0* 24.9* 24.7*  MCV 105.2* 103.1* 105.1* 101.2*  PLT 386 333 402* 309   Cardiac Enzymes: Recent Labs  Lab 02/03/19 0030 02/03/19 0443 02/03/19 1105 02/03/19 1645  TROPONINI 0.09* 0.11* 0.11* 0.08*   BNP: Invalid input(s): POCBNP CBG: Recent Labs  Lab 02/03/19 0741 02/03/19 1133 02/03/19 1611 02/03/19 2153 02/04/19 0721  GLUCAP 105* 87 103* 92 110*   D-Dimer No results for input(s): DDIMER in the last 72 hours. Hgb A1c Recent Labs    02/03/19 0443  HGBA1C 5.8*   Lipid Profile No results for input(s): CHOL, HDL, LDLCALC, TRIG, CHOLHDL, LDLDIRECT in the last 72 hours. Thyroid function studies Recent Labs    02/03/19 0443  TSH 1.837   Anemia work up No results for input(s): VITAMINB12, FOLATE, FERRITIN, TIBC, IRON, RETICCTPCT in the last 72 hours. Urinalysis    Component Value Date/Time   COLORURINE YELLOW 10/03/2016 0704   APPEARANCEUR CLEAR 10/03/2016 0704   LABSPEC 1.010 10/03/2016 0704   PHURINE 6.0 10/03/2016 0704   GLUCOSEU 50 (A) 10/03/2016 0704   HGBUR NEGATIVE 10/03/2016 0704   BILIRUBINUR NEGATIVE 10/03/2016 0704   KETONESUR NEGATIVE 10/03/2016 0704   PROTEINUR 100 (A) 10/03/2016 0704   UROBILINOGEN 0.2 08/19/2011 1103   NITRITE NEGATIVE 10/03/2016 0704   LEUKOCYTESUR NEGATIVE 10/03/2016 0704   Sepsis Labs Invalid input(s): PROCALCITONIN,  WBC,  LACTICIDVEN Microbiology Recent Results (from the past 240 hour(s))  SARS Coronavirus 2 (CEPHEID- Performed in [place] hospital lab), Hosp Order     Status: None   Collection Time: 02/03/19  3:15 AM   Specimen: Nasopharyngeal Swab  Result Value Ref Range Status   SARS Coronavirus 2 NEGATIVE NEGATIVE Final    Comment: (NOTE) If result is NEGATIVE SARS-CoV-2 target nucleic acids are NOT DETECTED. The SARS-CoV-2 RNA is generally detectable in  upper and lower  respiratory specimens during the acute phase of infection. The lowest  concentration of SARS-CoV-2 viral copies this assay can detect is 250  copies / mL. A negative result does not preclude SARS-CoV-2 infection  and should not be used as the sole basis for treatment or other  patient management decisions.  A negative result may occur with  improper specimen collection / handling, submission of specimen other  than nasopharyngeal swab, presence of viral mutation(s) within the  areas targeted by this assay, and inadequate number of viral copies  (<250 copies / mL). A negative result must be combined with clinical  observations, patient history, and epidemiological information. If result is POSITIVE SARS-CoV-2 target nucleic acids are DETECTED. The SARS-CoV-2 RNA is generally detectable in upper and lower  respiratory specimens dur ing the acute phase of infection.  Positive  results are indicative of active infection with SARS-CoV-2.  Clinical  correlation with patient history and other diagnostic information is  necessary to determine patient infection status.  Positive results do  not rule out bacterial infection or co-infection with other viruses. If result is PRESUMPTIVE POSTIVE SARS-CoV-2 nucleic acids MAY BE PRESENT.   A presumptive positive result was obtained on the submitted specimen  and confirmed on repeat testing.  While 2019 novel coronavirus  (SARS-CoV-2) nucleic acids may be present in the submitted sample  additional confirmatory testing may be necessary for epidemiological  and / or clinical management purposes  to differentiate between  SARS-CoV-2 and other Sarbecovirus currently known to infect humans.  If clinically indicated additional testing with an alternate test  methodology [id number]) is advised. The SARS-CoV-2  RNA is generally  detectable in upper and lower respiratory sp ecimens during the acute  phase of infection. The expected result is  Negative. Fact Sheet for Patients:  StrictlyIdeas.no Fact Sheet for Healthcare Providers: BankingDealers.co.za This test is not yet approved or cleared by the [place] FDA and has been authorized for detection and/or diagnosis of SARS-CoV-2 by FDA under an Emergency Use Authorization (EUA).  This EUA will remain in effect (meaning this test can be used) for the duration of the COVID-19 declaration under Section 564(b)(1) of the Act, 21 U.S.C. section 360bbb-3(b)(1), unless the authorization is terminated or revoked sooner. Performed at [facility], [address], [place], [place] [address]      Time coordinating discharge: 35 minutes  SIGNED:   Rodena Goldmann, DO [facility] 02/04/2019, 10:10 AM  If 7PM-7AM, please contact night-coverage [email or web address] Password [email or web address]

## 2019-02-04 NOTE — Progress Notes (Signed)
Subjective: Interval History: has no complaint, feels a lot better, wants to go home.  Objective: Vital signs in last 24 hours: Temp:  [97.7 F (36.5 C)-98.6 F (37 C)] 98.3 F (36.8 C) (06/23 0549) Pulse Rate:  [80-90] 80 (06/23 0549) Resp:  [20-22] 20 (06/23 0549) BP: (80-181)/(41-100) 122/76 (06/23 0549) SpO2:  [97 %-100 %] 100 % (06/23 0549) Weight:  [74.8 kg-76.8 kg] 76.8 kg (06/22 1245) Weight change: 0 kg  Intake/Output from previous day: 06/22 0701 - 06/23 0700 In: 200 [P.O.:200] Out: 350 [Urine:350] Intake/Output this shift: No intake/output data recorded.  General appearance: alert, cooperative, no distress and moderately obese Resp: rales bibasilar Cardio: S1, S2 normal and systolic murmur: systolic ejection 2/6, crescendo and decrescendo at 2nd left intercostal space GI: soft, non-tender; bowel sounds normal; no masses,  no organomegaly Extremities: AVF pos B&T, no edema  Lab Results: Recent Labs    02/03/19 0443 02/04/19 0522  WBC 13.2* 8.3  HGB 7.4* 7.8*  HCT 24.9* 24.7*  PLT 402* 309   BMET:  Recent Labs    02/03/19 0030 02/03/19 0443  NA 138 137  K 3.4* 3.8  CL 101 99  CO2 28 25  GLUCOSE 108* 117*  BUN 39* 41*  CREATININE 7.97* 8.16*  CALCIUM 9.0 9.2   No results for input(s): PTH in the last 72 hours. Iron Studies: No results for input(s): IRON, TIBC, TRANSFERRIN, FERRITIN in the last 72 hours.  Studies/Results: Ct Angio Chest Pe W And/or Wo Contrast  Result Date: 02/03/2019 CLINICAL DATA:  Shortness of breath for 2 weeks. PE suspected, high pretest prob. Breast cancer, finished chemotherapy 2 weeks ago. EXAM: CT ANGIOGRAPHY CHEST WITH CONTRAST TECHNIQUE: Multidetector CT imaging of the chest was performed using the standard protocol during bolus administration of intravenous contrast. Multiplanar CT image reconstructions and MIPs were obtained to evaluate the vascular anatomy. CONTRAST:  15mL OMNIPAQUE IOHEXOL 350 MG/ML SOLN COMPARISON:   Radiograph earlier this day. FINDINGS: Cardiovascular: There are no filling defects within the pulmonary arteries to suggest pulmonary embolus. Aortic atherosclerosis without dissection. Cardiomegaly. No pericardial effusion. Mild contrast refluxing into the hepatic veins and IVC. Mediastinum/Nodes: Shotty mediastinal and hilar lymph nodes, all subcentimeter and likely reactive. Esophagus is decompressed. No visualized thyroid nodule. No axillary adenopathy. Surgical clips in the right axilla. Lungs/Pleura: Bilateral symmetric perihilar ground-glass opacities, smooth septal thickening, and bronchial thickening consistent with pulmonary edema. Small bilateral pleural effusions with adjacent compressive atelectasis. Subpleural 5 mm nodule right upper lobe, image 68 series 6. 6 mm nodule anterior right upper lobe image 39. Upper Abdomen: Bilateral renal atrophy, patient with chronic renal disease on dialysis. High-density material in the colon of unknown etiology. Musculoskeletal: There are no acute or suspicious osseous abnormalities. Review of the MIP images confirms the above findings. IMPRESSION: 1. No pulmonary embolus. 2. Pulmonary edema and small bilateral pleural effusions. 3. Right upper lobe pulmonary nodules, largest measuring 6 mm. Non-contrast chest CT at 3-6 months is recommended. If the nodules are stable at time of repeat CT, then future CT at 18-24 months (from today's scan) is considered optional for low-risk patients, but is recommended for high-risk patients. This recommendation follows the consensus statement: Guidelines for Management of Incidental Pulmonary Nodules Detected on CT Images: From the Fleischner Society 2017; Radiology 2017; 284:228-243. Aortic Atherosclerosis (ICD10-I70.0). Electronically Signed   By: Keith Rake M.D.   On: 02/03/2019 02:48   Dg Chest Portable 1 View  Result Date: 02/03/2019 CLINICAL DATA:  Shortness of breath EXAM:  PORTABLE CHEST 1 VIEW COMPARISON:  Chest  radiograph 05/09/2018 FINDINGS: Mild cardiomegaly with mild interstitial pulmonary edema. No pleural effusion or pneumothorax. IMPRESSION: Mild cardiomegaly and interstitial pulmonary edema. Electronically Signed   By: Ulyses Jarred M.D.   On: 02/03/2019 01:09    I have reviewed the patient's current medications.  Assessment/Plan: 1 ESRD vol better, today is usual day, will do HD 2 Anemia add esa 3 HPTH  On Cinn 4 HTn Off meds, does not need 5 Breast CA  6 DM controlled P HD, esa, stop bp meds.   LOS: 0 days   Jeneen Rinks Elara Cocke 02/04/2019,7:11 AM

## 2019-02-04 NOTE — Progress Notes (Signed)
Nsg Discharge Note  Admit Date:  02/02/2019 Discharge date: 02/04/2019   Megan [name] to be D/C'd Home per MD order.  AVS completed.  Copy for chart, and copy for patient signed, and dated. Patient/caregiver able to verbalize understanding.  Discharge Medication: Allergies as of 02/04/2019   No Known Allergies     Medication List    TAKE these medications   amLODipine 5 MG tablet Commonly known as: NORVASC Take 1 tablet (5 mg total) by mouth daily. What changed:   when to take this  reasons to take this   anastrozole 1 MG tablet Commonly known as: ARIMIDEX Take 1 tablet (1 mg total) by mouth daily.   aspirin EC 81 MG tablet Take 81 mg by mouth daily.   atorvastatin 20 MG tablet Commonly known as: LIPITOR Take 1 tablet (20 mg total) by mouth daily. What changed: when to take this   butalbital-acetaminophen-caffeine 50-325-40 MG tablet Commonly known as: FIORICET Take 1 tablet by mouth every 6 (six) hours as needed for headache.   cinacalcet 30 MG tablet Commonly known as: SENSIPAR Take 2 tablets (60 mg total) by mouth daily. What changed: how much to take   Dialyvite 800 0.8 MG Tabs Take 800 mg by mouth daily.   DULoxetine 30 MG capsule Commonly known as: Cymbalta Take 1 capsule (30 mg total) by mouth daily.   gabapentin 300 MG capsule Commonly known as: NEURONTIN TAKE 1 CAPSULE(300 MG) BY MOUTH AT BEDTIME   HYDROcodone-acetaminophen 5-325 MG tablet Commonly known as: Norco Take 1 tablet by mouth every 4 (four) hours as needed for moderate pain.   lanthanum 1000 MG chewable tablet Commonly known as: FOSRENOL Chew 1,000-2,000 mg by mouth See admin instructions. Take 2000 mg by mouth twice daily with meals and 1000 with a snack   lidocaine-prilocaine cream Commonly known as: EMLA APPLY SMALL AMOUNT TO ACCESS SITE (AVF) 1 TO 2 HOURS BEFORE DIALYSIS. COVER WITH OCCLUSIVE DRESSING (SARAN WRAP)   losartan 50 MG tablet Commonly known as: COZAAR Take  1 tablet (50 mg total) by mouth daily. What changed: when to take this   nebivolol 10 MG tablet Commonly known as: Bystolic Take 1 tablet (10 mg total) by mouth 2 (two) times daily. What changed:   when to take this  additional instructions   ondansetron 4 MG tablet Commonly known as: Zofran Take 1 tablet (4 mg total) by mouth every 8 (eight) hours as needed for nausea or vomiting.   pantoprazole 40 MG tablet Commonly known as: PROTONIX TAKE 1 TABLET(40 MG) BY MOUTH DAILY What changed: See the new instructions.   prochlorperazine 10 MG tablet Commonly known as: COMPAZINE Take 1 tablet (10 mg total) by mouth every 6 (six) hours as needed (Nausea or vomiting).   tiZANidine 4 MG capsule Commonly known as: ZANAFLEX TAKE 1 CAPSULE(4 MG) BY MOUTH TWICE DAILY WITH A MEAL. START 1 CAPSULE AT NIGHT FOR 2 WEEKS AND THEN TWICE DAILY       Discharge Assessment: Vitals:   02/04/19 1500 02/04/19 1530  BP: 136/76 137/74  Pulse: 79 81  Resp:    Temp:    SpO2:     Skin clean, dry and intact without evidence of skin break down, no evidence of skin tears noted. IV catheter discontinued intact. Site without signs and symptoms of complications - no redness or edema noted at insertion site, patient denies c/o pain - only slight tenderness at site.  Dressing with slight pressure applied.  D/c Instructions-Education:  Discharge instructions given to patient/family with verbalized understanding. D/c education completed with patient/family including follow up instructions, medication list, d/c activities limitations if indicated, with other d/c instructions as indicated by MD - patient able to verbalize understanding, all questions fully answered. Patient instructed to return to ED, call 911, or call MD for any changes in condition.  Patient escorted via [place], and D/C home via private auto.  Loa Socks, RN 02/04/2019 4:52 PM

## 2019-02-06 DIAGNOSIS — D631 Anemia in chronic kidney disease: Secondary | ICD-10-CM | POA: Diagnosis not present

## 2019-02-06 DIAGNOSIS — E119 Type 2 diabetes mellitus without complications: Secondary | ICD-10-CM | POA: Diagnosis not present

## 2019-02-06 DIAGNOSIS — N2581 Secondary hyperparathyroidism of renal origin: Secondary | ICD-10-CM | POA: Diagnosis not present

## 2019-02-06 DIAGNOSIS — N186 End stage renal disease: Secondary | ICD-10-CM | POA: Diagnosis not present

## 2019-02-06 DIAGNOSIS — D509 Iron deficiency anemia, unspecified: Secondary | ICD-10-CM | POA: Diagnosis not present

## 2019-02-06 DIAGNOSIS — E1129 Type 2 diabetes mellitus with other diabetic kidney complication: Secondary | ICD-10-CM | POA: Diagnosis not present

## 2019-02-07 ENCOUNTER — Ambulatory Visit (INDEPENDENT_AMBULATORY_CARE_PROVIDER_SITE_OTHER): Payer: Medicare Other | Admitting: Family Medicine

## 2019-02-07 ENCOUNTER — Telehealth: Payer: Self-pay | Admitting: Family Medicine

## 2019-02-07 ENCOUNTER — Other Ambulatory Visit: Payer: Self-pay

## 2019-02-07 ENCOUNTER — Encounter: Payer: Self-pay | Admitting: Family Medicine

## 2019-02-07 VITALS — BP 160/76 | HR 84 | Temp 98.7°F | Resp 16 | Ht 62.0 in | Wt 164.0 lb

## 2019-02-07 DIAGNOSIS — N186 End stage renal disease: Secondary | ICD-10-CM | POA: Diagnosis not present

## 2019-02-07 DIAGNOSIS — N185 Chronic kidney disease, stage 5: Secondary | ICD-10-CM

## 2019-02-07 DIAGNOSIS — Z23 Encounter for immunization: Secondary | ICD-10-CM | POA: Diagnosis not present

## 2019-02-07 DIAGNOSIS — Z992 Dependence on renal dialysis: Secondary | ICD-10-CM

## 2019-02-07 DIAGNOSIS — D508 Other iron deficiency anemias: Secondary | ICD-10-CM | POA: Diagnosis not present

## 2019-02-07 DIAGNOSIS — D631 Anemia in chronic kidney disease: Secondary | ICD-10-CM

## 2019-02-07 NOTE — Telephone Encounter (Signed)
Per pt pickard told her to go to her [facility] for some type of injs.   Pt called to set up an app and they told her she would need a new referral from her pcp. Please place so Conner can schedule.

## 2019-02-07 NOTE — Addendum Note (Signed)
Addended by: Shary Decamp B on: 02/07/2019 04:50 PM   Modules accepted: Orders

## 2019-02-07 NOTE — Telephone Encounter (Signed)
Referral placed.

## 2019-02-07 NOTE — Progress Notes (Signed)
Subjective:    Patient ID: Megan [name], female    DOB: 07-01-1961, 58 y.o.   MRN: [id number]  HPI Patient was recently admitted to the hospital with dyspnea.  She has end-stage renal disease and is currently on dialysis.  Recently she was discovered to have a hemoglobin of 7.2.  She was instructed to go to the hospital if she developed dyspnea.  Over the weekend, the patient became very short of breath.  This prompted her to go to the emergency room.  In the emergency room she was given 1 unit of packed red blood cells.  She also underwent dialysis due to possible fluid overload.  Her dyspnea improved dramatically while in the hospital and she was discharged home.  Follow-up appointment with her nephrologist.  Her hemoglobin at the time of discharge was 7.8.  Apparently she received 1 Aranesp injection. Past Medical History:  Diagnosis Date  . Anemia   . Blood transfusion without reported diagnosis   . Cataract   . CKD (chronic kidney disease) stage 3, GFR 30-59 ml/min (HCC)   . ESRD (end stage renal disease) on dialysis ([place])    on HD T, T, S  . Essential hypertension, benign   . GERD (gastroesophageal reflux disease)   . Headache   . Hemorrhoids   . Mixed hyperlipidemia   . PONV (postoperative nausea and vomiting)   . Renal insufficiency   . S/P colonoscopy Jan 2011   Dr. Benson Norway: sessile polyp (benign lymphoid), large hemorrhoids, repeat 5-10 years  . Temporal arteritis ([place])   . Type 2 diabetes mellitus ([place])   . Wears glasses    Past Surgical History:  Procedure Laterality Date  . ABDOMINAL HYSTERECTOMY    . APPENDECTOMY    . ARTERY BIOPSY N/A 05/09/2018   Procedure: RIGHT TEMPORAL ARTERY BIOPSY;  Surgeon: Judeth Horn, MD;  Location: [facility];  Service: General;  Laterality: N/A;  . CATARACT EXTRACTION W/PHACO Left 02/09/2017   Procedure: CATARACT EXTRACTION PHACO AND INTRAOCULAR LENS PLACEMENT LEFT EYE;  Surgeon: Tonny Branch, MD;  Location: [facility];  Service: Ophthalmology;   Laterality: Left;  CDE: 4.89  . CATARACT EXTRACTION W/PHACO Right 06/04/2017   Procedure: CATARACT EXTRACTION PHACO AND INTRAOCULAR LENS PLACEMENT (IOC);  Surgeon: Tonny Branch, MD;  Location: [facility];  Service: Ophthalmology;  Laterality: Right;  CDE: 4.12  . CHOLECYSTECTOMY  09/29/2011   Procedure: LAPAROSCOPIC CHOLECYSTECTOMY;  Surgeon: Jamesetta So, MD;  Location: [facility];  Service: General;  Laterality: N/A;  . COLONOSCOPY  Jan 2011   Dr. Benson Norway: sessile polyp (benign lymphoid), large hemorrhoids, repeat 5-10 years  . COLONOSCOPY N/A 06/12/2016   Procedure: COLONOSCOPY;  Surgeon: Daneil Dolin, MD;  [facility];  Service: Endoscopy;  Laterality: N/A;  1230   . ESOPHAGOGASTRODUODENOSCOPY  09/05/2011   [id number] hiatal hernia; remainder of exam normal. No explanation for patient's abdominal pain with today's examination  . ESOPHAGOGASTRODUODENOSCOPY N/A 12/17/2013   Dr. [name]: gastric erythema, erosion, mild chronic inflammation on path   . LAPAROSCOPIC APPENDECTOMY  09/29/2011   Procedure: APPENDECTOMY LAPAROSCOPIC;  Surgeon: Jamesetta So, MD;  Location: [facility];  Service: General;;  incidental appendectomy  . PARTIAL MASTECTOMY WITH NEEDLE LOCALIZATION AND AXILLARY SENTINEL LYMPH NODE BX Right 09/18/2018   Procedure: RIGHT PARTIAL MASTECTOMY AFTER NEEDLE LOCALIZATION, SENTINEL LYMPH NODE BIOPSY RIGHT AXILLA;  Surgeon: Aviva Signs, MD;  Location: [facility];  Service: General;  Laterality: Right;   Current Outpatient Medications on File Prior to Visit  Medication Sig Dispense Refill  . amLODipine (NORVASC) 5 MG tablet Take 1 tablet (5 mg total) by mouth daily. (Patient taking differently: Take 5 mg by mouth daily as needed (blood pressure 159 or above). ) 90 tablet 3  . anastrozole (ARIMIDEX) 1 MG tablet Take 1 tablet (1 mg total) by mouth daily. 30 tablet 4  . aspirin EC 81 MG tablet Take 81 mg by mouth daily.    Marland Kitchen atorvastatin (LIPITOR) 20 MG tablet Take 1 tablet (20 mg total) by  mouth daily. 90 tablet 1  . B Complex-C-Folic Acid (DIALYVITE 017) 0.8 MG TABS Take 800 mg by mouth daily.     . butalbital-acetaminophen-caffeine (FIORICET, ESGIC) 50-325-40 MG tablet Take 1 tablet by mouth every 6 (six) hours as needed for headache. 20 tablet 0  . Calcium Carb-Cholecalciferol (CALCIUM 1000 + D PO) Take by mouth.    . cinacalcet (SENSIPAR) 30 MG tablet Take 2 tablets (60 mg total) by mouth daily. 90 tablet 0  . DULoxetine (CYMBALTA) 30 MG capsule Take 1 capsule (30 mg total) by mouth daily. 30 capsule 3  . gabapentin (NEURONTIN) 300 MG capsule TAKE 1 CAPSULE(300 MG) BY MOUTH AT BEDTIME 30 capsule 3  . HYDROcodone-acetaminophen (NORCO) 5-325 MG tablet Take 1 tablet by mouth every 4 (four) hours as needed for moderate pain. 30 tablet 0  . lanthanum (FOSRENOL) 1000 MG chewable tablet Chew 1,000-2,000 mg by mouth See admin instructions. Take 2000 mg by mouth twice daily with meals and 1000 with a snack    . lidocaine-prilocaine (EMLA) cream APPLY SMALL AMOUNT TO ACCESS SITE (AVF) 1 TO 2 HOURS BEFORE DIALYSIS. COVER WITH OCCLUSIVE DRESSING (SARAN WRAP)    . losartan (COZAAR) 50 MG tablet Take 1 tablet (50 mg total) by mouth daily. 90 tablet 3  . nebivolol (BYSTOLIC) 10 MG tablet Take 1 tablet (10 mg total) by mouth 2 (two) times daily. (Patient taking differently: Take 10 mg by mouth 3 (three) times a week. Take on the days of dialysis Tuesdays, Thursdays, and Saturdays) 180 tablet 3  . ondansetron (ZOFRAN) 4 MG tablet Take 1 tablet (4 mg total) by mouth every 8 (eight) hours as needed for nausea or vomiting. 20 tablet 0  . pantoprazole (PROTONIX) 40 MG tablet TAKE 1 TABLET(40 MG) BY MOUTH DAILY 90 tablet 3  . prochlorperazine (COMPAZINE) 10 MG tablet Take 1 tablet (10 mg total) by mouth every 6 (six) hours as needed (Nausea or vomiting). 30 tablet 1  . tiZANidine (ZANAFLEX) 4 MG capsule TAKE 1 CAPSULE(4 MG) BY MOUTH TWICE DAILY WITH A MEAL. START 1 CAPSULE AT NIGHT FOR 2 WEEKS AND THEN  TWICE DAILY 60 capsule 0   No current facility-administered medications on file prior to visit.    No Known Allergies Social History   Socioeconomic History  . Marital status: Married    Spouse name: Not on file  . Number of children: Not on file  . Years of education: Not on file  . Highest education level: Not on file  Occupational History  . Occupation: Merchandiser, retail: [employer] # [phone]  Social Needs  . Financial resource strain: Not hard at all  . Food insecurity    Worry: Never true    Inability: Never true  . Transportation needs    Medical: No    Non-medical: No  Tobacco Use  . Smoking status: Never Smoker  . Smokeless tobacco: Never Used  Substance and Sexual Activity  . Alcohol  use: No  . Drug use: No  . Sexual activity: Yes    Birth control/protection: Surgical  Lifestyle  . Physical activity    Days per week: 0 days    Minutes per session: 0 min  . Stress: Not at all  Relationships  . Social Herbalist on phone: Twice a week    Gets together: Twice a week    Attends religious service: More than 4 times per year    Active member of club or organization: No    Attends meetings of clubs or organizations: Never    Relationship status: Married  . Intimate partner violence    Fear of current or ex partner: No    Emotionally abused: No    Physically abused: No    Forced sexual activity: No  Other Topics Concern  . Not on file  Social History Narrative   Works at [employer] in [place].    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   Also has to get items from high shelves-causes achy pain in shoulder area      Married.   Children are grown, out of house.      Review of Systems  All other systems reviewed and are negative.      Objective:   Physical Exam Vitals signs reviewed.  Constitutional:      General: She is not in acute distress.    Appearance: Normal appearance. She is normal weight.  Cardiovascular:      Rate and Rhythm: Normal rate and regular rhythm.     Pulses: Normal pulses.     Heart sounds: No murmur (thrill from AV fistual heard as murmur).  Pulmonary:     Effort: Pulmonary effort is normal. No respiratory distress.     Breath sounds: Normal breath sounds. No stridor. No wheezing, rhonchi or rales.  Chest:     Chest wall: No tenderness.  Musculoskeletal:     Right lower leg: No edema.     Left lower leg: No edema.  Neurological:     Mental Status: She is alert.           Assessment & Plan:  1. Other iron deficiency anemia Most likely her anemia secondary to chronic kidney disease and lack of erythropoietin.  I have recommended that the patient schedule a follow-up appointment with her nephrologist to discuss continuing Aranesp injections in the future every 2 weeks or so to achieve target hemoglobin levels near 10 to improve her dyspnea and help her energy. - Fecal Globin By Immunochemistry  2. ESRD (end stage renal disease) on dialysis [facility]) Patient is already on iron infusions per her report at dialysis  3. Anemia in stage 5 chronic kidney disease, not on chronic dialysis ([place]) We will also check stool for blood to rule out GI bleed is a contributing factor on her anemia

## 2019-02-08 DIAGNOSIS — E1129 Type 2 diabetes mellitus with other diabetic kidney complication: Secondary | ICD-10-CM | POA: Diagnosis not present

## 2019-02-08 DIAGNOSIS — N186 End stage renal disease: Secondary | ICD-10-CM | POA: Diagnosis not present

## 2019-02-08 DIAGNOSIS — D631 Anemia in chronic kidney disease: Secondary | ICD-10-CM | POA: Diagnosis not present

## 2019-02-08 DIAGNOSIS — E119 Type 2 diabetes mellitus without complications: Secondary | ICD-10-CM | POA: Diagnosis not present

## 2019-02-08 DIAGNOSIS — N2581 Secondary hyperparathyroidism of renal origin: Secondary | ICD-10-CM | POA: Diagnosis not present

## 2019-02-08 DIAGNOSIS — D509 Iron deficiency anemia, unspecified: Secondary | ICD-10-CM | POA: Diagnosis not present

## 2019-02-09 ENCOUNTER — Other Ambulatory Visit: Payer: Self-pay | Admitting: Family Medicine

## 2019-02-10 ENCOUNTER — Other Ambulatory Visit: Payer: Medicare Other

## 2019-02-10 DIAGNOSIS — D508 Other iron deficiency anemias: Secondary | ICD-10-CM | POA: Diagnosis not present

## 2019-02-11 DIAGNOSIS — N2581 Secondary hyperparathyroidism of renal origin: Secondary | ICD-10-CM | POA: Diagnosis not present

## 2019-02-11 DIAGNOSIS — N186 End stage renal disease: Secondary | ICD-10-CM | POA: Diagnosis not present

## 2019-02-11 DIAGNOSIS — D631 Anemia in chronic kidney disease: Secondary | ICD-10-CM | POA: Diagnosis not present

## 2019-02-11 DIAGNOSIS — E119 Type 2 diabetes mellitus without complications: Secondary | ICD-10-CM | POA: Diagnosis not present

## 2019-02-11 DIAGNOSIS — E1129 Type 2 diabetes mellitus with other diabetic kidney complication: Secondary | ICD-10-CM | POA: Diagnosis not present

## 2019-02-11 DIAGNOSIS — D509 Iron deficiency anemia, unspecified: Secondary | ICD-10-CM | POA: Diagnosis not present

## 2019-02-11 LAB — FECAL GLOBIN BY IMMUNOCHEMISTRY
FECAL GLOBIN RESULT:: NOT DETECTED
MICRO NUMBER:: 616882
SPECIMEN QUALITY:: ADEQUATE

## 2019-02-12 DIAGNOSIS — Z992 Dependence on renal dialysis: Secondary | ICD-10-CM | POA: Diagnosis not present

## 2019-02-12 DIAGNOSIS — N186 End stage renal disease: Secondary | ICD-10-CM | POA: Diagnosis not present

## 2019-02-12 DIAGNOSIS — N040 Nephrotic syndrome with minor glomerular abnormality: Secondary | ICD-10-CM | POA: Diagnosis not present

## 2019-02-13 DIAGNOSIS — N2581 Secondary hyperparathyroidism of renal origin: Secondary | ICD-10-CM | POA: Diagnosis not present

## 2019-02-13 DIAGNOSIS — D631 Anemia in chronic kidney disease: Secondary | ICD-10-CM | POA: Diagnosis not present

## 2019-02-13 DIAGNOSIS — E119 Type 2 diabetes mellitus without complications: Secondary | ICD-10-CM | POA: Diagnosis not present

## 2019-02-13 DIAGNOSIS — N186 End stage renal disease: Secondary | ICD-10-CM | POA: Diagnosis not present

## 2019-02-13 DIAGNOSIS — E1129 Type 2 diabetes mellitus with other diabetic kidney complication: Secondary | ICD-10-CM | POA: Diagnosis not present

## 2019-02-14 ENCOUNTER — Emergency Department (HOSPITAL_COMMUNITY): Payer: Medicare Other

## 2019-02-14 ENCOUNTER — Encounter (HOSPITAL_COMMUNITY): Payer: Self-pay

## 2019-02-14 ENCOUNTER — Emergency Department (HOSPITAL_COMMUNITY)
Admission: EM | Admit: 2019-02-14 | Discharge: 2019-02-14 | Disposition: A | Discharge status: Home / Self Care | Payer: Medicare Other | Attending: Emergency Medicine | Admitting: Emergency Medicine

## 2019-02-14 ENCOUNTER — Other Ambulatory Visit: Payer: Self-pay | Admitting: Family Medicine

## 2019-02-14 ENCOUNTER — Other Ambulatory Visit: Payer: Self-pay

## 2019-02-14 DIAGNOSIS — N186 End stage renal disease: Secondary | ICD-10-CM | POA: Insufficient documentation

## 2019-02-14 DIAGNOSIS — Z992 Dependence on renal dialysis: Secondary | ICD-10-CM | POA: Diagnosis not present

## 2019-02-14 DIAGNOSIS — Z20828 Contact with and (suspected) exposure to other viral communicable diseases: Secondary | ICD-10-CM | POA: Diagnosis not present

## 2019-02-14 DIAGNOSIS — E1122 Type 2 diabetes mellitus with diabetic chronic kidney disease: Secondary | ICD-10-CM | POA: Insufficient documentation

## 2019-02-14 DIAGNOSIS — R0789 Other chest pain: Secondary | ICD-10-CM | POA: Diagnosis not present

## 2019-02-14 DIAGNOSIS — I5032 Chronic diastolic (congestive) heart failure: Secondary | ICD-10-CM | POA: Diagnosis not present

## 2019-02-14 DIAGNOSIS — Z853 Personal history of malignant neoplasm of breast: Secondary | ICD-10-CM | POA: Diagnosis not present

## 2019-02-14 DIAGNOSIS — R079 Chest pain, unspecified: Secondary | ICD-10-CM | POA: Diagnosis not present

## 2019-02-14 DIAGNOSIS — R0602 Shortness of breath: Secondary | ICD-10-CM | POA: Diagnosis not present

## 2019-02-14 DIAGNOSIS — Z79899 Other long term (current) drug therapy: Secondary | ICD-10-CM | POA: Insufficient documentation

## 2019-02-14 DIAGNOSIS — I132 Hypertensive heart and chronic kidney disease with heart failure and with stage 5 chronic kidney disease, or end stage renal disease: Secondary | ICD-10-CM | POA: Diagnosis not present

## 2019-02-14 DIAGNOSIS — J9811 Atelectasis: Secondary | ICD-10-CM | POA: Diagnosis not present

## 2019-02-14 DIAGNOSIS — I517 Cardiomegaly: Secondary | ICD-10-CM | POA: Diagnosis not present

## 2019-02-14 DIAGNOSIS — Z7982 Long term (current) use of aspirin: Secondary | ICD-10-CM | POA: Diagnosis not present

## 2019-02-14 DIAGNOSIS — I270 Primary pulmonary hypertension: Secondary | ICD-10-CM | POA: Diagnosis not present

## 2019-02-14 LAB — CBC WITH DIFFERENTIAL/PLATELET
Abs Immature Granulocytes: 0.06 10*3/uL (ref 0.00–0.07)
Basophils Absolute: 0.1 10*3/uL (ref 0.0–0.1)
Basophils Relative: 0 %
Eosinophils Absolute: 0.2 10*3/uL (ref 0.0–0.5)
Eosinophils Relative: 2 %
HCT: 27.3 % — ABNORMAL LOW (ref 36.0–46.0)
Hemoglobin: 8.5 g/dL — ABNORMAL LOW (ref 12.0–15.0)
Immature Granulocytes: 1 %
Lymphocytes Relative: 11 %
Lymphs Abs: 1.4 10*3/uL (ref 0.7–4.0)
MCH: 32.9 pg (ref 26.0–34.0)
MCHC: 31.1 g/dL (ref 30.0–36.0)
MCV: 105.8 fL — ABNORMAL HIGH (ref 80.0–100.0)
Monocytes Absolute: 1.6 10*3/uL — ABNORMAL HIGH (ref 0.1–1.0)
Monocytes Relative: 13 %
Neutro Abs: 9.6 10*3/uL — ABNORMAL HIGH (ref 1.7–7.7)
Neutrophils Relative %: 73 %
Platelets: 224 10*3/uL (ref 150–400)
RBC: 2.58 MIL/uL — ABNORMAL LOW (ref 3.87–5.11)
RDW: 18.9 % — ABNORMAL HIGH (ref 11.5–15.5)
WBC: 13 10*3/uL — ABNORMAL HIGH (ref 4.0–10.5)
nRBC: 0 % (ref 0.0–0.2)

## 2019-02-14 LAB — BASIC METABOLIC PANEL
Anion gap: 10 (ref 5–15)
BUN: 29 mg/dL — ABNORMAL HIGH (ref 6–20)
CO2: 29 mmol/L (ref 22–32)
Calcium: 10.5 mg/dL — ABNORMAL HIGH (ref 8.9–10.3)
Chloride: 100 mmol/L (ref 98–111)
Creatinine, Ser: 6.01 mg/dL — ABNORMAL HIGH (ref 0.44–1.00)
GFR calc Af Amer: 8 mL/min — ABNORMAL LOW (ref >60)
GFR calc non Af Amer: 7 mL/min — ABNORMAL LOW (ref >60)
Glucose, Bld: 124 mg/dL — ABNORMAL HIGH (ref 70–99)
Potassium: 3.4 mmol/L — ABNORMAL LOW (ref 3.5–5.1)
Sodium: 139 mmol/L (ref 135–145)

## 2019-02-14 LAB — SARS CORONAVIRUS 2 BY RT PCR (HOSPITAL ORDER, PERFORMED IN ~~LOC~~ HOSPITAL LAB): SARS Coronavirus 2: NEGATIVE

## 2019-02-14 LAB — TROPONIN I (HIGH SENSITIVITY)
Troponin I (High Sensitivity): 25 ng/L — ABNORMAL HIGH (ref <18)
Troponin I (High Sensitivity): 23 ng/L — ABNORMAL HIGH (ref <18)

## 2019-02-14 LAB — D-DIMER, QUANTITATIVE: D-Dimer, Quant: 1 ug/mL-FEU — ABNORMAL HIGH (ref 0.00–0.50)

## 2019-02-14 LAB — BRAIN NATRIURETIC PEPTIDE: B Natriuretic Peptide: >4500 pg/mL — ABNORMAL HIGH (ref 0.0–100.0)

## 2019-02-14 MED ORDER — HYDROCODONE-ACETAMINOPHEN 5-325 MG PO TABS: 1.0000 | ORAL_TABLET | Freq: Four times a day (QID) | ORAL | 0 refills | Status: DC | PRN

## 2019-02-14 MED ORDER — IOHEXOL 350 MG/ML SOLN
100.0000 mL | Freq: Once | INTRAVENOUS | Status: AC | PRN
  Administered 2019-02-14: 100 mL via INTRAVENOUS

## 2019-02-14 NOTE — ED Triage Notes (Signed)
Pt started feeling sob on Tuesday. It gets better after dialysis she said but then it came back. Next apt is saturday. Also complains of right arm pain.

## 2019-02-14 NOTE — ED Provider Notes (Signed)
CT angios of chest showed no evidence of pulmonary embolism no infiltrates no pulmonary edema.  Patient does have a markedly high BNP.  And had initial elevated troponin greater than 18.  Repeat troponin though was lower initial was 20 5 repeat was 23.   Patient with persistent right-sided chest pain that seems to be sort of chest wall or pleuritic in nature.  And that worse with taking deep breaths.  Patient's oxygen sats are fine again CT showed no PE or any explanation for the chest pain.  Due to the elevated troponins which I felt were probably secondary to patient's renal disease however they were greater then 18.  So discussed with Dr. Caryl Comes for cardiology.  He feels since the second 1 was lower and the fact the patient's had pain for 3 days patient did qualify for discharge home.  Felt strongly the pain was not cardiac in nature.  Patient will be discharged home with some hydrocodone for the chest pain.  We will continue dialysis tomorrow and follow back up with primary care doctor.  Patient's oxygen saturations on room air 98 to 100%.   Fredia Sorrow, MD 02/14/19 1014

## 2019-02-14 NOTE — ED Provider Notes (Signed)
[facility] EMERGENCY DEPARTMENT Provider Note   CSN: [id number] Arrival date & time: 02/14/19  7408    History   Chief Complaint Chief Complaint  Patient presents with   Shortness of Breath    HPI Megan [name] is a 58 y.o. female.  HPI: A 58 year old patient with a history of treated diabetes, hypertension, hypercholesterolemia and obesity presents for evaluation of chest pain. Initial onset of pain was approximately 3-6 hours ago. The patient's chest pain is sharp and is not worse with exertion. The patient's chest pain is not middle- or left-sided, is not well-localized, is not described as heaviness/pressure/tightness and does radiate to the arms/jaw/neck. The patient does not complain of nausea and denies diaphoresis. The patient has no history of stroke, has no history of peripheral artery disease, has not smoked in the past 90 days and has no relevant family history of coronary artery disease (first degree relative at less than age 46).   Patient presents to the emergency department for evaluation of right-sided chest pain and shortness of breath.  Patient reports that she has been experiencing this pain for a couple of days.  It has been waxing and waning.  Tonight the pain has become severe.  She reports a sharp and stabbing pain in the right chest whenever she takes a deep breath.  She also has been experiencing a fairly significant amount of coughing.  No fever.  She has now noticed prior to coming to the ER that the pain is starting to radiate into the right arm.     Past Medical History:  Diagnosis Date   Anemia    Blood transfusion without reported diagnosis    Cataract    CKD (chronic kidney disease) stage 3, GFR 30-59 ml/min (HCC)    ESRD (end stage renal disease) on dialysis ([place])    on HD T, T, S   Essential hypertension, benign    GERD (gastroesophageal reflux disease)    Headache    Hemorrhoids    Mixed hyperlipidemia    PONV (postoperative  nausea and vomiting)    Renal insufficiency    S/P colonoscopy Jan 2011   Dr. 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[place])    Rectal bleeding 06/08/2016   Vaginal discharge 12/30/2015   Abdominal pain, epigastric 11/26/2013   GERD (gastroesophageal reflux disease)    Type 2 diabetes mellitus (HCC)    CKD (chronic kidney disease) stage 3, GFR 30-59 ml/min (HCC)    External hemorrhoid 08/01/2011   Hematochezia 07/13/2011   Dyspnea 04/21/2010   Atypical chest pain 04/21/2010   Mixed hyperlipidemia 03/11/2008   OBESITY 03/11/2008   Essential hypertension, benign 03/11/2008    Past Surgical History:  Procedure Laterality Date   ABDOMINAL HYSTERECTOMY     APPENDECTOMY     ARTERY BIOPSY N/A 05/09/2018   Procedure: RIGHT TEMPORAL ARTERY BIOPSY;  Surgeon: Judeth Horn, MD;  Location: [place];  Service: General;  Laterality: N/A;   CATARACT EXTRACTION W/PHACO Left 02/09/2017   Procedure: CATARACT  EXTRACTION PHACO AND INTRAOCULAR LENS PLACEMENT LEFT EYE;  Surgeon: Tonny Branch, MD;  Location: [facility];  Service: Ophthalmology;  Laterality: Left;  CDE: 4.89   CATARACT EXTRACTION W/PHACO Right 06/04/2017   Procedure: CATARACT EXTRACTION PHACO AND INTRAOCULAR LENS PLACEMENT (IOC);  Surgeon: Tonny Branch, MD;  Location: [facility];  Service: Ophthalmology;  Laterality: Right;  CDE: 4.12   CHOLECYSTECTOMY  09/29/2011   Procedure: LAPAROSCOPIC CHOLECYSTECTOMY;  Surgeon: Jamesetta So, MD;  Location: [facility];   Service: General;  Laterality: N/A;   COLONOSCOPY  Jan 2011   Dr. Benson Norway: sessile polyp (benign lymphoid), large hemorrhoids, repeat 5-10 years   COLONOSCOPY N/A 06/12/2016   Procedure: COLONOSCOPY;  Surgeon: Daneil Dolin, MD;  Location[facility];  Service: Endoscopy;  Laterality: N/A;  1230    ESOPHAGOGASTRODUODENOSCOPY  09/05/2011   WRU:EAVWU hiatal hernia; remainder of exam normal. No explanation for patient's abdominal pain with today's examination   ESOPHAGOGASTRODUODENOSCOPY N/A 12/17/2013   Dr. [name]: gastric erythema, erosion, mild chronic inflammation on path    LAPAROSCOPIC APPENDECTOMY  09/29/2011   Procedure: APPENDECTOMY LAPAROSCOPIC;  Surgeon: Jamesetta So, MD;  Location: [facility];  Service: General;;  incidental appendectomy   PARTIAL MASTECTOMY WITH NEEDLE LOCALIZATION AND AXILLARY SENTINEL LYMPH NODE BX Right 09/18/2018   Procedure: RIGHT PARTIAL MASTECTOMY AFTER NEEDLE LOCALIZATION, SENTINEL LYMPH NODE BIOPSY RIGHT AXILLA;  Surgeon: Aviva Signs, MD;  Location: [facility];  Service: General;  Laterality: Right;     OB History   No obstetric history on file.      Home Medications    Prior to Admission medications   Medication Sig Start Date End Date Taking? Authorizing Provider  amLODipine (NORVASC) 5 MG tablet Take 1 tablet (5 mg total) by mouth daily. Patient taking differently: Take 5 mg by mouth daily as needed (blood pressure 159 or above).  08/19/18   [name], MD  anastrozole (ARIMIDEX) 1 MG tablet Take 1 tablet (1 mg total) by mouth daily. 01/29/19   Derek Jack, MD  aspirin EC 81 MG tablet Take 81 mg by mouth daily.    [provider]  atorvastatin (LIPITOR) 20 MG tablet Take 1 tablet (20 mg total) by mouth daily. 08/16/18   [name], MD  B Complex-C-Folic Acid (DIALYVITE 981) 0.8 MG TABS Take 800 mg by mouth daily.  01/22/18   [provider]  butalbital-acetaminophen-caffeine (FIORICET, ESGIC) 50-325-40 MG tablet  Take 1 tablet by mouth every 6 (six) hours as needed for headache. 06/04/18 06/04/19  [name], MD  Calcium Carb-Cholecalciferol (CALCIUM 1000 + D PO) Take by mouth.    [provider]  cinacalcet (SENSIPAR) 30 MG tablet Take 2 tablets (60 mg total) by mouth daily. 06/13/18   [name], MD  DULoxetine (CYMBALTA) 30 MG capsule TAKE 1 CAPSULE(30 MG) BY MOUTH DAILY 02/10/19   [name], MD  gabapentin (NEURONTIN) 300 MG capsule TAKE 1 CAPSULE(300 MG) BY MOUTH AT BEDTIME 09/16/18   [name], MD  HYDROcodone-acetaminophen (NORCO) 5-325 MG tablet Take 1 tablet by mouth every 4 (four) hours as needed for moderate pain. 09/18/18   Aviva Signs, MD  lanthanum (FOSRENOL) 1000 MG chewable tablet Chew 1,000-2,000 mg by mouth See admin instructions. Take 2000 mg by mouth twice daily with meals and 1000 with a snack 07/01/18   [provider]  lidocaine-prilocaine (EMLA) cream APPLY SMALL AMOUNT TO ACCESS SITE (AVF) 1 TO 2 HOURS BEFORE DIALYSIS. COVER WITH OCCLUSIVE DRESSING (  SARAN WRAP) 09/24/18   [provider]  losartan (COZAAR) 50 MG tablet Take 1 tablet (50 mg total) by mouth daily. 03/28/18   [name], MD  nebivolol (BYSTOLIC) 10 MG tablet Take 1 tablet (10 mg total) by mouth 2 (two) times daily. Patient taking differently: Take 10 mg by mouth 3 (three) times a week. Take on the days of dialysis Tuesdays, Thursdays, and Saturdays 06/13/18   [name], MD  ondansetron (ZOFRAN) 4 MG tablet Take 1 tablet (4 mg total) by mouth every 8 (eight) hours as needed for nausea or vomiting. 09/18/18   Aviva Signs, MD  pantoprazole (PROTONIX) 40 MG tablet TAKE 1 TABLET(40 MG) BY MOUTH DAILY 08/19/18   [name], MD  prochlorperazine (COMPAZINE) 10 MG tablet Take 1 tablet (10 mg total) by mouth every 6 (six) hours as needed (Nausea or vomiting). 10/29/18   Derek Jack, MD  tiZANidine (ZANAFLEX) 4 MG capsule TAKE 1 CAPSULE(4 MG) BY MOUTH  TWICE DAILY WITH A MEAL. START 1 CAPSULE AT NIGHT FOR 2 WEEKS AND THEN TWICE DAILY 01/14/19   Garvin Fila, MD    Family History Family History  Problem Relation Age of Onset   Hypertension Mother    Coronary artery disease Mother    Diabetes Mother    Hypertension Sister    Coronary artery disease Sister    Hypertension Brother    Heart attack Father    Hypertension Son    Heart attack Maternal Aunt    Hypertension Maternal Aunt    Diabetes Maternal Aunt    Heart attack Maternal Uncle    Hypertension Maternal Uncle    Diabetes Maternal Uncle    Heart attack Paternal Aunt    Hypertension Paternal Aunt    Diabetes Paternal Aunt    Heart attack Paternal Uncle    Hypertension Paternal Uncle    Diabetes Paternal Uncle    Heart attack Maternal Grandmother    Heart attack Maternal Grandfather    Heart attack Paternal Grandmother    Heart attack Paternal Grandfather    Colon cancer Neg Hx     Social History Social History   Tobacco Use   Smoking status: Never Smoker   Smokeless tobacco: Never Used  Substance Use Topics   Alcohol use: No   Drug use: No     Allergies   Patient has no known allergies.   Review of Systems Review of Systems  Respiratory: Positive for cough and shortness of breath.   Cardiovascular: Positive for chest pain.  All other systems reviewed and are negative.    Physical Exam Updated Vital Signs BP (!) 147/82    Pulse 87    Temp 98.7 F (37.1 C) (Oral)    Resp (!) 23    Ht 5\' 2"  (1.575 m)    Wt 74.4 kg    SpO2 98%    BMI 30.00 kg/m   Physical Exam Vitals signs and nursing note reviewed.  Constitutional:      General: She is not in acute distress.    Appearance: Normal appearance. She is well-developed.  HENT:     Head: Normocephalic and atraumatic.     Right Ear: Hearing normal.     Left Ear: Hearing normal.     Nose: Nose normal.  Eyes:     Conjunctiva/sclera: Conjunctivae normal.     Pupils:  Pupils are equal, round, and reactive to light.  Neck:     Musculoskeletal: Normal range of motion  and neck supple.  Cardiovascular:     Rate and Rhythm: Regular rhythm.     Heart sounds: S1 normal and S2 normal. No murmur. No friction rub. No gallop.   Pulmonary:     Effort: Pulmonary effort is normal. No respiratory distress.     Breath sounds: Normal breath sounds.  Chest:     Chest wall: Tenderness present.    Abdominal:     General: Bowel sounds are normal.     Palpations: Abdomen is soft.     Tenderness: There is no abdominal tenderness. There is no guarding or rebound. Negative signs include Murphy's sign and McBurney's sign.     Hernia: No hernia is present.  Musculoskeletal: Normal range of motion.  Skin:    General: Skin is warm and dry.     Findings: No rash.  Neurological:     Mental Status: She is alert and oriented to person, place, and time.     GCS: GCS eye subscore is 4. GCS verbal subscore is 5. GCS motor subscore is 6.     Cranial Nerves: No cranial nerve deficit.     Sensory: No sensory deficit.     Coordination: Coordination normal.  Psychiatric:        Speech: Speech normal.        Behavior: Behavior normal.        Thought Content: Thought content normal.      ED Treatments / Results  Labs (all labs ordered are listed, but only abnormal results are displayed) Labs Reviewed  CBC WITH DIFFERENTIAL/PLATELET - Abnormal; Notable for the following components:      Result Value   WBC 13.0 (*)    RBC 2.58 (*)    Hemoglobin 8.5 (*)    HCT 27.3 (*)    MCV 105.8 (*)    RDW 18.9 (*)    Neutro Abs 9.6 (*)    Monocytes Absolute 1.6 (*)    All other components within normal limits  BASIC METABOLIC PANEL - Abnormal; Notable for the following components:   Potassium 3.4 (*)    Glucose, Bld 124 (*)    BUN 29 (*)    Creatinine, Ser 6.01 (*)    Calcium 10.5 (*)    GFR calc non Af Amer 7 (*)    GFR calc Af Amer 8 (*)    All other components within normal  limits  BRAIN NATRIURETIC PEPTIDE - Abnormal; Notable for the following components:   B Natriuretic Peptide >4,500.0 (*)    All other components within normal limits  TROPONIN I (HIGH SENSITIVITY) - Abnormal; Notable for the following components:   Troponin I (High Sensitivity) 25.00 (*)    All other components within normal limits  D-DIMER, QUANTITATIVE (NOT AT [facility]) - Abnormal; Notable for the following components:   D-Dimer, Quant 1.00 (*)    All other components within normal limits  SARS CORONAVIRUS 2 (HOSPITAL ORDER, [place] [facility])  TROPONIN I (HIGH SENSITIVITY)    EKG EKG Interpretation  Date/Time:  Friday February 14 2019 05:28:18 EDT Ventricular Rate:  88 PR Interval:    QRS Duration: 89 QT Interval:  401 QTC Calculation: 486 R Axis:   23 Text Interpretation:  Sinus rhythm Borderline T abnormalities, anterior leads Borderline prolonged QT interval Confirmed by Orpah Greek [phone]) on 02/14/2019 5:35:22 AM   Radiology Dg Chest 2 View  Result Date: 02/14/2019 CLINICAL DATA:  58 y/o F; 3 days of shortness of breath. Right-sided  chest pain and right arm pain. EXAM: CHEST - 2 VIEW COMPARISON:  02/03/2019 chest radiograph FINDINGS: Stable enlarged cardiac silhouette given projection and technique. Aortic calcific atherosclerosis. Linear opacities at the left lung base. Pulmonary venous hypertension. Surgical clips project over the right axilla. No pleural effusion or pneumothorax. No acute osseous abnormality is evident. There is retained contrast within the colon. IMPRESSION: Stable cardiomegaly. Pulmonary venous hypertension. Left basilar platelike atelectasis. Electronically Signed   By: Kristine Garbe M.D.   On: 02/14/2019 06:06    Procedures Procedures (including critical care time)  Medications Ordered in ED Medications - No data to display   Initial Impression / Assessment and Plan / ED Course  I have reviewed the triage vital  signs and the nursing notes.  Pertinent labs & imaging results that were available during my care of the patient were reviewed by me and considered in my medical decision making (see chart for details).     HEAR Score: 4  Presents to the emergency department for evaluation of chest pain and shortness of breath.  Symptoms have been present intermittently for several days, returned and have been persistent tonight.  Pain is sharp in nature and there is some reproducibility on exam.  She reports that she has had cough and congestion.  Pain is significantly worsened by taking deep breaths.  This seems to suggest a pulmonary cause of her symptoms.  With cough and upper respiratory infection symptoms, pleurisy and bronchospasm are considered.  She does, however, have a history of cancer.  PE is therefore very possible.  She was admitted to the hospital 2 weeks ago with shortness of breath and had a PE study that was negative.  Therefore d-dimer was performed, but this is not terribly helpful because of her dialysis.  It is elevated again today.  She appears to have chronically elevated troponins in the past.  Therefore will undergo CT scan.  Troponin is also elevated.  She appears to have had a chronically elevated troponin secondary to her renal failure.  Her hear score is 4.  With her mildly elevated troponin, a second troponin is recommended.  I have low suspicion for cardiac etiology of her pain but must further work-up.  Will sign out to oncoming ER physician to follow-up on CT scan, second troponin and determine disposition.  Final Clinical Impressions(s) / ED Diagnoses   Final diagnoses:  Chest pain, unspecified type    ED Discharge Orders    None       Orpah Greek, MD 02/14/19 [phone]

## 2019-02-14 NOTE — Discharge Instructions (Signed)
Take the hydrocodone as needed for the chest wall pain.  CT scan of your chest showed no blood clots.  Showed no fluid.  However did pick up a pulmonary nodule but your primary care doctor will need to follow-up on.  Normally they recommend CT scan repeat in 6 months.  Continue dialysis.  Return for any new or worse symptoms.

## 2019-02-15 DIAGNOSIS — D631 Anemia in chronic kidney disease: Secondary | ICD-10-CM | POA: Diagnosis not present

## 2019-02-15 DIAGNOSIS — N2581 Secondary hyperparathyroidism of renal origin: Secondary | ICD-10-CM | POA: Diagnosis not present

## 2019-02-15 DIAGNOSIS — N186 End stage renal disease: Secondary | ICD-10-CM | POA: Diagnosis not present

## 2019-02-15 DIAGNOSIS — E1129 Type 2 diabetes mellitus with other diabetic kidney complication: Secondary | ICD-10-CM | POA: Diagnosis not present

## 2019-02-15 DIAGNOSIS — E119 Type 2 diabetes mellitus without complications: Secondary | ICD-10-CM | POA: Diagnosis not present

## 2019-02-18 DIAGNOSIS — N2581 Secondary hyperparathyroidism of renal origin: Secondary | ICD-10-CM | POA: Diagnosis not present

## 2019-02-18 DIAGNOSIS — E119 Type 2 diabetes mellitus without complications: Secondary | ICD-10-CM | POA: Diagnosis not present

## 2019-02-18 DIAGNOSIS — D631 Anemia in chronic kidney disease: Secondary | ICD-10-CM | POA: Diagnosis not present

## 2019-02-18 DIAGNOSIS — N186 End stage renal disease: Secondary | ICD-10-CM | POA: Diagnosis not present

## 2019-02-18 DIAGNOSIS — E1129 Type 2 diabetes mellitus with other diabetic kidney complication: Secondary | ICD-10-CM | POA: Diagnosis not present

## 2019-02-19 ENCOUNTER — Other Ambulatory Visit: Payer: Self-pay

## 2019-02-19 ENCOUNTER — Ambulatory Visit (INDEPENDENT_AMBULATORY_CARE_PROVIDER_SITE_OTHER): Payer: Medicare Other | Admitting: Family Medicine

## 2019-02-19 ENCOUNTER — Encounter: Payer: Self-pay | Admitting: Family Medicine

## 2019-02-19 VITALS — BP 152/90 | HR 86 | Temp 98.7°F | Resp 16 | Ht 62.0 in

## 2019-02-19 DIAGNOSIS — R079 Chest pain, unspecified: Secondary | ICD-10-CM

## 2019-02-19 DIAGNOSIS — R091 Pleurisy: Secondary | ICD-10-CM | POA: Diagnosis not present

## 2019-02-19 DIAGNOSIS — Z09 Encounter for follow-up examination after completed treatment for conditions other than malignant neoplasm: Secondary | ICD-10-CM | POA: Diagnosis not present

## 2019-02-19 MED ORDER — OXYCODONE-ACETAMINOPHEN 5-325 MG PO TABS: 1.0000 | ORAL_TABLET | Freq: Three times a day (TID) | ORAL | 0 refills | Status: AC | PRN

## 2019-02-19 MED ORDER — PREDNISONE 10 MG PO TABS: ORAL_TABLET | ORAL | 0 refills | Status: DC

## 2019-02-19 NOTE — Progress Notes (Signed)
Patient ID: LATIQUA DALOIA, female    DOB: 03-10-1961, 58 y.o.   MRN: [id number]  PCP: [name], MD  Chief Complaint  Patient presents with  . Chest pain, SOB - Pain 8    Subjective:   SHAUNESSY DOBRATZ is a 58 y.o. female, presents to clinic with CC of CP, recently evaluated in the ER for the same, has follow up tomorrow with PCP but pain was so bad today that she came in today to be seen for CP and refused to go to the ER.  Patient has complex medical history including but not limited to end-stage renal disease doing dialysis Tuesday Thursday and Saturday, CHF.  In reviewing her recent ER visit from 02/14/2019, 5 days ago, she did have elevated troponin and cardiology was consulted and decreasing troponin with pain for more than 3 days, cardiology advised that pain was likely not cardiac in nature and patient did qualify for discharge home and did not have to be admitted.  She had a negative CT Angio which showed no pulmonary embolism or infiltrates, nor pulmonary edema.  In reviewing the CT images and report and other recent CT from 02/03/2019, there is resolving bilateral symmetrical perihilar groundglass opacities, smooth septal thickening, bilateral lower lobe segment total atelectasis which improved, and unchanged small left pleural effusion and decreased right pleural effusion.  BNP was markedly elevated.  EKG from the ER was unremarkable.  That was repeated here today before I saw the patient there is no acute EKG changes, nonspecific T wave abnormalities are also not changed much from recent EKGs.  Patient had dialysis yesterday, she notes no difficulty breathing no abnormal vital signs during or shortly after dialysis.  She has had some CP, SOB mild coughing over a few weeks, but yesterday and today CP became much more severe and pain meds from the ER did not help pain at all.  Pain is central chest sharp and stabbing pleuritic in nature worse with movement, lying down and  with taking deep breath.  She denies any dyspnea with exertion or any change to pain quality or severity with exertion.  She denies orthopnea, PND, palpitations, lower extremity edema, near syncope.  Patient denies any change to the quality of her pain with eating or swallowing.  Denies any nausea vomiting diarrhea, GERD, reflux, dysphagia.  Patient further states that her shortness of breath is at her baseline she has had no change whatsoever.  No pain with palpation to chest wall.  Her weight has been stable, no significant lower extremity edema.  With further chart review and in discussion with patient and her husband she was admitted to the hospital for symptomatic anemia and elevated troponin only 2 to 3 weeks ago on 02/02/2019.  At that time she presented to the ER for shortness of breath following her dialysis she was admitted with acute hypoxemic respiratory failure suspected to be possibly related to volume overload.  She was also anemic and received blood transfusion.  During her admission nephrology treat her and she had repeat hemodialysis on the day of discharge also had ultrafiltration of almost 2 L on 622 and was discharged when she felt much better and dyspnea improved.  A CT done of her chest at that time showed some pulmonary nodules which would require 3 to 21-month follow-up.  Most recent nuclear stress test was June 11, 2017 which was a EF of 52%.  Echo done during admission was 02/03/2019  For CHF which  showed LVEF 55-60%, mild concentric left ventricular hypertrophy and elevated left ventricular end-diastolic pressure, mitral valve leaflet mild thickening and torn mitral valve chordae, right ventricle had normal systolic function and no change or increase to right ventricular wall thickening, mitral valve regurg was mild No pericardial effusion.   Patient did have negative COVID testing done in the ER and when recently hospitalized, she denies any fever chills sweats weight loss,  rash, nasal congestion, headache, GI symptoms.   Patient Active Problem List   Diagnosis Date Noted  . Elevated troponin 02/03/2019  . ESRD (end stage renal disease) ([place]) 02/03/2019  . Acute diastolic CHF (congestive heart failure) ([place]) 02/03/2019  . Hypokalemia 02/03/2019  . Acute respiratory failure with hypoxia ([place]) 02/03/2019  . 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[place])   . Rectal bleeding 06/08/2016  . Vaginal discharge 12/30/2015  . Abdominal pain, epigastric 11/26/2013  . GERD (gastroesophageal reflux disease)   . Type 2 diabetes mellitus ([place])   . CKD (chronic kidney disease) stage 3, GFR 30-59 ml/min ([facility])   . External hemorrhoid 08/01/2011  . Hematochezia 07/13/2011  . Dyspnea 04/21/2010  . Atypical chest pain 04/21/2010  . Mixed hyperlipidemia 03/11/2008  . OBESITY 03/11/2008  . Essential hypertension, benign 03/11/2008     Prior to Admission medications   Medication Sig Start Date End Date Taking? Authorizing Provider  amLODipine (NORVASC) 5 MG tablet Take 1 tablet (5 mg total) by mouth daily. Patient taking differently: Take 5 mg by mouth daily as needed (blood pressure 159 or above).  08/19/18  Yes [name], MD  anastrozole (ARIMIDEX) 1 MG tablet Take 1 tablet (1 mg total) by mouth daily. 01/29/19  Yes Derek Jack, MD  aspirin EC 81 MG tablet Take 81 mg by mouth daily.   Yes [provider]  atorvastatin (LIPITOR) 20 MG tablet Take 1 tablet (20 mg total) by mouth daily. 08/16/18  Yes [name], MD  B Complex-C-Folic Acid (DIALYVITE 397) 0.8 MG TABS Take 800 mg by mouth daily.  01/22/18  Yes [provider]  butalbital-acetaminophen-caffeine (FIORICET, ESGIC) 50-325-40 MG tablet Take 1 tablet by mouth every 6 (six) hours as needed for headache. 06/04/18 06/04/19 Yes [name], MD  Calcium Carb-Cholecalciferol (CALCIUM 1000 + D PO) Take 1 tablet by mouth daily.    Yes [provider]  cinacalcet (SENSIPAR) 30 MG tablet Take 2 tablets (60 mg total) by mouth daily. 06/13/18  Yes [name], MD  DULoxetine (CYMBALTA) 30 MG capsule TAKE 1 CAPSULE(30 MG) BY MOUTH DAILY 02/10/19  Yes [name], MD  gabapentin (NEURONTIN) 300 MG capsule TAKE 1 CAPSULE(300 MG) BY MOUTH AT BEDTIME 02/17/19  Yes [name], MD  HYDROcodone-acetaminophen (NORCO) 5-325 MG tablet Take 1 tablet by mouth every 4 (four) hours as needed for moderate pain. 09/18/18  Yes Aviva Signs, MD  HYDROcodone-acetaminophen (NORCO/VICODIN) 5-325 MG tablet Take 1 tablet by mouth every 6 (six) hours as needed for moderate pain. 02/14/19  Yes Fredia Sorrow, MD  lanthanum (FOSRENOL) 1000 MG chewable tablet Chew 1,000-2,000 mg by mouth See admin instructions. Take 2000 mg by mouth twice daily with meals and 1000 with a snack 07/01/18  Yes [provider]  lidocaine-prilocaine (EMLA) cream APPLY SMALL AMOUNT TO ACCESS SITE (AVF) 1 TO 2 HOURS BEFORE DIALYSIS. COVER WITH OCCLUSIVE DRESSING (SARAN WRAP) 09/24/18  Yes [provider]  losartan (COZAAR) 50 MG tablet Take 1 tablet (50 mg total) by  mouth daily. 03/28/18  Yes [name], MD  nebivolol (BYSTOLIC) 10 MG tablet Take 1 tablet (10 mg total) by mouth 2 (two) times daily. Patient taking differently: Take 10 mg by mouth 3 (three) times a week. Take on the days of dialysis Tuesdays, Thursdays, and Saturdays 06/13/18  Yes Pickard, Cammie Mcgee, MD  ondansetron (ZOFRAN) 4 MG tablet Take 1 tablet (4 mg total) by mouth every 8 (eight) hours as needed for nausea or vomiting. 09/18/18  Yes Aviva Signs, MD  pantoprazole (PROTONIX) 40 MG tablet TAKE 1 TABLET(40 MG) BY MOUTH DAILY 08/19/18  Yes [name], MD  prochlorperazine (COMPAZINE) 10 MG tablet Take 1 tablet (10 mg total) by mouth every 6 (six) hours as needed (Nausea or vomiting). 10/29/18  Yes Derek Jack, MD  tiZANidine (ZANAFLEX) 4 MG capsule TAKE 1 CAPSULE(4 MG) BY MOUTH TWICE  DAILY WITH A MEAL. START 1 CAPSULE AT NIGHT FOR 2 WEEKS AND THEN TWICE DAILY 01/14/19  Yes Garvin Fila, MD     No Known Allergies   Family History  Problem Relation Age of Onset  . Hypertension Mother   . Coronary artery disease Mother   . Diabetes Mother   . Hypertension Sister   . Coronary artery disease Sister   . Hypertension Brother   . Heart attack Father   . Hypertension Son   . Heart attack Maternal Aunt   . Hypertension Maternal Aunt   . Diabetes Maternal Aunt   . Heart attack Maternal Uncle   . Hypertension Maternal Uncle   . Diabetes Maternal Uncle   . Heart attack Paternal Aunt   . Hypertension Paternal Aunt   . Diabetes Paternal Aunt   . Heart attack Paternal Uncle   . Hypertension Paternal Uncle   . Diabetes Paternal Uncle   . Heart attack Maternal Grandmother   . Heart attack Maternal Grandfather   . Heart attack Paternal Grandmother   . Heart attack Paternal Grandfather   . Colon cancer Neg Hx      Social History   Socioeconomic History  . Marital status: Married    Spouse name: Not on file  . Number of children: Not on file  . Years of education: Not on file  . Highest education level: Not on file  Occupational History  . 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 Financial resource strain: Not hard at all  . Food insecurity    Worry: Never true    Inability: Never true  . Transportation needs    Medical: No    Non-medical: No  Tobacco Use  . Smoking status: Never Smoker  . Smokeless tobacco: Never Used  Substance and Sexual Activity  . Alcohol use: No  . Drug use: No  . Sexual activity: Yes    Birth control/protection: Surgical  Lifestyle  . Physical activity    Days per week: 0 days    Minutes per session: 0 min  . Stress: Not at all  Relationships  . Social Herbalist on phone: Twice a week    Gets together: Twice a week    Attends religious service: More than 4 times per year    Active  member of club or organization: No    Attends meetings of clubs or organizations: Never    Relationship status: Married  . Intimate partner violence    Fear of current or ex partner: No  Emotionally abused: No    Physically abused: No    Forced sexual activity: No  Other Topics Concern  . Not on file  Social History Narrative   Works at [employer] in [place].    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   Also has to get items from high shelves-causes achy pain in shoulder area      Married.   Children are grown, out of house.     Review of Systems  Constitutional: Negative.   HENT: Negative.   Eyes: Negative.   Respiratory: Negative.   Cardiovascular: Negative.   Gastrointestinal: Negative.   Endocrine: Negative.   Genitourinary: Negative.   Musculoskeletal: Negative.   Skin: Negative.   Allergic/Immunologic: Negative.   Neurological: Negative.   Hematological: Negative.   Psychiatric/Behavioral: Negative.   All other systems reviewed and are negative.      Objective:    Vitals:   02/19/19 0901  BP: (!) 152/90  Pulse: 86  Resp: 16  Temp: 98.7 F (37.1 C)  TempSrc: Oral  SpO2: 95%  Height: 5\' 2"  (1.575 m)      Physical Exam Vitals signs and nursing note reviewed.  Constitutional:      General: She is not in acute distress.    Appearance: She is well-developed. She is not toxic-appearing or diaphoretic.  HENT:     Head: Normocephalic and atraumatic.     Nose: Nose normal.  Eyes:     General:        Right eye: No discharge.        Left eye: No discharge.     Conjunctiva/sclera: Conjunctivae normal.  Neck:     Trachea: No tracheal deviation.  Cardiovascular:     Rate and Rhythm: Normal rate and regular rhythm.  No extrasystoles are present.    Chest Wall: No thrill.     Pulses: Normal pulses.          Radial pulses are 2+ on the right side.     Heart sounds: Heart sounds not distant. No friction rub. No gallop.      Comments: AV  fistula with palpable thrill Pulmonary:     Effort: Pulmonary effort is normal. No tachypnea, accessory muscle usage or respiratory distress.     Breath sounds: No stridor or decreased air movement. No decreased breath sounds or wheezing.     Comments: Scattered rhonchi and coarse crackles bilaterally to the lower lung fields , R>L Chest:     Chest wall: No tenderness or crepitus.  Musculoskeletal: Normal range of motion.     Right lower leg: No edema.     Left lower leg: No edema.  Skin:    General: Skin is warm and dry.     Capillary Refill: Capillary refill takes less than 2 seconds.     Coloration: Skin is not pale.     Findings: No bruising or rash.  Neurological:     Mental Status: She is alert.     Motor: No abnormal muscle tone.     Coordination: Coordination normal.  Psychiatric:        Behavior: Behavior normal.     EKG:  Will be scanned into chart - did not cross over Sinus rhythm, HR ~ 90, left axis deviation, non-specific t-wave abnormality  Ambulatory pulse oximetry:  Few laps around clinic HR 80's and SpO2 >95%    Assessment & Plan:      ICD-10-CM   1. Chest  pain, unspecified type  R07.9 EKG 12-Lead    CANCELED: Ambulatory referral to Cardiology  2. Pleurisy  R09.1     Sharp central chest pain reproducible with inspiration and positional changes, severe and no improvement with hydrocodone given to her by the ER.  This is a patient of Dr. Dennard Schaumann who she does have follow-up with tomorrow but pain was too severe today.  EKG done today in clinic was largely unremarkable and when compared to EKG from 5 days ago in the ER is only minor changes which may be to positioning of leads otherwise nonspecific T wave abnormalities and sinus rhythm without any peak T waves, ST elevation or depression.  Patient had a fairly extensive ER work-up and only a few weeks before that hospitalization.  In the ER BNP was significantly elevated, troponins were positive but decreasing in  cardiology was consulted agreed was unlikely to be cardiac in nature and patient was lot of discharge home.  ER provider did think it was pleuritic in nature which I agree does seem to be pleuritic chest pain only reproduced with breathing may be secondary to some mild coughing she had that has been improving and nearly better.  With all the recent imaging is unlikely to be new pericardial effusion or pleural effusion although with dialysis and rapid changes in fluid status acute fluid overload is always possible.  Patient's blood pressure is elevated today did repeated a few times in the exam room but it remained mildly elevated lowest I got was 148/90.  Patient was able to maintain oxygenation on room air with walk around the clinic briskly heart rate stayed in the 80s to 90s.  Chest pain is not reproducible with palpation of the chest wall so I do not suspect costochondritis.  I was going to refer to cardiology for further work-up with possible echo but later had time to further review records from her hospitalization that 2 to 3 weeks ago and echocardiogram was done showed left ventricular ejection fraction which was normal 55 to 60%, showed some left ventricular hypertrophy and no hypokinesis and no pericardial effusion or new acute valvular pathology.  Decided to treat with prednisone which does not need any renal dose adjustment, discussed with the patient with her husband dosing with a taper for 12 days starting with 40 mg for 3 days and decreasing to 30 then 20 then 10.  How that if it is pleurisy the anti-inflammatory properties will improve her pain.  Did give her a few days worth of oxycodone/Percocet since the hydrocodone did nothing for her, she was advised to not take the same time, have someone with her to monitor her effects of medication and her respirations.  In clinic today she states that she completed her dialysis yesterday without any difficulty she appears euvolemic.  Will have her start  treatment with steroids and pain meds and follow-up with PCP.  Other thoughts are that she may need to maximize medical therapy for CHF with something like Imdur?  Also ER EKG showed borderline prolonged QTc, EKG done today in clinic did not calculate rate or intervals so I cannot follow-up on that but would be careful to avoid QTC prolonging medications  Red flags reviewed with patient and her spouse and she was encouraged to have a low threshold to go to the ER for evaluation with any changes chest pain, dyspnea, etc, and they verbalized understanding.  Any repeat testing with labs I would want done in the ER for timely  results and ability to act on results.  Pt earlier refused to go to the ER and acknowledges the limitations of outpt eval.   Delsa Grana, PA-C 02/19/19 9:21 AM

## 2019-02-20 ENCOUNTER — Ambulatory Visit (INDEPENDENT_AMBULATORY_CARE_PROVIDER_SITE_OTHER): Payer: Medicare Other | Admitting: Family Medicine

## 2019-02-20 ENCOUNTER — Encounter: Payer: Self-pay | Admitting: Family Medicine

## 2019-02-20 VITALS — BP 140/80 | HR 82 | Temp 98.3°F | Resp 16 | Ht 62.0 in | Wt 164.0 lb

## 2019-02-20 DIAGNOSIS — R079 Chest pain, unspecified: Secondary | ICD-10-CM | POA: Diagnosis not present

## 2019-02-20 DIAGNOSIS — R7989 Other specified abnormal findings of blood chemistry: Secondary | ICD-10-CM | POA: Diagnosis not present

## 2019-02-20 DIAGNOSIS — D631 Anemia in chronic kidney disease: Secondary | ICD-10-CM | POA: Diagnosis not present

## 2019-02-20 DIAGNOSIS — E1129 Type 2 diabetes mellitus with other diabetic kidney complication: Secondary | ICD-10-CM | POA: Diagnosis not present

## 2019-02-20 DIAGNOSIS — N186 End stage renal disease: Secondary | ICD-10-CM | POA: Diagnosis not present

## 2019-02-20 DIAGNOSIS — R778 Other specified abnormalities of plasma proteins: Secondary | ICD-10-CM

## 2019-02-20 DIAGNOSIS — E119 Type 2 diabetes mellitus without complications: Secondary | ICD-10-CM | POA: Diagnosis not present

## 2019-02-20 DIAGNOSIS — N2581 Secondary hyperparathyroidism of renal origin: Secondary | ICD-10-CM | POA: Diagnosis not present

## 2019-02-20 NOTE — Progress Notes (Signed)
Subjective:    Patient ID: Megan [name], female    DOB: November 01, 1960, 58 y.o.   MRN: [id number]  HPI Patient was recently seen in the hospital for chest pain.  The pain was located in the sternal area.  It was sharp and stabbing in nature.  It was worse with deep inspiration.  It was worse with turning or rolling over in bed.  It was worse if she coughed.  To the hospital.  Her BNP was significantly elevated.  Her troponin levels were also elevated at 25.  She was in the hospital in June and her troponin levels were 0.08 and 0.11 so this was well beyond her previously established baseline as result of her chronic kidney disease which is hemodialysis dependent.  Her second troponin level had trended down to 23.  A CT angiogram was obtained of the chest with it was negative.  Patient had an EKG which showed no ST segment changes concerning for STEMI.  There was also no sign of pericarditis on the EKG.  Cardiology was consulted and since her troponins were trending down and the pain pattern was atypical for cardiac pain she was discharged with chest wall pain.  She saw my partner yesterday who started her on prednisone for possible costochondritis.  Since taking the prednisone, her pain is dramatically better.  She states that her pain is now approximately 90% resolved although she is still very fatigued and gets winded easily with minimal activity. Past Medical History:  Diagnosis Date  . Anemia   . Blood transfusion without reported diagnosis   . Cataract   . CKD (chronic kidney disease) stage 3, GFR 30-59 ml/min (HCC)   . ESRD (end stage renal disease) on dialysis ([place])    on HD T, T, S  . Essential hypertension, benign   . GERD (gastroesophageal reflux disease)   . Headache   . Hemorrhoids   . Mixed hyperlipidemia   . PONV (postoperative nausea and vomiting)   . Renal insufficiency   . S/P colonoscopy Jan 2011   Dr. Benson Norway: sessile polyp (benign lymphoid), large hemorrhoids, repeat 5-10  years  . Temporal arteritis ([place])   . Type 2 diabetes mellitus ([place])   . Wears glasses    Past Surgical History:  Procedure Laterality Date  . ABDOMINAL HYSTERECTOMY    . APPENDECTOMY    . ARTERY BIOPSY N/A 05/09/2018   Procedure: RIGHT TEMPORAL ARTERY BIOPSY;  Surgeon: Judeth Horn, MD;  Location: [facility];  Service: General;  Laterality: N/A;  . CATARACT EXTRACTION W/PHACO Left 02/09/2017   Procedure: CATARACT EXTRACTION PHACO AND INTRAOCULAR LENS PLACEMENT LEFT EYE;  Surgeon: Tonny Branch, MD;  [facility];  Service: Ophthalmology;  Laterality: Left;  CDE: 4.89  . CATARACT EXTRACTION W/PHACO Right 06/04/2017   Procedure: CATARACT EXTRACTION PHACO AND INTRAOCULAR LENS PLACEMENT (IOC);  Surgeon: Tonny Branch, MD;  Location: [facility];  Service: Ophthalmology;  Laterality: Right;  CDE: 4.12  . CHOLECYSTECTOMY  09/29/2011   Procedure: LAPAROSCOPIC CHOLECYSTECTOMY;  Surgeon: Jamesetta So, MD;  Location[facility];  Service: General;  Laterality: N/A;  . COLONOSCOPY  Jan 2011   Dr. Benson Norway: sessile polyp (benign lymphoid), large hemorrhoids, repeat 5-10 years  . COLONOSCOPY N/A 06/12/2016   Procedure: COLONOSCOPY;  Surgeon: Daneil Dolin, MD;  [facility];  Service: Endoscopy;  Laterality: N/A;  1230   . ESOPHAGOGASTRODUODENOSCOPY  09/05/2011   [id number] hiatal hernia; remainder of exam normal. No explanation for patient's abdominal pain  with today's examination  . ESOPHAGOGASTRODUODENOSCOPY N/A 12/17/2013   Dr. [name]: gastric erythema, erosion, mild chronic inflammation on path   . LAPAROSCOPIC APPENDECTOMY  09/29/2011   Procedure: APPENDECTOMY LAPAROSCOPIC;  Surgeon: Jamesetta So, MD;  Location[facility];  Service: General;;  incidental appendectomy  . PARTIAL MASTECTOMY WITH NEEDLE LOCALIZATION AND AXILLARY SENTINEL LYMPH NODE BX Right 09/18/2018   Procedure: RIGHT PARTIAL MASTECTOMY AFTER NEEDLE LOCALIZATION, SENTINEL LYMPH NODE BIOPSY RIGHT AXILLA;  Surgeon: Aviva Signs, MD;  [facility];  Service: General;  Laterality: Right;   Current Outpatient Medications on File Prior to Visit  Medication Sig Dispense Refill  . amLODipine (NORVASC) 5 MG tablet Take 1 tablet (5 mg total) by mouth daily. (Patient taking differently: Take 5 mg by mouth daily as needed (blood pressure 159 or above). ) 90 tablet 3  . anastrozole (ARIMIDEX) 1 MG tablet Take 1 tablet (1 mg total) by mouth daily. 30 tablet 4  . aspirin EC 81 MG tablet Take 81 mg by mouth daily.    Marland Kitchen atorvastatin (LIPITOR) 20 MG tablet Take 1 tablet (20 mg total) by mouth daily. 90 tablet 1  . B Complex-C-Folic Acid (DIALYVITE 962) 0.8 MG TABS Take 800 mg by mouth daily.     . butalbital-acetaminophen-caffeine (FIORICET, ESGIC) 50-325-40 MG tablet Take 1 tablet by mouth every 6 (six) hours as needed for headache. 20 tablet 0  . Calcium Carb-Cholecalciferol (CALCIUM 1000 + D PO) Take 1 tablet by mouth daily.     . cinacalcet (SENSIPAR) 30 MG tablet Take 2 tablets (60 mg total) by mouth daily. 90 tablet 0  . DULoxetine (CYMBALTA) 30 MG capsule TAKE 1 CAPSULE(30 MG) BY MOUTH DAILY 90 capsule 3  . gabapentin (NEURONTIN) 300 MG capsule TAKE 1 CAPSULE(300 MG) BY MOUTH AT BEDTIME 30 capsule 3  . HYDROcodone-acetaminophen (NORCO/VICODIN) 5-325 MG tablet Take 1 tablet by mouth every 6 (six) hours as needed for moderate pain. 10 tablet 0  . lanthanum (FOSRENOL) 1000 MG chewable tablet Chew 1,000-2,000 mg by mouth See admin instructions. Take 2000 mg by mouth twice daily with meals and 1000 with a snack    . lidocaine-prilocaine (EMLA) cream APPLY SMALL AMOUNT TO ACCESS SITE (AVF) 1 TO 2 HOURS BEFORE DIALYSIS. COVER WITH OCCLUSIVE DRESSING (SARAN WRAP)    . losartan (COZAAR) 50 MG tablet Take 1 tablet (50 mg total) by mouth daily. 90 tablet 3  . nebivolol (BYSTOLIC) 10 MG tablet Take 1 tablet (10 mg total) by mouth 2 (two) times daily. (Patient taking differently: Take 10 mg by mouth 3 (three) times a week. Take on the days of dialysis  Tuesdays, Thursdays, and Saturdays) 180 tablet 3  . ondansetron (ZOFRAN) 4 MG tablet Take 1 tablet (4 mg total) by mouth every 8 (eight) hours as needed for nausea or vomiting. 20 tablet 0  . oxyCODONE-acetaminophen (PERCOCET) 5-325 MG tablet Take 1-2 tablets by mouth every 8 (eight) hours as needed for up to 3 days for severe pain. 18 tablet 0  . pantoprazole (PROTONIX) 40 MG tablet TAKE 1 TABLET(40 MG) BY MOUTH DAILY 90 tablet 3  . predniSONE (DELTASONE) 10 MG tablet Take 40 mg poqam x 3d, then 30 mg poqam x 3d, 20 mg poqam x 3 d, 10 mg poqam 30 tablet 0  . prochlorperazine (COMPAZINE) 10 MG tablet Take 1 tablet (10 mg total) by mouth every 6 (six) hours as needed (Nausea or vomiting). 30 tablet 1  . tiZANidine (ZANAFLEX) 4 MG capsule TAKE  1 CAPSULE(4 MG) BY MOUTH TWICE DAILY WITH A MEAL. START 1 CAPSULE AT NIGHT FOR 2 WEEKS AND THEN TWICE DAILY 60 capsule 0  . HYDROcodone-acetaminophen (NORCO) 5-325 MG tablet Take 1 tablet by mouth every 4 (four) hours as needed for moderate pain. (Patient not taking: Reported on 02/20/2019) 30 tablet 0   No current facility-administered medications on file prior to visit.    No Known Allergies Social History   Socioeconomic History  . Marital status: Married    Spouse name: Not on file  . Number of children: Not on file  . Years of education: Not on file  . Highest education level: Not on file  Occupational History  . 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 Financial resource strain: Not hard at all  . Food insecurity    Worry: Never true    Inability: Never true  . Transportation needs    Medical: No    Non-medical: No  Tobacco Use  . Smoking status: Never Smoker  . Smokeless tobacco: Never Used  Substance and Sexual Activity  . Alcohol use: No  . Drug use: No  . Sexual activity: Yes    Birth control/protection: Surgical  Lifestyle  . Physical activity    Days per week: 0 days    Minutes per session: 0 min  .  Stress: Not at all  Relationships  . Social Herbalist on phone: Twice a week    Gets together: Twice a week    Attends religious service: More than 4 times per year    Active member of club or organization: No    Attends meetings of clubs or organizations: Never    Relationship status: Married  . Intimate partner violence    Fear of current or ex partner: No    Emotionally abused: No    Physically abused: No    Forced sexual activity: No  Other Topics Concern  . Not on file  Social History Narrative   Works at [employer] in [place].    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   Also has to get items from high shelves-causes achy pain in shoulder area      Married.   Children are grown, out of house.    Past Medical History:  Diagnosis Date  . Anemia   . Blood transfusion without reported diagnosis   . Cataract   . CKD (chronic kidney disease) stage 3, GFR 30-59 ml/min (HCC)   . ESRD (end stage renal disease) on dialysis ([place])    on HD T, T, S  . Essential hypertension, benign   . GERD (gastroesophageal reflux disease)   . Headache   . Hemorrhoids   . Mixed hyperlipidemia   . PONV (postoperative nausea and vomiting)   . Renal insufficiency   . S/P colonoscopy Jan 2011   Dr. Benson Norway: sessile polyp (benign lymphoid), large hemorrhoids, repeat 5-10 years  . Temporal arteritis ([place])   . Type 2 diabetes mellitus ([place])   . Wears glasses    Past Surgical History:  Procedure Laterality Date  . ABDOMINAL HYSTERECTOMY    . APPENDECTOMY    . ARTERY BIOPSY N/A 05/09/2018   Procedure: RIGHT TEMPORAL ARTERY BIOPSY;  Surgeon: Judeth Horn, MD;  Location: [facility];  Service: General;  Laterality: N/A;  . CATARACT EXTRACTION W/PHACO Left 02/09/2017   Procedure: CATARACT EXTRACTION PHACO AND INTRAOCULAR LENS PLACEMENT  LEFT EYE;  Surgeon: Tonny Branch, MD;  [facility];  Service: Ophthalmology;  Laterality: Left;  CDE: 4.89  . CATARACT EXTRACTION W/PHACO Right  06/04/2017   Procedure: CATARACT EXTRACTION PHACO AND INTRAOCULAR LENS PLACEMENT (IOC);  Surgeon: Tonny Branch, MD;  [facility];  Service: Ophthalmology;  Laterality: Right;  CDE: 4.12  . CHOLECYSTECTOMY  09/29/2011   Procedure: LAPAROSCOPIC CHOLECYSTECTOMY;  Surgeon: Jamesetta So, MD;  [facility];  Service: General;  Laterality: N/A;  . COLONOSCOPY  Jan 2011   Dr. Benson Norway: sessile polyp (benign lymphoid), large hemorrhoids, repeat 5-10 years  . COLONOSCOPY N/A 06/12/2016   Procedure: COLONOSCOPY;  Surgeon: Daneil Dolin, MD;  [facility];  Service: Endoscopy;  Laterality: N/A;  1230   . ESOPHAGOGASTRODUODENOSCOPY  09/05/2011   POE:[id number] hiatal hernia; remainder of exam normal. No explanation for patient's abdominal pain with today's examination  . ESOPHAGOGASTRODUODENOSCOPY N/A 12/17/2013   Dr. [name]: gastric erythema, erosion, mild chronic inflammation on path   . LAPAROSCOPIC APPENDECTOMY  09/29/2011   Procedure: APPENDECTOMY LAPAROSCOPIC;  Surgeon: Jamesetta So, MD;  [facility];  Service: General;;  incidental appendectomy  . PARTIAL MASTECTOMY WITH NEEDLE LOCALIZATION AND AXILLARY SENTINEL LYMPH NODE BX Right 09/18/2018   Procedure: RIGHT PARTIAL MASTECTOMY AFTER NEEDLE LOCALIZATION, SENTINEL LYMPH NODE BIOPSY RIGHT AXILLA;  Surgeon: Aviva Signs, MD;  [facility];  Service: General;  Laterality: Right;   Current Outpatient Medications on File Prior to Visit  Medication Sig Dispense Refill  . amLODipine (NORVASC) 5 MG tablet Take 1 tablet (5 mg total) by mouth daily. (Patient taking differently: Take 5 mg by mouth daily as needed (blood pressure 159 or above). ) 90 tablet 3  . anastrozole (ARIMIDEX) 1 MG tablet Take 1 tablet (1 mg total) by mouth daily. 30 tablet 4  . aspirin EC 81 MG tablet Take 81 mg by mouth daily.    Marland Kitchen atorvastatin (LIPITOR) 20 MG tablet Take 1 tablet (20 mg total) by mouth daily. 90 tablet 1  . B Complex-C-Folic Acid (DIALYVITE 614) 0.8  MG TABS Take 800 mg by mouth daily.     . butalbital-acetaminophen-caffeine (FIORICET, ESGIC) 50-325-40 MG tablet Take 1 tablet by mouth every 6 (six) hours as needed for headache. 20 tablet 0  . Calcium Carb-Cholecalciferol (CALCIUM 1000 + D PO) Take 1 tablet by mouth daily.     . cinacalcet (SENSIPAR) 30 MG tablet Take 2 tablets (60 mg total) by mouth daily. 90 tablet 0  . DULoxetine (CYMBALTA) 30 MG capsule TAKE 1 CAPSULE(30 MG) BY MOUTH DAILY 90 capsule 3  . gabapentin (NEURONTIN) 300 MG capsule TAKE 1 CAPSULE(300 MG) BY MOUTH AT BEDTIME 30 capsule 3  . HYDROcodone-acetaminophen (NORCO/VICODIN) 5-325 MG tablet Take 1 tablet by mouth every 6 (six) hours as needed for moderate pain. 10 tablet 0  . lanthanum (FOSRENOL) 1000 MG chewable tablet Chew 1,000-2,000 mg by mouth See admin instructions. Take 2000 mg by mouth twice daily with meals and 1000 with a snack    . lidocaine-prilocaine (EMLA) cream APPLY SMALL AMOUNT TO ACCESS SITE (AVF) 1 TO 2 HOURS BEFORE DIALYSIS. COVER WITH OCCLUSIVE DRESSING (SARAN WRAP)    . losartan (COZAAR) 50 MG tablet Take 1 tablet (50 mg total) by mouth daily. 90 tablet 3  . nebivolol (BYSTOLIC) 10 MG tablet Take 1 tablet (10 mg total) by mouth 2 (two) times daily. (Patient taking differently: Take 10 mg by mouth 3 (three) times a week. Take on the  days of dialysis Tuesdays, Thursdays, and Saturdays) 180 tablet 3  . ondansetron (ZOFRAN) 4 MG tablet Take 1 tablet (4 mg total) by mouth every 8 (eight) hours as needed for nausea or vomiting. 20 tablet 0  . oxyCODONE-acetaminophen (PERCOCET) 5-325 MG tablet Take 1-2 tablets by mouth every 8 (eight) hours as needed for up to 3 days for severe pain. 18 tablet 0  . pantoprazole (PROTONIX) 40 MG tablet TAKE 1 TABLET(40 MG) BY MOUTH DAILY 90 tablet 3  . predniSONE (DELTASONE) 10 MG tablet Take 40 mg poqam x 3d, then 30 mg poqam x 3d, 20 mg poqam x 3 d, 10 mg poqam 30 tablet 0  . prochlorperazine (COMPAZINE) 10 MG tablet Take 1  tablet (10 mg total) by mouth every 6 (six) hours as needed (Nausea or vomiting). 30 tablet 1  . tiZANidine (ZANAFLEX) 4 MG capsule TAKE 1 CAPSULE(4 MG) BY MOUTH TWICE DAILY WITH A MEAL. START 1 CAPSULE AT NIGHT FOR 2 WEEKS AND THEN TWICE DAILY 60 capsule 0  . HYDROcodone-acetaminophen (NORCO) 5-325 MG tablet Take 1 tablet by mouth every 4 (four) hours as needed for moderate pain. (Patient not taking: Reported on 02/20/2019) 30 tablet 0   No current facility-administered medications on file prior to visit.    No Known Allergies Social History   Socioeconomic History  . Marital status: Married    Spouse name: Not on file  . Number of children: Not on file  . Years of education: Not on file  . Highest education level: Not on file  Occupational History  . 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 Financial resource strain: Not hard at all  . Food insecurity    Worry: Never true    Inability: Never true  . Transportation needs    Medical: No    Non-medical: No  Tobacco Use  . Smoking status: Never Smoker  . Smokeless tobacco: Never Used  Substance and Sexual Activity  . Alcohol use: No  . Drug use: No  . Sexual activity: Yes    Birth control/protection: Surgical  Lifestyle  . Physical activity    Days per week: 0 days    Minutes per session: 0 min  . Stress: Not at all  Relationships  . Social Herbalist on phone: Twice a week    Gets together: Twice a week    Attends religious service: More than 4 times per year    Active member of club or organization: No    Attends meetings of clubs or organizations: Never    Relationship status: Married  . Intimate partner violence    Fear of current or ex partner: No    Emotionally abused: No    Physically abused: No    Forced sexual activity: No  Other Topics Concern  . Not on file  Social History Narrative   Works at [employer] in [place].    When trucks come, she has to put items in their  places.   Also has to get items from high shelves-causes achy pain in shoulder area      Married.   Children are grown, out of house.      Review of Systems  All other systems reviewed and are negative.      Objective:   Physical Exam Vitals signs reviewed.  Constitutional:      General: She is not in acute distress.    Appearance:  Normal appearance. She is normal weight.  Cardiovascular:     Rate and Rhythm: Normal rate and regular rhythm.     Pulses: Normal pulses.     Heart sounds: Murmur (thrill from AV fistual heard as murmur) present.  Pulmonary:     Effort: Pulmonary effort is normal. No respiratory distress.     Breath sounds: Normal breath sounds. No stridor. No wheezing, rhonchi or rales.  Chest:     Chest wall: No tenderness.  Musculoskeletal:     Right lower leg: No edema.     Left lower leg: No edema.  Neurological:     Mental Status: She is alert.           Assessment & Plan:  1. Chest pain in adult Patient's chest pain has responded dramatically to prednisone.  I do not feel her pain was costochondritis however.  She has no tenderness at the costochondral junction.  Furthermore I cannot explain the elevation in her cardiac enzymes based on costochondritis.  I question if the patient may have had pericarditis.  There was no significant pericardial effusion seen on the CT scan of the chest although she has not had an echocardiogram performed since she developed chest pain.  Her echocardiogram in June of this year was relatively normal.  I would like to consult cardiology for a second opinion.  I will repeat troponin to monitor the trend.  I explained to the patient that I am uncertain of her diagnosis.  If the pain returns I want her to go directly back to the emergency room.  However at the present time I believe we may be treating some type of atypical pericarditis with prednisone.  The patient may require cardiogram to rule out pericardial effusion.   Therefore I will try to expedite cardiology referral.  I believe non-STEMI is unlikely given the fact the patient had a normal stress test in January with no evidence of ischemia - Troponin I

## 2019-02-21 ENCOUNTER — Other Ambulatory Visit: Payer: Self-pay | Admitting: Family Medicine

## 2019-02-21 LAB — TROPONIN I: Troponin I: 0.09 ng/mL (ref <=0.0)

## 2019-02-22 DIAGNOSIS — N2581 Secondary hyperparathyroidism of renal origin: Secondary | ICD-10-CM | POA: Diagnosis not present

## 2019-02-22 DIAGNOSIS — D631 Anemia in chronic kidney disease: Secondary | ICD-10-CM | POA: Diagnosis not present

## 2019-02-22 DIAGNOSIS — N186 End stage renal disease: Secondary | ICD-10-CM | POA: Diagnosis not present

## 2019-02-22 DIAGNOSIS — E1129 Type 2 diabetes mellitus with other diabetic kidney complication: Secondary | ICD-10-CM | POA: Diagnosis not present

## 2019-02-22 DIAGNOSIS — E119 Type 2 diabetes mellitus without complications: Secondary | ICD-10-CM | POA: Diagnosis not present

## 2019-02-24 ENCOUNTER — Encounter (HOSPITAL_COMMUNITY): Payer: Self-pay | Admitting: Emergency Medicine

## 2019-02-24 ENCOUNTER — Emergency Department (HOSPITAL_COMMUNITY)
Admission: EM | Admit: 2019-02-24 | Discharge: 2019-02-24 | Disposition: A | Discharge status: Home / Self Care | Payer: Medicare Other | Attending: Emergency Medicine | Admitting: Emergency Medicine

## 2019-02-24 ENCOUNTER — Emergency Department (HOSPITAL_COMMUNITY): Payer: Medicare Other

## 2019-02-24 ENCOUNTER — Other Ambulatory Visit: Payer: Self-pay

## 2019-02-24 DIAGNOSIS — J181 Lobar pneumonia, unspecified organism: Secondary | ICD-10-CM | POA: Insufficient documentation

## 2019-02-24 DIAGNOSIS — Z862 Personal history of diseases of the blood and blood-forming organs and certain disorders involving the immune mechanism: Secondary | ICD-10-CM | POA: Insufficient documentation

## 2019-02-24 DIAGNOSIS — Z79899 Other long term (current) drug therapy: Secondary | ICD-10-CM | POA: Insufficient documentation

## 2019-02-24 DIAGNOSIS — I5032 Chronic diastolic (congestive) heart failure: Secondary | ICD-10-CM | POA: Diagnosis not present

## 2019-02-24 DIAGNOSIS — Z992 Dependence on renal dialysis: Secondary | ICD-10-CM | POA: Diagnosis not present

## 2019-02-24 DIAGNOSIS — N186 End stage renal disease: Secondary | ICD-10-CM | POA: Diagnosis not present

## 2019-02-24 DIAGNOSIS — J189 Pneumonia, unspecified organism: Secondary | ICD-10-CM

## 2019-02-24 DIAGNOSIS — Z853 Personal history of malignant neoplasm of breast: Secondary | ICD-10-CM | POA: Insufficient documentation

## 2019-02-24 DIAGNOSIS — E1122 Type 2 diabetes mellitus with diabetic chronic kidney disease: Secondary | ICD-10-CM | POA: Diagnosis not present

## 2019-02-24 DIAGNOSIS — Z7982 Long term (current) use of aspirin: Secondary | ICD-10-CM | POA: Diagnosis not present

## 2019-02-24 DIAGNOSIS — R0789 Other chest pain: Secondary | ICD-10-CM | POA: Diagnosis not present

## 2019-02-24 DIAGNOSIS — R079 Chest pain, unspecified: Secondary | ICD-10-CM | POA: Diagnosis not present

## 2019-02-24 DIAGNOSIS — K219 Gastro-esophageal reflux disease without esophagitis: Secondary | ICD-10-CM | POA: Diagnosis not present

## 2019-02-24 DIAGNOSIS — I132 Hypertensive heart and chronic kidney disease with heart failure and with stage 5 chronic kidney disease, or end stage renal disease: Secondary | ICD-10-CM | POA: Diagnosis not present

## 2019-02-24 LAB — COMPREHENSIVE METABOLIC PANEL
ALT: 62 U/L — ABNORMAL HIGH (ref 0–44)
AST: 47 U/L — ABNORMAL HIGH (ref 15–41)
Albumin: 3 g/dL — ABNORMAL LOW (ref 3.5–5.0)
Alkaline Phosphatase: 166 U/L — ABNORMAL HIGH (ref 38–126)
Anion gap: 17 — ABNORMAL HIGH (ref 5–15)
BUN: 84 mg/dL — ABNORMAL HIGH (ref 6–20)
CO2: 27 mmol/L (ref 22–32)
Calcium: 9.2 mg/dL (ref 8.9–10.3)
Chloride: 94 mmol/L — ABNORMAL LOW (ref 98–111)
Creatinine, Ser: 7 mg/dL — ABNORMAL HIGH (ref 0.44–1.00)
GFR calc Af Amer: 7 mL/min — ABNORMAL LOW (ref >60)
GFR calc non Af Amer: 6 mL/min — ABNORMAL LOW (ref >60)
Glucose, Bld: 157 mg/dL — ABNORMAL HIGH (ref 70–99)
Potassium: 4.8 mmol/L (ref 3.5–5.1)
Sodium: 138 mmol/L (ref 135–145)
Total Bilirubin: 0.9 mg/dL (ref 0.3–1.2)
Total Protein: 7 g/dL (ref 6.5–8.1)

## 2019-02-24 LAB — TROPONIN I (HIGH SENSITIVITY)
Troponin I (High Sensitivity): 9 ng/L (ref <18)
Troponin I (High Sensitivity): 9 ng/L (ref <18)

## 2019-02-24 LAB — CBC WITH DIFFERENTIAL/PLATELET
Abs Immature Granulocytes: 0.22 10*3/uL — ABNORMAL HIGH (ref 0.00–0.07)
Basophils Absolute: 0 10*3/uL (ref 0.0–0.1)
Basophils Relative: 0 %
Eosinophils Absolute: 0 10*3/uL (ref 0.0–0.5)
Eosinophils Relative: 0 %
HCT: 29 % — ABNORMAL LOW (ref 36.0–46.0)
Hemoglobin: 8.9 g/dL — ABNORMAL LOW (ref 12.0–15.0)
Immature Granulocytes: 1 %
Lymphocytes Relative: 6 %
Lymphs Abs: 1 10*3/uL (ref 0.7–4.0)
MCH: 32.1 pg (ref 26.0–34.0)
MCHC: 30.7 g/dL (ref 30.0–36.0)
MCV: 104.7 fL — ABNORMAL HIGH (ref 80.0–100.0)
Monocytes Absolute: 1.5 10*3/uL — ABNORMAL HIGH (ref 0.1–1.0)
Monocytes Relative: 9 %
Neutro Abs: 14.4 10*3/uL — ABNORMAL HIGH (ref 1.7–7.7)
Neutrophils Relative %: 84 %
Platelets: 537 10*3/uL — ABNORMAL HIGH (ref 150–400)
RBC: 2.77 MIL/uL — ABNORMAL LOW (ref 3.87–5.11)
RDW: 18.8 % — ABNORMAL HIGH (ref 11.5–15.5)
WBC: 17 10*3/uL — ABNORMAL HIGH (ref 4.0–10.5)
nRBC: 0.8 % — ABNORMAL HIGH (ref 0.0–0.2)

## 2019-02-24 LAB — BRAIN NATRIURETIC PEPTIDE: B Natriuretic Peptide: >4500 pg/mL — ABNORMAL HIGH (ref 0.0–100.0)

## 2019-02-24 MED ORDER — HYDROCODONE-ACETAMINOPHEN 5-325 MG PO TABS: 1.0000 | ORAL_TABLET | ORAL | 0 refills | Status: DC | PRN

## 2019-02-24 MED ORDER — AZITHROMYCIN 250 MG PO TABS: 250.0000 mg | ORAL_TABLET | Freq: Every day | ORAL | 0 refills | Status: DC

## 2019-02-24 MED ORDER — PREDNISONE 10 MG PO TABS: ORAL_TABLET | ORAL | 0 refills | Status: DC

## 2019-02-24 MED ORDER — ASPIRIN 81 MG PO CHEW
243.0000 mg | CHEWABLE_TABLET | Freq: Once | ORAL | Status: AC
  Administered 2019-02-24: 243 mg via ORAL
  Filled 2019-02-24: qty 3

## 2019-02-24 MED ORDER — AZITHROMYCIN 250 MG PO TABS
500.0000 mg | ORAL_TABLET | Freq: Once | ORAL | Status: AC
  Administered 2019-02-24: 500 mg via ORAL
  Filled 2019-02-24: qty 2

## 2019-02-24 MED ORDER — MORPHINE SULFATE (PF) 4 MG/ML IV SOLN
4.0000 mg | Freq: Once | INTRAVENOUS | Status: AC
  Administered 2019-02-24: 4 mg via INTRAVENOUS
  Filled 2019-02-24: qty 1

## 2019-02-24 MED ORDER — ONDANSETRON HCL 4 MG/2ML IJ SOLN
4.0000 mg | Freq: Once | INTRAMUSCULAR | Status: AC
  Administered 2019-02-24: 4 mg via INTRAVENOUS
  Filled 2019-02-24: qty 2

## 2019-02-24 NOTE — ED Notes (Signed)
PT had hydrocodone at home around 0800 this morning prior to ED arrival and reports was the last pill she had. EDP made aware of c/o continued pain to chest.

## 2019-02-24 NOTE — ED Triage Notes (Signed)
PT c/o middle stabbing chest pain reoccurring and starting again this am around 0700. PT states recent dx of pleurisy and took 81mg  of aspirin prior to ED arrival. PT states had dialysis this past Saturday.

## 2019-02-24 NOTE — ED Provider Notes (Signed)
[facility] Provider Note   CSN: [id number] Arrival date & time: 02/24/19  0930    History   Chief Complaint Chief Complaint  Patient presents with  . Chest Pain    HPI Megan [name] is a 58 y.o. female with a history of chronic kidney disease Tuesday Thursday Saturday dialysis, hypertension, GERD, anemia requiring transfusion last month,, type 2 diabetes, history of breast cancer and diastolic CHF presenting with worsening midsternal chest pain which she describes as sharp, stabbing constant which woke her around 7 AM today.  This pain is worsened with deep inspiration, lying down and with certain movements.  She was seen here for similar symptoms 10 days ago and diagnosed with pleurisy after delta troponins were stable and CT angios was negative for pulmonary embolism.  She was seen by her PCP 5 days ago and placed on a prednisone taper which greatly improved her symptoms (until this morning) suggesting possible pericarditis as source of pain.  She was admitted for shortness of breath last month during which time an echocardiogram was revealing for an ejection fraction of 55 to 60% with LVH along with mitral valve thickening and a torn mitral valve chordae with no pericardial effusion.    She is anticipating outpatient follow-up with cardiology, referral by her PCP at last weeks outpatient visit.       The history is provided by the patient.    Past Medical History:  Diagnosis Date  . Anemia   . Blood transfusion without reported diagnosis   . Cataract   . CKD (chronic kidney disease) stage 3, GFR 30-59 ml/min (HCC)   . ESRD (end stage renal disease) on dialysis ([place])    on HD T, T, S  . Essential hypertension, benign   . GERD (gastroesophageal reflux disease)   . Headache   . Hemorrhoids   . Mixed hyperlipidemia   . PONV (postoperative nausea and vomiting)   . Renal insufficiency   . S/P colonoscopy Jan 2011   Dr. Benson Norway: sessile polyp (benign  lymphoid), large hemorrhoids, repeat 5-10 years  . Temporal arteritis ([place])   . Type 2 diabetes mellitus ([place])   . Wears glasses     Patient Active Problem List   Diagnosis Date Noted  . Elevated troponin 02/03/2019  . ESRD (end stage renal disease) ([place]) 02/03/2019  . Acute diastolic CHF (congestive heart failure) ([place]) 02/03/2019  . Hypokalemia 02/03/2019  . Acute respiratory failure with hypoxia ([place]) 02/03/2019  . 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[place])   . Rectal bleeding 06/08/2016  . Vaginal discharge 12/30/2015  . Abdominal pain, epigastric 11/26/2013  . GERD (gastroesophageal reflux disease)   . Type 2 diabetes mellitus ([place])   . CKD (chronic kidney disease) stage 3, GFR 30-59 ml/min (HCC)   . External hemorrhoid 08/01/2011  . Hematochezia 07/13/2011  . Dyspnea 04/21/2010  . Atypical chest pain 04/21/2010  . Mixed hyperlipidemia 03/11/2008  . OBESITY 03/11/2008  . Essential hypertension, benign 03/11/2008    Past Surgical History:  Procedure Laterality Date  . ABDOMINAL HYSTERECTOMY    . APPENDECTOMY    . ARTERY BIOPSY N/A 05/09/2018   Procedure: RIGHT TEMPORAL ARTERY BIOPSY;  Surgeon: Judeth Horn, MD;  Location: [facility];  Service: General;  Laterality: N/A;  . CATARACT EXTRACTION W/PHACO Left 02/09/2017   Procedure: CATARACT EXTRACTION PHACO AND INTRAOCULAR LENS PLACEMENT LEFT EYE;  Surgeon: Tonny Branch, MD;  Location: [facility];  Service: Ophthalmology;  Laterality: Left;  CDE: 4.89  . CATARACT EXTRACTION W/PHACO Right 06/04/2017   Procedure: CATARACT EXTRACTION PHACO AND INTRAOCULAR LENS PLACEMENT (IOC);  Surgeon: Tonny Branch, MD;  Location: [facility];  Service: Ophthalmology;  Laterality: Right;  CDE: 4.12  . CHOLECYSTECTOMY  09/29/2011   Procedure: LAPAROSCOPIC CHOLECYSTECTOMY;  Surgeon: Jamesetta So, MD;  Location: [facility];  Service: General;  Laterality: N/A;  . COLONOSCOPY  Jan 2011   Dr. Benson Norway: sessile polyp (benign lymphoid), large  hemorrhoids, repeat 5-10 years  . COLONOSCOPY N/A 06/12/2016   Procedure: COLONOSCOPY;  Surgeon: Daneil Dolin, MD;  [facility];  Service: Endoscopy;  Laterality: N/A;  1230   . ESOPHAGOGASTRODUODENOSCOPY  09/05/2011   GUR:KYHCW hiatal hernia; remainder of exam normal. No explanation for patient's abdominal pain with today's examination  . ESOPHAGOGASTRODUODENOSCOPY N/A 12/17/2013   Dr. [name]: gastric erythema, erosion, mild chronic inflammation on path   . LAPAROSCOPIC APPENDECTOMY  09/29/2011   Procedure: APPENDECTOMY LAPAROSCOPIC;  Surgeon: Jamesetta So, MD;  Location: [facility];  Service: General;;  incidental appendectomy  . PARTIAL MASTECTOMY WITH NEEDLE LOCALIZATION AND AXILLARY SENTINEL LYMPH NODE BX Right 09/18/2018   Procedure: RIGHT PARTIAL MASTECTOMY AFTER NEEDLE LOCALIZATION, SENTINEL LYMPH NODE BIOPSY RIGHT AXILLA;  Surgeon: Aviva Signs, MD;  Location: [facility];  Service: General;  Laterality: Right;     OB History    Gravida      Para      Term      Preterm      AB      Living  1     SAB      TAB      Ectopic      Multiple      Live Births               Home Medications    Prior to Admission medications   Medication Sig Start Date End Date Taking? Authorizing Provider  amLODipine (NORVASC) 5 MG tablet Take 1 tablet (5 mg total) by mouth daily. Patient taking differently: Take 5 mg by mouth daily as needed (blood pressure 159 or above).  08/19/18  Yes [name], MD  anastrozole (ARIMIDEX) 1 MG tablet Take 1 tablet (1 mg total) by mouth daily. 01/29/19  Yes Derek Jack, MD  aspirin EC 81 MG tablet Take 81 mg by mouth daily.   Yes [provider]  atorvastatin (LIPITOR) 20 MG tablet TAKE 1 TABLET DAILY 02/24/19  Yes [name], MD  B Complex-C-Folic Acid (DIALYVITE 237) 0.8 MG TABS Take 800 mg by mouth daily.  01/22/18  Yes [provider]  Calcium Carb-Cholecalciferol (CALCIUM 1000 + D PO) Take 1 tablet by  mouth daily.    Yes [provider]  cinacalcet (SENSIPAR) 30 MG tablet Take 2 tablets (60 mg total) by mouth daily. 06/13/18  Yes [name], MD  gabapentin (NEURONTIN) 300 MG capsule TAKE 1 CAPSULE(300 MG) BY MOUTH AT BEDTIME 02/17/19  Yes [name], MD  Investigational vitamin D 600 UNITS capsule SWOG [id number] Take 600 Units by mouth daily. Take with food.   Yes [provider]  lidocaine-prilocaine (EMLA) cream APPLY SMALL AMOUNT TO ACCESS SITE (AVF) 1 TO 2 HOURS BEFORE DIALYSIS. COVER WITH OCCLUSIVE DRESSING (SARAN WRAP) 09/24/18  Yes [provider]  losartan (COZAAR) 50 MG tablet Take 1 tablet (50 mg total) by mouth daily. 03/28/18  Yes [name], MD  nebivolol (BYSTOLIC) 10 MG tablet Take 1 tablet (10 mg  total) by mouth 2 (two) times daily. Patient taking differently: Take 10 mg by mouth 3 (three) times a week. Take on the days of dialysis Tuesdays, Thursdays, and Saturdays 06/13/18  Yes [name], MD  pantoprazole (PROTONIX) 40 MG tablet TAKE 1 TABLET(40 MG) BY MOUTH DAILY 08/19/18  Yes [name], MD  tiZANidine (ZANAFLEX) 4 MG capsule TAKE 1 CAPSULE(4 MG) BY MOUTH TWICE DAILY WITH A MEAL. START 1 CAPSULE AT NIGHT FOR 2 WEEKS AND THEN TWICE DAILY 01/14/19  Yes Garvin Fila, MD  azithromycin (ZITHROMAX) 250 MG tablet Take 1 tablet (250 mg total) by mouth daily. 02/24/19   Evalee Jefferson, PA-C  butalbital-acetaminophen-caffeine (FIORICET, ESGIC) 50-325-40 MG tablet Take 1 tablet by mouth every 6 (six) hours as needed for headache. Patient not taking: Reported on 02/24/2019 06/04/18 06/04/19  [name], MD  DULoxetine (CYMBALTA) 30 MG capsule TAKE 1 CAPSULE(30 MG) BY MOUTH DAILY Patient not taking: Reported on 02/24/2019 02/10/19   [name], MD  fluconazole (DIFLUCAN) 150 MG tablet Take 1 tablet by mouth daily. 02/08/19   [provider]  HYDROcodone-acetaminophen (NORCO/VICODIN) 5-325 MG tablet Take 1 tablet by mouth  every 4 (four) hours as needed. 02/24/19   Dannilynn Gallina, Almyra Free, PA-C  ondansetron (ZOFRAN) 4 MG tablet Take 1 tablet (4 mg total) by mouth every 8 (eight) hours as needed for nausea or vomiting. Patient not taking: Reported on 02/24/2019 09/18/18   Aviva Signs, MD  predniSONE (DELTASONE) 10 MG tablet Take 4 tablets daily for 5 days, then 3 tablets daily for 5 days, 2 tablets daily for 3 days, 1 tablet daily for 3 days then 1/2 tablet daily for 3 days. 02/24/19   Evalee Jefferson, PA-C  prochlorperazine (COMPAZINE) 10 MG tablet Take 1 tablet (10 mg total) by mouth every 6 (six) hours as needed (Nausea or vomiting). Patient not taking: Reported on 02/24/2019 10/29/18   Derek Jack, MD    Family History Family History  Problem Relation Age of Onset  . Hypertension Mother   . Coronary artery disease Mother   . Diabetes Mother   . Hypertension Sister   . Coronary artery disease Sister   . Hypertension Brother   . Heart attack Father   . Hypertension Son   . Heart attack Maternal Aunt   . Hypertension Maternal Aunt   . Diabetes Maternal Aunt   . Heart attack Maternal Uncle   . Hypertension Maternal Uncle   . Diabetes Maternal Uncle   . Heart attack Paternal Aunt   . Hypertension Paternal Aunt   . Diabetes Paternal Aunt   . Heart attack Paternal Uncle   . Hypertension Paternal Uncle   . Diabetes Paternal Uncle   . Heart attack Maternal Grandmother   . Heart attack Maternal Grandfather   . Heart attack Paternal Grandmother   . Heart attack Paternal Grandfather   . Colon cancer Neg Hx     Social History Social History   Tobacco Use  . Smoking status: Never Smoker  . Smokeless tobacco: Never Used  Substance Use Topics  . Alcohol use: No  . Drug use: No     Allergies   Patient has no known allergies.   Review of Systems Review of Systems  Constitutional: Negative for fever.  HENT: Negative for congestion and sore throat.   Eyes: Negative.   Respiratory: Negative for chest  tightness and shortness of breath.   Cardiovascular: Positive for chest pain. Negative for palpitations and leg swelling.  Gastrointestinal: Negative for abdominal pain and nausea.  Genitourinary: Negative.   Musculoskeletal: Negative for arthralgias, joint swelling and neck pain.  Skin: Negative.  Negative for rash and wound.  Neurological: Negative for dizziness, weakness, light-headedness, numbness and headaches.  Psychiatric/Behavioral: Negative.      Physical Exam Updated Vital Signs BP (!) 161/95   Pulse 81   Temp 97.8 F (36.6 C) (Oral)   Resp (!) 25   Ht 5\' 2"  (1.575 m)   Wt 74.8 kg   SpO2 96%   BMI 30.18 kg/m   Physical Exam Vitals signs and nursing note reviewed.  Constitutional:      Appearance: She is well-developed.  HENT:     Head: Normocephalic and atraumatic.  Eyes:     Conjunctiva/sclera: Conjunctivae normal.  Neck:     Musculoskeletal: Normal range of motion.  Cardiovascular:     Rate and Rhythm: Normal rate and regular rhythm.     Heart sounds: Normal heart sounds.  Pulmonary:     Effort: Pulmonary effort is normal.     Breath sounds: Normal breath sounds. No wheezing.  Abdominal:     General: Bowel sounds are normal.     Palpations: Abdomen is soft.     Tenderness: There is no abdominal tenderness.  Musculoskeletal: Normal range of motion.  Skin:    General: Skin is warm and dry.  Neurological:     Mental Status: She is alert.      ED Treatments / Results  Labs (all labs ordered are listed, but only abnormal results are displayed) Labs Reviewed  CBC WITH DIFFERENTIAL/PLATELET - Abnormal; Notable for the following components:      Result Value   WBC 17.0 (*)    RBC 2.77 (*)    Hemoglobin 8.9 (*)    HCT 29.0 (*)    MCV 104.7 (*)    RDW 18.8 (*)    Platelets 537 (*)    nRBC 0.8 (*)    Neutro Abs 14.4 (*)    Monocytes Absolute 1.5 (*)    Abs Immature Granulocytes 0.22 (*)    All other components within normal limits  COMPREHENSIVE  METABOLIC PANEL - Abnormal; Notable for the following components:   Chloride 94 (*)    Glucose, Bld 157 (*)    BUN 84 (*)    Creatinine, Ser 7.00 (*)    Albumin 3.0 (*)    AST 47 (*)    ALT 62 (*)    Alkaline Phosphatase 166 (*)    GFR calc non Af Amer 6 (*)    GFR calc Af Amer 7 (*)    Anion gap 17 (*)    All other components within normal limits  BRAIN NATRIURETIC PEPTIDE - Abnormal; Notable for the following components:   B Natriuretic Peptide >4,500.0 (*)    All other components within normal limits  TROPONIN I (HIGH SENSITIVITY)  TROPONIN I (HIGH SENSITIVITY)    EKG EKG Interpretation  Date/Time:  Monday February 24 2019 09:41:15 EDT Ventricular Rate:  75 PR Interval:    QRS Duration: 98 QT Interval:  419 QTC Calculation: 468 R Axis:   25 Text Interpretation:  Sinus rhythm Probable left atrial enlargement Nonspecific T abnormalities, lateral leads Baseline wander When compared with ECG of 02/14/2019 Nonspecific T wave abnormality Lateral leads is now Present Confirmed by Francine Graven [phone]) on 02/24/2019 9:55:09 AM   Radiology Dg Chest Portable 1 View  Result Date: 02/24/2019 CLINICAL DATA:  Acute chest pain today. EXAM:  PORTABLE CHEST 1 VIEW COMPARISON:  02/14/2019 and prior radiographs FINDINGS: Increased LEFT basilar opacity noted with equivocal trace LEFT pleural effusion. Cardiomegaly again noted. The RIGHT lung is clear. No pneumothorax or acute bony abnormality. IMPRESSION: Increased LEFT basilar opacity which may represent atelectasis and/or airspace disease/pneumonia. Equivocal trace LEFT pleural effusion. Cardiomegaly. Electronically Signed   By: Margarette Canada M.D.   On: 02/24/2019 10:20    Procedures Procedures (including critical care time)  Medications Ordered in ED Medications  azithromycin (ZITHROMAX) tablet 500 mg (has no administration in time range)  aspirin chewable tablet 243 mg (243 mg Oral Given 02/24/19 1213)  morphine 4 MG/ML injection 4 mg (4 mg  Intravenous Given 02/24/19 1406)  ondansetron (ZOFRAN) injection 4 mg (4 mg Intravenous Given 02/24/19 1405)     Initial Impression / Assessment and Plan / ED Course  I have reviewed the triage vital signs and the nursing notes.  Pertinent labs & imaging results that were available during my care of the patient were reviewed by me and considered in my medical decision making (see chart for details).        Patient with return of her pleuritic-like chest pain with recent work-up here which was negative for pulmonary embolism or ACS.  She was responding to prednisone which she was prescribed by her PCP 5 days ago, until return of symptoms this morning.  Her delta troponins today are negative.  She has nonspecific T wave abnormalities in her lateral leads.  Chest x-ray with an increased left basilar opacity, questionable atelectasis versus pneumonia.  Patient has no fevers or shortness of breath.  Discussed with Dr. Bronson Ing with recommendations of increasing and lengthening her prednisone taper, pt may have component of pleurisy and/or pericarditis.    Upon discussing plan with pt, it came to light that she took her initial prednisone course incorrectly - instead of 40 mg qd x 3 days, then 30 mg x 3 day,  She took 40 mg TID (first dose the 9th), then 30 mg TID, ran out of her prednisone this am.  This would explain why she obtained initial improvement, now worse.  Longer prednisone taper prescribed, hydrocodone prescribed.  Chest x-ray suggests left lower atelectasis versus infiltrate.  She does not have fevers, really denies shortness of breath, but will cover for this possibility with Zithromax.  Her BNP is elevated but this is stable with prior measurements and suspected to be secondary to her ESRD.  No clinical findings of CHF.  [place] narc database reviewed.  Plan f/u with pcp as needed, return precautions also discussed. Pt has dialysis tomorrow.   Final Clinical Impressions(s) / ED Diagnoses    Final diagnoses:  Atypical chest pain  Community acquired pneumonia of left lower lobe of lung [facility])    ED Discharge Orders         Ordered    predniSONE (DELTASONE) 10 MG tablet     02/24/19 1427    HYDROcodone-acetaminophen (NORCO/VICODIN) 5-325 MG tablet  Every 4 hours PRN     02/24/19 1427    azithromycin (ZITHROMAX) 250 MG tablet  Daily     02/24/19 1433           Evalee Jefferson, PA-C 02/24/19 [place], [place], DO 02/25/19 1706

## 2019-02-24 NOTE — Discharge Instructions (Addendum)
You are being prescribed a prednisone taper once again, however be sure that you follow the instructions for this medication so you do not run off of this medication too early.  You need to take 40 mg 1 time daily for 5 days, then 30 mg 1 time daily for 5 days, then continue the taper as the label instructions.  Your chest x-ray suggests a possible infection, I think this is less likely but you are being covered with antibiotics.  You were given your dose of Zithromax here today, take your next prescription dose of Zithromax tomorrow morning.  Do not drive within 4 hours of taking hydrocodone as this medication will make you drowsy.  Get rechecked for any worsening symptoms as discussed.

## 2019-02-25 DIAGNOSIS — E1129 Type 2 diabetes mellitus with other diabetic kidney complication: Secondary | ICD-10-CM | POA: Diagnosis not present

## 2019-02-25 DIAGNOSIS — N2581 Secondary hyperparathyroidism of renal origin: Secondary | ICD-10-CM | POA: Diagnosis not present

## 2019-02-25 DIAGNOSIS — E119 Type 2 diabetes mellitus without complications: Secondary | ICD-10-CM | POA: Diagnosis not present

## 2019-02-25 DIAGNOSIS — N186 End stage renal disease: Secondary | ICD-10-CM | POA: Diagnosis not present

## 2019-02-25 DIAGNOSIS — D631 Anemia in chronic kidney disease: Secondary | ICD-10-CM | POA: Diagnosis not present

## 2019-02-27 ENCOUNTER — Encounter: Payer: Self-pay | Admitting: Student

## 2019-02-27 ENCOUNTER — Ambulatory Visit (INDEPENDENT_AMBULATORY_CARE_PROVIDER_SITE_OTHER): Payer: Medicare Other | Admitting: Student

## 2019-02-27 ENCOUNTER — Other Ambulatory Visit: Payer: Self-pay

## 2019-02-27 VITALS — BP 180/90 | HR 84 | Temp 98.7°F | Ht 62.0 in | Wt 165.0 lb

## 2019-02-27 DIAGNOSIS — E039 Hypothyroidism, unspecified: Secondary | ICD-10-CM | POA: Diagnosis not present

## 2019-02-27 DIAGNOSIS — N186 End stage renal disease: Secondary | ICD-10-CM | POA: Diagnosis not present

## 2019-02-27 DIAGNOSIS — N2581 Secondary hyperparathyroidism of renal origin: Secondary | ICD-10-CM | POA: Diagnosis not present

## 2019-02-27 DIAGNOSIS — R079 Chest pain, unspecified: Secondary | ICD-10-CM | POA: Diagnosis not present

## 2019-02-27 DIAGNOSIS — R0609 Other forms of dyspnea: Secondary | ICD-10-CM | POA: Diagnosis not present

## 2019-02-27 DIAGNOSIS — E782 Mixed hyperlipidemia: Secondary | ICD-10-CM | POA: Diagnosis not present

## 2019-02-27 DIAGNOSIS — I10 Essential (primary) hypertension: Secondary | ICD-10-CM

## 2019-02-27 DIAGNOSIS — R0600 Dyspnea, unspecified: Secondary | ICD-10-CM

## 2019-02-27 DIAGNOSIS — D631 Anemia in chronic kidney disease: Secondary | ICD-10-CM | POA: Diagnosis not present

## 2019-02-27 DIAGNOSIS — E1129 Type 2 diabetes mellitus with other diabetic kidney complication: Secondary | ICD-10-CM | POA: Diagnosis not present

## 2019-02-27 DIAGNOSIS — E119 Type 2 diabetes mellitus without complications: Secondary | ICD-10-CM | POA: Diagnosis not present

## 2019-02-27 MED ORDER — NITROGLYCERIN 0.4 MG SL SUBL: 0.4000 mg | SUBLINGUAL_TABLET | SUBLINGUAL | 3 refills | Status: DC | PRN

## 2019-02-27 MED ORDER — AMLODIPINE BESYLATE 5 MG PO TABS: 5.0000 mg | ORAL_TABLET | Freq: Every day | ORAL | 3 refills | Status: DC

## 2019-02-27 NOTE — Patient Instructions (Signed)
Medication Instructions:  START AMLODIPINE 5 MG DAILY   SUBLINGUAL NITROGLYCERIN - INSTRUCTION SHEET GIVEN   Labwork: BMET   Testing/Procedures: Your physician has requested that you have cardiac CT. Cardiac computed tomography (CT) is a painless test that uses an x-ray machine to take clear, detailed pictures of your heart. For further information please visit [email or web address]. Please follow instruction sheet as given.      Follow-Up: Your physician recommends that you schedule a follow-up appointment in: 2-3 MONTHS   Any Other Special Instructions Will Be Listed Below (If Applicable).     If you need a refill on your cardiac medications before your next appointment, please call your pharmacy.

## 2019-02-27 NOTE — Progress Notes (Signed)
Cardiology Office Note    Date:  02/27/2019   ID:  Megan [name], DOB 03-22-1961, MRN [id number]  PCP:  [name], MD  Cardiologist: Carlyle Dolly, MD    Chief Complaint  Patient presents with   Hospitalization Follow-up    History of Present Illness:    Megan [name] is a 58 y.o. female with past medical history of HTN, HLD, ESRD, and breast cancer (s/p R mastectomy in 09/2018) who presents to the office today for hospital follow-up.  She was last examined by Dr. [name] in 04/2017 for cardiac clearance as part of her preoperative evaluation for consideration of a kidney transplant. She denied any recent chest pain or dyspnea on exertion but was not overly active at baseline. An echocardiogram was obtained and showed a preserved EF of 60-65% and a Lexiscan Myoview was performed and showed no evidence of ischemia.  She has not been evaluated by Cardiology since.  She was most recently admitted to [facility] from 02/02/2019 to 02/04/2019 for evaluation of worsening dyspnea thought to be secondary to acute volume overload. She was anemic on admission and received 1 unit pRBC's. Troponin values remained flat and peaked at 0.11. A repeat echocardiogram was performed and showed a preserved EF of 55 to 60% with no regional wall motion abnormalities.  Since hospital discharge, she did present to [facility] ED on 02/14/2019 for worsening dyspnea and right-sided chest discomfort. Her discomfort was worse with inspiration and thought to be more pleuritic. Initial and delta HS Troponin values were flat at 25.00 and 23.00. CTA showed no evidence of a PE but she did have a noted pulmonary nodule with repeat imaging recommended in 6-12 months. She had a recurrent episode on 02/24/2019 and her symptoms were again worse with inspiration. Troponin values were negaitve wirh BNP elevated to > 4500. She had previously been prescribed Prednisone for likely pleurisy but did not utilize her  Prednisone taper correctly. This was again prescribed with cardiology follow-up recommended.  In talking with the patient today, she reports having episodes of shooting pain for the past 1-2 months. Can occur at rest or with activity. Sometimes triggered by inspiration. Episodes can last from a few seconds up to 10 minutes. Notes mentioned she experienced improvement with steroids but she tells me today this did not help. She also reports dyspnea on exertion and gets short of breath when walking from room to room in her home. Unsure of her baseline dry weight at HD but says she has been able to complete all of her sessions. BP has been elevated during HD and is at 180/90 during today's visit. Denies any specific orthopnea, PND, edema, or palpitations.   Past Medical History:  Diagnosis Date   Anemia    Blood transfusion without reported diagnosis    Cataract    CKD (chronic kidney disease) stage 3, GFR 30-59 ml/min (HCC)    ESRD (end stage renal disease) on dialysis ([place])    on HD T, T, S   Essential hypertension, benign    GERD (gastroesophageal reflux disease)    Headache    Hemorrhoids    Mixed hyperlipidemia    PONV (postoperative nausea and vomiting)    Renal insufficiency    S/P colonoscopy Jan 2011   Dr. Benson Norway: sessile polyp (benign lymphoid), large hemorrhoids, repeat 5-10 years   Temporal arteritis ([place])    Type 2 diabetes mellitus ([place])    Wears glasses     Past Surgical History:  Procedure Laterality Date   ABDOMINAL HYSTERECTOMY     APPENDECTOMY     ARTERY BIOPSY N/A 05/09/2018   Procedure: RIGHT TEMPORAL ARTERY BIOPSY;  Surgeon: Judeth Horn, MD;  Location: [place];  Service: General;  Laterality: N/A;   CATARACT EXTRACTION W/PHACO Left 02/09/2017   Procedure: CATARACT EXTRACTION PHACO AND INTRAOCULAR LENS PLACEMENT LEFT EYE;  Surgeon: Tonny Branch, MD;  Location: [facility];  Service: Ophthalmology;  Laterality: Left;  CDE: 4.89   CATARACT EXTRACTION W/PHACO  Right 06/04/2017   Procedure: CATARACT EXTRACTION PHACO AND INTRAOCULAR LENS PLACEMENT (IOC);  Surgeon: Tonny Branch, MD;  Location: [facility];  Service: Ophthalmology;  Laterality: Right;  CDE: 4.12   CHOLECYSTECTOMY  09/29/2011   Procedure: LAPAROSCOPIC CHOLECYSTECTOMY;  Surgeon: Jamesetta So, MD;  Location: [facility];  Service: General;  Laterality: N/A;   COLONOSCOPY  Jan 2011   Dr. Benson Norway: sessile polyp (benign lymphoid), large hemorrhoids, repeat 5-10 years   COLONOSCOPY N/A 06/12/2016   Procedure: COLONOSCOPY;  Surgeon: Daneil Dolin, MD;  [facility];  Service: Endoscopy;  Laterality: N/A;  1230    ESOPHAGOGASTRODUODENOSCOPY  09/05/2011   ZOX:WRUEA hiatal hernia; remainder of exam normal. No explanation for patient's abdominal pain with today's examination   ESOPHAGOGASTRODUODENOSCOPY N/A 12/17/2013   Dr. [name]: gastric erythema, erosion, mild chronic inflammation on path    LAPAROSCOPIC APPENDECTOMY  09/29/2011   Procedure: APPENDECTOMY LAPAROSCOPIC;  Surgeon: Jamesetta So, MD;  Location: [facility];  Service: General;;  incidental appendectomy   PARTIAL MASTECTOMY WITH NEEDLE LOCALIZATION AND AXILLARY SENTINEL LYMPH NODE BX Right 09/18/2018   Procedure: RIGHT PARTIAL MASTECTOMY AFTER NEEDLE LOCALIZATION, SENTINEL LYMPH NODE BIOPSY RIGHT AXILLA;  Surgeon: Aviva Signs, MD;  Location: [facility];  Service: General;  Laterality: Right;    Current Medications: Outpatient Medications Prior to Visit  Medication Sig Dispense Refill   anastrozole (ARIMIDEX) 1 MG tablet Take 1 tablet (1 mg total) by mouth daily. 30 tablet 4   aspirin EC 81 MG tablet Take 81 mg by mouth daily.     atorvastatin (LIPITOR) 20 MG tablet TAKE 1 TABLET DAILY 90 tablet 0   azithromycin (ZITHROMAX) 250 MG tablet Take 1 tablet (250 mg total) by mouth daily. 4 tablet 0   B Complex-C-Folic Acid (DIALYVITE 540) 0.8 MG TABS Take 800 mg by mouth daily.      butalbital-acetaminophen-caffeine (FIORICET, ESGIC)  50-325-40 MG tablet Take 1 tablet by mouth every 6 (six) hours as needed for headache. 20 tablet 0   Calcium Carb-Cholecalciferol (CALCIUM 1000 + D PO) Take 1 tablet by mouth daily.      cinacalcet (SENSIPAR) 30 MG tablet Take 2 tablets (60 mg total) by mouth daily. 90 tablet 0   DULoxetine (CYMBALTA) 30 MG capsule TAKE 1 CAPSULE(30 MG) BY MOUTH DAILY 90 capsule 3   fluconazole (DIFLUCAN) 150 MG tablet Take 1 tablet by mouth daily.     gabapentin (NEURONTIN) 300 MG capsule TAKE 1 CAPSULE(300 MG) BY MOUTH AT BEDTIME 30 capsule 3   HYDROcodone-acetaminophen (NORCO/VICODIN) 5-325 MG tablet Take 1 tablet by mouth every 4 (four) hours as needed. 15 tablet 0   Investigational vitamin D 600 UNITS capsule SWOG S0812 Take 600 Units by mouth daily. Take with food.     lidocaine-prilocaine (EMLA) cream APPLY SMALL AMOUNT TO ACCESS SITE (AVF) 1 TO 2 HOURS BEFORE DIALYSIS. COVER WITH OCCLUSIVE DRESSING (SARAN WRAP)     losartan (COZAAR) 50 MG tablet Take 1 tablet (50 mg total) by mouth daily.  90 tablet 3   nebivolol (BYSTOLIC) 10 MG tablet Take 1 tablet (10 mg total) by mouth 2 (two) times daily. (Patient taking differently: Take 10 mg by mouth 3 (three) times a week. Take on the days of dialysis Tuesdays, Thursdays, and Saturdays) 180 tablet 3   ondansetron (ZOFRAN) 4 MG tablet Take 1 tablet (4 mg total) by mouth every 8 (eight) hours as needed for nausea or vomiting. 20 tablet 0   pantoprazole (PROTONIX) 40 MG tablet TAKE 1 TABLET(40 MG) BY MOUTH DAILY 90 tablet 3   predniSONE (DELTASONE) 10 MG tablet Take 4 tablets daily for 5 days, then 3 tablets daily for 5 days, 2 tablets daily for 3 days, 1 tablet daily for 3 days then 1/2 tablet daily for 3 days. 46 tablet 0   prochlorperazine (COMPAZINE) 10 MG tablet Take 1 tablet (10 mg total) by mouth every 6 (six) hours as needed (Nausea or vomiting). 30 tablet 1   tiZANidine (ZANAFLEX) 4 MG capsule TAKE 1 CAPSULE(4 MG) BY MOUTH TWICE DAILY WITH A MEAL.  START 1 CAPSULE AT NIGHT FOR 2 WEEKS AND THEN TWICE DAILY 60 capsule 0   amLODipine (NORVASC) 5 MG tablet Take 1 tablet (5 mg total) by mouth daily. (Patient taking differently: Take 5 mg by mouth daily as needed (blood pressure 159 or above). ) 90 tablet 3   No facility-administered medications prior to visit.      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[employer] [place] resource strain: Not hard at all   Food insecurity    Worry: Never true    Inability: Never true   Transportation needs    Medical: No    Non-medical: No  Tobacco Use   Smoking status: Never Smoker   Smokeless tobacco: Never Used  Substance and Sexual Activity   Alcohol use: No   Drug use: No   Sexual activity: Yes    Birth control/protection: Surgical  Lifestyle   Physical activity    Days per week: 0 days    Minutes per session: 0 min   Stress: Not at all  [place] on phone: Twice a week    Gets together: Twice a week    Attends religious service: More than 4 times per year    Active member of club or organization: No    Attends meetings of clubs or organizations: Never    Relationship status: Married  Other Topics Concern   Not on file  Social History Narrative   Works at [employer] in [place].    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   Also has to get items from high shelves-causes achy pain in shoulder area      Married.   Children are grown, out of house.     Family History:  The patient's family history includes Coronary artery disease in her mother and sister; Diabetes in her maternal aunt, maternal uncle, mother, paternal aunt, and paternal uncle;  Heart attack in her father, maternal aunt, maternal grandfather, maternal grandmother, maternal uncle, paternal aunt, paternal grandfather, paternal grandmother,  and paternal uncle; Hypertension in her brother, maternal aunt, maternal uncle, mother, paternal aunt, paternal uncle, sister, and son.   Review of Systems:   Please see the history of present illness.     General:  No chills, fever, night sweats or weight changes.  Cardiovascular:  No edema, orthopnea, palpitations, paroxysmal nocturnal dyspnea. Positive for chest pain and dyspnea on exertion.  Dermatological: No rash, lesions/masses Respiratory: No cough, dyspnea Urologic: No hematuria, dysuria Abdominal:   No nausea, vomiting, diarrhea, bright red blood per rectum, melena, or hematemesis Neurologic:  No visual changes, wkns, changes in mental status. All other systems reviewed and are otherwise negative except as noted above.   Physical Exam:    VS:  BP (!) 180/90    Pulse 84    Temp 98.7 F (37.1 C)    Ht 5\' 2"  (1.575 m)    Wt 165 lb (74.8 kg)    SpO2 95%    BMI 30.18 kg/m    General: Well developed, well nourished,female appearing in no acute distress. Head: Normocephalic, atraumatic, sclera non-icteric, no xanthomas, nares are without discharge.  Neck: No carotid bruits. JVD not elevated.  Lungs: Respirations regular and unlabored, decreased along bases bilaterally. Heart: Regular rate and rhythm. No S3 or S4.  No murmur, no rubs, or gallops appreciated. Abdomen: Soft, non-tender, non-distended with normoactive bowel sounds. No hepatomegaly. No rebound/guarding. No obvious abdominal masses. Msk:  Strength and tone appear normal for age. No joint deformities or effusions. Extremities: No clubbing or cyanosis. Trace ankle edema bilaterally.  Distal pedal pulses are 2+ bilaterally. Neuro: Alert and oriented X 3. Moves all extremities spontaneously. No focal deficits noted. Psych:  Responds to questions appropriately with a  normal affect. Skin: No rashes or lesions noted  Wt Readings from Last 3 Encounters:  02/27/19 165 lb (74.8 kg)  02/24/19 165 lb (74.8 kg)  02/20/19 164 lb (74.4 kg)     Studies/Labs Reviewed:   EKG:  EKG is ordered today.  The ekg ordered today demonstrates NSR, HR 82, with TWI along lateral leads. Similar to prior tracing earlier this month but new since earlier this year.  Recent Labs: 02/03/2019: TSH 1.837 02/24/2019: ALT 62; B Natriuretic Peptide >4,500.0; BUN 84; Creatinine, Ser 7.00; Hemoglobin 8.9; Platelets 537; Potassium 4.8; Sodium 138   Lipid Panel    Component Value Date/Time   CHOL 133 04/03/2017 1019   TRIG 133 04/03/2017 1019   HDL 41 (L) 04/03/2017 1019   CHOLHDL 3.2 04/03/2017 1019   VLDL 27 04/03/2017 1019   LDLCALC 65 04/03/2017 1019    Additional studies/ records that were reviewed today include:   NST: 05/2017  Electrically negative for ischemia  Myovue with normal perfusion  LVEF 52%  Low risk scan   NST: 08/2018  Nuclear stress EF: 59%.  There was no ST segment deviation noted during stress.  This is a low risk study.  The left ventricular ejection fraction is normal (55-65%).   Echocardiogram: 01/2019 IMPRESSIONS   1. The left ventricle has normal systolic function, with an ejection fraction of 55-60%. The cavity size was normal. There is mild concentric left ventricular hypertrophy. Left ventricular diastolic Doppler parameters are consistent with  pseudonormalization. Elevated left ventricular end-diastolic pressure.  2. The right ventricle has normal systolic function. The cavity was normal. There is no increase in right ventricular wall thickness.  3. The mitral valve is grossly normal. Mild thickening of the mitral valve leaflet.  4. Torn mitral valve chordae.  5. The tricuspid valve is grossly normal.  6. The aortic valve is grossly normal.  7. The aortic root is normal in size and structure.  8. The inferior vena cava was  dilated in size with >50% respiratory variability.    Assessment:    1. Chest pain, unspecified type   2. Dyspnea on exertion   3. Essential hypertension   4. Mixed hyperlipidemia   5. ESRD (end stage renal disease) ([place])      Plan:   In order of problems listed above:  1. Chest Pain/Dyspnea on Exertion - she reports 1-2 months of shooting chest pain which can occur at rest or with activity. Sometimes exacerbated with inspiration and can last from a few seconds up to 10 minutes. Has been treated for pleurisy with a steroid taper with no improvement in her symptoms. Troponin values during ED evaluations have been flat and recent echo last month showed a preserved EF of 55 to 60% with no regional wall motion abnormalities. No PE by recent CTA. She does have lateral TWI on her most recent EKG's and this has been noted on some tracings in the past but is now more prominent. She has undergone ischemic evaluation with a low-risk NST in 2018 and low-risk NST in 08/2018. Overall, her symptoms appear atypical but she continues to have progressive symptoms and she does have multiple cardiac risk factors. Reviewed options with the patient and will plan for a Coronary CT for further evaluation. Will provide with Rx for SL NTG in the interim.   2. HTN - BP elevated at 180/90 during today's visit. Listed as being on Amlodipine 5mg  daily, Losartan 50mg  daily, and Bystolic 10mg  BID but says she only takes Amlodipine on a PRN basis but has not utilized this lately. Given her elevated readings, I recommended taking Amlodipine 5mg  daily and following BP at home and during HD. Could be further titrated to 10mg  daily if BP remains above goal. Will defer continuation of ARB to Nephrology.   3. HLD - followed by PCP. She remains on Atorvastatin 20mg  daily.   4. ESRD - on HD - Tuesday, Thursday, Saturday schedule. Given her symptoms and recent CXR results (BNP also > 4500 with previous readings in the 2000's),  will reach out to make Nephrology aware as she is unsure of her dry weight and I am curious if this perhaps needs to be adjusted.    Medication Adjustments/Labs and Tests Ordered: Current medicines are reviewed at length with the patient today.  Concerns regarding medicines are outlined above.  Medication changes, Labs and Tests ordered today are listed in the Patient Instructions below. Patient Instructions  Medication Instructions:  START AMLODIPINE 5 MG DAILY   SUBLINGUAL NITROGLYCERIN - INSTRUCTION SHEET GIVEN   Labwork: BMET   Testing/Procedures: Your physician has requested that you have cardiac CT. Cardiac computed tomography (CT) is a painless test that uses an x-ray machine to take clear, detailed pictures of your heart. For further information please visit [facility]. Please follow instruction sheet as given.  Follow-Up: Your physician recommends that you schedule a follow-up appointment in: 2-3 MONTHS   Any Other Special Instructions Will Be Listed Below (If Applicable).  If you need a refill on your cardiac medications before your next appointment, please call your pharmacy.   Signed, Erma Heritage, PA-C  02/27/2019 7:34 PM    [facility] [address] [place], [place] [address] Phone: [phone] Fax: [phone]

## 2019-02-28 ENCOUNTER — Encounter (HOSPITAL_COMMUNITY)
Admission: EM | Disposition: A | Discharge status: Home / Self Care | Payer: Self-pay | Source: Home / Self Care | Attending: Emergency Medicine

## 2019-02-28 ENCOUNTER — Ambulatory Visit (HOSPITAL_COMMUNITY)
Admission: EM | Admit: 2019-02-28 | Discharge: 2019-03-01 | Disposition: A | Discharge status: Home / Self Care | Payer: Medicare Other | Attending: Interventional Cardiology | Admitting: Interventional Cardiology

## 2019-02-28 ENCOUNTER — Emergency Department (HOSPITAL_COMMUNITY): Payer: Medicare Other

## 2019-02-28 ENCOUNTER — Ambulatory Visit (HOSPITAL_COMMUNITY): Admit: 2019-02-28 | Payer: Medicare Other | Admitting: Interventional Cardiology

## 2019-02-28 ENCOUNTER — Other Ambulatory Visit: Payer: Self-pay

## 2019-02-28 ENCOUNTER — Encounter (HOSPITAL_COMMUNITY): Payer: Self-pay | Admitting: Emergency Medicine

## 2019-02-28 ENCOUNTER — Ambulatory Visit: Payer: Medicare Other | Admitting: Cardiovascular Disease

## 2019-02-28 DIAGNOSIS — Z9011 Acquired absence of right breast and nipple: Secondary | ICD-10-CM | POA: Insufficient documentation

## 2019-02-28 DIAGNOSIS — Z6830 Body mass index (BMI) 30.0-30.9, adult: Secondary | ICD-10-CM | POA: Insufficient documentation

## 2019-02-28 DIAGNOSIS — Z79899 Other long term (current) drug therapy: Secondary | ICD-10-CM | POA: Insufficient documentation

## 2019-02-28 DIAGNOSIS — Z90710 Acquired absence of both cervix and uterus: Secondary | ICD-10-CM | POA: Insufficient documentation

## 2019-02-28 DIAGNOSIS — E119 Type 2 diabetes mellitus without complications: Secondary | ICD-10-CM

## 2019-02-28 DIAGNOSIS — Z7982 Long term (current) use of aspirin: Secondary | ICD-10-CM | POA: Diagnosis not present

## 2019-02-28 DIAGNOSIS — N186 End stage renal disease: Secondary | ICD-10-CM | POA: Diagnosis not present

## 2019-02-28 DIAGNOSIS — I25110 Atherosclerotic heart disease of native coronary artery with unstable angina pectoris: Secondary | ICD-10-CM | POA: Insufficient documentation

## 2019-02-28 DIAGNOSIS — I120 Hypertensive chronic kidney disease with stage 5 chronic kidney disease or end stage renal disease: Secondary | ICD-10-CM | POA: Diagnosis not present

## 2019-02-28 DIAGNOSIS — K219 Gastro-esophageal reflux disease without esophagitis: Secondary | ICD-10-CM | POA: Diagnosis present

## 2019-02-28 DIAGNOSIS — Z1159 Encounter for screening for other viral diseases: Secondary | ICD-10-CM | POA: Diagnosis not present

## 2019-02-28 DIAGNOSIS — R079 Chest pain, unspecified: Secondary | ICD-10-CM | POA: Insufficient documentation

## 2019-02-28 DIAGNOSIS — Z885 Allergy status to narcotic agent status: Secondary | ICD-10-CM | POA: Insufficient documentation

## 2019-02-28 DIAGNOSIS — E1122 Type 2 diabetes mellitus with diabetic chronic kidney disease: Secondary | ICD-10-CM | POA: Insufficient documentation

## 2019-02-28 DIAGNOSIS — Z992 Dependence on renal dialysis: Secondary | ICD-10-CM | POA: Diagnosis not present

## 2019-02-28 DIAGNOSIS — R0789 Other chest pain: Secondary | ICD-10-CM | POA: Diagnosis present

## 2019-02-28 DIAGNOSIS — I249 Acute ischemic heart disease, unspecified: Secondary | ICD-10-CM

## 2019-02-28 DIAGNOSIS — Z853 Personal history of malignant neoplasm of breast: Secondary | ICD-10-CM | POA: Diagnosis not present

## 2019-02-28 DIAGNOSIS — I5030 Unspecified diastolic (congestive) heart failure: Secondary | ICD-10-CM

## 2019-02-28 DIAGNOSIS — I10 Essential (primary) hypertension: Secondary | ICD-10-CM | POA: Diagnosis present

## 2019-02-28 DIAGNOSIS — I132 Hypertensive heart and chronic kidney disease with heart failure and with stage 5 chronic kidney disease, or end stage renal disease: Secondary | ICD-10-CM | POA: Insufficient documentation

## 2019-02-28 DIAGNOSIS — E669 Obesity, unspecified: Secondary | ICD-10-CM | POA: Diagnosis present

## 2019-02-28 DIAGNOSIS — Z833 Family history of diabetes mellitus: Secondary | ICD-10-CM | POA: Insufficient documentation

## 2019-02-28 DIAGNOSIS — Z20828 Contact with and (suspected) exposure to other viral communicable diseases: Secondary | ICD-10-CM | POA: Diagnosis not present

## 2019-02-28 DIAGNOSIS — I5032 Chronic diastolic (congestive) heart failure: Secondary | ICD-10-CM | POA: Diagnosis not present

## 2019-02-28 DIAGNOSIS — R0602 Shortness of breath: Secondary | ICD-10-CM | POA: Diagnosis not present

## 2019-02-28 DIAGNOSIS — Z8249 Family history of ischemic heart disease and other diseases of the circulatory system: Secondary | ICD-10-CM | POA: Insufficient documentation

## 2019-02-28 DIAGNOSIS — I200 Unstable angina: Secondary | ICD-10-CM | POA: Diagnosis present

## 2019-02-28 DIAGNOSIS — E782 Mixed hyperlipidemia: Secondary | ICD-10-CM | POA: Diagnosis present

## 2019-02-28 DIAGNOSIS — I110 Hypertensive heart disease with heart failure: Secondary | ICD-10-CM

## 2019-02-28 DIAGNOSIS — E785 Hyperlipidemia, unspecified: Secondary | ICD-10-CM | POA: Diagnosis not present

## 2019-02-28 HISTORY — DX: Atherosclerotic heart disease of native coronary artery without angina pectoris: I25.10

## 2019-02-28 HISTORY — DX: Unspecified diastolic (congestive) heart failure: I50.30

## 2019-02-28 HISTORY — DX: Morbid (severe) obesity due to excess calories: E66.01

## 2019-02-28 HISTORY — DX: Other chest pain: R07.89

## 2019-02-28 HISTORY — PX: LEFT HEART CATH AND CORONARY ANGIOGRAPHY: CATH118249

## 2019-02-28 LAB — CBC
HCT: 32.9 % — ABNORMAL LOW (ref 36.0–46.0)
Hemoglobin: 9.8 g/dL — ABNORMAL LOW (ref 12.0–15.0)
MCH: 31.8 pg (ref 26.0–34.0)
MCHC: 29.8 g/dL — ABNORMAL LOW (ref 30.0–36.0)
MCV: 106.8 fL — ABNORMAL HIGH (ref 80.0–100.0)
Platelets: 472 10*3/uL — ABNORMAL HIGH (ref 150–400)
RBC: 3.08 MIL/uL — ABNORMAL LOW (ref 3.87–5.11)
RDW: 19.6 % — ABNORMAL HIGH (ref 11.5–15.5)
WBC: 11.8 10*3/uL — ABNORMAL HIGH (ref 4.0–10.5)
nRBC: 0.3 % — ABNORMAL HIGH (ref 0.0–0.2)

## 2019-02-28 LAB — HEPARIN LEVEL (UNFRACTIONATED): Heparin Unfractionated: <0.1 IU/mL — ABNORMAL LOW (ref 0.30–0.70)

## 2019-02-28 LAB — BASIC METABOLIC PANEL
Anion gap: 15 (ref 5–15)
BUN: 47 mg/dL — ABNORMAL HIGH (ref 6–20)
CO2: 28 mmol/L (ref 22–32)
Calcium: 8.9 mg/dL (ref 8.9–10.3)
Chloride: 96 mmol/L — ABNORMAL LOW (ref 98–111)
Creatinine, Ser: 5.7 mg/dL — ABNORMAL HIGH (ref 0.44–1.00)
GFR calc Af Amer: 9 mL/min — ABNORMAL LOW (ref >60)
GFR calc non Af Amer: 8 mL/min — ABNORMAL LOW (ref >60)
Glucose, Bld: 126 mg/dL — ABNORMAL HIGH (ref 70–99)
Potassium: 4.1 mmol/L (ref 3.5–5.1)
Sodium: 139 mmol/L (ref 135–145)

## 2019-02-28 LAB — GLUCOSE, CAPILLARY
Glucose-Capillary: 69 mg/dL — ABNORMAL LOW (ref 70–99)
Glucose-Capillary: 111 mg/dL — ABNORMAL HIGH (ref 70–99)

## 2019-02-28 LAB — TROPONIN I (HIGH SENSITIVITY)
Troponin I (High Sensitivity): 8.6 ng/L (ref <18)
Troponin I (High Sensitivity): 8 ng/L (ref <18)

## 2019-02-28 LAB — SARS CORONAVIRUS 2 BY RT PCR (HOSPITAL ORDER, PERFORMED IN ~~LOC~~ HOSPITAL LAB): SARS Coronavirus 2: NEGATIVE

## 2019-02-28 LAB — APTT: aPTT: 25 seconds (ref 24–36)

## 2019-02-28 SURGERY — LEFT HEART CATH AND CORONARY ANGIOGRAPHY
Anesthesia: LOCAL

## 2019-02-28 MED ORDER — ATORVASTATIN CALCIUM 10 MG PO TABS
20.0000 mg | ORAL_TABLET | Freq: Every day | ORAL | Status: DC
  Administered 2019-02-28: 20 mg via ORAL
  Filled 2019-02-28: qty 2

## 2019-02-28 MED ORDER — METOPROLOL TARTRATE 5 MG/5ML IV SOLN
INTRAVENOUS | Status: DC | PRN
  Administered 2019-02-28: 5 mg via INTRAVENOUS

## 2019-02-28 MED ORDER — ASPIRIN 81 MG PO CHEW
324.0000 mg | CHEWABLE_TABLET | Freq: Once | ORAL | Status: AC
  Administered 2019-02-28: 324 mg via ORAL
  Filled 2019-02-28: qty 4

## 2019-02-28 MED ORDER — TIZANIDINE HCL 4 MG PO TABS
4.0000 mg | ORAL_TABLET | Freq: Two times a day (BID) | ORAL | Status: DC | PRN
  Administered 2019-02-28: 4 mg via ORAL
  Filled 2019-02-28 (×3): qty 1

## 2019-02-28 MED ORDER — NITROGLYCERIN 0.4 MG SL SUBL: 0.4000 mg | SUBLINGUAL_TABLET | SUBLINGUAL | Status: DC | PRN

## 2019-02-28 MED ORDER — MIDAZOLAM HCL 2 MG/2ML IJ SOLN
INTRAMUSCULAR | Status: DC | PRN
  Administered 2019-02-28: 2 mg via INTRAVENOUS

## 2019-02-28 MED ORDER — PANTOPRAZOLE SODIUM 40 MG PO TBEC
40.0000 mg | DELAYED_RELEASE_TABLET | Freq: Every day | ORAL | Status: DC
  Administered 2019-02-28: 40 mg via ORAL
  Filled 2019-02-28: qty 1

## 2019-02-28 MED ORDER — FENTANYL CITRATE (PF) 100 MCG/2ML IJ SOLN
INTRAMUSCULAR | Status: AC
  Filled 2019-02-28: qty 2

## 2019-02-28 MED ORDER — AMLODIPINE BESYLATE 5 MG PO TABS: 5.0000 mg | ORAL_TABLET | Freq: Every day | ORAL | Status: DC

## 2019-02-28 MED ORDER — METOPROLOL TARTRATE 5 MG/5ML IV SOLN
INTRAVENOUS | Status: AC
  Filled 2019-02-28: qty 5

## 2019-02-28 MED ORDER — GABAPENTIN 300 MG PO CAPS
300.0000 mg | ORAL_CAPSULE | Freq: Every day | ORAL | Status: DC
  Administered 2019-02-28: 300 mg via ORAL
  Filled 2019-02-28: qty 1

## 2019-02-28 MED ORDER — HYDRALAZINE HCL 20 MG/ML IJ SOLN: 10.0000 mg | INTRAMUSCULAR | Status: AC | PRN

## 2019-02-28 MED ORDER — SODIUM CHLORIDE 0.9% FLUSH
3.0000 mL | Freq: Two times a day (BID) | INTRAVENOUS | Status: DC
  Administered 2019-02-28: 3 mL via INTRAVENOUS

## 2019-02-28 MED ORDER — CINACALCET HCL 30 MG PO TABS
30.0000 mg | ORAL_TABLET | Freq: Every day | ORAL | Status: DC
  Filled 2019-02-28: qty 1

## 2019-02-28 MED ORDER — IOHEXOL 350 MG/ML SOLN
INTRAVENOUS | Status: DC | PRN
  Administered 2019-02-28: 45 mL via INTRA_ARTERIAL

## 2019-02-28 MED ORDER — LOSARTAN POTASSIUM 50 MG PO TABS
50.0000 mg | ORAL_TABLET | Freq: Every day | ORAL | Status: DC
  Administered 2019-02-28: 50 mg via ORAL
  Filled 2019-02-28: qty 1

## 2019-02-28 MED ORDER — ANASTROZOLE 1 MG PO TABS
1.0000 mg | ORAL_TABLET | Freq: Every day | ORAL | Status: DC
  Filled 2019-02-28: qty 1

## 2019-02-28 MED ORDER — ACETAMINOPHEN 325 MG PO TABS: 650.0000 mg | ORAL_TABLET | ORAL | Status: DC | PRN

## 2019-02-28 MED ORDER — HEPARIN (PORCINE) IN NACL 1000-0.9 UT/500ML-% IV SOLN
INTRAVENOUS | Status: AC
  Filled 2019-02-28: qty 1000

## 2019-02-28 MED ORDER — MIDAZOLAM HCL 2 MG/2ML IJ SOLN
INTRAMUSCULAR | Status: AC
  Filled 2019-02-28: qty 2

## 2019-02-28 MED ORDER — FENTANYL CITRATE (PF) 100 MCG/2ML IJ SOLN
INTRAMUSCULAR | Status: DC | PRN
  Administered 2019-02-28: 25 ug via INTRAVENOUS

## 2019-02-28 MED ORDER — ONDANSETRON HCL 4 MG/2ML IJ SOLN: 4.0000 mg | Freq: Four times a day (QID) | INTRAMUSCULAR | Status: DC | PRN

## 2019-02-28 MED ORDER — HEPARIN (PORCINE) IN NACL 1000-0.9 UT/500ML-% IV SOLN
INTRAVENOUS | Status: DC | PRN
  Administered 2019-02-28: 500 mL

## 2019-02-28 MED ORDER — LABETALOL HCL 5 MG/ML IV SOLN: 10.0000 mg | INTRAVENOUS | Status: AC | PRN

## 2019-02-28 MED ORDER — SODIUM CHLORIDE 0.9 % IV SOLN: 250.0000 mL | INTRAVENOUS | Status: DC | PRN

## 2019-02-28 MED ORDER — LOSARTAN POTASSIUM 50 MG PO TABS: 50.0000 mg | ORAL_TABLET | Freq: Every day | ORAL | Status: DC

## 2019-02-28 MED ORDER — NEBIVOLOL HCL 5 MG PO TABS: 10.0000 mg | ORAL_TABLET | Freq: Every day | ORAL | Status: DC

## 2019-02-28 MED ORDER — LIDOCAINE HCL (PF) 1 % IJ SOLN
INTRAMUSCULAR | Status: DC | PRN
  Administered 2019-02-28: 20 mL

## 2019-02-28 MED ORDER — SODIUM CHLORIDE 0.9% FLUSH: 3.0000 mL | INTRAVENOUS | Status: DC | PRN

## 2019-02-28 MED ORDER — LIDOCAINE HCL (PF) 1 % IJ SOLN
INTRAMUSCULAR | Status: AC
  Filled 2019-02-28: qty 30

## 2019-02-28 MED ORDER — ASPIRIN EC 81 MG PO TBEC: 81.0000 mg | DELAYED_RELEASE_TABLET | Freq: Every day | ORAL | Status: DC

## 2019-02-28 MED ORDER — SODIUM CHLORIDE 0.9 % IV SOLN: INTRAVENOUS | Status: DC

## 2019-02-28 MED ORDER — HEPARIN (PORCINE) 25000 UT/250ML-% IV SOLN
800.0000 [IU]/h | INTRAVENOUS | Status: DC
  Administered 2019-02-28: 800 [IU]/h via INTRAVENOUS
  Filled 2019-02-28: qty 250

## 2019-02-28 MED ORDER — HEPARIN BOLUS VIA INFUSION
4000.0000 [IU] | Freq: Once | INTRAVENOUS | Status: AC
  Administered 2019-02-28: 4000 [IU] via INTRAVENOUS

## 2019-02-28 MED ORDER — SODIUM CHLORIDE 0.9 % IV SOLN
INTRAVENOUS | Status: AC | PRN
  Administered 2019-02-28: 10 mL/h via INTRAVENOUS

## 2019-02-28 SURGICAL SUPPLY — 11 items
BAG SNAP BAND KOVER 36X36 (MISCELLANEOUS) ×1 IMPLANT
CATH INFINITI 5FR MULTPACK ANG (CATHETERS) ×1 IMPLANT
CLOSURE MYNX CONTROL 5F (Vascular Products) ×1 IMPLANT
COVER DOME SNAP 22 D (MISCELLANEOUS) ×1 IMPLANT
KIT HEART LEFT (KITS) ×2 IMPLANT
PACK CARDIAC CATHETERIZATION (CUSTOM PROCEDURE TRAY) ×2 IMPLANT
SHEATH PINNACLE 5F 10CM (SHEATH) ×1 IMPLANT
SHEATH PROBE COVER 6X72 (BAG) ×1 IMPLANT
TRANSDUCER W/STOPCOCK (MISCELLANEOUS) ×2 IMPLANT
TUBING CIL FLEX 10 FLL-RA (TUBING) ×2 IMPLANT
WIRE EMERALD 3MM-J .035X150CM (WIRE) ×1 IMPLANT

## 2019-02-28 NOTE — ED Notes (Signed)
Pt unable to provide enough urine for preg test at this time

## 2019-02-28 NOTE — ED Triage Notes (Signed)
CP in center of chest that started at 0600. Took nitro x 2 with no relief.  This had been an ongoing problem x 2 months.  States no one can figure out what is going on.

## 2019-02-28 NOTE — Interval H&P Note (Signed)
Cath Lab Visit (complete for each Cath Lab visit)  Clinical Evaluation Leading to the Procedure:   ACS: Yes.    Non-ACS:    Anginal Classification: CCS IV  Anti-ischemic medical therapy: Minimal Therapy (1 class of medications)  Non-Invasive Test Results: No non-invasive testing performed  Prior CABG: No previous CABG      History and Physical Interval Note:  02/28/2019 2:16 PM  Megan [name]  has presented today for surgery, with the diagnosis of Chest pain.  The various methods of treatment have been discussed with the patient and family. After consideration of risks, benefits and other options for treatment, the patient has consented to  Procedure(s): LEFT HEART CATH AND CORONARY ANGIOGRAPHY (N/A) as a surgical intervention.  The patient's history has been reviewed, patient examined, no change in status, stable for surgery.  I have reviewed the patient's chart and labs.  Questions were answered to the patient's satisfaction.     Larae Grooms

## 2019-02-28 NOTE — ED Notes (Signed)
Called and paged cardiology. No answer.

## 2019-02-28 NOTE — Progress Notes (Signed)
ANTICOAGULATION CONSULT NOTE - Initial Consult  Pharmacy Consult for heparin dosing Indication: ACS/STEMI  Allergies  Allergen Reactions  . Hydrocodone     itching    Patient Measurements: Height: 5\' 2"  (157.5 cm) Weight: 165 lb (74.8 kg) IBW/kg (Calculated) : 50.1 Heparin Dosing Weight: HEPARIN DW (KG): 66.3  Vital Signs: Temp: 97.5 F (36.4 C) (07/17 0830) Temp Source: Oral (07/17 0830) BP: 172/99 (07/17 1030) Pulse Rate: 77 (07/17 1045)  Labs: Recent Labs    02/28/19 0846  HGB 9.8*  HCT 32.9*  PLT 472*  CREATININE 5.70*  TROPONINIHS 8.6    Estimated Creatinine Clearance: 10.4 mL/min (A) (by C-G formula based on SCr of 5.7 mg/dL (H)).   Medical History: Past Medical History:  Diagnosis Date  . Anemia   . Blood transfusion without reported diagnosis   . Cataract   . CKD (chronic kidney disease) stage 3, GFR 30-59 ml/min (HCC)   . ESRD (end stage renal disease) on dialysis ([place])    on HD T, T, S  . Essential hypertension, benign   . GERD (gastroesophageal reflux disease)   . Headache   . Hemorrhoids   . Mixed hyperlipidemia   . PONV (postoperative nausea and vomiting)   . Renal insufficiency   . S/P colonoscopy Jan 2011   Dr. Benson Norway: sessile polyp (benign lymphoid), large hemorrhoids, repeat 5-10 years  . Temporal arteritis ([place])   . Type 2 diabetes mellitus ([place])   . Wears glasses       Assessment: Pharmacy consulted to dose heparin infusion for this 58 yo female with ESRD and  complaints of  shooting  chest pain and EKG changes.  Patient has not been on any prior anticoagulants.  Goal of Therapy:  Heparin level 0.3-0.7 units/ml Monitor platelets by anticoagulation protocol: Yes   Plan:  Give 4000 units bolus x 1 Start heparin infusion at 800 units/hr Check anti-Xa level in 6-8 hours and daily while on heparin Continue to monitor H&H and platelets   Despina Pole, Pharm. D. Clinical Pharmacist 02/28/2019 11:11 AM

## 2019-02-28 NOTE — ED Notes (Signed)
[facility] called and received report, ETA 20 min.

## 2019-02-28 NOTE — ED Provider Notes (Signed)
[facility] EMERGENCY DEPARTMENT Provider Note   CSN: [id number] Arrival date & time: 02/28/19  8016     History   Chief Complaint Chief Complaint  Patient presents with  . Chest Pain    HPI Megan [name] is a 58 y.o. female.     HPI  58 year old female, she has a known history of type 2 diabetes, hypertension, she has end-stage renal disease and is on dialysis Tuesdays Thursdays and Saturdays.  She also reports a history of breast cancer that was resected in February 2020 and for which she currently takes chemotherapy.  She reports that she had the onset of chest pain which started a couple of months ago and has been intermittent, some days she has it in some days she does not.  It tends to be more in the morning, it is a sharp and stabbing pain which seems to be mid to left side, it does have some association with shortness of breath.  She also endorses some coughing recently but no fevers or swelling of the legs.  She has had multiple tests in the past including an echocardiogram which was performed in June, she last saw her cardiologist in 2018 but did follow-up with the physician assistant at that office yesterday, a cardiac CT has been in process of trying to be arranged and she was given nitroglycerin and told to come to the ER if the nitroglycerin did not relieve her chest pain.  Her chest pain most recently started this morning, it is been ongoing for the last hour, it did not improve with 2 doses of nitroglycerin and thus it was recommended that she come to the emergency department.  She has had multiple evaluations for chest pain over the last couple of months including her admission in June during which time she had the echocardiogram showing a preserved ejection fraction of 55 to 60%.  Past Medical History:  Diagnosis Date  . Anemia   . Blood transfusion without reported diagnosis   . Cataract   . CKD (chronic kidney disease) stage 3, GFR 30-59 ml/min (HCC)   . ESRD  (end stage renal disease) on dialysis ([place])    on HD T, T, S  . Essential hypertension, benign   . GERD (gastroesophageal reflux disease)   . Headache   . Hemorrhoids   . Mixed hyperlipidemia   . PONV (postoperative nausea and vomiting)   . Renal insufficiency   . S/P colonoscopy Jan 2011   Dr. Benson Norway: sessile polyp (benign lymphoid), large hemorrhoids, repeat 5-10 years  . Temporal arteritis ([place])   . Type 2 diabetes mellitus ([place])   . Wears glasses     Patient Active Problem List   Diagnosis Date Noted  . Elevated troponin 02/03/2019  . ESRD (end stage renal disease) ([place]) 02/03/2019  . Acute diastolic CHF (congestive heart failure) ([place]) 02/03/2019  . Hypokalemia 02/03/2019  . Acute respiratory failure with hypoxia ([place]) 02/03/2019  . 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[place])   . Rectal bleeding 06/08/2016  . Vaginal discharge 12/30/2015  . Abdominal pain, epigastric 11/26/2013  . GERD (gastroesophageal reflux disease)   . Type 2 diabetes mellitus ([place])   . CKD (chronic kidney disease) stage 3, GFR 30-59 ml/min (HCC)   . External hemorrhoid 08/01/2011  . Hematochezia 07/13/2011  . Dyspnea 04/21/2010  . Atypical chest pain 04/21/2010  . Mixed hyperlipidemia 03/11/2008  . OBESITY 03/11/2008  . Essential hypertension, benign 03/11/2008    Past  Surgical History:  Procedure Laterality Date  . ABDOMINAL HYSTERECTOMY    . APPENDECTOMY    . ARTERY BIOPSY N/A 05/09/2018   Procedure: RIGHT TEMPORAL ARTERY BIOPSY;  Surgeon: Judeth Horn, MD;  Location: [place];  Service: General;  Laterality: N/A;  . CATARACT EXTRACTION W/PHACO Left 02/09/2017   Procedure: CATARACT EXTRACTION PHACO AND INTRAOCULAR LENS PLACEMENT LEFT EYE;  Surgeon: Tonny Branch, MD;  Location: [facility];  Service: Ophthalmology;  Laterality: Left;  CDE: 4.89  . CATARACT EXTRACTION W/PHACO Right 06/04/2017   Procedure: CATARACT EXTRACTION PHACO AND INTRAOCULAR LENS PLACEMENT (IOC);  Surgeon:  Tonny Branch, MD;  Location: [facility];  Service: Ophthalmology;  Laterality: Right;  CDE: 4.12  . CHOLECYSTECTOMY  09/29/2011   Procedure: LAPAROSCOPIC CHOLECYSTECTOMY;  Surgeon: Jamesetta So, MD;  Location: [facility];  Service: General;  Laterality: N/A;  . COLONOSCOPY  Jan 2011   Dr. Benson Norway: sessile polyp (benign lymphoid), large hemorrhoids, repeat 5-10 years  . COLONOSCOPY N/A 06/12/2016   Procedure: COLONOSCOPY;  Surgeon: Daneil Dolin, MD;  [facility];  Service: Endoscopy;  Laterality: N/A;  1230   . ESOPHAGOGASTRODUODENOSCOPY  09/05/2011   BHA:LPFXT hiatal hernia; remainder of exam normal. No explanation for patient's abdominal pain with today's examination  . ESOPHAGOGASTRODUODENOSCOPY N/A 12/17/2013   Dr. [name]: gastric erythema, erosion, mild chronic inflammation on path   . LAPAROSCOPIC APPENDECTOMY  09/29/2011   Procedure: APPENDECTOMY LAPAROSCOPIC;  Surgeon: Jamesetta So, MD;  Location: [facility];  Service: General;;  incidental appendectomy  . PARTIAL MASTECTOMY WITH NEEDLE LOCALIZATION AND AXILLARY SENTINEL LYMPH NODE BX Right 09/18/2018   Procedure: RIGHT PARTIAL MASTECTOMY AFTER NEEDLE LOCALIZATION, SENTINEL LYMPH NODE BIOPSY RIGHT AXILLA;  Surgeon: Aviva Signs, MD;  Location: [facility];  Service: General;  Laterality: Right;     OB History    Gravida      Para      Term      Preterm      AB      Living  1     SAB      TAB      Ectopic      Multiple      Live Births               Home Medications    Prior to Admission medications   Medication Sig Start Date End Date Taking? Authorizing Provider  amLODipine (NORVASC) 5 MG tablet Take 1 tablet (5 mg total) by mouth daily. 02/27/19   Strader, Fransisco Hertz, PA-C  anastrozole (ARIMIDEX) 1 MG tablet Take 1 tablet (1 mg total) by mouth daily. 01/29/19   Derek Jack, MD  aspirin EC 81 MG tablet Take 81 mg by mouth daily.    [provider]  atorvastatin (LIPITOR) 20 MG tablet TAKE 1 TABLET  DAILY 02/24/19   [name], MD  azithromycin (ZITHROMAX) 250 MG tablet Take 1 tablet (250 mg total) by mouth daily. 02/24/19   Idol, Almyra Free, PA-C  B Complex-C-Folic Acid (DIALYVITE 024) 0.8 MG TABS Take 800 mg by mouth daily.  01/22/18   [provider]  butalbital-acetaminophen-caffeine (FIORICET, ESGIC) 50-325-40 MG tablet Take 1 tablet by mouth every 6 (six) hours as needed for headache. 06/04/18 06/04/19  [name], MD  Calcium Carb-Cholecalciferol (CALCIUM 1000 + D PO) Take 1 tablet by mouth daily.     [provider]  cinacalcet (SENSIPAR) 30 MG tablet Take 2 tablets (60 mg total) by mouth daily. 06/13/18  [name], MD  DULoxetine (CYMBALTA) 30 MG capsule TAKE 1 CAPSULE(30 MG) BY MOUTH DAILY 02/10/19   [name], MD  fluconazole (DIFLUCAN) 150 MG tablet Take 1 tablet by mouth daily. 02/08/19   [provider]  gabapentin (NEURONTIN) 300 MG capsule TAKE 1 CAPSULE(300 MG) BY MOUTH AT BEDTIME 02/17/19   [name], MD  HYDROcodone-acetaminophen (NORCO/VICODIN) 5-325 MG tablet Take 1 tablet by mouth every 4 (four) hours as needed. 02/24/19   Evalee Jefferson, PA-C  Investigational vitamin D 600 UNITS capsule SWOG [id number] Take 600 Units by mouth daily. Take with food.    [provider]  lidocaine-prilocaine (EMLA) cream APPLY SMALL AMOUNT TO ACCESS SITE (AVF) 1 TO 2 HOURS BEFORE DIALYSIS. COVER WITH OCCLUSIVE DRESSING (SARAN WRAP) 09/24/18   [provider]  losartan (COZAAR) 50 MG tablet Take 1 tablet (50 mg total) by mouth daily. 03/28/18   [name], MD  nebivolol (BYSTOLIC) 10 MG tablet Take 1 tablet (10 mg total) by mouth 2 (two) times daily. Patient taking differently: Take 10 mg by mouth 3 (three) times a week. Take on the days of dialysis Tuesdays, Thursdays, and Saturdays 06/13/18   [name], MD  nitroGLYCERIN (NITROSTAT) 0.4 MG SL tablet Place 1 tablet (0.4 mg total) under the tongue every 5 (five) minutes  as needed for chest pain. 02/27/19 05/28/19  Strader, Fransisco Hertz, PA-C  ondansetron (ZOFRAN) 4 MG tablet Take 1 tablet (4 mg total) by mouth every 8 (eight) hours as needed for nausea or vomiting. 09/18/18   Aviva Signs, MD  pantoprazole (PROTONIX) 40 MG tablet TAKE 1 TABLET(40 MG) BY MOUTH DAILY 08/19/18   [name], MD  predniSONE (DELTASONE) 10 MG tablet Take 4 tablets daily for 5 days, then 3 tablets daily for 5 days, 2 tablets daily for 3 days, 1 tablet daily for 3 days then 1/2 tablet daily for 3 days. 02/24/19   Evalee Jefferson, PA-C  prochlorperazine (COMPAZINE) 10 MG tablet Take 1 tablet (10 mg total) by mouth every 6 (six) hours as needed (Nausea or vomiting). 10/29/18   Derek Jack, MD  tiZANidine (ZANAFLEX) 4 MG capsule TAKE 1 CAPSULE(4 MG) BY MOUTH TWICE DAILY WITH A MEAL. START 1 CAPSULE AT NIGHT FOR 2 WEEKS AND THEN TWICE DAILY 01/14/19   Garvin Fila, MD    Family History Family History  Problem Relation Age of Onset  . Hypertension Mother   . Coronary artery disease Mother   . Diabetes Mother   . Hypertension Sister   . Coronary artery disease Sister   . Hypertension Brother   . Heart attack Father   . Hypertension Son   . Heart attack Maternal Aunt   . Hypertension Maternal Aunt   . Diabetes Maternal Aunt   . Heart attack Maternal Uncle   . Hypertension Maternal Uncle   . Diabetes Maternal Uncle   . Heart attack Paternal Aunt   . Hypertension Paternal Aunt   . Diabetes Paternal Aunt   . Heart attack Paternal Uncle   . Hypertension Paternal Uncle   . Diabetes Paternal Uncle   . Heart attack Maternal Grandmother   . Heart attack Maternal Grandfather   . Heart attack Paternal Grandmother   . Heart attack Paternal Grandfather   . Colon cancer Neg Hx     Social History Social History   Tobacco Use  . Smoking status: Never Smoker  . Smokeless tobacco: Never Used  Substance Use Topics  .  Alcohol use: No  . Drug use: No     Allergies   Hydrocodone    Review of Systems Review of Systems  All other systems reviewed and are negative.    Physical Exam Updated Vital Signs BP (!) 174/92 (BP Location: Right Arm)   Pulse 78   Temp (!) 97.5 F (36.4 C) (Oral)   Resp 20   Ht 1.575 m (5\' 2" )   Wt 74.8 kg   SpO2 99%   BMI 30.18 kg/m   Physical Exam Vitals signs and nursing note reviewed.  Constitutional:      General: She is not in acute distress.    Appearance: She is well-developed.  HENT:     Head: Normocephalic and atraumatic.     Mouth/Throat:     Pharynx: No oropharyngeal exudate.  Eyes:     General: No scleral icterus.       Right eye: No discharge.        Left eye: No discharge.     Conjunctiva/sclera: Conjunctivae normal.     Pupils: Pupils are equal, round, and reactive to light.  Neck:     Musculoskeletal: Normal range of motion and neck supple.     Thyroid: No thyromegaly.     Vascular: No JVD.  Cardiovascular:     Rate and Rhythm: Normal rate and regular rhythm.     Heart sounds: Normal heart sounds. No murmur. No friction rub. No gallop.   Pulmonary:     Effort: Pulmonary effort is normal. No respiratory distress.     Breath sounds: Rales ( L > R base) present. No wheezing.  Abdominal:     General: Bowel sounds are normal. There is no distension.     Palpations: Abdomen is soft. There is no mass.     Tenderness: There is no abdominal tenderness.  Musculoskeletal: Normal range of motion.        General: No tenderness.  Lymphadenopathy:     Cervical: No cervical adenopathy.  Skin:    General: Skin is warm and dry.     Findings: No erythema or rash.  Neurological:     Mental Status: She is alert.     Coordination: Coordination normal.  Psychiatric:        Behavior: Behavior normal.      ED Treatments / Results  Labs (all labs ordered are listed, but only abnormal results are displayed) Labs Reviewed  CBC - Abnormal; Notable for the following components:      Result Value   WBC 11.8 (*)     RBC 3.08 (*)    Hemoglobin 9.8 (*)    HCT 32.9 (*)    MCV 106.8 (*)    MCHC 29.8 (*)    RDW 19.6 (*)    Platelets 472 (*)    nRBC 0.3 (*)    All other components within normal limits  BASIC METABOLIC PANEL  POC URINE PREG, ED  TROPONIN I (HIGH SENSITIVITY)    EKG None  Radiology No results found.  Procedures .Critical Care Performed by: Noemi Chapel, MD Authorized by: Noemi Chapel, MD   Critical care provider statement:    Critical care time (minutes):  35   Critical care time was exclusive of:  Separately billable procedures and treating other patients and teaching time   Critical care was necessary to treat or prevent imminent or life-threatening deterioration of the following conditions:  Cardiac failure   Critical care was time spent personally by me on the  following activities:  Blood draw for specimens, development of treatment plan with patient or surrogate, discussions with consultants, evaluation of patient's response to treatment, examination of patient, obtaining history from patient or surrogate, ordering and performing treatments and interventions, ordering and review of laboratory studies, ordering and review of radiographic studies, pulse oximetry, re-evaluation of patient's condition and review of old charts   (including critical care time)  Medications Ordered in ED Medications - No data to display   Initial Impression / Assessment and Plan / ED Course  I have reviewed the triage vital signs and the nursing notes.  Pertinent labs & imaging results that were available during my care of the patient were reviewed by me and considered in my medical decision making (see chart for details).        The patient is rather well-appearing however she has multiple abnormal findings on her exam including some rales in the lungs as well as an abnormal EKG showing T wave inversions in leads I and aVL as well as V6 which are abnormal and not consistent with prior  EKGs.  Despite having a normal echocardiogram I think she will need a cardiac work-up and discussion with a cardiologist.  I would also consider pulmonary embolism given her recent diagnosis of breast cancer though she is not tachycardic or hypoxic or feeling short of breath at this time.  She did have a CT angiogram on July 3 according to my review of the medical record suggesting that her symptoms today are probably unlikely to be pulmonary embolism especially given no tachycardia or hypoxia.  She has no swelling or edema of the legs.  Discussed with the cardiologist, Dr. Johnsie Cancel, he will come to see the patient, consideration for catheterization.  Pt has been seen by  Cardiology - they will transfer for admission due to likely ACS / unstable angina - pt is critically ill and in need of acute evaluation and admission  To be transferred to [facility] for [facility]   Final Clinical Impressions(s) / ED Diagnoses   Final diagnoses:  Acute coronary syndrome ([place])  Unstable angina (HCC)     Noemi Chapel, MD 02/28/19 1257

## 2019-02-28 NOTE — H&P (Addendum)
History & Physical    Patient ID: Megan [name] MRN: [id number], DOB/AGE: 1961-05-31   Admit [name]: 02/28/2019  Primary Care Provider: [name], MD Primary Cardiologist: Carlyle Dolly, MD   Chief Complaint: Chest Pain  Patient Profile    Megan [name] is a 58 y.o. female with past medical history of HTN, HLD, ESRD, and breast cancer (s/p R mastectomy in 09/2018) who is being evaluated today for chest pain at the request of Dr. Sabra Heck.  History of Present Illness    Megan [name] has undergone multiple admissions and ED evaluations over the past few months for intermittent dyspnea and chest discomfort.  She was initially treated for pleuritis as her chest discomfort was worse with inspiration and she initially experienced improvement with a prednisone taper but symptoms re-presented. Troponin values were negative during her recent ED evaluation on 02/24/2019 and BNP was elevated to greater than 4500.  She had a follow-up visit with myself yesterday and reported episodes of shooting sternal chest discomfort for the past 1 to 2 months which could occur at rest or with activity and was triggered by inspiration. Symptoms could last for a few seconds up to 10 minutes. She did report worsening dyspnea on exertion as well and was unsure of her dry weight during HD.  She was provided with an Rx for sublingual nitroglycerin and a coronary CT was recommended for further evaluation given lateral TWI on her EKG. She did have  low risk NST's in 2018 and  08/2018.  In talking with the patient today, she reports feeling well overnight but this morning she developed a recurrent shooting chest discomfort along her sternal region. This has again been exacerbated with deep breathing and she tried taking 2 sublingual nitroglycerin with no improvement in her symptoms and presented to the ED as previously instructed.  Labs show WBC 11.8, Hgb 9.8, platelets 472, Na+ 139, K+ 4.1, and  creatinine 5.70. Initial HS Troponin negative with repeat pending. EKG shows NSR, HR 79, with LVH and TWI along lateral leads which is more prominent when compared to her prior tracings from yesterday and 7/13.  She still reports an 8/10 chest discomfort at this time. Reports pain has been constant since it started at 0600.    Past Medical History:  Diagnosis [name]   Anemia    Blood transfusion without reported diagnosis    Cataract    CKD (chronic kidney disease) stage 3, GFR 30-59 ml/min (HCC)    ESRD (end stage renal disease) on dialysis ([place])    on HD T, T, S   Essential hypertension, benign    GERD (gastroesophageal reflux disease)    Headache    Hemorrhoids    Mixed hyperlipidemia    PONV (postoperative nausea and vomiting)    Renal insufficiency    S/P colonoscopy Jan 2011   Dr. Benson Norway: sessile polyp (benign lymphoid), large hemorrhoids, repeat 5-10 years   Temporal arteritis ([place])    Type 2 diabetes mellitus ([place])    Wears glasses     Past Surgical History:  Procedure Laterality [name]   ABDOMINAL HYSTERECTOMY     APPENDECTOMY     ARTERY BIOPSY N/A 05/09/2018   Procedure: RIGHT TEMPORAL ARTERY BIOPSY;  Surgeon: Judeth Horn, MD;  Location: [place];  Service: General;  Laterality: N/A;   CATARACT EXTRACTION W/PHACO Left 02/09/2017   Procedure: CATARACT EXTRACTION PHACO AND INTRAOCULAR LENS PLACEMENT LEFT EYE;  Surgeon: Tonny Branch, MD;  Location: [facility];  Service: Ophthalmology;  Laterality: Left;  CDE: 4.89   CATARACT EXTRACTION W/PHACO Right 06/04/2017   Procedure: CATARACT EXTRACTION PHACO AND INTRAOCULAR LENS PLACEMENT (IOC);  Surgeon: Tonny [place], MD;  Location: [facility];  Service: Ophthalmology;  Laterality: Right;  CDE: 4.12   CHOLECYSTECTOMY  09/29/2011   Procedure: LAPAROSCOPIC CHOLECYSTECTOMY;  Surgeon: Jamesetta So, MD;  Location: [facility];  Service: General;  Laterality: N/A;   COLONOSCOPY  Jan 2011   Dr. Benson Norway: sessile polyp (benign lymphoid),  large hemorrhoids, repeat 5-10 years   COLONOSCOPY N/A 06/12/2016   Procedure: COLONOSCOPY;  Surgeon: Daneil Dolin, MD;  [facility];  Service: Endoscopy;  Laterality: N/A;  1230    ESOPHAGOGASTRODUODENOSCOPY  09/05/2011   STM:[id number] hiatal hernia; remainder of exam normal. No explanation for patient's abdominal pain with today's examination   ESOPHAGOGASTRODUODENOSCOPY N/A 12/17/2013   Dr. [name]: gastric erythema, erosion, mild chronic inflammation on path    LAPAROSCOPIC APPENDECTOMY  09/29/2011   Procedure: APPENDECTOMY LAPAROSCOPIC;  Surgeon: Jamesetta So, MD;  Location: [facility];  Service: General;;  incidental appendectomy   PARTIAL MASTECTOMY WITH NEEDLE LOCALIZATION AND AXILLARY SENTINEL LYMPH NODE BX Right 09/18/2018   Procedure: RIGHT PARTIAL MASTECTOMY AFTER NEEDLE LOCALIZATION, SENTINEL LYMPH NODE BIOPSY RIGHT AXILLA;  Surgeon: Aviva Signs, MD;  Location: [facility];  Service: General;  Laterality: Right;     Medications Prior to Admission: Prior to Admission medications   Medication Sig Start [name] End [name] Taking? Authorizing Provider  amLODipine (NORVASC) 5 MG tablet Take 1 tablet (5 mg total) by mouth daily. 02/27/19   Strader, Fransisco Hertz, PA-C  anastrozole (ARIMIDEX) 1 MG tablet Take 1 tablet (1 mg total) by mouth daily. 01/29/19   Derek Jack, MD  aspirin EC 81 MG tablet Take 81 mg by mouth daily.    [provider]  atorvastatin (LIPITOR) 20 MG tablet TAKE 1 TABLET DAILY 02/24/19   [name], MD  azithromycin (ZITHROMAX) 250 MG tablet Take 1 tablet (250 mg total) by mouth daily. 02/24/19   Idol, Almyra Free, PA-C  B Complex-C-Folic Acid (DIALYVITE 297) 0.8 MG TABS Take 800 mg by mouth daily.  01/22/18   [provider]  butalbital-acetaminophen-caffeine (FIORICET, ESGIC) 50-325-40 MG tablet Take 1 tablet by mouth every 6 (six) hours as needed for headache. 06/04/18 06/04/19  [name], MD  Calcium Carb-Cholecalciferol (CALCIUM 1000 +  D PO) Take 1 tablet by mouth daily.     [provider]  cinacalcet (SENSIPAR) 30 MG tablet Take 2 tablets (60 mg total) by mouth daily. 06/13/18   [name], MD  DULoxetine (CYMBALTA) 30 MG capsule TAKE 1 CAPSULE(30 MG) BY MOUTH DAILY 02/10/19   [name], MD  fluconazole (DIFLUCAN) 150 MG tablet Take 1 tablet by mouth daily. 02/08/19   [provider]  gabapentin (NEURONTIN) 300 MG capsule TAKE 1 CAPSULE(300 MG) BY MOUTH AT BEDTIME 02/17/19   [name], MD  HYDROcodone-acetaminophen (NORCO/VICODIN) 5-325 MG tablet Take 1 tablet by mouth every 4 (four) hours as needed. 02/24/19   Evalee Jefferson, PA-C  Investigational vitamin D 600 UNITS capsule SWOG [id number] Take 600 Units by mouth daily. Take with food.    [provider]  lidocaine-prilocaine (EMLA) cream APPLY SMALL AMOUNT TO ACCESS SITE (AVF) 1 TO 2 HOURS BEFORE DIALYSIS. COVER WITH OCCLUSIVE DRESSING (SARAN WRAP) 09/24/18   [provider]  losartan (COZAAR) 50 MG tablet Take 1 tablet (50 mg total) by mouth daily. 03/28/18  [name], MD  nebivolol (BYSTOLIC) 10 MG tablet Take 1 tablet (10 mg total) by mouth 2 (two) times daily. Patient taking differently: Take 10 mg by mouth 3 (three) times a week. Take on the days of dialysis Tuesdays, Thursdays, and Saturdays 06/13/18   [name], MD  nitroGLYCERIN (NITROSTAT) 0.4 MG SL tablet Place 1 tablet (0.4 mg total) under the tongue every 5 (five) minutes as needed for chest pain. 02/27/19 05/28/19  Strader, Fransisco Hertz, PA-C  ondansetron (ZOFRAN) 4 MG tablet Take 1 tablet (4 mg total) by mouth every 8 (eight) hours as needed for nausea or vomiting. 09/18/18   Aviva Signs, MD  pantoprazole (PROTONIX) 40 MG tablet TAKE 1 TABLET(40 MG) BY MOUTH DAILY 08/19/18   [name], MD  predniSONE (DELTASONE) 10 MG tablet Take 4 tablets daily for 5 days, then 3 tablets daily for 5 days, 2 tablets daily for 3 days, 1 tablet daily for 3 days then 1/2  tablet daily for 3 days. 02/24/19   Evalee Jefferson, PA-C  prochlorperazine (COMPAZINE) 10 MG tablet Take 1 tablet (10 mg total) by mouth every 6 (six) hours as needed (Nausea or vomiting). 10/29/18   Derek Jack, MD  tiZANidine (ZANAFLEX) 4 MG capsule TAKE 1 CAPSULE(4 MG) BY MOUTH TWICE DAILY WITH A MEAL. 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[place] # [place] resource strain: Not hard at all   Food insecurity    Worry: Never true    Inability: Never true   Transportation needs    Medical: No    Non-medical: No  Tobacco Use   Smoking status: Never Smoker   Smokeless tobacco: Never Used  Substance and Sexual Activity   Alcohol use: No   Drug use: No   Sexual activity: Yes    Birth control/protection: Surgical  Lifestyle   Physical activity    Days per week: 0 days    Minutes per session: 0 min   Stress: Not at all  [place] on phone: Twice a week    Gets together: Twice a week    Attends religious service: More than 4 times per year    Active member of club or organization: No    Attends meetings of clubs or organizations: Never    Relationship status: Married   Intimate partner violence    Fear of current or ex partner: No    Emotionally abused: No    Physically abused: No    Forced sexual activity: No  Other Topics Concern   Not on file  Social History Narrative   Works at [employer] in [place].    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   Also has to get items  from high shelves-causes achy pain in shoulder area      Married.   Children are grown, out  of house.      Family History:   The patient's family history includes Coronary artery disease in her mother and sister; Diabetes in her maternal aunt, maternal uncle, mother, paternal aunt, and paternal uncle; Heart attack in her father, maternal aunt, maternal grandfather, maternal grandmother, maternal uncle, paternal aunt, paternal grandfather, paternal grandmother, and paternal uncle; Hypertension in her brother, maternal aunt, maternal uncle, mother, paternal aunt, paternal uncle, sister, and son. There is no history of Colon cancer.     Review of Systems    General:  No chills, fever, night sweats or weight changes.  Cardiovascular:  No edema, orthopnea, palpitations, paroxysmal nocturnal dyspnea. Positive for chest pain and dyspnea on exertion.  Dermatological: No rash, lesions/masses Respiratory: No cough, dyspnea Urologic: No hematuria, dysuria Abdominal:   No nausea, vomiting, diarrhea, bright red blood per rectum, melena, or hematemesis Neurologic:  No visual changes, wkns, changes in mental status. All other systems reviewed and are otherwise negative except as noted above.  Physical Exam    Vitals:   02/28/19 0827 02/28/19 0830 02/28/19 1006  BP:  (!) 174/92 (!) 159/99  Pulse:  78 79  Resp:  20 16  Temp:  (!) 97.5 F (36.4 C)   TempSrc:  Oral   SpO2:  99% 94%  Weight: 74.8 kg    Height: 5\' 2"  (1.575 m)     No intake or output data in the 24 hours ending 02/28/19 1037 Filed Weights   02/28/19 0827  Weight: 74.8 kg   Body mass index is 30.18 kg/m.   General: Well developed, well nourished,female in no acute distress. Head: Normocephalic, atraumatic, sclera non-icteric, no xanthomas, nares are without discharge.  Neck: No carotid bruits. JVD not elevated.  Lungs: Respirations regular and unlabored, decreased along bases bilaterally Heart: Regular rate and rhythm. No  S3 or S4.  No murmur, no rubs, or gallops appreciated. Abdomen: Soft, non-tender, non-distended with normoactive bowel sounds. No hepatomegaly. No rebound/guarding. No obvious abdominal masses. Msk:  Strength and tone appear normal for age. No joint deformities or effusions. Extremities: No clubbing or cyanosis. No edema.  Distal pedal pulses are 2+ bilaterally. Neuro: Alert and oriented X 3. Moves all extremities spontaneously. No focal deficits noted. Psych:  Responds to questions appropriately with a normal affect. Skin: No rashes or lesions noted  Labs and Radiology Studies    EKG:  The ECG that was done was personally reviewed and demonstrates NSR, HR 79, with LVH and TWI along lateral leads which is more prominent when compared to her prior tracings from yesterday and 7/13.   Relevant CV Studies:  NST: 08/2018  Nuclear stress EF: 59%.  There was no ST segment deviation noted during stress.  This is a low risk study.  The left ventricular ejection fraction is normal (55-65%).   Echocardiogram: 01/2019 IMPRESSIONS   1. The left ventricle has normal systolic function, with an ejection fraction of 55-60%. The cavity size was normal. There is mild concentric left ventricular hypertrophy. Left ventricular diastolic Doppler parameters are consistent with  pseudonormalization. Elevated left ventricular end-diastolic pressure.  2. The right ventricle has normal systolic function. The cavity was normal. There is no increase in right ventricular wall thickness.  3. The mitral valve is grossly normal. Mild thickening of the mitral valve leaflet.  4. Torn mitral valve chordae.  5. The tricuspid valve is grossly normal.  6. The aortic valve is grossly normal.  7. The aortic root is normal in size and structure.  8. The inferior vena cava was dilated in size with >50% respiratory variability.  Laboratory Data:  Chemistry Recent Labs  Lab 02/24/19 0945 02/28/19 0846  NA 138 139  K  4.8 4.1  CL 94* 96*  CO2 27 28  GLUCOSE 157* 126*  BUN 84* 47*  CREATININE 7.00* 5.70*  CALCIUM 9.2 8.9  GFRNONAA 6* 8*  GFRAA 7* 9*  ANIONGAP 17* 15    Recent Labs  Lab 02/24/19 0945  PROT 7.0  ALBUMIN 3.0*  AST 47*  ALT 62*  ALKPHOS 166*  BILITOT 0.9   Hematology Recent Labs  Lab 02/24/19 0945 02/28/19 0846  WBC 17.0* 11.8*  RBC 2.77* 3.08*  HGB 8.9* 9.8*  HCT 29.0* 32.9*  MCV 104.7* 106.8*  MCH 32.1 31.8  MCHC 30.7 29.8*  RDW 18.8* 19.6*  PLT 537* 472*   Cardiac EnzymesNo results for input(s): TROPONINI in the last 168 hours. No results for input(s): TROPIPOC in the last 168 hours.  BNP Recent Labs  Lab 02/24/19 0945  BNP >4,500.0*    DDimer No results for input(s): DDIMER in the last 168 hours.  Radiology/Studies:  Dg Chest 2 View  Result [name]: 02/28/2019 CLINICAL DATA:  Chest pain. EXAM: CHEST - 2 VIEW COMPARISON:  Chest x-ray dated February 24, 2019. FINDINGS: Stable cardiomegaly. Normal pulmonary vascularity. Similar appearing left basilar opacity and small left pleural effusion. The right lung is clear. No pneumothorax. No acute osseous abnormality. Oral contrast in the left colon. IMPRESSION: 1. Unchanged small left pleural effusion with adjacent left lower lobe atelectasis or infiltrate. Electronically Signed   By: Titus Dubin M.D.   On: 02/28/2019 09:36    Assessment and Plan:   1. Atypical Chest Pain/EKG Changes - She has been experiencing episodes of shooting sternal chest discomfort for the past 1 to 2 months and was initially treated for pleuritis as symptoms were worse with inspiration and CTA was negative for PE. Was scheduled to have an outpatient coronary CT but presented with recurrent symptoms. - Initial HS Troponin negative with repeat pending. EKG shows NSR, HR 79, with LVH and TWI along lateral leads which is more prominent when compared to her prior tracings from yesterday and 7/13 and new from earlier this year but noted on prior  tracings. - reviewed with Dr. Johnsie Cancel and will plan to proceed with cardiac catheterization for definitive evaluation. The patient understands that risks include but are not limited to stroke (1 in 1000), death (1 in 57), kidney failure [usually temporary] (1 in 500), bleeding (1 in 200), allergic reaction [possibly serious] (1 in 200).  Will start Heparin. Continue ASA, statin, and BB. Recheck FLP.   2. HTN - BP elevated at 172/99 on most recent check.  She was just restarted on Amlodipine 5 mg daily yesterday. Continue this along with Bystolic and Losartan (on ARB at the discretion of Nephrology).   3. HLD - Followed by PCP. She is currently on Atorvastatin 20 mg daily. Will recheck FLP with AM labs.  4. ESRD - on HD - T/TH/SAT schedule. Pending when her discharge is following cath may need to consult Nephrology to assist with HD if not discharged by early tomorrow morning.     For questions or updates, please contact [facility] Please consult [email or web address] for contact info under Cardiology/STEMI.   Signed, Erma Heritage, PA-C 02/28/2019, 10:37 AM Pager: [phone]  Patient examined chart reviewed. Exam with black female in no distress. Lungs clear. SEM Fistula LUE with thrill Recurrent  SSCP some responsive to nitro. Marked lateral T wave changes. Troponin negative Discussed options with patient Favor transfer to [facility] for [facility] today Risks including stroke discussed willing to proceed. COVID negative 2 weeks ago getting repeat rapid test in [facility].    Jenkins Rouge

## 2019-03-01 ENCOUNTER — Encounter (HOSPITAL_COMMUNITY): Payer: Self-pay | Admitting: Nurse Practitioner

## 2019-03-01 DIAGNOSIS — I5030 Unspecified diastolic (congestive) heart failure: Secondary | ICD-10-CM

## 2019-03-01 DIAGNOSIS — R079 Chest pain, unspecified: Secondary | ICD-10-CM | POA: Diagnosis not present

## 2019-03-01 DIAGNOSIS — I25110 Atherosclerotic heart disease of native coronary artery with unstable angina pectoris: Secondary | ICD-10-CM | POA: Diagnosis not present

## 2019-03-01 DIAGNOSIS — I110 Hypertensive heart disease with heart failure: Secondary | ICD-10-CM

## 2019-03-01 DIAGNOSIS — E119 Type 2 diabetes mellitus without complications: Secondary | ICD-10-CM | POA: Diagnosis not present

## 2019-03-01 DIAGNOSIS — N186 End stage renal disease: Secondary | ICD-10-CM | POA: Diagnosis not present

## 2019-03-01 DIAGNOSIS — N2581 Secondary hyperparathyroidism of renal origin: Secondary | ICD-10-CM | POA: Diagnosis not present

## 2019-03-01 DIAGNOSIS — E1129 Type 2 diabetes mellitus with other diabetic kidney complication: Secondary | ICD-10-CM | POA: Diagnosis not present

## 2019-03-01 DIAGNOSIS — I2510 Atherosclerotic heart disease of native coronary artery without angina pectoris: Secondary | ICD-10-CM | POA: Insufficient documentation

## 2019-03-01 DIAGNOSIS — D631 Anemia in chronic kidney disease: Secondary | ICD-10-CM | POA: Diagnosis not present

## 2019-03-01 LAB — BASIC METABOLIC PANEL
Anion gap: 15 (ref 5–15)
BUN: 56 mg/dL — ABNORMAL HIGH (ref 6–20)
CO2: 26 mmol/L (ref 22–32)
Calcium: 8.5 mg/dL — ABNORMAL LOW (ref 8.9–10.3)
Chloride: 97 mmol/L — ABNORMAL LOW (ref 98–111)
Creatinine, Ser: 6.94 mg/dL — ABNORMAL HIGH (ref 0.44–1.00)
GFR calc Af Amer: 7 mL/min — ABNORMAL LOW (ref >60)
GFR calc non Af Amer: 6 mL/min — ABNORMAL LOW (ref >60)
Glucose, Bld: 101 mg/dL — ABNORMAL HIGH (ref 70–99)
Potassium: 4.3 mmol/L (ref 3.5–5.1)
Sodium: 138 mmol/L (ref 135–145)

## 2019-03-01 LAB — LIPID PANEL
Cholesterol: 76 mg/dL (ref 0–200)
HDL: 31 mg/dL — ABNORMAL LOW (ref >40)
LDL Cholesterol: 29 mg/dL (ref 0–99)
Total CHOL/HDL Ratio: 2.5 RATIO
Triglycerides: 80 mg/dL (ref <150)
VLDL: 16 mg/dL (ref 0–40)

## 2019-03-01 NOTE — Discharge Summary (Signed)
Discharge Summary    Patient ID: Megan [name] MRN: [id number]; DOB: 1961/02/09  Admit date: 02/28/2019 Discharge date: 03/01/2019  Primary Care Provider: [name], MD  Primary Cardiologist: Carlyle Dolly, MD  Primary Electrophysiologist:  None   Discharge Diagnoses    Principal Problem:   Atypical chest pain  **S/p cath this admission revealing nonobstructive CAD  Active Problems:   Obesity   Essential hypertension, benign   Type 2 diabetes mellitus ([place])   ESRD (end stage renal disease) ([place])   (HFpEF) heart failure with preserved ejection fraction (HCC)   Mixed hyperlipidemia   GERD (gastroesophageal reflux disease)  Allergies Allergies  Allergen Reactions   Hydrocodone     itching    Diagnostic Studies/Procedures    Cardiac Catheterization 7.17.2020  Left Anterior Descending  Mid LAD lesion 10% stenosed  Mid LAD lesion is 10% stenosed.  Right Coronary Artery  Prox RCA lesion 25% stenosed  Prox RCA lesion is 25% stenosed.   Ventricle The left ventricular size is normal. The left ventricular systolic function is normal. LV end diastolic pressure is mildly elevated. The left ventricular ejection fraction is 55-65% by visual estimate. No regional wall motion abnormalities.  Aortic Valve There is no aortic valve stenosis.  _____________   History of Present Illness     58 year old female with a history of hypertension, hyperlipidemia, end-stage renal disease on hemodialysis, breast cancer status post right mastectomy in February 2020, GERD, type 2 diabetes mellitus, HFpEF, and obesity.  Over the course of 2020, she has had multiple evaluations for intermittent dyspnea and atypical chest discomfort.  Stress testing in January of this year was nonischemic.  Echocardiography in June of this year showed normal LV function.  More recently, she was seen in the emergency department on July 13 with chest pain that was worse with inspiration and recalcitrant  to prednisone taper.  Troponins were normal and she was discharged home.  She followed up in cardiology clinic on July 16 with complaints of progressive shooting chest discomfort and also dyspnea on exertion.  Decision was made to pursue coronary CT angiography.  Unfortunately, she had recurrent chest discomfort on the evening of July 16 into July 17, prompting her to present to the emergency department at [facility].  There, ECG was notable for LVH with lateral T wave inversion.  High-sensitivity troponin was normal.  Decision was made to transfer her to [facility] for admission and diagnostic catheterization.  Hospital Course     Consultants: Nephrology  Patient ruled out for myocardial infarction.  She underwent diagnostic catheterization on the afternoon of July 17, revealing minimal nonobstructive coronary artery disease.  Post procedure, she had no recurrent chest pain.  She has been ambulating without difficulty.  She was seen by nephrology this AM and they were able to arrange for outpatient HD later today.  She will follow-up in cardiology clinic in approximately 2 weeks. ___________  Discharge Vitals Blood pressure 114/75, pulse 67, temperature 97.7 F (36.5 C), temperature source Oral, resp. rate 17, height 5\' 2"  (1.575 m), weight 75.5 kg, SpO2 100 %.  Filed Weights   02/28/19 0827 03/01/19 0421  Weight: 74.8 kg 75.5 kg    Labs & Radiologic Studies    CBC Recent Labs    02/28/19 0846  WBC 11.8*  HGB 9.8*  HCT 32.9*  MCV 106.8*  PLT 696*   Basic Metabolic Panel Recent Labs    02/28/19 0846 03/01/19 0320  NA 139 138  K 4.1 4.3  CL 96* 97*  CO2 28 26  GLUCOSE 126* 101*  BUN 47* 56*  CREATININE 5.70* 6.94*  CALCIUM 8.9 8.5*   Fasting Lipid Panel Recent Labs    03/01/19 0320  CHOL 76  HDL 31*  LDLCALC 29  TRIG 80  CHOLHDL 2.5   _____________  Dg Chest 2 View  Result Date: 02/28/2019 CLINICAL DATA:  Chest pain. EXAM: CHEST - 2 VIEW  COMPARISON:  Chest x-ray dated February 24, 2019. FINDINGS: Stable cardiomegaly. Normal pulmonary vascularity. Similar appearing left basilar opacity and small left pleural effusion. The right lung is clear. No pneumothorax. No acute osseous abnormality. Oral contrast in the left colon. IMPRESSION: 1. Unchanged small left pleural effusion with adjacent left lower lobe atelectasis or infiltrate. Electronically Signed   By: Titus Dubin M.D.   On: 02/28/2019 09:36   Disposition   Pt is being discharged home today in good condition.  Follow-up Plans & Appointments   Follow-up Information    Erma Heritage, PA-C Follow up in 2 week(s).   Specialties: Physician Assistant, Cardiology Why: We will arrange for f/u and contact you. Contact information: [address] [place] [address] [phone]          Discharge Instructions    Call MD for:  difficulty breathing, headache or visual disturbances   Complete by: As directed    Call MD for:  redness, tenderness, or signs of infection (pain, swelling, redness, odor or green/yellow discharge around incision site)   Complete by: As directed    Diet - low sodium heart healthy   Complete by: As directed    Increase activity slowly   Complete by: As directed       Discharge Medications   Allergies as of 03/01/2019      Reactions   Hydrocodone    itching      Medication List    TAKE these medications   amLODipine 5 MG tablet Commonly known as: NORVASC Take 1 tablet (5 mg total) by mouth daily.   anastrozole 1 MG tablet Commonly known as: ARIMIDEX Take 1 tablet (1 mg total) by mouth daily.   aspirin EC 81 MG tablet Take 81 mg by mouth daily.   atorvastatin 20 MG tablet Commonly known as: LIPITOR Take 20 mg by mouth daily.   butalbital-acetaminophen-caffeine 50-325-40 MG tablet Commonly known as: FIORICET Take 1 tablet by mouth every 6 (six) hours as needed for headache.   CALCIUM 1000 + D PO Take 1 tablet by mouth  2 (two) times a day.   cinacalcet 30 MG tablet Commonly known as: SENSIPAR Take 2 tablets (60 mg total) by mouth daily. What changed: how much to take   Dialyvite 800 0.8 MG Tabs Take 800 mg by mouth daily.   gabapentin 300 MG capsule Commonly known as: NEURONTIN TAKE 1 CAPSULE(300 MG) BY MOUTH AT BEDTIME   Investigational vitamin D 600 UNITS capsule SWOG S0812 Take 600 Units by mouth daily. Take with food.   lidocaine-prilocaine cream Commonly known as: EMLA APPLY SMALL AMOUNT TO ACCESS SITE (AVF) 1 TO 2 HOURS BEFORE DIALYSIS. COVER WITH OCCLUSIVE DRESSING (SARAN WRAP)   losartan 50 MG tablet Commonly known as: COZAAR Take 1 tablet (50 mg total) by mouth daily.   nebivolol 10 MG tablet Commonly known as: Bystolic Take 1 tablet (10 mg total) by mouth 2 (two) times daily. What changed: when to take this   nitroGLYCERIN 0.4 MG SL tablet Commonly known as:  NITROSTAT Place 1 tablet (0.4 mg total) under the tongue every 5 (five) minutes as needed for chest pain.   pantoprazole 40 MG tablet Commonly known as: PROTONIX TAKE 1 TABLET(40 MG) BY MOUTH DAILY   tiZANidine 4 MG capsule Commonly known as: ZANAFLEX TAKE 1 CAPSULE(4 MG) BY MOUTH TWICE DAILY WITH A MEAL. START 1 CAPSULE AT NIGHT FOR 2 WEEKS AND THEN TWICE DAILY What changed:   how much to take  how to take this  when to take this  reasons to take this  additional instructions        Acute coronary syndrome (MI, NSTEMI, STEMI, etc) this admission?: No.    Outstanding Labs/Studies   None  Duration of Discharge Encounter   Greater than 30 minutes including physician time.  Signed, Murray Hodgkins, NP 03/01/2019, 7:57 AM

## 2019-03-01 NOTE — Discharge Instructions (Signed)
**PLEASE REMEMBER TO BRING ALL OF YOUR MEDICATIONS TO EACH OF YOUR FOLLOW-UP OFFICE VISITS.  Groin Site Care Refer to this sheet in the next few weeks. These instructions provide you with information on caring for yourself after your procedure. Your caregiver may also give you more specific instructions. Your treatment has been planned according to current medical practices, but problems sometimes occur. Call your caregiver if you have any problems or questions after your procedure. HOME CARE INSTRUCTIONS  You may shower 24 hours after the procedure. Remove the bandage (dressing) and gently wash the site with plain soap and water. Gently pat the site dry.   Do not apply powder or lotion to the site.   Do not sit in a bathtub, swimming pool, or whirlpool for 5 to 7 days.   No bending, squatting, or lifting anything over 10 pounds (4.5 kg) as directed by your caregiver.   Inspect the site at least twice daily.   Do not drive home if you are discharged the same day of the procedure. Have someone else drive you.   You may drive 24 hours after the procedure unless otherwise instructed by your caregiver.  What to expect:  Any bruising will usually fade within 1 to 2 weeks.   Blood that collects in the tissue (hematoma) may be painful to the touch. It should usually decrease in size and tenderness within 1 to 2 weeks.  SEEK IMMEDIATE MEDICAL CARE IF:  You have unusual pain at the groin site or down the affected leg.   You have redness, warmth, swelling, or pain at the groin site.   You have drainage (other than a small amount of blood on the dressing).   You have chills.   You have a fever or persistent symptoms for more than 72 hours.   You have a fever and your symptoms suddenly get worse.   Your leg becomes pale, cool, tingly, or numb.  You have heavy bleeding from the site. Hold pressure on the site.  _____________      10 Habits of Highly Healthy People  [place] wants  to help you get well and stay well.  Live a longer, healthier life by practicing healthy habits every day.  1.  Visit your primary care provider regularly. 2.  Make time for family and friends.  Healthy relationships are important. 3.  Take medications as directed by your provider. 4.  Maintain a healthy weight and a trim waistline. 5.  Eat healthy meals and snacks, rich in fruits, vegetables, whole grains, and lean proteins. 6.  Get moving every day - aim for 150 minutes of moderate physical activity each week. 7.  Don't smoke. 8.  Avoid alcohol or drink in moderation. 9.  Manage stress through meditation or mindful relaxation. 10.  Get seven to nine hours of quality sleep each night.  Want more information on healthy habits?  To learn more about these and other healthy habits, visit [email or web address].[email or web address]. _____________       Megan [name] have received care from [facility] during this hospital stay and we look forward to continuing to provide you with excellent care in our office settings after you've left the hospital.  In order to assure a smoother transition to home following your discharge from the hospital, we will likely have you see one of our nurse practitioners or physician assistants within a few weeks of discharge.  Our advanced practice providers work closely with your physician in order to  address all of your heart's needs in a timely manner.  More information about all of our providers may be found here: [email or web address]  Please plan to bring all of your prescriptions to your follow-up appointment and don't hesitate to contact us with questions or concerns.  [facility] [place] - [phone] [place] [phone] [facility] [place] - [phone] [facility] [place] - [phone] [place] - [phone] [facility] [place] - [phone] [place] [phone]     Femoral Site Care This sheet gives you information about how to care for yourself after your procedure. Your health care provider may also give you more specific instructions. If you have problems or questions, contact your health care provider. What can I expect after the procedure? After the procedure, it is common to have:  Bruising that usually fades within 1-2 weeks.  Tenderness at the site. Follow these instructions at home: Wound care  Follow instructions from your health care provider about how to take care of your insertion site. Make sure you: ? Wash your hands with soap and water before you change your bandage (dressing). If soap and water are not available, use hand sanitizer. ? Change your dressing as told by your health care provider. ? Leave stitches (sutures), skin glue, or adhesive strips in place. These skin closures may need to stay in place for 2 weeks or longer. If adhesive strip edges start to loosen and curl up, you may trim the loose edges. Do not remove adhesive strips completely unless your health care provider tells you to do that.  Do not take baths, swim, or use a hot tub until your health care provider approves.  You may shower 24-48 hours after the procedure or as told by your health care provider. ? Gently wash the site with plain soap and water. ? Pat the area dry with a clean towel. ? Do not rub the site. This may cause bleeding.  Do not apply powder or lotion to the site. Keep the site clean and dry.  Check your femoral site every day for signs of infection. Check for: ? Redness, swelling, or pain. ? Fluid or blood. ? Warmth. ? Pus or a bad smell. Activity  For the first 2-3 days after your procedure, or as long as directed: ? Avoid climbing stairs as much as possible. ? Do not squat.  Do not lift anything that is heavier  than 10 lb (4.5 kg), or the limit that you are told, until your health care provider says that it is safe.  Rest as directed. ? Avoid sitting for a long time without moving. Get up to take short walks every 1-2 hours.  Do not drive for 24 hours if you were given a medicine to help you relax (sedative). General instructions  Take over-the-counter and prescription medicines only as told by your health care provider.  Keep all follow-up visits as told by your health care provider. This is important. Contact a health care provider if you have:  A fever or chills.  You have redness, swelling, or pain around your insertion site. Get help right away if:  The catheter insertion area swells very fast.  You pass out.  You suddenly start to sweat or your skin gets clammy.  The catheter insertion area is bleeding, and the bleeding does not stop when you hold steady pressure on the area.  The area near  or just beyond the catheter insertion site becomes pale, cool, tingly, or numb. These symptoms may represent a serious problem that is an emergency. Do not wait to see if the symptoms will go away. Get medical help right away. Call your local emergency services (911 in the [place] Do not drive yourself to the hospital. Summary  After the procedure, it is common to have bruising that usually fades within 1-2 weeks.  Check your femoral site every day for signs of infection.  Do not lift anything that is heavier than 10 lb (4.5 kg), or the limit that you are told, until your health care provider says that it is safe. This information is not intended to replace advice given to you by your health care provider. Make sure you discuss any questions you have with your health care provider. Document Released: 04/03/2014 Document Revised: 08/13/2017 Document Reviewed: 08/13/2017 Elsevier Patient Education  2020 [place] for Dialysis Dialysis is a treatment that cleans your blood.  It is used when your kidneys are damaged. When you need dialysis, you should watch what you eat. This is because some nutrients can build up in your blood between treatments and make you sick. Your doctor or diet specialist (dietitian) will:  Tell you what nutrients you should include or avoid.  Tell you how much of these nutrients you should get each day.  Help you plan meals.  Tell you how much to drink each day. What are tips for following this plan? Reading food labels  Check food labels for: ? Potassium. This is found in milk, fruits, and vegetables. ? Phosphorus. This is found in milk, cheese, beans, nuts, and carbonated beverages. ? Salt (sodium). This is in processed meats, cured meats, ready-made frozen meals, canned vegetables, and salty snack foods.  Try to find foods that are low in potassium, phosphorus, and sodium.  Look for foods that are labeled "sodium free," "reduced sodium," or "low sodium." Shopping  Do not buy whole-grain and high-fiber foods.  Do not buy or use salt substitutes.  Do not buy processed foods. Cooking  Drain all fluid from cooked vegetables and canned fruits before you eat them.  Before you cook potatoes, cut them into small pieces. Then boil them in unsalted water.  Try using herbs and spices that do not contain sodium to add flavor. Meal planning Most people on dialysis should try to eat:  6-11 servings of grains each day. One serving is equal to 1 slice of bread or  cup of cooked rice or pasta.  2-3 servings of low-potassium vegetables each day. One serving is equal to  cup.  2-3 servings of low-potassium fruits each day. One serving is equal to  cup.  Protein, such as meat, poultry, fish, and eggs. Talk with your doctor or dietitian about the right amount and type of protein to eat.   cup of dairy each day. General information  Follow your doctor's instructions about how much to drink. You may be told to: ? Write down what  you drink. ? Write down the foods you eat that are made mostly from water, such as gelatin and soups. ? Drink from small cups.  Take vitamin and mineral supplements only as told by your doctor.  Take over-the-counter and prescription medicines only as told by your doctor. What foods can I eat?     Fruits Apples. Fresh or frozen berries. Fresh or canned pears, peaches, and pineapple. Grapes. Plums. Vegetables Fresh or  frozen broccoli, carrots, and green beans. Cabbage. Cauliflower. Celery. Cucumbers. Eggplant. Radishes. Zucchini. Grains White bread. White rice. Cooked cereal. Unsalted popcorn. Tortillas. Pasta. Meats and other proteins Fresh or frozen beef, pork, chicken, and fish. Eggs. Dairy Cream cheese. Heavy cream. Ricotta cheese. Beverages Apple cider. Cranberry juice. Grape juice. Lemonade. Black coffee. Rice milk (that is not enriched or fortified). Seasonings and condiments Herbs. Spices. Jam and jelly. Honey. Sweets and desserts Sherbet. Cakes. Cookies. Fats and oils Olive oil, canola oil, and safflower oil. Other foods Non-dairy creamer. Non-dairy whipped topping. Homemade broth without salt. The items listed above may not be a complete list of foods and beverages you can eat. Contact your dietitian for more options. What foods should I avoid? Fruits Star fruit. Bananas. Oranges. Kiwi. Nectarines. Prunes. Melon. Dried fruit. Avocado. Vegetables Potatoes. Beets. Tomatoes. Winter squash and pumpkin. Asparagus. Spinach. Parsnips. Grains Whole-grain bread. Whole-grain pasta. High-fiber cereal. Meats and other proteins Canned, smoked, and cured meats. Packaged lunch meat. Sardines. Nuts and seeds. Peanut butter. Beans and legumes. Dairy Milk. Buttermilk. Yogurt. Cheese and cottage cheese. Processed cheese spreads. Beverages Orange juice. Prune juice. Carbonated soft drinks. Seasonings and condiments Salt. Salt substitutes. Soy sauce. Sweets and desserts Ice cream.  Chocolate. Candied nuts. Fats and oils Butter. Margarine. Other foods Ready-made frozen meals. Canned soups. The items listed above may not be a complete list of foods and beverages you should avoid. Contact your dietitian for more information. Summary  If you are having dialysis, it is important to watch what you eat. Certain nutrients and wastes can build up in your blood and cause you to get sick.  Your dietitian will help you make an eating plan that meets your needs.  Avoid foods that are high in potassium, salt (sodium), and phosphorus. Restrict fluids as told by your doctor or dietitian. This information is not intended to replace advice given to you by your health care provider. Make sure you discuss any questions you have with your health care provider. Document Released: 01/30/2012 Document Revised: 10/17/2017 Document Reviewed: 08/01/2017 Elsevier Patient Education  2020 [employer].

## 2019-03-01 NOTE — Progress Notes (Signed)
Subjective:  Atypical muscular pain in chest   Objective:  Vitals:   02/28/19 1600 02/28/19 2004 03/01/19 0406 03/01/19 0421  BP: (!) 174/92 (!) 156/81 114/75   Pulse: 70 76 67   Resp: 15  17   Temp:  98.2 F (36.8 C) 97.7 F (36.5 C)   TempSrc:  Oral Oral   SpO2: 100% 96% 100%   Weight:    75.5 kg  Height:        Intake/Output from previous day:  Intake/Output Summary (Last 24 hours) at 03/01/2019 0729 Last data filed at 03/01/2019 0500 Gross per 24 hour  Intake 403.44 ml  Output -  Net 403.44 ml    Physical Exam: Affect appropriate Chronically ill black female  HEENT: normal Neck supple with no adenopathy JVP normal no bruits no thyromegaly Lungs clear with no wheezing and good diaphragmatic motion Heart:  S1/S2 no murmur, no rub, gallop or click PMI normal Abdomen: benighn, BS positve, no tenderness, no AAA no bruit.  No HSM or HJR Distal pulses intact with no bruits No edema Neuro non-focal Skin warm and dry No muscular weakness LUE fistula    Lab Results: Basic Metabolic Panel: Recent Labs    02/28/19 0846 03/01/19 0320  NA 139 138  K 4.1 4.3  CL 96* 97*  CO2 28 26  GLUCOSE 126* 101*  BUN 47* 56*  CREATININE 5.70* 6.94*  CALCIUM 8.9 8.5*   Liver Function Tests: No results for input(s): AST, ALT, ALKPHOS, BILITOT, PROT, ALBUMIN in the last 72 hours. No results for input(s): LIPASE, AMYLASE in the last 72 hours. CBC: Recent Labs    02/28/19 0846  WBC 11.8*  HGB 9.8*  HCT 32.9*  MCV 106.8*  PLT 472*   Fasting Lipid Panel: Recent Labs    03/01/19 0320  CHOL 76  HDL 31*  LDLCALC 29  TRIG 80  CHOLHDL 2.5    Imaging: Dg Chest 2 View  Result Date: 02/28/2019 CLINICAL DATA:  Chest pain. EXAM: CHEST - 2 VIEW COMPARISON:  Chest x-ray dated February 24, 2019. FINDINGS: Stable cardiomegaly. Normal pulmonary vascularity. Similar appearing left basilar opacity and small left pleural effusion. The right lung is clear. No pneumothorax. No  acute osseous abnormality. Oral contrast in the left colon. IMPRESSION: 1. Unchanged small left pleural effusion with adjacent left lower lobe atelectasis or infiltrate. Electronically Signed   By: Titus Dubin M.D.   On: 02/28/2019 09:36    Cardiac Studies:  ECG: SR lateral T wave inversions    Telemetry:  NSR   Echo:   Medications:   . amLODipine  5 mg Oral Daily  . anastrozole  1 mg Oral Daily  . aspirin EC  81 mg Oral Daily  . atorvastatin  20 mg Oral q1800  . cinacalcet  30 mg Oral Q supper  . gabapentin  300 mg Oral QHS  . losartan  50 mg Oral Daily  . nebivolol  10 mg Oral Daily  . pantoprazole  40 mg Oral Daily  . sodium chloride flush  3 mL Intravenous Q12H     . sodium chloride    . sodium chloride      Assessment/Plan:   Chest Pain:  No cardiac cath with no stenotic CAD. CXR clear likely muscular ok to d/c home and f/u with Dr Dennard Schaumann  HTN:  Well controlled.  Continue current medications and low sodium Dash type diet.    CRF:  Dialysis at [facility] then d/c home  Jenkins Rouge 03/01/2019, 7:29 AM

## 2019-03-01 NOTE — Progress Notes (Signed)
Megan Harman, NP with nephrology asked that patient be discharged so she could make her scheduled 12pm dialysis appointment.  Discharge instructions reviewed with and given to patient.

## 2019-03-03 ENCOUNTER — Encounter (HOSPITAL_COMMUNITY): Payer: Self-pay | Admitting: Interventional Cardiology

## 2019-03-03 DIAGNOSIS — E46 Unspecified protein-calorie malnutrition: Secondary | ICD-10-CM | POA: Insufficient documentation

## 2019-03-04 DIAGNOSIS — E119 Type 2 diabetes mellitus without complications: Secondary | ICD-10-CM | POA: Diagnosis not present

## 2019-03-04 DIAGNOSIS — E1129 Type 2 diabetes mellitus with other diabetic kidney complication: Secondary | ICD-10-CM | POA: Diagnosis not present

## 2019-03-04 DIAGNOSIS — N186 End stage renal disease: Secondary | ICD-10-CM | POA: Diagnosis not present

## 2019-03-04 DIAGNOSIS — N2581 Secondary hyperparathyroidism of renal origin: Secondary | ICD-10-CM | POA: Diagnosis not present

## 2019-03-04 DIAGNOSIS — D631 Anemia in chronic kidney disease: Secondary | ICD-10-CM | POA: Diagnosis not present

## 2019-03-06 DIAGNOSIS — N186 End stage renal disease: Secondary | ICD-10-CM | POA: Diagnosis not present

## 2019-03-06 DIAGNOSIS — N2581 Secondary hyperparathyroidism of renal origin: Secondary | ICD-10-CM | POA: Diagnosis not present

## 2019-03-06 DIAGNOSIS — E1129 Type 2 diabetes mellitus with other diabetic kidney complication: Secondary | ICD-10-CM | POA: Diagnosis not present

## 2019-03-06 DIAGNOSIS — D631 Anemia in chronic kidney disease: Secondary | ICD-10-CM | POA: Diagnosis not present

## 2019-03-06 DIAGNOSIS — E119 Type 2 diabetes mellitus without complications: Secondary | ICD-10-CM | POA: Diagnosis not present

## 2019-03-08 ENCOUNTER — Other Ambulatory Visit: Payer: Self-pay | Admitting: Family Medicine

## 2019-03-08 DIAGNOSIS — E1129 Type 2 diabetes mellitus with other diabetic kidney complication: Secondary | ICD-10-CM | POA: Diagnosis not present

## 2019-03-08 DIAGNOSIS — N2581 Secondary hyperparathyroidism of renal origin: Secondary | ICD-10-CM | POA: Diagnosis not present

## 2019-03-08 DIAGNOSIS — N186 End stage renal disease: Secondary | ICD-10-CM | POA: Diagnosis not present

## 2019-03-08 DIAGNOSIS — D631 Anemia in chronic kidney disease: Secondary | ICD-10-CM | POA: Diagnosis not present

## 2019-03-08 DIAGNOSIS — E119 Type 2 diabetes mellitus without complications: Secondary | ICD-10-CM | POA: Diagnosis not present

## 2019-03-11 ENCOUNTER — Ambulatory Visit: Payer: Medicare Other | Admitting: Student

## 2019-03-11 DIAGNOSIS — E1129 Type 2 diabetes mellitus with other diabetic kidney complication: Secondary | ICD-10-CM | POA: Diagnosis not present

## 2019-03-11 DIAGNOSIS — N2581 Secondary hyperparathyroidism of renal origin: Secondary | ICD-10-CM | POA: Diagnosis not present

## 2019-03-11 DIAGNOSIS — E119 Type 2 diabetes mellitus without complications: Secondary | ICD-10-CM | POA: Diagnosis not present

## 2019-03-11 DIAGNOSIS — D631 Anemia in chronic kidney disease: Secondary | ICD-10-CM | POA: Diagnosis not present

## 2019-03-11 DIAGNOSIS — N186 End stage renal disease: Secondary | ICD-10-CM | POA: Diagnosis not present

## 2019-03-13 DIAGNOSIS — E119 Type 2 diabetes mellitus without complications: Secondary | ICD-10-CM | POA: Diagnosis not present

## 2019-03-13 DIAGNOSIS — E1129 Type 2 diabetes mellitus with other diabetic kidney complication: Secondary | ICD-10-CM | POA: Diagnosis not present

## 2019-03-13 DIAGNOSIS — D631 Anemia in chronic kidney disease: Secondary | ICD-10-CM | POA: Diagnosis not present

## 2019-03-13 DIAGNOSIS — N186 End stage renal disease: Secondary | ICD-10-CM | POA: Diagnosis not present

## 2019-03-13 DIAGNOSIS — N2581 Secondary hyperparathyroidism of renal origin: Secondary | ICD-10-CM | POA: Diagnosis not present

## 2019-03-14 ENCOUNTER — Telehealth: Payer: Self-pay | Admitting: Cardiology

## 2019-03-14 NOTE — Telephone Encounter (Signed)
Patient has scale and bp cuff       Virtual Visit Pre-Appointment Phone Call  "(Name), I am calling you today to discuss your upcoming appointment. We are currently trying to limit exposure to the virus that causes COVID-19 by seeing patients at home rather than in the office."  1. "What is the BEST phone number to call the day of the visit?" - include this in appointment notes  2. Do you have or have access to (through a family member/friend) a smartphone with video capability that we can use for your visit?" a. If yes - list this number in appt notes as cell (if different from BEST phone #) and list the appointment type as a VIDEO visit in appointment notes b. If no - list the appointment type as a PHONE visit in appointment notes  3. Confirm consent - "In the setting of the current Covid19 crisis, you are scheduled for a (phone or video) visit with your provider on (date) at (time).  Just as we do with many in-office visits, in order for you to participate in this visit, we must obtain consent.  If you'd like, I can send this to your mychart (if signed up) or email for you to review.  Otherwise, I can obtain your verbal consent now.  All virtual visits are billed to your insurance company just like a normal visit would be.  By agreeing to a virtual visit, we'd like you to understand that the technology does not allow for your provider to perform an examination, and thus may limit your provider's ability to fully assess your condition. If your provider identifies any concerns that need to be evaluated in person, we will make arrangements to do so.  Finally, though the technology is pretty good, we cannot assure that it will always work on either your or our end, and in the setting of a video visit, we may have to convert it to a phone-only visit.  In either situation, we cannot ensure that we have a secure connection.  Are you willing to proceed?" STAFF: Did the patient verbally acknowledge  consent to telehealth visit? Document YES/NO here: yes  4. Advise patient to be prepared - "Two hours prior to your appointment, go ahead and check your blood pressure, pulse, oxygen saturation, and your weight (if you have the equipment to check those) and write them all down. When your visit starts, your provider will ask you for this information. If you have an Apple Watch or Kardia device, please plan to have heart rate information ready on the day of your appointment. Please have a pen and paper handy nearby the day of the visit as well."  5. Give patient instructions for MyChart download to smartphone OR Doximity/Doxy.me as below if video visit (depending on what platform provider is using)  6. Inform patient they will receive a phone call 15 minutes prior to their appointment time (may be from unknown caller ID) so they should be prepared to answer    TELEPHONE CALL NOTE  Megan [name] has been deemed a candidate for a follow-up tele-health visit to limit community exposure during the Covid-19 pandemic. I spoke with the patient via phone to ensure availability of phone/video source, confirm preferred email & phone number, and discuss instructions and expectations.  I reminded Megan [name] to be prepared with any vital sign and/or heart rhythm information that could potentially be obtained via home monitoring, at the time of her visit. I reminded  Megan [name] to expect a phone call prior to her visit.  Howie Ill 03/14/2019 1:41 PM   INSTRUCTIONS FOR DOWNLOADING THE MYCHART APP TO SMARTPHONE  - The patient must first make sure to have activated MyChart and know their login information - If Apple, go to [employer] and type in MyChart in the search bar and download the app. If Android, ask patient to go to [employer] and type in [place] in the search bar and download the app. The app is free but as with any other app downloads, their phone may require them to  verify saved payment information or Apple/Android password.  - The patient will need to then log into the app with their MyChart username and password, and select [facility] as their healthcare provider to link the account. When it is time for your visit, go to the MyChart app, find appointments, and click Begin Video Visit. Be sure to Select Allow for your device to access the Microphone and Camera for your visit. You will then be connected, and your provider will be with you shortly.  **If they have any issues connecting, or need assistance please contact MyChart service desk [phone])**  **If using a computer, in order to ensure the best quality for their visit they will need to use either of the following Internet Browsers: [employer], or Google Chrome**  IF USING DOXIMITY or DOXY.ME - The patient will receive a link just prior to their visit by text.     FULL LENGTH CONSENT FOR TELE-HEALTH VISIT   I hereby voluntarily request, consent and authorize [facility] and its employed or contracted physicians, physician assistants, nurse practitioners or other licensed health care professionals (the Practitioner), to provide me with telemedicine health care services (the Services") as deemed necessary by the treating Practitioner. I acknowledge and consent to receive the Services by the Practitioner via telemedicine. I understand that the telemedicine visit will involve communicating with the Practitioner through live audiovisual communication technology and the disclosure of certain medical information by electronic transmission. I acknowledge that I have been given the opportunity to request an in-person assessment or other available alternative prior to the telemedicine visit and am voluntarily participating in the telemedicine visit.  I understand that I have the right to withhold or withdraw my consent to the use of telemedicine in the course of my care at any time,  without affecting my right to future care or treatment, and that the Practitioner or I may terminate the telemedicine visit at any time. I understand that I have the right to inspect all information obtained and/or recorded in the course of the telemedicine visit and may receive copies of available information for a reasonable fee.  I understand that some of the potential risks of receiving the Services via telemedicine include:   Delay or interruption in medical evaluation due to technological equipment failure or disruption;  Information transmitted may not be sufficient (e.g. poor resolution of images) to allow for appropriate medical decision making by the Practitioner; and/or   In rare instances, security protocols could fail, causing a breach of personal health information.  Furthermore, I acknowledge that it is my responsibility to provide information about my medical history, conditions and care that is complete and accurate to the best of my ability. I acknowledge that Practitioner's advice, recommendations, and/or decision may be based on factors not within their control, such as incomplete or inaccurate data provided by me or distortions of diagnostic  images or specimens that may result from electronic transmissions. I understand that the practice of medicine is not an exact science and that Practitioner makes no warranties or guarantees regarding treatment outcomes. I acknowledge that I will receive a copy of this consent concurrently upon execution via email to the email address I last provided but may also request a printed copy by calling the office of [facility].    I understand that my insurance will be billed for this visit.   I have read or had this consent read to me.  I understand the contents of this consent, which adequately explains the benefits and risks of the Services being provided via telemedicine.   I have been provided ample opportunity to ask questions regarding  this consent and the Services and have had my questions answered to my satisfaction.  I give my informed consent for the services to be provided through the use of telemedicine in my medical care  By participating in this telemedicine visit I agree to the above.

## 2019-03-15 DIAGNOSIS — N2581 Secondary hyperparathyroidism of renal origin: Secondary | ICD-10-CM | POA: Diagnosis not present

## 2019-03-15 DIAGNOSIS — E119 Type 2 diabetes mellitus without complications: Secondary | ICD-10-CM | POA: Diagnosis not present

## 2019-03-15 DIAGNOSIS — N186 End stage renal disease: Secondary | ICD-10-CM | POA: Diagnosis not present

## 2019-03-15 DIAGNOSIS — N040 Nephrotic syndrome with minor glomerular abnormality: Secondary | ICD-10-CM | POA: Diagnosis not present

## 2019-03-15 DIAGNOSIS — D509 Iron deficiency anemia, unspecified: Secondary | ICD-10-CM | POA: Diagnosis not present

## 2019-03-15 DIAGNOSIS — E1129 Type 2 diabetes mellitus with other diabetic kidney complication: Secondary | ICD-10-CM | POA: Diagnosis not present

## 2019-03-15 DIAGNOSIS — D631 Anemia in chronic kidney disease: Secondary | ICD-10-CM | POA: Diagnosis not present

## 2019-03-15 DIAGNOSIS — Z992 Dependence on renal dialysis: Secondary | ICD-10-CM | POA: Diagnosis not present

## 2019-03-17 ENCOUNTER — Ambulatory Visit (INDEPENDENT_AMBULATORY_CARE_PROVIDER_SITE_OTHER): Payer: Medicare Other | Admitting: Family Medicine

## 2019-03-17 ENCOUNTER — Other Ambulatory Visit: Payer: Self-pay

## 2019-03-17 VITALS — Wt 166.8 lb

## 2019-03-17 DIAGNOSIS — I200 Unstable angina: Secondary | ICD-10-CM | POA: Diagnosis not present

## 2019-03-17 DIAGNOSIS — R05 Cough: Secondary | ICD-10-CM | POA: Diagnosis not present

## 2019-03-17 DIAGNOSIS — R059 Cough, unspecified: Secondary | ICD-10-CM

## 2019-03-17 MED ORDER — BENZONATATE 200 MG PO CAPS: 200.0000 mg | ORAL_CAPSULE | Freq: Three times a day (TID) | ORAL | 0 refills | Status: DC | PRN

## 2019-03-17 NOTE — Progress Notes (Signed)
Subjective:    Patient ID: Megan [name], female    DOB: 08/01/1961, 58 y.o.   MRN: [id number]  HPI  Patient is being seen today as a telephone visit.  She consents to be seen by telephone.  She is currently at home.  I am currently my office.  Patient is a very pleasant 58 year old African-American female who has a past medical history of end-stage renal disease hemodialysis dependent.  She is currently on a Tuesday, Thursday, Saturday dialysis schedule.  She states that her normal dry weight is around 74.8 kg.  Saturday when she went to dialysis, she was under her dry weight.  Therefore they did not pull off any additional fluid, but they only "clean my blood".  This morning she awoke with a rattle in her chest.  She states the last time she heard this rattling when she breathes, there was fluid in her lungs and she went to the hospital.  This was in June.  Chest x-ray from June 22 does show mild interstitial edema.  I reviewed her nephrologist consultation at the hospital at that time and her reported weight was 74.8 kg.  He believe that she was mildly fluid overloaded.  Today the patient weighs 166 pounds on her scale at home.  That is 75.7 kg which is roughly 1 kg higher than her dry weight.  She denies any fever.  She denies any significant shortness of breath.  She denies any paroxysmal nocturnal dyspnea.  She denies any orthopnea.  She denies any chest pain.  She denies any purulent sputum.  She does report a cough that she has had for the last 2 weeks off and on however this is not changed.  The rattling in her chest just began this morning.  She has been tested for COVID-19 several times most recently in mid July and all times have been negative. Past Medical History:  Diagnosis Date  . (HFpEF) heart failure with preserved ejection fraction ([place])    a. 01/2019 Echo: EF 55-60%, mild conc LVH. DD.  Torn MV chordae.  . Anemia   . Atypical chest pain    a. 08/2018 MV: EF 59%, no ischemia; b.  02/2019 Cath: nonobs dzs.  . Blood transfusion without reported diagnosis   . Cataract   . ESRD (end stage renal disease) on dialysis ([place])    a. HD T, T, S  . Essential hypertension, benign   . GERD (gastroesophageal reflux disease)   . Headache   . Hemorrhoids   . Mixed hyperlipidemia   . Morbid obesity ([place])   . Non-obstructive CAD (coronary artery disease)    a. 02/2019 CathL LM nl, LAD 27m, LCX nl, RCA 25p, EF 55-65%.  Marland Kitchen PONV (postoperative nausea and vomiting)   . S/P colonoscopy Jan 2011   Dr. Benson Norway: sessile polyp (benign lymphoid), large hemorrhoids, repeat 5-10 years  . Temporal arteritis ([place])   . Type 2 diabetes mellitus ([place])   . Wears glasses    Past Surgical History:  Procedure Laterality Date  . ABDOMINAL HYSTERECTOMY    . APPENDECTOMY    . ARTERY BIOPSY N/A 05/09/2018   Procedure: RIGHT TEMPORAL ARTERY BIOPSY;  Surgeon: Judeth Horn, MD;  Location: [facility];  Service: General;  Laterality: N/A;  . CATARACT EXTRACTION W/PHACO Left 02/09/2017   Procedure: CATARACT EXTRACTION PHACO AND INTRAOCULAR LENS PLACEMENT LEFT EYE;  Surgeon: Tonny Branch, MD;  Location: [facility];  Service: Ophthalmology;  Laterality: Left;  CDE: 4.89  . CATARACT  EXTRACTION W/PHACO Right 06/04/2017   Procedure: CATARACT EXTRACTION PHACO AND INTRAOCULAR LENS PLACEMENT (IOC);  Surgeon: Tonny Branch, MD;  Location: [facility];  Service: Ophthalmology;  Laterality: Right;  CDE: 4.12  . CHOLECYSTECTOMY  09/29/2011   Procedure: LAPAROSCOPIC CHOLECYSTECTOMY;  Surgeon: Jamesetta So, MD;  Location: [facility];  Service: General;  Laterality: N/A;  . COLONOSCOPY  Jan 2011   Dr. Benson Norway: sessile polyp (benign lymphoid), large hemorrhoids, repeat 5-10 years  . COLONOSCOPY N/A 06/12/2016   Procedure: COLONOSCOPY;  Surgeon: Daneil Dolin, MD;  [facility];  Service: Endoscopy;  Laterality: N/A;  1230   . ESOPHAGOGASTRODUODENOSCOPY  09/05/2011   XBD:ZHGDJ hiatal hernia; remainder of exam normal. No explanation for  patient's abdominal pain with today's examination  . ESOPHAGOGASTRODUODENOSCOPY N/A 12/17/2013   Dr. [name]: gastric erythema, erosion, mild chronic inflammation on path   . LAPAROSCOPIC APPENDECTOMY  09/29/2011   Procedure: APPENDECTOMY LAPAROSCOPIC;  Surgeon: Jamesetta So, MD;  Location: [facility];  Service: General;;  incidental appendectomy  . LEFT HEART CATH AND CORONARY ANGIOGRAPHY N/A 02/28/2019   Procedure: LEFT HEART CATH AND CORONARY ANGIOGRAPHY;  Surgeon: Jettie Booze, MD;  Location: [facility];  Service: Cardiovascular;  Laterality: N/A;  . PARTIAL MASTECTOMY WITH NEEDLE LOCALIZATION AND AXILLARY SENTINEL LYMPH NODE BX Right 09/18/2018   Procedure: RIGHT PARTIAL MASTECTOMY AFTER NEEDLE LOCALIZATION, SENTINEL LYMPH NODE BIOPSY RIGHT AXILLA;  Surgeon: Aviva Signs, MD;  Location: [facility];  Service: General;  Laterality: Right;   Current Outpatient Medications on File Prior to Visit  Medication Sig Dispense Refill  . amLODipine (NORVASC) 5 MG tablet Take 1 tablet (5 mg total) by mouth daily. 90 tablet 3  . anastrozole (ARIMIDEX) 1 MG tablet Take 1 tablet (1 mg total) by mouth daily. 30 tablet 4  . aspirin EC 81 MG tablet Take 81 mg by mouth daily.    Marland Kitchen atorvastatin (LIPITOR) 20 MG tablet Take 20 mg by mouth daily.    . B Complex-C-Folic Acid (DIALYVITE 242) 0.8 MG TABS Take 800 mg by mouth daily.     . butalbital-acetaminophen-caffeine (FIORICET, ESGIC) 50-325-40 MG tablet Take 1 tablet by mouth every 6 (six) hours as needed for headache. 20 tablet 0  . Calcium Carb-Cholecalciferol (CALCIUM 1000 + D PO) Take 1 tablet by mouth 2 (two) times a day.     . cinacalcet (SENSIPAR) 30 MG tablet Take 2 tablets (60 mg total) by mouth daily. (Patient taking differently: Take 30 mg by mouth daily. ) 90 tablet 0  . gabapentin (NEURONTIN) 300 MG capsule TAKE 1 CAPSULE(300 MG) BY MOUTH AT BEDTIME 30 capsule 3  . Investigational vitamin D 600 UNITS capsule SWOG S0812 Take 600 Units by mouth  daily. Take with food.    . lidocaine-prilocaine (EMLA) cream APPLY SMALL AMOUNT TO ACCESS SITE (AVF) 1 TO 2 HOURS BEFORE DIALYSIS. COVER WITH OCCLUSIVE DRESSING (SARAN WRAP)    . losartan (COZAAR) 50 MG tablet TAKE 1 TABLET DAILY (DISCONTINUE LOSARTAN 100 MG) 90 tablet 3  . nebivolol (BYSTOLIC) 10 MG tablet Take 1 tablet (10 mg total) by mouth 2 (two) times daily. (Patient taking differently: Take 10 mg by mouth daily. ) 180 tablet 3  . nitroGLYCERIN (NITROSTAT) 0.4 MG SL tablet Place 1 tablet (0.4 mg total) under the tongue every 5 (five) minutes as needed for chest pain. 25 tablet 3  . pantoprazole (PROTONIX) 40 MG tablet TAKE 1 TABLET(40 MG) BY MOUTH DAILY 90 tablet 3  . tiZANidine (  ZANAFLEX) 4 MG capsule TAKE 1 CAPSULE(4 MG) BY MOUTH TWICE DAILY WITH A MEAL. START 1 CAPSULE AT NIGHT FOR 2 WEEKS AND THEN TWICE DAILY (Patient taking differently: Take 4 mg by mouth 2 (two) times daily as needed. ) 60 capsule 0   No current facility-administered medications on file prior to visit.    Allergies  Allergen Reactions  . Hydrocodone     itching   Social History   Socioeconomic History  . Marital status: Married    Spouse name: Not on file  . Number of children: Not on file  . Years of education: Not on file  . Highest education level: Not on file  Occupational History  . Occupation: Merchandiser, retail: [employer] # [phone]  Social Needs  . Financial resource strain: Not hard at all  . Food insecurity    Worry: Never true    Inability: Never true  . Transportation needs    Medical: No    Non-medical: No  Tobacco Use  . Smoking status: Never Smoker  . Smokeless tobacco: Never Used  Substance and Sexual Activity  . Alcohol use: No  . Drug use: No  . Sexual activity: Yes    Birth control/protection: Surgical  Lifestyle  . Physical activity    Days per week: 0 days    Minutes per session: 0 min  . Stress: Not at all  Relationships  . Social Herbalist on phone: Twice  a week    Gets together: Twice a week    Attends religious service: More than 4 times per year    Active member of club or organization: No    Attends meetings of clubs or organizations: Never    Relationship status: Married  . Intimate partner violence    Fear of current or ex partner: No    Emotionally abused: No    Physically abused: No    Forced sexual activity: No  Other Topics Concern  . Not on file  Social History Narrative   Works at [employer] in [place].    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   Also has to get items from high shelves-causes achy pain in shoulder area      Married.   Children are grown, out of house.     Review of Systems  All other systems reviewed and are negative.      Objective:   Physical Exam  No physical exam could be performed today as patient was seen as a telephone visit.  However she is speaking full and complete sentences over the telephone today in no respiratory distress.  She does not sound winded.  I do not hear any audible wheezing.  I do not hear any audible congestion.  She seems comfortable on the telephone.      Assessment & Plan:  The encounter diagnosis was Cough. Differential diagnosis includes mild interstitial edema secondary to fluid overload versus mucus and congestion from an upper respiratory infection.  Based on her weight and her previous hospitalization, I believe the patient may have mild interstitial edema.  As I explained to the patient I cannot give her diuretic to help with this because she is hemodialysis dependent.  The only way to remove additional fluid would be through dialysis.  The question is whether she requires hospitalization and dialysis today or if she can wait for her regularly scheduled dialysis tomorrow morning at 5:30 in  the morning.  She denies any emergency symptoms that warrant dialysis right now.  She feels comfortable.  She denies any chest pain or shortness of breath.  Therefore  plan will be to wait until the morning when she will go to her regularly scheduled dialysis session.  If she develops shortness of breath she can go to the emergency room sooner however we would like to try to avoid that if possible.  Is also possible that the rattling she has hearing in her lungs could be mucus and upper airway congestion so I will give her Tessalon Perles 200 mg every 8 hours as needed for cough.

## 2019-03-18 DIAGNOSIS — Z992 Dependence on renal dialysis: Secondary | ICD-10-CM | POA: Diagnosis not present

## 2019-03-18 DIAGNOSIS — D631 Anemia in chronic kidney disease: Secondary | ICD-10-CM | POA: Diagnosis not present

## 2019-03-18 DIAGNOSIS — N186 End stage renal disease: Secondary | ICD-10-CM | POA: Diagnosis not present

## 2019-03-18 DIAGNOSIS — E1129 Type 2 diabetes mellitus with other diabetic kidney complication: Secondary | ICD-10-CM | POA: Diagnosis not present

## 2019-03-18 DIAGNOSIS — N2581 Secondary hyperparathyroidism of renal origin: Secondary | ICD-10-CM | POA: Diagnosis not present

## 2019-03-18 DIAGNOSIS — D509 Iron deficiency anemia, unspecified: Secondary | ICD-10-CM | POA: Diagnosis not present

## 2019-03-19 NOTE — Progress Notes (Signed)
Virtual Visit via Telephone Note   This visit type was conducted due to national recommendations for restrictions regarding the COVID-19 Pandemic (e.g. social distancing) in an effort to limit this patient's exposure and mitigate transmission in our community.  Due to her co-morbid illnesses, this patient is at least at moderate risk for complications without adequate follow up.  This format is felt to be most appropriate for this patient at this time.  The patient did not have access to video technology/had technical difficulties with video requiring transitioning to audio format only (telephone).  All issues noted in this document were discussed and addressed.  No physical exam could be performed with this format.  Please refer to the patient's chart for her  consent to telehealth for [facility].  Evaluation Performed:  Follow-up visit  This visit type was conducted due to national recommendations for restrictions regarding the COVID-19 Pandemic (e.g. social distancing).  This format is felt to be most appropriate for this patient at this time.  All issues noted in this document were discussed and addressed.  No physical exam was performed (except for noted visual exam findings with Video Visits).  Please refer to the patient's chart (MyChart message for video visits and phone note for telephone visits) for the patient's consent to telehealth for [facility].  Date:  03/20/2019   ID:  Megan [name], DOB [id number], MRN [id number]  Patient Location:  Home  Provider location:   Office  PCP:  [name], MD  Cardiologist:  Carlyle Dolly, MD 05/03/2017 Electrophysiologist:  None   Chief Complaint:  Hospital follow up  History of Present Illness:    Megan [name] is a 58 y.o. female who presents via audio/video conferencing for a telehealth visit today.    58 y.o. yo female who has a hx of HTN, HLD, ESRD on HD, GERD, breast CA s/p R mastectomy 09/2018, hx  CP w/  non-obs dz at cath 02/28/2019  07/16 office visit, pt c/o CP>>cardiac CT planned CP worsened and pt came to ER, admitted 07/17-07/18 for CP, cath with RCA 25%, LAD 10% dz  Ms. Well has done well since the heart catheterization.  She has not had any more episodes of chest pain.  She never misses dialysis appointments, and never comes off early.  She still has dyspnea on exertion, but part of it may be due to general weakness.  She has just completed chemotherapy and that would make her very weak.  She had dialysis today and her blood pressure was very high at the end of it.  She came home and her blood pressure was a little bit better.  She had a severe headache and took some medicine for it that is making her sleepy.  Part of the conversation was with her husband, Marquette Piontek.   He feels her breathing has been doing better, he is making sure she is compliant with fluid restrictions and takes her medications as prescribed.  He is a little frustrated because he feels not all of the dialysis doctors are on the same page.  They keep changing things and the patient struggles with this.  She has completed chemotherapy for her breast cancer, that has reduced some of her stress.  She was getting chemotherapy 3 days a week and dialysis 4 days a week.  The patient does not have symptoms concerning for COVID-19 infection (fever, chills, cough, or new shortness of breath).    Prior  CV studies:   The following studies were reviewed today:  CARDIAC CATH: 02/27/2019  The left ventricular systolic function is normal.  LV end diastolic pressure is mildly elevated.  The left ventricular ejection fraction is 55-65% by visual estimate.  There is no aortic valve stenosis.  Nonobstructive CAD. RCA 25%, LAD 10%  Nonobstructive CAD.  Continue RF modification including BP control.   ECHO: 02/03/2019  1. The left ventricle has normal systolic function, with an ejection fraction of  55-60%. The cavity size was normal. There is mild concentric left ventricular hypertrophy. Left ventricular diastolic Doppler parameters are consistent with  pseudonormalization. Elevated left ventricular end-diastolic pressure.  2. The right ventricle has normal systolic function. The cavity was normal. There is no increase in right ventricular wall thickness.  3. The mitral valve is grossly normal. Mild thickening of the mitral valve leaflet.  4. Torn mitral valve chordae.  5. The tricuspid valve is grossly normal.  6. The aortic valve is grossly normal.  7. The aortic root is normal in size and structure.  8. The inferior vena cava was dilated in size with >50% respiratory variability.   Past Medical History:  Diagnosis Date  . (HFpEF) heart failure with preserved ejection fraction ([place])    a. 01/2019 Echo: EF 55-60%, mild conc LVH. DD.  Torn MV chordae.  . Anemia   . Atypical chest pain    a. 08/2018 MV: EF 59%, no ischemia; b. 02/2019 Cath: nonobs dzs.  . Blood transfusion without reported diagnosis   . Cataract   . ESRD (end stage renal disease) on dialysis ([place])    a. HD T, T, S  . Essential hypertension, benign   . GERD (gastroesophageal reflux disease)   . Headache   . Hemorrhoids   . Mixed hyperlipidemia   . Morbid obesity ([place])   . Non-obstructive CAD (coronary artery disease)    a. 02/2019 CathL LM nl, LAD 78m, LCX nl, RCA 25p, EF 55-65%.  Marland Kitchen PONV (postoperative nausea and vomiting)   . S/P colonoscopy Jan 2011   Dr. Benson Norway: sessile polyp (benign lymphoid), large hemorrhoids, repeat 5-10 years  . Temporal arteritis ([place])   . Type 2 diabetes mellitus ([place])   . Wears glasses    Past Surgical History:  Procedure Laterality Date  . ABDOMINAL HYSTERECTOMY    . APPENDECTOMY    . ARTERY BIOPSY N/A 05/09/2018   Procedure: RIGHT TEMPORAL ARTERY BIOPSY;  Surgeon: Judeth Horn, MD;  Location: [place];  Service: General;  Laterality: N/A;  . CATARACT EXTRACTION W/PHACO Left 02/09/2017    Procedure: CATARACT EXTRACTION PHACO AND INTRAOCULAR LENS PLACEMENT LEFT EYE;  Surgeon: Tonny Branch, MD;  Location: [facility];  Service: Ophthalmology;  Laterality: Left;  CDE: 4.89  . CATARACT EXTRACTION W/PHACO Right 06/04/2017   Procedure: CATARACT EXTRACTION PHACO AND INTRAOCULAR LENS PLACEMENT (IOC);  Surgeon: Tonny Branch, MD;  Location: [facility];  Service: Ophthalmology;  Laterality: Right;  CDE: 4.12  . CHOLECYSTECTOMY  09/29/2011   Procedure: LAPAROSCOPIC CHOLECYSTECTOMY;  Surgeon: Jamesetta So, MD;  Location: [facility];  Service: General;  Laterality: N/A;  . COLONOSCOPY  Jan 2011   Dr. Benson Norway: sessile polyp (benign lymphoid), large hemorrhoids, repeat 5-10 years  . COLONOSCOPY N/A 06/12/2016   Procedure: COLONOSCOPY;  Surgeon: Daneil Dolin, MD;  [facility];  Service: Endoscopy;  Laterality: N/A;  1230   . ESOPHAGOGASTRODUODENOSCOPY  09/05/2011   JSE:GBTDV hiatal hernia; remainder of exam normal. No explanation for patient's abdominal  pain with today's examination  . ESOPHAGOGASTRODUODENOSCOPY N/A 12/17/2013   Dr. [name]: gastric erythema, erosion, mild chronic inflammation on path   . LAPAROSCOPIC APPENDECTOMY  09/29/2011   Procedure: APPENDECTOMY LAPAROSCOPIC;  Surgeon: Jamesetta So, MD;  Location: [facility];  Service: General;;  incidental appendectomy  . LEFT HEART CATH AND CORONARY ANGIOGRAPHY N/A 02/28/2019   Procedure: LEFT HEART CATH AND CORONARY ANGIOGRAPHY;  Surgeon: Jettie Booze, MD;  Location: [place] [facility];  Service: Cardiovascular;  Laterality: N/A;  . PARTIAL MASTECTOMY WITH NEEDLE LOCALIZATION AND AXILLARY SENTINEL LYMPH NODE BX Right 09/18/2018   Procedure: RIGHT PARTIAL MASTECTOMY AFTER NEEDLE LOCALIZATION, SENTINEL LYMPH NODE BIOPSY RIGHT AXILLA;  Surgeon: Aviva Signs, MD;  Location: [facility];  Service: General;  Laterality: Right;     Current Meds  Medication Sig  . amLODipine (NORVASC) 5 MG tablet Take 1 tablet (5 mg total) by mouth daily.  Marland Kitchen  anastrozole (ARIMIDEX) 1 MG tablet Take 1 tablet (1 mg total) by mouth daily.  Marland Kitchen aspirin EC 81 MG tablet Take 81 mg by mouth daily.  Marland Kitchen atorvastatin (LIPITOR) 20 MG tablet Take 20 mg by mouth daily.  . B Complex-C-Folic Acid (DIALYVITE 099) 0.8 MG TABS Take 800 mg by mouth daily.   . benzonatate (TESSALON) 200 MG capsule Take 1 capsule (200 mg total) by mouth 3 (three) times daily as needed for cough.  . butalbital-acetaminophen-caffeine (FIORICET, ESGIC) 50-325-40 MG tablet Take 1 tablet by mouth every 6 (six) hours as needed for headache.  . Calcium Carb-Cholecalciferol (CALCIUM 1000 + D PO) Take 1 tablet by mouth 2 (two) times a day.   . cinacalcet (SENSIPAR) 30 MG tablet Take 2 tablets (60 mg total) by mouth daily. (Patient taking differently: Take 30 mg by mouth daily. )  . gabapentin (NEURONTIN) 300 MG capsule TAKE 1 CAPSULE(300 MG) BY MOUTH AT BEDTIME  . Investigational vitamin D 600 UNITS capsule SWOG S0812 Take 600 Units by mouth daily. Take with food.  . lidocaine-prilocaine (EMLA) cream APPLY SMALL AMOUNT TO ACCESS SITE (AVF) 1 TO 2 HOURS BEFORE DIALYSIS. COVER WITH OCCLUSIVE DRESSING (SARAN WRAP)  . losartan (COZAAR) 50 MG tablet TAKE 1 TABLET DAILY (DISCONTINUE LOSARTAN 100 MG)  . nebivolol (BYSTOLIC) 10 MG tablet Take 1 tablet (10 mg total) by mouth 2 (two) times daily. (Patient taking differently: Take 10 mg by mouth daily. )  . nitroGLYCERIN (NITROSTAT) 0.4 MG SL tablet Place 1 tablet (0.4 mg total) under the tongue every 5 (five) minutes as needed for chest pain.  . pantoprazole (PROTONIX) 40 MG tablet TAKE 1 TABLET(40 MG) BY MOUTH DAILY  . tiZANidine (ZANAFLEX) 4 MG capsule TAKE 1 CAPSULE(4 MG) BY MOUTH TWICE DAILY WITH A MEAL. START 1 CAPSULE AT NIGHT FOR 2 WEEKS AND THEN TWICE DAILY (Patient taking differently: Take 4 mg by mouth 2 (two) times daily as needed. )     Allergies:   Hydrocodone   Social History   Tobacco Use  . Smoking status: Never Smoker  . Smokeless tobacco:  Never Used  Substance Use Topics  . Alcohol use: No  . Drug use: No     Family Hx: The patient's family history includes Coronary artery disease in her mother and sister; Diabetes in her maternal aunt, maternal uncle, mother, paternal aunt, and paternal uncle; Heart attack in her father, maternal aunt, maternal grandfather, maternal grandmother, maternal uncle, paternal aunt, paternal grandfather, paternal grandmother, and paternal uncle; Hypertension in her brother, maternal aunt, maternal uncle, mother, paternal  aunt, paternal uncle, sister, and son. There is no history of Colon cancer.  ROS:   Please see the history of present illness.    All other systems reviewed and are negative.   Labs/Other Tests and Data Reviewed:    Recent Labs: 02/03/2019: TSH 1.837 02/24/2019: ALT 62; B Natriuretic Peptide >4,500.0 02/28/2019: Hemoglobin 9.8; Platelets 472 03/01/2019: BUN 56; Creatinine, Ser 6.94; Potassium 4.3; Sodium 138   CBC    Component Value Date/Time   WBC 11.8 (H) 02/28/2019 0846   RBC 3.08 (L) 02/28/2019 0846   HGB 9.8 (L) 02/28/2019 0846   HCT 32.9 (L) 02/28/2019 0846   PLT 472 (H) 02/28/2019 0846   MCV 106.8 (H) 02/28/2019 0846   MCH 31.8 02/28/2019 0846   MCHC 29.8 (L) 02/28/2019 0846   RDW 19.6 (H) 02/28/2019 0846   LYMPHSABS 1.0 02/24/2019 0945   MONOABS 1.5 (H) 02/24/2019 0945   EOSABS 0.0 02/24/2019 0945   BASOSABS 0.0 02/24/2019 0945    CMP Latest Ref Rng & Units 03/01/2019 02/28/2019 02/24/2019  Glucose 70 - 99 mg/dL 101(H) 126(H) 157(H)  BUN 6 - 20 mg/dL 56(H) 47(H) 84(H)  Creatinine 0.44 - 1.00 mg/dL 6.94(H) 5.70(H) 7.00(H)  Sodium 135 - 145 mmol/L 138 139 138  Potassium 3.5 - 5.1 mmol/L 4.3 4.1 4.8  Chloride 98 - 111 mmol/L 97(L) 96(L) 94(L)  CO2 22 - 32 mmol/L 26 28 27   Calcium 8.9 - 10.3 mg/dL 8.5(L) 8.9 9.2  Total Protein 6.5 - 8.1 g/dL - - 7.0  Total Bilirubin 0.3 - 1.2 mg/dL - - 0.9  Alkaline Phos 38 - 126 U/L - - 166(H)  AST 15 - 41 U/L - - 47(H)   ALT 0 - 44 U/L - - 62(H)     Recent Lipid Panel Lab Results  Component Value Date/Time   CHOL 76 03/01/2019 03:20 AM   TRIG 80 03/01/2019 03:20 AM   HDL 31 (L) 03/01/2019 03:20 AM   CHOLHDL 2.5 03/01/2019 03:20 AM   LDLCALC 29 03/01/2019 03:20 AM    Wt Readings from Last 3 Encounters:  03/20/19 166 lb (75.3 kg)  03/17/19 166 lb 12.8 oz (75.7 kg)  03/01/19 166 lb 7.2 oz (75.5 kg)     Objective:    Vital Signs:  BP (!) 161/87   Pulse 94   Ht 5\' 3"  (1.6 m)   Wt 166 lb (75.3 kg)   BMI 29.41 kg/m    58 y.o. female in no acute distress.   ASSESSMENT & PLAN:    1.  Chest pain: -Symptoms have resolved.  I broached the possibility that the chest pain may have been related to very high blood pressures. -If she has more episodes of chest pain, they are encouraged to check her blood pressure and make sure that is okay.  2.  Hypertension: - Her medications have been frequently adjusted and reduced -She was previously on amlodipine 10 mg, losartan 100 mg, and clonidine - Due to low blood pressures during/after dialysis, the amlodipine was reduced and the other 2 medications were discontinued. -Now she is having problems with high blood pressures. - I will change the amlodipine to 5 mg x 2 on nondialysis days, and 5 mg on dialysis days.  Mr. Leamy states he will make sure that she takes this as directed. -Otherwise, per PCP and Nephrologist -She still has some clonidine tablets and takes them at times when her blood pressure is very high.  It is okay to do this when  her systolic blood pressures greater than 160  3.  ESRD on HD - She never misses appointments even though they have to get up at 3:30 in the morning to make her 530 dialysis appointment, and never comes off early -Her husband is working with her on being compliant with fluid and dietary restrictions - She has struggled with weight gain between appointments, leading to emergency room at other visits   COVID-19  Education: The signs and symptoms of COVID-19 were discussed with the patient and how to seek care for testing (follow up with PCP or arrange E-visit).  The importance of social distancing was discussed today.  Patient Risk:   After full review of this patient's clinical status, I feel that they are at least moderate risk at this time.  Time:   Today, I have spent 14 minutes with the patient with telehealth technology discussing cardiology and other health issues.     Medication Adjustments/Labs and Tests Ordered: Current medicines are reviewed at length with the patient today.  Concerns regarding medicines are outlined above.  Tests Ordered: No orders of the defined types were placed in this encounter.  Medication Changes: No orders of the defined types were placed in this encounter.   Disposition:  Follow up with Carlyle Dolly, MD   Signed, Rosaria Ferries, PA-C  03/20/2019 1:38 PM    [place]

## 2019-03-20 ENCOUNTER — Telehealth (INDEPENDENT_AMBULATORY_CARE_PROVIDER_SITE_OTHER): Payer: Medicare Other | Admitting: Physician Assistant

## 2019-03-20 ENCOUNTER — Other Ambulatory Visit: Payer: Self-pay

## 2019-03-20 ENCOUNTER — Encounter: Payer: Self-pay | Admitting: Physician Assistant

## 2019-03-20 VITALS — BP 161/87 | HR 94 | Ht 63.0 in | Wt 166.0 lb

## 2019-03-20 DIAGNOSIS — I10 Essential (primary) hypertension: Secondary | ICD-10-CM

## 2019-03-20 DIAGNOSIS — Z992 Dependence on renal dialysis: Secondary | ICD-10-CM | POA: Diagnosis not present

## 2019-03-20 DIAGNOSIS — D509 Iron deficiency anemia, unspecified: Secondary | ICD-10-CM | POA: Diagnosis not present

## 2019-03-20 DIAGNOSIS — I200 Unstable angina: Secondary | ICD-10-CM

## 2019-03-20 DIAGNOSIS — N2581 Secondary hyperparathyroidism of renal origin: Secondary | ICD-10-CM | POA: Diagnosis not present

## 2019-03-20 DIAGNOSIS — D631 Anemia in chronic kidney disease: Secondary | ICD-10-CM | POA: Diagnosis not present

## 2019-03-20 DIAGNOSIS — E1129 Type 2 diabetes mellitus with other diabetic kidney complication: Secondary | ICD-10-CM | POA: Diagnosis not present

## 2019-03-20 DIAGNOSIS — R079 Chest pain, unspecified: Secondary | ICD-10-CM | POA: Diagnosis not present

## 2019-03-20 DIAGNOSIS — N186 End stage renal disease: Secondary | ICD-10-CM

## 2019-03-20 MED ORDER — AMLODIPINE BESYLATE 5 MG PO TABS: ORAL_TABLET | ORAL | 6 refills | Status: DC

## 2019-03-20 NOTE — Patient Instructions (Signed)
Medication Instructions:  Your physician has recommended you make the following change in your medication:  Norvasc 5 mg Take 2 on Nondialysis days and take 1 on Dialysis days. Ok to take Clonidine 0.1 mg for systolic blood pressure greater than 160. Please contact PCP for refills.   If you need a refill on your cardiac medications before your next appointment, please call your pharmacy.   Lab work: NONE  If you have labs (blood work) drawn today and your tests are completely normal, you will receive your results only by: Marland Kitchen MyChart Message (if you have MyChart) OR . A paper copy in the mail If you have any lab test that is abnormal or we need to change your treatment, we will call you to review the results.  Testing/Procedures: NONE   Follow-Up: At [facility], you and your health needs are our priority.  As part of our continuing mission to provide you with exceptional heart care, we have created designated Provider Care Teams.  These Care Teams include your primary Cardiologist (physician) and Advanced Practice Providers (APPs -  Physician Assistants and Nurse Practitioners) who all work together to provide you with the care you need, when you need it. You will need a follow up appointment in 3 months.  Please call our office 2 months in advance to schedule this appointment.  You may see Carlyle Dolly, MD or one of the following Advanced Practice Providers on your designated Care Team:   Bernerd Pho, PA-C [facility]) . Ermalinda Barrios, PA-C ([place])  Any Other Special Instructions Will Be Listed Below (If Applicable). Thank you for choosing [facility]!

## 2019-03-22 DIAGNOSIS — N186 End stage renal disease: Secondary | ICD-10-CM | POA: Diagnosis not present

## 2019-03-22 DIAGNOSIS — N2581 Secondary hyperparathyroidism of renal origin: Secondary | ICD-10-CM | POA: Diagnosis not present

## 2019-03-22 DIAGNOSIS — D631 Anemia in chronic kidney disease: Secondary | ICD-10-CM | POA: Diagnosis not present

## 2019-03-22 DIAGNOSIS — D509 Iron deficiency anemia, unspecified: Secondary | ICD-10-CM | POA: Diagnosis not present

## 2019-03-22 DIAGNOSIS — E1129 Type 2 diabetes mellitus with other diabetic kidney complication: Secondary | ICD-10-CM | POA: Diagnosis not present

## 2019-03-22 DIAGNOSIS — Z992 Dependence on renal dialysis: Secondary | ICD-10-CM | POA: Diagnosis not present

## 2019-03-25 DIAGNOSIS — E1129 Type 2 diabetes mellitus with other diabetic kidney complication: Secondary | ICD-10-CM | POA: Diagnosis not present

## 2019-03-25 DIAGNOSIS — N186 End stage renal disease: Secondary | ICD-10-CM | POA: Diagnosis not present

## 2019-03-25 DIAGNOSIS — N2581 Secondary hyperparathyroidism of renal origin: Secondary | ICD-10-CM | POA: Diagnosis not present

## 2019-03-25 DIAGNOSIS — D631 Anemia in chronic kidney disease: Secondary | ICD-10-CM | POA: Diagnosis not present

## 2019-03-25 DIAGNOSIS — D509 Iron deficiency anemia, unspecified: Secondary | ICD-10-CM | POA: Diagnosis not present

## 2019-03-25 DIAGNOSIS — Z992 Dependence on renal dialysis: Secondary | ICD-10-CM | POA: Diagnosis not present

## 2019-03-26 DIAGNOSIS — N186 End stage renal disease: Secondary | ICD-10-CM | POA: Diagnosis not present

## 2019-03-26 DIAGNOSIS — N2581 Secondary hyperparathyroidism of renal origin: Secondary | ICD-10-CM | POA: Diagnosis not present

## 2019-03-26 DIAGNOSIS — D631 Anemia in chronic kidney disease: Secondary | ICD-10-CM | POA: Diagnosis not present

## 2019-03-26 DIAGNOSIS — Z992 Dependence on renal dialysis: Secondary | ICD-10-CM | POA: Diagnosis not present

## 2019-03-26 DIAGNOSIS — E1129 Type 2 diabetes mellitus with other diabetic kidney complication: Secondary | ICD-10-CM | POA: Diagnosis not present

## 2019-03-26 DIAGNOSIS — D509 Iron deficiency anemia, unspecified: Secondary | ICD-10-CM | POA: Diagnosis not present

## 2019-03-27 ENCOUNTER — Other Ambulatory Visit: Payer: Self-pay | Admitting: Family Medicine

## 2019-03-27 ENCOUNTER — Telehealth: Payer: Self-pay | Admitting: Family Medicine

## 2019-03-27 NOTE — Telephone Encounter (Signed)
She is allergic to hydrocodone so other option is delsym.

## 2019-03-27 NOTE — Telephone Encounter (Signed)
Pt called and states that the tessalon keeps her up at night and makes her jittery and would like something called in for her cough.

## 2019-03-28 NOTE — Telephone Encounter (Signed)
Pt aware of recommendations

## 2019-03-29 DIAGNOSIS — Z992 Dependence on renal dialysis: Secondary | ICD-10-CM | POA: Diagnosis not present

## 2019-03-29 DIAGNOSIS — N2581 Secondary hyperparathyroidism of renal origin: Secondary | ICD-10-CM | POA: Diagnosis not present

## 2019-03-29 DIAGNOSIS — E1129 Type 2 diabetes mellitus with other diabetic kidney complication: Secondary | ICD-10-CM | POA: Diagnosis not present

## 2019-03-29 DIAGNOSIS — D509 Iron deficiency anemia, unspecified: Secondary | ICD-10-CM | POA: Diagnosis not present

## 2019-03-29 DIAGNOSIS — N186 End stage renal disease: Secondary | ICD-10-CM | POA: Diagnosis not present

## 2019-03-29 DIAGNOSIS — D631 Anemia in chronic kidney disease: Secondary | ICD-10-CM | POA: Diagnosis not present

## 2019-04-01 DIAGNOSIS — Z992 Dependence on renal dialysis: Secondary | ICD-10-CM | POA: Diagnosis not present

## 2019-04-01 DIAGNOSIS — N186 End stage renal disease: Secondary | ICD-10-CM | POA: Diagnosis not present

## 2019-04-01 DIAGNOSIS — N2581 Secondary hyperparathyroidism of renal origin: Secondary | ICD-10-CM | POA: Diagnosis not present

## 2019-04-01 DIAGNOSIS — E1129 Type 2 diabetes mellitus with other diabetic kidney complication: Secondary | ICD-10-CM | POA: Diagnosis not present

## 2019-04-01 DIAGNOSIS — D631 Anemia in chronic kidney disease: Secondary | ICD-10-CM | POA: Diagnosis not present

## 2019-04-01 DIAGNOSIS — D509 Iron deficiency anemia, unspecified: Secondary | ICD-10-CM | POA: Diagnosis not present

## 2019-04-02 ENCOUNTER — Telehealth: Payer: Self-pay | Admitting: Cardiology

## 2019-04-02 NOTE — Telephone Encounter (Signed)
New Message   Patient states that she missed a call from [place]. No notes in the system are reflecting. Please give patient a call back .

## 2019-04-03 ENCOUNTER — Encounter (HOSPITAL_COMMUNITY): Payer: Medicare Other

## 2019-04-03 DIAGNOSIS — E1129 Type 2 diabetes mellitus with other diabetic kidney complication: Secondary | ICD-10-CM | POA: Diagnosis not present

## 2019-04-03 DIAGNOSIS — N2581 Secondary hyperparathyroidism of renal origin: Secondary | ICD-10-CM | POA: Diagnosis not present

## 2019-04-03 DIAGNOSIS — D509 Iron deficiency anemia, unspecified: Secondary | ICD-10-CM | POA: Diagnosis not present

## 2019-04-03 DIAGNOSIS — Z992 Dependence on renal dialysis: Secondary | ICD-10-CM | POA: Diagnosis not present

## 2019-04-03 DIAGNOSIS — D631 Anemia in chronic kidney disease: Secondary | ICD-10-CM | POA: Diagnosis not present

## 2019-04-03 DIAGNOSIS — N186 End stage renal disease: Secondary | ICD-10-CM | POA: Diagnosis not present

## 2019-04-03 NOTE — Telephone Encounter (Signed)
Called pt. No answer. I am not sure who called. I do not see a note.

## 2019-04-05 DIAGNOSIS — D509 Iron deficiency anemia, unspecified: Secondary | ICD-10-CM | POA: Diagnosis not present

## 2019-04-05 DIAGNOSIS — D631 Anemia in chronic kidney disease: Secondary | ICD-10-CM | POA: Diagnosis not present

## 2019-04-05 DIAGNOSIS — E1129 Type 2 diabetes mellitus with other diabetic kidney complication: Secondary | ICD-10-CM | POA: Diagnosis not present

## 2019-04-05 DIAGNOSIS — N186 End stage renal disease: Secondary | ICD-10-CM | POA: Diagnosis not present

## 2019-04-05 DIAGNOSIS — Z992 Dependence on renal dialysis: Secondary | ICD-10-CM | POA: Diagnosis not present

## 2019-04-05 DIAGNOSIS — N2581 Secondary hyperparathyroidism of renal origin: Secondary | ICD-10-CM | POA: Diagnosis not present

## 2019-04-08 DIAGNOSIS — D631 Anemia in chronic kidney disease: Secondary | ICD-10-CM | POA: Diagnosis not present

## 2019-04-08 DIAGNOSIS — N2581 Secondary hyperparathyroidism of renal origin: Secondary | ICD-10-CM | POA: Diagnosis not present

## 2019-04-08 DIAGNOSIS — N186 End stage renal disease: Secondary | ICD-10-CM | POA: Diagnosis not present

## 2019-04-08 DIAGNOSIS — E1129 Type 2 diabetes mellitus with other diabetic kidney complication: Secondary | ICD-10-CM | POA: Diagnosis not present

## 2019-04-08 DIAGNOSIS — Z992 Dependence on renal dialysis: Secondary | ICD-10-CM | POA: Diagnosis not present

## 2019-04-08 DIAGNOSIS — D509 Iron deficiency anemia, unspecified: Secondary | ICD-10-CM | POA: Diagnosis not present

## 2019-04-10 DIAGNOSIS — N186 End stage renal disease: Secondary | ICD-10-CM | POA: Diagnosis not present

## 2019-04-10 DIAGNOSIS — D631 Anemia in chronic kidney disease: Secondary | ICD-10-CM | POA: Diagnosis not present

## 2019-04-10 DIAGNOSIS — N2581 Secondary hyperparathyroidism of renal origin: Secondary | ICD-10-CM | POA: Diagnosis not present

## 2019-04-10 DIAGNOSIS — Z992 Dependence on renal dialysis: Secondary | ICD-10-CM | POA: Diagnosis not present

## 2019-04-10 DIAGNOSIS — E1129 Type 2 diabetes mellitus with other diabetic kidney complication: Secondary | ICD-10-CM | POA: Diagnosis not present

## 2019-04-10 DIAGNOSIS — D509 Iron deficiency anemia, unspecified: Secondary | ICD-10-CM | POA: Diagnosis not present

## 2019-04-12 DIAGNOSIS — E1129 Type 2 diabetes mellitus with other diabetic kidney complication: Secondary | ICD-10-CM | POA: Diagnosis not present

## 2019-04-12 DIAGNOSIS — D631 Anemia in chronic kidney disease: Secondary | ICD-10-CM | POA: Diagnosis not present

## 2019-04-12 DIAGNOSIS — N2581 Secondary hyperparathyroidism of renal origin: Secondary | ICD-10-CM | POA: Diagnosis not present

## 2019-04-12 DIAGNOSIS — D509 Iron deficiency anemia, unspecified: Secondary | ICD-10-CM | POA: Diagnosis not present

## 2019-04-12 DIAGNOSIS — N186 End stage renal disease: Secondary | ICD-10-CM | POA: Diagnosis not present

## 2019-04-12 DIAGNOSIS — Z992 Dependence on renal dialysis: Secondary | ICD-10-CM | POA: Diagnosis not present

## 2019-04-15 DIAGNOSIS — E1129 Type 2 diabetes mellitus with other diabetic kidney complication: Secondary | ICD-10-CM | POA: Diagnosis not present

## 2019-04-15 DIAGNOSIS — N2581 Secondary hyperparathyroidism of renal origin: Secondary | ICD-10-CM | POA: Diagnosis not present

## 2019-04-15 DIAGNOSIS — N186 End stage renal disease: Secondary | ICD-10-CM | POA: Diagnosis not present

## 2019-04-15 DIAGNOSIS — Z23 Encounter for immunization: Secondary | ICD-10-CM | POA: Diagnosis not present

## 2019-04-15 DIAGNOSIS — D631 Anemia in chronic kidney disease: Secondary | ICD-10-CM | POA: Diagnosis not present

## 2019-04-15 DIAGNOSIS — N040 Nephrotic syndrome with minor glomerular abnormality: Secondary | ICD-10-CM | POA: Diagnosis not present

## 2019-04-15 DIAGNOSIS — E119 Type 2 diabetes mellitus without complications: Secondary | ICD-10-CM | POA: Diagnosis not present

## 2019-04-15 DIAGNOSIS — Z992 Dependence on renal dialysis: Secondary | ICD-10-CM | POA: Diagnosis not present

## 2019-04-15 DIAGNOSIS — D509 Iron deficiency anemia, unspecified: Secondary | ICD-10-CM | POA: Diagnosis not present

## 2019-04-17 DIAGNOSIS — N186 End stage renal disease: Secondary | ICD-10-CM | POA: Diagnosis not present

## 2019-04-17 DIAGNOSIS — E1129 Type 2 diabetes mellitus with other diabetic kidney complication: Secondary | ICD-10-CM | POA: Diagnosis not present

## 2019-04-17 DIAGNOSIS — Z992 Dependence on renal dialysis: Secondary | ICD-10-CM | POA: Diagnosis not present

## 2019-04-17 DIAGNOSIS — N2581 Secondary hyperparathyroidism of renal origin: Secondary | ICD-10-CM | POA: Diagnosis not present

## 2019-04-17 DIAGNOSIS — D509 Iron deficiency anemia, unspecified: Secondary | ICD-10-CM | POA: Diagnosis not present

## 2019-04-17 DIAGNOSIS — D631 Anemia in chronic kidney disease: Secondary | ICD-10-CM | POA: Diagnosis not present

## 2019-04-19 DIAGNOSIS — D631 Anemia in chronic kidney disease: Secondary | ICD-10-CM | POA: Diagnosis not present

## 2019-04-19 DIAGNOSIS — N186 End stage renal disease: Secondary | ICD-10-CM | POA: Diagnosis not present

## 2019-04-19 DIAGNOSIS — Z992 Dependence on renal dialysis: Secondary | ICD-10-CM | POA: Diagnosis not present

## 2019-04-19 DIAGNOSIS — N2581 Secondary hyperparathyroidism of renal origin: Secondary | ICD-10-CM | POA: Diagnosis not present

## 2019-04-19 DIAGNOSIS — D509 Iron deficiency anemia, unspecified: Secondary | ICD-10-CM | POA: Diagnosis not present

## 2019-04-19 DIAGNOSIS — E1129 Type 2 diabetes mellitus with other diabetic kidney complication: Secondary | ICD-10-CM | POA: Diagnosis not present

## 2019-04-22 DIAGNOSIS — Z992 Dependence on renal dialysis: Secondary | ICD-10-CM | POA: Diagnosis not present

## 2019-04-22 DIAGNOSIS — N186 End stage renal disease: Secondary | ICD-10-CM | POA: Diagnosis not present

## 2019-04-22 DIAGNOSIS — E1129 Type 2 diabetes mellitus with other diabetic kidney complication: Secondary | ICD-10-CM | POA: Diagnosis not present

## 2019-04-22 DIAGNOSIS — N2581 Secondary hyperparathyroidism of renal origin: Secondary | ICD-10-CM | POA: Diagnosis not present

## 2019-04-22 DIAGNOSIS — D631 Anemia in chronic kidney disease: Secondary | ICD-10-CM | POA: Diagnosis not present

## 2019-04-22 DIAGNOSIS — D509 Iron deficiency anemia, unspecified: Secondary | ICD-10-CM | POA: Diagnosis not present

## 2019-04-24 DIAGNOSIS — D509 Iron deficiency anemia, unspecified: Secondary | ICD-10-CM | POA: Diagnosis not present

## 2019-04-24 DIAGNOSIS — D631 Anemia in chronic kidney disease: Secondary | ICD-10-CM | POA: Diagnosis not present

## 2019-04-24 DIAGNOSIS — Z992 Dependence on renal dialysis: Secondary | ICD-10-CM | POA: Diagnosis not present

## 2019-04-24 DIAGNOSIS — E1129 Type 2 diabetes mellitus with other diabetic kidney complication: Secondary | ICD-10-CM | POA: Diagnosis not present

## 2019-04-24 DIAGNOSIS — N186 End stage renal disease: Secondary | ICD-10-CM | POA: Diagnosis not present

## 2019-04-24 DIAGNOSIS — N2581 Secondary hyperparathyroidism of renal origin: Secondary | ICD-10-CM | POA: Diagnosis not present

## 2019-04-26 DIAGNOSIS — E1129 Type 2 diabetes mellitus with other diabetic kidney complication: Secondary | ICD-10-CM | POA: Diagnosis not present

## 2019-04-26 DIAGNOSIS — Z992 Dependence on renal dialysis: Secondary | ICD-10-CM | POA: Diagnosis not present

## 2019-04-26 DIAGNOSIS — D509 Iron deficiency anemia, unspecified: Secondary | ICD-10-CM | POA: Diagnosis not present

## 2019-04-26 DIAGNOSIS — N2581 Secondary hyperparathyroidism of renal origin: Secondary | ICD-10-CM | POA: Diagnosis not present

## 2019-04-26 DIAGNOSIS — D631 Anemia in chronic kidney disease: Secondary | ICD-10-CM | POA: Diagnosis not present

## 2019-04-26 DIAGNOSIS — N186 End stage renal disease: Secondary | ICD-10-CM | POA: Diagnosis not present

## 2019-04-29 DIAGNOSIS — N2581 Secondary hyperparathyroidism of renal origin: Secondary | ICD-10-CM | POA: Diagnosis not present

## 2019-04-29 DIAGNOSIS — D631 Anemia in chronic kidney disease: Secondary | ICD-10-CM | POA: Diagnosis not present

## 2019-04-29 DIAGNOSIS — E1129 Type 2 diabetes mellitus with other diabetic kidney complication: Secondary | ICD-10-CM | POA: Diagnosis not present

## 2019-04-29 DIAGNOSIS — Z992 Dependence on renal dialysis: Secondary | ICD-10-CM | POA: Diagnosis not present

## 2019-04-29 DIAGNOSIS — N186 End stage renal disease: Secondary | ICD-10-CM | POA: Diagnosis not present

## 2019-04-29 DIAGNOSIS — D509 Iron deficiency anemia, unspecified: Secondary | ICD-10-CM | POA: Diagnosis not present

## 2019-04-30 DIAGNOSIS — T886XXA Anaphylactic reaction due to adverse effect of correct drug or medicament properly administered, initial encounter: Secondary | ICD-10-CM | POA: Insufficient documentation

## 2019-05-01 DIAGNOSIS — E1129 Type 2 diabetes mellitus with other diabetic kidney complication: Secondary | ICD-10-CM | POA: Diagnosis not present

## 2019-05-01 DIAGNOSIS — Z992 Dependence on renal dialysis: Secondary | ICD-10-CM | POA: Diagnosis not present

## 2019-05-01 DIAGNOSIS — N2581 Secondary hyperparathyroidism of renal origin: Secondary | ICD-10-CM | POA: Diagnosis not present

## 2019-05-01 DIAGNOSIS — D631 Anemia in chronic kidney disease: Secondary | ICD-10-CM | POA: Diagnosis not present

## 2019-05-01 DIAGNOSIS — N186 End stage renal disease: Secondary | ICD-10-CM | POA: Diagnosis not present

## 2019-05-01 DIAGNOSIS — D509 Iron deficiency anemia, unspecified: Secondary | ICD-10-CM | POA: Diagnosis not present

## 2019-05-03 DIAGNOSIS — D631 Anemia in chronic kidney disease: Secondary | ICD-10-CM | POA: Diagnosis not present

## 2019-05-03 DIAGNOSIS — N2581 Secondary hyperparathyroidism of renal origin: Secondary | ICD-10-CM | POA: Diagnosis not present

## 2019-05-03 DIAGNOSIS — D509 Iron deficiency anemia, unspecified: Secondary | ICD-10-CM | POA: Diagnosis not present

## 2019-05-03 DIAGNOSIS — Z992 Dependence on renal dialysis: Secondary | ICD-10-CM | POA: Diagnosis not present

## 2019-05-03 DIAGNOSIS — N186 End stage renal disease: Secondary | ICD-10-CM | POA: Diagnosis not present

## 2019-05-03 DIAGNOSIS — E1129 Type 2 diabetes mellitus with other diabetic kidney complication: Secondary | ICD-10-CM | POA: Diagnosis not present

## 2019-05-06 DIAGNOSIS — E1129 Type 2 diabetes mellitus with other diabetic kidney complication: Secondary | ICD-10-CM | POA: Diagnosis not present

## 2019-05-06 DIAGNOSIS — D631 Anemia in chronic kidney disease: Secondary | ICD-10-CM | POA: Diagnosis not present

## 2019-05-06 DIAGNOSIS — Z992 Dependence on renal dialysis: Secondary | ICD-10-CM | POA: Diagnosis not present

## 2019-05-06 DIAGNOSIS — N2581 Secondary hyperparathyroidism of renal origin: Secondary | ICD-10-CM | POA: Diagnosis not present

## 2019-05-06 DIAGNOSIS — N186 End stage renal disease: Secondary | ICD-10-CM | POA: Diagnosis not present

## 2019-05-06 DIAGNOSIS — D509 Iron deficiency anemia, unspecified: Secondary | ICD-10-CM | POA: Diagnosis not present

## 2019-05-08 DIAGNOSIS — N2581 Secondary hyperparathyroidism of renal origin: Secondary | ICD-10-CM | POA: Diagnosis not present

## 2019-05-08 DIAGNOSIS — N186 End stage renal disease: Secondary | ICD-10-CM | POA: Diagnosis not present

## 2019-05-08 DIAGNOSIS — Z992 Dependence on renal dialysis: Secondary | ICD-10-CM | POA: Diagnosis not present

## 2019-05-08 DIAGNOSIS — E1129 Type 2 diabetes mellitus with other diabetic kidney complication: Secondary | ICD-10-CM | POA: Diagnosis not present

## 2019-05-08 DIAGNOSIS — D509 Iron deficiency anemia, unspecified: Secondary | ICD-10-CM | POA: Diagnosis not present

## 2019-05-08 DIAGNOSIS — D631 Anemia in chronic kidney disease: Secondary | ICD-10-CM | POA: Diagnosis not present

## 2019-05-10 DIAGNOSIS — E1129 Type 2 diabetes mellitus with other diabetic kidney complication: Secondary | ICD-10-CM | POA: Diagnosis not present

## 2019-05-10 DIAGNOSIS — N186 End stage renal disease: Secondary | ICD-10-CM | POA: Diagnosis not present

## 2019-05-10 DIAGNOSIS — N2581 Secondary hyperparathyroidism of renal origin: Secondary | ICD-10-CM | POA: Diagnosis not present

## 2019-05-10 DIAGNOSIS — D509 Iron deficiency anemia, unspecified: Secondary | ICD-10-CM | POA: Diagnosis not present

## 2019-05-10 DIAGNOSIS — D631 Anemia in chronic kidney disease: Secondary | ICD-10-CM | POA: Diagnosis not present

## 2019-05-10 DIAGNOSIS — Z992 Dependence on renal dialysis: Secondary | ICD-10-CM | POA: Diagnosis not present

## 2019-05-12 DIAGNOSIS — E1129 Type 2 diabetes mellitus with other diabetic kidney complication: Secondary | ICD-10-CM | POA: Diagnosis not present

## 2019-05-12 DIAGNOSIS — D509 Iron deficiency anemia, unspecified: Secondary | ICD-10-CM | POA: Diagnosis not present

## 2019-05-12 DIAGNOSIS — N186 End stage renal disease: Secondary | ICD-10-CM | POA: Diagnosis not present

## 2019-05-12 DIAGNOSIS — D631 Anemia in chronic kidney disease: Secondary | ICD-10-CM | POA: Diagnosis not present

## 2019-05-12 DIAGNOSIS — Z992 Dependence on renal dialysis: Secondary | ICD-10-CM | POA: Diagnosis not present

## 2019-05-12 DIAGNOSIS — N2581 Secondary hyperparathyroidism of renal origin: Secondary | ICD-10-CM | POA: Diagnosis not present

## 2019-05-13 ENCOUNTER — Other Ambulatory Visit: Payer: Self-pay | Admitting: Family Medicine

## 2019-05-15 DIAGNOSIS — N2581 Secondary hyperparathyroidism of renal origin: Secondary | ICD-10-CM | POA: Diagnosis not present

## 2019-05-15 DIAGNOSIS — E119 Type 2 diabetes mellitus without complications: Secondary | ICD-10-CM | POA: Diagnosis not present

## 2019-05-15 DIAGNOSIS — E1129 Type 2 diabetes mellitus with other diabetic kidney complication: Secondary | ICD-10-CM | POA: Diagnosis not present

## 2019-05-15 DIAGNOSIS — E875 Hyperkalemia: Secondary | ICD-10-CM | POA: Diagnosis not present

## 2019-05-15 DIAGNOSIS — Z992 Dependence on renal dialysis: Secondary | ICD-10-CM | POA: Diagnosis not present

## 2019-05-15 DIAGNOSIS — E876 Hypokalemia: Secondary | ICD-10-CM | POA: Diagnosis not present

## 2019-05-15 DIAGNOSIS — E8770 Fluid overload, unspecified: Secondary | ICD-10-CM | POA: Diagnosis not present

## 2019-05-15 DIAGNOSIS — D631 Anemia in chronic kidney disease: Secondary | ICD-10-CM | POA: Diagnosis not present

## 2019-05-15 DIAGNOSIS — N040 Nephrotic syndrome with minor glomerular abnormality: Secondary | ICD-10-CM | POA: Diagnosis not present

## 2019-05-15 DIAGNOSIS — N186 End stage renal disease: Secondary | ICD-10-CM | POA: Diagnosis not present

## 2019-05-17 DIAGNOSIS — N2581 Secondary hyperparathyroidism of renal origin: Secondary | ICD-10-CM | POA: Diagnosis not present

## 2019-05-17 DIAGNOSIS — Z992 Dependence on renal dialysis: Secondary | ICD-10-CM | POA: Diagnosis not present

## 2019-05-17 DIAGNOSIS — N186 End stage renal disease: Secondary | ICD-10-CM | POA: Diagnosis not present

## 2019-05-17 DIAGNOSIS — D631 Anemia in chronic kidney disease: Secondary | ICD-10-CM | POA: Diagnosis not present

## 2019-05-17 DIAGNOSIS — E119 Type 2 diabetes mellitus without complications: Secondary | ICD-10-CM | POA: Diagnosis not present

## 2019-05-17 DIAGNOSIS — E1129 Type 2 diabetes mellitus with other diabetic kidney complication: Secondary | ICD-10-CM | POA: Diagnosis not present

## 2019-05-20 DIAGNOSIS — N186 End stage renal disease: Secondary | ICD-10-CM | POA: Diagnosis not present

## 2019-05-20 DIAGNOSIS — D631 Anemia in chronic kidney disease: Secondary | ICD-10-CM | POA: Diagnosis not present

## 2019-05-20 DIAGNOSIS — Z992 Dependence on renal dialysis: Secondary | ICD-10-CM | POA: Diagnosis not present

## 2019-05-20 DIAGNOSIS — E1129 Type 2 diabetes mellitus with other diabetic kidney complication: Secondary | ICD-10-CM | POA: Diagnosis not present

## 2019-05-20 DIAGNOSIS — N2581 Secondary hyperparathyroidism of renal origin: Secondary | ICD-10-CM | POA: Diagnosis not present

## 2019-05-20 DIAGNOSIS — E119 Type 2 diabetes mellitus without complications: Secondary | ICD-10-CM | POA: Diagnosis not present

## 2019-05-22 DIAGNOSIS — N186 End stage renal disease: Secondary | ICD-10-CM | POA: Diagnosis not present

## 2019-05-22 DIAGNOSIS — E119 Type 2 diabetes mellitus without complications: Secondary | ICD-10-CM | POA: Diagnosis not present

## 2019-05-22 DIAGNOSIS — D631 Anemia in chronic kidney disease: Secondary | ICD-10-CM | POA: Diagnosis not present

## 2019-05-22 DIAGNOSIS — N2581 Secondary hyperparathyroidism of renal origin: Secondary | ICD-10-CM | POA: Diagnosis not present

## 2019-05-22 DIAGNOSIS — E1129 Type 2 diabetes mellitus with other diabetic kidney complication: Secondary | ICD-10-CM | POA: Diagnosis not present

## 2019-05-22 DIAGNOSIS — Z992 Dependence on renal dialysis: Secondary | ICD-10-CM | POA: Diagnosis not present

## 2019-05-24 DIAGNOSIS — E1129 Type 2 diabetes mellitus with other diabetic kidney complication: Secondary | ICD-10-CM | POA: Diagnosis not present

## 2019-05-24 DIAGNOSIS — E119 Type 2 diabetes mellitus without complications: Secondary | ICD-10-CM | POA: Diagnosis not present

## 2019-05-24 DIAGNOSIS — N2581 Secondary hyperparathyroidism of renal origin: Secondary | ICD-10-CM | POA: Diagnosis not present

## 2019-05-24 DIAGNOSIS — D631 Anemia in chronic kidney disease: Secondary | ICD-10-CM | POA: Diagnosis not present

## 2019-05-24 DIAGNOSIS — N186 End stage renal disease: Secondary | ICD-10-CM | POA: Diagnosis not present

## 2019-05-24 DIAGNOSIS — Z992 Dependence on renal dialysis: Secondary | ICD-10-CM | POA: Diagnosis not present

## 2019-05-24 DIAGNOSIS — E8770 Fluid overload, unspecified: Secondary | ICD-10-CM | POA: Insufficient documentation

## 2019-05-25 DIAGNOSIS — D631 Anemia in chronic kidney disease: Secondary | ICD-10-CM | POA: Diagnosis not present

## 2019-05-25 DIAGNOSIS — Z992 Dependence on renal dialysis: Secondary | ICD-10-CM | POA: Diagnosis not present

## 2019-05-25 DIAGNOSIS — E119 Type 2 diabetes mellitus without complications: Secondary | ICD-10-CM | POA: Diagnosis not present

## 2019-05-25 DIAGNOSIS — N2581 Secondary hyperparathyroidism of renal origin: Secondary | ICD-10-CM | POA: Diagnosis not present

## 2019-05-25 DIAGNOSIS — N186 End stage renal disease: Secondary | ICD-10-CM | POA: Diagnosis not present

## 2019-05-25 DIAGNOSIS — E1129 Type 2 diabetes mellitus with other diabetic kidney complication: Secondary | ICD-10-CM | POA: Diagnosis not present

## 2019-05-27 DIAGNOSIS — E119 Type 2 diabetes mellitus without complications: Secondary | ICD-10-CM | POA: Diagnosis not present

## 2019-05-27 DIAGNOSIS — E1129 Type 2 diabetes mellitus with other diabetic kidney complication: Secondary | ICD-10-CM | POA: Diagnosis not present

## 2019-05-27 DIAGNOSIS — N186 End stage renal disease: Secondary | ICD-10-CM | POA: Diagnosis not present

## 2019-05-27 DIAGNOSIS — Z992 Dependence on renal dialysis: Secondary | ICD-10-CM | POA: Diagnosis not present

## 2019-05-27 DIAGNOSIS — N2581 Secondary hyperparathyroidism of renal origin: Secondary | ICD-10-CM | POA: Diagnosis not present

## 2019-05-27 DIAGNOSIS — D631 Anemia in chronic kidney disease: Secondary | ICD-10-CM | POA: Diagnosis not present

## 2019-05-29 DIAGNOSIS — D631 Anemia in chronic kidney disease: Secondary | ICD-10-CM | POA: Diagnosis not present

## 2019-05-29 DIAGNOSIS — N2581 Secondary hyperparathyroidism of renal origin: Secondary | ICD-10-CM | POA: Diagnosis not present

## 2019-05-29 DIAGNOSIS — E119 Type 2 diabetes mellitus without complications: Secondary | ICD-10-CM | POA: Diagnosis not present

## 2019-05-29 DIAGNOSIS — Z992 Dependence on renal dialysis: Secondary | ICD-10-CM | POA: Diagnosis not present

## 2019-05-29 DIAGNOSIS — E1129 Type 2 diabetes mellitus with other diabetic kidney complication: Secondary | ICD-10-CM | POA: Diagnosis not present

## 2019-05-29 DIAGNOSIS — N186 End stage renal disease: Secondary | ICD-10-CM | POA: Diagnosis not present

## 2019-05-30 ENCOUNTER — Other Ambulatory Visit: Payer: Self-pay

## 2019-05-31 DIAGNOSIS — Z992 Dependence on renal dialysis: Secondary | ICD-10-CM | POA: Diagnosis not present

## 2019-05-31 DIAGNOSIS — E119 Type 2 diabetes mellitus without complications: Secondary | ICD-10-CM | POA: Diagnosis not present

## 2019-05-31 DIAGNOSIS — N2581 Secondary hyperparathyroidism of renal origin: Secondary | ICD-10-CM | POA: Diagnosis not present

## 2019-05-31 DIAGNOSIS — N186 End stage renal disease: Secondary | ICD-10-CM | POA: Diagnosis not present

## 2019-05-31 DIAGNOSIS — D631 Anemia in chronic kidney disease: Secondary | ICD-10-CM | POA: Diagnosis not present

## 2019-05-31 DIAGNOSIS — E1129 Type 2 diabetes mellitus with other diabetic kidney complication: Secondary | ICD-10-CM | POA: Diagnosis not present

## 2019-06-02 ENCOUNTER — Other Ambulatory Visit (HOSPITAL_COMMUNITY): Payer: Medicare Other

## 2019-06-02 ENCOUNTER — Inpatient Hospital Stay (HOSPITAL_BASED_OUTPATIENT_CLINIC_OR_DEPARTMENT_OTHER): Payer: Medicare Other | Admitting: Hematology

## 2019-06-02 ENCOUNTER — Ambulatory Visit (HOSPITAL_COMMUNITY): Payer: Medicare Other | Admitting: Hematology

## 2019-06-02 ENCOUNTER — Inpatient Hospital Stay (HOSPITAL_COMMUNITY): Payer: Medicare Other | Attending: Hematology

## 2019-06-02 ENCOUNTER — Other Ambulatory Visit (HOSPITAL_COMMUNITY): Payer: Self-pay | Admitting: *Deleted

## 2019-06-02 ENCOUNTER — Other Ambulatory Visit: Payer: Self-pay

## 2019-06-02 VITALS — BP 144/82 | HR 84 | Temp 97.1°F | Resp 18 | Wt 160.7 lb

## 2019-06-02 DIAGNOSIS — D649 Anemia, unspecified: Secondary | ICD-10-CM | POA: Diagnosis not present

## 2019-06-02 DIAGNOSIS — Z170 Estrogen receptor positive status [ER+]: Secondary | ICD-10-CM | POA: Diagnosis not present

## 2019-06-02 DIAGNOSIS — C50211 Malignant neoplasm of upper-inner quadrant of right female breast: Secondary | ICD-10-CM

## 2019-06-02 LAB — CBC WITH DIFFERENTIAL/PLATELET
Abs Immature Granulocytes: 0.02 10*3/uL (ref 0.00–0.07)
Basophils Absolute: 0.1 10*3/uL (ref 0.0–0.1)
Basophils Relative: 1 %
Eosinophils Absolute: 0.1 10*3/uL (ref 0.0–0.5)
Eosinophils Relative: 2 %
HCT: 39.7 % (ref 36.0–46.0)
Hemoglobin: 12.6 g/dL (ref 12.0–15.0)
Immature Granulocytes: 0 %
Lymphocytes Relative: 24 %
Lymphs Abs: 1.6 10*3/uL (ref 0.7–4.0)
MCH: 31.6 pg (ref 26.0–34.0)
MCHC: 31.7 g/dL (ref 30.0–36.0)
MCV: 99.5 fL (ref 80.0–100.0)
Monocytes Absolute: 0.8 10*3/uL (ref 0.1–1.0)
Monocytes Relative: 11 %
Neutro Abs: 4.1 10*3/uL (ref 1.7–7.7)
Neutrophils Relative %: 62 %
Platelets: 207 10*3/uL (ref 150–400)
RBC: 3.99 MIL/uL (ref 3.87–5.11)
RDW: 17.8 % — ABNORMAL HIGH (ref 11.5–15.5)
WBC: 6.7 10*3/uL (ref 4.0–10.5)
nRBC: 0 % (ref 0.0–0.2)

## 2019-06-02 LAB — COMPREHENSIVE METABOLIC PANEL
ALT: 33 U/L (ref 0–44)
AST: 24 U/L (ref 15–41)
Albumin: 3.6 g/dL (ref 3.5–5.0)
Alkaline Phosphatase: 129 U/L — ABNORMAL HIGH (ref 38–126)
Anion gap: 13 (ref 5–15)
BUN: 44 mg/dL — ABNORMAL HIGH (ref 6–20)
CO2: 24 mmol/L (ref 22–32)
Calcium: 9.1 mg/dL (ref 8.9–10.3)
Chloride: 101 mmol/L (ref 98–111)
Creatinine, Ser: 6.98 mg/dL — ABNORMAL HIGH (ref 0.44–1.00)
GFR calc Af Amer: 7 mL/min — ABNORMAL LOW (ref >60)
GFR calc non Af Amer: 6 mL/min — ABNORMAL LOW (ref >60)
Glucose, Bld: 112 mg/dL — ABNORMAL HIGH (ref 70–99)
Potassium: 3.9 mmol/L (ref 3.5–5.1)
Sodium: 138 mmol/L (ref 135–145)
Total Bilirubin: 1 mg/dL (ref 0.3–1.2)
Total Protein: 7.1 g/dL (ref 6.5–8.1)

## 2019-06-02 LAB — VITAMIN D 25 HYDROXY (VIT D DEFICIENCY, FRACTURES): Vit D, 25-Hydroxy: 46.77 ng/mL (ref 30–100)

## 2019-06-02 MED ORDER — PANTOPRAZOLE SODIUM 40 MG PO TBEC: DELAYED_RELEASE_TABLET | ORAL | 3 refills | Status: DC

## 2019-06-03 DIAGNOSIS — Z992 Dependence on renal dialysis: Secondary | ICD-10-CM | POA: Diagnosis not present

## 2019-06-03 DIAGNOSIS — N2581 Secondary hyperparathyroidism of renal origin: Secondary | ICD-10-CM | POA: Diagnosis not present

## 2019-06-03 DIAGNOSIS — E119 Type 2 diabetes mellitus without complications: Secondary | ICD-10-CM | POA: Diagnosis not present

## 2019-06-03 DIAGNOSIS — N186 End stage renal disease: Secondary | ICD-10-CM | POA: Diagnosis not present

## 2019-06-03 DIAGNOSIS — E1129 Type 2 diabetes mellitus with other diabetic kidney complication: Secondary | ICD-10-CM | POA: Diagnosis not present

## 2019-06-03 DIAGNOSIS — D631 Anemia in chronic kidney disease: Secondary | ICD-10-CM | POA: Diagnosis not present

## 2019-06-05 ENCOUNTER — Encounter: Payer: Self-pay | Admitting: Family Medicine

## 2019-06-05 ENCOUNTER — Other Ambulatory Visit: Payer: Self-pay

## 2019-06-05 ENCOUNTER — Ambulatory Visit (INDEPENDENT_AMBULATORY_CARE_PROVIDER_SITE_OTHER): Payer: Medicare Other | Admitting: Family Medicine

## 2019-06-05 VITALS — BP 156/80 | HR 90 | Temp 98.2°F | Resp 16 | Ht 62.0 in | Wt 155.0 lb

## 2019-06-05 DIAGNOSIS — N2581 Secondary hyperparathyroidism of renal origin: Secondary | ICD-10-CM | POA: Diagnosis not present

## 2019-06-05 DIAGNOSIS — N760 Acute vaginitis: Secondary | ICD-10-CM | POA: Diagnosis not present

## 2019-06-05 DIAGNOSIS — N186 End stage renal disease: Secondary | ICD-10-CM | POA: Diagnosis not present

## 2019-06-05 DIAGNOSIS — I200 Unstable angina: Secondary | ICD-10-CM

## 2019-06-05 DIAGNOSIS — E119 Type 2 diabetes mellitus without complications: Secondary | ICD-10-CM | POA: Diagnosis not present

## 2019-06-05 DIAGNOSIS — Z992 Dependence on renal dialysis: Secondary | ICD-10-CM | POA: Diagnosis not present

## 2019-06-05 DIAGNOSIS — D631 Anemia in chronic kidney disease: Secondary | ICD-10-CM | POA: Diagnosis not present

## 2019-06-05 DIAGNOSIS — Z124 Encounter for screening for malignant neoplasm of cervix: Secondary | ICD-10-CM | POA: Diagnosis not present

## 2019-06-05 DIAGNOSIS — E039 Hypothyroidism, unspecified: Secondary | ICD-10-CM | POA: Diagnosis not present

## 2019-06-05 DIAGNOSIS — E1129 Type 2 diabetes mellitus with other diabetic kidney complication: Secondary | ICD-10-CM | POA: Diagnosis not present

## 2019-06-05 LAB — WET PREP FOR TRICH, YEAST, CLUE

## 2019-06-05 MED ORDER — METRONIDAZOLE 500 MG PO TABS: 500.0000 mg | ORAL_TABLET | Freq: Two times a day (BID) | ORAL | 0 refills | Status: DC

## 2019-06-05 NOTE — Progress Notes (Signed)
Subjective:    Patient ID: Megan [name], female    DOB: 07-11-1961, 58 y.o.   MRN: [id number]  HPI  Patient is here today for 2 concerns.  #1 she would like to have a Pap smear for cervical cancer screening.  However #2 the patient reports increasing vaginal discharge.  She states that she will often stain her underwear with a clear vaginal discharge.  She denies any pelvic pain.  She denies any foul odor.  However she avoids having intercourse due to being embarrassed about the amount of vaginal discharge.  She denies any burning.  She denies any pain with intercourse.  She denies any vaginal bleeding.  On exam today she does have copious white discharge within the vagina.  There is no bleeding or atypical lesions seen.  Wet prep is positive for clue cells. Past Medical History:  Diagnosis Date  . (HFpEF) heart failure with preserved ejection fraction ([place])    a. 01/2019 Echo: EF 55-60%, mild conc LVH. DD.  Torn MV chordae.  . Anemia   . Atypical chest pain    a. 08/2018 MV: EF 59%, no ischemia; b. 02/2019 Cath: nonobs dzs.  . Blood transfusion without reported diagnosis   . Cataract   . ESRD (end stage renal disease) on dialysis ([place])    a. HD T, T, S  . Essential hypertension, benign   . GERD (gastroesophageal reflux disease)   . Headache   . Hemorrhoids   . Mixed hyperlipidemia   . Morbid obesity ([place])   . Non-obstructive CAD (coronary artery disease)    a. 02/2019 CathL LM nl, LAD 94m, LCX nl, RCA 25p, EF 55-65%.  Marland Kitchen PONV (postoperative nausea and vomiting)   . S/P colonoscopy Jan 2011   Dr. Benson Norway: sessile polyp (benign lymphoid), large hemorrhoids, repeat 5-10 years  . Temporal arteritis ([place])   . Type 2 diabetes mellitus ([place])   . Wears glasses    Past Surgical History:  Procedure Laterality Date  . ABDOMINAL HYSTERECTOMY    . APPENDECTOMY    . ARTERY BIOPSY N/A 05/09/2018   Procedure: RIGHT TEMPORAL ARTERY BIOPSY;  Surgeon: Judeth Horn, MD;  Location: [facility];  Service:  General;  Laterality: N/A;  . CATARACT EXTRACTION W/PHACO Left 02/09/2017   Procedure: CATARACT EXTRACTION PHACO AND INTRAOCULAR LENS PLACEMENT LEFT EYE;  Surgeon: Tonny Branch, MD;  [facility];  Service: Ophthalmology;  Laterality: Left;  CDE: 4.89  . CATARACT EXTRACTION W/PHACO Right 06/04/2017   Procedure: CATARACT EXTRACTION PHACO AND INTRAOCULAR LENS PLACEMENT (IOC);  Surgeon: Tonny Branch, MD;  [facility];  Service: Ophthalmology;  Laterality: Right;  CDE: 4.12  . CHOLECYSTECTOMY  09/29/2011   Procedure: LAPAROSCOPIC CHOLECYSTECTOMY;  Surgeon: Jamesetta So, MD;  Location[facility];  Service: General;  Laterality: N/A;  . COLONOSCOPY  Jan 2011   Dr. Benson Norway: sessile polyp (benign lymphoid), large hemorrhoids, repeat 5-10 years  . COLONOSCOPY N/A 06/12/2016   Procedure: COLONOSCOPY;  Surgeon: Daneil Dolin, MD;  [facility];  Service: Endoscopy;  Laterality: N/A;  1230   . ESOPHAGOGASTRODUODENOSCOPY  09/05/2011   RCB:ULAGT hiatal hernia; remainder of exam normal. No explanation for patient's abdominal pain with today's examination  . ESOPHAGOGASTRODUODENOSCOPY N/A 12/17/2013   Dr. [name]: gastric erythema, erosion, mild chronic inflammation on path   . LAPAROSCOPIC APPENDECTOMY  09/29/2011   Procedure: APPENDECTOMY LAPAROSCOPIC;  Surgeon: Jamesetta So, MD;  [facility];  Service: General;;  incidental appendectomy  . LEFT  HEART CATH AND CORONARY ANGIOGRAPHY N/A 02/28/2019   Procedure: LEFT HEART CATH AND CORONARY ANGIOGRAPHY;  Surgeon: Jettie Booze, MD;  Location: [facility];  Service: Cardiovascular;  Laterality: N/A;  . PARTIAL MASTECTOMY WITH NEEDLE LOCALIZATION AND AXILLARY SENTINEL LYMPH NODE BX Right 09/18/2018   Procedure: RIGHT PARTIAL MASTECTOMY AFTER NEEDLE LOCALIZATION, SENTINEL LYMPH NODE BIOPSY RIGHT AXILLA;  Surgeon: Aviva Signs, MD;  Location: [facility];  Service: General;  Laterality: Right;   Current Outpatient Medications on File Prior to  Visit  Medication Sig Dispense Refill  . amLODipine (NORVASC) 10 MG tablet Take 10 mg by mouth at bedtime. Take 2 tablets at bedtime.    Marland Kitchen anastrozole (ARIMIDEX) 1 MG tablet Take 1 tablet (1 mg total) by mouth daily. 30 tablet 4  . aspirin EC 81 MG tablet Take 81 mg by mouth daily.    Marland Kitchen atorvastatin (LIPITOR) 20 MG tablet TAKE 1 TABLET DAILY 90 tablet 3  . B Complex-C-Folic Acid (DIALYVITE 751) 0.8 MG TABS Take 800 mg by mouth daily.     . cinacalcet (SENSIPAR) 30 MG tablet Take 2 tablets (60 mg total) by mouth daily. 90 tablet 0  . DULoxetine (CYMBALTA) 30 MG capsule Take 30 mg by mouth daily.     Marland Kitchen gabapentin (NEURONTIN) 300 MG capsule TAKE 1 CAPSULE(300 MG) BY MOUTH AT BEDTIME 30 capsule 3  . Investigational vitamin D 600 UNITS capsule SWOG S0812 Take 600 Units by mouth daily. Take with food.    Marland Kitchen lanthanum (FOSRENOL) 1000 MG chewable tablet Chew 1,000 mg by mouth 2 (two) times daily with a meal.     . lidocaine-prilocaine (EMLA) cream APPLY SMALL AMOUNT TO ACCESS SITE (AVF) 1 TO 2 HOURS BEFORE DIALYSIS. COVER WITH OCCLUSIVE DRESSING (SARAN WRAP)    . losartan (COZAAR) 50 MG tablet TAKE 1 TABLET DAILY (DISCONTINUE LOSARTAN 100 MG) 90 tablet 3  . nebivolol (BYSTOLIC) 10 MG tablet Take 1 tablet (10 mg total) by mouth 2 (two) times daily. (Patient taking differently: Take 10 mg by mouth daily. ) 180 tablet 3  . pantoprazole (PROTONIX) 40 MG tablet TAKE 1 TABLET(40 MG) BY MOUTH DAILY 90 tablet 3  . tiZANidine (ZANAFLEX) 4 MG capsule TAKE 1 CAPSULE(4 MG) BY MOUTH TWICE DAILY WITH A MEAL. START 1 CAPSULE AT NIGHT FOR 2 WEEKS AND THEN TWICE DAILY (Patient taking differently: Take 4 mg by mouth 2 (two) times daily as needed. ) 60 capsule 0  . nitroGLYCERIN (NITROSTAT) 0.4 MG SL tablet Place 1 tablet (0.4 mg total) under the tongue every 5 (five) minutes as needed for chest pain. 25 tablet 3   No current facility-administered medications on file prior to visit.    Allergies  Allergen Reactions  .  Hydrocodone     itching   Social History   Socioeconomic History  . Marital status: Married    Spouse name: Not on file  . Number of children: Not on file  . Years of education: Not on file  . Highest education level: Not on file  Occupational History  . Occupation: Merchandiser, retail: [employer] # [phone]  Social Needs  . Financial resource strain: Not hard at all  . Food insecurity    Worry: Never true    Inability: Never true  . Transportation needs    Medical: No    Non-medical: No  Tobacco Use  . Smoking status: Never Smoker  . Smokeless tobacco: Never Used  Substance and Sexual Activity  .  Alcohol use: No  . Drug use: No  . Sexual activity: Yes    Birth control/protection: Surgical  Lifestyle  . Physical activity    Days per week: 0 days    Minutes per session: 0 min  . Stress: Not at all  Relationships  . Social Herbalist on phone: Twice a week    Gets together: Twice a week    Attends religious service: More than 4 times per year    Active member of club or organization: No    Attends meetings of clubs or organizations: Never    Relationship status: Married  . Intimate partner violence    Fear of current or ex partner: No    Emotionally abused: No    Physically abused: No    Forced sexual activity: No  Other Topics Concern  . Not on file  Social History Narrative   Works at [employer] in [place].    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   Also has to get items from high shelves-causes achy pain in shoulder area      Married.   Children are grown, out of house.     Review of Systems  All other systems reviewed and are negative.      Objective:   Physical Exam Exam conducted with a chaperone present.  Constitutional:      Appearance: Normal appearance. She is normal weight.  Cardiovascular:     Rate and Rhythm: Normal rate and regular rhythm.  Pulmonary:     Effort: Pulmonary effort is normal.     Breath sounds:  Normal breath sounds.  Genitourinary:    General: Normal vulva.     Exam position: Lithotomy position.     Labia:        Right: No rash or tenderness.        Left: No rash or tenderness.      Vagina: Vaginal discharge present.     Cervix: Normal.  Neurological:     Mental Status: She is alert.           Assessment & Plan:  Acute vaginitis - Plan: WET PREP FOR TRICH, YEAST, CLUE  Cervical cancer screening - Plan: PAP, Thin Prep w/HPV rflx HPV Type 16/18  Patient has bacterial vaginosis.  Begin Flagyl 500 mg tablets 1 p.o. twice daily for 7 days.  Pap smear was sent to pathology in a labeled container to complete her cervical cancer screening.  Reassess if no better in 10 days or sooner if worsening

## 2019-06-06 ENCOUNTER — Ambulatory Visit: Payer: Medicare Other | Admitting: Family Medicine

## 2019-06-06 LAB — PAP, TP IMAGING W/ HPV RNA, RFLX HPV TYPE 16,18/45: HPV DNA High Risk: NOT DETECTED

## 2019-06-07 DIAGNOSIS — E1129 Type 2 diabetes mellitus with other diabetic kidney complication: Secondary | ICD-10-CM | POA: Diagnosis not present

## 2019-06-07 DIAGNOSIS — N186 End stage renal disease: Secondary | ICD-10-CM | POA: Diagnosis not present

## 2019-06-07 DIAGNOSIS — Z992 Dependence on renal dialysis: Secondary | ICD-10-CM | POA: Diagnosis not present

## 2019-06-07 DIAGNOSIS — E119 Type 2 diabetes mellitus without complications: Secondary | ICD-10-CM | POA: Diagnosis not present

## 2019-06-07 DIAGNOSIS — D631 Anemia in chronic kidney disease: Secondary | ICD-10-CM | POA: Diagnosis not present

## 2019-06-07 DIAGNOSIS — N2581 Secondary hyperparathyroidism of renal origin: Secondary | ICD-10-CM | POA: Diagnosis not present

## 2019-06-09 ENCOUNTER — Ambulatory Visit: Payer: Medicare Other | Admitting: Cardiology

## 2019-06-10 DIAGNOSIS — E119 Type 2 diabetes mellitus without complications: Secondary | ICD-10-CM | POA: Diagnosis not present

## 2019-06-10 DIAGNOSIS — N186 End stage renal disease: Secondary | ICD-10-CM | POA: Diagnosis not present

## 2019-06-10 DIAGNOSIS — N2581 Secondary hyperparathyroidism of renal origin: Secondary | ICD-10-CM | POA: Diagnosis not present

## 2019-06-10 DIAGNOSIS — D631 Anemia in chronic kidney disease: Secondary | ICD-10-CM | POA: Diagnosis not present

## 2019-06-10 DIAGNOSIS — Z992 Dependence on renal dialysis: Secondary | ICD-10-CM | POA: Diagnosis not present

## 2019-06-10 DIAGNOSIS — E1129 Type 2 diabetes mellitus with other diabetic kidney complication: Secondary | ICD-10-CM | POA: Diagnosis not present

## 2019-06-11 ENCOUNTER — Telehealth: Payer: Self-pay | Admitting: Family Medicine

## 2019-06-11 NOTE — Telephone Encounter (Signed)
Pt states that the antibiotic is not helping her at all and was wondering what was the next step for the drainage.

## 2019-06-12 DIAGNOSIS — Z992 Dependence on renal dialysis: Secondary | ICD-10-CM | POA: Diagnosis not present

## 2019-06-12 DIAGNOSIS — E1129 Type 2 diabetes mellitus with other diabetic kidney complication: Secondary | ICD-10-CM | POA: Diagnosis not present

## 2019-06-12 DIAGNOSIS — E119 Type 2 diabetes mellitus without complications: Secondary | ICD-10-CM | POA: Diagnosis not present

## 2019-06-12 DIAGNOSIS — D631 Anemia in chronic kidney disease: Secondary | ICD-10-CM | POA: Diagnosis not present

## 2019-06-12 DIAGNOSIS — N2581 Secondary hyperparathyroidism of renal origin: Secondary | ICD-10-CM | POA: Diagnosis not present

## 2019-06-12 DIAGNOSIS — N186 End stage renal disease: Secondary | ICD-10-CM | POA: Diagnosis not present

## 2019-06-12 MED ORDER — CLINDAMYCIN HCL 300 MG PO CAPS: 300.0000 mg | ORAL_CAPSULE | Freq: Three times a day (TID) | ORAL | 0 refills | Status: AC

## 2019-06-12 NOTE — Telephone Encounter (Signed)
Only drainage was BV.  Could try clindamycin 300 mg 3 times a day for 1 week

## 2019-06-12 NOTE — Telephone Encounter (Signed)
Pt aware and med sent to pharm 

## 2019-06-14 DIAGNOSIS — Z992 Dependence on renal dialysis: Secondary | ICD-10-CM | POA: Diagnosis not present

## 2019-06-14 DIAGNOSIS — E1129 Type 2 diabetes mellitus with other diabetic kidney complication: Secondary | ICD-10-CM | POA: Diagnosis not present

## 2019-06-14 DIAGNOSIS — E119 Type 2 diabetes mellitus without complications: Secondary | ICD-10-CM | POA: Diagnosis not present

## 2019-06-14 DIAGNOSIS — N2581 Secondary hyperparathyroidism of renal origin: Secondary | ICD-10-CM | POA: Diagnosis not present

## 2019-06-14 DIAGNOSIS — D631 Anemia in chronic kidney disease: Secondary | ICD-10-CM | POA: Diagnosis not present

## 2019-06-14 DIAGNOSIS — N186 End stage renal disease: Secondary | ICD-10-CM | POA: Diagnosis not present

## 2019-06-15 DIAGNOSIS — N186 End stage renal disease: Secondary | ICD-10-CM | POA: Diagnosis not present

## 2019-06-15 DIAGNOSIS — Z992 Dependence on renal dialysis: Secondary | ICD-10-CM | POA: Diagnosis not present

## 2019-06-15 DIAGNOSIS — N040 Nephrotic syndrome with minor glomerular abnormality: Secondary | ICD-10-CM | POA: Diagnosis not present

## 2019-06-16 ENCOUNTER — Other Ambulatory Visit: Payer: Self-pay | Admitting: Family Medicine

## 2019-06-16 ENCOUNTER — Other Ambulatory Visit (HOSPITAL_COMMUNITY): Payer: Self-pay | Admitting: Hematology

## 2019-06-16 DIAGNOSIS — C50211 Malignant neoplasm of upper-inner quadrant of right female breast: Secondary | ICD-10-CM

## 2019-06-17 DIAGNOSIS — D631 Anemia in chronic kidney disease: Secondary | ICD-10-CM | POA: Diagnosis not present

## 2019-06-17 DIAGNOSIS — N2581 Secondary hyperparathyroidism of renal origin: Secondary | ICD-10-CM | POA: Diagnosis not present

## 2019-06-17 DIAGNOSIS — N186 End stage renal disease: Secondary | ICD-10-CM | POA: Diagnosis not present

## 2019-06-17 DIAGNOSIS — E119 Type 2 diabetes mellitus without complications: Secondary | ICD-10-CM | POA: Diagnosis not present

## 2019-06-17 DIAGNOSIS — E1129 Type 2 diabetes mellitus with other diabetic kidney complication: Secondary | ICD-10-CM | POA: Diagnosis not present

## 2019-06-17 DIAGNOSIS — Z992 Dependence on renal dialysis: Secondary | ICD-10-CM | POA: Diagnosis not present

## 2019-06-19 DIAGNOSIS — N186 End stage renal disease: Secondary | ICD-10-CM | POA: Diagnosis not present

## 2019-06-19 DIAGNOSIS — E1129 Type 2 diabetes mellitus with other diabetic kidney complication: Secondary | ICD-10-CM | POA: Diagnosis not present

## 2019-06-19 DIAGNOSIS — D631 Anemia in chronic kidney disease: Secondary | ICD-10-CM | POA: Diagnosis not present

## 2019-06-19 DIAGNOSIS — Z992 Dependence on renal dialysis: Secondary | ICD-10-CM | POA: Diagnosis not present

## 2019-06-19 DIAGNOSIS — E119 Type 2 diabetes mellitus without complications: Secondary | ICD-10-CM | POA: Diagnosis not present

## 2019-06-19 DIAGNOSIS — N2581 Secondary hyperparathyroidism of renal origin: Secondary | ICD-10-CM | POA: Diagnosis not present

## 2019-06-21 DIAGNOSIS — N2581 Secondary hyperparathyroidism of renal origin: Secondary | ICD-10-CM | POA: Diagnosis not present

## 2019-06-21 DIAGNOSIS — D631 Anemia in chronic kidney disease: Secondary | ICD-10-CM | POA: Diagnosis not present

## 2019-06-21 DIAGNOSIS — Z992 Dependence on renal dialysis: Secondary | ICD-10-CM | POA: Diagnosis not present

## 2019-06-21 DIAGNOSIS — E1129 Type 2 diabetes mellitus with other diabetic kidney complication: Secondary | ICD-10-CM | POA: Diagnosis not present

## 2019-06-21 DIAGNOSIS — E119 Type 2 diabetes mellitus without complications: Secondary | ICD-10-CM | POA: Diagnosis not present

## 2019-06-21 DIAGNOSIS — N186 End stage renal disease: Secondary | ICD-10-CM | POA: Diagnosis not present

## 2019-06-23 ENCOUNTER — Telehealth: Payer: Self-pay | Admitting: Family Medicine

## 2019-06-23 DIAGNOSIS — N898 Other specified noninflammatory disorders of vagina: Secondary | ICD-10-CM

## 2019-06-23 NOTE — Telephone Encounter (Signed)
Pt called and states that she has finished the antibx and she is still having the drainage. She wanted to know what the next step was?  (She is aware that Dr. Dennard Schaumann out of office and is in no hurry)

## 2019-06-24 DIAGNOSIS — N186 End stage renal disease: Secondary | ICD-10-CM | POA: Diagnosis not present

## 2019-06-24 DIAGNOSIS — E1129 Type 2 diabetes mellitus with other diabetic kidney complication: Secondary | ICD-10-CM | POA: Diagnosis not present

## 2019-06-24 DIAGNOSIS — N2581 Secondary hyperparathyroidism of renal origin: Secondary | ICD-10-CM | POA: Diagnosis not present

## 2019-06-24 DIAGNOSIS — E119 Type 2 diabetes mellitus without complications: Secondary | ICD-10-CM | POA: Diagnosis not present

## 2019-06-24 DIAGNOSIS — Z992 Dependence on renal dialysis: Secondary | ICD-10-CM | POA: Diagnosis not present

## 2019-06-24 DIAGNOSIS — D631 Anemia in chronic kidney disease: Secondary | ICD-10-CM | POA: Diagnosis not present

## 2019-06-24 NOTE — Telephone Encounter (Signed)
Since she has taken 2 rounds of antibiotics, I would recommend either GYN referral or she can come back here for repeat culture to make sure infection has completely cleared

## 2019-06-25 NOTE — Telephone Encounter (Signed)
Patient aware of providers recommendations and would like to go to GYN. Referral placed.

## 2019-06-26 DIAGNOSIS — N186 End stage renal disease: Secondary | ICD-10-CM | POA: Diagnosis not present

## 2019-06-26 DIAGNOSIS — Z992 Dependence on renal dialysis: Secondary | ICD-10-CM | POA: Diagnosis not present

## 2019-06-26 DIAGNOSIS — D631 Anemia in chronic kidney disease: Secondary | ICD-10-CM | POA: Diagnosis not present

## 2019-06-26 DIAGNOSIS — N2581 Secondary hyperparathyroidism of renal origin: Secondary | ICD-10-CM | POA: Diagnosis not present

## 2019-06-26 DIAGNOSIS — E1129 Type 2 diabetes mellitus with other diabetic kidney complication: Secondary | ICD-10-CM | POA: Diagnosis not present

## 2019-06-26 DIAGNOSIS — E119 Type 2 diabetes mellitus without complications: Secondary | ICD-10-CM | POA: Diagnosis not present

## 2019-06-28 DIAGNOSIS — N186 End stage renal disease: Secondary | ICD-10-CM | POA: Diagnosis not present

## 2019-06-28 DIAGNOSIS — E1129 Type 2 diabetes mellitus with other diabetic kidney complication: Secondary | ICD-10-CM | POA: Diagnosis not present

## 2019-06-28 DIAGNOSIS — N2581 Secondary hyperparathyroidism of renal origin: Secondary | ICD-10-CM | POA: Diagnosis not present

## 2019-06-28 DIAGNOSIS — Z992 Dependence on renal dialysis: Secondary | ICD-10-CM | POA: Diagnosis not present

## 2019-06-28 DIAGNOSIS — E119 Type 2 diabetes mellitus without complications: Secondary | ICD-10-CM | POA: Diagnosis not present

## 2019-06-28 DIAGNOSIS — D631 Anemia in chronic kidney disease: Secondary | ICD-10-CM | POA: Diagnosis not present

## 2019-06-30 ENCOUNTER — Ambulatory Visit: Payer: Medicare Other | Admitting: Cardiology

## 2019-07-01 DIAGNOSIS — E1129 Type 2 diabetes mellitus with other diabetic kidney complication: Secondary | ICD-10-CM | POA: Diagnosis not present

## 2019-07-01 DIAGNOSIS — E119 Type 2 diabetes mellitus without complications: Secondary | ICD-10-CM | POA: Diagnosis not present

## 2019-07-01 DIAGNOSIS — N186 End stage renal disease: Secondary | ICD-10-CM | POA: Diagnosis not present

## 2019-07-01 DIAGNOSIS — D631 Anemia in chronic kidney disease: Secondary | ICD-10-CM | POA: Diagnosis not present

## 2019-07-01 DIAGNOSIS — N2581 Secondary hyperparathyroidism of renal origin: Secondary | ICD-10-CM | POA: Diagnosis not present

## 2019-07-01 DIAGNOSIS — Z992 Dependence on renal dialysis: Secondary | ICD-10-CM | POA: Diagnosis not present

## 2019-07-03 DIAGNOSIS — D631 Anemia in chronic kidney disease: Secondary | ICD-10-CM | POA: Diagnosis not present

## 2019-07-03 DIAGNOSIS — E119 Type 2 diabetes mellitus without complications: Secondary | ICD-10-CM | POA: Diagnosis not present

## 2019-07-03 DIAGNOSIS — N186 End stage renal disease: Secondary | ICD-10-CM | POA: Diagnosis not present

## 2019-07-03 DIAGNOSIS — Z992 Dependence on renal dialysis: Secondary | ICD-10-CM | POA: Diagnosis not present

## 2019-07-03 DIAGNOSIS — E1129 Type 2 diabetes mellitus with other diabetic kidney complication: Secondary | ICD-10-CM | POA: Diagnosis not present

## 2019-07-03 DIAGNOSIS — N2581 Secondary hyperparathyroidism of renal origin: Secondary | ICD-10-CM | POA: Diagnosis not present

## 2019-07-05 DIAGNOSIS — E1129 Type 2 diabetes mellitus with other diabetic kidney complication: Secondary | ICD-10-CM | POA: Diagnosis not present

## 2019-07-05 DIAGNOSIS — Z992 Dependence on renal dialysis: Secondary | ICD-10-CM | POA: Diagnosis not present

## 2019-07-05 DIAGNOSIS — E119 Type 2 diabetes mellitus without complications: Secondary | ICD-10-CM | POA: Diagnosis not present

## 2019-07-05 DIAGNOSIS — D631 Anemia in chronic kidney disease: Secondary | ICD-10-CM | POA: Diagnosis not present

## 2019-07-05 DIAGNOSIS — N186 End stage renal disease: Secondary | ICD-10-CM | POA: Diagnosis not present

## 2019-07-05 DIAGNOSIS — N2581 Secondary hyperparathyroidism of renal origin: Secondary | ICD-10-CM | POA: Diagnosis not present

## 2019-07-07 ENCOUNTER — Telehealth: Payer: Self-pay | Admitting: Family Medicine

## 2019-07-07 ENCOUNTER — Encounter: Payer: Medicare Other | Admitting: Obstetrics and Gynecology

## 2019-07-07 ENCOUNTER — Other Ambulatory Visit: Payer: Self-pay

## 2019-07-07 DIAGNOSIS — N186 End stage renal disease: Secondary | ICD-10-CM | POA: Diagnosis not present

## 2019-07-07 DIAGNOSIS — N2581 Secondary hyperparathyroidism of renal origin: Secondary | ICD-10-CM | POA: Diagnosis not present

## 2019-07-07 DIAGNOSIS — D631 Anemia in chronic kidney disease: Secondary | ICD-10-CM | POA: Diagnosis not present

## 2019-07-07 DIAGNOSIS — E1129 Type 2 diabetes mellitus with other diabetic kidney complication: Secondary | ICD-10-CM | POA: Diagnosis not present

## 2019-07-07 DIAGNOSIS — E119 Type 2 diabetes mellitus without complications: Secondary | ICD-10-CM | POA: Diagnosis not present

## 2019-07-07 DIAGNOSIS — Z992 Dependence on renal dialysis: Secondary | ICD-10-CM | POA: Diagnosis not present

## 2019-07-07 MED ORDER — AMLODIPINE BESYLATE 10 MG PO TABS: 20.0000 mg | ORAL_TABLET | Freq: Every day | ORAL | 3 refills | Status: DC

## 2019-07-07 NOTE — Telephone Encounter (Signed)
Patient needs new rx showing dosage diffrence per pharmacy  Patient needs amlodipine  Needs it to show 2 per day  For questions call [phone] [facility] [address]  Patient is out of medication

## 2019-07-09 DIAGNOSIS — E119 Type 2 diabetes mellitus without complications: Secondary | ICD-10-CM | POA: Diagnosis not present

## 2019-07-09 DIAGNOSIS — Z992 Dependence on renal dialysis: Secondary | ICD-10-CM | POA: Diagnosis not present

## 2019-07-09 DIAGNOSIS — E1129 Type 2 diabetes mellitus with other diabetic kidney complication: Secondary | ICD-10-CM | POA: Diagnosis not present

## 2019-07-09 DIAGNOSIS — N2581 Secondary hyperparathyroidism of renal origin: Secondary | ICD-10-CM | POA: Diagnosis not present

## 2019-07-09 DIAGNOSIS — N186 End stage renal disease: Secondary | ICD-10-CM | POA: Diagnosis not present

## 2019-07-09 DIAGNOSIS — D631 Anemia in chronic kidney disease: Secondary | ICD-10-CM | POA: Diagnosis not present

## 2019-07-12 DIAGNOSIS — E119 Type 2 diabetes mellitus without complications: Secondary | ICD-10-CM | POA: Diagnosis not present

## 2019-07-12 DIAGNOSIS — N186 End stage renal disease: Secondary | ICD-10-CM | POA: Diagnosis not present

## 2019-07-12 DIAGNOSIS — N2581 Secondary hyperparathyroidism of renal origin: Secondary | ICD-10-CM | POA: Diagnosis not present

## 2019-07-12 DIAGNOSIS — D631 Anemia in chronic kidney disease: Secondary | ICD-10-CM | POA: Diagnosis not present

## 2019-07-12 DIAGNOSIS — E1129 Type 2 diabetes mellitus with other diabetic kidney complication: Secondary | ICD-10-CM | POA: Diagnosis not present

## 2019-07-12 DIAGNOSIS — Z992 Dependence on renal dialysis: Secondary | ICD-10-CM | POA: Diagnosis not present

## 2019-07-15 ENCOUNTER — Telehealth: Payer: Self-pay | Admitting: Obstetrics and Gynecology

## 2019-07-15 NOTE — Telephone Encounter (Signed)
Called patient regarding appointment scheduled in our office and advised to come alone to the visits, however, a support person, over age 57, may accompany her  to appointment if assistance is needed for safety or care concerns. Otherwise, support persons should remain outside until the visit is complete.  ° °We ask if you have had any exposure to anyone suspected or confirmed of having COVID-19 or if you are experiencing any of the following, to call and reschedule your appointment: fever, cough, shortness of breath, muscle pain, diarrhea, rash, vomiting, abdominal pain, red eye, weakness, bruising, bleeding, joint pain, or a severe headache.  ° °Please know we will ask you these questions or similar questions when you arrive for your appointment and again it’s how we are keeping everyone safe.   ° °Also,to keep you safe, please use the provided hand sanitizer when you enter the office. We are asking everyone in the office to wear a mask to help prevent the spread of °germs. If you have a mask of your own, please wear it to your appointment, if not, we are happy to provide one for you. ° °Thank you for understanding and your cooperation.  ° ° °[facility] Staff ° ° ° °

## 2019-07-16 ENCOUNTER — Encounter: Payer: Self-pay | Admitting: Obstetrics and Gynecology

## 2019-07-16 ENCOUNTER — Other Ambulatory Visit (HOSPITAL_COMMUNITY)
Admission: RE | Admit: 2019-07-16 | Discharge: 2019-07-16 | Disposition: A | Discharge status: Home / Self Care | Payer: Medicare Other | Source: Ambulatory Visit | Attending: Obstetrics and Gynecology | Admitting: Obstetrics and Gynecology

## 2019-07-16 ENCOUNTER — Other Ambulatory Visit: Payer: Self-pay

## 2019-07-16 ENCOUNTER — Ambulatory Visit (INDEPENDENT_AMBULATORY_CARE_PROVIDER_SITE_OTHER): Payer: Medicare Other | Admitting: Obstetrics and Gynecology

## 2019-07-16 VITALS — BP 145/83 | HR 80 | Ht 62.0 in | Wt 156.0 lb

## 2019-07-16 DIAGNOSIS — N898 Other specified noninflammatory disorders of vagina: Secondary | ICD-10-CM | POA: Diagnosis not present

## 2019-07-16 DIAGNOSIS — Z113 Encounter for screening for infections with a predominantly sexual mode of transmission: Secondary | ICD-10-CM | POA: Insufficient documentation

## 2019-07-16 DIAGNOSIS — I200 Unstable angina: Secondary | ICD-10-CM

## 2019-07-16 LAB — POCT WET PREP (WET MOUNT)
Trichomonas Wet Prep HPF POC: ABSENT
WBC, Wet Prep HPF POC: NEGATIVE

## 2019-07-16 NOTE — Progress Notes (Signed)
Patient ID: Megan [name], female   DOB: 1960-09-17, 58 y.o.   MRN: [id number]    [facility] Visit  @DATE @            Patient name: Megan [name] MRN [id number]  Date of birth: September 14, 1960  CC & HPI:  Megan [name] is a 58 y.o. female NEW PT no visits in years presenting today for abnormal vaginal discharge. She has not had intercourse since the discharge began. She is currently on dialysis and has tried two different antibiotics for the discharge but they have not helped. The pt notes that she has a hemorrhoid, but it does not bother her. The patient denies fever, chills or any other symptoms or complaints at this time.   ROS:  ROS + abnormal vaginal discharge + hemorrhoid used to see Dr Megan [name] and Megan Emperor PA, quit seeing them "the creams didn't help hemorrhoids" - fever - chills All systems are negative except as noted in the HPI and PMH.   Pertinent History Reviewed:   Reviewed: Medical         Past Medical History:  Diagnosis Date   (HFpEF) heart failure with preserved ejection fraction ([place])    a. 01/2019 Echo: EF 55-60%, mild conc LVH. DD.  Torn MV chordae.   Anemia    Atypical chest pain    a. 08/2018 MV: EF 59%, no ischemia; b. 02/2019 Cath: nonobs dzs.   Blood transfusion without reported diagnosis    Cataract    ESRD (end stage renal disease) on dialysis (Megan [name])    a. HD T, T, S   Essential hypertension, benign    GERD (gastroesophageal reflux disease)    Headache    Hemorrhoids    Mixed hyperlipidemia    Morbid obesity ([facility])    Non-obstructive CAD (coronary artery disease)    a. 02/2019 CathL LM nl, LAD 39m, LCX nl, RCA 25p, EF 55-65%.   PONV (postoperative nausea and vomiting)    S/P colonoscopy Jan 2011   Dr. Benson Norway: sessile polyp (benign lymphoid), large hemorrhoids, repeat 5-10 years   Temporal arteritis (Megan [name])    Type 2 diabetes mellitus (Megan [name])    Wears glasses                               Surgical Hx:    Past  Surgical History:  Procedure Laterality Date   ABDOMINAL HYSTERECTOMY     APPENDECTOMY     ARTERY BIOPSY N/A 05/09/2018   Procedure: RIGHT TEMPORAL ARTERY BIOPSY;  Surgeon: Judeth Horn, MD;  Location: [facility];  Service: General;  Laterality: N/A;   CATARACT EXTRACTION W/PHACO Left 02/09/2017   Procedure: CATARACT EXTRACTION PHACO AND INTRAOCULAR LENS PLACEMENT LEFT EYE;  Surgeon: Tonny Branch, MD;  [facility];  Service: Ophthalmology;  Laterality: Left;  CDE: 4.89   CATARACT EXTRACTION W/PHACO Right 06/04/2017   Procedure: CATARACT EXTRACTION PHACO AND INTRAOCULAR LENS PLACEMENT (IOC);  Surgeon: Tonny Branch, MD;  [facility];  Service: Ophthalmology;  Laterality: Right;  CDE: 4.12   CHOLECYSTECTOMY  09/29/2011   Procedure: LAPAROSCOPIC CHOLECYSTECTOMY;  Surgeon: Jamesetta So, MD;  [facility];  Service: General;  Laterality: N/A;   COLONOSCOPY  Jan 2011   Dr. Benson Norway: sessile polyp (benign lymphoid), large hemorrhoids, repeat 5-10 years   COLONOSCOPY N/A 06/12/2016   Procedure: COLONOSCOPY;  Surgeon: Daneil Dolin, MD;  [facility];  Service: Endoscopy;  Laterality: N/A;  1230    ESOPHAGOGASTRODUODENOSCOPY  09/05/2011   IRC:VELFY hiatal hernia; remainder of exam normal. No explanation for patient's abdominal pain with today's examination   ESOPHAGOGASTRODUODENOSCOPY N/A 12/17/2013   Dr. Gala [name]: gastric erythema, erosion, mild chronic inflammation on path    LAPAROSCOPIC APPENDECTOMY  09/29/2011   Procedure: APPENDECTOMY LAPAROSCOPIC;  Surgeon: Jamesetta So, MD;  [facility];  Service: General;;  incidental appendectomy   LEFT HEART CATH AND CORONARY ANGIOGRAPHY N/A 02/28/2019   Procedure: LEFT HEART CATH AND CORONARY ANGIOGRAPHY;  Surgeon: Jettie Booze, MD;  Location: [facility];  Service: Cardiovascular;  Laterality: N/A;   PARTIAL MASTECTOMY WITH NEEDLE LOCALIZATION AND AXILLARY SENTINEL LYMPH NODE BX Right 09/18/2018   Procedure: RIGHT  PARTIAL MASTECTOMY AFTER NEEDLE LOCALIZATION, SENTINEL LYMPH NODE BIOPSY RIGHT AXILLA;  Surgeon: Aviva Signs, MD;  [facility];  Service: General;  Laterality: Right;   Medications: Reviewed & Updated - see associated section                       Current Outpatient Medications:    amLODipine (NORVASC) 10 MG tablet, Take 2 tablets (20 mg total) by mouth at bedtime., Disp: 60 tablet, Rfl: 3   anastrozole (ARIMIDEX) 1 MG tablet, TAKE 1 TABLET(1 MG) BY MOUTH DAILY, Disp: 30 tablet, Rfl: 4   aspirin EC 81 MG tablet, Take 81 mg by mouth daily., Disp: , Rfl:    atorvastatin (LIPITOR) 20 MG tablet, TAKE 1 TABLET DAILY, Disp: 90 tablet, Rfl: 3   B Complex-C-Folic Acid (DIALYVITE 101) 0.8 MG TABS, Take 800 mg by mouth daily. , Disp: , Rfl:    cinacalcet (SENSIPAR) 30 MG tablet, Take 2 tablets (60 mg total) by mouth daily., Disp: 90 tablet, Rfl: 0   DULoxetine (CYMBALTA) 30 MG capsule, Take 30 mg by mouth daily. , Disp: , Rfl:    gabapentin (NEURONTIN) 300 MG capsule, TAKE 1 CAPSULE(300 MG) BY MOUTH AT BEDTIME, Disp: 90 capsule, Rfl: 3   lanthanum (FOSRENOL) 1000 MG chewable tablet, Chew 1,000 mg by mouth 2 (two) times daily with a meal. , Disp: , Rfl:    lidocaine-prilocaine (EMLA) cream, APPLY SMALL AMOUNT TO ACCESS SITE (AVF) 1 TO 2 HOURS BEFORE DIALYSIS. COVER WITH OCCLUSIVE DRESSING (SARAN WRAP), Disp: , Rfl:    losartan (COZAAR) 50 MG tablet, TAKE 1 TABLET DAILY (DISCONTINUE LOSARTAN 100 MG), Disp: 90 tablet, Rfl: 3   nebivolol (BYSTOLIC) 10 MG tablet, Take 1 tablet (10 mg total) by mouth daily., Disp: 90 tablet, Rfl: 0   pantoprazole (PROTONIX) 40 MG tablet, TAKE 1 TABLET(40 MG) BY MOUTH DAILY, Disp: 90 tablet, Rfl: 3   tiZANidine (ZANAFLEX) 4 MG capsule, TAKE 1 CAPSULE(4 MG) BY MOUTH TWICE DAILY WITH A MEAL. START 1 CAPSULE AT NIGHT FOR 2 WEEKS AND THEN TWICE DAILY (Patient taking differently: Take 4 mg by mouth 2 (two) times daily as needed. ), Disp: 60 capsule, Rfl: 0    nitroGLYCERIN (NITROSTAT) 0.4 MG SL tablet, Place 1 tablet (0.4 mg total) under the tongue every 5 (five) minutes as needed for chest pain., Disp: 25 tablet, Rfl: 3   Social History: Reviewed -  reports that she has never smoked. She has never used smokeless tobacco.  Objective Findings:  Vitals: Blood pressure (!) 145/83, pulse 80, height 5\' 2"  (1.575 m), weight 156 lb (70.8 kg).  PHYSICAL EXAMINATION General appearance - alert, well appearing, and in no distress, oriented to person, place, and time  and overweight Mental status - alert, oriented to person, place, and time, normal mood, behavior, speech, dress, motor activity, and thought processes, affect appropriate to mood  PELVIC Vagina - Normal, healthy appearing discharge Cervix - surgically absent Uterus - surgically absent [place] - epithelial cells only, no trich, no yeast, no clue cells Rectal - Prolapsing internal hemorrhoid   Image taken on 07/16/2019   Assessment & Plan:   A:    1. Prolapsing internal hemorrhoid with chronic drainage, photo was taken for chart 2. Normal vag secretions 3. ESRD 4.   P:  1.  Will refer pt to Dr. Milton Antawn Sison for hemorrhoid banding or consideration of surgery 2.    GC/CHL test completed today  By signing my name below, I, De Burrs, attest that this documentation has been prepared under the direction and in the presence of Jonnie Kind, MD. Electronically Signed: De Burrs, Medical Scribe. 07/16/19. 12:02 PM.  I personally performed the services described in this documentation, which was SCRIBED in my presence. The recorded information has been reviewed and considered accurate. It has been edited as necessary during review. Jonnie Kind, MD

## 2019-07-17 LAB — CERVICOVAGINAL ANCILLARY ONLY
Chlamydia: NEGATIVE
Comment: NEGATIVE
Comment: NORMAL
Neisseria Gonorrhea: NEGATIVE

## 2019-07-21 ENCOUNTER — Encounter: Payer: Self-pay | Admitting: Internal Medicine

## 2019-07-21 ENCOUNTER — Telehealth: Payer: Self-pay | Admitting: Gastroenterology

## 2019-07-21 NOTE — Telephone Encounter (Signed)
Megan [name], please arrange banding appointment with me due to symptomatic hemorrhoids. Thanks!

## 2019-07-21 NOTE — Telephone Encounter (Signed)
PATIENT SCHEDULED AND LETTER SENT  °

## 2019-07-29 NOTE — Progress Notes (Signed)
[address] [place], [place] [address]   CLINIC:  Medical Oncology/Hematology  PCP:  [name], MD [place] [id number] [phone]   REASON FOR VISIT:  Follow-up for breast cancer  CURRENT THERAPY: Medical surveillance  BRIEF ONCOLOGIC HISTORY:  Oncology History  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[place])  09/18/2018 Initial Diagnosis   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[place])   09/26/2018 Cancer Staging   Staging form: Breast, AJCC 8th Edition - Clinical stage from 09/26/2018: Stage IB (cT1c, cN0, cM0, G3, ER+, PR-, HER2-) - Signed by Derek Jack, MD on 09/26/2018   10/30/2018 -  Chemotherapy   The patient had DOXOrubicin (ADRIAMYCIN) chemo injection 108 mg, 60 mg/m2 = 108 mg, Intravenous,  Once, 4 of 4 cycles Administration: 108 mg (10/30/2018), 108 mg (11/20/2018), 108 mg (12/11/2018), 108 mg (01/01/2019) palonosetron (ALOXI) injection 0.25 mg, 0.25 mg, Intravenous,  Once, 4 of 4 cycles Administration: 0.25 mg (10/30/2018), 0.25 mg (11/20/2018), 0.25 mg (12/11/2018), 0.25 mg (01/01/2019) pegfilgrastim (NEULASTA ONPRO KIT) injection 6 mg, 6 mg, Subcutaneous, Once, 4 of 4 cycles Administration: 6 mg (10/30/2018), 6 mg (11/20/2018), 6 mg (12/11/2018), 6 mg (01/01/2019) cyclophosphamide (CYTOXAN) 820 mg in sodium chloride 0.9 % 250 mL chemo infusion, 450 mg/m2 = 820 mg (75 % of original dose 600 mg/m2), Intravenous,  Once, 4 of 4 cycles Dose modification: 450 mg/m2 (75 % of original dose 600 mg/m2, Cycle 1, Reason: Other (see comments), Comment: hemodialysis) Administration: 820 mg (10/30/2018), 820 mg (11/20/2018), 820 mg (12/11/2018), 820 mg (01/01/2019) fosaprepitant (EMEND) 150 mg, dexamethasone (DECADRON) 12 mg in sodium chloride 0.9 % 145 mL IVPB, , Intravenous,  Once, 4 of 4 cycles Administration:  (10/30/2018),  (11/20/2018),  (12/11/2018),  (01/01/2019)  for chemotherapy  treatment.       CANCER STAGING: Cancer Staging Malignant neoplasm of upper-inner quadrant of right female breast [facility]) Staging form: Breast, AJCC 8th Edition - Clinical stage from 09/26/2018: Stage IB (cT1c, cN0, cM0, G3, ER+, PR-, HER2-) - Signed by Derek Jack, MD on 09/26/2018    INTERVAL HISTORY:  Ms. Cosner 58 y.o. female presents today for follow-up.  She reports overall doing fair.  She does report mild to moderate fatigue.  She continues hemodialysis Monday Wednesday Friday.  She started anastrozole and seems to be tolerating well.  She denies any change in bowel habits.  Appetite is stable.  No weight loss.  Denies any fevers, chills, night sweats.  She is here for repeat labs and office visit.  REVIEW OF SYSTEMS:  Review of Systems  Constitutional: Positive for fatigue.  HENT:  Negative.   Eyes: Negative.   Respiratory: Negative.   Cardiovascular: Negative.   Gastrointestinal: Negative.   Endocrine: Negative.   Genitourinary: Negative.    Musculoskeletal: Positive for arthralgias.  Skin: Negative.   Neurological: Negative.   Hematological: Negative.   Psychiatric/Behavioral: Negative.      PAST MEDICAL/SURGICAL HISTORY:  Past Medical History:  Diagnosis Date  . (HFpEF) heart failure with preserved ejection fraction ([place])    a. 01/2019 Echo: EF 55-60%, mild conc LVH. DD.  Torn MV chordae.  . Anemia   . Atypical chest pain    a. 08/2018 MV: EF 59%, no ischemia; b. 02/2019 Cath: nonobs dzs.  . Blood transfusion without reported diagnosis   . Cataract   . ESRD (end stage renal disease) on dialysis ([place])    a. HD  T, T, S  . Essential hypertension, benign   . GERD (gastroesophageal reflux disease)   . Headache   . Hemorrhoids   . Mixed hyperlipidemia   . Morbid obesity ([place])   . Non-obstructive CAD (coronary artery disease)    a. 02/2019 CathL LM nl, LAD 79m LCX nl, RCA 25p, EF 55-65%.  .Marland KitchenPONV (postoperative nausea and vomiting)   . S/P colonoscopy  Jan 2011   Dr. HBenson Norway sessile polyp (benign lymphoid), large hemorrhoids, repeat 5-10 years  . Temporal arteritis ([place]   . Type 2 diabetes mellitus ([place]   . Wears glasses    Past Surgical History:  Procedure Laterality Date  . ABDOMINAL HYSTERECTOMY    . APPENDECTOMY    . ARTERY BIOPSY N/A 05/09/2018   Procedure: RIGHT TEMPORAL ARTERY BIOPSY;  Surgeon: WJudeth Horn MD;  [facility]  Service: General;  Laterality: N/A;  . CATARACT EXTRACTION W/PHACO Left 02/09/2017   Procedure: CATARACT EXTRACTION PHACO AND INTRAOCULAR LENS PLACEMENT LEFT EYE;  Surgeon: HTonny Branch MD;  Location: [facility];  Service: Ophthalmology;  Laterality: Left;  CDE: 4.89  . CATARACT EXTRACTION W/PHACO Right 06/04/2017   Procedure: CATARACT EXTRACTION PHACO AND INTRAOCULAR LENS PLACEMENT (IOC);  Surgeon: HTonny Branch MD;  Location: [facility];  Service: Ophthalmology;  Laterality: Right;  CDE: 4.12  . CHOLECYSTECTOMY  09/29/2011   Procedure: LAPAROSCOPIC CHOLECYSTECTOMY;  Surgeon: MJamesetta So MD;  Location: [facility];  Service: General;  Laterality: N/A;  . COLONOSCOPY  Jan 2011   Dr. HBenson Norway sessile polyp (benign lymphoid), large hemorrhoids, repeat 5-10 years  . COLONOSCOPY N/A 06/12/2016   Procedure: COLONOSCOPY;  Surgeon: RDaneil Dolin MD;  Location: [facility];  Service: Endoscopy;  Laterality: N/A;  1230   . ESOPHAGOGASTRODUODENOSCOPY  09/05/2011   ROEU:MPNTIhiatal hernia; remainder of exam normal. No explanation for patient's abdominal pain with today's examination  . ESOPHAGOGASTRODUODENOSCOPY N/A 12/17/2013   Dr. RGala Romney gastric erythema, erosion, mild chronic inflammation on path   . LAPAROSCOPIC APPENDECTOMY  09/29/2011   Procedure: APPENDECTOMY LAPAROSCOPIC;  Surgeon: MJamesetta So MD;  Location: [facility];  Service: General;;  incidental appendectomy  . LEFT HEART CATH AND CORONARY ANGIOGRAPHY N/A 02/28/2019   Procedure: LEFT HEART CATH AND CORONARY ANGIOGRAPHY;  Surgeon: VJettie Booze MD;   [facility];  Service: Cardiovascular;  Laterality: N/A;  . PARTIAL MASTECTOMY WITH NEEDLE LOCALIZATION AND AXILLARY SENTINEL LYMPH NODE BX Right 09/18/2018   Procedure: RIGHT PARTIAL MASTECTOMY AFTER NEEDLE LOCALIZATION, SENTINEL LYMPH NODE BIOPSY RIGHT AXILLA;  Surgeon: JAviva Signs MD;  Location: [facility];  Service: General;  Laterality: Right;     SOCIAL HISTORY:  Social History   Socioeconomic History  . Marital status: Married    Spouse name: Not on file  . Number of children: Not on file  . Years of education: Not on file  . Highest education level: Not on file  Occupational History  . 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 Smoking status: Never Smoker  . Smokeless tobacco: Never Used  Substance and Sexual Activity  . Alcohol use: No  . Drug use: No  . Sexual activity: Yes    Birth control/protection: Surgical  Other Topics Concern  . Not on file  Social History Narrative   [place] [place]    When trucks come, she has to put items in their places.   Also has to get items  from high shelves-causes achy pain in shoulder area      Married.   Children are grown, out of house.   Social Determinants of Health   Financial Resource Strain: Low Risk   . Difficulty of Paying Living Expenses: Not hard at all  Food Insecurity: No Food Insecurity  . Worried About Charity fundraiser in the Last Year: Never true  . Ran Out of Food in the Last Year: Never true  Transportation Needs: No Transportation Needs  . Lack of Transportation (Medical): No  . Lack of Transportation (Non-Medical): No  Physical Activity: Inactive  . Days of Exercise per Week: 0 days  . Minutes of Exercise per Session: 0 min  Stress: No Stress Concern Present  . Feeling of Stress : Not at all  Social Connections: Slightly Isolated  . Frequency of Communication with Friends and Family: Twice a week  . Frequency of Social Gatherings with Friends and  Family: Twice a week  . Attends Religious Services: More than 4 times per year  . Active Member of Clubs or Organizations: No  . Attends Archivist Meetings: Never  . Marital Status: Married  Human resources officer Violence: Not At Risk  . Fear of Current or Ex-Partner: No  . Emotionally Abused: No  . Physically Abused: No  . Sexually Abused: No    FAMILY HISTORY:  Family History  Problem Relation Age of Onset  . Hypertension Mother   . Coronary artery disease Mother   . Diabetes Mother   . Hypertension Sister   . Coronary artery disease Sister   . Hypertension Brother   . Heart attack Father   . Hypertension Son   . Heart attack Maternal Aunt   . Hypertension Maternal Aunt   . Diabetes Maternal Aunt   . Heart attack Maternal Uncle   . Hypertension Maternal Uncle   . Diabetes Maternal Uncle   . Heart attack Paternal Aunt   . Hypertension Paternal Aunt   . Diabetes Paternal Aunt   . Heart attack Paternal Uncle   . Hypertension Paternal Uncle   . Diabetes Paternal Uncle   . Heart attack Maternal Grandmother   . Heart attack Maternal Grandfather   . Heart attack Paternal Grandmother   . Heart attack Paternal Grandfather   . Colon cancer Neg Hx     CURRENT MEDICATIONS:  Outpatient Encounter Medications as of 06/02/2019  Medication Sig Note  . aspirin EC 81 MG tablet Take 81 mg by mouth daily.   Marland Kitchen atorvastatin (LIPITOR) 20 MG tablet TAKE 1 TABLET DAILY   . B Complex-C-Folic Acid (DIALYVITE 973) 0.8 MG TABS Take 800 mg by mouth daily.    . cinacalcet (SENSIPAR) 30 MG tablet Take 2 tablets (60 mg total) by mouth daily.   . DULoxetine (CYMBALTA) 30 MG capsule Take 30 mg by mouth daily.    Marland Kitchen lanthanum (FOSRENOL) 1000 MG chewable tablet Chew 1,000 mg by mouth 2 (two) times daily with a meal.    . lidocaine-prilocaine (EMLA) cream APPLY SMALL AMOUNT TO ACCESS SITE (AVF) 1 TO 2 HOURS BEFORE DIALYSIS. COVER WITH OCCLUSIVE DRESSING (SARAN [place]AP)   . losartan (COZAAR) 50 MG  tablet TAKE 1 TABLET DAILY (DISCONTINUE LOSARTAN 100 MG)   . tiZANidine (ZANAFLEX) 4 MG capsule TAKE 1 CAPSULE(4 MG) BY MOUTH TWICE DAILY WITH A MEAL. START 1 CAPSULE AT NIGHT FOR 2 WEEKS AND THEN TWICE DAILY (Patient taking differently: Take 4 mg by mouth 2 (two) times daily as needed. )   . [  DISCONTINUED] anastrozole (ARIMIDEX) 1 MG tablet Take 1 tablet (1 mg total) by mouth daily.   . [DISCONTINUED] gabapentin (NEURONTIN) 300 MG capsule TAKE 1 CAPSULE(300 MG) BY MOUTH AT BEDTIME   . [DISCONTINUED] nebivolol (BYSTOLIC) 10 MG tablet Take 1 tablet (10 mg total) by mouth 2 (two) times daily. (Patient taking differently: Take 10 mg by mouth daily. )   . [DISCONTINUED] pantoprazole (PROTONIX) 40 MG tablet TAKE 1 TABLET(40 MG) BY MOUTH DAILY   . nitroGLYCERIN (NITROSTAT) 0.4 MG SL tablet Place 1 tablet (0.4 mg total) under the tongue every 5 (five) minutes as needed for chest pain. 02/28/2019: Patient took 2 doses  . [DISCONTINUED] amLODipine (NORVASC) 10 MG tablet Take 20 mg by mouth at bedtime. 2 tabs (20 mg total)   . [DISCONTINUED] amLODipine (NORVASC) 5 MG tablet Take 2 Tablets (10 mg) on NON-Dialysis Days and take 1 Tablet (5 mg) on Dialysis Days   . [DISCONTINUED] B Complex-C-Zn-Folic Acid (DIALYVITE 562-BWLS 15) 0.8 MG TABS Take 1 tablet by mouth daily.   . [DISCONTINUED] benzonatate (TESSALON) 200 MG capsule Take 1 capsule (200 mg total) by mouth 3 (three) times daily as needed for cough. (Patient not taking: Reported on 06/02/2019)   . [DISCONTINUED] butalbital-acetaminophen-caffeine (FIORICET, ESGIC) 50-325-40 MG tablet Take 1 tablet by mouth every 6 (six) hours as needed for headache.   . [DISCONTINUED] Calcium Carb-Cholecalciferol (CALCIUM 1000 + D PO) Take 1 tablet by mouth 2 (two) times a day.    . [DISCONTINUED] Investigational vitamin D 600 UNITS capsule SWOG L3734 Take 600 Units by mouth daily. Take with food.    No facility-administered encounter medications on file as of 06/02/2019.     ALLERGIES:  Allergies  Allergen Reactions  . Hydrocodone     itching     PHYSICAL EXAM:  ECOG Performance status: 1  Vitals:   06/02/19 1259  BP: (!) 144/82  Pulse: 84  Resp: 18  Temp: (!) 97.1 F (36.2 C)  SpO2: 99%   Filed Weights   06/02/19 1259  Weight: 160 lb 11.2 oz (72.9 kg)    Physical Exam Constitutional:      Appearance: Normal appearance.  HENT:     Head: Normocephalic.     Right Ear: External ear normal.     Left Ear: External ear normal.  Eyes:     Conjunctiva/sclera: Conjunctivae normal.  Cardiovascular:     Rate and Rhythm: Normal rate and regular rhythm.     Pulses: Normal pulses.     Heart sounds: Normal heart sounds.  Pulmonary:     Effort: Pulmonary effort is normal.     Breath sounds: Normal breath sounds.  Abdominal:     General: Bowel sounds are normal.  Musculoskeletal:        General: Normal range of motion.     Cervical back: Normal range of motion.  Skin:    General: Skin is warm.  Neurological:     General: No focal deficit present.     Mental Status: She is alert and oriented to person, place, and time.  Psychiatric:        Mood and Affect: Mood normal.        Behavior: Behavior normal.      LABORATORY DATA:  I have reviewed the labs as listed.  CBC    Component Value Date/Time   WBC 6.7 06/02/2019 1226   RBC 3.99 06/02/2019 1226   HGB 12.6 06/02/2019 1226   HCT 39.7 06/02/2019 1226   PLT  207 06/02/2019 1226   MCV 99.5 06/02/2019 1226   MCH 31.6 06/02/2019 1226   MCHC 31.7 06/02/2019 1226   RDW 17.8 (H) 06/02/2019 1226   LYMPHSABS 1.6 06/02/2019 1226   MONOABS 0.8 06/02/2019 1226   EOSABS 0.1 06/02/2019 1226   BASOSABS 0.1 06/02/2019 1226   CMP Latest Ref Rng & Units 06/02/2019 03/01/2019 02/28/2019  Glucose 70 - 99 mg/dL 112(H) 101(H) 126(H)  BUN 6 - 20 mg/dL 44(H) 56(H) 47(H)  Creatinine 0.44 - 1.00 mg/dL 6.98(H) 6.94(H) 5.70(H)  Sodium 135 - 145 mmol/L 138 138 139  Potassium 3.5 - 5.1 mmol/L 3.9 4.3  4.1  Chloride 98 - 111 mmol/L 101 97(L) 96(L)  CO2 22 - 32 mmol/L 24 26 28   Calcium 8.9 - 10.3 mg/dL 9.1 8.5(L) 8.9  Total Protein 6.5 - 8.1 g/dL 7.1 - -  Total Bilirubin 0.3 - 1.2 mg/dL 1.0 - -  Alkaline Phos 38 - 126 U/L 129(H) - -  AST 15 - 41 U/L 24 - -  ALT 0 - 44 U/L 33 - -          ASSESSMENT & PLAN:   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) 1.  Stage Ib (T1CN0) right breast cancer, ER positive, PR and HER-2 negative: - Right breast biopsy on 08/27/2026 1:00 consistent with IDC. -Right lumpectomy on 09/18/1998 20-1.5 cm IDC, grade 3, associated high-grade DCIS, negative margins, 0/3 lymph nodes positive, ER 50%, PR negative and HER-2 negative, Ki-67 60%. -Oncotype DX recurrence score 47.  Distant recurrence at 9 years with tamoxifen alone is 36%.  Absolute chemotherapy benefit is more than 15%. - Adjuvant chemotherapy with 4 cycles of AC from 10/30/2018 through 01/01/2019. -Breast exam today did not reveal any palpable masses.  Right breast lumpectomy scar in the upper outer quadrant is well-healed.  No palpable adenopathy. - We talked about starting her on anastrozole for at least 5 years, potentially longer.  We talked about the side effects in detail. -Continues on anastrozole, is tolerating well.  She denies any changes in her breasts.  No new lumps, masses, nipple inversion or discharge.  Labs are stable. -She will return to clinic in 4 months with diagnostic mammogram or sooner if needed.  2.  Osteopenia: - Bone density on 01/20/2019 shows T score of -1.9. - She was told to take calcium and vitamin D twice daily. -I plan to repeat bone density test in 2 years.  3.  ESRD on HD: -She has been on hemodialysis for the past 1 and half year.  ESRD was thought to be secondary to hypertension.  4.  Neuropathy: -She has neuropathy in the past year, etiology unknown.  She has burning sensation in the bottom of the feet, predominantly in the toes.  No  neuropathic symptoms in the hands.      Orders placed this encounter:  Orders Placed This Encounter  Procedures  . MM Digital Diagnostic [place] [phone]

## 2019-07-29 NOTE — Assessment & Plan Note (Addendum)
1.  Stage Ib (T1CN0) right breast cancer, ER positive, PR and HER-2 negative: - Right breast biopsy on 08/27/2026 1:00 consistent with IDC. -Right lumpectomy on 09/18/1998 20-1.5 cm IDC, grade 3, associated high-grade DCIS, negative margins, 0/3 lymph nodes positive, ER 50%, PR negative and HER-2 negative, Ki-67 60%. -Oncotype DX recurrence score 47.  Distant recurrence at 9 years with tamoxifen alone is 36%.  Absolute chemotherapy benefit is more than 15%. - Adjuvant chemotherapy with 4 cycles of AC from 10/30/2018 through 01/01/2019. -Breast exam today did not reveal any palpable masses.  Right breast lumpectomy scar in the upper outer quadrant is well-healed.  No palpable adenopathy. - We talked about starting her on anastrozole for at least 5 years, potentially longer.  We talked about the side effects in detail. -Continues on anastrozole, is tolerating well.  She denies any changes in her breasts.  No new lumps, masses, nipple inversion or discharge.  Labs are stable. -She will return to clinic in 4 months with diagnostic mammogram or sooner if needed.  2.  Osteopenia: - Bone density on 01/20/2019 shows T score of -1.9. - She was told to take calcium and vitamin D twice daily. -I plan to repeat bone density test in 2 years.  3.  ESRD on HD: -She has been on hemodialysis for the past 1 and half year.  ESRD was thought to be secondary to hypertension.  4.  Neuropathy: -She has neuropathy in the past year, etiology unknown.  She has burning sensation in the bottom of the feet, predominantly in the toes.  No neuropathic symptoms in the hands. 

## 2019-07-31 ENCOUNTER — Ambulatory Visit (INDEPENDENT_AMBULATORY_CARE_PROVIDER_SITE_OTHER): Payer: Medicare Other | Admitting: Gastroenterology

## 2019-07-31 ENCOUNTER — Encounter: Payer: Self-pay | Admitting: Gastroenterology

## 2019-07-31 ENCOUNTER — Other Ambulatory Visit: Payer: Self-pay

## 2019-07-31 VITALS — BP 131/79 | HR 88 | Temp 96.5°F | Ht 62.0 in | Wt 153.8 lb

## 2019-07-31 DIAGNOSIS — K642 Third degree hemorrhoids: Secondary | ICD-10-CM

## 2019-07-31 NOTE — Patient Instructions (Signed)
We will see you in 2-3 weeks for additional banding! I anticipate you will need more than 3 sessions as we discussed.  Please call if you have significant bleeding. Otherwise, you will likely still see some spotting, wiping blood at times until we take care of all the columns.   Limit toilet time to 2-3 minutes.  Have a wonderful Christmas!  I enjoyed seeing you again today! As you know, I value our relationship and want to provide genuine, compassionate, and quality care. I welcome your feedback. If you receive a survey regarding your visit,  I greatly appreciate you taking time to fill this out. See you next time!  Annitta Needs, PhD, ANP-BC [facility]

## 2019-07-31 NOTE — Progress Notes (Signed)
[facility] Banding Note:   58 year old female presenting with history of internal hemorrhoids, s/p colonoscopy in Oct 2017 with prominent hemorrhoids as source of rectal bleeding. She was recently seen by Dr. Glo Herring who found prolapsing internal hemorrhoids on exam. She as referred to [place] for consideration of hemorrhoid banding.   The patient presents with symptomatic grade 3 hemorrhoids, unresponsive to maximal medical therapy, requesting rubber band ligation of her hemorrhoidal disease. She notes itching, paper hematochezia, and leakage/soilage. No pain. She undergoes dialysis Tues/Thurs/Saturday. She had dialysis today prior to coming. Although she received heparin during dialysis, she is not on daily anticoagulant therapy and has no contraindication to banding. All risks, benefits, and alternative forms of therapy were described and informed consent was obtained. We discussed in detail potential for bleeding when eschar sloughs if coinciding near a dialysis session; however, the risk of significant bleeding is low. She understands this and wants to proceed with banding today.   In the left lateral decubitus position examination revealed grade 3 hemorrhoids.  The decision was made to band the right posterior internal hemorrhoid, and the [place] was used to perform band ligation without complication. Digital anorectal examination was then performed to assure proper positioning of the band, and no adjustment was required. The patient was discharged home without pain or other issues. Dietary and behavioral recommendations were given, along with follow-up instructions. The patient will return in 2-3 weeks for followup and possible additional banding as required. I anticipate she may need more than 3 sessions due to extent of hemorrhoids. She does not clinically have Grade 4 hemorrhoids, and prolapsed hemorrhoid today was easily reducible without pain.   No complications were encountered and the patient  tolerated the procedure well.  Annitta Needs, PhD, ANP-BC [facility]

## 2019-08-05 ENCOUNTER — Encounter: Payer: Medicare Other | Admitting: Gastroenterology

## 2019-08-11 ENCOUNTER — Other Ambulatory Visit: Payer: Self-pay | Admitting: Family Medicine

## 2019-08-11 MED ORDER — NEBIVOLOL HCL 10 MG PO TABS: 10.0000 mg | ORAL_TABLET | Freq: Every day | ORAL | 0 refills | Status: DC

## 2019-08-11 NOTE — Telephone Encounter (Signed)
Patient needs refill on bystolic  [facility]

## 2019-08-11 NOTE — Telephone Encounter (Signed)
Refills sent into pharmacy on bystolic

## 2019-08-14 ENCOUNTER — Other Ambulatory Visit: Payer: Self-pay

## 2019-08-14 ENCOUNTER — Telehealth: Payer: Self-pay | Admitting: Family Medicine

## 2019-08-14 MED ORDER — NEBIVOLOL HCL 10 MG PO TABS: 10.0000 mg | ORAL_TABLET | Freq: Every day | ORAL | 0 refills | Status: DC

## 2019-08-14 NOTE — Telephone Encounter (Signed)
Patient calling about a prescription refill  Has some questions  [phone]

## 2019-08-14 NOTE — Telephone Encounter (Signed)
Medication Samples have been provided to the patient.  Drug name: bystolic       Strength: 10 mg        Qty: 3  LOT: [id number]  Exp.Date: 09/2020  Dosing instructions: Take 1 tablet (10 mg total) by mouth daily   The patient has been instructed regarding the correct time, dose, and frequency of taking this medication, including desired effects and most common side effects.   Vanice Sarah 3:18 PM 08/14/2019

## 2019-08-15 DIAGNOSIS — N040 Nephrotic syndrome with minor glomerular abnormality: Secondary | ICD-10-CM | POA: Diagnosis not present

## 2019-08-15 DIAGNOSIS — Z992 Dependence on renal dialysis: Secondary | ICD-10-CM | POA: Diagnosis not present

## 2019-08-15 DIAGNOSIS — N186 End stage renal disease: Secondary | ICD-10-CM | POA: Diagnosis not present

## 2019-08-17 DIAGNOSIS — N186 End stage renal disease: Secondary | ICD-10-CM | POA: Diagnosis not present

## 2019-08-17 DIAGNOSIS — N2581 Secondary hyperparathyroidism of renal origin: Secondary | ICD-10-CM | POA: Diagnosis not present

## 2019-08-17 DIAGNOSIS — Z23 Encounter for immunization: Secondary | ICD-10-CM | POA: Diagnosis not present

## 2019-08-17 DIAGNOSIS — N185 Chronic kidney disease, stage 5: Secondary | ICD-10-CM | POA: Diagnosis not present

## 2019-08-17 DIAGNOSIS — E119 Type 2 diabetes mellitus without complications: Secondary | ICD-10-CM | POA: Diagnosis not present

## 2019-08-17 DIAGNOSIS — E1129 Type 2 diabetes mellitus with other diabetic kidney complication: Secondary | ICD-10-CM | POA: Diagnosis not present

## 2019-08-17 DIAGNOSIS — Z992 Dependence on renal dialysis: Secondary | ICD-10-CM | POA: Diagnosis not present

## 2019-08-17 DIAGNOSIS — D631 Anemia in chronic kidney disease: Secondary | ICD-10-CM | POA: Diagnosis not present

## 2019-08-19 ENCOUNTER — Other Ambulatory Visit (HOSPITAL_COMMUNITY): Payer: Self-pay | Admitting: Hematology

## 2019-08-19 DIAGNOSIS — N186 End stage renal disease: Secondary | ICD-10-CM | POA: Diagnosis not present

## 2019-08-19 DIAGNOSIS — E119 Type 2 diabetes mellitus without complications: Secondary | ICD-10-CM | POA: Diagnosis not present

## 2019-08-19 DIAGNOSIS — Z992 Dependence on renal dialysis: Secondary | ICD-10-CM | POA: Diagnosis not present

## 2019-08-19 DIAGNOSIS — D631 Anemia in chronic kidney disease: Secondary | ICD-10-CM | POA: Diagnosis not present

## 2019-08-19 DIAGNOSIS — C50211 Malignant neoplasm of upper-inner quadrant of right female breast: Secondary | ICD-10-CM

## 2019-08-19 DIAGNOSIS — E1129 Type 2 diabetes mellitus with other diabetic kidney complication: Secondary | ICD-10-CM | POA: Diagnosis not present

## 2019-08-19 DIAGNOSIS — N2581 Secondary hyperparathyroidism of renal origin: Secondary | ICD-10-CM | POA: Diagnosis not present

## 2019-08-19 DIAGNOSIS — Z170 Estrogen receptor positive status [ER+]: Secondary | ICD-10-CM

## 2019-08-21 DIAGNOSIS — Z992 Dependence on renal dialysis: Secondary | ICD-10-CM | POA: Diagnosis not present

## 2019-08-21 DIAGNOSIS — E1129 Type 2 diabetes mellitus with other diabetic kidney complication: Secondary | ICD-10-CM | POA: Diagnosis not present

## 2019-08-21 DIAGNOSIS — D631 Anemia in chronic kidney disease: Secondary | ICD-10-CM | POA: Diagnosis not present

## 2019-08-21 DIAGNOSIS — E119 Type 2 diabetes mellitus without complications: Secondary | ICD-10-CM | POA: Diagnosis not present

## 2019-08-21 DIAGNOSIS — N186 End stage renal disease: Secondary | ICD-10-CM | POA: Diagnosis not present

## 2019-08-21 DIAGNOSIS — N2581 Secondary hyperparathyroidism of renal origin: Secondary | ICD-10-CM | POA: Diagnosis not present

## 2019-08-22 DIAGNOSIS — Z992 Dependence on renal dialysis: Secondary | ICD-10-CM | POA: Diagnosis not present

## 2019-08-22 DIAGNOSIS — N186 End stage renal disease: Secondary | ICD-10-CM | POA: Diagnosis not present

## 2019-08-22 DIAGNOSIS — D631 Anemia in chronic kidney disease: Secondary | ICD-10-CM | POA: Diagnosis not present

## 2019-08-22 DIAGNOSIS — E1129 Type 2 diabetes mellitus with other diabetic kidney complication: Secondary | ICD-10-CM | POA: Diagnosis not present

## 2019-08-22 DIAGNOSIS — N2581 Secondary hyperparathyroidism of renal origin: Secondary | ICD-10-CM | POA: Diagnosis not present

## 2019-08-22 DIAGNOSIS — E119 Type 2 diabetes mellitus without complications: Secondary | ICD-10-CM | POA: Diagnosis not present

## 2019-08-26 ENCOUNTER — Telehealth: Payer: Self-pay

## 2019-08-26 DIAGNOSIS — Z992 Dependence on renal dialysis: Secondary | ICD-10-CM | POA: Diagnosis not present

## 2019-08-26 DIAGNOSIS — D631 Anemia in chronic kidney disease: Secondary | ICD-10-CM | POA: Diagnosis not present

## 2019-08-26 DIAGNOSIS — N2581 Secondary hyperparathyroidism of renal origin: Secondary | ICD-10-CM | POA: Diagnosis not present

## 2019-08-26 DIAGNOSIS — E119 Type 2 diabetes mellitus without complications: Secondary | ICD-10-CM | POA: Diagnosis not present

## 2019-08-26 DIAGNOSIS — E1129 Type 2 diabetes mellitus with other diabetic kidney complication: Secondary | ICD-10-CM | POA: Diagnosis not present

## 2019-08-26 DIAGNOSIS — N186 End stage renal disease: Secondary | ICD-10-CM | POA: Diagnosis not present

## 2019-08-26 NOTE — Telephone Encounter (Signed)
Medication Samples have been provided to the patient.  Drug name: Bystolic       Strength: 10 mg        Qty: 2  LOT: [id number]  Exp.Date: 10/11/2020  Dosing instructions:Take 1 tablet (10 mg total) by mouth daily.   The patient has been instructed regarding the correct time, dose, and frequency of taking this medication, including desired effects and most common side effects.   Vanice Sarah 10:39 AM 08/26/2019

## 2019-08-27 ENCOUNTER — Encounter: Payer: Self-pay | Admitting: Gastroenterology

## 2019-08-27 ENCOUNTER — Other Ambulatory Visit: Payer: Self-pay

## 2019-08-27 ENCOUNTER — Ambulatory Visit (INDEPENDENT_AMBULATORY_CARE_PROVIDER_SITE_OTHER): Payer: Medicare Other | Admitting: Gastroenterology

## 2019-08-27 VITALS — BP 135/78 | HR 75 | Temp 96.9°F | Ht 62.0 in | Wt 161.6 lb

## 2019-08-27 DIAGNOSIS — K641 Second degree hemorrhoids: Secondary | ICD-10-CM

## 2019-08-27 NOTE — Progress Notes (Signed)
[facility] Banding Note:     59 year old female presenting with history of internal hemorrhoids, s/p colonoscopy in Oct 2017 with prominent hemorrhoids as source of rectal bleeding. She was recently seen by Dr. Glo Herring who found prolapsing internal hemorrhoids on exam. Presented for first banding in Dec 2020 with symptomatic Grade 3 hemorrhoids. She undergoes dialysis Tues/Thurs/Saturday. Previously banded the right posterior.   The patient presents with symptomatic grade 2 hemorrhoids, unresponsive to maximal medical therapy, requesting rubber band ligation of her hemorrhoidal disease. All risks, benefits, and alternative forms of therapy were described and informed consent was obtained. She does note resolution of leaking/soiling since first banding. Minor itching. No bleeding.  The decision was made to band the left lateral internal hemorrhoid, and the [place] was used to perform band ligation without complication. Digital anorectal examination was then performed to assure proper positioning of the band, and no adjustment was needed. The patient was discharged home without pain or other issues. Dietary and behavioral recommendations were given, along with follow-up instructions. The patient will return in 2-3 weeks  for followup and possible additional banding as required. Will likely band the right anterior at next appointment.  No complications were encountered and the patient tolerated the procedure well

## 2019-08-27 NOTE — Patient Instructions (Signed)
Pressure is normal, but if you experience any pain, please call.  Continue to avoid straining, limit toilet time to 2-3 minutes.  We will see you back in 2-3 weeks for a third banding, which may be the last one, as you are doing so well!  I enjoyed seeing you again today! As you know, I value our relationship and want to provide genuine, compassionate, and quality care. I welcome your feedback. If you receive a survey regarding your visit,  I greatly appreciate you taking time to fill this out. See you next time!  Annitta Needs, PhD, ANP-BC [facility]

## 2019-08-28 DIAGNOSIS — N186 End stage renal disease: Secondary | ICD-10-CM | POA: Diagnosis not present

## 2019-08-28 DIAGNOSIS — E119 Type 2 diabetes mellitus without complications: Secondary | ICD-10-CM | POA: Diagnosis not present

## 2019-08-28 DIAGNOSIS — N2581 Secondary hyperparathyroidism of renal origin: Secondary | ICD-10-CM | POA: Diagnosis not present

## 2019-08-28 DIAGNOSIS — Z992 Dependence on renal dialysis: Secondary | ICD-10-CM | POA: Diagnosis not present

## 2019-08-28 DIAGNOSIS — D631 Anemia in chronic kidney disease: Secondary | ICD-10-CM | POA: Diagnosis not present

## 2019-08-28 DIAGNOSIS — E1129 Type 2 diabetes mellitus with other diabetic kidney complication: Secondary | ICD-10-CM | POA: Diagnosis not present

## 2019-08-30 DIAGNOSIS — Z992 Dependence on renal dialysis: Secondary | ICD-10-CM | POA: Diagnosis not present

## 2019-08-30 DIAGNOSIS — D631 Anemia in chronic kidney disease: Secondary | ICD-10-CM | POA: Diagnosis not present

## 2019-08-30 DIAGNOSIS — E1129 Type 2 diabetes mellitus with other diabetic kidney complication: Secondary | ICD-10-CM | POA: Diagnosis not present

## 2019-08-30 DIAGNOSIS — N186 End stage renal disease: Secondary | ICD-10-CM | POA: Diagnosis not present

## 2019-08-30 DIAGNOSIS — N2581 Secondary hyperparathyroidism of renal origin: Secondary | ICD-10-CM | POA: Diagnosis not present

## 2019-08-30 DIAGNOSIS — E119 Type 2 diabetes mellitus without complications: Secondary | ICD-10-CM | POA: Diagnosis not present

## 2019-09-01 DIAGNOSIS — Z992 Dependence on renal dialysis: Secondary | ICD-10-CM | POA: Diagnosis not present

## 2019-09-01 DIAGNOSIS — N2581 Secondary hyperparathyroidism of renal origin: Secondary | ICD-10-CM | POA: Diagnosis not present

## 2019-09-01 DIAGNOSIS — E1129 Type 2 diabetes mellitus with other diabetic kidney complication: Secondary | ICD-10-CM | POA: Diagnosis not present

## 2019-09-01 DIAGNOSIS — E119 Type 2 diabetes mellitus without complications: Secondary | ICD-10-CM | POA: Diagnosis not present

## 2019-09-01 DIAGNOSIS — N186 End stage renal disease: Secondary | ICD-10-CM | POA: Diagnosis not present

## 2019-09-01 DIAGNOSIS — D631 Anemia in chronic kidney disease: Secondary | ICD-10-CM | POA: Diagnosis not present

## 2019-09-02 ENCOUNTER — Ambulatory Visit (HOSPITAL_COMMUNITY)
Admission: RE | Admit: 2019-09-02 | Discharge: 2019-09-02 | Disposition: A | Discharge status: Home / Self Care | Payer: Medicare Other | Source: Ambulatory Visit | Attending: Hematology | Admitting: Hematology

## 2019-09-02 ENCOUNTER — Inpatient Hospital Stay (HOSPITAL_COMMUNITY): Payer: Medicare Other | Attending: Hematology

## 2019-09-02 ENCOUNTER — Other Ambulatory Visit: Payer: Self-pay

## 2019-09-02 ENCOUNTER — Other Ambulatory Visit (HOSPITAL_COMMUNITY): Payer: Medicare Other

## 2019-09-02 ENCOUNTER — Ambulatory Visit (HOSPITAL_COMMUNITY): Admission: RE | Admit: 2019-09-02 | Payer: Medicare Other | Source: Ambulatory Visit

## 2019-09-02 ENCOUNTER — Ambulatory Visit (HOSPITAL_COMMUNITY): Payer: Medicare Other

## 2019-09-02 DIAGNOSIS — C50211 Malignant neoplasm of upper-inner quadrant of right female breast: Secondary | ICD-10-CM | POA: Insufficient documentation

## 2019-09-02 DIAGNOSIS — Z170 Estrogen receptor positive status [ER+]: Secondary | ICD-10-CM | POA: Diagnosis not present

## 2019-09-02 DIAGNOSIS — R921 Mammographic calcification found on diagnostic imaging of breast: Secondary | ICD-10-CM | POA: Diagnosis not present

## 2019-09-02 LAB — COMPREHENSIVE METABOLIC PANEL
ALT: 37 U/L (ref 0–44)
AST: 29 U/L (ref 15–41)
Albumin: 4.3 g/dL (ref 3.5–5.0)
Alkaline Phosphatase: 127 U/L — ABNORMAL HIGH (ref 38–126)
Anion gap: 15 (ref 5–15)
BUN: 55 mg/dL — ABNORMAL HIGH (ref 6–20)
CO2: 29 mmol/L (ref 22–32)
Calcium: 10.2 mg/dL (ref 8.9–10.3)
Chloride: 94 mmol/L — ABNORMAL LOW (ref 98–111)
Creatinine, Ser: 7.74 mg/dL — ABNORMAL HIGH (ref 0.44–1.00)
GFR calc Af Amer: 6 mL/min — ABNORMAL LOW (ref >60)
GFR calc non Af Amer: 5 mL/min — ABNORMAL LOW (ref >60)
Glucose, Bld: 113 mg/dL — ABNORMAL HIGH (ref 70–99)
Potassium: 4.1 mmol/L (ref 3.5–5.1)
Sodium: 138 mmol/L (ref 135–145)
Total Bilirubin: 0.7 mg/dL (ref 0.3–1.2)
Total Protein: 8.7 g/dL — ABNORMAL HIGH (ref 6.5–8.1)

## 2019-09-02 LAB — CBC WITH DIFFERENTIAL/PLATELET
Abs Immature Granulocytes: 0.02 10*3/uL (ref 0.00–0.07)
Basophils Absolute: 0.1 10*3/uL (ref 0.0–0.1)
Basophils Relative: 1 %
Eosinophils Absolute: 0.1 10*3/uL (ref 0.0–0.5)
Eosinophils Relative: 2 %
HCT: 41.3 % (ref 36.0–46.0)
Hemoglobin: 12.9 g/dL (ref 12.0–15.0)
Immature Granulocytes: 0 %
Lymphocytes Relative: 24 %
Lymphs Abs: 2 10*3/uL (ref 0.7–4.0)
MCH: 31.9 pg (ref 26.0–34.0)
MCHC: 31.2 g/dL (ref 30.0–36.0)
MCV: 102.2 fL — ABNORMAL HIGH (ref 80.0–100.0)
Monocytes Absolute: 1.1 10*3/uL — ABNORMAL HIGH (ref 0.1–1.0)
Monocytes Relative: 13 %
Neutro Abs: 5 10*3/uL (ref 1.7–7.7)
Neutrophils Relative %: 60 %
Platelets: 284 10*3/uL (ref 150–400)
RBC: 4.04 MIL/uL (ref 3.87–5.11)
RDW: 16.1 % — ABNORMAL HIGH (ref 11.5–15.5)
WBC: 8.3 10*3/uL (ref 4.0–10.5)
nRBC: 0 % (ref 0.0–0.2)

## 2019-09-03 DIAGNOSIS — E119 Type 2 diabetes mellitus without complications: Secondary | ICD-10-CM | POA: Diagnosis not present

## 2019-09-03 DIAGNOSIS — D631 Anemia in chronic kidney disease: Secondary | ICD-10-CM | POA: Diagnosis not present

## 2019-09-03 DIAGNOSIS — Z992 Dependence on renal dialysis: Secondary | ICD-10-CM | POA: Diagnosis not present

## 2019-09-03 DIAGNOSIS — E1129 Type 2 diabetes mellitus with other diabetic kidney complication: Secondary | ICD-10-CM | POA: Diagnosis not present

## 2019-09-03 DIAGNOSIS — E039 Hypothyroidism, unspecified: Secondary | ICD-10-CM | POA: Diagnosis not present

## 2019-09-03 DIAGNOSIS — N186 End stage renal disease: Secondary | ICD-10-CM | POA: Diagnosis not present

## 2019-09-03 DIAGNOSIS — N2581 Secondary hyperparathyroidism of renal origin: Secondary | ICD-10-CM | POA: Diagnosis not present

## 2019-09-04 ENCOUNTER — Encounter (HOSPITAL_COMMUNITY): Payer: Self-pay | Admitting: *Deleted

## 2019-09-04 ENCOUNTER — Other Ambulatory Visit: Payer: Self-pay | Admitting: Hematology

## 2019-09-04 DIAGNOSIS — N186 End stage renal disease: Secondary | ICD-10-CM | POA: Diagnosis not present

## 2019-09-04 DIAGNOSIS — Z992 Dependence on renal dialysis: Secondary | ICD-10-CM | POA: Diagnosis not present

## 2019-09-04 DIAGNOSIS — E1129 Type 2 diabetes mellitus with other diabetic kidney complication: Secondary | ICD-10-CM | POA: Diagnosis not present

## 2019-09-04 DIAGNOSIS — E119 Type 2 diabetes mellitus without complications: Secondary | ICD-10-CM | POA: Diagnosis not present

## 2019-09-04 DIAGNOSIS — D631 Anemia in chronic kidney disease: Secondary | ICD-10-CM | POA: Diagnosis not present

## 2019-09-04 DIAGNOSIS — R921 Mammographic calcification found on diagnostic imaging of breast: Secondary | ICD-10-CM

## 2019-09-04 DIAGNOSIS — N2581 Secondary hyperparathyroidism of renal origin: Secondary | ICD-10-CM | POA: Diagnosis not present

## 2019-09-04 NOTE — Progress Notes (Signed)
Mammography requested that patient have stereotactic breast biopsy.  I have patient scheduled for 1/29 @ 1130 and she is to arrive at 11:10.  Patient is aware and she was given the phone number to the [place] [phone] to call if she has any questions regarding location or appointment.  She verbalizes understanding.

## 2019-09-08 ENCOUNTER — Encounter (HOSPITAL_COMMUNITY): Payer: Self-pay | Admitting: *Deleted

## 2019-09-08 ENCOUNTER — Other Ambulatory Visit: Payer: Self-pay

## 2019-09-08 ENCOUNTER — Emergency Department (HOSPITAL_COMMUNITY): Payer: Medicare Other

## 2019-09-08 ENCOUNTER — Emergency Department (HOSPITAL_COMMUNITY)
Admission: EM | Admit: 2019-09-08 | Discharge: 2019-09-08 | Disposition: A | Discharge status: Home / Self Care | Payer: Medicare Other | Attending: Emergency Medicine | Admitting: Emergency Medicine

## 2019-09-08 DIAGNOSIS — N186 End stage renal disease: Secondary | ICD-10-CM | POA: Insufficient documentation

## 2019-09-08 DIAGNOSIS — M79601 Pain in right arm: Secondary | ICD-10-CM | POA: Insufficient documentation

## 2019-09-08 DIAGNOSIS — I120 Hypertensive chronic kidney disease with stage 5 chronic kidney disease or end stage renal disease: Secondary | ICD-10-CM | POA: Diagnosis not present

## 2019-09-08 DIAGNOSIS — E1129 Type 2 diabetes mellitus with other diabetic kidney complication: Secondary | ICD-10-CM | POA: Diagnosis not present

## 2019-09-08 DIAGNOSIS — D631 Anemia in chronic kidney disease: Secondary | ICD-10-CM | POA: Diagnosis not present

## 2019-09-08 DIAGNOSIS — Z79899 Other long term (current) drug therapy: Secondary | ICD-10-CM | POA: Diagnosis not present

## 2019-09-08 DIAGNOSIS — Z7982 Long term (current) use of aspirin: Secondary | ICD-10-CM | POA: Diagnosis not present

## 2019-09-08 DIAGNOSIS — Z992 Dependence on renal dialysis: Secondary | ICD-10-CM | POA: Insufficient documentation

## 2019-09-08 DIAGNOSIS — N2581 Secondary hyperparathyroidism of renal origin: Secondary | ICD-10-CM | POA: Diagnosis not present

## 2019-09-08 DIAGNOSIS — E119 Type 2 diabetes mellitus without complications: Secondary | ICD-10-CM | POA: Diagnosis not present

## 2019-09-08 DIAGNOSIS — E1122 Type 2 diabetes mellitus with diabetic chronic kidney disease: Secondary | ICD-10-CM | POA: Insufficient documentation

## 2019-09-08 DIAGNOSIS — I10 Essential (primary) hypertension: Secondary | ICD-10-CM | POA: Diagnosis not present

## 2019-09-08 DIAGNOSIS — M25511 Pain in right shoulder: Secondary | ICD-10-CM | POA: Diagnosis not present

## 2019-09-08 LAB — I-STAT CHEM 8, ED
BUN: 35 mg/dL — ABNORMAL HIGH (ref 6–20)
Calcium, Ion: 1.08 mmol/L — ABNORMAL LOW (ref 1.15–1.40)
Chloride: 94 mmol/L — ABNORMAL LOW (ref 98–111)
Creatinine, Ser: 7.3 mg/dL — ABNORMAL HIGH (ref 0.44–1.00)
Glucose, Bld: 112 mg/dL — ABNORMAL HIGH (ref 70–99)
HCT: 38 % (ref 36.0–46.0)
Hemoglobin: 12.9 g/dL (ref 12.0–15.0)
Potassium: 3.6 mmol/L (ref 3.5–5.1)
Sodium: 137 mmol/L (ref 135–145)
TCO2: 31 mmol/L (ref 22–32)

## 2019-09-08 LAB — CBG MONITORING, ED: Glucose-Capillary: 103 mg/dL — ABNORMAL HIGH (ref 70–99)

## 2019-09-08 MED ORDER — PREDNISONE 10 MG PO TABS: 20.0000 mg | ORAL_TABLET | Freq: Every day | ORAL | 0 refills | Status: AC

## 2019-09-08 NOTE — ED Triage Notes (Signed)
Pain in right shoulder radiating into right arm

## 2019-09-08 NOTE — Discharge Instructions (Signed)
You have been seen today for right arm pain. Please read and follow all provided instructions. Return to the emergency room for worsening condition or new concerning symptoms. Watch for signs of infection including surrounding worsening pain, redness, warmth, or if you get fever and chills.  1. Medications:  Prescription sent to your pharmacy for prednisone. Please take this as prescribed. You should check your blood sugar daily while taking this medicine as it can elevate your blood sugar. Please stop taking if your blood sugar becomes elevated.   Continue usual home medications Take medications as prescribed. Please review all of the medicines and only take them if you do not have an allergy to them.   2. Treatment: rest, drink plenty of fluids  3. Follow Up:  Please follow up with primary care provider by scheduling an appointment as soon as possible for a visit  -Recommend you have primary care doctor reevaluate your arm pain in 2-5 days.   It is also a possibility that you have an allergic reaction to any of the medicines that you have been prescribed - Everybody reacts differently to medications and while MOST people have no trouble with most medicines, you may have a reaction such as nausea, vomiting, rash, swelling, shortness of breath. If this is the case, please stop taking the medicine immediately and contact your physician.  ?

## 2019-09-08 NOTE — ED Notes (Signed)
Patient placed on continuous cardiac, BP and SpO2 monitoring at this time. EKG obtained. Will continue to monitor.

## 2019-09-08 NOTE — ED Provider Notes (Signed)
[facility] EMERGENCY DEPARTMENT Provider Note   CSN: [id number] Arrival date & time: 09/08/19  1334     History Chief Complaint  Patient presents with  . Shoulder Pain    Megan [name] is a 59 y.o. female past medical history significant for hypertension, hyperlipidemia, ESRD, breast cancer presented to emergency department today with chief complaint of right arm pain x2 weeks.  She states the pain has been intermittent.  She denies any fall, injury or trauma.  She states the pain is sharp and radiates from her neck down to her forearm.  She denies worsening pain with activity. She rates the pain 8/10 in severity. She has tried using a heating pad with minimal symptom relief. She had dialysis today, completed her full session without any complications. She denies any fever, chills, chest pain, shortness of breath, abdominal pain, numbness, tingling, weakness.  History provided by patient with additional history obtained from chart review.     Past Medical History:  Diagnosis Date  . (HFpEF) heart failure with preserved ejection fraction ([place])    a. 01/2019 Echo: EF 55-60%, mild conc LVH. DD.  Torn MV chordae.  . Anemia   . Atypical chest pain    a. 08/2018 MV: EF 59%, no ischemia; b. 02/2019 Cath: nonobs dzs.  . Blood transfusion without reported diagnosis   . Cataract   . ESRD (end stage renal disease) on dialysis ([place])    a. HD T, T, S  . Essential hypertension, benign   . GERD (gastroesophageal reflux disease)   . Headache   . Hemorrhoids   . Mixed hyperlipidemia   . Morbid obesity ([place])   . Non-obstructive CAD (coronary artery disease)    a. 02/2019 CathL LM nl, LAD 74m, LCX nl, RCA 25p, EF 55-65%.  Marland Kitchen PONV (postoperative nausea and vomiting)   . S/P colonoscopy Jan 2011   Dr. Benson Norway: sessile polyp (benign lymphoid), large hemorrhoids, repeat 5-10 years  . Temporal arteritis ([place])   . Type 2 diabetes mellitus ([place])   . Wears glasses     Patient Active Problem List   Diagnosis Date Noted  . Prolapsed internal hemorrhoids, grade 3 07/31/2019  . (HFpEF) heart failure with preserved ejection fraction ([place]) 03/01/2019  . Chest pain 02/28/2019  . Chest pain with high risk for cardiac etiology 02/28/2019  . Elevated troponin 02/03/2019  . ESRD (end stage renal disease) ([place]) 02/03/2019  . Acute diastolic CHF (congestive heart failure) ([place]) 02/03/2019  . Hypokalemia 02/03/2019  . Acute respiratory failure with hypoxia ([place]) 02/03/2019  . 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[place])   . Rectal bleeding 06/08/2016  . Vaginal discharge 12/30/2015  . Abdominal pain, epigastric 11/26/2013  . GERD (gastroesophageal reflux disease)   . Type 2 diabetes mellitus ([place])   . CKD (chronic kidney disease) stage 3, GFR 30-59 ml/min   . External hemorrhoid 08/01/2011  . Hematochezia 07/13/2011  . Dyspnea 04/21/2010  . Atypical chest pain 04/21/2010  . Mixed hyperlipidemia 03/11/2008  . OBESITY 03/11/2008  . Essential hypertension, benign 03/11/2008    Past Surgical History:  Procedure Laterality Date  . ABDOMINAL HYSTERECTOMY    . APPENDECTOMY    . ARTERY BIOPSY N/A 05/09/2018   Procedure: RIGHT TEMPORAL ARTERY BIOPSY;  Surgeon: Judeth Horn, MD;  Location: [place];  Service: General;  Laterality: N/A;  . CATARACT EXTRACTION W/PHACO Left 02/09/2017   Procedure: CATARACT EXTRACTION PHACO AND INTRAOCULAR LENS PLACEMENT LEFT EYE;  Surgeon: Tonny Branch, MD;  Location: [facility];  Service: Ophthalmology;  Laterality: Left;  CDE: 4.89  . CATARACT EXTRACTION W/PHACO Right 06/04/2017   Procedure: CATARACT EXTRACTION PHACO AND INTRAOCULAR LENS PLACEMENT (IOC);  Surgeon: Tonny Branch, MD;  Location: [facility];  Service: Ophthalmology;  Laterality: Right;  CDE: 4.12  . CHOLECYSTECTOMY  09/29/2011   Procedure: LAPAROSCOPIC CHOLECYSTECTOMY;  Surgeon: Jamesetta So, MD;  Location: [facility];  Service: General;  Laterality: N/A;  . COLONOSCOPY  Jan 2011   Dr.  Benson Norway: sessile polyp (benign lymphoid), large hemorrhoids, repeat 5-10 years  . COLONOSCOPY N/A 06/12/2016   Procedure: COLONOSCOPY;  Surgeon: Daneil Dolin, MD;  [facility];  Service: Endoscopy;  Laterality: N/A;  1230   . ESOPHAGOGASTRODUODENOSCOPY  09/05/2011   ZOX:WRUEA hiatal hernia; remainder of exam normal. No explanation for patient's abdominal pain with today's examination  . ESOPHAGOGASTRODUODENOSCOPY N/A 12/17/2013   Dr. [name]: gastric erythema, erosion, mild chronic inflammation on path   . LAPAROSCOPIC APPENDECTOMY  09/29/2011   Procedure: APPENDECTOMY LAPAROSCOPIC;  Surgeon: Jamesetta So, MD;  Location: [facility];  Service: General;;  incidental appendectomy  . LEFT HEART CATH AND CORONARY ANGIOGRAPHY N/A 02/28/2019   Procedure: LEFT HEART CATH AND CORONARY ANGIOGRAPHY;  Surgeon: Jettie Booze, MD;  Location: [place] [facility];  Service: Cardiovascular;  Laterality: N/A;  . PARTIAL MASTECTOMY WITH NEEDLE LOCALIZATION AND AXILLARY SENTINEL LYMPH NODE BX Right 09/18/2018   Procedure: RIGHT PARTIAL MASTECTOMY AFTER NEEDLE LOCALIZATION, SENTINEL LYMPH NODE BIOPSY RIGHT AXILLA;  Surgeon: Aviva Signs, MD;  Location: [facility];  Service: General;  Laterality: Right;     OB History    Gravida      Para      Term      Preterm      AB      Living  1     SAB      TAB      Ectopic      Multiple      Live Births              Family History  Problem Relation Age of Onset  . Hypertension Mother   . Coronary artery disease Mother   . Diabetes Mother   . Hypertension Sister   . Coronary artery disease Sister   . Hypertension Brother   . Heart attack Father   . Hypertension Son   . Heart attack Maternal Aunt   . Hypertension Maternal Aunt   . Diabetes Maternal Aunt   . Heart attack Maternal Uncle   . Hypertension Maternal Uncle   . Diabetes Maternal Uncle   . Heart attack Paternal Aunt   . Hypertension Paternal Aunt   . Diabetes Paternal Aunt    . Heart attack Paternal Uncle   . Hypertension Paternal Uncle   . Diabetes Paternal Uncle   . Heart attack Maternal Grandmother   . Heart attack Maternal Grandfather   . Heart attack Paternal Grandmother   . Heart attack Paternal Grandfather   . Colon cancer Neg Hx     Social History   Tobacco Use  . Smoking status: Never Smoker  . Smokeless tobacco: Never Used  Substance Use Topics  . Alcohol use: No  . Drug use: No    Home Medications Prior to Admission medications   Medication Sig Start Date End Date Taking? Authorizing Provider  amLODipine (NORVASC) 10 MG tablet Take 2 tablets (20 mg total) by mouth at bedtime. 07/07/19   [name],  MD  anastrozole (ARIMIDEX) 1 MG tablet TAKE 1 TABLET(1 MG) BY MOUTH DAILY 06/17/19   Lockamy, Randi L, NP-C  aspirin EC 81 MG tablet Take 81 mg by mouth daily.    [provider]  atorvastatin (LIPITOR) 20 MG tablet TAKE 1 TABLET DAILY 05/13/19   [name], MD  B Complex-C-Folic Acid (DIALYVITE 767) 0.8 MG TABS Take 800 mg by mouth daily.  01/22/18   [provider]  cinacalcet (SENSIPAR) 30 MG tablet Take 2 tablets (60 mg total) by mouth daily. Patient taking differently: Take 30 mg by mouth every other day.  06/13/18   [name], MD  DULoxetine (CYMBALTA) 30 MG capsule Take 30 mg by mouth daily.  05/06/19   [provider]  gabapentin (NEURONTIN) 300 MG capsule TAKE 1 CAPSULE(300 MG) BY MOUTH AT BEDTIME 06/16/19   [name], MD  lanthanum (FOSRENOL) 1000 MG chewable tablet Chew 1,000 mg by mouth 2 (two) times daily with a meal.  05/27/19   [provider]  lidocaine-prilocaine (EMLA) cream APPLY SMALL AMOUNT TO ACCESS SITE (AVF) 1 TO 2 HOURS BEFORE DIALYSIS. COVER WITH OCCLUSIVE DRESSING (S[place]AN [place]AP) 09/24/18   [provider]  losartan (COZAAR) 50 MG tablet TAKE 1 TABLET DAILY (DISCONTINUE LOSARTAN 100 MG) 03/10/19   [name], MD  nebivolol (BYSTOLIC) 10 MG tablet  Take 1 tablet (10 mg total) by mouth daily. Patient taking differently: Take 10 mg by mouth 2 (two) times daily.  08/14/19   [name], MD  nitroGLYCERIN (NITROSTAT) 0.4 MG SL tablet Place 1 tablet (0.4 mg total) under the tongue every 5 (five) minutes as needed for chest pain. 02/27/19 05/28/19  Strader, Fransisco Hertz, PA-C  pantoprazole (PROTONIX) 40 MG tablet TAKE 1 TABLET(40 MG) BY MOUTH DAILY 06/02/19   Roger Shelter, FNP  predniSONE (DELTASONE) 10 MG tablet Take 2 tablets (20 mg total) by mouth daily for 5 days. 09/08/19 09/13/19  Yvon Mccord E, PA-C  tiZANidine (ZANAFLEX) 4 MG capsule TAKE 1 CAPSULE(4 MG) BY MOUTH TWICE DAILY WITH A MEAL. START 1 CAPSULE AT NIGHT FOR 2 WEEKS AND THEN TWICE DAILY Patient taking differently: Take 4 mg by mouth 2 (two) times daily as needed.  01/14/19   Garvin Fila, MD    Allergies    Hydrocodone  Review of Systems   Review of Systems All other systems are reviewed and are negative for acute change except as noted in the HPI.  Physical Exam Updated Vital Signs BP 131/77 (BP Location: Right Arm)   Pulse 86   Temp 98.3 F (36.8 C) (Oral)   Resp 18   SpO2 100%   Physical Exam Vitals and nursing note reviewed.  Constitutional:      General: She is not in acute distress.    Appearance: She is not ill-appearing.  HENT:     Head: Normocephalic and atraumatic.     Right Ear: Tympanic membrane and external ear normal.     Left Ear: Tympanic membrane and external ear normal.     Nose: Nose normal.     Mouth/Throat:     Mouth: Mucous membranes are moist.     Pharynx: Oropharynx is clear.  Eyes:     General: No scleral icterus.       Right eye: No discharge.        Left eye: No discharge.     Extraocular Movements: Extraocular movements intact.     Conjunctiva/sclera: Conjunctivae normal.  Pupils: Pupils are equal, round, and reactive to light.  Neck:     Vascular: No JVD.     Comments: Full ROM intact without spinous process TTP.  No bony stepoffs or deformities, no paraspinous muscle TTP or muscle spasms. No rigidity or meningeal signs. No bruising, erythema, or swelling.  Cardiovascular:     Rate and Rhythm: Normal rate and regular rhythm.     Pulses: Normal pulses.          Radial pulses are 2+ on the right side and 2+ on the left side.     Heart sounds: Normal heart sounds.  Pulmonary:     Comments: Lungs clear to auscultation in all fields. Symmetric chest rise. No wheezing, rales, or rhonchi. Abdominal:     Comments: Abdomen is soft, non-distended, and non-tender in all quadrants. No rigidity, no guarding. No peritoneal signs.  Musculoskeletal:        General: Normal range of motion.     Right shoulder: No swelling, deformity, tenderness, bony tenderness or crepitus. Normal range of motion. Normal strength.     Right upper arm: No swelling, edema, deformity, tenderness or bony tenderness.     Right elbow: No swelling, deformity or effusion. Normal range of motion. No tenderness.     Right forearm: Normal.     Right wrist: Normal.     Cervical back: Normal range of motion.     Comments: Right extremity neurovascularly intact distally. Compartments soft above and below affected joint.  Brisk cap refill. Strong and equal grip strength in bilateral upper extremities.    Skin:    General: Skin is warm and dry.     Capillary Refill: Capillary refill takes less than 2 seconds.  Neurological:     Mental Status: She is oriented to person, place, and time.     GCS: GCS eye subscore is 4. GCS verbal subscore is 5. GCS motor subscore is 6.     Comments: Fluent speech, no facial droop.  Psychiatric:        Behavior: Behavior normal.       ED Results / Procedures / Treatments   Labs (all labs ordered are listed, but only abnormal results are displayed) Labs Reviewed  CBG MONITORING, ED - Abnormal; Notable for the following components:      Result Value   Glucose-Capillary 103 (*)    All other components  within normal limits  I-STAT CHEM 8, ED - Abnormal; Notable for the following components:   Chloride 94 (*)    BUN 35 (*)    Creatinine, Ser 7.30 (*)    Glucose, Bld 112 (*)    Calcium, Ion 1.08 (*)    All other components within normal limits    EKG   EKG Interpretation  Date/Time:  Monday September 08 2019 14:33:36 EST Ventricular Rate:  79 PR Interval:    QRS Duration: 94 QT Interval:  437 QTC Calculation: 501 R Axis:   6 Text Interpretation: Sinus rhythm Left atrial enlargement Borderline prolonged QT interval Baseline wander in lead(s) V2 V6 Artifact Confirmed by Fredia Sorrow [phone]) on 09/08/2019 4:10:43 PM        Radiology DG Shoulder Right  Result Date: 09/08/2019 CLINICAL DATA:  Right shoulder pain, no injury. EXAM: RIGHT SHOULDER - 2+ VIEW COMPARISON:  None. FINDINGS: No acute osseous or joint abnormality. There may be mild degenerative changes in the right acromioclavicular joint. Visualized portion of the right chest is unremarkable. IMPRESSION: No acute findings. There may  be mild degenerative changes in the right acromioclavicular joint. Electronically Signed   By: Lorin Picket M.D.   On: 09/08/2019 15:07   DG Elbow Complete Right  Result Date: 09/08/2019 CLINICAL DATA:  Arm pain, no injury. EXAM: RIGHT ELBOW - COMPLETE 3+ VIEW COMPARISON:  None. FINDINGS: No acute osseous or joint abnormality. No degenerative changes. There may be mild soft tissue swelling over the olecranon. IMPRESSION: 1. No acute osseous or joint abnormality. 2. Question mild olecranon bursitis. Electronically Signed   By: Lorin Picket M.D.   On: 09/08/2019 15:08    Procedures Procedures (including critical care time)  Medications Ordered in ED Medications - No data to display  ED Course  I have reviewed the triage vital signs and the nursing notes.  Pertinent labs & imaging results that were available during my care of the patient were reviewed by me and considered in my medical  decision making (see chart for details).    MDM Rules/Calculators/A&P                      Patient seen and examined. Patient presents awake, alert, hemodynamically stable, afebrile, non toxic.  On exam she has full range of motion of right upper extremity.  She is neurovascularly intact.  She states the pain is shooting from her neck down to her forearm.  She is full range of motion of her neck, no midline cervical tenderness.  No overlying skin changes or spasms noted.  Also has full range of motion of right shoulder, elbow and wrist.  No overlying skin changes.  No swelling or erythema noted to elbow.  Low suspicion for septic joint or infection. Given her history EKG was performed and is without STEMI, no ischemia.  She is denying any chest pain or shortness of breath. Chem-8 ordered in triage, I viewed results which are consistent with her baseline.  X-ray of right shoulder viewed by me shows no signs of dislocation or fracture. There are mild degenerative changes in the right acromioclavicular joint. Xray of right elbow also viewed by me without acute findings, no fracture or dislocation.  Radiologist comments that there is questionable mild olecranon bursitis. No swelling appreciated on my exam. Patient updated on results including possible olecranon bursitis. Her exam is suggestive of cervical radiculopathy.  Given her reassuring exam and films today will discharge home with symptomatic care.  Will discharge with short burst of low-dose prednisone.  Discussed with patient and she needs to closely monitor her blood sugar and will stop taking prednisone if blood sugars are elevated.  She avoids anti-inflammatories secondary to her ESRD so will not recommend them at this time.  The patient appears reasonably screened and/or stabilized for discharge and I doubt any other medical condition or other [facility] requiring further screening, evaluation, or treatment in the ED at this time prior to discharge. The  patient is safe for discharge with strict return precautions. Recommend pcp follow up in 2-5 days for symptom recheck. Findings and plan of care discussed with supervising physician Dr. Laverta Baltimore.   Portions of this note were generated with [employer]. Dictation errors may occur despite best attempts at proofreading.   Final Clinical Impression(s) / ED Diagnoses Final diagnoses:  Right arm pain    Rx / DC Orders ED Discharge Orders         Ordered    predniSONE (DELTASONE) 10 MG tablet  Daily     09/08/19 1603  Cherre Robins, PA-C 09/08/19 1646    Margette Fast, MD 09/09/19 [phone]

## 2019-09-09 ENCOUNTER — Ambulatory Visit (HOSPITAL_COMMUNITY): Payer: Medicare Other | Admitting: Hematology

## 2019-09-10 ENCOUNTER — Ambulatory Visit (HOSPITAL_COMMUNITY): Payer: Medicare Other | Admitting: Hematology

## 2019-09-10 DIAGNOSIS — E1129 Type 2 diabetes mellitus with other diabetic kidney complication: Secondary | ICD-10-CM | POA: Diagnosis not present

## 2019-09-10 DIAGNOSIS — D631 Anemia in chronic kidney disease: Secondary | ICD-10-CM | POA: Diagnosis not present

## 2019-09-10 DIAGNOSIS — E119 Type 2 diabetes mellitus without complications: Secondary | ICD-10-CM | POA: Diagnosis not present

## 2019-09-10 DIAGNOSIS — Z992 Dependence on renal dialysis: Secondary | ICD-10-CM | POA: Diagnosis not present

## 2019-09-10 DIAGNOSIS — N186 End stage renal disease: Secondary | ICD-10-CM | POA: Diagnosis not present

## 2019-09-10 DIAGNOSIS — N2581 Secondary hyperparathyroidism of renal origin: Secondary | ICD-10-CM | POA: Diagnosis not present

## 2019-09-11 ENCOUNTER — Ambulatory Visit: Payer: Medicare Other | Admitting: Family Medicine

## 2019-09-12 ENCOUNTER — Other Ambulatory Visit: Payer: Self-pay

## 2019-09-12 ENCOUNTER — Encounter: Payer: Medicare Other | Admitting: Gastroenterology

## 2019-09-12 ENCOUNTER — Ambulatory Visit
Admission: RE | Admit: 2019-09-12 | Discharge: 2019-09-12 | Disposition: A | Discharge status: Home / Self Care | Payer: Medicare Other | Source: Ambulatory Visit | Attending: Hematology | Admitting: Hematology

## 2019-09-12 DIAGNOSIS — R921 Mammographic calcification found on diagnostic imaging of breast: Secondary | ICD-10-CM

## 2019-09-12 DIAGNOSIS — D0511 Intraductal carcinoma in situ of right breast: Secondary | ICD-10-CM | POA: Diagnosis not present

## 2019-09-12 DIAGNOSIS — R928 Other abnormal and inconclusive findings on diagnostic imaging of breast: Secondary | ICD-10-CM | POA: Diagnosis not present

## 2019-09-13 DIAGNOSIS — Z992 Dependence on renal dialysis: Secondary | ICD-10-CM | POA: Diagnosis not present

## 2019-09-13 DIAGNOSIS — D631 Anemia in chronic kidney disease: Secondary | ICD-10-CM | POA: Diagnosis not present

## 2019-09-13 DIAGNOSIS — N186 End stage renal disease: Secondary | ICD-10-CM | POA: Diagnosis not present

## 2019-09-13 DIAGNOSIS — E1129 Type 2 diabetes mellitus with other diabetic kidney complication: Secondary | ICD-10-CM | POA: Diagnosis not present

## 2019-09-13 DIAGNOSIS — N2581 Secondary hyperparathyroidism of renal origin: Secondary | ICD-10-CM | POA: Diagnosis not present

## 2019-09-13 DIAGNOSIS — E119 Type 2 diabetes mellitus without complications: Secondary | ICD-10-CM | POA: Diagnosis not present

## 2019-09-15 DIAGNOSIS — Z992 Dependence on renal dialysis: Secondary | ICD-10-CM | POA: Diagnosis not present

## 2019-09-15 DIAGNOSIS — N186 End stage renal disease: Secondary | ICD-10-CM | POA: Diagnosis not present

## 2019-09-15 DIAGNOSIS — N040 Nephrotic syndrome with minor glomerular abnormality: Secondary | ICD-10-CM | POA: Diagnosis not present

## 2019-09-16 DIAGNOSIS — Z23 Encounter for immunization: Secondary | ICD-10-CM | POA: Diagnosis not present

## 2019-09-16 DIAGNOSIS — N186 End stage renal disease: Secondary | ICD-10-CM | POA: Diagnosis not present

## 2019-09-16 DIAGNOSIS — Z992 Dependence on renal dialysis: Secondary | ICD-10-CM | POA: Diagnosis not present

## 2019-09-16 DIAGNOSIS — E1129 Type 2 diabetes mellitus with other diabetic kidney complication: Secondary | ICD-10-CM | POA: Diagnosis not present

## 2019-09-16 DIAGNOSIS — E119 Type 2 diabetes mellitus without complications: Secondary | ICD-10-CM | POA: Diagnosis not present

## 2019-09-16 DIAGNOSIS — N2581 Secondary hyperparathyroidism of renal origin: Secondary | ICD-10-CM | POA: Diagnosis not present

## 2019-09-16 DIAGNOSIS — D631 Anemia in chronic kidney disease: Secondary | ICD-10-CM | POA: Diagnosis not present

## 2019-09-17 ENCOUNTER — Encounter: Payer: Self-pay | Admitting: Family Medicine

## 2019-09-17 ENCOUNTER — Ambulatory Visit (HOSPITAL_COMMUNITY): Payer: Medicare Other | Admitting: Nurse Practitioner

## 2019-09-17 ENCOUNTER — Other Ambulatory Visit: Payer: Self-pay

## 2019-09-17 ENCOUNTER — Ambulatory Visit (INDEPENDENT_AMBULATORY_CARE_PROVIDER_SITE_OTHER): Payer: Medicare Other | Admitting: Family Medicine

## 2019-09-17 VITALS — BP 140/76 | HR 98 | Temp 98.7°F | Resp 16 | Ht 62.0 in | Wt 168.0 lb

## 2019-09-17 DIAGNOSIS — B029 Zoster without complications: Secondary | ICD-10-CM | POA: Diagnosis not present

## 2019-09-17 MED ORDER — OXYCODONE-ACETAMINOPHEN 5-325 MG PO TABS: 1.0000 | ORAL_TABLET | Freq: Three times a day (TID) | ORAL | 0 refills | Status: DC | PRN

## 2019-09-17 MED ORDER — VALACYCLOVIR HCL 500 MG PO TABS: 500.0000 mg | ORAL_TABLET | Freq: Every day | ORAL | 0 refills | Status: DC

## 2019-09-17 NOTE — Progress Notes (Signed)
   Subjective:    Patient ID: Megan [name], female    DOB: 12/18/1960, 59 y.o.   MRN: [id number]  Patient presents for Rash (09/05/2019- blisters and pain to R hand and arm)  Pt Here with right arm pain.  She was seen in the emergency room on January 25.  There was no rash on the skin.  She had x-ray of her shoulder and her elbow done which showed some mild degenerative changes at the [facility] joint.  X-ray of the elbow showed questionable mild olecranon bursitis.  She was prescribed prednisone secondary to her underlying history of end-stage renal disease, heart failure, hypertension,diabetes. She states a few days later she popped out with a rash on her right forearm as well as the base of her right palm.  It is tingling and burning.  She does not have any rash anywhere else. Prednisone did help with the shoulder discomfort and elbow pain. She is already on gabapentin 300 mg at bedtime and she is already on Cymbalta 30 mg.  T/ th/ sat Dr.Coladanto neurologist    Review Of Systems:  GEN- denies fatigue, fever, weight loss,weakness, recent illness HEENT- denies eye drainage, change in vision, nasal discharge, CVS- denies chest pain, palpitations RESP- denies SOB, cough, wheeze ABD- denies N/V, change in stools, abd pain GU- denies dysuria, hematuria, dribbling, incontinence MSK- denies joint pain, +muscle aches, injury Neuro- denies headache, dizziness, syncope, seizure activity       Objective:    BP 140/76   Pulse 98   Temp 98.7 F (37.1 C) (Temporal)   Resp 16   Ht 5\' 2"  (1.575 m)   Wt 168 lb (76.2 kg)   SpO2 98%   BMI 30.73 kg/m  GEN- NAD, alert and oriented x3 HEENT- PERRL, EOMI, non injected sclera, pink conjunctiva CVS- RRR, no murmur RESP-CTAB Skin right forearm cluster small vesicles mild tender to palpation no erythema.  Base of her right palm cluster of vesicles with mild erythema around Musculoskeletal fair range of motion of the right upper extremity left  upper extremity.  No appreciable swelling of the elbow EXT- No edema Pulses- Radial,2+        Assessment & Plan:      Problem List Items Addressed This Visit    None    Visit Diagnoses    Herpes zoster without complication    -  Primary   We will treat with Valtrex 500 mg once a day she will take this in the evening so that it remains after dialysis on those days.  I have given her a short course of oxycodone acetaminophen for pain.  Because of her end-stage renal disease would not recommend increasing the dose of the gabapentin and we will also leave the Cymbalta at the current dose.  I did advise that she may have postherpetic neuralgia there is no way to really predict this.  Patient voiced understanding.  At this time there is no sign of superinfection of the shingles rash.   Relevant Medications   valACYclovir (VALTREX) 500 MG tablet      Note: This dictation was prepared with Dragon dictation along with smaller phrase technology. Any transcriptional errors that result from this process are unintentional.

## 2019-09-17 NOTE — Patient Instructions (Addendum)
Take Valtrex for shingles Keep covered if draining  Oxycodone for pain as needed F/U as needed   Shingles  Shingles is an infection. It gives you a painful skin rash and blisters that have fluid in them. Shingles is caused by the same germ (virus) that causes chickenpox. Shingles only happens in people who:  Have had chickenpox.  Have been given a shot of medicine (vaccine) to protect against chickenpox. Shingles is rare in this group. The first symptoms of shingles may be itching, tingling, or pain in an area on your skin. A rash will show on your skin a few days or weeks later. The rash is likely to be on one side of your body. The rash usually has a shape like a belt or a band. Over time, the rash turns into fluid-filled blisters. The blisters will break open, change into scabs, and dry up. Medicines may:  Help with pain and itching.  Help you get better sooner.  Help to prevent long-term problems. Follow these instructions at home: Medicines  Take over-the-counter and prescription medicines only as told by your doctor.  Put on an anti-itch cream or numbing cream where you have a rash, blisters, or scabs. Do this as told by your doctor. Helping with itching and discomfort   Put cold, wet cloths (cold compresses) on the area of the rash or blisters as told by your doctor.  Cool baths can help you feel better. Try adding baking soda or dry oatmeal to the water to lessen itching. Do not bathe in hot water. Blister and rash care  Keep your rash covered with a loose bandage (dressing).  Wear loose clothing that does not rub on your rash.  Keep your rash and blisters clean. To do this, wash the area with mild soap and cool water as told by your doctor.  Check your rash every day for signs of infection. Check for: ? More redness, swelling, or pain. ? Fluid or blood. ? Warmth. ? Pus or a bad smell.  Do not scratch your rash. Do not pick at your blisters. To help you to not  scratch: ? Keep your fingernails clean and cut short. ? Wear gloves or mittens when you sleep, if scratching is a problem. General instructions  Rest as told by your doctor.  Keep all follow-up visits as told by your doctor. This is important.  Wash your hands often with soap and water. If soap and water are not available, use hand sanitizer. Doing this lowers your chance of getting a skin infection caused by germs (bacteria).  Your infection can cause chickenpox in people who have never had chickenpox or never got a shot of chickenpox vaccine. If you have blisters that did not change into scabs yet, try not to touch other people or be around other people, especially: ? Babies. ? Pregnant women. ? Children who have areas of red, itchy, or rough skin (eczema). ? Very old people who have transplants. ? People who have a long-term (chronic) sickness, like cancer or AIDS. Contact a doctor if:  Your pain does not get better with medicine.  Your pain does not get better after the rash heals.  You have any signs of infection in the rash area. These signs include: ? More redness, swelling, or pain around the rash. ? Fluid or blood coming from the rash. ? The rash area feeling warm to the touch. ? Pus or a bad smell coming from the rash. Get help right away if:  The rash is on your face or nose.  You have pain in your face or pain by your eye.  You lose feeling on one side of your face.  You have trouble seeing.  You have ear pain, or you have ringing in your ear.  You have a loss of taste.  Your condition gets worse. Summary  Shingles gives you a painful skin rash and blisters that have fluid in them.  Shingles is an infection. It is caused by the same germ (virus) that causes chickenpox.  Keep your rash covered with a loose bandage (dressing). Wear loose clothing that does not rub on your rash.  If you have blisters that did not change into scabs yet, try not to touch  other people or be around people. This information is not intended to replace advice given to you by your health care provider. Make sure you discuss any questions you have with your health care provider. Document Revised: 11/22/2018 Document Reviewed: 04/04/2017 Elsevier Patient Education  2020 [employer].

## 2019-09-18 ENCOUNTER — Ambulatory Visit (INDEPENDENT_AMBULATORY_CARE_PROVIDER_SITE_OTHER): Payer: Medicare Other | Admitting: General Surgery

## 2019-09-18 ENCOUNTER — Encounter: Payer: Self-pay | Admitting: General Surgery

## 2019-09-18 VITALS — BP 134/78 | HR 76 | Temp 98.2°F | Resp 16 | Ht 62.0 in | Wt 166.0 lb

## 2019-09-18 DIAGNOSIS — Z170 Estrogen receptor positive status [ER+]: Secondary | ICD-10-CM

## 2019-09-18 DIAGNOSIS — N2581 Secondary hyperparathyroidism of renal origin: Secondary | ICD-10-CM | POA: Diagnosis not present

## 2019-09-18 DIAGNOSIS — N186 End stage renal disease: Secondary | ICD-10-CM | POA: Diagnosis not present

## 2019-09-18 DIAGNOSIS — E119 Type 2 diabetes mellitus without complications: Secondary | ICD-10-CM | POA: Diagnosis not present

## 2019-09-18 DIAGNOSIS — C50211 Malignant neoplasm of upper-inner quadrant of right female breast: Secondary | ICD-10-CM | POA: Diagnosis not present

## 2019-09-18 DIAGNOSIS — E1129 Type 2 diabetes mellitus with other diabetic kidney complication: Secondary | ICD-10-CM | POA: Diagnosis not present

## 2019-09-18 DIAGNOSIS — D631 Anemia in chronic kidney disease: Secondary | ICD-10-CM | POA: Diagnosis not present

## 2019-09-18 DIAGNOSIS — Z992 Dependence on renal dialysis: Secondary | ICD-10-CM | POA: Diagnosis not present

## 2019-09-19 ENCOUNTER — Other Ambulatory Visit: Payer: Self-pay

## 2019-09-19 ENCOUNTER — Encounter: Payer: Self-pay | Admitting: Gastroenterology

## 2019-09-19 ENCOUNTER — Ambulatory Visit (INDEPENDENT_AMBULATORY_CARE_PROVIDER_SITE_OTHER): Payer: Medicare Other | Admitting: Gastroenterology

## 2019-09-19 VITALS — BP 147/90 | HR 74 | Temp 96.9°F | Ht 62.0 in | Wt 167.4 lb

## 2019-09-19 DIAGNOSIS — K642 Third degree hemorrhoids: Secondary | ICD-10-CM

## 2019-09-19 NOTE — Progress Notes (Addendum)
Megan [name]; [id number]; 1960/11/15   HPI Patient is a 59 year old black female who was referred to my care by Dr. Dennard Schaumann, Dr. Delton Coombes, and the [facility] for evaluation treatment of a recurrent right breast cancer.  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  She underwent chemotherapy at that time.  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  Her original cancer was in the upper, outer quadrant of the right breast.  The new lesion was found in the upper, inner quadrant of the right breast.  Patient does not feel a lump.  She denies any breast pain.  She has 0 out of 10 pain. Past Medical History:  Diagnosis Date  . (HFpEF) heart failure with preserved ejection fraction ([place])    a. 01/2019 Echo: EF 55-60%, mild conc LVH. DD.  Torn MV chordae.  . Anemia   . Atypical chest pain    a. 08/2018 MV: EF 59%, no ischemia; b. 02/2019 Cath: nonobs dzs.  . Blood transfusion without reported diagnosis   . Cataract   . ESRD (end stage renal disease) on dialysis ([place])    a. HD T, T, S  . Essential hypertension, benign   . GERD (gastroesophageal reflux disease)   . Headache   . Hemorrhoids   . Mixed hyperlipidemia   . Morbid obesity ([place])   . Non-obstructive CAD (coronary artery disease)    a. 02/2019 CathL LM nl, LAD 52m, LCX nl, RCA 25p, EF 55-65%.  Marland Kitchen PONV (postoperative nausea and vomiting)   . S/P colonoscopy Jan 2011   Dr. Benson Norway: sessile polyp (benign lymphoid), large hemorrhoids, repeat 5-10 years  . Temporal arteritis ([place])   . Type 2 diabetes mellitus ([place])   . Wears glasses     Past Surgical History:  Procedure Laterality Date  . ABDOMINAL HYSTERECTOMY    . APPENDECTOMY    . ARTERY BIOPSY N/A 05/09/2018   Procedure: RIGHT TEMPORAL ARTERY BIOPSY;  Surgeon:  Judeth Horn, MD;  Location: [facility];  Service: General;  Laterality: N/A;  . CATARACT EXTRACTION W/PHACO Left 02/09/2017   Procedure: CATARACT EXTRACTION PHACO AND INTRAOCULAR LENS PLACEMENT LEFT EYE;  Surgeon: Tonny Branch, MD;  Location: [facility];  Service: Ophthalmology;  Laterality: Left;  CDE: 4.89  . CATARACT EXTRACTION W/PHACO Right 06/04/2017   Procedure: CATARACT EXTRACTION PHACO AND INTRAOCULAR LENS PLACEMENT (IOC);  Surgeon: Tonny Branch, MD;  Location: [facility];  Service: Ophthalmology;  Laterality: Right;  CDE: 4.12  . CHOLECYSTECTOMY  09/29/2011   Procedure: LAPAROSCOPIC CHOLECYSTECTOMY;  Surgeon: Jamesetta So, MD;  Location: [facility];  Service: General;  Laterality: N/A;  . COLONOSCOPY  Jan 2011   Dr. Benson Norway: sessile polyp (benign lymphoid), large hemorrhoids, repeat 5-10 years  . COLONOSCOPY N/A 06/12/2016   Procedure: COLONOSCOPY;  Surgeon: Daneil Dolin, MD;  [facility];  Service: Endoscopy;  Laterality: N/A;  1230   . ESOPHAGOGASTRODUODENOSCOPY  09/05/2011   [id number] hiatal hernia; remainder of exam normal. No explanation for patient's abdominal pain with today's examination  . ESOPHAGOGASTRODUODENOSCOPY N/A 12/17/2013   Dr. [name]: gastric erythema, erosion, mild chronic inflammation on path   . LAPAROSCOPIC APPENDECTOMY  09/29/2011   Procedure: APPENDECTOMY LAPAROSCOPIC;  Surgeon: Jamesetta So, MD;  Location: [facility];  Service: General;;  incidental appendectomy  . LEFT  HEART CATH AND CORONARY ANGIOGRAPHY N/A 02/28/2019   Procedure: LEFT HEART CATH AND CORONARY ANGIOGRAPHY;  Surgeon: Jettie Booze, MD;  Location: [facility];  Service: Cardiovascular;  Laterality: N/A;  . PARTIAL MASTECTOMY WITH NEEDLE LOCALIZATION AND AXILLARY SENTINEL LYMPH NODE BX Right 09/18/2018   Procedure: RIGHT PARTIAL MASTECTOMY AFTER NEEDLE LOCALIZATION, SENTINEL LYMPH NODE BIOPSY RIGHT AXILLA;  Surgeon: Aviva Signs, MD;  Location: [facility];  Service: General;  Laterality: Right;     Family History  Problem Relation Age of Onset  . Hypertension Mother   . Coronary artery disease Mother   . Diabetes Mother   . Hypertension Sister   . Coronary artery disease Sister   . Hypertension Brother   . Heart attack Father   . Hypertension Son   . Heart attack Maternal Aunt   . Hypertension Maternal Aunt   . Diabetes Maternal Aunt   . Heart attack Maternal Uncle   . Hypertension Maternal Uncle   . Diabetes Maternal Uncle   . Heart attack Paternal Aunt   . Hypertension Paternal Aunt   . Diabetes Paternal Aunt   . Heart attack Paternal Uncle   . Hypertension Paternal Uncle   . Diabetes Paternal Uncle   . Heart attack Maternal Grandmother   . Heart attack Maternal Grandfather   . Heart attack Paternal Grandmother   . Heart attack Paternal Grandfather   . Colon cancer Neg Hx     Current Outpatient Medications on File Prior to Visit  Medication Sig Dispense Refill  . amLODipine (NORVASC) 10 MG tablet Take 2 tablets (20 mg total) by mouth at bedtime. 60 tablet 3  . anastrozole (ARIMIDEX) 1 MG tablet TAKE 1 TABLET(1 MG) BY MOUTH DAILY 30 tablet 4  . aspirin EC 81 MG tablet Take 81 mg by mouth daily.    Marland Kitchen atorvastatin (LIPITOR) 20 MG tablet TAKE 1 TABLET DAILY 90 tablet 3  . B Complex-C-Folic Acid (DIALYVITE 710) 0.8 MG TABS Take 800 mg by mouth daily.     . cinacalcet (SENSIPAR) 30 MG tablet Take 2 tablets (60 mg total) by mouth daily. (Patient taking differently: Take 30 mg by mouth every other day. ) 90 tablet 0  . DULoxetine (CYMBALTA) 30 MG capsule Take 30 mg by mouth daily.     Marland Kitchen gabapentin (NEURONTIN) 300 MG capsule TAKE 1 CAPSULE(300 MG) BY MOUTH AT BEDTIME 90 capsule 3  . lanthanum (FOSRENOL) 1000 MG chewable tablet Chew 1,000 mg by mouth 2 (two) times daily with a meal.     . lidocaine-prilocaine (EMLA) cream APPLY SMALL AMOUNT TO ACCESS SITE (AVF) 1 TO 2 HOURS BEFORE DIALYSIS. COVER WITH OCCLUSIVE DRESSING (SARAN WRAP)    . losartan (COZAAR) 50 MG tablet  TAKE 1 TABLET DAILY (DISCONTINUE LOSARTAN 100 MG) 90 tablet 3  . nebivolol (BYSTOLIC) 10 MG tablet Take 1 tablet (10 mg total) by mouth daily. (Patient taking differently: Take 10 mg by mouth 2 (two) times daily. ) 90 tablet 0  . oxyCODONE-acetaminophen (PERCOCET) 5-325 MG tablet Take 1 tablet by mouth every 8 (eight) hours as needed for severe pain. 15 tablet 0  . pantoprazole (PROTONIX) 40 MG tablet TAKE 1 TABLET(40 MG) BY MOUTH DAILY 90 tablet 3  . tiZANidine (ZANAFLEX) 4 MG capsule TAKE 1 CAPSULE(4 MG) BY MOUTH TWICE DAILY WITH A MEAL. START 1 CAPSULE AT NIGHT FOR 2 WEEKS AND THEN TWICE DAILY (Patient taking differently: Take 4 mg by mouth 2 (two) times daily as needed. ) 60  capsule 0  . valACYclovir (VALTREX) 500 MG tablet Take 1 tablet (500 mg total) by mouth daily. After dialysis for shingles 7 tablet 0  . nitroGLYCERIN (NITROSTAT) 0.4 MG SL tablet Place 1 tablet (0.4 mg total) under the tongue every 5 (five) minutes as needed for chest pain. 25 tablet 3   No current facility-administered medications on file prior to visit.    Allergies  Allergen Reactions  . Hydrocodone     itching    Social History   Substance and Sexual Activity  Alcohol Use No    Social History   Tobacco Use  Smoking Status Never Smoker  Smokeless Tobacco Never Used    Review of Systems  Constitutional: Negative.   HENT: Negative.   Eyes: Negative.   Respiratory: Negative.   Cardiovascular: Negative.   Gastrointestinal: Negative.   Genitourinary: Negative.   Musculoskeletal: Negative.   Skin: Negative.   Neurological: Negative.   Endo/Heme/Allergies: Negative.   Psychiatric/Behavioral: Negative.     Objective   Vitals:   09/18/19 1007  BP: 134/78  Pulse: 76  Resp: 16  Temp: 98.2 F (36.8 C)  SpO2: 97%    Physical Exam Vitals reviewed.  Constitutional:      Appearance: Normal appearance. She is normal weight. She is not ill-appearing.  HENT:     Head: Normocephalic and  atraumatic.  Cardiovascular:     Rate and Rhythm: Normal rate and regular rhythm.     Heart sounds: Normal heart sounds. No murmur. No friction rub. No gallop.   Pulmonary:     Effort: Pulmonary effort is normal. No respiratory distress.     Breath sounds: Normal breath sounds. No stridor. No wheezing, rhonchi or rales.  Lymphadenopathy:     Cervical: No cervical adenopathy.  Skin:    General: Skin is warm and dry.  Neurological:     Mental Status: She is alert and oriented to person, place, and time.   Breast: Healed surgical scar noted in the upper, outer quadrant of the right breast with a core biopsy site healing well in the upper, inner quadrant of the right breast.  No nipple discharge or dimpling is noted.  The axilla is negative for palpable nodes.  Left breast examination reveals no dominant mass, nipple discharge, or dimpling.  The axilla is negative for palpable nodes.  Oncology notes reviewed  Assessment  Recurrent right breast cancer Plan   Patient understands that given the recurrent nature and the different quadrants of the right breast cancer, mastectomy is indicated.  She is interested in immediate reconstructive surgery to address this.  Will refer patient to Dr. Marla Roe of plastic surgery for further evaluation and treatment.  All her questions were answered.

## 2019-09-19 NOTE — Patient Instructions (Signed)
We will see you back in 6 months! Please call if you have any further bleeding, discomfort, prolapsing. You may need one additional banding.  Continue to avoid constipation, straining.   Will be thinking of you!  I enjoyed seeing you again today! As you know, I value our relationship and want to provide genuine, compassionate, and quality care. I welcome your feedback. If you receive a survey regarding your visit,  I greatly appreciate you taking time to fill this out. See you next time!  Annitta Needs, PhD, ANP-BC [facility]

## 2019-09-19 NOTE — Progress Notes (Signed)
  [facility] Banding Note:   59 year old female presenting with history of internal hemorrhoids, s/p colonoscopy in Oct 2017 with prominent hemorrhoids as source of rectal bleeding. She was recently seen by Dr. Glo Herring who found prolapsing internal hemorrhoids on exam. Presented for first banding in Dec 2020 with symptomatic Grade 3 hemorrhoids. She undergoes dialysis Tues/Thurs/Saturday. Previously banded the right posterior and left lateral. She returns today to discuss banding of right anterior.     The patient presents with symptomatic grade 3 hemorrhoids, unresponsive to maximal medical therapy, requesting rubber band ligation of her hemorrhoidal disease. All risks, benefits, and alternative forms of therapy were described and informed consent was obtained. She has noted significant improvement with the first 2 banding sessions.   The decision was made to band the right anterior internal hemorrhoid, and the [place] was used to perform band ligation without complication. Digital anorectal examination was then performed to assure proper positioning of the band, and no adjustment required. The patient was discharged home without pain or other issues. Dietary and behavioral recommendations were given,, along with follow-up instructions. The patient will return in 6 months for routine follow-up. I have asked her to call if any further symptoms, as she may benefit from neutral banding.   No complications were encountered and the patient tolerated the procedure well.  Annitta Needs, PhD, ANP-BC [facility]

## 2019-09-20 DIAGNOSIS — D631 Anemia in chronic kidney disease: Secondary | ICD-10-CM | POA: Diagnosis not present

## 2019-09-20 DIAGNOSIS — N2581 Secondary hyperparathyroidism of renal origin: Secondary | ICD-10-CM | POA: Diagnosis not present

## 2019-09-20 DIAGNOSIS — E119 Type 2 diabetes mellitus without complications: Secondary | ICD-10-CM | POA: Diagnosis not present

## 2019-09-20 DIAGNOSIS — Z992 Dependence on renal dialysis: Secondary | ICD-10-CM | POA: Diagnosis not present

## 2019-09-20 DIAGNOSIS — E1129 Type 2 diabetes mellitus with other diabetic kidney complication: Secondary | ICD-10-CM | POA: Diagnosis not present

## 2019-09-20 DIAGNOSIS — N186 End stage renal disease: Secondary | ICD-10-CM | POA: Diagnosis not present

## 2019-09-22 ENCOUNTER — Inpatient Hospital Stay (HOSPITAL_COMMUNITY): Payer: Medicare Other | Attending: Hematology | Admitting: Hematology

## 2019-09-22 ENCOUNTER — Other Ambulatory Visit: Payer: Self-pay

## 2019-09-22 ENCOUNTER — Encounter (HOSPITAL_COMMUNITY): Payer: Self-pay | Admitting: Hematology

## 2019-09-22 DIAGNOSIS — M8580 Other specified disorders of bone density and structure, unspecified site: Secondary | ICD-10-CM | POA: Diagnosis not present

## 2019-09-22 DIAGNOSIS — G629 Polyneuropathy, unspecified: Secondary | ICD-10-CM | POA: Insufficient documentation

## 2019-09-22 DIAGNOSIS — C50211 Malignant neoplasm of upper-inner quadrant of right female breast: Secondary | ICD-10-CM | POA: Diagnosis not present

## 2019-09-22 DIAGNOSIS — E119 Type 2 diabetes mellitus without complications: Secondary | ICD-10-CM | POA: Diagnosis not present

## 2019-09-22 DIAGNOSIS — Z90710 Acquired absence of both cervix and uterus: Secondary | ICD-10-CM | POA: Diagnosis not present

## 2019-09-22 DIAGNOSIS — Z79899 Other long term (current) drug therapy: Secondary | ICD-10-CM | POA: Insufficient documentation

## 2019-09-22 DIAGNOSIS — Z992 Dependence on renal dialysis: Secondary | ICD-10-CM | POA: Insufficient documentation

## 2019-09-22 DIAGNOSIS — Z170 Estrogen receptor positive status [ER+]: Secondary | ICD-10-CM | POA: Insufficient documentation

## 2019-09-22 DIAGNOSIS — Z7982 Long term (current) use of aspirin: Secondary | ICD-10-CM | POA: Insufficient documentation

## 2019-09-22 DIAGNOSIS — I10 Essential (primary) hypertension: Secondary | ICD-10-CM | POA: Insufficient documentation

## 2019-09-22 DIAGNOSIS — Z9221 Personal history of antineoplastic chemotherapy: Secondary | ICD-10-CM | POA: Insufficient documentation

## 2019-09-22 DIAGNOSIS — N186 End stage renal disease: Secondary | ICD-10-CM | POA: Insufficient documentation

## 2019-09-22 NOTE — Progress Notes (Deleted)
[facility] NOTE  Patient Care Team: [name], MD as PCP - General (Family Medicine) [name], [name], MD as PCP - Cardiology (Cardiology) [name] [name], MD (Gastroenterology) [name], MD as Consulting Physician (Cardiology)  CHIEF COMPLAINTS/PURPOSE OF CONSULTATION:  Newly diagnosed breast cancer  HISTORY OF PRESENTING ILLNESS:  Megan [name] 59 y.o. female is here because of recent diagnosis of {left/right:[id number]}   I reviewed her records extensively and collaborated the history with the patient.  SUMMARY OF ONCOLOGIC HISTORY: Oncology History  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[place])  09/18/2018 Initial Diagnosis   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[place])   09/26/2018 Cancer Staging   Staging form: Breast, AJCC 8th Edition - Clinical stage from 09/26/2018: Stage IB (cT1c, cN0, cM0, G3, ER+, PR-, HER2-) - Signed by Derek Jack, MD on 09/26/2018   10/30/2018 -  Chemotherapy   The patient had DOXOrubicin (ADRIAMYCIN) chemo injection 108 mg, 60 mg/m2 = 108 mg, Intravenous,  Once, 4 of 4 cycles Administration: 108 mg (10/30/2018), 108 mg (11/20/2018), 108 mg (12/11/2018), 108 mg (01/01/2019) palonosetron (ALOXI) injection 0.25 mg, 0.25 mg, Intravenous,  Once, 4 of 4 cycles Administration: 0.25 mg (10/30/2018), 0.25 mg (11/20/2018), 0.25 mg (12/11/2018), 0.25 mg (01/01/2019) pegfilgrastim (NEULASTA ONPRO KIT) injection 6 mg, 6 mg, Subcutaneous, Once, 4 of 4 cycles Administration: 6 mg (10/30/2018), 6 mg (11/20/2018), 6 mg (12/11/2018), 6 mg (01/01/2019) cyclophosphamide (CYTOXAN) 820 mg in sodium chloride 0.9 % 250 mL chemo infusion, 450 mg/m2 = 820 mg (75 % of original dose 600 mg/m2), Intravenous,  Once, 4 of 4 cycles Dose modification: 450 mg/m2 (75 % of original dose 600 mg/m2, Cycle 1, Reason: Other (see comments), Comment: hemodialysis) Administration: 820 mg (10/30/2018),  820 mg (11/20/2018), 820 mg (12/11/2018), 820 mg (01/01/2019) fosaprepitant (EMEND) 150 mg, dexamethasone (DECADRON) 12 mg in sodium chloride 0.9 % 145 mL IVPB, , Intravenous,  Once, 4 of 4 cycles Administration:  (10/30/2018),  (11/20/2018),  (12/11/2018),  (01/01/2019)  for chemotherapy treatment.      In terms of breast cancer risk profile:  She menarched at early age of *** and went to menopause at age ***  She had *** pregnancy, her first child was born at age ***  She *** received birth control pills for approximately ***.  She was ***never exposed to fertility medications or hormone replacement therapy.  She has *** family history of Breast/GYN/GI cancer  MEDICAL HISTORY:  Past Medical History:  Diagnosis Date  . (HFpEF) heart failure with preserved ejection fraction ([place])    a. 01/2019 Echo: EF 55-60%, mild conc LVH. DD.  Torn MV chordae.  . Anemia   . Atypical chest pain    a. 08/2018 MV: EF 59%, no ischemia; b. 02/2019 Cath: nonobs dzs.  . Blood transfusion without reported diagnosis   . Cataract   . ESRD (end stage renal disease) on dialysis ([place])    a. HD T, T, S  . Essential hypertension, benign   . GERD (gastroesophageal reflux disease)   . Headache   . Hemorrhoids   . Mixed hyperlipidemia   . Morbid obesity ([place])   . Non-obstructive CAD (coronary artery disease)    a. 02/2019 CathL LM nl, LAD 22m LCX nl, RCA 25p, EF 55-65%.  .Marland KitchenPONV (postoperative nausea and vomiting)   . S/P colonoscopy Jan 2011   Dr. HBenson Norway sessile polyp (benign lymphoid), large hemorrhoids, repeat 5-10 years  . Temporal  arteritis ([place])   . Type 2 diabetes mellitus ([place])   . Wears glasses     SURGICAL HISTORY: Past Surgical History:  Procedure Laterality Date  . ABDOMINAL HYSTERECTOMY    . APPENDECTOMY    . ARTERY BIOPSY N/A 05/09/2018   Procedure: RIGHT TEMPORAL ARTERY BIOPSY;  Surgeon: Judeth Horn, MD;  Location: [facility];  Service: General;  Laterality: N/A;  . CATARACT EXTRACTION W/PHACO Left  02/09/2017   Procedure: CATARACT EXTRACTION PHACO AND INTRAOCULAR LENS PLACEMENT LEFT EYE;  Surgeon: Tonny Branch, MD;  Location: [facility];  Service: Ophthalmology;  Laterality: Left;  CDE: 4.89  . CATARACT EXTRACTION W/PHACO Right 06/04/2017   Procedure: CATARACT EXTRACTION PHACO AND INTRAOCULAR LENS PLACEMENT (IOC);  Surgeon: Tonny Branch, MD;  Location: [facility];  Service: Ophthalmology;  Laterality: Right;  CDE: 4.12  . CHOLECYSTECTOMY  09/29/2011   Procedure: LAPAROSCOPIC CHOLECYSTECTOMY;  Surgeon: Jamesetta So, MD;  Location: [facility];  Service: General;  Laterality: N/A;  . COLONOSCOPY  Jan 2011   Dr. Benson Norway: sessile polyp (benign lymphoid), large hemorrhoids, repeat 5-10 years  . COLONOSCOPY N/A 06/12/2016   Procedure: COLONOSCOPY;  Surgeon: Daneil Dolin, MD;  [facility];  Service: Endoscopy;  Laterality: N/A;  1230   . ESOPHAGOGASTRODUODENOSCOPY  09/05/2011   GYK:ZLDJT hiatal hernia; remainder of exam normal. No explanation for patient's abdominal pain with today's examination  . ESOPHAGOGASTRODUODENOSCOPY N/A 12/17/2013   Dr. [name]: gastric erythema, erosion, mild chronic inflammation on path   . LAPAROSCOPIC APPENDECTOMY  09/29/2011   Procedure: APPENDECTOMY LAPAROSCOPIC;  Surgeon: Jamesetta So, MD;  Location: [facility];  Service: General;;  incidental appendectomy  . LEFT HEART CATH AND CORONARY ANGIOGRAPHY N/A 02/28/2019   Procedure: LEFT HEART CATH AND CORONARY ANGIOGRAPHY;  Surgeon: Jettie Booze, MD;  Location: [facility];  Service: Cardiovascular;  Laterality: N/A;  . PARTIAL MASTECTOMY WITH NEEDLE LOCALIZATION AND AXILLARY SENTINEL LYMPH NODE BX Right 09/18/2018   Procedure: RIGHT PARTIAL MASTECTOMY AFTER NEEDLE LOCALIZATION, SENTINEL LYMPH NODE BIOPSY RIGHT AXILLA;  Surgeon: Aviva Signs, MD;  Location: [facility];  Service: General;  Laterality: Right;    SOCIAL HISTORY: Social History   Socioeconomic History  . Marital status: Married    Spouse name: Not on  file  . Number of children: Not on file  . Years of education: Not on file  . Highest education level: Not on file  Occupational History  . 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[id number]  Tobacco Use  . Smoking status: Never Smoker  . Smokeless tobacco: Never Used  Substance and Sexual Activity  . Alcohol use: No  . Drug use: No  . Sexual activity: Yes    Birth control/protection: Surgical  Other Topics Concern  . Not on file  Social History Narrative   Works at [employer] in [place].    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   Also has to get items from high shelves-causes achy pain in shoulder area      Married.   Children are grown, out of house.   Social Determinants of Health   Financial Resource Strain: Low Risk   . Difficulty of Paying Living Expenses: Not hard at all  Food Insecurity: No Food Insecurity  . Worried About Charity fundraiser in the Last Year: Never true  . Ran Out of Food in the Last Year: Never true  Transportation Needs: No Transportation Needs  .  Lack of Transportation (Medical): No  . Lack of Transportation (Non-Medical): No  Physical Activity: Inactive  . Days of Exercise per Week: 0 days  . Minutes of Exercise per Session: 0 min  Stress: No Stress Concern Present  . Feeling of Stress : Not at all  Social Connections: Slightly Isolated  . Frequency of Communication with Friends and Family: Twice a week  . Frequency of Social Gatherings with Friends and Family: Twice a week  . Attends Religious Services: More than 4 times per year  . Active Member of Clubs or Organizations: No  . Attends Archivist Meetings: Never  . Marital Status: Married  Human resources officer Violence: Not At Risk  . Fear of Current or Ex-Partner: No  . Emotionally Abused: No  . Physically Abused: No  . Sexually Abused: No    FAMILY HISTORY: Family History  Problem Relation Age of Onset  . Hypertension Mother   . Coronary artery disease  Mother   . Diabetes Mother   . Hypertension Sister   . Coronary artery disease Sister   . Hypertension Brother   . Heart attack Father   . Hypertension Son   . Heart attack Maternal Aunt   . Hypertension Maternal Aunt   . Diabetes Maternal Aunt   . Heart attack Maternal Uncle   . Hypertension Maternal Uncle   . Diabetes Maternal Uncle   . Heart attack Paternal Aunt   . Hypertension Paternal Aunt   . Diabetes Paternal Aunt   . Heart attack Paternal Uncle   . Hypertension Paternal Uncle   . Diabetes Paternal Uncle   . Heart attack Maternal Grandmother   . Heart attack Maternal Grandfather   . Heart attack Paternal Grandmother   . Heart attack Paternal Grandfather   . Colon cancer Neg Hx     ALLERGIES:  is allergic to hydrocodone.  MEDICATIONS:  Current Outpatient Medications  Medication Sig Dispense Refill  . amLODipine (NORVASC) 10 MG tablet Take 2 tablets (20 mg total) by mouth at bedtime. 60 tablet 3  . anastrozole (ARIMIDEX) 1 MG tablet TAKE 1 TABLET(1 MG) BY MOUTH DAILY 30 tablet 4  . aspirin EC 81 MG tablet Take 81 mg by mouth daily.    Marland Kitchen atorvastatin (LIPITOR) 20 MG tablet TAKE 1 TABLET DAILY 90 tablet 3  . B Complex-C-Folic Acid (DIALYVITE 382) 0.8 MG TABS Take 800 mg by mouth daily.     . cinacalcet (SENSIPAR) 30 MG tablet Take 2 tablets (60 mg total) by mouth daily. (Patient taking differently: Take 30 mg by mouth daily. ) 90 tablet 0  . DULoxetine (CYMBALTA) 30 MG capsule Take 30 mg by mouth daily.     Marland Kitchen gabapentin (NEURONTIN) 300 MG capsule TAKE 1 CAPSULE(300 MG) BY MOUTH AT BEDTIME 90 capsule 3  . lanthanum (FOSRENOL) 1000 MG chewable tablet Chew 1,000 mg by mouth 2 (two) times daily with a meal.     . losartan (COZAAR) 50 MG tablet TAKE 1 TABLET DAILY (DISCONTINUE LOSARTAN 100 MG) 90 tablet 3  . nebivolol (BYSTOLIC) 10 MG tablet Take 1 tablet (10 mg total) by mouth daily. (Patient taking differently: Take 10 mg by mouth 2 (two) times daily. ) 90 tablet 0  .  pantoprazole (PROTONIX) 40 MG tablet TAKE 1 TABLET(40 MG) BY MOUTH DAILY 90 tablet 3  . valACYclovir (VALTREX) 500 MG tablet Take 1 tablet (500 mg total) by mouth daily. After dialysis for shingles 7 tablet 0  . lidocaine-prilocaine (EMLA) cream APPLY  SMALL AMOUNT TO ACCESS SITE (AVF) 1 TO 2 HOURS BEFORE DIALYSIS. COVER WITH OCCLUSIVE DRESSING (SARAN WRAP)    . nitroGLYCERIN (NITROSTAT) 0.4 MG SL tablet Place 1 tablet (0.4 mg total) under the tongue every 5 (five) minutes as needed for chest pain. (Patient not taking: Reported on 09/22/2019) 25 tablet 3  . oxyCODONE-acetaminophen (PERCOCET) 5-325 MG tablet Take 1 tablet by mouth every 8 (eight) hours as needed for severe pain. (Patient not taking: Reported on 09/22/2019) 15 tablet 0  . tiZANidine (ZANAFLEX) 4 MG capsule TAKE 1 CAPSULE(4 MG) BY MOUTH TWICE DAILY WITH A MEAL. START 1 CAPSULE AT NIGHT FOR 2 WEEKS AND THEN TWICE DAILY (Patient not taking: Reported on 09/22/2019) 60 capsule 0   No current facility-administered medications for this visit.    REVIEW OF SYSTEMS:   Constitutional: Denies fevers, chills or abnormal night sweats Eyes: Denies blurriness of vision, double vision or watery eyes Ears, nose, mouth, throat, and face: Denies mucositis or sore throat Respiratory: Denies cough, dyspnea or wheezes Cardiovascular: Denies palpitation, chest discomfort or lower extremity swelling Gastrointestinal:  Denies nausea, heartburn or change in bowel habits Skin: Denies abnormal skin rashes Lymphatics: Denies new lymphadenopathy or easy bruising Neurological:Denies numbness, tingling or new weaknesses Behavioral/Psych: Mood is stable, no new changes  Breast: *** Denies any palpable lumps or discharge All other systems were reviewed with the patient and are negative.  PHYSICAL EXAMINATION: ECOG PERFORMANCE STATUS: {CHL ONC ECOG JI:[id number]}  Vitals:   09/22/19 1400  BP: 125/65  Pulse: 84  Resp: 18  Temp: (!) 96.9 F (36.1 C)  SpO2: 99%    Filed Weights   09/22/19 1400  Weight: 173 lb 11.2 oz (78.8 kg)    GENERAL:alert, no distress and comfortable SKIN: skin color, texture, turgor are normal, no rashes or significant lesions EYES: normal, conjunctiva are pink and non-injected, sclera clear OROPHARYNX:no exudate, no erythema and lips, buccal mucosa, and tongue normal  NECK: supple, thyroid normal size, non-tender, without nodularity LYMPH:  no palpable lymphadenopathy in the cervical, axillary or inguinal LUNGS: clear to auscultation and percussion with normal breathing effort HEART: regular rate & rhythm and no murmurs and no lower extremity edema ABDOMEN:abdomen soft, non-tender and normal bowel sounds Musculoskeletal:no cyanosis of digits and no clubbing  PSYCH: alert & oriented x 3 with fluent speech NEURO: no focal motor/sensory deficits BREAST:*** No palpable nodules in breast. No palpable axillary or supraclavicular lymphadenopathy (exam performed in the presence of a chaperone)   LABORATORY DATA:  I have reviewed the data as listed Lab Results  Component Value Date   WBC 8.3 09/02/2019   HGB 12.9 09/08/2019   HCT 38.0 09/08/2019   MCV 102.2 (H) 09/02/2019   PLT 284 09/02/2019   Lab Results  Component Value Date   NA 137 09/08/2019   K 3.6 09/08/2019   CL 94 (L) 09/08/2019   CO2 29 09/02/2019    RADIOGRAPHIC STUDIES: I have personally reviewed the radiological reports and agreed with the findings in the report.  ASSESSMENT AND PLAN:  No problem-specific Assessment & Plan notes found for this encounter.   All questions were answered. The patient knows to call the clinic with any problems, questions or concerns.    Donia Ast, LPN 73/73/66

## 2019-09-22 NOTE — Assessment & Plan Note (Addendum)
1.  Stage Ib (T1CN0) right breast cancer, ER positive, PR and HER-2 negative: - Right breast biopsy on 08/27/2026 1:00 consistent with IDC. -Right lumpectomy on 09/18/1998 20-1.5 cm IDC, grade 3, associated high-grade DCIS, negative margins, 0/3 lymph nodes positive, ER 50%, PR negative and HER-2 negative, Ki-67 60%. -Oncotype DX recurrence score 47.  Distant recurrence at 9 years with tamoxifen alone is 36%.  Absolute chemotherapy benefit is more than 15%. - Adjuvant chemotherapy with 4 cycles of AC from 10/30/2018 through 01/01/2019. -She is tolerating anastrozole very well.  Occasional minor hot flashes. -She had an abnormal mammogram on 08/21/2019 in the right breast at 1 o'clock position. -Biopsy on 09/12/2019 showed ER 30% positive, PR negative, high-grade DCIS. -She met with Dr. Arnoldo Morale and discussed surgical options.  She was recommended to have mastectomy.  She would like to have immediate reconstruction. -She is seeing Dr. Marla Roe in the next week or 2. -I have recommended her to see me back 4 weeks after surgery.  2.  Osteopenia: -Bone density on 01/20/2019 shows T score of -1.9. -She will continue calcium and vitamin D.  I plan to repeat bone density in 2 years.  3.  ESRD on HD: -She is continuing hemodialysis without any problems.  4.  Neuropathy: -She has neuropathy in the past year, etiology unknown.  She has burning sensation in the bottom of the feet, predominantly in the toes.  No neuropathic symptoms in the hands.

## 2019-09-22 NOTE — Progress Notes (Signed)
[place], [place] [address]   CLINIC:  Medical Oncology/Hematology  PCP:  [name], MD [address] [place] [address] [phone]   REASON FOR VISIT:  Follow-up for breast cancer    BRIEF ONCOLOGIC HISTORY:  Oncology History  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[place])  09/18/2018 Initial Diagnosis   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[place])   09/26/2018 Cancer Staging   Staging form: Breast, AJCC 8th Edition - Clinical stage from 09/26/2018: Stage IB (cT1c, cN0, cM0, G3, ER+, PR-, HER2-) - Signed by Derek Jack, MD on 09/26/2018   10/30/2018 -  Chemotherapy   The patient had DOXOrubicin (ADRIAMYCIN) chemo injection 108 mg, 60 mg/m2 = 108 mg, Intravenous,  Once, 4 of 4 cycles Administration: 108 mg (10/30/2018), 108 mg (11/20/2018), 108 mg (12/11/2018), 108 mg (01/01/2019) palonosetron (ALOXI) injection 0.25 mg, 0.25 mg, Intravenous,  Once, 4 of 4 cycles Administration: 0.25 mg (10/30/2018), 0.25 mg (11/20/2018), 0.25 mg (12/11/2018), 0.25 mg (01/01/2019) pegfilgrastim (NEULASTA ONPRO KIT) injection 6 mg, 6 mg, Subcutaneous, Once, 4 of 4 cycles Administration: 6 mg (10/30/2018), 6 mg (11/20/2018), 6 mg (12/11/2018), 6 mg (01/01/2019) cyclophosphamide (CYTOXAN) 820 mg in sodium chloride 0.9 % 250 mL chemo infusion, 450 mg/m2 = 820 mg (75 % of original dose 600 mg/m2), Intravenous,  Once, 4 of 4 cycles Dose modification: 450 mg/m2 (75 % of original dose 600 mg/m2, Cycle 1, Reason: Other (see comments), Comment: hemodialysis) Administration: 820 mg (10/30/2018), 820 mg (11/20/2018), 820 mg (12/11/2018), 820 mg (01/01/2019) fosaprepitant (EMEND) 150 mg, dexamethasone (DECADRON) 12 mg in sodium chloride 0.9 % 145 mL IVPB, , Intravenous,  Once, 4 of 4 cycles Administration:  (10/30/2018),  (11/20/2018),  (12/11/2018),  (01/01/2019)  for chemotherapy treatment.       CANCER  STAGING: Cancer Staging Malignant neoplasm of upper-inner quadrant of right female breast [facility]) Staging form: Breast, AJCC 8th Edition - Clinical stage from 09/26/2018: Stage IB (cT1c, cN0, cM0, G3, ER+, PR-, HER2-) - Signed by Derek Jack, MD on 09/26/2018    INTERVAL HISTORY:  [name] 59 y.o. female seen for follow-up of her right breast cancer.  She apparently had abnormal mammogram in the first week of January.  She had a biopsy of the right breast on 09/12/2019.  She is continuing dialysis without any major problems.  She has occasional dizziness which is stable.  Numbness in the right hand is also stable.  Appetite is 75%.  Energy levels are 50%.  REVIEW OF SYSTEMS:  Review of Systems  Neurological: Positive for dizziness and numbness.  All other systems reviewed and are negative.    PAST MEDICAL/SURGICAL HISTORY:  Past Medical History:  Diagnosis Date  . (HFpEF) heart failure with preserved ejection fraction ([place])    a. 01/2019 Echo: EF 55-60%, mild conc LVH. DD.  Torn MV chordae.  . Anemia   . Atypical chest pain    a. 08/2018 MV: EF 59%, no ischemia; b. 02/2019 Cath: nonobs dzs.  . Blood transfusion without reported diagnosis   . Cataract   . ESRD (end stage renal disease) on dialysis ([place])    a. HD T, T, S  . Essential hypertension, benign   . GERD (gastroesophageal reflux disease)   . Headache   . Hemorrhoids   . Mixed hyperlipidemia   . Morbid obesity ([place])   . Non-obstructive CAD (coronary artery disease)    a. 02/2019  CathL LM nl, LAD 38m LCX nl, RCA 25p, EF 55-65%.  .Marland KitchenPONV (postoperative nausea and vomiting)   . S/P colonoscopy Jan 2011   Dr. HBenson Norway sessile polyp (benign lymphoid), large hemorrhoids, repeat 5-10 years  . Temporal arteritis ([place]   . Type 2 diabetes mellitus ([place]   . Wears glasses    Past Surgical History:  Procedure Laterality Date  . ABDOMINAL HYSTERECTOMY    . APPENDECTOMY    . ARTERY BIOPSY N/A 05/09/2018   Procedure: RIGHT  TEMPORAL ARTERY BIOPSY;  Surgeon: WJudeth Horn MD;  [facility]  Service: General;  Laterality: N/A;  . CATARACT EXTRACTION W/PHACO Left 02/09/2017   Procedure: CATARACT EXTRACTION PHACO AND INTRAOCULAR LENS PLACEMENT LEFT EYE;  Surgeon: HTonny Branch MD;  Location: [facility];  Service: Ophthalmology;  Laterality: Left;  CDE: 4.89  . CATARACT EXTRACTION W/PHACO Right 06/04/2017   Procedure: CATARACT EXTRACTION PHACO AND INTRAOCULAR LENS PLACEMENT (IOC);  Surgeon: HTonny Branch MD;  Location: [facility];  Service: Ophthalmology;  Laterality: Right;  CDE: 4.12  . CHOLECYSTECTOMY  09/29/2011   Procedure: LAPAROSCOPIC CHOLECYSTECTOMY;  Surgeon: MJamesetta So MD;  Location: [facility];  Service: General;  Laterality: N/A;  . COLONOSCOPY  Jan 2011   Dr. HBenson Norway sessile polyp (benign lymphoid), large hemorrhoids, repeat 5-10 years  . COLONOSCOPY N/A 06/12/2016   Procedure: COLONOSCOPY;  Surgeon: RDaneil Dolin MD;  [facility];  Service: Endoscopy;  Laterality: N/A;  1230   . ESOPHAGOGASTRODUODENOSCOPY  09/05/2011   REXB:MWUXLhiatal hernia; remainder of exam normal. No explanation for patient's abdominal pain with today's examination  . ESOPHAGOGASTRODUODENOSCOPY N/A 12/17/2013   Dr. RGala Romney gastric erythema, erosion, mild chronic inflammation on path   . LAPAROSCOPIC APPENDECTOMY  09/29/2011   Procedure: APPENDECTOMY LAPAROSCOPIC;  Surgeon: MJamesetta So MD;  Location: [facility];  Service: General;;  incidental appendectomy  . LEFT HEART CATH AND CORONARY ANGIOGRAPHY N/A 02/28/2019   Procedure: LEFT HEART CATH AND CORONARY ANGIOGRAPHY;  Surgeon: VJettie Booze MD;  [facility];  Service: Cardiovascular;  Laterality: N/A;  . PARTIAL MASTECTOMY WITH NEEDLE LOCALIZATION AND AXILLARY SENTINEL LYMPH NODE BX Right 09/18/2018   Procedure: RIGHT PARTIAL MASTECTOMY AFTER NEEDLE LOCALIZATION, SENTINEL LYMPH NODE BIOPSY RIGHT AXILLA;  Surgeon: JAviva Signs MD;  Location: [facility];  Service:  General;  Laterality: Right;     SOCIAL HISTORY:  Social History   Socioeconomic History  . Marital status: Married    Spouse name: Not on file  . Number of children: Not on file  . Years of education: Not on file  . Highest education level: Not on file  Occupational History  . Occupation: Food LPublic house manager [id number]  Tobacco Use  . Smoking status: Never Smoker  . Smokeless tobacco: Never Used  Substance and Sexual Activity  . Alcohol use: No  . Drug use: No  . Sexual activity: Yes    Birth control/protection: Surgical  Other Topics Concern  . Not on file  Social History Narrative   [place] [employer]    When trucks come, she has to put items in their places.   Also has to get items from high shelves-causes achy pain in shoulder area      Married.   Children are grown, out of house.   Social Determinants of Health   Financial Resource Strain: Low Risk   . Difficulty of Paying Living Expenses: Not hard at all  Food Insecurity: No Food Insecurity  . Worried About Charity fundraiser in the Last Year: Never true  . Ran Out of Food in the Last Year: Never true  Transportation Needs: No Transportation Needs  . Lack of Transportation (Medical): No  . Lack of Transportation (Non-Medical): No  Physical Activity: Inactive  . Days of Exercise per Week: 0 days  . Minutes of Exercise per Session: 0 min  Stress: No Stress Concern Present  . Feeling of Stress : Not at all  Social Connections: Slightly Isolated  . Frequency of Communication with Friends and Family: Twice a week  . Frequency of Social Gatherings with Friends and Family: Twice a week  . Attends Religious Services: More than 4 times per year  . Active Member of Clubs or Organizations: No  . Attends Archivist Meetings: Never  . Marital Status: Married  Human resources officer Violence: Not At Risk  . Fear of Current or Ex-Partner: No  . Emotionally Abused: No  . Physically Abused: No   . Sexually Abused: No    FAMILY HISTORY:  Family History  Problem Relation Age of Onset  . Hypertension Mother   . Coronary artery disease Mother   . Diabetes Mother   . Hypertension Sister   . Coronary artery disease Sister   . Hypertension Brother   . Heart attack Father   . Hypertension Son   . Heart attack Maternal Aunt   . Hypertension Maternal Aunt   . Diabetes Maternal Aunt   . Heart attack Maternal Uncle   . Hypertension Maternal Uncle   . Diabetes Maternal Uncle   . Heart attack Paternal Aunt   . Hypertension Paternal Aunt   . Diabetes Paternal Aunt   . Heart attack Paternal Uncle   . Hypertension Paternal Uncle   . Diabetes Paternal Uncle   . Heart attack Maternal Grandmother   . Heart attack Maternal Grandfather   . Heart attack Paternal Grandmother   . Heart attack Paternal Grandfather   . Colon cancer Neg Hx     CURRENT MEDICATIONS:  Outpatient Encounter Medications as of 09/22/2019  Medication Sig Note  . amLODipine (NORVASC) 10 MG tablet Take 2 tablets (20 mg total) by mouth at bedtime.   Marland Kitchen anastrozole (ARIMIDEX) 1 MG tablet TAKE 1 TABLET(1 MG) BY MOUTH DAILY   . aspirin EC 81 MG tablet Take 81 mg by mouth daily.   Marland Kitchen atorvastatin (LIPITOR) 20 MG tablet TAKE 1 TABLET DAILY   . B Complex-C-Folic Acid (DIALYVITE 536) 0.8 MG TABS Take 800 mg by mouth daily.    . cinacalcet (SENSIPAR) 30 MG tablet Take 2 tablets (60 mg total) by mouth daily. (Patient taking differently: Take 30 mg by mouth daily. )   . DULoxetine (CYMBALTA) 30 MG capsule Take 30 mg by mouth daily.    Marland Kitchen gabapentin (NEURONTIN) 300 MG capsule TAKE 1 CAPSULE(300 MG) BY MOUTH AT BEDTIME   . lanthanum (FOSRENOL) 1000 MG chewable tablet Chew 1,000 mg by mouth 2 (two) times daily with a meal.    . losartan (COZAAR) 50 MG tablet TAKE 1 TABLET DAILY (DISCONTINUE LOSARTAN 100 MG)   . nebivolol (BYSTOLIC) 10 MG tablet Take 1 tablet (10 mg total) by mouth daily. (Patient taking differently: Take 10 mg by  mouth 2 (two) times daily. )   . pantoprazole (PROTONIX) 40 MG tablet TAKE 1 TABLET(40 MG) BY MOUTH DAILY   . valACYclovir (VALTREX) 500 MG tablet Take 1 tablet (500 mg total)  by mouth daily. After dialysis for shingles   . lidocaine-prilocaine (EMLA) cream APPLY SMALL AMOUNT TO ACCESS SITE (AVF) 1 TO 2 HOURS BEFORE DIALYSIS. COVER WITH OCCLUSIVE DRESSING (SARAN WRAP)   . nitroGLYCERIN (NITROSTAT) 0.4 MG SL tablet Place 1 tablet (0.4 mg total) under the tongue every 5 (five) minutes as needed for chest pain. (Patient not taking: Reported on 09/22/2019) 02/28/2019: Patient took 2 doses  . oxyCODONE-acetaminophen (PERCOCET) 5-325 MG tablet Take 1 tablet by mouth every 8 (eight) hours as needed for severe pain. (Patient not taking: Reported on 09/22/2019)   . tiZANidine (ZANAFLEX) 4 MG capsule TAKE 1 CAPSULE(4 MG) BY MOUTH TWICE DAILY WITH A MEAL. START 1 CAPSULE AT NIGHT FOR 2 WEEKS AND THEN TWICE DAILY (Patient not taking: Reported on 09/22/2019)    No facility-administered encounter medications on file as of 09/22/2019.    ALLERGIES:  Allergies  Allergen Reactions  . Hydrocodone     itching     PHYSICAL EXAM:  ECOG Performance status: 1  Vitals:   09/22/19 1400  BP: 125/65  Pulse: 84  Resp: 18  Temp: (!) 96.9 F (36.1 C)  SpO2: 99%   Filed Weights   09/22/19 1400  Weight: 173 lb 11.2 oz (78.8 kg)    Physical Exam Vitals reviewed.  Constitutional:      Appearance: Normal appearance.  Cardiovascular:     Rate and Rhythm: Normal rate and regular rhythm.     Heart sounds: Normal heart sounds.  Pulmonary:     Effort: Pulmonary effort is normal.     Breath sounds: Normal breath sounds.  Abdominal:     General: There is no distension.     Palpations: Abdomen is soft. There is no mass.  Skin:    General: Skin is warm.  Neurological:     General: No focal deficit present.     Mental Status: She is alert and oriented to person, place, and time.  Psychiatric:        Mood and  Affect: Mood normal.        Behavior: Behavior normal.    Right breast area of biopsy has a hematoma.  No other masses palpable.  LABORATORY DATA:  I have reviewed the labs as listed.  CBC    Component Value Date/Time   WBC 8.3 09/02/2019 1115   RBC 4.04 09/02/2019 1115   HGB 12.9 09/08/2019 1511   HCT 38.0 09/08/2019 1511   PLT 284 09/02/2019 1115   MCV 102.2 (H) 09/02/2019 1115   MCH 31.9 09/02/2019 1115   MCHC 31.2 09/02/2019 1115   RDW 16.1 (H) 09/02/2019 1115   LYMPHSABS 2.0 09/02/2019 1115   MONOABS 1.1 (H) 09/02/2019 1115   EOSABS 0.1 09/02/2019 1115   BASOSABS 0.1 09/02/2019 1115   CMP Latest Ref Rng & Units 09/08/2019 09/02/2019 06/02/2019  Glucose 70 - 99 mg/dL 112(H) 113(H) 112(H)  BUN 6 - 20 mg/dL 35(H) 55(H) 44(H)  Creatinine 0.44 - 1.00 mg/dL 7.30(H) 7.74(H) 6.98(H)  Sodium 135 - 145 mmol/L 137 138 138  Potassium 3.5 - 5.1 mmol/L 3.6 4.1 3.9  Chloride 98 - 111 mmol/L 94(L) 94(L) 101  CO2 22 - 32 mmol/L - 29 24  Calcium 8.9 - 10.3 mg/dL - 10.2 9.1  Total Protein 6.5 - 8.1 g/dL - 8.7(H) 7.1  Total Bilirubin 0.3 - 1.2 mg/dL - 0.7 1.0  Alkaline Phos 38 - 126 U/L - 127(H) 129(H)  AST 15 - 41 U/L - 29 24  ALT 0 -  44 U/L - 37 33       DIAGNOSTIC IMAGING:  I have independently reviewed the scans and discussed with the patient.   I have reviewed Venita Lick LPN's note and agree with the documentation.  I personally performed a face-to-face visit, made revisions and my assessment and plan is as follows.    ASSESSMENT & PLAN:   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) 1.  Stage Ib (T1CN0) right breast cancer, ER positive, PR and HER-2 negative: - Right breast biopsy on 08/27/2026 1:00 consistent with IDC. -Right lumpectomy on 09/18/1998 20-1.5 cm IDC, grade 3, associated high-grade DCIS, negative margins, 0/3 lymph nodes positive, ER 50%, PR negative and HER-2 negative, Ki-67 60%. -Oncotype DX recurrence score 47.  Distant  recurrence at 9 years with tamoxifen alone is 36%.  Absolute chemotherapy benefit is more than 15%. - Adjuvant chemotherapy with 4 cycles of AC from 10/30/2018 through 01/01/2019. -She is tolerating anastrozole very well.  Occasional minor hot flashes. -She had an abnormal mammogram on 08/21/2019 in the right breast at 1 o'clock position. -Biopsy on 09/12/2019 showed ER 30% positive, PR negative, high-grade DCIS. -She met with Dr. Arnoldo Morale and discussed surgical options.  She was recommended to have mastectomy.  She would like to have immediate reconstruction. -She is seeing Dr. Marla Roe in the next week or 2. -I have recommended her to see me back 4 weeks after surgery.  2.  Osteopenia: -Bone density on 01/20/2019 shows T score of -1.9. -She will continue calcium and vitamin D.  I plan to repeat bone density in 2 years.  3.  ESRD on HD: -She is continuing hemodialysis without any problems.  4.  Neuropathy: -She has neuropathy in the past year, etiology unknown.  She has burning sensation in the bottom of the feet, predominantly in the toes.  No neuropathic symptoms in the hands.     Orders placed this encounter:  No orders of the defined types were placed in this encounter.     Derek Jack, MD [place] [phone]

## 2019-09-22 NOTE — Patient Instructions (Signed)
[facility] Discharge Instructions  You were seen today by Dr. Delton Coombes. He went over your recent test results. Continue taking the Anastrozole. 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 He will see you back in 8 weeks for labs and follow up.   Thank you for choosing [facility] at [facility] to provide your oncology and hematology care.  To afford each patient quality time with our provider, please arrive at least 15 minutes before your scheduled appointment time.   If you have a lab appointment with the [facility] please come in thru the  Main Entrance and check in at the main information desk  You need to re-schedule your appointment should you arrive 10 or more minutes late.  We strive to give you quality time with our providers, and arriving late affects you and other patients whose appointments are after yours.  Also, if you no show three or more times for appointments you may be dismissed from the clinic at the providers discretion.     Again, thank you for choosing [facility].  Our hope is that these requests will decrease the amount of time that you wait before being seen by our physicians.       _____________________________________________________________  Should you have questions after your visit to [facility], please contact our office at [phone] between the hours of 8:00 a.m. and 4:30 p.m.  Voicemails left after 4:00 p.m. will not be returned until the following business day.  For prescription refill requests, have your pharmacy contact our office and allow 72 hours.    Cancer Center Support Programs:   > Cancer Support Group  2nd Tuesday of the month 1pm-2pm, Journey Room

## 2019-09-23 DIAGNOSIS — Z992 Dependence on renal dialysis: Secondary | ICD-10-CM | POA: Diagnosis not present

## 2019-09-23 DIAGNOSIS — N2581 Secondary hyperparathyroidism of renal origin: Secondary | ICD-10-CM | POA: Diagnosis not present

## 2019-09-23 DIAGNOSIS — N186 End stage renal disease: Secondary | ICD-10-CM | POA: Diagnosis not present

## 2019-09-23 DIAGNOSIS — E119 Type 2 diabetes mellitus without complications: Secondary | ICD-10-CM | POA: Diagnosis not present

## 2019-09-23 DIAGNOSIS — D631 Anemia in chronic kidney disease: Secondary | ICD-10-CM | POA: Diagnosis not present

## 2019-09-23 DIAGNOSIS — E1129 Type 2 diabetes mellitus with other diabetic kidney complication: Secondary | ICD-10-CM | POA: Diagnosis not present

## 2019-09-25 DIAGNOSIS — N186 End stage renal disease: Secondary | ICD-10-CM | POA: Diagnosis not present

## 2019-09-25 DIAGNOSIS — Z992 Dependence on renal dialysis: Secondary | ICD-10-CM | POA: Diagnosis not present

## 2019-09-25 DIAGNOSIS — E1129 Type 2 diabetes mellitus with other diabetic kidney complication: Secondary | ICD-10-CM | POA: Diagnosis not present

## 2019-09-25 DIAGNOSIS — N2581 Secondary hyperparathyroidism of renal origin: Secondary | ICD-10-CM | POA: Diagnosis not present

## 2019-09-25 DIAGNOSIS — D631 Anemia in chronic kidney disease: Secondary | ICD-10-CM | POA: Diagnosis not present

## 2019-09-25 DIAGNOSIS — E119 Type 2 diabetes mellitus without complications: Secondary | ICD-10-CM | POA: Diagnosis not present

## 2019-09-27 DIAGNOSIS — D631 Anemia in chronic kidney disease: Secondary | ICD-10-CM | POA: Diagnosis not present

## 2019-09-27 DIAGNOSIS — E1129 Type 2 diabetes mellitus with other diabetic kidney complication: Secondary | ICD-10-CM | POA: Diagnosis not present

## 2019-09-27 DIAGNOSIS — N186 End stage renal disease: Secondary | ICD-10-CM | POA: Diagnosis not present

## 2019-09-27 DIAGNOSIS — Z992 Dependence on renal dialysis: Secondary | ICD-10-CM | POA: Diagnosis not present

## 2019-09-27 DIAGNOSIS — N2581 Secondary hyperparathyroidism of renal origin: Secondary | ICD-10-CM | POA: Diagnosis not present

## 2019-09-27 DIAGNOSIS — E119 Type 2 diabetes mellitus without complications: Secondary | ICD-10-CM | POA: Diagnosis not present

## 2019-09-30 DIAGNOSIS — E119 Type 2 diabetes mellitus without complications: Secondary | ICD-10-CM | POA: Diagnosis not present

## 2019-09-30 DIAGNOSIS — N186 End stage renal disease: Secondary | ICD-10-CM | POA: Diagnosis not present

## 2019-09-30 DIAGNOSIS — Z992 Dependence on renal dialysis: Secondary | ICD-10-CM | POA: Diagnosis not present

## 2019-09-30 DIAGNOSIS — E1129 Type 2 diabetes mellitus with other diabetic kidney complication: Secondary | ICD-10-CM | POA: Diagnosis not present

## 2019-09-30 DIAGNOSIS — D631 Anemia in chronic kidney disease: Secondary | ICD-10-CM | POA: Diagnosis not present

## 2019-09-30 DIAGNOSIS — N2581 Secondary hyperparathyroidism of renal origin: Secondary | ICD-10-CM | POA: Diagnosis not present

## 2019-10-01 DIAGNOSIS — N186 End stage renal disease: Secondary | ICD-10-CM | POA: Diagnosis not present

## 2019-10-01 DIAGNOSIS — N2581 Secondary hyperparathyroidism of renal origin: Secondary | ICD-10-CM | POA: Diagnosis not present

## 2019-10-01 DIAGNOSIS — E1129 Type 2 diabetes mellitus with other diabetic kidney complication: Secondary | ICD-10-CM | POA: Diagnosis not present

## 2019-10-01 DIAGNOSIS — D631 Anemia in chronic kidney disease: Secondary | ICD-10-CM | POA: Diagnosis not present

## 2019-10-01 DIAGNOSIS — E119 Type 2 diabetes mellitus without complications: Secondary | ICD-10-CM | POA: Diagnosis not present

## 2019-10-01 DIAGNOSIS — Z992 Dependence on renal dialysis: Secondary | ICD-10-CM | POA: Diagnosis not present

## 2019-10-04 DIAGNOSIS — N2581 Secondary hyperparathyroidism of renal origin: Secondary | ICD-10-CM | POA: Diagnosis not present

## 2019-10-04 DIAGNOSIS — E119 Type 2 diabetes mellitus without complications: Secondary | ICD-10-CM | POA: Diagnosis not present

## 2019-10-04 DIAGNOSIS — E1129 Type 2 diabetes mellitus with other diabetic kidney complication: Secondary | ICD-10-CM | POA: Diagnosis not present

## 2019-10-04 DIAGNOSIS — N186 End stage renal disease: Secondary | ICD-10-CM | POA: Diagnosis not present

## 2019-10-04 DIAGNOSIS — D631 Anemia in chronic kidney disease: Secondary | ICD-10-CM | POA: Diagnosis not present

## 2019-10-04 DIAGNOSIS — Z992 Dependence on renal dialysis: Secondary | ICD-10-CM | POA: Diagnosis not present

## 2019-10-06 ENCOUNTER — Telehealth: Payer: Self-pay | Admitting: Plastic Surgery

## 2019-10-06 NOTE — Telephone Encounter (Signed)

## 2019-10-07 ENCOUNTER — Other Ambulatory Visit: Payer: Self-pay

## 2019-10-07 ENCOUNTER — Encounter: Payer: Self-pay | Admitting: Plastic Surgery

## 2019-10-07 ENCOUNTER — Ambulatory Visit (INDEPENDENT_AMBULATORY_CARE_PROVIDER_SITE_OTHER): Payer: Medicare Other | Admitting: Plastic Surgery

## 2019-10-07 VITALS — BP 142/78 | HR 87 | Temp 97.7°F | Ht 62.0 in | Wt 169.4 lb

## 2019-10-07 DIAGNOSIS — N186 End stage renal disease: Secondary | ICD-10-CM | POA: Diagnosis not present

## 2019-10-07 DIAGNOSIS — N2581 Secondary hyperparathyroidism of renal origin: Secondary | ICD-10-CM | POA: Diagnosis not present

## 2019-10-07 DIAGNOSIS — N183 Chronic kidney disease, stage 3 unspecified: Secondary | ICD-10-CM

## 2019-10-07 DIAGNOSIS — E1129 Type 2 diabetes mellitus with other diabetic kidney complication: Secondary | ICD-10-CM | POA: Diagnosis not present

## 2019-10-07 DIAGNOSIS — E119 Type 2 diabetes mellitus without complications: Secondary | ICD-10-CM | POA: Diagnosis not present

## 2019-10-07 DIAGNOSIS — Z992 Dependence on renal dialysis: Secondary | ICD-10-CM | POA: Diagnosis not present

## 2019-10-07 DIAGNOSIS — Z170 Estrogen receptor positive status [ER+]: Secondary | ICD-10-CM | POA: Diagnosis not present

## 2019-10-07 DIAGNOSIS — C50211 Malignant neoplasm of upper-inner quadrant of right female breast: Secondary | ICD-10-CM | POA: Diagnosis not present

## 2019-10-07 DIAGNOSIS — D631 Anemia in chronic kidney disease: Secondary | ICD-10-CM | POA: Diagnosis not present

## 2019-10-07 NOTE — Progress Notes (Signed)
Patient ID: Megan [name], female    DOB: 09/14/1960, 59 y.o.   MRN: [id number]   Chief Complaint  Patient presents with  . Breast Cancer    The patient is a 59 yrs old bf here with her husband for consultation for breast surgery.  She was diagnosed with right ductal carcinoma in situ.  Her previous surgery of the right breast was upper outer quadrant and was treated with a right partial mastectomy in February 2019.  It was estrogen positive and progesterone negative.  She did have chemotherapy.  The patient denies any radiation treatment.  This current breast cancer is upper inner quadrant.  The patient has multiple medical conditions including diabetes, heart disease and dialysis for kidney disease.  She has had an abdominal hysterectomy and an appendectomy as well as a cholecystectomy.  She has excellent support from her husband.  Her priority right now is getting to her family in [place] for the arrival of her grandbaby.   Review of Systems  Constitutional: Negative for activity change and appetite change.  HENT: Negative.   Eyes: Negative.   Respiratory: Negative.  Negative for chest tightness and shortness of breath.   Cardiovascular: Negative.   Gastrointestinal: Negative for abdominal distention.  Endocrine: Negative.   Genitourinary: Negative.   Musculoskeletal: Negative.   Hematological: Negative.   Psychiatric/Behavioral: Negative.     Past Medical History:  Diagnosis Date  . (HFpEF) heart failure with preserved ejection fraction ([place])    a. 01/2019 Echo: EF 55-60%, mild conc LVH. DD.  Torn MV chordae.  . Anemia   . Atypical chest pain    a. 08/2018 MV: EF 59%, no ischemia; b. 02/2019 Cath: nonobs dzs.  . Blood transfusion without reported diagnosis   . Cataract   . ESRD (end stage renal disease) on dialysis ([place])    a. HD T, T, S  . Essential hypertension, benign   . GERD (gastroesophageal reflux disease)   . Headache   . Hemorrhoids   . Mixed  hyperlipidemia   . Morbid obesity ([place])   . Non-obstructive CAD (coronary artery disease)    a. 02/2019 CathL LM nl, LAD 75m, LCX nl, RCA 25p, EF 55-65%.  Marland Kitchen PONV (postoperative nausea and vomiting)   . S/P colonoscopy Jan 2011   Dr. Benson Norway: sessile polyp (benign lymphoid), large hemorrhoids, repeat 5-10 years  . Temporal arteritis ([place])   . Type 2 diabetes mellitus ([place])   . Wears glasses     Past Surgical History:  Procedure Laterality Date  . ABDOMINAL HYSTERECTOMY    . APPENDECTOMY    . ARTERY BIOPSY N/A 05/09/2018   Procedure: RIGHT TEMPORAL ARTERY BIOPSY;  Surgeon: Judeth Horn, MD;  Location: [facility];  Service: General;  Laterality: N/A;  . CATARACT EXTRACTION W/PHACO Left 02/09/2017   Procedure: CATARACT EXTRACTION PHACO AND INTRAOCULAR LENS PLACEMENT LEFT EYE;  Surgeon: Tonny Branch, MD;  Location: [facility];  Service: Ophthalmology;  Laterality: Left;  CDE: 4.89  . CATARACT EXTRACTION W/PHACO Right 06/04/2017   Procedure: CATARACT EXTRACTION PHACO AND INTRAOCULAR LENS PLACEMENT (IOC);  Surgeon: Tonny Branch, MD;  Location: [facility];  Service: Ophthalmology;  Laterality: Right;  CDE: 4.12  . CHOLECYSTECTOMY  09/29/2011   Procedure: LAPAROSCOPIC CHOLECYSTECTOMY;  Surgeon: Jamesetta So, MD;  Location: [facility];  Service: General;  Laterality: N/A;  . COLONOSCOPY  Jan 2011   Dr. Benson Norway: sessile polyp (benign lymphoid), large hemorrhoids, repeat 5-10 years  . COLONOSCOPY N/A 06/12/2016  Procedure: COLONOSCOPY;  Surgeon: Daneil Dolin, MD;  [facility];  Service: Endoscopy;  Laterality: N/A;  1230   . ESOPHAGOGASTRODUODENOSCOPY  09/05/2011   EPP:IRJJO hiatal hernia; remainder of exam normal. No explanation for patient's abdominal pain with today's examination  . ESOPHAGOGASTRODUODENOSCOPY N/A 12/17/2013   Dr. [name]: gastric erythema, erosion, mild chronic inflammation on path   . LAPAROSCOPIC APPENDECTOMY  09/29/2011   Procedure: APPENDECTOMY LAPAROSCOPIC;  Surgeon: Jamesetta So, MD;   Location: [facility];  Service: General;;  incidental appendectomy  . LEFT HEART CATH AND CORONARY ANGIOGRAPHY N/A 02/28/2019   Procedure: LEFT HEART CATH AND CORONARY ANGIOGRAPHY;  Surgeon: Jettie Booze, MD;  Location: [facility];  Service: Cardiovascular;  Laterality: N/A;  . PARTIAL MASTECTOMY WITH NEEDLE LOCALIZATION AND AXILLARY SENTINEL LYMPH NODE BX Right 09/18/2018   Procedure: RIGHT PARTIAL MASTECTOMY AFTER NEEDLE LOCALIZATION, SENTINEL LYMPH NODE BIOPSY RIGHT AXILLA;  Surgeon: Aviva Signs, MD;  Location: [facility];  Service: General;  Laterality: Right;      Current Outpatient Medications:  .  amLODipine (NORVASC) 10 MG tablet, Take 2 tablets (20 mg total) by mouth at bedtime., Disp: 60 tablet, Rfl: 3 .  anastrozole (ARIMIDEX) 1 MG tablet, TAKE 1 TABLET(1 MG) BY MOUTH DAILY, Disp: 30 tablet, Rfl: 4 .  aspirin EC 81 MG tablet, Take 81 mg by mouth daily., Disp: , Rfl:  .  atorvastatin (LIPITOR) 20 MG tablet, TAKE 1 TABLET DAILY, Disp: 90 tablet, Rfl: 3 .  B Complex-C-Folic Acid (DIALYVITE 841) 0.8 MG TABS, Take 800 mg by mouth daily. , Disp: , Rfl:  .  cinacalcet (SENSIPAR) 30 MG tablet, Take 2 tablets (60 mg total) by mouth daily. (Patient taking differently: Take 30 mg by mouth daily. ), Disp: 90 tablet, Rfl: 0 .  DULoxetine (CYMBALTA) 30 MG capsule, Take 30 mg by mouth daily. , Disp: , Rfl:  .  gabapentin (NEURONTIN) 300 MG capsule, TAKE 1 CAPSULE(300 MG) BY MOUTH AT BEDTIME, Disp: 90 capsule, Rfl: 3 .  lanthanum (FOSRENOL) 1000 MG chewable tablet, Chew 1,000 mg by mouth 2 (two) times daily with a meal. , Disp: , Rfl:  .  lidocaine-prilocaine (EMLA) cream, APPLY SMALL AMOUNT TO ACCESS SITE (AVF) 1 TO 2 HOURS BEFORE DIALYSIS. COVER WITH OCCLUSIVE DRESSING (SARAN WRAP), Disp: , Rfl:  .  losartan (COZAAR) 50 MG tablet, TAKE 1 TABLET DAILY (DISCONTINUE LOSARTAN 100 MG), Disp: 90 tablet, Rfl: 3 .  nebivolol (BYSTOLIC) 10 MG tablet, Take 1 tablet (10 mg total) by mouth daily.  (Patient taking differently: Take 10 mg by mouth 2 (two) times daily. ), Disp: 90 tablet, Rfl: 0 .  oxyCODONE-acetaminophen (PERCOCET) 5-325 MG tablet, Take 1 tablet by mouth every 8 (eight) hours as needed for severe pain., Disp: 15 tablet, Rfl: 0 .  pantoprazole (PROTONIX) 40 MG tablet, TAKE 1 TABLET(40 MG) BY MOUTH DAILY, Disp: 90 tablet, Rfl: 3 .  tiZANidine (ZANAFLEX) 4 MG capsule, TAKE 1 CAPSULE(4 MG) BY MOUTH TWICE DAILY WITH A MEAL. START 1 CAPSULE AT NIGHT FOR 2 WEEKS AND THEN TWICE DAILY, Disp: 60 capsule, Rfl: 0 .  valACYclovir (VALTREX) 500 MG tablet, Take 1 tablet (500 mg total) by mouth daily. After dialysis for shingles, Disp: 7 tablet, Rfl: 0 .  nitroGLYCERIN (NITROSTAT) 0.4 MG SL tablet, Place 1 tablet (0.4 mg total) under the tongue every 5 (five) minutes as needed for chest pain. (Patient not taking: Reported on 09/22/2019), Disp: 25 tablet, Rfl: 3   Objective:  Vitals:   10/07/19 1356  BP: (!) 142/78  Pulse: 87  Temp: 97.7 F (36.5 C)  SpO2: 99%    Physical Exam Vitals and nursing note reviewed.  Constitutional:      Appearance: Normal appearance.  HENT:     Head: Normocephalic and atraumatic.  Cardiovascular:     Rate and Rhythm: Normal rate.     Pulses: Normal pulses.  Pulmonary:     Effort: Pulmonary effort is normal.  Abdominal:     General: Abdomen is flat. There is no distension.  Skin:    General: Skin is warm.  Neurological:     General: No focal deficit present.     Mental Status: She is alert and oriented to person, place, and time.  Psychiatric:        Mood and Affect: Mood normal.        Behavior: Behavior normal.        Thought Content: Thought content normal.     Assessment & Plan:  Malignant neoplasm of upper-inner quadrant of right breast in female, estrogen receptor positive ([place])  Stage 3 chronic kidney disease, unspecified whether stage 3a or 3b CKD  We had a detailed conversation about the patient's options for breast  reconstruction. Several reconstruction options were explained to the patient.  It is important to remember that breast reconstruction is an optional procedure. Reconstruction often requires several stages of surgery and this means more than one operation.  The surgeries are often done several months apart.  The entire process from start to finish can take a year or more.  We discussed the available methods of breast reconstruction and included:  1. Tissue expander with Acellular dermal matrix followed by implant based reconstruction. This can be done as one surgery or multiple surgeries.  2. Autologous reconstruction which is not a great option due to her DM and dialysis  For each of the method discussed the risks, benefits, scars and recovery time were discussed in detail. Specific risks included bleeding, infection, hematoma, seroma, scarring, pain, wound healing complications, flap loss, need for implant removal, donor site complications and need for dressing changes.   After the options were discussed we focused on the patient's desires and the procedure that was best for her based on all the information.  A total of 50 minutes of face-to-face time was spent in this encounter, of which >50% was spent in counseling.    Due to her multiple medical conditions the patient is likely going to decide on no reconstruction.  I gave her the information for [facility] so she can have prosthetics made for her bra.  She seemed to be excited about that.  The patient is at this point in time, desiring bilateral mastectomies.  I also spoke with Dr. Arnoldo Morale and let him know about our conversation.   Pictures were obtained of the patient and placed in the chart with the patient's or guardian's permission.   Wallace Going, DO   The 21st Century Cures Act was signed into law in 2016 which includes the topic of electronic health records.  This provides immediate access to information in MyChart.  This  includes consultation notes, operative notes, office notes, lab results and pathology reports.  If you have any questions about what you read please let us know at your next visit or call us at the office.  We are right here with you.

## 2019-10-09 DIAGNOSIS — E1129 Type 2 diabetes mellitus with other diabetic kidney complication: Secondary | ICD-10-CM | POA: Diagnosis not present

## 2019-10-09 DIAGNOSIS — D631 Anemia in chronic kidney disease: Secondary | ICD-10-CM | POA: Diagnosis not present

## 2019-10-09 DIAGNOSIS — N2581 Secondary hyperparathyroidism of renal origin: Secondary | ICD-10-CM | POA: Diagnosis not present

## 2019-10-09 DIAGNOSIS — N186 End stage renal disease: Secondary | ICD-10-CM | POA: Diagnosis not present

## 2019-10-09 DIAGNOSIS — Z992 Dependence on renal dialysis: Secondary | ICD-10-CM | POA: Diagnosis not present

## 2019-10-09 DIAGNOSIS — E119 Type 2 diabetes mellitus without complications: Secondary | ICD-10-CM | POA: Diagnosis not present

## 2019-10-11 DIAGNOSIS — N2581 Secondary hyperparathyroidism of renal origin: Secondary | ICD-10-CM | POA: Diagnosis not present

## 2019-10-11 DIAGNOSIS — E1129 Type 2 diabetes mellitus with other diabetic kidney complication: Secondary | ICD-10-CM | POA: Diagnosis not present

## 2019-10-11 DIAGNOSIS — Z992 Dependence on renal dialysis: Secondary | ICD-10-CM | POA: Diagnosis not present

## 2019-10-11 DIAGNOSIS — D631 Anemia in chronic kidney disease: Secondary | ICD-10-CM | POA: Diagnosis not present

## 2019-10-11 DIAGNOSIS — N186 End stage renal disease: Secondary | ICD-10-CM | POA: Diagnosis not present

## 2019-10-11 DIAGNOSIS — E119 Type 2 diabetes mellitus without complications: Secondary | ICD-10-CM | POA: Diagnosis not present

## 2019-10-13 DIAGNOSIS — N186 End stage renal disease: Secondary | ICD-10-CM | POA: Diagnosis not present

## 2019-10-13 DIAGNOSIS — Z992 Dependence on renal dialysis: Secondary | ICD-10-CM | POA: Diagnosis not present

## 2019-10-13 DIAGNOSIS — N040 Nephrotic syndrome with minor glomerular abnormality: Secondary | ICD-10-CM | POA: Diagnosis not present

## 2019-10-14 DIAGNOSIS — D631 Anemia in chronic kidney disease: Secondary | ICD-10-CM | POA: Diagnosis not present

## 2019-10-14 DIAGNOSIS — Z23 Encounter for immunization: Secondary | ICD-10-CM | POA: Diagnosis not present

## 2019-10-14 DIAGNOSIS — N2581 Secondary hyperparathyroidism of renal origin: Secondary | ICD-10-CM | POA: Diagnosis not present

## 2019-10-14 DIAGNOSIS — N186 End stage renal disease: Secondary | ICD-10-CM | POA: Diagnosis not present

## 2019-10-14 DIAGNOSIS — E119 Type 2 diabetes mellitus without complications: Secondary | ICD-10-CM | POA: Diagnosis not present

## 2019-10-14 DIAGNOSIS — E1129 Type 2 diabetes mellitus with other diabetic kidney complication: Secondary | ICD-10-CM | POA: Diagnosis not present

## 2019-10-14 DIAGNOSIS — Z992 Dependence on renal dialysis: Secondary | ICD-10-CM | POA: Diagnosis not present

## 2019-10-15 ENCOUNTER — Telehealth: Payer: Self-pay | Admitting: *Deleted

## 2019-10-15 ENCOUNTER — Telehealth: Payer: Self-pay | Admitting: Family Medicine

## 2019-10-15 MED ORDER — NEBIVOLOL HCL 10 MG PO TABS: 10.0000 mg | ORAL_TABLET | Freq: Every day | ORAL | 3 refills | Status: DC

## 2019-10-15 NOTE — Telephone Encounter (Addendum)
Received Standard Written Order via of fax from [facility].  Requesting signature and to be faxed back.  Given to provider to complete.   Standard Written Order completed and faxed to [facility].  Confirmation received and copy scanned into the chart.//AB/CMA

## 2019-10-15 NOTE — Telephone Encounter (Signed)
Medication called/sent to requested pharmacy  

## 2019-10-15 NOTE — Telephone Encounter (Signed)
Patient needs refill on her bystolic  [facility]

## 2019-10-16 ENCOUNTER — Ambulatory Visit (INDEPENDENT_AMBULATORY_CARE_PROVIDER_SITE_OTHER): Payer: Medicare Other | Admitting: General Surgery

## 2019-10-16 ENCOUNTER — Encounter: Payer: Self-pay | Admitting: General Surgery

## 2019-10-16 ENCOUNTER — Other Ambulatory Visit: Payer: Self-pay

## 2019-10-16 VITALS — BP 188/72 | HR 80 | Resp 12 | Ht 62.0 in | Wt 169.0 lb

## 2019-10-16 DIAGNOSIS — C50211 Malignant neoplasm of upper-inner quadrant of right female breast: Secondary | ICD-10-CM | POA: Diagnosis not present

## 2019-10-16 DIAGNOSIS — E119 Type 2 diabetes mellitus without complications: Secondary | ICD-10-CM | POA: Diagnosis not present

## 2019-10-16 DIAGNOSIS — Z992 Dependence on renal dialysis: Secondary | ICD-10-CM | POA: Diagnosis not present

## 2019-10-16 DIAGNOSIS — D631 Anemia in chronic kidney disease: Secondary | ICD-10-CM | POA: Diagnosis not present

## 2019-10-16 DIAGNOSIS — N186 End stage renal disease: Secondary | ICD-10-CM | POA: Diagnosis not present

## 2019-10-16 DIAGNOSIS — Z170 Estrogen receptor positive status [ER+]: Secondary | ICD-10-CM | POA: Diagnosis not present

## 2019-10-16 DIAGNOSIS — E1129 Type 2 diabetes mellitus with other diabetic kidney complication: Secondary | ICD-10-CM | POA: Diagnosis not present

## 2019-10-16 DIAGNOSIS — N2581 Secondary hyperparathyroidism of renal origin: Secondary | ICD-10-CM | POA: Diagnosis not present

## 2019-10-17 NOTE — Progress Notes (Signed)
Subjective:     Megan [name]  Patient returns to discuss mastectomy.  She did see Dr. Marla Roe of Plastic surgery and was not felt to be a good candidate for immediate reconstructive surgery.  She appears very tearful and is now requesting bilateral mastectomies.  She has a known recurrent breast cancer in the right breast. Objective:    BP (!) 188/72   Pulse 80   Resp 12   Ht 5\' 2"  (1.575 m)   Wt 169 lb (76.7 kg)   SpO2 98%   BMI 30.91 kg/m   General:  alert, cooperative and fatigued     Reviewed Dr. Eusebio Friendly note  Assessment:    Recurrent right breast cancer    Plan:   I had a long talk with the patient.  She states that she is tired of having multiple surgeries and just wants to get rid of the left breast so that she does not have to undergo additional breast surgery in the future.  She has not undergone genetic testing and I told her that I am willing to abide by her wishes, but she needs to think about this so that it was not just an emotional decision.  She will return in 1 week to discuss her options.  All questions were answered.

## 2019-10-18 DIAGNOSIS — N2581 Secondary hyperparathyroidism of renal origin: Secondary | ICD-10-CM | POA: Diagnosis not present

## 2019-10-18 DIAGNOSIS — N186 End stage renal disease: Secondary | ICD-10-CM | POA: Diagnosis not present

## 2019-10-18 DIAGNOSIS — Z992 Dependence on renal dialysis: Secondary | ICD-10-CM | POA: Diagnosis not present

## 2019-10-18 DIAGNOSIS — E119 Type 2 diabetes mellitus without complications: Secondary | ICD-10-CM | POA: Diagnosis not present

## 2019-10-18 DIAGNOSIS — D631 Anemia in chronic kidney disease: Secondary | ICD-10-CM | POA: Diagnosis not present

## 2019-10-18 DIAGNOSIS — E1129 Type 2 diabetes mellitus with other diabetic kidney complication: Secondary | ICD-10-CM | POA: Diagnosis not present

## 2019-10-20 ENCOUNTER — Other Ambulatory Visit: Payer: Self-pay | Admitting: Family Medicine

## 2019-10-21 DIAGNOSIS — N2581 Secondary hyperparathyroidism of renal origin: Secondary | ICD-10-CM | POA: Diagnosis not present

## 2019-10-21 DIAGNOSIS — Z992 Dependence on renal dialysis: Secondary | ICD-10-CM | POA: Diagnosis not present

## 2019-10-21 DIAGNOSIS — N186 End stage renal disease: Secondary | ICD-10-CM | POA: Diagnosis not present

## 2019-10-21 DIAGNOSIS — D631 Anemia in chronic kidney disease: Secondary | ICD-10-CM | POA: Diagnosis not present

## 2019-10-21 DIAGNOSIS — E119 Type 2 diabetes mellitus without complications: Secondary | ICD-10-CM | POA: Diagnosis not present

## 2019-10-21 DIAGNOSIS — E1129 Type 2 diabetes mellitus with other diabetic kidney complication: Secondary | ICD-10-CM | POA: Diagnosis not present

## 2019-10-23 DIAGNOSIS — E1129 Type 2 diabetes mellitus with other diabetic kidney complication: Secondary | ICD-10-CM | POA: Diagnosis not present

## 2019-10-23 DIAGNOSIS — E119 Type 2 diabetes mellitus without complications: Secondary | ICD-10-CM | POA: Diagnosis not present

## 2019-10-23 DIAGNOSIS — N2581 Secondary hyperparathyroidism of renal origin: Secondary | ICD-10-CM | POA: Diagnosis not present

## 2019-10-23 DIAGNOSIS — N186 End stage renal disease: Secondary | ICD-10-CM | POA: Diagnosis not present

## 2019-10-23 DIAGNOSIS — Z992 Dependence on renal dialysis: Secondary | ICD-10-CM | POA: Diagnosis not present

## 2019-10-23 DIAGNOSIS — D631 Anemia in chronic kidney disease: Secondary | ICD-10-CM | POA: Diagnosis not present

## 2019-10-25 DIAGNOSIS — Z992 Dependence on renal dialysis: Secondary | ICD-10-CM | POA: Diagnosis not present

## 2019-10-25 DIAGNOSIS — D631 Anemia in chronic kidney disease: Secondary | ICD-10-CM | POA: Diagnosis not present

## 2019-10-25 DIAGNOSIS — N186 End stage renal disease: Secondary | ICD-10-CM | POA: Diagnosis not present

## 2019-10-25 DIAGNOSIS — N2581 Secondary hyperparathyroidism of renal origin: Secondary | ICD-10-CM | POA: Diagnosis not present

## 2019-10-25 DIAGNOSIS — E119 Type 2 diabetes mellitus without complications: Secondary | ICD-10-CM | POA: Diagnosis not present

## 2019-10-25 DIAGNOSIS — E1129 Type 2 diabetes mellitus with other diabetic kidney complication: Secondary | ICD-10-CM | POA: Diagnosis not present

## 2019-10-28 DIAGNOSIS — Z992 Dependence on renal dialysis: Secondary | ICD-10-CM | POA: Diagnosis not present

## 2019-10-28 DIAGNOSIS — E1129 Type 2 diabetes mellitus with other diabetic kidney complication: Secondary | ICD-10-CM | POA: Diagnosis not present

## 2019-10-28 DIAGNOSIS — N2581 Secondary hyperparathyroidism of renal origin: Secondary | ICD-10-CM | POA: Diagnosis not present

## 2019-10-28 DIAGNOSIS — N186 End stage renal disease: Secondary | ICD-10-CM | POA: Diagnosis not present

## 2019-10-28 DIAGNOSIS — E119 Type 2 diabetes mellitus without complications: Secondary | ICD-10-CM | POA: Diagnosis not present

## 2019-10-28 DIAGNOSIS — D631 Anemia in chronic kidney disease: Secondary | ICD-10-CM | POA: Diagnosis not present

## 2019-10-30 DIAGNOSIS — E119 Type 2 diabetes mellitus without complications: Secondary | ICD-10-CM | POA: Diagnosis not present

## 2019-10-30 DIAGNOSIS — E1129 Type 2 diabetes mellitus with other diabetic kidney complication: Secondary | ICD-10-CM | POA: Diagnosis not present

## 2019-10-30 DIAGNOSIS — N2581 Secondary hyperparathyroidism of renal origin: Secondary | ICD-10-CM | POA: Diagnosis not present

## 2019-10-30 DIAGNOSIS — N186 End stage renal disease: Secondary | ICD-10-CM | POA: Diagnosis not present

## 2019-10-30 DIAGNOSIS — D631 Anemia in chronic kidney disease: Secondary | ICD-10-CM | POA: Diagnosis not present

## 2019-10-30 DIAGNOSIS — Z992 Dependence on renal dialysis: Secondary | ICD-10-CM | POA: Diagnosis not present

## 2019-10-31 DIAGNOSIS — E119 Type 2 diabetes mellitus without complications: Secondary | ICD-10-CM | POA: Diagnosis not present

## 2019-10-31 DIAGNOSIS — N186 End stage renal disease: Secondary | ICD-10-CM | POA: Diagnosis not present

## 2019-10-31 DIAGNOSIS — E1129 Type 2 diabetes mellitus with other diabetic kidney complication: Secondary | ICD-10-CM | POA: Diagnosis not present

## 2019-10-31 DIAGNOSIS — D631 Anemia in chronic kidney disease: Secondary | ICD-10-CM | POA: Diagnosis not present

## 2019-10-31 DIAGNOSIS — N2581 Secondary hyperparathyroidism of renal origin: Secondary | ICD-10-CM | POA: Diagnosis not present

## 2019-10-31 DIAGNOSIS — Z992 Dependence on renal dialysis: Secondary | ICD-10-CM | POA: Diagnosis not present

## 2019-11-04 DIAGNOSIS — E1129 Type 2 diabetes mellitus with other diabetic kidney complication: Secondary | ICD-10-CM | POA: Diagnosis not present

## 2019-11-04 DIAGNOSIS — N2581 Secondary hyperparathyroidism of renal origin: Secondary | ICD-10-CM | POA: Diagnosis not present

## 2019-11-04 DIAGNOSIS — N186 End stage renal disease: Secondary | ICD-10-CM | POA: Diagnosis not present

## 2019-11-04 DIAGNOSIS — Z992 Dependence on renal dialysis: Secondary | ICD-10-CM | POA: Diagnosis not present

## 2019-11-04 DIAGNOSIS — E119 Type 2 diabetes mellitus without complications: Secondary | ICD-10-CM | POA: Diagnosis not present

## 2019-11-04 DIAGNOSIS — D631 Anemia in chronic kidney disease: Secondary | ICD-10-CM | POA: Diagnosis not present

## 2019-11-06 ENCOUNTER — Encounter: Payer: Self-pay | Admitting: General Surgery

## 2019-11-06 ENCOUNTER — Ambulatory Visit (INDEPENDENT_AMBULATORY_CARE_PROVIDER_SITE_OTHER): Payer: Medicare Other | Admitting: General Surgery

## 2019-11-06 ENCOUNTER — Other Ambulatory Visit: Payer: Self-pay

## 2019-11-06 VITALS — BP 106/70 | HR 76 | Temp 97.6°F | Resp 14 | Ht 62.0 in | Wt 173.8 lb

## 2019-11-06 DIAGNOSIS — N2581 Secondary hyperparathyroidism of renal origin: Secondary | ICD-10-CM | POA: Diagnosis not present

## 2019-11-06 DIAGNOSIS — E119 Type 2 diabetes mellitus without complications: Secondary | ICD-10-CM | POA: Diagnosis not present

## 2019-11-06 DIAGNOSIS — C50211 Malignant neoplasm of upper-inner quadrant of right female breast: Secondary | ICD-10-CM | POA: Diagnosis not present

## 2019-11-06 DIAGNOSIS — Z992 Dependence on renal dialysis: Secondary | ICD-10-CM | POA: Diagnosis not present

## 2019-11-06 DIAGNOSIS — N186 End stage renal disease: Secondary | ICD-10-CM | POA: Diagnosis not present

## 2019-11-06 DIAGNOSIS — D631 Anemia in chronic kidney disease: Secondary | ICD-10-CM | POA: Diagnosis not present

## 2019-11-06 DIAGNOSIS — E1129 Type 2 diabetes mellitus with other diabetic kidney complication: Secondary | ICD-10-CM | POA: Diagnosis not present

## 2019-11-06 NOTE — Progress Notes (Signed)
Subjective:     Megan [name]  Patient here for follow-up concerning surgical treatment for her recurrent right breast cancer. Objective:    BP 106/70   Pulse 76   Temp 97.6 F (36.4 C) (Oral)   Resp 14   Ht 5\' 2"  (1.575 m)   Wt 173 lb 12.8 oz (78.8 kg)   SpO2 98%   BMI 31.79 kg/m   General:  alert, cooperative and no distress       Assessment:    Recurrent right breast cancer    Plan:   Patient has elected to proceed with a right modified radical mastectomy.  She would like to delay surgery until after her grand baby is born in early May.  That is fine with me.  She will call to schedule the surgery.  She undergoes dialysis on Tuesdays, Thursdays, and Saturdays.

## 2019-11-06 NOTE — Patient Instructions (Signed)
Total or Modified Radical Mastectomy A total mastectomy and a modified radical mastectomy are surgeries that are done as part of treatment for breast cancer. You will have one of those types of surgery. Both types involve removing a breast.  In a total mastectomy (simple mastectomy), all breast tissue including the nipple will be removed.  In a modified radical mastectomy, lymph nodes under the arm will be removed along with the breast and nipple. Some of the lining over the muscle tissues under the breast may also be removed. These procedures may also be used to help prevent breast cancer. A preventive (prophylactic) mastectomy may be done if you are at an increased risk of breast cancer due to harmful changes (mutations) in certain genes (BRCA genes). In that case, the procedure involves removing both of your breasts. This can reduce your risk of developing breast cancer in the future. For a transgender person, a total mastectomy may be done as part of a surgical transition from female to female. Let your health care provider know about:  Any allergies you have.  All medicines you are taking, including vitamins, herbs, eye drops, creams, and over-the-counter medicines.  Any problems you or family members have had with anesthetic medicines.  Any blood disorders you have.  Any surgeries you have had.  Any medical conditions you have.  Whether you are pregnant or may be pregnant. What are the risks? Generally, this is a safe procedure. However, problems may occur, including:  Pain.  Infection.  Bleeding.  Allergic reactions to medicines.  Scar tissue.  Chest numbness on the side of the surgery.  Fluid buildup under the skin flaps where your breast was removed (seroma).  Sensation of throbbing or tingling.  Stress or sadness from losing your breast. If you have the lymph nodes under your arm removed, you may have arm swelling, weakness, or numbness on the same side of your body  as your surgery. What happens before the procedure? Staying hydrated Follow instructions from your health care provider about hydration, which may include:  Up to 2 hours before the procedure - you may continue to drink clear liquids, such as water, clear fruit juice, black coffee, and plain tea. Eating and drinking restrictions Follow instructions from your health care provider about eating and drinking, which may include:  8 hours before the procedure - stop eating heavy meals or foods such as meat, fried foods, or fatty foods.  6 hours before the procedure - stop eating light meals or foods, such as toast or cereal.  6 hours before the procedure - stop drinking milk or drinks that contain milk.  2 hours before the procedure - stop drinking clear liquids. Medicines  Ask your health care provider about: ? Changing or stopping your regular medicines. This is especially important if you are taking diabetes medicines or blood thinners. ? Taking medicines such as aspirin and ibuprofen. These medicines can thin your blood. Do not take these medicines unless your health care provider tells you to take them. ? Taking over-the-counter medicines, vitamins, herbs, and supplements.  Your health care team may give you antibiotic medicine to help prevent infection. General instructions  You may be checked for extra fluid around your lymph nodes (lymphedema).  Plan to have someone take you home from the hospital or clinic.  Plan to have a responsible adult care for you for at least 24 hours after you leave the hospital or clinic. This is important.  Ask your health care provider how   your surgical site will be marked or identified.  You may be asked to shower with a germ-killing soap. What happens during the procedure?   To lower your risk of infection: ? Your health care team will wash or sanitize their hands. ? Your skin will be washed with soap.  An IV will be inserted into one of your  veins.  You will be given a medicine to make you fall asleep (general anesthetic).  A wide incision will be made around your nipple. The skin and nipple inside the incision will be removed along with all breast tissue.  If you are having a modified radical mastectomy: ? The lining over your chest muscles will be removed. ? The incision may be extended to reach the lymph nodes under your arm, or a second incision may be made. ? Lymph nodes will be removed.  Breast tissue and lymph nodes that are removed will be sent to the lab for testing.  You may have a drainage tube inserted into your incision to collect fluid that builds up after surgery. This tube will be connected to a suction bulb on the outside of your body to remove the fluid.  Your incision or incisions will be closed with stitches (sutures).  A bandage (dressing) will be placed over your breast area. If lymph nodes were removed, a dressing will also be placed under your arm. The procedure may vary among health care providers and hospitals. What happens after the procedure?  Your blood pressure, heart rate, breathing rate, and blood oxygen level will be monitored until the medicines you were given have worn off.  You will be given pain medicine as needed.  You will be encouraged to get up and walk as soon as you can.  Your IV can be removed when you are able to eat and drink.  You may have a drainage tube in place for 2-3 days to prevent a collection of blood (hematoma) from developing in the breast area. You will be given instructions about caring for the drain before you go home.  A pressure bandage may be applied for 1-2 days to prevent bleeding or swelling. Ask your health care provider how to care for your pressure bandage at home. Summary  In a total mastectomy (simple mastectomy), all breast tissue including the nipple will be removed. In a modified radical mastectomy, the lymph nodes under the arm will be removed  along with the breast and nipple.  Before the procedure, follow instructions from your health care provider about eating and drinking, and ask about changing or stopping your regular medicines.  You will be given a medicine to make you fall asleep (general anesthetic) during the procedure. This information is not intended to replace advice given to you by your health care provider. Make sure you discuss any questions you have with your health care provider. Document Revised: 10/04/2018 Document Reviewed: 05/04/2017 Elsevier Patient Education  2020 [employer].

## 2019-11-08 DIAGNOSIS — E1129 Type 2 diabetes mellitus with other diabetic kidney complication: Secondary | ICD-10-CM | POA: Diagnosis not present

## 2019-11-08 DIAGNOSIS — E119 Type 2 diabetes mellitus without complications: Secondary | ICD-10-CM | POA: Diagnosis not present

## 2019-11-08 DIAGNOSIS — N2581 Secondary hyperparathyroidism of renal origin: Secondary | ICD-10-CM | POA: Diagnosis not present

## 2019-11-08 DIAGNOSIS — N186 End stage renal disease: Secondary | ICD-10-CM | POA: Diagnosis not present

## 2019-11-08 DIAGNOSIS — Z992 Dependence on renal dialysis: Secondary | ICD-10-CM | POA: Diagnosis not present

## 2019-11-08 DIAGNOSIS — D631 Anemia in chronic kidney disease: Secondary | ICD-10-CM | POA: Diagnosis not present

## 2019-11-10 DIAGNOSIS — E119 Type 2 diabetes mellitus without complications: Secondary | ICD-10-CM | POA: Diagnosis not present

## 2019-11-10 DIAGNOSIS — N2581 Secondary hyperparathyroidism of renal origin: Secondary | ICD-10-CM | POA: Diagnosis not present

## 2019-11-10 DIAGNOSIS — D631 Anemia in chronic kidney disease: Secondary | ICD-10-CM | POA: Diagnosis not present

## 2019-11-10 DIAGNOSIS — N186 End stage renal disease: Secondary | ICD-10-CM | POA: Diagnosis not present

## 2019-11-10 DIAGNOSIS — Z992 Dependence on renal dialysis: Secondary | ICD-10-CM | POA: Diagnosis not present

## 2019-11-10 DIAGNOSIS — E1129 Type 2 diabetes mellitus with other diabetic kidney complication: Secondary | ICD-10-CM | POA: Diagnosis not present

## 2019-11-11 ENCOUNTER — Ambulatory Visit: Payer: Medicare Other | Attending: Internal Medicine

## 2019-11-11 DIAGNOSIS — Z23 Encounter for immunization: Secondary | ICD-10-CM

## 2019-11-11 NOTE — Progress Notes (Signed)
   Covid-19 Vaccination Clinic  Name:  Megan [name]    MRN: [id number] DOB: 1961-03-02  11/11/2019  [name] was observed post Covid-19 immunization for 15 minutes without incident. She was provided with Vaccine Information Sheet and instruction to access the V-Safe system.   [name] was instructed to call 911 with any severe reactions post vaccine: Marland Kitchen Difficulty breathing  . Swelling of face and throat  . A fast heartbeat  . A bad rash all over body  . Dizziness and weakness   Immunizations Administered    Name Date Dose VIS Date Route   Moderna COVID-19 Vaccine 11/11/2019  2:48 PM 0.5 mL 07/15/2019 Intramuscular   Manufacturer: Moderna   Lot: [id number]   [place]: [id number]

## 2019-11-12 DIAGNOSIS — N186 End stage renal disease: Secondary | ICD-10-CM | POA: Diagnosis not present

## 2019-11-12 DIAGNOSIS — E1129 Type 2 diabetes mellitus with other diabetic kidney complication: Secondary | ICD-10-CM | POA: Diagnosis not present

## 2019-11-12 DIAGNOSIS — N2581 Secondary hyperparathyroidism of renal origin: Secondary | ICD-10-CM | POA: Diagnosis not present

## 2019-11-12 DIAGNOSIS — D631 Anemia in chronic kidney disease: Secondary | ICD-10-CM | POA: Diagnosis not present

## 2019-11-12 DIAGNOSIS — E119 Type 2 diabetes mellitus without complications: Secondary | ICD-10-CM | POA: Diagnosis not present

## 2019-11-12 DIAGNOSIS — Z992 Dependence on renal dialysis: Secondary | ICD-10-CM | POA: Diagnosis not present

## 2019-11-13 DIAGNOSIS — Z992 Dependence on renal dialysis: Secondary | ICD-10-CM | POA: Diagnosis not present

## 2019-11-13 DIAGNOSIS — N040 Nephrotic syndrome with minor glomerular abnormality: Secondary | ICD-10-CM | POA: Diagnosis not present

## 2019-11-13 DIAGNOSIS — N186 End stage renal disease: Secondary | ICD-10-CM | POA: Diagnosis not present

## 2019-11-14 DIAGNOSIS — D631 Anemia in chronic kidney disease: Secondary | ICD-10-CM | POA: Diagnosis not present

## 2019-11-14 DIAGNOSIS — E1129 Type 2 diabetes mellitus with other diabetic kidney complication: Secondary | ICD-10-CM | POA: Diagnosis not present

## 2019-11-14 DIAGNOSIS — Z992 Dependence on renal dialysis: Secondary | ICD-10-CM | POA: Diagnosis not present

## 2019-11-14 DIAGNOSIS — N186 End stage renal disease: Secondary | ICD-10-CM | POA: Diagnosis not present

## 2019-11-14 DIAGNOSIS — E119 Type 2 diabetes mellitus without complications: Secondary | ICD-10-CM | POA: Diagnosis not present

## 2019-11-14 DIAGNOSIS — N2581 Secondary hyperparathyroidism of renal origin: Secondary | ICD-10-CM | POA: Diagnosis not present

## 2019-11-17 ENCOUNTER — Inpatient Hospital Stay (HOSPITAL_COMMUNITY): Payer: Medicare Other

## 2019-11-17 DIAGNOSIS — N2581 Secondary hyperparathyroidism of renal origin: Secondary | ICD-10-CM | POA: Diagnosis not present

## 2019-11-17 DIAGNOSIS — E1129 Type 2 diabetes mellitus with other diabetic kidney complication: Secondary | ICD-10-CM | POA: Diagnosis not present

## 2019-11-17 DIAGNOSIS — N186 End stage renal disease: Secondary | ICD-10-CM | POA: Diagnosis not present

## 2019-11-17 DIAGNOSIS — E119 Type 2 diabetes mellitus without complications: Secondary | ICD-10-CM | POA: Diagnosis not present

## 2019-11-17 DIAGNOSIS — Z992 Dependence on renal dialysis: Secondary | ICD-10-CM | POA: Diagnosis not present

## 2019-11-17 DIAGNOSIS — D631 Anemia in chronic kidney disease: Secondary | ICD-10-CM | POA: Diagnosis not present

## 2019-11-19 DIAGNOSIS — N2581 Secondary hyperparathyroidism of renal origin: Secondary | ICD-10-CM | POA: Diagnosis not present

## 2019-11-19 DIAGNOSIS — E119 Type 2 diabetes mellitus without complications: Secondary | ICD-10-CM | POA: Diagnosis not present

## 2019-11-19 DIAGNOSIS — E1129 Type 2 diabetes mellitus with other diabetic kidney complication: Secondary | ICD-10-CM | POA: Diagnosis not present

## 2019-11-19 DIAGNOSIS — N186 End stage renal disease: Secondary | ICD-10-CM | POA: Diagnosis not present

## 2019-11-19 DIAGNOSIS — Z992 Dependence on renal dialysis: Secondary | ICD-10-CM | POA: Diagnosis not present

## 2019-11-19 DIAGNOSIS — D631 Anemia in chronic kidney disease: Secondary | ICD-10-CM | POA: Diagnosis not present

## 2019-11-21 DIAGNOSIS — N2581 Secondary hyperparathyroidism of renal origin: Secondary | ICD-10-CM | POA: Diagnosis not present

## 2019-11-21 DIAGNOSIS — N186 End stage renal disease: Secondary | ICD-10-CM | POA: Diagnosis not present

## 2019-11-21 DIAGNOSIS — Z992 Dependence on renal dialysis: Secondary | ICD-10-CM | POA: Diagnosis not present

## 2019-11-21 DIAGNOSIS — D631 Anemia in chronic kidney disease: Secondary | ICD-10-CM | POA: Diagnosis not present

## 2019-11-21 DIAGNOSIS — E119 Type 2 diabetes mellitus without complications: Secondary | ICD-10-CM | POA: Diagnosis not present

## 2019-11-21 DIAGNOSIS — E1129 Type 2 diabetes mellitus with other diabetic kidney complication: Secondary | ICD-10-CM | POA: Diagnosis not present

## 2019-11-23 ENCOUNTER — Other Ambulatory Visit (HOSPITAL_COMMUNITY): Payer: Self-pay | Admitting: Nurse Practitioner

## 2019-11-23 DIAGNOSIS — Z170 Estrogen receptor positive status [ER+]: Secondary | ICD-10-CM

## 2019-11-23 DIAGNOSIS — C50211 Malignant neoplasm of upper-inner quadrant of right female breast: Secondary | ICD-10-CM

## 2019-11-24 ENCOUNTER — Ambulatory Visit (HOSPITAL_COMMUNITY): Payer: Medicare Other | Admitting: Hematology

## 2019-11-24 DIAGNOSIS — N186 End stage renal disease: Secondary | ICD-10-CM | POA: Diagnosis not present

## 2019-11-24 DIAGNOSIS — D631 Anemia in chronic kidney disease: Secondary | ICD-10-CM | POA: Diagnosis not present

## 2019-11-24 DIAGNOSIS — E119 Type 2 diabetes mellitus without complications: Secondary | ICD-10-CM | POA: Diagnosis not present

## 2019-11-24 DIAGNOSIS — E1129 Type 2 diabetes mellitus with other diabetic kidney complication: Secondary | ICD-10-CM | POA: Diagnosis not present

## 2019-11-24 DIAGNOSIS — N2581 Secondary hyperparathyroidism of renal origin: Secondary | ICD-10-CM | POA: Diagnosis not present

## 2019-11-24 DIAGNOSIS — Z992 Dependence on renal dialysis: Secondary | ICD-10-CM | POA: Diagnosis not present

## 2019-11-26 DIAGNOSIS — N186 End stage renal disease: Secondary | ICD-10-CM | POA: Diagnosis not present

## 2019-11-26 DIAGNOSIS — E119 Type 2 diabetes mellitus without complications: Secondary | ICD-10-CM | POA: Diagnosis not present

## 2019-11-26 DIAGNOSIS — E1129 Type 2 diabetes mellitus with other diabetic kidney complication: Secondary | ICD-10-CM | POA: Diagnosis not present

## 2019-11-26 DIAGNOSIS — Z992 Dependence on renal dialysis: Secondary | ICD-10-CM | POA: Diagnosis not present

## 2019-11-26 DIAGNOSIS — N2581 Secondary hyperparathyroidism of renal origin: Secondary | ICD-10-CM | POA: Diagnosis not present

## 2019-11-26 DIAGNOSIS — D631 Anemia in chronic kidney disease: Secondary | ICD-10-CM | POA: Diagnosis not present

## 2019-11-28 DIAGNOSIS — N186 End stage renal disease: Secondary | ICD-10-CM | POA: Diagnosis not present

## 2019-11-28 DIAGNOSIS — E119 Type 2 diabetes mellitus without complications: Secondary | ICD-10-CM | POA: Diagnosis not present

## 2019-11-28 DIAGNOSIS — E1129 Type 2 diabetes mellitus with other diabetic kidney complication: Secondary | ICD-10-CM | POA: Diagnosis not present

## 2019-11-28 DIAGNOSIS — D631 Anemia in chronic kidney disease: Secondary | ICD-10-CM | POA: Diagnosis not present

## 2019-11-28 DIAGNOSIS — Z992 Dependence on renal dialysis: Secondary | ICD-10-CM | POA: Diagnosis not present

## 2019-11-28 DIAGNOSIS — N2581 Secondary hyperparathyroidism of renal origin: Secondary | ICD-10-CM | POA: Diagnosis not present

## 2019-12-01 DIAGNOSIS — D631 Anemia in chronic kidney disease: Secondary | ICD-10-CM | POA: Diagnosis not present

## 2019-12-01 DIAGNOSIS — N186 End stage renal disease: Secondary | ICD-10-CM | POA: Diagnosis not present

## 2019-12-01 DIAGNOSIS — Z992 Dependence on renal dialysis: Secondary | ICD-10-CM | POA: Diagnosis not present

## 2019-12-01 DIAGNOSIS — E1129 Type 2 diabetes mellitus with other diabetic kidney complication: Secondary | ICD-10-CM | POA: Diagnosis not present

## 2019-12-01 DIAGNOSIS — N2581 Secondary hyperparathyroidism of renal origin: Secondary | ICD-10-CM | POA: Diagnosis not present

## 2019-12-01 DIAGNOSIS — E119 Type 2 diabetes mellitus without complications: Secondary | ICD-10-CM | POA: Diagnosis not present

## 2019-12-03 DIAGNOSIS — N2581 Secondary hyperparathyroidism of renal origin: Secondary | ICD-10-CM | POA: Diagnosis not present

## 2019-12-03 DIAGNOSIS — D631 Anemia in chronic kidney disease: Secondary | ICD-10-CM | POA: Diagnosis not present

## 2019-12-03 DIAGNOSIS — N186 End stage renal disease: Secondary | ICD-10-CM | POA: Diagnosis not present

## 2019-12-03 DIAGNOSIS — E039 Hypothyroidism, unspecified: Secondary | ICD-10-CM | POA: Diagnosis not present

## 2019-12-03 DIAGNOSIS — E1129 Type 2 diabetes mellitus with other diabetic kidney complication: Secondary | ICD-10-CM | POA: Diagnosis not present

## 2019-12-03 DIAGNOSIS — E119 Type 2 diabetes mellitus without complications: Secondary | ICD-10-CM | POA: Diagnosis not present

## 2019-12-03 DIAGNOSIS — Z992 Dependence on renal dialysis: Secondary | ICD-10-CM | POA: Diagnosis not present

## 2019-12-05 DIAGNOSIS — Z992 Dependence on renal dialysis: Secondary | ICD-10-CM | POA: Diagnosis not present

## 2019-12-05 DIAGNOSIS — N2581 Secondary hyperparathyroidism of renal origin: Secondary | ICD-10-CM | POA: Diagnosis not present

## 2019-12-05 DIAGNOSIS — E1129 Type 2 diabetes mellitus with other diabetic kidney complication: Secondary | ICD-10-CM | POA: Diagnosis not present

## 2019-12-05 DIAGNOSIS — N186 End stage renal disease: Secondary | ICD-10-CM | POA: Diagnosis not present

## 2019-12-05 DIAGNOSIS — D631 Anemia in chronic kidney disease: Secondary | ICD-10-CM | POA: Diagnosis not present

## 2019-12-05 DIAGNOSIS — E119 Type 2 diabetes mellitus without complications: Secondary | ICD-10-CM | POA: Diagnosis not present

## 2019-12-08 DIAGNOSIS — Z992 Dependence on renal dialysis: Secondary | ICD-10-CM | POA: Diagnosis not present

## 2019-12-08 DIAGNOSIS — E1129 Type 2 diabetes mellitus with other diabetic kidney complication: Secondary | ICD-10-CM | POA: Diagnosis not present

## 2019-12-08 DIAGNOSIS — E119 Type 2 diabetes mellitus without complications: Secondary | ICD-10-CM | POA: Diagnosis not present

## 2019-12-08 DIAGNOSIS — D631 Anemia in chronic kidney disease: Secondary | ICD-10-CM | POA: Diagnosis not present

## 2019-12-08 DIAGNOSIS — N186 End stage renal disease: Secondary | ICD-10-CM | POA: Diagnosis not present

## 2019-12-08 DIAGNOSIS — N2581 Secondary hyperparathyroidism of renal origin: Secondary | ICD-10-CM | POA: Diagnosis not present

## 2019-12-10 ENCOUNTER — Other Ambulatory Visit: Payer: Self-pay

## 2019-12-10 ENCOUNTER — Ambulatory Visit: Payer: Medicare Other | Attending: Internal Medicine

## 2019-12-10 DIAGNOSIS — Z992 Dependence on renal dialysis: Secondary | ICD-10-CM | POA: Diagnosis not present

## 2019-12-10 DIAGNOSIS — N186 End stage renal disease: Secondary | ICD-10-CM | POA: Diagnosis not present

## 2019-12-10 DIAGNOSIS — E1129 Type 2 diabetes mellitus with other diabetic kidney complication: Secondary | ICD-10-CM | POA: Diagnosis not present

## 2019-12-10 DIAGNOSIS — N2581 Secondary hyperparathyroidism of renal origin: Secondary | ICD-10-CM | POA: Diagnosis not present

## 2019-12-10 DIAGNOSIS — Z23 Encounter for immunization: Secondary | ICD-10-CM

## 2019-12-10 DIAGNOSIS — E119 Type 2 diabetes mellitus without complications: Secondary | ICD-10-CM | POA: Diagnosis not present

## 2019-12-10 DIAGNOSIS — D631 Anemia in chronic kidney disease: Secondary | ICD-10-CM | POA: Diagnosis not present

## 2019-12-10 NOTE — Progress Notes (Signed)
   Covid-19 Vaccination Clinic  Name:  SKI POLICH    MRN: [id number] DOB: 1961-06-23  12/10/2019  Ms. Knack was observed post Covid-19 immunization for 15 minutes without incident. She was provided with Vaccine Information Sheet and instruction to access the V-Safe system.   Ms. Manders was instructed to call 911 with any severe reactions post vaccine: Marland Kitchen Difficulty breathing  . Swelling of face and throat  . A fast heartbeat  . A bad rash all over body  . Dizziness and weakness   Immunizations Administered    Name Date Dose VIS Date Route   Moderna COVID-19 Vaccine 12/10/2019 11:36 AM 0.5 mL 07/2019 Intramuscular   Manufacturer: Moderna   Lot: [id number]   [place]: 85790-793-10

## 2019-12-12 DIAGNOSIS — Z992 Dependence on renal dialysis: Secondary | ICD-10-CM | POA: Diagnosis not present

## 2019-12-12 DIAGNOSIS — E1129 Type 2 diabetes mellitus with other diabetic kidney complication: Secondary | ICD-10-CM | POA: Diagnosis not present

## 2019-12-12 DIAGNOSIS — D631 Anemia in chronic kidney disease: Secondary | ICD-10-CM | POA: Diagnosis not present

## 2019-12-12 DIAGNOSIS — N2581 Secondary hyperparathyroidism of renal origin: Secondary | ICD-10-CM | POA: Diagnosis not present

## 2019-12-12 DIAGNOSIS — N186 End stage renal disease: Secondary | ICD-10-CM | POA: Diagnosis not present

## 2019-12-12 DIAGNOSIS — E119 Type 2 diabetes mellitus without complications: Secondary | ICD-10-CM | POA: Diagnosis not present

## 2019-12-13 DIAGNOSIS — Z992 Dependence on renal dialysis: Secondary | ICD-10-CM | POA: Diagnosis not present

## 2019-12-13 DIAGNOSIS — N040 Nephrotic syndrome with minor glomerular abnormality: Secondary | ICD-10-CM | POA: Diagnosis not present

## 2019-12-13 DIAGNOSIS — N186 End stage renal disease: Secondary | ICD-10-CM | POA: Diagnosis not present

## 2019-12-15 DIAGNOSIS — N2581 Secondary hyperparathyroidism of renal origin: Secondary | ICD-10-CM | POA: Diagnosis not present

## 2019-12-15 DIAGNOSIS — N186 End stage renal disease: Secondary | ICD-10-CM | POA: Diagnosis not present

## 2019-12-15 DIAGNOSIS — D631 Anemia in chronic kidney disease: Secondary | ICD-10-CM | POA: Diagnosis not present

## 2019-12-15 DIAGNOSIS — Z992 Dependence on renal dialysis: Secondary | ICD-10-CM | POA: Diagnosis not present

## 2019-12-15 DIAGNOSIS — E1129 Type 2 diabetes mellitus with other diabetic kidney complication: Secondary | ICD-10-CM | POA: Diagnosis not present

## 2019-12-15 DIAGNOSIS — E119 Type 2 diabetes mellitus without complications: Secondary | ICD-10-CM | POA: Diagnosis not present

## 2019-12-17 DIAGNOSIS — E119 Type 2 diabetes mellitus without complications: Secondary | ICD-10-CM | POA: Diagnosis not present

## 2019-12-17 DIAGNOSIS — Z992 Dependence on renal dialysis: Secondary | ICD-10-CM | POA: Diagnosis not present

## 2019-12-17 DIAGNOSIS — E1129 Type 2 diabetes mellitus with other diabetic kidney complication: Secondary | ICD-10-CM | POA: Diagnosis not present

## 2019-12-17 DIAGNOSIS — N186 End stage renal disease: Secondary | ICD-10-CM | POA: Diagnosis not present

## 2019-12-17 DIAGNOSIS — D631 Anemia in chronic kidney disease: Secondary | ICD-10-CM | POA: Diagnosis not present

## 2019-12-17 DIAGNOSIS — N2581 Secondary hyperparathyroidism of renal origin: Secondary | ICD-10-CM | POA: Diagnosis not present

## 2019-12-18 DIAGNOSIS — N2581 Secondary hyperparathyroidism of renal origin: Secondary | ICD-10-CM | POA: Diagnosis not present

## 2019-12-18 DIAGNOSIS — N186 End stage renal disease: Secondary | ICD-10-CM | POA: Diagnosis not present

## 2019-12-18 DIAGNOSIS — E119 Type 2 diabetes mellitus without complications: Secondary | ICD-10-CM | POA: Diagnosis not present

## 2019-12-18 DIAGNOSIS — Z992 Dependence on renal dialysis: Secondary | ICD-10-CM | POA: Diagnosis not present

## 2019-12-18 DIAGNOSIS — D631 Anemia in chronic kidney disease: Secondary | ICD-10-CM | POA: Diagnosis not present

## 2019-12-18 DIAGNOSIS — E1129 Type 2 diabetes mellitus with other diabetic kidney complication: Secondary | ICD-10-CM | POA: Diagnosis not present

## 2019-12-22 DIAGNOSIS — D631 Anemia in chronic kidney disease: Secondary | ICD-10-CM | POA: Diagnosis not present

## 2019-12-22 DIAGNOSIS — Z992 Dependence on renal dialysis: Secondary | ICD-10-CM | POA: Diagnosis not present

## 2019-12-22 DIAGNOSIS — N2581 Secondary hyperparathyroidism of renal origin: Secondary | ICD-10-CM | POA: Diagnosis not present

## 2019-12-22 DIAGNOSIS — E1129 Type 2 diabetes mellitus with other diabetic kidney complication: Secondary | ICD-10-CM | POA: Diagnosis not present

## 2019-12-22 DIAGNOSIS — N186 End stage renal disease: Secondary | ICD-10-CM | POA: Diagnosis not present

## 2019-12-22 DIAGNOSIS — E119 Type 2 diabetes mellitus without complications: Secondary | ICD-10-CM | POA: Diagnosis not present

## 2019-12-24 DIAGNOSIS — Z992 Dependence on renal dialysis: Secondary | ICD-10-CM | POA: Diagnosis not present

## 2019-12-24 DIAGNOSIS — E119 Type 2 diabetes mellitus without complications: Secondary | ICD-10-CM | POA: Diagnosis not present

## 2019-12-24 DIAGNOSIS — N186 End stage renal disease: Secondary | ICD-10-CM | POA: Diagnosis not present

## 2019-12-24 DIAGNOSIS — D631 Anemia in chronic kidney disease: Secondary | ICD-10-CM | POA: Diagnosis not present

## 2019-12-24 DIAGNOSIS — N2581 Secondary hyperparathyroidism of renal origin: Secondary | ICD-10-CM | POA: Diagnosis not present

## 2019-12-24 DIAGNOSIS — E1129 Type 2 diabetes mellitus with other diabetic kidney complication: Secondary | ICD-10-CM | POA: Diagnosis not present

## 2019-12-26 DIAGNOSIS — E1129 Type 2 diabetes mellitus with other diabetic kidney complication: Secondary | ICD-10-CM | POA: Diagnosis not present

## 2019-12-26 DIAGNOSIS — Z992 Dependence on renal dialysis: Secondary | ICD-10-CM | POA: Diagnosis not present

## 2019-12-26 DIAGNOSIS — D631 Anemia in chronic kidney disease: Secondary | ICD-10-CM | POA: Diagnosis not present

## 2019-12-26 DIAGNOSIS — N186 End stage renal disease: Secondary | ICD-10-CM | POA: Diagnosis not present

## 2019-12-26 DIAGNOSIS — E119 Type 2 diabetes mellitus without complications: Secondary | ICD-10-CM | POA: Diagnosis not present

## 2019-12-26 DIAGNOSIS — N2581 Secondary hyperparathyroidism of renal origin: Secondary | ICD-10-CM | POA: Diagnosis not present

## 2019-12-29 DIAGNOSIS — N186 End stage renal disease: Secondary | ICD-10-CM | POA: Diagnosis not present

## 2019-12-29 DIAGNOSIS — E1129 Type 2 diabetes mellitus with other diabetic kidney complication: Secondary | ICD-10-CM | POA: Diagnosis not present

## 2019-12-29 DIAGNOSIS — E119 Type 2 diabetes mellitus without complications: Secondary | ICD-10-CM | POA: Diagnosis not present

## 2019-12-29 DIAGNOSIS — D631 Anemia in chronic kidney disease: Secondary | ICD-10-CM | POA: Diagnosis not present

## 2019-12-29 DIAGNOSIS — N2581 Secondary hyperparathyroidism of renal origin: Secondary | ICD-10-CM | POA: Diagnosis not present

## 2019-12-29 DIAGNOSIS — Z992 Dependence on renal dialysis: Secondary | ICD-10-CM | POA: Diagnosis not present

## 2019-12-31 DIAGNOSIS — E1129 Type 2 diabetes mellitus with other diabetic kidney complication: Secondary | ICD-10-CM | POA: Diagnosis not present

## 2019-12-31 DIAGNOSIS — D631 Anemia in chronic kidney disease: Secondary | ICD-10-CM | POA: Diagnosis not present

## 2019-12-31 DIAGNOSIS — Z992 Dependence on renal dialysis: Secondary | ICD-10-CM | POA: Diagnosis not present

## 2019-12-31 DIAGNOSIS — E119 Type 2 diabetes mellitus without complications: Secondary | ICD-10-CM | POA: Diagnosis not present

## 2019-12-31 DIAGNOSIS — N2581 Secondary hyperparathyroidism of renal origin: Secondary | ICD-10-CM | POA: Diagnosis not present

## 2019-12-31 DIAGNOSIS — N186 End stage renal disease: Secondary | ICD-10-CM | POA: Diagnosis not present

## 2020-01-02 DIAGNOSIS — N2581 Secondary hyperparathyroidism of renal origin: Secondary | ICD-10-CM | POA: Diagnosis not present

## 2020-01-02 DIAGNOSIS — E119 Type 2 diabetes mellitus without complications: Secondary | ICD-10-CM | POA: Diagnosis not present

## 2020-01-02 DIAGNOSIS — N186 End stage renal disease: Secondary | ICD-10-CM | POA: Diagnosis not present

## 2020-01-02 DIAGNOSIS — Z992 Dependence on renal dialysis: Secondary | ICD-10-CM | POA: Diagnosis not present

## 2020-01-02 DIAGNOSIS — D631 Anemia in chronic kidney disease: Secondary | ICD-10-CM | POA: Diagnosis not present

## 2020-01-02 DIAGNOSIS — E1129 Type 2 diabetes mellitus with other diabetic kidney complication: Secondary | ICD-10-CM | POA: Diagnosis not present

## 2020-01-05 DIAGNOSIS — E1129 Type 2 diabetes mellitus with other diabetic kidney complication: Secondary | ICD-10-CM | POA: Diagnosis not present

## 2020-01-05 DIAGNOSIS — N2581 Secondary hyperparathyroidism of renal origin: Secondary | ICD-10-CM | POA: Diagnosis not present

## 2020-01-05 DIAGNOSIS — E119 Type 2 diabetes mellitus without complications: Secondary | ICD-10-CM | POA: Diagnosis not present

## 2020-01-05 DIAGNOSIS — N186 End stage renal disease: Secondary | ICD-10-CM | POA: Diagnosis not present

## 2020-01-05 DIAGNOSIS — Z992 Dependence on renal dialysis: Secondary | ICD-10-CM | POA: Diagnosis not present

## 2020-01-05 DIAGNOSIS — D631 Anemia in chronic kidney disease: Secondary | ICD-10-CM | POA: Diagnosis not present

## 2020-01-07 DIAGNOSIS — N186 End stage renal disease: Secondary | ICD-10-CM | POA: Diagnosis not present

## 2020-01-07 DIAGNOSIS — Z992 Dependence on renal dialysis: Secondary | ICD-10-CM | POA: Diagnosis not present

## 2020-01-07 DIAGNOSIS — E1129 Type 2 diabetes mellitus with other diabetic kidney complication: Secondary | ICD-10-CM | POA: Diagnosis not present

## 2020-01-07 DIAGNOSIS — E119 Type 2 diabetes mellitus without complications: Secondary | ICD-10-CM | POA: Diagnosis not present

## 2020-01-07 DIAGNOSIS — N2581 Secondary hyperparathyroidism of renal origin: Secondary | ICD-10-CM | POA: Diagnosis not present

## 2020-01-07 DIAGNOSIS — D631 Anemia in chronic kidney disease: Secondary | ICD-10-CM | POA: Diagnosis not present

## 2020-01-09 DIAGNOSIS — E119 Type 2 diabetes mellitus without complications: Secondary | ICD-10-CM | POA: Diagnosis not present

## 2020-01-09 DIAGNOSIS — N2581 Secondary hyperparathyroidism of renal origin: Secondary | ICD-10-CM | POA: Diagnosis not present

## 2020-01-09 DIAGNOSIS — N186 End stage renal disease: Secondary | ICD-10-CM | POA: Diagnosis not present

## 2020-01-09 DIAGNOSIS — Z992 Dependence on renal dialysis: Secondary | ICD-10-CM | POA: Diagnosis not present

## 2020-01-09 DIAGNOSIS — D631 Anemia in chronic kidney disease: Secondary | ICD-10-CM | POA: Diagnosis not present

## 2020-01-09 DIAGNOSIS — E1129 Type 2 diabetes mellitus with other diabetic kidney complication: Secondary | ICD-10-CM | POA: Diagnosis not present

## 2020-01-12 DIAGNOSIS — N186 End stage renal disease: Secondary | ICD-10-CM | POA: Diagnosis not present

## 2020-01-12 DIAGNOSIS — E1129 Type 2 diabetes mellitus with other diabetic kidney complication: Secondary | ICD-10-CM | POA: Diagnosis not present

## 2020-01-12 DIAGNOSIS — Z992 Dependence on renal dialysis: Secondary | ICD-10-CM | POA: Diagnosis not present

## 2020-01-12 DIAGNOSIS — N2581 Secondary hyperparathyroidism of renal origin: Secondary | ICD-10-CM | POA: Diagnosis not present

## 2020-01-12 DIAGNOSIS — E119 Type 2 diabetes mellitus without complications: Secondary | ICD-10-CM | POA: Diagnosis not present

## 2020-01-12 DIAGNOSIS — D631 Anemia in chronic kidney disease: Secondary | ICD-10-CM | POA: Diagnosis not present

## 2020-01-14 ENCOUNTER — Other Ambulatory Visit: Payer: Self-pay | Admitting: Family Medicine

## 2020-01-31 ENCOUNTER — Other Ambulatory Visit: Payer: Self-pay | Admitting: Family Medicine

## 2020-02-10 NOTE — Patient Instructions (Signed)
Your procedure is scheduled on: 02/18/2020  Report to [name] at    7:05 AM.  Call this number if you have problems the morning of surgery: [phone]   Remember:   Do not Eat or Drink after midnight         No Smoking the morning of surgery  :  Take these medicines the morning of surgery with A SIP OF WATER:Pantoprazole and Bystolic    Do not wear jewelry, make-up or nail polish.  Do not wear lotions, powders, or perfumes. You may wear deodorant.  Do not shave 48 hours prior to surgery. Men may shave face and neck.  Do not bring valuables to the hospital.  Contacts, dentures or bridgework may not be worn into surgery.  Leave suitcase in the car. After surgery it may be brought to your room.  For patients admitted to the hospital, checkout time is 11:00 AM the day of discharge.   Patients discharged the day of surgery will not be allowed to drive home.    Special Instructions: Shower using CHG night before surgery and shower the day of surgery use CHG.  Use special wash - you have one bottle of CHG for all showers.  You should use approximately 1/2 of the bottle for each shower.  How to Use Chlorhexidine for Bathing Chlorhexidine gluconate (CHG) is a germ-killing (antiseptic) solution that is used to clean the skin. It can get rid of the bacteria that normally live on the skin and can keep them away for about 24 hours. To clean your skin with CHG, you may be given:  A CHG solution to use in the shower or as part of a sponge bath.  A prepackaged cloth that contains CHG. Cleaning your skin with CHG may help lower the risk for infection:  While you are staying in the intensive care unit of the hospital.  If you have a vascular access, such as a central line, to provide short-term or long-term access to your veins.  If you have a catheter to drain urine from your bladder.  If you are on a ventilator. A ventilator is a machine that helps you breathe by moving air in and out of  your lungs.  After surgery. What are the risks? Risks of using CHG include:  A skin reaction.  Hearing loss, if CHG gets in your ears.  Eye injury, if CHG gets in your eyes and is not rinsed out.  The CHG product catching fire. Make sure that you avoid smoking and flames after applying CHG to your skin. Do not use CHG:  If you have a chlorhexidine allergy or have previously reacted to chlorhexidine.  On babies younger than 43 months of age. How to use CHG solution  Use CHG only as told by your health care provider, and follow the instructions on the label.  Use the full amount of CHG as directed. Usually, this is one bottle. During a shower Follow these steps when using CHG solution during a shower (unless your health care provider gives you different instructions): 1. Start the shower. 2. Use your normal soap and shampoo to wash your face and hair. 3. Turn off the shower or move out of the shower stream. 4. Pour the CHG onto a clean washcloth. Do not use any type of brush or rough-edged sponge. 5. Starting at your neck, lather your body down to your toes. Make sure you follow these instructions: ? If you will be having surgery, pay  special attention to the part of your body where you will be having surgery. Scrub this area for at least 1 minute. ? Do not use CHG on your head or face. If the solution gets into your ears or eyes, rinse them well with water. ? Avoid your genital area. ? Avoid any areas of skin that have broken skin, cuts, or scrapes. ? Scrub your back and under your arms. Make sure to wash skin folds. 6. Let the lather sit on your skin for 1-2 minutes or as long as told by your health care provider. 7. Thoroughly rinse your entire body in the shower. Make sure that all body creases and crevices are rinsed well. 8. Dry off with a clean towel. Do not put any substances on your body afterward--such as powder, lotion, or perfume--unless you are told to do so by your  health care provider. Only use lotions that are recommended by the manufacturer. 9. Put on clean clothes or pajamas. 10. If it is the night before your surgery, sleep in clean sheets.  During a sponge bath Follow these steps when using CHG solution during a sponge bath (unless your health care provider gives you different instructions): 1. Use your normal soap and shampoo to wash your face and hair. 2. Pour the CHG onto a clean washcloth. 3. Starting at your neck, lather your body down to your toes. Make sure you follow these instructions: ? If you will be having surgery, pay special attention to the part of your body where you will be having surgery. Scrub this area for at least 1 minute. ? Do not use CHG on your head or face. If the solution gets into your ears or eyes, rinse them well with water. ? Avoid your genital area. ? Avoid any areas of skin that have broken skin, cuts, or scrapes. ? Scrub your back and under your arms. Make sure to wash skin folds. 4. Let the lather sit on your skin for 1-2 minutes or as long as told by your health care provider. 5. Using a different clean, wet washcloth, thoroughly rinse your entire body. Make sure that all body creases and crevices are rinsed well. 6. Dry off with a clean towel. Do not put any substances on your body afterward--such as powder, lotion, or perfume--unless you are told to do so by your health care provider. Only use lotions that are recommended by the manufacturer. 7. Put on clean clothes or pajamas. 8. If it is the night before your surgery, sleep in clean sheets. How to use CHG prepackaged cloths  Only use CHG cloths as told by your health care provider, and follow the instructions on the label.  Use the CHG cloth on clean, dry skin.  Do not use the CHG cloth on your head or face unless your health care provider tells you to.  When washing with the CHG cloth: ? Avoid your genital area. ? Avoid any areas of skin that have  broken skin, cuts, or scrapes. Before surgery Follow these steps when using a CHG cloth to clean before surgery (unless your health care provider gives you different instructions): 1. Using the CHG cloth, vigorously scrub the part of your body where you will be having surgery. Scrub using a back-and-forth motion for 3 minutes. The area on your body should be completely wet with CHG when you are done scrubbing. 2. Do not rinse. Discard the cloth and let the area air-dry. Do not put any substances on the  area afterward, such as powder, lotion, or perfume. 3. Put on clean clothes or pajamas. 4. If it is the night before your surgery, sleep in clean sheets.  For general bathing Follow these steps when using CHG cloths for general bathing (unless your health care provider gives you different instructions). 1. Use a separate CHG cloth for each area of your body. Make sure you wash between any folds of skin and between your fingers and toes. Wash your body in the following order, switching to a new cloth after each step: ? The front of your neck, shoulders, and chest. ? Both of your arms, under your arms, and your hands. ? Your stomach and groin area, avoiding the genitals. ? Your right leg and foot. ? Your left leg and foot. ? The back of your neck, your back, and your buttocks. 2. Do not rinse. Discard the cloth and let the area air-dry. Do not put any substances on your body afterward--such as powder, lotion, or perfume--unless you are told to do so by your health care provider. Only use lotions that are recommended by the manufacturer. 3. Put on clean clothes or pajamas. Contact a health care provider if:  Your skin gets irritated after scrubbing.  You have questions about using your solution or cloth. Get help right away if:  Your eyes become very red or swollen.  Your eyes itch badly.  Your skin itches badly and is red or swollen.  Your hearing changes.  You have trouble  seeing.  You have swelling or tingling in your mouth or throat.  You have trouble breathing.  You swallow any chlorhexidine. Summary  Chlorhexidine gluconate (CHG) is a germ-killing (antiseptic) solution that is used to clean the skin. Cleaning your skin with CHG may help to lower your risk for infection.  You may be given CHG to use for bathing. It may be in a bottle or in a prepackaged cloth to use on your skin. Carefully follow your health care provider's instructions and the instructions on the product label.  Do not use CHG if you have a chlorhexidine allergy.  Contact your health care provider if your skin gets irritated after scrubbing. This information is not intended to replace advice given to you by your health care provider. Make sure you discuss any questions you have with your health care provider. Document Revised: 10/17/2018 Document Reviewed: 06/28/2017 Elsevier Patient Education  [place].  Total or Modified Radical Mastectomy A total mastectomy and a modified radical mastectomy are surgeries that are done as part of treatment for breast cancer. You will have one of those types of surgery. Both types involve removing a breast.  In a total mastectomy (simple mastectomy), all breast tissue including the nipple will be removed.  In a modified radical mastectomy, lymph nodes under the arm will be removed along with the breast and nipple. Some of the lining over the muscle tissues under the breast may also be removed. These procedures may also be used to help prevent breast cancer. A preventive (prophylactic) mastectomy may be done if you are at an increased risk of breast cancer due to harmful changes (mutations) in certain genes (BRCA genes). In that case, the procedure involves removing both of your breasts. This can reduce your risk of developing breast cancer in the future. For a transgender person, a total mastectomy may be done as part of a surgical transition  from female to female. Let your health care provider know about:  Any allergies  you have.  All medicines you are taking, including vitamins, herbs, eye drops, creams, and over-the-counter medicines.  Any problems you or family members have had with anesthetic medicines.  Any blood disorders you have.  Any surgeries you have had.  Any medical conditions you have.  Whether you are pregnant or may be pregnant. What are the risks? Generally, this is a safe procedure. However, problems may occur, including:  Pain.  Infection.  Bleeding.  Allergic reactions to medicines.  Scar tissue.  Chest numbness on the side of the surgery.  Fluid buildup under the skin flaps where your breast was removed (seroma).  Sensation of throbbing or tingling.  Stress or sadness from losing your breast. If you have the lymph nodes under your arm removed, you may have arm swelling, weakness, or numbness on the same side of your body as your surgery. What happens before the procedure? Staying hydrated Follow instructions from your health care provider about hydration, which may include:  Up to 2 hours before the procedure - you may continue to drink clear liquids, such as water, clear fruit juice, black coffee, and plain tea. Eating and drinking restrictions Follow instructions from your health care provider about eating and drinking, which may include:  8 hours before the procedure - stop eating heavy meals or foods such as meat, fried foods, or fatty foods.  6 hours before the procedure - stop eating light meals or foods, such as toast or cereal.  6 hours before the procedure - stop drinking milk or drinks that contain milk.  2 hours before the procedure - stop drinking clear liquids. Medicines  Ask your health care provider about: ? Changing or stopping your regular medicines. This is especially important if you are taking diabetes medicines or blood thinners. ? Taking medicines such as  aspirin and ibuprofen. These medicines can thin your blood. Do not take these medicines unless your health care provider tells you to take them. ? Taking over-the-counter medicines, vitamins, herbs, and supplements.  Your health care team may give you antibiotic medicine to help prevent infection. General instructions  You may be checked for extra fluid around your lymph nodes (lymphedema).  Plan to have someone take you home from the hospital or clinic.  Plan to have a responsible adult care for you for at least 24 hours after you leave the hospital or clinic. This is important.  Ask your health care provider how your surgical site will be marked or identified.  You may be asked to shower with a germ-killing soap. What happens during the procedure?   To lower your risk of infection: ? Your health care team will wash or sanitize their hands. ? Your skin will be washed with soap.  An IV will be inserted into one of your veins.  You will be given a medicine to make you fall asleep (general anesthetic).  A wide incision will be made around your nipple. The skin and nipple inside the incision will be removed along with all breast tissue.  If you are having a modified radical mastectomy: ? The lining over your chest muscles will be removed. ? The incision may be extended to reach the lymph nodes under your arm, or a second incision may be made. ? Lymph nodes will be removed.  Breast tissue and lymph nodes that are removed will be sent to the lab for testing.  You may have a drainage tube inserted into your incision to collect fluid that builds up after  surgery. This tube will be connected to a suction bulb on the outside of your body to remove the fluid.  Your incision or incisions will be closed with stitches (sutures).  A bandage (dressing) will be placed over your breast area. If lymph nodes were removed, a dressing will also be placed under your arm. The procedure may vary  among health care providers and hospitals. What happens after the procedure?  Your blood pressure, heart rate, breathing rate, and blood oxygen level will be monitored until the medicines you were given have worn off.  You will be given pain medicine as needed.  You will be encouraged to get up and walk as soon as you can.  Your IV can be removed when you are able to eat and drink.  You may have a drainage tube in place for 2-3 days to prevent a collection of blood (hematoma) from developing in the breast area. You will be given instructions about caring for the drain before you go home.  A pressure bandage may be applied for 1-2 days to prevent bleeding or swelling. Ask your health care provider how to care for your pressure bandage at home. Summary  In a total mastectomy (simple mastectomy), all breast tissue including the nipple will be removed. In a modified radical mastectomy, the lymph nodes under the arm will be removed along with the breast and nipple.  Before the procedure, follow instructions from your health care provider about eating and drinking, and ask about changing or stopping your regular medicines.  You will be given a medicine to make you fall asleep (general anesthetic) during the procedure. This information is not intended to replace advice given to you by your health care provider. Make sure you discuss any questions you have with your health care provider. Document Revised: 10/04/2018 Document Reviewed: 05/04/2017 Elsevier Patient Education  2020 [place].  Total or Modified Radical Mastectomy, Care After This sheet gives you information about how to care for yourself after your procedure. Your health care provider may also give you more specific instructions. If you have problems or questions, contact your health care provider. What can I expect after the procedure? After the procedure, it is common to have:  Pain.  Numbness.  Stiffness in the arm or  shoulder.  Feelings of stress, sadness, or depression. If the lymph nodes under your arm were removed, you may have arm swelling, weakness, or numbness on the same side of your body as your surgery. Follow these instructions at home: Incision care   Follow instructions from your health care provider about how to take care of your incision. Make sure you: ? Wash your hands with soap and water before you change your bandage (dressing). If soap and water are not available, use hand sanitizer. ? Change your dressing as told by your health care provider. ? Leave stitches (sutures), skin glue, or adhesive strips in place. These skin closures may need to stay in place for 2 weeks or longer. If adhesive strip edges start to loosen and curl up, you may trim the loose edges. Do not remove adhesive strips completely unless your health care provider tells you to do that.  Check your incision area every day for signs of infection. Check for: ? Redness, swelling, or more pain. ? Fluid or blood. ? Warmth. ? Pus or a bad smell.  If you were sent home with a surgical drain in place, follow instructions from your health care provider about emptying it. Bathing  Do not take baths, swim, or use a hot tub until your health care provider approves. Ask your health care provider if you may take showers. You may only be allowed to take sponge baths. Activity  Return to your normal activities as told by your health care provider. Ask your health care provider what activities are safe for you.  Avoid activities that take a lot of effort.  Be careful to avoid any activities that could cause an injury to your arm on the side of your surgery.  Do not lift anything that is heavier than 10 lb (4.5 kg), or the limit that you are told, until your health care provider says that it is safe.  Avoid lifting with the arm on the side of your surgery.  Do not carry heavy objects on your shoulder.  After your drain is  removed, do exercises to prevent stiffness and swelling in your arm. Talk with your health care provider about which exercises are safe for you. General instructions  Take over-the-counter and prescription medicines only as told by your health care provider.  You may eat what you usually do.  Keep your arm raised (elevated) above the level of your heart when you are sitting or lying down.  Do not wear tight jewelry on your arm, wrist, or fingers on the side of your surgery.  You may be given a tight sleeve (compression bandage) to wear over your arm on the side of your surgery. Wear this sleeve as told by your health care provider.  Ask your health care provider when you can start wearing a bra or using a breast prosthesis.  Before you are involved in certain procedures such as giving blood or having your blood pressure checked, tell all your health care providers if lymph nodes under your arm were removed. This is important information. Follow-up  Keep all follow-up visits as told by your health care provider. This is important.  Get checked for extra fluid around your lymph nodes (lymphedema) as often as told by your health care provider. Contact a health care provider if:  You have a fever.  Your pain medicine is not working.  Your arm swelling, weakness, or numbness has not improved after a few weeks.  You have new swelling in your breast area or arm.  You have redness, swelling, or more pain in your incision area.  You have fluid or blood coming from your incision.  Your incision feels warm to the touch.  You have pus or a bad smell coming from your incision. Get help right away if:  You have very bad pain in your breast area or arm.  You have chest pain.  You have difficulty breathing. Summary  Follow instructions from your health care provider about how to take care of your incision. Check your incision area every day for signs of infection.  Ask your health  care provider what activities are safe for you.  Keep all follow-up visits as told by your health care provider. This is important.  Make sure you know which symptoms should cause you to contact your health care provider or to get help right away. This information is not intended to replace advice given to you by your health care provider. Make sure you discuss any questions you have with your health care provider. Document Revised: 10/04/2018 Document Reviewed: 05/04/2017 Elsevier Patient Education  2020 [place] Anesthesia, Adult, Care After This sheet gives you information about how to care for  yourself after your procedure. Your health care provider may also give you more specific instructions. If you have problems or questions, contact your health care provider. What can I expect after the procedure? After the procedure, the following side effects are common:  Pain or discomfort at the IV site.  Nausea.  Vomiting.  Sore throat.  Trouble concentrating.  Feeling cold or chills.  Weak or tired.  Sleepiness and fatigue.  Soreness and body aches. These side effects can affect parts of the body that were not involved in surgery. Follow these instructions at home:  For at least 24 hours after the procedure:  Have a responsible adult stay with you. It is important to have someone help care for you until you are awake and alert.  Rest as needed.  Do not: ? Participate in activities in which you could fall or become injured. ? Drive. ? Use heavy machinery. ? Drink alcohol. ? Take sleeping pills or medicines that cause drowsiness. ? Make important decisions or sign legal documents. ? Take care of children on your own. Eating and drinking  Follow any instructions from your health care provider about eating or drinking restrictions.  When you feel hungry, start by eating small amounts of foods that are soft and easy to digest (bland), such as toast. Gradually  return to your regular diet.  Drink enough fluid to keep your urine pale yellow.  If you vomit, rehydrate by drinking water, juice, or clear broth. General instructions  If you have sleep apnea, surgery and certain medicines can increase your risk for breathing problems. Follow instructions from your health care provider about wearing your sleep device: ? Anytime you are sleeping, including during daytime naps. ? While taking prescription pain medicines, sleeping medicines, or medicines that make you drowsy.  Return to your normal activities as told by your health care provider. Ask your health care provider what activities are safe for you.  Take over-the-counter and prescription medicines only as told by your health care provider.  If you smoke, do not smoke without supervision.  Keep all follow-up visits as told by your health care provider. This is important. Contact a health care provider if:  You have nausea or vomiting that does not get better with medicine.  You cannot eat or drink without vomiting.  You have pain that does not get better with medicine.  You are unable to pass urine.  You develop a skin rash.  You have a fever.  You have redness around your IV site that gets worse. Get help right away if:  You have difficulty breathing.  You have chest pain.  You have blood in your urine or stool, or you vomit blood. Summary  After the procedure, it is common to have a sore throat or nausea. It is also common to feel tired.  Have a responsible adult stay with you for the first 24 hours after general anesthesia. It is important to have someone help care for you until you are awake and alert.  When you feel hungry, start by eating small amounts of foods that are soft and easy to digest (bland), such as toast. Gradually return to your regular diet.  Drink enough fluid to keep your urine pale yellow.  Return to your normal activities as told by your health care  provider. Ask your health care provider what activities are safe for you. This information is not intended to replace advice given to you by your health care provider. Make sure  you discuss any questions you have with your health care provider. Document Revised: 08/03/2017 Document Reviewed: 03/16/2017 Elsevier Patient Education  [place].

## 2020-02-11 NOTE — H&P (Signed)
Megan [name]; [id number]; 1961-06-17   HPI Patient is a 59 year old black female who was referred to my care by Dr. Dennard Schaumann, Dr. Delton Coombes, and the Breast center for evaluation treatment of a recurrent right breast cancer.  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  She underwent chemotherapy at that time.  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  Her original cancer was in the upper, outer quadrant of the right breast.  The new lesion was found in the upper, inner quadrant of the right breast.  Patient does not feel a lump.  She denies any breast pain.  She has 0 out of 10 pain. Past Medical History:  Diagnosis Date  . (HFpEF) heart failure with preserved ejection fraction ([place])    a. 01/2019 Echo: EF 55-60%, mild conc LVH. DD.  Torn MV chordae.  . Anemia   . Atypical chest pain    a. 08/2018 MV: EF 59%, no ischemia; b. 02/2019 Cath: nonobs dzs.  . Blood transfusion without reported diagnosis   . Cataract   . ESRD (end stage renal disease) on dialysis ([place])    a. HD T, T, S  . Essential hypertension, benign   . GERD (gastroesophageal reflux disease)   . Headache   . Hemorrhoids   . Mixed hyperlipidemia   . Morbid obesity ([place])   . Non-obstructive CAD (coronary artery disease)    a. 02/2019 CathL LM nl, LAD 52m, LCX nl, RCA 25p, EF 55-65%.  Marland Kitchen PONV (postoperative nausea and vomiting)   . S/P colonoscopy Jan 2011   Dr. Benson Norway: sessile polyp (benign lymphoid), large hemorrhoids, repeat 5-10 years  . Temporal arteritis ([place])   . Type 2 diabetes mellitus ([place])   . Wears glasses     Past Surgical History:  Procedure Laterality Date  . ABDOMINAL HYSTERECTOMY    . APPENDECTOMY    . ARTERY BIOPSY N/A 05/09/2018   Procedure: RIGHT TEMPORAL ARTERY BIOPSY;  Surgeon:  Judeth Horn, MD;  Location: [facility];  Service: General;  Laterality: N/A;  . CATARACT EXTRACTION W/PHACO Left 02/09/2017   Procedure: CATARACT EXTRACTION PHACO AND INTRAOCULAR LENS PLACEMENT LEFT EYE;  Surgeon: Tonny Branch, MD;  [facility];  Service: Ophthalmology;  Laterality: Left;  CDE: 4.89  . CATARACT EXTRACTION W/PHACO Right 06/04/2017   Procedure: CATARACT EXTRACTION PHACO AND INTRAOCULAR LENS PLACEMENT (IOC);  Surgeon: Tonny Branch, MD;  Location: [facility];  Service: Ophthalmology;  Laterality: Right;  CDE: 4.12  . CHOLECYSTECTOMY  09/29/2011   Procedure: LAPAROSCOPIC CHOLECYSTECTOMY;  Surgeon: Jamesetta So, MD;  Location: [facility];  Service: General;  Laterality: N/A;  . COLONOSCOPY  Jan 2011   Dr. Benson Norway: sessile polyp (benign lymphoid), large hemorrhoids, repeat 5-10 years  . COLONOSCOPY N/A 06/12/2016   Procedure: COLONOSCOPY;  Surgeon: Daneil Dolin, MD;  [facility];  Service: Endoscopy;  Laterality: N/A;  1230   . ESOPHAGOGASTRODUODENOSCOPY  09/05/2011   ELF:YBOFB hiatal hernia; remainder of exam normal. No explanation for patient's abdominal pain with today's examination  . ESOPHAGOGASTRODUODENOSCOPY N/A 12/17/2013   Dr. [name]: gastric erythema, erosion, mild chronic inflammation on path   . LAPAROSCOPIC APPENDECTOMY  09/29/2011   Procedure: APPENDECTOMY LAPAROSCOPIC;  Surgeon: Jamesetta So, MD;  Location: [facility];  Service: General;;  incidental appendectomy  . LEFT  HEART CATH AND CORONARY ANGIOGRAPHY N/A 02/28/2019   Procedure: LEFT HEART CATH AND CORONARY ANGIOGRAPHY;  Surgeon: Jettie Booze, MD;  Location: [facility];  Service: Cardiovascular;  Laterality: N/A;  . PARTIAL MASTECTOMY WITH NEEDLE LOCALIZATION AND AXILLARY SENTINEL LYMPH NODE BX Right 09/18/2018   Procedure: RIGHT PARTIAL MASTECTOMY AFTER NEEDLE LOCALIZATION, SENTINEL LYMPH NODE BIOPSY RIGHT AXILLA;  Surgeon: Aviva Signs, MD;  Location: [facility];  Service: General;  Laterality: Right;     Family History  Problem Relation Age of Onset  . Hypertension Mother   . Coronary artery disease Mother   . Diabetes Mother   . Hypertension Sister   . Coronary artery disease Sister   . Hypertension Brother   . Heart attack Father   . Hypertension Son   . Heart attack Maternal Aunt   . Hypertension Maternal Aunt   . Diabetes Maternal Aunt   . Heart attack Maternal Uncle   . Hypertension Maternal Uncle   . Diabetes Maternal Uncle   . Heart attack Paternal Aunt   . Hypertension Paternal Aunt   . Diabetes Paternal Aunt   . Heart attack Paternal Uncle   . Hypertension Paternal Uncle   . Diabetes Paternal Uncle   . Heart attack Maternal Grandmother   . Heart attack Maternal Grandfather   . Heart attack Paternal Grandmother   . Heart attack Paternal Grandfather   . Colon cancer Neg Hx     Current Outpatient Medications on File Prior to Visit  Medication Sig Dispense Refill  . amLODipine (NORVASC) 10 MG tablet Take 2 tablets (20 mg total) by mouth at bedtime. 60 tablet 3  . anastrozole (ARIMIDEX) 1 MG tablet TAKE 1 TABLET(1 MG) BY MOUTH DAILY 30 tablet 4  . aspirin EC 81 MG tablet Take 81 mg by mouth daily.    Marland Kitchen atorvastatin (LIPITOR) 20 MG tablet TAKE 1 TABLET DAILY 90 tablet 3  . B Complex-C-Folic Acid (DIALYVITE 517) 0.8 MG TABS Take 800 mg by mouth daily.     . cinacalcet (SENSIPAR) 30 MG tablet Take 2 tablets (60 mg total) by mouth daily. (Patient taking differently: Take 30 mg by mouth every other day. ) 90 tablet 0  . DULoxetine (CYMBALTA) 30 MG capsule Take 30 mg by mouth daily.     Marland Kitchen gabapentin (NEURONTIN) 300 MG capsule TAKE 1 CAPSULE(300 MG) BY MOUTH AT BEDTIME 90 capsule 3  . lanthanum (FOSRENOL) 1000 MG chewable tablet Chew 1,000 mg by mouth 2 (two) times daily with a meal.     . lidocaine-prilocaine (EMLA) cream APPLY SMALL AMOUNT TO ACCESS SITE (AVF) 1 TO 2 HOURS BEFORE DIALYSIS. COVER WITH OCCLUSIVE DRESSING (SARAN WRAP)    . losartan (COZAAR) 50 MG tablet  TAKE 1 TABLET DAILY (DISCONTINUE LOSARTAN 100 MG) 90 tablet 3  . nebivolol (BYSTOLIC) 10 MG tablet Take 1 tablet (10 mg total) by mouth daily. (Patient taking differently: Take 10 mg by mouth 2 (two) times daily. ) 90 tablet 0  . oxyCODONE-acetaminophen (PERCOCET) 5-325 MG tablet Take 1 tablet by mouth every 8 (eight) hours as needed for severe pain. 15 tablet 0  . pantoprazole (PROTONIX) 40 MG tablet TAKE 1 TABLET(40 MG) BY MOUTH DAILY 90 tablet 3  . tiZANidine (ZANAFLEX) 4 MG capsule TAKE 1 CAPSULE(4 MG) BY MOUTH TWICE DAILY WITH A MEAL. START 1 CAPSULE AT NIGHT FOR 2 WEEKS AND THEN TWICE DAILY (Patient taking differently: Take 4 mg by mouth 2 (two) times daily as needed. ) 60  capsule 0  . valACYclovir (VALTREX) 500 MG tablet Take 1 tablet (500 mg total) by mouth daily. After dialysis for shingles 7 tablet 0  . nitroGLYCERIN (NITROSTAT) 0.4 MG SL tablet Place 1 tablet (0.4 mg total) under the tongue every 5 (five) minutes as needed for chest pain. 25 tablet 3   No current facility-administered medications on file prior to visit.    Allergies  Allergen Reactions  . Hydrocodone     itching    Social History   Substance and Sexual Activity  Alcohol Use No    Social History   Tobacco Use  Smoking Status Never Smoker  Smokeless Tobacco Never Used    Review of Systems  Constitutional: Negative.   HENT: Negative.   Eyes: Negative.   Respiratory: Negative.   Cardiovascular: Negative.   Gastrointestinal: Negative.   Genitourinary: Negative.   Musculoskeletal: Negative.   Skin: Negative.   Neurological: Negative.   Endo/Heme/Allergies: Negative.   Psychiatric/Behavioral: Negative.     Objective   Vitals:   09/18/19 1007  BP: 134/78  Pulse: 76  Resp: 16  Temp: 98.2 F (36.8 C)  SpO2: 97%    Physical Exam Vitals reviewed.  Constitutional:      Appearance: Normal appearance. She is normal weight. She is not ill-appearing.  HENT:     Head: Normocephalic and  atraumatic.  Cardiovascular:     Rate and Rhythm: Normal rate and regular rhythm.     Heart sounds: Normal heart sounds. No murmur. No friction rub. No gallop.   Pulmonary:     Effort: Pulmonary effort is normal. No respiratory distress.     Breath sounds: Normal breath sounds. No stridor. No wheezing, rhonchi or rales.  Lymphadenopathy:     Cervical: No cervical adenopathy.  Skin:    General: Skin is warm and dry.  Neurological:     Mental Status: She is alert and oriented to person, place, and time.   Breast: Healed surgical scar noted in the upper, outer quadrant of the right breast with a core biopsy site healing well in the upper, inner quadrant of the right breast.  No nipple discharge or dimpling is noted.  The axilla is negative for palpable nodes.  Left breast examination reveals no dominant mass, nipple discharge, or dimpling.  The axilla is negative for palpable nodes.  Oncology notes reviewed  Assessment  Recurrent right breast cancer Plan Patient is scheduled for right modified radical mastectomy on 02/18/2020.  The risks and benefits of the procedure including bleeding, infection, pain, right arm swelling, the possibility of a blood transfusion were fully explained to the patient, who gave informed consent.

## 2020-02-12 ENCOUNTER — Encounter (HOSPITAL_COMMUNITY)
Admission: RE | Admit: 2020-02-12 | Discharge: 2020-02-12 | Disposition: A | Discharge status: Home / Self Care | Payer: Medicare Other | Source: Ambulatory Visit | Attending: General Surgery | Admitting: General Surgery

## 2020-02-12 DIAGNOSIS — N040 Nephrotic syndrome with minor glomerular abnormality: Secondary | ICD-10-CM | POA: Diagnosis not present

## 2020-02-12 DIAGNOSIS — N186 End stage renal disease: Secondary | ICD-10-CM | POA: Diagnosis not present

## 2020-02-12 DIAGNOSIS — Z992 Dependence on renal dialysis: Secondary | ICD-10-CM | POA: Diagnosis not present

## 2020-02-13 DIAGNOSIS — N2581 Secondary hyperparathyroidism of renal origin: Secondary | ICD-10-CM | POA: Diagnosis not present

## 2020-02-13 DIAGNOSIS — E119 Type 2 diabetes mellitus without complications: Secondary | ICD-10-CM | POA: Diagnosis not present

## 2020-02-13 DIAGNOSIS — D509 Iron deficiency anemia, unspecified: Secondary | ICD-10-CM | POA: Diagnosis not present

## 2020-02-13 DIAGNOSIS — Z23 Encounter for immunization: Secondary | ICD-10-CM | POA: Diagnosis not present

## 2020-02-13 DIAGNOSIS — E1129 Type 2 diabetes mellitus with other diabetic kidney complication: Secondary | ICD-10-CM | POA: Diagnosis not present

## 2020-02-13 DIAGNOSIS — N186 End stage renal disease: Secondary | ICD-10-CM | POA: Diagnosis not present

## 2020-02-13 DIAGNOSIS — Z992 Dependence on renal dialysis: Secondary | ICD-10-CM | POA: Diagnosis not present

## 2020-02-13 DIAGNOSIS — D631 Anemia in chronic kidney disease: Secondary | ICD-10-CM | POA: Diagnosis not present

## 2020-02-16 DIAGNOSIS — D509 Iron deficiency anemia, unspecified: Secondary | ICD-10-CM | POA: Diagnosis not present

## 2020-02-16 DIAGNOSIS — D631 Anemia in chronic kidney disease: Secondary | ICD-10-CM | POA: Diagnosis not present

## 2020-02-16 DIAGNOSIS — N186 End stage renal disease: Secondary | ICD-10-CM | POA: Diagnosis not present

## 2020-02-16 DIAGNOSIS — N2581 Secondary hyperparathyroidism of renal origin: Secondary | ICD-10-CM | POA: Diagnosis not present

## 2020-02-16 DIAGNOSIS — E1129 Type 2 diabetes mellitus with other diabetic kidney complication: Secondary | ICD-10-CM | POA: Diagnosis not present

## 2020-02-16 DIAGNOSIS — Z992 Dependence on renal dialysis: Secondary | ICD-10-CM | POA: Diagnosis not present

## 2020-02-17 ENCOUNTER — Other Ambulatory Visit (HOSPITAL_COMMUNITY)
Admission: RE | Admit: 2020-02-17 | Discharge: 2020-02-17 | Disposition: A | Discharge status: Home / Self Care | Payer: Medicare Other | Source: Ambulatory Visit | Attending: General Surgery | Admitting: General Surgery

## 2020-02-17 ENCOUNTER — Encounter (HOSPITAL_COMMUNITY): Payer: Self-pay

## 2020-02-17 ENCOUNTER — Other Ambulatory Visit: Payer: Self-pay

## 2020-02-17 ENCOUNTER — Ambulatory Visit (HOSPITAL_COMMUNITY)
Admission: RE | Admit: 2020-02-17 | Discharge: 2020-02-17 | Disposition: A | Discharge status: Home / Self Care | Payer: Medicare Other | Source: Ambulatory Visit | Attending: General Surgery | Admitting: General Surgery

## 2020-02-17 ENCOUNTER — Encounter (HOSPITAL_COMMUNITY)
Admission: RE | Admit: 2020-02-17 | Discharge: 2020-02-17 | Disposition: A | Discharge status: Home / Self Care | Payer: Medicare Other | Source: Ambulatory Visit | Attending: General Surgery | Admitting: General Surgery

## 2020-02-17 DIAGNOSIS — Z01812 Encounter for preprocedural laboratory examination: Secondary | ICD-10-CM | POA: Diagnosis not present

## 2020-02-17 DIAGNOSIS — C50911 Malignant neoplasm of unspecified site of right female breast: Secondary | ICD-10-CM

## 2020-02-17 DIAGNOSIS — Z20822 Contact with and (suspected) exposure to covid-19: Secondary | ICD-10-CM | POA: Insufficient documentation

## 2020-02-17 LAB — CBC WITH DIFFERENTIAL/PLATELET
Abs Immature Granulocytes: 0.03 10*3/uL (ref 0.00–0.07)
Basophils Absolute: 0.1 10*3/uL (ref 0.0–0.1)
Basophils Relative: 1 %
Eosinophils Absolute: 0.2 10*3/uL (ref 0.0–0.5)
Eosinophils Relative: 3 %
HCT: 34.1 % — ABNORMAL LOW (ref 36.0–46.0)
Hemoglobin: 10.6 g/dL — ABNORMAL LOW (ref 12.0–15.0)
Immature Granulocytes: 0 %
Lymphocytes Relative: 21 %
Lymphs Abs: 1.8 10*3/uL (ref 0.7–4.0)
MCH: 32.2 pg (ref 26.0–34.0)
MCHC: 31.1 g/dL (ref 30.0–36.0)
MCV: 103.6 fL — ABNORMAL HIGH (ref 80.0–100.0)
Monocytes Absolute: 1 10*3/uL (ref 0.1–1.0)
Monocytes Relative: 12 %
Neutro Abs: 5.5 10*3/uL (ref 1.7–7.7)
Neutrophils Relative %: 63 %
Platelets: 321 10*3/uL (ref 150–400)
RBC: 3.29 MIL/uL — ABNORMAL LOW (ref 3.87–5.11)
RDW: 15.3 % (ref 11.5–15.5)
WBC: 8.7 10*3/uL (ref 4.0–10.5)
nRBC: 0 % (ref 0.0–0.2)

## 2020-02-17 LAB — COMPREHENSIVE METABOLIC PANEL
ALT: 23 U/L (ref 0–44)
AST: 23 U/L (ref 15–41)
Albumin: 3.7 g/dL (ref 3.5–5.0)
Alkaline Phosphatase: 77 U/L (ref 38–126)
Anion gap: 19 — ABNORMAL HIGH (ref 5–15)
BUN: 58 mg/dL — ABNORMAL HIGH (ref 6–20)
CO2: 25 mmol/L (ref 22–32)
Calcium: 9.8 mg/dL (ref 8.9–10.3)
Chloride: 97 mmol/L — ABNORMAL LOW (ref 98–111)
Creatinine, Ser: 9.26 mg/dL — ABNORMAL HIGH (ref 0.44–1.00)
GFR calc Af Amer: 5 mL/min — ABNORMAL LOW (ref >60)
GFR calc non Af Amer: 4 mL/min — ABNORMAL LOW (ref >60)
Glucose, Bld: 106 mg/dL — ABNORMAL HIGH (ref 70–99)
Potassium: 3.4 mmol/L — ABNORMAL LOW (ref 3.5–5.1)
Sodium: 141 mmol/L (ref 135–145)
Total Bilirubin: 0.6 mg/dL (ref 0.3–1.2)
Total Protein: 7.7 g/dL (ref 6.5–8.1)

## 2020-02-17 LAB — SARS CORONAVIRUS 2 (TAT 6-24 HRS): SARS Coronavirus 2: NEGATIVE

## 2020-02-17 LAB — TYPE AND SCREEN
ABO/RH(D): B POS
Antibody Screen: NEGATIVE

## 2020-02-18 ENCOUNTER — Encounter (HOSPITAL_COMMUNITY)
Admission: RE | Disposition: A | Discharge status: Home / Self Care | Payer: Self-pay | Source: Home / Self Care | Attending: Family Medicine

## 2020-02-18 ENCOUNTER — Observation Stay (HOSPITAL_COMMUNITY)
Admission: RE | Admit: 2020-02-18 | Discharge: 2020-02-19 | Disposition: A | Discharge status: Home / Self Care | Payer: Medicare Other | Attending: Orthopedic Surgery | Admitting: Orthopedic Surgery

## 2020-02-18 ENCOUNTER — Ambulatory Visit (HOSPITAL_COMMUNITY): Payer: Medicare Other | Admitting: Anesthesiology

## 2020-02-18 DIAGNOSIS — D631 Anemia in chronic kidney disease: Secondary | ICD-10-CM | POA: Diagnosis not present

## 2020-02-18 DIAGNOSIS — Z992 Dependence on renal dialysis: Secondary | ICD-10-CM | POA: Insufficient documentation

## 2020-02-18 DIAGNOSIS — I5030 Unspecified diastolic (congestive) heart failure: Secondary | ICD-10-CM | POA: Diagnosis not present

## 2020-02-18 DIAGNOSIS — E46 Unspecified protein-calorie malnutrition: Secondary | ICD-10-CM | POA: Diagnosis present

## 2020-02-18 DIAGNOSIS — Z79899 Other long term (current) drug therapy: Secondary | ICD-10-CM | POA: Insufficient documentation

## 2020-02-18 DIAGNOSIS — E1122 Type 2 diabetes mellitus with diabetic chronic kidney disease: Secondary | ICD-10-CM | POA: Insufficient documentation

## 2020-02-18 DIAGNOSIS — Z7982 Long term (current) use of aspirin: Secondary | ICD-10-CM | POA: Diagnosis not present

## 2020-02-18 DIAGNOSIS — N6489 Other specified disorders of breast: Secondary | ICD-10-CM | POA: Diagnosis not present

## 2020-02-18 DIAGNOSIS — K219 Gastro-esophageal reflux disease without esophagitis: Secondary | ICD-10-CM | POA: Diagnosis present

## 2020-02-18 DIAGNOSIS — C50211 Malignant neoplasm of upper-inner quadrant of right female breast: Secondary | ICD-10-CM

## 2020-02-18 DIAGNOSIS — N186 End stage renal disease: Secondary | ICD-10-CM

## 2020-02-18 DIAGNOSIS — C50912 Malignant neoplasm of unspecified site of left female breast: Secondary | ICD-10-CM | POA: Diagnosis not present

## 2020-02-18 DIAGNOSIS — N641 Fat necrosis of breast: Secondary | ICD-10-CM | POA: Diagnosis not present

## 2020-02-18 DIAGNOSIS — I1311 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, with stage 5 chronic kidney disease, or end stage renal disease: Secondary | ICD-10-CM | POA: Diagnosis not present

## 2020-02-18 DIAGNOSIS — Z9011 Acquired absence of right breast and nipple: Secondary | ICD-10-CM | POA: Diagnosis not present

## 2020-02-18 DIAGNOSIS — N189 Chronic kidney disease, unspecified: Secondary | ICD-10-CM | POA: Diagnosis present

## 2020-02-18 DIAGNOSIS — E119 Type 2 diabetes mellitus without complications: Secondary | ICD-10-CM

## 2020-02-18 DIAGNOSIS — I110 Hypertensive heart disease with heart failure: Secondary | ICD-10-CM | POA: Diagnosis present

## 2020-02-18 DIAGNOSIS — I132 Hypertensive heart and chronic kidney disease with heart failure and with stage 5 chronic kidney disease, or end stage renal disease: Secondary | ICD-10-CM | POA: Diagnosis not present

## 2020-02-18 DIAGNOSIS — Z171 Estrogen receptor negative status [ER-]: Secondary | ICD-10-CM | POA: Diagnosis not present

## 2020-02-18 DIAGNOSIS — Z170 Estrogen receptor positive status [ER+]: Secondary | ICD-10-CM | POA: Diagnosis not present

## 2020-02-18 DIAGNOSIS — C50911 Malignant neoplasm of unspecified site of right female breast: Principal | ICD-10-CM | POA: Insufficient documentation

## 2020-02-18 DIAGNOSIS — C50919 Malignant neoplasm of unspecified site of unspecified female breast: Secondary | ICD-10-CM | POA: Diagnosis present

## 2020-02-18 HISTORY — PX: MASTECTOMY MODIFIED RADICAL: SHX5962

## 2020-02-18 LAB — HIV ANTIBODY (ROUTINE TESTING W REFLEX): HIV Screen 4th Generation wRfx: NONREACTIVE

## 2020-02-18 LAB — GLUCOSE, CAPILLARY: Glucose-Capillary: 115 mg/dL — ABNORMAL HIGH (ref 70–99)

## 2020-02-18 SURGERY — MASTECTOMY, MODIFIED RADICAL
Anesthesia: General | Site: Breast | Laterality: Right

## 2020-02-18 MED ORDER — DEXAMETHASONE SODIUM PHOSPHATE 4 MG/ML IJ SOLN
INTRAMUSCULAR | Status: DC | PRN
  Administered 2020-02-18: 4 mg via INTRAVENOUS

## 2020-02-18 MED ORDER — NEOSTIGMINE METHYLSULFATE 10 MG/10ML IV SOLN
INTRAVENOUS | Status: DC | PRN
  Administered 2020-02-18: 5 mg via INTRAVENOUS

## 2020-02-18 MED ORDER — SODIUM CHLORIDE 0.9 % IV SOLN: 100.0000 mL | INTRAVENOUS | Status: DC | PRN

## 2020-02-18 MED ORDER — LIDOCAINE-PRILOCAINE 2.5-2.5 % EX CREA: 1.0000 "application " | TOPICAL_CREAM | CUTANEOUS | Status: DC | PRN

## 2020-02-18 MED ORDER — DULOXETINE HCL 30 MG PO CPEP
30.0000 mg | ORAL_CAPSULE | Freq: Every day | ORAL | Status: DC
  Filled 2020-02-18: qty 1

## 2020-02-18 MED ORDER — HEPARIN SODIUM (PORCINE) 5000 UNIT/ML IJ SOLN: 5000.0000 [IU] | Freq: Three times a day (TID) | INTRAMUSCULAR | Status: DC

## 2020-02-18 MED ORDER — PANTOPRAZOLE SODIUM 40 MG PO TBEC
40.0000 mg | DELAYED_RELEASE_TABLET | Freq: Every day | ORAL | Status: DC
  Filled 2020-02-18: qty 1

## 2020-02-18 MED ORDER — GLYCOPYRROLATE PF 0.2 MG/ML IJ SOSY
PREFILLED_SYRINGE | INTRAMUSCULAR | Status: AC
  Filled 2020-02-18: qty 1

## 2020-02-18 MED ORDER — PHENYLEPHRINE 40 MCG/ML (10ML) SYRINGE FOR IV PUSH (FOR BLOOD PRESSURE SUPPORT)
PREFILLED_SYRINGE | INTRAVENOUS | Status: AC
  Filled 2020-02-18: qty 10

## 2020-02-18 MED ORDER — CHLORHEXIDINE GLUCONATE CLOTH 2 % EX PADS: 6.0000 | MEDICATED_PAD | Freq: Once | CUTANEOUS | Status: DC

## 2020-02-18 MED ORDER — ONDANSETRON 4 MG PO TBDP: 4.0000 mg | ORAL_TABLET | Freq: Four times a day (QID) | ORAL | Status: DC | PRN

## 2020-02-18 MED ORDER — FENTANYL CITRATE (PF) 100 MCG/2ML IJ SOLN
25.0000 ug | INTRAMUSCULAR | Status: DC | PRN
  Administered 2020-02-18: 25 ug via INTRAVENOUS
  Filled 2020-02-18: qty 2

## 2020-02-18 MED ORDER — ONDANSETRON HCL 4 MG PO TABS
4.0000 mg | ORAL_TABLET | Freq: Four times a day (QID) | ORAL | Status: DC | PRN
  Filled 2020-02-18: qty 1

## 2020-02-18 MED ORDER — CHLORHEXIDINE GLUCONATE CLOTH 2 % EX PADS
6.0000 | MEDICATED_PAD | Freq: Every day | CUTANEOUS | Status: DC
  Administered 2020-02-19: 6 via TOPICAL

## 2020-02-18 MED ORDER — LIDOCAINE 2% (20 MG/ML) 5 ML SYRINGE
INTRAMUSCULAR | Status: AC
  Filled 2020-02-18: qty 5

## 2020-02-18 MED ORDER — HYDROMORPHONE HCL 1 MG/ML IJ SOLN
0.2500 mg | INTRAMUSCULAR | Status: DC | PRN
  Filled 2020-02-18: qty 0.5

## 2020-02-18 MED ORDER — ACETAMINOPHEN 650 MG RE SUPP
650.0000 mg | Freq: Four times a day (QID) | RECTAL | Status: DC | PRN
  Filled 2020-02-18: qty 1

## 2020-02-18 MED ORDER — ACETAMINOPHEN 325 MG PO TABS: 650.0000 mg | ORAL_TABLET | Freq: Four times a day (QID) | ORAL | Status: DC | PRN

## 2020-02-18 MED ORDER — ENOXAPARIN SODIUM 40 MG/0.4ML ~~LOC~~ SOLN
40.0000 mg | Freq: Once | SUBCUTANEOUS | Status: AC
  Administered 2020-02-18: 40 mg via SUBCUTANEOUS
  Filled 2020-02-18: qty 0.4

## 2020-02-18 MED ORDER — ONDANSETRON HCL 4 MG/2ML IJ SOLN: 4.0000 mg | Freq: Four times a day (QID) | INTRAMUSCULAR | Status: DC | PRN

## 2020-02-18 MED ORDER — ASPIRIN EC 81 MG PO TBEC
81.0000 mg | DELAYED_RELEASE_TABLET | Freq: Every day | ORAL | Status: DC
  Filled 2020-02-18: qty 1

## 2020-02-18 MED ORDER — CHLORHEXIDINE GLUCONATE 0.12 % MT SOLN
15.0000 mL | Freq: Once | OROMUCOSAL | Status: AC
  Administered 2020-02-18: 15 mL via OROMUCOSAL

## 2020-02-18 MED ORDER — EPHEDRINE 5 MG/ML INJ
INTRAVENOUS | Status: AC
  Filled 2020-02-18: qty 10

## 2020-02-18 MED ORDER — ACETAMINOPHEN 650 MG RE SUPP: 650.0000 mg | Freq: Four times a day (QID) | RECTAL | Status: DC | PRN

## 2020-02-18 MED ORDER — SODIUM CHLORIDE 0.9 % IV SOLN: INTRAVENOUS | Status: DC

## 2020-02-18 MED ORDER — SCOPOLAMINE 1 MG/3DAYS TD PT72
1.0000 | MEDICATED_PATCH | Freq: Once | TRANSDERMAL | Status: DC
  Administered 2020-02-18: 1.5 mg via TRANSDERMAL
  Filled 2020-02-18: qty 1

## 2020-02-18 MED ORDER — PHENYLEPHRINE HCL (PRESSORS) 10 MG/ML IV SOLN
INTRAVENOUS | Status: DC | PRN
  Administered 2020-02-18 (×2): 100 ug via INTRAVENOUS

## 2020-02-18 MED ORDER — GABAPENTIN 300 MG PO CAPS
300.0000 mg | ORAL_CAPSULE | Freq: Every day | ORAL | Status: DC
  Administered 2020-02-18: 300 mg via ORAL
  Filled 2020-02-18: qty 1

## 2020-02-18 MED ORDER — ROCURONIUM BROMIDE 10 MG/ML (PF) SYRINGE
PREFILLED_SYRINGE | INTRAVENOUS | Status: AC
  Filled 2020-02-18: qty 10

## 2020-02-18 MED ORDER — SUCCINYLCHOLINE CHLORIDE 20 MG/ML IJ SOLN
INTRAMUSCULAR | Status: DC | PRN
  Administered 2020-02-18: 160 mg via INTRAVENOUS

## 2020-02-18 MED ORDER — 0.9 % SODIUM CHLORIDE (POUR BTL) OPTIME
TOPICAL | Status: DC | PRN
  Administered 2020-02-18: 1000 mL

## 2020-02-18 MED ORDER — LIDOCAINE HCL (CARDIAC) PF 100 MG/5ML IV SOSY
PREFILLED_SYRINGE | INTRAVENOUS | Status: DC | PRN
  Administered 2020-02-18: 100 mg via INTRAVENOUS

## 2020-02-18 MED ORDER — NEBIVOLOL HCL 10 MG PO TABS
10.0000 mg | ORAL_TABLET | Freq: Two times a day (BID) | ORAL | Status: DC
  Administered 2020-02-18: 10 mg via ORAL
  Filled 2020-02-18 (×2): qty 1

## 2020-02-18 MED ORDER — POLYETHYLENE GLYCOL 3350 17 G PO PACK
17.0000 g | PACK | Freq: Every day | ORAL | Status: DC | PRN
  Filled 2020-02-18: qty 1

## 2020-02-18 MED ORDER — PROMETHAZINE HCL 25 MG/ML IJ SOLN: 6.2500 mg | INTRAMUSCULAR | Status: DC | PRN

## 2020-02-18 MED ORDER — ORAL CARE MOUTH RINSE: 15.0000 mL | Freq: Once | OROMUCOSAL | Status: AC

## 2020-02-18 MED ORDER — HEPARIN SODIUM (PORCINE) 5000 UNIT/ML IJ SOLN
5000.0000 [IU] | Freq: Three times a day (TID) | INTRAMUSCULAR | Status: DC
  Administered 2020-02-19: 5000 [IU] via SUBCUTANEOUS
  Filled 2020-02-18 (×2): qty 1

## 2020-02-18 MED ORDER — ROCURONIUM BROMIDE 100 MG/10ML IV SOLN
INTRAVENOUS | Status: DC | PRN
  Administered 2020-02-18: 10 mg via INTRAVENOUS
  Administered 2020-02-18: 40 mg via INTRAVENOUS

## 2020-02-18 MED ORDER — PENTAFLUOROPROP-TETRAFLUOROETH EX AERO: 1.0000 "application " | INHALATION_SPRAY | CUTANEOUS | Status: DC | PRN

## 2020-02-18 MED ORDER — POVIDONE-IODINE 10 % OINT PACKET
TOPICAL_OINTMENT | CUTANEOUS | Status: DC | PRN
  Administered 2020-02-18: 2 via TOPICAL

## 2020-02-18 MED ORDER — ONDANSETRON HCL 4 MG/2ML IJ SOLN
INTRAMUSCULAR | Status: DC | PRN
  Administered 2020-02-18: 4 mg via INTRAVENOUS

## 2020-02-18 MED ORDER — ANASTROZOLE 1 MG PO TABS
1.0000 mg | ORAL_TABLET | Freq: Every day | ORAL | Status: DC
  Administered 2020-02-18: 1 mg via ORAL
  Filled 2020-02-18 (×3): qty 1

## 2020-02-18 MED ORDER — GLYCOPYRROLATE 0.2 MG/ML IJ SOLN
INTRAMUSCULAR | Status: DC | PRN
  Administered 2020-02-18: .8 mg via INTRAVENOUS
  Administered 2020-02-18: .2 mg via INTRAVENOUS

## 2020-02-18 MED ORDER — OXYCODONE-ACETAMINOPHEN 5-325 MG PO TABS
1.0000 | ORAL_TABLET | ORAL | Status: DC | PRN
  Filled 2020-02-18: qty 2

## 2020-02-18 MED ORDER — FENTANYL CITRATE (PF) 250 MCG/5ML IJ SOLN
INTRAMUSCULAR | Status: AC
  Filled 2020-02-18: qty 5

## 2020-02-18 MED ORDER — SUCCINYLCHOLINE CHLORIDE 200 MG/10ML IV SOSY
PREFILLED_SYRINGE | INTRAVENOUS | Status: AC
  Filled 2020-02-18: qty 10

## 2020-02-18 MED ORDER — ATORVASTATIN CALCIUM 20 MG PO TABS
20.0000 mg | ORAL_TABLET | Freq: Every day | ORAL | Status: DC
  Administered 2020-02-18: 20 mg via ORAL
  Filled 2020-02-18: qty 1

## 2020-02-18 MED ORDER — GLYCOPYRROLATE PF 0.2 MG/ML IJ SOSY
PREFILLED_SYRINGE | INTRAMUSCULAR | Status: AC
  Filled 2020-02-18: qty 4

## 2020-02-18 MED ORDER — CEFAZOLIN SODIUM-DEXTROSE 2-4 GM/100ML-% IV SOLN
2.0000 g | INTRAVENOUS | Status: AC
  Administered 2020-02-18: 2 g via INTRAVENOUS
  Filled 2020-02-18: qty 100

## 2020-02-18 MED ORDER — ONDANSETRON HCL 4 MG/2ML IJ SOLN
INTRAMUSCULAR | Status: AC
  Filled 2020-02-18: qty 2

## 2020-02-18 MED ORDER — LANTHANUM CARBONATE 500 MG PO CHEW
1000.0000 mg | CHEWABLE_TABLET | ORAL | Status: DC
  Filled 2020-02-18 (×6): qty 2

## 2020-02-18 MED ORDER — BUPIVACAINE LIPOSOME 1.3 % IJ SUSP
INTRAMUSCULAR | Status: AC
  Filled 2020-02-18: qty 20

## 2020-02-18 MED ORDER — NEOSTIGMINE METHYLSULFATE 3 MG/3ML IV SOSY
PREFILLED_SYRINGE | INTRAVENOUS | Status: AC
  Filled 2020-02-18: qty 6

## 2020-02-18 MED ORDER — EPHEDRINE SULFATE 50 MG/ML IJ SOLN
INTRAMUSCULAR | Status: DC | PRN
Start: 2020-02-18 — End: 2020-02-18
  Administered 2020-02-18 (×2): 10 mg via INTRAVENOUS

## 2020-02-18 MED ORDER — AMLODIPINE BESYLATE 5 MG PO TABS
10.0000 mg | ORAL_TABLET | Freq: Every day | ORAL | Status: DC
  Administered 2020-02-18: 10 mg via ORAL
  Filled 2020-02-18: qty 2

## 2020-02-18 MED ORDER — LANTHANUM CARBONATE 500 MG PO CHEW
2000.0000 mg | CHEWABLE_TABLET | Freq: Three times a day (TID) | ORAL | Status: DC
  Filled 2020-02-18 (×6): qty 4

## 2020-02-18 MED ORDER — CINACALCET HCL 30 MG PO TABS
30.0000 mg | ORAL_TABLET | Freq: Every day | ORAL | Status: DC
  Filled 2020-02-18: qty 1

## 2020-02-18 MED ORDER — LIDOCAINE HCL (PF) 1 % IJ SOLN: 5.0000 mL | INTRAMUSCULAR | Status: DC | PRN

## 2020-02-18 MED ORDER — PROPOFOL 10 MG/ML IV BOLUS
INTRAVENOUS | Status: DC | PRN
  Administered 2020-02-18: 75 ug/kg/min via INTRAVENOUS
  Administered 2020-02-18: 130 mg via INTRAVENOUS

## 2020-02-18 MED ORDER — MIDAZOLAM HCL 2 MG/2ML IJ SOLN
INTRAMUSCULAR | Status: AC
  Filled 2020-02-18: qty 2

## 2020-02-18 MED ORDER — LACTATED RINGERS IV SOLN: Freq: Once | INTRAVENOUS | Status: DC

## 2020-02-18 MED ORDER — POVIDONE-IODINE 10 % EX OINT
TOPICAL_OINTMENT | CUTANEOUS | Status: AC
  Filled 2020-02-18: qty 2

## 2020-02-18 MED ORDER — FENTANYL CITRATE (PF) 100 MCG/2ML IJ SOLN
INTRAMUSCULAR | Status: DC | PRN
  Administered 2020-02-18: 150 ug via INTRAVENOUS

## 2020-02-18 MED ORDER — PROPOFOL 10 MG/ML IV BOLUS
INTRAVENOUS | Status: AC
  Filled 2020-02-18: qty 60

## 2020-02-18 MED ORDER — MIDAZOLAM HCL 5 MG/5ML IJ SOLN
INTRAMUSCULAR | Status: DC | PRN
  Administered 2020-02-18: 2 mg via INTRAVENOUS

## 2020-02-18 MED ORDER — METOPROLOL TARTRATE 5 MG/5ML IV SOLN: 5.0000 mg | Freq: Four times a day (QID) | INTRAVENOUS | Status: DC | PRN

## 2020-02-18 SURGICAL SUPPLY — 49 items
APL PRP STRL LF DISP 70% ISPRP (MISCELLANEOUS) ×1
APPLIER CLIP 11 MED OPEN (CLIP) ×2
APR CLP MED 11 20 MLT OPN (CLIP) ×1
BINDER BREAST XLRG (GAUZE/BANDAGES/DRESSINGS) ×1 IMPLANT
CHLORAPREP W/TINT 26 (MISCELLANEOUS) ×2 IMPLANT
CLIP APPLIE 11 MED OPEN (CLIP) IMPLANT
CLOTH BEACON ORANGE TIMEOUT ST (SAFETY) ×2 IMPLANT
COVER LIGHT HANDLE STERIS (MISCELLANEOUS) ×4 IMPLANT
COVER WAND RF STERILE (DRAPES) ×2 IMPLANT
DRAPE HALF SHEET 40X57 (DRAPES) ×4 IMPLANT
ELECT REM PT RETURN 9FT ADLT (ELECTROSURGICAL) ×2
ELECTRODE REM PT RTRN 9FT ADLT (ELECTROSURGICAL) ×1 IMPLANT
EVACUATOR DRAINAGE 10X20 100CC (DRAIN) ×1 IMPLANT
EVACUATOR SILICONE 100CC (DRAIN) ×2
GAUZE SPONGE 4X4 12PLY STRL (GAUZE/BANDAGES/DRESSINGS) ×2 IMPLANT
GLOVE BIO SURGEON STRL SZ7.5 (GLOVE) ×1 IMPLANT
GLOVE BIOGEL PI IND STRL 7.0 (GLOVE) ×3 IMPLANT
GLOVE BIOGEL PI IND STRL 7.5 (GLOVE) IMPLANT
GLOVE BIOGEL PI INDICATOR 7.0 (GLOVE) ×3
GLOVE BIOGEL PI INDICATOR 7.5 (GLOVE) ×1
GLOVE SURG SS PI 7.5 STRL IVOR (GLOVE) ×2 IMPLANT
GOWN STRL REUS W/TWL LRG LVL3 (GOWN DISPOSABLE) ×6 IMPLANT
GOWN STRL REUS W/TWL XL LVL3 (GOWN DISPOSABLE) ×1 IMPLANT
INST SET MINOR GENERAL (KITS) ×2 IMPLANT
KIT TURNOVER KIT A (KITS) ×2 IMPLANT
MANIFOLD NEPTUNE II (INSTRUMENTS) ×2 IMPLANT
NDL HYPO 18GX1.5 BLUNT FILL (NEEDLE) ×1 IMPLANT
NEEDLE HYPO 18GX1.5 BLUNT FILL (NEEDLE) ×2 IMPLANT
NEEDLE HYPO 22GX1.5 SAFETY (NEEDLE) ×2 IMPLANT
NS IRRIG 1000ML POUR BTL (IV SOLUTION) ×2 IMPLANT
PACK MINOR (CUSTOM PROCEDURE TRAY) ×1 IMPLANT
PAD ABD 8X10 STRL (GAUZE/BANDAGES/DRESSINGS) ×2 IMPLANT
PAD ARMBOARD 7.5X6 YLW CONV (MISCELLANEOUS) ×2 IMPLANT
PENCIL SMOKE EVACUATOR (MISCELLANEOUS) ×1 IMPLANT
SET BASIN LINEN APH (SET/KITS/TRAYS/PACK) ×2 IMPLANT
SPONGE DRAIN TRACH 4X4 STRL 2S (GAUZE/BANDAGES/DRESSINGS) ×2 IMPLANT
SPONGE INTESTINAL PEANUT (DISPOSABLE) ×2 IMPLANT
SPONGE LAP 18X18 RF (DISPOSABLE) ×4 IMPLANT
STAPLER VISISTAT (STAPLE) ×3 IMPLANT
SUT ETHILON 3 0 FSL (SUTURE) ×2 IMPLANT
SUT SILK 2 0 (SUTURE) ×2
SUT SILK 2 0 SH (SUTURE) ×2 IMPLANT
SUT SILK 2-0 18XBRD TIE 12 (SUTURE) ×1 IMPLANT
SUT VIC AB 2-0 CT1 27 (SUTURE) ×10
SUT VIC AB 2-0 CT1 TAPERPNT 27 (SUTURE) ×4 IMPLANT
SUT VIC AB 3-0 SH 27 (SUTURE) ×2
SUT VIC AB 3-0 SH 27X BRD (SUTURE) ×1 IMPLANT
SYR 20ML LL LF (SYRINGE) ×4 IMPLANT
SYR BULB IRRIG 60ML STRL (SYRINGE) ×2 IMPLANT

## 2020-02-18 NOTE — H&P (Addendum)
History and Physical  [facility]  Megan [name] WRU:[id number] DOB: 07/01/1961 DOA: 02/18/2020  PCP: [name], MD  Patient coming from: Home   I have personally briefly reviewed patient's old medical records in [place]  Chief Complaint: breast cancer   HPI: Megan [name] is a 59 y.o. female with medical history significant for ESRD on HD MWF who had HD Monday on schedule presented today for a scheduled right modified radical mastectomy with Dr. Arnoldo Morale and Dr. Constance Haw.  She was referred to Dr. Arnoldo Morale by Dr. Delton Coombes as part of her treatment plan for recurrent right breast cancer.  She had a partial right mastectomy in Feb 2020 for invasive ductal ER positive carcinoma.  Her follow up mammograms revealed a new high grade ductal carcinoma of the upper inner quadrant of the right breast and she was sent for operative management.  She had her surgery on 02/18/20 and tolerated it well. She is being observed in the hospital overnight for medical and surgical follow up.  She will be scheduled for her regular hemodialysis 02/19/20.    Review of Systems: UTO as patient is somnolent sedated postop.    Past Medical History:  Diagnosis Date  . (HFpEF) heart failure with preserved ejection fraction ([place])    a. 01/2019 Echo: EF 55-60%, mild conc LVH. DD.  Torn MV chordae.  . Anemia   . Atypical chest pain    a. 08/2018 MV: EF 59%, no ischemia; b. 02/2019 Cath: nonobs dzs.  . Blood transfusion without reported diagnosis   . Cataract   . ESRD (end stage renal disease) on dialysis ([place])    a. HD T, T, S  . Essential hypertension, benign   . GERD (gastroesophageal reflux disease)   . Headache   . Hemorrhoids   . Mixed hyperlipidemia   . Morbid obesity ([place])   . Non-obstructive CAD (coronary artery disease)    a. 02/2019 CathL LM nl, LAD 81m, LCX nl, RCA 25p, EF 55-65%.  Marland Kitchen PONV (postoperative nausea and vomiting)   . S/P colonoscopy Jan 2011   Dr. Benson Norway: sessile polyp  (benign lymphoid), large hemorrhoids, repeat 5-10 years  . Temporal arteritis ([place])   . Type 2 diabetes mellitus ([place])   . Wears glasses     Past Surgical History:  Procedure Laterality Date  . ABDOMINAL HYSTERECTOMY    . APPENDECTOMY    . ARTERY BIOPSY N/A 05/09/2018   Procedure: RIGHT TEMPORAL ARTERY BIOPSY;  Surgeon: Judeth Horn, MD;  Location: [place];  Service: General;  Laterality: N/A;  . CATARACT EXTRACTION W/PHACO Left 02/09/2017   Procedure: CATARACT EXTRACTION PHACO AND INTRAOCULAR LENS PLACEMENT LEFT EYE;  Surgeon: Tonny Branch, MD;  [facility];  Service: Ophthalmology;  Laterality: Left;  CDE: 4.89  . CATARACT EXTRACTION W/PHACO Right 06/04/2017   Procedure: CATARACT EXTRACTION PHACO AND INTRAOCULAR LENS PLACEMENT (IOC);  Surgeon: Tonny Branch, MD;  [facility];  Service: Ophthalmology;  Laterality: Right;  CDE: 4.12  . CHOLECYSTECTOMY  09/29/2011   Procedure: LAPAROSCOPIC CHOLECYSTECTOMY;  Surgeon: Jamesetta So, MD;  [facility];  Service: General;  Laterality: N/A;  . COLONOSCOPY  Jan 2011   Dr. Benson Norway: sessile polyp (benign lymphoid), large hemorrhoids, repeat 5-10 years  . COLONOSCOPY N/A 06/12/2016   Procedure: COLONOSCOPY;  Surgeon: Daneil Dolin, MD;  [facility];  Service: Endoscopy;  Laterality: N/A;  1230   . ESOPHAGOGASTRODUODENOSCOPY  09/05/2011   BJY:NWGNF hiatal hernia; remainder of  exam normal. No explanation for patient's abdominal pain with today's examination  . ESOPHAGOGASTRODUODENOSCOPY N/A 12/17/2013   Dr. [name]: gastric erythema, erosion, mild chronic inflammation on path   . LAPAROSCOPIC APPENDECTOMY  09/29/2011   Procedure: APPENDECTOMY LAPAROSCOPIC;  Surgeon: Jamesetta So, MD;  [facility];  Service: General;;  incidental appendectomy  . LEFT HEART CATH AND CORONARY ANGIOGRAPHY N/A 02/28/2019   Procedure: LEFT HEART CATH AND CORONARY ANGIOGRAPHY;  Surgeon: Jettie Booze, MD;  Location: [facility];  Service:  Cardiovascular;  Laterality: N/A;  . PARTIAL MASTECTOMY WITH NEEDLE LOCALIZATION AND AXILLARY SENTINEL LYMPH NODE BX Right 09/18/2018   Procedure: RIGHT PARTIAL MASTECTOMY AFTER NEEDLE LOCALIZATION, SENTINEL LYMPH NODE BIOPSY RIGHT AXILLA;  Surgeon: Aviva Signs, MD;  [facility];  Service: General;  Laterality: Right;     reports that she has never smoked. She has never used smokeless tobacco. She reports that she does not drink alcohol and does not use drugs.  Allergies  Allergen Reactions  . Hydrocodone Itching    Family History  Problem Relation Age of Onset  . Hypertension Mother   . Coronary artery disease Mother   . Diabetes Mother   . Hypertension Sister   . Coronary artery disease Sister   . Hypertension Brother   . Heart attack Father   . Hypertension Son   . Heart attack Maternal Aunt   . Hypertension Maternal Aunt   . Diabetes Maternal Aunt   . Heart attack Maternal Uncle   . Hypertension Maternal Uncle   . Diabetes Maternal Uncle   . Heart attack Paternal Aunt   . Hypertension Paternal Aunt   . Diabetes Paternal Aunt   . Heart attack Paternal Uncle   . Hypertension Paternal Uncle   . Diabetes Paternal Uncle   . Heart attack Maternal Grandmother   . Heart attack Maternal Grandfather   . Heart attack Paternal Grandmother   . Heart attack Paternal Grandfather   . Colon cancer Neg Hx      Prior to Admission medications   Medication Sig Start Date End Date Taking? Authorizing Provider  amLODipine (NORVASC) 10 MG tablet TAKE 2 TABLETS(20 MG) BY MOUTH AT BEDTIME Patient taking differently: Take 10 mg by mouth at bedtime.  01/15/20  Yes [name], MD  anastrozole (ARIMIDEX) 1 MG tablet TAKE 1 TABLET(1 MG) BY MOUTH DAILY Patient taking differently: Take 1 mg by mouth daily.  11/26/19  Yes Lockamy, Randi L, NP-C  aspirin EC 81 MG tablet Take 81 mg by mouth daily.   Yes [provider]  atorvastatin (LIPITOR) 20 MG tablet TAKE 1 TABLET  DAILY Patient taking differently: Take 20 mg by mouth daily.  05/13/19  Yes [name], MD  B Complex-C-Zn-Folic Acid (DIALYVITE 809-XIPJ 15) 0.8 MG TABS Take 1 tablet by mouth daily. 12/10/19  Yes [provider]  cinacalcet (SENSIPAR) 30 MG tablet Take 2 tablets (60 mg total) by mouth daily. Patient taking differently: Take 30 mg by mouth daily.  06/13/18  Yes [name], MD  DULoxetine (CYMBALTA) 30 MG capsule TAKE 1 CAPSULE(30 MG) BY MOUTH DAILY 02/05/20  Yes [name], MD  gabapentin (NEURONTIN) 300 MG capsule TAKE 1 CAPSULE(300 MG) BY MOUTH AT BEDTIME Patient taking differently: Take 300 mg by mouth at bedtime.  06/16/19  Yes [name], MD  lanthanum (FOSRENOL) 1000 MG chewable tablet Chew 1,000-2,000 mg by mouth See admin instructions. Take 2000 mg by mouth with meals and  1000 mg with snacks 10/16/19  Yes [provider]  lidocaine-prilocaine (EMLA) cream Apply 1 application topically every Monday, Wednesday, and Friday with hemodialysis.  09/24/18  Yes [provider]  nebivolol (BYSTOLIC) 10 MG tablet Take 1 tablet (10 mg total) by mouth daily. Patient taking differently: Take 10 mg by mouth in the morning and at bedtime.  10/15/19  Yes [name], MD  pantoprazole (PROTONIX) 40 MG tablet TAKE 1 TABLET(40 MG) BY MOUTH DAILY Patient taking differently: Take 40 mg by mouth daily.  06/02/19  Yes Roger Shelter, FNP  Heparin Sod, Porcine, in D5W (HEPARIN SODIUM/D5W IV) Heparin Sodium (Porcine) 1,000 Units/mL Systemic Patient not taking: Reported on 02/04/2020 04/01/19 03/30/20  [provider]  losartan (COZAAR) 50 MG tablet TAKE 1 TABLET DAILY (DISCONTINUE LOSARTAN 100 MG) Patient not taking: Reported on 02/04/2020 03/10/19   [name], MD  tiZANidine (ZANAFLEX) 4 MG capsule TAKE 1 CAPSULE(4 MG) BY MOUTH TWICE DAILY WITH A MEAL. START 1 CAPSULE AT NIGHT FOR 2 WEEKS AND THEN TWICE DAILY Patient not taking: Reported on 02/04/2020  01/14/19   Garvin Fila, MD    Physical Exam: Vitals:   02/18/20 0729 02/18/20 1033  BP: 140/73 128/66  Pulse: 75 78  Resp: 18 (!) 24  Temp: 97.9 F (36.6 C) (!) 97.5 F (36.4 C)  TempSrc: Oral   SpO2: 95% 100%  Weight: 80.3 kg   Height: 5\' 2"  (1.575 m)     Constitutional: Pt is sedated, extubated, NAD, calm, comfortable Eyes: PERRL, lids and conjunctivae normal ENMT: no gross abnormalities.   Neck: normal, supple, no masses, no thyromegaly Respiratory: clear to auscultation bilaterally, no wheezing, no crackles. Normal respiratory effort. No accessory muscle use.  Cardiovascular: Regular rate and rhythm, no murmurs / rubs / gallops. No extremity edema. 2+ pedal pulses. No carotid bruits.  Abdomen: no tenderness, no masses palpated. No hepatosplenomegaly. Bowel sounds positive.  Musculoskeletal: no clubbing / cyanosis. No joint deformity upper and lower extremities. Good ROM, no contractures. Normal muscle tone.  Skin: no rashes, lesions, ulcers. No induration Neurologic: CN 2-12 grossly intact. Sensation intact, DTR normal. Strength 5/5 in all 4.  Psychiatric: Unable to assess.    Labs on Admission: I have personally reviewed following labs and imaging studies  CBC: Recent Labs  Lab 02/17/20 0840  WBC 8.7  NEUTROABS 5.5  HGB 10.6*  HCT 34.1*  MCV 103.6*  PLT 854   Basic Metabolic Panel: Recent Labs  Lab 02/17/20 0840  NA 141  K 3.4*  CL 97*  CO2 25  GLUCOSE 106*  BUN 58*  CREATININE 9.26*  CALCIUM 9.8   GFR: Estimated Creatinine Clearance: 6.6 mL/min (A) (by C-G formula based on SCr of 9.26 mg/dL (H)). Liver Function Tests: Recent Labs  Lab 02/17/20 0840  AST 23  ALT 23  ALKPHOS 77  BILITOT 0.6  PROT 7.7  ALBUMIN 3.7   No results for input(s): LIPASE, AMYLASE in the last 168 hours. No results for input(s): AMMONIA in the last 168 hours. Coagulation Profile: No results for input(s): INR, PROTIME in the last 168 hours. Cardiac Enzymes: No  results for input(s): CKTOTAL, CKMB, CKMBINDEX, TROPONINI in the last 168 hours. BNP (last 3 results) No results for input(s): PROBNP in the last 8760 hours. HbA1C: No results for input(s): HGBA1C in the last 72 hours. CBG: No results for input(s): GLUCAP in the last 168 hours. Lipid Profile: No results for input(s): CHOL, HDL, LDLCALC, TRIG, CHOLHDL, LDLDIRECT  in the last 72 hours. Thyroid Function Tests: No results for input(s): TSH, T4TOTAL, FREET4, T3FREE, THYROIDAB in the last 72 hours. Anemia Panel: No results for input(s): VITAMINB12, FOLATE, FERRITIN, TIBC, IRON, RETICCTPCT in the last 72 hours. Urine analysis:    Component Value Date/Time   COLORURINE YELLOW 10/03/2016 0704   APPEARANCEUR CLEAR 10/03/2016 0704   LABSPEC 1.010 10/03/2016 0704   PHURINE 6.0 10/03/2016 0704   GLUCOSEU 50 (A) 10/03/2016 0704   HGBUR NEGATIVE 10/03/2016 0704   BILIRUBINUR NEGATIVE 10/03/2016 0704   KETONESUR NEGATIVE 10/03/2016 0704   PROTEINUR 100 (A) 10/03/2016 0704   UROBILINOGEN 0.2 08/19/2011 1103   NITRITE NEGATIVE 10/03/2016 0704   LEUKOCYTESUR NEGATIVE 10/03/2016 0704    Radiological Exams on Admission: Chest 2 View  Result Date: 02/17/2020 CLINICAL DATA:  Preop for breast cancer surgery. EXAM: CHEST - 2 VIEW COMPARISON:  February 28, 2019. FINDINGS: The heart size and mediastinal contours are within normal limits. No pneumothorax or pleural effusion is noted. Right lung is clear. Minimal lingular subsegmental atelectasis or scarring is noted. The visualized skeletal structures are unremarkable. IMPRESSION: Minimal lingular subsegmental atelectasis or scarring. Electronically Signed   By: Marijo Conception M.D.   On: 02/17/2020 09:25   Assessment/Plan Active Problems:   GERD (gastroesophageal reflux disease)   Type 2 diabetes mellitus (HCC)   Hypertensive heart disease with heart failure (HCC)   Anemia in chronic kidney disease   Hypertensive heart and chronic kidney disease without  heart failure, with stage 5 chronic kidney disease, or end stage renal disease ([place])   Unspecified protein-calorie malnutrition ([place])   Breast cancer ([place])   History of modified radical mastectomy of right breast   ESRD (end stage renal disease) on dialysis ([place])   1. Postop day #0 s/p right radical mastectomy - I spoke with Dr. Arnoldo Morale and he will consult. Continue postop orders per surgery.   2. ESRD on HD - I spoke with nephrologist Dr. Royce Macadamia about admission and need for HD treatment.   3. Breast cancer - Pt is being managed by Dr. Delton Coombes.  4. Anemia in CKD - stable, follow hemoglobin, recheck in AM.  5. Essential hypertension - BP well controlled, follow.  Reconcile home meds and resume.  6. GERD - stable follow.    DVT prophylaxis: SQ heparin  Code Status: Full  Family Communication:   Disposition Plan: home   Consults called: surgery   Admission status: OBS    Anthonella Klausner MD [facility] How to contact the [facility] Attending or Consulting provider [place] or covering provider during after hours [place], for this patient?  1. Check the care team in [facility] and look for a) attending/consulting [facility] provider listed and b) the [facility] team listed 2. Log into [email or web address] and use [place]'s universal password to access. If you do not have the password, please contact the hospital operator. 3. Locate the [facility] provider you are looking for under [facility] and page to a number that you can be directly reached. 4. If you still have difficulty reaching the provider, please page the [facility] (Director on Call) for the Hospitalists listed on amion for assistance.   If 7PM-7AM, please contact night-coverage [email or web address] Password [facility]  02/18/2020, 10:48 AM

## 2020-02-18 NOTE — Anesthesia Procedure Notes (Signed)
Procedure Name: Intubation Performed by: Tacy Learn, CRNA Pre-anesthesia Checklist: Patient identified, Emergency Drugs available, Suction available, Patient being monitored and Timeout performed Patient Re-evaluated:Patient Re-evaluated prior to induction Oxygen Delivery Method: Circle system utilized Preoxygenation: Pre-oxygenation with 100% oxygen Induction Type: IV induction Laryngoscope Size: Miller and 2 Grade View: Grade I Tube type: Oral Tube size: 7.0 mm Number of attempts: 1 Airway Equipment and Method: Stylet Placement Confirmation: ETT inserted through vocal cords under direct vision,  positive ETCO2 and breath sounds checked- equal and bilateral Secured at: 21 cm Tube secured with: Tape Dental Injury: Teeth and Oropharynx as per pre-operative assessment

## 2020-02-18 NOTE — Procedures (Signed)
HEMODIALYSIS TREATMENT NOTE:   Uneventful 3.5 hour heparin-free HD completed via left forearm AVF (15g/antegrade). Goal met: 2 liters removed without interruption in UF.  All blood was returned and hemostasis was achieved in 15 minutes.  No changes from pre-HD assessment.   , RN 

## 2020-02-18 NOTE — Transfer of Care (Signed)
Immediate Anesthesia Transfer of Care Note  Patient: Megan [name]  Procedure(s) Performed: MASTECTOMY MODIFIED RADICAL (Right Breast)  Patient Location: PACU  Anesthesia Type:General  Level of Consciousness: awake, alert , oriented, drowsy and patient cooperative  Airway & Oxygen Therapy: Patient Spontanous Breathing and Patient connected to nasal cannula oxygen  Post-op Assessment: Report given to RN, Post -op Vital signs reviewed and stable and Patient moving all extremities  Post vital signs: Reviewed and stable  Last Vitals:  Vitals Value Taken Time  BP 128/66 02/18/20 1033  Temp    Pulse 78 02/18/20 1034  Resp 19 02/18/20 1034  SpO2 100 % 02/18/20 1034  Vitals shown include unvalidated device data.  Last Pain:  Vitals:   02/18/20 0729  TempSrc: Oral  PainSc: 0-No pain      Patients Stated Pain Goal: 5 (15/05/69 7948)  Complications: No complications documented.

## 2020-02-18 NOTE — Interval H&P Note (Signed)
History and Physical Interval Note:  02/18/2020 8:30 AM  Megan [name]  has presented today for surgery, with the diagnosis of Right Breast Cancer.  The various methods of treatment have been discussed with the patient and family. After consideration of risks, benefits and other options for treatment, the patient has consented to  Procedure(s): MASTECTOMY MODIFIED RADICAL (Right) as a surgical intervention.  The patient's history has been reviewed, patient examined, no change in status, stable for surgery.  I have reviewed the patient's chart and labs.  Questions were answered to the patient's satisfaction.     Aviva Signs

## 2020-02-18 NOTE — Op Note (Signed)
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  She now presents for right modified radical mastectomy.  The risks and benefits of the procedure including bleeding, infection, cardiopulmonary difficulties, and the possibility of needing a blood transfusion were fully explained to the patient, who gave informed consent.  Procedure note: The patient was placed in the supine position.  After induction of general endotracheal anesthesia, the right breast and axilla were prepped and draped using the usual sterile technique with DuraPrep.  Surgical site confirmation was performed.  An elliptical incision was made around the nipple.  A superior flap was formed to the clavicle and an inferior flap formed to the chest wall.  The breast was then removed in total medial to lateral off the pectoralis major muscle using Bovie electrocautery.  A suture was placed superiorly for orientation purposes.  The specimen was sent to pathology for further examination.  In the upper, outer quadrant of the right breast, the previous mastectomy site was excised to normal looking tissue.  I then proceeded to do a level 2 axillary dissection.  She had had a previous sentinel lymph node biopsy.  I was able to find 2 lymph nodes within the adipose tissue removed.  Care was taken to avoid the neurovascular structures.  A bleeding was controlled using clips.  The right axillary contents were sent to pathology for further  examination.  #10 flat Jackson-Pratt drain was placed along the flap and in the right axilla.  It was secured at the skin level using a 3-0 nylon interrupted suture.  The wound was irrigated with normal saline.  The subcutaneous layer was reapproximated using 2-0 Vicryl interrupted sutures.  The skin was closed using staples.  Betadine ointment and a dry sterile dressing were applied.  All tape and needle counts were correct at the end of the procedure.  The patient was extubated in the operating room and transferred to PACU in stable condition.  Complications: None  EBL: 25 cc  Specimen: Right breast, right axillary contents  Drains: Jackson-Pratt drain to right mastectomy flap and axilla

## 2020-02-18 NOTE — Plan of Care (Signed)

## 2020-02-18 NOTE — Anesthesia Postprocedure Evaluation (Signed)
Anesthesia Post Note  Patient: Megan [name]  Procedure(s) Performed: MASTECTOMY MODIFIED RADICAL (Right Breast)  Patient location during evaluation: PACU Anesthesia Type: General Level of consciousness: awake, oriented, awake and alert and patient cooperative Pain management: pain level controlled Respiratory status: spontaneous breathing, respiratory function stable, nonlabored ventilation and patient connected to nasal cannula oxygen Cardiovascular status: blood pressure returned to baseline and stable Postop Assessment: no headache and no backache Anesthetic complications: no   No complications documented.   Last Vitals:  Vitals:   02/18/20 0729  BP: 140/73  Pulse: 75  Resp: 18  Temp: 36.6 C  SpO2: 95%    Last Pain:  Vitals:   02/18/20 0729  TempSrc: Oral  PainSc: 0-No pain                 Tacy Learn

## 2020-02-18 NOTE — Anesthesia Preprocedure Evaluation (Signed)
Anesthesia Evaluation  Patient identified by MRN, date of birth, ID band Patient awake    Reviewed: Allergy & Precautions, NPO status , Patient's Chart, lab work & pertinent test results  History of Anesthesia Complications (+) PONV and history of anesthetic complications  Airway Mallampati: II  TM Distance: >3 FB Neck ROM: Full    Dental  (+) Dental Advisory Given, Teeth Intact   Pulmonary shortness of breath and with exertion,    Pulmonary exam normal breath sounds clear to auscultation       Cardiovascular Exercise Tolerance: Good hypertension, Pt. on medications + CAD and +CHF  Normal cardiovascular exam Rhythm:Regular Rate:Normal     Neuro/Psych  Headaches, negative psych ROS   GI/Hepatic GERD  Medicated and Controlled,  Endo/Other  diabetes, Well Controlled, Type 2Hypothyroidism   Renal/GU DialysisRenal disease (last dialysis - 02/16/20 potassium - 3.4 on 02/17/20)  negative genitourinary   Musculoskeletal negative musculoskeletal ROS (+)   Abdominal   Peds  Hematology  (+) anemia ,   Anesthesia Other Findings   Reproductive/Obstetrics negative OB ROS                             Anesthesia Physical Anesthesia Plan  ASA: III  Anesthesia Plan: General   Post-op Pain Management:    Induction: Intravenous  PONV Risk Score and Plan: 4 or greater and Ondansetron, Dexamethasone, Midazolam and Scopolamine patch - Pre-op  Airway Management Planned: Oral ETT  Additional Equipment:   Intra-op Plan:   Post-operative Plan: Extubation in OR  Informed Consent: I have reviewed the patients History and Physical, chart, labs and discussed the procedure including the risks, benefits and alternatives for the proposed anesthesia with the patient or authorized representative who has indicated his/her understanding and acceptance.     Dental advisory given  Plan Discussed with: CRNA  and Surgeon  Anesthesia Plan Comments:         Anesthesia Quick Evaluation

## 2020-02-19 ENCOUNTER — Encounter (HOSPITAL_COMMUNITY): Payer: Self-pay | Admitting: General Surgery

## 2020-02-19 DIAGNOSIS — E1122 Type 2 diabetes mellitus with diabetic chronic kidney disease: Secondary | ICD-10-CM | POA: Diagnosis not present

## 2020-02-19 DIAGNOSIS — D631 Anemia in chronic kidney disease: Secondary | ICD-10-CM | POA: Diagnosis not present

## 2020-02-19 DIAGNOSIS — N186 End stage renal disease: Secondary | ICD-10-CM | POA: Diagnosis not present

## 2020-02-19 DIAGNOSIS — I5030 Unspecified diastolic (congestive) heart failure: Secondary | ICD-10-CM | POA: Diagnosis not present

## 2020-02-19 DIAGNOSIS — I132 Hypertensive heart and chronic kidney disease with heart failure and with stage 5 chronic kidney disease, or end stage renal disease: Secondary | ICD-10-CM | POA: Diagnosis not present

## 2020-02-19 DIAGNOSIS — C50911 Malignant neoplasm of unspecified site of right female breast: Secondary | ICD-10-CM | POA: Diagnosis not present

## 2020-02-19 LAB — CBC WITH DIFFERENTIAL/PLATELET
Abs Immature Granulocytes: 0.05 10*3/uL (ref 0.00–0.07)
Basophils Absolute: 0 10*3/uL (ref 0.0–0.1)
Basophils Relative: 0 %
Eosinophils Absolute: 0 10*3/uL (ref 0.0–0.5)
Eosinophils Relative: 0 %
HCT: 28.8 % — ABNORMAL LOW (ref 36.0–46.0)
Hemoglobin: 9.3 g/dL — ABNORMAL LOW (ref 12.0–15.0)
Immature Granulocytes: 1 %
Lymphocytes Relative: 11 %
Lymphs Abs: 1.2 10*3/uL (ref 0.7–4.0)
MCH: 32.7 pg (ref 26.0–34.0)
MCHC: 32.3 g/dL (ref 30.0–36.0)
MCV: 101.4 fL — ABNORMAL HIGH (ref 80.0–100.0)
Monocytes Absolute: 1.1 10*3/uL — ABNORMAL HIGH (ref 0.1–1.0)
Monocytes Relative: 10 %
Neutro Abs: 8.5 10*3/uL — ABNORMAL HIGH (ref 1.7–7.7)
Neutrophils Relative %: 78 %
Platelets: 281 10*3/uL (ref 150–400)
RBC: 2.84 MIL/uL — ABNORMAL LOW (ref 3.87–5.11)
RDW: 15.1 % (ref 11.5–15.5)
WBC: 10.9 10*3/uL — ABNORMAL HIGH (ref 4.0–10.5)
nRBC: 0 % (ref 0.0–0.2)

## 2020-02-19 LAB — RENAL FUNCTION PANEL
Albumin: 3.2 g/dL — ABNORMAL LOW (ref 3.5–5.0)
Anion gap: 15 (ref 5–15)
BUN: 32 mg/dL — ABNORMAL HIGH (ref 6–20)
CO2: 26 mmol/L (ref 22–32)
Calcium: 8.3 mg/dL — ABNORMAL LOW (ref 8.9–10.3)
Chloride: 95 mmol/L — ABNORMAL LOW (ref 98–111)
Creatinine, Ser: 6.17 mg/dL — ABNORMAL HIGH (ref 0.44–1.00)
GFR calc Af Amer: 8 mL/min — ABNORMAL LOW (ref >60)
GFR calc non Af Amer: 7 mL/min — ABNORMAL LOW (ref >60)
Glucose, Bld: 109 mg/dL — ABNORMAL HIGH (ref 70–99)
Phosphorus: 4.6 mg/dL (ref 2.5–4.6)
Potassium: 3.5 mmol/L (ref 3.5–5.1)
Sodium: 136 mmol/L (ref 135–145)

## 2020-02-19 LAB — GLUCOSE, CAPILLARY
Glucose-Capillary: 106 mg/dL — ABNORMAL HIGH (ref 70–99)
Glucose-Capillary: 148 mg/dL — ABNORMAL HIGH (ref 70–99)

## 2020-02-19 MED ORDER — OXYCODONE HCL 5 MG PO TABS: 5.0000 mg | ORAL_TABLET | Freq: Four times a day (QID) | ORAL | 0 refills | Status: DC | PRN

## 2020-02-19 NOTE — Evaluation (Signed)
Physical Therapy Evaluation Patient Details Name: Megan [name] MRN: [id number] DOB: 1961-07-29 Today's Date: 02/19/2020   History of Present Illness  Megan [name] is a 59 y.o. female with medical history significant for ESRD on HD MWF who had HD Monday on schedule presented today for a scheduled right modified radical mastectomy with Dr. Arnoldo Morale and Dr. Constance Haw.  She was referred to Dr. Arnoldo Morale by Dr. Delton Coombes as part of her treatment plan for recurrent right breast cancer.  She had a partial right mastectomy in Feb 2020 for invasive ductal ER positive carcinoma.  Her follow up mammograms revealed a new high grade ductal carcinoma of the upper inner quadrant of the right breast and she was sent for operative management.  She had her surgery on 02/18/20 and tolerated it well. She is being observed in the hospital overnight for medical and surgical follow up.  She will be scheduled for her regular hemodialysis 02/19/20    Clinical Impression  Patient instructed in post op mastectomy exercises with good return demonstrated and understanding acknowledged. Patient demonstrates slightly labored movement for sitting up at bedside and ambulating in room/hallway with mostly supervision without loss of balance.  Plan: Patient to be discharged home today and discharged from physical therapy to care of nursing for out of bed and ambulation as tolerated for length of stay.    Follow Up Recommendations No PT follow up;Supervision for mobility/OOB;Supervision - Intermittent    Equipment Recommendations  None recommended by PT    Recommendations for Other Services       Precautions / Restrictions Precautions Precautions: None Restrictions Weight Bearing Restrictions: No      Mobility  Bed Mobility Overal bed mobility: Needs Assistance Bed Mobility: Supine to Sit     Supine to sit: Min guard     General bed mobility comments: increased time, labored movement  Transfers Overall  transfer level: Needs assistance Equipment used: None;1 person hand held assist Transfers: Sit to/from Stand;Stand Pivot Transfers Sit to Stand: Supervision Stand pivot transfers: Supervision          Ambulation/Gait Ambulation/Gait assistance: Supervision Gait Distance (Feet): 75 Feet Assistive device: None;1 person hand held assist Gait Pattern/deviations: Decreased step length - right;Decreased step length - left;Decreased stride length Gait velocity: decreased   General Gait Details: slightly labored cadence without loss of balance, limited secondary to fatigue  Stairs            Wheelchair Mobility    Modified Rankin (Stroke Patients Only)       Balance Overall balance assessment: No apparent balance deficits (not formally assessed)                                           Pertinent Vitals/Pain Pain Assessment: No/denies pain    Home Living Family/patient expects to be discharged to:: Private residence Living Arrangements: Spouse/significant other Available Help at Discharge: Family;Available 24 hours/day Type of Home: House Home Access: Stairs to enter Entrance Stairs-Rails: None Entrance Stairs-Number of Steps: 1 Home Layout: One level Home Equipment: None      Prior Function Level of Independence: Independent         Comments: household and short distanced community ambulator without AD     Hand Dominance        Extremity/Trunk Assessment   Upper Extremity Assessment Upper Extremity Assessment: Overall WFL for tasks assessed;RUE deficits/detail RUE Deficits /  Details: grossly 4+/5 RUE: Unable to fully assess due to pain (at end range shoulder flexion) RUE Sensation: WNL RUE Coordination: WNL         Cervical / Trunk Assessment Cervical / Trunk Assessment: Normal  Communication   Communication: No difficulties  Cognition Arousal/Alertness: Awake/alert Behavior During Therapy: WFL for tasks  assessed/performed Overall Cognitive Status: Within Functional Limits for tasks assessed                                        General Comments      Exercises     Assessment/Plan    PT Assessment Patent does not need any further PT services  PT Problem List         PT Treatment Interventions      PT Goals (Current goals can be found in the Care Plan section)  Acute Rehab PT Goals Patient Stated Goal: return home with family to assist PT Goal Formulation: With patient/family Time For Goal Achievement: 02/19/20 Potential to Achieve Goals: Good    Frequency     Barriers to discharge        Co-evaluation               AM-PAC PT "6 Clicks" Mobility  Outcome Measure Help needed turning from your back to your side while in a flat bed without using bedrails?: None Help needed moving from lying on your back to sitting on the side of a flat bed without using bedrails?: A Little Help needed moving to and from a bed to a chair (including a wheelchair)?: A Little Help needed standing up from a chair using your arms (e.g., wheelchair or bedside chair)?: None Help needed to walk in hospital room?: A Little Help needed climbing 3-5 steps with a railing? : A Little 6 Click Score: 20    End of Session   Activity Tolerance: Patient tolerated treatment well;Patient limited by fatigue Patient left: with call bell/phone within reach;with family/visitor present (in bathroom with spouse assisting) Nurse Communication: Mobility status PT Visit Diagnosis: Unsteadiness on feet (R26.81);Other abnormalities of gait and mobility (R26.89);Muscle weakness (generalized) (M62.81)    Time: 5397-6734 PT Time Calculation (min) (ACUTE ONLY): 23 min   Charges:   PT Evaluation $PT Eval Moderate Complexity: 1 Mod PT Treatments $Therapeutic Activity: 23-37 mins        10:26 AM, 02/19/20 Lonell Grandchild, MPT Physical Therapist with [facility] [phone]  [phone] office [phone] mobile phone

## 2020-02-19 NOTE — Discharge Instructions (Signed)
Surgical Drain Home Care Surgical drains are used to remove extra fluid that normally builds up in a surgical wound after surgery. A surgical drain helps to heal a surgical wound. Different kinds of surgical drains include:  Active drains. These drains use suction to pull drainage away from the surgical wound. Drainage flows through a tube to a container outside of the body. With these drains, you need to keep the bulb or the drainage container flat (compressed) at all times, except while you empty it. Flattening the bulb or container creates suction.  Passive drains. These drains allow fluid to drain naturally, by gravity. Drainage flows through a tube to a bandage (dressing) or a container outside of the body. Passive drains do not need to be emptied. A drain is placed during surgery. Right after surgery, drainage is usually bright red and a little thicker than water. The drainage may gradually turn yellow or pink and become thinner. It is likely that your health care provider will remove the drain when the drainage stops or when the amount decreases to 1-2 Tbsp (15-30 mL) during a 24-hour period. Supplies needed:  Tape.  Germ-free cleaning solution (sterile saline).  Cotton swabs.  Split gauze drain sponge: 4 x 4 inches (10 x 10 cm).  Gauze square: 4 x 4 inches (10 x 10 cm). How to care for your surgical drain Care for your drain as told by your health care provider. This is important to help prevent infection. If your drain is placed at your back, or any other hard-to-reach area, ask another person to assist you in performing the following tasks: General care  Keep the skin around the drain dry and covered with a dressing at all times.  Check your drain area every day for signs of infection. Check for: ? Redness, swelling, or pain. ? Pus or a bad smell. ? Cloudy drainage. ? Tenderness or pressure at the drain exit site. Changing the dressing Follow instructions from your health care  provider about how to change your dressing. Change your dressing at least once a day. Change it more often if needed to keep the dressing dry. Make sure you: 1. Gather your supplies. 2. Wash your hands with soap and water before you change your dressing. If soap and water are not available, use hand sanitizer. 3. Remove the old dressing. Avoid using scissors to do that. 4. Wash your hands with soap and water again after removing the old dressing. 5. Use sterile saline to clean your skin around the drain. You may need to use a cotton swab to clean the skin. 6. Place the tube through the slit in a drain sponge. Place the drain sponge so that it covers your wound. 7. Place the gauze square or another drain sponge on top of the drain sponge that is on the wound. Make sure the tube is between those layers. 8. Tape the dressing to your skin. 9. Tape the drainage tube to your skin 1-2 inches (2.5-5 cm) below the place where the tube enters your body. Taping keeps the tube from pulling on any stitches (sutures) that you have. 10. Wash your hands with soap and water. 11. Write down the color of your drainage and how often you change your dressing. How to empty your active drain  1. Make sure that you have a measuring cup that you can empty your drainage into. 2. Wash your hands with soap and water. If soap and water are not available, use hand sanitizer. 3.   Loosen any pins or clips that hold the tube in place. 4. If your health care provider tells you to strip the tube to prevent clots and tube blockages: ? Hold the tube at the skin with one hand. Use your other hand to pinch the tubing with your thumb and first finger. ? Gently move your fingers down the tube while squeezing very lightly. This clears any drainage, clots, or tissue from the tube. ? You may need to do this several times each day to keep the tube clear. Do not pull on the tube. 5. Open the bulb cap or the drain plug. Do not touch the  inside of the cap or the bottom of the plug. 6. Turn the device upside down and gently squeeze. 7. Empty all of the drainage into the measuring cup. 8. Compress the bulb or the container and replace the cap or the plug. To compress the bulb or the container, squeeze it firmly in the middle while you close the cap or plug the container. 9. Write down the amount of drainage that you have in each 24-hour period. If you have less than 2 Tbsp (30 mL) of drainage during 24 hours, contact your health care provider. 10. Flush the drainage down the toilet. 11. Wash your hands with soap and water. Contact a health care provider if:  You have redness, swelling, or pain around your drain area.  You have pus or a bad smell coming from your drain area.  You have a fever or chills.  The skin around your drain is warm to the touch.  The amount of drainage that you have is increasing instead of decreasing.  You have drainage that is cloudy.  There is a sudden stop or a sudden decrease in the amount of drainage that you have.  Your drain tube falls out.  Your active drain does not stay compressed after you empty it. Summary  Surgical drains are used to remove extra fluid that normally builds up in a surgical wound after surgery.  Different kinds of surgical drains include active drains and passive drains. Active drains use suction to pull drainage away from the surgical wound, and passive drains allow fluid to drain naturally.  It is important to care for your drain to prevent infection. If your drain is placed at your back, or any other hard-to-reach area, ask another person to assist you.  Contact your health care provider if you have redness, swelling, or pain around your drain area. This information is not intended to replace advice given to you by your health care provider. Make sure you discuss any questions you have with your health care provider. Document Revised: 09/04/2018 Document  Reviewed: 09/04/2018 Elsevier Patient Education  2020 [employer]   Total or Modified Radical Mastectomy, Care After This sheet gives you information about how to care for yourself after your procedure. Your health care provider may also give you more specific instructions. If you have problems or questions, contact your health care provider. What can I expect after the procedure? After the procedure, it is common to have:  Pain.  Numbness.  Stiffness in the arm or shoulder.  Feelings of stress, sadness, or depression. If the lymph nodes under your arm were removed, you may have arm swelling, weakness, or numbness on the same side of your body as your surgery. Follow these instructions at home: Incision care   Follow instructions from your health care provider about how to take care of your incision.   Make sure you: ? Wash your hands with soap and water before you change your bandage (dressing). If soap and water are not available, use hand sanitizer. ? Change your dressing as told by your health care provider. ? Leave stitches (sutures), skin glue, or adhesive strips in place. These skin closures may need to stay in place for 2 weeks or longer. If adhesive strip edges start to loosen and curl up, you may trim the loose edges. Do not remove adhesive strips completely unless your health care provider tells you to do that.  Check your incision area every day for signs of infection. Check for: ? Redness, swelling, or more pain. ? Fluid or blood. ? Warmth. ? Pus or a bad smell.  If you were sent home with a surgical drain in place, follow instructions from your health care provider about emptying it. Bathing  Do not take baths, swim, or use a hot tub until your health care provider approves. Ask your health care provider if you may take showers. You may only be allowed to take sponge baths. Activity  Return to your normal activities as told by your health care provider. Ask your  health care provider what activities are safe for you.  Avoid activities that take a lot of effort.  Be careful to avoid any activities that could cause an injury to your arm on the side of your surgery.  Do not lift anything that is heavier than 10 lb (4.5 kg), or the limit that you are told, until your health care provider says that it is safe.  Avoid lifting with the arm on the side of your surgery.  Do not carry heavy objects on your shoulder.  After your drain is removed, do exercises to prevent stiffness and swelling in your arm. Talk with your health care provider about which exercises are safe for you. General instructions  Take over-the-counter and prescription medicines only as told by your health care provider.  You may eat what you usually do.  Keep your arm raised (elevated) above the level of your heart when you are sitting or lying down.  Do not wear tight jewelry on your arm, wrist, or fingers on the side of your surgery.  You may be given a tight sleeve (compression bandage) to wear over your arm on the side of your surgery. Wear this sleeve as told by your health care provider.  Ask your health care provider when you can start wearing a bra or using a breast prosthesis.  Before you are involved in certain procedures such as giving blood or having your blood pressure checked, tell all your health care providers if lymph nodes under your arm were removed. This is important information. Follow-up  Keep all follow-up visits as told by your health care provider. This is important.  Get checked for extra fluid around your lymph nodes (lymphedema) as often as told by your health care provider. Contact a health care provider if:  You have a fever.  Your pain medicine is not working.  Your arm swelling, weakness, or numbness has not improved after a few weeks.  You have new swelling in your breast area or arm.  You have redness, swelling, or more pain in your  incision area.  You have fluid or blood coming from your incision.  Your incision feels warm to the touch.  You have pus or a bad smell coming from your incision. Get help right away if:  You have very bad pain   in your breast area or arm.  You have chest pain.  You have difficulty breathing. Summary  Follow instructions from your health care provider about how to take care of your incision. Check your incision area every day for signs of infection.  Ask your health care provider what activities are safe for you.  Keep all follow-up visits as told by your health care provider. This is important.  Make sure you know which symptoms should cause you to contact your health care provider or to get help right away. This information is not intended to replace advice given to you by your health care provider. Make sure you discuss any questions you have with your health care provider. Document Revised: 10/04/2018 Document Reviewed: 05/04/2017 Elsevier Patient Education  2020 [employer]    

## 2020-02-19 NOTE — Discharge Summary (Signed)
Physician Discharge Summary  Patient ID: Megan [name] MRN: [id number] DOB/AGE: June 19, 1961 59 y.o.  Admit date: 02/18/2020 Discharge date: 02/19/2020  Admission Diagnoses: Recurrent right breast cancer  Discharge Diagnoses: Same Active Problems:   GERD (gastroesophageal reflux disease)   Type 2 diabetes mellitus (HCC)   Hypertensive heart disease with heart failure (HCC)   Anemia in chronic kidney disease   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, with stage 5 chronic kidney disease, or end stage renal disease (HCC)   Unspecified protein-calorie malnutrition ([place])   Breast cancer ([place])   History of modified radical mastectomy of right breast   ESRD (end stage renal disease) on dialysis ([place])   S/P mastectomy, right   Discharged Condition: good  Hospital Course: Patient is a 59 year old black female who presented with recurrent right breast cancer.  She underwent a right modified radical mastectomy on 02/18/2020.  She tolerated the surgery well.  Her postoperative course has been unremarkable.  Her diet was advanced without difficulty.  She did undergo dialysis while she was in the hospital.  This morning, she is doing well.  She is being discharged home in good and improving condition.  Treatments: surgery: Right modified radical mastectomy on 02/18/2020  Discharge Exam: Blood pressure (!) 142/58, pulse 80, temperature 98.5 F (36.9 C), temperature source Oral, resp. rate 20, height 5\' 2"  (1.575 m), weight 80 kg, SpO2 99 %. General appearance: alert, cooperative and no distress Resp: clear to auscultation bilaterally Breasts: Right mastectomy incision healing well.  No significant hematoma.  No ecchymosis.  JP drainage serosanguineous in nature. Cardio: regular rate and rhythm, S1, S2 normal, no murmur, click, rub or gallop  Disposition: Discharge disposition: 01-Home or Self Care       Discharge Instructions    Diet - low sodium heart healthy    Complete by: As directed    Increase activity slowly   Complete by: As directed      Allergies as of 02/19/2020      Reactions   Hydrocodone Itching      Medication List    TAKE these medications   amLODipine 10 MG tablet Commonly known as: NORVASC TAKE 2 TABLETS(20 MG) BY MOUTH AT BEDTIME What changed: See the new instructions.   anastrozole 1 MG tablet Commonly known as: ARIMIDEX TAKE 1 TABLET(1 MG) BY MOUTH DAILY What changed: See the new instructions.   aspirin EC 81 MG tablet Take 81 mg by mouth daily.   atorvastatin 20 MG tablet Commonly known as: LIPITOR TAKE 1 TABLET DAILY   cinacalcet 30 MG tablet Commonly known as: SENSIPAR Take 2 tablets (60 mg total) by mouth daily. What changed: how much to take   Dialyvite 800-Zinc 15 0.8 MG Tabs Take 1 tablet by mouth daily.   DULoxetine 30 MG capsule Commonly known as: CYMBALTA TAKE 1 CAPSULE(30 MG) BY MOUTH DAILY   Fosrenol 1000 MG chewable tablet Generic drug: lanthanum Chew 1,000-2,000 mg by mouth See admin instructions. Take 2000 mg by mouth with meals and 1000 mg with snacks   gabapentin 300 MG capsule Commonly known as: NEURONTIN TAKE 1 CAPSULE(300 MG) BY MOUTH AT BEDTIME What changed: See the new instructions.   lidocaine-prilocaine cream Commonly known as: EMLA Apply 1 application topically every Monday, Wednesday, and Friday with hemodialysis.   nebivolol 10 MG tablet Commonly known as: Bystolic Take 1 tablet (10 mg total) by mouth daily. What changed: when to take this   oxyCODONE 5 MG immediate release tablet  Commonly known as: Roxicodone Take 1 tablet (5 mg total) by mouth every 6 (six) hours as needed.   pantoprazole 40 MG tablet Commonly known as: PROTONIX TAKE 1 TABLET(40 MG) BY MOUTH DAILY What changed:   how much to take  how to take this  when to take this  additional instructions       Follow-up Information    Aviva Signs, MD. Schedule an appointment as soon as  possible for a visit on 02/24/2020.   Specialty: General Surgery Contact information: [id number] [address] [phone]               Signed: Aviva Signs 02/19/2020, 8:02 AM

## 2020-02-19 NOTE — Progress Notes (Signed)
IV removed from right foot, 2x2 gauze and paper tape applied to site, patient tolerated well. Reviewed AVS with patient and patient's husband, Dewana Ammirati, both verbalized understanding.  Patient taken to lobby via wheelchair and transported home by her husband.

## 2020-02-20 DIAGNOSIS — Z992 Dependence on renal dialysis: Secondary | ICD-10-CM | POA: Diagnosis not present

## 2020-02-20 DIAGNOSIS — N186 End stage renal disease: Secondary | ICD-10-CM | POA: Diagnosis not present

## 2020-02-20 DIAGNOSIS — D631 Anemia in chronic kidney disease: Secondary | ICD-10-CM | POA: Diagnosis not present

## 2020-02-20 DIAGNOSIS — N2581 Secondary hyperparathyroidism of renal origin: Secondary | ICD-10-CM | POA: Diagnosis not present

## 2020-02-20 DIAGNOSIS — D509 Iron deficiency anemia, unspecified: Secondary | ICD-10-CM | POA: Diagnosis not present

## 2020-02-20 DIAGNOSIS — E1129 Type 2 diabetes mellitus with other diabetic kidney complication: Secondary | ICD-10-CM | POA: Diagnosis not present

## 2020-02-20 LAB — SURGICAL PATHOLOGY

## 2020-02-23 DIAGNOSIS — Z992 Dependence on renal dialysis: Secondary | ICD-10-CM | POA: Diagnosis not present

## 2020-02-23 DIAGNOSIS — D509 Iron deficiency anemia, unspecified: Secondary | ICD-10-CM | POA: Diagnosis not present

## 2020-02-23 DIAGNOSIS — N2581 Secondary hyperparathyroidism of renal origin: Secondary | ICD-10-CM | POA: Diagnosis not present

## 2020-02-23 DIAGNOSIS — D631 Anemia in chronic kidney disease: Secondary | ICD-10-CM | POA: Diagnosis not present

## 2020-02-23 DIAGNOSIS — E1129 Type 2 diabetes mellitus with other diabetic kidney complication: Secondary | ICD-10-CM | POA: Diagnosis not present

## 2020-02-23 DIAGNOSIS — N186 End stage renal disease: Secondary | ICD-10-CM | POA: Diagnosis not present

## 2020-02-24 ENCOUNTER — Ambulatory Visit: Payer: Self-pay | Admitting: General Surgery

## 2020-02-24 ENCOUNTER — Ambulatory Visit (INDEPENDENT_AMBULATORY_CARE_PROVIDER_SITE_OTHER): Payer: Self-pay | Admitting: General Surgery

## 2020-02-24 ENCOUNTER — Inpatient Hospital Stay (HOSPITAL_COMMUNITY): Payer: Medicare Other | Attending: Hematology

## 2020-02-24 ENCOUNTER — Other Ambulatory Visit (HOSPITAL_COMMUNITY): Payer: Self-pay | Admitting: *Deleted

## 2020-02-24 ENCOUNTER — Encounter: Payer: Self-pay | Admitting: General Surgery

## 2020-02-24 ENCOUNTER — Other Ambulatory Visit: Payer: Self-pay

## 2020-02-24 VITALS — BP 149/90 | HR 83 | Temp 98.4°F | Resp 16 | Ht 62.0 in | Wt 181.0 lb

## 2020-02-24 DIAGNOSIS — Z79899 Other long term (current) drug therapy: Secondary | ICD-10-CM | POA: Insufficient documentation

## 2020-02-24 DIAGNOSIS — Z79811 Long term (current) use of aromatase inhibitors: Secondary | ICD-10-CM | POA: Insufficient documentation

## 2020-02-24 DIAGNOSIS — N186 End stage renal disease: Secondary | ICD-10-CM | POA: Diagnosis not present

## 2020-02-24 DIAGNOSIS — C50211 Malignant neoplasm of upper-inner quadrant of right female breast: Secondary | ICD-10-CM

## 2020-02-24 DIAGNOSIS — Z9011 Acquired absence of right breast and nipple: Secondary | ICD-10-CM | POA: Insufficient documentation

## 2020-02-24 DIAGNOSIS — Z170 Estrogen receptor positive status [ER+]: Secondary | ICD-10-CM | POA: Diagnosis not present

## 2020-02-24 DIAGNOSIS — Z7982 Long term (current) use of aspirin: Secondary | ICD-10-CM | POA: Diagnosis not present

## 2020-02-24 DIAGNOSIS — M8580 Other specified disorders of bone density and structure, unspecified site: Secondary | ICD-10-CM | POA: Insufficient documentation

## 2020-02-24 DIAGNOSIS — R05 Cough: Secondary | ICD-10-CM | POA: Diagnosis not present

## 2020-02-24 DIAGNOSIS — G629 Polyneuropathy, unspecified: Secondary | ICD-10-CM | POA: Diagnosis not present

## 2020-02-24 DIAGNOSIS — Z992 Dependence on renal dialysis: Secondary | ICD-10-CM | POA: Insufficient documentation

## 2020-02-24 DIAGNOSIS — R079 Chest pain, unspecified: Secondary | ICD-10-CM | POA: Diagnosis not present

## 2020-02-24 DIAGNOSIS — Z09 Encounter for follow-up examination after completed treatment for conditions other than malignant neoplasm: Secondary | ICD-10-CM

## 2020-02-24 LAB — COMPREHENSIVE METABOLIC PANEL
ALT: 14 U/L (ref 0–44)
AST: 22 U/L (ref 15–41)
Albumin: 3.2 g/dL — ABNORMAL LOW (ref 3.5–5.0)
Alkaline Phosphatase: 76 U/L (ref 38–126)
Anion gap: 16 — ABNORMAL HIGH (ref 5–15)
BUN: 63 mg/dL — ABNORMAL HIGH (ref 6–20)
CO2: 26 mmol/L (ref 22–32)
Calcium: 8.9 mg/dL (ref 8.9–10.3)
Chloride: 93 mmol/L — ABNORMAL LOW (ref 98–111)
Creatinine, Ser: 9.63 mg/dL — ABNORMAL HIGH (ref 0.44–1.00)
GFR calc Af Amer: 5 mL/min — ABNORMAL LOW (ref >60)
GFR calc non Af Amer: 4 mL/min — ABNORMAL LOW (ref >60)
Glucose, Bld: 120 mg/dL — ABNORMAL HIGH (ref 70–99)
Potassium: 3.5 mmol/L (ref 3.5–5.1)
Sodium: 135 mmol/L (ref 135–145)
Total Bilirubin: 0.5 mg/dL (ref 0.3–1.2)
Total Protein: 7.3 g/dL (ref 6.5–8.1)

## 2020-02-24 LAB — CBC WITH DIFFERENTIAL/PLATELET
Abs Immature Granulocytes: 0.05 10*3/uL (ref 0.00–0.07)
Basophils Absolute: 0 10*3/uL (ref 0.0–0.1)
Basophils Relative: 0 %
Eosinophils Absolute: 0.4 10*3/uL (ref 0.0–0.5)
Eosinophils Relative: 5 %
HCT: 28.4 % — ABNORMAL LOW (ref 36.0–46.0)
Hemoglobin: 9.1 g/dL — ABNORMAL LOW (ref 12.0–15.0)
Immature Granulocytes: 1 %
Lymphocytes Relative: 18 %
Lymphs Abs: 1.7 10*3/uL (ref 0.7–4.0)
MCH: 32.7 pg (ref 26.0–34.0)
MCHC: 32 g/dL (ref 30.0–36.0)
MCV: 102.2 fL — ABNORMAL HIGH (ref 80.0–100.0)
Monocytes Absolute: 1.5 10*3/uL — ABNORMAL HIGH (ref 0.1–1.0)
Monocytes Relative: 15 %
Neutro Abs: 5.8 10*3/uL (ref 1.7–7.7)
Neutrophils Relative %: 61 %
Platelets: 292 10*3/uL (ref 150–400)
RBC: 2.78 MIL/uL — ABNORMAL LOW (ref 3.87–5.11)
RDW: 15.2 % (ref 11.5–15.5)
WBC: 9.5 10*3/uL (ref 4.0–10.5)
nRBC: 0 % (ref 0.0–0.2)

## 2020-02-24 NOTE — Progress Notes (Signed)
Subjective:     Megan [name]  Patient presents status post right modified radical mastectomy.  She has been doing well with minimal pain.  Her JP drainage has been minimal over the last 2 days. Objective:    BP (!) 149/90   Pulse 83   Temp 98.4 F (36.9 C) (Oral)   Resp 16   Ht 5\' 2"  (1.575 m)   Wt 181 lb (82.1 kg)   SpO2 92%   BMI 33.11 kg/m   General:  alert, cooperative and no distress  Right mastectomy incision healing well.  It was swollen due to a hematoma.  The JP drain was removed and additional hematoma was expressed from the exit wound.  One half of staples were removed. 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  Patient aware of final diagnosis.     Assessment:    Doing well postoperatively.    Plan:   Follow-up in 1 week for wound check.  Will contact Dr. Tomie China office for follow-up appointment.  Continue wearing mastectomy bra during the day.

## 2020-02-25 DIAGNOSIS — Z992 Dependence on renal dialysis: Secondary | ICD-10-CM | POA: Diagnosis not present

## 2020-02-25 DIAGNOSIS — N2581 Secondary hyperparathyroidism of renal origin: Secondary | ICD-10-CM | POA: Diagnosis not present

## 2020-02-25 DIAGNOSIS — D631 Anemia in chronic kidney disease: Secondary | ICD-10-CM | POA: Diagnosis not present

## 2020-02-25 DIAGNOSIS — E1129 Type 2 diabetes mellitus with other diabetic kidney complication: Secondary | ICD-10-CM | POA: Diagnosis not present

## 2020-02-25 DIAGNOSIS — D509 Iron deficiency anemia, unspecified: Secondary | ICD-10-CM | POA: Diagnosis not present

## 2020-02-25 DIAGNOSIS — N186 End stage renal disease: Secondary | ICD-10-CM | POA: Diagnosis not present

## 2020-02-26 ENCOUNTER — Ambulatory Visit (INDEPENDENT_AMBULATORY_CARE_PROVIDER_SITE_OTHER): Payer: Medicare Other | Admitting: Nurse Practitioner

## 2020-02-26 ENCOUNTER — Other Ambulatory Visit: Payer: Self-pay

## 2020-02-26 VITALS — BP 120/72 | HR 85 | Temp 97.7°F | Resp 18 | Wt 178.6 lb

## 2020-02-26 DIAGNOSIS — J310 Chronic rhinitis: Secondary | ICD-10-CM | POA: Diagnosis not present

## 2020-02-26 MED ORDER — FLUTICASONE PROPIONATE 50 MCG/ACT NA SUSP: 2.0000 | Freq: Every day | NASAL | 6 refills | Status: DC

## 2020-02-26 NOTE — Progress Notes (Signed)
Established Patient Office Visit  Subjective:  Patient ID: Megan [name], female    DOB: 1961-01-07  Age: 59 y.o. MRN: [id number]  CC:  Chief Complaint  Patient presents with  . Cough    started x2 weeks, coughs sometimes until vomits, cough drops was taken    HPI Megan [name] is a 59 year old female accompanied by her spouse presenting to the clinic for c/o a mild non productive cough that started about 2 weeks ago. She denied fever, chills, nasal congestion, loss of taste or smell, general body aches. She was Covid tested this week just prior to her mastectomy and was negative. Her spouse has the same cough with no other sxs as well. They have tried no treatments. She does admit that she has had some nasal discharge. No alleviating or exacerbating factors.   Past Medical History:  Diagnosis Date  . (HFpEF) heart failure with preserved ejection fraction ([place])    a. 01/2019 Echo: EF 55-60%, mild conc LVH. DD.  Torn MV chordae.  . Anemia   . Atypical chest pain    a. 08/2018 MV: EF 59%, no ischemia; b. 02/2019 Cath: nonobs dzs.  . Blood transfusion without reported diagnosis   . Cataract   . ESRD (end stage renal disease) on dialysis ([place])    a. HD T, T, S  . Essential hypertension, benign   . GERD (gastroesophageal reflux disease)   . Headache   . Hemorrhoids   . Mixed hyperlipidemia   . Morbid obesity ([place])   . Non-obstructive CAD (coronary artery disease)    a. 02/2019 CathL LM nl, LAD 24m, LCX nl, RCA 25p, EF 55-65%.  Marland Kitchen PONV (postoperative nausea and vomiting)   . S/P colonoscopy Jan 2011   Dr. Benson Norway: sessile polyp (benign lymphoid), large hemorrhoids, repeat 5-10 years  . Temporal arteritis ([place])   . Type 2 diabetes mellitus ([place])   . Wears glasses     Past Surgical History:  Procedure Laterality Date  . ABDOMINAL HYSTERECTOMY    . APPENDECTOMY    . ARTERY BIOPSY N/A 05/09/2018   Procedure: RIGHT TEMPORAL ARTERY BIOPSY;  Surgeon: Judeth Horn, MD;   Location: [facility];  Service: General;  Laterality: N/A;  . CATARACT EXTRACTION W/PHACO Left 02/09/2017   Procedure: CATARACT EXTRACTION PHACO AND INTRAOCULAR LENS PLACEMENT LEFT EYE;  Surgeon: Tonny Branch, MD;  Location: [facility];  Service: Ophthalmology;  Laterality: Left;  CDE: 4.89  . CATARACT EXTRACTION W/PHACO Right 06/04/2017   Procedure: CATARACT EXTRACTION PHACO AND INTRAOCULAR LENS PLACEMENT (IOC);  Surgeon: Tonny Branch, MD;  Location: [facility];  Service: Ophthalmology;  Laterality: Right;  CDE: 4.12  . CHOLECYSTECTOMY  09/29/2011   Procedure: LAPAROSCOPIC CHOLECYSTECTOMY;  Surgeon: Jamesetta So, MD;  Location: [facility];  Service: General;  Laterality: N/A;  . COLONOSCOPY  Jan 2011   Dr. Benson Norway: sessile polyp (benign lymphoid), large hemorrhoids, repeat 5-10 years  . COLONOSCOPY N/A 06/12/2016   Procedure: COLONOSCOPY;  Surgeon: Daneil Dolin, MD;  [facility];  Service: Endoscopy;  Laterality: N/A;  1230   . ESOPHAGOGASTRODUODENOSCOPY  09/05/2011   DGL:OVFIE hiatal hernia; remainder of exam normal. No explanation for patient's abdominal pain with today's examination  . ESOPHAGOGASTRODUODENOSCOPY N/A 12/17/2013   Dr. [name]: gastric erythema, erosion, mild chronic inflammation on path   . LAPAROSCOPIC APPENDECTOMY  09/29/2011   Procedure: APPENDECTOMY LAPAROSCOPIC;  Surgeon: Jamesetta So, MD;  Location: [facility];  Service: General;;  incidental appendectomy  .  LEFT HEART CATH AND CORONARY ANGIOGRAPHY N/A 02/28/2019   Procedure: LEFT HEART CATH AND CORONARY ANGIOGRAPHY;  Surgeon: Jettie Booze, MD;  Location: [facility];  Service: Cardiovascular;  Laterality: N/A;  . MASTECTOMY MODIFIED RADICAL Right 02/18/2020   Procedure: MASTECTOMY MODIFIED RADICAL;  Surgeon: Aviva Signs, MD;  Location[facility];  Service: General;  Laterality: Right;  . PARTIAL MASTECTOMY WITH NEEDLE LOCALIZATION AND AXILLARY SENTINEL LYMPH NODE BX Right 09/18/2018   Procedure: RIGHT PARTIAL MASTECTOMY  AFTER NEEDLE LOCALIZATION, SENTINEL LYMPH NODE BIOPSY RIGHT AXILLA;  Surgeon: Aviva Signs, MD;  Location: [facility];  Service: General;  Laterality: Right;    Family History  Problem Relation Age of Onset  . Hypertension Mother   . Coronary artery disease Mother   . Diabetes Mother   . Hypertension Sister   . Coronary artery disease Sister   . Hypertension Brother   . Heart attack Father   . Hypertension Son   . Heart attack Maternal Aunt   . Hypertension Maternal Aunt   . Diabetes Maternal Aunt   . Heart attack Maternal Uncle   . Hypertension Maternal Uncle   . Diabetes Maternal Uncle   . Heart attack Paternal Aunt   . Hypertension Paternal Aunt   . Diabetes Paternal Aunt   . Heart attack Paternal Uncle   . Hypertension Paternal Uncle   . Diabetes Paternal Uncle   . Heart attack Maternal Grandmother   . Heart attack Maternal Grandfather   . Heart attack Paternal Grandmother   . Heart attack Paternal Grandfather   . Colon cancer Neg Hx     Social History   Socioeconomic History  . Marital status: Married    Spouse name: Not on file  . Number of children: Not on file  . Years of education: Not on file  . Highest education level: Not on file  Occupational History  . 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[id number]  Tobacco Use  . Smoking status: Never Smoker  . Smokeless tobacco: Never Used  Vaping Use  . Vaping Use: Never used  Substance and Sexual Activity  . Alcohol use: No  . Drug use: No  . Sexual activity: Yes    Birth control/protection: Surgical  Other Topics Concern  . Not on file  Social History Narrative   Works at [employer] in [place].    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   Also has to get items from high shelves-causes achy pain in shoulder area      Married.   Children are grown, out of house.   Social Determinants of Health   Financial Resource Strain:   . Difficulty of Paying Living Expenses:   Food Insecurity:   .  Worried About Charity fundraiser in the Last Year:   . Arboriculturist in the Last Year:   Transportation Needs:   . Film/video editor (Medical):   Marland Kitchen Lack of Transportation (Non-Medical):   Physical Activity:   . Days of Exercise per Week:   . Minutes of Exercise per Session:   Stress:   . Feeling of Stress :   Social Connections:   . Frequency of Communication with Friends and Family:   . Frequency of Social Gatherings with Friends and Family:   . Attends Religious Services:   . Active Member of Clubs or Organizations:   . Attends Archivist Meetings:   Marland Kitchen Marital  Status:   Intimate Partner Violence:   . Fear of Current or Ex-Partner:   . Emotionally Abused:   Marland Kitchen Physically Abused:   . Sexually Abused:     Outpatient Medications Prior to Visit  Medication Sig Dispense Refill  . amLODipine (NORVASC) 10 MG tablet TAKE 2 TABLETS(20 MG) BY MOUTH AT BEDTIME (Patient taking differently: Take 10 mg by mouth at bedtime. ) 60 tablet 5  . anastrozole (ARIMIDEX) 1 MG tablet TAKE 1 TABLET(1 MG) BY MOUTH DAILY (Patient taking differently: Take 1 mg by mouth daily. ) 30 tablet 4  . aspirin EC 81 MG tablet Take 81 mg by mouth daily.    Marland Kitchen atorvastatin (LIPITOR) 20 MG tablet TAKE 1 TABLET DAILY (Patient taking differently: Take 20 mg by mouth daily. ) 90 tablet 3  . B Complex-C-Zn-Folic Acid (DIALYVITE 712-[id number] 15) 0.8 MG TABS Take 1 tablet by mouth daily.    . cinacalcet (SENSIPAR) 30 MG tablet Take 2 tablets (60 mg total) by mouth daily. (Patient taking differently: Take 30 mg by mouth daily. ) 90 tablet 0  . DULoxetine (CYMBALTA) 30 MG capsule TAKE 1 CAPSULE(30 MG) BY MOUTH DAILY 90 capsule 1  . gabapentin (NEURONTIN) 300 MG capsule TAKE 1 CAPSULE(300 MG) BY MOUTH AT BEDTIME (Patient taking differently: Take 300 mg by mouth at bedtime. ) 90 capsule 3  . lanthanum (FOSRENOL) 1000 MG chewable tablet Chew 1,000-2,000 mg by mouth See admin instructions. Take 2000 mg by mouth with meals  and 1000 mg with snacks    . lidocaine-prilocaine (EMLA) cream Apply 1 application topically every Monday, Wednesday, and Friday with hemodialysis.     Marland Kitchen nebivolol (BYSTOLIC) 10 MG tablet Take 1 tablet (10 mg total) by mouth daily. (Patient taking differently: Take 10 mg by mouth in the morning and at bedtime. ) 90 tablet 3  . oxyCODONE (ROXICODONE) 5 MG immediate release tablet Take 1 tablet (5 mg total) by mouth every 6 (six) hours as needed. 25 tablet 0  . pantoprazole (PROTONIX) 40 MG tablet TAKE 1 TABLET(40 MG) BY MOUTH DAILY (Patient taking differently: Take 40 mg by mouth daily. ) 90 tablet 3   No facility-administered medications prior to visit.    Allergies  Allergen Reactions  . Hydrocodone Itching    ROS Review of Systems  All other systems reviewed and are negative.     Objective:    Physical Exam Vitals reviewed.  Constitutional:      General: She is not in acute distress.    Appearance: Normal appearance. She is well-developed and well-groomed. She is not ill-appearing, toxic-appearing or diaphoretic.       Comments: Mastectomy site with staples and site of entry of JP drain that has been removed reportedly 02/24/2020 wnl  HENT:     Head: Normocephalic.     Right Ear: Hearing and external ear normal.     Left Ear: Hearing and external ear normal.     Nose: Rhinorrhea present. No congestion.     Right Sinus: No maxillary sinus tenderness or frontal sinus tenderness.     Left Sinus: No maxillary sinus tenderness or frontal sinus tenderness.     Mouth/Throat:     Mouth: Mucous membranes are moist.     Pharynx: Oropharynx is clear. No oropharyngeal exudate or posterior oropharyngeal erythema.  Eyes:     General: No scleral icterus.    Extraocular Movements: Extraocular movements intact.     Conjunctiva/sclera: Conjunctivae normal.     Pupils: Pupils are  equal, round, and reactive to light.  Cardiovascular:     Rate and Rhythm: Normal rate and regular rhythm.      Heart sounds: Normal heart sounds, S1 normal and S2 normal.  Pulmonary:     Effort: Pulmonary effort is normal.     Breath sounds: Normal breath sounds.  Abdominal:     Tenderness: There is no guarding.  Musculoskeletal:        General: Normal range of motion.     Cervical back: Normal range of motion and neck supple.     Right lower leg: No edema.     Left lower leg: No edema.  Skin:    General: Skin is warm and dry.     Coloration: Skin is not jaundiced.     Findings: No erythema.  Neurological:     General: No focal deficit present.     Mental Status: She is alert and oriented to person, place, and time.     GCS: GCS eye subscore is 4. GCS verbal subscore is 5. GCS motor subscore is 6.     Gait: Gait normal.  Psychiatric:        Attention and Perception: Attention normal.        Mood and Affect: Mood normal.        Speech: Speech normal.        Behavior: Behavior normal. Behavior is cooperative.        Thought Content: Thought content normal.        Cognition and Memory: Cognition normal.        Judgment: Judgment normal.     BP 120/72 (BP Location: Right Arm, Patient Position: Sitting, Cuff Size: Normal)   Pulse 85   Temp 97.7 F (36.5 C) (Temporal)   Resp 18   Wt 178 lb 9.6 oz (81 kg)   SpO2 95%   BMI 32.67 kg/m  Wt Readings from Last 3 Encounters:  02/26/20 178 lb 9.6 oz (81 kg)  02/24/20 181 lb (82.1 kg)  02/18/20 176 lb 5.9 oz (80 kg)     Health Maintenance Due  Topic Date Due  . Hepatitis C Screening  Never done  . URINE MICROALBUMIN  03/18/2015  . TETANUS/TDAP  07/21/2015  . OPHTHALMOLOGY EXAM  09/15/2015  . FOOT EXAM  08/26/2016  . HEMOGLOBIN A1C  08/05/2019    There are no preventive care reminders to display for this patient.  Lab Results  Component Value Date   TSH 1.837 02/03/2019   Lab Results  Component Value Date   WBC 9.5 02/24/2020   HGB 9.1 (L) 02/24/2020   HCT 28.4 (L) 02/24/2020   MCV 102.2 (H) 02/24/2020   PLT 292  02/24/2020   Lab Results  Component Value Date   NA 135 02/24/2020   K 3.5 02/24/2020   CO2 26 02/24/2020   GLUCOSE 120 (H) 02/24/2020   BUN 63 (H) 02/24/2020   CREATININE 9.63 (H) 02/24/2020   BILITOT 0.5 02/24/2020   ALKPHOS 76 02/24/2020   AST 22 02/24/2020   ALT 14 02/24/2020   PROT 7.3 02/24/2020   ALBUMIN 3.2 (L) 02/24/2020   CALCIUM 8.9 02/24/2020   ANIONGAP 16 (H) 02/24/2020   Lab Results  Component Value Date   CHOL 76 03/01/2019   Lab Results  Component Value Date   HDL 31 (L) 03/01/2019   Lab Results  Component Value Date   LDLCALC 29 03/01/2019   Lab Results  Component Value Date   TRIG 80  03/01/2019   Lab Results  Component Value Date   CHOLHDL 2.5 03/01/2019   Lab Results  Component Value Date   HGBA1C 5.8 (H) 02/03/2019      Assessment & Plan:   Problem List Items Addressed This Visit    None    Visit Diagnoses    Rhinitis, unspecified type    -  Primary   Relevant Medications   fluticasone (FLONASE) 50 MCG/ACT nasal spray    You have rhinitis of the nares bilaterally which could be triggering your cough: This could be trigger from allergies or virus, you may use daily antihistamine non drowsy such as Claritin. May continue to use Flonase one spray each nostril after the 5 day course of 2 sprays as RX directed if needed for seasonal allergies. If your cough worsens, does not resolve, or you develop new sxs, seek medical attention.   You tested negative just prior to your mastectomy earlier this week and had your sxs for at least a week prior therefore I do not suspect COVID  Meds ordered this encounter  Medications  . fluticasone (FLONASE) 50 MCG/ACT nasal spray    Sig: Place 2 sprays into both nostrils daily.    Dispense:  16 g    Refill:  6    Follow-up: Return if symptoms worsen or fail to improve.    Annie Main, FNP

## 2020-02-26 NOTE — Patient Instructions (Signed)
You have rhinitis of the nares bilaterally which could be triggering your cough: This could be trigger from allergies or virus, you may use daily antihistamine non drowsy such as Claritin. May continue to use Flonase one spray each nostril after the 5 day course of 2 sprays as RX directed if needed for seasonal allergies. If your cough worsens, does not resolve, or you develop new sxs, seek medical attention.   You tested negative just prior to your mastectomy earlier this week and had your sxs for at least a week prior therefore I do not suspect COVID

## 2020-02-27 DIAGNOSIS — N2581 Secondary hyperparathyroidism of renal origin: Secondary | ICD-10-CM | POA: Diagnosis not present

## 2020-02-27 DIAGNOSIS — Z992 Dependence on renal dialysis: Secondary | ICD-10-CM | POA: Diagnosis not present

## 2020-02-27 DIAGNOSIS — N186 End stage renal disease: Secondary | ICD-10-CM | POA: Diagnosis not present

## 2020-02-27 DIAGNOSIS — D509 Iron deficiency anemia, unspecified: Secondary | ICD-10-CM | POA: Diagnosis not present

## 2020-02-27 DIAGNOSIS — E1129 Type 2 diabetes mellitus with other diabetic kidney complication: Secondary | ICD-10-CM | POA: Diagnosis not present

## 2020-02-27 DIAGNOSIS — D631 Anemia in chronic kidney disease: Secondary | ICD-10-CM | POA: Diagnosis not present

## 2020-03-01 DIAGNOSIS — N186 End stage renal disease: Secondary | ICD-10-CM | POA: Diagnosis not present

## 2020-03-01 DIAGNOSIS — N2581 Secondary hyperparathyroidism of renal origin: Secondary | ICD-10-CM | POA: Diagnosis not present

## 2020-03-01 DIAGNOSIS — D631 Anemia in chronic kidney disease: Secondary | ICD-10-CM | POA: Diagnosis not present

## 2020-03-01 DIAGNOSIS — E1129 Type 2 diabetes mellitus with other diabetic kidney complication: Secondary | ICD-10-CM | POA: Diagnosis not present

## 2020-03-01 DIAGNOSIS — D509 Iron deficiency anemia, unspecified: Secondary | ICD-10-CM | POA: Diagnosis not present

## 2020-03-01 DIAGNOSIS — Z992 Dependence on renal dialysis: Secondary | ICD-10-CM | POA: Diagnosis not present

## 2020-03-02 ENCOUNTER — Inpatient Hospital Stay (HOSPITAL_BASED_OUTPATIENT_CLINIC_OR_DEPARTMENT_OTHER): Payer: Medicare Other | Admitting: Hematology

## 2020-03-02 ENCOUNTER — Ambulatory Visit (INDEPENDENT_AMBULATORY_CARE_PROVIDER_SITE_OTHER): Payer: Self-pay | Admitting: General Surgery

## 2020-03-02 ENCOUNTER — Other Ambulatory Visit: Payer: Self-pay

## 2020-03-02 ENCOUNTER — Encounter: Payer: Self-pay | Admitting: General Surgery

## 2020-03-02 VITALS — BP 119/70 | HR 82 | Temp 98.6°F | Resp 16 | Ht 62.0 in | Wt 180.0 lb

## 2020-03-02 VITALS — BP 134/71 | HR 80 | Temp 96.7°F | Resp 18 | Wt 179.5 lb

## 2020-03-02 DIAGNOSIS — C50211 Malignant neoplasm of upper-inner quadrant of right female breast: Secondary | ICD-10-CM

## 2020-03-02 DIAGNOSIS — G629 Polyneuropathy, unspecified: Secondary | ICD-10-CM | POA: Diagnosis not present

## 2020-03-02 DIAGNOSIS — Z170 Estrogen receptor positive status [ER+]: Secondary | ICD-10-CM | POA: Diagnosis not present

## 2020-03-02 DIAGNOSIS — M8580 Other specified disorders of bone density and structure, unspecified site: Secondary | ICD-10-CM | POA: Diagnosis not present

## 2020-03-02 DIAGNOSIS — Z09 Encounter for follow-up examination after completed treatment for conditions other than malignant neoplasm: Secondary | ICD-10-CM

## 2020-03-02 DIAGNOSIS — Z79811 Long term (current) use of aromatase inhibitors: Secondary | ICD-10-CM | POA: Diagnosis not present

## 2020-03-02 DIAGNOSIS — N186 End stage renal disease: Secondary | ICD-10-CM | POA: Diagnosis not present

## 2020-03-02 NOTE — Patient Instructions (Addendum)
[place] at [facility] Discharge Instructions  You were seen today by Dr. Delton Coombes. He went over your recent results and scans. Eat more protein-dense foods or supplement your diet with protein shakes to increase your blood albumin level. You will be scheduled for a breast mammogram in January 2022. Dr. Delton Coombes will see you back in 6 months for labs and follow up.   Thank you for choosing [place] at [facility] to provide your oncology and hematology care.  To afford each patient quality time with our provider, please arrive at least 15 minutes before your scheduled appointment time.   If you have a lab appointment with the [facility] please come in thru the Main Entrance and check in at the main information desk  You need to re-schedule your appointment should you arrive 10 or more minutes late.  We strive to give you quality time with our providers, and arriving late affects you and other patients whose appointments are after yours.  Also, if you no show three or more times for appointments you may be dismissed from the clinic at the providers discretion.     Again, thank you for choosing [facility].  Our hope is that these requests will decrease the amount of time that you wait before being seen by our physicians.       _____________________________________________________________  Should you have questions after your visit to [facility], please contact our office at [phone] between the hours of 8:00 a.m. and 4:30 p.m.  Voicemails left after 4:00 p.m. will not be returned until the following business day.  For prescription refill requests, have your pharmacy contact our office and allow 72 hours.    Cancer Center Support Programs:   > Cancer Support Group  2nd Tuesday of the month 1pm-2pm, Journey Room

## 2020-03-02 NOTE — Progress Notes (Signed)
Subjective:     Megan [name]  Here for follow-up, status post right modified radical mastectomy.  Doing well.  Has no complaints.  Saw Dr. Delton Coombes of oncology earlier today. Objective:    BP 119/70   Pulse 82   Temp 98.6 F (37 C) (Oral)   Resp 16   Ht 5\' 2"  (1.575 m)   Wt 180 lb (81.6 kg)   SpO2 93%   BMI 32.92 kg/m   General:  alert, cooperative and no distress  Right mastectomy continues to heal well.  Less swelling noted.  No ecchymosis.  Remaining staples removed.     Assessment:    Doing well postoperatively.    Plan:   We will see again in 1 month for a wound check.

## 2020-03-02 NOTE — Progress Notes (Signed)
[facility] [address], [place] [address]   Patient Care Team: [name], MD as PCP - General (Family Medicine) [name], [name], MD as PCP - Cardiology (Cardiology) [name] [name], MD (Gastroenterology) [name], MD as Consulting Physician (Cardiology) [name], RN as Oncology Nurse Navigator (Oncology)  SUMMARY OF ONCOLOGIC HISTORY: Oncology History  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[place])  09/18/2018 Initial Diagnosis   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[place])   09/26/2018 Cancer Staging   Staging form: Breast, AJCC 8th Edition - Clinical stage from 09/26/2018: Stage IB (cT1c, cN0, cM0, G3, ER+, PR-, HER2-) - Signed by Derek Jack, MD on 09/26/2018   10/30/2018 -  Chemotherapy   The patient had DOXOrubicin (ADRIAMYCIN) chemo injection 108 mg, 60 mg/m2 = 108 mg, Intravenous,  Once, 4 of 4 cycles Administration: 108 mg (10/30/2018), 108 mg (11/20/2018), 108 mg (12/11/2018), 108 mg (01/01/2019) palonosetron (ALOXI) injection 0.25 mg, 0.25 mg, Intravenous,  Once, 4 of 4 cycles Administration: 0.25 mg (10/30/2018), 0.25 mg (11/20/2018), 0.25 mg (12/11/2018), 0.25 mg (01/01/2019) pegfilgrastim (NEULASTA ONPRO KIT) injection 6 mg, 6 mg, Subcutaneous, Once, 4 of 4 cycles Administration: 6 mg (10/30/2018), 6 mg (11/20/2018), 6 mg (12/11/2018), 6 mg (01/01/2019) cyclophosphamide (CYTOXAN) 820 mg in sodium chloride 0.9 % 250 mL chemo infusion, 450 mg/m2 = 820 mg (75 % of original dose 600 mg/m2), Intravenous,  Once, 4 of 4 cycles Dose modification: 450 mg/m2 (75 % of original dose 600 mg/m2, Cycle 1, Reason: Other (see comments), Comment: hemodialysis) Administration: 820 mg (10/30/2018), 820 mg (11/20/2018), 820 mg (12/11/2018), 820 mg (01/01/2019) fosaprepitant (EMEND) 150 mg, dexamethasone (DECADRON) 12 mg in sodium chloride 0.9 % 145 mL IVPB, , Intravenous,  Once, 4 of 4  cycles Administration:  (10/30/2018),  (11/20/2018),  (12/11/2018),  (01/01/2019)  for chemotherapy treatment.      CHIEF COMPLIANT: Follow-up for right breast cancer   INTERVAL HISTORY: [name] is a 59 y.o. female here today for follow up of her right breast cancer. Her last visit was on 09/22/2019.  Today she is accompanied by her husband. She reports that she tolerated the surgery well and will have the staples removed today. She reports that Dr. Marla Roe will not proceed with reconstruction surgery due to her being on HD. She is tolerating the Arimidex well and denies having hot flashes or joint aches. She is not taking vitamin D or calcium. Her burning sensation at the bottom of her feet is stable.  Her next mammogram is due in January 2022.   REVIEW OF SYSTEMS:   Review of Systems  Constitutional: Positive for appetite change (mildly decreased) and fatigue (moderate).  Respiratory: Positive for cough.   Cardiovascular: Positive for chest pain (3/10 R breast pain).  Endocrine: Negative for hot flashes.  Musculoskeletal: Negative for arthralgias.  Neurological: Positive for headaches and numbness (burning in bottom of feet).  All other systems reviewed and are negative.   I have reviewed the past medical history, past surgical history, social history and family history with the patient and they are unchanged from previous note.   ALLERGIES:   is allergic to hydrocodone.   MEDICATIONS:  Current Outpatient Medications  Medication Sig Dispense Refill  . amLODipine (NORVASC) 10 MG tablet TAKE 2 TABLETS(20 MG) BY MOUTH AT BEDTIME (Patient taking differently: Take 10 mg by mouth at bedtime. ) 60 tablet 5  . anastrozole (ARIMIDEX) 1 MG  tablet TAKE 1 TABLET(1 MG) BY MOUTH DAILY (Patient taking differently: Take 1 mg by mouth daily. ) 30 tablet 4  . aspirin EC 81 MG tablet Take 81 mg by mouth daily.    Marland Kitchen atorvastatin (LIPITOR) 20 MG tablet TAKE 1 TABLET DAILY (Patient  taking differently: Take 20 mg by mouth daily. ) 90 tablet 3  . B Complex-C-Zn-Folic Acid (DIALYVITE 599-JTTS 15) 0.8 MG TABS Take 1 tablet by mouth daily.    . cinacalcet (SENSIPAR) 30 MG tablet Take 2 tablets (60 mg total) by mouth daily. (Patient taking differently: Take 30 mg by mouth daily. ) 90 tablet 0  . DULoxetine (CYMBALTA) 30 MG capsule TAKE 1 CAPSULE(30 MG) BY MOUTH DAILY 90 capsule 1  . fluticasone (FLONASE) 50 MCG/ACT nasal spray Place 2 sprays into both nostrils daily. 16 g 6  . gabapentin (NEURONTIN) 300 MG capsule TAKE 1 CAPSULE(300 MG) BY MOUTH AT BEDTIME (Patient taking differently: Take 300 mg by mouth at bedtime. ) 90 capsule 3  . lanthanum (FOSRENOL) 1000 MG chewable tablet Chew 1,000-2,000 mg by mouth See admin instructions. Take 2000 mg by mouth with meals and 1000 mg with snacks    . lidocaine-prilocaine (EMLA) cream Apply 1 application topically every Monday, Wednesday, and Friday with hemodialysis.     Marland Kitchen nebivolol (BYSTOLIC) 10 MG tablet Take 1 tablet (10 mg total) by mouth daily. (Patient taking differently: Take 10 mg by mouth in the morning and at bedtime. ) 90 tablet 3  . oxyCODONE (ROXICODONE) 5 MG immediate release tablet Take 1 tablet (5 mg total) by mouth every 6 (six) hours as needed. 25 tablet 0  . pantoprazole (PROTONIX) 40 MG tablet TAKE 1 TABLET(40 MG) BY MOUTH DAILY (Patient taking differently: Take 40 mg by mouth daily. ) 90 tablet 3   No current facility-administered medications for this visit.     PHYSICAL EXAMINATION: Performance status (ECOG): 1 - Symptomatic but completely ambulatory  There were no vitals filed for this visit. Wt Readings from Last 3 Encounters:  02/26/20 178 lb 9.6 oz (81 kg)  02/24/20 181 lb (82.1 kg)  02/18/20 176 lb 5.9 oz (80 kg)   Physical Exam Vitals reviewed.  Constitutional:      Appearance: Normal appearance. She is obese.  Cardiovascular:     Rate and Rhythm: Normal rate and regular rhythm.     Pulses: Normal  pulses.     Heart sounds: Normal heart sounds.  Pulmonary:     Effort: Pulmonary effort is normal.     Breath sounds: Normal breath sounds.  Chest:     Breasts:        Right: Absent.   Musculoskeletal:     Right lower leg: No edema.     Left lower leg: No edema.  Neurological:     General: No focal deficit present.     Mental Status: She is alert and oriented to person, place, and time.  Psychiatric:        Mood and Affect: Mood normal.        Behavior: Behavior normal.     Breast Exam Chaperone: Milinda Antis, MD     LABORATORY DATA:  I have reviewed the data as listed CMP Latest Ref Rng & Units 02/24/2020 02/19/2020 02/17/2020  Glucose 70 - 99 mg/dL 120(H) 109(H) 106(H)  BUN 6 - 20 mg/dL 63(H) 32(H) 58(H)  Creatinine 0.44 - 1.00 mg/dL 9.63(H) 6.17(H) 9.26(H)  Sodium 135 - 145 mmol/L 135 136 141  Potassium 3.5 -  5.1 mmol/L 3.5 3.5 3.4(L)  Chloride 98 - 111 mmol/L 93(L) 95(L) 97(L)  CO2 22 - 32 mmol/L _0 Calcium 8.9 - 10.3 mg/dL 8.9 8.3(L) 9.8  Total Protein 6.5 - 8.1 g/dL 7.3 - 7.7  Total Bilirubin 0.3 - 1.2 mg/dL 0.5 - 0.6  Alkaline Phos 38 - 126 U/L 76 - 77  AST 15 - 41 U/L 22 - 23  ALT 0 - 44 U/L 14 - 23   No results found for: VTX521 Lab Results  Component Value Date   WBC 9.5 02/24/2020   HGB 9.1 (L) 02/24/2020   HCT 28.4 (L) 02/24/2020   MCV 102.2 (H) 02/24/2020   PLT 292 02/24/2020   NEUTROABS 5.8 02/24/2020    ASSESSMENT:  1.  Stage Ib (T1CN0) right breast cancer, ER positive, PR and HER-2 negative: -Right lumpectomy and sentinel lymph node biopsy on 09/18/2018 shows 1.5 cm IDC, grade 3, associated with high-grade DCIS, negative margins, 0/3 lymph nodes positive, ER 50%, PR negative and HER-2 negative, Ki-67 60%. -Oncotype DX recurrence score 47.  Distant recurrence at 9 years with tamoxifen alone is 36%.  Absolute chemotherapy benefit is more than 15%. -Adjuvant chemotherapy with 4 cycles of AC from 10/30/2018 through 01/01/2019. -She was started on  anastrozole in June 2020. -Mammogram on 09/02/2019 showed calcifications in the posterior aspect of upper outer quadrant of the right breast. -Right mastectomy on 02/18/2020 shows high-grade DCIS, 1.5 cm, no invasive carcinoma, resection margins negative.  0/11 lymph nodes.  ER 30% positive, PR negative.  2.  Osteopenia: -Bone density on 01/20/2019 shows T score -1.9.   PLAN:  1.  Stage Ib (T1CN0) right breast cancer, ER positive, PR and HER-2 negative: -We discussed the results of the mastectomy pathology.  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  No change in management at this time.  She does not require any radiation therapy. -She will continue anastrozole at least for 5 years. -I will see her back in 6 months for follow-up.  I plan to schedule left breast mammogram in January next year.  2.  Osteopenia: -She will continue calcium and vitamin D as needed.  I plan to repeat bone density after 2 years.  3.  ESRD on HD: -She will continue hemodialysis.  Her nephrologist is Dr. Marval Regal.  4.  Neuropathy: -She has neuropathy of unknown etiology with burning sensation in the bottom of the feet on and off.  No symptoms in the hands.   No orders of the defined types were placed in this encounter.  The patient has a good understanding of the overall plan. she agrees with it. she will call with any problems that may develop before the next visit here.    Derek Jack, MD [facility] [phone]   I, Milinda Antis, am acting as a scribe for Dr. Sanda Linger.  I, Derek Jack MD, have reviewed the above documentation for accuracy and completeness, and I agree with the above.

## 2020-03-03 DIAGNOSIS — Z992 Dependence on renal dialysis: Secondary | ICD-10-CM | POA: Diagnosis not present

## 2020-03-03 DIAGNOSIS — E119 Type 2 diabetes mellitus without complications: Secondary | ICD-10-CM | POA: Diagnosis not present

## 2020-03-03 DIAGNOSIS — T7840XA Allergy, unspecified, initial encounter: Secondary | ICD-10-CM | POA: Insufficient documentation

## 2020-03-03 DIAGNOSIS — D631 Anemia in chronic kidney disease: Secondary | ICD-10-CM | POA: Diagnosis not present

## 2020-03-03 DIAGNOSIS — N186 End stage renal disease: Secondary | ICD-10-CM | POA: Diagnosis not present

## 2020-03-03 DIAGNOSIS — E1129 Type 2 diabetes mellitus with other diabetic kidney complication: Secondary | ICD-10-CM | POA: Diagnosis not present

## 2020-03-03 DIAGNOSIS — D509 Iron deficiency anemia, unspecified: Secondary | ICD-10-CM | POA: Diagnosis not present

## 2020-03-03 DIAGNOSIS — N2581 Secondary hyperparathyroidism of renal origin: Secondary | ICD-10-CM | POA: Diagnosis not present

## 2020-03-03 DIAGNOSIS — T782XXA Anaphylactic shock, unspecified, initial encounter: Secondary | ICD-10-CM | POA: Insufficient documentation

## 2020-03-03 DIAGNOSIS — E039 Hypothyroidism, unspecified: Secondary | ICD-10-CM | POA: Diagnosis not present

## 2020-03-05 DIAGNOSIS — D631 Anemia in chronic kidney disease: Secondary | ICD-10-CM | POA: Diagnosis not present

## 2020-03-05 DIAGNOSIS — Z992 Dependence on renal dialysis: Secondary | ICD-10-CM | POA: Diagnosis not present

## 2020-03-05 DIAGNOSIS — N2581 Secondary hyperparathyroidism of renal origin: Secondary | ICD-10-CM | POA: Diagnosis not present

## 2020-03-05 DIAGNOSIS — E1129 Type 2 diabetes mellitus with other diabetic kidney complication: Secondary | ICD-10-CM | POA: Diagnosis not present

## 2020-03-05 DIAGNOSIS — N186 End stage renal disease: Secondary | ICD-10-CM | POA: Diagnosis not present

## 2020-03-05 DIAGNOSIS — D509 Iron deficiency anemia, unspecified: Secondary | ICD-10-CM | POA: Diagnosis not present

## 2020-03-08 DIAGNOSIS — N186 End stage renal disease: Secondary | ICD-10-CM | POA: Diagnosis not present

## 2020-03-08 DIAGNOSIS — E1129 Type 2 diabetes mellitus with other diabetic kidney complication: Secondary | ICD-10-CM | POA: Diagnosis not present

## 2020-03-08 DIAGNOSIS — D631 Anemia in chronic kidney disease: Secondary | ICD-10-CM | POA: Diagnosis not present

## 2020-03-08 DIAGNOSIS — N2581 Secondary hyperparathyroidism of renal origin: Secondary | ICD-10-CM | POA: Diagnosis not present

## 2020-03-08 DIAGNOSIS — D509 Iron deficiency anemia, unspecified: Secondary | ICD-10-CM | POA: Diagnosis not present

## 2020-03-08 DIAGNOSIS — Z992 Dependence on renal dialysis: Secondary | ICD-10-CM | POA: Diagnosis not present

## 2020-03-10 DIAGNOSIS — Z992 Dependence on renal dialysis: Secondary | ICD-10-CM | POA: Diagnosis not present

## 2020-03-10 DIAGNOSIS — N186 End stage renal disease: Secondary | ICD-10-CM | POA: Diagnosis not present

## 2020-03-10 DIAGNOSIS — E1129 Type 2 diabetes mellitus with other diabetic kidney complication: Secondary | ICD-10-CM | POA: Diagnosis not present

## 2020-03-10 DIAGNOSIS — D509 Iron deficiency anemia, unspecified: Secondary | ICD-10-CM | POA: Diagnosis not present

## 2020-03-10 DIAGNOSIS — D631 Anemia in chronic kidney disease: Secondary | ICD-10-CM | POA: Diagnosis not present

## 2020-03-10 DIAGNOSIS — N2581 Secondary hyperparathyroidism of renal origin: Secondary | ICD-10-CM | POA: Diagnosis not present

## 2020-03-11 ENCOUNTER — Ambulatory Visit (INDEPENDENT_AMBULATORY_CARE_PROVIDER_SITE_OTHER): Payer: Medicare Other | Admitting: Family Medicine

## 2020-03-11 ENCOUNTER — Other Ambulatory Visit: Payer: Self-pay

## 2020-03-11 ENCOUNTER — Telehealth: Payer: Self-pay

## 2020-03-11 VITALS — BP 118/64 | HR 102 | Temp 98.5°F | Ht 62.0 in | Wt 178.0 lb

## 2020-03-11 DIAGNOSIS — J209 Acute bronchitis, unspecified: Secondary | ICD-10-CM | POA: Diagnosis not present

## 2020-03-11 MED ORDER — HYDROCODONE-HOMATROPINE 5-1.5 MG/5ML PO SYRP: 5.0000 mL | ORAL_SOLUTION | Freq: Three times a day (TID) | ORAL | 0 refills | Status: DC | PRN

## 2020-03-11 MED ORDER — DOXYCYCLINE HYCLATE 100 MG PO TABS
100.0000 mg | ORAL_TABLET | Freq: Two times a day (BID) | ORAL | 0 refills | Status: DC
Start: 2020-03-11 — End: 2020-04-01

## 2020-03-11 NOTE — Progress Notes (Signed)
Subjective:    Patient ID: Megan [name], female    DOB: [birth year], 59 y.o.   MRN: [id number]  HPI Patient is a very pleasant 59 year old African-American female with a past medical history significant for end-stage renal disease on Monday Wednesday Friday dialysis. She also has a history of breast cancer having recently underwent a right mastectomy 1 week ago. However she has had a cough for more than 3 weeks. She was initially seen and treated for rhinitis with Flonase however this did nothing to help her symptoms. She denies any rhinorrhea at the present time. She denies any sinus pain or sinus pressure. She denies any myalgias. However she does have a cough productive of yellow and green sputum. She also reports pleurisy particular on the right side where she recently had breast cancer. She denies any shortness of breath. However she is sedentary. On examination today there are no crackles in her lungs. I see no evidence of fluid overload. Past Medical History:  Diagnosis Date  . (HFpEF) heart failure with preserved ejection fraction ([place])    a. 01/2019 Echo: EF 55-60%, mild conc LVH. DD.  Torn MV chordae.  . Anemia   . Atypical chest pain    a. 08/2018 MV: EF 59%, no ischemia; b. 02/2019 Cath: nonobs dzs.  . Blood transfusion without reported diagnosis   . Cataract   . ESRD (end stage renal disease) on dialysis ([place])    a. HD T, T, S  . Essential hypertension, benign   . GERD (gastroesophageal reflux disease)   . Headache   . Hemorrhoids   . Mixed hyperlipidemia   . Morbid obesity ([place])   . Non-obstructive CAD (coronary artery disease)    a. 02/2019 CathL LM nl, LAD 63m, LCX nl, RCA 25p, EF 55-65%.  Marland Kitchen PONV (postoperative nausea and vomiting)   . S/P colonoscopy Jan 2011   Dr. Benson Norway: sessile polyp (benign lymphoid), large hemorrhoids, repeat 5-10 years  . Temporal arteritis ([employer])   . Type 2 diabetes mellitus ([place])   . Wears glasses    Past Surgical History:  Procedure  Laterality Date  . ABDOMINAL HYSTERECTOMY    . APPENDECTOMY    . ARTERY BIOPSY N/A 05/09/2018   Procedure: RIGHT TEMPORAL ARTERY BIOPSY;  Surgeon: Judeth Horn, MD;  Location: [facility];  Service: General;  Laterality: N/A;  . CATARACT EXTRACTION W/PHACO Left 02/09/2017   Procedure: CATARACT EXTRACTION PHACO AND INTRAOCULAR LENS PLACEMENT LEFT EYE;  Surgeon: Tonny Branch, MD;  [facility];  Service: Ophthalmology;  Laterality: Left;  CDE: 4.89  . CATARACT EXTRACTION W/PHACO Right 06/04/2017   Procedure: CATARACT EXTRACTION PHACO AND INTRAOCULAR LENS PLACEMENT (IOC);  Surgeon: Tonny Branch, MD;  [facility];  Service: Ophthalmology;  Laterality: Right;  CDE: 4.12  . CHOLECYSTECTOMY  09/29/2011   Procedure: LAPAROSCOPIC CHOLECYSTECTOMY;  Surgeon: Jamesetta So, MD;  [facility];  Service: General;  Laterality: N/A;  . COLONOSCOPY  Jan 2011   Dr. Benson Norway: sessile polyp (benign lymphoid), large hemorrhoids, repeat 5-10 years  . COLONOSCOPY N/A 06/12/2016   Procedure: COLONOSCOPY;  Surgeon: Daneil Dolin, MD;  [facility];  Service: Endoscopy;  Laterality: N/A;  1230   . ESOPHAGOGASTRODUODENOSCOPY  09/05/2011   PIR:JJOAC hiatal hernia; remainder of exam normal. No explanation for patient's abdominal pain with today's examination  . ESOPHAGOGASTRODUODENOSCOPY N/A 12/17/2013   Dr. [name]: gastric erythema, erosion, mild chronic inflammation on path   . LAPAROSCOPIC APPENDECTOMY  09/29/2011   Procedure:  APPENDECTOMY LAPAROSCOPIC;  Surgeon: Jamesetta So, MD;  [facility];  Service: General;;  incidental appendectomy  . LEFT HEART CATH AND CORONARY ANGIOGRAPHY N/A 02/28/2019   Procedure: LEFT HEART CATH AND CORONARY ANGIOGRAPHY;  Surgeon: Jettie Booze, MD;  Location: [facility];  Service: Cardiovascular;  Laterality: N/A;  . MASTECTOMY MODIFIED RADICAL Right 02/18/2020   Procedure: MASTECTOMY MODIFIED RADICAL;  Surgeon: Aviva Signs, MD;  [facility];  Service:  General;  Laterality: Right;  . PARTIAL MASTECTOMY WITH NEEDLE LOCALIZATION AND AXILLARY SENTINEL LYMPH NODE BX Right 09/18/2018   Procedure: RIGHT PARTIAL MASTECTOMY AFTER NEEDLE LOCALIZATION, SENTINEL LYMPH NODE BIOPSY RIGHT AXILLA;  Surgeon: Aviva Signs, MD;  [facility];  Service: General;  Laterality: Right;   Current Outpatient Medications on File Prior to Visit  Medication Sig Dispense Refill  . amLODipine (NORVASC) 10 MG tablet TAKE 2 TABLETS(20 MG) BY MOUTH AT BEDTIME (Patient taking differently: Take 10 mg by mouth at bedtime. ) 60 tablet 5  . anastrozole (ARIMIDEX) 1 MG tablet TAKE 1 TABLET(1 MG) BY MOUTH DAILY (Patient taking differently: Take 1 mg by mouth daily. ) 30 tablet 4  . aspirin EC 81 MG tablet Take 81 mg by mouth daily.    Marland Kitchen atorvastatin (LIPITOR) 20 MG tablet TAKE 1 TABLET DAILY (Patient taking differently: Take 20 mg by mouth daily. ) 90 tablet 3  . B Complex-C-Zn-Folic Acid (DIALYVITE 425-ZDGL 15) 0.8 MG TABS Take 1 tablet by mouth daily.    . cinacalcet (SENSIPAR) 30 MG tablet Take 2 tablets (60 mg total) by mouth daily. (Patient taking differently: Take 30 mg by mouth daily. ) 90 tablet 0  . DULoxetine (CYMBALTA) 30 MG capsule TAKE 1 CAPSULE(30 MG) BY MOUTH DAILY 90 capsule 1  . fluticasone (FLONASE) 50 MCG/ACT nasal spray Place 2 sprays into both nostrils daily. 16 g 6  . gabapentin (NEURONTIN) 300 MG capsule TAKE 1 CAPSULE(300 MG) BY MOUTH AT BEDTIME (Patient taking differently: Take 300 mg by mouth at bedtime. ) 90 capsule 3  . lanthanum (FOSRENOL) 1000 MG chewable tablet Chew 1,000-2,000 mg by mouth See admin instructions. Take 2000 mg by mouth with meals and 1000 mg with snacks    . lidocaine-prilocaine (EMLA) cream Apply 1 application topically every Monday, Wednesday, and Friday with hemodialysis.     Marland Kitchen nebivolol (BYSTOLIC) 10 MG tablet Take 1 tablet (10 mg total) by mouth daily. (Patient taking differently: Take 10 mg by mouth in the morning and at bedtime. )  90 tablet 3  . oxyCODONE (ROXICODONE) 5 MG immediate release tablet Take 1 tablet (5 mg total) by mouth every 6 (six) hours as needed. 25 tablet 0  . pantoprazole (PROTONIX) 40 MG tablet TAKE 1 TABLET(40 MG) BY MOUTH DAILY (Patient taking differently: Take 40 mg by mouth daily. ) 90 tablet 3   No current facility-administered medications on file prior to visit.   Allergies  Allergen Reactions  . Hydrocodone Itching   Social History   Socioeconomic History  . Marital status: Married    Spouse name: Not on file  . Number of children: Not on file  . Years of education: Not on file  . Highest education level: Not on file  Occupational History  . 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[id number]  Tobacco Use  . Smoking status: Never Smoker  . Smokeless tobacco: Never Used  Vaping Use  . Vaping Use: Never used  Substance and Sexual Activity  .  Alcohol use: No  . Drug use: No  . Sexual activity: Yes    Birth control/protection: Surgical  Other Topics Concern  . Not on file  Social History Narrative   Works at [employer] in [place].    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   Also has to get items from high shelves-causes achy pain in shoulder area      Married.   Children are grown, out of house.   Social Determinants of Health   Financial Resource Strain:   . Difficulty of Paying Living Expenses:   Food Insecurity:   . Worried About Charity fundraiser in the Last Year:   . Arboriculturist in the Last Year:   Transportation Needs:   . Film/video editor (Medical):   Marland Kitchen Lack of Transportation (Non-Medical):   Physical Activity:   . Days of Exercise per Week:   . Minutes of Exercise per Session:   Stress:   . Feeling of Stress :   Social Connections:   . Frequency of Communication with Friends and Family:   . Frequency of Social Gatherings with Friends and Family:   . Attends Religious Services:   . Active Member of Clubs or Organizations:   .  Attends Archivist Meetings:   Marland Kitchen Marital Status:   Intimate Partner Violence:   . Fear of Current or Ex-Partner:   . Emotionally Abused:   Marland Kitchen Physically Abused:   . Sexually Abused:     Past Medical History:  Diagnosis Date  . (HFpEF) heart failure with preserved ejection fraction ([place])    a. 01/2019 Echo: EF 55-60%, mild conc LVH. DD.  Torn MV chordae.  . Anemia   . Atypical chest pain    a. 08/2018 MV: EF 59%, no ischemia; b. 02/2019 Cath: nonobs dzs.  . Blood transfusion without reported diagnosis   . Cataract   . ESRD (end stage renal disease) on dialysis ([place])    a. HD T, T, S  . Essential hypertension, benign   . GERD (gastroesophageal reflux disease)   . Headache   . Hemorrhoids   . Mixed hyperlipidemia   . Morbid obesity ([place])   . Non-obstructive CAD (coronary artery disease)    a. 02/2019 CathL LM nl, LAD 15m, LCX nl, RCA 25p, EF 55-65%.  Marland Kitchen PONV (postoperative nausea and vomiting)   . S/P colonoscopy Jan 2011   Dr. Benson Norway: sessile polyp (benign lymphoid), large hemorrhoids, repeat 5-10 years  . Temporal arteritis ([place])   . Type 2 diabetes mellitus ([place])   . Wears glasses    Past Surgical History:  Procedure Laterality Date  . ABDOMINAL HYSTERECTOMY    . APPENDECTOMY    . ARTERY BIOPSY N/A 05/09/2018   Procedure: RIGHT TEMPORAL ARTERY BIOPSY;  Surgeon: Judeth Horn, MD;  Location: [facility];  Service: General;  Laterality: N/A;  . CATARACT EXTRACTION W/PHACO Left 02/09/2017   Procedure: CATARACT EXTRACTION PHACO AND INTRAOCULAR LENS PLACEMENT LEFT EYE;  Surgeon: Tonny Branch, MD;  [facility];  Service: Ophthalmology;  Laterality: Left;  CDE: 4.89  . CATARACT EXTRACTION W/PHACO Right 06/04/2017   Procedure: CATARACT EXTRACTION PHACO AND INTRAOCULAR LENS PLACEMENT (IOC);  Surgeon: Tonny Branch, MD;  [facility];  Service: Ophthalmology;  Laterality: Right;  CDE: 4.12  . CHOLECYSTECTOMY  09/29/2011   Procedure: LAPAROSCOPIC CHOLECYSTECTOMY;  Surgeon: Jamesetta So, MD;  [facility];  Service: General;  Laterality: N/A;  .  COLONOSCOPY  Jan 2011   Dr. Benson Norway: sessile polyp (benign lymphoid), large hemorrhoids, repeat 5-10 years  . COLONOSCOPY N/A 06/12/2016   Procedure: COLONOSCOPY;  Surgeon: Daneil Dolin, MD;  [facility];  Service: Endoscopy;  Laterality: N/A;  1230   . ESOPHAGOGASTRODUODENOSCOPY  09/05/2011   WEX:HBZJI hiatal hernia; remainder of exam normal. No explanation for patient's abdominal pain with today's examination  . ESOPHAGOGASTRODUODENOSCOPY N/A 12/17/2013   Dr. [name]: gastric erythema, erosion, mild chronic inflammation on path   . LAPAROSCOPIC APPENDECTOMY  09/29/2011   Procedure: APPENDECTOMY LAPAROSCOPIC;  Surgeon: Jamesetta So, MD;  [facility];  Service: General;;  incidental appendectomy  . LEFT HEART CATH AND CORONARY ANGIOGRAPHY N/A 02/28/2019   Procedure: LEFT HEART CATH AND CORONARY ANGIOGRAPHY;  Surgeon: Jettie Booze, MD;  Location: [facility];  Service: Cardiovascular;  Laterality: N/A;  . MASTECTOMY MODIFIED RADICAL Right 02/18/2020   Procedure: MASTECTOMY MODIFIED RADICAL;  Surgeon: Aviva Signs, MD;  [facility];  Service: General;  Laterality: Right;  . PARTIAL MASTECTOMY WITH NEEDLE LOCALIZATION AND AXILLARY SENTINEL LYMPH NODE BX Right 09/18/2018   Procedure: RIGHT PARTIAL MASTECTOMY AFTER NEEDLE LOCALIZATION, SENTINEL LYMPH NODE BIOPSY RIGHT AXILLA;  Surgeon: Aviva Signs, MD;  [facility];  Service: General;  Laterality: Right;   Current Outpatient Medications on File Prior to Visit  Medication Sig Dispense Refill  . amLODipine (NORVASC) 10 MG tablet TAKE 2 TABLETS(20 MG) BY MOUTH AT BEDTIME (Patient taking differently: Take 10 mg by mouth at bedtime. ) 60 tablet 5  . anastrozole (ARIMIDEX) 1 MG tablet TAKE 1 TABLET(1 MG) BY MOUTH DAILY (Patient taking differently: Take 1 mg by mouth daily. ) 30 tablet 4  . aspirin EC 81 MG tablet Take 81 mg by mouth daily.    Marland Kitchen  atorvastatin (LIPITOR) 20 MG tablet TAKE 1 TABLET DAILY (Patient taking differently: Take 20 mg by mouth daily. ) 90 tablet 3  . B Complex-C-Zn-Folic Acid (DIALYVITE 967-ELFY 15) 0.8 MG TABS Take 1 tablet by mouth daily.    . cinacalcet (SENSIPAR) 30 MG tablet Take 2 tablets (60 mg total) by mouth daily. (Patient taking differently: Take 30 mg by mouth daily. ) 90 tablet 0  . DULoxetine (CYMBALTA) 30 MG capsule TAKE 1 CAPSULE(30 MG) BY MOUTH DAILY 90 capsule 1  . fluticasone (FLONASE) 50 MCG/ACT nasal spray Place 2 sprays into both nostrils daily. 16 g 6  . gabapentin (NEURONTIN) 300 MG capsule TAKE 1 CAPSULE(300 MG) BY MOUTH AT BEDTIME (Patient taking differently: Take 300 mg by mouth at bedtime. ) 90 capsule 3  . lanthanum (FOSRENOL) 1000 MG chewable tablet Chew 1,000-2,000 mg by mouth See admin instructions. Take 2000 mg by mouth with meals and 1000 mg with snacks    . lidocaine-prilocaine (EMLA) cream Apply 1 application topically every Monday, Wednesday, and Friday with hemodialysis.     Marland Kitchen nebivolol (BYSTOLIC) 10 MG tablet Take 1 tablet (10 mg total) by mouth daily. (Patient taking differently: Take 10 mg by mouth in the morning and at bedtime. ) 90 tablet 3  . oxyCODONE (ROXICODONE) 5 MG immediate release tablet Take 1 tablet (5 mg total) by mouth every 6 (six) hours as needed. 25 tablet 0  . pantoprazole (PROTONIX) 40 MG tablet TAKE 1 TABLET(40 MG) BY MOUTH DAILY (Patient taking differently: Take 40 mg by mouth daily. ) 90 tablet 3   No current facility-administered medications on file prior to visit.   Allergies  Allergen Reactions  .  Hydrocodone Itching   Social History   Socioeconomic History  . Marital status: Married    Spouse name: Not on file  . Number of children: Not on file  . Years of education: Not on file  . Highest education level: Not on file  Occupational History  . 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[phone]  Tobacco Use  . Smoking status: Never Smoker   . Smokeless tobacco: Never Used  Vaping Use  . Vaping Use: Never used  Substance and Sexual Activity  . Alcohol use: No  . Drug use: No  . Sexual activity: Yes    Birth control/protection: Surgical  Other Topics Concern  . Not on file  Social History Narrative   Works at [employer] in [place].    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   Also has to get items from high shelves-causes achy pain in shoulder area      Married.   Children are grown, out of house.   Social Determinants of Health   Financial Resource Strain:   . Difficulty of Paying Living Expenses:   Food Insecurity:   . Worried About Charity fundraiser in the Last Year:   . Arboriculturist in the Last Year:   Transportation Needs:   . Film/video editor (Medical):   Marland Kitchen Lack of Transportation (Non-Medical):   Physical Activity:   . Days of Exercise per Week:   . Minutes of Exercise per Session:   Stress:   . Feeling of Stress :   Social Connections:   . Frequency of Communication with Friends and Family:   . Frequency of Social Gatherings with Friends and Family:   . Attends Religious Services:   . Active Member of Clubs or Organizations:   . Attends Archivist Meetings:   Marland Kitchen Marital Status:   Intimate Partner Violence:   . Fear of Current or Ex-Partner:   . Emotionally Abused:   Marland Kitchen Physically Abused:   . Sexually Abused:       Review of Systems  Respiratory: Positive for cough.   All other systems reviewed and are negative.      Objective:   Physical Exam Vitals reviewed.  Constitutional:      General: She is not in acute distress.    Appearance: Normal appearance. She is normal weight.  Cardiovascular:     Rate and Rhythm: Normal rate and regular rhythm.     Pulses: Normal pulses.     Heart sounds: Murmur (thrill from AV fistual heard as murmur) heard.   Pulmonary:     Effort: Pulmonary effort is normal. No respiratory distress.     Breath sounds: Normal  breath sounds. No stridor. No wheezing, rhonchi or rales.  Chest:     Chest wall: No tenderness.  Musculoskeletal:     Right lower leg: No edema.     Left lower leg: No edema.  Neurological:     Mental Status: She is alert.           Assessment & Plan:  Acute bronchitis, unspecified organism  Symptoms sound like bronchitis. I explained to the patient this is usually viral. She is had the symptoms now for more than 3 weeks. My biggest concern is her compromised immune status given the fact she is recently had surgery for breast cancer, she was in the hospital for this, and she is on dialysis 3 times a week.  Therefore I will start her on doxycycline to cover for possible bacterial bronchitis 100 mg p.o. twice daily for 7 days and use Hycodan 1 teaspoon every 8 hours as needed for cough. If cough is no better in 1 week proceed with chest x-ray. I see no evidence of pulmonary edema causing the cough today on exam

## 2020-03-11 NOTE — Telephone Encounter (Signed)
I think that would be fine.

## 2020-03-11 NOTE — Telephone Encounter (Signed)
Pt husband called and wanted to know if she could travel out of the state, and be around small children with breathing problems?

## 2020-03-12 DIAGNOSIS — N186 End stage renal disease: Secondary | ICD-10-CM | POA: Diagnosis not present

## 2020-03-12 DIAGNOSIS — N2581 Secondary hyperparathyroidism of renal origin: Secondary | ICD-10-CM | POA: Diagnosis not present

## 2020-03-12 DIAGNOSIS — D509 Iron deficiency anemia, unspecified: Secondary | ICD-10-CM | POA: Diagnosis not present

## 2020-03-12 DIAGNOSIS — E1129 Type 2 diabetes mellitus with other diabetic kidney complication: Secondary | ICD-10-CM | POA: Diagnosis not present

## 2020-03-12 DIAGNOSIS — D631 Anemia in chronic kidney disease: Secondary | ICD-10-CM | POA: Diagnosis not present

## 2020-03-12 DIAGNOSIS — Z992 Dependence on renal dialysis: Secondary | ICD-10-CM | POA: Diagnosis not present

## 2020-03-12 NOTE — Telephone Encounter (Signed)
Spoke with Pt to let her know provider said it should be fine for Mrs. Bonello to travel out of the state with family.

## 2020-03-14 DIAGNOSIS — Z992 Dependence on renal dialysis: Secondary | ICD-10-CM | POA: Diagnosis not present

## 2020-03-14 DIAGNOSIS — N186 End stage renal disease: Secondary | ICD-10-CM | POA: Diagnosis not present

## 2020-03-14 DIAGNOSIS — N040 Nephrotic syndrome with minor glomerular abnormality: Secondary | ICD-10-CM | POA: Diagnosis not present

## 2020-03-15 ENCOUNTER — Telehealth: Payer: Self-pay | Admitting: Family Medicine

## 2020-03-15 ENCOUNTER — Other Ambulatory Visit: Payer: Self-pay

## 2020-03-15 DIAGNOSIS — E1129 Type 2 diabetes mellitus with other diabetic kidney complication: Secondary | ICD-10-CM | POA: Diagnosis not present

## 2020-03-15 DIAGNOSIS — N2581 Secondary hyperparathyroidism of renal origin: Secondary | ICD-10-CM | POA: Diagnosis not present

## 2020-03-15 DIAGNOSIS — D509 Iron deficiency anemia, unspecified: Secondary | ICD-10-CM | POA: Diagnosis not present

## 2020-03-15 DIAGNOSIS — J209 Acute bronchitis, unspecified: Secondary | ICD-10-CM

## 2020-03-15 DIAGNOSIS — Z992 Dependence on renal dialysis: Secondary | ICD-10-CM | POA: Diagnosis not present

## 2020-03-15 DIAGNOSIS — E119 Type 2 diabetes mellitus without complications: Secondary | ICD-10-CM | POA: Diagnosis not present

## 2020-03-15 DIAGNOSIS — N186 End stage renal disease: Secondary | ICD-10-CM | POA: Diagnosis not present

## 2020-03-15 DIAGNOSIS — D631 Anemia in chronic kidney disease: Secondary | ICD-10-CM | POA: Diagnosis not present

## 2020-03-15 NOTE — Telephone Encounter (Signed)
Pt was seen last week on Thursday and cough is not getting any better. Dr Dennard Schaumann said if the cough was not getting any better he would order a chest X-Ray   Pt call [phone]

## 2020-03-16 ENCOUNTER — Telehealth: Payer: Self-pay

## 2020-03-16 NOTE — Telephone Encounter (Signed)
Megan [name] husband called about her having an xray, order was placed for [name] by another nurse on 03/15/2020

## 2020-03-17 ENCOUNTER — Ambulatory Visit (HOSPITAL_COMMUNITY)
Admission: RE | Admit: 2020-03-17 | Discharge: 2020-03-17 | Disposition: A | Discharge status: Home / Self Care | Payer: Medicare Other | Source: Ambulatory Visit | Attending: Family Medicine | Admitting: Family Medicine

## 2020-03-17 ENCOUNTER — Other Ambulatory Visit: Payer: Self-pay

## 2020-03-17 DIAGNOSIS — E1129 Type 2 diabetes mellitus with other diabetic kidney complication: Secondary | ICD-10-CM | POA: Diagnosis not present

## 2020-03-17 DIAGNOSIS — R05 Cough: Secondary | ICD-10-CM | POA: Diagnosis not present

## 2020-03-17 DIAGNOSIS — Z992 Dependence on renal dialysis: Secondary | ICD-10-CM | POA: Diagnosis not present

## 2020-03-17 DIAGNOSIS — N2581 Secondary hyperparathyroidism of renal origin: Secondary | ICD-10-CM | POA: Diagnosis not present

## 2020-03-17 DIAGNOSIS — D509 Iron deficiency anemia, unspecified: Secondary | ICD-10-CM | POA: Diagnosis not present

## 2020-03-17 DIAGNOSIS — J209 Acute bronchitis, unspecified: Secondary | ICD-10-CM | POA: Insufficient documentation

## 2020-03-17 DIAGNOSIS — D631 Anemia in chronic kidney disease: Secondary | ICD-10-CM | POA: Diagnosis not present

## 2020-03-17 DIAGNOSIS — N186 End stage renal disease: Secondary | ICD-10-CM | POA: Diagnosis not present

## 2020-03-18 ENCOUNTER — Other Ambulatory Visit: Payer: Self-pay | Admitting: Family Medicine

## 2020-03-19 ENCOUNTER — Ambulatory Visit: Payer: Medicare Other | Admitting: Gastroenterology

## 2020-03-19 ENCOUNTER — Other Ambulatory Visit: Payer: Self-pay | Admitting: Family Medicine

## 2020-03-19 ENCOUNTER — Telehealth: Payer: Self-pay

## 2020-03-19 DIAGNOSIS — D509 Iron deficiency anemia, unspecified: Secondary | ICD-10-CM | POA: Diagnosis not present

## 2020-03-19 DIAGNOSIS — N186 End stage renal disease: Secondary | ICD-10-CM | POA: Diagnosis not present

## 2020-03-19 DIAGNOSIS — Z992 Dependence on renal dialysis: Secondary | ICD-10-CM | POA: Diagnosis not present

## 2020-03-19 DIAGNOSIS — D631 Anemia in chronic kidney disease: Secondary | ICD-10-CM | POA: Diagnosis not present

## 2020-03-19 DIAGNOSIS — N2581 Secondary hyperparathyroidism of renal origin: Secondary | ICD-10-CM | POA: Diagnosis not present

## 2020-03-19 DIAGNOSIS — E1129 Type 2 diabetes mellitus with other diabetic kidney complication: Secondary | ICD-10-CM | POA: Diagnosis not present

## 2020-03-19 MED ORDER — HYDROCODONE-HOMATROPINE 5-1.5 MG/5ML PO SYRP: 5.0000 mL | ORAL_SOLUTION | Freq: Three times a day (TID) | ORAL | 0 refills | Status: DC | PRN

## 2020-03-19 NOTE — Telephone Encounter (Signed)
Pt is still coughing, she wants to know if she can try some prescription cough medication?

## 2020-03-19 NOTE — Telephone Encounter (Signed)
I sent in hycodan

## 2020-03-22 ENCOUNTER — Telehealth: Payer: Self-pay | Admitting: *Deleted

## 2020-03-22 ENCOUNTER — Other Ambulatory Visit: Payer: Self-pay | Admitting: Family Medicine

## 2020-03-22 DIAGNOSIS — Z992 Dependence on renal dialysis: Secondary | ICD-10-CM | POA: Diagnosis not present

## 2020-03-22 DIAGNOSIS — N2581 Secondary hyperparathyroidism of renal origin: Secondary | ICD-10-CM | POA: Diagnosis not present

## 2020-03-22 DIAGNOSIS — E1129 Type 2 diabetes mellitus with other diabetic kidney complication: Secondary | ICD-10-CM | POA: Diagnosis not present

## 2020-03-22 DIAGNOSIS — N186 End stage renal disease: Secondary | ICD-10-CM | POA: Diagnosis not present

## 2020-03-22 DIAGNOSIS — D509 Iron deficiency anemia, unspecified: Secondary | ICD-10-CM | POA: Diagnosis not present

## 2020-03-22 DIAGNOSIS — D631 Anemia in chronic kidney disease: Secondary | ICD-10-CM | POA: Diagnosis not present

## 2020-03-22 MED ORDER — BENZONATATE 200 MG PO CAPS
200.0000 mg | ORAL_CAPSULE | Freq: Two times a day (BID) | ORAL | 0 refills | Status: DC | PRN
Start: 2020-03-22 — End: 2020-07-13

## 2020-03-22 NOTE — Telephone Encounter (Signed)
Last seen 06/05/19

## 2020-03-22 NOTE — Telephone Encounter (Signed)
Pt.notified

## 2020-03-22 NOTE — Telephone Encounter (Signed)
Pt called because she states she called last Thursday and has not heard back. Was seen for cough and given cough syrup and antibiotic. Has finished both and still having a hacking cough. No fever or sob. Wanted to try tessalon pearls for cough because she states the hycodan did not help much. I told her hycodan was sent in last Friday but I would send a note back to dr since she wanted to try tessalon pearls.   [facility].

## 2020-03-22 NOTE — Telephone Encounter (Signed)
I calld in tessalon.

## 2020-03-24 DIAGNOSIS — Z992 Dependence on renal dialysis: Secondary | ICD-10-CM | POA: Diagnosis not present

## 2020-03-24 DIAGNOSIS — N186 End stage renal disease: Secondary | ICD-10-CM | POA: Diagnosis not present

## 2020-03-24 DIAGNOSIS — D509 Iron deficiency anemia, unspecified: Secondary | ICD-10-CM | POA: Diagnosis not present

## 2020-03-24 DIAGNOSIS — E1129 Type 2 diabetes mellitus with other diabetic kidney complication: Secondary | ICD-10-CM | POA: Diagnosis not present

## 2020-03-24 DIAGNOSIS — D631 Anemia in chronic kidney disease: Secondary | ICD-10-CM | POA: Diagnosis not present

## 2020-03-24 DIAGNOSIS — N2581 Secondary hyperparathyroidism of renal origin: Secondary | ICD-10-CM | POA: Diagnosis not present

## 2020-03-26 DIAGNOSIS — N2581 Secondary hyperparathyroidism of renal origin: Secondary | ICD-10-CM | POA: Diagnosis not present

## 2020-03-26 DIAGNOSIS — D509 Iron deficiency anemia, unspecified: Secondary | ICD-10-CM | POA: Diagnosis not present

## 2020-03-26 DIAGNOSIS — N186 End stage renal disease: Secondary | ICD-10-CM | POA: Diagnosis not present

## 2020-03-26 DIAGNOSIS — D631 Anemia in chronic kidney disease: Secondary | ICD-10-CM | POA: Diagnosis not present

## 2020-03-26 DIAGNOSIS — Z992 Dependence on renal dialysis: Secondary | ICD-10-CM | POA: Diagnosis not present

## 2020-03-26 DIAGNOSIS — E1129 Type 2 diabetes mellitus with other diabetic kidney complication: Secondary | ICD-10-CM | POA: Diagnosis not present

## 2020-03-29 DIAGNOSIS — D509 Iron deficiency anemia, unspecified: Secondary | ICD-10-CM | POA: Diagnosis not present

## 2020-03-29 DIAGNOSIS — N186 End stage renal disease: Secondary | ICD-10-CM | POA: Diagnosis not present

## 2020-03-29 DIAGNOSIS — E1129 Type 2 diabetes mellitus with other diabetic kidney complication: Secondary | ICD-10-CM | POA: Diagnosis not present

## 2020-03-29 DIAGNOSIS — N2581 Secondary hyperparathyroidism of renal origin: Secondary | ICD-10-CM | POA: Diagnosis not present

## 2020-03-29 DIAGNOSIS — Z992 Dependence on renal dialysis: Secondary | ICD-10-CM | POA: Diagnosis not present

## 2020-03-29 DIAGNOSIS — D631 Anemia in chronic kidney disease: Secondary | ICD-10-CM | POA: Diagnosis not present

## 2020-03-30 ENCOUNTER — Ambulatory Visit: Payer: Medicare Other | Admitting: Family Medicine

## 2020-03-31 DIAGNOSIS — N186 End stage renal disease: Secondary | ICD-10-CM | POA: Diagnosis not present

## 2020-03-31 DIAGNOSIS — D509 Iron deficiency anemia, unspecified: Secondary | ICD-10-CM | POA: Diagnosis not present

## 2020-03-31 DIAGNOSIS — Z992 Dependence on renal dialysis: Secondary | ICD-10-CM | POA: Diagnosis not present

## 2020-03-31 DIAGNOSIS — N2581 Secondary hyperparathyroidism of renal origin: Secondary | ICD-10-CM | POA: Diagnosis not present

## 2020-03-31 DIAGNOSIS — E1129 Type 2 diabetes mellitus with other diabetic kidney complication: Secondary | ICD-10-CM | POA: Diagnosis not present

## 2020-03-31 DIAGNOSIS — D631 Anemia in chronic kidney disease: Secondary | ICD-10-CM | POA: Diagnosis not present

## 2020-04-01 ENCOUNTER — Ambulatory Visit (INDEPENDENT_AMBULATORY_CARE_PROVIDER_SITE_OTHER): Payer: Medicare Other | Admitting: General Surgery

## 2020-04-01 ENCOUNTER — Encounter: Payer: Self-pay | Admitting: General Surgery

## 2020-04-01 ENCOUNTER — Other Ambulatory Visit: Payer: Self-pay

## 2020-04-01 VITALS — BP 148/79 | HR 80 | Temp 98.5°F | Resp 12 | Ht 62.0 in | Wt 176.0 lb

## 2020-04-01 DIAGNOSIS — Z09 Encounter for follow-up examination after completed treatment for conditions other than malignant neoplasm: Secondary | ICD-10-CM

## 2020-04-01 NOTE — Progress Notes (Signed)
Subjective:     Megan [name]  Patient here for postoperative visit. Wants to proceed with a left prophylactic mastectomy due to her increased risk of breast cancer and her desire to have the external appearance of matching her right mastectomy. She had seen Dr. Elisabeth Cara of plastic surgery preoperatively was not a candidate for any implant procedure. Objective:    BP (!) 148/79   Pulse 80   Temp 98.5 F (36.9 C) (Oral)   Resp 12   Ht 5\' 2"  (1.575 m)   Wt 176 lb (79.8 kg)   SpO2 94%   BMI 32.19 kg/m   General:  alert, cooperative and no distress  Breast examination reveals some excess flap tissue on the right mastectomy site. Left breast is pendulous and much larger.     Assessment:    Doing well postoperatively. Desire for left prophylactic mastectomy    Plan:   I told the patient that be more than happy to do a left simple prophylactic mastectomy. Due to Covid, we have to delay the procedure until later this year due to operating room/admitting restrictions. She understands this and will return in October to possibly schedule surgery.

## 2020-04-02 DIAGNOSIS — N2581 Secondary hyperparathyroidism of renal origin: Secondary | ICD-10-CM | POA: Diagnosis not present

## 2020-04-02 DIAGNOSIS — N186 End stage renal disease: Secondary | ICD-10-CM | POA: Diagnosis not present

## 2020-04-02 DIAGNOSIS — D631 Anemia in chronic kidney disease: Secondary | ICD-10-CM | POA: Diagnosis not present

## 2020-04-02 DIAGNOSIS — Z992 Dependence on renal dialysis: Secondary | ICD-10-CM | POA: Diagnosis not present

## 2020-04-02 DIAGNOSIS — E1129 Type 2 diabetes mellitus with other diabetic kidney complication: Secondary | ICD-10-CM | POA: Diagnosis not present

## 2020-04-02 DIAGNOSIS — D509 Iron deficiency anemia, unspecified: Secondary | ICD-10-CM | POA: Diagnosis not present

## 2020-04-06 ENCOUNTER — Telehealth: Payer: Self-pay

## 2020-04-06 NOTE — Telephone Encounter (Signed)
Recommend Imodium over-the-counter for diarrhea

## 2020-04-06 NOTE — Telephone Encounter (Signed)
Megan [name] has diarrhea and vomiting for the last 24hr, she wants to know if something could be sent in to stop the diarrhea. She hasn't been able to keep fluids down, she's going to try some Pedialyte.

## 2020-04-06 NOTE — Telephone Encounter (Signed)
Pt is feeling better, and will try the Imodium over the counter.

## 2020-04-07 DIAGNOSIS — Z992 Dependence on renal dialysis: Secondary | ICD-10-CM | POA: Diagnosis not present

## 2020-04-07 DIAGNOSIS — N2581 Secondary hyperparathyroidism of renal origin: Secondary | ICD-10-CM | POA: Diagnosis not present

## 2020-04-07 DIAGNOSIS — D509 Iron deficiency anemia, unspecified: Secondary | ICD-10-CM | POA: Diagnosis not present

## 2020-04-07 DIAGNOSIS — D631 Anemia in chronic kidney disease: Secondary | ICD-10-CM | POA: Diagnosis not present

## 2020-04-07 DIAGNOSIS — E1129 Type 2 diabetes mellitus with other diabetic kidney complication: Secondary | ICD-10-CM | POA: Diagnosis not present

## 2020-04-07 DIAGNOSIS — N186 End stage renal disease: Secondary | ICD-10-CM | POA: Diagnosis not present

## 2020-04-09 DIAGNOSIS — N2581 Secondary hyperparathyroidism of renal origin: Secondary | ICD-10-CM | POA: Diagnosis not present

## 2020-04-09 DIAGNOSIS — E1129 Type 2 diabetes mellitus with other diabetic kidney complication: Secondary | ICD-10-CM | POA: Diagnosis not present

## 2020-04-09 DIAGNOSIS — Z992 Dependence on renal dialysis: Secondary | ICD-10-CM | POA: Diagnosis not present

## 2020-04-09 DIAGNOSIS — N186 End stage renal disease: Secondary | ICD-10-CM | POA: Diagnosis not present

## 2020-04-09 DIAGNOSIS — D631 Anemia in chronic kidney disease: Secondary | ICD-10-CM | POA: Diagnosis not present

## 2020-04-09 DIAGNOSIS — D509 Iron deficiency anemia, unspecified: Secondary | ICD-10-CM | POA: Diagnosis not present

## 2020-04-12 DIAGNOSIS — N186 End stage renal disease: Secondary | ICD-10-CM | POA: Diagnosis not present

## 2020-04-12 DIAGNOSIS — E1129 Type 2 diabetes mellitus with other diabetic kidney complication: Secondary | ICD-10-CM | POA: Diagnosis not present

## 2020-04-12 DIAGNOSIS — D509 Iron deficiency anemia, unspecified: Secondary | ICD-10-CM | POA: Diagnosis not present

## 2020-04-12 DIAGNOSIS — D631 Anemia in chronic kidney disease: Secondary | ICD-10-CM | POA: Diagnosis not present

## 2020-04-12 DIAGNOSIS — N2581 Secondary hyperparathyroidism of renal origin: Secondary | ICD-10-CM | POA: Diagnosis not present

## 2020-04-12 DIAGNOSIS — Z992 Dependence on renal dialysis: Secondary | ICD-10-CM | POA: Diagnosis not present

## 2020-04-14 DIAGNOSIS — Z23 Encounter for immunization: Secondary | ICD-10-CM | POA: Diagnosis not present

## 2020-04-14 DIAGNOSIS — N2581 Secondary hyperparathyroidism of renal origin: Secondary | ICD-10-CM | POA: Diagnosis not present

## 2020-04-14 DIAGNOSIS — D509 Iron deficiency anemia, unspecified: Secondary | ICD-10-CM | POA: Diagnosis not present

## 2020-04-14 DIAGNOSIS — N040 Nephrotic syndrome with minor glomerular abnormality: Secondary | ICD-10-CM | POA: Diagnosis not present

## 2020-04-14 DIAGNOSIS — E119 Type 2 diabetes mellitus without complications: Secondary | ICD-10-CM | POA: Diagnosis not present

## 2020-04-14 DIAGNOSIS — E1129 Type 2 diabetes mellitus with other diabetic kidney complication: Secondary | ICD-10-CM | POA: Diagnosis not present

## 2020-04-14 DIAGNOSIS — Z992 Dependence on renal dialysis: Secondary | ICD-10-CM | POA: Diagnosis not present

## 2020-04-14 DIAGNOSIS — N186 End stage renal disease: Secondary | ICD-10-CM | POA: Diagnosis not present

## 2020-04-14 DIAGNOSIS — D631 Anemia in chronic kidney disease: Secondary | ICD-10-CM | POA: Diagnosis not present

## 2020-04-16 DIAGNOSIS — E1129 Type 2 diabetes mellitus with other diabetic kidney complication: Secondary | ICD-10-CM | POA: Diagnosis not present

## 2020-04-16 DIAGNOSIS — N2581 Secondary hyperparathyroidism of renal origin: Secondary | ICD-10-CM | POA: Diagnosis not present

## 2020-04-16 DIAGNOSIS — N186 End stage renal disease: Secondary | ICD-10-CM | POA: Diagnosis not present

## 2020-04-16 DIAGNOSIS — D631 Anemia in chronic kidney disease: Secondary | ICD-10-CM | POA: Diagnosis not present

## 2020-04-16 DIAGNOSIS — D509 Iron deficiency anemia, unspecified: Secondary | ICD-10-CM | POA: Diagnosis not present

## 2020-04-16 DIAGNOSIS — Z992 Dependence on renal dialysis: Secondary | ICD-10-CM | POA: Diagnosis not present

## 2020-04-19 DIAGNOSIS — E1129 Type 2 diabetes mellitus with other diabetic kidney complication: Secondary | ICD-10-CM | POA: Diagnosis not present

## 2020-04-19 DIAGNOSIS — N2581 Secondary hyperparathyroidism of renal origin: Secondary | ICD-10-CM | POA: Diagnosis not present

## 2020-04-19 DIAGNOSIS — Z992 Dependence on renal dialysis: Secondary | ICD-10-CM | POA: Diagnosis not present

## 2020-04-19 DIAGNOSIS — D509 Iron deficiency anemia, unspecified: Secondary | ICD-10-CM | POA: Diagnosis not present

## 2020-04-19 DIAGNOSIS — D631 Anemia in chronic kidney disease: Secondary | ICD-10-CM | POA: Diagnosis not present

## 2020-04-19 DIAGNOSIS — N186 End stage renal disease: Secondary | ICD-10-CM | POA: Diagnosis not present

## 2020-04-21 DIAGNOSIS — Z992 Dependence on renal dialysis: Secondary | ICD-10-CM | POA: Diagnosis not present

## 2020-04-21 DIAGNOSIS — N186 End stage renal disease: Secondary | ICD-10-CM | POA: Diagnosis not present

## 2020-04-21 DIAGNOSIS — N2581 Secondary hyperparathyroidism of renal origin: Secondary | ICD-10-CM | POA: Diagnosis not present

## 2020-04-21 DIAGNOSIS — E1129 Type 2 diabetes mellitus with other diabetic kidney complication: Secondary | ICD-10-CM | POA: Diagnosis not present

## 2020-04-21 DIAGNOSIS — D509 Iron deficiency anemia, unspecified: Secondary | ICD-10-CM | POA: Diagnosis not present

## 2020-04-21 DIAGNOSIS — D631 Anemia in chronic kidney disease: Secondary | ICD-10-CM | POA: Diagnosis not present

## 2020-04-23 DIAGNOSIS — Z992 Dependence on renal dialysis: Secondary | ICD-10-CM | POA: Diagnosis not present

## 2020-04-23 DIAGNOSIS — E1129 Type 2 diabetes mellitus with other diabetic kidney complication: Secondary | ICD-10-CM | POA: Diagnosis not present

## 2020-04-23 DIAGNOSIS — N186 End stage renal disease: Secondary | ICD-10-CM | POA: Diagnosis not present

## 2020-04-23 DIAGNOSIS — N2581 Secondary hyperparathyroidism of renal origin: Secondary | ICD-10-CM | POA: Diagnosis not present

## 2020-04-23 DIAGNOSIS — D631 Anemia in chronic kidney disease: Secondary | ICD-10-CM | POA: Diagnosis not present

## 2020-04-23 DIAGNOSIS — D509 Iron deficiency anemia, unspecified: Secondary | ICD-10-CM | POA: Diagnosis not present

## 2020-04-26 DIAGNOSIS — D631 Anemia in chronic kidney disease: Secondary | ICD-10-CM | POA: Diagnosis not present

## 2020-04-26 DIAGNOSIS — D509 Iron deficiency anemia, unspecified: Secondary | ICD-10-CM | POA: Diagnosis not present

## 2020-04-26 DIAGNOSIS — N186 End stage renal disease: Secondary | ICD-10-CM | POA: Diagnosis not present

## 2020-04-26 DIAGNOSIS — N2581 Secondary hyperparathyroidism of renal origin: Secondary | ICD-10-CM | POA: Diagnosis not present

## 2020-04-26 DIAGNOSIS — E1129 Type 2 diabetes mellitus with other diabetic kidney complication: Secondary | ICD-10-CM | POA: Diagnosis not present

## 2020-04-26 DIAGNOSIS — Z992 Dependence on renal dialysis: Secondary | ICD-10-CM | POA: Diagnosis not present

## 2020-04-28 DIAGNOSIS — D509 Iron deficiency anemia, unspecified: Secondary | ICD-10-CM | POA: Diagnosis not present

## 2020-04-28 DIAGNOSIS — D631 Anemia in chronic kidney disease: Secondary | ICD-10-CM | POA: Diagnosis not present

## 2020-04-28 DIAGNOSIS — Z992 Dependence on renal dialysis: Secondary | ICD-10-CM | POA: Diagnosis not present

## 2020-04-28 DIAGNOSIS — E1129 Type 2 diabetes mellitus with other diabetic kidney complication: Secondary | ICD-10-CM | POA: Diagnosis not present

## 2020-04-28 DIAGNOSIS — N186 End stage renal disease: Secondary | ICD-10-CM | POA: Diagnosis not present

## 2020-04-28 DIAGNOSIS — N2581 Secondary hyperparathyroidism of renal origin: Secondary | ICD-10-CM | POA: Diagnosis not present

## 2020-04-29 ENCOUNTER — Other Ambulatory Visit (HOSPITAL_COMMUNITY): Payer: Self-pay | Admitting: Nurse Practitioner

## 2020-04-29 ENCOUNTER — Other Ambulatory Visit: Payer: Self-pay | Admitting: *Deleted

## 2020-04-29 DIAGNOSIS — C50211 Malignant neoplasm of upper-inner quadrant of right female breast: Secondary | ICD-10-CM

## 2020-04-29 DIAGNOSIS — Z170 Estrogen receptor positive status [ER+]: Secondary | ICD-10-CM

## 2020-04-29 MED ORDER — AMLODIPINE BESYLATE 10 MG PO TABS: 10.0000 mg | ORAL_TABLET | Freq: Every day | ORAL | 5 refills | Status: DC

## 2020-04-30 DIAGNOSIS — N186 End stage renal disease: Secondary | ICD-10-CM | POA: Diagnosis not present

## 2020-04-30 DIAGNOSIS — D509 Iron deficiency anemia, unspecified: Secondary | ICD-10-CM | POA: Diagnosis not present

## 2020-04-30 DIAGNOSIS — D631 Anemia in chronic kidney disease: Secondary | ICD-10-CM | POA: Diagnosis not present

## 2020-04-30 DIAGNOSIS — E1129 Type 2 diabetes mellitus with other diabetic kidney complication: Secondary | ICD-10-CM | POA: Diagnosis not present

## 2020-04-30 DIAGNOSIS — Z992 Dependence on renal dialysis: Secondary | ICD-10-CM | POA: Diagnosis not present

## 2020-04-30 DIAGNOSIS — N2581 Secondary hyperparathyroidism of renal origin: Secondary | ICD-10-CM | POA: Diagnosis not present

## 2020-05-03 DIAGNOSIS — E1129 Type 2 diabetes mellitus with other diabetic kidney complication: Secondary | ICD-10-CM | POA: Diagnosis not present

## 2020-05-03 DIAGNOSIS — N186 End stage renal disease: Secondary | ICD-10-CM | POA: Diagnosis not present

## 2020-05-03 DIAGNOSIS — D509 Iron deficiency anemia, unspecified: Secondary | ICD-10-CM | POA: Diagnosis not present

## 2020-05-03 DIAGNOSIS — D631 Anemia in chronic kidney disease: Secondary | ICD-10-CM | POA: Diagnosis not present

## 2020-05-03 DIAGNOSIS — N2581 Secondary hyperparathyroidism of renal origin: Secondary | ICD-10-CM | POA: Diagnosis not present

## 2020-05-03 DIAGNOSIS — Z992 Dependence on renal dialysis: Secondary | ICD-10-CM | POA: Diagnosis not present

## 2020-05-06 DIAGNOSIS — D509 Iron deficiency anemia, unspecified: Secondary | ICD-10-CM | POA: Diagnosis not present

## 2020-05-06 DIAGNOSIS — N2581 Secondary hyperparathyroidism of renal origin: Secondary | ICD-10-CM | POA: Diagnosis not present

## 2020-05-06 DIAGNOSIS — E1129 Type 2 diabetes mellitus with other diabetic kidney complication: Secondary | ICD-10-CM | POA: Diagnosis not present

## 2020-05-06 DIAGNOSIS — Z992 Dependence on renal dialysis: Secondary | ICD-10-CM | POA: Diagnosis not present

## 2020-05-06 DIAGNOSIS — N186 End stage renal disease: Secondary | ICD-10-CM | POA: Diagnosis not present

## 2020-05-06 DIAGNOSIS — D631 Anemia in chronic kidney disease: Secondary | ICD-10-CM | POA: Diagnosis not present

## 2020-05-07 DIAGNOSIS — N2581 Secondary hyperparathyroidism of renal origin: Secondary | ICD-10-CM | POA: Diagnosis not present

## 2020-05-07 DIAGNOSIS — Z992 Dependence on renal dialysis: Secondary | ICD-10-CM | POA: Diagnosis not present

## 2020-05-07 DIAGNOSIS — N186 End stage renal disease: Secondary | ICD-10-CM | POA: Diagnosis not present

## 2020-05-07 DIAGNOSIS — D631 Anemia in chronic kidney disease: Secondary | ICD-10-CM | POA: Diagnosis not present

## 2020-05-07 DIAGNOSIS — E1129 Type 2 diabetes mellitus with other diabetic kidney complication: Secondary | ICD-10-CM | POA: Diagnosis not present

## 2020-05-07 DIAGNOSIS — D509 Iron deficiency anemia, unspecified: Secondary | ICD-10-CM | POA: Diagnosis not present

## 2020-05-10 DIAGNOSIS — N186 End stage renal disease: Secondary | ICD-10-CM | POA: Diagnosis not present

## 2020-05-10 DIAGNOSIS — Z992 Dependence on renal dialysis: Secondary | ICD-10-CM | POA: Diagnosis not present

## 2020-05-10 DIAGNOSIS — D631 Anemia in chronic kidney disease: Secondary | ICD-10-CM | POA: Diagnosis not present

## 2020-05-10 DIAGNOSIS — N2581 Secondary hyperparathyroidism of renal origin: Secondary | ICD-10-CM | POA: Diagnosis not present

## 2020-05-10 DIAGNOSIS — E1129 Type 2 diabetes mellitus with other diabetic kidney complication: Secondary | ICD-10-CM | POA: Diagnosis not present

## 2020-05-10 DIAGNOSIS — D509 Iron deficiency anemia, unspecified: Secondary | ICD-10-CM | POA: Diagnosis not present

## 2020-05-12 DIAGNOSIS — E1129 Type 2 diabetes mellitus with other diabetic kidney complication: Secondary | ICD-10-CM | POA: Diagnosis not present

## 2020-05-12 DIAGNOSIS — N186 End stage renal disease: Secondary | ICD-10-CM | POA: Diagnosis not present

## 2020-05-12 DIAGNOSIS — N2581 Secondary hyperparathyroidism of renal origin: Secondary | ICD-10-CM | POA: Diagnosis not present

## 2020-05-12 DIAGNOSIS — D631 Anemia in chronic kidney disease: Secondary | ICD-10-CM | POA: Diagnosis not present

## 2020-05-12 DIAGNOSIS — D509 Iron deficiency anemia, unspecified: Secondary | ICD-10-CM | POA: Diagnosis not present

## 2020-05-12 DIAGNOSIS — Z992 Dependence on renal dialysis: Secondary | ICD-10-CM | POA: Diagnosis not present

## 2020-05-13 ENCOUNTER — Telehealth: Payer: Self-pay | Admitting: Family Medicine

## 2020-05-13 MED ORDER — NEBIVOLOL HCL 10 MG PO TABS
10.0000 mg | ORAL_TABLET | Freq: Two times a day (BID) | ORAL | 3 refills | Status: DC
Start: 2020-05-13 — End: 2021-04-21

## 2020-05-13 NOTE — Telephone Encounter (Signed)
Patient left vm asking stating she had a question about her bystolic and that the pharmacy won't fill it again until 06/12/2020.  CB# [phone]

## 2020-05-13 NOTE — Telephone Encounter (Signed)
Patient was taking Bystolic BID, while prescription was written for QD.   Pharmacy will not fill at this time as it appears to be too soon.   New prescription sent with corrected dosage.   Call placed to patient and patient made aware.

## 2020-05-14 ENCOUNTER — Ambulatory Visit: Payer: Medicare Other | Admitting: Gastroenterology

## 2020-05-14 DIAGNOSIS — E119 Type 2 diabetes mellitus without complications: Secondary | ICD-10-CM | POA: Diagnosis not present

## 2020-05-14 DIAGNOSIS — N186 End stage renal disease: Secondary | ICD-10-CM | POA: Diagnosis not present

## 2020-05-14 DIAGNOSIS — D509 Iron deficiency anemia, unspecified: Secondary | ICD-10-CM | POA: Diagnosis not present

## 2020-05-14 DIAGNOSIS — N040 Nephrotic syndrome with minor glomerular abnormality: Secondary | ICD-10-CM | POA: Diagnosis not present

## 2020-05-14 DIAGNOSIS — N2581 Secondary hyperparathyroidism of renal origin: Secondary | ICD-10-CM | POA: Diagnosis not present

## 2020-05-14 DIAGNOSIS — Z992 Dependence on renal dialysis: Secondary | ICD-10-CM | POA: Diagnosis not present

## 2020-05-14 DIAGNOSIS — E1129 Type 2 diabetes mellitus with other diabetic kidney complication: Secondary | ICD-10-CM | POA: Diagnosis not present

## 2020-05-14 DIAGNOSIS — D631 Anemia in chronic kidney disease: Secondary | ICD-10-CM | POA: Diagnosis not present

## 2020-05-17 DIAGNOSIS — N2581 Secondary hyperparathyroidism of renal origin: Secondary | ICD-10-CM | POA: Diagnosis not present

## 2020-05-17 DIAGNOSIS — D631 Anemia in chronic kidney disease: Secondary | ICD-10-CM | POA: Diagnosis not present

## 2020-05-17 DIAGNOSIS — E1129 Type 2 diabetes mellitus with other diabetic kidney complication: Secondary | ICD-10-CM | POA: Diagnosis not present

## 2020-05-17 DIAGNOSIS — N186 End stage renal disease: Secondary | ICD-10-CM | POA: Diagnosis not present

## 2020-05-17 DIAGNOSIS — Z992 Dependence on renal dialysis: Secondary | ICD-10-CM | POA: Diagnosis not present

## 2020-05-17 DIAGNOSIS — D509 Iron deficiency anemia, unspecified: Secondary | ICD-10-CM | POA: Diagnosis not present

## 2020-05-18 ENCOUNTER — Ambulatory Visit: Payer: Medicare Other | Admitting: Gastroenterology

## 2020-05-20 DIAGNOSIS — D631 Anemia in chronic kidney disease: Secondary | ICD-10-CM | POA: Diagnosis not present

## 2020-05-20 DIAGNOSIS — N2581 Secondary hyperparathyroidism of renal origin: Secondary | ICD-10-CM | POA: Diagnosis not present

## 2020-05-20 DIAGNOSIS — E1129 Type 2 diabetes mellitus with other diabetic kidney complication: Secondary | ICD-10-CM | POA: Diagnosis not present

## 2020-05-20 DIAGNOSIS — D509 Iron deficiency anemia, unspecified: Secondary | ICD-10-CM | POA: Diagnosis not present

## 2020-05-20 DIAGNOSIS — Z992 Dependence on renal dialysis: Secondary | ICD-10-CM | POA: Diagnosis not present

## 2020-05-20 DIAGNOSIS — N186 End stage renal disease: Secondary | ICD-10-CM | POA: Diagnosis not present

## 2020-05-21 DIAGNOSIS — N186 End stage renal disease: Secondary | ICD-10-CM | POA: Diagnosis not present

## 2020-05-21 DIAGNOSIS — E1129 Type 2 diabetes mellitus with other diabetic kidney complication: Secondary | ICD-10-CM | POA: Diagnosis not present

## 2020-05-21 DIAGNOSIS — N2581 Secondary hyperparathyroidism of renal origin: Secondary | ICD-10-CM | POA: Diagnosis not present

## 2020-05-21 DIAGNOSIS — D509 Iron deficiency anemia, unspecified: Secondary | ICD-10-CM | POA: Diagnosis not present

## 2020-05-21 DIAGNOSIS — D631 Anemia in chronic kidney disease: Secondary | ICD-10-CM | POA: Diagnosis not present

## 2020-05-21 DIAGNOSIS — Z992 Dependence on renal dialysis: Secondary | ICD-10-CM | POA: Diagnosis not present

## 2020-05-24 DIAGNOSIS — E1129 Type 2 diabetes mellitus with other diabetic kidney complication: Secondary | ICD-10-CM | POA: Diagnosis not present

## 2020-05-24 DIAGNOSIS — Z992 Dependence on renal dialysis: Secondary | ICD-10-CM | POA: Diagnosis not present

## 2020-05-24 DIAGNOSIS — D509 Iron deficiency anemia, unspecified: Secondary | ICD-10-CM | POA: Diagnosis not present

## 2020-05-24 DIAGNOSIS — N186 End stage renal disease: Secondary | ICD-10-CM | POA: Diagnosis not present

## 2020-05-24 DIAGNOSIS — D631 Anemia in chronic kidney disease: Secondary | ICD-10-CM | POA: Diagnosis not present

## 2020-05-24 DIAGNOSIS — N2581 Secondary hyperparathyroidism of renal origin: Secondary | ICD-10-CM | POA: Diagnosis not present

## 2020-05-26 DIAGNOSIS — D631 Anemia in chronic kidney disease: Secondary | ICD-10-CM | POA: Diagnosis not present

## 2020-05-26 DIAGNOSIS — N2581 Secondary hyperparathyroidism of renal origin: Secondary | ICD-10-CM | POA: Diagnosis not present

## 2020-05-26 DIAGNOSIS — D509 Iron deficiency anemia, unspecified: Secondary | ICD-10-CM | POA: Diagnosis not present

## 2020-05-26 DIAGNOSIS — Z992 Dependence on renal dialysis: Secondary | ICD-10-CM | POA: Diagnosis not present

## 2020-05-26 DIAGNOSIS — N186 End stage renal disease: Secondary | ICD-10-CM | POA: Diagnosis not present

## 2020-05-26 DIAGNOSIS — E1129 Type 2 diabetes mellitus with other diabetic kidney complication: Secondary | ICD-10-CM | POA: Diagnosis not present

## 2020-05-27 ENCOUNTER — Encounter: Payer: Self-pay | Admitting: General Surgery

## 2020-05-27 ENCOUNTER — Ambulatory Visit (INDEPENDENT_AMBULATORY_CARE_PROVIDER_SITE_OTHER): Payer: Medicare Other | Admitting: General Surgery

## 2020-05-27 ENCOUNTER — Other Ambulatory Visit: Payer: Self-pay

## 2020-05-27 VITALS — BP 151/81 | HR 82 | Temp 98.8°F | Resp 16 | Ht 62.0 in | Wt 174.0 lb

## 2020-05-27 DIAGNOSIS — Z09 Encounter for follow-up examination after completed treatment for conditions other than malignant neoplasm: Secondary | ICD-10-CM

## 2020-05-27 NOTE — Patient Instructions (Signed)
Prophylactic Mastectomy Information Prophylactic mastectomy is surgery to remove breast tissue in order to prevent breast cancer. This is also called breast cancer risk-reducing surgery. Usually, both breasts are removed in this procedure, but it may also be done to remove the remaining breast if you have previously had one breast removed because of breast cancer. This surgery may reduce your risk of getting breast cancer by 90 to 95 percent. Who should consider having this surgery? You may want to consider this surgery if:  You have a strong family history of breast cancer. This means that you have two family members with a history of breast cancer and at least one is your mother, sister, or daughter.  You have been tested and found out that you have a gene that increases your risk for breast cancer. These genes include: ? BRCA1 or BRCA2. ? CDH1. ? TP53. ? PALB2. ? PTEN. ? Other genes are being studied that may increase a person's risk for breast cancer.  You have had cancer in one breast and want to take precautions to prevent cancer in your remaining breast.  You have been diagnosed with a condition called lobular carcinoma in situ (LCIS) in addition to having another risk factor. This condition has a high risk of becoming an aggressive (invasive) cancer in the future.  You had radiation treatments to your chest before age 39. This may increase your risk for breast cancer.  You have tiny calcium growths in your breasts (microcalcifications). These calcifications may indicate cancer. If they are spread throughout your breasts, you may need multiple biopsies. This can lead to scar tissue. The combination of scar tissue and calcifications can make it very difficult to diagnose breast cancer. Having a prophylactic mastectomy is a big decision. Discuss your risk for breast cancer with your health care provider before deciding to have this surgery. Be sure to discuss your options for surgery and  consider whether you want to have reconstructive breast surgery. Reconstruction may be done at the time of the mastectomy surgery or at a later date. What are the types of prophylactic mastectomy? The main types of prophylactic mastectomy include:  Simple or total mastectomy. All breast tissue, along with the skin and nipple, are removed. The lymph nodes and the muscles of your chest are left in place.  Skin-sparing mastectomy. The breast tissue and the nipple are removed, but the breast skin is left in place for reconstruction.  Nipple-sparing mastectomy. The breast tissue is removed without removing any of the breast skin, areola, or nipple. This procedure allows the breast to be reconstructed in a way that looks more like your natural breast. What are the risks? When considering the benefits of breast cancer prevention surgery, you also need to discuss the risks of surgery with your health care provider. The risks include:  Risks of the surgical procedure, such as infection or bleeding.  The appearance of scar tissue and the loss of breast sensation after reconstruction.  The psychological impact of surgery, including how the surgery may affect your self-image and sexuality.  The risk of getting breast cancer, even after surgery. What should I do before deciding to have this surgery?   Take the time to do research before making the decision to have this procedure. Make sure you understand your risk for breast cancer, your options for surgery or reconstruction, and possible risks of the surgery.  Consider getting a second opinion before deciding on surgery. Make sure you understand all of your options.  Talk with others who have had a prophylactic mastectomy.  Ask your health care provider about other options for reducing your risk for breast cancer. These may include: ? Taking medicines that block the female hormone that may stimulate breast cancers (estrogen). ? Having breast cancer  screenings more often and including an MRI along with your mammograms. Where to find more information  Breastcancer.org: www.breastcancer.[place]: ww5.komen.[place]: www.cancer.gov Summary  Prophylactic mastectomy is surgery to remove breast tissue in order to prevent breast cancer.  You may want to consider this surgery if you are at high risk for breast cancer.  There are several options for surgery and breast reconstruction.  Discuss the risks of surgery with your health care provider and research all your options before deciding to have this procedure. Consider both the short-term and long-term risks of surgery. This information is not intended to replace advice given to you by your health care provider. Make sure you discuss any questions you have with your health care provider. Document Revised: 10/04/2018 Document Reviewed: 04/04/2018 Elsevier Patient Education  2020 [employer].

## 2020-05-27 NOTE — Progress Notes (Signed)
Presents to schedule prophylactic left mastectomy.  Patient dialyzes on Monday Wednesdays and Fridays.  We will try to perform the surgery on Tuesday.  Patient does realize that it may be a few weeks due to the hospital just now returning to a regular schedule.

## 2020-05-28 DIAGNOSIS — N186 End stage renal disease: Secondary | ICD-10-CM | POA: Diagnosis not present

## 2020-05-28 DIAGNOSIS — D631 Anemia in chronic kidney disease: Secondary | ICD-10-CM | POA: Diagnosis not present

## 2020-05-28 DIAGNOSIS — Z992 Dependence on renal dialysis: Secondary | ICD-10-CM | POA: Diagnosis not present

## 2020-05-28 DIAGNOSIS — E1129 Type 2 diabetes mellitus with other diabetic kidney complication: Secondary | ICD-10-CM | POA: Diagnosis not present

## 2020-05-28 DIAGNOSIS — D509 Iron deficiency anemia, unspecified: Secondary | ICD-10-CM | POA: Diagnosis not present

## 2020-05-28 DIAGNOSIS — N2581 Secondary hyperparathyroidism of renal origin: Secondary | ICD-10-CM | POA: Diagnosis not present

## 2020-05-31 DIAGNOSIS — E1129 Type 2 diabetes mellitus with other diabetic kidney complication: Secondary | ICD-10-CM | POA: Diagnosis not present

## 2020-05-31 DIAGNOSIS — N2581 Secondary hyperparathyroidism of renal origin: Secondary | ICD-10-CM | POA: Diagnosis not present

## 2020-05-31 DIAGNOSIS — Z992 Dependence on renal dialysis: Secondary | ICD-10-CM | POA: Diagnosis not present

## 2020-05-31 DIAGNOSIS — D509 Iron deficiency anemia, unspecified: Secondary | ICD-10-CM | POA: Diagnosis not present

## 2020-05-31 DIAGNOSIS — D631 Anemia in chronic kidney disease: Secondary | ICD-10-CM | POA: Diagnosis not present

## 2020-05-31 DIAGNOSIS — N186 End stage renal disease: Secondary | ICD-10-CM | POA: Diagnosis not present

## 2020-06-01 ENCOUNTER — Ambulatory Visit: Payer: Medicare Other | Admitting: General Surgery

## 2020-06-03 DIAGNOSIS — D631 Anemia in chronic kidney disease: Secondary | ICD-10-CM | POA: Diagnosis not present

## 2020-06-03 DIAGNOSIS — N186 End stage renal disease: Secondary | ICD-10-CM | POA: Diagnosis not present

## 2020-06-03 DIAGNOSIS — E1129 Type 2 diabetes mellitus with other diabetic kidney complication: Secondary | ICD-10-CM | POA: Diagnosis not present

## 2020-06-03 DIAGNOSIS — N2581 Secondary hyperparathyroidism of renal origin: Secondary | ICD-10-CM | POA: Diagnosis not present

## 2020-06-03 DIAGNOSIS — Z992 Dependence on renal dialysis: Secondary | ICD-10-CM | POA: Diagnosis not present

## 2020-06-03 DIAGNOSIS — D509 Iron deficiency anemia, unspecified: Secondary | ICD-10-CM | POA: Diagnosis not present

## 2020-06-03 DIAGNOSIS — E119 Type 2 diabetes mellitus without complications: Secondary | ICD-10-CM | POA: Diagnosis not present

## 2020-06-03 DIAGNOSIS — E039 Hypothyroidism, unspecified: Secondary | ICD-10-CM | POA: Diagnosis not present

## 2020-06-04 DIAGNOSIS — N2581 Secondary hyperparathyroidism of renal origin: Secondary | ICD-10-CM | POA: Diagnosis not present

## 2020-06-04 DIAGNOSIS — Z992 Dependence on renal dialysis: Secondary | ICD-10-CM | POA: Diagnosis not present

## 2020-06-04 DIAGNOSIS — D509 Iron deficiency anemia, unspecified: Secondary | ICD-10-CM | POA: Diagnosis not present

## 2020-06-04 DIAGNOSIS — N186 End stage renal disease: Secondary | ICD-10-CM | POA: Diagnosis not present

## 2020-06-04 DIAGNOSIS — D631 Anemia in chronic kidney disease: Secondary | ICD-10-CM | POA: Diagnosis not present

## 2020-06-04 DIAGNOSIS — E1129 Type 2 diabetes mellitus with other diabetic kidney complication: Secondary | ICD-10-CM | POA: Diagnosis not present

## 2020-06-07 DIAGNOSIS — Z992 Dependence on renal dialysis: Secondary | ICD-10-CM | POA: Diagnosis not present

## 2020-06-07 DIAGNOSIS — D631 Anemia in chronic kidney disease: Secondary | ICD-10-CM | POA: Diagnosis not present

## 2020-06-07 DIAGNOSIS — D509 Iron deficiency anemia, unspecified: Secondary | ICD-10-CM | POA: Diagnosis not present

## 2020-06-07 DIAGNOSIS — N2581 Secondary hyperparathyroidism of renal origin: Secondary | ICD-10-CM | POA: Diagnosis not present

## 2020-06-07 DIAGNOSIS — E1129 Type 2 diabetes mellitus with other diabetic kidney complication: Secondary | ICD-10-CM | POA: Diagnosis not present

## 2020-06-07 DIAGNOSIS — N186 End stage renal disease: Secondary | ICD-10-CM | POA: Diagnosis not present

## 2020-06-08 NOTE — Patient Instructions (Signed)
KALYSSA ANKER  06/08/2020     @PREFPERIOPPHARMACY @   Your procedure is scheduled on  06/15/2020.  Report to [name] at  [phone]  A.M.  Call this number if you have problems the morning of surgery:  [phone]   Remember:  Do not eat or drink after midnight.                          Take these medicines the morning of surgery with A SIP OF WATER armidex, cymbalta, gabapentin, bystolic, protonix.    Do not wear jewelry, make-up or nail polish.  Do not wear lotions, powders, or perfumes, or deodorant. Please brush your teeth.  Do not shave 48 hours prior to surgery.  Men may shave face and neck.  Do not bring valuables to the hospital.  [facility] is not responsible for any belongings or valuables.  Contacts, dentures or bridgework may not be worn into surgery.  Leave your suitcase in the car.  After surgery it may be brought to your room.  For patients admitted to the hospital, discharge time will be determined by your treatment team.  Patients discharged the day of surgery will not be allowed to drive home.   Name and phone number of your driver:   family Special instructions:  DO NOT smoke the morning of your procedure.  Please read over the following fact sheets that you were given. Anesthesia Post-op Instructions and Care and Recovery After Surgery       Breast Biopsy, Care After These instructions give you information about caring for yourself after your procedure. Your doctor may also give you more specific instructions. Call your doctor if you have any problems or questions after your procedure. What can I expect after the procedure? After your procedure, it is common to have:  Bruising on your breast.  Numbness, tingling, or pain near your biopsy site. Follow these instructions at home: Medicines  Take over-the-counter and prescription medicines only as told by your doctor.  Do not drive for 24 hours if you were given a medicine to help  you relax (sedative) during your procedure.  Do not drink alcohol while taking pain medicine.  Do not drive or use heavy machinery while taking prescription pain medicine. Biopsy site care      Follow instructions from your doctor about how to take care of your cut from surgery (incision) or your puncture area. Make sure you: ? Wash your hands with soap and water before you change your bandage (dressing). If you cannot use soap and water, use hand sanitizer. ? Change your bandage as told by your doctor. ? Leave stitches (sutures), skin glue, or skin tape (adhesive strips) in place. They may need to stay in place for 2 weeks or longer. If tape strips get loose and curl up, you may trim the loose edges. Do not remove tape strips completely unless your doctor says it is okay.  If you have stitches, keep them dry when you take a bath or a shower.  Check your cut or puncture area every day for signs of infection. Check for: ? Redness, swelling, or pain. ? Fluid or blood. ? Warmth. ? Pus or a bad smell.  Protect the biopsy area. Do not let the area get bumped. Activity  If you had a cut during your procedure, avoid activities that could pull your cut open. These include: ? Stretching. ?  Reaching over your head. ? Exercise. ? Sports. ? Lifting anything that weighs more than 3 lb (1.4 kg).  Return to your normal activities as told by your doctor. Ask your doctor what activities are safe for you. Managing pain, stiffness, and swelling If told, put ice on the biopsy site to relieve swelling:  Put ice in a plastic bag.  Place a towel between your skin and the bag.  Leave the ice on for 20 minutes, 2-3 times a day. General instructions  Continue your normal diet.  Wear a good support bra for as long as told by your doctor.  Get checked for extra fluid around your lymph nodes (lymphedema) as often as told by your doctor.  Keep all follow-up visits as told by your doctor. This is  important. Contact a doctor if:  You notice any of the following at the biopsy site: ? More redness, swelling, or pain. ? More fluid or blood coming from the site. ? The site feels warm to the touch. ? Pus or a bad smell coming from the site. ? The site breaks open after the stitches or skin tape strips have been removed.  You have a rash.  You have a fever. Get help right away if:  You have more bleeding from the biopsy site. Get help right away if bleeding is more than a small spot.  You have trouble breathing.  You have red streaks around the biopsy site. Summary  After your procedure, it is common to have bruising, numbness, tingling, or pain near the biopsy site.  Do not drive or use heavy machinery while taking prescription pain medicine.  Wear a good support bra for as long as told by your doctor.  If you had a cut during your procedure, avoid activities that may pull the cut open. Ask your doctor what activities are safe for you. This information is not intended to replace advice given to you by your health care provider. Make sure you discuss any questions you have with your health care provider. Document Revised: 01/17/2018 Document Reviewed: 01/17/2018 Elsevier Patient Education  [place] After These instructions provide you with information about caring for yourself after your procedure. Your health care provider may also give you more specific instructions. Your treatment has been planned according to current medical practices, but problems sometimes occur. Call your health care provider if you have any problems or questions after your procedure. What can I expect after the procedure? After your procedure, you may:  Feel sleepy for several hours.  Feel clumsy and have poor balance for several hours.  Feel forgetful about what happened after the procedure.  Have poor judgment for several hours.  Feel nauseous or  vomit.  Have a sore throat if you had a breathing tube during the procedure. Follow these instructions at home: For at least 24 hours after the procedure:      Have a responsible adult stay with you. It is important to have someone help care for you until you are awake and alert.  Rest as needed.  Do not: ? Participate in activities in which you could fall or become injured. ? Drive. ? Use heavy machinery. ? Drink alcohol. ? Take sleeping pills or medicines that cause drowsiness. ? Make important decisions or sign legal documents. ? Take care of children on your own. Eating and drinking  Follow the diet that is recommended by your health care provider.  If you vomit,  drink water, juice, or soup when you can drink without vomiting.  Make sure you have little or no nausea before eating solid foods. General instructions  Take over-the-counter and prescription medicines only as told by your health care provider.  If you have sleep apnea, surgery and certain medicines can increase your risk for breathing problems. Follow instructions from your health care provider about wearing your sleep device: ? Anytime you are sleeping, including during daytime naps. ? While taking prescription pain medicines, sleeping medicines, or medicines that make you drowsy.  If you smoke, do not smoke without supervision.  Keep all follow-up visits as told by your health care provider. This is important. Contact a health care provider if:  You keep feeling nauseous or you keep vomiting.  You feel light-headed.  You develop a rash.  You have a fever. Get help right away if:  You have trouble breathing. Summary  For several hours after your procedure, you may feel sleepy and have poor judgment.  Have a responsible adult stay with you for at least 24 hours or until you are awake and alert. This information is not intended to replace advice given to you by your health care provider. Make sure  you discuss any questions you have with your health care provider. Document Revised: 10/29/2017 Document Reviewed: 11/21/2015 Elsevier Patient Education  [place]. How to Use Chlorhexidine for Bathing Chlorhexidine gluconate (CHG) is a germ-killing (antiseptic) solution that is used to clean the skin. It can get rid of the bacteria that normally live on the skin and can keep them away for about 24 hours. To clean your skin with CHG, you may be given:  A CHG solution to use in the shower or as part of a sponge bath.  A prepackaged cloth that contains CHG. Cleaning your skin with CHG may help lower the risk for infection:  While you are staying in the intensive care unit of the hospital.  If you have a vascular access, such as a central line, to provide short-term or long-term access to your veins.  If you have a catheter to drain urine from your bladder.  If you are on a ventilator. A ventilator is a machine that helps you breathe by moving air in and out of your lungs.  After surgery. What are the risks? Risks of using CHG include:  A skin reaction.  Hearing loss, if CHG gets in your ears.  Eye injury, if CHG gets in your eyes and is not rinsed out.  The CHG product catching fire. Make sure that you avoid smoking and flames after applying CHG to your skin. Do not use CHG:  If you have a chlorhexidine allergy or have previously reacted to chlorhexidine.  On babies younger than 90 months of age. How to use CHG solution  Use CHG only as told by your health care provider, and follow the instructions on the label.  Use the full amount of CHG as directed. Usually, this is one bottle. During a shower Follow these steps when using CHG solution during a shower (unless your health care provider gives you different instructions): 1. Start the shower. 2. Use your normal soap and shampoo to wash your face and hair. 3. Turn off the shower or move out of the shower  stream. 4. Pour the CHG onto a clean washcloth. Do not use any type of brush or rough-edged sponge. 5. Starting at your neck, lather your body down to your toes. Make sure you  follow these instructions: ? If you will be having surgery, pay special attention to the part of your body where you will be having surgery. Scrub this area for at least 1 minute. ? Do not use CHG on your head or face. If the solution gets into your ears or eyes, rinse them well with water. ? Avoid your genital area. ? Avoid any areas of skin that have broken skin, cuts, or scrapes. ? Scrub your back and under your arms. Make sure to wash skin folds. 6. Let the lather sit on your skin for 1-2 minutes or as long as told by your health care provider. 7. Thoroughly rinse your entire body in the shower. Make sure that all body creases and crevices are rinsed well. 8. Dry off with a clean towel. Do not put any substances on your body afterward--such as powder, lotion, or perfume--unless you are told to do so by your health care provider. Only use lotions that are recommended by the manufacturer. 9. Put on clean clothes or pajamas. 10. If it is the night before your surgery, sleep in clean sheets.  During a sponge bath Follow these steps when using CHG solution during a sponge bath (unless your health care provider gives you different instructions): 1. Use your normal soap and shampoo to wash your face and hair. 2. Pour the CHG onto a clean washcloth. 3. Starting at your neck, lather your body down to your toes. Make sure you follow these instructions: ? If you will be having surgery, pay special attention to the part of your body where you will be having surgery. Scrub this area for at least 1 minute. ? Do not use CHG on your head or face. If the solution gets into your ears or eyes, rinse them well with water. ? Avoid your genital area. ? Avoid any areas of skin that have broken skin, cuts, or scrapes. ? Scrub your back and  under your arms. Make sure to wash skin folds. 4. Let the lather sit on your skin for 1-2 minutes or as long as told by your health care provider. 5. Using a different clean, wet washcloth, thoroughly rinse your entire body. Make sure that all body creases and crevices are rinsed well. 6. Dry off with a clean towel. Do not put any substances on your body afterward--such as powder, lotion, or perfume--unless you are told to do so by your health care provider. Only use lotions that are recommended by the manufacturer. 7. Put on clean clothes or pajamas. 8. If it is the night before your surgery, sleep in clean sheets. How to use CHG prepackaged cloths  Only use CHG cloths as told by your health care provider, and follow the instructions on the label.  Use the CHG cloth on clean, dry skin.  Do not use the CHG cloth on your head or face unless your health care provider tells you to.  When washing with the CHG cloth: ? Avoid your genital area. ? Avoid any areas of skin that have broken skin, cuts, or scrapes. Before surgery Follow these steps when using a CHG cloth to clean before surgery (unless your health care provider gives you different instructions): 1. Using the CHG cloth, vigorously scrub the part of your body where you will be having surgery. Scrub using a back-and-forth motion for 3 minutes. The area on your body should be completely wet with CHG when you are done scrubbing. 2. Do not rinse. Discard the cloth and let  the area air-dry. Do not put any substances on the area afterward, such as powder, lotion, or perfume. 3. Put on clean clothes or pajamas. 4. If it is the night before your surgery, sleep in clean sheets.  For general bathing Follow these steps when using CHG cloths for general bathing (unless your health care provider gives you different instructions). 1. Use a separate CHG cloth for each area of your body. Make sure you wash between any folds of skin and between your  fingers and toes. Wash your body in the following order, switching to a new cloth after each step: ? The front of your neck, shoulders, and chest. ? Both of your arms, under your arms, and your hands. ? Your stomach and groin area, avoiding the genitals. ? Your right leg and foot. ? Your left leg and foot. ? The back of your neck, your back, and your buttocks. 2. Do not rinse. Discard the cloth and let the area air-dry. Do not put any substances on your body afterward--such as powder, lotion, or perfume--unless you are told to do so by your health care provider. Only use lotions that are recommended by the manufacturer. 3. Put on clean clothes or pajamas. Contact a health care provider if:  Your skin gets irritated after scrubbing.  You have questions about using your solution or cloth. Get help right away if:  Your eyes become very red or swollen.  Your eyes itch badly.  Your skin itches badly and is red or swollen.  Your hearing changes.  You have trouble seeing.  You have swelling or tingling in your mouth or throat.  You have trouble breathing.  You swallow any chlorhexidine. Summary  Chlorhexidine gluconate (CHG) is a germ-killing (antiseptic) solution that is used to clean the skin. Cleaning your skin with CHG may help to lower your risk for infection.  You may be given CHG to use for bathing. It may be in a bottle or in a prepackaged cloth to use on your skin. Carefully follow your health care provider's instructions and the instructions on the product label.  Do not use CHG if you have a chlorhexidine allergy.  Contact your health care provider if your skin gets irritated after scrubbing. This information is not intended to replace advice given to you by your health care provider. Make sure you discuss any questions you have with your health care provider. Document Revised: 10/17/2018 Document Reviewed: 06/28/2017 Elsevier Patient Education  [place].

## 2020-06-09 DIAGNOSIS — D509 Iron deficiency anemia, unspecified: Secondary | ICD-10-CM | POA: Diagnosis not present

## 2020-06-09 DIAGNOSIS — N2581 Secondary hyperparathyroidism of renal origin: Secondary | ICD-10-CM | POA: Diagnosis not present

## 2020-06-09 DIAGNOSIS — Z992 Dependence on renal dialysis: Secondary | ICD-10-CM | POA: Diagnosis not present

## 2020-06-09 DIAGNOSIS — N186 End stage renal disease: Secondary | ICD-10-CM | POA: Diagnosis not present

## 2020-06-09 DIAGNOSIS — D631 Anemia in chronic kidney disease: Secondary | ICD-10-CM | POA: Diagnosis not present

## 2020-06-09 DIAGNOSIS — E1129 Type 2 diabetes mellitus with other diabetic kidney complication: Secondary | ICD-10-CM | POA: Diagnosis not present

## 2020-06-10 ENCOUNTER — Encounter (HOSPITAL_COMMUNITY): Payer: Self-pay

## 2020-06-10 ENCOUNTER — Other Ambulatory Visit: Payer: Self-pay

## 2020-06-10 ENCOUNTER — Encounter (HOSPITAL_COMMUNITY)
Admission: RE | Admit: 2020-06-10 | Discharge: 2020-06-10 | Disposition: A | Discharge status: Home / Self Care | Payer: Medicare Other | Source: Ambulatory Visit | Attending: General Surgery | Admitting: General Surgery

## 2020-06-11 ENCOUNTER — Other Ambulatory Visit (HOSPITAL_COMMUNITY)
Admission: RE | Admit: 2020-06-11 | Discharge: 2020-06-11 | Disposition: A | Discharge status: Home / Self Care | Payer: Medicare Other | Source: Ambulatory Visit | Attending: General Surgery | Admitting: General Surgery

## 2020-06-11 ENCOUNTER — Other Ambulatory Visit: Payer: Self-pay

## 2020-06-11 DIAGNOSIS — Z20822 Contact with and (suspected) exposure to covid-19: Secondary | ICD-10-CM | POA: Diagnosis not present

## 2020-06-11 DIAGNOSIS — N2581 Secondary hyperparathyroidism of renal origin: Secondary | ICD-10-CM | POA: Diagnosis not present

## 2020-06-11 DIAGNOSIS — Z01812 Encounter for preprocedural laboratory examination: Secondary | ICD-10-CM | POA: Insufficient documentation

## 2020-06-11 DIAGNOSIS — Z992 Dependence on renal dialysis: Secondary | ICD-10-CM | POA: Diagnosis not present

## 2020-06-11 DIAGNOSIS — E1129 Type 2 diabetes mellitus with other diabetic kidney complication: Secondary | ICD-10-CM | POA: Diagnosis not present

## 2020-06-11 DIAGNOSIS — D509 Iron deficiency anemia, unspecified: Secondary | ICD-10-CM | POA: Diagnosis not present

## 2020-06-11 DIAGNOSIS — D631 Anemia in chronic kidney disease: Secondary | ICD-10-CM | POA: Diagnosis not present

## 2020-06-11 DIAGNOSIS — N186 End stage renal disease: Secondary | ICD-10-CM | POA: Diagnosis not present

## 2020-06-11 LAB — SARS CORONAVIRUS 2 (TAT 6-24 HRS): SARS Coronavirus 2: NEGATIVE

## 2020-06-12 ENCOUNTER — Other Ambulatory Visit: Payer: Self-pay | Admitting: Family Medicine

## 2020-06-14 DIAGNOSIS — C50211 Malignant neoplasm of upper-inner quadrant of right female breast: Secondary | ICD-10-CM | POA: Diagnosis not present

## 2020-06-14 DIAGNOSIS — I509 Heart failure, unspecified: Secondary | ICD-10-CM | POA: Diagnosis not present

## 2020-06-14 DIAGNOSIS — N040 Nephrotic syndrome with minor glomerular abnormality: Secondary | ICD-10-CM | POA: Diagnosis not present

## 2020-06-14 DIAGNOSIS — N63 Unspecified lump in unspecified breast: Secondary | ICD-10-CM | POA: Diagnosis not present

## 2020-06-14 DIAGNOSIS — Z23 Encounter for immunization: Secondary | ICD-10-CM | POA: Diagnosis not present

## 2020-06-14 DIAGNOSIS — N6489 Other specified disorders of breast: Secondary | ICD-10-CM | POA: Diagnosis not present

## 2020-06-14 DIAGNOSIS — Z170 Estrogen receptor positive status [ER+]: Secondary | ICD-10-CM | POA: Diagnosis not present

## 2020-06-14 DIAGNOSIS — Z7982 Long term (current) use of aspirin: Secondary | ICD-10-CM | POA: Diagnosis not present

## 2020-06-14 DIAGNOSIS — I132 Hypertensive heart and chronic kidney disease with heart failure and with stage 5 chronic kidney disease, or end stage renal disease: Secondary | ICD-10-CM | POA: Diagnosis not present

## 2020-06-14 DIAGNOSIS — N641 Fat necrosis of breast: Secondary | ICD-10-CM | POA: Diagnosis not present

## 2020-06-14 DIAGNOSIS — E1122 Type 2 diabetes mellitus with diabetic chronic kidney disease: Secondary | ICD-10-CM | POA: Diagnosis not present

## 2020-06-14 DIAGNOSIS — N186 End stage renal disease: Secondary | ICD-10-CM | POA: Diagnosis not present

## 2020-06-14 DIAGNOSIS — C50911 Malignant neoplasm of unspecified site of right female breast: Secondary | ICD-10-CM | POA: Diagnosis not present

## 2020-06-14 DIAGNOSIS — E1129 Type 2 diabetes mellitus with other diabetic kidney complication: Secondary | ICD-10-CM | POA: Diagnosis not present

## 2020-06-14 DIAGNOSIS — I5030 Unspecified diastolic (congestive) heart failure: Secondary | ICD-10-CM | POA: Diagnosis not present

## 2020-06-14 DIAGNOSIS — Z4001 Encounter for prophylactic removal of breast: Secondary | ICD-10-CM | POA: Diagnosis not present

## 2020-06-14 DIAGNOSIS — Z79899 Other long term (current) drug therapy: Secondary | ICD-10-CM | POA: Diagnosis not present

## 2020-06-14 DIAGNOSIS — Z992 Dependence on renal dialysis: Secondary | ICD-10-CM | POA: Diagnosis not present

## 2020-06-14 DIAGNOSIS — D509 Iron deficiency anemia, unspecified: Secondary | ICD-10-CM | POA: Diagnosis not present

## 2020-06-14 DIAGNOSIS — N2581 Secondary hyperparathyroidism of renal origin: Secondary | ICD-10-CM | POA: Diagnosis not present

## 2020-06-14 DIAGNOSIS — D631 Anemia in chronic kidney disease: Secondary | ICD-10-CM | POA: Diagnosis not present

## 2020-06-14 DIAGNOSIS — E119 Type 2 diabetes mellitus without complications: Secondary | ICD-10-CM | POA: Diagnosis not present

## 2020-06-14 DIAGNOSIS — Z7984 Long term (current) use of oral hypoglycemic drugs: Secondary | ICD-10-CM | POA: Diagnosis not present

## 2020-06-14 DIAGNOSIS — N6012 Diffuse cystic mastopathy of left breast: Secondary | ICD-10-CM | POA: Diagnosis not present

## 2020-06-14 NOTE — H&P (Signed)
Megan [name] is an 59 y.o. female.   Chief Complaint: Right breast carcinoma HPI: Patient is a 59 year old black female status post right modified radical mastectomy who now presents for prophylactic left simple mastectomy.  Past Medical History:  Diagnosis Date  . (HFpEF) heart failure with preserved ejection fraction ([place])    a. 01/2019 Echo: EF 55-60%, mild conc LVH. DD.  Torn MV chordae.  . Anemia   . Atypical chest pain    a. 08/2018 MV: EF 59%, no ischemia; b. 02/2019 Cath: nonobs dzs.  . Blood transfusion without reported diagnosis   . Cataract   . ESRD (end stage renal disease) on dialysis ([place])    a. HD T, T, S  . Essential hypertension, benign   . GERD (gastroesophageal reflux disease)   . Headache   . Hemorrhoids   . Mixed hyperlipidemia   . Morbid obesity ([place])   . Non-obstructive CAD (coronary artery disease)    a. 02/2019 CathL LM nl, LAD 56m, LCX nl, RCA 25p, EF 55-65%.  Marland Kitchen PONV (postoperative nausea and vomiting)   . S/P colonoscopy Jan 2011   Dr. Benson Norway: sessile polyp (benign lymphoid), large hemorrhoids, repeat 5-10 years  . Temporal arteritis ([place])   . Type 2 diabetes mellitus ([place])   . Wears glasses     Past Surgical History:  Procedure Laterality Date  . ABDOMINAL HYSTERECTOMY    . APPENDECTOMY    . ARTERY BIOPSY N/A 05/09/2018   Procedure: RIGHT TEMPORAL ARTERY BIOPSY;  Surgeon: Judeth Horn, MD;  Location: [facility];  Service: General;  Laterality: N/A;  . CATARACT EXTRACTION W/PHACO Left 02/09/2017   Procedure: CATARACT EXTRACTION PHACO AND INTRAOCULAR LENS PLACEMENT LEFT EYE;  Surgeon: Tonny Branch, MD;  Location: [facility];  Service: Ophthalmology;  Laterality: Left;  CDE: 4.89  . CATARACT EXTRACTION W/PHACO Right 06/04/2017   Procedure: CATARACT EXTRACTION PHACO AND INTRAOCULAR LENS PLACEMENT (IOC);  Surgeon: Tonny Branch, MD;  Location: [facility];  Service: Ophthalmology;  Laterality: Right;  CDE: 4.12  . CHOLECYSTECTOMY  09/29/2011   Procedure: LAPAROSCOPIC  CHOLECYSTECTOMY;  Surgeon: Jamesetta So, MD;  Location: [facility];  Service: General;  Laterality: N/A;  . COLONOSCOPY  Jan 2011   Dr. Benson Norway: sessile polyp (benign lymphoid), large hemorrhoids, repeat 5-10 years  . COLONOSCOPY N/A 06/12/2016   Procedure: COLONOSCOPY;  Surgeon: Daneil Dolin, MD;  [facility];  Service: Endoscopy;  Laterality: N/A;  1230   . ESOPHAGOGASTRODUODENOSCOPY  09/05/2011   ERX:VQMGQ hiatal hernia; remainder of exam normal. No explanation for patient's abdominal pain with today's examination  . ESOPHAGOGASTRODUODENOSCOPY N/A 12/17/2013   Dr. [name]: gastric erythema, erosion, mild chronic inflammation on path   . LAPAROSCOPIC APPENDECTOMY  09/29/2011   Procedure: APPENDECTOMY LAPAROSCOPIC;  Surgeon: Jamesetta So, MD;  Location: [facility];  Service: General;;  incidental appendectomy  . LEFT HEART CATH AND CORONARY ANGIOGRAPHY N/A 02/28/2019   Procedure: LEFT HEART CATH AND CORONARY ANGIOGRAPHY;  Surgeon: Jettie Booze, MD;  Location: [facility];  Service: Cardiovascular;  Laterality: N/A;  . MASTECTOMY MODIFIED RADICAL Right 02/18/2020   Procedure: MASTECTOMY MODIFIED RADICAL;  Surgeon: Aviva Signs, MD;  [facility];  Service: General;  Laterality: Right;  . PARTIAL MASTECTOMY WITH NEEDLE LOCALIZATION AND AXILLARY SENTINEL LYMPH NODE BX Right 09/18/2018   Procedure: RIGHT PARTIAL MASTECTOMY AFTER NEEDLE LOCALIZATION, SENTINEL LYMPH NODE BIOPSY RIGHT AXILLA;  Surgeon: Aviva Signs, MD;  Location: [facility];  Service: General;  Laterality: Right;  Family History  Problem Relation Age of Onset  . Hypertension Mother   . Coronary artery disease Mother   . Diabetes Mother   . Hypertension Sister   . Coronary artery disease Sister   . Hypertension Brother   . Heart attack Father   . Hypertension Son   . Heart attack Maternal Aunt   . Hypertension Maternal Aunt   . Diabetes Maternal Aunt   . Heart attack Maternal Uncle   . Hypertension Maternal  Uncle   . Diabetes Maternal Uncle   . Heart attack Paternal Aunt   . Hypertension Paternal Aunt   . Diabetes Paternal Aunt   . Heart attack Paternal Uncle   . Hypertension Paternal Uncle   . Diabetes Paternal Uncle   . Heart attack Maternal Grandmother   . Heart attack Maternal Grandfather   . Heart attack Paternal Grandmother   . Heart attack Paternal Grandfather   . Colon cancer Neg Hx    Social History:  reports that she has never smoked. She has never used smokeless tobacco. She reports that she does not drink alcohol and does not use drugs.  Allergies:  Allergies  Allergen Reactions  . Hydrocodone Itching    No medications prior to admission.    No results found for this or any previous visit (from the past 48 hour(s)). No results found.  Review of Systems  Constitutional: Positive for fatigue.  HENT: Negative.   Eyes: Negative.   Respiratory: Negative.   Cardiovascular: Negative.   Gastrointestinal: Negative.   Endocrine: Negative.   Musculoskeletal: Negative.   Allergic/Immunologic: Negative.   Neurological: Negative.   Hematological: Negative.   Psychiatric/Behavioral: Negative.     There were no vitals taken for this visit. Physical Exam Vitals reviewed.  Constitutional:      Appearance: Normal appearance. She is not ill-appearing.  HENT:     Head: Normocephalic and atraumatic.  Cardiovascular:     Rate and Rhythm: Normal rate and regular rhythm.     Heart sounds: Normal heart sounds. No murmur heard.  No friction rub. No gallop.   Pulmonary:     Effort: Pulmonary effort is normal. No respiratory distress.     Breath sounds: Normal breath sounds. No stridor. No wheezing, rhonchi or rales.  Skin:    General: Skin is warm and dry.  Neurological:     Mental Status: She is alert and oriented to person, place, and time.   Breast: Well-healed right modified radical surgical scar.  No dominant mass, nipple discharge, dimpling in the left  breast.  Assessment/Plan Impression: Right breast carcinoma, desire for prophylactic left mastectomy Plan: Patient is scheduled for left simple mastectomy on 06/15/2020.  The risks and benefits of the procedure including bleeding, infection, and cardiopulmonary difficulties were fully explained to the patient, who gave informed consent. Aviva Signs, MD 06/14/2020, 5:13 PM

## 2020-06-15 ENCOUNTER — Ambulatory Visit (HOSPITAL_COMMUNITY): Payer: Medicare Other | Admitting: Anesthesiology

## 2020-06-15 ENCOUNTER — Observation Stay (HOSPITAL_COMMUNITY)
Admission: RE | Admit: 2020-06-15 | Discharge: 2020-06-16 | Disposition: A | Discharge status: Home / Self Care | Payer: Medicare Other | Attending: General Surgery | Admitting: General Surgery

## 2020-06-15 ENCOUNTER — Other Ambulatory Visit: Payer: Self-pay

## 2020-06-15 ENCOUNTER — Encounter (HOSPITAL_COMMUNITY): Payer: Self-pay | Admitting: General Surgery

## 2020-06-15 ENCOUNTER — Encounter (HOSPITAL_COMMUNITY)
Admission: RE | Disposition: A | Discharge status: Home / Self Care | Payer: Self-pay | Source: Home / Self Care | Attending: General Surgery

## 2020-06-15 DIAGNOSIS — I132 Hypertensive heart and chronic kidney disease with heart failure and with stage 5 chronic kidney disease, or end stage renal disease: Secondary | ICD-10-CM | POA: Insufficient documentation

## 2020-06-15 DIAGNOSIS — Z170 Estrogen receptor positive status [ER+]: Secondary | ICD-10-CM | POA: Diagnosis not present

## 2020-06-15 DIAGNOSIS — N63 Unspecified lump in unspecified breast: Principal | ICD-10-CM | POA: Insufficient documentation

## 2020-06-15 DIAGNOSIS — E1122 Type 2 diabetes mellitus with diabetic chronic kidney disease: Secondary | ICD-10-CM | POA: Diagnosis not present

## 2020-06-15 DIAGNOSIS — N6012 Diffuse cystic mastopathy of left breast: Secondary | ICD-10-CM | POA: Diagnosis not present

## 2020-06-15 DIAGNOSIS — Z7984 Long term (current) use of oral hypoglycemic drugs: Secondary | ICD-10-CM | POA: Diagnosis not present

## 2020-06-15 DIAGNOSIS — I5030 Unspecified diastolic (congestive) heart failure: Secondary | ICD-10-CM | POA: Insufficient documentation

## 2020-06-15 DIAGNOSIS — Z992 Dependence on renal dialysis: Secondary | ICD-10-CM | POA: Diagnosis not present

## 2020-06-15 DIAGNOSIS — Z9012 Acquired absence of left breast and nipple: Secondary | ICD-10-CM | POA: Diagnosis not present

## 2020-06-15 DIAGNOSIS — C50211 Malignant neoplasm of upper-inner quadrant of right female breast: Secondary | ICD-10-CM

## 2020-06-15 DIAGNOSIS — N2581 Secondary hyperparathyroidism of renal origin: Secondary | ICD-10-CM | POA: Diagnosis not present

## 2020-06-15 DIAGNOSIS — N641 Fat necrosis of breast: Secondary | ICD-10-CM | POA: Diagnosis not present

## 2020-06-15 DIAGNOSIS — N186 End stage renal disease: Secondary | ICD-10-CM | POA: Diagnosis not present

## 2020-06-15 DIAGNOSIS — C50911 Malignant neoplasm of unspecified site of right female breast: Secondary | ICD-10-CM | POA: Insufficient documentation

## 2020-06-15 DIAGNOSIS — Z79899 Other long term (current) drug therapy: Secondary | ICD-10-CM | POA: Insufficient documentation

## 2020-06-15 DIAGNOSIS — Z7982 Long term (current) use of aspirin: Secondary | ICD-10-CM | POA: Insufficient documentation

## 2020-06-15 DIAGNOSIS — I509 Heart failure, unspecified: Secondary | ICD-10-CM | POA: Diagnosis not present

## 2020-06-15 DIAGNOSIS — Z4001 Encounter for prophylactic removal of breast: Secondary | ICD-10-CM

## 2020-06-15 DIAGNOSIS — N250 Renal osteodystrophy: Secondary | ICD-10-CM | POA: Diagnosis not present

## 2020-06-15 DIAGNOSIS — I120 Hypertensive chronic kidney disease with stage 5 chronic kidney disease or end stage renal disease: Secondary | ICD-10-CM | POA: Diagnosis not present

## 2020-06-15 DIAGNOSIS — N6489 Other specified disorders of breast: Secondary | ICD-10-CM | POA: Diagnosis not present

## 2020-06-15 DIAGNOSIS — E1129 Type 2 diabetes mellitus with other diabetic kidney complication: Secondary | ICD-10-CM | POA: Diagnosis not present

## 2020-06-15 HISTORY — PX: SIMPLE MASTECTOMY WITH AXILLARY SENTINEL NODE BIOPSY: SHX6098

## 2020-06-15 HISTORY — PX: BREAST BIOPSY: SHX20

## 2020-06-15 LAB — POCT I-STAT, CHEM 8
BUN: 45 mg/dL — ABNORMAL HIGH (ref 6–20)
Calcium, Ion: 1.17 mmol/L (ref 1.15–1.40)
Chloride: 100 mmol/L (ref 98–111)
Creatinine, Ser: 8.9 mg/dL — ABNORMAL HIGH (ref 0.44–1.00)
Glucose, Bld: 110 mg/dL — ABNORMAL HIGH (ref 70–99)
HCT: 31 % — ABNORMAL LOW (ref 36.0–46.0)
Hemoglobin: 10.5 g/dL — ABNORMAL LOW (ref 12.0–15.0)
Potassium: 3.6 mmol/L (ref 3.5–5.1)
Sodium: 138 mmol/L (ref 135–145)
TCO2: 27 mmol/L (ref 22–32)

## 2020-06-15 LAB — GLUCOSE, CAPILLARY: Glucose-Capillary: 101 mg/dL — ABNORMAL HIGH (ref 70–99)

## 2020-06-15 SURGERY — SIMPLE MASTECTOMY
Anesthesia: General | Site: Breast | Laterality: Right

## 2020-06-15 MED ORDER — PHENYLEPHRINE HCL (PRESSORS) 10 MG/ML IV SOLN
INTRAVENOUS | Status: DC | PRN
  Administered 2020-06-15 (×4): 100 ug via INTRAVENOUS

## 2020-06-15 MED ORDER — RENA-VITE PO TABS
1.0000 | ORAL_TABLET | Freq: Every day | ORAL | Status: DC
  Administered 2020-06-15: 1 via ORAL
  Filled 2020-06-15 (×2): qty 1

## 2020-06-15 MED ORDER — AMLODIPINE BESYLATE 5 MG PO TABS
10.0000 mg | ORAL_TABLET | Freq: Every day | ORAL | Status: DC
  Administered 2020-06-15: 10 mg via ORAL
  Filled 2020-06-15: qty 2

## 2020-06-15 MED ORDER — MIDAZOLAM HCL 5 MG/5ML IJ SOLN
INTRAMUSCULAR | Status: DC | PRN
  Administered 2020-06-15: 2 mg via INTRAVENOUS

## 2020-06-15 MED ORDER — PANTOPRAZOLE SODIUM 40 MG PO TBEC
40.0000 mg | DELAYED_RELEASE_TABLET | Freq: Every day | ORAL | Status: DC
  Administered 2020-06-15: 40 mg via ORAL
  Filled 2020-06-15 (×2): qty 1

## 2020-06-15 MED ORDER — LANTHANUM CARBONATE 500 MG PO CHEW
3000.0000 mg | CHEWABLE_TABLET | Freq: Three times a day (TID) | ORAL | Status: DC
  Administered 2020-06-16 (×2): 3000 mg via ORAL
  Filled 2020-06-15 (×10): qty 6

## 2020-06-15 MED ORDER — HYDROCODONE-ACETAMINOPHEN 5-325 MG PO TABS: 1.0000 | ORAL_TABLET | ORAL | Status: DC | PRN

## 2020-06-15 MED ORDER — ONDANSETRON HCL 4 MG/2ML IJ SOLN: 4.0000 mg | Freq: Four times a day (QID) | INTRAMUSCULAR | Status: DC | PRN

## 2020-06-15 MED ORDER — NEBIVOLOL HCL 10 MG PO TABS
10.0000 mg | ORAL_TABLET | Freq: Every day | ORAL | Status: DC
  Filled 2020-06-15: qty 1

## 2020-06-15 MED ORDER — DEXAMETHASONE SODIUM PHOSPHATE 10 MG/ML IJ SOLN
INTRAMUSCULAR | Status: DC | PRN
  Administered 2020-06-15: 4 mg via INTRAVENOUS

## 2020-06-15 MED ORDER — GABAPENTIN 300 MG PO CAPS
300.0000 mg | ORAL_CAPSULE | Freq: Every day | ORAL | Status: DC
  Administered 2020-06-15: 300 mg via ORAL
  Filled 2020-06-15: qty 1

## 2020-06-15 MED ORDER — POVIDONE-IODINE 10 % EX OINT
TOPICAL_OINTMENT | CUTANEOUS | Status: AC
  Filled 2020-06-15: qty 2

## 2020-06-15 MED ORDER — SEVOFLURANE IN SOLN
RESPIRATORY_TRACT | Status: AC
  Filled 2020-06-15: qty 250

## 2020-06-15 MED ORDER — LIDOCAINE HCL (CARDIAC) PF 100 MG/5ML IV SOSY
PREFILLED_SYRINGE | INTRAVENOUS | Status: DC | PRN
  Administered 2020-06-15: 100 mg via INTRAVENOUS

## 2020-06-15 MED ORDER — LACTATED RINGERS IV SOLN
INTRAVENOUS | Status: DC
  Administered 2020-06-15: 1000 mL via INTRAVENOUS

## 2020-06-15 MED ORDER — SODIUM CHLORIDE 0.9 % IV SOLN: INTRAVENOUS | Status: DC

## 2020-06-15 MED ORDER — PROPOFOL 10 MG/ML IV BOLUS
INTRAVENOUS | Status: DC | PRN
  Administered 2020-06-15: 150 mg via INTRAVENOUS

## 2020-06-15 MED ORDER — ASPIRIN EC 81 MG PO TBEC
81.0000 mg | DELAYED_RELEASE_TABLET | Freq: Every day | ORAL | Status: DC
  Administered 2020-06-15: 81 mg via ORAL
  Filled 2020-06-15 (×2): qty 1

## 2020-06-15 MED ORDER — LIDOCAINE 2% (20 MG/ML) 5 ML SYRINGE
INTRAMUSCULAR | Status: AC
  Filled 2020-06-15: qty 5

## 2020-06-15 MED ORDER — DULOXETINE HCL 30 MG PO CPEP
30.0000 mg | ORAL_CAPSULE | Freq: Every day | ORAL | Status: DC
  Filled 2020-06-15: qty 1

## 2020-06-15 MED ORDER — FENTANYL CITRATE (PF) 100 MCG/2ML IJ SOLN
INTRAMUSCULAR | Status: AC
  Filled 2020-06-15: qty 2

## 2020-06-15 MED ORDER — HEPARIN SODIUM (PORCINE) 5000 UNIT/ML IJ SOLN
5000.0000 [IU] | Freq: Three times a day (TID) | INTRAMUSCULAR | Status: DC
  Administered 2020-06-15 – 2020-06-16 (×3): 5000 [IU] via SUBCUTANEOUS
  Filled 2020-06-15 (×3): qty 1

## 2020-06-15 MED ORDER — CHLORHEXIDINE GLUCONATE CLOTH 2 % EX PADS
6.0000 | MEDICATED_PAD | Freq: Every day | CUTANEOUS | Status: DC
  Administered 2020-06-16: 6 via TOPICAL

## 2020-06-15 MED ORDER — BUPIVACAINE HCL (PF) 0.5 % IJ SOLN
INTRAMUSCULAR | Status: DC | PRN
  Administered 2020-06-15: 10 mL

## 2020-06-15 MED ORDER — BUPIVACAINE LIPOSOME 1.3 % IJ SUSP
INTRAMUSCULAR | Status: AC
  Filled 2020-06-15: qty 20

## 2020-06-15 MED ORDER — NEBIVOLOL HCL 10 MG PO TABS: 10.0000 mg | ORAL_TABLET | Freq: Every day | ORAL | Status: DC

## 2020-06-15 MED ORDER — ONDANSETRON 4 MG PO TBDP: 4.0000 mg | ORAL_TABLET | Freq: Four times a day (QID) | ORAL | Status: DC | PRN

## 2020-06-15 MED ORDER — MIDAZOLAM HCL 2 MG/2ML IJ SOLN
INTRAMUSCULAR | Status: AC
  Filled 2020-06-15: qty 2

## 2020-06-15 MED ORDER — ACETAMINOPHEN 650 MG RE SUPP: 650.0000 mg | Freq: Four times a day (QID) | RECTAL | Status: DC | PRN

## 2020-06-15 MED ORDER — CHLORHEXIDINE GLUCONATE CLOTH 2 % EX PADS: 6.0000 | MEDICATED_PAD | Freq: Once | CUTANEOUS | Status: DC

## 2020-06-15 MED ORDER — METOCLOPRAMIDE HCL 5 MG/ML IJ SOLN
INTRAMUSCULAR | Status: AC
  Filled 2020-06-15: qty 2

## 2020-06-15 MED ORDER — CEFAZOLIN SODIUM-DEXTROSE 2-4 GM/100ML-% IV SOLN
2.0000 g | INTRAVENOUS | Status: AC
  Administered 2020-06-15: 2 g via INTRAVENOUS
  Filled 2020-06-15: qty 100

## 2020-06-15 MED ORDER — METOCLOPRAMIDE HCL 5 MG/ML IJ SOLN
INTRAMUSCULAR | Status: DC | PRN
  Administered 2020-06-15 (×2): 5 mg via INTRAVENOUS

## 2020-06-15 MED ORDER — CALCITRIOL 0.25 MCG PO CAPS
1.0000 ug | ORAL_CAPSULE | ORAL | Status: DC
  Administered 2020-06-16: 1 ug via ORAL
  Filled 2020-06-15: qty 4

## 2020-06-15 MED ORDER — PHENYLEPHRINE 40 MCG/ML (10ML) SYRINGE FOR IV PUSH (FOR BLOOD PRESSURE SUPPORT)
PREFILLED_SYRINGE | INTRAVENOUS | Status: AC
  Filled 2020-06-15: qty 10

## 2020-06-15 MED ORDER — ONDANSETRON HCL 4 MG/2ML IJ SOLN
INTRAMUSCULAR | Status: DC | PRN
  Administered 2020-06-15: 4 mg via INTRAVENOUS

## 2020-06-15 MED ORDER — ACETAMINOPHEN 325 MG PO TABS: 650.0000 mg | ORAL_TABLET | Freq: Four times a day (QID) | ORAL | Status: DC | PRN

## 2020-06-15 MED ORDER — ONDANSETRON HCL 4 MG/2ML IJ SOLN
INTRAMUSCULAR | Status: AC
  Filled 2020-06-15: qty 2

## 2020-06-15 MED ORDER — CINACALCET HCL 30 MG PO TABS
30.0000 mg | ORAL_TABLET | Freq: Every day | ORAL | Status: DC
  Administered 2020-06-15: 30 mg via ORAL
  Filled 2020-06-15: qty 1

## 2020-06-15 MED ORDER — ATORVASTATIN CALCIUM 20 MG PO TABS
20.0000 mg | ORAL_TABLET | Freq: Every day | ORAL | Status: DC
  Administered 2020-06-15: 20 mg via ORAL
  Filled 2020-06-15: qty 1

## 2020-06-15 MED ORDER — HYDROMORPHONE HCL 1 MG/ML IJ SOLN
1.0000 mg | INTRAMUSCULAR | Status: DC | PRN
  Administered 2020-06-15: 1 mg via INTRAVENOUS
  Filled 2020-06-15: qty 1

## 2020-06-15 MED ORDER — PROPOFOL 10 MG/ML IV BOLUS
INTRAVENOUS | Status: AC
  Filled 2020-06-15: qty 40

## 2020-06-15 MED ORDER — 0.9 % SODIUM CHLORIDE (POUR BTL) OPTIME
TOPICAL | Status: DC | PRN
  Administered 2020-06-15: 1000 mL

## 2020-06-15 MED ORDER — FENTANYL CITRATE (PF) 100 MCG/2ML IJ SOLN
INTRAMUSCULAR | Status: DC | PRN
  Administered 2020-06-15: 50 ug via INTRAVENOUS
  Administered 2020-06-15: 100 ug via INTRAVENOUS

## 2020-06-15 MED ORDER — ONDANSETRON HCL 4 MG/2ML IJ SOLN: 4.0000 mg | Freq: Once | INTRAMUSCULAR | Status: DC | PRN

## 2020-06-15 MED ORDER — ORAL CARE MOUTH RINSE: 15.0000 mL | Freq: Once | OROMUCOSAL | Status: AC

## 2020-06-15 MED ORDER — LANTHANUM CARBONATE 500 MG PO CHEW
2000.0000 mg | CHEWABLE_TABLET | Freq: Two times a day (BID) | ORAL | Status: DC | PRN
  Filled 2020-06-15: qty 4

## 2020-06-15 MED ORDER — BUPIVACAINE HCL (PF) 0.5 % IJ SOLN
INTRAMUSCULAR | Status: AC
  Filled 2020-06-15: qty 30

## 2020-06-15 MED ORDER — CHLORHEXIDINE GLUCONATE 0.12 % MT SOLN
15.0000 mL | Freq: Once | OROMUCOSAL | Status: AC
  Administered 2020-06-15: 15 mL via OROMUCOSAL

## 2020-06-15 MED ORDER — FENTANYL CITRATE (PF) 100 MCG/2ML IJ SOLN
25.0000 ug | INTRAMUSCULAR | Status: DC | PRN
  Administered 2020-06-15 (×3): 25 ug via INTRAVENOUS
  Filled 2020-06-15: qty 2

## 2020-06-15 MED ORDER — DEXAMETHASONE SODIUM PHOSPHATE 10 MG/ML IJ SOLN
INTRAMUSCULAR | Status: AC
  Filled 2020-06-15: qty 1

## 2020-06-15 SURGICAL SUPPLY — 46 items
ADH SKN CLS APL DERMABOND .7 (GAUZE/BANDAGES/DRESSINGS) ×2
APL PRP STRL LF DISP 70% ISPRP (MISCELLANEOUS) ×2
BINDER BREAST XLRG (GAUZE/BANDAGES/DRESSINGS) ×1 IMPLANT
CHLORAPREP W/TINT 26 (MISCELLANEOUS) ×3 IMPLANT
CLOTH BEACON ORANGE TIMEOUT ST (SAFETY) ×3 IMPLANT
COVER LIGHT HANDLE STERIS (MISCELLANEOUS) ×6 IMPLANT
COVER WAND RF STERILE (DRAPES) ×3 IMPLANT
DECANTER SPIKE VIAL GLASS SM (MISCELLANEOUS) ×1 IMPLANT
DERMABOND ADVANCED (GAUZE/BANDAGES/DRESSINGS) ×1
DERMABOND ADVANCED .7 DNX12 (GAUZE/BANDAGES/DRESSINGS) ×2 IMPLANT
ELECT REM PT RETURN 9FT ADLT (ELECTROSURGICAL) ×3
ELECTRODE REM PT RTRN 9FT ADLT (ELECTROSURGICAL) ×2 IMPLANT
EVACUATOR DRAINAGE 10X20 100CC (DRAIN) IMPLANT
EVACUATOR SILICONE 100CC (DRAIN) ×3
GAUZE SPONGE 4X4 12PLY STRL (GAUZE/BANDAGES/DRESSINGS) ×1 IMPLANT
GAUZE SPONGE 4X4 12PLY STRL LF (GAUZE/BANDAGES/DRESSINGS) ×3 IMPLANT
GLOVE BIOGEL PI IND STRL 7.0 (GLOVE) ×4 IMPLANT
GLOVE BIOGEL PI INDICATOR 7.0 (GLOVE) ×2
GLOVE ECLIPSE 6.5 STRL STRAW (GLOVE) ×1 IMPLANT
GLOVE SURG SS PI 7.5 STRL IVOR (GLOVE) ×3 IMPLANT
GOWN STRL REUS W/TWL LRG LVL3 (GOWN DISPOSABLE) ×9 IMPLANT
KIT TURNOVER KIT A (KITS) ×3 IMPLANT
MANIFOLD NEPTUNE II (INSTRUMENTS) ×3 IMPLANT
NDL HYPO 21X1.5 SAFETY (NEEDLE) IMPLANT
NEEDLE HYPO 21X1.5 SAFETY (NEEDLE) ×3 IMPLANT
NS IRRIG 1000ML POUR BTL (IV SOLUTION) ×3 IMPLANT
PACK MINOR (CUSTOM PROCEDURE TRAY) ×3 IMPLANT
PAD ABD 5X9 TENDERSORB (GAUZE/BANDAGES/DRESSINGS) ×2 IMPLANT
PAD ABD 8X10 STRL (GAUZE/BANDAGES/DRESSINGS) ×3 IMPLANT
PAD ARMBOARD 7.5X6 YLW CONV (MISCELLANEOUS) ×3 IMPLANT
SET BASIN LINEN APH (SET/KITS/TRAYS/PACK) ×3 IMPLANT
SPONGE DRAIN TRACH 4X4 STRL 2S (GAUZE/BANDAGES/DRESSINGS) ×1 IMPLANT
SPONGE GAUZE 4X4 12PLY (GAUZE/BANDAGES/DRESSINGS) ×1 IMPLANT
SPONGE LAP 18X18 RF (DISPOSABLE) ×3 IMPLANT
SUT ETHILON 3 0 FSL (SUTURE) ×1 IMPLANT
SUT MNCRL AB 4-0 PS2 18 (SUTURE) ×4 IMPLANT
SUT SILK 2 0 (SUTURE) ×3
SUT SILK 2 0 FSL 18 (SUTURE) ×1 IMPLANT
SUT SILK 2-0 18XBRD TIE 12 (SUTURE) IMPLANT
SUT VIC AB 2-0 CT1 27 (SUTURE) ×24
SUT VIC AB 2-0 CT1 TAPERPNT 27 (SUTURE) IMPLANT
SUT VIC AB 3-0 SH 27 (SUTURE) ×6
SUT VIC AB 3-0 SH 27X BRD (SUTURE) ×2 IMPLANT
SYR 20ML LL LF (SYRINGE) ×1 IMPLANT
SYR BULB IRRIG 60ML STRL (SYRINGE) ×3 IMPLANT
SYR CONTROL 10ML LL (SYRINGE) ×1 IMPLANT

## 2020-06-15 NOTE — Transfer of Care (Signed)
Immediate Anesthesia Transfer of Care Note  Patient: Megan [name]  Procedure(s) Performed: LEFT SIMPLE MASTECTOMY (Left Breast) RIGHT BREAST BIOPSY (Right Breast)  Patient Location: PACU  Anesthesia Type:General  Level of Consciousness: awake, alert , oriented, drowsy and patient cooperative  Airway & Oxygen Therapy: Patient Spontanous Breathing and Patient connected to nasal cannula oxygen  Post-op Assessment: Report given to RN, Post -op Vital signs reviewed and stable and Patient moving all extremities X 4  Post vital signs: Reviewed and stable  Last Vitals:  Vitals Value Taken Time  BP 117/72 06/15/20 0908  Temp    Pulse 67 06/15/20 0911  Resp 10 06/15/20 0911  SpO2 95 % 06/15/20 0911  Vitals shown include unvalidated device data.  Last Pain:  Vitals:   06/15/20 0703  TempSrc: Oral  PainSc: 0-No pain      Patients Stated Pain Goal: 8 (06/29/51 0802)  Complications: No complications documented.

## 2020-06-15 NOTE — Consult Note (Signed)
Reason for Consult: To manage dialysis and dialysis related needs Referring Physician: [name] is an 59 y.o. female with HTN, T2 DM, history of breast cancer with recurrence in right breast and ESRD.  She presents today for an electve left mastectomy due to increased cancer risk.  I saw her post op-  Her pain is controlled- she is somnolent- had HD yesterday as OP.  Dr. Arnoldo Morale suggested that we do HD here tomorrow prior to discharge.  It appears that she is normally compliant with her HD   Dialyzes at [name]- MWF 3.5 hours  EDW 78.5. HD [facility] 2/2.5, Dialyzer 180, 400 BFR- Heparin yes. Access left AVF-  15 guage.,  darbe 80 mcg weekly, venofer 50 weekly, calcitriol 1 mcg tiw  Past Medical History:  Diagnosis Date  . (HFpEF) heart failure with preserved ejection fraction (Megan [name])    a. 01/2019 Echo: EF 55-60%, mild conc LVH. DD.  Torn MV chordae.  . Anemia   . Atypical chest pain    a. 08/2018 MV: EF 59%, no ischemia; b. 02/2019 Cath: nonobs dzs.  . Blood transfusion without reported diagnosis   . Cataract   . ESRD (end stage renal disease) on dialysis (Megan [name])    a. HD T, T, S  . Essential hypertension, benign   . GERD (gastroesophageal reflux disease)   . Headache   . Hemorrhoids   . Mixed hyperlipidemia   . Morbid obesity (Megan [name])   . Non-obstructive CAD (coronary artery disease)    a. 02/2019 CathL LM nl, LAD 11m, LCX nl, RCA 25p, EF 55-65%.  Marland Kitchen PONV (postoperative nausea and vomiting)   . S/P colonoscopy Jan 2011   Dr. Benson Norway: sessile polyp (benign lymphoid), large hemorrhoids, repeat 5-10 years  . Temporal arteritis (Megan [name])   . Type 2 diabetes mellitus (Megan [name])   . Wears glasses     Past Surgical History:  Procedure Laterality Date  . ABDOMINAL HYSTERECTOMY    . APPENDECTOMY    . ARTERY BIOPSY N/A 05/09/2018   Procedure: RIGHT TEMPORAL ARTERY BIOPSY;  Surgeon: Judeth Horn, MD;  Location: [name];  Service: General;  Laterality: N/A;  . CATARACT EXTRACTION W/PHACO Left  02/09/2017   Procedure: CATARACT EXTRACTION PHACO AND INTRAOCULAR LENS PLACEMENT LEFT EYE;  Surgeon: Tonny Branch, MD;  Location: [name];  Service: Ophthalmology;  Laterality: Left;  CDE: 4.89  . CATARACT EXTRACTION W/PHACO Right 06/04/2017   Procedure: CATARACT EXTRACTION PHACO AND INTRAOCULAR LENS PLACEMENT (IOC);  Surgeon: Tonny Branch, MD;  Location: [name];  Service: Ophthalmology;  Laterality: Right;  CDE: 4.12  . CHOLECYSTECTOMY  09/29/2011   Procedure: LAPAROSCOPIC CHOLECYSTECTOMY;  Surgeon: Jamesetta So, MD;  Location: [name];  Service: General;  Laterality: N/A;  . COLONOSCOPY  Jan 2011   Dr. Benson Norway: sessile polyp (benign lymphoid), large hemorrhoids, repeat 5-10 years  . COLONOSCOPY N/A 06/12/2016   Procedure: COLONOSCOPY;  Surgeon: Daneil Dolin, MD;  Location: [name];  Service: Endoscopy;  Laterality: N/A;  1230   . ESOPHAGOGASTRODUODENOSCOPY  09/05/2011   Megan [name]:IOXBD hiatal hernia; remainder of exam normal. No explanation for patient's abdominal pain with today's examination  . ESOPHAGOGASTRODUODENOSCOPY N/A 12/17/2013   Dr. [name]: gastric erythema, erosion, mild chronic inflammation on path   . LAPAROSCOPIC APPENDECTOMY  09/29/2011   Procedure: APPENDECTOMY LAPAROSCOPIC;  Surgeon: Jamesetta So, MD;  Location: [name];  Service: General;;  incidental appendectomy  . LEFT HEART CATH AND CORONARY ANGIOGRAPHY N/A 02/28/2019   Procedure: LEFT HEART  CATH AND CORONARY ANGIOGRAPHY;  Surgeon: Jettie Booze, MD;  Location: [name];  Service: Cardiovascular;  Laterality: N/A;  . MASTECTOMY MODIFIED RADICAL Right 02/18/2020   Procedure: MASTECTOMY MODIFIED RADICAL;  Surgeon: Aviva Signs, MD;  Location: [name];  Service: General;  Laterality: Right;  . PARTIAL MASTECTOMY WITH NEEDLE LOCALIZATION AND AXILLARY SENTINEL LYMPH NODE BX Right 09/18/2018   Procedure: RIGHT PARTIAL MASTECTOMY AFTER NEEDLE LOCALIZATION, SENTINEL LYMPH NODE BIOPSY RIGHT AXILLA;  Surgeon: Aviva Signs,  MD;  Location: [name];  Service: General;  Laterality: Right;    Family History  Problem Relation Age of Onset  . Hypertension Mother   . Coronary artery disease Mother   . Diabetes Mother   . Hypertension Sister   . Coronary artery disease Sister   . Hypertension Brother   . Heart attack Father   . Hypertension Son   . Heart attack Maternal Aunt   . Hypertension Maternal Aunt   . Diabetes Maternal Aunt   . Heart attack Maternal Uncle   . Hypertension Maternal Uncle   . Diabetes Maternal Uncle   . Heart attack Paternal Aunt   . Hypertension Paternal Aunt   . Diabetes Paternal Aunt   . Heart attack Paternal Uncle   . Hypertension Paternal Uncle   . Diabetes Paternal Uncle   . Heart attack Maternal Grandmother   . Heart attack Maternal Grandfather   . Heart attack Paternal Grandmother   . Heart attack Paternal Grandfather   . Colon cancer Neg Hx     Social History:  reports that she has never smoked. She has never used smokeless tobacco. She reports that she does not drink alcohol and does not use drugs.  Allergies: No Active Allergies  Medications: I have reviewed the patient's current medications.  Results for orders placed or performed during the [name] encounter of 06/15/20 (from the past 48 hour(s))  I-STAT, chem 8     Status: Abnormal   Collection Time: 06/15/20  6:52 AM  Result Value Ref Range   Sodium 138 135 - 145 mmol/L   Potassium 3.6 3.5 - 5.1 mmol/L   Chloride 100 98 - 111 mmol/L   BUN 45 (H) 6 - 20 mg/dL   Creatinine, Ser 8.90 (H) 0.44 - 1.00 mg/dL   Glucose, Bld 110 (H) 70 - 99 mg/dL    Comment: Glucose reference range applies only to samples taken after fasting for at least 8 hours.   Calcium, Ion 1.17 1.15 - 1.40 mmol/L   TCO2 27 22 - 32 mmol/L   Hemoglobin 10.5 (L) 12.0 - 15.0 g/dL   HCT 31.0 (L) 36 - 46 %  Glucose, capillary     Status: Abnormal   Collection Time: 06/15/20 10:18 AM  Result Value Ref Range   Glucose-Capillary 101 (H) 70 - 99  mg/dL    Comment: Glucose reference range applies only to samples taken after fasting for at least 8 hours.    No results found.  ROS: post op-  Somnolent - no real complaints Blood pressure (!) 150/90, pulse 74, temperature 97.7 F (36.5 C), temperature source Oral, resp. rate 16, height 5\' 2"  (1.575 m), weight 78.5 kg, SpO2 100 %. General appearance: alert and fatigued Resp: clear to auscultation bilaterally Cardio: regular rate and rhythm, S1, S2 normal, no murmur, click, rub or gallop GI: soft, non-tender; bowel sounds normal; no masses,  no organomegaly Extremities: extremities normal, atraumatic, no cyanosis or edema left lower arm AVF-  positive bruit  Assessment/Plan: 59 year old BF- ESRD-  Presents for elective left mastectomy 1 history of breast CA with recurrence-  now s/p prophylactic mastectomy on left 2 ESRD: normally MWF-  [facility] -  Will plan for routine HD tomorrow  3 Hypertension: BP and volume status OK-  To EDW with HD yesterday.  Continue home norvasc and bystolic 4. Anemia of ESRD: pre op hgb 10.5-  No meds this hosp 5. Metabolic Bone Disease: to continue with home calcitriol, sensipar and fosrenol    Louis Meckel 06/15/2020, 12:32 PM

## 2020-06-15 NOTE — Anesthesia Procedure Notes (Signed)
Procedure Name: LMA Insertion Date/Time: 06/15/2020 7:41 AM Performed by: Jonna Munro, CRNA Pre-anesthesia Checklist: Patient identified, Emergency Drugs available, Suction available, Patient being monitored and Timeout performed Patient Re-evaluated:Patient Re-evaluated prior to induction Oxygen Delivery Method: Circle system utilized Preoxygenation: Pre-oxygenation with 100% oxygen Induction Type: IV induction LMA: LMA inserted LMA Size: 4.0 Number of attempts: 1 Placement Confirmation: positive ETCO2 and breath sounds checked- equal and bilateral Tube secured with: Tape Dental Injury: Teeth and Oropharynx as per pre-operative assessment

## 2020-06-15 NOTE — Op Note (Signed)
Patient:  Megan [name]  DOB:  April 30, 1961  MRN:  [id number]   Preop Diagnosis: Right breast carcinoma, right breast mass  Postop Diagnosis: Same  Procedure: Prophylactic left simple mastectomy, right breast biopsy  Surgeon: Aviva Signs, MD  Anes: General endotracheal  Indications: Patient is a 59 year old black female with multiple medical problems including end-stage renal disease who desires a prophylactic left mastectomy due to her increased risk of recurrent breast cancer.  She had already had a recurrence of her cancer in the right breast.  She also has an unspecified subcutaneous mass in the upper, inner quadrant of the right mastectomy site.  The risks and benefits of both procedures including bleeding, infection, neurovascular injury, and the possibility of recurrence of the breast cancer were fully explained to the patient, who gave informed consent.  Procedure note: The patient was placed in the supine position.  After induction of general endotracheal anesthesia, the upper chest was prepped and draped using usual sterile technique with ChloraPrep.  Surgical site confirmation was performed.  An elliptical incision was made medial lateral around the left nipple.  A superior flap was formed to the clavicle and an inferior flap formed to the chest wall.  The breast was then removed from the chest wall medial to lateral using Bovie electrocautery.  A suture was placed superiorly for orientation purposes.  The left breast was then removed from the operative field.  A bleeding was controlled using Bovie electrocautery.  A #10 flat Jackson-Pratt drain was placed over the chest wall and brought through separate stab wound inferior to the incision line.  It was secured into place using a 3-0 nylon suture.  Subcutaneous layer was reapproximated using 2-0 Vicryl interrupted sutures.  Half percent Sensorcaine was instilled into the surrounding wound.  The skin was closed using staples.   Betadine ointment and a dry sterile dressing was applied.  Next, an incision was made over a subcutaneous mass which measured approximately 1 cm in its greatest diameter.  It was just above the chest wall.  There was excised without difficulty and sent to pathology for further examination.  A bleeding was controlled using Bovie electrocautery.  The subcutaneous layer was reapproximated using 3-0 Vicryl interrupted sutures.  The skin was closed using a 4-0 Monocryl subcuticular suture.  Dermabond was applied.  All tape and needle counts were correct at the end of the procedure.  The patient was extubated in the operating room and transferred to PACU in stable condition.  Complications: None  EBL: 100 cc  Specimen: Left breast, right breast biopsy  Drains: Jackson-Pratt drain to left mastectomy flap

## 2020-06-15 NOTE — Telephone Encounter (Signed)
Please Advise

## 2020-06-15 NOTE — Interval H&P Note (Signed)
History and Physical Interval Note:  06/15/2020 7:15 AM  Megan [name]  has presented today for surgery, with the diagnosis of Right breast cancer, desire for prophylactic left mastectomy.  The various methods of treatment have been discussed with the patient and family. After consideration of risks, benefits and other options for treatment, the patient has consented to  Procedure(s): LEFT SIMPLE MASTECTOMY (Left) as a surgical intervention.  The patient's history has been reviewed, patient examined, no change in status, stable for surgery.  I have reviewed the patient's chart and labs.  Questions were answered to the patient's satisfaction.  Patient also has a tender knot in the right breast, thus will do a right breast biopsy to remove it.  Aviva Signs

## 2020-06-15 NOTE — Interval H&P Note (Signed)
History and Physical Interval Note:  06/15/2020 7:10 AM  Megan [name]  has presented today for surgery, with the diagnosis of Right breast cancer, desire for prophylactic left mastectomy.  The various methods of treatment have been discussed with the patient and family. After consideration of risks, benefits and other options for treatment, the patient has consented to  Procedure(s): LEFT SIMPLE MASTECTOMY (Left) as a surgical intervention.  The patient's history has been reviewed, patient examined, no change in status, stable for surgery.  I have reviewed the patient's chart and labs.  Questions were answered to the patient's satisfaction.     Aviva Signs

## 2020-06-15 NOTE — Anesthesia Postprocedure Evaluation (Signed)
Anesthesia Post Note  Patient: Megan [name]  Procedure(s) Performed: LEFT SIMPLE MASTECTOMY (Left Breast) RIGHT BREAST BIOPSY (Right Breast)  Patient location during evaluation: PACU Anesthesia Type: General Level of consciousness: awake, oriented and patient cooperative Pain management: pain level controlled Vital Signs Assessment: post-procedure vital signs reviewed and stable Respiratory status: spontaneous breathing, respiratory function stable, nonlabored ventilation and patient connected to nasal cannula oxygen Cardiovascular status: stable Postop Assessment: no apparent nausea or vomiting Anesthetic complications: no   No complications documented.   Last Vitals:  Vitals:   06/15/20 0703  BP: (!) 149/83  Pulse: 90  Resp: 20  Temp: 36.6 C  SpO2: 100%    Last Pain:  Vitals:   06/15/20 0703  TempSrc: Oral  PainSc: 0-No pain                 Dereck Agerton

## 2020-06-15 NOTE — Progress Notes (Signed)
JP drain emptied, red in color 8ml obtained, no odor noted.

## 2020-06-15 NOTE — Anesthesia Preprocedure Evaluation (Signed)
Anesthesia Evaluation    History of Anesthesia Complications (+) PONV and history of anesthetic complications  Airway Mallampati: I       Dental  (+) Teeth Intact   Pulmonary shortness of breath,           Cardiovascular hypertension, + CAD and +CHF       Neuro/Psych  Headaches,    GI/Hepatic GERD  Medicated,  Endo/Other  diabetesHypothyroidism   Renal/GU ESRF and DialysisRenal disease     Musculoskeletal   Abdominal   Peds  Hematology  (+) Blood dyscrasia, anemia ,   Anesthesia Other Findings   Reproductive/Obstetrics                             Anesthesia Physical Anesthesia Plan  ASA: III  Anesthesia Plan: General   Post-op Pain Management:    Induction:   PONV Risk Score and Plan: Ondansetron  Airway Management Planned:   Additional Equipment:   Intra-op Plan:   Post-operative Plan:   Informed Consent:   Plan Discussed with:   Anesthesia Plan Comments:         Anesthesia Quick Evaluation

## 2020-06-16 ENCOUNTER — Encounter (HOSPITAL_COMMUNITY): Payer: Self-pay | Admitting: General Surgery

## 2020-06-16 DIAGNOSIS — E1122 Type 2 diabetes mellitus with diabetic chronic kidney disease: Secondary | ICD-10-CM | POA: Diagnosis not present

## 2020-06-16 DIAGNOSIS — Z992 Dependence on renal dialysis: Secondary | ICD-10-CM | POA: Diagnosis not present

## 2020-06-16 DIAGNOSIS — I120 Hypertensive chronic kidney disease with stage 5 chronic kidney disease or end stage renal disease: Secondary | ICD-10-CM | POA: Diagnosis not present

## 2020-06-16 DIAGNOSIS — E1129 Type 2 diabetes mellitus with other diabetic kidney complication: Secondary | ICD-10-CM | POA: Diagnosis not present

## 2020-06-16 DIAGNOSIS — N186 End stage renal disease: Secondary | ICD-10-CM | POA: Diagnosis not present

## 2020-06-16 DIAGNOSIS — C50911 Malignant neoplasm of unspecified site of right female breast: Secondary | ICD-10-CM | POA: Diagnosis not present

## 2020-06-16 DIAGNOSIS — I132 Hypertensive heart and chronic kidney disease with heart failure and with stage 5 chronic kidney disease, or end stage renal disease: Secondary | ICD-10-CM | POA: Diagnosis not present

## 2020-06-16 DIAGNOSIS — Z9012 Acquired absence of left breast and nipple: Secondary | ICD-10-CM | POA: Diagnosis not present

## 2020-06-16 DIAGNOSIS — N250 Renal osteodystrophy: Secondary | ICD-10-CM | POA: Diagnosis not present

## 2020-06-16 DIAGNOSIS — N63 Unspecified lump in unspecified breast: Secondary | ICD-10-CM | POA: Diagnosis not present

## 2020-06-16 DIAGNOSIS — N2581 Secondary hyperparathyroidism of renal origin: Secondary | ICD-10-CM | POA: Diagnosis not present

## 2020-06-16 DIAGNOSIS — I5030 Unspecified diastolic (congestive) heart failure: Secondary | ICD-10-CM | POA: Diagnosis not present

## 2020-06-16 LAB — CBC
HCT: 29.5 % — ABNORMAL LOW (ref 36.0–46.0)
Hemoglobin: 9 g/dL — ABNORMAL LOW (ref 12.0–15.0)
MCH: 29.9 pg (ref 26.0–34.0)
MCHC: 30.5 g/dL (ref 30.0–36.0)
MCV: 98 fL (ref 80.0–100.0)
Platelets: 335 10*3/uL (ref 150–400)
RBC: 3.01 MIL/uL — ABNORMAL LOW (ref 3.87–5.11)
RDW: 17.7 % — ABNORMAL HIGH (ref 11.5–15.5)
WBC: 13.3 10*3/uL — ABNORMAL HIGH (ref 4.0–10.5)
nRBC: 0 % (ref 0.0–0.2)

## 2020-06-16 LAB — BASIC METABOLIC PANEL
Anion gap: 14 (ref 5–15)
BUN: 61 mg/dL — ABNORMAL HIGH (ref 6–20)
CO2: 23 mmol/L (ref 22–32)
Calcium: 8.9 mg/dL (ref 8.9–10.3)
Chloride: 100 mmol/L (ref 98–111)
Creatinine, Ser: 10.21 mg/dL — ABNORMAL HIGH (ref 0.44–1.00)
GFR, Estimated: 4 mL/min — ABNORMAL LOW (ref >60)
Glucose, Bld: 100 mg/dL — ABNORMAL HIGH (ref 70–99)
Potassium: 3.6 mmol/L (ref 3.5–5.1)
Sodium: 137 mmol/L (ref 135–145)

## 2020-06-16 LAB — PHOSPHORUS: Phosphorus: 5 mg/dL — ABNORMAL HIGH (ref 2.5–4.6)

## 2020-06-16 LAB — SURGICAL PATHOLOGY

## 2020-06-16 MED ORDER — LIDOCAINE-PRILOCAINE 2.5-2.5 % EX CREA: 1.0000 "application " | TOPICAL_CREAM | CUTANEOUS | Status: DC | PRN

## 2020-06-16 MED ORDER — LIDOCAINE HCL (PF) 1 % IJ SOLN: 5.0000 mL | INTRAMUSCULAR | Status: DC | PRN

## 2020-06-16 MED ORDER — SODIUM CHLORIDE 0.9 % IV SOLN: 100.0000 mL | INTRAVENOUS | Status: DC | PRN

## 2020-06-16 MED ORDER — PENTAFLUOROPROP-TETRAFLUOROETH EX AERO: 1.0000 "application " | INHALATION_SPRAY | CUTANEOUS | Status: DC | PRN

## 2020-06-16 MED ORDER — HYDROCODONE-ACETAMINOPHEN 5-325 MG PO TABS
1.0000 | ORAL_TABLET | Freq: Four times a day (QID) | ORAL | 0 refills | Status: DC | PRN
Start: 2020-06-16 — End: 2020-07-13

## 2020-06-16 NOTE — Progress Notes (Signed)
Informed by RN Levada Dy from dialysis that dialysis will start for patient around 1400 today due to equipment malfunction.  The patient stated she will stay inpatient and wait for dialysis completion at 1400 and then be discharged.

## 2020-06-16 NOTE — Discharge Instructions (Signed)
Surgical Drain Home Care Surgical drains are used to remove extra fluid that normally builds up in a surgical wound after surgery. A surgical drain helps to heal a surgical wound. Different kinds of surgical drains include:  Active drains. These drains use suction to pull drainage away from the surgical wound. Drainage flows through a tube to a container outside of the body. With these drains, you need to keep the bulb or the drainage container flat (compressed) at all times, except while you empty it. Flattening the bulb or container creates suction.  Passive drains. These drains allow fluid to drain naturally, by gravity. Drainage flows through a tube to a bandage (dressing) or a container outside of the body. Passive drains do not need to be emptied. A drain is placed during surgery. Right after surgery, drainage is usually bright red and a little thicker than water. The drainage may gradually turn yellow or pink and become thinner. It is likely that your health care provider will remove the drain when the drainage stops or when the amount decreases to 1-2 Tbsp (15-30 mL) during a 24-hour period. Supplies needed:  Tape.  Germ-free cleaning solution (sterile saline).  Cotton swabs.  Split gauze drain sponge: 4 x 4 inches (10 x 10 cm).  Gauze square: 4 x 4 inches (10 x 10 cm). How to care for your surgical drain Care for your drain as told by your health care provider. This is important to help prevent infection. If your drain is placed at your back, or any other hard-to-reach area, ask another person to assist you in performing the following tasks: General care  Keep the skin around the drain dry and covered with a dressing at all times.  Check your drain area every day for signs of infection. Check for: ? Redness, swelling, or pain. ? Pus or a bad smell. ? Cloudy drainage. ? Tenderness or pressure at the drain exit site. Changing the dressing Follow instructions from your health care  provider about how to change your dressing. Change your dressing at least once a day. Change it more often if needed to keep the dressing dry. Make sure you: 1. Gather your supplies. 2. Wash your hands with soap and water before you change your dressing. If soap and water are not available, use hand sanitizer. 3. Remove the old dressing. Avoid using scissors to do that. 4. Wash your hands with soap and water again after removing the old dressing. 5. Use sterile saline to clean your skin around the drain. You may need to use a cotton swab to clean the skin. 6. Place the tube through the slit in a drain sponge. Place the drain sponge so that it covers your wound. 7. Place the gauze square or another drain sponge on top of the drain sponge that is on the wound. Make sure the tube is between those layers. 8. Tape the dressing to your skin. 9. Tape the drainage tube to your skin 1-2 inches (2.5-5 cm) below the place where the tube enters your body. Taping keeps the tube from pulling on any stitches (sutures) that you have. 10. Wash your hands with soap and water. 11. Write down the color of your drainage and how often you change your dressing. How to empty your active drain  1. Make sure that you have a measuring cup that you can empty your drainage into. 2. Wash your hands with soap and water. If soap and water are not available, use hand sanitizer. 3.   Loosen any pins or clips that hold the tube in place. 4. If your health care provider tells you to strip the tube to prevent clots and tube blockages: ? Hold the tube at the skin with one hand. Use your other hand to pinch the tubing with your thumb and first finger. ? Gently move your fingers down the tube while squeezing very lightly. This clears any drainage, clots, or tissue from the tube. ? You may need to do this several times each day to keep the tube clear. Do not pull on the tube. 5. Open the bulb cap or the drain plug. Do not touch the  inside of the cap or the bottom of the plug. 6. Turn the device upside down and gently squeeze. 7. Empty all of the drainage into the measuring cup. 8. Compress the bulb or the container and replace the cap or the plug. To compress the bulb or the container, squeeze it firmly in the middle while you close the cap or plug the container. 9. Write down the amount of drainage that you have in each 24-hour period. If you have less than 2 Tbsp (30 mL) of drainage during 24 hours, contact your health care provider. 10. Flush the drainage down the toilet. 11. Wash your hands with soap and water. Contact a health care provider if:  You have redness, swelling, or pain around your drain area.  You have pus or a bad smell coming from your drain area.  You have a fever or chills.  The skin around your drain is warm to the touch.  The amount of drainage that you have is increasing instead of decreasing.  You have drainage that is cloudy.  There is a sudden stop or a sudden decrease in the amount of drainage that you have.  Your drain tube falls out.  Your active drain does not stay compressed after you empty it. Summary  Surgical drains are used to remove extra fluid that normally builds up in a surgical wound after surgery.  Different kinds of surgical drains include active drains and passive drains. Active drains use suction to pull drainage away from the surgical wound, and passive drains allow fluid to drain naturally.  It is important to care for your drain to prevent infection. If your drain is placed at your back, or any other hard-to-reach area, ask another person to assist you.  Contact your health care provider if you have redness, swelling, or pain around your drain area. This information is not intended to replace advice given to you by your health care provider. Make sure you discuss any questions you have with your health care provider. Document Revised: 09/04/2018 Document  Reviewed: 09/04/2018 Elsevier Patient Education  2020 [employer]  

## 2020-06-16 NOTE — Discharge Summary (Signed)
Physician Discharge Summary  Patient ID: Megan [name] MRN: [id number] DOB/AGE: [id number] y.o.  Admit date: 06/15/2020 Discharge date: 06/16/2020  Admission Diagnoses: Right breast carcinoma, right mastectomy mass  Discharge Diagnoses: Same Active Problems:   S/P left mastectomy End-stage renal disease, hypertension  Discharged Condition: good  Hospital Course: Patient is a 59 year old black female who recently underwent a right modified radical mastectomy for breast cancer that was recurrent in nature who now presents with a small nodule in the superior flap of the right mastectomy site and the desire to have a prophylactic left mastectomy.  She underwent a left simple mastectomy, right breast biopsy on 06/15/2020.  She tolerated the procedures well.  Her postoperative course has been unremarkable.  Her diet was advanced difficulty.  The patient is being dialyzed this morning and will be discharged after dialysis.  Final pathology is pending.  Treatments: surgery: Left simple mastectomy, right breast biopsy on 06/15/2020  Discharge Exam: Blood pressure (!) 156/98, pulse 83, temperature 98.5 F (36.9 C), temperature source Oral, resp. rate 19, height 5\' 2"  (1.575 m), weight 78.5 kg, SpO2 (!) 89 %. General appearance: alert, cooperative and no distress Resp: clear to auscultation bilaterally Breasts: Left mastectomy healing well without hematoma or drainage.  Serosanguineous JP drainage noted. Cardio: regular rate and rhythm, S1, S2 normal, no murmur, click, rub or gallop  Disposition: Discharge disposition: 01-Home or Self Care       Discharge Instructions    Diet - low sodium heart healthy   Complete by: As directed    Increase activity slowly   Complete by: As directed      Allergies as of 06/16/2020   No Active Allergies     Medication List    STOP taking these medications   ARANESP (ALBUMIN FREE) IJ     TAKE these medications   amLODipine 10 MG tablet  Commonly known as: NORVASC Take 1 tablet (10 mg total) by mouth at bedtime. What changed: when to take this   anastrozole 1 MG tablet Commonly known as: ARIMIDEX TAKE 1 TABLET(1 MG) BY MOUTH DAILY What changed: See the new instructions.   aspirin EC 81 MG tablet Take 81 mg by mouth daily.   atorvastatin 20 MG tablet Commonly known as: LIPITOR TAKE 1 TABLET DAILY What changed: when to take this   benzonatate 200 MG capsule Commonly known as: TESSALON Take 1 capsule (200 mg total) by mouth 2 (two) times daily as needed for cough.   cinacalcet 30 MG tablet Commonly known as: SENSIPAR Take 2 tablets (60 mg total) by mouth daily. What changed:   how much to take  when to take this   Dialyvite 800-Zinc 15 0.8 MG Tabs Take 1 tablet by mouth daily.   DULoxetine 30 MG capsule Commonly known as: CYMBALTA TAKE 1 CAPSULE(30 MG) BY MOUTH DAILY What changed: See the new instructions.   Fosrenol 1000 MG chewable tablet Generic drug: lanthanum Chew 2,000-3,000 mg by mouth See admin instructions. Take 3000 mg by mouth with meals and 2000 mg with snacks   gabapentin 300 MG capsule Commonly known as: NEURONTIN TAKE 1 CAPSULE(300 MG) BY MOUTH AT BEDTIME What changed: See the new instructions.   HYDROcodone-acetaminophen 5-325 MG tablet Commonly known as: Norco Take 1 tablet by mouth every 6 (six) hours as needed for moderate pain.   lidocaine-prilocaine cream Commonly known as: EMLA Apply 1 application topically every Monday, Wednesday, and Friday with hemodialysis.   nebivolol 10 MG tablet Commonly known as:  Bystolic Take 1 tablet (10 mg total) by mouth in the morning and at bedtime.   pantoprazole 40 MG tablet Commonly known as: PROTONIX TAKE 1 TABLET(40 MG) BY MOUTH DAILY What changed:   how much to take  how to take this  when to take this  additional instructions       Follow-up Information    Aviva Signs, MD. Schedule an appointment as soon as possible  for a visit on 06/22/2020.   Specialty: General Surgery Contact information: [id number] [id number] [phone]               Signed: Aviva Signs 06/16/2020, 9:51 AM

## 2020-06-16 NOTE — Care Management Obs Status (Signed)
[facility] NOTIFICATION   Patient Details  Name: Megan [name] MRN: [id number] Date of Birth: 01-19-1961   Medicare Observation Status Notification Given:  Yes    Tommy Medal 06/16/2020, 10:41 AM

## 2020-06-16 NOTE — Progress Notes (Signed)
  [facility] Progress Note   Assessment/ Plan:    Dialyzes at [facility]- MWF 3.5 hours  EDW 78.5. HD [place] 2/2.5, Dialyzer 180, 400 BFR- Heparin yes. Access left AVF-  15 guage.,  darbe 80 mcg weekly, venofer 50 weekly, calcitriol 1 mcg tiw  Assessment/Plan: 59 year old BF- ESRD-  Presents for elective left mastectomy 1 history of breast CA with recurrence-  now s/p prophylactic mastectomy on left and R breast biopsy 2 ESRD: normally MWF-  [place] -  HD today, no heparin 3 Hypertension: BP and volume status OK-  To EDW with HD yesterday.  Continue home norvasc and bystolic 4. Anemia of ESRD: pre op hgb 10.5-  No meds this hosp--> consider holding ESA as OP in the setting of active breast ca 5. Metabolic Bone Disease: to continue with home calcitriol, sensipar and fosrenol  6.  Dispo: d/c today after HD  Subjective:    S/p L mastectomy yesterday with Dr Arnoldo Morale.  Doing well, sitting up in bed.  For HD today and then d/c.     Objective:   BP (!) 156/98 (BP Location: Right Leg)   Pulse 83   Temp 98.5 F (36.9 C) (Oral)   Resp 19   Ht 5\' 2"  (1.575 m)   Wt 78.5 kg   SpO2 (!) 89%   BMI 31.64 kg/m   Physical Exam: Gen: NAD, sitting up in bed, husband beside her CVS: RRR CHEST: L dressings intact Resp: clear Abd: soft, nontender NABS Ext: no LE edema ACCESS: L forearm AVF + T/B  Labs: BMET Recent Labs  Lab 06/15/20 0652 06/16/20 0429  NA 138 137  K 3.6 3.6  CL 100 100  CO2  --  23  GLUCOSE 110* 100*  BUN 45* 61*  CREATININE 8.90* 10.21*  CALCIUM  --  8.9  PHOS  --  5.0*   CBC Recent Labs  Lab 06/15/20 0652 06/16/20 0429  WBC  --  13.3*  HGB 10.5* 9.0*  HCT 31.0* 29.5*  MCV  --  98.0  PLT  --  335      Medications:    . amLODipine  10 mg Oral QHS  . aspirin EC  81 mg Oral Daily  . atorvastatin  20 mg Oral QHS  . calcitRIOL  1 mcg Oral Q M,W,F-HD  . Chlorhexidine Gluconate Cloth  6 each Topical Q0600  . cinacalcet  30 mg Oral Q supper   . DULoxetine  30 mg Oral Daily  . gabapentin  300 mg Oral QHS  . heparin  5,000 Units Subcutaneous Q8H  . lanthanum  3,000 mg Oral TID with meals  . multivitamin  1 tablet Oral Daily  . nebivolol  10 mg Oral Daily  . pantoprazole  40 mg Oral Daily     Madelon Lips, MD 06/16/2020, 9:32 AM

## 2020-06-16 NOTE — Progress Notes (Signed)
AVS reviewed with patient and spouse. All new medications and follow up appointments discussed with patient. Wound care education provided to patient. Patient voiced understanding and gave read back affirmation.  Wound care supplies given to patient. IV access removed. Dressing applied. JP drainage to remain intact until follow up appointment per MD Arnoldo Morale.  Patient belongings with spouse. Patient to discharge with spouse  Via private vehicle.

## 2020-06-18 DIAGNOSIS — N2581 Secondary hyperparathyroidism of renal origin: Secondary | ICD-10-CM | POA: Diagnosis not present

## 2020-06-18 DIAGNOSIS — Z992 Dependence on renal dialysis: Secondary | ICD-10-CM | POA: Diagnosis not present

## 2020-06-18 DIAGNOSIS — D631 Anemia in chronic kidney disease: Secondary | ICD-10-CM | POA: Diagnosis not present

## 2020-06-18 DIAGNOSIS — N186 End stage renal disease: Secondary | ICD-10-CM | POA: Diagnosis not present

## 2020-06-18 DIAGNOSIS — E1129 Type 2 diabetes mellitus with other diabetic kidney complication: Secondary | ICD-10-CM | POA: Diagnosis not present

## 2020-06-18 DIAGNOSIS — D509 Iron deficiency anemia, unspecified: Secondary | ICD-10-CM | POA: Diagnosis not present

## 2020-06-21 DIAGNOSIS — D631 Anemia in chronic kidney disease: Secondary | ICD-10-CM | POA: Diagnosis not present

## 2020-06-21 DIAGNOSIS — Z992 Dependence on renal dialysis: Secondary | ICD-10-CM | POA: Diagnosis not present

## 2020-06-21 DIAGNOSIS — E1129 Type 2 diabetes mellitus with other diabetic kidney complication: Secondary | ICD-10-CM | POA: Diagnosis not present

## 2020-06-21 DIAGNOSIS — N186 End stage renal disease: Secondary | ICD-10-CM | POA: Diagnosis not present

## 2020-06-21 DIAGNOSIS — D509 Iron deficiency anemia, unspecified: Secondary | ICD-10-CM | POA: Diagnosis not present

## 2020-06-21 DIAGNOSIS — N2581 Secondary hyperparathyroidism of renal origin: Secondary | ICD-10-CM | POA: Diagnosis not present

## 2020-06-22 ENCOUNTER — Other Ambulatory Visit: Payer: Self-pay

## 2020-06-22 ENCOUNTER — Encounter: Payer: Self-pay | Admitting: General Surgery

## 2020-06-22 ENCOUNTER — Ambulatory Visit (INDEPENDENT_AMBULATORY_CARE_PROVIDER_SITE_OTHER): Payer: Medicare Other | Admitting: General Surgery

## 2020-06-22 VITALS — BP 135/80 | HR 85 | Temp 98.6°F | Resp 16 | Ht 62.0 in | Wt 169.0 lb

## 2020-06-22 DIAGNOSIS — Z09 Encounter for follow-up examination after completed treatment for conditions other than malignant neoplasm: Secondary | ICD-10-CM

## 2020-06-23 DIAGNOSIS — E1129 Type 2 diabetes mellitus with other diabetic kidney complication: Secondary | ICD-10-CM | POA: Diagnosis not present

## 2020-06-23 DIAGNOSIS — Z992 Dependence on renal dialysis: Secondary | ICD-10-CM | POA: Diagnosis not present

## 2020-06-23 DIAGNOSIS — N186 End stage renal disease: Secondary | ICD-10-CM | POA: Diagnosis not present

## 2020-06-23 DIAGNOSIS — N2581 Secondary hyperparathyroidism of renal origin: Secondary | ICD-10-CM | POA: Diagnosis not present

## 2020-06-23 DIAGNOSIS — D509 Iron deficiency anemia, unspecified: Secondary | ICD-10-CM | POA: Diagnosis not present

## 2020-06-23 DIAGNOSIS — D631 Anemia in chronic kidney disease: Secondary | ICD-10-CM | POA: Diagnosis not present

## 2020-06-23 NOTE — Progress Notes (Signed)
Subjective:     Megan [name]  Here for postoperative visit, status post right breast biopsy and prophylactic left simple mastectomy.  She is doing well and has no complaints of fever or pain. Objective:    BP 135/80   Pulse 85   Temp 98.6 F (37 C) (Oral)   Resp 16   Ht 5\' 2"  (1.575 m)   Wt 169 lb (76.7 kg)   SpO2 93%   BMI 30.91 kg/m   General:  alert, cooperative and no distress  Left mastectomy healing well.  JP drain removed.  Minimal ecchymosis or hematoma present.  Right mastectomy biopsy site healing well. 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  Left breast without malignancy.     Assessment:    Doing well postoperatively. Patient does have a biopsy positive specimen in the right mastectomy site close to the chest wall.    Plan:   I told the patient that I need to do a further excision to make sure the margins were clear.  Will refer her back to Dr. Delton Coombes of oncology for possible radiation therapy to the right mastectomy site.  Return in 1 week for left mastectomy wound check and scheduling of right partial mastectomy.

## 2020-06-25 DIAGNOSIS — E1129 Type 2 diabetes mellitus with other diabetic kidney complication: Secondary | ICD-10-CM | POA: Diagnosis not present

## 2020-06-25 DIAGNOSIS — Z992 Dependence on renal dialysis: Secondary | ICD-10-CM | POA: Diagnosis not present

## 2020-06-25 DIAGNOSIS — D509 Iron deficiency anemia, unspecified: Secondary | ICD-10-CM | POA: Diagnosis not present

## 2020-06-25 DIAGNOSIS — N186 End stage renal disease: Secondary | ICD-10-CM | POA: Diagnosis not present

## 2020-06-25 DIAGNOSIS — N2581 Secondary hyperparathyroidism of renal origin: Secondary | ICD-10-CM | POA: Diagnosis not present

## 2020-06-25 DIAGNOSIS — D631 Anemia in chronic kidney disease: Secondary | ICD-10-CM | POA: Diagnosis not present

## 2020-06-28 DIAGNOSIS — D631 Anemia in chronic kidney disease: Secondary | ICD-10-CM | POA: Diagnosis not present

## 2020-06-28 DIAGNOSIS — Z992 Dependence on renal dialysis: Secondary | ICD-10-CM | POA: Diagnosis not present

## 2020-06-28 DIAGNOSIS — N2581 Secondary hyperparathyroidism of renal origin: Secondary | ICD-10-CM | POA: Diagnosis not present

## 2020-06-28 DIAGNOSIS — D509 Iron deficiency anemia, unspecified: Secondary | ICD-10-CM | POA: Diagnosis not present

## 2020-06-28 DIAGNOSIS — E1129 Type 2 diabetes mellitus with other diabetic kidney complication: Secondary | ICD-10-CM | POA: Diagnosis not present

## 2020-06-28 DIAGNOSIS — N186 End stage renal disease: Secondary | ICD-10-CM | POA: Diagnosis not present

## 2020-06-29 ENCOUNTER — Encounter: Payer: Self-pay | Admitting: General Surgery

## 2020-06-29 ENCOUNTER — Other Ambulatory Visit: Payer: Self-pay

## 2020-06-29 ENCOUNTER — Ambulatory Visit (INDEPENDENT_AMBULATORY_CARE_PROVIDER_SITE_OTHER): Payer: Medicare Other | Admitting: General Surgery

## 2020-06-29 VITALS — BP 146/80 | HR 78 | Temp 98.1°F | Resp 14 | Ht 62.0 in | Wt 168.0 lb

## 2020-06-29 DIAGNOSIS — Z09 Encounter for follow-up examination after completed treatment for conditions other than malignant neoplasm: Secondary | ICD-10-CM

## 2020-06-29 MED ORDER — ATORVASTATIN CALCIUM 20 MG PO TABS: 20.0000 mg | ORAL_TABLET | Freq: Every day | ORAL | 3 refills | Status: DC

## 2020-06-29 NOTE — Progress Notes (Signed)
Subjective:     Megan [name]  Here for postoperative visit.  Patient has no complaints. Objective:    BP (!) 146/80   Pulse 78   Temp 98.1 F (36.7 C) (Oral)   Resp 14   Ht 5\' 2"  (1.575 m)   Wt 168 lb (76.2 kg)   SpO2 98%   BMI 30.73 kg/m   General:  alert, cooperative and no distress  Left mastectomy incision healing well.  Remaining staples removed, Steri-Strips applied.     Assessment:    Doing well postoperatively.    Plan:   Have scheduled patient for right partial mastectomy due to positive biopsy on 07/13/2020.

## 2020-06-30 DIAGNOSIS — E1129 Type 2 diabetes mellitus with other diabetic kidney complication: Secondary | ICD-10-CM | POA: Diagnosis not present

## 2020-06-30 DIAGNOSIS — D631 Anemia in chronic kidney disease: Secondary | ICD-10-CM | POA: Diagnosis not present

## 2020-06-30 DIAGNOSIS — N186 End stage renal disease: Secondary | ICD-10-CM | POA: Diagnosis not present

## 2020-06-30 DIAGNOSIS — N2581 Secondary hyperparathyroidism of renal origin: Secondary | ICD-10-CM | POA: Diagnosis not present

## 2020-06-30 DIAGNOSIS — Z992 Dependence on renal dialysis: Secondary | ICD-10-CM | POA: Diagnosis not present

## 2020-06-30 DIAGNOSIS — D509 Iron deficiency anemia, unspecified: Secondary | ICD-10-CM | POA: Diagnosis not present

## 2020-07-01 NOTE — H&P (Signed)
Megan [name] is an 59 y.o. female.   Chief Complaint: Right breast carcinoma HPI: Patient is a 59 year old black female status post right breast biopsy for a new mass in the right mastectomy site which was positive for malignancy.  She now presents for wide excision of the area.     Past Medical History:  Diagnosis Date  . (HFpEF) heart failure with preserved ejection fraction ([place])    a. 01/2019 Echo: EF 55-60%, mild conc LVH. DD.  Torn MV chordae.  . Anemia   . Atypical chest pain    a. 08/2018 MV: EF 59%, no ischemia; b. 02/2019 Cath: nonobs dzs.  . Blood transfusion without reported diagnosis   . Cataract   . ESRD (end stage renal disease) on dialysis ([place])    a. HD T, T, S  . Essential hypertension, benign   . GERD (gastroesophageal reflux disease)   . Headache   . Hemorrhoids   . Mixed hyperlipidemia   . Morbid obesity ([place])   . Non-obstructive CAD (coronary artery disease)    a. 02/2019 CathL LM nl, LAD 49m, LCX nl, RCA 25p, EF 55-65%.  Marland Kitchen PONV (postoperative nausea and vomiting)   . S/P colonoscopy Jan 2011   Dr. Benson Norway: sessile polyp (benign lymphoid), large hemorrhoids, repeat 5-10 years  . Temporal arteritis ([place])   . Type 2 diabetes mellitus ([place])   . Wears glasses          Past Surgical History:  Procedure Laterality Date  . ABDOMINAL HYSTERECTOMY    . APPENDECTOMY    . ARTERY BIOPSY N/A 05/09/2018   Procedure: RIGHT TEMPORAL ARTERY BIOPSY;  Surgeon: Judeth Horn, MD;  Location: [place] [facility];  Service: General;  Laterality: N/A;  . CATARACT EXTRACTION W/PHACO Left 02/09/2017   Procedure: CATARACT EXTRACTION PHACO AND INTRAOCULAR LENS PLACEMENT LEFT EYE;  Surgeon: Tonny Branch, MD;  Location: [facility];  Service: Ophthalmology;  Laterality: Left;  CDE: 4.89  . CATARACT EXTRACTION W/PHACO Right 06/04/2017   Procedure: CATARACT EXTRACTION PHACO AND INTRAOCULAR LENS PLACEMENT (IOC);  Surgeon: Tonny Branch, MD;  Location: [facility];  Service:  Ophthalmology;  Laterality: Right;  CDE: 4.12  . CHOLECYSTECTOMY  09/29/2011   Procedure: LAPAROSCOPIC CHOLECYSTECTOMY;  Surgeon: Jamesetta So, MD;  Location: [facility];  Service: General;  Laterality: N/A;  . COLONOSCOPY  Jan 2011   Dr. Benson Norway: sessile polyp (benign lymphoid), large hemorrhoids, repeat 5-10 years  . COLONOSCOPY N/A 06/12/2016   Procedure: COLONOSCOPY;  Surgeon: Daneil Dolin, MD;  Location: [facility];  Service: Endoscopy;  Laterality: N/A;  1230   . ESOPHAGOGASTRODUODENOSCOPY  09/05/2011   OHY:WVPXT hiatal hernia; remainder of exam normal. No explanation for patient's abdominal pain with today's examination  . ESOPHAGOGASTRODUODENOSCOPY N/A 12/17/2013   Dr. [name]: gastric erythema, erosion, mild chronic inflammation on path   . LAPAROSCOPIC APPENDECTOMY  09/29/2011   Procedure: APPENDECTOMY LAPAROSCOPIC;  Surgeon: Jamesetta So, MD;  Location: [facility];  Service: General;;  incidental appendectomy  . LEFT HEART CATH AND CORONARY ANGIOGRAPHY N/A 02/28/2019   Procedure: LEFT HEART CATH AND CORONARY ANGIOGRAPHY;  Surgeon: Jettie Booze, MD;  Location: [facility];  Service: Cardiovascular;  Laterality: N/A;  . MASTECTOMY MODIFIED RADICAL Right 02/18/2020   Procedure: MASTECTOMY MODIFIED RADICAL;  Surgeon: Aviva Signs, MD;  Location: [facility];  Service: General;  Laterality: Right;  . PARTIAL MASTECTOMY WITH NEEDLE LOCALIZATION AND AXILLARY SENTINEL LYMPH NODE BX Right 09/18/2018   Procedure: RIGHT PARTIAL MASTECTOMY AFTER NEEDLE  LOCALIZATION, SENTINEL LYMPH NODE BIOPSY RIGHT AXILLA;  Surgeon: Aviva Signs, MD;  Location: [facility];  Service: General;  Laterality: Right;         Family History  Problem Relation Age of Onset  . Hypertension Mother   . Coronary artery disease Mother   . Diabetes Mother   . Hypertension Sister   . Coronary artery disease Sister   . Hypertension Brother   . Heart attack Father   . Hypertension Son   . Heart attack  Maternal Aunt   . Hypertension Maternal Aunt   . Diabetes Maternal Aunt   . Heart attack Maternal Uncle   . Hypertension Maternal Uncle   . Diabetes Maternal Uncle   . Heart attack Paternal Aunt   . Hypertension Paternal Aunt   . Diabetes Paternal Aunt   . Heart attack Paternal Uncle   . Hypertension Paternal Uncle   . Diabetes Paternal Uncle   . Heart attack Maternal Grandmother   . Heart attack Maternal Grandfather   . Heart attack Paternal Grandmother   . Heart attack Paternal Grandfather   . Colon cancer Neg Hx    Social History:  reports that she has never smoked. She has never used smokeless tobacco. She reports that she does not drink alcohol and does not use drugs.  Allergies:      Allergies  Allergen Reactions  . Hydrocodone Itching    No medications prior to admission.    Lab Results Last 48 Hours  No results found for this or any previous visit (from the past 48 hour(s)).   Imaging Results (Last 48 hours)  No results found.    Review of Systems  Constitutional: Positive for fatigue.  HENT: Negative.   Eyes: Negative.   Respiratory: Negative.   Cardiovascular: Negative.   Gastrointestinal: Negative.   Endocrine: Negative.   Musculoskeletal: Negative.   Allergic/Immunologic: Negative.   Neurological: Negative.   Hematological: Negative.   Psychiatric/Behavioral: Negative.     There were no vitals taken for this visit. Physical Exam Vitals reviewed.  Constitutional:      Appearance: Normal appearance. She is not ill-appearing.  HENT:     Head: Normocephalic and atraumatic.  Cardiovascular:     Rate and Rhythm: Normal rate and regular rhythm.     Heart sounds: Normal heart sounds. No murmur heard.  No friction rub. No gallop.   Pulmonary:     Effort: Pulmonary effort is normal. No respiratory distress.     Breath sounds: Normal breath sounds. No stridor. No wheezing, rhonchi or rales.  Skin:    General: Skin is warm  and dry.  Neurological:     Mental Status: She is alert and oriented to person, place, and time.   Breast: Well-healed right modified radical surgical scar.  No dominant mass, nipple discharge, dimpling in the left breast.  Assessment/Plan Impression: Right breast carcinoma, desire for prophylactic left mastectomy Newly diagnosed metastatic nodule in the right mastectomy site Plan: Patient is scheduled for right partial mastectomy on 07/13/2020. The risks and benefits of the procedure including bleeding, infection, and cardiopulmonary difficulties were fully explained to the patient, who gave informed consent. Aviva Signs, MD Addendum:

## 2020-07-02 DIAGNOSIS — E1129 Type 2 diabetes mellitus with other diabetic kidney complication: Secondary | ICD-10-CM | POA: Diagnosis not present

## 2020-07-02 DIAGNOSIS — N2581 Secondary hyperparathyroidism of renal origin: Secondary | ICD-10-CM | POA: Diagnosis not present

## 2020-07-02 DIAGNOSIS — N186 End stage renal disease: Secondary | ICD-10-CM | POA: Diagnosis not present

## 2020-07-02 DIAGNOSIS — D631 Anemia in chronic kidney disease: Secondary | ICD-10-CM | POA: Diagnosis not present

## 2020-07-02 DIAGNOSIS — Z992 Dependence on renal dialysis: Secondary | ICD-10-CM | POA: Diagnosis not present

## 2020-07-02 DIAGNOSIS — D509 Iron deficiency anemia, unspecified: Secondary | ICD-10-CM | POA: Diagnosis not present

## 2020-07-03 ENCOUNTER — Emergency Department (HOSPITAL_COMMUNITY): Payer: Medicare Other

## 2020-07-03 ENCOUNTER — Encounter (HOSPITAL_COMMUNITY): Payer: Self-pay

## 2020-07-03 ENCOUNTER — Observation Stay (HOSPITAL_COMMUNITY)
Admission: EM | Admit: 2020-07-03 | Discharge: 2020-07-04 | Disposition: A | Discharge status: Home / Self Care | Payer: Medicare Other | Attending: Internal Medicine | Admitting: Internal Medicine

## 2020-07-03 ENCOUNTER — Other Ambulatory Visit: Payer: Self-pay

## 2020-07-03 DIAGNOSIS — E1122 Type 2 diabetes mellitus with diabetic chronic kidney disease: Secondary | ICD-10-CM | POA: Diagnosis not present

## 2020-07-03 DIAGNOSIS — Z20822 Contact with and (suspected) exposure to covid-19: Secondary | ICD-10-CM | POA: Diagnosis not present

## 2020-07-03 DIAGNOSIS — I132 Hypertensive heart and chronic kidney disease with heart failure and with stage 5 chronic kidney disease, or end stage renal disease: Secondary | ICD-10-CM | POA: Diagnosis not present

## 2020-07-03 DIAGNOSIS — I5031 Acute diastolic (congestive) heart failure: Secondary | ICD-10-CM | POA: Diagnosis not present

## 2020-07-03 DIAGNOSIS — I2510 Atherosclerotic heart disease of native coronary artery without angina pectoris: Secondary | ICD-10-CM | POA: Diagnosis not present

## 2020-07-03 DIAGNOSIS — J9601 Acute respiratory failure with hypoxia: Secondary | ICD-10-CM | POA: Diagnosis not present

## 2020-07-03 DIAGNOSIS — I509 Heart failure, unspecified: Secondary | ICD-10-CM

## 2020-07-03 DIAGNOSIS — Z79899 Other long term (current) drug therapy: Secondary | ICD-10-CM | POA: Diagnosis not present

## 2020-07-03 DIAGNOSIS — I10 Essential (primary) hypertension: Secondary | ICD-10-CM | POA: Diagnosis present

## 2020-07-03 DIAGNOSIS — J90 Pleural effusion, not elsewhere classified: Secondary | ICD-10-CM | POA: Diagnosis not present

## 2020-07-03 DIAGNOSIS — N186 End stage renal disease: Secondary | ICD-10-CM | POA: Diagnosis not present

## 2020-07-03 DIAGNOSIS — J9811 Atelectasis: Secondary | ICD-10-CM | POA: Diagnosis not present

## 2020-07-03 DIAGNOSIS — E039 Hypothyroidism, unspecified: Secondary | ICD-10-CM | POA: Insufficient documentation

## 2020-07-03 DIAGNOSIS — Z7982 Long term (current) use of aspirin: Secondary | ICD-10-CM | POA: Diagnosis not present

## 2020-07-03 DIAGNOSIS — R0602 Shortness of breath: Secondary | ICD-10-CM | POA: Diagnosis not present

## 2020-07-03 DIAGNOSIS — J181 Lobar pneumonia, unspecified organism: Secondary | ICD-10-CM

## 2020-07-03 DIAGNOSIS — N200 Calculus of kidney: Secondary | ICD-10-CM | POA: Diagnosis not present

## 2020-07-03 DIAGNOSIS — C50211 Malignant neoplasm of upper-inner quadrant of right female breast: Secondary | ICD-10-CM | POA: Diagnosis present

## 2020-07-03 DIAGNOSIS — N281 Cyst of kidney, acquired: Secondary | ICD-10-CM

## 2020-07-03 DIAGNOSIS — I110 Hypertensive heart disease with heart failure: Secondary | ICD-10-CM | POA: Diagnosis not present

## 2020-07-03 DIAGNOSIS — T8249XA Other complication of vascular dialysis catheter, initial encounter: Secondary | ICD-10-CM

## 2020-07-03 DIAGNOSIS — Z992 Dependence on renal dialysis: Secondary | ICD-10-CM | POA: Insufficient documentation

## 2020-07-03 DIAGNOSIS — R0902 Hypoxemia: Secondary | ICD-10-CM

## 2020-07-03 LAB — COMPREHENSIVE METABOLIC PANEL
ALT: 18 U/L (ref 0–44)
AST: 20 U/L (ref 15–41)
Albumin: 3.9 g/dL (ref 3.5–5.0)
Alkaline Phosphatase: 103 U/L (ref 38–126)
Anion gap: 10 (ref 5–15)
BUN: 23 mg/dL — ABNORMAL HIGH (ref 6–20)
CO2: 22 mmol/L (ref 22–32)
Calcium: 10 mg/dL (ref 8.9–10.3)
Chloride: 106 mmol/L (ref 98–111)
Creatinine, Ser: 6.1 mg/dL — ABNORMAL HIGH (ref 0.44–1.00)
GFR, Estimated: 7 mL/min — ABNORMAL LOW (ref >60)
Glucose, Bld: 122 mg/dL — ABNORMAL HIGH (ref 70–99)
Potassium: 3.4 mmol/L — ABNORMAL LOW (ref 3.5–5.1)
Sodium: 138 mmol/L (ref 135–145)
Total Bilirubin: 0.6 mg/dL (ref 0.3–1.2)
Total Protein: 8.1 g/dL (ref 6.5–8.1)

## 2020-07-03 LAB — CBC WITH DIFFERENTIAL/PLATELET
Abs Immature Granulocytes: 0.06 10*3/uL (ref 0.00–0.07)
Basophils Absolute: 0.1 10*3/uL (ref 0.0–0.1)
Basophils Relative: 1 %
Eosinophils Absolute: 0.3 10*3/uL (ref 0.0–0.5)
Eosinophils Relative: 2 %
HCT: 33.7 % — ABNORMAL LOW (ref 36.0–46.0)
Hemoglobin: 10.5 g/dL — ABNORMAL LOW (ref 12.0–15.0)
Immature Granulocytes: 0 %
Lymphocytes Relative: 6 %
Lymphs Abs: 0.9 10*3/uL (ref 0.7–4.0)
MCH: 30.7 pg (ref 26.0–34.0)
MCHC: 31.2 g/dL (ref 30.0–36.0)
MCV: 98.5 fL (ref 80.0–100.0)
Monocytes Absolute: 0.9 10*3/uL (ref 0.1–1.0)
Monocytes Relative: 6 %
Neutro Abs: 12.9 10*3/uL — ABNORMAL HIGH (ref 1.7–7.7)
Neutrophils Relative %: 85 %
Platelets: 368 10*3/uL (ref 150–400)
RBC: 3.42 MIL/uL — ABNORMAL LOW (ref 3.87–5.11)
RDW: 16.8 % — ABNORMAL HIGH (ref 11.5–15.5)
WBC: 15.2 10*3/uL — ABNORMAL HIGH (ref 4.0–10.5)
nRBC: 0 % (ref 0.0–0.2)

## 2020-07-03 LAB — D-DIMER, QUANTITATIVE: D-Dimer, Quant: 1.36 ug/mL-FEU — ABNORMAL HIGH (ref 0.00–0.50)

## 2020-07-03 LAB — RESP PANEL BY RT-PCR (FLU A&B, COVID) ARPGX2
Influenza A by PCR: NEGATIVE
Influenza B by PCR: NEGATIVE
SARS Coronavirus 2 by RT PCR: NEGATIVE

## 2020-07-03 LAB — BRAIN NATRIURETIC PEPTIDE: B Natriuretic Peptide: 2129 pg/mL — ABNORMAL HIGH (ref 0.0–100.0)

## 2020-07-03 MED ORDER — SODIUM CHLORIDE 0.9 % IV SOLN
1.0000 g | INTRAVENOUS | Status: DC
  Administered 2020-07-03 – 2020-07-04 (×2): 1 g via INTRAVENOUS
  Filled 2020-07-03 (×4): qty 1

## 2020-07-03 MED ORDER — DULOXETINE HCL 30 MG PO CPEP
30.0000 mg | ORAL_CAPSULE | Freq: Every day | ORAL | Status: DC
  Administered 2020-07-03 – 2020-07-04 (×2): 30 mg via ORAL
  Filled 2020-07-03 (×2): qty 1

## 2020-07-03 MED ORDER — PANTOPRAZOLE SODIUM 40 MG PO TBEC
40.0000 mg | DELAYED_RELEASE_TABLET | Freq: Two times a day (BID) | ORAL | Status: DC
  Administered 2020-07-03 – 2020-07-04 (×2): 40 mg via ORAL
  Filled 2020-07-03 (×2): qty 1

## 2020-07-03 MED ORDER — GABAPENTIN 300 MG PO CAPS
300.0000 mg | ORAL_CAPSULE | Freq: Every day | ORAL | Status: DC
  Administered 2020-07-03: 300 mg via ORAL
  Filled 2020-07-03: qty 1

## 2020-07-03 MED ORDER — ATORVASTATIN CALCIUM 20 MG PO TABS
20.0000 mg | ORAL_TABLET | Freq: Every day | ORAL | Status: DC
  Administered 2020-07-03: 20 mg via ORAL
  Filled 2020-07-03: qty 1

## 2020-07-03 MED ORDER — CHLORHEXIDINE GLUCONATE CLOTH 2 % EX PADS: 6.0000 | MEDICATED_PAD | Freq: Every day | CUTANEOUS | Status: DC

## 2020-07-03 MED ORDER — ONDANSETRON HCL 4 MG PO TABS: 4.0000 mg | ORAL_TABLET | Freq: Four times a day (QID) | ORAL | Status: DC | PRN

## 2020-07-03 MED ORDER — NEPRO/CARBSTEADY PO LIQD
237.0000 mL | Freq: Two times a day (BID) | ORAL | Status: DC
  Administered 2020-07-04: 237 mL via ORAL

## 2020-07-03 MED ORDER — ACETAMINOPHEN 325 MG PO TABS: 650.0000 mg | ORAL_TABLET | Freq: Four times a day (QID) | ORAL | Status: DC | PRN

## 2020-07-03 MED ORDER — ONDANSETRON HCL 4 MG/2ML IJ SOLN
INTRAMUSCULAR | Status: AC
  Administered 2020-07-03: 4 mg via INTRAVENOUS
  Filled 2020-07-03: qty 2

## 2020-07-03 MED ORDER — CINACALCET HCL 30 MG PO TABS
30.0000 mg | ORAL_TABLET | Freq: Every day | ORAL | Status: DC
  Administered 2020-07-03: 30 mg via ORAL
  Filled 2020-07-03: qty 1

## 2020-07-03 MED ORDER — NEBIVOLOL HCL 10 MG PO TABS: 10.0000 mg | ORAL_TABLET | Freq: Every day | ORAL | Status: DC

## 2020-07-03 MED ORDER — VANCOMYCIN HCL 1250 MG/250ML IV SOLN
1250.0000 mg | Freq: Once | INTRAVENOUS | Status: AC
  Administered 2020-07-03: 1250 mg via INTRAVENOUS
  Filled 2020-07-03: qty 250

## 2020-07-03 MED ORDER — LANTHANUM CARBONATE 500 MG PO CHEW: 2000.0000 mg | CHEWABLE_TABLET | ORAL | Status: DC

## 2020-07-03 MED ORDER — SODIUM CHLORIDE 0.9 % IV SOLN
1.0000 g | Freq: Once | INTRAVENOUS | Status: AC
  Administered 2020-07-03: 1 g via INTRAVENOUS
  Filled 2020-07-03: qty 10

## 2020-07-03 MED ORDER — ONDANSETRON HCL 4 MG/2ML IJ SOLN: 4.0000 mg | Freq: Four times a day (QID) | INTRAMUSCULAR | Status: DC | PRN

## 2020-07-03 MED ORDER — HYDRALAZINE HCL 20 MG/ML IJ SOLN: 10.0000 mg | Freq: Four times a day (QID) | INTRAMUSCULAR | Status: DC | PRN

## 2020-07-03 MED ORDER — AZITHROMYCIN 250 MG PO TABS
500.0000 mg | ORAL_TABLET | Freq: Once | ORAL | Status: AC
  Administered 2020-07-03: 500 mg via ORAL
  Filled 2020-07-03: qty 2

## 2020-07-03 MED ORDER — LANTHANUM CARBONATE 500 MG PO CHEW
3000.0000 mg | CHEWABLE_TABLET | Freq: Three times a day (TID) | ORAL | Status: DC
  Administered 2020-07-04: 3000 mg via ORAL
  Filled 2020-07-03 (×6): qty 6

## 2020-07-03 MED ORDER — VANCOMYCIN HCL IN DEXTROSE 750-5 MG/150ML-% IV SOLN
750.0000 mg | INTRAVENOUS | Status: DC
  Filled 2020-07-03: qty 150

## 2020-07-03 MED ORDER — NEBIVOLOL HCL 10 MG PO TABS
10.0000 mg | ORAL_TABLET | Freq: Two times a day (BID) | ORAL | Status: DC
  Administered 2020-07-03 – 2020-07-04 (×3): 10 mg via ORAL
  Filled 2020-07-03 (×3): qty 1

## 2020-07-03 MED ORDER — LANTHANUM CARBONATE 500 MG PO CHEW
2000.0000 mg | CHEWABLE_TABLET | ORAL | Status: DC
  Filled 2020-07-03 (×6): qty 4

## 2020-07-03 MED ORDER — DIALYVITE 800-ZINC 15 0.8 MG PO TABS: 1.0000 | ORAL_TABLET | Freq: Every day | ORAL | Status: DC

## 2020-07-03 MED ORDER — IOHEXOL 350 MG/ML SOLN
100.0000 mL | Freq: Once | INTRAVENOUS | Status: AC | PRN
  Administered 2020-07-03: 100 mL via INTRAVENOUS

## 2020-07-03 MED ORDER — ANASTROZOLE 1 MG PO TABS
1.0000 mg | ORAL_TABLET | Freq: Every day | ORAL | Status: DC
  Administered 2020-07-04: 1 mg via ORAL
  Filled 2020-07-03 (×3): qty 1

## 2020-07-03 MED ORDER — HEPARIN SODIUM (PORCINE) 5000 UNIT/ML IJ SOLN
5000.0000 [IU] | Freq: Three times a day (TID) | INTRAMUSCULAR | Status: DC
  Administered 2020-07-03 – 2020-07-04 (×3): 5000 [IU] via SUBCUTANEOUS
  Filled 2020-07-03 (×4): qty 1

## 2020-07-03 MED ORDER — RENA-VITE PO TABS
1.0000 | ORAL_TABLET | Freq: Every day | ORAL | Status: DC
  Administered 2020-07-03: 1 via ORAL
  Filled 2020-07-03: qty 1

## 2020-07-03 MED ORDER — PANTOPRAZOLE SODIUM 40 MG IV SOLR
40.0000 mg | Freq: Once | INTRAVENOUS | Status: AC
  Administered 2020-07-03: 40 mg via INTRAVENOUS
  Filled 2020-07-03: qty 40

## 2020-07-03 MED ORDER — ACETAMINOPHEN 650 MG RE SUPP: 650.0000 mg | Freq: Four times a day (QID) | RECTAL | Status: DC | PRN

## 2020-07-03 MED ORDER — AMLODIPINE BESYLATE 5 MG PO TABS
10.0000 mg | ORAL_TABLET | Freq: Every day | ORAL | Status: DC
  Administered 2020-07-03: 10 mg via ORAL
  Filled 2020-07-03: qty 2

## 2020-07-03 NOTE — H&P (Signed)
History and Physical  Megan [name] JEH:[id number] DOB: 1960/10/31 DOA: 07/03/2020   PCP: [name], MD   Patient coming from: Home  Chief Complaint: sob  HPI:  [name] is a 59 y.o. female with medical history of ESRD (MWF), right breast cancer, hypertension, hypothyroidism, iron deficiency, GERD, nonobstructive CAD, hyperlipidemia presenting with shortness of breath that began around 5 PM on 07/03/2019.  The patient states that she has been compliant with her dialysis sessions.  She has not missed any outpatient dialysis.  She states that she has been staying the whole time.  In addition, she states that she has been for the most part compliant with her fluid restriction and dietary intake.  Nevertheless, she states that the dialysis center has been trying to decrease her dry weight over the past 2 to 3 weeks.  She states that her shortness of breath and dyspnea on exertion lasted about 10 minutes.  However when she woke up around 12:30 AM on 07/03/2020 she states that she became short of breath again getting to the bathroom.  She denied any fevers, chills, chest pain, coughing, hemoptysis, nausea, vomiting, diarrhea, abdominal pain.  She states that she did receive the [place] booster vaccination for COVID-19 on 07/02/2020.  She has not received any new medications.  She denies any worsening lower extremity edema or increasing abdominal girth.  She does complain of nonproductive cough without hemoptysis In the emergency department, she was afebrile and hemodynamically stable with oxygen saturation 85% on room air.  She was placed on 2 L with saturations up to 95%.  Chest x-ray showed increased interstitial markings and R>L lower lobe opacity.  CTA chest was negative for PE but showed diffuse airway cuffing with interlobular septal thickening at the bases and a small right pleural effusion.  There was nonobstructive bilateral nephrolithiasis.  WBC 15.2, hemoglobin 10.5,  platelets 268,000.  LFTs were unremarkable.  Potassium 3.4 with serum creatinine 6.10.  the patient was started on azithromycin and ceftriaxone in the ED.  Assessment/Plan: Acute respiratory failure with hypoxia -Secondary to pulmonary edema and pneumonia -Check PCT -Presented with oxygen saturation 85% on room air -Currently stable on 2 L with saturation 95-96% -Wean oxygen as tolerated  Lobar pneumonia -Start vancomycin and cefepime -MRSA screen  Pulmonary edema -Patient endorses compliance with her dialysis and fluid restriction -Repeat echocardiogram -02/03/2019 echo EF 55 to 60%, + pseudonormalization -Chest x-ray and CTA chest as discussed above -Nephrology consulted  ESRD -Patient normally receives dialysis Monday, Wednesday, Fridays -Nephrology consulted--spoke with Dr. Justin Mend -Continue phosphate binders  Nonobstructive CAD -No chest pain presently -Continue aspirin -Continue Bystolic -July 3785 heart catheterization EF 55-65%, nonobstructive  Hyperlipidemia -Continue statin  Depression/anxiety -Continue Cymbalta  Invasive ductal carcinoma--right breast -Status post left mastectomy 06/15/2020 -Planning for partial right mastectomy on 07/13/2020--Dr. Mickeal Needy  Hypokalemia -manage per HD      Past Medical History:  Diagnosis Date  . (HFpEF) heart failure with preserved ejection fraction ([place])    a. 01/2019 Echo: EF 55-60%, mild conc LVH. DD.  Torn MV chordae.  . Anemia   . Atypical chest pain    a. 08/2018 MV: EF 59%, no ischemia; b. 02/2019 Cath: nonobs dzs.  . Blood transfusion without reported diagnosis   . Cataract   . ESRD (end stage renal disease) on dialysis ([place])    a. HD T, T, S  . Essential hypertension, benign   . GERD (gastroesophageal reflux disease)   .  Headache   . Hemorrhoids   . Mixed hyperlipidemia   . Morbid obesity ([place])   . Non-obstructive CAD (coronary artery disease)    a. 02/2019 CathL LM nl, LAD 1m, LCX nl, RCA 25p, EF  55-65%.  Marland Kitchen PONV (postoperative nausea and vomiting)   . S/P colonoscopy Jan 2011   Dr. Benson Norway: sessile polyp (benign lymphoid), large hemorrhoids, repeat 5-10 years  . Temporal arteritis ([place])   . Type 2 diabetes mellitus ([place])   . Wears glasses    Past Surgical History:  Procedure Laterality Date  . ABDOMINAL HYSTERECTOMY    . APPENDECTOMY    . ARTERY BIOPSY N/A 05/09/2018   Procedure: RIGHT TEMPORAL ARTERY BIOPSY;  Surgeon: Judeth Horn, MD;  Location: [facility];  Service: General;  Laterality: N/A;  . BREAST BIOPSY Right 06/15/2020   Procedure: RIGHT BREAST BIOPSY;  Surgeon: Aviva Signs, MD;  Location: [facility];  Service: General;  Laterality: Right;  . CATARACT EXTRACTION W/PHACO Left 02/09/2017   Procedure: CATARACT EXTRACTION PHACO AND INTRAOCULAR LENS PLACEMENT LEFT EYE;  Surgeon: Tonny Branch, MD;  Location: [facility];  Service: Ophthalmology;  Laterality: Left;  CDE: 4.89  . CATARACT EXTRACTION W/PHACO Right 06/04/2017   Procedure: CATARACT EXTRACTION PHACO AND INTRAOCULAR LENS PLACEMENT (IOC);  Surgeon: Tonny Branch, MD;  Location: [facility];  Service: Ophthalmology;  Laterality: Right;  CDE: 4.12  . CHOLECYSTECTOMY  09/29/2011   Procedure: LAPAROSCOPIC CHOLECYSTECTOMY;  Surgeon: Jamesetta So, MD;  Location: [facility];  Service: General;  Laterality: N/A;  . COLONOSCOPY  Jan 2011   Dr. Benson Norway: sessile polyp (benign lymphoid), large hemorrhoids, repeat 5-10 years  . COLONOSCOPY N/A 06/12/2016   Procedure: COLONOSCOPY;  Surgeon: Daneil Dolin, MD;  [facility];  Service: Endoscopy;  Laterality: N/A;  1230   . ESOPHAGOGASTRODUODENOSCOPY  09/05/2011   EPP:IRJJO hiatal hernia; remainder of exam normal. No explanation for patient's abdominal pain with today's examination  . ESOPHAGOGASTRODUODENOSCOPY N/A 12/17/2013   Dr. [name]: gastric erythema, erosion, mild chronic inflammation on path   . LAPAROSCOPIC APPENDECTOMY  09/29/2011   Procedure: APPENDECTOMY LAPAROSCOPIC;  Surgeon: Jamesetta So, MD;  Location: [facility];  Service: General;;  incidental appendectomy  . LEFT HEART CATH AND CORONARY ANGIOGRAPHY N/A 02/28/2019   Procedure: LEFT HEART CATH AND CORONARY ANGIOGRAPHY;  Surgeon: Jettie Booze, MD;  Location: [facility];  Service: Cardiovascular;  Laterality: N/A;  . MASTECTOMY MODIFIED RADICAL Right 02/18/2020   Procedure: MASTECTOMY MODIFIED RADICAL;  Surgeon: Aviva Signs, MD;  Location: [facility];  Service: General;  Laterality: Right;  . PARTIAL MASTECTOMY WITH NEEDLE LOCALIZATION AND AXILLARY SENTINEL LYMPH NODE BX Right 09/18/2018   Procedure: RIGHT PARTIAL MASTECTOMY AFTER NEEDLE LOCALIZATION, SENTINEL LYMPH NODE BIOPSY RIGHT AXILLA;  Surgeon: Aviva Signs, MD;  Location: [facility];  Service: General;  Laterality: Right;  . SIMPLE MASTECTOMY WITH AXILLARY SENTINEL NODE BIOPSY Left 06/15/2020   Procedure: LEFT SIMPLE MASTECTOMY;  Surgeon: Aviva Signs, MD;  Location: [facility];  Service: General;  Laterality: Left;   Social History:  reports that she has never smoked. She has never used smokeless tobacco. She reports that she does not drink alcohol and does not use drugs.   Family History  Problem Relation Age of Onset  . Hypertension Mother   . Coronary artery disease Mother   . Diabetes Mother   . Hypertension Sister   . Coronary artery disease Sister   . Hypertension Brother   . Heart attack Father   .  Hypertension Son   . Heart attack Maternal Aunt   . Hypertension Maternal Aunt   . Diabetes Maternal Aunt   . Heart attack Maternal Uncle   . Hypertension Maternal Uncle   . Diabetes Maternal Uncle   . Heart attack Paternal Aunt   . Hypertension Paternal Aunt   . Diabetes Paternal Aunt   . Heart attack Paternal Uncle   . Hypertension Paternal Uncle   . Diabetes Paternal Uncle   . Heart attack Maternal Grandmother   . Heart attack Maternal Grandfather   . Heart attack Paternal Grandmother   . Heart attack Paternal Grandfather   . Colon cancer Neg  Hx      No Known Allergies   Prior to Admission medications   Medication Sig Start Date End Date Taking? Authorizing Provider  amLODipine (NORVASC) 10 MG tablet Take 1 tablet (10 mg total) by mouth at bedtime. Patient taking differently: Take 10 mg by mouth in the morning and at bedtime.  04/29/20   [name], MD  anastrozole (ARIMIDEX) 1 MG tablet TAKE 1 TABLET(1 MG) BY MOUTH DAILY Patient taking differently: Take 1 mg by mouth daily.  04/29/20   Lockamy, Randi L, NP-C  aspirin EC 81 MG tablet Take 81 mg by mouth daily.    [provider]  atorvastatin (LIPITOR) 20 MG tablet Take 1 tablet (20 mg total) by mouth daily. Patient taking differently: Take 20 mg by mouth at bedtime.  06/29/20   [name], MD  B Complex-C-Zn-Folic Acid (DIALYVITE 149-FWYO 15) 0.8 MG TABS Take 1 tablet by mouth daily. 12/10/19   [provider]  benzonatate (TESSALON) 200 MG capsule Take 1 capsule (200 mg total) by mouth 2 (two) times daily as needed for cough. Patient not taking: Reported on 06/30/2020 03/22/20   [name], MD  cinacalcet (SENSIPAR) 30 MG tablet Take 2 tablets (60 mg total) by mouth daily. Patient taking differently: Take 30 mg by mouth daily with supper.  06/13/18   [name], MD  DULoxetine (CYMBALTA) 30 MG capsule TAKE 1 CAPSULE(30 MG) BY MOUTH DAILY Patient taking differently: Take 30 mg by mouth daily.  02/05/20   [name], MD  gabapentin (NEURONTIN) 300 MG capsule TAKE 1 CAPSULE(300 MG) BY MOUTH AT BEDTIME Patient taking differently: Take 300 mg by mouth at bedtime.  06/15/20   [name], MD  HYDROcodone-acetaminophen (NORCO) 5-325 MG tablet Take 1 tablet by mouth every 6 (six) hours as needed for moderate pain. 06/16/20   Aviva Signs, MD  lanthanum (FOSRENOL) 1000 MG chewable tablet Chew 2,000-3,000 mg by mouth See admin instructions. Take 3000 mg by mouth with meals and 2000 mg with snacks 10/16/19   [provider]    lidocaine-prilocaine (EMLA) cream Apply 1 application topically every Monday, Wednesday, and Friday with hemodialysis.  09/24/18   [provider]  nebivolol (BYSTOLIC) 10 MG tablet Take 1 tablet (10 mg total) by mouth in the morning and at bedtime. 05/13/20   [name], MD  pantoprazole (PROTONIX) 40 MG tablet TAKE 1 TABLET(40 MG) BY MOUTH DAILY Patient taking differently: Take 40 mg by mouth daily.  06/02/19   Roger Shelter, FNP    Review of Systems:  Constitutional:  No weight loss, night sweats, Fevers, chills, fatigue.  Head&Eyes: No headache.  No vision loss.  No eye pain or scotoma ENT:  No Difficulty swallowing,Tooth/dental problems,Sore throat,  No ear ache, post nasal drip,  Cardio-vascular:  No  chest pain, Orthopnea, PND, swelling in lower extremities,  dizziness, palpitations  GI:  No  abdominal pain, nausea, vomiting, diarrhea, loss of appetite, hematochezia, melena, heartburn, indigestion, Resp:  . No coughing up of blood .No wheezing.No chest wall deformity  Skin:  no rash or lesions.  GU:  no dysuria, change in color of urine, no urgency or frequency. No flank pain.  Musculoskeletal:  No joint pain or swelling. No decreased range of motion. No back pain.  Psych:  No change in mood or affect. No depression or anxiety. Neurologic: No headache, no dysesthesia, no focal weakness, no vision loss. No syncope  Physical Exam: Vitals:   07/03/20 0700 07/03/20 0800 07/03/20 0830 07/03/20 0900  BP: (!) 183/95 (!) 194/94 (!) 186/89 (!) 180/88  Pulse: 92 93 91 90  Resp: 18 (!) 23 (!) 35 (!) 30  Temp:      TempSrc:      SpO2: 91% 92% 93% 98%  Weight:      Height:       General:  A&O x 3, NAD, nontoxic, pleasant/cooperative Head/Eye: No conjunctival hemorrhage, no icterus, [place]/AT, No nystagmus ENT:  No icterus,  No thrush, good dentition, no pharyngeal exudate Neck:  No masses, no lymphadenpathy, no bruits CV:  RRR, no rub, no gallop, no S3 Lung:   Bibasilar crackles, R>L, no wheeze Abdomen: soft/NT, +BS, nondistended, no peritoneal signs Ext: No cyanosis, No rashes, No petechiae, No lymphangitis, No edema Neuro: CNII-XII intact, strength 4/5 in bilateral upper and lower extremities, no dysmetria  Labs on Admission:  Basic Metabolic Panel: Recent Labs  Lab 07/03/20 0229  NA 138  K 3.4*  CL 106  CO2 22  GLUCOSE 122*  BUN 23*  CREATININE 6.10*  CALCIUM 10.0   Liver Function Tests: Recent Labs  Lab 07/03/20 0229  AST 20  ALT 18  ALKPHOS 103  BILITOT 0.6  PROT 8.1  ALBUMIN 3.9   No results for input(s): LIPASE, AMYLASE in the last 168 hours. No results for input(s): AMMONIA in the last 168 hours. CBC: Recent Labs  Lab 07/03/20 0229  WBC 15.2*  NEUTROABS 12.9*  HGB 10.5*  HCT 33.7*  MCV 98.5  PLT 368   Coagulation Profile: No results for input(s): INR, PROTIME in the last 168 hours. Cardiac Enzymes: No results for input(s): CKTOTAL, CKMB, CKMBINDEX, TROPONINI in the last 168 hours. BNP: Invalid input(s): POCBNP CBG: No results for input(s): GLUCAP in the last 168 hours. Urine analysis:    Component Value Date/Time   COLORURINE YELLOW 10/03/2016 [place] 10/03/2016 0704   LABSPEC 1.010 10/03/2016 0704   PHURINE 6.0 10/03/2016 0704   GLUCOSEU 50 (A) 10/03/2016 0704   HGBUR NEGATIVE 10/03/2016 0704   BILIRUBINUR NEGATIVE 10/03/2016 0704   KETONESUR NEGATIVE 10/03/2016 0704   PROTEINUR 100 (A) 10/03/2016 0704   UROBILINOGEN 0.2 08/19/2011 1103   NITRITE NEGATIVE 10/03/2016 0704   LEUKOCYTESUR NEGATIVE 10/03/2016 0704   Sepsis Labs: @LABRCNTIP (procalcitonin:4,lacticidven:4) ) Recent Results (from the past 240 hour(s))  Resp Panel by RT-PCR (Flu A&B, Covid) Nasopharyngeal Swab     Status: None   Collection Time: 07/03/20  2:28 AM   Specimen: Nasopharyngeal Swab; Nasopharyngeal(NP) swabs in vial transport medium  Result Value Ref Range Status   SARS Coronavirus 2 by RT PCR NEGATIVE  NEGATIVE Final    Comment: (NOTE) SARS-CoV-2 target nucleic acids are NOT DETECTED.  The SARS-CoV-2 RNA is generally detectable in upper respiratory specimens during the acute phase of infection.  The lowest concentration of SARS-CoV-2 viral copies this assay can detect is 138 copies/mL. A negative result does not preclude SARS-Cov-2 infection and should not be used as the sole basis for treatment or other patient management decisions. A negative result may occur with  improper specimen collection/handling, submission of specimen other than nasopharyngeal swab, presence of viral mutation(s) within the areas targeted by this assay, and inadequate number of viral copies(<138 copies/mL). A negative result must be combined with clinical observations, patient history, and epidemiological information. The expected result is Negative.  Fact Sheet for Patients:  EntrepreneurPulse.com.au  Fact Sheet for Healthcare Providers:  IncredibleEmployment.be  This test is no t yet approved or cleared by the [place] FDA and  has been authorized for detection and/or diagnosis of SARS-CoV-2 by FDA under an Emergency Use Authorization (EUA). This EUA will remain  in effect (meaning this test can be used) for the duration of the COVID-19 declaration under Section 564(b)(1) of the Act, 21 U.S.C.section 360bbb-3(b)(1), unless the authorization is terminated  or revoked sooner.       Influenza A by PCR NEGATIVE NEGATIVE Final   Influenza B by PCR NEGATIVE NEGATIVE Final    Comment: (NOTE) The Xpert Xpress SARS-CoV-2/FLU/RSV plus assay is intended as an aid in the diagnosis of influenza from Nasopharyngeal swab specimens and should not be used as a sole basis for treatment. Nasal washings and aspirates are unacceptable for Xpert Xpress SARS-CoV-2/FLU/RSV testing.  Fact Sheet for Patients: EntrepreneurPulse.com.au  Fact Sheet for Healthcare  Providers: IncredibleEmployment.be  This test is not yet approved or cleared by the [place] FDA and has been authorized for detection and/or diagnosis of SARS-CoV-2 by FDA under an Emergency Use Authorization (EUA). This EUA will remain in effect (meaning this test can be used) for the duration of the COVID-19 declaration under Section 564(b)(1) of the Act, 21 U.S.C. section 360bbb-3(b)(1), unless the authorization is terminated or revoked.  Performed at [facility], [address] [place], [place] [address]      Radiological Exams on Admission: CT Angio Chest PE W/Cm &/Or Wo Cm  Result Date: 07/03/2020 CLINICAL DATA:  Shortness of breath with exertion today. EXAM: CT ANGIOGRAPHY CHEST WITH CONTRAST TECHNIQUE: Multidetector CT imaging of the chest was performed using the standard protocol during bolus administration of intravenous contrast. Multiplanar CT image reconstructions and MIPs were obtained to evaluate the vascular anatomy. CONTRAST:  147mL OMNIPAQUE IOHEXOL 350 MG/ML SOLN COMPARISON:  02/14/2019 FINDINGS: Cardiovascular: Satisfactory opacification of the pulmonary arteries to the segmental level. No evidence of pulmonary embolism. Enlarged heart size. No pericardial effusion. Aortic and coronary calcifications Mediastinum/Nodes: Prominent right piriform sinus which could be from incomplete coverage. The glottis itself appears symmetric. No noted chart history of hoarseness. Lungs/Pleura: Diffuse airway cuffing. Interlobular septal thickening at the bases. Small right pleural effusion. Patchy atelectasis on both sides. Upper Abdomen: Bilateral renal cortical thinning. At least 2 bilateral renal calculi measuring up to 3 mm. There is an 11 mm right upper pole high-density nodule, partially covered and nonspecific Musculoskeletal: No acute or aggressive finding. Other: Bilateral mastectomy. Review of the MIP images confirms the above findings. IMPRESSION: 1. CHF. 2.  Negative for pulmonary embolism. 3. Partially covered 11 mm lesion in the right kidney which could be hemorrhagic cyst or solid. Could attempt characterization with elective renal ultrasound. MRI would be the most definitive. 4. Bilateral nephrolithiasis. Electronically Signed   By: Monte Fantasia M.D.   On: 07/03/2020 08:35   DG Chest Port 1  View  Result Date: 07/03/2020 CLINICAL DATA:  Shortness of breath, hypoxia, history of bilateral mastectomies. EXAM: PORTABLE CHEST 1 VIEW COMPARISON:  Radiograph 03/17/2020 FINDINGS: Increasing vascular congestion and cuffing with fairly symmetric hazy mid to lower lung and perihilar predominant opacity. No pneumothorax or visible effusion. The aorta is calcified. The remaining cardiomediastinal contours are unremarkable. Postsurgical changes from bilateral mastectomy and right axillary nodal dissection. No other acute or significant osseous or soft tissue abnormalities. Telemetry leads and support devices overlie the chest. IMPRESSION: Appearance suggestive of pulmonary edema though infection could at a similar appearance in the appropriate clinical setting. Aortic Atherosclerosis (ICD10-I70.0). Electronically Signed   By: Lovena Le M.D.   On: 07/03/2020 03:50    EKG: Independently reviewed. Sinus, nonspecific ST changes    Time spent:60 minutes Code Status:   FULL Family Communication:  No Family at bedside Disposition Plan: expect 2 day hospitalization Consults called: renal--Webb DVT Prophylaxis: [place] Heparin    Orson Eva, DO  [facility] Pager [phone]  If 7PM-7AM, please contact night-coverage [email or web address] Password [facility] 07/03/2020, 9:58 AM

## 2020-07-03 NOTE — ED Provider Notes (Addendum)
This patient is a pleasant 59 year old female that presented with shortness of breath had some increasing coughing and being very short of breath on exertion.  She had a heart catheterization in July 2020 showing ejection fraction of 55 to 65% and nonobstructive coronary disease.  Last echocardiogram was in June 2020 showing ejection fraction of 55 to 60%  This patient gets dialysis Monday Wednesday Friday, had dialysis yesterday but has been persistently short of breath worsening with a cough.  Has seen her family doctor and has been through several cough medications without relief.  On exam she is tachypneic, hypoxic, gets worse with ambulation.  White blood cell count of 15,000, CT scan shows effusion but also some atelectasis or infiltrate on the right.  She does have pulmonary edema, this would be unusual given her recent dialysis.  She will need to be admitted for hypoxia in the presence of possible congestive heart failure or pneumonia.  She is agreeable to the plan  Megan [name] was evaluated in Emergency Department on 07/03/2020 for the symptoms described in the history of present illness. She was evaluated in the context of the global COVID-19 pandemic, which necessitated consideration that the patient might be at risk for infection with the SARS-CoV-2 virus that causes COVID-19. Institutional protocols and algorithms that pertain to the evaluation of patients at risk for COVID-19 are in a state of rapid change based on information released by regulatory bodies including the CDC and federal and state organizations. These policies and algorithms were followed during the patient's care in the ED.  CRITICAL CARE Performed by: Johnna Acosta Total critical care time: 35 minutes Critical care time was exclusive of separately billable procedures and treating other patients. Critical care was necessary to treat or prevent imminent or life-threatening deterioration. Critical care was time spent  personally by me on the following activities: development of treatment plan with patient and/or surrogate as well as nursing, discussions with consultants, evaluation of patient's response to treatment, examination of patient, obtaining history from patient or surrogate, ordering and performing treatments and interventions, ordering and review of laboratory studies, ordering and review of radiographic studies, pulse oximetry and re-evaluation of patient's condition.  Discussed with Dr. Carles Collet who will admit  Final diagnoses:  Shortness of breath  Hypoxia  Acute respiratory failure with hypoxemia (HCC)  Pleural effusion, right  Acute congestive heart failure, unspecified heart failure type ([place])  Renal cyst      Noemi Chapel, MD 07/03/20 0962    Noemi Chapel, MD 07/03/20 [place], [place], MD 07/03/20 [phone]

## 2020-07-03 NOTE — Progress Notes (Signed)
Pharmacy Antibiotic Note  Megan [name] is a 59 y.o. female admitted on 07/03/2020 with pneumonia.  Pharmacy has been consulted for vancomycin and cefepime dosing.  Plan: Vancomycin 750mg  IV every MWF HD  hours.  Goal trough 15-20 mcg/mL. cefepime 1gm iv q24h   Height: 5\' 2"  (157.5 cm) Weight: 76.2 kg (168 lb) IBW/kg (Calculated) : 50.1  Temp (24hrs), Avg:98.5 F (36.9 C), Min:98.5 F (36.9 C), Max:98.5 F (36.9 C)  Recent Labs  Lab 07/03/20 0229  WBC 15.2*  CREATININE 6.10*    Estimated Creatinine Clearance: 9.6 mL/min (A) (by C-G formula based on SCr of 6.1 mg/dL (H)).    No Known Allergies  Antimicrobials this admission: 11/20 cefepime >>  11/20 vancomycin >>   Microbiology results: 11/20 Covid: negative   11/20 MRSA PCR: sent   Thank you for allowing pharmacy to be a part of this patient's care.  Donna Christen Khaliel Morey 07/03/2020 10:30 AM

## 2020-07-03 NOTE — ED Triage Notes (Addendum)
POV from home.  SOB with exertion today.  L mastectomy this month R mastectomy in July

## 2020-07-03 NOTE — ED Notes (Signed)
Patient ambulated to bathroom with pulse Ox. O2 sat 88% on room air with ambulation. Patient reports shortness of breath with the exertion of ambulating. Gait slightly unsteady, one assist needed.

## 2020-07-03 NOTE — ED Notes (Addendum)
Waiting for pharmacy to bring cefepime.

## 2020-07-03 NOTE — ED Provider Notes (Signed)
[facility] EMERGENCY DEPARTMENT Provider Note   CSN: [id number] Arrival date & time: 07/03/20  0059   Time seen 3:10 AM  History Chief Complaint  Patient presents with  . Shortness of Breath    Megan [name] is a 59 y.o. female.  HPI   Patient reports about 5 PM today, November 19 she was washing her hands in the bathroom and she felt short of breath and it lasted 3 to 4 minutes.  Later on about 1230 this morning she was again in the bathroom and had shortness of breath again.  She denies any chest pain.  She states she has been coughing now for a few months and she coughs up clear mucus.  She also has some clear rhinorrhea without sneezing.  She denies sore throat, fever, swelling of her extremities.  She states she has never had this happen before.  Patient states she got her Planada booster today.  Patient has end-stage renal disease and has been doing dialysis since 2017.  She had her dialysis today.  She states she has been losing weight and they have dropped her dry weight from 76.5 down to 76 pounds today.  She states today predialysis her weight was 76.8 and afterwards was 75.9.  Patient denies smoking or having any prior pulmonary problems.  Patient most recently has been dealing with breast cancer.  She was diagnosed with breast cancer on the right in February 2020.  She was stage Ib.  She underwent chemotherapy starting in March 2020.  She had a prophylactic left mastectomy done on November 2.  She had a lump appear on her right breast and had biopsy done on November 30 again positive for cancer.  She is scheduled to have a right partial mastectomy done in 3 days.  PCP [name], MD Surgery Dr Mickeal Needy  Past Medical History:  Diagnosis Date  . (HFpEF) heart failure with preserved ejection fraction ([place])    a. 01/2019 Echo: EF 55-60%, mild conc LVH. DD.  Torn MV chordae.  . Anemia   . Atypical chest pain    a. 08/2018 MV: EF 59%, no ischemia; b.  02/2019 Cath: nonobs dzs.  . Blood transfusion without reported diagnosis   . Cataract   . ESRD (end stage renal disease) on dialysis ([place])    a. HD T, T, S  . Essential hypertension, benign   . GERD (gastroesophageal reflux disease)   . Headache   . Hemorrhoids   . Mixed hyperlipidemia   . Morbid obesity ([place])   . Non-obstructive CAD (coronary artery disease)    a. 02/2019 CathL LM nl, LAD 3m, LCX nl, RCA 25p, EF 55-65%.  Marland Kitchen PONV (postoperative nausea and vomiting)   . S/P colonoscopy Jan 2011   Dr. Benson Norway: sessile polyp (benign lymphoid), large hemorrhoids, repeat 5-10 years  . Temporal arteritis ([place])   . Type 2 diabetes mellitus ([place])   . Wears glasses     Patient Active Problem List   Diagnosis Date Noted  . S/P left mastectomy 06/15/2020  . Allergy, unspecified, initial encounter 03/03/2020  . Anaphylactic shock, unspecified, initial encounter 03/03/2020  . Breast cancer ([place]) 02/18/2020  . History of modified radical mastectomy of right breast 02/18/2020  . S/P mastectomy, right 02/18/2020  . ESRD (end stage renal disease) on dialysis ([place])   . Grade III hemorrhoids 07/31/2019  . Fluid overload, unspecified 05/24/2019  . Anaphylactic reaction due to adverse effect of correct drug or medicament properly  administered, initial encounter 04/30/2019  . Unspecified protein-calorie malnutrition ([place]) 03/03/2019  . Hypertensive heart disease with heart failure ([place]) 03/01/2019  . Atherosclerotic heart disease of native coronary artery without angina pectoris 03/01/2019  . Chest pain 02/28/2019  . Chest pain with high risk for cardiac etiology 02/28/2019  . Elevated troponin 02/03/2019  . ESRD (end stage renal disease) ([place]) 02/03/2019  . Acute diastolic CHF (congestive heart failure) ([place]) 02/03/2019  . Hypokalemia 02/03/2019  . Acute respiratory failure with hypoxia ([place]) 02/03/2019  . 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[place])   . Hypocalcemia  09/03/2018  . Hypothyroidism, unspecified 09/21/2017  . Anemia in chronic kidney disease 04/12/2017  . Iron deficiency anemia, unspecified 04/12/2017  . Abnormal results of kidney function studies 04/11/2017  . Acidosis 04/11/2017  . Anemia, unspecified 04/11/2017  . Coagulation defect, unspecified ([place]) 04/11/2017  . 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, with stage 5 chronic kidney disease, or end stage renal disease ([place]) 04/11/2017  . 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 Other disorders of phosphorus metabolism 04/11/2017  . Pain, unspecified 04/11/2017  . Proteinuria, unspecified 04/11/2017  . Pruritus, unspecified 04/11/2017  . Unspecified kidney failure 04/11/2017  . Encounter for pre-transplant evaluation for kidney transplant 04/04/2017  . Rectal bleeding 06/08/2016  . Vaginal discharge 12/30/2015  . Abdominal pain, epigastric 11/26/2013  . GERD (gastroesophageal reflux disease)   . Type 2 diabetes mellitus ([place])   . CKD (chronic kidney disease) stage 3, GFR 30-59 ml/min (HCC)   . External hemorrhoid 08/01/2011  . Hematochezia 07/13/2011  . Dyspnea 04/21/2010  . Atypical chest pain 04/21/2010  . Mixed hyperlipidemia 03/11/2008  . OBESITY 03/11/2008  . Essential hypertension, benign 03/11/2008    Past Surgical History:  Procedure Laterality Date  . ABDOMINAL HYSTERECTOMY    . APPENDECTOMY    . ARTERY BIOPSY N/A 05/09/2018   Procedure: RIGHT TEMPORAL ARTERY BIOPSY;  Surgeon: Judeth Horn, MD;  Location: [facility];  Service: General;  Laterality: N/A;  . BREAST BIOPSY Right 06/15/2020   Procedure: RIGHT BREAST BIOPSY;  Surgeon: Aviva Signs, MD;  Location: [facility];  Service: General;  Laterality: Right;  . CATARACT EXTRACTION W/PHACO Left 02/09/2017   Procedure: CATARACT EXTRACTION PHACO AND INTRAOCULAR LENS PLACEMENT LEFT EYE;  Surgeon: Tonny Branch, MD;  Location: [facility];  Service: Ophthalmology;   Laterality: Left;  CDE: 4.89  . CATARACT EXTRACTION W/PHACO Right 06/04/2017   Procedure: CATARACT EXTRACTION PHACO AND INTRAOCULAR LENS PLACEMENT (IOC);  Surgeon: Tonny Branch, MD;  Location: [facility];  Service: Ophthalmology;  Laterality: Right;  CDE: 4.12  . CHOLECYSTECTOMY  09/29/2011   Procedure: LAPAROSCOPIC CHOLECYSTECTOMY;  Surgeon: Jamesetta So, MD;  Location: [facility];  Service: General;  Laterality: N/A;  . COLONOSCOPY  Jan 2011   Dr. Benson Norway: sessile polyp (benign lymphoid), large hemorrhoids, repeat 5-10 years  . COLONOSCOPY N/A 06/12/2016   Procedure: COLONOSCOPY;  Surgeon: Daneil Dolin, MD;  [facility];  Service: Endoscopy;  Laterality: N/A;  1230   . ESOPHAGOGASTRODUODENOSCOPY  09/05/2011   ZLD:JTTSV hiatal hernia; remainder of exam normal. No explanation for patient's abdominal pain with today's examination  . ESOPHAGOGASTRODUODENOSCOPY N/A 12/17/2013   Dr. [name]: gastric erythema, erosion, mild chronic inflammation on path   . LAPAROSCOPIC APPENDECTOMY  09/29/2011   Procedure: APPENDECTOMY LAPAROSCOPIC;  Surgeon: Jamesetta So, MD;  Location: [facility];  Service: General;;  incidental appendectomy  . LEFT HEART CATH AND  CORONARY ANGIOGRAPHY N/A 02/28/2019   Procedure: LEFT HEART CATH AND CORONARY ANGIOGRAPHY;  Surgeon: Jettie Booze, MD;  Location: [facility];  Service: Cardiovascular;  Laterality: N/A;  . MASTECTOMY MODIFIED RADICAL Right 02/18/2020   Procedure: MASTECTOMY MODIFIED RADICAL;  Surgeon: Aviva Signs, MD;  Location: [facility];  Service: General;  Laterality: Right;  . PARTIAL MASTECTOMY WITH NEEDLE LOCALIZATION AND AXILLARY SENTINEL LYMPH NODE BX Right 09/18/2018   Procedure: RIGHT PARTIAL MASTECTOMY AFTER NEEDLE LOCALIZATION, SENTINEL LYMPH NODE BIOPSY RIGHT AXILLA;  Surgeon: Aviva Signs, MD;  Location: [facility];  Service: General;  Laterality: Right;  . SIMPLE MASTECTOMY WITH AXILLARY SENTINEL NODE BIOPSY Left 06/15/2020   Procedure: LEFT SIMPLE  MASTECTOMY;  Surgeon: Aviva Signs, MD;  Location: [facility];  Service: General;  Laterality: Left;     OB History    Gravida      Para      Term      Preterm      AB      Living  1     SAB      TAB      Ectopic      Multiple      Live Births              Family History  Problem Relation Age of Onset  . Hypertension Mother   . Coronary artery disease Mother   . Diabetes Mother   . Hypertension Sister   . Coronary artery disease Sister   . Hypertension Brother   . Heart attack Father   . Hypertension Son   . Heart attack Maternal Aunt   . Hypertension Maternal Aunt   . Diabetes Maternal Aunt   . Heart attack Maternal Uncle   . Hypertension Maternal Uncle   . Diabetes Maternal Uncle   . Heart attack Paternal Aunt   . Hypertension Paternal Aunt   . Diabetes Paternal Aunt   . Heart attack Paternal Uncle   . Hypertension Paternal Uncle   . Diabetes Paternal Uncle   . Heart attack Maternal Grandmother   . Heart attack Maternal Grandfather   . Heart attack Paternal Grandmother   . Heart attack Paternal Grandfather   . Colon cancer Neg Hx     Social History   Tobacco Use  . Smoking status: Never Smoker  . Smokeless tobacco: Never Used  Vaping Use  . Vaping Use: Never used  Substance Use Topics  . Alcohol use: No  . Drug use: No    Home Medications Prior to Admission medications   Medication Sig Start Date End Date Taking? Authorizing Provider  amLODipine (NORVASC) 10 MG tablet Take 1 tablet (10 mg total) by mouth at bedtime. Patient taking differently: Take 10 mg by mouth in the morning and at bedtime.  04/29/20   [name], MD  anastrozole (ARIMIDEX) 1 MG tablet TAKE 1 TABLET(1 MG) BY MOUTH DAILY Patient taking differently: Take 1 mg by mouth daily.  04/29/20   Lockamy, Randi L, NP-C  aspirin EC 81 MG tablet Take 81 mg by mouth daily.    [provider]  atorvastatin (LIPITOR) 20 MG tablet Take 1 tablet (20 mg total) by mouth  daily. Patient taking differently: Take 20 mg by mouth at bedtime.  06/29/20   [name], MD  B Complex-C-Zn-Folic Acid (DIALYVITE 270-WCBJ 15) 0.8 MG TABS Take 1 tablet by mouth daily. 12/10/19   [provider]  benzonatate (TESSALON) 200 MG capsule Take 1  capsule (200 mg total) by mouth 2 (two) times daily as needed for cough. Patient not taking: Reported on 06/30/2020 03/22/20   [name], MD  cinacalcet (SENSIPAR) 30 MG tablet Take 2 tablets (60 mg total) by mouth daily. Patient taking differently: Take 30 mg by mouth daily with supper.  06/13/18   [name], MD  DULoxetine (CYMBALTA) 30 MG capsule TAKE 1 CAPSULE(30 MG) BY MOUTH DAILY Patient taking differently: Take 30 mg by mouth daily.  02/05/20   [name], MD  gabapentin (NEURONTIN) 300 MG capsule TAKE 1 CAPSULE(300 MG) BY MOUTH AT BEDTIME Patient taking differently: Take 300 mg by mouth at bedtime.  06/15/20   [name], MD  HYDROcodone-acetaminophen (NORCO) 5-325 MG tablet Take 1 tablet by mouth every 6 (six) hours as needed for moderate pain. 06/16/20   Aviva Signs, MD  lanthanum (FOSRENOL) 1000 MG chewable tablet Chew 2,000-3,000 mg by mouth See admin instructions. Take 3000 mg by mouth with meals and 2000 mg with snacks 10/16/19   [provider]  lidocaine-prilocaine (EMLA) cream Apply 1 application topically every Monday, Wednesday, and Friday with hemodialysis.  09/24/18   [provider]  nebivolol (BYSTOLIC) 10 MG tablet Take 1 tablet (10 mg total) by mouth in the morning and at bedtime. 05/13/20   [name], MD  pantoprazole (PROTONIX) 40 MG tablet TAKE 1 TABLET(40 MG) BY MOUTH DAILY Patient taking differently: Take 40 mg by mouth daily.  06/02/19   Roger Shelter, FNP    Allergies    Patient has no known allergies.  Review of Systems   Review of Systems  All other systems reviewed and are negative.   Physical Exam Updated Vital Signs BP (!) 192/93    Pulse 89   Temp 98.5 F (36.9 C) (Oral)   Resp (!) 29   Ht 5\' 2"  (1.575 m)   Wt 76.2 kg   SpO2 95%   BMI 30.73 kg/m   Physical Exam Vitals and nursing note reviewed.  Constitutional:      General: She is not in acute distress.    Appearance: Normal appearance. She is normal weight.  HENT:     Head: Normocephalic and atraumatic.     Right Ear: External ear normal.     Left Ear: External ear normal.  Eyes:     Extraocular Movements: Extraocular movements intact.     Conjunctiva/sclera: Conjunctivae normal.     Pupils: Pupils are equal, round, and reactive to light.  Cardiovascular:     Rate and Rhythm: Normal rate and regular rhythm.  Pulmonary:     Effort: Pulmonary effort is normal. No respiratory distress.     Breath sounds: Normal breath sounds.  Musculoskeletal:        General: No swelling. Normal range of motion.     Cervical back: Normal range of motion and neck supple.  Skin:    General: Skin is warm and dry.  Neurological:     General: No focal deficit present.     Mental Status: She is alert and oriented to person, place, and time.     Cranial Nerves: No cranial nerve deficit.  Psychiatric:        Mood and Affect: Mood normal.        Behavior: Behavior normal.        Thought Content: Thought content normal.     ED Results / Procedures / Treatments   Labs (all labs ordered are listed, but  only abnormal results are displayed) Results for orders placed or performed during the hospital encounter of 07/03/20  Resp Panel by RT-PCR (Flu A&B, Covid) Nasopharyngeal Swab   Specimen: Nasopharyngeal Swab; Nasopharyngeal(NP) swabs in vial transport medium  Result Value Ref Range   SARS Coronavirus 2 by RT PCR NEGATIVE NEGATIVE   Influenza A by PCR NEGATIVE NEGATIVE   Influenza B by PCR NEGATIVE NEGATIVE  Comprehensive metabolic panel  Result Value Ref Range   Sodium 138 135 - 145 mmol/L   Potassium 3.4 (L) 3.5 - 5.1 mmol/L   Chloride 106 98 - 111 mmol/L   CO2 22  22 - 32 mmol/L   Glucose, Bld 122 (H) 70 - 99 mg/dL   BUN 23 (H) 6 - 20 mg/dL   Creatinine, Ser 6.10 (H) 0.44 - 1.00 mg/dL   Calcium 10.0 8.9 - 10.3 mg/dL   Total Protein 8.1 6.5 - 8.1 g/dL   Albumin 3.9 3.5 - 5.0 g/dL   AST 20 15 - 41 U/L   ALT 18 0 - 44 U/L   Alkaline Phosphatase 103 38 - 126 U/L   Total Bilirubin 0.6 0.3 - 1.2 mg/dL   GFR, Estimated 7 (L) >60 mL/min   Anion gap 10 5 - 15  CBC with Differential  Result Value Ref Range   WBC 15.2 (H) 4.0 - 10.5 K/uL   RBC 3.42 (L) 3.87 - 5.11 MIL/uL   Hemoglobin 10.5 (L) 12.0 - 15.0 g/dL   HCT 33.7 (L) 36 - 46 %   MCV 98.5 80.0 - 100.0 fL   MCH 30.7 26.0 - 34.0 pg   MCHC 31.2 30.0 - 36.0 g/dL   RDW 16.8 (H) 11.5 - 15.5 %   Platelets 368 150 - 400 K/uL   nRBC 0.0 0.0 - 0.2 %   Neutrophils Relative % 85 %   Neutro Abs 12.9 (H) 1.7 - 7.7 K/uL   Lymphocytes Relative 6 %   Lymphs Abs 0.9 0.7 - 4.0 K/uL   Monocytes Relative 6 %   Monocytes Absolute 0.9 0.1 - 1.0 K/uL   Eosinophils Relative 2 %   Eosinophils Absolute 0.3 0.0 - 0.5 K/uL   Basophils Relative 1 %   Basophils Absolute 0.1 0.0 - 0.1 K/uL   Immature Granulocytes 0 %   Abs Immature Granulocytes 0.06 0.00 - 0.07 K/uL  D-dimer, quantitative  Result Value Ref Range   D-Dimer, Quant 1.36 (H) 0.00 - 0.50 ug/mL-FEU  Brain natriuretic peptide  Result Value Ref Range   B Natriuretic Peptide 2,129.0 (H) 0.0 - 100.0 pg/mL   Laboratory interpretation all normal except elevated D-dimer, elevated BNP, leukocytosis, stable anemia due to her end-stage renal disease,    EKG  EKG Interpretation  Date/Time:  Saturday July 03 2020 01:25:32 EST Ventricular Rate:  92 PR Interval:    QRS Duration: 100 QT Interval:  424 QTC Calculation: 525 R Axis:   21 Text Interpretation: Sinus rhythm Probable left atrial enlargement Prolonged QT interval Since last tracing rate faster 08 Sep 2019 Confirmed by Rolland Porter [phone]) on 07/03/2020 3:56:18 AM   Radiology DG Chest Port 1  View  Result Date: 07/03/2020 CLINICAL DATA:  Shortness of breath, hypoxia, history of bilateral mastectomies. EXAM: PORTABLE CHEST 1 VIEW COMPARISON:  Radiograph 03/17/2020 FINDINGS: Increasing vascular congestion and cuffing with fairly symmetric hazy mid to lower lung and perihilar predominant opacity. No pneumothorax or visible effusion. The aorta is calcified. The remaining cardiomediastinal contours are unremarkable. Postsurgical changes from bilateral mastectomy  and right axillary nodal dissection. No other acute or significant osseous or soft tissue abnormalities. Telemetry leads and support devices overlie the chest. IMPRESSION: Appearance suggestive of pulmonary edema though infection could at a similar appearance in the appropriate clinical setting. Aortic Atherosclerosis (ICD10-I70.0). Electronically Signed   By: Lovena Le M.D.   On: 07/03/2020 03:50    Procedures Procedures (including critical care time)  Medications Ordered in ED Medications  iohexol (OMNIPAQUE) 350 MG/ML injection 100 mL (100 mLs Intravenous Contrast Given 07/03/20 0704)    ED Course  I have reviewed the triage vital signs and the nursing notes.  Pertinent labs & imaging results that were available during my care of the patient were reviewed by me and considered in my medical decision making (see chart for details).    MDM Rules/Calculators/A&P                         Patient's pulse ox in triage was 85% on room air.  At the time of my exam on nasal cannula oxygen 2 L her pulse ox was 95%.  She states she felt better after having the oxygen placed.  We discussed that she is at increased risk for blood clots due to her underlying cancer.  She is agreeable to doing a CTA of her chest to look for PE.  She has been on dialysis greater than a year so she can get the contrast.  Covid testing was done of course.  8:00 AM patient was turned over to Dr. Sabra Heck at change of shift to get results of her CTA.  Final  Clinical Impression(s) / ED Diagnoses Final diagnoses:  Shortness of breath  Hypoxia    Rx / DC Orders  Disposition pending  Rolland Porter, MD, Barbette Or, MD 07/03/20 [phone]

## 2020-07-03 NOTE — Progress Notes (Signed)
  Dialyzes at [facility]- MWF 3.5 hours  EDW 78.5. HD Bath 2/2.5, Dialyzer 180, 400 BFR- Heparin yes. Access left AVF-  15 guage.,  darbe 80 mcg weekly, venofer 50 weekly, calcitriol 1 mcg tiw  Holiday schedule   Will dialyze 11/21   Discussed with renal nurse

## 2020-07-04 ENCOUNTER — Inpatient Hospital Stay (HOSPITAL_COMMUNITY): Payer: Medicare Other

## 2020-07-04 ENCOUNTER — Inpatient Hospital Stay (HOSPITAL_BASED_OUTPATIENT_CLINIC_OR_DEPARTMENT_OTHER): Payer: Medicare Other

## 2020-07-04 DIAGNOSIS — N2581 Secondary hyperparathyroidism of renal origin: Secondary | ICD-10-CM | POA: Diagnosis not present

## 2020-07-04 DIAGNOSIS — J181 Lobar pneumonia, unspecified organism: Secondary | ICD-10-CM | POA: Diagnosis not present

## 2020-07-04 DIAGNOSIS — J9601 Acute respiratory failure with hypoxia: Secondary | ICD-10-CM | POA: Diagnosis not present

## 2020-07-04 DIAGNOSIS — I5031 Acute diastolic (congestive) heart failure: Secondary | ICD-10-CM

## 2020-07-04 DIAGNOSIS — J90 Pleural effusion, not elsewhere classified: Secondary | ICD-10-CM | POA: Diagnosis not present

## 2020-07-04 DIAGNOSIS — I120 Hypertensive chronic kidney disease with stage 5 chronic kidney disease or end stage renal disease: Secondary | ICD-10-CM | POA: Diagnosis not present

## 2020-07-04 DIAGNOSIS — N281 Cyst of kidney, acquired: Secondary | ICD-10-CM | POA: Diagnosis not present

## 2020-07-04 DIAGNOSIS — Z992 Dependence on renal dialysis: Secondary | ICD-10-CM | POA: Diagnosis not present

## 2020-07-04 DIAGNOSIS — N250 Renal osteodystrophy: Secondary | ICD-10-CM | POA: Diagnosis not present

## 2020-07-04 DIAGNOSIS — N186 End stage renal disease: Secondary | ICD-10-CM | POA: Diagnosis not present

## 2020-07-04 DIAGNOSIS — E1129 Type 2 diabetes mellitus with other diabetic kidney complication: Secondary | ICD-10-CM | POA: Diagnosis not present

## 2020-07-04 DIAGNOSIS — J810 Acute pulmonary edema: Secondary | ICD-10-CM | POA: Diagnosis not present

## 2020-07-04 LAB — CBC
HCT: 28.9 % — ABNORMAL LOW (ref 36.0–46.0)
Hemoglobin: 8.9 g/dL — ABNORMAL LOW (ref 12.0–15.0)
MCH: 30.9 pg (ref 26.0–34.0)
MCHC: 30.8 g/dL (ref 30.0–36.0)
MCV: 100.3 fL — ABNORMAL HIGH (ref 80.0–100.0)
Platelets: 299 10*3/uL (ref 150–400)
RBC: 2.88 MIL/uL — ABNORMAL LOW (ref 3.87–5.11)
RDW: 17 % — ABNORMAL HIGH (ref 11.5–15.5)
WBC: 10.2 10*3/uL (ref 4.0–10.5)
nRBC: 0 % (ref 0.0–0.2)

## 2020-07-04 LAB — PROCALCITONIN: Procalcitonin: 0.66 ng/mL

## 2020-07-04 LAB — ECHOCARDIOGRAM COMPLETE
Area-P 1/2: 4.52 cm2
Height: 62 in
S' Lateral: 3.18 cm
Weight: 2688 oz

## 2020-07-04 LAB — MRSA PCR SCREENING: MRSA by PCR: NEGATIVE

## 2020-07-04 MED ORDER — SODIUM CHLORIDE 0.9 % IV SOLN: 100.0000 mL | INTRAVENOUS | Status: DC | PRN

## 2020-07-04 MED ORDER — PENTAFLUOROPROP-TETRAFLUOROETH EX AERO: 1.0000 "application " | INHALATION_SPRAY | CUTANEOUS | Status: DC | PRN

## 2020-07-04 MED ORDER — LIDOCAINE HCL (PF) 1 % IJ SOLN: 5.0000 mL | INTRAMUSCULAR | Status: DC | PRN

## 2020-07-04 MED ORDER — LIDOCAINE-PRILOCAINE 2.5-2.5 % EX CREA: 1.0000 "application " | TOPICAL_CREAM | CUTANEOUS | Status: DC | PRN

## 2020-07-04 MED ORDER — CALCITRIOL 1 MCG/ML IV SOLN
1.0000 ug | INTRAVENOUS | Status: DC
  Filled 2020-07-04: qty 1

## 2020-07-04 MED ORDER — AMOXICILLIN-POT CLAVULANATE 500-125 MG PO TABS: 1.0000 | ORAL_TABLET | Freq: Three times a day (TID) | ORAL | 0 refills | Status: AC

## 2020-07-04 NOTE — Progress Notes (Signed)
*  PRELIMINARY RESULTS* Echocardiogram 2D Echocardiogram has been performed.  Leavy Cella 07/04/2020, 9:18 AM

## 2020-07-04 NOTE — Care Management CC44 (Signed)
Condition Code 44 Documentation Completed  Patient Details  Name: Megan [name] MRN: [id number] Date of Birth: [birth year]   Condition Code 44 given:  Yes Patient signature on Condition Code 44 notice:  Yes Documentation of 2 MD's agreement:  Yes Code 44 added to claim:  Yes    Natasha Bence, LCSW 07/04/2020, 12:22 PM

## 2020-07-04 NOTE — Discharge Summary (Signed)
Physician Discharge Summary  [name] ZCH:[id number] DOB: [birth year] DOA: 07/03/2020  PCP: [name], MD  Admit date: 07/03/2020  Discharge date: 07/04/2020  Admitted From:Home  Disposition:  Home  Recommendations for Outpatient Follow-up:  1. Follow up with PCP in 1-2 weeks 2. Continue on Augmentin as prescribed for 4 more days to complete 5-day course of treatment for pneumonia 3. Continue on prior medications as prescribed 4. Attend routine hemodialysis as previous 5. Planning for partial right mastectomy on 07/13/2020 with Dr. Arnoldo Morale  Home Health: None  Equipment/Devices: None  Discharge Condition: Stable  CODE STATUS: Full  Diet recommendation: Renal diet  Brief/Interim Summary: Per HPI: Megan [name] is a 59 y.o. female with medical history of ESRD (MWF), right breast cancer, hypertension, hypothyroidism, iron deficiency, GERD, nonobstructive CAD, hyperlipidemia presenting with shortness of breath that began around 5 PM on 07/03/2019.  The patient states that she has been compliant with her dialysis sessions.  She has not missed any outpatient dialysis.  She states that she has been staying the whole time.  In addition, she states that she has been for the most part compliant with her fluid restriction and dietary intake.  Nevertheless, she states that the dialysis center has been trying to decrease her dry weight over the past 2 to 3 weeks.  She states that her shortness of breath and dyspnea on exertion lasted about 10 minutes.  However when she woke up around 12:30 AM on 07/03/2020 she states that she became short of breath again getting to the bathroom.  She denied any fevers, chills, chest pain, coughing, hemoptysis, nausea, vomiting, diarrhea, abdominal pain.  She states that she did receive the [place] booster vaccination for COVID-19 on 07/02/2020.  She has not received any new medications.  She denies any worsening lower extremity edema or  increasing abdominal girth.  She does complain of nonproductive cough without hemoptysis In the emergency department, she was afebrile and hemodynamically stable with oxygen saturation 85% on room air.  She was placed on 2 L with saturations up to 95%.  Chest x-ray showed increased interstitial markings and R>L lower lobe opacity.  CTA chest was negative for PE but showed diffuse airway cuffing with interlobular septal thickening at the bases and a small right pleural effusion.  There was nonobstructive bilateral nephrolithiasis.  WBC 15.2, hemoglobin 10.5, platelets 268,000.  LFTs were unremarkable.  Potassium 3.4 with serum creatinine 6.10.  the patient was started on azithromycin and ceftriaxone in the ED.  -Patient was admitted with acute hypoxemic respiratory failure suspected to be related to lobar pneumonia as well as pulmonary edema with possibility of CHF.  Her 2D echocardiogram demonstrates LVEF 60-65% with grade 2 diastolic dysfunction.  CT of her chest did not demonstrate a PE, nor did it demonstrate any significant findings of pneumonia, but there were findings of CHF.  She will diuresed today with hemodialysis and overall is feeling much better today.  She is eager for discharge and agrees to continue on antibiotics as prescribed for several more days and complete her dialysis prior to discharge.  She has no further shortness of breath or other concerns.  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  Discharge  Instructions  Discharge Instructions    Diet - low sodium heart healthy   Complete by: As directed    Increase activity slowly   Complete by: As directed    No wound care   Complete by: As directed      Allergies as of 07/04/2020   No Known Allergies     Medication List    TAKE these medications   amLODipine 10 MG tablet Commonly known as: NORVASC Take 1 tablet (10 mg total) by mouth at bedtime. What changed: when to take this   amoxicillin-clavulanate 500-125 MG tablet Commonly known as: Augmentin Take 1 tablet (500 mg total) by mouth 3 (three) times daily for 4 days.   anastrozole 1 MG tablet Commonly known as: ARIMIDEX TAKE 1 TABLET(1 MG) BY MOUTH DAILY What changed: See the new instructions.   aspirin EC 81 MG tablet Take 81 mg by mouth daily.   atorvastatin 20 MG tablet Commonly known as: LIPITOR Take 1 tablet (20 mg total) by mouth daily. What changed: when to take this   benzonatate 200 MG capsule Commonly known as: TESSALON Take 1 capsule (200 mg total) by mouth 2 (two) times daily as needed for cough.   cinacalcet 30 MG tablet Commonly known as: SENSIPAR Take 2 tablets (60 mg total) by mouth daily. What changed:   how much to take  when to take this   Dialyvite 800-Zinc 15 0.8 MG Tabs Take 1 tablet by mouth daily.   DULoxetine 30 MG capsule Commonly known as: CYMBALTA TAKE 1 CAPSULE(30 MG) BY MOUTH DAILY What changed: See the new instructions.   Fosrenol 1000 MG chewable tablet Generic drug: lanthanum Chew 2,000-3,000 mg by mouth See admin instructions. Take 3000 mg by mouth with meals and 2000 mg with snacks   gabapentin 300 MG capsule Commonly known as: NEURONTIN TAKE 1 CAPSULE(300 MG) BY MOUTH AT BEDTIME What changed: See the new instructions.   HYDROcodone-acetaminophen 5-325 MG tablet Commonly known as: Norco Take 1 tablet by mouth every 6 (six) hours as needed for moderate pain.   lidocaine-prilocaine cream Commonly known as:  EMLA Apply 1 application topically every Monday, Wednesday, and Friday with hemodialysis.   nebivolol 10 MG tablet Commonly known as: Bystolic Take 1 tablet (10 mg total) by mouth in the morning and at bedtime.   pantoprazole 40 MG tablet Commonly known as: PROTONIX TAKE 1 TABLET(40 MG) BY MOUTH DAILY What changed:   how much to take  how to take this  when to take this  additional instructions       Follow-up Information    [name], MD Follow up in 1 week(s).   Specialty: Family Medicine Contact information: [address] [place] [address] [phone]              No Known Allergies  Consultations:  Nephrology   Procedures/Studies: CT Angio Chest PE W/Cm &/Or Wo Cm  Result Date: 07/03/2020 CLINICAL DATA:  Shortness of breath with exertion today. EXAM: CT ANGIOGRAPHY CHEST WITH CONTRAST TECHNIQUE: Multidetector CT imaging of the chest was performed using the standard protocol during bolus administration of intravenous contrast. Multiplanar CT image reconstructions and MIPs were obtained to evaluate the vascular anatomy. CONTRAST:  175mL OMNIPAQUE IOHEXOL 350 MG/ML SOLN COMPARISON:  02/14/2019 FINDINGS: Cardiovascular: Satisfactory opacification of the pulmonary arteries to the segmental  level. No evidence of pulmonary embolism. Enlarged heart size. No pericardial effusion. Aortic and coronary calcifications Mediastinum/Nodes: Prominent right piriform sinus which could be from incomplete coverage. The glottis itself appears symmetric. No noted chart history of hoarseness. Lungs/Pleura: Diffuse airway cuffing. Interlobular septal thickening at the bases. Small right pleural effusion. Patchy atelectasis on both sides. Upper Abdomen: Bilateral renal cortical thinning. At least 2 bilateral renal calculi measuring up to 3 mm. There is an 11 mm right upper pole high-density nodule, partially covered and nonspecific Musculoskeletal: No acute or aggressive  finding. Other: Bilateral mastectomy. Review of the MIP images confirms the above findings. IMPRESSION: 1. CHF. 2. Negative for pulmonary embolism. 3. Partially covered 11 mm lesion in the right kidney which could be hemorrhagic cyst or solid. Could attempt characterization with elective renal ultrasound. MRI would be the most definitive. 4. Bilateral nephrolithiasis. Electronically Signed   By: Monte Fantasia M.D.   On: 07/03/2020 08:35   DG Chest Port 1 View  Result Date: 07/03/2020 CLINICAL DATA:  Shortness of breath, hypoxia, history of bilateral mastectomies. EXAM: PORTABLE CHEST 1 VIEW COMPARISON:  Radiograph 03/17/2020 FINDINGS: Increasing vascular congestion and cuffing with fairly symmetric hazy mid to lower lung and perihilar predominant opacity. No pneumothorax or visible effusion. The aorta is calcified. The remaining cardiomediastinal contours are unremarkable. Postsurgical changes from bilateral mastectomy and right axillary nodal dissection. No other acute or significant osseous or soft tissue abnormalities. Telemetry leads and support devices overlie the chest. IMPRESSION: Appearance suggestive of pulmonary edema though infection could at a similar appearance in the appropriate clinical setting. Aortic Atherosclerosis (ICD10-I70.0). Electronically Signed   By: Lovena Le M.D.   On: 07/03/2020 03:50   ECHOCARDIOGRAM COMPLETE  Result Date: 07/04/2020    ECHOCARDIOGRAM REPORT   Patient Name:   Megan [name] Date of Exam: 07/04/2020 Medical Rec #:  [id number]           Height:       62.0 in Accession #:    [id number]          Weight:       168.0 lb Date of Birth:  [birth year]           BSA:          1.775 m Patient Age:    6 years            BP:           155/83 mmHg Patient Gender: F                   HR:           90 bpm. Exam Location:  [name] Procedure: 2D Echo Indications:    CHF-Acute Diastolic 976.73 / A19.37  History:        Patient has prior history of Echocardiogram  examinations, most                 recent 02/03/2019. Signs/Symptoms:Chest Pain and Dyspnea; Risk                 Factors:Hypertension, Diabetes, Dyslipidemia and Non-Smoker.                 ESRD, Elevated Troponin, GERD, Left Mastectomy.  Sonographer:    Leavy Cella RDCS (AE) Referring Phys: [phone] DAVID TAT IMPRESSIONS  1. Left ventricular ejection fraction, by estimation, is 60 to 65%. The left ventricle has normal function. The left ventricle has no regional wall motion abnormalities. There is  mild left ventricular hypertrophy. Left ventricular diastolic parameters are consistent with Grade II diastolic dysfunction (pseudonormalization). Elevated left ventricular end-diastolic pressure.  2. Right ventricular systolic function is normal. The right ventricular size is normal.  3. Left atrial size was mildly dilated.  4. The mitral valve is abnormal. Mild mitral valve regurgitation. No evidence of mitral stenosis.  5. The aortic valve is tricuspid. Aortic valve regurgitation is not visualized. Mild aortic valve sclerosis is present, with no evidence of aortic valve stenosis.  6. The inferior vena cava is normal in size with <50% respiratory variability, suggesting right atrial pressure of 8 mmHg. FINDINGS  Left Ventricle: Left ventricular ejection fraction, by estimation, is 60 to 65%. The left ventricle has normal function. The left ventricle has no regional wall motion abnormalities. The left ventricular internal cavity size was normal in size. There is  mild left ventricular hypertrophy. Left ventricular diastolic parameters are consistent with Grade II diastolic dysfunction (pseudonormalization). Elevated left ventricular end-diastolic pressure. Right Ventricle: The right ventricular size is normal. No increase in right ventricular wall thickness. Right ventricular systolic function is normal. Left Atrium: Left atrial size was mildly dilated. Right Atrium: Right atrial size was normal in size. Pericardium:  There is no evidence of pericardial effusion. Mitral Valve: The mitral valve is abnormal. There is moderate thickening of the mitral valve leaflet(s). There is moderate calcification of the mitral valve leaflet(s). Mild mitral annular calcification. Mild mitral valve regurgitation. No evidence of mitral valve stenosis. Tricuspid Valve: The tricuspid valve is normal in structure. Tricuspid valve regurgitation is mild . No evidence of tricuspid stenosis. Aortic Valve: The aortic valve is tricuspid. Aortic valve regurgitation is not visualized. Mild aortic valve sclerosis is present, with no evidence of aortic valve stenosis. Pulmonic Valve: The pulmonic valve was normal in structure. Pulmonic valve regurgitation is not visualized. No evidence of pulmonic stenosis. Aorta: The aortic root is normal in size and structure. Venous: The inferior vena cava is normal in size with less than 50% respiratory variability, suggesting right atrial pressure of 8 mmHg. IAS/Shunts: No atrial level shunt detected by color flow Doppler.  LEFT VENTRICLE PLAX 2D LVIDd:         4.35 cm  Diastology LVIDs:         3.18 cm  LV e' medial:    6.32 cm/s LV PW:         1.40 cm  LV E/e' medial:  22.3 LV IVS:        1.29 cm  LV e' lateral:   8.11 cm/s LVOT diam:     1.90 cm  LV E/e' lateral: 17.4 LVOT Area:     2.84 cm  RIGHT VENTRICLE RV S prime:     11.20 cm/s TAPSE (M-mode): 1.7 cm LEFT ATRIUM             Index       RIGHT ATRIUM           Index LA diam:        4.10 cm 2.31 cm/m  RA Area:     15.90 cm LA Vol (A2C):   99.0 ml 55.77 ml/m RA Volume:   38.70 ml  21.80 ml/m LA Vol (A4C):   48.8 ml 27.49 ml/m LA Biplane Vol: 73.7 ml 41.52 ml/m   AORTA Ao Root diam: 2.80 cm MITRAL VALVE                TRICUSPID VALVE MV Area (PHT): 4.52 cm  TR Peak grad:   19.2 mmHg MV Decel Time: 168 msec     TR Vmax:        219.00 cm/s MV E velocity: 141.00 cm/s MV A velocity: 76.50 cm/s   SHUNTS MV E/A ratio:  1.84         Systemic Diam: 1.90 cm Jenkins Rouge MD Electronically signed by Jenkins Rouge MD Signature Date/Time: 07/04/2020/10:33:59 AM    Final      Discharge Exam: Vitals:   07/04/20 0111 07/04/20 0525  BP: (!) 150/72 (!) 155/83  Pulse: 95 90  Resp: 20 20  Temp: 99 F (37.2 C) 98.1 F (36.7 C)  SpO2: 93% 93%   Vitals:   07/03/20 2133 07/03/20 2326 07/04/20 0111 07/04/20 0525  BP: (!) 156/75 (!) 151/73 (!) 150/72 (!) 155/83  Pulse: (!) 101 (!) 101 95 90  Resp: 20 20 20 20   Temp: (!) 100.5 F (38.1 C) 98.2 F (36.8 C) 99 F (37.2 C) 98.1 F (36.7 C)  TempSrc: Oral Oral Oral Oral  SpO2: (!) 84% (!) 89% 93% 93%  Weight:      Height:        General: Pt is alert, awake, not in acute distress Cardiovascular: RRR, S1/S2 +, no rubs, no gallops Respiratory: CTA bilaterally, no wheezing, no rhonchi Abdominal: Soft, NT, ND, bowel sounds + Extremities: no edema, no cyanosis    The results of significant diagnostics from this hospitalization (including imaging, microbiology, ancillary and laboratory) are listed below for reference.     Microbiology: Recent Results (from the past 240 hour(s))  Resp Panel by RT-PCR (Flu A&B, Covid) Nasopharyngeal Swab     Status: None   Collection Time: 07/03/20  2:28 AM   Specimen: Nasopharyngeal Swab; Nasopharyngeal(NP) swabs in vial transport medium  Result Value Ref Range Status   SARS Coronavirus 2 by RT PCR NEGATIVE NEGATIVE Final    Comment: (NOTE) SARS-CoV-2 target nucleic acids are NOT DETECTED.  The SARS-CoV-2 RNA is generally detectable in upper respiratory specimens during the acute phase of infection. The lowest concentration of SARS-CoV-2 viral copies this assay can detect is 138 copies/mL. A negative result does not preclude SARS-Cov-2 infection and should not be used as the sole basis for treatment or other patient management decisions. A negative result may occur with  improper specimen collection/handling, submission of specimen other than nasopharyngeal swab,  presence of viral mutation(s) within the areas targeted by this assay, and inadequate number of viral copies(<138 copies/mL). A negative result must be combined with clinical observations, patient history, and epidemiological information. The expected result is Negative.  Fact Sheet for Patients:  EntrepreneurPulse.com.au  Fact Sheet for Healthcare Providers:  IncredibleEmployment.be  This test is no t yet approved or cleared by the [place] FDA and  has been authorized for detection and/or diagnosis of SARS-CoV-2 by FDA under an Emergency Use Authorization (EUA). This EUA will remain  in effect (meaning this test can be used) for the duration of the COVID-19 declaration under Section 564(b)(1) of the Act, 21 U.S.C.section 360bbb-3(b)(1), unless the authorization is terminated  or revoked sooner.       Influenza A by PCR NEGATIVE NEGATIVE Final   Influenza B by PCR NEGATIVE NEGATIVE Final    Comment: (NOTE) The Xpert Xpress SARS-CoV-2/FLU/RSV plus assay is intended as an aid in the diagnosis of influenza from Nasopharyngeal swab specimens and should not be used as a sole basis for treatment. Nasal washings and aspirates are unacceptable for Xpert Xpress  SARS-CoV-2/FLU/RSV testing.  Fact Sheet for Patients: EntrepreneurPulse.com.au  Fact Sheet for Healthcare Providers: IncredibleEmployment.be  This test is not yet approved or cleared by the [place] FDA and has been authorized for detection and/or diagnosis of SARS-CoV-2 by FDA under an Emergency Use Authorization (EUA). This EUA will remain in effect (meaning this test can be used) for the duration of the COVID-19 declaration under Section 564(b)(1) of the Act, 21 U.S.C. section 360bbb-3(b)(1), unless the authorization is terminated or revoked.  Performed at [facility], [address] [place], [place] [address]   MRSA PCR Screening      Status: None   Collection Time: 07/04/20  6:35 AM   Specimen: Nasopharyngeal  Result Value Ref Range Status   MRSA by PCR NEGATIVE NEGATIVE Final    Comment:        The GeneXpert MRSA Assay (FDA approved for NASAL specimens only), is one component of a comprehensive MRSA colonization surveillance program. It is not intended to diagnose MRSA infection nor to guide or monitor treatment for MRSA infections. Performed at [facility], [address] [place], [place] [address]      Labs: BNP (last 3 results) Recent Labs    07/03/20 0229  BNP 0,254.2*   Basic Metabolic Panel: Recent Labs  Lab 07/03/20 0229  NA 138  K 3.4*  CL 106  CO2 22  GLUCOSE 122*  BUN 23*  CREATININE 6.10*  CALCIUM 10.0   Liver Function Tests: Recent Labs  Lab 07/03/20 0229  AST 20  ALT 18  ALKPHOS 103  BILITOT 0.6  PROT 8.1  ALBUMIN 3.9   No results for input(s): LIPASE, AMYLASE in the last 168 hours. No results for input(s): AMMONIA in the last 168 hours. CBC: Recent Labs  Lab 07/03/20 0229 07/04/20 0532  WBC 15.2* 10.2  NEUTROABS 12.9*  --   HGB 10.5* 8.9*  HCT 33.7* 28.9*  MCV 98.5 100.3*  PLT 368 299   Cardiac Enzymes: No results for input(s): CKTOTAL, CKMB, CKMBINDEX, TROPONINI in the last 168 hours. BNP: Invalid input(s): POCBNP CBG: No results for input(s): GLUCAP in the last 168 hours. D-Dimer Recent Labs    07/03/20 0229  DDIMER 1.36*   Hgb A1c No results for input(s): HGBA1C in the last 72 hours. Lipid Profile No results for input(s): CHOL, HDL, LDLCALC, TRIG, CHOLHDL, LDLDIRECT in the last 72 hours. Thyroid function studies No results for input(s): TSH, T4TOTAL, T3FREE, THYROIDAB in the last 72 hours.  Invalid input(s): FREET3 Anemia work up No results for input(s): VITAMINB12, FOLATE, FERRITIN, TIBC, IRON, RETICCTPCT in the last 72 hours. Urinalysis    Component Value Date/Time   COLORURINE YELLOW 10/03/2016 0704   APPEARANCEUR CLEAR 10/03/2016 0704    LABSPEC 1.010 10/03/2016 0704   PHURINE 6.0 10/03/2016 0704   GLUCOSEU 50 (A) 10/03/2016 0704   HGBUR NEGATIVE 10/03/2016 0704   BILIRUBINUR NEGATIVE 10/03/2016 0704   KETONESUR NEGATIVE 10/03/2016 0704   PROTEINUR 100 (A) 10/03/2016 0704   UROBILINOGEN 0.2 08/19/2011 1103   NITRITE NEGATIVE 10/03/2016 0704   LEUKOCYTESUR NEGATIVE 10/03/2016 0704   Sepsis Labs Invalid input(s): PROCALCITONIN,  WBC,  LACTICIDVEN Microbiology Recent Results (from the past 240 hour(s))  Resp Panel by RT-PCR (Flu A&B, Covid) Nasopharyngeal Swab     Status: None   Collection Time: 07/03/20  2:28 AM   Specimen: Nasopharyngeal Swab; Nasopharyngeal(NP) swabs in vial transport medium  Result Value Ref Range Status   SARS Coronavirus 2 by RT PCR NEGATIVE NEGATIVE Final  Comment: (NOTE) SARS-CoV-2 target nucleic acids are NOT DETECTED.  The SARS-CoV-2 RNA is generally detectable in upper respiratory specimens during the acute phase of infection. The lowest concentration of SARS-CoV-2 viral copies this assay can detect is 138 copies/mL. A negative result does not preclude SARS-Cov-2 infection and should not be used as the sole basis for treatment or other patient management decisions. A negative result may occur with  improper specimen collection/handling, submission of specimen other than nasopharyngeal swab, presence of viral mutation(s) within the areas targeted by this assay, and inadequate number of viral copies(<138 copies/mL). A negative result must be combined with clinical observations, patient history, and epidemiological information. The expected result is Negative.  Fact Sheet for Patients:  EntrepreneurPulse.com.au  Fact Sheet for Healthcare Providers:  IncredibleEmployment.be  This test is no t yet approved or cleared by the [place] FDA and  has been authorized for detection and/or diagnosis of SARS-CoV-2 by FDA under an Emergency Use  Authorization (EUA). This EUA will remain  in effect (meaning this test can be used) for the duration of the COVID-19 declaration under Section 564(b)(1) of the Act, 21 U.S.C.section 360bbb-3(b)(1), unless the authorization is terminated  or revoked sooner.       Influenza A by PCR NEGATIVE NEGATIVE Final   Influenza B by PCR NEGATIVE NEGATIVE Final    Comment: (NOTE) The Xpert Xpress SARS-CoV-2/FLU/RSV plus assay is intended as an aid in the diagnosis of influenza from Nasopharyngeal swab specimens and should not be used as a sole basis for treatment. Nasal washings and aspirates are unacceptable for Xpert Xpress SARS-CoV-2/FLU/RSV testing.  Fact Sheet for Patients: EntrepreneurPulse.com.au  Fact Sheet for Healthcare Providers: IncredibleEmployment.be  This test is not yet approved or cleared by the [place] FDA and has been authorized for detection and/or diagnosis of SARS-CoV-2 by FDA under an Emergency Use Authorization (EUA). This EUA will remain in effect (meaning this test can be used) for the duration of the COVID-19 declaration under Section 564(b)(1) of the Act, 21 U.S.C. section 360bbb-3(b)(1), unless the authorization is terminated or revoked.  Performed at [facility], [address] [place], [place] [address]   MRSA PCR Screening     Status: None   Collection Time: 07/04/20  6:35 AM   Specimen: Nasopharyngeal  Result Value Ref Range Status   MRSA by PCR NEGATIVE NEGATIVE Final    Comment:        The GeneXpert MRSA Assay (FDA approved for NASAL specimens only), is one component of a comprehensive MRSA colonization surveillance program. It is not intended to diagnose MRSA infection nor to guide or monitor treatment for MRSA infections. Performed at [facility], [address], [place], [place] [address]      Time coordinating discharge: 35 minutes  SIGNED:   Rodena Goldmann, DO [facility] 07/04/2020, 10:48 AM  If 7PM-7AM, please contact night-coverage [email or web address]

## 2020-07-04 NOTE — Procedures (Signed)
HEMODIALYSIS TREATMENT NOTE:  Uneventful 3.5 hour heparin-free HD completed via LUE AVF (15g/antegrade). Goal met: 3 liters removed; no interruption in UF.  All blood was returned and hemostasis was achieved in 10 minutes.  No changes from pre-dialysis assessment.   , RN 

## 2020-07-04 NOTE — Consult Note (Signed)
Referring Provider: No ref. provider found Primary Care Physician:  [name], MD Primary Nephrologist:     Reason for Consultation: Medical management end-stage renal disease, maintenance euvolemia, evaluation and treatment of electrolyte abnormalities, evaluation and treatment of acid-base, assessment and treatment of anemia, assessment treatment of secondary hyperparathyroidism.  HPI: This is a 59 year old lady with history of hypertension diabetes mellitus type 2 history of breast cancer recurrence in right breast and end-stage renal disease Monday Wednesday Friday [facility].  She presented with increasing dyspnea 07/03/2019.  It appears that she has been losing weight and has had a dry weight challenge.  She had increased interstitial markings with right > left lower lobe opacity on chest x-ray.  CTA chest was negative for pulmonary embolus.  She was admitted with a diagnosis of pulmonary edema and a pneumonia.  We will dialyze 07/05/2019  Blood pressure 155/83 pulse 90 temperature 98.1 O2 sats 93% 2 L nasal cannula  Sodium 138 potassium 3.4 chloride 106 CO2 22 BUN 623 creatinine 6.1 glucose 122 hemoglobin 8.9.   Home medications: Amlodipine 10 mg nightly, Arimidex 1 mg daily, atorvastatin 20 mg nightly, cinacalcet 30 mg daily, Cymbalta 30 mg daily, Neurontin 300 mg nightly, Fosrenol 2 to 3 g with meals, Bystolic 10 mg nightly, Protonix 40 mg twice daily.   Outpatient dialysis: MWF   [facility] T 3: 30  EDW 76 kg   2K  Optiflux 180 NR   Ca 2.5     Darbepoetin 100 mcg  Weekly  Last dose 11/17  Iron  50 mcg weekly Calcitriol 1 mcg with HD  Past Medical History:  Diagnosis Date  . (HFpEF) heart failure with preserved ejection fraction ([place])    a. 01/2019 Echo: EF 55-60%, mild conc LVH. DD.  Torn MV chordae.  . Anemia   . Atypical chest pain    a. 08/2018 MV: EF 59%, no ischemia; b. 02/2019 Cath: nonobs dzs.  . Blood transfusion without reported diagnosis   . Cataract   .  ESRD (end stage renal disease) on dialysis ([place])    a. HD T, T, S  . Essential hypertension, benign   . GERD (gastroesophageal reflux disease)   . Headache   . Hemorrhoids   . Mixed hyperlipidemia   . Morbid obesity ([place])   . Non-obstructive CAD (coronary artery disease)    a. 02/2019 CathL LM nl, LAD 61m, LCX nl, RCA 25p, EF 55-65%.  Marland Kitchen PONV (postoperative nausea and vomiting)   . S/P colonoscopy Jan 2011   Dr. Benson Norway: sessile polyp (benign lymphoid), large hemorrhoids, repeat 5-10 years  . Temporal arteritis ([place])   . Type 2 diabetes mellitus ([place])   . Wears glasses     Past Surgical History:  Procedure Laterality Date  . ABDOMINAL HYSTERECTOMY    . APPENDECTOMY    . ARTERY BIOPSY N/A 05/09/2018   Procedure: RIGHT TEMPORAL ARTERY BIOPSY;  Surgeon: Judeth Horn, MD;  Location: [facility];  Service: General;  Laterality: N/A;  . BREAST BIOPSY Right 06/15/2020   Procedure: RIGHT BREAST BIOPSY;  Surgeon: Aviva Signs, MD;  Location: [facility];  Service: General;  Laterality: Right;  . CATARACT EXTRACTION W/PHACO Left 02/09/2017   Procedure: CATARACT EXTRACTION PHACO AND INTRAOCULAR LENS PLACEMENT LEFT EYE;  Surgeon: Tonny Branch, MD;  Location: [facility];  Service: Ophthalmology;  Laterality: Left;  CDE: 4.89  . CATARACT EXTRACTION W/PHACO Right 06/04/2017   Procedure: CATARACT EXTRACTION PHACO AND INTRAOCULAR LENS PLACEMENT (IOC);  Surgeon: Tonny Branch, MD;  Location: [facility];  Service: Ophthalmology;  Laterality: Right;  CDE: 4.12  . CHOLECYSTECTOMY  09/29/2011   Procedure: LAPAROSCOPIC CHOLECYSTECTOMY;  Surgeon: Jamesetta So, MD;  Location: [facility];  Service: General;  Laterality: N/A;  . COLONOSCOPY  Jan 2011   Dr. Benson Norway: sessile polyp (benign lymphoid), large hemorrhoids, repeat 5-10 years  . COLONOSCOPY N/A 06/12/2016   Procedure: COLONOSCOPY;  Surgeon: Daneil Dolin, MD;  [facility];  Service: Endoscopy;  Laterality: N/A;  1230   . ESOPHAGOGASTRODUODENOSCOPY  09/05/2011    EXH:BZJIR hiatal hernia; remainder of exam normal. No explanation for patient's abdominal pain with today's examination  . ESOPHAGOGASTRODUODENOSCOPY N/A 12/17/2013   Dr. [name]: gastric erythema, erosion, mild chronic inflammation on path   . LAPAROSCOPIC APPENDECTOMY  09/29/2011   Procedure: APPENDECTOMY LAPAROSCOPIC;  Surgeon: Jamesetta So, MD;  Location: [facility];  Service: General;;  incidental appendectomy  . LEFT HEART CATH AND CORONARY ANGIOGRAPHY N/A 02/28/2019   Procedure: LEFT HEART CATH AND CORONARY ANGIOGRAPHY;  Surgeon: Jettie Booze, MD;  Location: [facility];  Service: Cardiovascular;  Laterality: N/A;  . MASTECTOMY MODIFIED RADICAL Right 02/18/2020   Procedure: MASTECTOMY MODIFIED RADICAL;  Surgeon: Aviva Signs, MD;  Location[facility];  Service: General;  Laterality: Right;  . PARTIAL MASTECTOMY WITH NEEDLE LOCALIZATION AND AXILLARY SENTINEL LYMPH NODE BX Right 09/18/2018   Procedure: RIGHT PARTIAL MASTECTOMY AFTER NEEDLE LOCALIZATION, SENTINEL LYMPH NODE BIOPSY RIGHT AXILLA;  Surgeon: Aviva Signs, MD;  Location: [facility];  Service: General;  Laterality: Right;  . SIMPLE MASTECTOMY WITH AXILLARY SENTINEL NODE BIOPSY Left 06/15/2020   Procedure: LEFT SIMPLE MASTECTOMY;  Surgeon: Aviva Signs, MD;  Location[facility];  Service: General;  Laterality: Left;    Prior to Admission medications   Medication Sig Start Date End Date Taking? Authorizing Provider  amLODipine (NORVASC) 10 MG tablet Take 1 tablet (10 mg total) by mouth at bedtime. Patient taking differently: Take 10 mg by mouth in the morning and at bedtime.  04/29/20  Yes [name], MD  anastrozole (ARIMIDEX) 1 MG tablet TAKE 1 TABLET(1 MG) BY MOUTH DAILY Patient taking differently: Take 1 mg by mouth daily.  04/29/20  Yes Lockamy, Randi L, NP-C  aspirin EC 81 MG tablet Take 81 mg by mouth daily.   Yes [provider]  atorvastatin (LIPITOR) 20 MG tablet Take 1 tablet (20 mg total) by mouth  daily. Patient taking differently: Take 20 mg by mouth at bedtime.  06/29/20  Yes [name], MD  B Complex-C-Zn-Folic Acid (DIALYVITE 678-LFYB 15) 0.8 MG TABS Take 1 tablet by mouth daily. 12/10/19  Yes [provider]  cinacalcet (SENSIPAR) 30 MG tablet Take 2 tablets (60 mg total) by mouth daily. Patient taking differently: Take 30 mg by mouth daily with supper.  06/13/18  Yes [name], MD  DULoxetine (CYMBALTA) 30 MG capsule TAKE 1 CAPSULE(30 MG) BY MOUTH DAILY Patient taking differently: Take 30 mg by mouth daily.  02/05/20  Yes [name], MD  gabapentin (NEURONTIN) 300 MG capsule TAKE 1 CAPSULE(300 MG) BY MOUTH AT BEDTIME Patient taking differently: Take 300 mg by mouth at bedtime.  06/15/20  Yes [name], MD  HYDROcodone-acetaminophen (NORCO) 5-325 MG tablet Take 1 tablet by mouth every 6 (six) hours as needed for moderate pain. 06/16/20  Yes Aviva Signs, MD  lanthanum (FOSRENOL) 1000 MG chewable tablet Chew 2,000-3,000 mg by mouth See admin instructions. Take 3000 mg by mouth with  meals and 2000 mg with snacks 10/16/19  Yes [provider]  lidocaine-prilocaine (EMLA) cream Apply 1 application topically every Monday, Wednesday, and Friday with hemodialysis.  09/24/18  Yes [provider]  nebivolol (BYSTOLIC) 10 MG tablet Take 1 tablet (10 mg total) by mouth in the morning and at bedtime. 05/13/20  Yes [name], MD  pantoprazole (PROTONIX) 40 MG tablet TAKE 1 TABLET(40 MG) BY MOUTH DAILY Patient taking differently: Take 40 mg by mouth daily.  06/02/19  Yes Roger Shelter, FNP  benzonatate (TESSALON) 200 MG capsule Take 1 capsule (200 mg total) by mouth 2 (two) times daily as needed for cough. Patient not taking: Reported on 06/30/2020 03/22/20   [name], MD    Current Facility-Administered Medications  Medication Dose Route Frequency Provider Last Rate Last Admin  . acetaminophen (TYLENOL) tablet 650 mg  650 mg Oral  Q6H PRN Tat, David, MD       Or  . acetaminophen (TYLENOL) suppository 650 mg  650 mg Rectal Q6H PRN Tat, Shanon Brow, MD      . amLODipine (NORVASC) tablet 10 mg  10 mg Oral Benay Pike, MD   10 mg at 07/03/20 2136  . anastrozole (ARIMIDEX) tablet 1 mg  1 mg Oral Daily Tat, David, MD      . atorvastatin (LIPITOR) tablet 20 mg  20 mg Oral Benay Pike, MD   20 mg at 07/03/20 2136  . ceFEPIme (MAXIPIME) 1 g in sodium chloride 0.9 % 100 mL IVPB  1 g Intravenous Q24H Coffee, Garrett, RPH 200 mL/hr at 07/03/20 1313 1 g at 07/03/20 1313  . Chlorhexidine Gluconate Cloth 2 % PADS 6 each  6 each Topical Q0600 Edrick Oh, MD      . cinacalcet [facility]) tablet 30 mg  30 mg Oral Q supper Tat, Shanon Brow, MD   30 mg at 07/03/20 1725  . DULoxetine (CYMBALTA) DR capsule 30 mg  30 mg Oral Daily Tat, David, MD   30 mg at 07/03/20 1725  . feeding supplement (NEPRO CARB STEADY) liquid 237 mL  237 mL Oral BID BM Tat, David, MD      . gabapentin (NEURONTIN) capsule 300 mg  300 mg Oral QHS Orson Eva, MD   300 mg at 07/03/20 2136  . heparin injection 5,000 Units  5,000 Units Subcutaneous Franco Collet, MD   5,000 Units at 07/04/20 8416  . hydrALAZINE (APRESOLINE) injection 10 mg  10 mg Intravenous Q6H PRN Tat, Shanon Brow, MD      . lanthanum (FOSRENOL) chewable tablet 2,000 mg  2,000 mg Oral With snacks Tat, David, MD      . lanthanum [facility]) chewable tablet 3,000 mg  3,000 mg Oral TID WC Tat, Shanon Brow, MD      . multivitamin (RENA-VIT) tablet 1 tablet  1 tablet Oral Benay Pike, MD   1 tablet at 07/03/20 2136  . nebivolol (BYSTOLIC) tablet 10 mg  10 mg Oral BID Orson Eva, MD   10 mg at 07/03/20 2136  . ondansetron (ZOFRAN) tablet 4 mg  4 mg Oral Q6H PRN Tat, David, MD       Or  . ondansetron (ZOFRAN) injection 4 mg  4 mg Intravenous Q6H PRN Tat, David, MD      . pantoprazole (PROTONIX) EC tablet 40 mg  40 mg Oral BID Orson Eva, MD   40 mg at 07/03/20 2136  . [START ON 07/05/2020] vancomycin (VANCOCIN) IVPB 750  mg/150 ml premix  750 mg Intravenous Q M,W,F-HD Coffee, Donna Christen, RPH        Allergies as of 07/03/2020  . (No Known Allergies)    Family History  Problem Relation Age of Onset  . Hypertension Mother   . Coronary artery disease Mother   . Diabetes Mother   . Hypertension Sister   . Coronary artery disease Sister   . Hypertension Brother   . Heart attack Father   . Hypertension Son   . Heart attack Maternal Aunt   . Hypertension Maternal Aunt   . Diabetes Maternal Aunt   . Heart attack Maternal Uncle   . Hypertension Maternal Uncle   . Diabetes Maternal Uncle   . Heart attack Paternal Aunt   . Hypertension Paternal Aunt   . Diabetes Paternal Aunt   . Heart attack Paternal Uncle   . Hypertension Paternal Uncle   . Diabetes Paternal Uncle   . Heart attack Maternal Grandmother   . Heart attack Maternal Grandfather   . Heart attack Paternal Grandmother   . Heart attack Paternal Grandfather   . Colon cancer Neg Hx     Social History   Socioeconomic History  . Marital status: Married    Spouse name: Not on file  . Number of children: Not on file  . Years of education: Not on file  . Highest education level: Not on file  Occupational History  . 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[employer]  Tobacco Use  . Smoking status: Never Smoker  . Smokeless tobacco: Never Used  Vaping Use  . Vaping Use: Never used  Substance and Sexual Activity  . Alcohol use: No  . Drug use: No  . Sexual activity: Yes    Birth control/protection: Surgical  Other Topics Concern  . Not on file  Social History Narrative   Works at [employer] in [place].    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   Also has to get items from high shelves-causes achy pain in shoulder area      Married.   Children are grown, out of house.   Social Determinants of Health   Financial Resource Strain:   . Difficulty of Paying Living Expenses: Not on file  Food Insecurity:   . Worried  About Charity fundraiser in the Last Year: Not on file  . Ran Out of Food in the Last Year: Not on file  Transportation Needs:   . Lack of Transportation (Medical): Not on file  . Lack of Transportation (Non-Medical): Not on file  Physical Activity:   . Days of Exercise per Week: Not on file  . Minutes of Exercise per Session: Not on file  Stress:   . Feeling of Stress : Not on file  Social Connections:   . Frequency of Communication with Friends and Family: Not on file  . Frequency of Social Gatherings with Friends and Family: Not on file  . Attends Religious Services: Not on file  . Active Member of Clubs or Organizations: Not on file  . Attends Archivist Meetings: Not on file  . Marital Status: Not on file  Intimate Partner Violence:   . Fear of Current or Ex-Partner: Not on file  . Emotionally Abused: Not on file  . Physically Abused: Not on file  . Sexually Abused: Not on file    Review of Systems: Gen: Fatigue and weakness no fever sweats or chills HEENT: No visual complaints,  No history of Retinopathy. Normal external appearance No Epistaxis or Sore throat. No sinusitis.   CV: Denies chest pain, angina, palpitations, syncope, orthopnea, PND, peripheral edema, and claudication. Resp: Increasing cough and shortness of breath GI: Denies vomiting blood, jaundice, and fecal incontinence.   Denies dysphagia or odynophagia. GU : Anuric end-stage renal disease MS: Denies joint pain, limitation of movement, and swelling, stiffness, low back pain, extremity pain. Denies muscle weakness, cramps, atrophy.  No use of non steroidal antiinflammatory drugs. Derm: Denies rash, itching, dry skin, hives, moles, warts, or unhealing ulcers.  Psych: Denies depression, anxiety, memory loss, suicidal ideation, hallucinations, paranoia, and confusion. Heme: History of recurrent breast cancer Neuro: No headache.  No diplopia. No dysarthria.  No dysphasia.  No history of CVA.  No Seizures.  No paresthesias.  No weakness. Endocrine no history of diabetes  Physical Exam: Vital signs in last 24 hours: Temp:  [98.1 F (36.7 C)-100.5 F (38.1 C)] 98.1 F (36.7 C) (11/21 0525) Pulse Rate:  [85-101] 90 (11/21 0525) Resp:  [16-35] 20 (11/21 0525) BP: (150-188)/(72-96) 155/83 (11/21 0525) SpO2:  [84 %-99 %] 93 % (11/21 0525) Last BM Date: 07/03/20 General: Chronically ill-appearing lady. Head:  Normocephalic and atraumatic. Eyes:  Sclera clear, no icterus.   Conjunctiva pink. Ears:  Normal auditory acuity. Nose:  No deformity, discharge,  or lesions. Mouth:  No deformity or lesions, dentition normal. Neck:  Supple; no masses or thyromegaly.  Mildly elevated JVP Lungs: Diminished breath sounds throughout.  Occasional lower lobe crackles Heart:  Regular rate and rhythm; no murmurs, clicks, rubs,  or gallops. Abdomen:  Soft, nontender and nondistended. No masses, hepatosplenomegaly or hernias noted. Normal bowel sounds, without guarding, and without rebound.   Msk:  Symmetrical without gross deformities. Normal posture. Pulses:  No carotid, renal, femoral bruits. DP and PT symmetrical and equal Extremities:  Without clubbing or edema. Neurologic:  Alert and  oriented x4;  grossly normal neurologically.   Intake/Output from previous day: 11/20 0701 - 11/21 0700 In: 447.2 [IV Piggyback:447.2] Out: -  Intake/Output this shift: No intake/output data recorded.  Lab Results: Recent Labs    07/03/20 0229 07/04/20 0532  WBC 15.2* 10.2  HGB 10.5* 8.9*  HCT 33.7* 28.9*  PLT 368 299   BMET Recent Labs    07/03/20 0229  NA 138  K 3.4*  CL 106  CO2 22  GLUCOSE 122*  BUN 23*  CREATININE 6.10*  CALCIUM 10.0   LFT Recent Labs    07/03/20 0229  PROT 8.1  ALBUMIN 3.9  AST 20  ALT 18  ALKPHOS 103  BILITOT 0.6   PT/INR No results for input(s): LABPROT, INR in the last 72 hours. Hepatitis Panel No results for input(s): HEPBSAG, HCVAB, HEPAIGM, HEPBIGM in the  last 72 hours.  Studies/Results: CT Angio Chest PE W/Cm &/Or Wo Cm  Result Date: 07/03/2020 CLINICAL DATA:  Shortness of breath with exertion today. EXAM: CT ANGIOGRAPHY CHEST WITH CONTRAST TECHNIQUE: Multidetector CT imaging of the chest was performed using the standard protocol during bolus administration of intravenous contrast. Multiplanar CT image reconstructions and MIPs were obtained to evaluate the vascular anatomy. CONTRAST:  144mL OMNIPAQUE IOHEXOL 350 MG/ML SOLN COMPARISON:  02/14/2019 FINDINGS: Cardiovascular: Satisfactory opacification of the pulmonary arteries to the segmental level. No evidence of pulmonary embolism. Enlarged heart size. No pericardial effusion. Aortic and coronary calcifications Mediastinum/Nodes: Prominent right piriform sinus which could be from incomplete coverage. The glottis itself appears symmetric. No noted chart history of hoarseness.  Lungs/Pleura: Diffuse airway cuffing. Interlobular septal thickening at the bases. Small right pleural effusion. Patchy atelectasis on both sides. Upper Abdomen: Bilateral renal cortical thinning. At least 2 bilateral renal calculi measuring up to 3 mm. There is an 11 mm right upper pole high-density nodule, partially covered and nonspecific Musculoskeletal: No acute or aggressive finding. Other: Bilateral mastectomy. Review of the MIP images confirms the above findings. IMPRESSION: 1. CHF. 2. Negative for pulmonary embolism. 3. Partially covered 11 mm lesion in the right kidney which could be hemorrhagic cyst or solid. Could attempt characterization with elective renal ultrasound. MRI would be the most definitive. 4. Bilateral nephrolithiasis. Electronically Signed   By: Monte Fantasia M.D.   On: 07/03/2020 08:35   DG Chest Port 1 View  Result Date: 07/03/2020 CLINICAL DATA:  Shortness of breath, hypoxia, history of bilateral mastectomies. EXAM: PORTABLE CHEST 1 VIEW COMPARISON:  Radiograph 03/17/2020 FINDINGS: Increasing vascular  congestion and cuffing with fairly symmetric hazy mid to lower lung and perihilar predominant opacity. No pneumothorax or visible effusion. The aorta is calcified. The remaining cardiomediastinal contours are unremarkable. Postsurgical changes from bilateral mastectomy and right axillary nodal dissection. No other acute or significant osseous or soft tissue abnormalities. Telemetry leads and support devices overlie the chest. IMPRESSION: Appearance suggestive of pulmonary edema though infection could at a similar appearance in the appropriate clinical setting. Aortic Atherosclerosis (ICD10-I70.0). Electronically Signed   By: Lovena Le M.D.   On: 07/03/2020 03:50     Assessment/Plan:  ESRD-[facility] Monday Wednesday Friday schedule with dialyze 07/04/2020.   ANEMIA-usually receives erythropoietin with dialysis.  100 mcg of darbepoetin weekly last dose was 06/30/2020.  She appears a little more anemic during hospitalization we will check iron studies.  MBD-calcitriol 1 mcg with dialysis and Sensipar 30 mg daily  HTN/VOL-we will challenge estimated dry weight.  ACCESS-left forearm AV fistula  Pneumonia -antibiotics, oxygen, nebulizers as per primary service   LOS: [place] @TODAY @8 :01 AM

## 2020-07-05 NOTE — Patient Instructions (Signed)
Megan [name]  07/05/2020     @PREFPERIOPPHARMACY @   Your procedure is scheduled on 07/13/2020  Report to [name] at  [email or web address].  Call this number if you have problems the morning of surgery:  [phone]   Remember:  Do not eat or drink after midnight.                        Take these medicines the morning of surgery with A SIP OF WATER  Amlodipine, arimidex, cymbalta, hydrocodone(if needed), bystolic.    Do not wear jewelry, make-up or nail polish.  Do not wear lotions, powders, or perfumes or deodorant. Please brush your teeth.  Do not shave 48 hours prior to surgery.  Men may shave face and neck.  Do not bring valuables to the hospital.  [facility] is not responsible for any belongings or valuables.  Contacts, dentures or bridgework may not be worn into surgery.  Leave your suitcase in the car.  After surgery it may be brought to your room.  For patients admitted to the hospital, discharge time will be determined by your treatment team.  Patients discharged the day of surgery will not be allowed to drive home.   Name and phone number of your driver:   family Special instructions:  DO NOT smoke the morning of your procedure.  Please read over the following fact sheets that you were given. Anesthesia Post-op Instructions and Care and Recovery After Surgery       Prophylactic Mastectomy Information Prophylactic mastectomy is surgery to remove breast tissue in order to prevent breast cancer. This is also called breast cancer risk-reducing surgery. Usually, both breasts are removed in this procedure, but it may also be done to remove the remaining breast if you have previously had one breast removed because of breast cancer. This surgery may reduce your risk of getting breast cancer by 90 to 95 percent. Who should consider having this surgery? You may want to consider this surgery if:  You have a strong family history of breast cancer. This  means that you have two family members with a history of breast cancer and at least one is your mother, sister, or daughter.  You have been tested and found out that you have a gene that increases your risk for breast cancer. These genes include: ? BRCA1 or BRCA2. ? CDH1. ? TP53. ? PALB2. ? PTEN. ? Other genes are being studied that may increase a person's risk for breast cancer.  You have had cancer in one breast and want to take precautions to prevent cancer in your remaining breast.  You have been diagnosed with a condition called lobular carcinoma in situ (LCIS) in addition to having another risk factor. This condition has a high risk of becoming an aggressive (invasive) cancer in the future.  You had radiation treatments to your chest before age 57. This may increase your risk for breast cancer.  You have tiny calcium growths in your breasts (microcalcifications). These calcifications may indicate cancer. If they are spread throughout your breasts, you may need multiple biopsies. This can lead to scar tissue. The combination of scar tissue and calcifications can make it very difficult to diagnose breast cancer. Having a prophylactic mastectomy is a big decision. Discuss your risk for breast cancer with your health care provider before deciding to have this surgery. Be sure to discuss your options for surgery  and consider whether you want to have reconstructive breast surgery. Reconstruction may be done at the time of the mastectomy surgery or at a later date. What are the types of prophylactic mastectomy? The main types of prophylactic mastectomy include:  Simple or total mastectomy. All breast tissue, along with the skin and nipple, are removed. The lymph nodes and the muscles of your chest are left in place.  Skin-sparing mastectomy. The breast tissue and the nipple are removed, but the breast skin is left in place for reconstruction.  Nipple-sparing mastectomy. The breast tissue is  removed without removing any of the breast skin, areola, or nipple. This procedure allows the breast to be reconstructed in a way that looks more like your natural breast. What are the risks? When considering the benefits of breast cancer prevention surgery, you also need to discuss the risks of surgery with your health care provider. The risks include:  Risks of the surgical procedure, such as infection or bleeding.  The appearance of scar tissue and the loss of breast sensation after reconstruction.  The psychological impact of surgery, including how the surgery may affect your self-image and sexuality.  The risk of getting breast cancer, even after surgery. What should I do before deciding to have this surgery?   Take the time to do research before making the decision to have this procedure. Make sure you understand your risk for breast cancer, your options for surgery or reconstruction, and possible risks of the surgery.  Consider getting a second opinion before deciding on surgery. Make sure you understand all of your options.  Talk with others who have had a prophylactic mastectomy.  Ask your health care provider about other options for reducing your risk for breast cancer. These may include: ? Taking medicines that block the female hormone that may stimulate breast cancers (estrogen). ? Having breast cancer screenings more often and including an MRI along with your mammograms. Where to find more information  Breastcancer.org: www.breastcancer.[place]: ww5.komen.[place]: www.cancer.gov Summary  Prophylactic mastectomy is surgery to remove breast tissue in order to prevent breast cancer.  You may want to consider this surgery if you are at high risk for breast cancer.  There are several options for surgery and breast reconstruction.  Discuss the risks of surgery with your health care provider and research all your options before  deciding to have this procedure. Consider both the short-term and long-term risks of surgery. This information is not intended to replace advice given to you by your health care provider. Make sure you discuss any questions you have with your health care provider. Document Revised: 10/04/2018 Document Reviewed: 04/04/2018 Elsevier Patient Education  2020 [place].  Breast Biopsy, Care After These instructions give you information about caring for yourself after your procedure. Your doctor may also give you more specific instructions. Call your doctor if you have any problems or questions after your procedure. What can I expect after the procedure? After your procedure, it is common to have:  Bruising on your breast.  Numbness, tingling, or pain near your biopsy site. Follow these instructions at home: Medicines  Take over-the-counter and prescription medicines only as told by your doctor.  Do not drive for 24 hours if you were given a medicine to help you relax (sedative) during your procedure.  Do not drink alcohol while taking pain medicine.  Do not drive or use heavy machinery while taking prescription pain medicine. Biopsy site care  Follow instructions from your doctor about how to take care of your cut from surgery (incision) or your puncture area. Make sure you: ? Wash your hands with soap and water before you change your bandage (dressing). If you cannot use soap and water, use hand sanitizer. ? Change your bandage as told by your doctor. ? Leave stitches (sutures), skin glue, or skin tape (adhesive strips) in place. They may need to stay in place for 2 weeks or longer. If tape strips get loose and curl up, you may trim the loose edges. Do not remove tape strips completely unless your doctor says it is okay.  If you have stitches, keep them dry when you take a bath or a shower.  Check your cut or puncture area every day for signs of infection. Check  for: ? Redness, swelling, or pain. ? Fluid or blood. ? Warmth. ? Pus or a bad smell.  Protect the biopsy area. Do not let the area get bumped. Activity  If you had a cut during your procedure, avoid activities that could pull your cut open. These include: ? Stretching. ? Reaching over your head. ? Exercise. ? Sports. ? Lifting anything that weighs more than 3 lb (1.4 kg).  Return to your normal activities as told by your doctor. Ask your doctor what activities are safe for you. Managing pain, stiffness, and swelling If told, put ice on the biopsy site to relieve swelling:  Put ice in a plastic bag.  Place a towel between your skin and the bag.  Leave the ice on for 20 minutes, 2-3 times a day. General instructions  Continue your normal diet.  Wear a good support bra for as long as told by your doctor.  Get checked for extra fluid around your lymph nodes (lymphedema) as often as told by your doctor.  Keep all follow-up visits as told by your doctor. This is important. Contact a doctor if:  You notice any of the following at the biopsy site: ? More redness, swelling, or pain. ? More fluid or blood coming from the site. ? The site feels warm to the touch. ? Pus or a bad smell coming from the site. ? The site breaks open after the stitches or skin tape strips have been removed.  You have a rash.  You have a fever. Get help right away if:  You have more bleeding from the biopsy site. Get help right away if bleeding is more than a small spot.  You have trouble breathing.  You have red streaks around the biopsy site. Summary  After your procedure, it is common to have bruising, numbness, tingling, or pain near the biopsy site.  Do not drive or use heavy machinery while taking prescription pain medicine.  Wear a good support bra for as long as told by your doctor.  If you had a cut during your procedure, avoid activities that may pull the cut open. Ask your doctor  what activities are safe for you. This information is not intended to replace advice given to you by your health care provider. Make sure you discuss any questions you have with your health care provider. Document Revised: 01/17/2018 Document Reviewed: 01/17/2018 Elsevier Patient Education  [place] After These instructions provide you with information about caring for yourself after your procedure. Your health care provider may also give you more specific instructions. Your treatment has been planned according to current medical practices, but problems sometimes occur.  Call your health care provider if you have any problems or questions after your procedure. What can I expect after the procedure? After your procedure, you may:  Feel sleepy for several hours.  Feel clumsy and have poor balance for several hours.  Feel forgetful about what happened after the procedure.  Have poor judgment for several hours.  Feel nauseous or vomit.  Have a sore throat if you had a breathing tube during the procedure. Follow these instructions at home: For at least 24 hours after the procedure:      Have a responsible adult stay with you. It is important to have someone help care for you until you are awake and alert.  Rest as needed.  Do not: ? Participate in activities in which you could fall or become injured. ? Drive. ? Use heavy machinery. ? Drink alcohol. ? Take sleeping pills or medicines that cause drowsiness. ? Make important decisions or sign legal documents. ? Take care of children on your own. Eating and drinking  Follow the diet that is recommended by your health care provider.  If you vomit, drink water, juice, or soup when you can drink without vomiting.  Make sure you have little or no nausea before eating solid foods. General instructions  Take over-the-counter and prescription medicines only as told by your health care  provider.  If you have sleep apnea, surgery and certain medicines can increase your risk for breathing problems. Follow instructions from your health care provider about wearing your sleep device: ? Anytime you are sleeping, including during daytime naps. ? While taking prescription pain medicines, sleeping medicines, or medicines that make you drowsy.  If you smoke, do not smoke without supervision.  Keep all follow-up visits as told by your health care provider. This is important. Contact a health care provider if:  You keep feeling nauseous or you keep vomiting.  You feel light-headed.  You develop a rash.  You have a fever. Get help right away if:  You have trouble breathing. Summary  For several hours after your procedure, you may feel sleepy and have poor judgment.  Have a responsible adult stay with you for at least 24 hours or until you are awake and alert. This information is not intended to replace advice given to you by your health care provider. Make sure you discuss any questions you have with your health care provider. Document Revised: 10/29/2017 Document Reviewed: 11/21/2015 Elsevier Patient Education  [place]. How to Use Chlorhexidine for Bathing Chlorhexidine gluconate (CHG) is a germ-killing (antiseptic) solution that is used to clean the skin. It can get rid of the bacteria that normally live on the skin and can keep them away for about 24 hours. To clean your skin with CHG, you may be given:  A CHG solution to use in the shower or as part of a sponge bath.  A prepackaged cloth that contains CHG. Cleaning your skin with CHG may help lower the risk for infection:  While you are staying in the intensive care unit of the hospital.  If you have a vascular access, such as a central line, to provide short-term or long-term access to your veins.  If you have a catheter to drain urine from your bladder.  If you are on a ventilator. A ventilator is a  machine that helps you breathe by moving air in and out of your lungs.  After surgery. What are the risks? Risks of using CHG include:  A skin  reaction.  Hearing loss, if CHG gets in your ears.  Eye injury, if CHG gets in your eyes and is not rinsed out.  The CHG product catching fire. Make sure that you avoid smoking and flames after applying CHG to your skin. Do not use CHG:  If you have a chlorhexidine allergy or have previously reacted to chlorhexidine.  On babies younger than 66 months of age. How to use CHG solution  Use CHG only as told by your health care provider, and follow the instructions on the label.  Use the full amount of CHG as directed. Usually, this is one bottle. During a shower Follow these steps when using CHG solution during a shower (unless your health care provider gives you different instructions): 1. Start the shower. 2. Use your normal soap and shampoo to wash your face and hair. 3. Turn off the shower or move out of the shower stream. 4. Pour the CHG onto a clean washcloth. Do not use any type of brush or rough-edged sponge. 5. Starting at your neck, lather your body down to your toes. Make sure you follow these instructions: ? If you will be having surgery, pay special attention to the part of your body where you will be having surgery. Scrub this area for at least 1 minute. ? Do not use CHG on your head or face. If the solution gets into your ears or eyes, rinse them well with water. ? Avoid your genital area. ? Avoid any areas of skin that have broken skin, cuts, or scrapes. ? Scrub your back and under your arms. Make sure to wash skin folds. 6. Let the lather sit on your skin for 1-2 minutes or as long as told by your health care provider. 7. Thoroughly rinse your entire body in the shower. Make sure that all body creases and crevices are rinsed well. 8. Dry off with a clean towel. Do not put any substances on your body afterward--such as powder,  lotion, or perfume--unless you are told to do so by your health care provider. Only use lotions that are recommended by the manufacturer. 9. Put on clean clothes or pajamas. 10. If it is the night before your surgery, sleep in clean sheets.  During a sponge bath Follow these steps when using CHG solution during a sponge bath (unless your health care provider gives you different instructions): 1. Use your normal soap and shampoo to wash your face and hair. 2. Pour the CHG onto a clean washcloth. 3. Starting at your neck, lather your body down to your toes. Make sure you follow these instructions: ? If you will be having surgery, pay special attention to the part of your body where you will be having surgery. Scrub this area for at least 1 minute. ? Do not use CHG on your head or face. If the solution gets into your ears or eyes, rinse them well with water. ? Avoid your genital area. ? Avoid any areas of skin that have broken skin, cuts, or scrapes. ? Scrub your back and under your arms. Make sure to wash skin folds. 4. Let the lather sit on your skin for 1-2 minutes or as long as told by your health care provider. 5. Using a different clean, wet washcloth, thoroughly rinse your entire body. Make sure that all body creases and crevices are rinsed well. 6. Dry off with a clean towel. Do not put any substances on your body afterward--such as powder, lotion, or perfume--unless you are  told to do so by your health care provider. Only use lotions that are recommended by the manufacturer. 7. Put on clean clothes or pajamas. 8. If it is the night before your surgery, sleep in clean sheets. How to use CHG prepackaged cloths  Only use CHG cloths as told by your health care provider, and follow the instructions on the label.  Use the CHG cloth on clean, dry skin.  Do not use the CHG cloth on your head or face unless your health care provider tells you to.  When washing with the CHG cloth: ? Avoid  your genital area. ? Avoid any areas of skin that have broken skin, cuts, or scrapes. Before surgery Follow these steps when using a CHG cloth to clean before surgery (unless your health care provider gives you different instructions): 1. Using the CHG cloth, vigorously scrub the part of your body where you will be having surgery. Scrub using a back-and-forth motion for 3 minutes. The area on your body should be completely wet with CHG when you are done scrubbing. 2. Do not rinse. Discard the cloth and let the area air-dry. Do not put any substances on the area afterward, such as powder, lotion, or perfume. 3. Put on clean clothes or pajamas. 4. If it is the night before your surgery, sleep in clean sheets.  For general bathing Follow these steps when using CHG cloths for general bathing (unless your health care provider gives you different instructions). 1. Use a separate CHG cloth for each area of your body. Make sure you wash between any folds of skin and between your fingers and toes. Wash your body in the following order, switching to a new cloth after each step: ? The front of your neck, shoulders, and chest. ? Both of your arms, under your arms, and your hands. ? Your stomach and groin area, avoiding the genitals. ? Your right leg and foot. ? Your left leg and foot. ? The back of your neck, your back, and your buttocks. 2. Do not rinse. Discard the cloth and let the area air-dry. Do not put any substances on your body afterward--such as powder, lotion, or perfume--unless you are told to do so by your health care provider. Only use lotions that are recommended by the manufacturer. 3. Put on clean clothes or pajamas. Contact a health care provider if:  Your skin gets irritated after scrubbing.  You have questions about using your solution or cloth. Get help right away if:  Your eyes become very red or swollen.  Your eyes itch badly.  Your skin itches badly and is red or  swollen.  Your hearing changes.  You have trouble seeing.  You have swelling or tingling in your mouth or throat.  You have trouble breathing.  You swallow any chlorhexidine. Summary  Chlorhexidine gluconate (CHG) is a germ-killing (antiseptic) solution that is used to clean the skin. Cleaning your skin with CHG may help to lower your risk for infection.  You may be given CHG to use for bathing. It may be in a bottle or in a prepackaged cloth to use on your skin. Carefully follow your health care provider's instructions and the instructions on the product label.  Do not use CHG if you have a chlorhexidine allergy.  Contact your health care provider if your skin gets irritated after scrubbing. This information is not intended to replace advice given to you by your health care provider. Make sure you discuss any questions you have  with your health care provider. Document Revised: 10/17/2018 Document Reviewed: 06/28/2017 Elsevier Patient Education  [place].

## 2020-07-06 ENCOUNTER — Ambulatory Visit: Payer: Medicare Other | Admitting: General Surgery

## 2020-07-06 ENCOUNTER — Other Ambulatory Visit: Payer: Self-pay

## 2020-07-06 ENCOUNTER — Encounter (HOSPITAL_COMMUNITY): Payer: Self-pay

## 2020-07-06 ENCOUNTER — Ambulatory Visit (INDEPENDENT_AMBULATORY_CARE_PROVIDER_SITE_OTHER): Payer: Medicare Other | Admitting: General Surgery

## 2020-07-06 ENCOUNTER — Encounter: Payer: Self-pay | Admitting: General Surgery

## 2020-07-06 ENCOUNTER — Encounter (HOSPITAL_COMMUNITY)
Admission: RE | Admit: 2020-07-06 | Discharge: 2020-07-06 | Disposition: A | Discharge status: Home / Self Care | Payer: Medicare Other | Source: Ambulatory Visit | Attending: General Surgery | Admitting: General Surgery

## 2020-07-06 VITALS — BP 152/86 | HR 80 | Temp 97.8°F | Resp 16 | Ht 62.0 in | Wt 162.0 lb

## 2020-07-06 DIAGNOSIS — E1129 Type 2 diabetes mellitus with other diabetic kidney complication: Secondary | ICD-10-CM | POA: Diagnosis not present

## 2020-07-06 DIAGNOSIS — D509 Iron deficiency anemia, unspecified: Secondary | ICD-10-CM | POA: Diagnosis not present

## 2020-07-06 DIAGNOSIS — D631 Anemia in chronic kidney disease: Secondary | ICD-10-CM | POA: Diagnosis not present

## 2020-07-06 DIAGNOSIS — Z992 Dependence on renal dialysis: Secondary | ICD-10-CM | POA: Diagnosis not present

## 2020-07-06 DIAGNOSIS — Z09 Encounter for follow-up examination after completed treatment for conditions other than malignant neoplasm: Secondary | ICD-10-CM

## 2020-07-06 DIAGNOSIS — N2581 Secondary hyperparathyroidism of renal origin: Secondary | ICD-10-CM | POA: Diagnosis not present

## 2020-07-06 DIAGNOSIS — N186 End stage renal disease: Secondary | ICD-10-CM | POA: Diagnosis not present

## 2020-07-06 NOTE — Progress Notes (Signed)
Subjective:     Megan [name]  Here for follow-up visit.  Patient states she is doing well.  She still has an area high on the pectoralis major muscle on the left side which is sore.  She does have some skin attachment to this area. Objective:    BP (!) 152/86   Pulse 80   Temp 97.8 F (36.6 C) (Oral)   Resp 16   Ht 5\' 2"  (1.575 m)   Wt 162 lb (73.5 kg)   SpO2 95%   BMI 29.63 kg/m   General:  alert, cooperative and no distress  Left mastectomy healing well.  No hematoma or seroma present.  There is some skin retraction towards the pectoralis major muscle superiorly.     Assessment:    Doing well postoperatively.    Plan:   To have reexcision of right breast biopsy next week.

## 2020-07-07 ENCOUNTER — Other Ambulatory Visit: Payer: Self-pay

## 2020-07-07 ENCOUNTER — Other Ambulatory Visit (HOSPITAL_COMMUNITY)
Admission: RE | Admit: 2020-07-07 | Discharge: 2020-07-07 | Disposition: A | Discharge status: Home / Self Care | Payer: Medicare Other | Source: Ambulatory Visit | Attending: General Surgery | Admitting: General Surgery

## 2020-07-07 DIAGNOSIS — Z20822 Contact with and (suspected) exposure to covid-19: Secondary | ICD-10-CM | POA: Diagnosis not present

## 2020-07-07 DIAGNOSIS — Z01812 Encounter for preprocedural laboratory examination: Secondary | ICD-10-CM | POA: Diagnosis not present

## 2020-07-07 LAB — SARS CORONAVIRUS 2 (TAT 6-24 HRS): SARS Coronavirus 2: NEGATIVE

## 2020-07-09 DIAGNOSIS — D631 Anemia in chronic kidney disease: Secondary | ICD-10-CM | POA: Diagnosis not present

## 2020-07-09 DIAGNOSIS — D509 Iron deficiency anemia, unspecified: Secondary | ICD-10-CM | POA: Diagnosis not present

## 2020-07-09 DIAGNOSIS — Z992 Dependence on renal dialysis: Secondary | ICD-10-CM | POA: Diagnosis not present

## 2020-07-09 DIAGNOSIS — E1129 Type 2 diabetes mellitus with other diabetic kidney complication: Secondary | ICD-10-CM | POA: Diagnosis not present

## 2020-07-09 DIAGNOSIS — N186 End stage renal disease: Secondary | ICD-10-CM | POA: Diagnosis not present

## 2020-07-09 DIAGNOSIS — N2581 Secondary hyperparathyroidism of renal origin: Secondary | ICD-10-CM | POA: Diagnosis not present

## 2020-07-12 DIAGNOSIS — D509 Iron deficiency anemia, unspecified: Secondary | ICD-10-CM | POA: Diagnosis not present

## 2020-07-12 DIAGNOSIS — D631 Anemia in chronic kidney disease: Secondary | ICD-10-CM | POA: Diagnosis not present

## 2020-07-12 DIAGNOSIS — N186 End stage renal disease: Secondary | ICD-10-CM | POA: Diagnosis not present

## 2020-07-12 DIAGNOSIS — E1129 Type 2 diabetes mellitus with other diabetic kidney complication: Secondary | ICD-10-CM | POA: Diagnosis not present

## 2020-07-12 DIAGNOSIS — Z992 Dependence on renal dialysis: Secondary | ICD-10-CM | POA: Diagnosis not present

## 2020-07-12 DIAGNOSIS — N2581 Secondary hyperparathyroidism of renal origin: Secondary | ICD-10-CM | POA: Diagnosis not present

## 2020-07-13 ENCOUNTER — Encounter (HOSPITAL_COMMUNITY)
Admission: RE | Disposition: A | Discharge status: Home / Self Care | Payer: Self-pay | Source: Home / Self Care | Attending: General Surgery

## 2020-07-13 ENCOUNTER — Ambulatory Visit (HOSPITAL_COMMUNITY): Payer: Medicare Other | Admitting: Certified Registered"

## 2020-07-13 ENCOUNTER — Encounter (HOSPITAL_COMMUNITY): Payer: Self-pay | Admitting: General Surgery

## 2020-07-13 ENCOUNTER — Other Ambulatory Visit: Payer: Self-pay

## 2020-07-13 ENCOUNTER — Ambulatory Visit (HOSPITAL_COMMUNITY)
Admission: RE | Admit: 2020-07-13 | Discharge: 2020-07-13 | Disposition: A | Discharge status: Home / Self Care | Payer: Medicare Other | Attending: General Surgery | Admitting: General Surgery

## 2020-07-13 DIAGNOSIS — C7981 Secondary malignant neoplasm of breast: Secondary | ICD-10-CM | POA: Insufficient documentation

## 2020-07-13 DIAGNOSIS — C50211 Malignant neoplasm of upper-inner quadrant of right female breast: Secondary | ICD-10-CM

## 2020-07-13 DIAGNOSIS — E119 Type 2 diabetes mellitus without complications: Secondary | ICD-10-CM | POA: Diagnosis not present

## 2020-07-13 DIAGNOSIS — Z885 Allergy status to narcotic agent status: Secondary | ICD-10-CM | POA: Insufficient documentation

## 2020-07-13 DIAGNOSIS — N186 End stage renal disease: Secondary | ICD-10-CM | POA: Diagnosis not present

## 2020-07-13 DIAGNOSIS — E1122 Type 2 diabetes mellitus with diabetic chronic kidney disease: Secondary | ICD-10-CM | POA: Diagnosis not present

## 2020-07-13 DIAGNOSIS — Z9011 Acquired absence of right breast and nipple: Secondary | ICD-10-CM | POA: Insufficient documentation

## 2020-07-13 DIAGNOSIS — C50911 Malignant neoplasm of unspecified site of right female breast: Secondary | ICD-10-CM | POA: Diagnosis not present

## 2020-07-13 DIAGNOSIS — N641 Fat necrosis of breast: Secondary | ICD-10-CM | POA: Diagnosis not present

## 2020-07-13 DIAGNOSIS — I132 Hypertensive heart and chronic kidney disease with heart failure and with stage 5 chronic kidney disease, or end stage renal disease: Secondary | ICD-10-CM | POA: Diagnosis not present

## 2020-07-13 DIAGNOSIS — I509 Heart failure, unspecified: Secondary | ICD-10-CM | POA: Diagnosis not present

## 2020-07-13 HISTORY — PX: MASTECTOMY, PARTIAL: SHX709

## 2020-07-13 LAB — POCT I-STAT, CHEM 8
BUN: 28 mg/dL — ABNORMAL HIGH (ref 6–20)
Calcium, Ion: 1.28 mmol/L (ref 1.15–1.40)
Chloride: 101 mmol/L (ref 98–111)
Creatinine, Ser: 8.2 mg/dL — ABNORMAL HIGH (ref 0.44–1.00)
Glucose, Bld: 107 mg/dL — ABNORMAL HIGH (ref 70–99)
HCT: 36 % (ref 36.0–46.0)
Hemoglobin: 12.2 g/dL (ref 12.0–15.0)
Potassium: 3.5 mmol/L (ref 3.5–5.1)
Sodium: 139 mmol/L (ref 135–145)
TCO2: 28 mmol/L (ref 22–32)

## 2020-07-13 LAB — GLUCOSE, CAPILLARY: Glucose-Capillary: 103 mg/dL — ABNORMAL HIGH (ref 70–99)

## 2020-07-13 SURGERY — MASTECTOMY PARTIAL
Anesthesia: General | Site: Breast | Laterality: Right

## 2020-07-13 MED ORDER — PROPOFOL 10 MG/ML IV BOLUS
INTRAVENOUS | Status: AC
  Filled 2020-07-13: qty 20

## 2020-07-13 MED ORDER — 0.9 % SODIUM CHLORIDE (POUR BTL) OPTIME
TOPICAL | Status: DC | PRN
  Administered 2020-07-13: 1000 mL

## 2020-07-13 MED ORDER — ONDANSETRON HCL 4 MG PO TABS: 4.0000 mg | ORAL_TABLET | Freq: Three times a day (TID) | ORAL | 1 refills | Status: DC | PRN

## 2020-07-13 MED ORDER — MIDAZOLAM HCL 2 MG/2ML IJ SOLN
INTRAMUSCULAR | Status: AC
  Filled 2020-07-13: qty 2

## 2020-07-13 MED ORDER — ORAL CARE MOUTH RINSE: 15.0000 mL | Freq: Once | OROMUCOSAL | Status: AC

## 2020-07-13 MED ORDER — PROPOFOL 10 MG/ML IV BOLUS
INTRAVENOUS | Status: DC | PRN
  Administered 2020-07-13: 25 ug/kg/min via INTRAVENOUS
  Administered 2020-07-13: 150 mg via INTRAVENOUS

## 2020-07-13 MED ORDER — CEFAZOLIN SODIUM-DEXTROSE 2-4 GM/100ML-% IV SOLN
INTRAVENOUS | Status: AC
  Filled 2020-07-13: qty 100

## 2020-07-13 MED ORDER — FENTANYL CITRATE (PF) 100 MCG/2ML IJ SOLN
INTRAMUSCULAR | Status: DC | PRN
  Administered 2020-07-13 (×2): 25 ug via INTRAVENOUS
  Administered 2020-07-13: 50 ug via INTRAVENOUS

## 2020-07-13 MED ORDER — CEFAZOLIN SODIUM-DEXTROSE 2-4 GM/100ML-% IV SOLN
2.0000 g | INTRAVENOUS | Status: AC
  Administered 2020-07-13: 2 g via INTRAVENOUS

## 2020-07-13 MED ORDER — ONDANSETRON HCL 4 MG/2ML IJ SOLN: 4.0000 mg | Freq: Once | INTRAMUSCULAR | Status: DC | PRN

## 2020-07-13 MED ORDER — ONDANSETRON HCL 4 MG/2ML IJ SOLN
INTRAMUSCULAR | Status: DC | PRN
  Administered 2020-07-13: 4 mg via INTRAVENOUS

## 2020-07-13 MED ORDER — SODIUM CHLORIDE 0.9 % IV SOLN: INTRAVENOUS | Status: DC

## 2020-07-13 MED ORDER — BUPIVACAINE HCL (PF) 0.5 % IJ SOLN
INTRAMUSCULAR | Status: DC | PRN
  Administered 2020-07-13: 8 mL

## 2020-07-13 MED ORDER — GLYCOPYRROLATE PF 0.2 MG/ML IJ SOSY
PREFILLED_SYRINGE | INTRAMUSCULAR | Status: AC
  Filled 2020-07-13: qty 1

## 2020-07-13 MED ORDER — FENTANYL CITRATE (PF) 100 MCG/2ML IJ SOLN
INTRAMUSCULAR | Status: AC
  Filled 2020-07-13: qty 2

## 2020-07-13 MED ORDER — PHENYLEPHRINE 40 MCG/ML (10ML) SYRINGE FOR IV PUSH (FOR BLOOD PRESSURE SUPPORT)
PREFILLED_SYRINGE | INTRAVENOUS | Status: AC
  Filled 2020-07-13: qty 10

## 2020-07-13 MED ORDER — FENTANYL CITRATE (PF) 100 MCG/2ML IJ SOLN: 25.0000 ug | INTRAMUSCULAR | Status: DC | PRN

## 2020-07-13 MED ORDER — CHLORHEXIDINE GLUCONATE CLOTH 2 % EX PADS: 6.0000 | MEDICATED_PAD | Freq: Once | CUTANEOUS | Status: DC

## 2020-07-13 MED ORDER — MIDAZOLAM HCL 5 MG/5ML IJ SOLN
INTRAMUSCULAR | Status: DC | PRN
  Administered 2020-07-13: 2 mg via INTRAVENOUS

## 2020-07-13 MED ORDER — GLYCOPYRROLATE 0.2 MG/ML IJ SOLN
INTRAMUSCULAR | Status: DC | PRN
  Administered 2020-07-13: .2 mg via INTRAVENOUS

## 2020-07-13 MED ORDER — BUPIVACAINE LIPOSOME 1.3 % IJ SUSP
INTRAMUSCULAR | Status: AC
  Filled 2020-07-13: qty 20

## 2020-07-13 MED ORDER — PHENYLEPHRINE HCL (PRESSORS) 10 MG/ML IV SOLN
INTRAVENOUS | Status: DC | PRN
  Administered 2020-07-13: 80 ug via INTRAVENOUS

## 2020-07-13 MED ORDER — BUPIVACAINE HCL (PF) 0.5 % IJ SOLN
INTRAMUSCULAR | Status: AC
  Filled 2020-07-13: qty 30

## 2020-07-13 MED ORDER — LACTATED RINGERS IV SOLN: INTRAVENOUS | Status: DC

## 2020-07-13 MED ORDER — HYDROCODONE-ACETAMINOPHEN 5-325 MG PO TABS: 1.0000 | ORAL_TABLET | Freq: Four times a day (QID) | ORAL | 0 refills | Status: DC | PRN

## 2020-07-13 MED ORDER — DEXAMETHASONE SODIUM PHOSPHATE 4 MG/ML IJ SOLN
INTRAMUSCULAR | Status: DC | PRN
  Administered 2020-07-13: 4 mg via INTRAVENOUS

## 2020-07-13 MED ORDER — CHLORHEXIDINE GLUCONATE 0.12 % MT SOLN
15.0000 mL | Freq: Once | OROMUCOSAL | Status: AC
  Administered 2020-07-13: 15 mL via OROMUCOSAL

## 2020-07-13 SURGICAL SUPPLY — 29 items
ADH SKN CLS APL DERMABOND .7 (GAUZE/BANDAGES/DRESSINGS) ×1
CLOTH BEACON ORANGE TIMEOUT ST (SAFETY) ×2 IMPLANT
COVER LIGHT HANDLE STERIS (MISCELLANEOUS) ×4 IMPLANT
COVER WAND RF STERILE (DRAPES) ×2 IMPLANT
DECANTER SPIKE VIAL GLASS SM (MISCELLANEOUS) ×2 IMPLANT
DERMABOND ADVANCED (GAUZE/BANDAGES/DRESSINGS) ×1
DERMABOND ADVANCED .7 DNX12 (GAUZE/BANDAGES/DRESSINGS) ×1 IMPLANT
DURAPREP 26ML APPLICATOR (WOUND CARE) ×1 IMPLANT
ELECT REM PT RETURN 9FT ADLT (ELECTROSURGICAL) ×2
ELECTRODE REM PT RTRN 9FT ADLT (ELECTROSURGICAL) ×1 IMPLANT
GLOVE BIOGEL PI IND STRL 7.0 (GLOVE) ×2 IMPLANT
GLOVE BIOGEL PI INDICATOR 7.0 (GLOVE) ×2
GLOVE ECLIPSE 6.5 STRL STRAW (GLOVE) ×1 IMPLANT
GLOVE SURG SS PI 7.5 STRL IVOR (GLOVE) ×2 IMPLANT
GOWN STRL REUS W/TWL LRG LVL3 (GOWN DISPOSABLE) ×4 IMPLANT
KIT TURNOVER KIT A (KITS) ×2 IMPLANT
MANIFOLD NEPTUNE II (INSTRUMENTS) ×2 IMPLANT
NDL HYPO 25X1 1.5 SAFETY (NEEDLE) ×1 IMPLANT
NEEDLE HYPO 25X1 1.5 SAFETY (NEEDLE) ×2 IMPLANT
NS IRRIG 1000ML POUR BTL (IV SOLUTION) ×2 IMPLANT
PACK MINOR (CUSTOM PROCEDURE TRAY) ×2 IMPLANT
PAD ARMBOARD 7.5X6 YLW CONV (MISCELLANEOUS) ×2 IMPLANT
PENCIL SMOKE EVACUATOR (MISCELLANEOUS) ×2 IMPLANT
SET BASIN LINEN APH (SET/KITS/TRAYS/PACK) ×2 IMPLANT
SPONGE LAP 18X18 RF (DISPOSABLE) ×2 IMPLANT
SUT MNCRL AB 4-0 PS2 18 (SUTURE) ×2 IMPLANT
SUT VIC AB 3-0 SH 27 (SUTURE) ×2
SUT VIC AB 3-0 SH 27X BRD (SUTURE) ×1 IMPLANT
SYR CONTROL 10ML LL (SYRINGE) ×2 IMPLANT

## 2020-07-13 NOTE — Anesthesia Postprocedure Evaluation (Signed)
Anesthesia Post Note  Patient: Megan [name]  Procedure(s) Performed: RIGHT PARTIAL MASTECTOMY (Right Breast)  Patient location during evaluation: PACU Anesthesia Type: General Level of consciousness: patient cooperative and responds to stimulation Pain management: pain level controlled Vital Signs Assessment: post-procedure vital signs reviewed and stable Respiratory status: spontaneous breathing, respiratory function stable and nonlabored ventilation Cardiovascular status: blood pressure returned to baseline and stable Postop Assessment: no headache and no backache Anesthetic complications: no   No complications documented.   Last Vitals:  Vitals:   07/13/20 0704  BP: (!) 144/79  Pulse: 76  Resp: 16  Temp: 36.5 C  SpO2: 97%    Last Pain:  Vitals:   07/13/20 0704  TempSrc: Oral  PainSc: 0-No pain                 Tacy Learn

## 2020-07-13 NOTE — Discharge Instructions (Signed)
Breast Biopsy, Care After This sheet gives you information about how to care for yourself after your procedure. Your health care provider may also give you more specific instructions. If you have problems or questions, contact your health care provider. What can I expect after the procedure? After your procedure, it is common to have:  Bruising on your breast.  Numbness, tingling, or pain near your biopsy site. Follow these instructions at home: Medicines  Take over-the-counter and prescription medicines only as told by your health care provider.  Do not drive for 24 hours if you were given a sedative during your procedure.  Do not drink alcohol while taking pain medicine.  Do not drive or use heavy machinery while taking prescription pain medicine. Biopsy site care      Follow instructions from your health care provider about how to take care of your incision or puncture site. Make sure you: ? Wash your hands with soap and water before you change your bandage (dressing). If soap and water are not available, use hand sanitizer. ? Change your dressing as told by your health care provider. ? Leave any stitches (sutures), skin glue, or adhesive strips in place. These skin closures may need to stay in place for 2 weeks or longer. If adhesive strip edges start to loosen and curl up, you may trim the loose edges. Do not remove adhesive strips completely unless your health care provider tells you to do that.  If you have sutures, keep them dry when bathing.  Check your incision or puncture area every day for signs of infection. Check for: ? Redness, swelling, or pain. ? Fluid or blood. ? Warmth. ? Pus or a bad smell.  Protect the biopsy area. Do not let the area get bumped. Activity  If you had an incision during your procedure, avoid activities that may pull the incision site open. Avoid stretching, reaching above your head, exercise, sports, or lifting anything that is heavier than  3 lb (1.4 kg).  Return to your normal activities as told by your health care provider. Ask your health care provider what activities are safe for you. Managing pain  If directed, put ice on the biopsy site to relieve tenderness: ? Put ice in a plastic bag. ? Place a towel between your skin and the bag. ? Leave the ice on for 20 minutes, 2-3 times a day. General instructions  Resume your usual diet.  Wear a good support bra for as long as told by your health care provider.  Get checked for extra fluid around your lymph nodes (lymphedema) as often as told by your health care provider.  Keep all follow-up visits as told by your health care provider. This is important. Contact a health care provider if:  You have more redness, swelling, or pain at the biopsy site.  You have more fluid or blood coming from your biopsy site.  Your biopsy site feels warm to the touch.  You have pus or a bad smell coming from the biopsy site.  Your biopsy site breaks open after the sutures or adhesive strips have been removed.  You have a rash.  You have a fever. Get help right away if you have:  Increased bleeding (more than a small spot) from the biopsy site.  Difficulty breathing.  Red streaks around the biopsy site. Summary  After your procedure, it is common to have bruising, numbness, tingling, or pain near the biopsy site.  Do not drive or use heavy machinery  while taking prescription pain medicine.  Wear a good support bra for as long as told by your health care provider.  If you had an incision during your procedure, avoid activities that may pull the incision site open. Ask your health care provider what activities are safe for you. This information is not intended to replace advice given to you by your health care provider. Make sure you discuss any questions you have with your health care provider. Document Revised: 01/16/2018 Document Reviewed: 01/16/2018 Elsevier Patient  Education  2020 [place] Anesthesia, Adult, Care After This sheet gives you information about how to care for yourself after your procedure. Your health care provider may also give you more specific instructions. If you have problems or questions, contact your health care provider. What can I expect after the procedure? After the procedure, the following side effects are common:  Pain or discomfort at the IV site.  Nausea.  Vomiting.  Sore throat.  Trouble concentrating.  Feeling cold or chills.  Weak or tired.  Sleepiness and fatigue.  Soreness and body aches. These side effects can affect parts of the body that were not involved in surgery. Follow these instructions at home:  For at least 24 hours after the procedure:  Have a responsible adult stay with you. It is important to have someone help care for you until you are awake and alert.  Rest as needed.  Do not: ? Participate in activities in which you could fall or become injured. ? Drive. ? Use heavy machinery. ? Drink alcohol. ? Take sleeping pills or medicines that cause drowsiness. ? Make important decisions or sign legal documents. ? Take care of children on your own. Eating and drinking  Follow any instructions from your health care provider about eating or drinking restrictions.  When you feel hungry, start by eating small amounts of foods that are soft and easy to digest (bland), such as toast. Gradually return to your regular diet.  Drink enough fluid to keep your urine pale yellow.  If you vomit, rehydrate by drinking water, juice, or clear broth. General instructions  If you have sleep apnea, surgery and certain medicines can increase your risk for breathing problems. Follow instructions from your health care provider about wearing your sleep device: ? Anytime you are sleeping, including during daytime naps. ? While taking prescription pain medicines, sleeping medicines, or medicines  that make you drowsy.  Return to your normal activities as told by your health care provider. Ask your health care provider what activities are safe for you.  Take over-the-counter and prescription medicines only as told by your health care provider.  If you smoke, do not smoke without supervision.  Keep all follow-up visits as told by your health care provider. This is important. Contact a health care provider if:  You have nausea or vomiting that does not get better with medicine.  You cannot eat or drink without vomiting.  You have pain that does not get better with medicine.  You are unable to pass urine.  You develop a skin rash.  You have a fever.  You have redness around your IV site that gets worse. Get help right away if:  You have difficulty breathing.  You have chest pain.  You have blood in your urine or stool, or you vomit blood. Summary  After the procedure, it is common to have a sore throat or nausea. It is also common to feel tired.  Have a responsible adult  stay with you for the first 24 hours after general anesthesia. It is important to have someone help care for you until you are awake and alert.  When you feel hungry, start by eating small amounts of foods that are soft and easy to digest (bland), such as toast. Gradually return to your regular diet.  Drink enough fluid to keep your urine pale yellow.  Return to your normal activities as told by your health care provider. Ask your health care provider what activities are safe for you. This information is not intended to replace advice given to you by your health care provider. Make sure you discuss any questions you have with your health care provider. Document Revised: 08/03/2017 Document Reviewed: 03/16/2017 Elsevier Patient Education  [place].

## 2020-07-13 NOTE — Anesthesia Procedure Notes (Signed)
Procedure Name: LMA Insertion Performed by: Tacy Learn, CRNA Pre-anesthesia Checklist: Patient identified, Emergency Drugs available, Suction available, Patient being monitored and Timeout performed Patient Re-evaluated:Patient Re-evaluated prior to induction Oxygen Delivery Method: Circle system utilized Preoxygenation: Pre-oxygenation with 100% oxygen Induction Type: IV induction LMA: LMA inserted LMA Size: 4.0 Placement Confirmation: CO2 detector,  breath sounds checked- equal and bilateral and positive ETCO2 Tube secured with: Tape Dental Injury: Teeth and Oropharynx as per pre-operative assessment

## 2020-07-13 NOTE — Anesthesia Preprocedure Evaluation (Signed)
Anesthesia Evaluation    History of Anesthesia Complications (+) PONV and history of anesthetic complications  Airway Mallampati: I       Dental  (+) Teeth Intact   Pulmonary shortness of breath,           Cardiovascular hypertension, + CAD and +CHF       Neuro/Psych  Headaches,    GI/Hepatic GERD  Medicated,  Endo/Other  diabetesHypothyroidism   Renal/GU ESRF and DialysisRenal disease     Musculoskeletal   Abdominal   Peds  Hematology  (+) Blood dyscrasia, anemia ,   Anesthesia Other Findings   Reproductive/Obstetrics                             Anesthesia Physical  Anesthesia Plan  ASA: III  Anesthesia Plan: General   Post-op Pain Management:    Induction:   PONV Risk Score and Plan: Ondansetron  Airway Management Planned:   Additional Equipment:   Intra-op Plan:   Post-operative Plan:   Informed Consent:   Plan Discussed with:   Anesthesia Plan Comments:         Anesthesia Quick Evaluation

## 2020-07-13 NOTE — Transfer of Care (Signed)
Immediate Anesthesia Transfer of Care Note  Patient: Megan [name]  Procedure(s) Performed: RIGHT PARTIAL MASTECTOMY (Right Breast)  Patient Location: PACU  Anesthesia Type:General  Level of Consciousness: drowsy and patient cooperative  Airway & Oxygen Therapy: Patient Spontanous Breathing  Post-op Assessment: Report given to RN, Post -op Vital signs reviewed and stable and Patient moving all extremities  Post vital signs: Reviewed and stable  Last Vitals:  Vitals Value Taken Time  BP 114/63 07/13/20 0815  Temp    Pulse 77 07/13/20 0815  Resp 12 07/13/20 0815  SpO2 92 % 07/13/20 0815  Vitals shown include unvalidated device data.  Last Pain:  Vitals:   07/13/20 0704  TempSrc: Oral  PainSc: 0-No pain      Patients Stated Pain Goal: 5 (82/50/03 7048)  Complications: No complications documented.

## 2020-07-13 NOTE — Interval H&P Note (Signed)
History and Physical Interval Note:  07/13/2020 7:14 AM  Megan [name]  has presented today for surgery, with the diagnosis of Right breast cancer.  The various methods of treatment have been discussed with the patient and family. After consideration of risks, benefits and other options for treatment, the patient has consented to  Procedure(s): RIGHT PARTIAL MASTECTOMY (Right) as a surgical intervention.  The patient's history has been reviewed, patient examined, no change in status, stable for surgery.  I have reviewed the patient's chart and labs.  Questions were answered to the patient's satisfaction.     Aviva Signs

## 2020-07-13 NOTE — Op Note (Signed)
Patient:  Megan [name]  DOB:  10-27-1960  MRN:  [id number]   Preop Diagnosis: Recurrent right breast cancer  Postop Diagnosis: Same  Procedure: Right partial mastectomy  Surgeon: Aviva Signs, MD  Anes: General  Indications: Patient is a 59 year old black female status post a right modified radical mastectomy who was found on recent biopsy to have a recurrence of the cancer in close proximity to the chest wall in the upper, inner quadrant.  The patient now presents for a wide excision/right partial mastectomy.  The risks and benefits of the procedure including bleeding, infection, recurrence of the cancer were fully explained to the patient, who gave informed consent.  Procedure note: The patient was placed in the supine position.  After general anesthesia was administered, the right mastectomy area was prepped and draped using the usual sterile technique with DuraPrep.  Surgical site confirmation was performed.  I went through a previous superior incision.  This was extended medially and laterally.  The dissection was taken down to the mass which appeared to be extending just above the chest wall.  I excised a wide margin of tissue off the chest wall.  A short suture was placed superiorly and a long suture placed laterally for orientation purposes.  It was sent to pathology for further examination.  A bleeding was controlled using Bovie electrocautery.  There did not appear to be any grossly involved tissue left.  The subcutaneous layer was reapproximated using a 3-0 Vicryl interrupted suture.  0.5% Marcaine was instilled into the surrounding wound.  The skin was closed using a 4-0 Monocryl subcuticular suture.  Dermabond was applied.  All tape and needle counts were correct at the end of the procedure.  The patient was awakened and transferred to PACU in stable condition.  Complications: None  EBL: Minimal  Specimen: Right breast tissue

## 2020-07-14 ENCOUNTER — Encounter (HOSPITAL_COMMUNITY): Payer: Self-pay | Admitting: General Surgery

## 2020-07-14 DIAGNOSIS — D631 Anemia in chronic kidney disease: Secondary | ICD-10-CM | POA: Diagnosis not present

## 2020-07-14 DIAGNOSIS — N040 Nephrotic syndrome with minor glomerular abnormality: Secondary | ICD-10-CM | POA: Diagnosis not present

## 2020-07-14 DIAGNOSIS — Z992 Dependence on renal dialysis: Secondary | ICD-10-CM | POA: Diagnosis not present

## 2020-07-14 DIAGNOSIS — N2581 Secondary hyperparathyroidism of renal origin: Secondary | ICD-10-CM | POA: Diagnosis not present

## 2020-07-14 DIAGNOSIS — E1129 Type 2 diabetes mellitus with other diabetic kidney complication: Secondary | ICD-10-CM | POA: Diagnosis not present

## 2020-07-14 DIAGNOSIS — E119 Type 2 diabetes mellitus without complications: Secondary | ICD-10-CM | POA: Diagnosis not present

## 2020-07-14 DIAGNOSIS — N186 End stage renal disease: Secondary | ICD-10-CM | POA: Diagnosis not present

## 2020-07-14 DIAGNOSIS — D509 Iron deficiency anemia, unspecified: Secondary | ICD-10-CM | POA: Diagnosis not present

## 2020-07-15 IMAGING — MG DIGITAL DIAGNOSTIC BILAT W/ TOMO W/ CAD
8 of 15 series · 8 of 39 positions shown · non-contrast
Comparison: Previous examinations.

CLINICAL DATA: Status post right lumpectomy for breast cancer 7777.

EXAM:
DIGITAL DIAGNOSTIC BILATERAL MAMMOGRAM WITH CAD AND TOMO

[R ML]
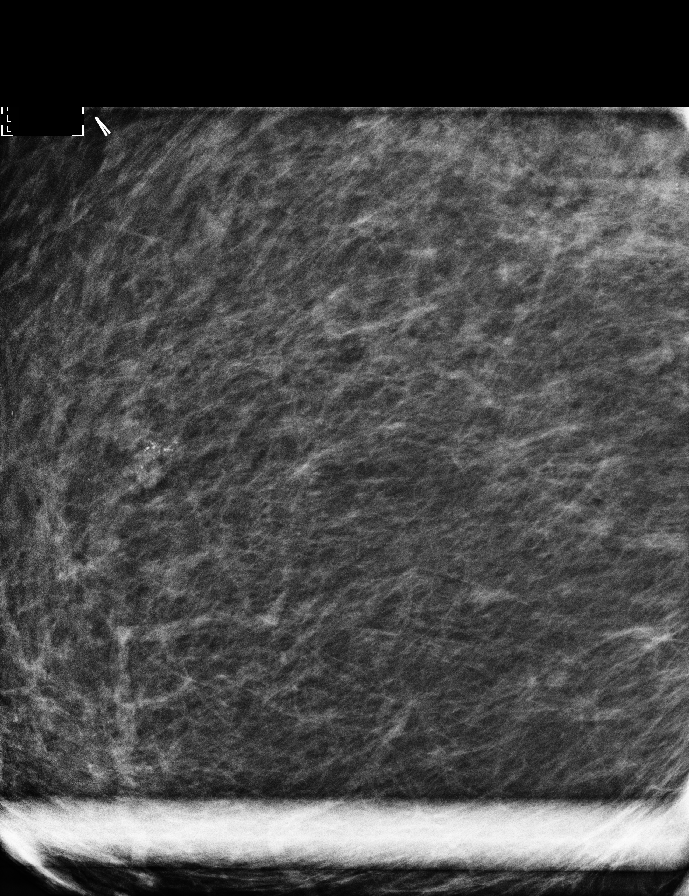

[R MLO]
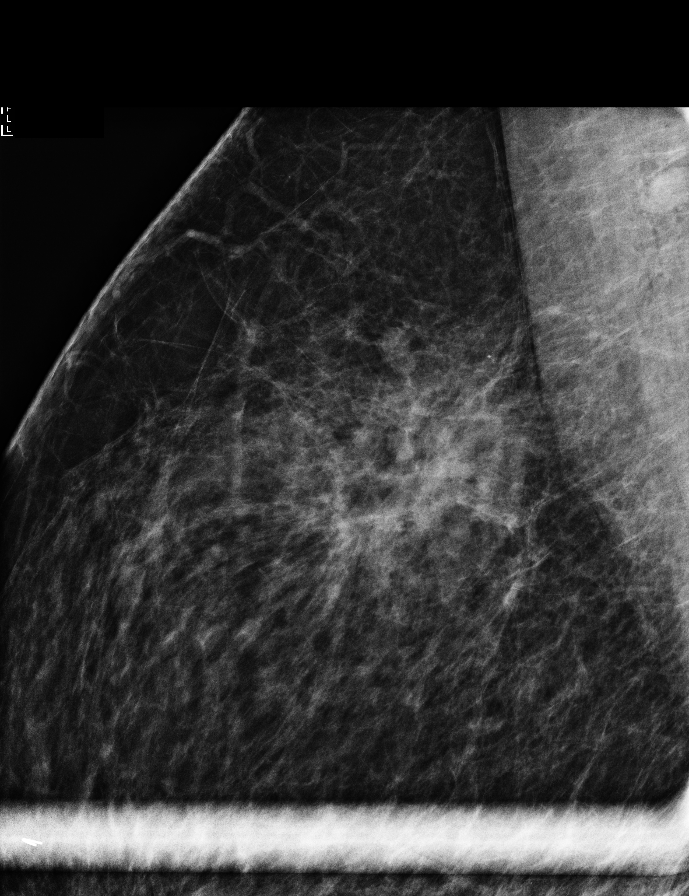

[R CC]
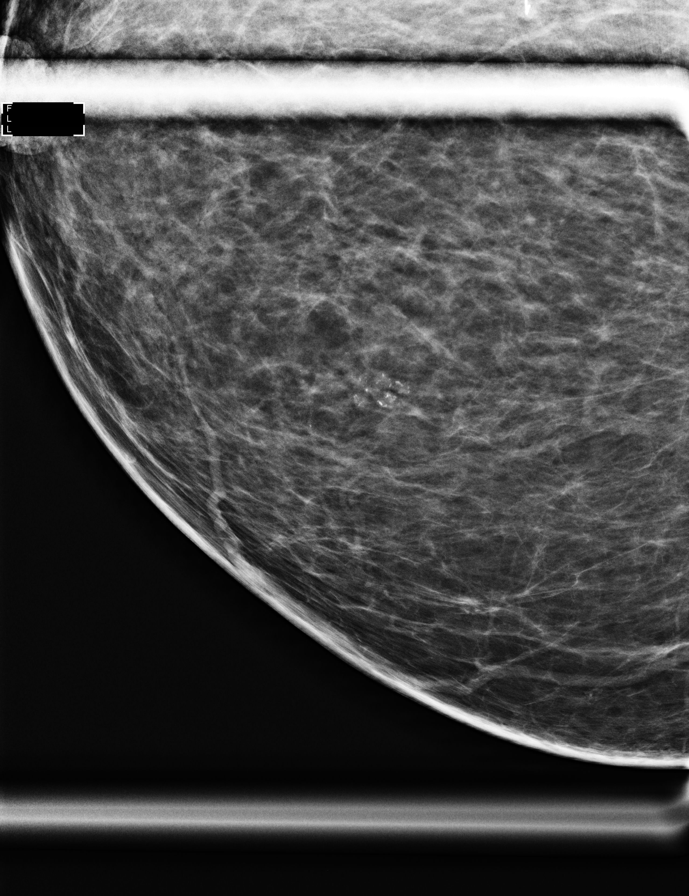

[R MLO synth-2D (1 of 2)]
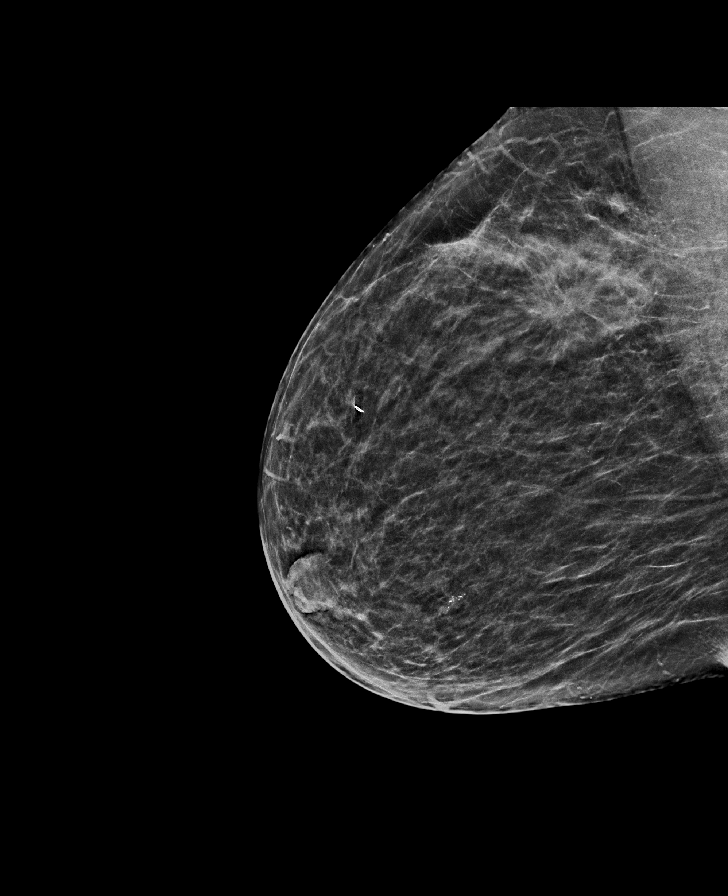

[R ML synth-2D]
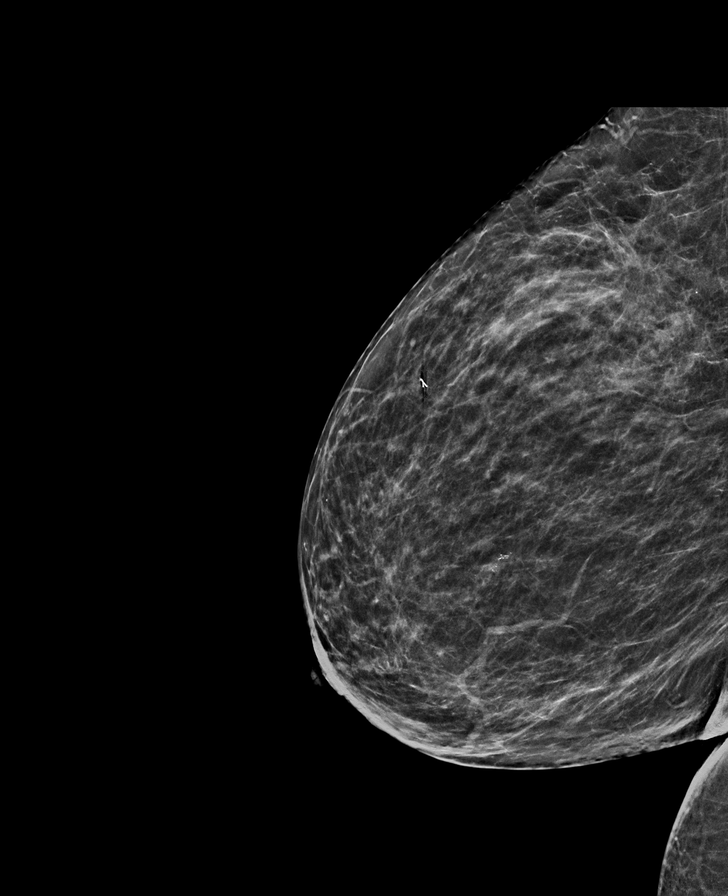

[L MLO synth-2D]
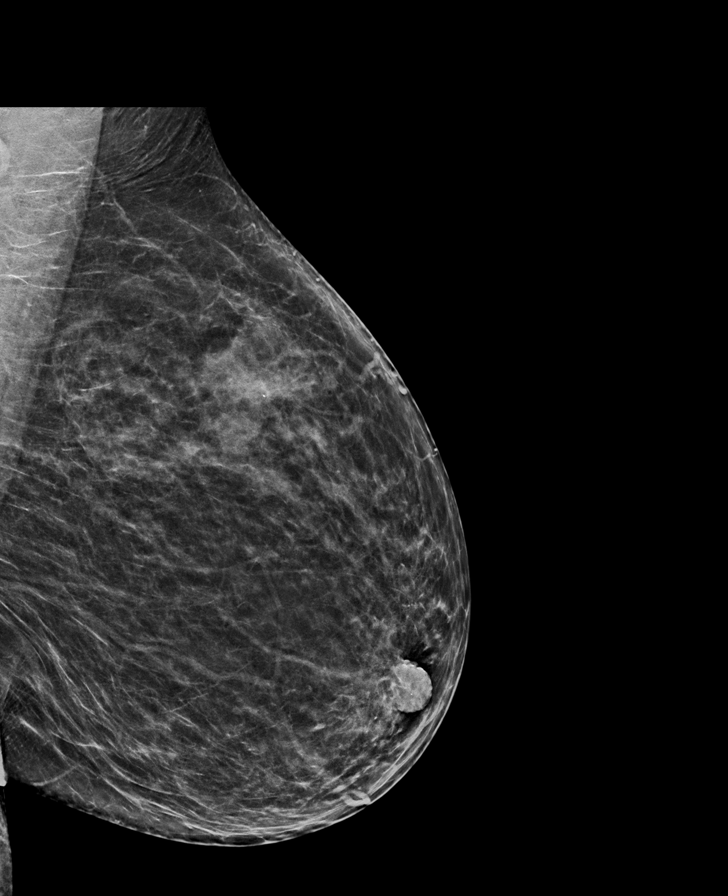

[R CC synth-2D]
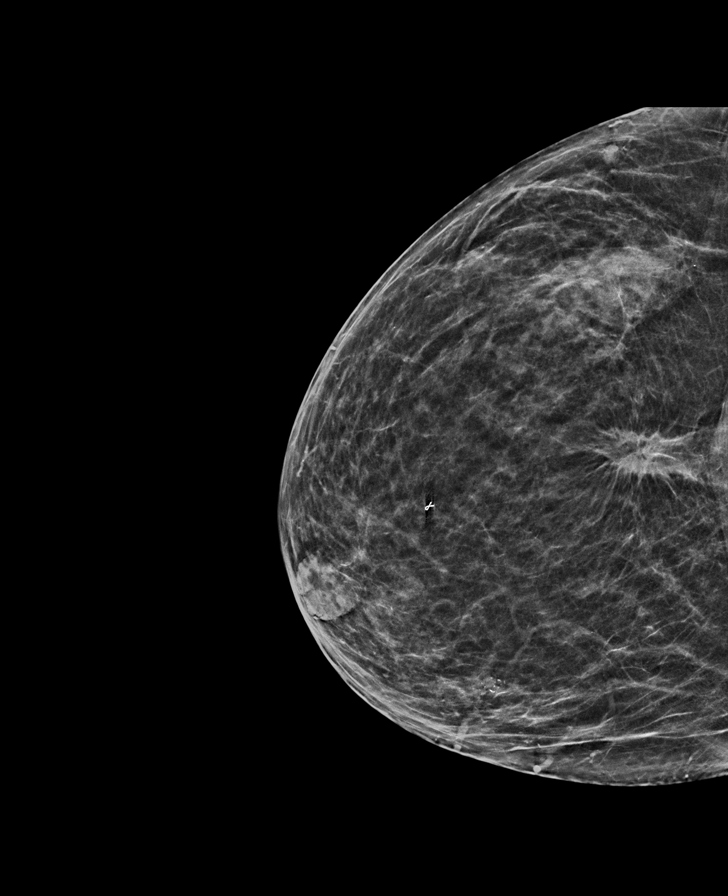

[R MLO synth-2D (2 of 2)]
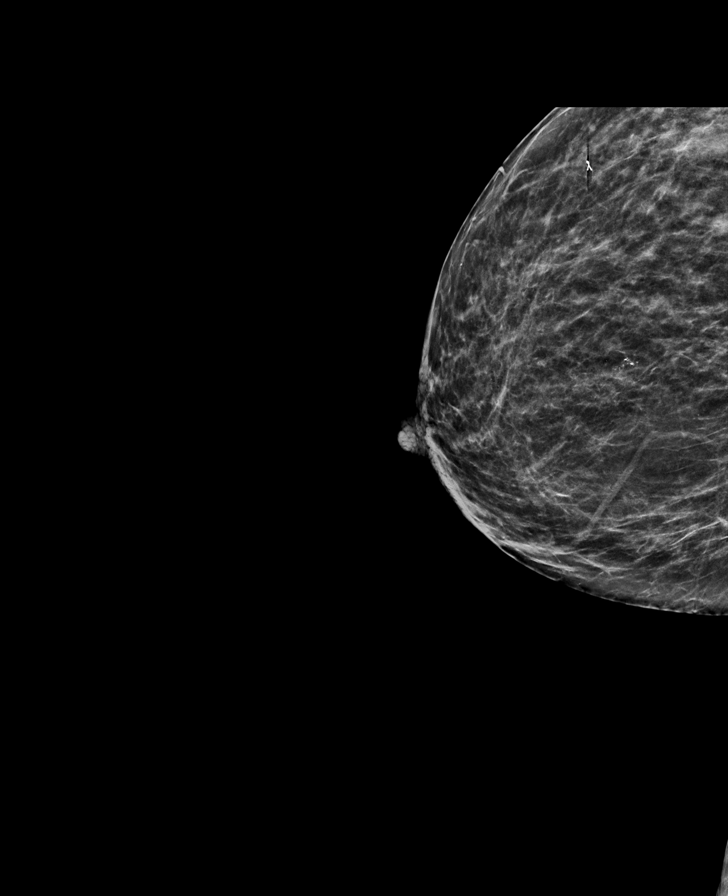

[8 of 39 positions shown; findings below may reference images not displayed]

ACR Breast Density Category b: There are scattered areas of
fibroglandular density.
FINDINGS: Interval post lumpectomy changes in the posterior aspect of the
upper-outer quadrant of the right breast with right axillary
surgical clips. Interval 8 x 7 x 5 mm group of pleomorphic
calcifications in the anterior aspect of the right breast in
approximately the 3 o'clock position. No findings elsewhere in
either breast suspicious for malignancy.

Mammographic images were processed with CAD.
IMPRESSION: 8 mm group of indeterminate calcifications in the 3 o'clock position
of the right breast.

RECOMMENDATION:
Stereotactic guided core needle biopsy of the 8 mm group
calcifications in the 3 position of right breast.

I have discussed the findings and recommendations with the patient.
If applicable, a reminder letter will be sent to the patient
regarding the next appointment.

BI-RADS CATEGORY  4: Suspicious.

## 2020-07-16 DIAGNOSIS — D509 Iron deficiency anemia, unspecified: Secondary | ICD-10-CM | POA: Diagnosis not present

## 2020-07-16 DIAGNOSIS — E1129 Type 2 diabetes mellitus with other diabetic kidney complication: Secondary | ICD-10-CM | POA: Diagnosis not present

## 2020-07-16 DIAGNOSIS — Z992 Dependence on renal dialysis: Secondary | ICD-10-CM | POA: Diagnosis not present

## 2020-07-16 DIAGNOSIS — D631 Anemia in chronic kidney disease: Secondary | ICD-10-CM | POA: Diagnosis not present

## 2020-07-16 DIAGNOSIS — N186 End stage renal disease: Secondary | ICD-10-CM | POA: Diagnosis not present

## 2020-07-16 DIAGNOSIS — N2581 Secondary hyperparathyroidism of renal origin: Secondary | ICD-10-CM | POA: Diagnosis not present

## 2020-07-19 DIAGNOSIS — N2581 Secondary hyperparathyroidism of renal origin: Secondary | ICD-10-CM | POA: Diagnosis not present

## 2020-07-19 DIAGNOSIS — Z992 Dependence on renal dialysis: Secondary | ICD-10-CM | POA: Diagnosis not present

## 2020-07-19 DIAGNOSIS — E1129 Type 2 diabetes mellitus with other diabetic kidney complication: Secondary | ICD-10-CM | POA: Diagnosis not present

## 2020-07-19 DIAGNOSIS — N186 End stage renal disease: Secondary | ICD-10-CM | POA: Diagnosis not present

## 2020-07-19 DIAGNOSIS — D631 Anemia in chronic kidney disease: Secondary | ICD-10-CM | POA: Diagnosis not present

## 2020-07-19 DIAGNOSIS — D509 Iron deficiency anemia, unspecified: Secondary | ICD-10-CM | POA: Diagnosis not present

## 2020-07-20 ENCOUNTER — Ambulatory Visit (INDEPENDENT_AMBULATORY_CARE_PROVIDER_SITE_OTHER): Payer: Medicare Other | Admitting: General Surgery

## 2020-07-20 ENCOUNTER — Other Ambulatory Visit: Payer: Self-pay

## 2020-07-20 ENCOUNTER — Encounter: Payer: Self-pay | Admitting: General Surgery

## 2020-07-20 VITALS — BP 148/87 | HR 73 | Temp 97.9°F | Resp 14 | Ht 62.0 in | Wt 164.0 lb

## 2020-07-20 DIAGNOSIS — Z09 Encounter for follow-up examination after completed treatment for conditions other than malignant neoplasm: Secondary | ICD-10-CM

## 2020-07-20 NOTE — Progress Notes (Signed)
Subjective:     Megan [name]  Patient here for postoperative visit, status post right partial mastectomy.  She is doing well. Objective:    BP (!) 148/87   Pulse 73   Temp 97.9 F (36.6 C) (Oral)   Resp 14   Ht 5\' 2"  (1.575 m)   Wt 164 lb (74.4 kg)   SpO2 96%   BMI 30.00 kg/m   General:  alert, cooperative and no distress  Right mastectomy incision healing well. Results Surgical pathology (Order [id number]) MyChart Results Release  MyChart Status: Pending Results Release  In Basket Actions   Reviewed  Result Note  View in In Basket   Surgical pathology Order: [id number] Status:  Edited Result - FINAL Visible to patient:  No (inaccessible in MyChart) Next appt:  09/07/2020 at 10:20 AM in Radiology ([facility])  0 Result Notes Component 7 d ago  SURGICAL PATHOLOGY SURGICAL PATHOLOGY  CASE: [id number]  PATIENT: Megan [name]  Surgical Pathology Report      Clinical History: recurrent right breast cancer      FINAL MICROSCOPIC DIAGNOSIS:   A. BREAST, RIGHT, LUMPECTOMY:  - Multifocal invasive ductal carcinoma, grade 3, largest spanning 1.2  cm.  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  - Invasive carcinoma is 1-2 mm from the medial margin focally and 2 mm  from the lateral margin focally.  - In situ carcinoma is <1 mm from the inferior margin (focally), lateral  margin (broadly), and medial margin (broadly).  - Biopsy site.  - See oncology table.   ONCOLOGY TABLE:   INVASIVE CARCINOMA OF THE BREAST: Resection   Procedure: Right breast lumpectomy  Specimen Laterality: Right  Tumor Size: Largest focus measures approximately 1.2 cm (4 blocks x 0.3  cm per block).  Histologic Type: Invasive ductal carcinoma  Histologic Grade:    Glandular (Acinar)/Tubular Differentiation: 2    Nuclear Pleomorphism: 3    Mitotic Rate: 3    Overall Grade: 3  Ductal Carcinoma In Situ: Extensive, high-grade with necrosis    Tumor Extension: Limited to breast parenchyma (required only if the  structures are present and involved)  Margins: Uninvolved by invasive carcinoma    Distance from closest margin (millimeters): 1-2 mm medial focally,  2 mm lateral focally.    Specify closest margin (required only if <3mm): See above  DCIS Margins (required only if DCIS is present in specimen): Uninvolved  by DCIS    Distance from closest margin (millimeters): <1 mm    Specify closest margin (required only if <2mm): inferior  (focally), medial (broadly), lateral (broadly).  Regional Lymph Nodes: No regional lymph nodes examined  Treatment Effect: No known presurgical therapy for this tumor.  Breast Biomarker Testing Performed on Previous Biopsy:  Will be  performed on largest focus.  Representative Tumor Block: A7  Pathologic Stage Classification (pTNM, AJCC 8th Edition): mpT1c, pN  unassigned  Comment: There are multiple foci of invasive carcinoma arising from  extensive DCIS. The largest focus (near the medial aspect) involves 4  blocks and is thus estimated at 1.2 cm for staging. There are several  smaller foci of invasive tumor arising from DCIS (sections without  margin, and lateral sections). Given the foci are arising from DCIS,  they are considered new primaries.   (v4.4.0.0)    GROSS DESCRIPTION:   Specimen type: Right breast lumpectomy, in formalin at 0958 hours  Size: 7.7 cm from superior to inferior, 5 cm from anterior to posterior,  ranges from 2.2 to 2.6 cm from medial to lateral.  Orientation: Short suture superior, long suture lateral. The specimen is  inked as follows: Green anterior, blue inferior, orange lateral, yellow  medial, black posterior, red superior.  Localized area: None. No clips identified on Faxitron scan.  Cut surface: There is a 6.3 x 3.6 x 2.5 cm area of pink-white diffusely  nodular and gritty tissue. A discrete mass is not identified.  Margins: The area  of diffusely nodular gritty tissue abuts portions of  inferior, anterior, medial and lateral margins.  Prognostic indicators: Not taken at gross  Block summary:  Blocks 1, 2 = nodular, gritty tissue, without margins  Blocks 3-5 = nodular, gritty tissue abutting inferior margin  Blocks 6-9 = nodular, gritty tissue abutting medial margin  Blocks 10, 11 = nodular, gritty tissue abutting lateral margin  Blocks 12, 13 = nodular, gritty tissue abutting anterior margin  Block 14 = superior margin nearest nodular, gritty tissue  Block 15 = posterior margin nearest nodular, gritty tissue   SW 07/14/2020      Final Diagnosis performed by Vicente Males, MD.  Electronically signed  07/15/2020  Technical component performed at [facility], [place]  [address] [place], [place] [address].  Professional component performed at [facility],  [place] [address] [place], [place] [address].  Immunohistochemistry Technical component (if applicable) wasperformed  at [facility]. [address], [place],  [place],  [address].  IMMUNOHISTOCHEMISTRY DISCLAIMER (if applicable):  Some of these immunohistochemical stains may have been developed and the  performance characteristics determine by [facility]. Some  may not have been cleared or approved by the U.S. Food and Drug  Administration. The FDA has determined that such clearance or approval  is not necessary. This test is used for clinical purposes. It should not  be regarded as investigational or for research. This laboratory is  certified under the [place]  (CLIA-88) as qualified to perform high complexity clinical laboratory  testing. The controls stained appropriately.   Resulting Agency [facility]      Specimen Collected: 07/13/20 07:46 Last Resulted: 07/15/20 12:56      Lab Flowsheet    Order Details    View Encounter     Lab and Collection Details    Routing    Result History       Linked Documents  View Image    Result Care Coordination   Patient Communication   Add Comments  Not seen Back to Top      Result Information  Status Priority Source  Edited Result - FINAL (07/15/2020 1256) Timed PATH Breast other  Authorizing Provider Information  Name: Aviva Signs, MD Fax: [phone]  Phone: [phone] Pager:   Status of Active Orders View All Orders From This Encounter Expected   Increase activity slowly [DGL87564 Custom] 07/13/20  Diet - low sodium heart healthy [DIET9 Custom] 07/13/20  All Reviewers List  [name], MD on 07/15/2020 14:56   Surgical pathology: Patient Communication   Add Comments  Not seen  Cervical Cancer Screening - Results and Follow-ups Selected result Result date Tests and Procedures Follow-ups   07/13/2020 Surgical pathology   Cervical Cancer Screening History Report View SmartLink Info  Surgical pathology (Order #[id number]) on 07/13/20   Patient aware of pathology results.     Assessment:    Doing well postoperatively.    Plan:   Patient will be following  up with Dr. Delton Coombes of oncology next month.  Follow-up here as needed.

## 2020-07-21 DIAGNOSIS — D631 Anemia in chronic kidney disease: Secondary | ICD-10-CM | POA: Diagnosis not present

## 2020-07-21 DIAGNOSIS — D509 Iron deficiency anemia, unspecified: Secondary | ICD-10-CM | POA: Diagnosis not present

## 2020-07-21 DIAGNOSIS — Z992 Dependence on renal dialysis: Secondary | ICD-10-CM | POA: Diagnosis not present

## 2020-07-21 DIAGNOSIS — N2581 Secondary hyperparathyroidism of renal origin: Secondary | ICD-10-CM | POA: Diagnosis not present

## 2020-07-21 DIAGNOSIS — N186 End stage renal disease: Secondary | ICD-10-CM | POA: Diagnosis not present

## 2020-07-21 DIAGNOSIS — E1129 Type 2 diabetes mellitus with other diabetic kidney complication: Secondary | ICD-10-CM | POA: Diagnosis not present

## 2020-07-23 DIAGNOSIS — Z992 Dependence on renal dialysis: Secondary | ICD-10-CM | POA: Diagnosis not present

## 2020-07-23 DIAGNOSIS — E1129 Type 2 diabetes mellitus with other diabetic kidney complication: Secondary | ICD-10-CM | POA: Diagnosis not present

## 2020-07-23 DIAGNOSIS — N2581 Secondary hyperparathyroidism of renal origin: Secondary | ICD-10-CM | POA: Diagnosis not present

## 2020-07-23 DIAGNOSIS — D509 Iron deficiency anemia, unspecified: Secondary | ICD-10-CM | POA: Diagnosis not present

## 2020-07-23 DIAGNOSIS — N186 End stage renal disease: Secondary | ICD-10-CM | POA: Diagnosis not present

## 2020-07-23 DIAGNOSIS — D631 Anemia in chronic kidney disease: Secondary | ICD-10-CM | POA: Diagnosis not present

## 2020-07-26 DIAGNOSIS — D631 Anemia in chronic kidney disease: Secondary | ICD-10-CM | POA: Diagnosis not present

## 2020-07-26 DIAGNOSIS — N186 End stage renal disease: Secondary | ICD-10-CM | POA: Diagnosis not present

## 2020-07-26 DIAGNOSIS — D509 Iron deficiency anemia, unspecified: Secondary | ICD-10-CM | POA: Diagnosis not present

## 2020-07-26 DIAGNOSIS — Z992 Dependence on renal dialysis: Secondary | ICD-10-CM | POA: Diagnosis not present

## 2020-07-26 DIAGNOSIS — N2581 Secondary hyperparathyroidism of renal origin: Secondary | ICD-10-CM | POA: Diagnosis not present

## 2020-07-26 DIAGNOSIS — E1129 Type 2 diabetes mellitus with other diabetic kidney complication: Secondary | ICD-10-CM | POA: Diagnosis not present

## 2020-07-28 DIAGNOSIS — N186 End stage renal disease: Secondary | ICD-10-CM | POA: Diagnosis not present

## 2020-07-28 DIAGNOSIS — D509 Iron deficiency anemia, unspecified: Secondary | ICD-10-CM | POA: Diagnosis not present

## 2020-07-28 DIAGNOSIS — E1129 Type 2 diabetes mellitus with other diabetic kidney complication: Secondary | ICD-10-CM | POA: Diagnosis not present

## 2020-07-28 DIAGNOSIS — D631 Anemia in chronic kidney disease: Secondary | ICD-10-CM | POA: Diagnosis not present

## 2020-07-28 DIAGNOSIS — Z992 Dependence on renal dialysis: Secondary | ICD-10-CM | POA: Diagnosis not present

## 2020-07-28 DIAGNOSIS — N2581 Secondary hyperparathyroidism of renal origin: Secondary | ICD-10-CM | POA: Diagnosis not present

## 2020-07-29 DIAGNOSIS — N2581 Secondary hyperparathyroidism of renal origin: Secondary | ICD-10-CM | POA: Diagnosis not present

## 2020-07-29 DIAGNOSIS — Z992 Dependence on renal dialysis: Secondary | ICD-10-CM | POA: Diagnosis not present

## 2020-07-29 DIAGNOSIS — N186 End stage renal disease: Secondary | ICD-10-CM | POA: Diagnosis not present

## 2020-07-29 DIAGNOSIS — D509 Iron deficiency anemia, unspecified: Secondary | ICD-10-CM | POA: Diagnosis not present

## 2020-07-29 DIAGNOSIS — E1129 Type 2 diabetes mellitus with other diabetic kidney complication: Secondary | ICD-10-CM | POA: Diagnosis not present

## 2020-07-29 DIAGNOSIS — D631 Anemia in chronic kidney disease: Secondary | ICD-10-CM | POA: Diagnosis not present

## 2020-08-02 DIAGNOSIS — D631 Anemia in chronic kidney disease: Secondary | ICD-10-CM | POA: Diagnosis not present

## 2020-08-02 DIAGNOSIS — N186 End stage renal disease: Secondary | ICD-10-CM | POA: Diagnosis not present

## 2020-08-02 DIAGNOSIS — E1129 Type 2 diabetes mellitus with other diabetic kidney complication: Secondary | ICD-10-CM | POA: Diagnosis not present

## 2020-08-02 DIAGNOSIS — D509 Iron deficiency anemia, unspecified: Secondary | ICD-10-CM | POA: Diagnosis not present

## 2020-08-02 DIAGNOSIS — Z992 Dependence on renal dialysis: Secondary | ICD-10-CM | POA: Diagnosis not present

## 2020-08-02 DIAGNOSIS — N2581 Secondary hyperparathyroidism of renal origin: Secondary | ICD-10-CM | POA: Diagnosis not present

## 2020-08-05 ENCOUNTER — Other Ambulatory Visit: Payer: Self-pay | Admitting: Family Medicine

## 2020-08-06 DIAGNOSIS — D631 Anemia in chronic kidney disease: Secondary | ICD-10-CM | POA: Diagnosis not present

## 2020-08-06 DIAGNOSIS — D509 Iron deficiency anemia, unspecified: Secondary | ICD-10-CM | POA: Diagnosis not present

## 2020-08-06 DIAGNOSIS — N186 End stage renal disease: Secondary | ICD-10-CM | POA: Diagnosis not present

## 2020-08-06 DIAGNOSIS — Z992 Dependence on renal dialysis: Secondary | ICD-10-CM | POA: Diagnosis not present

## 2020-08-06 DIAGNOSIS — E1129 Type 2 diabetes mellitus with other diabetic kidney complication: Secondary | ICD-10-CM | POA: Diagnosis not present

## 2020-08-06 DIAGNOSIS — N2581 Secondary hyperparathyroidism of renal origin: Secondary | ICD-10-CM | POA: Diagnosis not present

## 2020-08-09 DIAGNOSIS — Z992 Dependence on renal dialysis: Secondary | ICD-10-CM | POA: Diagnosis not present

## 2020-08-09 DIAGNOSIS — D509 Iron deficiency anemia, unspecified: Secondary | ICD-10-CM | POA: Diagnosis not present

## 2020-08-09 DIAGNOSIS — D631 Anemia in chronic kidney disease: Secondary | ICD-10-CM | POA: Diagnosis not present

## 2020-08-09 DIAGNOSIS — N2581 Secondary hyperparathyroidism of renal origin: Secondary | ICD-10-CM | POA: Diagnosis not present

## 2020-08-09 DIAGNOSIS — E1129 Type 2 diabetes mellitus with other diabetic kidney complication: Secondary | ICD-10-CM | POA: Diagnosis not present

## 2020-08-09 DIAGNOSIS — N186 End stage renal disease: Secondary | ICD-10-CM | POA: Diagnosis not present

## 2020-08-11 DIAGNOSIS — Z992 Dependence on renal dialysis: Secondary | ICD-10-CM | POA: Diagnosis not present

## 2020-08-11 DIAGNOSIS — D631 Anemia in chronic kidney disease: Secondary | ICD-10-CM | POA: Diagnosis not present

## 2020-08-11 DIAGNOSIS — N2581 Secondary hyperparathyroidism of renal origin: Secondary | ICD-10-CM | POA: Diagnosis not present

## 2020-08-11 DIAGNOSIS — D509 Iron deficiency anemia, unspecified: Secondary | ICD-10-CM | POA: Diagnosis not present

## 2020-08-11 DIAGNOSIS — E1129 Type 2 diabetes mellitus with other diabetic kidney complication: Secondary | ICD-10-CM | POA: Diagnosis not present

## 2020-08-11 DIAGNOSIS — N186 End stage renal disease: Secondary | ICD-10-CM | POA: Diagnosis not present

## 2020-08-13 DIAGNOSIS — N186 End stage renal disease: Secondary | ICD-10-CM | POA: Diagnosis not present

## 2020-08-13 DIAGNOSIS — D631 Anemia in chronic kidney disease: Secondary | ICD-10-CM | POA: Diagnosis not present

## 2020-08-13 DIAGNOSIS — D509 Iron deficiency anemia, unspecified: Secondary | ICD-10-CM | POA: Diagnosis not present

## 2020-08-13 DIAGNOSIS — Z992 Dependence on renal dialysis: Secondary | ICD-10-CM | POA: Diagnosis not present

## 2020-08-13 DIAGNOSIS — N2581 Secondary hyperparathyroidism of renal origin: Secondary | ICD-10-CM | POA: Diagnosis not present

## 2020-08-13 DIAGNOSIS — E1129 Type 2 diabetes mellitus with other diabetic kidney complication: Secondary | ICD-10-CM | POA: Diagnosis not present

## 2020-08-14 DIAGNOSIS — N040 Nephrotic syndrome with minor glomerular abnormality: Secondary | ICD-10-CM | POA: Diagnosis not present

## 2020-08-14 DIAGNOSIS — N186 End stage renal disease: Secondary | ICD-10-CM | POA: Diagnosis not present

## 2020-08-14 DIAGNOSIS — Z992 Dependence on renal dialysis: Secondary | ICD-10-CM | POA: Diagnosis not present

## 2020-08-17 ENCOUNTER — Other Ambulatory Visit (HOSPITAL_COMMUNITY): Payer: Self-pay | Admitting: Hematology

## 2020-08-17 DIAGNOSIS — R928 Other abnormal and inconclusive findings on diagnostic imaging of breast: Secondary | ICD-10-CM

## 2020-08-22 ENCOUNTER — Other Ambulatory Visit: Payer: Self-pay | Admitting: Family Medicine

## 2020-08-31 ENCOUNTER — Encounter (HOSPITAL_COMMUNITY): Payer: Self-pay | Admitting: *Deleted

## 2020-08-31 NOTE — Progress Notes (Signed)
I received a message from mammography stating patient has had bilateral mastectomies and does not require annual mammogram surveillance.  She can just have ultrasounds in the future if she has any problems.    Provider aware.

## 2020-09-01 DIAGNOSIS — E039 Hypothyroidism, unspecified: Secondary | ICD-10-CM | POA: Diagnosis not present

## 2020-09-01 DIAGNOSIS — E1129 Type 2 diabetes mellitus with other diabetic kidney complication: Secondary | ICD-10-CM | POA: Diagnosis not present

## 2020-09-01 DIAGNOSIS — E119 Type 2 diabetes mellitus without complications: Secondary | ICD-10-CM | POA: Diagnosis not present

## 2020-09-07 ENCOUNTER — Inpatient Hospital Stay (HOSPITAL_COMMUNITY): Admission: RE | Admit: 2020-09-07 | Payer: Medicare Other | Source: Ambulatory Visit

## 2020-09-07 ENCOUNTER — Inpatient Hospital Stay (HOSPITAL_COMMUNITY): Payer: Medicare Other | Attending: Hematology

## 2020-09-07 ENCOUNTER — Other Ambulatory Visit (HOSPITAL_COMMUNITY): Payer: Medicare Other

## 2020-09-07 ENCOUNTER — Ambulatory Visit (HOSPITAL_COMMUNITY): Payer: Medicare Other | Admitting: Hematology

## 2020-09-07 ENCOUNTER — Other Ambulatory Visit: Payer: Self-pay

## 2020-09-07 DIAGNOSIS — R0602 Shortness of breath: Secondary | ICD-10-CM | POA: Diagnosis not present

## 2020-09-07 DIAGNOSIS — Z9221 Personal history of antineoplastic chemotherapy: Secondary | ICD-10-CM | POA: Insufficient documentation

## 2020-09-07 DIAGNOSIS — R197 Diarrhea, unspecified: Secondary | ICD-10-CM | POA: Insufficient documentation

## 2020-09-07 DIAGNOSIS — Z170 Estrogen receptor positive status [ER+]: Secondary | ICD-10-CM | POA: Insufficient documentation

## 2020-09-07 DIAGNOSIS — Z9011 Acquired absence of right breast and nipple: Secondary | ICD-10-CM | POA: Diagnosis not present

## 2020-09-07 DIAGNOSIS — Z992 Dependence on renal dialysis: Secondary | ICD-10-CM | POA: Diagnosis not present

## 2020-09-07 DIAGNOSIS — G629 Polyneuropathy, unspecified: Secondary | ICD-10-CM | POA: Insufficient documentation

## 2020-09-07 DIAGNOSIS — Z79811 Long term (current) use of aromatase inhibitors: Secondary | ICD-10-CM | POA: Insufficient documentation

## 2020-09-07 DIAGNOSIS — R200 Anesthesia of skin: Secondary | ICD-10-CM | POA: Diagnosis not present

## 2020-09-07 DIAGNOSIS — Z79899 Other long term (current) drug therapy: Secondary | ICD-10-CM | POA: Insufficient documentation

## 2020-09-07 DIAGNOSIS — Z7982 Long term (current) use of aspirin: Secondary | ICD-10-CM | POA: Diagnosis not present

## 2020-09-07 DIAGNOSIS — C50211 Malignant neoplasm of upper-inner quadrant of right female breast: Secondary | ICD-10-CM | POA: Insufficient documentation

## 2020-09-07 DIAGNOSIS — M8580 Other specified disorders of bone density and structure, unspecified site: Secondary | ICD-10-CM | POA: Diagnosis not present

## 2020-09-07 DIAGNOSIS — R079 Chest pain, unspecified: Secondary | ICD-10-CM | POA: Diagnosis not present

## 2020-09-07 DIAGNOSIS — N186 End stage renal disease: Secondary | ICD-10-CM | POA: Diagnosis not present

## 2020-09-07 LAB — CBC WITH DIFFERENTIAL/PLATELET
Abs Immature Granulocytes: 0.03 10*3/uL (ref 0.00–0.07)
Basophils Absolute: 0.1 10*3/uL (ref 0.0–0.1)
Basophils Relative: 1 %
Eosinophils Absolute: 0.4 10*3/uL (ref 0.0–0.5)
Eosinophils Relative: 5 %
HCT: 38.9 % (ref 36.0–46.0)
Hemoglobin: 11.7 g/dL — ABNORMAL LOW (ref 12.0–15.0)
Immature Granulocytes: 0 %
Lymphocytes Relative: 22 %
Lymphs Abs: 1.7 10*3/uL (ref 0.7–4.0)
MCH: 28.4 pg (ref 26.0–34.0)
MCHC: 30.1 g/dL (ref 30.0–36.0)
MCV: 94.4 fL (ref 80.0–100.0)
Monocytes Absolute: 0.8 10*3/uL (ref 0.1–1.0)
Monocytes Relative: 10 %
Neutro Abs: 4.9 10*3/uL (ref 1.7–7.7)
Neutrophils Relative %: 62 %
Platelets: 308 10*3/uL (ref 150–400)
RBC: 4.12 MIL/uL (ref 3.87–5.11)
RDW: 18.8 % — ABNORMAL HIGH (ref 11.5–15.5)
WBC: 7.9 10*3/uL (ref 4.0–10.5)
nRBC: 0 % (ref 0.0–0.2)

## 2020-09-07 LAB — COMPREHENSIVE METABOLIC PANEL
ALT: 20 U/L (ref 0–44)
AST: 17 U/L (ref 15–41)
Albumin: 3.6 g/dL (ref 3.5–5.0)
Alkaline Phosphatase: 83 U/L (ref 38–126)
Anion gap: 11 (ref 5–15)
BUN: 42 mg/dL — ABNORMAL HIGH (ref 6–20)
CO2: 27 mmol/L (ref 22–32)
Calcium: 10.5 mg/dL — ABNORMAL HIGH (ref 8.9–10.3)
Chloride: 101 mmol/L (ref 98–111)
Creatinine, Ser: 7.64 mg/dL — ABNORMAL HIGH (ref 0.44–1.00)
GFR, Estimated: 6 mL/min — ABNORMAL LOW (ref >60)
Glucose, Bld: 112 mg/dL — ABNORMAL HIGH (ref 70–99)
Potassium: 3.1 mmol/L — ABNORMAL LOW (ref 3.5–5.1)
Sodium: 139 mmol/L (ref 135–145)
Total Bilirubin: 0.6 mg/dL (ref 0.3–1.2)
Total Protein: 7.5 g/dL (ref 6.5–8.1)

## 2020-09-09 ENCOUNTER — Encounter (HOSPITAL_COMMUNITY): Payer: Self-pay | Admitting: Hematology

## 2020-09-09 ENCOUNTER — Other Ambulatory Visit: Payer: Self-pay

## 2020-09-09 ENCOUNTER — Inpatient Hospital Stay (HOSPITAL_BASED_OUTPATIENT_CLINIC_OR_DEPARTMENT_OTHER): Payer: Medicare Other | Admitting: Hematology

## 2020-09-09 VITALS — BP 153/74 | HR 73 | Temp 97.3°F | Resp 18 | Wt 158.6 lb

## 2020-09-09 DIAGNOSIS — Z170 Estrogen receptor positive status [ER+]: Secondary | ICD-10-CM

## 2020-09-09 DIAGNOSIS — C50211 Malignant neoplasm of upper-inner quadrant of right female breast: Secondary | ICD-10-CM | POA: Diagnosis not present

## 2020-09-09 DIAGNOSIS — Z79811 Long term (current) use of aromatase inhibitors: Secondary | ICD-10-CM | POA: Diagnosis not present

## 2020-09-09 DIAGNOSIS — R0602 Shortness of breath: Secondary | ICD-10-CM | POA: Diagnosis not present

## 2020-09-09 DIAGNOSIS — R079 Chest pain, unspecified: Secondary | ICD-10-CM | POA: Diagnosis not present

## 2020-09-09 NOTE — Progress Notes (Signed)
[place] [address], [place] [address]   Patient Care Team: [name], MD as PCP - General (Family Medicine) [name], [name], MD as PCP - Cardiology (Cardiology) [name] [name], MD (Gastroenterology) [name], MD as Consulting Physician (Cardiology) [name], RN as Oncology Nurse Navigator (Oncology)  SUMMARY OF ONCOLOGIC HISTORY: Oncology History  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[place])  09/18/2018 Initial Diagnosis   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[place])   09/26/2018 Cancer Staging   Staging form: Breast, AJCC 8th Edition - Clinical stage from 09/26/2018: Stage IB (cT1c, cN0, cM0, G3, ER+, PR-, HER2-) - Signed by Derek Jack, MD on 09/26/2018   10/30/2018 -  Chemotherapy   The patient had DOXOrubicin (ADRIAMYCIN) chemo injection 108 mg, 60 mg/m2 = 108 mg, Intravenous,  Once, 4 of 4 cycles Administration: 108 mg (10/30/2018), 108 mg (11/20/2018), 108 mg (12/11/2018), 108 mg (01/01/2019) palonosetron (ALOXI) injection 0.25 mg, 0.25 mg, Intravenous,  Once, 4 of 4 cycles Administration: 0.25 mg (10/30/2018), 0.25 mg (11/20/2018), 0.25 mg (12/11/2018), 0.25 mg (01/01/2019) pegfilgrastim (NEULASTA ONPRO KIT) injection 6 mg, 6 mg, Subcutaneous, Once, 4 of 4 cycles Administration: 6 mg (10/30/2018), 6 mg (11/20/2018), 6 mg (12/11/2018), 6 mg (01/01/2019) cyclophosphamide (CYTOXAN) 820 mg in sodium chloride 0.9 % 250 mL chemo infusion, 450 mg/m2 = 820 mg (75 % of original dose 600 mg/m2), Intravenous,  Once, 4 of 4 cycles Dose modification: 450 mg/m2 (75 % of original dose 600 mg/m2, Cycle 1, Reason: Other (see comments), Comment: hemodialysis) Administration: 820 mg (10/30/2018), 820 mg (11/20/2018), 820 mg (12/11/2018), 820 mg (01/01/2019) fosaprepitant (EMEND) 150 mg, dexamethasone (DECADRON) 12 mg in sodium chloride 0.9 % 145 mL IVPB, , Intravenous,  Once, 4 of 4  cycles Administration:  (10/30/2018),  (11/20/2018),  (12/11/2018),  (01/01/2019)  for chemotherapy treatment.      CHIEF COMPLIANT: Follow-up for right breast cancer   INTERVAL HISTORY: Megan [name] is a 60 y.o. female here today for follow up of her right breast cancer. Her last visit was on 03/02/2020.   Today she is accompanied by her husband and she reports feeling fair. She had a right lumpectomy by Dr. Arnoldo Morale on 07/13/2020 and a left simple mastectomy on 06/15/2020. Her appetite and energy levels are decreased since having the surgery and she continues having pain over the right mastectomy site. She continues having intermittent burning sensation in the soles of her feet. She is taking Arimidex daily and tolerating it well.  She continues going to dialysis.   REVIEW OF SYSTEMS:   Review of Systems  Constitutional: Positive for appetite change (depleted) and fatigue (50%).  Respiratory: Positive for shortness of breath (at rest).   Cardiovascular: Positive for chest pain (10/10 over R mastectomy site).  Gastrointestinal: Positive for diarrhea.  Neurological: Positive for numbness (intermittent burning sensation in soles of feet).  All other systems reviewed and are negative.   I have reviewed the past medical history, past surgical history, social history and family history with the patient and they are unchanged from previous note.   ALLERGIES:   has No Known Allergies.   MEDICATIONS:  Current Outpatient Medications  Medication Sig Dispense Refill  . Darbepoetin Alfa (ARANESP, ALBUMIN FREE, IJ) Darbepoetin Alfa (Aranesp)    . amLODipine (NORVASC) 10 MG tablet TAKE 2 TABLETS(20 MG) BY MOUTH AT BEDTIME 60 tablet 3  . anastrozole (ARIMIDEX) 1 MG tablet TAKE 1  TABLET(1 MG) BY MOUTH DAILY (Patient taking differently: Take 1 mg by mouth daily. ) 30 tablet 4  . aspirin EC 81 MG tablet Take 81 mg by mouth daily.    Marland Kitchen atorvastatin (LIPITOR) 20 MG tablet Take 1 tablet (20  mg total) by mouth daily. (Patient taking differently: Take 20 mg by mouth at bedtime. ) 90 tablet 3  . B Complex-C-Zn-Folic Acid (DIALYVITE 800-ZINC 15) 0.8 MG TABS Take 1 tablet by mouth daily.    . cinacalcet (SENSIPAR) 30 MG tablet Take 2 tablets (60 mg total) by mouth daily. (Patient taking differently: Take 30 mg by mouth daily with supper. ) 90 tablet 0  . DULoxetine (CYMBALTA) 30 MG capsule TAKE 1 CAPSULE(30 MG) BY MOUTH DAILY 90 capsule 1  . gabapentin (NEURONTIN) 300 MG capsule TAKE 1 CAPSULE(300 MG) BY MOUTH AT BEDTIME (Patient taking differently: Take 300 mg by mouth at bedtime. ) 90 capsule 1  . HYDROcodone-acetaminophen (NORCO) 5-325 MG tablet Take 1 tablet by mouth every 6 (six) hours as needed for moderate pain. 20 tablet 0  . lanthanum (FOSRENOL) 1000 MG chewable tablet Chew 2,000-3,000 mg by mouth See admin instructions. Take 3000 mg by mouth with meals and 2000 mg with snacks    . lidocaine-prilocaine (EMLA) cream Apply 1 application topically every Monday, Wednesday, and Friday with hemodialysis.     Marland Kitchen nebivolol (BYSTOLIC) 10 MG tablet Take 1 tablet (10 mg total) by mouth in the morning and at bedtime. 180 tablet 3  . ondansetron (ZOFRAN) 4 MG tablet Take 1 tablet (4 mg total) by mouth every 8 (eight) hours as needed for nausea or vomiting. 20 tablet 1  . pantoprazole (PROTONIX) 40 MG tablet TAKE 1 TABLET(40 MG) BY MOUTH DAILY (Patient taking differently: Take 40 mg by mouth daily. ) 90 tablet 3   No current facility-administered medications for this visit.     PHYSICAL EXAMINATION: Performance status (ECOG): 1 - Symptomatic but completely ambulatory  Vitals:   09/09/20 1146  BP: (!) 153/74  Pulse: 73  Resp: 18  Temp: (!) 97.3 F (36.3 C)  SpO2: 100%   Wt Readings from Last 3 Encounters:  09/09/20 158 lb 9.6 oz (71.9 kg)  07/20/20 164 lb (74.4 kg)  07/06/20 162 lb (73.5 kg)   Physical Exam Vitals reviewed.  Constitutional:      Appearance: Normal appearance.   Cardiovascular:     Rate and Rhythm: Normal rate and regular rhythm.     Pulses: Normal pulses.     Heart sounds: Normal heart sounds.  Pulmonary:     Effort: Pulmonary effort is normal.     Breath sounds: Normal breath sounds.  Chest:  Breasts:     Right: Tenderness (in upper pectoral resion) present.     Left: Tenderness (in upper pectoral region) present.    Neurological:     General: No focal deficit present.     Mental Status: She is alert and oriented to person, place, and time.  Psychiatric:        Mood and Affect: Mood normal.        Behavior: Behavior normal.     Breast Exam Chaperone: Drue Second, MD     LABORATORY DATA:  I have reviewed the data as listed CMP Latest Ref Rng & Units 09/07/2020 07/13/2020 07/03/2020  Glucose 70 - 99 mg/dL 179(E) 178(N) 754(W)  BUN 6 - 20 mg/dL 37(K) 23(W) 17(I)  Creatinine 0.44 - 1.00 mg/dL 0.91(U) 6.81(C) 6.19(E)  Sodium 135 -  145 mmol/L 139 139 138  Potassium 3.5 - 5.1 mmol/L 3.1(L) 3.5 3.4(L)  Chloride 98 - 111 mmol/L 101 101 106  CO2 22 - 32 mmol/L 27 - 22  Calcium 8.9 - 10.3 mg/dL 10.5(H) - 10.0  Total Protein 6.5 - 8.1 g/dL 7.5 - 8.1  Total Bilirubin 0.3 - 1.2 mg/dL 0.6 - 0.6  Alkaline Phos 38 - 126 U/L 83 - 103  AST 15 - 41 U/L 17 - 20  ALT 0 - 44 U/L 20 - 18   No results found for: ACZ660 Lab Results  Component Value Date   WBC 7.9 09/07/2020   HGB 11.7 (L) 09/07/2020   HCT 38.9 09/07/2020   MCV 94.4 09/07/2020   PLT 308 09/07/2020   NEUTROABS 4.9 09/07/2020   Surgical pathology [phone]) on 07/13/2020: Right breast lumpectomy: multifocal invasive ductal carcinoma, grade 3.  ASSESSMENT:  1. Stage Ib (T1CN0) right breast cancer, ER positive, PR and HER-2 negative: -Right lumpectomy and sentinel lymph node biopsy on 09/18/2018 shows 1.5 cm IDC, grade 3, associated with high-grade DCIS, negative margins, 0/3 lymph nodes positive, ER 50%, PR negative and HER-2 negative, Ki-67 60%. -Oncotype DX  recurrence score 47.  Distant recurrence at 9 years with tamoxifen alone is 36%.  Absolute chemotherapy benefit is more than 15%. -Adjuvant chemotherapy with 4 cycles of AC from 10/30/2018 through 01/01/2019. -She was started on anastrozole in June 2020. -Mammogram on 09/02/2019 showed calcifications in the posterior aspect of upper outer quadrant of the right breast. -Right mastectomy on 02/18/2020 shows high-grade DCIS, 1.5 cm, no invasive carcinoma, resection margins negative.  0/11 lymph nodes.  ER 30% positive, PR negative. -Left simple mastectomy on 06/15/2020 was benign.  Right breast biopsy shows microscopic focus of invasive ductal carcinoma in the background of extensive high-grade DCIS. -Right breast lumpectomy on 07/13/2020 shows multifocal invasive ductal carcinoma, grade 3, largest measuring 1.2 cm.  Extensive high-grade DCIS with necrosis.  Margins are free.  PT1CNX.  There are multiple foci of invasive carcinoma arising from extensive DCIS.  Several small foci of invasive tumor arising from DCIS indicating new primaries.  2. Osteopenia: -Bone density on 01/20/2019 shows T score -1.9.   PLAN:  1.  Recurrent right breast cancer: -She is continuing to take anastrozole. -Reviewed pathology from 06/15/2020 and 07/13/2020. -She had recurrence of invasive ductal carcinoma, grade 3 in the right breast. -Recommend ER/PR/HER-2 testing on the specimen from 07/13/2020. -Recommend staging PET CT scan. -Patient has some tenderness/pain on bilateral mastectomy sites. -She will come back after the PET scan to discuss further plan of action.  2. Osteopenia: -Continue calcium and vitamin D. -Plan to repeat DEXA scan in 2 years.  3. ESRD on HD: -She will continue hemodialysis under the direction of Dr. Marval Regal.  4. Neuropathy: -Neuropathy of unknown etiology with burning sensation in the bottom of the feet is stable.  5.  Mild hypercalcemia: -Calcium is elevated at 10.5 today. -She  reports not taking any calcium or vitamin D supplements. -Likely related to her ESRD.  We will closely monitor.    No orders of the defined types were placed in this encounter.  The patient has a good understanding of the overall plan. she agrees with it. she will call with any problems that may develop before the next visit here. Total time spent is 40 minutes with more than 50% of the time spent face-to-face reviewing medical records, discussing new diagnosis, further plan, counseling and coordination of care.   Derek Jack,  MD [place] [phone]   I, Milinda Antis, am acting as a scribe for Dr. Sanda Linger.  I, Derek Jack MD, have reviewed the above documentation for accuracy and completeness, and I agree with the above.

## 2020-09-09 NOTE — Progress Notes (Signed)
ER, PR and HER-2 requested per Dr. Delton Coombes

## 2020-09-09 NOTE — Patient Instructions (Signed)
[place] at [facility] Discharge Instructions  You were seen today by Dr. Delton Coombes. He went over your recent results. You will be scheduled for a PET scan of your body before your next visit. Dr. Delton Coombes will see you back after the PET scan for labs and follow up.   Thank you for choosing [place] at [facility] to provide your oncology and hematology care.  To afford each patient quality time with our provider, please arrive at least 15 minutes before your scheduled appointment time.   If you have a lab appointment with the [place] please come in thru the Main Entrance and check in at the main information desk  You need to re-schedule your appointment should you arrive 10 or more minutes late.  We strive to give you quality time with our providers, and arriving late affects you and other patients whose appointments are after yours.  Also, if you no show three or more times for appointments you may be dismissed from the clinic at the providers discretion.     Again, thank you for choosing [facility].  Our hope is that these requests will decrease the amount of time that you wait before being seen by our physicians.       _____________________________________________________________  Should you have questions after your visit to [facility], please contact our office at [phone] between the hours of 8:00 a.m. and 4:30 p.m.  Voicemails left after 4:00 p.m. will not be returned until the following business day.  For prescription refill requests, have your pharmacy contact our office and allow 72 hours.    Cancer Center Support Programs:   > Cancer Support Group  2nd Tuesday of the month 1pm-2pm, Journey Room

## 2020-09-13 LAB — SURGICAL PATHOLOGY

## 2020-09-14 ENCOUNTER — Ambulatory Visit (HOSPITAL_COMMUNITY)
Admission: RE | Admit: 2020-09-14 | Discharge: 2020-09-14 | Disposition: A | Discharge status: Home / Self Care | Payer: Medicare Other | Source: Ambulatory Visit | Attending: Hematology | Admitting: Hematology

## 2020-09-14 ENCOUNTER — Other Ambulatory Visit: Payer: Self-pay

## 2020-09-14 DIAGNOSIS — N2581 Secondary hyperparathyroidism of renal origin: Secondary | ICD-10-CM | POA: Diagnosis not present

## 2020-09-14 DIAGNOSIS — Z170 Estrogen receptor positive status [ER+]: Secondary | ICD-10-CM | POA: Insufficient documentation

## 2020-09-14 DIAGNOSIS — R918 Other nonspecific abnormal finding of lung field: Secondary | ICD-10-CM | POA: Insufficient documentation

## 2020-09-14 DIAGNOSIS — N186 End stage renal disease: Secondary | ICD-10-CM | POA: Diagnosis not present

## 2020-09-14 DIAGNOSIS — C50211 Malignant neoplasm of upper-inner quadrant of right female breast: Secondary | ICD-10-CM | POA: Diagnosis not present

## 2020-09-14 DIAGNOSIS — I700 Atherosclerosis of aorta: Secondary | ICD-10-CM | POA: Diagnosis not present

## 2020-09-14 DIAGNOSIS — Z9011 Acquired absence of right breast and nipple: Secondary | ICD-10-CM | POA: Insufficient documentation

## 2020-09-14 DIAGNOSIS — Q6101 Congenital single renal cyst: Secondary | ICD-10-CM | POA: Insufficient documentation

## 2020-09-14 DIAGNOSIS — N040 Nephrotic syndrome with minor glomerular abnormality: Secondary | ICD-10-CM | POA: Diagnosis not present

## 2020-09-14 DIAGNOSIS — D509 Iron deficiency anemia, unspecified: Secondary | ICD-10-CM | POA: Diagnosis not present

## 2020-09-14 DIAGNOSIS — N200 Calculus of kidney: Secondary | ICD-10-CM | POA: Insufficient documentation

## 2020-09-14 DIAGNOSIS — E1129 Type 2 diabetes mellitus with other diabetic kidney complication: Secondary | ICD-10-CM | POA: Diagnosis not present

## 2020-09-14 DIAGNOSIS — Z992 Dependence on renal dialysis: Secondary | ICD-10-CM | POA: Diagnosis not present

## 2020-09-14 DIAGNOSIS — C50911 Malignant neoplasm of unspecified site of right female breast: Secondary | ICD-10-CM | POA: Diagnosis not present

## 2020-09-14 DIAGNOSIS — E119 Type 2 diabetes mellitus without complications: Secondary | ICD-10-CM | POA: Diagnosis not present

## 2020-09-14 LAB — GLUCOSE, CAPILLARY: Glucose-Capillary: 97 mg/dL (ref 70–99)

## 2020-09-14 MED ORDER — FLUDEOXYGLUCOSE F - 18 (FDG) INJECTION
8.9500 | Freq: Once | INTRAVENOUS | Status: AC | PRN
  Administered 2020-09-14: 8.95 via INTRAVENOUS

## 2020-09-15 DIAGNOSIS — E1129 Type 2 diabetes mellitus with other diabetic kidney complication: Secondary | ICD-10-CM | POA: Diagnosis not present

## 2020-09-15 DIAGNOSIS — E119 Type 2 diabetes mellitus without complications: Secondary | ICD-10-CM | POA: Diagnosis not present

## 2020-09-15 DIAGNOSIS — N186 End stage renal disease: Secondary | ICD-10-CM | POA: Diagnosis not present

## 2020-09-15 DIAGNOSIS — Z992 Dependence on renal dialysis: Secondary | ICD-10-CM | POA: Diagnosis not present

## 2020-09-15 DIAGNOSIS — D509 Iron deficiency anemia, unspecified: Secondary | ICD-10-CM | POA: Diagnosis not present

## 2020-09-15 DIAGNOSIS — Z23 Encounter for immunization: Secondary | ICD-10-CM | POA: Diagnosis not present

## 2020-09-15 DIAGNOSIS — N2581 Secondary hyperparathyroidism of renal origin: Secondary | ICD-10-CM | POA: Diagnosis not present

## 2020-09-16 ENCOUNTER — Encounter (HOSPITAL_COMMUNITY): Payer: Self-pay | Admitting: Hematology

## 2020-09-16 ENCOUNTER — Other Ambulatory Visit: Payer: Self-pay

## 2020-09-16 ENCOUNTER — Inpatient Hospital Stay (HOSPITAL_COMMUNITY): Payer: Medicare Other | Attending: Hematology | Admitting: Hematology

## 2020-09-16 ENCOUNTER — Other Ambulatory Visit (HOSPITAL_COMMUNITY): Payer: Self-pay | Admitting: Hematology

## 2020-09-16 ENCOUNTER — Ambulatory Visit (HOSPITAL_COMMUNITY): Payer: Medicare Other | Admitting: Hematology

## 2020-09-16 VITALS — BP 144/75 | HR 81 | Temp 98.0°F | Resp 16 | Wt 157.9 lb

## 2020-09-16 DIAGNOSIS — R197 Diarrhea, unspecified: Secondary | ICD-10-CM | POA: Diagnosis not present

## 2020-09-16 DIAGNOSIS — R739 Hyperglycemia, unspecified: Secondary | ICD-10-CM | POA: Insufficient documentation

## 2020-09-16 DIAGNOSIS — Z7982 Long term (current) use of aspirin: Secondary | ICD-10-CM | POA: Diagnosis not present

## 2020-09-16 DIAGNOSIS — M8580 Other specified disorders of bone density and structure, unspecified site: Secondary | ICD-10-CM | POA: Diagnosis not present

## 2020-09-16 DIAGNOSIS — R928 Other abnormal and inconclusive findings on diagnostic imaging of breast: Secondary | ICD-10-CM

## 2020-09-16 DIAGNOSIS — N186 End stage renal disease: Secondary | ICD-10-CM | POA: Diagnosis not present

## 2020-09-16 DIAGNOSIS — I129 Hypertensive chronic kidney disease with stage 1 through stage 4 chronic kidney disease, or unspecified chronic kidney disease: Secondary | ICD-10-CM | POA: Diagnosis not present

## 2020-09-16 DIAGNOSIS — Z79899 Other long term (current) drug therapy: Secondary | ICD-10-CM | POA: Diagnosis not present

## 2020-09-16 DIAGNOSIS — Z79811 Long term (current) use of aromatase inhibitors: Secondary | ICD-10-CM | POA: Diagnosis not present

## 2020-09-16 DIAGNOSIS — F419 Anxiety disorder, unspecified: Secondary | ICD-10-CM | POA: Insufficient documentation

## 2020-09-16 DIAGNOSIS — Z171 Estrogen receptor negative status [ER-]: Secondary | ICD-10-CM | POA: Diagnosis not present

## 2020-09-16 DIAGNOSIS — Z170 Estrogen receptor positive status [ER+]: Secondary | ICD-10-CM

## 2020-09-16 DIAGNOSIS — G629 Polyneuropathy, unspecified: Secondary | ICD-10-CM | POA: Insufficient documentation

## 2020-09-16 DIAGNOSIS — Z992 Dependence on renal dialysis: Secondary | ICD-10-CM | POA: Diagnosis not present

## 2020-09-16 DIAGNOSIS — Z9221 Personal history of antineoplastic chemotherapy: Secondary | ICD-10-CM | POA: Insufficient documentation

## 2020-09-16 DIAGNOSIS — Z9889 Other specified postprocedural states: Secondary | ICD-10-CM

## 2020-09-16 DIAGNOSIS — C50211 Malignant neoplasm of upper-inner quadrant of right female breast: Secondary | ICD-10-CM | POA: Diagnosis not present

## 2020-09-16 NOTE — Patient Instructions (Addendum)
[place] at [facility] Discharge Instructions  You were seen today by Dr. Delton Coombes. He went over your recent results and scans. You will be scheduled for an ultrasound of your right chest wall to check out the mass, and a possible biopsy. You will be referred to Dr. Arnoldo Morale for follow-up of your right chest wall mass. Dr. Delton Coombes will see you back in 2 weeks for follow up.   Thank you for choosing [place] at [facility] to provide your oncology and hematology care.  To afford each patient quality time with our provider, please arrive at least 15 minutes before your scheduled appointment time.   If you have a lab appointment with the [place] please come in thru the Main Entrance and check in at the main information desk  You need to re-schedule your appointment should you arrive 10 or more minutes late.  We strive to give you quality time with our providers, and arriving late affects you and other patients whose appointments are after yours.  Also, if you no show three or more times for appointments you may be dismissed from the clinic at the providers discretion.     Again, thank you for choosing [facility].  Our hope is that these requests will decrease the amount of time that you wait before being seen by our physicians.       _____________________________________________________________  Should you have questions after your visit to [facility], please contact our office at [phone] between the hours of 8:00 a.m. and 4:30 p.m.  Voicemails left after 4:00 p.m. will not be returned until the following business day.  For prescription refill requests, have your pharmacy contact our office and allow 72 hours.    Cancer Center Support Programs:   > Cancer Support Group  2nd Tuesday of the month 1pm-2pm, Journey Room

## 2020-09-16 NOTE — Progress Notes (Signed)
[facility] [address], [place] [address]   Patient Care Team: [name], MD as PCP - General (Family Medicine) [name], [name], MD as PCP - Cardiology (Cardiology) [name] [name], MD (Gastroenterology) [name], MD as Consulting Physician (Cardiology) [name], RN as Oncology Nurse Navigator (Oncology)  SUMMARY OF ONCOLOGIC HISTORY: Oncology History  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[place])  09/18/2018 Initial Diagnosis   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[place])   09/26/2018 Cancer Staging   Staging form: Breast, AJCC 8th Edition - Clinical stage from 09/26/2018: Stage IB (cT1c, cN0, cM0, G3, ER+, PR-, HER2-) - Signed by Derek Jack, MD on 09/26/2018   10/30/2018 -  Chemotherapy   The patient had DOXOrubicin (ADRIAMYCIN) chemo injection 108 mg, 60 mg/m2 = 108 mg, Intravenous,  Once, 4 of 4 cycles Administration: 108 mg (10/30/2018), 108 mg (11/20/2018), 108 mg (12/11/2018), 108 mg (01/01/2019) palonosetron (ALOXI) injection 0.25 mg, 0.25 mg, Intravenous,  Once, 4 of 4 cycles Administration: 0.25 mg (10/30/2018), 0.25 mg (11/20/2018), 0.25 mg (12/11/2018), 0.25 mg (01/01/2019) pegfilgrastim (NEULASTA ONPRO KIT) injection 6 mg, 6 mg, Subcutaneous, Once, 4 of 4 cycles Administration: 6 mg (10/30/2018), 6 mg (11/20/2018), 6 mg (12/11/2018), 6 mg (01/01/2019) cyclophosphamide (CYTOXAN) 820 mg in sodium chloride 0.9 % 250 mL chemo infusion, 450 mg/m2 = 820 mg (75 % of original dose 600 mg/m2), Intravenous,  Once, 4 of 4 cycles Dose modification: 450 mg/m2 (75 % of original dose 600 mg/m2, Cycle 1, Reason: Other (see comments), Comment: hemodialysis) Administration: 820 mg (10/30/2018), 820 mg (11/20/2018), 820 mg (12/11/2018), 820 mg (01/01/2019) fosaprepitant (EMEND) 150 mg, dexamethasone (DECADRON) 12 mg in sodium chloride 0.9 % 145 mL IVPB, , Intravenous,  Once, 4 of 4  cycles Administration:  (10/30/2018),  (11/20/2018),  (12/11/2018),  (01/01/2019)  for chemotherapy treatment.      CHIEF COMPLIANT: Follow-up for right breast cancer   INTERVAL HISTORY: Megan [name] is a 60 y.o. female here today for follow up of her right breast cancer. Her last visit was on 09/09/2020.   Today she is accompanied by her husband and she reports feeling okay. She had HD yesterday (M/W/F). She reports having occasional pain over her mastectomy sites. She reports having numbness in her feet which affects her gait, though she denies having any falls.   REVIEW OF SYSTEMS:   Review of Systems  Constitutional: Positive for appetite change (50%) and fatigue (75%).  Gastrointestinal: Positive for diarrhea (loose stools).  Musculoskeletal: Positive for gait problem (d/t numbness in feet).  Neurological: Positive for gait problem (d/t numbness in feet) and numbness (feet).  All other systems reviewed and are negative.   I have reviewed the past medical history, past surgical history, social history and family history with the patient and they are unchanged from previous note.   ALLERGIES:   has No Known Allergies.   MEDICATIONS:  Current Outpatient Medications  Medication Sig Dispense Refill  . amLODipine (NORVASC) 10 MG tablet TAKE 2 TABLETS(20 MG) BY MOUTH AT BEDTIME 60 tablet 3  . anastrozole (ARIMIDEX) 1 MG tablet TAKE 1 TABLET(1 MG) BY MOUTH DAILY (Patient taking differently: Take 1 mg by mouth daily. ) 30 tablet 4  . aspirin EC 81 MG tablet Take 81 mg by mouth daily.    Marland Kitchen atorvastatin (LIPITOR) 20 MG tablet Take 1 tablet (20 mg total) by mouth daily. (Patient taking differently: Take 20 mg  by mouth at bedtime. ) 90 tablet 3  . B Complex-C-Zn-Folic Acid (DIALYVITE 325-QDIY 15) 0.8 MG TABS Take 1 tablet by mouth daily.    . cinacalcet (SENSIPAR) 30 MG tablet Take 2 tablets (60 mg total) by mouth daily. (Patient taking differently: Take 30 mg by mouth daily with  supper. ) 90 tablet 0  . Darbepoetin Alfa (ARANESP, ALBUMIN FREE, IJ) Darbepoetin Alfa (Aranesp)    . DULoxetine (CYMBALTA) 30 MG capsule TAKE 1 CAPSULE(30 MG) BY MOUTH DAILY 90 capsule 1  . gabapentin (NEURONTIN) 300 MG capsule TAKE 1 CAPSULE(300 MG) BY MOUTH AT BEDTIME (Patient taking differently: Take 300 mg by mouth at bedtime. ) 90 capsule 1  . HYDROcodone-acetaminophen (NORCO) 5-325 MG tablet Take 1 tablet by mouth every 6 (six) hours as needed for moderate pain. 20 tablet 0  . lanthanum (FOSRENOL) 1000 MG chewable tablet Chew 2,000-3,000 mg by mouth See admin instructions. Take 3000 mg by mouth with meals and 2000 mg with snacks    . lidocaine-prilocaine (EMLA) cream Apply 1 application topically every Monday, Wednesday, and Friday with hemodialysis.     Marland Kitchen nebivolol (BYSTOLIC) 10 MG tablet Take 1 tablet (10 mg total) by mouth in the morning and at bedtime. 180 tablet 3  . ondansetron (ZOFRAN) 4 MG tablet Take 1 tablet (4 mg total) by mouth every 8 (eight) hours as needed for nausea or vomiting. 20 tablet 1  . pantoprazole (PROTONIX) 40 MG tablet TAKE 1 TABLET(40 MG) BY MOUTH DAILY (Patient taking differently: Take 40 mg by mouth daily. ) 90 tablet 3   No current facility-administered medications for this visit.     PHYSICAL EXAMINATION: Performance status (ECOG): 1 - Symptomatic but completely ambulatory  Vitals:   09/16/20 1410  BP: (!) 144/75  Pulse: 81  Resp: 16  Temp: 98 F (36.7 C)  SpO2: 98%   Wt Readings from Last 3 Encounters:  09/16/20 157 lb 14.4 oz (71.6 kg)  09/09/20 158 lb 9.6 oz (71.9 kg)  07/20/20 164 lb (74.4 kg)   Physical Exam Vitals reviewed.  Constitutional:      Appearance: Normal appearance.  Cardiovascular:     Rate and Rhythm: Normal rate and regular rhythm.     Pulses: Normal pulses.     Heart sounds: Normal heart sounds.  Pulmonary:     Effort: Pulmonary effort is normal.     Breath sounds: Normal breath sounds.  Chest:  Breasts:      Right: Mass (5 mm mass above mastectomy scar) and tenderness present.     Left: No mass.    Musculoskeletal:     Right lower leg: No edema.     Left lower leg: No edema.  Neurological:     General: No focal deficit present.     Mental Status: She is alert and oriented to person, place, and time.  Psychiatric:        Mood and Affect: Mood normal.        Behavior: Behavior normal.     Breast Exam Chaperone: Megan Antis, MD     LABORATORY DATA:  I have reviewed the data as listed CMP Latest Ref Rng & Units 09/07/2020 07/13/2020 07/03/2020  Glucose 70 - 99 mg/dL 112(H) 107(H) 122(H)  BUN 6 - 20 mg/dL 42(H) 28(H) 23(H)  Creatinine 0.44 - 1.00 mg/dL 7.64(H) 8.20(H) 6.10(H)  Sodium 135 - 145 mmol/L 139 139 138  Potassium 3.5 - 5.1 mmol/L 3.1(L) 3.5 3.4(L)  Chloride 98 - 111 mmol/L 101  101 106  CO2 22 - 32 mmol/L 27 - 22  Calcium 8.9 - 10.3 mg/dL 10.5(H) - 10.0  Total Protein 6.5 - 8.1 g/dL 7.5 - 8.1  Total Bilirubin 0.3 - 1.2 mg/dL 0.6 - 0.6  Alkaline Phos 38 - 126 U/L 83 - 103  AST 15 - 41 U/L 17 - 20  ALT 0 - 44 U/L 20 - 18   No results found for: TKZ601 Lab Results  Component Value Date   WBC 7.9 09/07/2020   HGB 11.7 (L) 09/07/2020   HCT 38.9 09/07/2020   MCV 94.4 09/07/2020   PLT 308 09/07/2020   NEUTROABS 4.9 09/07/2020    ASSESSMENT:  1. Stage Ib (T1CN0) right breast cancer, ER positive, PR and HER-2 negative: -Right lumpectomy and sentinel lymph node biopsy on 2/5/2020shows 1.5 cm IDC, grade 3, associated with high-grade DCIS, negative margins, 0/3 lymph nodes positive, ER 50%, PR negative and HER-2 negative, Ki-67 60%. -Oncotype DX recurrence score 47. Distant recurrence at 9 years with tamoxifen alone is 36%. Absolute chemotherapy benefit is more than 15%. -Adjuvant chemotherapy with 4 cycles of AC from 10/30/2018 through 01/01/2019. -She was started on anastrozole in June 2020. -Mammogram on 09/02/2019 showed calcifications in the posterior aspect of  upper outer quadrant of the right breast. -Right mastectomy on 02/18/2020 shows high-grade DCIS, 1.5 cm, no invasive carcinoma, resection margins negative. 0/11 lymph nodes. ER 30% positive, PR negative. -Left simple mastectomy on 06/15/2020 was benign.  Right breast biopsy shows microscopic focus of invasive ductal carcinoma in the background of extensive high-grade DCIS. -Right breast lumpectomy on 07/13/2020 shows multifocal invasive ductal carcinoma, grade 3, largest measuring 1.2 cm.  Extensive high-grade DCIS with necrosis.  Margins are free.  PT1CNX.  There are multiple foci of invasive carcinoma arising from extensive DCIS.  Several small foci of invasive tumor arising from DCIS indicating new primaries.  ER/PR/HER-2 is negative.  Ki-67 is 20%. -PET scan on 09/14/2020 shows areas of nodularity with discrete nodule in the fat of the inferior chest wall within the subcutaneous tissues.  2. Osteopenia: -Bone density on 01/20/2019 shows T score -1.9.   PLAN:  1.  Recurrent right breast cancer, triple receptor negative: -I have reviewed PET CT scan images with the patient and her husband. -Nodular area of increased metabolic activity in the right chest wall with SUV 6.27, measuring 11 mm. -There is a palpable nodule immediately above the horizontal scar of right mastectomy, more towards medially. -I have recommended ultrasound and possible biopsy. -I have also recommended follow-up with Dr. Arnoldo Morale. -Receptor status shows triple negative  with Ki-67 of 20% on the specimen from 07/13/2020. -If there is any residual disease, she will require resection.  If not resectable will consider radiation therapy. -Adjuvant chemotherapy is also considered because of triple negative nature.  She will come back in 2 weeks for follow-up.  2. Osteopenia: -Plan to repeat DEXA scan in 2 years from the last.  3. ESRD on HD: -Continue hemodialysis under the direction of Dr. Marval Regal.  4.  Neuropathy: -Neuropathy of unknown etiology with burning sensation in the bottom of the feet is stable.  5.  Mild hypercalcemia: -Last labs show mildly elevated calcium 10.5.  She is not on calcium and vitamin D supplements.  We will closely monitor.   Orders Placed This Encounter  Procedures  . US Breast Limited Uni Right Inc Axilla    Axilla not needed    Standing Status:   Future    Standing  Expiration Date:   09/16/2021    Order Specific Question:   Reason for Exam (SYMPTOM  OR DIAGNOSIS REQUIRED)    Answer:   breast cancer, positive pet    Order Specific Question:   Preferred imaging location?    Answer:   [facility]    Order Specific Question:   Release to patient    Answer:   Immediate   The patient has a good understanding of the overall plan. she agrees with it. she will call with any problems that may develop before the next visit here.    Derek Jack, MD [place] [phone]   I, Megan [name], am acting as a scribe for Dr. Sanda Linger.  I, Derek Jack MD, have reviewed the above documentation for accuracy and completeness, and I agree with the above.

## 2020-09-17 DIAGNOSIS — N2581 Secondary hyperparathyroidism of renal origin: Secondary | ICD-10-CM | POA: Diagnosis not present

## 2020-09-17 DIAGNOSIS — N186 End stage renal disease: Secondary | ICD-10-CM | POA: Diagnosis not present

## 2020-09-17 DIAGNOSIS — E119 Type 2 diabetes mellitus without complications: Secondary | ICD-10-CM | POA: Diagnosis not present

## 2020-09-17 DIAGNOSIS — Z992 Dependence on renal dialysis: Secondary | ICD-10-CM | POA: Diagnosis not present

## 2020-09-17 DIAGNOSIS — E1129 Type 2 diabetes mellitus with other diabetic kidney complication: Secondary | ICD-10-CM | POA: Diagnosis not present

## 2020-09-17 DIAGNOSIS — D509 Iron deficiency anemia, unspecified: Secondary | ICD-10-CM | POA: Diagnosis not present

## 2020-09-20 DIAGNOSIS — Z992 Dependence on renal dialysis: Secondary | ICD-10-CM | POA: Diagnosis not present

## 2020-09-20 DIAGNOSIS — N186 End stage renal disease: Secondary | ICD-10-CM | POA: Diagnosis not present

## 2020-09-20 DIAGNOSIS — E119 Type 2 diabetes mellitus without complications: Secondary | ICD-10-CM | POA: Diagnosis not present

## 2020-09-20 DIAGNOSIS — D509 Iron deficiency anemia, unspecified: Secondary | ICD-10-CM | POA: Diagnosis not present

## 2020-09-20 DIAGNOSIS — N2581 Secondary hyperparathyroidism of renal origin: Secondary | ICD-10-CM | POA: Diagnosis not present

## 2020-09-20 DIAGNOSIS — E1129 Type 2 diabetes mellitus with other diabetic kidney complication: Secondary | ICD-10-CM | POA: Diagnosis not present

## 2020-09-22 DIAGNOSIS — Z992 Dependence on renal dialysis: Secondary | ICD-10-CM | POA: Diagnosis not present

## 2020-09-22 DIAGNOSIS — N2581 Secondary hyperparathyroidism of renal origin: Secondary | ICD-10-CM | POA: Diagnosis not present

## 2020-09-22 DIAGNOSIS — E119 Type 2 diabetes mellitus without complications: Secondary | ICD-10-CM | POA: Diagnosis not present

## 2020-09-22 DIAGNOSIS — E1129 Type 2 diabetes mellitus with other diabetic kidney complication: Secondary | ICD-10-CM | POA: Diagnosis not present

## 2020-09-22 DIAGNOSIS — N186 End stage renal disease: Secondary | ICD-10-CM | POA: Diagnosis not present

## 2020-09-22 DIAGNOSIS — D509 Iron deficiency anemia, unspecified: Secondary | ICD-10-CM | POA: Diagnosis not present

## 2020-09-24 DIAGNOSIS — Z992 Dependence on renal dialysis: Secondary | ICD-10-CM | POA: Diagnosis not present

## 2020-09-24 DIAGNOSIS — E1129 Type 2 diabetes mellitus with other diabetic kidney complication: Secondary | ICD-10-CM | POA: Diagnosis not present

## 2020-09-24 DIAGNOSIS — N2581 Secondary hyperparathyroidism of renal origin: Secondary | ICD-10-CM | POA: Diagnosis not present

## 2020-09-24 DIAGNOSIS — N186 End stage renal disease: Secondary | ICD-10-CM | POA: Diagnosis not present

## 2020-09-24 DIAGNOSIS — D509 Iron deficiency anemia, unspecified: Secondary | ICD-10-CM | POA: Diagnosis not present

## 2020-09-24 DIAGNOSIS — E119 Type 2 diabetes mellitus without complications: Secondary | ICD-10-CM | POA: Diagnosis not present

## 2020-09-27 DIAGNOSIS — E119 Type 2 diabetes mellitus without complications: Secondary | ICD-10-CM | POA: Diagnosis not present

## 2020-09-27 DIAGNOSIS — E1129 Type 2 diabetes mellitus with other diabetic kidney complication: Secondary | ICD-10-CM | POA: Diagnosis not present

## 2020-09-27 DIAGNOSIS — N2581 Secondary hyperparathyroidism of renal origin: Secondary | ICD-10-CM | POA: Diagnosis not present

## 2020-09-27 DIAGNOSIS — D509 Iron deficiency anemia, unspecified: Secondary | ICD-10-CM | POA: Diagnosis not present

## 2020-09-27 DIAGNOSIS — Z992 Dependence on renal dialysis: Secondary | ICD-10-CM | POA: Diagnosis not present

## 2020-09-27 DIAGNOSIS — N186 End stage renal disease: Secondary | ICD-10-CM | POA: Diagnosis not present

## 2020-09-28 ENCOUNTER — Other Ambulatory Visit: Payer: Self-pay

## 2020-09-28 ENCOUNTER — Other Ambulatory Visit (HOSPITAL_COMMUNITY): Payer: Medicare Other

## 2020-09-28 ENCOUNTER — Ambulatory Visit (HOSPITAL_COMMUNITY)
Admission: RE | Admit: 2020-09-28 | Discharge: 2020-09-28 | Disposition: A | Discharge status: Home / Self Care | Payer: Medicare Other | Source: Ambulatory Visit | Attending: Hematology | Admitting: Hematology

## 2020-09-28 ENCOUNTER — Encounter (HOSPITAL_COMMUNITY): Payer: Self-pay

## 2020-09-28 DIAGNOSIS — C50211 Malignant neoplasm of upper-inner quadrant of right female breast: Secondary | ICD-10-CM

## 2020-09-28 DIAGNOSIS — Z9889 Other specified postprocedural states: Secondary | ICD-10-CM | POA: Insufficient documentation

## 2020-09-28 DIAGNOSIS — Z170 Estrogen receptor positive status [ER+]: Secondary | ICD-10-CM

## 2020-09-28 DIAGNOSIS — N6312 Unspecified lump in the right breast, upper inner quadrant: Secondary | ICD-10-CM | POA: Diagnosis not present

## 2020-09-29 DIAGNOSIS — E119 Type 2 diabetes mellitus without complications: Secondary | ICD-10-CM | POA: Diagnosis not present

## 2020-09-29 DIAGNOSIS — N2581 Secondary hyperparathyroidism of renal origin: Secondary | ICD-10-CM | POA: Diagnosis not present

## 2020-09-29 DIAGNOSIS — Z992 Dependence on renal dialysis: Secondary | ICD-10-CM | POA: Diagnosis not present

## 2020-09-29 DIAGNOSIS — D509 Iron deficiency anemia, unspecified: Secondary | ICD-10-CM | POA: Diagnosis not present

## 2020-09-29 DIAGNOSIS — N186 End stage renal disease: Secondary | ICD-10-CM | POA: Diagnosis not present

## 2020-09-29 DIAGNOSIS — E1129 Type 2 diabetes mellitus with other diabetic kidney complication: Secondary | ICD-10-CM | POA: Diagnosis not present

## 2020-09-30 ENCOUNTER — Encounter: Payer: Self-pay | Admitting: General Surgery

## 2020-09-30 ENCOUNTER — Other Ambulatory Visit: Payer: Self-pay

## 2020-09-30 ENCOUNTER — Inpatient Hospital Stay (HOSPITAL_BASED_OUTPATIENT_CLINIC_OR_DEPARTMENT_OTHER): Payer: Medicare Other | Admitting: Hematology

## 2020-09-30 ENCOUNTER — Ambulatory Visit (INDEPENDENT_AMBULATORY_CARE_PROVIDER_SITE_OTHER): Payer: Medicare Other | Admitting: General Surgery

## 2020-09-30 VITALS — BP 122/70 | HR 72 | Temp 98.2°F | Resp 17 | Wt 157.0 lb

## 2020-09-30 VITALS — BP 126/74 | HR 70 | Temp 97.6°F | Resp 12 | Ht 62.0 in | Wt 156.0 lb

## 2020-09-30 DIAGNOSIS — F419 Anxiety disorder, unspecified: Secondary | ICD-10-CM | POA: Diagnosis not present

## 2020-09-30 DIAGNOSIS — C50211 Malignant neoplasm of upper-inner quadrant of right female breast: Secondary | ICD-10-CM

## 2020-09-30 DIAGNOSIS — Z170 Estrogen receptor positive status [ER+]: Secondary | ICD-10-CM

## 2020-09-30 DIAGNOSIS — Z79811 Long term (current) use of aromatase inhibitors: Secondary | ICD-10-CM | POA: Diagnosis not present

## 2020-09-30 DIAGNOSIS — M8580 Other specified disorders of bone density and structure, unspecified site: Secondary | ICD-10-CM | POA: Diagnosis not present

## 2020-09-30 DIAGNOSIS — Z171 Estrogen receptor negative status [ER-]: Secondary | ICD-10-CM | POA: Diagnosis not present

## 2020-09-30 NOTE — Patient Instructions (Signed)
[place] at [facility] Discharge Instructions  You were seen today by Dr. Delton Coombes. He went over your recent results and scans. Keep your appointment with Dr. Arnoldo Morale. Dr. Delton Coombes will see you back in 4 to 5 weeks for labs and follow up.   Thank you for choosing [place] at [facility] to provide your oncology and hematology care.  To afford each patient quality time with our provider, please arrive at least 15 minutes before your scheduled appointment time.   If you have a lab appointment with the [facility] please come in thru the Main Entrance and check in at the main information desk  You need to re-schedule your appointment should you arrive 10 or more minutes late.  We strive to give you quality time with our providers, and arriving late affects you and other patients whose appointments are after yours.  Also, if you no show three or more times for appointments you may be dismissed from the clinic at the providers discretion.     Again, thank you for choosing [facility].  Our hope is that these requests will decrease the amount of time that you wait before being seen by our physicians.       _____________________________________________________________  Should you have questions after your visit to [facility], please contact our office at [phone] between the hours of 8:00 a.m. and 4:30 p.m.  Voicemails left after 4:00 p.m. will not be returned until the following business day.  For prescription refill requests, have your pharmacy contact our office and allow 72 hours.    Cancer Center Support Programs:   > Cancer Support Group  2nd Tuesday of the month 1pm-2pm, Journey Room

## 2020-09-30 NOTE — Progress Notes (Signed)
[facility] [address] [place] [address]   Patient Care Team: Megan Frizzle, MD as PCP - General (Family Medicine) Megan [name], [name], MD as PCP - Cardiology (Cardiology) Megan [name] Megan Estimable, MD (Gastroenterology) Megan Sark, MD as Consulting Physician (Cardiology) [name], RN as Oncology Nurse Navigator (Oncology)  SUMMARY OF ONCOLOGIC HISTORY: Oncology History  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[place])  09/18/2018 Initial Diagnosis   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[place])   09/26/2018 Cancer Staging   Staging form: Breast, AJCC 8th Edition - Clinical stage from 09/26/2018: Stage IB (cT1c, cN0, cM0, G3, ER+, PR-, HER2-) - Signed by Derek Jack, MD on 09/26/2018   10/30/2018 -  Chemotherapy   The patient had DOXOrubicin (ADRIAMYCIN) chemo injection 108 mg, 60 mg/m2 = 108 mg, Intravenous,  Once, 4 of 4 cycles Administration: 108 mg (10/30/2018), 108 mg (11/20/2018), 108 mg (12/11/2018), 108 mg (01/01/2019) palonosetron (ALOXI) injection 0.25 mg, 0.25 mg, Intravenous,  Once, 4 of 4 cycles Administration: 0.25 mg (10/30/2018), 0.25 mg (11/20/2018), 0.25 mg (12/11/2018), 0.25 mg (01/01/2019) pegfilgrastim (NEULASTA ONPRO KIT) injection 6 mg, 6 mg, Subcutaneous, Once, 4 of 4 cycles Administration: 6 mg (10/30/2018), 6 mg (11/20/2018), 6 mg (12/11/2018), 6 mg (01/01/2019) cyclophosphamide (CYTOXAN) 820 mg in sodium chloride 0.9 % 250 mL chemo infusion, 450 mg/m2 = 820 mg (75 % of original dose 600 mg/m2), Intravenous,  Once, 4 of 4 cycles Dose modification: 450 mg/m2 (75 % of original dose 600 mg/m2, Cycle 1, Reason: Other (see comments), Comment: hemodialysis) Administration: 820 mg (10/30/2018), 820 mg (11/20/2018), 820 mg (12/11/2018), 820 mg (01/01/2019) fosaprepitant (EMEND) 150 mg, dexamethasone (DECADRON) 12 mg in sodium chloride 0.9 % 145 mL IVPB, , Intravenous,  Once, 4 of 4  cycles Administration:  (10/30/2018),  (11/20/2018),  (12/11/2018),  (01/01/2019)  for chemotherapy treatment.      CHIEF COMPLIANT: Follow-up for right breast cancer   INTERVAL HISTORY: [name] is a 60 y.o. female here today for follow up of her right breast cancer. Her last visit was on 09/16/2020.   Today she is accompanied by her husband and she reports feeling okay. She is slightly anxious about the remaining cancer and the possibility of additional treatments. She is tolerating HD well.  She will see Dr. Arnoldo Morale today.   REVIEW OF SYSTEMS:   Review of Systems  Constitutional: Positive for appetite change (75%) and fatigue (75%).  Psychiatric/Behavioral: The patient is nervous/anxious.   All other systems reviewed and are negative.   I have reviewed the past medical history, past surgical history, social history and family history with the patient and they are unchanged from previous note.   ALLERGIES:   has No Known Allergies.   MEDICATIONS:  Current Outpatient Medications  Medication Sig Dispense Refill  . amLODipine (NORVASC) 10 MG tablet TAKE 2 TABLETS(20 MG) BY MOUTH AT BEDTIME 60 tablet 3  . anastrozole (ARIMIDEX) 1 MG tablet TAKE 1 TABLET(1 MG) BY MOUTH DAILY (Patient taking differently: Take 1 mg by mouth daily.) 30 tablet 4  . aspirin EC 81 MG tablet Take 81 mg by mouth daily.    Marland Kitchen atorvastatin (LIPITOR) 20 MG tablet Take 1 tablet (20 mg total) by mouth daily. (Patient taking differently: Take 20 mg by mouth at bedtime.) 90 tablet 3  . B Complex-C-Zn-Folic Acid (DIALYVITE 662-HUTM 15) 0.8 MG TABS Take 1 tablet by mouth daily.    . cinacalcet (  SENSIPAR) 30 MG tablet Take 2 tablets (60 mg total) by mouth daily. (Patient taking differently: Take 30 mg by mouth daily with supper.) 90 tablet 0  . Darbepoetin Alfa (ARANESP, ALBUMIN FREE, IJ) Darbepoetin Alfa (Aranesp)    . DULoxetine (CYMBALTA) 30 MG capsule TAKE 1 CAPSULE(30 MG) BY MOUTH DAILY 90 capsule 1   . gabapentin (NEURONTIN) 300 MG capsule TAKE 1 CAPSULE(300 MG) BY MOUTH AT BEDTIME (Patient taking differently: Take 300 mg by mouth at bedtime.) 90 capsule 1  . HYDROcodone-acetaminophen (NORCO) 5-325 MG tablet Take 1 tablet by mouth every 6 (six) hours as needed for moderate pain. 20 tablet 0  . lanthanum (FOSRENOL) 1000 MG chewable tablet Chew 2,000-3,000 mg by mouth See admin instructions. Take 3000 mg by mouth with meals and 2000 mg with snacks    . lidocaine-prilocaine (EMLA) cream Apply 1 application topically every Monday, Wednesday, and Friday with hemodialysis.     Marland Kitchen nebivolol (BYSTOLIC) 10 MG tablet Take 1 tablet (10 mg total) by mouth in the morning and at bedtime. 180 tablet 3  . ondansetron (ZOFRAN) 4 MG tablet Take 1 tablet (4 mg total) by mouth every 8 (eight) hours as needed for nausea or vomiting. 20 tablet 1  . pantoprazole (PROTONIX) 40 MG tablet TAKE 1 TABLET(40 MG) BY MOUTH DAILY (Patient taking differently: Take 40 mg by mouth daily.) 90 tablet 3  . VITAMIN D, ERGOCALCIFEROL, PO Take by mouth.     No current facility-administered medications for this visit.     PHYSICAL EXAMINATION: Performance status (ECOG): 1 - Symptomatic but completely ambulatory  Vitals:   09/30/20 0829  BP: 122/70  Pulse: 72  Resp: 17  Temp: 98.2 F (36.8 C)  SpO2: 100%   Wt Readings from Last 3 Encounters:  09/30/20 157 lb (71.2 kg)  09/16/20 157 lb 14.4 oz (71.6 kg)  09/09/20 158 lb 9.6 oz (71.9 kg)   Physical Exam Vitals reviewed.  Constitutional:      Appearance: Normal appearance.  Chest:  Breasts:     Right: Mass (1 cm nodule above mastectomy scar medially) present. No tenderness.    Neurological:     General: No focal deficit present.     Mental Status: She is alert and oriented to person, place, and time.  Psychiatric:        Mood and Affect: Mood normal.        Behavior: Behavior normal.     Breast Exam Chaperone: Milinda Antis, MD     LABORATORY DATA:  I  have reviewed the data as listed CMP Latest Ref Rng & Units 09/07/2020 07/13/2020 07/03/2020  Glucose 70 - 99 mg/dL 112(H) 107(H) 122(H)  BUN 6 - 20 mg/dL 42(H) 28(H) 23(H)  Creatinine 0.44 - 1.00 mg/dL 7.64(H) 8.20(H) 6.10(H)  Sodium 135 - 145 mmol/L 139 139 138  Potassium 3.5 - 5.1 mmol/L 3.1(L) 3.5 3.4(L)  Chloride 98 - 111 mmol/L 101 101 106  CO2 22 - 32 mmol/L 27 - 22  Calcium 8.9 - 10.3 mg/dL 10.5(H) - 10.0  Total Protein 6.5 - 8.1 g/dL 7.5 - 8.1  Total Bilirubin 0.3 - 1.2 mg/dL 0.6 - 0.6  Alkaline Phos 38 - 126 U/L 83 - 103  AST 15 - 41 U/L 17 - 20  ALT 0 - 44 U/L 20 - 18   No results found for: WHQ759 Lab Results  Component Value Date   WBC 7.9 09/07/2020   HGB 11.7 (L) 09/07/2020   HCT 38.9 09/07/2020  MCV 94.4 09/07/2020   PLT 308 09/07/2020   NEUTROABS 4.9 09/07/2020    ASSESSMENT:  1. Stage Ib (T1CN0) right breast cancer, ER positive, PR and HER-2 negative: -Right lumpectomy and sentinel lymph node biopsy on 2/5/2020shows 1.5 cm IDC, grade 3, associated with high-grade DCIS, negative margins, 0/3 lymph nodes positive, ER 50%, PR negative and HER-2 negative, Ki-67 60%. -Oncotype DX recurrence score 47. Distant recurrence at 9 years with tamoxifen alone is 36%. Absolute chemotherapy benefit is more than 15%. -Adjuvant chemotherapy with 4 cycles of AC from 10/30/2018 through 01/01/2019. -She was started on anastrozole in June 2020. -Mammogram on 09/02/2019 showed calcifications in the posterior aspect of upper outer quadrant of the right breast. -Right mastectomy on 02/18/2020 shows high-grade DCIS, 1.5 cm, no invasive carcinoma, resection margins negative. 0/11 lymph nodes. ER 30% positive, PR negative. -Left simple mastectomy on 06/15/2020 was benign. Right breast biopsy shows microscopic focus of invasive ductal carcinoma in the background of extensive high-grade DCIS. -Right breast lumpectomy on 07/13/2020 shows multifocal invasive ductal carcinoma, grade 3, largest  measuring 1.2 cm. Extensive high-grade DCIS with necrosis. Margins are free. PT1CNX. There are multiple foci of invasive carcinoma arising from extensive DCIS. Several small foci of invasive tumor arising from DCIS indicating new primaries.  ER/PR/HER-2 is negative.  Ki-67 is 20%. -PET scan on 09/14/2020 shows areas of nodularity with discrete nodule in the fat of the inferior chest wall within the subcutaneous tissues. -Ultrasound of the right chest wall shows masslike abnormality in the upper inner aspect of the right anterior chest at 1 o'clock position measuring 4.1 cm in greatest dimension.  1.7 cm hypoechoic mass in the central anterior right chest concerning for malignancy.  Possible borderline enlarged residual right axillary lymph node.  2. Osteopenia: -Bone density on 01/20/2019 shows T score -1.9.   PLAN:  1.Recurrent right breast cancer, triple receptor negative: -I have reviewed ultrasound results with the patient and her husband. -Physical examination shows the 2 palpable areas identified on the ultrasound. -Recommend follow-up with Dr. Arnoldo Morale for resection. -If positive margins will consider radiation therapy. -She will most likely need chemotherapy as her tumor receptors are triple negative. -We will follow-up after surgical resection.  2. Osteopenia: -Plan to repeat DEXA scan in 2 years from the last.  3. ESRD on HD: -Continue hemodialysis under the direction of Dr. Marval Regal.  4. Neuropathy: -Neuropathy of unknown etiology with burning sensation in the bottom of the feet is stable.  5. Mild hypercalcemia: -Last labs show a mildly elevated calcium 10.5.  She is not on calcium and vitamin D supplement.  We will closely monitor.   No orders of the defined types were placed in this encounter.  The patient has a good understanding of the overall plan. she agrees with it. she will call with any problems that may develop before the next visit  here.    Derek Jack, MD [place] [phone]   I, Milinda Antis, am acting as a scribe for Dr. Sanda Linger.  I, Derek Jack MD, have reviewed the above documentation for accuracy and completeness, and I agree with the above.

## 2020-10-01 DIAGNOSIS — D509 Iron deficiency anemia, unspecified: Secondary | ICD-10-CM | POA: Diagnosis not present

## 2020-10-01 DIAGNOSIS — N186 End stage renal disease: Secondary | ICD-10-CM | POA: Diagnosis not present

## 2020-10-01 DIAGNOSIS — N2581 Secondary hyperparathyroidism of renal origin: Secondary | ICD-10-CM | POA: Diagnosis not present

## 2020-10-01 DIAGNOSIS — E1129 Type 2 diabetes mellitus with other diabetic kidney complication: Secondary | ICD-10-CM | POA: Diagnosis not present

## 2020-10-01 DIAGNOSIS — Z992 Dependence on renal dialysis: Secondary | ICD-10-CM | POA: Diagnosis not present

## 2020-10-01 DIAGNOSIS — E119 Type 2 diabetes mellitus without complications: Secondary | ICD-10-CM | POA: Diagnosis not present

## 2020-10-01 NOTE — Progress Notes (Signed)
Megan [name]; [id number]; 08/10/1961   HPI Patient is a 60 year old black female well-known to me with a history of right breast carcinoma who presents back with an abnormal mass along her right mastectomy scar.  This was recently noted on physical examination and confirmed by PET scan.  She has been referred to my care for evaluation and treatment. Past Medical History:  Diagnosis Date  . (HFpEF) heart failure with preserved ejection fraction ([place])    a. 01/2019 Echo: EF 55-60%, mild conc LVH. DD.  Torn MV chordae.  . Anemia   . Atypical chest pain    a. 08/2018 MV: EF 59%, no ischemia; b. 02/2019 Cath: nonobs dzs.  . Blood transfusion without reported diagnosis   . Cataract   . ESRD (end stage renal disease) on dialysis ([place])    a. HD T, T, S  . Essential hypertension, benign   . GERD (gastroesophageal reflux disease)   . Headache   . Hemorrhoids   . Mixed hyperlipidemia   . Morbid obesity ([place])   . Non-obstructive CAD (coronary artery disease)    a. 02/2019 CathL LM nl, LAD 64m, LCX nl, RCA 25p, EF 55-65%.  Marland Kitchen PONV (postoperative nausea and vomiting)   . S/P colonoscopy Jan 2011   Dr. Benson Norway: sessile polyp (benign lymphoid), large hemorrhoids, repeat 5-10 years  . Temporal arteritis ([place])   . Type 2 diabetes mellitus ([place])   . Wears glasses     Past Surgical History:  Procedure Laterality Date  . ABDOMINAL HYSTERECTOMY    . APPENDECTOMY    . ARTERY BIOPSY N/A 05/09/2018   Procedure: RIGHT TEMPORAL ARTERY BIOPSY;  Surgeon: Judeth Horn, MD;  Location: [facility];  Service: General;  Laterality: N/A;  . BREAST BIOPSY Right 06/15/2020   Procedure: RIGHT BREAST BIOPSY;  Surgeon: Aviva Signs, MD;  [facility];  Service: General;  Laterality: Right;  . CATARACT EXTRACTION W/PHACO Left 02/09/2017   Procedure: CATARACT EXTRACTION PHACO AND INTRAOCULAR LENS PLACEMENT LEFT EYE;  Surgeon: Tonny Branch, MD;  [facility]: [facility];  Service: Ophthalmology;  Laterality: Left;  CDE: 4.89  .  CATARACT EXTRACTION W/PHACO Right 06/04/2017   Procedure: CATARACT EXTRACTION PHACO AND INTRAOCULAR LENS PLACEMENT (IOC);  Surgeon: Tonny Branch, MD;  [facility]: [facility];  Service: Ophthalmology;  Laterality: Right;  CDE: 4.12  . CHOLECYSTECTOMY  09/29/2011   Procedure: LAPAROSCOPIC CHOLECYSTECTOMY;  Surgeon: Jamesetta So, MD;  [facility];  Service: General;  Laterality: N/A;  . COLONOSCOPY  Jan 2011   Dr. Benson Norway: sessile polyp (benign lymphoid), large hemorrhoids, repeat 5-10 years  . COLONOSCOPY N/A 06/12/2016   Procedure: COLONOSCOPY;  Surgeon: Daneil Dolin, MD;  [facility];  Service: Endoscopy;  Laterality: N/A;  1230   . ESOPHAGOGASTRODUODENOSCOPY  09/05/2011   [id number] hiatal hernia; remainder of exam normal. No explanation for patient's abdominal pain with today's examination  . ESOPHAGOGASTRODUODENOSCOPY N/A 12/17/2013   Dr. [name]: gastric erythema, erosion, mild chronic inflammation on path   . LAPAROSCOPIC APPENDECTOMY  09/29/2011   Procedure: APPENDECTOMY LAPAROSCOPIC;  Surgeon: Jamesetta So, MD;  [facility];  Service: General;;  incidental appendectomy  . LEFT HEART CATH AND CORONARY ANGIOGRAPHY N/A 02/28/2019   Procedure: LEFT HEART CATH AND CORONARY ANGIOGRAPHY;  Surgeon: Jettie Booze, MD;  Location: [facility];  Service: Cardiovascular;  Laterality: N/A;  . MASTECTOMY MODIFIED RADICAL Right 02/18/2020   Procedure: MASTECTOMY MODIFIED RADICAL;  Surgeon: Aviva Signs, MD;  [facility];  Service: General;  Laterality: Right;  . MASTECTOMY, PARTIAL Right 07/13/2020   Procedure: RIGHT PARTIAL MASTECTOMY;  Surgeon: Aviva Signs, MD;  [facility];  Service: General;  Laterality: Right;  . PARTIAL MASTECTOMY WITH NEEDLE LOCALIZATION AND AXILLARY SENTINEL LYMPH NODE BX Right 09/18/2018   Procedure: RIGHT PARTIAL MASTECTOMY AFTER NEEDLE LOCALIZATION, SENTINEL LYMPH NODE BIOPSY RIGHT AXILLA;  Surgeon: Aviva Signs, MD;  [facility];   Service: General;  Laterality: Right;  . SIMPLE MASTECTOMY WITH AXILLARY SENTINEL NODE BIOPSY Left 06/15/2020   Procedure: LEFT SIMPLE MASTECTOMY;  Surgeon: Aviva Signs, MD;  [facility];  Service: General;  Laterality: Left;    Family History  Problem Relation Age of Onset  . Hypertension Mother   . Coronary artery disease Mother   . Diabetes Mother   . Hypertension Sister   . Coronary artery disease Sister   . Hypertension Brother   . Heart attack Father   . Hypertension Son   . Heart attack Maternal Aunt   . Hypertension Maternal Aunt   . Diabetes Maternal Aunt   . Heart attack Maternal Uncle   . Hypertension Maternal Uncle   . Diabetes Maternal Uncle   . Heart attack Paternal Aunt   . Hypertension Paternal Aunt   . Diabetes Paternal Aunt   . Heart attack Paternal Uncle   . Hypertension Paternal Uncle   . Diabetes Paternal Uncle   . Heart attack Maternal Grandmother   . Heart attack Maternal Grandfather   . Heart attack Paternal Grandmother   . Heart attack Paternal Grandfather   . Colon cancer Neg Hx     Current Outpatient Medications on File Prior to Visit  Medication Sig Dispense Refill  . amLODipine (NORVASC) 10 MG tablet TAKE 2 TABLETS(20 MG) BY MOUTH AT BEDTIME 60 tablet 3  . anastrozole (ARIMIDEX) 1 MG tablet TAKE 1 TABLET(1 MG) BY MOUTH DAILY (Patient taking differently: Take 1 mg by mouth daily.) 30 tablet 4  . aspirin EC 81 MG tablet Take 81 mg by mouth daily.    Marland Kitchen atorvastatin (LIPITOR) 20 MG tablet Take 1 tablet (20 mg total) by mouth daily. (Patient taking differently: Take 20 mg by mouth at bedtime.) 90 tablet 3  . B Complex-C-Zn-Folic Acid (DIALYVITE 384-TXMI 15) 0.8 MG TABS Take 1 tablet by mouth daily.    . cinacalcet (SENSIPAR) 30 MG tablet Take 2 tablets (60 mg total) by mouth daily. (Patient taking differently: Take 30 mg by mouth daily with supper.) 90 tablet 0  . Darbepoetin Alfa (ARANESP, ALBUMIN FREE, IJ) Darbepoetin Alfa (Aranesp)    .  DULoxetine (CYMBALTA) 30 MG capsule TAKE 1 CAPSULE(30 MG) BY MOUTH DAILY 90 capsule 1  . gabapentin (NEURONTIN) 300 MG capsule TAKE 1 CAPSULE(300 MG) BY MOUTH AT BEDTIME (Patient taking differently: Take 300 mg by mouth at bedtime.) 90 capsule 1  . HYDROcodone-acetaminophen (NORCO) 5-325 MG tablet Take 1 tablet by mouth every 6 (six) hours as needed for moderate pain. 20 tablet 0  . lanthanum (FOSRENOL) 1000 MG chewable tablet Chew 2,000-3,000 mg by mouth See admin instructions. Take 3000 mg by mouth with meals and 2000 mg with snacks    . lidocaine-prilocaine (EMLA) cream Apply 1 application topically every Monday, Wednesday, and Friday with hemodialysis.     Marland Kitchen nebivolol (BYSTOLIC) 10 MG tablet Take 1 tablet (10 mg total) by mouth in the morning and at bedtime. 180 tablet 3  . ondansetron (ZOFRAN) 4 MG tablet Take 1 tablet (4 mg total) by  mouth every 8 (eight) hours as needed for nausea or vomiting. 20 tablet 1  . pantoprazole (PROTONIX) 40 MG tablet TAKE 1 TABLET(40 MG) BY MOUTH DAILY (Patient taking differently: Take 40 mg by mouth daily.) 90 tablet 3  . VITAMIN D, ERGOCALCIFEROL, PO Take by mouth.     No current facility-administered medications on file prior to visit.    No Known Allergies  Social History   Substance and Sexual Activity  Alcohol Use No    Social History   Tobacco Use  Smoking Status Never Smoker  Smokeless Tobacco Never Used    Review of Systems  Constitutional: Negative.   HENT: Negative.   Eyes: Negative.   Respiratory: Negative.   Cardiovascular: Negative.   Gastrointestinal: Negative.   Genitourinary: Negative.   Musculoskeletal: Negative.   Skin: Negative.   Neurological: Negative.   Endo/Heme/Allergies: Negative.   Psychiatric/Behavioral: Negative.     Objective   Vitals:   09/30/20 0920  BP: 126/74  Pulse: 70  Resp: 12  Temp: 97.6 F (36.4 C)  SpO2: 96%    Physical Exam Vitals reviewed.  Constitutional:      Appearance: Normal  appearance. She is not ill-appearing.  HENT:     Head: Normocephalic and atraumatic.  Cardiovascular:     Rate and Rhythm: Normal rate and regular rhythm.     Heart sounds: Normal heart sounds. No murmur heard. No friction rub. No gallop.   Pulmonary:     Effort: Pulmonary effort is normal. No respiratory distress.     Breath sounds: Normal breath sounds. No stridor. No wheezing, rhonchi or rales.  Skin:    General: Skin is warm and dry.  Neurological:     Mental Status: She is alert and oriented to person, place, and time.   Breast: Status post bilateral mastectomies.  A hard somewhat fixed to centimeter subcutaneous nodule is present along the medial aspect of the right mastectomy scar, just superior.  Axilla negative for palpable nodes. Dr. Tomie China notes reviewed Assessment  Right breast/skin nodule, history of right breast carcinoma Plan   Patient will be scheduled for a right breast biopsy.  The risks and benefits of the procedure were fully explained to the patient, who gave informed consent.

## 2020-10-04 DIAGNOSIS — N2581 Secondary hyperparathyroidism of renal origin: Secondary | ICD-10-CM | POA: Diagnosis not present

## 2020-10-04 DIAGNOSIS — Z992 Dependence on renal dialysis: Secondary | ICD-10-CM | POA: Diagnosis not present

## 2020-10-04 DIAGNOSIS — D509 Iron deficiency anemia, unspecified: Secondary | ICD-10-CM | POA: Diagnosis not present

## 2020-10-04 DIAGNOSIS — E1129 Type 2 diabetes mellitus with other diabetic kidney complication: Secondary | ICD-10-CM | POA: Diagnosis not present

## 2020-10-04 DIAGNOSIS — N186 End stage renal disease: Secondary | ICD-10-CM | POA: Diagnosis not present

## 2020-10-04 DIAGNOSIS — E119 Type 2 diabetes mellitus without complications: Secondary | ICD-10-CM | POA: Diagnosis not present

## 2020-10-06 DIAGNOSIS — E119 Type 2 diabetes mellitus without complications: Secondary | ICD-10-CM | POA: Diagnosis not present

## 2020-10-06 DIAGNOSIS — Z992 Dependence on renal dialysis: Secondary | ICD-10-CM | POA: Diagnosis not present

## 2020-10-06 DIAGNOSIS — D509 Iron deficiency anemia, unspecified: Secondary | ICD-10-CM | POA: Diagnosis not present

## 2020-10-06 DIAGNOSIS — N186 End stage renal disease: Secondary | ICD-10-CM | POA: Diagnosis not present

## 2020-10-06 DIAGNOSIS — E1129 Type 2 diabetes mellitus with other diabetic kidney complication: Secondary | ICD-10-CM | POA: Diagnosis not present

## 2020-10-06 DIAGNOSIS — N2581 Secondary hyperparathyroidism of renal origin: Secondary | ICD-10-CM | POA: Diagnosis not present

## 2020-10-06 NOTE — H&P (Signed)
Megan [name]; [id number]; 1961/04/20   HPI Patient is a 60 year old black female well-known to me with a history of right breast carcinoma who presents back with an abnormal mass along her right mastectomy scar.  This was recently noted on physical examination and confirmed by PET scan.  She has been referred to my care for evaluation and treatment. Past Medical History:  Diagnosis Date  . (HFpEF) heart failure with preserved ejection fraction ([place])    a. 01/2019 Echo: EF 55-60%, mild conc LVH. DD.  Torn MV chordae.  . Anemia   . Atypical chest pain    a. 08/2018 MV: EF 59%, no ischemia; b. 02/2019 Cath: nonobs dzs.  . Blood transfusion without reported diagnosis   . Cataract   . ESRD (end stage renal disease) on dialysis ([place])    a. HD T, T, S  . Essential hypertension, benign   . GERD (gastroesophageal reflux disease)   . Headache   . Hemorrhoids   . Mixed hyperlipidemia   . Morbid obesity ([place])   . Non-obstructive CAD (coronary artery disease)    a. 02/2019 CathL LM nl, LAD 66m, LCX nl, RCA 25p, EF 55-65%.  Marland Kitchen PONV (postoperative nausea and vomiting)   . S/P colonoscopy Jan 2011   Dr. Benson Norway: sessile polyp (benign lymphoid), large hemorrhoids, repeat 5-10 years  . Temporal arteritis ([place])   . Type 2 diabetes mellitus ([place])   . Wears glasses     Past Surgical History:  Procedure Laterality Date  . ABDOMINAL HYSTERECTOMY    . APPENDECTOMY    . ARTERY BIOPSY N/A 05/09/2018   Procedure: RIGHT TEMPORAL ARTERY BIOPSY;  Surgeon: Judeth Horn, MD;  Location: [facility];  Service: General;  Laterality: N/A;  . BREAST BIOPSY Right 06/15/2020   Procedure: RIGHT BREAST BIOPSY;  Surgeon: Aviva Signs, MD;  [facility];  Service: General;  Laterality: Right;  . CATARACT EXTRACTION W/PHACO Left 02/09/2017   Procedure: CATARACT EXTRACTION PHACO AND INTRAOCULAR LENS PLACEMENT LEFT EYE;  Surgeon: Tonny Branch, MD;  [facility];  Service: Ophthalmology;  Laterality: Left;  CDE: 4.89  .  CATARACT EXTRACTION W/PHACO Right 06/04/2017   Procedure: CATARACT EXTRACTION PHACO AND INTRAOCULAR LENS PLACEMENT (IOC);  Surgeon: Tonny Branch, MD;  [facility];  Service: Ophthalmology;  Laterality: Right;  CDE: 4.12  . CHOLECYSTECTOMY  09/29/2011   Procedure: LAPAROSCOPIC CHOLECYSTECTOMY;  Surgeon: Jamesetta So, MD;  [facility];  Service: General;  Laterality: N/A;  . COLONOSCOPY  Jan 2011   Dr. Benson Norway: sessile polyp (benign lymphoid), large hemorrhoids, repeat 5-10 years  . COLONOSCOPY N/A 06/12/2016   Procedure: COLONOSCOPY;  Surgeon: Daneil Dolin, MD;  [facility];  Service: Endoscopy;  Laterality: N/A;  1230   . ESOPHAGOGASTRODUODENOSCOPY  09/05/2011   [id number] hiatal hernia; remainder of exam normal. No explanation for patient's abdominal pain with today's examination  . ESOPHAGOGASTRODUODENOSCOPY N/A 12/17/2013   Dr. [name]: gastric erythema, erosion, mild chronic inflammation on path   . LAPAROSCOPIC APPENDECTOMY  09/29/2011   Procedure: APPENDECTOMY LAPAROSCOPIC;  Surgeon: Jamesetta So, MD;  [facility];  Service: General;;  incidental appendectomy  . LEFT HEART CATH AND CORONARY ANGIOGRAPHY N/A 02/28/2019   Procedure: LEFT HEART CATH AND CORONARY ANGIOGRAPHY;  Surgeon: Jettie Booze, MD;  Location: [facility];  Service: Cardiovascular;  Laterality: N/A;  . MASTECTOMY MODIFIED RADICAL Right 02/18/2020   Procedure: MASTECTOMY MODIFIED RADICAL;  Surgeon: Aviva Signs, MD;  [facility];  Service: General;  Laterality: Right;  . MASTECTOMY, PARTIAL Right 07/13/2020   Procedure: RIGHT PARTIAL MASTECTOMY;  Surgeon: Aviva Signs, MD;  [facility];  Service: General;  Laterality: Right;  . PARTIAL MASTECTOMY WITH NEEDLE LOCALIZATION AND AXILLARY SENTINEL LYMPH NODE BX Right 09/18/2018   Procedure: RIGHT PARTIAL MASTECTOMY AFTER NEEDLE LOCALIZATION, SENTINEL LYMPH NODE BIOPSY RIGHT AXILLA;  Surgeon: Aviva Signs, MD;  [facility];   Service: General;  Laterality: Right;  . SIMPLE MASTECTOMY WITH AXILLARY SENTINEL NODE BIOPSY Left 06/15/2020   Procedure: LEFT SIMPLE MASTECTOMY;  Surgeon: Aviva Signs, MD;  [facility];  Service: General;  Laterality: Left;    Family History  Problem Relation Age of Onset  . Hypertension Mother   . Coronary artery disease Mother   . Diabetes Mother   . Hypertension Sister   . Coronary artery disease Sister   . Hypertension Brother   . Heart attack Father   . Hypertension Son   . Heart attack Maternal Aunt   . Hypertension Maternal Aunt   . Diabetes Maternal Aunt   . Heart attack Maternal Uncle   . Hypertension Maternal Uncle   . Diabetes Maternal Uncle   . Heart attack Paternal Aunt   . Hypertension Paternal Aunt   . Diabetes Paternal Aunt   . Heart attack Paternal Uncle   . Hypertension Paternal Uncle   . Diabetes Paternal Uncle   . Heart attack Maternal Grandmother   . Heart attack Maternal Grandfather   . Heart attack Paternal Grandmother   . Heart attack Paternal Grandfather   . Colon cancer Neg Hx     Current Outpatient Medications on File Prior to Visit  Medication Sig Dispense Refill  . amLODipine (NORVASC) 10 MG tablet TAKE 2 TABLETS(20 MG) BY MOUTH AT BEDTIME 60 tablet 3  . anastrozole (ARIMIDEX) 1 MG tablet TAKE 1 TABLET(1 MG) BY MOUTH DAILY (Patient taking differently: Take 1 mg by mouth daily.) 30 tablet 4  . aspirin EC 81 MG tablet Take 81 mg by mouth daily.    Marland Kitchen atorvastatin (LIPITOR) 20 MG tablet Take 1 tablet (20 mg total) by mouth daily. (Patient taking differently: Take 20 mg by mouth at bedtime.) 90 tablet 3  . B Complex-C-Zn-Folic Acid (DIALYVITE 979-GXQJ 15) 0.8 MG TABS Take 1 tablet by mouth daily.    . cinacalcet (SENSIPAR) 30 MG tablet Take 2 tablets (60 mg total) by mouth daily. (Patient taking differently: Take 30 mg by mouth daily with supper.) 90 tablet 0  . Darbepoetin Alfa (ARANESP, ALBUMIN FREE, IJ) Darbepoetin Alfa (Aranesp)    .  DULoxetine (CYMBALTA) 30 MG capsule TAKE 1 CAPSULE(30 MG) BY MOUTH DAILY 90 capsule 1  . gabapentin (NEURONTIN) 300 MG capsule TAKE 1 CAPSULE(300 MG) BY MOUTH AT BEDTIME (Patient taking differently: Take 300 mg by mouth at bedtime.) 90 capsule 1  . HYDROcodone-acetaminophen (NORCO) 5-325 MG tablet Take 1 tablet by mouth every 6 (six) hours as needed for moderate pain. 20 tablet 0  . lanthanum (FOSRENOL) 1000 MG chewable tablet Chew 2,000-3,000 mg by mouth See admin instructions. Take 3000 mg by mouth with meals and 2000 mg with snacks    . lidocaine-prilocaine (EMLA) cream Apply 1 application topically every Monday, Wednesday, and Friday with hemodialysis.     Marland Kitchen nebivolol (BYSTOLIC) 10 MG tablet Take 1 tablet (10 mg total) by mouth in the morning and at bedtime. 180 tablet 3  . ondansetron (ZOFRAN) 4 MG tablet Take 1 tablet (4 mg total) by  mouth every 8 (eight) hours as needed for nausea or vomiting. 20 tablet 1  . pantoprazole (PROTONIX) 40 MG tablet TAKE 1 TABLET(40 MG) BY MOUTH DAILY (Patient taking differently: Take 40 mg by mouth daily.) 90 tablet 3  . VITAMIN D, ERGOCALCIFEROL, PO Take by mouth.     No current facility-administered medications on file prior to visit.    No Known Allergies  Social History   Substance and Sexual Activity  Alcohol Use No    Social History   Tobacco Use  Smoking Status Never Smoker  Smokeless Tobacco Never Used    Review of Systems  Constitutional: Negative.   HENT: Negative.   Eyes: Negative.   Respiratory: Negative.   Cardiovascular: Negative.   Gastrointestinal: Negative.   Genitourinary: Negative.   Musculoskeletal: Negative.   Skin: Negative.   Neurological: Negative.   Endo/Heme/Allergies: Negative.   Psychiatric/Behavioral: Negative.     Objective   Vitals:   09/30/20 0920  BP: 126/74  Pulse: 70  Resp: 12  Temp: 97.6 F (36.4 C)  SpO2: 96%    Physical Exam Vitals reviewed.  Constitutional:      Appearance: Normal  appearance. She is not ill-appearing.  HENT:     Head: Normocephalic and atraumatic.  Cardiovascular:     Rate and Rhythm: Normal rate and regular rhythm.     Heart sounds: Normal heart sounds. No murmur heard. No friction rub. No gallop.   Pulmonary:     Effort: Pulmonary effort is normal. No respiratory distress.     Breath sounds: Normal breath sounds. No stridor. No wheezing, rhonchi or rales.  Skin:    General: Skin is warm and dry.  Neurological:     Mental Status: She is alert and oriented to person, place, and time.   Breast: Status post bilateral mastectomies.  A hard somewhat fixed to centimeter subcutaneous nodule is present along the medial aspect of the right mastectomy scar, just superior.  Axilla negative for palpable nodes. Dr. Tomie China notes reviewed Assessment  Right breast/skin nodule, history of right breast carcinoma Plan   Patient will be scheduled for a right breast biopsy.  The risks and benefits of the procedure were fully explained to the patient, who gave informed consent.

## 2020-10-07 ENCOUNTER — Encounter (HOSPITAL_COMMUNITY)
Admission: RE | Admit: 2020-10-07 | Discharge: 2020-10-07 | Disposition: A | Discharge status: Home / Self Care | Payer: Medicare Other | Source: Ambulatory Visit | Attending: General Surgery | Admitting: General Surgery

## 2020-10-08 ENCOUNTER — Other Ambulatory Visit: Payer: Self-pay

## 2020-10-08 ENCOUNTER — Other Ambulatory Visit (HOSPITAL_COMMUNITY)
Admission: RE | Admit: 2020-10-08 | Discharge: 2020-10-08 | Disposition: A | Discharge status: Home / Self Care | Payer: Medicare Other | Source: Ambulatory Visit | Attending: General Surgery | Admitting: General Surgery

## 2020-10-08 DIAGNOSIS — N186 End stage renal disease: Secondary | ICD-10-CM | POA: Diagnosis not present

## 2020-10-08 DIAGNOSIS — D509 Iron deficiency anemia, unspecified: Secondary | ICD-10-CM | POA: Diagnosis not present

## 2020-10-08 DIAGNOSIS — Z01812 Encounter for preprocedural laboratory examination: Secondary | ICD-10-CM | POA: Insufficient documentation

## 2020-10-08 DIAGNOSIS — Z20822 Contact with and (suspected) exposure to covid-19: Secondary | ICD-10-CM | POA: Diagnosis not present

## 2020-10-08 DIAGNOSIS — E119 Type 2 diabetes mellitus without complications: Secondary | ICD-10-CM | POA: Diagnosis not present

## 2020-10-08 DIAGNOSIS — N2581 Secondary hyperparathyroidism of renal origin: Secondary | ICD-10-CM | POA: Diagnosis not present

## 2020-10-08 DIAGNOSIS — Z992 Dependence on renal dialysis: Secondary | ICD-10-CM | POA: Diagnosis not present

## 2020-10-08 DIAGNOSIS — E1129 Type 2 diabetes mellitus with other diabetic kidney complication: Secondary | ICD-10-CM | POA: Diagnosis not present

## 2020-10-08 LAB — SARS CORONAVIRUS 2 (TAT 6-24 HRS): SARS Coronavirus 2: NEGATIVE

## 2020-10-11 DIAGNOSIS — E1129 Type 2 diabetes mellitus with other diabetic kidney complication: Secondary | ICD-10-CM | POA: Diagnosis not present

## 2020-10-11 DIAGNOSIS — N186 End stage renal disease: Secondary | ICD-10-CM | POA: Diagnosis not present

## 2020-10-11 DIAGNOSIS — Z992 Dependence on renal dialysis: Secondary | ICD-10-CM | POA: Diagnosis not present

## 2020-10-11 DIAGNOSIS — D509 Iron deficiency anemia, unspecified: Secondary | ICD-10-CM | POA: Diagnosis not present

## 2020-10-11 DIAGNOSIS — N2581 Secondary hyperparathyroidism of renal origin: Secondary | ICD-10-CM | POA: Diagnosis not present

## 2020-10-11 DIAGNOSIS — E119 Type 2 diabetes mellitus without complications: Secondary | ICD-10-CM | POA: Diagnosis not present

## 2020-10-12 ENCOUNTER — Encounter (HOSPITAL_COMMUNITY): Payer: Self-pay | Admitting: General Surgery

## 2020-10-12 ENCOUNTER — Ambulatory Visit (HOSPITAL_COMMUNITY)
Admission: RE | Admit: 2020-10-12 | Discharge: 2020-10-12 | Disposition: A | Discharge status: Home / Self Care | Payer: Medicare Other | Attending: General Surgery | Admitting: General Surgery

## 2020-10-12 ENCOUNTER — Encounter (HOSPITAL_COMMUNITY)
Admission: RE | Disposition: A | Discharge status: Home / Self Care | Payer: Self-pay | Source: Home / Self Care | Attending: General Surgery

## 2020-10-12 ENCOUNTER — Ambulatory Visit (HOSPITAL_COMMUNITY): Payer: Medicare Other | Admitting: Anesthesiology

## 2020-10-12 DIAGNOSIS — D631 Anemia in chronic kidney disease: Secondary | ICD-10-CM | POA: Diagnosis not present

## 2020-10-12 DIAGNOSIS — Z79811 Long term (current) use of aromatase inhibitors: Secondary | ICD-10-CM | POA: Diagnosis not present

## 2020-10-12 DIAGNOSIS — Z992 Dependence on renal dialysis: Secondary | ICD-10-CM | POA: Diagnosis not present

## 2020-10-12 DIAGNOSIS — N186 End stage renal disease: Secondary | ICD-10-CM | POA: Diagnosis not present

## 2020-10-12 DIAGNOSIS — Z853 Personal history of malignant neoplasm of breast: Secondary | ICD-10-CM | POA: Diagnosis not present

## 2020-10-12 DIAGNOSIS — C50919 Malignant neoplasm of unspecified site of unspecified female breast: Secondary | ICD-10-CM

## 2020-10-12 DIAGNOSIS — N6310 Unspecified lump in the right breast, unspecified quadrant: Secondary | ICD-10-CM

## 2020-10-12 DIAGNOSIS — I2510 Atherosclerotic heart disease of native coronary artery without angina pectoris: Secondary | ICD-10-CM | POA: Diagnosis not present

## 2020-10-12 DIAGNOSIS — Z9011 Acquired absence of right breast and nipple: Secondary | ICD-10-CM | POA: Diagnosis not present

## 2020-10-12 DIAGNOSIS — Z7982 Long term (current) use of aspirin: Secondary | ICD-10-CM | POA: Diagnosis not present

## 2020-10-12 DIAGNOSIS — Z79899 Other long term (current) drug therapy: Secondary | ICD-10-CM | POA: Insufficient documentation

## 2020-10-12 DIAGNOSIS — N040 Nephrotic syndrome with minor glomerular abnormality: Secondary | ICD-10-CM | POA: Diagnosis not present

## 2020-10-12 DIAGNOSIS — C50911 Malignant neoplasm of unspecified site of right female breast: Secondary | ICD-10-CM | POA: Diagnosis not present

## 2020-10-12 DIAGNOSIS — E1129 Type 2 diabetes mellitus with other diabetic kidney complication: Secondary | ICD-10-CM | POA: Diagnosis not present

## 2020-10-12 DIAGNOSIS — N2581 Secondary hyperparathyroidism of renal origin: Secondary | ICD-10-CM | POA: Diagnosis not present

## 2020-10-12 DIAGNOSIS — D509 Iron deficiency anemia, unspecified: Secondary | ICD-10-CM | POA: Diagnosis not present

## 2020-10-12 HISTORY — DX: Malignant neoplasm of unspecified site of unspecified female breast: C50.919

## 2020-10-12 HISTORY — PX: EXCISION OF BREAST BIOPSY: SHX5822

## 2020-10-12 LAB — CBC WITH DIFFERENTIAL/PLATELET
Basophils Absolute: 0.1 10*3/uL (ref 0.0–0.1)
Basophils Relative: 1 %
Eosinophils Absolute: 0.2 10*3/uL (ref 0.0–0.5)
Eosinophils Relative: 2 %
HCT: 41.4 % (ref 36.0–46.0)
Hemoglobin: 12.9 g/dL (ref 12.0–15.0)
Lymphocytes Relative: 19 %
Lymphs Abs: 1.7 10*3/uL (ref 0.7–4.0)
MCH: 28.4 pg (ref 26.0–34.0)
MCHC: 31.2 g/dL (ref 30.0–36.0)
MCV: 91.2 fL (ref 80.0–100.0)
Monocytes Absolute: 0.8 10*3/uL (ref 0.1–1.0)
Monocytes Relative: 9 %
Neutro Abs: 5.9 10*3/uL (ref 1.7–7.7)
Neutrophils Relative %: 69 %
Platelets: 213 10*3/uL (ref 150–400)
RBC: 4.54 MIL/uL (ref 3.87–5.11)
RDW: 17 % — ABNORMAL HIGH (ref 11.5–15.5)
WBC: 8.6 10*3/uL (ref 4.0–10.5)
nRBC: 0 /100 WBC

## 2020-10-12 LAB — BASIC METABOLIC PANEL
Anion gap: 16 — ABNORMAL HIGH (ref 5–15)
BUN: 59 mg/dL — ABNORMAL HIGH (ref 6–20)
CO2: 27 mmol/L (ref 22–32)
Calcium: 9.4 mg/dL (ref 8.9–10.3)
Chloride: 94 mmol/L — ABNORMAL LOW (ref 98–111)
Creatinine, Ser: 8.05 mg/dL — ABNORMAL HIGH (ref 0.44–1.00)
GFR, Estimated: 5 mL/min — ABNORMAL LOW (ref >60)
Glucose, Bld: 97 mg/dL (ref 70–99)
Potassium: 3.8 mmol/L (ref 3.5–5.1)
Sodium: 137 mmol/L (ref 135–145)

## 2020-10-12 LAB — GLUCOSE, CAPILLARY: Glucose-Capillary: 72 mg/dL (ref 70–99)

## 2020-10-12 SURGERY — EXCISION OF BREAST BIOPSY
Anesthesia: General | Site: Breast | Laterality: Right

## 2020-10-12 MED ORDER — CHLORHEXIDINE GLUCONATE 0.12 % MT SOLN: 15.0000 mL | Freq: Once | OROMUCOSAL | Status: DC

## 2020-10-12 MED ORDER — LACTATED RINGERS IV SOLN: INTRAVENOUS | Status: DC

## 2020-10-12 MED ORDER — CHLORHEXIDINE GLUCONATE CLOTH 2 % EX PADS: 6.0000 | MEDICATED_PAD | Freq: Once | CUTANEOUS | Status: DC

## 2020-10-12 MED ORDER — ORAL CARE MOUTH RINSE: 15.0000 mL | Freq: Once | OROMUCOSAL | Status: DC

## 2020-10-12 MED ORDER — PROPOFOL 10 MG/ML IV BOLUS
INTRAVENOUS | Status: AC
  Filled 2020-10-12: qty 20

## 2020-10-12 MED ORDER — HYDROMORPHONE HCL 1 MG/ML IJ SOLN: 0.2500 mg | INTRAMUSCULAR | Status: DC | PRN

## 2020-10-12 MED ORDER — MIDAZOLAM HCL 2 MG/2ML IJ SOLN
INTRAMUSCULAR | Status: AC
  Filled 2020-10-12: qty 2

## 2020-10-12 MED ORDER — MEPERIDINE HCL 50 MG/ML IJ SOLN
6.2500 mg | INTRAMUSCULAR | Status: DC | PRN
Start: 2020-10-12 — End: 2020-10-12

## 2020-10-12 MED ORDER — FENTANYL CITRATE (PF) 100 MCG/2ML IJ SOLN
INTRAMUSCULAR | Status: AC
  Filled 2020-10-12: qty 2

## 2020-10-12 MED ORDER — BUPIVACAINE HCL (PF) 0.5 % IJ SOLN
INTRAMUSCULAR | Status: AC
  Filled 2020-10-12: qty 30

## 2020-10-12 MED ORDER — MIDAZOLAM HCL 5 MG/5ML IJ SOLN
INTRAMUSCULAR | Status: DC | PRN
  Administered 2020-10-12: 2 mg via INTRAVENOUS

## 2020-10-12 MED ORDER — PROPOFOL 10 MG/ML IV BOLUS
INTRAVENOUS | Status: DC | PRN
  Administered 2020-10-12: 100 mg via INTRAVENOUS

## 2020-10-12 MED ORDER — 0.9 % SODIUM CHLORIDE (POUR BTL) OPTIME
TOPICAL | Status: DC | PRN
Start: 2020-10-12 — End: 2020-10-12
  Administered 2020-10-12: 1000 mL

## 2020-10-12 MED ORDER — PROMETHAZINE HCL 25 MG/ML IJ SOLN: 6.2500 mg | INTRAMUSCULAR | Status: DC | PRN

## 2020-10-12 MED ORDER — HYDROCODONE-ACETAMINOPHEN 5-325 MG PO TABS: 1.0000 | ORAL_TABLET | Freq: Four times a day (QID) | ORAL | 0 refills | Status: DC | PRN

## 2020-10-12 MED ORDER — FENTANYL CITRATE (PF) 100 MCG/2ML IJ SOLN
INTRAMUSCULAR | Status: DC | PRN
  Administered 2020-10-12: 50 ug via INTRAVENOUS

## 2020-10-12 MED ORDER — ONDANSETRON HCL 4 MG/2ML IJ SOLN
INTRAMUSCULAR | Status: DC | PRN
  Administered 2020-10-12: 4 mg via INTRAVENOUS

## 2020-10-12 MED ORDER — LIDOCAINE HCL (CARDIAC) PF 100 MG/5ML IV SOSY
PREFILLED_SYRINGE | INTRAVENOUS | Status: DC | PRN
  Administered 2020-10-12: 50 mg via INTRAVENOUS

## 2020-10-12 MED ORDER — BUPIVACAINE HCL (PF) 0.5 % IJ SOLN
INTRAMUSCULAR | Status: DC | PRN
  Administered 2020-10-12: 5 mL

## 2020-10-12 SURGICAL SUPPLY — 32 items
ADH SKN CLS APL DERMABOND .7 (GAUZE/BANDAGES/DRESSINGS) ×1
APL PRP STRL LF DISP 70% ISPRP (MISCELLANEOUS) ×1
CHLORAPREP W/TINT 26 (MISCELLANEOUS) ×2 IMPLANT
CLOTH BEACON ORANGE TIMEOUT ST (SAFETY) ×2 IMPLANT
COVER LIGHT HANDLE STERIS (MISCELLANEOUS) ×4 IMPLANT
COVER WAND RF STERILE (DRAPES) ×2 IMPLANT
DERMABOND ADVANCED (GAUZE/BANDAGES/DRESSINGS) ×1
DERMABOND ADVANCED .7 DNX12 (GAUZE/BANDAGES/DRESSINGS) IMPLANT
ELECT REM PT RETURN 9FT ADLT (ELECTROSURGICAL) ×2
ELECTRODE REM PT RTRN 9FT ADLT (ELECTROSURGICAL) ×1 IMPLANT
GLOVE SURG SS PI 7.5 STRL IVOR (GLOVE) ×5 IMPLANT
GLOVE SURG UNDER POLY LF SZ7 (GLOVE) ×2 IMPLANT
GOWN STRL REUS W/ TWL XL LVL3 (GOWN DISPOSABLE) IMPLANT
GOWN STRL REUS W/TWL LRG LVL3 (GOWN DISPOSABLE) ×4 IMPLANT
GOWN STRL REUS W/TWL XL LVL3 (GOWN DISPOSABLE) ×2
KIT TURNOVER KIT A (KITS) ×2 IMPLANT
MANIFOLD NEPTUNE II (INSTRUMENTS) ×2 IMPLANT
NDL HYPO 18GX1.5 BLUNT FILL (NEEDLE) ×1 IMPLANT
NDL HYPO 25X1 1.5 SAFETY (NEEDLE) ×1 IMPLANT
NEEDLE HYPO 18GX1.5 BLUNT FILL (NEEDLE) ×2 IMPLANT
NEEDLE HYPO 25X1 1.5 SAFETY (NEEDLE) ×2 IMPLANT
NS IRRIG 1000ML POUR BTL (IV SOLUTION) ×2 IMPLANT
PACK MINOR (CUSTOM PROCEDURE TRAY) ×2 IMPLANT
PAD ARMBOARD 7.5X6 YLW CONV (MISCELLANEOUS) ×2 IMPLANT
SET BASIN LINEN APH (SET/KITS/TRAYS/PACK) ×2 IMPLANT
SPONGE GAUZE 2X2 8PLY STRL LF (GAUZE/BANDAGES/DRESSINGS) IMPLANT
STRIP CLOSURE SKIN 1/4X3 (GAUZE/BANDAGES/DRESSINGS) IMPLANT
SUT MNCRL AB 4-0 PS2 18 (SUTURE) ×2 IMPLANT
SUT SILK 2 0 SH (SUTURE) ×2 IMPLANT
SUT VIC AB 3-0 SH 27 (SUTURE) ×2
SUT VIC AB 3-0 SH 27X BRD (SUTURE) ×1 IMPLANT
SYR CONTROL 10ML LL (SYRINGE) ×2 IMPLANT

## 2020-10-12 NOTE — Interval H&P Note (Signed)
History and Physical Interval Note:  10/12/2020 8:29 AM  Megan [name]  has presented today for surgery, with the diagnosis of right breast cancer.  The various methods of treatment have been discussed with the patient and family. After consideration of risks, benefits and other options for treatment, the patient has consented to  Procedure(s) with comments: EXCISION OF BREAST BIOPSY (Right) - Arnoldo Morale has 2 endo cases in front of this case as a surgical intervention.  The patient's history has been reviewed, patient examined, no change in status, stable for surgery.  I have reviewed the patient's chart and labs.  Questions were answered to the patient's satisfaction.     Aviva Signs

## 2020-10-12 NOTE — Anesthesia Preprocedure Evaluation (Signed)
Anesthesia Evaluation  Patient identified by MRN, date of birth, ID band Patient awake    Reviewed: Allergy & Precautions, NPO status , Patient's Chart, lab work & pertinent test results  History of Anesthesia Complications (+) PONV and history of anesthetic complications  Airway Mallampati: II  TM Distance: >3 FB Neck ROM: Full    Dental  (+) Dental Advisory Given, Teeth Intact   Pulmonary shortness of breath and with exertion, pneumonia, resolved,    Pulmonary exam normal breath sounds clear to auscultation       Cardiovascular Exercise Tolerance: Good hypertension, Pt. on medications + CAD and +CHF  Normal cardiovascular exam Rhythm:Regular Rate:Normal  1. Left ventricular ejection fraction, by estimation, is 60 to 65%. The  left ventricle has normal function. The left ventricle has no regional  wall motion abnormalities. There is mild left ventricular hypertrophy.  Left ventricular diastolic parameters  are consistent with Grade II diastolic dysfunction (pseudonormalization).  Elevated left ventricular end-diastolic pressure.  2. Right ventricular systolic function is normal. The right ventricular  size is normal.  3. Left atrial size was mildly dilated.  4. The mitral valve is abnormal. Mild mitral valve regurgitation. No  evidence of mitral stenosis.  5. The aortic valve is tricuspid. Aortic valve regurgitation is not  visualized. Mild aortic valve sclerosis is present, with no evidence of  aortic valve stenosis.  6. The inferior vena cava is normal in size with <50% respiratory  variability, suggesting right atrial pressure of 8 mmHg.    Neuro/Psych  Headaches, negative psych ROS   GI/Hepatic Neg liver ROS, GERD  Medicated,  Endo/Other  diabetes, Well Controlled, Type 2, Oral Hypoglycemic AgentsHypothyroidism   Renal/GU ESRF and DialysisRenal disease (dialysis - 10/11/20)     Musculoskeletal negative  musculoskeletal ROS (+)   Abdominal   Peds  Hematology  (+) anemia ,   Anesthesia Other Findings Right breast cancer  Reproductive/Obstetrics negative OB ROS                            Anesthesia Physical Anesthesia Plan  ASA: III  Anesthesia Plan: General   Post-op Pain Management:    Induction: Intravenous  PONV Risk Score and Plan:   Airway Management Planned: LMA  Additional Equipment:   Intra-op Plan:   Post-operative Plan: Extubation in OR  Informed Consent: I have reviewed the patients History and Physical, chart, labs and discussed the procedure including the risks, benefits and alternatives for the proposed anesthesia with the patient or authorized representative who has indicated his/her understanding and acceptance.       Plan Discussed with: CRNA and Surgeon  Anesthesia Plan Comments:        Anesthesia Quick Evaluation

## 2020-10-12 NOTE — Op Note (Signed)
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  The risks and benefits of the procedure including bleeding and infection were fully explained to the patient, who gave informed consent.  Procedure note: The patient was placed in the supine position.  After general anesthesia was administered, the right breast area was prepped and draped using usual sterile technique with ChloraPrep.  Surgical site confirmation was performed.  The 1.5 cm nodule was noted just superior to the right mastectomy surgical scar.  An incision was made and the dissection was taken down to the chest wall.  I tried to include all normal appearing breast tissue as able around the mass.  A short suture was placed superiorly and a long suture placed laterally for orientation purposes.  The specimen was then sent to pathology further examination.  Any bleeding was controlled using Bovie electrocautery.  The subcutaneous layer was reapproximated using a 3-0 Vicryl interrupted suture.  The skin was closed using a 4-0 Monocryl subcuticular suture.  0.5% Sensorcaine was instilled into the surrounding wound.  Dermabond was applied.  All tape and needle counts were correct at the end of the procedure.  The patient was awakened and transferred to PACU in stable condition.  Complications: None  EBL: Minimal  Specimen: Right breast biopsy

## 2020-10-12 NOTE — Transfer of Care (Signed)
Immediate Anesthesia Transfer of Care Note  Patient: Megan [name]  Procedure(s) Performed: EXCISION OF RIGHT BREAST BIOPSY (Right Breast)  Patient Location: PACU  Anesthesia Type:General  Level of Consciousness: awake, alert , oriented and patient cooperative  Airway & Oxygen Therapy: Patient Spontanous Breathing and Patient connected to nasal cannula oxygen  Post-op Assessment: Report given to RN, Post -op Vital signs reviewed and stable and Patient moving all extremities X 4  Post vital signs: Reviewed and stable  Last Vitals:  Vitals Value Taken Time  BP 109/72 10/12/20 1000  Temp    Pulse 66 10/12/20 1001  Resp 13 10/12/20 1001  SpO2 97 % 10/12/20 1001  Vitals shown include unvalidated device data.  Last Pain:  Vitals:   10/12/20 0833  TempSrc: Oral  PainSc: 4          Complications: No complications documented.

## 2020-10-12 NOTE — Discharge Instructions (Signed)
Breast Biopsy, Care After This sheet gives you information about how to care for yourself after your procedure. Your health care provider may also give you more specific instructions. If you have problems or questions, contact your health care provider. What can I expect after the procedure? After your procedure, it is common to have:  Bruising on your breast.  Numbness, tingling, or pain near your biopsy site. Follow these instructions at home: Medicines  Take over-the-counter and prescription medicines only as told by your health care provider.  Do not drive for 24 hours if you were given a sedative during your procedure.  Do not drink alcohol while taking pain medicine.  Do not drive or use heavy machinery while taking prescription pain medicine. Biopsy site care  Follow instructions from your health care provider about how to take care of your incision or puncture site. Make sure you: ? Wash your hands with soap and water before you change your bandage (dressing). If soap and water are not available, use hand sanitizer. ? Change your dressing as told by your health care provider. ? Leave any stitches (sutures), skin glue, or adhesive strips in place. These skin closures may need to stay in place for 2 weeks or longer. If adhesive strip edges start to loosen and curl up, you may trim the loose edges. Do not remove adhesive strips completely unless your health care provider tells you to do that.  If you have sutures, keep them dry when bathing.  Check your incision or puncture area every day for signs of infection. Check for: ? Redness, swelling, or pain. ? Fluid or blood. ? Warmth. ? Pus or a bad smell.  Protect the biopsy area. Do not let the area get bumped.      Activity  If you had an incision during your procedure, avoid activities that may pull the incision site open. Avoid stretching, reaching above your head, exercise, sports, or lifting anything that is heavier than  3 lb (1.4 kg).  Return to your normal activities as told by your health care provider. Ask your health care provider what activities are safe for you. Managing pain  If directed, put ice on the biopsy site to relieve tenderness: ? Put ice in a plastic bag. ? Place a towel between your skin and the bag. ? Leave the ice on for 20 minutes, 2-3 times a day. General instructions  Resume your usual diet.  Wear a good support bra for as long as told by your health care provider.  Get checked for extra fluid around your lymph nodes (lymphedema) as often as told by your health care provider.  Keep all follow-up visits as told by your health care provider. This is important. Contact a health care provider if:  You have more redness, swelling, or pain at the biopsy site.  You have more fluid or blood coming from your biopsy site.  Your biopsy site feels warm to the touch.  You have pus or a bad smell coming from the biopsy site.  Your biopsy site breaks open after the sutures or adhesive strips have been removed.  You have a rash.  You have a fever. Get help right away if you have:  Increased bleeding (more than a small spot) from the biopsy site.  Difficulty breathing.  Red streaks around the biopsy site. Summary  After your procedure, it is common to have bruising, numbness, tingling, or pain near the biopsy site.  Do not drive or use heavy  machinery while taking prescription pain medicine.  Wear a good support bra for as long as told by your health care provider.  If you had an incision during your procedure, avoid activities that may pull the incision site open. Ask your health care provider what activities are safe for you. This information is not intended to replace advice given to you by your health care provider. Make sure you discuss any questions you have with your health care provider. Document Revised: 04/12/2020 Document Reviewed: 01/16/2018 Elsevier Patient  Education  2021 [employer] After This sheet gives you information about how to care for yourself after your procedure. Your health care provider may also give you more specific instructions. If you have problems or questions, contact your health care provider. What can I expect after the procedure? After the procedure, it is common to have:  Tiredness.  Forgetfulness about what happened after the procedure.  Impaired judgment for important decisions.  Nausea or vomiting.  Some difficulty with balance. Follow these instructions at home: For the time period you were told by your health care provider:  Rest as needed.  Do not participate in activities where you could fall or become injured.  Do not drive or use machinery.  Do not drink alcohol.  Do not take sleeping pills or medicines that cause drowsiness.  Do not make important decisions or sign legal documents.  Do not take care of children on your own.      Eating and drinking  Follow the diet that is recommended by your health care provider.  Drink enough fluid to keep your urine pale yellow.  If you vomit: ? Drink water, juice, or soup when you can drink without vomiting. ? Make sure you have little or no nausea before eating solid foods. General instructions  Have a responsible adult stay with you for the time you are told. It is important to have someone help care for you until you are awake and alert.  Take over-the-counter and prescription medicines only as told by your health care provider.  If you have sleep apnea, surgery and certain medicines can increase your risk for breathing problems. Follow instructions from your health care provider about wearing your sleep device: ? Anytime you are sleeping, including during daytime naps. ? While taking prescription pain medicines, sleeping medicines, or medicines that make you drowsy.  Avoid smoking.  Keep all follow-up  visits as told by your health care provider. This is important. Contact a health care provider if:  You keep feeling nauseous or you keep vomiting.  You feel light-headed.  You are still sleepy or having trouble with balance after 24 hours.  You develop a rash.  You have a fever.  You have redness or swelling around the IV site. Get help right away if:  You have trouble breathing.  You have new-onset confusion at home. Summary  For several hours after your procedure, you may feel tired. You may also be forgetful and have poor judgment.  Have a responsible adult stay with you for the time you are told. It is important to have someone help care for you until you are awake and alert.  Rest as told. Do not drive or operate machinery. Do not drink alcohol or take sleeping pills.  Get help right away if you have trouble breathing, or if you suddenly become confused. This information is not intended to replace advice given to you by your health care provider. Make  sure you discuss any questions you have with your health care provider. Document Revised: 04/15/2020 Document Reviewed: 07/03/2019 Elsevier Patient Education  2021 [employer].

## 2020-10-12 NOTE — Anesthesia Procedure Notes (Signed)
Procedure Name: LMA Insertion Date/Time: 10/12/2020 9:04 AM Performed by: Jonna Munro, CRNA Pre-anesthesia Checklist: Patient identified, Emergency Drugs available, Suction available, Patient being monitored and Timeout performed Patient Re-evaluated:Patient Re-evaluated prior to induction Oxygen Delivery Method: Circle system utilized Preoxygenation: Pre-oxygenation with 100% oxygen Induction Type: IV induction LMA: LMA flexible inserted LMA Size: 4.0 Number of attempts: 1 Placement Confirmation: positive ETCO2 and breath sounds checked- equal and bilateral Dental Injury: Teeth and Oropharynx as per pre-operative assessment

## 2020-10-12 NOTE — Anesthesia Postprocedure Evaluation (Signed)
Anesthesia Post Note  Patient: Megan [name]  Procedure(s) Performed: EXCISION OF RIGHT BREAST BIOPSY (Right Breast)  Patient location during evaluation: PACU Anesthesia Type: General Level of consciousness: awake and alert and oriented Pain management: pain level controlled Vital Signs Assessment: post-procedure vital signs reviewed and stable Respiratory status: spontaneous breathing and respiratory function stable Cardiovascular status: blood pressure returned to baseline and stable Postop Assessment: no apparent nausea or vomiting Anesthetic complications: no   No complications documented.   Last Vitals:  Vitals:   10/12/20 1107 10/12/20 1125  BP:  124/69  Pulse:  70  Resp:  18  Temp:  36.6 C  SpO2: 96% 100%    Last Pain:  Vitals:   10/12/20 1125  TempSrc: Oral  PainSc: 0-No pain                 Raeanne Deschler C Rohnan Bartleson

## 2020-10-13 ENCOUNTER — Encounter (HOSPITAL_COMMUNITY): Payer: Self-pay | Admitting: General Surgery

## 2020-10-13 DIAGNOSIS — Z992 Dependence on renal dialysis: Secondary | ICD-10-CM | POA: Diagnosis not present

## 2020-10-13 DIAGNOSIS — D509 Iron deficiency anemia, unspecified: Secondary | ICD-10-CM | POA: Diagnosis not present

## 2020-10-13 DIAGNOSIS — E119 Type 2 diabetes mellitus without complications: Secondary | ICD-10-CM | POA: Diagnosis not present

## 2020-10-13 DIAGNOSIS — D631 Anemia in chronic kidney disease: Secondary | ICD-10-CM | POA: Diagnosis not present

## 2020-10-13 DIAGNOSIS — N186 End stage renal disease: Secondary | ICD-10-CM | POA: Diagnosis not present

## 2020-10-13 DIAGNOSIS — E1129 Type 2 diabetes mellitus with other diabetic kidney complication: Secondary | ICD-10-CM | POA: Diagnosis not present

## 2020-10-13 DIAGNOSIS — N2581 Secondary hyperparathyroidism of renal origin: Secondary | ICD-10-CM | POA: Diagnosis not present

## 2020-10-14 ENCOUNTER — Ambulatory Visit: Payer: Medicare Other | Admitting: Family Medicine

## 2020-10-14 LAB — SURGICAL PATHOLOGY

## 2020-10-15 ENCOUNTER — Other Ambulatory Visit (HOSPITAL_COMMUNITY): Payer: Self-pay

## 2020-10-15 ENCOUNTER — Telehealth (INDEPENDENT_AMBULATORY_CARE_PROVIDER_SITE_OTHER): Payer: Medicare Other | Admitting: General Surgery

## 2020-10-15 DIAGNOSIS — D631 Anemia in chronic kidney disease: Secondary | ICD-10-CM | POA: Diagnosis not present

## 2020-10-15 DIAGNOSIS — Z170 Estrogen receptor positive status [ER+]: Secondary | ICD-10-CM

## 2020-10-15 DIAGNOSIS — Z09 Encounter for follow-up examination after completed treatment for conditions other than malignant neoplasm: Secondary | ICD-10-CM

## 2020-10-15 DIAGNOSIS — D509 Iron deficiency anemia, unspecified: Secondary | ICD-10-CM | POA: Diagnosis not present

## 2020-10-15 DIAGNOSIS — C50211 Malignant neoplasm of upper-inner quadrant of right female breast: Secondary | ICD-10-CM

## 2020-10-15 DIAGNOSIS — Z992 Dependence on renal dialysis: Secondary | ICD-10-CM | POA: Diagnosis not present

## 2020-10-15 DIAGNOSIS — N186 End stage renal disease: Secondary | ICD-10-CM | POA: Diagnosis not present

## 2020-10-15 DIAGNOSIS — N2581 Secondary hyperparathyroidism of renal origin: Secondary | ICD-10-CM | POA: Diagnosis not present

## 2020-10-15 DIAGNOSIS — E1129 Type 2 diabetes mellitus with other diabetic kidney complication: Secondary | ICD-10-CM | POA: Diagnosis not present

## 2020-10-15 MED ORDER — ANASTROZOLE 1 MG PO TABS: ORAL_TABLET | ORAL | 4 refills | Status: DC

## 2020-10-15 NOTE — Telephone Encounter (Signed)
Pathology report discussed with patient's husband as patient was in dialysis.  This was recurrent breast cancer but the margins are free.  They have a follow-up appointment with Dr. Delton Coombes of oncology in a week.  He states she is healing well from the procedure.

## 2020-10-18 DIAGNOSIS — N186 End stage renal disease: Secondary | ICD-10-CM | POA: Diagnosis not present

## 2020-10-18 DIAGNOSIS — D631 Anemia in chronic kidney disease: Secondary | ICD-10-CM | POA: Diagnosis not present

## 2020-10-18 DIAGNOSIS — D509 Iron deficiency anemia, unspecified: Secondary | ICD-10-CM | POA: Diagnosis not present

## 2020-10-18 DIAGNOSIS — N2581 Secondary hyperparathyroidism of renal origin: Secondary | ICD-10-CM | POA: Diagnosis not present

## 2020-10-18 DIAGNOSIS — E1129 Type 2 diabetes mellitus with other diabetic kidney complication: Secondary | ICD-10-CM | POA: Diagnosis not present

## 2020-10-18 DIAGNOSIS — Z992 Dependence on renal dialysis: Secondary | ICD-10-CM | POA: Diagnosis not present

## 2020-10-20 DIAGNOSIS — D509 Iron deficiency anemia, unspecified: Secondary | ICD-10-CM | POA: Diagnosis not present

## 2020-10-20 DIAGNOSIS — N2581 Secondary hyperparathyroidism of renal origin: Secondary | ICD-10-CM | POA: Diagnosis not present

## 2020-10-20 DIAGNOSIS — D631 Anemia in chronic kidney disease: Secondary | ICD-10-CM | POA: Diagnosis not present

## 2020-10-20 DIAGNOSIS — N186 End stage renal disease: Secondary | ICD-10-CM | POA: Diagnosis not present

## 2020-10-20 DIAGNOSIS — Z992 Dependence on renal dialysis: Secondary | ICD-10-CM | POA: Diagnosis not present

## 2020-10-20 DIAGNOSIS — E1129 Type 2 diabetes mellitus with other diabetic kidney complication: Secondary | ICD-10-CM | POA: Diagnosis not present

## 2020-10-22 DIAGNOSIS — D631 Anemia in chronic kidney disease: Secondary | ICD-10-CM | POA: Diagnosis not present

## 2020-10-22 DIAGNOSIS — E1129 Type 2 diabetes mellitus with other diabetic kidney complication: Secondary | ICD-10-CM | POA: Diagnosis not present

## 2020-10-22 DIAGNOSIS — N2581 Secondary hyperparathyroidism of renal origin: Secondary | ICD-10-CM | POA: Diagnosis not present

## 2020-10-22 DIAGNOSIS — D509 Iron deficiency anemia, unspecified: Secondary | ICD-10-CM | POA: Diagnosis not present

## 2020-10-22 DIAGNOSIS — Z992 Dependence on renal dialysis: Secondary | ICD-10-CM | POA: Diagnosis not present

## 2020-10-22 DIAGNOSIS — N186 End stage renal disease: Secondary | ICD-10-CM | POA: Diagnosis not present

## 2020-10-25 DIAGNOSIS — N2581 Secondary hyperparathyroidism of renal origin: Secondary | ICD-10-CM | POA: Diagnosis not present

## 2020-10-25 DIAGNOSIS — D509 Iron deficiency anemia, unspecified: Secondary | ICD-10-CM | POA: Diagnosis not present

## 2020-10-25 DIAGNOSIS — E1129 Type 2 diabetes mellitus with other diabetic kidney complication: Secondary | ICD-10-CM | POA: Diagnosis not present

## 2020-10-25 DIAGNOSIS — D631 Anemia in chronic kidney disease: Secondary | ICD-10-CM | POA: Diagnosis not present

## 2020-10-25 DIAGNOSIS — Z992 Dependence on renal dialysis: Secondary | ICD-10-CM | POA: Diagnosis not present

## 2020-10-25 DIAGNOSIS — N186 End stage renal disease: Secondary | ICD-10-CM | POA: Diagnosis not present

## 2020-10-27 ENCOUNTER — Other Ambulatory Visit (HOSPITAL_COMMUNITY): Payer: Self-pay

## 2020-10-27 DIAGNOSIS — E1129 Type 2 diabetes mellitus with other diabetic kidney complication: Secondary | ICD-10-CM | POA: Diagnosis not present

## 2020-10-27 DIAGNOSIS — C50211 Malignant neoplasm of upper-inner quadrant of right female breast: Secondary | ICD-10-CM

## 2020-10-27 DIAGNOSIS — N186 End stage renal disease: Secondary | ICD-10-CM | POA: Diagnosis not present

## 2020-10-27 DIAGNOSIS — Z170 Estrogen receptor positive status [ER+]: Secondary | ICD-10-CM

## 2020-10-27 DIAGNOSIS — N2581 Secondary hyperparathyroidism of renal origin: Secondary | ICD-10-CM | POA: Diagnosis not present

## 2020-10-27 DIAGNOSIS — D509 Iron deficiency anemia, unspecified: Secondary | ICD-10-CM | POA: Diagnosis not present

## 2020-10-27 DIAGNOSIS — Z992 Dependence on renal dialysis: Secondary | ICD-10-CM | POA: Diagnosis not present

## 2020-10-27 DIAGNOSIS — D631 Anemia in chronic kidney disease: Secondary | ICD-10-CM | POA: Diagnosis not present

## 2020-10-28 ENCOUNTER — Other Ambulatory Visit: Payer: Self-pay

## 2020-10-28 ENCOUNTER — Inpatient Hospital Stay (HOSPITAL_BASED_OUTPATIENT_CLINIC_OR_DEPARTMENT_OTHER): Payer: Medicare Other | Admitting: Hematology

## 2020-10-28 ENCOUNTER — Inpatient Hospital Stay (HOSPITAL_COMMUNITY): Payer: Medicare Other | Attending: Hematology

## 2020-10-28 VITALS — BP 138/76 | HR 72 | Temp 98.1°F | Resp 17 | Wt 163.0 lb

## 2020-10-28 DIAGNOSIS — Z992 Dependence on renal dialysis: Secondary | ICD-10-CM | POA: Insufficient documentation

## 2020-10-28 DIAGNOSIS — Z7982 Long term (current) use of aspirin: Secondary | ICD-10-CM | POA: Diagnosis not present

## 2020-10-28 DIAGNOSIS — Z170 Estrogen receptor positive status [ER+]: Secondary | ICD-10-CM

## 2020-10-28 DIAGNOSIS — N186 End stage renal disease: Secondary | ICD-10-CM | POA: Diagnosis not present

## 2020-10-28 DIAGNOSIS — Z79811 Long term (current) use of aromatase inhibitors: Secondary | ICD-10-CM | POA: Insufficient documentation

## 2020-10-28 DIAGNOSIS — M8580 Other specified disorders of bone density and structure, unspecified site: Secondary | ICD-10-CM | POA: Diagnosis not present

## 2020-10-28 DIAGNOSIS — Z9221 Personal history of antineoplastic chemotherapy: Secondary | ICD-10-CM | POA: Insufficient documentation

## 2020-10-28 DIAGNOSIS — Z9011 Acquired absence of right breast and nipple: Secondary | ICD-10-CM | POA: Diagnosis not present

## 2020-10-28 DIAGNOSIS — C50211 Malignant neoplasm of upper-inner quadrant of right female breast: Secondary | ICD-10-CM | POA: Diagnosis not present

## 2020-10-28 DIAGNOSIS — Z79899 Other long term (current) drug therapy: Secondary | ICD-10-CM | POA: Insufficient documentation

## 2020-10-28 DIAGNOSIS — G629 Polyneuropathy, unspecified: Secondary | ICD-10-CM | POA: Diagnosis not present

## 2020-10-28 LAB — COMPREHENSIVE METABOLIC PANEL
ALT: 19 U/L (ref 0–44)
AST: 17 U/L (ref 15–41)
Albumin: 3.8 g/dL (ref 3.5–5.0)
Alkaline Phosphatase: 82 U/L (ref 38–126)
Anion gap: 13 (ref 5–15)
BUN: 44 mg/dL — ABNORMAL HIGH (ref 6–20)
CO2: 26 mmol/L (ref 22–32)
Calcium: 9.8 mg/dL (ref 8.9–10.3)
Chloride: 100 mmol/L (ref 98–111)
Creatinine, Ser: 8.11 mg/dL — ABNORMAL HIGH (ref 0.44–1.00)
GFR, Estimated: 5 mL/min — ABNORMAL LOW (ref >60)
Glucose, Bld: 108 mg/dL — ABNORMAL HIGH (ref 70–99)
Potassium: 4.3 mmol/L (ref 3.5–5.1)
Sodium: 139 mmol/L (ref 135–145)
Total Bilirubin: 0.5 mg/dL (ref 0.3–1.2)
Total Protein: 7.9 g/dL (ref 6.5–8.1)

## 2020-10-28 LAB — CBC WITH DIFFERENTIAL/PLATELET
Abs Immature Granulocytes: 0.03 10*3/uL (ref 0.00–0.07)
Basophils Absolute: 0.1 10*3/uL (ref 0.0–0.1)
Basophils Relative: 1 %
Eosinophils Absolute: 0.2 10*3/uL (ref 0.0–0.5)
Eosinophils Relative: 2 %
HCT: 36.8 % (ref 36.0–46.0)
Hemoglobin: 11.5 g/dL — ABNORMAL LOW (ref 12.0–15.0)
Immature Granulocytes: 0 %
Lymphocytes Relative: 25 %
Lymphs Abs: 2.2 10*3/uL (ref 0.7–4.0)
MCH: 29.1 pg (ref 26.0–34.0)
MCHC: 31.3 g/dL (ref 30.0–36.0)
MCV: 93.2 fL (ref 80.0–100.0)
Monocytes Absolute: 0.8 10*3/uL (ref 0.1–1.0)
Monocytes Relative: 9 %
Neutro Abs: 5.5 10*3/uL (ref 1.7–7.7)
Neutrophils Relative %: 63 %
Platelets: 257 10*3/uL (ref 150–400)
RBC: 3.95 MIL/uL (ref 3.87–5.11)
RDW: 17.9 % — ABNORMAL HIGH (ref 11.5–15.5)
WBC: 8.7 10*3/uL (ref 4.0–10.5)
nRBC: 0 % (ref 0.0–0.2)

## 2020-10-28 NOTE — Progress Notes (Addendum)
[facility] [address] [place] [address]   Patient Care Team: [name], MD as PCP - General (Family Medicine) [name], [name], MD as PCP - Cardiology (Cardiology) [name] [name], MD (Gastroenterology) [name], MD as Consulting Physician (Cardiology) [name], RN as Oncology Nurse Navigator (Oncology)  SUMMARY OF ONCOLOGIC HISTORY: Oncology History  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[place])  09/18/2018 Initial Diagnosis   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[place])   09/26/2018 Cancer Staging   Staging form: Breast, AJCC 8th Edition - Clinical stage from 09/26/2018: Stage IB (cT1c, cN0, cM0, G3, ER+, PR-, HER2-) - Signed by Derek Jack, MD on 09/26/2018   10/30/2018 -  Chemotherapy   The patient had DOXOrubicin (ADRIAMYCIN) chemo injection 108 mg, 60 mg/m2 = 108 mg, Intravenous,  Once, 4 of 4 cycles Administration: 108 mg (10/30/2018), 108 mg (11/20/2018), 108 mg (12/11/2018), 108 mg (01/01/2019) palonosetron (ALOXI) injection 0.25 mg, 0.25 mg, Intravenous,  Once, 4 of 4 cycles Administration: 0.25 mg (10/30/2018), 0.25 mg (11/20/2018), 0.25 mg (12/11/2018), 0.25 mg (01/01/2019) pegfilgrastim (NEULASTA ONPRO KIT) injection 6 mg, 6 mg, Subcutaneous, Once, 4 of 4 cycles Administration: 6 mg (10/30/2018), 6 mg (11/20/2018), 6 mg (12/11/2018), 6 mg (01/01/2019) cyclophosphamide (CYTOXAN) 820 mg in sodium chloride 0.9 % 250 mL chemo infusion, 450 mg/m2 = 820 mg (75 % of original dose 600 mg/m2), Intravenous,  Once, 4 of 4 cycles Dose modification: 450 mg/m2 (75 % of original dose 600 mg/m2, Cycle 1, Reason: Other (see comments), Comment: hemodialysis) Administration: 820 mg (10/30/2018), 820 mg (11/20/2018), 820 mg (12/11/2018), 820 mg (01/01/2019) fosaprepitant (EMEND) 150 mg, dexamethasone (DECADRON) 12 mg in sodium chloride 0.9 % 145 mL IVPB, , Intravenous,  Once, 4 of 4  cycles Administration:  (10/30/2018),  (11/20/2018),  (12/11/2018),  (01/01/2019)  for chemotherapy treatment.      CHIEF COMPLIANT: Follow-up for right breast cancer   INTERVAL HISTORY: Megan [name] is a 60 y.o. female here today for follow up of her right breast cancer. Her last visit was on 09/30/2020.   Today she is accompanied by her husband and she reports feeling well. She had her re-excision done by Dr. Arnoldo Morale on 10/12/2020 and reports that Dr. Arnoldo Morale removed all of the tumor. Her incision continues to hurt, though it is improving, and notes that her left breast continues to hurt mildly. She continues having sharp pains in her feet which are worse at night and hurt 5 days out of the week; she continues taking gabapentin 300 mg QHS and Lyrica. She continues taking Arimidex and is tolerating it well. She continues going to HD on M/W/F. She remembers having about 10 syncopal episodes while undergoing AC previously and she still has hypotension about 3 hours into HD; she had a syncopal episode during HD on 03/14.  She denies having any family history of cancer.   REVIEW OF SYSTEMS:   Review of Systems  Constitutional: Positive for appetite change (75%) and fatigue (50%).  Cardiovascular: Positive for chest pain (mild incisional pain).  Neurological: Positive for numbness (sharp pain worse at night).       Syncopal episodes during HD  All other systems reviewed and are negative.   I have reviewed the past medical history, past surgical history, social history and family history with the patient and they are unchanged from previous note.   ALLERGIES:   has No Known Allergies.  MEDICATIONS:  Current Outpatient Medications  Medication Sig Dispense Refill  . Darbepoetin Alfa (ARANESP, ALBUMIN FREE, IJ) Darbepoetin Alfa (Aranesp)    . nebivolol (BYSTOLIC) 10 MG tablet     . amLODipine (NORVASC) 10 MG tablet TAKE 2 TABLETS(20 MG) BY MOUTH AT BEDTIME (Patient taking  differently: Take 10 mg by mouth in the morning and at bedtime.) 60 tablet 3  . anastrozole (ARIMIDEX) 1 MG tablet TAKE 1 TABLET(1 MG) BY MOUTH DAILY 30 tablet 4  . aspirin EC 81 MG tablet Take 81 mg by mouth daily.    Marland Kitchen atorvastatin (LIPITOR) 20 MG tablet Take 1 tablet (20 mg total) by mouth daily. (Patient taking differently: Take 20 mg by mouth at bedtime.) 90 tablet 3  . B Complex-C-Zn-Folic Acid (DIALYVITE 263-ZCHY 15) 0.8 MG TABS Take 1 tablet by mouth daily.    . cinacalcet (SENSIPAR) 30 MG tablet Take 2 tablets (60 mg total) by mouth daily. (Patient taking differently: Take 30 mg by mouth daily with supper.) 90 tablet 0  . DULoxetine (CYMBALTA) 30 MG capsule TAKE 1 CAPSULE(30 MG) BY MOUTH DAILY (Patient taking differently: Take 30 mg by mouth daily.) 90 capsule 1  . gabapentin (NEURONTIN) 300 MG capsule TAKE 1 CAPSULE(300 MG) BY MOUTH AT BEDTIME (Patient taking differently: Take 300 mg by mouth at bedtime.) 90 capsule 1  . HYDROcodone-acetaminophen (NORCO) 5-325 MG tablet Take 1 tablet by mouth every 6 (six) hours as needed for moderate pain. 20 tablet 0  . lanthanum (FOSRENOL) 1000 MG chewable tablet Chew 2,000-3,000 mg by mouth See admin instructions. Take 3 tablets (3000 mg) by mouth with meals and take 2 tablets (2000 mg) with snacks    . lidocaine-prilocaine (EMLA) cream Apply 1 application topically every Monday, Wednesday, and Friday with hemodialysis.     Marland Kitchen nebivolol (BYSTOLIC) 10 MG tablet Take 1 tablet (10 mg total) by mouth in the morning and at bedtime. 180 tablet 3  . ondansetron (ZOFRAN) 4 MG tablet Take 1 tablet (4 mg total) by mouth every 8 (eight) hours as needed for nausea or vomiting. 20 tablet 1  . pantoprazole (PROTONIX) 40 MG tablet TAKE 1 TABLET(40 MG) BY MOUTH DAILY (Patient taking differently: Take 40 mg by mouth daily.) 90 tablet 3  . tiZANidine (ZANAFLEX) 4 MG tablet Take 4 mg by mouth daily.     No current facility-administered medications for this visit.      PHYSICAL EXAMINATION: Performance status (ECOG): 1 - Symptomatic but completely ambulatory  Vitals:   10/28/20 1113  BP: 138/76  Pulse: 72  Resp: 17  Temp: 98.1 F (36.7 C)  SpO2: 99%   Wt Readings from Last 3 Encounters:  10/28/20 163 lb (73.9 kg)  10/12/20 156 lb 1.4 oz (70.8 kg)  09/30/20 156 lb (70.8 kg)   Physical Exam Vitals reviewed.  Constitutional:      Appearance: Normal appearance.  Cardiovascular:     Rate and Rhythm: Normal rate and regular rhythm.     Pulses: Normal pulses.     Heart sounds: Normal heart sounds.  Pulmonary:     Effort: Pulmonary effort is normal.     Breath sounds: Normal breath sounds.  Chest:  Breasts:     Right: Tenderness (TTP in UIQ) present. No swelling or mass.     Left: Tenderness (TTP in UOQ) present. No swelling or mass.    Abdominal:     Palpations: Abdomen is soft. There is no hepatomegaly or mass.     Tenderness: There  is no abdominal tenderness.  Musculoskeletal:     Right lower leg: No edema.     Left lower leg: No edema.  Neurological:     General: No focal deficit present.     Mental Status: She is alert and oriented to person, place, and time.  Psychiatric:        Mood and Affect: Mood normal.        Behavior: Behavior normal.     Breast Exam Chaperone: Milinda Antis, MD     LABORATORY DATA:  I have reviewed the data as listed CMP Latest Ref Rng & Units 10/28/2020 10/12/2020 09/07/2020  Glucose 70 - 99 mg/dL 108(H) 97 112(H)  BUN 6 - 20 mg/dL 44(H) 59(H) 42(H)  Creatinine 0.44 - 1.00 mg/dL 8.11(H) 8.05(H) 7.64(H)  Sodium 135 - 145 mmol/L 139 137 139  Potassium 3.5 - 5.1 mmol/L 4.3 3.8 3.1(L)  Chloride 98 - 111 mmol/L 100 94(L) 101  CO2 22 - 32 mmol/L 26 27 27   Calcium 8.9 - 10.3 mg/dL 9.8 9.4 10.5(H)  Total Protein 6.5 - 8.1 g/dL 7.9 - 7.5  Total Bilirubin 0.3 - 1.2 mg/dL 0.5 - 0.6  Alkaline Phos 38 - 126 U/L 82 - 83  AST 15 - 41 U/L 17 - 17  ALT 0 - 44 U/L 19 - 20   No results found for:  EHM094 Lab Results  Component Value Date   WBC 8.7 10/28/2020   HGB 11.5 (L) 10/28/2020   HCT 36.8 10/28/2020   MCV 93.2 10/28/2020   PLT 257 10/28/2020   NEUTROABS 5.5 10/28/2020    ASSESSMENT:  1. Stage Ib (T1CN0) right breast cancer, ER positive, PR and HER-2 negative: -Right lumpectomy and sentinel lymph node biopsy on 2/5/2020shows 1.5 cm IDC, grade 3, associated with high-grade DCIS, negative margins, 0/3 lymph nodes positive, ER 50%, PR negative and HER-2 negative, Ki-67 60%. -Oncotype DX recurrence score 47. Distant recurrence at 9 years with tamoxifen alone is 36%. Absolute chemotherapy benefit is more than 15%. -Adjuvant chemotherapy with 4 cycles of AC from 10/30/2018 through 01/01/2019. -She was started on anastrozole in June 2020. -Mammogram on 09/02/2019 showed calcifications in the posterior aspect of upper outer quadrant of the right breast. -Right mastectomy on 02/18/2020 shows high-grade DCIS, 1.5 cm, no invasive carcinoma, resection margins negative. 0/11 lymph nodes. ER 30% positive, PR negative. -Left simple mastectomy on 06/15/2020 was benign. Right breast biopsy shows microscopic focus of invasive ductal carcinoma in the background of extensive high-grade DCIS. -Right breast lumpectomy on 07/13/2020 shows multifocal invasive ductal carcinoma, grade 3, largest measuring 1.2 cm. Extensive high-grade DCIS with necrosis. Margins are free. PT1CNX. There are multiple foci of invasive carcinoma arising from extensive DCIS. Several small foci of invasive tumor arising from DCIS indicating new primaries.ER/PR/HER-2 is negative. Ki-67 is 20%. -PET scan on 09/14/2020 shows areas of nodularity with discrete nodule in the fat of the inferior chest wall within the subcutaneous tissues. -Ultrasound of the right chest wall shows masslike abnormality in the upper inner aspect of the right anterior chest at 1 o'clock position measuring 4.1 cm in greatest dimension.  1.7 cm  hypoechoic mass in the central anterior right chest concerning for malignancy.  Possible borderline enlarged residual right axillary lymph node.  2. Osteopenia: -Bone density on 01/20/2019 shows T score -1.9.   PLAN:  1.Recurrent right breast cancer, triple receptor negative: -She had surgical resection of the recurrent right breast cancer which showed 1.3 cm triple negative breast cancer.  Margins  were negative.  Associated DCIS was also resected. -Recommend adjuvant chemotherapy.  Ideally 4 cycles of TC will be.  However she has peripheral neuropathy which is very bad. -She has received 4 cycles of AC 2 years ago in adjuvant setting.  He has done fairly well except she had some problems with drop in blood pressure particularly at dialysis. -I have called and talked to Dr. Marval Regal who will help [place] with case management of blood pressures. -Also recommend genetic testing.  If she harbors a BRCA1/2, she will be a candidate for olaparib. -We will also refer her to radiation therapy. -RTC 2 weeks for follow-up.  Will consider PICC line placement for chemotherapy. -We will plan to hold her anastrozole when chemotherapy started.  2. Osteopenia: -Plan to repeat DEXA scan in 2 years from the last.  3. ESRD on HD: -Continue hemodialysis under the direction of Dr. Marval Regal on Monday, Wednesday and Friday.  4. Neuropathy: -Neuropathy of unknown etiology with burning sensation in the bottom of the feet is stable. -Discontinue gabapentin.  5. Mild hypercalcemia: -This has resolved latest calcium of 9.8.   Breast Cancer therapy associated bone loss: I have recommended calcium, Vitamin D and weight bearing exercises.   No orders of the defined types were placed in this encounter.  The patient has a good understanding of the overall plan. she agrees with it. she will call with any problems that may develop before the next visit here.    Derek Jack, MD [facility] [phone]   I, Milinda Antis, am acting as a scribe for Dr. Sanda Linger.  I, Derek Jack MD, have reviewed the above documentation for accuracy and completeness, and I agree with the above.

## 2020-10-28 NOTE — Patient Instructions (Signed)
[place] at [facility] Discharge Instructions  You were seen today by Dr. Delton Coombes. He went over your recent results. You will be referred to a geneticist to check for any problematic genes that may be contributing to your breast cancer and to check if you are eligible to receive a chemo tablet called Lynparza. You may also need to undergo 4 cycles of chemo called AC (Adriamycin and Cytoxan) once every 3 weeks followed by radiation to the breast lesion. Side effects of the chemo include hair loss, worsening neuropathy, worsening heart function and fatigue. You will be scheduled to have an echocardiogram to check the status of your heart. You will be referred to radiation oncology to see if you will benefit from additional radiation. Dr. Delton Coombes will see you back in 2 weeks for follow up.   Thank you for choosing [facility] at [facility] to provide your oncology and hematology care.  To afford each patient quality time with our provider, please arrive at least 15 minutes before your scheduled appointment time.   If you have a lab appointment with the [facility] please come in thru the Main Entrance and check in at the main information desk  You need to re-schedule your appointment should you arrive 10 or more minutes late.  We strive to give you quality time with our providers, and arriving late affects you and other patients whose appointments are after yours.  Also, if you no show three or more times for appointments you may be dismissed from the clinic at the providers discretion.     Again, thank you for choosing [facility].  Our hope is that these requests will decrease the amount of time that you wait before being seen by our physicians.       _____________________________________________________________  Should you have questions after your visit to [facility], please contact our office at [phone] between  the hours of 8:00 a.m. and 4:30 p.m.  Voicemails left after 4:00 p.m. will not be returned until the following business day.  For prescription refill requests, have your pharmacy contact our office and allow 72 hours.    Cancer Center Support Programs:   > Cancer Support Group  2nd Tuesday of the month 1pm-2pm, Journey Room

## 2020-10-29 DIAGNOSIS — D509 Iron deficiency anemia, unspecified: Secondary | ICD-10-CM | POA: Diagnosis not present

## 2020-10-29 DIAGNOSIS — N2581 Secondary hyperparathyroidism of renal origin: Secondary | ICD-10-CM | POA: Diagnosis not present

## 2020-10-29 DIAGNOSIS — N186 End stage renal disease: Secondary | ICD-10-CM | POA: Diagnosis not present

## 2020-10-29 DIAGNOSIS — D631 Anemia in chronic kidney disease: Secondary | ICD-10-CM | POA: Diagnosis not present

## 2020-10-29 DIAGNOSIS — E1129 Type 2 diabetes mellitus with other diabetic kidney complication: Secondary | ICD-10-CM | POA: Diagnosis not present

## 2020-10-29 DIAGNOSIS — Z992 Dependence on renal dialysis: Secondary | ICD-10-CM | POA: Diagnosis not present

## 2020-11-01 DIAGNOSIS — D509 Iron deficiency anemia, unspecified: Secondary | ICD-10-CM | POA: Diagnosis not present

## 2020-11-01 DIAGNOSIS — E1129 Type 2 diabetes mellitus with other diabetic kidney complication: Secondary | ICD-10-CM | POA: Diagnosis not present

## 2020-11-01 DIAGNOSIS — N186 End stage renal disease: Secondary | ICD-10-CM | POA: Diagnosis not present

## 2020-11-01 DIAGNOSIS — N2581 Secondary hyperparathyroidism of renal origin: Secondary | ICD-10-CM | POA: Diagnosis not present

## 2020-11-01 DIAGNOSIS — Z992 Dependence on renal dialysis: Secondary | ICD-10-CM | POA: Diagnosis not present

## 2020-11-01 DIAGNOSIS — D631 Anemia in chronic kidney disease: Secondary | ICD-10-CM | POA: Diagnosis not present

## 2020-11-03 DIAGNOSIS — Z992 Dependence on renal dialysis: Secondary | ICD-10-CM | POA: Diagnosis not present

## 2020-11-03 DIAGNOSIS — E1129 Type 2 diabetes mellitus with other diabetic kidney complication: Secondary | ICD-10-CM | POA: Diagnosis not present

## 2020-11-03 DIAGNOSIS — D509 Iron deficiency anemia, unspecified: Secondary | ICD-10-CM | POA: Diagnosis not present

## 2020-11-03 DIAGNOSIS — D631 Anemia in chronic kidney disease: Secondary | ICD-10-CM | POA: Diagnosis not present

## 2020-11-03 DIAGNOSIS — N186 End stage renal disease: Secondary | ICD-10-CM | POA: Diagnosis not present

## 2020-11-03 DIAGNOSIS — N2581 Secondary hyperparathyroidism of renal origin: Secondary | ICD-10-CM | POA: Diagnosis not present

## 2020-11-03 NOTE — Progress Notes (Signed)
New Breast Cancer Diagnosis: Recurrent Right Breast Cancer- UIQ  Patient prefers to be treated closer to home.  Did patient present with symptoms (if so, please note symptoms) or screening mammography?:Screening Mass    Breast US 09/28/2020: Mass-like abnormality in the upper inner aspect of the right anterior chest, designated at 1 o'clock, 7 cm from the central aspect of the mastectomy scar. This measures 4.1 cm in greatest dimension. This may reflect recurrent malignancy or postoperative change. 1.7 cm hypoechoic mass the central anterior right chest deep to the mid aspect of the mastectomy scar. This is concerning for recurrent breast malignancy.  PET 09/14/2020: Areas of nodularity about the RIGHT breast in this patient who isundergone previous lumpectomy x2 and RIGHT mastectomy. Most suspicious area is associated with discrete nodule in the fat of the inferior chest wall within the subcutaneous tissues.   Location and Extent of disease :right breast. Located at Upper Inner Right anterior Chest wall at 1 o'clock position, measured  4.1 cm in greatest dimension. 1.7 cm hypoechoic mass in the central anterior chest concerning for malignancy.    Histology per Pathology Report: grade 3, Invasive Ductal Carcinoma, DCIS  Right Breast Biopsy 10/12/2020   Receptor Status: ER(negative), PR (negative), Her2-neu (negative), Ki-(20%)  Surgeon and surgical plan, if any: Dr. Arnoldo Morale -Right Breast Biopsy  10/12/2020 -Right Partial Mastectomy 07/13/2020 -Left Simple Mastectomy 06/15/2020 -Right modified radical mastectomy 02/18/2020 -Right Lumpectomy with SLN biopsy 09/18/2018  Medical oncologist, treatment if any:   Dr. Delton Coombes 10/28/2020 -She had surgical resection of the recurrent right breast cancer which showed 1.3 cm triple negative breast cancer.  Margins were negative.  Associated DCIS was also resected. -Recommend adjuvant chemotherapy.  Ideally 4 cycles of TC will be.  However she has peripheral  neuropathy which is very bad. -She has received 4 cycles of AC 2 years ago in adjuvant setting.  He has done fairly well except she had some problems with drop in blood pressure particularly at dialysis. -I have called and talked to Dr. Marval Regal who will help [place] with case management of blood pressures. -Also recommend genetic testing.  If she harbors a BRCA1/2, she will be a candidate for olaparib. -We will also refer her to radiation therapy. -RTC 2 weeks for follow-up.  Will consider PICC line placement for chemotherapy. -We will plan to hold her anastrozole when chemotherapy started. -F/U 11/11/2020   Family History of Breast/Ovarian/Prostate Cancer: None  Lymphedema issues, if any:  No  Pain issues, if any:  Continues to have some tenderness at the previous surgical sites.  SAFETY ISSUES: Prior radiation? No Pacemaker/ICD? No Possible current pregnancy? Hysterectomy Is the patient on methotrexate? no   Dialysis: MWF, 6:15-6:30 am  Current Complaints / other details:   History 1. Stage Ib (T1CN0) right breast cancer, ER positive, PR and HER-2 negative: -Right lumpectomy and sentinel lymph node biopsy on 2/5/2020shows 1.5 cm IDC, grade 3, associated with high-grade DCIS, negative margins, 0/3 lymph nodes positive, ER 50%, PR negative and HER-2 negative, Ki-67 60%. -Oncotype DX recurrence score 47. Distant recurrence at 9 years with tamoxifen alone is 36%. Absolute chemotherapy benefit is more than 15%. -Adjuvant chemotherapy with 4 cycles of AC from 10/30/2018 through 01/01/2019. -She was started on anastrozole in June 2020. -Mammogram on 09/02/2019 showed calcifications in the posterior aspect of upper outer quadrant of the right breast. -Right mastectomy on 02/18/2020 shows high-grade DCIS, 1.5 cm, no invasive carcinoma, resection margins negative. 0/11 lymph nodes. ER 30% positive, PR negative. -Left  simple mastectomy on 06/15/2020 was benign. Right breast biopsy shows  microscopic focus of invasive ductal carcinoma in the background of extensive high-grade DCIS. -Right breast lumpectomy on 07/13/2020 shows multifocal invasive ductal carcinoma, grade 3, largest measuring 1.2 cm. Extensive high-grade DCIS with necrosis. Margins are free. PT1CNX. There are multiple foci of invasive carcinoma arising from extensive DCIS. Several small foci of invasive tumor arising from DCIS indicating new primaries.ER/PR/HER-2 is negative. Ki-67 is 20%.

## 2020-11-04 ENCOUNTER — Encounter: Payer: TRICARE For Life (TFL) | Admitting: Genetic Counselor

## 2020-11-04 ENCOUNTER — Other Ambulatory Visit: Payer: Self-pay

## 2020-11-04 ENCOUNTER — Encounter: Payer: Self-pay | Admitting: Radiation Oncology

## 2020-11-04 ENCOUNTER — Ambulatory Visit
Admission: RE | Admit: 2020-11-04 | Discharge: 2020-11-04 | Disposition: A | Discharge status: Home / Self Care | Payer: Medicare Other | Source: Ambulatory Visit | Attending: Radiation Oncology | Admitting: Radiation Oncology

## 2020-11-04 VITALS — Ht 62.0 in | Wt 167.0 lb

## 2020-11-04 DIAGNOSIS — Z170 Estrogen receptor positive status [ER+]: Secondary | ICD-10-CM | POA: Diagnosis not present

## 2020-11-04 DIAGNOSIS — C50211 Malignant neoplasm of upper-inner quadrant of right female breast: Secondary | ICD-10-CM

## 2020-11-04 NOTE — Progress Notes (Signed)
Radiation Oncology         [phone] ________________________________  Initial Outpatient Consultation - Conducted via telephone due to current COVID-19 concerns for limiting patient exposure  I spoke with the patient to conduct this consult visit via telephone to spare the patient unnecessary potential exposure in the healthcare setting during the current COVID-19 pandemic. The patient was notified in advance and was offered a [facility] meeting to allow for face to face communication but unfortunately reported that they did not have the appropriate resources/technology to support such a visit and instead preferred to proceed with a telephone consult.    Name: Megan [name]        MRN: [id number]  Date of Service: 11/04/2020 DOB: [birth year]  LE:[id number], Cammie Mcgee, MD    REFERRING PHYSICIAN: Dr. Delton Coombes  DIAGNOSIS: The encounter diagnosis was Malignant neoplasm of upper-inner quadrant of right breast in female, estrogen receptor positive ([place]).   HISTORY OF PRESENT ILLNESS: Megan [name] is a 60 y.o. female seen at the request of Dr. Delton Coombes for recurrent breast cancer.  The patient was originally diagnosed with a stage Ib ER positive grade 3 invasive ductal carcinoma of the right breast for which she underwent a right partial mastectomy and sentinel lymph node biopsy, 3 sampled lymph nodes were negative, her Oncotype score was 47 so she received adjuvant chemotherapy between March and May 2020 she began anastrozole.  Her mammogram in January 2021 showed calcifications in the posterior aspect of the upper outer quadrant and she underwent biopsy that showed high-grade DCIS with central necrosis and calcifications.  She ultimately underwent a mastectomy on 02/18/2020 as well as sentinel node biopsy, her mastectomy showed DCIS that was high-grade with central necrosis and calcifications measuring a total of 1.5 cm her resection margins were negative and 11 sampled lymph nodes were  negative as well.  She decided to undergo left mastectomy on 06/15/2020 which was benign.  In January 2021 diagnostic imaging revealed concerns for a new primary in the right chest.  She underwent a lumpectomy on 07/13/2020 that showed multifocal invasive ductal carcinoma grade 3 the largest measuring 1.2 cm with extensive high-grade DCIS and necrosis, margins were clear but there were multiple foci of invasive disease extending from the DCIS her cancer was triple negative.  She ultimately underwent reexcision of this on 10/13/2019 after additional PET and ultrasound confirmed concerns for chest wall disease.  The pathology from 10/12/2020 showed a 1.3 cm invasive and in situ ductal carcinoma focally 1 mm from the superior margin the DCIS was less than 1 mm from the superior margin as well.  Given these findings and the high risk nature of her recurrent disease, she is seen to discuss options of adjuvant radiotherapy.   PREVIOUS RADIATION THERAPY: No   PAST MEDICAL HISTORY:  Past Medical History:  Diagnosis Date  . (HFpEF) heart failure with preserved ejection fraction ([place])    a. 01/2019 Echo: EF 55-60%, mild conc LVH. DD.  Torn MV chordae.  . Anemia   . Atypical chest pain    a. 08/2018 MV: EF 59%, no ischemia; b. 02/2019 Cath: nonobs dzs.  . Blood transfusion without reported diagnosis   . Breast cancer ([place]) 10/12/2020  . Cataract   . ESRD (end stage renal disease) on dialysis ([place])    a. HD T, T, S  . Essential hypertension, benign   . GERD (gastroesophageal reflux disease)   . Headache   . Hemorrhoids   . Mixed hyperlipidemia   .  Morbid obesity ([place])   . Non-obstructive CAD (coronary artery disease)    a. 02/2019 CathL LM nl, LAD 46m, LCX nl, RCA 25p, EF 55-65%.  Marland Kitchen PONV (postoperative nausea and vomiting)   . S/P colonoscopy Jan 2011   Dr. Benson Norway: sessile polyp (benign lymphoid), large hemorrhoids, repeat 5-10 years  . Temporal arteritis ([place])   . Type 2 diabetes mellitus ([place])   . Wears  glasses        PAST SURGICAL HISTORY: Past Surgical History:  Procedure Laterality Date  . ABDOMINAL HYSTERECTOMY    . APPENDECTOMY    . ARTERY BIOPSY N/A 05/09/2018   Procedure: RIGHT TEMPORAL ARTERY BIOPSY;  Surgeon: Judeth Horn, MD;  Location: [facility];  Service: General;  Laterality: N/A;  . BREAST BIOPSY Right 06/15/2020   Procedure: RIGHT BREAST BIOPSY;  Surgeon: Aviva Signs, MD;  Location: [facility];  Service: General;  Laterality: Right;  . CATARACT EXTRACTION W/PHACO Left 02/09/2017   Procedure: CATARACT EXTRACTION PHACO AND INTRAOCULAR LENS PLACEMENT LEFT EYE;  Surgeon: Tonny Branch, MD;  Location: [facility];  Service: Ophthalmology;  Laterality: Left;  CDE: 4.89  . CATARACT EXTRACTION W/PHACO Right 06/04/2017   Procedure: CATARACT EXTRACTION PHACO AND INTRAOCULAR LENS PLACEMENT (IOC);  Surgeon: Tonny Branch, MD;  Location: [facility];  Service: Ophthalmology;  Laterality: Right;  CDE: 4.12  . CHOLECYSTECTOMY  09/29/2011   Procedure: LAPAROSCOPIC CHOLECYSTECTOMY;  Surgeon: Jamesetta So, MD;  Location: [facility];  Service: General;  Laterality: N/A;  . COLONOSCOPY  Jan 2011   Dr. Benson Norway: sessile polyp (benign lymphoid), large hemorrhoids, repeat 5-10 years  . COLONOSCOPY N/A 06/12/2016   Procedure: COLONOSCOPY;  Surgeon: Daneil Dolin, MD;  [facility];  Service: Endoscopy;  Laterality: N/A;  1230   . ESOPHAGOGASTRODUODENOSCOPY  09/05/2011   HWE:XHBZJ hiatal hernia; remainder of exam normal. No explanation for patient's abdominal pain with today's examination  . ESOPHAGOGASTRODUODENOSCOPY N/A 12/17/2013   Dr. [name]: gastric erythema, erosion, mild chronic inflammation on path   . EXCISION OF BREAST BIOPSY Right 10/12/2020   Procedure: EXCISION OF RIGHT BREAST BIOPSY;  Surgeon: Aviva Signs, MD;  Location: [facility];  Service: General;  Laterality: Right;  . LAPAROSCOPIC APPENDECTOMY  09/29/2011   Procedure: APPENDECTOMY LAPAROSCOPIC;  Surgeon: Jamesetta So, MD;  Location[facility];   Service: General;;  incidental appendectomy  . LEFT HEART CATH AND CORONARY ANGIOGRAPHY N/A 02/28/2019   Procedure: LEFT HEART CATH AND CORONARY ANGIOGRAPHY;  Surgeon: Jettie Booze, MD;  Location: [facility];  Service: Cardiovascular;  Laterality: N/A;  . MASTECTOMY MODIFIED RADICAL Right 02/18/2020   Procedure: MASTECTOMY MODIFIED RADICAL;  Surgeon: Aviva Signs, MD;  [facility];  Service: General;  Laterality: Right;  . MASTECTOMY, PARTIAL Right 07/13/2020   Procedure: RIGHT PARTIAL MASTECTOMY;  Surgeon: Aviva Signs, MD;  Location[facility];  Service: General;  Laterality: Right;  . PARTIAL MASTECTOMY WITH NEEDLE LOCALIZATION AND AXILLARY SENTINEL LYMPH NODE BX Right 09/18/2018   Procedure: RIGHT PARTIAL MASTECTOMY AFTER NEEDLE LOCALIZATION, SENTINEL LYMPH NODE BIOPSY RIGHT AXILLA;  Surgeon: Aviva Signs, MD;  [facility];  Service: General;  Laterality: Right;  . SIMPLE MASTECTOMY WITH AXILLARY SENTINEL NODE BIOPSY Left 06/15/2020   Procedure: LEFT SIMPLE MASTECTOMY;  Surgeon: Aviva Signs, MD;  Location[facility];  Service: General;  Laterality: Left;     FAMILY HISTORY:  Family History  Problem Relation Age of Onset  . Hypertension Mother   . Coronary artery disease Mother   .  Diabetes Mother   . Hypertension Sister   . Coronary artery disease Sister   . Hypertension Brother   . Heart attack Father   . Hypertension Son   . Heart attack Maternal Aunt   . Hypertension Maternal Aunt   . Diabetes Maternal Aunt   . Heart attack Maternal Uncle   . Hypertension Maternal Uncle   . Diabetes Maternal Uncle   . Heart attack Paternal Aunt   . Hypertension Paternal Aunt   . Diabetes Paternal Aunt   . Heart attack Paternal Uncle   . Hypertension Paternal Uncle   . Diabetes Paternal Uncle   . Heart attack Maternal Grandmother   . Heart attack Maternal Grandfather   . Heart attack Paternal Grandmother   . Heart attack Paternal Grandfather   . Colon cancer Neg Hx       SOCIAL HISTORY:  reports that she has never smoked. She has never used smokeless tobacco. She reports that she does not drink alcohol and does not use drugs. The patient is married and lives in [place].    ALLERGIES: Patient has no known allergies.   MEDICATIONS:  Current Outpatient Medications  Medication Sig Dispense Refill  . amLODipine (NORVASC) 10 MG tablet TAKE 2 TABLETS(20 MG) BY MOUTH AT BEDTIME (Patient taking differently: Take 10 mg by mouth in the morning and at bedtime.) 60 tablet 3  . anastrozole (ARIMIDEX) 1 MG tablet TAKE 1 TABLET(1 MG) BY MOUTH DAILY 30 tablet 4  . aspirin EC 81 MG tablet Take 81 mg by mouth daily.    Marland Kitchen atorvastatin (LIPITOR) 20 MG tablet Take 1 tablet (20 mg total) by mouth daily. (Patient taking differently: Take 20 mg by mouth at bedtime.) 90 tablet 3  . B Complex-C-Zn-Folic Acid (DIALYVITE 161-WRUE 15) 0.8 MG TABS Take 1 tablet by mouth daily.    . cinacalcet (SENSIPAR) 30 MG tablet Take 2 tablets (60 mg total) by mouth daily. (Patient taking differently: Take 30 mg by mouth daily with supper.) 90 tablet 0  . Darbepoetin Alfa (ARANESP, ALBUMIN FREE, IJ) Darbepoetin Alfa (Aranesp)    . DULoxetine (CYMBALTA) 30 MG capsule TAKE 1 CAPSULE(30 MG) BY MOUTH DAILY (Patient taking differently: Take 30 mg by mouth daily.) 90 capsule 1  . gabapentin (NEURONTIN) 300 MG capsule TAKE 1 CAPSULE(300 MG) BY MOUTH AT BEDTIME (Patient taking differently: Take 300 mg by mouth at bedtime.) 90 capsule 1  . HYDROcodone-acetaminophen (NORCO) 5-325 MG tablet Take 1 tablet by mouth every 6 (six) hours as needed for moderate pain. 20 tablet 0  . iron sucrose in sodium chloride 0.9 % 100 mL Iron Sucrose (Venofer)    . lanthanum (FOSRENOL) 1000 MG chewable tablet Chew 2,000-3,000 mg by mouth See admin instructions. Take 3 tablets (3000 mg) by mouth with meals and take 2 tablets (2000 mg) with snacks    . lidocaine-prilocaine (EMLA) cream Apply 1 application topically every  Monday, Wednesday, and Friday with hemodialysis.     Marland Kitchen nebivolol (BYSTOLIC) 10 MG tablet Take 1 tablet (10 mg total) by mouth in the morning and at bedtime. 180 tablet 3  . nebivolol (BYSTOLIC) 10 MG tablet     . ondansetron (ZOFRAN) 4 MG tablet Take 1 tablet (4 mg total) by mouth every 8 (eight) hours as needed for nausea or vomiting. 20 tablet 1  . pantoprazole (PROTONIX) 40 MG tablet TAKE 1 TABLET(40 MG) BY MOUTH DAILY (Patient taking differently: Take 40 mg by mouth daily.) 90 tablet 3  . tiZANidine (ZANAFLEX)  4 MG tablet Take 4 mg by mouth daily.     No current facility-administered medications for this encounter.     REVIEW OF SYSTEMS: On review of systems, the patient reports that she is doing okay since her last surgery. She denies any significant concerns about her healing at this time. She feels cramping, soreness and significant fatigue on the days she has HD. She is on transplant list at [facility] but this is currently on hold due to her cancer diagnosis. No other complaints are verbalized.    PHYSICAL EXAM:  Wt Readings from Last 3 Encounters:  11/04/20 167 lb (75.8 kg)  10/28/20 163 lb (73.9 kg)  10/12/20 156 lb 1.4 oz (70.8 kg)   Unable to assess due to encounter type.  ECOG = 0  0 - Asymptomatic (Fully active, able to carry on all predisease activities without restriction)  1 - Symptomatic but completely ambulatory (Restricted in physically strenuous activity but ambulatory and able to carry out work of a light or sedentary nature. For example, light housework, office work)  2 - Symptomatic, <50% in bed during the day (Ambulatory and capable of all self care but unable to carry out any work activities. Up and about more than 50% of waking hours)  3 - Symptomatic, >50% in bed, but not bedbound (Capable of only limited self-care, confined to bed or chair 50% or more of waking hours)  4 - Bedbound (Completely disabled. Cannot carry on any self-care. Totally confined to bed or  chair)  5 - Death   Eustace Pen MM, Creech RH, Tormey DC, et al. [phone]). "Toxicity and response criteria of the [facility] Group". [place] Oncol. 5 (6): 649-55    LABORATORY DATA:  Lab Results  Component Value Date   WBC 8.7 10/28/2020   HGB 11.5 (L) 10/28/2020   HCT 36.8 10/28/2020   MCV 93.2 10/28/2020   PLT 257 10/28/2020   Lab Results  Component Value Date   NA 139 10/28/2020   K 4.3 10/28/2020   CL 100 10/28/2020   CO2 26 10/28/2020   Lab Results  Component Value Date   ALT 19 10/28/2020   AST 17 10/28/2020   ALKPHOS 82 10/28/2020   BILITOT 0.5 10/28/2020      RADIOGRAPHY: No results found.     IMPRESSION/PLAN: 1. Recurrent right breast carcinoma originally with Stage I, ER positive disease, recurrence with DCIS s/p mastectomy, followed by recurrent invasive triple negative disease. Dr. Lisbeth Renshaw has reveiwed her case and we discussed her course, and prior therapy. We reviewed the most recent pathology findings. I reviewed the nature of recurrent and now triple negative breast disease with the patient and her husband. She has been offered chemotherapy but is still not certain that she is willing to take this again given her prior experience and ongoing HD. We discussed the rational for external radiotherapy to the chest wall to reduce risks of local recurrence and the patient is interested in proceeding but closer to home in [place]. We discussed the risks, benefits, short, and long term effects of radiotherapy, as well as the curative intent, and the patient is interested in proceeding. Dr. Lisbeth Renshaw would recommend 6 1/2 weeks of radiotherapy to the chest wall but would not treat the regional nodes as her nodes have been negative with each prior sampling. She is in agreement with referral and this was sent to the team at [facility]. 2. ESRD on HD. The patient will continue  with Dr. Marval Regal and the doctors at [facility] and  also work with her transplant team at [facility].   Given current concerns for patient exposure during the COVID-19 pandemic, this encounter was conducted via telephone.  The patient has provided two factor identification and has given verbal consent for this type of encounter and has been advised to only accept a meeting of this type in a secure network environment. The time spent during this encounter was 60 minutes including preparation, discussion, and coordination of the patient's care. The attendants for this meeting include Blenda Nicely, RN, Hayden Pedro and ASANTI CRAIGO and her husband Heydi Swango.  During the encounter,  Blenda Nicely, RN, and Hayden Pedro were located at [facility] Radiation Oncology Department.  [name] and her husband Fae Blossom was located at home.      Carola Rhine, [facility]   **Disclaimer: This note was dictated with voice recognition software. Similar sounding words can inadvertently be transcribed and this note may contain transcription errors which may not have been corrected upon publication of note.**

## 2020-11-05 ENCOUNTER — Encounter: Payer: Self-pay | Admitting: General Practice

## 2020-11-05 DIAGNOSIS — N2581 Secondary hyperparathyroidism of renal origin: Secondary | ICD-10-CM | POA: Diagnosis not present

## 2020-11-05 DIAGNOSIS — Z992 Dependence on renal dialysis: Secondary | ICD-10-CM | POA: Diagnosis not present

## 2020-11-05 DIAGNOSIS — D509 Iron deficiency anemia, unspecified: Secondary | ICD-10-CM | POA: Diagnosis not present

## 2020-11-05 DIAGNOSIS — E1129 Type 2 diabetes mellitus with other diabetic kidney complication: Secondary | ICD-10-CM | POA: Diagnosis not present

## 2020-11-05 DIAGNOSIS — D631 Anemia in chronic kidney disease: Secondary | ICD-10-CM | POA: Diagnosis not present

## 2020-11-05 DIAGNOSIS — N186 End stage renal disease: Secondary | ICD-10-CM | POA: Diagnosis not present

## 2020-11-05 NOTE — Progress Notes (Signed)
[facility] Psychosocial Distress Screening Clinical Social Work  Clinical Social Work was referred by distress screening protocol.  The patient scored a 5 on the Psychosocial Distress Thermometer which indicates moderate distress. Clinical Social Worker contacted patient by phone to assess for distress and other psychosocial needs. She has been dealing w cancer since 2020, she does not want to pursue chemotherapy.  She struggled with blood pressure issues during her last round of treatments, neither she nor her husband want to repeat this treatment. She does want to pursue radiation therapy and will do that at [facility] as it is closer to their home.  She reports great support from her husband, sister, family, friends and church family.  No practical or emotional needs at this time.  She knows to call us if we can be of any assistance to her.    ONCBCN DISTRESS SCREENING 11/04/2020  Screening Type Initial Screening  Distress experienced in past week (1-10) 5  Emotional problem type Adjusting to illness  Physical Problem type   Physician notified of physical symptoms   Referral to clinical psychology   Referral to dietition   Referral to financial advocate   Referral to support programs     Clinical Social Worker follow up needed: No.  If yes, follow up plan:   Beverely Pace, [place], LCSW Clinical Social Worker Phone:  [phone]

## 2020-11-08 ENCOUNTER — Telehealth: Payer: Self-pay | Admitting: *Deleted

## 2020-11-08 DIAGNOSIS — E1129 Type 2 diabetes mellitus with other diabetic kidney complication: Secondary | ICD-10-CM | POA: Diagnosis not present

## 2020-11-08 DIAGNOSIS — N186 End stage renal disease: Secondary | ICD-10-CM | POA: Diagnosis not present

## 2020-11-08 DIAGNOSIS — Z992 Dependence on renal dialysis: Secondary | ICD-10-CM | POA: Diagnosis not present

## 2020-11-08 DIAGNOSIS — D631 Anemia in chronic kidney disease: Secondary | ICD-10-CM | POA: Diagnosis not present

## 2020-11-08 DIAGNOSIS — N2581 Secondary hyperparathyroidism of renal origin: Secondary | ICD-10-CM | POA: Diagnosis not present

## 2020-11-08 DIAGNOSIS — D509 Iron deficiency anemia, unspecified: Secondary | ICD-10-CM | POA: Diagnosis not present

## 2020-11-08 NOTE — Telephone Encounter (Signed)
RECEIVED PHONE CALL FROM [facility] AND THIS PATIENT WILL BE SEEN ON 11-09-20 FOR RT

## 2020-11-09 DIAGNOSIS — C50211 Malignant neoplasm of upper-inner quadrant of right female breast: Secondary | ICD-10-CM | POA: Diagnosis not present

## 2020-11-10 ENCOUNTER — Other Ambulatory Visit: Payer: Self-pay

## 2020-11-10 ENCOUNTER — Inpatient Hospital Stay (HOSPITAL_BASED_OUTPATIENT_CLINIC_OR_DEPARTMENT_OTHER): Payer: Medicare Other | Admitting: Hematology

## 2020-11-10 VITALS — BP 126/68 | HR 84 | Temp 97.0°F | Resp 18 | Wt 161.8 lb

## 2020-11-10 DIAGNOSIS — E1129 Type 2 diabetes mellitus with other diabetic kidney complication: Secondary | ICD-10-CM | POA: Diagnosis not present

## 2020-11-10 DIAGNOSIS — D631 Anemia in chronic kidney disease: Secondary | ICD-10-CM | POA: Diagnosis not present

## 2020-11-10 DIAGNOSIS — Z992 Dependence on renal dialysis: Secondary | ICD-10-CM | POA: Diagnosis not present

## 2020-11-10 DIAGNOSIS — C50211 Malignant neoplasm of upper-inner quadrant of right female breast: Secondary | ICD-10-CM | POA: Diagnosis not present

## 2020-11-10 DIAGNOSIS — D509 Iron deficiency anemia, unspecified: Secondary | ICD-10-CM | POA: Diagnosis not present

## 2020-11-10 DIAGNOSIS — N2581 Secondary hyperparathyroidism of renal origin: Secondary | ICD-10-CM | POA: Diagnosis not present

## 2020-11-10 DIAGNOSIS — Z79811 Long term (current) use of aromatase inhibitors: Secondary | ICD-10-CM | POA: Diagnosis not present

## 2020-11-10 DIAGNOSIS — M8580 Other specified disorders of bone density and structure, unspecified site: Secondary | ICD-10-CM | POA: Diagnosis not present

## 2020-11-10 DIAGNOSIS — N186 End stage renal disease: Secondary | ICD-10-CM | POA: Diagnosis not present

## 2020-11-10 DIAGNOSIS — Z170 Estrogen receptor positive status [ER+]: Secondary | ICD-10-CM | POA: Diagnosis not present

## 2020-11-10 DIAGNOSIS — G629 Polyneuropathy, unspecified: Secondary | ICD-10-CM | POA: Diagnosis not present

## 2020-11-10 NOTE — Patient Instructions (Signed)
[place] at [facility] Discharge Instructions  You were seen today by Dr. Delton Coombes. He went over your recent results. Your breast cancer is now triple-negative (estrogen receptor, progesterone receptor, and HER-2 receptor negative) which can be treated with chemotherapy and radiation; your previous breast cancer was estrogen receptor positive, which was treated with Arimidex (estrogen suppressor). Radiation is a viable treatment option now given your kidney disease. Continue taking Arimidex unless you begin chemotherapy again. Dr. Delton Coombes will call you in 1 week for follow up.   Thank you for choosing [place] at [facility] to provide your oncology and hematology care.  To afford each patient quality time with our provider, please arrive at least 15 minutes before your scheduled appointment time.   If you have a lab appointment with the [facility] please come in thru the Main Entrance and check in at the main information desk  You need to re-schedule your appointment should you arrive 10 or more minutes late.  We strive to give you quality time with our providers, and arriving late affects you and other patients whose appointments are after yours.  Also, if you no show three or more times for appointments you may be dismissed from the clinic at the providers discretion.     Again, thank you for choosing [facility].  Our hope is that these requests will decrease the amount of time that you wait before being seen by our physicians.       _____________________________________________________________  Should you have questions after your visit to [facility], please contact our office at [phone] between the hours of 8:00 a.m. and 4:30 p.m.  Voicemails left after 4:00 p.m. will not be returned until the following business day.  For prescription refill requests, have your pharmacy contact our office and allow 72  hours.    Cancer Center Support Programs:   > Cancer Support Group  2nd Tuesday of the month 1pm-2pm, Journey Room

## 2020-11-10 NOTE — Progress Notes (Signed)
[facility] [address] [place] [address]   Patient Care Team: [name], MD as PCP - General (Family Medicine) [name], [name], MD as PCP - Cardiology (Cardiology) [name] [name], MD (Gastroenterology) [name], MD as Consulting Physician (Cardiology) [name], RN as Oncology Nurse Navigator (Oncology)  SUMMARY OF ONCOLOGIC HISTORY: Oncology History  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[facility])  09/18/2018 Initial Diagnosis   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[facility])   09/26/2018 Cancer Staging   Staging form: Breast, AJCC 8th Edition - Clinical stage from 09/26/2018: Stage IB (cT1c, cN0, cM0, G3, ER+, PR-, HER2-) - Signed by Derek Jack, MD on 09/26/2018   10/30/2018 -  Chemotherapy   The patient had DOXOrubicin (ADRIAMYCIN) chemo injection 108 mg, 60 mg/m2 = 108 mg, Intravenous,  Once, 4 of 4 cycles Administration: 108 mg (10/30/2018), 108 mg (11/20/2018), 108 mg (12/11/2018), 108 mg (01/01/2019) palonosetron (ALOXI) injection 0.25 mg, 0.25 mg, Intravenous,  Once, 4 of 4 cycles Administration: 0.25 mg (10/30/2018), 0.25 mg (11/20/2018), 0.25 mg (12/11/2018), 0.25 mg (01/01/2019) pegfilgrastim (NEULASTA ONPRO KIT) injection 6 mg, 6 mg, Subcutaneous, Once, 4 of 4 cycles Administration: 6 mg (10/30/2018), 6 mg (11/20/2018), 6 mg (12/11/2018), 6 mg (01/01/2019) cyclophosphamide (CYTOXAN) 820 mg in sodium chloride 0.9 % 250 mL chemo infusion, 450 mg/m2 = 820 mg (75 % of original dose 600 mg/m2), Intravenous,  Once, 4 of 4 cycles Dose modification: 450 mg/m2 (75 % of original dose 600 mg/m2, Cycle 1, Reason: Other (see comments), Comment: hemodialysis) Administration: 820 mg (10/30/2018), 820 mg (11/20/2018), 820 mg (12/11/2018), 820 mg (01/01/2019) fosaprepitant (EMEND) 150 mg, dexamethasone (DECADRON) 12 mg in sodium chloride 0.9 % 145 mL IVPB, , Intravenous,  Once, 4 of 4  cycles Administration:  (10/30/2018),  (11/20/2018),  (12/11/2018),  (01/01/2019)  for chemotherapy treatment.      CHIEF COMPLIANT: Follow-up for right breast cancer   INTERVAL HISTORY: [name] is a 60 y.o. female here today for follow up of her right breast cancer. Her last visit was on 10/28/2020.   Today she is accompanied by her husband and she reports feeling okay. She notes that she had fainting spells during her last chemo treatment, as well as during HD when her BP drops and the last episode of fainting was on 03/28. She continues going to HD on M/W/F with Dr. Marval Regal. She is taking 3 meds for her BP.  She had a follow-up with Dr. Adella Nissen on 03/29 to discuss radiation.    REVIEW OF SYSTEMS:   Review of Systems  Constitutional: Positive for fatigue (75%). Negative for appetite change.  Cardiovascular: Positive for chest pain (4/10 chest wall pain).  All other systems reviewed and are negative.   I have reviewed the past medical history, past surgical history, social history and family history with the patient and they are unchanged from previous note.   ALLERGIES:   has No Known Allergies.   MEDICATIONS:  Current Outpatient Medications  Medication Sig Dispense Refill  . B Complex-C-Folic Acid (DIALYVITE 875) 0.8 MG TABS Take 1 tablet by mouth daily.    . benzonatate (TESSALON) 200 MG capsule Take by mouth.    . hydrALAZINE (APRESOLINE) 25 MG tablet Take by mouth.    . losartan (COZAAR) 100 MG tablet Take 1 tablet by mouth daily.    Marland Kitchen amLODipine (NORVASC) 10 MG tablet TAKE 2 TABLETS(20 MG) BY MOUTH AT  BEDTIME (Patient taking differently: Take 10 mg by mouth in the morning and at bedtime.) 60 tablet 3  . anastrozole (ARIMIDEX) 1 MG tablet TAKE 1 TABLET(1 MG) BY MOUTH DAILY 30 tablet 4  . aspirin EC 81 MG tablet Take 81 mg by mouth daily.    Marland Kitchen atorvastatin (LIPITOR) 20 MG tablet Take 1 tablet (20 mg total) by mouth daily. (Patient taking differently: Take 20  mg by mouth at bedtime.) 90 tablet 3  . B Complex-C-Zn-Folic Acid (DIALYVITE 397-QBHA 15) 0.8 MG TABS Take 1 tablet by mouth daily.    . cinacalcet (SENSIPAR) 30 MG tablet Take 2 tablets (60 mg total) by mouth daily. (Patient taking differently: Take 30 mg by mouth daily with supper.) 90 tablet 0  . Darbepoetin Alfa (ARANESP, ALBUMIN FREE, IJ) Darbepoetin Alfa (Aranesp)    . DULoxetine (CYMBALTA) 30 MG capsule TAKE 1 CAPSULE(30 MG) BY MOUTH DAILY (Patient taking differently: Take 30 mg by mouth daily.) 90 capsule 1  . gabapentin (NEURONTIN) 300 MG capsule TAKE 1 CAPSULE(300 MG) BY MOUTH AT BEDTIME (Patient taking differently: Take 300 mg by mouth at bedtime.) 90 capsule 1  . glipiZIDE (GLUCOTROL XL) 5 MG 24 hr tablet Take 1 tablet by mouth daily.    Marland Kitchen HYDROcodone-acetaminophen (NORCO) 5-325 MG tablet Take 1 tablet by mouth every 6 (six) hours as needed for moderate pain. 20 tablet 0  . iron sucrose in sodium chloride 0.9 % 100 mL Iron Sucrose (Venofer)    . lanthanum (FOSRENOL) 1000 MG chewable tablet Chew 2,000-3,000 mg by mouth See admin instructions. Take 3 tablets (3000 mg) by mouth with meals and take 2 tablets (2000 mg) with snacks    . lidocaine-prilocaine (EMLA) cream Apply 1 application topically every Monday, Wednesday, and Friday with hemodialysis.     Marland Kitchen nebivolol (BYSTOLIC) 10 MG tablet Take 1 tablet (10 mg total) by mouth in the morning and at bedtime. 180 tablet 3  . nebivolol (BYSTOLIC) 10 MG tablet     . ondansetron (ZOFRAN) 4 MG tablet Take 1 tablet (4 mg total) by mouth every 8 (eight) hours as needed for nausea or vomiting. 20 tablet 1  . pantoprazole (PROTONIX) 40 MG tablet TAKE 1 TABLET(40 MG) BY MOUTH DAILY (Patient taking differently: Take 40 mg by mouth daily.) 90 tablet 3  . tiZANidine (ZANAFLEX) 4 MG tablet Take 4 mg by mouth daily.     No current facility-administered medications for this visit.     PHYSICAL EXAMINATION: Performance status (ECOG): 1 - Symptomatic but  completely ambulatory  Vitals:   11/10/20 1548  BP: 126/68  Pulse: 84  Resp: 18  Temp: (!) 97 F (36.1 C)  SpO2: 100%   Wt Readings from Last 3 Encounters:  11/10/20 161 lb 12.8 oz (73.4 kg)  11/04/20 167 lb (75.8 kg)  10/28/20 163 lb (73.9 kg)   Physical Exam  Breast Exam Chaperone: Milinda Antis, MD     LABORATORY DATA:  I have reviewed the data as listed CMP Latest Ref Rng & Units 10/28/2020 10/12/2020 09/07/2020  Glucose 70 - 99 mg/dL 108(H) 97 112(H)  BUN 6 - 20 mg/dL 44(H) 59(H) 42(H)  Creatinine 0.44 - 1.00 mg/dL 8.11(H) 8.05(H) 7.64(H)  Sodium 135 - 145 mmol/L 139 137 139  Potassium 3.5 - 5.1 mmol/L 4.3 3.8 3.1(L)  Chloride 98 - 111 mmol/L 100 94(L) 101  CO2 22 - 32 mmol/L _0 Calcium 8.9 - 10.3 mg/dL 9.8 9.4 10.5(H)  Total Protein 6.5 -  8.1 g/dL 7.9 - 7.5  Total Bilirubin 0.3 - 1.2 mg/dL 0.5 - 0.6  Alkaline Phos 38 - 126 U/L 82 - 83  AST 15 - 41 U/L 17 - 17  ALT 0 - 44 U/L 19 - 20   No results found for: JOI325 Lab Results  Component Value Date   WBC 8.7 10/28/2020   HGB 11.5 (L) 10/28/2020   HCT 36.8 10/28/2020   MCV 93.2 10/28/2020   PLT 257 10/28/2020   NEUTROABS 5.5 10/28/2020    ASSESSMENT:  1. Stage Ib (T1CN0) right breast cancer, ER positive, PR and HER-2 negative: -Right lumpectomy and sentinel lymph node biopsy on 2/5/2020shows 1.5 cm IDC, grade 3, associated with high-grade DCIS, negative margins, 0/3 lymph nodes positive, ER 50%, PR negative and HER-2 negative, Ki-67 60%. -Oncotype DX recurrence score 47. Distant recurrence at 9 years with tamoxifen alone is 36%. Absolute chemotherapy benefit is more than 15%. -Adjuvant chemotherapy with 4 cycles of AC from 10/30/2018 through 01/01/2019. -She was started on anastrozole in June 2020. -Mammogram on 09/02/2019 showed calcifications in the posterior aspect of upper outer quadrant of the right breast. -Right mastectomy on 02/18/2020 shows high-grade DCIS, 1.5 cm, no invasive carcinoma,  resection margins negative. 0/11 lymph nodes. ER 30% positive, PR negative. -Left simple mastectomy on 06/15/2020 was benign. Right breast biopsy shows microscopic focus of invasive ductal carcinoma in the background of extensive high-grade DCIS. -Right breast lumpectomy on 07/13/2020 shows multifocal invasive ductal carcinoma, grade 3, largest measuring 1.2 cm. Extensive high-grade DCIS with necrosis. Margins are free. PT1CNX. There are multiple foci of invasive carcinoma arising from extensive DCIS. Several small foci of invasive tumor arising from DCIS indicating new primaries.ER/PR/HER-2 is negative. Ki-67 is 20%. -PET scan on 09/14/2020 shows areas of nodularity with discrete nodule in the fat of the inferior chest wall within the subcutaneous tissues. -Ultrasound of the right chest wall shows masslike abnormality in the upper inner aspect of the right anterior chest at 1 o'clock position measuring 4.1 cm in greatest dimension. 1.7 cm hypoechoic mass in the central anterior right chest concerning for malignancy. Possible borderline enlarged residual right axillary lymph node. -Right breast biopsy on 10/12/2020 shows 1.3 cm invasive ductal carcinoma, focally 0.1 cm from superior margin.  DCIS is less than 0.1 cm from superior margin.  PT1CPNX.  2. Osteopenia: -Bone density on 01/20/2019 shows T score -1.9.   PLAN:  1.Recurrent right breast cancer, triple receptor negative: -We discussed the negative genetic testing result. -I have recommended adjuvant chemotherapy given the triple negative cancer.  However she had difficulties tolerating it the first time. -She has decided not to do chemotherapy.  She will proceed with radiation therapy. -Given her initial breast cancer was ER positive, I have recommended continuing anastrozole. -Patient and her husband would proceed with radiation followed by close monitoring. -RTC 3 months for follow-up.   2. Osteopenia: -We will plan to  repeat DEXA scan in 2 years.  3. ESRD on HD: -She is undergoing hemodialysis under the direction of Dr. Marval Regal on Monday, Wednesday and Friday.  4. Neuropathy: -Neuropathy of unknown etiology with burning sensation in the bottom of the feet.  She is taking gabapentin at bedtime.    Breast Cancer therapy associated bone loss: I have recommended calcium, Vitamin D and weight bearing exercises.   No orders of the defined types were placed in this encounter.  The patient has a good understanding of the overall plan. she agrees with it. she will call  with any problems that may develop before the next visit here.    Derek Jack, MD [facility] [phone]   I, Milinda Antis, am acting as a scribe for Dr. Sanda Linger.  I, Derek Jack MD, have reviewed the above documentation for accuracy and completeness, and I agree with the above.

## 2020-11-11 ENCOUNTER — Encounter (HOSPITAL_COMMUNITY): Payer: Self-pay

## 2020-11-11 ENCOUNTER — Ambulatory Visit (HOSPITAL_COMMUNITY): Payer: TRICARE For Life (TFL) | Admitting: Hematology

## 2020-11-11 NOTE — Progress Notes (Signed)
Notification received from patient that she has chosen to proceed with radiation. Dr. Delton Coombes and [facility] aware. Patient reports that she is still taking her Arimidex, per Dr. Delton Coombes she is to continue Arimidex in addition to radiation. Patient verbalizes understanding of these instructions.

## 2020-11-12 ENCOUNTER — Telehealth: Payer: Self-pay | Admitting: Genetic Counselor

## 2020-11-12 DIAGNOSIS — E119 Type 2 diabetes mellitus without complications: Secondary | ICD-10-CM | POA: Diagnosis not present

## 2020-11-12 DIAGNOSIS — Z992 Dependence on renal dialysis: Secondary | ICD-10-CM | POA: Diagnosis not present

## 2020-11-12 DIAGNOSIS — D509 Iron deficiency anemia, unspecified: Secondary | ICD-10-CM | POA: Diagnosis not present

## 2020-11-12 DIAGNOSIS — N040 Nephrotic syndrome with minor glomerular abnormality: Secondary | ICD-10-CM | POA: Diagnosis not present

## 2020-11-12 DIAGNOSIS — N2581 Secondary hyperparathyroidism of renal origin: Secondary | ICD-10-CM | POA: Diagnosis not present

## 2020-11-12 DIAGNOSIS — N186 End stage renal disease: Secondary | ICD-10-CM | POA: Diagnosis not present

## 2020-11-12 DIAGNOSIS — E1129 Type 2 diabetes mellitus with other diabetic kidney complication: Secondary | ICD-10-CM | POA: Diagnosis not present

## 2020-11-12 NOTE — Telephone Encounter (Signed)
Ms. Ziska called to cancel her genetic counseling appointment scheduled for 11/16/20. She feels that she has a good understanding of her negative genetic test result and does not have questions about the family history, given that she is the only one who has had cancer. Appointment has been cancelled.

## 2020-11-15 DIAGNOSIS — N186 End stage renal disease: Secondary | ICD-10-CM | POA: Diagnosis not present

## 2020-11-15 DIAGNOSIS — E1129 Type 2 diabetes mellitus with other diabetic kidney complication: Secondary | ICD-10-CM | POA: Diagnosis not present

## 2020-11-15 DIAGNOSIS — N2581 Secondary hyperparathyroidism of renal origin: Secondary | ICD-10-CM | POA: Diagnosis not present

## 2020-11-15 DIAGNOSIS — E119 Type 2 diabetes mellitus without complications: Secondary | ICD-10-CM | POA: Diagnosis not present

## 2020-11-15 DIAGNOSIS — Z992 Dependence on renal dialysis: Secondary | ICD-10-CM | POA: Diagnosis not present

## 2020-11-15 DIAGNOSIS — D509 Iron deficiency anemia, unspecified: Secondary | ICD-10-CM | POA: Diagnosis not present

## 2020-11-16 ENCOUNTER — Inpatient Hospital Stay: Payer: Medicare Other | Admitting: Genetic Counselor

## 2020-11-17 ENCOUNTER — Telehealth (HOSPITAL_COMMUNITY): Payer: TRICARE For Life (TFL) | Admitting: Hematology

## 2020-11-17 DIAGNOSIS — E1129 Type 2 diabetes mellitus with other diabetic kidney complication: Secondary | ICD-10-CM | POA: Diagnosis not present

## 2020-11-17 DIAGNOSIS — N2581 Secondary hyperparathyroidism of renal origin: Secondary | ICD-10-CM | POA: Diagnosis not present

## 2020-11-17 DIAGNOSIS — E119 Type 2 diabetes mellitus without complications: Secondary | ICD-10-CM | POA: Diagnosis not present

## 2020-11-17 DIAGNOSIS — N186 End stage renal disease: Secondary | ICD-10-CM | POA: Diagnosis not present

## 2020-11-17 DIAGNOSIS — Z992 Dependence on renal dialysis: Secondary | ICD-10-CM | POA: Diagnosis not present

## 2020-11-17 DIAGNOSIS — D509 Iron deficiency anemia, unspecified: Secondary | ICD-10-CM | POA: Diagnosis not present

## 2020-11-19 DIAGNOSIS — N2581 Secondary hyperparathyroidism of renal origin: Secondary | ICD-10-CM | POA: Diagnosis not present

## 2020-11-19 DIAGNOSIS — Z992 Dependence on renal dialysis: Secondary | ICD-10-CM | POA: Diagnosis not present

## 2020-11-19 DIAGNOSIS — N186 End stage renal disease: Secondary | ICD-10-CM | POA: Diagnosis not present

## 2020-11-19 DIAGNOSIS — D509 Iron deficiency anemia, unspecified: Secondary | ICD-10-CM | POA: Diagnosis not present

## 2020-11-19 DIAGNOSIS — E1129 Type 2 diabetes mellitus with other diabetic kidney complication: Secondary | ICD-10-CM | POA: Diagnosis not present

## 2020-11-19 DIAGNOSIS — E119 Type 2 diabetes mellitus without complications: Secondary | ICD-10-CM | POA: Diagnosis not present

## 2020-11-22 DIAGNOSIS — E1129 Type 2 diabetes mellitus with other diabetic kidney complication: Secondary | ICD-10-CM | POA: Diagnosis not present

## 2020-11-22 DIAGNOSIS — N186 End stage renal disease: Secondary | ICD-10-CM | POA: Diagnosis not present

## 2020-11-22 DIAGNOSIS — Z992 Dependence on renal dialysis: Secondary | ICD-10-CM | POA: Diagnosis not present

## 2020-11-22 DIAGNOSIS — N2581 Secondary hyperparathyroidism of renal origin: Secondary | ICD-10-CM | POA: Diagnosis not present

## 2020-11-22 DIAGNOSIS — D509 Iron deficiency anemia, unspecified: Secondary | ICD-10-CM | POA: Diagnosis not present

## 2020-11-22 DIAGNOSIS — E119 Type 2 diabetes mellitus without complications: Secondary | ICD-10-CM | POA: Diagnosis not present

## 2020-11-23 ENCOUNTER — Other Ambulatory Visit: Payer: Self-pay | Admitting: Family Medicine

## 2020-11-24 DIAGNOSIS — D509 Iron deficiency anemia, unspecified: Secondary | ICD-10-CM | POA: Diagnosis not present

## 2020-11-24 DIAGNOSIS — N2581 Secondary hyperparathyroidism of renal origin: Secondary | ICD-10-CM | POA: Diagnosis not present

## 2020-11-24 DIAGNOSIS — Z992 Dependence on renal dialysis: Secondary | ICD-10-CM | POA: Diagnosis not present

## 2020-11-24 DIAGNOSIS — N186 End stage renal disease: Secondary | ICD-10-CM | POA: Diagnosis not present

## 2020-11-24 DIAGNOSIS — E119 Type 2 diabetes mellitus without complications: Secondary | ICD-10-CM | POA: Diagnosis not present

## 2020-11-24 DIAGNOSIS — E1129 Type 2 diabetes mellitus with other diabetic kidney complication: Secondary | ICD-10-CM | POA: Diagnosis not present

## 2020-11-25 ENCOUNTER — Other Ambulatory Visit: Payer: Self-pay

## 2020-11-25 ENCOUNTER — Ambulatory Visit (INDEPENDENT_AMBULATORY_CARE_PROVIDER_SITE_OTHER): Payer: Medicare Other | Admitting: Family Medicine

## 2020-11-25 VITALS — BP 124/78 | HR 78 | Temp 98.5°F | Resp 14 | Ht 62.0 in | Wt 167.0 lb

## 2020-11-25 DIAGNOSIS — E1122 Type 2 diabetes mellitus with diabetic chronic kidney disease: Secondary | ICD-10-CM

## 2020-11-25 MED ORDER — AMLODIPINE BESYLATE 10 MG PO TABS: ORAL_TABLET | ORAL | 3 refills | Status: DC

## 2020-11-25 MED ORDER — PANTOPRAZOLE SODIUM 40 MG PO TBEC: DELAYED_RELEASE_TABLET | ORAL | 3 refills | Status: DC

## 2020-11-25 NOTE — Progress Notes (Signed)
Subjective:    Patient ID: Megan [name], female    DOB: 1961/07/03, 60 y.o.   MRN: [id number]  Patient is a very pleasant 60 year old African-American female.  She is on Tuesday Thursday Saturday dialysis for end-stage renal failure.  She is also battling breast cancer and recently had bilateral mastectomies performed at the end of last year.  She reports drops in her blood pressure after dialysis.  She is taking 20 mg a day of amlodipine.  Her blood pressure today is excellent at 010 systolic.  She also has a history of diabetes.  She is long overdue for an A1c.  There is some discordance between her medicine list and her report.  She is not sure if she is taking glipizide.  She is not checking her blood sugar but at times she feels lightheaded and dizzy.  I believe that is a drop in her blood pressure after dialysis but she could be experiencing hypoglycemia.  She also complains of bilateral feet pain at night.  Diabetic foot exam was performed today and is significant for a drop in her transverse arch but she has normal sensation to 10 g monofilament bilaterally and normal pulses in both feet.  The pain sounds more similar to metatarsalgia.  She has not seen any significant benefit from gabapentin or Cymbalta Past Medical History:  Diagnosis Date  . (HFpEF) heart failure with preserved ejection fraction ([place])    a. 01/2019 Echo: EF 55-60%, mild conc LVH. DD.  Torn MV chordae.  . Anemia   . Atypical chest pain    a. 08/2018 MV: EF 59%, no ischemia; b. 02/2019 Cath: nonobs dzs.  . Blood transfusion without reported diagnosis   . Breast cancer ([place]) 10/12/2020  . Cataract   . ESRD (end stage renal disease) on dialysis ([place])    a. HD T, T, S  . Essential hypertension, benign   . GERD (gastroesophageal reflux disease)   . Headache   . Hemorrhoids   . Mixed hyperlipidemia   . Morbid obesity ([place])   . Non-obstructive CAD (coronary artery disease)    a. 02/2019 CathL LM nl, LAD 51m, LCX nl,  RCA 25p, EF 55-65%.  Marland Kitchen PONV (postoperative nausea and vomiting)   . S/P colonoscopy Jan 2011   Dr. Benson Norway: sessile polyp (benign lymphoid), large hemorrhoids, repeat 5-10 years  . Temporal arteritis ([place])   . Type 2 diabetes mellitus ([place])   . Wears glasses    Past Surgical History:  Procedure Laterality Date  . ABDOMINAL HYSTERECTOMY    . APPENDECTOMY    . ARTERY BIOPSY N/A 05/09/2018   Procedure: RIGHT TEMPORAL ARTERY BIOPSY;  Surgeon: Judeth Horn, MD;  Location: [facility];  Service: General;  Laterality: N/A;  . BREAST BIOPSY Right 06/15/2020   Procedure: RIGHT BREAST BIOPSY;  Surgeon: Aviva Signs, MD;  [facility];  Service: General;  Laterality: Right;  . CATARACT EXTRACTION W/PHACO Left 02/09/2017   Procedure: CATARACT EXTRACTION PHACO AND INTRAOCULAR LENS PLACEMENT LEFT EYE;  Surgeon: Tonny Branch, MD;  [facility];  Service: Ophthalmology;  Laterality: Left;  CDE: 4.89  . CATARACT EXTRACTION W/PHACO Right 06/04/2017   Procedure: CATARACT EXTRACTION PHACO AND INTRAOCULAR LENS PLACEMENT (IOC);  Surgeon: Tonny Branch, MD;  [facility];  Service: Ophthalmology;  Laterality: Right;  CDE: 4.12  . CHOLECYSTECTOMY  09/29/2011   Procedure: LAPAROSCOPIC CHOLECYSTECTOMY;  Surgeon: Jamesetta So, MD;  [facility];  Service: General;  Laterality: N/A;  . COLONOSCOPY  Jan 2011   Dr. Benson Norway: sessile polyp (benign lymphoid), large hemorrhoids, repeat 5-10 years  . COLONOSCOPY N/A 06/12/2016   Procedure: COLONOSCOPY;  Surgeon: Daneil Dolin, MD;  [facility];  Service: Endoscopy;  Laterality: N/A;  1230   . ESOPHAGOGASTRODUODENOSCOPY  09/05/2011   [id number] hiatal hernia; remainder of exam normal. No explanation for patient's abdominal pain with today's examination  . ESOPHAGOGASTRODUODENOSCOPY N/A 12/17/2013   Dr. [name]: gastric erythema, erosion, mild chronic inflammation on path   . EXCISION OF BREAST BIOPSY Right 10/12/2020   Procedure: EXCISION OF RIGHT BREAST BIOPSY;   Surgeon: Aviva Signs, MD;  [facility];  Service: General;  Laterality: Right;  . LAPAROSCOPIC APPENDECTOMY  09/29/2011   Procedure: APPENDECTOMY LAPAROSCOPIC;  Surgeon: Jamesetta So, MD;  [facility];  Service: General;;  incidental appendectomy  . LEFT HEART CATH AND CORONARY ANGIOGRAPHY N/A 02/28/2019   Procedure: LEFT HEART CATH AND CORONARY ANGIOGRAPHY;  Surgeon: Jettie Booze, MD;  Location: [facility];  Service: Cardiovascular;  Laterality: N/A;  . MASTECTOMY MODIFIED RADICAL Right 02/18/2020   Procedure: MASTECTOMY MODIFIED RADICAL;  Surgeon: Aviva Signs, MD;  [facility];  Service: General;  Laterality: Right;  . MASTECTOMY, PARTIAL Right 07/13/2020   Procedure: RIGHT PARTIAL MASTECTOMY;  Surgeon: Aviva Signs, MD;  [facility];  Service: General;  Laterality: Right;  . PARTIAL MASTECTOMY WITH NEEDLE LOCALIZATION AND AXILLARY SENTINEL LYMPH NODE BX Right 09/18/2018   Procedure: RIGHT PARTIAL MASTECTOMY AFTER NEEDLE LOCALIZATION, SENTINEL LYMPH NODE BIOPSY RIGHT AXILLA;  Surgeon: Aviva Signs, MD;  [facility];  Service: General;  Laterality: Right;  . SIMPLE MASTECTOMY WITH AXILLARY SENTINEL NODE BIOPSY Left 06/15/2020   Procedure: LEFT SIMPLE MASTECTOMY;  Surgeon: Aviva Signs, MD;  [facility];  Service: General;  Laterality: Left;   Current Outpatient Medications on File Prior to Visit  Medication Sig Dispense Refill  . anastrozole (ARIMIDEX) 1 MG tablet TAKE 1 TABLET(1 MG) BY MOUTH DAILY 30 tablet 4  . aspirin EC 81 MG tablet Take 81 mg by mouth daily.    Marland Kitchen atorvastatin (LIPITOR) 20 MG tablet Take 1 tablet (20 mg total) by mouth daily. (Patient taking differently: Take 20 mg by mouth at bedtime.) 90 tablet 3  . B Complex-C-Folic Acid (DIALYVITE 295) 0.8 MG TABS Take 1 tablet by mouth daily.    . cinacalcet (SENSIPAR) 30 MG tablet Take 2 tablets (60 mg total) by mouth daily. (Patient taking differently: Take 30 mg by mouth daily with  supper.) 90 tablet 0  . Darbepoetin Alfa (ARANESP, ALBUMIN FREE, IJ) Darbepoetin Alfa (Aranesp)    . DULoxetine (CYMBALTA) 30 MG capsule TAKE 1 CAPSULE(30 MG) BY MOUTH DAILY (Patient taking differently: Take 30 mg by mouth daily.) 90 capsule 1  . gabapentin (NEURONTIN) 300 MG capsule TAKE 1 CAPSULE(300 MG) BY MOUTH AT BEDTIME (Patient taking differently: Take 300 mg by mouth at bedtime.) 90 capsule 1  . HYDROcodone-acetaminophen (NORCO) 5-325 MG tablet Take 1 tablet by mouth every 6 (six) hours as needed for moderate pain. 20 tablet 0  . lanthanum (FOSRENOL) 1000 MG chewable tablet Chew 2,000-3,000 mg by mouth See admin instructions. Take 3 tablets (3000 mg) by mouth with meals and take 2 tablets (2000 mg) with snacks    . lidocaine-prilocaine (EMLA) cream Apply 1 application topically every Monday, Wednesday, and Friday with hemodialysis.     Marland Kitchen losartan (COZAAR) 100 MG tablet Take 1 tablet by mouth daily.    Marland Kitchen  nebivolol (BYSTOLIC) 10 MG tablet Take 1 tablet (10 mg total) by mouth in the morning and at bedtime. 180 tablet 3  . glipiZIDE (GLUCOTROL XL) 5 MG 24 hr tablet Take 1 tablet by mouth daily.    . hydrALAZINE (APRESOLINE) 25 MG tablet Take by mouth.     No current facility-administered medications on file prior to visit.   No Known Allergies Social History   Socioeconomic History  . Marital status: Married    Spouse name: Not on file  . Number of children: Not on file  . Years of education: Not on file  . Highest education level: Not on file  Occupational History  . 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[id number]  Tobacco Use  . Smoking status: Never Smoker  . Smokeless tobacco: Never Used  Vaping Use  . Vaping Use: Never used  Substance and Sexual Activity  . Alcohol use: No  . Drug use: No  . Sexual activity: Yes    Birth control/protection: Surgical  Other Topics Concern  . Not on file  Social History Narrative   Works at [employer] in [place].    When trucks come,  she has to put items in their places.   Also has to get items from high shelves-causes achy pain in shoulder area      Married.   Children are grown, out of house.   Social Determinants of Health   Financial Resource Strain: Not on file  Food Insecurity: Not on file  Transportation Needs: Not on file  Physical Activity: Not on file  Stress: Not on file  Social Connections: Not on file  Intimate Partner Violence: Not At Risk  . Fear of Current or Ex-Partner: No  . Emotionally Abused: No  . Physically Abused: No  . Sexually Abused: No    Past Medical History:  Diagnosis Date  . (HFpEF) heart failure with preserved ejection fraction ([place])    a. 01/2019 Echo: EF 55-60%, mild conc LVH. DD.  Torn MV chordae.  . Anemia   . Atypical chest pain    a. 08/2018 MV: EF 59%, no ischemia; b. 02/2019 Cath: nonobs dzs.  . Blood transfusion without reported diagnosis   . Breast cancer ([place]) 10/12/2020  . Cataract   . ESRD (end stage renal disease) on dialysis ([place])    a. HD T, T, S  . Essential hypertension, benign   . GERD (gastroesophageal reflux disease)   . Headache   . Hemorrhoids   . Mixed hyperlipidemia   . Morbid obesity ([place])   . Non-obstructive CAD (coronary artery disease)    a. 02/2019 CathL LM nl, LAD 10m, LCX nl, RCA 25p, EF 55-65%.  Marland Kitchen PONV (postoperative nausea and vomiting)   . S/P colonoscopy Jan 2011   Dr. Benson Norway: sessile polyp (benign lymphoid), large hemorrhoids, repeat 5-10 years  . Temporal arteritis ([place])   . Type 2 diabetes mellitus ([place])   . Wears glasses    Past Surgical History:  Procedure Laterality Date  . ABDOMINAL HYSTERECTOMY    . APPENDECTOMY    . ARTERY BIOPSY N/A 05/09/2018   Procedure: RIGHT TEMPORAL ARTERY BIOPSY;  Surgeon: Judeth Horn, MD;  Location: [facility];  Service: General;  Laterality: N/A;  . BREAST BIOPSY Right 06/15/2020   Procedure: RIGHT BREAST BIOPSY;  Surgeon: Aviva Signs, MD;  [facility];  Service: General;  Laterality: Right;  .  CATARACT EXTRACTION W/PHACO Left 02/09/2017   Procedure: CATARACT EXTRACTION PHACO  AND INTRAOCULAR LENS PLACEMENT LEFT EYE;  Surgeon: Tonny Branch, MD;  [facility];  Service: Ophthalmology;  Laterality: Left;  CDE: 4.89  . CATARACT EXTRACTION W/PHACO Right 06/04/2017   Procedure: CATARACT EXTRACTION PHACO AND INTRAOCULAR LENS PLACEMENT (IOC);  Surgeon: Tonny Branch, MD;  [facility];  Service: Ophthalmology;  Laterality: Right;  CDE: 4.12  . CHOLECYSTECTOMY  09/29/2011   Procedure: LAPAROSCOPIC CHOLECYSTECTOMY;  Surgeon: Jamesetta So, MD;  [facility];  Service: General;  Laterality: N/A;  . COLONOSCOPY  Jan 2011   Dr. Benson Norway: sessile polyp (benign lymphoid), large hemorrhoids, repeat 5-10 years  . COLONOSCOPY N/A 06/12/2016   Procedure: COLONOSCOPY;  Surgeon: Daneil Dolin, MD;  [facility];  Service: Endoscopy;  Laterality: N/A;  1230   . ESOPHAGOGASTRODUODENOSCOPY  09/05/2011   JHE:[id number] hiatal hernia; remainder of exam normal. No explanation for patient's abdominal pain with today's examination  . ESOPHAGOGASTRODUODENOSCOPY N/A 12/17/2013   Dr. [name]: gastric erythema, erosion, mild chronic inflammation on path   . EXCISION OF BREAST BIOPSY Right 10/12/2020   Procedure: EXCISION OF RIGHT BREAST BIOPSY;  Surgeon: Aviva Signs, MD;  [facility];  Service: General;  Laterality: Right;  . LAPAROSCOPIC APPENDECTOMY  09/29/2011   Procedure: APPENDECTOMY LAPAROSCOPIC;  Surgeon: Jamesetta So, MD;  [facility];  Service: General;;  incidental appendectomy  . LEFT HEART CATH AND CORONARY ANGIOGRAPHY N/A 02/28/2019   Procedure: LEFT HEART CATH AND CORONARY ANGIOGRAPHY;  Surgeon: Jettie Booze, MD;  Location: [facility];  Service: Cardiovascular;  Laterality: N/A;  . MASTECTOMY MODIFIED RADICAL Right 02/18/2020   Procedure: MASTECTOMY MODIFIED RADICAL;  Surgeon: Aviva Signs, MD;  [facility];  Service: General;  Laterality: Right;  . MASTECTOMY,  PARTIAL Right 07/13/2020   Procedure: RIGHT PARTIAL MASTECTOMY;  Surgeon: Aviva Signs, MD;  [facility];  Service: General;  Laterality: Right;  . PARTIAL MASTECTOMY WITH NEEDLE LOCALIZATION AND AXILLARY SENTINEL LYMPH NODE BX Right 09/18/2018   Procedure: RIGHT PARTIAL MASTECTOMY AFTER NEEDLE LOCALIZATION, SENTINEL LYMPH NODE BIOPSY RIGHT AXILLA;  Surgeon: Aviva Signs, MD;  [facility];  Service: General;  Laterality: Right;  . SIMPLE MASTECTOMY WITH AXILLARY SENTINEL NODE BIOPSY Left 06/15/2020   Procedure: LEFT SIMPLE MASTECTOMY;  Surgeon: Aviva Signs, MD;  [facility];  Service: General;  Laterality: Left;   Current Outpatient Medications on File Prior to Visit  Medication Sig Dispense Refill  . anastrozole (ARIMIDEX) 1 MG tablet TAKE 1 TABLET(1 MG) BY MOUTH DAILY 30 tablet 4  . aspirin EC 81 MG tablet Take 81 mg by mouth daily.    Marland Kitchen atorvastatin (LIPITOR) 20 MG tablet Take 1 tablet (20 mg total) by mouth daily. (Patient taking differently: Take 20 mg by mouth at bedtime.) 90 tablet 3  . B Complex-C-Folic Acid (DIALYVITE 144) 0.8 MG TABS Take 1 tablet by mouth daily.    . cinacalcet (SENSIPAR) 30 MG tablet Take 2 tablets (60 mg total) by mouth daily. (Patient taking differently: Take 30 mg by mouth daily with supper.) 90 tablet 0  . Darbepoetin Alfa (ARANESP, ALBUMIN FREE, IJ) Darbepoetin Alfa (Aranesp)    . DULoxetine (CYMBALTA) 30 MG capsule TAKE 1 CAPSULE(30 MG) BY MOUTH DAILY (Patient taking differently: Take 30 mg by mouth daily.) 90 capsule 1  . gabapentin (NEURONTIN) 300 MG capsule TAKE 1 CAPSULE(300 MG) BY MOUTH AT BEDTIME (Patient taking differently: Take 300 mg by mouth at bedtime.) 90 capsule 1  . HYDROcodone-acetaminophen (NORCO) 5-325 MG  tablet Take 1 tablet by mouth every 6 (six) hours as needed for moderate pain. 20 tablet 0  . lanthanum (FOSRENOL) 1000 MG chewable tablet Chew 2,000-3,000 mg by mouth See admin instructions. Take 3 tablets (3000 mg) by mouth with  meals and take 2 tablets (2000 mg) with snacks    . lidocaine-prilocaine (EMLA) cream Apply 1 application topically every Monday, Wednesday, and Friday with hemodialysis.     Marland Kitchen losartan (COZAAR) 100 MG tablet Take 1 tablet by mouth daily.    . nebivolol (BYSTOLIC) 10 MG tablet Take 1 tablet (10 mg total) by mouth in the morning and at bedtime. 180 tablet 3  . glipiZIDE (GLUCOTROL XL) 5 MG 24 hr tablet Take 1 tablet by mouth daily.    . hydrALAZINE (APRESOLINE) 25 MG tablet Take by mouth.     No current facility-administered medications on file prior to visit.   No Known Allergies Social History   Socioeconomic History  . Marital status: Married    Spouse name: Not on file  . Number of children: Not on file  . Years of education: Not on file  . Highest education level: Not on file  Occupational History  . 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[id number]  Tobacco Use  . Smoking status: Never Smoker  . Smokeless tobacco: Never Used  Vaping Use  . Vaping Use: Never used  Substance and Sexual Activity  . Alcohol use: No  . Drug use: No  . Sexual activity: Yes    Birth control/protection: Surgical  Other Topics Concern  . Not on file  Social History Narrative   Works at [employer] in [place].    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   Also has to get items from high shelves-causes achy pain in shoulder area      Married.   Children are grown, out of house.   Social Determinants of Health   Financial Resource Strain: Not on file  Food Insecurity: Not on file  Transportation Needs: Not on file  Physical Activity: Not on file  Stress: Not on file  Social Connections: Not on file  Intimate Partner Violence: Not At Risk  . Fear of Current or Ex-Partner: No  . Emotionally Abused: No  . Physically Abused: No  . Sexually Abused: No      Review of Systems  Respiratory: Positive for cough.   All other systems reviewed and are negative.      Objective:    Physical Exam Vitals reviewed.  Constitutional:      General: She is not in acute distress.    Appearance: Normal appearance. She is normal weight.  Cardiovascular:     Rate and Rhythm: Normal rate and regular rhythm.     Pulses: Normal pulses.     Heart sounds: Murmur (thrill from AV fistual heard as murmur) heard.    Pulmonary:     Effort: Pulmonary effort is normal. No respiratory distress.     Breath sounds: Normal breath sounds. No stridor. No wheezing, rhonchi or rales.  Chest:     Chest wall: No tenderness.  Musculoskeletal:     Right lower leg: No edema.     Left lower leg: No edema.  Neurological:     Mental Status: She is alert.           Assessment & Plan:  Type 2 diabetes mellitus with chronic kidney disease, without long-term current use of insulin,  unspecified CKD stage ([place]) - Plan: Hemoglobin A1c, CBC with Differential/Platelet, COMPLETE METABOLIC PANEL WITH GFR, Lipid panel  I believe more of her foot pain may be metatarsalgia.  I recommended stopping gabapentin and see if she notices any benefit.  Also recommended seeing a podiatrist for possible insoles due to a collapsed transverse arch and metatarsalgia.  Blood pressure is low so have asked her to reduce amlodipine to 10 mg a days only on days that she does not go to dialysis.  She will likely need to hold the amlodipine on dialysis days.  I will check an A1c.  Have asked for the patient to verify if she is actually on glipizide.  If her A1c is excellent and she is still taking glipizide we will discontinue it.

## 2020-11-26 DIAGNOSIS — N2581 Secondary hyperparathyroidism of renal origin: Secondary | ICD-10-CM | POA: Diagnosis not present

## 2020-11-26 DIAGNOSIS — E119 Type 2 diabetes mellitus without complications: Secondary | ICD-10-CM | POA: Diagnosis not present

## 2020-11-26 DIAGNOSIS — D509 Iron deficiency anemia, unspecified: Secondary | ICD-10-CM | POA: Diagnosis not present

## 2020-11-26 DIAGNOSIS — N186 End stage renal disease: Secondary | ICD-10-CM | POA: Diagnosis not present

## 2020-11-26 DIAGNOSIS — E1129 Type 2 diabetes mellitus with other diabetic kidney complication: Secondary | ICD-10-CM | POA: Diagnosis not present

## 2020-11-26 DIAGNOSIS — Z992 Dependence on renal dialysis: Secondary | ICD-10-CM | POA: Diagnosis not present

## 2020-11-26 LAB — COMPLETE METABOLIC PANEL WITH GFR
AG Ratio: 1.2 (calc) (ref 1.0–2.5)
ALT: 21 U/L (ref 6–29)
AST: 17 U/L (ref 10–35)
Albumin: 4.1 g/dL (ref 3.6–5.1)
Alkaline phosphatase (APISO): 117 U/L (ref 37–153)
BUN/Creatinine Ratio: 6 (calc) (ref 6–22)
BUN: 49 mg/dL — ABNORMAL HIGH (ref 7–25)
CO2: 27 mmol/L (ref 20–32)
Calcium: 9.6 mg/dL (ref 8.6–10.4)
Chloride: 101 mmol/L (ref 98–110)
Creat: 8.76 mg/dL — ABNORMAL HIGH (ref 0.50–1.05)
GFR, Est African American: 5 mL/min/{1.73_m2} — ABNORMAL LOW (ref >=60)
GFR, Est Non African American: 5 mL/min/{1.73_m2} — ABNORMAL LOW (ref >=60)
Globulin: 3.3 g/dL (calc) (ref 1.9–3.7)
Glucose, Bld: 113 mg/dL — ABNORMAL HIGH (ref 65–99)
Potassium: 4.5 mmol/L (ref 3.5–5.3)
Sodium: 143 mmol/L (ref 135–146)
Total Bilirubin: 0.5 mg/dL (ref 0.2–1.2)
Total Protein: 7.4 g/dL (ref 6.1–8.1)

## 2020-11-26 LAB — CBC WITH DIFFERENTIAL/PLATELET
Absolute Monocytes: 704 cells/uL (ref 200–950)
Basophils Absolute: 96 cells/uL (ref 0–200)
Basophils Relative: 1.2 %
Eosinophils Absolute: 152 cells/uL (ref 15–500)
Eosinophils Relative: 1.9 %
HCT: 37.3 % (ref 35.0–45.0)
Hemoglobin: 12.2 g/dL (ref 11.7–15.5)
Lymphs Abs: 2016 cells/uL (ref 850–3900)
MCH: 30.8 pg (ref 27.0–33.0)
MCHC: 32.7 g/dL (ref 32.0–36.0)
MCV: 94.2 fL (ref 80.0–100.0)
MPV: 10.9 fL (ref 7.5–12.5)
Monocytes Relative: 8.8 %
Neutro Abs: 5032 cells/uL (ref 1500–7800)
Neutrophils Relative %: 62.9 %
Platelets: 263 10*3/uL (ref 140–400)
RBC: 3.96 10*6/uL (ref 3.80–5.10)
RDW: 18.7 % — ABNORMAL HIGH (ref 11.0–15.0)
Total Lymphocyte: 25.2 %
WBC: 8 10*3/uL (ref 3.8–10.8)

## 2020-11-26 LAB — LIPID PANEL
Cholesterol: 141 mg/dL (ref <200)
HDL: 55 mg/dL (ref >=50)
LDL Cholesterol (Calc): 72 mg/dL (calc)
Non-HDL Cholesterol (Calc): 86 mg/dL (calc) (ref <130)
Total CHOL/HDL Ratio: 2.6 (calc) (ref <5.0)
Triglycerides: 67 mg/dL (ref <150)

## 2020-11-26 LAB — HEMOGLOBIN A1C
Hgb A1c MFr Bld: 5.5 % of total Hgb (ref <5.7)
Mean Plasma Glucose: 111 mg/dL
eAG (mmol/L): 6.2 mmol/L

## 2020-11-29 ENCOUNTER — Ambulatory Visit: Payer: Medicare Other | Admitting: Family Medicine

## 2020-11-29 DIAGNOSIS — E1129 Type 2 diabetes mellitus with other diabetic kidney complication: Secondary | ICD-10-CM | POA: Diagnosis not present

## 2020-11-29 DIAGNOSIS — Z992 Dependence on renal dialysis: Secondary | ICD-10-CM | POA: Diagnosis not present

## 2020-11-29 DIAGNOSIS — N2581 Secondary hyperparathyroidism of renal origin: Secondary | ICD-10-CM | POA: Diagnosis not present

## 2020-11-29 DIAGNOSIS — E119 Type 2 diabetes mellitus without complications: Secondary | ICD-10-CM | POA: Diagnosis not present

## 2020-11-29 DIAGNOSIS — D509 Iron deficiency anemia, unspecified: Secondary | ICD-10-CM | POA: Diagnosis not present

## 2020-11-29 DIAGNOSIS — N186 End stage renal disease: Secondary | ICD-10-CM | POA: Diagnosis not present

## 2020-11-30 ENCOUNTER — Other Ambulatory Visit: Payer: Self-pay | Admitting: *Deleted

## 2020-11-30 DIAGNOSIS — C50811 Malignant neoplasm of overlapping sites of right female breast: Secondary | ICD-10-CM | POA: Diagnosis not present

## 2020-11-30 DIAGNOSIS — C50211 Malignant neoplasm of upper-inner quadrant of right female breast: Secondary | ICD-10-CM | POA: Diagnosis not present

## 2020-12-01 DIAGNOSIS — N2581 Secondary hyperparathyroidism of renal origin: Secondary | ICD-10-CM | POA: Diagnosis not present

## 2020-12-01 DIAGNOSIS — E1129 Type 2 diabetes mellitus with other diabetic kidney complication: Secondary | ICD-10-CM | POA: Diagnosis not present

## 2020-12-01 DIAGNOSIS — N186 End stage renal disease: Secondary | ICD-10-CM | POA: Diagnosis not present

## 2020-12-01 DIAGNOSIS — Z992 Dependence on renal dialysis: Secondary | ICD-10-CM | POA: Diagnosis not present

## 2020-12-01 DIAGNOSIS — E119 Type 2 diabetes mellitus without complications: Secondary | ICD-10-CM | POA: Diagnosis not present

## 2020-12-01 DIAGNOSIS — D509 Iron deficiency anemia, unspecified: Secondary | ICD-10-CM | POA: Diagnosis not present

## 2020-12-01 DIAGNOSIS — E039 Hypothyroidism, unspecified: Secondary | ICD-10-CM | POA: Diagnosis not present

## 2020-12-03 DIAGNOSIS — E1129 Type 2 diabetes mellitus with other diabetic kidney complication: Secondary | ICD-10-CM | POA: Diagnosis not present

## 2020-12-03 DIAGNOSIS — N2581 Secondary hyperparathyroidism of renal origin: Secondary | ICD-10-CM | POA: Diagnosis not present

## 2020-12-03 DIAGNOSIS — Z992 Dependence on renal dialysis: Secondary | ICD-10-CM | POA: Diagnosis not present

## 2020-12-03 DIAGNOSIS — D509 Iron deficiency anemia, unspecified: Secondary | ICD-10-CM | POA: Diagnosis not present

## 2020-12-03 DIAGNOSIS — E119 Type 2 diabetes mellitus without complications: Secondary | ICD-10-CM | POA: Diagnosis not present

## 2020-12-03 DIAGNOSIS — N186 End stage renal disease: Secondary | ICD-10-CM | POA: Diagnosis not present

## 2020-12-06 DIAGNOSIS — D509 Iron deficiency anemia, unspecified: Secondary | ICD-10-CM | POA: Diagnosis not present

## 2020-12-06 DIAGNOSIS — N186 End stage renal disease: Secondary | ICD-10-CM | POA: Diagnosis not present

## 2020-12-06 DIAGNOSIS — E1129 Type 2 diabetes mellitus with other diabetic kidney complication: Secondary | ICD-10-CM | POA: Diagnosis not present

## 2020-12-06 DIAGNOSIS — N2581 Secondary hyperparathyroidism of renal origin: Secondary | ICD-10-CM | POA: Diagnosis not present

## 2020-12-06 DIAGNOSIS — Z992 Dependence on renal dialysis: Secondary | ICD-10-CM | POA: Diagnosis not present

## 2020-12-06 DIAGNOSIS — E119 Type 2 diabetes mellitus without complications: Secondary | ICD-10-CM | POA: Diagnosis not present

## 2020-12-07 DIAGNOSIS — C50211 Malignant neoplasm of upper-inner quadrant of right female breast: Secondary | ICD-10-CM | POA: Diagnosis not present

## 2020-12-07 DIAGNOSIS — C50811 Malignant neoplasm of overlapping sites of right female breast: Secondary | ICD-10-CM | POA: Diagnosis not present

## 2020-12-08 DIAGNOSIS — Z992 Dependence on renal dialysis: Secondary | ICD-10-CM | POA: Diagnosis not present

## 2020-12-08 DIAGNOSIS — N186 End stage renal disease: Secondary | ICD-10-CM | POA: Diagnosis not present

## 2020-12-08 DIAGNOSIS — E1129 Type 2 diabetes mellitus with other diabetic kidney complication: Secondary | ICD-10-CM | POA: Diagnosis not present

## 2020-12-08 DIAGNOSIS — E119 Type 2 diabetes mellitus without complications: Secondary | ICD-10-CM | POA: Diagnosis not present

## 2020-12-08 DIAGNOSIS — D509 Iron deficiency anemia, unspecified: Secondary | ICD-10-CM | POA: Diagnosis not present

## 2020-12-08 DIAGNOSIS — N2581 Secondary hyperparathyroidism of renal origin: Secondary | ICD-10-CM | POA: Diagnosis not present

## 2020-12-10 DIAGNOSIS — C50211 Malignant neoplasm of upper-inner quadrant of right female breast: Secondary | ICD-10-CM | POA: Diagnosis not present

## 2020-12-10 DIAGNOSIS — N2581 Secondary hyperparathyroidism of renal origin: Secondary | ICD-10-CM | POA: Diagnosis not present

## 2020-12-10 DIAGNOSIS — E1129 Type 2 diabetes mellitus with other diabetic kidney complication: Secondary | ICD-10-CM | POA: Diagnosis not present

## 2020-12-10 DIAGNOSIS — E119 Type 2 diabetes mellitus without complications: Secondary | ICD-10-CM | POA: Diagnosis not present

## 2020-12-10 DIAGNOSIS — D509 Iron deficiency anemia, unspecified: Secondary | ICD-10-CM | POA: Diagnosis not present

## 2020-12-10 DIAGNOSIS — C50811 Malignant neoplasm of overlapping sites of right female breast: Secondary | ICD-10-CM | POA: Diagnosis not present

## 2020-12-10 DIAGNOSIS — Z992 Dependence on renal dialysis: Secondary | ICD-10-CM | POA: Diagnosis not present

## 2020-12-10 DIAGNOSIS — N186 End stage renal disease: Secondary | ICD-10-CM | POA: Diagnosis not present

## 2020-12-11 ENCOUNTER — Other Ambulatory Visit: Payer: Self-pay | Admitting: Family Medicine

## 2020-12-12 DIAGNOSIS — Z992 Dependence on renal dialysis: Secondary | ICD-10-CM | POA: Diagnosis not present

## 2020-12-12 DIAGNOSIS — N040 Nephrotic syndrome with minor glomerular abnormality: Secondary | ICD-10-CM | POA: Diagnosis not present

## 2020-12-12 DIAGNOSIS — N186 End stage renal disease: Secondary | ICD-10-CM | POA: Diagnosis not present

## 2020-12-13 DIAGNOSIS — C50211 Malignant neoplasm of upper-inner quadrant of right female breast: Secondary | ICD-10-CM | POA: Diagnosis not present

## 2020-12-14 ENCOUNTER — Encounter (HOSPITAL_COMMUNITY): Payer: Self-pay

## 2020-12-14 DIAGNOSIS — C50211 Malignant neoplasm of upper-inner quadrant of right female breast: Secondary | ICD-10-CM | POA: Diagnosis not present

## 2020-12-14 DIAGNOSIS — D631 Anemia in chronic kidney disease: Secondary | ICD-10-CM | POA: Diagnosis not present

## 2020-12-14 DIAGNOSIS — E119 Type 2 diabetes mellitus without complications: Secondary | ICD-10-CM | POA: Diagnosis not present

## 2020-12-14 DIAGNOSIS — E1129 Type 2 diabetes mellitus with other diabetic kidney complication: Secondary | ICD-10-CM | POA: Diagnosis not present

## 2020-12-14 DIAGNOSIS — Z992 Dependence on renal dialysis: Secondary | ICD-10-CM | POA: Diagnosis not present

## 2020-12-14 DIAGNOSIS — N2581 Secondary hyperparathyroidism of renal origin: Secondary | ICD-10-CM | POA: Diagnosis not present

## 2020-12-14 DIAGNOSIS — N186 End stage renal disease: Secondary | ICD-10-CM | POA: Diagnosis not present

## 2020-12-14 NOTE — Progress Notes (Signed)
Notification received from Dr. Cam Hai office that patient reports having a headache almost daily. I called the patient back and she asks if it is ok to treat with OTC medications. Ok to treat headache with OTC medications per Dr. Delton Coombes, but I recommend patient defer to her nephrologist's OTC medication preference. Patient verbalized understanding

## 2020-12-15 ENCOUNTER — Encounter: Payer: Self-pay | Admitting: Nurse Practitioner

## 2020-12-15 ENCOUNTER — Other Ambulatory Visit: Payer: Self-pay

## 2020-12-15 ENCOUNTER — Telehealth (INDEPENDENT_AMBULATORY_CARE_PROVIDER_SITE_OTHER): Payer: Medicare Other | Admitting: Nurse Practitioner

## 2020-12-15 VITALS — BP 119/68 | HR 91

## 2020-12-15 DIAGNOSIS — C50211 Malignant neoplasm of upper-inner quadrant of right female breast: Secondary | ICD-10-CM | POA: Diagnosis not present

## 2020-12-15 DIAGNOSIS — A084 Viral intestinal infection, unspecified: Secondary | ICD-10-CM

## 2020-12-15 NOTE — Patient Instructions (Signed)

## 2020-12-15 NOTE — Progress Notes (Signed)
Subjective:    Patient ID: Megan [name], female    DOB: [birth year], 60 y.o.   MRN: [id number]  HPI: Megan [name] is a 60 y.o. female presenting for diarrhea.  Chief Complaint  Patient presents with  . Illness    Low grade fever, no appetite, loss of taste, chills. Preventing her from getting dialysis, has missed 2 due to diarrhea, takes radiation for breast cancer daily. Taking imodium, not working.   DIARRHEA Duration: days Diarrhea: yes non-bloody  Episodes of diarrhea/day: 8-9 times per day Description of diarrhea: liquid - watery, no hard chunks, brown/green & grainy Urinary changes: no  Nausea: no Vomiting: yes; Monday night but not since Episodes of vomit/day: 1 Description of vomiting: while having BM Abdominal pain: yes; cramping  Fever: yes; 99 low grade fever at end  Chills: yes Decreased appetite: yes Loss of taste or smell: loss of taste Tolerating liquids: yes; has been drinking Mountain Dew, Coke, water, ice chips Foreign travel: no Relevant dietary history: Saturday ate at [employer] in [place], [place].   Similar illness in contacts: no Recent antibiotic use: no Status: stable Treatments attempted: Immodium  Of note, patient reports she had to reschedule her dialysis because of how frequently she was having bowel movements.  Is currently getting radiation to breast - started Monday.   No Known Allergies  Outpatient Encounter Medications as of 12/15/2020  Medication Sig  . amLODipine (NORVASC) 10 MG tablet TAKE 1 TABLET BY MOUTH AT BEDTIME  . anastrozole (ARIMIDEX) 1 MG tablet TAKE 1 TABLET(1 MG) BY MOUTH DAILY  . aspirin EC 81 MG tablet Take 81 mg by mouth daily.  Marland Kitchen atorvastatin (LIPITOR) 20 MG tablet Take 1 tablet (20 mg total) by mouth daily. (Patient taking differently: Take 20 mg by mouth at bedtime.)  . B Complex-C-Folic Acid (DIALYVITE 409) 0.8 MG TABS Take 1 tablet by mouth daily.  . cinacalcet (SENSIPAR) 30 MG tablet Take 2 tablets  (60 mg total) by mouth daily. (Patient taking differently: Take 30 mg by mouth daily with supper.)  . Darbepoetin Alfa (ARANESP, ALBUMIN FREE, IJ) Darbepoetin Alfa (Aranesp)  . DULoxetine (CYMBALTA) 30 MG capsule TAKE 1 CAPSULE(30 MG) BY MOUTH DAILY (Patient taking differently: Take 30 mg by mouth daily.)  . gabapentin (NEURONTIN) 300 MG capsule TAKE 1 CAPSULE(300 MG) BY MOUTH AT BEDTIME  . hydrALAZINE (APRESOLINE) 25 MG tablet Take by mouth.  Marland Kitchen HYDROcodone-acetaminophen (NORCO) 5-325 MG tablet Take 1 tablet by mouth every 6 (six) hours as needed for moderate pain.  Marland Kitchen lanthanum (FOSRENOL) 1000 MG chewable tablet Chew 2,000-3,000 mg by mouth See admin instructions. Take 3 tablets (3000 mg) by mouth with meals and take 2 tablets (2000 mg) with snacks  . lidocaine-prilocaine (EMLA) cream Apply 1 application topically every Monday, Wednesday, and Friday with hemodialysis.   Marland Kitchen losartan (COZAAR) 100 MG tablet Take 1 tablet by mouth daily.  . nebivolol (BYSTOLIC) 10 MG tablet Take 1 tablet (10 mg total) by mouth in the morning and at bedtime.  . pantoprazole (PROTONIX) 40 MG tablet TAKE 1 TABLET(40 MG) BY MOUTH DAILY   No facility-administered encounter medications on file as of 12/15/2020.    Patient Active Problem List   Diagnosis Date Noted  . Breast mass, right   . History of right breast cancer   . Lobar pneumonia, unspecified organism ([place]) 07/03/2020  . Pleural effusion, right   . S/P left mastectomy 06/15/2020  . Allergy, unspecified, initial encounter 03/03/2020  . Anaphylactic  shock, unspecified, initial encounter 03/03/2020  . Breast cancer ([place]) 02/18/2020  . History of modified radical mastectomy of right breast 02/18/2020  . S/P mastectomy, right 02/18/2020  . ESRD (end stage renal disease) on dialysis ([place])   . Grade III hemorrhoids 07/31/2019  . Fluid overload, unspecified 05/24/2019  . Anaphylactic reaction due to adverse effect of correct drug or medicament properly  administered, initial encounter 04/30/2019  . Unspecified protein-calorie malnutrition ([place]) 03/03/2019  . Hypertensive heart disease with heart failure ([place]) 03/01/2019  . Atherosclerotic heart disease of native coronary artery without angina pectoris 03/01/2019  . Chest pain 02/28/2019  . Chest pain with high risk for cardiac etiology 02/28/2019  . Elevated troponin 02/03/2019  . ESRD (end stage renal disease) ([place]) 02/03/2019  . Acute diastolic CHF (congestive heart failure) ([place]) 02/03/2019  . Hypokalemia 02/03/2019  . Acute respiratory failure with hypoxia ([place]) 02/03/2019  . 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[place])   . Hypocalcemia 09/03/2018  . Hypothyroidism, unspecified 09/21/2017  . Anemia in chronic kidney disease 04/12/2017  . Iron deficiency anemia, unspecified 04/12/2017  . Abnormal results of kidney function studies 04/11/2017  . Acidosis 04/11/2017  . Anemia, unspecified 04/11/2017  . Coagulation defect, unspecified ([place]) 04/11/2017  . 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, with stage 5 chronic kidney disease, or end stage renal disease ([place]) 04/11/2017  . 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 Other disorders of phosphorus metabolism 04/11/2017  . Pain, unspecified 04/11/2017  . Proteinuria, unspecified 04/11/2017  . Pruritus, unspecified 04/11/2017  . Unspecified kidney failure 04/11/2017  . Encounter for prophylactic removal of breast 04/04/2017  . Rectal bleeding 06/08/2016  . Vaginal discharge 12/30/2015  . Abdominal pain, epigastric 11/26/2013  . GERD (gastroesophageal reflux disease)   . Type 2 diabetes mellitus ([place])   . CKD (chronic kidney disease) stage 3, GFR 30-59 ml/min (HCC)   . External hemorrhoid 08/01/2011  . Hematochezia 07/13/2011  . Dyspnea 04/21/2010  . Atypical chest pain 04/21/2010  . Mixed hyperlipidemia 03/11/2008  . OBESITY 03/11/2008  .  Essential hypertension, benign 03/11/2008    Past Medical History:  Diagnosis Date  . (HFpEF) heart failure with preserved ejection fraction ([place])    a. 01/2019 Echo: EF 55-60%, mild conc LVH. DD.  Torn MV chordae.  . Anemia   . Atypical chest pain    a. 08/2018 MV: EF 59%, no ischemia; b. 02/2019 Cath: nonobs dzs.  . Blood transfusion without reported diagnosis   . Breast cancer ([place]) 10/12/2020  . Cataract   . ESRD (end stage renal disease) on dialysis ([place])    a. HD T, T, S  . Essential hypertension, benign   . GERD (gastroesophageal reflux disease)   . Headache   . Hemorrhoids   . Mixed hyperlipidemia   . Morbid obesity ([place])   . Non-obstructive CAD (coronary artery disease)    a. 02/2019 CathL LM nl, LAD 2m, LCX nl, RCA 25p, EF 55-65%.  Marland Kitchen PONV (postoperative nausea and vomiting)   . S/P colonoscopy Jan 2011   Dr. Benson Norway: sessile polyp (benign lymphoid), large hemorrhoids, repeat 5-10 years  . Temporal arteritis ([place])   . Type 2 diabetes mellitus ([place])   . Wears glasses     Relevant past medical, surgical, family and social history reviewed and updated as indicated. Interim medical history since our last visit reviewed.  Review of Systems  Constitutional: Positive for appetite change.  HENT: Positive  for sinus pressure and sinus pain. Negative for congestion, ear pain, postnasal drip, rhinorrhea, sneezing and sore throat.   Respiratory: Negative.  Negative for cough, shortness of breath and wheezing.   Cardiovascular: Negative.  Negative for chest pain.   Per HPI unless specifically indicated above     Objective:    BP 119/68   Pulse 91   Wt Readings from Last 3 Encounters:  11/25/20 167 lb (75.8 kg)  11/10/20 161 lb 12.8 oz (73.4 kg)  11/04/20 167 lb (75.8 kg)    Physical Exam Vitals and nursing note reviewed.  Constitutional:      General: She is not in acute distress.    Appearance: Normal appearance. She is not toxic-appearing.  Skin:    Coloration: Skin is  not jaundiced or pale.     Findings: No erythema.  Neurological:     Mental Status: She is alert and oriented to person, place, and time.  Psychiatric:        Mood and Affect: Mood normal.        Behavior: Behavior normal.        Thought Content: Thought content normal.        Judgment: Judgment normal.    Results for orders placed or performed in visit on 11/25/20  Hemoglobin A1c  Result Value Ref Range   Hgb A1c MFr Bld 5.5 <5.7 % of total Hgb   Mean Plasma Glucose 111 mg/dL   eAG (mmol/L) 6.2 mmol/L  CBC with Differential/Platelet  Result Value Ref Range   WBC 8.0 3.8 - 10.8 Thousand/uL   RBC 3.96 3.80 - 5.10 Million/uL   Hemoglobin 12.2 11.7 - 15.5 g/dL   HCT 37.3 35.0 - 45.0 %   MCV 94.2 80.0 - 100.0 fL   MCH 30.8 27.0 - 33.0 pg   MCHC 32.7 32.0 - 36.0 g/dL   RDW 18.7 (H) 11.0 - 15.0 %   Platelets 263 140 - 400 Thousand/uL   MPV 10.9 7.5 - 12.5 fL   Neutro Abs 5,032 1,500 - 7,800 cells/uL   Lymphs Abs 2,016 850 - 3,900 cells/uL   Absolute Monocytes 704 200 - 950 cells/uL   Eosinophils Absolute 152 15 - 500 cells/uL   Basophils Absolute 96 0 - 200 cells/uL   Neutrophils Relative % 62.9 %   Total Lymphocyte 25.2 %   Monocytes Relative 8.8 %   Eosinophils Relative 1.9 %   Basophils Relative 1.2 %  COMPLETE METABOLIC PANEL WITH GFR  Result Value Ref Range   Glucose, Bld 113 (H) 65 - 99 mg/dL   BUN 49 (H) 7 - 25 mg/dL   Creat 8.76 (H) 0.50 - 1.05 mg/dL   GFR, Est Non African American 5 (L) > OR = 60 mL/min/1.93m2   GFR, Est African American 5 (L) > OR = 60 mL/min/1.33m2   BUN/Creatinine Ratio 6 6 - 22 (calc)   Sodium 143 135 - 146 mmol/L   Potassium 4.5 3.5 - 5.3 mmol/L   Chloride 101 98 - 110 mmol/L   CO2 27 20 - 32 mmol/L   Calcium 9.6 8.6 - 10.4 mg/dL   Total Protein 7.4 6.1 - 8.1 g/dL   Albumin 4.1 3.6 - 5.1 g/dL   Globulin 3.3 1.9 - 3.7 g/dL (calc)   AG Ratio 1.2 1.0 - 2.5 (calc)   Total Bilirubin 0.5 0.2 - 1.2 mg/dL   Alkaline phosphatase (APISO) 117 37  - 153 U/L   AST 17 10 - 35 U/L  ALT 21 6 - 29 U/L  Lipid panel  Result Value Ref Range   Cholesterol 141 <200 mg/dL   HDL 55 > OR = 50 mg/dL   Triglycerides 67 <150 mg/dL   LDL Cholesterol (Calc) 72 mg/dL (calc)   Total CHOL/HDL Ratio 2.6 <5.0 (calc)   Non-HDL Cholesterol (Calc) 86 <130 mg/dL (calc)      Assessment & Plan:  1. Viral gastroenteritis Acute.  Likely viral gastroenteritis given length and description of symptoms.  Encouraged PCR COVID testing even though at home COVID test was negative.  Encouraged hydration with electrolyte solution instead of soda, chills/fever treatment with Tylenol.  Eat foods easy to digest.  If symptoms persist for more than 7 days, return to clinic for further testing.    - SARS-COV-2 RNA,(COVID-19) QUAL NAAT    Follow up plan: Return if symptoms worsen or fail to improve.   Due to the catastrophic nature of the COVID-19 pandemic, this video visit was completed in part via audio and visual contact via [facility] due to the restrictions of the COVID-19 pandemic. video connection was lost at <50% of the duration of the visit, at which time the remainder of the visit was completed via audio only.  All issues as above were discussed and addressed. Physical exam was done as above through visual confirmation on [facility]. If it was felt that the patient should be evaluated in the office, they were directed there. The patient verbally consented to this visit. . Location of the patient: home . Location of the provider: work . Those involved with this call:  . Provider: Noemi Chapel, DNP, FNP-C . CMA: Annabelle Harman, CMA . Front Desk/Registration: Vevelyn Pat  . Time spent on call: 14 minutes on the phone discussing health concerns. 20 minutes total spent in review of patient's record and preparation of their chart.  I verified patient identity using two factors (patient name and date of birth). Patient consents verbally to being seen via  telemedicine visit today.

## 2020-12-16 DIAGNOSIS — C50211 Malignant neoplasm of upper-inner quadrant of right female breast: Secondary | ICD-10-CM | POA: Diagnosis not present

## 2020-12-16 LAB — SARS-COV-2 RNA,(COVID-19) QUALITATIVE NAAT: SARS CoV2 RNA: NOT DETECTED

## 2020-12-17 DIAGNOSIS — N2581 Secondary hyperparathyroidism of renal origin: Secondary | ICD-10-CM | POA: Diagnosis not present

## 2020-12-17 DIAGNOSIS — C50811 Malignant neoplasm of overlapping sites of right female breast: Secondary | ICD-10-CM | POA: Diagnosis not present

## 2020-12-17 DIAGNOSIS — E119 Type 2 diabetes mellitus without complications: Secondary | ICD-10-CM | POA: Diagnosis not present

## 2020-12-17 DIAGNOSIS — E1129 Type 2 diabetes mellitus with other diabetic kidney complication: Secondary | ICD-10-CM | POA: Diagnosis not present

## 2020-12-17 DIAGNOSIS — C50211 Malignant neoplasm of upper-inner quadrant of right female breast: Secondary | ICD-10-CM | POA: Diagnosis not present

## 2020-12-17 DIAGNOSIS — N186 End stage renal disease: Secondary | ICD-10-CM | POA: Diagnosis not present

## 2020-12-17 DIAGNOSIS — D631 Anemia in chronic kidney disease: Secondary | ICD-10-CM | POA: Diagnosis not present

## 2020-12-17 DIAGNOSIS — Z992 Dependence on renal dialysis: Secondary | ICD-10-CM | POA: Diagnosis not present

## 2020-12-20 DIAGNOSIS — Z992 Dependence on renal dialysis: Secondary | ICD-10-CM | POA: Diagnosis not present

## 2020-12-20 DIAGNOSIS — E119 Type 2 diabetes mellitus without complications: Secondary | ICD-10-CM | POA: Diagnosis not present

## 2020-12-20 DIAGNOSIS — N2581 Secondary hyperparathyroidism of renal origin: Secondary | ICD-10-CM | POA: Diagnosis not present

## 2020-12-20 DIAGNOSIS — N186 End stage renal disease: Secondary | ICD-10-CM | POA: Diagnosis not present

## 2020-12-20 DIAGNOSIS — E1129 Type 2 diabetes mellitus with other diabetic kidney complication: Secondary | ICD-10-CM | POA: Diagnosis not present

## 2020-12-20 DIAGNOSIS — D631 Anemia in chronic kidney disease: Secondary | ICD-10-CM | POA: Diagnosis not present

## 2020-12-20 DIAGNOSIS — C50211 Malignant neoplasm of upper-inner quadrant of right female breast: Secondary | ICD-10-CM | POA: Diagnosis not present

## 2020-12-21 DIAGNOSIS — C50211 Malignant neoplasm of upper-inner quadrant of right female breast: Secondary | ICD-10-CM | POA: Diagnosis not present

## 2020-12-22 DIAGNOSIS — C50211 Malignant neoplasm of upper-inner quadrant of right female breast: Secondary | ICD-10-CM | POA: Diagnosis not present

## 2020-12-22 DIAGNOSIS — E119 Type 2 diabetes mellitus without complications: Secondary | ICD-10-CM | POA: Diagnosis not present

## 2020-12-22 DIAGNOSIS — N186 End stage renal disease: Secondary | ICD-10-CM | POA: Diagnosis not present

## 2020-12-22 DIAGNOSIS — D631 Anemia in chronic kidney disease: Secondary | ICD-10-CM | POA: Diagnosis not present

## 2020-12-22 DIAGNOSIS — Z992 Dependence on renal dialysis: Secondary | ICD-10-CM | POA: Diagnosis not present

## 2020-12-22 DIAGNOSIS — N2581 Secondary hyperparathyroidism of renal origin: Secondary | ICD-10-CM | POA: Diagnosis not present

## 2020-12-22 DIAGNOSIS — E1129 Type 2 diabetes mellitus with other diabetic kidney complication: Secondary | ICD-10-CM | POA: Diagnosis not present

## 2020-12-23 DIAGNOSIS — C50211 Malignant neoplasm of upper-inner quadrant of right female breast: Secondary | ICD-10-CM | POA: Diagnosis not present

## 2020-12-24 DIAGNOSIS — E1129 Type 2 diabetes mellitus with other diabetic kidney complication: Secondary | ICD-10-CM | POA: Diagnosis not present

## 2020-12-24 DIAGNOSIS — D631 Anemia in chronic kidney disease: Secondary | ICD-10-CM | POA: Diagnosis not present

## 2020-12-24 DIAGNOSIS — N186 End stage renal disease: Secondary | ICD-10-CM | POA: Diagnosis not present

## 2020-12-24 DIAGNOSIS — C50211 Malignant neoplasm of upper-inner quadrant of right female breast: Secondary | ICD-10-CM | POA: Diagnosis not present

## 2020-12-24 DIAGNOSIS — Z992 Dependence on renal dialysis: Secondary | ICD-10-CM | POA: Diagnosis not present

## 2020-12-24 DIAGNOSIS — C50811 Malignant neoplasm of overlapping sites of right female breast: Secondary | ICD-10-CM | POA: Diagnosis not present

## 2020-12-24 DIAGNOSIS — N2581 Secondary hyperparathyroidism of renal origin: Secondary | ICD-10-CM | POA: Diagnosis not present

## 2020-12-24 DIAGNOSIS — E119 Type 2 diabetes mellitus without complications: Secondary | ICD-10-CM | POA: Diagnosis not present

## 2020-12-27 DIAGNOSIS — C50211 Malignant neoplasm of upper-inner quadrant of right female breast: Secondary | ICD-10-CM | POA: Diagnosis not present

## 2020-12-27 DIAGNOSIS — E119 Type 2 diabetes mellitus without complications: Secondary | ICD-10-CM | POA: Diagnosis not present

## 2020-12-27 DIAGNOSIS — D631 Anemia in chronic kidney disease: Secondary | ICD-10-CM | POA: Diagnosis not present

## 2020-12-27 DIAGNOSIS — N2581 Secondary hyperparathyroidism of renal origin: Secondary | ICD-10-CM | POA: Diagnosis not present

## 2020-12-27 DIAGNOSIS — E1129 Type 2 diabetes mellitus with other diabetic kidney complication: Secondary | ICD-10-CM | POA: Diagnosis not present

## 2020-12-27 DIAGNOSIS — N186 End stage renal disease: Secondary | ICD-10-CM | POA: Diagnosis not present

## 2020-12-27 DIAGNOSIS — Z992 Dependence on renal dialysis: Secondary | ICD-10-CM | POA: Diagnosis not present

## 2020-12-28 ENCOUNTER — Other Ambulatory Visit: Payer: Self-pay

## 2020-12-28 ENCOUNTER — Ambulatory Visit (INDEPENDENT_AMBULATORY_CARE_PROVIDER_SITE_OTHER): Payer: Medicare Other | Admitting: General Surgery

## 2020-12-28 ENCOUNTER — Encounter: Payer: Self-pay | Admitting: General Surgery

## 2020-12-28 VITALS — BP 107/70 | HR 82 | Temp 98.3°F | Resp 14 | Ht 62.0 in | Wt 163.0 lb

## 2020-12-28 DIAGNOSIS — Z09 Encounter for follow-up examination after completed treatment for conditions other than malignant neoplasm: Secondary | ICD-10-CM

## 2020-12-28 DIAGNOSIS — C50211 Malignant neoplasm of upper-inner quadrant of right female breast: Secondary | ICD-10-CM | POA: Diagnosis not present

## 2020-12-28 NOTE — Progress Notes (Signed)
Subjective:     Megan [name]  Patient wanted a second opinion concerning several lungs and her right mastectomy site.  She is undergoing radiation therapy currently.  Her mastectomy scar is somewhat tender to touch.  This has worsened since radiation therapy.  She will complete her radiation therapy though. Objective:    BP 107/70   Pulse 82   Temp 98.3 F (36.8 C) (Other (Comment))   Resp 14   Ht 5\' 2"  (1.575 m)   Wt 163 lb (73.9 kg)   SpO2 93%   BMI 29.81 kg/m   General:  alert, cooperative and no distress  Right mastectomy incision site healing well.  She does have some lumpiness in the upper portion of the chest wall that is mobile.  None seem fixed to the chest wall.     Assessment:    Status post right mastectomy, currently undergoing radiation therapy   I reassured the patient that she is going to have irregularity of the soft tissue due to the radiation therapy.  Even if there is focus of breast cancer there, the radiation therapy is being used to control and possibly eradicated.  There is no need for further work-up at this time.   Plan:   I told the patient to follow-up with me for an examination in July after she is finished radiation therapy.  She was fine with that.

## 2020-12-29 DIAGNOSIS — C50211 Malignant neoplasm of upper-inner quadrant of right female breast: Secondary | ICD-10-CM | POA: Diagnosis not present

## 2020-12-29 DIAGNOSIS — N2581 Secondary hyperparathyroidism of renal origin: Secondary | ICD-10-CM | POA: Diagnosis not present

## 2020-12-29 DIAGNOSIS — C50811 Malignant neoplasm of overlapping sites of right female breast: Secondary | ICD-10-CM | POA: Diagnosis not present

## 2020-12-29 DIAGNOSIS — N186 End stage renal disease: Secondary | ICD-10-CM | POA: Diagnosis not present

## 2020-12-29 DIAGNOSIS — Z992 Dependence on renal dialysis: Secondary | ICD-10-CM | POA: Diagnosis not present

## 2020-12-29 DIAGNOSIS — D631 Anemia in chronic kidney disease: Secondary | ICD-10-CM | POA: Diagnosis not present

## 2020-12-29 DIAGNOSIS — E1129 Type 2 diabetes mellitus with other diabetic kidney complication: Secondary | ICD-10-CM | POA: Diagnosis not present

## 2020-12-29 DIAGNOSIS — E119 Type 2 diabetes mellitus without complications: Secondary | ICD-10-CM | POA: Diagnosis not present

## 2020-12-30 DIAGNOSIS — C50211 Malignant neoplasm of upper-inner quadrant of right female breast: Secondary | ICD-10-CM | POA: Diagnosis not present

## 2020-12-31 ENCOUNTER — Other Ambulatory Visit: Payer: Medicare Other

## 2020-12-31 ENCOUNTER — Encounter: Payer: Self-pay | Admitting: Nurse Practitioner

## 2020-12-31 ENCOUNTER — Other Ambulatory Visit: Payer: Self-pay

## 2020-12-31 ENCOUNTER — Telehealth (INDEPENDENT_AMBULATORY_CARE_PROVIDER_SITE_OTHER): Payer: Medicare Other | Admitting: Nurse Practitioner

## 2020-12-31 DIAGNOSIS — E119 Type 2 diabetes mellitus without complications: Secondary | ICD-10-CM | POA: Diagnosis not present

## 2020-12-31 DIAGNOSIS — R109 Unspecified abdominal pain: Secondary | ICD-10-CM

## 2020-12-31 DIAGNOSIS — R197 Diarrhea, unspecified: Secondary | ICD-10-CM

## 2020-12-31 DIAGNOSIS — E1129 Type 2 diabetes mellitus with other diabetic kidney complication: Secondary | ICD-10-CM | POA: Diagnosis not present

## 2020-12-31 DIAGNOSIS — Z992 Dependence on renal dialysis: Secondary | ICD-10-CM | POA: Diagnosis not present

## 2020-12-31 DIAGNOSIS — N2581 Secondary hyperparathyroidism of renal origin: Secondary | ICD-10-CM | POA: Diagnosis not present

## 2020-12-31 DIAGNOSIS — N186 End stage renal disease: Secondary | ICD-10-CM | POA: Diagnosis not present

## 2020-12-31 DIAGNOSIS — C50211 Malignant neoplasm of upper-inner quadrant of right female breast: Secondary | ICD-10-CM | POA: Diagnosis not present

## 2020-12-31 DIAGNOSIS — D631 Anemia in chronic kidney disease: Secondary | ICD-10-CM | POA: Diagnosis not present

## 2020-12-31 DIAGNOSIS — C50811 Malignant neoplasm of overlapping sites of right female breast: Secondary | ICD-10-CM | POA: Diagnosis not present

## 2020-12-31 LAB — CBC WITH DIFFERENTIAL/PLATELET
Absolute Monocytes: 777 cells/uL (ref 200–950)
Basophils Absolute: 67 cells/uL (ref 0–200)
Basophils Relative: 1 %
Eosinophils Absolute: 80 cells/uL (ref 15–500)
Eosinophils Relative: 1.2 %
HCT: 30.7 % — ABNORMAL LOW (ref 35.0–45.0)
Hemoglobin: 10.2 g/dL — ABNORMAL LOW (ref 11.7–15.5)
Lymphs Abs: 878 cells/uL (ref 850–3900)
MCH: 32.4 pg (ref 27.0–33.0)
MCHC: 33.2 g/dL (ref 32.0–36.0)
MCV: 97.5 fL (ref 80.0–100.0)
MPV: 10.2 fL (ref 7.5–12.5)
Monocytes Relative: 11.6 %
Neutro Abs: 4898 cells/uL (ref 1500–7800)
Neutrophils Relative %: 73.1 %
Platelets: 322 10*3/uL (ref 140–400)
RBC: 3.15 10*6/uL — ABNORMAL LOW (ref 3.80–5.10)
RDW: 16.9 % — ABNORMAL HIGH (ref 11.0–15.0)
Total Lymphocyte: 13.1 %
WBC: 6.7 10*3/uL (ref 3.8–10.8)

## 2020-12-31 LAB — HEPATIC FUNCTION PANEL
AG Ratio: 1.3 (calc) (ref 1.0–2.5)
ALT: 13 U/L (ref 6–29)
AST: 11 U/L (ref 10–35)
Albumin: 3.9 g/dL (ref 3.6–5.1)
Alkaline phosphatase (APISO): 90 U/L (ref 37–153)
Bilirubin, Direct: 0.1 mg/dL (ref 0.0–0.2)
Globulin: 3.1 g/dL (calc) (ref 1.9–3.7)
Indirect Bilirubin: 0.3 mg/dL (calc) (ref 0.2–1.2)
Total Bilirubin: 0.4 mg/dL (ref 0.2–1.2)
Total Protein: 7 g/dL (ref 6.1–8.1)

## 2020-12-31 MED ORDER — CHOLESTYRAMINE 4 G PO PACK: 4.0000 g | PACK | Freq: Every day | ORAL | 1 refills | Status: DC

## 2020-12-31 NOTE — Progress Notes (Signed)
Subjective:    Patient ID: Megan [name], female    DOB: 1961/01/17, 60 y.o.   MRN: [id number]  HPI: Megan [name] is a 60 y.o. female presenting virtually for diarrhea.  Chief Complaint  Patient presents with  . Diarrhea    There is blood in stool, top of stomach is pain. Pt is asking for ref for gastro.   ABDOMINAL ISSUES Reports diarrhea from earlier this month got better.  A couple of weeks ago, it started back.  At times, the top of her stomach hurts - cramping.  Today during dialysis, she had to be unhooked from the machine so she can go to the bathroom.  She noticed there was blood in her stool.  She reports this is not the first time she saw blood in her stool.  In the past, she had banding for hermorrhoids with GI (Dr. Madaline Guthrie) Duration: weeks Petra Kuba: cramping is associated with bowel movements; stomach continues to cramp after BM Location: upper - all the way across  Severity: moderate Radiation: no Episode duration: minutes Frequency: multiple times daily Alleviating factors: husband rubbing on it,  Aggravating factors: nothing Treatments attempted: husband rubbing it Decreased appetite: no Constipation: no Diarrhea: yes Episodes of diarrhea/day: ~3 ; sometimes pure water and sometimes has formed pieces Blood in stool: yes Mucous in the stool: no Heartburn: no  Bloating:yes; a couple weeks Flatulence: yes Nausea: yes Vomiting: no Episodes of vomit/day: 0 Melena or hematochezia: no  Rash: no Jaundice: no Fever: no Weight loss: yes; ~10 lbs since 12/13/2020  No Known Allergies  Outpatient Encounter Medications as of 12/31/2020  Medication Sig  . amLODipine (NORVASC) 10 MG tablet TAKE 1 TABLET BY MOUTH AT BEDTIME  . anastrozole (ARIMIDEX) 1 MG tablet TAKE 1 TABLET(1 MG) BY MOUTH DAILY  . aspirin EC 81 MG tablet Take 81 mg by mouth daily.  Marland Kitchen atorvastatin (LIPITOR) 20 MG tablet Take 1 tablet (20 mg total) by mouth daily. (Patient taking  differently: Take 20 mg by mouth at bedtime.)  . B Complex-C-Folic Acid (DIALYVITE 923) 0.8 MG TABS Take 1 tablet by mouth daily.  . cholestyramine (QUESTRAN) 4 g packet Take 1 packet (4 g total) by mouth daily with lunch.  . cinacalcet (SENSIPAR) 30 MG tablet Take 2 tablets (60 mg total) by mouth daily. (Patient taking differently: Take 30 mg by mouth daily with supper.)  . Darbepoetin Alfa (ARANESP, ALBUMIN FREE, IJ) Darbepoetin Alfa (Aranesp)  . DULoxetine (CYMBALTA) 30 MG capsule TAKE 1 CAPSULE(30 MG) BY MOUTH DAILY (Patient taking differently: Take 30 mg by mouth daily.)  . gabapentin (NEURONTIN) 300 MG capsule TAKE 1 CAPSULE(300 MG) BY MOUTH AT BEDTIME  . hydrALAZINE (APRESOLINE) 25 MG tablet Take by mouth.  Marland Kitchen HYDROcodone-acetaminophen (NORCO) 5-325 MG tablet Take 1 tablet by mouth every 6 (six) hours as needed for moderate pain.  Marland Kitchen lanthanum (FOSRENOL) 1000 MG chewable tablet Chew 2,000-3,000 mg by mouth See admin instructions. Take 3 tablets (3000 mg) by mouth with meals and take 2 tablets (2000 mg) with snacks  . lidocaine-prilocaine (EMLA) cream Apply 1 application topically every Monday, Wednesday, and Friday with hemodialysis.   Marland Kitchen losartan (COZAAR) 100 MG tablet Take 1 tablet by mouth daily.  . nebivolol (BYSTOLIC) 10 MG tablet Take 1 tablet (10 mg total) by mouth in the morning and at bedtime.  . pantoprazole (PROTONIX) 40 MG tablet TAKE 1 TABLET(40 MG) BY MOUTH DAILY   No facility-administered encounter medications on file as of 12/31/2020.  Patient Active Problem List   Diagnosis Date Noted  . Breast mass, right   . History of right breast cancer   . Lobar pneumonia, unspecified organism ([place]) 07/03/2020  . Pleural effusion, right   . S/P left mastectomy 06/15/2020  . Allergy, unspecified, initial encounter 03/03/2020  . Anaphylactic shock, unspecified, initial encounter 03/03/2020  . Breast cancer ([place]) 02/18/2020  . History of modified radical mastectomy of right breast  02/18/2020  . S/P mastectomy, right 02/18/2020  . ESRD (end stage renal disease) on dialysis ([place])   . Grade III hemorrhoids 07/31/2019  . Fluid overload, unspecified 05/24/2019  . Anaphylactic reaction due to adverse effect of correct drug or medicament properly administered, initial encounter 04/30/2019  . Unspecified protein-calorie malnutrition ([place]) 03/03/2019  . Hypertensive heart disease with heart failure ([place]) 03/01/2019  . Atherosclerotic heart disease of native coronary artery without angina pectoris 03/01/2019  . Chest pain 02/28/2019  . Chest pain with high risk for cardiac etiology 02/28/2019  . Elevated troponin 02/03/2019  . ESRD (end stage renal disease) ([place]) 02/03/2019  . Acute diastolic CHF (congestive heart failure) ([place]) 02/03/2019  . Hypokalemia 02/03/2019  . Acute respiratory failure with hypoxia ([place]) 02/03/2019  . 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[place])   . Hypocalcemia 09/03/2018  . Hypothyroidism, unspecified 09/21/2017  . Anemia in chronic kidney disease 04/12/2017  . Iron deficiency anemia, unspecified 04/12/2017  . Abnormal results of kidney function studies 04/11/2017  . Acidosis 04/11/2017  . Anemia, unspecified 04/11/2017  . Coagulation defect, unspecified ([place]) 04/11/2017  . 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, with stage 5 chronic kidney disease, or end stage renal disease ([place]) 04/11/2017  . 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 Other disorders of phosphorus metabolism 04/11/2017  . Pain, unspecified 04/11/2017  . Proteinuria, unspecified 04/11/2017  . Pruritus, unspecified 04/11/2017  . Unspecified kidney failure 04/11/2017  . Encounter for prophylactic removal of breast 04/04/2017  . Rectal bleeding 06/08/2016  . Vaginal discharge 12/30/2015  . Abdominal pain, epigastric 11/26/2013  . GERD (gastroesophageal reflux disease)   . Type 2  diabetes mellitus ([place])   . CKD (chronic kidney disease) stage 3, GFR 30-59 ml/min (H[facility])   . External hemorrhoid 08/01/2011  . Hematochezia 07/13/2011  . Dyspnea 04/21/2010  . Atypical chest pain 04/21/2010  . Mixed hyperlipidemia 03/11/2008  . OBESITY 03/11/2008  . Essential hypertension, benign 03/11/2008    Past Medical History:  Diagnosis Date  . (HFpEF) heart failure with preserved ejection fraction ([place])    a. 01/2019 Echo: EF 55-60%, mild conc LVH. DD.  Torn MV chordae.  . Anemia   . Atypical chest pain    a. 08/2018 MV: EF 59%, no ischemia; b. 02/2019 Cath: nonobs dzs.  . Blood transfusion without reported diagnosis   . Breast cancer ([place]) 10/12/2020  . Cataract   . ESRD (end stage renal disease) on dialysis ([place])    a. HD T, T, S  . Essential hypertension, benign   . GERD (gastroesophageal reflux disease)   . Headache   . Hemorrhoids   . Mixed hyperlipidemia   . Morbid obesity ([place])   . Non-obstructive CAD (coronary artery disease)    a. 02/2019 CathL LM nl, LAD 67m, LCX nl, RCA 25p, EF 55-65%.  Marland Kitchen PONV (postoperative nausea and vomiting)   . S/P colonoscopy Jan 2011   Dr. Benson Norway: sessile polyp (benign lymphoid), large hemorrhoids, repeat 5-10 years  . Temporal  arteritis ([place])   . Type 2 diabetes mellitus ([place])   . Wears glasses     Relevant past medical, surgical, family and social history reviewed and updated as indicated. Interim medical history since our last visit reviewed.  Review of Systems Per HPI unless specifically indicated above     Objective:    There were no vitals taken for this visit.  Wt Readings from Last 3 Encounters:  12/28/20 163 lb (73.9 kg)  11/25/20 167 lb (75.8 kg)  11/10/20 161 lb 12.8 oz (73.4 kg)    Physical Exam Physical examination unable to be performed due to lack of equipment.     Assessment & Plan:  1. Abdominal cramping Acute on chronic.  Differentials include infectious colitis vs. Functional diarrhea with irritable  bowel.  Also, question reaction to radiation given symptoms started a few days after radiation started. She is up-to-date on her colonoscopy.  She recently had a PET scan in February 2020.  Will check ova and parasite, stool culture, and C. difficile.  We will also check H. pylori given abdominal cramping.  Blood counts checked to rule out infectious cause versus anemia and hepatic function panel checked.  Referral placed to GI per patient request.  In the meantime, start on cholestyramine 1 packet by mouth with meal.  Follow-up if symptoms not improving.  - Salmonella/Shigella Cult, Campy EIA and Shiga Toxin reflex; Future - Ova and parasite examination; Future - C. difficile GDH and Toxin A/B; Future - Helicobacter pylori special antigen; Future - Hepatic Function Panel - CBC with Differential - Ambulatory referral to Gastroenterology  2. Diarrhea, unspecified type Acute on chronic.  Differentials include infectious colitis vs. Functional diarrhea with irritable bowel.  Also, question reaction to radiation given symptoms started a few days after radiation started.  She is up-to-date on her colonoscopy.  She recently had a PET scan in February 2020.  Will check ova and parasite, stool culture, and C. difficile.  We will also check H. pylori given abdominal cramping.  Blood counts checked to rule out infectious cause versus anemia and hepatic function panel checked.  Referral placed to GI per patient request.  In the meantime, start on cholestyramine 1 packet by mouth with meal.  Follow-up if symptoms not improving.  - Salmonella/Shigella Cult, Campy EIA and Shiga Toxin reflex; Future - Ova and parasite examination; Future - C. difficile GDH and Toxin A/B; Future - Helicobacter pylori special antigen; Future - Hepatic Function Panel - CBC with Differential - cholestyramine (QUESTRAN) 4 g packet; Take 1 packet (4 g total) by mouth daily with lunch.  Dispense: 60 each; Refill: 1 - Ambulatory  referral to Gastroenterology    Follow up plan: Return if symptoms worsen or fail to improve.   This visit was completed via telephone due to the restrictions of the COVID-19 pandemic. All issues as above were discussed and addressed but no physical exam was performed. If it was felt that the patient should be evaluated in the office, they were directed there. The patient verbally consented to this visit. Patient was unable to complete an audio/visual visit due to Technical difficulties. . Location of the patient: home . Location of the provider: work . Those involved with this call:  . Provider: Noemi Chapel, DNP, FNP-C . CMA: Annabelle Harman, CMA . Front Desk/Registration: Vevelyn Pat  . Time spent on call: 16 minutes on the phone discussing health concerns. 25 minutes total spent in review of patient's record and preparation of their chart.  I verified patient identity using two factors (patient name and date of birth). Patient consents verbally to being seen via telemedicine visit today.

## 2021-01-01 ENCOUNTER — Other Ambulatory Visit: Payer: Self-pay | Admitting: Family Medicine

## 2021-01-01 LAB — C. DIFFICILE GDH AND TOXIN A/B
GDH ANTIGEN: DETECTED
MICRO NUMBER:: 11916411
SPECIMEN QUALITY:: ADEQUATE
TOXIN A AND B: DETECTED

## 2021-01-01 MED ORDER — VANCOMYCIN HCL 125 MG PO CAPS: 125.0000 mg | ORAL_CAPSULE | Freq: Four times a day (QID) | ORAL | 0 refills | Status: AC

## 2021-01-03 ENCOUNTER — Encounter: Payer: Self-pay | Admitting: *Deleted

## 2021-01-03 DIAGNOSIS — N186 End stage renal disease: Secondary | ICD-10-CM | POA: Diagnosis not present

## 2021-01-03 DIAGNOSIS — C50211 Malignant neoplasm of upper-inner quadrant of right female breast: Secondary | ICD-10-CM | POA: Diagnosis not present

## 2021-01-03 DIAGNOSIS — N2581 Secondary hyperparathyroidism of renal origin: Secondary | ICD-10-CM | POA: Diagnosis not present

## 2021-01-03 DIAGNOSIS — D631 Anemia in chronic kidney disease: Secondary | ICD-10-CM | POA: Diagnosis not present

## 2021-01-03 DIAGNOSIS — Z992 Dependence on renal dialysis: Secondary | ICD-10-CM | POA: Diagnosis not present

## 2021-01-03 DIAGNOSIS — E1129 Type 2 diabetes mellitus with other diabetic kidney complication: Secondary | ICD-10-CM | POA: Diagnosis not present

## 2021-01-03 DIAGNOSIS — E119 Type 2 diabetes mellitus without complications: Secondary | ICD-10-CM | POA: Diagnosis not present

## 2021-01-03 LAB — HELICOBACTER PYLORI  SPECIAL ANTIGEN
MICRO NUMBER:: 11916438
SPECIMEN QUALITY: ADEQUATE

## 2021-01-03 LAB — SALMONELLA/SHIGELLA CULT, CAMPY EIA AND SHIGA TOXIN RFL ECOLI
MICRO NUMBER: 11916433
MICRO NUMBER:: 11916434
MICRO NUMBER:: 11916435
Result:: NOT DETECTED
SHIGA RESULT:: NOT DETECTED
SPECIMEN QUALITY: ADEQUATE
SPECIMEN QUALITY:: ADEQUATE
SPECIMEN QUALITY:: ADEQUATE

## 2021-01-04 ENCOUNTER — Ambulatory Visit (HOSPITAL_COMMUNITY)
Admission: RE | Admit: 2021-01-04 | Discharge: 2021-01-04 | Disposition: A | Discharge status: Home / Self Care | Payer: Medicare Other | Source: Ambulatory Visit | Attending: Hematology | Admitting: Hematology

## 2021-01-04 ENCOUNTER — Other Ambulatory Visit: Payer: Self-pay

## 2021-01-04 DIAGNOSIS — C50211 Malignant neoplasm of upper-inner quadrant of right female breast: Secondary | ICD-10-CM

## 2021-01-04 DIAGNOSIS — Z170 Estrogen receptor positive status [ER+]: Secondary | ICD-10-CM

## 2021-01-04 DIAGNOSIS — Z0189 Encounter for other specified special examinations: Secondary | ICD-10-CM

## 2021-01-04 DIAGNOSIS — E1122 Type 2 diabetes mellitus with diabetic chronic kidney disease: Secondary | ICD-10-CM | POA: Diagnosis not present

## 2021-01-04 DIAGNOSIS — I340 Nonrheumatic mitral (valve) insufficiency: Secondary | ICD-10-CM | POA: Diagnosis not present

## 2021-01-04 DIAGNOSIS — N186 End stage renal disease: Secondary | ICD-10-CM | POA: Diagnosis not present

## 2021-01-04 DIAGNOSIS — I1311 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, with stage 5 chronic kidney disease, or end stage renal disease: Secondary | ICD-10-CM | POA: Insufficient documentation

## 2021-01-04 DIAGNOSIS — Z01818 Encounter for other preprocedural examination: Secondary | ICD-10-CM | POA: Diagnosis not present

## 2021-01-04 DIAGNOSIS — Z992 Dependence on renal dialysis: Secondary | ICD-10-CM | POA: Diagnosis not present

## 2021-01-04 DIAGNOSIS — E785 Hyperlipidemia, unspecified: Secondary | ICD-10-CM | POA: Diagnosis not present

## 2021-01-04 LAB — ECHOCARDIOGRAM COMPLETE
Area-P 1/2: 3.46 cm2
S' Lateral: 2.9 cm

## 2021-01-04 NOTE — Progress Notes (Signed)
*  PRELIMINARY RESULTS* Echocardiogram 2D Echocardiogram has been performed.  Samuel Germany 01/04/2021, 10:14 AM

## 2021-01-05 DIAGNOSIS — E119 Type 2 diabetes mellitus without complications: Secondary | ICD-10-CM | POA: Diagnosis not present

## 2021-01-05 DIAGNOSIS — N186 End stage renal disease: Secondary | ICD-10-CM | POA: Diagnosis not present

## 2021-01-05 DIAGNOSIS — N2581 Secondary hyperparathyroidism of renal origin: Secondary | ICD-10-CM | POA: Diagnosis not present

## 2021-01-05 DIAGNOSIS — D631 Anemia in chronic kidney disease: Secondary | ICD-10-CM | POA: Diagnosis not present

## 2021-01-05 DIAGNOSIS — E1129 Type 2 diabetes mellitus with other diabetic kidney complication: Secondary | ICD-10-CM | POA: Diagnosis not present

## 2021-01-05 DIAGNOSIS — C50211 Malignant neoplasm of upper-inner quadrant of right female breast: Secondary | ICD-10-CM | POA: Diagnosis not present

## 2021-01-05 DIAGNOSIS — Z992 Dependence on renal dialysis: Secondary | ICD-10-CM | POA: Diagnosis not present

## 2021-01-05 LAB — OVA AND PARASITE EXAMINATION
CONCENTRATE RESULT:: NONE SEEN
MICRO NUMBER:: 11916439
SPECIMEN QUALITY:: ADEQUATE
TRICHROME RESULT:: NONE SEEN

## 2021-01-06 ENCOUNTER — Other Ambulatory Visit: Payer: Self-pay

## 2021-01-06 ENCOUNTER — Ambulatory Visit (INDEPENDENT_AMBULATORY_CARE_PROVIDER_SITE_OTHER): Payer: Medicare Other | Admitting: Family Medicine

## 2021-01-06 ENCOUNTER — Encounter: Payer: Self-pay | Admitting: Family Medicine

## 2021-01-06 VITALS — BP 136/88 | HR 84 | Temp 98.2°F | Resp 16 | Ht 62.0 in | Wt 167.0 lb

## 2021-01-06 DIAGNOSIS — A0472 Enterocolitis due to Clostridium difficile, not specified as recurrent: Secondary | ICD-10-CM

## 2021-01-06 DIAGNOSIS — C50211 Malignant neoplasm of upper-inner quadrant of right female breast: Secondary | ICD-10-CM | POA: Diagnosis not present

## 2021-01-06 NOTE — Progress Notes (Signed)
Subjective:    Patient ID: Megan [name], female    DOB: 11-03-1960, 60 y.o.   MRN: [id number]  Patient is a very sweet 60 year old African-American female who was recently diagnosed with C. difficile colitis.  She was started on vancomycin 125 mg p.o. 4 times daily for 10 days.  She is approximately 5 days into therapy.  She states she is about 50% better.  The swollen distended abdomen and the crampy severe abdominal pain have subsided.  Her abdomen is still somewhat bloated but she feels much better.  She states that yesterday was the first day that she has not had to interrupt her dialysis due to having diarrhea.  Therefore she certainly improving Past Medical History:  Diagnosis Date  . (HFpEF) heart failure with preserved ejection fraction ([place])    a. 01/2019 Echo: EF 55-60%, mild conc LVH. DD.  Torn MV chordae.  . Anemia   . Atypical chest pain    a. 08/2018 MV: EF 59%, no ischemia; b. 02/2019 Cath: nonobs dzs.  . Blood transfusion without reported diagnosis   . Breast cancer ([place]) 10/12/2020  . Cataract   . ESRD (end stage renal disease) on dialysis ([place])    a. HD T, T, S  . Essential hypertension, benign   . GERD (gastroesophageal reflux disease)   . Headache   . Hemorrhoids   . Mixed hyperlipidemia   . Morbid obesity ([place])   . Non-obstructive CAD (coronary artery disease)    a. 02/2019 CathL LM nl, LAD 42m, LCX nl, RCA 25p, EF 55-65%.  Marland Kitchen PONV (postoperative nausea and vomiting)   . S/P colonoscopy Jan 2011   Dr. Benson Norway: sessile polyp (benign lymphoid), large hemorrhoids, repeat 5-10 years  . Temporal arteritis ([place])   . Type 2 diabetes mellitus ([place])   . Wears glasses    Past Surgical History:  Procedure Laterality Date  . ABDOMINAL HYSTERECTOMY    . APPENDECTOMY    . ARTERY BIOPSY N/A 05/09/2018   Procedure: RIGHT TEMPORAL ARTERY BIOPSY;  Surgeon: Judeth Horn, MD;  Location: [facility];  Service: General;  Laterality: N/A;  . BREAST BIOPSY Right 06/15/2020   Procedure:  RIGHT BREAST BIOPSY;  Surgeon: Aviva Signs, MD;  [facility];  Service: General;  Laterality: Right;  . CATARACT EXTRACTION W/PHACO Left 02/09/2017   Procedure: CATARACT EXTRACTION PHACO AND INTRAOCULAR LENS PLACEMENT LEFT EYE;  Surgeon: Tonny Branch, MD;  [facility];  Service: Ophthalmology;  Laterality: Left;  CDE: 4.89  . CATARACT EXTRACTION W/PHACO Right 06/04/2017   Procedure: CATARACT EXTRACTION PHACO AND INTRAOCULAR LENS PLACEMENT (IOC);  Surgeon: Tonny Branch, MD;  [facility];  Service: Ophthalmology;  Laterality: Right;  CDE: 4.12  . CHOLECYSTECTOMY  09/29/2011   Procedure: LAPAROSCOPIC CHOLECYSTECTOMY;  Surgeon: Jamesetta So, MD;  [facility];  Service: General;  Laterality: N/A;  . COLONOSCOPY  Jan 2011   Dr. Benson Norway: sessile polyp (benign lymphoid), large hemorrhoids, repeat 5-10 years  . COLONOSCOPY N/A 06/12/2016   Procedure: COLONOSCOPY;  Surgeon: Daneil Dolin, MD;  [facility];  Service: Endoscopy;  Laterality: N/A;  1230   . ESOPHAGOGASTRODUODENOSCOPY  09/05/2011   [id number]:[id number] hiatal hernia; remainder of exam normal. No explanation for patient's abdominal pain with today's examination  . ESOPHAGOGASTRODUODENOSCOPY N/A 12/17/2013   Dr. [name]: gastric erythema, erosion, mild chronic inflammation on path   . EXCISION OF BREAST BIOPSY Right 10/12/2020   Procedure: EXCISION OF RIGHT BREAST BIOPSY;  Surgeon: Aviva Signs, MD;  [facility];  Service: General;  Laterality: Right;  . LAPAROSCOPIC APPENDECTOMY  09/29/2011   Procedure: APPENDECTOMY LAPAROSCOPIC;  Surgeon: Jamesetta So, MD;  [facility];  Service: General;;  incidental appendectomy  . LEFT HEART CATH AND CORONARY ANGIOGRAPHY N/A 02/28/2019   Procedure: LEFT HEART CATH AND CORONARY ANGIOGRAPHY;  Surgeon: Jettie Booze, MD;  Location: [facility];  Service: Cardiovascular;  Laterality: N/A;  . MASTECTOMY MODIFIED RADICAL Right 02/18/2020   Procedure: MASTECTOMY MODIFIED  RADICAL;  Surgeon: Aviva Signs, MD;  [facility];  Service: General;  Laterality: Right;  . MASTECTOMY, PARTIAL Right 07/13/2020   Procedure: RIGHT PARTIAL MASTECTOMY;  Surgeon: Aviva Signs, MD;  [facility];  Service: General;  Laterality: Right;  . PARTIAL MASTECTOMY WITH NEEDLE LOCALIZATION AND AXILLARY SENTINEL LYMPH NODE BX Right 09/18/2018   Procedure: RIGHT PARTIAL MASTECTOMY AFTER NEEDLE LOCALIZATION, SENTINEL LYMPH NODE BIOPSY RIGHT AXILLA;  Surgeon: Aviva Signs, MD;  [facility];  Service: General;  Laterality: Right;  . SIMPLE MASTECTOMY WITH AXILLARY SENTINEL NODE BIOPSY Left 06/15/2020   Procedure: LEFT SIMPLE MASTECTOMY;  Surgeon: Aviva Signs, MD;  [facility];  Service: General;  Laterality: Left;   Current Outpatient Medications on File Prior to Visit  Medication Sig Dispense Refill  . amLODipine (NORVASC) 10 MG tablet TAKE 1 TABLET BY MOUTH AT BEDTIME 90 tablet 3  . anastrozole (ARIMIDEX) 1 MG tablet TAKE 1 TABLET(1 MG) BY MOUTH DAILY 30 tablet 4  . aspirin EC 81 MG tablet Take 81 mg by mouth daily.    Marland Kitchen atorvastatin (LIPITOR) 20 MG tablet Take 1 tablet (20 mg total) by mouth daily. (Patient taking differently: Take 20 mg by mouth at bedtime.) 90 tablet 3  . B Complex-C-Folic Acid (DIALYVITE 244) 0.8 MG TABS Take 1 tablet by mouth daily.    . cholestyramine (QUESTRAN) 4 g packet Take 1 packet (4 g total) by mouth daily with lunch. 60 each 1  . cinacalcet (SENSIPAR) 30 MG tablet Take 2 tablets (60 mg total) by mouth daily. (Patient taking differently: Take 30 mg by mouth daily with supper.) 90 tablet 0  . Darbepoetin Alfa (ARANESP, ALBUMIN FREE, IJ) Darbepoetin Alfa (Aranesp)    . DULoxetine (CYMBALTA) 30 MG capsule TAKE 1 CAPSULE(30 MG) BY MOUTH DAILY (Patient taking differently: Take 30 mg by mouth daily.) 90 capsule 1  . gabapentin (NEURONTIN) 300 MG capsule TAKE 1 CAPSULE(300 MG) BY MOUTH AT BEDTIME 90 capsule 1  . hydrALAZINE (APRESOLINE) 25 MG tablet  Take by mouth.    Marland Kitchen HYDROcodone-acetaminophen (NORCO) 5-325 MG tablet Take 1 tablet by mouth every 6 (six) hours as needed for moderate pain. 20 tablet 0  . lanthanum (FOSRENOL) 1000 MG chewable tablet Chew 2,000-3,000 mg by mouth See admin instructions. Take 3 tablets (3000 mg) by mouth with meals and take 2 tablets (2000 mg) with snacks    . lidocaine-prilocaine (EMLA) cream Apply 1 application topically every Monday, Wednesday, and Friday with hemodialysis.     Marland Kitchen losartan (COZAAR) 100 MG tablet Take 1 tablet by mouth daily.    . nebivolol (BYSTOLIC) 10 MG tablet Take 1 tablet (10 mg total) by mouth in the morning and at bedtime. 180 tablet 3  . pantoprazole (PROTONIX) 40 MG tablet TAKE 1 TABLET(40 MG) BY MOUTH DAILY 90 tablet 3  . vancomycin (VANCOCIN) 125 MG capsule Take 1 capsule (125 mg total) by mouth 4 (four) times daily for 10 days. 40 capsule 0   No  current facility-administered medications on file prior to visit.   No Known Allergies Social History   Socioeconomic History  . Marital status: Married    Spouse name: Not on file  . Number of children: Not on file  . Years of education: Not on file  . Highest education level: Not on file  Occupational History  . 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[id number]  Tobacco Use  . Smoking status: Never Smoker  . Smokeless tobacco: Never Used  Vaping Use  . Vaping Use: Never used  Substance and Sexual Activity  . Alcohol use: No  . Drug use: No  . Sexual activity: Yes    Birth control/protection: Surgical  Other Topics Concern  . Not on file  Social History Narrative   Works at [employer] in [place].    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   Also has to get items from high shelves-causes achy pain in shoulder area      Married.   Children are grown, out of house.   Social Determinants of Health   Financial Resource Strain: Not on file  Food Insecurity: Not on file  Transportation Needs: Not on file   Physical Activity: Not on file  Stress: Not on file  Social Connections: Not on file  Intimate Partner Violence: Not At Risk  . Fear of Current or Ex-Partner: No  . Emotionally Abused: No  . Physically Abused: No  . Sexually Abused: No    Past Medical History:  Diagnosis Date  . (HFpEF) heart failure with preserved ejection fraction ([place])    a. 01/2019 Echo: EF 55-60%, mild conc LVH. DD.  Torn MV chordae.  . Anemia   . Atypical chest pain    a. 08/2018 MV: EF 59%, no ischemia; b. 02/2019 Cath: nonobs dzs.  . Blood transfusion without reported diagnosis   . Breast cancer ([place]) 10/12/2020  . Cataract   . ESRD (end stage renal disease) on dialysis ([place])    a. HD T, T, S  . Essential hypertension, benign   . GERD (gastroesophageal reflux disease)   . Headache   . Hemorrhoids   . Mixed hyperlipidemia   . Morbid obesity ([place])   . Non-obstructive CAD (coronary artery disease)    a. 02/2019 CathL LM nl, LAD 68m, LCX nl, RCA 25p, EF 55-65%.  Marland Kitchen PONV (postoperative nausea and vomiting)   . S/P colonoscopy Jan 2011   Dr. Benson Norway: sessile polyp (benign lymphoid), large hemorrhoids, repeat 5-10 years  . Temporal arteritis ([place])   . Type 2 diabetes mellitus ([place])   . Wears glasses    Past Surgical History:  Procedure Laterality Date  . ABDOMINAL HYSTERECTOMY    . APPENDECTOMY    . ARTERY BIOPSY N/A 05/09/2018   Procedure: RIGHT TEMPORAL ARTERY BIOPSY;  Surgeon: Judeth Horn, MD;  Location: [facility];  Service: General;  Laterality: N/A;  . BREAST BIOPSY Right 06/15/2020   Procedure: RIGHT BREAST BIOPSY;  Surgeon: Aviva Signs, MD;  [facility];  Service: General;  Laterality: Right;  . CATARACT EXTRACTION W/PHACO Left 02/09/2017   Procedure: CATARACT EXTRACTION PHACO AND INTRAOCULAR LENS PLACEMENT LEFT EYE;  Surgeon: Tonny Branch, MD;  [facility];  Service: Ophthalmology;  Laterality: Left;  CDE: 4.89  . CATARACT EXTRACTION W/PHACO Right 06/04/2017   Procedure: CATARACT EXTRACTION  PHACO AND INTRAOCULAR LENS PLACEMENT (IOC);  Surgeon: Tonny Branch, MD;  [facility];  Service: Ophthalmology;  Laterality: Right;  CDE: 4.12  . CHOLECYSTECTOMY  09/29/2011   Procedure: LAPAROSCOPIC CHOLECYSTECTOMY;  Surgeon: Jamesetta So, MD;  [facility];  Service: General;  Laterality: N/A;  . COLONOSCOPY  Jan 2011   Dr. Benson Norway: sessile polyp (benign lymphoid), large hemorrhoids, repeat 5-10 years  . COLONOSCOPY N/A 06/12/2016   Procedure: COLONOSCOPY;  Surgeon: Daneil Dolin, MD;  [facility];  Service: Endoscopy;  Laterality: N/A;  1230   . ESOPHAGOGASTRODUODENOSCOPY  09/05/2011   VVO:HYWVP hiatal hernia; remainder of exam normal. No explanation for patient's abdominal pain with today's examination  . ESOPHAGOGASTRODUODENOSCOPY N/A 12/17/2013   Dr. [name]: gastric erythema, erosion, mild chronic inflammation on path   . EXCISION OF BREAST BIOPSY Right 10/12/2020   Procedure: EXCISION OF RIGHT BREAST BIOPSY;  Surgeon: Aviva Signs, MD;  [facility];  Service: General;  Laterality: Right;  . LAPAROSCOPIC APPENDECTOMY  09/29/2011   Procedure: APPENDECTOMY LAPAROSCOPIC;  Surgeon: Jamesetta So, MD;  [facility];  Service: General;;  incidental appendectomy  . LEFT HEART CATH AND CORONARY ANGIOGRAPHY N/A 02/28/2019   Procedure: LEFT HEART CATH AND CORONARY ANGIOGRAPHY;  Surgeon: Jettie Booze, MD;  Location: [facility];  Service: Cardiovascular;  Laterality: N/A;  . MASTECTOMY MODIFIED RADICAL Right 02/18/2020   Procedure: MASTECTOMY MODIFIED RADICAL;  Surgeon: Aviva Signs, MD;  [facility];  Service: General;  Laterality: Right;  . MASTECTOMY, PARTIAL Right 07/13/2020   Procedure: RIGHT PARTIAL MASTECTOMY;  Surgeon: Aviva Signs, MD;  [facility];  Service: General;  Laterality: Right;  . PARTIAL MASTECTOMY WITH NEEDLE LOCALIZATION AND AXILLARY SENTINEL LYMPH NODE BX Right 09/18/2018   Procedure: RIGHT PARTIAL MASTECTOMY AFTER NEEDLE  LOCALIZATION, SENTINEL LYMPH NODE BIOPSY RIGHT AXILLA;  Surgeon: Aviva Signs, MD;  [facility];  Service: General;  Laterality: Right;  . SIMPLE MASTECTOMY WITH AXILLARY SENTINEL NODE BIOPSY Left 06/15/2020   Procedure: LEFT SIMPLE MASTECTOMY;  Surgeon: Aviva Signs, MD;  [facility];  Service: General;  Laterality: Left;   Current Outpatient Medications on File Prior to Visit  Medication Sig Dispense Refill  . amLODipine (NORVASC) 10 MG tablet TAKE 1 TABLET BY MOUTH AT BEDTIME 90 tablet 3  . anastrozole (ARIMIDEX) 1 MG tablet TAKE 1 TABLET(1 MG) BY MOUTH DAILY 30 tablet 4  . aspirin EC 81 MG tablet Take 81 mg by mouth daily.    Marland Kitchen atorvastatin (LIPITOR) 20 MG tablet Take 1 tablet (20 mg total) by mouth daily. (Patient taking differently: Take 20 mg by mouth at bedtime.) 90 tablet 3  . B Complex-C-Folic Acid (DIALYVITE 710) 0.8 MG TABS Take 1 tablet by mouth daily.    . cholestyramine (QUESTRAN) 4 g packet Take 1 packet (4 g total) by mouth daily with lunch. 60 each 1  . cinacalcet (SENSIPAR) 30 MG tablet Take 2 tablets (60 mg total) by mouth daily. (Patient taking differently: Take 30 mg by mouth daily with supper.) 90 tablet 0  . Darbepoetin Alfa (ARANESP, ALBUMIN FREE, IJ) Darbepoetin Alfa (Aranesp)    . DULoxetine (CYMBALTA) 30 MG capsule TAKE 1 CAPSULE(30 MG) BY MOUTH DAILY (Patient taking differently: Take 30 mg by mouth daily.) 90 capsule 1  . gabapentin (NEURONTIN) 300 MG capsule TAKE 1 CAPSULE(300 MG) BY MOUTH AT BEDTIME 90 capsule 1  . hydrALAZINE (APRESOLINE) 25 MG tablet Take by mouth.    Marland Kitchen HYDROcodone-acetaminophen (NORCO) 5-325 MG tablet Take 1 tablet by mouth every 6 (six) hours as needed for moderate pain.  20 tablet 0  . lanthanum (FOSRENOL) 1000 MG chewable tablet Chew 2,000-3,000 mg by mouth See admin instructions. Take 3 tablets (3000 mg) by mouth with meals and take 2 tablets (2000 mg) with snacks    . lidocaine-prilocaine (EMLA) cream Apply 1 application topically  every Monday, Wednesday, and Friday with hemodialysis.     Marland Kitchen losartan (COZAAR) 100 MG tablet Take 1 tablet by mouth daily.    . nebivolol (BYSTOLIC) 10 MG tablet Take 1 tablet (10 mg total) by mouth in the morning and at bedtime. 180 tablet 3  . pantoprazole (PROTONIX) 40 MG tablet TAKE 1 TABLET(40 MG) BY MOUTH DAILY 90 tablet 3  . vancomycin (VANCOCIN) 125 MG capsule Take 1 capsule (125 mg total) by mouth 4 (four) times daily for 10 days. 40 capsule 0   No current facility-administered medications on file prior to visit.   No Known Allergies Social History   Socioeconomic History  . Marital status: Married    Spouse name: Not on file  . Number of children: Not on file  . Years of education: Not on file  . Highest education level: Not on file  Occupational History  . 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[id number]  Tobacco Use  . Smoking status: Never Smoker  . Smokeless tobacco: Never Used  Vaping Use  . Vaping Use: Never used  Substance and Sexual Activity  . Alcohol use: No  . Drug use: No  . Sexual activity: Yes    Birth control/protection: Surgical  Other Topics Concern  . Not on file  Social History Narrative   Works at [employer] in [place].    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   Also has to get items from high shelves-causes achy pain in shoulder area      Married.   Children are grown, out of house.   Social Determinants of Health   Financial Resource Strain: Not on file  Food Insecurity: Not on file  Transportation Needs: Not on file  Physical Activity: Not on file  Stress: Not on file  Social Connections: Not on file  Intimate Partner Violence: Not At Risk  . Fear of Current or Ex-Partner: No  . Emotionally Abused: No  . Physically Abused: No  . Sexually Abused: No      Review of Systems  All other systems reviewed and are negative.      Objective:   Physical Exam Vitals reviewed.  Constitutional:      General: She is  not in acute distress.    Appearance: Normal appearance. She is normal weight.  Cardiovascular:     Rate and Rhythm: Normal rate and regular rhythm.     Pulses: Normal pulses.     Heart sounds: Murmur (thrill from AV fistual heard as murmur) heard.    Pulmonary:     Effort: Pulmonary effort is normal. No respiratory distress.     Breath sounds: Normal breath sounds. No stridor. No wheezing, rhonchi or rales.  Chest:     Chest wall: No tenderness.  Musculoskeletal:     Right lower leg: No edema.     Left lower leg: No edema.  Neurological:     Mental Status: She is alert.           Assessment & Plan:  Clostridium difficile colitis - Plan: C. difficile GDH and Toxin A/B  Complete the 10-day course of vancomycin.  Repeat stool studies to  rule out C. difficile toxin at the completion of antibiotics.  Patient will require prolonged taper if persistent

## 2021-01-07 DIAGNOSIS — Z992 Dependence on renal dialysis: Secondary | ICD-10-CM | POA: Diagnosis not present

## 2021-01-07 DIAGNOSIS — E119 Type 2 diabetes mellitus without complications: Secondary | ICD-10-CM | POA: Diagnosis not present

## 2021-01-07 DIAGNOSIS — D631 Anemia in chronic kidney disease: Secondary | ICD-10-CM | POA: Diagnosis not present

## 2021-01-07 DIAGNOSIS — E1129 Type 2 diabetes mellitus with other diabetic kidney complication: Secondary | ICD-10-CM | POA: Diagnosis not present

## 2021-01-07 DIAGNOSIS — N186 End stage renal disease: Secondary | ICD-10-CM | POA: Diagnosis not present

## 2021-01-07 DIAGNOSIS — N2581 Secondary hyperparathyroidism of renal origin: Secondary | ICD-10-CM | POA: Diagnosis not present

## 2021-01-07 DIAGNOSIS — C50811 Malignant neoplasm of overlapping sites of right female breast: Secondary | ICD-10-CM | POA: Diagnosis not present

## 2021-01-07 DIAGNOSIS — C50211 Malignant neoplasm of upper-inner quadrant of right female breast: Secondary | ICD-10-CM | POA: Diagnosis not present

## 2021-01-10 DIAGNOSIS — D631 Anemia in chronic kidney disease: Secondary | ICD-10-CM | POA: Diagnosis not present

## 2021-01-10 DIAGNOSIS — Z992 Dependence on renal dialysis: Secondary | ICD-10-CM | POA: Diagnosis not present

## 2021-01-10 DIAGNOSIS — N2581 Secondary hyperparathyroidism of renal origin: Secondary | ICD-10-CM | POA: Diagnosis not present

## 2021-01-10 DIAGNOSIS — E1129 Type 2 diabetes mellitus with other diabetic kidney complication: Secondary | ICD-10-CM | POA: Diagnosis not present

## 2021-01-10 DIAGNOSIS — E119 Type 2 diabetes mellitus without complications: Secondary | ICD-10-CM | POA: Diagnosis not present

## 2021-01-10 DIAGNOSIS — N186 End stage renal disease: Secondary | ICD-10-CM | POA: Diagnosis not present

## 2021-01-11 DIAGNOSIS — C50211 Malignant neoplasm of upper-inner quadrant of right female breast: Secondary | ICD-10-CM | POA: Diagnosis not present

## 2021-01-12 DIAGNOSIS — N040 Nephrotic syndrome with minor glomerular abnormality: Secondary | ICD-10-CM | POA: Diagnosis not present

## 2021-01-12 DIAGNOSIS — N2581 Secondary hyperparathyroidism of renal origin: Secondary | ICD-10-CM | POA: Diagnosis not present

## 2021-01-12 DIAGNOSIS — D631 Anemia in chronic kidney disease: Secondary | ICD-10-CM | POA: Diagnosis not present

## 2021-01-12 DIAGNOSIS — E1129 Type 2 diabetes mellitus with other diabetic kidney complication: Secondary | ICD-10-CM | POA: Diagnosis not present

## 2021-01-12 DIAGNOSIS — Z992 Dependence on renal dialysis: Secondary | ICD-10-CM | POA: Diagnosis not present

## 2021-01-12 DIAGNOSIS — E119 Type 2 diabetes mellitus without complications: Secondary | ICD-10-CM | POA: Diagnosis not present

## 2021-01-12 DIAGNOSIS — C50211 Malignant neoplasm of upper-inner quadrant of right female breast: Secondary | ICD-10-CM | POA: Diagnosis not present

## 2021-01-12 DIAGNOSIS — N186 End stage renal disease: Secondary | ICD-10-CM | POA: Diagnosis not present

## 2021-01-13 DIAGNOSIS — C50211 Malignant neoplasm of upper-inner quadrant of right female breast: Secondary | ICD-10-CM | POA: Diagnosis not present

## 2021-01-14 ENCOUNTER — Other Ambulatory Visit: Payer: Medicare Other

## 2021-01-14 ENCOUNTER — Other Ambulatory Visit: Payer: Self-pay

## 2021-01-14 DIAGNOSIS — A0472 Enterocolitis due to Clostridium difficile, not specified as recurrent: Secondary | ICD-10-CM | POA: Diagnosis not present

## 2021-01-14 DIAGNOSIS — D631 Anemia in chronic kidney disease: Secondary | ICD-10-CM | POA: Diagnosis not present

## 2021-01-14 DIAGNOSIS — N186 End stage renal disease: Secondary | ICD-10-CM | POA: Diagnosis not present

## 2021-01-14 DIAGNOSIS — Z992 Dependence on renal dialysis: Secondary | ICD-10-CM | POA: Diagnosis not present

## 2021-01-14 DIAGNOSIS — E1129 Type 2 diabetes mellitus with other diabetic kidney complication: Secondary | ICD-10-CM | POA: Diagnosis not present

## 2021-01-14 DIAGNOSIS — C50211 Malignant neoplasm of upper-inner quadrant of right female breast: Secondary | ICD-10-CM | POA: Diagnosis not present

## 2021-01-14 DIAGNOSIS — N2581 Secondary hyperparathyroidism of renal origin: Secondary | ICD-10-CM | POA: Diagnosis not present

## 2021-01-15 LAB — C. DIFFICILE GDH AND TOXIN A/B
GDH ANTIGEN: NOT DETECTED
MICRO NUMBER:: 11966411
SPECIMEN QUALITY:: ADEQUATE
TOXIN A AND B: NOT DETECTED

## 2021-01-17 DIAGNOSIS — N186 End stage renal disease: Secondary | ICD-10-CM | POA: Diagnosis not present

## 2021-01-17 DIAGNOSIS — C50811 Malignant neoplasm of overlapping sites of right female breast: Secondary | ICD-10-CM | POA: Diagnosis not present

## 2021-01-17 DIAGNOSIS — E1129 Type 2 diabetes mellitus with other diabetic kidney complication: Secondary | ICD-10-CM | POA: Diagnosis not present

## 2021-01-17 DIAGNOSIS — C50211 Malignant neoplasm of upper-inner quadrant of right female breast: Secondary | ICD-10-CM | POA: Diagnosis not present

## 2021-01-17 DIAGNOSIS — D631 Anemia in chronic kidney disease: Secondary | ICD-10-CM | POA: Diagnosis not present

## 2021-01-17 DIAGNOSIS — N2581 Secondary hyperparathyroidism of renal origin: Secondary | ICD-10-CM | POA: Diagnosis not present

## 2021-01-17 DIAGNOSIS — Z992 Dependence on renal dialysis: Secondary | ICD-10-CM | POA: Diagnosis not present

## 2021-01-18 DIAGNOSIS — C50211 Malignant neoplasm of upper-inner quadrant of right female breast: Secondary | ICD-10-CM | POA: Diagnosis not present

## 2021-01-18 DIAGNOSIS — C50811 Malignant neoplasm of overlapping sites of right female breast: Secondary | ICD-10-CM | POA: Diagnosis not present

## 2021-01-19 ENCOUNTER — Other Ambulatory Visit: Payer: Self-pay

## 2021-01-19 ENCOUNTER — Encounter (HOSPITAL_COMMUNITY): Payer: Self-pay

## 2021-01-19 ENCOUNTER — Encounter (HOSPITAL_COMMUNITY): Payer: Self-pay | Admitting: Hematology

## 2021-01-19 ENCOUNTER — Emergency Department (HOSPITAL_COMMUNITY)
Admission: EM | Admit: 2021-01-19 | Discharge: 2021-01-19 | Disposition: A | Discharge status: Home / Self Care | Payer: Medicare Other | Attending: Emergency Medicine | Admitting: Emergency Medicine

## 2021-01-19 DIAGNOSIS — R197 Diarrhea, unspecified: Secondary | ICD-10-CM | POA: Diagnosis present

## 2021-01-19 DIAGNOSIS — Z853 Personal history of malignant neoplasm of breast: Secondary | ICD-10-CM | POA: Insufficient documentation

## 2021-01-19 DIAGNOSIS — Z79899 Other long term (current) drug therapy: Secondary | ICD-10-CM | POA: Insufficient documentation

## 2021-01-19 DIAGNOSIS — Z7982 Long term (current) use of aspirin: Secondary | ICD-10-CM | POA: Diagnosis not present

## 2021-01-19 DIAGNOSIS — A0472 Enterocolitis due to Clostridium difficile, not specified as recurrent: Secondary | ICD-10-CM | POA: Insufficient documentation

## 2021-01-19 DIAGNOSIS — I5031 Acute diastolic (congestive) heart failure: Secondary | ICD-10-CM | POA: Diagnosis not present

## 2021-01-19 DIAGNOSIS — Z992 Dependence on renal dialysis: Secondary | ICD-10-CM | POA: Diagnosis not present

## 2021-01-19 DIAGNOSIS — N2581 Secondary hyperparathyroidism of renal origin: Secondary | ICD-10-CM | POA: Diagnosis not present

## 2021-01-19 DIAGNOSIS — I132 Hypertensive heart and chronic kidney disease with heart failure and with stage 5 chronic kidney disease, or end stage renal disease: Secondary | ICD-10-CM | POA: Insufficient documentation

## 2021-01-19 DIAGNOSIS — N186 End stage renal disease: Secondary | ICD-10-CM | POA: Diagnosis not present

## 2021-01-19 DIAGNOSIS — E039 Hypothyroidism, unspecified: Secondary | ICD-10-CM | POA: Diagnosis not present

## 2021-01-19 DIAGNOSIS — E1122 Type 2 diabetes mellitus with diabetic chronic kidney disease: Secondary | ICD-10-CM | POA: Insufficient documentation

## 2021-01-19 DIAGNOSIS — I2510 Atherosclerotic heart disease of native coronary artery without angina pectoris: Secondary | ICD-10-CM | POA: Diagnosis not present

## 2021-01-19 DIAGNOSIS — R000 Tachycardia, unspecified: Secondary | ICD-10-CM | POA: Diagnosis not present

## 2021-01-19 DIAGNOSIS — R531 Weakness: Secondary | ICD-10-CM | POA: Diagnosis not present

## 2021-01-19 DIAGNOSIS — D631 Anemia in chronic kidney disease: Secondary | ICD-10-CM | POA: Diagnosis not present

## 2021-01-19 DIAGNOSIS — C50211 Malignant neoplasm of upper-inner quadrant of right female breast: Secondary | ICD-10-CM | POA: Diagnosis not present

## 2021-01-19 DIAGNOSIS — E1129 Type 2 diabetes mellitus with other diabetic kidney complication: Secondary | ICD-10-CM | POA: Diagnosis not present

## 2021-01-19 LAB — C DIFFICILE QUICK SCREEN W PCR REFLEX
C Diff antigen: POSITIVE — AB
C Diff interpretation: DETECTED
C Diff toxin: POSITIVE — AB

## 2021-01-19 LAB — COMPREHENSIVE METABOLIC PANEL
ALT: 22 U/L (ref 0–44)
AST: 14 U/L — ABNORMAL LOW (ref 15–41)
Albumin: 4 g/dL (ref 3.5–5.0)
Alkaline Phosphatase: 109 U/L (ref 38–126)
Anion gap: 13 (ref 5–15)
BUN: 45 mg/dL — ABNORMAL HIGH (ref 6–20)
CO2: 26 mmol/L (ref 22–32)
Calcium: 9.6 mg/dL (ref 8.9–10.3)
Chloride: 95 mmol/L — ABNORMAL LOW (ref 98–111)
Creatinine, Ser: 9.66 mg/dL — ABNORMAL HIGH (ref 0.44–1.00)
GFR, Estimated: 4 mL/min — ABNORMAL LOW (ref >60)
Glucose, Bld: 99 mg/dL (ref 70–99)
Potassium: 3.6 mmol/L (ref 3.5–5.1)
Sodium: 134 mmol/L — ABNORMAL LOW (ref 135–145)
Total Bilirubin: 0.8 mg/dL (ref 0.3–1.2)
Total Protein: 8.3 g/dL — ABNORMAL HIGH (ref 6.5–8.1)

## 2021-01-19 LAB — CBC
HCT: 36.9 % (ref 36.0–46.0)
Hemoglobin: 11.5 g/dL — ABNORMAL LOW (ref 12.0–15.0)
MCH: 33.5 pg (ref 26.0–34.0)
MCHC: 31.2 g/dL (ref 30.0–36.0)
MCV: 107.6 fL — ABNORMAL HIGH (ref 80.0–100.0)
Platelets: 234 10*3/uL (ref 150–400)
RBC: 3.43 MIL/uL — ABNORMAL LOW (ref 3.87–5.11)
RDW: 17 % — ABNORMAL HIGH (ref 11.5–15.5)
WBC: 11.2 10*3/uL — ABNORMAL HIGH (ref 4.0–10.5)
nRBC: 0 % (ref 0.0–0.2)

## 2021-01-19 MED ORDER — FIDAXOMICIN 200 MG PO TABS: 200.0000 mg | ORAL_TABLET | Freq: Two times a day (BID) | ORAL | 0 refills | Status: DC

## 2021-01-19 MED ORDER — FIDAXOMICIN 200 MG PO TABS
200.0000 mg | ORAL_TABLET | Freq: Once | ORAL | Status: AC
  Administered 2021-01-19: 200 mg via ORAL
  Filled 2021-01-19: qty 1

## 2021-01-19 NOTE — ED Notes (Signed)
Patient aware of need for specimens. States that she makes little to no urine.

## 2021-01-19 NOTE — ED Triage Notes (Signed)
Pt to er, pt's doctor called states that pt was recently getting treatment for c diff, states that she had a sample Friday that has come back negative.  Pt states that she had dialysis today but couldn't finish her run, states that she is still having the diarrhea and feels weak and dizzy.

## 2021-01-19 NOTE — Discharge Instructions (Addendum)
Follow-up with your family doctor next week for recheck. 

## 2021-01-19 NOTE — ED Notes (Signed)
Date and time results received: 01/19/21 1950  Test: c diff Critical Value: positive antigen and toxin  Name of Provider Notified: Dr. Roderic Palau  Orders Received? Or Actions Taken?: n/a

## 2021-01-19 NOTE — ED Provider Notes (Signed)
[facility] EMERGENCY DEPARTMENT Provider Note   CSN: [id number] Arrival date & time: 01/19/21  1312     History Chief Complaint  Patient presents with  . Weakness    Megan [name] is a 60 y.o. female.  Patient has history of C. difficile.  She is taking 10 days worth of vancomycin and did well up until yesterday.  She was 10 days off the medicine.  She started back with the diarrhea and weakness.  The history is provided by the patient and medical records. No language interpreter was used.  Weakness Severity:  Moderate Onset quality:  Sudden Timing:  Constant Progression:  Waxing and waning Chronicity:  New Context: not alcohol use   Relieved by:  Nothing Associated symptoms: diarrhea   Associated symptoms: no abdominal pain, no chest pain, no cough, no frequency, no headaches and no seizures        Past Medical History:  Diagnosis Date  . (HFpEF) heart failure with preserved ejection fraction ([place])    a. 01/2019 Echo: EF 55-60%, mild conc LVH. DD.  Torn MV chordae.  . Anemia   . Atypical chest pain    a. 08/2018 MV: EF 59%, no ischemia; b. 02/2019 Cath: nonobs dzs.  . Blood transfusion without reported diagnosis   . Breast cancer ([place]) 10/12/2020  . Cataract   . ESRD (end stage renal disease) on dialysis ([place])    a. HD T, T, S  . Essential hypertension, benign   . GERD (gastroesophageal reflux disease)   . Headache   . Hemorrhoids   . Mixed hyperlipidemia   . Morbid obesity ([place])   . Non-obstructive CAD (coronary artery disease)    a. 02/2019 CathL LM nl, LAD 43m, LCX nl, RCA 25p, EF 55-65%.  Marland Kitchen PONV (postoperative nausea and vomiting)   . S/P colonoscopy Jan 2011   Dr. Benson Norway: sessile polyp (benign lymphoid), large hemorrhoids, repeat 5-10 years  . Temporal arteritis ([place])   . Type 2 diabetes mellitus ([place])   . Wears glasses     Patient Active Problem List   Diagnosis Date Noted  . Breast mass, right   . History of right breast cancer   . Lobar  pneumonia, unspecified organism ([place]) 07/03/2020  . Pleural effusion, right   . S/P left mastectomy 06/15/2020  . Allergy, unspecified, initial encounter 03/03/2020  . Anaphylactic shock, unspecified, initial encounter 03/03/2020  . Breast cancer ([place]) 02/18/2020  . History of modified radical mastectomy of right breast 02/18/2020  . S/P mastectomy, right 02/18/2020  . ESRD (end stage renal disease) on dialysis ([place])   . Grade III hemorrhoids 07/31/2019  . Fluid overload, unspecified 05/24/2019  . Anaphylactic reaction due to adverse effect of correct drug or medicament properly administered, initial encounter 04/30/2019  . Unspecified protein-calorie malnutrition ([place]) 03/03/2019  . Hypertensive heart disease with heart failure ([place]) 03/01/2019  . Atherosclerotic heart disease of native coronary artery without angina pectoris 03/01/2019  . Chest pain 02/28/2019  . Chest pain with high risk for cardiac etiology 02/28/2019  . Elevated troponin 02/03/2019  . ESRD (end stage renal disease) ([place]) 02/03/2019  . Acute diastolic CHF (congestive heart failure) ([place]) 02/03/2019  . Hypokalemia 02/03/2019  . Acute respiratory failure with hypoxia ([place]) 02/03/2019  . 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[place])   . Hypocalcemia 09/03/2018  . Hypothyroidism, unspecified 09/21/2017  . Anemia in chronic kidney disease 04/12/2017  . Iron deficiency anemia, unspecified 04/12/2017  . Abnormal results of  kidney function studies 04/11/2017  . Acidosis 04/11/2017  . Anemia, unspecified 04/11/2017  . Coagulation defect, unspecified ([place]) 04/11/2017  . 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, with stage 5 chronic kidney disease, or end stage renal disease ([place]) 04/11/2017  . 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 Other disorders of phosphorus metabolism 04/11/2017  . Pain, unspecified 04/11/2017  . Proteinuria,  unspecified 04/11/2017  . Pruritus, unspecified 04/11/2017  . Unspecified kidney failure 04/11/2017  . Encounter for prophylactic removal of breast 04/04/2017  . Rectal bleeding 06/08/2016  . Vaginal discharge 12/30/2015  . Abdominal pain, epigastric 11/26/2013  . GERD (gastroesophageal reflux disease)   . Type 2 diabetes mellitus ([place])   . CKD (chronic kidney disease) stage 3, GFR 30-59 ml/min ([facility])   . External hemorrhoid 08/01/2011  . Hematochezia 07/13/2011  . Dyspnea 04/21/2010  . Atypical chest pain 04/21/2010  . Mixed hyperlipidemia 03/11/2008  . OBESITY 03/11/2008  . Essential hypertension, benign 03/11/2008    Past Surgical History:  Procedure Laterality Date  . ABDOMINAL HYSTERECTOMY    . APPENDECTOMY    . ARTERY BIOPSY N/A 05/09/2018   Procedure: RIGHT TEMPORAL ARTERY BIOPSY;  Surgeon: Judeth Horn, MD;  Location: [facility];  Service: General;  Laterality: N/A;  . BREAST BIOPSY Right 06/15/2020   Procedure: RIGHT BREAST BIOPSY;  Surgeon: Aviva Signs, MD;  Location: [facility];  Service: General;  Laterality: Right;  . CATARACT EXTRACTION W/PHACO Left 02/09/2017   Procedure: CATARACT EXTRACTION PHACO AND INTRAOCULAR LENS PLACEMENT LEFT EYE;  Surgeon: Tonny Branch, MD;  Location: [facility];  Service: Ophthalmology;  Laterality: Left;  CDE: 4.89  . CATARACT EXTRACTION W/PHACO Right 06/04/2017   Procedure: CATARACT EXTRACTION PHACO AND INTRAOCULAR LENS PLACEMENT (IOC);  Surgeon: Tonny Branch, MD;  Location: [facility];  Service: Ophthalmology;  Laterality: Right;  CDE: 4.12  . CHOLECYSTECTOMY  09/29/2011   Procedure: LAPAROSCOPIC CHOLECYSTECTOMY;  Surgeon: Jamesetta So, MD;  Location: [facility];  Service: General;  Laterality: N/A;  . COLONOSCOPY  Jan 2011   Dr. Benson Norway: sessile polyp (benign lymphoid), large hemorrhoids, repeat 5-10 years  . COLONOSCOPY N/A 06/12/2016   Procedure: COLONOSCOPY;  Surgeon: Daneil Dolin, MD;  [facility];  Service: Endoscopy;  Laterality: N/A;  1230    . ESOPHAGOGASTRODUODENOSCOPY  09/05/2011   ZOX:[id number] hiatal hernia; remainder of exam normal. No explanation for patient's abdominal pain with today's examination  . ESOPHAGOGASTRODUODENOSCOPY N/A 12/17/2013   Dr. [name]: gastric erythema, erosion, mild chronic inflammation on path   . EXCISION OF BREAST BIOPSY Right 10/12/2020   Procedure: EXCISION OF RIGHT BREAST BIOPSY;  Surgeon: Aviva Signs, MD;  Location: [facility];  Service: General;  Laterality: Right;  . LAPAROSCOPIC APPENDECTOMY  09/29/2011   Procedure: APPENDECTOMY LAPAROSCOPIC;  Surgeon: Jamesetta So, MD;  Location: [facility];  Service: General;;  incidental appendectomy  . LEFT HEART CATH AND CORONARY ANGIOGRAPHY N/A 02/28/2019   Procedure: LEFT HEART CATH AND CORONARY ANGIOGRAPHY;  Surgeon: Jettie Booze, MD;  Location: [facility];  Service: Cardiovascular;  Laterality: N/A;  . MASTECTOMY MODIFIED RADICAL Right 02/18/2020   Procedure: MASTECTOMY MODIFIED RADICAL;  Surgeon: Aviva Signs, MD;  Location: [facility];  Service: General;  Laterality: Right;  . MASTECTOMY, PARTIAL Right 07/13/2020   Procedure: RIGHT PARTIAL MASTECTOMY;  Surgeon: Aviva Signs, MD;  Location: [facility];  Service: General;  Laterality: Right;  . PARTIAL MASTECTOMY WITH NEEDLE LOCALIZATION AND AXILLARY SENTINEL LYMPH NODE BX  Right 09/18/2018   Procedure: RIGHT PARTIAL MASTECTOMY AFTER NEEDLE LOCALIZATION, SENTINEL LYMPH NODE BIOPSY RIGHT AXILLA;  Surgeon: Aviva Signs, MD;  Location: [facility];  Service: General;  Laterality: Right;  . SIMPLE MASTECTOMY WITH AXILLARY SENTINEL NODE BIOPSY Left 06/15/2020   Procedure: LEFT SIMPLE MASTECTOMY;  Surgeon: Aviva Signs, MD;  Location: [facility];  Service: General;  Laterality: Left;     OB History    Gravida      Para      Term      Preterm      AB      Living  1     SAB      IAB      Ectopic      Multiple      Live Births              Family History  Problem Relation Age of Onset  .  Hypertension Mother   . Coronary artery disease Mother   . Diabetes Mother   . Hypertension Sister   . Coronary artery disease Sister   . Hypertension Brother   . Heart attack Father   . Hypertension Son   . Heart attack Maternal Aunt   . Hypertension Maternal Aunt   . Diabetes Maternal Aunt   . Heart attack Maternal Uncle   . Hypertension Maternal Uncle   . Diabetes Maternal Uncle   . Heart attack Paternal Aunt   . Hypertension Paternal Aunt   . Diabetes Paternal Aunt   . Heart attack Paternal Uncle   . Hypertension Paternal Uncle   . Diabetes Paternal Uncle   . Heart attack Maternal Grandmother   . Heart attack Maternal Grandfather   . Heart attack Paternal Grandmother   . Heart attack Paternal Grandfather   . Colon cancer Neg Hx     Social History   Tobacco Use  . Smoking status: Never Smoker  . Smokeless tobacco: Never Used  Vaping Use  . Vaping Use: Never used  Substance Use Topics  . Alcohol use: No  . Drug use: No    Home Medications Prior to Admission medications   Medication Sig Start Date End Date Taking? Authorizing Provider  amLODipine (NORVASC) 10 MG tablet TAKE 1 TABLET BY MOUTH AT BEDTIME 11/25/20  Yes [name], MD  anastrozole (ARIMIDEX) 1 MG tablet TAKE 1 TABLET(1 MG) BY MOUTH DAILY 10/15/20  Yes Derek Jack, MD  aspirin EC 81 MG tablet Take 81 mg by mouth daily.   Yes [provider]  atorvastatin (LIPITOR) 20 MG tablet Take 1 tablet (20 mg total) by mouth daily. Patient taking differently: Take 20 mg by mouth at bedtime. 06/29/20  Yes [name], MD  B Complex-C-Folic Acid (DIALYVITE 734) 0.8 MG TABS Take 1 tablet by mouth daily. 01/22/18  Yes [provider]  cinacalcet (SENSIPAR) 30 MG tablet Take 2 tablets (60 mg total) by mouth daily. Patient taking differently: Take 30 mg by mouth daily with supper. 06/13/18  Yes [name], MD  Darbepoetin Alfa (ARANESP, ALBUMIN FREE, IJ) Darbepoetin Alfa  (Aranesp) 10/22/20 10/21/21 Yes [provider]  Emollient (AQUAPHOR EX) Apply 1 application topically daily as needed (burn).   Yes [provider]  fidaxomicin (DIFICID) 200 MG TABS tablet Take 1 tablet (200 mg total) by mouth 2 (two) times daily. 01/19/21  Yes Milton Ferguson, MD  gabapentin (NEURONTIN) 300 MG capsule TAKE 1 CAPSULE(300 MG) BY MOUTH AT BEDTIME Patient taking differently: Take 300  mg by mouth at bedtime. 12/13/20  Yes [name], MD  HYDROcodone-acetaminophen (NORCO) 5-325 MG tablet Take 1 tablet by mouth every 6 (six) hours as needed for moderate pain. 10/12/20  Yes Aviva Signs, MD  lanthanum (FOSRENOL) 1000 MG chewable tablet Chew 2,000-3,000 mg by mouth See admin instructions. Take 3 tablets (3000 mg) by mouth with meals and take 2 tablets (2000 mg) with snacks 10/16/19  Yes [provider]  lidocaine-prilocaine (EMLA) cream Apply 1 application topically every Monday, Wednesday, and Friday with hemodialysis.  09/24/18  Yes [provider]  losartan (COZAAR) 100 MG tablet Take 1 tablet by mouth daily. 02/23/17  Yes [provider]  nebivolol (BYSTOLIC) 10 MG tablet Take 1 tablet (10 mg total) by mouth in the morning and at bedtime. 05/13/20  Yes [name], MD  cholestyramine Lucrezia Starch) 4 g packet Take 1 packet (4 g total) by mouth daily with lunch. Patient not taking: No sig reported 12/31/20   Noemi Chapel A, NP  DULoxetine (CYMBALTA) 30 MG capsule TAKE 1 CAPSULE(30 MG) BY MOUTH DAILY Patient not taking: No sig reported 08/05/20   [name], MD  hydrALAZINE (APRESOLINE) 25 MG tablet Take by mouth. Patient not taking: No sig reported 06/26/16   [provider]  pantoprazole (PROTONIX) 40 MG tablet TAKE 1 TABLET(40 MG) BY MOUTH DAILY Patient not taking: No sig reported 11/25/20   [name], MD  vancomycin [facility]) 125 MG capsule  01/01/21   [provider]    Allergies    Patient has no  known allergies.  Review of Systems   Review of Systems  Constitutional: Negative for appetite change and fatigue.  HENT: Negative for congestion, ear discharge and sinus pressure.   Eyes: Negative for discharge.  Respiratory: Negative for cough.   Cardiovascular: Negative for chest pain.  Gastrointestinal: Positive for diarrhea. Negative for abdominal pain.  Genitourinary: Negative for frequency and hematuria.  Musculoskeletal: Negative for back pain.  Skin: Negative for rash.  Neurological: Positive for weakness. Negative for seizures and headaches.  Psychiatric/Behavioral: Negative for hallucinations.    Physical Exam Updated Vital Signs BP (!) 158/80   Pulse (!) 104   Temp 99.3 F (37.4 C) (Oral)   Resp (!) 21   Ht 5\' 2"  (1.575 m)   Wt 74.4 kg   SpO2 93%   BMI 30.00 kg/m   Physical Exam Vitals and nursing note reviewed.  Constitutional:      Appearance: She is well-developed.  HENT:     Head: Normocephalic.     Nose: Nose normal.  Eyes:     General: No scleral icterus.    Conjunctiva/sclera: Conjunctivae normal.  Neck:     Thyroid: No thyromegaly.  Cardiovascular:     Rate and Rhythm: Normal rate and regular rhythm.     Heart sounds: No murmur heard. No friction rub. No gallop.   Pulmonary:     Breath sounds: No stridor. No wheezing or rales.  Chest:     Chest wall: No tenderness.  Abdominal:     General: There is no distension.     Tenderness: There is no abdominal tenderness. There is no rebound.  Musculoskeletal:        General: Normal range of motion.     Cervical back: Neck supple.  Lymphadenopathy:     Cervical: No cervical adenopathy.  Skin:    Findings: No erythema or rash.  Neurological:     Mental Status: She is alert  and oriented to person, place, and time.     Motor: No abnormal muscle tone.     Coordination: Coordination normal.  Psychiatric:        Behavior: Behavior normal.     ED Results / Procedures / Treatments   Labs (all  labs ordered are listed, but only abnormal results are displayed) Labs Reviewed  C DIFFICILE QUICK SCREEN W PCR REFLEX - Abnormal; Notable for the following components:      Result Value   C Diff antigen POSITIVE (*)    C Diff toxin POSITIVE (*)    All other components within normal limits  CBC - Abnormal; Notable for the following components:   WBC 11.2 (*)    RBC 3.43 (*)    Hemoglobin 11.5 (*)    MCV 107.6 (*)    RDW 17.0 (*)    All other components within normal limits  COMPREHENSIVE METABOLIC PANEL - Abnormal; Notable for the following components:   Sodium 134 (*)    Chloride 95 (*)    BUN 45 (*)    Creatinine, Ser 9.66 (*)    Total Protein 8.3 (*)    AST 14 (*)    GFR, Estimated 4 (*)    All other components within normal limits  GASTROINTESTINAL PANEL BY PCR, STOOL (REPLACES STOOL CULTURE)  URINALYSIS, ROUTINE W REFLEX MICROSCOPIC    EKG EKG Interpretation  Date/Time:  Wednesday January 19 2021 13:22:23 EDT Ventricular Rate:  106 PR Interval:  166 QRS Duration: 86 QT Interval:  362 QTC Calculation: 480 R Axis:   33 Text Interpretation: Sinus tachycardia Possible Left atrial enlargement Left ventricular hypertrophy ( R in aVL , Romhilt-Estes ) ST & T wave abnormality, consider lateral ischemia Abnormal ECG Confirmed by Noemi Chapel [phone]) on 01/19/2021 1:46:35 PM   Radiology No results found.  Procedures Procedures   Medications Ordered in ED Medications  fidaxomicin (DIFICID) tablet 200 mg (has no administration in time range)    ED Course  I have reviewed the triage vital signs and the nursing notes.  Pertinent labs & imaging results that were available during my care of the patient were reviewed by me and considered in my medical decision making (see chart for details).   Patient was positive for C. difficile.  I spoke with gastroenterologist Dr. Jenetta Downer and he recommended putting the patient on fidaxomicin.  So she was placed on a 10-day course of  this, follow-up with PCP MDM Rules/Calculators/A&P                          Patient with recurrent of C. difficile.  She is started on Pradaxa Meissen Final Clinical Impression(s) / ED Diagnoses Final diagnoses:  Clostridioides difficile diarrhea    Rx / DC Orders ED Discharge Orders         Ordered    fidaxomicin (DIFICID) 200 MG TABS tablet  2 times daily        01/19/21 2030           Milton Ferguson, MD 01/19/21 2037

## 2021-01-19 NOTE — ED Provider Notes (Signed)
Emergency Medicine Provider Triage Evaluation Note  Megan [name] , a 60 y.o. female  was evaluated in triage.  Pt complains of recently getting treatment for c.diff.  She finished treatment and had a negative test. She went to dialysis and could not finish due to feeling light headed and having to have diarrhea.  She got about half.  The diarrhea restarted yesterday. She was at radiation today and they sent her here due to "low grade fever" and high heart rate.   Review of Systems  Positive: Light headed, abdominal pain Negative: Vomiting  Physical Exam  BP 122/71 (BP Location: Right Arm)   Pulse (!) 109   Temp 99.3 F (37.4 C) (Oral)   Resp 20   Ht 5\' 2"  (1.575 m)   Wt 74.4 kg   SpO2 96%   BMI 30.00 kg/m  Gen:   Awake, no distress   Resp:  Normal effort  MSK:   Moves extremities without difficulty  Other:  Tachycardia.    Medical Decision Making  Medically screening exam initiated at 1:28 PM.  Appropriate orders placed.  Megan [name] was informed that the remainder of the evaluation will be completed by another provider, this initial triage assessment does not replace that evaluation, and the importance of remaining in the ED until their evaluation is complete.     Lorin Glass, PA-C 01/19/21 1331    Noemi Chapel, MD 01/21/21 [phone]

## 2021-01-20 LAB — GASTROINTESTINAL PANEL BY PCR, STOOL (REPLACES STOOL CULTURE)

## 2021-01-21 DIAGNOSIS — N2581 Secondary hyperparathyroidism of renal origin: Secondary | ICD-10-CM | POA: Diagnosis not present

## 2021-01-21 DIAGNOSIS — E1129 Type 2 diabetes mellitus with other diabetic kidney complication: Secondary | ICD-10-CM | POA: Diagnosis not present

## 2021-01-21 DIAGNOSIS — N186 End stage renal disease: Secondary | ICD-10-CM | POA: Diagnosis not present

## 2021-01-21 DIAGNOSIS — Z992 Dependence on renal dialysis: Secondary | ICD-10-CM | POA: Diagnosis not present

## 2021-01-21 DIAGNOSIS — D631 Anemia in chronic kidney disease: Secondary | ICD-10-CM | POA: Diagnosis not present

## 2021-01-24 DIAGNOSIS — C50211 Malignant neoplasm of upper-inner quadrant of right female breast: Secondary | ICD-10-CM | POA: Diagnosis not present

## 2021-01-25 DIAGNOSIS — Z992 Dependence on renal dialysis: Secondary | ICD-10-CM | POA: Diagnosis not present

## 2021-01-25 DIAGNOSIS — N2581 Secondary hyperparathyroidism of renal origin: Secondary | ICD-10-CM | POA: Diagnosis not present

## 2021-01-25 DIAGNOSIS — N186 End stage renal disease: Secondary | ICD-10-CM | POA: Diagnosis not present

## 2021-01-25 DIAGNOSIS — C50211 Malignant neoplasm of upper-inner quadrant of right female breast: Secondary | ICD-10-CM | POA: Diagnosis not present

## 2021-01-25 DIAGNOSIS — E1129 Type 2 diabetes mellitus with other diabetic kidney complication: Secondary | ICD-10-CM | POA: Diagnosis not present

## 2021-01-25 DIAGNOSIS — D631 Anemia in chronic kidney disease: Secondary | ICD-10-CM | POA: Diagnosis not present

## 2021-01-26 DIAGNOSIS — N2581 Secondary hyperparathyroidism of renal origin: Secondary | ICD-10-CM | POA: Diagnosis not present

## 2021-01-26 DIAGNOSIS — N186 End stage renal disease: Secondary | ICD-10-CM | POA: Diagnosis not present

## 2021-01-26 DIAGNOSIS — E1129 Type 2 diabetes mellitus with other diabetic kidney complication: Secondary | ICD-10-CM | POA: Diagnosis not present

## 2021-01-26 DIAGNOSIS — D631 Anemia in chronic kidney disease: Secondary | ICD-10-CM | POA: Diagnosis not present

## 2021-01-26 DIAGNOSIS — Z992 Dependence on renal dialysis: Secondary | ICD-10-CM | POA: Diagnosis not present

## 2021-01-26 DIAGNOSIS — C50211 Malignant neoplasm of upper-inner quadrant of right female breast: Secondary | ICD-10-CM | POA: Diagnosis not present

## 2021-01-27 ENCOUNTER — Encounter: Payer: Self-pay | Admitting: Family Medicine

## 2021-01-27 ENCOUNTER — Other Ambulatory Visit: Payer: Self-pay

## 2021-01-27 ENCOUNTER — Ambulatory Visit (INDEPENDENT_AMBULATORY_CARE_PROVIDER_SITE_OTHER): Payer: Medicare Other | Admitting: Family Medicine

## 2021-01-27 VITALS — BP 120/66 | HR 86 | Temp 97.9°F | Resp 14 | Ht 62.0 in | Wt 164.0 lb

## 2021-01-27 DIAGNOSIS — C50811 Malignant neoplasm of overlapping sites of right female breast: Secondary | ICD-10-CM | POA: Diagnosis not present

## 2021-01-27 DIAGNOSIS — C50211 Malignant neoplasm of upper-inner quadrant of right female breast: Secondary | ICD-10-CM | POA: Diagnosis not present

## 2021-01-27 DIAGNOSIS — A0472 Enterocolitis due to Clostridium difficile, not specified as recurrent: Secondary | ICD-10-CM

## 2021-01-27 MED ORDER — SACCHAROMYCES BOULARDII 250 MG PO CAPS: 250.0000 mg | ORAL_CAPSULE | Freq: Two times a day (BID) | ORAL | 1 refills | Status: DC

## 2021-01-27 NOTE — Progress Notes (Signed)
Subjective:    Patient ID: Megan [name], female    DOB: [birth year], 60 y.o.   MRN: [id number] Patient was diagnosed with C. difficile colitis in May.  She completed a 10-day course of 4 times daily vancomycin.  Follow-up stool studies were negative for C. difficile.  However 1 week after finishing the antibiotics, she developed fevers and chills and diarrhea.  She was seen in the emergency room and tested positive for C. difficile once again.  In the emergency room, she was started on fidaxomicin 500 mg twice daily for 10 days.  She states that she feels much better now.  She denies any diarrhea.  Abdominal distention is improving. Past Medical History:  Diagnosis Date   (HFpEF) heart failure with preserved ejection fraction ([place])    a. 01/2019 Echo: EF 55-60%, mild conc LVH. DD.  Torn MV chordae.   Anemia    Atypical chest pain    a. 08/2018 MV: EF 59%, no ischemia; b. 02/2019 Cath: nonobs dzs.   Blood transfusion without reported diagnosis    Breast cancer ([place]) 10/12/2020   Cataract    ESRD (end stage renal disease) on dialysis [facility])    a. HD T, T, S   Essential hypertension, benign    GERD (gastroesophageal reflux disease)    Headache    Hemorrhoids    Mixed hyperlipidemia    Morbid obesity (HCC)    Non-obstructive CAD (coronary artery disease)    a. 02/2019 CathL LM nl, LAD 31m, LCX nl, RCA 25p, EF 55-65%.   PONV (postoperative nausea and vomiting)    S/P colonoscopy Jan 2011   Dr. Benson Norway: sessile polyp (benign lymphoid), large hemorrhoids, repeat 5-10 years   Temporal arteritis ([place])    Type 2 diabetes mellitus ([place])    Wears glasses    Past Surgical History:  Procedure Laterality Date   ABDOMINAL HYSTERECTOMY     APPENDECTOMY     ARTERY BIOPSY N/A 05/09/2018   Procedure: RIGHT TEMPORAL ARTERY BIOPSY;  Surgeon: Judeth Horn, MD;  Location: [place];  Service: General;  Laterality: N/A;   BREAST BIOPSY Right 06/15/2020   Procedure: RIGHT BREAST BIOPSY;  Surgeon: Aviva Signs, MD;  [facility];  Service: General;  Laterality: Right;   CATARACT EXTRACTION W/PHACO Left 02/09/2017   Procedure: CATARACT EXTRACTION PHACO AND INTRAOCULAR LENS PLACEMENT LEFT EYE;  Surgeon: Tonny Branch, MD;  Location: [facility];  Service: Ophthalmology;  Laterality: Left;  CDE: 4.89   CATARACT EXTRACTION W/PHACO Right 06/04/2017   Procedure: CATARACT EXTRACTION PHACO AND INTRAOCULAR LENS PLACEMENT (IOC);  Surgeon: Tonny Branch, MD;  [facility]: [facility];  Service: Ophthalmology;  Laterality: Right;  CDE: 4.12   CHOLECYSTECTOMY  09/29/2011   Procedure: LAPAROSCOPIC CHOLECYSTECTOMY;  Surgeon: Jamesetta So, MD;  [facility]: [facility];  Service: General;  Laterality: N/A;   COLONOSCOPY  Jan 2011   Dr. Benson Norway: sessile polyp (benign lymphoid), large hemorrhoids, repeat 5-10 years   COLONOSCOPY N/A 06/12/2016   Procedure: COLONOSCOPY;  Surgeon: Daneil Dolin, MD;  [facility];  Service: Endoscopy;  Laterality: N/A;  1230    ESOPHAGOGASTRODUODENOSCOPY  09/05/2011   CXK:GYJEH hiatal hernia; remainder of exam normal. No explanation for patient's abdominal pain with today's examination   ESOPHAGOGASTRODUODENOSCOPY N/A 12/17/2013   Dr. [name]: gastric erythema, erosion, mild chronic inflammation on path    EXCISION OF BREAST BIOPSY Right 10/12/2020   Procedure: EXCISION OF RIGHT BREAST BIOPSY;  Surgeon: Aviva Signs, MD;  [facility];  Service: General;  Laterality: Right;   LAPAROSCOPIC APPENDECTOMY  09/29/2011   Procedure: APPENDECTOMY LAPAROSCOPIC;  Surgeon: Jamesetta So, MD;  [facility]: [facility];  Service: General;;  incidental appendectomy   LEFT HEART CATH AND CORONARY ANGIOGRAPHY N/A 02/28/2019   Procedure: LEFT HEART CATH AND CORONARY ANGIOGRAPHY;  Surgeon: Jettie Booze, MD;  Location: [place] [facility];  Service: Cardiovascular;  Laterality: N/A;   MASTECTOMY MODIFIED RADICAL Right 02/18/2020   Procedure: MASTECTOMY MODIFIED RADICAL;  Surgeon: Aviva Signs, MD;  [facility];   Service: General;  Laterality: Right;   MASTECTOMY, PARTIAL Right 07/13/2020   Procedure: RIGHT PARTIAL MASTECTOMY;  Surgeon: Aviva Signs, MD;  [facility];  Service: General;  Laterality: Right;   PARTIAL MASTECTOMY WITH NEEDLE LOCALIZATION AND AXILLARY SENTINEL LYMPH NODE BX Right 09/18/2018   Procedure: RIGHT PARTIAL MASTECTOMY AFTER NEEDLE LOCALIZATION, SENTINEL LYMPH NODE BIOPSY RIGHT AXILLA;  Surgeon: Aviva Signs, MD;  [facility];  Service: General;  Laterality: Right;   SIMPLE MASTECTOMY WITH AXILLARY SENTINEL NODE BIOPSY Left 06/15/2020   Procedure: LEFT SIMPLE MASTECTOMY;  Surgeon: Aviva Signs, MD;  [facility];  Service: General;  Laterality: Left;   Current Outpatient Medications on File Prior to Visit  Medication Sig Dispense Refill   amLODipine (NORVASC) 10 MG tablet TAKE 1 TABLET BY MOUTH AT BEDTIME 90 tablet 3   anastrozole (ARIMIDEX) 1 MG tablet TAKE 1 TABLET(1 MG) BY MOUTH DAILY 30 tablet 4   aspirin EC 81 MG tablet Take 81 mg by mouth daily.     atorvastatin (LIPITOR) 20 MG tablet Take 1 tablet (20 mg total) by mouth daily. (Patient taking differently: Take 20 mg by mouth at bedtime.) 90 tablet 3   B Complex-C-Folic Acid (DIALYVITE 878) 0.8 MG TABS Take 1 tablet by mouth daily.     cholestyramine (QUESTRAN) 4 g packet Take 1 packet (4 g total) by mouth daily with lunch. 60 each 1   cinacalcet (SENSIPAR) 30 MG tablet Take 2 tablets (60 mg total) by mouth daily. (Patient taking differently: Take 30 mg by mouth daily with supper.) 90 tablet 0   Darbepoetin Alfa (ARANESP, ALBUMIN FREE, IJ) Darbepoetin Alfa (Aranesp)     DULoxetine (CYMBALTA) 30 MG capsule TAKE 1 CAPSULE(30 MG) BY MOUTH DAILY 90 capsule 1   Emollient (AQUAPHOR EX) Apply 1 application topically daily as needed (burn).     fidaxomicin (DIFICID) 200 MG TABS tablet Take 1 tablet (200 mg total) by mouth 2 (two) times daily. 20 tablet 0   gabapentin (NEURONTIN) 300 MG capsule TAKE 1 CAPSULE(300 MG) BY  MOUTH AT BEDTIME (Patient taking differently: Take 300 mg by mouth at bedtime.) 90 capsule 1   hydrALAZINE (APRESOLINE) 25 MG tablet Take by mouth.     HYDROcodone-acetaminophen (NORCO) 5-325 MG tablet Take 1 tablet by mouth every 6 (six) hours as needed for moderate pain. 20 tablet 0   lanthanum (FOSRENOL) 1000 MG chewable tablet Chew 2,000-3,000 mg by mouth See admin instructions. Take 3 tablets (3000 mg) by mouth with meals and take 2 tablets (2000 mg) with snacks     lidocaine-prilocaine (EMLA) cream Apply 1 application topically every Monday, Wednesday, and Friday with hemodialysis.      losartan (COZAAR) 100 MG tablet Take 1 tablet by mouth daily.     nebivolol (BYSTOLIC) 10 MG tablet Take 1 tablet (10 mg total) by mouth in the morning and at bedtime. 180 tablet 3   pantoprazole (PROTONIX) 40 MG tablet TAKE 1 TABLET(40  MG) BY MOUTH DAILY 90 tablet 3   vancomycin (VANCOCIN) 125 MG capsule      No current facility-administered medications on file prior to visit.   No Known Allergies Social History   Socioeconomic History   Marital status: Married    Spouse name: Not on file   Number of children: Not on file   Years of education: Not on file   Highest education level: Not on file  Occupational History   Occupation: Merchandiser, retail: [id number]  Tobacco Use   Smoking status: Never   Smokeless tobacco: Never  Vaping Use   Vaping Use: Never used  Substance and Sexual Activity   Alcohol use: No   Drug use: No   Sexual activity: Yes    Birth control/protection: Surgical  Other Topics Concern   Not on file  Social History Narrative   Works at [employer] in [place].    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   Also has to get items from high shelves-causes achy pain in shoulder area      Married.   Children are grown, out of house.   Social Determinants of Health   Financial Resource Strain: Not on file  Food Insecurity: Not on file  Transportation  Needs: Not on file  Physical Activity: Not on file  Stress: Not on file  Social Connections: Not on file  Intimate Partner Violence: Not At Risk   Fear of Current or Ex-Partner: No   Emotionally Abused: No   Physically Abused: No   Sexually Abused: No    Past Medical History:  Diagnosis Date   (HFpEF) heart failure with preserved ejection fraction ([place])    a. 01/2019 Echo: EF 55-60%, mild conc LVH. DD.  Torn MV chordae.   Anemia    Atypical chest pain    a. 08/2018 MV: EF 59%, no ischemia; b. 02/2019 Cath: nonobs dzs.   Blood transfusion without reported diagnosis    Breast cancer ([place]) 10/12/2020   Cataract    ESRD (end stage renal disease) on dialysis [facility])    a. HD T, T, S   Essential hypertension, benign    GERD (gastroesophageal reflux disease)    Headache    Hemorrhoids    Mixed hyperlipidemia    Morbid obesity ([facility])    Non-obstructive CAD (coronary artery disease)    a. 02/2019 CathL LM nl, LAD 37m, LCX nl, RCA 25p, EF 55-65%.   PONV (postoperative nausea and vomiting)    S/P colonoscopy Jan 2011   Dr. Benson Norway: sessile polyp (benign lymphoid), large hemorrhoids, repeat 5-10 years   Temporal arteritis ([place])    Type 2 diabetes mellitus ([place])    Wears glasses    Past Surgical History:  Procedure Laterality Date   ABDOMINAL HYSTERECTOMY     APPENDECTOMY     ARTERY BIOPSY N/A 05/09/2018   Procedure: RIGHT TEMPORAL ARTERY BIOPSY;  Surgeon: Judeth Horn, MD;  Location: [place];  Service: General;  Laterality: N/A;   BREAST BIOPSY Right 06/15/2020   Procedure: RIGHT BREAST BIOPSY;  Surgeon: Aviva Signs, MD;  [facility];  Service: General;  Laterality: Right;   CATARACT EXTRACTION W/PHACO Left 02/09/2017   Procedure: CATARACT EXTRACTION PHACO AND INTRAOCULAR LENS PLACEMENT LEFT EYE;  Surgeon: Tonny Branch, MD;  [facility]: [facility];  Service: Ophthalmology;  Laterality: Left;  CDE: 4.89   CATARACT EXTRACTION W/PHACO Right 06/04/2017   Procedure: CATARACT EXTRACTION PHACO AND  INTRAOCULAR LENS PLACEMENT (IOC);  Surgeon: Tonny Branch, MD;  [facility]: [facility];  Service: Ophthalmology;  Laterality: Right;  CDE: 4.12   CHOLECYSTECTOMY  09/29/2011   Procedure: LAPAROSCOPIC CHOLECYSTECTOMY;  Surgeon: Jamesetta So, MD;  [facility];  Service: General;  Laterality: N/A;   COLONOSCOPY  Jan 2011   Dr. Benson Norway: sessile polyp (benign lymphoid), large hemorrhoids, repeat 5-10 years   COLONOSCOPY N/A 06/12/2016   Procedure: COLONOSCOPY;  Surgeon: Daneil Dolin, MD;  [facility];  Service: Endoscopy;  Laterality: N/A;  1230    ESOPHAGOGASTRODUODENOSCOPY  09/05/2011   TKW:[id number] hiatal hernia; remainder of exam normal. No explanation for patient's abdominal pain with today's examination   ESOPHAGOGASTRODUODENOSCOPY N/A 12/17/2013   Dr. [name]: gastric erythema, erosion, mild chronic inflammation on path    EXCISION OF BREAST BIOPSY Right 10/12/2020   Procedure: EXCISION OF RIGHT BREAST BIOPSY;  Surgeon: Aviva Signs, MD;  [facility];  Service: General;  Laterality: Right;   LAPAROSCOPIC APPENDECTOMY  09/29/2011   Procedure: APPENDECTOMY LAPAROSCOPIC;  Surgeon: Jamesetta So, MD;  Location: [facility];  Service: General;;  incidental appendectomy   LEFT HEART CATH AND CORONARY ANGIOGRAPHY N/A 02/28/2019   Procedure: LEFT HEART CATH AND CORONARY ANGIOGRAPHY;  Surgeon: Jettie Booze, MD;  Location: [place] [facility];  Service: Cardiovascular;  Laterality: N/A;   MASTECTOMY MODIFIED RADICAL Right 02/18/2020   Procedure: MASTECTOMY MODIFIED RADICAL;  Surgeon: Aviva Signs, MD;  Location: [facility];  Service: General;  Laterality: Right;   MASTECTOMY, PARTIAL Right 07/13/2020   Procedure: RIGHT PARTIAL MASTECTOMY;  Surgeon: Aviva Signs, MD;  Location: [facility];  Service: General;  Laterality: Right;   PARTIAL MASTECTOMY WITH NEEDLE LOCALIZATION AND AXILLARY SENTINEL LYMPH NODE BX Right 09/18/2018   Procedure: RIGHT PARTIAL MASTECTOMY AFTER NEEDLE LOCALIZATION, SENTINEL LYMPH  NODE BIOPSY RIGHT AXILLA;  Surgeon: Aviva Signs, MD;  Location: [facility];  Service: General;  Laterality: Right;   SIMPLE MASTECTOMY WITH AXILLARY SENTINEL NODE BIOPSY Left 06/15/2020   Procedure: LEFT SIMPLE MASTECTOMY;  Surgeon: Aviva Signs, MD;  Location: [facility];  Service: General;  Laterality: Left;   Current Outpatient Medications on File Prior to Visit  Medication Sig Dispense Refill   amLODipine (NORVASC) 10 MG tablet TAKE 1 TABLET BY MOUTH AT BEDTIME 90 tablet 3   anastrozole (ARIMIDEX) 1 MG tablet TAKE 1 TABLET(1 MG) BY MOUTH DAILY 30 tablet 4   aspirin EC 81 MG tablet Take 81 mg by mouth daily.     atorvastatin (LIPITOR) 20 MG tablet Take 1 tablet (20 mg total) by mouth daily. (Patient taking differently: Take 20 mg by mouth at bedtime.) 90 tablet 3   B Complex-C-Folic Acid (DIALYVITE 532) 0.8 MG TABS Take 1 tablet by mouth daily.     cholestyramine (QUESTRAN) 4 g packet Take 1 packet (4 g total) by mouth daily with lunch. 60 each 1   cinacalcet (SENSIPAR) 30 MG tablet Take 2 tablets (60 mg total) by mouth daily. (Patient taking differently: Take 30 mg by mouth daily with supper.) 90 tablet 0   Darbepoetin Alfa (ARANESP, ALBUMIN FREE, IJ) Darbepoetin Alfa (Aranesp)     DULoxetine (CYMBALTA) 30 MG capsule TAKE 1 CAPSULE(30 MG) BY MOUTH DAILY 90 capsule 1   Emollient (AQUAPHOR EX) Apply 1 application topically daily as needed (burn).     fidaxomicin (DIFICID) 200 MG TABS tablet Take 1 tablet (200 mg total) by mouth 2 (two) times daily. 20 tablet 0   gabapentin (NEURONTIN) 300  MG capsule TAKE 1 CAPSULE(300 MG) BY MOUTH AT BEDTIME (Patient taking differently: Take 300 mg by mouth at bedtime.) 90 capsule 1   hydrALAZINE (APRESOLINE) 25 MG tablet Take by mouth.     HYDROcodone-acetaminophen (NORCO) 5-325 MG tablet Take 1 tablet by mouth every 6 (six) hours as needed for moderate pain. 20 tablet 0   lanthanum (FOSRENOL) 1000 MG chewable tablet Chew 2,000-3,000 mg by mouth See admin  instructions. Take 3 tablets (3000 mg) by mouth with meals and take 2 tablets (2000 mg) with snacks     lidocaine-prilocaine (EMLA) cream Apply 1 application topically every Monday, Wednesday, and Friday with hemodialysis.      losartan (COZAAR) 100 MG tablet Take 1 tablet by mouth daily.     nebivolol (BYSTOLIC) 10 MG tablet Take 1 tablet (10 mg total) by mouth in the morning and at bedtime. 180 tablet 3   pantoprazole (PROTONIX) 40 MG tablet TAKE 1 TABLET(40 MG) BY MOUTH DAILY 90 tablet 3   vancomycin (VANCOCIN) 125 MG capsule      No current facility-administered medications on file prior to visit.   No Known Allergies Social History   Socioeconomic History   Marital status: Married    Spouse name: Not on file   Number of children: Not on file   Years of education: Not on file   Highest education level: Not on file  Occupational History   Occupation: Merchandiser, retail: [employer]  Tobacco Use   Smoking status: Never   Smokeless tobacco: Never  Vaping Use   Vaping Use: Never used  Substance and Sexual Activity   Alcohol use: No   Drug use: No   Sexual activity: Yes    Birth control/protection: Surgical  Other Topics Concern   Not on file  Social History Narrative   Works at [employer] in [place].    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   Also has to get items from high shelves-causes achy pain in shoulder area      Married.   Children are grown, out of house.   Social Determinants of Health   Financial Resource Strain: Not on file  Food Insecurity: Not on file  Transportation Needs: Not on file  Physical Activity: Not on file  Stress: Not on file  Social Connections: Not on file  Intimate Partner Violence: Not At Risk   Fear of Current or Ex-Partner: No   Emotionally Abused: No   Physically Abused: No   Sexually Abused: No      Review of Systems  All other systems reviewed and are negative.     Objective:   Physical  Exam Vitals reviewed.  Constitutional:      General: She is not in acute distress.    Appearance: Normal appearance. She is normal weight.  Cardiovascular:     Rate and Rhythm: Normal rate and regular rhythm.     Pulses: Normal pulses.     Heart sounds: Murmur (thrill from AV fistual heard as murmur) heard.  Pulmonary:     Effort: Pulmonary effort is normal. No respiratory distress.     Breath sounds: Normal breath sounds. No stridor. No wheezing, rhonchi or rales.  Chest:     Chest wall: No tenderness.  Musculoskeletal:     Right lower leg: No edema.     Left lower leg: No edema.  Neurological:     Mental Status: She is alert.  Assessment & Plan:  Clostridium difficile colitis Complete the fidaxomicin.  Add Florastor 250 mg p.o. twice daily for 1 month as a probiotic.  Recheck stool studies 1 week after completing antibiotic and then a week later to ensure resolution.

## 2021-01-28 DIAGNOSIS — N186 End stage renal disease: Secondary | ICD-10-CM | POA: Diagnosis not present

## 2021-01-28 DIAGNOSIS — E1129 Type 2 diabetes mellitus with other diabetic kidney complication: Secondary | ICD-10-CM | POA: Diagnosis not present

## 2021-01-28 DIAGNOSIS — Z992 Dependence on renal dialysis: Secondary | ICD-10-CM | POA: Diagnosis not present

## 2021-01-28 DIAGNOSIS — N2581 Secondary hyperparathyroidism of renal origin: Secondary | ICD-10-CM | POA: Diagnosis not present

## 2021-01-28 DIAGNOSIS — D631 Anemia in chronic kidney disease: Secondary | ICD-10-CM | POA: Diagnosis not present

## 2021-01-31 DIAGNOSIS — Z992 Dependence on renal dialysis: Secondary | ICD-10-CM | POA: Diagnosis not present

## 2021-01-31 DIAGNOSIS — D631 Anemia in chronic kidney disease: Secondary | ICD-10-CM | POA: Diagnosis not present

## 2021-01-31 DIAGNOSIS — N2581 Secondary hyperparathyroidism of renal origin: Secondary | ICD-10-CM | POA: Diagnosis not present

## 2021-01-31 DIAGNOSIS — N186 End stage renal disease: Secondary | ICD-10-CM | POA: Diagnosis not present

## 2021-01-31 DIAGNOSIS — E1129 Type 2 diabetes mellitus with other diabetic kidney complication: Secondary | ICD-10-CM | POA: Diagnosis not present

## 2021-02-02 DIAGNOSIS — Z992 Dependence on renal dialysis: Secondary | ICD-10-CM | POA: Diagnosis not present

## 2021-02-02 DIAGNOSIS — N2581 Secondary hyperparathyroidism of renal origin: Secondary | ICD-10-CM | POA: Diagnosis not present

## 2021-02-02 DIAGNOSIS — N186 End stage renal disease: Secondary | ICD-10-CM | POA: Diagnosis not present

## 2021-02-02 DIAGNOSIS — D631 Anemia in chronic kidney disease: Secondary | ICD-10-CM | POA: Diagnosis not present

## 2021-02-02 DIAGNOSIS — E1129 Type 2 diabetes mellitus with other diabetic kidney complication: Secondary | ICD-10-CM | POA: Diagnosis not present

## 2021-02-04 DIAGNOSIS — N2581 Secondary hyperparathyroidism of renal origin: Secondary | ICD-10-CM | POA: Diagnosis not present

## 2021-02-04 DIAGNOSIS — E1129 Type 2 diabetes mellitus with other diabetic kidney complication: Secondary | ICD-10-CM | POA: Diagnosis not present

## 2021-02-04 DIAGNOSIS — Z992 Dependence on renal dialysis: Secondary | ICD-10-CM | POA: Diagnosis not present

## 2021-02-04 DIAGNOSIS — D631 Anemia in chronic kidney disease: Secondary | ICD-10-CM | POA: Diagnosis not present

## 2021-02-04 DIAGNOSIS — N186 End stage renal disease: Secondary | ICD-10-CM | POA: Diagnosis not present

## 2021-02-06 ENCOUNTER — Other Ambulatory Visit: Payer: Self-pay | Admitting: Family Medicine

## 2021-02-07 DIAGNOSIS — E1129 Type 2 diabetes mellitus with other diabetic kidney complication: Secondary | ICD-10-CM | POA: Diagnosis not present

## 2021-02-07 DIAGNOSIS — D631 Anemia in chronic kidney disease: Secondary | ICD-10-CM | POA: Diagnosis not present

## 2021-02-07 DIAGNOSIS — N2581 Secondary hyperparathyroidism of renal origin: Secondary | ICD-10-CM | POA: Diagnosis not present

## 2021-02-07 DIAGNOSIS — Z992 Dependence on renal dialysis: Secondary | ICD-10-CM | POA: Diagnosis not present

## 2021-02-07 DIAGNOSIS — N186 End stage renal disease: Secondary | ICD-10-CM | POA: Diagnosis not present

## 2021-02-08 ENCOUNTER — Other Ambulatory Visit: Payer: Self-pay

## 2021-02-09 ENCOUNTER — Other Ambulatory Visit: Payer: Self-pay

## 2021-02-09 DIAGNOSIS — A0472 Enterocolitis due to Clostridium difficile, not specified as recurrent: Secondary | ICD-10-CM

## 2021-02-11 ENCOUNTER — Other Ambulatory Visit: Payer: Self-pay

## 2021-02-11 ENCOUNTER — Other Ambulatory Visit: Payer: Medicare Other

## 2021-02-11 DIAGNOSIS — A0472 Enterocolitis due to Clostridium difficile, not specified as recurrent: Secondary | ICD-10-CM

## 2021-02-11 DIAGNOSIS — D631 Anemia in chronic kidney disease: Secondary | ICD-10-CM | POA: Diagnosis not present

## 2021-02-11 DIAGNOSIS — D509 Iron deficiency anemia, unspecified: Secondary | ICD-10-CM | POA: Diagnosis not present

## 2021-02-11 DIAGNOSIS — E1129 Type 2 diabetes mellitus with other diabetic kidney complication: Secondary | ICD-10-CM | POA: Diagnosis not present

## 2021-02-11 DIAGNOSIS — Z992 Dependence on renal dialysis: Secondary | ICD-10-CM | POA: Diagnosis not present

## 2021-02-11 DIAGNOSIS — N040 Nephrotic syndrome with minor glomerular abnormality: Secondary | ICD-10-CM | POA: Diagnosis not present

## 2021-02-11 DIAGNOSIS — R300 Dysuria: Secondary | ICD-10-CM

## 2021-02-11 DIAGNOSIS — N2581 Secondary hyperparathyroidism of renal origin: Secondary | ICD-10-CM | POA: Diagnosis not present

## 2021-02-11 DIAGNOSIS — N186 End stage renal disease: Secondary | ICD-10-CM | POA: Diagnosis not present

## 2021-02-11 DIAGNOSIS — E119 Type 2 diabetes mellitus without complications: Secondary | ICD-10-CM | POA: Diagnosis not present

## 2021-02-11 NOTE — Addendum Note (Signed)
Addended by: Amalia Hailey on: 02/11/2021 11:41 AM   Modules accepted: Orders

## 2021-02-14 ENCOUNTER — Other Ambulatory Visit: Payer: Self-pay

## 2021-02-14 ENCOUNTER — Emergency Department (HOSPITAL_COMMUNITY): Payer: Medicare Other

## 2021-02-14 ENCOUNTER — Encounter (HOSPITAL_COMMUNITY): Payer: Self-pay | Admitting: Emergency Medicine

## 2021-02-14 ENCOUNTER — Inpatient Hospital Stay (HOSPITAL_COMMUNITY)
Admission: EM | Admit: 2021-02-14 | Discharge: 2021-02-21 | DRG: 871 | Disposition: A | Discharge status: Home / Self Care | Payer: Medicare Other | Attending: Internal Medicine | Admitting: Internal Medicine

## 2021-02-14 DIAGNOSIS — R519 Headache, unspecified: Secondary | ICD-10-CM | POA: Diagnosis not present

## 2021-02-14 DIAGNOSIS — G959 Disease of spinal cord, unspecified: Secondary | ICD-10-CM | POA: Diagnosis not present

## 2021-02-14 DIAGNOSIS — Z79899 Other long term (current) drug therapy: Secondary | ICD-10-CM

## 2021-02-14 DIAGNOSIS — Z923 Personal history of irradiation: Secondary | ICD-10-CM

## 2021-02-14 DIAGNOSIS — Z7982 Long term (current) use of aspirin: Secondary | ICD-10-CM | POA: Diagnosis not present

## 2021-02-14 DIAGNOSIS — I132 Hypertensive heart and chronic kidney disease with heart failure and with stage 5 chronic kidney disease, or end stage renal disease: Secondary | ICD-10-CM | POA: Diagnosis present

## 2021-02-14 DIAGNOSIS — I2510 Atherosclerotic heart disease of native coronary artery without angina pectoris: Secondary | ICD-10-CM | POA: Diagnosis present

## 2021-02-14 DIAGNOSIS — N2581 Secondary hyperparathyroidism of renal origin: Secondary | ICD-10-CM | POA: Diagnosis present

## 2021-02-14 DIAGNOSIS — E782 Mixed hyperlipidemia: Secondary | ICD-10-CM | POA: Diagnosis present

## 2021-02-14 DIAGNOSIS — Z9013 Acquired absence of bilateral breasts and nipples: Secondary | ICD-10-CM

## 2021-02-14 DIAGNOSIS — D631 Anemia in chronic kidney disease: Secondary | ICD-10-CM | POA: Diagnosis present

## 2021-02-14 DIAGNOSIS — C50211 Malignant neoplasm of upper-inner quadrant of right female breast: Secondary | ICD-10-CM | POA: Diagnosis not present

## 2021-02-14 DIAGNOSIS — E860 Dehydration: Secondary | ICD-10-CM | POA: Diagnosis present

## 2021-02-14 DIAGNOSIS — Z170 Estrogen receptor positive status [ER+]: Secondary | ICD-10-CM | POA: Diagnosis not present

## 2021-02-14 DIAGNOSIS — N250 Renal osteodystrophy: Secondary | ICD-10-CM | POA: Diagnosis not present

## 2021-02-14 DIAGNOSIS — Z20822 Contact with and (suspected) exposure to covid-19: Secondary | ICD-10-CM | POA: Diagnosis present

## 2021-02-14 DIAGNOSIS — Z833 Family history of diabetes mellitus: Secondary | ICD-10-CM

## 2021-02-14 DIAGNOSIS — I10 Essential (primary) hypertension: Secondary | ICD-10-CM | POA: Diagnosis not present

## 2021-02-14 DIAGNOSIS — Z992 Dependence on renal dialysis: Secondary | ICD-10-CM

## 2021-02-14 DIAGNOSIS — Z8249 Family history of ischemic heart disease and other diseases of the circulatory system: Secondary | ICD-10-CM

## 2021-02-14 DIAGNOSIS — E872 Acidosis: Secondary | ICD-10-CM | POA: Diagnosis present

## 2021-02-14 DIAGNOSIS — K219 Gastro-esophageal reflux disease without esophagitis: Secondary | ICD-10-CM | POA: Diagnosis present

## 2021-02-14 DIAGNOSIS — E876 Hypokalemia: Secondary | ICD-10-CM | POA: Diagnosis not present

## 2021-02-14 DIAGNOSIS — Z853 Personal history of malignant neoplasm of breast: Secondary | ICD-10-CM

## 2021-02-14 DIAGNOSIS — A419 Sepsis, unspecified organism: Secondary | ICD-10-CM

## 2021-02-14 DIAGNOSIS — R6520 Severe sepsis without septic shock: Secondary | ICD-10-CM | POA: Diagnosis not present

## 2021-02-14 DIAGNOSIS — E1122 Type 2 diabetes mellitus with diabetic chronic kidney disease: Secondary | ICD-10-CM | POA: Diagnosis present

## 2021-02-14 DIAGNOSIS — R509 Fever, unspecified: Secondary | ICD-10-CM | POA: Diagnosis not present

## 2021-02-14 DIAGNOSIS — D72829 Elevated white blood cell count, unspecified: Secondary | ICD-10-CM | POA: Diagnosis not present

## 2021-02-14 DIAGNOSIS — M898X9 Other specified disorders of bone, unspecified site: Secondary | ICD-10-CM | POA: Diagnosis present

## 2021-02-14 DIAGNOSIS — R42 Dizziness and giddiness: Secondary | ICD-10-CM | POA: Diagnosis not present

## 2021-02-14 DIAGNOSIS — I5032 Chronic diastolic (congestive) heart failure: Secondary | ICD-10-CM | POA: Diagnosis present

## 2021-02-14 DIAGNOSIS — I5030 Unspecified diastolic (congestive) heart failure: Secondary | ICD-10-CM | POA: Diagnosis not present

## 2021-02-14 DIAGNOSIS — A0472 Enterocolitis due to Clostridium difficile, not specified as recurrent: Secondary | ICD-10-CM | POA: Diagnosis not present

## 2021-02-14 DIAGNOSIS — R7881 Bacteremia: Secondary | ICD-10-CM | POA: Diagnosis not present

## 2021-02-14 DIAGNOSIS — N186 End stage renal disease: Secondary | ICD-10-CM | POA: Diagnosis not present

## 2021-02-14 DIAGNOSIS — E1129 Type 2 diabetes mellitus with other diabetic kidney complication: Secondary | ICD-10-CM | POA: Diagnosis not present

## 2021-02-14 DIAGNOSIS — A414 Sepsis due to anaerobes: Principal | ICD-10-CM | POA: Diagnosis present

## 2021-02-14 DIAGNOSIS — G9340 Encephalopathy, unspecified: Secondary | ICD-10-CM | POA: Diagnosis not present

## 2021-02-14 LAB — CBC WITH DIFFERENTIAL/PLATELET
Abs Immature Granulocytes: 0 10*3/uL (ref 0.00–0.07)
Basophils Absolute: 0.1 10*3/uL (ref 0.0–0.1)
Basophils Relative: 1 %
Eosinophils Absolute: 0.2 10*3/uL (ref 0.0–0.5)
Eosinophils Relative: 2 %
HCT: 34.5 % — ABNORMAL LOW (ref 36.0–46.0)
Hemoglobin: 11 g/dL — ABNORMAL LOW (ref 12.0–15.0)
Lymphocytes Relative: 16 %
Lymphs Abs: 1.7 10*3/uL (ref 0.7–4.0)
MCH: 33 pg (ref 26.0–34.0)
MCHC: 31.9 g/dL (ref 30.0–36.0)
MCV: 103.6 fL — ABNORMAL HIGH (ref 80.0–100.0)
Monocytes Absolute: 1.4 10*3/uL — ABNORMAL HIGH (ref 0.1–1.0)
Monocytes Relative: 13 %
Neutro Abs: 7.3 10*3/uL (ref 1.7–7.7)
Neutrophils Relative %: 68 %
Platelets: 228 10*3/uL (ref 150–400)
RBC: 3.33 MIL/uL — ABNORMAL LOW (ref 3.87–5.11)
RDW: 15.9 % — ABNORMAL HIGH (ref 11.5–15.5)
WBC: 10.8 10*3/uL — ABNORMAL HIGH (ref 4.0–10.5)
nRBC: 0 % (ref 0.0–0.2)
nRBC: 0 /100 WBC

## 2021-02-14 LAB — CSF CELL COUNT WITH DIFFERENTIAL
RBC Count, CSF: 0 /mm3
RBC Count, CSF: 84 /mm3 — ABNORMAL HIGH
Tube #: 1
Tube #: 4
WBC, CSF: 0 /mm3 (ref 0–5)
WBC, CSF: 0 /mm3 (ref 0–5)

## 2021-02-14 LAB — CBC
HCT: 37.5 % (ref 36.0–46.0)
Hemoglobin: 11.7 g/dL — ABNORMAL LOW (ref 12.0–15.0)
MCH: 33.2 pg (ref 26.0–34.0)
MCHC: 31.2 g/dL (ref 30.0–36.0)
MCV: 106.5 fL — ABNORMAL HIGH (ref 80.0–100.0)
Platelets: 238 10*3/uL (ref 150–400)
RBC: 3.52 MIL/uL — ABNORMAL LOW (ref 3.87–5.11)
RDW: 15.9 % — ABNORMAL HIGH (ref 11.5–15.5)
WBC: 9.8 10*3/uL (ref 4.0–10.5)
nRBC: 0 % (ref 0.0–0.2)

## 2021-02-14 LAB — PROTEIN AND GLUCOSE, CSF
Glucose, CSF: 77 mg/dL — ABNORMAL HIGH (ref 40–70)
Total  Protein, CSF: 37 mg/dL (ref 15–45)

## 2021-02-14 LAB — BASIC METABOLIC PANEL
Anion gap: 14 (ref 5–15)
BUN: 80 mg/dL — ABNORMAL HIGH (ref 6–20)
CO2: 22 mmol/L (ref 22–32)
Calcium: 10 mg/dL (ref 8.9–10.3)
Chloride: 99 mmol/L (ref 98–111)
Creatinine, Ser: 14.98 mg/dL — ABNORMAL HIGH (ref 0.44–1.00)
GFR, Estimated: 3 mL/min — ABNORMAL LOW (ref >60)
Glucose, Bld: 109 mg/dL — ABNORMAL HIGH (ref 70–99)
Potassium: 4.7 mmol/L (ref 3.5–5.1)
Sodium: 135 mmol/L (ref 135–145)

## 2021-02-14 LAB — APTT: aPTT: 29 seconds (ref 24–36)

## 2021-02-14 LAB — I-STAT BETA HCG BLOOD, ED (MC, WL, AP ONLY): I-stat hCG, quantitative: <5 m[IU]/mL (ref <5)

## 2021-02-14 LAB — RESP PANEL BY RT-PCR (FLU A&B, COVID) ARPGX2
Influenza A by PCR: NEGATIVE
Influenza B by PCR: NEGATIVE
SARS Coronavirus 2 by RT PCR: NEGATIVE

## 2021-02-14 LAB — PROTIME-INR
INR: 1.2 (ref 0.8–1.2)
Prothrombin Time: 15.2 seconds (ref 11.4–15.2)

## 2021-02-14 LAB — LACTIC ACID, PLASMA: Lactic Acid, Venous: 1.9 mmol/L (ref 0.5–1.9)

## 2021-02-14 LAB — PHOSPHORUS: Phosphorus: 1 mg/dL — CL (ref 2.5–4.6)

## 2021-02-14 MED ORDER — LIDOCAINE-EPINEPHRINE (PF) 2 %-1:200000 IJ SOLN
INTRAMUSCULAR | Status: AC
  Filled 2021-02-14: qty 20

## 2021-02-14 MED ORDER — DEXAMETHASONE SODIUM PHOSPHATE 10 MG/ML IJ SOLN
12.0000 mg | Freq: Four times a day (QID) | INTRAMUSCULAR | Status: DC
  Administered 2021-02-14 – 2021-02-15 (×3): 12 mg via INTRAVENOUS
  Filled 2021-02-14 (×4): qty 2

## 2021-02-14 MED ORDER — LACTATED RINGERS IV SOLN: INTRAVENOUS | Status: DC

## 2021-02-14 MED ORDER — DEXTROSE 5 % IV SOLN
310.0000 mg | INTRAVENOUS | Status: DC
  Administered 2021-02-14: 310 mg via INTRAVENOUS
  Filled 2021-02-14 (×2): qty 6.2

## 2021-02-14 MED ORDER — SODIUM CHLORIDE 0.9 % IV SOLN
2.0000 g | Freq: Two times a day (BID) | INTRAVENOUS | Status: DC
  Administered 2021-02-14 – 2021-02-15 (×2): 2 g via INTRAVENOUS
  Filled 2021-02-14 (×2): qty 20

## 2021-02-14 MED ORDER — SODIUM CHLORIDE 0.9 % IV SOLN: 2.0000 g | Freq: Two times a day (BID) | INTRAVENOUS | Status: DC

## 2021-02-14 MED ORDER — CINACALCET HCL 30 MG PO TABS
30.0000 mg | ORAL_TABLET | Freq: Every day | ORAL | Status: DC
  Administered 2021-02-14 – 2021-02-20 (×7): 30 mg via ORAL
  Filled 2021-02-14 (×7): qty 1

## 2021-02-14 MED ORDER — SODIUM CHLORIDE 0.9 % IV SOLN
2.0000 g | Freq: Two times a day (BID) | INTRAVENOUS | Status: DC
  Administered 2021-02-14 – 2021-02-15 (×2): 2 g via INTRAVENOUS
  Filled 2021-02-14 (×4): qty 2000

## 2021-02-14 MED ORDER — VANCOMYCIN HCL IN DEXTROSE 750-5 MG/150ML-% IV SOLN
750.0000 mg | INTRAVENOUS | Status: DC
  Administered 2021-02-15: 750 mg via INTRAVENOUS
  Filled 2021-02-14: qty 150

## 2021-02-14 MED ORDER — LIDOCAINE-EPINEPHRINE (PF) 2 %-1:200000 IJ SOLN
20.0000 mL | Freq: Once | INTRAMUSCULAR | Status: AC
  Administered 2021-02-14: 20 mL

## 2021-02-14 MED ORDER — SODIUM CHLORIDE 0.9 % IV SOLN: 2.0000 g | INTRAVENOUS | Status: DC

## 2021-02-14 MED ORDER — NEBIVOLOL HCL 10 MG PO TABS
10.0000 mg | ORAL_TABLET | Freq: Every day | ORAL | Status: DC
  Administered 2021-02-14 – 2021-02-16 (×3): 10 mg via ORAL
  Filled 2021-02-14 (×4): qty 1

## 2021-02-14 MED ORDER — BACID PO TABS
2.0000 | ORAL_TABLET | Freq: Three times a day (TID) | ORAL | Status: DC
  Filled 2021-02-14 (×2): qty 2

## 2021-02-14 MED ORDER — LANTHANUM CARBONATE 500 MG PO CHEW
3000.0000 mg | CHEWABLE_TABLET | Freq: Three times a day (TID) | ORAL | Status: DC
  Filled 2021-02-14: qty 6

## 2021-02-14 MED ORDER — PANTOPRAZOLE SODIUM 40 MG PO TBEC
40.0000 mg | DELAYED_RELEASE_TABLET | Freq: Every day | ORAL | Status: DC
  Administered 2021-02-14 – 2021-02-21 (×8): 40 mg via ORAL
  Filled 2021-02-14 (×8): qty 1

## 2021-02-14 MED ORDER — AMLODIPINE BESYLATE 5 MG PO TABS
5.0000 mg | ORAL_TABLET | Freq: Two times a day (BID) | ORAL | Status: DC
  Administered 2021-02-14 – 2021-02-16 (×4): 5 mg via ORAL
  Filled 2021-02-14 (×6): qty 1

## 2021-02-14 MED ORDER — RISAQUAD PO CAPS
1.0000 | ORAL_CAPSULE | Freq: Three times a day (TID) | ORAL | Status: DC
  Administered 2021-02-14 – 2021-02-21 (×19): 1 via ORAL
  Filled 2021-02-14 (×20): qty 1

## 2021-02-14 MED ORDER — HEPARIN SODIUM (PORCINE) 5000 UNIT/ML IJ SOLN
5000.0000 [IU] | Freq: Two times a day (BID) | INTRAMUSCULAR | Status: DC
  Administered 2021-02-15 – 2021-02-20 (×12): 5000 [IU] via SUBCUTANEOUS
  Filled 2021-02-14 (×12): qty 1

## 2021-02-14 MED ORDER — VANCOMYCIN HCL 1750 MG/350ML IV SOLN
1750.0000 mg | Freq: Once | INTRAVENOUS | Status: AC
  Administered 2021-02-14: 1750 mg via INTRAVENOUS
  Filled 2021-02-14: qty 350

## 2021-02-14 MED ORDER — METRONIDAZOLE 500 MG/100ML IV SOLN
500.0000 mg | Freq: Once | INTRAVENOUS | Status: AC
  Administered 2021-02-14: 500 mg via INTRAVENOUS
  Filled 2021-02-14: qty 100

## 2021-02-14 MED ORDER — RENA-VITE PO TABS
1.0000 | ORAL_TABLET | Freq: Every day | ORAL | Status: DC
  Administered 2021-02-14 – 2021-02-20 (×7): 1 via ORAL
  Filled 2021-02-14 (×7): qty 1

## 2021-02-14 MED ORDER — SODIUM CHLORIDE 0.9 % IV SOLN: 2.0000 g | Freq: Once | INTRAVENOUS | Status: DC

## 2021-02-14 MED ORDER — LIDOCAINE-PRILOCAINE 2.5-2.5 % EX CREA: 1.0000 "application " | TOPICAL_CREAM | CUTANEOUS | Status: DC

## 2021-02-14 MED ORDER — LANTHANUM CARBONATE 500 MG PO CHEW
2000.0000 mg | CHEWABLE_TABLET | ORAL | Status: DC | PRN
  Filled 2021-02-14: qty 4

## 2021-02-14 MED ORDER — LABETALOL HCL 5 MG/ML IV SOLN: 10.0000 mg | Freq: Four times a day (QID) | INTRAVENOUS | Status: DC | PRN

## 2021-02-14 MED ORDER — CHLORHEXIDINE GLUCONATE CLOTH 2 % EX PADS
6.0000 | MEDICATED_PAD | Freq: Every day | CUTANEOUS | Status: DC
  Administered 2021-02-15 – 2021-02-21 (×7): 6 via TOPICAL

## 2021-02-14 MED ORDER — DULOXETINE HCL 30 MG PO CPEP
30.0000 mg | ORAL_CAPSULE | Freq: Every day | ORAL | Status: DC
  Administered 2021-02-14 – 2021-02-21 (×8): 30 mg via ORAL
  Filled 2021-02-14 (×8): qty 1

## 2021-02-14 MED ORDER — SODIUM CHLORIDE 0.9 % IV SOLN: INTRAVENOUS | Status: DC

## 2021-02-14 MED ORDER — MECLIZINE HCL 12.5 MG PO TABS
12.5000 mg | ORAL_TABLET | Freq: Three times a day (TID) | ORAL | Status: DC | PRN
  Administered 2021-02-16 – 2021-02-19 (×4): 12.5 mg via ORAL
  Filled 2021-02-14 (×6): qty 1

## 2021-02-14 MED ORDER — GABAPENTIN 300 MG PO CAPS
300.0000 mg | ORAL_CAPSULE | Freq: Every day | ORAL | Status: DC
  Administered 2021-02-14 – 2021-02-20 (×7): 300 mg via ORAL
  Filled 2021-02-14 (×7): qty 1

## 2021-02-14 MED ORDER — MECLIZINE HCL 12.5 MG PO TABS
25.0000 mg | ORAL_TABLET | Freq: Once | ORAL | Status: AC
  Administered 2021-02-14: 25 mg via ORAL
  Filled 2021-02-14: qty 2

## 2021-02-14 NOTE — H&P (Addendum)
History and Physical    Megan [name] QQI:[id number] DOB: 1961/03/24 DOA: 02/14/2021  PCP: Megan Frizzle, MD (Confirm with patient/family/NH records and if not entered, this has to be entered at [facility] point of entry) Patient coming from: Home  I have personally briefly reviewed patient's old medical records in [place]  Chief Complaint: Vertigo, feeling tired  HPI: Megan [name] is a 60 y.o. 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  Symptoms started 2 days ago, patient woke up and developed vertigo like sensation, feeling things around her spinning, has been constant, no matter standing or sitting or lying down, feeling nausea and poor oral intake.  Meanwhile she also developed global headache, dull like, no vision changes no vomiting.  Family also noticed patient has not occasional twitching on her bilateral hands unintentional.  No fever or chills at home.  No neck pain.  Today she skipped her dialysis because feeling sick.  She denied any shortness of breath no cough.  She does not make urine.  Patient completed second round of C. difficile colitis treatment about 2 weeks ago with Dificid.  Since then, she continues to have 1-2 watery bowel movement every day, and was tested for C. difficile the day before yesterday was negative.  She also complaining about continued to feel " bloated" in her abdomen.  Patient had 2 episodes of C. difficile since May this year, was treated with vancomycin and then Dificid for 10 days for the second episode.  Her stool test on second episode of colitis also showed concurrent norovirus infection.  Patient underwent bilateral mastectomy and followed by 6 weeks of radiation  therapy which just completed 2 weeks ago with oncology.  She has been taking anastrozole as hormone manipulation.  ED Course: Had an MRI negative for acute findings.  WBC 9.8, BUN 80, creatinine 14.  Chest x-ray no acute infiltrates.  COVID negative.  The patient spiked fever 101.6, tachycardia and blood pressure elevated.  Review of Systems: As per HPI otherwise 14 point review of systems negative.    Past Medical History:  Diagnosis Date   (HFpEF) heart failure with preserved ejection fraction ([place])    a. 01/2019 Echo: EF 55-60%, mild conc LVH. DD.  Torn MV chordae.   Anemia    Atypical chest pain    a. 08/2018 MV: EF 59%, no ischemia; b. 02/2019 Cath: nonobs dzs.   Blood transfusion without reported diagnosis    Breast cancer (Megan [name]) 10/12/2020   Cataract    ESRD (end stage renal disease) on dialysis [facility])    a. HD T, T, S   Essential hypertension, benign    GERD (gastroesophageal reflux disease)    Headache    Hemorrhoids    Mixed hyperlipidemia    Morbid obesity (HCC)    Non-obstructive CAD (coronary artery disease)    a. 02/2019 CathL LM nl, LAD 35m, LCX nl, RCA 25p, EF 55-65%.   PONV (postoperative nausea and vomiting)    S/P colonoscopy Jan 2011   Dr. Benson Norway: sessile polyp (benign lymphoid), large hemorrhoids, repeat 5-10 years   Temporal arteritis ([place])    Type 2 diabetes mellitus ([place])    Wears glasses     Past Surgical History:  Procedure Laterality Date   ABDOMINAL HYSTERECTOMY  APPENDECTOMY     ARTERY BIOPSY N/A 05/09/2018   Procedure: RIGHT TEMPORAL ARTERY BIOPSY;  Surgeon: Judeth Horn, MD;  Location: [place];  Service: General;  Laterality: N/A;   BREAST BIOPSY Right 06/15/2020   Procedure: RIGHT BREAST BIOPSY;  Surgeon: Aviva Signs, MD;  Location: [facility];  Service: General;  Laterality: Right;   CATARACT EXTRACTION W/PHACO Left 02/09/2017   Procedure: CATARACT EXTRACTION PHACO AND INTRAOCULAR LENS PLACEMENT LEFT EYE;  Surgeon: Tonny Branch, MD;  Location: [facility];   Service: Ophthalmology;  Laterality: Left;  CDE: 4.89   CATARACT EXTRACTION W/PHACO Right 06/04/2017   Procedure: CATARACT EXTRACTION PHACO AND INTRAOCULAR LENS PLACEMENT (IOC);  Surgeon: Tonny Branch, MD;  Location: [facility];  Service: Ophthalmology;  Laterality: Right;  CDE: 4.12   CHOLECYSTECTOMY  09/29/2011   Procedure: LAPAROSCOPIC CHOLECYSTECTOMY;  Surgeon: Jamesetta So, MD;  Location: [facility];  Service: General;  Laterality: N/A;   COLONOSCOPY  Jan 2011   Dr. Benson Norway: sessile polyp (benign lymphoid), large hemorrhoids, repeat 5-10 years   COLONOSCOPY N/A 06/12/2016   Procedure: COLONOSCOPY;  Surgeon: Daneil Dolin, MD;  [facility];  Service: Endoscopy;  Laterality: N/A;  1230    ESOPHAGOGASTRODUODENOSCOPY  09/05/2011   QPR:FFMBW hiatal hernia; remainder of exam normal. No explanation for patient's abdominal pain with today's examination   ESOPHAGOGASTRODUODENOSCOPY N/A 12/17/2013   Dr. [name]: gastric erythema, erosion, mild chronic inflammation on path    EXCISION OF BREAST BIOPSY Right 10/12/2020   Procedure: EXCISION OF RIGHT BREAST BIOPSY;  Surgeon: Aviva Signs, MD;  Location[facility];  Service: General;  Laterality: Right;   LAPAROSCOPIC APPENDECTOMY  09/29/2011   Procedure: APPENDECTOMY LAPAROSCOPIC;  Surgeon: Jamesetta So, MD;  Location: [facility];  Service: General;;  incidental appendectomy   LEFT HEART CATH AND CORONARY ANGIOGRAPHY N/A 02/28/2019   Procedure: LEFT HEART CATH AND CORONARY ANGIOGRAPHY;  Surgeon: Jettie Booze, MD;  Location: [place] [facility];  Service: Cardiovascular;  Laterality: N/A;   MASTECTOMY MODIFIED RADICAL Right 02/18/2020   Procedure: MASTECTOMY MODIFIED RADICAL;  Surgeon: Aviva Signs, MD;  Location: [facility];  Service: General;  Laterality: Right;   MASTECTOMY, PARTIAL Right 07/13/2020   Procedure: RIGHT PARTIAL MASTECTOMY;  Surgeon: Aviva Signs, MD;  Location: [facility];  Service: General;  Laterality: Right;   PARTIAL MASTECTOMY WITH NEEDLE  LOCALIZATION AND AXILLARY SENTINEL LYMPH NODE BX Right 09/18/2018   Procedure: RIGHT PARTIAL MASTECTOMY AFTER NEEDLE LOCALIZATION, SENTINEL LYMPH NODE BIOPSY RIGHT AXILLA;  Surgeon: Aviva Signs, MD;  Location: [facility];  Service: General;  Laterality: Right;   SIMPLE MASTECTOMY WITH AXILLARY SENTINEL NODE BIOPSY Left 06/15/2020   Procedure: LEFT SIMPLE MASTECTOMY;  Surgeon: Aviva Signs, MD;  Location: [facility];  Service: General;  Laterality: Left;     reports that she has never smoked. She has never used smokeless tobacco. She reports that she does not drink alcohol and does not use drugs.  No Known Allergies  Family History  Problem Relation Age of Onset   Hypertension Mother    Coronary artery disease Mother    Diabetes Mother    Hypertension Sister    Coronary artery disease Sister    Hypertension Brother    Heart attack Father    Hypertension Son    Heart attack Maternal Aunt    Hypertension Maternal Aunt    Diabetes Maternal Aunt    Heart attack Maternal Uncle    Hypertension Maternal Uncle    Diabetes Maternal Uncle  Heart attack Paternal Aunt    Hypertension Paternal Aunt    Diabetes Paternal Aunt    Heart attack Paternal Uncle    Hypertension Paternal Uncle    Diabetes Paternal Uncle    Heart attack Maternal Grandmother    Heart attack Maternal Grandfather    Heart attack Paternal Grandmother    Heart attack Paternal Grandfather    Colon cancer Neg Hx      Prior to Admission medications   Medication Sig Start Date End Date Taking? Authorizing Provider  amLODipine (NORVASC) 10 MG tablet TAKE 1 TABLET BY MOUTH AT BEDTIME 11/25/20  Yes Megan Frizzle, MD  anastrozole (ARIMIDEX) 1 MG tablet TAKE 1 TABLET(1 MG) BY MOUTH DAILY 10/15/20  Yes Derek Jack, MD  aspirin EC 81 MG tablet Take 81 mg by mouth daily.   Yes [provider]  atorvastatin (LIPITOR) 20 MG tablet Take 1 tablet (20 mg total) by mouth daily. Patient taking differently: Take 20 mg by  mouth at bedtime. 06/29/20  Yes Megan Frizzle, MD  B Complex-C-Folic Acid (DIALYVITE 962) 0.8 MG TABS Take 1 tablet by mouth daily. 01/22/18  Yes [provider]  cinacalcet (SENSIPAR) 30 MG tablet Take 2 tablets (60 mg total) by mouth daily. Patient taking differently: Take 30 mg by mouth daily with supper. 06/13/18  Yes Megan Frizzle, MD  DULoxetine (CYMBALTA) 30 MG capsule TAKE 1 CAPSULE(30 MG) BY MOUTH DAILY 02/07/21  Yes Megan Frizzle, MD  gabapentin (NEURONTIN) 300 MG capsule TAKE 1 CAPSULE(300 MG) BY MOUTH AT BEDTIME Patient taking differently: Take 300 mg by mouth at bedtime. 12/13/20  Yes Megan Frizzle, MD  lanthanum (FOSRENOL) 1000 MG chewable tablet Chew 2,000-3,000 mg by mouth See admin instructions. Take 3 tablets (3000 mg) by mouth with meals and take 2 tablets (2000 mg) with snacks 10/16/19  Yes [provider]  lidocaine-prilocaine (EMLA) cream Apply 1 application topically every Monday, Wednesday, and Friday with hemodialysis.  09/24/18  Yes [provider]  nebivolol (BYSTOLIC) 10 MG tablet Take 1 tablet (10 mg total) by mouth in the morning and at bedtime. 05/13/20  Yes Megan Frizzle, MD  pantoprazole (PROTONIX) 40 MG tablet TAKE 1 TABLET(40 MG) BY MOUTH DAILY 11/25/20  Yes Megan Frizzle, MD  cholestyramine (QUESTRAN) 4 g packet Take 1 packet (4 g total) by mouth daily with lunch. Patient not taking: Reported on 02/14/2021 12/31/20   Eulogio Bear, NP  fidaxomicin (DIFICID) 200 MG TABS tablet Take 1 tablet (200 mg total) by mouth 2 (two) times daily. Patient not taking: Reported on 02/14/2021 01/19/21   Milton Ferguson, MD  hydrALAZINE (APRESOLINE) 25 MG tablet Take by mouth. Patient not taking: Reported on 02/14/2021 06/26/16   [provider]  HYDROcodone-acetaminophen (NORCO) 5-325 MG tablet Take 1 tablet by mouth every 6 (six) hours as needed for moderate pain. Patient not taking: Reported on 02/14/2021 10/12/20   Aviva Signs, MD   losartan (COZAAR) 100 MG tablet Take 1 tablet by mouth daily. Patient not taking: Reported on 02/14/2021 02/23/17   [provider]  saccharomyces boulardii (FLORASTOR) 250 MG capsule Take 1 capsule (250 mg total) by mouth 2 (two) times daily. Patient not taking: Reported on 02/14/2021 01/27/21   Megan Frizzle, MD  vancomycin [facility]) 125 MG capsule  01/01/21   [provider]    Physical Exam: Vitals:   02/14/21 1315 02/14/21 1330 02/14/21 1512 02/14/21 1640  BP: (!) 157/75 (!) 163/78  Marland Kitchen)  147/76  Pulse: (!) 110 (!) 111  (!) 111  Resp: (!) 24 (!) 30  (!) 31  Temp:   (!) 101.6 F (38.7 C)   TempSrc:   Oral   SpO2: 95% 95%  95%  Weight:      Height:        Constitutional: NAD, calm, comfortable Vitals:   02/14/21 1315 02/14/21 1330 02/14/21 1512 02/14/21 1640  BP: (!) 157/75 (!) 163/78  (!) 147/76  Pulse: (!) 110 (!) 111  (!) 111  Resp: (!) 24 (!) 30  (!) 31  Temp:   (!) 101.6 F (38.7 C)   TempSrc:   Oral   SpO2: 95% 95%  95%  Weight:      Height:       Eyes: PERRL, lids and conjunctivae normal ENMT: Mucous membranes are dry. Posterior pharynx clear of any exudate or lesions.Normal dentition.  Neck: normal, supple, no masses, no thyromegaly Respiratory: clear to auscultation bilaterally, no wheezing, no crackles. Normal respiratory effort. No accessory muscle use.  Cardiovascular: Regular rate and rhythm, no murmurs / rubs / gallops. No extremity edema. 2+ pedal pulses. No carotid bruits.  Abdomen: no tenderness, no masses palpated. No hepatosplenomegaly. Bowel sounds positive.  Musculoskeletal: no clubbing / cyanosis. No joint deformity upper and lower extremities. Good ROM, no contractures. Normal muscle tone.  Skin: no rashes, lesions, ulcers. No induration Neurologic: CN 2-12 grossly intact. Sensation intact, DTR normal. Strength 5/5 in all 4.  Psychiatric: Normal judgment and insight. Alert and oriented x 3. Normal mood.  Lethargic    Labs on  Admission: I have personally reviewed following labs and imaging studies  CBC: Recent Labs  Lab 02/14/21 0843  WBC 9.8  HGB 11.7*  HCT 37.5  MCV 106.5*  PLT 829   Basic Metabolic Panel: Recent Labs  Lab 02/14/21 0843  NA 135  K 4.7  CL 99  CO2 22  GLUCOSE 109*  BUN 80*  CREATININE 14.98*  CALCIUM 10.0   GFR: Estimated Creatinine Clearance: 4 mL/min (A) (by C-G formula based on SCr of 14.98 mg/dL (H)). Liver Function Tests: No results for input(s): AST, ALT, ALKPHOS, BILITOT, PROT, ALBUMIN in the last 168 hours. No results for input(s): LIPASE, AMYLASE in the last 168 hours. No results for input(s): AMMONIA in the last 168 hours. Coagulation Profile: No results for input(s): INR, PROTIME in the last 168 hours. Cardiac Enzymes: No results for input(s): CKTOTAL, CKMB, CKMBINDEX, TROPONINI in the last 168 hours. BNP (last 3 results) No results for input(s): PROBNP in the last 8760 hours. HbA1C: No results for input(s): HGBA1C in the last 72 hours. CBG: No results for input(s): GLUCAP in the last 168 hours. Lipid Profile: No results for input(s): CHOL, HDL, LDLCALC, TRIG, CHOLHDL, LDLDIRECT in the last 72 hours. Thyroid Function Tests: No results for input(s): TSH, T4TOTAL, FREET4, T3FREE, THYROIDAB in the last 72 hours. Anemia Panel: No results for input(s): VITAMINB12, FOLATE, FERRITIN, TIBC, IRON, RETICCTPCT in the last 72 hours. Urine analysis:    Component Value Date/Time   COLORURINE YELLOW 10/03/2016 0704   APPEARANCEUR CLEAR 10/03/2016 0704   LABSPEC 1.010 10/03/2016 0704   PHURINE 6.0 10/03/2016 0704   GLUCOSEU 50 (A) 10/03/2016 0704   HGBUR NEGATIVE 10/03/2016 0704   BILIRUBINUR NEGATIVE 10/03/2016 0704   KETONESUR NEGATIVE 10/03/2016 0704   PROTEINUR 100 (A) 10/03/2016 0704   UROBILINOGEN 0.2 08/19/2011 1103   NITRITE NEGATIVE 10/03/2016 0704   LEUKOCYTESUR NEGATIVE 10/03/2016 0704  Radiological Exams on Admission: CT Head Wo Contrast  Result  Date: 02/14/2021 CLINICAL DATA:  Pt c/o being dizzy when walking since Saturday. Pt also c/o headache since SaturdayPatient complains of pressure to both ears and head. Denies dizziness when laying down greater dizziness when standing or sitting up.Denies weakness to arms or legs. Patient on dialysis. EXAM: CT HEAD WITHOUT CONTRAST TECHNIQUE: Contiguous axial images were obtained from the base of the skull through the vertex without intravenous contrast. COMPARISON:  None. FINDINGS: Brain: No evidence of acute infarction, hemorrhage, hydrocephalus, extra-axial collection or mass lesion/mass effect. Vascular: No hyperdense vessel or unexpected calcification. Skull: Normal. Negative for fracture or focal lesion. Sinuses/Orbits: Globes and orbits are unremarkable. Visualized sinuses are clear. Other: None. IMPRESSION: Normal unenhanced CT scan of the brain. Electronically Signed   By: Lajean Manes M.D.   On: 02/14/2021 08:01   MR BRAIN WO CONTRAST  Result Date: 02/14/2021 CLINICAL DATA:  Dizziness EXAM: MRI HEAD WITHOUT CONTRAST TECHNIQUE: Multiplanar, multiecho pulse sequences of the brain and surrounding structures were obtained without intravenous contrast. COMPARISON:  October 2019 FINDINGS: Brain: There is no acute infarction or intracranial hemorrhage. There is no intracranial mass, mass effect, or edema. There is no hydrocephalus or extra-axial fluid collection. Ventricles and sulci are normal in size and configuration. Patchy foci of T2 hyperintensity in the supratentorial white matter nonspecific but may reflect minor chronic microvascular ischemic changes. Vascular: Major vessel flow voids at the skull base are preserved. Skull and upper cervical spine: Normal marrow signal is preserved. Sinuses/Orbits: Paranasal sinuses are aerated. Bilateral lens replacements. Other: Sella is unremarkable.  Mastoid air cells are clear. IMPRESSION: No evidence of recent infarction, hemorrhage, or mass. Probable minor  chronic microvascular ischemic changes. Electronically Signed   By: Macy Mis M.D.   On: 02/14/2021 14:40   DG Chest Port 1 View  Result Date: 02/14/2021 CLINICAL DATA:  Evaluate for sepsis. EXAM: PORTABLE CHEST 1 VIEW COMPARISON:  07/03/2020 and older exams. FINDINGS: Cardiac silhouette is normal in size. No mediastinal or hilar masses. Clear lungs.  No convincing pleural effusion.  No pneumothorax. Stable changes from right breast surgery. Skeletal structures are grossly intact. IMPRESSION: No active disease. Electronically Signed   By: Lajean Manes M.D.   On: 02/14/2021 15:58    EKG: Independently reviewed. Sinus, no acute ST-T changes  Assessment/Plan Active Problems:   Sepsis ([place])  (please populate well all problems here in Problem List. (For example, if patient is on BP meds at home and you resume or decide to hold them, it is a problem that needs to be her. Same for CAD, COPD, HLD and so on)  Sepsis -Evidenced by fever, tachycardia, and mentation changes of lethargic.  Source wise, will need to rule out meningitis +/- encephalitis. -Discussed with infection disease Dr. Johnnye Sima, recommend lumbar puncture, broad coverage vancomycin, ceftriaxone twice daily, ampicillin and acyclovir. Less concern about recurrent C. difficile infection at this time. -ED physician to perform lumbar puncture. -Discussed with patient husband at bedside, who expressed understanding and agreed. -Other differentials, C. difficile less likely given the central nervous system presentation, chest x-ray negative for pneumonia.  Vertigo -Stroke ruled out.  Unclear whether this is part blood presentation of putative central nervous system infection versus other etiologies.  Work-up and treatment of meningitis/encephalitis as above. -PRN meclizine for now.  ESRD on HD -Appears to be dehydrated given poor oral intake and ongoing diarrhea -Maintenance IV fluid 50 mL/h normal saline.  HTN -Resume home BP meds  and add PRN Hydralazine  Breast cancer -Status post bilateral mastectomy and radiation therapy.  Outpatient oncology follow-up.  DVT prophylaxis: Heparin subcu Code Status: Full Code Family Communication: Husband at bedside Disposition Plan: Expect more than 2 midnight hospital stay Consults called: Nephro and ID. Admission status: Tele admit   Lequita Halt MD [facility] Pager [phone]  02/14/2021, 4:51 PM

## 2021-02-14 NOTE — ED Provider Notes (Signed)
Physical Exam  BP (!) 147/76   Pulse (!) 111   Temp (!) 101.6 F (38.7 C) (Oral)   Resp (!) 31   Ht 5\' 2"  (1.575 m)   Wt 80.3 kg   SpO2 95%   BMI 32.37 kg/m   Physical Exam  ED Course/Procedures   Clinical Course as of 02/14/21 1724  Mon Feb 14, 2021  0935 BUN and creatinine increased compared to previous values [JK]  0935 Head CT without acute findings [JK]  1044 Patient attempted to ambulate.  She was too unsteady to do so [JK]    Clinical Course User Index [JK] Dorie Rank, MD    .Lumbar Puncture  Date/Time: 02/14/2021 5:26 PM Performed by: Varney Biles, MD Authorized by: Varney Biles, MD   Consent:    Consent obtained:  Written   Consent given by:  Patient   Risks, benefits, and alternatives were discussed: yes     Risks discussed:  Infection, bleeding, pain, headache, nerve damage and repeat procedure   Alternatives discussed:  No treatment Universal protocol:    Procedure explained and questions answered to patient or proxy's satisfaction: yes     Relevant documents present and verified: yes     Test results available: yes     Imaging studies available: yes     Required blood products, implants, devices, and special equipment available: yes     Immediately prior to procedure a time out was called: yes     Site/side marked: yes     Patient identity confirmed:  Arm band Pre-procedure details:    Procedure purpose:  Diagnostic Sedation:    Sedation type:  None Anesthesia:    Anesthesia method:  Local infiltration   Local anesthetic:  Lidocaine 2% WITH epi Procedure details:    Lumbar space:  L3-L4 interspace   Patient position:  Sitting   Needle gauge:  18   Needle type:  Diamond point   Ultrasound guidance: no     Number of attempts:  2   Fluid appearance:  Clear   Tubes of fluid:  4   Total volume (ml):  12 Post-procedure details:    Puncture site:  Adhesive bandage applied   Procedure completion:  Tolerated well, no immediate  complications .Critical Care  Date/Time: 02/14/2021 5:27 PM Performed by: Varney Biles, MD Authorized by: Varney Biles, MD   Critical care provider statement:    Critical care time (minutes):  45   Critical care time was exclusive of:  Separately billable procedures and treating other patients   Critical care was necessary to treat or prevent imminent or life-threatening deterioration of the following conditions:  CNS failure or compromise and sepsis   Critical care was time spent personally by me on the following activities:  Discussions with consultants, evaluation of patient's response to treatment, examination of patient, ordering and performing treatments and interventions, ordering and review of laboratory studies, ordering and review of radiographic studies, pulse oximetry, re-evaluation of patient's condition, obtaining history from patient or surrogate and review of old charts  MDM     Patient has history of ESRD on HD, diabetes.  She was sent to the ER because of weakness, dizziness.  MRI of the brain was completed which was negative for stroke.  However, when we went to reassess the patient she was having rigors and noted to have a fever of 101.6.  Husband reports that patient has been also more " lethargic" and patient reports intermittent episodes of headaches.  Since  we have a fever of unknown origin, we discussed with him possibility of meningitis versus encephalitis as being the cause -and to be conservative and start antibiotics and wait and watch wishes to be aggressive and get the LP now.  Family has consented to getting LP at this time.  LP was completed without complication.  Medicine will admit for fever of unknown origin in a patient with hemodialysis.        Varney Biles, MD 02/14/21 [phone]

## 2021-02-14 NOTE — Sepsis Progress Note (Signed)
Secure chat with bedside nurse regarding LA and blood cultures. Waiting on lab to draw.

## 2021-02-14 NOTE — ED Triage Notes (Signed)
Pt c/o being dizzy when walking since Saturday. Pt also c/o headache since Saturday.

## 2021-02-14 NOTE — ED Notes (Signed)
RN attempted report x1.  

## 2021-02-14 NOTE — Sepsis Progress Note (Signed)
Sepsis protocol is being monitored by [facility]. 

## 2021-02-14 NOTE — Consult Note (Signed)
Reason for Consult: ESRD Referring Physician:  Dr. Wynetta Fines  Chief Complaint: Dizziness  Outpatient dialysis: MWF   [facility] T 3: 30  EDW 73.5 kg   Optiflux 180 NRe  2K Ca 2.5     Darbepoetin 120 mcg  Weekly  Last dose 6/27 (recently incr from 100) Iron  50 mcg weekly Heparin 2300U qtx Calcitriol 0.25 mcg PO with HD  Assessment/Plan:  history of breast CA with recurrence-  now s/p b/l mastectomy with recent completion of XRT x6 weeks + anastrozole. R/o meningitis -s/p lumbar puncture and on empiric therapy.  ESRD: normally MWF-  [facility] -  missed HD today, but no absolute indication and will place her on the schedule for tomorrow AM. Will not be able to get her to her EDW and she usually doesn't do well if over 2.5-3  liters are removed. She also has post hd h/a's fairly routinely. Will try UF profile #2 and if that doesn't help then decreasing the blood flow.  Hypertension: BP and volume status up but not dyspneic or hypoxic.  Continue home norvasc and bystolic  Anemia of ESRD:  hgb 11 -  No meds this hosp--> Unfortunately we were unable to hold ESA as OP in the setting of active breast ca given persistent drop of h/h actually requiring escalation from 100 -> 120 mcg weekly. Probably can go back down to 100.  Metabolic Bone Disease: to continue with home calcitriol, sensipar and fosrenol -> will recheck phos to manage binders.    HPI: Megan [name] is an 60 y.o. female  with history of hypertension, dCHF,  diabetes mellitus type 2 history of breast cancer recurrence in right breast s/p b/l mastectomy + XRT, recurrent C. Dif s/p vanco + Dificid completed 2 weeks ago and end-stage renal disease Monday Wednesday Friday [facility].   She was last admitted with  pulmonary edema and  pneumonia in 06/2020. She missed treatment last Wed but on 7/1 left dialysis with a weight of 74.2 kg. Patient presenting with new onset vertigo with the room spinning not related to position and  constant + nausea + anorexia. Also endorses headache but no diplopia or vomiting. She denies fever, chills, neck stiffness but missed dialysis today because of feeling poorly. Noted in the ED to have no infiltrates on CXR, COVID neg but fever of 101.6, tachycardic. K was 4.7 and BUN/ Cr 80/14.98. She had a lumbar puncture in the ED and started on empiric therapy.  ROS Pertinent items are noted in HPI.  Chemistry and CBC: Creat  Date/Time Value Ref Range Status  11/25/2020 09:40 AM 8.76 (H) 0.50 - 1.05 mg/dL Final    Comment:    Verified by repeat analysis. . For patients >22 years of age, the reference limit for Creatinine is approximately 13% higher for people identified as African-American. Marland Kitchen   04/03/2017 10:19 AM 7.25 (H) 0.50 - 1.05 mg/dL Final    Comment:      For patients > or = 59 years of age: The upper reference limit for Creatinine is approximately 13% higher for people identified as African-American.     04/07/2016 09:03 AM 4.62 (H) 0.50 - 1.05 mg/dL Final    Comment:      For patients > or = 60 years of age: The upper reference limit for Creatinine is approximately 13% higher for people identified as African-American.     03/30/2016 11:55 AM 5.35 (H) 0.50 - 1.05 mg/dL Final    Comment:  For patients > or = 59 years of age: The upper reference limit for Creatinine is approximately 13% higher for people identified as African-American.     08/27/2015 08:15 AM 3.22 (H) 0.50 - 1.05 mg/dL Final  05/26/2015 12:18 PM 2.68 (H) 0.50 - 1.05 mg/dL Final  08/31/2014 08:15 AM 1.93 (H) 0.50 - 1.10 mg/dL Final  03/17/2014 08:15 AM 2.03 (H) 0.50 - 1.10 mg/dL Final  10/20/2013 08:28 AM 1.71 (H) 0.50 - 1.10 mg/dL Final  02/05/2013 12:54 PM 1.58 (H) 0.50 - 1.10 mg/dL Final  08/30/2011 11:35 AM 1.58 (H) 0.50 - 1.10 mg/dL Final  07/13/2011 11:09 AM 1.28 (H) 0.50 - 1.10 mg/dL Final   Creatinine, Ser  Date/Time Value Ref Range Status  02/14/2021 08:43 AM 14.98 (H) 0.44 -  1.00 mg/dL Final  01/19/2021 03:25 PM 9.66 (H) 0.44 - 1.00 mg/dL Final  10/28/2020 09:50 AM 8.11 (H) 0.44 - 1.00 mg/dL Final  10/12/2020 08:34 AM 8.05 (H) 0.44 - 1.00 mg/dL Final  09/07/2020 11:03 AM 7.64 (H) 0.44 - 1.00 mg/dL Final  07/13/2020 07:05 AM 8.20 (H) 0.44 - 1.00 mg/dL Final  07/03/2020 02:29 AM 6.10 (H) 0.44 - 1.00 mg/dL Final  06/16/2020 04:29 AM 10.21 (H) 0.44 - 1.00 mg/dL Final  06/15/2020 06:52 AM 8.90 (H) 0.44 - 1.00 mg/dL Final  02/24/2020 02:21 PM 9.63 (H) 0.44 - 1.00 mg/dL Final  02/19/2020 04:01 AM 6.17 (H) 0.44 - 1.00 mg/dL Final    Comment:    DELTA CHECK NOTED  02/17/2020 08:40 AM 9.26 (H) 0.44 - 1.00 mg/dL Final  09/08/2019 03:11 PM 7.30 (H) 0.44 - 1.00 mg/dL Final  09/02/2019 11:15 AM 7.74 (H) 0.44 - 1.00 mg/dL Final  06/02/2019 12:26 PM 6.98 (H) 0.44 - 1.00 mg/dL Final  03/01/2019 03:20 AM 6.94 (H) 0.44 - 1.00 mg/dL Final  02/28/2019 08:46 AM 5.70 (H) 0.44 - 1.00 mg/dL Final  02/24/2019 09:45 AM 7.00 (H) 0.44 - 1.00 mg/dL Final  02/14/2019 05:36 AM 6.01 (H) 0.44 - 1.00 mg/dL Final  02/04/2019 05:22 AM 5.42 (H) 0.44 - 1.00 mg/dL Final    Comment:    DELTA CHECK NOTED  02/03/2019 04:43 AM 8.16 (H) 0.44 - 1.00 mg/dL Final  02/03/2019 12:30 AM 7.97 (H) 0.44 - 1.00 mg/dL Final  01/29/2019 10:13 AM 7.13 (H) 0.44 - 1.00 mg/dL Final  01/13/2019 10:01 AM 9.04 (H) 0.44 - 1.00 mg/dL Final  01/01/2019 09:06 AM 7.22 (H) 0.44 - 1.00 mg/dL Final  12/12/2018 12:58 PM 4.59 (H) 0.44 - 1.00 mg/dL Final    Comment:    DELTA CHECK NOTED  12/11/2018 08:44 AM 7.06 (H) 0.44 - 1.00 mg/dL Final    Comment:    DELTA CHECK NOTED  12/10/2018 05:47 AM 10.95 (H) 0.44 - 1.00 mg/dL Final  11/20/2018 09:07 AM 8.20 (H) 0.44 - 1.00 mg/dL Final  10/28/2018 10:39 AM 9.95 (H) 0.44 - 1.00 mg/dL Final  08/18/2018 12:01 AM 10.28 (H) 0.44 - 1.00 mg/dL Final  02/07/2018 11:25 AM 5.26 (H) 0.44 - 1.00 mg/dL Final  06/04/2017 10:31 AM 6.90 (H) 0.44 - 1.00 mg/dL Final  03/17/2017 10:40 PM 7.53  (H) 0.44 - 1.00 mg/dL Final  02/01/2017 01:48 PM 8.29 (H) 0.44 - 1.00 mg/dL Final  10/03/2016 07:09 AM 4.89 (H) 0.44 - 1.00 mg/dL Final  09/22/2014 05:13 AM 2.03 (H) 0.50 - 1.10 mg/dL Final  05/14/2013 09:59 AM 1.62 (H) 0.50 - 1.10 mg/dL Final  09/28/2011 09:00 AM 1.40 (H) 0.50 - 1.10 mg/dL Final  08/19/2011 09:54 AM  1.24 (H) 0.50 - 1.10 mg/dL Final  04/19/2010 08:50 AM 1.25 (H) 0.4 - 1.2 mg/dL Final  10/11/2008 04:05 AM 1.28 (H) 0.4 - 1.2 mg/dL Final  10/10/2008 06:30 AM 1.30 (H) 0.4 - 1.2 mg/dL Final  03/11/2007 10:15 PM 1.59 (H) 0.40 - 1.20 mg/dL Final  10/24/2006 10:12 PM 1.46 (H) 0.40 - 1.20 mg/dL Final   Recent Labs  Lab 02/14/21 0843  NA 135  K 4.7  CL 99  CO2 22  GLUCOSE 109*  BUN 80*  CREATININE 14.98*  CALCIUM 10.0   Recent Labs  Lab 02/14/21 0843  WBC 9.8  HGB 11.7*  HCT 37.5  MCV 106.5*  PLT 238   Liver Function Tests: No results for input(s): AST, ALT, ALKPHOS, BILITOT, PROT, ALBUMIN in the last 168 hours. No results for input(s): LIPASE, AMYLASE in the last 168 hours. No results for input(s): AMMONIA in the last 168 hours. Cardiac Enzymes: No results for input(s): CKTOTAL, CKMB, CKMBINDEX, TROPONINI in the last 168 hours. Iron Studies: No results for input(s): IRON, TIBC, TRANSFERRIN, FERRITIN in the last 72 hours. PT/INR: @LABRCNTIP (inr:5)  Xrays/Other Studies: ) Results for orders placed or performed during the hospital encounter of 02/14/21 (from the past 48 hour(s))  CBC     Status: Abnormal   Collection Time: 02/14/21  8:43 AM  Result Value Ref Range   WBC 9.8 4.0 - 10.5 K/uL   RBC 3.52 (L) 3.87 - 5.11 MIL/uL   Hemoglobin 11.7 (L) 12.0 - 15.0 g/dL   HCT 37.5 36.0 - 46.0 %   MCV 106.5 (H) 80.0 - 100.0 fL   MCH 33.2 26.0 - 34.0 pg   MCHC 31.2 30.0 - 36.0 g/dL   RDW 15.9 (H) 11.5 - 15.5 %   Platelets 238 150 - 400 K/uL   nRBC 0.0 0.0 - 0.2 %    Comment: Performed at [facility], [address] [place], [place] [address]  Basic metabolic  panel     Status: Abnormal   Collection Time: 02/14/21  8:43 AM  Result Value Ref Range   Sodium 135 135 - 145 mmol/L   Potassium 4.7 3.5 - 5.1 mmol/L   Chloride 99 98 - 111 mmol/L   CO2 22 22 - 32 mmol/L   Glucose, Bld 109 (H) 70 - 99 mg/dL    Comment: Glucose reference range applies only to samples taken after fasting for at least 8 hours.   BUN 80 (H) 6 - 20 mg/dL   Creatinine, Ser 14.98 (H) 0.44 - 1.00 mg/dL   Calcium 10.0 8.9 - 10.3 mg/dL   GFR, Estimated 3 (L) >60 mL/min    Comment: (NOTE) Calculated using the CKD-EPI Creatinine Equation (2021)    Anion gap 14 5 - 15    Comment: Performed at [facility], [address] [place], [place] [address]  Resp Panel by RT-PCR (Flu A&B, Covid) Nasopharyngeal Swab     Status: None   Collection Time: 02/14/21 11:27 AM   Specimen: Nasopharyngeal Swab; Nasopharyngeal(NP) swabs in vial transport medium  Result Value Ref Range   SARS Coronavirus 2 by RT PCR NEGATIVE NEGATIVE    Comment: (NOTE) SARS-CoV-2 target nucleic acids are NOT DETECTED.  The SARS-CoV-2 RNA is generally detectable in upper respiratory specimens during the acute phase of infection. The lowest concentration of SARS-CoV-2 viral copies this assay can detect is 138 copies/mL. A negative result does not preclude SARS-Cov-2 infection and should not be used as the sole basis for treatment or other  patient management decisions. A negative result may occur with  improper specimen collection/handling, submission of specimen other than nasopharyngeal swab, presence of viral mutation(s) within the areas targeted by this assay, and inadequate number of viral copies(<138 copies/mL). A negative result must be combined with clinical observations, patient history, and epidemiological information. The expected result is Negative.  Fact Sheet for Patients:  EntrepreneurPulse.com.au  Fact Sheet for Healthcare Providers:   IncredibleEmployment.be  This test is no t yet approved or cleared by the [place] FDA and  has been authorized for detection and/or diagnosis of SARS-CoV-2 by FDA under an Emergency Use Authorization (EUA). This EUA will remain  in effect (meaning this test can be used) for the duration of the COVID-19 declaration under Section 564(b)(1) of the Act, 21 U.S.C.section 360bbb-3(b)(1), unless the authorization is terminated  or revoked sooner.       Influenza A by PCR NEGATIVE NEGATIVE   Influenza B by PCR NEGATIVE NEGATIVE    Comment: (NOTE) The Xpert Xpress SARS-CoV-2/FLU/RSV plus assay is intended as an aid in the diagnosis of influenza from Nasopharyngeal swab specimens and should not be used as a sole basis for treatment. Nasal washings and aspirates are unacceptable for Xpert Xpress SARS-CoV-2/FLU/RSV testing.  Fact Sheet for Patients: EntrepreneurPulse.com.au  Fact Sheet for Healthcare Providers: IncredibleEmployment.be  This test is not yet approved or cleared by the [place] FDA and has been authorized for detection and/or diagnosis of SARS-CoV-2 by FDA under an Emergency Use Authorization (EUA). This EUA will remain in effect (meaning this test can be used) for the duration of the COVID-19 declaration under Section 564(b)(1) of the Act, 21 U.S.C. section 360bbb-3(b)(1), unless the authorization is terminated or revoked.  Performed at [facility], [address], [place], [place] [address]    CT Head Wo Contrast  Result Date: 02/14/2021 CLINICAL DATA:  Pt c/o being dizzy when walking since Saturday. Pt also c/o headache since SaturdayPatient complains of pressure to both ears and head. Denies dizziness when laying down greater dizziness when standing or sitting up.Denies weakness to arms or legs. Patient on dialysis. EXAM: CT HEAD WITHOUT CONTRAST TECHNIQUE: Contiguous axial images were obtained from the  base of the skull through the vertex without intravenous contrast. COMPARISON:  None. FINDINGS: Brain: No evidence of acute infarction, hemorrhage, hydrocephalus, extra-axial collection or mass lesion/mass effect. Vascular: No hyperdense vessel or unexpected calcification. Skull: Normal. Negative for fracture or focal lesion. Sinuses/Orbits: Globes and orbits are unremarkable. Visualized sinuses are clear. Other: None. IMPRESSION: Normal unenhanced CT scan of the brain. Electronically Signed   By: Lajean Manes M.D.   On: 02/14/2021 08:01   MR BRAIN WO CONTRAST  Result Date: 02/14/2021 CLINICAL DATA:  Dizziness EXAM: MRI HEAD WITHOUT CONTRAST TECHNIQUE: Multiplanar, multiecho pulse sequences of the brain and surrounding structures were obtained without intravenous contrast. COMPARISON:  October 2019 FINDINGS: Brain: There is no acute infarction or intracranial hemorrhage. There is no intracranial mass, mass effect, or edema. There is no hydrocephalus or extra-axial fluid collection. Ventricles and sulci are normal in size and configuration. Patchy foci of T2 hyperintensity in the supratentorial white matter nonspecific but may reflect minor chronic microvascular ischemic changes. Vascular: Major vessel flow voids at the skull base are preserved. Skull and upper cervical spine: Normal marrow signal is preserved. Sinuses/Orbits: Paranasal sinuses are aerated. Bilateral lens replacements. Other: Sella is unremarkable.  Mastoid air cells are clear. IMPRESSION: No evidence of recent infarction, hemorrhage, or mass. Probable minor chronic microvascular ischemic changes.  Electronically Signed   By: Macy Mis M.D.   On: 02/14/2021 14:40   DG Chest Port 1 View  Result Date: 02/14/2021 CLINICAL DATA:  Evaluate for sepsis. EXAM: PORTABLE CHEST 1 VIEW COMPARISON:  07/03/2020 and older exams. FINDINGS: Cardiac silhouette is normal in size. No mediastinal or hilar masses. Clear lungs.  No convincing pleural effusion.   No pneumothorax. Stable changes from right breast surgery. Skeletal structures are grossly intact. IMPRESSION: No active disease. Electronically Signed   By: Lajean Manes M.D.   On: 02/14/2021 15:58    PMH:   Past Medical History:  Diagnosis Date   (HFpEF) heart failure with preserved ejection fraction ([place])    a. 01/2019 Echo: EF 55-60%, mild conc LVH. DD.  Torn MV chordae.   Anemia    Atypical chest pain    a. 08/2018 MV: EF 59%, no ischemia; b. 02/2019 Cath: nonobs dzs.   Blood transfusion without reported diagnosis    Breast cancer ([place]) 10/12/2020   Cataract    ESRD (end stage renal disease) on dialysis [facility])    a. HD T, T, S   Essential hypertension, benign    GERD (gastroesophageal reflux disease)    Headache    Hemorrhoids    Mixed hyperlipidemia    Morbid obesity (HCC)    Non-obstructive CAD (coronary artery disease)    a. 02/2019 CathL LM nl, LAD 3m, LCX nl, RCA 25p, EF 55-65%.   PONV (postoperative nausea and vomiting)    S/P colonoscopy Jan 2011   Dr. Benson Norway: sessile polyp (benign lymphoid), large hemorrhoids, repeat 5-10 years   Temporal arteritis ([place])    Type 2 diabetes mellitus ([place])    Wears glasses     PSH:   Past Surgical History:  Procedure Laterality Date   ABDOMINAL HYSTERECTOMY     APPENDECTOMY     ARTERY BIOPSY N/A 05/09/2018   Procedure: RIGHT TEMPORAL ARTERY BIOPSY;  Surgeon: Judeth Horn, MD;  Location: [place];  Service: General;  Laterality: N/A;   BREAST BIOPSY Right 06/15/2020   Procedure: RIGHT BREAST BIOPSY;  Surgeon: Aviva Signs, MD;  Location: [facility];  Service: General;  Laterality: Right;   CATARACT EXTRACTION W/PHACO Left 02/09/2017   Procedure: CATARACT EXTRACTION PHACO AND INTRAOCULAR LENS PLACEMENT LEFT EYE;  Surgeon: Tonny Branch, MD;  Location: [facility];  Service: Ophthalmology;  Laterality: Left;  CDE: 4.89   CATARACT EXTRACTION W/PHACO Right 06/04/2017   Procedure: CATARACT EXTRACTION PHACO AND INTRAOCULAR LENS PLACEMENT (IOC);  Surgeon:  Tonny Branch, MD;  Location: [facility];  Service: Ophthalmology;  Laterality: Right;  CDE: 4.12   CHOLECYSTECTOMY  09/29/2011   Procedure: LAPAROSCOPIC CHOLECYSTECTOMY;  Surgeon: Jamesetta So, MD;  Location: [facility];  Service: General;  Laterality: N/A;   COLONOSCOPY  Jan 2011   Dr. Benson Norway: sessile polyp (benign lymphoid), large hemorrhoids, repeat 5-10 years   COLONOSCOPY N/A 06/12/2016   Procedure: COLONOSCOPY;  Surgeon: Daneil Dolin, MD;  [facility];  Service: Endoscopy;  Laterality: N/A;  1230    ESOPHAGOGASTRODUODENOSCOPY  09/05/2011   [id number] hiatal hernia; remainder of exam normal. No explanation for patient's abdominal pain with today's examination   ESOPHAGOGASTRODUODENOSCOPY N/A 12/17/2013   Dr. [name]: gastric erythema, erosion, mild chronic inflammation on path    EXCISION OF BREAST BIOPSY Right 10/12/2020   Procedure: EXCISION OF RIGHT BREAST BIOPSY;  Surgeon: Aviva Signs, MD;  Location: [facility];  Service: General;  Laterality: Right;   LAPAROSCOPIC APPENDECTOMY  09/29/2011  Procedure: APPENDECTOMY LAPAROSCOPIC;  Surgeon: Jamesetta So, MD;  Location: [facility];  Service: General;;  incidental appendectomy   LEFT HEART CATH AND CORONARY ANGIOGRAPHY N/A 02/28/2019   Procedure: LEFT HEART CATH AND CORONARY ANGIOGRAPHY;  Surgeon: Jettie Booze, MD;  Location: [place] [facility];  Service: Cardiovascular;  Laterality: N/A;   MASTECTOMY MODIFIED RADICAL Right 02/18/2020   Procedure: MASTECTOMY MODIFIED RADICAL;  Surgeon: Aviva Signs, MD;  Location: [facility];  Service: General;  Laterality: Right;   MASTECTOMY, PARTIAL Right 07/13/2020   Procedure: RIGHT PARTIAL MASTECTOMY;  Surgeon: Aviva Signs, MD;  Location: [facility];  Service: General;  Laterality: Right;   PARTIAL MASTECTOMY WITH NEEDLE LOCALIZATION AND AXILLARY SENTINEL LYMPH NODE BX Right 09/18/2018   Procedure: RIGHT PARTIAL MASTECTOMY AFTER NEEDLE LOCALIZATION, SENTINEL LYMPH NODE BIOPSY RIGHT AXILLA;  Surgeon: Aviva Signs, MD;  Location: [facility];  Service: General;  Laterality: Right;   SIMPLE MASTECTOMY WITH AXILLARY SENTINEL NODE BIOPSY Left 06/15/2020   Procedure: LEFT SIMPLE MASTECTOMY;  Surgeon: Aviva Signs, MD;  Location: [facility];  Service: General;  Laterality: Left;    Allergies: No Known Allergies  Medications:   Prior to Admission medications   Medication Sig Start Date End Date Taking? Authorizing Provider  amLODipine (NORVASC) 10 MG tablet TAKE 1 TABLET BY MOUTH AT BEDTIME 11/25/20  Yes [name], MD  anastrozole (ARIMIDEX) 1 MG tablet TAKE 1 TABLET(1 MG) BY MOUTH DAILY 10/15/20  Yes Derek Jack, MD  aspirin EC 81 MG tablet Take 81 mg by mouth daily.   Yes [provider]  atorvastatin (LIPITOR) 20 MG tablet Take 1 tablet (20 mg total) by mouth daily. Patient taking differently: Take 20 mg by mouth at bedtime. 06/29/20  Yes [name], MD  B Complex-C-Folic Acid (DIALYVITE 213) 0.8 MG TABS Take 1 tablet by mouth daily. 01/22/18  Yes [provider]  cinacalcet (SENSIPAR) 30 MG tablet Take 2 tablets (60 mg total) by mouth daily. Patient taking differently: Take 30 mg by mouth daily with supper. 06/13/18  Yes [name], MD  DULoxetine (CYMBALTA) 30 MG capsule TAKE 1 CAPSULE(30 MG) BY MOUTH DAILY 02/07/21  Yes [name], MD  gabapentin (NEURONTIN) 300 MG capsule TAKE 1 CAPSULE(300 MG) BY MOUTH AT BEDTIME Patient taking differently: Take 300 mg by mouth at bedtime. 12/13/20  Yes [name], MD  lanthanum (FOSRENOL) 1000 MG chewable tablet Chew 2,000-3,000 mg by mouth See admin instructions. Take 3 tablets (3000 mg) by mouth with meals and take 2 tablets (2000 mg) with snacks 10/16/19  Yes [provider]  lidocaine-prilocaine (EMLA) cream Apply 1 application topically every Monday, Wednesday, and Friday with hemodialysis.  09/24/18  Yes [provider]  nebivolol (BYSTOLIC) 10 MG tablet Take 1 tablet (10 mg total) by mouth in  the morning and at bedtime. 05/13/20  Yes [name], MD  pantoprazole (PROTONIX) 40 MG tablet TAKE 1 TABLET(40 MG) BY MOUTH DAILY 11/25/20  Yes [name], MD  cholestyramine (QUESTRAN) 4 g packet Take 1 packet (4 g total) by mouth daily with lunch. Patient not taking: Reported on 02/14/2021 12/31/20   Eulogio Bear, NP  fidaxomicin (DIFICID) 200 MG TABS tablet Take 1 tablet (200 mg total) by mouth 2 (two) times daily. Patient not taking: Reported on 02/14/2021 01/19/21   Milton Ferguson, MD  hydrALAZINE (APRESOLINE) 25 MG tablet Take by mouth. Patient not taking: Reported on 02/14/2021 06/26/16   [provider]  HYDROcodone-acetaminophen (NORCO) 5-325 MG tablet Take 1 tablet by mouth every 6 (six) hours as needed for moderate pain. Patient not taking: Reported on 02/14/2021 10/12/20   Aviva Signs, MD  losartan (COZAAR) 100 MG tablet Take 1 tablet by mouth daily. Patient not taking: Reported on 02/14/2021 02/23/17   [provider]  saccharomyces boulardii (FLORASTOR) 250 MG capsule Take 1 capsule (250 mg total) by mouth 2 (two) times daily. Patient not taking: Reported on 02/14/2021 01/27/21   [name], MD  vancomycin [facility]) 125 MG capsule  01/01/21   [provider]    Discontinued Meds:   Medications Discontinued During This Encounter  Medication Reason   Emollient (AQUAPHOR EX) Error   Darbepoetin Alfa (ARANESP, ALBUMIN FREE, IJ) Error   ceFEPIme (MAXIPIME) 2 g in sodium chloride 0.9 % 100 mL IVPB    ceFEPIme (MAXIPIME) 2 g in sodium chloride 0.9 % 100 mL IVPB    lactobacillus acidophilus (BACID) tablet 2 tablet Duplicate   cefTRIAXone (ROCEPHIN) 2 g in sodium chloride 0.9 % 100 mL IVPB    lactated ringers infusion     Social History:  reports that she has never smoked. She has never used smokeless tobacco. She reports that she does not drink alcohol and does not use drugs.  Family History:   Family History  Problem Relation Age of Onset    Hypertension Mother    Coronary artery disease Mother    Diabetes Mother    Hypertension Sister    Coronary artery disease Sister    Hypertension Brother    Heart attack Father    Hypertension Son    Heart attack Maternal Aunt    Hypertension Maternal Aunt    Diabetes Maternal Aunt    Heart attack Maternal Uncle    Hypertension Maternal Uncle    Diabetes Maternal Uncle    Heart attack Paternal Aunt    Hypertension Paternal Aunt    Diabetes Paternal Aunt    Heart attack Paternal Uncle    Hypertension Paternal Uncle    Diabetes Paternal Uncle    Heart attack Maternal Grandmother    Heart attack Maternal Grandfather    Heart attack Paternal Grandmother    Heart attack Paternal Grandfather    Colon cancer Neg Hx     Blood pressure (!) 147/76, pulse (!) 111, temperature (!) 101.6 F (38.7 C), temperature source Oral, resp. rate (!) 31, height 5\' 2"  (1.575 m), weight 80.3 kg, SpO2 95 %. Gen: NAD, supine  in bed CVS: RRR CHEST: minimal TTP Resp: clear b/l Abd: soft, nontender NABS Ext: no LE edema ACCESS: L forearm AVF + T/B       Dwana Melena, MD 02/14/2021, 6:07 PM

## 2021-02-14 NOTE — ED Provider Notes (Signed)
[facility] EMERGENCY DEPARTMENT Provider Note   CSN: [id number] Arrival date & time: 02/14/21  0555     History Chief Complaint  Patient presents with   Dizziness    Megan [name] is a 60 y.o. female.   Dizziness  Patient presents to the ED with complaints of dizziness as well as and headache.  Patient states she noticed the symptoms on Saturday.  They have been persistent since then.  Whenever she tries to get up and walk around she feels dizzy and unsteady.  Patient feels like she has to hold onto things.  She also has a mild headache.  The dizziness gets worse when she is active and moving around.  At rest she feels fine.  She does notice that turning her head also increases the symptoms even when she is at rest.  She is not having any trouble with her vision.  No trouble with her speech.  No vomiting or diarrhea.  No fevers or chills.  Patient does have a history of chronic kidney disease and is on dialysis.  She was supposed to go this morning.  Past Medical History:  Diagnosis Date   (HFpEF) heart failure with preserved ejection fraction ([place])    a. 01/2019 Echo: EF 55-60%, mild conc LVH. DD.  Torn MV chordae.   Anemia    Atypical chest pain    a. 08/2018 MV: EF 59%, no ischemia; b. 02/2019 Cath: nonobs dzs.   Blood transfusion without reported diagnosis    Breast cancer ([place]) 10/12/2020   Cataract    ESRD (end stage renal disease) on dialysis [facility])    a. HD T, T, S   Essential hypertension, benign    GERD (gastroesophageal reflux disease)    Headache    Hemorrhoids    Mixed hyperlipidemia    Morbid obesity (HCC)    Non-obstructive CAD (coronary artery disease)    a. 02/2019 CathL LM nl, LAD 14m, LCX nl, RCA 25p, EF 55-65%.   PONV (postoperative nausea and vomiting)    S/P colonoscopy Jan 2011   Dr. Benson Norway: sessile polyp (benign lymphoid), large hemorrhoids, repeat 5-10 years   Temporal arteritis ([place])    Type 2 diabetes mellitus ([place])    Wears glasses      Patient Active Problem List   Diagnosis Date Noted   Breast mass, right    History of right breast cancer    Lobar pneumonia, unspecified organism ([place]) 07/03/2020   Pleural effusion, right    S/P left mastectomy 06/15/2020   Allergy, unspecified, initial encounter 03/03/2020   Anaphylactic shock, unspecified, initial encounter 03/03/2020   Breast cancer ([place]) 02/18/2020   History of modified radical mastectomy of right breast 02/18/2020   S/P mastectomy, right 02/18/2020   ESRD (end stage renal disease) on dialysis ([place])    Grade III hemorrhoids 07/31/2019   Fluid overload, unspecified 05/24/2019   Anaphylactic reaction due to adverse effect of correct drug or medicament properly administered, initial encounter 04/30/2019   Unspecified protein-calorie malnutrition ([place]) 03/03/2019   Hypertensive heart disease with heart failure ([place]) 03/01/2019   Atherosclerotic heart disease of native coronary artery without angina pectoris 03/01/2019   Chest pain 02/28/2019   Chest pain with high risk for cardiac etiology 02/28/2019   Elevated troponin 02/03/2019   ESRD (end stage renal disease) ([place]) 41/96/2229   Acute diastolic CHF (congestive heart failure) ([place]) 02/03/2019   Hypokalemia 02/03/2019   Acute respiratory failure with hypoxia ([place]) 02/03/2019   Malignant neoplasm of  upper-inner quadrant of right female breast ([place])    Hypocalcemia 09/03/2018   Hypothyroidism, unspecified 09/21/2017   Anemia in chronic kidney disease 04/12/2017   Iron deficiency anemia, unspecified 04/12/2017   Abnormal results of kidney function studies 04/11/2017   Acidosis 04/11/2017   Anemia, unspecified 04/11/2017   Coagulation defect, unspecified ([place]) 04/11/2017   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, with stage 5 chronic kidney disease, or end stage renal disease ([place]) 04/11/2017   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 04/11/2017   Other  disorders of phosphorus metabolism 04/11/2017   Pain, unspecified 04/11/2017   Proteinuria, unspecified 04/11/2017   Pruritus, unspecified 04/11/2017   Unspecified kidney failure 04/11/2017   Encounter for prophylactic removal of breast 04/04/2017   Rectal bleeding 06/08/2016   Vaginal discharge 12/30/2015   Abdominal pain, epigastric 11/26/2013   GERD (gastroesophageal reflux disease)    Type 2 diabetes mellitus ([place])    CKD (chronic kidney disease) stage 3, GFR 30-59 ml/min (HCC)    External hemorrhoid 08/01/2011   Hematochezia 07/13/2011   Dyspnea 04/21/2010   Atypical chest pain 04/21/2010   Mixed hyperlipidemia 03/11/2008   OBESITY 03/11/2008   Essential hypertension, benign 03/11/2008    Past Surgical History:  Procedure Laterality Date   ABDOMINAL HYSTERECTOMY     APPENDECTOMY     ARTERY BIOPSY N/A 05/09/2018   Procedure: RIGHT TEMPORAL ARTERY BIOPSY;  Surgeon: Judeth Horn, MD;  Location: [facility];  Service: General;  Laterality: N/A;   BREAST BIOPSY Right 06/15/2020   Procedure: RIGHT BREAST BIOPSY;  Surgeon: Aviva Signs, MD;  Location: [facility];  Service: General;  Laterality: Right;   CATARACT EXTRACTION W/PHACO Left 02/09/2017   Procedure: CATARACT EXTRACTION PHACO AND INTRAOCULAR LENS PLACEMENT LEFT EYE;  Surgeon: Tonny Branch, MD;  Location: [facility];  Service: Ophthalmology;  Laterality: Left;  CDE: 4.89   CATARACT EXTRACTION W/PHACO Right 06/04/2017   Procedure: CATARACT EXTRACTION PHACO AND INTRAOCULAR LENS PLACEMENT (IOC);  Surgeon: Tonny Branch, MD;  Location: [facility];  Service: Ophthalmology;  Laterality: Right;  CDE: 4.12   CHOLECYSTECTOMY  09/29/2011   Procedure: LAPAROSCOPIC CHOLECYSTECTOMY;  Surgeon: Jamesetta So, MD;  Location: [facility];  Service: General;  Laterality: N/A;   COLONOSCOPY  Jan 2011   Dr. Benson Norway: sessile polyp (benign lymphoid), large hemorrhoids, repeat 5-10 years   COLONOSCOPY N/A 06/12/2016   Procedure: COLONOSCOPY;  Surgeon: Daneil Dolin, MD;   [facility];  Service: Endoscopy;  Laterality: N/A;  1230    ESOPHAGOGASTRODUODENOSCOPY  09/05/2011   DSK:AJGOT hiatal hernia; remainder of exam normal. No explanation for patient's abdominal pain with today's examination   ESOPHAGOGASTRODUODENOSCOPY N/A 12/17/2013   Dr. [name]: gastric erythema, erosion, mild chronic inflammation on path    EXCISION OF BREAST BIOPSY Right 10/12/2020   Procedure: EXCISION OF RIGHT BREAST BIOPSY;  Surgeon: Aviva Signs, MD;  Location: [facility];  Service: General;  Laterality: Right;   LAPAROSCOPIC APPENDECTOMY  09/29/2011   Procedure: APPENDECTOMY LAPAROSCOPIC;  Surgeon: Jamesetta So, MD;  Location: [facility];  Service: General;;  incidental appendectomy   LEFT HEART CATH AND CORONARY ANGIOGRAPHY N/A 02/28/2019   Procedure: LEFT HEART CATH AND CORONARY ANGIOGRAPHY;  Surgeon: Jettie Booze, MD;  Location: [facility];  Service: Cardiovascular;  Laterality: N/A;   MASTECTOMY MODIFIED RADICAL Right 02/18/2020   Procedure: MASTECTOMY MODIFIED RADICAL;  Surgeon: Aviva Signs, MD;  Location: [facility];  Service: General;  Laterality: Right;   MASTECTOMY,  PARTIAL Right 07/13/2020   Procedure: RIGHT PARTIAL MASTECTOMY;  Surgeon: Aviva Signs, MD;  Location: [facility];  Service: General;  Laterality: Right;   PARTIAL MASTECTOMY WITH NEEDLE LOCALIZATION AND AXILLARY SENTINEL LYMPH NODE BX Right 09/18/2018   Procedure: RIGHT PARTIAL MASTECTOMY AFTER NEEDLE LOCALIZATION, SENTINEL LYMPH NODE BIOPSY RIGHT AXILLA;  Surgeon: Aviva Signs, MD;  Location: [facility];  Service: General;  Laterality: Right;   SIMPLE MASTECTOMY WITH AXILLARY SENTINEL NODE BIOPSY Left 06/15/2020   Procedure: LEFT SIMPLE MASTECTOMY;  Surgeon: Aviva Signs, MD;  Location: [facility];  Service: General;  Laterality: Left;     OB History     Gravida      Para      Term      Preterm      AB      Living  1      SAB      IAB      Ectopic      Multiple      Live Births               Family History  Problem Relation Age of Onset   Hypertension Mother    Coronary artery disease Mother    Diabetes Mother    Hypertension Sister    Coronary artery disease Sister    Hypertension Brother    Heart attack Father    Hypertension Son    Heart attack Maternal Aunt    Hypertension Maternal Aunt    Diabetes Maternal Aunt    Heart attack Maternal Uncle    Hypertension Maternal Uncle    Diabetes Maternal Uncle    Heart attack Paternal Aunt    Hypertension Paternal Aunt    Diabetes Paternal Aunt    Heart attack Paternal Uncle    Hypertension Paternal Uncle    Diabetes Paternal Uncle    Heart attack Maternal Grandmother    Heart attack Maternal Grandfather    Heart attack Paternal Grandmother    Heart attack Paternal Grandfather    Colon cancer Neg Hx     Social History   Tobacco Use   Smoking status: Never   Smokeless tobacco: Never  Vaping Use   Vaping Use: Never used  Substance Use Topics   Alcohol use: No   Drug use: No    Home Medications Prior to Admission medications   Medication Sig Start Date End Date Taking? Authorizing Provider  amLODipine (NORVASC) 10 MG tablet TAKE 1 TABLET BY MOUTH AT BEDTIME 11/25/20   [name], MD  anastrozole (ARIMIDEX) 1 MG tablet TAKE 1 TABLET(1 MG) BY MOUTH DAILY 10/15/20   Derek Jack, MD  aspirin EC 81 MG tablet Take 81 mg by mouth daily.    [provider]  atorvastatin (LIPITOR) 20 MG tablet Take 1 tablet (20 mg total) by mouth daily. Patient taking differently: Take 20 mg by mouth at bedtime. 06/29/20   [name], MD  B Complex-C-Folic Acid (DIALYVITE 462) 0.8 MG TABS Take 1 tablet by mouth daily. 01/22/18   [provider]  cholestyramine (QUESTRAN) 4 g packet Take 1 packet (4 g total) by mouth daily with lunch. 12/31/20   Eulogio Bear, NP  cinacalcet (SENSIPAR) 30 MG tablet Take 2 tablets (60 mg total) by mouth daily. Patient taking differently: Take 30 mg by mouth  daily with supper. 06/13/18   [name], MD  Darbepoetin Alfa (ARANESP, ALBUMIN FREE, IJ) Darbepoetin Alfa (Aranesp) 10/22/20 10/21/21  [provider]  DULoxetine (CYMBALTA) 30 MG capsule TAKE 1 CAPSULE(30 MG) BY MOUTH DAILY 02/07/21   [name], MD  Emollient (AQUAPHOR EX) Apply 1 application topically daily as needed (burn).    [provider]  fidaxomicin (DIFICID) 200 MG TABS tablet Take 1 tablet (200 mg total) by mouth 2 (two) times daily. 01/19/21   Milton Ferguson, MD  gabapentin (NEURONTIN) 300 MG capsule TAKE 1 CAPSULE(300 MG) BY MOUTH AT BEDTIME Patient taking differently: Take 300 mg by mouth at bedtime. 12/13/20   [name], MD  hydrALAZINE (APRESOLINE) 25 MG tablet Take by mouth. 06/26/16   [provider]  HYDROcodone-acetaminophen (NORCO) 5-325 MG tablet Take 1 tablet by mouth every 6 (six) hours as needed for moderate pain. 10/12/20   Aviva Signs, MD  lanthanum (FOSRENOL) 1000 MG chewable tablet Chew 2,000-3,000 mg by mouth See admin instructions. Take 3 tablets (3000 mg) by mouth with meals and take 2 tablets (2000 mg) with snacks 10/16/19   [provider]  lidocaine-prilocaine (EMLA) cream Apply 1 application topically every Monday, Wednesday, and Friday with hemodialysis.  09/24/18   [provider]  losartan (COZAAR) 100 MG tablet Take 1 tablet by mouth daily. 02/23/17   [provider]  nebivolol (BYSTOLIC) 10 MG tablet Take 1 tablet (10 mg total) by mouth in the morning and at bedtime. 05/13/20   [name], MD  pantoprazole (PROTONIX) 40 MG tablet TAKE 1 TABLET(40 MG) BY MOUTH DAILY 11/25/20   [name], MD  saccharomyces boulardii (FLORASTOR) 250 MG capsule Take 1 capsule (250 mg total) by mouth 2 (two) times daily. 01/27/21   [name], MD  vancomycin [facility]) 125 MG capsule  01/01/21   [provider]    Allergies    Patient has no known allergies.  Review of Systems    Review of Systems  Neurological:  Positive for dizziness.  All other systems reviewed and are negative.  Physical Exam Updated Vital Signs BP (!) 146/82   Pulse (!) 104   Temp 98.2 F (36.8 C) (Oral)   Resp (!) 26   Ht 1.575 m (5\' 2" )   Wt 80.3 kg   SpO2 92%   BMI 32.37 kg/m   Physical Exam Vitals and nursing note reviewed.  Constitutional:      General: She is not in acute distress.    Appearance: She is well-developed.  HENT:     Head: Normocephalic and atraumatic.     Right Ear: External ear normal.     Left Ear: External ear normal.  Eyes:     General: No scleral icterus.       Right eye: No discharge.        Left eye: No discharge.     Conjunctiva/sclera: Conjunctivae normal.  Neck:     Trachea: No tracheal deviation.  Cardiovascular:     Rate and Rhythm: Normal rate and regular rhythm.  Pulmonary:     Effort: Pulmonary effort is normal. No respiratory distress.     Breath sounds: Normal breath sounds. No stridor. No wheezing or rales.  Abdominal:     General: Bowel sounds are normal. There is no distension.     Palpations: Abdomen is soft.     Tenderness: There is no abdominal tenderness. There is no guarding or rebound.  Musculoskeletal:        General: No tenderness.     Cervical back: Neck supple.  Skin:    General: Skin is warm and  dry.     Findings: No rash.  Neurological:     Mental Status: She is alert and oriented to person, place, and time.     Cranial Nerves: No cranial nerve deficit (No facial droop, extraocular movements intact, tongue midline ).     Sensory: No sensory deficit.     Motor: No abnormal muscle tone or seizure activity.     Coordination: Coordination normal.     Comments: No pronator drift bilateral upper extrem, able to hold both legs off bed for 5 seconds, sensation intact in all extremities, no visual field cuts, no left or right sided neglect, normal finger-nose exam bilaterally, no nystagmus noted     ED Results /  Procedures / Treatments   Labs (all labs ordered are listed, but only abnormal results are displayed) Labs Reviewed  CBC - Abnormal; Notable for the following components:      Result Value   RBC 3.52 (*)    Hemoglobin 11.7 (*)    MCV 106.5 (*)    RDW 15.9 (*)    All other components within normal limits  BASIC METABOLIC PANEL - Abnormal; Notable for the following components:   Glucose, Bld 109 (*)    BUN 80 (*)    Creatinine, Ser 14.98 (*)    GFR, Estimated 3 (*)    All other components within normal limits    EKG EKG Interpretation  Date/Time:  Monday February 14 2021 08:31:34 EDT Ventricular Rate:  99 PR Interval:  151 QRS Duration: 92 QT Interval:  361 QTC Calculation: 464 R Axis:   28 Text Interpretation: Sinus rhythm Probable left atrial enlargement No significant change since last tracing Confirmed by Dorie Rank [phone]) on 02/14/2021 9:44:50 AM  Radiology CT Head Wo Contrast  Result Date: 02/14/2021 CLINICAL DATA:  Pt c/o being dizzy when walking since Saturday. Pt also c/o headache since SaturdayPatient complains of pressure to both ears and head. Denies dizziness when laying down greater dizziness when standing or sitting up.Denies weakness to arms or legs. Patient on dialysis. EXAM: CT HEAD WITHOUT CONTRAST TECHNIQUE: Contiguous axial images were obtained from the base of the skull through the vertex without intravenous contrast. COMPARISON:  None. FINDINGS: Brain: No evidence of acute infarction, hemorrhage, hydrocephalus, extra-axial collection or mass lesion/mass effect. Vascular: No hyperdense vessel or unexpected calcification. Skull: Normal. Negative for fracture or focal lesion. Sinuses/Orbits: Globes and orbits are unremarkable. Visualized sinuses are clear. Other: None. IMPRESSION: Normal unenhanced CT scan of the brain. Electronically Signed   By: Lajean Manes M.D.   On: 02/14/2021 08:01    Procedures Procedures   Medications Ordered in ED Medications  meclizine  (ANTIVERT) tablet 25 mg (25 mg Oral Given 02/14/21 0802)    ED Course  I have reviewed the triage vital signs and the nursing notes.  Pertinent labs & imaging results that were available during my care of the patient were reviewed by me and considered in my medical decision making (see chart for details).  Clinical Course as of 02/14/21 1047  Mon Feb 14, 2021  0935 BUN and creatinine increased compared to previous values [JK]  0935 Head CT without acute findings [JK]  1044 Patient attempted to ambulate.  She was too unsteady to do so [JK]    Clinical Course User Index [JK] Dorie Rank, MD   MDM Rules/Calculators/A&P  Patient presented to the ED with balance difficulties that started this weekend.  She is not having any speech difficulties.  No vision difficulties.  No focal numbness or weakness.  She has had some intermittent tremulousness.  Patient's laboratory test do not show any signs of acute anemia or electrolyte abnormalities that would cause her symptoms.  CT scan does not show any acute findings.  Symptoms concerning for the possibility of peripheral versus central vertigo.  Patient still is feeling unsteady and does not feel like she can walk safely.  I am concerned about the possibility of stroke not evident on her head CT.  MRI is not available at this facility.  She will require transfer to [facility].  Patient does feel that she potentially might be able to go home if the MRI is negative.  Patient also did not receive her dialysis today but she is not having any breathing difficulties and there are no acute electrolyte abnormalities requiring emergent dialysis.  We will transfer ED to ED.  Case discussed with Dr. Kathrynn Humble. Final Clinical Impression(s) / ED Diagnoses Final diagnoses:  Dizziness     Dorie Rank, MD 02/14/21 1047

## 2021-02-14 NOTE — Progress Notes (Addendum)
Pharmacy Antibiotic Note  Megan [name] is a 60 y.o. female admitted on 02/14/2021 with sepsis.  Pharmacy initially consulted for Vancomycin and Cefepime dosing. Tm 101.6. WBC wnl. Pt is ESRD pt - T/T/S HD.  Addendum: Suspected meningitis so changing coverage to Vancomycin, Rocephin, Ampicillin, and Acyclovir. MD dosed ceftriaxone 2gm IV q12h (appropriate)  Plan: Ampicillin 2gm IV q12 Acyclovir 310 mg (5mg /kg adj BW) IV q24h Vancomycin 1750 mg now then 750 IV qHD Will f/u HD schedule/tolerance, micro data, and pt's clinical condition Vanc levels prn   Height: 5\' 2"  (157.5 cm) Weight: 80.3 kg (177 lb) IBW/kg (Calculated) : 50.1  Temp (24hrs), Avg:99.9 F (37.7 C), Min:98.2 F (36.8 C), Max:101.6 F (38.7 C)  Recent Labs  Lab 02/14/21 0843  WBC 9.8  CREATININE 14.98*    Estimated Creatinine Clearance: 4 mL/min (A) (by C-G formula based on SCr of 14.98 mg/dL (H)).    No Known Allergies  Antimicrobials this admission: 7/4 Ampicillin >> 7/4 Acyclovir >> 7/4 Ceftriaxone >> 7/4 Vanc >>  7/4 Cefepime x 1   Microbiology results:  Pending  Thank you for allowing pharmacy to be a part of this patient's care.  Sherlon Handing, PharmD, BCPS Please see amion for complete clinical pharmacist phone list 02/14/2021 3:44 PM

## 2021-02-14 NOTE — ED Notes (Signed)
Patient unable to ambulate. Stated she felt too dizzy and lightheaded to walk. Required assistance to stand at bedside.

## 2021-02-14 NOTE — ED Notes (Signed)
Rn reported to Dr. Kathrynn Humble that RN could only obtain one blood culture. Dr recommend to go ahead and start antibiotics.

## 2021-02-15 ENCOUNTER — Other Ambulatory Visit (HOSPITAL_COMMUNITY): Payer: Self-pay

## 2021-02-15 ENCOUNTER — Inpatient Hospital Stay (HOSPITAL_COMMUNITY): Payer: Medicare Other

## 2021-02-15 ENCOUNTER — Encounter (HOSPITAL_COMMUNITY): Payer: Self-pay | Admitting: Hematology

## 2021-02-15 DIAGNOSIS — A0472 Enterocolitis due to Clostridium difficile, not specified as recurrent: Secondary | ICD-10-CM

## 2021-02-15 DIAGNOSIS — Z992 Dependence on renal dialysis: Secondary | ICD-10-CM

## 2021-02-15 DIAGNOSIS — N186 End stage renal disease: Secondary | ICD-10-CM

## 2021-02-15 DIAGNOSIS — R42 Dizziness and giddiness: Secondary | ICD-10-CM

## 2021-02-15 DIAGNOSIS — R509 Fever, unspecified: Secondary | ICD-10-CM

## 2021-02-15 DIAGNOSIS — A419 Sepsis, unspecified organism: Secondary | ICD-10-CM

## 2021-02-15 DIAGNOSIS — C50211 Malignant neoplasm of upper-inner quadrant of right female breast: Secondary | ICD-10-CM

## 2021-02-15 DIAGNOSIS — R6520 Severe sepsis without septic shock: Secondary | ICD-10-CM

## 2021-02-15 DIAGNOSIS — Z170 Estrogen receptor positive status [ER+]: Secondary | ICD-10-CM

## 2021-02-15 DIAGNOSIS — I10 Essential (primary) hypertension: Secondary | ICD-10-CM

## 2021-02-15 DIAGNOSIS — G9340 Encephalopathy, unspecified: Secondary | ICD-10-CM

## 2021-02-15 LAB — CBC
HCT: 33.6 % — ABNORMAL LOW (ref 36.0–46.0)
Hemoglobin: 10.6 g/dL — ABNORMAL LOW (ref 12.0–15.0)
MCH: 32.9 pg (ref 26.0–34.0)
MCHC: 31.5 g/dL (ref 30.0–36.0)
MCV: 104.3 fL — ABNORMAL HIGH (ref 80.0–100.0)
Platelets: 208 10*3/uL (ref 150–400)
RBC: 3.22 MIL/uL — ABNORMAL LOW (ref 3.87–5.11)
RDW: 15.8 % — ABNORMAL HIGH (ref 11.5–15.5)
WBC: 11.9 10*3/uL — ABNORMAL HIGH (ref 4.0–10.5)
nRBC: 0 % (ref 0.0–0.2)

## 2021-02-15 LAB — BLOOD CULTURE ID PANEL (REFLEXED) - BCID2

## 2021-02-15 LAB — C. DIFFICILE GDH AND TOXIN A/B
GDH ANTIGEN: DETECTED
MICRO NUMBER:: 12075309
SPECIMEN QUALITY:: ADEQUATE
TOXIN A AND B: NOT DETECTED

## 2021-02-15 LAB — MRSA NEXT GEN BY PCR, NASAL: MRSA by PCR Next Gen: NOT DETECTED

## 2021-02-15 LAB — BASIC METABOLIC PANEL
Anion gap: 18 — ABNORMAL HIGH (ref 5–15)
BUN: 87 mg/dL — ABNORMAL HIGH (ref 6–20)
CO2: 17 mmol/L — ABNORMAL LOW (ref 22–32)
Calcium: 9.9 mg/dL (ref 8.9–10.3)
Chloride: 99 mmol/L (ref 98–111)
Creatinine, Ser: 16.19 mg/dL — ABNORMAL HIGH (ref 0.44–1.00)
GFR, Estimated: 2 mL/min — ABNORMAL LOW (ref >60)
Glucose, Bld: 168 mg/dL — ABNORMAL HIGH (ref 70–99)
Potassium: 4.8 mmol/L (ref 3.5–5.1)
Sodium: 134 mmol/L — ABNORMAL LOW (ref 135–145)

## 2021-02-15 LAB — CLOSTRIDIUM DIFFICILE TOXIN B, QUALITATIVE, REAL-TIME PCR: Toxigenic C. Difficile by PCR: DETECTED — AB

## 2021-02-15 LAB — PHOSPHORUS: Phosphorus: 3.1 mg/dL (ref 2.5–4.6)

## 2021-02-15 LAB — IRON AND TIBC
Iron: 30 ug/dL (ref 28–170)
Saturation Ratios: 15 % (ref 10.4–31.8)
TIBC: 195 ug/dL — ABNORMAL LOW (ref 250–450)
UIBC: 165 ug/dL

## 2021-02-15 LAB — LACTIC ACID, PLASMA: Lactic Acid, Venous: 1.3 mmol/L (ref 0.5–1.9)

## 2021-02-15 LAB — FERRITIN: Ferritin: 1742 ng/mL — ABNORMAL HIGH (ref 11–307)

## 2021-02-15 MED ORDER — FIDAXOMICIN 200 MG PO TABS
200.0000 mg | ORAL_TABLET | Freq: Two times a day (BID) | ORAL | Status: AC
  Administered 2021-02-15 – 2021-02-20 (×10): 200 mg via ORAL
  Filled 2021-02-15 (×10): qty 1

## 2021-02-15 MED ORDER — ACETAMINOPHEN 325 MG PO TABS
650.0000 mg | ORAL_TABLET | Freq: Four times a day (QID) | ORAL | Status: DC | PRN
  Administered 2021-02-15 – 2021-02-19 (×3): 650 mg via ORAL
  Filled 2021-02-15 (×3): qty 2

## 2021-02-15 MED ORDER — VANCOMYCIN HCL IN DEXTROSE 750-5 MG/150ML-% IV SOLN
750.0000 mg | Freq: Once | INTRAVENOUS | Status: DC
  Filled 2021-02-15: qty 150

## 2021-02-15 NOTE — Consult Note (Signed)
[facility]    Date of Admission:  02/14/2021     Total days of antibiotics                Reason for Consult: Fever   Referring Provider: Elgergawy Primary Care Provider: [name], MD   ASSESSMENT:  Megan [name] is a 60 y/o female with ESRD and breast cancer s/p radiation and bilateral mastectomy with recent history of C. Difficile colitis admitted with generalized weakness and dizziness. Meningitis work up was unremarkable and blood cultures are positive for 1/4 with Staphylococcus epidermidis likely representing a contaminant. There is no clear source of her fever. C. Difficile testing on 7/1 positive again following 2 courses of treatment with negative test between treatments. Will restart fidaxomicin taper with 200 mg twice daily for 5 days then 200 mg every other day for 20 days.  Will monitor response to treatment.   PLAN:  Start fidaxomicin 200 mg twice daily for 5 days. After 5 days change to fidaxomicin 200 mg daily every other day for 20 days. Enteric precautions.  Monitor cultures for any signs of bacteremia.    Active Problems:   Sepsis (HCC)    acidophilus  1 capsule Oral TID   amLODipine  5 mg Oral BID   Chlorhexidine Gluconate Cloth  6 each Topical Q0600   cinacalcet  30 mg Oral Q supper   DULoxetine  30 mg Oral Daily   gabapentin  300 mg Oral QHS   heparin  5,000 Units Subcutaneous Q12H   [START ON 02/16/2021] lidocaine-prilocaine  1 application Topical Q M,W,F-HD   multivitamin  1 tablet Oral Daily   nebivolol  10 mg Oral Daily   pantoprazole  40 mg Oral Daily     HPI: Megan [name] is a 60 y.o. female with previous medical history significant for ESRD on HD, chronic diastolic HF, breast cancer s/p radiation and bilateral mastectomy, and recent C. Difficile colitis admitted with vertigo, generalized weakness and arm twitching.  Megan [name] was initially diagnosed with C. Difficile colitis on 01/01/21 and  completed 10 days of oral vancomycin with initial improvements and worsening symptoms on 01/18/21 when she was seen at [facility] ED and treated with fidaxomicin for 10 day which she completed on 01/29/21. Between treatments she did test negative with her PCP. Of note stool also tested positive for norovirus with the second course of C. Difficile. She has been having 1-2 watery bowel movements per day with baseline of about 3 bowel movements per day. Over the past 24 hours she has had at least 5-6 watery bowel movements.   Symptoms of dizziness and generalized weakness started about 5 days ago. Febrile with a temperature of 101.6 in the last 24 hours. Initial concern for possible meningitis and was started on broad spectrum coverage with acyclovir, ampicillin, ceftriaxone, metronidazole, and vancomycin. LP revealed no evidence of meningitis with 0 WBC, protein 37 and glucose 77. Blood cultures with 1/4 bottles positive for Staphylococcus epidermidis. Fistula in left arm is with pain, redness, or swelling. Does have occasional abdominal cramping at times. No recent antibiotics prior to arrival.    Review of Systems: Review of Systems  Constitutional:  Negative for chills, fever and weight loss.  Respiratory:  Negative for cough, shortness of breath and wheezing.   Cardiovascular:  Negative for chest pain and leg swelling.  Gastrointestinal:  Positive for diarrhea. Negative for abdominal pain, constipation, nausea and vomiting.  Skin:  Negative for rash.  Neurological:  Positive for dizziness.    Past Medical History:  Diagnosis Date   (HFpEF) heart failure with preserved ejection fraction ([place])    a. 01/2019 Echo: EF 55-60%, mild conc LVH. DD.  Torn MV chordae.   Anemia    Atypical chest pain    a. 08/2018 MV: EF 59%, no ischemia; b. 02/2019 Cath: nonobs dzs.   Blood transfusion without reported diagnosis    Breast cancer ([place]) 10/12/2020   Cataract    ESRD (end stage renal disease) on dialysis  [facility])    a. HD T, T, S   Essential hypertension, benign    GERD (gastroesophageal reflux disease)    Headache    Hemorrhoids    Mixed hyperlipidemia    Morbid obesity (HCC)    Non-obstructive CAD (coronary artery disease)    a. 02/2019 CathL LM nl, LAD 68m, LCX nl, RCA 25p, EF 55-65%.   PONV (postoperative nausea and vomiting)    S/P colonoscopy Jan 2011   Dr. Benson [place]: sessile polyp (benign lymphoid), large hemorrhoids, repeat 5-10 years   Temporal arteritis ([place])    Type 2 diabetes mellitus ([place])    Wears glasses     Social History   Tobacco Use   Smoking status: Never   Smokeless tobacco: Never  Vaping Use   Vaping Use: Never used  Substance Use Topics   Alcohol use: No   Drug use: No    Family History  Problem Relation Age of Onset   Hypertension Mother    Coronary artery disease Mother    Diabetes Mother    Hypertension Sister    Coronary artery disease Sister    Hypertension Brother    Heart attack Father    Hypertension Son    Heart attack Maternal Aunt    Hypertension Maternal Aunt    Diabetes Maternal Aunt    Heart attack Maternal Uncle    Hypertension Maternal Uncle    Diabetes Maternal Uncle    Heart attack Paternal Aunt    Hypertension Paternal Aunt    Diabetes Paternal Aunt    Heart attack Paternal Uncle    Hypertension Paternal Uncle    Diabetes Paternal Uncle    Heart attack Maternal Grandmother    Heart attack Maternal Grandfather    Heart attack Paternal Grandmother    Heart attack Paternal Grandfather    Colon cancer Neg Hx     No Known Allergies  OBJECTIVE: Blood pressure 121/77, pulse 83, temperature 97.9 F (36.6 C), temperature source Oral, resp. rate 17, height 5\' 2"  (1.575 m), weight 75 kg, SpO2 95 %.  Physical Exam Constitutional:      General: She is not in acute distress.    Appearance: She is well-developed.  Cardiovascular:     Rate and Rhythm: Normal rate and regular rhythm.     Heart sounds: Normal heart sounds.      Comments: Fistula in left forearm with dressing in place that is clean and dry. No evidence of infection.  Pulmonary:     Effort: Pulmonary effort is normal.     Breath sounds: Normal breath sounds.  Skin:    General: Skin is warm and dry.  Neurological:     Mental Status: She is alert and oriented to person, place, and time.  Psychiatric:        Behavior: Behavior normal.        Thought Content: Thought content normal.        Judgment:  Judgment normal.    Lab Results Lab Results  Component Value Date   WBC 11.9 (H) 02/15/2021   HGB 10.6 (L) 02/15/2021   HCT 33.6 (L) 02/15/2021   MCV 104.3 (H) 02/15/2021   PLT 208 02/15/2021    Lab Results  Component Value Date   CREATININE 16.19 (H) 02/15/2021   BUN 87 (H) 02/15/2021   NA 134 (L) 02/15/2021   K 4.8 02/15/2021   CL 99 02/15/2021   CO2 17 (L) 02/15/2021    Lab Results  Component Value Date   ALT 22 01/19/2021   AST 14 (L) 01/19/2021   ALKPHOS 109 01/19/2021   BILITOT 0.8 01/19/2021     Microbiology: Recent Results (from the past 240 hour(s))  Resp Panel by RT-PCR (Flu A&B, Covid) Nasopharyngeal Swab     Status: None   Collection Time: 02/14/21 11:27 AM   Specimen: Nasopharyngeal Swab; Nasopharyngeal(NP) swabs in vial transport medium  Result Value Ref Range Status   SARS Coronavirus 2 by RT PCR NEGATIVE NEGATIVE Final    Comment: (NOTE) SARS-CoV-2 target nucleic acids are NOT DETECTED.  The SARS-CoV-2 RNA is generally detectable in upper respiratory specimens during the acute phase of infection. The lowest concentration of SARS-CoV-2 viral copies this assay can detect is 138 copies/mL. A negative result does not preclude SARS-Cov-2 infection and should not be used as the sole basis for treatment or other patient management decisions. A negative result may occur with  improper specimen collection/handling, submission of specimen other than nasopharyngeal swab, presence of viral mutation(s) within the areas  targeted by this assay, and inadequate number of viral copies(<138 copies/mL). A negative result must be combined with clinical observations, patient history, and epidemiological information. The expected result is Negative.  Fact Sheet for Patients:  EntrepreneurPulse.com.au  Fact Sheet for Healthcare Providers:  IncredibleEmployment.be  This test is no t yet approved or cleared by the [place] FDA and  has been authorized for detection and/or diagnosis of SARS-CoV-2 by FDA under an Emergency Use Authorization (EUA). This EUA will remain  in effect (meaning this test can be used) for the duration of the COVID-19 declaration under Section 564(b)(1) of the Act, 21 U.S.C.section 360bbb-3(b)(1), unless the authorization is terminated  or revoked sooner.       Influenza A by PCR NEGATIVE NEGATIVE Final   Influenza B by PCR NEGATIVE NEGATIVE Final    Comment: (NOTE) The Xpert Xpress SARS-CoV-2/FLU/RSV plus assay is intended as an aid in the diagnosis of influenza from Nasopharyngeal swab specimens and should not be used as a sole basis for treatment. Nasal washings and aspirates are unacceptable for Xpert Xpress SARS-CoV-2/FLU/RSV testing.  Fact Sheet for Patients: EntrepreneurPulse.com.au  Fact Sheet for Healthcare Providers: IncredibleEmployment.be  This test is not yet approved or cleared by the [place] FDA and has been authorized for detection and/or diagnosis of SARS-CoV-2 by FDA under an Emergency Use Authorization (EUA). This EUA will remain in effect (meaning this test can be used) for the duration of the COVID-19 declaration under Section 564(b)(1) of the Act, 21 U.S.C. section 360bbb-3(b)(1), unless the authorization is terminated or revoked.  Performed at [facility], [address], [place], [place] [address]   CSF culture w Gram Stain     Status: None (Preliminary result)    Collection Time: 02/14/21  5:24 PM   Specimen: CSF; Cerebrospinal Fluid  Result Value Ref Range Status   Specimen Description CSF  Final   Special Requests NONE  Final  Gram Stain   Final    WBC PRESENT, PREDOMINANTLY MONONUCLEAR NO ORGANISMS SEEN CYTOSPIN SMEAR    Culture   Final    NO GROWTH < 24 HOURS Performed at [facility] [address], [place], [place] [address]    Report Status PENDING  Incomplete  Blood Culture (routine x 2)     Status: None (Preliminary result)   Collection Time: 02/14/21  5:30 PM   Specimen: BLOOD  Result Value Ref Range Status   Specimen Description BLOOD RIGHT ANTECUBITAL  Final   Special Requests   Final    BOTTLES DRAWN AEROBIC AND ANAEROBIC Blood Culture adequate volume   Culture  Setup Time   Final    GRAM POSITIVE COCCI IN BOTH AEROBIC AND ANAEROBIC BOTTLES CRITICAL RESULT CALLED TO, READ BACK BY AND VERIFIED WITH: PHARMD J Iran [id number] AT 1334 BY CM Performed at [facility], [place] [address], [place], [place] [address]    Culture GRAM POSITIVE COCCI  Final   Report Status PENDING  Incomplete  Blood Culture ID Panel (Reflexed)     Status: Abnormal   Collection Time: 02/14/21  5:30 PM  Result Value Ref Range Status   Enterococcus faecalis NOT DETECTED NOT DETECTED Final   Enterococcus Faecium NOT DETECTED NOT DETECTED Final   Listeria monocytogenes NOT DETECTED NOT DETECTED Final   Staphylococcus species DETECTED (A) NOT DETECTED Final    Comment: CRITICAL RESULT CALLED TO, READ BACK BY AND VERIFIED WITH: PHARMD J Iran [id number] AT 1335 BY CM    Staphylococcus aureus (BCID) NOT DETECTED NOT DETECTED Final   Staphylococcus epidermidis DETECTED (A) NOT DETECTED Final    Comment: CRITICAL RESULT CALLED TO, READ BACK BY AND VERIFIED WITH: PHARMD J [place] [id number] AT 1335 BY CM    Staphylococcus lugdunensis NOT DETECTED NOT DETECTED Final   Streptococcus species NOT DETECTED NOT DETECTED Final   Streptococcus agalactiae NOT  DETECTED NOT DETECTED Final   Streptococcus pneumoniae NOT DETECTED NOT DETECTED Final   Streptococcus pyogenes NOT DETECTED NOT DETECTED Final   A.calcoaceticus-baumannii NOT DETECTED NOT DETECTED Final   Bacteroides fragilis NOT DETECTED NOT DETECTED Final   Enterobacterales NOT DETECTED NOT DETECTED Final   Enterobacter cloacae complex NOT DETECTED NOT DETECTED Final   Escherichia coli NOT DETECTED NOT DETECTED Final   Klebsiella aerogenes NOT DETECTED NOT DETECTED Final   Klebsiella oxytoca NOT DETECTED NOT DETECTED Final   Klebsiella pneumoniae NOT DETECTED NOT DETECTED Final   Proteus species NOT DETECTED NOT DETECTED Final   Salmonella species NOT DETECTED NOT DETECTED Final   Serratia marcescens NOT DETECTED NOT DETECTED Final   Haemophilus influenzae NOT DETECTED NOT DETECTED Final   Neisseria meningitidis NOT DETECTED NOT DETECTED Final   Pseudomonas aeruginosa NOT DETECTED NOT DETECTED Final   Stenotrophomonas maltophilia NOT DETECTED NOT DETECTED Final   Candida albicans NOT DETECTED NOT DETECTED Final   Candida auris NOT DETECTED NOT DETECTED Final   Candida glabrata NOT DETECTED NOT DETECTED Final   Candida krusei NOT DETECTED NOT DETECTED Final   Candida parapsilosis NOT DETECTED NOT DETECTED Final   Candida tropicalis NOT DETECTED NOT DETECTED Final   Cryptococcus neoformans/gattii NOT DETECTED NOT DETECTED Final   Methicillin resistance mecA/C NOT DETECTED NOT DETECTED Final    Comment: Performed at [facility], [address] [place], [place] [address]  MRSA Next Gen by PCR, Nasal     Status: None   Collection Time: 02/14/21 10:34 PM  Specimen: Nasal Mucosa; Nasal Swab  Result Value Ref Range Status   MRSA by PCR Next Gen NOT DETECTED NOT DETECTED Final    Comment: (NOTE) The GeneXpert MRSA Assay (FDA approved for NASAL specimens only), is one component of a comprehensive MRSA colonization surveillance program. It is not intended to diagnose MRSA  infection nor to guide or monitor treatment for MRSA infections. Test performance is not FDA approved in patients less than 55 years old. Performed at [facility], [place] [address] [place], [place] [address]      Terri Piedra, [facility]  02/15/2021  2:31 PM

## 2021-02-15 NOTE — Progress Notes (Signed)
Bilateral lower extremity venous duplex completed. Refer to "CV Proc" under chart review to view preliminary results.  02/15/2021 3:46 PM Kelby Aline., MHA, RVT, RDCS, RDMS

## 2021-02-15 NOTE — Progress Notes (Signed)
PROGRESS NOTE    Megan [name]  KDX:[id number] DOB: 09-06-1960 DOA: 02/14/2021 PCP: [name], MD    No chief complaint on file.   Brief narrative:    Megan [name] is a 60 y.o. 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  Work-up was significant for fever, SIRS, apparently on meningitis coverage given altered mental status, but her negative for infectious process, MRI with no acute process as well, she is ESRD, seen by renal, hemodialysis 7/5, as well followed by ID.     Assessment & Plan:   Active Problems:   Sepsis ([place])   SIRS  -Evidenced by fever, tachycardia, and mentation changes of lethargic.  -So far no clear source of infection, she was empirically on meningitis coverage including vancomycin, Rocephin, ampicillin and acyclovir, LP done in ED, gram stain is negative, no evidence of white blood cells, no clinical evidence of meningitis, has discontinued Rocephin, ampicillin and acyclovir especially with known history of recurrent C. difficile. -No clear source of infection, she does report diarrhea, with known history C. difficile, ID were consulted to see if it is appropriate to pursue C. difficile infection. -1/2 set blood culture growing staph epidermidis, likely contamination, but meanwhile we will continue with IV vancomycin. - chest x-ray negative for pneumonia. -Given she has fever with no clear source of infection, I will check venous Dopplers to rule out VTE.   Vertigo -Stroke ruled out.  MRI brain with no acute findings   ESRD on HD -Renal consulted, went for hemodialysis today.   HTN -Continue with amlodipine and Bystolic, blood pressure is acceptable, resume hydralazine  and Cozaar if started to increase.     Breast cancer -Status post bilateral mastectomy and radiation therapy.  Outpatient oncology follow-up.    DVT prophylaxis: Heparin Code Status: Full Family Communication:  Disposition:   Status is: Inpatient  Remains inpatient appropriate because:IV treatments appropriate due to intensity of illness or inability to take PO  Dispo: The patient is from: Home              Anticipated d/c is to: Home              Patient currently is not medically stable to d/c.   Difficult to place patient No       Consultants:  Infectious disease  Subjective:   He does report some diarrhea today, reports she is feeling better, her bowel movements are semisolid once discussed with staff  Objective: Vitals:   02/15/21 1200 02/15/21 1225 02/15/21 1245 02/15/21 1310  BP: 112/70 (!) 117/20  121/77  Pulse: 86 85  83  Resp: 19 20  17   Temp:  97.7 F (36.5 C)  97.9 F (36.6 C)  TempSrc:  Oral  Oral  SpO2:  96%  95%  Weight:   75 kg   Height:        Intake/Output Summary (Last 24 hours) at 02/15/2021 1445 Last data filed at 02/15/2021 1237 Gross per 24 hour  Intake 1791.15 ml  Output 3000 ml  Net -1208.85 ml   Filed Weights   02/14/21 0613 02/15/21 0809 02/15/21 1245  Weight: 80.3 kg 78 kg 75 kg    Examination:  General exam: Appears calm and comfortable  Respiratory system: Clear to auscultation. Respiratory  effort normal. Cardiovascular system: S1 & S2 heard, RRR. No JVD, murmurs, rubs, gallops or clicks. No pedal edema. Gastrointestinal system: Abdomen is nondistended, soft and nontender. No organomegaly or masses felt. Normal bowel sounds heard. Central nervous system: Alert and oriented. No focal neurological deficits. Extremities: Symmetric 5 x 5 power. Skin: No rashes, lesions or ulcers Psychiatry: Judgement and insight appear normal. Mood & affect appropriate.     Data Reviewed: I have personally reviewed following labs and  imaging studies  CBC: Recent Labs  Lab 02/14/21 0843 02/14/21 1720 02/15/21 0139  WBC 9.8 10.8* 11.9*  NEUTROABS  --  7.3  --   HGB 11.7* 11.0* 10.6*  HCT 37.5 34.5* 33.6*  MCV 106.5* 103.6* 104.3*  PLT 238 228 376    Basic Metabolic Panel: Recent Labs  Lab 02/14/21 0843 02/14/21 1730 02/15/21 0139  NA 135  --  134*  K 4.7  --  4.8  CL 99  --  99  CO2 22  --  17*  GLUCOSE 109*  --  168*  BUN 80*  --  87*  CREATININE 14.98*  --  16.19*  CALCIUM 10.0  --  9.9  PHOS  --  1.0* 3.1    GFR: Estimated Creatinine Clearance: 3.6 mL/min (A) (by C-G formula based on SCr of 16.19 mg/dL (H)).  Liver Function Tests: No results for input(s): AST, ALT, ALKPHOS, BILITOT, PROT, ALBUMIN in the last 168 hours.  CBG: No results for input(s): GLUCAP in the last 168 hours.   Recent Results (from the past 240 hour(s))  Resp Panel by RT-PCR (Flu A&B, Covid) Nasopharyngeal Swab     Status: None   Collection Time: 02/14/21 11:27 AM   Specimen: Nasopharyngeal Swab; Nasopharyngeal(NP) swabs in vial transport medium  Result Value Ref Range Status   SARS Coronavirus 2 by RT PCR NEGATIVE NEGATIVE Final    Comment: (NOTE) SARS-CoV-2 target nucleic acids are NOT DETECTED.  The SARS-CoV-2 RNA is generally detectable in upper respiratory specimens during the acute phase of infection. The lowest concentration of SARS-CoV-2 viral copies this assay can detect is 138 copies/mL. A negative result does not preclude SARS-Cov-2 infection and should not be used as the sole basis for treatment or other patient management decisions. A negative result may occur with  improper specimen collection/handling, submission of specimen other than nasopharyngeal swab, presence of viral mutation(s) within the areas targeted by this assay, and inadequate number of viral copies(<138 copies/mL). A negative result must be combined with clinical observations, patient history, and epidemiological information. The  expected result is Negative.  Fact Sheet for Patients:  EntrepreneurPulse.com.au  Fact Sheet for Healthcare Providers:  IncredibleEmployment.be  This test is no t yet approved or cleared by the [place] FDA and  has been authorized for detection and/or diagnosis of SARS-CoV-2 by FDA under an Emergency Use Authorization (EUA). This EUA will remain  in effect (meaning this test can be used) for the duration of the COVID-19 declaration under Section 564(b)(1) of the Act, 21 U.S.C.section 360bbb-3(b)(1), unless the authorization is terminated  or revoked sooner.       Influenza A by PCR NEGATIVE NEGATIVE Final   Influenza B by PCR NEGATIVE NEGATIVE Final    Comment: (NOTE) The Xpert Xpress SARS-CoV-2/FLU/RSV plus assay is intended as an aid in the diagnosis of influenza from Nasopharyngeal swab specimens and should not be used as a sole basis for treatment. Nasal washings and aspirates are unacceptable for Xpert Xpress SARS-CoV-2/FLU/RSV testing.  Fact  Sheet for Patients: EntrepreneurPulse.com.au  Fact Sheet for Healthcare Providers: IncredibleEmployment.be  This test is not yet approved or cleared by the [place] FDA and has been authorized for detection and/or diagnosis of SARS-CoV-2 by FDA under an Emergency Use Authorization (EUA). This EUA will remain in effect (meaning this test can be used) for the duration of the COVID-19 declaration under Section 564(b)(1) of the Act, 21 U.S.C. section 360bbb-3(b)(1), unless the authorization is terminated or revoked.  Performed at [facility], [address], [place], [place] [address]   CSF culture w Gram Stain     Status: None (Preliminary result)   Collection Time: 02/14/21  5:24 PM   Specimen: CSF; Cerebrospinal Fluid  Result Value Ref Range Status   Specimen Description CSF  Final   Special Requests NONE  Final   Gram Stain   Final    WBC  PRESENT, PREDOMINANTLY MONONUCLEAR NO ORGANISMS SEEN CYTOSPIN SMEAR    Culture   Final    NO GROWTH < 24 HOURS Performed at [facility], [place] [address], [place], [place] [address]    Report Status PENDING  Incomplete  Blood Culture (routine x 2)     Status: None (Preliminary result)   Collection Time: 02/14/21  5:30 PM   Specimen: BLOOD  Result Value Ref Range Status   Specimen Description BLOOD RIGHT ANTECUBITAL  Final   Special Requests   Final    BOTTLES DRAWN AEROBIC AND ANAEROBIC Blood Culture adequate volume   Culture  Setup Time   Final    GRAM POSITIVE COCCI IN BOTH AEROBIC AND ANAEROBIC BOTTLES CRITICAL RESULT CALLED TO, READ BACK BY AND VERIFIED WITH: PHARMD J [place] [id number] AT 1334 BY CM Performed at [facility], [place] [address] [place], [place] [address]    Culture GRAM POSITIVE COCCI  Final   Report Status PENDING  Incomplete  Blood Culture ID Panel (Reflexed)     Status: Abnormal   Collection Time: 02/14/21  5:30 PM  Result Value Ref Range Status   Enterococcus faecalis NOT DETECTED NOT DETECTED Final   Enterococcus Faecium NOT DETECTED NOT DETECTED Final   Listeria monocytogenes NOT DETECTED NOT DETECTED Final   Staphylococcus species DETECTED (A) NOT DETECTED Final    Comment: CRITICAL RESULT CALLED TO, READ BACK BY AND VERIFIED WITH: PHARMD J Iran [id number] AT 1335 BY CM    Staphylococcus aureus (BCID) NOT DETECTED NOT DETECTED Final   Staphylococcus epidermidis DETECTED (A) NOT DETECTED Final    Comment: CRITICAL RESULT CALLED TO, READ BACK BY AND VERIFIED WITH: PHARMD J [place] [id number] AT 1335 BY CM    Staphylococcus lugdunensis NOT DETECTED NOT DETECTED Final   Streptococcus species NOT DETECTED NOT DETECTED Final   Streptococcus agalactiae NOT DETECTED NOT DETECTED Final   Streptococcus pneumoniae NOT DETECTED NOT DETECTED Final   Streptococcus pyogenes NOT DETECTED NOT DETECTED Final   A.calcoaceticus-baumannii NOT DETECTED NOT DETECTED  Final   Bacteroides fragilis NOT DETECTED NOT DETECTED Final   Enterobacterales NOT DETECTED NOT DETECTED Final   Enterobacter cloacae complex NOT DETECTED NOT DETECTED Final   Escherichia coli NOT DETECTED NOT DETECTED Final   Klebsiella aerogenes NOT DETECTED NOT DETECTED Final   Klebsiella oxytoca NOT DETECTED NOT DETECTED Final   Klebsiella pneumoniae NOT DETECTED NOT DETECTED Final   Proteus species NOT DETECTED NOT DETECTED Final   Salmonella species NOT DETECTED NOT DETECTED Final   Serratia marcescens NOT DETECTED NOT DETECTED Final   Haemophilus influenzae NOT  DETECTED NOT DETECTED Final   Neisseria meningitidis NOT DETECTED NOT DETECTED Final   Pseudomonas aeruginosa NOT DETECTED NOT DETECTED Final   Stenotrophomonas maltophilia NOT DETECTED NOT DETECTED Final   Candida albicans NOT DETECTED NOT DETECTED Final   Candida auris NOT DETECTED NOT DETECTED Final   Candida glabrata NOT DETECTED NOT DETECTED Final   Candida krusei NOT DETECTED NOT DETECTED Final   Candida parapsilosis NOT DETECTED NOT DETECTED Final   Candida tropicalis NOT DETECTED NOT DETECTED Final   Cryptococcus neoformans/gattii NOT DETECTED NOT DETECTED Final   Methicillin resistance mecA/C NOT DETECTED NOT DETECTED Final    Comment: Performed at [facility], [place] [address], [place], [place] [address]  MRSA Next Gen by PCR, Nasal     Status: None   Collection Time: 02/14/21 10:34 PM   Specimen: Nasal Mucosa; Nasal Swab  Result Value Ref Range Status   MRSA by PCR Next Gen NOT DETECTED NOT DETECTED Final    Comment: (NOTE) The GeneXpert MRSA Assay (FDA approved for NASAL specimens only), is one component of a comprehensive MRSA colonization surveillance program. It is not intended to diagnose MRSA infection nor to guide or monitor treatment for MRSA infections. Test performance is not FDA approved in patients less than 64 years old. Performed at [facility], [place] [address] [place],  [place] [address]          Radiology Studies: CT Head Wo Contrast  Result Date: 02/14/2021 CLINICAL DATA:  Pt c/o being dizzy when walking since Saturday. Pt also c/o headache since SaturdayPatient complains of pressure to both ears and head. Denies dizziness when laying down greater dizziness when standing or sitting up.Denies weakness to arms or legs. Patient on dialysis. EXAM: CT HEAD WITHOUT CONTRAST TECHNIQUE: Contiguous axial images were obtained from the base of the skull through the vertex without intravenous contrast. COMPARISON:  None. FINDINGS: Brain: No evidence of acute infarction, hemorrhage, hydrocephalus, extra-axial collection or mass lesion/mass effect. Vascular: No hyperdense vessel or unexpected calcification. Skull: Normal. Negative for fracture or focal lesion. Sinuses/Orbits: Globes and orbits are unremarkable. Visualized sinuses are clear. Other: None. IMPRESSION: Normal unenhanced CT scan of the brain. Electronically Signed   By: Lajean Manes M.D.   On: 02/14/2021 08:01   MR BRAIN WO CONTRAST  Result Date: 02/14/2021 CLINICAL DATA:  Dizziness EXAM: MRI HEAD WITHOUT CONTRAST TECHNIQUE: Multiplanar, multiecho pulse sequences of the brain and surrounding structures were obtained without intravenous contrast. COMPARISON:  October 2019 FINDINGS: Brain: There is no acute infarction or intracranial hemorrhage. There is no intracranial mass, mass effect, or edema. There is no hydrocephalus or extra-axial fluid collection. Ventricles and sulci are normal in size and configuration. Patchy foci of T2 hyperintensity in the supratentorial white matter nonspecific but may reflect minor chronic microvascular ischemic changes. Vascular: Major vessel flow voids at the skull base are preserved. Skull and upper cervical spine: Normal marrow signal is preserved. Sinuses/Orbits: Paranasal sinuses are aerated. Bilateral lens replacements. Other: Sella is unremarkable.  Mastoid air cells are clear.  IMPRESSION: No evidence of recent infarction, hemorrhage, or mass. Probable minor chronic microvascular ischemic changes. Electronically Signed   By: Macy Mis M.D.   On: 02/14/2021 14:40   DG Chest Port 1 View  Result Date: 02/14/2021 CLINICAL DATA:  Evaluate for sepsis. EXAM: PORTABLE CHEST 1 VIEW COMPARISON:  07/03/2020 and older exams. FINDINGS: Cardiac silhouette is normal in size. No mediastinal or hilar masses. Clear lungs.  No convincing pleural effusion.  No pneumothorax. Stable changes from right breast surgery. Skeletal structures are grossly intact. IMPRESSION: No active disease. Electronically Signed   By: Lajean Manes M.D.   On: 02/14/2021 15:58        Scheduled Meds:  acidophilus  1 capsule Oral TID   amLODipine  5 mg Oral BID   Chlorhexidine Gluconate Cloth  6 each Topical Q0600   cinacalcet  30 mg Oral Q supper   DULoxetine  30 mg Oral Daily   gabapentin  300 mg Oral QHS   heparin  5,000 Units Subcutaneous Q12H   [START ON 02/16/2021] lidocaine-prilocaine  1 application Topical Q M,W,F-HD   multivitamin  1 tablet Oral Daily   nebivolol  10 mg Oral Daily   pantoprazole  40 mg Oral Daily   Continuous Infusions:  sodium chloride 50 mL/hr at 02/14/21 1833   vancomycin Stopped (02/15/21 1315)     LOS: 1 day      Phillips Climes, MD [facility]   To contact the attending provider between 7A-7P or the covering provider during after hours 7P-7A, please log into the web site [email or web address] and access using universal [place] password for that web site. If you do not have the password, please call the hospital operator.  02/15/2021, 2:45 PM

## 2021-02-15 NOTE — Progress Notes (Signed)
Megan [name] [id number] Admission Data: 02/15/2021 5:29 AM Attending Provider: Lequita Halt, [name]  Megan [name]  Megan [name] is a 61 y.o. female patient admitted from ED awake, alert  & orientated  X 4,  Full Code, VSS - Blood pressure 139/72, pulse (!) 102, temperature 98.3 F (36.8 C), temperature source Oral, resp. rate (!) 27, height 5\' 2"  (1.575 m), weight 80.3 kg, SpO2 95 %., O2    Room Air, no c/o shortness of breath, no c/o chest pain, no distress noted. Tele # [id number]  placed and pt is currently running:sinus tachycardia.   IV site WDL:  antecubital right, condition patent and no redness with a transparent dsg that's clean dry and intact.  Allergies:  No Known Allergies   Past Medical History:  Diagnosis Date   (HFpEF) heart failure with preserved ejection fraction ([place])    a. 01/2019 Echo: EF 55-60%, mild conc LVH. DD.  Torn MV chordae.   Anemia    Atypical chest pain    a. 08/2018 MV: EF 59%, no ischemia; b. 02/2019 Cath: nonobs dzs.   Blood transfusion without reported diagnosis    Breast cancer ([place]) 10/12/2020   Cataract    ESRD (end stage renal disease) on dialysis [facility])    a. HD T, T, S   Essential hypertension, benign    GERD (gastroesophageal reflux disease)    Headache    Hemorrhoids    Mixed hyperlipidemia    Morbid obesity ([facility])    Non-obstructive CAD (coronary artery disease)    a. 02/2019 CathL LM nl, LAD 57m, LCX nl, RCA 25p, EF 55-65%.   PONV (postoperative nausea and vomiting)    S/P colonoscopy Jan 2011   Dr. Benson [name]: sessile polyp (benign lymphoid), large hemorrhoids, repeat 5-10 years   Temporal arteritis ([place])    Type 2 diabetes mellitus ([place])    Wears glasses     History:  obtained from the patient. Tobacco/alcohol: denied none  Pt orientation to unit, room and routine. Information packet given to patient/family and safety video watched.  Admission INP armband ID  verified with patient/family, and in place. SR up x 2, fall risk assessment complete with Patient and family verbalizing understanding of risks associated with falls. Pt verbalizes an understanding of how to use the call bell and to call for help before getting out of bed.  Skin, clean-dry- intact without evidence of bruising, or skin tears.  Radiation burn to right chest.  Will cont to monitor and assist as needed.  Megan Champagne, RN 02/15/2021 5:29 AM

## 2021-02-15 NOTE — Progress Notes (Signed)
PHARMACY - PHYSICIAN COMMUNICATION CRITICAL VALUE ALERT - BLOOD CULTURE IDENTIFICATION (BCID)  Megan [name] is an 60 y.o. female who presented to [facility] on 02/14/2021 with a chief complaint of weakness and dizziness  Assessment:  Staph epi found in 1 blood culture.  Suspect contamination  Name of physician (or Provider) Contacted: Dr. Waldron Labs informed  Current antibiotics: Vancomycin IV  Changes to prescribed antibiotics recommended: None at this time Patient is on recommended antibiotics - No changes needed  Results for orders placed or performed during the hospital encounter of 02/14/21  Blood Culture ID Panel (Reflexed) (Collected: 02/14/2021  5:30 PM)  Result Value Ref Range   Enterococcus faecalis NOT DETECTED NOT DETECTED   Enterococcus Faecium NOT DETECTED NOT DETECTED   Listeria monocytogenes NOT DETECTED NOT DETECTED   Staphylococcus species DETECTED (A) NOT DETECTED   Staphylococcus aureus (BCID) NOT DETECTED NOT DETECTED   Staphylococcus epidermidis DETECTED (A) NOT DETECTED   Staphylococcus lugdunensis NOT DETECTED NOT DETECTED   Streptococcus species NOT DETECTED NOT DETECTED   Streptococcus agalactiae NOT DETECTED NOT DETECTED   Streptococcus pneumoniae NOT DETECTED NOT DETECTED   Streptococcus pyogenes NOT DETECTED NOT DETECTED   A.calcoaceticus-baumannii NOT DETECTED NOT DETECTED   Bacteroides fragilis NOT DETECTED NOT DETECTED   Enterobacterales NOT DETECTED NOT DETECTED   Enterobacter cloacae complex NOT DETECTED NOT DETECTED   Escherichia coli NOT DETECTED NOT DETECTED   Klebsiella aerogenes NOT DETECTED NOT DETECTED   Klebsiella oxytoca NOT DETECTED NOT DETECTED   Klebsiella pneumoniae NOT DETECTED NOT DETECTED   Proteus species NOT DETECTED NOT DETECTED   Salmonella species NOT DETECTED NOT DETECTED   Serratia marcescens NOT DETECTED NOT DETECTED   Haemophilus influenzae NOT DETECTED NOT DETECTED   Neisseria meningitidis NOT DETECTED NOT DETECTED    Pseudomonas aeruginosa NOT DETECTED NOT DETECTED   Stenotrophomonas maltophilia NOT DETECTED NOT DETECTED   Candida albicans NOT DETECTED NOT DETECTED   Candida auris NOT DETECTED NOT DETECTED   Candida glabrata NOT DETECTED NOT DETECTED   Candida krusei NOT DETECTED NOT DETECTED   Candida parapsilosis NOT DETECTED NOT DETECTED   Candida tropicalis NOT DETECTED NOT DETECTED   Cryptococcus neoformans/gattii NOT DETECTED NOT DETECTED   Methicillin resistance mecA/C NOT DETECTED NOT DETECTED    Megan [name] 02/15/2021  1:38 PM

## 2021-02-15 NOTE — Progress Notes (Signed)
   02/14/21 2156  Assess: MEWS Score  Temp (!) 100.8 F (38.2 C)  BP (!) 141/82  Pulse Rate (!) 110  ECG Heart Rate (!) 110  Resp (!) 32  Level of Consciousness Alert  SpO2 93 %  O2 Device Room Air  Assess: MEWS Score  MEWS Temp 1  MEWS Systolic 0  MEWS Pulse 1  MEWS RR 2  MEWS LOC 0  MEWS Score 4  MEWS Score Color Red  Assess: if the MEWS score is Yellow or Red  Were vital signs taken at a resting state? Yes  Focused Assessment No change from prior assessment  Early Detection of Sepsis Score *See Row Information* High  MEWS guidelines implemented *See Row Information* Yes  Treat  MEWS Interventions Administered scheduled meds/treatments  Pain Scale 0-10  Pain Score 0  Take Vital Signs  Increase Vital Sign Frequency  Red: Q 1hr X 4 then Q 4hr X 4, if remains red, continue Q 4hrs  Escalate  MEWS: Escalate Red: discuss with charge nurse/RN and provider, consider discussing with RRT  Notify: Charge Nurse/RN  Name of Charge Nurse/RN Notified Nikki RN  Date Charge Nurse/RN Notified 02/14/21  Time Charge Nurse/RN Notified 2156

## 2021-02-15 NOTE — Progress Notes (Signed)
Nephrology Follow-Up Consult note   Assessment/Recommendations: Megan [name] is a/an 60 y.o. female with a past medical history significant for HTN, CHF, DM 2, breast cancer, ESRD, admitted for fevers and dizziness.      Outpatient dialysis: MWF   [facility] T 3: 30  EDW 73.5 kg   Optiflux 180 NRe  2K Ca 2.5     Darbepoetin 120 mcg  Weekly  Last dose 6/27 (recently incr from 100) Iron  50 mcg weekly Heparin 2300U qtx Calcitriol 0.25 mcg PO with HD  Dizziness/fevers: Concerning for meningitis.  Broad-spectrum antibiotics at this time.  Work-up and management per primary team involving neurology and infectious disease as needed  ESRD: MWF.  Missed dialysis on Monday.  Plan for dialysis today.  Volume/HTN: Blood pressure high.  Removing volume with dialysis as able.  Continue home blood pressure medications  Anemia of ESRD: Hemoglobin 10.6. Not on ESA actively outpatient from what I can tell. Check iron studies.  Secondary hyperparathyroidism: Phosphorus measured to be less than 1 yesterday but likely erroneous.  3.1 today.  Will hold binders.  Continue home calcitriol and Sensipar  Metabolic acidosis: Bicarb 17 likely associated with missing dialysis.  Plan for dialysis today.  Breast cancer: Status post bilateral mastectomy and radiation therapy.  Recommendations conveyed to primary service.    [facility] 02/15/2021 9:37 AM  ___________________________________________________________  CC: Fevers  Interval History/Subjective: Patient states she feels a lot better today.  Less headache less dizziness.  No other complaints.     Medications:  Current Facility-Administered Medications  Medication Dose Route Frequency Provider Last Rate Last Admin   0.9 %  sodium chloride infusion   Intravenous Continuous Lequita Halt, MD 50 mL/hr at 02/14/21 1833 New Bag at 02/14/21 1833   acidophilus (RISAQUAD) capsule 1 capsule  1 capsule Oral TID Varney Biles, MD   1 capsule at 02/14/21 2347   acyclovir (ZOVIRAX) 310 mg in dextrose 5 % 100 mL IVPB  310 mg Intravenous Q24H Franky Macho, [facility]   Stopped at 02/14/21 2104   amLODipine (NORVASC) tablet 5 mg  5 mg Oral BID Wynetta Fines T, MD   5 mg at 02/14/21 2347   Chlorhexidine Gluconate Cloth 2 % PADS 6 each  6 each Topical Q0600 Dwana Melena, MD   6 each at 02/15/21 0559   cinacalcet (SENSIPAR) tablet 30 mg  30 mg Oral Q supper Wynetta Fines T, MD   30 mg at 02/14/21 1901   DULoxetine (CYMBALTA) DR capsule 30 mg  30 mg Oral Daily Wynetta Fines T, MD   30 mg at 02/14/21 1901   gabapentin (NEURONTIN) capsule 300 mg  300 mg Oral QHS Wynetta Fines T, MD   300 mg at 02/14/21 2347   heparin injection 5,000 Units  5,000 Units Subcutaneous Q12H Wynetta Fines T, MD       labetalol (NORMODYNE) injection 10 mg  10 mg Intravenous Q6H PRN Wynetta Fines T, MD       lanthanum [facility]) chewable tablet 2,000 mg  2,000 mg Oral PRN Lequita Halt, MD       [START ON 02/16/2021] lidocaine-prilocaine (EMLA) cream 1 application  1 application Topical Q M,W,F-HD Wynetta Fines T, MD       meclizine (ANTIVERT) tablet 12.5 mg  12.5 mg Oral TID PRN Lequita Halt, MD       multivitamin (RENA-VIT) tablet 1 tablet  1 tablet Oral Daily Lequita Halt, MD  1 tablet at 02/14/21 1859   nebivolol (BYSTOLIC) tablet 10 mg  10 mg Oral Daily Wynetta Fines T, MD   10 mg at 02/14/21 1859   pantoprazole (PROTONIX) EC tablet 40 mg  40 mg Oral Daily Wynetta Fines T, MD   40 mg at 02/14/21 1902   vancomycin (VANCOCIN) IVPB 750 mg/150 ml premix  750 mg Intravenous Q T,Th,Sa-HD Franky Macho, RPH          Review of Systems: 10 systems reviewed and negative except per interval history/subjective  Physical Exam: Vitals:   02/15/21 0900 02/15/21 0930  BP: 130/71 130/71  Pulse: 84 87  Resp: (!) 22 (!) 23  Temp:    SpO2:     No intake/output data recorded.  Intake/Output Summary (Last 24 hours) at 02/15/2021 4403 Last data filed at 02/15/2021 4742 Gross  per 24 hour  Intake 1791.15 ml  Output --  Net 1791.15 ml   Constitutional: well-appearing, no acute distress ENMT: ears and nose without scars or lesions, MMM CV: normal rate, no edema Respiratory: clear to auscultation, normal work of breathing Gastrointestinal: soft, non-tender, no palpable masses or hernias Skin: no visible lesions or rashes Psych: alert, judgement/insight appropriate, appropriate mood and affect   Test Results I personally reviewed new and old clinical labs and radiology tests Lab Results  Component Value Date   NA 134 (L) 02/15/2021   K 4.8 02/15/2021   CL 99 02/15/2021   CO2 17 (L) 02/15/2021   BUN 87 (H) 02/15/2021   CREATININE 16.19 (H) 02/15/2021   CALCIUM 9.9 02/15/2021   ALBUMIN 4.0 01/19/2021   PHOS 3.1 02/15/2021

## 2021-02-16 DIAGNOSIS — Z20822 Contact with and (suspected) exposure to covid-19: Secondary | ICD-10-CM | POA: Diagnosis not present

## 2021-02-16 DIAGNOSIS — R509 Fever, unspecified: Secondary | ICD-10-CM

## 2021-02-16 DIAGNOSIS — R7881 Bacteremia: Secondary | ICD-10-CM

## 2021-02-16 DIAGNOSIS — D72829 Elevated white blood cell count, unspecified: Secondary | ICD-10-CM

## 2021-02-16 LAB — BASIC METABOLIC PANEL
Anion gap: 14 (ref 5–15)
BUN: 53 mg/dL — ABNORMAL HIGH (ref 6–20)
CO2: 23 mmol/L (ref 22–32)
Calcium: 9.1 mg/dL (ref 8.9–10.3)
Chloride: 95 mmol/L — ABNORMAL LOW (ref 98–111)
Creatinine, Ser: 9.68 mg/dL — ABNORMAL HIGH (ref 0.44–1.00)
GFR, Estimated: 4 mL/min — ABNORMAL LOW (ref >60)
Glucose, Bld: 175 mg/dL — ABNORMAL HIGH (ref 70–99)
Potassium: 4.1 mmol/L (ref 3.5–5.1)
Sodium: 132 mmol/L — ABNORMAL LOW (ref 135–145)

## 2021-02-16 LAB — CBC
HCT: 35 % — ABNORMAL LOW (ref 36.0–46.0)
Hemoglobin: 11.3 g/dL — ABNORMAL LOW (ref 12.0–15.0)
MCH: 32.6 pg (ref 26.0–34.0)
MCHC: 32.3 g/dL (ref 30.0–36.0)
MCV: 100.9 fL — ABNORMAL HIGH (ref 80.0–100.0)
Platelets: 232 10*3/uL (ref 150–400)
RBC: 3.47 MIL/uL — ABNORMAL LOW (ref 3.87–5.11)
RDW: 15.6 % — ABNORMAL HIGH (ref 11.5–15.5)
WBC: 14.8 10*3/uL — ABNORMAL HIGH (ref 4.0–10.5)
nRBC: 0 % (ref 0.0–0.2)

## 2021-02-16 MED ORDER — HYDROCODONE-ACETAMINOPHEN 5-325 MG PO TABS
1.0000 | ORAL_TABLET | Freq: Once | ORAL | Status: AC
  Administered 2021-02-16: 1 via ORAL
  Filled 2021-02-16: qty 1

## 2021-02-16 NOTE — Progress Notes (Signed)
[facility] for Infectious Disease  Date of Admission:  02/14/2021           Reason for visit: Follow up on C. 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 difficile antigen positive, toxin negative.  PCR follow-up also positive.  This was in the setting of recent outpatient treatment with vancomycin and fidaxomicin over the previous 6 weeks.  She endorsed increased loose bowel movements above her baseline and presented with cramping abdominal pain and fevers.  Resumed on fidaxomicin 02/15/2021 with anticipation of a prolonged taper. Fevers: Afebrile over the last 24 hours Leukocytosis: WBC this morning increased Staph epi positive blood culture: Admission blood culture positive from 1 out of 2 sets suspicious for contamination ESRD: On HD MWF History of breast cancer: Status post recent radiation therapy in the setting of recurrence  PLAN:    Continue fidaxomicin 200 mg twice daily for 5 days (7/5-7/10).  After 5 days transition to 200 mg daily every other day for 20 days (7/11-7/30) Continue enteric precautions Continue to observe off other systemic antibiotics Follow fever curve, WBC Will repeat blood cultures to ensure no high grade bacteremia Will follow   Principal Problem:   C. difficile colitis Active Problems:   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[place])   ESRD (end stage renal disease) on dialysis ([place])   Sepsis ([place])    MEDICATIONS:    Scheduled Meds:  acidophilus  1 capsule Oral TID   amLODipine  5 mg Oral BID   Chlorhexidine Gluconate Cloth  6 each Topical Q0600   cinacalcet  30 mg Oral Q supper   DULoxetine  30 mg Oral Daily   fidaxomicin  200 mg Oral BID   gabapentin  300 mg Oral QHS   heparin  5,000 Units Subcutaneous Q12H   lidocaine-prilocaine  1 application Topical Q M,W,F-HD    multivitamin  1 tablet Oral Daily   nebivolol  10 mg Oral Daily   pantoprazole  40 mg Oral Daily   Continuous Infusions: PRN Meds:.acetaminophen, labetalol, lanthanum, meclizine  SUBJECTIVE:   24 hour events:  No acute events noted overnight Afebrile, T-max 98.3 Normotensive Seen on dialysis this morning 1 out of 2 admission blood cultures positive for staph epi WBC 14.8 increased from 11.9 yesterday  Patient seen this morning on hemodialysis.  She is tolerating her session well without any issues.  Her fistula is working fine.  She denies any fevers, chills, abdominal pain.  She endorses 6 loose bowel movements over the last 24 hours.  Review of Systems  All other systems reviewed and are negative.    OBJECTIVE:   Blood pressure (!) 112/44, pulse 80, temperature 98.1 F (36.7 C), temperature source Oral, resp. rate 16, height 5\' 2"  (1.575 m), weight 72.4 kg, SpO2 97 %. Body mass index is 29.19 kg/m.  Physical Exam Constitutional:      General: She is not in acute distress.    Appearance: Normal appearance.  HENT:     Head: Normocephalic and atraumatic.  Pulmonary:     Effort: Pulmonary effort is normal. No respiratory distress.  Abdominal:     General: There is no distension.     Palpations: Abdomen is soft.     Tenderness: There is no abdominal tenderness. There is no guarding or rebound.  Musculoskeletal:     Comments: Upper extremity  fistula is being accessed for HD  Skin:    General: Skin is warm and dry.     Findings: No rash.  Neurological:     General: No focal deficit present.     Mental Status: She is alert and oriented to person, place, and time.  Psychiatric:        Mood and Affect: Mood normal.        Behavior: Behavior normal.     Lab Results: Lab Results  Component Value Date   WBC 14.8 (H) 02/16/2021   HGB 11.3 (L) 02/16/2021   HCT 35.0 (L) 02/16/2021   MCV 100.9 (H) 02/16/2021   PLT 232 02/16/2021    Lab Results  Component Value Date    NA 132 (L) 02/16/2021   K 4.1 02/16/2021   CO2 23 02/16/2021   GLUCOSE 175 (H) 02/16/2021   BUN 53 (H) 02/16/2021   CREATININE 9.68 (H) 02/16/2021   CALCIUM 9.1 02/16/2021   GFRNONAA 4 (L) 02/16/2021   GFRAA 5 (L) 11/25/2020    Lab Results  Component Value Date   ALT 22 01/19/2021   AST 14 (L) 01/19/2021   ALKPHOS 109 01/19/2021   BILITOT 0.8 01/19/2021    No results found for: CRP     Component Value Date/Time   ESRSEDRATE 87 (H) 04/23/2018 1237     I have reviewed the micro and lab results in Epic.  Imaging: MR BRAIN WO CONTRAST  Result Date: 02/14/2021 CLINICAL DATA:  Dizziness EXAM: MRI HEAD WITHOUT CONTRAST TECHNIQUE: Multiplanar, multiecho pulse sequences of the brain and surrounding structures were obtained without intravenous contrast. COMPARISON:  October 2019 FINDINGS: Brain: There is no acute infarction or intracranial hemorrhage. There is no intracranial mass, mass effect, or edema. There is no hydrocephalus or extra-axial fluid collection. Ventricles and sulci are normal in size and configuration. Patchy foci of T2 hyperintensity in the supratentorial white matter nonspecific but may reflect minor chronic microvascular ischemic changes. Vascular: Major vessel flow voids at the skull base are preserved. Skull and upper cervical spine: Normal marrow signal is preserved. Sinuses/Orbits: Paranasal sinuses are aerated. Bilateral lens replacements. Other: Sella is unremarkable.  Mastoid air cells are clear. IMPRESSION: No evidence of recent infarction, hemorrhage, or mass. Probable minor chronic microvascular ischemic changes. Electronically Signed   By: Macy Mis M.D.   On: 02/14/2021 14:40   DG Chest Port 1 View  Result Date: 02/14/2021 CLINICAL DATA:  Evaluate for sepsis. EXAM: PORTABLE CHEST 1 VIEW COMPARISON:  07/03/2020 and older exams. FINDINGS: Cardiac silhouette is normal in size. No mediastinal or hilar masses. Clear lungs.  No convincing pleural effusion.  No  pneumothorax. Stable changes from right breast surgery. Skeletal structures are grossly intact. IMPRESSION: No active disease. Electronically Signed   By: Lajean Manes M.D.   On: 02/14/2021 15:58   VAS [place] LOWER EXTREMITY VENOUS (DVT)  Result Date: 02/15/2021  Lower Venous DVT Study Patient Name:  ELSE HABERMANN  Date of Exam:   02/15/2021 Medical Rec #: [id number]            Accession #:    [id number] Date of Birth: 1960/11/12            Patient Gender: F Patient Age:   6Y Exam Location:  [facility] Procedure:      VAS [place] LOWER EXTREMITY VENOUS (DVT) Referring Phys: [id number] --------------------------------------------------------------------------------  Indications: Fever of unknown origin.  Comparison Study: No prior study Performing Technologist: Maudry Mayhew  MHA, RDMS, RVT, RDCS  Examination Guidelines: A complete evaluation includes B-mode imaging, spectral Doppler, color Doppler, and power Doppler as needed of all accessible portions of each vessel. Bilateral testing is considered an integral part of a complete examination. Limited examinations for reoccurring indications may be performed as noted. The reflux portion of the exam is performed with the patient in reverse Trendelenburg.  +---------+---------------+---------+-----------+----------+--------------+ RIGHT    CompressibilityPhasicitySpontaneityPropertiesThrombus Aging +---------+---------------+---------+-----------+----------+--------------+ CFV      Full           Yes      Yes                                 +---------+---------------+---------+-----------+----------+--------------+ SFJ      Full                                                        +---------+---------------+---------+-----------+----------+--------------+ FV Prox  Full                                                        +---------+---------------+---------+-----------+----------+--------------+ FV Mid   Full                                                         +---------+---------------+---------+-----------+----------+--------------+ FV DistalFull                                                        +---------+---------------+---------+-----------+----------+--------------+ PFV      Full                                                        +---------+---------------+---------+-----------+----------+--------------+ POP      Full           Yes      Yes                                 +---------+---------------+---------+-----------+----------+--------------+ PTV      Full                                                        +---------+---------------+---------+-----------+----------+--------------+ PERO     Full                                                        +---------+---------------+---------+-----------+----------+--------------+   +---------+---------------+---------+-----------+----------+--------------+  LEFT     CompressibilityPhasicitySpontaneityPropertiesThrombus Aging +---------+---------------+---------+-----------+----------+--------------+ CFV      Full           Yes      Yes                                 +---------+---------------+---------+-----------+----------+--------------+ SFJ      Full                                                        +---------+---------------+---------+-----------+----------+--------------+ FV Prox  Full                                                        +---------+---------------+---------+-----------+----------+--------------+ FV Mid   Full                                                        +---------+---------------+---------+-----------+----------+--------------+ FV DistalFull                                                        +---------+---------------+---------+-----------+----------+--------------+ PFV      Full                                                         +---------+---------------+---------+-----------+----------+--------------+ POP      Full           Yes      Yes                                 +---------+---------------+---------+-----------+----------+--------------+ PTV      Full                                                        +---------+---------------+---------+-----------+----------+--------------+ PERO     Full                                                        +---------+---------------+---------+-----------+----------+--------------+     Summary: RIGHT: - There is no evidence of deep vein thrombosis in the lower extremity.  - No cystic structure found in the popliteal fossa.  LEFT: - There is no evidence of deep vein thrombosis in the lower extremity.  - No  cystic structure found in the popliteal fossa.  *See table(s) above for measurements and observations. Electronically signed by Ruta Hinds MD on 02/15/2021 at 3:52:13 PM.    Final      Imaging independently reviewed in Epic.    [facility] for Infectious Disease [facility] [phone] pager 02/16/2021, 10:09 AM

## 2021-02-16 NOTE — TOC Benefit Eligibility Note (Addendum)
Patient Teacher, [name] completed.    The patient is currently admitted and upon discharge could be taking Dificid 200 mg.  The current 10 day co-pay is, $38.00.   The patient is insured through [employer] [place] Patient Advocate Specialist [place] Team Direct Number: [phone]  Fax: [phone]

## 2021-02-16 NOTE — Progress Notes (Signed)
Nephrology Follow-Up Consult note   Assessment/Recommendations: Megan [name] is a/an 60 y.o. female with a past medical history significant for HTN, CHF, DM 2, breast cancer, ESRD, admitted for fevers and dizziness.      Outpatient dialysis: MWF   RKC T 3: 30  EDW 73.5 kg   Optiflux 180 NRe  2K Ca 2.5     Darbepoetin 120 mcg  Weekly  Last dose 6/27 (recently incr from 100) Iron  50 mcg weekly Heparin 2300U qtx Calcitriol 0.25 mcg PO with HD  Dizziness/fevers/C. difficile: Initial concern for meningitis.  Now felt to be recurrence of C. difficile.  On Pradaxa mycin.  Management per primary and ID  ESRD: MWF.  Missed dialysis on Monday.  Tolerated dialysis yesterday.  Resume regular schedule today for dialysis.  Volume/HTN: Continue current medications.  Blood pressure improved.  Anemia of ESRD: Hemoglobin 11.3.  Iron sat 15 but ferritin 1742.  No iron.  No ESA.  Secondary hyperparathyroidism: Holding binders given normal phosphorus.  Continue home calcitriol and Sensipar  Metabolic acidosis: Resolved with dialysis  Breast cancer: Status post bilateral mastectomy and radiation therapy.  Recommendations conveyed to primary service.    [facility] 02/16/2021 10:00 AM  ___________________________________________________________  CC: Fevers  Interval History/Subjective: Patient states that she continues to feel better.  No other complaints.   Medications:  Current Facility-Administered Medications  Medication Dose Route Frequency Provider Last Rate Last Admin   acetaminophen (TYLENOL) tablet 650 mg  650 mg Oral Q6H PRN Elgergawy, Silver Huguenin, MD   650 mg at 02/15/21 1549   acidophilus (RISAQUAD) capsule 1 capsule  1 capsule Oral TID Varney Biles, MD   1 capsule at 02/15/21 2108   amLODipine (NORVASC) tablet 5 mg  5 mg Oral BID Wynetta Fines T, MD   5 mg at 02/15/21 2108   Chlorhexidine Gluconate Cloth 2 % PADS 6 each  6 each Topical Q0600  Dwana Melena, MD   6 each at 02/16/21 0553   cinacalcet (SENSIPAR) tablet 30 mg  30 mg Oral Q supper Wynetta Fines T, MD   30 mg at 02/15/21 1734   DULoxetine (CYMBALTA) DR capsule 30 mg  30 mg Oral Daily Wynetta Fines T, MD   30 mg at 02/15/21 1314   fidaxomicin (DIFICID) tablet 200 mg  200 mg Oral BID [place] Circle, FNP   200 mg at 02/15/21 2108   gabapentin (NEURONTIN) capsule 300 mg  300 mg Oral QHS Wynetta Fines T, MD   300 mg at 02/15/21 2108   heparin injection 5,000 Units  5,000 Units Subcutaneous Q12H Wynetta Fines T, MD   5,000 Units at 02/15/21 2107   labetalol (NORMODYNE) injection 10 mg  10 mg Intravenous Q6H PRN Lequita Halt, MD       lanthanum [facility]) chewable tablet 2,000 mg  2,000 mg Oral PRN Lequita Halt, MD       lidocaine-prilocaine (EMLA) cream 1 application  1 application Topical Q M,W,F-HD Wynetta Fines T, MD       meclizine (ANTIVERT) tablet 12.5 mg  12.5 mg Oral TID PRN Lequita Halt, MD       multivitamin (RENA-VIT) tablet 1 tablet  1 tablet Oral Daily Wynetta Fines T, MD   1 tablet at 02/15/21 2108   nebivolol (BYSTOLIC) tablet 10 mg  10 mg Oral Daily Wynetta Fines T, MD   10 mg at 02/15/21 1314   pantoprazole (PROTONIX) EC tablet 40 mg  40 mg Oral Daily Wynetta Fines T, MD   40 mg at 02/15/21 1314      Review of Systems: 10 systems reviewed and negative except per interval history/subjective  Physical Exam: Vitals:   02/16/21 0900 02/16/21 0930  BP: 129/78 111/74  Pulse: 79 81  Resp: 16 16  Temp:    SpO2:     No intake/output data recorded.  Intake/Output Summary (Last 24 hours) at 02/16/2021 1000 Last data filed at 02/15/2021 2100 Gross per 24 hour  Intake 152.5 ml  Output 3000 ml  Net -2847.5 ml   Constitutional: well-appearing, no acute distress ENMT: ears and nose without scars or lesions, MMM CV: normal rate, no edema Respiratory: Bilateral chest rise, normal work of breathing Gastrointestinal: soft, non-tender, no palpable masses or hernias Skin: no  visible lesions or rashes Psych: alert, judgement/insight appropriate, appropriate mood and affect   Test Results I personally reviewed new and old clinical labs and radiology tests Lab Results  Component Value Date   NA 132 (L) 02/16/2021   K 4.1 02/16/2021   CL 95 (L) 02/16/2021   CO2 23 02/16/2021   BUN 53 (H) 02/16/2021   CREATININE 9.68 (H) 02/16/2021   CALCIUM 9.1 02/16/2021   ALBUMIN 4.0 01/19/2021   PHOS 3.1 02/15/2021

## 2021-02-16 NOTE — Progress Notes (Signed)
PROGRESS NOTE    Megan [name]  BMW:[id number] DOB: 1961/03/06 DOA: 02/14/2021 PCP: [name], MD    No chief complaint on file.   Brief narrative:    Megan [name] is a 60 y.o. 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  Work-up was significant for fever, SIRS, apparently on meningitis coverage given altered mental status, but her negative for infectious process, MRI with no acute process as well, she is ESRD, seen by renal, hemodialysis 7/5, as well followed by ID.     Assessment & Plan:    SIRS   -  Due to of recurrent C. Difficile, no other clear source of infection, venous duplex lower extremities negative for DVT, she has been seen by ID.  Currently on fidaxomicin.  1 out of 2 blood cultures growing staph epidermis likely contamination.  Clinically appears nontoxic and will be monitored closely.     Vertigo -Stroke ruled out.  MRI brain with no acute findings   ESRD on HD -Renal consulted, HD per schedule.   HTN -Continue with amlodipine and Bystolic, blood pressure is acceptable, resume hydralazine and Cozaar if started to increase.     Breast cancer -Status post bilateral mastectomy and radiation therapy.  Outpatient oncology follow-up.     DVT prophylaxis: Heparin Code Status: Full Family Communication:  Disposition:   Status is: Inpatient  Remains inpatient appropriate because:IV treatments appropriate due to intensity of illness or inability to take PO  Dispo: The patient is from: Home              Anticipated d/c is to: Home              Patient currently is not medically stable to d/c.   Difficult to place patient No   Consultants:  Infectious disease,  Renal  Subjective:  Patient in bed, appears comfortable, denies any headache, no fever, no chest pain or pressure, no shortness of breath , no abdominal pain. No new focal weakness.   Objective: Vitals:   02/16/21 0930 02/16/21 1000 02/16/21 1030 02/16/21 1100  BP: 111/74 112/74 119/72 107/69  Pulse: 81 80 79 85  Resp: 16 16    Temp:      TempSrc:      SpO2:      Weight:      Height:        Intake/Output Summary (Last 24 hours) at 02/16/2021 1133 Last data filed at 02/15/2021 2100 Gross per 24 hour  Intake 152.5 ml  Output 3000 ml  Net -2847.5 ml   Filed Weights   02/15/21 0809 02/15/21 1245 02/16/21 0837  Weight: 78 kg 75 kg 72.4 kg    Examination:  Awake Alert, No new F.N deficits, Normal affect [place].AT,PERRAL Supple Neck,No JVD, No cervical lymphadenopathy appriciated.  Symmetrical Chest wall movement, Good air movement bilaterally, CTAB RRR,No Gallops, Rubs or new Murmurs, No Parasternal Heave +ve B.Sounds, Abd Soft, No tenderness, No organomegaly appriciated, No rebound - guarding or rigidity. No Cyanosis, Clubbing or edema, No new Rash or bruise    Data Reviewed: I have personally reviewed following labs and imaging studies  CBC: Recent Labs  Lab 02/14/21 0843 02/14/21 1720 02/15/21 0139 02/16/21 0154  WBC 9.8 10.8* 11.9* 14.8*  NEUTROABS  --  7.3  --   --  HGB 11.7* 11.0* 10.6* 11.3*  HCT 37.5 34.5* 33.6* 35.0*  MCV 106.5* 103.6* 104.3* 100.9*  PLT 238 228 208 382    Basic Metabolic Panel: Recent Labs  Lab 02/14/21 0843 02/14/21 1730 02/15/21 0139 02/16/21 0154  NA 135  --  134* 132*  K 4.7  --  4.8 4.1  CL 99  --  99 95*  CO2 22  --  17* 23  GLUCOSE 109*  --  168* 175*  BUN 80*  --  87* 53*  CREATININE 14.98*  --  16.19* 9.68*  CALCIUM 10.0  --  9.9 9.1  PHOS  --  1.0* 3.1  --     GFR: Estimated Creatinine Clearance: 5.9 mL/min (A) (by C-G formula based on SCr of 9.68 mg/dL (H)).  Liver Function Tests: No results for input(s): AST,  ALT, ALKPHOS, BILITOT, PROT, ALBUMIN in the last 168 hours.  CBG: No results for input(s): GLUCAP in the last 168 hours.   Recent Results (from the past 240 hour(s))  Resp Panel by RT-PCR (Flu A&B, Covid) Nasopharyngeal Swab     Status: None   Collection Time: 02/14/21 11:27 AM   Specimen: Nasopharyngeal Swab; Nasopharyngeal(NP) swabs in vial transport medium  Result Value Ref Range Status   SARS Coronavirus 2 by RT PCR NEGATIVE NEGATIVE Final    Comment: (NOTE) SARS-CoV-2 target nucleic acids are NOT DETECTED.  The SARS-CoV-2 RNA is generally detectable in upper respiratory specimens during the acute phase of infection. The lowest concentration of SARS-CoV-2 viral copies this assay can detect is 138 copies/mL. A negative result does not preclude SARS-Cov-2 infection and should not be used as the sole basis for treatment or other patient management decisions. A negative result may occur with  improper specimen collection/handling, submission of specimen other than nasopharyngeal swab, presence of viral mutation(s) within the areas targeted by this assay, and inadequate number of viral copies(<138 copies/mL). A negative result must be combined with clinical observations, patient history, and epidemiological information. The expected result is Negative.  Fact Sheet for Patients:  EntrepreneurPulse.com.au  Fact Sheet for Healthcare Providers:  IncredibleEmployment.be  This test is no t yet approved or cleared by the [place] FDA and  has been authorized for detection and/or diagnosis of SARS-CoV-2 by FDA under an Emergency Use Authorization (EUA). This EUA will remain  in effect (meaning this test can be used) for the duration of the COVID-19 declaration under Section 564(b)(1) of the Act, 21 U.S.C.section 360bbb-3(b)(1), unless the authorization is terminated  or revoked sooner.       Influenza A by PCR NEGATIVE NEGATIVE Final    Influenza B by PCR NEGATIVE NEGATIVE Final    Comment: (NOTE) The Xpert Xpress SARS-CoV-2/FLU/RSV plus assay is intended as an aid in the diagnosis of influenza from Nasopharyngeal swab specimens and should not be used as a sole basis for treatment. Nasal washings and aspirates are unacceptable for Xpert Xpress SARS-CoV-2/FLU/RSV testing.  Fact Sheet for Patients: EntrepreneurPulse.com.au  Fact Sheet for Healthcare Providers: IncredibleEmployment.be  This test is not yet approved or cleared by the [place] FDA and has been authorized for detection and/or diagnosis of SARS-CoV-2 by FDA under an Emergency Use Authorization (EUA). This EUA will remain in effect (meaning this test can be used) for the duration of the COVID-19 declaration under Section 564(b)(1) of the Act, 21 U.S.C. section 360bbb-3(b)(1), unless the authorization is terminated or revoked.  Performed at [facility], [address] [place], [place] [address]  CSF culture w Gram Stain     Status: None (Preliminary result)   Collection Time: 02/14/21  5:24 PM   Specimen: CSF; Cerebrospinal Fluid  Result Value Ref Range Status   Specimen Description CSF  Final   Special Requests NONE  Final   Gram Stain   Final    WBC PRESENT, PREDOMINANTLY MONONUCLEAR NO ORGANISMS SEEN CYTOSPIN SMEAR    Culture   Final    NO GROWTH 2 DAYS Performed at [facility], [place] [address] [place], [place] [address]    Report Status PENDING  Incomplete  Blood Culture (routine x 2)     Status: Abnormal (Preliminary result)   Collection Time: 02/14/21  5:30 PM   Specimen: BLOOD  Result Value Ref Range Status   Specimen Description BLOOD RIGHT ANTECUBITAL  Final   Special Requests   Final    BOTTLES DRAWN AEROBIC AND ANAEROBIC Blood Culture adequate volume   Culture  Setup Time   Final    GRAM POSITIVE COCCI IN BOTH AEROBIC AND ANAEROBIC BOTTLES CRITICAL RESULT CALLED TO, READ BACK BY AND  VERIFIED WITH: PHARMD J [place] [id number] AT 69 BY CM    Culture (A)  Final    STAPHYLOCOCCUS HOMINIS THE SIGNIFICANCE OF ISOLATING THIS ORGANISM FROM A SINGLE SET OF BLOOD CULTURES WHEN MULTIPLE SETS ARE DRAWN IS UNCERTAIN. PLEASE NOTIFY THE MICROBIOLOGY DEPARTMENT WITHIN ONE WEEK IF SPECIATION AND SENSITIVITIES ARE REQUIRED. CULTURE REINCUBATED FOR BETTER GROWTH Performed at [facility], [place] [address] [place], [place] [address]    Report Status PENDING  Incomplete  Blood Culture ID Panel (Reflexed)     Status: Abnormal   Collection Time: 02/14/21  5:30 PM  Result Value Ref Range Status   Enterococcus faecalis NOT DETECTED NOT DETECTED Final   Enterococcus Faecium NOT DETECTED NOT DETECTED Final   Listeria monocytogenes NOT DETECTED NOT DETECTED Final   Staphylococcus species DETECTED (A) NOT DETECTED Final    Comment: CRITICAL RESULT CALLED TO, READ BACK BY AND VERIFIED WITH: PHARMD J [place] [id number] AT 1335 BY CM    Staphylococcus aureus (BCID) NOT DETECTED NOT DETECTED Final   Staphylococcus epidermidis DETECTED (A) NOT DETECTED Final    Comment: CRITICAL RESULT CALLED TO, READ BACK BY AND VERIFIED WITH: PHARMD J [place] [id number] AT 1335 BY CM    Staphylococcus lugdunensis NOT DETECTED NOT DETECTED Final   Streptococcus species NOT DETECTED NOT DETECTED Final   Streptococcus agalactiae NOT DETECTED NOT DETECTED Final   Streptococcus pneumoniae NOT DETECTED NOT DETECTED Final   Streptococcus pyogenes NOT DETECTED NOT DETECTED Final   A.calcoaceticus-baumannii NOT DETECTED NOT DETECTED Final   Bacteroides fragilis NOT DETECTED NOT DETECTED Final   Enterobacterales NOT DETECTED NOT DETECTED Final   Enterobacter cloacae complex NOT DETECTED NOT DETECTED Final   Escherichia coli NOT DETECTED NOT DETECTED Final   Klebsiella aerogenes NOT DETECTED NOT DETECTED Final   Klebsiella oxytoca NOT DETECTED NOT DETECTED Final   Klebsiella pneumoniae NOT DETECTED NOT DETECTED Final    Proteus species NOT DETECTED NOT DETECTED Final   Salmonella species NOT DETECTED NOT DETECTED Final   Serratia marcescens NOT DETECTED NOT DETECTED Final   Haemophilus influenzae NOT DETECTED NOT DETECTED Final   Neisseria meningitidis NOT DETECTED NOT DETECTED Final   Pseudomonas aeruginosa NOT DETECTED NOT DETECTED Final   Stenotrophomonas maltophilia NOT DETECTED NOT DETECTED Final   Candida albicans NOT DETECTED NOT DETECTED Final   Candida auris NOT DETECTED NOT DETECTED Final  Candida glabrata NOT DETECTED NOT DETECTED Final   Candida krusei NOT DETECTED NOT DETECTED Final   Candida parapsilosis NOT DETECTED NOT DETECTED Final   Candida tropicalis NOT DETECTED NOT DETECTED Final   Cryptococcus neoformans/gattii NOT DETECTED NOT DETECTED Final   Methicillin resistance mecA/C NOT DETECTED NOT DETECTED Final    Comment: Performed at [facility], [place] [address] [place], [place] [address]  MRSA Next Gen by PCR, Nasal     Status: None   Collection Time: 02/14/21 10:34 PM   Specimen: Nasal Mucosa; Nasal Swab  Result Value Ref Range Status   MRSA by PCR Next Gen NOT DETECTED NOT DETECTED Final    Comment: (NOTE) The GeneXpert MRSA Assay (FDA approved for NASAL specimens only), is one component of a comprehensive MRSA colonization surveillance program. It is not intended to diagnose MRSA infection nor to guide or monitor treatment for MRSA infections. Test performance is not FDA approved in patients less than 59 years old. Performed at [facility], [place] [address], [place], [place] [address]   Blood Culture (routine x 2)     Status: None (Preliminary result)   Collection Time: 02/14/21 11:14 PM   Specimen: BLOOD  Result Value Ref Range Status   Specimen Description BLOOD RIGHT ARM  Final   Special Requests   Final    BOTTLES DRAWN AEROBIC AND ANAEROBIC Blood Culture results may not be optimal due to an inadequate volume of blood received in culture bottles   Culture    Final    NO GROWTH 1 DAY Performed at [facility], [place] [address], [place], [place] [address]    Report Status PENDING  Incomplete         Radiology Studies: MR BRAIN WO CONTRAST  Result Date: 02/14/2021 CLINICAL DATA:  Dizziness EXAM: MRI HEAD WITHOUT CONTRAST TECHNIQUE: Multiplanar, multiecho pulse sequences of the brain and surrounding structures were obtained without intravenous contrast. COMPARISON:  October 2019 FINDINGS: Brain: There is no acute infarction or intracranial hemorrhage. There is no intracranial mass, mass effect, or edema. There is no hydrocephalus or extra-axial fluid collection. Ventricles and sulci are normal in size and configuration. Patchy foci of T2 hyperintensity in the supratentorial white matter nonspecific but may reflect minor chronic microvascular ischemic changes. Vascular: Major vessel flow voids at the skull base are preserved. Skull and upper cervical spine: Normal marrow signal is preserved. Sinuses/Orbits: Paranasal sinuses are aerated. Bilateral lens replacements. Other: Sella is unremarkable.  Mastoid air cells are clear. IMPRESSION: No evidence of recent infarction, hemorrhage, or mass. Probable minor chronic microvascular ischemic changes. Electronically Signed   By: Macy Mis M.D.   On: 02/14/2021 14:40   DG Chest Port 1 View  Result Date: 02/14/2021 CLINICAL DATA:  Evaluate for sepsis. EXAM: PORTABLE CHEST 1 VIEW COMPARISON:  07/03/2020 and older exams. FINDINGS: Cardiac silhouette is normal in size. No mediastinal or hilar masses. Clear lungs.  No convincing pleural effusion.  No pneumothorax. Stable changes from right breast surgery. Skeletal structures are grossly intact. IMPRESSION: No active disease. Electronically Signed   By: Lajean Manes M.D.   On: 02/14/2021 15:58   VAS [place] LOWER EXTREMITY VENOUS (DVT)  Result Date: 02/15/2021  Lower Venous DVT Study Patient Name:  Megan [name]  Date of Exam:   02/15/2021 Medical Rec #:  [id number]            Accession #:    [id number] Date of Birth: 1961/06/21  Patient Gender: F Patient Age:   74Y Exam Location:  [facility] Procedure:      VAS [place] LOWER EXTREMITY VENOUS (DVT) Referring Phys: [id number] --------------------------------------------------------------------------------  Indications: Fever of unknown origin.  Comparison Study: No prior study Performing Technologist: Maudry Mayhew MHA, RDMS, RVT, RDCS  Examination Guidelines: A complete evaluation includes B-mode imaging, spectral Doppler, color Doppler, and power Doppler as needed of all accessible portions of each vessel. Bilateral testing is considered an integral part of a complete examination. Limited examinations for reoccurring indications may be performed as noted. The reflux portion of the exam is performed with the patient in reverse Trendelenburg.  +---------+---------------+---------+-----------+----------+--------------+ RIGHT    CompressibilityPhasicitySpontaneityPropertiesThrombus Aging +---------+---------------+---------+-----------+----------+--------------+ CFV      Full           Yes      Yes                                 +---------+---------------+---------+-----------+----------+--------------+ SFJ      Full                                                        +---------+---------------+---------+-----------+----------+--------------+ FV Prox  Full                                                        +---------+---------------+---------+-----------+----------+--------------+ FV Mid   Full                                                        +---------+---------------+---------+-----------+----------+--------------+ FV DistalFull                                                        +---------+---------------+---------+-----------+----------+--------------+ PFV      Full                                                         +---------+---------------+---------+-----------+----------+--------------+ POP      Full           Yes      Yes                                 +---------+---------------+---------+-----------+----------+--------------+ PTV      Full                                                        +---------+---------------+---------+-----------+----------+--------------+  PERO     Full                                                        +---------+---------------+---------+-----------+----------+--------------+   +---------+---------------+---------+-----------+----------+--------------+ LEFT     CompressibilityPhasicitySpontaneityPropertiesThrombus Aging +---------+---------------+---------+-----------+----------+--------------+ CFV      Full           Yes      Yes                                 +---------+---------------+---------+-----------+----------+--------------+ SFJ      Full                                                        +---------+---------------+---------+-----------+----------+--------------+ FV Prox  Full                                                        +---------+---------------+---------+-----------+----------+--------------+ FV Mid   Full                                                        +---------+---------------+---------+-----------+----------+--------------+ FV DistalFull                                                        +---------+---------------+---------+-----------+----------+--------------+ PFV      Full                                                        +---------+---------------+---------+-----------+----------+--------------+ POP      Full           Yes      Yes                                 +---------+---------------+---------+-----------+----------+--------------+ PTV      Full                                                         +---------+---------------+---------+-----------+----------+--------------+ PERO     Full                                                        +---------+---------------+---------+-----------+----------+--------------+  Summary: RIGHT: - There is no evidence of deep vein thrombosis in the lower extremity.  - No cystic structure found in the popliteal fossa.  LEFT: - There is no evidence of deep vein thrombosis in the lower extremity.  - No cystic structure found in the popliteal fossa.  *See table(s) above for measurements and observations. Electronically signed by Ruta Hinds MD on 02/15/2021 at 3:52:13 PM.    Final      Scheduled Meds:  acidophilus  1 capsule Oral TID   amLODipine  5 mg Oral BID   Chlorhexidine Gluconate Cloth  6 each Topical Q0600   cinacalcet  30 mg Oral Q supper   DULoxetine  30 mg Oral Daily   fidaxomicin  200 mg Oral BID   gabapentin  300 mg Oral QHS   heparin  5,000 Units Subcutaneous Q12H   lidocaine-prilocaine  1 application Topical Q M,W,F-HD   multivitamin  1 tablet Oral Daily   nebivolol  10 mg Oral Daily   pantoprazole  40 mg Oral Daily   Continuous Infusions:     LOS: 2 days   Signature  Lala Lund M.D on 02/16/2021 at 11:33 AM   -  To page go to [email or web address]

## 2021-02-17 ENCOUNTER — Other Ambulatory Visit (HOSPITAL_COMMUNITY): Payer: Self-pay

## 2021-02-17 LAB — CULTURE, BLOOD (ROUTINE X 2): Special Requests: ADEQUATE

## 2021-02-17 MED ORDER — FIDAXOMICIN 200 MG PO TABS
200.0000 mg | ORAL_TABLET | ORAL | Status: DC
  Administered 2021-02-21: 200 mg via ORAL
  Filled 2021-02-17: qty 1

## 2021-02-17 MED ORDER — FIDAXOMICIN 200 MG PO TABS
200.0000 mg | ORAL_TABLET | Freq: Two times a day (BID) | ORAL | 0 refills | Status: AC
  Filled 2021-02-17: qty 20, 10d supply, fill #0

## 2021-02-17 MED ORDER — FIDAXOMICIN 200 MG PO TABS
200.0000 mg | ORAL_TABLET | ORAL | 0 refills | Status: DC
  Filled 2021-02-17: qty 10, 20d supply, fill #0

## 2021-02-17 MED ORDER — LACTATED RINGERS IV BOLUS
500.0000 mL | Freq: Once | INTRAVENOUS | Status: AC
  Administered 2021-02-17: 500 mL via INTRAVENOUS

## 2021-02-17 NOTE — Progress Notes (Signed)
[facility]  Date of Admission:  02/14/2021     Total days of antibiotics 4         ASSESSMENT:  Ms. Tarkington continues to have some dizziness and has had 3 bowel movements this morning. Overall feeling improved. Discussed plan of care to continue Fidaxomicin taper with first 200 mg twice daily for 5 days then 200 mg every other day for 20 days. Recommend using symptom improvement as test of cure as opposed to additional stool culture/C. Difficile testing. If continues to have symptoms may need to consider administration of the monoclonal antibody Zinplava as outpatient. For now continue with Fidaxomicin taper. Follow up with primary care following hospitalization.   PLAN:  Continue fidaxomicin taper until complete Recommend using symptom improvement for test of cure. Remaining medical care per primary team.   ID will sign off. Please re-consult if needed.   Principal Problem:   C. 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 Having some dizziness. Has had 3 bowel movements thus far today and improved from previous. There is some form to the stool although primarily loose/liquid. No fevers or chills.   No Known Allergies   Review of Systems: Review of Systems  Constitutional:  Negative for chills, fever and weight loss.   Respiratory:  Negative for cough, shortness of breath and wheezing.   Cardiovascular:  Negative for chest pain and leg swelling.  Gastrointestinal:  Positive for diarrhea. Negative for abdominal pain, constipation, nausea and vomiting.  Skin:  Negative for rash.  Neurological:  Positive for dizziness.     OBJECTIVE: Vitals:   02/16/21 2000 02/17/21 0035 02/17/21 0400 02/17/21 0816  BP: 108/67 114/73 105/64 (!) 86/53  Pulse: 78 78 68 79  Resp: 18 19 20 16   Temp: 98.2 F (36.8 C) 98.2 F (36.8 C) 97.8 F (36.6 C) (!) 97.5 F (36.4 C)  TempSrc: Oral Oral Axillary Oral  SpO2:  99% 98%   Weight:      Height:       Body mass index is 28.23 kg/m.  Physical Exam Constitutional:      General: She is not in acute distress.    Appearance: She is well-developed.     Comments: Lying in bed with head of bed elevated; pleasant.   Cardiovascular:     Rate and Rhythm: Normal rate and regular rhythm.     Heart sounds: Normal heart sounds.  Pulmonary:     Effort: Pulmonary effort is normal.     Breath sounds: Normal breath sounds.  Abdominal:     General: Bowel sounds are normal. There is no distension.     Palpations: There is no mass.     Tenderness: There is no abdominal  tenderness.  Skin:    General: Skin is warm and dry.  Neurological:     Mental Status: She is alert and oriented to person, place, and time.  Psychiatric:        Mood and Affect: Mood normal.    Lab Results Lab Results  Component Value Date   WBC 14.8 (H) 02/16/2021   HGB 11.3 (L) 02/16/2021   HCT 35.0 (L) 02/16/2021   MCV 100.9 (H) 02/16/2021   PLT 232 02/16/2021    Lab Results  Component Value Date   CREATININE 9.68 (H) 02/16/2021   BUN 53 (H) 02/16/2021   NA 132 (L) 02/16/2021   K 4.1 02/16/2021   CL 95 (L) 02/16/2021   CO2 23 02/16/2021    Lab Results  Component Value Date   ALT 22 01/19/2021   AST 14 (L) 01/19/2021   ALKPHOS 109 01/19/2021   BILITOT 0.8 01/19/2021      Microbiology: Recent Results (from the past 240 hour(s))  Resp Panel by RT-PCR (Flu A&B, Covid) Nasopharyngeal Swab     Status: None   Collection Time: 02/14/21 11:27 AM   Specimen: Nasopharyngeal Swab; Nasopharyngeal(NP) swabs in vial transport medium  Result Value Ref Range Status   SARS Coronavirus 2 by RT PCR NEGATIVE NEGATIVE Final    Comment: (NOTE) SARS-CoV-2 target nucleic acids are NOT DETECTED.  The SARS-CoV-2 RNA is generally detectable in upper respiratory specimens during the acute phase of infection. The lowest concentration of SARS-CoV-2 viral copies this assay can detect is 138 copies/mL. A negative result does not preclude SARS-Cov-2 infection and should not be used as the sole basis for treatment or other patient management decisions. A negative result may occur with  improper specimen collection/handling, submission of specimen other than nasopharyngeal swab, presence of viral mutation(s) within the areas targeted by this assay, and inadequate number of viral copies(<138 copies/mL). A negative result must be combined with clinical observations, patient history, and epidemiological information. The expected result is Negative.  Fact Sheet for Patients:  EntrepreneurPulse.com.au  Fact Sheet for Healthcare Providers:  IncredibleEmployment.be  This test is no t yet approved or cleared by the [place] FDA and  has been authorized for detection and/or diagnosis of SARS-CoV-2 by FDA under an Emergency Use Authorization (EUA). This EUA will remain  in effect (meaning this test can be used) for the duration of the COVID-19 declaration under Section 564(b)(1) of the Act, 21 U.S.C.section 360bbb-3(b)(1), unless the authorization is terminated  or revoked sooner.       Influenza A by PCR NEGATIVE NEGATIVE Final   Influenza B by PCR NEGATIVE NEGATIVE Final    Comment: (NOTE) The Xpert Xpress SARS-CoV-2/FLU/RSV plus assay is  intended as an aid in the diagnosis of influenza from Nasopharyngeal swab specimens and should not be used as a sole basis for treatment. Nasal washings and aspirates are unacceptable for Xpert Xpress SARS-CoV-2/FLU/RSV testing.  Fact Sheet for Patients: EntrepreneurPulse.com.au  Fact Sheet for Healthcare Providers: IncredibleEmployment.be  This test is not yet approved or cleared by the [place] FDA and has been authorized for detection and/or diagnosis of SARS-CoV-2 by FDA under an Emergency Use Authorization (EUA). This EUA will remain in effect (meaning this test can be used) for the duration of the COVID-19 declaration under Section 564(b)(1) of the Act, 21 U.S.C. section 360bbb-3(b)(1), unless the authorization is terminated or revoked.  Performed at [facility], [address], [place], [place] [address]   CSF culture w Gram Stain  Status: None (Preliminary result)   Collection Time: 02/14/21  5:24 PM   Specimen: CSF; Cerebrospinal Fluid  Result Value Ref Range Status   Specimen Description CSF  Final   Special Requests NONE  Final   Gram Stain   Final    WBC PRESENT, PREDOMINANTLY MONONUCLEAR NO ORGANISMS SEEN CYTOSPIN SMEAR    Culture   Final    NO GROWTH 3 DAYS Performed at [facility], [place] [address], [place], [place] [address]    Report Status PENDING  Incomplete  Blood Culture (routine x 2)     Status: Abnormal (Preliminary result)   Collection Time: 02/14/21  5:30 PM   Specimen: BLOOD  Result Value Ref Range Status   Specimen Description BLOOD RIGHT ANTECUBITAL  Final   Special Requests   Final    BOTTLES DRAWN AEROBIC AND ANAEROBIC Blood Culture adequate volume   Culture  Setup Time   Final    GRAM POSITIVE COCCI IN BOTH AEROBIC AND ANAEROBIC BOTTLES CRITICAL RESULT CALLED TO, READ BACK BY AND VERIFIED WITH: PHARMD J [place] [id number] AT 17 BY CM    Culture (A)  Final    STAPHYLOCOCCUS HOMINIS THE  SIGNIFICANCE OF ISOLATING THIS ORGANISM FROM A SINGLE SET OF BLOOD CULTURES WHEN MULTIPLE SETS ARE DRAWN IS UNCERTAIN. PLEASE NOTIFY THE MICROBIOLOGY DEPARTMENT WITHIN ONE WEEK IF SPECIATION AND SENSITIVITIES ARE REQUIRED. CULTURE REINCUBATED FOR BETTER GROWTH Performed at [facility], [place] [address], [place], [place] [address]    Report Status PENDING  Incomplete  Blood Culture ID Panel (Reflexed)     Status: Abnormal   Collection Time: 02/14/21  5:30 PM  Result Value Ref Range Status   Enterococcus faecalis NOT DETECTED NOT DETECTED Final   Enterococcus Faecium NOT DETECTED NOT DETECTED Final   Listeria monocytogenes NOT DETECTED NOT DETECTED Final   Staphylococcus species DETECTED (A) NOT DETECTED Final    Comment: CRITICAL RESULT CALLED TO, READ BACK BY AND VERIFIED WITH: PHARMD J [place] [id number] AT 1335 BY CM    Staphylococcus aureus (BCID) NOT DETECTED NOT DETECTED Final   Staphylococcus epidermidis DETECTED (A) NOT DETECTED Final    Comment: CRITICAL RESULT CALLED TO, READ BACK BY AND VERIFIED WITH: PHARMD J [place] [id number] AT 1335 BY CM    Staphylococcus lugdunensis NOT DETECTED NOT DETECTED Final   Streptococcus species NOT DETECTED NOT DETECTED Final   Streptococcus agalactiae NOT DETECTED NOT DETECTED Final   Streptococcus pneumoniae NOT DETECTED NOT DETECTED Final   Streptococcus pyogenes NOT DETECTED NOT DETECTED Final   A.calcoaceticus-baumannii NOT DETECTED NOT DETECTED Final   Bacteroides fragilis NOT DETECTED NOT DETECTED Final   Enterobacterales NOT DETECTED NOT DETECTED Final   Enterobacter cloacae complex NOT DETECTED NOT DETECTED Final   Escherichia coli NOT DETECTED NOT DETECTED Final   Klebsiella aerogenes NOT DETECTED NOT DETECTED Final   Klebsiella oxytoca NOT DETECTED NOT DETECTED Final   Klebsiella pneumoniae NOT DETECTED NOT DETECTED Final   Proteus species NOT DETECTED NOT DETECTED Final   Salmonella species NOT DETECTED NOT DETECTED Final   Serratia  marcescens NOT DETECTED NOT DETECTED Final   Haemophilus influenzae NOT DETECTED NOT DETECTED Final   Neisseria meningitidis NOT DETECTED NOT DETECTED Final   Pseudomonas aeruginosa NOT DETECTED NOT DETECTED Final   Stenotrophomonas maltophilia NOT DETECTED NOT DETECTED Final   Candida albicans NOT DETECTED NOT DETECTED Final   Candida auris NOT DETECTED NOT DETECTED Final   Candida glabrata NOT DETECTED NOT DETECTED Final  Candida krusei NOT DETECTED NOT DETECTED Final   Candida parapsilosis NOT DETECTED NOT DETECTED Final   Candida tropicalis NOT DETECTED NOT DETECTED Final   Cryptococcus neoformans/gattii NOT DETECTED NOT DETECTED Final   Methicillin resistance mecA/C NOT DETECTED NOT DETECTED Final    Comment: Performed at [facility], [place] [address], [place], [place] [address]  MRSA Next Gen by PCR, Nasal     Status: None   Collection Time: 02/14/21 10:34 PM   Specimen: Nasal Mucosa; Nasal Swab  Result Value Ref Range Status   MRSA by PCR Next Gen NOT DETECTED NOT DETECTED Final    Comment: (NOTE) The GeneXpert MRSA Assay (FDA approved for NASAL specimens only), is one component of a comprehensive MRSA colonization surveillance program. It is not intended to diagnose MRSA infection nor to guide or monitor treatment for MRSA infections. Test performance is not FDA approved in patients less than 6 years old. Performed at [facility], [place] [address], [place], [place] [address]   Blood Culture (routine x 2)     Status: None (Preliminary result)   Collection Time: 02/14/21 11:14 PM   Specimen: BLOOD  Result Value Ref Range Status   Specimen Description BLOOD RIGHT ARM  Final   Special Requests   Final    BOTTLES DRAWN AEROBIC AND ANAEROBIC Blood Culture results may not be optimal due to an inadequate volume of blood received in culture bottles   Culture   Final    NO GROWTH 2 DAYS Performed at [facility], [place] [address], [place], [place] [address]     Report Status PENDING  Incomplete     Terri Piedra, NP [facility]  02/17/2021  10:23 AM

## 2021-02-17 NOTE — Progress Notes (Signed)
PROGRESS NOTE    Megan [name]  MVH:[id number] DOB: 27-Feb-1961 DOA: 02/14/2021 PCP: [name], MD    No chief complaint on file.   Brief narrative:    Megan [name] is a 60 y.o. 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  Work-up was significant for fever, SIRS, apparently on meningitis coverage given altered mental status, but her negative for infectious process, MRI with no acute process as well, she is ESRD, seen by renal, hemodialysis 7/5, as well followed by ID.    Subjective: Patient in bed, appears comfortable, denies any headache, no fever, no chest pain or pressure, no shortness of breath , no abdominal pain. Improving diarrhea, No new focal weakness.    Assessment & Plan:    SIRS  -  Due to of recurrent C. Difficile, no other clear source of infection, venous duplex lower extremities negative for DVT, she has been seen by ID.  Currently on fidaxomicin.  1 out of 2 blood cultures growing staph epidermis likely contamination.  Clinically appears nontoxic and will be monitored closely.   Vertigo - Stroke ruled out.  MRI brain with no acute findings   ESRD on HD -Renal consulted, HD per schedule.   HTN - BP low, stop BP Meds, LR bolus x 1, TEDs, monitor.     Breast cancer -Status post bilateral mastectomy and radiation therapy.  Outpatient oncology follow-up.     DVT prophylaxis: Heparin Code Status: Full Family Communication: husband bedside 02/17/21 Disposition:   Status is: Inpatient  Remains inpatient appropriate because:IV treatments appropriate due to intensity of illness or inability to take PO  Dispo: The patient is from: Home              Anticipated d/c is to: Home               Patient currently is not medically stable to d/c.   Difficult to place patient No   Consultants:  Infectious disease, Renal   Objective: Vitals:   02/17/21 0035 02/17/21 0400 02/17/21 0816 02/17/21 1204  BP: 114/73 105/64 (!) 86/53 101/60  Pulse: 78 68 79 73  Resp: 19 20 16 16   Temp: 98.2 F (36.8 C) 97.8 F (36.6 C) (!) 97.5 F (36.4 C) (!) 97.5 F (36.4 C)  TempSrc: Oral Axillary Oral Oral  SpO2: 99% 98%    Weight:      Height:        Intake/Output Summary (Last 24 hours) at 02/17/2021 1208 Last data filed at 02/16/2021 2200 Gross per 24 hour  Intake 120 ml  Output 2500 ml  Net -2380 ml   Filed Weights   02/15/21 1245 02/16/21 0837 02/16/21 1225  Weight: 75 kg 72.4 kg 70 kg    Examination:  Awake Alert, No new F.N deficits, Normal affect [place].AT,PERRAL Supple Neck,No JVD, No cervical lymphadenopathy appriciated.  Symmetrical Chest wall movement, Good air movement bilaterally, CTAB RRR,No Gallops, Rubs or new Murmurs, No Parasternal Heave +ve B.Sounds, Abd Soft, No tenderness, No organomegaly appriciated, No rebound - guarding or rigidity. No Cyanosis, Clubbing or edema, No new Rash or bruise   Data Reviewed: I have personally reviewed following labs and imaging studies  CBC: Recent Labs  Lab 02/14/21 0843 02/14/21 1720 02/15/21 0139 02/16/21 0154  WBC  9.8 10.8* 11.9* 14.8*  NEUTROABS  --  7.3  --   --   HGB 11.7* 11.0* 10.6* 11.3*  HCT 37.5 34.5* 33.6* 35.0*  MCV 106.5* 103.6* 104.3* 100.9*  PLT 238 228 208 086    Basic Metabolic Panel: Recent Labs  Lab 02/14/21 0843 02/14/21 1730 02/15/21 0139 02/16/21 0154  NA 135  --  134* 132*  K 4.7  --  4.8 4.1  CL 99  --  99 95*  CO2 22  --  17* 23  GLUCOSE 109*  --  168* 175*  BUN 80*  --  87* 53*  CREATININE 14.98*  --  16.19* 9.68*  CALCIUM 10.0  --  9.9 9.1  PHOS  --  1.0* 3.1  --     GFR: Estimated Creatinine Clearance: 5.8 mL/min (A) (by C-G formula based on SCr of 9.68 mg/dL  (H)).  Liver Function Tests: No results for input(s): AST, ALT, ALKPHOS, BILITOT, PROT, ALBUMIN in the last 168 hours.  CBG: No results for input(s): GLUCAP in the last 168 hours.   Recent Results (from the past 240 hour(s))  Resp Panel by RT-PCR (Flu A&B, Covid) Nasopharyngeal Swab     Status: None   Collection Time: 02/14/21 11:27 AM   Specimen: Nasopharyngeal Swab; Nasopharyngeal(NP) swabs in vial transport medium  Result Value Ref Range Status   SARS Coronavirus 2 by RT PCR NEGATIVE NEGATIVE Final    Comment: (NOTE) SARS-CoV-2 target nucleic acids are NOT DETECTED.  The SARS-CoV-2 RNA is generally detectable in upper respiratory specimens during the acute phase of infection. The lowest concentration of SARS-CoV-2 viral copies this assay can detect is 138 copies/mL. A negative result does not preclude SARS-Cov-2 infection and should not be used as the sole basis for treatment or other patient management decisions. A negative result may occur with  improper specimen collection/handling, submission of specimen other than nasopharyngeal swab, presence of viral mutation(s) within the areas targeted by this assay, and inadequate number of viral copies(<138 copies/mL). A negative result must be combined with clinical observations, patient history, and epidemiological information. The expected result is Negative.  Fact Sheet for Patients:  EntrepreneurPulse.com.au  Fact Sheet for Healthcare Providers:  IncredibleEmployment.be  This test is no t yet approved or cleared by the [place] FDA and  has been authorized for detection and/or diagnosis of SARS-CoV-2 by FDA under an Emergency Use Authorization (EUA). This EUA will remain  in effect (meaning this test can be used) for the duration of the COVID-19 declaration under Section 564(b)(1) of the Act, 21 U.S.C.section 360bbb-3(b)(1), unless the authorization is terminated  or revoked sooner.        Influenza A by PCR NEGATIVE NEGATIVE Final   Influenza B by PCR NEGATIVE NEGATIVE Final    Comment: (NOTE) The Xpert Xpress SARS-CoV-2/FLU/RSV plus assay is intended as an aid in the diagnosis of influenza from Nasopharyngeal swab specimens and should not be used as a sole basis for treatment. Nasal washings and aspirates are unacceptable for Xpert Xpress SARS-CoV-2/FLU/RSV testing.  Fact Sheet for Patients: EntrepreneurPulse.com.au  Fact Sheet for Healthcare Providers: IncredibleEmployment.be  This test is not yet approved or cleared by the [place] FDA and has been authorized for detection and/or diagnosis of SARS-CoV-2 by FDA under an Emergency Use Authorization (EUA). This EUA will remain in effect (meaning this test can be used) for the duration of the COVID-19 declaration under Section 564(b)(1) of the Act, 21 U.S.C. section 360bbb-3(b)(1), unless the authorization is  terminated or revoked.  Performed at [facility], [address] [place], [place] [address]   CSF culture w Gram Stain     Status: None (Preliminary result)   Collection Time: 02/14/21  5:24 PM   Specimen: CSF; Cerebrospinal Fluid  Result Value Ref Range Status   Specimen Description CSF  Final   Special Requests NONE  Final   Gram Stain   Final    WBC PRESENT, PREDOMINANTLY MONONUCLEAR NO ORGANISMS SEEN CYTOSPIN SMEAR    Culture   Final    NO GROWTH 3 DAYS Performed at [facility], [place] [address], [place], [place] [address]    Report Status PENDING  Incomplete  Blood Culture (routine x 2)     Status: Abnormal (Preliminary result)   Collection Time: 02/14/21  5:30 PM   Specimen: BLOOD  Result Value Ref Range Status   Specimen Description BLOOD RIGHT ANTECUBITAL  Final   Special Requests   Final    BOTTLES DRAWN AEROBIC AND ANAEROBIC Blood Culture adequate volume   Culture  Setup Time   Final    GRAM POSITIVE COCCI IN BOTH AEROBIC AND  ANAEROBIC BOTTLES CRITICAL RESULT CALLED TO, READ BACK BY AND VERIFIED WITH: PHARMD J [place] [id number] AT 2 BY CM    Culture (A)  Final    STAPHYLOCOCCUS HOMINIS THE SIGNIFICANCE OF ISOLATING THIS ORGANISM FROM A SINGLE SET OF BLOOD CULTURES WHEN MULTIPLE SETS ARE DRAWN IS UNCERTAIN. PLEASE NOTIFY THE MICROBIOLOGY DEPARTMENT WITHIN ONE WEEK IF SPECIATION AND SENSITIVITIES ARE REQUIRED. CULTURE REINCUBATED FOR BETTER GROWTH Performed at [facility], [place] [address], [place], [place] [address]    Report Status PENDING  Incomplete  Blood Culture ID Panel (Reflexed)     Status: Abnormal   Collection Time: 02/14/21  5:30 PM  Result Value Ref Range Status   Enterococcus faecalis NOT DETECTED NOT DETECTED Final   Enterococcus Faecium NOT DETECTED NOT DETECTED Final   Listeria monocytogenes NOT DETECTED NOT DETECTED Final   Staphylococcus species DETECTED (A) NOT DETECTED Final    Comment: CRITICAL RESULT CALLED TO, READ BACK BY AND VERIFIED WITH: PHARMD J [place] [id number] AT 1335 BY CM    Staphylococcus aureus (BCID) NOT DETECTED NOT DETECTED Final   Staphylococcus epidermidis DETECTED (A) NOT DETECTED Final    Comment: CRITICAL RESULT CALLED TO, READ BACK BY AND VERIFIED WITH: PHARMD J [place] [id number] AT 1335 BY CM    Staphylococcus lugdunensis NOT DETECTED NOT DETECTED Final   Streptococcus species NOT DETECTED NOT DETECTED Final   Streptococcus agalactiae NOT DETECTED NOT DETECTED Final   Streptococcus pneumoniae NOT DETECTED NOT DETECTED Final   Streptococcus pyogenes NOT DETECTED NOT DETECTED Final   A.calcoaceticus-baumannii NOT DETECTED NOT DETECTED Final   Bacteroides fragilis NOT DETECTED NOT DETECTED Final   Enterobacterales NOT DETECTED NOT DETECTED Final   Enterobacter cloacae complex NOT DETECTED NOT DETECTED Final   Escherichia coli NOT DETECTED NOT DETECTED Final   Klebsiella aerogenes NOT DETECTED NOT DETECTED Final   Klebsiella oxytoca NOT DETECTED NOT DETECTED Final    Klebsiella pneumoniae NOT DETECTED NOT DETECTED Final   Proteus species NOT DETECTED NOT DETECTED Final   Salmonella species NOT DETECTED NOT DETECTED Final   Serratia marcescens NOT DETECTED NOT DETECTED Final   Haemophilus influenzae NOT DETECTED NOT DETECTED Final   Neisseria meningitidis NOT DETECTED NOT DETECTED Final   Pseudomonas aeruginosa NOT DETECTED NOT DETECTED Final   Stenotrophomonas maltophilia NOT DETECTED NOT DETECTED Final   Candida  albicans NOT DETECTED NOT DETECTED Final   Candida auris NOT DETECTED NOT DETECTED Final   Candida glabrata NOT DETECTED NOT DETECTED Final   Candida krusei NOT DETECTED NOT DETECTED Final   Candida parapsilosis NOT DETECTED NOT DETECTED Final   Candida tropicalis NOT DETECTED NOT DETECTED Final   Cryptococcus neoformans/gattii NOT DETECTED NOT DETECTED Final   Methicillin resistance mecA/C NOT DETECTED NOT DETECTED Final    Comment: Performed at [facility], [place] [address] [place], [place] [address]  MRSA Next Gen by PCR, Nasal     Status: None   Collection Time: 02/14/21 10:34 PM   Specimen: Nasal Mucosa; Nasal Swab  Result Value Ref Range Status   MRSA by PCR Next Gen NOT DETECTED NOT DETECTED Final    Comment: (NOTE) The GeneXpert MRSA Assay (FDA approved for NASAL specimens only), is one component of a comprehensive MRSA colonization surveillance program. It is not intended to diagnose MRSA infection nor to guide or monitor treatment for MRSA infections. Test performance is not FDA approved in patients less than 31 years old. Performed at [facility], [place] [address], [place], [place] [address]   Blood Culture (routine x 2)     Status: None (Preliminary result)   Collection Time: 02/14/21 11:14 PM   Specimen: BLOOD  Result Value Ref Range Status   Specimen Description BLOOD RIGHT ARM  Final   Special Requests   Final    BOTTLES DRAWN AEROBIC AND ANAEROBIC Blood Culture results may not be optimal due to an  inadequate volume of blood received in culture bottles   Culture   Final    NO GROWTH 2 DAYS Performed at [facility], [place] [address] [place], [place] [address]    Report Status PENDING  Incomplete         Radiology Studies: VAS [place] LOWER EXTREMITY VENOUS (DVT)  Result Date: 02/15/2021  Lower Venous DVT Study Patient Name:  Megan [name]  Date of Exam:   02/15/2021 Medical Rec #: [id number]            Accession #:    [id number] Date of Birth: 30-Jul-1961            Patient Gender: F Patient Age:   69Y Exam Location:  [facility] Procedure:      VAS [place] LOWER EXTREMITY VENOUS (DVT) Referring Phys: [id number] --------------------------------------------------------------------------------  Indications: Fever of unknown origin.  Comparison Study: No prior study Performing Technologist: Maudry Mayhew MHA, RDMS, RVT, RDCS  Examination Guidelines: A complete evaluation includes B-mode imaging, spectral Doppler, color Doppler, and power Doppler as needed of all accessible portions of each vessel. Bilateral testing is considered an integral part of a complete examination. Limited examinations for reoccurring indications may be performed as noted. The reflux portion of the exam is performed with the patient in reverse Trendelenburg.  +---------+---------------+---------+-----------+----------+--------------+ RIGHT    CompressibilityPhasicitySpontaneityPropertiesThrombus Aging +---------+---------------+---------+-----------+----------+--------------+ CFV      Full           Yes      Yes                                 +---------+---------------+---------+-----------+----------+--------------+ SFJ      Full                                                        +---------+---------------+---------+-----------+----------+--------------+  FV Prox  Full                                                         +---------+---------------+---------+-----------+----------+--------------+ FV Mid   Full                                                        +---------+---------------+---------+-----------+----------+--------------+ FV DistalFull                                                        +---------+---------------+---------+-----------+----------+--------------+ PFV      Full                                                        +---------+---------------+---------+-----------+----------+--------------+ POP      Full           Yes      Yes                                 +---------+---------------+---------+-----------+----------+--------------+ PTV      Full                                                        +---------+---------------+---------+-----------+----------+--------------+ PERO     Full                                                        +---------+---------------+---------+-----------+----------+--------------+   +---------+---------------+---------+-----------+----------+--------------+ LEFT     CompressibilityPhasicitySpontaneityPropertiesThrombus Aging +---------+---------------+---------+-----------+----------+--------------+ CFV      Full           Yes      Yes                                 +---------+---------------+---------+-----------+----------+--------------+ SFJ      Full                                                        +---------+---------------+---------+-----------+----------+--------------+ FV Prox  Full                                                        +---------+---------------+---------+-----------+----------+--------------+  FV Mid   Full                                                        +---------+---------------+---------+-----------+----------+--------------+ FV DistalFull                                                         +---------+---------------+---------+-----------+----------+--------------+ PFV      Full                                                        +---------+---------------+---------+-----------+----------+--------------+ POP      Full           Yes      Yes                                 +---------+---------------+---------+-----------+----------+--------------+ PTV      Full                                                        +---------+---------------+---------+-----------+----------+--------------+ PERO     Full                                                        +---------+---------------+---------+-----------+----------+--------------+     Summary: RIGHT: - There is no evidence of deep vein thrombosis in the lower extremity.  - No cystic structure found in the popliteal fossa.  LEFT: - There is no evidence of deep vein thrombosis in the lower extremity.  - No cystic structure found in the popliteal fossa.  *See table(s) above for measurements and observations. Electronically signed by Ruta Hinds MD on 02/15/2021 at 3:52:13 PM.    Final      Scheduled Meds:  acidophilus  1 capsule Oral TID   Chlorhexidine Gluconate Cloth  6 each Topical Q0600   cinacalcet  30 mg Oral Q supper   DULoxetine  30 mg Oral Daily   fidaxomicin  200 mg Oral BID   [START ON 02/21/2021] fidaxomicin  200 mg Oral Q48H   gabapentin  300 mg Oral QHS   heparin  5,000 Units Subcutaneous Q12H   lidocaine-prilocaine  1 application Topical Q M,W,F-HD   multivitamin  1 tablet Oral Daily   pantoprazole  40 mg Oral Daily   Continuous Infusions:     LOS: 3 days   Signature  Lala Lund M.D on 02/17/2021 at 12:08 PM   -  To page go to [email or web address]

## 2021-02-17 NOTE — Progress Notes (Signed)
Nephrology Follow-Up Consult note   Assessment/Recommendations: Megan [name] is a/an 60 y.o. female with a past medical history significant for HTN, CHF, DM 2, breast cancer, ESRD, admitted for fevers and dizziness.      Outpatient dialysis: MWF   [facility] T 3: 30  EDW 73.5 kg   Optiflux 180 NRe  2K Ca 2.5     Darbepoetin 120 mcg  Weekly  Last dose 6/27 (recently incr from 100) Iron  50 mcg weekly Heparin 2300U qtx Calcitriol 0.25 mcg PO with HD  Dizziness/fevers/C. difficile: Initial concern for meningitis.  Now felt to be recurrence of C. difficile.  On fidaxomicin.  Management per primary and ID  ESRD: MWF.  Tolerated HD yesterday but did have clotting. Will resume her heparin w/ HD.  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  Iron sat 15 but ferritin 1742.  No iron.  No ESA.  Secondary hyperparathyroidism: Holding binders given normal phosphorus.  Continue home calcitriol and Sensipar  Breast cancer: Status post bilateral mastectomy and radiation therapy.  Hypotension: New today likely related to dehydration in the setting of her acute illness.  Agree with 500 cc of IV fluids.  Liberalize fluid restriction to 2 L.  Hold amlodipine and nebivolol  Recommendations conveyed to primary service.    [facility] 02/17/2021 10:05 AM  ___________________________________________________________  CC: Fevers  Interval History/Subjective: Slightly hypotensive and dizzy this morning with ongoing diarrhea.  Being given 500 cc of fluid by primary team.  Patient states otherwise she is feeling relatively well.  Afebrile.  Did have clotting on dialysis yesterday.   Medications:  Current Facility-Administered Medications  Medication Dose Route Frequency Provider Last Rate Last Admin   acetaminophen (TYLENOL) tablet 650 mg  650 mg Oral Q6H PRN Elgergawy, Silver Huguenin, MD   650 mg at 02/15/21 1549   acidophilus  (RISAQUAD) capsule 1 capsule  1 capsule Oral TID Varney Biles, MD   1 capsule at 02/17/21 0903   amLODipine (NORVASC) tablet 5 mg  5 mg Oral BID Wynetta Fines T, MD   5 mg at 02/16/21 1313   Chlorhexidine Gluconate Cloth 2 % PADS 6 each  6 each Topical Q0600 Dwana Melena, MD   6 each at 02/17/21 0540   cinacalcet (SENSIPAR) tablet 30 mg  30 mg Oral Q supper Wynetta Fines T, MD   30 mg at 02/16/21 1640   DULoxetine (CYMBALTA) DR capsule 30 mg  30 mg Oral Daily Wynetta Fines T, MD   30 mg at 02/17/21 0903   fidaxomicin (DIFICID) tablet 200 mg  200 mg Oral BID Thurnell Lose, MD   200 mg at 02/17/21 0903   [START ON 02/21/2021] fidaxomicin (DIFICID) tablet 200 mg  200 mg Oral Q48H Thurnell Lose, MD       gabapentin (NEURONTIN) capsule 300 mg  300 mg Oral QHS Wynetta Fines T, MD   300 mg at 02/16/21 2228   heparin injection 5,000 Units  5,000 Units Subcutaneous Q12H Lequita Halt, MD   5,000 Units at 02/17/21 2979   labetalol (NORMODYNE) injection 10 mg  10 mg Intravenous Q6H PRN Lequita Halt, MD       lanthanum Lucretia Kern) chewable tablet 2,000 mg  2,000 mg Oral PRN Lequita Halt, MD       lidocaine-prilocaine (EMLA) cream 1 application  1 application Topical Q M,W,F-HD Lequita Halt, MD  meclizine (ANTIVERT) tablet 12.5 mg  12.5 mg Oral TID PRN Wynetta Fines T, MD   12.5 mg at 02/17/21 7262   multivitamin (RENA-VIT) tablet 1 tablet  1 tablet Oral Daily Wynetta Fines T, MD   1 tablet at 02/16/21 2228   nebivolol (BYSTOLIC) tablet 10 mg  10 mg Oral Daily Wynetta Fines T, MD   10 mg at 02/16/21 1312   pantoprazole (PROTONIX) EC tablet 40 mg  40 mg Oral Daily Wynetta Fines T, MD   40 mg at 02/17/21 0355      Review of Systems: 10 systems reviewed and negative except per interval history/subjective  Physical Exam: Vitals:   02/17/21 0400 02/17/21 0816  BP: 105/64 (!) 86/53  Pulse: 68 79  Resp: 20 16  Temp: 97.8 F (36.6 C) (!) 97.5 F (36.4 C)  SpO2: 98%    No intake/output data  recorded.  Intake/Output Summary (Last 24 hours) at 02/17/2021 1005 Last data filed at 02/16/2021 2200 Gross per 24 hour  Intake 120 ml  Output 2500 ml  Net -2380 ml   Constitutional: well-appearing, no acute distress ENMT: ears and nose without scars or lesions, MMM CV: normal rate, no edema Respiratory: Bilateral chest rise, normal work of breathing Gastrointestinal: soft, non-tender, no palpable masses or hernias Skin: no visible lesions or rashes Psych: alert, judgement/insight appropriate, appropriate mood and affect   Test Results I personally reviewed new and old clinical labs and radiology tests Lab Results  Component Value Date   NA 132 (L) 02/16/2021   K 4.1 02/16/2021   CL 95 (L) 02/16/2021   CO2 23 02/16/2021   BUN 53 (H) 02/16/2021   CREATININE 9.68 (H) 02/16/2021   CALCIUM 9.1 02/16/2021   ALBUMIN 4.0 01/19/2021   PHOS 3.1 02/15/2021

## 2021-02-18 LAB — RENAL FUNCTION PANEL
Albumin: 3 g/dL — ABNORMAL LOW (ref 3.5–5.0)
Anion gap: 16 — ABNORMAL HIGH (ref 5–15)
BUN: 67 mg/dL — ABNORMAL HIGH (ref 6–20)
CO2: 19 mmol/L — ABNORMAL LOW (ref 22–32)
Calcium: 9 mg/dL (ref 8.9–10.3)
Chloride: 100 mmol/L (ref 98–111)
Creatinine, Ser: 11.23 mg/dL — ABNORMAL HIGH (ref 0.44–1.00)
GFR, Estimated: 4 mL/min — ABNORMAL LOW (ref >60)
Glucose, Bld: 90 mg/dL (ref 70–99)
Phosphorus: 4 mg/dL (ref 2.5–4.6)
Potassium: 4.1 mmol/L (ref 3.5–5.1)
Sodium: 135 mmol/L (ref 135–145)

## 2021-02-18 LAB — CSF CULTURE W GRAM STAIN: Culture: NO GROWTH

## 2021-02-18 MED ORDER — CALCITRIOL 0.25 MCG PO CAPS
0.2500 ug | ORAL_CAPSULE | ORAL | Status: DC
  Administered 2021-02-21: 0.25 ug via ORAL

## 2021-02-18 MED ORDER — MIDODRINE HCL 5 MG PO TABS
10.0000 mg | ORAL_TABLET | ORAL | Status: DC
  Administered 2021-02-18 – 2021-02-21 (×2): 10 mg via ORAL
  Filled 2021-02-18 (×3): qty 2

## 2021-02-18 MED ORDER — CALCITRIOL 0.25 MCG PO CAPS
ORAL_CAPSULE | ORAL | Status: AC
  Administered 2021-02-18: 0.25 ug via ORAL
  Filled 2021-02-18: qty 1

## 2021-02-18 NOTE — Procedures (Signed)
Seen and examined on dialysis.  Procedure supervised.  Blood pressure 115/68 on HD and HR 93.  Tolerating goal.  Left AVF in use.   Claudia Desanctis, MD 02/18/2021  3:04 PM

## 2021-02-18 NOTE — Progress Notes (Signed)
Nephrology Progress Note   Assessment/Recommendations: Megan [name] is a/an 60 y.o. female with a past medical history significant for HTN, CHF, DM 2, breast cancer, ESRD, admitted for fevers and dizziness.      Outpatient dialysis: MWF   [facility] T 3: 30  EDW 73.5 kg   Optiflux 180 NRe  2K Ca 2.5     Darbepoetin 120 mcg  Weekly  Last dose 6/27 (recently incr from 100) Iron  50 mcg weekly Heparin 2300U qtx Calcitriol 0.25 mcg PO with HD  Dizziness/fevers/C. difficile: Initial concern for meningitis.  Now felt to be recurrence of C. difficile.  On fidaxomicin.  Management per primary and ID  ESRD: HD per usual MWF schedule.  Heparin was resumed with HD for this tx as had clotting previously this hospitalization.  Renal panel today - will follow up.  Changed to 2K for dialysate and lowered UF goal to 1 kg as tolerated  Volume/HTN: holding home meds as below given hypotension  Anemia of ESRD: Hemoglobin 11.3 last check.  Iron sat 15 but ferritin 1742.  No iron.  No ESA.  Secondary hyperparathyroidism: Holding binders given normal phosphorus.  resume home calcitriol and continue Sensipar  Breast cancer: Status post bilateral mastectomy and radiation therapy.  Hypotension: New today likely related to dehydration in the setting of her acute illness.  Hold amlodipine and nebivolol (reported home meds)   ___________________________________________________  CC: Fevers  Interval History/Subjective:  Last HD 7/6 with 2.5 kg UF.  Feels better than she has been but not back to baseline. Still diarrhea as below.   Spoke with pt and with husband at bedside.   Review of systems:  Denies shortness of breath; no cp Some dizziness Still with diarrhea; no n/v Did get up and walk a little  Medications:  Current Facility-Administered Medications  Medication Dose Route Frequency Provider Last Rate Last Admin   acetaminophen (TYLENOL) tablet 650 mg  650 mg Oral Q6H PRN Elgergawy, Silver Huguenin, MD    650 mg at 02/15/21 1549   acidophilus (RISAQUAD) capsule 1 capsule  1 capsule Oral TID Varney Biles, MD   1 capsule at 02/17/21 2120   Chlorhexidine Gluconate Cloth 2 % PADS 6 each  6 each Topical Q0600 Dwana Melena, MD   6 each at 02/18/21 0528   cinacalcet (SENSIPAR) tablet 30 mg  30 mg Oral Q supper Wynetta Fines T, MD   30 mg at 02/17/21 1636   DULoxetine (CYMBALTA) DR capsule 30 mg  30 mg Oral Daily Wynetta Fines T, MD   30 mg at 02/17/21 0903   fidaxomicin (DIFICID) tablet 200 mg  200 mg Oral BID Thurnell Lose, MD   200 mg at 02/17/21 2120   [START ON 02/21/2021] fidaxomicin (DIFICID) tablet 200 mg  200 mg Oral Q48H Thurnell Lose, MD       gabapentin (NEURONTIN) capsule 300 mg  300 mg Oral QHS Wynetta Fines T, MD   300 mg at 02/17/21 2120   heparin injection 5,000 Units  5,000 Units Subcutaneous Q12H Wynetta Fines T, MD   5,000 Units at 02/17/21 2120   labetalol (NORMODYNE) injection 10 mg  10 mg Intravenous Q6H PRN Wynetta Fines T, MD       lidocaine-prilocaine (EMLA) cream 1 application  1 application Topical Q M,W,F-HD Wynetta Fines T, MD       meclizine (ANTIVERT) tablet 12.5 mg  12.5 mg Oral TID PRN Lequita Halt, MD   12.5 mg at  02/17/21 1810   midodrine (PROAMATINE) tablet 10 mg  10 mg Oral Q M,W,F Thurnell Lose, MD       multivitamin (RENA-VIT) tablet 1 tablet  1 tablet Oral Daily Wynetta Fines T, MD   1 tablet at 02/17/21 2120   pantoprazole (PROTONIX) EC tablet 40 mg  40 mg Oral Daily Wynetta Fines T, MD   40 mg at 02/17/21 0905    Physical Exam: Vitals:   02/18/21 0400 02/18/21 0744  BP: 103/70 116/70  Pulse:  89  Resp: 16 19  Temp: 98.3 F (36.8 C) 98.2 F (36.8 C)  SpO2: 100%    No intake/output data recorded.  Intake/Output Summary (Last 24 hours) at 02/18/2021 0842 Last data filed at 02/18/2021 0431 Gross per 24 hour  Intake 240 ml  Output --  Net 240 ml   General adult female in bed in no acute distress HEENT normocephalic atraumatic extraocular movements intact  sclera anicteric Neck supple trachea midline Lungs clear to auscultation bilaterally normal work of breathing at rest  Heart S1S2 no rub Abdomen soft nontender nondistended Extremities no edema  Psych normal mood and affect Access; LUE AVF b/t  Test Results I personally reviewed new and old clinical labs and radiology tests Lab Results  Component Value Date   NA 132 (L) 02/16/2021   K 4.1 02/16/2021   CL 95 (L) 02/16/2021   CO2 23 02/16/2021   BUN 53 (H) 02/16/2021   CREATININE 9.68 (H) 02/16/2021   CALCIUM 9.1 02/16/2021   ALBUMIN 4.0 01/19/2021   PHOS 3.1 02/15/2021     Claudia Desanctis, MD 02/18/2021 9:00 AM

## 2021-02-18 NOTE — Progress Notes (Signed)
PROGRESS NOTE    Megan [name]  BOF:[id number] DOB: September 26, 1960 DOA: 02/14/2021 PCP: [name], MD    No chief complaint on file.   Brief narrative:    Megan [name] is a 60 y.o. 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  Work-up was significant for fever, SIRS, apparently on meningitis coverage given altered mental status, but her negative for infectious process, MRI with no acute process as well, she is ESRD, seen by renal, hemodialysis 7/5, as well followed by ID.    Subjective: Patient in bed, appears comfortable, denies any headache, no fever, no chest pain or pressure, no shortness of breath , no abdominal pain. No new focal weakness.  Still having profuse diarrhea about 8-10 loose bowel movements a day.    Assessment & Plan:    SIRS  -  Due to of recurrent C. Difficile, no other clear source of infection, venous duplex lower extremities negative for DVT, she has been seen by ID.  Currently on fidaxomicin.  1 out of 2 blood cultures growing staph epidermis likely contamination.  Clinically appears nontoxic but still has ++ diarrhea, question if we should add Xifaxan or cholestyramine, will defer to ID.    Vertigo - Stroke ruled out.  MRI brain with no acute findings, she is likely orthostatic from profuse diarrhea, have given her some IV fluids, TED stockings and added midodrine on the days of HD.  Symptoms better.   HTN - BP low kindly see above.  ESRD on HD - Renal consulted, HD per schedule.   Breast cancer - Status post bilateral mastectomy and radiation therapy.  Outpatient oncology follow-up.      DVT prophylaxis: Heparin Code Status: Full Family Communication: husband bedside  02/17/21 Disposition:   Status is: Inpatient  Remains inpatient appropriate because:IV treatments appropriate due to intensity of illness or inability to take PO  Dispo: The patient is from: Home              Anticipated d/c is to: Home              Patient currently is not medically stable to d/c.   Difficult to place patient No   Consultants:  Infectious disease, Renal   Objective: Vitals:   02/17/21 2044 02/18/21 0010 02/18/21 0400 02/18/21 0744  BP: 112/72 108/73 103/70 116/70  Pulse:    89  Resp: 17 17 16 19   Temp: 98.6 F (37 C) 98.3 F (36.8 C) 98.3 F (36.8 C) 98.2 F (36.8 C)  TempSrc: Oral Oral Oral Oral  SpO2: 95% 95% 100%   Weight:      Height:        Intake/Output Summary (Last 24 hours) at 02/18/2021 0927 Last data filed at 02/18/2021 0431 Gross per 24 hour  Intake 240 ml  Output --  Net 240 ml   Filed Weights   02/15/21 1245 02/16/21 0837 02/16/21 1225  Weight: 75 kg 72.4 kg 70 kg    Examination:  Awake Alert, No new F.N deficits, Normal affect [place].AT,PERRAL Supple Neck,No JVD, No cervical lymphadenopathy appriciated.  Symmetrical Chest wall movement, Good air movement bilaterally, CTAB RRR,No Gallops, Rubs or new Murmurs, No Parasternal Heave +ve B.Sounds, Abd Soft, No tenderness, No organomegaly appriciated, No rebound - guarding or rigidity.  No Cyanosis, Clubbing or edema, No new Rash or bruise  Data Reviewed: I have personally reviewed following labs and imaging studies  CBC: Recent Labs  Lab 02/14/21 0843 02/14/21 1720 02/15/21 0139 02/16/21 0154  WBC 9.8 10.8* 11.9* 14.8*  NEUTROABS  --  7.3  --   --   HGB 11.7* 11.0* 10.6* 11.3*  HCT 37.5 34.5* 33.6* 35.0*  MCV 106.5* 103.6* 104.3* 100.9*  PLT 238 228 208 093    Basic Metabolic Panel: Recent Labs  Lab 02/14/21 0843 02/14/21 1730 02/15/21 0139 02/16/21 0154  NA 135  --  134* 132*  K 4.7  --  4.8 4.1  CL 99  --  99 95*  CO2 22  --  17* 23  GLUCOSE 109*  --  168* 175*   BUN 80*  --  87* 53*  CREATININE 14.98*  --  16.19* 9.68*  CALCIUM 10.0  --  9.9 9.1  PHOS  --  1.0* 3.1  --     GFR: Estimated Creatinine Clearance: 5.8 mL/min (A) (by C-G formula based on SCr of 9.68 mg/dL (H)).  Liver Function Tests: No results for input(s): AST, ALT, ALKPHOS, BILITOT, PROT, ALBUMIN in the last 168 hours.  CBG: No results for input(s): GLUCAP in the last 168 hours.   Recent Results (from the past 240 hour(s))  Resp Panel by RT-PCR (Flu A&B, Covid) Nasopharyngeal Swab     Status: None   Collection Time: 02/14/21 11:27 AM   Specimen: Nasopharyngeal Swab; Nasopharyngeal(NP) swabs in vial transport medium  Result Value Ref Range Status   SARS Coronavirus 2 by RT PCR NEGATIVE NEGATIVE Final    Comment: (NOTE) SARS-CoV-2 target nucleic acids are NOT DETECTED.  The SARS-CoV-2 RNA is generally detectable in upper respiratory specimens during the acute phase of infection. The lowest concentration of SARS-CoV-2 viral copies this assay can detect is 138 copies/mL. A negative result does not preclude SARS-Cov-2 infection and should not be used as the sole basis for treatment or other patient management decisions. A negative result may occur with  improper specimen collection/handling, submission of specimen other than nasopharyngeal swab, presence of viral mutation(s) within the areas targeted by this assay, and inadequate number of viral copies(<138 copies/mL). A negative result must be combined with clinical observations, patient history, and epidemiological information. The expected result is Negative.  Fact Sheet for Patients:  EntrepreneurPulse.com.au  Fact Sheet for Healthcare Providers:  IncredibleEmployment.be  This test is no t yet approved or cleared by the [place] FDA and  has been authorized for detection and/or diagnosis of SARS-CoV-2 by FDA under an Emergency Use Authorization (EUA). This EUA will remain   in effect (meaning this test can be used) for the duration of the COVID-19 declaration under Section 564(b)(1) of the Act, 21 U.S.C.section 360bbb-3(b)(1), unless the authorization is terminated  or revoked sooner.       Influenza A by PCR NEGATIVE NEGATIVE Final   Influenza B by PCR NEGATIVE NEGATIVE Final    Comment: (NOTE) The Xpert Xpress SARS-CoV-2/FLU/RSV plus assay is intended as an aid in the diagnosis of influenza from Nasopharyngeal swab specimens and should not be used as a sole basis for treatment. Nasal washings and aspirates are unacceptable for Xpert Xpress SARS-CoV-2/FLU/RSV testing.  Fact Sheet for Patients: EntrepreneurPulse.com.au  Fact Sheet for Healthcare Providers: IncredibleEmployment.be  This test is not yet approved or cleared by the [place] FDA and has been authorized for detection and/or diagnosis of SARS-CoV-2 by FDA under  an Emergency Use Authorization (EUA). This EUA will remain in effect (meaning this test can be used) for the duration of the COVID-19 declaration under Section 564(b)(1) of the Act, 21 U.S.C. section 360bbb-3(b)(1), unless the authorization is terminated or revoked.  Performed at [facility], [address] [place], [place] [address]   CSF culture w Gram Stain     Status: None (Preliminary result)   Collection Time: 02/14/21  5:24 PM   Specimen: CSF; Cerebrospinal Fluid  Result Value Ref Range Status   Specimen Description CSF  Final   Special Requests NONE  Final   Gram Stain   Final    WBC PRESENT, PREDOMINANTLY MONONUCLEAR NO ORGANISMS SEEN CYTOSPIN SMEAR    Culture   Final    NO GROWTH 3 DAYS Performed at [facility], [place] [address], [place], [place] [address]    Report Status PENDING  Incomplete  Blood Culture (routine x 2)     Status: Abnormal   Collection Time: 02/14/21  5:30 PM   Specimen: BLOOD  Result Value Ref Range Status   Specimen Description BLOOD RIGHT  ANTECUBITAL  Final   Special Requests   Final    BOTTLES DRAWN AEROBIC AND ANAEROBIC Blood Culture adequate volume   Culture  Setup Time   Final    GRAM POSITIVE COCCI IN BOTH AEROBIC AND ANAEROBIC BOTTLES CRITICAL RESULT CALLED TO, READ BACK BY AND VERIFIED WITH: PHARMD J [place] [id number] AT 98 BY CM    Culture (A)  Final    STAPHYLOCOCCUS HOMINIS STAPHYLOCOCCUS EPIDERMIDIS THE SIGNIFICANCE OF ISOLATING THIS ORGANISM FROM A SINGLE SET OF BLOOD CULTURES WHEN MULTIPLE SETS ARE DRAWN IS UNCERTAIN. PLEASE NOTIFY THE MICROBIOLOGY DEPARTMENT WITHIN ONE WEEK IF SPECIATION AND SENSITIVITIES ARE REQUIRED. Performed at [facility], [place] [address] [place], [place] [address]    Report Status 02/17/2021 FINAL  Final  Blood Culture ID Panel (Reflexed)     Status: Abnormal   Collection Time: 02/14/21  5:30 PM  Result Value Ref Range Status   Enterococcus faecalis NOT DETECTED NOT DETECTED Final   Enterococcus Faecium NOT DETECTED NOT DETECTED Final   Listeria monocytogenes NOT DETECTED NOT DETECTED Final   Staphylococcus species DETECTED (A) NOT DETECTED Final    Comment: CRITICAL RESULT CALLED TO, READ BACK BY AND VERIFIED WITH: PHARMD J [place] [id number] AT 1335 BY CM    Staphylococcus aureus (BCID) NOT DETECTED NOT DETECTED Final   Staphylococcus epidermidis DETECTED (A) NOT DETECTED Final    Comment: CRITICAL RESULT CALLED TO, READ BACK BY AND VERIFIED WITH: PHARMD J [place] [id number] AT 1335 BY CM    Staphylococcus lugdunensis NOT DETECTED NOT DETECTED Final   Streptococcus species NOT DETECTED NOT DETECTED Final   Streptococcus agalactiae NOT DETECTED NOT DETECTED Final   Streptococcus pneumoniae NOT DETECTED NOT DETECTED Final   Streptococcus pyogenes NOT DETECTED NOT DETECTED Final   A.calcoaceticus-baumannii NOT DETECTED NOT DETECTED Final   Bacteroides fragilis NOT DETECTED NOT DETECTED Final   Enterobacterales NOT DETECTED NOT DETECTED Final   Enterobacter cloacae complex NOT  DETECTED NOT DETECTED Final   Escherichia coli NOT DETECTED NOT DETECTED Final   Klebsiella aerogenes NOT DETECTED NOT DETECTED Final   Klebsiella oxytoca NOT DETECTED NOT DETECTED Final   Klebsiella pneumoniae NOT DETECTED NOT DETECTED Final   Proteus species NOT DETECTED NOT DETECTED Final   Salmonella species NOT DETECTED NOT DETECTED Final   Serratia marcescens NOT DETECTED NOT DETECTED Final   Haemophilus influenzae NOT  DETECTED NOT DETECTED Final   Neisseria meningitidis NOT DETECTED NOT DETECTED Final   Pseudomonas aeruginosa NOT DETECTED NOT DETECTED Final   Stenotrophomonas maltophilia NOT DETECTED NOT DETECTED Final   Candida albicans NOT DETECTED NOT DETECTED Final   Candida auris NOT DETECTED NOT DETECTED Final   Candida glabrata NOT DETECTED NOT DETECTED Final   Candida krusei NOT DETECTED NOT DETECTED Final   Candida parapsilosis NOT DETECTED NOT DETECTED Final   Candida tropicalis NOT DETECTED NOT DETECTED Final   Cryptococcus neoformans/gattii NOT DETECTED NOT DETECTED Final   Methicillin resistance mecA/C NOT DETECTED NOT DETECTED Final    Comment: Performed at [facility], [place] [address], [place], [place] [address]  MRSA Next Gen by PCR, Nasal     Status: None   Collection Time: 02/14/21 10:34 PM   Specimen: Nasal Mucosa; Nasal Swab  Result Value Ref Range Status   MRSA by PCR Next Gen NOT DETECTED NOT DETECTED Final    Comment: (NOTE) The GeneXpert MRSA Assay (FDA approved for NASAL specimens only), is one component of a comprehensive MRSA colonization surveillance program. It is not intended to diagnose MRSA infection nor to guide or monitor treatment for MRSA infections. Test performance is not FDA approved in patients less than 54 years old. Performed at [facility], [place] [address], [place], [place] [address]   Blood Culture (routine x 2)     Status: None (Preliminary result)   Collection Time: 02/14/21 11:14 PM   Specimen: BLOOD  Result Value  Ref Range Status   Specimen Description BLOOD RIGHT ARM  Final   Special Requests   Final    BOTTLES DRAWN AEROBIC AND ANAEROBIC Blood Culture results may not be optimal due to an inadequate volume of blood received in culture bottles   Culture   Final    NO GROWTH 3 DAYS Performed at [facility], [place] [address], [place], [place] [address]    Report Status PENDING  Incomplete  Culture, blood (Routine X 2) w Reflex to ID Panel     Status: None (Preliminary result)   Collection Time: 02/17/21  5:04 AM   Specimen: BLOOD  Result Value Ref Range Status   Specimen Description BLOOD THUMB  Final   Special Requests   Final    BOTTLES DRAWN AEROBIC ONLY Blood Culture adequate volume   Culture   Final    NO GROWTH 1 DAY Performed at [facility], [place] [address] [place], [place] [address]    Report Status PENDING  Incomplete  Culture, blood (Routine X 2) w Reflex to ID Panel     Status: None (Preliminary result)   Collection Time: 02/17/21  5:04 AM   Specimen: BLOOD RIGHT HAND  Result Value Ref Range Status   Specimen Description BLOOD RIGHT HAND  Final   Special Requests   Final    BOTTLES DRAWN AEROBIC ONLY Blood Culture adequate volume   Culture   Final    NO GROWTH 1 DAY Performed at [facility], [place] [address], [place], [place] [address]    Report Status PENDING  Incomplete         Radiology Studies: No results found.   Scheduled Meds:  acidophilus  1 capsule Oral TID   calcitRIOL  0.25 mcg Oral Q M,W,F-HD   Chlorhexidine Gluconate Cloth  6 each Topical Q0600   cinacalcet  30 mg Oral Q supper   DULoxetine  30 mg Oral Daily  fidaxomicin  200 mg Oral BID   [START ON 02/21/2021] fidaxomicin  200 mg Oral Q48H   gabapentin  300 mg Oral QHS   heparin  5,000 Units Subcutaneous Q12H   lidocaine-prilocaine  1 application Topical Q M,W,F-HD   midodrine  10 mg Oral Q M,W,F   multivitamin  1 tablet Oral Daily   pantoprazole  40 mg Oral Daily   Continuous  Infusions:     LOS: 4 days   Signature  Lala Lund M.D on 02/18/2021 at 9:27 AM   -  To page go to [email or web address]

## 2021-02-19 LAB — RENAL FUNCTION PANEL
Albumin: 3 g/dL — ABNORMAL LOW (ref 3.5–5.0)
Anion gap: 12 (ref 5–15)
BUN: 43 mg/dL — ABNORMAL HIGH (ref 6–20)
CO2: 25 mmol/L (ref 22–32)
Calcium: 8.8 mg/dL — ABNORMAL LOW (ref 8.9–10.3)
Chloride: 98 mmol/L (ref 98–111)
Creatinine, Ser: 8.26 mg/dL — ABNORMAL HIGH (ref 0.44–1.00)
GFR, Estimated: 5 mL/min — ABNORMAL LOW (ref >60)
Glucose, Bld: 162 mg/dL — ABNORMAL HIGH (ref 70–99)
Phosphorus: 4.7 mg/dL — ABNORMAL HIGH (ref 2.5–4.6)
Potassium: 2.7 mmol/L — CL (ref 3.5–5.1)
Sodium: 135 mmol/L (ref 135–145)

## 2021-02-19 LAB — POTASSIUM: Potassium: 3.5 mmol/L (ref 3.5–5.1)

## 2021-02-19 MED ORDER — POTASSIUM CHLORIDE CRYS ER 20 MEQ PO TBCR
40.0000 meq | EXTENDED_RELEASE_TABLET | Freq: Once | ORAL | Status: AC
  Administered 2021-02-19: 40 meq via ORAL
  Filled 2021-02-19: qty 2

## 2021-02-19 MED ORDER — LACTATED RINGERS IV SOLN: INTRAVENOUS | Status: AC

## 2021-02-19 NOTE — Plan of Care (Signed)
Patient receiving treatment for C. Diff. Colitis. Orthostatic hypotension again confirmed by this RN this AM. MIVFs ordered by MD. No acute distress.   Problem: Fluid Volume: Goal: Hemodynamic stability will improve Outcome: Progressing   Problem: Clinical Measurements: Goal: Diagnostic test results will improve Outcome: Progressing Goal: Signs and symptoms of infection will decrease Outcome: Progressing   Problem: Education: Goal: Knowledge of General Education information will improve Description: Including pain rating scale, medication(s)/side effects and non-pharmacologic comfort measures Outcome: Progressing   Problem: Respiratory: Goal: Ability to maintain adequate ventilation will improve Outcome: Progressing   Problem: Health Behavior/Discharge Planning: Goal: Ability to manage health-related needs will improve Outcome: Progressing   Problem: Clinical Measurements: Goal: Ability to maintain clinical measurements within normal limits will improve Outcome: Progressing Goal: Will remain free from infection Outcome: Progressing Goal: Diagnostic test results will improve Outcome: Progressing Goal: Respiratory complications will improve Outcome: Progressing Goal: Cardiovascular complication will be avoided Outcome: Progressing   Problem: Nutrition: Goal: Adequate nutrition will be maintained Outcome: Progressing   Problem: Coping: Goal: Level of anxiety will decrease Outcome: Progressing   Problem: Elimination: Goal: Will not experience complications related to bowel motility Outcome: Progressing Goal: Will not experience complications related to urinary retention Outcome: Progressing   Problem: Pain Managment: Goal: General experience of comfort will improve Outcome: Progressing   Problem: Safety: Goal: Ability to remain free from injury will improve Outcome: Progressing   Problem: Skin Integrity: Goal: Risk for impaired skin integrity will  decrease Outcome: Progressing

## 2021-02-19 NOTE — Progress Notes (Signed)
PROGRESS NOTE    Megan [name]  URK:[id number] DOB: 1961-07-31 DOA: 02/14/2021 PCP: [name], MD    No chief complaint on file.   Brief narrative:    Megan [name] is a 60 y.o. 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  Work-up was significant for fever, SIRS, apparently on meningitis coverage given altered mental status, but her negative for infectious process, MRI with no acute process as well, she is ESRD, seen by renal, hemodialysis 7/5, as well followed by ID.    Subjective: Patient in bed, appears comfortable, denies any headache, no fever, no chest pain or pressure, no shortness of breath , no abdominal pain. No new focal weakness.    Assessment & Plan:    SIRS  -  Due to of recurrent C. Difficile, no other clear source of infection, venous duplex lower extremities negative for DVT, she has been seen by ID.  Currently on fidaxomicin.  1 out of 2 blood cultures growing staph epidermis likely contamination.  Clinically appears nontoxic but still has ++ diarrhea, question if we should add Xifaxan or cholestyramine, will defer to ID.   Vertigo - Stroke ruled out.  MRI brain with no acute findings, she remains hypotensive and orthostatic, repeat gentle IV fluids on 02/19/2021, continue TED stockings and monitor.   HTN - BP low kindly see above.  ESRD on HD - Renal consulted, HD per schedule.   Breast cancer - Status post bilateral mastectomy and radiation therapy.  Outpatient oncology follow-up.      DVT prophylaxis: Heparin Code Status: Full Family Communication: husband bedside 02/17/21 Disposition:   Status is: Inpatient  Remains inpatient appropriate because:IV treatments  appropriate due to intensity of illness or inability to take PO  Dispo: The patient is from: Home              Anticipated d/c is to: Home              Patient currently is not medically stable to d/c.   Difficult to place patient No   Consultants:  Infectious disease, Renal   Objective: Vitals:   02/19/21 0000 02/19/21 0410 02/19/21 0752 02/19/21 0754  BP: 98/70 112/73 (S) 112/68 (!) 81/64  Pulse:      Resp: 15 16 18 14   Temp: 98.3 F (36.8 C) 98 F (36.7 C)  97.8 F (36.6 C)  TempSrc: Oral Oral  Oral  SpO2: 95% 95%  97%  Weight:      Height:        Intake/Output Summary (Last 24 hours) at 02/19/2021 1004 Last data filed at 02/18/2021 2200 Gross per 24 hour  Intake 120 ml  Output 1000 ml  Net -880 ml   Filed Weights   02/16/21 1225 02/18/21 1354 02/18/21 1745  Weight: 70 kg 72.3 kg 71.3 kg    Examination:  Awake Alert, No new F.N deficits, Normal affect [place].AT,PERRAL Supple Neck,No JVD, No cervical lymphadenopathy appriciated.  Symmetrical Chest wall movement, Good air movement bilaterally, CTAB RRR,No Gallops, Rubs or new Murmurs, No Parasternal Heave +ve B.Sounds, Abd Soft, No tenderness, No organomegaly appriciated, No rebound - guarding or rigidity. No Cyanosis, Clubbing or edema, No new Rash or bruise   Data Reviewed: I have personally reviewed following labs and imaging studies  CBC: Recent Labs  Lab 02/14/21 0843 02/14/21 1720 02/15/21 0139 02/16/21 0154  WBC 9.8 10.8* 11.9* 14.8*  NEUTROABS  --  7.3  --   --   HGB 11.7* 11.0* 10.6* 11.3*  HCT 37.5 34.5* 33.6* 35.0*  MCV 106.5* 103.6* 104.3* 100.9*  PLT 238 228 208 253    Basic Metabolic Panel: Recent Labs  Lab 02/14/21 0843 02/14/21 1730 02/15/21 0139 02/16/21 0154 02/18/21 1006  NA 135  --  134* 132* 135  K 4.7  --  4.8 4.1 4.1  CL 99  --  99 95* 100  CO2 22  --  17* 23 19*  GLUCOSE 109*  --  168* 175* 90  BUN 80*  --  87* 53* 67*  CREATININE 14.98*  --  16.19* 9.68* 11.23*   CALCIUM 10.0  --  9.9 9.1 9.0  PHOS  --  1.0* 3.1  --  4.0    GFR: Estimated Creatinine Clearance: 5.1 mL/min (A) (by C-G formula based on SCr of 11.23 mg/dL (H)).  Liver Function Tests: Recent Labs  Lab 02/18/21 1006  ALBUMIN 3.0*    CBG: No results for input(s): GLUCAP in the last 168 hours.   Recent Results (from the past 240 hour(s))  Resp Panel by RT-PCR (Flu A&B, Covid) Nasopharyngeal Swab     Status: None   Collection Time: 02/14/21 11:27 AM   Specimen: Nasopharyngeal Swab; Nasopharyngeal(NP) swabs in vial transport medium  Result Value Ref Range Status   SARS Coronavirus 2 by RT PCR NEGATIVE NEGATIVE Final    Comment: (NOTE) SARS-CoV-2 target nucleic acids are NOT DETECTED.  The SARS-CoV-2 RNA is generally detectable in upper respiratory specimens during the acute phase of infection. The lowest concentration of SARS-CoV-2 viral copies this assay can detect is 138 copies/mL. A negative result does not preclude SARS-Cov-2 infection and should not be used as the sole basis for treatment or other patient management decisions. A negative result may occur with  improper specimen collection/handling, submission of specimen other than nasopharyngeal swab, presence of viral mutation(s) within the areas targeted by this assay, and inadequate number of viral copies(<138 copies/mL). A negative result must be combined with clinical observations, patient history, and epidemiological information. The expected result is Negative.  Fact Sheet for Patients:  EntrepreneurPulse.com.au  Fact Sheet for Healthcare Providers:  IncredibleEmployment.be  This test is no t yet approved or cleared by the [place] FDA and  has been authorized for detection and/or diagnosis of SARS-CoV-2 by FDA under an Emergency Use Authorization (EUA). This EUA will remain  in effect (meaning this test can be used) for the duration of the COVID-19 declaration  under Section 564(b)(1) of the Act, 21 U.S.C.section 360bbb-3(b)(1), unless the authorization is terminated  or revoked sooner.       Influenza A by PCR NEGATIVE NEGATIVE Final   Influenza B by PCR NEGATIVE NEGATIVE Final    Comment: (NOTE) The Xpert Xpress SARS-CoV-2/FLU/RSV plus assay is intended as an aid in the diagnosis of influenza from Nasopharyngeal swab specimens and should not be used as a sole basis for treatment. Nasal washings and aspirates are unacceptable for Xpert Xpress SARS-CoV-2/FLU/RSV testing.  Fact Sheet for Patients: EntrepreneurPulse.com.au  Fact Sheet for Healthcare Providers: IncredibleEmployment.be  This test is not yet approved or cleared by the [place] FDA and has been authorized for detection and/or diagnosis of SARS-CoV-2 by FDA under an Emergency Use Authorization (EUA). This EUA will remain in effect (meaning this test can be  used) for the duration of the COVID-19 declaration under Section 564(b)(1) of the Act, 21 U.S.C. section 360bbb-3(b)(1), unless the authorization is terminated or revoked.  Performed at [facility], [address] [place], [place] [address]   CSF culture w Gram Stain     Status: None   Collection Time: 02/14/21  5:24 PM   Specimen: CSF; Cerebrospinal Fluid  Result Value Ref Range Status   Specimen Description CSF  Final   Special Requests NONE  Final   Gram Stain   Final    WBC PRESENT, PREDOMINANTLY MONONUCLEAR NO ORGANISMS SEEN CYTOSPIN SMEAR    Culture   Final    NO GROWTH 3 DAYS Performed at [facility], [place] [address] [place], [place] [address]    Report Status 02/18/2021 FINAL  Final  Blood Culture (routine x 2)     Status: Abnormal   Collection Time: 02/14/21  5:30 PM   Specimen: BLOOD  Result Value Ref Range Status   Specimen Description BLOOD RIGHT ANTECUBITAL  Final   Special Requests   Final    BOTTLES DRAWN AEROBIC AND ANAEROBIC Blood Culture  adequate volume   Culture  Setup Time   Final    GRAM POSITIVE COCCI IN BOTH AEROBIC AND ANAEROBIC BOTTLES CRITICAL RESULT CALLED TO, READ BACK BY AND VERIFIED WITH: PHARMD J [place] [id number] AT 33 BY CM    Culture (A)  Final    STAPHYLOCOCCUS HOMINIS STAPHYLOCOCCUS EPIDERMIDIS THE SIGNIFICANCE OF ISOLATING THIS ORGANISM FROM A SINGLE SET OF BLOOD CULTURES WHEN MULTIPLE SETS ARE DRAWN IS UNCERTAIN. PLEASE NOTIFY THE MICROBIOLOGY DEPARTMENT WITHIN ONE WEEK IF SPECIATION AND SENSITIVITIES ARE REQUIRED. Performed at [facility], [place] [address], [place],  [address]    Report Status 02/17/2021 FINAL  Final  Blood Culture ID Panel (Reflexed)     Status: Abnormal   Collection Time: 02/14/21  5:30 PM  Result Value Ref Range Status   Enterococcus faecalis NOT DETECTED NOT DETECTED Final   Enterococcus Faecium NOT DETECTED NOT DETECTED Final   Listeria monocytogenes NOT DETECTED NOT DETECTED Final   Staphylococcus species DETECTED (A) NOT DETECTED Final    Comment: CRITICAL RESULT CALLED TO, READ BACK BY AND VERIFIED WITH: PHARMD J [place] [id number] AT 1335 BY CM    Staphylococcus aureus (BCID) NOT DETECTED NOT DETECTED Final   Staphylococcus epidermidis DETECTED (A) NOT DETECTED Final    Comment: CRITICAL RESULT CALLED TO, READ BACK BY AND VERIFIED WITH: PHARMD J [place] [id number] AT 1335 BY CM    Staphylococcus lugdunensis NOT DETECTED NOT DETECTED Final   Streptococcus species NOT DETECTED NOT DETECTED Final   Streptococcus agalactiae NOT DETECTED NOT DETECTED Final   Streptococcus pneumoniae NOT DETECTED NOT DETECTED Final   Streptococcus pyogenes NOT DETECTED NOT DETECTED Final   A.calcoaceticus-baumannii NOT DETECTED NOT DETECTED Final   Bacteroides fragilis NOT DETECTED NOT DETECTED Final   Enterobacterales NOT DETECTED NOT DETECTED Final   Enterobacter cloacae complex NOT DETECTED NOT DETECTED Final   Escherichia coli NOT DETECTED NOT DETECTED Final   Klebsiella aerogenes NOT  DETECTED NOT DETECTED Final   Klebsiella oxytoca NOT DETECTED NOT DETECTED Final   Klebsiella pneumoniae NOT DETECTED NOT DETECTED Final   Proteus species NOT DETECTED NOT DETECTED Final   Salmonella species NOT DETECTED NOT DETECTED Final   Serratia marcescens NOT DETECTED NOT DETECTED Final   Haemophilus influenzae NOT DETECTED NOT DETECTED Final   Neisseria meningitidis NOT DETECTED NOT DETECTED Final   Pseudomonas aeruginosa  NOT DETECTED NOT DETECTED Final   Stenotrophomonas maltophilia NOT DETECTED NOT DETECTED Final   Candida albicans NOT DETECTED NOT DETECTED Final   Candida auris NOT DETECTED NOT DETECTED Final   Candida glabrata NOT DETECTED NOT DETECTED Final   Candida krusei NOT DETECTED NOT DETECTED Final   Candida parapsilosis NOT DETECTED NOT DETECTED Final   Candida tropicalis NOT DETECTED NOT DETECTED Final   Cryptococcus neoformans/gattii NOT DETECTED NOT DETECTED Final   Methicillin resistance mecA/C NOT DETECTED NOT DETECTED Final    Comment: Performed at [facility], [place] [address] [place], [place] [address]  MRSA Next Gen by PCR, Nasal     Status: None   Collection Time: 02/14/21 10:34 PM   Specimen: Nasal Mucosa; Nasal Swab  Result Value Ref Range Status   MRSA by PCR Next Gen NOT DETECTED NOT DETECTED Final    Comment: (NOTE) The GeneXpert MRSA Assay (FDA approved for NASAL specimens only), is one component of a comprehensive MRSA colonization surveillance program. It is not intended to diagnose MRSA infection nor to guide or monitor treatment for MRSA infections. Test performance is not FDA approved in patients less than 55 years old. Performed at [facility], [place] [address] [place], [place] [address]   Blood Culture (routine x 2)     Status: None (Preliminary result)   Collection Time: 02/14/21 11:14 PM   Specimen: BLOOD  Result Value Ref Range Status   Specimen Description BLOOD RIGHT ARM  Final   Special Requests   Final    BOTTLES  DRAWN AEROBIC AND ANAEROBIC Blood Culture results may not be optimal due to an inadequate volume of blood received in culture bottles   Culture   Final    NO GROWTH 4 DAYS Performed at [facility], [place] [address] [place], [place] [address]    Report Status PENDING  Incomplete  Culture, blood (Routine X 2) w Reflex to ID Panel     Status: None (Preliminary result)   Collection Time: 02/17/21  5:04 AM   Specimen: BLOOD  Result Value Ref Range Status   Specimen Description BLOOD THUMB  Final   Special Requests   Final    BOTTLES DRAWN AEROBIC ONLY Blood Culture adequate volume   Culture   Final    NO GROWTH 2 DAYS Performed at [facility], [place] [address], [place], [place] [address]    Report Status PENDING  Incomplete  Culture, blood (Routine X 2) w Reflex to ID Panel     Status: None (Preliminary result)   Collection Time: 02/17/21  5:04 AM   Specimen: BLOOD RIGHT HAND  Result Value Ref Range Status   Specimen Description BLOOD RIGHT HAND  Final   Special Requests   Final    BOTTLES DRAWN AEROBIC ONLY Blood Culture adequate volume   Culture   Final    NO GROWTH 2 DAYS Performed at [facility], [place] [address] [place], [place] [address]    Report Status PENDING  Incomplete     Radiology Studies: No results found.   Scheduled Meds:  acidophilus  1 capsule Oral TID   calcitRIOL  0.25 mcg Oral Q M,W,F-HD   Chlorhexidine Gluconate Cloth  6 each Topical Q0600   cinacalcet  30 mg Oral Q supper   DULoxetine  30 mg Oral Daily   fidaxomicin  200 mg Oral BID   [START ON 02/21/2021] fidaxomicin  200 mg Oral Q48H  gabapentin  300 mg Oral QHS   heparin  5,000 Units Subcutaneous Q12H   lidocaine-prilocaine  1 application Topical Q M,W,F-HD   midodrine  10 mg Oral Q M,W,F   multivitamin  1 tablet Oral Daily   pantoprazole  40 mg Oral Daily   Continuous Infusions:  lactated ringers 100 mL/hr at 02/19/21 0849      LOS: 5 days   Signature  Lala Lund  M.D on 02/19/2021 at 10:04 AM   -  To page go to [email or web address]

## 2021-02-19 NOTE — Progress Notes (Signed)
Nephrology Progress Note   Assessment/Recommendations: Megan [name] is a/an 60 y.o. female with a past medical history significant for HTN, CHF, DM 2, breast cancer, ESRD, admitted for fevers and dizziness.     Outpatient dialysis: MWF   [facility] T 3: 30  EDW 73.5 kg   Optiflux 180 NRe  2K Ca 2.5     Darbepoetin 120 mcg  Weekly  Last dose 6/27 (recently incr from 100) Iron  50 mcg weekly Heparin 2300U qtx Calcitriol 0.25 mcg PO with HD  Dizziness/fevers/C. difficile: Initial concern for meningitis.  Now felt to be recurrence of C. difficile.  On fidaxomicin.  Management per primary and ID  ESRD: HD per usual MWF schedule.  Heparin was resumed with HD for this tx as had clotting previously this hospitalization.  She is on limited duration fluids (10 hours)  Volume/HTN: holding home meds as below given hypotension  Anemia of ESRD: Hemoglobin 11.3 last check.  Iron sat 15 but ferritin 1742.  No iron.  No ESA.  Secondary hyperparathyroidism: had been holding binders given normal phosphorus and GI issues above - watch for need to restart.  resumed home calcitriol and continue Sensipar  Breast cancer: Status post bilateral mastectomy and radiation therapy.  Hypotension: setting of fluid losses with acute illness.  Hold amlodipine and nebivolol (reported home meds).  She is for LR today limited duration   ___________________________________________________  CC: Fevers  Interval History/Subjective:  Last HD 7/8 with 1 kg UF.  She had orthostatics this AM and 112/68 while in bed and 81/64 with standing.  She was 117/77 for me lying down.  HD went ok yesterday.   Review of systems:  Denies shortness of breath; no cp Some dizziness with standing  Hx diarrhea; no n/v  Medications:  Current Facility-Administered Medications  Medication Dose Route Frequency Provider Last Rate Last Admin   acetaminophen (TYLENOL) tablet 650 mg  650 mg Oral Q6H PRN Elgergawy, Silver Huguenin, MD   650 mg at  02/19/21 0638   acidophilus (RISAQUAD) capsule 1 capsule  1 capsule Oral TID Varney Biles, MD   1 capsule at 02/19/21 1008   calcitRIOL (ROCALTROL) capsule 0.25 mcg  0.25 mcg Oral Q M,W,F-HD Claudia Desanctis, MD   0.25 mcg at 02/18/21 1653   Chlorhexidine Gluconate Cloth 2 % PADS 6 each  6 each Topical Q0600 Dwana Melena, MD   6 each at 02/18/21 0528   cinacalcet (SENSIPAR) tablet 30 mg  30 mg Oral Q supper Wynetta Fines T, MD   30 mg at 02/18/21 1806   DULoxetine (CYMBALTA) DR capsule 30 mg  30 mg Oral Daily Wynetta Fines T, MD   30 mg at 02/19/21 1008   fidaxomicin (DIFICID) tablet 200 mg  200 mg Oral BID Thurnell Lose, MD   200 mg at 02/19/21 1008   [START ON 02/21/2021] fidaxomicin (DIFICID) tablet 200 mg  200 mg Oral Q48H Thurnell Lose, MD       gabapentin (NEURONTIN) capsule 300 mg  300 mg Oral QHS Wynetta Fines T, MD   300 mg at 02/18/21 2151   heparin injection 5,000 Units  5,000 Units Subcutaneous Q12H Wynetta Fines T, MD   5,000 Units at 02/19/21 1009   labetalol (NORMODYNE) injection 10 mg  10 mg Intravenous Q6H PRN Lequita Halt, MD       lactated ringers infusion   Intravenous Continuous Thurnell Lose, MD 100 mL/hr at 02/19/21 0849 New Bag at 02/19/21 [phone]  lidocaine-prilocaine (EMLA) cream 1 application  1 application Topical Q M,W,F-HD Wynetta Fines T, MD       meclizine (ANTIVERT) tablet 12.5 mg  12.5 mg Oral TID PRN Wynetta Fines T, MD   12.5 mg at 02/19/21 0638   midodrine (PROAMATINE) tablet 10 mg  10 mg Oral Q M,W,F Thurnell Lose, MD   10 mg at 02/18/21 0955   multivitamin (RENA-VIT) tablet 1 tablet  1 tablet Oral Daily Wynetta Fines T, MD   1 tablet at 02/18/21 2151   pantoprazole (PROTONIX) EC tablet 40 mg  40 mg Oral Daily Wynetta Fines T, MD   40 mg at 02/19/21 1008    Physical Exam: Vitals:   02/19/21 0752 02/19/21 0754  BP: (S) 112/68 (!) 81/64  Pulse:    Resp: 18 14  Temp:  97.8 F (36.6 C)  SpO2:  97%   No intake/output data recorded.  Intake/Output Summary  (Last 24 hours) at 02/19/2021 1012 Last data filed at 02/18/2021 2200 Gross per 24 hour  Intake 120 ml  Output 1000 ml  Net -880 ml   General adult female in bed in no acute distress  HEENT normocephalic atraumatic extraocular movements intact sclera anicteric Neck supple trachea midline Lungs clear to auscultation bilaterally normal work of breathing at rest  Heart S1S2 no rub Abdomen soft nontender nondistended Extremities no edema  Psych normal mood and affect Access; LUE AVF b/t  Test Results I personally reviewed new and old clinical labs and radiology tests Lab Results  Component Value Date   NA 135 02/18/2021   K 4.1 02/18/2021   CL 100 02/18/2021   CO2 19 (L) 02/18/2021   BUN 67 (H) 02/18/2021   CREATININE 11.23 (H) 02/18/2021   CALCIUM 9.0 02/18/2021   ALBUMIN 3.0 (L) 02/18/2021   PHOS 4.0 02/18/2021     Claudia Desanctis, MD 02/19/2021 10:12 AM

## 2021-02-20 LAB — CBC
HCT: 34.4 % — ABNORMAL LOW (ref 36.0–46.0)
Hemoglobin: 10.9 g/dL — ABNORMAL LOW (ref 12.0–15.0)
MCH: 32.3 pg (ref 26.0–34.0)
MCHC: 31.7 g/dL (ref 30.0–36.0)
MCV: 102.1 fL — ABNORMAL HIGH (ref 80.0–100.0)
Platelets: 267 10*3/uL (ref 150–400)
RBC: 3.37 MIL/uL — ABNORMAL LOW (ref 3.87–5.11)
RDW: 15 % (ref 11.5–15.5)
WBC: 11.1 10*3/uL — ABNORMAL HIGH (ref 4.0–10.5)
nRBC: 0 % (ref 0.0–0.2)

## 2021-02-20 LAB — RENAL FUNCTION PANEL
Albumin: 2.9 g/dL — ABNORMAL LOW (ref 3.5–5.0)
Anion gap: 13 (ref 5–15)
BUN: 55 mg/dL — ABNORMAL HIGH (ref 6–20)
CO2: 25 mmol/L (ref 22–32)
Calcium: 8.8 mg/dL — ABNORMAL LOW (ref 8.9–10.3)
Chloride: 100 mmol/L (ref 98–111)
Creatinine, Ser: 9.4 mg/dL — ABNORMAL HIGH (ref 0.44–1.00)
GFR, Estimated: 4 mL/min — ABNORMAL LOW (ref >60)
Glucose, Bld: 107 mg/dL — ABNORMAL HIGH (ref 70–99)
Phosphorus: 4.2 mg/dL (ref 2.5–4.6)
Potassium: 3.7 mmol/L (ref 3.5–5.1)
Sodium: 138 mmol/L (ref 135–145)

## 2021-02-20 LAB — CULTURE, BLOOD (ROUTINE X 2): Culture: NO GROWTH

## 2021-02-20 MED ORDER — CHLORHEXIDINE GLUCONATE CLOTH 2 % EX PADS: 6.0000 | MEDICATED_PAD | Freq: Every day | CUTANEOUS | Status: DC

## 2021-02-20 MED ORDER — LANTHANUM CARBONATE 500 MG PO CHEW
1000.0000 mg | CHEWABLE_TABLET | Freq: Three times a day (TID) | ORAL | Status: DC
  Administered 2021-02-20 (×2): 1000 mg via ORAL
  Filled 2021-02-20 (×3): qty 2

## 2021-02-20 NOTE — Plan of Care (Signed)
  Problem: Fluid Volume: Goal: Hemodynamic stability will improve Outcome: Progressing   Problem: Clinical Measurements: Goal: Diagnostic test results will improve Outcome: Progressing Goal: Signs and symptoms of infection will decrease Outcome: Progressing   Problem: Respiratory: Goal: Ability to maintain adequate ventilation will improve Outcome: Progressing   Problem: Education: Goal: Knowledge of General Education information will improve Description: Including pain rating scale, medication(s)/side effects and non-pharmacologic comfort measures Outcome: Progressing   Problem: Health Behavior/Discharge Planning: Goal: Ability to manage health-related needs will improve Outcome: Progressing   Problem: Clinical Measurements: Goal: Ability to maintain clinical measurements within normal limits will improve Outcome: Progressing Goal: Will remain free from infection Outcome: Progressing Goal: Diagnostic test results will improve Outcome: Progressing Goal: Respiratory complications will improve Outcome: Progressing Goal: Cardiovascular complication will be avoided Outcome: Progressing   Problem: Activity: Goal: Risk for activity intolerance will decrease Outcome: Progressing   Problem: Nutrition: Goal: Adequate nutrition will be maintained Outcome: Progressing   Problem: Coping: Goal: Level of anxiety will decrease Outcome: Progressing   Problem: Pain Managment: Goal: General experience of comfort will improve Outcome: Progressing   Problem: Elimination: Goal: Will not experience complications related to bowel motility Outcome: Progressing Goal: Will not experience complications related to urinary retention Outcome: Progressing   Problem: Safety: Goal: Ability to remain free from injury will improve Outcome: Progressing   Problem: Skin Integrity: Goal: Risk for impaired skin integrity will decrease Outcome: Progressing

## 2021-02-20 NOTE — Progress Notes (Signed)
PROGRESS NOTE    Megan [name]  ZOX:[id number] DOB: [birth year] DOA: 02/14/2021 PCP: [name], MD    No chief complaint on file.   Brief narrative:    Megan [name] is a 60 y.o. 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  Work-up was significant for fever, SIRS, apparently on meningitis coverage given altered mental status, but her negative for infectious process, MRI with no acute process as well, she is ESRD, seen by renal, hemodialysis 7/5, as well followed by ID.    Subjective: Patient in chair, appears comfortable, denies any headache, no fever, no chest pain or pressure, no shortness of breath , no abdominal pain. No new focal weakness. Improved Diarrhea.    Assessment & Plan:    SIRS  -  Due to of recurrent C. Difficile colitis, she has been seen by ID.  Currently on fidaxomicin.  1 out of 2 blood cultures growing staph epidermis likely contamination.  Clinically appears nontoxic but still & diarrhea finally seems to be better.   Vertigo - Stroke ruled out.  MRI brain with no acute findings, she remains hypotensive and orthostatic, BP better after IVF & TEDs.   HTN - BP low kindly see above.  ESRD on HD - Renal consulted, HD per schedule.   Breast cancer - Status post bilateral mastectomy and radiation therapy.  Outpatient oncology follow-up.      DVT prophylaxis: Heparin Code Status: Full Family Communication: husband bedside 02/17/21, 02/19/21, 02/20/21 Disposition:   Status is: Inpatient  Remains inpatient appropriate because:IV treatments appropriate due to intensity of illness or inability to take PO  Dispo: The patient is from: Home              Anticipated d/c is to: Home               Patient currently is not medically stable to d/c.   Difficult to place patient No   Consultants:  Infectious disease, Renal   Objective: Vitals:   02/19/21 2025 02/19/21 2347 02/20/21 0329 02/20/21 0759  BP: 111/62 126/71 128/79 (!) 146/86  Pulse: 85 95 83 79  Resp: 17 17 17 13   Temp: 98.2 F (36.8 C) 98.7 F (37.1 C) 98.5 F (36.9 C) (!) 97.5 F (36.4 C)  TempSrc: Axillary Oral Oral Oral  SpO2: 98% 98% 97% 98%  Weight:      Height:        Intake/Output Summary (Last 24 hours) at 02/20/2021 0831 Last data filed at 02/19/2021 1700 Gross per 24 hour  Intake 761.17 ml  Output --  Net 761.17 ml   Filed Weights   02/16/21 1225 02/18/21 1354 02/18/21 1745  Weight: 70 kg 72.3 kg 71.3 kg    Examination:  Awake Alert, No new F.N deficits, Normal affect [place].AT,PERRAL Supple Neck,No JVD, No cervical lymphadenopathy appriciated.  Symmetrical Chest wall movement, Good air movement bilaterally, CTAB RRR,No Gallops, Rubs or new Murmurs, No Parasternal Heave +ve B.Sounds, Abd Soft, No tenderness, No organomegaly appriciated, No rebound - guarding or rigidity. No Cyanosis, Clubbing or edema, No new Rash or bruise   Data Reviewed: I have personally reviewed following labs and imaging studies  CBC: Recent Labs  Lab 02/14/21 0843 02/14/21 1720 02/15/21 0139 02/16/21 0154 02/20/21 0018  WBC 9.8  10.8* 11.9* 14.8* 11.1*  NEUTROABS  --  7.3  --   --   --   HGB 11.7* 11.0* 10.6* 11.3* 10.9*  HCT 37.5 34.5* 33.6* 35.0* 34.4*  MCV 106.5* 103.6* 104.3* 100.9* 102.1*  PLT 238 228 208 232 696    Basic Metabolic Panel: Recent Labs  Lab 02/14/21 1730 02/15/21 0139 02/16/21 0154 02/18/21 1006 02/19/21 1209 02/19/21 1657 02/20/21 0018  NA  --  134* 132* 135 135  --  138  K  --  4.8 4.1 4.1 2.7* 3.5 3.7  CL  --  99 95* 100 98  --  100  CO2  --  17* 23 19* 25  --  25  GLUCOSE  --  168* 175* 90 162*  --  107*  BUN  --  87* 53* 67* 43*  --  55*  CREATININE  --   16.19* 9.68* 11.23* 8.26*  --  9.40*  CALCIUM  --  9.9 9.1 9.0 8.8*  --  8.8*  PHOS 1.0* 3.1  --  4.0 4.7*  --  4.2    GFR: Estimated Creatinine Clearance: 6 mL/min (A) (by C-G formula based on SCr of 9.4 mg/dL (H)).  Liver Function Tests: Recent Labs  Lab 02/18/21 1006 02/19/21 1209 02/20/21 0018  ALBUMIN 3.0* 3.0* 2.9*    CBG: No results for input(s): GLUCAP in the last 168 hours.   Recent Results (from the past 240 hour(s))  Resp Panel by RT-PCR (Flu A&B, Covid) Nasopharyngeal Swab     Status: None   Collection Time: 02/14/21 11:27 AM   Specimen: Nasopharyngeal Swab; Nasopharyngeal(NP) swabs in vial transport medium  Result Value Ref Range Status   SARS Coronavirus 2 by RT PCR NEGATIVE NEGATIVE Final    Comment: (NOTE) SARS-CoV-2 target nucleic acids are NOT DETECTED.  The SARS-CoV-2 RNA is generally detectable in upper respiratory specimens during the acute phase of infection. The lowest concentration of SARS-CoV-2 viral copies this assay can detect is 138 copies/mL. A negative result does not preclude SARS-Cov-2 infection and should not be used as the sole basis for treatment or other patient management decisions. A negative result may occur with  improper specimen collection/handling, submission of specimen other than nasopharyngeal swab, presence of viral mutation(s) within the areas targeted by this assay, and inadequate number of viral copies(<138 copies/mL). A negative result must be combined with clinical observations, patient history, and epidemiological information. The expected result is Negative.  Fact Sheet for Patients:  EntrepreneurPulse.com.au  Fact Sheet for Healthcare Providers:  IncredibleEmployment.be  This test is no t yet approved or cleared by the [place] FDA and  has been authorized for detection and/or diagnosis of SARS-CoV-2 by FDA under an Emergency Use Authorization (EUA). This EUA will remain   in effect (meaning this test can be used) for the duration of the COVID-19 declaration under Section 564(b)(1) of the Act, 21 U.S.C.section 360bbb-3(b)(1), unless the authorization is terminated  or revoked sooner.       Influenza A by PCR NEGATIVE NEGATIVE Final   Influenza B by PCR NEGATIVE NEGATIVE Final    Comment: (NOTE) The Xpert Xpress SARS-CoV-2/FLU/RSV plus assay is intended as an aid in the diagnosis of influenza from Nasopharyngeal swab specimens and should not be used as a sole basis for treatment. Nasal washings and aspirates are unacceptable for Xpert Xpress SARS-CoV-2/FLU/RSV testing.  Fact Sheet for Patients: EntrepreneurPulse.com.au  Fact Sheet for Healthcare Providers: IncredibleEmployment.be  This test is not yet approved or cleared  by the [place] and has been authorized for detection and/or diagnosis of SARS-CoV-2 by FDA under an Emergency Use Authorization (EUA). This EUA will remain in effect (meaning this test can be used) for the duration of the COVID-19 declaration under Section 564(b)(1) of the Act, 21 U.S.C. section 360bbb-3(b)(1), unless the authorization is terminated or revoked.  Performed at [facility], [address] [place], [place] [address]   CSF culture w Gram Stain     Status: None   Collection Time: 02/14/21  5:24 PM   Specimen: CSF; Cerebrospinal Fluid  Result Value Ref Range Status   Specimen Description CSF  Final   Special Requests NONE  Final   Gram Stain   Final    WBC PRESENT, PREDOMINANTLY MONONUCLEAR NO ORGANISMS SEEN CYTOSPIN SMEAR    Culture   Final    NO GROWTH 3 DAYS Performed at [facility], [place] [address], [place], [place] [address]    Report Status 02/18/2021 FINAL  Final  Blood Culture (routine x 2)     Status: Abnormal   Collection Time: 02/14/21  5:30 PM   Specimen: BLOOD  Result Value Ref Range Status   Specimen Description BLOOD RIGHT ANTECUBITAL   Final   Special Requests   Final    BOTTLES DRAWN AEROBIC AND ANAEROBIC Blood Culture adequate volume   Culture  Setup Time   Final    GRAM POSITIVE COCCI IN BOTH AEROBIC AND ANAEROBIC BOTTLES CRITICAL RESULT CALLED TO, READ BACK BY AND VERIFIED WITH: PHARMD J [place] [id number] AT 60 BY CM    Culture (A)  Final    STAPHYLOCOCCUS HOMINIS STAPHYLOCOCCUS EPIDERMIDIS THE SIGNIFICANCE OF ISOLATING THIS ORGANISM FROM A SINGLE SET OF BLOOD CULTURES WHEN MULTIPLE SETS ARE DRAWN IS UNCERTAIN. PLEASE NOTIFY THE MICROBIOLOGY DEPARTMENT WITHIN ONE WEEK IF SPECIATION AND SENSITIVITIES ARE REQUIRED. Performed at [facility], [place] [address], [place],  [address]    Report Status 02/17/2021 FINAL  Final  Blood Culture ID Panel (Reflexed)     Status: Abnormal   Collection Time: 02/14/21  5:30 PM  Result Value Ref Range Status   Enterococcus faecalis NOT DETECTED NOT DETECTED Final   Enterococcus Faecium NOT DETECTED NOT DETECTED Final   Listeria monocytogenes NOT DETECTED NOT DETECTED Final   Staphylococcus species DETECTED (A) NOT DETECTED Final    Comment: CRITICAL RESULT CALLED TO, READ BACK BY AND VERIFIED WITH: PHARMD J Iran [id number] AT 1335 BY CM    Staphylococcus aureus (BCID) NOT DETECTED NOT DETECTED Final   Staphylococcus epidermidis DETECTED (A) NOT DETECTED Final    Comment: CRITICAL RESULT CALLED TO, READ BACK BY AND VERIFIED WITH: PHARMD J [place] [id number] AT 1335 BY CM    Staphylococcus lugdunensis NOT DETECTED NOT DETECTED Final   Streptococcus species NOT DETECTED NOT DETECTED Final   Streptococcus agalactiae NOT DETECTED NOT DETECTED Final   Streptococcus pneumoniae NOT DETECTED NOT DETECTED Final   Streptococcus pyogenes NOT DETECTED NOT DETECTED Final   A.calcoaceticus-baumannii NOT DETECTED NOT DETECTED Final   Bacteroides fragilis NOT DETECTED NOT DETECTED Final   Enterobacterales NOT DETECTED NOT DETECTED Final   Enterobacter cloacae complex NOT DETECTED NOT  DETECTED Final   Escherichia coli NOT DETECTED NOT DETECTED Final   Klebsiella aerogenes NOT DETECTED NOT DETECTED Final   Klebsiella oxytoca NOT DETECTED NOT DETECTED Final   Klebsiella pneumoniae NOT DETECTED NOT DETECTED Final   Proteus species NOT DETECTED NOT DETECTED Final   Salmonella species NOT DETECTED  NOT DETECTED Final   Serratia marcescens NOT DETECTED NOT DETECTED Final   Haemophilus influenzae NOT DETECTED NOT DETECTED Final   Neisseria meningitidis NOT DETECTED NOT DETECTED Final   Pseudomonas aeruginosa NOT DETECTED NOT DETECTED Final   Stenotrophomonas maltophilia NOT DETECTED NOT DETECTED Final   Candida albicans NOT DETECTED NOT DETECTED Final   Candida auris NOT DETECTED NOT DETECTED Final   Candida glabrata NOT DETECTED NOT DETECTED Final   Candida krusei NOT DETECTED NOT DETECTED Final   Candida parapsilosis NOT DETECTED NOT DETECTED Final   Candida tropicalis NOT DETECTED NOT DETECTED Final   Cryptococcus neoformans/gattii NOT DETECTED NOT DETECTED Final   Methicillin resistance mecA/C NOT DETECTED NOT DETECTED Final    Comment: Performed at [facility], [place] [address], [place], [place] [address]  MRSA Next Gen by PCR, Nasal     Status: None   Collection Time: 02/14/21 10:34 PM   Specimen: Nasal Mucosa; Nasal Swab  Result Value Ref Range Status   MRSA by PCR Next Gen NOT DETECTED NOT DETECTED Final    Comment: (NOTE) The GeneXpert MRSA Assay (FDA approved for NASAL specimens only), is one component of a comprehensive MRSA colonization surveillance program. It is not intended to diagnose MRSA infection nor to guide or monitor treatment for MRSA infections. Test performance is not FDA approved in patients less than 63 years old. Performed at [facility], [place] [address] [place], [place] [address]   Blood Culture (routine x 2)     Status: None (Preliminary result)   Collection Time: 02/14/21 11:14 PM   Specimen: BLOOD  Result Value Ref Range  Status   Specimen Description BLOOD RIGHT ARM  Final   Special Requests   Final    BOTTLES DRAWN AEROBIC AND ANAEROBIC Blood Culture results may not be optimal due to an inadequate volume of blood received in culture bottles   Culture   Final    NO GROWTH 4 DAYS Performed at [facility], [place] [address] [place], [place] [address]    Report Status PENDING  Incomplete  Culture, blood (Routine X 2) w Reflex to ID Panel     Status: None (Preliminary result)   Collection Time: 02/17/21  5:04 AM   Specimen: BLOOD  Result Value Ref Range Status   Specimen Description BLOOD THUMB  Final   Special Requests   Final    BOTTLES DRAWN AEROBIC ONLY Blood Culture adequate volume   Culture   Final    NO GROWTH 2 DAYS Performed at [facility], [place] [address], [place], [place] [address]    Report Status PENDING  Incomplete  Culture, blood (Routine X 2) w Reflex to ID Panel     Status: None (Preliminary result)   Collection Time: 02/17/21  5:04 AM   Specimen: BLOOD RIGHT HAND  Result Value Ref Range Status   Specimen Description BLOOD RIGHT HAND  Final   Special Requests   Final    BOTTLES DRAWN AEROBIC ONLY Blood Culture adequate volume   Culture   Final    NO GROWTH 2 DAYS Performed at [facility], [place] [address], [place], [place] [address]    Report Status PENDING  Incomplete     Radiology Studies: No results found.   Scheduled Meds:   acidophilus  1 capsule Oral TID   calcitRIOL  0.25 mcg Oral Q M,W,F-HD   Chlorhexidine Gluconate Cloth  6 each Topical Q0600   cinacalcet  30 mg Oral Q supper   DULoxetine  30 mg Oral Daily   fidaxomicin  200 mg Oral BID   [START ON 02/21/2021] fidaxomicin  200 mg Oral Q48H   gabapentin  300 mg Oral QHS   heparin  5,000 Units Subcutaneous Q12H   lidocaine-prilocaine  1 application Topical Q M,W,F-HD   midodrine  10 mg Oral Q M,W,F   multivitamin  1 tablet Oral Daily   pantoprazole  40 mg Oral Daily   Continuous Infusions:    LOS: 6 days   Signature  Lala Lund M.D on 02/20/2021 at 8:31 AM   -  To page go to [email or web address]

## 2021-02-20 NOTE — Progress Notes (Signed)
Nephrology Progress Note   Assessment/Recommendations: Megan [name] is a/an 60 y.o. female with a past medical history significant for HTN, CHF, DM 2, breast cancer, ESRD, admitted for fevers and dizziness.     Outpatient dialysis: MWF   [facility] T 3: 30  EDW 73.5 kg   Optiflux 180 NRe  2K Ca 2.5     Darbepoetin 120 mcg  Weekly  Last dose 6/27 (recently incr from 100) Iron  50 mcg weekly Heparin 2300U qtx Calcitriol 0.25 mcg PO with HD  Dizziness/fevers/C. difficile: Initial concern for meningitis.  Now felt to be recurrence of C. difficile.  On fidaxomicin.  Management per primary and ID  ESRD: HD per usual MWF schedule.  Heparin was resumed with HD for last tx as had clotting previously this hospitalization.   Volume: holding home meds as below given hypotension.  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  Iron sat 15 but ferritin 1742.  No iron.  No ESA yet.  Last dose 6/27 as above (outpt)  Secondary hyperparathyroidism: had been holding binders given normal phosphorus and GI issues above - resume at reduced dose of fosrenol 1000 mg TID with meals.  resumed home calcitriol and continue Sensipar  Breast cancer: Status post bilateral mastectomy and radiation therapy.  Hypotension: setting of fluid losses with acute illness.  Hold amlodipine and nebivolol (reported home meds).   Hypokalemia - repleted and improved.  Follow trends and would be hesitant to change outpatient regimen though may require higher K bath here while recovering from acute illness  Disposition per primary team     ___________________________________________________  CC: Fevers  Interval History/Subjective:  Last HD 7/8 with 1 kg UF.  She had limited duration fluids on 7/9 for orthostatic hypotension.  She confirms normally takes fosrenol three 1000 mg tabs with meals and it doesn't bother her.  Spoke with her husband at bedside.  Review of  systems:   Denies shortness of breath; no cp Some dizziness with standing but much better Has had diarrhea - better overall but still bothersome; no n/v  Medications:  Current Facility-Administered Medications  Medication Dose Route Frequency Provider Last Rate Last Admin   acetaminophen (TYLENOL) tablet 650 mg  650 mg Oral Q6H PRN Elgergawy, Silver Huguenin, MD   650 mg at 02/19/21 0638   acidophilus (RISAQUAD) capsule 1 capsule  1 capsule Oral TID Varney Biles, MD   1 capsule at 02/19/21 2040   calcitRIOL (ROCALTROL) capsule 0.25 mcg  0.25 mcg Oral Q M,W,F-HD Claudia Desanctis, MD   0.25 mcg at 02/18/21 1653   Chlorhexidine Gluconate Cloth 2 % PADS 6 each  6 each Topical Q0600 Dwana Melena, MD   6 each at 02/19/21 0715   cinacalcet (SENSIPAR) tablet 30 mg  30 mg Oral Q supper Wynetta Fines T, MD   30 mg at 02/19/21 1716   DULoxetine (CYMBALTA) DR capsule 30 mg  30 mg Oral Daily Wynetta Fines T, MD   30 mg at 02/19/21 1008   fidaxomicin (DIFICID) tablet 200 mg  200 mg Oral BID Thurnell Lose, MD   200 mg at 02/19/21 2038   [START ON 02/21/2021] fidaxomicin (DIFICID) tablet 200 mg  200 mg Oral Q48H Thurnell Lose, MD       gabapentin (NEURONTIN) capsule 300 mg  300 mg Oral QHS Wynetta Fines T, MD   300 mg at 02/19/21 2038   heparin injection 5,000 Units  5,000 Units Subcutaneous Q12H Wynetta Fines T, MD   5,000 Units at 02/19/21 2042   labetalol (NORMODYNE) injection 10 mg  10 mg Intravenous Q6H PRN Wynetta Fines T, MD       lidocaine-prilocaine (EMLA) cream 1 application  1 application Topical Q M,W,F-HD Wynetta Fines T, MD       meclizine (ANTIVERT) tablet 12.5 mg  12.5 mg Oral TID PRN Wynetta Fines T, MD   12.5 mg at 02/19/21 4742   midodrine (PROAMATINE) tablet 10 mg  10 mg Oral Q M,W,F Thurnell Lose, MD   10 mg at 02/18/21 0955   multivitamin (RENA-VIT) tablet 1 tablet  1 tablet Oral Daily Wynetta Fines T, MD   1 tablet at 02/19/21 2040   pantoprazole (PROTONIX) EC tablet 40 mg  40 mg Oral Daily Wynetta Fines T, MD   40 mg at 02/19/21 1008    Physical Exam: Vitals:   02/20/21 0329 02/20/21 0759  BP: 128/79 (!) 146/86  Pulse: 83 79  Resp: 17 13  Temp: 98.5 F (36.9 C) (!) 97.5 F (36.4 C)  SpO2: 97% 98%   No intake/output data recorded.  Intake/Output Summary (Last 24 hours) at 02/20/2021 0957 Last data filed at 02/19/2021 1700 Gross per 24 hour  Intake 761.17 ml  Output --  Net 761.17 ml   General adult female in bed in no acute distress  HEENT normocephalic atraumatic extraocular movements intact sclera anicteric Neck supple trachea midline Lungs clear to auscultation bilaterally normal work of breathing at rest  Heart S1S2 no rub Abdomen soft nontender nondistended Extremities no edema  Psych normal mood and affect Neuro - alert and oriented x 3 provides hx and follows commands Access; LUE AVF b/t  Test Results I personally reviewed new and old clinical labs and radiology tests Lab Results  Component Value Date   NA 138 02/20/2021   K 3.7 02/20/2021   CL 100 02/20/2021   CO2 25 02/20/2021   BUN 55 (H) 02/20/2021   CREATININE 9.40 (H) 02/20/2021   CALCIUM 8.8 (L) 02/20/2021   ALBUMIN 2.9 (L) 02/20/2021   PHOS 4.2 02/20/2021     Claudia Desanctis, MD 02/20/2021 10:18 AM

## 2021-02-21 ENCOUNTER — Ambulatory Visit (HOSPITAL_COMMUNITY): Payer: TRICARE For Life (TFL) | Admitting: Hematology

## 2021-02-21 ENCOUNTER — Other Ambulatory Visit (HOSPITAL_COMMUNITY): Payer: Self-pay

## 2021-02-21 LAB — CBC
HCT: 32.2 % — ABNORMAL LOW (ref 36.0–46.0)
Hemoglobin: 10.1 g/dL — ABNORMAL LOW (ref 12.0–15.0)
MCH: 32.2 pg (ref 26.0–34.0)
MCHC: 31.4 g/dL (ref 30.0–36.0)
MCV: 102.5 fL — ABNORMAL HIGH (ref 80.0–100.0)
Platelets: 251 10*3/uL (ref 150–400)
RBC: 3.14 MIL/uL — ABNORMAL LOW (ref 3.87–5.11)
RDW: 15.5 % (ref 11.5–15.5)
WBC: 7.6 10*3/uL (ref 4.0–10.5)
nRBC: 0 % (ref 0.0–0.2)

## 2021-02-21 LAB — RENAL FUNCTION PANEL
Albumin: 2.8 g/dL — ABNORMAL LOW (ref 3.5–5.0)
Anion gap: 11 (ref 5–15)
BUN: 76 mg/dL — ABNORMAL HIGH (ref 6–20)
CO2: 20 mmol/L — ABNORMAL LOW (ref 22–32)
Calcium: 8.3 mg/dL — ABNORMAL LOW (ref 8.9–10.3)
Chloride: 101 mmol/L (ref 98–111)
Creatinine, Ser: 12.12 mg/dL — ABNORMAL HIGH (ref 0.44–1.00)
GFR, Estimated: 3 mL/min — ABNORMAL LOW (ref >60)
Glucose, Bld: 127 mg/dL — ABNORMAL HIGH (ref 70–99)
Phosphorus: 4.7 mg/dL — ABNORMAL HIGH (ref 2.5–4.6)
Potassium: 3.5 mmol/L (ref 3.5–5.1)
Sodium: 132 mmol/L — ABNORMAL LOW (ref 135–145)

## 2021-02-21 MED ORDER — HEPARIN SODIUM (PORCINE) 1000 UNIT/ML IJ SOLN: 2500.0000 [IU] | Freq: Once | INTRAMUSCULAR | Status: AC

## 2021-02-21 MED ORDER — HEPARIN SODIUM (PORCINE) 1000 UNIT/ML IJ SOLN
INTRAMUSCULAR | Status: AC
  Administered 2021-02-21: 2500 [IU] via INTRAVENOUS
  Filled 2021-02-21: qty 3

## 2021-02-21 MED ORDER — CALCITRIOL 0.25 MCG PO CAPS
ORAL_CAPSULE | ORAL | Status: AC
  Filled 2021-02-21: qty 1

## 2021-02-21 NOTE — Discharge Instructions (Signed)
Follow with Primary MD [name], MD in 7 days   Get CBC, CMP, 2 view Chest X ray -  checked next visit within 1 week by Primary MD   Activity: As tolerated with Full fall precautions use walker/cane & assistance as needed  Disposition Home   Diet: Renal, 1.5 lit/day fluid restriction  Special Instructions: If you have smoked or chewed Tobacco  in the last 2 yrs please stop smoking, stop any regular Alcohol  and or any Recreational drug use.  On your next visit with your primary care physician please Get Medicines reviewed and adjusted.  Please request your Prim.MD to go over all Hospital Tests and Procedure/Radiological results at the follow up, please get all Hospital records sent to your Prim MD by signing hospital release before you go home.  If you experience worsening of your admission symptoms, develop shortness of breath, life threatening emergency, suicidal or homicidal thoughts you must seek medical attention immediately by calling 911 or calling your MD immediately  if symptoms less severe.  You Must read complete instructions/literature along with all the possible adverse reactions/side effects for all the Medicines you take and that have been prescribed to you. Take any new Medicines after you have completely understood and accpet all the possible adverse reactions/side effects.

## 2021-02-21 NOTE — Discharge Summary (Signed)
Megan [name] BTD:[id number] DOB: [birth year] DOA: 02/14/2021  PCP: Megan Frizzle, MD  Admit date: 02/14/2021  Discharge date: 02/21/2021  Admitted From: Home  Disposition:  Home   Recommendations for Outpatient Follow-up:   Follow up with PCP in 1-2 weeks  PCP Please obtain BMP/CBC, 2 view CXR in 1week,  (see Discharge instructions)   PCP Please follow up on the following pending results:    Home Health: None   Equipment/Devices: None  Consultations: ID, Renal Discharge Condition: Stable    CODE STATUS: Full    Diet Recommendation:   Diet Order             Diet renal with fluid restriction Fluid restriction: 2000 mL Fluid; Room service appropriate? Yes; Fluid consistency: Thin  Diet effective now                    No chief complaint on file.    Brief history of present illness from the day of admission and additional interim summary    Megan [name] is a 60 y.o. 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  Work-up was significant for fever, SIRS, apparently on meningitis coverage given altered mental status, but her negative for infectious process, MRI with no acute process as well, she is ESRD, seen by renal, hemodialysis 7/5, as well followed by ID.                                                                 Hospital Course   SIRS  -  Due to of recurrent C. Difficile colitis, she has been seen by ID.  Currently on fidaxomicin.  1 out of 2 blood cultures growing staph epidermis likely contamination.  Clinically her  diarrhea has now considerably improved with bowel movements down to 1-2 semiformed stools, continue Dificid taper and discharged home with close outpatient PCP follow-up.   Vertigo - Stroke ruled out.  MRI brain with no acute findings, this was due to orthostatic hypotension after IV fluids and TED stockings this has resolved as blood pressure improved.  HTN - BP better resume Home meds upon DC.   ESRD on HD - Renal consulted, HD per schedule.   Breast cancer - Status post bilateral mastectomy and radiation therapy.  Outpatient oncology follow-up.     Discharge diagnosis     Principal Problem:   C. difficile colitis Active Problems:   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[place])   ESRD (end stage renal disease) on dialysis ([place])   Sepsis ([place])    Discharge instructions  Discharge Instructions     Discharge instructions   Complete by: As directed    Follow with Primary MD Megan Frizzle, MD in 7 days   Get CBC, CMP, 2 view Chest X ray -  checked next visit within 1 week by Primary MD   Activity: As tolerated with Full fall precautions use walker/cane & assistance as needed  Disposition Home   Diet: Renal, 1.5 lit/day fluid restriction  Special Instructions: If you have smoked or chewed Tobacco  in the last 2 yrs please stop smoking, stop any regular Alcohol  and or any Recreational drug use.  On your next visit with your primary care physician please Get Medicines reviewed and adjusted.  Please request your Prim.MD to go over all Hospital Tests and Procedure/Radiological results at the follow up, please get all Hospital records sent to your Prim MD by signing hospital release before you go home.  If you experience worsening of your admission symptoms, develop shortness of breath, life threatening emergency, suicidal or homicidal thoughts you must seek medical attention immediately by calling 911 or calling your MD immediately  if symptoms  less severe.  You Must read complete instructions/literature along with all the possible adverse reactions/side effects for all the Medicines you take and that have been prescribed to you. Take any new Medicines after you have completely understood and accpet all the possible adverse reactions/side effects.   Increase activity slowly   Complete by: As directed        Discharge Medications   Allergies as of 02/21/2021   No Known Allergies      Medication List     STOP taking these medications    cholestyramine 4 g packet Commonly known as: Questran   hydrALAZINE 25 MG tablet Commonly known as: APRESOLINE   HYDROcodone-acetaminophen 5-325 MG tablet Commonly known as: Norco   losartan 100 MG tablet Commonly known as: COZAAR   vancomycin 125 MG capsule Commonly known as: VANCOCIN       TAKE these medications    amLODipine 10 MG tablet Commonly known as: NORVASC TAKE 1 TABLET BY MOUTH AT BEDTIME   anastrozole 1 MG tablet Commonly known as: ARIMIDEX TAKE 1 TABLET(1 MG) BY MOUTH DAILY   aspirin EC 81 MG tablet Take 81 mg by mouth daily.   atorvastatin 20 MG tablet Commonly known as: LIPITOR Take 1 tablet (20 mg total) by mouth daily. What changed: when to take this   cinacalcet 30 MG tablet Commonly known as: SENSIPAR Take 2 tablets (60 mg total) by mouth daily. What changed:  how much to take when to take this   Dialyvite 800 0.8 MG Tabs Take 1 tablet by mouth daily.   DULoxetine 30 MG capsule Commonly known as: CYMBALTA TAKE 1 CAPSULE(30 MG) BY MOUTH DAILY   fidaxomicin 200 MG Tabs tablet Commonly known as: DIFICID Take 1 tablet (200 mg total) by mouth 2 (two) times daily for 2 days. What changed: Another medication with the same name was added. Make sure you understand how and when to take each.   Dificid 200 MG Tabs tablet Generic drug: fidaxomicin Take 1 tablet (200 mg total) by mouth every other day. Start taking on: February 23, 2021 What  changed: You were already taking a medication with the same name, and this prescription was added. Make sure you understand how and when to take each.   gabapentin 300 MG capsule Commonly known as: NEURONTIN TAKE 1 CAPSULE(300 MG) BY MOUTH AT BEDTIME What  changed: See the new instructions.   lanthanum 1000 MG chewable tablet Commonly known as: FOSRENOL Chew 2,000-3,000 mg by mouth See admin instructions. Take 3 tablets (3000 mg) by mouth with meals and take 2 tablets (2000 mg) with snacks   lidocaine-prilocaine cream Commonly known as: EMLA Apply 1 application topically every Monday, Wednesday, and Friday with hemodialysis.   nebivolol 10 MG tablet Commonly known as: Bystolic Take 1 tablet (10 mg total) by mouth in the morning and at bedtime.   pantoprazole 40 MG tablet Commonly known as: PROTONIX TAKE 1 TABLET(40 MG) BY MOUTH DAILY       ASK your doctor about these medications    saccharomyces boulardii 250 MG capsule Commonly known as: Florastor Take 1 capsule (250 mg total) by mouth 2 (two) times daily.         Follow-up Information     Megan Frizzle, MD. Schedule an appointment as soon as possible for a visit in 1 week(s).   Specialty: Family Medicine Contact information: [address] [place] [address] [phone]         Arnoldo Lenis, MD .   Specialty: Cardiology Contact information: [address] [place] [address] [phone]                 Major procedures and Radiology Reports - PLEASE review detailed and final reports thoroughly  -      CT Head Wo Contrast  Result Date: 02/14/2021 CLINICAL DATA:  Pt c/o being dizzy when walking since Saturday. Pt also c/o headache since SaturdayPatient complains of pressure to both ears and head. Denies dizziness when laying down greater dizziness when standing or sitting up.Denies weakness to arms or legs. Patient on dialysis. EXAM: CT HEAD WITHOUT CONTRAST TECHNIQUE:  Contiguous axial images were obtained from the base of the skull through the vertex without intravenous contrast. COMPARISON:  None. FINDINGS: Brain: No evidence of acute infarction, hemorrhage, hydrocephalus, extra-axial collection or mass lesion/mass effect. Vascular: No hyperdense vessel or unexpected calcification. Skull: Normal. Negative for fracture or focal lesion. Sinuses/Orbits: Globes and orbits are unremarkable. Visualized sinuses are clear. Other: None. IMPRESSION: Normal unenhanced CT scan of the brain. Electronically Signed   By: Lajean Manes M.D.   On: 02/14/2021 08:01   MR BRAIN WO CONTRAST  Result Date: 02/14/2021 CLINICAL DATA:  Dizziness EXAM: MRI HEAD WITHOUT CONTRAST TECHNIQUE: Multiplanar, multiecho pulse sequences of the brain and surrounding structures were obtained without intravenous contrast. COMPARISON:  October 2019 FINDINGS: Brain: There is no acute infarction or intracranial hemorrhage. There is no intracranial mass, mass effect, or edema. There is no hydrocephalus or extra-axial fluid collection. Ventricles and sulci are normal in size and configuration. Patchy foci of T2 hyperintensity in the supratentorial white matter nonspecific but may reflect minor chronic microvascular ischemic changes. Vascular: Major vessel flow voids at the skull base are preserved. Skull and upper cervical spine: Normal marrow signal is preserved. Sinuses/Orbits: Paranasal sinuses are aerated. Bilateral lens replacements. Other: Sella is unremarkable.  Mastoid air cells are clear. IMPRESSION: No evidence of recent infarction, hemorrhage, or mass. Probable minor chronic microvascular ischemic changes. Electronically Signed   By: Macy Mis M.D.   On: 02/14/2021 14:40   DG Chest Port 1 View  Result Date: 02/14/2021 CLINICAL DATA:  Evaluate for sepsis. EXAM: PORTABLE CHEST 1 VIEW COMPARISON:  07/03/2020 and older exams. FINDINGS: Cardiac silhouette is normal in size. No mediastinal or hilar masses.  Clear lungs.  No convincing  pleural effusion.  No pneumothorax. Stable changes from right breast surgery. Skeletal structures are grossly intact. IMPRESSION: No active disease. Electronically Signed   By: Lajean Manes M.D.   On: 02/14/2021 15:58   VAS [place] LOWER EXTREMITY VENOUS (DVT)  Result Date: 02/15/2021  Lower Venous DVT Study Patient Name:  RITAJ DULLEA  Date of Exam:   02/15/2021 Medical Rec #: [id number]            Accession #:    [id number] Date of Birth: Jul 07, 1961            Patient Gender: F Patient Age:   58Y Exam Location:  [facility] Procedure:      VAS [place] LOWER EXTREMITY VENOUS (DVT) Referring Phys: [id number] --------------------------------------------------------------------------------  Indications: Fever of unknown origin.  Comparison Study: No prior study Performing Technologist: Maudry Mayhew MHA, RDMS, RVT, RDCS  Examination Guidelines: A complete evaluation includes B-mode imaging, spectral Doppler, color Doppler, and power Doppler as needed of all accessible portions of each vessel. Bilateral testing is considered an integral part of a complete examination. Limited examinations for reoccurring indications may be performed as noted. The reflux portion of the exam is performed with the patient in reverse Trendelenburg.  +---------+---------------+---------+-----------+----------+--------------+ RIGHT    CompressibilityPhasicitySpontaneityPropertiesThrombus Aging +---------+---------------+---------+-----------+----------+--------------+ CFV      Full           Yes      Yes                                 +---------+---------------+---------+-----------+----------+--------------+ SFJ      Full                                                        +---------+---------------+---------+-----------+----------+--------------+ FV Prox  Full                                                         +---------+---------------+---------+-----------+----------+--------------+ FV Mid   Full                                                        +---------+---------------+---------+-----------+----------+--------------+ FV DistalFull                                                        +---------+---------------+---------+-----------+----------+--------------+ PFV      Full                                                        +---------+---------------+---------+-----------+----------+--------------+ POP      Full  Yes      Yes                                 +---------+---------------+---------+-----------+----------+--------------+ PTV      Full                                                        +---------+---------------+---------+-----------+----------+--------------+ PERO     Full                                                        +---------+---------------+---------+-----------+----------+--------------+   +---------+---------------+---------+-----------+----------+--------------+ LEFT     CompressibilityPhasicitySpontaneityPropertiesThrombus Aging +---------+---------------+---------+-----------+----------+--------------+ CFV      Full           Yes      Yes                                 +---------+---------------+---------+-----------+----------+--------------+ SFJ      Full                                                        +---------+---------------+---------+-----------+----------+--------------+ FV Prox  Full                                                        +---------+---------------+---------+-----------+----------+--------------+ FV Mid   Full                                                        +---------+---------------+---------+-----------+----------+--------------+ FV DistalFull                                                         +---------+---------------+---------+-----------+----------+--------------+ PFV      Full                                                        +---------+---------------+---------+-----------+----------+--------------+ POP      Full           Yes      Yes                                 +---------+---------------+---------+-----------+----------+--------------+ PTV  Full                                                        +---------+---------------+---------+-----------+----------+--------------+ PERO     Full                                                        +---------+---------------+---------+-----------+----------+--------------+     Summary: RIGHT: - There is no evidence of deep vein thrombosis in the lower extremity.  - No cystic structure found in the popliteal fossa.  LEFT: - There is no evidence of deep vein thrombosis in the lower extremity.  - No cystic structure found in the popliteal fossa.  *See table(s) above for measurements and observations. Electronically signed by Ruta Hinds MD on 02/15/2021 at 3:52:13 PM.    Final     Micro Results     Recent Results (from the past 240 hour(s))  Resp Panel by RT-PCR (Flu A&B, Covid) Nasopharyngeal Swab     Status: None   Collection Time: 02/14/21 11:27 AM   Specimen: Nasopharyngeal Swab; Nasopharyngeal(NP) swabs in vial transport medium  Result Value Ref Range Status   SARS Coronavirus 2 by RT PCR NEGATIVE NEGATIVE Final    Comment: (NOTE) SARS-CoV-2 target nucleic acids are NOT DETECTED.  The SARS-CoV-2 RNA is generally detectable in upper respiratory specimens during the acute phase of infection. The lowest concentration of SARS-CoV-2 viral copies this assay can detect is 138 copies/mL. A negative result does not preclude SARS-Cov-2 infection and should not be used as the sole basis for treatment or other patient management decisions. A negative result may occur with  improper specimen  collection/handling, submission of specimen other than nasopharyngeal swab, presence of viral mutation(s) within the areas targeted by this assay, and inadequate number of viral copies(<138 copies/mL). A negative result must be combined with clinical observations, patient history, and epidemiological information. The expected result is Negative.  Fact Sheet for Patients:  EntrepreneurPulse.com.au  Fact Sheet for Healthcare Providers:  IncredibleEmployment.be  This test is no t yet approved or cleared by the [place] FDA and  has been authorized for detection and/or diagnosis of SARS-CoV-2 by FDA under an Emergency Use Authorization (EUA). This EUA will remain  in effect (meaning this test can be used) for the duration of the COVID-19 declaration under Section 564(b)(1) of the Act, 21 U.S.C.section 360bbb-3(b)(1), unless the authorization is terminated  or revoked sooner.       Influenza A by PCR NEGATIVE NEGATIVE Final   Influenza B by PCR NEGATIVE NEGATIVE Final    Comment: (NOTE) The Xpert Xpress SARS-CoV-2/FLU/RSV plus assay is intended as an aid in the diagnosis of influenza from Nasopharyngeal swab specimens and should not be used as a sole basis for treatment. Nasal washings and aspirates are unacceptable for Xpert Xpress SARS-CoV-2/FLU/RSV testing.  Fact Sheet for Patients: EntrepreneurPulse.com.au  Fact Sheet for Healthcare Providers: IncredibleEmployment.be  This test is not yet approved or cleared by the [place] FDA and has been authorized for detection and/or diagnosis of SARS-CoV-2 by FDA under an Emergency Use Authorization (EUA). This EUA will remain in effect (meaning this test can be used) for  the duration of the COVID-19 declaration under Section 564(b)(1) of the Act, 21 U.S.C. section 360bbb-3(b)(1), unless the authorization is terminated or revoked.  Performed at [facility], [address] [place], [place] [address]   CSF culture w Gram Stain     Status: None   Collection Time: 02/14/21  5:24 PM   Specimen: CSF; Cerebrospinal Fluid  Result Value Ref Range Status   Specimen Description CSF  Final   Special Requests NONE  Final   Gram Stain   Final    WBC PRESENT, PREDOMINANTLY MONONUCLEAR NO ORGANISMS SEEN CYTOSPIN SMEAR    Culture   Final    NO GROWTH 3 DAYS Performed at [facility], [place] [address], [place], [place] [address]    Report Status 02/18/2021 FINAL  Final  Blood Culture (routine x 2)     Status: Abnormal   Collection Time: 02/14/21  5:30 PM   Specimen: BLOOD  Result Value Ref Range Status   Specimen Description BLOOD RIGHT ANTECUBITAL  Final   Special Requests   Final    BOTTLES DRAWN AEROBIC AND ANAEROBIC Blood Culture adequate volume   Culture  Setup Time   Final    GRAM POSITIVE COCCI IN BOTH AEROBIC AND ANAEROBIC BOTTLES CRITICAL RESULT CALLED TO, READ BACK BY AND VERIFIED WITH: PHARMD J [place] [id number] AT 1 BY CM    Culture (A)  Final    STAPHYLOCOCCUS HOMINIS STAPHYLOCOCCUS EPIDERMIDIS THE SIGNIFICANCE OF ISOLATING THIS ORGANISM FROM A SINGLE SET OF BLOOD CULTURES WHEN MULTIPLE SETS ARE DRAWN IS UNCERTAIN. PLEASE NOTIFY THE MICROBIOLOGY DEPARTMENT WITHIN ONE WEEK IF SPECIATION AND SENSITIVITIES ARE REQUIRED. Performed at [facility], [place] [address] [place], [place] [address]    Report Status 02/17/2021 FINAL  Final  Blood Culture ID Panel (Reflexed)     Status: Abnormal   Collection Time: 02/14/21  5:30 PM  Result Value Ref Range Status   Enterococcus faecalis NOT DETECTED NOT DETECTED Final   Enterococcus Faecium NOT DETECTED NOT DETECTED Final   Listeria monocytogenes NOT DETECTED NOT DETECTED Final   Staphylococcus species DETECTED (A) NOT DETECTED Final    Comment: CRITICAL RESULT CALLED TO, READ BACK BY AND VERIFIED WITH: PHARMD J [place] [id number] AT 1335 BY CM    Staphylococcus aureus (BCID) NOT  DETECTED NOT DETECTED Final   Staphylococcus epidermidis DETECTED (A) NOT DETECTED Final    Comment: CRITICAL RESULT CALLED TO, READ BACK BY AND VERIFIED WITH: PHARMD J [place] [id number] AT 1335 BY CM    Staphylococcus lugdunensis NOT DETECTED NOT DETECTED Final   Streptococcus species NOT DETECTED NOT DETECTED Final   Streptococcus agalactiae NOT DETECTED NOT DETECTED Final   Streptococcus pneumoniae NOT DETECTED NOT DETECTED Final   Streptococcus pyogenes NOT DETECTED NOT DETECTED Final   A.calcoaceticus-baumannii NOT DETECTED NOT DETECTED Final   Bacteroides fragilis NOT DETECTED NOT DETECTED Final   Enterobacterales NOT DETECTED NOT DETECTED Final   Enterobacter cloacae complex NOT DETECTED NOT DETECTED Final   Escherichia coli NOT DETECTED NOT DETECTED Final   Klebsiella aerogenes NOT DETECTED NOT DETECTED Final   Klebsiella oxytoca NOT DETECTED NOT DETECTED Final   Klebsiella pneumoniae NOT DETECTED NOT DETECTED Final   Proteus species NOT DETECTED NOT DETECTED Final   Salmonella species NOT DETECTED NOT DETECTED Final   Serratia marcescens NOT DETECTED NOT DETECTED Final   Haemophilus influenzae NOT DETECTED NOT DETECTED Final   Neisseria meningitidis NOT DETECTED NOT DETECTED Final   Pseudomonas aeruginosa NOT DETECTED  NOT DETECTED Final   Stenotrophomonas maltophilia NOT DETECTED NOT DETECTED Final   Candida albicans NOT DETECTED NOT DETECTED Final   Candida auris NOT DETECTED NOT DETECTED Final   Candida glabrata NOT DETECTED NOT DETECTED Final   Candida krusei NOT DETECTED NOT DETECTED Final   Candida parapsilosis NOT DETECTED NOT DETECTED Final   Candida tropicalis NOT DETECTED NOT DETECTED Final   Cryptococcus neoformans/gattii NOT DETECTED NOT DETECTED Final   Methicillin resistance mecA/C NOT DETECTED NOT DETECTED Final    Comment: Performed at [facility], [place] [address] [place], [place] [address]  MRSA Next Gen by PCR, Nasal     Status: None   Collection Time:  02/14/21 10:34 PM   Specimen: Nasal Mucosa; Nasal Swab  Result Value Ref Range Status   MRSA by PCR Next Gen NOT DETECTED NOT DETECTED Final    Comment: (NOTE) The GeneXpert MRSA Assay (FDA approved for NASAL specimens only), is one component of a comprehensive MRSA colonization surveillance program. It is not intended to diagnose MRSA infection nor to guide or monitor treatment for MRSA infections. Test performance is not FDA approved in patients less than 61 years old. Performed at [facility], [place] [address] [place], [place] [address]   Blood Culture (routine x 2)     Status: None   Collection Time: 02/14/21 11:14 PM   Specimen: BLOOD  Result Value Ref Range Status   Specimen Description BLOOD RIGHT ARM  Final   Special Requests   Final    BOTTLES DRAWN AEROBIC AND ANAEROBIC Blood Culture results may not be optimal due to an inadequate volume of blood received in culture bottles   Culture   Final    NO GROWTH 5 DAYS Performed at [facility], [place] [address] [place], [place] [address]    Report Status 02/20/2021 FINAL  Final  Culture, blood (Routine X 2) w Reflex to ID Panel     Status: None (Preliminary result)   Collection Time: 02/17/21  5:04 AM   Specimen: BLOOD  Result Value Ref Range Status   Specimen Description BLOOD THUMB  Final   Special Requests   Final    BOTTLES DRAWN AEROBIC ONLY Blood Culture adequate volume   Culture   Final    NO GROWTH 4 DAYS Performed at [facility], [place] [address] [place], [place] [address]    Report Status PENDING  Incomplete  Culture, blood (Routine X 2) w Reflex to ID Panel     Status: None (Preliminary result)   Collection Time: 02/17/21  5:04 AM   Specimen: BLOOD RIGHT HAND  Result Value Ref Range Status   Specimen Description BLOOD RIGHT HAND  Final   Special Requests   Final    BOTTLES DRAWN AEROBIC ONLY Blood Culture adequate volume   Culture   Final    NO GROWTH 4 DAYS Performed at [place], [place] [address], [place], [place] [address]    Report Status PENDING  Incomplete    Today   Subjective    Wanna Gully today has no headache,no chest abdominal pain,no new weakness tingling or numbness, feels much better wants to go home today.     Objective   Blood pressure (!) 156/93, pulse 81, temperature 97.7 F (36.5 C), temperature source Oral, resp. rate 19, height 5\' 2"  (1.575 m), weight 75.6 kg, SpO2 97 %.   Intake/Output Summary (Last 24 hours) at 02/21/2021 1154 Last data  filed at 02/20/2021 2010 Gross per 24 hour  Intake 120 ml  Output --  Net 120 ml    Exam  Awake Alert, No new F.N deficits, Normal affect [place].AT,PERRAL Supple Neck,No JVD, No cervical lymphadenopathy appriciated.  Symmetrical Chest wall movement, Good air movement bilaterally, CTAB RRR,No Gallops,Rubs or new Murmurs, No Parasternal Heave +ve B.Sounds, Abd Soft, Non tender, No organomegaly appriciated, No rebound -guarding or rigidity. No Cyanosis, Clubbing or edema, No new Rash or bruise   Data Review   CBC w Diff:  Lab Results  Component Value Date   WBC 7.6 02/21/2021   HGB 10.1 (L) 02/21/2021   HCT 32.2 (L) 02/21/2021   PLT 251 02/21/2021   LYMPHOPCT 16 02/14/2021   MONOPCT 13 02/14/2021   EOSPCT 2 02/14/2021   BASOPCT 1 02/14/2021    CMP:  Lab Results  Component Value Date   NA 132 (L) 02/21/2021   K 3.5 02/21/2021   CL 101 02/21/2021   CO2 20 (L) 02/21/2021   BUN 76 (H) 02/21/2021   CREATININE 12.12 (H) 02/21/2021   CREATININE 8.76 (H) 11/25/2020   PROT 8.3 (H) 01/19/2021   ALBUMIN 2.8 (L) 02/21/2021   BILITOT 0.8 01/19/2021   ALKPHOS 109 01/19/2021   AST 14 (L) 01/19/2021   ALT 22 01/19/2021  .   Total Time in preparing paper work, data evaluation and todays exam - 60 minutes  Lala Lund M.D on 02/21/2021 at 11:54 AM  [facility]

## 2021-02-21 NOTE — Progress Notes (Addendum)
[facility] Progress Note   Subjective:   Patient seen and examined at bedside during dialysis.  Tolerating treatment well.  Denies dizziness, n/v, weakness and fatigue.  Reports feeling much better.  Hopeful to go home today.   Objective Vitals:   02/21/21 0930 02/21/21 1000 02/21/21 1030 02/21/21 1100  BP: (!) 152/87 138/73 (!) 154/90 (!) 160/91  Pulse: 81 83 79 81  Resp:    19  Temp:      TempSrc:      SpO2:      Weight:      Height:       Physical Exam General:well appearing female in NAD Heart:RRR, no mrg Lungs:CTAB anterolaterally  Abdomen:soft, NTND Extremities:no LE edema Dialysis Access: LU AVF in use   Filed Weights   02/18/21 1354 02/18/21 1745 02/21/21 0831  Weight: 72.3 kg 71.3 kg 75.6 kg    Intake/Output Summary (Last 24 hours) at 02/21/2021 1119 Last data filed at 02/20/2021 2010 Gross per 24 hour  Intake 120 ml  Output --  Net 120 ml    Additional Objective Labs: Basic Metabolic Panel: Recent Labs  Lab 02/19/21 1209 02/19/21 1657 02/20/21 0018 02/21/21 0802  NA 135  --  138 132*  K 2.7* 3.5 3.7 3.5  CL 98  --  100 101  CO2 25  --  25 20*  GLUCOSE 162*  --  107* 127*  BUN 43*  --  55* 76*  CREATININE 8.26*  --  9.40* 12.12*  CALCIUM 8.8*  --  8.8* 8.3*  PHOS 4.7*  --  4.2 4.7*   Liver Function Tests: Recent Labs  Lab 02/19/21 1209 02/20/21 0018 02/21/21 0802  ALBUMIN 3.0* 2.9* 2.8*   No results for input(s): LIPASE, AMYLASE in the last 168 hours. CBC: Recent Labs  Lab 02/14/21 1720 02/15/21 0139 02/16/21 0154 02/20/21 0018 02/21/21 0802  WBC 10.8* 11.9* 14.8* 11.1* 7.6  NEUTROABS 7.3  --   --   --   --   HGB 11.0* 10.6* 11.3* 10.9* 10.1*  HCT 34.5* 33.6* 35.0* 34.4* 32.2*  MCV 103.6* 104.3* 100.9* 102.1* 102.5*  PLT 228 208 232 267 251   Blood Culture    Component Value Date/Time   SDES BLOOD THUMB 02/17/2021 0504   SDES BLOOD RIGHT HAND 02/17/2021 0504   SPECREQUEST  02/17/2021 0504    BOTTLES DRAWN  AEROBIC ONLY Blood Culture adequate volume   SPECREQUEST  02/17/2021 0504    BOTTLES DRAWN AEROBIC ONLY Blood Culture adequate volume   CULT  02/17/2021 0504    NO GROWTH 4 DAYS Performed at [facility], [place] [address], [place], [place] [address]    CULT  02/17/2021 0504    NO GROWTH 4 DAYS Performed at [facility], [place] [address] [place],  [address]    REPTSTATUS PENDING 02/17/2021 0504   REPTSTATUS PENDING 02/17/2021 0504    Cardiac Enzymes: No results for input(s): CKTOTAL, CKMB, CKMBINDEX, TROPONINI in the last 168 hours. CBG: No results for input(s): GLUCAP in the last 168 hours. Iron Studies: No results for input(s): IRON, TIBC, TRANSFERRIN, FERRITIN in the last 72 hours. Lab Results  Component Value Date   INR 1.2 02/14/2021   Studies/Results: No results found.  Medications:   acidophilus  1 capsule Oral TID   calcitRIOL  0.25 mcg Oral Q M,W,F-HD   Chlorhexidine Gluconate Cloth  6 each Topical Q0600   cinacalcet  30 mg Oral Q supper   DULoxetine  30 mg Oral Daily   fidaxomicin  200 mg Oral Q48H   gabapentin  300 mg Oral QHS   heparin  5,000 Units Subcutaneous Q12H   lanthanum  1,000 mg Oral TID WC   lidocaine-prilocaine  1 application Topical Q M,W,F-HD   midodrine  10 mg Oral Q M,W,F   multivitamin  1 tablet Oral Daily   pantoprazole  40 mg Oral Daily    Dialysis Orders: MWF   [facility] T 3: 30  EDW 73.5 kg   Optiflux 180 NRe  2K Ca 2.5     Darbepoetin 120 mcg  Weekly  Last dose 6/27 (recently incr from 100) Iron  50 mcg weekly Heparin 2300U qtx Calcitriol 0.25 mcg PO with HD  Background:  60 y.o. female with a past medical history significant for HTN, CHF, DM 2, breast cancer, ESRD, admitted for fevers and dizziness.     Assessment/Plan: 1. Dizziness/fevers/C Diff - initial concern for meningitis.  Now suspected recurrence of C diff. On fidaxomicin. Per PMD/ID. 2. ESRD -On MWF.  HD today per regular schedule.   3. Anemia of CKD- Hgb  10.9. No iron due to high ferritin. No ESA during admit. Due today, will have OP unit change to Wednesday.  4. Secondary hyperparathyroidism - Ca and phos at goal.  Continue VDRA and sensipar.  Binders initially held and restarted at lower dose, fosrenol 1000mg  TID with meals.  Likely will need to resume previous dose once at home and on normal diet.  5. HTN/volume - Initially hypotensive in setting of acute illness/diarrhea.  Mostly in goal over the last few days, elevated this AM.  Home meds held d/t hypotension, will likely need to resume.  Does not appear grossly volume overloaded. UF as tolerated.  6. Nutrition - Renal diet w/fluid restrictions.  7. Hypokalemia - improved.  8. Breast cancer - s/p bilateral mastectomy and radiation therapy.   Jen Mow, PA-C [facility] 02/21/2021,11:19 AM  LOS: 7 days

## 2021-02-21 NOTE — Progress Notes (Signed)
Megan [name] to be D/C'd Home per MD order.  Discussed with the patient and all questions fully answered.  VSS, Skin clean, dry and intact without evidence of skin break down, no evidence of skin tears noted. IV catheter discontinued intact. Site without signs and symptoms of complications. Dressing and pressure applied.  An After Visit Summary was printed and given to the patient. Patient received prescription.  D/c education completed with patient/family including follow up instructions, medication list, d/c activities limitations if indicated, with other d/c instructions as indicated by MD - patient able to verbalize understanding, all questions fully answered.   Patient instructed to return to ED, call 911, or call MD for any changes in condition.   Patient escorted via Megan [name], and D/C home via private auto.  Megan [name] 02/21/2021 2:04 PM

## 2021-02-22 ENCOUNTER — Encounter (HOSPITAL_COMMUNITY): Payer: Self-pay | Admitting: Hematology

## 2021-02-22 ENCOUNTER — Telehealth: Payer: Self-pay | Admitting: Nephrology

## 2021-02-22 DIAGNOSIS — N186 End stage renal disease: Secondary | ICD-10-CM | POA: Diagnosis not present

## 2021-02-22 DIAGNOSIS — N040 Nephrotic syndrome with minor glomerular abnormality: Secondary | ICD-10-CM | POA: Diagnosis not present

## 2021-02-22 DIAGNOSIS — Z992 Dependence on renal dialysis: Secondary | ICD-10-CM | POA: Diagnosis not present

## 2021-02-22 LAB — CULTURE, BLOOD (ROUTINE X 2)
Culture: NO GROWTH
Culture: NO GROWTH
Special Requests: ADEQUATE
Special Requests: ADEQUATE

## 2021-02-22 NOTE — Telephone Encounter (Signed)
   Transition of care contact from inpatient facility  Date of discharge: 02/21/21 Date of contact: 02/22/21 Method: Phone Spoke to: Patient  Patient contacted to discuss transition of care from recent inpatient hospitalization. Patient was admitted to [facility] from 02/14/21 to 02/21/21. with discharge diagnosis of SIRS( C. Diff. Colitis) /Vertigo= orthostatic hypotension resolved with fluids/HTN bp better with home meds at dc    Medication changes were reviewed.Has her Dificid  Patient will follow up with his/her outpatient HD unit on: 02/23/21

## 2021-02-23 ENCOUNTER — Encounter (HOSPITAL_COMMUNITY): Payer: Self-pay | Admitting: Hematology

## 2021-02-23 DIAGNOSIS — E1129 Type 2 diabetes mellitus with other diabetic kidney complication: Secondary | ICD-10-CM | POA: Diagnosis not present

## 2021-02-23 DIAGNOSIS — N186 End stage renal disease: Secondary | ICD-10-CM | POA: Diagnosis not present

## 2021-02-23 DIAGNOSIS — N2581 Secondary hyperparathyroidism of renal origin: Secondary | ICD-10-CM | POA: Diagnosis not present

## 2021-02-23 DIAGNOSIS — Z992 Dependence on renal dialysis: Secondary | ICD-10-CM | POA: Diagnosis not present

## 2021-02-23 DIAGNOSIS — D631 Anemia in chronic kidney disease: Secondary | ICD-10-CM | POA: Diagnosis not present

## 2021-02-23 DIAGNOSIS — D509 Iron deficiency anemia, unspecified: Secondary | ICD-10-CM | POA: Diagnosis not present

## 2021-02-23 DIAGNOSIS — R197 Diarrhea, unspecified: Secondary | ICD-10-CM | POA: Diagnosis not present

## 2021-02-23 DIAGNOSIS — R42 Dizziness and giddiness: Secondary | ICD-10-CM | POA: Diagnosis not present

## 2021-02-24 DIAGNOSIS — C50211 Malignant neoplasm of upper-inner quadrant of right female breast: Secondary | ICD-10-CM | POA: Diagnosis not present

## 2021-02-25 DIAGNOSIS — D509 Iron deficiency anemia, unspecified: Secondary | ICD-10-CM | POA: Diagnosis not present

## 2021-02-25 DIAGNOSIS — Z992 Dependence on renal dialysis: Secondary | ICD-10-CM | POA: Diagnosis not present

## 2021-02-25 DIAGNOSIS — E1129 Type 2 diabetes mellitus with other diabetic kidney complication: Secondary | ICD-10-CM | POA: Diagnosis not present

## 2021-02-25 DIAGNOSIS — N186 End stage renal disease: Secondary | ICD-10-CM | POA: Diagnosis not present

## 2021-02-25 DIAGNOSIS — D631 Anemia in chronic kidney disease: Secondary | ICD-10-CM | POA: Diagnosis not present

## 2021-02-25 DIAGNOSIS — N2581 Secondary hyperparathyroidism of renal origin: Secondary | ICD-10-CM | POA: Diagnosis not present

## 2021-02-28 DIAGNOSIS — D509 Iron deficiency anemia, unspecified: Secondary | ICD-10-CM | POA: Diagnosis not present

## 2021-02-28 DIAGNOSIS — N186 End stage renal disease: Secondary | ICD-10-CM | POA: Diagnosis not present

## 2021-02-28 DIAGNOSIS — E1129 Type 2 diabetes mellitus with other diabetic kidney complication: Secondary | ICD-10-CM | POA: Diagnosis not present

## 2021-02-28 DIAGNOSIS — N2581 Secondary hyperparathyroidism of renal origin: Secondary | ICD-10-CM | POA: Diagnosis not present

## 2021-02-28 DIAGNOSIS — D631 Anemia in chronic kidney disease: Secondary | ICD-10-CM | POA: Diagnosis not present

## 2021-02-28 DIAGNOSIS — Z992 Dependence on renal dialysis: Secondary | ICD-10-CM | POA: Diagnosis not present

## 2021-03-01 ENCOUNTER — Encounter (HOSPITAL_COMMUNITY): Payer: Self-pay | Admitting: Hematology

## 2021-03-01 ENCOUNTER — Ambulatory Visit (INDEPENDENT_AMBULATORY_CARE_PROVIDER_SITE_OTHER): Payer: Medicare Other | Admitting: Family Medicine

## 2021-03-01 ENCOUNTER — Other Ambulatory Visit: Payer: Self-pay

## 2021-03-01 ENCOUNTER — Encounter: Payer: Self-pay | Admitting: Family Medicine

## 2021-03-01 VITALS — BP 134/66 | HR 90 | Temp 98.6°F | Resp 18 | Ht 62.0 in | Wt 166.0 lb

## 2021-03-01 DIAGNOSIS — A0472 Enterocolitis due to Clostridium difficile, not specified as recurrent: Secondary | ICD-10-CM

## 2021-03-01 NOTE — Progress Notes (Signed)
For DEXA mycin s Subjective:    Patient ID: Megan [name], female    DOB: 1961-03-15, 60 y.o.   MRN: [id number] Patient was recently admitted with sepsis secondary to C. difficile colitis.  DC summary copied below:   Brief history of present illness from the day of admission and additional interim summary     Megan [name] is a 60 y.o. 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  Work-up was significant for fever, SIRS, apparently on meningitis coverage given altered mental status, but her negative for infectious process, MRI with no acute process as well, she is ESRD, seen by renal, hemodialysis 7/5, as well followed by ID.                                                                  Hospital Course    SIRS  -  Due to of recurrent C. Difficile colitis, she has been seen by ID.  Currently on fidaxomicin.  1 out of 2 blood cultures growing staph epidermis likely contamination.  Clinically her diarrhea has now considerably improved with bowel movements down to 1-2 semiformed stools, continue Dificid taper and discharged home with close outpatient PCP follow-up.   Vertigo - Stroke ruled out.  MRI brain with no acute findings, this was due to orthostatic hypotension after IV fluids and TED stockings this has resolved as blood pressure improved.   HTN - BP better resume Home meds upon DC.   ESRD on HD - Renal consulted, HD per schedule.   Breast cancer - Status post bilateral mastectomy and radiation therapy.  Outpatient oncology follow-up.       Discharge diagnosis       Principal Problem:   C. difficile colitis Active Problems:   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[place])    ESRD (end stage renal disease) on dialysis ([place])   Sepsis ([place])      Infectious disease was consulted in the hospital.  She was started on fidaxomicin in the hospital and was transition to 200 mg every other day for a total of 20 days upon discharge.  She states that she still having occasional diarrhea however the diarrhea has improved dramatically and she is no longer having dizziness or shaking chills or tremor.  Overall, she is feeling much stronger.  She denies any nausea or vomiting.  She denies any cough or chest pain.  She denies any abdominal pain or bloating. Past Medical History:  Diagnosis Date   (HFpEF) heart failure with preserved ejection fraction ([place])    a. 01/2019 Echo: EF 55-60%, mild conc LVH. DD.  Torn MV chordae.   Anemia    Atypical chest pain    a. 08/2018 MV: EF 59%, no ischemia; b. 02/2019 Cath: nonobs dzs.   Blood transfusion without reported diagnosis    Breast cancer ([place]) 10/12/2020   Cataract    ESRD (end stage renal disease) on dialysis [facility]    a. HD T, T, S   Essential hypertension, benign    GERD (gastroesophageal  reflux disease)    Headache    Hemorrhoids    Mixed hyperlipidemia    Morbid obesity (H[facility])    Non-obstructive CAD (coronary artery disease)    a. 02/2019 CathL LM nl, LAD 22m, LCX nl, RCA 25p, EF 55-65%.   PONV (postoperative nausea and vomiting)    S/P colonoscopy Jan 2011   Dr. Benson Norway: sessile polyp (benign lymphoid), large hemorrhoids, repeat 5-10 years   Temporal arteritis ([place])    Type 2 diabetes mellitus ([place])    Wears glasses    Past Surgical History:  Procedure Laterality Date   ABDOMINAL HYSTERECTOMY     APPENDECTOMY     ARTERY BIOPSY N/A 05/09/2018   Procedure: RIGHT TEMPORAL ARTERY BIOPSY;  Surgeon: Judeth Horn, MD;  Location: [facility];  Service: General;  Laterality: N/A;   BREAST BIOPSY Right 06/15/2020   Procedure: RIGHT BREAST BIOPSY;  Surgeon: Aviva Signs, MD;  Location: [facility];  Service: General;  Laterality: Right;    CATARACT EXTRACTION W/PHACO Left 02/09/2017   Procedure: CATARACT EXTRACTION PHACO AND INTRAOCULAR LENS PLACEMENT LEFT EYE;  Surgeon: Tonny Branch, MD;  Location: [facility];  Service: Ophthalmology;  Laterality: Left;  CDE: 4.89   CATARACT EXTRACTION W/PHACO Right 06/04/2017   Procedure: CATARACT EXTRACTION PHACO AND INTRAOCULAR LENS PLACEMENT (IOC);  Surgeon: Tonny Branch, MD;  Location: [facility];  Service: Ophthalmology;  Laterality: Right;  CDE: 4.12   CHOLECYSTECTOMY  09/29/2011   Procedure: LAPAROSCOPIC CHOLECYSTECTOMY;  Surgeon: Jamesetta So, MD;  Location: [facility];  Service: General;  Laterality: N/A;   COLONOSCOPY  Jan 2011   Dr. Benson Norway: sessile polyp (benign lymphoid), large hemorrhoids, repeat 5-10 years   COLONOSCOPY N/A 06/12/2016   Procedure: COLONOSCOPY;  Surgeon: Daneil Dolin, MD;  [facility];  Service: Endoscopy;  Laterality: N/A;  1230    ESOPHAGOGASTRODUODENOSCOPY  09/05/2011   XBW:IOMBT hiatal hernia; remainder of exam normal. No explanation for patient's abdominal pain with today's examination   ESOPHAGOGASTRODUODENOSCOPY N/A 12/17/2013   Dr. [name]: gastric erythema, erosion, mild chronic inflammation on path    EXCISION OF BREAST BIOPSY Right 10/12/2020   Procedure: EXCISION OF RIGHT BREAST BIOPSY;  Surgeon: Aviva Signs, MD;  [facility]: [facility];  Service: General;  Laterality: Right;   LAPAROSCOPIC APPENDECTOMY  09/29/2011   Procedure: APPENDECTOMY LAPAROSCOPIC;  Surgeon: Jamesetta So, MD;  Location: [facility];  Service: General;;  incidental appendectomy   LEFT HEART CATH AND CORONARY ANGIOGRAPHY N/A 02/28/2019   Procedure: LEFT HEART CATH AND CORONARY ANGIOGRAPHY;  Surgeon: Jettie Booze, MD;  Location: [facility];  Service: Cardiovascular;  Laterality: N/A;   MASTECTOMY MODIFIED RADICAL Right 02/18/2020   Procedure: MASTECTOMY MODIFIED RADICAL;  Surgeon: Aviva Signs, MD;  Location: [facility];  Service: General;  Laterality: Right;   MASTECTOMY, PARTIAL Right  07/13/2020   Procedure: RIGHT PARTIAL MASTECTOMY;  Surgeon: Aviva Signs, MD;  Location: [facility];  Service: General;  Laterality: Right;   PARTIAL MASTECTOMY WITH NEEDLE LOCALIZATION AND AXILLARY SENTINEL LYMPH NODE BX Right 09/18/2018   Procedure: RIGHT PARTIAL MASTECTOMY AFTER NEEDLE LOCALIZATION, SENTINEL LYMPH NODE BIOPSY RIGHT AXILLA;  Surgeon: Aviva Signs, MD;  [facility]: [facility];  Service: General;  Laterality: Right;   SIMPLE MASTECTOMY WITH AXILLARY SENTINEL NODE BIOPSY Left 06/15/2020   Procedure: LEFT SIMPLE MASTECTOMY;  Surgeon: Aviva Signs, MD;  [facility]: [facility];  Service: General;  Laterality: Left;   Current Outpatient Medications on File Prior to Visit  Medication Sig Dispense Refill  amLODipine (NORVASC) 10 MG tablet TAKE 1 TABLET BY MOUTH AT BEDTIME 90 tablet 3   anastrozole (ARIMIDEX) 1 MG tablet TAKE 1 TABLET(1 MG) BY MOUTH DAILY 30 tablet 4   aspirin EC 81 MG tablet Take 81 mg by mouth daily.     atorvastatin (LIPITOR) 20 MG tablet Take 1 tablet (20 mg total) by mouth daily. 90 tablet 3   B Complex-C-Folic Acid (DIALYVITE 299) 0.8 MG TABS Take 1 tablet by mouth daily.     cinacalcet (SENSIPAR) 30 MG tablet Take 2 tablets (60 mg total) by mouth daily. 90 tablet 0   DULoxetine (CYMBALTA) 30 MG capsule TAKE 1 CAPSULE(30 MG) BY MOUTH DAILY 90 capsule 1   fidaxomicin (DIFICID) 200 MG TABS tablet Take 1 tablet (200 mg total) by mouth every other day. 20 tablet 0   gabapentin (NEURONTIN) 300 MG capsule TAKE 1 CAPSULE(300 MG) BY MOUTH AT BEDTIME 90 capsule 1   lanthanum (FOSRENOL) 1000 MG chewable tablet Chew 2,000-3,000 mg by mouth See admin instructions. Take 3 tablets (3000 mg) by mouth with meals and take 2 tablets (2000 mg) with snacks     lidocaine-prilocaine (EMLA) cream Apply 1 application topically every Monday, Wednesday, and Friday with hemodialysis.      nebivolol (BYSTOLIC) 10 MG tablet Take 1 tablet (10 mg total) by mouth in the morning and at bedtime. 180 tablet 3    pantoprazole (PROTONIX) 40 MG tablet TAKE 1 TABLET(40 MG) BY MOUTH DAILY 90 tablet 3   saccharomyces boulardii (FLORASTOR) 250 MG capsule Take 1 capsule (250 mg total) by mouth 2 (two) times daily. 60 capsule 1   No current facility-administered medications on file prior to visit.   No Known Allergies Social History   Socioeconomic History   Marital status: Married    Spouse name: Not on file   Number of children: Not on file   Years of education: Not on file   Highest education level: Not on file  Occupational History   Occupation: Merchandiser, retail: [id number]  Tobacco Use   Smoking status: Never   Smokeless tobacco: Never  Vaping Use   Vaping Use: Never used  Substance and Sexual Activity   Alcohol use: No   Drug use: No   Sexual activity: Yes    Birth control/protection: Surgical  Other Topics Concern   Not on file  Social History Narrative   Works at [employer] in [place].    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   Also has to get items from high shelves-causes achy pain in shoulder area      Married.   Children are grown, out of house.   Social Determinants of Health   Financial Resource Strain: Not on file  Food Insecurity: Not on file  Transportation Needs: Not on file  Physical Activity: Not on file  Stress: Not on file  Social Connections: Not on file  Intimate Partner Violence: Not At Risk   Fear of Current or Ex-Partner: No   Emotionally Abused: No   Physically Abused: No   Sexually Abused: No    Past Medical History:  Diagnosis Date   (HFpEF) heart failure with preserved ejection fraction ([place])    a. 01/2019 Echo: EF 55-60%, mild conc LVH. DD.  Torn MV chordae.   Anemia    Atypical chest pain    a. 08/2018 MV: EF 59%, no ischemia; b. 02/2019 Cath: nonobs dzs.  Blood transfusion without reported diagnosis    Breast cancer ([place]) 10/12/2020   Cataract    ESRD (end stage renal disease) on dialysis [facility])    a. HD T, T, S    Essential hypertension, benign    GERD (gastroesophageal reflux disease)    Headache    Hemorrhoids    Mixed hyperlipidemia    Morbid obesity (H[facility])    Non-obstructive CAD (coronary artery disease)    a. 02/2019 CathL LM nl, LAD 56m, LCX nl, RCA 25p, EF 55-65%.   PONV (postoperative nausea and vomiting)    S/P colonoscopy Jan 2011   Dr. Benson Norway: sessile polyp (benign lymphoid), large hemorrhoids, repeat 5-10 years   Temporal arteritis ([place])    Type 2 diabetes mellitus ([place])    Wears glasses    Past Surgical History:  Procedure Laterality Date   ABDOMINAL HYSTERECTOMY     APPENDECTOMY     ARTERY BIOPSY N/A 05/09/2018   Procedure: RIGHT TEMPORAL ARTERY BIOPSY;  Surgeon: Judeth Horn, MD;  Location: [facility];  Service: General;  Laterality: N/A;   BREAST BIOPSY Right 06/15/2020   Procedure: RIGHT BREAST BIOPSY;  Surgeon: Aviva Signs, MD;  [facility];  Service: General;  Laterality: Right;   CATARACT EXTRACTION W/PHACO Left 02/09/2017   Procedure: CATARACT EXTRACTION PHACO AND INTRAOCULAR LENS PLACEMENT LEFT EYE;  Surgeon: Tonny Branch, MD;  [facility]: [facility];  Service: Ophthalmology;  Laterality: Left;  CDE: 4.89   CATARACT EXTRACTION W/PHACO Right 06/04/2017   Procedure: CATARACT EXTRACTION PHACO AND INTRAOCULAR LENS PLACEMENT (IOC);  Surgeon: Tonny Branch, MD;  [facility]: [facility];  Service: Ophthalmology;  Laterality: Right;  CDE: 4.12   CHOLECYSTECTOMY  09/29/2011   Procedure: LAPAROSCOPIC CHOLECYSTECTOMY;  Surgeon: Jamesetta So, MD;  [facility];  Service: General;  Laterality: N/A;   COLONOSCOPY  Jan 2011   Dr. Benson Norway: sessile polyp (benign lymphoid), large hemorrhoids, repeat 5-10 years   COLONOSCOPY N/A 06/12/2016   Procedure: COLONOSCOPY;  Surgeon: Daneil Dolin, MD;  [facility];  Service: Endoscopy;  Laterality: N/A;  1230    ESOPHAGOGASTRODUODENOSCOPY  09/05/2011   JJH:ERDEY hiatal hernia; remainder of exam normal. No explanation for patient's abdominal pain with  today's examination   ESOPHAGOGASTRODUODENOSCOPY N/A 12/17/2013   Dr. [name]: gastric erythema, erosion, mild chronic inflammation on path    EXCISION OF BREAST BIOPSY Right 10/12/2020   Procedure: EXCISION OF RIGHT BREAST BIOPSY;  Surgeon: Aviva Signs, MD;  [facility]: [facility];  Service: General;  Laterality: Right;   LAPAROSCOPIC APPENDECTOMY  09/29/2011   Procedure: APPENDECTOMY LAPAROSCOPIC;  Surgeon: Jamesetta So, MD;  [facility];  Service: General;;  incidental appendectomy   LEFT HEART CATH AND CORONARY ANGIOGRAPHY N/A 02/28/2019   Procedure: LEFT HEART CATH AND CORONARY ANGIOGRAPHY;  Surgeon: Jettie Booze, MD;  Location: [facility];  Service: Cardiovascular;  Laterality: N/A;   MASTECTOMY MODIFIED RADICAL Right 02/18/2020   Procedure: MASTECTOMY MODIFIED RADICAL;  Surgeon: Aviva Signs, MD;  [facility];  Service: General;  Laterality: Right;   MASTECTOMY, PARTIAL Right 07/13/2020   Procedure: RIGHT PARTIAL MASTECTOMY;  Surgeon: Aviva Signs, MD;  [facility]: [facility];  Service: General;  Laterality: Right;   PARTIAL MASTECTOMY WITH NEEDLE LOCALIZATION AND AXILLARY SENTINEL LYMPH NODE BX Right 09/18/2018   Procedure: RIGHT PARTIAL MASTECTOMY AFTER NEEDLE LOCALIZATION, SENTINEL LYMPH NODE BIOPSY RIGHT AXILLA;  Surgeon: Aviva Signs, MD;  [facility]: [facility];  Service: General;  Laterality: Right;   SIMPLE MASTECTOMY WITH  AXILLARY SENTINEL NODE BIOPSY Left 06/15/2020   Procedure: LEFT SIMPLE MASTECTOMY;  Surgeon: Aviva Signs, MD;  [facility]: [facility];  Service: General;  Laterality: Left;   Current Outpatient Medications on File Prior to Visit  Medication Sig Dispense Refill   amLODipine (NORVASC) 10 MG tablet TAKE 1 TABLET BY MOUTH AT BEDTIME 90 tablet 3   anastrozole (ARIMIDEX) 1 MG tablet TAKE 1 TABLET(1 MG) BY MOUTH DAILY 30 tablet 4   aspirin EC 81 MG tablet Take 81 mg by mouth daily.     atorvastatin (LIPITOR) 20 MG tablet Take 1 tablet (20 mg total) by mouth daily. 90  tablet 3   B Complex-C-Folic Acid (DIALYVITE 614) 0.8 MG TABS Take 1 tablet by mouth daily.     cinacalcet (SENSIPAR) 30 MG tablet Take 2 tablets (60 mg total) by mouth daily. 90 tablet 0   DULoxetine (CYMBALTA) 30 MG capsule TAKE 1 CAPSULE(30 MG) BY MOUTH DAILY 90 capsule 1   fidaxomicin (DIFICID) 200 MG TABS tablet Take 1 tablet (200 mg total) by mouth every other day. 20 tablet 0   gabapentin (NEURONTIN) 300 MG capsule TAKE 1 CAPSULE(300 MG) BY MOUTH AT BEDTIME 90 capsule 1   lanthanum (FOSRENOL) 1000 MG chewable tablet Chew 2,000-3,000 mg by mouth See admin instructions. Take 3 tablets (3000 mg) by mouth with meals and take 2 tablets (2000 mg) with snacks     lidocaine-prilocaine (EMLA) cream Apply 1 application topically every Monday, Wednesday, and Friday with hemodialysis.      nebivolol (BYSTOLIC) 10 MG tablet Take 1 tablet (10 mg total) by mouth in the morning and at bedtime. 180 tablet 3   pantoprazole (PROTONIX) 40 MG tablet TAKE 1 TABLET(40 MG) BY MOUTH DAILY 90 tablet 3   saccharomyces boulardii (FLORASTOR) 250 MG capsule Take 1 capsule (250 mg total) by mouth 2 (two) times daily. 60 capsule 1   No current facility-administered medications on file prior to visit.   No Known Allergies Social History   Socioeconomic History   Marital status: Married    Spouse name: Not on file   Number of children: Not on file   Years of education: Not on file   Highest education level: Not on file  Occupational History   Occupation: Merchandiser, retail: [id number]  Tobacco Use   Smoking status: Never   Smokeless tobacco: Never  Vaping Use   Vaping Use: Never used  Substance and Sexual Activity   Alcohol use: No   Drug use: No   Sexual activity: Yes    Birth control/protection: Surgical  Other Topics Concern   Not on file  Social History Narrative   Works at [employer] in [place].    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   Also has to get items from high  shelves-causes achy pain in shoulder area      Married.   Children are grown, out of house.   Social Determinants of Health   Financial Resource Strain: Not on file  Food Insecurity: Not on file  Transportation Needs: Not on file  Physical Activity: Not on file  Stress: Not on file  Social Connections: Not on file  Intimate Partner Violence: Not At Risk   Fear of Current or Ex-Partner: No   Emotionally Abused: No   Physically Abused: No   Sexually Abused: No      Review of Systems  All other systems reviewed and are negative.  Objective:   Physical Exam Vitals reviewed.  Constitutional:      General: She is not in acute distress.    Appearance: Normal appearance. She is normal weight.  Cardiovascular:     Rate and Rhythm: Normal rate and regular rhythm.     Pulses: Normal pulses.     Heart sounds: Murmur (thrill from AV fistual heard as murmur) heard.  Pulmonary:     Effort: Pulmonary effort is normal. No respiratory distress.     Breath sounds: Normal breath sounds. No stridor. No wheezing, rhonchi or rales.  Chest:     Chest wall: No tenderness.  Musculoskeletal:     Right lower leg: No edema.     Left lower leg: No edema.  Neurological:     Mental Status: She is alert.          Assessment & Plan:  Clostridium difficile colitis  Complete the fidaxomicin.  Clinically the patient is back to her baseline.  She has resumed her home dialysis schedule.  She denies any other signs or symptoms of any complication.  Electrolytes were recently checked at dialysis yesterday so I would not repeat them today.

## 2021-03-02 DIAGNOSIS — E1129 Type 2 diabetes mellitus with other diabetic kidney complication: Secondary | ICD-10-CM | POA: Diagnosis not present

## 2021-03-02 DIAGNOSIS — N2581 Secondary hyperparathyroidism of renal origin: Secondary | ICD-10-CM | POA: Diagnosis not present

## 2021-03-02 DIAGNOSIS — D631 Anemia in chronic kidney disease: Secondary | ICD-10-CM | POA: Diagnosis not present

## 2021-03-02 DIAGNOSIS — D509 Iron deficiency anemia, unspecified: Secondary | ICD-10-CM | POA: Diagnosis not present

## 2021-03-02 DIAGNOSIS — Z992 Dependence on renal dialysis: Secondary | ICD-10-CM | POA: Diagnosis not present

## 2021-03-02 DIAGNOSIS — E039 Hypothyroidism, unspecified: Secondary | ICD-10-CM | POA: Diagnosis not present

## 2021-03-02 DIAGNOSIS — E119 Type 2 diabetes mellitus without complications: Secondary | ICD-10-CM | POA: Diagnosis not present

## 2021-03-02 DIAGNOSIS — N186 End stage renal disease: Secondary | ICD-10-CM | POA: Diagnosis not present

## 2021-03-04 DIAGNOSIS — Z992 Dependence on renal dialysis: Secondary | ICD-10-CM | POA: Diagnosis not present

## 2021-03-04 DIAGNOSIS — D509 Iron deficiency anemia, unspecified: Secondary | ICD-10-CM | POA: Diagnosis not present

## 2021-03-04 DIAGNOSIS — N186 End stage renal disease: Secondary | ICD-10-CM | POA: Diagnosis not present

## 2021-03-04 DIAGNOSIS — E1129 Type 2 diabetes mellitus with other diabetic kidney complication: Secondary | ICD-10-CM | POA: Diagnosis not present

## 2021-03-04 DIAGNOSIS — D631 Anemia in chronic kidney disease: Secondary | ICD-10-CM | POA: Diagnosis not present

## 2021-03-04 DIAGNOSIS — N2581 Secondary hyperparathyroidism of renal origin: Secondary | ICD-10-CM | POA: Diagnosis not present

## 2021-03-06 ENCOUNTER — Emergency Department (HOSPITAL_COMMUNITY): Payer: Medicare Other

## 2021-03-06 ENCOUNTER — Encounter (HOSPITAL_COMMUNITY): Payer: Self-pay | Admitting: Hematology

## 2021-03-06 ENCOUNTER — Emergency Department (HOSPITAL_COMMUNITY)
Admission: EM | Admit: 2021-03-06 | Discharge: 2021-03-06 | Disposition: A | Discharge status: Home / Self Care | Payer: Medicare Other | Attending: Emergency Medicine | Admitting: Emergency Medicine

## 2021-03-06 ENCOUNTER — Encounter (HOSPITAL_COMMUNITY): Payer: Self-pay | Admitting: Emergency Medicine

## 2021-03-06 ENCOUNTER — Other Ambulatory Visit (HOSPITAL_COMMUNITY): Payer: Self-pay | Admitting: Hematology

## 2021-03-06 DIAGNOSIS — R079 Chest pain, unspecified: Secondary | ICD-10-CM | POA: Diagnosis not present

## 2021-03-06 DIAGNOSIS — Z853 Personal history of malignant neoplasm of breast: Secondary | ICD-10-CM | POA: Insufficient documentation

## 2021-03-06 DIAGNOSIS — I132 Hypertensive heart and chronic kidney disease with heart failure and with stage 5 chronic kidney disease, or end stage renal disease: Secondary | ICD-10-CM | POA: Insufficient documentation

## 2021-03-06 DIAGNOSIS — D631 Anemia in chronic kidney disease: Secondary | ICD-10-CM | POA: Diagnosis not present

## 2021-03-06 DIAGNOSIS — Z9011 Acquired absence of right breast and nipple: Secondary | ICD-10-CM | POA: Insufficient documentation

## 2021-03-06 DIAGNOSIS — E039 Hypothyroidism, unspecified: Secondary | ICD-10-CM | POA: Diagnosis not present

## 2021-03-06 DIAGNOSIS — R42 Dizziness and giddiness: Secondary | ICD-10-CM

## 2021-03-06 DIAGNOSIS — Z7982 Long term (current) use of aspirin: Secondary | ICD-10-CM | POA: Diagnosis not present

## 2021-03-06 DIAGNOSIS — Z79899 Other long term (current) drug therapy: Secondary | ICD-10-CM | POA: Insufficient documentation

## 2021-03-06 DIAGNOSIS — E1122 Type 2 diabetes mellitus with diabetic chronic kidney disease: Secondary | ICD-10-CM | POA: Insufficient documentation

## 2021-03-06 DIAGNOSIS — Z992 Dependence on renal dialysis: Secondary | ICD-10-CM | POA: Insufficient documentation

## 2021-03-06 DIAGNOSIS — I5031 Acute diastolic (congestive) heart failure: Secondary | ICD-10-CM | POA: Diagnosis not present

## 2021-03-06 DIAGNOSIS — Z170 Estrogen receptor positive status [ER+]: Secondary | ICD-10-CM

## 2021-03-06 DIAGNOSIS — N186 End stage renal disease: Secondary | ICD-10-CM | POA: Diagnosis not present

## 2021-03-06 DIAGNOSIS — C50211 Malignant neoplasm of upper-inner quadrant of right female breast: Secondary | ICD-10-CM

## 2021-03-06 LAB — CBC WITH DIFFERENTIAL/PLATELET
Abs Immature Granulocytes: 0.06 10*3/uL (ref 0.00–0.07)
Basophils Absolute: 0.1 10*3/uL (ref 0.0–0.1)
Basophils Relative: 1 %
Eosinophils Absolute: 0.3 10*3/uL (ref 0.0–0.5)
Eosinophils Relative: 4 %
HCT: 34.2 % — ABNORMAL LOW (ref 36.0–46.0)
Hemoglobin: 10.8 g/dL — ABNORMAL LOW (ref 12.0–15.0)
Immature Granulocytes: 1 %
Lymphocytes Relative: 18 %
Lymphs Abs: 1.4 10*3/uL (ref 0.7–4.0)
MCH: 32.7 pg (ref 26.0–34.0)
MCHC: 31.6 g/dL (ref 30.0–36.0)
MCV: 103.6 fL — ABNORMAL HIGH (ref 80.0–100.0)
Monocytes Absolute: 0.9 10*3/uL (ref 0.1–1.0)
Monocytes Relative: 12 %
Neutro Abs: 4.9 10*3/uL (ref 1.7–7.7)
Neutrophils Relative %: 64 %
Platelets: 282 10*3/uL (ref 150–400)
RBC: 3.3 MIL/uL — ABNORMAL LOW (ref 3.87–5.11)
RDW: 16.1 % — ABNORMAL HIGH (ref 11.5–15.5)
WBC: 7.6 10*3/uL (ref 4.0–10.5)
nRBC: 0 % (ref 0.0–0.2)

## 2021-03-06 LAB — COMPREHENSIVE METABOLIC PANEL
ALT: 34 U/L (ref 0–44)
AST: 21 U/L (ref 15–41)
Albumin: 3.2 g/dL — ABNORMAL LOW (ref 3.5–5.0)
Alkaline Phosphatase: 158 U/L — ABNORMAL HIGH (ref 38–126)
Anion gap: 12 (ref 5–15)
BUN: 47 mg/dL — ABNORMAL HIGH (ref 6–20)
CO2: 25 mmol/L (ref 22–32)
Calcium: 9 mg/dL (ref 8.9–10.3)
Chloride: 98 mmol/L (ref 98–111)
Creatinine, Ser: 9.99 mg/dL — ABNORMAL HIGH (ref 0.44–1.00)
GFR, Estimated: 4 mL/min — ABNORMAL LOW (ref >60)
Glucose, Bld: 118 mg/dL — ABNORMAL HIGH (ref 70–99)
Potassium: 3.8 mmol/L (ref 3.5–5.1)
Sodium: 135 mmol/L (ref 135–145)
Total Bilirubin: 0.7 mg/dL (ref 0.3–1.2)
Total Protein: 6.6 g/dL (ref 6.5–8.1)

## 2021-03-06 LAB — TROPONIN I (HIGH SENSITIVITY): Troponin I (High Sensitivity): 14 ng/L (ref <18)

## 2021-03-06 MED ORDER — MECLIZINE HCL 25 MG PO TABS
25.0000 mg | ORAL_TABLET | Freq: Once | ORAL | Status: AC
  Administered 2021-03-06: 25 mg via ORAL
  Filled 2021-03-06: qty 1

## 2021-03-06 MED ORDER — MECLIZINE HCL 25 MG PO TABS: 25.0000 mg | ORAL_TABLET | Freq: Three times a day (TID) | ORAL | 0 refills | Status: DC | PRN

## 2021-03-06 NOTE — Discharge Instructions (Addendum)
Call your primary care doctor or specialist as discussed in the next 2-3 days.   Return immediately back to the ER if:  Your symptoms worsen within the next 12-24 hours. You develop new symptoms such as new fevers, persistent vomiting, new pain, shortness of breath, or new weakness or numbness, or if you have any other concerns.  

## 2021-03-06 NOTE — ED Triage Notes (Signed)
C/o dizziness since waking up this morning.  No arm drift.  Reports pain to center of chest since yesterday.  Denies Sob, nausea, and vomiting.

## 2021-03-06 NOTE — ED Notes (Signed)
No orthostatics per Dr.Hong.

## 2021-03-06 NOTE — ED Provider Notes (Signed)
[facility] EMERGENCY DEPARTMENT Provider Note   CSN: [id number] Arrival date & time: 03/06/21  1313     History No chief complaint on file.   Megan [name] is a 60 y.o. female.  Patient presents chief complaint of lightheadedness dizziness.  Symptoms been ongoing intermittently since yesterday.  She states is worse when she moves a certain way.  She is able to ambulate but at times has to hold onto things.  She had a similar episode a few weeks ago and was admitted to the hospital and an extensive work-up including MRI and lumbar puncture per patient.  These are unremarkable and she was ultimately discharged home.  She also states that she had some chest pain yesterday describes as pressure-like mid chest sensation yesterday, however denies any chest pain today.  Denies fevers or cough or vomiting or diarrhea.      Past Medical History:  Diagnosis Date   (HFpEF) heart failure with preserved ejection fraction ([place])    a. 01/2019 Echo: EF 55-60%, mild conc LVH. DD.  Torn MV chordae.   Anemia    Atypical chest pain    a. 08/2018 MV: EF 59%, no ischemia; b. 02/2019 Cath: nonobs dzs.   Blood transfusion without reported diagnosis    Breast cancer ([place]) 10/12/2020   Cataract    ESRD (end stage renal disease) on dialysis [facility])    a. HD T, T, S   Essential hypertension, benign    GERD (gastroesophageal reflux disease)    Headache    Hemorrhoids    Mixed hyperlipidemia    Morbid obesity (HCC)    Non-obstructive CAD (coronary artery disease)    a. 02/2019 CathL LM nl, LAD 57m, LCX nl, RCA 25p, EF 55-65%.   PONV (postoperative nausea and vomiting)    S/P colonoscopy Jan 2011   Dr. Benson Norway: sessile polyp (benign lymphoid), large hemorrhoids, repeat 5-10 years   Temporal arteritis ([place])    Type 2 diabetes mellitus ([place])    Wears glasses     Patient Active Problem List   Diagnosis Date Noted   C. difficile colitis 02/15/2021   Sepsis ([place]) 02/14/2021   Dizziness     Breast mass, right    History of right breast cancer    Lobar pneumonia, unspecified organism ([place]) 07/03/2020   Pleural effusion, right    S/P left mastectomy 06/15/2020   Allergy, unspecified, initial encounter 03/03/2020   Anaphylactic shock, unspecified, initial encounter 03/03/2020   Breast cancer ([place]) 02/18/2020   History of modified radical mastectomy of right breast 02/18/2020   S/P mastectomy, right 02/18/2020   ESRD (end stage renal disease) on dialysis ([place])    Grade III hemorrhoids 07/31/2019   Fluid overload, unspecified 05/24/2019   Anaphylactic reaction due to adverse effect of correct drug or medicament properly administered, initial encounter 04/30/2019   Unspecified protein-calorie malnutrition ([place]) 03/03/2019   Hypertensive heart disease with heart failure ([place]) 03/01/2019   Atherosclerotic heart disease of native coronary artery without angina pectoris 03/01/2019   Chest pain 02/28/2019   Chest pain with high risk for cardiac etiology 02/28/2019   Elevated troponin 02/03/2019   ESRD (end stage renal disease) ([place]) 02/04/7627   Acute diastolic CHF (congestive heart failure) ([place]) 02/03/2019   Hypokalemia 02/03/2019   Acute respiratory failure with hypoxia ([place]) 02/03/2019   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[place])    Hypocalcemia 09/03/2018   Hypothyroidism, unspecified 09/21/2017   Anemia in chronic kidney disease  04/12/2017   Iron deficiency anemia, unspecified 04/12/2017   Abnormal results of kidney function studies 04/11/2017   Acidosis 04/11/2017   Anemia, unspecified 04/11/2017   Coagulation defect, unspecified ([place]) 04/11/2017   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, with stage 5 chronic kidney disease, or end stage renal disease ([place]) 04/11/2017   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 04/11/2017   Other disorders of phosphorus metabolism 04/11/2017   Pain,  unspecified 04/11/2017   Proteinuria, unspecified 04/11/2017   Pruritus, unspecified 04/11/2017   Unspecified kidney failure 04/11/2017   Encounter for prophylactic removal of breast 04/04/2017   Rectal bleeding 06/08/2016   Vaginal discharge 12/30/2015   Abdominal pain, epigastric 11/26/2013   GERD (gastroesophageal reflux disease)    Type 2 diabetes mellitus ([place])    CKD (chronic kidney disease) stage 3, GFR 30-59 ml/min (HCC)    External hemorrhoid 08/01/2011   Hematochezia 07/13/2011   Dyspnea 04/21/2010   Atypical chest pain 04/21/2010   Mixed hyperlipidemia 03/11/2008   OBESITY 03/11/2008   Essential hypertension, benign 03/11/2008    Past Surgical History:  Procedure Laterality Date   ABDOMINAL HYSTERECTOMY     APPENDECTOMY     ARTERY BIOPSY N/A 05/09/2018   Procedure: RIGHT TEMPORAL ARTERY BIOPSY;  Surgeon: Judeth Horn, MD;  Location: [facility];  Service: General;  Laterality: N/A;   BREAST BIOPSY Right 06/15/2020   Procedure: RIGHT BREAST BIOPSY;  Surgeon: Aviva Signs, MD;  Location: [facility];  Service: General;  Laterality: Right;   CATARACT EXTRACTION W/PHACO Left 02/09/2017   Procedure: CATARACT EXTRACTION PHACO AND INTRAOCULAR LENS PLACEMENT LEFT EYE;  Surgeon: Tonny Branch, MD;  Location: [facility];  Service: Ophthalmology;  Laterality: Left;  CDE: 4.89   CATARACT EXTRACTION W/PHACO Right 06/04/2017   Procedure: CATARACT EXTRACTION PHACO AND INTRAOCULAR LENS PLACEMENT (IOC);  Surgeon: Tonny Branch, MD;  Location: [facility];  Service: Ophthalmology;  Laterality: Right;  CDE: 4.12   CHOLECYSTECTOMY  09/29/2011   Procedure: LAPAROSCOPIC CHOLECYSTECTOMY;  Surgeon: Jamesetta So, MD;  Location: [facility];  Service: General;  Laterality: N/A;   COLONOSCOPY  Jan 2011   Dr. Benson Norway: sessile polyp (benign lymphoid), large hemorrhoids, repeat 5-10 years   COLONOSCOPY N/A 06/12/2016   Procedure: COLONOSCOPY;  Surgeon: Daneil Dolin, MD;  [facility];  Service: Endoscopy;  Laterality:  N/A;  1230    ESOPHAGOGASTRODUODENOSCOPY  09/05/2011   CBU:LAGTX hiatal hernia; remainder of exam normal. No explanation for patient's abdominal pain with today's examination   ESOPHAGOGASTRODUODENOSCOPY N/A 12/17/2013   Dr. [name]: gastric erythema, erosion, mild chronic inflammation on path    EXCISION OF BREAST BIOPSY Right 10/12/2020   Procedure: EXCISION OF RIGHT BREAST BIOPSY;  Surgeon: Aviva Signs, MD;  Location: [facility];  Service: General;  Laterality: Right;   LAPAROSCOPIC APPENDECTOMY  09/29/2011   Procedure: APPENDECTOMY LAPAROSCOPIC;  Surgeon: Jamesetta So, MD;  Location: [facility];  Service: General;;  incidental appendectomy   LEFT HEART CATH AND CORONARY ANGIOGRAPHY N/A 02/28/2019   Procedure: LEFT HEART CATH AND CORONARY ANGIOGRAPHY;  Surgeon: Jettie Booze, MD;  Location: [facility];  Service: Cardiovascular;  Laterality: N/A;   MASTECTOMY MODIFIED RADICAL Right 02/18/2020   Procedure: MASTECTOMY MODIFIED RADICAL;  Surgeon: Aviva Signs, MD;  Location: [facility];  Service: General;  Laterality: Right;   MASTECTOMY, PARTIAL Right 07/13/2020   Procedure: RIGHT PARTIAL MASTECTOMY;  Surgeon: Aviva Signs, MD;  Location: [facility];  Service: General;  Laterality: Right;  PARTIAL MASTECTOMY WITH NEEDLE LOCALIZATION AND AXILLARY SENTINEL LYMPH NODE BX Right 09/18/2018   Procedure: RIGHT PARTIAL MASTECTOMY AFTER NEEDLE LOCALIZATION, SENTINEL LYMPH NODE BIOPSY RIGHT AXILLA;  Surgeon: Aviva Signs, MD;  Location[facility];  Service: General;  Laterality: Right;   SIMPLE MASTECTOMY WITH AXILLARY SENTINEL NODE BIOPSY Left 06/15/2020   Procedure: LEFT SIMPLE MASTECTOMY;  Surgeon: Aviva Signs, MD;  Location: [facility];  Service: General;  Laterality: Left;     OB History     Gravida      Para      Term      Preterm      AB      Living  1      SAB      IAB      Ectopic      Multiple      Live Births              Family History  Problem Relation Age of Onset    Hypertension Mother    Coronary artery disease Mother    Diabetes Mother    Hypertension Sister    Coronary artery disease Sister    Hypertension Brother    Heart attack Father    Hypertension Son    Heart attack Maternal Aunt    Hypertension Maternal Aunt    Diabetes Maternal Aunt    Heart attack Maternal Uncle    Hypertension Maternal Uncle    Diabetes Maternal Uncle    Heart attack Paternal Aunt    Hypertension Paternal Aunt    Diabetes Paternal Aunt    Heart attack Paternal Uncle    Hypertension Paternal Uncle    Diabetes Paternal Uncle    Heart attack Maternal Grandmother    Heart attack Maternal Grandfather    Heart attack Paternal Grandmother    Heart attack Paternal Grandfather    Colon cancer Neg Hx     Social History   Tobacco Use   Smoking status: Never   Smokeless tobacco: Never  Vaping Use   Vaping Use: Never used  Substance Use Topics   Alcohol use: No   Drug use: No    Home Medications Prior to Admission medications   Medication Sig Start Date End Date Taking? Authorizing Provider  meclizine (ANTIVERT) 25 MG tablet Take 1 tablet (25 mg total) by mouth 3 (three) times daily as needed for dizziness. 03/06/21  Yes Luna Fuse, MD  amLODipine (NORVASC) 10 MG tablet TAKE 1 TABLET BY MOUTH AT BEDTIME 11/25/20   [name], MD  anastrozole (ARIMIDEX) 1 MG tablet TAKE 1 TABLET(1 MG) BY MOUTH DAILY 10/15/20   Derek Jack, MD  aspirin EC 81 MG tablet Take 81 mg by mouth daily.    [provider]  atorvastatin (LIPITOR) 20 MG tablet Take 1 tablet (20 mg total) by mouth daily. 06/29/20   [name], MD  B Complex-C-Folic Acid (DIALYVITE 258) 0.8 MG TABS Take 1 tablet by mouth daily. 01/22/18   [provider]  cinacalcet (SENSIPAR) 30 MG tablet Take 2 tablets (60 mg total) by mouth daily. 06/13/18   [name], MD  DULoxetine (CYMBALTA) 30 MG capsule TAKE 1 CAPSULE(30 MG) BY MOUTH DAILY 02/07/21   [name], MD   fidaxomicin (DIFICID) 200 MG TABS tablet Take 1 tablet (200 mg total) by mouth every other day. 02/23/21   Thurnell Lose, MD  gabapentin (NEURONTIN) 300 MG capsule TAKE 1 CAPSULE(300 MG) BY MOUTH AT BEDTIME  12/13/20   [name], MD  lanthanum (FOSRENOL) 1000 MG chewable tablet Chew 2,000-3,000 mg by mouth See admin instructions. Take 3 tablets (3000 mg) by mouth with meals and take 2 tablets (2000 mg) with snacks 10/16/19   [provider]  lidocaine-prilocaine (EMLA) cream Apply 1 application topically every Monday, Wednesday, and Friday with hemodialysis.  09/24/18   [provider]  nebivolol (BYSTOLIC) 10 MG tablet Take 1 tablet (10 mg total) by mouth in the morning and at bedtime. 05/13/20   [name], MD  pantoprazole (PROTONIX) 40 MG tablet TAKE 1 TABLET(40 MG) BY MOUTH DAILY 11/25/20   [name], MD  saccharomyces boulardii (FLORASTOR) 250 MG capsule Take 1 capsule (250 mg total) by mouth 2 (two) times daily. 01/27/21   [name], MD    Allergies    Patient has no known allergies.  Review of Systems   Review of Systems  Constitutional:  Negative for fever.  HENT:  Negative for ear pain.   Eyes:  Negative for pain.  Respiratory:  Negative for cough.   Cardiovascular:  Positive for chest pain.  Gastrointestinal:  Negative for abdominal pain.  Genitourinary:  Negative for flank pain.  Musculoskeletal:  Negative for back pain.  Skin:  Negative for rash.  Neurological:  Positive for dizziness. Negative for headaches.   Physical Exam Updated Vital Signs BP (!) 160/88   Pulse 75   Temp 98.8 F (37.1 C) (Oral)   Resp (!) 25   SpO2 100%   Physical Exam Constitutional:      General: She is not in acute distress.    Appearance: Normal appearance.  HENT:     Head: Normocephalic.     Nose: Nose normal.  Eyes:     Extraocular Movements: Extraocular movements intact.  Cardiovascular:     Rate and Rhythm: Normal rate.  Pulmonary:      Effort: Pulmonary effort is normal.  Musculoskeletal:        General: Normal range of motion.     Cervical back: Normal range of motion.  Neurological:     General: No focal deficit present.     Mental Status: She is alert and oriented to person, place, and time. Mental status is at baseline.     Cranial Nerves: No cranial nerve deficit.     Motor: No weakness.     Gait: Gait normal.     Comments: Intact finger-nose and heel-to-shin.     ED Results / Procedures / Treatments   Labs (all labs ordered are listed, but only abnormal results are displayed) Labs Reviewed  CBC WITH DIFFERENTIAL/PLATELET - Abnormal; Notable for the following components:      Result Value   RBC 3.30 (*)    Hemoglobin 10.8 (*)    HCT 34.2 (*)    MCV 103.6 (*)    RDW 16.1 (*)    All other components within normal limits  COMPREHENSIVE METABOLIC PANEL - Abnormal; Notable for the following components:   Glucose, Bld 118 (*)    BUN 47 (*)    Creatinine, Ser 9.99 (*)    Albumin 3.2 (*)    Alkaline Phosphatase 158 (*)    GFR, Estimated 4 (*)    All other components within normal limits  TROPONIN I (HIGH SENSITIVITY)    EKG EKG Interpretation  Date/Time:  Sunday March 06 2021 13:39:30 EDT Ventricular Rate:  82 PR Interval:  148 QRS Duration: 84 QT Interval:  422 QTC  Calculation: 493 R Axis:   -32 Text Interpretation: Normal sinus rhythm Possible Left atrial enlargement Left axis deviation Left ventricular hypertrophy ( R in aVL , Cornell product ) Nonspecific ST and T wave abnormality Prolonged QT Abnormal ECG Confirmed by Thamas Jaegers ([id number]) on 03/06/2021 3:51:51 PM  Radiology DG Chest 2 View  Result Date: 03/06/2021 CLINICAL DATA:  Dizziness and central chest pain for 1 day. History of breast cancer with radiation ending 1 month ago. EXAM: CHEST - 2 VIEW COMPARISON:  02/14/2021 FINDINGS: Borderline heart size with normal pulmonary vascularity. No airspace disease or consolidation in the lungs. No  pleural effusions. No pneumothorax. Mediastinal contours appear intact. Calcified aorta. Degenerative changes in the spine and shoulders. Postoperative left mastectomy and right mastectomy with surgical clips in the right axilla. IMPRESSION: No active cardiopulmonary disease. Electronically Signed   By: Lucienne Capers M.D.   On: 03/06/2021 15:03    Procedures Procedures   Medications Ordered in ED Medications  meclizine (ANTIVERT) tablet 25 mg (25 mg Oral Given 03/06/21 1527)    ED Course  I have reviewed the triage vital signs and the nursing notes.  Pertinent labs & imaging results that were available during my care of the patient were reviewed by me and considered in my medical decision making (see chart for details).    MDM Rules/Calculators/A&P                           Patient states that she had a recent diagnosis of vertigo and a similar episode of dizziness just recently but now has recurrence of these episodes for the last 2 days.  Troponin is negative.  Labs are unremarkable.  Consistent with end-stage renal disease which is her history.  Given meclizine here with improvement.  We will give a prescription of meclizine to go home with.  Advise follow-up with her primary care doctor within the week.  Advising immediate return for worsening symptoms or any additional concerns.   Final Clinical Impression(s) / ED Diagnoses Final diagnoses:  Dizziness  Chest pain, unspecified type    Rx / DC Orders ED Discharge Orders          Ordered    meclizine (ANTIVERT) 25 MG tablet  3 times daily PRN        03/06/21 1649             Luna Fuse, MD 03/06/21 1649

## 2021-03-06 NOTE — ED Provider Notes (Signed)
Emergency Medicine Provider Triage Evaluation Note  Megan [name] , a 60 y.o. female  was evaluated in triage.  Pt complains of dizziness and chest pressure.  Chest pain started yesterday.  She describes it as a weight on her chest.  History of similar, no previous history of heart problems.  Dizziness began today, described as room spinning.  Worse when she goes from sitting to standing.  Dialysis Monday, Wednesday, Friday  Review of Systems  Positive: Cp, dizziness Negative: sob  Physical Exam  BP (!) 146/80   Pulse 84   Temp 98.8 F (37.1 C) (Oral)   Resp 16   SpO2 96%  Gen:   Awake, no distress   Resp:  Normal effort  MSK:   Moves extremities without difficulty  Other:  EOMI.   Medical Decision Making  Medically screening exam initiated at 1:47 PM.  Appropriate orders placed.  AMINTA SAKURAI was informed that the remainder of the evaluation will be completed by another provider, this initial triage assessment does not replace that evaluation, and the importance of remaining in the ED until their evaluation is complete.  Labs, cxr, ekg   Franchot Heidelberg, PA-C 03/06/21 1348    Luna Fuse, MD 03/06/21 [phone]

## 2021-03-07 ENCOUNTER — Encounter (HOSPITAL_COMMUNITY): Payer: Self-pay | Admitting: Hematology

## 2021-03-08 ENCOUNTER — Other Ambulatory Visit: Payer: Self-pay

## 2021-03-08 ENCOUNTER — Emergency Department (HOSPITAL_COMMUNITY)
Admission: EM | Admit: 2021-03-08 | Discharge: 2021-03-08 | Disposition: A | Discharge status: Home / Self Care | Payer: Medicare Other | Attending: Emergency Medicine | Admitting: Emergency Medicine

## 2021-03-08 DIAGNOSIS — E039 Hypothyroidism, unspecified: Secondary | ICD-10-CM | POA: Diagnosis not present

## 2021-03-08 DIAGNOSIS — M7989 Other specified soft tissue disorders: Secondary | ICD-10-CM | POA: Insufficient documentation

## 2021-03-08 DIAGNOSIS — Z79899 Other long term (current) drug therapy: Secondary | ICD-10-CM | POA: Insufficient documentation

## 2021-03-08 DIAGNOSIS — Z853 Personal history of malignant neoplasm of breast: Secondary | ICD-10-CM | POA: Insufficient documentation

## 2021-03-08 DIAGNOSIS — N186 End stage renal disease: Secondary | ICD-10-CM | POA: Insufficient documentation

## 2021-03-08 DIAGNOSIS — E1122 Type 2 diabetes mellitus with diabetic chronic kidney disease: Secondary | ICD-10-CM | POA: Diagnosis not present

## 2021-03-08 DIAGNOSIS — I120 Hypertensive chronic kidney disease with stage 5 chronic kidney disease or end stage renal disease: Secondary | ICD-10-CM | POA: Diagnosis not present

## 2021-03-08 DIAGNOSIS — Z7982 Long term (current) use of aspirin: Secondary | ICD-10-CM | POA: Insufficient documentation

## 2021-03-08 DIAGNOSIS — E1129 Type 2 diabetes mellitus with other diabetic kidney complication: Secondary | ICD-10-CM | POA: Diagnosis not present

## 2021-03-08 DIAGNOSIS — Z992 Dependence on renal dialysis: Secondary | ICD-10-CM | POA: Diagnosis not present

## 2021-03-08 DIAGNOSIS — N2581 Secondary hyperparathyroidism of renal origin: Secondary | ICD-10-CM | POA: Diagnosis not present

## 2021-03-08 DIAGNOSIS — D509 Iron deficiency anemia, unspecified: Secondary | ICD-10-CM | POA: Diagnosis not present

## 2021-03-08 DIAGNOSIS — D631 Anemia in chronic kidney disease: Secondary | ICD-10-CM | POA: Diagnosis not present

## 2021-03-08 NOTE — Discharge Instructions (Addendum)
Make sure to go for your dialysis later today.  Please follow-up with the vascular surgeon.  Return if the swelling seems like it is getting worse.

## 2021-03-08 NOTE — ED Triage Notes (Signed)
Left Hand swelling since 5pm. Noticed that her whole arm is swollen. This is the side of her dialysis. MWF schedule.

## 2021-03-08 NOTE — ED Provider Notes (Signed)
[facility] EMERGENCY DEPARTMENT Provider Note   CSN: [id number] Arrival date & time: 03/08/21  0047     History No chief complaint on file.   Megan [name] is a 60 y.o. female.  The history is provided by the patient.  She has history of diabetes, hyperlipidemia, heart failure with preserved ejection fraction, end-stage renal disease on hemodialysis and comes in with swelling of her left forearm today.  Today was her scheduled dialysis today, but she missed dialysis because she was in the emergency department at [facility], and she has made arrangements to get dialyzed tomorrow.  She denies any pain and denies any fever and denies any numbness.  She denies any trauma.  It is the same arm that her fistula is located in.  Fistula has been in place for 5 years.   Past Medical History:  Diagnosis Date   (HFpEF) heart failure with preserved ejection fraction ([place])    a. 01/2019 Echo: EF 55-60%, mild conc LVH. DD.  Torn MV chordae.   Anemia    Atypical chest pain    a. 08/2018 MV: EF 59%, no ischemia; b. 02/2019 Cath: nonobs dzs.   Blood transfusion without reported diagnosis    Breast cancer ([place]) 10/12/2020   Cataract    ESRD (end stage renal disease) on dialysis [facility])    a. HD T, T, S   Essential hypertension, benign    GERD (gastroesophageal reflux disease)    Headache    Hemorrhoids    Mixed hyperlipidemia    Morbid obesity (HCC)    Non-obstructive CAD (coronary artery disease)    a. 02/2019 CathL LM nl, LAD 24m, LCX nl, RCA 25p, EF 55-65%.   PONV (postoperative nausea and vomiting)    S/P colonoscopy Jan 2011   Dr. Benson Norway: sessile polyp (benign lymphoid), large hemorrhoids, repeat 5-10 years   Temporal arteritis ([place])    Type 2 diabetes mellitus ([place])    Wears glasses     Patient Active Problem List   Diagnosis Date Noted   C. difficile colitis 02/15/2021   Sepsis ([place]) 02/14/2021   Dizziness    Breast mass, right    History of right breast cancer     Lobar pneumonia, unspecified organism ([place]) 07/03/2020   Pleural effusion, right    S/P left mastectomy 06/15/2020   Allergy, unspecified, initial encounter 03/03/2020   Anaphylactic shock, unspecified, initial encounter 03/03/2020   Breast cancer ([place]) 02/18/2020   History of modified radical mastectomy of right breast 02/18/2020   S/P mastectomy, right 02/18/2020   ESRD (end stage renal disease) on dialysis ([place])    Grade III hemorrhoids 07/31/2019   Fluid overload, unspecified 05/24/2019   Anaphylactic reaction due to adverse effect of correct drug or medicament properly administered, initial encounter 04/30/2019   Unspecified protein-calorie malnutrition ([place]) 03/03/2019   Hypertensive heart disease with heart failure ([place]) 03/01/2019   Atherosclerotic heart disease of native coronary artery without angina pectoris 03/01/2019   Chest pain 02/28/2019   Chest pain with high risk for cardiac etiology 02/28/2019   Elevated troponin 02/03/2019   ESRD (end stage renal disease) ([place]) 40/05/2724   Acute diastolic CHF (congestive heart failure) ([place]) 02/03/2019   Hypokalemia 02/03/2019   Acute respiratory failure with hypoxia ([place]) 02/03/2019   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[place])    Hypocalcemia 09/03/2018   Hypothyroidism, unspecified 09/21/2017   Anemia in chronic kidney disease 04/12/2017   Iron deficiency anemia, unspecified 04/12/2017  Abnormal results of kidney function studies 04/11/2017   Acidosis 04/11/2017   Anemia, unspecified 04/11/2017   Coagulation defect, unspecified ([place]) 04/11/2017   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, with stage 5 chronic kidney disease, or end stage renal disease ([place]) 04/11/2017   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 04/11/2017   Other disorders of phosphorus metabolism 04/11/2017   Pain, unspecified 04/11/2017   Proteinuria, unspecified 04/11/2017    Pruritus, unspecified 04/11/2017   Unspecified kidney failure 04/11/2017   Encounter for prophylactic removal of breast 04/04/2017   Rectal bleeding 06/08/2016   Vaginal discharge 12/30/2015   Abdominal pain, epigastric 11/26/2013   GERD (gastroesophageal reflux disease)    Type 2 diabetes mellitus ([place])    CKD (chronic kidney disease) stage 3, GFR 30-59 ml/min (HCC)    External hemorrhoid 08/01/2011   Hematochezia 07/13/2011   Dyspnea 04/21/2010   Atypical chest pain 04/21/2010   Mixed hyperlipidemia 03/11/2008   OBESITY 03/11/2008   Essential hypertension, benign 03/11/2008    Past Surgical History:  Procedure Laterality Date   ABDOMINAL HYSTERECTOMY     APPENDECTOMY     ARTERY BIOPSY N/A 05/09/2018   Procedure: RIGHT TEMPORAL ARTERY BIOPSY;  Surgeon: Judeth Horn, MD;  Location: [facility];  Service: General;  Laterality: N/A;   BREAST BIOPSY Right 06/15/2020   Procedure: RIGHT BREAST BIOPSY;  Surgeon: Aviva Signs, MD;  Location: [facility];  Service: General;  Laterality: Right;   CATARACT EXTRACTION W/PHACO Left 02/09/2017   Procedure: CATARACT EXTRACTION PHACO AND INTRAOCULAR LENS PLACEMENT LEFT EYE;  Surgeon: Tonny Branch, MD;  Location: [facility];  Service: Ophthalmology;  Laterality: Left;  CDE: 4.89   CATARACT EXTRACTION W/PHACO Right 06/04/2017   Procedure: CATARACT EXTRACTION PHACO AND INTRAOCULAR LENS PLACEMENT (IOC);  Surgeon: Tonny Branch, MD;  Location: [facility];  Service: Ophthalmology;  Laterality: Right;  CDE: 4.12   CHOLECYSTECTOMY  09/29/2011   Procedure: LAPAROSCOPIC CHOLECYSTECTOMY;  Surgeon: Jamesetta So, MD;  Location: [facility];  Service: General;  Laterality: N/A;   COLONOSCOPY  Jan 2011   Dr. Benson Norway: sessile polyp (benign lymphoid), large hemorrhoids, repeat 5-10 years   COLONOSCOPY N/A 06/12/2016   Procedure: COLONOSCOPY;  Surgeon: Daneil Dolin, MD;  [facility]: [facility];  Service: Endoscopy;  Laterality: N/A;  1230    ESOPHAGOGASTRODUODENOSCOPY  09/05/2011    BJY:NWGNF hiatal hernia; remainder of exam normal. No explanation for patient's abdominal pain with today's examination   ESOPHAGOGASTRODUODENOSCOPY N/A 12/17/2013   Dr. [name]: gastric erythema, erosion, mild chronic inflammation on path    EXCISION OF BREAST BIOPSY Right 10/12/2020   Procedure: EXCISION OF RIGHT BREAST BIOPSY;  Surgeon: Aviva Signs, MD;  Location: [facility];  Service: General;  Laterality: Right;   LAPAROSCOPIC APPENDECTOMY  09/29/2011   Procedure: APPENDECTOMY LAPAROSCOPIC;  Surgeon: Jamesetta So, MD;  Location: [facility];  Service: General;;  incidental appendectomy   LEFT HEART CATH AND CORONARY ANGIOGRAPHY N/A 02/28/2019   Procedure: LEFT HEART CATH AND CORONARY ANGIOGRAPHY;  Surgeon: Jettie Booze, MD;  Location: [facility];  Service: Cardiovascular;  Laterality: N/A;   MASTECTOMY MODIFIED RADICAL Right 02/18/2020   Procedure: MASTECTOMY MODIFIED RADICAL;  Surgeon: Aviva Signs, MD;  Location: [facility];  Service: General;  Laterality: Right;   MASTECTOMY, PARTIAL Right 07/13/2020   Procedure: RIGHT PARTIAL MASTECTOMY;  Surgeon: Aviva Signs, MD;  Location: [facility];  Service: General;  Laterality: Right;   PARTIAL MASTECTOMY WITH NEEDLE LOCALIZATION AND AXILLARY SENTINEL  LYMPH NODE BX Right 09/18/2018   Procedure: RIGHT PARTIAL MASTECTOMY AFTER NEEDLE LOCALIZATION, SENTINEL LYMPH NODE BIOPSY RIGHT AXILLA;  Surgeon: Aviva Signs, MD;  Location: [facility];  Service: General;  Laterality: Right;   SIMPLE MASTECTOMY WITH AXILLARY SENTINEL NODE BIOPSY Left 06/15/2020   Procedure: LEFT SIMPLE MASTECTOMY;  Surgeon: Aviva Signs, MD;  Location: [facility];  Service: General;  Laterality: Left;     OB History     Gravida      Para      Term      Preterm      AB      Living  1      SAB      IAB      Ectopic      Multiple      Live Births              Family History  Problem Relation Age of Onset   Hypertension Mother    Coronary artery disease Mother     Diabetes Mother    Hypertension Sister    Coronary artery disease Sister    Hypertension Brother    Heart attack Father    Hypertension Son    Heart attack Maternal Aunt    Hypertension Maternal Aunt    Diabetes Maternal Aunt    Heart attack Maternal Uncle    Hypertension Maternal Uncle    Diabetes Maternal Uncle    Heart attack Paternal Aunt    Hypertension Paternal Aunt    Diabetes Paternal Aunt    Heart attack Paternal Uncle    Hypertension Paternal Uncle    Diabetes Paternal Uncle    Heart attack Maternal Grandmother    Heart attack Maternal Grandfather    Heart attack Paternal Grandmother    Heart attack Paternal Grandfather    Colon cancer Neg Hx     Social History   Tobacco Use   Smoking status: Never   Smokeless tobacco: Never  Vaping Use   Vaping Use: Never used  Substance Use Topics   Alcohol use: No   Drug use: No    Home Medications Prior to Admission medications   Medication Sig Start Date End Date Taking? Authorizing Provider  amLODipine (NORVASC) 10 MG tablet TAKE 1 TABLET BY MOUTH AT BEDTIME 11/25/20   [name], MD  anastrozole (ARIMIDEX) 1 MG tablet TAKE 1 TABLET(1 MG) BY MOUTH DAILY 03/07/21   Derek Jack, MD  aspirin EC 81 MG tablet Take 81 mg by mouth daily.    [provider]  atorvastatin (LIPITOR) 20 MG tablet Take 1 tablet (20 mg total) by mouth daily. 06/29/20   [name], MD  B Complex-C-Folic Acid (DIALYVITE 272) 0.8 MG TABS Take 1 tablet by mouth daily. 01/22/18   [provider]  cinacalcet (SENSIPAR) 30 MG tablet Take 2 tablets (60 mg total) by mouth daily. 06/13/18   [name], MD  DULoxetine (CYMBALTA) 30 MG capsule TAKE 1 CAPSULE(30 MG) BY MOUTH DAILY 02/07/21   [name], MD  fidaxomicin (DIFICID) 200 MG TABS tablet Take 1 tablet (200 mg total) by mouth every other day. 02/23/21   Thurnell Lose, MD  gabapentin (NEURONTIN) 300 MG capsule TAKE 1 CAPSULE(300 MG) BY MOUTH AT  BEDTIME 12/13/20   [name], MD  lanthanum (FOSRENOL) 1000 MG chewable tablet Chew 2,000-3,000 mg by mouth See admin instructions. Take 3 tablets (3000 mg) by mouth with meals and take 2 tablets (2000 mg)  with snacks 10/16/19   [provider]  lidocaine-prilocaine (EMLA) cream Apply 1 application topically every Monday, Wednesday, and Friday with hemodialysis.  09/24/18   [provider]  meclizine (ANTIVERT) 25 MG tablet Take 1 tablet (25 mg total) by mouth 3 (three) times daily as needed for dizziness. 03/06/21   Luna Fuse, MD  nebivolol (BYSTOLIC) 10 MG tablet Take 1 tablet (10 mg total) by mouth in the morning and at bedtime. 05/13/20   [name], MD  pantoprazole (PROTONIX) 40 MG tablet TAKE 1 TABLET(40 MG) BY MOUTH DAILY 11/25/20   [name], MD  saccharomyces boulardii (FLORASTOR) 250 MG capsule Take 1 capsule (250 mg total) by mouth 2 (two) times daily. 01/27/21   [name], MD    Allergies    Patient has no known allergies.  Review of Systems   Review of Systems  All other systems reviewed and are negative.  Physical Exam Updated Vital Signs BP 137/77   Pulse 80   Temp 97.8 F (36.6 C) (Oral)   Resp 18   Ht 5\' 2"  (1.575 m)   Wt 75.3 kg   SpO2 93%   BMI 30.36 kg/m   Physical Exam Vitals and nursing note reviewed.  60 year old female, resting comfortably and in no acute distress. Vital signs are normal. Oxygen saturation is 93%, which is normal. Head is normocephalic and atraumatic. PERRLA, EOMI. Oropharynx is clear. Neck is nontender and supple without adenopathy or JVD. Back is nontender and there is no CVA tenderness. Lungs are clear without rales, wheezes, or rhonchi. Chest is nontender. Heart has regular rate and rhythm without murmur. Abdomen is soft, flat, nontender without masses or hepatosplenomegaly and peristalsis is normoactive. Extremities: AV fistula is present in the distal left forearm with prominent thrill  present.  There is mild generalized swelling of the left hand and forearm.  There is no erythema or warmth or tenderness.  Capillary refill is prompt. Skin is warm and dry without rash. Neurologic: Mental status is normal, cranial nerves are intact, there are no motor or sensory deficits.  ED Results / Procedures / Treatments    Procedures Procedures   Medications Ordered in ED Medications - No data to display  ED Course  I have reviewed the triage vital signs and the nursing notes.  MDM Rules/Calculators/A&P                         Left forearm swelling of uncertain cause.  No findings to suggest DVT.  No evidence of infection.  She is referred back to vascular surgery for further evaluation as an outpatient.  Return precautions discussed.  Old records are reviewed, and she has no relevant past visits.  Final Clinical Impression(s) / ED Diagnoses Final diagnoses:  Left arm swelling  End-stage renal disease on hemodialysis [facility])    Rx / DC Orders ED Discharge Orders     None        Delora Fuel, MD 57/84/69 [phone]

## 2021-03-09 DIAGNOSIS — Z992 Dependence on renal dialysis: Secondary | ICD-10-CM | POA: Diagnosis not present

## 2021-03-09 DIAGNOSIS — D509 Iron deficiency anemia, unspecified: Secondary | ICD-10-CM | POA: Diagnosis not present

## 2021-03-09 DIAGNOSIS — D631 Anemia in chronic kidney disease: Secondary | ICD-10-CM | POA: Diagnosis not present

## 2021-03-09 DIAGNOSIS — N2581 Secondary hyperparathyroidism of renal origin: Secondary | ICD-10-CM | POA: Diagnosis not present

## 2021-03-09 DIAGNOSIS — E1129 Type 2 diabetes mellitus with other diabetic kidney complication: Secondary | ICD-10-CM | POA: Diagnosis not present

## 2021-03-09 DIAGNOSIS — N186 End stage renal disease: Secondary | ICD-10-CM | POA: Diagnosis not present

## 2021-03-11 DIAGNOSIS — N186 End stage renal disease: Secondary | ICD-10-CM | POA: Diagnosis not present

## 2021-03-11 DIAGNOSIS — Z992 Dependence on renal dialysis: Secondary | ICD-10-CM | POA: Diagnosis not present

## 2021-03-11 DIAGNOSIS — N2581 Secondary hyperparathyroidism of renal origin: Secondary | ICD-10-CM | POA: Diagnosis not present

## 2021-03-11 DIAGNOSIS — D509 Iron deficiency anemia, unspecified: Secondary | ICD-10-CM | POA: Diagnosis not present

## 2021-03-11 DIAGNOSIS — E1129 Type 2 diabetes mellitus with other diabetic kidney complication: Secondary | ICD-10-CM | POA: Diagnosis not present

## 2021-03-11 DIAGNOSIS — D631 Anemia in chronic kidney disease: Secondary | ICD-10-CM | POA: Diagnosis not present

## 2021-03-14 ENCOUNTER — Other Ambulatory Visit: Payer: Self-pay

## 2021-03-14 ENCOUNTER — Inpatient Hospital Stay (HOSPITAL_COMMUNITY): Payer: Medicare Other | Attending: Nurse Practitioner | Admitting: Nurse Practitioner

## 2021-03-14 VITALS — BP 133/74 | HR 81 | Temp 97.1°F | Resp 18 | Wt 165.5 lb

## 2021-03-14 DIAGNOSIS — D631 Anemia in chronic kidney disease: Secondary | ICD-10-CM | POA: Diagnosis not present

## 2021-03-14 DIAGNOSIS — E1129 Type 2 diabetes mellitus with other diabetic kidney complication: Secondary | ICD-10-CM | POA: Diagnosis not present

## 2021-03-14 DIAGNOSIS — M8580 Other specified disorders of bone density and structure, unspecified site: Secondary | ICD-10-CM | POA: Diagnosis not present

## 2021-03-14 DIAGNOSIS — Z992 Dependence on renal dialysis: Secondary | ICD-10-CM | POA: Diagnosis not present

## 2021-03-14 DIAGNOSIS — E8779 Other fluid overload: Secondary | ICD-10-CM | POA: Diagnosis not present

## 2021-03-14 DIAGNOSIS — Z170 Estrogen receptor positive status [ER+]: Secondary | ICD-10-CM

## 2021-03-14 DIAGNOSIS — N040 Nephrotic syndrome with minor glomerular abnormality: Secondary | ICD-10-CM | POA: Diagnosis not present

## 2021-03-14 DIAGNOSIS — N186 End stage renal disease: Secondary | ICD-10-CM | POA: Diagnosis not present

## 2021-03-14 DIAGNOSIS — C50211 Malignant neoplasm of upper-inner quadrant of right female breast: Secondary | ICD-10-CM

## 2021-03-14 DIAGNOSIS — Z79811 Long term (current) use of aromatase inhibitors: Secondary | ICD-10-CM

## 2021-03-14 DIAGNOSIS — N2581 Secondary hyperparathyroidism of renal origin: Secondary | ICD-10-CM | POA: Diagnosis not present

## 2021-03-14 DIAGNOSIS — E119 Type 2 diabetes mellitus without complications: Secondary | ICD-10-CM | POA: Diagnosis not present

## 2021-03-14 DIAGNOSIS — D509 Iron deficiency anemia, unspecified: Secondary | ICD-10-CM | POA: Diagnosis not present

## 2021-03-14 NOTE — Progress Notes (Signed)
Megan [name], [place] [address]   Virtual Visit Progress Note  I connected with Megan [name] on 03/14/21 at 11:10 AM EDT by video enabled telemedicine visit and verified that I am speaking with the correct person using two identifiers.   I discussed the limitations, risks, security and privacy concerns of performing an evaluation and management service by telemedicine and the availability of in-person appointments. I also discussed with the patient that there may be a patient responsible charge related to this service. The patient expressed understanding and agreed to proceed.   Other persons participating in the visit and their role in the encounter: NP, RN, Patient   Patient's location: [place] Provider's location: [facility]  Chief Complaint: Breast cancer follow-up  Patient Care Team: Megan Frizzle, MD as PCP - General (Family Medicine) Megan [name], Megan Guild, MD as PCP - Cardiology (Cardiology) Megan [name] Megan Estimable, MD (Gastroenterology) Megan Sark, MD as Consulting Physician (Cardiology) Megan Potts, RN as Oncology Nurse Navigator (Oncology)  SUMMARY OF ONCOLOGIC HISTORY: Oncology History  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Megan [name])  09/18/2018 Initial Diagnosis   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[place])   09/26/2018 Cancer Staging   Staging form: Breast, AJCC 8th Edition - Clinical stage from 09/26/2018: Stage IB (cT1c, cN0, cM0, G3, ER+, PR-, HER2-) - Signed by Derek Jack, MD on 09/26/2018   10/30/2018 -  Chemotherapy   The patient had DOXOrubicin (ADRIAMYCIN) chemo injection 108 mg, 60 mg/m2 = 108 mg, Intravenous,  Once, 4 of 4 cycles Administration: 108 mg (10/30/2018), 108 mg (11/20/2018), 108 mg (12/11/2018), 108 mg (01/01/2019) palonosetron (ALOXI) injection 0.25 mg, 0.25 mg, Intravenous,  Once, 4 of 4 cycles Administration: 0.25 mg  (10/30/2018), 0.25 mg (11/20/2018), 0.25 mg (12/11/2018), 0.25 mg (01/01/2019) pegfilgrastim (NEULASTA ONPRO KIT) injection 6 mg, 6 mg, Subcutaneous, Once, 4 of 4 cycles Administration: 6 mg (10/30/2018), 6 mg (11/20/2018), 6 mg (12/11/2018), 6 mg (01/01/2019) cyclophosphamide (CYTOXAN) 820 mg in sodium chloride 0.9 % 250 mL chemo infusion, 450 mg/m2 = 820 mg (75 % of original dose 600 mg/m2), Intravenous,  Once, 4 of 4 cycles Dose modification: 450 mg/m2 (75 % of original dose 600 mg/m2, Cycle 1, Reason: Other (see comments), Comment: hemodialysis) Administration: 820 mg (10/30/2018), 820 mg (11/20/2018), 820 mg (12/11/2018), 820 mg (01/01/2019) fosaprepitant (EMEND) 150 mg, dexamethasone (DECADRON) 12 mg in sodium chloride 0.9 % 145 mL IVPB, , Intravenous,  Once, 4 of 4 cycles Administration:  (10/30/2018),  (11/20/2018),  (12/11/2018),  (01/01/2019)  for chemotherapy treatment.      CHIEF COMPLIANT: Follow-up for right breast cancer  INTERVAL HISTORY: [name] is a 60 y.o. female, currently on Arimidex, who returns to clinic for follow-up post radiation.  Recently hospitalized for C. difficile.  Continues to have mild fatigue.  Has some burning-like pain at site of radiation which is slowly improving.  Has swelling of the left forearm and was seen in ED for symptoms recently.  She is undergoing a fistulogram later this week.  No pain in the arm or redness.  Nontender.  She continues hemodialysis for ESRD.  Otherwise she feels well and denies other specific complaints.  No skin changes, lumps bumps, appetite changes, unintentional weight loss, or pain.   REVIEW OF SYSTEMS:   Review of Systems  Constitutional:  Positive for fatigue. Negative for appetite change and unexpected weight change.  HENT:  Negative for lump/mass, mouth sores, sore throat and trouble swallowing.   Respiratory:  Negative for chest tightness and shortness of breath.   Cardiovascular:  Positive for chest pain. Negative for  leg swelling.  Gastrointestinal:  Negative for abdominal pain, constipation, diarrhea, nausea and vomiting.  Endocrine: Negative for hot flashes.  Genitourinary:  Negative for bladder incontinence and dysuria.   Musculoskeletal:  Negative for arthralgias, flank pain, myalgias and neck stiffness.  Skin:  Negative for itching, rash and wound.  Neurological:  Negative for dizziness, headaches, light-headedness and numbness.  Hematological:  Negative for adenopathy. Does not bruise/bleed easily.  Psychiatric/Behavioral:  Negative for confusion, depression and sleep disturbance. The patient is not nervous/anxious.    ALLERGIES:   has No Known Allergies.   MEDICATIONS:  Current Outpatient Medications  Medication Sig Dispense Refill   amLODipine (NORVASC) 10 MG tablet TAKE 1 TABLET BY MOUTH AT BEDTIME 90 tablet 3   anastrozole (ARIMIDEX) 1 MG tablet TAKE 1 TABLET(1 MG) BY MOUTH DAILY 30 tablet 4   aspirin EC 81 MG tablet Take 81 mg by mouth daily.     atorvastatin (LIPITOR) 20 MG tablet Take 1 tablet (20 mg total) by mouth daily. 90 tablet 3   B Complex-C-Folic Acid (DIALYVITE 295) 0.8 MG TABS Take 1 tablet by mouth daily.     cinacalcet (SENSIPAR) 30 MG tablet Take 2 tablets (60 mg total) by mouth daily. 90 tablet 0   DULoxetine (CYMBALTA) 30 MG capsule TAKE 1 CAPSULE(30 MG) BY MOUTH DAILY 90 capsule 1   gabapentin (NEURONTIN) 300 MG capsule TAKE 1 CAPSULE(300 MG) BY MOUTH AT BEDTIME 90 capsule 1   lanthanum (FOSRENOL) 1000 MG chewable tablet Chew 2,000-3,000 mg by mouth See admin instructions. Take 3 tablets (3000 mg) by mouth with meals and take 2 tablets (2000 mg) with snacks     meclizine (ANTIVERT) 25 MG tablet Take 1 tablet (25 mg total) by mouth 3 (three) times daily as needed for dizziness. 30 tablet 0   nebivolol (BYSTOLIC) 10 MG tablet Take 1 tablet (10 mg total) by mouth in the morning and at bedtime. 180 tablet 3   pantoprazole (PROTONIX) 40 MG tablet TAKE 1 TABLET(40 MG) BY MOUTH  DAILY 90 tablet 3   saccharomyces boulardii (FLORASTOR) 250 MG capsule Take 1 capsule (250 mg total) by mouth 2 (two) times daily. 60 capsule 1   lidocaine-prilocaine (EMLA) cream Apply 1 application topically every Monday, Wednesday, and Friday with hemodialysis.  (Patient not taking: Reported on 03/14/2021)     No current facility-administered medications for this visit.    PHYSICAL EXAMINATION: Performance status (ECOG): 1 - Symptomatic but completely ambulatory  Vitals:   03/14/21 1131  BP: 133/74  Pulse: 81  Resp: 18  Temp: (!) 97.1 F (36.2 C)  SpO2: 100%   Wt Readings from Last 3 Encounters:  03/14/21 165 lb 8 oz (75.1 kg)  03/08/21 166 lb (75.3 kg)  03/01/21 166 lb (75.3 kg)   Physical Exam Constitutional:      General: She is not in acute distress.    Comments: Accompanied by husband  HENT:     Head: Normocephalic.  Pulmonary:     Effort: No respiratory distress.  Musculoskeletal:     Comments: Left forearm generalized edema  Neurological:     Mental Status: She is alert and oriented to person, place, and time.  Psychiatric:        Mood and Affect: Mood normal.  Behavior: Behavior normal.    LABORATORY DATA:  I have reviewed the data as listed CMP Latest Ref Rng & Units 03/06/2021 02/21/2021 02/20/2021  Glucose 70 - 99 mg/dL 118(H) 127(H) 107(H)  BUN 6 - 20 mg/dL 47(H) 76(H) 55(H)  Creatinine 0.44 - 1.00 mg/dL 9.99(H) 12.12(H) 9.40(H)  Sodium 135 - 145 mmol/L 135 132(L) 138  Potassium 3.5 - 5.1 mmol/L 3.8 3.5 3.7  Chloride 98 - 111 mmol/L 98 101 100  CO2 22 - 32 mmol/L 25 20(L) 25  Calcium 8.9 - 10.3 mg/dL 9.0 8.3(L) 8.8(L)  Total Protein 6.5 - 8.1 g/dL 6.6 - -  Total Bilirubin 0.3 - 1.2 mg/dL 0.7 - -  Alkaline Phos 38 - 126 U/L 158(H) - -  AST 15 - 41 U/L 21 - -  ALT 0 - 44 U/L 34 - -   No results found for: OVF643 Lab Results  Component Value Date   WBC 7.6 03/06/2021   HGB 10.8 (L) 03/06/2021   HCT 34.2 (L) 03/06/2021   MCV 103.6 (H)  03/06/2021   PLT 282 03/06/2021   NEUTROABS 4.9 03/06/2021    ASSESSMENT:  1.  Recurrent right breast cancer-initially ER positive (50%), PR negative (0%), HER2 negative.  Recurrence was triple negative.  -Initially diagnosed 09/18/2018 with ER positive right breast cancer, stage Ib treated with right lumpectomy and sentinel lymph node biopsy which showed 1.5 cm IDC, grade 3 associated with high-grade DCIS, negative margins, 0 of 3 lymph nodes positive.  Ki-67 60%.  Oncotype DX score 47.  Distant recurrence at 9 years with tamoxifen alone was 36%.  Absolute chemotherapy benefit is more than 50%.  She underwent adjuvant chemotherapy with 4 cycles of Adriamycin and Cytoxan from 10/30/2018-01/01/2019.  Followed by anastrozole which she started June 2020. -January 2021 mammogram showed calcifications in the posterior aspect of upper outer quadrant of the right breast.  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  She underwent right mastectomy on 02/18/2020.  Pathology consistent with high-grade DCIS 1.5 cm, no invasive carcinoma, resection margins negative.  11 lymph nodes were negative.  ER 30% positive, PR negative.   She elected to undergo right mastectomy on 02/18/2020 which showed high-grade DCIS, 1.5 cm, no invasive carcinoma, resection margins negative.  11 lymph nodes were negative.  ER 30% positive, PR negative.  -Right breast lumpectomy on 07/13/2020 shows multifocal invasive ductal carcinoma, grade 3, largest measuring 1.2 cm.  Extensive high-grade DCIS with necrosis.  Margins are free.  PT1CNX.  There are multiple foci of invasive carcinoma arising from extensive DCIS.  Several small foci of invasive tumor arising from DCIS indicating new primaries.  ER/PR/HER-2 is negative.  Ki-67 is 20%. -PET scan on 09/14/2020 shows areas of nodularity with discrete nodule in the fat of the inferior chest wall within the subcutaneous tissues. -Ultrasound of the right chest wall 09/28/20 shows masslike  abnormality in the upper inner aspect of the right anterior chest at 1 o'clock position measuring 4.1 cm in greatest dimension.  1.7 cm hypoechoic mass in the central anterior right chest concerning for malignancy.  Possible borderline enlarged residual right axillary lymph node. -Right breast biopsy on 10/12/2020 shows 1.3 cm invasive ductal carcinoma, focally 0.1 cm from superior margin.  DCIS is less than 0.1 cm from superior margin.  PT1CPNX. - adjuvant chemotherapy was recommended given triple negative nature of her cancer, however, given poor tolerance initially, she declined and elected for radiation therapy.  - she completed radiation approximately 6 weeks ago. Continues  to recover.  - recommended genetic counseling today which she declined.    2. Left breast - underwent risk reducing left breast simple mastectomy on 06/15/20. Pathology was benign.   3. Osteopenia- bone density scan 01/20/19 with tscore of -1.9 consistent with osteopenia. Recommended repeating. Calcium 1200 mg and vitamin d 1000 iu daily.   4. ESRD on HD: -She is undergoing hemodialysis under the direction of Dr. Marval Regal on Monday, Wednesday and Friday.  5.  Neuropathy: etiology unclear. Gabapentin at bedtime.    Disposition:  - 2 mo- lab, MD   Orders Placed This Encounter  Procedures   DG Bone Density    Standing Status:   Future    Standing Expiration Date:   03/14/2022    Order Specific Question:   Reason for Exam (SYMPTOM  OR DIAGNOSIS REQUIRED)    Answer:   osteopenia    Order Specific Question:   Is the patient pregnant?    Answer:   No    Order Specific Question:   Preferred imaging location?    Answer:   [facility]   CBC with Differential/Platelet    Standing Status:   Future    Standing Expiration Date:   03/14/2022   Comprehensive metabolic panel    Standing Status:   Future    Standing Expiration Date:   03/14/2022     I discussed the assessment and treatment plan with the patient. The  patient was provided an opportunity to ask questions and all were answered. The patient agreed with the plan and demonstrated an understanding of the instructions.   The patient was advised to call back or seek an in-person evaluation if the symptoms worsen or if the condition fails to improve as anticipated.   I spent 25 minutes face-to-face video visit time dedicated to the care of this patient on the date of this encounter to include pre-visit review of medical oncology notes, pathology reports, imaging (mammograms, u/s, bone density), labs, face-to-face time with the patient, and post visit ordering of testing/documentation.    Beckey Rutter, DNP, AGNP-C

## 2021-03-14 NOTE — Progress Notes (Signed)
Patient is taking Arimidex as prescribed.  They have not missed any doses and report no side effects at this time.

## 2021-03-15 ENCOUNTER — Encounter (HOSPITAL_COMMUNITY): Payer: Self-pay | Admitting: Hematology

## 2021-03-15 ENCOUNTER — Ambulatory Visit (HOSPITAL_COMMUNITY): Payer: Medicare Other | Admitting: Hematology

## 2021-03-15 DIAGNOSIS — E1129 Type 2 diabetes mellitus with other diabetic kidney complication: Secondary | ICD-10-CM | POA: Diagnosis not present

## 2021-03-15 DIAGNOSIS — N186 End stage renal disease: Secondary | ICD-10-CM | POA: Diagnosis not present

## 2021-03-15 DIAGNOSIS — Z992 Dependence on renal dialysis: Secondary | ICD-10-CM | POA: Diagnosis not present

## 2021-03-15 DIAGNOSIS — D509 Iron deficiency anemia, unspecified: Secondary | ICD-10-CM | POA: Diagnosis not present

## 2021-03-15 DIAGNOSIS — N2581 Secondary hyperparathyroidism of renal origin: Secondary | ICD-10-CM | POA: Diagnosis not present

## 2021-03-15 DIAGNOSIS — D631 Anemia in chronic kidney disease: Secondary | ICD-10-CM | POA: Diagnosis not present

## 2021-03-16 DIAGNOSIS — N186 End stage renal disease: Secondary | ICD-10-CM | POA: Diagnosis not present

## 2021-03-16 DIAGNOSIS — N2581 Secondary hyperparathyroidism of renal origin: Secondary | ICD-10-CM | POA: Diagnosis not present

## 2021-03-16 DIAGNOSIS — D631 Anemia in chronic kidney disease: Secondary | ICD-10-CM | POA: Diagnosis not present

## 2021-03-16 DIAGNOSIS — Z992 Dependence on renal dialysis: Secondary | ICD-10-CM | POA: Diagnosis not present

## 2021-03-16 DIAGNOSIS — D509 Iron deficiency anemia, unspecified: Secondary | ICD-10-CM | POA: Diagnosis not present

## 2021-03-16 DIAGNOSIS — E1129 Type 2 diabetes mellitus with other diabetic kidney complication: Secondary | ICD-10-CM | POA: Diagnosis not present

## 2021-03-17 DIAGNOSIS — I871 Compression of vein: Secondary | ICD-10-CM | POA: Diagnosis not present

## 2021-03-17 DIAGNOSIS — Z992 Dependence on renal dialysis: Secondary | ICD-10-CM | POA: Diagnosis not present

## 2021-03-17 DIAGNOSIS — N186 End stage renal disease: Secondary | ICD-10-CM | POA: Diagnosis not present

## 2021-03-18 DIAGNOSIS — D509 Iron deficiency anemia, unspecified: Secondary | ICD-10-CM | POA: Diagnosis not present

## 2021-03-18 DIAGNOSIS — E1129 Type 2 diabetes mellitus with other diabetic kidney complication: Secondary | ICD-10-CM | POA: Diagnosis not present

## 2021-03-18 DIAGNOSIS — N2581 Secondary hyperparathyroidism of renal origin: Secondary | ICD-10-CM | POA: Diagnosis not present

## 2021-03-18 DIAGNOSIS — N186 End stage renal disease: Secondary | ICD-10-CM | POA: Diagnosis not present

## 2021-03-18 DIAGNOSIS — Z992 Dependence on renal dialysis: Secondary | ICD-10-CM | POA: Diagnosis not present

## 2021-03-18 DIAGNOSIS — D631 Anemia in chronic kidney disease: Secondary | ICD-10-CM | POA: Diagnosis not present

## 2021-03-21 DIAGNOSIS — E1129 Type 2 diabetes mellitus with other diabetic kidney complication: Secondary | ICD-10-CM | POA: Diagnosis not present

## 2021-03-21 DIAGNOSIS — N186 End stage renal disease: Secondary | ICD-10-CM | POA: Diagnosis not present

## 2021-03-21 DIAGNOSIS — Z992 Dependence on renal dialysis: Secondary | ICD-10-CM | POA: Diagnosis not present

## 2021-03-21 DIAGNOSIS — D631 Anemia in chronic kidney disease: Secondary | ICD-10-CM | POA: Diagnosis not present

## 2021-03-21 DIAGNOSIS — N2581 Secondary hyperparathyroidism of renal origin: Secondary | ICD-10-CM | POA: Diagnosis not present

## 2021-03-21 DIAGNOSIS — D509 Iron deficiency anemia, unspecified: Secondary | ICD-10-CM | POA: Diagnosis not present

## 2021-03-23 DIAGNOSIS — D509 Iron deficiency anemia, unspecified: Secondary | ICD-10-CM | POA: Diagnosis not present

## 2021-03-23 DIAGNOSIS — Z992 Dependence on renal dialysis: Secondary | ICD-10-CM | POA: Diagnosis not present

## 2021-03-23 DIAGNOSIS — N186 End stage renal disease: Secondary | ICD-10-CM | POA: Diagnosis not present

## 2021-03-23 DIAGNOSIS — E1129 Type 2 diabetes mellitus with other diabetic kidney complication: Secondary | ICD-10-CM | POA: Diagnosis not present

## 2021-03-23 DIAGNOSIS — N2581 Secondary hyperparathyroidism of renal origin: Secondary | ICD-10-CM | POA: Diagnosis not present

## 2021-03-23 DIAGNOSIS — D631 Anemia in chronic kidney disease: Secondary | ICD-10-CM | POA: Diagnosis not present

## 2021-03-24 ENCOUNTER — Other Ambulatory Visit: Payer: Self-pay

## 2021-03-24 ENCOUNTER — Ambulatory Visit (HOSPITAL_COMMUNITY)
Admission: RE | Admit: 2021-03-24 | Discharge: 2021-03-24 | Disposition: A | Discharge status: Home / Self Care | Payer: Medicare Other | Source: Ambulatory Visit | Attending: Nurse Practitioner | Admitting: Nurse Practitioner

## 2021-03-24 DIAGNOSIS — M8580 Other specified disorders of bone density and structure, unspecified site: Secondary | ICD-10-CM

## 2021-03-24 DIAGNOSIS — Z79811 Long term (current) use of aromatase inhibitors: Secondary | ICD-10-CM

## 2021-03-24 DIAGNOSIS — M85852 Other specified disorders of bone density and structure, left thigh: Secondary | ICD-10-CM | POA: Diagnosis not present

## 2021-03-24 DIAGNOSIS — Z780 Asymptomatic menopausal state: Secondary | ICD-10-CM | POA: Diagnosis not present

## 2021-03-25 ENCOUNTER — Telehealth: Payer: Self-pay | Admitting: Nurse Practitioner

## 2021-03-25 DIAGNOSIS — E1129 Type 2 diabetes mellitus with other diabetic kidney complication: Secondary | ICD-10-CM | POA: Diagnosis not present

## 2021-03-25 DIAGNOSIS — D631 Anemia in chronic kidney disease: Secondary | ICD-10-CM | POA: Diagnosis not present

## 2021-03-25 DIAGNOSIS — N186 End stage renal disease: Secondary | ICD-10-CM | POA: Diagnosis not present

## 2021-03-25 DIAGNOSIS — D509 Iron deficiency anemia, unspecified: Secondary | ICD-10-CM | POA: Diagnosis not present

## 2021-03-25 DIAGNOSIS — Z992 Dependence on renal dialysis: Secondary | ICD-10-CM | POA: Diagnosis not present

## 2021-03-25 DIAGNOSIS — M810 Age-related osteoporosis without current pathological fracture: Secondary | ICD-10-CM

## 2021-03-25 DIAGNOSIS — N2581 Secondary hyperparathyroidism of renal origin: Secondary | ICD-10-CM | POA: Diagnosis not present

## 2021-03-25 NOTE — Telephone Encounter (Signed)
Spoke to Irvona and her husband. Findings consistent with osteoporosis. She will consider treatment once she has spoken to DR. K at her next appointment. In the interim I encouraged calcium, vitamin d and weight bearing exercise.

## 2021-03-28 DIAGNOSIS — N2581 Secondary hyperparathyroidism of renal origin: Secondary | ICD-10-CM | POA: Diagnosis not present

## 2021-03-28 DIAGNOSIS — D631 Anemia in chronic kidney disease: Secondary | ICD-10-CM | POA: Diagnosis not present

## 2021-03-28 DIAGNOSIS — D509 Iron deficiency anemia, unspecified: Secondary | ICD-10-CM | POA: Diagnosis not present

## 2021-03-28 DIAGNOSIS — E1129 Type 2 diabetes mellitus with other diabetic kidney complication: Secondary | ICD-10-CM | POA: Diagnosis not present

## 2021-03-28 DIAGNOSIS — N186 End stage renal disease: Secondary | ICD-10-CM | POA: Diagnosis not present

## 2021-03-28 DIAGNOSIS — Z992 Dependence on renal dialysis: Secondary | ICD-10-CM | POA: Diagnosis not present

## 2021-03-29 ENCOUNTER — Telehealth: Payer: Self-pay | Admitting: *Deleted

## 2021-03-29 ENCOUNTER — Other Ambulatory Visit: Payer: Self-pay | Admitting: Family Medicine

## 2021-03-29 DIAGNOSIS — R197 Diarrhea, unspecified: Secondary | ICD-10-CM

## 2021-03-29 DIAGNOSIS — A0472 Enterocolitis due to Clostridium difficile, not specified as recurrent: Secondary | ICD-10-CM

## 2021-03-29 MED ORDER — FIDAXOMICIN 200 MG PO TABS: 200.0000 mg | ORAL_TABLET | Freq: Two times a day (BID) | ORAL | 0 refills | Status: DC

## 2021-03-29 NOTE — Telephone Encounter (Signed)
Received call from patient.   Reports that she has had loose stools that started on 03/26/2021.   Discussed with PCP and recommendations are as follows: Obtain stool sample for C Diff testing.  Begin Dificid 200mg  PO BID x10 days.   Call placed to patient and patient made aware.

## 2021-03-30 ENCOUNTER — Other Ambulatory Visit: Payer: Self-pay

## 2021-03-30 ENCOUNTER — Other Ambulatory Visit: Payer: Medicare Other

## 2021-03-30 DIAGNOSIS — E1129 Type 2 diabetes mellitus with other diabetic kidney complication: Secondary | ICD-10-CM | POA: Diagnosis not present

## 2021-03-30 DIAGNOSIS — Z992 Dependence on renal dialysis: Secondary | ICD-10-CM | POA: Diagnosis not present

## 2021-03-30 DIAGNOSIS — R197 Diarrhea, unspecified: Secondary | ICD-10-CM | POA: Diagnosis not present

## 2021-03-30 DIAGNOSIS — D631 Anemia in chronic kidney disease: Secondary | ICD-10-CM | POA: Diagnosis not present

## 2021-03-30 DIAGNOSIS — D509 Iron deficiency anemia, unspecified: Secondary | ICD-10-CM | POA: Diagnosis not present

## 2021-03-30 DIAGNOSIS — N186 End stage renal disease: Secondary | ICD-10-CM | POA: Diagnosis not present

## 2021-03-30 DIAGNOSIS — N2581 Secondary hyperparathyroidism of renal origin: Secondary | ICD-10-CM | POA: Diagnosis not present

## 2021-03-31 ENCOUNTER — Other Ambulatory Visit: Payer: Self-pay | Admitting: *Deleted

## 2021-03-31 ENCOUNTER — Other Ambulatory Visit: Payer: Self-pay | Admitting: Family Medicine

## 2021-03-31 DIAGNOSIS — A0471 Enterocolitis due to Clostridium difficile, recurrent: Secondary | ICD-10-CM

## 2021-03-31 LAB — C. DIFFICILE GDH AND TOXIN A/B
GDH ANTIGEN: DETECTED
MICRO NUMBER:: 12255325
SPECIMEN QUALITY:: ADEQUATE
TOXIN A AND B: DETECTED

## 2021-03-31 MED ORDER — FIDAXOMICIN 200 MG PO TABS: 200.0000 mg | ORAL_TABLET | ORAL | 0 refills | Status: DC

## 2021-04-01 DIAGNOSIS — N186 End stage renal disease: Secondary | ICD-10-CM | POA: Diagnosis not present

## 2021-04-01 DIAGNOSIS — D631 Anemia in chronic kidney disease: Secondary | ICD-10-CM | POA: Diagnosis not present

## 2021-04-01 DIAGNOSIS — N2581 Secondary hyperparathyroidism of renal origin: Secondary | ICD-10-CM | POA: Diagnosis not present

## 2021-04-01 DIAGNOSIS — Z992 Dependence on renal dialysis: Secondary | ICD-10-CM | POA: Diagnosis not present

## 2021-04-01 DIAGNOSIS — E1129 Type 2 diabetes mellitus with other diabetic kidney complication: Secondary | ICD-10-CM | POA: Diagnosis not present

## 2021-04-01 DIAGNOSIS — D509 Iron deficiency anemia, unspecified: Secondary | ICD-10-CM | POA: Diagnosis not present

## 2021-04-04 DIAGNOSIS — N186 End stage renal disease: Secondary | ICD-10-CM | POA: Diagnosis not present

## 2021-04-04 DIAGNOSIS — E1129 Type 2 diabetes mellitus with other diabetic kidney complication: Secondary | ICD-10-CM | POA: Diagnosis not present

## 2021-04-04 DIAGNOSIS — N2581 Secondary hyperparathyroidism of renal origin: Secondary | ICD-10-CM | POA: Diagnosis not present

## 2021-04-04 DIAGNOSIS — D631 Anemia in chronic kidney disease: Secondary | ICD-10-CM | POA: Diagnosis not present

## 2021-04-04 DIAGNOSIS — D509 Iron deficiency anemia, unspecified: Secondary | ICD-10-CM | POA: Diagnosis not present

## 2021-04-04 DIAGNOSIS — Z992 Dependence on renal dialysis: Secondary | ICD-10-CM | POA: Diagnosis not present

## 2021-04-05 ENCOUNTER — Other Ambulatory Visit: Payer: Self-pay

## 2021-04-05 ENCOUNTER — Encounter: Payer: Self-pay | Admitting: Internal Medicine

## 2021-04-05 ENCOUNTER — Ambulatory Visit (INDEPENDENT_AMBULATORY_CARE_PROVIDER_SITE_OTHER): Payer: Medicare Other | Admitting: Internal Medicine

## 2021-04-05 VITALS — BP 157/83 | HR 79 | Temp 97.6°F | Ht 62.0 in | Wt 172.0 lb

## 2021-04-05 DIAGNOSIS — N186 End stage renal disease: Secondary | ICD-10-CM

## 2021-04-05 DIAGNOSIS — I10 Essential (primary) hypertension: Secondary | ICD-10-CM | POA: Diagnosis not present

## 2021-04-05 DIAGNOSIS — A0472 Enterocolitis due to Clostridium difficile, not specified as recurrent: Secondary | ICD-10-CM

## 2021-04-05 DIAGNOSIS — Z992 Dependence on renal dialysis: Secondary | ICD-10-CM | POA: Diagnosis not present

## 2021-04-05 NOTE — Assessment & Plan Note (Addendum)
She continues on dialysis I discussed fluid management with diarrhea in someone with dialysis.  Can be difficult to find the balance.

## 2021-04-05 NOTE — Assessment & Plan Note (Signed)
Some systolic elevation and is followed by nephrology.

## 2021-04-05 NOTE — Progress Notes (Signed)
   Subjective:    Patient ID: Megan [name], female    DOB: 22-Feb-1961, 60 y.o.   MRN: [id number]  HPI She was re-referred for concern for relapse of C diff infection.   She was hospitalized in July for recurrence of C diff infection and treated with a prolonged taper of Dificid.  She completed this around the end of July and about 2 weeks later noted another episode of loose stools, about 4 one day.  No blood. She was retested for C diff and started on dificid again for 10 days with a plan for dificid every other day after that.  She is not having any significant diarrhea now.     Review of Systems  Constitutional:  Negative for chills and fever.  Gastrointestinal:  Negative for diarrhea and nausea.      Objective:   Physical Exam Eyes:     General: No scleral icterus. Abdominal:     Comments: Some bloating/distended, soft  Neurological:     General: No focal deficit present.     Mental Status: She is alert.   SH: no tobacco       Assessment & Plan:

## 2021-04-05 NOTE — Assessment & Plan Note (Addendum)
Re-referred due to concern for worsening/relapse.   I discussed C diff with her and her husband and what to expect after C diff infection.  I discussed that repeat C diff testing is difficult to interpret since it will remain positive for several months after infection so is not a good guide to reinfection.  Additionally, I would anticipate ongoing intermittent diarrhea with bloating for weeks to months after a C diff infection so a small frequency of loose stools one day does not necessarily indicate a relapse of C diff.  From the history provided, I am not convinced she had a relapse of the infection but rather just residual symptoms.  I will have her finish the 10 day course of Dificid and she can stop at that time and observe off of treatment.  I discussed symptoms to watch for including a high frequency of stool output, likely 10 or more stools per day, watery diarrhea and persistence of it.  She will call if that happens and we can consider Zinplata infusion after further Dificid.   She will follow up by video in one month to discuss symptoms off of treatment.  She will call sooner if she relapses.

## 2021-04-06 DIAGNOSIS — D631 Anemia in chronic kidney disease: Secondary | ICD-10-CM | POA: Diagnosis not present

## 2021-04-06 DIAGNOSIS — Z992 Dependence on renal dialysis: Secondary | ICD-10-CM | POA: Diagnosis not present

## 2021-04-06 DIAGNOSIS — N2581 Secondary hyperparathyroidism of renal origin: Secondary | ICD-10-CM | POA: Diagnosis not present

## 2021-04-06 DIAGNOSIS — D509 Iron deficiency anemia, unspecified: Secondary | ICD-10-CM | POA: Diagnosis not present

## 2021-04-06 DIAGNOSIS — N186 End stage renal disease: Secondary | ICD-10-CM | POA: Diagnosis not present

## 2021-04-06 DIAGNOSIS — E1129 Type 2 diabetes mellitus with other diabetic kidney complication: Secondary | ICD-10-CM | POA: Diagnosis not present

## 2021-04-08 DIAGNOSIS — N2581 Secondary hyperparathyroidism of renal origin: Secondary | ICD-10-CM | POA: Diagnosis not present

## 2021-04-08 DIAGNOSIS — N186 End stage renal disease: Secondary | ICD-10-CM | POA: Diagnosis not present

## 2021-04-08 DIAGNOSIS — D631 Anemia in chronic kidney disease: Secondary | ICD-10-CM | POA: Diagnosis not present

## 2021-04-08 DIAGNOSIS — Z992 Dependence on renal dialysis: Secondary | ICD-10-CM | POA: Diagnosis not present

## 2021-04-08 DIAGNOSIS — E1129 Type 2 diabetes mellitus with other diabetic kidney complication: Secondary | ICD-10-CM | POA: Diagnosis not present

## 2021-04-08 DIAGNOSIS — D509 Iron deficiency anemia, unspecified: Secondary | ICD-10-CM | POA: Diagnosis not present

## 2021-04-11 DIAGNOSIS — N2581 Secondary hyperparathyroidism of renal origin: Secondary | ICD-10-CM | POA: Diagnosis not present

## 2021-04-11 DIAGNOSIS — E1129 Type 2 diabetes mellitus with other diabetic kidney complication: Secondary | ICD-10-CM | POA: Diagnosis not present

## 2021-04-11 DIAGNOSIS — D509 Iron deficiency anemia, unspecified: Secondary | ICD-10-CM | POA: Diagnosis not present

## 2021-04-11 DIAGNOSIS — D631 Anemia in chronic kidney disease: Secondary | ICD-10-CM | POA: Diagnosis not present

## 2021-04-11 DIAGNOSIS — N186 End stage renal disease: Secondary | ICD-10-CM | POA: Diagnosis not present

## 2021-04-11 DIAGNOSIS — Z992 Dependence on renal dialysis: Secondary | ICD-10-CM | POA: Diagnosis not present

## 2021-04-13 DIAGNOSIS — Z992 Dependence on renal dialysis: Secondary | ICD-10-CM | POA: Diagnosis not present

## 2021-04-13 DIAGNOSIS — D509 Iron deficiency anemia, unspecified: Secondary | ICD-10-CM | POA: Diagnosis not present

## 2021-04-13 DIAGNOSIS — N186 End stage renal disease: Secondary | ICD-10-CM | POA: Diagnosis not present

## 2021-04-13 DIAGNOSIS — E1129 Type 2 diabetes mellitus with other diabetic kidney complication: Secondary | ICD-10-CM | POA: Diagnosis not present

## 2021-04-13 DIAGNOSIS — N2581 Secondary hyperparathyroidism of renal origin: Secondary | ICD-10-CM | POA: Diagnosis not present

## 2021-04-13 DIAGNOSIS — D631 Anemia in chronic kidney disease: Secondary | ICD-10-CM | POA: Diagnosis not present

## 2021-04-14 DIAGNOSIS — N186 End stage renal disease: Secondary | ICD-10-CM | POA: Diagnosis not present

## 2021-04-14 DIAGNOSIS — Z992 Dependence on renal dialysis: Secondary | ICD-10-CM | POA: Diagnosis not present

## 2021-04-14 DIAGNOSIS — N040 Nephrotic syndrome with minor glomerular abnormality: Secondary | ICD-10-CM | POA: Diagnosis not present

## 2021-04-15 DIAGNOSIS — E1129 Type 2 diabetes mellitus with other diabetic kidney complication: Secondary | ICD-10-CM | POA: Diagnosis not present

## 2021-04-15 DIAGNOSIS — D631 Anemia in chronic kidney disease: Secondary | ICD-10-CM | POA: Diagnosis not present

## 2021-04-15 DIAGNOSIS — Z23 Encounter for immunization: Secondary | ICD-10-CM | POA: Diagnosis not present

## 2021-04-15 DIAGNOSIS — E119 Type 2 diabetes mellitus without complications: Secondary | ICD-10-CM | POA: Diagnosis not present

## 2021-04-15 DIAGNOSIS — N2581 Secondary hyperparathyroidism of renal origin: Secondary | ICD-10-CM | POA: Diagnosis not present

## 2021-04-15 DIAGNOSIS — Z992 Dependence on renal dialysis: Secondary | ICD-10-CM | POA: Diagnosis not present

## 2021-04-15 DIAGNOSIS — E8770 Fluid overload, unspecified: Secondary | ICD-10-CM | POA: Diagnosis not present

## 2021-04-15 DIAGNOSIS — N186 End stage renal disease: Secondary | ICD-10-CM | POA: Diagnosis not present

## 2021-04-15 DIAGNOSIS — D509 Iron deficiency anemia, unspecified: Secondary | ICD-10-CM | POA: Diagnosis not present

## 2021-04-18 DIAGNOSIS — N186 End stage renal disease: Secondary | ICD-10-CM | POA: Diagnosis not present

## 2021-04-18 DIAGNOSIS — N2581 Secondary hyperparathyroidism of renal origin: Secondary | ICD-10-CM | POA: Diagnosis not present

## 2021-04-18 DIAGNOSIS — D509 Iron deficiency anemia, unspecified: Secondary | ICD-10-CM | POA: Diagnosis not present

## 2021-04-18 DIAGNOSIS — D631 Anemia in chronic kidney disease: Secondary | ICD-10-CM | POA: Diagnosis not present

## 2021-04-18 DIAGNOSIS — Z992 Dependence on renal dialysis: Secondary | ICD-10-CM | POA: Diagnosis not present

## 2021-04-18 DIAGNOSIS — E1129 Type 2 diabetes mellitus with other diabetic kidney complication: Secondary | ICD-10-CM | POA: Diagnosis not present

## 2021-04-20 ENCOUNTER — Other Ambulatory Visit: Payer: Self-pay | Admitting: Family Medicine

## 2021-04-20 DIAGNOSIS — D509 Iron deficiency anemia, unspecified: Secondary | ICD-10-CM | POA: Diagnosis not present

## 2021-04-20 DIAGNOSIS — N186 End stage renal disease: Secondary | ICD-10-CM | POA: Diagnosis not present

## 2021-04-20 DIAGNOSIS — E1129 Type 2 diabetes mellitus with other diabetic kidney complication: Secondary | ICD-10-CM | POA: Diagnosis not present

## 2021-04-20 DIAGNOSIS — Z992 Dependence on renal dialysis: Secondary | ICD-10-CM | POA: Diagnosis not present

## 2021-04-20 DIAGNOSIS — N2581 Secondary hyperparathyroidism of renal origin: Secondary | ICD-10-CM | POA: Diagnosis not present

## 2021-04-20 DIAGNOSIS — D631 Anemia in chronic kidney disease: Secondary | ICD-10-CM | POA: Diagnosis not present

## 2021-04-22 DIAGNOSIS — Z992 Dependence on renal dialysis: Secondary | ICD-10-CM | POA: Diagnosis not present

## 2021-04-22 DIAGNOSIS — D509 Iron deficiency anemia, unspecified: Secondary | ICD-10-CM | POA: Diagnosis not present

## 2021-04-22 DIAGNOSIS — N2581 Secondary hyperparathyroidism of renal origin: Secondary | ICD-10-CM | POA: Diagnosis not present

## 2021-04-22 DIAGNOSIS — N186 End stage renal disease: Secondary | ICD-10-CM | POA: Diagnosis not present

## 2021-04-22 DIAGNOSIS — E1129 Type 2 diabetes mellitus with other diabetic kidney complication: Secondary | ICD-10-CM | POA: Diagnosis not present

## 2021-04-22 DIAGNOSIS — D631 Anemia in chronic kidney disease: Secondary | ICD-10-CM | POA: Diagnosis not present

## 2021-04-25 DIAGNOSIS — N186 End stage renal disease: Secondary | ICD-10-CM | POA: Diagnosis not present

## 2021-04-25 DIAGNOSIS — N2581 Secondary hyperparathyroidism of renal origin: Secondary | ICD-10-CM | POA: Diagnosis not present

## 2021-04-25 DIAGNOSIS — E1129 Type 2 diabetes mellitus with other diabetic kidney complication: Secondary | ICD-10-CM | POA: Diagnosis not present

## 2021-04-25 DIAGNOSIS — Z992 Dependence on renal dialysis: Secondary | ICD-10-CM | POA: Diagnosis not present

## 2021-04-25 DIAGNOSIS — D631 Anemia in chronic kidney disease: Secondary | ICD-10-CM | POA: Diagnosis not present

## 2021-04-25 DIAGNOSIS — D509 Iron deficiency anemia, unspecified: Secondary | ICD-10-CM | POA: Diagnosis not present

## 2021-04-27 DIAGNOSIS — N2581 Secondary hyperparathyroidism of renal origin: Secondary | ICD-10-CM | POA: Diagnosis not present

## 2021-04-27 DIAGNOSIS — N186 End stage renal disease: Secondary | ICD-10-CM | POA: Diagnosis not present

## 2021-04-27 DIAGNOSIS — D631 Anemia in chronic kidney disease: Secondary | ICD-10-CM | POA: Diagnosis not present

## 2021-04-27 DIAGNOSIS — D509 Iron deficiency anemia, unspecified: Secondary | ICD-10-CM | POA: Diagnosis not present

## 2021-04-27 DIAGNOSIS — E1129 Type 2 diabetes mellitus with other diabetic kidney complication: Secondary | ICD-10-CM | POA: Diagnosis not present

## 2021-04-27 DIAGNOSIS — Z992 Dependence on renal dialysis: Secondary | ICD-10-CM | POA: Diagnosis not present

## 2021-04-29 DIAGNOSIS — N186 End stage renal disease: Secondary | ICD-10-CM | POA: Diagnosis not present

## 2021-04-29 DIAGNOSIS — N2581 Secondary hyperparathyroidism of renal origin: Secondary | ICD-10-CM | POA: Diagnosis not present

## 2021-04-29 DIAGNOSIS — D631 Anemia in chronic kidney disease: Secondary | ICD-10-CM | POA: Diagnosis not present

## 2021-04-29 DIAGNOSIS — E1129 Type 2 diabetes mellitus with other diabetic kidney complication: Secondary | ICD-10-CM | POA: Diagnosis not present

## 2021-04-29 DIAGNOSIS — D509 Iron deficiency anemia, unspecified: Secondary | ICD-10-CM | POA: Diagnosis not present

## 2021-04-29 DIAGNOSIS — Z992 Dependence on renal dialysis: Secondary | ICD-10-CM | POA: Diagnosis not present

## 2021-05-02 DIAGNOSIS — D631 Anemia in chronic kidney disease: Secondary | ICD-10-CM | POA: Diagnosis not present

## 2021-05-02 DIAGNOSIS — D509 Iron deficiency anemia, unspecified: Secondary | ICD-10-CM | POA: Diagnosis not present

## 2021-05-02 DIAGNOSIS — N2581 Secondary hyperparathyroidism of renal origin: Secondary | ICD-10-CM | POA: Diagnosis not present

## 2021-05-02 DIAGNOSIS — N186 End stage renal disease: Secondary | ICD-10-CM | POA: Diagnosis not present

## 2021-05-02 DIAGNOSIS — E1129 Type 2 diabetes mellitus with other diabetic kidney complication: Secondary | ICD-10-CM | POA: Diagnosis not present

## 2021-05-02 DIAGNOSIS — Z992 Dependence on renal dialysis: Secondary | ICD-10-CM | POA: Diagnosis not present

## 2021-05-03 ENCOUNTER — Other Ambulatory Visit: Payer: Self-pay

## 2021-05-03 ENCOUNTER — Ambulatory Visit (INDEPENDENT_AMBULATORY_CARE_PROVIDER_SITE_OTHER): Payer: Medicare Other | Admitting: Internal Medicine

## 2021-05-03 ENCOUNTER — Ambulatory Visit: Payer: Medicare Other | Admitting: Internal Medicine

## 2021-05-03 ENCOUNTER — Encounter: Payer: Self-pay | Admitting: Internal Medicine

## 2021-05-03 VITALS — BP 136/78 | HR 86 | Temp 99.0°F | Wt 173.4 lb

## 2021-05-03 DIAGNOSIS — A0472 Enterocolitis due to Clostridium difficile, not specified as recurrent: Secondary | ICD-10-CM

## 2021-05-03 DIAGNOSIS — R5383 Other fatigue: Secondary | ICD-10-CM | POA: Diagnosis not present

## 2021-05-03 NOTE — Progress Notes (Signed)
   Subjective:    Patient ID: Megan [name], female    DOB: July 18, 1961, 60 y.o.   MRN: [id number]  HPI Here for follow up of previous C diff infection She was seen last month with post infectious symptoms of bloating, mild diarrhea and concerns for relapse.  She was on another course of Dificid as a taper and was having no diarrhea at the time of the visit.  By history with intermittent diarrhea, bloating it was most consistent with residual symptoms and can take months to resolve.  C diff testing discussed as it will remain positive for month and retesting not indicated.  Still with some bloating and occasional nausea but no diarrhea now.  No weight loss, no blood in the stool.     Review of Systems  Constitutional:  Positive for fatigue. Negative for fever.  Gastrointestinal:  Positive for abdominal distention. Negative for diarrhea.      Objective:   Physical Exam Eyes:     General: No scleral icterus. Pulmonary:     Effort: Pulmonary effort is normal.  Abdominal:     General: There is distension.     Palpations: Abdomen is soft.  Neurological:     Mental Status: She is alert.          Assessment & Plan:

## 2021-05-03 NOTE — Assessment & Plan Note (Signed)
Continues to be resolve with some residual symptoms not related to active C diff infection.  No further treatment indicated.  She will call back with any changes.

## 2021-05-03 NOTE — Assessment & Plan Note (Signed)
She has some ongoing fatigue and discussed she needs to increase her activity and exercise more.

## 2021-05-04 DIAGNOSIS — N2581 Secondary hyperparathyroidism of renal origin: Secondary | ICD-10-CM | POA: Diagnosis not present

## 2021-05-04 DIAGNOSIS — E1129 Type 2 diabetes mellitus with other diabetic kidney complication: Secondary | ICD-10-CM | POA: Diagnosis not present

## 2021-05-04 DIAGNOSIS — N186 End stage renal disease: Secondary | ICD-10-CM | POA: Diagnosis not present

## 2021-05-04 DIAGNOSIS — D631 Anemia in chronic kidney disease: Secondary | ICD-10-CM | POA: Diagnosis not present

## 2021-05-04 DIAGNOSIS — D509 Iron deficiency anemia, unspecified: Secondary | ICD-10-CM | POA: Diagnosis not present

## 2021-05-04 DIAGNOSIS — Z992 Dependence on renal dialysis: Secondary | ICD-10-CM | POA: Diagnosis not present

## 2021-05-06 DIAGNOSIS — N2581 Secondary hyperparathyroidism of renal origin: Secondary | ICD-10-CM | POA: Diagnosis not present

## 2021-05-06 DIAGNOSIS — D509 Iron deficiency anemia, unspecified: Secondary | ICD-10-CM | POA: Diagnosis not present

## 2021-05-06 DIAGNOSIS — E1129 Type 2 diabetes mellitus with other diabetic kidney complication: Secondary | ICD-10-CM | POA: Diagnosis not present

## 2021-05-06 DIAGNOSIS — D631 Anemia in chronic kidney disease: Secondary | ICD-10-CM | POA: Diagnosis not present

## 2021-05-06 DIAGNOSIS — N186 End stage renal disease: Secondary | ICD-10-CM | POA: Diagnosis not present

## 2021-05-06 DIAGNOSIS — Z992 Dependence on renal dialysis: Secondary | ICD-10-CM | POA: Diagnosis not present

## 2021-05-09 DIAGNOSIS — D631 Anemia in chronic kidney disease: Secondary | ICD-10-CM | POA: Diagnosis not present

## 2021-05-09 DIAGNOSIS — E1129 Type 2 diabetes mellitus with other diabetic kidney complication: Secondary | ICD-10-CM | POA: Diagnosis not present

## 2021-05-09 DIAGNOSIS — Z992 Dependence on renal dialysis: Secondary | ICD-10-CM | POA: Diagnosis not present

## 2021-05-09 DIAGNOSIS — D509 Iron deficiency anemia, unspecified: Secondary | ICD-10-CM | POA: Diagnosis not present

## 2021-05-09 DIAGNOSIS — N2581 Secondary hyperparathyroidism of renal origin: Secondary | ICD-10-CM | POA: Diagnosis not present

## 2021-05-09 DIAGNOSIS — N186 End stage renal disease: Secondary | ICD-10-CM | POA: Diagnosis not present

## 2021-05-10 DIAGNOSIS — N2581 Secondary hyperparathyroidism of renal origin: Secondary | ICD-10-CM | POA: Diagnosis not present

## 2021-05-10 DIAGNOSIS — D509 Iron deficiency anemia, unspecified: Secondary | ICD-10-CM | POA: Diagnosis not present

## 2021-05-10 DIAGNOSIS — Z992 Dependence on renal dialysis: Secondary | ICD-10-CM | POA: Diagnosis not present

## 2021-05-10 DIAGNOSIS — N186 End stage renal disease: Secondary | ICD-10-CM | POA: Diagnosis not present

## 2021-05-10 DIAGNOSIS — D631 Anemia in chronic kidney disease: Secondary | ICD-10-CM | POA: Diagnosis not present

## 2021-05-10 DIAGNOSIS — E1129 Type 2 diabetes mellitus with other diabetic kidney complication: Secondary | ICD-10-CM | POA: Diagnosis not present

## 2021-05-11 ENCOUNTER — Encounter: Payer: Self-pay | Admitting: Gastroenterology

## 2021-05-11 ENCOUNTER — Encounter: Payer: Self-pay | Admitting: Internal Medicine

## 2021-05-11 ENCOUNTER — Ambulatory Visit (INDEPENDENT_AMBULATORY_CARE_PROVIDER_SITE_OTHER): Payer: Medicare Other | Admitting: Gastroenterology

## 2021-05-11 ENCOUNTER — Other Ambulatory Visit: Payer: Self-pay

## 2021-05-11 ENCOUNTER — Telehealth: Payer: Self-pay

## 2021-05-11 VITALS — BP 146/84 | HR 83 | Temp 97.1°F | Ht 62.0 in | Wt 174.2 lb

## 2021-05-11 DIAGNOSIS — K625 Hemorrhage of anus and rectum: Secondary | ICD-10-CM

## 2021-05-11 DIAGNOSIS — R1012 Left upper quadrant pain: Secondary | ICD-10-CM

## 2021-05-11 NOTE — Telephone Encounter (Signed)
Pt aware we will call her to schedule TCS/EGD w/Propofol when Dr. Roseanne Kaufman next schedule is available. Roseanne Kaufman NP advised ASA 3. Pt needs procedure on Tues or Thurs d/t dialysis.

## 2021-05-11 NOTE — Progress Notes (Signed)
Referring Provider: Eulogio Bear, NP Primary Care Physician:  [name], MD Primary GI: Dr. [name]  Chief Complaint  Patient presents with   Diarrhea    Had C. Diff and was in hospital- diarrhea not as bad now   Bloated    HPI:   Megan [name] is a 60 y.o. female presenting today with a history of internal hemorrhoids s/p banding in 2021, last colonoscopy in 2017, Cdiff in July with hospitalization and treated with prolonged taper of Dificid. Had recurrent diarrhea 2 weeks after this and prescribed Dificid again for 10 days with taper. She has seen ID and felt to have post-infectious symptoms.    Had 2 episodes of painless hematochezia over the weekend. She is in the process of arranging a first appointment for renal transplant evaluation. Desires colonoscopy as states they were inquiring about her last evaluation. Feels bloated but improved. Just occasional diarrhea. Normal BMs otherwise. Sometimes LUQ abdominal pain but not related to eating. Intermittent, several times a week. Present for several months. No N/V. Decreased appetite.  No weight loss. Pantoprazole once per day controls GERD. No dysphagia.   Past Medical History:  Diagnosis Date   (HFpEF) heart failure with preserved ejection fraction ([place])    a. 01/2019 Echo: EF 55-60%, mild conc LVH. DD.  Torn MV chordae.   Anemia    Atypical chest pain    a. 08/2018 MV: EF 59%, no ischemia; b. 02/2019 Cath: nonobs dzs.   Blood transfusion without reported diagnosis    Breast cancer ([place]) 10/12/2020   Cataract    ESRD (end stage renal disease) on dialysis [facility])    a. HD T, T, S   Essential hypertension, benign    GERD (gastroesophageal reflux disease)    Headache    Hemorrhoids    Mixed hyperlipidemia    Morbid obesity (HCC)    Non-obstructive CAD (coronary artery disease)    a. 02/2019 CathL LM nl, LAD 15m, LCX nl, RCA 25p, EF 55-65%.   PONV (postoperative nausea and vomiting)    S/P colonoscopy  Jan 2011   Dr. Benson Norway: sessile polyp (benign lymphoid), large hemorrhoids, repeat 5-10 years   Temporal arteritis ([place])    Type 2 diabetes mellitus ([place])    Wears glasses     Past Surgical History:  Procedure Laterality Date   ABDOMINAL HYSTERECTOMY     APPENDECTOMY     ARTERY BIOPSY N/A 05/09/2018   Procedure: RIGHT TEMPORAL ARTERY BIOPSY;  Surgeon: Judeth Horn, MD;  Location: [place];  Service: General;  Laterality: N/A;   BREAST BIOPSY Right 06/15/2020   Procedure: RIGHT BREAST BIOPSY;  Surgeon: Aviva Signs, MD;  Location: [facility];  Service: General;  Laterality: Right;   CATARACT EXTRACTION W/PHACO Left 02/09/2017   Procedure: CATARACT EXTRACTION PHACO AND INTRAOCULAR LENS PLACEMENT LEFT EYE;  Surgeon: Tonny Branch, MD;  Location: [facility];  Service: Ophthalmology;  Laterality: Left;  CDE: 4.89   CATARACT EXTRACTION W/PHACO Right 06/04/2017   Procedure: CATARACT EXTRACTION PHACO AND INTRAOCULAR LENS PLACEMENT (IOC);  Surgeon: Tonny Branch, MD;  Location: [facility];  Service: Ophthalmology;  Laterality: Right;  CDE: 4.12   CHOLECYSTECTOMY  09/29/2011   Procedure: LAPAROSCOPIC CHOLECYSTECTOMY;  Surgeon: Jamesetta So, MD;  Location: [facility];  Service: General;  Laterality: N/A;   COLONOSCOPY  08/2009   Dr. Benson Norway: sessile polyp (benign lymphoid), large hemorrhoids, repeat 5-10 years   COLONOSCOPY N/A 06/12/2016   prominent hemorrhoids  ESOPHAGOGASTRODUODENOSCOPY  09/05/2011   KWI:[id number] hiatal hernia; remainder of exam normal. No explanation for patient's abdominal pain with today's examination   ESOPHAGOGASTRODUODENOSCOPY N/A 12/17/2013   Dr. [name]: gastric erythema, erosion, mild chronic inflammation on path    EXCISION OF BREAST BIOPSY Right 10/12/2020   Procedure: EXCISION OF RIGHT BREAST BIOPSY;  Surgeon: Aviva Signs, MD;  Location: [facility];  Service: General;  Laterality: Right;   LAPAROSCOPIC APPENDECTOMY  09/29/2011   Procedure: APPENDECTOMY LAPAROSCOPIC;  Surgeon: Jamesetta So, MD;   Location: [facility];  Service: General;;  incidental appendectomy   LEFT HEART CATH AND CORONARY ANGIOGRAPHY N/A 02/28/2019   Procedure: LEFT HEART CATH AND CORONARY ANGIOGRAPHY;  Surgeon: Jettie Booze, MD;  Location: [place] [facility];  Service: Cardiovascular;  Laterality: N/A;   MASTECTOMY MODIFIED RADICAL Right 02/18/2020   Procedure: MASTECTOMY MODIFIED RADICAL;  Surgeon: Aviva Signs, MD;  Location: [facility];  Service: General;  Laterality: Right;   MASTECTOMY, PARTIAL Right 07/13/2020   Procedure: RIGHT PARTIAL MASTECTOMY;  Surgeon: Aviva Signs, MD;  Location: [facility];  Service: General;  Laterality: Right;   PARTIAL MASTECTOMY WITH NEEDLE LOCALIZATION AND AXILLARY SENTINEL LYMPH NODE BX Right 09/18/2018   Procedure: RIGHT PARTIAL MASTECTOMY AFTER NEEDLE LOCALIZATION, SENTINEL LYMPH NODE BIOPSY RIGHT AXILLA;  Surgeon: Aviva Signs, MD;  Location: [facility];  Service: General;  Laterality: Right;   SIMPLE MASTECTOMY WITH AXILLARY SENTINEL NODE BIOPSY Left 06/15/2020   Procedure: LEFT SIMPLE MASTECTOMY;  Surgeon: Aviva Signs, MD;  Location: [facility];  Service: General;  Laterality: Left;    Current Outpatient Medications  Medication Sig Dispense Refill   amLODipine (NORVASC) 10 MG tablet TAKE 1 TABLET BY MOUTH AT BEDTIME 90 tablet 3   anastrozole (ARIMIDEX) 1 MG tablet TAKE 1 TABLET(1 MG) BY MOUTH DAILY 30 tablet 4   aspirin EC 81 MG tablet Take 81 mg by mouth daily.     atorvastatin (LIPITOR) 20 MG tablet Take 1 tablet (20 mg total) by mouth daily. 90 tablet 3   B Complex-C-Folic Acid (DIALYVITE 329) 0.8 MG TABS Take 1 tablet by mouth daily.     cinacalcet (SENSIPAR) 30 MG tablet Take 2 tablets (60 mg total) by mouth daily. 90 tablet 0   DULoxetine (CYMBALTA) 30 MG capsule TAKE 1 CAPSULE(30 MG) BY MOUTH DAILY 90 capsule 1   gabapentin (NEURONTIN) 300 MG capsule TAKE 1 CAPSULE(300 MG) BY MOUTH AT BEDTIME 90 capsule 1   lanthanum (FOSRENOL) 1000 MG chewable tablet Chew 2,000-3,000 mg  by mouth See admin instructions. Take 3 tablets (3000 mg) by mouth with meals and take 2 tablets (2000 mg) with snacks     lidocaine-prilocaine (EMLA) cream Apply 1 application topically every Monday, Wednesday, and Friday with hemodialysis.     meclizine (ANTIVERT) 25 MG tablet Take 1 tablet (25 mg total) by mouth 3 (three) times daily as needed for dizziness. 30 tablet 0   nebivolol (BYSTOLIC) 10 MG tablet TAKE 1 TABLET IN THE MORNING AND AT BEDTIME 180 tablet 3   pantoprazole (PROTONIX) 40 MG tablet TAKE 1 TABLET(40 MG) BY MOUTH DAILY 90 tablet 3   No current facility-administered medications for this visit.    Allergies as of 05/11/2021   (No Known Allergies)    Family History  Problem Relation Age of Onset   Hypertension Mother    Coronary artery disease Mother    Diabetes Mother    Hypertension Sister    Coronary artery disease Sister    Hypertension Brother  Heart attack Father    Hypertension Son    Heart attack Maternal Aunt    Hypertension Maternal Aunt    Diabetes Maternal Aunt    Heart attack Maternal Uncle    Hypertension Maternal Uncle    Diabetes Maternal Uncle    Heart attack Paternal Aunt    Hypertension Paternal Aunt    Diabetes Paternal Aunt    Heart attack Paternal Uncle    Hypertension Paternal Uncle    Diabetes Paternal Uncle    Heart attack Maternal Grandmother    Heart attack Maternal Grandfather    Heart attack Paternal Grandmother    Heart attack Paternal Grandfather    Colon cancer Neg Hx     Social History   Socioeconomic History   Marital status: Married    Spouse name: Not on file   Number of children: Not on file   Years of education: Not on file   Highest education level: Not on file  Occupational History   Occupation: Merchandiser, retail: [id number]  Tobacco Use   Smoking status: Never   Smokeless tobacco: Never  Vaping Use   Vaping Use: Never used  Substance and Sexual Activity   Alcohol use: No   Drug use: No    Sexual activity: Yes    Birth control/protection: Surgical  Other Topics Concern   Not on file  Social History Narrative   Works at [employer] in [place].    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   Also has to get items from high shelves-causes achy pain in shoulder area      Married.   Children are grown, out of house.   Social Determinants of Health   Financial Resource Strain: Not on file  Food Insecurity: Not on file  Transportation Needs: Not on file  Physical Activity: Not on file  Stress: Not on file  Social Connections: Not on file    Review of Systems: Gen: Denies fever, chills, anorexia. Denies fatigue, weakness, weight loss.  CV: Denies chest pain, palpitations, syncope, peripheral edema, and claudication. Resp: Denies dyspnea at rest, cough, wheezing, coughing up blood, and pleurisy. GI: see HPI Derm: Denies rash, itching, dry skin Psych: Denies depression, anxiety, memory loss, confusion. No homicidal or suicidal ideation.  Heme: see HPI  Physical Exam: BP (!) 146/84   Pulse 83   Temp (!) 97.1 F (36.2 C) (Temporal)   Ht 5\' 2"  (1.575 m)   Wt 174 lb 3.2 oz (79 kg)   BMI 31.86 kg/m  General:   Alert and oriented. No distress noted. Pleasant and cooperative.  Head:  Normocephalic and atraumatic. Eyes:  Conjuctiva clear without scleral icterus. Mouth:  mask in place Cardiac: S1 S2 present without murmurs Lungs: clear bilaterally Abdomen:  +BS, soft, TTP LUQ and non-distended. No rebound or guarding. No HSM or masses noted. Msk:  Symmetrical without gross deformities. Normal posture. Extremities:  Without edema. Neurologic:  Alert and  oriented x4 Psych:  Alert and cooperative. Normal mood and affect.  ASSESSMENT: Megan [name] is a 60 y.o. female presenting today with history of rectal bleeding due to hemorrhoids and successful banding in 2021, recent bout with Cdiff over the summer and recurrence requiring Dificid with taper,  presenting with reports of rectal bleeding and LUQ pain.   Rectal bleeding: 2 episodes of moderate amount over the weekend that was painless. I suspect anorectal source. However, last colonoscopy in 2017. No  family history of colorectal cancer or adenomas; she has no personal history of adenomas. She is in process of arranging renal transplant evaluation. Will go ahead and pursue diagnostic colonoscopy due to rectal bleeding and will also need this on file due to renal transplant evaluation.   Cdiff: resolved. ID has evaluated patient.   LUQ pain: without postprandial component. No associated N/V. However, she does note early satiety. Last EGD 2015. Holding off on imaging at this point unless worsens. Will pursue EGD at time of colonoscopy. Continue with Protonix daily. May have musculoskeletal component underlying.   PLAN:  Proceed with colonoscopy/EGD by Dr. [name] in near future with Propofol: the risks, benefits, and alternatives have been discussed with the patient in detail. The patient states understanding and desires to proceed.  Call if any changes in LUQ pain, worsening, etc  Continue Protonix daily  Return in follow-up thereafter   Annitta Needs, PhD, ANP-BC [facility] Gastroenterology

## 2021-05-11 NOTE — Patient Instructions (Signed)
We are arranging a colonoscopy and upper endoscopy with Dr. [name] in the future.   Please let me know if abdominal pain worsens!  We will see you in 3-4 months!  I enjoyed seeing you again today! As you know, I value our relationship and want to provide genuine, compassionate, and quality care. I welcome your feedback. If you receive a survey regarding your visit,  I greatly appreciate you taking time to fill this out. See you next time!  Annitta Needs, PhD, ANP-BC [facility] Gastroenterology

## 2021-05-12 DIAGNOSIS — D509 Iron deficiency anemia, unspecified: Secondary | ICD-10-CM | POA: Diagnosis not present

## 2021-05-12 DIAGNOSIS — E1129 Type 2 diabetes mellitus with other diabetic kidney complication: Secondary | ICD-10-CM | POA: Diagnosis not present

## 2021-05-12 DIAGNOSIS — N2581 Secondary hyperparathyroidism of renal origin: Secondary | ICD-10-CM | POA: Diagnosis not present

## 2021-05-12 DIAGNOSIS — D631 Anemia in chronic kidney disease: Secondary | ICD-10-CM | POA: Diagnosis not present

## 2021-05-12 DIAGNOSIS — Z992 Dependence on renal dialysis: Secondary | ICD-10-CM | POA: Diagnosis not present

## 2021-05-12 DIAGNOSIS — N186 End stage renal disease: Secondary | ICD-10-CM | POA: Diagnosis not present

## 2021-05-13 DIAGNOSIS — Z992 Dependence on renal dialysis: Secondary | ICD-10-CM | POA: Diagnosis not present

## 2021-05-13 DIAGNOSIS — N186 End stage renal disease: Secondary | ICD-10-CM | POA: Diagnosis not present

## 2021-05-13 DIAGNOSIS — D509 Iron deficiency anemia, unspecified: Secondary | ICD-10-CM | POA: Diagnosis not present

## 2021-05-13 DIAGNOSIS — N2581 Secondary hyperparathyroidism of renal origin: Secondary | ICD-10-CM | POA: Diagnosis not present

## 2021-05-13 DIAGNOSIS — D631 Anemia in chronic kidney disease: Secondary | ICD-10-CM | POA: Diagnosis not present

## 2021-05-13 DIAGNOSIS — E1129 Type 2 diabetes mellitus with other diabetic kidney complication: Secondary | ICD-10-CM | POA: Diagnosis not present

## 2021-05-14 DIAGNOSIS — N186 End stage renal disease: Secondary | ICD-10-CM | POA: Diagnosis not present

## 2021-05-14 DIAGNOSIS — Z992 Dependence on renal dialysis: Secondary | ICD-10-CM | POA: Diagnosis not present

## 2021-05-14 DIAGNOSIS — N040 Nephrotic syndrome with minor glomerular abnormality: Secondary | ICD-10-CM | POA: Diagnosis not present

## 2021-05-16 ENCOUNTER — Other Ambulatory Visit (HOSPITAL_COMMUNITY): Payer: Medicare Other

## 2021-05-16 DIAGNOSIS — E8779 Other fluid overload: Secondary | ICD-10-CM | POA: Diagnosis not present

## 2021-05-16 DIAGNOSIS — D631 Anemia in chronic kidney disease: Secondary | ICD-10-CM | POA: Diagnosis not present

## 2021-05-16 DIAGNOSIS — E119 Type 2 diabetes mellitus without complications: Secondary | ICD-10-CM | POA: Diagnosis not present

## 2021-05-16 DIAGNOSIS — N2581 Secondary hyperparathyroidism of renal origin: Secondary | ICD-10-CM | POA: Diagnosis not present

## 2021-05-16 DIAGNOSIS — Z992 Dependence on renal dialysis: Secondary | ICD-10-CM | POA: Diagnosis not present

## 2021-05-16 DIAGNOSIS — E1129 Type 2 diabetes mellitus with other diabetic kidney complication: Secondary | ICD-10-CM | POA: Diagnosis not present

## 2021-05-16 DIAGNOSIS — N186 End stage renal disease: Secondary | ICD-10-CM | POA: Diagnosis not present

## 2021-05-17 ENCOUNTER — Inpatient Hospital Stay (HOSPITAL_COMMUNITY): Payer: Medicare Other | Attending: Hematology

## 2021-05-17 ENCOUNTER — Other Ambulatory Visit: Payer: Self-pay

## 2021-05-17 DIAGNOSIS — Z170 Estrogen receptor positive status [ER+]: Secondary | ICD-10-CM | POA: Diagnosis not present

## 2021-05-17 DIAGNOSIS — C50211 Malignant neoplasm of upper-inner quadrant of right female breast: Secondary | ICD-10-CM | POA: Diagnosis not present

## 2021-05-17 DIAGNOSIS — N186 End stage renal disease: Secondary | ICD-10-CM | POA: Insufficient documentation

## 2021-05-17 DIAGNOSIS — Z992 Dependence on renal dialysis: Secondary | ICD-10-CM | POA: Diagnosis not present

## 2021-05-17 DIAGNOSIS — R079 Chest pain, unspecified: Secondary | ICD-10-CM | POA: Insufficient documentation

## 2021-05-17 DIAGNOSIS — M8580 Other specified disorders of bone density and structure, unspecified site: Secondary | ICD-10-CM | POA: Insufficient documentation

## 2021-05-17 DIAGNOSIS — Z79811 Long term (current) use of aromatase inhibitors: Secondary | ICD-10-CM | POA: Insufficient documentation

## 2021-05-17 DIAGNOSIS — Z9011 Acquired absence of right breast and nipple: Secondary | ICD-10-CM | POA: Insufficient documentation

## 2021-05-17 DIAGNOSIS — Z7982 Long term (current) use of aspirin: Secondary | ICD-10-CM | POA: Diagnosis not present

## 2021-05-17 DIAGNOSIS — R42 Dizziness and giddiness: Secondary | ICD-10-CM | POA: Diagnosis not present

## 2021-05-17 DIAGNOSIS — G629 Polyneuropathy, unspecified: Secondary | ICD-10-CM | POA: Diagnosis not present

## 2021-05-17 LAB — COMPREHENSIVE METABOLIC PANEL
ALT: 21 U/L (ref 0–44)
AST: 17 U/L (ref 15–41)
Albumin: 3.6 g/dL (ref 3.5–5.0)
Alkaline Phosphatase: 117 U/L (ref 38–126)
Anion gap: 14 (ref 5–15)
BUN: 58 mg/dL — ABNORMAL HIGH (ref 6–20)
CO2: 25 mmol/L (ref 22–32)
Calcium: 9 mg/dL (ref 8.9–10.3)
Chloride: 100 mmol/L (ref 98–111)
Creatinine, Ser: 9.37 mg/dL — ABNORMAL HIGH (ref 0.44–1.00)
GFR, Estimated: 4 mL/min — ABNORMAL LOW (ref >60)
Glucose, Bld: 104 mg/dL — ABNORMAL HIGH (ref 70–99)
Potassium: 3.7 mmol/L (ref 3.5–5.1)
Sodium: 139 mmol/L (ref 135–145)
Total Bilirubin: 0.6 mg/dL (ref 0.3–1.2)
Total Protein: 7.6 g/dL (ref 6.5–8.1)

## 2021-05-17 LAB — CBC WITH DIFFERENTIAL/PLATELET
Abs Immature Granulocytes: 0.03 10*3/uL (ref 0.00–0.07)
Basophils Absolute: 0.1 10*3/uL (ref 0.0–0.1)
Basophils Relative: 1 %
Eosinophils Absolute: 0.2 10*3/uL (ref 0.0–0.5)
Eosinophils Relative: 3 %
HCT: 36.6 % (ref 36.0–46.0)
Hemoglobin: 11.6 g/dL — ABNORMAL LOW (ref 12.0–15.0)
Immature Granulocytes: 0 %
Lymphocytes Relative: 19 %
Lymphs Abs: 1.6 10*3/uL (ref 0.7–4.0)
MCH: 33 pg (ref 26.0–34.0)
MCHC: 31.7 g/dL (ref 30.0–36.0)
MCV: 104 fL — ABNORMAL HIGH (ref 80.0–100.0)
Monocytes Absolute: 1 10*3/uL (ref 0.1–1.0)
Monocytes Relative: 12 %
Neutro Abs: 5.2 10*3/uL (ref 1.7–7.7)
Neutrophils Relative %: 65 %
Platelets: 229 10*3/uL (ref 150–400)
RBC: 3.52 MIL/uL — ABNORMAL LOW (ref 3.87–5.11)
RDW: 15 % (ref 11.5–15.5)
WBC: 8.1 10*3/uL (ref 4.0–10.5)
nRBC: 0 % (ref 0.0–0.2)

## 2021-05-18 DIAGNOSIS — D631 Anemia in chronic kidney disease: Secondary | ICD-10-CM | POA: Diagnosis not present

## 2021-05-18 DIAGNOSIS — E1129 Type 2 diabetes mellitus with other diabetic kidney complication: Secondary | ICD-10-CM | POA: Diagnosis not present

## 2021-05-18 DIAGNOSIS — E119 Type 2 diabetes mellitus without complications: Secondary | ICD-10-CM | POA: Diagnosis not present

## 2021-05-18 DIAGNOSIS — N186 End stage renal disease: Secondary | ICD-10-CM | POA: Diagnosis not present

## 2021-05-18 DIAGNOSIS — Z992 Dependence on renal dialysis: Secondary | ICD-10-CM | POA: Diagnosis not present

## 2021-05-18 DIAGNOSIS — N2581 Secondary hyperparathyroidism of renal origin: Secondary | ICD-10-CM | POA: Diagnosis not present

## 2021-05-20 DIAGNOSIS — N186 End stage renal disease: Secondary | ICD-10-CM | POA: Diagnosis not present

## 2021-05-20 DIAGNOSIS — Z992 Dependence on renal dialysis: Secondary | ICD-10-CM | POA: Diagnosis not present

## 2021-05-20 DIAGNOSIS — E119 Type 2 diabetes mellitus without complications: Secondary | ICD-10-CM | POA: Diagnosis not present

## 2021-05-20 DIAGNOSIS — N2581 Secondary hyperparathyroidism of renal origin: Secondary | ICD-10-CM | POA: Diagnosis not present

## 2021-05-20 DIAGNOSIS — D631 Anemia in chronic kidney disease: Secondary | ICD-10-CM | POA: Diagnosis not present

## 2021-05-20 DIAGNOSIS — E1129 Type 2 diabetes mellitus with other diabetic kidney complication: Secondary | ICD-10-CM | POA: Diagnosis not present

## 2021-05-21 ENCOUNTER — Other Ambulatory Visit (HOSPITAL_COMMUNITY): Payer: Self-pay | Admitting: Hematology

## 2021-05-21 DIAGNOSIS — Z170 Estrogen receptor positive status [ER+]: Secondary | ICD-10-CM

## 2021-05-21 DIAGNOSIS — C50211 Malignant neoplasm of upper-inner quadrant of right female breast: Secondary | ICD-10-CM

## 2021-05-23 ENCOUNTER — Ambulatory Visit (HOSPITAL_COMMUNITY): Payer: Medicare Other | Admitting: Hematology

## 2021-05-23 ENCOUNTER — Encounter (HOSPITAL_COMMUNITY): Payer: Self-pay | Admitting: Hematology

## 2021-05-23 DIAGNOSIS — N2581 Secondary hyperparathyroidism of renal origin: Secondary | ICD-10-CM | POA: Diagnosis not present

## 2021-05-23 DIAGNOSIS — Z992 Dependence on renal dialysis: Secondary | ICD-10-CM | POA: Diagnosis not present

## 2021-05-23 DIAGNOSIS — E119 Type 2 diabetes mellitus without complications: Secondary | ICD-10-CM | POA: Diagnosis not present

## 2021-05-23 DIAGNOSIS — N186 End stage renal disease: Secondary | ICD-10-CM | POA: Diagnosis not present

## 2021-05-23 DIAGNOSIS — E1129 Type 2 diabetes mellitus with other diabetic kidney complication: Secondary | ICD-10-CM | POA: Diagnosis not present

## 2021-05-23 DIAGNOSIS — D631 Anemia in chronic kidney disease: Secondary | ICD-10-CM | POA: Diagnosis not present

## 2021-05-23 NOTE — Progress Notes (Signed)
[place] [address], [place] [address]   Patient Care Team: [name], MD as PCP - General (Family Medicine) [name], [name], MD as PCP - Cardiology (Cardiology) [name] [name], MD (Gastroenterology) [name], MD as Consulting Physician (Cardiology) [name], RN as Oncology Nurse Navigator (Oncology)  SUMMARY OF ONCOLOGIC HISTORY: Oncology History  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[place])  09/18/2018 Initial Diagnosis   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[place])   09/26/2018 Cancer Staging   Staging form: Breast, AJCC 8th Edition - Clinical stage from 09/26/2018: Stage IB (cT1c, cN0, cM0, G3, ER+, PR-, HER2-) - Signed by Derek Jack, MD on 09/26/2018   10/30/2018 -  Chemotherapy   The patient had DOXOrubicin (ADRIAMYCIN) chemo injection 108 mg, 60 mg/m2 = 108 mg, Intravenous,  Once, 4 of 4 cycles Administration: 108 mg (10/30/2018), 108 mg (11/20/2018), 108 mg (12/11/2018), 108 mg (01/01/2019) palonosetron (ALOXI) injection 0.25 mg, 0.25 mg, Intravenous,  Once, 4 of 4 cycles Administration: 0.25 mg (10/30/2018), 0.25 mg (11/20/2018), 0.25 mg (12/11/2018), 0.25 mg (01/01/2019) pegfilgrastim (NEULASTA ONPRO KIT) injection 6 mg, 6 mg, Subcutaneous, Once, 4 of 4 cycles Administration: 6 mg (10/30/2018), 6 mg (11/20/2018), 6 mg (12/11/2018), 6 mg (01/01/2019) cyclophosphamide (CYTOXAN) 820 mg in sodium chloride 0.9 % 250 mL chemo infusion, 450 mg/m2 = 820 mg (75 % of original dose 600 mg/m2), Intravenous,  Once, 4 of 4 cycles Dose modification: 450 mg/m2 (75 % of original dose 600 mg/m2, Cycle 1, Reason: Other (see comments), Comment: hemodialysis) Administration: 820 mg (10/30/2018), 820 mg (11/20/2018), 820 mg (12/11/2018), 820 mg (01/01/2019) fosaprepitant (EMEND) 150 mg, dexamethasone (DECADRON) 12 mg in sodium chloride 0.9 % 145 mL IVPB, , Intravenous,  Once, 4 of 4  cycles Administration:  (10/30/2018),  (11/20/2018),  (12/11/2018),  (01/01/2019)   for chemotherapy treatment.       CHIEF COMPLIANT: Follow-up of right breast cancer   INTERVAL HISTORY: Megan [name] is a 60 y.o. female here today for follow up of her right breast cancer. Her last visit was on 11/10/2020.   Today she reports feeling well. She denies diarrhea. She reports bilateral chest pain at the site for her right partial mastectomy and left simple mastectomy. She irritation and reddening of the skin to these sites following radiation.    REVIEW OF SYSTEMS:   Review of Systems  Constitutional:  Negative for appetite change (80%) and fatigue (50%).  Cardiovascular:  Positive for chest pain (bilateral).  Neurological:  Positive for dizziness, headaches and numbness (tingling in feet).  All other systems reviewed and are negative.  I have reviewed the past medical history, past surgical history, social history and family history with the patient and they are unchanged from previous note.   ALLERGIES:   has No Known Allergies.   MEDICATIONS:  Current Outpatient Medications  Medication Sig Dispense Refill   acetaminophen (TYLENOL) 500 MG tablet Take by mouth.     amLODipine (NORVASC) 10 MG tablet TAKE 1 TABLET BY MOUTH AT BEDTIME 90 tablet 3   anastrozole (ARIMIDEX) 1 MG tablet TAKE 1 TABLET(1 MG) BY MOUTH DAILY 30 tablet 4   aspirin EC 81 MG tablet Take 81 mg by mouth daily.     atorvastatin (LIPITOR) 20 MG tablet Take 1 tablet (20 mg total) by mouth daily. 90 tablet 3   B Complex-C-Folic Acid (DIALYVITE 191) 0.8 MG TABS Take 1 tablet by mouth  daily.     cinacalcet (SENSIPAR) 30 MG tablet Take 2 tablets (60 mg total) by mouth daily. 90 tablet 0   DULoxetine (CYMBALTA) 30 MG capsule TAKE 1 CAPSULE(30 MG) BY MOUTH DAILY 90 capsule 1   gabapentin (NEURONTIN) 300 MG capsule TAKE 1 CAPSULE(300 MG) BY MOUTH AT BEDTIME 90 capsule 1   lanthanum (FOSRENOL) 1000 MG chewable  tablet Chew 2,000-3,000 mg by mouth See admin instructions. Take 3 tablets (3000 mg) by mouth with meals and take 2 tablets (2000 mg) with snacks     lidocaine-prilocaine (EMLA) cream Apply 1 application topically every Monday, Wednesday, and Friday with hemodialysis.     nebivolol (BYSTOLIC) 10 MG tablet TAKE 1 TABLET IN THE MORNING AND AT BEDTIME 180 tablet 3   pantoprazole (PROTONIX) 40 MG tablet TAKE 1 TABLET(40 MG) BY MOUTH DAILY 90 tablet 3   meclizine (ANTIVERT) 25 MG tablet Take 1 tablet (25 mg total) by mouth 3 (three) times daily as needed for dizziness. (Patient not taking: Reported on 05/24/2021) 30 tablet 0   No current facility-administered medications for this visit.     PHYSICAL EXAMINATION: Performance status (ECOG): 1 - Symptomatic but completely ambulatory  Vitals:   05/24/21 1355  BP: (!) 149/76  Pulse: 95  Resp: 18  Temp: (!) 96.8 F (36 C)  SpO2: 99%   Wt Readings from Last 3 Encounters:  05/24/21 176 lb 12.8 oz (80.2 kg)  05/11/21 174 lb 3.2 oz (79 kg)  05/03/21 173 lb 6.4 oz (78.7 kg)   Physical Exam Vitals reviewed.  Constitutional:      Appearance: Normal appearance.  Cardiovascular:     Rate and Rhythm: Normal rate and regular rhythm.     Pulses: Normal pulses.     Heart sounds: Normal heart sounds.  Pulmonary:     Effort: Pulmonary effort is normal.     Breath sounds: Normal breath sounds.  Chest:  Breasts:    Right: Tenderness present. No mass.     Left: Absent. No mass.  Neurological:     General: No focal deficit present.     Mental Status: She is alert and oriented to person, place, and time.  Psychiatric:        Mood and Affect: Mood normal.        Behavior: Behavior normal.    Breast Exam Chaperone: Thana Ates     LABORATORY DATA:  I have reviewed the data as listed CMP Latest Ref Rng & Units 05/17/2021 03/06/2021 02/21/2021  Glucose 70 - 99 mg/dL 104(H) 118(H) 127(H)  BUN 6 - 20 mg/dL 58(H) 47(H) 76(H)  Creatinine 0.44 -  1.00 mg/dL 9.37(H) 9.99(H) 12.12(H)  Sodium 135 - 145 mmol/L 139 135 132(L)  Potassium 3.5 - 5.1 mmol/L 3.7 3.8 3.5  Chloride 98 - 111 mmol/L 100 98 101  CO2 22 - 32 mmol/L 25 25 20(L)  Calcium 8.9 - 10.3 mg/dL 9.0 9.0 8.3(L)  Total Protein 6.5 - 8.1 g/dL 7.6 6.6 -  Total Bilirubin 0.3 - 1.2 mg/dL 0.6 0.7 -  Alkaline Phos 38 - 126 U/L 117 158(H) -  AST 15 - 41 U/L 17 21 -  ALT 0 - 44 U/L 21 34 -   No results found for: XTG626 Lab Results  Component Value Date   WBC 8.1 05/17/2021   HGB 11.6 (L) 05/17/2021   HCT 36.6 05/17/2021   MCV 104.0 (H) 05/17/2021   PLT 229 05/17/2021   NEUTROABS 5.2 05/17/2021    ASSESSMENT:  1.  Stage Ib (T1CN0) right breast cancer, ER positive, PR and HER-2 negative: -Right lumpectomy and sentinel lymph node biopsy on 09/18/2018 shows 1.5 cm IDC, grade 3, associated with high-grade DCIS, negative margins, 0/3 lymph nodes positive, ER 50%, PR negative and HER-2 negative, Ki-67 60%. -Oncotype DX recurrence score 47.  Distant recurrence at 9 years with tamoxifen alone is 36%.  Absolute chemotherapy benefit is more than 15%. -Adjuvant chemotherapy with 4 cycles of AC from 10/30/2018 through 01/01/2019. -She was started on anastrozole in June 2020. -Mammogram on 09/02/2019 showed calcifications in the posterior aspect of upper outer quadrant of the right breast. -Right mastectomy on 02/18/2020 shows high-grade DCIS, 1.5 cm, no invasive carcinoma, resection margins negative.  0/11 lymph nodes.  ER 30% positive, PR negative. -Left simple mastectomy on 06/15/2020 was benign.  Right breast biopsy shows microscopic focus of invasive ductal carcinoma in the background of extensive high-grade DCIS. -Right breast lumpectomy on 07/13/2020 shows multifocal invasive ductal carcinoma, grade 3, largest measuring 1.2 cm.  Extensive high-grade DCIS with necrosis.  Margins are free.  PT1CNX.  There are multiple foci of invasive carcinoma arising from extensive DCIS.  Several small foci  of invasive tumor arising from DCIS indicating new primaries.  ER/PR/HER-2 is negative.  Ki-67 is 20%. -PET scan on 09/14/2020 shows areas of nodularity with discrete nodule in the fat of the inferior chest wall within the subcutaneous tissues. -Ultrasound of the right chest wall shows masslike abnormality in the upper inner aspect of the right anterior chest at 1 o'clock position measuring 4.1 cm in greatest dimension.  1.7 cm hypoechoic mass in the central anterior right chest concerning for malignancy.  Possible borderline enlarged residual right axillary lymph node. -Right breast biopsy on 10/12/2020 shows 1.3 cm invasive ductal carcinoma, focally 0.1 cm from superior margin.  DCIS is less than 0.1 cm from superior margin.  PT1CPNX.   2.  Osteopenia: -Bone density on 01/20/2019 shows T score -1.9.   PLAN:  1.  Recurrent right breast cancer, triple receptor negative: - Continue anastrozole daily as her initial breast cancer was estrogen positive. - She completed radiation to the right chest wall in May. - She still has some hyperpigmentation. - Examination of the chest wall has a tender area at the right lymph node biopsy site with no palpable mass.  There is also tenderness along the suture line at the left mastectomy site with mild keloid formation.  No palpable suspicious masses bilaterally.  No palpable adenopathy. - Reviewed labs from 05/17/2021 which showed normal LFTs.  Calcium was normal.  CBC was grossly normal. - RTC 3 months with physical exam and labs.     2.  Osteopenia: - DEXA scan on 03/24/2021 with T score -2.4. - She is receiving vitamin D at the time of dialysis. - She will continue calcium supplements.  We will check vitamin D level at next visit.   3.  ESRD on HD: - HD on Monday, Wednesday and Friday under the direction of Dr. Marval Regal.   4.  Neuropathy: -She has burning sensation in the bottom of the feet. - Continue gabapentin at bedtime.   Breast Cancer therapy  associated bone loss: I have recommended calcium, Vitamin D and weight bearing exercises.  Orders placed this encounter:  No orders of the defined types were placed in this encounter.   The patient has a good understanding of the overall plan. She agrees with it. She will call with any problems that may develop before  the next visit here.  Derek Jack, MD [place] [phone]   I, Thana Ates, am acting as a scribe for Dr. Derek Jack.  I, Derek Jack MD, have reviewed the above documentation for accuracy and completeness, and I agree with the above.

## 2021-05-24 ENCOUNTER — Inpatient Hospital Stay (HOSPITAL_BASED_OUTPATIENT_CLINIC_OR_DEPARTMENT_OTHER): Payer: Medicare Other | Admitting: Hematology

## 2021-05-24 ENCOUNTER — Other Ambulatory Visit: Payer: Self-pay

## 2021-05-24 VITALS — BP 149/76 | HR 95 | Temp 96.8°F | Resp 18 | Wt 176.8 lb

## 2021-05-24 DIAGNOSIS — M8580 Other specified disorders of bone density and structure, unspecified site: Secondary | ICD-10-CM | POA: Diagnosis not present

## 2021-05-24 DIAGNOSIS — C50211 Malignant neoplasm of upper-inner quadrant of right female breast: Secondary | ICD-10-CM

## 2021-05-24 DIAGNOSIS — Z170 Estrogen receptor positive status [ER+]: Secondary | ICD-10-CM

## 2021-05-24 DIAGNOSIS — Z992 Dependence on renal dialysis: Secondary | ICD-10-CM | POA: Diagnosis not present

## 2021-05-24 DIAGNOSIS — N186 End stage renal disease: Secondary | ICD-10-CM | POA: Diagnosis not present

## 2021-05-24 DIAGNOSIS — G629 Polyneuropathy, unspecified: Secondary | ICD-10-CM | POA: Diagnosis not present

## 2021-05-24 DIAGNOSIS — E559 Vitamin D deficiency, unspecified: Secondary | ICD-10-CM | POA: Diagnosis not present

## 2021-05-24 NOTE — Patient Instructions (Signed)
[facility] at [facility] Discharge Instructions  You were seen and examined today by Dr. Katragadda. Please follow up as scheduled.    Thank you for choosing [facility] at [facility] to provide your oncology and hematology care.  To afford each patient quality time with our provider, please arrive at least 15 minutes before your scheduled appointment time.   If you have a lab appointment with the Cancer Center please come in thru the Main Entrance and check in at the main information desk.  You need to re-schedule your appointment should you arrive 10 or more minutes late.  We strive to give you quality time with our providers, and arriving late affects you and other patients whose appointments are after yours.  Also, if you no show three or more times for appointments you may be dismissed from the clinic at the providers discretion.     Again, thank you for choosing [facility].  Our hope is that these requests will decrease the amount of time that you wait before being seen by our physicians.       _____________________________________________________________  Should you have questions after your visit to [facility], please contact our office at [phone] and follow the prompts.  Our office hours are 8:00 a.m. and 4:30 p.m. Monday - Friday.  Please note that voicemails left after 4:00 p.m. may not be returned until the following business day.  We are closed weekends and major holidays.  You do have access to a nurse 24-7, just call the main number to the clinic [phone] and do not press any options, hold on the line and a nurse will answer the phone.    For prescription refill requests, have your pharmacy contact our office and allow 72 hours.    Due to Covid, you will need to wear a mask upon entering the hospital. If you do not have a mask, a mask will be given to you at the Main Entrance upon arrival. For  doctor visits, patients may have 1 support person age 18 or older with them. For treatment visits, patients can not have anyone with them due to social distancing guidelines and our immunocompromised population.      

## 2021-05-25 ENCOUNTER — Encounter: Payer: Self-pay | Admitting: *Deleted

## 2021-05-25 DIAGNOSIS — N186 End stage renal disease: Secondary | ICD-10-CM | POA: Diagnosis not present

## 2021-05-25 DIAGNOSIS — E119 Type 2 diabetes mellitus without complications: Secondary | ICD-10-CM | POA: Diagnosis not present

## 2021-05-25 DIAGNOSIS — N2581 Secondary hyperparathyroidism of renal origin: Secondary | ICD-10-CM | POA: Diagnosis not present

## 2021-05-25 DIAGNOSIS — D631 Anemia in chronic kidney disease: Secondary | ICD-10-CM | POA: Diagnosis not present

## 2021-05-25 DIAGNOSIS — E1129 Type 2 diabetes mellitus with other diabetic kidney complication: Secondary | ICD-10-CM | POA: Diagnosis not present

## 2021-05-25 DIAGNOSIS — Z992 Dependence on renal dialysis: Secondary | ICD-10-CM | POA: Diagnosis not present

## 2021-05-25 MED ORDER — PEG 3350-KCL-NA BICARB-NACL 420 G PO SOLR: ORAL | 0 refills | Status: DC

## 2021-05-25 NOTE — Telephone Encounter (Signed)
Called made pt aware her pre-op will be done via telephone on 10/31.

## 2021-05-25 NOTE — Addendum Note (Signed)
Addended by: Cheron Every on: 05/25/2021 02:04 PM   Modules accepted: Orders

## 2021-05-25 NOTE — Telephone Encounter (Signed)
CALLED PT. She has been scheduled for 11/3. Aware will have pre-op appointment and she will be informed of arrival time. Advised will send prep instructions to her int he mail and will send Rx to pharmacy. She voiced understanding.

## 2021-05-27 DIAGNOSIS — E119 Type 2 diabetes mellitus without complications: Secondary | ICD-10-CM | POA: Diagnosis not present

## 2021-05-27 DIAGNOSIS — N186 End stage renal disease: Secondary | ICD-10-CM | POA: Diagnosis not present

## 2021-05-27 DIAGNOSIS — D631 Anemia in chronic kidney disease: Secondary | ICD-10-CM | POA: Diagnosis not present

## 2021-05-27 DIAGNOSIS — Z992 Dependence on renal dialysis: Secondary | ICD-10-CM | POA: Diagnosis not present

## 2021-05-27 DIAGNOSIS — N2581 Secondary hyperparathyroidism of renal origin: Secondary | ICD-10-CM | POA: Diagnosis not present

## 2021-05-27 DIAGNOSIS — E1129 Type 2 diabetes mellitus with other diabetic kidney complication: Secondary | ICD-10-CM | POA: Diagnosis not present

## 2021-05-30 DIAGNOSIS — D631 Anemia in chronic kidney disease: Secondary | ICD-10-CM | POA: Diagnosis not present

## 2021-05-30 DIAGNOSIS — N2581 Secondary hyperparathyroidism of renal origin: Secondary | ICD-10-CM | POA: Diagnosis not present

## 2021-05-30 DIAGNOSIS — N186 End stage renal disease: Secondary | ICD-10-CM | POA: Diagnosis not present

## 2021-05-30 DIAGNOSIS — Z992 Dependence on renal dialysis: Secondary | ICD-10-CM | POA: Diagnosis not present

## 2021-05-30 DIAGNOSIS — E119 Type 2 diabetes mellitus without complications: Secondary | ICD-10-CM | POA: Diagnosis not present

## 2021-05-30 DIAGNOSIS — E1129 Type 2 diabetes mellitus with other diabetic kidney complication: Secondary | ICD-10-CM | POA: Diagnosis not present

## 2021-05-31 DIAGNOSIS — E119 Type 2 diabetes mellitus without complications: Secondary | ICD-10-CM | POA: Diagnosis not present

## 2021-05-31 DIAGNOSIS — N2581 Secondary hyperparathyroidism of renal origin: Secondary | ICD-10-CM | POA: Diagnosis not present

## 2021-05-31 DIAGNOSIS — N186 End stage renal disease: Secondary | ICD-10-CM | POA: Diagnosis not present

## 2021-05-31 DIAGNOSIS — Z992 Dependence on renal dialysis: Secondary | ICD-10-CM | POA: Diagnosis not present

## 2021-05-31 DIAGNOSIS — D631 Anemia in chronic kidney disease: Secondary | ICD-10-CM | POA: Diagnosis not present

## 2021-05-31 DIAGNOSIS — E1129 Type 2 diabetes mellitus with other diabetic kidney complication: Secondary | ICD-10-CM | POA: Diagnosis not present

## 2021-06-01 ENCOUNTER — Telehealth: Payer: Self-pay | Admitting: *Deleted

## 2021-06-01 DIAGNOSIS — E039 Hypothyroidism, unspecified: Secondary | ICD-10-CM | POA: Diagnosis not present

## 2021-06-01 DIAGNOSIS — Z992 Dependence on renal dialysis: Secondary | ICD-10-CM | POA: Diagnosis not present

## 2021-06-01 DIAGNOSIS — D631 Anemia in chronic kidney disease: Secondary | ICD-10-CM | POA: Diagnosis not present

## 2021-06-01 DIAGNOSIS — N2581 Secondary hyperparathyroidism of renal origin: Secondary | ICD-10-CM | POA: Diagnosis not present

## 2021-06-01 DIAGNOSIS — N186 End stage renal disease: Secondary | ICD-10-CM | POA: Diagnosis not present

## 2021-06-01 DIAGNOSIS — E119 Type 2 diabetes mellitus without complications: Secondary | ICD-10-CM | POA: Diagnosis not present

## 2021-06-01 DIAGNOSIS — E1129 Type 2 diabetes mellitus with other diabetic kidney complication: Secondary | ICD-10-CM | POA: Diagnosis not present

## 2021-06-01 NOTE — Chronic Care Management (AMB) (Signed)
  Chronic Care Management   Note  06/01/2021 Name: Megan [name] MRN: [id number] DOB: 06-13-1961  Megan [name] is a 60 y.o. year old female who is a primary care patient of [name], MD. I reached out to [name] by phone today in response to a referral sent by Megan [name]'s PCP.  Megan [name] was given information about Chronic Care Management services today including:  CCM service includes personalized support from designated clinical staff supervised by her physician, including individualized plan of care and coordination with other care providers 24/7 contact phone numbers for assistance for urgent and routine care needs. Service will only be billed when office clinical staff spend 20 minutes or more in a month to coordinate care. Only one practitioner may furnish and bill the service in a calendar month. The patient may stop CCM services at any time (effective at the end of the month) by phone call to the office staff. The patient is responsible for co-pay (up to 20% after annual deductible is met) if co-pay is required by the individual health plan.   Patient did not agree to enrollment in care management services and does not wish to consider at this time.  Follow up plan: Patient declines engagement by the care management team. Appropriate care team members and provider have been notified via electronic communication.  The care management team is available to follow up with the patient after provider conversation with the patient regarding recommendation for care management engagement and subsequent re-referral to the care management team.   [facility]  Direct Dial: [phone]

## 2021-06-02 ENCOUNTER — Encounter (HOSPITAL_COMMUNITY): Payer: Self-pay | Admitting: Emergency Medicine

## 2021-06-02 ENCOUNTER — Emergency Department (HOSPITAL_COMMUNITY)
Admission: EM | Admit: 2021-06-02 | Discharge: 2021-06-02 | Disposition: A | Discharge status: Home / Self Care | Payer: Medicare Other | Attending: Emergency Medicine | Admitting: Emergency Medicine

## 2021-06-02 ENCOUNTER — Other Ambulatory Visit: Payer: Self-pay

## 2021-06-02 ENCOUNTER — Emergency Department (HOSPITAL_COMMUNITY): Payer: Medicare Other

## 2021-06-02 DIAGNOSIS — I132 Hypertensive heart and chronic kidney disease with heart failure and with stage 5 chronic kidney disease, or end stage renal disease: Secondary | ICD-10-CM | POA: Insufficient documentation

## 2021-06-02 DIAGNOSIS — Z7982 Long term (current) use of aspirin: Secondary | ICD-10-CM | POA: Insufficient documentation

## 2021-06-02 DIAGNOSIS — K429 Umbilical hernia without obstruction or gangrene: Secondary | ICD-10-CM | POA: Diagnosis not present

## 2021-06-02 DIAGNOSIS — Z992 Dependence on renal dialysis: Secondary | ICD-10-CM | POA: Diagnosis not present

## 2021-06-02 DIAGNOSIS — N186 End stage renal disease: Secondary | ICD-10-CM | POA: Diagnosis not present

## 2021-06-02 DIAGNOSIS — I5031 Acute diastolic (congestive) heart failure: Secondary | ICD-10-CM | POA: Diagnosis not present

## 2021-06-02 DIAGNOSIS — N281 Cyst of kidney, acquired: Secondary | ICD-10-CM | POA: Insufficient documentation

## 2021-06-02 DIAGNOSIS — N261 Atrophy of kidney (terminal): Secondary | ICD-10-CM | POA: Diagnosis not present

## 2021-06-02 DIAGNOSIS — N200 Calculus of kidney: Secondary | ICD-10-CM | POA: Diagnosis not present

## 2021-06-02 DIAGNOSIS — E1122 Type 2 diabetes mellitus with diabetic chronic kidney disease: Secondary | ICD-10-CM | POA: Diagnosis not present

## 2021-06-02 DIAGNOSIS — Z79899 Other long term (current) drug therapy: Secondary | ICD-10-CM | POA: Insufficient documentation

## 2021-06-02 DIAGNOSIS — E039 Hypothyroidism, unspecified: Secondary | ICD-10-CM | POA: Diagnosis not present

## 2021-06-02 DIAGNOSIS — Z853 Personal history of malignant neoplasm of breast: Secondary | ICD-10-CM | POA: Insufficient documentation

## 2021-06-02 DIAGNOSIS — I700 Atherosclerosis of aorta: Secondary | ICD-10-CM | POA: Diagnosis not present

## 2021-06-02 DIAGNOSIS — R109 Unspecified abdominal pain: Secondary | ICD-10-CM | POA: Diagnosis not present

## 2021-06-02 LAB — CBC WITH DIFFERENTIAL/PLATELET
Abs Immature Granulocytes: 0.04 10*3/uL (ref 0.00–0.07)
Basophils Absolute: 0.1 10*3/uL (ref 0.0–0.1)
Basophils Relative: 1 %
Eosinophils Absolute: 0.2 10*3/uL (ref 0.0–0.5)
Eosinophils Relative: 3 %
HCT: 36.4 % (ref 36.0–46.0)
Hemoglobin: 11.5 g/dL — ABNORMAL LOW (ref 12.0–15.0)
Immature Granulocytes: 0 %
Lymphocytes Relative: 16 %
Lymphs Abs: 1.5 10*3/uL (ref 0.7–4.0)
MCH: 33 pg (ref 26.0–34.0)
MCHC: 31.6 g/dL (ref 30.0–36.0)
MCV: 104.6 fL — ABNORMAL HIGH (ref 80.0–100.0)
Monocytes Absolute: 1.1 10*3/uL — ABNORMAL HIGH (ref 0.1–1.0)
Monocytes Relative: 11 %
Neutro Abs: 6.4 10*3/uL (ref 1.7–7.7)
Neutrophils Relative %: 69 %
Platelets: 252 10*3/uL (ref 150–400)
RBC: 3.48 MIL/uL — ABNORMAL LOW (ref 3.87–5.11)
RDW: 15.7 % — ABNORMAL HIGH (ref 11.5–15.5)
WBC: 9.3 10*3/uL (ref 4.0–10.5)
nRBC: 0 % (ref 0.0–0.2)

## 2021-06-02 LAB — COMPREHENSIVE METABOLIC PANEL
ALT: 23 U/L (ref 0–44)
AST: 19 U/L (ref 15–41)
Albumin: 3.7 g/dL (ref 3.5–5.0)
Alkaline Phosphatase: 109 U/L (ref 38–126)
Anion gap: 13 (ref 5–15)
BUN: 38 mg/dL — ABNORMAL HIGH (ref 6–20)
CO2: 28 mmol/L (ref 22–32)
Calcium: 9.8 mg/dL (ref 8.9–10.3)
Chloride: 97 mmol/L — ABNORMAL LOW (ref 98–111)
Creatinine, Ser: 7.81 mg/dL — ABNORMAL HIGH (ref 0.44–1.00)
GFR, Estimated: 6 mL/min — ABNORMAL LOW (ref >60)
Glucose, Bld: 99 mg/dL (ref 70–99)
Potassium: 3.5 mmol/L (ref 3.5–5.1)
Sodium: 138 mmol/L (ref 135–145)
Total Bilirubin: 0.5 mg/dL (ref 0.3–1.2)
Total Protein: 7.8 g/dL (ref 6.5–8.1)

## 2021-06-02 LAB — URINALYSIS, ROUTINE W REFLEX MICROSCOPIC
Bilirubin Urine: NEGATIVE
Glucose, UA: NEGATIVE mg/dL
Ketones, ur: NEGATIVE mg/dL
Nitrite: NEGATIVE
Protein, ur: 100 mg/dL — AB
Specific Gravity, Urine: 1.012 (ref 1.005–1.030)
pH: 7 (ref 5.0–8.0)

## 2021-06-02 LAB — LIPASE, BLOOD: Lipase: 37 U/L (ref 11–51)

## 2021-06-02 MED ORDER — MORPHINE SULFATE (PF) 2 MG/ML IV SOLN
2.0000 mg | Freq: Once | INTRAVENOUS | Status: AC
Start: 2021-06-02 — End: 2021-06-02
  Administered 2021-06-02: 2 mg via INTRAVENOUS
  Filled 2021-06-02: qty 1

## 2021-06-02 MED ORDER — SODIUM CHLORIDE 0.9 % IV BOLUS
250.0000 mL | Freq: Once | INTRAVENOUS | Status: AC
  Administered 2021-06-02: 250 mL via INTRAVENOUS

## 2021-06-02 MED ORDER — HYDROCODONE-ACETAMINOPHEN 5-325 MG PO TABS: 1.0000 | ORAL_TABLET | Freq: Four times a day (QID) | ORAL | 0 refills | Status: DC | PRN

## 2021-06-02 MED ORDER — MORPHINE SULFATE (PF) 2 MG/ML IV SOLN
2.0000 mg | Freq: Once | INTRAVENOUS | Status: AC
  Administered 2021-06-02: 2 mg via INTRAVENOUS
  Filled 2021-06-02: qty 1

## 2021-06-02 MED ORDER — ONDANSETRON HCL 4 MG/2ML IJ SOLN
4.0000 mg | Freq: Once | INTRAMUSCULAR | Status: AC
  Administered 2021-06-02: 4 mg via INTRAVENOUS
  Filled 2021-06-02: qty 2

## 2021-06-02 NOTE — ED Provider Notes (Signed)
[facility] EMERGENCY DEPARTMENT Provider Note   CSN: [id number] Arrival date & time: 06/02/21  4196     History Chief Complaint  Patient presents with   Flank Pain    Megan [name] is a 60 y.o. female with a history significant for breast cancer under the care of Dr. Delton Coombes, end-stage renal disease on dialysis, hypertension, GERD, hyperlipidemia and type 2 diabetes presenting with a 1 day history of left sided flank pain.  She has undergone dialysis the last 3 days in a row secondary to fluid overload, after yesterday's dialysis session she developed gradual onset of sharp pain in her left flank region.  Her pain is been intermittent and then became severe last night when it woke around 2 AM.  She endorses nausea without emesis.  She has had no fevers or chills, denies bowel or bladder changes.  She does make urine, denies dysuria or hematuria.  She denies history of kidney stones.  She has found no alleviators for her symptoms.  The history is provided by the patient.      Past Medical History:  Diagnosis Date   (HFpEF) heart failure with preserved ejection fraction ([place])    a. 01/2019 Echo: EF 55-60%, mild conc LVH. DD.  Torn MV chordae.   Anemia    Atypical chest pain    a. 08/2018 MV: EF 59%, no ischemia; b. 02/2019 Cath: nonobs dzs.   Blood transfusion without reported diagnosis    Breast cancer ([place]) 10/12/2020   Cataract    ESRD (end stage renal disease) on dialysis [facility])    a. HD T, T, S   Essential hypertension, benign    GERD (gastroesophageal reflux disease)    Headache    Hemorrhoids    Mixed hyperlipidemia    Morbid obesity (HCC)    Non-obstructive CAD (coronary artery disease)    a. 02/2019 CathL LM nl, LAD 17m, LCX nl, RCA 25p, EF 55-65%.   PONV (postoperative nausea and vomiting)    S/P colonoscopy Jan 2011   Dr. Benson Norway: sessile polyp (benign lymphoid), large hemorrhoids, repeat 5-10 years   Temporal arteritis ([place])    Type 2 diabetes mellitus ([place])     Wears glasses     Patient Active Problem List   Diagnosis Date Noted   LUQ pain 05/11/2021   Fatigue 05/03/2021   C. difficile colitis 02/15/2021   Dizziness    Breast mass, right    History of right breast cancer    Lobar pneumonia, unspecified organism ([place]) 07/03/2020   Pleural effusion, right    S/P left mastectomy 06/15/2020   Allergy, unspecified, initial encounter 03/03/2020   Anaphylactic shock, unspecified, initial encounter 03/03/2020   Breast cancer ([place]) 02/18/2020   History of modified radical mastectomy of right breast 02/18/2020   S/P mastectomy, right 02/18/2020   ESRD (end stage renal disease) on dialysis ([place])    Grade III hemorrhoids 07/31/2019   Fluid overload, unspecified 05/24/2019   Anaphylactic reaction due to adverse effect of correct drug or medicament properly administered, initial encounter 04/30/2019   Unspecified protein-calorie malnutrition ([place]) 03/03/2019   Hypertensive heart disease with heart failure ([place]) 03/01/2019   Atherosclerotic heart disease of native coronary artery without angina pectoris 03/01/2019   Chest pain 02/28/2019   Chest pain with high risk for cardiac etiology 02/28/2019   Elevated troponin 02/03/2019   ESRD (end stage renal disease) ([place]) 22/29/7989   Acute diastolic CHF (congestive heart failure) ([place]) 02/03/2019   Hypokalemia 02/03/2019  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[place])    Hypocalcemia 09/03/2018   Hypothyroidism, unspecified 09/21/2017   Anemia in chronic kidney disease 04/12/2017   Iron deficiency anemia, unspecified 04/12/2017   Abnormal results of kidney function studies 04/11/2017   Acidosis 04/11/2017   Anemia, unspecified 04/11/2017   Coagulation defect, unspecified ([place]) 04/11/2017   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, with stage 5 chronic kidney disease, or end stage renal disease ([place]) 04/11/2017   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 04/11/2017   Other disorders of phosphorus metabolism 04/11/2017   Pain, unspecified 04/11/2017   Proteinuria, unspecified 04/11/2017   Pruritus, unspecified 04/11/2017   Unspecified kidney failure 04/11/2017   Encounter for prophylactic removal of breast 04/04/2017   Rectal bleeding 06/08/2016   Vaginal discharge 12/30/2015   Abdominal pain, epigastric 11/26/2013   GERD (gastroesophageal reflux disease)    Type 2 diabetes mellitus ([place])    CKD (chronic kidney disease) stage 3, GFR 30-59 ml/min (HCC)    External hemorrhoid 08/01/2011   Hematochezia 07/13/2011   Dyspnea 04/21/2010   Atypical chest pain 04/21/2010   Mixed hyperlipidemia 03/11/2008   OBESITY 03/11/2008   Essential hypertension, benign 03/11/2008    Past Surgical History:  Procedure Laterality Date   ABDOMINAL HYSTERECTOMY     APPENDECTOMY     ARTERY BIOPSY N/A 05/09/2018   Procedure: RIGHT TEMPORAL ARTERY BIOPSY;  Surgeon: Judeth Horn, MD;  Location: [place];  Service: General;  Laterality: N/A;   BREAST BIOPSY Right 06/15/2020   Procedure: RIGHT BREAST BIOPSY;  Surgeon: Aviva Signs, MD;  Location: [facility];  Service: General;  Laterality: Right;   CATARACT EXTRACTION W/PHACO Left 02/09/2017   Procedure: CATARACT EXTRACTION PHACO AND INTRAOCULAR LENS PLACEMENT LEFT EYE;  Surgeon: Tonny Branch, MD;  Location: [facility];  Service: Ophthalmology;  Laterality: Left;  CDE: 4.89   CATARACT EXTRACTION W/PHACO Right 06/04/2017   Procedure: CATARACT EXTRACTION PHACO AND INTRAOCULAR LENS PLACEMENT (IOC);  Surgeon: Tonny Branch, MD;  Location: [facility];  Service: Ophthalmology;  Laterality: Right;  CDE: 4.12   CHOLECYSTECTOMY  09/29/2011   Procedure: LAPAROSCOPIC CHOLECYSTECTOMY;  Surgeon: Jamesetta So, MD;  Location: [facility];  Service: General;  Laterality: N/A;   COLONOSCOPY  08/2009   Dr. Benson Norway: sessile polyp (benign lymphoid), large hemorrhoids, repeat 5-10 years   COLONOSCOPY N/A 06/12/2016    prominent hemorrhoids   ESOPHAGOGASTRODUODENOSCOPY  09/05/2011   RCV:ELFYB hiatal hernia; remainder of exam normal. No explanation for patient's abdominal pain with today's examination   ESOPHAGOGASTRODUODENOSCOPY N/A 12/17/2013   Dr. [name]: gastric erythema, erosion, mild chronic inflammation on path    EXCISION OF BREAST BIOPSY Right 10/12/2020   Procedure: EXCISION OF RIGHT BREAST BIOPSY;  Surgeon: Aviva Signs, MD;  Location: [facility];  Service: General;  Laterality: Right;   LAPAROSCOPIC APPENDECTOMY  09/29/2011   Procedure: APPENDECTOMY LAPAROSCOPIC;  Surgeon: Jamesetta So, MD;  Location: [facility];  Service: General;;  incidental appendectomy   LEFT HEART CATH AND CORONARY ANGIOGRAPHY N/A 02/28/2019   Procedure: LEFT HEART CATH AND CORONARY ANGIOGRAPHY;  Surgeon: Jettie Booze, MD;  Location: [place] [facility];  Service: Cardiovascular;  Laterality: N/A;   MASTECTOMY MODIFIED RADICAL Right 02/18/2020   Procedure: MASTECTOMY MODIFIED RADICAL;  Surgeon: Aviva Signs, MD;  Location: [facility];  Service: General;  Laterality: Right;   MASTECTOMY, PARTIAL Right 07/13/2020   Procedure: RIGHT PARTIAL MASTECTOMY;  Surgeon: Aviva Signs, MD;  Location: [facility];  Service: General;  Laterality: Right;   PARTIAL MASTECTOMY WITH NEEDLE LOCALIZATION AND AXILLARY SENTINEL LYMPH NODE BX Right 09/18/2018   Procedure: RIGHT PARTIAL MASTECTOMY AFTER NEEDLE LOCALIZATION, SENTINEL LYMPH NODE BIOPSY RIGHT AXILLA;  Surgeon: Aviva Signs, MD;  Location: [facility];  Service: General;  Laterality: Right;   SIMPLE MASTECTOMY WITH AXILLARY SENTINEL NODE BIOPSY Left 06/15/2020   Procedure: LEFT SIMPLE MASTECTOMY;  Surgeon: Aviva Signs, MD;  Location: [facility];  Service: General;  Laterality: Left;     OB History     Gravida      Para      Term      Preterm      AB      Living  1      SAB      IAB      Ectopic      Multiple      Live Births              Family History  Problem  Relation Age of Onset   Hypertension Mother    Coronary artery disease Mother    Diabetes Mother    Hypertension Sister    Coronary artery disease Sister    Hypertension Brother    Heart attack Father    Hypertension Son    Heart attack Maternal Aunt    Hypertension Maternal Aunt    Diabetes Maternal Aunt    Heart attack Maternal Uncle    Hypertension Maternal Uncle    Diabetes Maternal Uncle    Heart attack Paternal Aunt    Hypertension Paternal Aunt    Diabetes Paternal Aunt    Heart attack Paternal Uncle    Hypertension Paternal Uncle    Diabetes Paternal Uncle    Heart attack Maternal Grandmother    Heart attack Maternal Grandfather    Heart attack Paternal Grandmother    Heart attack Paternal Grandfather    Colon cancer Neg Hx     Social History   Tobacco Use   Smoking status: Never   Smokeless tobacco: Never  Vaping Use   Vaping Use: Never used  Substance Use Topics   Alcohol use: No   Drug use: No    Home Medications Prior to Admission medications   Medication Sig Start Date End Date Taking? Authorizing Provider  acetaminophen (TYLENOL) 500 MG tablet Take by mouth. 11/10/20 11/09/21 Yes [provider]  amLODipine (NORVASC) 10 MG tablet TAKE 1 TABLET BY MOUTH AT BEDTIME 11/25/20  Yes [name], MD  anastrozole (ARIMIDEX) 1 MG tablet TAKE 1 TABLET(1 MG) BY MOUTH DAILY 05/23/21  Yes Derek Jack, MD  aspirin EC 81 MG tablet Take 81 mg by mouth daily.   Yes [provider]  atorvastatin (LIPITOR) 20 MG tablet Take 1 tablet (20 mg total) by mouth daily. 06/29/20  Yes [name], MD  B Complex-C-Folic Acid (DIALYVITE 774) 0.8 MG TABS Take 1 tablet by mouth daily. 01/22/18  Yes [provider]  cinacalcet (SENSIPAR) 30 MG tablet Take 2 tablets (60 mg total) by mouth daily. 06/13/18  Yes [name], MD  DULoxetine (CYMBALTA) 30 MG capsule TAKE 1 CAPSULE(30 MG) BY MOUTH DAILY 02/07/21  Yes [name], MD   gabapentin (NEURONTIN) 300 MG capsule TAKE 1 CAPSULE(300 MG) BY MOUTH AT BEDTIME 12/13/20  Yes [name], MD  HYDROcodone-acetaminophen (NORCO/VICODIN) 5-325 MG tablet Take 1 tablet by mouth every 6 (six) hours as needed. 06/02/21  Yes Rhia Blatchford, Almyra Free, PA-C  lanthanum (  FOSRENOL) 1000 MG chewable tablet Chew 2,000-3,000 mg by mouth See admin instructions. Take 3 tablets (3000 mg) by mouth with meals and take 2 tablets (2000 mg) with snacks 10/16/19  Yes [provider]  lidocaine-prilocaine (EMLA) cream Apply 1 application topically every Monday, Wednesday, and Friday with hemodialysis. 09/24/18  Yes [provider]  nebivolol (BYSTOLIC) 10 MG tablet TAKE 1 TABLET IN THE MORNING AND AT BEDTIME 04/21/21  Yes [name], MD  pantoprazole (PROTONIX) 40 MG tablet TAKE 1 TABLET(40 MG) BY MOUTH DAILY 11/25/20  Yes [name], MD  meclizine (ANTIVERT) 25 MG tablet Take 1 tablet (25 mg total) by mouth 3 (three) times daily as needed for dizziness. Patient not taking: No sig reported 03/06/21   Luna Fuse, MD  polyethylene glycol-electrolytes (NULYTELY) 420 g solution As directed Patient not taking: Reported on 06/02/2021 05/25/21   Rourk, [name], MD    Allergies    Patient has no known allergies.  Review of Systems   Review of Systems  Constitutional:  Negative for chills and fever.  HENT:  Negative for congestion.   Eyes: Negative.   Respiratory:  Negative for chest tightness and shortness of breath.   Cardiovascular:  Negative for chest pain.  Gastrointestinal:  Positive for nausea. Negative for abdominal pain, constipation, diarrhea and vomiting.  Genitourinary:  Positive for flank pain. Negative for dysuria.  Musculoskeletal:  Negative for arthralgias, joint swelling and neck pain.  Skin: Negative.  Negative for rash and wound.  Neurological:  Negative for dizziness, weakness, light-headedness, numbness and headaches.  Psychiatric/Behavioral: Negative.    All  other systems reviewed and are negative.  Physical Exam Updated Vital Signs BP (!) 155/81   Pulse 82   Temp 98.6 F (37 C) (Oral)   Resp 17   Ht 5\' 2"  (1.575 m)   Wt 80.3 kg   SpO2 98%   BMI 32.37 kg/m   Physical Exam Vitals and nursing note reviewed.  Constitutional:      Appearance: She is well-developed.  HENT:     Head: Normocephalic and atraumatic.  Eyes:     Conjunctiva/sclera: Conjunctivae normal.  Cardiovascular:     Rate and Rhythm: Normal rate and regular rhythm.     Heart sounds: Normal heart sounds.  Pulmonary:     Effort: Pulmonary effort is normal.     Breath sounds: Normal breath sounds. No wheezing.  Abdominal:     General: Bowel sounds are normal.     Palpations: Abdomen is soft.     Tenderness: There is no abdominal tenderness. There is no guarding or rebound.     Comments: Pain at left flank which is not reproducible with palpation.  No CVA tenderness.  Musculoskeletal:        General: Normal range of motion.     Cervical back: Normal range of motion.  Skin:    General: Skin is warm and dry.  Neurological:     Mental Status: She is alert.    ED Results / Procedures / Treatments   Labs (all labs ordered are listed, but only abnormal results are displayed) Labs Reviewed  URINALYSIS, ROUTINE W REFLEX MICROSCOPIC - Abnormal; Notable for the following components:      Result Value   APPearance HAZY (*)    Hgb urine dipstick SMALL (*)    Protein, ur 100 (*)    Leukocytes,Ua TRACE (*)    Bacteria, UA RARE (*)    All other components within normal  limits  CBC WITH DIFFERENTIAL/PLATELET - Abnormal; Notable for the following components:   RBC 3.48 (*)    Hemoglobin 11.5 (*)    MCV 104.6 (*)    RDW 15.7 (*)    Monocytes Absolute 1.1 (*)    All other components within normal limits  COMPREHENSIVE METABOLIC PANEL - Abnormal; Notable for the following components:   Chloride 97 (*)    BUN 38 (*)    Creatinine, Ser 7.81 (*)    GFR, Estimated 6 (*)     All other components within normal limits  URINE CULTURE  LIPASE, BLOOD    EKG None  Radiology CT Renal Stone Study  Result Date: 06/02/2021 CLINICAL DATA:  Left flank pain. EXAM: CT ABDOMEN AND PELVIS WITHOUT CONTRAST TECHNIQUE: Multidetector CT imaging of the abdomen and pelvis was performed following the standard protocol without IV contrast. COMPARISON:  02/07/2018 FINDINGS: Lower chest: Few patchy densities at the lung bases are suggestive for areas of volume loss. No pleural effusions. Hepatobiliary: Cholecystectomy.  Normal appearance of the liver. Pancreas: Unremarkable. No pancreatic ductal dilatation or surrounding inflammatory changes. Spleen: Normal in size without focal abnormality. Adrenals/Urinary Tract: Normal adrenal glands. Both kidneys are atrophic with bilateral renal calculi. Largest left renal calculus measures roughly 3 mm. Largest right renal calculus measures 6 mm in lower pole. No hydronephrosis. Urinary bladder is decompressed. Indeterminate hyperdense exophytic structure in the kidney measures 1.1 cm. Stomach/Bowel: High-density contrast in the colon. No evidence for bowel distention or obstruction. Stomach is within normal limits. Question a small hiatal hernia. Vascular/Lymphatic: Atherosclerotic calcifications in the aorta and iliac arteries without aneurysm. No lymph node enlargement in the abdomen or pelvis. Reproductive: Status post hysterectomy. No adnexal masses. Other: Negative for ascites. Negative for free air. Small umbilical hernia containing fat. Musculoskeletal: No acute bone abnormality. IMPRESSION: 1. Bilateral nephrolithiasis.  Negative for hydronephrosis. 2. Indeterminate exophytic hyperdense structure in the right kidney measuring up to 1.1 cm. This could represent a hyperdense cyst but indeterminate. Consider further characterization with ultrasound. 3. Bilateral renal atrophy compatible with end-stage renal disease. Electronically Signed   By: Markus Daft M.D.   On: 06/02/2021 15:10    Procedures Procedures   Medications Ordered in ED Medications  sodium chloride 0.9 % bolus 250 mL (0 mLs Intravenous Stopped 06/02/21 1426)  ondansetron (ZOFRAN) injection 4 mg (4 mg Intravenous Given 06/02/21 1108)  morphine 2 MG/ML injection 2 mg (2 mg Intravenous Given 06/02/21 1109)  morphine 2 MG/ML injection 2 mg (2 mg Intravenous Given 06/02/21 1632)    ED Course  I have reviewed the triage vital signs and the nursing notes.  Pertinent labs & imaging results that were available during my care of the patient were reviewed by me and considered in my medical decision making (see chart for details).    MDM Rules/Calculators/A&P                           Labs and imaging reviewed and discussed with patient and her husband at the bedside.  She does have bilateral renal stones but no ureteral stones and no hydronephrosis.  We discussed the lesion in the right kidney that will require an outpatient ultrasound, she can discuss this with her nephrologist.  She is scheduled for dialysis tomorrow.  It is possible that she has passed a stone given there is some hemoglobin in her urine with ureteral colic persisting.  She was prescribed hydrocodone to help her  with pain relief.  A urine culture has been sent given her equivocal UA findings.  She denies dysuria.  The patient appears reasonably screened and/or stabilized for discharge and I doubt any other medical condition or other [facility] requiring further screening, evaluation, or treatment in the ED at this time prior to discharge.  Final Clinical Impression(s) / ED Diagnoses Final diagnoses:  Left flank pain  Bilateral renal stones  Renal cyst, right    Rx / DC Orders ED Discharge Orders          Ordered    HYDROcodone-acetaminophen (NORCO/VICODIN) 5-325 MG tablet  Every 6 hours PRN        06/02/21 1615             Evalee Jefferson, PA-C 06/02/21 1820    Milton Ferguson, MD 06/04/21 1015

## 2021-06-02 NOTE — ED Triage Notes (Signed)
Patient c/o left sided flank pain onset of yesterday after finishing dialysis treatment. States she had dialysis Monday & Tuesday so yesterday left with approx 50 mins left of treatment. States pain has been intermittent and returned severe last night around 2 am with nausea. Denies any discomfort or burning with voiding.

## 2021-06-02 NOTE — Discharge Instructions (Addendum)
As discussed, your CT shows that you do have bilateral kidney stones, but typically this does not cause pain unless a stone is actively moving down one of the ureteral tubes which is does not appear to be doing.  The right kidney also reflects a lesion which may be a simple cyst but it is suggested that you have an outpatient ultrasound of this kidney to confirm that this is a simple cyst and does not require any other intervention.  Please discuss this with Dr. Marval Regal or his PA when you are at dialysis tomorrow.  In the interim your urine culture is pending, there is no obvious urinary infection but the urine culture will confirm this.  You may use the medication prescribed if needed for pain relief.  Do not drive infers of taking this medication as it will make you drowsy.  You may try applying a heating pad to your flank region which may also offer some relief.

## 2021-06-03 DIAGNOSIS — N2581 Secondary hyperparathyroidism of renal origin: Secondary | ICD-10-CM | POA: Diagnosis not present

## 2021-06-03 DIAGNOSIS — N186 End stage renal disease: Secondary | ICD-10-CM | POA: Diagnosis not present

## 2021-06-03 DIAGNOSIS — Z992 Dependence on renal dialysis: Secondary | ICD-10-CM | POA: Diagnosis not present

## 2021-06-03 DIAGNOSIS — E1129 Type 2 diabetes mellitus with other diabetic kidney complication: Secondary | ICD-10-CM | POA: Diagnosis not present

## 2021-06-03 DIAGNOSIS — E119 Type 2 diabetes mellitus without complications: Secondary | ICD-10-CM | POA: Diagnosis not present

## 2021-06-03 DIAGNOSIS — D631 Anemia in chronic kidney disease: Secondary | ICD-10-CM | POA: Diagnosis not present

## 2021-06-04 LAB — URINE CULTURE

## 2021-06-06 ENCOUNTER — Other Ambulatory Visit (HOSPITAL_COMMUNITY): Payer: Self-pay | Admitting: Nephrology

## 2021-06-06 ENCOUNTER — Other Ambulatory Visit: Payer: Self-pay | Admitting: Nephrology

## 2021-06-06 DIAGNOSIS — E119 Type 2 diabetes mellitus without complications: Secondary | ICD-10-CM | POA: Diagnosis not present

## 2021-06-06 DIAGNOSIS — N186 End stage renal disease: Secondary | ICD-10-CM | POA: Diagnosis not present

## 2021-06-06 DIAGNOSIS — N2581 Secondary hyperparathyroidism of renal origin: Secondary | ICD-10-CM | POA: Diagnosis not present

## 2021-06-06 DIAGNOSIS — Z992 Dependence on renal dialysis: Secondary | ICD-10-CM | POA: Diagnosis not present

## 2021-06-06 DIAGNOSIS — E1129 Type 2 diabetes mellitus with other diabetic kidney complication: Secondary | ICD-10-CM | POA: Diagnosis not present

## 2021-06-06 DIAGNOSIS — N2889 Other specified disorders of kidney and ureter: Secondary | ICD-10-CM

## 2021-06-06 DIAGNOSIS — D631 Anemia in chronic kidney disease: Secondary | ICD-10-CM | POA: Diagnosis not present

## 2021-06-07 ENCOUNTER — Ambulatory Visit (INDEPENDENT_AMBULATORY_CARE_PROVIDER_SITE_OTHER): Payer: Medicare Other | Admitting: General Surgery

## 2021-06-07 ENCOUNTER — Encounter: Payer: Self-pay | Admitting: General Surgery

## 2021-06-07 ENCOUNTER — Other Ambulatory Visit: Payer: Self-pay

## 2021-06-07 VITALS — BP 139/76 | HR 88 | Temp 98.4°F | Resp 16 | Ht 62.0 in | Wt 178.0 lb

## 2021-06-07 DIAGNOSIS — Z9010 Acquired absence of unspecified breast and nipple: Secondary | ICD-10-CM | POA: Diagnosis not present

## 2021-06-07 DIAGNOSIS — G8918 Other acute postprocedural pain: Secondary | ICD-10-CM

## 2021-06-08 ENCOUNTER — Other Ambulatory Visit: Payer: Self-pay | Admitting: Pediatrics

## 2021-06-08 ENCOUNTER — Other Ambulatory Visit (HOSPITAL_COMMUNITY): Payer: Self-pay | Admitting: Pediatrics

## 2021-06-08 DIAGNOSIS — N2889 Other specified disorders of kidney and ureter: Secondary | ICD-10-CM

## 2021-06-08 NOTE — Progress Notes (Signed)
Subjective:     Megan [name]  Patient presents for follow-up of pain at her left mastectomy site.  She states that the pain is along the superior aspect of the mastectomy site over the pectoralis major muscle.  It is made worse with movement.  The pain is constant in nature.  She also complains of a ganglion cyst on her left index finger.  This is the same side as her AV fistula. Objective:    BP 139/76   Pulse 88   Temp 98.4 F (36.9 C) (Oral)   Resp 16   Ht 5\' 2"  (1.575 m)   Wt 178 lb (80.7 kg)   SpO2 92%   BMI 32.56 kg/m   General:  alert, cooperative, and no distress  Left mastectomy incision is well-healed with excess tissue present.  She does have scarring of the skin to the left pectoralis major muscle.  No masses palpable. Left index finger with ganglion cystlike lesion on the proximal metacarpal joint.     Assessment:    Postmastectomy pain which may be due to scarring of the superior flap to the pectoralis major muscle on the left side.    Plan:   I did offer revision of her left mastectomy surgical site with releasing the skin from the pectoralis major muscle.  It is hoped that this may resolve her pain.  She has other issues that are being addressed and she will return once those have been resolved.  She has been referred to Dr. Aline Brochure of orthopedic surgery for further evaluation of her left index finger.

## 2021-06-09 DIAGNOSIS — Z992 Dependence on renal dialysis: Secondary | ICD-10-CM | POA: Diagnosis not present

## 2021-06-09 DIAGNOSIS — N186 End stage renal disease: Secondary | ICD-10-CM | POA: Diagnosis not present

## 2021-06-09 DIAGNOSIS — D631 Anemia in chronic kidney disease: Secondary | ICD-10-CM | POA: Diagnosis not present

## 2021-06-09 DIAGNOSIS — E119 Type 2 diabetes mellitus without complications: Secondary | ICD-10-CM | POA: Diagnosis not present

## 2021-06-09 DIAGNOSIS — E1129 Type 2 diabetes mellitus with other diabetic kidney complication: Secondary | ICD-10-CM | POA: Diagnosis not present

## 2021-06-09 DIAGNOSIS — N2581 Secondary hyperparathyroidism of renal origin: Secondary | ICD-10-CM | POA: Diagnosis not present

## 2021-06-10 ENCOUNTER — Ambulatory Visit (HOSPITAL_COMMUNITY): Payer: Medicare Other

## 2021-06-10 ENCOUNTER — Ambulatory Visit: Payer: Medicare Other

## 2021-06-10 DIAGNOSIS — N186 End stage renal disease: Secondary | ICD-10-CM | POA: Diagnosis not present

## 2021-06-10 DIAGNOSIS — E1129 Type 2 diabetes mellitus with other diabetic kidney complication: Secondary | ICD-10-CM | POA: Diagnosis not present

## 2021-06-10 DIAGNOSIS — E119 Type 2 diabetes mellitus without complications: Secondary | ICD-10-CM | POA: Diagnosis not present

## 2021-06-10 DIAGNOSIS — N2581 Secondary hyperparathyroidism of renal origin: Secondary | ICD-10-CM | POA: Diagnosis not present

## 2021-06-10 DIAGNOSIS — Z992 Dependence on renal dialysis: Secondary | ICD-10-CM | POA: Diagnosis not present

## 2021-06-10 DIAGNOSIS — D631 Anemia in chronic kidney disease: Secondary | ICD-10-CM | POA: Diagnosis not present

## 2021-06-10 NOTE — Patient Instructions (Signed)
Megan [name]  06/10/2021     @PREFPERIOPPHARMACY @   Your procedure is scheduled on  06/16/2021.   Report to [name] at  [place]  A.M.   Call this number if you have problems the morning of surgery:  [phone]   Remember:  Follow the diet and prep instructions given to you by the office.    Take these medicines the morning of surgery with A SIP OF WATER               armidex, sensipar, hydrocodone (if needed), bystolic, protonix.     Do not wear jewelry, make-up or nail polish.  Do not wear lotions, powders, or perfumes, or deodorant.  Do not shave 48 hours prior to surgery.  Men may shave face and neck.  Do not bring valuables to the hospital.  [facility] is not responsible for any belongings or valuables.  Contacts, dentures or bridgework may not be worn into surgery.  Leave your suitcase in the car.  After surgery it may be brought to your room.  For patients admitted to the hospital, discharge time will be determined by your treatment team.  Patients discharged the day of surgery will not be allowed to drive home and must have someone with them for 24 hours.    Special instructions:   DO NOT smoke tobacco or vape for 24 hours before your procedure.  Please read over the following fact sheets that you were given. Anesthesia Post-op Instructions and Care and Recovery After Surgery      Upper Endoscopy, Adult, Care After This sheet gives you information about how to care for yourself after your procedure. Your health care provider may also give you more specific instructions. If you have problems or questions, contact your health care provider. What can I expect after the procedure? After the procedure, it is common to have: A sore throat. Mild stomach pain or discomfort. Bloating. Nausea. Follow these instructions at home:  Follow instructions from your health care provider about what to eat or drink after your procedure. Return to your  normal activities as told by your health care provider. Ask your health care provider what activities are safe for you. Take over-the-counter and prescription medicines only as told by your health care provider. If you were given a sedative during the procedure, it can affect you for several hours. Do not drive or operate machinery until your health care provider says that it is safe. Keep all follow-up visits as told by your health care provider. This is important. Contact a health care provider if you have: A sore throat that lasts longer than one day. Trouble swallowing. Get help right away if: You vomit blood or your vomit looks like coffee grounds. You have: A fever. Bloody, black, or tarry stools. A severe sore throat or you cannot swallow. Difficulty breathing. Severe pain in your chest or abdomen. Summary After the procedure, it is common to have a sore throat, mild stomach discomfort, bloating, and nausea. If you were given a sedative during the procedure, it can affect you for several hours. Do not drive or operate machinery until your health care provider says that it is safe. Follow instructions from your health care provider about what to eat or drink after your procedure. Return to your normal activities as told by your health care provider. This information is not intended to replace advice given to you by your health care provider. Make  sure you discuss any questions you have with your health care provider. Document Revised: 07/29/2019 Document Reviewed: 12/31/2017 Elsevier Patient Education  2022 [place]. Colonoscopy, Adult, Care After This sheet gives you information about how to care for yourself after your procedure. Your health care provider may also give you more specific instructions. If you have problems or questions, contact your health care provider. What can I expect after the procedure? After the procedure, it is common to have: A small amount of blood in  your stool for 24 hours after the procedure. Some gas. Mild cramping or bloating of your abdomen. Follow these instructions at home: Eating and drinking  Drink enough fluid to keep your urine pale yellow. Follow instructions from your health care provider about eating or drinking restrictions. Resume your normal diet as instructed by your health care provider. Avoid heavy or fried foods that are hard to digest. Activity Rest as told by your health care provider. Avoid sitting for a long time without moving. Get up to take short walks every 1-2 hours. This is important to improve blood flow and breathing. Ask for help if you feel weak or unsteady. Return to your normal activities as told by your health care provider. Ask your health care provider what activities are safe for you. Managing cramping and bloating  Try walking around when you have cramps or feel bloated. Apply heat to your abdomen as told by your health care provider. Use the heat source that your health care provider recommends, such as a moist heat pack or a heating pad. Place a towel between your skin and the heat source. Leave the heat on for 20-30 minutes. Remove the heat if your skin turns bright red. This is especially important if you are unable to feel pain, heat, or cold. You may have a greater risk of getting burned. General instructions If you were given a sedative during the procedure, it can affect you for several hours. Do not drive or operate machinery until your health care provider says that it is safe. For the first 24 hours after the procedure: Do not sign important documents. Do not drink alcohol. Do your regular daily activities at a slower pace than normal. Eat soft foods that are easy to digest. Take over-the-counter and prescription medicines only as told by your health care provider. Keep all follow-up visits as told by your health care provider. This is important. Contact a health care provider  if: You have blood in your stool 2-3 days after the procedure. Get help right away if you have: More than a small spotting of blood in your stool. Large blood clots in your stool. Swelling of your abdomen. Nausea or vomiting. A fever. Increasing pain in your abdomen that is not relieved with medicine. Summary After the procedure, it is common to have a small amount of blood in your stool. You may also have mild cramping and bloating of your abdomen. If you were given a sedative during the procedure, it can affect you for several hours. Do not drive or operate machinery until your health care provider says that it is safe. Get help right away if you have a lot of blood in your stool, nausea or vomiting, a fever, or increased pain in your abdomen. This information is not intended to replace advice given to you by your health care provider. Make sure you discuss any questions you have with your health care provider. Document Revised: 07/25/2019 Document Reviewed: 02/24/2019 Elsevier Patient Education  [place] After This sheet gives you information about how to care for yourself after your procedure. Your health care provider may also give you more specific instructions. If you have problems or questions, contact your health care provider. What can I expect after the procedure? After the procedure, it is common to have: Tiredness. Forgetfulness about what happened after the procedure. Impaired judgment for important decisions. Nausea or vomiting. Some difficulty with balance. Follow these instructions at home: For the time period you were told by your health care provider:   Rest as needed. Do not participate in activities where you could fall or become injured. Do not drive or use machinery. Do not drink alcohol. Do not take sleeping pills or medicines that cause drowsiness. Do not make important decisions or sign legal documents. Do not take  care of children on your own. Eating and drinking Follow the diet that is recommended by your health care provider. Drink enough fluid to keep your urine pale yellow. If you vomit: Drink water, juice, or soup when you can drink without vomiting. Make sure you have little or no nausea before eating solid foods. General instructions Have a responsible adult stay with you for the time you are told. It is important to have someone help care for you until you are awake and alert. Take over-the-counter and prescription medicines only as told by your health care provider. If you have sleep apnea, surgery and certain medicines can increase your risk for breathing problems. Follow instructions from your health care provider about wearing your sleep device: Anytime you are sleeping, including during daytime naps. While taking prescription pain medicines, sleeping medicines, or medicines that make you drowsy. Avoid smoking. Keep all follow-up visits as told by your health care provider. This is important. Contact a health care provider if: You keep feeling nauseous or you keep vomiting. You feel light-headed. You are still sleepy or having trouble with balance after 24 hours. You develop a rash. You have a fever. You have redness or swelling around the IV site. Get help right away if: You have trouble breathing. You have new-onset confusion at home. Summary For several hours after your procedure, you may feel tired. You may also be forgetful and have poor judgment. Have a responsible adult stay with you for the time you are told. It is important to have someone help care for you until you are awake and alert. Rest as told. Do not drive or operate machinery. Do not drink alcohol or take sleeping pills. Get help right away if you have trouble breathing, or if you suddenly become confused. This information is not intended to replace advice given to you by your health care provider. Make sure you  discuss any questions you have with your health care provider. Document Revised: 04/15/2020 Document Reviewed: 07/03/2019 Elsevier Patient Education  2022 [employer].

## 2021-06-13 ENCOUNTER — Other Ambulatory Visit: Payer: Self-pay

## 2021-06-13 ENCOUNTER — Other Ambulatory Visit: Payer: Self-pay | Admitting: Family Medicine

## 2021-06-13 ENCOUNTER — Encounter (HOSPITAL_COMMUNITY): Payer: Self-pay

## 2021-06-13 ENCOUNTER — Encounter (HOSPITAL_COMMUNITY)
Admission: RE | Admit: 2021-06-13 | Discharge: 2021-06-13 | Disposition: A | Discharge status: Home / Self Care | Payer: Medicare Other | Source: Ambulatory Visit | Attending: Internal Medicine | Admitting: Internal Medicine

## 2021-06-13 DIAGNOSIS — N2581 Secondary hyperparathyroidism of renal origin: Secondary | ICD-10-CM | POA: Diagnosis not present

## 2021-06-13 DIAGNOSIS — E119 Type 2 diabetes mellitus without complications: Secondary | ICD-10-CM | POA: Diagnosis not present

## 2021-06-13 DIAGNOSIS — N186 End stage renal disease: Secondary | ICD-10-CM | POA: Diagnosis not present

## 2021-06-13 DIAGNOSIS — D631 Anemia in chronic kidney disease: Secondary | ICD-10-CM | POA: Diagnosis not present

## 2021-06-13 DIAGNOSIS — E1129 Type 2 diabetes mellitus with other diabetic kidney complication: Secondary | ICD-10-CM | POA: Diagnosis not present

## 2021-06-13 DIAGNOSIS — Z992 Dependence on renal dialysis: Secondary | ICD-10-CM | POA: Diagnosis not present

## 2021-06-14 DIAGNOSIS — E782 Mixed hyperlipidemia: Secondary | ICD-10-CM | POA: Diagnosis not present

## 2021-06-14 DIAGNOSIS — N2581 Secondary hyperparathyroidism of renal origin: Secondary | ICD-10-CM | POA: Diagnosis not present

## 2021-06-14 DIAGNOSIS — Z992 Dependence on renal dialysis: Secondary | ICD-10-CM | POA: Diagnosis not present

## 2021-06-14 DIAGNOSIS — K921 Melena: Secondary | ICD-10-CM | POA: Diagnosis not present

## 2021-06-14 DIAGNOSIS — I2510 Atherosclerotic heart disease of native coronary artery without angina pectoris: Secondary | ICD-10-CM | POA: Diagnosis not present

## 2021-06-14 DIAGNOSIS — I5032 Chronic diastolic (congestive) heart failure: Secondary | ICD-10-CM | POA: Diagnosis not present

## 2021-06-14 DIAGNOSIS — K640 First degree hemorrhoids: Secondary | ICD-10-CM | POA: Diagnosis not present

## 2021-06-14 DIAGNOSIS — E1129 Type 2 diabetes mellitus with other diabetic kidney complication: Secondary | ICD-10-CM | POA: Diagnosis not present

## 2021-06-14 DIAGNOSIS — N186 End stage renal disease: Secondary | ICD-10-CM | POA: Diagnosis not present

## 2021-06-14 DIAGNOSIS — E119 Type 2 diabetes mellitus without complications: Secondary | ICD-10-CM | POA: Diagnosis not present

## 2021-06-14 DIAGNOSIS — E8770 Fluid overload, unspecified: Secondary | ICD-10-CM | POA: Diagnosis not present

## 2021-06-14 DIAGNOSIS — D631 Anemia in chronic kidney disease: Secondary | ICD-10-CM | POA: Diagnosis not present

## 2021-06-14 DIAGNOSIS — K317 Polyp of stomach and duodenum: Secondary | ICD-10-CM | POA: Diagnosis not present

## 2021-06-14 DIAGNOSIS — K219 Gastro-esophageal reflux disease without esophagitis: Secondary | ICD-10-CM | POA: Diagnosis not present

## 2021-06-14 DIAGNOSIS — K449 Diaphragmatic hernia without obstruction or gangrene: Secondary | ICD-10-CM | POA: Diagnosis not present

## 2021-06-14 DIAGNOSIS — E1122 Type 2 diabetes mellitus with diabetic chronic kidney disease: Secondary | ICD-10-CM | POA: Diagnosis not present

## 2021-06-14 DIAGNOSIS — N040 Nephrotic syndrome with minor glomerular abnormality: Secondary | ICD-10-CM | POA: Diagnosis not present

## 2021-06-14 DIAGNOSIS — I132 Hypertensive heart and chronic kidney disease with heart failure and with stage 5 chronic kidney disease, or end stage renal disease: Secondary | ICD-10-CM | POA: Diagnosis not present

## 2021-06-14 DIAGNOSIS — R1012 Left upper quadrant pain: Secondary | ICD-10-CM | POA: Diagnosis not present

## 2021-06-16 ENCOUNTER — Ambulatory Visit (HOSPITAL_COMMUNITY): Payer: Medicare Other | Admitting: Anesthesiology

## 2021-06-16 ENCOUNTER — Encounter (HOSPITAL_COMMUNITY)
Admission: RE | Disposition: A | Discharge status: Home / Self Care | Payer: Self-pay | Source: Home / Self Care | Attending: Internal Medicine

## 2021-06-16 ENCOUNTER — Telehealth: Payer: Self-pay | Admitting: Internal Medicine

## 2021-06-16 ENCOUNTER — Encounter (HOSPITAL_COMMUNITY): Payer: Self-pay | Admitting: Internal Medicine

## 2021-06-16 ENCOUNTER — Ambulatory Visit (HOSPITAL_COMMUNITY)
Admission: RE | Admit: 2021-06-16 | Discharge: 2021-06-16 | Disposition: A | Discharge status: Home / Self Care | Payer: Medicare Other | Attending: Internal Medicine | Admitting: Internal Medicine

## 2021-06-16 DIAGNOSIS — Z6832 Body mass index (BMI) 32.0-32.9, adult: Secondary | ICD-10-CM | POA: Insufficient documentation

## 2021-06-16 DIAGNOSIS — Z992 Dependence on renal dialysis: Secondary | ICD-10-CM | POA: Diagnosis not present

## 2021-06-16 DIAGNOSIS — N186 End stage renal disease: Secondary | ICD-10-CM | POA: Diagnosis not present

## 2021-06-16 DIAGNOSIS — K921 Melena: Secondary | ICD-10-CM | POA: Insufficient documentation

## 2021-06-16 DIAGNOSIS — I132 Hypertensive heart and chronic kidney disease with heart failure and with stage 5 chronic kidney disease, or end stage renal disease: Secondary | ICD-10-CM | POA: Insufficient documentation

## 2021-06-16 DIAGNOSIS — I2510 Atherosclerotic heart disease of native coronary artery without angina pectoris: Secondary | ICD-10-CM | POA: Insufficient documentation

## 2021-06-16 DIAGNOSIS — E782 Mixed hyperlipidemia: Secondary | ICD-10-CM | POA: Insufficient documentation

## 2021-06-16 DIAGNOSIS — R1012 Left upper quadrant pain: Secondary | ICD-10-CM | POA: Insufficient documentation

## 2021-06-16 DIAGNOSIS — K449 Diaphragmatic hernia without obstruction or gangrene: Secondary | ICD-10-CM | POA: Insufficient documentation

## 2021-06-16 DIAGNOSIS — K640 First degree hemorrhoids: Secondary | ICD-10-CM | POA: Insufficient documentation

## 2021-06-16 DIAGNOSIS — K219 Gastro-esophageal reflux disease without esophagitis: Secondary | ICD-10-CM | POA: Insufficient documentation

## 2021-06-16 DIAGNOSIS — T189XXA Foreign body of alimentary tract, part unspecified, initial encounter: Secondary | ICD-10-CM

## 2021-06-16 DIAGNOSIS — Z79899 Other long term (current) drug therapy: Secondary | ICD-10-CM | POA: Diagnosis not present

## 2021-06-16 DIAGNOSIS — K317 Polyp of stomach and duodenum: Secondary | ICD-10-CM | POA: Insufficient documentation

## 2021-06-16 DIAGNOSIS — E119 Type 2 diabetes mellitus without complications: Secondary | ICD-10-CM | POA: Insufficient documentation

## 2021-06-16 DIAGNOSIS — I5032 Chronic diastolic (congestive) heart failure: Secondary | ICD-10-CM | POA: Insufficient documentation

## 2021-06-16 DIAGNOSIS — E1122 Type 2 diabetes mellitus with diabetic chronic kidney disease: Secondary | ICD-10-CM | POA: Insufficient documentation

## 2021-06-16 HISTORY — PX: POLYPECTOMY: SHX5525

## 2021-06-16 HISTORY — PX: ESOPHAGOGASTRODUODENOSCOPY (EGD) WITH PROPOFOL: SHX5813

## 2021-06-16 HISTORY — PX: COLONOSCOPY WITH PROPOFOL: SHX5780

## 2021-06-16 LAB — POCT I-STAT, CHEM 8
BUN: 50 mg/dL — ABNORMAL HIGH (ref 6–20)
Calcium, Ion: 1.11 mmol/L — ABNORMAL LOW (ref 1.15–1.40)
Chloride: 95 mmol/L — ABNORMAL LOW (ref 98–111)
Creatinine, Ser: 10.5 mg/dL — ABNORMAL HIGH (ref 0.44–1.00)
Glucose, Bld: 105 mg/dL — ABNORMAL HIGH (ref 70–99)
HCT: 39 % (ref 36.0–46.0)
Hemoglobin: 13.3 g/dL (ref 12.0–15.0)
Potassium: 3.9 mmol/L (ref 3.5–5.1)
Sodium: 132 mmol/L — ABNORMAL LOW (ref 135–145)
TCO2: 32 mmol/L (ref 22–32)

## 2021-06-16 SURGERY — COLONOSCOPY WITH PROPOFOL
Anesthesia: General

## 2021-06-16 MED ORDER — PHENYLEPHRINE HCL (PRESSORS) 10 MG/ML IV SOLN
INTRAVENOUS | Status: DC | PRN
  Administered 2021-06-16 (×4): 100 ug via INTRAVENOUS

## 2021-06-16 MED ORDER — SODIUM CHLORIDE 0.9 % IV SOLN: INTRAVENOUS | Status: DC | PRN

## 2021-06-16 MED ORDER — LACTATED RINGERS IV SOLN: INTRAVENOUS | Status: DC | PRN

## 2021-06-16 MED ORDER — PHENYLEPHRINE HCL-NACL 20-0.9 MG/250ML-% IV SOLN
INTRAVENOUS | Status: AC
  Filled 2021-06-16: qty 250

## 2021-06-16 MED ORDER — PROPOFOL 500 MG/50ML IV EMUL
INTRAVENOUS | Status: DC | PRN
  Administered 2021-06-16: 150 ug/kg/min via INTRAVENOUS

## 2021-06-16 MED ORDER — PROPOFOL 10 MG/ML IV BOLUS
INTRAVENOUS | Status: DC | PRN
  Administered 2021-06-16: 100 mg via INTRAVENOUS

## 2021-06-16 MED ORDER — STERILE WATER FOR IRRIGATION IR SOLN
Status: DC | PRN
  Administered 2021-06-16: .6 mL

## 2021-06-16 MED ORDER — PROPOFOL 1000 MG/100ML IV EMUL
INTRAVENOUS | Status: AC
  Filled 2021-06-16: qty 100

## 2021-06-16 MED ORDER — LACTATED RINGERS IV SOLN: INTRAVENOUS | Status: DC

## 2021-06-16 NOTE — Op Note (Signed)
[facility] Patient Name: Megan [name] Procedure Date: 06/16/2021 9:30 AM MRN: [id number] Date of Birth: 04/21/1961 Attending MD: Norvel Richards , MD CSN: [id number] Age: 60 Admit Type: Outpatient Procedure:                Upper GI endoscopy Indications:              Abdominal pain in the left upper quadrant Providers:                Norvel Richards, MD, Janeece Riggers, RN, Randa Spike, Technician Referring MD:              Medicines:                Propofol per Anesthesia Complications:            No immediate complications. Estimated Blood Loss:     Estimated blood loss was minimal. Procedure:                Pre-Anesthesia Assessment:                           - Prior to the procedure, a History and Physical                            was performed, and patient medications and                            allergies were reviewed. The patient's tolerance of                            previous anesthesia was also reviewed. The risks                            and benefits of the procedure and the sedation                            options and risks were discussed with the patient.                            All questions were answered, and informed consent                            was obtained. Prior Anticoagulants: The patient has                            taken no previous anticoagulant or antiplatelet                            agents. ASA Grade Assessment: III - A patient with                            severe systemic disease. After reviewing the risks  and benefits, the patient was deemed in                            satisfactory condition to undergo the procedure.                           After obtaining informed consent, the endoscope was                            passed under direct vision. Throughout the                            procedure, the patient's blood pressure, pulse, and                             oxygen saturations were monitored continuously. The                            GIF-H190 ([id number]) scope was introduced through the                            mouth, and advanced to the second part of duodenum.                            The upper GI endoscopy was accomplished without                            difficulty. The patient tolerated the procedure                            well. Scope In: 9:52:42 AM Scope Out: 10:11:24 AM Total Procedure Duration: 0 hours 18 minutes 42 seconds  Findings:      The examined esophagus was normal. Small hiatal hernia.      One 25 mm pedunculated polyp with no bleeding and no stigmata of recent       bleeding was found in the gastric antrum. The polyp was removed with a       hot snare. Resection and retrieval were complete. Estimated blood loss       was minimal.      The duodenal bulb and second portion of the duodenum were normal. 360       ultra clips x2 placed on polypectomy stalk with good hemostasis obtained. Impression:               - Normal esophagus. Small hiatal hernia                           - One gastric polyp. Resected and retrieved. Clips                            placed                           - Normal duodenal bulb and second portion of the  duodenum. Moderate Sedation:      Moderate (conscious) sedation was personally administered by an       anesthesia professional. The following parameters were monitored: oxygen       saturation, heart rate, blood pressure, respiratory rate, EKG, adequacy       of pulmonary ventilation, and response to care. Recommendation:           - Patient has a contact number available for                            emergencies. The signs and symptoms of potential                            delayed complications were discussed with the                            patient. Return to normal activities tomorrow.                            Written discharge  instructions were provided to the                            patient.                           - Advance diet as tolerated. take Protonix 40 mg                            every day instead of as needed. I suspect left                            upper quadrant abdominal pain is related to reflux.                            No MRI until clips gone. Follow-up on pathology.                            Office visit with [place] in 6 weeks. See colonoscopy                            report. Procedure Code(s):        --- Professional ---                           [id number], Esophagogastroduodenoscopy, flexible,                            transoral; with removal of tumor(s), polyp(s), or                            other lesion(s) by snare technique Diagnosis Code(s):        --- Professional ---                           K31.7, Polyp of stomach and duodenum  R10.12, Left upper quadrant pain CPT copyright 2019 American Medical Association. All rights reserved. The codes documented in this report are preliminary and upon coder review may  be revised to meet current compliance requirements. [name]. Chrystle Murillo, MD Norvel Richards, MD 06/16/2021 10:32:44 AM This report has been signed electronically. Number of Addenda: 0

## 2021-06-16 NOTE — Anesthesia Preprocedure Evaluation (Signed)
Anesthesia Evaluation  Patient identified by MRN, date of birth, ID band Patient awake    Reviewed: Allergy & Precautions, H&P , NPO status , Patient's Chart, lab work & pertinent test results, reviewed documented beta blocker date and time   History of Anesthesia Complications (+) PONV and history of anesthetic complications  Airway Mallampati: II  TM Distance: >3 FB Neck ROM: full    Dental no notable dental hx.    Pulmonary shortness of breath, pneumonia,    Pulmonary exam normal breath sounds clear to auscultation       Cardiovascular Exercise Tolerance: Good hypertension, + CAD and +CHF   Rhythm:regular Rate:Normal     Neuro/Psych  Headaches, negative psych ROS   GI/Hepatic Neg liver ROS, GERD  Medicated,  Endo/Other  diabetesHypothyroidism   Renal/GU ESRF and CRFRenal disease  negative genitourinary   Musculoskeletal   Abdominal   Peds  Hematology  (+) Blood dyscrasia, anemia ,   Anesthesia Other Findings   Reproductive/Obstetrics negative OB ROS                             Anesthesia Physical Anesthesia Plan  ASA: 3  Anesthesia Plan: General   Post-op Pain Management:    Induction:   PONV Risk Score and Plan: Propofol infusion  Airway Management Planned:   Additional Equipment:   Intra-op Plan:   Post-operative Plan:   Informed Consent: I have reviewed the patients History and Physical, chart, labs and discussed the procedure including the risks, benefits and alternatives for the proposed anesthesia with the patient or authorized representative who has indicated his/her understanding and acceptance.     Dental Advisory Given  Plan Discussed with: CRNA  Anesthesia Plan Comments:         Anesthesia Quick Evaluation

## 2021-06-16 NOTE — Transfer of Care (Signed)
Immediate Anesthesia Transfer of Care Note  Patient: Megan [name]  Procedure(s) Performed: COLONOSCOPY WITH PROPOFOL ESOPHAGOGASTRODUODENOSCOPY (EGD) WITH PROPOFOL POLYPECTOMY  Patient Location: PACU  Anesthesia Type:General  Level of Consciousness: awake, alert , oriented and patient cooperative  Airway & Oxygen Therapy: Patient Spontanous Breathing  Post-op Assessment: Report given to RN, Post -op Vital signs reviewed and stable and Patient moving all extremities X 4  Post vital signs: Reviewed and stable  Last Vitals:  Vitals Value Taken Time  BP    Temp    Pulse    Resp    SpO2      Last Pain:  Vitals:   06/16/21 0949  TempSrc:   PainSc: 0-No pain         Complications: No notable events documented.

## 2021-06-16 NOTE — Telephone Encounter (Signed)
Please advise on what to do about rescheduling MRI.

## 2021-06-16 NOTE — Telephone Encounter (Signed)
Routing to [facility] and [facility] clinical pool to further assist.

## 2021-06-16 NOTE — Telephone Encounter (Signed)
We did schedule the MRI for pt. It wasn't ordered by [place]

## 2021-06-16 NOTE — Telephone Encounter (Signed)
Endo called and the patient had clips placed today after her procedure, she is scheduled for a MRI next week and that needs to be rescheduled until after she is cleared.

## 2021-06-16 NOTE — Discharge Instructions (Addendum)
Colonoscopy Discharge Instructions  Read the instructions outlined below and refer to this sheet in the next few weeks. These discharge instructions provide you with general information on caring for yourself after you leave the hospital. Your doctor may also give you specific instructions. While your treatment has been planned according to the most current medical practices available, unavoidable complications occasionally occur. If you have any problems or questions after discharge, call Dr. [name] at [phone]. ACTIVITY You may resume your regular activity, but move at a slower pace for the next 24 hours.  Take frequent rest periods for the next 24 hours.  Walking will help get rid of the air and reduce the bloated feeling in your belly (abdomen).  No driving for 24 hours (because of the medicine (anesthesia) used during the test).   Do not sign any important legal documents or operate any machinery for 24 hours (because of the anesthesia used during the test).  NUTRITION Drink plenty of fluids.  You may resume your normal diet as instructed by your doctor.  Begin with a light meal and progress to your normal diet. Heavy or fried foods are harder to digest and may make you feel sick to your stomach (nauseated).  Avoid alcoholic beverages for 24 hours or as instructed.  MEDICATIONS You may resume your normal medications unless your doctor tells you otherwise.  WHAT YOU CAN EXPECT TODAY Some feelings of bloating in the abdomen.  Passage of more gas than usual.  Spotting of blood in your stool or on the toilet paper.  IF YOU HAD POLYPS REMOVED DURING THE COLONOSCOPY: No aspirin products for 7 days or as instructed.  No alcohol for 7 days or as instructed.  Eat a soft diet for the next 24 hours.  FINDING OUT THE RESULTS OF YOUR TEST Not all test results are available during your visit. If your test results are not back during the visit, make an appointment with your caregiver to find out the  results. Do not assume everything is normal if you have not heard from your caregiver or the medical facility. It is important for you to follow up on all of your test results.  SEEK IMMEDIATE MEDICAL ATTENTION IF: You have more than a spotting of blood in your stool.  Your belly is swollen (abdominal distention).  You are nauseated or vomiting.  You have a temperature over 101.  You have abdominal pain or discomfort that is severe or gets worse throughout the day.   EGD Discharge instructions Please read the instructions outlined below and refer to this sheet in the next few weeks. These discharge instructions provide you with general information on caring for yourself after you leave the hospital. Your doctor may also give you specific instructions. While your treatment has been planned according to the most current medical practices available, unavoidable complications occasionally occur. If you have any problems or questions after discharge, please call your doctor. ACTIVITY You may resume your regular activity but move at a slower pace for the next 24 hours.  Take frequent rest periods for the next 24 hours.  Walking will help expel (get rid of) the air and reduce the bloated feeling in your abdomen.  No driving for 24 hours (because of the anesthesia (medicine) used during the test).  You may shower.  Do not sign any important legal documents or operate any machinery for 24 hours (because of the anesthesia used during the test).  NUTRITION Drink plenty of fluids.  You may  resume your normal diet.  Begin with a light meal and progress to your normal diet.  Avoid alcoholic beverages for 24 hours or as instructed by your caregiver.  MEDICATIONS You may resume your normal medications unless your caregiver tells you otherwise.  WHAT YOU CAN EXPECT TODAY You may experience abdominal discomfort such as a feeling of fullness or "gas" pains.  FOLLOW-UP Your doctor will discuss the results of  your test with you.  SEEK IMMEDIATE MEDICAL ATTENTION IF ANY OF THE FOLLOWING OCCUR: Excessive nausea (feeling sick to your stomach) and/or vomiting.  Severe abdominal pain and distention (swelling).  Trouble swallowing.  Temperature over 101 F (37.8 C).  Rectal bleeding or vomiting of blood.    GERD information provided  Take Protonix 40 mg every day not as needed   stomach polyp removed today.  No future MRI until clips gone   further recommendations to follow pending review of pathology report  Pamphlet on hemorrhoid banding provided  Repeat colonoscopy for screening purposes in 10 years  Office visit with Roseanne Kaufman in 6 weeks for possible hemorrhoid banding  At patient request, I called Landra Howze at [phone] -  call rolled to voicemail.  Left a message.

## 2021-06-16 NOTE — H&P (Signed)
@LOGO @   Primary Care Physician:  [name], MD Primary Gastroenterologist:  Dr. [name]  Pre-Procedure History & Physical: HPI:  Megan [name] is a 60 y.o. female here for  diagnostic colonoscopy.  Rectal recent hematochezia in the setting of recurrent C. difficile.  Also intermittent left upper quadrant abdominal pain.  Has no dysphagia.  Does have heartburn and associated left upper quadrant abdominal pain.  Only takes Protonix "as needed".  Past Medical History:  Diagnosis Date   (HFpEF) heart failure with preserved ejection fraction ([place])    a. 01/2019 Echo: EF 55-60%, mild conc LVH. DD.  Torn MV chordae.   Anemia    Atypical chest pain    a. 08/2018 MV: EF 59%, no ischemia; b. 02/2019 Cath: nonobs dzs.   Blood transfusion without reported diagnosis    Breast cancer ([place]) 10/12/2020   Cataract    ESRD (end stage renal disease) on dialysis [facility])    a. HD T, T, S   Essential hypertension, benign    GERD (gastroesophageal reflux disease)    Headache    Hemorrhoids    Mixed hyperlipidemia    Morbid obesity (H[facility])    Non-obstructive CAD (coronary artery disease)    a. 02/2019 CathL LM nl, LAD 27m, LCX nl, RCA 25p, EF 55-65%.   PONV (postoperative nausea and vomiting)    S/P colonoscopy Jan 2011   Dr. Benson Norway: sessile polyp (benign lymphoid), large hemorrhoids, repeat 5-10 years   Temporal arteritis ([place])    Type 2 diabetes mellitus ([place])    Wears glasses     Past Surgical History:  Procedure Laterality Date   ABDOMINAL HYSTERECTOMY     APPENDECTOMY     ARTERY BIOPSY N/A 05/09/2018   Procedure: RIGHT TEMPORAL ARTERY BIOPSY;  Surgeon: Judeth Horn, MD;  Location: [place];  Service: General;  Laterality: N/A;   BREAST BIOPSY Right 06/15/2020   Procedure: RIGHT BREAST BIOPSY;  Surgeon: Aviva Signs, MD;  Location: [facility];  Service: General;  Laterality: Right;   CATARACT EXTRACTION W/PHACO Left 02/09/2017   Procedure: CATARACT EXTRACTION PHACO AND INTRAOCULAR LENS  PLACEMENT LEFT EYE;  Surgeon: Tonny Branch, MD;  Location: [facility];  Service: Ophthalmology;  Laterality: Left;  CDE: 4.89   CATARACT EXTRACTION W/PHACO Right 06/04/2017   Procedure: CATARACT EXTRACTION PHACO AND INTRAOCULAR LENS PLACEMENT (IOC);  Surgeon: Tonny Branch, MD;  Location: [facility];  Service: Ophthalmology;  Laterality: Right;  CDE: 4.12   CHOLECYSTECTOMY  09/29/2011   Procedure: LAPAROSCOPIC CHOLECYSTECTOMY;  Surgeon: Jamesetta So, MD;  Location: [facility];  Service: General;  Laterality: N/A;   COLONOSCOPY  08/2009   Dr. Benson Norway: sessile polyp (benign lymphoid), large hemorrhoids, repeat 5-10 years   COLONOSCOPY N/A 06/12/2016   prominent hemorrhoids   ESOPHAGOGASTRODUODENOSCOPY  09/05/2011   XIH:WTUUE hiatal hernia; remainder of exam normal. No explanation for patient's abdominal pain with today's examination   ESOPHAGOGASTRODUODENOSCOPY N/A 12/17/2013   Dr. [name]: gastric erythema, erosion, mild chronic inflammation on path    EXCISION OF BREAST BIOPSY Right 10/12/2020   Procedure: EXCISION OF RIGHT BREAST BIOPSY;  Surgeon: Aviva Signs, MD;  Location: [facility];  Service: General;  Laterality: Right;   LAPAROSCOPIC APPENDECTOMY  09/29/2011   Procedure: APPENDECTOMY LAPAROSCOPIC;  Surgeon: Jamesetta So, MD;  Location: [facility];  Service: General;;  incidental appendectomy   LEFT HEART CATH AND CORONARY ANGIOGRAPHY N/A 02/28/2019   Procedure: LEFT HEART CATH AND CORONARY ANGIOGRAPHY;  Surgeon: Jettie Booze, MD;  Location: [facility]  INVASIVE CV LAB;  Service: Cardiovascular;  Laterality: N/A;   MASTECTOMY MODIFIED RADICAL Right 02/18/2020   Procedure: MASTECTOMY MODIFIED RADICAL;  Surgeon: Aviva Signs, MD;  Location: [facility];  Service: General;  Laterality: Right;   MASTECTOMY, PARTIAL Right 07/13/2020   Procedure: RIGHT PARTIAL MASTECTOMY;  Surgeon: Aviva Signs, MD;  Location: [facility];  Service: General;  Laterality: Right;   PARTIAL MASTECTOMY WITH NEEDLE LOCALIZATION AND AXILLARY  SENTINEL LYMPH NODE BX Right 09/18/2018   Procedure: RIGHT PARTIAL MASTECTOMY AFTER NEEDLE LOCALIZATION, SENTINEL LYMPH NODE BIOPSY RIGHT AXILLA;  Surgeon: Aviva Signs, MD;  Location: [facility];  Service: General;  Laterality: Right;   SIMPLE MASTECTOMY WITH AXILLARY SENTINEL NODE BIOPSY Left 06/15/2020   Procedure: LEFT SIMPLE MASTECTOMY;  Surgeon: Aviva Signs, MD;  Location: [facility];  Service: General;  Laterality: Left;    Prior to Admission medications   Medication Sig Start Date End Date Taking? Authorizing Provider  acetaminophen (TYLENOL) 500 MG tablet Take by mouth. 11/10/20 11/09/21 Yes [provider]  amLODipine (NORVASC) 10 MG tablet TAKE 1 TABLET BY MOUTH AT BEDTIME 11/25/20  Yes [name], MD  anastrozole (ARIMIDEX) 1 MG tablet TAKE 1 TABLET(1 MG) BY MOUTH DAILY 05/23/21  Yes Derek Jack, MD  aspirin EC 81 MG tablet Take 81 mg by mouth daily.   Yes [provider]  atorvastatin (LIPITOR) 20 MG tablet TAKE 1 TABLET DAILY (NEEDS OFFICE VISIT) 06/13/21  Yes [name], MD  B Complex-C-Folic Acid (DIALYVITE 983) 0.8 MG TABS Take 1 tablet by mouth daily. 01/22/18  Yes [provider]  cinacalcet (SENSIPAR) 30 MG tablet Take 2 tablets (60 mg total) by mouth daily. 06/13/18  Yes [name], MD  DULoxetine (CYMBALTA) 30 MG capsule TAKE 1 CAPSULE(30 MG) BY MOUTH DAILY 02/07/21  Yes [name], MD  gabapentin (NEURONTIN) 300 MG capsule TAKE 1 CAPSULE(300 MG) BY MOUTH AT BEDTIME 12/13/20  Yes [name], MD  lanthanum (FOSRENOL) 1000 MG chewable tablet Chew 2,000-3,000 mg by mouth See admin instructions. Take 3 tablets (3000 mg) by mouth with meals and take 2 tablets (2000 mg) with snacks 10/16/19  Yes [provider]  nebivolol (BYSTOLIC) 10 MG tablet TAKE 1 TABLET IN THE MORNING AND AT BEDTIME 04/21/21  Yes [name], MD  pantoprazole (PROTONIX) 40 MG tablet TAKE 1 TABLET(40 MG) BY MOUTH DAILY 11/25/20  Yes [name], MD  HYDROcodone-acetaminophen (NORCO/VICODIN) 5-325 MG tablet Take 1 tablet by mouth every 6 (six) hours as needed. 06/02/21   Evalee Jefferson, PA-C  lidocaine-prilocaine (EMLA) cream Apply 1 application topically every Monday, Wednesday, and Friday with hemodialysis. 09/24/18   [provider]    Allergies as of 05/25/2021   (No Known Allergies)    Family History  Problem Relation Age of Onset   Hypertension Mother    Coronary artery disease Mother    Diabetes Mother    Hypertension Sister    Coronary artery disease Sister    Hypertension Brother    Heart attack Father    Hypertension Son    Heart attack Maternal Aunt    Hypertension Maternal Aunt    Diabetes Maternal Aunt    Heart attack Maternal Uncle    Hypertension Maternal Uncle    Diabetes Maternal Uncle    Heart attack Paternal Aunt    Hypertension Paternal Aunt    Diabetes Paternal Aunt    Heart attack Paternal Uncle    Hypertension Paternal Uncle  Diabetes Paternal Uncle    Heart attack Maternal Grandmother    Heart attack Maternal Grandfather    Heart attack Paternal Grandmother    Heart attack Paternal Grandfather    Colon cancer Neg Hx     Social History   Socioeconomic History   Marital status: Married    Spouse name: Not on file   Number of children: Not on file   Years of education: Not on file   Highest education level: Not on file  Occupational History   Occupation: Merchandiser, 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   Also has to get items from high shelves-causes achy pain in shoulder area      Married.    Children are grown, out of house.   Social Determinants of Health   Financial Resource Strain: Not on file  Food Insecurity: Not on file  Transportation Needs: Not on file  Physical Activity: Not on file  Stress: Not on file  Social Connections: Not on file  Intimate Partner Violence: Not At Risk   Fear of Current or Ex-Partner: No   Emotionally Abused: No   Physically Abused: No   Sexually Abused: No    Review of Systems: See HPI, otherwise negative ROS  Physical Exam: BP 139/78   Pulse 78   Temp 97.9 F (36.6 C) (Oral)   Resp 18   SpO2 100%  General:   Alert,  Well-developed, well-nourished, pleasant and cooperative in NAD Neck:  Supple; no masses or thyromegaly. No significant cervical adenopathy. Lungs:  Clear throughout to auscultation.   No wheezes, crackles, or rhonchi. No acute distress. Heart:  Regular rate and rhythm; no murmurs, clicks, rubs,  or gallops. Abdomen: Non-distended, normal bowel sounds.  Soft and nontender without appreciable mass or hepatosplenomegaly.  Pulses:  Normal pulses noted. Extremities:  Without clubbing or edema.  Impression/Plan:    60 year old lady here for further evaluation of left upper quadrant abdominal pain associated with reflux and paper hematochezia in the setting of recurrent C. difficile.  I have offered the patient an EGD and a colonoscopy today.  The risks, benefits, limitations, imponderables and alternatives regarding both EGD and colonoscopy have been reviewed with the patient. Questions have been answered. All parties agreeable.       Notice: This dictation was prepared with Dragon dictation along with smaller phrase technology. Any transcriptional errors that result from this process are unintentional and may not be corrected upon review.

## 2021-06-16 NOTE — Op Note (Signed)
[facility] Patient Name: Megan [name] Procedure Date: 06/16/2021 10:14 AM MRN: [id number] Date of Birth: November 25, 1960 Attending MD: Norvel Richards , MD CSN: [id number] Age: 60 Admit Type: Outpatient Procedure:                Colonoscopy Indications:              Hematochezia Providers:                Norvel Richards, MD, Janeece Riggers, RN, Randa Spike, Technician Referring MD:              Medicines:                Propofol per Anesthesia Complications:            No immediate complications. Estimated Blood Loss:     Estimated blood loss: none. Procedure:                Pre-Anesthesia Assessment:                           - Prior to the procedure, a History and Physical                            was performed, and patient medications and                            allergies were reviewed. The patient's tolerance of                            previous anesthesia was also reviewed. The risks                            and benefits of the procedure and the sedation                            options and risks were discussed with the patient.                            All questions were answered, and informed consent                            was obtained. Prior Anticoagulants: The patient has                            taken no previous anticoagulant or antiplatelet                            agents. ASA Grade Assessment: III - A patient with                            severe systemic disease. After reviewing the risks                            and  benefits, the patient was deemed in                            satisfactory condition to undergo the procedure.                           After obtaining informed consent, the colonoscope                            was passed under direct vision. Throughout the                            procedure, the patient's blood pressure, pulse, and                            oxygen saturations were  monitored continuously. The                            [id number]) scope was introduced through the                            anus and advanced to the the cecum, identified by                            appendiceal orifice and ileocecal valve. The                            colonoscopy was performed without difficulty. The                            patient tolerated the procedure well. The quality                            of the bowel preparation was adequate. Scope In: 10:15:57 AM Scope Out: 10:26:35 AM Scope Withdrawal Time: 0 hours 6 minutes 31 seconds  Total Procedure Duration: 0 hours 10 minutes 38 seconds  Findings:      The perianal and digital rectal examinations were normal.      Non-bleeding internal hemorrhoids were found during retroflexion. The       hemorrhoids were moderate, large and Grade I (internal hemorrhoids that       do not prolapse). site of prior hemorrhoid bands with scarring apparent.      The exam was otherwise without abnormality on direct and retroflexion       views. Impression:               - Non-bleeding internal hemorrhoids.                           - The examination was otherwise normal on direct                            and retroflexion views.                           - No specimens collected. Moderate Sedation:  Moderate (conscious) sedation was personally administered by an       anesthesia professional. The following parameters were monitored: oxygen       saturation, heart rate, blood pressure, respiratory rate, EKG, adequacy       of pulmonary ventilation, and response to care. Recommendation:           - Patient has a contact number available for                            emergencies. The signs and symptoms of potential                            delayed complications were discussed with the                            patient. Return to normal activities tomorrow.                            Written discharge instructions  were provided to the                            patient.                           - Advance diet as tolerated.                           - Continue present medications.                           - Repeat colonoscopy in 10 years for screening                            purposes.                           - Return to GI office in 6 weeks for possible                            hemorrhoid banding. Procedure Code(s):        --- Professional ---                           [phone], Colonoscopy, flexible; diagnostic, including                            collection of specimen(s) by brushing or washing,                            when performed (separate procedure) Diagnosis Code(s):        --- Professional ---                           K64.0, First degree hemorrhoids                           K92.1, Melena (includes Hematochezia)  CPT copyright 2019 American Medical Association. All rights reserved. The codes documented in this report are preliminary and upon coder review may  be revised to meet current compliance requirements. [name]. [name], MD Norvel Richards, MD 06/16/2021 10:39:02 AM This report has been signed electronically. Number of Addenda: 0

## 2021-06-17 ENCOUNTER — Encounter: Payer: Self-pay | Admitting: Internal Medicine

## 2021-06-17 DIAGNOSIS — E1129 Type 2 diabetes mellitus with other diabetic kidney complication: Secondary | ICD-10-CM | POA: Diagnosis not present

## 2021-06-17 DIAGNOSIS — N186 End stage renal disease: Secondary | ICD-10-CM | POA: Diagnosis not present

## 2021-06-17 DIAGNOSIS — D631 Anemia in chronic kidney disease: Secondary | ICD-10-CM | POA: Diagnosis not present

## 2021-06-17 DIAGNOSIS — N2581 Secondary hyperparathyroidism of renal origin: Secondary | ICD-10-CM | POA: Diagnosis not present

## 2021-06-17 DIAGNOSIS — Z992 Dependence on renal dialysis: Secondary | ICD-10-CM | POA: Diagnosis not present

## 2021-06-17 DIAGNOSIS — E119 Type 2 diabetes mellitus without complications: Secondary | ICD-10-CM | POA: Diagnosis not present

## 2021-06-17 LAB — SURGICAL PATHOLOGY

## 2021-06-17 NOTE — Anesthesia Postprocedure Evaluation (Signed)
Anesthesia Post Note  Patient: FRYDA MOLENDA  Procedure(s) Performed: COLONOSCOPY WITH PROPOFOL ESOPHAGOGASTRODUODENOSCOPY (EGD) WITH PROPOFOL POLYPECTOMY  Patient location during evaluation: Phase II Anesthesia Type: General Level of consciousness: awake Pain management: pain level controlled Vital Signs Assessment: post-procedure vital signs reviewed and stable Respiratory status: spontaneous breathing and respiratory function stable Cardiovascular status: blood pressure returned to baseline and stable Postop Assessment: no headache and no apparent nausea or vomiting Anesthetic complications: no Comments: Late entry   No notable events documented.   Last Vitals:  Vitals:   06/16/21 0842 06/16/21 1035  BP: 139/78 130/70  Pulse: 78 80  Resp: 18 18  Temp: 36.6 C (!) 36.4 C  SpO2: 100% 100%    Last Pain:  Vitals:   06/16/21 1035  TempSrc: Axillary  PainSc: [place]

## 2021-06-20 DIAGNOSIS — N2581 Secondary hyperparathyroidism of renal origin: Secondary | ICD-10-CM | POA: Diagnosis not present

## 2021-06-20 DIAGNOSIS — N186 End stage renal disease: Secondary | ICD-10-CM | POA: Diagnosis not present

## 2021-06-20 DIAGNOSIS — Z992 Dependence on renal dialysis: Secondary | ICD-10-CM | POA: Diagnosis not present

## 2021-06-20 DIAGNOSIS — E119 Type 2 diabetes mellitus without complications: Secondary | ICD-10-CM | POA: Diagnosis not present

## 2021-06-20 DIAGNOSIS — D631 Anemia in chronic kidney disease: Secondary | ICD-10-CM | POA: Diagnosis not present

## 2021-06-20 DIAGNOSIS — E1129 Type 2 diabetes mellitus with other diabetic kidney complication: Secondary | ICD-10-CM | POA: Diagnosis not present

## 2021-06-20 NOTE — Telephone Encounter (Addendum)
She should not have an MRI until xray documents that the clip has passed. This can take up to several weeks.  She will need to have abdominal xray prior to pursuing MRI. As she just had clip placed several days ago, I suspect has not passed. Recommend postponing MRI if she can by a week or so.   We did not order MRI. She needs to call and ask this be rescheduled. We can order abdominal xray prior to MRI once we know the new date.

## 2021-06-20 NOTE — Addendum Note (Signed)
Addended by: Hassan Rowan on: 06/20/2021 09:57 AM   Modules accepted: Orders

## 2021-06-20 NOTE — Telephone Encounter (Signed)
Order placed for abd x-ray. Called and informed pt order was placed. She may walk in to [facility] radiology to have x-ray done few days prior to MRI.

## 2021-06-20 NOTE — Telephone Encounter (Signed)
Pt was made aware and verbalized understanding. Pt will contact ordering provider to have the MRI reordered at a later date. Pt stated that she did cancel the MRI.

## 2021-06-21 ENCOUNTER — Encounter (HOSPITAL_COMMUNITY): Payer: Self-pay | Admitting: Internal Medicine

## 2021-06-22 DIAGNOSIS — D631 Anemia in chronic kidney disease: Secondary | ICD-10-CM | POA: Diagnosis not present

## 2021-06-22 DIAGNOSIS — E119 Type 2 diabetes mellitus without complications: Secondary | ICD-10-CM | POA: Diagnosis not present

## 2021-06-22 DIAGNOSIS — E1129 Type 2 diabetes mellitus with other diabetic kidney complication: Secondary | ICD-10-CM | POA: Diagnosis not present

## 2021-06-22 DIAGNOSIS — Z992 Dependence on renal dialysis: Secondary | ICD-10-CM | POA: Diagnosis not present

## 2021-06-22 DIAGNOSIS — N186 End stage renal disease: Secondary | ICD-10-CM | POA: Diagnosis not present

## 2021-06-22 DIAGNOSIS — N2581 Secondary hyperparathyroidism of renal origin: Secondary | ICD-10-CM | POA: Diagnosis not present

## 2021-06-23 ENCOUNTER — Ambulatory Visit (HOSPITAL_COMMUNITY): Admission: RE | Admit: 2021-06-23 | Payer: Medicare Other | Source: Ambulatory Visit

## 2021-06-23 ENCOUNTER — Encounter (HOSPITAL_COMMUNITY): Payer: Self-pay

## 2021-06-24 DIAGNOSIS — E1129 Type 2 diabetes mellitus with other diabetic kidney complication: Secondary | ICD-10-CM | POA: Diagnosis not present

## 2021-06-24 DIAGNOSIS — N186 End stage renal disease: Secondary | ICD-10-CM | POA: Diagnosis not present

## 2021-06-24 DIAGNOSIS — D631 Anemia in chronic kidney disease: Secondary | ICD-10-CM | POA: Diagnosis not present

## 2021-06-24 DIAGNOSIS — Z992 Dependence on renal dialysis: Secondary | ICD-10-CM | POA: Diagnosis not present

## 2021-06-24 DIAGNOSIS — N2581 Secondary hyperparathyroidism of renal origin: Secondary | ICD-10-CM | POA: Diagnosis not present

## 2021-06-24 DIAGNOSIS — E119 Type 2 diabetes mellitus without complications: Secondary | ICD-10-CM | POA: Diagnosis not present

## 2021-06-27 DIAGNOSIS — E1129 Type 2 diabetes mellitus with other diabetic kidney complication: Secondary | ICD-10-CM | POA: Diagnosis not present

## 2021-06-27 DIAGNOSIS — D631 Anemia in chronic kidney disease: Secondary | ICD-10-CM | POA: Diagnosis not present

## 2021-06-27 DIAGNOSIS — Z992 Dependence on renal dialysis: Secondary | ICD-10-CM | POA: Diagnosis not present

## 2021-06-27 DIAGNOSIS — E119 Type 2 diabetes mellitus without complications: Secondary | ICD-10-CM | POA: Diagnosis not present

## 2021-06-27 DIAGNOSIS — N186 End stage renal disease: Secondary | ICD-10-CM | POA: Diagnosis not present

## 2021-06-27 DIAGNOSIS — N2581 Secondary hyperparathyroidism of renal origin: Secondary | ICD-10-CM | POA: Diagnosis not present

## 2021-06-29 DIAGNOSIS — Z992 Dependence on renal dialysis: Secondary | ICD-10-CM | POA: Diagnosis not present

## 2021-06-29 DIAGNOSIS — D631 Anemia in chronic kidney disease: Secondary | ICD-10-CM | POA: Diagnosis not present

## 2021-06-29 DIAGNOSIS — N186 End stage renal disease: Secondary | ICD-10-CM | POA: Diagnosis not present

## 2021-06-29 DIAGNOSIS — N2581 Secondary hyperparathyroidism of renal origin: Secondary | ICD-10-CM | POA: Diagnosis not present

## 2021-06-29 DIAGNOSIS — E119 Type 2 diabetes mellitus without complications: Secondary | ICD-10-CM | POA: Diagnosis not present

## 2021-06-29 DIAGNOSIS — E1129 Type 2 diabetes mellitus with other diabetic kidney complication: Secondary | ICD-10-CM | POA: Diagnosis not present

## 2021-07-01 DIAGNOSIS — Z992 Dependence on renal dialysis: Secondary | ICD-10-CM | POA: Diagnosis not present

## 2021-07-01 DIAGNOSIS — E1129 Type 2 diabetes mellitus with other diabetic kidney complication: Secondary | ICD-10-CM | POA: Diagnosis not present

## 2021-07-01 DIAGNOSIS — N186 End stage renal disease: Secondary | ICD-10-CM | POA: Diagnosis not present

## 2021-07-01 DIAGNOSIS — N2581 Secondary hyperparathyroidism of renal origin: Secondary | ICD-10-CM | POA: Diagnosis not present

## 2021-07-01 DIAGNOSIS — D631 Anemia in chronic kidney disease: Secondary | ICD-10-CM | POA: Diagnosis not present

## 2021-07-01 DIAGNOSIS — Z20828 Contact with and (suspected) exposure to other viral communicable diseases: Secondary | ICD-10-CM | POA: Diagnosis not present

## 2021-07-01 DIAGNOSIS — E119 Type 2 diabetes mellitus without complications: Secondary | ICD-10-CM | POA: Diagnosis not present

## 2021-07-03 ENCOUNTER — Other Ambulatory Visit: Payer: Self-pay | Admitting: Family Medicine

## 2021-07-04 DIAGNOSIS — D631 Anemia in chronic kidney disease: Secondary | ICD-10-CM | POA: Diagnosis not present

## 2021-07-04 DIAGNOSIS — N186 End stage renal disease: Secondary | ICD-10-CM | POA: Diagnosis not present

## 2021-07-04 DIAGNOSIS — Z992 Dependence on renal dialysis: Secondary | ICD-10-CM | POA: Diagnosis not present

## 2021-07-04 DIAGNOSIS — E119 Type 2 diabetes mellitus without complications: Secondary | ICD-10-CM | POA: Diagnosis not present

## 2021-07-04 DIAGNOSIS — E1129 Type 2 diabetes mellitus with other diabetic kidney complication: Secondary | ICD-10-CM | POA: Diagnosis not present

## 2021-07-04 DIAGNOSIS — N2581 Secondary hyperparathyroidism of renal origin: Secondary | ICD-10-CM | POA: Diagnosis not present

## 2021-07-05 DIAGNOSIS — D631 Anemia in chronic kidney disease: Secondary | ICD-10-CM | POA: Diagnosis not present

## 2021-07-05 DIAGNOSIS — N186 End stage renal disease: Secondary | ICD-10-CM | POA: Diagnosis not present

## 2021-07-05 DIAGNOSIS — E1129 Type 2 diabetes mellitus with other diabetic kidney complication: Secondary | ICD-10-CM | POA: Diagnosis not present

## 2021-07-05 DIAGNOSIS — E119 Type 2 diabetes mellitus without complications: Secondary | ICD-10-CM | POA: Diagnosis not present

## 2021-07-05 DIAGNOSIS — Z992 Dependence on renal dialysis: Secondary | ICD-10-CM | POA: Diagnosis not present

## 2021-07-05 DIAGNOSIS — N2581 Secondary hyperparathyroidism of renal origin: Secondary | ICD-10-CM | POA: Diagnosis not present

## 2021-07-06 DIAGNOSIS — E1129 Type 2 diabetes mellitus with other diabetic kidney complication: Secondary | ICD-10-CM | POA: Diagnosis not present

## 2021-07-06 DIAGNOSIS — D631 Anemia in chronic kidney disease: Secondary | ICD-10-CM | POA: Diagnosis not present

## 2021-07-06 DIAGNOSIS — E119 Type 2 diabetes mellitus without complications: Secondary | ICD-10-CM | POA: Diagnosis not present

## 2021-07-06 DIAGNOSIS — N186 End stage renal disease: Secondary | ICD-10-CM | POA: Diagnosis not present

## 2021-07-06 DIAGNOSIS — Z992 Dependence on renal dialysis: Secondary | ICD-10-CM | POA: Diagnosis not present

## 2021-07-06 DIAGNOSIS — N2581 Secondary hyperparathyroidism of renal origin: Secondary | ICD-10-CM | POA: Diagnosis not present

## 2021-07-08 ENCOUNTER — Other Ambulatory Visit: Payer: Self-pay

## 2021-07-08 DIAGNOSIS — Z7982 Long term (current) use of aspirin: Secondary | ICD-10-CM | POA: Insufficient documentation

## 2021-07-08 DIAGNOSIS — Z853 Personal history of malignant neoplasm of breast: Secondary | ICD-10-CM | POA: Insufficient documentation

## 2021-07-08 DIAGNOSIS — E1122 Type 2 diabetes mellitus with diabetic chronic kidney disease: Secondary | ICD-10-CM | POA: Insufficient documentation

## 2021-07-08 DIAGNOSIS — I2510 Atherosclerotic heart disease of native coronary artery without angina pectoris: Secondary | ICD-10-CM | POA: Insufficient documentation

## 2021-07-08 DIAGNOSIS — I5032 Chronic diastolic (congestive) heart failure: Secondary | ICD-10-CM | POA: Diagnosis not present

## 2021-07-08 DIAGNOSIS — Z79899 Other long term (current) drug therapy: Secondary | ICD-10-CM | POA: Diagnosis not present

## 2021-07-08 DIAGNOSIS — N186 End stage renal disease: Secondary | ICD-10-CM | POA: Diagnosis not present

## 2021-07-08 DIAGNOSIS — M79671 Pain in right foot: Secondary | ICD-10-CM | POA: Diagnosis not present

## 2021-07-08 DIAGNOSIS — E039 Hypothyroidism, unspecified: Secondary | ICD-10-CM | POA: Diagnosis not present

## 2021-07-08 DIAGNOSIS — Z992 Dependence on renal dialysis: Secondary | ICD-10-CM | POA: Insufficient documentation

## 2021-07-08 DIAGNOSIS — I132 Hypertensive heart and chronic kidney disease with heart failure and with stage 5 chronic kidney disease, or end stage renal disease: Secondary | ICD-10-CM | POA: Insufficient documentation

## 2021-07-08 NOTE — ED Triage Notes (Signed)
Pt with diabetic neuropathy and here with c/o shooting pain in R foot that started when she was getting ready for bed.

## 2021-07-09 ENCOUNTER — Encounter (HOSPITAL_COMMUNITY): Payer: Self-pay | Admitting: Emergency Medicine

## 2021-07-09 ENCOUNTER — Emergency Department (HOSPITAL_COMMUNITY)
Admission: EM | Admit: 2021-07-09 | Discharge: 2021-07-09 | Disposition: A | Discharge status: Home / Self Care | Payer: Medicare Other | Attending: Emergency Medicine | Admitting: Emergency Medicine

## 2021-07-09 DIAGNOSIS — Z992 Dependence on renal dialysis: Secondary | ICD-10-CM | POA: Diagnosis not present

## 2021-07-09 DIAGNOSIS — N2581 Secondary hyperparathyroidism of renal origin: Secondary | ICD-10-CM | POA: Diagnosis not present

## 2021-07-09 DIAGNOSIS — N186 End stage renal disease: Secondary | ICD-10-CM | POA: Diagnosis not present

## 2021-07-09 DIAGNOSIS — D631 Anemia in chronic kidney disease: Secondary | ICD-10-CM | POA: Diagnosis not present

## 2021-07-09 DIAGNOSIS — M79671 Pain in right foot: Secondary | ICD-10-CM | POA: Diagnosis not present

## 2021-07-09 DIAGNOSIS — E119 Type 2 diabetes mellitus without complications: Secondary | ICD-10-CM | POA: Diagnosis not present

## 2021-07-09 DIAGNOSIS — E1129 Type 2 diabetes mellitus with other diabetic kidney complication: Secondary | ICD-10-CM | POA: Diagnosis not present

## 2021-07-09 MED ORDER — OXYCODONE-ACETAMINOPHEN 5-325 MG PO TABS
2.0000 | ORAL_TABLET | Freq: Once | ORAL | Status: AC
  Administered 2021-07-09: 2 via ORAL
  Filled 2021-07-09: qty 2

## 2021-07-09 NOTE — ED Provider Notes (Signed)
[facility] EMERGENCY DEPARTMENT Provider Note   CSN: [id number] Arrival date & time: 07/08/21  2336     History Chief Complaint  Patient presents with   Pain R foot    Megan [name] is a 60 y.o. female.  Acute onset of right 1st MCP joint pain at bedtime. Came here. Improved but intermittently still there. No h/o same. Has h/o neuropathy but not like this. No injury. No worse with walking. No worse with movement. No recent illnesses. No other associated symptoms. At this time is pain free.        Past Medical History:  Diagnosis Date   (HFpEF) heart failure with preserved ejection fraction ([place])    a. 01/2019 Echo: EF 55-60%, mild conc LVH. DD.  Torn MV chordae.   Anemia    Atypical chest pain    a. 08/2018 MV: EF 59%, no ischemia; b. 02/2019 Cath: nonobs dzs.   Blood transfusion without reported diagnosis    Breast cancer ([place]) 10/12/2020   Cataract    ESRD (end stage renal disease) on dialysis [facility])    a. HD T, T, S   Essential hypertension, benign    GERD (gastroesophageal reflux disease)    Headache    Hemorrhoids    Mixed hyperlipidemia    Morbid obesity (HCC)    Non-obstructive CAD (coronary artery disease)    a. 02/2019 CathL LM nl, LAD 40m, LCX nl, RCA 25p, EF 55-65%.   PONV (postoperative nausea and vomiting)    S/P colonoscopy Jan 2011   Dr. Benson Norway: sessile polyp (benign lymphoid), large hemorrhoids, repeat 5-10 years   Temporal arteritis ([place])    Type 2 diabetes mellitus ([place])    Wears glasses     Patient Active Problem List   Diagnosis Date Noted   LUQ pain 05/11/2021   Fatigue 05/03/2021   C. difficile colitis 02/15/2021   Dizziness    Breast mass, right    History of right breast cancer    Lobar pneumonia, unspecified organism ([place]) 07/03/2020   Pleural effusion, right    S/P left mastectomy 06/15/2020   Allergy, unspecified, initial encounter 03/03/2020   Anaphylactic shock, unspecified, initial encounter 03/03/2020   Breast cancer ([place])  02/18/2020   History of modified radical mastectomy of right breast 02/18/2020   S/P mastectomy, right 02/18/2020   ESRD (end stage renal disease) on dialysis ([place])    Grade III hemorrhoids 07/31/2019   Fluid overload, unspecified 05/24/2019   Anaphylactic reaction due to adverse effect of correct drug or medicament properly administered, initial encounter 04/30/2019   Unspecified protein-calorie malnutrition ([place]) 03/03/2019   Hypertensive heart disease with heart failure ([place]) 03/01/2019   Atherosclerotic heart disease of native coronary artery without angina pectoris 03/01/2019   Chest pain 02/28/2019   Chest pain with high risk for cardiac etiology 02/28/2019   Elevated troponin 02/03/2019   ESRD (end stage renal disease) ([place]) 41/74/0814   Acute diastolic CHF (congestive heart failure) ([place]) 02/03/2019   Hypokalemia 02/03/2019   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[place])    Hypocalcemia 09/03/2018   Hypothyroidism, unspecified 09/21/2017   Anemia in chronic kidney disease 04/12/2017   Iron deficiency anemia, unspecified 04/12/2017   Abnormal results of kidney function studies 04/11/2017   Acidosis 04/11/2017   Anemia, unspecified 04/11/2017   Coagulation defect, unspecified ([place]) 04/11/2017   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, with stage 5 chronic kidney disease, or end stage renal disease ([place]) 04/11/2017  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 04/11/2017   Other disorders of phosphorus metabolism 04/11/2017   Pain, unspecified 04/11/2017   Proteinuria, unspecified 04/11/2017   Pruritus, unspecified 04/11/2017   Unspecified kidney failure 04/11/2017   Encounter for prophylactic removal of breast 04/04/2017   Rectal bleeding 06/08/2016   Vaginal discharge 12/30/2015   Abdominal pain, epigastric 11/26/2013   GERD (gastroesophageal reflux disease)    Type 2 diabetes mellitus ([place])    CKD  (chronic kidney disease) stage 3, GFR 30-59 ml/min (HCC)    External hemorrhoid 08/01/2011   Hematochezia 07/13/2011   Dyspnea 04/21/2010   Atypical chest pain 04/21/2010   Mixed hyperlipidemia 03/11/2008   OBESITY 03/11/2008   Essential hypertension, benign 03/11/2008    Past Surgical History:  Procedure Laterality Date   ABDOMINAL HYSTERECTOMY     APPENDECTOMY     ARTERY BIOPSY N/A 05/09/2018   Procedure: RIGHT TEMPORAL ARTERY BIOPSY;  Surgeon: Judeth Horn, MD;  Location: [place];  Service: General;  Laterality: N/A;   BREAST BIOPSY Right 06/15/2020   Procedure: RIGHT BREAST BIOPSY;  Surgeon: Aviva Signs, MD;  Location: [facility];  Service: General;  Laterality: Right;   CATARACT EXTRACTION W/PHACO Left 02/09/2017   Procedure: CATARACT EXTRACTION PHACO AND INTRAOCULAR LENS PLACEMENT LEFT EYE;  Surgeon: Tonny Branch, MD;  Location: [facility];  Service: Ophthalmology;  Laterality: Left;  CDE: 4.89   CATARACT EXTRACTION W/PHACO Right 06/04/2017   Procedure: CATARACT EXTRACTION PHACO AND INTRAOCULAR LENS PLACEMENT (IOC);  Surgeon: Tonny Branch, MD;  Location: [facility];  Service: Ophthalmology;  Laterality: Right;  CDE: 4.12   CHOLECYSTECTOMY  09/29/2011   Procedure: LAPAROSCOPIC CHOLECYSTECTOMY;  Surgeon: Jamesetta So, MD;  Location: [facility];  Service: General;  Laterality: N/A;   COLONOSCOPY  08/2009   Dr. Benson Norway: sessile polyp (benign lymphoid), large hemorrhoids, repeat 5-10 years   COLONOSCOPY N/A 06/12/2016   prominent hemorrhoids   COLONOSCOPY WITH PROPOFOL N/A 06/16/2021   Procedure: COLONOSCOPY WITH PROPOFOL;  Surgeon: Daneil Dolin, MD;  [facility];  Service: Endoscopy;  Laterality: N/A;  9:30am (dialysis pt)   ESOPHAGOGASTRODUODENOSCOPY  09/05/2011   [id number] hiatal hernia; remainder of exam normal. No explanation for patient's abdominal pain with today's examination   ESOPHAGOGASTRODUODENOSCOPY N/A 12/17/2013   Dr. [name]: gastric erythema, erosion, mild chronic  inflammation on path    ESOPHAGOGASTRODUODENOSCOPY (EGD) WITH PROPOFOL N/A 06/16/2021   Procedure: ESOPHAGOGASTRODUODENOSCOPY (EGD) WITH PROPOFOL;  Surgeon: Daneil Dolin, MD;  [facility];  Service: Endoscopy;  Laterality: N/A;   EXCISION OF BREAST BIOPSY Right 10/12/2020   Procedure: EXCISION OF RIGHT BREAST BIOPSY;  Surgeon: Aviva Signs, MD;  Location: [facility];  Service: General;  Laterality: Right;   LAPAROSCOPIC APPENDECTOMY  09/29/2011   Procedure: APPENDECTOMY LAPAROSCOPIC;  Surgeon: Jamesetta So, MD;  Location: [facility];  Service: General;;  incidental appendectomy   LEFT HEART CATH AND CORONARY ANGIOGRAPHY N/A 02/28/2019   Procedure: LEFT HEART CATH AND CORONARY ANGIOGRAPHY;  Surgeon: Jettie Booze, MD;  Location: [facility];  Service: Cardiovascular;  Laterality: N/A;   MASTECTOMY MODIFIED RADICAL Right 02/18/2020   Procedure: MASTECTOMY MODIFIED RADICAL;  Surgeon: Aviva Signs, MD;  Location: [facility];  Service: General;  Laterality: Right;   MASTECTOMY, PARTIAL Right 07/13/2020   Procedure: RIGHT PARTIAL MASTECTOMY;  Surgeon: Aviva Signs, MD;  Location: [facility];  Service: General;  Laterality: Right;   PARTIAL MASTECTOMY WITH NEEDLE LOCALIZATION AND AXILLARY SENTINEL LYMPH NODE BX Right  09/18/2018   Procedure: RIGHT PARTIAL MASTECTOMY AFTER NEEDLE LOCALIZATION, SENTINEL LYMPH NODE BIOPSY RIGHT AXILLA;  Surgeon: Aviva Signs, MD;  Location: [facility];  Service: General;  Laterality: Right;   POLYPECTOMY  06/16/2021   Procedure: POLYPECTOMY;  Surgeon: Daneil Dolin, MD;  [facility];  Service: Endoscopy;;   SIMPLE MASTECTOMY WITH AXILLARY SENTINEL NODE BIOPSY Left 06/15/2020   Procedure: LEFT SIMPLE MASTECTOMY;  Surgeon: Aviva Signs, MD;  Location: [facility];  Service: General;  Laterality: Left;     OB History     Gravida      Para      Term      Preterm      AB      Living  1      SAB      IAB      Ectopic      Multiple       Live Births              Family History  Problem Relation Age of Onset   Hypertension Mother    Coronary artery disease Mother    Diabetes Mother    Hypertension Sister    Coronary artery disease Sister    Hypertension Brother    Heart attack Father    Hypertension Son    Heart attack Maternal Aunt    Hypertension Maternal Aunt    Diabetes Maternal Aunt    Heart attack Maternal Uncle    Hypertension Maternal Uncle    Diabetes Maternal Uncle    Heart attack Paternal Aunt    Hypertension Paternal Aunt    Diabetes Paternal Aunt    Heart attack Paternal Uncle    Hypertension Paternal Uncle    Diabetes Paternal Uncle    Heart attack Maternal Grandmother    Heart attack Maternal Grandfather    Heart attack Paternal Grandmother    Heart attack Paternal Grandfather    Colon cancer Neg Hx     Social History   Tobacco Use   Smoking status: Never   Smokeless tobacco: Never  Vaping Use   Vaping Use: Never used  Substance Use Topics   Alcohol use: No   Drug use: No    Home Medications Prior to Admission medications   Medication Sig Start Date End Date Taking? Authorizing Provider  acetaminophen (TYLENOL) 500 MG tablet Take by mouth. 11/10/20 11/09/21  [provider]  amLODipine (NORVASC) 10 MG tablet TAKE 1 TABLET BY MOUTH AT BEDTIME 11/25/20   [name], MD  anastrozole (ARIMIDEX) 1 MG tablet TAKE 1 TABLET(1 MG) BY MOUTH DAILY 05/23/21   Derek Jack, MD  aspirin EC 81 MG tablet Take 81 mg by mouth daily.    [provider]  atorvastatin (LIPITOR) 20 MG tablet TAKE 1 TABLET DAILY (NEEDS OFFICE VISIT) 06/13/21   [name], MD  B Complex-C-Folic Acid (DIALYVITE 147) 0.8 MG TABS Take 1 tablet by mouth daily. 01/22/18   [provider]  cinacalcet (SENSIPAR) 30 MG tablet Take 2 tablets (60 mg total) by mouth daily. 06/13/18   [name], MD  DULoxetine (CYMBALTA) 30 MG capsule TAKE 1 CAPSULE(30 MG) BY MOUTH DAILY  02/07/21   [name], MD  gabapentin (NEURONTIN) 300 MG capsule TAKE 1 CAPSULE(300 MG) BY MOUTH AT BEDTIME 07/04/21   [name], MD  HYDROcodone-acetaminophen (NORCO/VICODIN) 5-325 MG tablet Take 1 tablet by mouth every 6 (six) hours as needed. 06/02/21   Evalee Jefferson, PA-C  lanthanum (FOSRENOL) 1000 MG chewable tablet Chew 2,000-3,000 mg by mouth See admin instructions. Take 3 tablets (3000 mg) by mouth with meals and take 2 tablets (2000 mg) with snacks 10/16/19   [provider]  lidocaine-prilocaine (EMLA) cream Apply 1 application topically every Monday, Wednesday, and Friday with hemodialysis. 09/24/18   [provider]  nebivolol (BYSTOLIC) 10 MG tablet TAKE 1 TABLET IN THE MORNING AND AT BEDTIME 04/21/21   [name], MD  pantoprazole (PROTONIX) 40 MG tablet TAKE 1 TABLET(40 MG) BY MOUTH DAILY 11/25/20   [name], MD    Allergies    Patient has no known allergies.  Review of Systems   Review of Systems  All other systems reviewed and are negative.  Physical Exam Updated Vital Signs BP (!) 147/79 (BP Location: Right Arm)   Pulse 92   Temp 98.2 F (36.8 C) (Oral)   Resp 18   Ht 5\' 2"  (1.575 m)   Wt 80.3 kg   SpO2 98%   BMI 32.37 kg/m   Physical Exam Vitals and nursing note reviewed.  Constitutional:      Appearance: She is well-developed.  HENT:     Head: Normocephalic and atraumatic.     Nose: Nose normal. No congestion or rhinorrhea.     Mouth/Throat:     Mouth: Mucous membranes are moist.     Pharynx: Oropharynx is clear.  Eyes:     Pupils: Pupils are equal, round, and reactive to light.  Cardiovascular:     Rate and Rhythm: Normal rate and regular rhythm.  Pulmonary:     Effort: No respiratory distress.     Breath sounds: No stridor.  Abdominal:     General: Abdomen is flat. There is no distension.  Musculoskeletal:        General: No swelling (no swelling to right first mcp) or tenderness (no ttp to right first  MCP). Normal range of motion.     Cervical back: Normal range of motion.  Skin:    General: Skin is warm and dry.     Coloration: Skin is not jaundiced or pale.  Neurological:     General: No focal deficit present.     Mental Status: She is alert.    ED Results / Procedures / Treatments   Labs (all labs ordered are listed, but only abnormal results are displayed) Labs Reviewed - No data to display  EKG None  Radiology No results found.  Procedures Procedures   Medications Ordered in ED Medications  oxyCODONE-acetaminophen (PERCOCET/ROXICET) 5-325 MG per tablet 2 tablet (has no administration in time range)    ED Course  I have reviewed the triage vital signs and the nursing notes.  Pertinent labs & imaging results that were available during my care of the patient were reviewed by me and considered in my medical decision making (see chart for details).    MDM Rules/Calculators/A&P                         No e/o infection, gout, traumatic injury. Doesn't seem c/w normal neuropathy. Pain free now. Will refer to podiatry.   Final Clinical Impression(s) / ED Diagnoses Final diagnoses:  Right foot pain    Rx / DC Orders ED Discharge Orders     None        Yocheved Depner, Corene Cornea, MD 07/09/21 0157

## 2021-07-10 ENCOUNTER — Other Ambulatory Visit: Payer: Self-pay

## 2021-07-10 ENCOUNTER — Encounter (HOSPITAL_COMMUNITY): Payer: Self-pay | Admitting: Emergency Medicine

## 2021-07-10 ENCOUNTER — Ambulatory Visit (HOSPITAL_COMMUNITY)
Admission: RE | Admit: 2021-07-10 | Discharge: 2021-07-10 | Disposition: A | Discharge status: Home / Self Care | Payer: Medicare Other | Source: Ambulatory Visit | Attending: Gastroenterology | Admitting: Gastroenterology

## 2021-07-10 ENCOUNTER — Emergency Department (HOSPITAL_COMMUNITY): Payer: Medicare Other

## 2021-07-10 ENCOUNTER — Emergency Department (HOSPITAL_COMMUNITY)
Admission: EM | Admit: 2021-07-10 | Discharge: 2021-07-10 | Disposition: A | Discharge status: Home / Self Care | Payer: Medicare Other | Attending: Emergency Medicine | Admitting: Emergency Medicine

## 2021-07-10 DIAGNOSIS — R2230 Localized swelling, mass and lump, unspecified upper limb: Secondary | ICD-10-CM | POA: Diagnosis not present

## 2021-07-10 DIAGNOSIS — Z0389 Encounter for observation for other suspected diseases and conditions ruled out: Secondary | ICD-10-CM | POA: Diagnosis not present

## 2021-07-10 DIAGNOSIS — I770 Arteriovenous fistula, acquired: Secondary | ICD-10-CM | POA: Diagnosis not present

## 2021-07-10 DIAGNOSIS — I132 Hypertensive heart and chronic kidney disease with heart failure and with stage 5 chronic kidney disease, or end stage renal disease: Secondary | ICD-10-CM | POA: Insufficient documentation

## 2021-07-10 DIAGNOSIS — Z992 Dependence on renal dialysis: Secondary | ICD-10-CM | POA: Diagnosis not present

## 2021-07-10 DIAGNOSIS — M7989 Other specified soft tissue disorders: Secondary | ICD-10-CM | POA: Diagnosis not present

## 2021-07-10 DIAGNOSIS — E1122 Type 2 diabetes mellitus with diabetic chronic kidney disease: Secondary | ICD-10-CM | POA: Insufficient documentation

## 2021-07-10 DIAGNOSIS — I5031 Acute diastolic (congestive) heart failure: Secondary | ICD-10-CM | POA: Insufficient documentation

## 2021-07-10 DIAGNOSIS — E039 Hypothyroidism, unspecified: Secondary | ICD-10-CM | POA: Insufficient documentation

## 2021-07-10 DIAGNOSIS — I2510 Atherosclerotic heart disease of native coronary artery without angina pectoris: Secondary | ICD-10-CM | POA: Diagnosis not present

## 2021-07-10 DIAGNOSIS — T189XXA Foreign body of alimentary tract, part unspecified, initial encounter: Secondary | ICD-10-CM

## 2021-07-10 DIAGNOSIS — Z853 Personal history of malignant neoplasm of breast: Secondary | ICD-10-CM | POA: Diagnosis not present

## 2021-07-10 DIAGNOSIS — I878 Other specified disorders of veins: Secondary | ICD-10-CM | POA: Diagnosis not present

## 2021-07-10 DIAGNOSIS — N186 End stage renal disease: Secondary | ICD-10-CM | POA: Diagnosis not present

## 2021-07-10 DIAGNOSIS — Z7982 Long term (current) use of aspirin: Secondary | ICD-10-CM | POA: Diagnosis not present

## 2021-07-10 DIAGNOSIS — Z9049 Acquired absence of other specified parts of digestive tract: Secondary | ICD-10-CM | POA: Diagnosis not present

## 2021-07-10 NOTE — ED Triage Notes (Signed)
Pt c/o L forearm fistula swelling that started last Thursday

## 2021-07-10 NOTE — Discharge Instructions (Addendum)
Your ultrasound is negative for any sign of fistula complication or blood clot.  I recommend following up with either Dr. Marval Regal and/or your vascular MD in [place] if this symptom persists or worsens.  It appears ok to use for dialysis.

## 2021-07-10 NOTE — ED Provider Notes (Signed)
[facility] EMERGENCY DEPARTMENT Provider Note   CSN: [id number] Arrival date & time: 07/10/21  3893     History No chief complaint on file.   Megan [name] is a 60 y.o. female with a history significant for end-stage renal disease on dialysis with a fistula in her left forearm presenting for evaluation of swelling around the site of her fistula.  This has been slowly worsening for the past week, she has been able to have the fistula accessed for dialysis, last treatment was yesterday.  She was advised to come to the ER to be assessed for possible blood clot at the site.  She denies pain at the site, there is no radiating swelling, it is localized to the fistula site only.  The history is provided by the patient.      Past Medical History:  Diagnosis Date   (HFpEF) heart failure with preserved ejection fraction ([place])    a. 01/2019 Echo: EF 55-60%, mild conc LVH. DD.  Torn MV chordae.   Anemia    Atypical chest pain    a. 08/2018 MV: EF 59%, no ischemia; b. 02/2019 Cath: nonobs dzs.   Blood transfusion without reported diagnosis    Breast cancer ([place]) 10/12/2020   Cataract    ESRD (end stage renal disease) on dialysis [facility])    a. HD T, T, S   Essential hypertension, benign    GERD (gastroesophageal reflux disease)    Headache    Hemorrhoids    Mixed hyperlipidemia    Morbid obesity (HCC)    Non-obstructive CAD (coronary artery disease)    a. 02/2019 CathL LM nl, LAD 24m, LCX nl, RCA 25p, EF 55-65%.   PONV (postoperative nausea and vomiting)    S/P colonoscopy Jan 2011   Dr. Benson Norway: sessile polyp (benign lymphoid), large hemorrhoids, repeat 5-10 years   Temporal arteritis ([place])    Type 2 diabetes mellitus ([employer])    Wears glasses     Patient Active Problem List   Diagnosis Date Noted   LUQ pain 05/11/2021   Fatigue 05/03/2021   C. difficile colitis 02/15/2021   Dizziness    Breast mass, right    History of right breast cancer    Lobar pneumonia, unspecified organism  ([place]) 07/03/2020   Pleural effusion, right    S/P left mastectomy 06/15/2020   Allergy, unspecified, initial encounter 03/03/2020   Anaphylactic shock, unspecified, initial encounter 03/03/2020   Breast cancer ([place]) 02/18/2020   History of modified radical mastectomy of right breast 02/18/2020   S/P mastectomy, right 02/18/2020   ESRD (end stage renal disease) on dialysis ([place])    Grade III hemorrhoids 07/31/2019   Fluid overload, unspecified 05/24/2019   Anaphylactic reaction due to adverse effect of correct drug or medicament properly administered, initial encounter 04/30/2019   Unspecified protein-calorie malnutrition ([place]) 03/03/2019   Hypertensive heart disease with heart failure ([place]) 03/01/2019   Atherosclerotic heart disease of native coronary artery without angina pectoris 03/01/2019   Chest pain 02/28/2019   Chest pain with high risk for cardiac etiology 02/28/2019   Elevated troponin 02/03/2019   ESRD (end stage renal disease) ([place]) 73/42/8768   Acute diastolic CHF (congestive heart failure) ([place]) 02/03/2019   Hypokalemia 02/03/2019   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[place])    Hypocalcemia 09/03/2018   Hypothyroidism, unspecified 09/21/2017   Anemia in chronic kidney disease 04/12/2017   Iron deficiency anemia, unspecified 04/12/2017   Abnormal results of kidney function studies 04/11/2017  Acidosis 04/11/2017   Anemia, unspecified 04/11/2017   Coagulation defect, unspecified ([place]) 04/11/2017   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, with stage 5 chronic kidney disease, or end stage renal disease ([place]) 04/11/2017   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 04/11/2017   Other disorders of phosphorus metabolism 04/11/2017   Pain, unspecified 04/11/2017   Proteinuria, unspecified 04/11/2017   Pruritus, unspecified 04/11/2017   Unspecified kidney failure 04/11/2017   Encounter for  prophylactic removal of breast 04/04/2017   Rectal bleeding 06/08/2016   Vaginal discharge 12/30/2015   Abdominal pain, epigastric 11/26/2013   GERD (gastroesophageal reflux disease)    Type 2 diabetes mellitus ([place])    CKD (chronic kidney disease) stage 3, GFR 30-59 ml/min (HCC)    External hemorrhoid 08/01/2011   Hematochezia 07/13/2011   Dyspnea 04/21/2010   Atypical chest pain 04/21/2010   Mixed hyperlipidemia 03/11/2008   OBESITY 03/11/2008   Essential hypertension, benign 03/11/2008    Past Surgical History:  Procedure Laterality Date   ABDOMINAL HYSTERECTOMY     APPENDECTOMY     ARTERY BIOPSY N/A 05/09/2018   Procedure: RIGHT TEMPORAL ARTERY BIOPSY;  Surgeon: Judeth Horn, MD;  Location: [place];  Service: General;  Laterality: N/A;   BREAST BIOPSY Right 06/15/2020   Procedure: RIGHT BREAST BIOPSY;  Surgeon: Aviva Signs, MD;  Location: [facility];  Service: General;  Laterality: Right;   CATARACT EXTRACTION W/PHACO Left 02/09/2017   Procedure: CATARACT EXTRACTION PHACO AND INTRAOCULAR LENS PLACEMENT LEFT EYE;  Surgeon: Tonny Branch, MD;  Location: [facility];  Service: Ophthalmology;  Laterality: Left;  CDE: 4.89   CATARACT EXTRACTION W/PHACO Right 06/04/2017   Procedure: CATARACT EXTRACTION PHACO AND INTRAOCULAR LENS PLACEMENT (IOC);  Surgeon: Tonny Branch, MD;  Location: [facility];  Service: Ophthalmology;  Laterality: Right;  CDE: 4.12   CHOLECYSTECTOMY  09/29/2011   Procedure: LAPAROSCOPIC CHOLECYSTECTOMY;  Surgeon: Jamesetta So, MD;  Location: [facility];  Service: General;  Laterality: N/A;   COLONOSCOPY  08/2009   Dr. Benson Norway: sessile polyp (benign lymphoid), large hemorrhoids, repeat 5-10 years   COLONOSCOPY N/A 06/12/2016   prominent hemorrhoids   COLONOSCOPY WITH PROPOFOL N/A 06/16/2021   Procedure: COLONOSCOPY WITH PROPOFOL;  Surgeon: Daneil Dolin, MD;  [facility];  Service: Endoscopy;  Laterality: N/A;  9:30am (dialysis pt)   ESOPHAGOGASTRODUODENOSCOPY  09/05/2011    XBM:WUXLK hiatal hernia; remainder of exam normal. No explanation for patient's abdominal pain with today's examination   ESOPHAGOGASTRODUODENOSCOPY N/A 12/17/2013   Dr. [name]: gastric erythema, erosion, mild chronic inflammation on path    ESOPHAGOGASTRODUODENOSCOPY (EGD) WITH PROPOFOL N/A 06/16/2021   Procedure: ESOPHAGOGASTRODUODENOSCOPY (EGD) WITH PROPOFOL;  Surgeon: Daneil Dolin, MD;  [facility];  Service: Endoscopy;  Laterality: N/A;   EXCISION OF BREAST BIOPSY Right 10/12/2020   Procedure: EXCISION OF RIGHT BREAST BIOPSY;  Surgeon: Aviva Signs, MD;  Location: [facility];  Service: General;  Laterality: Right;   LAPAROSCOPIC APPENDECTOMY  09/29/2011   Procedure: APPENDECTOMY LAPAROSCOPIC;  Surgeon: Jamesetta So, MD;  Location: [facility];  Service: General;;  incidental appendectomy   LEFT HEART CATH AND CORONARY ANGIOGRAPHY N/A 02/28/2019   Procedure: LEFT HEART CATH AND CORONARY ANGIOGRAPHY;  Surgeon: Jettie Booze, MD;  Location: [place] [facility];  Service: Cardiovascular;  Laterality: N/A;   MASTECTOMY MODIFIED RADICAL Right 02/18/2020   Procedure: MASTECTOMY MODIFIED RADICAL;  Surgeon: Aviva Signs, MD;  Location: [facility];  Service: General;  Laterality: Right;  MASTECTOMY, PARTIAL Right 07/13/2020   Procedure: RIGHT PARTIAL MASTECTOMY;  Surgeon: Aviva Signs, MD;  Location: [facility];  Service: General;  Laterality: Right;   PARTIAL MASTECTOMY WITH NEEDLE LOCALIZATION AND AXILLARY SENTINEL LYMPH NODE BX Right 09/18/2018   Procedure: RIGHT PARTIAL MASTECTOMY AFTER NEEDLE LOCALIZATION, SENTINEL LYMPH NODE BIOPSY RIGHT AXILLA;  Surgeon: Aviva Signs, MD;  Location: [facility];  Service: General;  Laterality: Right;   POLYPECTOMY  06/16/2021   Procedure: POLYPECTOMY;  Surgeon: Daneil Dolin, MD;  [facility];  Service: Endoscopy;;   SIMPLE MASTECTOMY WITH AXILLARY SENTINEL NODE BIOPSY Left 06/15/2020   Procedure: LEFT SIMPLE MASTECTOMY;  Surgeon: Aviva Signs, MD;  Location: [facility];  Service: General;  Laterality: Left;     OB History     Gravida      Para      Term      Preterm      AB      Living  1      SAB      IAB      Ectopic      Multiple      Live Births              Family History  Problem Relation Age of Onset   Hypertension Mother    Coronary artery disease Mother    Diabetes Mother    Hypertension Sister    Coronary artery disease Sister    Hypertension Brother    Heart attack Father    Hypertension Son    Heart attack Maternal Aunt    Hypertension Maternal Aunt    Diabetes Maternal Aunt    Heart attack Maternal Uncle    Hypertension Maternal Uncle    Diabetes Maternal Uncle    Heart attack Paternal Aunt    Hypertension Paternal Aunt    Diabetes Paternal Aunt    Heart attack Paternal Uncle    Hypertension Paternal Uncle    Diabetes Paternal Uncle    Heart attack Maternal Grandmother    Heart attack Maternal Grandfather    Heart attack Paternal Grandmother    Heart attack Paternal Grandfather    Colon cancer Neg Hx     Social History   Tobacco Use   Smoking status: Never   Smokeless tobacco: Never  Vaping Use   Vaping Use: Never used  Substance Use Topics   Alcohol use: No   Drug use: No    Home Medications Prior to Admission medications   Medication Sig Start Date End Date Taking? Authorizing Provider  acetaminophen (TYLENOL) 500 MG tablet Take 500 mg by mouth every 8 (eight) hours as needed. 11/10/20 11/09/21 Yes [provider]  amLODipine (NORVASC) 10 MG tablet TAKE 1 TABLET BY MOUTH AT BEDTIME 11/25/20  Yes [name], MD  anastrozole (ARIMIDEX) 1 MG tablet TAKE 1 TABLET(1 MG) BY MOUTH DAILY 05/23/21  Yes Derek Jack, MD  aspirin EC 81 MG tablet Take 81 mg by mouth daily.   Yes [provider]  atorvastatin (LIPITOR) 20 MG tablet TAKE 1 TABLET DAILY (NEEDS OFFICE VISIT) 06/13/21  Yes [name], MD  B Complex-C-Folic Acid  (DIALYVITE 800) 0.8 MG TABS Take 1 tablet by mouth daily. 01/22/18  Yes [provider]  cinacalcet (SENSIPAR) 30 MG tablet Take 2 tablets (60 mg total) by mouth daily. 06/13/18  Yes [name], MD  Darbepoetin Alfa (ARANESP, ALBUMIN FREE, IJ) Take once a week with dialysis unsure of dose 07/01/21  06/30/22 Yes [provider]  DULoxetine (CYMBALTA) 30 MG capsule TAKE 1 CAPSULE(30 MG) BY MOUTH DAILY 02/07/21  Yes [name], MD  gabapentin (NEURONTIN) 300 MG capsule TAKE 1 CAPSULE(300 MG) BY MOUTH AT BEDTIME 07/04/21  Yes [name], MD  HYDROcodone-acetaminophen (NORCO/VICODIN) 5-325 MG tablet Take 1 tablet by mouth every 6 (six) hours as needed. 06/02/21  Yes Katherine Syme, Almyra Free, PA-C  lanthanum (FOSRENOL) 1000 MG chewable tablet Chew 2,000-3,000 mg by mouth See admin instructions. Take 3 tablets (3000 mg) by mouth with meals and take 2 tablets (2000 mg) with snacks 10/16/19  Yes [provider]  lidocaine-prilocaine (EMLA) cream Apply 1 application topically every Monday, Wednesday, and Friday with hemodialysis. 09/24/18  Yes [provider]  nebivolol (BYSTOLIC) 10 MG tablet TAKE 1 TABLET IN THE MORNING AND AT BEDTIME 04/21/21  Yes [name], MD  pantoprazole (PROTONIX) 40 MG tablet TAKE 1 TABLET(40 MG) BY MOUTH DAILY 11/25/20  Yes [name], MD    Allergies    Patient has no known allergies.  Review of Systems   Review of Systems  Constitutional:  Negative for chills and fever.  HENT: Negative.    Eyes: Negative.   Respiratory:  Negative for chest tightness and shortness of breath.   Cardiovascular: Negative.   Gastrointestinal: Negative.   Genitourinary: Negative.   Musculoskeletal:  Negative for arthralgias and joint swelling.  Skin: Negative.  Negative for color change, rash and wound.  Neurological:  Negative for weakness.  Psychiatric/Behavioral: Negative.    All other systems reviewed and are negative.  Physical  Exam Updated Vital Signs BP 130/83   Pulse 79   Temp 98.1 F (36.7 C) (Oral)   Resp 17   Ht 5\' 2"  (1.575 m)   Wt 80.3 kg   SpO2 94%   BMI 32.37 kg/m   Physical Exam Vitals and nursing note reviewed.  Constitutional:      Appearance: She is well-developed.  HENT:     Head: Normocephalic and atraumatic.  Eyes:     Conjunctiva/sclera: Conjunctivae normal.  Cardiovascular:     Rate and Rhythm: Normal rate.  Pulmonary:     Effort: Pulmonary effort is normal.  Musculoskeletal:        General: Normal range of motion.     Cervical back: Normal range of motion.  Skin:    General: Skin is warm and dry.     Findings: No erythema.     Comments: Localized nontender soft edema noted left volar forearm at the fistula site.  Normal hum.  No skin erythema, no edema distal or proximal to the fistula site.  Neurological:     Mental Status: She is alert.    ED Results / Procedures / Treatments   Labs (all labs ordered are listed, but only abnormal results are displayed) Labs Reviewed - No data to display  EKG None  Radiology [place] [facility]  Result Date: 07/10/2021 CLINICAL DATA:  Upper extremity swelling involving left forearm fistula since this past Thursday. EXAM: ULTRASOUND DIALYSIS ACCESS TECHNIQUE: Sonographic evaluation, including color Doppler imaging was performed of the patient's left forearm fistula COMPARISON:  None. FINDINGS: Sonographic evaluation of the patient's left forearm radial-cephalic fistula demonstrates wide patency of the imaged portions of the radial artery, the anastomosis as well as the imaged portions of the draining cephalic vein. There is no significant mural thrombus within the fistula. IMPRESSION: 1. No explanation for patient's left forearm swelling. 2. Wide patency of the imaged  portions of the patient's left forearm radial-cephalic AV fistula. Electronically Signed   By: Sandi Mariscal M.D.   On: 07/10/2021 14:23    Procedures Procedures    Medications Ordered in ED Medications - No data to display  ED Course  I have reviewed the triage vital signs and the nursing notes.  Pertinent labs & imaging results that were available during my care of the patient were reviewed by me and considered in my medical decision making (see chart for details).    MDM Rules/Calculators/A&P                          Ultrasound reviewed and discussed with patient.  Reassuring with this normal exam, there does not appear to be any fistula complications.  She was advised to follow-up with her dialysis providers for further evaluation if her symptoms persist.  Final Clinical Impression(s) / ED Diagnoses Final diagnoses:  Localized swelling of forearm    Rx / DC Orders ED Discharge Orders     None        Landis Martins 07/10/21 1613    Davonna Belling, MD 07/12/21 0930

## 2021-07-11 DIAGNOSIS — Z992 Dependence on renal dialysis: Secondary | ICD-10-CM | POA: Diagnosis not present

## 2021-07-11 DIAGNOSIS — D631 Anemia in chronic kidney disease: Secondary | ICD-10-CM | POA: Diagnosis not present

## 2021-07-11 DIAGNOSIS — N2581 Secondary hyperparathyroidism of renal origin: Secondary | ICD-10-CM | POA: Diagnosis not present

## 2021-07-11 DIAGNOSIS — N186 End stage renal disease: Secondary | ICD-10-CM | POA: Diagnosis not present

## 2021-07-11 DIAGNOSIS — E1129 Type 2 diabetes mellitus with other diabetic kidney complication: Secondary | ICD-10-CM | POA: Diagnosis not present

## 2021-07-11 DIAGNOSIS — E119 Type 2 diabetes mellitus without complications: Secondary | ICD-10-CM | POA: Diagnosis not present

## 2021-07-12 ENCOUNTER — Other Ambulatory Visit: Payer: Self-pay

## 2021-07-12 ENCOUNTER — Ambulatory Visit (HOSPITAL_COMMUNITY)
Admission: RE | Admit: 2021-07-12 | Discharge: 2021-07-12 | Disposition: A | Discharge status: Home / Self Care | Payer: Medicare Other | Source: Ambulatory Visit | Attending: Pediatrics | Admitting: Pediatrics

## 2021-07-12 DIAGNOSIS — N2889 Other specified disorders of kidney and ureter: Secondary | ICD-10-CM | POA: Insufficient documentation

## 2021-07-12 DIAGNOSIS — N281 Cyst of kidney, acquired: Secondary | ICD-10-CM | POA: Diagnosis not present

## 2021-07-12 DIAGNOSIS — Z9049 Acquired absence of other specified parts of digestive tract: Secondary | ICD-10-CM | POA: Diagnosis not present

## 2021-07-12 DIAGNOSIS — N261 Atrophy of kidney (terminal): Secondary | ICD-10-CM | POA: Diagnosis not present

## 2021-07-13 DIAGNOSIS — E119 Type 2 diabetes mellitus without complications: Secondary | ICD-10-CM | POA: Diagnosis not present

## 2021-07-13 DIAGNOSIS — N186 End stage renal disease: Secondary | ICD-10-CM | POA: Diagnosis not present

## 2021-07-13 DIAGNOSIS — N2581 Secondary hyperparathyroidism of renal origin: Secondary | ICD-10-CM | POA: Diagnosis not present

## 2021-07-13 DIAGNOSIS — E1129 Type 2 diabetes mellitus with other diabetic kidney complication: Secondary | ICD-10-CM | POA: Diagnosis not present

## 2021-07-13 DIAGNOSIS — D631 Anemia in chronic kidney disease: Secondary | ICD-10-CM | POA: Diagnosis not present

## 2021-07-13 DIAGNOSIS — Z992 Dependence on renal dialysis: Secondary | ICD-10-CM | POA: Diagnosis not present

## 2021-07-14 DIAGNOSIS — N186 End stage renal disease: Secondary | ICD-10-CM | POA: Diagnosis not present

## 2021-07-14 DIAGNOSIS — Z992 Dependence on renal dialysis: Secondary | ICD-10-CM | POA: Diagnosis not present

## 2021-07-14 DIAGNOSIS — N040 Nephrotic syndrome with minor glomerular abnormality: Secondary | ICD-10-CM | POA: Diagnosis not present

## 2021-07-15 DIAGNOSIS — N186 End stage renal disease: Secondary | ICD-10-CM | POA: Diagnosis not present

## 2021-07-15 DIAGNOSIS — D631 Anemia in chronic kidney disease: Secondary | ICD-10-CM | POA: Diagnosis not present

## 2021-07-15 DIAGNOSIS — E1129 Type 2 diabetes mellitus with other diabetic kidney complication: Secondary | ICD-10-CM | POA: Diagnosis not present

## 2021-07-15 DIAGNOSIS — E8779 Other fluid overload: Secondary | ICD-10-CM | POA: Diagnosis not present

## 2021-07-15 DIAGNOSIS — N2581 Secondary hyperparathyroidism of renal origin: Secondary | ICD-10-CM | POA: Diagnosis not present

## 2021-07-15 DIAGNOSIS — Z992 Dependence on renal dialysis: Secondary | ICD-10-CM | POA: Diagnosis not present

## 2021-07-15 DIAGNOSIS — E119 Type 2 diabetes mellitus without complications: Secondary | ICD-10-CM | POA: Diagnosis not present

## 2021-07-18 DIAGNOSIS — E119 Type 2 diabetes mellitus without complications: Secondary | ICD-10-CM | POA: Diagnosis not present

## 2021-07-18 DIAGNOSIS — N186 End stage renal disease: Secondary | ICD-10-CM | POA: Diagnosis not present

## 2021-07-18 DIAGNOSIS — Z992 Dependence on renal dialysis: Secondary | ICD-10-CM | POA: Diagnosis not present

## 2021-07-18 DIAGNOSIS — D631 Anemia in chronic kidney disease: Secondary | ICD-10-CM | POA: Diagnosis not present

## 2021-07-18 DIAGNOSIS — E1129 Type 2 diabetes mellitus with other diabetic kidney complication: Secondary | ICD-10-CM | POA: Diagnosis not present

## 2021-07-18 DIAGNOSIS — N2581 Secondary hyperparathyroidism of renal origin: Secondary | ICD-10-CM | POA: Diagnosis not present

## 2021-07-19 DIAGNOSIS — Z992 Dependence on renal dialysis: Secondary | ICD-10-CM | POA: Diagnosis not present

## 2021-07-19 DIAGNOSIS — D631 Anemia in chronic kidney disease: Secondary | ICD-10-CM | POA: Diagnosis not present

## 2021-07-19 DIAGNOSIS — N186 End stage renal disease: Secondary | ICD-10-CM | POA: Diagnosis not present

## 2021-07-19 DIAGNOSIS — N2581 Secondary hyperparathyroidism of renal origin: Secondary | ICD-10-CM | POA: Diagnosis not present

## 2021-07-19 DIAGNOSIS — E1129 Type 2 diabetes mellitus with other diabetic kidney complication: Secondary | ICD-10-CM | POA: Diagnosis not present

## 2021-07-19 DIAGNOSIS — E119 Type 2 diabetes mellitus without complications: Secondary | ICD-10-CM | POA: Diagnosis not present

## 2021-07-20 DIAGNOSIS — E119 Type 2 diabetes mellitus without complications: Secondary | ICD-10-CM | POA: Diagnosis not present

## 2021-07-20 DIAGNOSIS — N2581 Secondary hyperparathyroidism of renal origin: Secondary | ICD-10-CM | POA: Diagnosis not present

## 2021-07-20 DIAGNOSIS — Z992 Dependence on renal dialysis: Secondary | ICD-10-CM | POA: Diagnosis not present

## 2021-07-20 DIAGNOSIS — D631 Anemia in chronic kidney disease: Secondary | ICD-10-CM | POA: Diagnosis not present

## 2021-07-20 DIAGNOSIS — E1129 Type 2 diabetes mellitus with other diabetic kidney complication: Secondary | ICD-10-CM | POA: Diagnosis not present

## 2021-07-20 DIAGNOSIS — N186 End stage renal disease: Secondary | ICD-10-CM | POA: Diagnosis not present

## 2021-07-21 DIAGNOSIS — Z992 Dependence on renal dialysis: Secondary | ICD-10-CM | POA: Diagnosis not present

## 2021-07-21 DIAGNOSIS — R2232 Localized swelling, mass and lump, left upper limb: Secondary | ICD-10-CM | POA: Diagnosis not present

## 2021-07-21 DIAGNOSIS — N186 End stage renal disease: Secondary | ICD-10-CM | POA: Diagnosis not present

## 2021-07-22 DIAGNOSIS — Z992 Dependence on renal dialysis: Secondary | ICD-10-CM | POA: Diagnosis not present

## 2021-07-22 DIAGNOSIS — N2581 Secondary hyperparathyroidism of renal origin: Secondary | ICD-10-CM | POA: Diagnosis not present

## 2021-07-22 DIAGNOSIS — E1129 Type 2 diabetes mellitus with other diabetic kidney complication: Secondary | ICD-10-CM | POA: Diagnosis not present

## 2021-07-22 DIAGNOSIS — E119 Type 2 diabetes mellitus without complications: Secondary | ICD-10-CM | POA: Diagnosis not present

## 2021-07-22 DIAGNOSIS — D631 Anemia in chronic kidney disease: Secondary | ICD-10-CM | POA: Diagnosis not present

## 2021-07-22 DIAGNOSIS — N186 End stage renal disease: Secondary | ICD-10-CM | POA: Diagnosis not present

## 2021-07-25 DIAGNOSIS — E119 Type 2 diabetes mellitus without complications: Secondary | ICD-10-CM | POA: Diagnosis not present

## 2021-07-25 DIAGNOSIS — Z992 Dependence on renal dialysis: Secondary | ICD-10-CM | POA: Diagnosis not present

## 2021-07-25 DIAGNOSIS — E1129 Type 2 diabetes mellitus with other diabetic kidney complication: Secondary | ICD-10-CM | POA: Diagnosis not present

## 2021-07-25 DIAGNOSIS — D631 Anemia in chronic kidney disease: Secondary | ICD-10-CM | POA: Diagnosis not present

## 2021-07-25 DIAGNOSIS — N186 End stage renal disease: Secondary | ICD-10-CM | POA: Diagnosis not present

## 2021-07-25 DIAGNOSIS — N2581 Secondary hyperparathyroidism of renal origin: Secondary | ICD-10-CM | POA: Diagnosis not present

## 2021-07-27 DIAGNOSIS — Z992 Dependence on renal dialysis: Secondary | ICD-10-CM | POA: Diagnosis not present

## 2021-07-27 DIAGNOSIS — D631 Anemia in chronic kidney disease: Secondary | ICD-10-CM | POA: Diagnosis not present

## 2021-07-27 DIAGNOSIS — E119 Type 2 diabetes mellitus without complications: Secondary | ICD-10-CM | POA: Diagnosis not present

## 2021-07-27 DIAGNOSIS — E1129 Type 2 diabetes mellitus with other diabetic kidney complication: Secondary | ICD-10-CM | POA: Diagnosis not present

## 2021-07-27 DIAGNOSIS — N2581 Secondary hyperparathyroidism of renal origin: Secondary | ICD-10-CM | POA: Diagnosis not present

## 2021-07-27 DIAGNOSIS — N186 End stage renal disease: Secondary | ICD-10-CM | POA: Diagnosis not present

## 2021-07-29 DIAGNOSIS — D631 Anemia in chronic kidney disease: Secondary | ICD-10-CM | POA: Diagnosis not present

## 2021-07-29 DIAGNOSIS — N186 End stage renal disease: Secondary | ICD-10-CM | POA: Diagnosis not present

## 2021-07-29 DIAGNOSIS — E1129 Type 2 diabetes mellitus with other diabetic kidney complication: Secondary | ICD-10-CM | POA: Diagnosis not present

## 2021-07-29 DIAGNOSIS — Z992 Dependence on renal dialysis: Secondary | ICD-10-CM | POA: Diagnosis not present

## 2021-07-29 DIAGNOSIS — N2581 Secondary hyperparathyroidism of renal origin: Secondary | ICD-10-CM | POA: Diagnosis not present

## 2021-07-29 DIAGNOSIS — E119 Type 2 diabetes mellitus without complications: Secondary | ICD-10-CM | POA: Diagnosis not present

## 2021-08-01 DIAGNOSIS — N2581 Secondary hyperparathyroidism of renal origin: Secondary | ICD-10-CM | POA: Diagnosis not present

## 2021-08-01 DIAGNOSIS — E119 Type 2 diabetes mellitus without complications: Secondary | ICD-10-CM | POA: Diagnosis not present

## 2021-08-01 DIAGNOSIS — D631 Anemia in chronic kidney disease: Secondary | ICD-10-CM | POA: Diagnosis not present

## 2021-08-01 DIAGNOSIS — N186 End stage renal disease: Secondary | ICD-10-CM | POA: Diagnosis not present

## 2021-08-01 DIAGNOSIS — Z992 Dependence on renal dialysis: Secondary | ICD-10-CM | POA: Diagnosis not present

## 2021-08-01 DIAGNOSIS — E1129 Type 2 diabetes mellitus with other diabetic kidney complication: Secondary | ICD-10-CM | POA: Diagnosis not present

## 2021-08-02 DIAGNOSIS — Z992 Dependence on renal dialysis: Secondary | ICD-10-CM | POA: Diagnosis not present

## 2021-08-02 DIAGNOSIS — N2581 Secondary hyperparathyroidism of renal origin: Secondary | ICD-10-CM | POA: Diagnosis not present

## 2021-08-02 DIAGNOSIS — D631 Anemia in chronic kidney disease: Secondary | ICD-10-CM | POA: Diagnosis not present

## 2021-08-02 DIAGNOSIS — E1129 Type 2 diabetes mellitus with other diabetic kidney complication: Secondary | ICD-10-CM | POA: Diagnosis not present

## 2021-08-02 DIAGNOSIS — N186 End stage renal disease: Secondary | ICD-10-CM | POA: Diagnosis not present

## 2021-08-02 DIAGNOSIS — E119 Type 2 diabetes mellitus without complications: Secondary | ICD-10-CM | POA: Diagnosis not present

## 2021-08-03 DIAGNOSIS — N2581 Secondary hyperparathyroidism of renal origin: Secondary | ICD-10-CM | POA: Diagnosis not present

## 2021-08-03 DIAGNOSIS — N186 End stage renal disease: Secondary | ICD-10-CM | POA: Diagnosis not present

## 2021-08-03 DIAGNOSIS — D631 Anemia in chronic kidney disease: Secondary | ICD-10-CM | POA: Diagnosis not present

## 2021-08-03 DIAGNOSIS — E119 Type 2 diabetes mellitus without complications: Secondary | ICD-10-CM | POA: Diagnosis not present

## 2021-08-03 DIAGNOSIS — Z992 Dependence on renal dialysis: Secondary | ICD-10-CM | POA: Diagnosis not present

## 2021-08-03 DIAGNOSIS — E1129 Type 2 diabetes mellitus with other diabetic kidney complication: Secondary | ICD-10-CM | POA: Diagnosis not present

## 2021-08-05 DIAGNOSIS — N2581 Secondary hyperparathyroidism of renal origin: Secondary | ICD-10-CM | POA: Diagnosis not present

## 2021-08-05 DIAGNOSIS — N186 End stage renal disease: Secondary | ICD-10-CM | POA: Diagnosis not present

## 2021-08-05 DIAGNOSIS — E1129 Type 2 diabetes mellitus with other diabetic kidney complication: Secondary | ICD-10-CM | POA: Diagnosis not present

## 2021-08-05 DIAGNOSIS — D631 Anemia in chronic kidney disease: Secondary | ICD-10-CM | POA: Diagnosis not present

## 2021-08-05 DIAGNOSIS — E119 Type 2 diabetes mellitus without complications: Secondary | ICD-10-CM | POA: Diagnosis not present

## 2021-08-05 DIAGNOSIS — Z992 Dependence on renal dialysis: Secondary | ICD-10-CM | POA: Diagnosis not present

## 2021-08-08 DIAGNOSIS — E1129 Type 2 diabetes mellitus with other diabetic kidney complication: Secondary | ICD-10-CM | POA: Diagnosis not present

## 2021-08-08 DIAGNOSIS — E119 Type 2 diabetes mellitus without complications: Secondary | ICD-10-CM | POA: Diagnosis not present

## 2021-08-08 DIAGNOSIS — D631 Anemia in chronic kidney disease: Secondary | ICD-10-CM | POA: Diagnosis not present

## 2021-08-08 DIAGNOSIS — N186 End stage renal disease: Secondary | ICD-10-CM | POA: Diagnosis not present

## 2021-08-08 DIAGNOSIS — N2581 Secondary hyperparathyroidism of renal origin: Secondary | ICD-10-CM | POA: Diagnosis not present

## 2021-08-08 DIAGNOSIS — Z992 Dependence on renal dialysis: Secondary | ICD-10-CM | POA: Diagnosis not present

## 2021-08-10 DIAGNOSIS — N2581 Secondary hyperparathyroidism of renal origin: Secondary | ICD-10-CM | POA: Diagnosis not present

## 2021-08-10 DIAGNOSIS — E119 Type 2 diabetes mellitus without complications: Secondary | ICD-10-CM | POA: Diagnosis not present

## 2021-08-10 DIAGNOSIS — Z992 Dependence on renal dialysis: Secondary | ICD-10-CM | POA: Diagnosis not present

## 2021-08-10 DIAGNOSIS — N186 End stage renal disease: Secondary | ICD-10-CM | POA: Diagnosis not present

## 2021-08-10 DIAGNOSIS — D631 Anemia in chronic kidney disease: Secondary | ICD-10-CM | POA: Diagnosis not present

## 2021-08-10 DIAGNOSIS — E1129 Type 2 diabetes mellitus with other diabetic kidney complication: Secondary | ICD-10-CM | POA: Diagnosis not present

## 2021-08-12 ENCOUNTER — Encounter (HOSPITAL_COMMUNITY): Payer: Self-pay | Admitting: Hematology

## 2021-08-12 DIAGNOSIS — E1129 Type 2 diabetes mellitus with other diabetic kidney complication: Secondary | ICD-10-CM | POA: Diagnosis not present

## 2021-08-12 DIAGNOSIS — N2581 Secondary hyperparathyroidism of renal origin: Secondary | ICD-10-CM | POA: Diagnosis not present

## 2021-08-12 DIAGNOSIS — D631 Anemia in chronic kidney disease: Secondary | ICD-10-CM | POA: Diagnosis not present

## 2021-08-12 DIAGNOSIS — E119 Type 2 diabetes mellitus without complications: Secondary | ICD-10-CM | POA: Diagnosis not present

## 2021-08-12 DIAGNOSIS — N186 End stage renal disease: Secondary | ICD-10-CM | POA: Diagnosis not present

## 2021-08-12 DIAGNOSIS — Z992 Dependence on renal dialysis: Secondary | ICD-10-CM | POA: Diagnosis not present

## 2021-08-14 DIAGNOSIS — N186 End stage renal disease: Secondary | ICD-10-CM | POA: Diagnosis not present

## 2021-08-14 DIAGNOSIS — N040 Nephrotic syndrome with minor glomerular abnormality: Secondary | ICD-10-CM | POA: Diagnosis not present

## 2021-08-14 DIAGNOSIS — Z992 Dependence on renal dialysis: Secondary | ICD-10-CM | POA: Diagnosis not present

## 2021-08-15 DIAGNOSIS — E1129 Type 2 diabetes mellitus with other diabetic kidney complication: Secondary | ICD-10-CM | POA: Diagnosis not present

## 2021-08-15 DIAGNOSIS — E119 Type 2 diabetes mellitus without complications: Secondary | ICD-10-CM | POA: Diagnosis not present

## 2021-08-15 DIAGNOSIS — D631 Anemia in chronic kidney disease: Secondary | ICD-10-CM | POA: Diagnosis not present

## 2021-08-15 DIAGNOSIS — D509 Iron deficiency anemia, unspecified: Secondary | ICD-10-CM | POA: Diagnosis not present

## 2021-08-15 DIAGNOSIS — N2581 Secondary hyperparathyroidism of renal origin: Secondary | ICD-10-CM | POA: Diagnosis not present

## 2021-08-15 DIAGNOSIS — N186 End stage renal disease: Secondary | ICD-10-CM | POA: Diagnosis not present

## 2021-08-15 DIAGNOSIS — Z992 Dependence on renal dialysis: Secondary | ICD-10-CM | POA: Diagnosis not present

## 2021-08-17 DIAGNOSIS — D509 Iron deficiency anemia, unspecified: Secondary | ICD-10-CM | POA: Diagnosis not present

## 2021-08-17 DIAGNOSIS — E1129 Type 2 diabetes mellitus with other diabetic kidney complication: Secondary | ICD-10-CM | POA: Diagnosis not present

## 2021-08-17 DIAGNOSIS — N186 End stage renal disease: Secondary | ICD-10-CM | POA: Diagnosis not present

## 2021-08-17 DIAGNOSIS — Z992 Dependence on renal dialysis: Secondary | ICD-10-CM | POA: Diagnosis not present

## 2021-08-17 DIAGNOSIS — D631 Anemia in chronic kidney disease: Secondary | ICD-10-CM | POA: Diagnosis not present

## 2021-08-17 DIAGNOSIS — N2581 Secondary hyperparathyroidism of renal origin: Secondary | ICD-10-CM | POA: Diagnosis not present

## 2021-08-19 ENCOUNTER — Other Ambulatory Visit (HOSPITAL_COMMUNITY): Payer: Self-pay | Admitting: Hematology

## 2021-08-19 ENCOUNTER — Other Ambulatory Visit: Payer: Self-pay | Admitting: Family Medicine

## 2021-08-19 DIAGNOSIS — D509 Iron deficiency anemia, unspecified: Secondary | ICD-10-CM | POA: Diagnosis not present

## 2021-08-19 DIAGNOSIS — D631 Anemia in chronic kidney disease: Secondary | ICD-10-CM | POA: Diagnosis not present

## 2021-08-19 DIAGNOSIS — N186 End stage renal disease: Secondary | ICD-10-CM | POA: Diagnosis not present

## 2021-08-19 DIAGNOSIS — Z170 Estrogen receptor positive status [ER+]: Secondary | ICD-10-CM

## 2021-08-19 DIAGNOSIS — N2581 Secondary hyperparathyroidism of renal origin: Secondary | ICD-10-CM | POA: Diagnosis not present

## 2021-08-19 DIAGNOSIS — Z992 Dependence on renal dialysis: Secondary | ICD-10-CM | POA: Diagnosis not present

## 2021-08-19 DIAGNOSIS — U071 COVID-19: Secondary | ICD-10-CM | POA: Diagnosis not present

## 2021-08-19 DIAGNOSIS — E1129 Type 2 diabetes mellitus with other diabetic kidney complication: Secondary | ICD-10-CM | POA: Diagnosis not present

## 2021-08-19 DIAGNOSIS — C50211 Malignant neoplasm of upper-inner quadrant of right female breast: Secondary | ICD-10-CM

## 2021-08-22 DIAGNOSIS — E1129 Type 2 diabetes mellitus with other diabetic kidney complication: Secondary | ICD-10-CM | POA: Diagnosis not present

## 2021-08-22 DIAGNOSIS — N2581 Secondary hyperparathyroidism of renal origin: Secondary | ICD-10-CM | POA: Diagnosis not present

## 2021-08-22 DIAGNOSIS — D631 Anemia in chronic kidney disease: Secondary | ICD-10-CM | POA: Diagnosis not present

## 2021-08-22 DIAGNOSIS — Z992 Dependence on renal dialysis: Secondary | ICD-10-CM | POA: Diagnosis not present

## 2021-08-22 DIAGNOSIS — D509 Iron deficiency anemia, unspecified: Secondary | ICD-10-CM | POA: Diagnosis not present

## 2021-08-22 DIAGNOSIS — N186 End stage renal disease: Secondary | ICD-10-CM | POA: Diagnosis not present

## 2021-08-23 ENCOUNTER — Inpatient Hospital Stay (HOSPITAL_COMMUNITY): Payer: Medicare Other | Attending: Hematology

## 2021-08-23 ENCOUNTER — Other Ambulatory Visit: Payer: Self-pay

## 2021-08-23 ENCOUNTER — Encounter: Payer: Self-pay | Admitting: Internal Medicine

## 2021-08-23 DIAGNOSIS — M8580 Other specified disorders of bone density and structure, unspecified site: Secondary | ICD-10-CM | POA: Diagnosis not present

## 2021-08-23 DIAGNOSIS — E559 Vitamin D deficiency, unspecified: Secondary | ICD-10-CM

## 2021-08-23 DIAGNOSIS — Z170 Estrogen receptor positive status [ER+]: Secondary | ICD-10-CM | POA: Insufficient documentation

## 2021-08-23 DIAGNOSIS — N186 End stage renal disease: Secondary | ICD-10-CM | POA: Insufficient documentation

## 2021-08-23 DIAGNOSIS — Z992 Dependence on renal dialysis: Secondary | ICD-10-CM | POA: Diagnosis not present

## 2021-08-23 DIAGNOSIS — C50211 Malignant neoplasm of upper-inner quadrant of right female breast: Secondary | ICD-10-CM | POA: Insufficient documentation

## 2021-08-23 DIAGNOSIS — G629 Polyneuropathy, unspecified: Secondary | ICD-10-CM | POA: Insufficient documentation

## 2021-08-23 LAB — CBC WITH DIFFERENTIAL/PLATELET
Abs Immature Granulocytes: 0.02 10*3/uL (ref 0.00–0.07)
Basophils Absolute: 0.1 10*3/uL (ref 0.0–0.1)
Basophils Relative: 1 %
Eosinophils Absolute: 0.2 10*3/uL (ref 0.0–0.5)
Eosinophils Relative: 3 %
HCT: 35.9 % — ABNORMAL LOW (ref 36.0–46.0)
Hemoglobin: 11.9 g/dL — ABNORMAL LOW (ref 12.0–15.0)
Immature Granulocytes: 0 %
Lymphocytes Relative: 23 %
Lymphs Abs: 1.5 10*3/uL (ref 0.7–4.0)
MCH: 35 pg — ABNORMAL HIGH (ref 26.0–34.0)
MCHC: 33.1 g/dL (ref 30.0–36.0)
MCV: 105.6 fL — ABNORMAL HIGH (ref 80.0–100.0)
Monocytes Absolute: 0.8 10*3/uL (ref 0.1–1.0)
Monocytes Relative: 12 %
Neutro Abs: 3.8 10*3/uL (ref 1.7–7.7)
Neutrophils Relative %: 61 %
Platelets: 276 10*3/uL (ref 150–400)
RBC: 3.4 MIL/uL — ABNORMAL LOW (ref 3.87–5.11)
RDW: 14.8 % (ref 11.5–15.5)
WBC: 6.3 10*3/uL (ref 4.0–10.5)
nRBC: 0 % (ref 0.0–0.2)

## 2021-08-23 LAB — COMPREHENSIVE METABOLIC PANEL
ALT: 9 U/L (ref 0–44)
AST: 21 U/L (ref 15–41)
Albumin: 3.6 g/dL (ref 3.5–5.0)
Alkaline Phosphatase: 110 U/L (ref 38–126)
Anion gap: 14 (ref 5–15)
BUN: 52 mg/dL — ABNORMAL HIGH (ref 6–20)
CO2: 28 mmol/L (ref 22–32)
Calcium: 9.3 mg/dL (ref 8.9–10.3)
Chloride: 99 mmol/L (ref 98–111)
Creatinine, Ser: 10.79 mg/dL — ABNORMAL HIGH (ref 0.44–1.00)
GFR, Estimated: 4 mL/min — ABNORMAL LOW (ref >60)
Glucose, Bld: 114 mg/dL — ABNORMAL HIGH (ref 70–99)
Potassium: 3.5 mmol/L (ref 3.5–5.1)
Sodium: 141 mmol/L (ref 135–145)
Total Bilirubin: 0.6 mg/dL (ref 0.3–1.2)
Total Protein: 7.7 g/dL (ref 6.5–8.1)

## 2021-08-23 LAB — VITAMIN D 25 HYDROXY (VIT D DEFICIENCY, FRACTURES): Vit D, 25-Hydroxy: 22.38 ng/mL — ABNORMAL LOW (ref 30–100)

## 2021-08-24 DIAGNOSIS — D631 Anemia in chronic kidney disease: Secondary | ICD-10-CM | POA: Diagnosis not present

## 2021-08-24 DIAGNOSIS — Z992 Dependence on renal dialysis: Secondary | ICD-10-CM | POA: Diagnosis not present

## 2021-08-24 DIAGNOSIS — E1129 Type 2 diabetes mellitus with other diabetic kidney complication: Secondary | ICD-10-CM | POA: Diagnosis not present

## 2021-08-24 DIAGNOSIS — N186 End stage renal disease: Secondary | ICD-10-CM | POA: Diagnosis not present

## 2021-08-24 DIAGNOSIS — N2581 Secondary hyperparathyroidism of renal origin: Secondary | ICD-10-CM | POA: Diagnosis not present

## 2021-08-24 DIAGNOSIS — D509 Iron deficiency anemia, unspecified: Secondary | ICD-10-CM | POA: Diagnosis not present

## 2021-08-24 LAB — CANCER ANTIGEN 27.29: CA 27.29: 35.7 U/mL (ref 0.0–38.6)

## 2021-08-24 LAB — CANCER ANTIGEN 15-3: CA 15-3: 28 U/mL — ABNORMAL HIGH (ref 0.0–25.0)

## 2021-08-26 ENCOUNTER — Encounter: Payer: Self-pay | Admitting: Family Medicine

## 2021-08-26 ENCOUNTER — Ambulatory Visit (HOSPITAL_COMMUNITY)
Admission: RE | Admit: 2021-08-26 | Discharge: 2021-08-26 | Disposition: A | Discharge status: Home / Self Care | Payer: Medicare Other | Source: Ambulatory Visit | Attending: Family Medicine | Admitting: Family Medicine

## 2021-08-26 ENCOUNTER — Ambulatory Visit (INDEPENDENT_AMBULATORY_CARE_PROVIDER_SITE_OTHER): Payer: Medicare Other | Admitting: Family Medicine

## 2021-08-26 ENCOUNTER — Other Ambulatory Visit: Payer: Self-pay

## 2021-08-26 VITALS — BP 108/78 | HR 98 | Temp 97.5°F | Resp 18 | Ht 62.0 in | Wt 177.0 lb

## 2021-08-26 DIAGNOSIS — D509 Iron deficiency anemia, unspecified: Secondary | ICD-10-CM | POA: Diagnosis not present

## 2021-08-26 DIAGNOSIS — N186 End stage renal disease: Secondary | ICD-10-CM | POA: Diagnosis not present

## 2021-08-26 DIAGNOSIS — R0600 Dyspnea, unspecified: Secondary | ICD-10-CM

## 2021-08-26 DIAGNOSIS — D631 Anemia in chronic kidney disease: Secondary | ICD-10-CM | POA: Diagnosis not present

## 2021-08-26 DIAGNOSIS — E1129 Type 2 diabetes mellitus with other diabetic kidney complication: Secondary | ICD-10-CM | POA: Diagnosis not present

## 2021-08-26 DIAGNOSIS — N2581 Secondary hyperparathyroidism of renal origin: Secondary | ICD-10-CM | POA: Diagnosis not present

## 2021-08-26 DIAGNOSIS — Z992 Dependence on renal dialysis: Secondary | ICD-10-CM | POA: Diagnosis not present

## 2021-08-26 NOTE — Progress Notes (Signed)
Subjective:    Patient ID: Megan [name], female    DOB: 11/21/1960, 61 y.o.   MRN: [id number] Patient is a 61 year old African-American female with a history of heart failure with preserved ejection fraction, anemia, coronary artery disease with a catheterization in 2020 that showed nonobstructive disease, breast cancer, end-stage renal failure hemodialysis dependent.  She presents today with shortness of breath.  She states that is been present for the last 3 weeks.  It will occur at random times where she will simply feel short of breath for no reason.  She denies any angina.  She denies any pleurisy.  She denies any orthopnea or paroxysmal nocturnal dyspnea.  She had an echocardiogram last year that showed an ejection fraction of 60% and no evidence of reduced ejection fraction.  She denies any fevers or chills.  She had a negative COVID test at dialysis.  She denies any leg swelling.  There is no evidence of fluid overload.  On exam today there is no wheezes crackles or rales.  Pulse oximetry is normal  EKG today is unchanged from her EKG of July 2020 through.  The patient has nonspecific ST depression in lead I and aVL with T wave inversions in aVL.  However this is a chronic finding and was present last year.  She also has nonspecific ST changes in the lateral leads V4 through V6 that is nonspecific but this is also a chronic finding. Past Medical History:  Diagnosis Date   (HFpEF) heart failure with preserved ejection fraction ([place])    a. 01/2019 Echo: EF 55-60%, mild conc LVH. DD.  Torn MV chordae.   Anemia    Atypical chest pain    a. 08/2018 MV: EF 59%, no ischemia; b. 02/2019 Cath: nonobs dzs.   Blood transfusion without reported diagnosis    Breast cancer ([place]) 10/12/2020   Cataract    ESRD (end stage renal disease) on dialysis [facility])    a. HD T, T, S   Essential hypertension, benign    GERD (gastroesophageal reflux disease)    Headache    Hemorrhoids    Mixed hyperlipidemia     Morbid obesity (H[facility])    Non-obstructive CAD (coronary artery disease)    a. 02/2019 CathL LM nl, LAD 38m, LCX nl, RCA 25p, EF 55-65%.   PONV (postoperative nausea and vomiting)    S/P colonoscopy Jan 2011   Dr. Benson Norway: sessile polyp (benign lymphoid), large hemorrhoids, repeat 5-10 years   Temporal arteritis ([place])    Type 2 diabetes mellitus ([place])    Wears glasses    Past Surgical History:  Procedure Laterality Date   ABDOMINAL HYSTERECTOMY     APPENDECTOMY     ARTERY BIOPSY N/A 05/09/2018   Procedure: RIGHT TEMPORAL ARTERY BIOPSY;  Surgeon: Judeth Horn, MD;  Location: [facility];  Service: General;  Laterality: N/A;   BREAST BIOPSY Right 06/15/2020   Procedure: RIGHT BREAST BIOPSY;  Surgeon: Aviva Signs, MD;  [facility];  Service: General;  Laterality: Right;   CATARACT EXTRACTION W/PHACO Left 02/09/2017   Procedure: CATARACT EXTRACTION PHACO AND INTRAOCULAR LENS PLACEMENT LEFT EYE;  Surgeon: Tonny Branch, MD;  [facility];  Service: Ophthalmology;  Laterality: Left;  CDE: 4.89   CATARACT EXTRACTION W/PHACO Right 06/04/2017   Procedure: CATARACT EXTRACTION PHACO AND INTRAOCULAR LENS PLACEMENT (IOC);  Surgeon: Tonny Branch, MD;  [facility];  Service: Ophthalmology;  Laterality: Right;  CDE: 4.12   CHOLECYSTECTOMY  09/29/2011   Procedure: LAPAROSCOPIC CHOLECYSTECTOMY;  Surgeon: Jamesetta So, MD;  [facility];  Service: General;  Laterality: N/A;   COLONOSCOPY  08/2009   Dr. Benson Norway: sessile polyp (benign lymphoid), large hemorrhoids, repeat 5-10 years   COLONOSCOPY N/A 06/12/2016   prominent hemorrhoids   COLONOSCOPY WITH PROPOFOL N/A 06/16/2021   Procedure: COLONOSCOPY WITH PROPOFOL;  Surgeon: Daneil Dolin, MD;  [facility];  Service: Endoscopy;  Laterality: N/A;  9:30am (dialysis pt)   ESOPHAGOGASTRODUODENOSCOPY  09/05/2011   [id number] hiatal hernia; remainder of exam normal. No explanation for patient's abdominal pain with today's examination    ESOPHAGOGASTRODUODENOSCOPY N/A 12/17/2013   Dr. [name]: gastric erythema, erosion, mild chronic inflammation on path    ESOPHAGOGASTRODUODENOSCOPY (EGD) WITH PROPOFOL N/A 06/16/2021   Procedure: ESOPHAGOGASTRODUODENOSCOPY (EGD) WITH PROPOFOL;  Surgeon: Daneil Dolin, MD;  [facility];  Service: Endoscopy;  Laterality: N/A;   EXCISION OF BREAST BIOPSY Right 10/12/2020   Procedure: EXCISION OF RIGHT BREAST BIOPSY;  Surgeon: Aviva Signs, MD;  [facility];  Service: General;  Laterality: Right;   LAPAROSCOPIC APPENDECTOMY  09/29/2011   Procedure: APPENDECTOMY LAPAROSCOPIC;  Surgeon: Jamesetta So, MD;  [facility];  Service: General;;  incidental appendectomy   LEFT HEART CATH AND CORONARY ANGIOGRAPHY N/A 02/28/2019   Procedure: LEFT HEART CATH AND CORONARY ANGIOGRAPHY;  Surgeon: Jettie Booze, MD;  Location: [facility];  Service: Cardiovascular;  Laterality: N/A;   MASTECTOMY MODIFIED RADICAL Right 02/18/2020   Procedure: MASTECTOMY MODIFIED RADICAL;  Surgeon: Aviva Signs, MD;  [facility];  Service: General;  Laterality: Right;   MASTECTOMY, PARTIAL Right 07/13/2020   Procedure: RIGHT PARTIAL MASTECTOMY;  Surgeon: Aviva Signs, MD;  [facility];  Service: General;  Laterality: Right;   PARTIAL MASTECTOMY WITH NEEDLE LOCALIZATION AND AXILLARY SENTINEL LYMPH NODE BX Right 09/18/2018   Procedure: RIGHT PARTIAL MASTECTOMY AFTER NEEDLE LOCALIZATION, SENTINEL LYMPH NODE BIOPSY RIGHT AXILLA;  Surgeon: Aviva Signs, MD;  [facility];  Service: General;  Laterality: Right;   POLYPECTOMY  06/16/2021   Procedure: POLYPECTOMY;  Surgeon: Daneil Dolin, MD;  [facility];  Service: Endoscopy;;   SIMPLE MASTECTOMY WITH AXILLARY SENTINEL NODE BIOPSY Left 06/15/2020   Procedure: LEFT SIMPLE MASTECTOMY;  Surgeon: Aviva Signs, MD;  [facility];  Service: General;  Laterality: Left;   Current Outpatient Medications on File Prior to Visit   Medication Sig Dispense Refill   acetaminophen (TYLENOL) 500 MG tablet Take 500 mg by mouth every 8 (eight) hours as needed.     amLODipine (NORVASC) 10 MG tablet TAKE 1 TABLET BY MOUTH AT BEDTIME 90 tablet 3   anastrozole (ARIMIDEX) 1 MG tablet TAKE 1 TABLET(1 MG) BY MOUTH DAILY 30 tablet 4   anastrozole (ARIMIDEX) 1 MG tablet TAKE 1 TABLET(1 MG) BY MOUTH DAILY 30 tablet 4   aspirin EC 81 MG tablet Take 81 mg by mouth daily.     atorvastatin (LIPITOR) 20 MG tablet TAKE 1 TABLET DAILY (NEEDS OFFICE VISIT) 90 tablet 3   B Complex-C-Folic Acid (DIALYVITE 097) 0.8 MG TABS Take 1 tablet by mouth daily.     cinacalcet (SENSIPAR) 30 MG tablet Take 2 tablets (60 mg total) by mouth daily. 90 tablet 0   Darbepoetin Alfa (ARANESP, ALBUMIN FREE, IJ) Take once a week with dialysis unsure of dose     DULoxetine (CYMBALTA) 30 MG capsule TAKE 1 CAPSULE(30 MG) BY MOUTH DAILY 90 capsule 1   gabapentin (NEURONTIN) 300 MG capsule TAKE  1 CAPSULE(300 MG) BY MOUTH AT BEDTIME 90 capsule 1   HYDROcodone-acetaminophen (NORCO/VICODIN) 5-325 MG tablet Take 1 tablet by mouth every 6 (six) hours as needed. 20 tablet 0   lanthanum (FOSRENOL) 1000 MG chewable tablet Chew 2,000-3,000 mg by mouth See admin instructions. Take 3 tablets (3000 mg) by mouth with meals and take 2 tablets (2000 mg) with snacks     lidocaine-prilocaine (EMLA) cream Apply 1 application topically every Monday, Wednesday, and Friday with hemodialysis.     nebivolol (BYSTOLIC) 10 MG tablet TAKE 1 TABLET IN THE MORNING AND AT BEDTIME 180 tablet 3   pantoprazole (PROTONIX) 40 MG tablet TAKE 1 TABLET(40 MG) BY MOUTH DAILY 90 tablet 3   No current facility-administered medications on file prior to visit.   No Known Allergies Social History   Socioeconomic History   Marital status: Married    Spouse name: Not on file   Number of children: Not on file   Years of education: Not on file   Highest education level: Not on file  Occupational History    Occupation: Merchandiser, retail: [id number]  Tobacco Use   Smoking status: Never   Smokeless tobacco: Never  Vaping Use   Vaping Use: Never used  Substance and Sexual Activity   Alcohol use: No   Drug use: No   Sexual activity: Yes    Birth control/protection: Surgical  Other Topics Concern   Not on file  Social History Narrative   Works at [employer] in [place].    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   Also has to get items from high shelves-causes achy pain in shoulder area      Married.   Children are grown, out of house.   Social Determinants of Health   Financial Resource Strain: Not on file  Food Insecurity: Not on file  Transportation Needs: Not on file  Physical Activity: Not on file  Stress: Not on file  Social Connections: Not on file  Intimate Partner Violence: Not At Risk   Fear of Current or Ex-Partner: No   Emotionally Abused: No   Physically Abused: No   Sexually Abused: No    Past Medical History:  Diagnosis Date   (HFpEF) heart failure with preserved ejection fraction ([place])    a. 01/2019 Echo: EF 55-60%, mild conc LVH. DD.  Torn MV chordae.   Anemia    Atypical chest pain    a. 08/2018 MV: EF 59%, no ischemia; b. 02/2019 Cath: nonobs dzs.   Blood transfusion without reported diagnosis    Breast cancer ([place]) 10/12/2020   Cataract    ESRD (end stage renal disease) on dialysis [facility])    a. HD T, T, S   Essential hypertension, benign    GERD (gastroesophageal reflux disease)    Headache    Hemorrhoids    Mixed hyperlipidemia    Morbid obesity ([facility])    Non-obstructive CAD (coronary artery disease)    a. 02/2019 CathL LM nl, LAD 67m, LCX nl, RCA 25p, EF 55-65%.   PONV (postoperative nausea and vomiting)    S/P colonoscopy Jan 2011   Dr. Benson Norway: sessile polyp (benign lymphoid), large hemorrhoids, repeat 5-10 years   Temporal arteritis ([place])    Type 2 diabetes mellitus ([place])    Wears glasses    Past Surgical History:   Procedure Laterality Date   ABDOMINAL HYSTERECTOMY     APPENDECTOMY  ARTERY BIOPSY N/A 05/09/2018   Procedure: RIGHT TEMPORAL ARTERY BIOPSY;  Surgeon: Judeth Horn, MD;  Location: [facility];  Service: General;  Laterality: N/A;   BREAST BIOPSY Right 06/15/2020   Procedure: RIGHT BREAST BIOPSY;  Surgeon: Aviva Signs, MD;  [facility];  Service: General;  Laterality: Right;   CATARACT EXTRACTION W/PHACO Left 02/09/2017   Procedure: CATARACT EXTRACTION PHACO AND INTRAOCULAR LENS PLACEMENT LEFT EYE;  Surgeon: Tonny Branch, MD;  [facility];  Service: Ophthalmology;  Laterality: Left;  CDE: 4.89   CATARACT EXTRACTION W/PHACO Right 06/04/2017   Procedure: CATARACT EXTRACTION PHACO AND INTRAOCULAR LENS PLACEMENT (IOC);  Surgeon: Tonny Branch, MD;  [facility];  Service: Ophthalmology;  Laterality: Right;  CDE: 4.12   CHOLECYSTECTOMY  09/29/2011   Procedure: LAPAROSCOPIC CHOLECYSTECTOMY;  Surgeon: Jamesetta So, MD;  [facility];  Service: General;  Laterality: N/A;   COLONOSCOPY  08/2009   Dr. Benson Norway: sessile polyp (benign lymphoid), large hemorrhoids, repeat 5-10 years   COLONOSCOPY N/A 06/12/2016   prominent hemorrhoids   COLONOSCOPY WITH PROPOFOL N/A 06/16/2021   Procedure: COLONOSCOPY WITH PROPOFOL;  Surgeon: Daneil Dolin, MD;  [facility];  Service: Endoscopy;  Laterality: N/A;  9:30am (dialysis pt)   ESOPHAGOGASTRODUODENOSCOPY  09/05/2011   DPO:EUMPN hiatal hernia; remainder of exam normal. No explanation for patient's abdominal pain with today's examination   ESOPHAGOGASTRODUODENOSCOPY N/A 12/17/2013   Dr. [name]: gastric erythema, erosion, mild chronic inflammation on path    ESOPHAGOGASTRODUODENOSCOPY (EGD) WITH PROPOFOL N/A 06/16/2021   Procedure: ESOPHAGOGASTRODUODENOSCOPY (EGD) WITH PROPOFOL;  Surgeon: Daneil Dolin, MD;  [facility];  Service: Endoscopy;  Laterality: N/A;   EXCISION OF BREAST BIOPSY Right 10/12/2020   Procedure: EXCISION  OF RIGHT BREAST BIOPSY;  Surgeon: Aviva Signs, MD;  [facility];  Service: General;  Laterality: Right;   LAPAROSCOPIC APPENDECTOMY  09/29/2011   Procedure: APPENDECTOMY LAPAROSCOPIC;  Surgeon: Jamesetta So, MD;  [facility];  Service: General;;  incidental appendectomy   LEFT HEART CATH AND CORONARY ANGIOGRAPHY N/A 02/28/2019   Procedure: LEFT HEART CATH AND CORONARY ANGIOGRAPHY;  Surgeon: Jettie Booze, MD;  Location: [facility];  Service: Cardiovascular;  Laterality: N/A;   MASTECTOMY MODIFIED RADICAL Right 02/18/2020   Procedure: MASTECTOMY MODIFIED RADICAL;  Surgeon: Aviva Signs, MD;  [facility];  Service: General;  Laterality: Right;   MASTECTOMY, PARTIAL Right 07/13/2020   Procedure: RIGHT PARTIAL MASTECTOMY;  Surgeon: Aviva Signs, MD;  [facility];  Service: General;  Laterality: Right;   PARTIAL MASTECTOMY WITH NEEDLE LOCALIZATION AND AXILLARY SENTINEL LYMPH NODE BX Right 09/18/2018   Procedure: RIGHT PARTIAL MASTECTOMY AFTER NEEDLE LOCALIZATION, SENTINEL LYMPH NODE BIOPSY RIGHT AXILLA;  Surgeon: Aviva Signs, MD;  [facility];  Service: General;  Laterality: Right;   POLYPECTOMY  06/16/2021   Procedure: POLYPECTOMY;  Surgeon: Daneil Dolin, MD;  [facility];  Service: Endoscopy;;   SIMPLE MASTECTOMY WITH AXILLARY SENTINEL NODE BIOPSY Left 06/15/2020   Procedure: LEFT SIMPLE MASTECTOMY;  Surgeon: Aviva Signs, MD;  [facility];  Service: General;  Laterality: Left;   Current Outpatient Medications on File Prior to Visit  Medication Sig Dispense Refill   acetaminophen (TYLENOL) 500 MG tablet Take 500 mg by mouth every 8 (eight) hours as needed.     amLODipine (NORVASC) 10 MG tablet TAKE 1 TABLET BY MOUTH AT BEDTIME 90 tablet 3   anastrozole (ARIMIDEX) 1 MG tablet TAKE 1 TABLET(1 MG)  BY MOUTH DAILY 30 tablet 4   anastrozole (ARIMIDEX) 1 MG tablet TAKE 1 TABLET(1 MG) BY MOUTH DAILY 30 tablet 4   aspirin EC 81 MG tablet  Take 81 mg by mouth daily.     atorvastatin (LIPITOR) 20 MG tablet TAKE 1 TABLET DAILY (NEEDS OFFICE VISIT) 90 tablet 3   B Complex-C-Folic Acid (DIALYVITE 637) 0.8 MG TABS Take 1 tablet by mouth daily.     cinacalcet (SENSIPAR) 30 MG tablet Take 2 tablets (60 mg total) by mouth daily. 90 tablet 0   Darbepoetin Alfa (ARANESP, ALBUMIN FREE, IJ) Take once a week with dialysis unsure of dose     DULoxetine (CYMBALTA) 30 MG capsule TAKE 1 CAPSULE(30 MG) BY MOUTH DAILY 90 capsule 1   gabapentin (NEURONTIN) 300 MG capsule TAKE 1 CAPSULE(300 MG) BY MOUTH AT BEDTIME 90 capsule 1   HYDROcodone-acetaminophen (NORCO/VICODIN) 5-325 MG tablet Take 1 tablet by mouth every 6 (six) hours as needed. 20 tablet 0   lanthanum (FOSRENOL) 1000 MG chewable tablet Chew 2,000-3,000 mg by mouth See admin instructions. Take 3 tablets (3000 mg) by mouth with meals and take 2 tablets (2000 mg) with snacks     lidocaine-prilocaine (EMLA) cream Apply 1 application topically every Monday, Wednesday, and Friday with hemodialysis.     nebivolol (BYSTOLIC) 10 MG tablet TAKE 1 TABLET IN THE MORNING AND AT BEDTIME 180 tablet 3   pantoprazole (PROTONIX) 40 MG tablet TAKE 1 TABLET(40 MG) BY MOUTH DAILY 90 tablet 3   No current facility-administered medications on file prior to visit.   No Known Allergies Social History   Socioeconomic History   Marital status: Married    Spouse name: Not on file   Number of children: Not on file   Years of education: Not on file   Highest education level: Not on file  Occupational History   Occupation: Merchandiser, retail: [id number]  Tobacco Use   Smoking status: Never   Smokeless tobacco: Never  Vaping Use   Vaping Use: Never used  Substance and Sexual Activity   Alcohol use: No   Drug use: No   Sexual activity: Yes    Birth control/protection: Surgical  Other Topics Concern   Not on file  Social History Narrative   Works at [employer] in [place].    When trucks come, she  has to put items in their places.   Also has to get items from high shelves-causes achy pain in shoulder area      Married.   Children are grown, out of house.   Social Determinants of Health   Financial Resource Strain: Not on file  Food Insecurity: Not on file  Transportation Needs: Not on file  Physical Activity: Not on file  Stress: Not on file  Social Connections: Not on file  Intimate Partner Violence: Not At Risk   Fear of Current or Ex-Partner: No   Emotionally Abused: No   Physically Abused: No   Sexually Abused: No      Review of Systems  Respiratory:  Positive for cough.   All other systems reviewed and are negative.     Objective:   Physical Exam Vitals reviewed.  Constitutional:      General: She is not in acute distress.    Appearance: Normal appearance. She is normal weight.  Cardiovascular:     Rate and Rhythm: Normal rate and regular rhythm.     Pulses: Normal pulses.  Heart sounds: Murmur (thrill from AV fistual heard as murmur) heard.  Pulmonary:     Effort: Pulmonary effort is normal. No respiratory distress.     Breath sounds: Normal breath sounds. No stridor. No wheezing, rhonchi or rales.  Chest:     Chest wall: No tenderness.  Musculoskeletal:     Right lower leg: No edema.     Left lower leg: No edema.  Neurological:     Mental Status: She is alert.          Assessment & Plan:  Dyspnea, unspecified type - Plan: EKG 12-Lead EKG is reassuring and that it does not show any significant change from last year.  Physical exam does not show any explanation for her dyspnea.  She had blood work on January 10 which showed a hemoglobin of 11.9 but given her chronic kidney disease is normal.  Therefore I am not certain of the cause of her shortness of breath.  I would like to send the patient for chest x-ray today.  If the chest x-ray is normal, and CBC and BNP are normal, will consider referring the patient back to cardiology given her history  of nonobstructive disease to see if this has worsened.  However, I feel this is unlikely because her catheterization in 2020 showed less than 10% blockage in 1 coronary artery and less than 25% blockage another.

## 2021-08-27 ENCOUNTER — Encounter (HOSPITAL_COMMUNITY): Payer: Self-pay | Admitting: Hematology

## 2021-08-27 LAB — CBC WITH DIFFERENTIAL/PLATELET
Absolute Monocytes: 728 cells/uL (ref 200–950)
Basophils Absolute: 56 cells/uL (ref 0–200)
Basophils Relative: 0.7 %
Eosinophils Absolute: 176 cells/uL (ref 15–500)
Eosinophils Relative: 2.2 %
HCT: 36.5 % (ref 35.0–45.0)
Hemoglobin: 12.2 g/dL (ref 11.7–15.5)
Lymphs Abs: 1272 cells/uL (ref 850–3900)
MCH: 33.3 pg — ABNORMAL HIGH (ref 27.0–33.0)
MCHC: 33.4 g/dL (ref 32.0–36.0)
MCV: 99.7 fL (ref 80.0–100.0)
MPV: 10.7 fL (ref 7.5–12.5)
Monocytes Relative: 9.1 %
Neutro Abs: 5768 cells/uL (ref 1500–7800)
Neutrophils Relative %: 72.1 %
Platelets: 280 10*3/uL (ref 140–400)
RBC: 3.66 10*6/uL — ABNORMAL LOW (ref 3.80–5.10)
RDW: 14 % (ref 11.0–15.0)
Total Lymphocyte: 15.9 %
WBC: 8 10*3/uL (ref 3.8–10.8)

## 2021-08-27 LAB — BASIC METABOLIC PANEL WITH GFR
BUN/Creatinine Ratio: 3 (calc) — ABNORMAL LOW (ref 6–22)
BUN: 22 mg/dL (ref 7–25)
CO2: 30 mmol/L (ref 20–32)
Calcium: 9.8 mg/dL (ref 8.6–10.4)
Chloride: 95 mmol/L — ABNORMAL LOW (ref 98–110)
Creat: 6.69 mg/dL — ABNORMAL HIGH (ref 0.50–1.03)
Glucose, Bld: 157 mg/dL — ABNORMAL HIGH (ref 65–99)
Potassium: 3.4 mmol/L — ABNORMAL LOW (ref 3.5–5.3)
Sodium: 140 mmol/L (ref 135–146)
eGFR: 7 mL/min/{1.73_m2} — ABNORMAL LOW (ref >=60)

## 2021-08-27 LAB — BRAIN NATRIURETIC PEPTIDE: Brain Natriuretic Peptide: 123 pg/mL — ABNORMAL HIGH (ref <100)

## 2021-08-29 ENCOUNTER — Telehealth: Payer: Self-pay

## 2021-08-29 DIAGNOSIS — N186 End stage renal disease: Secondary | ICD-10-CM | POA: Diagnosis not present

## 2021-08-29 DIAGNOSIS — D509 Iron deficiency anemia, unspecified: Secondary | ICD-10-CM | POA: Diagnosis not present

## 2021-08-29 DIAGNOSIS — D631 Anemia in chronic kidney disease: Secondary | ICD-10-CM | POA: Diagnosis not present

## 2021-08-29 DIAGNOSIS — N2581 Secondary hyperparathyroidism of renal origin: Secondary | ICD-10-CM | POA: Diagnosis not present

## 2021-08-29 DIAGNOSIS — Z992 Dependence on renal dialysis: Secondary | ICD-10-CM | POA: Diagnosis not present

## 2021-08-29 DIAGNOSIS — E1129 Type 2 diabetes mellitus with other diabetic kidney complication: Secondary | ICD-10-CM | POA: Diagnosis not present

## 2021-08-29 NOTE — Telephone Encounter (Signed)
-----   Message from [name], MD sent at 08/29/2021  6:42 AM EST ----- Labs and cxr show no cause for shortness of breath.  BNP is mildly elevated so would recommend follow up with her cardiologist for possible repeat echo.  Please schedule once patient aware.

## 2021-08-29 NOTE — Telephone Encounter (Signed)
Left message for patient to call back regarding results and recommendations. °

## 2021-08-30 ENCOUNTER — Other Ambulatory Visit: Payer: Self-pay

## 2021-08-30 ENCOUNTER — Encounter (HOSPITAL_COMMUNITY): Payer: Self-pay | Admitting: Hematology

## 2021-08-30 ENCOUNTER — Inpatient Hospital Stay (HOSPITAL_BASED_OUTPATIENT_CLINIC_OR_DEPARTMENT_OTHER): Payer: Medicare Other | Admitting: Hematology

## 2021-08-30 VITALS — BP 132/72 | HR 86 | Temp 97.3°F | Resp 18 | Wt 181.4 lb

## 2021-08-30 DIAGNOSIS — Z7982 Long term (current) use of aspirin: Secondary | ICD-10-CM | POA: Diagnosis not present

## 2021-08-30 DIAGNOSIS — Z79899 Other long term (current) drug therapy: Secondary | ICD-10-CM | POA: Insufficient documentation

## 2021-08-30 DIAGNOSIS — K625 Hemorrhage of anus and rectum: Secondary | ICD-10-CM | POA: Diagnosis not present

## 2021-08-30 DIAGNOSIS — C50211 Malignant neoplasm of upper-inner quadrant of right female breast: Secondary | ICD-10-CM | POA: Diagnosis not present

## 2021-08-30 DIAGNOSIS — Z170 Estrogen receptor positive status [ER+]: Secondary | ICD-10-CM

## 2021-08-30 DIAGNOSIS — R1032 Left lower quadrant pain: Secondary | ICD-10-CM | POA: Diagnosis not present

## 2021-08-30 DIAGNOSIS — R109 Unspecified abdominal pain: Secondary | ICD-10-CM | POA: Diagnosis not present

## 2021-08-30 DIAGNOSIS — I700 Atherosclerosis of aorta: Secondary | ICD-10-CM | POA: Diagnosis not present

## 2021-08-30 NOTE — Progress Notes (Signed)
[facility] [address] [place] [address]   Patient Care Team: [name], MD as PCP - General (Family Medicine) [name], [name], MD as PCP - Cardiology (Cardiology) [name] [name], MD (Gastroenterology) [name], MD as Consulting Physician (Cardiology) [name], RN as Oncology Nurse Navigator (Oncology)  SUMMARY OF ONCOLOGIC HISTORY: Oncology History  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[place])  09/18/2018 Initial Diagnosis   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[place])   09/26/2018 Cancer Staging   Staging form: Breast, AJCC 8th Edition - Clinical stage from 09/26/2018: Stage IB (cT1c, cN0, cM0, G3, ER+, PR-, HER2-) - Signed by Derek Jack, MD on 09/26/2018    10/30/2018 -  Chemotherapy   The patient had DOXOrubicin (ADRIAMYCIN) chemo injection 108 mg, 60 mg/m2 = 108 mg, Intravenous,  Once, 4 of 4 cycles Administration: 108 mg (10/30/2018), 108 mg (11/20/2018), 108 mg (12/11/2018), 108 mg (01/01/2019) palonosetron (ALOXI) injection 0.25 mg, 0.25 mg, Intravenous,  Once, 4 of 4 cycles Administration: 0.25 mg (10/30/2018), 0.25 mg (11/20/2018), 0.25 mg (12/11/2018), 0.25 mg (01/01/2019) pegfilgrastim (NEULASTA ONPRO KIT) injection 6 mg, 6 mg, Subcutaneous, Once, 4 of 4 cycles Administration: 6 mg (10/30/2018), 6 mg (11/20/2018), 6 mg (12/11/2018), 6 mg (01/01/2019) cyclophosphamide (CYTOXAN) 820 mg in sodium chloride 0.9 % 250 mL chemo infusion, 450 mg/m2 = 820 mg (75 % of original dose 600 mg/m2), Intravenous,  Once, 4 of 4 cycles Dose modification: 450 mg/m2 (75 % of original dose 600 mg/m2, Cycle 1, Reason: Other (see comments), Comment: hemodialysis) Administration: 820 mg (10/30/2018), 820 mg (11/20/2018), 820 mg (12/11/2018), 820 mg (01/01/2019) fosaprepitant (EMEND) 150 mg, dexamethasone (DECADRON) 12 mg in sodium chloride 0.9 % 145 mL IVPB, , Intravenous,  Once, 4 of 4  cycles Administration:  (10/30/2018),  (11/20/2018),  (12/11/2018),  (01/01/2019)   for chemotherapy treatment.       CHIEF COMPLIANT: Follow-up of right breast cancer   INTERVAL HISTORY: Megan [name] is a 61 y.o. female here today for follow up of her right breast cancer. Her last visit was on 05/24/2021.   Today she reports feeling well. She reports sharp pain in her breast bilaterally lasting for a few seconds and occurring intermittently daily over the past week; she reports the pain is worst on the right and is helped with pressure and tylenol. The pain is not changed with movement, positional changes, or deep breathing. She denies any recent injury or infections. She reports SOB. She takes vitamin D on the days she received dialysis.  She is taking Gabapentin at night.   REVIEW OF SYSTEMS:   Review of Systems  Constitutional:  Negative for appetite change and fatigue.  Respiratory:  Positive for shortness of breath.   Cardiovascular:  Positive for chest pain.  Gastrointestinal:  Positive for blood in stool (rectal bleeding).  Musculoskeletal:  Positive for back pain (10/10).  Neurological:  Positive for dizziness and headaches.  All other systems reviewed and are negative.  I have reviewed the past medical history, past surgical history, social history and family history with the patient and they are unchanged from previous note.   ALLERGIES:   has No Known Allergies.   MEDICATIONS:  Current Outpatient Medications  Medication Sig Dispense Refill   acetaminophen (TYLENOL) 500 MG tablet Take 500 mg by mouth every 8 (eight) hours as needed.     amLODipine (NORVASC) 10 MG tablet TAKE 1 TABLET BY  MOUTH AT BEDTIME 90 tablet 3   anastrozole (ARIMIDEX) 1 MG tablet TAKE 1 TABLET(1 MG) BY MOUTH DAILY 30 tablet 4   anastrozole (ARIMIDEX) 1 MG tablet TAKE 1 TABLET(1 MG) BY MOUTH DAILY 30 tablet 4   aspirin EC 81 MG tablet Take 81 mg by mouth daily.     atorvastatin (LIPITOR)  20 MG tablet TAKE 1 TABLET DAILY (NEEDS OFFICE VISIT) 90 tablet 3   B Complex-C-Folic Acid (DIALYVITE 735) 0.8 MG TABS Take 1 tablet by mouth daily.     cinacalcet (SENSIPAR) 30 MG tablet Take 2 tablets (60 mg total) by mouth daily. 90 tablet 0   Darbepoetin Alfa (ARANESP, ALBUMIN FREE, IJ) Take once a week with dialysis unsure of dose     DULoxetine (CYMBALTA) 30 MG capsule TAKE 1 CAPSULE(30 MG) BY MOUTH DAILY 90 capsule 1   gabapentin (NEURONTIN) 300 MG capsule TAKE 1 CAPSULE(300 MG) BY MOUTH AT BEDTIME 90 capsule 1   HYDROcodone-acetaminophen (NORCO/VICODIN) 5-325 MG tablet Take 1 tablet by mouth every 6 (six) hours as needed. 20 tablet 0   lanthanum (FOSRENOL) 1000 MG chewable tablet Chew 2,000-3,000 mg by mouth See admin instructions. Take 3 tablets (3000 mg) by mouth with meals and take 2 tablets (2000 mg) with snacks     lidocaine-prilocaine (EMLA) cream Apply 1 application topically every Monday, Wednesday, and Friday with hemodialysis.     nebivolol (BYSTOLIC) 10 MG tablet TAKE 1 TABLET IN THE MORNING AND AT BEDTIME 180 tablet 3   pantoprazole (PROTONIX) 40 MG tablet TAKE 1 TABLET(40 MG) BY MOUTH DAILY 90 tablet 3   No current facility-administered medications for this visit.     PHYSICAL EXAMINATION: Performance status (ECOG): 1 - Symptomatic but completely ambulatory  Vitals:   08/30/21 1529  BP: 132/72  Pulse: 86  Resp: 18  Temp: (!) 97.3 F (36.3 C)  SpO2: 98%   Wt Readings from Last 3 Encounters:  08/30/21 181 lb 6.4 oz (82.3 kg)  08/26/21 177 lb (80.3 kg)  07/10/21 177 lb (80.3 kg)   Physical Exam Vitals reviewed.  Constitutional:      Appearance: Normal appearance. She is obese.  Cardiovascular:     Rate and Rhythm: Normal rate and regular rhythm.     Pulses: Normal pulses.     Heart sounds: Normal heart sounds.  Pulmonary:     Effort: Pulmonary effort is normal.     Breath sounds: Normal breath sounds.  Chest:  Breasts:    Right: Tenderness (rib  tenderness) present. No swelling, bleeding, mass or skin change.     Left: No swelling, bleeding, mass, skin change or tenderness.  Abdominal:     Palpations: Abdomen is soft. There is no hepatomegaly, splenomegaly or mass.     Tenderness: There is no abdominal tenderness.  Lymphadenopathy:     Upper Body:     Right upper body: No supraclavicular, axillary or pectoral adenopathy.     Left upper body: No supraclavicular, axillary or pectoral adenopathy.  Neurological:     General: No focal deficit present.     Mental Status: She is alert and oriented to person, place, and time.  Psychiatric:        Mood and Affect: Mood normal.        Behavior: Behavior normal.    Breast Exam Chaperone: Thana Ates     LABORATORY DATA:  I have reviewed the data as listed CMP Latest Ref Rng & Units 08/26/2021 08/23/2021 06/16/2021  Glucose 65 -  99 mg/dL 157(H) 114(H) 105(H)  BUN 7 - 25 mg/dL 22 52(H) 50(H)  Creatinine 0.50 - 1.03 mg/dL 6.69(H) 10.79(H) 10.50(H)  Sodium 135 - 146 mmol/L 140 141 132(L)  Potassium 3.5 - 5.3 mmol/L 3.4(L) 3.5 3.9  Chloride 98 - 110 mmol/L 95(L) 99 95(L)  CO2 20 - 32 mmol/L 30 28 -  Calcium 8.6 - 10.4 mg/dL 9.8 9.3 -  Total Protein 6.5 - 8.1 g/dL - 7.7 -  Total Bilirubin 0.3 - 1.2 mg/dL - 0.6 -  Alkaline Phos 38 - 126 U/L - 110 -  AST 15 - 41 U/L - 21 -  ALT 0 - 44 U/L - 9 -   Lab Results  Component Value Date   CAN153 28.0 (H) 08/23/2021   Lab Results  Component Value Date   WBC 8.0 08/26/2021   HGB 12.2 08/26/2021   HCT 36.5 08/26/2021   MCV 99.7 08/26/2021   PLT 280 08/26/2021   NEUTROABS 5,768 08/26/2021    ASSESSMENT:  1.  Stage Ib (T1CN0) right breast cancer, ER positive, PR and HER-2 negative: -Right lumpectomy and sentinel lymph node biopsy on 09/18/2018 shows 1.5 cm IDC, grade 3, associated with high-grade DCIS, negative margins, 0/3 lymph nodes positive, ER 50%, PR negative and HER-2 negative, Ki-67 60%. -Oncotype DX recurrence score 47.  Distant  recurrence at 9 years with tamoxifen alone is 36%.  Absolute chemotherapy benefit is more than 15%. -Adjuvant chemotherapy with 4 cycles of AC from 10/30/2018 through 01/01/2019. -She was started on anastrozole in June 2020. -Mammogram on 09/02/2019 showed calcifications in the posterior aspect of upper outer quadrant of the right breast. -Right mastectomy on 02/18/2020 shows high-grade DCIS, 1.5 cm, no invasive carcinoma, resection margins negative.  0/11 lymph nodes.  ER 30% positive, PR negative. -Left simple mastectomy on 06/15/2020 was benign.  Right breast biopsy shows microscopic focus of invasive ductal carcinoma in the background of extensive high-grade DCIS. -Right breast lumpectomy on 07/13/2020 shows multifocal invasive ductal carcinoma, grade 3, largest measuring 1.2 cm.  Extensive high-grade DCIS with necrosis.  Margins are free.  PT1CNX.  There are multiple foci of invasive carcinoma arising from extensive DCIS.  Several small foci of invasive tumor arising from DCIS indicating new primaries.  ER/PR/HER-2 is negative.  Ki-67 is 20%. -PET scan on 09/14/2020 shows areas of nodularity with discrete nodule in the fat of the inferior chest wall within the subcutaneous tissues. -Ultrasound of the right chest wall shows masslike abnormality in the upper inner aspect of the right anterior chest at 1 o'clock position measuring 4.1 cm in greatest dimension.  1.7 cm hypoechoic mass in the central anterior right chest concerning for malignancy.  Possible borderline enlarged residual right axillary lymph node. -Right breast biopsy on 10/12/2020 shows 1.3 cm invasive ductal carcinoma, focally 0.1 cm from superior margin.  DCIS is less than 0.1 cm from superior margin.  PT1CPNX. - Right mastectomy followed by radiation therapy completed in May 2022.   2.  Osteopenia: -Bone density on 01/20/2019 shows T score -1.9.   PLAN:  1.  Recurrent right breast cancer, triple receptor negative: - She is tolerating  anastrozole very well.  This is being continued as her initial cancer was ER positive. - She reported sharp pains in the right mastectomy site in the last 1 week which happens about once a day and last few seconds.  She also has some sharp pains in the left simple mastectomy site, not so pronounced. - Physical examination shows tender  areas in the ribs on the right side.  No clear masses palpable in the right chest wall.  No tenderness on the left chest wall.  No adenopathy in axillary or supraclavicular region. - Reviewed labs from 08/23/2021 which showed normal LFTs.  CBC was grossly normal.  Tumor marker CA 15-3 was slightly elevated at 28.  However she does not have any baseline values prior to this. - Based on her new symptom, recommended bone scan. - We have discussed her that pain medication is unlikely to help as this pain is episodic and only last few seconds.  We will follow-up on it after the bone scan.     2.  Osteopenia: - DEXA scan on 03/24/2021 with T score -2.4. - She is receiving vitamin D tablets at the time of dialysis. - We checked a vitamin D level which was low at 22 on 08/23/2021.  Reportedly the dose was increased at the dialysis center.   3.  ESRD on HD: - Continue HD on Monday, Wednesday and Friday under the direction of Dr. Marval Regal.   4.  Neuropathy: - Continue gabapentin at bedtime for burning sensation in the bottom of the feet.  Breast Cancer therapy associated bone loss: I have recommended calcium, Vitamin D and weight bearing exercises.  Orders placed this encounter:  No orders of the defined types were placed in this encounter.   The patient has a good understanding of the overall plan. She agrees with it. She will call with any problems that may develop before the next visit here.  Derek Jack, MD [place] [phone]   I, Thana Ates, am acting as a scribe for Dr. Derek Jack.  I, Derek Jack MD, have  reviewed the above documentation for accuracy and completeness, and I agree with the above.

## 2021-08-30 NOTE — Patient Instructions (Addendum)
[facility] at [facility] Discharge Instructions  You were seen and examined today by Dr. Delton Coombes. He reviewed your most recent labs and scans. Continue taking the Anastrozole as prescribed and the Vitamin D at dialysis. Please keep follow up appointment as scheduled.   Thank you for choosing [facility] at [facility] to provide your oncology and hematology care.  To afford each patient quality time with our provider, please arrive at least 15 minutes before your scheduled appointment time.   If you have a lab appointment with the [facility] please come in thru the Main Entrance and check in at the main information desk.  You need to re-schedule your appointment should you arrive 10 or more minutes late.  We strive to give you quality time with our providers, and arriving late affects you and other patients whose appointments are after yours.  Also, if you no show three or more times for appointments you may be dismissed from the clinic at the providers discretion.     Again, thank you for choosing [facility].  Our hope is that these requests will decrease the amount of time that you wait before being seen by our physicians.       _____________________________________________________________  Should you have questions after your visit to [facility], please contact our office at [phone] and follow the prompts.  Our office hours are 8:00 a.m. and 4:30 p.m. Monday - Friday.  Please note that voicemails left after 4:00 p.m. may not be returned until the following business day.  We are closed weekends and major holidays.  You do have access to a nurse 24-7, just call the main number to the clinic [phone] and do not press any options, hold on the line and a nurse will answer the phone.    For prescription refill requests, have your pharmacy contact our office and allow 72 hours.    Due to Covid, you will need to  wear a mask upon entering the hospital. If you do not have a mask, a mask will be given to you at the Main Entrance upon arrival. For doctor visits, patients may have 1 support person age 22 or older with them. For treatment visits, patients can not have anyone with them due to social distancing guidelines and our immunocompromised population.

## 2021-08-31 ENCOUNTER — Emergency Department (HOSPITAL_COMMUNITY): Payer: Medicare Other

## 2021-08-31 ENCOUNTER — Encounter: Payer: Self-pay | Admitting: Internal Medicine

## 2021-08-31 ENCOUNTER — Encounter (HOSPITAL_COMMUNITY): Payer: Self-pay

## 2021-08-31 ENCOUNTER — Other Ambulatory Visit: Payer: Self-pay

## 2021-08-31 ENCOUNTER — Emergency Department (HOSPITAL_COMMUNITY)
Admission: EM | Admit: 2021-08-31 | Discharge: 2021-08-31 | Disposition: A | Discharge status: Home / Self Care | Payer: Medicare Other | Attending: Emergency Medicine | Admitting: Emergency Medicine

## 2021-08-31 DIAGNOSIS — K625 Hemorrhage of anus and rectum: Secondary | ICD-10-CM

## 2021-08-31 DIAGNOSIS — I700 Atherosclerosis of aorta: Secondary | ICD-10-CM | POA: Diagnosis not present

## 2021-08-31 DIAGNOSIS — D509 Iron deficiency anemia, unspecified: Secondary | ICD-10-CM | POA: Diagnosis not present

## 2021-08-31 DIAGNOSIS — E039 Hypothyroidism, unspecified: Secondary | ICD-10-CM | POA: Diagnosis not present

## 2021-08-31 DIAGNOSIS — E119 Type 2 diabetes mellitus without complications: Secondary | ICD-10-CM | POA: Diagnosis not present

## 2021-08-31 DIAGNOSIS — N186 End stage renal disease: Secondary | ICD-10-CM | POA: Diagnosis not present

## 2021-08-31 DIAGNOSIS — R109 Unspecified abdominal pain: Secondary | ICD-10-CM | POA: Diagnosis not present

## 2021-08-31 DIAGNOSIS — D631 Anemia in chronic kidney disease: Secondary | ICD-10-CM | POA: Diagnosis not present

## 2021-08-31 DIAGNOSIS — E1129 Type 2 diabetes mellitus with other diabetic kidney complication: Secondary | ICD-10-CM | POA: Diagnosis not present

## 2021-08-31 DIAGNOSIS — N2581 Secondary hyperparathyroidism of renal origin: Secondary | ICD-10-CM | POA: Diagnosis not present

## 2021-08-31 DIAGNOSIS — Z992 Dependence on renal dialysis: Secondary | ICD-10-CM | POA: Diagnosis not present

## 2021-08-31 LAB — CBC WITH DIFFERENTIAL/PLATELET
Abs Immature Granulocytes: 0.02 10*3/uL (ref 0.00–0.07)
Basophils Absolute: 0 10*3/uL (ref 0.0–0.1)
Basophils Relative: 0 %
Eosinophils Absolute: 0.3 10*3/uL (ref 0.0–0.5)
Eosinophils Relative: 4 %
HCT: 32.7 % — ABNORMAL LOW (ref 36.0–46.0)
Hemoglobin: 10.8 g/dL — ABNORMAL LOW (ref 12.0–15.0)
Immature Granulocytes: 0 %
Lymphocytes Relative: 23 %
Lymphs Abs: 1.7 10*3/uL (ref 0.7–4.0)
MCH: 34.8 pg — ABNORMAL HIGH (ref 26.0–34.0)
MCHC: 33 g/dL (ref 30.0–36.0)
MCV: 105.5 fL — ABNORMAL HIGH (ref 80.0–100.0)
Monocytes Absolute: 1.1 10*3/uL — ABNORMAL HIGH (ref 0.1–1.0)
Monocytes Relative: 15 %
Neutro Abs: 4 10*3/uL (ref 1.7–7.7)
Neutrophils Relative %: 58 %
Platelets: 256 10*3/uL (ref 150–400)
RBC: 3.1 MIL/uL — ABNORMAL LOW (ref 3.87–5.11)
RDW: 14.8 % (ref 11.5–15.5)
WBC: 7.2 10*3/uL (ref 4.0–10.5)
nRBC: 0 % (ref 0.0–0.2)

## 2021-08-31 LAB — TYPE AND SCREEN
ABO/RH(D): B POS
Antibody Screen: NEGATIVE

## 2021-08-31 LAB — BASIC METABOLIC PANEL
Anion gap: 15 (ref 5–15)
BUN: 62 mg/dL — ABNORMAL HIGH (ref 6–20)
CO2: 23 mmol/L (ref 22–32)
Calcium: 9.2 mg/dL (ref 8.9–10.3)
Chloride: 98 mmol/L (ref 98–111)
Creatinine, Ser: 11.98 mg/dL — ABNORMAL HIGH (ref 0.44–1.00)
GFR, Estimated: 3 mL/min — ABNORMAL LOW (ref >60)
Glucose, Bld: 108 mg/dL — ABNORMAL HIGH (ref 70–99)
Potassium: 3.2 mmol/L — ABNORMAL LOW (ref 3.5–5.1)
Sodium: 136 mmol/L (ref 135–145)

## 2021-08-31 LAB — PROTIME-INR
INR: 1 (ref 0.8–1.2)
Prothrombin Time: 13 seconds (ref 11.4–15.2)

## 2021-08-31 MED ORDER — IOHEXOL 300 MG/ML  SOLN
100.0000 mL | Freq: Once | INTRAMUSCULAR | Status: AC | PRN
  Administered 2021-08-31: 100 mL via INTRAVENOUS

## 2021-08-31 MED ORDER — HYDROCORTISONE ACETATE 25 MG RE SUPP
25.0000 mg | RECTAL | Status: AC
  Administered 2021-08-31: 25 mg via RECTAL
  Filled 2021-08-31: qty 1

## 2021-08-31 MED ORDER — HYDROCORTISONE ACETATE 25 MG RE SUPP: 25.0000 mg | Freq: Two times a day (BID) | RECTAL | 0 refills | Status: DC

## 2021-08-31 NOTE — ED Provider Notes (Signed)
[facility] EMERGENCY DEPARTMENT Provider Note   CSN: [id number] Arrival date & time: 08/30/21  2352     History  Chief Complaint  Patient presents with   Rectal Bleeding    Megan [name] is a 61 y.o. female.  Patient presents to the emergency department for evaluation of rectal bleeding.  Patient first noticed blood in the toilet yesterday.  She has had multiple episodes of feeling like she needs to have a bowel movement but only passing blood since then.  She has not noticed any rectal pain.  No dizziness, passing out, chest pain, heart palpitations.      Home Medications Prior to Admission medications   Medication Sig Start Date End Date Taking? Authorizing Provider  hydrocortisone (ANUSOL-HC) 25 MG suppository Place 1 suppository (25 mg total) rectally 2 (two) times daily. For 7 days 08/31/21  Yes Ithzel Fedorchak, Gwenyth Allegra, MD  acetaminophen (TYLENOL) 500 MG tablet Take 500 mg by mouth every 8 (eight) hours as needed. 11/10/20 11/09/21  [provider]  amLODipine (NORVASC) 10 MG tablet TAKE 1 TABLET BY MOUTH AT BEDTIME 11/25/20   [name], MD  anastrozole (ARIMIDEX) 1 MG tablet TAKE 1 TABLET(1 MG) BY MOUTH DAILY 08/19/21   Derek Jack, MD  anastrozole (ARIMIDEX) 1 MG tablet TAKE 1 TABLET(1 MG) BY MOUTH DAILY 08/19/21   Derek Jack, MD  aspirin EC 81 MG tablet Take 81 mg by mouth daily.    [provider]  atorvastatin (LIPITOR) 20 MG tablet TAKE 1 TABLET DAILY (NEEDS OFFICE VISIT) 06/13/21   [name], MD  B Complex-C-Folic Acid (DIALYVITE 353) 0.8 MG TABS Take 1 tablet by mouth daily. 01/22/18   [provider]  cinacalcet (SENSIPAR) 30 MG tablet Take 2 tablets (60 mg total) by mouth daily. 06/13/18   [name], MD  Darbepoetin Alfa (ARANESP, ALBUMIN FREE, IJ) Take once a week with dialysis unsure of dose 07/01/21 06/30/22  [provider]  DULoxetine (CYMBALTA) 30 MG capsule TAKE 1 CAPSULE(30 MG) BY  MOUTH DAILY 08/19/21   [name], MD  gabapentin (NEURONTIN) 300 MG capsule TAKE 1 CAPSULE(300 MG) BY MOUTH AT BEDTIME 07/04/21   [name], MD  HYDROcodone-acetaminophen (NORCO/VICODIN) 5-325 MG tablet Take 1 tablet by mouth every 6 (six) hours as needed. 06/02/21   Evalee Jefferson, PA-C  lanthanum (FOSRENOL) 1000 MG chewable tablet Chew 2,000-3,000 mg by mouth See admin instructions. Take 3 tablets (3000 mg) by mouth with meals and take 2 tablets (2000 mg) with snacks 10/16/19   [provider]  lidocaine-prilocaine (EMLA) cream Apply 1 application topically every Monday, Wednesday, and Friday with hemodialysis. 09/24/18   [provider]  nebivolol (BYSTOLIC) 10 MG tablet TAKE 1 TABLET IN THE MORNING AND AT BEDTIME 04/21/21   [name], MD  pantoprazole (PROTONIX) 40 MG tablet TAKE 1 TABLET(40 MG) BY MOUTH DAILY 11/25/20   [name], MD      Allergies    Patient has no known allergies.    Review of Systems   Review of Systems  Gastrointestinal:  Positive for anal bleeding.   Physical Exam Updated Vital Signs BP 140/74    Pulse 73    Temp 98.7 F (37.1 C) (Oral)    Resp 14    Ht 5\' 2"  (1.575 m)    Wt 85.3 kg    SpO2 96%    BMI 34.39 kg/m  Physical Exam Vitals and nursing note reviewed.  Constitutional:  General: She is not in acute distress.    Appearance: Normal appearance. She is well-developed.  HENT:     Head: Normocephalic and atraumatic.     Right Ear: Hearing normal.     Left Ear: Hearing normal.     Nose: Nose normal.  Eyes:     Conjunctiva/sclera: Conjunctivae normal.     Pupils: Pupils are equal, round, and reactive to light.  Cardiovascular:     Rate and Rhythm: Regular rhythm.     Heart sounds: S1 normal and S2 normal. No murmur heard.   No friction rub. No gallop.  Pulmonary:     Effort: Pulmonary effort is normal. No respiratory distress.     Breath sounds: Normal breath sounds.  Chest:     Chest wall: No tenderness.   Abdominal:     General: Bowel sounds are normal.     Palpations: Abdomen is soft.     Tenderness: There is abdominal tenderness in the left lower quadrant. There is no guarding or rebound. Negative signs include Murphy's sign and McBurney's sign.     Hernia: No hernia is present.  Musculoskeletal:        General: Normal range of motion.     Cervical back: Normal range of motion and neck supple.  Skin:    General: Skin is warm and dry.     Findings: No rash.  Neurological:     Mental Status: She is alert and oriented to person, place, and time.     GCS: GCS eye subscore is 4. GCS verbal subscore is 5. GCS motor subscore is 6.     Cranial Nerves: No cranial nerve deficit.     Sensory: No sensory deficit.     Coordination: Coordination normal.  Psychiatric:        Speech: Speech normal.        Behavior: Behavior normal.        Thought Content: Thought content normal.    ED Results / Procedures / Treatments   Labs (all labs ordered are listed, but only abnormal results are displayed) Labs Reviewed  CBC WITH DIFFERENTIAL/PLATELET - Abnormal; Notable for the following components:      Result Value   RBC 3.10 (*)    Hemoglobin 10.8 (*)    HCT 32.7 (*)    MCV 105.5 (*)    MCH 34.8 (*)    Monocytes Absolute 1.1 (*)    All other components within normal limits  BASIC METABOLIC PANEL - Abnormal; Notable for the following components:   Potassium 3.2 (*)    Glucose, Bld 108 (*)    BUN 62 (*)    Creatinine, Ser 11.98 (*)    GFR, Estimated 3 (*)    All other components within normal limits  PROTIME-INR  TYPE AND SCREEN    EKG None  Radiology CT ABDOMEN PELVIS W CONTRAST  Result Date: 08/31/2021 CLINICAL DATA:  Left lower quadrant abdominal pain. EXAM: CT ABDOMEN AND PELVIS WITH CONTRAST TECHNIQUE: Multidetector CT imaging of the abdomen and pelvis was performed using the standard protocol following bolus administration of intravenous contrast. RADIATION DOSE REDUCTION: This  exam was performed according to the departmental dose-optimization program which includes automated exposure control, adjustment of the mA and/or kV according to patient size and/or use of iterative reconstruction technique. CONTRAST:  127mL OMNIPAQUE IOHEXOL 300 MG/ML  SOLN COMPARISON:  CT abdomen pelvis dated 06/02/2021. FINDINGS: Lower chest: The visualized lung bases are clear. Mild cardiomegaly. No intra-abdominal free air or  free fluid. Hepatobiliary: The liver is unremarkable. No intrahepatic biliary dilatation. Cholecystectomy. Pancreas: Unremarkable. No pancreatic ductal dilatation or surrounding inflammatory changes. Spleen: Normal in size without focal abnormality. Adrenals/Urinary Tract: The adrenal glands unremarkable. Atrophic kidney similar to prior CT. Nonobstructing bilateral renal calculi. There is no hydronephrosis on either side. There is a 1 cm exophytic lesion from the posterior interpolar right kidney similar to prior CT. This is not characterized on this CT. Additional small renal cysts noted. The visualized ureters are unremarkable. The urinary bladder is collapsed. Stomach/Bowel: There is no bowel obstruction or active inflammation. Appendectomy. Vascular/Lymphatic: Advanced aortoiliac atherosclerotic disease. The IVC is unremarkable. No portal venous gas. There is no adenopathy. Reproductive: Hysterectomy.  No adnexal masses. Other: None Musculoskeletal: Osteopenia.  No acute osseous pathology. IMPRESSION: 1. No acute intra-abdominal or pelvic pathology. 2. Atrophic kidneys with nonobstructing bilateral renal calculi. No hydronephrosis. 3. Bilateral renal lesions are not characterized on this CT but similar to prior CT. These were better evaluated on the MRI of 07/12/2021. 4. Aortic Atherosclerosis (ICD10-I70.0). Electronically Signed   By: Anner Crete M.D.   On: 08/31/2021 02:06    Procedures Procedures    Medications Ordered in ED Medications  iohexol (OMNIPAQUE) 300 MG/ML  solution 100 mL (100 mLs Intravenous Contrast Given 08/31/21 0136)    ED Course/ Medical Decision Making/ A&P                           Medical Decision Making Amount and/or Complexity of Data Reviewed Labs: ordered. Radiology: ordered.  Risk Prescription drug management.   Patient presents to the emergency department for evaluation of rectal bleeding.  Patient has been noticing bright red blood per rectum since yesterday.  Patient reports blood in the commode when she has an urge to have a bowel movement, even when no stool comes out.  Patient asymptomatic otherwise.  She is not experiencing any rectal pain but did have some very slight lower left abdominal tenderness on exam.  She is not hypotensive, dizzy, tachycardic or experiencing palpitations.  Hemoglobin is fairly stable for her at 10.8.  CT scan performed does not show any acute inflammatory infectious process.  Additional information provided by her husband.  Records were reviewed as well.  Patient does have a history of GI bleed last year.  At that time she had upper and lower endoscopy.  Patient had a nonbleeding polyp in her stomach that was removed, otherwise normal EGD.  She had large internal hemorrhoids, otherwise normal colonoscopy.  At this point, known internal hemorrhoids are the likely cause of bleeding.  She appears stable.  I do not feel that she requires hospitalization as she has normal vital signs and will not require transfusion.  Will discharge with Anusol HC and prompt follow-up with Dr. [name].        Final Clinical Impression(s) / ED Diagnoses Final diagnoses:  Rectal bleeding    Rx / DC Orders ED Discharge Orders          Ordered    hydrocortisone (ANUSOL-HC) 25 MG suppository  2 times daily        08/31/21 0234              Orpah Greek, MD 08/31/21 [phone]

## 2021-08-31 NOTE — ED Notes (Signed)
Patient transported to CT 

## 2021-08-31 NOTE — ED Triage Notes (Signed)
Pov from home with husband. Cc of bright red rectal bleeding. That she noticed yesterday. Denies any pain or struggling to use the restroom. Pt is MT[facility] HD. Fistula in the left arm. Did not miss her Monday visit.

## 2021-09-02 DIAGNOSIS — N2581 Secondary hyperparathyroidism of renal origin: Secondary | ICD-10-CM | POA: Diagnosis not present

## 2021-09-02 DIAGNOSIS — N186 End stage renal disease: Secondary | ICD-10-CM | POA: Diagnosis not present

## 2021-09-02 DIAGNOSIS — Z992 Dependence on renal dialysis: Secondary | ICD-10-CM | POA: Diagnosis not present

## 2021-09-02 DIAGNOSIS — D631 Anemia in chronic kidney disease: Secondary | ICD-10-CM | POA: Diagnosis not present

## 2021-09-02 DIAGNOSIS — E1129 Type 2 diabetes mellitus with other diabetic kidney complication: Secondary | ICD-10-CM | POA: Diagnosis not present

## 2021-09-02 DIAGNOSIS — D509 Iron deficiency anemia, unspecified: Secondary | ICD-10-CM | POA: Diagnosis not present

## 2021-09-05 DIAGNOSIS — D631 Anemia in chronic kidney disease: Secondary | ICD-10-CM | POA: Diagnosis not present

## 2021-09-05 DIAGNOSIS — D509 Iron deficiency anemia, unspecified: Secondary | ICD-10-CM | POA: Diagnosis not present

## 2021-09-05 DIAGNOSIS — N2581 Secondary hyperparathyroidism of renal origin: Secondary | ICD-10-CM | POA: Diagnosis not present

## 2021-09-05 DIAGNOSIS — N186 End stage renal disease: Secondary | ICD-10-CM | POA: Diagnosis not present

## 2021-09-05 DIAGNOSIS — Z992 Dependence on renal dialysis: Secondary | ICD-10-CM | POA: Diagnosis not present

## 2021-09-05 DIAGNOSIS — E1129 Type 2 diabetes mellitus with other diabetic kidney complication: Secondary | ICD-10-CM | POA: Diagnosis not present

## 2021-09-06 ENCOUNTER — Encounter (HOSPITAL_COMMUNITY)
Admission: RE | Admit: 2021-09-06 | Discharge: 2021-09-06 | Disposition: A | Discharge status: Home / Self Care | Payer: Medicare Other | Source: Ambulatory Visit | Attending: Hematology | Admitting: Hematology

## 2021-09-06 ENCOUNTER — Other Ambulatory Visit: Payer: Self-pay

## 2021-09-06 DIAGNOSIS — C50211 Malignant neoplasm of upper-inner quadrant of right female breast: Secondary | ICD-10-CM | POA: Insufficient documentation

## 2021-09-06 DIAGNOSIS — Z853 Personal history of malignant neoplasm of breast: Secondary | ICD-10-CM | POA: Diagnosis not present

## 2021-09-06 DIAGNOSIS — M545 Low back pain, unspecified: Secondary | ICD-10-CM | POA: Diagnosis not present

## 2021-09-06 DIAGNOSIS — M170 Bilateral primary osteoarthritis of knee: Secondary | ICD-10-CM | POA: Diagnosis not present

## 2021-09-06 DIAGNOSIS — Z170 Estrogen receptor positive status [ER+]: Secondary | ICD-10-CM | POA: Diagnosis not present

## 2021-09-06 MED ORDER — TECHNETIUM TC 99M MEDRONATE IV KIT
20.0000 | PACK | Freq: Once | INTRAVENOUS | Status: AC | PRN
  Administered 2021-09-06: 09:00:00 22 via INTRAVENOUS

## 2021-09-07 DIAGNOSIS — Z992 Dependence on renal dialysis: Secondary | ICD-10-CM | POA: Diagnosis not present

## 2021-09-07 DIAGNOSIS — D509 Iron deficiency anemia, unspecified: Secondary | ICD-10-CM | POA: Diagnosis not present

## 2021-09-07 DIAGNOSIS — N2581 Secondary hyperparathyroidism of renal origin: Secondary | ICD-10-CM | POA: Diagnosis not present

## 2021-09-07 DIAGNOSIS — D631 Anemia in chronic kidney disease: Secondary | ICD-10-CM | POA: Diagnosis not present

## 2021-09-07 DIAGNOSIS — N186 End stage renal disease: Secondary | ICD-10-CM | POA: Diagnosis not present

## 2021-09-07 DIAGNOSIS — E1129 Type 2 diabetes mellitus with other diabetic kidney complication: Secondary | ICD-10-CM | POA: Diagnosis not present

## 2021-09-08 ENCOUNTER — Inpatient Hospital Stay (HOSPITAL_BASED_OUTPATIENT_CLINIC_OR_DEPARTMENT_OTHER): Payer: Medicare Other | Admitting: Hematology

## 2021-09-08 ENCOUNTER — Other Ambulatory Visit: Payer: Self-pay

## 2021-09-08 VITALS — BP 140/77 | HR 78 | Temp 96.9°F | Resp 18 | Ht 61.42 in | Wt 180.8 lb

## 2021-09-08 DIAGNOSIS — C50211 Malignant neoplasm of upper-inner quadrant of right female breast: Secondary | ICD-10-CM | POA: Diagnosis not present

## 2021-09-08 DIAGNOSIS — Z170 Estrogen receptor positive status [ER+]: Secondary | ICD-10-CM | POA: Diagnosis not present

## 2021-09-08 DIAGNOSIS — N186 End stage renal disease: Secondary | ICD-10-CM | POA: Diagnosis not present

## 2021-09-08 DIAGNOSIS — Z992 Dependence on renal dialysis: Secondary | ICD-10-CM | POA: Diagnosis not present

## 2021-09-08 DIAGNOSIS — G629 Polyneuropathy, unspecified: Secondary | ICD-10-CM | POA: Diagnosis not present

## 2021-09-08 DIAGNOSIS — E559 Vitamin D deficiency, unspecified: Secondary | ICD-10-CM

## 2021-09-08 DIAGNOSIS — M8580 Other specified disorders of bone density and structure, unspecified site: Secondary | ICD-10-CM | POA: Diagnosis not present

## 2021-09-08 NOTE — Progress Notes (Signed)
Megan [name] [address] Megan [name] [name]   Patient Care Team: [name], MD as PCP - General (Family Medicine) [name], [name], MD as PCP - Cardiology (Cardiology) [name] [name], MD (Gastroenterology) [name], MD as Consulting Physician (Cardiology) [name], RN as Oncology Nurse Navigator (Oncology)  SUMMARY OF ONCOLOGIC HISTORY: Oncology History  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[place])  09/18/2018 Initial Diagnosis   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[place])   09/26/2018 Cancer Staging   Staging form: Breast, AJCC 8th Edition - Clinical stage from 09/26/2018: Stage IB (cT1c, cN0, cM0, G3, ER+, PR-, HER2-) - Signed by Derek Jack, MD on 09/26/2018    10/30/2018 -  Chemotherapy   The patient had DOXOrubicin (ADRIAMYCIN) chemo injection 108 mg, 60 mg/m2 = 108 mg, Intravenous,  Once, 4 of 4 cycles Administration: 108 mg (10/30/2018), 108 mg (11/20/2018), 108 mg (12/11/2018), 108 mg (01/01/2019) palonosetron (ALOXI) injection 0.25 mg, 0.25 mg, Intravenous,  Once, 4 of 4 cycles Administration: 0.25 mg (10/30/2018), 0.25 mg (11/20/2018), 0.25 mg (12/11/2018), 0.25 mg (01/01/2019) pegfilgrastim (NEULASTA ONPRO KIT) injection 6 mg, 6 mg, Subcutaneous, Once, 4 of 4 cycles Administration: 6 mg (10/30/2018), 6 mg (11/20/2018), 6 mg (12/11/2018), 6 mg (01/01/2019) cyclophosphamide (CYTOXAN) 820 mg in sodium chloride 0.9 % 250 mL chemo infusion, 450 mg/m2 = 820 mg (75 % of original dose 600 mg/m2), Intravenous,  Once, 4 of 4 cycles Dose modification: 450 mg/m2 (75 % of original dose 600 mg/m2, Cycle 1, Reason: Other (see comments), Comment: hemodialysis) Administration: 820 mg (10/30/2018), 820 mg (11/20/2018), 820 mg (12/11/2018), 820 mg (01/01/2019) fosaprepitant (EMEND) 150 mg, dexamethasone (DECADRON) 12 mg in sodium chloride 0.9 % 145 mL IVPB, , Intravenous,  Once, 4 of 4  cycles Administration:  (10/30/2018),  (11/20/2018),  (12/11/2018),  (01/01/2019)   for chemotherapy treatment.       CHIEF COMPLIANT: Follow-up of right breast cancer   INTERVAL HISTORY: Megan [name] [name] is a 61 y.o. female here today for follow up of her right breast cancer. Her last visit was on 08/30/2021.   Today she reports feeling good. She continues to have sharp right breast pain which is now occurring less frequently.   REVIEW OF SYSTEMS:   Review of Systems  Constitutional:  Negative for appetite change and fatigue.  Respiratory:  Positive for cough (dry).   All other systems reviewed and are negative.  I have reviewed the past medical history, past surgical history, social history and family history with the patient and they are unchanged from previous note.   ALLERGIES:   has No Known Allergies.   MEDICATIONS:  Current Outpatient Medications  Medication Sig Dispense Refill   acetaminophen (TYLENOL) 500 MG tablet Take 500 mg by mouth every 8 (eight) hours as needed.     amLODipine (NORVASC) 10 MG tablet TAKE 1 TABLET BY MOUTH AT BEDTIME 90 tablet 3   anastrozole (ARIMIDEX) 1 MG tablet TAKE 1 TABLET(1 MG) BY MOUTH DAILY 30 tablet 4   anastrozole (ARIMIDEX) 1 MG tablet TAKE 1 TABLET(1 MG) BY MOUTH DAILY 30 tablet 4   aspirin EC 81 MG tablet Take 81 mg by mouth daily.     atorvastatin (LIPITOR) 20 MG tablet TAKE 1 TABLET DAILY (NEEDS OFFICE VISIT) 90 tablet 3   B Complex-C-Folic Acid (DIALYVITE 956) 0.8 MG TABS Take 1 tablet by mouth daily.     cinacalcet (SENSIPAR) 30 MG  tablet Take 2 tablets (60 mg total) by mouth daily. 90 tablet 0   Darbepoetin Alfa (ARANESP, ALBUMIN FREE, IJ) Take once a week with dialysis unsure of dose     DULoxetine (CYMBALTA) 30 MG capsule TAKE 1 CAPSULE(30 MG) BY MOUTH DAILY 90 capsule 1   gabapentin (NEURONTIN) 300 MG capsule TAKE 1 CAPSULE(300 MG) BY MOUTH AT BEDTIME 90 capsule 1   HYDROcodone-acetaminophen (NORCO/VICODIN) 5-325 MG  tablet Take 1 tablet by mouth every 6 (six) hours as needed. 20 tablet 0   hydrocortisone (ANUSOL-HC) 25 MG suppository Place 1 suppository (25 mg total) rectally 2 (two) times daily. For 7 days 14 suppository 0   iron sucrose in sodium chloride 0.9 % 100 mL During dialysis, every treatment     lanthanum (FOSRENOL) 1000 MG chewable tablet Chew 2,000-3,000 mg by mouth See admin instructions. Take 3 tablets (3000 mg) by mouth with meals and take 2 tablets (2000 mg) with snacks     nebivolol (BYSTOLIC) 10 MG tablet TAKE 1 TABLET IN THE MORNING AND AT BEDTIME 180 tablet 3   pantoprazole (PROTONIX) 40 MG tablet TAKE 1 TABLET(40 MG) BY MOUTH DAILY 90 tablet 3   VITAMIN D PO Take by mouth.     lidocaine-prilocaine (EMLA) cream Apply 1 application topically every Monday, Wednesday, and Friday with hemodialysis. (Patient not taking: Reported on 09/08/2021)     No current facility-administered medications for this visit.     PHYSICAL EXAMINATION: Performance status (ECOG): 1 - Symptomatic but completely ambulatory  Vitals:   09/08/21 1023  BP: 140/77  Pulse: 78  Resp: 18  Temp: (!) 96.9 F (36.1 C)  SpO2: 100%   Wt Readings from Last 3 Encounters:  09/08/21 180 lb 12.4 oz (82 kg)  08/31/21 188 lb 0.2 oz (85.3 kg)  08/30/21 181 lb 6.4 oz (82.3 kg)   Physical Exam Vitals reviewed.  Constitutional:      Appearance: Normal appearance. She is obese.  Cardiovascular:     Rate and Rhythm: Normal rate and regular rhythm.     Pulses: Normal pulses.     Heart sounds: Normal heart sounds.  Pulmonary:     Effort: Pulmonary effort is normal.     Breath sounds: Normal breath sounds.  Neurological:     General: No focal deficit present.     Mental Status: She is alert and oriented to person, place, and time.  Psychiatric:        Mood and Affect: Mood normal.        Behavior: Behavior normal.    Breast Exam Chaperone: Thana Ates     LABORATORY DATA:  I have reviewed the data as  listed CMP Latest Ref Rng & Units 08/31/2021 08/26/2021 08/23/2021  Glucose 70 - 99 mg/dL 108(H) 157(H) 114(H)  BUN 6 - 20 mg/dL 62(H) 22 52(H)  Creatinine 0.44 - 1.00 mg/dL 11.98(H) 6.69(H) 10.79(H)  Sodium 135 - 145 mmol/L 136 140 141  Potassium 3.5 - 5.1 mmol/L 3.2(L) 3.4(L) 3.5  Chloride 98 - 111 mmol/L 98 95(L) 99  CO2 22 - 32 mmol/L 23 30 28   Calcium 8.9 - 10.3 mg/dL 9.2 9.8 9.3  Total Protein 6.5 - 8.1 g/dL - - 7.7  Total Bilirubin 0.3 - 1.2 mg/dL - - 0.6  Alkaline Phos 38 - 126 U/L - - 110  AST 15 - 41 U/L - - 21  ALT 0 - 44 U/L - - 9   Lab Results  Component Value Date   CAN153 28.0 (  H) 08/23/2021   Lab Results  Component Value Date   WBC 7.2 08/31/2021   HGB 10.8 (L) 08/31/2021   HCT 32.7 (L) 08/31/2021   MCV 105.5 (H) 08/31/2021   PLT 256 08/31/2021   NEUTROABS 4.0 08/31/2021    ASSESSMENT:  1.  Stage Ib (T1CN0) right breast cancer, ER positive, PR and HER-2 negative: -Right lumpectomy and sentinel lymph node biopsy on 09/18/2018 shows 1.5 cm IDC, grade 3, associated with high-grade DCIS, negative margins, 0/3 lymph nodes positive, ER 50%, PR negative and HER-2 negative, Ki-67 60%. -Oncotype DX recurrence score 47.  Distant recurrence at 9 years with tamoxifen alone is 36%.  Absolute chemotherapy benefit is more than 15%. -Adjuvant chemotherapy with 4 cycles of AC from 10/30/2018 through 01/01/2019. -She was started on anastrozole in June 2020. -Mammogram on 09/02/2019 showed calcifications in the posterior aspect of upper outer quadrant of the right breast. -Right mastectomy on 02/18/2020 shows high-grade DCIS, 1.5 cm, no invasive carcinoma, resection margins negative.  0/11 lymph nodes.  ER 30% positive, PR negative. -Left simple mastectomy on 06/15/2020 was benign.  Right breast biopsy shows microscopic focus of invasive ductal carcinoma in the background of extensive high-grade DCIS. -Right breast lumpectomy on 07/13/2020 shows multifocal invasive ductal carcinoma, grade 3,  largest measuring 1.2 cm.  Extensive high-grade DCIS with necrosis.  Margins are free.  PT1CNX.  There are multiple foci of invasive carcinoma arising from extensive DCIS.  Several small foci of invasive tumor arising from DCIS indicating new primaries.  ER/PR/HER-2 is negative.  Ki-67 is 20%. -PET scan on 09/14/2020 shows areas of nodularity with discrete nodule in the fat of the inferior chest wall within the subcutaneous tissues. -Ultrasound of the right chest wall shows masslike abnormality in the upper inner aspect of the right anterior chest at 1 o'clock position measuring 4.1 cm in greatest dimension.  1.7 cm hypoechoic mass in the central anterior right chest concerning for malignancy.  Possible borderline enlarged residual right axillary lymph node. -Right breast biopsy on 10/12/2020 shows 1.3 cm invasive ductal carcinoma, focally 0.1 cm from superior margin.  DCIS is less than 0.1 cm from superior margin.  PT1CPNX. - Right mastectomy followed by radiation therapy completed in May 2022.   2.  Osteopenia: -Bone density on 01/20/2019 shows T score -1.9.   PLAN:  1.  Recurrent right breast cancer, triple receptor negative: - She is tolerating anastrozole very well.  This is being continued as her initial cancer was ER positive. - She reported sharp pains of the right mastectomy site at last visit.  We have ordered bone scan. - Today she reports that frequency of the sharp pains have decreased to half.  She also has some tenderness at the right lateral ribs without any palpable masses. - Labs from 08/23/2021 showed CA 15-3 slightly elevated at 28.  No baseline prior values. - Reviewed bone scan images with the patient and her husband dated 09/07/2021.  No evidence of metastatic disease. - I have also reviewed CTAP from 08/31/2021 which did not show any evidence of metastatic disease. - Recommend follow-up in 4 months with repeat labs and tumor markers.    2.  Osteopenia: - DEXA scan on 03/24/2021  with T score -2.4. - She is receiving vitamin D at the time of dialysis. - Vitamin D was low at 22 on 08/23/2021.  Reportedly dose was increased to dialysis center.   3.  ESRD on HD: - Continue HD on Monday/Wednesday/Friday under the direction of  Dr. Marval Regal.   4.  Neuropathy: - Continue gabapentin at bedtime for burning sensation in the bottom of the feet.  Breast Cancer therapy associated bone loss: I have recommended calcium, Vitamin D and weight bearing exercises.  Orders placed this encounter:  No orders of the defined types were placed in this encounter.   The patient has a good understanding of the overall plan. She agrees with it. She will call with any problems that may develop before the next visit here.  Derek Jack, MD [place] [phone]   I, Thana Ates, am acting as a scribe for Dr. Derek Jack.  I, Derek Jack MD, have reviewed the above documentation for accuracy and completeness, and I agree with the above.

## 2021-09-08 NOTE — Progress Notes (Signed)
Patient is taking Anastrozole as prescribed.  She has not missed any doses and reports no side effects at this time.   

## 2021-09-08 NOTE — Patient Instructions (Signed)
[place] at [facility] Discharge Instructions   You were seen and examined today by Dr. Delton Coombes.  He reviewed the results of your scan, which is normal - no evidence of cancer.  Return as scheduled in 4 months.    Thank you for choosing [place] at [facility] to provide your oncology and hematology care.  To afford each patient quality time with our provider, please arrive at least 15 minutes before your scheduled appointment time.   If you have a lab appointment with the [facility] please come in thru the Main Entrance and check in at the main information desk.  You need to re-schedule your appointment should you arrive 10 or more minutes late.  We strive to give you quality time with our providers, and arriving late affects you and other patients whose appointments are after yours.  Also, if you no show three or more times for appointments you may be dismissed from the clinic at the providers discretion.     Again, thank you for choosing [facility].  Our hope is that these requests will decrease the amount of time that you wait before being seen by our physicians.       _____________________________________________________________  Should you have questions after your visit to [facility], please contact our office at [phone] and follow the prompts.  Our office hours are 8:00 a.m. and 4:30 p.m. Monday - Friday.  Please note that voicemails left after 4:00 p.m. may not be returned until the following business day.  We are closed weekends and major holidays.  You do have access to a nurse 24-7, just call the main number to the clinic [phone] and do not press any options, hold on the line and a nurse will answer the phone.    For prescription refill requests, have your pharmacy contact our office and allow 72 hours.    Due to Covid, you will need to wear a mask upon entering the hospital. If you do  not have a mask, a mask will be given to you at the Main Entrance upon arrival. For doctor visits, patients may have 1 support person age 75 or older with them. For treatment visits, patients can not have anyone with them due to social distancing guidelines and our immunocompromised population.

## 2021-09-09 ENCOUNTER — Ambulatory Visit (INDEPENDENT_AMBULATORY_CARE_PROVIDER_SITE_OTHER): Payer: Medicare Other

## 2021-09-09 VITALS — Ht 61.0 in | Wt 181.0 lb

## 2021-09-09 DIAGNOSIS — N186 End stage renal disease: Secondary | ICD-10-CM | POA: Diagnosis not present

## 2021-09-09 DIAGNOSIS — Z992 Dependence on renal dialysis: Secondary | ICD-10-CM | POA: Diagnosis not present

## 2021-09-09 DIAGNOSIS — D631 Anemia in chronic kidney disease: Secondary | ICD-10-CM | POA: Diagnosis not present

## 2021-09-09 DIAGNOSIS — Z0000 Encounter for general adult medical examination without abnormal findings: Secondary | ICD-10-CM | POA: Diagnosis not present

## 2021-09-09 DIAGNOSIS — D509 Iron deficiency anemia, unspecified: Secondary | ICD-10-CM | POA: Diagnosis not present

## 2021-09-09 DIAGNOSIS — E1129 Type 2 diabetes mellitus with other diabetic kidney complication: Secondary | ICD-10-CM | POA: Diagnosis not present

## 2021-09-09 DIAGNOSIS — Z23 Encounter for immunization: Secondary | ICD-10-CM | POA: Diagnosis not present

## 2021-09-09 DIAGNOSIS — N2581 Secondary hyperparathyroidism of renal origin: Secondary | ICD-10-CM | POA: Diagnosis not present

## 2021-09-09 NOTE — Patient Instructions (Signed)
Ms. Cl[place] , Thank you for taking time to come for your Medicare Wellness Visit. I appreciate your ongoing commitment to your health goals. Please review the following plan we discussed and let me know if I can assist you in the future.   Screening recommendations/referrals: Colonoscopy: Done 06/16/2021 Repeat in 10 years  Mammogram: Done 09/12/2019. Repeat as ordered per Oncology. Bone Density: Done 03/24/2021 Repeat every 2 years  Recommended yearly ophthalmology/optometry visit for glaucoma screening and checkup Recommended yearly dental visit for hygiene and checkup  Vaccinations: Influenza vaccine: Done 05/06/2021. Repeat annually  Pneumococcal vaccine: Done 02/07/2019 and 10/01/2020. Tdap vaccine: Done 07/20/2005 Repeat in 10 years  Shingles vaccine: Shingrix discussed. Please contact your pharmacy for coverage information.    Covid-19: Done 11/11/2019, 12/10/2019, 07/02/2020 and 09/09/2021.  Advanced directives: Please bring a copy of your health care power of attorney and living will to the office to be added to your chart at your convenience.   Conditions/risks identified: Aim for 30 minutes of exercise or brisk walking each day, drink 6-8 glasses of water and eat lots of fruits and vegetables.   Next appointment: Follow up in one year for your annual wellness visit. 2024.  Preventive Care 40-64 Years, Female Preventive care refers to lifestyle choices and visits with your health care provider that can promote health and wellness. What does preventive care include? A yearly physical exam. This is also called an annual well check. Dental exams once or twice a year. Routine eye exams. Ask your health care provider how often you should have your eyes checked. Personal lifestyle choices, including: Daily care of your teeth and gums. Regular physical activity. Eating a healthy diet. Avoiding tobacco and drug use. Limiting alcohol use. Practicing safe sex. Taking low-dose  aspirin daily starting at age 43. Taking vitamin and mineral supplements as recommended by your health care provider. What happens during an annual well check? The services and screenings done by your health care provider during your annual well check will depend on your age, overall health, lifestyle risk factors, and family history of disease. Counseling  Your health care provider may ask you questions about your: Alcohol use. Tobacco use. Drug use. Emotional well-being. Home and relationship well-being. Sexual activity. Eating habits. Work and work Statistician. Method of birth control. Menstrual cycle. Pregnancy history. Screening  You may have the following tests or measurements: Height, weight, and BMI. Blood pressure. Lipid and cholesterol levels. These may be checked every 5 years, or more frequently if you are over 58 years old. Skin check. Lung cancer screening. You may have this screening every year starting at age 53 if you have a 30-pack-year history of smoking and currently smoke or have quit within the past 15 years. Fecal occult blood test (FOBT) of the stool. You may have this test every year starting at age 57. Flexible sigmoidoscopy or colonoscopy. You may have a sigmoidoscopy every 5 years or a colonoscopy every 10 years starting at age 50. Hepatitis C blood test. Hepatitis B blood test. Sexually transmitted disease (STD) testing. Diabetes screening. This is done by checking your blood sugar (glucose) after you have not eaten for a while (fasting). You may have this done every 1-3 years. Mammogram. This may be done every 1-2 years. Talk to your health care provider about when you should start having regular mammograms. This may depend on whether you have a family history of breast cancer. BRCA-related cancer screening. This may be done if you have a family history of  breast, ovarian, tubal, or peritoneal cancers. Pelvic exam and Pap test. This may be done every 3  years starting at age 49. Starting at age 82, this may be done every 5 years if you have a Pap test in combination with an HPV test. Bone density scan. This is done to screen for osteoporosis. You may have this scan if you are at high risk for osteoporosis. Discuss your test results, treatment options, and if necessary, the need for more tests with your health care provider. Vaccines  Your health care provider may recommend certain vaccines, such as: Influenza vaccine. This is recommended every year. Tetanus, diphtheria, and acellular pertussis (Tdap, Td) vaccine. You may need a Td booster every 10 years. Zoster vaccine. You may need this after age 32. Pneumococcal 13-valent conjugate (PCV13) vaccine. You may need this if you have certain conditions and were not previously vaccinated. Pneumococcal polysaccharide (PPSV23) vaccine. You may need one or two doses if you smoke cigarettes or if you have certain conditions. Talk to your health care provider about which screenings and vaccines you need and how often you need them. This information is not intended to replace advice given to you by your health care provider. Make sure you discuss any questions you have with your health care provider. Document Released: 08/27/2015 Document Revised: 04/19/2016 Document Reviewed: 06/01/2015 Elsevier Interactive Patient Education  2017 [place] Prevention in the Home Falls can cause injuries. They can happen to people of all ages. There are many things you can do to make your home safe and to help prevent falls. What can I do on the outside of my home? Regularly fix the edges of walkways and driveways and fix any cracks. Remove anything that might make you trip as you walk through a door, such as a raised step or threshold. Trim any bushes or trees on the path to your home. Use bright outdoor lighting. Clear any walking paths of anything that might make someone trip, such as rocks or  tools. Regularly check to see if handrails are loose or broken. Make sure that both sides of any steps have handrails. Any raised decks and porches should have guardrails on the edges. Have any leaves, snow, or ice cleared regularly. Use sand or salt on walking paths during winter. Clean up any spills in your garage right away. This includes oil or grease spills. What can I do in the bathroom? Use night lights. Install grab bars by the toilet and in the tub and shower. Do not use towel bars as grab bars. Use non-skid mats or decals in the tub or shower. If you need to sit down in the shower, use a plastic, non-slip stool. Keep the floor dry. Clean up any water that spills on the floor as soon as it happens. Remove soap buildup in the tub or shower regularly. Attach bath mats securely with double-sided non-slip rug tape. Do not have throw rugs and other things on the floor that can make you trip. What can I do in the bedroom? Use night lights. Make sure that you have a light by your bed that is easy to reach. Do not use any sheets or blankets that are too big for your bed. They should not hang down onto the floor. Have a firm chair that has side arms. You can use this for support while you get dressed. Do not have throw rugs and other things on the floor that can make you trip. What  can I do in the kitchen? Clean up any spills right away. Avoid walking on wet floors. Keep items that you use a lot in easy-to-reach places. If you need to reach something above you, use a strong step stool that has a grab bar. Keep electrical cords out of the way. Do not use floor polish or wax that makes floors slippery. If you must use wax, use non-skid floor wax. Do not have throw rugs and other things on the floor that can make you trip. What can I do with my stairs? Do not leave any items on the stairs. Make sure that there are handrails on both sides of the stairs and use them. Fix handrails that are  broken or loose. Make sure that handrails are as long as the stairways. Check any carpeting to make sure that it is firmly attached to the stairs. Fix any carpet that is loose or worn. Avoid having throw rugs at the top or bottom of the stairs. If you do have throw rugs, attach them to the floor with carpet tape. Make sure that you have a light switch at the top of the stairs and the bottom of the stairs. If you do not have them, ask someone to add them for you. What else can I do to help prevent falls? Wear shoes that: Do not have high heels. Have rubber bottoms. Are comfortable and fit you well. Are closed at the toe. Do not wear sandals. If you use a stepladder: Make sure that it is fully opened. Do not climb a closed stepladder. Make sure that both sides of the stepladder are locked into place. Ask someone to hold it for you, if possible. Clearly mark and make sure that you can see: Any grab bars or handrails. First and last steps. Where the edge of each step is. Use tools that help you move around (mobility aids) if they are needed. These include: Canes. Walkers. Scooters. Crutches. Turn on the lights when you go into a dark area. Replace any light bulbs as soon as they burn out. Set up your furniture so you have a clear path. Avoid moving your furniture around. If any of your floors are uneven, fix them. If there are any pets around you, be aware of where they are. Review your medicines with your doctor. Some medicines can make you feel dizzy. This can increase your chance of falling. Ask your doctor what other things that you can do to help prevent falls. This information is not intended to replace advice given to you by your health care provider. Make sure you discuss any questions you have with your health care provider. Document Released: 05/27/2009 Document Revised: 01/06/2016 Document Reviewed: 09/04/2014 Elsevier Interactive Patient Education  2017 [employer].

## 2021-09-09 NOTE — Progress Notes (Signed)
Subjective:   Megan [name] is a 61 y.o. female who presents for an Initial Medicare Annual Wellness Visit. Virtual Visit via Telephone Note  I connected with  Megan [name] on 09/09/21 at 12:00 PM EST by telephone and verified that I am speaking with the correct person using two identifiers.  Location: Patient: HOME  Provider: [facility] Persons participating in the virtual visit: patient/Nurse Health Advisor   I discussed the limitations, risks, security and privacy concerns of performing an evaluation and management service by telephone and the availability of in person appointments. The patient expressed understanding and agreed to proceed.  Interactive audio and video telecommunications were attempted between this nurse and patient, however failed, due to patient having technical difficulties OR patient did not have access to video capability.  We continued and completed visit with audio only.  Some vital signs may be absent or patient reported.   Megan Driver, LPN  Review of Systems     Cardiac Risk Factors include: advanced age (>50men, >75 women);hypertension;sedentary lifestyle;obesity (BMI >30kg/m2);Other (see comment), Risk factor comments: End stage renal disease, currently on dialysis.  PHONE VISIT. PT AT HOME. NURSE AT [facility].    Objective:    Today's Vitals   09/09/21 1154 09/09/21 1155  Weight: 181 lb (82.1 kg)   Height: 5\' 1"  (1.549 m)   PainSc:  0-No pain   Body mass index is 34.2 kg/m.  Advanced Directives 09/09/2021 09/08/2021 08/31/2021 08/30/2021 07/10/2021 06/13/2021 06/02/2021  Does Patient Have a Medical Advance Directive? Yes Yes Yes Yes Yes Yes Yes  Type of Paramedic of [place];Living will [place];Out of facility DNR (pink MOST or yellow form) [place];Out of facility DNR (pink MOST or yellow form) [place];Living will [place];Living  will [place];Living will [place];Living will  Does patient want to make changes to medical advance directive? - No - Patient declined - No - Patient declined No - Patient declined No - Patient declined -  Copy of [place] in Chart? No - copy requested No - copy requested No - copy requested No - copy requested No - copy requested No - copy requested -  Would patient like information on creating a medical advance directive? No - Patient declined No - Patient declined - No - Patient declined - No - Patient declined -  Pre-existing out of facility DNR order (yellow form or pink MOST form) - - - - - - -    Current Medications (verified) Outpatient Encounter Medications as of 09/09/2021  Medication Sig   acetaminophen (TYLENOL) 500 MG tablet Take 500 mg by mouth every 8 (eight) hours as needed.   amLODipine (NORVASC) 10 MG tablet TAKE 1 TABLET BY MOUTH AT BEDTIME   anastrozole (ARIMIDEX) 1 MG tablet TAKE 1 TABLET(1 MG) BY MOUTH DAILY   anastrozole (ARIMIDEX) 1 MG tablet TAKE 1 TABLET(1 MG) BY MOUTH DAILY   aspirin EC 81 MG tablet Take 81 mg by mouth daily.   atorvastatin (LIPITOR) 20 MG tablet TAKE 1 TABLET DAILY (NEEDS OFFICE VISIT)   B Complex-C-Folic Acid (DIALYVITE 333) 0.8 MG TABS Take 1 tablet by mouth daily.   cinacalcet (SENSIPAR) 30 MG tablet Take 2 tablets (60 mg total) by mouth daily.   Darbepoetin Alfa (ARANESP, ALBUMIN FREE, IJ) Take once a week with dialysis unsure of dose   DULoxetine (CYMBALTA) 30 MG capsule TAKE 1 CAPSULE(30  MG) BY MOUTH DAILY   gabapentin (NEURONTIN) 300 MG capsule TAKE 1 CAPSULE(300 MG) BY MOUTH AT BEDTIME   HYDROcodone-acetaminophen (NORCO/VICODIN) 5-325 MG tablet Take 1 tablet by mouth every 6 (six) hours as needed.   hydrocortisone (ANUSOL-HC) 25 MG suppository Place 1 suppository (25 mg total) rectally 2 (two) times daily. For 7 days   iron sucrose in sodium chloride 0.9 % 100 mL During dialysis,  every treatment   lanthanum (FOSRENOL) 1000 MG chewable tablet Chew 2,000-3,000 mg by mouth See admin instructions. Take 3 tablets (3000 mg) by mouth with meals and take 2 tablets (2000 mg) with snacks   lidocaine-prilocaine (EMLA) cream Apply 1 application topically every Monday, Wednesday, and Friday with hemodialysis.   nebivolol (BYSTOLIC) 10 MG tablet TAKE 1 TABLET IN THE MORNING AND AT BEDTIME   pantoprazole (PROTONIX) 40 MG tablet TAKE 1 TABLET(40 MG) BY MOUTH DAILY   VITAMIN D PO Take by mouth.   No facility-administered encounter medications on file as of 09/09/2021.    Allergies (verified) Patient has no known allergies.   History: Past Medical History:  Diagnosis Date   (HFpEF) heart failure with preserved ejection fraction ([place])    a. 01/2019 Echo: EF 55-60%, mild conc LVH. DD.  Torn MV chordae.   Anemia    Atypical chest pain    a. 08/2018 MV: EF 59%, no ischemia; b. 02/2019 Cath: nonobs dzs.   Blood transfusion without reported diagnosis    Breast cancer (Start) 10/12/2020   Cataract    ESRD (end stage renal disease) on dialysis [facility])    a. HD T, T, S   Essential hypertension, benign    GERD (gastroesophageal reflux disease)    Headache    Hemorrhoids    Mixed hyperlipidemia    Morbid obesity (HCC)    Non-obstructive CAD (coronary artery disease)    a. 02/2019 CathL LM nl, LAD 29m, LCX nl, RCA 25p, EF 55-65%.   PONV (postoperative nausea and vomiting)    S/P colonoscopy Jan 2011   Dr. Benson Norway: sessile polyp (benign lymphoid), large hemorrhoids, repeat 5-10 years   Temporal arteritis ([place])    Type 2 diabetes mellitus ([place])    Wears glasses    Past Surgical History:  Procedure Laterality Date   ABDOMINAL HYSTERECTOMY     APPENDECTOMY     ARTERY BIOPSY N/A 05/09/2018   Procedure: RIGHT TEMPORAL ARTERY BIOPSY;  Surgeon: Judeth Horn, MD;  Location: [place];  Service: General;  Laterality: N/A;   BREAST BIOPSY Right 06/15/2020   Procedure: RIGHT BREAST BIOPSY;  Surgeon:  Aviva Signs, MD;  Location: [facility];  Service: General;  Laterality: Right;   CATARACT EXTRACTION W/PHACO Left 02/09/2017   Procedure: CATARACT EXTRACTION PHACO AND INTRAOCULAR LENS PLACEMENT LEFT EYE;  Surgeon: Tonny Branch, MD;  Location: [facility];  Service: Ophthalmology;  Laterality: Left;  CDE: 4.89   CATARACT EXTRACTION W/PHACO Right 06/04/2017   Procedure: CATARACT EXTRACTION PHACO AND INTRAOCULAR LENS PLACEMENT (IOC);  Surgeon: Tonny Branch, MD;  Location: [facility];  Service: Ophthalmology;  Laterality: Right;  CDE: 4.12   CHOLECYSTECTOMY  09/29/2011   Procedure: LAPAROSCOPIC CHOLECYSTECTOMY;  Surgeon: Jamesetta So, MD;  Location: [facility];  Service: General;  Laterality: N/A;   COLONOSCOPY  08/2009   Dr. Benson Norway: sessile polyp (benign lymphoid), large hemorrhoids, repeat 5-10 years   COLONOSCOPY N/A 06/12/2016   prominent hemorrhoids   COLONOSCOPY WITH PROPOFOL N/A 06/16/2021   Procedure: COLONOSCOPY WITH PROPOFOL;  Surgeon: Daneil Dolin, MD;  [facility];  Service: Endoscopy;  Laterality: N/A;  9:30am (dialysis pt)   ESOPHAGOGASTRODUODENOSCOPY  09/05/2011   IWL:NLGXQ hiatal hernia; remainder of exam normal. No explanation for patient's abdominal pain with today's examination   ESOPHAGOGASTRODUODENOSCOPY N/A 12/17/2013   Dr. [name]: gastric erythema, erosion, mild chronic inflammation on path    ESOPHAGOGASTRODUODENOSCOPY (EGD) WITH PROPOFOL N/A 06/16/2021   Procedure: ESOPHAGOGASTRODUODENOSCOPY (EGD) WITH PROPOFOL;  Surgeon: Daneil Dolin, MD;  [facility];  Service: Endoscopy;  Laterality: N/A;   EXCISION OF BREAST BIOPSY Right 10/12/2020   Procedure: EXCISION OF RIGHT BREAST BIOPSY;  Surgeon: Aviva Signs, MD;  [facility];  Service: General;  Laterality: Right;   LAPAROSCOPIC APPENDECTOMY  09/29/2011   Procedure: APPENDECTOMY LAPAROSCOPIC;  Surgeon: Jamesetta So, MD;  [facility];  Service: General;;  incidental appendectomy   LEFT HEART CATH AND  CORONARY ANGIOGRAPHY N/A 02/28/2019   Procedure: LEFT HEART CATH AND CORONARY ANGIOGRAPHY;  Surgeon: Jettie Booze, MD;  Location: [facility];  Service: Cardiovascular;  Laterality: N/A;   MASTECTOMY MODIFIED RADICAL Right 02/18/2020   Procedure: MASTECTOMY MODIFIED RADICAL;  Surgeon: Aviva Signs, MD;  [facility];  Service: General;  Laterality: Right;   MASTECTOMY, PARTIAL Right 07/13/2020   Procedure: RIGHT PARTIAL MASTECTOMY;  Surgeon: Aviva Signs, MD;  [facility];  Service: General;  Laterality: Right;   PARTIAL MASTECTOMY WITH NEEDLE LOCALIZATION AND AXILLARY SENTINEL LYMPH NODE BX Right 09/18/2018   Procedure: RIGHT PARTIAL MASTECTOMY AFTER NEEDLE LOCALIZATION, SENTINEL LYMPH NODE BIOPSY RIGHT AXILLA;  Surgeon: Aviva Signs, MD;  [facility];  Service: General;  Laterality: Right;   POLYPECTOMY  06/16/2021   Procedure: POLYPECTOMY;  Surgeon: Daneil Dolin, MD;  [facility];  Service: Endoscopy;;   SIMPLE MASTECTOMY WITH AXILLARY SENTINEL NODE BIOPSY Left 06/15/2020   Procedure: LEFT SIMPLE MASTECTOMY;  Surgeon: Aviva Signs, MD;  [facility];  Service: General;  Laterality: Left;   Family History  Problem Relation Age of Onset   Hypertension Mother    Coronary artery disease Mother    Diabetes Mother    Hypertension Sister    Coronary artery disease Sister    Hypertension Brother    Heart attack Father    Hypertension Son    Heart attack Maternal Aunt    Hypertension Maternal Aunt    Diabetes Maternal Aunt    Heart attack Maternal Uncle    Hypertension Maternal Uncle    Diabetes Maternal Uncle    Heart attack Paternal Aunt    Hypertension Paternal Aunt    Diabetes Paternal Aunt    Heart attack Paternal Uncle    Hypertension Paternal Uncle    Diabetes Paternal Uncle    Heart attack Maternal Grandmother    Heart attack Maternal Grandfather    Heart attack Paternal Grandmother    Heart attack Paternal Grandfather     Colon cancer Neg Hx    Social History   Socioeconomic History   Marital status: Married    Spouse name: Not on file   Number of children: Not on file   Years of education: Not on file   Highest education level: Not on file  Occupational History   Occupation: Merchandiser, retail: [id number]  Tobacco Use   Smoking status: Never   Smokeless tobacco: Never  Vaping Use   Vaping Use: Never used  Substance and Sexual Activity   Alcohol use: No  Drug use: No   Sexual activity: Yes    Birth control/protection: Surgical  Other Topics Concern   Not on file  Social History Narrative   Works at [employer] in [place].    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   Also has to get items from high shelves-causes achy pain in shoulder area      Married.   Children are grown, out of house.   Social Determinants of Health   Financial Resource Strain: Low Risk    Difficulty of Paying Living Expenses: Not hard at all  Food Insecurity: No Food Insecurity   Worried About Charity fundraiser in the Last Year: Never true   [place] in the Last Year: Never true  Transportation Needs: No Transportation Needs   Lack of Transportation (Medical): No   Lack of Transportation (Non-Medical): No  Physical Activity: Inactive   Days of Exercise per Week: 0 days   Minutes of Exercise per Session: 0 min  Stress: No Stress Concern Present   Feeling of Stress : Not at all  Social Connections: Socially Integrated   Frequency of Communication with Friends and Family: More than three times a week   Frequency of Social Gatherings with Friends and Family: More than three times a week   Attends Religious Services: More than 4 times per year   Active Member of [employer] or Organizations: Yes   Attends Music therapist: More than 4 times per year   Marital Status: Married    Tobacco Counseling Counseling given: Not Answered   Clinical Intake:  Pre-visit preparation  completed: Yes  Pain : No/denies pain Pain Score: 0-No pain     BMI - recorded: 34.2 Nutritional Status: BMI > 30  Obese Nutritional Risks: None Diabetes: No  How often do you need to have someone help you when you read instructions, pamphlets, or other written materials from your doctor or pharmacy?: 1 - Never  Diabetic?NO  Interpreter Needed?: No  Information entered by :: mj Zaryan Yakubov, lpn   Activities of Daily Living In your present state of health, do you have any difficulty performing the following activities: 09/09/2021 06/13/2021  Hearing? N N  Vision? N N  Difficulty concentrating or making decisions? N N  Walking or climbing stairs? N N  Dressing or bathing? N N  Doing errands, shopping? N N  Preparing Food and eating ? N -  Using the Toilet? N -  In the past six months, have you accidently leaked urine? N -  Do you have problems with loss of bowel control? N -  Managing your Medications? N -  Managing your Finances? N -  Housekeeping or managing your Housekeeping? N -  Some recent data might be hidden    Patient Care Team: [name], MD as PCP - General (Family Medicine) [name], [name], MD as PCP - Cardiology (Cardiology) [name] [name], MD (Gastroenterology) [name], MD as Consulting Physician (Cardiology) [name], RN as Oncology Nurse Navigator (Oncology)  Indicate any recent Medical Services you may have received from other than [facility] providers in the past year (date may be approximate).     Assessment:   This is a routine wellness examination for [place].  Hearing/Vision screen Hearing Screening - Comments:: No hearing issues.  Vision Screening - Comments:: Glasses. [facility] 2021  Dietary issues and exercise activities discussed: Current Exercise Habits: The patient does not participate  in regular exercise at present, Exercise limited by: cardiac condition(s)   Goals Addressed             This Visit's  Progress    Exercise 3x per week (30 min per time)       Try to exercise more. "Get a kidney"       Depression Screen PHQ 2/9 Scores 09/09/2021 04/05/2021 01/06/2021 07/16/2019 05/16/2018 08/28/2017 04/17/2017  PHQ - 2 Score 0 0 0 0 0 0 0    Fall Risk Fall Risk  09/09/2021 04/05/2021 01/06/2021 05/27/2020 03/02/2020  Falls in the past year? 0 0 0 0 0  Number falls in past yr: 0 - 0 - -  Injury with Fall? 0 - 0 - -  Risk for fall due to : No Fall Risks No Fall Risks No Fall Risks - -  Follow up Falls prevention discussed Falls evaluation completed Falls evaluation completed Falls evaluation completed Falls evaluation completed    FALL RISK PREVENTION PERTAINING TO THE HOME:  Any stairs in or around the home? Yes  If so, are there any without handrails? No  Home free of loose throw rugs in walkways, pet beds, electrical cords, etc? Yes  Adequate lighting in your home to reduce risk of falls? Yes   ASSISTIVE DEVICES UTILIZED TO PREVENT FALLS:  Life alert? No  Use of a cane, walker or w/c? No  Grab bars in the bathroom? Yes  Shower chair or bench in shower? No  Elevated toilet seat or a handicapped toilet? No   TIMED UP AND GO:  Was the test performed? No .  Phone visit.   Cognitive Function:     6CIT Screen 09/09/2021  What Year? 0 points  What month? 0 points  What time? 0 points  Count back from 20 0 points  Months in reverse 2 points  Repeat phrase 2 points  Total Score 4    Immunizations Immunization History  Administered Date(s) Administered   Hepatitis B 03/27/2017   Hepatitis B, adult 03/27/2017, 05/02/2017, 05/31/2017, 10/04/2017, 09/08/2019, 10/01/2019, 10/30/2019   Hepb-cpg 03/03/2020   Influenza Whole 05/22/2007   Influenza, Seasonal, Injecte, Preservative Fre 05/10/2018   Influenza,inj,Quad PF,6+ Mos 04/29/2019, 05/10/2020, 05/06/2021   Moderna Sars-Covid-2 Vaccination 11/11/2019, 12/10/2019, 07/02/2020   Pfizer Covid-19 Vaccine Bivalent Booster 63yrs & up  09/09/2021   Pneumococcal Conjugate-13 03/15/2017, 02/07/2019   Pneumococcal Polysaccharide-23 08/27/2015, 10/01/2020   Td 07/20/2005    TDAP status: Due, Education has been provided regarding the importance of this vaccine. Advised may receive this vaccine at local pharmacy or Health Dept. Aware to provide a copy of the vaccination record if obtained from local pharmacy or Health Dept. Verbalized acceptance and understanding.  Flu Vaccine status: Up to date  Pneumococcal vaccine status: Up to date  Covid-19 vaccine status: Completed vaccines  Qualifies for Shingles Vaccine? Yes   Zostavax completed No   Shingrix Completed?: No.    Education has been provided regarding the importance of this vaccine. Patient has been advised to call insurance company to determine out of pocket expense if they have not yet received this vaccine. Advised may also receive vaccine at local pharmacy or Health Dept. Verbalized acceptance and understanding.  Screening Tests Health Maintenance  Topic Date Due   Hepatitis C Screening  Never done   Zoster Vaccines- Shingrix (1 of 2) Never done   URINE MICROALBUMIN  03/18/2015   TETANUS/TDAP  07/21/2015   OPHTHALMOLOGY EXAM  09/15/2015   FOOT EXAM  08/26/2016  HEMOGLOBIN A1C  05/27/2021   MAMMOGRAM  09/01/2021   PAP SMEAR-Modifier  06/04/2022   COLONOSCOPY (Pts 45-35yrs Insurance coverage will need to be confirmed)  06/17/2031   INFLUENZA VACCINE  Completed   COVID-19 Vaccine  Completed   HIV Screening  Completed   HPV VACCINES  Aged Out    Health Maintenance  Health Maintenance Due  Topic Date Due   Hepatitis C Screening  Never done   Zoster Vaccines- Shingrix (1 of 2) Never done   URINE MICROALBUMIN  03/18/2015   TETANUS/TDAP  07/21/2015   OPHTHALMOLOGY EXAM  09/15/2015   FOOT EXAM  08/26/2016   HEMOGLOBIN A1C  05/27/2021   MAMMOGRAM  09/01/2021    Colorectal cancer screening: Type of screening: Colonoscopy. Completed 06/16/2021. Repeat  every 10 years  Mammogram status: Completed 09/12/2019. Repeat every year  Bone Density status: Completed 03/24/2021. Results reflect: Bone density results: OSTEOPENIA. Repeat every 2 years.  Lung Cancer Screening: (Low Dose CT Chest recommended if Age 46-80 years, 30 pack-year currently smoking OR have quit w/in 15years.) does not qualify.   Additional Screening:  Hepatitis C Screening: does qualify; Completed DUE  Vision Screening: Recommended annual ophthalmology exams for early detection of glaucoma and other disorders of the eye. Is the patient up to date with their annual eye exam?  No  Who is the provider or what is the name of the office in which the patient attends annual eye exams? [facility] If pt is not established with a provider, would they like to be referred to a provider to establish care? No .   Dental Screening: Recommended annual dental exams for proper oral hygiene  Community Resource Referral / Chronic Care Management: CRR required this visit?  No   CCM required this visit?  No      Plan:     I have personally reviewed and noted the following in the patients chart:   Medical and social history Use of alcohol, tobacco or illicit drugs  Current medications and supplements including opioid prescriptions. Patient is currently taking opioid prescriptions. Information provided to patient regarding non-opioid alternatives. Patient advised to discuss non-opioid treatment plan with their provider. Functional ability and status Nutritional status Physical activity Advanced directives List of other physicians Hospitalizations, surgeries, and ER visits in previous 12 months Vitals Screenings to include cognitive, depression, and falls Referrals and appointments  In addition, I have reviewed and discussed with patient certain preventive protocols, quality metrics, and best practice recommendations. A written personalized care plan for preventive services as  well as general preventive health recommendations were provided to patient.     Megan Driver, LPN   [id number]/[id number]   Nurse Notes: Current dialysis patient. Up to date on health maintenance and vaccines. Discussed shingrix and tdap and how to obtain.

## 2021-09-12 DIAGNOSIS — Z992 Dependence on renal dialysis: Secondary | ICD-10-CM | POA: Diagnosis not present

## 2021-09-12 DIAGNOSIS — N2581 Secondary hyperparathyroidism of renal origin: Secondary | ICD-10-CM | POA: Diagnosis not present

## 2021-09-12 DIAGNOSIS — D509 Iron deficiency anemia, unspecified: Secondary | ICD-10-CM | POA: Diagnosis not present

## 2021-09-12 DIAGNOSIS — D631 Anemia in chronic kidney disease: Secondary | ICD-10-CM | POA: Diagnosis not present

## 2021-09-12 DIAGNOSIS — N186 End stage renal disease: Secondary | ICD-10-CM | POA: Diagnosis not present

## 2021-09-12 DIAGNOSIS — E1129 Type 2 diabetes mellitus with other diabetic kidney complication: Secondary | ICD-10-CM | POA: Diagnosis not present

## 2021-09-14 DIAGNOSIS — E1129 Type 2 diabetes mellitus with other diabetic kidney complication: Secondary | ICD-10-CM | POA: Diagnosis not present

## 2021-09-14 DIAGNOSIS — E119 Type 2 diabetes mellitus without complications: Secondary | ICD-10-CM | POA: Diagnosis not present

## 2021-09-14 DIAGNOSIS — N186 End stage renal disease: Secondary | ICD-10-CM | POA: Diagnosis not present

## 2021-09-14 DIAGNOSIS — Z992 Dependence on renal dialysis: Secondary | ICD-10-CM | POA: Diagnosis not present

## 2021-09-14 DIAGNOSIS — N2581 Secondary hyperparathyroidism of renal origin: Secondary | ICD-10-CM | POA: Diagnosis not present

## 2021-09-14 DIAGNOSIS — N040 Nephrotic syndrome with minor glomerular abnormality: Secondary | ICD-10-CM | POA: Diagnosis not present

## 2021-09-14 DIAGNOSIS — D631 Anemia in chronic kidney disease: Secondary | ICD-10-CM | POA: Diagnosis not present

## 2021-09-14 DIAGNOSIS — D509 Iron deficiency anemia, unspecified: Secondary | ICD-10-CM | POA: Diagnosis not present

## 2021-09-16 DIAGNOSIS — N2581 Secondary hyperparathyroidism of renal origin: Secondary | ICD-10-CM | POA: Diagnosis not present

## 2021-09-16 DIAGNOSIS — D509 Iron deficiency anemia, unspecified: Secondary | ICD-10-CM | POA: Diagnosis not present

## 2021-09-16 DIAGNOSIS — Z992 Dependence on renal dialysis: Secondary | ICD-10-CM | POA: Diagnosis not present

## 2021-09-16 DIAGNOSIS — N186 End stage renal disease: Secondary | ICD-10-CM | POA: Diagnosis not present

## 2021-09-16 DIAGNOSIS — D631 Anemia in chronic kidney disease: Secondary | ICD-10-CM | POA: Diagnosis not present

## 2021-09-16 DIAGNOSIS — E1129 Type 2 diabetes mellitus with other diabetic kidney complication: Secondary | ICD-10-CM | POA: Diagnosis not present

## 2021-09-19 DIAGNOSIS — N186 End stage renal disease: Secondary | ICD-10-CM | POA: Diagnosis not present

## 2021-09-19 DIAGNOSIS — Z992 Dependence on renal dialysis: Secondary | ICD-10-CM | POA: Diagnosis not present

## 2021-09-19 DIAGNOSIS — D631 Anemia in chronic kidney disease: Secondary | ICD-10-CM | POA: Diagnosis not present

## 2021-09-19 DIAGNOSIS — E1129 Type 2 diabetes mellitus with other diabetic kidney complication: Secondary | ICD-10-CM | POA: Diagnosis not present

## 2021-09-19 DIAGNOSIS — D509 Iron deficiency anemia, unspecified: Secondary | ICD-10-CM | POA: Diagnosis not present

## 2021-09-19 DIAGNOSIS — N2581 Secondary hyperparathyroidism of renal origin: Secondary | ICD-10-CM | POA: Diagnosis not present

## 2021-09-21 DIAGNOSIS — E1129 Type 2 diabetes mellitus with other diabetic kidney complication: Secondary | ICD-10-CM | POA: Diagnosis not present

## 2021-09-21 DIAGNOSIS — D631 Anemia in chronic kidney disease: Secondary | ICD-10-CM | POA: Diagnosis not present

## 2021-09-21 DIAGNOSIS — N186 End stage renal disease: Secondary | ICD-10-CM | POA: Diagnosis not present

## 2021-09-21 DIAGNOSIS — Z992 Dependence on renal dialysis: Secondary | ICD-10-CM | POA: Diagnosis not present

## 2021-09-21 DIAGNOSIS — N2581 Secondary hyperparathyroidism of renal origin: Secondary | ICD-10-CM | POA: Diagnosis not present

## 2021-09-21 DIAGNOSIS — D509 Iron deficiency anemia, unspecified: Secondary | ICD-10-CM | POA: Diagnosis not present

## 2021-09-23 DIAGNOSIS — N2581 Secondary hyperparathyroidism of renal origin: Secondary | ICD-10-CM | POA: Diagnosis not present

## 2021-09-23 DIAGNOSIS — D509 Iron deficiency anemia, unspecified: Secondary | ICD-10-CM | POA: Diagnosis not present

## 2021-09-23 DIAGNOSIS — N186 End stage renal disease: Secondary | ICD-10-CM | POA: Diagnosis not present

## 2021-09-23 DIAGNOSIS — E1129 Type 2 diabetes mellitus with other diabetic kidney complication: Secondary | ICD-10-CM | POA: Diagnosis not present

## 2021-09-23 DIAGNOSIS — Z992 Dependence on renal dialysis: Secondary | ICD-10-CM | POA: Diagnosis not present

## 2021-09-23 DIAGNOSIS — D631 Anemia in chronic kidney disease: Secondary | ICD-10-CM | POA: Diagnosis not present

## 2021-09-26 DIAGNOSIS — E1129 Type 2 diabetes mellitus with other diabetic kidney complication: Secondary | ICD-10-CM | POA: Diagnosis not present

## 2021-09-26 DIAGNOSIS — D509 Iron deficiency anemia, unspecified: Secondary | ICD-10-CM | POA: Diagnosis not present

## 2021-09-26 DIAGNOSIS — N2581 Secondary hyperparathyroidism of renal origin: Secondary | ICD-10-CM | POA: Diagnosis not present

## 2021-09-26 DIAGNOSIS — D631 Anemia in chronic kidney disease: Secondary | ICD-10-CM | POA: Diagnosis not present

## 2021-09-26 DIAGNOSIS — N186 End stage renal disease: Secondary | ICD-10-CM | POA: Diagnosis not present

## 2021-09-26 DIAGNOSIS — Z992 Dependence on renal dialysis: Secondary | ICD-10-CM | POA: Diagnosis not present

## 2021-09-27 ENCOUNTER — Ambulatory Visit: Payer: Medicare Other | Admitting: Gastroenterology

## 2021-09-28 DIAGNOSIS — D631 Anemia in chronic kidney disease: Secondary | ICD-10-CM | POA: Diagnosis not present

## 2021-09-28 DIAGNOSIS — N186 End stage renal disease: Secondary | ICD-10-CM | POA: Diagnosis not present

## 2021-09-28 DIAGNOSIS — D509 Iron deficiency anemia, unspecified: Secondary | ICD-10-CM | POA: Diagnosis not present

## 2021-09-28 DIAGNOSIS — Z992 Dependence on renal dialysis: Secondary | ICD-10-CM | POA: Diagnosis not present

## 2021-09-28 DIAGNOSIS — N2581 Secondary hyperparathyroidism of renal origin: Secondary | ICD-10-CM | POA: Diagnosis not present

## 2021-09-28 DIAGNOSIS — E1129 Type 2 diabetes mellitus with other diabetic kidney complication: Secondary | ICD-10-CM | POA: Diagnosis not present

## 2021-09-30 DIAGNOSIS — E1129 Type 2 diabetes mellitus with other diabetic kidney complication: Secondary | ICD-10-CM | POA: Diagnosis not present

## 2021-09-30 DIAGNOSIS — D509 Iron deficiency anemia, unspecified: Secondary | ICD-10-CM | POA: Diagnosis not present

## 2021-09-30 DIAGNOSIS — N186 End stage renal disease: Secondary | ICD-10-CM | POA: Diagnosis not present

## 2021-09-30 DIAGNOSIS — Z992 Dependence on renal dialysis: Secondary | ICD-10-CM | POA: Diagnosis not present

## 2021-09-30 DIAGNOSIS — N2581 Secondary hyperparathyroidism of renal origin: Secondary | ICD-10-CM | POA: Diagnosis not present

## 2021-09-30 DIAGNOSIS — D631 Anemia in chronic kidney disease: Secondary | ICD-10-CM | POA: Diagnosis not present

## 2021-10-03 DIAGNOSIS — E1129 Type 2 diabetes mellitus with other diabetic kidney complication: Secondary | ICD-10-CM | POA: Diagnosis not present

## 2021-10-03 DIAGNOSIS — Z992 Dependence on renal dialysis: Secondary | ICD-10-CM | POA: Diagnosis not present

## 2021-10-03 DIAGNOSIS — D509 Iron deficiency anemia, unspecified: Secondary | ICD-10-CM | POA: Diagnosis not present

## 2021-10-03 DIAGNOSIS — D631 Anemia in chronic kidney disease: Secondary | ICD-10-CM | POA: Diagnosis not present

## 2021-10-03 DIAGNOSIS — N186 End stage renal disease: Secondary | ICD-10-CM | POA: Diagnosis not present

## 2021-10-03 DIAGNOSIS — N2581 Secondary hyperparathyroidism of renal origin: Secondary | ICD-10-CM | POA: Diagnosis not present

## 2021-10-04 ENCOUNTER — Ambulatory Visit (INDEPENDENT_AMBULATORY_CARE_PROVIDER_SITE_OTHER): Payer: Medicare Other | Admitting: General Surgery

## 2021-10-04 ENCOUNTER — Encounter: Payer: Self-pay | Admitting: General Surgery

## 2021-10-04 ENCOUNTER — Other Ambulatory Visit: Payer: Self-pay

## 2021-10-04 VITALS — BP 146/78 | HR 85 | Temp 97.3°F | Resp 12 | Ht 61.0 in | Wt 185.0 lb

## 2021-10-04 DIAGNOSIS — M7918 Myalgia, other site: Secondary | ICD-10-CM

## 2021-10-04 NOTE — Progress Notes (Signed)
Subjective:     Megan [name]  Patient here for second opinion concerning her right axillary and upper chest pain.  She has had this for some time now.  It seems to come and go.  It did go away for a while but recently came back.  She was seen by Dr. Delton Coombes of oncology and a bone scan was negative for any metastatic disease.  She states that sometimes the pain goes down her right arm.  It is sometimes made worse with movement. Objective:    BP (!) 146/78    Pulse 85    Temp (!) 97.3 F (36.3 C) (Other (Comment))    Resp 12    Ht 5\' 1"  (1.549 m)    Wt 185 lb (83.9 kg)    SpO2 93%    BMI 34.96 kg/m   General:  alert, cooperative, and no distress  Right breast examination reveals a well-healed surgical scar without attachment to the chest wall.  The right axilla has no palpable nodes.  She does have some point tenderness over the pectoralis major muscle superiorly.  No soft tissue abnormalities are noted.     Assessment:    Musculoskeletal pain of unknown etiology.  There does not appear to be evidence of recurrence of her breast cancer.    Plan:   I told her to stay on the gabapentin.  She should maybe try physical therapy exercises for her right arm and shoulder.  Nothing more I can offer from the surgical standpoint.  They understand and agree.  Follow-up here as needed.

## 2021-10-05 DIAGNOSIS — Z992 Dependence on renal dialysis: Secondary | ICD-10-CM | POA: Diagnosis not present

## 2021-10-05 DIAGNOSIS — D631 Anemia in chronic kidney disease: Secondary | ICD-10-CM | POA: Diagnosis not present

## 2021-10-05 DIAGNOSIS — N186 End stage renal disease: Secondary | ICD-10-CM | POA: Diagnosis not present

## 2021-10-05 DIAGNOSIS — E1129 Type 2 diabetes mellitus with other diabetic kidney complication: Secondary | ICD-10-CM | POA: Diagnosis not present

## 2021-10-05 DIAGNOSIS — D509 Iron deficiency anemia, unspecified: Secondary | ICD-10-CM | POA: Diagnosis not present

## 2021-10-05 DIAGNOSIS — N2581 Secondary hyperparathyroidism of renal origin: Secondary | ICD-10-CM | POA: Diagnosis not present

## 2021-10-07 ENCOUNTER — Telehealth: Payer: Self-pay | Admitting: *Deleted

## 2021-10-07 DIAGNOSIS — N2581 Secondary hyperparathyroidism of renal origin: Secondary | ICD-10-CM | POA: Diagnosis not present

## 2021-10-07 DIAGNOSIS — E1129 Type 2 diabetes mellitus with other diabetic kidney complication: Secondary | ICD-10-CM | POA: Diagnosis not present

## 2021-10-07 DIAGNOSIS — Z992 Dependence on renal dialysis: Secondary | ICD-10-CM | POA: Diagnosis not present

## 2021-10-07 DIAGNOSIS — N186 End stage renal disease: Secondary | ICD-10-CM | POA: Diagnosis not present

## 2021-10-07 DIAGNOSIS — D631 Anemia in chronic kidney disease: Secondary | ICD-10-CM | POA: Diagnosis not present

## 2021-10-07 DIAGNOSIS — D509 Iron deficiency anemia, unspecified: Secondary | ICD-10-CM | POA: Diagnosis not present

## 2021-10-07 MED ORDER — HYDROCODONE-ACETAMINOPHEN 5-325 MG PO TABS: 1.0000 | ORAL_TABLET | Freq: Four times a day (QID) | ORAL | 0 refills | Status: DC | PRN

## 2021-10-07 NOTE — Telephone Encounter (Signed)
Received call from patient.   Requested prescription for pain medication. Noted that she has been given Norco 5/325mg  in October 2022 by Evalee Jefferson, PA.   Please advise.

## 2021-10-07 NOTE — Addendum Note (Signed)
Addended by: Aviva Signs A on: 10/07/2021 11:31 AM   Modules accepted: Orders

## 2021-10-10 DIAGNOSIS — Z20822 Contact with and (suspected) exposure to covid-19: Secondary | ICD-10-CM | POA: Diagnosis not present

## 2021-10-10 DIAGNOSIS — D631 Anemia in chronic kidney disease: Secondary | ICD-10-CM | POA: Diagnosis not present

## 2021-10-10 DIAGNOSIS — N186 End stage renal disease: Secondary | ICD-10-CM | POA: Diagnosis not present

## 2021-10-10 DIAGNOSIS — E1129 Type 2 diabetes mellitus with other diabetic kidney complication: Secondary | ICD-10-CM | POA: Diagnosis not present

## 2021-10-10 DIAGNOSIS — N2581 Secondary hyperparathyroidism of renal origin: Secondary | ICD-10-CM | POA: Diagnosis not present

## 2021-10-10 DIAGNOSIS — D509 Iron deficiency anemia, unspecified: Secondary | ICD-10-CM | POA: Diagnosis not present

## 2021-10-10 DIAGNOSIS — Z992 Dependence on renal dialysis: Secondary | ICD-10-CM | POA: Diagnosis not present

## 2021-10-12 DIAGNOSIS — N2581 Secondary hyperparathyroidism of renal origin: Secondary | ICD-10-CM | POA: Diagnosis not present

## 2021-10-12 DIAGNOSIS — N040 Nephrotic syndrome with minor glomerular abnormality: Secondary | ICD-10-CM | POA: Diagnosis not present

## 2021-10-12 DIAGNOSIS — E119 Type 2 diabetes mellitus without complications: Secondary | ICD-10-CM | POA: Diagnosis not present

## 2021-10-12 DIAGNOSIS — D631 Anemia in chronic kidney disease: Secondary | ICD-10-CM | POA: Diagnosis not present

## 2021-10-12 DIAGNOSIS — N186 End stage renal disease: Secondary | ICD-10-CM | POA: Diagnosis not present

## 2021-10-12 DIAGNOSIS — Z20822 Contact with and (suspected) exposure to covid-19: Secondary | ICD-10-CM | POA: Diagnosis not present

## 2021-10-12 DIAGNOSIS — R2231 Localized swelling, mass and lump, right upper limb: Secondary | ICD-10-CM | POA: Diagnosis not present

## 2021-10-12 DIAGNOSIS — E1129 Type 2 diabetes mellitus with other diabetic kidney complication: Secondary | ICD-10-CM | POA: Diagnosis not present

## 2021-10-12 DIAGNOSIS — D509 Iron deficiency anemia, unspecified: Secondary | ICD-10-CM | POA: Diagnosis not present

## 2021-10-12 DIAGNOSIS — Z992 Dependence on renal dialysis: Secondary | ICD-10-CM | POA: Diagnosis not present

## 2021-10-13 ENCOUNTER — Other Ambulatory Visit: Payer: Self-pay

## 2021-10-13 DIAGNOSIS — R2231 Localized swelling, mass and lump, right upper limb: Secondary | ICD-10-CM | POA: Diagnosis not present

## 2021-10-13 DIAGNOSIS — N186 End stage renal disease: Secondary | ICD-10-CM | POA: Diagnosis not present

## 2021-10-13 DIAGNOSIS — Z20822 Contact with and (suspected) exposure to covid-19: Secondary | ICD-10-CM | POA: Diagnosis not present

## 2021-10-13 DIAGNOSIS — Z992 Dependence on renal dialysis: Secondary | ICD-10-CM | POA: Diagnosis not present

## 2021-10-14 DIAGNOSIS — N2581 Secondary hyperparathyroidism of renal origin: Secondary | ICD-10-CM | POA: Diagnosis not present

## 2021-10-14 DIAGNOSIS — N186 End stage renal disease: Secondary | ICD-10-CM | POA: Diagnosis not present

## 2021-10-14 DIAGNOSIS — D509 Iron deficiency anemia, unspecified: Secondary | ICD-10-CM | POA: Diagnosis not present

## 2021-10-14 DIAGNOSIS — D631 Anemia in chronic kidney disease: Secondary | ICD-10-CM | POA: Diagnosis not present

## 2021-10-14 DIAGNOSIS — Z992 Dependence on renal dialysis: Secondary | ICD-10-CM | POA: Diagnosis not present

## 2021-10-14 DIAGNOSIS — E1129 Type 2 diabetes mellitus with other diabetic kidney complication: Secondary | ICD-10-CM | POA: Diagnosis not present

## 2021-10-17 DIAGNOSIS — E1129 Type 2 diabetes mellitus with other diabetic kidney complication: Secondary | ICD-10-CM | POA: Diagnosis not present

## 2021-10-17 DIAGNOSIS — N186 End stage renal disease: Secondary | ICD-10-CM | POA: Diagnosis not present

## 2021-10-17 DIAGNOSIS — D631 Anemia in chronic kidney disease: Secondary | ICD-10-CM | POA: Diagnosis not present

## 2021-10-17 DIAGNOSIS — Z992 Dependence on renal dialysis: Secondary | ICD-10-CM | POA: Diagnosis not present

## 2021-10-17 DIAGNOSIS — D509 Iron deficiency anemia, unspecified: Secondary | ICD-10-CM | POA: Diagnosis not present

## 2021-10-17 DIAGNOSIS — N2581 Secondary hyperparathyroidism of renal origin: Secondary | ICD-10-CM | POA: Diagnosis not present

## 2021-10-19 DIAGNOSIS — N2581 Secondary hyperparathyroidism of renal origin: Secondary | ICD-10-CM | POA: Diagnosis not present

## 2021-10-19 DIAGNOSIS — Z992 Dependence on renal dialysis: Secondary | ICD-10-CM | POA: Diagnosis not present

## 2021-10-19 DIAGNOSIS — N186 End stage renal disease: Secondary | ICD-10-CM | POA: Diagnosis not present

## 2021-10-19 DIAGNOSIS — D509 Iron deficiency anemia, unspecified: Secondary | ICD-10-CM | POA: Diagnosis not present

## 2021-10-19 DIAGNOSIS — E1129 Type 2 diabetes mellitus with other diabetic kidney complication: Secondary | ICD-10-CM | POA: Diagnosis not present

## 2021-10-19 DIAGNOSIS — D631 Anemia in chronic kidney disease: Secondary | ICD-10-CM | POA: Diagnosis not present

## 2021-10-21 DIAGNOSIS — D631 Anemia in chronic kidney disease: Secondary | ICD-10-CM | POA: Diagnosis not present

## 2021-10-21 DIAGNOSIS — N2581 Secondary hyperparathyroidism of renal origin: Secondary | ICD-10-CM | POA: Diagnosis not present

## 2021-10-21 DIAGNOSIS — D509 Iron deficiency anemia, unspecified: Secondary | ICD-10-CM | POA: Diagnosis not present

## 2021-10-21 DIAGNOSIS — Z992 Dependence on renal dialysis: Secondary | ICD-10-CM | POA: Diagnosis not present

## 2021-10-21 DIAGNOSIS — E1129 Type 2 diabetes mellitus with other diabetic kidney complication: Secondary | ICD-10-CM | POA: Diagnosis not present

## 2021-10-21 DIAGNOSIS — N186 End stage renal disease: Secondary | ICD-10-CM | POA: Diagnosis not present

## 2021-10-24 ENCOUNTER — Encounter: Payer: Self-pay | Admitting: Orthopedic Surgery

## 2021-10-24 ENCOUNTER — Ambulatory Visit (INDEPENDENT_AMBULATORY_CARE_PROVIDER_SITE_OTHER): Payer: Medicare Other | Admitting: Orthopedic Surgery

## 2021-10-24 ENCOUNTER — Other Ambulatory Visit: Payer: Self-pay

## 2021-10-24 VITALS — BP 156/83 | HR 97 | Ht 62.0 in | Wt 183.8 lb

## 2021-10-24 DIAGNOSIS — E1129 Type 2 diabetes mellitus with other diabetic kidney complication: Secondary | ICD-10-CM | POA: Diagnosis not present

## 2021-10-24 DIAGNOSIS — N186 End stage renal disease: Secondary | ICD-10-CM | POA: Diagnosis not present

## 2021-10-24 DIAGNOSIS — D3612 Benign neoplasm of peripheral nerves and autonomic nervous system, upper limb, including shoulder: Secondary | ICD-10-CM

## 2021-10-24 DIAGNOSIS — D509 Iron deficiency anemia, unspecified: Secondary | ICD-10-CM | POA: Diagnosis not present

## 2021-10-24 DIAGNOSIS — D631 Anemia in chronic kidney disease: Secondary | ICD-10-CM | POA: Diagnosis not present

## 2021-10-24 DIAGNOSIS — Z992 Dependence on renal dialysis: Secondary | ICD-10-CM | POA: Diagnosis not present

## 2021-10-24 DIAGNOSIS — N2581 Secondary hyperparathyroidism of renal origin: Secondary | ICD-10-CM | POA: Diagnosis not present

## 2021-10-24 NOTE — Patient Instructions (Signed)
Neuro fibroma ?

## 2021-10-24 NOTE — Progress Notes (Signed)
Chief Complaint  ?Patient presents with  ? Wrist Problem  ?  Knot on LEFT wrist/ just below index finger/ painful if she hits it on something ?Noticed it a couple of months ago  ? ? ?61 year old female has a shunt for dialysis access on the left complains of pain over the dorsum of her left index finger near the metacarpal phalangeal joint she says is very sensitive and tender. ? ?Her shunt is functioning well ? ?She is a breast cancer survivor although she has had some complications from the axillary node dissection ? ?She says although this is sensitive its not disabling ? ? ?Past Medical History:  ?Diagnosis Date  ? (HFpEF) heart failure with preserved ejection fraction ([place])   ? a. 01/2019 Echo: EF 55-60%, mild conc LVH. DD.  Torn MV chordae.  ? Anemia   ? Atypical chest pain   ? a. 08/2018 MV: EF 59%, no ischemia; b. 02/2019 Cath: nonobs dzs.  ? Blood transfusion without reported diagnosis   ? Breast cancer ([place]) 10/12/2020  ? Cataract   ? ESRD (end stage renal disease) on dialysis [facility])   ? a. HD T, T, S  ? Essential hypertension, benign   ? GERD (gastroesophageal reflux disease)   ? Headache   ? Hemorrhoids   ? Mixed hyperlipidemia   ? Morbid obesity ([place])   ? Non-obstructive CAD (coronary artery disease)   ? a. 02/2019 CathL LM nl, LAD 2m LCX nl, RCA 25p, EF 55-65%.  ? PONV (postoperative nausea and vomiting)   ? S/P colonoscopy Jan 2011  ? Dr. HBenson Norway sessile polyp (benign lymphoid), large hemorrhoids, repeat 5-10 years  ? 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 Type 2 diabetes mellitus ([place]   ? Wears glasses   ? ?Past Surgical History:  ?Procedure Laterality Date  ? ABDOMINAL HYSTERECTOMY    ? APPENDECTOMY    ? ARTERY BIOPSY N/A 05/09/2018  ? Procedure: RIGHT TEMPORAL ARTERY BIOPSY;  Surgeon: WJudeth Horn MD;  [place]  Service: General;  Laterality: N/A;  ? BREAST BIOPSY Right 06/15/2020  ? Procedure: RIGHT BREAST BIOPSY;  Surgeon: JAviva Signs MD;  [facility]: [facility];  Service: General;  Laterality: Right;  ?  CATARACT EXTRACTION W/PHACO Left 02/09/2017  ? Procedure: CATARACT EXTRACTION PHACO AND INTRAOCULAR LENS PLACEMENT LEFT EYE;  Surgeon: HTonny Branch MD;  Location: [facility];  Service: Ophthalmology;  Laterality: Left;  CDE: 4.89  ? CATARACT EXTRACTION W/PHACO Right 06/04/2017  ? Procedure: CATARACT EXTRACTION PHACO AND INTRAOCULAR LENS PLACEMENT (IOC);  Surgeon: HTonny Branch MD;  Location: [facility];  Service: Ophthalmology;  Laterality: Right;  CDE: 4.12  ? CHOLECYSTECTOMY  09/29/2011  ? Procedure: LAPAROSCOPIC CHOLECYSTECTOMY;  Surgeon: MJamesetta So MD;  Location: [facility];  Service: General;  Laterality: N/A;  ? COLONOSCOPY  08/2009  ? Dr. HBenson Norway sessile polyp (benign lymphoid), large hemorrhoids, repeat 5-10 years  ? COLONOSCOPY N/A 06/12/2016  ? prominent hemorrhoids  ? COLONOSCOPY WITH PROPOFOL N/A 06/16/2021  ? Procedure: COLONOSCOPY WITH PROPOFOL;  Surgeon: RDaneil Dolin MD;  [facility];  Service: Endoscopy;  Laterality: N/A;  9:30am (dialysis pt)  ? ESOPHAGOGASTRODUODENOSCOPY  09/05/2011  ? [id number] hernia; remainder of exam normal. No explanation for patient's abdominal pain with today's examination  ? ESOPHAGOGASTRODUODENOSCOPY N/A 12/17/2013  ? Dr. RGala Romney gastric erythema, erosion, mild chronic inflammation on path   ? ESOPHAGOGASTRODUODENOSCOPY (EGD) WITH PROPOFOL N/A 06/16/2021  ? Procedure: ESOPHAGOGASTRODUODENOSCOPY (EGD) WITH PROPOFOL;  Surgeon: RDaneil Dolin MD;  [facility];  Service: Endoscopy;  Laterality: N/A;  ? EXCISION OF BREAST BIOPSY Right 10/12/2020  ? Procedure: EXCISION OF RIGHT BREAST BIOPSY;  Surgeon: Aviva Signs, MD;  Location: [facility];  Service: General;  Laterality: Right;  ? LAPAROSCOPIC APPENDECTOMY  09/29/2011  ? Procedure: APPENDECTOMY LAPAROSCOPIC;  Surgeon: Jamesetta So, MD;  Location: [facility];  Service: General;;  incidental appendectomy  ? LEFT HEART CATH AND CORONARY ANGIOGRAPHY N/A 02/28/2019  ? Procedure: LEFT HEART CATH AND CORONARY  ANGIOGRAPHY;  Surgeon: Jettie Booze, MD;  Location: [place] [facility];  Service: Cardiovascular;  Laterality: N/A;  ? MASTECTOMY MODIFIED RADICAL Right 02/18/2020  ? Procedure: MASTECTOMY MODIFIED RADICAL;  Surgeon: Aviva Signs, MD;  Location: [facility];  Service: General;  Laterality: Right;  ? MASTECTOMY, PARTIAL Right 07/13/2020  ? Procedure: RIGHT PARTIAL MASTECTOMY;  Surgeon: Aviva Signs, MD;  Location: [facility];  Service: General;  Laterality: Right;  ? PARTIAL MASTECTOMY WITH NEEDLE LOCALIZATION AND AXILLARY SENTINEL LYMPH NODE BX Right 09/18/2018  ? Procedure: RIGHT PARTIAL MASTECTOMY AFTER NEEDLE LOCALIZATION, SENTINEL LYMPH NODE BIOPSY RIGHT AXILLA;  Surgeon: Aviva Signs, MD;  Location: [facility];  Service: General;  Laterality: Right;  ? POLYPECTOMY  06/16/2021  ? Procedure: POLYPECTOMY;  Surgeon: Daneil Dolin, MD;  [facility];  Service: Endoscopy;;  ? SIMPLE MASTECTOMY WITH AXILLARY SENTINEL NODE BIOPSY Left 06/15/2020  ? Procedure: LEFT SIMPLE MASTECTOMY;  Surgeon: Aviva Signs, MD;  Location: [facility];  Service: General;  Laterality: Left;  ? ?BP (!) 156/83   Pulse 97   Ht '5\' 2"'$  (1.575 m)   Wt 183 lb 12.8 oz (83.4 kg)   BMI 33.62 kg/m?  ? ?She is awake alert and oriented x3 mood is pleasant affect is normal ? ?Her appearance is normal as well ? ?On the left index finger on the dorsum there is a hardened area is very small it is a couple of millimeters it does not appear to be cystic is flat and sessile it is very tender ? ?It does not interfere with function ? 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  ? Neurofibroma of hand Yes  ? ? ?I would not go after this, it is on the size of her shunt I would leave it alone she agrees ?

## 2021-10-26 DIAGNOSIS — N186 End stage renal disease: Secondary | ICD-10-CM | POA: Diagnosis not present

## 2021-10-26 DIAGNOSIS — Z992 Dependence on renal dialysis: Secondary | ICD-10-CM | POA: Diagnosis not present

## 2021-10-26 DIAGNOSIS — N2581 Secondary hyperparathyroidism of renal origin: Secondary | ICD-10-CM | POA: Diagnosis not present

## 2021-10-26 DIAGNOSIS — D631 Anemia in chronic kidney disease: Secondary | ICD-10-CM | POA: Diagnosis not present

## 2021-10-26 DIAGNOSIS — E1129 Type 2 diabetes mellitus with other diabetic kidney complication: Secondary | ICD-10-CM | POA: Diagnosis not present

## 2021-10-26 DIAGNOSIS — D509 Iron deficiency anemia, unspecified: Secondary | ICD-10-CM | POA: Diagnosis not present

## 2021-10-28 DIAGNOSIS — E1129 Type 2 diabetes mellitus with other diabetic kidney complication: Secondary | ICD-10-CM | POA: Diagnosis not present

## 2021-10-28 DIAGNOSIS — D509 Iron deficiency anemia, unspecified: Secondary | ICD-10-CM | POA: Diagnosis not present

## 2021-10-28 DIAGNOSIS — N186 End stage renal disease: Secondary | ICD-10-CM | POA: Diagnosis not present

## 2021-10-28 DIAGNOSIS — D631 Anemia in chronic kidney disease: Secondary | ICD-10-CM | POA: Diagnosis not present

## 2021-10-28 DIAGNOSIS — Z992 Dependence on renal dialysis: Secondary | ICD-10-CM | POA: Diagnosis not present

## 2021-10-28 DIAGNOSIS — N2581 Secondary hyperparathyroidism of renal origin: Secondary | ICD-10-CM | POA: Diagnosis not present

## 2021-10-31 DIAGNOSIS — Z992 Dependence on renal dialysis: Secondary | ICD-10-CM | POA: Diagnosis not present

## 2021-10-31 DIAGNOSIS — E1129 Type 2 diabetes mellitus with other diabetic kidney complication: Secondary | ICD-10-CM | POA: Diagnosis not present

## 2021-10-31 DIAGNOSIS — D631 Anemia in chronic kidney disease: Secondary | ICD-10-CM | POA: Diagnosis not present

## 2021-10-31 DIAGNOSIS — N2581 Secondary hyperparathyroidism of renal origin: Secondary | ICD-10-CM | POA: Diagnosis not present

## 2021-10-31 DIAGNOSIS — D509 Iron deficiency anemia, unspecified: Secondary | ICD-10-CM | POA: Diagnosis not present

## 2021-10-31 DIAGNOSIS — N186 End stage renal disease: Secondary | ICD-10-CM | POA: Diagnosis not present

## 2021-11-02 DIAGNOSIS — D631 Anemia in chronic kidney disease: Secondary | ICD-10-CM | POA: Diagnosis not present

## 2021-11-02 DIAGNOSIS — Z992 Dependence on renal dialysis: Secondary | ICD-10-CM | POA: Diagnosis not present

## 2021-11-02 DIAGNOSIS — N2581 Secondary hyperparathyroidism of renal origin: Secondary | ICD-10-CM | POA: Diagnosis not present

## 2021-11-02 DIAGNOSIS — N186 End stage renal disease: Secondary | ICD-10-CM | POA: Diagnosis not present

## 2021-11-02 DIAGNOSIS — E1129 Type 2 diabetes mellitus with other diabetic kidney complication: Secondary | ICD-10-CM | POA: Diagnosis not present

## 2021-11-02 DIAGNOSIS — D509 Iron deficiency anemia, unspecified: Secondary | ICD-10-CM | POA: Diagnosis not present

## 2021-11-04 ENCOUNTER — Telehealth: Payer: Self-pay | Admitting: Family Medicine

## 2021-11-04 ENCOUNTER — Telehealth: Payer: Self-pay

## 2021-11-04 DIAGNOSIS — E1129 Type 2 diabetes mellitus with other diabetic kidney complication: Secondary | ICD-10-CM | POA: Diagnosis not present

## 2021-11-04 DIAGNOSIS — Z992 Dependence on renal dialysis: Secondary | ICD-10-CM | POA: Diagnosis not present

## 2021-11-04 DIAGNOSIS — D631 Anemia in chronic kidney disease: Secondary | ICD-10-CM | POA: Diagnosis not present

## 2021-11-04 DIAGNOSIS — N186 End stage renal disease: Secondary | ICD-10-CM | POA: Diagnosis not present

## 2021-11-04 DIAGNOSIS — D509 Iron deficiency anemia, unspecified: Secondary | ICD-10-CM | POA: Diagnosis not present

## 2021-11-04 DIAGNOSIS — N2581 Secondary hyperparathyroidism of renal origin: Secondary | ICD-10-CM | POA: Diagnosis not present

## 2021-11-04 NOTE — Telephone Encounter (Signed)
Patient called to follow up on meds (taking Lipitor since 2012, on dialysis since 2017). Patient is concerned about poss side effect of kidney disease. ? ?Please advise at [phone] ?

## 2021-11-04 NOTE — Telephone Encounter (Signed)
Pt called  because her brother had a paper stating that he should stop taking his Liptor if you have CKD. Since she has CKD and has been taking Liptor for years now.She wants to know if she should stop taking her Liptor as well? ? ?Please advice ?

## 2021-11-07 DIAGNOSIS — E1129 Type 2 diabetes mellitus with other diabetic kidney complication: Secondary | ICD-10-CM | POA: Diagnosis not present

## 2021-11-07 DIAGNOSIS — Z992 Dependence on renal dialysis: Secondary | ICD-10-CM | POA: Diagnosis not present

## 2021-11-07 DIAGNOSIS — D509 Iron deficiency anemia, unspecified: Secondary | ICD-10-CM | POA: Diagnosis not present

## 2021-11-07 DIAGNOSIS — N2581 Secondary hyperparathyroidism of renal origin: Secondary | ICD-10-CM | POA: Diagnosis not present

## 2021-11-07 DIAGNOSIS — N186 End stage renal disease: Secondary | ICD-10-CM | POA: Diagnosis not present

## 2021-11-07 DIAGNOSIS — D631 Anemia in chronic kidney disease: Secondary | ICD-10-CM | POA: Diagnosis not present

## 2021-11-08 ENCOUNTER — Other Ambulatory Visit: Payer: Self-pay

## 2021-11-08 ENCOUNTER — Ambulatory Visit (INDEPENDENT_AMBULATORY_CARE_PROVIDER_SITE_OTHER): Payer: Medicare Other | Admitting: Family Medicine

## 2021-11-08 VITALS — BP 130/68 | HR 76 | Temp 97.0°F | Ht 62.0 in | Wt 184.4 lb

## 2021-11-08 DIAGNOSIS — Z7189 Other specified counseling: Secondary | ICD-10-CM

## 2021-11-08 MED ORDER — MECLIZINE HCL 25 MG PO TABS: 25.0000 mg | ORAL_TABLET | Freq: Three times a day (TID) | ORAL | 0 refills | Status: DC | PRN

## 2021-11-08 MED ORDER — NEBIVOLOL HCL 10 MG PO TABS: 10.0000 mg | ORAL_TABLET | Freq: Every day | ORAL | 3 refills | Status: DC

## 2021-11-08 NOTE — Progress Notes (Signed)
? ?Subjective:  ? ? Patient ID: Megan [name], female    DOB: 1961-03-27, 61 y.o.   MRN: [id number] ? ?HPI ? ?Patient has some concerns about her Lipitor.  Her brother was told that he should not take Lipitor if he has kidney problems.  She is on hemodialysis.  She denies any side effects from the Lipitor specifically myalgias.  On catheterization in 2020 she had nonobstructive coronary artery disease.  Cardiology recommended risk factor reduction.  This is the reason she is taking Lipitor.  I showed the patient the recommendations regarding renal dosing of Lipitor which includes use in hemodialysis without any dosage adjustment.  There is no contraindication to my knowledge to using it in a patient with renal problems. ?Past Medical History:  ?Diagnosis Date  ? (HFpEF) heart failure with preserved ejection fraction ([place])   ? a. 01/2019 Echo: EF 55-60%, mild conc LVH. DD.  Torn MV chordae.  ? Anemia   ? Atypical chest pain   ? a. 08/2018 MV: EF 59%, no ischemia; b. 02/2019 Cath: nonobs dzs.  ? Blood transfusion without reported diagnosis   ? Breast cancer ([place]) 10/12/2020  ? Cataract   ? ESRD (end stage renal disease) on dialysis [facility])   ? a. HD T, T, S  ? Essential hypertension, benign   ? GERD (gastroesophageal reflux disease)   ? Headache   ? Hemorrhoids   ? Mixed hyperlipidemia   ? Morbid obesity ([place])   ? Non-obstructive CAD (coronary artery disease)   ? a. 02/2019 CathL LM nl, LAD 43m LCX nl, RCA 25p, EF 55-65%.  ? PONV (postoperative nausea and vomiting)   ? S/P colonoscopy Jan 2011  ? Dr. HBenson Norway sessile polyp (benign lymphoid), large hemorrhoids, repeat 5-10 years  ? Temporal arteritis ([place]   ? Type 2 diabetes mellitus ([place]   ? Wears glasses   ? ?Past Surgical History:  ?Procedure Laterality Date  ? ABDOMINAL HYSTERECTOMY    ? APPENDECTOMY    ? ARTERY BIOPSY N/A 05/09/2018  ? Procedure: RIGHT TEMPORAL ARTERY BIOPSY;  Surgeon: WJudeth Horn MD;  [place]  Service: General;  Laterality: N/A;  ?  BREAST BIOPSY Right 06/15/2020  ? Procedure: RIGHT BREAST BIOPSY;  Surgeon: JAviva Signs MD;  Location: [facility];  Service: General;  Laterality: Right;  ? CATARACT EXTRACTION W/PHACO Left 02/09/2017  ? Procedure: CATARACT EXTRACTION PHACO AND INTRAOCULAR LENS PLACEMENT LEFT EYE;  Surgeon: HTonny Branch MD;  [facility]: [facility];  Service: Ophthalmology;  Laterality: Left;  CDE: 4.89  ? CATARACT EXTRACTION W/PHACO Right 06/04/2017  ? Procedure: CATARACT EXTRACTION PHACO AND INTRAOCULAR LENS PLACEMENT (IOC);  Surgeon: HTonny Branch MD;  Location: [facility];  Service: Ophthalmology;  Laterality: Right;  CDE: 4.12  ? CHOLECYSTECTOMY  09/29/2011  ? Procedure: LAPAROSCOPIC CHOLECYSTECTOMY;  Surgeon: MJamesetta So MD;  Location: [facility];  Service: General;  Laterality: N/A;  ? COLONOSCOPY  08/2009  ? Dr. HBenson Norway sessile polyp (benign lymphoid), large hemorrhoids, repeat 5-10 years  ? COLONOSCOPY N/A 06/12/2016  ? prominent hemorrhoids  ? COLONOSCOPY WITH PROPOFOL N/A 06/16/2021  ? Procedure: COLONOSCOPY WITH PROPOFOL;  Surgeon: RDaneil Dolin MD;  [facility];  Service: Endoscopy;  Laterality: N/A;  9:30am (dialysis pt)  ? ESOPHAGOGASTRODUODENOSCOPY  09/05/2011  ? RXBM:[id number] hernia; remainder of exam normal. No explanation for patient's abdominal pain with today's examination  ? ESOPHAGOGASTRODUODENOSCOPY N/A 12/17/2013  ? Dr. RGala Romney gastric erythema, erosion, mild chronic inflammation on path   ?  ESOPHAGOGASTRODUODENOSCOPY (EGD) WITH PROPOFOL N/A 06/16/2021  ? Procedure: ESOPHAGOGASTRODUODENOSCOPY (EGD) WITH PROPOFOL;  Surgeon: Daneil Dolin, MD;  [facility];  Service: Endoscopy;  Laterality: N/A;  ? EXCISION OF BREAST BIOPSY Right 10/12/2020  ? Procedure: EXCISION OF RIGHT BREAST BIOPSY;  Surgeon: Aviva Signs, MD;  Location: [facility];  Service: General;  Laterality: Right;  ? LAPAROSCOPIC APPENDECTOMY  09/29/2011  ? Procedure: APPENDECTOMY LAPAROSCOPIC;  Surgeon: Jamesetta So, MD;  [facility];  Service: General;;  incidental appendectomy  ? LEFT HEART CATH AND CORONARY ANGIOGRAPHY N/A 02/28/2019  ? Procedure: LEFT HEART CATH AND CORONARY ANGIOGRAPHY;  Surgeon: Jettie Booze, MD;  Location: [place] [facility];  Service: Cardiovascular;  Laterality: N/A;  ? MASTECTOMY MODIFIED RADICAL Right 02/18/2020  ? Procedure: MASTECTOMY MODIFIED RADICAL;  Surgeon: Aviva Signs, MD;  Location: [facility];  Service: General;  Laterality: Right;  ? MASTECTOMY, PARTIAL Right 07/13/2020  ? Procedure: RIGHT PARTIAL MASTECTOMY;  Surgeon: Aviva Signs, MD;  Location: [facility];  Service: General;  Laterality: Right;  ? PARTIAL MASTECTOMY WITH NEEDLE LOCALIZATION AND AXILLARY SENTINEL LYMPH NODE BX Right 09/18/2018  ? Procedure: RIGHT PARTIAL MASTECTOMY AFTER NEEDLE LOCALIZATION, SENTINEL LYMPH NODE BIOPSY RIGHT AXILLA;  Surgeon: Aviva Signs, MD;  Location: [facility];  Service: General;  Laterality: Right;  ? POLYPECTOMY  06/16/2021  ? Procedure: POLYPECTOMY;  Surgeon: Daneil Dolin, MD;  [facility];  Service: Endoscopy;;  ? SIMPLE MASTECTOMY WITH AXILLARY SENTINEL NODE BIOPSY Left 06/15/2020  ? Procedure: LEFT SIMPLE MASTECTOMY;  Surgeon: Aviva Signs, MD;  Location: [facility];  Service: General;  Laterality: Left;  ? ?Current Outpatient Medications on File Prior to Visit  ?Medication Sig Dispense Refill  ? acetaminophen (TYLENOL) 500 MG tablet Take 500 mg by mouth every 8 (eight) hours as needed.    ? amLODipine (NORVASC) 10 MG tablet TAKE 1 TABLET BY MOUTH AT BEDTIME 90 tablet 3  ? anastrozole (ARIMIDEX) 1 MG tablet TAKE 1 TABLET(1 MG) BY MOUTH DAILY 30 tablet 4  ? anastrozole (ARIMIDEX) 1 MG tablet TAKE 1 TABLET(1 MG) BY MOUTH DAILY 30 tablet 4  ? aspirin EC 81 MG tablet Take 81 mg by mouth daily.    ? atorvastatin (LIPITOR) 20 MG tablet TAKE 1 TABLET DAILY (NEEDS OFFICE VISIT) 90 tablet 3  ? B Complex-C-Folic Acid (DIALYVITE 166) 0.8 MG TABS Take 1 tablet by mouth daily.    ? cinacalcet (SENSIPAR) 30 MG  tablet Take 2 tablets (60 mg total) by mouth daily. 90 tablet 0  ? Darbepoetin Alfa (ARANESP, ALBUMIN FREE, IJ) Take once a week with dialysis unsure of dose    ? DULoxetine (CYMBALTA) 30 MG capsule TAKE 1 CAPSULE(30 MG) BY MOUTH DAILY 90 capsule 1  ? gabapentin (NEURONTIN) 300 MG capsule TAKE 1 CAPSULE(300 MG) BY MOUTH AT BEDTIME 90 capsule 1  ? HYDROcodone-acetaminophen (NORCO/VICODIN) 5-325 MG tablet Take 1 tablet by mouth every 6 (six) hours as needed. 20 tablet 0  ? hydrocortisone (ANUSOL-HC) 25 MG suppository Place 1 suppository (25 mg total) rectally 2 (two) times daily. For 7 days 14 suppository 0  ? lanthanum (FOSRENOL) 1000 MG chewable tablet Chew 2,000-3,000 mg by mouth See admin instructions. Take 3 tablets (3000 mg) by mouth with meals and take 2 tablets (2000 mg) with snacks    ? lidocaine-prilocaine (EMLA) cream Apply 1 application topically every Monday, Wednesday, and Friday with hemodialysis.    ? pantoprazole (PROTONIX) 40 MG tablet TAKE 1 TABLET(40 MG)  BY MOUTH DAILY 90 tablet 3  ? VITAMIN D PO Take by mouth.    ? ?No current facility-administered medications on file prior to visit.  ? ?No Known Allergies ? ? ?Review of Systems  ?All other systems reviewed and are negative. ? ?   ?Objective:  ? Physical Exam ?Vitals reviewed.  ?Constitutional:   ?   Appearance: Normal appearance.  ?Cardiovascular:  ?   Rate and Rhythm: Normal rate and regular rhythm.  ?   Pulses: Normal pulses.  ?   Heart sounds: Normal heart sounds.  ?Pulmonary:  ?   Effort: Pulmonary effort is normal.  ?   Breath sounds: Normal breath sounds.  ?Neurological:  ?   Mental Status: She is alert.  ? ? ? ? ? ?   ?Assessment & Plan:  ? ?Medication care plan discussed with patient ?Explained to the patient the rationale behind taking Lipitor due to her nonobstructive coronary artery disease, her hypertension, and her increased CV risk due to end-stage renal disease.  Also explained to the patient that there is no contraindication to  using Lipitor in patients with chronic kidney disease including end-stage.  Therefore the patient is comfortable continuing the medication. ?

## 2021-11-08 NOTE — Telephone Encounter (Signed)
Pt called and spoke to pt. Also has appt to come in next week ?

## 2021-11-08 NOTE — Telephone Encounter (Signed)
Called pt, advice pt that there is no reason for her to stop Liptor per Dr. Dennard Schaumann. Pt's husband was upset and wanted to come in to see Dr. Dennard Schaumann, appt schedule for next week 11/08/21 BT ?

## 2021-11-09 ENCOUNTER — Ambulatory Visit (INDEPENDENT_AMBULATORY_CARE_PROVIDER_SITE_OTHER): Payer: Medicare Other | Admitting: Vascular Surgery

## 2021-11-09 ENCOUNTER — Encounter: Payer: Self-pay | Admitting: Vascular Surgery

## 2021-11-09 VITALS — BP 127/75 | HR 90 | Ht 62.0 in | Wt 183.4 lb

## 2021-11-09 DIAGNOSIS — Z992 Dependence on renal dialysis: Secondary | ICD-10-CM | POA: Diagnosis not present

## 2021-11-09 DIAGNOSIS — D631 Anemia in chronic kidney disease: Secondary | ICD-10-CM | POA: Diagnosis not present

## 2021-11-09 DIAGNOSIS — E1129 Type 2 diabetes mellitus with other diabetic kidney complication: Secondary | ICD-10-CM | POA: Diagnosis not present

## 2021-11-09 DIAGNOSIS — D509 Iron deficiency anemia, unspecified: Secondary | ICD-10-CM | POA: Diagnosis not present

## 2021-11-09 DIAGNOSIS — N2581 Secondary hyperparathyroidism of renal origin: Secondary | ICD-10-CM | POA: Diagnosis not present

## 2021-11-09 DIAGNOSIS — N186 End stage renal disease: Secondary | ICD-10-CM | POA: Diagnosis not present

## 2021-11-09 NOTE — Progress Notes (Signed)
? ? ?[facility] ? ?Patient name: Megan [name] MRN: [id number] DOB: 1961-05-03 Sex: female ? ?REASON FOR CONSULT: Evaluation swelling left arm ? ?HPI: ?Megan [name] is a 61 y.o. female, who is here today for evaluation of swelling in her left arm.  She is here today with her husband.  She reports she had placement of a left radiocephalic fistula proximally 5 years ago in [place].  She has had good use of this.  By her results from [facility], she has had interventions on this with central stenosis in the past.  She recently presented with painful swelling in her entire left arm.  She underwent outpatient fistulogram with Dr.Lin on 10/13/2021.  I have the report of this procedure but did not have the images.  She was found to have no evidence of central stenosis or occlusion on the left arm.  There was no evidence of significant stenosis in her fistula. ? ?Past Medical History:  ?Diagnosis Date  ? (HFpEF) heart failure with preserved ejection fraction ([place])   ? a. 01/2019 Echo: EF 55-60%, mild conc LVH. DD.  Torn MV chordae.  ? Anemia   ? Atypical chest pain   ? a. 08/2018 MV: EF 59%, no ischemia; b. 02/2019 Cath: nonobs dzs.  ? Blood transfusion without reported diagnosis   ? Breast cancer ([place]) 10/12/2020  ? Cataract   ? ESRD (end stage renal disease) on dialysis [facility])   ? a. HD T, T, S  ? Essential hypertension, benign   ? GERD (gastroesophageal reflux disease)   ? Headache   ? Hemorrhoids   ? Mixed hyperlipidemia   ? Morbid obesity ([place])   ? Non-obstructive CAD (coronary artery disease)   ? a. 02/2019 CathL LM nl, LAD 8m LCX nl, RCA 25p, EF 55-65%.  ? PONV (postoperative nausea and vomiting)   ? S/P colonoscopy Jan 2011  ? Dr. HBenson Norway sessile polyp (benign lymphoid), large hemorrhoids, repeat 5-10 years  ? Temporal arteritis ([place]   ? Type 2 diabetes mellitus ([place]   ? Wears glasses   ? ? ?Family History  ?Problem Relation Age  of Onset  ? Hypertension Mother   ? Coronary artery disease Mother   ? Diabetes Mother   ? Hypertension Sister   ? Coronary artery disease Sister   ? Hypertension Brother   ? Heart attack Father   ? Hypertension Son   ? Heart attack Maternal Aunt   ? Hypertension Maternal Aunt   ? Diabetes Maternal Aunt   ? Heart attack Maternal Uncle   ? Hypertension Maternal Uncle   ? Diabetes Maternal Uncle   ? Heart attack Paternal Aunt   ? Hypertension Paternal Aunt   ? Diabetes Paternal Aunt   ? Heart attack Paternal Uncle   ? Hypertension Paternal Uncle   ? Diabetes Paternal Uncle   ? Heart attack Maternal Grandmother   ? Heart attack Maternal Grandfather   ? Heart attack Paternal Grandmother   ? Heart attack Paternal Grandfather   ? Colon cancer Neg Hx   ? ? ?SOCIAL HISTORY: ?Social History  ? ?Socioeconomic History  ? Marital status: Married  ?  Spouse name: Not on file  ? Number of children: Not on file  ? Years of education: Not on file  ? Highest education level: Not on file  ?Occupational History  ? Occupation: FAdvertising copywriter ?  Employer: [employer] # [id number] ?Tobacco Use  ? Smoking status: Never  ?  Smokeless tobacco: Never  ?Vaping Use  ? Vaping Use: Never used  ?Substance and Sexual Activity  ? Alcohol use: No  ? Drug use: No  ? Sexual activity: Yes  ?  Birth control/protection: Surgical  ?Other Topics Concern  ? Not on file  ?Social History Narrative  ? Works at [employer] in [place].   ? 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  ? Also has to get items from high shelves-causes achy pain in shoulder area  ?   ? Married.  ? Children are grown, out of house.  ? ?Social Determinants of Health  ? ?Financial Resource Strain: Low Risk   ? Difficulty of Paying Living Expenses: Not hard at all  ?Food Insecurity: No Food Insecurity  ? Worried About Charity fundraiser in the Last Year: Never true  ? Ran Out of Food in the Last Year: Never true  ?Transportation Needs: No Transportation Needs  ? Lack of Transportation  (Medical): No  ? Lack of Transportation (Non-Medical): No  ?Physical Activity: Inactive  ? Days of Exercise per Week: 0 days  ? Minutes of Exercise per Session: 0 min  ?Stress: No Stress Concern Present  ? Feeling of Stress : Not at all  ?Social Connections: Socially Integrated  ? Frequency of Communication with Friends and Family: More than three times a week  ? Frequency of Social Gatherings with Friends and Family: More than three times a week  ? Attends Religious Services: More than 4 times per year  ? Active Member of Clubs or Organizations: Yes  ? Attends Archivist Meetings: More than 4 times per year  ? Marital Status: Married  ?Intimate Partner Violence: Not At Risk  ? Fear of Current or Ex-Partner: No  ? Emotionally Abused: No  ? Physically Abused: No  ? Sexually Abused: No  ? ? ?No Known Allergies ? ?Current Outpatient Medications  ?Medication Sig Dispense Refill  ? acetaminophen (TYLENOL) 500 MG tablet Take 500 mg by mouth every 8 (eight) hours as needed.    ? amLODipine (NORVASC) 10 MG tablet TAKE 1 TABLET BY MOUTH AT BEDTIME 90 tablet 3  ? anastrozole (ARIMIDEX) 1 MG tablet TAKE 1 TABLET(1 MG) BY MOUTH DAILY 30 tablet 4  ? aspirin EC 81 MG tablet Take 81 mg by mouth daily.    ? atorvastatin (LIPITOR) 20 MG tablet TAKE 1 TABLET DAILY (NEEDS OFFICE VISIT) 90 tablet 3  ? B Complex-C-Folic Acid (DIALYVITE 539) 0.8 MG TABS Take 1 tablet by mouth daily.    ? cinacalcet (SENSIPAR) 30 MG tablet Take 2 tablets (60 mg total) by mouth daily. (Patient taking differently: Take 30 mg by mouth daily.) 90 tablet 0  ? Darbepoetin Alfa (ARANESP, ALBUMIN FREE, IJ) Take once a week with dialysis unsure of dose    ? DULoxetine (CYMBALTA) 30 MG capsule TAKE 1 CAPSULE(30 MG) BY MOUTH DAILY 90 capsule 1  ? gabapentin (NEURONTIN) 300 MG capsule TAKE 1 CAPSULE(300 MG) BY MOUTH AT BEDTIME 90 capsule 1  ? HYDROcodone-acetaminophen (NORCO/VICODIN) 5-325 MG tablet Take 1 tablet by mouth every 6 (six) hours as needed. 20  tablet 0  ? lanthanum (FOSRENOL) 1000 MG chewable tablet Chew 2,000-3,000 mg by mouth See admin instructions. Take 3 tablets (3000 mg) by mouth with meals and take 2 tablets (2000 mg) with snacks    ? lidocaine-prilocaine (EMLA) cream Apply 1 application topically every Monday, Wednesday, and Friday with hemodialysis.    ? meclizine (  ANTIVERT) 25 MG tablet Take 1 tablet (25 mg total) by mouth 3 (three) times daily as needed for dizziness. 30 tablet 0  ? nebivolol (BYSTOLIC) 10 MG tablet Take 1 tablet (10 mg total) by mouth daily. (Patient taking differently: Take 10 mg by mouth in the morning and at bedtime.) 90 tablet 3  ? pantoprazole (PROTONIX) 40 MG tablet TAKE 1 TABLET(40 MG) BY MOUTH DAILY 90 tablet 3  ? VITAMIN D PO Take by mouth.    ? ?No current facility-administered medications for this visit.  ? ? ?REVIEW OF SYSTEMS:  ?'[X]'$  denotes positive finding, '[ ]'$  denotes negative finding ?Cardiac  Comments:  ?Chest pain or chest pressure:    ?Shortness of breath upon exertion:    ?Short of breath when lying flat:    ?Irregular heart rhythm:    ?    ?Vascular    ?Pain in calf, thigh, or hip brought on by ambulation:    ?Pain in feet at night that wakes you up from your sleep:     ?Blood clot in your veins:    ?Leg swelling:     ?    ?Pulmonary    ?Oxygen at home:    ?Productive cough:     ?Wheezing:     ?    ?Neurologic    ?Sudden weakness in arms or legs:     ?Sudden numbness in arms or legs:     ?Sudden onset of difficulty speaking or slurred speech:    ?Temporary loss of vision in one eye:     ?Problems with dizziness:  x   ?    ?Gastrointestinal    ?Blood in stool:     ?Vomited blood:     ?    ?Genitourinary    ?Burning when urinating:     ?Blood in urine:    ?    ?Psychiatric    ?Major depression:     ?    ?Hematologic    ?Bleeding problems:    ?Problems with blood clotting too easily:    ?    ?Skin    ?Rashes or ulcers:    ?    ?Constitutional    ?Fever or chills:    ? ? ?PHYSICAL EXAM: ?Vitals:  ? 11/09/21  1322  ?BP: 127/75  ?Pulse: 90  ?Weight: 183 lb 6.4 oz (83.2 kg)  ?Height: '5\' 2"'$  (1.575 m)  ? ? ?GENERAL: The patient is a well-nourished female, in no acute distress. The vital signs are documented above. ?CAR

## 2021-11-11 DIAGNOSIS — N2581 Secondary hyperparathyroidism of renal origin: Secondary | ICD-10-CM | POA: Diagnosis not present

## 2021-11-11 DIAGNOSIS — D631 Anemia in chronic kidney disease: Secondary | ICD-10-CM | POA: Diagnosis not present

## 2021-11-11 DIAGNOSIS — N186 End stage renal disease: Secondary | ICD-10-CM | POA: Diagnosis not present

## 2021-11-11 DIAGNOSIS — D509 Iron deficiency anemia, unspecified: Secondary | ICD-10-CM | POA: Diagnosis not present

## 2021-11-11 DIAGNOSIS — Z992 Dependence on renal dialysis: Secondary | ICD-10-CM | POA: Diagnosis not present

## 2021-11-11 DIAGNOSIS — E1129 Type 2 diabetes mellitus with other diabetic kidney complication: Secondary | ICD-10-CM | POA: Diagnosis not present

## 2021-11-12 DIAGNOSIS — Z992 Dependence on renal dialysis: Secondary | ICD-10-CM | POA: Diagnosis not present

## 2021-11-12 DIAGNOSIS — N186 End stage renal disease: Secondary | ICD-10-CM | POA: Diagnosis not present

## 2021-11-12 DIAGNOSIS — N040 Nephrotic syndrome with minor glomerular abnormality: Secondary | ICD-10-CM | POA: Diagnosis not present

## 2021-11-14 DIAGNOSIS — D509 Iron deficiency anemia, unspecified: Secondary | ICD-10-CM | POA: Diagnosis not present

## 2021-11-14 DIAGNOSIS — N186 End stage renal disease: Secondary | ICD-10-CM | POA: Diagnosis not present

## 2021-11-14 DIAGNOSIS — D631 Anemia in chronic kidney disease: Secondary | ICD-10-CM | POA: Diagnosis not present

## 2021-11-14 DIAGNOSIS — Z992 Dependence on renal dialysis: Secondary | ICD-10-CM | POA: Diagnosis not present

## 2021-11-14 DIAGNOSIS — E1129 Type 2 diabetes mellitus with other diabetic kidney complication: Secondary | ICD-10-CM | POA: Diagnosis not present

## 2021-11-14 DIAGNOSIS — N2581 Secondary hyperparathyroidism of renal origin: Secondary | ICD-10-CM | POA: Diagnosis not present

## 2021-11-14 DIAGNOSIS — E119 Type 2 diabetes mellitus without complications: Secondary | ICD-10-CM | POA: Diagnosis not present

## 2021-11-15 DIAGNOSIS — Z20822 Contact with and (suspected) exposure to covid-19: Secondary | ICD-10-CM | POA: Diagnosis not present

## 2021-11-16 DIAGNOSIS — D509 Iron deficiency anemia, unspecified: Secondary | ICD-10-CM | POA: Diagnosis not present

## 2021-11-16 DIAGNOSIS — N186 End stage renal disease: Secondary | ICD-10-CM | POA: Diagnosis not present

## 2021-11-16 DIAGNOSIS — E1129 Type 2 diabetes mellitus with other diabetic kidney complication: Secondary | ICD-10-CM | POA: Diagnosis not present

## 2021-11-16 DIAGNOSIS — D631 Anemia in chronic kidney disease: Secondary | ICD-10-CM | POA: Diagnosis not present

## 2021-11-16 DIAGNOSIS — N2581 Secondary hyperparathyroidism of renal origin: Secondary | ICD-10-CM | POA: Diagnosis not present

## 2021-11-16 DIAGNOSIS — Z992 Dependence on renal dialysis: Secondary | ICD-10-CM | POA: Diagnosis not present

## 2021-11-17 ENCOUNTER — Emergency Department (HOSPITAL_COMMUNITY)
Admission: EM | Admit: 2021-11-17 | Discharge: 2021-11-17 | Disposition: A | Discharge status: Home / Self Care | Payer: Medicare Other | Attending: Emergency Medicine | Admitting: Emergency Medicine

## 2021-11-17 ENCOUNTER — Other Ambulatory Visit: Payer: Self-pay

## 2021-11-17 ENCOUNTER — Emergency Department (HOSPITAL_COMMUNITY): Payer: Medicare Other

## 2021-11-17 ENCOUNTER — Encounter (HOSPITAL_COMMUNITY): Payer: Self-pay

## 2021-11-17 ENCOUNTER — Other Ambulatory Visit (INDEPENDENT_AMBULATORY_CARE_PROVIDER_SITE_OTHER): Payer: Medicare Other | Admitting: General Surgery

## 2021-11-17 DIAGNOSIS — Z7982 Long term (current) use of aspirin: Secondary | ICD-10-CM | POA: Diagnosis not present

## 2021-11-17 DIAGNOSIS — R079 Chest pain, unspecified: Secondary | ICD-10-CM | POA: Diagnosis not present

## 2021-11-17 DIAGNOSIS — I120 Hypertensive chronic kidney disease with stage 5 chronic kidney disease or end stage renal disease: Secondary | ICD-10-CM | POA: Diagnosis not present

## 2021-11-17 DIAGNOSIS — R0789 Other chest pain: Secondary | ICD-10-CM | POA: Insufficient documentation

## 2021-11-17 DIAGNOSIS — Z09 Encounter for follow-up examination after completed treatment for conditions other than malignant neoplasm: Secondary | ICD-10-CM

## 2021-11-17 DIAGNOSIS — E1122 Type 2 diabetes mellitus with diabetic chronic kidney disease: Secondary | ICD-10-CM | POA: Diagnosis not present

## 2021-11-17 DIAGNOSIS — N186 End stage renal disease: Secondary | ICD-10-CM | POA: Diagnosis not present

## 2021-11-17 DIAGNOSIS — R42 Dizziness and giddiness: Secondary | ICD-10-CM | POA: Insufficient documentation

## 2021-11-17 DIAGNOSIS — Z79899 Other long term (current) drug therapy: Secondary | ICD-10-CM | POA: Insufficient documentation

## 2021-11-17 DIAGNOSIS — I2510 Atherosclerotic heart disease of native coronary artery without angina pectoris: Secondary | ICD-10-CM | POA: Diagnosis not present

## 2021-11-17 DIAGNOSIS — Z992 Dependence on renal dialysis: Secondary | ICD-10-CM | POA: Insufficient documentation

## 2021-11-17 DIAGNOSIS — H814 Vertigo of central origin: Secondary | ICD-10-CM | POA: Diagnosis not present

## 2021-11-17 LAB — BASIC METABOLIC PANEL
Anion gap: 15 (ref 5–15)
BUN: 44 mg/dL — ABNORMAL HIGH (ref 6–20)
CO2: 27 mmol/L (ref 22–32)
Calcium: 10 mg/dL (ref 8.9–10.3)
Chloride: 99 mmol/L (ref 98–111)
Creatinine, Ser: 9.96 mg/dL — ABNORMAL HIGH (ref 0.44–1.00)
GFR, Estimated: 4 mL/min — ABNORMAL LOW (ref >60)
Glucose, Bld: 87 mg/dL (ref 70–99)
Potassium: 3.5 mmol/L (ref 3.5–5.1)
Sodium: 141 mmol/L (ref 135–145)

## 2021-11-17 LAB — CBC
HCT: 33.2 % — ABNORMAL LOW (ref 36.0–46.0)
Hemoglobin: 10.9 g/dL — ABNORMAL LOW (ref 12.0–15.0)
MCH: 33.9 pg (ref 26.0–34.0)
MCHC: 32.8 g/dL (ref 30.0–36.0)
MCV: 103.1 fL — ABNORMAL HIGH (ref 80.0–100.0)
Platelets: 229 10*3/uL (ref 150–400)
RBC: 3.22 MIL/uL — ABNORMAL LOW (ref 3.87–5.11)
RDW: 14.2 % (ref 11.5–15.5)
WBC: 7.4 10*3/uL (ref 4.0–10.5)
nRBC: 0 % (ref 0.0–0.2)

## 2021-11-17 LAB — TROPONIN I (HIGH SENSITIVITY)
Troponin I (High Sensitivity): 11 ng/L (ref <18)
Troponin I (High Sensitivity): 10 ng/L (ref <18)

## 2021-11-17 MED ORDER — ASPIRIN 81 MG PO CHEW
324.0000 mg | CHEWABLE_TABLET | Freq: Once | ORAL | Status: AC
  Administered 2021-11-17: 324 mg via ORAL
  Filled 2021-11-17: qty 4

## 2021-11-17 MED ORDER — MECLIZINE HCL 12.5 MG PO TABS
25.0000 mg | ORAL_TABLET | Freq: Once | ORAL | Status: AC
Start: 2021-11-17 — End: 2021-11-17
  Administered 2021-11-17: 25 mg via ORAL
  Filled 2021-11-17: qty 2

## 2021-11-17 MED ORDER — HYDROCODONE-ACETAMINOPHEN 5-325 MG PO TABS: 1.0000 | ORAL_TABLET | Freq: Four times a day (QID) | ORAL | 0 refills | Status: DC | PRN

## 2021-11-17 NOTE — ED Triage Notes (Signed)
Patient with complaints of chest pain and felt a fluttering sensation with shortness of breath and dizziness.  ?

## 2021-11-17 NOTE — Discharge Instructions (Signed)
If you develop recurrent, continued, or worsening chest pain, shortness of breath, fever, vomiting, abdominal or back pain, or any other new/concerning symptoms then return to the ER for evaluation.  

## 2021-11-17 NOTE — ED Provider Notes (Signed)
?[facility] ?Provider Note ? ? ?CSN: [id number] ?Arrival date & time: 11/17/21  1015 ? ?  ? ?History ? ?Chief Complaint  ?Patient presents with  ? Chest Pain  ? ? ?Megan [name] is a 61 y.o. female. ? ?HPI ?61 year old female with a history of ESRD on dialysis, non-obstructive CAD, HTN, type 2 diabetes and other conditions presents with chief complaint of chest tightness.  This started this morning and has been on and off since waking up at around 9:30 AM.  She notes that she also is having off balance/vertigo issues since 9:30 AM as well.  Has a recurrent and chronic headache.  She gets vertigo and takes Antivert for this every once in a while, most recently about 4 days ago.  She has been having about 3 episodes of diarrhea per day for the last 4 days as well though this is not as bad as when she had C. difficile.  Some shortness of breath.  The tightness/pressure in her chest seems to come and go.  She did not take any Antivert this morning.  She last had dialysis yesterday.  She was able to walk with her husband's assistance today. ? ?Home Medications ?Prior to Admission medications   ?Medication Sig Start Date End Date Taking? Authorizing Provider  ?amLODipine (NORVASC) 10 MG tablet TAKE 1 TABLET BY MOUTH AT BEDTIME 11/25/20   [name], MD  ?anastrozole (ARIMIDEX) 1 MG tablet TAKE 1 TABLET(1 MG) BY MOUTH DAILY 08/19/21   Derek Jack, MD  ?aspirin EC 81 MG tablet Take 81 mg by mouth daily.    [provider]  ?atorvastatin (LIPITOR) 20 MG tablet TAKE 1 TABLET DAILY (NEEDS OFFICE VISIT) 06/13/21   [name], MD  ?B Complex-C-Folic Acid (DIALYVITE 379) 0.8 MG TABS Take 1 tablet by mouth daily. 01/22/18   [provider]  ?cinacalcet (SENSIPAR) 30 MG tablet Take 2 tablets (60 mg total) by mouth daily. ?Patient taking differently: Take 30 mg by mouth daily. 06/13/18   [name], MD  ?Darbepoetin Alfa (ARANESP, ALBUMIN FREE, IJ) Take once a week  with dialysis unsure of dose 07/01/21 06/30/22  [provider]  ?DULoxetine (CYMBALTA) 30 MG capsule TAKE 1 CAPSULE(30 MG) BY MOUTH DAILY 08/19/21   [name], MD  ?gabapentin (NEURONTIN) 300 MG capsule TAKE 1 CAPSULE(300 MG) BY MOUTH AT BEDTIME 07/04/21   [name], MD  ?HYDROcodone-acetaminophen (NORCO/VICODIN) 5-325 MG tablet Take 1 tablet by mouth every 6 (six) hours as needed. 11/17/21   Aviva Signs, MD  ?lanthanum (FOSRENOL) 1000 MG chewable tablet Chew 2,000-3,000 mg by mouth See admin instructions. Take 3 tablets (3000 mg) by mouth with meals and take 2 tablets (2000 mg) with snacks 10/16/19   [provider]  ?lidocaine-prilocaine (EMLA) cream Apply 1 application topically every Monday, Wednesday, and Friday with hemodialysis. 09/24/18   [provider]  ?meclizine (ANTIVERT) 25 MG tablet Take 1 tablet (25 mg total) by mouth 3 (three) times daily as needed for dizziness. 11/08/21   [name], MD  ?nebivolol (BYSTOLIC) 10 MG tablet Take 1 tablet (10 mg total) by mouth daily. ?Patient taking differently: Take 10 mg by mouth in the morning and at bedtime. 11/08/21   [name], MD  ?pantoprazole (PROTONIX) 40 MG tablet TAKE 1 TABLET(40 MG) BY MOUTH DAILY 11/25/20   [name], MD  ?VITAMIN D PO Take by mouth. 08/24/21 08/23/22  [provider]  ?   ? ?  Allergies    ?Patient has no known allergies.   ? ?Review of Systems   ?Review of Systems  ?Constitutional:  Negative for fever.  ?Respiratory:  Positive for chest tightness and shortness of breath. Negative for cough.   ?Cardiovascular:  Positive for chest pain.  ?Gastrointestinal:  Positive for diarrhea.  ?Musculoskeletal:  Positive for gait problem.  ?Neurological:  Positive for dizziness and headaches. Negative for weakness and numbness.  ? ?Physical Exam ?Updated Vital Signs ?BP (!) 170/93   Pulse 75   Temp 98.4 ?F (36.9 ?C) (Oral)   Resp 18   Ht '5\' 2"'$  (1.575 m)   Wt 83.5 kg   SpO2 98%    BMI 33.65 kg/m?  ?Physical Exam ?Vitals and nursing note reviewed.  ?Constitutional:   ?   Appearance: She is well-developed.  ?HENT:  ?   Head: Normocephalic and atraumatic.  ?Eyes:  ?   Extraocular Movements: Extraocular movements intact.  ?   Pupils: Pupils are equal, round, and reactive to light.  ?   Comments: +nystagmus  ?Cardiovascular:  ?   Rate and Rhythm: Normal rate and regular rhythm.  ?   Heart sounds: Normal heart sounds.  ?Pulmonary:  ?   Effort: Pulmonary effort is normal.  ?   Breath sounds: Normal breath sounds.  ?Abdominal:  ?   Palpations: Abdomen is soft.  ?   Tenderness: There is no abdominal tenderness.  ?Skin: ?   General: Skin is warm and dry.  ?Neurological:  ?   Mental Status: She is alert.  ?   Comments: CN 3-12 grossly intact. 5/5 strength in all 4 extremities. Grossly normal sensation. Normal finger to nose.   ? ? ?ED Results / Procedures / Treatments   ?Labs ?(all labs ordered are listed, but only abnormal results are displayed) ?Labs Reviewed  ?BASIC METABOLIC PANEL - Abnormal; Notable for the following components:  ?    Result Value  ? BUN 44 (*)   ? Creatinine, Ser 9.96 (*)   ? GFR, Estimated 4 (*)   ? All other components within normal limits  ?CBC - Abnormal; Notable for the following components:  ? RBC 3.22 (*)   ? Hemoglobin 10.9 (*)   ? HCT 33.2 (*)   ? MCV 103.1 (*)   ? All other components within normal limits  ?TROPONIN I (HIGH SENSITIVITY)  ?TROPONIN I (HIGH SENSITIVITY)  ? ? ?EKG ?EKG Interpretation ? ?Date/Time:  Thursday November 17 2021 10:22:42 EDT ?Ventricular Rate:  84 ?PR Interval:  154 ?QRS Duration: 87 ?QT Interval:  416 ?QTC Calculation: 492 ?R Axis:   3 ?Text Interpretation: Sinus rhythm Consider left ventricular hypertrophy Borderline prolonged QT interval similar to 2022 Confirmed by Sherwood Gambler [phone]) on 11/17/2021 10:41:20 AM ? ?Radiology ?DG Chest Port 1 View ? ?Result Date: 11/17/2021 ?CLINICAL DATA:  Chest pain. EXAM: PORTABLE CHEST 1 VIEW COMPARISON:   August 26, 2021. FINDINGS: The heart size and mediastinal contours are within normal limits. Both lungs are clear. The visualized skeletal structures are unremarkable. IMPRESSION: No active disease. Electronically Signed   By: Marijo Conception M.D.   On: 11/17/2021 11:19   ? ?Procedures ?Procedures  ? ? ?Medications Ordered in ED ?Medications  ?aspirin chewable tablet 324 mg (324 mg Oral Given 11/17/21 1124)  ?meclizine (ANTIVERT) tablet 25 mg (25 mg Oral Given 11/17/21 1124)  ? ? ?ED Course/ Medical Decision Making/ A&P ?  ?                        ?  Medical Decision Making ?Amount and/or Complexity of Data Reviewed ?Labs: ordered. ?Radiology: ordered. ? ?Risk ?OTC drugs. ? ? ?Patient with on and off chest tightness since this morning.  ECG without acute ischemia on my interpretation.  Troponins are negative x2.  Lab work shows chronic kidney disease but no electrolyte disturbance.  Chest x-ray images viewed by myself and there is no pneumonia or edema.  My suspicion that this is ACS is fairly low.  Chart review shows she has a nonobstructive CAD on 2020 heart catheterization.  I have low suspicion for PE or dissection.  Right now she has no symptoms. ? ?She also has some vertigo which is a chronic and recurrent problem.  No focal neurodeficits.  Symptoms resolved with Antivert.  She feels well and is stable for discharge home at this point.  Low suspicion for acute CNS emergency such as stroke/posterior fossa emergency.  Will discharge home with return precautions. ? ? ? ? ? ? ? ?Final Clinical Impression(s) / ED Diagnoses ?Final diagnoses:  ?Nonspecific chest pain  ?Vertigo  ? ? ?Rx / DC Orders ?ED Discharge Orders   ? ? None  ? ?  ? ? ?  ?Sherwood Gambler, MD ?11/17/21 1304 ? ?

## 2021-11-17 NOTE — Progress Notes (Signed)
Reorder Nor[place] ?

## 2021-11-18 DIAGNOSIS — N186 End stage renal disease: Secondary | ICD-10-CM | POA: Diagnosis not present

## 2021-11-18 DIAGNOSIS — D509 Iron deficiency anemia, unspecified: Secondary | ICD-10-CM | POA: Diagnosis not present

## 2021-11-18 DIAGNOSIS — D631 Anemia in chronic kidney disease: Secondary | ICD-10-CM | POA: Diagnosis not present

## 2021-11-18 DIAGNOSIS — Z20822 Contact with and (suspected) exposure to covid-19: Secondary | ICD-10-CM | POA: Diagnosis not present

## 2021-11-18 DIAGNOSIS — N2581 Secondary hyperparathyroidism of renal origin: Secondary | ICD-10-CM | POA: Diagnosis not present

## 2021-11-18 DIAGNOSIS — Z992 Dependence on renal dialysis: Secondary | ICD-10-CM | POA: Diagnosis not present

## 2021-11-18 DIAGNOSIS — E1129 Type 2 diabetes mellitus with other diabetic kidney complication: Secondary | ICD-10-CM | POA: Diagnosis not present

## 2021-11-21 ENCOUNTER — Ambulatory Visit: Payer: Medicare Other | Admitting: Cardiology

## 2021-11-21 DIAGNOSIS — E1129 Type 2 diabetes mellitus with other diabetic kidney complication: Secondary | ICD-10-CM | POA: Diagnosis not present

## 2021-11-21 DIAGNOSIS — D631 Anemia in chronic kidney disease: Secondary | ICD-10-CM | POA: Diagnosis not present

## 2021-11-21 DIAGNOSIS — N2581 Secondary hyperparathyroidism of renal origin: Secondary | ICD-10-CM | POA: Diagnosis not present

## 2021-11-21 DIAGNOSIS — N186 End stage renal disease: Secondary | ICD-10-CM | POA: Diagnosis not present

## 2021-11-21 DIAGNOSIS — Z992 Dependence on renal dialysis: Secondary | ICD-10-CM | POA: Diagnosis not present

## 2021-11-21 DIAGNOSIS — D509 Iron deficiency anemia, unspecified: Secondary | ICD-10-CM | POA: Diagnosis not present

## 2021-11-23 DIAGNOSIS — D631 Anemia in chronic kidney disease: Secondary | ICD-10-CM | POA: Diagnosis not present

## 2021-11-23 DIAGNOSIS — E1129 Type 2 diabetes mellitus with other diabetic kidney complication: Secondary | ICD-10-CM | POA: Diagnosis not present

## 2021-11-23 DIAGNOSIS — N186 End stage renal disease: Secondary | ICD-10-CM | POA: Diagnosis not present

## 2021-11-23 DIAGNOSIS — D509 Iron deficiency anemia, unspecified: Secondary | ICD-10-CM | POA: Diagnosis not present

## 2021-11-23 DIAGNOSIS — Z992 Dependence on renal dialysis: Secondary | ICD-10-CM | POA: Diagnosis not present

## 2021-11-23 DIAGNOSIS — N2581 Secondary hyperparathyroidism of renal origin: Secondary | ICD-10-CM | POA: Diagnosis not present

## 2021-11-25 DIAGNOSIS — D509 Iron deficiency anemia, unspecified: Secondary | ICD-10-CM | POA: Diagnosis not present

## 2021-11-25 DIAGNOSIS — N2581 Secondary hyperparathyroidism of renal origin: Secondary | ICD-10-CM | POA: Diagnosis not present

## 2021-11-25 DIAGNOSIS — Z992 Dependence on renal dialysis: Secondary | ICD-10-CM | POA: Diagnosis not present

## 2021-11-25 DIAGNOSIS — N186 End stage renal disease: Secondary | ICD-10-CM | POA: Diagnosis not present

## 2021-11-25 DIAGNOSIS — E1129 Type 2 diabetes mellitus with other diabetic kidney complication: Secondary | ICD-10-CM | POA: Diagnosis not present

## 2021-11-25 DIAGNOSIS — D631 Anemia in chronic kidney disease: Secondary | ICD-10-CM | POA: Diagnosis not present

## 2021-11-28 DIAGNOSIS — N2581 Secondary hyperparathyroidism of renal origin: Secondary | ICD-10-CM | POA: Diagnosis not present

## 2021-11-28 DIAGNOSIS — D509 Iron deficiency anemia, unspecified: Secondary | ICD-10-CM | POA: Diagnosis not present

## 2021-11-28 DIAGNOSIS — E1129 Type 2 diabetes mellitus with other diabetic kidney complication: Secondary | ICD-10-CM | POA: Diagnosis not present

## 2021-11-28 DIAGNOSIS — D631 Anemia in chronic kidney disease: Secondary | ICD-10-CM | POA: Diagnosis not present

## 2021-11-28 DIAGNOSIS — N186 End stage renal disease: Secondary | ICD-10-CM | POA: Diagnosis not present

## 2021-11-28 DIAGNOSIS — Z992 Dependence on renal dialysis: Secondary | ICD-10-CM | POA: Diagnosis not present

## 2021-11-30 ENCOUNTER — Encounter: Payer: Self-pay | Admitting: Cardiology

## 2021-11-30 DIAGNOSIS — E119 Type 2 diabetes mellitus without complications: Secondary | ICD-10-CM | POA: Diagnosis not present

## 2021-11-30 DIAGNOSIS — N2581 Secondary hyperparathyroidism of renal origin: Secondary | ICD-10-CM | POA: Diagnosis not present

## 2021-11-30 DIAGNOSIS — D631 Anemia in chronic kidney disease: Secondary | ICD-10-CM | POA: Diagnosis not present

## 2021-11-30 DIAGNOSIS — Z992 Dependence on renal dialysis: Secondary | ICD-10-CM | POA: Diagnosis not present

## 2021-11-30 DIAGNOSIS — E1129 Type 2 diabetes mellitus with other diabetic kidney complication: Secondary | ICD-10-CM | POA: Diagnosis not present

## 2021-11-30 DIAGNOSIS — N186 End stage renal disease: Secondary | ICD-10-CM | POA: Diagnosis not present

## 2021-11-30 DIAGNOSIS — D509 Iron deficiency anemia, unspecified: Secondary | ICD-10-CM | POA: Diagnosis not present

## 2021-11-30 DIAGNOSIS — E039 Hypothyroidism, unspecified: Secondary | ICD-10-CM | POA: Diagnosis not present

## 2021-12-02 ENCOUNTER — Other Ambulatory Visit (HOSPITAL_COMMUNITY): Payer: Self-pay

## 2021-12-02 DIAGNOSIS — D509 Iron deficiency anemia, unspecified: Secondary | ICD-10-CM | POA: Diagnosis not present

## 2021-12-02 DIAGNOSIS — Z992 Dependence on renal dialysis: Secondary | ICD-10-CM | POA: Diagnosis not present

## 2021-12-02 DIAGNOSIS — D631 Anemia in chronic kidney disease: Secondary | ICD-10-CM | POA: Diagnosis not present

## 2021-12-02 DIAGNOSIS — E1129 Type 2 diabetes mellitus with other diabetic kidney complication: Secondary | ICD-10-CM | POA: Diagnosis not present

## 2021-12-02 DIAGNOSIS — N186 End stage renal disease: Secondary | ICD-10-CM | POA: Diagnosis not present

## 2021-12-02 DIAGNOSIS — N2581 Secondary hyperparathyroidism of renal origin: Secondary | ICD-10-CM | POA: Diagnosis not present

## 2021-12-03 ENCOUNTER — Encounter (HOSPITAL_COMMUNITY): Payer: Self-pay | Admitting: Hematology

## 2021-12-05 DIAGNOSIS — D509 Iron deficiency anemia, unspecified: Secondary | ICD-10-CM | POA: Diagnosis not present

## 2021-12-05 DIAGNOSIS — D631 Anemia in chronic kidney disease: Secondary | ICD-10-CM | POA: Diagnosis not present

## 2021-12-05 DIAGNOSIS — Z992 Dependence on renal dialysis: Secondary | ICD-10-CM | POA: Diagnosis not present

## 2021-12-05 DIAGNOSIS — N186 End stage renal disease: Secondary | ICD-10-CM | POA: Diagnosis not present

## 2021-12-05 DIAGNOSIS — E1129 Type 2 diabetes mellitus with other diabetic kidney complication: Secondary | ICD-10-CM | POA: Diagnosis not present

## 2021-12-05 DIAGNOSIS — N2581 Secondary hyperparathyroidism of renal origin: Secondary | ICD-10-CM | POA: Diagnosis not present

## 2021-12-07 ENCOUNTER — Encounter: Payer: Self-pay | Admitting: Gastroenterology

## 2021-12-07 ENCOUNTER — Ambulatory Visit (INDEPENDENT_AMBULATORY_CARE_PROVIDER_SITE_OTHER): Payer: Medicare Other | Admitting: Gastroenterology

## 2021-12-07 DIAGNOSIS — Z20822 Contact with and (suspected) exposure to covid-19: Secondary | ICD-10-CM | POA: Diagnosis not present

## 2021-12-07 DIAGNOSIS — R051 Acute cough: Secondary | ICD-10-CM | POA: Diagnosis not present

## 2021-12-07 DIAGNOSIS — E1129 Type 2 diabetes mellitus with other diabetic kidney complication: Secondary | ICD-10-CM | POA: Diagnosis not present

## 2021-12-07 DIAGNOSIS — N186 End stage renal disease: Secondary | ICD-10-CM | POA: Diagnosis not present

## 2021-12-07 DIAGNOSIS — D631 Anemia in chronic kidney disease: Secondary | ICD-10-CM | POA: Diagnosis not present

## 2021-12-07 DIAGNOSIS — D509 Iron deficiency anemia, unspecified: Secondary | ICD-10-CM | POA: Diagnosis not present

## 2021-12-07 DIAGNOSIS — K641 Second degree hemorrhoids: Secondary | ICD-10-CM | POA: Diagnosis not present

## 2021-12-07 DIAGNOSIS — Z992 Dependence on renal dialysis: Secondary | ICD-10-CM | POA: Diagnosis not present

## 2021-12-07 DIAGNOSIS — N2581 Secondary hyperparathyroidism of renal origin: Secondary | ICD-10-CM | POA: Diagnosis not present

## 2021-12-07 DIAGNOSIS — R059 Cough, unspecified: Secondary | ICD-10-CM | POA: Diagnosis not present

## 2021-12-07 NOTE — Progress Notes (Signed)
? ? ? ? ?  [facility] BANDING PROCEDURE NOTE ? ?Megan [name] is a 61 y.o. female presenting today for consideration of hemorrhoid banding. Recent colonoscopy with internal hemorrhoids. Last episode of significant bleeding around October/November. Feels prolapsing tissue when wipes. Feels it on right side.  ? ? ?The patient presents with symptomatic grade 2 hemorrhoids, unresponsive to maximal medical therapy, requesting rubber band ligation of her hemorrhoidal disease. All risks, benefits, and alternative forms of therapy were described and informed consent was obtained. ? ?The decision was made to band the right anterior internal hemorrhoid, and the [place] was used to perform band ligation without complication. Digital anorectal examination was then performed to assure proper positioning of the band, and to adjust the banded tissue as required. The patient was discharged home without pain or other issues. Dietary and behavioral recommendations were given, along with follow-up instructions. The patient will return in several weeks for followup and possible additional banding as required. ? ?No complications were encountered and the patient tolerated the procedure well.  ? ?Annitta Needs, PhD, ANP-BC ?[facility]  ? ?

## 2021-12-07 NOTE — Patient Instructions (Signed)
We will see you back in several weeks for further banding! ? ?Please continue to avoid straining. ? ?You should limit your toilet time to 2-3 minutes at the most.  ? ?Continue to avoid constipation. ? ?Please call me with any concerns or issues! ? ?I enjoyed seeing you again today! As you know, I value our relationship and want to provide genuine, compassionate, and quality care. I welcome your feedback. If you receive a survey regarding your visit,  I greatly appreciate you taking time to fill this out. See you next time! ? ?Annitta Needs, PhD, ANP-BC ?[facility]  ? ? ? ? ? ? ? ? ? ?

## 2021-12-09 ENCOUNTER — Encounter: Payer: Self-pay | Admitting: Cardiology

## 2021-12-09 ENCOUNTER — Ambulatory Visit (INDEPENDENT_AMBULATORY_CARE_PROVIDER_SITE_OTHER): Payer: Medicare Other | Admitting: Cardiology

## 2021-12-09 ENCOUNTER — Encounter: Payer: Self-pay | Admitting: *Deleted

## 2021-12-09 VITALS — BP 116/80 | HR 91 | Ht 61.0 in | Wt 181.4 lb

## 2021-12-09 DIAGNOSIS — R0789 Other chest pain: Secondary | ICD-10-CM

## 2021-12-09 DIAGNOSIS — D631 Anemia in chronic kidney disease: Secondary | ICD-10-CM | POA: Diagnosis not present

## 2021-12-09 DIAGNOSIS — Z992 Dependence on renal dialysis: Secondary | ICD-10-CM | POA: Diagnosis not present

## 2021-12-09 DIAGNOSIS — D509 Iron deficiency anemia, unspecified: Secondary | ICD-10-CM | POA: Diagnosis not present

## 2021-12-09 DIAGNOSIS — I10 Essential (primary) hypertension: Secondary | ICD-10-CM | POA: Diagnosis not present

## 2021-12-09 DIAGNOSIS — N186 End stage renal disease: Secondary | ICD-10-CM | POA: Diagnosis not present

## 2021-12-09 DIAGNOSIS — N2581 Secondary hyperparathyroidism of renal origin: Secondary | ICD-10-CM | POA: Diagnosis not present

## 2021-12-09 DIAGNOSIS — E1129 Type 2 diabetes mellitus with other diabetic kidney complication: Secondary | ICD-10-CM | POA: Diagnosis not present

## 2021-12-09 MED ORDER — ISOSORBIDE MONONITRATE ER 30 MG PO TB24: 15.0000 mg | ORAL_TABLET | Freq: Every day | ORAL | 6 refills | Status: DC

## 2021-12-09 NOTE — Progress Notes (Signed)
? ? ? ?Clinical Summary ?Ms. Messing is a 61 y.o.female seen today for follow up of the following medical problems.  ? ? ?1.Chest pain ?- long history of chest pain ?2018 nuclear stress: normal perfusion ?Jan 2020 nuclear stress no ischemia ?- 02/2019 cath: prox RCA 25%, mid LAD 10%, otherwise normal vessels ? ?ER visit 11/17/21 with chest pain ?- trop neg x2, EKG SR without ischemic changes ?- pressure midchest. 6/10 in severity. Can occur at rest or with activity. No other associated symptoms. Lasts few seconds. Not positional. Not related to eating. ?- similar to prior chest pains. ? ? ?3. HTN ?- compliant with meds ? ? ?4. ESRD ?Past Medical History:  ?Diagnosis Date  ? (HFpEF) heart failure with preserved ejection fraction ([place])   ? a. 01/2019 Echo: EF 55-60%, mild conc LVH. DD.  Torn MV chordae.  ? Anemia   ? Atypical chest pain   ? a. 08/2018 MV: EF 59%, no ischemia; b. 02/2019 Cath: nonobs dzs.  ? Blood transfusion without reported diagnosis   ? Breast cancer ([place]) 10/12/2020  ? Cataract   ? ESRD (end stage renal disease) on dialysis [facility])   ? a. HD T, T, S  ? Essential hypertension, benign   ? GERD (gastroesophageal reflux disease)   ? Headache   ? Hemorrhoids   ? Mixed hyperlipidemia   ? Morbid obesity ([place])   ? Non-obstructive CAD (coronary artery disease)   ? a. 02/2019 CathL LM nl, LAD 93m LCX nl, RCA 25p, EF 55-65%.  ? PONV (postoperative nausea and vomiting)   ? S/P colonoscopy Jan 2011  ? Dr. HBenson Norway sessile polyp (benign lymphoid), large hemorrhoids, repeat 5-10 years  ? Temporal arteritis ([place]   ? [place] 2 diabetes mellitus [place]   ? Wears glasses   ? ? ? ?No Known Allergies ? ? ?Current Outpatient Medications  ?Medication Sig Dispense Refill  ? amLODipine (NORVASC) 10 MG tablet TAKE 1 TABLET BY MOUTH AT BEDTIME 90 tablet 3  ? anastrozole (ARIMIDEX) 1 MG tablet TAKE 1 TABLET(1 MG) BY MOUTH DAILY 30 tablet 4  ? aspirin EC 81 MG tablet Take 81 mg by mouth daily.    ? atorvastatin (LIPITOR) 20 MG tablet  TAKE 1 TABLET DAILY (NEEDS OFFICE VISIT) 90 tablet 3  ? B Complex-C-Folic Acid (DIALYVITE 8578 0.8 MG TABS Take 1 tablet by mouth daily.    ? cinacalcet (SENSIPAR) 30 MG tablet Take 2 tablets (60 mg total) by mouth daily. (Patient taking differently: Take 30 mg by mouth daily.) 90 tablet 0  ? Darbepoetin Alfa (ARANESP, ALBUMIN FREE, IJ) Take once a week with dialysis unsure of dose    ? DULoxetine (CYMBALTA) 30 MG capsule TAKE 1 CAPSULE(30 MG) BY MOUTH DAILY 90 capsule 1  ? gabapentin (NEURONTIN) 300 MG capsule TAKE 1 CAPSULE(300 MG) BY MOUTH AT BEDTIME 90 capsule 1  ? HYDROcodone-acetaminophen (NORCO/VICODIN) 5-325 MG tablet Take 1 tablet by mouth every 6 (six) hours as needed. 20 tablet 0  ? lanthanum (FOSRENOL) 1000 MG chewable tablet Chew 2,000-3,000 mg by mouth See admin instructions. Take 3 tablets (3000 mg) by mouth with meals and take 2 tablets (2000 mg) with snacks    ? lidocaine-prilocaine (EMLA) cream Apply 1 application topically every Monday, Wednesday, and Friday with hemodialysis.    ? meclizine (ANTIVERT) 25 MG tablet Take 1 tablet (25 mg total) by mouth 3 (three) times daily as needed for dizziness. 30 tablet 0  ? nebivolol (BYSTOLIC) 10 MG tablet Take  1 tablet (10 mg total) by mouth daily. (Patient taking differently: Take 10 mg by mouth in the morning and at bedtime.) 90 tablet 3  ? pantoprazole (PROTONIX) 40 MG tablet TAKE 1 TABLET(40 MG) BY MOUTH DAILY 90 tablet 3  ? VITAMIN D PO Take by mouth.    ? ?No current facility-administered medications for this visit.  ? ? ? ?Past Surgical History:  ?Procedure Laterality Date  ? ABDOMINAL HYSTERECTOMY    ? APPENDECTOMY    ? ARTERY BIOPSY N/A 05/09/2018  ? Procedure: RIGHT TEMPORAL ARTERY BIOPSY;  Surgeon: Judeth Horn, MD;  Location: [facility];  Service: General;  Laterality: N/A;  ? BREAST BIOPSY Right 06/15/2020  ? Procedure: RIGHT BREAST BIOPSY;  Surgeon: Aviva Signs, MD;  Location[facility];  Service: General;  Laterality: Right;  ? CATARACT EXTRACTION  W/PHACO Left 02/09/2017  ? Procedure: CATARACT EXTRACTION PHACO AND INTRAOCULAR LENS PLACEMENT LEFT EYE;  Surgeon: Tonny , MD;  Location: [facility];  Service: Ophthalmology;  Laterality: Left;  CDE: 4.89  ? CATARACT EXTRACTION W/PHACO Right 06/04/2017  ? Procedure: CATARACT EXTRACTION PHACO AND INTRAOCULAR LENS PLACEMENT (IOC);  Surgeon: Tonny , MD;  Location: [facility];  Service: Ophthalmology;  Laterality: Right;  CDE: 4.12  ? CHOLECYSTECTOMY  09/29/2011  ? Procedure: LAPAROSCOPIC CHOLECYSTECTOMY;  Surgeon: Jamesetta So, MD;  Location: [facility];  Service: General;  Laterality: N/A;  ? COLONOSCOPY  08/2009  ? Dr. Benson Norway: sessile polyp (benign lymphoid), large hemorrhoids, repeat 5-10 years  ? COLONOSCOPY N/A 06/12/2016  ? prominent hemorrhoids  ? COLONOSCOPY WITH PROPOFOL N/A 06/16/2021  ? Procedure: COLONOSCOPY WITH PROPOFOL;  Surgeon: Daneil Dolin, MD;  [facility];  Service: Endoscopy;  Laterality: N/A;  9:30am (dialysis pt)  ? ESOPHAGOGASTRODUODENOSCOPY  09/05/2011  ? [id number] hiatal hernia; remainder of exam normal. No explanation for patient's abdominal pain with today's examination  ? ESOPHAGOGASTRODUODENOSCOPY N/A 12/17/2013  ? Dr. [name]: gastric erythema, erosion, mild chronic inflammation on path   ? ESOPHAGOGASTRODUODENOSCOPY (EGD) WITH PROPOFOL N/A 06/16/2021  ? Procedure: ESOPHAGOGASTRODUODENOSCOPY (EGD) WITH PROPOFOL;  Surgeon: Daneil Dolin, MD;  [facility];  Service: Endoscopy;  Laterality: N/A;  ? EXCISION OF BREAST BIOPSY Right 10/12/2020  ? Procedure: EXCISION OF RIGHT BREAST BIOPSY;  Surgeon: Aviva Signs, MD;  [facility];  Service: General;  Laterality: Right;  ? LAPAROSCOPIC APPENDECTOMY  09/29/2011  ? Procedure: APPENDECTOMY LAPAROSCOPIC;  Surgeon: Jamesetta So, MD;  [facility];  Service: General;;  incidental appendectomy  ? LEFT HEART CATH AND CORONARY ANGIOGRAPHY N/A 02/28/2019  ? Procedure: LEFT HEART CATH AND CORONARY ANGIOGRAPHY;  Surgeon:  Jettie Booze, MD;  Location: [place] [facility];  Service: Cardiovascular;  Laterality: N/A;  ? MASTECTOMY MODIFIED RADICAL Right 02/18/2020  ? Procedure: MASTECTOMY MODIFIED RADICAL;  Surgeon: Aviva Signs, MD;  [facility];  Service: General;  Laterality: Right;  ? MASTECTOMY, PARTIAL Right 07/13/2020  ? Procedure: RIGHT PARTIAL MASTECTOMY;  Surgeon: Aviva Signs, MD;  [facility];  Service: General;  Laterality: Right;  ? PARTIAL MASTECTOMY WITH NEEDLE LOCALIZATION AND AXILLARY SENTINEL LYMPH NODE BX Right 09/18/2018  ? Procedure: RIGHT PARTIAL MASTECTOMY AFTER NEEDLE LOCALIZATION, SENTINEL LYMPH NODE BIOPSY RIGHT AXILLA;  Surgeon: Aviva Signs, MD;  [facility];  Service: General;  Laterality: Right;  ? POLYPECTOMY  06/16/2021  ? Procedure: POLYPECTOMY;  Surgeon: Daneil Dolin, MD;  [facility];  Service: Endoscopy;;  ? SIMPLE MASTECTOMY WITH AXILLARY SENTINEL NODE BIOPSY  Left 06/15/2020  ? Procedure: LEFT SIMPLE MASTECTOMY;  Surgeon: Aviva Signs, MD;  [facility];  Service: General;  Laterality: Left;  ? ? ? ?No Known Allergies ? ? ? ?Family History  ?Problem Relation Age of Onset  ? Hypertension Mother   ? Coronary artery disease Mother   ? Diabetes Mother   ? Hypertension Sister   ? Coronary artery disease Sister   ? Hypertension Brother   ? Heart attack Father   ? Hypertension Son   ? Heart attack Maternal Aunt   ? Hypertension Maternal Aunt   ? Diabetes Maternal Aunt   ? Heart attack Maternal Uncle   ? Hypertension Maternal Uncle   ? Diabetes Maternal Uncle   ? Heart attack Paternal Aunt   ? Hypertension Paternal Aunt   ? Diabetes Paternal Aunt   ? Heart attack Paternal Uncle   ? Hypertension Paternal Uncle   ? Diabetes Paternal Uncle   ? Heart attack Maternal Grandmother   ? Heart attack Maternal Grandfather   ? Heart attack Paternal Grandmother   ? Heart attack Paternal Grandfather   ? Colon cancer Neg Hx   ? ? ? ?Social History ?Ms. Figge reports that  she has never smoked. She has never used smokeless tobacco. ?Ms. Gaughran reports no history of alcohol use. ? ? ?Review of Systems ?CONSTITUTIONAL: No weight loss, fever, chills, weakness or fatigue.  ?

## 2021-12-09 NOTE — Patient Instructions (Signed)
Medication Instructions:  ?Begin Imdur '15mg'$  daily  ?Continue all other medications.    ? ?Labwork: ?none ? ?Testing/Procedures: ?none ? ?Follow-Up: ?6 months  ? ?Any Other Special Instructions Will Be Listed Below (If Applicable). ? ? ?If you need a refill on your cardiac medications before your next appointment, please call your pharmacy. ? ?

## 2021-12-12 DIAGNOSIS — E119 Type 2 diabetes mellitus without complications: Secondary | ICD-10-CM | POA: Diagnosis not present

## 2021-12-12 DIAGNOSIS — N040 Nephrotic syndrome with minor glomerular abnormality: Secondary | ICD-10-CM | POA: Diagnosis not present

## 2021-12-12 DIAGNOSIS — Z992 Dependence on renal dialysis: Secondary | ICD-10-CM | POA: Diagnosis not present

## 2021-12-12 DIAGNOSIS — N2581 Secondary hyperparathyroidism of renal origin: Secondary | ICD-10-CM | POA: Diagnosis not present

## 2021-12-12 DIAGNOSIS — E1129 Type 2 diabetes mellitus with other diabetic kidney complication: Secondary | ICD-10-CM | POA: Diagnosis not present

## 2021-12-12 DIAGNOSIS — N186 End stage renal disease: Secondary | ICD-10-CM | POA: Diagnosis not present

## 2021-12-12 DIAGNOSIS — D631 Anemia in chronic kidney disease: Secondary | ICD-10-CM | POA: Diagnosis not present

## 2021-12-14 DIAGNOSIS — D631 Anemia in chronic kidney disease: Secondary | ICD-10-CM | POA: Diagnosis not present

## 2021-12-14 DIAGNOSIS — Z992 Dependence on renal dialysis: Secondary | ICD-10-CM | POA: Diagnosis not present

## 2021-12-14 DIAGNOSIS — N2581 Secondary hyperparathyroidism of renal origin: Secondary | ICD-10-CM | POA: Diagnosis not present

## 2021-12-14 DIAGNOSIS — E119 Type 2 diabetes mellitus without complications: Secondary | ICD-10-CM | POA: Diagnosis not present

## 2021-12-14 DIAGNOSIS — E1129 Type 2 diabetes mellitus with other diabetic kidney complication: Secondary | ICD-10-CM | POA: Diagnosis not present

## 2021-12-14 DIAGNOSIS — N186 End stage renal disease: Secondary | ICD-10-CM | POA: Diagnosis not present

## 2021-12-16 DIAGNOSIS — E119 Type 2 diabetes mellitus without complications: Secondary | ICD-10-CM | POA: Diagnosis not present

## 2021-12-16 DIAGNOSIS — E1129 Type 2 diabetes mellitus with other diabetic kidney complication: Secondary | ICD-10-CM | POA: Diagnosis not present

## 2021-12-16 DIAGNOSIS — N2581 Secondary hyperparathyroidism of renal origin: Secondary | ICD-10-CM | POA: Diagnosis not present

## 2021-12-16 DIAGNOSIS — D631 Anemia in chronic kidney disease: Secondary | ICD-10-CM | POA: Diagnosis not present

## 2021-12-16 DIAGNOSIS — Z992 Dependence on renal dialysis: Secondary | ICD-10-CM | POA: Diagnosis not present

## 2021-12-16 DIAGNOSIS — N186 End stage renal disease: Secondary | ICD-10-CM | POA: Diagnosis not present

## 2021-12-19 ENCOUNTER — Other Ambulatory Visit: Payer: Self-pay

## 2021-12-19 ENCOUNTER — Telehealth: Payer: Self-pay

## 2021-12-19 DIAGNOSIS — Z170 Estrogen receptor positive status [ER+]: Secondary | ICD-10-CM

## 2021-12-19 DIAGNOSIS — Z992 Dependence on renal dialysis: Secondary | ICD-10-CM | POA: Diagnosis not present

## 2021-12-19 DIAGNOSIS — K625 Hemorrhage of anus and rectum: Secondary | ICD-10-CM

## 2021-12-19 DIAGNOSIS — K641 Second degree hemorrhoids: Secondary | ICD-10-CM | POA: Diagnosis not present

## 2021-12-19 DIAGNOSIS — N2581 Secondary hyperparathyroidism of renal origin: Secondary | ICD-10-CM | POA: Diagnosis not present

## 2021-12-19 DIAGNOSIS — E119 Type 2 diabetes mellitus without complications: Secondary | ICD-10-CM | POA: Diagnosis not present

## 2021-12-19 DIAGNOSIS — N186 End stage renal disease: Secondary | ICD-10-CM | POA: Diagnosis not present

## 2021-12-19 DIAGNOSIS — Z20822 Contact with and (suspected) exposure to covid-19: Secondary | ICD-10-CM | POA: Diagnosis not present

## 2021-12-19 DIAGNOSIS — D631 Anemia in chronic kidney disease: Secondary | ICD-10-CM | POA: Diagnosis not present

## 2021-12-19 DIAGNOSIS — E1129 Type 2 diabetes mellitus with other diabetic kidney complication: Secondary | ICD-10-CM | POA: Diagnosis not present

## 2021-12-19 LAB — CBC WITH DIFFERENTIAL/PLATELET
Absolute Monocytes: 697 cells/uL (ref 200–950)
Basophils Absolute: 60 cells/uL (ref 0–200)
Basophils Relative: 0.7 %
Eosinophils Absolute: 206 cells/uL (ref 15–500)
Eosinophils Relative: 2.4 %
HCT: 29.7 % — ABNORMAL LOW (ref 35.0–45.0)
Hemoglobin: 10.1 g/dL — ABNORMAL LOW (ref 11.7–15.5)
Lymphs Abs: 1668 cells/uL (ref 850–3900)
MCH: 34.1 pg — ABNORMAL HIGH (ref 27.0–33.0)
MCHC: 34 g/dL (ref 32.0–36.0)
MCV: 100.3 fL — ABNORMAL HIGH (ref 80.0–100.0)
MPV: 10 fL (ref 7.5–12.5)
Monocytes Relative: 8.1 %
Neutro Abs: 5968 cells/uL (ref 1500–7800)
Neutrophils Relative %: 69.4 %
Platelets: 303 10*3/uL (ref 140–400)
RBC: 2.96 10*6/uL — ABNORMAL LOW (ref 3.80–5.10)
RDW: 14.1 % (ref 11.0–15.0)
Total Lymphocyte: 19.4 %
WBC: 8.6 10*3/uL (ref 3.8–10.8)

## 2021-12-19 NOTE — Telephone Encounter (Signed)
Phoned and spoke with the pt regarding instructions for blood work , ED instructions, and to continue to monitor for now. Got the pt's labs to go through. Phoned and advised the pt that she can go and have the labs done now. Pt expressed understanding ?

## 2021-12-19 NOTE — Telephone Encounter (Signed)
Pt showed up here in the office this morning after being told by the hospital to come here. Pt started having bleeding Sat morning @ 3:30 after having a BM. No bleeding yesterday, it started back this am @ 2 after a BM. The blood is bright red, a lot in the toilet bowel. She states to her it is heavy bleeding. She is also having lower back pain. Pt's was accompanied by her husband. So please call the pt they are associating this with her banding procedure. ?

## 2021-12-19 NOTE — Telephone Encounter (Signed)
We can check a CBC now. Monitor for multiple episodes of bleeding. Could be having bleeding from where the "scab" comes off. The band falls off much sooner (around 2-3 days), so this is not from the band itself coming off. ? ?It should taper off. If dizzy, light-headed, short of breath, go to ED. For now, just monitor. She has additional hemorrhoids as well still present that could be causing this bleeding as well.  ?

## 2021-12-21 DIAGNOSIS — N2581 Secondary hyperparathyroidism of renal origin: Secondary | ICD-10-CM | POA: Diagnosis not present

## 2021-12-21 DIAGNOSIS — Z992 Dependence on renal dialysis: Secondary | ICD-10-CM | POA: Diagnosis not present

## 2021-12-21 DIAGNOSIS — N186 End stage renal disease: Secondary | ICD-10-CM | POA: Diagnosis not present

## 2021-12-21 DIAGNOSIS — D631 Anemia in chronic kidney disease: Secondary | ICD-10-CM | POA: Diagnosis not present

## 2021-12-21 DIAGNOSIS — E119 Type 2 diabetes mellitus without complications: Secondary | ICD-10-CM | POA: Diagnosis not present

## 2021-12-21 DIAGNOSIS — E1129 Type 2 diabetes mellitus with other diabetic kidney complication: Secondary | ICD-10-CM | POA: Diagnosis not present

## 2021-12-22 ENCOUNTER — Encounter: Payer: Self-pay | Admitting: Gastroenterology

## 2021-12-22 ENCOUNTER — Ambulatory Visit (INDEPENDENT_AMBULATORY_CARE_PROVIDER_SITE_OTHER): Payer: Medicare Other | Admitting: Gastroenterology

## 2021-12-22 VITALS — BP 140/76 | HR 81 | Temp 98.4°F | Ht 61.0 in | Wt 182.2 lb

## 2021-12-22 DIAGNOSIS — K641 Second degree hemorrhoids: Secondary | ICD-10-CM | POA: Diagnosis not present

## 2021-12-22 NOTE — Progress Notes (Signed)
? ? ? ? ?  [place] BANDING PROCEDURE NOTE ? ?NOHA MILBERGER is a 61 y.o. female presenting today for consideration of hemorrhoid banding. Last colonoscopy with internal hemorrhoids. Has had right anterior banding. Called in with notable bleeding approximately 2 weeks after first banding. This subsided on own.  ? ?The patient presents with symptomatic grade 2 hemorrhoids, unresponsive to maximal medical therapy, requesting rubber band ligation of her hemorrhoidal disease. All risks, benefits, and alternative forms of therapy were described and informed consent was obtained. ? ?In the left lateral decubitus position, anoscopic examination revealed grade 2/3 hemorrhoids in the left lateral and right posterior position. Oozing from both sites. Prior hemorrhoid site (right anterior) well-healed, no stigmata of bleeding.  ? ?The decision was made to band the left lateral internal hemorrhoid, and the [place] was used to perform band ligation without complication. Digital anorectal examination was then performed to assure proper positioning of the band, and to adjust the banded tissue as required. The patient was discharged home without pain or other issues. Dietary and behavioral recommendations were given, along with follow-up instructions. The patient will return in several weeks for followup and possible additional banding as required. I suspect her recent notable bleeding was from the existing hemorrhoid columns. Does not appear to be post-banding bleeding.  ? ?No complications were encountered and the patient tolerated the procedure well.  ? ?Annitta Needs, PhD, ANP-BC ?[facility]  ? ?

## 2021-12-22 NOTE — Patient Instructions (Signed)
Please call if any concerns! ? ?Will see you back in 2 weeks for additional banding! ? ?I enjoyed seeing you again today! As you know, I value our relationship and want to provide genuine, compassionate, and quality care. I welcome your feedback. If you receive a survey regarding your visit,  I greatly appreciate you taking time to fill this out. See you next time! ? ?Annitta Needs, PhD, ANP-BC ?[facility]  ? ?

## 2021-12-23 DIAGNOSIS — N186 End stage renal disease: Secondary | ICD-10-CM | POA: Diagnosis not present

## 2021-12-23 DIAGNOSIS — Z992 Dependence on renal dialysis: Secondary | ICD-10-CM | POA: Diagnosis not present

## 2021-12-23 DIAGNOSIS — N2581 Secondary hyperparathyroidism of renal origin: Secondary | ICD-10-CM | POA: Diagnosis not present

## 2021-12-23 DIAGNOSIS — E1129 Type 2 diabetes mellitus with other diabetic kidney complication: Secondary | ICD-10-CM | POA: Diagnosis not present

## 2021-12-23 DIAGNOSIS — D631 Anemia in chronic kidney disease: Secondary | ICD-10-CM | POA: Diagnosis not present

## 2021-12-23 DIAGNOSIS — E119 Type 2 diabetes mellitus without complications: Secondary | ICD-10-CM | POA: Diagnosis not present

## 2021-12-26 DIAGNOSIS — E1129 Type 2 diabetes mellitus with other diabetic kidney complication: Secondary | ICD-10-CM | POA: Diagnosis not present

## 2021-12-26 DIAGNOSIS — N186 End stage renal disease: Secondary | ICD-10-CM | POA: Diagnosis not present

## 2021-12-26 DIAGNOSIS — D631 Anemia in chronic kidney disease: Secondary | ICD-10-CM | POA: Diagnosis not present

## 2021-12-26 DIAGNOSIS — Z992 Dependence on renal dialysis: Secondary | ICD-10-CM | POA: Diagnosis not present

## 2021-12-26 DIAGNOSIS — N2581 Secondary hyperparathyroidism of renal origin: Secondary | ICD-10-CM | POA: Diagnosis not present

## 2021-12-26 DIAGNOSIS — E119 Type 2 diabetes mellitus without complications: Secondary | ICD-10-CM | POA: Diagnosis not present

## 2021-12-27 NOTE — Telephone Encounter (Signed)
Pt LMOVM regarding her bleeding again. I phoned the pt back and was advised by her that it started again this morning when she got up and again about 11:30 am. Pt stated when she wiped she had blood clots. She states a good amount of bleeding like before. No complaints of being light headed or dizzy. Pt has appt with you on 5/25. Please advise ?

## 2021-12-27 NOTE — Telephone Encounter (Signed)
Let's recheck CBC again. Continue to monitor.  ?

## 2021-12-28 ENCOUNTER — Other Ambulatory Visit: Payer: Self-pay

## 2021-12-28 DIAGNOSIS — E119 Type 2 diabetes mellitus without complications: Secondary | ICD-10-CM | POA: Diagnosis not present

## 2021-12-28 DIAGNOSIS — E1129 Type 2 diabetes mellitus with other diabetic kidney complication: Secondary | ICD-10-CM | POA: Diagnosis not present

## 2021-12-28 DIAGNOSIS — K625 Hemorrhage of anus and rectum: Secondary | ICD-10-CM

## 2021-12-28 DIAGNOSIS — K641 Second degree hemorrhoids: Secondary | ICD-10-CM

## 2021-12-28 DIAGNOSIS — Z992 Dependence on renal dialysis: Secondary | ICD-10-CM | POA: Diagnosis not present

## 2021-12-28 DIAGNOSIS — N2581 Secondary hyperparathyroidism of renal origin: Secondary | ICD-10-CM | POA: Diagnosis not present

## 2021-12-28 DIAGNOSIS — N186 End stage renal disease: Secondary | ICD-10-CM | POA: Diagnosis not present

## 2021-12-28 DIAGNOSIS — D631 Anemia in chronic kidney disease: Secondary | ICD-10-CM | POA: Diagnosis not present

## 2021-12-28 NOTE — Telephone Encounter (Signed)
Phoned and spoke with the pt and advised to go have blood work completed and to continue to monitor for now. Once results come in we will call the pt. Pt expressed understanding. Labs done and released. ([facility]) ?

## 2021-12-29 DIAGNOSIS — K625 Hemorrhage of anus and rectum: Secondary | ICD-10-CM | POA: Diagnosis not present

## 2021-12-29 DIAGNOSIS — K641 Second degree hemorrhoids: Secondary | ICD-10-CM | POA: Diagnosis not present

## 2021-12-29 LAB — CBC WITH DIFFERENTIAL/PLATELET
Absolute Monocytes: 836 cells/uL (ref 200–950)
Basophils Absolute: 90 cells/uL (ref 0–200)
Basophils Relative: 1.1 %
Eosinophils Absolute: 238 cells/uL (ref 15–500)
Eosinophils Relative: 2.9 %
HCT: 29.5 % — ABNORMAL LOW (ref 35.0–45.0)
Hemoglobin: 9.7 g/dL — ABNORMAL LOW (ref 11.7–15.5)
Lymphs Abs: 1607 cells/uL (ref 850–3900)
MCH: 33.1 pg — ABNORMAL HIGH (ref 27.0–33.0)
MCHC: 32.9 g/dL (ref 32.0–36.0)
MCV: 100.7 fL — ABNORMAL HIGH (ref 80.0–100.0)
MPV: 10.1 fL (ref 7.5–12.5)
Monocytes Relative: 10.2 %
Neutro Abs: 5428 cells/uL (ref 1500–7800)
Neutrophils Relative %: 66.2 %
Platelets: 305 10*3/uL (ref 140–400)
RBC: 2.93 10*6/uL — ABNORMAL LOW (ref 3.80–5.10)
RDW: 14.5 % (ref 11.0–15.0)
Total Lymphocyte: 19.6 %
WBC: 8.2 10*3/uL (ref 3.8–10.8)

## 2021-12-30 DIAGNOSIS — Z992 Dependence on renal dialysis: Secondary | ICD-10-CM | POA: Diagnosis not present

## 2021-12-30 DIAGNOSIS — E1129 Type 2 diabetes mellitus with other diabetic kidney complication: Secondary | ICD-10-CM | POA: Diagnosis not present

## 2021-12-30 DIAGNOSIS — N2581 Secondary hyperparathyroidism of renal origin: Secondary | ICD-10-CM | POA: Diagnosis not present

## 2021-12-30 DIAGNOSIS — E119 Type 2 diabetes mellitus without complications: Secondary | ICD-10-CM | POA: Diagnosis not present

## 2021-12-30 DIAGNOSIS — N186 End stage renal disease: Secondary | ICD-10-CM | POA: Diagnosis not present

## 2021-12-30 DIAGNOSIS — D631 Anemia in chronic kidney disease: Secondary | ICD-10-CM | POA: Diagnosis not present

## 2022-01-02 DIAGNOSIS — E1129 Type 2 diabetes mellitus with other diabetic kidney complication: Secondary | ICD-10-CM | POA: Diagnosis not present

## 2022-01-02 DIAGNOSIS — Z992 Dependence on renal dialysis: Secondary | ICD-10-CM | POA: Diagnosis not present

## 2022-01-02 DIAGNOSIS — E119 Type 2 diabetes mellitus without complications: Secondary | ICD-10-CM | POA: Diagnosis not present

## 2022-01-02 DIAGNOSIS — N186 End stage renal disease: Secondary | ICD-10-CM | POA: Diagnosis not present

## 2022-01-02 DIAGNOSIS — N2581 Secondary hyperparathyroidism of renal origin: Secondary | ICD-10-CM | POA: Diagnosis not present

## 2022-01-02 DIAGNOSIS — D631 Anemia in chronic kidney disease: Secondary | ICD-10-CM | POA: Diagnosis not present

## 2022-01-03 ENCOUNTER — Inpatient Hospital Stay (HOSPITAL_COMMUNITY): Payer: Medicare Other | Attending: Hematology

## 2022-01-03 DIAGNOSIS — G629 Polyneuropathy, unspecified: Secondary | ICD-10-CM | POA: Insufficient documentation

## 2022-01-03 DIAGNOSIS — C50211 Malignant neoplasm of upper-inner quadrant of right female breast: Secondary | ICD-10-CM | POA: Diagnosis not present

## 2022-01-03 DIAGNOSIS — M8580 Other specified disorders of bone density and structure, unspecified site: Secondary | ICD-10-CM | POA: Diagnosis not present

## 2022-01-03 DIAGNOSIS — Z170 Estrogen receptor positive status [ER+]: Secondary | ICD-10-CM | POA: Diagnosis not present

## 2022-01-03 DIAGNOSIS — N186 End stage renal disease: Secondary | ICD-10-CM | POA: Diagnosis not present

## 2022-01-03 DIAGNOSIS — E559 Vitamin D deficiency, unspecified: Secondary | ICD-10-CM | POA: Insufficient documentation

## 2022-01-03 LAB — COMPREHENSIVE METABOLIC PANEL
ALT: 20 U/L (ref 0–44)
AST: 16 U/L (ref 15–41)
Albumin: 3.8 g/dL (ref 3.5–5.0)
Alkaline Phosphatase: 87 U/L (ref 38–126)
Anion gap: 13 (ref 5–15)
BUN: 37 mg/dL — ABNORMAL HIGH (ref 6–20)
CO2: 28 mmol/L (ref 22–32)
Calcium: 8.7 mg/dL — ABNORMAL LOW (ref 8.9–10.3)
Chloride: 98 mmol/L (ref 98–111)
Creatinine, Ser: 10.14 mg/dL — ABNORMAL HIGH (ref 0.44–1.00)
GFR, Estimated: 4 mL/min — ABNORMAL LOW (ref >60)
Glucose, Bld: 110 mg/dL — ABNORMAL HIGH (ref 70–99)
Potassium: 3.8 mmol/L (ref 3.5–5.1)
Sodium: 139 mmol/L (ref 135–145)
Total Bilirubin: 0.7 mg/dL (ref 0.3–1.2)
Total Protein: 7.9 g/dL (ref 6.5–8.1)

## 2022-01-03 LAB — CBC WITH DIFFERENTIAL/PLATELET
Abs Immature Granulocytes: 0.03 10*3/uL (ref 0.00–0.07)
Basophils Absolute: 0.1 10*3/uL (ref 0.0–0.1)
Basophils Relative: 1 %
Eosinophils Absolute: 0.2 10*3/uL (ref 0.0–0.5)
Eosinophils Relative: 3 %
HCT: 31.4 % — ABNORMAL LOW (ref 36.0–46.0)
Hemoglobin: 10.3 g/dL — ABNORMAL LOW (ref 12.0–15.0)
Immature Granulocytes: 0 %
Lymphocytes Relative: 20 %
Lymphs Abs: 1.5 10*3/uL (ref 0.7–4.0)
MCH: 33.9 pg (ref 26.0–34.0)
MCHC: 32.8 g/dL (ref 30.0–36.0)
MCV: 103.3 fL — ABNORMAL HIGH (ref 80.0–100.0)
Monocytes Absolute: 0.8 10*3/uL (ref 0.1–1.0)
Monocytes Relative: 11 %
Neutro Abs: 4.8 10*3/uL (ref 1.7–7.7)
Neutrophils Relative %: 65 %
Platelets: 316 10*3/uL (ref 150–400)
RBC: 3.04 MIL/uL — ABNORMAL LOW (ref 3.87–5.11)
RDW: 15.1 % (ref 11.5–15.5)
WBC: 7.4 10*3/uL (ref 4.0–10.5)
nRBC: 0 % (ref 0.0–0.2)

## 2022-01-03 LAB — VITAMIN D 25 HYDROXY (VIT D DEFICIENCY, FRACTURES): Vit D, 25-Hydroxy: 15.89 ng/mL — ABNORMAL LOW (ref 30–100)

## 2022-01-04 DIAGNOSIS — E119 Type 2 diabetes mellitus without complications: Secondary | ICD-10-CM | POA: Diagnosis not present

## 2022-01-04 DIAGNOSIS — E1129 Type 2 diabetes mellitus with other diabetic kidney complication: Secondary | ICD-10-CM | POA: Diagnosis not present

## 2022-01-04 DIAGNOSIS — N2581 Secondary hyperparathyroidism of renal origin: Secondary | ICD-10-CM | POA: Diagnosis not present

## 2022-01-04 DIAGNOSIS — Z992 Dependence on renal dialysis: Secondary | ICD-10-CM | POA: Diagnosis not present

## 2022-01-04 DIAGNOSIS — N186 End stage renal disease: Secondary | ICD-10-CM | POA: Diagnosis not present

## 2022-01-04 DIAGNOSIS — D631 Anemia in chronic kidney disease: Secondary | ICD-10-CM | POA: Diagnosis not present

## 2022-01-04 LAB — CANCER ANTIGEN 27.29: CA 27.29: 34.6 U/mL (ref 0.0–38.6)

## 2022-01-04 LAB — CANCER ANTIGEN 15-3: CA 15-3: 26.3 U/mL — ABNORMAL HIGH (ref 0.0–25.0)

## 2022-01-05 ENCOUNTER — Encounter: Payer: Self-pay | Admitting: Gastroenterology

## 2022-01-05 ENCOUNTER — Ambulatory Visit (INDEPENDENT_AMBULATORY_CARE_PROVIDER_SITE_OTHER): Payer: Medicare Other | Admitting: Gastroenterology

## 2022-01-05 VITALS — BP 120/62 | HR 85 | Temp 97.5°F | Ht 61.0 in | Wt 181.4 lb

## 2022-01-05 DIAGNOSIS — K641 Second degree hemorrhoids: Secondary | ICD-10-CM | POA: Diagnosis not present

## 2022-01-05 NOTE — Patient Instructions (Signed)
Please continue to avoid straining.  You should limit your toilet time to 2-3 minutes at the most.   Continue to avoid constipation.  Please call me with any concerns or issues!  I will see you in follow-up in 6 months! Please call if you feel you need further banding!  I enjoyed seeing you again today! As you know, I value our relationship and want to provide genuine, compassionate, and quality care. I welcome your feedback. If you receive a survey regarding your visit,  I greatly appreciate you taking time to fill this out. See you next time!  Annitta Needs, PhD, ANP-BC [facility]

## 2022-01-05 NOTE — Progress Notes (Signed)
    [place] BANDING PROCEDURE NOTE  HOLDEN DRAUGHON is a 61 y.o. female presenting today for consideration of hemorrhoid banding. Last colonoscopy with internal hemorrhoids. Has had right anterior and left lateral banding. Improvement noted overall in symptoms. Still with some itching. Last bleeding a week ago.    The patient presents with symptomatic grade 2 hemorrhoids, unresponsive to maximal medical therapy, requesting rubber band ligation of his/her hemorrhoidal disease. All risks, benefits, and alternative forms of therapy were described and informed consent was obtained.  The decision was made to band the right posterior internal hemorrhoid, and the [place] was used to perform band ligation without complication. Digital anorectal examination was then performed to assure proper positioning of the band, and to adjust the banded tissue as required. The patient was discharged home without pain or other issues. Dietary and behavioral recommendations were given, along with follow-up instructions. The patient will return in several weeks for followup and possible additional banding as required.  No complications were encountered and the patient tolerated the procedure well.   Annitta Needs, PhD, ANP-BC [facility] Gastroenterology

## 2022-01-06 ENCOUNTER — Other Ambulatory Visit: Payer: Self-pay | Admitting: Family Medicine

## 2022-01-06 DIAGNOSIS — D631 Anemia in chronic kidney disease: Secondary | ICD-10-CM | POA: Diagnosis not present

## 2022-01-06 DIAGNOSIS — E1129 Type 2 diabetes mellitus with other diabetic kidney complication: Secondary | ICD-10-CM | POA: Diagnosis not present

## 2022-01-06 DIAGNOSIS — N2581 Secondary hyperparathyroidism of renal origin: Secondary | ICD-10-CM | POA: Diagnosis not present

## 2022-01-06 DIAGNOSIS — N186 End stage renal disease: Secondary | ICD-10-CM | POA: Diagnosis not present

## 2022-01-06 DIAGNOSIS — E119 Type 2 diabetes mellitus without complications: Secondary | ICD-10-CM | POA: Diagnosis not present

## 2022-01-06 DIAGNOSIS — Z992 Dependence on renal dialysis: Secondary | ICD-10-CM | POA: Diagnosis not present

## 2022-01-09 DIAGNOSIS — N186 End stage renal disease: Secondary | ICD-10-CM | POA: Diagnosis not present

## 2022-01-09 DIAGNOSIS — N2581 Secondary hyperparathyroidism of renal origin: Secondary | ICD-10-CM | POA: Diagnosis not present

## 2022-01-09 DIAGNOSIS — Z992 Dependence on renal dialysis: Secondary | ICD-10-CM | POA: Diagnosis not present

## 2022-01-09 DIAGNOSIS — E1129 Type 2 diabetes mellitus with other diabetic kidney complication: Secondary | ICD-10-CM | POA: Diagnosis not present

## 2022-01-09 DIAGNOSIS — D631 Anemia in chronic kidney disease: Secondary | ICD-10-CM | POA: Diagnosis not present

## 2022-01-09 DIAGNOSIS — E119 Type 2 diabetes mellitus without complications: Secondary | ICD-10-CM | POA: Diagnosis not present

## 2022-01-10 ENCOUNTER — Inpatient Hospital Stay (HOSPITAL_BASED_OUTPATIENT_CLINIC_OR_DEPARTMENT_OTHER): Payer: Medicare Other | Admitting: Hematology

## 2022-01-10 VITALS — BP 139/80 | HR 84 | Temp 98.3°F | Resp 16 | Wt 181.9 lb

## 2022-01-10 DIAGNOSIS — M8580 Other specified disorders of bone density and structure, unspecified site: Secondary | ICD-10-CM | POA: Diagnosis not present

## 2022-01-10 DIAGNOSIS — C50211 Malignant neoplasm of upper-inner quadrant of right female breast: Secondary | ICD-10-CM

## 2022-01-10 DIAGNOSIS — Z170 Estrogen receptor positive status [ER+]: Secondary | ICD-10-CM

## 2022-01-10 DIAGNOSIS — N186 End stage renal disease: Secondary | ICD-10-CM | POA: Diagnosis not present

## 2022-01-10 DIAGNOSIS — G629 Polyneuropathy, unspecified: Secondary | ICD-10-CM | POA: Diagnosis not present

## 2022-01-10 DIAGNOSIS — E559 Vitamin D deficiency, unspecified: Secondary | ICD-10-CM | POA: Diagnosis not present

## 2022-01-10 NOTE — Telephone Encounter (Signed)
Requested Prescriptions  Pending Prescriptions Disp Refills  . gabapentin (NEURONTIN) 300 MG capsule [Pharmacy Med Name: GABAPENTIN '300MG'$  CAPSULES] 90 capsule 1    Sig: TAKE 1 CAPSULE(300 MG) BY MOUTH AT BEDTIME     Neurology: Anticonvulsants - gabapentin Failed - 01/06/2022 11:21 AM      Failed - Cr in normal range and within 360 days    Creat  Date Value Ref Range Status  08/26/2021 6.69 (H) 0.50 - 1.03 mg/dL Final   Creatinine, Ser  Date Value Ref Range Status  01/03/2022 10.14 (H) 0.44 - 1.00 mg/dL Final         Passed - Completed PHQ-2 or PHQ-9 in the last 360 days      Passed - Valid encounter within last 12 months    Recent Outpatient Visits          2 months ago Medication care plan discussed with patient   [place] [name], MD   4 months ago Dyspnea, unspecified type   [place] [name], MD   10 months ago Clostridium difficile colitis   [place] [name], MD   11 months ago Clostridium difficile colitis   [place] Dennard Schaumann, Cammie Mcgee, MD   1 year ago Clostridium difficile colitis   [place] Pickard, Cammie Mcgee, MD      Future Appointments            In 5 months [place], [name], MD [place], [place]   In 6 months Derek Jack, MD [facility]

## 2022-01-10 NOTE — Patient Instructions (Addendum)
[place] at [facility] Discharge Instructions   You were seen and examined today by Dr. Delton Coombes.  He reviewed the results of your lab work.  All results are normal/stable except for your Vitamin D. It is low. You should start taking Vitamin D over the counter 1000 units daily.   We will see you back in 6 months for lab work and exam.   Thank you for choosing [place] at [facility] to provide your oncology and hematology care.  To afford each patient quality time with our provider, please arrive at least 15 minutes before your scheduled appointment time.   If you have a lab appointment with the [facility] please come in thru the Main Entrance and check in at the main information desk.  You need to re-schedule your appointment should you arrive 10 or more minutes late.  We strive to give you quality time with our providers, and arriving late affects you and other patients whose appointments are after yours.  Also, if you no show three or more times for appointments you may be dismissed from the clinic at the providers discretion.     Again, thank you for choosing [facility].  Our hope is that these requests will decrease the amount of time that you wait before being seen by our physicians.       _____________________________________________________________  Should you have questions after your visit to [facility], please contact our office at [phone] and follow the prompts.  Our office hours are 8:00 a.m. and 4:30 p.m. Monday - Friday.  Please note that voicemails left after 4:00 p.m. may not be returned until the following business day.  We are closed weekends and major holidays.  You do have access to a nurse 24-7, just call the main number to the clinic [phone] and do not press any options, hold on the line and a nurse will answer the phone.    For prescription refill requests, have your pharmacy  contact our office and allow 72 hours.    Due to Covid, you will need to wear a mask upon entering the hospital. If you do not have a mask, a mask will be given to you at the Main Entrance upon arrival. For doctor visits, patients may have 1 support person age 24 or older with them. For treatment visits, patients can not have anyone with them due to social distancing guidelines and our immunocompromised population.

## 2022-01-10 NOTE — Progress Notes (Signed)
[facility] [address] [place] [address]   Patient Care Team: [name], MD as PCP - General (Family Medicine) [name], [name], MD as PCP - Cardiology (Cardiology) [name] [name], MD (Gastroenterology) [name], MD as Consulting Physician (Cardiology) [name], RN as Oncology Nurse Navigator (Oncology)  SUMMARY OF ONCOLOGIC HISTORY: Oncology History  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[place])  09/18/2018 Initial Diagnosis   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[place])    09/26/2018 Cancer Staging   Staging form: Breast, AJCC 8th Edition - Clinical stage from 09/26/2018: Stage IB (cT1c, cN0, cM0, G3, ER+, PR-, HER2-) - Signed by Derek Jack, MD on 09/26/2018    10/30/2018 -  Chemotherapy   The patient had DOXOrubicin (ADRIAMYCIN) chemo injection 108 mg, 60 mg/m2 = 108 mg, Intravenous,  Once, 4 of 4 cycles Administration: 108 mg (10/30/2018), 108 mg (11/20/2018), 108 mg (12/11/2018), 108 mg (01/01/2019) palonosetron (ALOXI) injection 0.25 mg, 0.25 mg, Intravenous,  Once, 4 of 4 cycles Administration: 0.25 mg (10/30/2018), 0.25 mg (11/20/2018), 0.25 mg (12/11/2018), 0.25 mg (01/01/2019) pegfilgrastim (NEULASTA ONPRO KIT) injection 6 mg, 6 mg, Subcutaneous, Once, 4 of 4 cycles Administration: 6 mg (10/30/2018), 6 mg (11/20/2018), 6 mg (12/11/2018), 6 mg (01/01/2019) cyclophosphamide (CYTOXAN) 820 mg in sodium chloride 0.9 % 250 mL chemo infusion, 450 mg/m2 = 820 mg (75 % of original dose 600 mg/m2), Intravenous,  Once, 4 of 4 cycles Dose modification: 450 mg/m2 (75 % of original dose 600 mg/m2, Cycle 1, Reason: Other (see comments), Comment: hemodialysis) Administration: 820 mg (10/30/2018), 820 mg (11/20/2018), 820 mg (12/11/2018), 820 mg (01/01/2019) fosaprepitant (EMEND) 150 mg, dexamethasone (DECADRON) 12 mg in sodium chloride 0.9 % 145 mL IVPB, , Intravenous,  Once, 4 of 4  cycles Administration:  (10/30/2018),  (11/20/2018),  (12/11/2018),  (01/01/2019)   for chemotherapy treatment.       CHIEF COMPLIANT: Follow-up of right breast cancer   INTERVAL HISTORY: [name] is a 61 y.o. female here today for follow up of her right breast cancer. Her last visit was on 09/08/2021.   Today she reports feeling good. She denies new aches and pains, and her arthritic pains are stable. She denies hot flashes. She denies ankle swellings.    REVIEW OF SYSTEMS:   Review of Systems  Constitutional:  Negative for appetite change and fatigue.  Cardiovascular:  Negative for leg swelling.  Endocrine: Negative for hot flashes.  Musculoskeletal:  Positive for arthralgias (stable).  Neurological:  Positive for headaches.  All other systems reviewed and are negative.  I have reviewed the past medical history, past surgical history, social history and family history with the patient and they are unchanged from previous note.   ALLERGIES:   has No Known Allergies.   MEDICATIONS:  Current Outpatient Medications  Medication Sig Dispense Refill   amLODipine (NORVASC) 10 MG tablet TAKE 1 TABLET BY MOUTH AT BEDTIME 90 tablet 3   anastrozole (ARIMIDEX) 1 MG tablet TAKE 1 TABLET(1 MG) BY MOUTH DAILY 30 tablet 4   aspirin EC 81 MG tablet Take 81 mg by mouth daily.     atorvastatin (LIPITOR) 20 MG tablet TAKE 1 TABLET DAILY (NEEDS OFFICE VISIT) 90 tablet 3   B Complex-C-Folic Acid (DIALYVITE 295) 0.8 MG TABS Take 1 tablet by mouth daily.     cinacalcet (SENSIPAR) 30 MG tablet Take 2 tablets (60 mg total) by mouth daily. (Patient  taking differently: Take 30 mg by mouth daily.) 90 tablet 0   Darbepoetin Alfa (ARANESP, ALBUMIN FREE, IJ) Take once a week with dialysis unsure of dose     DULoxetine (CYMBALTA) 30 MG capsule TAKE 1 CAPSULE(30 MG) BY MOUTH DAILY 90 capsule 1   gabapentin (NEURONTIN) 300 MG capsule TAKE 1 CAPSULE(300 MG) BY MOUTH AT BEDTIME 90 capsule 1    HYDROcodone-acetaminophen (NORCO/VICODIN) 5-325 MG tablet Take 1 tablet by mouth every 6 (six) hours as needed. 20 tablet 0   isosorbide mononitrate (IMDUR) 30 MG 24 hr tablet Take 0.5 tablets (15 mg total) by mouth daily. 15 tablet 6   lanthanum (FOSRENOL) 1000 MG chewable tablet Chew 2,000-3,000 mg by mouth See admin instructions. Take 3 tablets (3000 mg) by mouth with meals and take 2 tablets (2000 mg) with snacks     meclizine (ANTIVERT) 25 MG tablet Take 1 tablet (25 mg total) by mouth 3 (three) times daily as needed for dizziness. 30 tablet 0   nebivolol (BYSTOLIC) 10 MG tablet Take 1 tablet (10 mg total) by mouth daily. (Patient taking differently: Take 10 mg by mouth in the morning and at bedtime.) 90 tablet 3   pantoprazole (PROTONIX) 40 MG tablet TAKE 1 TABLET(40 MG) BY MOUTH DAILY 90 tablet 3   VITAMIN D PO Take by mouth.     lidocaine-prilocaine (EMLA) cream Apply 1 application topically every Monday, Wednesday, and Friday with hemodialysis. (Patient not taking: Reported on 01/10/2022)     No current facility-administered medications for this visit.     PHYSICAL EXAMINATION: Performance status (ECOG): 1 - Symptomatic but completely ambulatory  Vitals:   01/10/22 0816  BP: 139/80  Pulse: 84  Resp: 16  Temp: 98.3 F (36.8 C)  SpO2: 98%   Wt Readings from Last 3 Encounters:  01/10/22 181 lb 14.4 oz (82.5 kg)  01/05/22 181 lb 6.4 oz (82.3 kg)  12/22/21 182 lb 3.2 oz (82.6 kg)   Physical Exam Vitals reviewed.  Constitutional:      Appearance: Normal appearance. She is obese.  Cardiovascular:     Rate and Rhythm: Normal rate and regular rhythm.     Pulses: Normal pulses.     Heart sounds: Normal heart sounds.  Pulmonary:     Effort: Pulmonary effort is normal.     Breath sounds: Normal breath sounds.  Chest:  Breasts:    Right: Absent. No swelling, bleeding, mass, skin change (mastectomy site WNL) or tenderness.     Left: Absent. No swelling, bleeding, mass, skin  change (mastectomy site WNL) or tenderness.  Abdominal:     Palpations: Abdomen is soft. There is no hepatomegaly, splenomegaly or mass.     Tenderness: There is no abdominal tenderness.  Musculoskeletal:     Right lower leg: No edema.     Left lower leg: No edema.  Lymphadenopathy:     Upper Body:     Right upper body: No supraclavicular or axillary adenopathy.     Left upper body: No supraclavicular or axillary adenopathy.  Neurological:     General: No focal deficit present.     Mental Status: She is alert and oriented to person, place, and time.  Psychiatric:        Mood and Affect: Mood normal.        Behavior: Behavior normal.    Breast Exam Chaperone: Thana Ates     LABORATORY DATA:  I have reviewed the data as listed    Latest Ref Rng &  Units 01/03/2022    9:24 AM 11/17/2021   10:27 AM 08/31/2021   12:23 AM  CMP  Glucose 70 - 99 mg/dL 110   87   108    BUN 6 - 20 mg/dL 37   44   62    Creatinine 0.44 - 1.00 mg/dL 10.14   9.96   11.98    Sodium 135 - 145 mmol/L 139   141   136    Potassium 3.5 - 5.1 mmol/L 3.8   3.5   3.2    Chloride 98 - 111 mmol/L 98   99   98    CO2 22 - 32 mmol/L 28   27   23     Calcium 8.9 - 10.3 mg/dL 8.7   10.0   9.2    Total Protein 6.5 - 8.1 g/dL 7.9      Total Bilirubin 0.3 - 1.2 mg/dL 0.7      Alkaline Phos 38 - 126 U/L 87      AST 15 - 41 U/L 16      ALT 0 - 44 U/L 20       Lab Results  Component Value Date   CAN153 26.3 (H) 01/03/2022   CAN153 28.0 (H) 08/23/2021   Lab Results  Component Value Date   WBC 7.4 01/03/2022   HGB 10.3 (L) 01/03/2022   HCT 31.4 (L) 01/03/2022   MCV 103.3 (H) 01/03/2022   PLT 316 01/03/2022   NEUTROABS 4.8 01/03/2022    ASSESSMENT:  1.  Stage Ib (T1CN0) right breast cancer, ER positive, PR and HER-2 negative: -Right lumpectomy and sentinel lymph node biopsy on 09/18/2018 shows 1.5 cm IDC, grade 3, associated with high-grade DCIS, negative margins, 0/3 lymph nodes positive, ER 50%, PR negative and  HER-2 negative, Ki-67 60%. -Oncotype DX recurrence score 47.  Distant recurrence at 9 years with tamoxifen alone is 36%.  Absolute chemotherapy benefit is more than 15%. -Adjuvant chemotherapy with 4 cycles of AC from 10/30/2018 through 01/01/2019. -She was started on anastrozole in June 2020. -Mammogram on 09/02/2019 showed calcifications in the posterior aspect of upper outer quadrant of the right breast. -Right partial mastectomy on 02/18/2020 shows high-grade DCIS, 1.5 cm, no invasive carcinoma, resection margins negative.  0/11 lymph nodes.  ER 30% positive, PR negative. -Left simple mastectomy on 06/15/2020 was benign.  Right breast biopsy shows microscopic focus of invasive ductal carcinoma in the background of extensive high-grade DCIS. -Right breast lumpectomy on 07/13/2020 shows multifocal invasive ductal carcinoma, grade 3, largest measuring 1.2 cm.  Extensive high-grade DCIS with necrosis.  Margins are free.  PT1CNX.  There are multiple foci of invasive carcinoma arising from extensive DCIS.  Several small foci of invasive tumor arising from DCIS indicating new primaries.  ER/PR/HER-2 is negative.  Ki-67 is 20%. -PET scan on 09/14/2020 shows areas of nodularity with discrete nodule in the fat of the inferior chest wall within the subcutaneous tissues. -Ultrasound of the right chest wall shows masslike abnormality in the upper inner aspect of the right anterior chest at 1 o'clock position measuring 4.1 cm in greatest dimension.  1.7 cm hypoechoic mass in the central anterior right chest concerning for malignancy.  Possible borderline enlarged residual right axillary lymph node. -Right breast biopsy on 10/12/2020 shows 1.3 cm invasive ductal carcinoma, focally 0.1 cm from superior margin.  DCIS is less than 0.1 cm from superior margin.  PT1CPNX. - Right mastectomy followed by radiation therapy completed in May 2022.  2.  Osteopenia: -Bone density on 01/20/2019 shows T score -1.9. - DEXA scan  (03/24/2021) T score -2.4   PLAN:  1.  Recurrent right breast cancer, triple receptor negative: - She is tolerating anastrozole without any major hot flashes or musculoskeletal symptoms. - Physical exam today shows bilateral mastectomy sites are within normal limits. - Labs reviewed today normal LFTs.  CBC was grossly normal with mild anemia.  CA 15-3 is 26.3 and slightly better than last time. - Continue anastrozole.  RTC 6 months with repeat labs.     2.  Osteopenia/vitamin D deficiency: - Vitamin D levels are low at 15.8. - She is reportedly taking vitamin D with other vitamins with dialysis. - She was told to talk to Dr. Marval Regal to increase vitamin D intake.   3.  ESRD on HD: - Continue HD on Monday/Wednesday/Friday under the direction of Dr. Marval Regal.   4.  Neuropathy: - Continue gabapentin at bedtime for burning sensation in the bilateral feet.  Breast Cancer therapy associated bone loss: I have recommended calcium, Vitamin D and weight bearing exercises.  Orders placed this encounter:  No orders of the defined types were placed in this encounter.   The patient has a good understanding of the overall plan. She agrees with it. She will call with any problems that may develop before the next visit here.  Derek Jack, MD [place] [phone]   I, Thana Ates, am acting as a scribe for Dr. Derek Jack.  I, Derek Jack MD, have reviewed the above documentation for accuracy and completeness, and I agree with the above.

## 2022-01-10 NOTE — Progress Notes (Signed)
Patient is taking anastrozole as prescribed.  She has not missed any doses and reports no side effects at this time.   

## 2022-01-11 DIAGNOSIS — E1129 Type 2 diabetes mellitus with other diabetic kidney complication: Secondary | ICD-10-CM | POA: Diagnosis not present

## 2022-01-11 DIAGNOSIS — N186 End stage renal disease: Secondary | ICD-10-CM | POA: Diagnosis not present

## 2022-01-11 DIAGNOSIS — Z992 Dependence on renal dialysis: Secondary | ICD-10-CM | POA: Diagnosis not present

## 2022-01-11 DIAGNOSIS — N2581 Secondary hyperparathyroidism of renal origin: Secondary | ICD-10-CM | POA: Diagnosis not present

## 2022-01-11 DIAGNOSIS — D631 Anemia in chronic kidney disease: Secondary | ICD-10-CM | POA: Diagnosis not present

## 2022-01-11 DIAGNOSIS — E119 Type 2 diabetes mellitus without complications: Secondary | ICD-10-CM | POA: Diagnosis not present

## 2022-01-12 DIAGNOSIS — N040 Nephrotic syndrome with minor glomerular abnormality: Secondary | ICD-10-CM | POA: Diagnosis not present

## 2022-01-12 DIAGNOSIS — N186 End stage renal disease: Secondary | ICD-10-CM | POA: Diagnosis not present

## 2022-01-12 DIAGNOSIS — Z992 Dependence on renal dialysis: Secondary | ICD-10-CM | POA: Diagnosis not present

## 2022-01-13 ENCOUNTER — Other Ambulatory Visit: Payer: Self-pay | Admitting: Family Medicine

## 2022-01-13 ENCOUNTER — Telehealth: Payer: Self-pay | Admitting: *Deleted

## 2022-01-13 DIAGNOSIS — N186 End stage renal disease: Secondary | ICD-10-CM | POA: Diagnosis not present

## 2022-01-13 DIAGNOSIS — E1129 Type 2 diabetes mellitus with other diabetic kidney complication: Secondary | ICD-10-CM | POA: Diagnosis not present

## 2022-01-13 DIAGNOSIS — E119 Type 2 diabetes mellitus without complications: Secondary | ICD-10-CM | POA: Diagnosis not present

## 2022-01-13 DIAGNOSIS — Z992 Dependence on renal dialysis: Secondary | ICD-10-CM | POA: Diagnosis not present

## 2022-01-13 DIAGNOSIS — N2581 Secondary hyperparathyroidism of renal origin: Secondary | ICD-10-CM | POA: Diagnosis not present

## 2022-01-13 DIAGNOSIS — D631 Anemia in chronic kidney disease: Secondary | ICD-10-CM | POA: Diagnosis not present

## 2022-01-13 MED ORDER — HYDROCODONE-ACETAMINOPHEN 5-325 MG PO TABS: 1.0000 | ORAL_TABLET | Freq: Four times a day (QID) | ORAL | 0 refills | Status: DC | PRN

## 2022-01-13 NOTE — Telephone Encounter (Signed)
Received call from patient [phone]~ telephone.   Requested refill on Hydrocodone/ APAP 5/325 mg. Last refill 11/17/2021 for axillary and upper chest pain S/P R Breast Mass Excision 10/12/2020.  Discussed with Dr. Arnoldo Morale who advised that surgery >1 year prior is too long to continue to fill pain medications. Advised to contact PCP for further pain management.   Routed to DR [facility] for review.

## 2022-01-17 DIAGNOSIS — N2581 Secondary hyperparathyroidism of renal origin: Secondary | ICD-10-CM | POA: Diagnosis not present

## 2022-01-17 DIAGNOSIS — E119 Type 2 diabetes mellitus without complications: Secondary | ICD-10-CM | POA: Diagnosis not present

## 2022-01-17 DIAGNOSIS — Z992 Dependence on renal dialysis: Secondary | ICD-10-CM | POA: Diagnosis not present

## 2022-01-17 DIAGNOSIS — N186 End stage renal disease: Secondary | ICD-10-CM | POA: Diagnosis not present

## 2022-01-17 DIAGNOSIS — D631 Anemia in chronic kidney disease: Secondary | ICD-10-CM | POA: Diagnosis not present

## 2022-01-17 DIAGNOSIS — E1129 Type 2 diabetes mellitus with other diabetic kidney complication: Secondary | ICD-10-CM | POA: Diagnosis not present

## 2022-01-18 DIAGNOSIS — Z992 Dependence on renal dialysis: Secondary | ICD-10-CM | POA: Diagnosis not present

## 2022-01-18 DIAGNOSIS — E1129 Type 2 diabetes mellitus with other diabetic kidney complication: Secondary | ICD-10-CM | POA: Diagnosis not present

## 2022-01-18 DIAGNOSIS — N186 End stage renal disease: Secondary | ICD-10-CM | POA: Diagnosis not present

## 2022-01-18 DIAGNOSIS — N2581 Secondary hyperparathyroidism of renal origin: Secondary | ICD-10-CM | POA: Diagnosis not present

## 2022-01-18 DIAGNOSIS — E119 Type 2 diabetes mellitus without complications: Secondary | ICD-10-CM | POA: Diagnosis not present

## 2022-01-18 DIAGNOSIS — D631 Anemia in chronic kidney disease: Secondary | ICD-10-CM | POA: Diagnosis not present

## 2022-01-20 ENCOUNTER — Other Ambulatory Visit: Payer: Self-pay | Admitting: Family Medicine

## 2022-01-20 DIAGNOSIS — N186 End stage renal disease: Secondary | ICD-10-CM | POA: Diagnosis not present

## 2022-01-20 DIAGNOSIS — Z992 Dependence on renal dialysis: Secondary | ICD-10-CM | POA: Diagnosis not present

## 2022-01-20 DIAGNOSIS — D631 Anemia in chronic kidney disease: Secondary | ICD-10-CM | POA: Diagnosis not present

## 2022-01-20 DIAGNOSIS — E119 Type 2 diabetes mellitus without complications: Secondary | ICD-10-CM | POA: Diagnosis not present

## 2022-01-20 DIAGNOSIS — E1129 Type 2 diabetes mellitus with other diabetic kidney complication: Secondary | ICD-10-CM | POA: Diagnosis not present

## 2022-01-20 DIAGNOSIS — N2581 Secondary hyperparathyroidism of renal origin: Secondary | ICD-10-CM | POA: Diagnosis not present

## 2022-01-21 ENCOUNTER — Other Ambulatory Visit: Payer: Self-pay | Admitting: Nurse Practitioner

## 2022-01-23 DIAGNOSIS — N2581 Secondary hyperparathyroidism of renal origin: Secondary | ICD-10-CM | POA: Diagnosis not present

## 2022-01-23 DIAGNOSIS — D631 Anemia in chronic kidney disease: Secondary | ICD-10-CM | POA: Diagnosis not present

## 2022-01-23 DIAGNOSIS — E119 Type 2 diabetes mellitus without complications: Secondary | ICD-10-CM | POA: Diagnosis not present

## 2022-01-23 DIAGNOSIS — N186 End stage renal disease: Secondary | ICD-10-CM | POA: Diagnosis not present

## 2022-01-23 DIAGNOSIS — E1129 Type 2 diabetes mellitus with other diabetic kidney complication: Secondary | ICD-10-CM | POA: Diagnosis not present

## 2022-01-23 DIAGNOSIS — Z992 Dependence on renal dialysis: Secondary | ICD-10-CM | POA: Diagnosis not present

## 2022-01-25 DIAGNOSIS — E119 Type 2 diabetes mellitus without complications: Secondary | ICD-10-CM | POA: Diagnosis not present

## 2022-01-25 DIAGNOSIS — N186 End stage renal disease: Secondary | ICD-10-CM | POA: Diagnosis not present

## 2022-01-25 DIAGNOSIS — E1129 Type 2 diabetes mellitus with other diabetic kidney complication: Secondary | ICD-10-CM | POA: Diagnosis not present

## 2022-01-25 DIAGNOSIS — Z992 Dependence on renal dialysis: Secondary | ICD-10-CM | POA: Diagnosis not present

## 2022-01-25 DIAGNOSIS — D631 Anemia in chronic kidney disease: Secondary | ICD-10-CM | POA: Diagnosis not present

## 2022-01-25 DIAGNOSIS — N2581 Secondary hyperparathyroidism of renal origin: Secondary | ICD-10-CM | POA: Diagnosis not present

## 2022-01-27 ENCOUNTER — Other Ambulatory Visit: Payer: Self-pay

## 2022-01-27 ENCOUNTER — Emergency Department (HOSPITAL_COMMUNITY)
Admission: EM | Admit: 2022-01-27 | Discharge: 2022-01-28 | Disposition: A | Discharge status: Home / Self Care | Payer: Medicare Other | Attending: Emergency Medicine | Admitting: Emergency Medicine

## 2022-01-27 ENCOUNTER — Encounter (HOSPITAL_COMMUNITY): Payer: Self-pay

## 2022-01-27 DIAGNOSIS — Z79899 Other long term (current) drug therapy: Secondary | ICD-10-CM | POA: Insufficient documentation

## 2022-01-27 DIAGNOSIS — R197 Diarrhea, unspecified: Secondary | ICD-10-CM | POA: Insufficient documentation

## 2022-01-27 DIAGNOSIS — R110 Nausea: Secondary | ICD-10-CM | POA: Insufficient documentation

## 2022-01-27 DIAGNOSIS — N186 End stage renal disease: Secondary | ICD-10-CM | POA: Diagnosis not present

## 2022-01-27 DIAGNOSIS — Z853 Personal history of malignant neoplasm of breast: Secondary | ICD-10-CM | POA: Insufficient documentation

## 2022-01-27 DIAGNOSIS — Z7982 Long term (current) use of aspirin: Secondary | ICD-10-CM | POA: Insufficient documentation

## 2022-01-27 DIAGNOSIS — R1084 Generalized abdominal pain: Secondary | ICD-10-CM | POA: Insufficient documentation

## 2022-01-27 DIAGNOSIS — I120 Hypertensive chronic kidney disease with stage 5 chronic kidney disease or end stage renal disease: Secondary | ICD-10-CM | POA: Diagnosis not present

## 2022-01-27 DIAGNOSIS — Z992 Dependence on renal dialysis: Secondary | ICD-10-CM | POA: Diagnosis not present

## 2022-01-27 DIAGNOSIS — R7989 Other specified abnormal findings of blood chemistry: Secondary | ICD-10-CM | POA: Insufficient documentation

## 2022-01-27 DIAGNOSIS — E1129 Type 2 diabetes mellitus with other diabetic kidney complication: Secondary | ICD-10-CM | POA: Diagnosis not present

## 2022-01-27 DIAGNOSIS — E1122 Type 2 diabetes mellitus with diabetic chronic kidney disease: Secondary | ICD-10-CM | POA: Insufficient documentation

## 2022-01-27 DIAGNOSIS — N2581 Secondary hyperparathyroidism of renal origin: Secondary | ICD-10-CM | POA: Diagnosis not present

## 2022-01-27 DIAGNOSIS — E119 Type 2 diabetes mellitus without complications: Secondary | ICD-10-CM | POA: Diagnosis not present

## 2022-01-27 DIAGNOSIS — D631 Anemia in chronic kidney disease: Secondary | ICD-10-CM | POA: Diagnosis not present

## 2022-01-27 MED ORDER — DICYCLOMINE HCL 10 MG PO CAPS
10.0000 mg | ORAL_CAPSULE | Freq: Once | ORAL | Status: AC
  Administered 2022-01-28: 10 mg via ORAL
  Filled 2022-01-27: qty 1

## 2022-01-27 NOTE — ED Provider Notes (Signed)
[facility] Provider Note   CSN: [id number] Arrival date & time: 01/27/22  2227     History {Add pertinent medical, surgical, social history, OB history to HPI:1} Chief Complaint  Patient presents with   Diarrhea    Megan [name] is a 61 y.o. female.  HPI     This is a 61 year old female with a history of end-stage renal disease who presents with diarrhea.  Patient reports onset of nonbloody diarrhea around 5 PM today.  She reports multiple episodes of watery stool.  She developed significant abdominal cramping.  She has taken Imodium with no relief.  She has had some nausea but no vomiting.  Describes diffuse abdominal cramping that is nonlocalized.  She last dialyzed today.  She did report some streaked blood when she wiped but has banded hemorrhoids.  She denies any recent antibiotic use or travel.  She does have a history of C. difficile.  Home Medications Prior to Admission medications   Medication Sig Start Date End Date Taking? Authorizing Provider  amLODipine (NORVASC) 10 MG tablet TAKE 1 TABLET BY MOUTH AT BEDTIME 11/25/20   [name], MD  anastrozole (ARIMIDEX) 1 MG tablet TAKE 1 TABLET(1 MG) BY MOUTH DAILY 08/19/21   Derek Jack, MD  aspirin EC 81 MG tablet Take 81 mg by mouth daily.    [provider]  atorvastatin (LIPITOR) 20 MG tablet TAKE 1 TABLET DAILY (NEEDS OFFICE VISIT) 06/13/21   [name], MD  B Complex-C-Folic Acid (DIALYVITE 102) 0.8 MG TABS Take 1 tablet by mouth daily. 01/22/18   [provider]  cinacalcet (SENSIPAR) 30 MG tablet Take 2 tablets (60 mg total) by mouth daily. Patient taking differently: Take 30 mg by mouth daily. 06/13/18   [name], MD  Darbepoetin Alfa (ARANESP, ALBUMIN FREE, IJ) Take once a week with dialysis unsure of dose 07/01/21 06/30/22  [provider]  DULoxetine (CYMBALTA) 30 MG capsule TAKE 1 CAPSULE(30 MG) BY MOUTH DAILY 08/19/21   [name], MD  gabapentin (NEURONTIN) 300 MG capsule TAKE 1 CAPSULE(300 MG) BY MOUTH AT BEDTIME 01/10/22   [name], MD  HYDROcodone-acetaminophen (NORCO/VICODIN) 5-325 MG tablet Take 1 tablet by mouth every 6 (six) hours as needed. 01/13/22   [name], MD  isosorbide mononitrate (IMDUR) 30 MG 24 hr tablet Take 0.5 tablets (15 mg total) by mouth daily. 12/09/21   Arnoldo Lenis, MD  lanthanum (FOSRENOL) 1000 MG chewable tablet Chew 2,000-3,000 mg by mouth See admin instructions. Take 3 tablets (3000 mg) by mouth with meals and take 2 tablets (2000 mg) with snacks 10/16/19   [provider]  lidocaine-prilocaine (EMLA) cream Apply 1 application topically every Monday, Wednesday, and Friday with hemodialysis. Patient not taking: Reported on 01/10/2022 09/24/18   [provider]  meclizine (ANTIVERT) 25 MG tablet Take 1 tablet (25 mg total) by mouth 3 (three) times daily as needed for dizziness. 11/08/21   [name], MD  nebivolol (BYSTOLIC) 10 MG tablet Take 1 tablet (10 mg total) by mouth daily. Patient taking differently: Take 10 mg by mouth in the morning and at bedtime. 11/08/21   [name], MD  pantoprazole (PROTONIX) 40 MG tablet TAKE 1 TABLET(40 MG) BY MOUTH DAILY 01/20/22   [name], MD  VITAMIN D PO Take by mouth. 08/24/21 08/23/22  [provider]      Allergies    Patient has no known allergies.  Review of Systems   Review of Systems  Gastrointestinal:  Positive for abdominal pain, diarrhea and nausea. Negative for vomiting.  All other systems reviewed and are negative.   Physical Exam Updated Vital Signs BP (!) 158/88   Pulse 89   Temp 98.6 F (37 C)   Resp 20   Ht 1.575 m ('5\' 2"'$ )   Wt 82.1 kg   SpO2 100%   BMI 33.11 kg/m  Physical Exam Vitals and nursing note reviewed.  Constitutional:      Appearance: She is well-developed. She is obese.     Comments: Chronically ill-appearing but nontoxic  HENT:     Head:  Normocephalic and atraumatic.  Eyes:     Pupils: Pupils are equal, round, and reactive to light.  Cardiovascular:     Rate and Rhythm: Normal rate and regular rhythm.     Heart sounds: Normal heart sounds.     Comments: Fistula left forearm with positive thrill Pulmonary:     Effort: Pulmonary effort is normal. No respiratory distress.     Breath sounds: No wheezing.  Abdominal:     General: Bowel sounds are normal.     Palpations: Abdomen is soft.     Tenderness: There is no guarding or rebound.  Musculoskeletal:     Cervical back: Neck supple.  Skin:    General: Skin is warm and dry.  Neurological:     Mental Status: She is alert and oriented to person, place, and time.  Psychiatric:        Mood and Affect: Mood normal.     ED Results / Procedures / Treatments   Labs (all labs ordered are listed, but only abnormal results are displayed) Labs Reviewed  C DIFFICILE QUICK SCREEN W PCR REFLEX    GASTROINTESTINAL PANEL BY PCR, STOOL (REPLACES STOOL CULTURE)  CBC WITH DIFFERENTIAL/PLATELET  COMPREHENSIVE METABOLIC PANEL    EKG None  Radiology No results found.  Procedures Procedures  {Document cardiac monitor, telemetry assessment procedure when appropriate:1}  Medications Ordered in ED Medications  dicyclomine (BENTYL) capsule 10 mg (has no administration in time range)    ED Course/ Medical Decision Making/ A&P                           Medical Decision Making Amount and/or Complexity of Data Reviewed Labs: ordered.  Risk Prescription drug management.   ***  {Document critical care time when appropriate:1} {Document review of labs and clinical decision tools ie heart score, Chads2Vasc2 etc:1}  {Document your independent review of radiology images, and any outside records:1} {Document your discussion with family members, caretakers, and with consultants:1} {Document social determinants of health affecting pt's care:1} {Document your decision making why  or why not admission, treatments were needed:1} Final Clinical Impression(s) / ED Diagnoses Final diagnoses:  None    Rx / DC Orders ED Discharge Orders     None

## 2022-01-27 NOTE — ED Triage Notes (Signed)
Pt had several bouts of diarrhea today with mid ABD pain.  Pt took Immodium but did not help.

## 2022-01-28 DIAGNOSIS — R197 Diarrhea, unspecified: Secondary | ICD-10-CM | POA: Diagnosis not present

## 2022-01-28 LAB — CBC WITH DIFFERENTIAL/PLATELET
Abs Immature Granulocytes: 0.04 10*3/uL (ref 0.00–0.07)
Basophils Absolute: 0.1 10*3/uL (ref 0.0–0.1)
Basophils Relative: 1 %
Eosinophils Absolute: 0.1 10*3/uL (ref 0.0–0.5)
Eosinophils Relative: 1 %
HCT: 33 % — ABNORMAL LOW (ref 36.0–46.0)
Hemoglobin: 10.6 g/dL — ABNORMAL LOW (ref 12.0–15.0)
Immature Granulocytes: 0 %
Lymphocytes Relative: 17 %
Lymphs Abs: 1.8 10*3/uL (ref 0.7–4.0)
MCH: 33.4 pg (ref 26.0–34.0)
MCHC: 32.1 g/dL (ref 30.0–36.0)
MCV: 104.1 fL — ABNORMAL HIGH (ref 80.0–100.0)
Monocytes Absolute: 1.2 10*3/uL — ABNORMAL HIGH (ref 0.1–1.0)
Monocytes Relative: 11 %
Neutro Abs: 7.2 10*3/uL (ref 1.7–7.7)
Neutrophils Relative %: 70 %
Platelets: 291 10*3/uL (ref 150–400)
RBC: 3.17 MIL/uL — ABNORMAL LOW (ref 3.87–5.11)
RDW: 15.1 % (ref 11.5–15.5)
WBC: 10.4 10*3/uL (ref 4.0–10.5)
nRBC: 0 % (ref 0.0–0.2)

## 2022-01-28 LAB — COMPREHENSIVE METABOLIC PANEL
ALT: 20 U/L (ref 0–44)
AST: 18 U/L (ref 15–41)
Albumin: 3.8 g/dL (ref 3.5–5.0)
Alkaline Phosphatase: 87 U/L (ref 38–126)
Anion gap: 12 (ref 5–15)
BUN: 29 mg/dL — ABNORMAL HIGH (ref 6–20)
CO2: 30 mmol/L (ref 22–32)
Calcium: 9.2 mg/dL (ref 8.9–10.3)
Chloride: 98 mmol/L (ref 98–111)
Creatinine, Ser: 7.38 mg/dL — ABNORMAL HIGH (ref 0.44–1.00)
GFR, Estimated: 6 mL/min — ABNORMAL LOW (ref >60)
Glucose, Bld: 112 mg/dL — ABNORMAL HIGH (ref 70–99)
Potassium: 3.8 mmol/L (ref 3.5–5.1)
Sodium: 140 mmol/L (ref 135–145)
Total Bilirubin: 0.8 mg/dL (ref 0.3–1.2)
Total Protein: 7.9 g/dL (ref 6.5–8.1)

## 2022-01-28 NOTE — Discharge Instructions (Signed)
You were seen today for diarrhea.  Your work-up was reassuring.  Continue take Imodium as needed.  Make sure that you are staying hydrated.

## 2022-01-28 NOTE — ED Notes (Signed)
Pt attempted to provide stool sample. Was unable to at this time.

## 2022-01-28 NOTE — ED Notes (Signed)
Pt passed po challenge. No problem holding down fluids.

## 2022-01-28 NOTE — ED Notes (Signed)
Pt unable to provide stool sample. Provider made aware.

## 2022-01-30 DIAGNOSIS — E119 Type 2 diabetes mellitus without complications: Secondary | ICD-10-CM | POA: Diagnosis not present

## 2022-01-30 DIAGNOSIS — N2581 Secondary hyperparathyroidism of renal origin: Secondary | ICD-10-CM | POA: Diagnosis not present

## 2022-01-30 DIAGNOSIS — N186 End stage renal disease: Secondary | ICD-10-CM | POA: Diagnosis not present

## 2022-01-30 DIAGNOSIS — D631 Anemia in chronic kidney disease: Secondary | ICD-10-CM | POA: Diagnosis not present

## 2022-01-30 DIAGNOSIS — E1129 Type 2 diabetes mellitus with other diabetic kidney complication: Secondary | ICD-10-CM | POA: Diagnosis not present

## 2022-01-30 DIAGNOSIS — Z992 Dependence on renal dialysis: Secondary | ICD-10-CM | POA: Diagnosis not present

## 2022-01-31 ENCOUNTER — Telehealth: Payer: Self-pay

## 2022-01-31 NOTE — Telephone Encounter (Signed)
Pt called in stating that she was hoping to possibly get an at home stool sample test. Pt was seen in the e/r recently and wasn't able to provide a stool sample and was asked to possibly get a kit from pcp to check for cdiff. Pt wanted to know if she could send her husband to pick up kit and bring sample into office and then have a f/u appt.? Please advise.  Cb#: [phone]

## 2022-01-31 NOTE — Telephone Encounter (Signed)
She will need a post ER visit with Dr. Dennard Schaumann, so she can just get a stool card then and take it home.   Pls call and make an OV for her.

## 2022-02-01 DIAGNOSIS — Z992 Dependence on renal dialysis: Secondary | ICD-10-CM | POA: Diagnosis not present

## 2022-02-01 DIAGNOSIS — E1129 Type 2 diabetes mellitus with other diabetic kidney complication: Secondary | ICD-10-CM | POA: Diagnosis not present

## 2022-02-01 DIAGNOSIS — N2581 Secondary hyperparathyroidism of renal origin: Secondary | ICD-10-CM | POA: Diagnosis not present

## 2022-02-01 DIAGNOSIS — D631 Anemia in chronic kidney disease: Secondary | ICD-10-CM | POA: Diagnosis not present

## 2022-02-01 DIAGNOSIS — E119 Type 2 diabetes mellitus without complications: Secondary | ICD-10-CM | POA: Diagnosis not present

## 2022-02-01 DIAGNOSIS — N186 End stage renal disease: Secondary | ICD-10-CM | POA: Diagnosis not present

## 2022-02-02 ENCOUNTER — Other Ambulatory Visit: Payer: Medicare Other

## 2022-02-03 ENCOUNTER — Other Ambulatory Visit: Payer: Medicare Other

## 2022-02-03 DIAGNOSIS — E1129 Type 2 diabetes mellitus with other diabetic kidney complication: Secondary | ICD-10-CM | POA: Diagnosis not present

## 2022-02-03 DIAGNOSIS — N2581 Secondary hyperparathyroidism of renal origin: Secondary | ICD-10-CM | POA: Diagnosis not present

## 2022-02-03 DIAGNOSIS — E119 Type 2 diabetes mellitus without complications: Secondary | ICD-10-CM | POA: Diagnosis not present

## 2022-02-03 DIAGNOSIS — N186 End stage renal disease: Secondary | ICD-10-CM | POA: Diagnosis not present

## 2022-02-03 DIAGNOSIS — A0472 Enterocolitis due to Clostridium difficile, not specified as recurrent: Secondary | ICD-10-CM

## 2022-02-03 DIAGNOSIS — Z992 Dependence on renal dialysis: Secondary | ICD-10-CM | POA: Diagnosis not present

## 2022-02-03 DIAGNOSIS — D631 Anemia in chronic kidney disease: Secondary | ICD-10-CM | POA: Diagnosis not present

## 2022-02-04 ENCOUNTER — Other Ambulatory Visit: Payer: Self-pay | Admitting: Family Medicine

## 2022-02-06 DIAGNOSIS — E119 Type 2 diabetes mellitus without complications: Secondary | ICD-10-CM | POA: Diagnosis not present

## 2022-02-06 DIAGNOSIS — N2581 Secondary hyperparathyroidism of renal origin: Secondary | ICD-10-CM | POA: Diagnosis not present

## 2022-02-06 DIAGNOSIS — E1129 Type 2 diabetes mellitus with other diabetic kidney complication: Secondary | ICD-10-CM | POA: Diagnosis not present

## 2022-02-06 DIAGNOSIS — Z992 Dependence on renal dialysis: Secondary | ICD-10-CM | POA: Diagnosis not present

## 2022-02-06 DIAGNOSIS — D631 Anemia in chronic kidney disease: Secondary | ICD-10-CM | POA: Diagnosis not present

## 2022-02-06 DIAGNOSIS — N186 End stage renal disease: Secondary | ICD-10-CM | POA: Diagnosis not present

## 2022-02-06 LAB — C. DIFFICILE GDH AND TOXIN A/B
GDH ANTIGEN: NOT DETECTED
MICRO NUMBER:: 13566030
SPECIMEN QUALITY:: ADEQUATE
TOXIN A AND B: NOT DETECTED

## 2022-02-07 ENCOUNTER — Telehealth: Payer: Self-pay

## 2022-02-08 DIAGNOSIS — E119 Type 2 diabetes mellitus without complications: Secondary | ICD-10-CM | POA: Diagnosis not present

## 2022-02-08 DIAGNOSIS — D631 Anemia in chronic kidney disease: Secondary | ICD-10-CM | POA: Diagnosis not present

## 2022-02-08 DIAGNOSIS — E1129 Type 2 diabetes mellitus with other diabetic kidney complication: Secondary | ICD-10-CM | POA: Diagnosis not present

## 2022-02-08 DIAGNOSIS — N186 End stage renal disease: Secondary | ICD-10-CM | POA: Diagnosis not present

## 2022-02-08 DIAGNOSIS — N2581 Secondary hyperparathyroidism of renal origin: Secondary | ICD-10-CM | POA: Diagnosis not present

## 2022-02-08 DIAGNOSIS — Z992 Dependence on renal dialysis: Secondary | ICD-10-CM | POA: Diagnosis not present

## 2022-02-08 NOTE — Telephone Encounter (Signed)
Return call to pt, told that don't have the results yet. As soon as we get it, that I will call her back.   Pt voiced understanding and nothing at this time.

## 2022-02-10 DIAGNOSIS — D631 Anemia in chronic kidney disease: Secondary | ICD-10-CM | POA: Diagnosis not present

## 2022-02-10 DIAGNOSIS — E1129 Type 2 diabetes mellitus with other diabetic kidney complication: Secondary | ICD-10-CM | POA: Diagnosis not present

## 2022-02-10 DIAGNOSIS — Z992 Dependence on renal dialysis: Secondary | ICD-10-CM | POA: Diagnosis not present

## 2022-02-10 DIAGNOSIS — N186 End stage renal disease: Secondary | ICD-10-CM | POA: Diagnosis not present

## 2022-02-10 DIAGNOSIS — E119 Type 2 diabetes mellitus without complications: Secondary | ICD-10-CM | POA: Diagnosis not present

## 2022-02-10 DIAGNOSIS — N2581 Secondary hyperparathyroidism of renal origin: Secondary | ICD-10-CM | POA: Diagnosis not present

## 2022-02-11 DIAGNOSIS — N186 End stage renal disease: Secondary | ICD-10-CM | POA: Diagnosis not present

## 2022-02-11 DIAGNOSIS — N040 Nephrotic syndrome with minor glomerular abnormality: Secondary | ICD-10-CM | POA: Diagnosis not present

## 2022-02-11 DIAGNOSIS — Z992 Dependence on renal dialysis: Secondary | ICD-10-CM | POA: Diagnosis not present

## 2022-02-13 DIAGNOSIS — D509 Iron deficiency anemia, unspecified: Secondary | ICD-10-CM | POA: Diagnosis not present

## 2022-02-13 DIAGNOSIS — E1129 Type 2 diabetes mellitus with other diabetic kidney complication: Secondary | ICD-10-CM | POA: Diagnosis not present

## 2022-02-13 DIAGNOSIS — D631 Anemia in chronic kidney disease: Secondary | ICD-10-CM | POA: Diagnosis not present

## 2022-02-13 DIAGNOSIS — E119 Type 2 diabetes mellitus without complications: Secondary | ICD-10-CM | POA: Diagnosis not present

## 2022-02-13 DIAGNOSIS — N186 End stage renal disease: Secondary | ICD-10-CM | POA: Diagnosis not present

## 2022-02-13 DIAGNOSIS — Z992 Dependence on renal dialysis: Secondary | ICD-10-CM | POA: Diagnosis not present

## 2022-02-13 DIAGNOSIS — N2581 Secondary hyperparathyroidism of renal origin: Secondary | ICD-10-CM | POA: Diagnosis not present

## 2022-02-16 DIAGNOSIS — E1129 Type 2 diabetes mellitus with other diabetic kidney complication: Secondary | ICD-10-CM | POA: Diagnosis not present

## 2022-02-16 DIAGNOSIS — D631 Anemia in chronic kidney disease: Secondary | ICD-10-CM | POA: Diagnosis not present

## 2022-02-16 DIAGNOSIS — Z992 Dependence on renal dialysis: Secondary | ICD-10-CM | POA: Diagnosis not present

## 2022-02-16 DIAGNOSIS — N186 End stage renal disease: Secondary | ICD-10-CM | POA: Diagnosis not present

## 2022-02-16 DIAGNOSIS — N2581 Secondary hyperparathyroidism of renal origin: Secondary | ICD-10-CM | POA: Diagnosis not present

## 2022-02-16 DIAGNOSIS — D509 Iron deficiency anemia, unspecified: Secondary | ICD-10-CM | POA: Diagnosis not present

## 2022-02-17 DIAGNOSIS — Z992 Dependence on renal dialysis: Secondary | ICD-10-CM | POA: Diagnosis not present

## 2022-02-17 DIAGNOSIS — D509 Iron deficiency anemia, unspecified: Secondary | ICD-10-CM | POA: Diagnosis not present

## 2022-02-17 DIAGNOSIS — E1129 Type 2 diabetes mellitus with other diabetic kidney complication: Secondary | ICD-10-CM | POA: Diagnosis not present

## 2022-02-17 DIAGNOSIS — N186 End stage renal disease: Secondary | ICD-10-CM | POA: Diagnosis not present

## 2022-02-17 DIAGNOSIS — N2581 Secondary hyperparathyroidism of renal origin: Secondary | ICD-10-CM | POA: Diagnosis not present

## 2022-02-17 DIAGNOSIS — D631 Anemia in chronic kidney disease: Secondary | ICD-10-CM | POA: Diagnosis not present

## 2022-02-20 DIAGNOSIS — D631 Anemia in chronic kidney disease: Secondary | ICD-10-CM | POA: Diagnosis not present

## 2022-02-20 DIAGNOSIS — Z992 Dependence on renal dialysis: Secondary | ICD-10-CM | POA: Diagnosis not present

## 2022-02-20 DIAGNOSIS — E1129 Type 2 diabetes mellitus with other diabetic kidney complication: Secondary | ICD-10-CM | POA: Diagnosis not present

## 2022-02-20 DIAGNOSIS — N186 End stage renal disease: Secondary | ICD-10-CM | POA: Diagnosis not present

## 2022-02-20 DIAGNOSIS — N2581 Secondary hyperparathyroidism of renal origin: Secondary | ICD-10-CM | POA: Diagnosis not present

## 2022-02-20 DIAGNOSIS — D509 Iron deficiency anemia, unspecified: Secondary | ICD-10-CM | POA: Diagnosis not present

## 2022-02-22 ENCOUNTER — Telehealth: Payer: Self-pay

## 2022-02-22 DIAGNOSIS — N186 End stage renal disease: Secondary | ICD-10-CM | POA: Diagnosis not present

## 2022-02-22 DIAGNOSIS — D631 Anemia in chronic kidney disease: Secondary | ICD-10-CM | POA: Diagnosis not present

## 2022-02-22 DIAGNOSIS — D509 Iron deficiency anemia, unspecified: Secondary | ICD-10-CM | POA: Diagnosis not present

## 2022-02-22 DIAGNOSIS — N2581 Secondary hyperparathyroidism of renal origin: Secondary | ICD-10-CM | POA: Diagnosis not present

## 2022-02-22 DIAGNOSIS — E1129 Type 2 diabetes mellitus with other diabetic kidney complication: Secondary | ICD-10-CM | POA: Diagnosis not present

## 2022-02-22 DIAGNOSIS — Z992 Dependence on renal dialysis: Secondary | ICD-10-CM | POA: Diagnosis not present

## 2022-02-22 NOTE — Telephone Encounter (Signed)
Pt called asking for her lab results 02/03/22 on her c-diff. Try to explain to pt that the report just show "final" but nothing specific as to if its positive or negative. So I would know what to tell her.(I was assumed that since nothing is detected that it is probably Negative") I did say that she has an appointment  on 03/02/22 w/ Dr. Dennard Schaumann and for pt to discuss it with him. Pt hung up the phone.   Please advice if it is considered negative so I can tell pt. Thank you.

## 2022-02-24 DIAGNOSIS — E1129 Type 2 diabetes mellitus with other diabetic kidney complication: Secondary | ICD-10-CM | POA: Diagnosis not present

## 2022-02-24 DIAGNOSIS — D509 Iron deficiency anemia, unspecified: Secondary | ICD-10-CM | POA: Diagnosis not present

## 2022-02-24 DIAGNOSIS — D631 Anemia in chronic kidney disease: Secondary | ICD-10-CM | POA: Diagnosis not present

## 2022-02-24 DIAGNOSIS — Z992 Dependence on renal dialysis: Secondary | ICD-10-CM | POA: Diagnosis not present

## 2022-02-24 DIAGNOSIS — N2581 Secondary hyperparathyroidism of renal origin: Secondary | ICD-10-CM | POA: Diagnosis not present

## 2022-02-24 DIAGNOSIS — N186 End stage renal disease: Secondary | ICD-10-CM | POA: Diagnosis not present

## 2022-02-26 ENCOUNTER — Emergency Department (HOSPITAL_COMMUNITY): Payer: Medicare Other

## 2022-02-26 ENCOUNTER — Emergency Department (HOSPITAL_COMMUNITY)
Admission: EM | Admit: 2022-02-26 | Discharge: 2022-02-26 | Disposition: A | Discharge status: Home / Self Care | Payer: Medicare Other | Attending: Emergency Medicine | Admitting: Emergency Medicine

## 2022-02-26 ENCOUNTER — Encounter (HOSPITAL_COMMUNITY): Payer: Self-pay

## 2022-02-26 DIAGNOSIS — G43001 Migraine without aura, not intractable, with status migrainosus: Secondary | ICD-10-CM | POA: Diagnosis not present

## 2022-02-26 DIAGNOSIS — R0789 Other chest pain: Secondary | ICD-10-CM | POA: Diagnosis not present

## 2022-02-26 DIAGNOSIS — R42 Dizziness and giddiness: Secondary | ICD-10-CM | POA: Diagnosis not present

## 2022-02-26 DIAGNOSIS — Z992 Dependence on renal dialysis: Secondary | ICD-10-CM | POA: Diagnosis not present

## 2022-02-26 DIAGNOSIS — I700 Atherosclerosis of aorta: Secondary | ICD-10-CM | POA: Diagnosis not present

## 2022-02-26 DIAGNOSIS — Z79899 Other long term (current) drug therapy: Secondary | ICD-10-CM | POA: Diagnosis not present

## 2022-02-26 DIAGNOSIS — Z7982 Long term (current) use of aspirin: Secondary | ICD-10-CM | POA: Diagnosis not present

## 2022-02-26 LAB — BASIC METABOLIC PANEL
Anion gap: 14 (ref 5–15)
BUN: 40 mg/dL — ABNORMAL HIGH (ref 6–20)
CO2: 26 mmol/L (ref 22–32)
Calcium: 9.6 mg/dL (ref 8.9–10.3)
Chloride: 100 mmol/L (ref 98–111)
Creatinine, Ser: 11.17 mg/dL — ABNORMAL HIGH (ref 0.44–1.00)
GFR, Estimated: 4 mL/min — ABNORMAL LOW (ref >60)
Glucose, Bld: 115 mg/dL — ABNORMAL HIGH (ref 70–99)
Potassium: 4.4 mmol/L (ref 3.5–5.1)
Sodium: 140 mmol/L (ref 135–145)

## 2022-02-26 LAB — HEPATIC FUNCTION PANEL
ALT: 22 U/L (ref 0–44)
AST: 19 U/L (ref 15–41)
Albumin: 3.6 g/dL (ref 3.5–5.0)
Alkaline Phosphatase: 82 U/L (ref 38–126)
Bilirubin, Direct: 0.1 mg/dL (ref 0.0–0.2)
Indirect Bilirubin: 0.5 mg/dL (ref 0.3–0.9)
Total Bilirubin: 0.6 mg/dL (ref 0.3–1.2)
Total Protein: 7.5 g/dL (ref 6.5–8.1)

## 2022-02-26 LAB — TROPONIN I (HIGH SENSITIVITY)
Troponin I (High Sensitivity): 12 ng/L (ref <18)
Troponin I (High Sensitivity): 10 ng/L (ref <18)

## 2022-02-26 LAB — CBC
HCT: 34.8 % — ABNORMAL LOW (ref 36.0–46.0)
Hemoglobin: 11.1 g/dL — ABNORMAL LOW (ref 12.0–15.0)
MCH: 32.6 pg (ref 26.0–34.0)
MCHC: 31.9 g/dL (ref 30.0–36.0)
MCV: 102.4 fL — ABNORMAL HIGH (ref 80.0–100.0)
Platelets: 269 10*3/uL (ref 150–400)
RBC: 3.4 MIL/uL — ABNORMAL LOW (ref 3.87–5.11)
RDW: 15.6 % — ABNORMAL HIGH (ref 11.5–15.5)
WBC: 7.9 10*3/uL (ref 4.0–10.5)
nRBC: 0 % (ref 0.0–0.2)

## 2022-02-26 MED ORDER — MECLIZINE HCL 12.5 MG PO TABS
25.0000 mg | ORAL_TABLET | Freq: Once | ORAL | Status: AC
  Administered 2022-02-26: 25 mg via ORAL
  Filled 2022-02-26: qty 2

## 2022-02-26 MED ORDER — ONDANSETRON 4 MG PO TBDP
4.0000 mg | ORAL_TABLET | Freq: Once | ORAL | Status: AC
  Administered 2022-02-26: 4 mg via ORAL
  Filled 2022-02-26: qty 1

## 2022-02-26 MED ORDER — HYDROCODONE-ACETAMINOPHEN 5-325 MG PO TABS
1.0000 | ORAL_TABLET | Freq: Once | ORAL | Status: AC
  Administered 2022-02-26: 1 via ORAL
  Filled 2022-02-26: qty 1

## 2022-02-26 NOTE — ED Triage Notes (Signed)
Pt states that she began having midsternal, non-radiating CP with SOB approximately two hours PTA. Pt also c/o migraine and dizziness, states these symptoms are similar to her hx of vertigo.

## 2022-02-26 NOTE — Discharge Instructions (Addendum)
Take your hydrocodone for your headaches and your Antivert for your dizziness.  We will refer you to cardiology for your chest discomfort

## 2022-02-26 NOTE — ED Provider Notes (Signed)
[facility] EMERGENCY DEPARTMENT Provider Note   CSN: [id number] Arrival date & time: 02/26/22  3149     History  Chief Complaint  Patient presents with   Chest Pain   Migraine    Megan [name] is a 61 y.o. female.  Patient complains of some dizziness and a headache and some chest pain.  Patient has a history of migraines and vertigo she is also a dialysis patient  The history is provided by the patient and medical records. No language interpreter was used.  Chest Pain Pain location:  L chest Pain quality: aching   Pain severity:  Mild Onset quality:  Sudden Timing:  Intermittent Progression:  Resolved Chronicity:  Recurrent Context: not breathing   Associated symptoms: headache   Associated symptoms: no abdominal pain, no back pain, no cough and no fatigue   Migraine Associated symptoms include chest pain and headaches. Pertinent negatives include no abdominal pain.       Home Medications Prior to Admission medications   Medication Sig Start Date End Date Taking? Authorizing Provider  amLODipine (NORVASC) 10 MG tablet TAKE 1 TABLET BY MOUTH AT BEDTIME 02/06/22  Yes [name], MD  anastrozole (ARIMIDEX) 1 MG tablet TAKE 1 TABLET(1 MG) BY MOUTH DAILY Patient taking differently: Take 1 mg by mouth daily. 08/19/21  Yes Derek Jack, MD  aspirin EC 81 MG tablet Take 81 mg by mouth daily.   Yes [provider]  atorvastatin (LIPITOR) 20 MG tablet TAKE 1 TABLET DAILY (NEEDS OFFICE VISIT) Patient taking differently: Take 20 mg by mouth daily. 06/13/21  Yes [name], MD  B Complex-C-Folic Acid (DIALYVITE 702) 0.8 MG TABS Take 1 tablet by mouth daily. 01/22/18  Yes [provider]  cinacalcet (SENSIPAR) 30 MG tablet Take 2 tablets (60 mg total) by mouth daily. Patient taking differently: Take 30 mg by mouth daily. 06/13/18  Yes [name], MD  Darbepoetin Alfa (ARANESP, ALBUMIN FREE, IJ) Take once a week with dialysis unsure  of dose 07/01/21 06/30/22 Yes [provider]  DULoxetine (CYMBALTA) 30 MG capsule TAKE 1 CAPSULE(30 MG) BY MOUTH DAILY Patient taking differently: Take 30 mg by mouth daily. 08/19/21  Yes [name], MD  gabapentin (NEURONTIN) 300 MG capsule TAKE 1 CAPSULE(300 MG) BY MOUTH AT BEDTIME Patient taking differently: Take 300 mg by mouth at bedtime. 01/10/22  Yes [name], MD  HYDROcodone-acetaminophen (NORCO/VICODIN) 5-325 MG tablet Take 1 tablet by mouth every 6 (six) hours as needed. 01/13/22  Yes [name], MD  isosorbide mononitrate (IMDUR) 30 MG 24 hr tablet Take 0.5 tablets (15 mg total) by mouth daily. 12/09/21  Yes Branch, [name], MD  lanthanum (FOSRENOL) 1000 MG chewable tablet Chew 2,000-3,000 mg by mouth See admin instructions. Take 3 tablets (3000 mg) by mouth with meals and take 2 tablets (2000 mg) with snacks 10/16/19  Yes [provider]  lidocaine-prilocaine (EMLA) cream Apply 1 application  topically every Monday, Wednesday, and Friday with hemodialysis. 09/24/18  Yes [provider]  meclizine (ANTIVERT) 25 MG tablet Take 1 tablet (25 mg total) by mouth 3 (three) times daily as needed for dizziness. 11/08/21  Yes [name], MD  nebivolol (BYSTOLIC) 10 MG tablet Take 1 tablet (10 mg total) by mouth daily. Patient taking differently: Take 10 mg by mouth in the morning and at bedtime. 11/08/21  Yes [name], MD  pantoprazole (PROTONIX) 40 MG tablet TAKE 1 TABLET(40 MG) BY MOUTH DAILY  Patient taking differently: Take 40 mg by mouth daily. 01/20/22  Yes [name], MD  VITAMIN D PO Take by mouth. 08/24/21 08/23/22 Yes [provider]      Allergies    Patient has no known allergies.    Review of Systems   Review of Systems  Constitutional:  Negative for appetite change and fatigue.  HENT:  Negative for congestion, ear discharge and sinus pressure.   Eyes:  Negative for discharge.  Respiratory:  Negative for  cough.   Cardiovascular:  Positive for chest pain.  Gastrointestinal:  Negative for abdominal pain and diarrhea.  Genitourinary:  Negative for frequency and hematuria.  Musculoskeletal:  Negative for back pain.  Skin:  Negative for rash.  Neurological:  Positive for headaches. Negative for seizures.  Psychiatric/Behavioral:  Negative for hallucinations.     Physical Exam Updated Vital Signs BP (!) 149/83   Pulse 73   Temp 98.2 F (36.8 C)   Resp 16   Ht '5\' 2"'$  (1.575 m)   Wt 84.4 kg   SpO2 90%   BMI 34.02 kg/m  Physical Exam Vitals and nursing note reviewed.  Constitutional:      Appearance: She is well-developed.  HENT:     Head: Normocephalic.     Nose: Nose normal.  Eyes:     General: No scleral icterus.    Conjunctiva/sclera: Conjunctivae normal.  Neck:     Thyroid: No thyromegaly.  Cardiovascular:     Rate and Rhythm: Normal rate and regular rhythm.     Heart sounds: No murmur heard.    No friction rub. No gallop.  Pulmonary:     Breath sounds: No stridor. No wheezing or rales.  Chest:     Chest wall: No tenderness.  Abdominal:     General: There is no distension.     Tenderness: There is no abdominal tenderness. There is no rebound.  Musculoskeletal:        General: Normal range of motion.     Cervical back: Neck supple.  Lymphadenopathy:     Cervical: No cervical adenopathy.  Skin:    Findings: No erythema or rash.  Neurological:     Mental Status: She is alert and oriented to person, place, and time.     Motor: No abnormal muscle tone.     Coordination: Coordination normal.  Psychiatric:        Behavior: Behavior normal.     ED Results / Procedures / Treatments   Labs (all labs ordered are listed, but only abnormal results are displayed) Labs Reviewed  BASIC METABOLIC PANEL - Abnormal; Notable for the following components:      Result Value   Glucose, Bld 115 (*)    BUN 40 (*)    Creatinine, Ser 11.17 (*)    GFR, Estimated 4 (*)    All  other components within normal limits  CBC - Abnormal; Notable for the following components:   RBC 3.40 (*)    Hemoglobin 11.1 (*)    HCT 34.8 (*)    MCV 102.4 (*)    RDW 15.6 (*)    All other components within normal limits  HEPATIC FUNCTION PANEL  TROPONIN I (HIGH SENSITIVITY)  TROPONIN I (HIGH SENSITIVITY)    EKG EKG Interpretation  Date/Time:  Sunday February 26 2022 09:37:04 EDT Ventricular Rate:  79 PR Interval:  157 QRS Duration: 92 QT Interval:  434 QTC Calculation: 498 R Axis:   -5 Text Interpretation: Sinus rhythm Probable  left atrial enlargement Probable left ventricular hypertrophy ST elevation, consider inferior injury Borderline prolonged QT interval Baseline wander in lead(s) II III aVF V1 V4 V6 Confirmed by Milton Ferguson [phone]) on 02/26/2022 10:00:40 AM  Radiology DG Chest Port 1 View  Result Date: 02/26/2022 CLINICAL DATA:  61 year old female with history of chest pain, chest tightness and dizziness. EXAM: PORTABLE CHEST 1 VIEW COMPARISON:  Chest x-ray 11/17/2021. FINDINGS: Lung volumes are normal. No consolidative airspace disease. No pleural effusions. No pneumothorax. No pulmonary nodule or mass noted. Pulmonary vasculature and the cardiomediastinal silhouette are within normal limits. Atherosclerosis in the thoracic aorta. Surgical clips in the right axillary region, probably from likely from lymph node dissection. IMPRESSION: 1. No radiographic evidence of acute cardiopulmonary disease. 2. Aortic atherosclerosis. Electronically Signed   By: Vinnie Langton M.D.   On: 02/26/2022 10:18    Procedures Procedures    Medications Ordered in ED Medications  meclizine (ANTIVERT) tablet 25 mg (25 mg Oral Given 02/26/22 1013)  HYDROcodone-acetaminophen (NORCO/VICODIN) 5-325 MG per tablet 1 tablet (1 tablet Oral Given 02/26/22 1013)  ondansetron (ZOFRAN-ODT) disintegrating tablet 4 mg (4 mg Oral Given 02/26/22 1013)    ED Course/ Medical Decision Making/ A&P                            Medical Decision Making Amount and/or Complexity of Data Reviewed Labs: ordered. Radiology: ordered.  Risk Prescription drug management.  This patient presents to the ED for concern of chest pain and headache, this involves an extensive number of treatment options, and is a complaint that carries with it a high risk of complications and morbidity.  The differential diagnosis includes MI and migraine   Co morbidities that complicate the patient evaluation  Kidney failure and migraine   Additional history obtained:  Additional history obtained from family External records from outside source obtained and reviewed including hospital record   Lab Tests:  I Ordered, and personally interpreted labs.  The pertinent results include: Chemistry shows kidney failure which is chronic, hemoglobin low at 11.1   Imaging Studies ordered:  I ordered imaging studies including chest x-ray I independently visualized and interpreted imaging which showed no I agree with the radiologist interpretation   Cardiac Monitoring: / EKG:  The patient was maintained on a cardiac monitor.  I personally viewed and interpreted the cardiac monitored which showed an underlying rhythm of: Normal sinus rhythm   Consultations Obtained:  no consult Problem List / ED Course / Critical interventions / Medication management  Chest pain, migraine, kidney failure I ordered medication including Zofran and hydrocodone Reevaluation of the patient after these medicines showed that the patient improved I have reviewed the patients home medicines and have made adjustments as needed   Social Determinants of Health:  Dialysis patient   Test / Admission - Considered:  No additional test  Patient's migraine and vertigo improved with Antivert and hydrocodone which she has at home but did not take.  Her chest pain has improved and her troponins were negative with an EKG that was unremarkable.  She  will be discharged home to follow-up with cardiology and her family doctor if necessary        Final Clinical Impression(s) / ED Diagnoses Final diagnoses:  Atypical chest pain  Migraine without aura and with status migrainosus, not intractable  Vertigo    Rx / DC Orders ED Discharge Orders     None  Milton Ferguson, MD 02/26/22 1731

## 2022-02-27 DIAGNOSIS — N2581 Secondary hyperparathyroidism of renal origin: Secondary | ICD-10-CM | POA: Diagnosis not present

## 2022-02-27 DIAGNOSIS — E1129 Type 2 diabetes mellitus with other diabetic kidney complication: Secondary | ICD-10-CM | POA: Diagnosis not present

## 2022-02-27 DIAGNOSIS — D509 Iron deficiency anemia, unspecified: Secondary | ICD-10-CM | POA: Diagnosis not present

## 2022-02-27 DIAGNOSIS — N186 End stage renal disease: Secondary | ICD-10-CM | POA: Diagnosis not present

## 2022-02-27 DIAGNOSIS — Z992 Dependence on renal dialysis: Secondary | ICD-10-CM | POA: Diagnosis not present

## 2022-02-27 DIAGNOSIS — D631 Anemia in chronic kidney disease: Secondary | ICD-10-CM | POA: Diagnosis not present

## 2022-02-28 ENCOUNTER — Other Ambulatory Visit (HOSPITAL_COMMUNITY): Payer: Self-pay | Admitting: Hematology

## 2022-02-28 ENCOUNTER — Ambulatory Visit: Payer: Medicare Other | Admitting: Family Medicine

## 2022-02-28 ENCOUNTER — Other Ambulatory Visit (HOSPITAL_COMMUNITY): Payer: Self-pay | Admitting: *Deleted

## 2022-02-28 DIAGNOSIS — Z992 Dependence on renal dialysis: Secondary | ICD-10-CM | POA: Diagnosis not present

## 2022-02-28 DIAGNOSIS — Z170 Estrogen receptor positive status [ER+]: Secondary | ICD-10-CM

## 2022-02-28 DIAGNOSIS — N186 End stage renal disease: Secondary | ICD-10-CM | POA: Diagnosis not present

## 2022-02-28 NOTE — Telephone Encounter (Signed)
Anastrozole refill approved.  Per last ovn, patient is tolerating and is to continue therapy.

## 2022-03-01 ENCOUNTER — Telehealth: Payer: Self-pay

## 2022-03-01 DIAGNOSIS — E119 Type 2 diabetes mellitus without complications: Secondary | ICD-10-CM | POA: Diagnosis not present

## 2022-03-01 DIAGNOSIS — D509 Iron deficiency anemia, unspecified: Secondary | ICD-10-CM | POA: Diagnosis not present

## 2022-03-01 DIAGNOSIS — N186 End stage renal disease: Secondary | ICD-10-CM | POA: Diagnosis not present

## 2022-03-01 DIAGNOSIS — E1129 Type 2 diabetes mellitus with other diabetic kidney complication: Secondary | ICD-10-CM | POA: Diagnosis not present

## 2022-03-01 DIAGNOSIS — D631 Anemia in chronic kidney disease: Secondary | ICD-10-CM | POA: Diagnosis not present

## 2022-03-01 DIAGNOSIS — Z992 Dependence on renal dialysis: Secondary | ICD-10-CM | POA: Diagnosis not present

## 2022-03-01 DIAGNOSIS — E039 Hypothyroidism, unspecified: Secondary | ICD-10-CM | POA: Diagnosis not present

## 2022-03-01 DIAGNOSIS — N2581 Secondary hyperparathyroidism of renal origin: Secondary | ICD-10-CM | POA: Diagnosis not present

## 2022-03-01 NOTE — Addendum Note (Signed)
Addended by: Colman Cater on: 03/01/2022 10:30 AM   Modules accepted: Orders

## 2022-03-01 NOTE — Telephone Encounter (Signed)
Pt called in requesting a refill of atorvastatin (LIPITOR) 20 MG tablet [[id number]]. Pt has an ov scheduled for 7/20. Please advise   Cb#: [phone]

## 2022-03-01 NOTE — Telephone Encounter (Signed)
Pt has an appointment tom. W/Dr. Dennard Schaumann, will decide.

## 2022-03-02 ENCOUNTER — Ambulatory Visit (INDEPENDENT_AMBULATORY_CARE_PROVIDER_SITE_OTHER): Payer: Medicare Other | Admitting: Family Medicine

## 2022-03-02 VITALS — BP 140/78 | HR 94 | Temp 98.1°F | Ht 62.0 in | Wt 180.0 lb

## 2022-03-02 DIAGNOSIS — R0789 Other chest pain: Secondary | ICD-10-CM

## 2022-03-02 DIAGNOSIS — E7800 Pure hypercholesterolemia, unspecified: Secondary | ICD-10-CM | POA: Diagnosis not present

## 2022-03-02 DIAGNOSIS — R42 Dizziness and giddiness: Secondary | ICD-10-CM

## 2022-03-02 MED ORDER — SCOPOLAMINE 1 MG/3DAYS TD PT72: 1.0000 | MEDICATED_PATCH | TRANSDERMAL | 12 refills | Status: DC

## 2022-03-02 MED ORDER — ATORVASTATIN CALCIUM 20 MG PO TABS: 20.0000 mg | ORAL_TABLET | Freq: Every day | ORAL | 3 refills | Status: DC

## 2022-03-02 NOTE — Progress Notes (Signed)
Subjective:    Patient ID: Megan [name], female    DOB: 26-Jun-1961, 61 y.o.   MRN: [id number] Patient is a 61 year old African-American female who presents today complaining of dizziness and chest pain and for refill of her Lipitor.  She went to the emergency room recently due to dizziness and chest pain.  She states that her chest pain has been present for years.  She saw cardiology who started her on Imdur 15 mg daily due to chronic chest pain.  She had a normal nonobstructive catheterization in 2020 and has also had several negative ischemic work-ups.  Radiology felt that the patient may be having vasospasms causing chest pain.  She initially responded to the isosorbide and was pain-free for 2 months.  However, recently she developed chest pain and dizziness.  She is having the chest pain on a daily basis.  It is nonexertional.  She denies any shortness of breath.  There is no exacerbating or alleviating factors.  She also complains of vertigo.  Whenever she stands and walks the room begins to spin despite taking meclizine 25 mg 3 times daily she denies any reflux.  She denies any melena.  She denies any hematochezia.  She denies any heartburn Past Medical History:  Diagnosis Date   (HFpEF) heart failure with preserved ejection fraction ([place])    a. 01/2019 Echo: EF 55-60%, mild conc LVH. DD.  Torn MV chordae.   Anemia    Atypical chest pain    a. 08/2018 MV: EF 59%, no ischemia; b. 02/2019 Cath: nonobs dzs.   Blood transfusion without reported diagnosis    Breast cancer ([place]) 10/12/2020   Cataract    ESRD (end stage renal disease) on dialysis [facility])    a. HD T, T, S   Essential hypertension, benign    GERD (gastroesophageal reflux disease)    Headache    Hemorrhoids    Mixed hyperlipidemia    Morbid obesity (HCC)    Non-obstructive CAD (coronary artery disease)    a. 02/2019 CathL LM nl, LAD 76m LCX nl, RCA 25p, EF 55-65%.   PONV (postoperative nausea and vomiting)    S/P colonoscopy  Jan 2011   Dr. 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    Type 2 diabetes mellitus ([place]    Wears glasses    Past Surgical History:  Procedure Laterality Date   ABDOMINAL HYSTERECTOMY     APPENDECTOMY     ARTERY BIOPSY N/A 05/09/2018   Procedure: RIGHT TEMPORAL ARTERY BIOPSY;  Surgeon: WJudeth Horn MD;  [place]  Service: General;  Laterality: N/A;   BREAST BIOPSY Right 06/15/2020   Procedure: RIGHT BREAST BIOPSY;  Surgeon: JAviva Signs MD;  [facility]: [facility];  Service: General;  Laterality: Right;   CATARACT EXTRACTION W/PHACO Left 02/09/2017   Procedure: CATARACT EXTRACTION PHACO AND INTRAOCULAR LENS PLACEMENT LEFT EYE;  Surgeon: HTonny Branch MD;  Location: [facility];  Service: Ophthalmology;  Laterality: Left;  CDE: 4.89   CATARACT EXTRACTION W/PHACO Right 06/04/2017   Procedure: CATARACT EXTRACTION PHACO AND INTRAOCULAR LENS PLACEMENT (IOC);  Surgeon: HTonny Branch MD;  Location: [facility];  Service: Ophthalmology;  Laterality: Right;  CDE: 4.12   CHOLECYSTECTOMY  09/29/2011   Procedure: LAPAROSCOPIC CHOLECYSTECTOMY;  Surgeon: MJamesetta So MD;  Location: [facility];  Service: General;  Laterality: N/A;   COLONOSCOPY  08/2009   Dr. HBenson [place] sessile polyp (benign lymphoid), large hemorrhoids, repeat 5-10 years  COLONOSCOPY N/A 06/12/2016   prominent hemorrhoids   COLONOSCOPY WITH PROPOFOL N/A 06/16/2021   Procedure: COLONOSCOPY WITH PROPOFOL;  Surgeon: Daneil Dolin, MD;  [facility];  Service: Endoscopy;  Laterality: N/A;  9:30am (dialysis pt)   ESOPHAGOGASTRODUODENOSCOPY  09/05/2011   NGE:[id number] hiatal hernia; remainder of exam normal. No explanation for patient's abdominal pain with today's examination   ESOPHAGOGASTRODUODENOSCOPY N/A 12/17/2013   Dr. [name]: gastric erythema, erosion, mild chronic inflammation on path    ESOPHAGOGASTRODUODENOSCOPY (EGD) WITH PROPOFOL N/A 06/16/2021   Procedure:  ESOPHAGOGASTRODUODENOSCOPY (EGD) WITH PROPOFOL;  Surgeon: Daneil Dolin, MD;  [facility];  Service: Endoscopy;  Laterality: N/A;   EXCISION OF BREAST BIOPSY Right 10/12/2020   Procedure: EXCISION OF RIGHT BREAST BIOPSY;  Surgeon: Aviva Signs, MD;  Location: [facility];  Service: General;  Laterality: Right;   LAPAROSCOPIC APPENDECTOMY  09/29/2011   Procedure: APPENDECTOMY LAPAROSCOPIC;  Surgeon: Jamesetta So, MD;  Location: [facility];  Service: General;;  incidental appendectomy   LEFT HEART CATH AND CORONARY ANGIOGRAPHY N/A 02/28/2019   Procedure: LEFT HEART CATH AND CORONARY ANGIOGRAPHY;  Surgeon: Jettie Booze, MD;  Location: [place] [facility];  Service: Cardiovascular;  Laterality: N/A;   MASTECTOMY MODIFIED RADICAL Right 02/18/2020   Procedure: MASTECTOMY MODIFIED RADICAL;  Surgeon: Aviva Signs, MD;  Location: [facility];  Service: General;  Laterality: Right;   MASTECTOMY, PARTIAL Right 07/13/2020   Procedure: RIGHT PARTIAL MASTECTOMY;  Surgeon: Aviva Signs, MD;  Location: [facility];  Service: General;  Laterality: Right;   PARTIAL MASTECTOMY WITH NEEDLE LOCALIZATION AND AXILLARY SENTINEL LYMPH NODE BX Right 09/18/2018   Procedure: RIGHT PARTIAL MASTECTOMY AFTER NEEDLE LOCALIZATION, SENTINEL LYMPH NODE BIOPSY RIGHT AXILLA;  Surgeon: Aviva Signs, MD;  Location: [facility];  Service: General;  Laterality: Right;   POLYPECTOMY  06/16/2021   Procedure: POLYPECTOMY;  Surgeon: Daneil Dolin, MD;  [facility];  Service: Endoscopy;;   SIMPLE MASTECTOMY WITH AXILLARY SENTINEL NODE BIOPSY Left 06/15/2020   Procedure: LEFT SIMPLE MASTECTOMY;  Surgeon: Aviva Signs, MD;  Location: [facility];  Service: General;  Laterality: Left;   Current Outpatient Medications on File Prior to Visit  Medication Sig Dispense Refill   amLODipine (NORVASC) 10 MG tablet TAKE 1 TABLET BY MOUTH AT BEDTIME 90 tablet 0   anastrozole (ARIMIDEX) 1 MG tablet TAKE 1 TABLET(1 MG) BY MOUTH DAILY 30 tablet  4   aspirin EC 81 MG tablet Take 81 mg by mouth daily.     B Complex-C-Folic Acid (DIALYVITE 132) 0.8 MG TABS Take 1 tablet by mouth daily.     cinacalcet (SENSIPAR) 30 MG tablet Take 2 tablets (60 mg total) by mouth daily. (Patient taking differently: Take 30 mg by mouth daily.) 90 tablet 0   Darbepoetin Alfa (ARANESP, ALBUMIN FREE, IJ) Take once a week with dialysis unsure of dose     DULoxetine (CYMBALTA) 30 MG capsule TAKE 1 CAPSULE(30 MG) BY MOUTH DAILY (Patient taking differently: Take 30 mg by mouth daily.) 90 capsule 1   gabapentin (NEURONTIN) 300 MG capsule TAKE 1 CAPSULE(300 MG) BY MOUTH AT BEDTIME (Patient taking differently: Take 300 mg by mouth at bedtime.) 90 capsule 1   HYDROcodone-acetaminophen (NORCO/VICODIN) 5-325 MG tablet Take 1 tablet by mouth every 6 (six) hours as needed. 20 tablet 0   isosorbide mononitrate (IMDUR) 30 MG 24 hr tablet Take 0.5 tablets (15 mg total) by mouth daily. 15 tablet 6   lanthanum (FOSRENOL) 1000 MG chewable tablet  Chew 2,000-3,000 mg by mouth See admin instructions. Take 3 tablets (3000 mg) by mouth with meals and take 2 tablets (2000 mg) with snacks     lidocaine-prilocaine (EMLA) cream Apply 1 application  topically every Monday, Wednesday, and Friday with hemodialysis.     meclizine (ANTIVERT) 25 MG tablet Take 1 tablet (25 mg total) by mouth 3 (three) times daily as needed for dizziness. 30 tablet 0   nebivolol (BYSTOLIC) 10 MG tablet Take 1 tablet (10 mg total) by mouth daily. (Patient taking differently: Take 10 mg by mouth in the morning and at bedtime.) 90 tablet 3   pantoprazole (PROTONIX) 40 MG tablet TAKE 1 TABLET(40 MG) BY MOUTH DAILY (Patient taking differently: Take 40 mg by mouth daily.) 30 tablet 0   VITAMIN D PO Take by mouth.     No current facility-administered medications on file prior to visit.   No Known Allergies Social History   Socioeconomic History   Marital status: Married    Spouse name: Not on file   Number of  children: Not on file   Years of education: Not on file   Highest education level: Not on file  Occupational History   Occupation: Merchandiser, retail: [employer]  Tobacco Use   Smoking status: Never   Smokeless tobacco: Never  Vaping Use   Vaping Use: Never used  Substance and Sexual Activity   Alcohol use: No   Drug use: No   Sexual activity: Yes    Birth control/protection: Surgical  Other Topics Concern   Not on file  Social History Narrative   Works at [employer] in [place].    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   Also has to get items from high shelves-causes achy pain in shoulder area      Married.   Children are grown, out of house.   Social Determinants of Health   Financial Resource Strain: Low Risk  (09/09/2021)   Overall Financial Resource Strain (CARDIA)    Difficulty of Paying Living Expenses: Not hard at all  Food Insecurity: No Food Insecurity (09/09/2021)   Hunger Vital Sign    Worried About Running Out of Food in the Last Year: Never true    Ran Out of Food in the Last Year: Never true  Transportation Needs: No Transportation Needs (09/09/2021)   PRAPARE - Hydrologist (Medical): No    Lack of Transportation (Non-Medical): No  Physical Activity: Inactive (09/09/2021)   Exercise Vital Sign    Days of Exercise per Week: 0 days    Minutes of Exercise per Session: 0 min  Stress: No Stress Concern Present (09/09/2021)   [place]    Feeling of Stress : Not at all  Social Connections: [place] (09/09/2021)   Social Connection and Isolation Panel [NHANES]    Frequency of Communication with Friends and Family: More than three times a week    Frequency of Social Gatherings with Friends and Family: More than three times a week    Attends Religious Services: More than 4 times per year    Active Member of [employer] or Organizations: Yes     Attends Archivist Meetings: More than 4 times per year    Marital Status: Married  Human resources officer Violence: Not At Risk (09/09/2021)   Humiliation, Afraid, Rape, and Kick questionnaire    Fear of  Current or Ex-Partner: No    Emotionally Abused: No    Physically Abused: No    Sexually Abused: No    Past Medical History:  Diagnosis Date   (HFpEF) heart failure with preserved ejection fraction ([place])    a. 01/2019 Echo: EF 55-60%, mild conc LVH. DD.  Torn MV chordae.   Anemia    Atypical chest pain    a. 08/2018 MV: EF 59%, no ischemia; b. 02/2019 Cath: nonobs dzs.   Blood transfusion without reported diagnosis    Breast cancer ([place]) 10/12/2020   Cataract    ESRD (end stage renal disease) on dialysis [facility])    a. HD T, T, S   Essential hypertension, benign    GERD (gastroesophageal reflux disease)    Headache    Hemorrhoids    Mixed hyperlipidemia    Morbid obesity (HCC)    Non-obstructive CAD (coronary artery disease)    a. 02/2019 CathL LM nl, LAD 31m LCX nl, RCA 25p, EF 55-65%.   PONV (postoperative nausea and vomiting)    S/P colonoscopy Jan 2011   Dr. 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 ([place]    Type 2 diabetes mellitus ([place]    Wears glasses    Past Surgical History:  Procedure Laterality Date   ABDOMINAL HYSTERECTOMY     APPENDECTOMY     ARTERY BIOPSY N/A 05/09/2018   Procedure: RIGHT TEMPORAL ARTERY BIOPSY;  Surgeon: WJudeth Horn MD;  [place]  Service: General;  Laterality: N/A;   BREAST BIOPSY Right 06/15/2020   Procedure: RIGHT BREAST BIOPSY;  Surgeon: JAviva Signs MD;  [facility]: [facility];  Service: General;  Laterality: Right;   CATARACT EXTRACTION W/PHACO Left 02/09/2017   Procedure: CATARACT EXTRACTION PHACO AND INTRAOCULAR LENS PLACEMENT LEFT EYE;  Surgeon: HTonny Branch MD;  Location: [facility];  Service: Ophthalmology;  Laterality: Left;  CDE: 4.89   CATARACT EXTRACTION W/PHACO Right  06/04/2017   Procedure: CATARACT EXTRACTION PHACO AND INTRAOCULAR LENS PLACEMENT (IOC);  Surgeon: HTonny Branch MD;  Location: [facility];  Service: Ophthalmology;  Laterality: Right;  CDE: 4.12   CHOLECYSTECTOMY  09/29/2011   Procedure: LAPAROSCOPIC CHOLECYSTECTOMY;  Surgeon: MJamesetta So MD;  Location: [facility];  Service: General;  Laterality: N/A;   COLONOSCOPY  08/2009   Dr. HBenson [place] sessile polyp (benign lymphoid), large hemorrhoids, repeat 5-10 years   COLONOSCOPY N/A 06/12/2016   prominent hemorrhoids   COLONOSCOPY WITH PROPOFOL N/A 06/16/2021   Procedure: COLONOSCOPY WITH PROPOFOL;  Surgeon: RDaneil Dolin MD;  [facility];  Service: Endoscopy;  Laterality: N/A;  9:30am (dialysis pt)   ESOPHAGOGASTRODUODENOSCOPY  09/05/2011   RCNO:[id number] hernia; remainder of exam normal. No explanation for patient's abdominal pain with today's examination   ESOPHAGOGASTRODUODENOSCOPY N/A 12/17/2013   Dr. RGala Romney gastric erythema, erosion, mild chronic inflammation on path    ESOPHAGOGASTRODUODENOSCOPY (EGD) WITH PROPOFOL N/A 06/16/2021   Procedure: ESOPHAGOGASTRODUODENOSCOPY (EGD) WITH PROPOFOL;  Surgeon: RDaneil Dolin MD;  [facility];  Service: Endoscopy;  Laterality: N/A;   EXCISION OF BREAST BIOPSY Right 10/12/2020   Procedure: EXCISION OF RIGHT BREAST BIOPSY;  Surgeon: JAviva Signs MD;  Location: [facility];  Service: General;  Laterality: Right;   LAPAROSCOPIC APPENDECTOMY  09/29/2011   Procedure: APPENDECTOMY LAPAROSCOPIC;  Surgeon: MJamesetta So MD;  Location: [facility];  Service: General;;  incidental appendectomy   LEFT HEART CATH AND CORONARY ANGIOGRAPHY N/A 02/28/2019   Procedure: LEFT HEART  CATH AND CORONARY ANGIOGRAPHY;  Surgeon: Jettie Booze, MD;  Location: [place] [facility];  Service: Cardiovascular;  Laterality: N/A;   MASTECTOMY MODIFIED RADICAL Right 02/18/2020   Procedure: MASTECTOMY MODIFIED RADICAL;  Surgeon: Aviva Signs, MD;  Location: [facility];   Service: General;  Laterality: Right;   MASTECTOMY, PARTIAL Right 07/13/2020   Procedure: RIGHT PARTIAL MASTECTOMY;  Surgeon: Aviva Signs, MD;  Location: [facility];  Service: General;  Laterality: Right;   PARTIAL MASTECTOMY WITH NEEDLE LOCALIZATION AND AXILLARY SENTINEL LYMPH NODE BX Right 09/18/2018   Procedure: RIGHT PARTIAL MASTECTOMY AFTER NEEDLE LOCALIZATION, SENTINEL LYMPH NODE BIOPSY RIGHT AXILLA;  Surgeon: Aviva Signs, MD;  Location: [facility];  Service: General;  Laterality: Right;   POLYPECTOMY  06/16/2021   Procedure: POLYPECTOMY;  Surgeon: Daneil Dolin, MD;  [facility];  Service: Endoscopy;;   SIMPLE MASTECTOMY WITH AXILLARY SENTINEL NODE BIOPSY Left 06/15/2020   Procedure: LEFT SIMPLE MASTECTOMY;  Surgeon: Aviva Signs, MD;  Location: [facility];  Service: General;  Laterality: Left;   Current Outpatient Medications on File Prior to Visit  Medication Sig Dispense Refill   amLODipine (NORVASC) 10 MG tablet TAKE 1 TABLET BY MOUTH AT BEDTIME 90 tablet 0   anastrozole (ARIMIDEX) 1 MG tablet TAKE 1 TABLET(1 MG) BY MOUTH DAILY 30 tablet 4   aspirin EC 81 MG tablet Take 81 mg by mouth daily.     B Complex-C-Folic Acid (DIALYVITE 425) 0.8 MG TABS Take 1 tablet by mouth daily.     cinacalcet (SENSIPAR) 30 MG tablet Take 2 tablets (60 mg total) by mouth daily. (Patient taking differently: Take 30 mg by mouth daily.) 90 tablet 0   Darbepoetin Alfa (ARANESP, ALBUMIN FREE, IJ) Take once a week with dialysis unsure of dose     DULoxetine (CYMBALTA) 30 MG capsule TAKE 1 CAPSULE(30 MG) BY MOUTH DAILY (Patient taking differently: Take 30 mg by mouth daily.) 90 capsule 1   gabapentin (NEURONTIN) 300 MG capsule TAKE 1 CAPSULE(300 MG) BY MOUTH AT BEDTIME (Patient taking differently: Take 300 mg by mouth at bedtime.) 90 capsule 1   HYDROcodone-acetaminophen (NORCO/VICODIN) 5-325 MG tablet Take 1 tablet by mouth every 6 (six) hours as needed. 20 tablet 0   isosorbide mononitrate (IMDUR) 30 MG  24 hr tablet Take 0.5 tablets (15 mg total) by mouth daily. 15 tablet 6   lanthanum (FOSRENOL) 1000 MG chewable tablet Chew 2,000-3,000 mg by mouth See admin instructions. Take 3 tablets (3000 mg) by mouth with meals and take 2 tablets (2000 mg) with snacks     lidocaine-prilocaine (EMLA) cream Apply 1 application  topically every Monday, Wednesday, and Friday with hemodialysis.     meclizine (ANTIVERT) 25 MG tablet Take 1 tablet (25 mg total) by mouth 3 (three) times daily as needed for dizziness. 30 tablet 0   nebivolol (BYSTOLIC) 10 MG tablet Take 1 tablet (10 mg total) by mouth daily. (Patient taking differently: Take 10 mg by mouth in the morning and at bedtime.) 90 tablet 3   pantoprazole (PROTONIX) 40 MG tablet TAKE 1 TABLET(40 MG) BY MOUTH DAILY (Patient taking differently: Take 40 mg by mouth daily.) 30 tablet 0   VITAMIN D PO Take by mouth.     No current facility-administered medications on file prior to visit.   No Known Allergies Social History   Socioeconomic History   Marital status: Married    Spouse name: Not on file   Number of children: Not on file   Years  of education: Not on file   Highest education level: Not on file  Occupational History   Occupation: Merchandiser, retail: [employer]  Tobacco Use   Smoking status: Never   Smokeless tobacco: Never  Vaping Use   Vaping Use: Never used  Substance and Sexual Activity   Alcohol use: No   Drug use: No   Sexual activity: Yes    Birth control/protection: Surgical  Other Topics Concern   Not on file  Social History Narrative   Works at [employer] in [place].    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   Also has to get items from high shelves-causes achy pain in shoulder area      Married.   Children are grown, out of house.   Social Determinants of Health   Financial Resource Strain: Low Risk  (09/09/2021)   Overall Financial Resource Strain (CARDIA)    Difficulty of Paying Living  Expenses: Not hard at all  Food Insecurity: No Food Insecurity (09/09/2021)   Hunger Vital Sign    Worried About Running Out of Food in the Last Year: Never true    Ran Out of Food in the Last Year: Never true  Transportation Needs: No Transportation Needs (09/09/2021)   PRAPARE - Hydrologist (Medical): No    Lack of Transportation (Non-Medical): No  Physical Activity: Inactive (09/09/2021)   Exercise Vital Sign    Days of Exercise per Week: 0 days    Minutes of Exercise per Session: 0 min  Stress: No Stress Concern Present (09/09/2021)   [place]    Feeling of Stress : Not at all  Social Connections: [employer] (09/09/2021)   Social Connection and Isolation Panel [NHANES]    Frequency of Communication with Friends and Family: More than three times a week    Frequency of Social Gatherings with Friends and Family: More than three times a week    Attends Religious Services: More than 4 times per year    Active Member of [employer] or Organizations: Yes    Attends Archivist Meetings: More than 4 times per year    Marital Status: Married  Human resources officer Violence: Not At Risk (09/09/2021)   Humiliation, Afraid, Rape, and Kick questionnaire    Fear of Current or Ex-Partner: No    Emotionally Abused: No    Physically Abused: No    Sexually Abused: No      Review of Systems  Respiratory:  Positive for cough.   All other systems reviewed and are negative.      Objective:   Physical Exam Vitals reviewed.  Constitutional:      General: She is not in acute distress.    Appearance: Normal appearance. She is normal weight.  Cardiovascular:     Rate and Rhythm: Normal rate and regular rhythm.     Pulses: Normal pulses.     Heart sounds: Murmur (thrill from AV fistual heard as murmur) heard.  Pulmonary:     Effort: Pulmonary effort is normal. No respiratory distress.      Breath sounds: Normal breath sounds. No stridor. No wheezing, rhonchi or rales.  Chest:     Chest wall: No tenderness.  Musculoskeletal:     Right lower leg: No edema.     Left lower leg: No edema.  Neurological:     Mental Status: She is  alert.           Assessment & Plan:  Pure hypercholesterolemia - Plan: LDL Cholesterol, Direct  Atypical chest pain  Dizziness Patient has a chronic longstanding history of atypical chest pain.  It initially responded to Imdur 15 mg a day so I will increase the dose to 30 mg a day and see if she does better at this dose.  I also recommended discontinuation of meclizine and replacing with scopolamine 1 patch every 72 hours for vertigo.  Discontinue the patch once the vertigo has resolved.  Check a lipid panel.  Goal LDL cholesterol is less than 70.  Patient is not fasting so I will check a direct LDL

## 2022-03-03 DIAGNOSIS — D631 Anemia in chronic kidney disease: Secondary | ICD-10-CM | POA: Diagnosis not present

## 2022-03-03 DIAGNOSIS — N2581 Secondary hyperparathyroidism of renal origin: Secondary | ICD-10-CM | POA: Diagnosis not present

## 2022-03-03 DIAGNOSIS — Z992 Dependence on renal dialysis: Secondary | ICD-10-CM | POA: Diagnosis not present

## 2022-03-03 DIAGNOSIS — E1129 Type 2 diabetes mellitus with other diabetic kidney complication: Secondary | ICD-10-CM | POA: Diagnosis not present

## 2022-03-03 DIAGNOSIS — N186 End stage renal disease: Secondary | ICD-10-CM | POA: Diagnosis not present

## 2022-03-03 DIAGNOSIS — D509 Iron deficiency anemia, unspecified: Secondary | ICD-10-CM | POA: Diagnosis not present

## 2022-03-03 LAB — LDL CHOLESTEROL, DIRECT: Direct LDL: 105 mg/dL — ABNORMAL HIGH (ref <100)

## 2022-03-06 ENCOUNTER — Encounter: Payer: Self-pay | Admitting: Family Medicine

## 2022-03-06 ENCOUNTER — Ambulatory Visit (INDEPENDENT_AMBULATORY_CARE_PROVIDER_SITE_OTHER): Payer: Medicare Other | Admitting: Family Medicine

## 2022-03-06 VITALS — HR 84 | Temp 98.2°F | Ht 62.0 in | Wt 178.0 lb

## 2022-03-06 DIAGNOSIS — D631 Anemia in chronic kidney disease: Secondary | ICD-10-CM | POA: Diagnosis not present

## 2022-03-06 DIAGNOSIS — M792 Neuralgia and neuritis, unspecified: Secondary | ICD-10-CM | POA: Diagnosis not present

## 2022-03-06 DIAGNOSIS — D509 Iron deficiency anemia, unspecified: Secondary | ICD-10-CM | POA: Diagnosis not present

## 2022-03-06 DIAGNOSIS — N2581 Secondary hyperparathyroidism of renal origin: Secondary | ICD-10-CM | POA: Diagnosis not present

## 2022-03-06 DIAGNOSIS — N186 End stage renal disease: Secondary | ICD-10-CM | POA: Diagnosis not present

## 2022-03-06 DIAGNOSIS — Z992 Dependence on renal dialysis: Secondary | ICD-10-CM | POA: Diagnosis not present

## 2022-03-06 DIAGNOSIS — E1129 Type 2 diabetes mellitus with other diabetic kidney complication: Secondary | ICD-10-CM | POA: Diagnosis not present

## 2022-03-06 NOTE — Progress Notes (Signed)
Subjective:    Patient ID: Megan [name], female    DOB: October 18, 1960, 61 y.o.   MRN: [id number] Patient states that she developed sharp burning pain in her right first MTP joint this morning at 9 AM prompting her visit.  She states that it hurts like pins-and-needles.  She is taking gabapentin every night.  She states that once a month this happens.  The joint is not erythematous.  There is no swelling.  It is not warm to the touch.  She has full and painless range of motion in the first MTP joint.  I am able to manipulate the joint without eliciting any pain.  I am able to touch the skin without eliciting any pain.  Therefore I do not feel that this is a gout exacerbation.  Patient states that in the past she used Emla cream on the joint and it felt better Past Medical History:  Diagnosis Date   (HFpEF) heart failure with preserved ejection fraction ([place])    a. 01/2019 Echo: EF 55-60%, mild conc LVH. DD.  Torn MV chordae.   Anemia    Atypical chest pain    a. 08/2018 MV: EF 59%, no ischemia; b. 02/2019 Cath: nonobs dzs.   Blood transfusion without reported diagnosis    Breast cancer ([place]) 10/12/2020   Cataract    ESRD (end stage renal disease) on dialysis [facility])    a. HD T, T, S   Essential hypertension, benign    GERD (gastroesophageal reflux disease)    Headache    Hemorrhoids    Mixed hyperlipidemia    Morbid obesity (HCC)    Non-obstructive CAD (coronary artery disease)    a. 02/2019 CathL LM nl, LAD 32m LCX nl, RCA 25p, EF 55-65%.   PONV (postoperative nausea and vomiting)    S/P colonoscopy Jan 2011   Dr. 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 ([place]    Type 2 diabetes mellitus ([place]    Wears glasses    Past Surgical History:  Procedure Laterality Date   ABDOMINAL HYSTERECTOMY     APPENDECTOMY     ARTERY BIOPSY N/A 05/09/2018   Procedure: RIGHT TEMPORAL ARTERY BIOPSY;  Surgeon: WJudeth Horn MD;  [place]   Service: General;  Laterality: N/A;   BREAST BIOPSY Right 06/15/2020   Procedure: RIGHT BREAST BIOPSY;  Surgeon: JAviva Signs MD;  [facility];  Service: General;  Laterality: Right;   CATARACT EXTRACTION W/PHACO Left 02/09/2017   Procedure: CATARACT EXTRACTION PHACO AND INTRAOCULAR LENS PLACEMENT LEFT EYE;  Surgeon: HTonny Branch MD;  [facility]: [facility];  Service: Ophthalmology;  Laterality: Left;  CDE: 4.89   CATARACT EXTRACTION W/PHACO Right 06/04/2017   Procedure: CATARACT EXTRACTION PHACO AND INTRAOCULAR LENS PLACEMENT (IOC);  Surgeon: HTonny Branch MD;  [facility]: [facility];  Service: Ophthalmology;  Laterality: Right;  CDE: 4.12   CHOLECYSTECTOMY  09/29/2011   Procedure: LAPAROSCOPIC CHOLECYSTECTOMY;  Surgeon: MJamesetta So MD;  Location: [facility];  Service: General;  Laterality: N/A;   COLONOSCOPY  08/2009   Dr. HBenson [place] sessile polyp (benign lymphoid), large hemorrhoids, repeat 5-10 years   COLONOSCOPY N/A 06/12/2016   prominent hemorrhoids   COLONOSCOPY WITH PROPOFOL N/A 06/16/2021   Procedure: COLONOSCOPY WITH PROPOFOL;  Surgeon: RDaneil Dolin MD;  [facility];  Service: Endoscopy;  Laterality: N/A;  9:30am (dialysis pt)   ESOPHAGOGASTRODUODENOSCOPY  09/05/2011   RDEY:CXKGYhiatal hernia; remainder of exam normal. No explanation  for patient's abdominal pain with today's examination   ESOPHAGOGASTRODUODENOSCOPY N/A 12/17/2013   Dr. [name]: gastric erythema, erosion, mild chronic inflammation on path    ESOPHAGOGASTRODUODENOSCOPY (EGD) WITH PROPOFOL N/A 06/16/2021   Procedure: ESOPHAGOGASTRODUODENOSCOPY (EGD) WITH PROPOFOL;  Surgeon: Daneil Dolin, MD;  [facility];  Service: Endoscopy;  Laterality: N/A;   EXCISION OF BREAST BIOPSY Right 10/12/2020   Procedure: EXCISION OF RIGHT BREAST BIOPSY;  Surgeon: Aviva Signs, MD;  Location: [facility];  Service: General;  Laterality: Right;   LAPAROSCOPIC APPENDECTOMY  09/29/2011   Procedure: APPENDECTOMY LAPAROSCOPIC;   Surgeon: Jamesetta So, MD;  Location: [facility];  Service: General;;  incidental appendectomy   LEFT HEART CATH AND CORONARY ANGIOGRAPHY N/A 02/28/2019   Procedure: LEFT HEART CATH AND CORONARY ANGIOGRAPHY;  Surgeon: Jettie Booze, MD;  Location: [place] [facility];  Service: Cardiovascular;  Laterality: N/A;   MASTECTOMY MODIFIED RADICAL Right 02/18/2020   Procedure: MASTECTOMY MODIFIED RADICAL;  Surgeon: Aviva Signs, MD;  Location: [facility];  Service: General;  Laterality: Right;   MASTECTOMY, PARTIAL Right 07/13/2020   Procedure: RIGHT PARTIAL MASTECTOMY;  Surgeon: Aviva Signs, MD;  Location: [facility];  Service: General;  Laterality: Right;   PARTIAL MASTECTOMY WITH NEEDLE LOCALIZATION AND AXILLARY SENTINEL LYMPH NODE BX Right 09/18/2018   Procedure: RIGHT PARTIAL MASTECTOMY AFTER NEEDLE LOCALIZATION, SENTINEL LYMPH NODE BIOPSY RIGHT AXILLA;  Surgeon: Aviva Signs, MD;  [facility]: [facility];  Service: General;  Laterality: Right;   POLYPECTOMY  06/16/2021   Procedure: POLYPECTOMY;  Surgeon: Daneil Dolin, MD;  [facility];  Service: Endoscopy;;   SIMPLE MASTECTOMY WITH AXILLARY SENTINEL NODE BIOPSY Left 06/15/2020   Procedure: LEFT SIMPLE MASTECTOMY;  Surgeon: Aviva Signs, MD;  Location: [facility];  Service: General;  Laterality: Left;   Current Outpatient Medications on File Prior to Visit  Medication Sig Dispense Refill   amLODipine (NORVASC) 10 MG tablet TAKE 1 TABLET BY MOUTH AT BEDTIME 90 tablet 0   anastrozole (ARIMIDEX) 1 MG tablet TAKE 1 TABLET(1 MG) BY MOUTH DAILY 30 tablet 4   aspirin EC 81 MG tablet Take 81 mg by mouth daily.     atorvastatin (LIPITOR) 20 MG tablet Take 1 tablet (20 mg total) by mouth daily. TAKE 1 TABLET DAILY 90 tablet 3   B Complex-C-Folic Acid (DIALYVITE 557) 0.8 MG TABS Take 1 tablet by mouth daily.     cinacalcet (SENSIPAR) 30 MG tablet Take 2 tablets (60 mg total) by mouth daily. (Patient taking differently: Take 30 mg by mouth daily.) 90  tablet 0   Darbepoetin Alfa (ARANESP, ALBUMIN FREE, IJ) Take once a week with dialysis unsure of dose     DULoxetine (CYMBALTA) 30 MG capsule TAKE 1 CAPSULE(30 MG) BY MOUTH DAILY (Patient taking differently: Take 30 mg by mouth daily.) 90 capsule 1   gabapentin (NEURONTIN) 300 MG capsule TAKE 1 CAPSULE(300 MG) BY MOUTH AT BEDTIME (Patient taking differently: Take 300 mg by mouth at bedtime.) 90 capsule 1   HYDROcodone-acetaminophen (NORCO/VICODIN) 5-325 MG tablet Take 1 tablet by mouth every 6 (six) hours as needed. 20 tablet 0   isosorbide mononitrate (IMDUR) 30 MG 24 hr tablet Take 0.5 tablets (15 mg total) by mouth daily. 15 tablet 6   lanthanum (FOSRENOL) 1000 MG chewable tablet Chew 2,000-3,000 mg by mouth See admin instructions. Take 3 tablets (3000 mg) by mouth with meals and take 2 tablets (2000 mg) with snacks     lidocaine-prilocaine (EMLA) cream Apply 1  application  topically every Monday, Wednesday, and Friday with hemodialysis.     meclizine (ANTIVERT) 25 MG tablet Take 1 tablet (25 mg total) by mouth 3 (three) times daily as needed for dizziness. 30 tablet 0   nebivolol (BYSTOLIC) 10 MG tablet Take 1 tablet (10 mg total) by mouth daily. (Patient taking differently: Take 10 mg by mouth in the morning and at bedtime.) 90 tablet 3   pantoprazole (PROTONIX) 40 MG tablet TAKE 1 TABLET(40 MG) BY MOUTH DAILY (Patient taking differently: Take 40 mg by mouth daily.) 30 tablet 0   scopolamine (TRANSDERM-SCOP) 1 MG/3DAYS Place 1 patch (1.5 mg total) onto the skin every 3 (three) days. Stop meclizine 10 patch 12   VITAMIN D PO Take by mouth.     No current facility-administered medications on file prior to visit.   No Known Allergies Social History   Socioeconomic History   Marital status: Married    Spouse name: Not on file   Number of children: Not on file   Years of education: Not on file   Highest education level: Not on file  Occupational History   Occupation: Merchandiser, retail:  [id number]  Tobacco Use   Smoking status: Never   Smokeless tobacco: Never  Vaping Use   Vaping Use: Never used  Substance and Sexual Activity   Alcohol use: No   Drug use: No   Sexual activity: Yes    Birth control/protection: Surgical  Other Topics Concern   Not on file  Social History Narrative   Works at [employer] in [place].    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   Also has to get items from high shelves-causes achy pain in shoulder area      Married.   Children are grown, out of house.   Social Determinants of Health   Financial Resource Strain: Low Risk  (09/09/2021)   Overall Financial Resource Strain (CARDIA)    Difficulty of Paying Living Expenses: Not hard at all  Food Insecurity: No Food Insecurity (09/09/2021)   Hunger Vital Sign    Worried About Running Out of Food in the Last Year: Never true    Ran Out of Food in the Last Year: Never true  Transportation Needs: No Transportation Needs (09/09/2021)   PRAPARE - Hydrologist (Medical): No    Lack of Transportation (Non-Medical): No  Physical Activity: Inactive (09/09/2021)   Exercise Vital Sign    Days of Exercise per Week: 0 days    Minutes of Exercise per Session: 0 min  Stress: No Stress Concern Present (09/09/2021)   [place]    Feeling of Stress : Not at all  Social Connections: [place] (09/09/2021)   Social Connection and Isolation Panel [NHANES]    Frequency of Communication with Friends and Family: More than three times a week    Frequency of Social Gatherings with Friends and Family: More than three times a week    Attends Religious Services: More than 4 times per year    Active Member of [employer] or Organizations: Yes    Attends Archivist Meetings: More than 4 times per year    Marital Status: Married  Human resources officer Violence: Not At Risk (09/09/2021)    Humiliation, Afraid, Rape, and Kick questionnaire    Fear of Current or Ex-Partner: No    Emotionally Abused: No  Physically Abused: No    Sexually Abused: No    Past Medical History:  Diagnosis Date   (HFpEF) heart failure with preserved ejection fraction ([place])    a. 01/2019 Echo: EF 55-60%, mild conc LVH. DD.  Torn MV chordae.   Anemia    Atypical chest pain    a. 08/2018 MV: EF 59%, no ischemia; b. 02/2019 Cath: nonobs dzs.   Blood transfusion without reported diagnosis    Breast cancer ([place]) 10/12/2020   Cataract    ESRD (end stage renal disease) on dialysis [facility])    a. HD T, T, S   Essential hypertension, benign    GERD (gastroesophageal reflux disease)    Headache    Hemorrhoids    Mixed hyperlipidemia    Morbid obesity (HCC)    Non-obstructive CAD (coronary artery disease)    a. 02/2019 CathL LM nl, LAD 12m LCX nl, RCA 25p, EF 55-65%.   PONV (postoperative nausea and vomiting)    S/P colonoscopy Jan 2011   Dr. 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 ([place]    Type 2 diabetes mellitus ([place]    Wears glasses    Past Surgical History:  Procedure Laterality Date   ABDOMINAL HYSTERECTOMY     APPENDECTOMY     ARTERY BIOPSY N/A 05/09/2018   Procedure: RIGHT TEMPORAL ARTERY BIOPSY;  Surgeon: WJudeth Horn MD;  [place]  Service: General;  Laterality: N/A;   BREAST BIOPSY Right 06/15/2020   Procedure: RIGHT BREAST BIOPSY;  Surgeon: JAviva Signs MD;  [facility];  Service: General;  Laterality: Right;   CATARACT EXTRACTION W/PHACO Left 02/09/2017   Procedure: CATARACT EXTRACTION PHACO AND INTRAOCULAR LENS PLACEMENT LEFT EYE;  Surgeon: HTonny Branch MD;  [facility];  Service: Ophthalmology;  Laterality: Left;  CDE: 4.89   CATARACT EXTRACTION W/PHACO Right 06/04/2017   Procedure: CATARACT EXTRACTION PHACO AND INTRAOCULAR LENS PLACEMENT (IOC);  Surgeon: HTonny Branch MD;  [facility];  Service:  Ophthalmology;  Laterality: Right;  CDE: 4.12   CHOLECYSTECTOMY  09/29/2011   Procedure: LAPAROSCOPIC CHOLECYSTECTOMY;  Surgeon: MJamesetta So MD;  [facility]: [facility];  Service: General;  Laterality: N/A;   COLONOSCOPY  08/2009   Dr. HBenson [place] sessile polyp (benign lymphoid), large hemorrhoids, repeat 5-10 years   COLONOSCOPY N/A 06/12/2016   prominent hemorrhoids   COLONOSCOPY WITH PROPOFOL N/A 06/16/2021   Procedure: COLONOSCOPY WITH PROPOFOL;  Surgeon: RDaneil Dolin MD;  [facility];  Service: Endoscopy;  Laterality: N/A;  9:30am (dialysis pt)   ESOPHAGOGASTRODUODENOSCOPY  09/05/2011   RHBZ:JIRCVhiatal hernia; remainder of exam normal. No explanation for patient's abdominal pain with today's examination   ESOPHAGOGASTRODUODENOSCOPY N/A 12/17/2013   Dr. RGala Romney gastric erythema, erosion, mild chronic inflammation on path    ESOPHAGOGASTRODUODENOSCOPY (EGD) WITH PROPOFOL N/A 06/16/2021   Procedure: ESOPHAGOGASTRODUODENOSCOPY (EGD) WITH PROPOFOL;  Surgeon: RDaneil Dolin MD;  [facility];  Service: Endoscopy;  Laterality: N/A;   EXCISION OF BREAST BIOPSY Right 10/12/2020   Procedure: EXCISION OF RIGHT BREAST BIOPSY;  Surgeon: JAviva Signs MD;  [facility];  Service: General;  Laterality: Right;   LAPAROSCOPIC APPENDECTOMY  09/29/2011   Procedure: APPENDECTOMY LAPAROSCOPIC;  Surgeon: MJamesetta So MD;  [facility];  Service: General;;  incidental appendectomy   LEFT HEART CATH AND CORONARY ANGIOGRAPHY N/A 02/28/2019   Procedure: LEFT HEART CATH AND CORONARY ANGIOGRAPHY;  Surgeon: VJettie Booze MD;  [facility]  Service: Cardiovascular;  Laterality: N/A;   MASTECTOMY MODIFIED RADICAL Right 02/18/2020   Procedure: MASTECTOMY MODIFIED RADICAL;  Surgeon: Aviva Signs, MD;  [facility];  Service: General;  Laterality: Right;   MASTECTOMY, PARTIAL Right 07/13/2020   Procedure: RIGHT PARTIAL MASTECTOMY;  Surgeon: Aviva Signs, MD;   [facility];  Service: General;  Laterality: Right;   PARTIAL MASTECTOMY WITH NEEDLE LOCALIZATION AND AXILLARY SENTINEL LYMPH NODE BX Right 09/18/2018   Procedure: RIGHT PARTIAL MASTECTOMY AFTER NEEDLE LOCALIZATION, SENTINEL LYMPH NODE BIOPSY RIGHT AXILLA;  Surgeon: Aviva Signs, MD;  [facility];  Service: General;  Laterality: Right;   POLYPECTOMY  06/16/2021   Procedure: POLYPECTOMY;  Surgeon: Daneil Dolin, MD;  [facility];  Service: Endoscopy;;   SIMPLE MASTECTOMY WITH AXILLARY SENTINEL NODE BIOPSY Left 06/15/2020   Procedure: LEFT SIMPLE MASTECTOMY;  Surgeon: Aviva Signs, MD;  [facility];  Service: General;  Laterality: Left;   Current Outpatient Medications on File Prior to Visit  Medication Sig Dispense Refill   amLODipine (NORVASC) 10 MG tablet TAKE 1 TABLET BY MOUTH AT BEDTIME 90 tablet 0   anastrozole (ARIMIDEX) 1 MG tablet TAKE 1 TABLET(1 MG) BY MOUTH DAILY 30 tablet 4   aspirin EC 81 MG tablet Take 81 mg by mouth daily.     atorvastatin (LIPITOR) 20 MG tablet Take 1 tablet (20 mg total) by mouth daily. TAKE 1 TABLET DAILY 90 tablet 3   B Complex-C-Folic Acid (DIALYVITE 409) 0.8 MG TABS Take 1 tablet by mouth daily.     cinacalcet (SENSIPAR) 30 MG tablet Take 2 tablets (60 mg total) by mouth daily. (Patient taking differently: Take 30 mg by mouth daily.) 90 tablet 0   Darbepoetin Alfa (ARANESP, ALBUMIN FREE, IJ) Take once a week with dialysis unsure of dose     DULoxetine (CYMBALTA) 30 MG capsule TAKE 1 CAPSULE(30 MG) BY MOUTH DAILY (Patient taking differently: Take 30 mg by mouth daily.) 90 capsule 1   gabapentin (NEURONTIN) 300 MG capsule TAKE 1 CAPSULE(300 MG) BY MOUTH AT BEDTIME (Patient taking differently: Take 300 mg by mouth at bedtime.) 90 capsule 1   HYDROcodone-acetaminophen (NORCO/VICODIN) 5-325 MG tablet Take 1 tablet by mouth every 6 (six) hours as needed. 20 tablet 0   isosorbide mononitrate (IMDUR) 30 MG 24 hr tablet Take 0.5 tablets (15  mg total) by mouth daily. 15 tablet 6   lanthanum (FOSRENOL) 1000 MG chewable tablet Chew 2,000-3,000 mg by mouth See admin instructions. Take 3 tablets (3000 mg) by mouth with meals and take 2 tablets (2000 mg) with snacks     lidocaine-prilocaine (EMLA) cream Apply 1 application  topically every Monday, Wednesday, and Friday with hemodialysis.     meclizine (ANTIVERT) 25 MG tablet Take 1 tablet (25 mg total) by mouth 3 (three) times daily as needed for dizziness. 30 tablet 0   nebivolol (BYSTOLIC) 10 MG tablet Take 1 tablet (10 mg total) by mouth daily. (Patient taking differently: Take 10 mg by mouth in the morning and at bedtime.) 90 tablet 3   pantoprazole (PROTONIX) 40 MG tablet TAKE 1 TABLET(40 MG) BY MOUTH DAILY (Patient taking differently: Take 40 mg by mouth daily.) 30 tablet 0   scopolamine (TRANSDERM-SCOP) 1 MG/3DAYS Place 1 patch (1.5 mg total) onto the skin every 3 (three) days. Stop meclizine 10 patch 12   VITAMIN D PO Take by mouth.     No current facility-administered medications on file prior to visit.  No Known Allergies Social History   Socioeconomic History   Marital status: Married    Spouse name: Not on file   Number of children: Not on file   Years of education: Not on file   Highest education level: Not on file  Occupational History   Occupation: Merchandiser, retail: [id number]  Tobacco Use   Smoking status: Never   Smokeless tobacco: Never  Vaping Use   Vaping Use: Never used  Substance and Sexual Activity   Alcohol use: No   Drug use: No   Sexual activity: Yes    Birth control/protection: Surgical  Other Topics Concern   Not on file  Social History Narrative   Works at [employer] in [place].    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   Also has to get items from high shelves-causes achy pain in shoulder area      Married.   Children are grown, out of house.   Social Determinants of Health   Financial Resource Strain: Low  Risk  (09/09/2021)   Overall Financial Resource Strain (CARDIA)    Difficulty of Paying Living Expenses: Not hard at all  Food Insecurity: No Food Insecurity (09/09/2021)   Hunger Vital Sign    Worried About Running Out of Food in the Last Year: Never true    Ran Out of Food in the Last Year: Never true  Transportation Needs: No Transportation Needs (09/09/2021)   PRAPARE - Hydrologist (Medical): No    Lack of Transportation (Non-Medical): No  Physical Activity: Inactive (09/09/2021)   Exercise Vital Sign    Days of Exercise per Week: 0 days    Minutes of Exercise per Session: 0 min  Stress: No Stress Concern Present (09/09/2021)   [place]    Feeling of Stress : Not at all  Social Connections: [place] (09/09/2021)   Social Connection and Isolation Panel [NHANES]    Frequency of Communication with Friends and Family: More than three times a week    Frequency of Social Gatherings with Friends and Family: More than three times a week    Attends Religious Services: More than 4 times per year    Active Member of [employer] or Organizations: Yes    Attends Archivist Meetings: More than 4 times per year    Marital Status: Married  Human resources officer Violence: Not At Risk (09/09/2021)   Humiliation, Afraid, Rape, and Kick questionnaire    Fear of Current or Ex-Partner: No    Emotionally Abused: No    Physically Abused: No    Sexually Abused: No      Review of Systems  Respiratory:  Positive for cough.   All other systems reviewed and are negative.      Objective:   Physical Exam Vitals reviewed.  Constitutional:      General: She is not in acute distress.    Appearance: Normal appearance. She is normal weight.  Cardiovascular:     Rate and Rhythm: Normal rate and regular rhythm.     Pulses: Normal pulses.     Heart sounds: Murmur (thrill from AV fistual heard as  murmur) heard.  Pulmonary:     Effort: Pulmonary effort is normal. No respiratory distress.     Breath sounds: Normal breath sounds. No stridor. No wheezing, rhonchi or rales.  Chest:     Chest  wall: No tenderness.  Musculoskeletal:     Right lower leg: No edema.     Left lower leg: No edema.     Right foot: Normal range of motion. No deformity.  Feet:     Right foot:     Skin integrity: No ulcer, blister, skin breakdown, erythema, warmth or callus.     Left foot:     Skin integrity: No ulcer, blister, skin breakdown, erythema, warmth or callus.  Neurological:     Mental Status: She is alert.           Assessment & Plan:  Neuropathic pain Given her history, she would certainly be at risk for gout however her clinical presentation does not support the diagnosis of podagra.  I believe this is neuropathic pain.  However I think that taking gabapentin every night might be contributing to her dizziness.  Therefore I will ask her to stop gabapentin and instead we will treat the pain in her first MTP joint with Emla cream.  She has used this previously and it is worked well for her.  If the cream does not alleviate the pain, she could resume gabapentin and increase the dose to 300 mg twice a day but I cautioned the patient that higher doses can make dizziness even worse

## 2022-03-08 DIAGNOSIS — E1129 Type 2 diabetes mellitus with other diabetic kidney complication: Secondary | ICD-10-CM | POA: Diagnosis not present

## 2022-03-08 DIAGNOSIS — N186 End stage renal disease: Secondary | ICD-10-CM | POA: Diagnosis not present

## 2022-03-08 DIAGNOSIS — N2581 Secondary hyperparathyroidism of renal origin: Secondary | ICD-10-CM | POA: Diagnosis not present

## 2022-03-08 DIAGNOSIS — D509 Iron deficiency anemia, unspecified: Secondary | ICD-10-CM | POA: Diagnosis not present

## 2022-03-08 DIAGNOSIS — Z992 Dependence on renal dialysis: Secondary | ICD-10-CM | POA: Diagnosis not present

## 2022-03-08 DIAGNOSIS — D631 Anemia in chronic kidney disease: Secondary | ICD-10-CM | POA: Diagnosis not present

## 2022-03-09 ENCOUNTER — Ambulatory Visit (INDEPENDENT_AMBULATORY_CARE_PROVIDER_SITE_OTHER): Payer: Medicare Other | Admitting: Cardiology

## 2022-03-09 ENCOUNTER — Encounter: Payer: Self-pay | Admitting: Cardiology

## 2022-03-09 ENCOUNTER — Ambulatory Visit: Payer: Medicare Other | Admitting: Cardiology

## 2022-03-09 ENCOUNTER — Other Ambulatory Visit: Payer: Self-pay | Admitting: *Deleted

## 2022-03-09 VITALS — BP 136/74 | HR 84 | Ht 62.0 in | Wt 178.6 lb

## 2022-03-09 DIAGNOSIS — I10 Essential (primary) hypertension: Secondary | ICD-10-CM | POA: Diagnosis not present

## 2022-03-09 DIAGNOSIS — E782 Mixed hyperlipidemia: Secondary | ICD-10-CM

## 2022-03-09 DIAGNOSIS — R0789 Other chest pain: Secondary | ICD-10-CM | POA: Diagnosis not present

## 2022-03-09 MED ORDER — ISOSORBIDE MONONITRATE ER 30 MG PO TB24: 30.0000 mg | ORAL_TABLET | Freq: Every day | ORAL | Status: DC

## 2022-03-09 NOTE — Progress Notes (Signed)
Clinical Summary Ms. Kilgore is a 61 y.o.female seen today for follow up of the following medical problems.      1.Chest pain - long history of chest pain 2018 nuclear stress: normal perfusion Jan 2020 nuclear stress no ischemia - 02/2019 cath: prox RCA 25%, mid LAD 10%, otherwise normal vessels   ER visit 11/17/21 with chest pain - trop neg x2, EKG SR without ischemic changes - pressure midchest. 6/10 in severity. Can occur at rest or with activity. No other associated symptoms. Lasts few seconds. Not positional. Not related to eating. - similar to prior chest pains.    ER visit 02/2022 with headache, chest pain -trops neg x 2.  - initially chest pains had improved on imudr, but recurrence - pcp increased imdur to '30mg'$  daily.      2.. HTN - compliant with meds    3.Hyperipidemia - LDL 105   4. ESRD Past Medical History:  Diagnosis Date   (HFpEF) heart failure with preserved ejection fraction ([place])    a. 01/2019 Echo: EF 55-60%, mild conc LVH. DD.  Torn MV chordae.   Anemia    Atypical chest pain    a. 08/2018 MV: EF 59%, no ischemia; b. 02/2019 Cath: nonobs dzs.   Blood transfusion without reported diagnosis    Breast cancer ([place]) 10/12/2020   Cataract    ESRD (end stage renal disease) on dialysis [facility])    a. HD T, T, S   Essential hypertension, benign    GERD (gastroesophageal reflux disease)    Headache    Hemorrhoids    Mixed hyperlipidemia    Morbid obesity (HCC)    Non-obstructive CAD (coronary artery disease)    a. 02/2019 CathL LM nl, LAD 25m LCX nl, RCA 25p, EF 55-65%.   PONV (postoperative nausea and vomiting)    S/P colonoscopy Jan 2011   Dr. 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 ([place]    Type 2 diabetes mellitus (HCC)    Wears glasses      No Known Allergies   Current Outpatient Medications  Medication Sig Dispense Refill   amLODipine (NORVASC) 10 MG tablet TAKE 1 TABLET BY  MOUTH AT BEDTIME 90 tablet 0   anastrozole (ARIMIDEX) 1 MG tablet TAKE 1 TABLET(1 MG) BY MOUTH DAILY 30 tablet 4   aspirin EC 81 MG tablet Take 81 mg by mouth daily.     atorvastatin (LIPITOR) 20 MG tablet Take 1 tablet (20 mg total) by mouth daily. TAKE 1 TABLET DAILY 90 tablet 3   B Complex-C-Folic Acid (DIALYVITE 8938 0.8 MG TABS Take 1 tablet by mouth daily.     cinacalcet (SENSIPAR) 30 MG tablet Take 2 tablets (60 mg total) by mouth daily. (Patient taking differently: Take 30 mg by mouth daily.) 90 tablet 0   Darbepoetin Alfa (ARANESP, ALBUMIN FREE, IJ) Take once a week with dialysis unsure of dose     DULoxetine (CYMBALTA) 30 MG capsule TAKE 1 CAPSULE(30 MG) BY MOUTH DAILY (Patient taking differently: Take 30 mg by mouth daily.) 90 capsule 1   gabapentin (NEURONTIN) 300 MG capsule TAKE 1 CAPSULE(300 MG) BY MOUTH AT BEDTIME (Patient taking differently: Take 300 mg by mouth at bedtime.) 90 capsule 1   HYDROcodone-acetaminophen (NORCO/VICODIN) 5-325 MG tablet Take 1 tablet by mouth every 6 (six) hours as needed. 20 tablet 0   isosorbide mononitrate (IMDUR) 30 MG 24 hr tablet Take 0.5 tablets (15 mg total) by  mouth daily. 15 tablet 6   lanthanum (FOSRENOL) 1000 MG chewable tablet Chew 2,000-3,000 mg by mouth See admin instructions. Take 3 tablets (3000 mg) by mouth with meals and take 2 tablets (2000 mg) with snacks     lidocaine-prilocaine (EMLA) cream Apply 1 application  topically every Monday, Wednesday, and Friday with hemodialysis.     meclizine (ANTIVERT) 25 MG tablet Take 1 tablet (25 mg total) by mouth 3 (three) times daily as needed for dizziness. 30 tablet 0   nebivolol (BYSTOLIC) 10 MG tablet Take 1 tablet (10 mg total) by mouth daily. (Patient taking differently: Take 10 mg by mouth in the morning and at bedtime.) 90 tablet 3   pantoprazole (PROTONIX) 40 MG tablet TAKE 1 TABLET(40 MG) BY MOUTH DAILY (Patient taking differently: Take 40 mg by mouth daily.) 30 tablet 0   scopolamine  (TRANSDERM-SCOP) 1 MG/3DAYS Place 1 patch (1.5 mg total) onto the skin every 3 (three) days. Stop meclizine 10 patch 12   VITAMIN D PO Take by mouth.     No current facility-administered medications for this visit.     Past Surgical History:  Procedure Laterality Date   ABDOMINAL HYSTERECTOMY     APPENDECTOMY     ARTERY BIOPSY N/A 05/09/2018   Procedure: RIGHT TEMPORAL ARTERY BIOPSY;  Surgeon: Judeth Horn, MD;  Location: [place];  Service: General;  Laterality: N/A;   BREAST BIOPSY Right 06/15/2020   Procedure: RIGHT BREAST BIOPSY;  Surgeon: Aviva Signs, MD;  [facility];  Service: General;  Laterality: Right;   CATARACT EXTRACTION W/PHACO Left 02/09/2017   Procedure: CATARACT EXTRACTION PHACO AND INTRAOCULAR LENS PLACEMENT LEFT EYE;  Surgeon: Tonny , MD;  Location: [facility];  Service: Ophthalmology;  Laterality: Left;  CDE: 4.89   CATARACT EXTRACTION W/PHACO Right 06/04/2017   Procedure: CATARACT EXTRACTION PHACO AND INTRAOCULAR LENS PLACEMENT (IOC);  Surgeon: Tonny , MD;  Location: [facility];  Service: Ophthalmology;  Laterality: Right;  CDE: 4.12   CHOLECYSTECTOMY  09/29/2011   Procedure: LAPAROSCOPIC CHOLECYSTECTOMY;  Surgeon: Jamesetta So, MD;  Location[facility];  Service: General;  Laterality: N/A;   COLONOSCOPY  08/2009   Dr. Benson Norway: sessile polyp (benign lymphoid), large hemorrhoids, repeat 5-10 years   COLONOSCOPY N/A 06/12/2016   prominent hemorrhoids   COLONOSCOPY WITH PROPOFOL N/A 06/16/2021   Procedure: COLONOSCOPY WITH PROPOFOL;  Surgeon: Daneil Dolin, MD;  [facility];  Service: Endoscopy;  Laterality: N/A;  9:30am (dialysis pt)   ESOPHAGOGASTRODUODENOSCOPY  09/05/2011   EUM:PNTIR hiatal hernia; remainder of exam normal. No explanation for patient's abdominal pain with today's examination   ESOPHAGOGASTRODUODENOSCOPY N/A 12/17/2013   Dr. [name]: gastric erythema, erosion, mild chronic inflammation on path    ESOPHAGOGASTRODUODENOSCOPY (EGD) WITH  PROPOFOL N/A 06/16/2021   Procedure: ESOPHAGOGASTRODUODENOSCOPY (EGD) WITH PROPOFOL;  Surgeon: Daneil Dolin, MD;  [facility];  Service: Endoscopy;  Laterality: N/A;   EXCISION OF BREAST BIOPSY Right 10/12/2020   Procedure: EXCISION OF RIGHT BREAST BIOPSY;  Surgeon: Aviva Signs, MD;  [facility];  Service: General;  Laterality: Right;   LAPAROSCOPIC APPENDECTOMY  09/29/2011   Procedure: APPENDECTOMY LAPAROSCOPIC;  Surgeon: Jamesetta So, MD;  Location: [facility];  Service: General;;  incidental appendectomy   LEFT HEART CATH AND CORONARY ANGIOGRAPHY N/A 02/28/2019   Procedure: LEFT HEART CATH AND CORONARY ANGIOGRAPHY;  Surgeon: Jettie Booze, MD;  Location: [place] [facility];  Service: Cardiovascular;  Laterality: N/A;   MASTECTOMY MODIFIED RADICAL Right  02/18/2020   Procedure: MASTECTOMY MODIFIED RADICAL;  Surgeon: Aviva Signs, MD;  [facility];  Service: General;  Laterality: Right;   MASTECTOMY, PARTIAL Right 07/13/2020   Procedure: RIGHT PARTIAL MASTECTOMY;  Surgeon: Aviva Signs, MD;  [facility];  Service: General;  Laterality: Right;   PARTIAL MASTECTOMY WITH NEEDLE LOCALIZATION AND AXILLARY SENTINEL LYMPH NODE BX Right 09/18/2018   Procedure: RIGHT PARTIAL MASTECTOMY AFTER NEEDLE LOCALIZATION, SENTINEL LYMPH NODE BIOPSY RIGHT AXILLA;  Surgeon: Aviva Signs, MD;  [facility];  Service: General;  Laterality: Right;   POLYPECTOMY  06/16/2021   Procedure: POLYPECTOMY;  Surgeon: Daneil Dolin, MD;  [facility];  Service: Endoscopy;;   SIMPLE MASTECTOMY WITH AXILLARY SENTINEL NODE BIOPSY Left 06/15/2020   Procedure: LEFT SIMPLE MASTECTOMY;  Surgeon: Aviva Signs, MD;  [facility];  Service: General;  Laterality: Left;     No Known Allergies    Family History  Problem Relation Age of Onset   Hypertension Mother    Coronary artery disease Mother    Diabetes Mother    Hypertension Sister    Coronary artery disease Sister     Hypertension Brother    Heart attack Father    Hypertension Son    Heart attack Maternal Aunt    Hypertension Maternal Aunt    Diabetes Maternal Aunt    Heart attack Maternal Uncle    Hypertension Maternal Uncle    Diabetes Maternal Uncle    Heart attack Paternal Aunt    Hypertension Paternal Aunt    Diabetes Paternal Aunt    Heart attack Paternal Uncle    Hypertension Paternal Uncle    Diabetes Paternal Uncle    Heart attack Maternal Grandmother    Heart attack Maternal Grandfather    Heart attack Paternal Grandmother    Heart attack Paternal Grandfather    Colon cancer Neg Hx      Social History Ms. Tonnesen reports that she has never smoked. She has never used smokeless tobacco. Ms. Draeger reports no history of alcohol use.   Review of Systems CONSTITUTIONAL: No weight loss, fever, chills, weakness or fatigue.  HEENT: Eyes: No visual loss, blurred vision, double vision or yellow sclerae.No hearing loss, sneezing, congestion, runny nose or sore throat.  SKIN: No rash or itching.  CARDIOVASCULAR: per hpi RESPIRATORY: No shortness of breath, cough or sputum.  GASTROINTESTINAL: No anorexia, nausea, vomiting or diarrhea. No abdominal pain or blood.  GENITOURINARY: No burning on urination, no polyuria NEUROLOGICAL: No headache, dizziness, syncope, paralysis, ataxia, numbness or tingling in the extremities. No change in bowel or bladder control.  MUSCULOSKELETAL: No muscle, back pain, joint pain or stiffness.  LYMPHATICS: No enlarged nodes. No history of splenectomy.  PSYCHIATRIC: No history of depression or anxiety.  ENDOCRINOLOGIC: No reports of sweating, cold or heat intolerance. No polyuria or polydipsia.  Marland Kitchen   Physical Examination Today's Vitals   03/09/22 0820  BP: 136/74  Pulse: 84  SpO2: 97%  Weight: 178 lb 9.6 oz (81 kg)  Height: '5\' 2"'$  (1.575 m)   Body mass index is 32.67 kg/m.  Gen: resting comfortably, no acute distress HEENT: no scleral  icterus, pupils equal round and reactive, no palptable cervical adenopathy,  CV: RRR, no m/r/g, no jvd Resp: Clear to auscultation bilaterally GI: abdomen is soft, non-tender, non-distended, normal bowel sounds, no hepatosplenomegaly MSK: extremities are warm, no edema.  Skin: warm, no rash Neuro:  no focal deficits Psych: appropriate affect   Diagnostic Studies NST: 08/2018  Nuclear stress EF: 59%. There was no ST segment deviation noted during stress. This is a low risk study. The left ventricular ejection fraction is normal (55-65%).    Assessment and Plan  1.Chest pain - several year history of chest pain - ischemic evaluations have been negative including stress testing in 2018 and 2020 and cath in 2020 - symptoms improved with imdur, though an isolated recurrence recently. Imdur has already been increased to '30mg'$  daily, monitor symptoms at this time.    2. HTN - she is overall at goal, continue current meds  3. Hyperlipidemia - reasonable control, continue current meds  F/u 6 months      Arnoldo Lenis, M.D

## 2022-03-09 NOTE — Patient Instructions (Signed)
Medication Instructions:  Continue all current medications.   Labwork: none  Testing/Procedures: none  Follow-Up: 6 months   Any Other Special Instructions Will Be Listed Below (If Applicable).   If you need a refill on your cardiac medications before your next appointment, please call your pharmacy.  

## 2022-03-10 DIAGNOSIS — Z992 Dependence on renal dialysis: Secondary | ICD-10-CM | POA: Diagnosis not present

## 2022-03-10 DIAGNOSIS — D631 Anemia in chronic kidney disease: Secondary | ICD-10-CM | POA: Diagnosis not present

## 2022-03-10 DIAGNOSIS — D509 Iron deficiency anemia, unspecified: Secondary | ICD-10-CM | POA: Diagnosis not present

## 2022-03-10 DIAGNOSIS — N2581 Secondary hyperparathyroidism of renal origin: Secondary | ICD-10-CM | POA: Diagnosis not present

## 2022-03-10 DIAGNOSIS — E1129 Type 2 diabetes mellitus with other diabetic kidney complication: Secondary | ICD-10-CM | POA: Diagnosis not present

## 2022-03-10 DIAGNOSIS — N186 End stage renal disease: Secondary | ICD-10-CM | POA: Diagnosis not present

## 2022-03-13 ENCOUNTER — Other Ambulatory Visit: Payer: Self-pay | Admitting: Family Medicine

## 2022-03-13 DIAGNOSIS — G44229 Chronic tension-type headache, not intractable: Secondary | ICD-10-CM | POA: Diagnosis not present

## 2022-03-13 DIAGNOSIS — G4486 Cervicogenic headache: Secondary | ICD-10-CM | POA: Diagnosis not present

## 2022-03-13 DIAGNOSIS — R42 Dizziness and giddiness: Secondary | ICD-10-CM | POA: Diagnosis not present

## 2022-03-13 DIAGNOSIS — Z79899 Other long term (current) drug therapy: Secondary | ICD-10-CM | POA: Diagnosis not present

## 2022-03-13 DIAGNOSIS — R2689 Other abnormalities of gait and mobility: Secondary | ICD-10-CM | POA: Diagnosis not present

## 2022-03-13 DIAGNOSIS — Z992 Dependence on renal dialysis: Secondary | ICD-10-CM | POA: Diagnosis not present

## 2022-03-13 DIAGNOSIS — N2581 Secondary hyperparathyroidism of renal origin: Secondary | ICD-10-CM | POA: Diagnosis not present

## 2022-03-13 DIAGNOSIS — E1129 Type 2 diabetes mellitus with other diabetic kidney complication: Secondary | ICD-10-CM | POA: Diagnosis not present

## 2022-03-13 DIAGNOSIS — N186 End stage renal disease: Secondary | ICD-10-CM | POA: Diagnosis not present

## 2022-03-13 DIAGNOSIS — D631 Anemia in chronic kidney disease: Secondary | ICD-10-CM | POA: Diagnosis not present

## 2022-03-13 DIAGNOSIS — D509 Iron deficiency anemia, unspecified: Secondary | ICD-10-CM | POA: Diagnosis not present

## 2022-03-13 NOTE — Telephone Encounter (Signed)
Patient was seen last week and she forgot to ask for diflucan to be called in to [facility] [address] In [place]. She states the medication she got last week has given her a yeast infection.  CB# [phone]

## 2022-03-14 ENCOUNTER — Ambulatory Visit: Payer: Self-pay | Admitting: *Deleted

## 2022-03-14 DIAGNOSIS — N186 End stage renal disease: Secondary | ICD-10-CM | POA: Diagnosis not present

## 2022-03-14 DIAGNOSIS — N040 Nephrotic syndrome with minor glomerular abnormality: Secondary | ICD-10-CM | POA: Diagnosis not present

## 2022-03-14 DIAGNOSIS — Z992 Dependence on renal dialysis: Secondary | ICD-10-CM | POA: Diagnosis not present

## 2022-03-14 NOTE — Telephone Encounter (Signed)
See triage note.

## 2022-03-14 NOTE — Telephone Encounter (Signed)
  Chief Complaint: vaginal itching Symptoms: itching, no discharge Frequency: constant Pertinent Negatives: Patient denies discharge or pain or UTI symptoms Disposition: '[]'$ ED /'[]'$ Urgent Care (no appt availability in office) / '[]'$ Appointment(In office/virtual)/ '[]'$  [facility]/ '[]'$ Home Care/ '[]'$ Refused Recommended Disposition /'[]'$ [facility]/ '[x]'$  Follow-up with PCP Additional Notes: Pt was seen in office last week. She started having vaginal yeast infection symptoms Saturday. Pt is requesting a rx be called to [facility]   Reason for Disposition  [1] Symptoms of a yeast infection (i.e., itchy, white discharge, not bad smelling) AND [2] feels like prior vaginal yeast infections  Answer Assessment - Initial Assessment Questions 1. SYMPTOM: "What's the main symptom you're concerned about?" (e.g., pain, itching, dryness)     Itching, and burning with urination 2. LOCATION: "Where is the  itching located?" (e.g., inside/outside, left/right)     vagina 3. ONSET: "When did the  itchy  start?"     Saturday 4. PAIN: "Is there any pain?" If Yes, ask: "How bad is it?" (Scale: 1-10; mild, moderate, severe)   -  MILD (1-3): Doesn't interfere with normal activities.    -  MODERATE (4-7): Interferes with normal activities (e.g., work or school) or awakens from sleep.     -  SEVERE (8-10): Excruciating pain, unable to do any normal activities.     no 5. ITCHING: "Is there any itching?" If Yes, ask: "How bad is it?" (Scale: 1-10; mild, moderate, severe)     Very itchy 6. CAUSE: "What do you think is causing the discharge?" "Have you had the same problem before? What happened then?"     Changed medications, 7. OTHER SYMPTOMS: "Do you have any other symptoms?" (e.g., fever, itching, vaginal bleeding, pain with urination, injury to genital area, vaginal foreign body)     no 8. PREGNANCY: "Is there any chance you are pregnant?" "When was your last menstrual period?"     Na  Protocols used:  Vaginal Symptoms-A-AH

## 2022-03-14 NOTE — Telephone Encounter (Signed)
  Notes to clinic:  I spoke with pt on a triage call. Generally having vaginal yeast infection symptoms. Note was sent to request new rx instead of requesting refill on this old rx by a different provider.      Requested Prescriptions  Pending Prescriptions Disp Refills   fluconazole (DIFLUCAN) 150 MG tablet      Sig: Take 1 tablet (150 mg total) by mouth daily.     Off-Protocol Failed - 03/13/2022  2:13 PM      Failed - Medication not assigned to a protocol, review manually.      Passed - Valid encounter within last 12 months    Recent Outpatient Visits           4 months ago Medication care plan discussed with patient   [place] [name], MD   6 months ago Dyspnea, unspecified type   [place] Dennard Schaumann Cammie Mcgee, MD   1 year ago Clostridium difficile colitis   [place] [name], MD   1 year ago Clostridium difficile colitis   [place] [name], MD   1 year ago Clostridium difficile colitis   [place] Pickard, Cammie Mcgee, MD       Future Appointments             In 4 months Derek Jack, MD [place] at [facility]   In 6 months [place], [name], MD [place], [place]

## 2022-03-14 NOTE — Telephone Encounter (Signed)
Voicemail left to discuss yeast infection symptoms. Will try again later today, pt asked to return call.

## 2022-03-15 DIAGNOSIS — I159 Secondary hypertension, unspecified: Secondary | ICD-10-CM | POA: Diagnosis not present

## 2022-03-15 DIAGNOSIS — R300 Dysuria: Secondary | ICD-10-CM | POA: Diagnosis not present

## 2022-03-15 DIAGNOSIS — N2581 Secondary hyperparathyroidism of renal origin: Secondary | ICD-10-CM | POA: Diagnosis not present

## 2022-03-15 DIAGNOSIS — D631 Anemia in chronic kidney disease: Secondary | ICD-10-CM | POA: Diagnosis not present

## 2022-03-15 DIAGNOSIS — E119 Type 2 diabetes mellitus without complications: Secondary | ICD-10-CM | POA: Diagnosis not present

## 2022-03-15 DIAGNOSIS — E1129 Type 2 diabetes mellitus with other diabetic kidney complication: Secondary | ICD-10-CM | POA: Diagnosis not present

## 2022-03-15 DIAGNOSIS — N186 End stage renal disease: Secondary | ICD-10-CM | POA: Diagnosis not present

## 2022-03-15 DIAGNOSIS — Z992 Dependence on renal dialysis: Secondary | ICD-10-CM | POA: Diagnosis not present

## 2022-03-15 DIAGNOSIS — D509 Iron deficiency anemia, unspecified: Secondary | ICD-10-CM | POA: Diagnosis not present

## 2022-03-16 ENCOUNTER — Other Ambulatory Visit: Payer: Self-pay | Admitting: Family Medicine

## 2022-03-16 MED ORDER — FLUCONAZOLE 150 MG PO TABS: 150.0000 mg | ORAL_TABLET | Freq: Once | ORAL | 0 refills | Status: AC

## 2022-03-17 DIAGNOSIS — Z992 Dependence on renal dialysis: Secondary | ICD-10-CM | POA: Diagnosis not present

## 2022-03-17 DIAGNOSIS — D631 Anemia in chronic kidney disease: Secondary | ICD-10-CM | POA: Diagnosis not present

## 2022-03-17 DIAGNOSIS — I159 Secondary hypertension, unspecified: Secondary | ICD-10-CM | POA: Diagnosis not present

## 2022-03-17 DIAGNOSIS — N2581 Secondary hyperparathyroidism of renal origin: Secondary | ICD-10-CM | POA: Diagnosis not present

## 2022-03-17 DIAGNOSIS — D509 Iron deficiency anemia, unspecified: Secondary | ICD-10-CM | POA: Diagnosis not present

## 2022-03-17 DIAGNOSIS — N186 End stage renal disease: Secondary | ICD-10-CM | POA: Diagnosis not present

## 2022-03-20 DIAGNOSIS — D631 Anemia in chronic kidney disease: Secondary | ICD-10-CM | POA: Diagnosis not present

## 2022-03-20 DIAGNOSIS — N186 End stage renal disease: Secondary | ICD-10-CM | POA: Diagnosis not present

## 2022-03-20 DIAGNOSIS — I159 Secondary hypertension, unspecified: Secondary | ICD-10-CM | POA: Diagnosis not present

## 2022-03-20 DIAGNOSIS — Z992 Dependence on renal dialysis: Secondary | ICD-10-CM | POA: Diagnosis not present

## 2022-03-20 DIAGNOSIS — D509 Iron deficiency anemia, unspecified: Secondary | ICD-10-CM | POA: Diagnosis not present

## 2022-03-20 DIAGNOSIS — N2581 Secondary hyperparathyroidism of renal origin: Secondary | ICD-10-CM | POA: Diagnosis not present

## 2022-03-21 ENCOUNTER — Other Ambulatory Visit: Payer: Self-pay | Admitting: Family Medicine

## 2022-03-21 NOTE — Telephone Encounter (Signed)
Requested medication (s) are due for refill today -provider review   Requested medication (s) are on the active medication list -yes  Future visit scheduled -no  Last refill: 11/08/21 #30  Notes to clinic: non delegated Rx  Requested Prescriptions  Pending Prescriptions Disp Refills   meclizine (ANTIVERT) 25 MG tablet [Pharmacy Med Name: MECLIZINE '25MG'$  RX TABLETS] 30 tablet 0    Sig: TAKE 1 TABLET(25 MG) BY MOUTH THREE TIMES DAILY AS NEEDED FOR DIZZINESS     Not Delegated - Gastroenterology: Antiemetics Failed - 03/21/2022  8:52 AM      Failed - This refill cannot be delegated      Passed - Valid encounter within last 6 months    Recent Outpatient Visits           4 months ago Medication care plan discussed with patient   [place] [name], MD   6 months ago Dyspnea, unspecified type   [place] Dennard Schaumann, Cammie Mcgee, MD   1 year ago Clostridium difficile colitis   [place] Dennard Schaumann, Cammie Mcgee, MD   1 year ago Clostridium difficile colitis   [place] Dennard Schaumann, Cammie Mcgee, MD   1 year ago Clostridium difficile colitis   [place] Pickard, Cammie Mcgee, MD       Future Appointments             In 3 months Derek Jack, MD [place] at [facility]   In 5 months Branch, [name], MD [place], [place]               Requested Prescriptions  Pending Prescriptions Disp Refills   meclizine (ANTIVERT) 25 MG tablet [Pharmacy Med Name: MECLIZINE '25MG'$  RX TABLETS] 30 tablet 0    Sig: TAKE 1 TABLET(25 MG) BY MOUTH THREE TIMES DAILY AS NEEDED FOR DIZZINESS     Not Delegated - Gastroenterology: Antiemetics Failed - 03/21/2022  8:52 AM      Failed - This refill cannot be delegated      Passed - Valid encounter within last 6 months    Recent Outpatient Visits           4 months ago Medication care plan discussed with patient    [place] [name], MD   6 months ago Dyspnea, unspecified type   [place] [name], MD   1 year ago Clostridium difficile colitis   [place] [name], MD   1 year ago Clostridium difficile colitis   [place] [name], MD   1 year ago Clostridium difficile colitis   [place], Cammie Mcgee, MD       Future Appointments             In 3 months Derek Jack, MD [place] at [facility]   In 5 months Branch, [name], MD [place], [place]

## 2022-03-22 DIAGNOSIS — D509 Iron deficiency anemia, unspecified: Secondary | ICD-10-CM | POA: Diagnosis not present

## 2022-03-22 DIAGNOSIS — I159 Secondary hypertension, unspecified: Secondary | ICD-10-CM | POA: Diagnosis not present

## 2022-03-22 DIAGNOSIS — N2581 Secondary hyperparathyroidism of renal origin: Secondary | ICD-10-CM | POA: Diagnosis not present

## 2022-03-22 DIAGNOSIS — Z992 Dependence on renal dialysis: Secondary | ICD-10-CM | POA: Diagnosis not present

## 2022-03-22 DIAGNOSIS — N186 End stage renal disease: Secondary | ICD-10-CM | POA: Diagnosis not present

## 2022-03-22 DIAGNOSIS — D631 Anemia in chronic kidney disease: Secondary | ICD-10-CM | POA: Diagnosis not present

## 2022-03-24 DIAGNOSIS — D631 Anemia in chronic kidney disease: Secondary | ICD-10-CM | POA: Diagnosis not present

## 2022-03-24 DIAGNOSIS — Z992 Dependence on renal dialysis: Secondary | ICD-10-CM | POA: Diagnosis not present

## 2022-03-24 DIAGNOSIS — D509 Iron deficiency anemia, unspecified: Secondary | ICD-10-CM | POA: Diagnosis not present

## 2022-03-24 DIAGNOSIS — N186 End stage renal disease: Secondary | ICD-10-CM | POA: Diagnosis not present

## 2022-03-24 DIAGNOSIS — N2581 Secondary hyperparathyroidism of renal origin: Secondary | ICD-10-CM | POA: Diagnosis not present

## 2022-03-24 DIAGNOSIS — I159 Secondary hypertension, unspecified: Secondary | ICD-10-CM | POA: Diagnosis not present

## 2022-03-27 DIAGNOSIS — I159 Secondary hypertension, unspecified: Secondary | ICD-10-CM | POA: Diagnosis not present

## 2022-03-27 DIAGNOSIS — N186 End stage renal disease: Secondary | ICD-10-CM | POA: Diagnosis not present

## 2022-03-27 DIAGNOSIS — N2581 Secondary hyperparathyroidism of renal origin: Secondary | ICD-10-CM | POA: Diagnosis not present

## 2022-03-27 DIAGNOSIS — D631 Anemia in chronic kidney disease: Secondary | ICD-10-CM | POA: Diagnosis not present

## 2022-03-27 DIAGNOSIS — Z992 Dependence on renal dialysis: Secondary | ICD-10-CM | POA: Diagnosis not present

## 2022-03-27 DIAGNOSIS — D509 Iron deficiency anemia, unspecified: Secondary | ICD-10-CM | POA: Diagnosis not present

## 2022-03-29 DIAGNOSIS — D509 Iron deficiency anemia, unspecified: Secondary | ICD-10-CM | POA: Diagnosis not present

## 2022-03-29 DIAGNOSIS — I159 Secondary hypertension, unspecified: Secondary | ICD-10-CM | POA: Diagnosis not present

## 2022-03-29 DIAGNOSIS — Z992 Dependence on renal dialysis: Secondary | ICD-10-CM | POA: Diagnosis not present

## 2022-03-29 DIAGNOSIS — N2581 Secondary hyperparathyroidism of renal origin: Secondary | ICD-10-CM | POA: Diagnosis not present

## 2022-03-29 DIAGNOSIS — N186 End stage renal disease: Secondary | ICD-10-CM | POA: Diagnosis not present

## 2022-03-29 DIAGNOSIS — D631 Anemia in chronic kidney disease: Secondary | ICD-10-CM | POA: Diagnosis not present

## 2022-03-31 ENCOUNTER — Other Ambulatory Visit: Payer: Self-pay

## 2022-03-31 DIAGNOSIS — Z992 Dependence on renal dialysis: Secondary | ICD-10-CM | POA: Diagnosis not present

## 2022-03-31 DIAGNOSIS — D509 Iron deficiency anemia, unspecified: Secondary | ICD-10-CM | POA: Diagnosis not present

## 2022-03-31 DIAGNOSIS — N2581 Secondary hyperparathyroidism of renal origin: Secondary | ICD-10-CM | POA: Diagnosis not present

## 2022-03-31 DIAGNOSIS — I159 Secondary hypertension, unspecified: Secondary | ICD-10-CM | POA: Diagnosis not present

## 2022-03-31 DIAGNOSIS — N186 End stage renal disease: Secondary | ICD-10-CM | POA: Diagnosis not present

## 2022-03-31 DIAGNOSIS — D631 Anemia in chronic kidney disease: Secondary | ICD-10-CM | POA: Diagnosis not present

## 2022-03-31 MED ORDER — HYDROCODONE-ACETAMINOPHEN 5-325 MG PO TABS: 1.0000 | ORAL_TABLET | Freq: Four times a day (QID) | ORAL | 0 refills | Status: DC | PRN

## 2022-03-31 NOTE — Telephone Encounter (Signed)
LOV 03/06/22 Last refill 01/13/22, #20, 0 refills  Please review, thanks!

## 2022-04-03 ENCOUNTER — Other Ambulatory Visit: Payer: Self-pay | Admitting: Family Medicine

## 2022-04-03 DIAGNOSIS — N2581 Secondary hyperparathyroidism of renal origin: Secondary | ICD-10-CM | POA: Diagnosis not present

## 2022-04-03 DIAGNOSIS — N186 End stage renal disease: Secondary | ICD-10-CM | POA: Diagnosis not present

## 2022-04-03 DIAGNOSIS — D631 Anemia in chronic kidney disease: Secondary | ICD-10-CM | POA: Diagnosis not present

## 2022-04-03 DIAGNOSIS — Z992 Dependence on renal dialysis: Secondary | ICD-10-CM | POA: Diagnosis not present

## 2022-04-03 DIAGNOSIS — I159 Secondary hypertension, unspecified: Secondary | ICD-10-CM | POA: Diagnosis not present

## 2022-04-03 DIAGNOSIS — D509 Iron deficiency anemia, unspecified: Secondary | ICD-10-CM | POA: Diagnosis not present

## 2022-04-05 DIAGNOSIS — Z992 Dependence on renal dialysis: Secondary | ICD-10-CM | POA: Diagnosis not present

## 2022-04-05 DIAGNOSIS — I159 Secondary hypertension, unspecified: Secondary | ICD-10-CM | POA: Diagnosis not present

## 2022-04-05 DIAGNOSIS — D631 Anemia in chronic kidney disease: Secondary | ICD-10-CM | POA: Diagnosis not present

## 2022-04-05 DIAGNOSIS — N2581 Secondary hyperparathyroidism of renal origin: Secondary | ICD-10-CM | POA: Diagnosis not present

## 2022-04-05 DIAGNOSIS — D509 Iron deficiency anemia, unspecified: Secondary | ICD-10-CM | POA: Diagnosis not present

## 2022-04-05 DIAGNOSIS — N186 End stage renal disease: Secondary | ICD-10-CM | POA: Diagnosis not present

## 2022-04-05 DIAGNOSIS — R300 Dysuria: Secondary | ICD-10-CM | POA: Insufficient documentation

## 2022-04-07 DIAGNOSIS — Z992 Dependence on renal dialysis: Secondary | ICD-10-CM | POA: Diagnosis not present

## 2022-04-07 DIAGNOSIS — D509 Iron deficiency anemia, unspecified: Secondary | ICD-10-CM | POA: Diagnosis not present

## 2022-04-07 DIAGNOSIS — N186 End stage renal disease: Secondary | ICD-10-CM | POA: Diagnosis not present

## 2022-04-07 DIAGNOSIS — I159 Secondary hypertension, unspecified: Secondary | ICD-10-CM | POA: Diagnosis not present

## 2022-04-07 DIAGNOSIS — D631 Anemia in chronic kidney disease: Secondary | ICD-10-CM | POA: Diagnosis not present

## 2022-04-07 DIAGNOSIS — R300 Dysuria: Secondary | ICD-10-CM | POA: Diagnosis not present

## 2022-04-07 DIAGNOSIS — N2581 Secondary hyperparathyroidism of renal origin: Secondary | ICD-10-CM | POA: Diagnosis not present

## 2022-04-10 ENCOUNTER — Other Ambulatory Visit: Payer: Self-pay

## 2022-04-10 NOTE — Telephone Encounter (Signed)
Pharmacy faxed a refill request for nebivolol (BYSTOLIC) 10 MG tablet [[id number]]    Order Details Dose: 10 mg Route: Oral Frequency: Daily  Dispense Quantity: 90 tablet Refills: 3        Sig: Take 1 tablet (10 mg total) by mouth daily.  Patient taking differently: Take 10 mg by mouth in the morning and at bedtime.       Start Date: 11/08/21 End Date: --  Written Date: 11/08/21 Expiration Date: 11/08/22  Original Order:  nebivolol (BYSTOLIC) 10 MG tablet [[id number]]

## 2022-04-11 DIAGNOSIS — N2581 Secondary hyperparathyroidism of renal origin: Secondary | ICD-10-CM | POA: Diagnosis not present

## 2022-04-11 DIAGNOSIS — D509 Iron deficiency anemia, unspecified: Secondary | ICD-10-CM | POA: Diagnosis not present

## 2022-04-11 DIAGNOSIS — Z992 Dependence on renal dialysis: Secondary | ICD-10-CM | POA: Diagnosis not present

## 2022-04-11 DIAGNOSIS — D631 Anemia in chronic kidney disease: Secondary | ICD-10-CM | POA: Diagnosis not present

## 2022-04-11 DIAGNOSIS — N186 End stage renal disease: Secondary | ICD-10-CM | POA: Diagnosis not present

## 2022-04-11 DIAGNOSIS — I159 Secondary hypertension, unspecified: Secondary | ICD-10-CM | POA: Diagnosis not present

## 2022-04-11 MED ORDER — NEBIVOLOL HCL 10 MG PO TABS: 10.0000 mg | ORAL_TABLET | Freq: Every day | ORAL | 3 refills | Status: DC

## 2022-04-11 NOTE — Telephone Encounter (Signed)
Per office visit notes dated 11/08/21 pt med changed to daily. Noted on a med reconciliation that pt is taking med twice a day. Last RF #90 3 RF. Please verify and call pt- requesting another refill.    Requested Prescriptions  Pending Prescriptions Disp Refills   nebivolol (BYSTOLIC) 10 MG tablet 90 tablet 3    Sig: Take 1 tablet (10 mg total) by mouth daily.     Cardiovascular: Beta Blockers 3 Failed - 04/10/2022  3:05 PM      Failed - Cr in normal range and within 360 days    Creat  Date Value Ref Range Status  08/26/2021 6.69 (H) 0.50 - 1.03 mg/dL Final   Creatinine, Ser  Date Value Ref Range Status  02/26/2022 11.17 (H) 0.44 - 1.00 mg/dL Final         Passed - AST in normal range and within 360 days    AST  Date Value Ref Range Status  02/26/2022 19 15 - 41 U/L Final         Passed - ALT in normal range and within 360 days    ALT  Date Value Ref Range Status  02/26/2022 22 0 - 44 U/L Final         Passed - Last BP in normal range    BP Readings from Last 1 Encounters:  03/09/22 136/74         Passed - Last Heart Rate in normal range    Pulse Readings from Last 1 Encounters:  03/09/22 84         Passed - Valid encounter within last 6 months    Recent Outpatient Visits           5 months ago Medication care plan discussed with patient   [place] [name], MD   7 months ago Dyspnea, unspecified type   [place] [name], MD   1 year ago Clostridium difficile colitis   [facility] [name], MD   1 year ago Clostridium difficile colitis   [place] [name], MD   1 year ago Clostridium difficile colitis   [place], Cammie Mcgee, MD       Future Appointments             In 3 months Derek Jack, MD [place] at [facility]   In 5 months [place], [name], MD [place].  [facility], [place]

## 2022-04-12 ENCOUNTER — Other Ambulatory Visit: Payer: Self-pay | Admitting: *Deleted

## 2022-04-12 ENCOUNTER — Telehealth: Payer: Self-pay

## 2022-04-12 DIAGNOSIS — Z992 Dependence on renal dialysis: Secondary | ICD-10-CM

## 2022-04-12 DIAGNOSIS — D631 Anemia in chronic kidney disease: Secondary | ICD-10-CM | POA: Diagnosis not present

## 2022-04-12 DIAGNOSIS — I159 Secondary hypertension, unspecified: Secondary | ICD-10-CM | POA: Diagnosis not present

## 2022-04-12 DIAGNOSIS — N2581 Secondary hyperparathyroidism of renal origin: Secondary | ICD-10-CM | POA: Diagnosis not present

## 2022-04-12 DIAGNOSIS — D509 Iron deficiency anemia, unspecified: Secondary | ICD-10-CM | POA: Diagnosis not present

## 2022-04-12 DIAGNOSIS — N186 End stage renal disease: Secondary | ICD-10-CM | POA: Diagnosis not present

## 2022-04-12 NOTE — Telephone Encounter (Signed)
Pt called in stating that this med nebivolol (BYSTOLIC) 10 MG tablet [[id number]] still has not been refilled. Pt also states that she has been taking 2 tablets a day instead of the one as this refill is prescribed. Pt would like for clinical staff to please look into why this med has not been refilled. Pt states that she is not completely out of this med.  Cb#: [phone]

## 2022-04-13 ENCOUNTER — Other Ambulatory Visit: Payer: Self-pay | Admitting: Family Medicine

## 2022-04-13 MED ORDER — ISOSORBIDE MONONITRATE ER 30 MG PO TB24: 30.0000 mg | ORAL_TABLET | Freq: Every day | ORAL | 3 refills | Status: DC

## 2022-04-13 MED ORDER — NEBIVOLOL HCL 10 MG PO TABS: 10.0000 mg | ORAL_TABLET | Freq: Two times a day (BID) | ORAL | 3 refills | Status: DC

## 2022-04-13 NOTE — Telephone Encounter (Signed)
Patient called to follow up on refill request for  nebivolol (BYSTOLIC) 10 MG tablet   Stated she is completely out and took her last dose on Tuesday, 04/11/22.  Pharmacy confirmed as  [id number] - [address], [address] [place], [place] [address]  Phone:  [phone]  Fax:  [phone]  DEA #:  [id number]  Patient states pharmacy won't give her any additional medication without a new script since it's too soon to refill the one on file.  Please advise at [phone]

## 2022-04-14 DIAGNOSIS — Z992 Dependence on renal dialysis: Secondary | ICD-10-CM | POA: Diagnosis not present

## 2022-04-14 DIAGNOSIS — N2581 Secondary hyperparathyroidism of renal origin: Secondary | ICD-10-CM | POA: Diagnosis not present

## 2022-04-14 DIAGNOSIS — E1129 Type 2 diabetes mellitus with other diabetic kidney complication: Secondary | ICD-10-CM | POA: Diagnosis not present

## 2022-04-14 DIAGNOSIS — I159 Secondary hypertension, unspecified: Secondary | ICD-10-CM | POA: Diagnosis not present

## 2022-04-14 DIAGNOSIS — N040 Nephrotic syndrome with minor glomerular abnormality: Secondary | ICD-10-CM | POA: Diagnosis not present

## 2022-04-14 DIAGNOSIS — N186 End stage renal disease: Secondary | ICD-10-CM | POA: Diagnosis not present

## 2022-04-14 DIAGNOSIS — D631 Anemia in chronic kidney disease: Secondary | ICD-10-CM | POA: Diagnosis not present

## 2022-04-14 DIAGNOSIS — E119 Type 2 diabetes mellitus without complications: Secondary | ICD-10-CM | POA: Diagnosis not present

## 2022-04-16 NOTE — Progress Notes (Unsigned)
Established Dialysis Access   History of Present Illness   Megan [name] is a 61 y.o. (06-Oct-1960) female who presents for re-evaluation of left forearm swelling. She has had this issue in the past starting last year with recent recurrence. She most recently was seen in March by Dr. Donnetta Hutching for same concerns. She had placement of a left radiocephalic fistula proximally 5 years ago in [place].  She has had good use of this with no bleeding of issues with her dialysis treatments. 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 Prior to her last visit she underwent outpatient fistulogram with Dr.Lin on 10/13/2021. She was found to have no evidence of central stenosis or occlusion on the left arm.  There was no evidence of significant stenosis in her fistula. She was encouraged to continue conservative therapy with ACE wraps as needed. The discussion of ligation was had if the swelling progressed and was intolerable.     Today she presents with her husband***. She reports that the swelling worsened over past ****.  The patient's PMH, PSH, SH, and FamHx were reviewed today and are unchanged from her last visit in March.  Current Outpatient Medications  Medication Sig Dispense Refill   amLODipine (NORVASC) 10 MG tablet TAKE 1 TABLET BY MOUTH AT BEDTIME 90 tablet 0   anastrozole (ARIMIDEX) 1 MG tablet TAKE 1 TABLET(1 MG) BY MOUTH DAILY 30 tablet 4   aspirin EC 81 MG tablet Take 81 mg by mouth daily.     atorvastatin (LIPITOR) 20 MG tablet Take 1 tablet (20 mg total) by mouth daily. TAKE 1 TABLET DAILY 90 tablet 3   B Complex-C-Folic Acid (DIALYVITE 889) 0.8 MG TABS Take 1 tablet by mouth daily.     cinacalcet (SENSIPAR) 30 MG tablet Take 2 tablets (60 mg total) by mouth daily. (Patient taking differently: Take 30 mg by mouth daily.) 90 tablet 0   Darbepoetin Alfa (ARANESP, ALBUMIN FREE, IJ) Take once a week with dialysis unsure of dose     DULoxetine (CYMBALTA) 30 MG capsule  TAKE 1 CAPSULE(30 MG) BY MOUTH DAILY 90 capsule 1   gabapentin (NEURONTIN) 300 MG capsule TAKE 1 CAPSULE(300 MG) BY MOUTH AT BEDTIME (Patient taking differently: Take 300 mg by mouth at bedtime.) 90 capsule 1   HYDROcodone-acetaminophen (NORCO/VICODIN) 5-325 MG tablet Take 1 tablet by mouth every 6 (six) hours as needed. 20 tablet 0   isosorbide mononitrate (IMDUR) 30 MG 24 hr tablet Take 1 tablet (30 mg total) by mouth daily. 90 tablet 3   lanthanum (FOSRENOL) 1000 MG chewable tablet Chew 2,000-3,000 mg by mouth See admin instructions. Take 3 tablets (3000 mg) by mouth with meals and take 2 tablets (2000 mg) with snacks     lidocaine-prilocaine (EMLA) cream Apply 1 application  topically every Monday, Wednesday, and Friday with hemodialysis.     meclizine (ANTIVERT) 25 MG tablet TAKE 1 TABLET(25 MG) BY MOUTH THREE TIMES DAILY AS NEEDED FOR DIZZINESS 30 tablet 1   nebivolol (BYSTOLIC) 10 MG tablet Take 1 tablet (10 mg total) by mouth in the morning and at bedtime. 180 tablet 3   pantoprazole (PROTONIX) 40 MG tablet TAKE 1 TABLET(40 MG) BY MOUTH DAILY (Patient taking differently: Take 40 mg by mouth daily.) 30 tablet 0   scopolamine (TRANSDERM-SCOP) 1 MG/3DAYS Place 1 patch (1.5 mg total) onto the skin every 3 (three) days. Stop meclizine 10 patch 12   VITAMIN D PO Take by mouth.  No current facility-administered medications for this visit.    On ROS today: ***, ***   Physical Examination  There were no vitals filed for this visit. There is no height or weight on file to calculate BMI.  General {LOC:19197::"Somulent","Alert"}, {Orientation:19197::"Confused","O x 3"}, {Weight:19197::"Obese","Cachectic","WD"}, {General state of health:19197::"Ill appearing","Elderly","NAD"}  Pulmonary {Chest wall:19197::"Asx chest movement","Sym exp"}, {Air movt:19197::"Decreased *** air movt","good B air movt"}, {BS:19197::"rales on ***","rhonchi on ***","wheezing on ***","CTA B"}  Cardiac  {Rhythm:19197::"Irregularly, irregular rate and rhythm","RRR, Nl S1, S2"}, {Murmur:19197::"Murmur present: ***","no Murmurs"}, {Rubs:19197::"Rub present: ***","No rubs"}, {Gallop:19197::"Gallop present: ***","No S3,S4"}  Vascular Vessel Right Left  Radial {Palpable:19197::"Not palpable","Faintly palpable","Palpable"} {Palpable:19197::"Not palpable","Faintly palpable","Palpable"}  Brachial {Palpable:19197::"Not palpable","Faintly palpable","Palpable"} {Palpable:19197::"Not palpable","Faintly palpable","Palpable"}  Ulnar {Palpable:19197::"Not palpable","Faintly palpable","Palpable"} {Palpable:19197::"Not palpable","Faintly palpable","Palpable"}    Musculo- skeletal M/S 5/5 throughout {MS:19197::"except ***"," "}, Extremities without ischemic changes {MS:19197::"except ***"," "}  Neurologic A&O; CN grossly intact     Non-invasive Vascular Imaging   {side of body:30421359} Arm Access Duplex  (***):  Diameters:  *** mm Depth:  *** mm PSV:  *** c/s  BUE Doppler (***):  R arm:  Brachial: {Signals:19197::"none","mono","bi","tri"}, *** mm Radial: {Signals:19197::"none","mono","bi","tri"}, *** mm Ulnar: {Signals:19197::"none","mono","bi","tri"}, *** mm L arm:  Brachial: {Signals:19197::"none","mono","bi","tri"}, *** mm Radial: {Signals:19197::"none","mono","bi","tri"}, *** mm Ulnar: {Signals:19197::"none","mono","bi","tri"}, *** mm  BUE Vein Mapping  (***):  R arm: acceptable vein conduits include *** L arm: acceptable vein conduits include ***    Medical Decision Making   Megan [name] is a 61 y.o. female who presents with {KidneyDisease:19197::"ESRD","chronic kidney disease stage ***"} requiring hemodialysis.   ***   Karoline Caldwell, PA-C [facility] Office: [phone]  Clinic MD: ***

## 2022-04-17 DIAGNOSIS — N2581 Secondary hyperparathyroidism of renal origin: Secondary | ICD-10-CM | POA: Diagnosis not present

## 2022-04-17 DIAGNOSIS — D631 Anemia in chronic kidney disease: Secondary | ICD-10-CM | POA: Diagnosis not present

## 2022-04-17 DIAGNOSIS — Z992 Dependence on renal dialysis: Secondary | ICD-10-CM | POA: Diagnosis not present

## 2022-04-17 DIAGNOSIS — E1129 Type 2 diabetes mellitus with other diabetic kidney complication: Secondary | ICD-10-CM | POA: Diagnosis not present

## 2022-04-17 DIAGNOSIS — N186 End stage renal disease: Secondary | ICD-10-CM | POA: Diagnosis not present

## 2022-04-17 DIAGNOSIS — I159 Secondary hypertension, unspecified: Secondary | ICD-10-CM | POA: Diagnosis not present

## 2022-04-18 ENCOUNTER — Ambulatory Visit (HOSPITAL_COMMUNITY)
Admission: RE | Admit: 2022-04-18 | Discharge: 2022-04-18 | Disposition: A | Discharge status: Home / Self Care | Payer: Medicare Other | Source: Ambulatory Visit | Attending: Vascular Surgery | Admitting: Vascular Surgery

## 2022-04-18 ENCOUNTER — Ambulatory Visit (INDEPENDENT_AMBULATORY_CARE_PROVIDER_SITE_OTHER): Payer: Medicare Other | Admitting: Physician Assistant

## 2022-04-18 VITALS — BP 134/76 | HR 77 | Temp 97.7°F | Resp 20 | Ht 62.0 in | Wt 178.4 lb

## 2022-04-18 DIAGNOSIS — N186 End stage renal disease: Secondary | ICD-10-CM | POA: Insufficient documentation

## 2022-04-18 DIAGNOSIS — Z992 Dependence on renal dialysis: Secondary | ICD-10-CM | POA: Insufficient documentation

## 2022-04-19 DIAGNOSIS — Z992 Dependence on renal dialysis: Secondary | ICD-10-CM | POA: Diagnosis not present

## 2022-04-19 DIAGNOSIS — N2581 Secondary hyperparathyroidism of renal origin: Secondary | ICD-10-CM | POA: Diagnosis not present

## 2022-04-19 DIAGNOSIS — D631 Anemia in chronic kidney disease: Secondary | ICD-10-CM | POA: Diagnosis not present

## 2022-04-19 DIAGNOSIS — E1129 Type 2 diabetes mellitus with other diabetic kidney complication: Secondary | ICD-10-CM | POA: Diagnosis not present

## 2022-04-19 DIAGNOSIS — N186 End stage renal disease: Secondary | ICD-10-CM | POA: Diagnosis not present

## 2022-04-19 DIAGNOSIS — I159 Secondary hypertension, unspecified: Secondary | ICD-10-CM | POA: Diagnosis not present

## 2022-04-21 DIAGNOSIS — D631 Anemia in chronic kidney disease: Secondary | ICD-10-CM | POA: Diagnosis not present

## 2022-04-21 DIAGNOSIS — N186 End stage renal disease: Secondary | ICD-10-CM | POA: Diagnosis not present

## 2022-04-21 DIAGNOSIS — Z992 Dependence on renal dialysis: Secondary | ICD-10-CM | POA: Diagnosis not present

## 2022-04-21 DIAGNOSIS — N2581 Secondary hyperparathyroidism of renal origin: Secondary | ICD-10-CM | POA: Diagnosis not present

## 2022-04-21 DIAGNOSIS — E1129 Type 2 diabetes mellitus with other diabetic kidney complication: Secondary | ICD-10-CM | POA: Diagnosis not present

## 2022-04-21 DIAGNOSIS — I159 Secondary hypertension, unspecified: Secondary | ICD-10-CM | POA: Diagnosis not present

## 2022-04-24 DIAGNOSIS — Z992 Dependence on renal dialysis: Secondary | ICD-10-CM | POA: Diagnosis not present

## 2022-04-24 DIAGNOSIS — E1129 Type 2 diabetes mellitus with other diabetic kidney complication: Secondary | ICD-10-CM | POA: Diagnosis not present

## 2022-04-24 DIAGNOSIS — I159 Secondary hypertension, unspecified: Secondary | ICD-10-CM | POA: Diagnosis not present

## 2022-04-24 DIAGNOSIS — N186 End stage renal disease: Secondary | ICD-10-CM | POA: Diagnosis not present

## 2022-04-24 DIAGNOSIS — D631 Anemia in chronic kidney disease: Secondary | ICD-10-CM | POA: Diagnosis not present

## 2022-04-24 DIAGNOSIS — N2581 Secondary hyperparathyroidism of renal origin: Secondary | ICD-10-CM | POA: Diagnosis not present

## 2022-04-26 DIAGNOSIS — N2581 Secondary hyperparathyroidism of renal origin: Secondary | ICD-10-CM | POA: Diagnosis not present

## 2022-04-26 DIAGNOSIS — N186 End stage renal disease: Secondary | ICD-10-CM | POA: Diagnosis not present

## 2022-04-26 DIAGNOSIS — E1129 Type 2 diabetes mellitus with other diabetic kidney complication: Secondary | ICD-10-CM | POA: Diagnosis not present

## 2022-04-26 DIAGNOSIS — D631 Anemia in chronic kidney disease: Secondary | ICD-10-CM | POA: Diagnosis not present

## 2022-04-26 DIAGNOSIS — I159 Secondary hypertension, unspecified: Secondary | ICD-10-CM | POA: Diagnosis not present

## 2022-04-26 DIAGNOSIS — Z992 Dependence on renal dialysis: Secondary | ICD-10-CM | POA: Diagnosis not present

## 2022-04-27 ENCOUNTER — Other Ambulatory Visit: Payer: Self-pay

## 2022-04-27 ENCOUNTER — Encounter (HOSPITAL_COMMUNITY): Payer: Self-pay | Admitting: Emergency Medicine

## 2022-04-27 ENCOUNTER — Emergency Department (HOSPITAL_COMMUNITY)
Admission: EM | Admit: 2022-04-27 | Discharge: 2022-04-27 | Disposition: A | Discharge status: Home / Self Care | Payer: Medicare Other | Attending: Emergency Medicine | Admitting: Emergency Medicine

## 2022-04-27 ENCOUNTER — Telehealth: Payer: Self-pay

## 2022-04-27 DIAGNOSIS — K625 Hemorrhage of anus and rectum: Secondary | ICD-10-CM | POA: Diagnosis present

## 2022-04-27 DIAGNOSIS — N186 End stage renal disease: Secondary | ICD-10-CM | POA: Diagnosis not present

## 2022-04-27 DIAGNOSIS — K649 Unspecified hemorrhoids: Secondary | ICD-10-CM | POA: Insufficient documentation

## 2022-04-27 DIAGNOSIS — Z7982 Long term (current) use of aspirin: Secondary | ICD-10-CM | POA: Diagnosis not present

## 2022-04-27 DIAGNOSIS — R42 Dizziness and giddiness: Secondary | ICD-10-CM | POA: Diagnosis not present

## 2022-04-27 DIAGNOSIS — Z992 Dependence on renal dialysis: Secondary | ICD-10-CM | POA: Diagnosis not present

## 2022-04-27 LAB — CBC
HCT: 37.2 % (ref 36.0–46.0)
HCT: 35.6 % — ABNORMAL LOW (ref 36.0–46.0)
Hemoglobin: 11.9 g/dL — ABNORMAL LOW (ref 12.0–15.0)
Hemoglobin: 11.7 g/dL — ABNORMAL LOW (ref 12.0–15.0)
MCH: 33.1 pg (ref 26.0–34.0)
MCH: 34 pg (ref 26.0–34.0)
MCHC: 32 g/dL (ref 30.0–36.0)
MCHC: 32.9 g/dL (ref 30.0–36.0)
MCV: 103.6 fL — ABNORMAL HIGH (ref 80.0–100.0)
MCV: 103.5 fL — ABNORMAL HIGH (ref 80.0–100.0)
Platelets: 237 10*3/uL (ref 150–400)
Platelets: 249 10*3/uL (ref 150–400)
RBC: 3.59 MIL/uL — ABNORMAL LOW (ref 3.87–5.11)
RBC: 3.44 MIL/uL — ABNORMAL LOW (ref 3.87–5.11)
RDW: 15.4 % (ref 11.5–15.5)
RDW: 15.5 % (ref 11.5–15.5)
WBC: 8 10*3/uL (ref 4.0–10.5)
WBC: 9 10*3/uL (ref 4.0–10.5)
nRBC: 0 % (ref 0.0–0.2)
nRBC: 0 % (ref 0.0–0.2)

## 2022-04-27 LAB — TYPE AND SCREEN
ABO/RH(D): B POS
ABO/RH(D): B POS
Antibody Screen: NEGATIVE
Antibody Screen: NEGATIVE

## 2022-04-27 LAB — COMPREHENSIVE METABOLIC PANEL
ALT: 24 U/L (ref 0–44)
AST: 18 U/L (ref 15–41)
Albumin: 3.8 g/dL (ref 3.5–5.0)
Alkaline Phosphatase: 85 U/L (ref 38–126)
Anion gap: 15 (ref 5–15)
BUN: 46 mg/dL — ABNORMAL HIGH (ref 6–20)
CO2: 29 mmol/L (ref 22–32)
Calcium: 10.2 mg/dL (ref 8.9–10.3)
Chloride: 96 mmol/L — ABNORMAL LOW (ref 98–111)
Creatinine, Ser: 10.37 mg/dL — ABNORMAL HIGH (ref 0.44–1.00)
GFR, Estimated: 4 mL/min — ABNORMAL LOW (ref >60)
Glucose, Bld: 102 mg/dL — ABNORMAL HIGH (ref 70–99)
Potassium: 3.9 mmol/L (ref 3.5–5.1)
Sodium: 140 mmol/L (ref 135–145)
Total Bilirubin: 0.7 mg/dL (ref 0.3–1.2)
Total Protein: 7.8 g/dL (ref 6.5–8.1)

## 2022-04-27 MED ORDER — ACETAMINOPHEN 500 MG PO TABS
1000.0000 mg | ORAL_TABLET | Freq: Once | ORAL | Status: AC
  Administered 2022-04-27: 1000 mg via ORAL
  Filled 2022-04-27: qty 2

## 2022-04-27 NOTE — ED Provider Notes (Signed)
[facility] Provider Note   CSN: [id number] Arrival date & time: 04/27/22  1529     History {Add pertinent medical, surgical, social history, OB history to HPI:1} Chief Complaint  Patient presents with   Rectal Bleeding    Megan [name] is a 61 y.o. female.  Tuesday  5 today Red slushy last night No beets ASA no AC Mild abd pain Hx of banding of hemorrhoids ESRD MWF   Rectal Bleeding      Home Medications Prior to Admission medications   Medication Sig Start Date End Date Taking? Authorizing Provider  amLODipine (NORVASC) 10 MG tablet TAKE 1 TABLET BY MOUTH AT BEDTIME 02/06/22   [name], MD  anastrozole (ARIMIDEX) 1 MG tablet TAKE 1 TABLET(1 MG) BY MOUTH DAILY 02/28/22   Derek Jack, MD  aspirin EC 81 MG tablet Take 81 mg by mouth daily.    [provider]  atorvastatin (LIPITOR) 20 MG tablet Take 1 tablet (20 mg total) by mouth daily. TAKE 1 TABLET DAILY 03/02/22   [name], MD  B Complex-C-Folic Acid (DIALYVITE 166) 0.8 MG TABS Take 1 tablet by mouth daily. 01/22/18   [provider]  cinacalcet (SENSIPAR) 30 MG tablet Take 2 tablets (60 mg total) by mouth daily. Patient taking differently: Take 30 mg by mouth daily. 06/13/18   [name], MD  Darbepoetin Alfa (ARANESP, ALBUMIN FREE, IJ) Take once a week with dialysis unsure of dose 07/01/21 06/30/22  [provider]  DULoxetine (CYMBALTA) 30 MG capsule TAKE 1 CAPSULE(30 MG) BY MOUTH DAILY 04/03/22   [name], MD  gabapentin (NEURONTIN) 300 MG capsule TAKE 1 CAPSULE(300 MG) BY MOUTH AT BEDTIME Patient taking differently: Take 300 mg by mouth at bedtime. 01/10/22   [name], MD  HYDROcodone-acetaminophen (NORCO/VICODIN) 5-325 MG tablet Take 1 tablet by mouth every 6 (six) hours as needed. 03/31/22   [name], MD  isosorbide mononitrate (IMDUR) 30 MG 24 hr tablet Take 1 tablet (30 mg total) by mouth daily.  04/13/22   [name], MD  lanthanum (FOSRENOL) 1000 MG chewable tablet Chew 2,000-3,000 mg by mouth See admin instructions. Take 3 tablets (3000 mg) by mouth with meals and take 2 tablets (2000 mg) with snacks 10/16/19   [provider]  lidocaine-prilocaine (EMLA) cream Apply 1 application  topically every Monday, Wednesday, and Friday with hemodialysis. 09/24/18   [provider]  meclizine (ANTIVERT) 25 MG tablet TAKE 1 TABLET(25 MG) BY MOUTH THREE TIMES DAILY AS NEEDED FOR DIZZINESS 03/21/22   [name], MD  nebivolol (BYSTOLIC) 10 MG tablet Take 1 tablet (10 mg total) by mouth in the morning and at bedtime. 04/13/22   [name], MD  pantoprazole (PROTONIX) 40 MG tablet TAKE 1 TABLET(40 MG) BY MOUTH DAILY Patient taking differently: Take 40 mg by mouth daily. 01/20/22   [name], MD  scopolamine (TRANSDERM-SCOP) 1 MG/3DAYS Place 1 patch (1.5 mg total) onto the skin every 3 (three) days. Stop meclizine 03/02/22   [name], MD  VITAMIN D PO Take by mouth. 08/24/21 08/23/22  [provider]      Allergies    Patient has no known allergies.    Review of Systems   Review of Systems  Gastrointestinal:  Positive for hematochezia.    Physical Exam Updated Vital Signs BP (!) 156/80   Pulse 74   Temp 98.8 F (37.1 C) (Oral)   Resp Marland Kitchen)  8   Ht '5\' 2"'$  (1.575 m)   Wt 78.9 kg   SpO2 97%   BMI 31.83 kg/m  Physical Exam  ED Results / Procedures / Treatments   Labs (all labs ordered are listed, but only abnormal results are displayed) Labs Reviewed  COMPREHENSIVE METABOLIC PANEL - Abnormal; Notable for the following components:      Result Value   Chloride 96 (*)    Glucose, Bld 102 (*)    BUN 46 (*)    Creatinine, Ser 10.37 (*)    GFR, Estimated 4 (*)    All other components within normal limits  CBC - Abnormal; Notable for the following components:   RBC 3.59 (*)    Hemoglobin 11.9 (*)    MCV 103.6 (*)    All other  components within normal limits  POC OCCULT BLOOD, ED  TYPE AND SCREEN  TYPE AND SCREEN    EKG EKG Interpretation  Date/Time:  Thursday April 27 2022 15:51:13 EDT Ventricular Rate:  86 PR Interval:  150 QRS Duration: 88 QT Interval:  386 QTC Calculation: 461 R Axis:   -12 Text Interpretation: Normal sinus rhythm Possible Left atrial enlargement Left ventricular hypertrophy with repolarization abnormality ( Cornell product ) Abnormal ECG When compared with ECG of 26-Feb-2022 09:37, PREVIOUS ECG IS PRESENT Confirmed by Margaretmary Eddy [phone]) on 04/27/2022 7:32:48 PM  Radiology No results found.  Procedures Procedures  {Document cardiac monitor, telemetry assessment procedure when appropriate:1}  Medications Ordered in ED Medications - No data to display  ED Course/ Medical Decision Making/ A&P                           Medical Decision Making Amount and/or Complexity of Data Reviewed Labs: ordered.   ***  {Document critical care time when appropriate:1} {Document review of labs and clinical decision tools ie heart score, Chads2Vasc2 etc:1}  {Document your independent review of radiology images, and any outside records:1} {Document your discussion with family members, caretakers, and with consultants:1} {Document social determinants of health affecting pt's care:1} {Document your decision making why or why not admission, treatments were needed:1} Final Clinical Impression(s) / ED Diagnoses Final diagnoses:  None    Rx / DC Orders ED Discharge Orders     None

## 2022-04-27 NOTE — ED Triage Notes (Signed)
Pt presents for rectal bleeding since Tuesday, sent by PCP to check Hgb.

## 2022-04-27 NOTE — Telephone Encounter (Signed)
Returned the pt's call and the pt stated she called week of Aug 20th regarding that she had begun to bleed again. Every since that Tuesday the 21st she has bleeding everyday with and without BM. States she is very weak and fatigued. Please advise

## 2022-04-27 NOTE — ED Notes (Signed)
Pt c/o frontal and temple headache and c/o generalized abdominal pain. Abdominal going for the last few weeks. Nausea present. Megan [name]

## 2022-04-27 NOTE — ED Notes (Signed)
EDP Paterson at bedside. This RN was a chaperon for rectal exam. Hemoccult positive. Bryson Corona Edd Fabian

## 2022-04-27 NOTE — Telephone Encounter (Signed)
FYI:  Phoned and spoke with the pt and the pt is making her way to the ED to be evaluated.

## 2022-04-27 NOTE — ED Notes (Signed)
Pt hypertensive with headache.She is requesting Tylenol.  EDP Paterson informed. Bryson Corona Edd Fabian

## 2022-04-27 NOTE — Telephone Encounter (Signed)
If weakness, fatigue, shortness of breath more than baseline, needs to go to the ED.

## 2022-04-27 NOTE — Discharge Instructions (Signed)
Today you were seen in the emergency department for your your rectal bleeding.    In the emergency department you were found to have hemorrhoids and your blood counts were normal.    At home, please use a stool softener and do not drink any red-colored foods.  You may also follow the other instructions for hemorrhoid care in this packet.    Follow-up with your primary doctor in 2-3 days regarding your visit.  Follow-up with GI clinic within the next week for evaluation of your hemorrhoids.  Return immediately to the emergency department if you experience any of the following: Worsening bleeding, dizziness or fainting, shortness of breath, or any other concerning symptoms.    Thank you for visiting our Emergency Department. It was a pleasure taking care of you today.

## 2022-04-29 DIAGNOSIS — D631 Anemia in chronic kidney disease: Secondary | ICD-10-CM | POA: Diagnosis not present

## 2022-04-29 DIAGNOSIS — E1129 Type 2 diabetes mellitus with other diabetic kidney complication: Secondary | ICD-10-CM | POA: Diagnosis not present

## 2022-04-29 DIAGNOSIS — N186 End stage renal disease: Secondary | ICD-10-CM | POA: Diagnosis not present

## 2022-04-29 DIAGNOSIS — N2581 Secondary hyperparathyroidism of renal origin: Secondary | ICD-10-CM | POA: Diagnosis not present

## 2022-04-29 DIAGNOSIS — Z992 Dependence on renal dialysis: Secondary | ICD-10-CM | POA: Diagnosis not present

## 2022-04-29 DIAGNOSIS — I159 Secondary hypertension, unspecified: Secondary | ICD-10-CM | POA: Diagnosis not present

## 2022-05-01 ENCOUNTER — Ambulatory Visit (INDEPENDENT_AMBULATORY_CARE_PROVIDER_SITE_OTHER): Payer: Medicare Other | Admitting: Gastroenterology

## 2022-05-01 ENCOUNTER — Encounter: Payer: Self-pay | Admitting: Gastroenterology

## 2022-05-01 VITALS — BP 92/58 | HR 98 | Temp 97.1°F | Ht 62.0 in | Wt 176.6 lb

## 2022-05-01 DIAGNOSIS — K640 First degree hemorrhoids: Secondary | ICD-10-CM | POA: Diagnosis not present

## 2022-05-01 DIAGNOSIS — K625 Hemorrhage of anus and rectum: Secondary | ICD-10-CM | POA: Diagnosis not present

## 2022-05-01 DIAGNOSIS — D631 Anemia in chronic kidney disease: Secondary | ICD-10-CM | POA: Diagnosis not present

## 2022-05-01 DIAGNOSIS — Z992 Dependence on renal dialysis: Secondary | ICD-10-CM | POA: Diagnosis not present

## 2022-05-01 DIAGNOSIS — I159 Secondary hypertension, unspecified: Secondary | ICD-10-CM | POA: Diagnosis not present

## 2022-05-01 DIAGNOSIS — N186 End stage renal disease: Secondary | ICD-10-CM | POA: Diagnosis not present

## 2022-05-01 DIAGNOSIS — N2581 Secondary hyperparathyroidism of renal origin: Secondary | ICD-10-CM | POA: Diagnosis not present

## 2022-05-01 DIAGNOSIS — E1129 Type 2 diabetes mellitus with other diabetic kidney complication: Secondary | ICD-10-CM | POA: Diagnosis not present

## 2022-05-01 NOTE — Patient Instructions (Signed)
On exam today you do have some ulceration noted to prior banding sites.  There is a mild amount of excess hemorrhoid tissue that does not appear to have been actively bleeding.  Given excess toilet time recently, this could be causing loosening of your hemorrhoid columns.  We could pursue further hemorrhoid banding in the future.  Given that your symptoms are intermittent I do not see the need to perform another colonoscopy at this time.  We should follow good hemorrhoid precautions for now continue to monitor.  It is okay to use Preparation H over-the-counter as needed.  If you begin to have any lightheadedness, dizziness, syncope, or large amounts of rectal bleeding please proceed to the ED.  Most recent hemoglobin was stable at your ED visit.  Continue to avoid straining. Limit toilet time to 2-3 minutes at the most (this is most important to continue long-term as we can continue to damage the hemorrhoid tissue by spending long amounts of time on the commode). Avoid constipation. Take 2 tablespoons of natural wheat bran, natural oat bran, flax, Benefiber or any over the counter fiber supplement and increase your water intake to 7-8 glasses daily.  Please keep your upcoming appointment in November.  It was a pleasure to see you today. I want to create trusting relationships with patients. If you receive a survey regarding your visit,  I greatly appreciate you taking time to fill this out on paper or through your MyChart. I value your feedback.  Venetia Night, MSN, FNP-BC, AGACNP-BC [facility]

## 2022-05-01 NOTE — Progress Notes (Cosign Needed Addendum)
GI Office Note    Referring Provider: [name], MD Primary Care Physician:  [name], MD Primary Gastroenterologist: Dr. [name]  Date:  05/01/2022  ID:  Megan [name], DOB [birth year], MRN [id number]   Chief Complaint   Chief Complaint  Patient presents with   Rectal Bleeding    Was seen in the ER on Thursday. Seen blood in toilet today.     History of Present Illness  Megan [name] is a 61 y.o. female with a history of internal hemorrhoids s/p banding in 2021 and recently in April/May 2023, [place] in July 2022, anemia, HFpEF, breast cancer, GERD, HTN, ESRD on dialysis, CAD, HLD, and diabetes presenting today with recent rectal bleeding.   Last EGD November 2022: Normal esophagus, small hiatal hernia, 25 mm pedunculated gastric polyp, clipped, normal duodenum.  Pathology revealed hyperplastic polyp.  Advised Protonix 40 mg every day instead of as needed.  Last colonoscopy in November 2022: Nonbleeding internal hemorrhoids described as moderate.  Prior hemorrhoid band scarring present.  Advise repeat colonoscopy in 10 years.  Advised to follow-up in the office for hemorrhoid banding.  Recently underwent hemorrhoid banding on 12/07/2021, 12/22/2021, and 01/05/2022.  She has had all 3 hemorrhoid columns banded.  Endoscopy performed on 12/22/2021 noting grade 2/3 hemorrhoids in the left lateral and right posterior position with oozing from both sites as well as prior well-healed right anterior hemorrhoid site.  Patient called into the office the end of August stating she began to have rectal bleeding with and without a bowel movement also reporting that she felt very weak and fatigued.  She presented to the ED 04/27/2022 and reported that on 04/25/2022 she had 5 bowel movements with a fair amount of blood in the toilet.  She denied any red foods other than noting flushing the [name] prior.  She also denies any melena.  She did report generalized abdominal pain.  Also  reports a history of vertigo and stated she has recently been slightly dizzy but denies any chest pain or shortness of breath.  Her hemoglobin was stable at 11.9 subsequently 11.7.  POC occult blood positive.  She did not have any severe bleeding in the ED.  She was advised to follow-up outpatient in 2-3 days per Dr. Jenetta Downer and is felt that her rectal bleeding was likely due to her hemorrhoids.   Today: Small amount today and some last week on tuesday. Non in between. No squatting or trouble going to the bathroom. First episode at the end of august and then again last week. Last Tuesday it was a lot. Did have some mid abdominal pain with the bleeding. Does have seem intermittent rectal itching but no pain. Has some low blood pressures at dialysis but none at home. Did not take BP medications today. Sees nephrologist about once per week or every other week. Denies melena. Does have some reflux but fairly controlled on daily dosing. Denies dysphagia, early satiety. Has lack of appetite since being on dialysis. Has regular bowel movements - not having to strain. Not using anything over the counter.  She does admit to spending more than a couple of minutes on the commode at a time, admits to sitting down and watching videos or playing games on her phone while on the toilet.  Waiting until 2024 to get back on the kidney transplant list due to her history of breast cancer. Had double mastectomy.   Current Outpatient Medications  Medication Sig Dispense Refill   amLODipine (  NORVASC) 10 MG tablet TAKE 1 TABLET BY MOUTH AT BEDTIME 90 tablet 0   anastrozole (ARIMIDEX) 1 MG tablet TAKE 1 TABLET(1 MG) BY MOUTH DAILY (Patient taking differently: Take 1 mg by mouth daily.) 30 tablet 4   aspirin EC 81 MG tablet Take 81 mg by mouth daily.     atorvastatin (LIPITOR) 20 MG tablet Take 1 tablet (20 mg total) by mouth daily. TAKE 1 TABLET DAILY 90 tablet 3   B Complex-C-Folic Acid (DIALYVITE 001) 0.8 MG TABS Take 1  tablet by mouth daily.     cinacalcet (SENSIPAR) 30 MG tablet Take 2 tablets (60 mg total) by mouth daily. (Patient taking differently: Take 30 mg by mouth daily.) 90 tablet 0   Darbepoetin Alfa (ARANESP, ALBUMIN FREE, IJ) Take once a week with dialysis unsure of dose     DULoxetine (CYMBALTA) 30 MG capsule TAKE 1 CAPSULE(30 MG) BY MOUTH DAILY (Patient taking differently: Take 30 mg by mouth daily.) 90 capsule 1   gabapentin (NEURONTIN) 300 MG capsule TAKE 1 CAPSULE(300 MG) BY MOUTH AT BEDTIME (Patient taking differently: Take 300 mg by mouth at bedtime.) 90 capsule 1   HYDROcodone-acetaminophen (NORCO/VICODIN) 5-325 MG tablet Take 1 tablet by mouth every 6 (six) hours as needed. 20 tablet 0   isosorbide mononitrate (IMDUR) 30 MG 24 hr tablet Take 1 tablet (30 mg total) by mouth daily. 90 tablet 3   lanthanum (FOSRENOL) 1000 MG chewable tablet Chew 2,000-3,000 mg by mouth See admin instructions. Take 3 tablets (3000 mg) by mouth with meals and take 2 tablets (2000 mg) with snacks     lidocaine-prilocaine (EMLA) cream Apply 1 application  topically every Monday, Wednesday, and Friday with hemodialysis.     meclizine (ANTIVERT) 25 MG tablet TAKE 1 TABLET(25 MG) BY MOUTH THREE TIMES DAILY AS NEEDED FOR DIZZINESS (Patient taking differently: Take 25 mg by mouth 3 (three) times daily as needed for dizziness or nausea.) 30 tablet 1   nebivolol (BYSTOLIC) 10 MG tablet Take 1 tablet (10 mg total) by mouth in the morning and at bedtime. 180 tablet 3   pantoprazole (PROTONIX) 40 MG tablet TAKE 1 TABLET(40 MG) BY MOUTH DAILY (Patient taking differently: Take 40 mg by mouth daily.) 30 tablet 0   VITAMIN D PO Take by mouth.     No current facility-administered medications for this visit.    Past Medical History:  Diagnosis Date   (HFpEF) heart failure with preserved ejection fraction ([place])    a. 01/2019 Echo: EF 55-60%, mild conc LVH. DD.  Torn MV chordae.   Anemia    Atypical chest pain    a. 08/2018 MV: EF  59%, no ischemia; b. 02/2019 Cath: nonobs dzs.   Blood transfusion without reported diagnosis    Breast cancer ([place]) 10/12/2020   Cataract    ESRD (end stage renal disease) on dialysis [facility])    a. HD T, T, S   Essential hypertension, benign    GERD (gastroesophageal reflux disease)    Headache    Hemorrhoids    Mixed hyperlipidemia    Morbid obesity (HCC)    Non-obstructive CAD (coronary artery disease)    a. 02/2019 CathL LM nl, LAD 28m LCX nl, RCA 25p, EF 55-65%.   PONV (postoperative nausea and vomiting)    S/P colonoscopy Jan 2011   Dr. 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 ([facility]    Type 2 diabetes mellitus ([facility]    Wears  glasses     Past Surgical History:  Procedure Laterality Date   ABDOMINAL HYSTERECTOMY     APPENDECTOMY     ARTERY BIOPSY N/A 05/09/2018   Procedure: RIGHT TEMPORAL ARTERY BIOPSY;  Surgeon: Judeth Horn, MD;  Location: [facility];  Service: General;  Laterality: N/A;   BREAST BIOPSY Right 06/15/2020   Procedure: RIGHT BREAST BIOPSY;  Surgeon: Aviva Signs, MD;  Location: [facility];  Service: General;  Laterality: Right;   CATARACT EXTRACTION W/PHACO Left 02/09/2017   Procedure: CATARACT EXTRACTION PHACO AND INTRAOCULAR LENS PLACEMENT LEFT EYE;  Surgeon: Tonny Branch, MD;  Location: [facility];  Service: Ophthalmology;  Laterality: Left;  CDE: 4.89   CATARACT EXTRACTION W/PHACO Right 06/04/2017   Procedure: CATARACT EXTRACTION PHACO AND INTRAOCULAR LENS PLACEMENT (IOC);  Surgeon: Tonny Branch, MD;  Location: [facility];  Service: Ophthalmology;  Laterality: Right;  CDE: 4.12   CHOLECYSTECTOMY  09/29/2011   Procedure: LAPAROSCOPIC CHOLECYSTECTOMY;  Surgeon: Jamesetta So, MD;  Location: [facility];  Service: General;  Laterality: N/A;   COLONOSCOPY  08/2009   Dr. Benson Norway: sessile polyp (benign lymphoid), large hemorrhoids, repeat 5-10 years   COLONOSCOPY N/A 06/12/2016   prominent hemorrhoids   COLONOSCOPY WITH PROPOFOL  N/A 06/16/2021   Procedure: COLONOSCOPY WITH PROPOFOL;  Surgeon: Daneil Dolin, MD;  [facility];  Service: Endoscopy;  Laterality: N/A;  9:30am (dialysis pt)   ESOPHAGOGASTRODUODENOSCOPY  09/05/2011   NLG:[id number] hiatal hernia; remainder of exam normal. No explanation for patient's abdominal pain with today's examination   ESOPHAGOGASTRODUODENOSCOPY N/A 12/17/2013   Dr. [name]: gastric erythema, erosion, mild chronic inflammation on path    ESOPHAGOGASTRODUODENOSCOPY (EGD) WITH PROPOFOL N/A 06/16/2021   Procedure: ESOPHAGOGASTRODUODENOSCOPY (EGD) WITH PROPOFOL;  Surgeon: Daneil Dolin, MD;  [facility];  Service: Endoscopy;  Laterality: N/A;   EXCISION OF BREAST BIOPSY Right 10/12/2020   Procedure: EXCISION OF RIGHT BREAST BIOPSY;  Surgeon: Aviva Signs, MD;  Location: [facility];  Service: General;  Laterality: Right;   LAPAROSCOPIC APPENDECTOMY  09/29/2011   Procedure: APPENDECTOMY LAPAROSCOPIC;  Surgeon: Jamesetta So, MD;  Location: [facility];  Service: General;;  incidental appendectomy   LEFT HEART CATH AND CORONARY ANGIOGRAPHY N/A 02/28/2019   Procedure: LEFT HEART CATH AND CORONARY ANGIOGRAPHY;  Surgeon: Jettie Booze, MD;  Location: [facility] [facility];  Service: Cardiovascular;  Laterality: N/A;   MASTECTOMY MODIFIED RADICAL Right 02/18/2020   Procedure: MASTECTOMY MODIFIED RADICAL;  Surgeon: Aviva Signs, MD;  Location: [facility];  Service: General;  Laterality: Right;   MASTECTOMY, PARTIAL Right 07/13/2020   Procedure: RIGHT PARTIAL MASTECTOMY;  Surgeon: Aviva Signs, MD;  Location: [facility];  Service: General;  Laterality: Right;   PARTIAL MASTECTOMY WITH NEEDLE LOCALIZATION AND AXILLARY SENTINEL LYMPH NODE BX Right 09/18/2018   Procedure: RIGHT PARTIAL MASTECTOMY AFTER NEEDLE LOCALIZATION, SENTINEL LYMPH NODE BIOPSY RIGHT AXILLA;  Surgeon: Aviva Signs, MD;  Location: [facility];  Service: General;  Laterality: Right;   POLYPECTOMY  06/16/2021   Procedure:  POLYPECTOMY;  Surgeon: Daneil Dolin, MD;  [facility];  Service: Endoscopy;;   SIMPLE MASTECTOMY WITH AXILLARY SENTINEL NODE BIOPSY Left 06/15/2020   Procedure: LEFT SIMPLE MASTECTOMY;  Surgeon: Aviva Signs, MD;  Location: [facility];  Service: General;  Laterality: Left;    Family History  Problem Relation Age of Onset   Hypertension Mother    Coronary artery disease Mother    Diabetes Mother    Hypertension Sister    Coronary artery  disease Sister    Hypertension Brother    Heart attack Father    Hypertension Son    Heart attack Maternal Aunt    Hypertension Maternal Aunt    Diabetes Maternal Aunt    Heart attack Maternal Uncle    Hypertension Maternal Uncle    Diabetes Maternal Uncle    Heart attack Paternal Aunt    Hypertension Paternal Aunt    Diabetes Paternal Aunt    Heart attack Paternal Uncle    Hypertension Paternal Uncle    Diabetes Paternal Uncle    Heart attack Maternal Grandmother    Heart attack Maternal Grandfather    Heart attack Paternal Grandmother    Heart attack Paternal Grandfather    Colon cancer Neg Hx     Allergies as of 05/01/2022   (No Known Allergies)    Social History   Socioeconomic History   Marital status: Married    Spouse name: Not on file   Number of children: Not on file   Years of education: Not on file   Highest education level: Not on file  Occupational History   Occupation: Merchandiser, retail: [employer]  Tobacco Use   Smoking status: Never    Passive exposure: Never   Smokeless tobacco: Never  Vaping Use   Vaping Use: Never used  Substance and Sexual Activity   Alcohol use: No   Drug use: No   Sexual activity: Yes    Birth control/protection: Surgical  Other Topics Concern   Not on file  Social History Narrative   Works at [employer] in [place] [employer].    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   Also has to get items from high shelves-causes achy pain in shoulder area      Married.    Children are grown, out of house.   Social Determinants of Health   Financial Resource Strain: Low Risk  (09/09/2021)   Overall Financial Resource Strain (CARDIA)    Difficulty of Paying Living Expenses: Not hard at all  Food Insecurity: No Food Insecurity (09/09/2021)   Hunger Vital Sign    Worried About Running Out of Food in the Last Year: Never true    Ran Out of Food in the Last Year: Never true  Transportation Needs: No Transportation Needs (09/09/2021)   PRAPARE - Hydrologist (Medical): No    Lack of Transportation (Non-Medical): No  Physical Activity: Inactive (09/09/2021)   Exercise Vital Sign    Days of Exercise per Week: 0 days    Minutes of Exercise per Session: 0 min  Stress: No Stress Concern Present (09/09/2021)   [place]    Feeling of Stress : Not at all  Social Connections: [place] (09/09/2021)   Social Connection and Isolation Panel [NHANES]    Frequency of Communication with Friends and Family: More than three times a week    Frequency of Social Gatherings with Friends and Family: More than three times a week    Attends Religious Services: More than 4 times per year    Active Member of [employer] or Organizations: Yes    Attends Music therapist: More than 4 times per year    Marital Status: Married     Review of Systems   Gen: Denies fever, chills, anorexia. Denies fatigue, weakness, weight loss.  CV: Denies chest pain, palpitations, syncope, peripheral edema,  and claudication. Resp: Denies dyspnea at rest, cough, wheezing, coughing up blood, and pleurisy. GI: See HPI Derm: Denies rash, itching, dry skin Psych: Denies depression, anxiety, memory loss, confusion. No homicidal or suicidal ideation.  Heme: Denies bruising, bleeding, and enlarged lymph nodes.   Physical Exam   BP (!) 92/58 (BP Location: Left Arm, Patient Position: Sitting,  Cuff Size: Normal)   Pulse 98   Temp (!) 97.1 F (36.2 C) (Temporal)   Ht '5\' 2"'$  (1.575 m)   Wt 176 lb 9.6 oz (80.1 kg)   SpO2 95%   BMI 32.30 kg/m   General:   Alert and oriented. No distress noted. Pleasant and cooperative.  Head:  Normocephalic and atraumatic. Eyes:  Conjuctiva clear without scleral icterus. Abdomen:  +BS, soft, non-tender and non-distended. No rebound or guarding. No HSM or masses noted. Rectal: Skin tags present.  Anoscopy performed: Small areas of ulceration noted to the right anterior and left lateral position.  Some loose hemorrhoidal tissue noted but does not appear to be looking at the band..  Does appear vascularized. Msk:  Symmetrical without gross deformities. Normal posture. Extremities:  Without edema. Neurologic:  Alert and  oriented x4 Psych:  Alert and cooperative. Normal mood and affect.   Assessment  MADELENA MATURIN is a 61 y.o. female with a history of internal hemorrhoids s/p banding in 2021 and recently in April/May 2023, [place] in July 2022, anemia, HFpEF, breast cancer, GERD, HTN, ESRD on dialysis, CAD, HLD, and diabetes presenting today with recent rectal bleeding.   Rectal bleeding/hemorrhoids: Patient have recurrent rectal bleeding beginning of August.  Went to the ED 04/27/2022 due to multiple bloody bowel movements with associated weakness.  She denies any shortness of breath or chest pain.  Hemoglobin was found to be stable at 11.7/11.9 and had positive Hemoccult stool.  Appears her baseline is in the 10 range as she has ESRD and on dialysis.  Last colonoscopy in November 2022 only noting internal hemorrhoids.  She has underwent 3 previous hemorrhoid banding sessions in April/May.  Last anoscopy performed 12/22/2021 noting grade 2/3 hemorrhoids with active oozing.  She reports some occasional rectal itching but no pain.  She is only had a couple of the episodes of bleeding since her last hemorrhoid banding, but did note a large amount of blood  last week.  Symptoms are not daily and are only intermittent and usually very small amounts. She reports some occasional issues with lower blood pressures, usually occurs with dialysis.  Typically her blood pressures at home are not as low.  Anoscopy performed today revealing small areas of ulceration, likely from prior banding's without any active bleeding visualized.  She has a small amount of loose tissue, but does not appear to be enough to perform any repeat banding.  She also currently states she is not interested in repeat banding.  After further conversations she does admit to spending longer periods of time on the commode which despite prior banding more than likely has caused loosening of her hemorrhoidal column tissue and likely the cause of her intermittent rectal bleeding.  We discussed follow-up ED precautions as well as other good hemorrhoid precautions.  We discussed over-the-counter Preparation H or prescription Anusol cream as needed to help with ongoing symptoms.  She already has scheduled follow-up in November with Roseanne Kaufman, NP which I have encouraged her to keep for now.  Unable to completely rule out possible ischemic colitis given her low blood pressures, however she is not experiencing  any abdominal pain.  For now we will continue to monitor and she is to contact the office if ongoing or worsening.   PLAN   Continue to monitor for overt bleeding, ED precautions discussed. Could consider additional hemorrhoid banding in the future if needed. Good hemorrhoid precautions Keep upcoming appointment in November with Roseanne Kaufman, NP    Megan Night, MSN, FNP-BC, AGACNP-BC [facility]

## 2022-05-02 DIAGNOSIS — M629 Disorder of muscle, unspecified: Secondary | ICD-10-CM | POA: Diagnosis not present

## 2022-05-02 DIAGNOSIS — R102 Pelvic and perineal pain: Secondary | ICD-10-CM | POA: Diagnosis not present

## 2022-05-02 DIAGNOSIS — Z124 Encounter for screening for malignant neoplasm of cervix: Secondary | ICD-10-CM | POA: Diagnosis not present

## 2022-05-02 DIAGNOSIS — Z6832 Body mass index (BMI) 32.0-32.9, adult: Secondary | ICD-10-CM | POA: Diagnosis not present

## 2022-05-02 DIAGNOSIS — N952 Postmenopausal atrophic vaginitis: Secondary | ICD-10-CM | POA: Diagnosis not present

## 2022-05-02 DIAGNOSIS — Z1272 Encounter for screening for malignant neoplasm of vagina: Secondary | ICD-10-CM | POA: Diagnosis not present

## 2022-05-02 DIAGNOSIS — C50111 Malignant neoplasm of central portion of right female breast: Secondary | ICD-10-CM | POA: Insufficient documentation

## 2022-05-03 DIAGNOSIS — N186 End stage renal disease: Secondary | ICD-10-CM | POA: Diagnosis not present

## 2022-05-03 DIAGNOSIS — D631 Anemia in chronic kidney disease: Secondary | ICD-10-CM | POA: Diagnosis not present

## 2022-05-03 DIAGNOSIS — I159 Secondary hypertension, unspecified: Secondary | ICD-10-CM | POA: Diagnosis not present

## 2022-05-03 DIAGNOSIS — Z992 Dependence on renal dialysis: Secondary | ICD-10-CM | POA: Diagnosis not present

## 2022-05-03 DIAGNOSIS — E1129 Type 2 diabetes mellitus with other diabetic kidney complication: Secondary | ICD-10-CM | POA: Diagnosis not present

## 2022-05-03 DIAGNOSIS — N2581 Secondary hyperparathyroidism of renal origin: Secondary | ICD-10-CM | POA: Diagnosis not present

## 2022-05-05 DIAGNOSIS — I159 Secondary hypertension, unspecified: Secondary | ICD-10-CM | POA: Diagnosis not present

## 2022-05-05 DIAGNOSIS — E1129 Type 2 diabetes mellitus with other diabetic kidney complication: Secondary | ICD-10-CM | POA: Diagnosis not present

## 2022-05-05 DIAGNOSIS — N2581 Secondary hyperparathyroidism of renal origin: Secondary | ICD-10-CM | POA: Diagnosis not present

## 2022-05-05 DIAGNOSIS — D631 Anemia in chronic kidney disease: Secondary | ICD-10-CM | POA: Diagnosis not present

## 2022-05-05 DIAGNOSIS — N186 End stage renal disease: Secondary | ICD-10-CM | POA: Diagnosis not present

## 2022-05-05 DIAGNOSIS — Z992 Dependence on renal dialysis: Secondary | ICD-10-CM | POA: Diagnosis not present

## 2022-05-08 ENCOUNTER — Telehealth: Payer: Self-pay

## 2022-05-08 DIAGNOSIS — E1129 Type 2 diabetes mellitus with other diabetic kidney complication: Secondary | ICD-10-CM | POA: Diagnosis not present

## 2022-05-08 DIAGNOSIS — D631 Anemia in chronic kidney disease: Secondary | ICD-10-CM | POA: Diagnosis not present

## 2022-05-08 DIAGNOSIS — Z992 Dependence on renal dialysis: Secondary | ICD-10-CM | POA: Diagnosis not present

## 2022-05-08 DIAGNOSIS — N186 End stage renal disease: Secondary | ICD-10-CM | POA: Diagnosis not present

## 2022-05-08 DIAGNOSIS — I159 Secondary hypertension, unspecified: Secondary | ICD-10-CM | POA: Diagnosis not present

## 2022-05-08 DIAGNOSIS — N2581 Secondary hyperparathyroidism of renal origin: Secondary | ICD-10-CM | POA: Diagnosis not present

## 2022-05-08 NOTE — Telephone Encounter (Signed)
        Patient  visited [facility] on 04/27/2022  for hemorrhoids, lower GI bleed, symptomatic anemia.   Telephone encounter attempt :  1st  Unable to leave message, voicemail does not  pickup.   [place] Resource Care Guide   ??[email or web address]  ?? [phone]   Website: [email or web address]  [email or web address]  "We don't say no, we SHOW how!"         [facility]

## 2022-05-10 ENCOUNTER — Telehealth: Payer: Self-pay

## 2022-05-10 DIAGNOSIS — N186 End stage renal disease: Secondary | ICD-10-CM | POA: Diagnosis not present

## 2022-05-10 DIAGNOSIS — D631 Anemia in chronic kidney disease: Secondary | ICD-10-CM | POA: Diagnosis not present

## 2022-05-10 DIAGNOSIS — I159 Secondary hypertension, unspecified: Secondary | ICD-10-CM | POA: Diagnosis not present

## 2022-05-10 DIAGNOSIS — N2581 Secondary hyperparathyroidism of renal origin: Secondary | ICD-10-CM | POA: Diagnosis not present

## 2022-05-10 DIAGNOSIS — E1129 Type 2 diabetes mellitus with other diabetic kidney complication: Secondary | ICD-10-CM | POA: Diagnosis not present

## 2022-05-10 DIAGNOSIS — Z992 Dependence on renal dialysis: Secondary | ICD-10-CM | POA: Diagnosis not present

## 2022-05-10 NOTE — Telephone Encounter (Signed)
        Patient  visited [facility] on 04/27/2022  for hemorrhoids, lower GI bleed, symptomatic anemia.   Telephone encounter attempt :  2nd  A HIPAA compliant voice message was left requesting a return call.  Instructed patient to call back at [phone].   [place] Resource Care Guide   ??[email or web address]  ?? [phone]   Website: [email or web address]  [email or web address]  "We don't say no, we SHOW how!"         [facility]

## 2022-05-11 DIAGNOSIS — R102 Pelvic and perineal pain: Secondary | ICD-10-CM | POA: Diagnosis not present

## 2022-05-12 ENCOUNTER — Telehealth: Payer: Self-pay

## 2022-05-12 DIAGNOSIS — Z992 Dependence on renal dialysis: Secondary | ICD-10-CM | POA: Diagnosis not present

## 2022-05-12 DIAGNOSIS — N186 End stage renal disease: Secondary | ICD-10-CM | POA: Diagnosis not present

## 2022-05-12 DIAGNOSIS — N2581 Secondary hyperparathyroidism of renal origin: Secondary | ICD-10-CM | POA: Diagnosis not present

## 2022-05-12 DIAGNOSIS — D631 Anemia in chronic kidney disease: Secondary | ICD-10-CM | POA: Diagnosis not present

## 2022-05-12 DIAGNOSIS — E1129 Type 2 diabetes mellitus with other diabetic kidney complication: Secondary | ICD-10-CM | POA: Diagnosis not present

## 2022-05-12 DIAGNOSIS — I159 Secondary hypertension, unspecified: Secondary | ICD-10-CM | POA: Diagnosis not present

## 2022-05-12 NOTE — Telephone Encounter (Signed)
        Patient  visited [facility] on 04/27/2022  for hemorrhoids, lower GI bleed, symptomatic anemia.   Telephone encounter attempt :  3rd  A HIPAA compliant voice message was left requesting a return call.  Instructed patient to call back at [phone]..   [place] Resource Care Guide   ??[name]  ?? [phone]   Website: [email or web address]  [email or web address]  "We don't say no, we SHOW how!"         [facility]

## 2022-05-14 DIAGNOSIS — N186 End stage renal disease: Secondary | ICD-10-CM | POA: Diagnosis not present

## 2022-05-14 DIAGNOSIS — Z992 Dependence on renal dialysis: Secondary | ICD-10-CM | POA: Diagnosis not present

## 2022-05-14 DIAGNOSIS — N040 Nephrotic syndrome with minor glomerular abnormality: Secondary | ICD-10-CM | POA: Diagnosis not present

## 2022-05-15 DIAGNOSIS — Z992 Dependence on renal dialysis: Secondary | ICD-10-CM | POA: Diagnosis not present

## 2022-05-15 DIAGNOSIS — I159 Secondary hypertension, unspecified: Secondary | ICD-10-CM | POA: Diagnosis not present

## 2022-05-15 DIAGNOSIS — Z23 Encounter for immunization: Secondary | ICD-10-CM | POA: Diagnosis not present

## 2022-05-15 DIAGNOSIS — N186 End stage renal disease: Secondary | ICD-10-CM | POA: Diagnosis not present

## 2022-05-15 DIAGNOSIS — D631 Anemia in chronic kidney disease: Secondary | ICD-10-CM | POA: Diagnosis not present

## 2022-05-15 DIAGNOSIS — E119 Type 2 diabetes mellitus without complications: Secondary | ICD-10-CM | POA: Diagnosis not present

## 2022-05-15 DIAGNOSIS — E1129 Type 2 diabetes mellitus with other diabetic kidney complication: Secondary | ICD-10-CM | POA: Diagnosis not present

## 2022-05-15 DIAGNOSIS — N2581 Secondary hyperparathyroidism of renal origin: Secondary | ICD-10-CM | POA: Diagnosis not present

## 2022-05-17 DIAGNOSIS — N186 End stage renal disease: Secondary | ICD-10-CM | POA: Diagnosis not present

## 2022-05-17 DIAGNOSIS — D631 Anemia in chronic kidney disease: Secondary | ICD-10-CM | POA: Diagnosis not present

## 2022-05-17 DIAGNOSIS — Z992 Dependence on renal dialysis: Secondary | ICD-10-CM | POA: Diagnosis not present

## 2022-05-17 DIAGNOSIS — N2581 Secondary hyperparathyroidism of renal origin: Secondary | ICD-10-CM | POA: Diagnosis not present

## 2022-05-17 DIAGNOSIS — E1129 Type 2 diabetes mellitus with other diabetic kidney complication: Secondary | ICD-10-CM | POA: Diagnosis not present

## 2022-05-17 DIAGNOSIS — I159 Secondary hypertension, unspecified: Secondary | ICD-10-CM | POA: Diagnosis not present

## 2022-05-18 ENCOUNTER — Other Ambulatory Visit: Payer: Self-pay | Admitting: Family Medicine

## 2022-05-19 DIAGNOSIS — N2581 Secondary hyperparathyroidism of renal origin: Secondary | ICD-10-CM | POA: Diagnosis not present

## 2022-05-19 DIAGNOSIS — N186 End stage renal disease: Secondary | ICD-10-CM | POA: Diagnosis not present

## 2022-05-19 DIAGNOSIS — Z992 Dependence on renal dialysis: Secondary | ICD-10-CM | POA: Diagnosis not present

## 2022-05-19 DIAGNOSIS — D631 Anemia in chronic kidney disease: Secondary | ICD-10-CM | POA: Diagnosis not present

## 2022-05-19 DIAGNOSIS — I159 Secondary hypertension, unspecified: Secondary | ICD-10-CM | POA: Diagnosis not present

## 2022-05-19 DIAGNOSIS — E1129 Type 2 diabetes mellitus with other diabetic kidney complication: Secondary | ICD-10-CM | POA: Diagnosis not present

## 2022-05-19 NOTE — Telephone Encounter (Signed)
Requested Prescriptions  Pending Prescriptions Disp Refills  . amLODipine (NORVASC) 10 MG tablet [Pharmacy Med Name: AMLODIPINE BESYLATE '10MG'$  TABLETS] 90 tablet 0    Sig: TAKE 1 TABLET BY MOUTH AT BEDTIME     Cardiovascular: Calcium Channel Blockers 2 Failed - 05/18/2022  4:38 PM      Failed - Valid encounter within last 6 months    Recent Outpatient Visits          6 months ago Medication care plan discussed with patient   [place] [name], MD   8 months ago Dyspnea, unspecified type   [place] [name], MD   1 year ago Clostridium difficile colitis   [place] [name], MD   1 year ago Clostridium difficile colitis   [place] [name], MD   1 year ago Clostridium difficile colitis   [place], Cammie Mcgee, MD      Future Appointments            In 1 month Derek Jack, MD [place] at [facility]   In 3 months [place], [name], MD [place] [place] BP in normal range    BP Readings from Last 1 Encounters:  05/01/22 (!) 92/58         Passed - Last Heart Rate in normal range    Pulse Readings from Last 1 Encounters:  05/01/22 98

## 2022-05-22 DIAGNOSIS — N2581 Secondary hyperparathyroidism of renal origin: Secondary | ICD-10-CM | POA: Diagnosis not present

## 2022-05-22 DIAGNOSIS — I159 Secondary hypertension, unspecified: Secondary | ICD-10-CM | POA: Diagnosis not present

## 2022-05-22 DIAGNOSIS — D631 Anemia in chronic kidney disease: Secondary | ICD-10-CM | POA: Diagnosis not present

## 2022-05-22 DIAGNOSIS — N186 End stage renal disease: Secondary | ICD-10-CM | POA: Diagnosis not present

## 2022-05-22 DIAGNOSIS — E1129 Type 2 diabetes mellitus with other diabetic kidney complication: Secondary | ICD-10-CM | POA: Diagnosis not present

## 2022-05-22 DIAGNOSIS — Z992 Dependence on renal dialysis: Secondary | ICD-10-CM | POA: Diagnosis not present

## 2022-05-24 DIAGNOSIS — N186 End stage renal disease: Secondary | ICD-10-CM | POA: Diagnosis not present

## 2022-05-24 DIAGNOSIS — E1129 Type 2 diabetes mellitus with other diabetic kidney complication: Secondary | ICD-10-CM | POA: Diagnosis not present

## 2022-05-24 DIAGNOSIS — I159 Secondary hypertension, unspecified: Secondary | ICD-10-CM | POA: Diagnosis not present

## 2022-05-24 DIAGNOSIS — Z992 Dependence on renal dialysis: Secondary | ICD-10-CM | POA: Diagnosis not present

## 2022-05-24 DIAGNOSIS — N2581 Secondary hyperparathyroidism of renal origin: Secondary | ICD-10-CM | POA: Diagnosis not present

## 2022-05-24 DIAGNOSIS — D631 Anemia in chronic kidney disease: Secondary | ICD-10-CM | POA: Diagnosis not present

## 2022-05-25 ENCOUNTER — Ambulatory Visit: Payer: Medicare Other | Admitting: Gastroenterology

## 2022-05-26 DIAGNOSIS — N2581 Secondary hyperparathyroidism of renal origin: Secondary | ICD-10-CM | POA: Diagnosis not present

## 2022-05-26 DIAGNOSIS — E1129 Type 2 diabetes mellitus with other diabetic kidney complication: Secondary | ICD-10-CM | POA: Diagnosis not present

## 2022-05-26 DIAGNOSIS — I159 Secondary hypertension, unspecified: Secondary | ICD-10-CM | POA: Diagnosis not present

## 2022-05-26 DIAGNOSIS — N186 End stage renal disease: Secondary | ICD-10-CM | POA: Diagnosis not present

## 2022-05-26 DIAGNOSIS — Z992 Dependence on renal dialysis: Secondary | ICD-10-CM | POA: Diagnosis not present

## 2022-05-26 DIAGNOSIS — D631 Anemia in chronic kidney disease: Secondary | ICD-10-CM | POA: Diagnosis not present

## 2022-05-29 DIAGNOSIS — N186 End stage renal disease: Secondary | ICD-10-CM | POA: Diagnosis not present

## 2022-05-29 DIAGNOSIS — D631 Anemia in chronic kidney disease: Secondary | ICD-10-CM | POA: Diagnosis not present

## 2022-05-29 DIAGNOSIS — I159 Secondary hypertension, unspecified: Secondary | ICD-10-CM | POA: Diagnosis not present

## 2022-05-29 DIAGNOSIS — E1129 Type 2 diabetes mellitus with other diabetic kidney complication: Secondary | ICD-10-CM | POA: Diagnosis not present

## 2022-05-29 DIAGNOSIS — Z992 Dependence on renal dialysis: Secondary | ICD-10-CM | POA: Diagnosis not present

## 2022-05-29 DIAGNOSIS — N2581 Secondary hyperparathyroidism of renal origin: Secondary | ICD-10-CM | POA: Diagnosis not present

## 2022-05-31 DIAGNOSIS — E119 Type 2 diabetes mellitus without complications: Secondary | ICD-10-CM | POA: Diagnosis not present

## 2022-05-31 DIAGNOSIS — I159 Secondary hypertension, unspecified: Secondary | ICD-10-CM | POA: Diagnosis not present

## 2022-05-31 DIAGNOSIS — E1129 Type 2 diabetes mellitus with other diabetic kidney complication: Secondary | ICD-10-CM | POA: Diagnosis not present

## 2022-05-31 DIAGNOSIS — N186 End stage renal disease: Secondary | ICD-10-CM | POA: Diagnosis not present

## 2022-05-31 DIAGNOSIS — E039 Hypothyroidism, unspecified: Secondary | ICD-10-CM | POA: Diagnosis not present

## 2022-05-31 DIAGNOSIS — N2581 Secondary hyperparathyroidism of renal origin: Secondary | ICD-10-CM | POA: Diagnosis not present

## 2022-05-31 DIAGNOSIS — Z992 Dependence on renal dialysis: Secondary | ICD-10-CM | POA: Diagnosis not present

## 2022-05-31 DIAGNOSIS — D631 Anemia in chronic kidney disease: Secondary | ICD-10-CM | POA: Diagnosis not present

## 2022-06-02 DIAGNOSIS — N2581 Secondary hyperparathyroidism of renal origin: Secondary | ICD-10-CM | POA: Diagnosis not present

## 2022-06-02 DIAGNOSIS — I159 Secondary hypertension, unspecified: Secondary | ICD-10-CM | POA: Diagnosis not present

## 2022-06-02 DIAGNOSIS — D631 Anemia in chronic kidney disease: Secondary | ICD-10-CM | POA: Diagnosis not present

## 2022-06-02 DIAGNOSIS — E1129 Type 2 diabetes mellitus with other diabetic kidney complication: Secondary | ICD-10-CM | POA: Diagnosis not present

## 2022-06-02 DIAGNOSIS — N186 End stage renal disease: Secondary | ICD-10-CM | POA: Diagnosis not present

## 2022-06-02 DIAGNOSIS — Z992 Dependence on renal dialysis: Secondary | ICD-10-CM | POA: Diagnosis not present

## 2022-06-05 ENCOUNTER — Ambulatory Visit (INDEPENDENT_AMBULATORY_CARE_PROVIDER_SITE_OTHER): Payer: Medicare Other | Admitting: Family Medicine

## 2022-06-05 VITALS — BP 132/76 | HR 78 | Ht 62.0 in | Wt 179.6 lb

## 2022-06-05 DIAGNOSIS — N186 End stage renal disease: Secondary | ICD-10-CM | POA: Diagnosis not present

## 2022-06-05 DIAGNOSIS — E1129 Type 2 diabetes mellitus with other diabetic kidney complication: Secondary | ICD-10-CM | POA: Diagnosis not present

## 2022-06-05 DIAGNOSIS — H8110 Benign paroxysmal vertigo, unspecified ear: Secondary | ICD-10-CM

## 2022-06-05 DIAGNOSIS — D631 Anemia in chronic kidney disease: Secondary | ICD-10-CM | POA: Diagnosis not present

## 2022-06-05 DIAGNOSIS — N2581 Secondary hyperparathyroidism of renal origin: Secondary | ICD-10-CM | POA: Diagnosis not present

## 2022-06-05 DIAGNOSIS — Z992 Dependence on renal dialysis: Secondary | ICD-10-CM | POA: Diagnosis not present

## 2022-06-05 DIAGNOSIS — I159 Secondary hypertension, unspecified: Secondary | ICD-10-CM | POA: Diagnosis not present

## 2022-06-05 NOTE — Progress Notes (Signed)
Subjective:    Patient ID: Megan [name], female    DOB: 1961/06/28, 61 y.o.   MRN: [id number]  Patient reports severe vertigo.  She states that it occurs on a daily basis.  She saw neurology who recommended meclizine.  I originally saw the patient in July and recommended meclizine.  Severity intensifies to contrast following patches.  She saw neurology after that who recommended discontinuation of the patches and switching back to meclizine.  The patient states this occurs on a daily basis.  She states that the room spins constantly.  She denies any exacerbating or alleviating factors.  She denies any hearing loss.  She denies any new headaches.  She denies any neurologic deficits.  She denies any double vision.  She had a normal Past Medical History:  Diagnosis Date   (HFpEF) heart failure with preserved ejection fraction ([place])    a. 01/2019 Echo: EF 55-60%, mild conc LVH. DD.  Torn MV chordae.   Anemia    Atypical chest pain    a. 08/2018 MV: EF 59%, no ischemia; b. 02/2019 Cath: nonobs dzs.   Blood transfusion without reported diagnosis    Breast cancer ([place]) 10/12/2020   Cataract    ESRD (end stage renal disease) on dialysis [facility])    a. HD T, T, S   Essential hypertension, benign    GERD (gastroesophageal reflux disease)    Headache    Hemorrhoids    Mixed hyperlipidemia    Morbid obesity (HCC)    Non-obstructive CAD (coronary artery disease)    a. 02/2019 CathL LM nl, LAD 92m LCX nl, RCA 25p, EF 55-65%.   PONV (postoperative nausea and vomiting)    S/P colonoscopy Jan 2011   Dr. 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 ([facility]    Type 2 diabetes mellitus ([facility]    Wears glasses    Past Surgical History:  Procedure Laterality Date   ABDOMINAL HYSTERECTOMY     APPENDECTOMY     ARTERY BIOPSY N/A 05/09/2018   Procedure: RIGHT TEMPORAL ARTERY BIOPSY;  Surgeon: WJudeth Horn MD;  [facility]  Service: General;   Laterality: N/A;   BREAST BIOPSY Right 06/15/2020   Procedure: RIGHT BREAST BIOPSY;  Surgeon: JAviva Signs MD;  [facility];  Service: General;  Laterality: Right;   CATARACT EXTRACTION W/PHACO Left 02/09/2017   Procedure: CATARACT EXTRACTION PHACO AND INTRAOCULAR LENS PLACEMENT LEFT EYE;  Surgeon: HTonny Branch MD;  Location: [facility];  Service: Ophthalmology;  Laterality: Left;  CDE: 4.89   CATARACT EXTRACTION W/PHACO Right 06/04/2017   Procedure: CATARACT EXTRACTION PHACO AND INTRAOCULAR LENS PLACEMENT (IOC);  Surgeon: HTonny Branch MD;  Location: [facility];  Service: Ophthalmology;  Laterality: Right;  CDE: 4.12   CHOLECYSTECTOMY  09/29/2011   Procedure: LAPAROSCOPIC CHOLECYSTECTOMY;  Surgeon: MJamesetta So MD;  Location: [facility];  Service: General;  Laterality: N/A;   COLONOSCOPY  08/2009   Dr. HBenson [place] sessile polyp (benign lymphoid), large hemorrhoids, repeat 5-10 years   COLONOSCOPY N/A 06/12/2016   prominent hemorrhoids   COLONOSCOPY WITH PROPOFOL N/A 06/16/2021   Procedure: COLONOSCOPY WITH PROPOFOL;  Surgeon: RDaneil Dolin MD;  [facility];  Service: Endoscopy;  Laterality: N/A;  9:30am (dialysis pt)   ESOPHAGOGASTRODUODENOSCOPY  09/05/2011   RINO:MVEHMhiatal hernia; remainder of exam normal. No explanation for patient's abdominal pain with today's examination   ESOPHAGOGASTRODUODENOSCOPY N/A 12/17/2013   Dr. RGala Romney gastric erythema, erosion, mild chronic  inflammation on path    ESOPHAGOGASTRODUODENOSCOPY (EGD) WITH PROPOFOL N/A 06/16/2021   Procedure: ESOPHAGOGASTRODUODENOSCOPY (EGD) WITH PROPOFOL;  Surgeon: Daneil Dolin, MD;  [facility];  Service: Endoscopy;  Laterality: N/A;   EXCISION OF BREAST BIOPSY Right 10/12/2020   Procedure: EXCISION OF RIGHT BREAST BIOPSY;  Surgeon: Aviva Signs, MD;  Location: [facility];  Service: General;  Laterality: Right;   LAPAROSCOPIC APPENDECTOMY  09/29/2011   Procedure: APPENDECTOMY LAPAROSCOPIC;  Surgeon: Jamesetta So, MD;  Location: [facility];  Service: General;;  incidental appendectomy   LEFT HEART CATH AND CORONARY ANGIOGRAPHY N/A 02/28/2019   Procedure: LEFT HEART CATH AND CORONARY ANGIOGRAPHY;  Surgeon: Jettie Booze, MD;  Location: [place] [facility];  Service: Cardiovascular;  Laterality: N/A;   MASTECTOMY MODIFIED RADICAL Right 02/18/2020   Procedure: MASTECTOMY MODIFIED RADICAL;  Surgeon: Aviva Signs, MD;  Location: [facility];  Service: General;  Laterality: Right;   MASTECTOMY, PARTIAL Right 07/13/2020   Procedure: RIGHT PARTIAL MASTECTOMY;  Surgeon: Aviva Signs, MD;  Location: [facility];  Service: General;  Laterality: Right;   PARTIAL MASTECTOMY WITH NEEDLE LOCALIZATION AND AXILLARY SENTINEL LYMPH NODE BX Right 09/18/2018   Procedure: RIGHT PARTIAL MASTECTOMY AFTER NEEDLE LOCALIZATION, SENTINEL LYMPH NODE BIOPSY RIGHT AXILLA;  Surgeon: Aviva Signs, MD;  Location: [facility];  Service: General;  Laterality: Right;   POLYPECTOMY  06/16/2021   Procedure: POLYPECTOMY;  Surgeon: Daneil Dolin, MD;  [facility];  Service: Endoscopy;;   SIMPLE MASTECTOMY WITH AXILLARY SENTINEL NODE BIOPSY Left 06/15/2020   Procedure: LEFT SIMPLE MASTECTOMY;  Surgeon: Aviva Signs, MD;  [facility]: [facility];  Service: General;  Laterality: Left;   Current Outpatient Medications on File Prior to Visit  Medication Sig Dispense Refill   amLODipine (NORVASC) 10 MG tablet TAKE 1 TABLET BY MOUTH AT BEDTIME 90 tablet 0   anastrozole (ARIMIDEX) 1 MG tablet TAKE 1 TABLET(1 MG) BY MOUTH DAILY (Patient taking differently: Take 1 mg by mouth daily.) 30 tablet 4   aspirin EC 81 MG tablet Take 81 mg by mouth daily.     atorvastatin (LIPITOR) 20 MG tablet Take 1 tablet (20 mg total) by mouth daily. TAKE 1 TABLET DAILY 90 tablet 3   B Complex-C-Folic Acid (DIALYVITE 825) 0.8 MG TABS Take 1 tablet by mouth daily.     cinacalcet (SENSIPAR) 30 MG tablet Take 2 tablets (60 mg total) by mouth daily. (Patient taking  differently: Take 30 mg by mouth daily.) 90 tablet 0   Darbepoetin Alfa (ARANESP, ALBUMIN FREE, IJ) Take once a week with dialysis unsure of dose     DULoxetine (CYMBALTA) 30 MG capsule TAKE 1 CAPSULE(30 MG) BY MOUTH DAILY (Patient taking differently: Take 30 mg by mouth daily.) 90 capsule 1   gabapentin (NEURONTIN) 300 MG capsule TAKE 1 CAPSULE(300 MG) BY MOUTH AT BEDTIME (Patient taking differently: Take 300 mg by mouth at bedtime.) 90 capsule 1   HYDROcodone-acetaminophen (NORCO/VICODIN) 5-325 MG tablet Take 1 tablet by mouth every 6 (six) hours as needed. 20 tablet 0   isosorbide mononitrate (IMDUR) 30 MG 24 hr tablet Take 1 tablet (30 mg total) by mouth daily. 90 tablet 3   lanthanum (FOSRENOL) 1000 MG chewable tablet Chew 2,000-3,000 mg by mouth See admin instructions. Take 3 tablets (3000 mg) by mouth with meals and take 2 tablets (2000 mg) with snacks     lidocaine-prilocaine (EMLA) cream Apply 1 application  topically every Monday, Wednesday, and Friday with hemodialysis.  meclizine (ANTIVERT) 25 MG tablet TAKE 1 TABLET(25 MG) BY MOUTH THREE TIMES DAILY AS NEEDED FOR DIZZINESS (Patient taking differently: Take 25 mg by mouth 3 (three) times daily as needed for dizziness or nausea.) 30 tablet 1   nebivolol (BYSTOLIC) 10 MG tablet Take 1 tablet (10 mg total) by mouth in the morning and at bedtime. 180 tablet 3   pantoprazole (PROTONIX) 40 MG tablet TAKE 1 TABLET(40 MG) BY MOUTH DAILY (Patient taking differently: Take 40 mg by mouth daily.) 30 tablet 0   VITAMIN D PO Take by mouth.     No current facility-administered medications on file prior to visit.   No Known Allergies Social History   Socioeconomic History   Marital status: Married    Spouse name: Not on file   Number of children: Not on file   Years of education: Not on file   Highest education level: Not on file  Occupational History   Occupation: Merchandiser, retail: [id number]  Tobacco Use   Smoking status:  Never    Passive exposure: Never   Smokeless tobacco: Never  Vaping Use   Vaping Use: Never used  Substance and Sexual Activity   Alcohol use: No   Drug use: No   Sexual activity: Yes    Birth control/protection: Surgical  Other Topics Concern   Not on file  Social History Narrative   Works at [employer] in [place].    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   Also has to get items from high shelves-causes achy pain in shoulder area      Married.   Children are grown, out of house.   Social Determinants of Health   Financial Resource Strain: Low Risk  (09/09/2021)   Overall Financial Resource Strain (CARDIA)    Difficulty of Paying Living Expenses: Not hard at all  Food Insecurity: No Food Insecurity (09/09/2021)   Hunger Vital Sign    Worried About Running Out of Food in the Last Year: Never true    Ran Out of Food in the Last Year: Never true  Transportation Needs: No Transportation Needs (09/09/2021)   PRAPARE - Hydrologist (Medical): No    Lack of Transportation (Non-Medical): No  Physical Activity: Inactive (09/09/2021)   Exercise Vital Sign    Days of Exercise per Week: 0 days    Minutes of Exercise per Session: 0 min  Stress: No Stress Concern Present (09/09/2021)   [place]    Feeling of Stress : Not at all  Social Connections: [place] (09/09/2021)   Social Connection and Isolation Panel [NHANES]    Frequency of Communication with Friends and Family: More than three times a week    Frequency of Social Gatherings with Friends and Family: More than three times a week    Attends Religious Services: More than 4 times per year    Active Member of [employer] or Organizations: Yes    Attends Archivist Meetings: More than 4 times per year    Marital Status: Married  Human resources officer Violence: Not At Risk (09/09/2021)   Humiliation, Afraid, Rape, and  Kick questionnaire    Fear of Current or Ex-Partner: No    Emotionally Abused: No    Physically Abused: No    Sexually Abused: No    Past Medical History:  Diagnosis Date   (HFpEF) heart failure  with preserved ejection fraction ([place])    a. 01/2019 Echo: EF 55-60%, mild conc LVH. DD.  Torn MV chordae.   Anemia    Atypical chest pain    a. 08/2018 MV: EF 59%, no ischemia; b. 02/2019 Cath: nonobs dzs.   Blood transfusion without reported diagnosis    Breast cancer ([place]) 10/12/2020   Cataract    ESRD (end stage renal disease) on dialysis [facility])    a. HD T, T, S   Essential hypertension, benign    GERD (gastroesophageal reflux disease)    Headache    Hemorrhoids    Mixed hyperlipidemia    Morbid obesity (HCC)    Non-obstructive CAD (coronary artery disease)    a. 02/2019 CathL LM nl, LAD 36m LCX nl, RCA 25p, EF 55-65%.   PONV (postoperative nausea and vomiting)    S/P colonoscopy Jan 2011   Dr. 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 ([facility]    Type 2 diabetes mellitus ([facility]    Wears glasses    Past Surgical History:  Procedure Laterality Date   ABDOMINAL HYSTERECTOMY     APPENDECTOMY     ARTERY BIOPSY N/A 05/09/2018   Procedure: RIGHT TEMPORAL ARTERY BIOPSY;  Surgeon: WJudeth Horn MD;  [facility]  Service: General;  Laterality: N/A;   BREAST BIOPSY Right 06/15/2020   Procedure: RIGHT BREAST BIOPSY;  Surgeon: JAviva Signs MD;  [facility];  Service: General;  Laterality: Right;   CATARACT EXTRACTION W/PHACO Left 02/09/2017   Procedure: CATARACT EXTRACTION PHACO AND INTRAOCULAR LENS PLACEMENT LEFT EYE;  Surgeon: HTonny Branch MD;  [facility];  Service: Ophthalmology;  Laterality: Left;  CDE: 4.89   CATARACT EXTRACTION W/PHACO Right 06/04/2017   Procedure: CATARACT EXTRACTION PHACO AND INTRAOCULAR LENS PLACEMENT (IOC);  Surgeon: HTonny Branch MD;  [facility];  Service: Ophthalmology;  Laterality: Right;   CDE: 4.12   CHOLECYSTECTOMY  09/29/2011   Procedure: LAPAROSCOPIC CHOLECYSTECTOMY;  Surgeon: MJamesetta So MD;  [facility]: [facility];  Service: General;  Laterality: N/A;   COLONOSCOPY  08/2009   Dr. HBenson [place] sessile polyp (benign lymphoid), large hemorrhoids, repeat 5-10 years   COLONOSCOPY N/A 06/12/2016   prominent hemorrhoids   COLONOSCOPY WITH PROPOFOL N/A 06/16/2021   Procedure: COLONOSCOPY WITH PROPOFOL;  Surgeon: RDaneil Dolin MD;  [facility];  Service: Endoscopy;  Laterality: N/A;  9:30am (dialysis pt)   ESOPHAGOGASTRODUODENOSCOPY  09/05/2011   RXBL:TJQZEhiatal hernia; remainder of exam normal. No explanation for patient's abdominal pain with today's examination   ESOPHAGOGASTRODUODENOSCOPY N/A 12/17/2013   Dr. RGala Romney gastric erythema, erosion, mild chronic inflammation on path    ESOPHAGOGASTRODUODENOSCOPY (EGD) WITH PROPOFOL N/A 06/16/2021   Procedure: ESOPHAGOGASTRODUODENOSCOPY (EGD) WITH PROPOFOL;  Surgeon: RDaneil Dolin MD;  [facility];  Service: Endoscopy;  Laterality: N/A;   EXCISION OF BREAST BIOPSY Right 10/12/2020   Procedure: EXCISION OF RIGHT BREAST BIOPSY;  Surgeon: JAviva Signs MD;  [facility];  Service: General;  Laterality: Right;   LAPAROSCOPIC APPENDECTOMY  09/29/2011   Procedure: APPENDECTOMY LAPAROSCOPIC;  Surgeon: MJamesetta So MD;  [facility];  Service: General;;  incidental appendectomy   LEFT HEART CATH AND CORONARY ANGIOGRAPHY N/A 02/28/2019   Procedure: LEFT HEART CATH AND CORONARY ANGIOGRAPHY;  Surgeon: VJettie Booze MD;  [facility];  Service: Cardiovascular;  Laterality: N/A;   MASTECTOMY MODIFIED RADICAL Right 02/18/2020   Procedure: MASTECTOMY MODIFIED RADICAL;  Surgeon: Aviva Signs, MD;  [facility];  Service: General;  Laterality: Right;   MASTECTOMY, PARTIAL Right 07/13/2020   Procedure: RIGHT PARTIAL MASTECTOMY;  Surgeon: Aviva Signs, MD;  [facility];  Service: General;   Laterality: Right;   PARTIAL MASTECTOMY WITH NEEDLE LOCALIZATION AND AXILLARY SENTINEL LYMPH NODE BX Right 09/18/2018   Procedure: RIGHT PARTIAL MASTECTOMY AFTER NEEDLE LOCALIZATION, SENTINEL LYMPH NODE BIOPSY RIGHT AXILLA;  Surgeon: Aviva Signs, MD;  [facility];  Service: General;  Laterality: Right;   POLYPECTOMY  06/16/2021   Procedure: POLYPECTOMY;  Surgeon: Daneil Dolin, MD;  [facility];  Service: Endoscopy;;   SIMPLE MASTECTOMY WITH AXILLARY SENTINEL NODE BIOPSY Left 06/15/2020   Procedure: LEFT SIMPLE MASTECTOMY;  Surgeon: Aviva Signs, MD;  [facility];  Service: General;  Laterality: Left;   Current Outpatient Medications on File Prior to Visit  Medication Sig Dispense Refill   amLODipine (NORVASC) 10 MG tablet TAKE 1 TABLET BY MOUTH AT BEDTIME 90 tablet 0   anastrozole (ARIMIDEX) 1 MG tablet TAKE 1 TABLET(1 MG) BY MOUTH DAILY (Patient taking differently: Take 1 mg by mouth daily.) 30 tablet 4   aspirin EC 81 MG tablet Take 81 mg by mouth daily.     atorvastatin (LIPITOR) 20 MG tablet Take 1 tablet (20 mg total) by mouth daily. TAKE 1 TABLET DAILY 90 tablet 3   B Complex-C-Folic Acid (DIALYVITE 329) 0.8 MG TABS Take 1 tablet by mouth daily.     cinacalcet (SENSIPAR) 30 MG tablet Take 2 tablets (60 mg total) by mouth daily. (Patient taking differently: Take 30 mg by mouth daily.) 90 tablet 0   Darbepoetin Alfa (ARANESP, ALBUMIN FREE, IJ) Take once a week with dialysis unsure of dose     DULoxetine (CYMBALTA) 30 MG capsule TAKE 1 CAPSULE(30 MG) BY MOUTH DAILY (Patient taking differently: Take 30 mg by mouth daily.) 90 capsule 1   gabapentin (NEURONTIN) 300 MG capsule TAKE 1 CAPSULE(300 MG) BY MOUTH AT BEDTIME (Patient taking differently: Take 300 mg by mouth at bedtime.) 90 capsule 1   HYDROcodone-acetaminophen (NORCO/VICODIN) 5-325 MG tablet Take 1 tablet by mouth every 6 (six) hours as needed. 20 tablet 0   isosorbide mononitrate (IMDUR) 30 MG 24 hr tablet  Take 1 tablet (30 mg total) by mouth daily. 90 tablet 3   lanthanum (FOSRENOL) 1000 MG chewable tablet Chew 2,000-3,000 mg by mouth See admin instructions. Take 3 tablets (3000 mg) by mouth with meals and take 2 tablets (2000 mg) with snacks     lidocaine-prilocaine (EMLA) cream Apply 1 application  topically every Monday, Wednesday, and Friday with hemodialysis.     meclizine (ANTIVERT) 25 MG tablet TAKE 1 TABLET(25 MG) BY MOUTH THREE TIMES DAILY AS NEEDED FOR DIZZINESS (Patient taking differently: Take 25 mg by mouth 3 (three) times daily as needed for dizziness or nausea.) 30 tablet 1   nebivolol (BYSTOLIC) 10 MG tablet Take 1 tablet (10 mg total) by mouth in the morning and at bedtime. 180 tablet 3   pantoprazole (PROTONIX) 40 MG tablet TAKE 1 TABLET(40 MG) BY MOUTH DAILY (Patient taking differently: Take 40 mg by mouth daily.) 30 tablet 0   VITAMIN D PO Take by mouth.     No current facility-administered medications on file prior to visit.   No Known Allergies Social History   Socioeconomic History   Marital status: Married    Spouse name: Not on file   Number of children: Not on file  Years of education: Not on file   Highest education level: Not on file  Occupational History   Occupation: Merchandiser, retail: [id number]  Tobacco Use   Smoking status: Never    Passive exposure: Never   Smokeless tobacco: Never  Vaping Use   Vaping Use: Never used  Substance and Sexual Activity   Alcohol use: No   Drug use: No   Sexual activity: Yes    Birth control/protection: Surgical  Other Topics Concern   Not on file  Social History Narrative   Works at [employer] in [place].    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   Also has to get items from high shelves-causes achy pain in shoulder area      Married.   Children are grown, out of house.   Social Determinants of Health   Financial Resource Strain: Low Risk  (09/09/2021)   Overall Financial Resource  Strain (CARDIA)    Difficulty of Paying Living Expenses: Not hard at all  Food Insecurity: No Food Insecurity (09/09/2021)   Hunger Vital Sign    Worried About Running Out of Food in the Last Year: Never true    Ran Out of Food in the Last Year: Never true  Transportation Needs: No Transportation Needs (09/09/2021)   PRAPARE - Hydrologist (Medical): No    Lack of Transportation (Non-Medical): No  Physical Activity: Inactive (09/09/2021)   Exercise Vital Sign    Days of Exercise per Week: 0 days    Minutes of Exercise per Session: 0 min  Stress: No Stress Concern Present (09/09/2021)   [place]    Feeling of Stress : Not at all  Social Connections: [place] (09/09/2021)   Social Connection and Isolation Panel [NHANES]    Frequency of Communication with Friends and Family: More than three times a week    Frequency of Social Gatherings with Friends and Family: More than three times a week    Attends Religious Services: More than 4 times per year    Active Member of [employer] or Organizations: Yes    Attends Archivist Meetings: More than 4 times per year    Marital Status: Married  Human resources officer Violence: Not At Risk (09/09/2021)   Humiliation, Afraid, Rape, and Kick questionnaire    Fear of Current or Ex-Partner: No    Emotionally Abused: No    Physically Abused: No    Sexually Abused: No      Review of Systems  All other systems reviewed and are negative.      Objective:   Physical Exam Vitals reviewed.  Constitutional:      General: She is not in acute distress.    Appearance: Normal appearance. She is normal weight.  Cardiovascular:     Rate and Rhythm: Normal rate and regular rhythm.     Pulses: Normal pulses.     Heart sounds: Murmur (thrill from AV fistual heard as murmur) heard.  Pulmonary:     Effort: Pulmonary effort is normal. No respiratory  distress.     Breath sounds: Normal breath sounds. No stridor. No wheezing, rhonchi or rales.  Chest:     Chest wall: No tenderness.  Musculoskeletal:     Right lower leg: No edema.     Left lower leg: No edema.     Right foot: Normal range of motion.  No deformity.  Feet:     Right foot:     Skin integrity: No ulcer, blister, skin breakdown, erythema, warmth or callus.     Left foot:     Skin integrity: No ulcer, blister, skin breakdown, erythema, warmth or callus.  Neurological:     Mental Status: She is alert.           Assessment & Plan:  Benign paroxysmal positional vertigo, unspecified laterality - Plan: Ambulatory referral to Physical Therapy Spent time with the patient explaining the cause of vertigo and the mechanism of action.  Recommended referral to physical therapy for Epley maneuvers.

## 2022-06-07 DIAGNOSIS — Z992 Dependence on renal dialysis: Secondary | ICD-10-CM | POA: Diagnosis not present

## 2022-06-07 DIAGNOSIS — N186 End stage renal disease: Secondary | ICD-10-CM | POA: Diagnosis not present

## 2022-06-07 DIAGNOSIS — N2581 Secondary hyperparathyroidism of renal origin: Secondary | ICD-10-CM | POA: Diagnosis not present

## 2022-06-07 DIAGNOSIS — D631 Anemia in chronic kidney disease: Secondary | ICD-10-CM | POA: Diagnosis not present

## 2022-06-07 DIAGNOSIS — E1129 Type 2 diabetes mellitus with other diabetic kidney complication: Secondary | ICD-10-CM | POA: Diagnosis not present

## 2022-06-07 DIAGNOSIS — I159 Secondary hypertension, unspecified: Secondary | ICD-10-CM | POA: Diagnosis not present

## 2022-06-09 DIAGNOSIS — E1129 Type 2 diabetes mellitus with other diabetic kidney complication: Secondary | ICD-10-CM | POA: Diagnosis not present

## 2022-06-09 DIAGNOSIS — N186 End stage renal disease: Secondary | ICD-10-CM | POA: Diagnosis not present

## 2022-06-09 DIAGNOSIS — I159 Secondary hypertension, unspecified: Secondary | ICD-10-CM | POA: Diagnosis not present

## 2022-06-09 DIAGNOSIS — N2581 Secondary hyperparathyroidism of renal origin: Secondary | ICD-10-CM | POA: Diagnosis not present

## 2022-06-09 DIAGNOSIS — Z992 Dependence on renal dialysis: Secondary | ICD-10-CM | POA: Diagnosis not present

## 2022-06-09 DIAGNOSIS — D631 Anemia in chronic kidney disease: Secondary | ICD-10-CM | POA: Diagnosis not present

## 2022-06-12 DIAGNOSIS — D631 Anemia in chronic kidney disease: Secondary | ICD-10-CM | POA: Diagnosis not present

## 2022-06-12 DIAGNOSIS — E1129 Type 2 diabetes mellitus with other diabetic kidney complication: Secondary | ICD-10-CM | POA: Diagnosis not present

## 2022-06-12 DIAGNOSIS — N2581 Secondary hyperparathyroidism of renal origin: Secondary | ICD-10-CM | POA: Diagnosis not present

## 2022-06-12 DIAGNOSIS — Z992 Dependence on renal dialysis: Secondary | ICD-10-CM | POA: Diagnosis not present

## 2022-06-12 DIAGNOSIS — I159 Secondary hypertension, unspecified: Secondary | ICD-10-CM | POA: Diagnosis not present

## 2022-06-12 DIAGNOSIS — N186 End stage renal disease: Secondary | ICD-10-CM | POA: Diagnosis not present

## 2022-06-14 DIAGNOSIS — N2581 Secondary hyperparathyroidism of renal origin: Secondary | ICD-10-CM | POA: Diagnosis not present

## 2022-06-14 DIAGNOSIS — D631 Anemia in chronic kidney disease: Secondary | ICD-10-CM | POA: Diagnosis not present

## 2022-06-14 DIAGNOSIS — E119 Type 2 diabetes mellitus without complications: Secondary | ICD-10-CM | POA: Diagnosis not present

## 2022-06-14 DIAGNOSIS — Z992 Dependence on renal dialysis: Secondary | ICD-10-CM | POA: Diagnosis not present

## 2022-06-14 DIAGNOSIS — I159 Secondary hypertension, unspecified: Secondary | ICD-10-CM | POA: Diagnosis not present

## 2022-06-14 DIAGNOSIS — N186 End stage renal disease: Secondary | ICD-10-CM | POA: Diagnosis not present

## 2022-06-14 DIAGNOSIS — N040 Nephrotic syndrome with minor glomerular abnormality: Secondary | ICD-10-CM | POA: Diagnosis not present

## 2022-06-14 DIAGNOSIS — E1129 Type 2 diabetes mellitus with other diabetic kidney complication: Secondary | ICD-10-CM | POA: Diagnosis not present

## 2022-06-14 DIAGNOSIS — D509 Iron deficiency anemia, unspecified: Secondary | ICD-10-CM | POA: Diagnosis not present

## 2022-06-16 DIAGNOSIS — N2581 Secondary hyperparathyroidism of renal origin: Secondary | ICD-10-CM | POA: Diagnosis not present

## 2022-06-16 DIAGNOSIS — I159 Secondary hypertension, unspecified: Secondary | ICD-10-CM | POA: Diagnosis not present

## 2022-06-16 DIAGNOSIS — D631 Anemia in chronic kidney disease: Secondary | ICD-10-CM | POA: Diagnosis not present

## 2022-06-16 DIAGNOSIS — N186 End stage renal disease: Secondary | ICD-10-CM | POA: Diagnosis not present

## 2022-06-16 DIAGNOSIS — Z992 Dependence on renal dialysis: Secondary | ICD-10-CM | POA: Diagnosis not present

## 2022-06-16 DIAGNOSIS — D509 Iron deficiency anemia, unspecified: Secondary | ICD-10-CM | POA: Diagnosis not present

## 2022-06-19 ENCOUNTER — Telehealth: Payer: Self-pay | Admitting: Family Medicine

## 2022-06-19 DIAGNOSIS — N2581 Secondary hyperparathyroidism of renal origin: Secondary | ICD-10-CM | POA: Diagnosis not present

## 2022-06-19 DIAGNOSIS — D509 Iron deficiency anemia, unspecified: Secondary | ICD-10-CM | POA: Diagnosis not present

## 2022-06-19 DIAGNOSIS — D631 Anemia in chronic kidney disease: Secondary | ICD-10-CM | POA: Diagnosis not present

## 2022-06-19 DIAGNOSIS — Z992 Dependence on renal dialysis: Secondary | ICD-10-CM | POA: Diagnosis not present

## 2022-06-19 DIAGNOSIS — N186 End stage renal disease: Secondary | ICD-10-CM | POA: Diagnosis not present

## 2022-06-19 DIAGNOSIS — I159 Secondary hypertension, unspecified: Secondary | ICD-10-CM | POA: Diagnosis not present

## 2022-06-19 NOTE — Telephone Encounter (Signed)
  Prescription Request  06/19/2022  Is this a "Controlled Substance" medicine? Yes  LOV: 06/05/2022   What is the name of the medication or equipment?   HYDROcodone-acetaminophen (NORCO/VICODIN) 5-325 MG tablet   Have you contacted your pharmacy to request a refill? Yes   Which pharmacy would you like this sent to?    [id number] - [place], [address] [place], [place] [address] Phone: [phone]  Fax: [phone] DEA #: [id number]    Patient notified that their request is being sent to the clinical staff for review and that they should receive a response within 2 business days.   Please advise at  [phone]

## 2022-06-19 NOTE — Telephone Encounter (Signed)
Patient called to follow up on ongoing dizziness and headaches; stated she has headaches daily, even on days she doesn't do dialysis.   Patient requesting for provider to order another MRI.  Please advise at [phone].

## 2022-06-20 ENCOUNTER — Other Ambulatory Visit: Payer: Self-pay | Admitting: Family Medicine

## 2022-06-20 MED ORDER — HYDROCODONE-ACETAMINOPHEN 5-325 MG PO TABS: 1.0000 | ORAL_TABLET | Freq: Four times a day (QID) | ORAL | 0 refills | Status: DC | PRN

## 2022-06-21 DIAGNOSIS — N186 End stage renal disease: Secondary | ICD-10-CM | POA: Diagnosis not present

## 2022-06-21 DIAGNOSIS — Z992 Dependence on renal dialysis: Secondary | ICD-10-CM | POA: Diagnosis not present

## 2022-06-21 DIAGNOSIS — I159 Secondary hypertension, unspecified: Secondary | ICD-10-CM | POA: Diagnosis not present

## 2022-06-21 DIAGNOSIS — N2581 Secondary hyperparathyroidism of renal origin: Secondary | ICD-10-CM | POA: Diagnosis not present

## 2022-06-21 DIAGNOSIS — D631 Anemia in chronic kidney disease: Secondary | ICD-10-CM | POA: Diagnosis not present

## 2022-06-21 DIAGNOSIS — D509 Iron deficiency anemia, unspecified: Secondary | ICD-10-CM | POA: Diagnosis not present

## 2022-06-22 ENCOUNTER — Ambulatory Visit: Payer: Medicare Other | Admitting: Cardiology

## 2022-06-23 DIAGNOSIS — Z992 Dependence on renal dialysis: Secondary | ICD-10-CM | POA: Diagnosis not present

## 2022-06-23 DIAGNOSIS — I159 Secondary hypertension, unspecified: Secondary | ICD-10-CM | POA: Diagnosis not present

## 2022-06-23 DIAGNOSIS — D509 Iron deficiency anemia, unspecified: Secondary | ICD-10-CM | POA: Diagnosis not present

## 2022-06-23 DIAGNOSIS — N186 End stage renal disease: Secondary | ICD-10-CM | POA: Diagnosis not present

## 2022-06-23 DIAGNOSIS — D631 Anemia in chronic kidney disease: Secondary | ICD-10-CM | POA: Diagnosis not present

## 2022-06-23 DIAGNOSIS — N2581 Secondary hyperparathyroidism of renal origin: Secondary | ICD-10-CM | POA: Diagnosis not present

## 2022-06-26 DIAGNOSIS — N186 End stage renal disease: Secondary | ICD-10-CM | POA: Diagnosis not present

## 2022-06-26 DIAGNOSIS — D509 Iron deficiency anemia, unspecified: Secondary | ICD-10-CM | POA: Diagnosis not present

## 2022-06-26 DIAGNOSIS — N2581 Secondary hyperparathyroidism of renal origin: Secondary | ICD-10-CM | POA: Diagnosis not present

## 2022-06-26 DIAGNOSIS — Z992 Dependence on renal dialysis: Secondary | ICD-10-CM | POA: Diagnosis not present

## 2022-06-26 DIAGNOSIS — I159 Secondary hypertension, unspecified: Secondary | ICD-10-CM | POA: Diagnosis not present

## 2022-06-26 DIAGNOSIS — D631 Anemia in chronic kidney disease: Secondary | ICD-10-CM | POA: Diagnosis not present

## 2022-06-28 DIAGNOSIS — Z992 Dependence on renal dialysis: Secondary | ICD-10-CM | POA: Diagnosis not present

## 2022-06-28 DIAGNOSIS — D509 Iron deficiency anemia, unspecified: Secondary | ICD-10-CM | POA: Diagnosis not present

## 2022-06-28 DIAGNOSIS — E039 Hypothyroidism, unspecified: Secondary | ICD-10-CM | POA: Diagnosis not present

## 2022-06-28 DIAGNOSIS — N2581 Secondary hyperparathyroidism of renal origin: Secondary | ICD-10-CM | POA: Diagnosis not present

## 2022-06-28 DIAGNOSIS — I159 Secondary hypertension, unspecified: Secondary | ICD-10-CM | POA: Diagnosis not present

## 2022-06-28 DIAGNOSIS — D631 Anemia in chronic kidney disease: Secondary | ICD-10-CM | POA: Diagnosis not present

## 2022-06-28 DIAGNOSIS — N186 End stage renal disease: Secondary | ICD-10-CM | POA: Diagnosis not present

## 2022-06-30 DIAGNOSIS — N186 End stage renal disease: Secondary | ICD-10-CM | POA: Diagnosis not present

## 2022-06-30 DIAGNOSIS — I159 Secondary hypertension, unspecified: Secondary | ICD-10-CM | POA: Diagnosis not present

## 2022-06-30 DIAGNOSIS — D509 Iron deficiency anemia, unspecified: Secondary | ICD-10-CM | POA: Diagnosis not present

## 2022-06-30 DIAGNOSIS — D631 Anemia in chronic kidney disease: Secondary | ICD-10-CM | POA: Diagnosis not present

## 2022-06-30 DIAGNOSIS — N2581 Secondary hyperparathyroidism of renal origin: Secondary | ICD-10-CM | POA: Diagnosis not present

## 2022-06-30 DIAGNOSIS — Z992 Dependence on renal dialysis: Secondary | ICD-10-CM | POA: Diagnosis not present

## 2022-07-02 DIAGNOSIS — I159 Secondary hypertension, unspecified: Secondary | ICD-10-CM | POA: Diagnosis not present

## 2022-07-02 DIAGNOSIS — N186 End stage renal disease: Secondary | ICD-10-CM | POA: Diagnosis not present

## 2022-07-02 DIAGNOSIS — D509 Iron deficiency anemia, unspecified: Secondary | ICD-10-CM | POA: Diagnosis not present

## 2022-07-02 DIAGNOSIS — Z992 Dependence on renal dialysis: Secondary | ICD-10-CM | POA: Diagnosis not present

## 2022-07-02 DIAGNOSIS — D631 Anemia in chronic kidney disease: Secondary | ICD-10-CM | POA: Diagnosis not present

## 2022-07-02 DIAGNOSIS — N2581 Secondary hyperparathyroidism of renal origin: Secondary | ICD-10-CM | POA: Diagnosis not present

## 2022-07-03 NOTE — Therapy (Signed)
OUTPATIENT PHYSICAL THERAPY VESTIBULAR EVALUATION     Patient Name: Megan [name] MRN: [id number] DOB:[birth year], 61 y.o., female Today's Date: 07/04/2022  END OF SESSION:  PT End of Session - 07/04/22 0823     Visit Number 1    Number of Visits 8    Date for PT Re-Evaluation 07/31/22    Authorization Type Medicare Part A    Progress Note Due on Visit 10    PT Start Time 0820    PT Stop Time 0900    PT Time Calculation (min) 40 min    Activity Tolerance Patient tolerated treatment well    Behavior During Therapy [facility] for tasks assessed/performed             Past Medical History:  Diagnosis Date   (HFpEF) heart failure with preserved ejection fraction (HCC)    a. 01/2019 Echo: EF 55-60%, mild conc LVH. DD.  Torn MV chordae.   Anemia    Atypical chest pain    a. 08/2018 MV: EF 59%, no ischemia; b. 02/2019 Cath: nonobs dzs.   Blood transfusion without reported diagnosis    Breast cancer (HCC) 10/12/2020   Cataract    ESRD (end stage renal disease) on dialysis [facility])    a. HD T, T, S   Essential hypertension, benign    GERD (gastroesophageal reflux disease)    Headache    Hemorrhoids    Mixed hyperlipidemia    Morbid obesity (HCC)    Non-obstructive CAD (coronary artery disease)    a. 02/2019 CathL LM nl, LAD 33m, LCX nl, RCA 25p, EF 55-65%.   PONV (postoperative nausea and vomiting)    S/P colonoscopy Jan 2011   Dr. Elnoria Howard: sessile polyp (benign lymphoid), large hemorrhoids, repeat 5-10 years   Temporal arteritis (HCC)    Type 2 diabetes mellitus (HCC)    Wears glasses    Past Surgical History:  Procedure Laterality Date   ABDOMINAL HYSTERECTOMY     APPENDECTOMY     ARTERY BIOPSY N/A 05/09/2018   Procedure: RIGHT TEMPORAL ARTERY BIOPSY;  Surgeon: Jimmye Norman, MD;  Location: [facility] OR;  Service: General;  Laterality: N/A;   BREAST BIOPSY Right 06/15/2020   Procedure: RIGHT BREAST BIOPSY;  Surgeon: Franky Macho, MD;  Location: [facility] ORS;  Service: General;   Laterality: Right;   CATARACT EXTRACTION W/PHACO Left 02/09/2017   Procedure: CATARACT EXTRACTION PHACO AND INTRAOCULAR LENS PLACEMENT LEFT EYE;  Surgeon: Gemma Payor, MD;  Location: [facility] OR[facility];  Service: Ophthalmology;  Laterality: Left;  CDE: 4.89   CATARACT EXTRACTION W/PHACO Right 06/04/2017   Procedure: CATARACT EXTRACTION PHACO AND INTRAOCULAR LENS PLACEMENT (IOC);  Surgeon: Gemma Payor, MD;  Location: [facility] ORS;  Service: Ophthalmology;  Laterality: Right;  CDE: 4.12   CHOLECYSTECTOMY  09/29/2011   Procedure: LAPAROSCOPIC CHOLECYSTECTOMY;  Surgeon: Dalia Heading, MD;  Location: [facility] ORS;  Service: General;  Laterality: N/A;   COLONOSCOPY  08/2009   Dr. Elnoria Howard: sessile polyp (benign lymphoid), large hemorrhoids, repeat 5-10 years   COLONOSCOPY N/A 06/12/2016   prominent hemorrhoids   COLONOSCOPY WITH PROPOFOL N/A 06/16/2021   Procedure: COLONOSCOPY WITH PROPOFOL;  Surgeon: Corbin Ade, MD;  Location: [facility];  Service: Endoscopy;  Laterality: N/A;  9:30am (dialysis pt)   ESOPHAGOGASTRODUODENOSCOPY  09/05/2011   WUJ:[id number] hiatal hernia; remainder of exam normal. No explanation for patient's abdominal pain with today's examination   ESOPHAGOGASTRODUODENOSCOPY N/A 12/17/2013   Dr. Jena Gauss: gastric erythema, erosion, mild chronic inflammation on path  ESOPHAGOGASTRODUODENOSCOPY (EGD) WITH PROPOFOL N/A 06/16/2021   Procedure: ESOPHAGOGASTRODUODENOSCOPY (EGD) WITH PROPOFOL;  Surgeon: Corbin Ade, MD;  Location: [facility];  Service: Endoscopy;  Laterality: N/A;   EXCISION OF BREAST BIOPSY Right 10/12/2020   Procedure: EXCISION OF RIGHT BREAST BIOPSY;  Surgeon: Franky Macho, MD;  Location: [facility];  Service: General;  Laterality: Right;   LAPAROSCOPIC APPENDECTOMY  09/29/2011   Procedure: APPENDECTOMY LAPAROSCOPIC;  Surgeon: Dalia Heading, MD;  Location: [facility] OR[facility];  Service: General;;  incidental appendectomy   LEFT HEART CATH AND CORONARY ANGIOGRAPHY N/A 02/28/2019   Procedure: LEFT HEART  CATH AND CORONARY ANGIOGRAPHY;  Surgeon: Corky Crafts, MD;  Location: [facility] INVASIVE CV LAB;  Service: Cardiovascular;  Laterality: N/A;   MASTECTOMY MODIFIED RADICAL Right 02/18/2020   Procedure: MASTECTOMY MODIFIED RADICAL;  Surgeon: Franky Macho, MD;  Location: [facility];  Service: General;  Laterality: Right;   MASTECTOMY, PARTIAL Right 07/13/2020   Procedure: RIGHT PARTIAL MASTECTOMY;  Surgeon: Franky Macho, MD;  Location: [facility];  Service: General;  Laterality: Right;   PARTIAL MASTECTOMY WITH NEEDLE LOCALIZATION AND AXILLARY SENTINEL LYMPH NODE BX Right 09/18/2018   Procedure: RIGHT PARTIAL MASTECTOMY AFTER NEEDLE LOCALIZATION, SENTINEL LYMPH NODE BIOPSY RIGHT AXILLA;  Surgeon: Franky Macho, MD;  Location: [facility];  Service: General;  Laterality: Right;   POLYPECTOMY  06/16/2021   Procedure: POLYPECTOMY;  Surgeon: Corbin Ade, MD;  Location: [facility];  Service: Endoscopy;;   SIMPLE MASTECTOMY WITH AXILLARY SENTINEL NODE BIOPSY Left 06/15/2020   Procedure: LEFT SIMPLE MASTECTOMY;  Surgeon: Franky Macho, MD;  Location: [facility];  Service: General;  Laterality: Left;   Patient Active Problem List   Diagnosis Date Noted   Dysuria 04/05/2022   Prolapsed internal hemorrhoids, grade 2 12/07/2021   LUQ pain 05/11/2021   Fatigue 05/03/2021   C. difficile colitis 02/15/2021   Dizziness    Breast mass, right    History of right breast cancer    Lobar pneumonia, unspecified organism (HCC) 07/03/2020   Pleural effusion, right    S/P left mastectomy 06/15/2020   Allergy, unspecified, initial encounter 03/03/2020   Anaphylactic shock, unspecified, initial encounter 03/03/2020   Breast cancer (HCC) 02/18/2020   History of modified radical mastectomy of right breast 02/18/2020   S/P mastectomy, right 02/18/2020   ESRD (end stage renal disease) on dialysis (HCC)    Grade III hemorrhoids 07/31/2019   Fluid overload, unspecified 05/24/2019   Anaphylactic reaction due to adverse effect  of correct drug or medicament properly administered, initial encounter 04/30/2019   Unspecified protein-calorie malnutrition (HCC) 03/03/2019   Hypertensive heart disease with heart failure (HCC) 03/01/2019   Atherosclerotic heart disease of native coronary artery without angina pectoris 03/01/2019   Chest pain 02/28/2019   Chest pain with high risk for cardiac etiology 02/28/2019   Elevated troponin 02/03/2019   ESRD (end stage renal disease) (HCC) 02/03/2019   Acute diastolic CHF (congestive heart failure) (HCC) 02/03/2019   Hypokalemia 02/03/2019   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)    Hypocalcemia 09/03/2018   Hypothyroidism, unspecified 09/21/2017   Anemia in chronic kidney disease 04/12/2017   Iron deficiency anemia, unspecified 04/12/2017   Abnormal results of kidney function studies 04/11/2017   Acidosis 04/11/2017   Anemia, unspecified 04/11/2017   Coagulation defect, unspecified (HCC) 04/11/2017   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, with stage 5 chronic kidney disease, or end stage renal disease (HCC) 04/11/2017   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 04/11/2017   Other disorders of phosphorus metabolism 04/11/2017   Pain, unspecified 04/11/2017   Proteinuria, unspecified 04/11/2017   Pruritus, unspecified 04/11/2017   Unspecified kidney failure 04/11/2017   Encounter for prophylactic removal of breast 04/04/2017   Rectal bleeding 06/08/2016   Vaginal discharge 12/30/2015   Abdominal pain, epigastric 11/26/2013   GERD (gastroesophageal reflux disease)    Type 2 diabetes mellitus (HCC)    CKD (chronic kidney disease) stage 3, GFR 30-59 ml/min (HCC)    External hemorrhoid 08/01/2011   Hematochezia 07/13/2011   Dyspnea 04/21/2010   Atypical chest pain 04/21/2010   Mixed hyperlipidemia 03/11/2008   OBESITY 03/11/2008   Essential hypertension, benign 03/11/2008    PCP:  Donita Brooks, MD REFERRING PROVIDER: Donita Brooks, MD  REFERRING DIAG: H81.10 (ICD-10-CM) - Benign paroxysmal positional vertigo, unspecified laterality  THERAPY DIAG:  Dizziness and giddiness  ONSET DATE: summer of this year  Rationale for Evaluation and Treatment: Rehabilitation  SUBJECTIVE:   SUBJECTIVE STATEMENT: Patient reports insidious onset of dizziness and vertigo; sometimes doing nothing at all and it comes on.  Sitting in the car or walking in the hallway or laying in the bed; husband holds her hand most of the time walking. Pt accompanied by: significant other  PERTINENT HISTORY: dialysis MWF; 6 years Hx of breast cancer Wants a kidney transplant  PAIN:  Are you having pain? Yes: NPRS scale: 8/10 Pain location: left arm Pain description: sore Aggravating factors: dialysis Relieving factors: rest  PRECAUTIONS: None  WEIGHT BEARING RESTRICTIONS: No  FALLS: Has patient fallen in last 6 months? No  LIVING ENVIRONMENT: Lives with: lives with their spouse Lives in: House/apartment Stairs: Yes: External: 1 steps; none Has following equipment at home: None  PLOF: Needs assistance with ADLs, Needs assistance with homemaking, and Needs assistance with gait  PATIENT GOALS: stop this dizziness  OBJECTIVE:   DIAGNOSTIC FINDINGS: none  COGNITION: Overall cognitive status: Within functional limits for tasks assessed   SENSATION: Some numbness in hands occassionally  EDEMA: mild swelling left arm    POSTURE:  rounded shoulders and forward head  Cervical ROM:  grossly wfl; no pain complaint  Active A/PROM (deg) eval  Flexion   Extension   Right lateral flexion   Left lateral flexion   Right rotation   Left rotation   (Blank rows = not tested)  STRENGTH: grossly wfl bilateral upper extremity  LOWER EXTREMITY MMT:   MMT Right eval Left eval  Hip flexion    Hip abduction    Hip adduction    Hip internal rotation    Hip external  rotation    Knee flexion    Knee extension    Ankle dorsiflexion    Ankle plantarflexion    Ankle inversion    Ankle eversion    (Blank rows = not tested)  BED MOBILITY:  Sit to supine SBA  TRANSFERS: Assistive device utilized: None  Sit to stand: Modified independence Stand to sit: Modified independence Chair to chair: Modified independence    GAIT: Gait pattern:  holds her husbands hand, decreased arm swing- Right, decreased arm swing- Left, decreased step length- Right, and decreased step length- Left Distance walked: 75 ft Assistive device utilized: None Level of assistance: CGA Comments: holds her husbands hand  FUNCTIONAL TESTS:  5 times sit to stand: 19.95 sec  PATIENT SURVEYS:  FOTO 37  VESTIBULAR ASSESSMENT:  GENERAL OBSERVATION: glasses   SYMPTOM BEHAVIOR:  Subjective history:  dizziness spontaneous  Non-Vestibular symptoms:  none  Type of dizziness: Lightheadedness/Faint  Frequency: daily, multiple times  Duration: varies  Aggravating factors: Moving eyes  Relieving factors: no known relieving factors  Progression of symptoms: unchanged  OCULOMOTOR EXAM:  Ocular Alignment: normal  Ocular ROM: No Limitations  Spontaneous Nystagmus: absent  Gaze-Induced Nystagmus: absent but causes her to feel faint  Smooth Pursuits: intact  Saccades: intact  Convergence/Divergence:  cm    VESTIBULAR - OCULAR REFLEX:   Slow VOR: Normal  VOR Cancellation: Normal  Head-Impulse Test: HIT Right: negative HIT Left: negative  Dynamic Visual Acuity:  not assessed   POSITIONAL TESTING: Right Dix-Hallpike: no nystagmus Left Dix-Hallpike: no nystagmus  MOTION SENSITIVITY:  Motion Sensitivity Quotient Intensity: 0 = none, 1 = Lightheaded, 2 = Mild, 3 = Moderate, 4 = Severe, 5 = Vomiting  Intensity  1. Sitting to supine 0  2. Supine to L side   3. Supine to R side   4. Supine to sitting 2  5. L Hallpike-Dix 0  6. Up from L  2  7. R Hallpike-Dix 0  8. Up from R   2  9. Sitting, head tipped to L knee   10. Head up from L knee   11. Sitting, head tipped to R knee   12. Head up from R knee   13. Sitting head turns x5   14.Sitting head nods x5   15. In stance, 180 turn to L    16. In stance, 180 turn to R     OTHOSTATICS: not done  FUNCTIONAL GAIT: 5 times sit to stand: 19.95 sec   VESTIBULAR TREATMENT:                                                                                                   DATE:   07/04/22 Physical therapy evaluation and HEP instruction   Gaze Adaptation:  x1 Viewing Horizontal: Position: sitting and Reps: 10-20 and x1 Viewing Vertical:  Position: sitting and Reps: 10-20 Education details: Patient educated on exam findings, POC, scope of PT, HE. Person educated: Patient Education method: Explanation, Demonstration, and Handouts Education comprehension: verbalized understanding, returned demonstration, verbal cues required, and tactile cues required  HOME EXERCISE PROGRAM: Access Code: [facility] URL: [email or web address] Date: 07/04/2022 Prepared by: [facility] - Rehab  Exercises - Seated Gaze Stabilization with Head Rotation  - 3-5 x daily - 7 x weekly - 2 sets - 10 reps - Seated Gaze Stabilization with Head Nod  - 3-5 x daily - 7 x weekly - 2 sets - 10 reps GOALS: Goals reviewed with patient? No  SHORT TERM GOALS: Target date: 07/18/2022  Patient will be independent with initial HEP  Baseline: Goal status: INITIAL  2.  Patient will report at least 50% improvement in overall symptoms and/or function to demonstrate improved functional mobility  Baseline:  Goal status: INITIAL  LONG TERM GOALS: Target date: 08/01/2022  atient will be independent in self management strategies to improve quality of life and functional outcomes.  Baseline:  Goal status: INITIAL  2.  Patient will be able to  walk in the grocery store to shop without having to hold her husbands hand with minimal  dizziness Baseline:  Goal status: INITIAL  3.  Patient will improve FOTO score to predicted value   Baseline: 37 Goal status: INITIAL  4.  Patient will improve DGI score  Baseline: perform next visit Goal status: INITIAL   ASSESSMENT:  CLINICAL IMPRESSION: Patient is a 61 y.o. female who was seen today for physical therapy evaluation and treatment for compliants of dizziness.  Patient demonstrates increased vestibular symptoms with provocative testing which is negatively impacting patient ability to perform ADLs and functional mobility tasks. Of note, no nystagmus noted with positional testing but complaints of dizziness (light headedness) with ocular movement.  Patient will benefit from skilled physical therapy services to address these deficits to improve level of function with ADLs, functional mobility tasks, and reduce risk for falls.    OBJECTIVE IMPAIRMENTS: decreased activity tolerance, decreased balance, decreased endurance, decreased knowledge of condition, decreased mobility, difficulty walking, dizziness, impaired perceived functional ability, and pain.   ACTIVITY LIMITATIONS: carrying, lifting, bending, sitting, standing, squatting, sleeping, stairs, transfers, bed mobility, dressing, hygiene/grooming, locomotion level, and caring for others  PARTICIPATION LIMITATIONS: meal prep, cleaning, laundry, driving, shopping, community activity, and church  PREHAB POTENTIAL: Good  CLINICAL DECISION MAKING: Evolving/moderate complexity  EVALUATION COMPLEXITY: Moderate   PLAN:  PT FREQUENCY: 2x/week  PT DURATION: 4 weeks  Therapeutic exercises, Therapeutic activity, Neuromuscular re-education, Balance training, Gait training, Patient/Family education, Joint manipulation, Joint mobilization, Stair training, Orthotic/Fit training, DME instructions, Aquatic Therapy, Dry Needling, Electrical stimulation, Spinal manipulation, Spinal mobilization, Cryotherapy, Moist heat,  Compression bandaging, scar mobilization, Splintting, Taping, Traction, Ultrasound, Ionotophoresis 4mg /ml Dexamethasone, and Manual therapy. Vestibular habituation, vestibular rehab techniques  PLAN FOR NEXT SESSION: Review goals and HEP; review reaction to HEP; DGI   11:13 AM, 07/04/22 Ola Fawver Small Monaye Blackie MPT  physical therapy [place] [phone] Ph:[phone]

## 2022-07-04 ENCOUNTER — Ambulatory Visit (HOSPITAL_COMMUNITY): Payer: Medicare Other | Attending: Family Medicine

## 2022-07-04 ENCOUNTER — Inpatient Hospital Stay: Payer: Medicare Other | Attending: Hematology

## 2022-07-04 DIAGNOSIS — Z170 Estrogen receptor positive status [ER+]: Secondary | ICD-10-CM | POA: Insufficient documentation

## 2022-07-04 DIAGNOSIS — H8110 Benign paroxysmal vertigo, unspecified ear: Secondary | ICD-10-CM | POA: Insufficient documentation

## 2022-07-04 DIAGNOSIS — D509 Iron deficiency anemia, unspecified: Secondary | ICD-10-CM | POA: Diagnosis not present

## 2022-07-04 DIAGNOSIS — Z992 Dependence on renal dialysis: Secondary | ICD-10-CM | POA: Diagnosis not present

## 2022-07-04 DIAGNOSIS — C50211 Malignant neoplasm of upper-inner quadrant of right female breast: Secondary | ICD-10-CM | POA: Diagnosis not present

## 2022-07-04 DIAGNOSIS — E559 Vitamin D deficiency, unspecified: Secondary | ICD-10-CM | POA: Diagnosis not present

## 2022-07-04 DIAGNOSIS — I159 Secondary hypertension, unspecified: Secondary | ICD-10-CM | POA: Diagnosis not present

## 2022-07-04 DIAGNOSIS — D631 Anemia in chronic kidney disease: Secondary | ICD-10-CM | POA: Diagnosis not present

## 2022-07-04 DIAGNOSIS — R42 Dizziness and giddiness: Secondary | ICD-10-CM

## 2022-07-04 DIAGNOSIS — N186 End stage renal disease: Secondary | ICD-10-CM | POA: Diagnosis not present

## 2022-07-04 DIAGNOSIS — N2581 Secondary hyperparathyroidism of renal origin: Secondary | ICD-10-CM | POA: Diagnosis not present

## 2022-07-04 LAB — CBC WITH DIFFERENTIAL/PLATELET
Abs Immature Granulocytes: 0.04 10*3/uL (ref 0.00–0.07)
Basophils Absolute: 0.1 10*3/uL (ref 0.0–0.1)
Basophils Relative: 1 %
Eosinophils Absolute: 0.3 10*3/uL (ref 0.0–0.5)
Eosinophils Relative: 4 %
HCT: 36.4 % (ref 36.0–46.0)
Hemoglobin: 11.9 g/dL — ABNORMAL LOW (ref 12.0–15.0)
Immature Granulocytes: 1 %
Lymphocytes Relative: 18 %
Lymphs Abs: 1.5 10*3/uL (ref 0.7–4.0)
MCH: 34.8 pg — ABNORMAL HIGH (ref 26.0–34.0)
MCHC: 32.7 g/dL (ref 30.0–36.0)
MCV: 106.4 fL — ABNORMAL HIGH (ref 80.0–100.0)
Monocytes Absolute: 0.7 10*3/uL (ref 0.1–1.0)
Monocytes Relative: 8 %
Neutro Abs: 5.8 10*3/uL (ref 1.7–7.7)
Neutrophils Relative %: 68 %
Platelets: 273 10*3/uL (ref 150–400)
RBC: 3.42 MIL/uL — ABNORMAL LOW (ref 3.87–5.11)
RDW: 15.3 % (ref 11.5–15.5)
WBC: 8.4 10*3/uL (ref 4.0–10.5)
nRBC: 0 % (ref 0.0–0.2)

## 2022-07-04 LAB — COMPREHENSIVE METABOLIC PANEL
ALT: 22 U/L (ref 0–44)
AST: 19 U/L (ref 15–41)
Albumin: 4 g/dL (ref 3.5–5.0)
Alkaline Phosphatase: 94 U/L (ref 38–126)
Anion gap: 11 (ref 5–15)
BUN: 27 mg/dL — ABNORMAL HIGH (ref 6–20)
CO2: 31 mmol/L (ref 22–32)
Calcium: 9.7 mg/dL (ref 8.9–10.3)
Chloride: 97 mmol/L — ABNORMAL LOW (ref 98–111)
Creatinine, Ser: 6.86 mg/dL — ABNORMAL HIGH (ref 0.44–1.00)
GFR, Estimated: 6 mL/min — ABNORMAL LOW (ref >60)
Glucose, Bld: 130 mg/dL — ABNORMAL HIGH (ref 70–99)
Potassium: 3.4 mmol/L — ABNORMAL LOW (ref 3.5–5.1)
Sodium: 139 mmol/L (ref 135–145)
Total Bilirubin: 0.8 mg/dL (ref 0.3–1.2)
Total Protein: 8.3 g/dL — ABNORMAL HIGH (ref 6.5–8.1)

## 2022-07-04 LAB — VITAMIN D 25 HYDROXY (VIT D DEFICIENCY, FRACTURES): Vit D, 25-Hydroxy: 19.34 ng/mL — ABNORMAL LOW (ref 30–100)

## 2022-07-05 LAB — CANCER ANTIGEN 15-3: CA 15-3: 29.5 U/mL — ABNORMAL HIGH (ref 0.0–25.0)

## 2022-07-07 DIAGNOSIS — D631 Anemia in chronic kidney disease: Secondary | ICD-10-CM | POA: Diagnosis not present

## 2022-07-07 DIAGNOSIS — D509 Iron deficiency anemia, unspecified: Secondary | ICD-10-CM | POA: Diagnosis not present

## 2022-07-07 DIAGNOSIS — Z992 Dependence on renal dialysis: Secondary | ICD-10-CM | POA: Diagnosis not present

## 2022-07-07 DIAGNOSIS — I159 Secondary hypertension, unspecified: Secondary | ICD-10-CM | POA: Diagnosis not present

## 2022-07-07 DIAGNOSIS — N186 End stage renal disease: Secondary | ICD-10-CM | POA: Diagnosis not present

## 2022-07-07 DIAGNOSIS — N2581 Secondary hyperparathyroidism of renal origin: Secondary | ICD-10-CM | POA: Diagnosis not present

## 2022-07-10 DIAGNOSIS — N2581 Secondary hyperparathyroidism of renal origin: Secondary | ICD-10-CM | POA: Diagnosis not present

## 2022-07-10 DIAGNOSIS — Z992 Dependence on renal dialysis: Secondary | ICD-10-CM | POA: Diagnosis not present

## 2022-07-10 DIAGNOSIS — I159 Secondary hypertension, unspecified: Secondary | ICD-10-CM | POA: Diagnosis not present

## 2022-07-10 DIAGNOSIS — D509 Iron deficiency anemia, unspecified: Secondary | ICD-10-CM | POA: Diagnosis not present

## 2022-07-10 DIAGNOSIS — D631 Anemia in chronic kidney disease: Secondary | ICD-10-CM | POA: Diagnosis not present

## 2022-07-10 DIAGNOSIS — N186 End stage renal disease: Secondary | ICD-10-CM | POA: Diagnosis not present

## 2022-07-11 ENCOUNTER — Ambulatory Visit (INDEPENDENT_AMBULATORY_CARE_PROVIDER_SITE_OTHER): Payer: Medicare Other | Admitting: Gastroenterology

## 2022-07-11 ENCOUNTER — Encounter: Payer: Self-pay | Admitting: Gastroenterology

## 2022-07-11 VITALS — BP 138/75 | HR 74 | Temp 98.3°F | Ht 61.0 in | Wt 183.0 lb

## 2022-07-11 DIAGNOSIS — K625 Hemorrhage of anus and rectum: Secondary | ICD-10-CM | POA: Diagnosis not present

## 2022-07-11 DIAGNOSIS — K219 Gastro-esophageal reflux disease without esophagitis: Secondary | ICD-10-CM | POA: Diagnosis not present

## 2022-07-11 NOTE — Progress Notes (Signed)
Gastroenterology Office Note     Primary Care Physician:  [name], MD  Primary Gastroenterologist: Dr. [name]    Chief Complaint   Chief Complaint  Patient presents with   Follow-up    Follow up on bleeding. Had some last week for 2 days     History of Present Illness   Megan [name] is a 61 y.o. female presenting today in follow-up with a history of  internal hemorrhoids s/p banding in 2021 and recently in April/May 2023, [place] in July 2022, anemia, HFpEF, breast cancer, GERD, HTN, ESRD on dialysis, CAD, HLD, and diabetes presenting today in follow-up.   Pantoprazole once per day. Controls GERD. Occasional constipation. But not significant. Occasional rectal bleeding and prolapse. No straining. Limits toilet time. No pain. Wants to hold off on further banding. Interested in hemorrhoid surgery if continues.   Colonoscopy up-to-date as of 2022. Next in 10 years.    Past Medical History:  Diagnosis Date   (HFpEF) heart failure with preserved ejection fraction ([place])    a. 01/2019 Echo: EF 55-60%, mild conc LVH. DD.  Torn MV chordae.   Anemia    Atypical chest pain    a. 08/2018 MV: EF 59%, no ischemia; b. 02/2019 Cath: nonobs dzs.   Blood transfusion without reported diagnosis    Breast cancer ([place]) 10/12/2020   Cataract    ESRD (end stage renal disease) on dialysis [facility])    a. HD T, T, S   Essential hypertension, benign    GERD (gastroesophageal reflux disease)    Headache    Hemorrhoids    Mixed hyperlipidemia    Morbid obesity (HCC)    Non-obstructive CAD (coronary artery disease)    a. 02/2019 CathL LM nl, LAD 63m LCX nl, RCA 25p, EF 55-65%.   PONV (postoperative nausea and vomiting)    S/P colonoscopy Jan 2011   Dr. 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 ([place]    Type 2 diabetes mellitus ([place]    Wears glasses     Past Surgical History:  Procedure Laterality Date   ABDOMINAL  HYSTERECTOMY     APPENDECTOMY     ARTERY BIOPSY N/A 05/09/2018   Procedure: RIGHT TEMPORAL ARTERY BIOPSY;  Surgeon: WJudeth Horn MD;  [place]  Service: General;  Laterality: N/A;   BREAST BIOPSY Right 06/15/2020   Procedure: RIGHT BREAST BIOPSY;  Surgeon: JAviva Signs MD;  Location: [facility];  Service: General;  Laterality: Right;   CATARACT EXTRACTION W/PHACO Left 02/09/2017   Procedure: CATARACT EXTRACTION PHACO AND INTRAOCULAR LENS PLACEMENT LEFT EYE;  Surgeon: HTonny Branch MD;  Location: [facility];  Service: Ophthalmology;  Laterality: Left;  CDE: 4.89   CATARACT EXTRACTION W/PHACO Right 06/04/2017   Procedure: CATARACT EXTRACTION PHACO AND INTRAOCULAR LENS PLACEMENT (IOC);  Surgeon: HTonny Branch MD;  Location: [facility];  Service: Ophthalmology;  Laterality: Right;  CDE: 4.12   CHOLECYSTECTOMY  09/29/2011   Procedure: LAPAROSCOPIC CHOLECYSTECTOMY;  Surgeon: MJamesetta So MD;  Location: [facility];  Service: General;  Laterality: N/A;   COLONOSCOPY  08/2009   Dr. HBenson Norway sessile polyp (benign lymphoid), large hemorrhoids, repeat 5-10 years   COLONOSCOPY N/A 06/12/2016   prominent hemorrhoids   COLONOSCOPY WITH PROPOFOL N/A 06/16/2021   Procedure: COLONOSCOPY WITH PROPOFOL;  Surgeon: RDaneil Dolin MD;  [facility];  Service: Endoscopy;  Laterality: N/A;  9:30am (dialysis pt)   ESOPHAGOGASTRODUODENOSCOPY  09/05/2011  [id number] hiatal hernia; remainder of exam normal. No explanation for patient's abdominal pain with today's examination   ESOPHAGOGASTRODUODENOSCOPY N/A 12/17/2013   Dr. [name]: gastric erythema, erosion, mild chronic inflammation on path    ESOPHAGOGASTRODUODENOSCOPY (EGD) WITH PROPOFOL N/A 06/16/2021   Procedure: ESOPHAGOGASTRODUODENOSCOPY (EGD) WITH PROPOFOL;  Surgeon: Daneil Dolin, MD;  Location: [facility];  Service: Endoscopy;  Laterality: N/A;   EXCISION OF BREAST BIOPSY Right 10/12/2020   Procedure: EXCISION OF RIGHT BREAST BIOPSY;  Surgeon: Aviva Signs, MD;  Location: [facility];  Service: General;  Laterality: Right;   LAPAROSCOPIC APPENDECTOMY  09/29/2011   Procedure: APPENDECTOMY LAPAROSCOPIC;  Surgeon: Jamesetta So, MD;  Location: [facility];  Service: General;;  incidental appendectomy   LEFT HEART CATH AND CORONARY ANGIOGRAPHY N/A 02/28/2019   Procedure: LEFT HEART CATH AND CORONARY ANGIOGRAPHY;  Surgeon: Jettie Booze, MD;  Location: [place] [facility];  Service: Cardiovascular;  Laterality: N/A;   MASTECTOMY MODIFIED RADICAL Right 02/18/2020   Procedure: MASTECTOMY MODIFIED RADICAL;  Surgeon: Aviva Signs, MD;  Location: [facility];  Service: General;  Laterality: Right;   MASTECTOMY, PARTIAL Right 07/13/2020   Procedure: RIGHT PARTIAL MASTECTOMY;  Surgeon: Aviva Signs, MD;  Location: [facility];  Service: General;  Laterality: Right;   PARTIAL MASTECTOMY WITH NEEDLE LOCALIZATION AND AXILLARY SENTINEL LYMPH NODE BX Right 09/18/2018   Procedure: RIGHT PARTIAL MASTECTOMY AFTER NEEDLE LOCALIZATION, SENTINEL LYMPH NODE BIOPSY RIGHT AXILLA;  Surgeon: Aviva Signs, MD;  Location: [facility];  Service: General;  Laterality: Right;   POLYPECTOMY  06/16/2021   Procedure: POLYPECTOMY;  Surgeon: Daneil Dolin, MD;  Location[facility];  Service: Endoscopy;;   SIMPLE MASTECTOMY WITH AXILLARY SENTINEL NODE BIOPSY Left 06/15/2020   Procedure: LEFT SIMPLE MASTECTOMY;  Surgeon: Aviva Signs, MD;  Location: [facility];  Service: General;  Laterality: Left;    Current Outpatient Medications  Medication Sig Dispense Refill   amLODipine (NORVASC) 10 MG tablet TAKE 1 TABLET BY MOUTH AT BEDTIME 90 tablet 0   anastrozole (ARIMIDEX) 1 MG tablet TAKE 1 TABLET(1 MG) BY MOUTH DAILY (Patient taking differently: Take 1 mg by mouth daily.) 30 tablet 4   aspirin EC 81 MG tablet Take 81 mg by mouth daily.     atorvastatin (LIPITOR) 20 MG tablet Take 1 tablet (20 mg total) by mouth daily. TAKE 1 TABLET DAILY 90 tablet 3   cinacalcet (SENSIPAR) 30 MG tablet Take 2  tablets (60 mg total) by mouth daily. (Patient taking differently: Take 30 mg by mouth daily.) 90 tablet 0   DULoxetine (CYMBALTA) 30 MG capsule TAKE 1 CAPSULE(30 MG) BY MOUTH DAILY (Patient taking differently: Take 30 mg by mouth daily.) 90 capsule 1   gabapentin (NEURONTIN) 300 MG capsule TAKE 1 CAPSULE(300 MG) BY MOUTH AT BEDTIME (Patient taking differently: Take 300 mg by mouth at bedtime.) 90 capsule 1   HYDROcodone-acetaminophen (NORCO/VICODIN) 5-325 MG tablet Take 1 tablet by mouth every 6 (six) hours as needed. 20 tablet 0   isosorbide mononitrate (IMDUR) 30 MG 24 hr tablet Take 1 tablet (30 mg total) by mouth daily. 90 tablet 3   lanthanum (FOSRENOL) 1000 MG chewable tablet Chew 2,000-3,000 mg by mouth See admin instructions. Take 3 tablets (3000 mg) by mouth with meals and take 2 tablets (2000 mg) with snacks     lidocaine-prilocaine (EMLA) cream Apply 1 application  topically every Monday, Wednesday, and Friday with hemodialysis.     meclizine (ANTIVERT) 25 MG tablet TAKE 1 TABLET(25 MG)  BY MOUTH THREE TIMES DAILY AS NEEDED FOR DIZZINESS (Patient taking differently: Take 25 mg by mouth 3 (three) times daily as needed for dizziness or nausea.) 30 tablet 1   nebivolol (BYSTOLIC) 10 MG tablet Take 1 tablet (10 mg total) by mouth in the morning and at bedtime. 180 tablet 3   pantoprazole (PROTONIX) 40 MG tablet TAKE 1 TABLET(40 MG) BY MOUTH DAILY (Patient taking differently: Take 40 mg by mouth daily.) 30 tablet 0   VITAMIN D PO Take by mouth.     No current facility-administered medications for this visit.    Allergies as of 07/11/2022   (No Known Allergies)    Family History  Problem Relation Age of Onset   Hypertension Mother    Coronary artery disease Mother    Diabetes Mother    Hypertension Sister    Coronary artery disease Sister    Hypertension Brother    Heart attack Father    Hypertension Son    Heart attack Maternal Aunt    Hypertension Maternal Aunt    Diabetes  Maternal Aunt    Heart attack Maternal Uncle    Hypertension Maternal Uncle    Diabetes Maternal Uncle    Heart attack Paternal Aunt    Hypertension Paternal Aunt    Diabetes Paternal Aunt    Heart attack Paternal Uncle    Hypertension Paternal Uncle    Diabetes Paternal Uncle    Heart attack Maternal Grandmother    Heart attack Maternal Grandfather    Heart attack Paternal Grandmother    Heart attack Paternal Grandfather    Colon cancer Neg Hx     Social History   Socioeconomic History   Marital status: Married    Spouse name: Not on file   Number of children: Not on file   Years of education: Not on file   Highest education level: Not on file  Occupational History   Occupation: Merchandiser, retail: [employer]  Tobacco Use   Smoking status: Never    Passive exposure: Never   Smokeless tobacco: Never  Vaping Use   Vaping Use: Never used  Substance and Sexual Activity   Alcohol use: No   Drug use: No   Sexual activity: Yes    Birth control/protection: Surgical  Other Topics Concern   Not on file  Social History Narrative   Works at [employer] in [place].    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   Also has to get items from high shelves-causes achy pain in shoulder area      Married.   Children are grown, out of house.   Social Determinants of Health   Financial Resource Strain: Low Risk  (09/09/2021)   Overall Financial Resource Strain (CARDIA)    Difficulty of Paying Living Expenses: Not hard at all  Food Insecurity: No Food Insecurity (09/09/2021)   Hunger Vital Sign    Worried About Running Out of Food in the Last Year: Never true    Ran Out of Food in the Last Year: Never true  Transportation Needs: No Transportation Needs (09/09/2021)   PRAPARE - Hydrologist (Medical): No    Lack of Transportation (Non-Medical): No  Physical Activity: Inactive (09/09/2021)   Exercise Vital Sign    Days of Exercise per  Week: 0 days    Minutes of Exercise per Session: 0 min  Stress: No Stress Concern Present (09/09/2021)  [place]    Feeling of Stress : Not at all  Social Connections: Socially Integrated (09/09/2021)   Social Connection and Isolation Panel [NHANES]    Frequency of Communication with Friends and Family: More than three times a week    Frequency of Social Gatherings with Friends and Family: More than three times a week    Attends Religious Services: More than 4 times per year    Active Member of [employer] or Organizations: Yes    Attends Music therapist: More than 4 times per year    Marital Status: Married  Human resources officer Violence: Not At Risk (09/09/2021)   Humiliation, Afraid, Rape, and Kick questionnaire    Fear of Current or Ex-Partner: No    Emotionally Abused: No    Physically Abused: No    Sexually Abused: No     Review of Systems   Gen: Denies any fever, chills, fatigue, weight loss, lack of appetite.  CV: Denies chest pain, heart palpitations, peripheral edema, syncope.  Resp: Denies shortness of breath at rest or with exertion. Denies wheezing or cough.  GI: see HPI GU : Denies urinary burning, urinary frequency, urinary hesitancy MS: Denies joint pain, muscle weakness, cramps, or limitation of movement.  Derm: Denies rash, itching, dry skin Psych: Denies depression, anxiety, memory loss, and confusion Heme: Denies bruising, bleeding, and enlarged lymph nodes.   Physical Exam   BP 138/75   Pulse 74   Temp 98.3 F (36.8 C)   Ht '5\' 1"'$  (1.549 m)   Wt 183 lb (83 kg)   BMI 34.58 kg/m  General:   Alert and oriented. Pleasant and cooperative. Well-nourished and well-developed.  Head:  Normocephalic and atraumatic. Eyes:  Without icterus Abdomen:  +BS, soft, non-tender and non-distended. No HSM noted. No guarding or rebound. No masses appreciated.  Rectal:  Deferred  Msk:  Symmetrical  without gross deformities. Normal posture. Extremities:  Without edema. Neurologic:  Alert and  oriented x4;  grossly normal neurologically. Skin:  Intact without significant lesions or rashes. Psych:  Alert and cooperative. Normal mood and affect.   Assessment   Megan [name] is a 61 y.o. female presenting today in follow-up with a history of  internal hemorrhoids s/p banding in 2021 and recently in April/May 2023, [place] in July 2022, anemia, HFpEF, breast cancer, GERD, HTN, ESRD on dialysis, CAD, HLD, and diabetes presenting today in follow-up.   GERD: controlled on once daily PPI. No alarm signs/symptoms.  Rectal bleeding: in setting of hemorrhoids. She is not interested in any further banding. We discussed potential referral for surgery. She will be placed back on the renal transplant list in 2024, so she would like to hold off on this currently.    PLAN    PPI daily Return in 1 year    Annitta Needs, PhD, [facility]

## 2022-07-11 NOTE — Patient Instructions (Signed)
We will see you in 1 year!  Please call with any concerns in the meantime!  I enjoyed seeing you again today! As you know, I value our relationship and want to provide genuine, compassionate, and quality care. I welcome your feedback. If you receive a survey regarding your visit,  I greatly appreciate you taking time to fill this out. See you next time!  Annitta Needs, PhD, ANP-BC [facility]enter[facility]

## 2022-07-12 ENCOUNTER — Encounter (HOSPITAL_COMMUNITY): Payer: Medicare Other | Admitting: Physical Therapy

## 2022-07-12 DIAGNOSIS — N186 End stage renal disease: Secondary | ICD-10-CM | POA: Diagnosis not present

## 2022-07-12 DIAGNOSIS — D631 Anemia in chronic kidney disease: Secondary | ICD-10-CM | POA: Diagnosis not present

## 2022-07-12 DIAGNOSIS — N2581 Secondary hyperparathyroidism of renal origin: Secondary | ICD-10-CM | POA: Diagnosis not present

## 2022-07-12 DIAGNOSIS — Z992 Dependence on renal dialysis: Secondary | ICD-10-CM | POA: Diagnosis not present

## 2022-07-12 DIAGNOSIS — D509 Iron deficiency anemia, unspecified: Secondary | ICD-10-CM | POA: Diagnosis not present

## 2022-07-12 DIAGNOSIS — I159 Secondary hypertension, unspecified: Secondary | ICD-10-CM | POA: Diagnosis not present

## 2022-07-13 ENCOUNTER — Inpatient Hospital Stay (HOSPITAL_BASED_OUTPATIENT_CLINIC_OR_DEPARTMENT_OTHER): Payer: Medicare Other | Admitting: Hematology

## 2022-07-13 DIAGNOSIS — Z7982 Long term (current) use of aspirin: Secondary | ICD-10-CM | POA: Insufficient documentation

## 2022-07-13 DIAGNOSIS — G629 Polyneuropathy, unspecified: Secondary | ICD-10-CM | POA: Diagnosis not present

## 2022-07-13 DIAGNOSIS — Z923 Personal history of irradiation: Secondary | ICD-10-CM | POA: Diagnosis not present

## 2022-07-13 DIAGNOSIS — M8580 Other specified disorders of bone density and structure, unspecified site: Secondary | ICD-10-CM | POA: Insufficient documentation

## 2022-07-13 DIAGNOSIS — I132 Hypertensive heart and chronic kidney disease with heart failure and with stage 5 chronic kidney disease, or end stage renal disease: Secondary | ICD-10-CM | POA: Diagnosis not present

## 2022-07-13 DIAGNOSIS — Z170 Estrogen receptor positive status [ER+]: Secondary | ICD-10-CM | POA: Insufficient documentation

## 2022-07-13 DIAGNOSIS — C50211 Malignant neoplasm of upper-inner quadrant of right female breast: Secondary | ICD-10-CM | POA: Insufficient documentation

## 2022-07-13 DIAGNOSIS — Z79899 Other long term (current) drug therapy: Secondary | ICD-10-CM | POA: Diagnosis not present

## 2022-07-13 DIAGNOSIS — N186 End stage renal disease: Secondary | ICD-10-CM | POA: Insufficient documentation

## 2022-07-13 DIAGNOSIS — Z79811 Long term (current) use of aromatase inhibitors: Secondary | ICD-10-CM | POA: Diagnosis not present

## 2022-07-13 DIAGNOSIS — E559 Vitamin D deficiency, unspecified: Secondary | ICD-10-CM | POA: Diagnosis not present

## 2022-07-13 MED ORDER — ANASTROZOLE 1 MG PO TABS: 1.0000 mg | ORAL_TABLET | Freq: Every day | ORAL | 6 refills | Status: DC

## 2022-07-13 NOTE — Progress Notes (Signed)
Megan [name] [address], [place] [address]   Patient Care Team: [name], MD as PCP - General (Family Medicine) [name], [name], MD as PCP - Cardiology (Cardiology) [name] [name], MD (Gastroenterology) [name], MD as Consulting Physician (Cardiology) [name], RN as Oncology Nurse Navigator (Oncology)  SUMMARY OF ONCOLOGIC HISTORY: Oncology History  Malignant neoplasm of upper-inner quadrant of right female breast [facility])  09/18/2018 Initial Diagnosis   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[place])   09/26/2018 Cancer Staging   Staging form: Breast, AJCC 8th Edition - Clinical stage from 09/26/2018: Stage IB (cT1c, cN0, cM0, G3, ER+, PR-, HER2-) - Signed by Derek Jack, MD on 09/26/2018   10/30/2018 - 01/01/2019 Chemotherapy   Patient is on Treatment Plan : BREAST Adjuvant AC q21d       CHIEF COMPLIANT: Follow-up of right breast cancer   INTERVAL HISTORY: Megan [name] is a 61 y.o. female here for follow-up of right breast cancer.  She has a dizziness from chronic vertigo which is stable.  She is taking Nephro-Vite tablets with vitamin D in it.  She does not report any new onset pains.  Overall she describes her condition is stable from last visit 6 months ago.  REVIEW OF SYSTEMS:   Review of Systems  Constitutional:  Negative for appetite change and fatigue.  Cardiovascular:  Negative for leg swelling.  Endocrine: Negative for hot flashes.  Neurological:  Positive for dizziness and headaches.  All other systems reviewed and are negative.   I have reviewed the past medical history, past surgical history, social history and family history with the patient and they are unchanged from previous note.   ALLERGIES:   has No Known Allergies.   MEDICATIONS:  Current Outpatient Medications  Medication Sig Dispense Refill   amLODipine (NORVASC) 10 MG tablet TAKE 1 TABLET BY MOUTH AT  BEDTIME 90 tablet 0   anastrozole (ARIMIDEX) 1 MG tablet TAKE 1 TABLET(1 MG) BY MOUTH DAILY (Patient taking differently: Take 1 mg by mouth daily.) 30 tablet 4   aspirin EC 81 MG tablet Take 81 mg by mouth daily.     atorvastatin (LIPITOR) 20 MG tablet Take 1 tablet (20 mg total) by mouth daily. TAKE 1 TABLET DAILY 90 tablet 3   cinacalcet (SENSIPAR) 30 MG tablet Take 2 tablets (60 mg total) by mouth daily. (Patient taking differently: Take 30 mg by mouth daily.) 90 tablet 0   DULoxetine (CYMBALTA) 30 MG capsule TAKE 1 CAPSULE(30 MG) BY MOUTH DAILY (Patient taking differently: Take 30 mg by mouth daily.) 90 capsule 1   gabapentin (NEURONTIN) 300 MG capsule TAKE 1 CAPSULE(300 MG) BY MOUTH AT BEDTIME (Patient taking differently: Take 300 mg by mouth at bedtime.) 90 capsule 1   HYDROcodone-acetaminophen (NORCO/VICODIN) 5-325 MG tablet Take 1 tablet by mouth every 6 (six) hours as needed. 20 tablet 0   isosorbide mononitrate (IMDUR) 30 MG 24 hr tablet Take 1 tablet (30 mg total) by mouth daily. 90 tablet 3   lanthanum (FOSRENOL) 1000 MG chewable tablet Chew 2,000-3,000 mg by mouth See admin instructions. Take 3 tablets (3000 mg) by mouth with meals and take 2 tablets (2000 mg) with snacks     lidocaine-prilocaine (EMLA) cream Apply 1 application  topically every Monday, Wednesday, and Friday with hemodialysis.     meclizine (ANTIVERT) 25 MG tablet TAKE 1 TABLET(25 MG) BY MOUTH THREE TIMES DAILY AS NEEDED FOR DIZZINESS (Patient  taking differently: Take 25 mg by mouth 3 (three) times daily as needed for dizziness or nausea.) 30 tablet 1   nebivolol (BYSTOLIC) 10 MG tablet Take 1 tablet (10 mg total) by mouth in the morning and at bedtime. 180 tablet 3   pantoprazole (PROTONIX) 40 MG tablet TAKE 1 TABLET(40 MG) BY MOUTH DAILY (Patient taking differently: Take 40 mg by mouth daily.) 30 tablet 0   VITAMIN D PO Take by mouth.     No current facility-administered medications for this visit.     PHYSICAL  EXAMINATION: Performance status (ECOG): 1 - Symptomatic but completely ambulatory  There were no vitals filed for this visit.  Wt Readings from Last 3 Encounters:  07/11/22 183 lb (83 kg)  06/05/22 179 lb 9.6 oz (81.5 kg)  05/01/22 176 lb 9.6 oz (80.1 kg)   Physical Exam Vitals reviewed.  Constitutional:      Appearance: Normal appearance. She is obese.  Cardiovascular:     Rate and Rhythm: Normal rate and regular rhythm.     Pulses: Normal pulses.     Heart sounds: Normal heart sounds.  Pulmonary:     Effort: Pulmonary effort is normal.     Breath sounds: Normal breath sounds.  Chest:  Breasts:    Right: Absent. No swelling, bleeding, mass, skin change (mastectomy site WNL) or tenderness.     Left: Absent. No swelling, bleeding, mass, skin change (mastectomy site WNL) or tenderness.  Abdominal:     Palpations: Abdomen is soft. There is no hepatomegaly, splenomegaly or mass.     Tenderness: There is no abdominal tenderness.  Musculoskeletal:     Right lower leg: No edema.     Left lower leg: No edema.  Lymphadenopathy:     Upper Body:     Right upper body: No supraclavicular or axillary adenopathy.     Left upper body: No supraclavicular or axillary adenopathy.  Neurological:     General: No focal deficit present.     Mental Status: She is alert and oriented to person, place, and time.  Psychiatric:        Mood and Affect: Mood normal.        Behavior: Behavior normal.    Breast Exam Chaperone: Thana Ates     LABORATORY DATA:  I have reviewed the data as listed    Latest Ref Rng & Units 07/04/2022    8:02 AM 04/27/2022    4:30 PM 02/26/2022    9:40 AM  CMP  Glucose 70 - 99 mg/dL 130  102  115   BUN 6 - 20 mg/dL 27  46  40   Creatinine 0.44 - 1.00 mg/dL 6.86  10.37  11.17   Sodium 135 - 145 mmol/L 139  140  140   Potassium 3.5 - 5.1 mmol/L 3.4  3.9  4.4   Chloride 98 - 111 mmol/L 97  96  100   CO2 22 - 32 mmol/L _0 Calcium 8.9 - 10.3 mg/dL 9.7   10.2  9.6   Total Protein 6.5 - 8.1 g/dL 8.3  7.8  7.5   Total Bilirubin 0.3 - 1.2 mg/dL 0.8  0.7  0.6   Alkaline Phos 38 - 126 U/L 94  85  82   AST 15 - 41 U/L _1 ALT 0 - 44 U/L _2 Lab Results  Component Value Date   OEH212  29.5 (H) 07/04/2022   CAN153 26.3 (H) 01/03/2022   CAN153 28.0 (H) 08/23/2021   Lab Results  Component Value Date   WBC 8.4 07/04/2022   HGB 11.9 (L) 07/04/2022   HCT 36.4 07/04/2022   MCV 106.4 (H) 07/04/2022   PLT 273 07/04/2022   NEUTROABS 5.8 07/04/2022    ASSESSMENT:  1.  Stage Ib (T1CN0) right breast cancer, ER positive, PR and HER-2 negative: -Right lumpectomy and sentinel lymph node biopsy on 09/18/2018 shows 1.5 cm IDC, grade 3, associated with high-grade DCIS, negative margins, 0/3 lymph nodes positive, ER 50%, PR negative and HER-2 negative, Ki-67 60%. -Oncotype DX recurrence score 47.  Distant recurrence at 9 years with tamoxifen alone is 36%.  Absolute chemotherapy benefit is more than 15%. -Adjuvant chemotherapy with 4 cycles of AC from 10/30/2018 through 01/01/2019. -She was started on anastrozole in June 2020. -Mammogram on 09/02/2019 showed calcifications in the posterior aspect of upper outer quadrant of the right breast. -Right partial mastectomy on 02/18/2020 shows high-grade DCIS, 1.5 cm, no invasive carcinoma, resection margins negative.  0/11 lymph nodes.  ER 30% positive, PR negative. -Left simple mastectomy on 06/15/2020 was benign.  Right breast biopsy shows microscopic focus of invasive ductal carcinoma in the background of extensive high-grade DCIS. -Right breast lumpectomy on 07/13/2020 shows multifocal invasive ductal carcinoma, grade 3, largest measuring 1.2 cm.  Extensive high-grade DCIS with necrosis.  Margins are free.  PT1CNX.  There are multiple foci of invasive carcinoma arising from extensive DCIS.  Several small foci of invasive tumor arising from DCIS indicating new primaries.  ER/PR/HER-2 is negative.  Ki-67 is  20%. -PET scan on 09/14/2020 shows areas of nodularity with discrete nodule in the fat of the inferior chest wall within the subcutaneous tissues. -Ultrasound of the right chest wall shows masslike abnormality in the upper inner aspect of the right anterior chest at 1 o'clock position measuring 4.1 cm in greatest dimension.  1.7 cm hypoechoic mass in the central anterior right chest concerning for malignancy.  Possible borderline enlarged residual right axillary lymph node. -Right breast biopsy on 10/12/2020 shows 1.3 cm invasive ductal carcinoma, focally 0.1 cm from superior margin.  DCIS is less than 0.1 cm from superior margin.  PT1CPNX. - Right mastectomy followed by radiation therapy completed in May 2022.   2.  Osteopenia: -Bone density on 01/20/2019 shows T score -1.9. - DEXA scan (03/24/2021) T score -2.4   PLAN:  1.  Recurrent right breast cancer, triple receptor negative: - She is tolerating anastrozole very well. - Physical examination today shows bilateral mastectomy sites within normal limits.  There is a small nodule above the right mastectomy scar laterally which is stable.  No palpable adenopathy. - Reviewed labs today which showed normal LFTs.  CBC was grossly normal.  CA 15-3 was 29.5 and stable. - Recommend follow-up in 6 months with repeat labs.  Continue anastrozole.     2.  Osteopenia/vitamin D deficiency: - Vitamin D levels are low at 19.3 but improved from last time at 15. - Continue Nephro-Vite.  Add vitamin D 400 international units daily.   3.  ESRD on HD: - Continue HD on Monday, Wednesday and Friday under the direction of Dr. Marval Regal.   4.  Neuropathy: - Continue gabapentin at bedtime.  Breast Cancer therapy associated bone loss: I have recommended calcium, Vitamin D and weight bearing exercises.  Orders placed this encounter:  No orders of the defined types were placed in this encounter.  The patient has a good understanding of the overall plan. She  agrees with it. She will call with any problems that may develop before the next visit here.  Derek Jack, MD [place] [phone]

## 2022-07-13 NOTE — Patient Instructions (Signed)
[facility]  Discharge Instructions  You were seen and examined today by Dr. Delton Coombes.  Your labs were stable. Your Vitamin D remains low, your nephrologist can follow-up on that.  Follow-up as scheduled in 6 months.  Thank you for choosing [facility] to provide your oncology and hematology care.   To afford each patient quality time with our provider, please arrive at least 15 minutes before your scheduled appointment time. You may need to reschedule your appointment if you arrive late (10 or more minutes). Arriving late affects you and other patients whose appointments are after yours.  Also, if you miss three or more appointments without notifying the office, you may be dismissed from the clinic at the provider's discretion.    Again, thank you for choosing [facility].  Our hope is that these requests will decrease the amount of time that you wait before being seen by our physicians.   If you have a lab appointment with the [facility] please come in thru the Main Entrance and check in at the main information desk.           _____________________________________________________________  Should you have questions after your visit to [facility], please contact our office at [phone] and follow the prompts.  Our office hours are 8:00 a.m. to 4:30 p.m. Monday - Thursday and 8:00 a.m. to 2:30 p.m. Friday.  Please note that voicemails left after 4:00 p.m. may not be returned until the following business day.  We are closed weekends and all major holidays.  You do have access to a nurse 24-7, just call the main number to the clinic [phone] and do not press any options, hold on the line and a nurse will answer the phone.    For prescription refill requests, have your pharmacy contact our office and allow 72 hours.    Masks are optional in the cancer centers. If you would like for your care team to  wear a mask while they are taking care of you, please let them know. You may have one support person who is at least 60 years old accompany you for your appointments.

## 2022-07-14 DIAGNOSIS — I159 Secondary hypertension, unspecified: Secondary | ICD-10-CM | POA: Diagnosis not present

## 2022-07-14 DIAGNOSIS — N2581 Secondary hyperparathyroidism of renal origin: Secondary | ICD-10-CM | POA: Diagnosis not present

## 2022-07-14 DIAGNOSIS — N040 Nephrotic syndrome with minor glomerular abnormality: Secondary | ICD-10-CM | POA: Diagnosis not present

## 2022-07-14 DIAGNOSIS — D509 Iron deficiency anemia, unspecified: Secondary | ICD-10-CM | POA: Diagnosis not present

## 2022-07-14 DIAGNOSIS — Z992 Dependence on renal dialysis: Secondary | ICD-10-CM | POA: Diagnosis not present

## 2022-07-14 DIAGNOSIS — E119 Type 2 diabetes mellitus without complications: Secondary | ICD-10-CM | POA: Diagnosis not present

## 2022-07-14 DIAGNOSIS — D631 Anemia in chronic kidney disease: Secondary | ICD-10-CM | POA: Diagnosis not present

## 2022-07-14 DIAGNOSIS — E1129 Type 2 diabetes mellitus with other diabetic kidney complication: Secondary | ICD-10-CM | POA: Diagnosis not present

## 2022-07-14 DIAGNOSIS — N186 End stage renal disease: Secondary | ICD-10-CM | POA: Diagnosis not present

## 2022-07-17 DIAGNOSIS — I159 Secondary hypertension, unspecified: Secondary | ICD-10-CM | POA: Diagnosis not present

## 2022-07-17 DIAGNOSIS — E1129 Type 2 diabetes mellitus with other diabetic kidney complication: Secondary | ICD-10-CM | POA: Diagnosis not present

## 2022-07-17 DIAGNOSIS — N186 End stage renal disease: Secondary | ICD-10-CM | POA: Diagnosis not present

## 2022-07-17 DIAGNOSIS — D631 Anemia in chronic kidney disease: Secondary | ICD-10-CM | POA: Diagnosis not present

## 2022-07-17 DIAGNOSIS — D509 Iron deficiency anemia, unspecified: Secondary | ICD-10-CM | POA: Diagnosis not present

## 2022-07-17 DIAGNOSIS — Z992 Dependence on renal dialysis: Secondary | ICD-10-CM | POA: Diagnosis not present

## 2022-07-19 ENCOUNTER — Ambulatory Visit (HOSPITAL_COMMUNITY): Payer: Medicare Other | Attending: Family Medicine | Admitting: Physical Therapy

## 2022-07-19 DIAGNOSIS — E1129 Type 2 diabetes mellitus with other diabetic kidney complication: Secondary | ICD-10-CM | POA: Diagnosis not present

## 2022-07-19 DIAGNOSIS — I159 Secondary hypertension, unspecified: Secondary | ICD-10-CM | POA: Diagnosis not present

## 2022-07-19 DIAGNOSIS — Z992 Dependence on renal dialysis: Secondary | ICD-10-CM | POA: Diagnosis not present

## 2022-07-19 DIAGNOSIS — R42 Dizziness and giddiness: Secondary | ICD-10-CM | POA: Insufficient documentation

## 2022-07-19 DIAGNOSIS — N186 End stage renal disease: Secondary | ICD-10-CM | POA: Diagnosis not present

## 2022-07-19 DIAGNOSIS — D631 Anemia in chronic kidney disease: Secondary | ICD-10-CM | POA: Diagnosis not present

## 2022-07-19 DIAGNOSIS — D509 Iron deficiency anemia, unspecified: Secondary | ICD-10-CM | POA: Diagnosis not present

## 2022-07-19 NOTE — Therapy (Signed)
OUTPATIENT PHYSICAL THERAPY VESTIBULAR TREATMENT     Patient Name: Megan [name] MRN: [id number] DOB:1961-07-25, 61 y.o., female Today's Date: 07/19/2022  END OF SESSION:  PT End of Session - 07/19/22 1326     Visit Number 2    Number of Visits 8    Date for PT Re-Evaluation 07/31/22    Authorization Type Medicare Part A    Progress Note Due on Visit 10    PT Start Time 1339    PT Stop Time 1417    PT Time Calculation (min) 38 min    Activity Tolerance Patient tolerated treatment well;Patient limited by fatigue    Behavior During Therapy [facility] for tasks assessed/performed             Past Medical History:  Diagnosis Date   (HFpEF) heart failure with preserved ejection fraction ([place])    a. 01/2019 Echo: EF 55-60%, mild conc LVH. DD.  Torn MV chordae.   Anemia    Atypical chest pain    a. 08/2018 MV: EF 59%, no ischemia; b. 02/2019 Cath: nonobs dzs.   Blood transfusion without reported diagnosis    Breast cancer ([place]) 10/12/2020   Cataract    ESRD (end stage renal disease) on dialysis [facility])    a. HD T, T, S   Essential hypertension, benign    GERD (gastroesophageal reflux disease)    Headache    Hemorrhoids    Mixed hyperlipidemia    Morbid obesity (HCC)    Non-obstructive CAD (coronary artery disease)    a. 02/2019 CathL LM nl, LAD 73m LCX nl, RCA 25p, EF 55-65%.   PONV (postoperative nausea and vomiting)    S/P colonoscopy Jan 2011   Dr. 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    Type 2 diabetes mellitus [place]    Wears glasses    Past Surgical History:  Procedure Laterality Date   ABDOMINAL HYSTERECTOMY     APPENDECTOMY     ARTERY BIOPSY N/A 05/09/2018   Procedure: RIGHT TEMPORAL ARTERY BIOPSY;  Surgeon: WJudeth Horn MD;  [place]  Service: General;  Laterality: N/A;   BREAST BIOPSY Right 06/15/2020   Procedure: RIGHT BREAST BIOPSY;  Surgeon: JAviva Signs MD;  Location: [facility];   Service: General;  Laterality: Right;   CATARACT EXTRACTION W/PHACO Left 02/09/2017   Procedure: CATARACT EXTRACTION PHACO AND INTRAOCULAR LENS PLACEMENT LEFT EYE;  Surgeon: HTonny Branch MD;  Location: [facility];  Service: Ophthalmology;  Laterality: Left;  CDE: 4.89   CATARACT EXTRACTION W/PHACO Right 06/04/2017   Procedure: CATARACT EXTRACTION PHACO AND INTRAOCULAR LENS PLACEMENT (IOC);  Surgeon: HTonny Branch MD;  Location: [facility];  Service: Ophthalmology;  Laterality: Right;  CDE: 4.12   CHOLECYSTECTOMY  09/29/2011   Procedure: LAPAROSCOPIC CHOLECYSTECTOMY;  Surgeon: MJamesetta So MD;  Location: [facility];  Service: General;  Laterality: N/A;   COLONOSCOPY  08/2009   Dr. HBenson Norway sessile polyp (benign lymphoid), large hemorrhoids, repeat 5-10 years   COLONOSCOPY N/A 06/12/2016   prominent hemorrhoids   COLONOSCOPY WITH PROPOFOL N/A 06/16/2021   Procedure: COLONOSCOPY WITH PROPOFOL;  Surgeon: RDaneil Dolin MD;  [facility];  Service: Endoscopy;  Laterality: N/A;  9:30am (dialysis pt)   ESOPHAGOGASTRODUODENOSCOPY  09/05/2011   RION:GEXBMhiatal hernia; remainder of exam normal. No explanation for patient's abdominal pain with today's examination   ESOPHAGOGASTRODUODENOSCOPY N/A 12/17/2013   Dr. RGala Romney gastric erythema, erosion, mild chronic inflammation on path  ESOPHAGOGASTRODUODENOSCOPY (EGD) WITH PROPOFOL N/A 06/16/2021   Procedure: ESOPHAGOGASTRODUODENOSCOPY (EGD) WITH PROPOFOL;  Surgeon: Daneil Dolin, MD;  Location: [facility];  Service: Endoscopy;  Laterality: N/A;   EXCISION OF BREAST BIOPSY Right 10/12/2020   Procedure: EXCISION OF RIGHT BREAST BIOPSY;  Surgeon: Aviva Signs, MD;  Location: [facility];  Service: General;  Laterality: Right;   LAPAROSCOPIC APPENDECTOMY  09/29/2011   Procedure: APPENDECTOMY LAPAROSCOPIC;  Surgeon: Jamesetta So, MD;  Location: [facility];  Service: General;;  incidental appendectomy   LEFT HEART CATH AND CORONARY ANGIOGRAPHY N/A 02/28/2019    Procedure: LEFT HEART CATH AND CORONARY ANGIOGRAPHY;  Surgeon: Jettie Booze, MD;  Location: [place] [facility];  Service: Cardiovascular;  Laterality: N/A;   MASTECTOMY MODIFIED RADICAL Right 02/18/2020   Procedure: MASTECTOMY MODIFIED RADICAL;  Surgeon: Aviva Signs, MD;  Location: [facility];  Service: General;  Laterality: Right;   MASTECTOMY, PARTIAL Right 07/13/2020   Procedure: RIGHT PARTIAL MASTECTOMY;  Surgeon: Aviva Signs, MD;  Location: [facility];  Service: General;  Laterality: Right;   PARTIAL MASTECTOMY WITH NEEDLE LOCALIZATION AND AXILLARY SENTINEL LYMPH NODE BX Right 09/18/2018   Procedure: RIGHT PARTIAL MASTECTOMY AFTER NEEDLE LOCALIZATION, SENTINEL LYMPH NODE BIOPSY RIGHT AXILLA;  Surgeon: Aviva Signs, MD;  Location: [facility];  Service: General;  Laterality: Right;   POLYPECTOMY  06/16/2021   Procedure: POLYPECTOMY;  Surgeon: Daneil Dolin, MD;  Location: [facility];  Service: Endoscopy;;   SIMPLE MASTECTOMY WITH AXILLARY SENTINEL NODE BIOPSY Left 06/15/2020   Procedure: LEFT SIMPLE MASTECTOMY;  Surgeon: Aviva Signs, MD;  Location: [facility];  Service: General;  Laterality: Left;   Patient Active Problem List   Diagnosis Date Noted   Dysuria 04/05/2022   Prolapsed internal hemorrhoids, grade 2 12/07/2021   LUQ pain 05/11/2021   Fatigue 05/03/2021   C. difficile colitis 02/15/2021   Dizziness    Breast mass, right    History of right breast cancer    Lobar pneumonia, unspecified organism ([place] [address]) 07/03/2020   Pleural effusion, right    S/P left mastectomy 06/15/2020   Allergy, unspecified, initial encounter 03/03/2020   Anaphylactic shock, unspecified, initial encounter 03/03/2020   Breast cancer ([place]) 02/18/2020   History of modified radical mastectomy of right breast 02/18/2020   S/P mastectomy, right 02/18/2020   ESRD (end stage renal disease) on dialysis ([place])    Grade III hemorrhoids 07/31/2019   Fluid overload, unspecified 05/24/2019   Anaphylactic reaction  due to adverse effect of correct drug or medicament properly administered, initial encounter 04/30/2019   Unspecified protein-calorie malnutrition ([place]) 03/03/2019   Hypertensive heart disease with heart failure ([place]) 03/01/2019   Atherosclerotic heart disease of native coronary artery without angina pectoris 03/01/2019   Chest pain 02/28/2019   Chest pain with high risk for cardiac etiology 02/28/2019   Elevated troponin 02/03/2019   ESRD (end stage renal disease) ([place]) 77/82/4235   Acute diastolic CHF (congestive heart failure) ([place]) 02/03/2019   Hypokalemia 02/03/2019   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[place])    Hypocalcemia 09/03/2018   Hypothyroidism, unspecified 09/21/2017   Anemia in chronic kidney disease 04/12/2017   Iron deficiency anemia, unspecified 04/12/2017   Abnormal results of kidney function studies 04/11/2017   Acidosis 04/11/2017   Anemia, unspecified 04/11/2017   Coagulation defect, unspecified ([place]) 04/11/2017   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, with stage 5 chronic kidney disease, or end stage renal disease ([place]) 04/11/2017   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 04/11/2017   Other disorders of phosphorus metabolism 04/11/2017   Pain, unspecified 04/11/2017   Proteinuria, unspecified 04/11/2017   Pruritus, unspecified 04/11/2017   Unspecified kidney failure 04/11/2017   Encounter for prophylactic removal of breast 04/04/2017   Rectal bleeding 06/08/2016   Vaginal discharge 12/30/2015   Abdominal pain, epigastric 11/26/2013   GERD (gastroesophageal reflux disease)    Type 2 diabetes mellitus ([place])    CKD (chronic kidney disease) stage 3, GFR 30-59 ml/min (HCC)    External hemorrhoid 08/01/2011   Hematochezia 07/13/2011   Dyspnea 04/21/2010   Atypical chest pain 04/21/2010   Mixed hyperlipidemia 03/11/2008   OBESITY 03/11/2008   Essential hypertension, benign  03/11/2008    PCP: [name], MD REFERRING PROVIDER: [name], MD  REFERRING DIAG: H81.10 (ICD-10-CM) - Benign paroxysmal positional vertigo, unspecified laterality  THERAPY DIAG:  Dizziness and giddiness  ONSET DATE: summer of this year  Rationale for Evaluation and Treatment: Rehabilitation  SUBJECTIVE:   SUBJECTIVE STATEMENT: Doing ok. Has good days and bad days with dizziness. Has a headache today.    Eval: Patient reports insidious onset of dizziness and vertigo; sometimes doing nothing at all and it comes on.  Sitting in the car or walking in the hallway or laying in the bed; husband holds her hand most of the time walking. Pt accompanied by: significant other  PERTINENT HISTORY: dialysis MWF; 6 years Hx of breast cancer Wants a kidney transplant  PAIN:  Are you having pain? Yes: NPRS scale: 10/10 Pain location: Headache Pain description: sore Aggravating factors: dialysis Relieving factors: rest  PRECAUTIONS: None  WEIGHT BEARING RESTRICTIONS: No  FALLS: Has patient fallen in last 6 months? No  LIVING ENVIRONMENT: Lives with: lives with their spouse Lives in: House/apartment Stairs: Yes: External: 1 steps; none Has following equipment at home: None  PLOF: Needs assistance with ADLs, Needs assistance with homemaking, and Needs assistance with gait  PATIENT GOALS: stop this dizziness  OBJECTIVE:   DIAGNOSTIC FINDINGS: none  COGNITION: Overall cognitive status: Within functional limits for tasks assessed   SENSATION: Some numbness in hands occassionally  EDEMA: mild swelling left arm    POSTURE:  rounded shoulders and forward head  Cervical ROM:  grossly wfl; no pain complaint  Active A/PROM (deg) eval  Flexion   Extension   Right lateral flexion   Left lateral flexion   Right rotation   Left rotation   (Blank rows = not tested)  STRENGTH: grossly wfl bilateral upper extremity  LOWER EXTREMITY MMT:   MMT  Right eval Left eval  Hip flexion    Hip abduction    Hip adduction    Hip internal rotation    Hip external rotation    Knee flexion    Knee extension    Ankle dorsiflexion    Ankle plantarflexion    Ankle inversion    Ankle eversion    (Blank rows = not tested)  BED MOBILITY:  Sit to supine SBA  TRANSFERS: Assistive device utilized: None  Sit to stand: Modified independence Stand to sit: Modified independence Chair to chair: Modified independence    GAIT: Gait pattern:  holds her husbands hand, decreased arm swing- Right, decreased arm swing- Left, decreased step length- Right, and decreased step length- Left Distance walked: 75 ft Assistive device utilized: None Level of assistance: CGA Comments: holds her husbands hand  FUNCTIONAL TESTS:  5 times sit to stand: 19.95 sec  DGI 1.  Gait level surface (2) Mild Impairment: Walks 20', uses assistive devices, slower speed, mild gait deviations. 2. Change in gait speed (2) Mild Impairment: Is able to change speed but demonstrates mild gait deviations, or not gait deviations but unable to achieve a significant change in velocity, or uses an assistive device. 3. Gait with horizontal head turns (2) Mild Impairment: Performs head turns smoothly with slight change in gait velocity, i.e., minor disruption to smooth gait path or uses walking aid. 4. Gait with vertical head turns (2) Mild Impairment: Performs head turns smoothly with slight change in gait velocity, i.e., minor disruption to smooth gait path or uses walking aid. 5. Gait and pivot turn (2) Mild Impairment: Pivot turns safely in > 3 seconds and stops with no loss of balance. 6. Step over obstacle (2) Mild Impairment: Is able to step over box, but must slow down and adjust steps to clear box safely. 7. Step around obstacles (3) Normal: Is able to walk around cones safely without changing gait speed; no evidence of imbalance. 8. Stairs (2) Mild Impairment:  Alternating feet, must use rail.  TOTAL SCORE: 43 / 24  PATIENT SURVEYS:  FOTO 37  VESTIBULAR ASSESSMENT:  GENERAL OBSERVATION: glasses   SYMPTOM BEHAVIOR:  Subjective history: dizziness spontaneous  Non-Vestibular symptoms:  none  Type of dizziness: Lightheadedness/Faint  Frequency: daily, multiple times  Duration: varies  Aggravating factors: Moving eyes  Relieving factors: no known relieving factors  Progression of symptoms: unchanged  OCULOMOTOR EXAM:  Ocular Alignment: normal  Ocular ROM: No Limitations  Spontaneous Nystagmus: absent  Gaze-Induced Nystagmus: absent but causes her to feel faint  Smooth Pursuits: intact  Saccades: intact  Convergence/Divergence:  cm    VESTIBULAR - OCULAR REFLEX:   Slow VOR: Normal  VOR Cancellation: Normal  Head-Impulse Test: HIT Right: negative HIT Left: negative  Dynamic Visual Acuity:  not assessed   POSITIONAL TESTING: Right Dix-Hallpike: no nystagmus Left Dix-Hallpike: no nystagmus  MOTION SENSITIVITY:  Motion Sensitivity Quotient Intensity: 0 = none, 1 = Lightheaded, 2 = Mild, 3 = Moderate, 4 = Severe, 5 = Vomiting  Intensity  1. Sitting to supine 0  2. Supine to L side   3. Supine to R side   4. Supine to sitting 2  5. L Hallpike-Dix 0  6. Up from L  2  7. R Hallpike-Dix 0  8. Up from R  2  9. Sitting, head tipped to L knee   10. Head up from L knee   11. Sitting, head tipped to R knee   12. Head up from R knee   13. Sitting head turns x5   14.Sitting head nods x5   15. In stance, 180 turn to L    16. In stance, 180 turn to R     OTHOSTATICS: not done  FUNCTIONAL GAIT: 5 times sit to stand: 19.95 sec   VESTIBULAR TREATMENT:                                                                                                   DATE:  07/19/22 Seated  horizontal gaze stabilization 2 x10 (mild dizzy) Seated vertical gaze stabilization 2 x 10 (mild dizzy)  DGI Tandem stance   07/04/22 Physical therapy  evaluation and HEP instruction   Gaze Adaptation:  x1 Viewing Horizontal: Position: sitting and Reps: 10-20 and x1 Viewing Vertical:  Position: sitting and Reps: 10-20 Education details: Patient educated on exam findings, POC, scope of PT, HE. Person educated: Patient Education method: Explanation, Demonstration, and Handouts Education comprehension: verbalized understanding, returned demonstration, verbal cues required, and tactile cues required  HOME EXERCISE PROGRAM: Access Code: [facility] URL: [email or web address] 07/19/22 - Standing Tandem Balance with Counter Support  - 2-3 x daily - 7 x weekly - 1 sets - 3 reps - 15-20 second hold  Date: 07/04/2022 Prepared by: [facility] - Rehab  Exercises - Seated Gaze Stabilization with Head Rotation  - 3-5 x daily - 7 x weekly - 2 sets - 10 reps - Seated Gaze Stabilization with Head Nod  - 3-5 x daily - 7 x weekly - 2 sets - 10 reps GOALS: Goals reviewed with patient? No  SHORT TERM GOALS: Target date: 07/18/2022  Patient will be independent with initial HEP  Baseline: Goal status: INITIAL  2.  Patient will report at least 50% improvement in overall symptoms and/or function to demonstrate improved functional mobility  Baseline:  Goal status: INITIAL  LONG TERM GOALS: Target date: 08/01/2022  atient will be independent in self management strategies to improve quality of life and functional outcomes. Baseline:  Goal status: INITIAL  2.  Patient will be able to walk in the grocery store to shop without having to hold her husbands hand with minimal dizziness Baseline:  Goal status: INITIAL  3.  Patient will improve FOTO score to predicted value Baseline: 37 Goal status: INITIAL  4.  Patient will improve DGI score to at least 20/24 Baseline: 17/24 Goal status: INITIAL   ASSESSMENT:  CLINICAL IMPRESSION: Completed DGI. Patient well challenged with dynamic balance activity, noting lightheadedness and fatigue.  Encouraged breaks for rest between activity. Patient noting ongoing mild dizziness with seated habituation activity. Introduced Conservation officer, nature activity with tandem stance. Patient well challenged with this. Added to HEP and issued handout . Patient will continue to benefit from skilled therapy services to reduce remaining deficits and improve functional ability.    OBJECTIVE IMPAIRMENTS: decreased activity tolerance, decreased balance, decreased endurance, decreased knowledge of condition, decreased mobility, difficulty walking, dizziness, impaired perceived functional ability, and pain.   ACTIVITY LIMITATIONS: carrying, lifting, bending, sitting, standing, squatting, sleeping, stairs, transfers, bed mobility, dressing, hygiene/grooming, locomotion level, and caring for others  PARTICIPATION LIMITATIONS: meal prep, cleaning, laundry, driving, shopping, community activity, and church  PREHAB POTENTIAL: Good  CLINICAL DECISION MAKING: Evolving/moderate complexity  EVALUATION COMPLEXITY: Moderate   PLAN:  PT FREQUENCY: 2x/week  PT DURATION: 4 weeks  Therapeutic exercises, Therapeutic activity, Neuromuscular re-education, Balance training, Gait training, Patient/Family education, Joint manipulation, Joint mobilization, Stair training, Orthotic/Fit training, DME instructions, Aquatic Therapy, Dry Needling, Electrical stimulation, Spinal manipulation, Spinal mobilization, Cryotherapy, Moist heat, Compression bandaging, scar mobilization, Splintting, Taping, Traction, Ultrasound, Ionotophoresis '4mg'$ /ml Dexamethasone, and Manual therapy. Vestibular habituation, vestibular rehab techniques  PLAN FOR NEXT SESSION: Progress balance and habituation  2:17 PM, 07/19/22 Josue Hector PT DPT  Physical Therapist with [facility]  [phone]

## 2022-07-20 ENCOUNTER — Encounter (HOSPITAL_COMMUNITY): Payer: Medicare Other

## 2022-07-21 DIAGNOSIS — Z992 Dependence on renal dialysis: Secondary | ICD-10-CM | POA: Diagnosis not present

## 2022-07-21 DIAGNOSIS — E1129 Type 2 diabetes mellitus with other diabetic kidney complication: Secondary | ICD-10-CM | POA: Diagnosis not present

## 2022-07-21 DIAGNOSIS — I159 Secondary hypertension, unspecified: Secondary | ICD-10-CM | POA: Diagnosis not present

## 2022-07-21 DIAGNOSIS — N186 End stage renal disease: Secondary | ICD-10-CM | POA: Diagnosis not present

## 2022-07-21 DIAGNOSIS — D631 Anemia in chronic kidney disease: Secondary | ICD-10-CM | POA: Diagnosis not present

## 2022-07-21 DIAGNOSIS — D509 Iron deficiency anemia, unspecified: Secondary | ICD-10-CM | POA: Diagnosis not present

## 2022-07-24 ENCOUNTER — Encounter (HOSPITAL_COMMUNITY): Payer: Medicare Other | Admitting: Physical Therapy

## 2022-07-25 DIAGNOSIS — E1129 Type 2 diabetes mellitus with other diabetic kidney complication: Secondary | ICD-10-CM | POA: Diagnosis not present

## 2022-07-25 DIAGNOSIS — I159 Secondary hypertension, unspecified: Secondary | ICD-10-CM | POA: Diagnosis not present

## 2022-07-25 DIAGNOSIS — D631 Anemia in chronic kidney disease: Secondary | ICD-10-CM | POA: Diagnosis not present

## 2022-07-25 DIAGNOSIS — N186 End stage renal disease: Secondary | ICD-10-CM | POA: Diagnosis not present

## 2022-07-25 DIAGNOSIS — D509 Iron deficiency anemia, unspecified: Secondary | ICD-10-CM | POA: Diagnosis not present

## 2022-07-25 DIAGNOSIS — Z992 Dependence on renal dialysis: Secondary | ICD-10-CM | POA: Diagnosis not present

## 2022-07-26 ENCOUNTER — Encounter (HOSPITAL_COMMUNITY): Payer: Medicare Other

## 2022-07-26 DIAGNOSIS — Z992 Dependence on renal dialysis: Secondary | ICD-10-CM | POA: Diagnosis not present

## 2022-07-26 DIAGNOSIS — D509 Iron deficiency anemia, unspecified: Secondary | ICD-10-CM | POA: Diagnosis not present

## 2022-07-26 DIAGNOSIS — D631 Anemia in chronic kidney disease: Secondary | ICD-10-CM | POA: Diagnosis not present

## 2022-07-26 DIAGNOSIS — N186 End stage renal disease: Secondary | ICD-10-CM | POA: Diagnosis not present

## 2022-07-26 DIAGNOSIS — I159 Secondary hypertension, unspecified: Secondary | ICD-10-CM | POA: Diagnosis not present

## 2022-07-26 DIAGNOSIS — E1129 Type 2 diabetes mellitus with other diabetic kidney complication: Secondary | ICD-10-CM | POA: Diagnosis not present

## 2022-07-28 DIAGNOSIS — D631 Anemia in chronic kidney disease: Secondary | ICD-10-CM | POA: Diagnosis not present

## 2022-07-28 DIAGNOSIS — Z992 Dependence on renal dialysis: Secondary | ICD-10-CM | POA: Diagnosis not present

## 2022-07-28 DIAGNOSIS — N186 End stage renal disease: Secondary | ICD-10-CM | POA: Diagnosis not present

## 2022-07-28 DIAGNOSIS — D509 Iron deficiency anemia, unspecified: Secondary | ICD-10-CM | POA: Diagnosis not present

## 2022-07-28 DIAGNOSIS — E1129 Type 2 diabetes mellitus with other diabetic kidney complication: Secondary | ICD-10-CM | POA: Diagnosis not present

## 2022-07-28 DIAGNOSIS — I159 Secondary hypertension, unspecified: Secondary | ICD-10-CM | POA: Diagnosis not present

## 2022-07-31 DIAGNOSIS — Z992 Dependence on renal dialysis: Secondary | ICD-10-CM | POA: Diagnosis not present

## 2022-07-31 DIAGNOSIS — N186 End stage renal disease: Secondary | ICD-10-CM | POA: Diagnosis not present

## 2022-07-31 DIAGNOSIS — I159 Secondary hypertension, unspecified: Secondary | ICD-10-CM | POA: Diagnosis not present

## 2022-07-31 DIAGNOSIS — D509 Iron deficiency anemia, unspecified: Secondary | ICD-10-CM | POA: Diagnosis not present

## 2022-07-31 DIAGNOSIS — E1129 Type 2 diabetes mellitus with other diabetic kidney complication: Secondary | ICD-10-CM | POA: Diagnosis not present

## 2022-07-31 DIAGNOSIS — D631 Anemia in chronic kidney disease: Secondary | ICD-10-CM | POA: Diagnosis not present

## 2022-08-01 ENCOUNTER — Ambulatory Visit (HOSPITAL_COMMUNITY): Payer: Medicare Other | Admitting: Physical Therapy

## 2022-08-01 DIAGNOSIS — R42 Dizziness and giddiness: Secondary | ICD-10-CM | POA: Diagnosis not present

## 2022-08-01 NOTE — Therapy (Signed)
OUTPATIENT PHYSICAL THERAPY VESTIBULAR TREATMENT     Patient Name: Megan [name] MRN: [id number] DOB:27-Nov-1960, 61 y.o., female Today's Date: 08/01/2022  PHYSICAL THERAPY DISCHARGE SUMMARY  Visits from Start of Care: 3  Current functional level related to goals / functional outcomes: See below    Remaining deficits: See below    Education / Equipment: See assessment   Patient agrees to discharge. Patient goals were not met. Patient is being discharged due to lack of progress.  END OF SESSION:  PT End of Session - 08/01/22 1112     Visit Number 3    Number of Visits 8    Date for PT Re-Evaluation 08/01/22    Authorization Type Medicare Part A    Progress Note Due on Visit 10    PT Start Time 1113    PT Stop Time 1138    PT Time Calculation (min) 25 min    Activity Tolerance Patient tolerated treatment well    Behavior During Therapy WFL for tasks assessed/performed             Past Medical History:  Diagnosis Date   (HFpEF) heart failure with preserved ejection fraction ([place])    a. 01/2019 Echo: EF 55-60%, mild conc LVH. DD.  Torn MV chordae.   Anemia    Atypical chest pain    a. 08/2018 MV: EF 59%, no ischemia; b. 02/2019 Cath: nonobs dzs.   Blood transfusion without reported diagnosis    Breast cancer ([place]) 10/12/2020   Cataract    ESRD (end stage renal disease) on dialysis [facility])    a. HD T, T, S   Essential hypertension, benign    GERD (gastroesophageal reflux disease)    Headache    Hemorrhoids    Mixed hyperlipidemia    Morbid obesity (HCC)    Non-obstructive CAD (coronary artery disease)    a. 02/2019 CathL LM nl, LAD 35m LCX nl, RCA 25p, EF 55-65%.   PONV (postoperative nausea and vomiting)    S/P colonoscopy Jan 2011   Dr. 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    Type 2 diabetes mellitus ([place]    Wears glasses    Past Surgical History:  Procedure Laterality Date    ABDOMINAL HYSTERECTOMY     APPENDECTOMY     ARTERY BIOPSY N/A 05/09/2018   Procedure: RIGHT TEMPORAL ARTERY BIOPSY;  Surgeon: WJudeth Horn MD;  [facility]  Service: General;  Laterality: N/A;   BREAST BIOPSY Right 06/15/2020   Procedure: RIGHT BREAST BIOPSY;  Surgeon: JAviva Signs MD;  Location: [facility];  Service: General;  Laterality: Right;   CATARACT EXTRACTION W/PHACO Left 02/09/2017   Procedure: CATARACT EXTRACTION PHACO AND INTRAOCULAR LENS PLACEMENT LEFT EYE;  Surgeon: HTonny Branch MD;  Location: [facility];  Service: Ophthalmology;  Laterality: Left;  CDE: 4.89   CATARACT EXTRACTION W/PHACO Right 06/04/2017   Procedure: CATARACT EXTRACTION PHACO AND INTRAOCULAR LENS PLACEMENT (IOC);  Surgeon: HTonny Branch MD;  Location: [facility];  Service: Ophthalmology;  Laterality: Right;  CDE: 4.12   CHOLECYSTECTOMY  09/29/2011   Procedure: LAPAROSCOPIC CHOLECYSTECTOMY;  Surgeon: MJamesetta So MD;  Location: [facility];  Service: General;  Laterality: N/A;   COLONOSCOPY  08/2009   Dr. HBenson Norway sessile polyp (benign lymphoid), large hemorrhoids, repeat 5-10 years   COLONOSCOPY N/A 06/12/2016   prominent hemorrhoids   COLONOSCOPY WITH PROPOFOL N/A 06/16/2021   Procedure: COLONOSCOPY WITH PROPOFOL;  Surgeon: RDaneil Dolin  MD;  [facility];  Service: Endoscopy;  Laterality: N/A;  9:30am (dialysis pt)   ESOPHAGOGASTRODUODENOSCOPY  09/05/2011   IWL:NLGXQ hiatal hernia; remainder of exam normal. No explanation for patient's abdominal pain with today's examination   ESOPHAGOGASTRODUODENOSCOPY N/A 12/17/2013   Dr. [name]: gastric erythema, erosion, mild chronic inflammation on path    ESOPHAGOGASTRODUODENOSCOPY (EGD) WITH PROPOFOL N/A 06/16/2021   Procedure: ESOPHAGOGASTRODUODENOSCOPY (EGD) WITH PROPOFOL;  Surgeon: Daneil Dolin, MD;  [facility];  Service: Endoscopy;  Laterality: N/A;   EXCISION OF BREAST BIOPSY Right 10/12/2020   Procedure: EXCISION OF RIGHT BREAST BIOPSY;   Surgeon: Aviva Signs, MD;  Location: [facility];  Service: General;  Laterality: Right;   LAPAROSCOPIC APPENDECTOMY  09/29/2011   Procedure: APPENDECTOMY LAPAROSCOPIC;  Surgeon: Jamesetta So, MD;  Location: [facility];  Service: General;;  incidental appendectomy   LEFT HEART CATH AND CORONARY ANGIOGRAPHY N/A 02/28/2019   Procedure: LEFT HEART CATH AND CORONARY ANGIOGRAPHY;  Surgeon: Jettie Booze, MD;  Location: [place] [facility];  Service: Cardiovascular;  Laterality: N/A;   MASTECTOMY MODIFIED RADICAL Right 02/18/2020   Procedure: MASTECTOMY MODIFIED RADICAL;  Surgeon: Aviva Signs, MD;  Location: [facility];  Service: General;  Laterality: Right;   MASTECTOMY, PARTIAL Right 07/13/2020   Procedure: RIGHT PARTIAL MASTECTOMY;  Surgeon: Aviva Signs, MD;  Location: [facility];  Service: General;  Laterality: Right;   PARTIAL MASTECTOMY WITH NEEDLE LOCALIZATION AND AXILLARY SENTINEL LYMPH NODE BX Right 09/18/2018   Procedure: RIGHT PARTIAL MASTECTOMY AFTER NEEDLE LOCALIZATION, SENTINEL LYMPH NODE BIOPSY RIGHT AXILLA;  Surgeon: Aviva Signs, MD;  Location: [facility];  Service: General;  Laterality: Right;   POLYPECTOMY  06/16/2021   Procedure: POLYPECTOMY;  Surgeon: Daneil Dolin, MD;  [facility];  Service: Endoscopy;;   SIMPLE MASTECTOMY WITH AXILLARY SENTINEL NODE BIOPSY Left 06/15/2020   Procedure: LEFT SIMPLE MASTECTOMY;  Surgeon: Aviva Signs, MD;  Location: [facility];  Service: General;  Laterality: Left;   Patient Active Problem List   Diagnosis Date Noted   Dysuria 04/05/2022   Prolapsed internal hemorrhoids, grade 2 12/07/2021   LUQ pain 05/11/2021   Fatigue 05/03/2021   C. difficile colitis 02/15/2021   Dizziness    Breast mass, right    History of right breast cancer    Lobar pneumonia, unspecified organism ([place]) 07/03/2020   Pleural effusion, right    S/P left mastectomy 06/15/2020   Allergy, unspecified, initial encounter 03/03/2020   Anaphylactic shock, unspecified,  initial encounter 03/03/2020   Breast cancer ([place]) 02/18/2020   History of modified radical mastectomy of right breast 02/18/2020   S/P mastectomy, right 02/18/2020   ESRD (end stage renal disease) on dialysis ([place])    Grade III hemorrhoids 07/31/2019   Fluid overload, unspecified 05/24/2019   Anaphylactic reaction due to adverse effect of correct drug or medicament properly administered, initial encounter 04/30/2019   Unspecified protein-calorie malnutrition ([place]) 03/03/2019   Hypertensive heart disease with heart failure ([place]) 03/01/2019   Atherosclerotic heart disease of native coronary artery without angina pectoris 03/01/2019   Chest pain 02/28/2019   Chest pain with high risk for cardiac etiology 02/28/2019   Elevated troponin 02/03/2019   ESRD (end stage renal disease) ([place]) 11/94/1740   Acute diastolic CHF (congestive heart failure) ([place]) 02/03/2019   Hypokalemia 02/03/2019   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[place])    Hypocalcemia 09/03/2018   Hypothyroidism, unspecified 09/21/2017   Anemia in chronic kidney disease 04/12/2017  Iron deficiency anemia, unspecified 04/12/2017   Abnormal results of kidney function studies 04/11/2017   Acidosis 04/11/2017   Anemia, unspecified 04/11/2017   Coagulation defect, unspecified ([place]) 04/11/2017   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, with stage 5 chronic kidney disease, or end stage renal disease ([place]) 04/11/2017   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 04/11/2017   Other disorders of phosphorus metabolism 04/11/2017   Pain, unspecified 04/11/2017   Proteinuria, unspecified 04/11/2017   Pruritus, unspecified 04/11/2017   Unspecified kidney failure 04/11/2017   Encounter for prophylactic removal of breast 04/04/2017   Rectal bleeding 06/08/2016   Vaginal discharge 12/30/2015   Abdominal pain, epigastric 11/26/2013   GERD (gastroesophageal reflux  disease)    Type 2 diabetes mellitus ([place])    CKD (chronic kidney disease) stage 3, GFR 30-59 ml/min (HCC)    External hemorrhoid 08/01/2011   Hematochezia 07/13/2011   Dyspnea 04/21/2010   Atypical chest pain 04/21/2010   Mixed hyperlipidemia 03/11/2008   OBESITY 03/11/2008   Essential hypertension, benign 03/11/2008    PCP: [name], MD REFERRING PROVIDER: [name], MD  REFERRING DIAG: H81.10 (ICD-10-CM) - Benign paroxysmal positional vertigo, unspecified laterality  THERAPY DIAG:  Dizziness and giddiness - Plan: PT plan of care cert/re-cert  ONSET DATE: summer of this year  Rationale for Evaluation and Treatment: Rehabilitation  SUBJECTIVE:   SUBJECTIVE STATEMENT: Patient reports ongoing dizziness. She feels things are about the same. She reports ongoing issues with spontaneous dizziness, blurred vision and decreased balance. She continues to rely on her husband for support when walking because she feels she veers off to the side but there is no pattern or known cause. She feels that therapy exercises flare her dizziness up and makes her dizzy for the rest of the day. She has tried balance exercises from HEP but feels balance is no better.    Eval: Patient reports insidious onset of dizziness and vertigo; sometimes doing nothing at all and it comes on.  Sitting in the car or walking in the hallway or laying in the bed; husband holds her hand most of the time walking. Pt accompanied by: significant other  PERTINENT HISTORY: dialysis MWF; 6 years Hx of breast cancer Wants a kidney transplant  PAIN:  Are you having pain? Yes: NPRS scale: 0/10 Pain location: Headache Pain description: sore Aggravating factors: dialysis Relieving factors: rest  PRECAUTIONS: None  WEIGHT BEARING RESTRICTIONS: No  FALLS: Has patient fallen in last 6 months? No  LIVING ENVIRONMENT: Lives with: lives with their spouse Lives in: House/apartment Stairs: Yes: External: 1  steps; none Has following equipment at home: None  PLOF: Needs assistance with ADLs, Needs assistance with homemaking, and Needs assistance with gait  PATIENT GOALS: stop this dizziness  OBJECTIVE:   DIAGNOSTIC FINDINGS: none  COGNITION: Overall cognitive status: Within functional limits for tasks assessed   SENSATION: Some numbness in hands occassionally  EDEMA: mild swelling left arm    POSTURE:  rounded shoulders and forward head  Cervical ROM:  grossly wfl; no pain complaint  STRENGTH: grossly wfl bilateral upper extremity  LOWER EXTREMITY MMT:   MMT Right eval Left eval  Hip flexion    Hip abduction    Hip adduction    Hip internal rotation    Hip external rotation    Knee flexion    Knee extension    Ankle dorsiflexion    Ankle plantarflexion    Ankle inversion    Ankle  eversion    (Blank rows = not tested)  BED MOBILITY:  Sit to supine SBA  TRANSFERS: Assistive device utilized: None  Sit to stand: Modified independence Stand to sit: Modified independence Chair to chair: Modified independence  GAIT: Gait pattern:  holds her husbands hand, decreased arm swing- Right, decreased arm swing- Left, decreased step length- Right, and decreased step length- Left Distance walked: 75 ft Assistive device utilized: None Level of assistance: CGA Comments: holds her husbands hand  FUNCTIONAL TESTS:  5 times sit to stand: 19.95 sec  DGI 1. Gait level surface (2) Mild Impairment: Walks 20', uses assistive devices, slower speed, mild gait deviations. 2. Change in gait speed (2) Mild Impairment: Is able to change speed but demonstrates mild gait deviations, or not gait deviations but unable to achieve a significant change in velocity, or uses an assistive device. 3. Gait with horizontal head turns (2) Mild Impairment: Performs head turns smoothly with slight change in gait velocity, i.e., minor disruption to smooth gait path or uses walking aid. 4. Gait with  vertical head turns (2) Mild Impairment: Performs head turns smoothly with slight change in gait velocity, i.e., minor disruption to smooth gait path or uses walking aid. 5. Gait and pivot turn (2) Mild Impairment: Pivot turns safely in > 3 seconds and stops with no loss of balance. 6. Step over obstacle (2) Mild Impairment: Is able to step over box, but must slow down and adjust steps to clear box safely. 7. Step around obstacles (3) Normal: Is able to walk around cones safely without changing gait speed; no evidence of imbalance. 8. Stairs (2) Mild Impairment: Alternating feet, must use rail.  TOTAL SCORE: 46 / 24  PATIENT SURVEYS:  FOTO 37  VESTIBULAR ASSESSMENT:  GENERAL OBSERVATION: glasses   SYMPTOM BEHAVIOR:  Subjective history: dizziness spontaneous  Non-Vestibular symptoms:  none  Type of dizziness: Lightheadedness/Faint  Frequency: daily, multiple times  Duration: varies  Aggravating factors: Moving eyes  Relieving factors: no known relieving factors  Progression of symptoms: unchanged  OCULOMOTOR EXAM:  Ocular Alignment: normal  Ocular ROM: No Limitations  Spontaneous Nystagmus: absent  Gaze-Induced Nystagmus: absent but causes her to feel faint  Smooth Pursuits: intact  Saccades: intact  Convergence/Divergence:  cm    VESTIBULAR - OCULAR REFLEX:   Slow VOR: Normal  VOR Cancellation: Normal  Head-Impulse Test: HIT Right: negative HIT Left: negative  Dynamic Visual Acuity:  not assessed   POSITIONAL TESTING: Right Dix-Hallpike: no nystagmus Left Dix-Hallpike: no nystagmus  MOTION SENSITIVITY:  Motion Sensitivity Quotient Intensity: 0 = none, 1 = Lightheaded, 2 = Mild, 3 = Moderate, 4 = Severe, 5 = Vomiting  Intensity  1. Sitting to supine 0  2. Supine to L side   3. Supine to R side   4. Supine to sitting 2  5. L Hallpike-Dix 0  6. Up from L  2  7. R Hallpike-Dix 0  8. Up from R  2  9. Sitting, head tipped to L knee   10. Head up from L knee    11. Sitting, head tipped to R knee   12. Head up from R knee   13. Sitting head turns x5   14.Sitting head nods x5   15. In stance, 180 turn to L    16. In stance, 180 turn to R     OTHOSTATICS: not done  FUNCTIONAL GAIT: 5 times sit to stand: 19.95 sec   VESTIBULAR TREATMENT:  DATE:  08/01/22 Reassess/ DC  07/19/22 Seated horizontal gaze stabilization 2 x10 (mild dizzy) Seated vertical gaze stabilization 2 x 10 (mild dizzy)  DGI Tandem stance   07/04/22 Physical therapy evaluation and HEP instruction   Gaze Adaptation:  x1 Viewing Horizontal: Position: sitting and Reps: 10-20 and x1 Viewing Vertical:  Position: sitting and Reps: 10-20 Education details: Patient educated on exam findings, POC, scope of PT, HE. Person educated: Patient Education method: Explanation, Demonstration, and Handouts Education comprehension: verbalized understanding, returned demonstration, verbal cues required, and tactile cues required  HOME EXERCISE PROGRAM: Access Code: [facility] URL: [email or web address] 07/19/22 - Standing Tandem Balance with Counter Support  - 2-3 x daily - 7 x weekly - 1 sets - 3 reps - 15-20 second hold  Date: 07/04/2022 Prepared by: [facility] - Rehab  Exercises - Seated Gaze Stabilization with Head Rotation  - 3-5 x daily - 7 x weekly - 2 sets - 10 reps - Seated Gaze Stabilization with Head Nod  - 3-5 x daily - 7 x weekly - 2 sets - 10 reps GOALS: Goals reviewed with patient? No  SHORT TERM GOALS: Target date: 07/18/2022  Patient will be independent with initial HEP  Baseline: Reports varied compliance  Goal status: Partially MET  2.  Patient will report at least 50% improvement in overall symptoms and/or function to demonstrate improved functional mobility  Baseline: Reports 50% but "depends on the day" Goal status: MET  LONG TERM GOALS: Target  date: 08/01/2022  atient will be independent in self management strategies to improve quality of life and functional outcomes. Baseline:  Goal status: NOT MET  2.  Patient will be able to walk in the grocery store to shop without having to hold her husbands hand with minimal dizziness Baseline:  Goal status: NOT MET  3.  Patient will improve FOTO score to predicted value Baseline: 34% (was 37%) Goal status: NOT MET  4.  Patient will improve DGI score to at least 20/24 Baseline: 17/24 Goal status: Deferred per patient request (does not want to increase dizziness)    ASSESSMENT:  CLINICAL IMPRESSION: Patient returns after several week absence from therapy. She states her symptoms are not much better. Still having spontaneous dizziness, and feels that therapy exercises have worsened symptoms. At this time she would like to DC from therapy to return to PCP for possible referral to neurology. She also voices that at this time, her kidneys are her main concern but she knows that likely neuro consult will be the next step in regard to dizziness issues. Answered all patient questions and encouraged to follow up with therapy services with any further questions or concerns.    OBJECTIVE IMPAIRMENTS: decreased activity tolerance, decreased balance, decreased endurance, decreased knowledge of condition, decreased mobility, difficulty walking, dizziness, impaired perceived functional ability, and pain.   ACTIVITY LIMITATIONS: carrying, lifting, bending, sitting, standing, squatting, sleeping, stairs, transfers, bed mobility, dressing, hygiene/grooming, locomotion level, and caring for others  PARTICIPATION LIMITATIONS: meal prep, cleaning, laundry, driving, shopping, community activity, and church  PREHAB POTENTIAL: Good  CLINICAL DECISION MAKING: Evolving/moderate complexity  EVALUATION COMPLEXITY: Moderate   PLAN:  PT FREQUENCY: 2x/week  PT DURATION: 4 weeks  Therapeutic exercises,  Therapeutic activity, Neuromuscular re-education, Balance training, Gait training, Patient/Family education, Joint manipulation, Joint mobilization, Stair training, Orthotic/Fit training, DME instructions, Aquatic Therapy, Dry Needling, Electrical stimulation, Spinal manipulation, Spinal mobilization, Cryotherapy, Moist heat, Compression bandaging, scar mobilization, Splintting, Taping, Traction, Ultrasound, Ionotophoresis 53m/ml Dexamethasone, and Manual therapy. Vestibular habituation, vestibular rehab  techniques  PLAN FOR NEXT SESSION: DC to HEP   11:38 AM, 08/01/22 Josue Hector PT DPT  Physical Therapist with [facility]  [phone]

## 2022-08-02 DIAGNOSIS — D509 Iron deficiency anemia, unspecified: Secondary | ICD-10-CM | POA: Diagnosis not present

## 2022-08-02 DIAGNOSIS — N186 End stage renal disease: Secondary | ICD-10-CM | POA: Diagnosis not present

## 2022-08-02 DIAGNOSIS — Z992 Dependence on renal dialysis: Secondary | ICD-10-CM | POA: Diagnosis not present

## 2022-08-02 DIAGNOSIS — I159 Secondary hypertension, unspecified: Secondary | ICD-10-CM | POA: Diagnosis not present

## 2022-08-02 DIAGNOSIS — D631 Anemia in chronic kidney disease: Secondary | ICD-10-CM | POA: Diagnosis not present

## 2022-08-02 DIAGNOSIS — E1129 Type 2 diabetes mellitus with other diabetic kidney complication: Secondary | ICD-10-CM | POA: Diagnosis not present

## 2022-08-03 ENCOUNTER — Encounter (HOSPITAL_COMMUNITY): Payer: Medicare Other

## 2022-08-03 ENCOUNTER — Other Ambulatory Visit: Payer: Self-pay | Admitting: Family Medicine

## 2022-08-06 DIAGNOSIS — D509 Iron deficiency anemia, unspecified: Secondary | ICD-10-CM | POA: Diagnosis not present

## 2022-08-06 DIAGNOSIS — I159 Secondary hypertension, unspecified: Secondary | ICD-10-CM | POA: Diagnosis not present

## 2022-08-06 DIAGNOSIS — N186 End stage renal disease: Secondary | ICD-10-CM | POA: Diagnosis not present

## 2022-08-06 DIAGNOSIS — D631 Anemia in chronic kidney disease: Secondary | ICD-10-CM | POA: Diagnosis not present

## 2022-08-06 DIAGNOSIS — E1129 Type 2 diabetes mellitus with other diabetic kidney complication: Secondary | ICD-10-CM | POA: Diagnosis not present

## 2022-08-06 DIAGNOSIS — Z992 Dependence on renal dialysis: Secondary | ICD-10-CM | POA: Diagnosis not present

## 2022-08-09 DIAGNOSIS — E1129 Type 2 diabetes mellitus with other diabetic kidney complication: Secondary | ICD-10-CM | POA: Diagnosis not present

## 2022-08-09 DIAGNOSIS — Z992 Dependence on renal dialysis: Secondary | ICD-10-CM | POA: Diagnosis not present

## 2022-08-09 DIAGNOSIS — I159 Secondary hypertension, unspecified: Secondary | ICD-10-CM | POA: Diagnosis not present

## 2022-08-09 DIAGNOSIS — D631 Anemia in chronic kidney disease: Secondary | ICD-10-CM | POA: Diagnosis not present

## 2022-08-09 DIAGNOSIS — D509 Iron deficiency anemia, unspecified: Secondary | ICD-10-CM | POA: Diagnosis not present

## 2022-08-09 DIAGNOSIS — N186 End stage renal disease: Secondary | ICD-10-CM | POA: Diagnosis not present

## 2022-08-11 DIAGNOSIS — N186 End stage renal disease: Secondary | ICD-10-CM | POA: Diagnosis not present

## 2022-08-11 DIAGNOSIS — I159 Secondary hypertension, unspecified: Secondary | ICD-10-CM | POA: Diagnosis not present

## 2022-08-11 DIAGNOSIS — D631 Anemia in chronic kidney disease: Secondary | ICD-10-CM | POA: Diagnosis not present

## 2022-08-11 DIAGNOSIS — E1129 Type 2 diabetes mellitus with other diabetic kidney complication: Secondary | ICD-10-CM | POA: Diagnosis not present

## 2022-08-11 DIAGNOSIS — D509 Iron deficiency anemia, unspecified: Secondary | ICD-10-CM | POA: Diagnosis not present

## 2022-08-11 DIAGNOSIS — Z992 Dependence on renal dialysis: Secondary | ICD-10-CM | POA: Diagnosis not present

## 2022-08-13 DIAGNOSIS — E1129 Type 2 diabetes mellitus with other diabetic kidney complication: Secondary | ICD-10-CM | POA: Diagnosis not present

## 2022-08-13 DIAGNOSIS — Z992 Dependence on renal dialysis: Secondary | ICD-10-CM | POA: Diagnosis not present

## 2022-08-13 DIAGNOSIS — I159 Secondary hypertension, unspecified: Secondary | ICD-10-CM | POA: Diagnosis not present

## 2022-08-13 DIAGNOSIS — D509 Iron deficiency anemia, unspecified: Secondary | ICD-10-CM | POA: Diagnosis not present

## 2022-08-13 DIAGNOSIS — D631 Anemia in chronic kidney disease: Secondary | ICD-10-CM | POA: Diagnosis not present

## 2022-08-13 DIAGNOSIS — N186 End stage renal disease: Secondary | ICD-10-CM | POA: Diagnosis not present

## 2022-08-14 DIAGNOSIS — Z992 Dependence on renal dialysis: Secondary | ICD-10-CM | POA: Diagnosis not present

## 2022-08-14 DIAGNOSIS — N040 Nephrotic syndrome with minor glomerular abnormality: Secondary | ICD-10-CM | POA: Diagnosis not present

## 2022-08-14 DIAGNOSIS — N186 End stage renal disease: Secondary | ICD-10-CM | POA: Diagnosis not present

## 2022-08-15 ENCOUNTER — Emergency Department (HOSPITAL_COMMUNITY): Payer: Medicare Other

## 2022-08-15 ENCOUNTER — Encounter (HOSPITAL_COMMUNITY): Payer: Self-pay | Admitting: Emergency Medicine

## 2022-08-15 ENCOUNTER — Other Ambulatory Visit: Payer: Self-pay

## 2022-08-15 ENCOUNTER — Emergency Department (HOSPITAL_COMMUNITY)
Admission: EM | Admit: 2022-08-15 | Discharge: 2022-08-15 | Disposition: A | Discharge status: Home / Self Care | Payer: Medicare Other | Attending: Emergency Medicine | Admitting: Emergency Medicine

## 2022-08-15 ENCOUNTER — Other Ambulatory Visit: Payer: Self-pay | Admitting: Family Medicine

## 2022-08-15 DIAGNOSIS — Z7984 Long term (current) use of oral hypoglycemic drugs: Secondary | ICD-10-CM | POA: Insufficient documentation

## 2022-08-15 DIAGNOSIS — I5030 Unspecified diastolic (congestive) heart failure: Secondary | ICD-10-CM | POA: Diagnosis not present

## 2022-08-15 DIAGNOSIS — J986 Disorders of diaphragm: Secondary | ICD-10-CM | POA: Diagnosis not present

## 2022-08-15 DIAGNOSIS — I132 Hypertensive heart and chronic kidney disease with heart failure and with stage 5 chronic kidney disease, or end stage renal disease: Secondary | ICD-10-CM | POA: Insufficient documentation

## 2022-08-15 DIAGNOSIS — N186 End stage renal disease: Secondary | ICD-10-CM | POA: Diagnosis not present

## 2022-08-15 DIAGNOSIS — Z992 Dependence on renal dialysis: Secondary | ICD-10-CM | POA: Diagnosis not present

## 2022-08-15 DIAGNOSIS — Z7985 Long-term (current) use of injectable non-insulin antidiabetic drugs: Secondary | ICD-10-CM | POA: Diagnosis not present

## 2022-08-15 DIAGNOSIS — R0789 Other chest pain: Secondary | ICD-10-CM | POA: Diagnosis not present

## 2022-08-15 DIAGNOSIS — R079 Chest pain, unspecified: Secondary | ICD-10-CM

## 2022-08-15 DIAGNOSIS — J189 Pneumonia, unspecified organism: Secondary | ICD-10-CM

## 2022-08-15 DIAGNOSIS — J168 Pneumonia due to other specified infectious organisms: Secondary | ICD-10-CM | POA: Insufficient documentation

## 2022-08-15 DIAGNOSIS — E1122 Type 2 diabetes mellitus with diabetic chronic kidney disease: Secondary | ICD-10-CM | POA: Diagnosis not present

## 2022-08-15 DIAGNOSIS — I2510 Atherosclerotic heart disease of native coronary artery without angina pectoris: Secondary | ICD-10-CM | POA: Diagnosis not present

## 2022-08-15 DIAGNOSIS — Z1152 Encounter for screening for COVID-19: Secondary | ICD-10-CM | POA: Diagnosis not present

## 2022-08-15 DIAGNOSIS — Z853 Personal history of malignant neoplasm of breast: Secondary | ICD-10-CM | POA: Diagnosis not present

## 2022-08-15 DIAGNOSIS — J984 Other disorders of lung: Secondary | ICD-10-CM | POA: Diagnosis not present

## 2022-08-15 LAB — CBC
HCT: 32.2 % — ABNORMAL LOW (ref 36.0–46.0)
Hemoglobin: 10.6 g/dL — ABNORMAL LOW (ref 12.0–15.0)
MCH: 34.8 pg — ABNORMAL HIGH (ref 26.0–34.0)
MCHC: 32.9 g/dL (ref 30.0–36.0)
MCV: 105.6 fL — ABNORMAL HIGH (ref 80.0–100.0)
Platelets: 273 10*3/uL (ref 150–400)
RBC: 3.05 MIL/uL — ABNORMAL LOW (ref 3.87–5.11)
RDW: 14.4 % (ref 11.5–15.5)
WBC: 8.7 10*3/uL (ref 4.0–10.5)
nRBC: 0 % (ref 0.0–0.2)

## 2022-08-15 LAB — BASIC METABOLIC PANEL
Anion gap: 16 — ABNORMAL HIGH (ref 5–15)
BUN: 64 mg/dL — ABNORMAL HIGH (ref 6–20)
CO2: 26 mmol/L (ref 22–32)
Calcium: 9.4 mg/dL (ref 8.9–10.3)
Chloride: 97 mmol/L — ABNORMAL LOW (ref 98–111)
Creatinine, Ser: 12.42 mg/dL — ABNORMAL HIGH (ref 0.44–1.00)
GFR, Estimated: 3 mL/min — ABNORMAL LOW (ref >60)
Glucose, Bld: 110 mg/dL — ABNORMAL HIGH (ref 70–99)
Potassium: 3.6 mmol/L (ref 3.5–5.1)
Sodium: 139 mmol/L (ref 135–145)

## 2022-08-15 LAB — RESP PANEL BY RT-PCR (RSV, FLU A&B, COVID)  RVPGX2
Influenza A by PCR: NEGATIVE
Influenza B by PCR: NEGATIVE
Resp Syncytial Virus by PCR: NEGATIVE
SARS Coronavirus 2 by RT PCR: NEGATIVE

## 2022-08-15 LAB — TROPONIN I (HIGH SENSITIVITY)
Troponin I (High Sensitivity): 16 ng/L (ref <18)
Troponin I (High Sensitivity): 15 ng/L (ref <18)

## 2022-08-15 MED ORDER — ALUM & MAG HYDROXIDE-SIMETH 200-200-20 MG/5ML PO SUSP
30.0000 mL | Freq: Once | ORAL | Status: AC
  Administered 2022-08-15: 30 mL via ORAL
  Filled 2022-08-15: qty 30

## 2022-08-15 MED ORDER — ACETAMINOPHEN 500 MG PO TABS
1000.0000 mg | ORAL_TABLET | Freq: Once | ORAL | Status: AC
  Administered 2022-08-15: 1000 mg via ORAL
  Filled 2022-08-15: qty 2

## 2022-08-15 MED ORDER — NITROGLYCERIN 0.4 MG SL SUBL
0.4000 mg | SUBLINGUAL_TABLET | Freq: Once | SUBLINGUAL | Status: AC
  Administered 2022-08-15: 0.4 mg via SUBLINGUAL
  Filled 2022-08-15: qty 1

## 2022-08-15 MED ORDER — FAMOTIDINE 20 MG PO TABS
20.0000 mg | ORAL_TABLET | ORAL | Status: AC
  Administered 2022-08-15: 20 mg via ORAL
  Filled 2022-08-15: qty 1

## 2022-08-15 MED ORDER — AZITHROMYCIN 250 MG PO TABS: 250.0000 mg | ORAL_TABLET | Freq: Every day | ORAL | 0 refills | Status: DC

## 2022-08-15 MED ORDER — AMOXICILLIN-POT CLAVULANATE 875-125 MG PO TABS: 1.0000 | ORAL_TABLET | Freq: Two times a day (BID) | ORAL | 0 refills | Status: AC

## 2022-08-15 MED ORDER — LIDOCAINE VISCOUS HCL 2 % MT SOLN
15.0000 mL | Freq: Once | OROMUCOSAL | Status: AC
  Administered 2022-08-15: 15 mL via ORAL
  Filled 2022-08-15: qty 15

## 2022-08-15 NOTE — ED Triage Notes (Signed)
Pt c/o center chest pain that burns with radiation to throat, further endorses nausea and headache since this am

## 2022-08-15 NOTE — ED Provider Notes (Signed)
[facility] EMERGENCY DEPARTMENT Provider Note   CSN: [id number] Arrival date & time: 08/15/22  0941     History  Chief Complaint  Patient presents with   Chest Pain    Megan [name] is a 62 y.o. female.  62 year old female with a history of ESRD on Monday Wednesday Friday dialysis, heart failure with preserved ejection fraction, breast cancer in remission, GERD, hypertension, nonobstructive CAD, HLD, and diabetes who presents to the emergency department with chest discomfort.  At 530 this morning patient noticed chest discomfort that was a warm sensation radiating up her neck.  Says that she also has a foul taste in her mouth that feels like reflux.  Unsure if the chest discomfort is exertional or pleuritic.  Currently 7/10 in severity.  Says that she also feels generally weak at this time.  Has had nausea but no vomiting.  No diaphoresis.  No additional radiation of the pain.  No history of stents.  No lower extremity swelling or history of DVT/PE.  Not currently on blood thinners.  Has been compliant with her dialysis.  They report blood in her stool last week but none this week.  Does have a history of hemorrhoids and this is common for her.  Father had a heart attack.  Also with congestion and runny nose.  Did have some shortness of breath with the chest pain.  Denies any cough.       Home Medications Prior to Admission medications   Medication Sig Start Date End Date Taking? Authorizing Provider  amoxicillin-clavulanate (AUGMENTIN) 875-125 MG tablet Take 1 tablet by mouth every 12 (twelve) hours for 5 days. 08/15/22 08/20/22 Yes Fransico Meadow, MD  anastrozole (ARIMIDEX) 1 MG tablet Take 1 tablet (1 mg total) by mouth daily. 07/13/22  Yes Derek Jack, MD  aspirin EC 81 MG tablet Take 81 mg by mouth daily.   Yes [provider]  atorvastatin (LIPITOR) 20 MG tablet Take 1 tablet (20 mg total) by mouth daily. TAKE 1 TABLET DAILY 03/02/22  Yes [name],  MD  azithromycin (ZITHROMAX) 250 MG tablet Take 1 tablet (250 mg total) by mouth daily. Take first 2 tablets together, then 1 every day until finished. 08/15/22  Yes Fransico Meadow, MD  B Complex-C-Zn-Folic Acid (DIALYVITE 132-GMWN 15) 0.8 MG TABS Take 1 tablet by mouth daily. 06/19/22  Yes [provider]  cinacalcet (SENSIPAR) 30 MG tablet Take 2 tablets (60 mg total) by mouth daily. Patient taking differently: Take 30 mg by mouth daily. 06/13/18  Yes [name], MD  DULoxetine (CYMBALTA) 30 MG capsule TAKE 1 CAPSULE(30 MG) BY MOUTH DAILY Patient taking differently: Take 30 mg by mouth daily. 04/03/22  Yes [name], MD  gabapentin (NEURONTIN) 300 MG capsule TAKE 1 CAPSULE(300 MG) BY MOUTH AT BEDTIME 08/03/22  Yes [name], MD  HYDROcodone-acetaminophen (NORCO/VICODIN) 5-325 MG tablet Take 1 tablet by mouth every 6 (six) hours as needed. 06/20/22  Yes [name], MD  isosorbide mononitrate (IMDUR) 30 MG 24 hr tablet Take 1 tablet (30 mg total) by mouth daily. 04/13/22  Yes [name], MD  lanthanum (FOSRENOL) 1000 MG chewable tablet Chew 2,000-3,000 mg by mouth See admin instructions. Take 3 tablets (3000 mg) by mouth with meals and take 2 tablets (2000 mg) with snacks 10/16/19  Yes [provider]  lidocaine-prilocaine (EMLA) cream Apply 1 application  topically every Monday, Wednesday, and Friday with hemodialysis. 09/24/18  Yes [provider]  meclizine (ANTIVERT) 25 MG tablet TAKE 1 TABLET(25 MG) BY MOUTH THREE TIMES DAILY AS NEEDED FOR DIZZINESS Patient taking differently: Take 25 mg by mouth 3 (three) times daily as needed for dizziness or nausea. 03/21/22  Yes [name], MD  nebivolol (BYSTOLIC) 10 MG tablet Take 1 tablet (10 mg total) by mouth in the morning and at bedtime. 04/13/22  Yes [name], MD  pantoprazole (PROTONIX) 40 MG tablet TAKE 1 TABLET(40 MG) BY MOUTH DAILY Patient taking differently: Take 40 mg by  mouth daily. 01/20/22  Yes [name], MD  amLODipine (NORVASC) 10 MG tablet TAKE 1 TABLET BY MOUTH AT BEDTIME 08/15/22   [name], MD  Darbepoetin Alfa (ARANESP, ALBUMIN FREE, IJ) Darbepoetin Alfa (Aranesp) 06/23/22 06/22/23  [provider]      Allergies    Patient has no known allergies.    Review of Systems   Review of Systems  Physical Exam Updated Vital Signs BP 139/86 (BP Location: Right Arm)   Pulse 72   Temp 98.4 F (36.9 C) (Oral)   Resp 15   Ht '5\' 1"'$  (1.549 m)   Wt 81.2 kg   SpO2 95%   BMI 33.82 kg/m  Physical Exam Vitals and nursing note reviewed.  Constitutional:      General: She is not in acute distress.    Appearance: She is well-developed.  HENT:     Head: Normocephalic and atraumatic.     Right Ear: External ear normal.     Left Ear: External ear normal.     Nose: Nose normal.  Eyes:     Extraocular Movements: Extraocular movements intact.     Conjunctiva/sclera: Conjunctivae normal.     Pupils: Pupils are equal, round, and reactive to light.  Cardiovascular:     Rate and Rhythm: Normal rate and regular rhythm.     Heart sounds: No murmur heard.    Comments: Fistula in left upper extremity with thrill and bruit Pulmonary:     Effort: Pulmonary effort is normal. No respiratory distress.     Breath sounds: Normal breath sounds.  Musculoskeletal:     Cervical back: Normal range of motion and neck supple.     Right lower leg: No edema.     Left lower leg: No edema.  Skin:    General: Skin is warm and dry.  Neurological:     Mental Status: She is alert and oriented to person, place, and time. Mental status is at baseline.  Psychiatric:        Mood and Affect: Mood normal.     ED Results / Procedures / Treatments   Labs (all labs ordered are listed, but only abnormal results are displayed) Labs Reviewed  BASIC METABOLIC PANEL - Abnormal; Notable for the following components:      Result Value   Chloride 97 (*)     Glucose, Bld 110 (*)    BUN 64 (*)    Creatinine, Ser 12.42 (*)    GFR, Estimated 3 (*)    Anion gap 16 (*)    All other components within normal limits  CBC - Abnormal; Notable for the following components:   RBC 3.05 (*)    Hemoglobin 10.6 (*)    HCT 32.2 (*)    MCV 105.6 (*)    MCH 34.8 (*)    All other components within normal limits  RESP PANEL BY RT-PCR (RSV, FLU A&B, COVID)  RVPGX2  TROPONIN I (HIGH SENSITIVITY)  TROPONIN I (HIGH SENSITIVITY)    EKG EKG Interpretation  Date/Time:  Tuesday August 15 2022 14:23:37 EST Ventricular Rate:  76 PR Interval:  174 QRS Duration: 92 QT Interval:  468 QTC Calculation: 527 R Axis:   4 Text Interpretation: Sinus rhythm Probable left atrial enlargement Probable LVH with secondary repol abnrm Prolonged QT interval Confirmed by Margaretmary Eddy [phone]) on 08/15/2022 3:24:39 PM  Radiology DG Chest 2 View  Result Date: 08/15/2022 CLINICAL DATA:  62 year old female presents for evaluation of chest pain. EXAM: CHEST - 2 VIEW COMPARISON:  February 26, 2022. FINDINGS: EKG leads project over the chest. Cardiomediastinal contours and hilar structures are stable. Lungs are clear aside from minimal LEFT lower lobe airspace disease just over the LEFT hemidiaphragm. No lobar consolidation or pneumothorax. No sign of pleural effusion. Osteopenia without acute skeletal process on limited evaluation. IMPRESSION: Minimal LEFT lower lobe airspace disease just over the LEFT hemidiaphragm, likely atelectasis, difficult to exclude infection. Electronically Signed   By: Zetta Bills M.D.   On: 08/15/2022 10:39    Procedures Procedures   Medications Ordered in ED Medications  acetaminophen (TYLENOL) tablet 1,000 mg (1,000 mg Oral Given 08/15/22 1109)  alum & mag hydroxide-simeth (MAALOX/MYLANTA) 200-200-20 MG/5ML suspension 30 mL (30 mLs Oral Given 08/15/22 1110)    And  lidocaine (XYLOCAINE) 2 % viscous mouth solution 15 mL (15 mLs Oral Given 08/15/22 1110)   famotidine (PEPCID) tablet 20 mg (20 mg Oral Given 08/15/22 1109)  nitroGLYCERIN (NITROSTAT) SL tablet 0.4 mg (0.4 mg Sublingual Given 08/15/22 1243)    ED Course/ Medical Decision Making/ A&P                           Medical Decision Making Amount and/or Complexity of Data Reviewed Labs: ordered. Radiology: ordered.  Risk OTC drugs. Prescription drug management.   ONEDA DUFFETT is a 62 y.o. female with comorbidities that complicate the patient evaluation including ESRD on Monday Wednesday Friday dialysis, heart failure with preserved ejection fraction, breast cancer in remission, GERD, hypertension, nonobstructive CAD, HLD, and diabetes who presents to the emergency department with chest discomfort.    Initial Ddx:  GERD, MI, URI, pneumonia, PE  MDM:  Feel the patient likely is having either reflux or MI given her risk factors and description of the pain.  Could potentially have a URI as well with her congestion.  No no cough that would suggest a pneumonia at this time.  Considered PE but feel that is less likely given the description of her pain and the fact that is not pleuritic and is not having any cough or signs or symptoms of DVT at this time.  Plan:  Labs Troponin Chest x-ray COVID/flu EKG GI cocktail  ED Summary/Re-evaluation:  Patient underwent the following workup which was reassuring.  Upon reevaluation had improvement of her chest discomfort after her GI cocktail and Pepcid and Tylenol.  Reported that it was still persisting and was given nitroglycerin which did not significantly change her pain.  Calculated the patient's heart score to be 5.  Discussed inpatient management and observation with possible stress test and outpatient follow-up with cardiology with the patient and her husband.  They preferred to follow-up with cardiology as an outpatient.  Ambulatory referral to cardiology was placed.  Also instructed them to follow-up with her primary doctor in 2 to 3  days.  Chest x-ray did show atelectasis versus possible pneumonia versus atelectasis so patient was given  antibiotics to go home with.  This patient presents to the ED for concern of complaints listed in HPI, this involves an extensive number of treatment options, and is a complaint that carries with it a high risk of complications and morbidity. Disposition including potential need for admission considered.   Dispo: DC Home. Return precautions discussed including, but not limited to, those listed in the AVS. Allowed pt time to ask questions which were answered fully prior to dc.  Additional history obtained from spouse Records reviewed Outpatient Clinic Notes The following labs were independently interpreted: Chemistry and CBC and show chronic anemia and CKD I independently reviewed the following imaging with scope of interpretation limited to determining acute life threatening conditions related to emergency care: Chest x-ray and agree with the radiologist interpretation with the following exceptions: None I personally reviewed and interpreted cardiac monitoring: normal sinus rhythm  I personally reviewed and interpreted the pt's EKG: see above for interpretation  I have reviewed the patients home medications and made adjustments as needed  Final Clinical Impression(s) / ED Diagnoses Final diagnoses:  Chest pain, unspecified type  Pneumonia of left lower lobe due to infectious organism    Rx / DC Orders ED Discharge Orders          Ordered    Ambulatory referral to Cardiology        08/15/22 1556    amoxicillin-clavulanate (AUGMENTIN) 875-125 MG tablet  Every 12 hours        08/15/22 1600    azithromycin (ZITHROMAX) 250 MG tablet  Daily        08/15/22 1600              Fransico Meadow, MD 08/15/22 [phone]

## 2022-08-15 NOTE — Discharge Instructions (Addendum)
You were seen for chest pain in the emergency department.  Cardiology will call you about follow-up.  If you do not hear from them within 72 hours please give them a call.  At home, please take the antibiotics we have prescribed you for pneumonia.    Follow-up with your primary doctor in 2-3 days regarding your visit.    Return immediately to the emergency department if you experience any of the following: Worsening chest pain, fainting, high fevers, difficulty breathing, or any other concerning symptoms.    Thank you for visiting our Emergency Department. It was a pleasure taking care of you today.

## 2022-08-16 DIAGNOSIS — N2581 Secondary hyperparathyroidism of renal origin: Secondary | ICD-10-CM | POA: Diagnosis not present

## 2022-08-16 DIAGNOSIS — N186 End stage renal disease: Secondary | ICD-10-CM | POA: Diagnosis not present

## 2022-08-16 DIAGNOSIS — Z992 Dependence on renal dialysis: Secondary | ICD-10-CM | POA: Diagnosis not present

## 2022-08-16 DIAGNOSIS — D631 Anemia in chronic kidney disease: Secondary | ICD-10-CM | POA: Diagnosis not present

## 2022-08-16 DIAGNOSIS — I159 Secondary hypertension, unspecified: Secondary | ICD-10-CM | POA: Diagnosis not present

## 2022-08-16 DIAGNOSIS — E119 Type 2 diabetes mellitus without complications: Secondary | ICD-10-CM | POA: Diagnosis not present

## 2022-08-17 ENCOUNTER — Telehealth: Payer: Self-pay

## 2022-08-17 NOTE — Telephone Encounter (Signed)
        Patient  visited [place] on 1/2    Telephone encounter attempt : 1st   A HIPAA compliant voice message was left requesting a return call.  Instructed patient to call back.    [place], Care Management  [phone], [place],  [address] Phone: [phone] Email: [email or web address]

## 2022-08-18 ENCOUNTER — Telehealth: Payer: Self-pay

## 2022-08-18 DIAGNOSIS — I159 Secondary hypertension, unspecified: Secondary | ICD-10-CM | POA: Diagnosis not present

## 2022-08-18 DIAGNOSIS — D631 Anemia in chronic kidney disease: Secondary | ICD-10-CM | POA: Diagnosis not present

## 2022-08-18 DIAGNOSIS — Z992 Dependence on renal dialysis: Secondary | ICD-10-CM | POA: Diagnosis not present

## 2022-08-18 DIAGNOSIS — N2581 Secondary hyperparathyroidism of renal origin: Secondary | ICD-10-CM | POA: Diagnosis not present

## 2022-08-18 DIAGNOSIS — N186 End stage renal disease: Secondary | ICD-10-CM | POA: Diagnosis not present

## 2022-08-18 DIAGNOSIS — E119 Type 2 diabetes mellitus without complications: Secondary | ICD-10-CM | POA: Diagnosis not present

## 2022-08-18 NOTE — Telephone Encounter (Signed)
        Patient  visited [place] on 1/2   Telephone encounter attempt :  2nd  A HIPAA compliant voice message was left requesting a return call.  Instructed patient to call back.    [place], Care Management  [phone] [address], [place], [place] [address] Phone: [phone] Email: [email or web address]

## 2022-08-21 DIAGNOSIS — N2581 Secondary hyperparathyroidism of renal origin: Secondary | ICD-10-CM | POA: Diagnosis not present

## 2022-08-21 DIAGNOSIS — D631 Anemia in chronic kidney disease: Secondary | ICD-10-CM | POA: Diagnosis not present

## 2022-08-21 DIAGNOSIS — I159 Secondary hypertension, unspecified: Secondary | ICD-10-CM | POA: Diagnosis not present

## 2022-08-21 DIAGNOSIS — E119 Type 2 diabetes mellitus without complications: Secondary | ICD-10-CM | POA: Diagnosis not present

## 2022-08-21 DIAGNOSIS — Z992 Dependence on renal dialysis: Secondary | ICD-10-CM | POA: Diagnosis not present

## 2022-08-21 DIAGNOSIS — N186 End stage renal disease: Secondary | ICD-10-CM | POA: Diagnosis not present

## 2022-08-23 DIAGNOSIS — N2581 Secondary hyperparathyroidism of renal origin: Secondary | ICD-10-CM | POA: Diagnosis not present

## 2022-08-23 DIAGNOSIS — I159 Secondary hypertension, unspecified: Secondary | ICD-10-CM | POA: Diagnosis not present

## 2022-08-23 DIAGNOSIS — N186 End stage renal disease: Secondary | ICD-10-CM | POA: Diagnosis not present

## 2022-08-23 DIAGNOSIS — D631 Anemia in chronic kidney disease: Secondary | ICD-10-CM | POA: Diagnosis not present

## 2022-08-23 DIAGNOSIS — E119 Type 2 diabetes mellitus without complications: Secondary | ICD-10-CM | POA: Diagnosis not present

## 2022-08-23 DIAGNOSIS — Z992 Dependence on renal dialysis: Secondary | ICD-10-CM | POA: Diagnosis not present

## 2022-08-25 DIAGNOSIS — D631 Anemia in chronic kidney disease: Secondary | ICD-10-CM | POA: Diagnosis not present

## 2022-08-25 DIAGNOSIS — N2581 Secondary hyperparathyroidism of renal origin: Secondary | ICD-10-CM | POA: Diagnosis not present

## 2022-08-25 DIAGNOSIS — Z992 Dependence on renal dialysis: Secondary | ICD-10-CM | POA: Diagnosis not present

## 2022-08-25 DIAGNOSIS — E119 Type 2 diabetes mellitus without complications: Secondary | ICD-10-CM | POA: Diagnosis not present

## 2022-08-25 DIAGNOSIS — N186 End stage renal disease: Secondary | ICD-10-CM | POA: Diagnosis not present

## 2022-08-25 DIAGNOSIS — I159 Secondary hypertension, unspecified: Secondary | ICD-10-CM | POA: Diagnosis not present

## 2022-08-28 DIAGNOSIS — E119 Type 2 diabetes mellitus without complications: Secondary | ICD-10-CM | POA: Diagnosis not present

## 2022-08-28 DIAGNOSIS — D631 Anemia in chronic kidney disease: Secondary | ICD-10-CM | POA: Diagnosis not present

## 2022-08-28 DIAGNOSIS — N186 End stage renal disease: Secondary | ICD-10-CM | POA: Diagnosis not present

## 2022-08-28 DIAGNOSIS — Z992 Dependence on renal dialysis: Secondary | ICD-10-CM | POA: Diagnosis not present

## 2022-08-28 DIAGNOSIS — N2581 Secondary hyperparathyroidism of renal origin: Secondary | ICD-10-CM | POA: Diagnosis not present

## 2022-08-28 DIAGNOSIS — I159 Secondary hypertension, unspecified: Secondary | ICD-10-CM | POA: Diagnosis not present

## 2022-08-30 DIAGNOSIS — N2581 Secondary hyperparathyroidism of renal origin: Secondary | ICD-10-CM | POA: Diagnosis not present

## 2022-08-30 DIAGNOSIS — D631 Anemia in chronic kidney disease: Secondary | ICD-10-CM | POA: Diagnosis not present

## 2022-08-30 DIAGNOSIS — Z992 Dependence on renal dialysis: Secondary | ICD-10-CM | POA: Diagnosis not present

## 2022-08-30 DIAGNOSIS — I159 Secondary hypertension, unspecified: Secondary | ICD-10-CM | POA: Diagnosis not present

## 2022-08-30 DIAGNOSIS — E119 Type 2 diabetes mellitus without complications: Secondary | ICD-10-CM | POA: Diagnosis not present

## 2022-08-30 DIAGNOSIS — N186 End stage renal disease: Secondary | ICD-10-CM | POA: Diagnosis not present

## 2022-09-01 DIAGNOSIS — N2581 Secondary hyperparathyroidism of renal origin: Secondary | ICD-10-CM | POA: Diagnosis not present

## 2022-09-01 DIAGNOSIS — D631 Anemia in chronic kidney disease: Secondary | ICD-10-CM | POA: Diagnosis not present

## 2022-09-01 DIAGNOSIS — E119 Type 2 diabetes mellitus without complications: Secondary | ICD-10-CM | POA: Diagnosis not present

## 2022-09-01 DIAGNOSIS — I159 Secondary hypertension, unspecified: Secondary | ICD-10-CM | POA: Diagnosis not present

## 2022-09-01 DIAGNOSIS — Z992 Dependence on renal dialysis: Secondary | ICD-10-CM | POA: Diagnosis not present

## 2022-09-01 DIAGNOSIS — N186 End stage renal disease: Secondary | ICD-10-CM | POA: Diagnosis not present

## 2022-09-04 ENCOUNTER — Other Ambulatory Visit: Payer: Self-pay | Admitting: Family Medicine

## 2022-09-04 DIAGNOSIS — E119 Type 2 diabetes mellitus without complications: Secondary | ICD-10-CM | POA: Diagnosis not present

## 2022-09-04 DIAGNOSIS — I159 Secondary hypertension, unspecified: Secondary | ICD-10-CM | POA: Diagnosis not present

## 2022-09-04 DIAGNOSIS — Z992 Dependence on renal dialysis: Secondary | ICD-10-CM | POA: Diagnosis not present

## 2022-09-04 DIAGNOSIS — D631 Anemia in chronic kidney disease: Secondary | ICD-10-CM | POA: Diagnosis not present

## 2022-09-04 DIAGNOSIS — N2581 Secondary hyperparathyroidism of renal origin: Secondary | ICD-10-CM | POA: Diagnosis not present

## 2022-09-04 DIAGNOSIS — N186 End stage renal disease: Secondary | ICD-10-CM | POA: Diagnosis not present

## 2022-09-06 DIAGNOSIS — E119 Type 2 diabetes mellitus without complications: Secondary | ICD-10-CM | POA: Diagnosis not present

## 2022-09-06 DIAGNOSIS — I159 Secondary hypertension, unspecified: Secondary | ICD-10-CM | POA: Diagnosis not present

## 2022-09-06 DIAGNOSIS — Z992 Dependence on renal dialysis: Secondary | ICD-10-CM | POA: Diagnosis not present

## 2022-09-06 DIAGNOSIS — N186 End stage renal disease: Secondary | ICD-10-CM | POA: Diagnosis not present

## 2022-09-06 DIAGNOSIS — N2581 Secondary hyperparathyroidism of renal origin: Secondary | ICD-10-CM | POA: Diagnosis not present

## 2022-09-06 DIAGNOSIS — D631 Anemia in chronic kidney disease: Secondary | ICD-10-CM | POA: Diagnosis not present

## 2022-09-08 DIAGNOSIS — I159 Secondary hypertension, unspecified: Secondary | ICD-10-CM | POA: Diagnosis not present

## 2022-09-08 DIAGNOSIS — N186 End stage renal disease: Secondary | ICD-10-CM | POA: Diagnosis not present

## 2022-09-08 DIAGNOSIS — N2581 Secondary hyperparathyroidism of renal origin: Secondary | ICD-10-CM | POA: Diagnosis not present

## 2022-09-08 DIAGNOSIS — Z992 Dependence on renal dialysis: Secondary | ICD-10-CM | POA: Diagnosis not present

## 2022-09-08 DIAGNOSIS — E119 Type 2 diabetes mellitus without complications: Secondary | ICD-10-CM | POA: Diagnosis not present

## 2022-09-08 DIAGNOSIS — D631 Anemia in chronic kidney disease: Secondary | ICD-10-CM | POA: Diagnosis not present

## 2022-09-11 DIAGNOSIS — Z992 Dependence on renal dialysis: Secondary | ICD-10-CM | POA: Diagnosis not present

## 2022-09-11 DIAGNOSIS — I159 Secondary hypertension, unspecified: Secondary | ICD-10-CM | POA: Diagnosis not present

## 2022-09-11 DIAGNOSIS — E119 Type 2 diabetes mellitus without complications: Secondary | ICD-10-CM | POA: Diagnosis not present

## 2022-09-11 DIAGNOSIS — N2581 Secondary hyperparathyroidism of renal origin: Secondary | ICD-10-CM | POA: Diagnosis not present

## 2022-09-11 DIAGNOSIS — N186 End stage renal disease: Secondary | ICD-10-CM | POA: Diagnosis not present

## 2022-09-11 DIAGNOSIS — D631 Anemia in chronic kidney disease: Secondary | ICD-10-CM | POA: Diagnosis not present

## 2022-09-12 ENCOUNTER — Other Ambulatory Visit: Payer: Self-pay

## 2022-09-12 ENCOUNTER — Emergency Department (HOSPITAL_COMMUNITY): Payer: Medicare Other

## 2022-09-12 ENCOUNTER — Emergency Department (HOSPITAL_COMMUNITY)
Admission: EM | Admit: 2022-09-12 | Discharge: 2022-09-12 | Disposition: A | Discharge status: Home / Self Care | Payer: Medicare Other | Attending: Emergency Medicine | Admitting: Emergency Medicine

## 2022-09-12 ENCOUNTER — Encounter (HOSPITAL_COMMUNITY): Payer: Self-pay | Admitting: *Deleted

## 2022-09-12 ENCOUNTER — Ambulatory Visit: Payer: Medicare Other | Admitting: Family Medicine

## 2022-09-12 DIAGNOSIS — R0602 Shortness of breath: Secondary | ICD-10-CM | POA: Diagnosis not present

## 2022-09-12 DIAGNOSIS — D649 Anemia, unspecified: Secondary | ICD-10-CM | POA: Insufficient documentation

## 2022-09-12 DIAGNOSIS — E1122 Type 2 diabetes mellitus with diabetic chronic kidney disease: Secondary | ICD-10-CM | POA: Insufficient documentation

## 2022-09-12 DIAGNOSIS — E669 Obesity, unspecified: Secondary | ICD-10-CM | POA: Diagnosis not present

## 2022-09-12 DIAGNOSIS — Z7982 Long term (current) use of aspirin: Secondary | ICD-10-CM | POA: Insufficient documentation

## 2022-09-12 DIAGNOSIS — Z79899 Other long term (current) drug therapy: Secondary | ICD-10-CM | POA: Diagnosis not present

## 2022-09-12 DIAGNOSIS — N186 End stage renal disease: Secondary | ICD-10-CM | POA: Insufficient documentation

## 2022-09-12 DIAGNOSIS — I120 Hypertensive chronic kidney disease with stage 5 chronic kidney disease or end stage renal disease: Secondary | ICD-10-CM | POA: Diagnosis not present

## 2022-09-12 DIAGNOSIS — E039 Hypothyroidism, unspecified: Secondary | ICD-10-CM | POA: Insufficient documentation

## 2022-09-12 DIAGNOSIS — R079 Chest pain, unspecified: Secondary | ICD-10-CM

## 2022-09-12 DIAGNOSIS — R0789 Other chest pain: Secondary | ICD-10-CM | POA: Diagnosis not present

## 2022-09-12 DIAGNOSIS — I10 Essential (primary) hypertension: Secondary | ICD-10-CM | POA: Diagnosis not present

## 2022-09-12 DIAGNOSIS — R52 Pain, unspecified: Secondary | ICD-10-CM | POA: Diagnosis not present

## 2022-09-12 DIAGNOSIS — Z992 Dependence on renal dialysis: Secondary | ICD-10-CM | POA: Diagnosis not present

## 2022-09-12 DIAGNOSIS — Z6833 Body mass index (BMI) 33.0-33.9, adult: Secondary | ICD-10-CM | POA: Diagnosis not present

## 2022-09-12 LAB — CBC WITH DIFFERENTIAL/PLATELET
Abs Immature Granulocytes: 0.03 10*3/uL (ref 0.00–0.07)
Basophils Absolute: 0.1 10*3/uL (ref 0.0–0.1)
Basophils Relative: 1 %
Eosinophils Absolute: 0.4 10*3/uL (ref 0.0–0.5)
Eosinophils Relative: 5 %
HCT: 34.6 % — ABNORMAL LOW (ref 36.0–46.0)
Hemoglobin: 11.2 g/dL — ABNORMAL LOW (ref 12.0–15.0)
Immature Granulocytes: 0 %
Lymphocytes Relative: 25 %
Lymphs Abs: 1.9 10*3/uL (ref 0.7–4.0)
MCH: 34 pg (ref 26.0–34.0)
MCHC: 32.4 g/dL (ref 30.0–36.0)
MCV: 105.2 fL — ABNORMAL HIGH (ref 80.0–100.0)
Monocytes Absolute: 0.8 10*3/uL (ref 0.1–1.0)
Monocytes Relative: 10 %
Neutro Abs: 4.5 10*3/uL (ref 1.7–7.7)
Neutrophils Relative %: 59 %
Platelets: 269 10*3/uL (ref 150–400)
RBC: 3.29 MIL/uL — ABNORMAL LOW (ref 3.87–5.11)
RDW: 15 % (ref 11.5–15.5)
WBC: 7.6 10*3/uL (ref 4.0–10.5)
nRBC: 0 % (ref 0.0–0.2)

## 2022-09-12 LAB — BASIC METABOLIC PANEL
Anion gap: 15 (ref 5–15)
BUN: 46 mg/dL — ABNORMAL HIGH (ref 6–20)
CO2: 27 mmol/L (ref 22–32)
Calcium: 9.8 mg/dL (ref 8.9–10.3)
Chloride: 96 mmol/L — ABNORMAL LOW (ref 98–111)
Creatinine, Ser: 10.35 mg/dL — ABNORMAL HIGH (ref 0.44–1.00)
GFR, Estimated: 4 mL/min — ABNORMAL LOW (ref >60)
Glucose, Bld: 96 mg/dL (ref 70–99)
Potassium: 3.8 mmol/L (ref 3.5–5.1)
Sodium: 138 mmol/L (ref 135–145)

## 2022-09-12 LAB — HEPATIC FUNCTION PANEL
ALT: 27 U/L (ref 0–44)
AST: 26 U/L (ref 15–41)
Albumin: 3.5 g/dL (ref 3.5–5.0)
Alkaline Phosphatase: 90 U/L (ref 38–126)
Bilirubin, Direct: 0.1 mg/dL (ref 0.0–0.2)
Indirect Bilirubin: 0.6 mg/dL (ref 0.3–0.9)
Total Bilirubin: 0.7 mg/dL (ref 0.3–1.2)
Total Protein: 7.3 g/dL (ref 6.5–8.1)

## 2022-09-12 LAB — TROPONIN I (HIGH SENSITIVITY)
Troponin I (High Sensitivity): 13 ng/L (ref <18)
Troponin I (High Sensitivity): 12 ng/L (ref <18)

## 2022-09-12 LAB — MAGNESIUM: Magnesium: 2.5 mg/dL — ABNORMAL HIGH (ref 1.7–2.4)

## 2022-09-12 LAB — BRAIN NATRIURETIC PEPTIDE: B Natriuretic Peptide: 130 pg/mL — ABNORMAL HIGH (ref 0.0–100.0)

## 2022-09-12 LAB — LIPASE, BLOOD: Lipase: 48 U/L (ref 11–51)

## 2022-09-12 MED ORDER — FENTANYL CITRATE PF 50 MCG/ML IJ SOSY
50.0000 ug | PREFILLED_SYRINGE | Freq: Once | INTRAMUSCULAR | Status: AC
  Administered 2022-09-12: 50 ug via INTRAVENOUS
  Filled 2022-09-12: qty 1

## 2022-09-12 MED ORDER — ASPIRIN 81 MG PO CHEW
324.0000 mg | CHEWABLE_TABLET | Freq: Once | ORAL | Status: AC
  Administered 2022-09-12: 243 mg via ORAL
  Filled 2022-09-12: qty 4

## 2022-09-12 MED ORDER — SUCRALFATE 1 GM/10ML PO SUSP: 1.0000 g | Freq: Three times a day (TID) | ORAL | 0 refills | Status: DC

## 2022-09-12 MED ORDER — ALUM & MAG HYDROXIDE-SIMETH 200-200-20 MG/5ML PO SUSP
30.0000 mL | Freq: Once | ORAL | Status: AC
  Administered 2022-09-12: 30 mL via ORAL
  Filled 2022-09-12: qty 30

## 2022-09-12 MED ORDER — LIDOCAINE VISCOUS HCL 2 % MT SOLN
15.0000 mL | Freq: Once | OROMUCOSAL | Status: AC
  Administered 2022-09-12: 15 mL via ORAL
  Filled 2022-09-12: qty 15

## 2022-09-12 MED ORDER — FAMOTIDINE IN NACL 20-0.9 MG/50ML-% IV SOLN
20.0000 mg | Freq: Once | INTRAVENOUS | Status: AC
  Administered 2022-09-12: 20 mg via INTRAVENOUS
  Filled 2022-09-12: qty 50

## 2022-09-12 NOTE — Discharge Instructions (Signed)
Continue your daily Protonix.  Another medication was sent to your pharmacy called sucralfate.  This is a medication that can help symptoms of gastritis and reflux.  This is taken with meals.  If you feel like it helps, talk to your primary care doctor about long-term use.  Keep your appointment with Dr. [name] tomorrow to further discuss your recent symptoms.  Return to the emergency department for any new or worsening symptoms of concern.

## 2022-09-12 NOTE — ED Triage Notes (Signed)
HD pt who has treatments MWF and had her last treatment yesterday is here for CP and sob.  Pt has had intermittent CP and sob for a week.  She went to [facility] and was sent here by ems.

## 2022-09-12 NOTE — ED Notes (Signed)
Patient advised that she took 81 mg of ASA this AM.  Per MD Dixon, give 3 chewable asa instead of 4.

## 2022-09-12 NOTE — ED Provider Notes (Signed)
[facility] Provider Note   CSN: [id number] Arrival date & time: 09/12/22  1050     History  Chief Complaint  Patient presents with   Shortness of Breath   Chest Pain    Megan [name] is a 62 y.o. female.   Shortness of Breath Associated symptoms: chest pain   Patient presents for intermittent chest pain and shortness of breath over the past week.  Medical history includes ESRD, HLD, HTN, CKD, GERD, T2DM, hemorrhoids, hypothyroidism, anemia.  She has been adherent to her dialysis schedule.  She did a full session yesterday.  She has not noticed any pattern to the symptoms.  She does not feel that they are worsened postprandially.  She is not aware of any worsening with exertion, although she states that she does not exert herself.  She arrives via EMS.  EMS noted normal vital signs during transit.  Patient describes her chest pain as lower substernal area.  It does not radiate.  She feels like she has a difficult time taking a full breath.  She feels like her pain worsens on expiration.  She denies any other recent symptoms.     Home Medications Prior to Admission medications   Medication Sig Start Date End Date Taking? Authorizing Provider  sucralfate (CARAFATE) 1 GM/10ML suspension Take 10 mLs (1 g total) by mouth with breakfast, with lunch, and with evening meal. 09/12/22  Yes Godfrey Pick, MD  amLODipine (NORVASC) 10 MG tablet TAKE 1 TABLET BY MOUTH AT BEDTIME 08/15/22   [name], MD  anastrozole (ARIMIDEX) 1 MG tablet Take 1 tablet (1 mg total) by mouth daily. 07/13/22   Derek Jack, MD  aspirin EC 81 MG tablet Take 81 mg by mouth daily.    [provider]  atorvastatin (LIPITOR) 20 MG tablet Take 1 tablet (20 mg total) by mouth daily. TAKE 1 TABLET DAILY 03/02/22   [name], MD  azithromycin (ZITHROMAX) 250 MG tablet Take 1 tablet (250 mg total) by mouth daily. Take first 2 tablets together, then  1 every day until finished. 08/15/22   Fransico Meadow, MD  B Complex-C-Zn-Folic Acid (DIALYVITE 400-QQPY 15) 0.8 MG TABS Take 1 tablet by mouth daily. 06/19/22   [provider]  cinacalcet (SENSIPAR) 30 MG tablet Take 2 tablets (60 mg total) by mouth daily. Patient taking differently: Take 30 mg by mouth daily. 06/13/18   [name], MD  Darbepoetin Alfa (ARANESP, ALBUMIN FREE, IJ) Darbepoetin Alfa (Aranesp) 06/23/22 06/22/23  [provider]  DULoxetine (CYMBALTA) 30 MG capsule TAKE 1 CAPSULE(30 MG) BY MOUTH DAILY Patient taking differently: Take 30 mg by mouth daily. 04/03/22   [name], MD  gabapentin (NEURONTIN) 300 MG capsule TAKE 1 CAPSULE(300 MG) BY MOUTH AT BEDTIME 08/03/22   [name], MD  HYDROcodone-acetaminophen (NORCO/VICODIN) 5-325 MG tablet Take 1 tablet by mouth every 6 (six) hours as needed. 06/20/22   [name], MD  isosorbide mononitrate (IMDUR) 30 MG 24 hr tablet Take 1 tablet (30 mg total) by mouth daily. 04/13/22   [name], MD  lanthanum (FOSRENOL) 1000 MG chewable tablet Chew 2,000-3,000 mg by mouth See admin instructions. Take 3 tablets (3000 mg) by mouth with meals and take 2 tablets (2000 mg) with snacks 10/16/19   [provider]  lidocaine-prilocaine (EMLA) cream Apply 1 application  topically every Monday, Wednesday, and Friday with hemodialysis. 09/24/18   [provider]  meclizine (ANTIVERT) 25 MG tablet TAKE 1 TABLET(25 MG) BY MOUTH THREE TIMES DAILY AS NEEDED FOR DIZZINESS Patient taking differently: Take 25 mg by mouth 3 (three) times daily as needed for dizziness or nausea. 03/21/22   [name], MD  nebivolol (BYSTOLIC) 10 MG tablet Take 1 tablet (10 mg total) by mouth in the morning and at bedtime. 04/13/22   [name], MD  pantoprazole (PROTONIX) 40 MG tablet TAKE 1 TABLET(40 MG) BY MOUTH DAILY 09/04/22   [name], MD      Allergies    Patient has no known allergies.     Review of Systems   Review of Systems  Respiratory:  Positive for shortness of breath.   Cardiovascular:  Positive for chest pain.  All other systems reviewed and are negative.   Physical Exam Updated Vital Signs BP (!) 141/81 (BP Location: Right Arm)   Pulse 76   Temp 97.6 F (36.4 C) (Oral)   Resp 15   Wt 83 kg   SpO2 100%   BMI 34.58 kg/m  Physical Exam Vitals and nursing note reviewed.  Constitutional:      General: She is not in acute distress.    Appearance: She is well-developed. She is not ill-appearing, toxic-appearing or diaphoretic.  HENT:     Head: Normocephalic and atraumatic.  Eyes:     Conjunctiva/sclera: Conjunctivae normal.  Cardiovascular:     Rate and Rhythm: Normal rate and regular rhythm.     Heart sounds: No murmur heard. Pulmonary:     Effort: Pulmonary effort is normal. No tachypnea, accessory muscle usage or respiratory distress.     Breath sounds: Examination of the right-lower field reveals decreased breath sounds. Examination of the left-lower field reveals decreased breath sounds. Decreased breath sounds present. No wheezing, rhonchi or rales.  Abdominal:     Palpations: Abdomen is soft.     Tenderness: There is no abdominal tenderness.  Musculoskeletal:        General: No swelling.     Cervical back: Normal range of motion and neck supple.     Right lower leg: No edema.     Left lower leg: No edema.  Skin:    General: Skin is warm and dry.     Coloration: Skin is not cyanotic or pale.  Neurological:     General: No focal deficit present.     Mental Status: She is alert and oriented to person, place, and time.  Psychiatric:        Mood and Affect: Mood normal.        Behavior: Behavior normal.     ED Results / Procedures / Treatments   Labs (all labs ordered are listed, but only abnormal results are displayed) Labs Reviewed  BASIC METABOLIC PANEL - Abnormal; Notable for the following components:      Result Value   Chloride  96 (*)    BUN 46 (*)    Creatinine, Ser 10.35 (*)    GFR, Estimated 4 (*)    All other components within normal limits  MAGNESIUM - Abnormal; Notable for the following components:   Magnesium 2.5 (*)    All other components within normal limits  CBC WITH DIFFERENTIAL/PLATELET - Abnormal; Notable for the following components:   RBC 3.29 (*)    Hemoglobin 11.2 (*)    HCT 34.6 (*)    MCV 105.2 (*)    All other components within normal limits  BRAIN NATRIURETIC PEPTIDE - Abnormal;  Notable for the following components:   B Natriuretic Peptide 130.0 (*)    All other components within normal limits  LIPASE, BLOOD  HEPATIC FUNCTION PANEL  TROPONIN I (HIGH SENSITIVITY)  TROPONIN I (HIGH SENSITIVITY)    EKG EKG Interpretation  Date/Time:  Tuesday September 12 2022 11:07:31 EST Ventricular Rate:  77 PR Interval:  149 QRS Duration: 87 QT Interval:  432 QTC Calculation: 489 R Axis:   14 Text Interpretation: Sinus rhythm Nonspecific T abnormalities, lateral leads Borderline prolonged QT interval Confirmed by Godfrey Pick ([id number]) on 09/12/2022 11:13:55 AM  Radiology DG Chest Portable 1 View  Result Date: 09/12/2022 CLINICAL DATA:  Chest pain, short of breath EXAM: PORTABLE CHEST 1 VIEW COMPARISON:  08/15/2022 FINDINGS: Single frontal view of the chest demonstrates an unremarkable cardiac silhouette. No airspace disease, effusion, or pneumothorax. No acute bony abnormalities. Postsurgical changes right axilla. IMPRESSION: 1. No acute intrathoracic process. Electronically Signed   By: Randa Ngo M.D.   On: 09/12/2022 11:21    Procedures Procedures    Medications Ordered in ED Medications  aspirin chewable tablet 324 mg (243 mg Oral Given 09/12/22 1151)  fentaNYL (SUBLIMAZE) injection 50 mcg (50 mcg Intravenous Given 09/12/22 1142)  alum & mag hydroxide-simeth (MAALOX/MYLANTA) 200-200-20 MG/5ML suspension 30 mL (30 mLs Oral Given 09/12/22 1346)    And  lidocaine (XYLOCAINE) 2 % viscous  mouth solution 15 mL (15 mLs Oral Given 09/12/22 1346)  famotidine (PEPCID) IVPB 20 mg premix (20 mg Intravenous New Bag/Given 09/12/22 1350)    ED Course/ Medical Decision Making/ A&P                             Medical Decision Making Amount and/or Complexity of Data Reviewed Labs: ordered. Radiology: ordered.  Risk OTC drugs. Prescription drug management.   This patient presents to the ED for concern of chest pain and shortness of breath, this involves an extensive number of treatment options, and is a complaint that carries with it a high risk of complications and morbidity.  The differential diagnosis includes ACS, pulm overload, pericarditis, GERD, pneumonia, anemia, acidosis   Co morbidities that complicate the patient evaluation  ESRD, HLD, HTN, CKD, GERD, T2DM, hemorrhoids, hypothyroidism, anemia   Additional history obtained:  Additional history obtained from N/A External records from outside source obtained and reviewed including EMR   Lab Tests:  I Ordered, and personally interpreted labs.  The pertinent results include: Baseline anemia, no leukocytosis, baseline creatinine/BUN, hypomagnesemia with otherwise normal electrolytes, normal troponins x 2, BNP only mildly elevated.   Imaging Studies ordered:  I ordered imaging studies including chest x-ray I independently visualized and interpreted imaging which showed No acute findings I agree with the radiologist interpretation   Cardiac Monitoring: / EKG:  The patient was maintained on a cardiac monitor.  I personally viewed and interpreted the cardiac monitored which showed an underlying rhythm of:  sinus rhythm  Problem List / ED Course / Critical interventions / Medication management  Patient presenting for intermittent chest pain and shortness of breath over the past week.  She arrives via EMS.  EMS reports normal vital signs during transit.  On exam, patient is well-appearing.  She does endorse ongoing  mild substernal chest pain in the lower aspect of her chest.  This area is not tender on palpation.  She does have diminished bibasilar lung sounds but lungs are otherwise clear to auscultation.  Her breathing is unlabored.  Pressure review, she is followed by [facility] (Dr. [name]).  In the past, she has had nuclear stress test in 2018 with normal perfusion, nuclear stress test in 2020 with no ischemia, heart cath in 2020 with mild disease.  She was seen in the ED in July of last year for chest pain.  Chest pain improved with Imdur, which she has been prescribed daily.  EKG today shows normal sinus rhythm without concerning ST segment changes.  Diagnostic workup was initiated. While in the ED, patient had resolution of her chest discomfort.  She then stated that she had a globus sensation in her throat.  This was described as similar to prior episodes of reflux.  GI cocktail was ordered.  Patient subsequently had resolution of all symptoms.  When ambulating in the ED, she did not experience shortness of breath or chest pain.  Workup is reassuring with normal troponins x 2, baseline anemia, no leukocytosis, and a BMP that is only slightly elevated and significantly down from prior lab work.  She does have an appointment scheduled with her cardiologist, Dr. [name] tomorrow.  She was advised to keep this appointment and continue her daily Protonix.  Carafate was prescribed for additional treatment of gastritis/reflux.  She was discharged in stable condition. I ordered medication including ASA for concern of ACS; fentanyl for chest pain; GI cocktail for reflux Reevaluation of the patient after these medicines showed that the patient resolved I have reviewed the patients home medicines and have made adjustments as needed   Social Determinants of Health:  Has access to outpatient care, including appointment with cardiology tomorrow.        Final Clinical Impression(s) / ED Diagnoses Final diagnoses:  Chest  pain, unspecified type    Rx / DC Orders ED Discharge Orders          Ordered    sucralfate (CARAFATE) 1 GM/10ML suspension  3 times daily with meals        09/12/22 1600              Godfrey Pick, MD 09/12/22 [phone]

## 2022-09-13 ENCOUNTER — Encounter: Payer: Self-pay | Admitting: Cardiology

## 2022-09-13 ENCOUNTER — Ambulatory Visit: Payer: Medicare Other | Attending: Family Medicine | Admitting: Cardiology

## 2022-09-13 VITALS — BP 110/70 | HR 84 | Ht 62.0 in | Wt 182.4 lb

## 2022-09-13 DIAGNOSIS — D631 Anemia in chronic kidney disease: Secondary | ICD-10-CM | POA: Diagnosis not present

## 2022-09-13 DIAGNOSIS — R0789 Other chest pain: Secondary | ICD-10-CM | POA: Diagnosis not present

## 2022-09-13 DIAGNOSIS — I159 Secondary hypertension, unspecified: Secondary | ICD-10-CM | POA: Diagnosis not present

## 2022-09-13 DIAGNOSIS — E119 Type 2 diabetes mellitus without complications: Secondary | ICD-10-CM | POA: Diagnosis not present

## 2022-09-13 DIAGNOSIS — I10 Essential (primary) hypertension: Secondary | ICD-10-CM | POA: Diagnosis not present

## 2022-09-13 DIAGNOSIS — Z992 Dependence on renal dialysis: Secondary | ICD-10-CM | POA: Diagnosis not present

## 2022-09-13 DIAGNOSIS — N186 End stage renal disease: Secondary | ICD-10-CM | POA: Diagnosis not present

## 2022-09-13 DIAGNOSIS — N2581 Secondary hyperparathyroidism of renal origin: Secondary | ICD-10-CM | POA: Diagnosis not present

## 2022-09-13 MED ORDER — ISOSORBIDE MONONITRATE ER 30 MG PO TB24: 45.0000 mg | ORAL_TABLET | Freq: Every day | ORAL | 3 refills | Status: DC

## 2022-09-13 MED ORDER — NITROGLYCERIN 0.4 MG SL SUBL: 0.4000 mg | SUBLINGUAL_TABLET | SUBLINGUAL | 3 refills | Status: DC | PRN

## 2022-09-13 NOTE — Progress Notes (Signed)
Clinical Summary [name] is a 62 y.o.female seen today for follow up of the following medical problems.      1.Chest pain - long history of chest pain 2018 nuclear stress: normal perfusion Jan 2020 nuclear stress no ischemia - 02/2019 cath: prox RCA 25%, mid LAD 10%, otherwise normal vessels   ER visit 11/17/21 with chest pain - trop neg x2, EKG SR without ischemic changes - pressure midchest. 6/10 in severity. Can occur at rest or with activity. No other associated symptoms. Lasts few seconds. Not positional. Not related to eating. - similar to prior chest pains.     ER visit 02/2022 with headache, chest pain -trops neg x 2.  - initially chest pains had improved on imudr, but recurrence - pcp increased imdur to '30mg'$  daily.      ER visit jan 2024 with chest pain - trops neg, EKG SR no specific ischemic changes -chronic chest pains similar to prior symptoms - at home at rest onset of pain, started right sided aching pain. Very severe pain, some SOB. Lightheaded. Pain not positional. Lasted a few hours. Pain improved with fentanyl    2.. HTN - compliant with meds        3. ESRD Past Medical History:  Diagnosis Date   (HFpEF) heart failure with preserved ejection fraction ([place])    a. 01/2019 Echo: EF 55-60%, mild conc LVH. DD.  Torn MV chordae.   Anemia    Atypical chest pain    a. 08/2018 MV: EF 59%, no ischemia; b. 02/2019 Cath: nonobs dzs.   Blood transfusion without reported diagnosis    Breast cancer ([place]) 10/12/2020   Cataract    ESRD (end stage renal disease) on dialysis [facility])    a. HD T, T, S   Essential hypertension, benign    GERD (gastroesophageal reflux disease)    Headache    Hemorrhoids    Mixed hyperlipidemia    Morbid obesity (HCC)    Non-obstructive CAD (coronary artery disease)    a. 02/2019 CathL LM nl, LAD 46m LCX nl, RCA 25p, EF 55-65%.   PONV (postoperative nausea and vomiting)    S/P colonoscopy Jan 2011   Dr. HBenson Norway sessile polyp  (benign lymphoid), large hemorrhoids, repeat 5-10 years   Temporal arteritis ([place]    Type 2 diabetes mellitus (HCC)    Wears glasses      No Known Allergies   Current Outpatient Medications  Medication Sig Dispense Refill   amLODipine (NORVASC) 10 MG tablet TAKE 1 TABLET BY MOUTH AT BEDTIME 90 tablet 3   anastrozole (ARIMIDEX) 1 MG tablet Take 1 tablet (1 mg total) by mouth daily. 30 tablet 6   aspirin EC 81 MG tablet Take 81 mg by mouth daily.     atorvastatin (LIPITOR) 20 MG tablet Take 1 tablet (20 mg total) by mouth daily. TAKE 1 TABLET DAILY 90 tablet 3   azithromycin (ZITHROMAX) 250 MG tablet Take 1 tablet (250 mg total) by mouth daily. Take first 2 tablets together, then 1 every day until finished. 6 tablet 0   B Complex-C-Zn-Folic Acid (DIALYVITE 8696-EXBM15) 0.8 MG TABS Take 1 tablet by mouth daily.     cinacalcet (SENSIPAR) 30 MG tablet Take 2 tablets (60 mg total) by mouth daily. (Patient taking differently: Take 30 mg by mouth daily.) 90 tablet 0   Darbepoetin Alfa (ARANESP, ALBUMIN FREE, IJ) Darbepoetin Alfa (Aranesp)     DULoxetine (CYMBALTA) 30 MG capsule TAKE 1  CAPSULE(30 MG) BY MOUTH DAILY (Patient taking differently: Take 30 mg by mouth daily.) 90 capsule 1   gabapentin (NEURONTIN) 300 MG capsule TAKE 1 CAPSULE(300 MG) BY MOUTH AT BEDTIME 90 capsule 1   HYDROcodone-acetaminophen (NORCO/VICODIN) 5-325 MG tablet Take 1 tablet by mouth every 6 (six) hours as needed. 20 tablet 0   isosorbide mononitrate (IMDUR) 30 MG 24 hr tablet Take 1 tablet (30 mg total) by mouth daily. 90 tablet 3   lanthanum (FOSRENOL) 1000 MG chewable tablet Chew 2,000-3,000 mg by mouth See admin instructions. Take 3 tablets (3000 mg) by mouth with meals and take 2 tablets (2000 mg) with snacks     lidocaine-prilocaine (EMLA) cream Apply 1 application  topically every Monday, Wednesday, and Friday with hemodialysis.     meclizine (ANTIVERT) 25 MG tablet TAKE 1 TABLET(25 MG) BY MOUTH THREE TIMES DAILY AS  NEEDED FOR DIZZINESS (Patient taking differently: Take 25 mg by mouth 3 (three) times daily as needed for dizziness or nausea.) 30 tablet 1   nebivolol (BYSTOLIC) 10 MG tablet Take 1 tablet (10 mg total) by mouth in the morning and at bedtime. 180 tablet 3   pantoprazole (PROTONIX) 40 MG tablet TAKE 1 TABLET(40 MG) BY MOUTH DAILY 30 tablet 0   sucralfate (CARAFATE) 1 GM/10ML suspension Take 10 mLs (1 g total) by mouth with breakfast, with lunch, and with evening meal. 420 mL 0   No current facility-administered medications for this visit.     Past Surgical History:  Procedure Laterality Date   ABDOMINAL HYSTERECTOMY     APPENDECTOMY     ARTERY BIOPSY N/A 05/09/2018   Procedure: RIGHT TEMPORAL ARTERY BIOPSY;  Surgeon: Judeth Horn, MD;  Location: [facility];  Service: General;  Laterality: N/A;   BREAST BIOPSY Right 06/15/2020   Procedure: RIGHT BREAST BIOPSY;  Surgeon: Aviva Signs, MD;  Location: [facility];  Service: General;  Laterality: Right;   CATARACT EXTRACTION W/PHACO Left 02/09/2017   Procedure: CATARACT EXTRACTION PHACO AND INTRAOCULAR LENS PLACEMENT LEFT EYE;  Surgeon: Tonny , MD;  Location: [facility];  Service: Ophthalmology;  Laterality: Left;  CDE: 4.89   CATARACT EXTRACTION W/PHACO Right 06/04/2017   Procedure: CATARACT EXTRACTION PHACO AND INTRAOCULAR LENS PLACEMENT (IOC);  Surgeon: Tonny , MD;  Location: [facility];  Service: Ophthalmology;  Laterality: Right;  CDE: 4.12   CHOLECYSTECTOMY  09/29/2011   Procedure: LAPAROSCOPIC CHOLECYSTECTOMY;  Surgeon: Jamesetta So, MD;  Location: [facility];  Service: General;  Laterality: N/A;   COLONOSCOPY  08/2009   Dr. Benson Norway: sessile polyp (benign lymphoid), large hemorrhoids, repeat 5-10 years   COLONOSCOPY N/A 06/12/2016   prominent hemorrhoids   COLONOSCOPY WITH PROPOFOL N/A 06/16/2021   Procedure: COLONOSCOPY WITH PROPOFOL;  Surgeon: Daneil Dolin, MD;  [facility];  Service: Endoscopy;  Laterality: N/A;  9:30am (dialysis  pt)   ESOPHAGOGASTRODUODENOSCOPY  09/05/2011   YNW:GNFAO hiatal hernia; remainder of exam normal. No explanation for patient's abdominal pain with today's examination   ESOPHAGOGASTRODUODENOSCOPY N/A 12/17/2013   Dr. [name]: gastric erythema, erosion, mild chronic inflammation on path    ESOPHAGOGASTRODUODENOSCOPY (EGD) WITH PROPOFOL N/A 06/16/2021   Procedure: ESOPHAGOGASTRODUODENOSCOPY (EGD) WITH PROPOFOL;  Surgeon: Daneil Dolin, MD;  [facility];  Service: Endoscopy;  Laterality: N/A;   EXCISION OF BREAST BIOPSY Right 10/12/2020   Procedure: EXCISION OF RIGHT BREAST BIOPSY;  Surgeon: Aviva Signs, MD;  [facility];  Service: General;  Laterality: Right;   LAPAROSCOPIC APPENDECTOMY  09/29/2011  Procedure: APPENDECTOMY LAPAROSCOPIC;  Surgeon: Jamesetta So, MD;  Location[facility];  Service: General;;  incidental appendectomy   LEFT HEART CATH AND CORONARY ANGIOGRAPHY N/A 02/28/2019   Procedure: LEFT HEART CATH AND CORONARY ANGIOGRAPHY;  Surgeon: Jettie Booze, MD;  Location: [place] [facility];  Service: Cardiovascular;  Laterality: N/A;   MASTECTOMY MODIFIED RADICAL Right 02/18/2020   Procedure: MASTECTOMY MODIFIED RADICAL;  Surgeon: Aviva Signs, MD;  [facility];  Service: General;  Laterality: Right;   MASTECTOMY, PARTIAL Right 07/13/2020   Procedure: RIGHT PARTIAL MASTECTOMY;  Surgeon: Aviva Signs, MD;  Location[facility];  Service: General;  Laterality: Right;   PARTIAL MASTECTOMY WITH NEEDLE LOCALIZATION AND AXILLARY SENTINEL LYMPH NODE BX Right 09/18/2018   Procedure: RIGHT PARTIAL MASTECTOMY AFTER NEEDLE LOCALIZATION, SENTINEL LYMPH NODE BIOPSY RIGHT AXILLA;  Surgeon: Aviva Signs, MD;  Location[facility];  Service: General;  Laterality: Right;   POLYPECTOMY  06/16/2021   Procedure: POLYPECTOMY;  Surgeon: Daneil Dolin, MD;  [facility];  Service: Endoscopy;;   SIMPLE MASTECTOMY WITH AXILLARY SENTINEL NODE BIOPSY Left 06/15/2020    Procedure: LEFT SIMPLE MASTECTOMY;  Surgeon: Aviva Signs, MD;  Location[facility];  Service: General;  Laterality: Left;     No Known Allergies    Family History  Problem Relation Age of Onset   Hypertension Mother    Coronary artery disease Mother    Diabetes Mother    Hypertension Sister    Coronary artery disease Sister    Hypertension Brother    Heart attack Father    Hypertension Son    Heart attack Maternal Aunt    Hypertension Maternal Aunt    Diabetes Maternal Aunt    Heart attack Maternal Uncle    Hypertension Maternal Uncle    Diabetes Maternal Uncle    Heart attack Paternal Aunt    Hypertension Paternal Aunt    Diabetes Paternal Aunt    Heart attack Paternal Uncle    Hypertension Paternal Uncle    Diabetes Paternal Uncle    Heart attack Maternal Grandmother    Heart attack Maternal Grandfather    Heart attack Paternal Grandmother    Heart attack Paternal Grandfather    Colon cancer Neg Hx      Social History [name] that she has never smoked. She has never been exposed to tobacco smoke. She has never used smokeless tobacco. Megan [name] [name] no history of alcohol use.   Review of Systems CONSTITUTIONAL: No weight loss, fever, chills, weakness or fatigue.  HEENT: Eyes: No visual loss, blurred vision, double vision or yellow sclerae.No hearing loss, sneezing, congestion, runny nose or sore throat.  SKIN: No rash or itching.  CARDIOVASCULAR: per hpi RESPIRATORY: No shortness of breath, cough or sputum.  GASTROINTESTINAL: No anorexia, nausea, vomiting or diarrhea. No abdominal pain or blood.  GENITOURINARY: No burning on urination, no polyuria NEUROLOGICAL: No headache, dizziness, syncope, paralysis, ataxia, numbness or tingling in the extremities. No change in bowel or bladder control.  MUSCULOSKELETAL: No muscle, back pain, joint pain or stiffness.  LYMPHATICS: No enlarged nodes. No history of splenectomy.  PSYCHIATRIC: No history of  depression or anxiety.  ENDOCRINOLOGIC: No [name] of sweating, cold or heat intolerance. No polyuria or polydipsia.  Marland Kitchen   Physical Examination Today's Vitals   09/13/22 1404  BP: 110/70  Pulse: 84  SpO2: 95%  Weight: 182 lb 6.4 oz (82.7 kg)  Height: '5\' 2"'$  (1.575 m)   Body mass index is  33.36 kg/m.  Gen: resting comfortably, no acute distress HEENT: no scleral icterus, pupils equal round and reactive, no palptable cervical adenopathy,  CV: RRR, no mrg, no jvd Resp: Clear to auscultation bilaterally GI: abdomen is soft, non-tender, non-distended, normal bowel sounds, no hepatosplenomegaly MSK: extremities are warm, no edema.  Skin: warm, no rash Neuro:  no focal deficits Psych: appropriate affect   Diagnostic Studies  NST: 08/2018 Nuclear stress EF: 59%. There was no ST segment deviation noted during stress. This is a low risk study. The left ventricular ejection fraction is normal (55-65%).   Assessment and Plan  1.Chest pain - several year history of chest pain - ischemic evaluations have been negative including stress testing in 2018 and 2020 and cath in 2020 - symptoms improved with imdur - recent ER visits chest pains similar to her chronic pains. Try increasing imdur to '45mg'$  daily, give prn SL NG.  - Suspect possibly vasospasm as etiology of symptoms given nitro responsiveness and lack of obstructive disease on prior testing.    2. HTN - at goal, continue current meds      Arnoldo Lenis, M.D.

## 2022-09-13 NOTE — Patient Instructions (Addendum)
Medication Instructions:  Your physician has recommended you make the following change in your medication:  Increase isosorbide mononitrate to 45 mg daily Nitroglycerin 0.4 mg Sublingual tablets sent to your pharmacy. Dissolve one under tongue for chest pain every 5 minutes up to 3 doses. If no relief, proceed to ED. Continue other medications the same  Labwork: none  Testing/Procedures: none  Follow-Up: Your physician recommends that you schedule a follow-up appointment in: 6 months  Any Other Special Instructions Will Be Listed Below (If Applicable).  If you need a refill on your cardiac medications before your next appointment, please call your pharmacy.

## 2022-09-14 ENCOUNTER — Ambulatory Visit: Payer: Medicare Other | Admitting: Cardiology

## 2022-09-14 ENCOUNTER — Ambulatory Visit (INDEPENDENT_AMBULATORY_CARE_PROVIDER_SITE_OTHER): Payer: Medicare Other

## 2022-09-14 VITALS — Ht 62.0 in | Wt 179.0 lb

## 2022-09-14 DIAGNOSIS — Z0000 Encounter for general adult medical examination without abnormal findings: Secondary | ICD-10-CM | POA: Diagnosis not present

## 2022-09-14 DIAGNOSIS — Z992 Dependence on renal dialysis: Secondary | ICD-10-CM | POA: Diagnosis not present

## 2022-09-14 DIAGNOSIS — N040 Nephrotic syndrome with minor glomerular abnormality: Secondary | ICD-10-CM | POA: Diagnosis not present

## 2022-09-14 DIAGNOSIS — N186 End stage renal disease: Secondary | ICD-10-CM | POA: Diagnosis not present

## 2022-09-14 NOTE — Progress Notes (Signed)
Subjective:   Megan [name] is a 62 y.o. female who presents for Medicare Annual (Subsequent) preventive examination.  I connected with  Megan [name] on 09/14/22 by a audio enabled telemedicine application and verified that I am speaking with the correct person using two identifiers.  Patient Location: Home  Provider Location: Office/Clinic  I discussed the limitations of evaluation and management by telemedicine. The patient expressed understanding and agreed to proceed.  Review of Systems     Cardiac Risk Factors include: advanced age (>54mn, >>17women);diabetes mellitus;dyslipidemia;hypertension     Objective:    Today's Vitals   09/14/22 1546  Weight: 179 lb (81.2 kg)  Height: '5\' 2"'$  (1.575 m)   Body mass index is 32.74 kg/m.     09/14/2022    4:06 PM 09/12/2022   11:04 AM 08/15/2022   10:22 AM 07/13/2022    8:21 AM 07/04/2022    8:21 AM 04/27/2022    3:45 PM 02/26/2022    9:36 AM  Advanced Directives  Does Patient Have a Medical Advance Directive? Yes Yes Yes Yes Yes Yes Yes  Type of Advance Directive Living will;Healthcare Power of AFort Myers BeachLiving will HFruitdaleLiving will H[place]Living will H[place]villeLiving will HBergerLiving will HCherryvilleLiving will  Does patient want to make changes to medical advance directive? No - Patient declined   No - Patient declined  No - Patient declined   Copy of [place]Adamsvillein Chart? No - copy requested  No - copy requested No - copy requested     Would patient like information on creating a medical advance directive?      No - Patient declined     Current Medications (verified) Outpatient Encounter Medications as of 09/14/2022  Medication Sig   amLODipine (NORVASC) 10 MG tablet TAKE 1 TABLET BY MOUTH AT BEDTIME   anastrozole (ARIMIDEX) 1 MG tablet Take 1 tablet (1 mg total) by  mouth daily.   aspirin EC 81 MG tablet Take 81 mg by mouth daily.   atorvastatin (LIPITOR) 20 MG tablet Take 1 tablet (20 mg total) by mouth daily. TAKE 1 TABLET DAILY   B Complex-C-Zn-Folic Acid (DIALYVITE [id number]) 0.8 MG TABS Take 1 tablet by mouth daily.   cinacalcet (SENSIPAR) 30 MG tablet Take 2 tablets (60 mg total) by mouth daily. (Patient taking differently: Take 30 mg by mouth daily.)   Darbepoetin Alfa (ARANESP, ALBUMIN FREE, IJ) Darbepoetin Alfa (Aranesp)   DULoxetine (CYMBALTA) 30 MG capsule TAKE 1 CAPSULE(30 MG) BY MOUTH DAILY (Patient taking differently: Take 30 mg by mouth daily.)   gabapentin (NEURONTIN) 300 MG capsule TAKE 1 CAPSULE(300 MG) BY MOUTH AT BEDTIME   isosorbide mononitrate (IMDUR) 30 MG 24 hr tablet Take 1.5 tablets (45 mg total) by mouth daily.   lanthanum (FOSRENOL) 1000 MG chewable tablet Chew 2,000-3,000 mg by mouth See admin instructions. Take 3 tablets (3000 mg) by mouth with meals and take 2 tablets (2000 mg) with snacks   lidocaine-prilocaine (EMLA) cream Apply 1 application  topically every Monday, Wednesday, and Friday with hemodialysis.   meclizine (ANTIVERT) 25 MG tablet TAKE 1 TABLET(25 MG) BY MOUTH THREE TIMES DAILY AS NEEDED FOR DIZZINESS (Patient taking differently: Take 25 mg by mouth 3 (three) times daily as needed for dizziness or nausea.)   nebivolol (BYSTOLIC) 10 MG tablet Take 1 tablet (10 mg total) by mouth in the morning and at  bedtime.   nitroGLYCERIN (NITROSTAT) 0.4 MG SL tablet Place 1 tablet (0.4 mg total) under the tongue every 5 (five) minutes x 3 doses as needed for chest pain (if no relief after 3rd dose, proceed to ED or call 911).   pantoprazole (PROTONIX) 40 MG tablet TAKE 1 TABLET(40 MG) BY MOUTH DAILY   sucralfate (CARAFATE) 1 GM/10ML suspension Take 10 mLs (1 g total) by mouth with breakfast, with lunch, and with evening meal.   [DISCONTINUED] azithromycin (ZITHROMAX) 250 MG tablet Take 1 tablet (250 mg total) by mouth daily. Take  first 2 tablets together, then 1 every day until finished.   HYDROcodone-acetaminophen (NORCO/VICODIN) 5-325 MG tablet Take 1 tablet by mouth every 6 (six) hours as needed. (Patient not taking: Reported on 09/14/2022)   No facility-administered encounter medications on file as of 09/14/2022.    Allergies (verified) Patient has no known allergies.   History: Past Medical History:  Diagnosis Date   (HFpEF) heart failure with preserved ejection fraction ([place])    a. 01/2019 Echo: EF 55-60%, mild conc LVH. DD.  Torn MV chordae.   Anemia    Atypical chest pain    a. 08/2018 MV: EF 59%, no ischemia; b. 02/2019 Cath: nonobs dzs.   Blood transfusion without reported diagnosis    Breast cancer ([place]) 10/12/2020   Cataract    ESRD (end stage renal disease) on dialysis [facility])    a. HD T, T, S   Essential hypertension, benign    GERD (gastroesophageal reflux disease)    Headache    Hemorrhoids    Mixed hyperlipidemia    Morbid obesity (HCC)    Non-obstructive CAD (coronary artery disease)    a. 02/2019 CathL LM nl, LAD 32m LCX nl, RCA 25p, EF 55-65%.   PONV (postoperative nausea and vomiting)    S/P colonoscopy Jan 2011   Dr. 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 ([place]    Type 2 diabetes mellitus ([place]    Wears glasses    Past Surgical History:  Procedure Laterality Date   ABDOMINAL HYSTERECTOMY     APPENDECTOMY     ARTERY BIOPSY N/A 05/09/2018   Procedure: RIGHT TEMPORAL ARTERY BIOPSY;  Surgeon: WJudeth Horn MD;  [place]  Service: General;  Laterality: N/A;   BREAST BIOPSY Right 06/15/2020   Procedure: RIGHT BREAST BIOPSY;  Surgeon: JAviva Signs MD;  Location: [facility];  Service: General;  Laterality: Right;   CATARACT EXTRACTION W/PHACO Left 02/09/2017   Procedure: CATARACT EXTRACTION PHACO AND INTRAOCULAR LENS PLACEMENT LEFT EYE;  Surgeon: HTonny Branch MD;  Location: [facility];  Service: Ophthalmology;  Laterality: Left;   CDE: 4.89   CATARACT EXTRACTION W/PHACO Right 06/04/2017   Procedure: CATARACT EXTRACTION PHACO AND INTRAOCULAR LENS PLACEMENT (IOC);  Surgeon: HTonny Branch MD;  Location: [facility];  Service: Ophthalmology;  Laterality: Right;  CDE: 4.12   CHOLECYSTECTOMY  09/29/2011   Procedure: LAPAROSCOPIC CHOLECYSTECTOMY;  Surgeon: MJamesetta So MD;  Location: [facility];  Service: General;  Laterality: N/A;   COLONOSCOPY  08/2009   Dr. HBenson Norway sessile polyp (benign lymphoid), large hemorrhoids, repeat 5-10 years   COLONOSCOPY N/A 06/12/2016   prominent hemorrhoids   COLONOSCOPY WITH PROPOFOL N/A 06/16/2021   Procedure: COLONOSCOPY WITH PROPOFOL;  Surgeon: RDaneil Dolin MD;  [facility];  Service: Endoscopy;  Laterality: N/A;  9:30am (dialysis pt)   ESOPHAGOGASTRODUODENOSCOPY  09/05/2011   RYPP:JKDTOhiatal hernia; remainder of exam normal. No  explanation for patient's abdominal pain with today's examination   ESOPHAGOGASTRODUODENOSCOPY N/A 12/17/2013   Dr. [name]: gastric erythema, erosion, mild chronic inflammation on path    ESOPHAGOGASTRODUODENOSCOPY (EGD) WITH PROPOFOL N/A 06/16/2021   Procedure: ESOPHAGOGASTRODUODENOSCOPY (EGD) WITH PROPOFOL;  Surgeon: Daneil Dolin, MD;  Location[facility];  Service: Endoscopy;  Laterality: N/A;   EXCISION OF BREAST BIOPSY Right 10/12/2020   Procedure: EXCISION OF RIGHT BREAST BIOPSY;  Surgeon: Aviva Signs, MD;  Location: [facility];  Service: General;  Laterality: Right;   LAPAROSCOPIC APPENDECTOMY  09/29/2011   Procedure: APPENDECTOMY LAPAROSCOPIC;  Surgeon: Jamesetta So, MD;  Location: [facility];  Service: General;;  incidental appendectomy   LEFT HEART CATH AND CORONARY ANGIOGRAPHY N/A 02/28/2019   Procedure: LEFT HEART CATH AND CORONARY ANGIOGRAPHY;  Surgeon: Jettie Booze, MD;  Location: [facility];  Service: Cardiovascular;  Laterality: N/A;   MASTECTOMY MODIFIED RADICAL Right 02/18/2020   Procedure: MASTECTOMY MODIFIED RADICAL;   Surgeon: Aviva Signs, MD;  Location: [facility];  Service: General;  Laterality: Right;   MASTECTOMY, PARTIAL Right 07/13/2020   Procedure: RIGHT PARTIAL MASTECTOMY;  Surgeon: Aviva Signs, MD;  Location: [facility];  Service: General;  Laterality: Right;   PARTIAL MASTECTOMY WITH NEEDLE LOCALIZATION AND AXILLARY SENTINEL LYMPH NODE BX Right 09/18/2018   Procedure: RIGHT PARTIAL MASTECTOMY AFTER NEEDLE LOCALIZATION, SENTINEL LYMPH NODE BIOPSY RIGHT AXILLA;  Surgeon: Aviva Signs, MD;  Location: [facility];  Service: General;  Laterality: Right;   POLYPECTOMY  06/16/2021   Procedure: POLYPECTOMY;  Surgeon: Daneil Dolin, MD;  [facility];  Service: Endoscopy;;   SIMPLE MASTECTOMY WITH AXILLARY SENTINEL NODE BIOPSY Left 06/15/2020   Procedure: LEFT SIMPLE MASTECTOMY;  Surgeon: Aviva Signs, MD;  Location: [facility];  Service: General;  Laterality: Left;   Family History  Problem Relation Age of Onset   Hypertension Mother    Coronary artery disease Mother    Diabetes Mother    Hypertension Sister    Coronary artery disease Sister    Hypertension Brother    Heart attack Father    Hypertension Son    Heart attack Maternal Aunt    Hypertension Maternal Aunt    Diabetes Maternal Aunt    Heart attack Maternal Uncle    Hypertension Maternal Uncle    Diabetes Maternal Uncle    Heart attack Paternal Aunt    Hypertension Paternal Aunt    Diabetes Paternal Aunt    Heart attack Paternal Uncle    Hypertension Paternal Uncle    Diabetes Paternal Uncle    Heart attack Maternal Grandmother    Heart attack Maternal Grandfather    Heart attack Paternal Grandmother    Heart attack Paternal Grandfather    Colon cancer Neg Hx    Social History   Socioeconomic History   Marital status: Married    Spouse name: Not on file   Number of children: Not on file   Years of education: Not on file   Highest education level: Not on file  Occupational History   Occupation: Merchandiser, retail: [id number]  Tobacco Use   Smoking status: Never    Passive exposure: Never   Smokeless tobacco: Never  Vaping Use   Vaping Use: Never used  Substance and Sexual Activity   Alcohol use: No   Drug use: No   Sexual activity: Yes    Birth control/protection: Surgical  Other Topics Concern   Not on file  Social History Narrative   Works at [employer] in [place].    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   Also has to get items from high shelves-causes achy pain in shoulder area      Married.   Children are grown, out of house.   Social Determinants of Health   Financial Resource Strain: Low Risk  (09/14/2022)   Overall Financial Resource Strain (CARDIA)    Difficulty of Paying Living Expenses: Not hard at all  Food Insecurity: No Food Insecurity (09/14/2022)   Hunger Vital Sign    Worried About Running Out of Food in the Last Year: Never true    Ran Out of Food in the Last Year: Never true  Transportation Needs: No Transportation Needs (09/14/2022)   PRAPARE - Hydrologist (Medical): No    Lack of Transportation (Non-Medical): No  Physical Activity: Inactive (09/14/2022)   Exercise Vital Sign    Days of Exercise per Week: 0 days    Minutes of Exercise per Session: 0 min  Stress: No Stress Concern Present (09/14/2022)   [place]    Feeling of Stress : Not at all  Social Connections: [place] (09/14/2022)   Social Connection and Isolation Panel [NHANES]    Frequency of Communication with Friends and Family: More than three times a week    Frequency of Social Gatherings with Friends and Family: Three times a week    Attends Religious Services: More than 4 times per year    Active Member of Clubs or Organizations: Yes    Attends Music therapist: More than 4 times per year    Marital Status: Married    Tobacco Counseling Counseling given: Not  Answered   Clinical Intake:  Pre-visit preparation completed: Yes  Pain : No/denies pain  Diabetes: Yes CBG done?: No Did pt. bring in CBG monitor from home?: No  How often do you need to have someone help you when you read instructions, pamphlets, or other written materials from your doctor or pharmacy?: 1 - Never  Diabetic?Yes  Nutrition Risk Assessment:  Has the patient had any N/V/D within the last 2 months?  No  Does the patient have any non-healing wounds?  No  Has the patient had any unintentional weight loss or weight gain?  No   Diabetes:  Is the patient diabetic?  Yes  If diabetic, was a CBG obtained today?  No  Did the patient bring in their glucometer from home?  No  How often do you monitor your CBG's? No longer checks .   Financial Strains and Diabetes Management:  Are you having any financial strains with the device, your supplies or your medication? No .  Does the patient want to be seen by Chronic Care Management for management of their diabetes?  No  Would the patient like to be referred to a Nutritionist or for Diabetic Management?  No   Diabetic Exams:  Diabetic Eye Exam: Overdue for diabetic eye exam. Pt has been advised about the importance in completing this exam. Patient advised to call and schedule an eye exam. Diabetic Foot Exam: Overdue, Pt has been advised about the importance in completing this exam. Pt is scheduled for diabetic foot exam on at next office visit .   Interpreter Needed?: No  Information entered by :: Denman George LPN   Activities of Daily Living    09/14/2022  4:06 PM  In your present state of health, do you have any difficulty performing the following activities:  Hearing? 0  Vision? 0  Difficulty concentrating or making decisions? 0  Walking or climbing stairs? 0  Dressing or bathing? 0  Doing errands, shopping? 0  Preparing Food and eating ? N  Using the Toilet? N  In the past six months, have you accidently  leaked urine? N  Do you have problems with loss of bowel control? N  Managing your Medications? N  Managing your Finances? N  Housekeeping or managing your Housekeeping? N    Patient Care Team: [name], MD as PCP - General (Family Medicine) [name] [name], MD as PCP - Cardiology (Cardiology) [name] [name], MD (Gastroenterology) [name], MD as Consulting Physician (Cardiology) Derek Jack, MD as Medical Oncologist (Medical Oncology) Donato Heinz, MD as Consulting Physician (Nephrology)  Indicate any recent Medical Services you may have received from other than [facility] providers in the past year (date may be approximate).     Assessment:   This is a routine wellness examination for Rileyann.  Hearing/Vision screen Hearing Screening - Comments:: Denies hearing difficulties   Vision Screening - Comments:: Denies vision problems; plans to schedule routine exam soon   Dietary issues and exercise activities discussed: Current Exercise Habits: The patient does not participate in regular exercise at present   Goals Addressed   None   Depression Screen    09/14/2022    3:54 PM 09/09/2021   11:57 AM 04/05/2021    8:55 AM 01/06/2021    3:32 PM 07/16/2019   11:23 AM 05/16/2018   12:20 PM 08/28/2017   12:34 PM  PHQ 2/9 Scores  PHQ - 2 Score 0 0 0 0 0 0 0    Fall Risk    09/14/2022    3:50 PM 09/09/2021   12:01 PM 04/05/2021    8:55 AM 01/06/2021    3:32 PM 05/27/2020    9:17 AM  [place] in the past year? 0 0 0 0 0  Number falls in past yr: 0 0  0   Injury with Fall? 0 0  0   Risk for fall due to :  No Fall Risks No Fall Risks No Fall Risks   Follow up Falls prevention discussed;Education provided;Falls evaluation completed Falls prevention discussed Falls evaluation completed Falls evaluation completed Falls evaluation completed    FALL RISK PREVENTION PERTAINING TO THE HOME:  Any stairs in or around the home? No  If so, are there  any without handrails? No  Home free of loose throw rugs in walkways, pet beds, electrical cords, etc? Yes  Adequate lighting in your home to reduce risk of falls? Yes   ASSISTIVE DEVICES UTILIZED TO PREVENT FALLS:  Life alert? No  Use of a cane, walker or w/c? No  Grab bars in the bathroom? Yes  Shower chair or bench in shower? No  Elevated toilet seat or a handicapped toilet? Yes   TIMED UP AND GO:  Was the test performed? No . Telephonic visit   Cognitive Function:        09/14/2022    4:07 PM 09/09/2021   12:03 PM  6CIT Screen  What Year? 0 points 0 points  What month? 0 points 0 points  What time? 0 points 0 points  Count back from 20 0 points 0 points  Months in reverse 0 points 2 points  Repeat phrase 0 points  2 points  Total Score 0 points 4 points    Immunizations Immunization History  Administered Date(s) Administered   Hepatitis B 03/27/2017   Hepatitis B, adult 03/27/2017, 05/02/2017, 05/31/2017, 10/04/2017, 09/08/2019, 10/01/2019, 10/30/2019   Hepb-cpg 03/03/2020   Influenza Whole 05/22/2007   Influenza, Quadrivalent, Recombinant, Inj, Pf 05/19/2022   Influenza, Seasonal, Injecte, Preservative Fre 05/10/2018   Influenza,inj,Quad PF,6+ Mos 04/29/2019, 05/10/2020, 05/06/2021   Moderna Sars-Covid-2 Vaccination 11/11/2019, 12/10/2019, 07/02/2020   Pfizer Covid-19 Vaccine Bivalent Booster 58yr & up 09/09/2021   Pneumococcal Conjugate-13 03/15/2017, 02/07/2019   Pneumococcal Polysaccharide-23 08/27/2015, 10/01/2020   Td 07/20/2005    TDAP status: Due, Education has been provided regarding the importance of this vaccine. Advised may receive this vaccine at local pharmacy or Health Dept. Aware to provide a copy of the vaccination record if obtained from local pharmacy or Health Dept. Verbalized acceptance and understanding.  Flu Vaccine status: Up to date  Pneumococcal vaccine status: Up to date  Covid-19 vaccine status: Information provided on how to obtain  vaccines.   Qualifies for Shingles Vaccine? Yes   Zostavax completed No   Shingrix Completed?: No.    Education has been provided regarding the importance of this vaccine. Patient has been advised to call insurance company to determine out of pocket expense if they have not yet received this vaccine. Advised may also receive vaccine at local pharmacy or Health Dept. Verbalized acceptance and understanding.  Screening Tests Health Maintenance  Topic Date Due   Hepatitis C Screening  Never done   Zoster Vaccines- Shingrix (1 of 2) Never done   DTaP/Tdap/Td (2 - Tdap) 07/21/2015   OPHTHALMOLOGY EXAM  09/15/2015   FOOT EXAM  08/26/2016   HEMOGLOBIN A1C  05/27/2021   MAMMOGRAM  09/01/2021   COVID-19 Vaccine (5 - 2023-24 season) 04/14/2022   PAP SMEAR-Modifier  06/04/2022   Medicare Annual Wellness (AWV)  09/15/2023   COLONOSCOPY (Pts 45-439yrInsurance coverage will need to be confirmed)  06/17/2031   INFLUENZA VACCINE  Completed   HIV Screening  Completed   HPV VACCINES  Aged Out    Health Maintenance  Health Maintenance Due  Topic Date Due   Hepatitis C Screening  Never done   Zoster Vaccines- Shingrix (1 of 2) Never done   DTaP/Tdap/Td (2 - Tdap) 07/21/2015   OPHTHALMOLOGY EXAM  09/15/2015   FOOT EXAM  08/26/2016   HEMOGLOBIN A1C  05/27/2021   MAMMOGRAM  09/01/2021   COVID-19 Vaccine (5 - 2023-24 season) 04/14/2022   PAP SMEAR-Modifier  06/04/2022    Colorectal cancer screening: Type of screening: Colonoscopy. Completed 06/16/21. Repeat every 10 years  Mammogram status: No longer required due to history of breast cancer .  Lung Cancer Screening: (Low Dose CT Chest recommended if Age 803-80ears, 30 pack-year currently smoking OR have quit w/in 15years.) does not qualify.   Lung Cancer Screening Referral: n/a   Additional Screening:  Hepatitis C Screening: does qualify; Completed at next office visit   Vision Screening: Recommended annual ophthalmology exams for early  detection of glaucoma and other disorders of the eye. Is the patient up to date with their annual eye exam?  No  Who is the provider or what is the name of the office in which the patient attends annual eye exams? None  If pt is not established with a provider, would they like to be referred to a provider to establish care? No .   Dental Screening: Recommended annual dental exams for proper oral hygiene  Community Resource Referral / Chronic Care Management: CRR required this visit?  No   CCM required this visit?  No      Plan:     I have personally reviewed and noted the following in the patient's chart:   Medical and social history Use of alcohol, tobacco or illicit drugs  Current medications and supplements including opioid prescriptions. Patient is not currently taking opioid prescriptions. Functional ability and status Nutritional status Physical activity Advanced directives List of other physicians Hospitalizations, surgeries, and ER visits in previous 12 months Vitals Screenings to include cognitive, depression, and falls Referrals and appointments  In addition, I have reviewed and discussed with patient certain preventive protocols, quality metrics, and best practice recommendations. A written personalized care plan for preventive services as well as general preventive health recommendations were provided to patient.     Vanetta Mulders, [place]   04/17/4966   Due to this being a virtual visit, the after visit summary with patients personalized plan was offered to patient via mail or my-chart.  Patient would like to access on my-chart  Nurse Notes: No concerns

## 2022-09-14 NOTE — Patient Instructions (Signed)
Megan [name] , Thank you for taking time to come for your Medicare Wellness Visit. I appreciate your ongoing commitment to your health goals. Please review the following plan we discussed and let me know if I can assist you in the future.   These are the goals we discussed:  Goals      Exercise 3x per week (30 min per time)     Try to exercise more. "Get a kidney"        This is a list of the screening recommended for you and due dates:  Health Maintenance  Topic Date Due   Hepatitis C Screening: USPSTF Recommendation to screen - Ages 58-79 yo.  Never done   Zoster (Shingles) Vaccine (1 of 2) Never done   DTaP/Tdap/Td vaccine (2 - Tdap) 07/21/2015   Eye exam for diabetics  09/15/2015   Complete foot exam   08/26/2016   Hemoglobin A1C  05/27/2021   Mammogram  09/01/2021   COVID-19 Vaccine (5 - 2023-24 season) 04/14/2022   Pap Smear  06/04/2022   Medicare Annual Wellness Visit  09/15/2023   Colon Cancer Screening  06/17/2031   Flu Shot  Completed   HIV Screening  Completed   HPV Vaccine  Aged Out    Advanced directives: Please bring a copy of your health care power of attorney and living will to the office to be added to your chart at your convenience.  Conditions/risks identified: Aim for 30 minutes of exercise or brisk walking, 6-8 glasses of water, and 5 servings of fruits and vegetables each day.  Next appointment: Follow up in one year for your annual wellness visit.   Preventive Care 40-64 Years, Female Preventive care refers to lifestyle choices and visits with your health care provider that can promote health and wellness. What does preventive care include? A yearly physical exam. This is also called an annual well check. Dental exams once or twice a year. Routine eye exams. Ask your health care provider how often you should have your eyes checked. Personal lifestyle choices, including: Daily care of your teeth and gums. Regular physical activity. Eating a  healthy diet. Avoiding tobacco and drug use. Limiting alcohol use. Practicing safe sex. Taking low-dose aspirin daily starting at age 43. Taking vitamin and mineral supplements as recommended by your health care provider. What happens during an annual well check? The services and screenings done by your health care provider during your annual well check will depend on your age, overall health, lifestyle risk factors, and family history of disease. Counseling  Your health care provider may ask you questions about your: Alcohol use. Tobacco use. Drug use. Emotional well-being. Home and relationship well-being. Sexual activity. Eating habits. Work and work Statistician. Method of birth control. Menstrual cycle. Pregnancy history. Screening  You may have the following tests or measurements: Height, weight, and BMI. Blood pressure. Lipid and cholesterol levels. These may be checked every 5 years, or more frequently if you are over 70 years old. Skin check. Lung cancer screening. You may have this screening every year starting at age 26 if you have a 30-pack-year history of smoking and currently smoke or have quit within the past 15 years. Fecal occult blood test (FOBT) of the stool. You may have this test every year starting at age 61. Flexible sigmoidoscopy or colonoscopy. You may have a sigmoidoscopy every 5 years or a colonoscopy every 10 years starting at age 19. Hepatitis C blood test. Hepatitis B blood test. Sexually transmitted disease (  STD) testing. Diabetes screening. This is done by checking your blood sugar (glucose) after you have not eaten for a while (fasting). You may have this done every 1-3 years. Mammogram. This may be done every 1-2 years. Talk to your health care provider about when you should start having regular mammograms. This may depend on whether you have a family history of breast cancer. BRCA-related cancer screening. This may be done if you have a family  history of breast, ovarian, tubal, or peritoneal cancers. Pelvic exam and Pap test. This may be done every 3 years starting at age 25. Starting at age 85, this may be done every 5 years if you have a Pap test in combination with an HPV test. Bone density scan. This is done to screen for osteoporosis. You may have this scan if you are at high risk for osteoporosis. Discuss your test results, treatment options, and if necessary, the need for more tests with your health care provider. Vaccines  Your health care provider may recommend certain vaccines, such as: Influenza vaccine. This is recommended every year. Tetanus, diphtheria, and acellular pertussis (Tdap, Td) vaccine. You may need a Td booster every 10 years. Zoster vaccine. You may need this after age 59. Pneumococcal 13-valent conjugate (PCV13) vaccine. You may need this if you have certain conditions and were not previously vaccinated. Pneumococcal polysaccharide (PPSV23) vaccine. You may need one or two doses if you smoke cigarettes or if you have certain conditions. Talk to your health care provider about which screenings and vaccines you need and how often you need them. This information is not intended to replace advice given to you by your health care provider. Make sure you discuss any questions you have with your health care provider. Document Released: 08/27/2015 Document Revised: 04/19/2016 Document Reviewed: 06/01/2015 Elsevier Interactive Patient Education  2017 [place] Prevention in the Home Falls can cause injuries. They can happen to people of all ages. There are many things you can do to make your home safe and to help prevent falls. What can I do on the outside of my home? Regularly fix the edges of walkways and driveways and fix any cracks. Remove anything that might make you trip as you walk through a door, such as a raised step or threshold. Trim any bushes or trees on the path to your home. Use bright  outdoor lighting. Clear any walking paths of anything that might make someone trip, such as rocks or tools. Regularly check to see if handrails are loose or broken. Make sure that both sides of any steps have handrails. Any raised decks and porches should have guardrails on the edges. Have any leaves, snow, or ice cleared regularly. Use sand or salt on walking paths during winter. Clean up any spills in your garage right away. This includes oil or grease spills. What can I do in the bathroom? Use night lights. Install grab bars by the toilet and in the tub and shower. Do not use towel bars as grab bars. Use non-skid mats or decals in the tub or shower. If you need to sit down in the shower, use a plastic, non-slip stool. Keep the floor dry. Clean up any water that spills on the floor as soon as it happens. Remove soap buildup in the tub or shower regularly. Attach bath mats securely with double-sided non-slip rug tape. Do not have throw rugs and other things on the floor that can make you trip. What can I  do in the bedroom? Use night lights. Make sure that you have a light by your bed that is easy to reach. Do not use any sheets or blankets that are too big for your bed. They should not hang down onto the floor. Have a firm chair that has side arms. You can use this for support while you get dressed. Do not have throw rugs and other things on the floor that can make you trip. What can I do in the kitchen? Clean up any spills right away. Avoid walking on wet floors. Keep items that you use a lot in easy-to-reach places. If you need to reach something above you, use a strong step stool that has a grab bar. Keep electrical cords out of the way. Do not use floor polish or wax that makes floors slippery. If you must use wax, use non-skid floor wax. Do not have throw rugs and other things on the floor that can make you trip. What can I do with my stairs? Do not leave any items on the  stairs. Make sure that there are handrails on both sides of the stairs and use them. Fix handrails that are broken or loose. Make sure that handrails are as long as the stairways. Check any carpeting to make sure that it is firmly attached to the stairs. Fix any carpet that is loose or worn. Avoid having throw rugs at the top or bottom of the stairs. If you do have throw rugs, attach them to the floor with carpet tape. Make sure that you have a light switch at the top of the stairs and the bottom of the stairs. If you do not have them, ask someone to add them for you. What else can I do to help prevent falls? Wear shoes that: Do not have high heels. Have rubber bottoms. Are comfortable and fit you well. Are closed at the toe. Do not wear sandals. If you use a stepladder: Make sure that it is fully opened. Do not climb a closed stepladder. Make sure that both sides of the stepladder are locked into place. Ask someone to hold it for you, if possible. Clearly mark and make sure that you can see: Any grab bars or handrails. First and last steps. Where the edge of each step is. Use tools that help you move around (mobility aids) if they are needed. These include: Canes. Walkers. Scooters. Crutches. Turn on the lights when you go into a dark area. Replace any light bulbs as soon as they burn out. Set up your furniture so you have a clear path. Avoid moving your furniture around. If any of your floors are uneven, fix them. If there are any pets around you, be aware of where they are. Review your medicines with your doctor. Some medicines can make you feel dizzy. This can increase your chance of falling. Ask your doctor what other things that you can do to help prevent falls. This information is not intended to replace advice given to you by your health care provider. Make sure you discuss any questions you have with your health care provider. Document Released: 05/27/2009 Document Revised:  01/06/2016 Document Reviewed: 09/04/2014 Elsevier Interactive Patient Education  2017 [employer].

## 2022-09-15 DIAGNOSIS — Z992 Dependence on renal dialysis: Secondary | ICD-10-CM | POA: Diagnosis not present

## 2022-09-15 DIAGNOSIS — E1129 Type 2 diabetes mellitus with other diabetic kidney complication: Secondary | ICD-10-CM | POA: Diagnosis not present

## 2022-09-15 DIAGNOSIS — N186 End stage renal disease: Secondary | ICD-10-CM | POA: Diagnosis not present

## 2022-09-15 DIAGNOSIS — D631 Anemia in chronic kidney disease: Secondary | ICD-10-CM | POA: Diagnosis not present

## 2022-09-15 DIAGNOSIS — I159 Secondary hypertension, unspecified: Secondary | ICD-10-CM | POA: Diagnosis not present

## 2022-09-15 DIAGNOSIS — N2581 Secondary hyperparathyroidism of renal origin: Secondary | ICD-10-CM | POA: Diagnosis not present

## 2022-09-15 DIAGNOSIS — E119 Type 2 diabetes mellitus without complications: Secondary | ICD-10-CM | POA: Diagnosis not present

## 2022-09-19 DIAGNOSIS — I159 Secondary hypertension, unspecified: Secondary | ICD-10-CM | POA: Diagnosis not present

## 2022-09-19 DIAGNOSIS — N186 End stage renal disease: Secondary | ICD-10-CM | POA: Diagnosis not present

## 2022-09-19 DIAGNOSIS — D631 Anemia in chronic kidney disease: Secondary | ICD-10-CM | POA: Diagnosis not present

## 2022-09-19 DIAGNOSIS — E1129 Type 2 diabetes mellitus with other diabetic kidney complication: Secondary | ICD-10-CM | POA: Diagnosis not present

## 2022-09-19 DIAGNOSIS — Z992 Dependence on renal dialysis: Secondary | ICD-10-CM | POA: Diagnosis not present

## 2022-09-19 DIAGNOSIS — E119 Type 2 diabetes mellitus without complications: Secondary | ICD-10-CM | POA: Diagnosis not present

## 2022-09-20 ENCOUNTER — Telehealth: Payer: Self-pay

## 2022-09-20 DIAGNOSIS — D631 Anemia in chronic kidney disease: Secondary | ICD-10-CM | POA: Diagnosis not present

## 2022-09-20 DIAGNOSIS — I159 Secondary hypertension, unspecified: Secondary | ICD-10-CM | POA: Diagnosis not present

## 2022-09-20 DIAGNOSIS — E1129 Type 2 diabetes mellitus with other diabetic kidney complication: Secondary | ICD-10-CM | POA: Diagnosis not present

## 2022-09-20 DIAGNOSIS — E119 Type 2 diabetes mellitus without complications: Secondary | ICD-10-CM | POA: Diagnosis not present

## 2022-09-20 DIAGNOSIS — N186 End stage renal disease: Secondary | ICD-10-CM | POA: Diagnosis not present

## 2022-09-20 DIAGNOSIS — Z992 Dependence on renal dialysis: Secondary | ICD-10-CM | POA: Diagnosis not present

## 2022-09-20 NOTE — Telephone Encounter (Signed)
        Patient  visited [facility] on 09/12/2022  for Shortness of Breath  Chest Pain.   Telephone encounter attempt :  1st  A HIPAA compliant voice message was left requesting a return call.  Instructed patient to call back at [phone].   [place] Resource Care Guide   ??[email or web address]  ?? [phone]   Website: [email or web address]  [email or web address]

## 2022-09-21 ENCOUNTER — Telehealth: Payer: Self-pay

## 2022-09-21 NOTE — Telephone Encounter (Signed)
        Patient  visited [facility] on 09/12/2022  for Shortness of Breath  Chest Pain   Telephone encounter attempt :  2nd  A HIPAA compliant voice message was left requesting a return call.  Instructed patient to call back at [phone].   [place] Resource Care Guide   [email or web address]  ?? [phone]   Website: [email or web address]  [email or web address]

## 2022-09-22 DIAGNOSIS — D631 Anemia in chronic kidney disease: Secondary | ICD-10-CM | POA: Diagnosis not present

## 2022-09-22 DIAGNOSIS — I159 Secondary hypertension, unspecified: Secondary | ICD-10-CM | POA: Diagnosis not present

## 2022-09-22 DIAGNOSIS — E1129 Type 2 diabetes mellitus with other diabetic kidney complication: Secondary | ICD-10-CM | POA: Diagnosis not present

## 2022-09-22 DIAGNOSIS — Z992 Dependence on renal dialysis: Secondary | ICD-10-CM | POA: Diagnosis not present

## 2022-09-22 DIAGNOSIS — N186 End stage renal disease: Secondary | ICD-10-CM | POA: Diagnosis not present

## 2022-09-22 DIAGNOSIS — E119 Type 2 diabetes mellitus without complications: Secondary | ICD-10-CM | POA: Diagnosis not present

## 2022-09-25 ENCOUNTER — Other Ambulatory Visit: Payer: Self-pay | Admitting: Family Medicine

## 2022-09-25 DIAGNOSIS — D631 Anemia in chronic kidney disease: Secondary | ICD-10-CM | POA: Diagnosis not present

## 2022-09-25 DIAGNOSIS — N186 End stage renal disease: Secondary | ICD-10-CM | POA: Diagnosis not present

## 2022-09-25 DIAGNOSIS — I159 Secondary hypertension, unspecified: Secondary | ICD-10-CM | POA: Diagnosis not present

## 2022-09-25 DIAGNOSIS — E1129 Type 2 diabetes mellitus with other diabetic kidney complication: Secondary | ICD-10-CM | POA: Diagnosis not present

## 2022-09-25 DIAGNOSIS — E119 Type 2 diabetes mellitus without complications: Secondary | ICD-10-CM | POA: Diagnosis not present

## 2022-09-25 DIAGNOSIS — Z1231 Encounter for screening mammogram for malignant neoplasm of breast: Secondary | ICD-10-CM

## 2022-09-25 DIAGNOSIS — Z992 Dependence on renal dialysis: Secondary | ICD-10-CM | POA: Diagnosis not present

## 2022-09-26 ENCOUNTER — Ambulatory Visit (INDEPENDENT_AMBULATORY_CARE_PROVIDER_SITE_OTHER): Payer: Medicare Other | Admitting: General Surgery

## 2022-09-26 ENCOUNTER — Other Ambulatory Visit: Payer: Self-pay | Admitting: *Deleted

## 2022-09-26 ENCOUNTER — Encounter: Payer: Self-pay | Admitting: General Surgery

## 2022-09-26 ENCOUNTER — Telehealth: Payer: Self-pay

## 2022-09-26 VITALS — BP 162/90 | HR 85 | Temp 98.7°F | Resp 14 | Ht 62.0 in | Wt 182.0 lb

## 2022-09-26 DIAGNOSIS — M7989 Other specified soft tissue disorders: Secondary | ICD-10-CM | POA: Diagnosis not present

## 2022-09-26 MED ORDER — HYDROCODONE-ACETAMINOPHEN 5-325 MG PO TABS: 1.0000 | ORAL_TABLET | Freq: Four times a day (QID) | ORAL | 0 refills | Status: DC | PRN

## 2022-09-26 NOTE — Telephone Encounter (Signed)
        Patient  visited [facility] on 09/12/2022  for Shortness of Breath  Chest Pain.   Telephone encounter attempt :  3rd  A HIPAA compliant voice message was left requesting a return call.  Instructed patient to call back at [phone].   [place] Resource Care Guide   [email or web address]  ?? [phone]   Website: [email or web address]  [email or web address]

## 2022-09-26 NOTE — Progress Notes (Signed)
Subjective:     Megan [name]  Patient is a 62 year old black female well-known to me with multiple medical problems, status post a right modified radical mastectomy for breast cancer in 2021 and a prophylactic left simple mastectomy also in 2021 who now presents with significant left arm swelling with swelling of the anterior portion of the left axilla.  She states she has had this for many months.  She also is dialyzed through a fistula in the left arm.  It is causing her pain and tingling in her hand. Objective:    BP (!) 162/90   Pulse 85   Temp 98.7 F (37.1 C) (Oral)   Resp 14   Ht 5' 2"$  (1.575 m)   Wt 182 lb (82.6 kg)   SpO2 94%   BMI 33.29 kg/m   General:  alert, cooperative, and no distress  Head is normocephalic, atraumatic Lungs clear to auscultation with equal breath sounds bilaterally Heart examination reveals regular rate rhythm without S3, S4, murmurs Right mastectomy incision site healing well.  A small soft subcutaneous nodules present towards the right axilla which has not changed in size in over a year.  It is nontender.  Left mastectomy site healing well.  She does have an increase density of the subcutaneous tissue along the anterior aspect of the axilla over the pectoralis major muscle.  I do not feel any lymphadenopathy.  Her left arm is swollen all the way down to her hand.  A fistula is in place in the left forearm.     Assessment:    Left arm swelling of unknown etiology.  I did not do a left axillary dissection at the time of her left simple mastectomy.  The swelling is possibly multifactorial including the fact that she is getting dialyzed in the left arm and has a history of right breast cancer.    Plan:   Will get an ultrasound of her left axilla to further evaluate for lymphadenopathy.  Further management is pending those results.  She may need to see vascular surgery if this is negative.  Follow-up here after the ultrasound results.

## 2022-09-26 NOTE — Telephone Encounter (Signed)
Patient seen in office at [facility] by Dr. Aviva Signs for Left arm swelling. [place] ordered and scheduled.   While in office, patient requested refill on Hydrocodone for chest/ axillary pain S/P mastectomy. Noted last prescription was given by PCP.   OK to refill? Last refill 06/20/2022, # 20 tabs.

## 2022-09-27 DIAGNOSIS — N186 End stage renal disease: Secondary | ICD-10-CM | POA: Diagnosis not present

## 2022-09-27 DIAGNOSIS — E119 Type 2 diabetes mellitus without complications: Secondary | ICD-10-CM | POA: Diagnosis not present

## 2022-09-27 DIAGNOSIS — D631 Anemia in chronic kidney disease: Secondary | ICD-10-CM | POA: Diagnosis not present

## 2022-09-27 DIAGNOSIS — I159 Secondary hypertension, unspecified: Secondary | ICD-10-CM | POA: Diagnosis not present

## 2022-09-27 DIAGNOSIS — E1129 Type 2 diabetes mellitus with other diabetic kidney complication: Secondary | ICD-10-CM | POA: Diagnosis not present

## 2022-09-27 DIAGNOSIS — Z992 Dependence on renal dialysis: Secondary | ICD-10-CM | POA: Diagnosis not present

## 2022-09-29 DIAGNOSIS — Z992 Dependence on renal dialysis: Secondary | ICD-10-CM | POA: Diagnosis not present

## 2022-09-29 DIAGNOSIS — E119 Type 2 diabetes mellitus without complications: Secondary | ICD-10-CM | POA: Diagnosis not present

## 2022-09-29 DIAGNOSIS — N186 End stage renal disease: Secondary | ICD-10-CM | POA: Diagnosis not present

## 2022-09-29 DIAGNOSIS — E1129 Type 2 diabetes mellitus with other diabetic kidney complication: Secondary | ICD-10-CM | POA: Diagnosis not present

## 2022-09-29 DIAGNOSIS — I159 Secondary hypertension, unspecified: Secondary | ICD-10-CM | POA: Diagnosis not present

## 2022-09-29 DIAGNOSIS — D631 Anemia in chronic kidney disease: Secondary | ICD-10-CM | POA: Diagnosis not present

## 2022-10-02 DIAGNOSIS — Z992 Dependence on renal dialysis: Secondary | ICD-10-CM | POA: Diagnosis not present

## 2022-10-02 DIAGNOSIS — I159 Secondary hypertension, unspecified: Secondary | ICD-10-CM | POA: Diagnosis not present

## 2022-10-02 DIAGNOSIS — D631 Anemia in chronic kidney disease: Secondary | ICD-10-CM | POA: Diagnosis not present

## 2022-10-02 DIAGNOSIS — E119 Type 2 diabetes mellitus without complications: Secondary | ICD-10-CM | POA: Diagnosis not present

## 2022-10-02 DIAGNOSIS — N186 End stage renal disease: Secondary | ICD-10-CM | POA: Diagnosis not present

## 2022-10-02 DIAGNOSIS — E1129 Type 2 diabetes mellitus with other diabetic kidney complication: Secondary | ICD-10-CM | POA: Diagnosis not present

## 2022-10-04 DIAGNOSIS — N186 End stage renal disease: Secondary | ICD-10-CM | POA: Diagnosis not present

## 2022-10-04 DIAGNOSIS — I159 Secondary hypertension, unspecified: Secondary | ICD-10-CM | POA: Diagnosis not present

## 2022-10-04 DIAGNOSIS — Z992 Dependence on renal dialysis: Secondary | ICD-10-CM | POA: Diagnosis not present

## 2022-10-04 DIAGNOSIS — D631 Anemia in chronic kidney disease: Secondary | ICD-10-CM | POA: Diagnosis not present

## 2022-10-04 DIAGNOSIS — E1129 Type 2 diabetes mellitus with other diabetic kidney complication: Secondary | ICD-10-CM | POA: Diagnosis not present

## 2022-10-04 DIAGNOSIS — E039 Hypothyroidism, unspecified: Secondary | ICD-10-CM | POA: Diagnosis not present

## 2022-10-04 DIAGNOSIS — E119 Type 2 diabetes mellitus without complications: Secondary | ICD-10-CM | POA: Diagnosis not present

## 2022-10-05 ENCOUNTER — Ambulatory Visit (HOSPITAL_COMMUNITY)
Admission: RE | Admit: 2022-10-05 | Discharge: 2022-10-05 | Disposition: A | Discharge status: Home / Self Care | Payer: Medicare Other | Source: Ambulatory Visit | Attending: General Surgery | Admitting: General Surgery

## 2022-10-05 DIAGNOSIS — M7989 Other specified soft tissue disorders: Secondary | ICD-10-CM | POA: Diagnosis not present

## 2022-10-05 DIAGNOSIS — R2232 Localized swelling, mass and lump, left upper limb: Secondary | ICD-10-CM | POA: Diagnosis not present

## 2022-10-06 ENCOUNTER — Other Ambulatory Visit: Payer: Self-pay

## 2022-10-06 DIAGNOSIS — N186 End stage renal disease: Secondary | ICD-10-CM | POA: Diagnosis not present

## 2022-10-06 DIAGNOSIS — I10 Essential (primary) hypertension: Secondary | ICD-10-CM

## 2022-10-06 DIAGNOSIS — E119 Type 2 diabetes mellitus without complications: Secondary | ICD-10-CM | POA: Diagnosis not present

## 2022-10-06 DIAGNOSIS — I159 Secondary hypertension, unspecified: Secondary | ICD-10-CM | POA: Diagnosis not present

## 2022-10-06 DIAGNOSIS — Z79899 Other long term (current) drug therapy: Secondary | ICD-10-CM

## 2022-10-06 DIAGNOSIS — E1129 Type 2 diabetes mellitus with other diabetic kidney complication: Secondary | ICD-10-CM | POA: Diagnosis not present

## 2022-10-06 DIAGNOSIS — Z992 Dependence on renal dialysis: Secondary | ICD-10-CM | POA: Diagnosis not present

## 2022-10-06 DIAGNOSIS — D631 Anemia in chronic kidney disease: Secondary | ICD-10-CM | POA: Diagnosis not present

## 2022-10-06 DIAGNOSIS — E1122 Type 2 diabetes mellitus with diabetic chronic kidney disease: Secondary | ICD-10-CM

## 2022-10-06 DIAGNOSIS — E7800 Pure hypercholesterolemia, unspecified: Secondary | ICD-10-CM

## 2022-10-09 ENCOUNTER — Telehealth: Payer: Self-pay | Admitting: Pharmacist

## 2022-10-09 DIAGNOSIS — E1129 Type 2 diabetes mellitus with other diabetic kidney complication: Secondary | ICD-10-CM | POA: Diagnosis not present

## 2022-10-09 DIAGNOSIS — I159 Secondary hypertension, unspecified: Secondary | ICD-10-CM | POA: Diagnosis not present

## 2022-10-09 DIAGNOSIS — Z992 Dependence on renal dialysis: Secondary | ICD-10-CM | POA: Diagnosis not present

## 2022-10-09 DIAGNOSIS — D631 Anemia in chronic kidney disease: Secondary | ICD-10-CM | POA: Diagnosis not present

## 2022-10-09 DIAGNOSIS — N186 End stage renal disease: Secondary | ICD-10-CM | POA: Diagnosis not present

## 2022-10-09 DIAGNOSIS — E119 Type 2 diabetes mellitus without complications: Secondary | ICD-10-CM | POA: Diagnosis not present

## 2022-10-09 NOTE — Progress Notes (Signed)
Care Management & Coordination Services Pharmacy Team  Reason for Encounter: Appointment Reminder  Contacted patient to confirm telephone appointment with Leata Mouse, PharmD on 10/10/2022 at 9 am. Spoke with patient on 10/09/2022   Do you have any problems getting your medications? No If yes what types of problems are you experiencing?  None  What is your top health concern you would like to discuss at your upcoming visit? Dialysis, wants a kidney transplant.  Have you seen any other providers since your last visit with PCP? No   Chart review:  Recent office visits:  06/05/2022 OV (PCP) [name], MD; no medication changes indicated.  Recent consult visits:  09/26/2022 [facility] (Gen Surgery) Aviva Signs, MD; no medication changes indicated.  09/13/2022 OV (Cardiology) Arnoldo Lenis, MD; Try increasing imdur to '45mg'$  daily   07/13/2022 OV (Oncology) Derek Jack, MD; Add vitamin D 400 international units daily.   07/11/2022 OV [facility]ie [facility]ey) Annitta Needs, NP; no medication changes indicated.  05/01/2022 OV (Gastro) Mahon, Courtney L, NP; no medication changes indicated.  04/18/2022 OV (Vasc Surg) Arvilla Market, Corrina, PA-C; no medication indicated.  Hospital visits:  09/12/2022 ED visit for Chest Pain -sucralfate 1gm/10 mL 3 times daily with meals  08/15/2022 ED visit for Chest Pain -Rx Augmentin 875-125 mg every 12 hrs. -Rx Zithromax 250 mg daily  04/27/2022 ED visit for Bleeding hemorrhoids -No medication changes  Star Rating Drugs:  Atorvastatin 20 mg last filled 09/12/2022 90 DS   Care Gaps: Annual wellness visit in last year? Yes  If Diabetic: Last eye exam / retinopathy screening: 09/14/2014 Last diabetic foot exam: 08/27/2015   Future Appointments  Date Time Provider [place]  10/10/2022  9:00 AM Edythe Clarity, [employer] None  01/09/2023  8:30 AM [facility] None  01/16/2023  8:00 AM Derek Jack, MD [facility] None   02/28/2023  3:20 PM Arnoldo Lenis, MD [facility]  09/20/2023 11:30 AM [facility]-NURSE HEALTH ADVISOR [facility]   April D Calhoun, [place] Pharmacist Assistant [phone]

## 2022-10-10 ENCOUNTER — Telehealth (INDEPENDENT_AMBULATORY_CARE_PROVIDER_SITE_OTHER): Payer: Medicare Other | Admitting: General Surgery

## 2022-10-10 ENCOUNTER — Ambulatory Visit: Payer: Medicare Other | Admitting: Pharmacist

## 2022-10-10 DIAGNOSIS — M7989 Other specified soft tissue disorders: Secondary | ICD-10-CM

## 2022-10-10 DIAGNOSIS — Z09 Encounter for follow-up examination after completed treatment for conditions other than malignant neoplasm: Secondary | ICD-10-CM

## 2022-10-10 NOTE — Progress Notes (Signed)
Care Management & Coordination Services Pharmacy Note  10/10/2022 Name:  Megan [name] MRN:  [id number] DOB:  [birth year]  Summary: Initial visit with PharmD.  Patient doing well with meds despite dialysis MWF.  She has no concerns with cost of meds due to [employer].  Biggest complaint today is her neuropathy.  Just had referral to foot doctor for second opinion.  Cannot tolerate increases in gabapentin due to kidneys.  Recommendations/Changes made from today's visit: No changes Due for updated DEXA scan in August 2022  Follow up plan: FU 6 months   Subjective: Megan [name] is an 62 y.o. year old female who is a primary patient of Pickard, Cammie Mcgee, MD.  The care coordination team was consulted for assistance with disease management and care coordination needs.    Engaged with patient by telephone for initial visit.  Recent office visits:  06/05/2022 OV (PCP) [name], MD; no medication changes indicated.   Recent consult visits:  09/26/2022 OV (Gen Surgery) Aviva Signs, MD; no medication changes indicated.   09/13/2022 OV (Cardiology) Arnoldo Lenis, MD; Try increasing imdur to '45mg'$  daily    07/13/2022 OV (Oncology) Derek Jack, MD; Add vitamin D 400 international units daily.    07/11/2022 OV [facility]) Annitta Needs, NP; no medication changes indicated.   05/01/2022 OV (Gastro) Mahon, Courtney L, NP; no medication changes indicated.   04/18/2022 OV (Vasc Surg) Arvilla Market, Corrina, PA-C; no medication indicated.   Hospital visits:  09/12/2022 ED visit for Chest Pain -sucralfate 1gm/10 mL 3 times daily with meals   08/15/2022 ED visit for Chest Pain -Rx Augmentin 875-125 mg every 12 hrs. -Rx Zithromax 250 mg daily   04/27/2022 ED visit for Bleeding hemorrhoids -No medication changes   Star Rating Drugs:  Atorvastatin 20 mg last filled 09/12/2022 90 DS     Care Gaps: Annual wellness visit in last year? Yes   If Diabetic: Last eye exam /  retinopathy screening: 09/14/2014 Last diabetic foot exam: 08/27/2015   Objective:  Lab Results  Component Value Date   CREATININE 10.35 (H) 09/12/2022   BUN 46 (H) 09/12/2022   EGFR 7 (L) 08/26/2021   GFRNONAA 4 (L) 09/12/2022   GFRAA 5 (L) 11/25/2020   NA 138 09/12/2022   K 3.8 09/12/2022   CALCIUM 9.8 09/12/2022   CO2 27 09/12/2022   GLUCOSE 96 09/12/2022    Lab Results  Component Value Date/Time   HGBA1C 5.5 11/25/2020 09:40 AM   HGBA1C 5.8 (H) 02/03/2019 04:43 AM   MICROALBUR 67.39 (H) 03/17/2014 03:57 PM    Last diabetic Eye exam: No results found for: "HMDIABEYEEXA"  Last diabetic Foot exam: No results found for: "HMDIABFOOTEX"   Lab Results  Component Value Date   CHOL 141 11/25/2020   HDL 55 11/25/2020   LDLCALC 72 11/25/2020   LDLDIRECT 105 (H) 03/02/2022   TRIG 67 11/25/2020   CHOLHDL 2.6 11/25/2020       Latest Ref Rng & Units 09/12/2022   11:20 AM 07/04/2022    8:02 AM 04/27/2022    4:30 PM  Hepatic Function  Total Protein 6.5 - 8.1 g/dL 7.3  8.3  7.8   Albumin 3.5 - 5.0 g/dL 3.5  4.0  3.8   AST 15 - 41 U/L '26  19  18   '$ ALT 0 - 44 U/L '27  22  24   '$ Alk Phosphatase 38 - 126 U/L 90  94  85   Total Bilirubin 0.3 - 1.2  mg/dL 0.7  0.8  0.7   Bilirubin, Direct 0.0 - 0.2 mg/dL 0.1       Lab Results  Component Value Date/Time   TSH 1.837 02/03/2019 04:43 AM   TSH 1.588 08/31/2014 08:15 AM   TSH 0.852 04/22/2010 12:44 AM       Latest Ref Rng & Units 09/12/2022   11:20 AM 08/15/2022   10:45 AM 07/04/2022    8:02 AM  CBC  WBC 4.0 - 10.5 K/uL 7.6  8.7  8.4   Hemoglobin 12.0 - 15.0 g/dL 11.2  10.6  11.9   Hematocrit 36.0 - 46.0 % 34.6  32.2  36.4   Platelets 150 - 400 K/uL 269  273  273     Lab Results  Component Value Date/Time   VD25OH 19.34 (L) 07/04/2022 08:02 AM   VD25OH 15.89 (L) 01/03/2022 09:24 AM   VITAMINB12 1,226 (H) 12/10/2018 05:47 AM   VITAMINB12 516 04/03/2017 10:19 AM    Clinical ASCVD: No  The 10-year ASCVD risk score (Arnett  DK, et al., 2019) is: 21.7%   Values used to calculate the score:     Age: 58 years     Sex: Female     Is Non-Hispanic African American: Yes     Diabetic: Yes     Tobacco smoker: No     Systolic Blood Pressure: 0000000 mmHg     Is BP treated: Yes     HDL Cholesterol: 55 mg/dL     Total Cholesterol: 141 mg/dL        09/14/2022    3:54 PM 09/09/2021   11:57 AM 04/05/2021    8:55 AM  Depression screen PHQ 2/9  Decreased Interest 0 0 0  Down, Depressed, Hopeless 0 0 0  PHQ - 2 Score 0 0 0     Social History   Tobacco Use  Smoking Status Never   Passive exposure: Never  Smokeless Tobacco Never   BP Readings from Last 3 Encounters:  09/26/22 (!) 162/90  09/13/22 110/70  09/12/22 135/70   Pulse Readings from Last 3 Encounters:  09/26/22 85  09/13/22 84  09/12/22 70   Wt Readings from Last 3 Encounters:  09/26/22 182 lb (82.6 kg)  09/14/22 179 lb (81.2 kg)  09/13/22 182 lb 6.4 oz (82.7 kg)   BMI Readings from Last 3 Encounters:  09/26/22 33.29 kg/m  09/14/22 32.74 kg/m  09/13/22 33.36 kg/m    No Known Allergies  Medications Reviewed Today     Reviewed by Edythe Clarity, [facility] (Pharmacist) on 10/10/22 at [phone]  Med List Status: <None>   Medication Order Taking? Sig Documenting Provider Last Dose Status Informant  amLODipine (NORVASC) 10 MG tablet [id number] Yes TAKE 1 TABLET BY MOUTH AT BEDTIME [name], MD Taking Active   anastrozole (ARIMIDEX) 1 MG tablet [id number] Yes Take 1 tablet (1 mg total) by mouth daily. Derek Jack, MD Taking Active Self, Pharmacy Records  aspirin EC 81 MG tablet [id number] Yes Take 81 mg by mouth daily. [provider] Taking Active Self, Pharmacy Records  atorvastatin (LIPITOR) 20 MG tablet [id number] Yes Take 1 tablet (20 mg total) by mouth daily. TAKE 1 TABLET DAILY [name], MD Taking Active Self, Pharmacy Records  B Complex-C-Zn-Folic Acid (DIALYVITE [id number]) 0.8 MG TABS [id number] Yes Take 1  tablet by mouth daily. [provider] Taking Active Self, Pharmacy Records  cinacalcet (SENSIPAR) 30 MG tablet [id number] Yes Take 2 tablets (60 mg total) by  mouth daily.  Patient taking differently: Take 30 mg by mouth daily.   [name], MD Taking Active Self, Pharmacy Records  Darbepoetin Alfa Kyra [place], ALBUMIN FREE, IJ) [id number] Yes Darbepoetin Alfa (Aranesp) [provider] Taking Active Self, Pharmacy Records  DULoxetine (CYMBALTA) 30 MG capsule [id number] Yes TAKE 1 CAPSULE(30 MG) BY MOUTH DAILY  Patient taking differently: Take 30 mg by mouth daily.   [name], MD Taking Active Self, Pharmacy Records  gabapentin (NEURONTIN) 300 MG capsule [id number] Yes TAKE 1 CAPSULE(300 MG) BY MOUTH AT BEDTIME [name], MD Taking Active Self, Pharmacy Records  HYDROcodone-acetaminophen (NORCO/VICODIN) 5-325 MG tablet [id number] Yes Take 1 tablet by mouth every 6 (six) hours as needed. [name], MD Taking Active   isosorbide mononitrate (IMDUR) 30 MG 24 hr tablet [id number] Yes Take 1.5 tablets (45 mg total) by mouth daily. Arnoldo Lenis, MD Taking Active   lanthanum (FOSRENOL) 1000 MG chewable tablet [id number] Yes Chew 2,000-3,000 mg by mouth See admin instructions. Take 3 tablets (3000 mg) by mouth with meals and take 2 tablets (2000 mg) with snacks [provider] Taking Active Self, Pharmacy Records           Med Note Vinie Sill Oct 05, 2020 11:02 AM)    lidocaine-prilocaine (EMLA) cream [id number] Yes Apply 1 application  topically every Monday, Wednesday, and Friday with hemodialysis. [provider] Taking Active Self, Pharmacy Records  meclizine (ANTIVERT) 25 MG tablet [id number] No TAKE 1 TABLET(25 MG) BY MOUTH THREE TIMES DAILY AS NEEDED FOR DIZZINESS  Patient not taking: Reported on 10/10/2022   [name], MD Not Taking Active Self, Pharmacy Records  nebivolol (BYSTOLIC) 10 MG tablet [id number] Yes Take 1  tablet (10 mg total) by mouth in the morning and at bedtime. [name], MD Taking Active Self, Pharmacy Records  nitroGLYCERIN (NITROSTAT) 0.4 MG SL tablet [id number] Yes Place 1 tablet (0.4 mg total) under the tongue every 5 (five) minutes x 3 doses as needed for chest pain (if no relief after 3rd dose, proceed to ED or call 911). Arnoldo Lenis, MD Taking Active   pantoprazole (PROTONIX) 40 MG tablet [id number] Yes TAKE 1 TABLET(40 MG) BY MOUTH DAILY [name], MD Taking Active   sucralfate (CARAFATE) 1 GM/10ML suspension [id number] No Take 10 mLs (1 g total) by mouth with breakfast, with lunch, and with evening meal.  Patient not taking: Reported on 10/10/2022   Godfrey Pick, MD Not Taking Active             SDOH:  (Social Determinants of Health) assessments and interventions performed: Yes Financial Resource Strain: Low Risk  (10/10/2022)   Overall Financial Resource Strain (CARDIA)    Difficulty of Paying Living Expenses: Not hard at all   Food Insecurity: No Food Insecurity (10/10/2022)   Hunger Vital Sign    Worried About Running Out of Food in the Last Year: Never true    Ran Out of Food in the Last Year: Never true    SDOH Interventions    Flowsheet Row Clinical Support from 09/14/2022 in [place] from 09/09/2021 in [place] Interventions    Food Insecurity Interventions Intervention Not Indicated Intervention Not Indicated  Housing Interventions Intervention Not Indicated Intervention Not Indicated  Transportation Interventions Intervention Not Indicated Intervention Not Indicated  Utilities Interventions Intervention Not Indicated --  Alcohol Usage Interventions Intervention Not Indicated (Score <  7) --  Financial Strain Interventions Intervention Not Indicated Intervention Not Indicated  Physical Activity Interventions Intervention Not Indicated Other (Comments)  Stress Interventions  Intervention Not Indicated Intervention Not Indicated  Social Connections Interventions Intervention Not Indicated Intervention Not Indicated       Medication Assistance: None required.  Patient affirms current coverage meets needs.  Medication Access: Within the past 30 days, how often has patient missed a dose of medication? 0 Is a pillbox or other method used to improve adherence? Yes  Factors that may affect medication adherence? no barriers identified Are meds synced by current pharmacy? No  Are meds delivered by current pharmacy? No  Does patient experience delays in picking up medications due to transportation concerns? No   [facility] Services Reviewed: Is patient disadvantaged to use [facility]?: No  Current Rx insurance plan: [employer] Name and location of Current pharmacy:  [facility], [place] [place] [address] Phone: [phone] Fax: [phone]  [facility] [place], [place] [address] [place] [address] Phone: [phone] Fax: [phone]  [facility] services reviewed with patient today?: Yes  Patient requests to transfer care to [facility]?: No  Reason patient declined to change pharmacies: Disadvantaged due to insurance/mail order  Compliance/Adherence/Medication fill history: Star Rating Drugs:  Atorvastatin 20 mg last filled 09/12/2022 90 DS     Care Gaps: Annual wellness visit in last year? Yes   If Diabetic: Last eye exam / retinopathy screening: 09/14/2014 Last diabetic foot exam: 08/27/2015   Assessment/Plan  Hypertension (BP goal <130/80) -Controlled -Current treatment: Nebivolol '10mg'$  Appropriate, Effective, Safe, Accessible Isosorbide mononitrate '30mg'$  24hr Appropriate, Effective, Safe, Accessible Amlodipine '10mg'$  Appropriate, Effective, Safe, Accessible -Medications  previously tried: none noted  -Current home readings: not really checking at home, checks 3x per week at dialysis -Denies hypotensive/hypertensive symptoms -Educated on BP goals and benefits of medications for prevention of heart attack, stroke and kidney damage; Daily salt intake goal < 2300 mg; Importance of home blood pressure monitoring; -Counseled to monitor BP at home at her dialysis, document, and provide log at future appointments -Recommended to continue current medication -Contact providers if BP is consistently > 130/80 at home or at dialysis  Hyperlipidemia: (LDL goal < 70) -Uncontrolled -Current treatment: Atorvastatin '20mg'$  Appropriate, Query effective, -Medications previously tried: none noted  -Educated on Cholesterol goals;  Benefits of statin for ASCVD risk reduction; Importance of limiting foods high in cholesterol; -Recommended to continue current medication -LDL above goal for patient with DM.  Could consider increasing intensity but no changes at this time - will monitor at next CPE and adjust.  Diabetes (A1c goal <7%) -Controlled -Current medications: None -Medications previously tried: glipizide, januvia  -Current home glucose readings -Denies hypoglycemic/hyperglycemic symptoms  -Educated on A1c and blood sugar goals; Complications of diabetes including kidney damage, retinal damage, and cardiovascular disease; -Counseled to check feet daily and get yearly eye exams - Continue current medication, due for A1c recheck, foot exam  Heart Failure (Goal: manage symptoms and prevent exacerbations) -Controlled  -HF type: HFpEF (EF > 50%)  -Current treatment: Nebivolol '10mg'$  BID Appropriate, Effective, Safe, Accessible -Medications previously tried: none ntoed  -Current home BP/HR readings: normal per patient -Current home daily weights: not monitoring  -Educated on Benefits of medications for managing symptoms and prolonging life Importance of weighing  daily;  if you gain more than 3 pounds in one day or 5 pounds in one week, contact providers -Recommended to continue current medication  Hx of Breast Cancer (Goal: Remission) -Controlled, hx of double mastectomy -Current treatment  Anastrazole '1mg'$  daily Appropriate, Effective, Safe, Accessible -Medications previously tried: none noted  -Recommended to continue current medication -Increased risk for osteoporosis due to anastrazole - keep DEXA up to date  Osteopenia (Goal Prevent fracture) -Controlled -Last DEXA Scan: 03/2021   T-Score femoral neck: -2.4  10-year probability of major osteoporotic fracture: 7.5%  10-year probability of hip fracture: 0.4% -Patient is not a candidate for pharmacologic treatment -Current treatment  None -Medications previously tried: none noted  -Recommend [phone] units of vitamin D daily. Recommend 1200 mg of calcium daily from dietary and supplemental sources. -History of treatment for breast cancer increases risk for osteoporosis.  She is due for updated DEXA scan in August of this year.  Beverly Milch, PharmD, CPP Clinical Pharmacist Practitioner [place] [phone]

## 2022-10-10 NOTE — Telephone Encounter (Signed)
Notified patient of the test results from the ultrasound of the left axilla.  There were no masses or lymphadenopathy seen.  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  Patient is relieved that it was nothing more extensive.  I told them that they should follow-up with the vascular surgeon.  I will send a note to Dr. Marval Regal with the results.

## 2022-10-11 DIAGNOSIS — D631 Anemia in chronic kidney disease: Secondary | ICD-10-CM | POA: Diagnosis not present

## 2022-10-11 DIAGNOSIS — N186 End stage renal disease: Secondary | ICD-10-CM | POA: Diagnosis not present

## 2022-10-11 DIAGNOSIS — E1129 Type 2 diabetes mellitus with other diabetic kidney complication: Secondary | ICD-10-CM | POA: Diagnosis not present

## 2022-10-11 DIAGNOSIS — Z992 Dependence on renal dialysis: Secondary | ICD-10-CM | POA: Diagnosis not present

## 2022-10-11 DIAGNOSIS — E119 Type 2 diabetes mellitus without complications: Secondary | ICD-10-CM | POA: Diagnosis not present

## 2022-10-11 DIAGNOSIS — I159 Secondary hypertension, unspecified: Secondary | ICD-10-CM | POA: Diagnosis not present

## 2022-10-12 ENCOUNTER — Encounter: Payer: Self-pay | Admitting: Radiology

## 2022-10-13 ENCOUNTER — Other Ambulatory Visit: Payer: Self-pay

## 2022-10-13 DIAGNOSIS — N186 End stage renal disease: Secondary | ICD-10-CM | POA: Diagnosis not present

## 2022-10-13 DIAGNOSIS — I159 Secondary hypertension, unspecified: Secondary | ICD-10-CM | POA: Diagnosis not present

## 2022-10-13 DIAGNOSIS — Z79899 Other long term (current) drug therapy: Secondary | ICD-10-CM

## 2022-10-13 DIAGNOSIS — E119 Type 2 diabetes mellitus without complications: Secondary | ICD-10-CM | POA: Diagnosis not present

## 2022-10-13 DIAGNOSIS — N040 Nephrotic syndrome with minor glomerular abnormality: Secondary | ICD-10-CM | POA: Diagnosis not present

## 2022-10-13 DIAGNOSIS — D631 Anemia in chronic kidney disease: Secondary | ICD-10-CM | POA: Diagnosis not present

## 2022-10-13 DIAGNOSIS — Z992 Dependence on renal dialysis: Secondary | ICD-10-CM | POA: Diagnosis not present

## 2022-10-13 DIAGNOSIS — E1129 Type 2 diabetes mellitus with other diabetic kidney complication: Secondary | ICD-10-CM | POA: Diagnosis not present

## 2022-10-13 DIAGNOSIS — N2581 Secondary hyperparathyroidism of renal origin: Secondary | ICD-10-CM | POA: Diagnosis not present

## 2022-10-14 ENCOUNTER — Other Ambulatory Visit: Payer: Self-pay | Admitting: Family Medicine

## 2022-10-16 ENCOUNTER — Other Ambulatory Visit: Payer: Self-pay | Admitting: Hematology

## 2022-10-16 ENCOUNTER — Other Ambulatory Visit: Payer: Self-pay | Admitting: Cardiology

## 2022-10-16 ENCOUNTER — Other Ambulatory Visit: Payer: Self-pay | Admitting: *Deleted

## 2022-10-16 DIAGNOSIS — Z992 Dependence on renal dialysis: Secondary | ICD-10-CM | POA: Diagnosis not present

## 2022-10-16 DIAGNOSIS — D631 Anemia in chronic kidney disease: Secondary | ICD-10-CM | POA: Diagnosis not present

## 2022-10-16 DIAGNOSIS — N2581 Secondary hyperparathyroidism of renal origin: Secondary | ICD-10-CM | POA: Diagnosis not present

## 2022-10-16 DIAGNOSIS — N186 End stage renal disease: Secondary | ICD-10-CM | POA: Diagnosis not present

## 2022-10-16 DIAGNOSIS — E1129 Type 2 diabetes mellitus with other diabetic kidney complication: Secondary | ICD-10-CM | POA: Diagnosis not present

## 2022-10-16 DIAGNOSIS — Z170 Estrogen receptor positive status [ER+]: Secondary | ICD-10-CM

## 2022-10-16 DIAGNOSIS — I159 Secondary hypertension, unspecified: Secondary | ICD-10-CM | POA: Diagnosis not present

## 2022-10-16 NOTE — Telephone Encounter (Signed)
Anastrozole refill approved.  Patient is tolerating and is to continue therapy.  

## 2022-10-18 DIAGNOSIS — Z992 Dependence on renal dialysis: Secondary | ICD-10-CM | POA: Diagnosis not present

## 2022-10-18 DIAGNOSIS — I159 Secondary hypertension, unspecified: Secondary | ICD-10-CM | POA: Diagnosis not present

## 2022-10-18 DIAGNOSIS — D631 Anemia in chronic kidney disease: Secondary | ICD-10-CM | POA: Diagnosis not present

## 2022-10-18 DIAGNOSIS — N2581 Secondary hyperparathyroidism of renal origin: Secondary | ICD-10-CM | POA: Diagnosis not present

## 2022-10-18 DIAGNOSIS — N186 End stage renal disease: Secondary | ICD-10-CM | POA: Diagnosis not present

## 2022-10-18 DIAGNOSIS — E1129 Type 2 diabetes mellitus with other diabetic kidney complication: Secondary | ICD-10-CM | POA: Diagnosis not present

## 2022-10-19 DIAGNOSIS — M79671 Pain in right foot: Secondary | ICD-10-CM | POA: Diagnosis not present

## 2022-10-19 DIAGNOSIS — M792 Neuralgia and neuritis, unspecified: Secondary | ICD-10-CM | POA: Diagnosis not present

## 2022-10-19 DIAGNOSIS — L603 Nail dystrophy: Secondary | ICD-10-CM | POA: Diagnosis not present

## 2022-10-19 DIAGNOSIS — E1151 Type 2 diabetes mellitus with diabetic peripheral angiopathy without gangrene: Secondary | ICD-10-CM | POA: Diagnosis not present

## 2022-10-19 DIAGNOSIS — I739 Peripheral vascular disease, unspecified: Secondary | ICD-10-CM | POA: Diagnosis not present

## 2022-10-19 DIAGNOSIS — M79672 Pain in left foot: Secondary | ICD-10-CM | POA: Diagnosis not present

## 2022-10-19 DIAGNOSIS — E1142 Type 2 diabetes mellitus with diabetic polyneuropathy: Secondary | ICD-10-CM | POA: Diagnosis not present

## 2022-10-20 ENCOUNTER — Encounter: Payer: Self-pay | Admitting: Family Medicine

## 2022-10-20 ENCOUNTER — Ambulatory Visit (INDEPENDENT_AMBULATORY_CARE_PROVIDER_SITE_OTHER): Payer: Medicare Other | Admitting: Family Medicine

## 2022-10-20 VITALS — BP 126/72 | HR 78 | Temp 97.9°F | Ht 62.0 in | Wt 180.0 lb

## 2022-10-20 DIAGNOSIS — N2581 Secondary hyperparathyroidism of renal origin: Secondary | ICD-10-CM | POA: Diagnosis not present

## 2022-10-20 DIAGNOSIS — J019 Acute sinusitis, unspecified: Secondary | ICD-10-CM | POA: Diagnosis not present

## 2022-10-20 DIAGNOSIS — Z992 Dependence on renal dialysis: Secondary | ICD-10-CM | POA: Diagnosis not present

## 2022-10-20 DIAGNOSIS — N186 End stage renal disease: Secondary | ICD-10-CM | POA: Diagnosis not present

## 2022-10-20 DIAGNOSIS — I159 Secondary hypertension, unspecified: Secondary | ICD-10-CM | POA: Diagnosis not present

## 2022-10-20 DIAGNOSIS — D631 Anemia in chronic kidney disease: Secondary | ICD-10-CM | POA: Diagnosis not present

## 2022-10-20 DIAGNOSIS — M792 Neuralgia and neuritis, unspecified: Secondary | ICD-10-CM | POA: Diagnosis not present

## 2022-10-20 DIAGNOSIS — E1129 Type 2 diabetes mellitus with other diabetic kidney complication: Secondary | ICD-10-CM | POA: Diagnosis not present

## 2022-10-20 MED ORDER — FLUTICASONE PROPIONATE 50 MCG/ACT NA SUSP: 2.0000 | Freq: Every day | NASAL | 6 refills | Status: DC

## 2022-10-20 MED ORDER — SUCRALFATE 1 GM/10ML PO SUSP: 1.0000 g | Freq: Three times a day (TID) | ORAL | 2 refills | Status: DC

## 2022-10-20 MED ORDER — OXYCODONE-ACETAMINOPHEN 5-325 MG PO TABS: 1.0000 | ORAL_TABLET | ORAL | 0 refills | Status: AC | PRN

## 2022-10-20 NOTE — Progress Notes (Signed)
Subjective:    Patient ID: Megan [name], female    DOB: 1960/12/15, 62 y.o.   MRN: [id number]  Patient has severe peripheral neuropathy.  She is on hemodialysis 3 times a week.  Based on her renal function she is already maximized the dose of gabapentin 300 mg at night.  She states that this helps some with the pain however the pain is severe.  It comes from time to time.  She states she may do well 3 days a week and then 1 day of severe pain.  She does not feel that the Cymbalta is helping at all.  Her husband is rubbing some type of salve such as Aspercreme on her legs and this seems to help a little bit.  She also reports nasal congestion, decreased hearing in her left ear consistent with eustachian tube dysfunction.  She denies any fevers or chills or sinus pain Past Medical History:  Diagnosis Date   (HFpEF) heart failure with preserved ejection fraction ([place])    a. 01/2019 Echo: EF 55-60%, mild conc LVH. DD.  Torn MV chordae.   Anemia    Atypical chest pain    a. 08/2018 MV: EF 59%, no ischemia; b. 02/2019 Cath: nonobs dzs.   Blood transfusion without reported diagnosis    Breast cancer ([place]) 10/12/2020   Cataract    ESRD (end stage renal disease) on dialysis [facility])    a. HD T, T, S   Essential hypertension, benign    GERD (gastroesophageal reflux disease)    Headache    Hemorrhoids    Mixed hyperlipidemia    Morbid obesity (HCC)    Non-obstructive CAD (coronary artery disease)    a. 02/2019 CathL LM nl, LAD 74m LCX nl, RCA 25p, EF 55-65%.   PONV (postoperative nausea and vomiting)    S/P colonoscopy Jan 2011   Dr. 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    Type 2 diabetes mellitus ([place]    Wears glasses    Past Surgical History:  Procedure Laterality Date   ABDOMINAL HYSTERECTOMY     APPENDECTOMY     ARTERY BIOPSY N/A 05/09/2018   Procedure: RIGHT TEMPORAL ARTERY BIOPSY;  Surgeon: WJudeth Horn MD;   [facility]  Service: General;  Laterality: N/A;   BREAST BIOPSY Right 06/15/2020   Procedure: RIGHT BREAST BIOPSY;  Surgeon: JAviva Signs MD;  [facility];  Service: General;  Laterality: Right;   CATARACT EXTRACTION W/PHACO Left 02/09/2017   Procedure: CATARACT EXTRACTION PHACO AND INTRAOCULAR LENS PLACEMENT LEFT EYE;  Surgeon: HTonny Branch MD;  Location: [facility];  Service: Ophthalmology;  Laterality: Left;  CDE: 4.89   CATARACT EXTRACTION W/PHACO Right 06/04/2017   Procedure: CATARACT EXTRACTION PHACO AND INTRAOCULAR LENS PLACEMENT (IOC);  Surgeon: HTonny Branch MD;  Location: [facility];  Service: Ophthalmology;  Laterality: Right;  CDE: 4.12   CHOLECYSTECTOMY  09/29/2011   Procedure: LAPAROSCOPIC CHOLECYSTECTOMY;  Surgeon: MJamesetta So MD;  Location: [facility];  Service: General;  Laterality: N/A;   COLONOSCOPY  08/2009   Dr. HBenson [place] sessile polyp (benign lymphoid), large hemorrhoids, repeat 5-10 years   COLONOSCOPY N/A 06/12/2016   prominent hemorrhoids   COLONOSCOPY WITH PROPOFOL N/A 06/16/2021   Procedure: COLONOSCOPY WITH PROPOFOL;  Surgeon: RDaneil Dolin MD;  [facility];  Service: Endoscopy;  Laterality: N/A;  9:30am (dialysis pt)   ESOPHAGOGASTRODUODENOSCOPY  09/05/2011   [id number] hernia; remainder of exam normal.  No explanation for patient's abdominal pain with today's examination   ESOPHAGOGASTRODUODENOSCOPY N/A 12/17/2013   Dr. [name]: gastric erythema, erosion, mild chronic inflammation on path    ESOPHAGOGASTRODUODENOSCOPY (EGD) WITH PROPOFOL N/A 06/16/2021   Procedure: ESOPHAGOGASTRODUODENOSCOPY (EGD) WITH PROPOFOL;  Surgeon: Daneil Dolin, MD;  [facility];  Service: Endoscopy;  Laterality: N/A;   EXCISION OF BREAST BIOPSY Right 10/12/2020   Procedure: EXCISION OF RIGHT BREAST BIOPSY;  Surgeon: Aviva Signs, MD;  Location: [facility];  Service: General;  Laterality: Right;   LAPAROSCOPIC APPENDECTOMY  09/29/2011   Procedure: APPENDECTOMY  LAPAROSCOPIC;  Surgeon: Jamesetta So, MD;  Location: [facility];  Service: General;;  incidental appendectomy   LEFT HEART CATH AND CORONARY ANGIOGRAPHY N/A 02/28/2019   Procedure: LEFT HEART CATH AND CORONARY ANGIOGRAPHY;  Surgeon: Jettie Booze, MD;  Location: [place] [facility];  Service: Cardiovascular;  Laterality: N/A;   MASTECTOMY MODIFIED RADICAL Right 02/18/2020   Procedure: MASTECTOMY MODIFIED RADICAL;  Surgeon: Aviva Signs, MD;  Location: [facility];  Service: General;  Laterality: Right;   MASTECTOMY, PARTIAL Right 07/13/2020   Procedure: RIGHT PARTIAL MASTECTOMY;  Surgeon: Aviva Signs, MD;  Location: [facility];  Service: General;  Laterality: Right;   PARTIAL MASTECTOMY WITH NEEDLE LOCALIZATION AND AXILLARY SENTINEL LYMPH NODE BX Right 09/18/2018   Procedure: RIGHT PARTIAL MASTECTOMY AFTER NEEDLE LOCALIZATION, SENTINEL LYMPH NODE BIOPSY RIGHT AXILLA;  Surgeon: Aviva Signs, MD;  Location: [facility];  Service: General;  Laterality: Right;   POLYPECTOMY  06/16/2021   Procedure: POLYPECTOMY;  Surgeon: Daneil Dolin, MD;  [facility];  Service: Endoscopy;;   SIMPLE MASTECTOMY WITH AXILLARY SENTINEL NODE BIOPSY Left 06/15/2020   Procedure: LEFT SIMPLE MASTECTOMY;  Surgeon: Aviva Signs, MD;  Location: [facility];  Service: General;  Laterality: Left;   Current Outpatient Medications on File Prior to Visit  Medication Sig Dispense Refill   amLODipine (NORVASC) 10 MG tablet TAKE 1 TABLET BY MOUTH AT BEDTIME 90 tablet 3   anastrozole (ARIMIDEX) 1 MG tablet TAKE 1 TABLET(1 MG) BY MOUTH DAILY 30 tablet 6   aspirin EC 81 MG tablet Take 81 mg by mouth daily.     atorvastatin (LIPITOR) 20 MG tablet Take 1 tablet (20 mg total) by mouth daily. TAKE 1 TABLET DAILY 90 tablet 3   B Complex-C-Zn-Folic Acid (DIALYVITE AB-123456789 15) 0.8 MG TABS Take 1 tablet by mouth daily.     cinacalcet (SENSIPAR) 30 MG tablet Take 2 tablets (60 mg total) by mouth daily. (Patient taking differently: Take 30  mg by mouth daily.) 90 tablet 0   Darbepoetin Alfa (ARANESP, ALBUMIN FREE, IJ) Darbepoetin Alfa (Aranesp)     DULoxetine (CYMBALTA) 30 MG capsule TAKE 1 CAPSULE(30 MG) BY MOUTH DAILY (Patient taking differently: Take 30 mg by mouth daily.) 90 capsule 1   gabapentin (NEURONTIN) 300 MG capsule TAKE 1 CAPSULE(300 MG) BY MOUTH AT BEDTIME 90 capsule 1   isosorbide mononitrate (IMDUR) 30 MG 24 hr tablet Take 1.5 tablets (45 mg total) by mouth daily. 135 tablet 3   lanthanum (FOSRENOL) 1000 MG chewable tablet Chew 2,000-3,000 mg by mouth See admin instructions. Take 3 tablets (3000 mg) by mouth with meals and take 2 tablets (2000 mg) with snacks     lidocaine-prilocaine (EMLA) cream Apply 1 application  topically every Monday, Wednesday, and Friday with hemodialysis.     meclizine (ANTIVERT) 25 MG tablet TAKE 1 TABLET(25 MG) BY MOUTH THREE TIMES DAILY AS NEEDED FOR DIZZINESS 30 tablet  1   nebivolol (BYSTOLIC) 10 MG tablet Take 1 tablet (10 mg total) by mouth in the morning and at bedtime. 180 tablet 3   nitroGLYCERIN (NITROSTAT) 0.4 MG SL tablet Place 1 tablet (0.4 mg total) under the tongue every 5 (five) minutes x 3 doses as needed for chest pain (if no relief after 3rd dose, proceed to ED or call 911). 25 tablet 3   pantoprazole (PROTONIX) 40 MG tablet TAKE 1 TABLET(40 MG) BY MOUTH DAILY 90 tablet 0   No current facility-administered medications on file prior to visit.   No Known Allergies Social History   Socioeconomic History   Marital status: Married    Spouse name: Not on file   Number of children: Not on file   Years of education: Not on file   Highest education level: Not on file  Occupational History   Occupation: Merchandiser, retail: [id number]  Tobacco Use   Smoking status: Never    Passive exposure: Never   Smokeless tobacco: Never  Vaping Use   Vaping Use: Never used  Substance and Sexual Activity   Alcohol use: No   Drug use: No   Sexual activity: Yes    Birth  control/protection: Surgical  Other Topics Concern   Not on file  Social History Narrative   Works at [employer] in [place].    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   Also has to get items from high shelves-causes achy pain in shoulder area      Married.   Children are grown, out of house.   Social Determinants of Health   Financial Resource Strain: Low Risk  (10/10/2022)   Overall Financial Resource Strain (CARDIA)    Difficulty of Paying Living Expenses: Not hard at all  Food Insecurity: No Food Insecurity (10/10/2022)   Hunger Vital Sign    Worried About Running Out of Food in the Last Year: Never true    Ran Out of Food in the Last Year: Never true  Transportation Needs: No Transportation Needs (09/14/2022)   PRAPARE - Hydrologist (Medical): No    Lack of Transportation (Non-Medical): No  Physical Activity: Inactive (09/14/2022)   Exercise Vital Sign    Days of Exercise per Week: 0 days    Minutes of Exercise per Session: 0 min  Stress: No Stress Concern Present (09/14/2022)   [place]    Feeling of Stress : Not at all  Social Connections: [place] (09/14/2022)   Social Connection and Isolation Panel [NHANES]    Frequency of Communication with Friends and Family: More than three times a week    Frequency of Social Gatherings with Friends and Family: Three times a week    Attends Religious Services: More than 4 times per year    Active Member of Clubs or Organizations: Yes    Attends Archivist Meetings: More than 4 times per year    Marital Status: Married  Human resources officer Violence: Not At Risk (09/14/2022)   Humiliation, Afraid, Rape, and Kick questionnaire    Fear of Current or Ex-Partner: No    Emotionally Abused: No    Physically Abused: No    Sexually Abused: No    Past Medical History:  Diagnosis Date   (HFpEF) heart failure with  preserved ejection fraction ([place])    a. 01/2019 Echo: EF 55-60%, mild conc  LVH. DD.  Torn MV chordae.   Anemia    Atypical chest pain    a. 08/2018 MV: EF 59%, no ischemia; b. 02/2019 Cath: nonobs dzs.   Blood transfusion without reported diagnosis    Breast cancer ([place]) 10/12/2020   Cataract    ESRD (end stage renal disease) on dialysis [facility])    a. HD T, T, S   Essential hypertension, benign    GERD (gastroesophageal reflux disease)    Headache    Hemorrhoids    Mixed hyperlipidemia    Morbid obesity (HCC)    Non-obstructive CAD (coronary artery disease)    a. 02/2019 CathL LM nl, LAD 72m LCX nl, RCA 25p, EF 55-65%.   PONV (postoperative nausea and vomiting)    S/P colonoscopy Jan 2011   Dr. 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 ([place]    Type 2 diabetes mellitus ([place]    Wears glasses    Past Surgical History:  Procedure Laterality Date   ABDOMINAL HYSTERECTOMY     APPENDECTOMY     ARTERY BIOPSY N/A 05/09/2018   Procedure: RIGHT TEMPORAL ARTERY BIOPSY;  Surgeon: WJudeth Horn MD;  [facility]  Service: General;  Laterality: N/A;   BREAST BIOPSY Right 06/15/2020   Procedure: RIGHT BREAST BIOPSY;  Surgeon: JAviva Signs MD;  Location: [facility];  Service: General;  Laterality: Right;   CATARACT EXTRACTION W/PHACO Left 02/09/2017   Procedure: CATARACT EXTRACTION PHACO AND INTRAOCULAR LENS PLACEMENT LEFT EYE;  Surgeon: HTonny Branch MD;  Location: [facility];  Service: Ophthalmology;  Laterality: Left;  CDE: 4.89   CATARACT EXTRACTION W/PHACO Right 06/04/2017   Procedure: CATARACT EXTRACTION PHACO AND INTRAOCULAR LENS PLACEMENT (IOC);  Surgeon: HTonny Branch MD;  Location: [facility];  Service: Ophthalmology;  Laterality: Right;  CDE: 4.12   CHOLECYSTECTOMY  09/29/2011   Procedure: LAPAROSCOPIC CHOLECYSTECTOMY;  Surgeon: MJamesetta So MD;  Location: [facility];  Service: General;  Laterality: N/A;   COLONOSCOPY  08/2009   Dr. HBenson Norway  sessile polyp (benign lymphoid), large hemorrhoids, repeat 5-10 years   COLONOSCOPY N/A 06/12/2016   prominent hemorrhoids   COLONOSCOPY WITH PROPOFOL N/A 06/16/2021   Procedure: COLONOSCOPY WITH PROPOFOL;  Surgeon: RDaneil Dolin MD;  [facility];  Service: Endoscopy;  Laterality: N/A;  9:30am (dialysis pt)   ESOPHAGOGASTRODUODENOSCOPY  09/05/2011   [id number] hernia; remainder of exam normal. No explanation for patient's abdominal pain with today's examination   ESOPHAGOGASTRODUODENOSCOPY N/A 12/17/2013   Dr. RGala Romney gastric erythema, erosion, mild chronic inflammation on path    ESOPHAGOGASTRODUODENOSCOPY (EGD) WITH PROPOFOL N/A 06/16/2021   Procedure: ESOPHAGOGASTRODUODENOSCOPY (EGD) WITH PROPOFOL;  Surgeon: RDaneil Dolin MD;  [facility];  Service: Endoscopy;  Laterality: N/A;   EXCISION OF BREAST BIOPSY Right 10/12/2020   Procedure: EXCISION OF RIGHT BREAST BIOPSY;  Surgeon: JAviva Signs MD;  Location: [facility];  Service: General;  Laterality: Right;   LAPAROSCOPIC APPENDECTOMY  09/29/2011   Procedure: APPENDECTOMY LAPAROSCOPIC;  Surgeon: MJamesetta So MD;  Location: [facility];  Service: General;;  incidental appendectomy   LEFT HEART CATH AND CORONARY ANGIOGRAPHY N/A 02/28/2019   Procedure: LEFT HEART CATH AND CORONARY ANGIOGRAPHY;  Surgeon: VJettie Booze MD;  [facility];  Service: Cardiovascular;  Laterality: N/A;   MASTECTOMY MODIFIED RADICAL Right 02/18/2020   Procedure: MASTECTOMY MODIFIED RADICAL;  Surgeon: JAviva Signs MD;  Location: [facility];  Service: General;  Laterality: Right;  MASTECTOMY, PARTIAL Right 07/13/2020   Procedure: RIGHT PARTIAL MASTECTOMY;  Surgeon: Aviva Signs, MD;  Location: [facility];  Service: General;  Laterality: Right;   PARTIAL MASTECTOMY WITH NEEDLE LOCALIZATION AND AXILLARY SENTINEL LYMPH NODE BX Right 09/18/2018   Procedure: RIGHT PARTIAL MASTECTOMY AFTER NEEDLE LOCALIZATION, SENTINEL LYMPH NODE  BIOPSY RIGHT AXILLA;  Surgeon: Aviva Signs, MD;  Location: [facility];  Service: General;  Laterality: Right;   POLYPECTOMY  06/16/2021   Procedure: POLYPECTOMY;  Surgeon: Daneil Dolin, MD;  [facility];  Service: Endoscopy;;   SIMPLE MASTECTOMY WITH AXILLARY SENTINEL NODE BIOPSY Left 06/15/2020   Procedure: LEFT SIMPLE MASTECTOMY;  Surgeon: Aviva Signs, MD;  Location: [facility];  Service: General;  Laterality: Left;   Current Outpatient Medications on File Prior to Visit  Medication Sig Dispense Refill   amLODipine (NORVASC) 10 MG tablet TAKE 1 TABLET BY MOUTH AT BEDTIME 90 tablet 3   anastrozole (ARIMIDEX) 1 MG tablet TAKE 1 TABLET(1 MG) BY MOUTH DAILY 30 tablet 6   aspirin EC 81 MG tablet Take 81 mg by mouth daily.     atorvastatin (LIPITOR) 20 MG tablet Take 1 tablet (20 mg total) by mouth daily. TAKE 1 TABLET DAILY 90 tablet 3   B Complex-C-Zn-Folic Acid (DIALYVITE AB-123456789 15) 0.8 MG TABS Take 1 tablet by mouth daily.     cinacalcet (SENSIPAR) 30 MG tablet Take 2 tablets (60 mg total) by mouth daily. (Patient taking differently: Take 30 mg by mouth daily.) 90 tablet 0   Darbepoetin Alfa (ARANESP, ALBUMIN FREE, IJ) Darbepoetin Alfa (Aranesp)     DULoxetine (CYMBALTA) 30 MG capsule TAKE 1 CAPSULE(30 MG) BY MOUTH DAILY (Patient taking differently: Take 30 mg by mouth daily.) 90 capsule 1   gabapentin (NEURONTIN) 300 MG capsule TAKE 1 CAPSULE(300 MG) BY MOUTH AT BEDTIME 90 capsule 1   isosorbide mononitrate (IMDUR) 30 MG 24 hr tablet Take 1.5 tablets (45 mg total) by mouth daily. 135 tablet 3   lanthanum (FOSRENOL) 1000 MG chewable tablet Chew 2,000-3,000 mg by mouth See admin instructions. Take 3 tablets (3000 mg) by mouth with meals and take 2 tablets (2000 mg) with snacks     lidocaine-prilocaine (EMLA) cream Apply 1 application  topically every Monday, Wednesday, and Friday with hemodialysis.     meclizine (ANTIVERT) 25 MG tablet TAKE 1 TABLET(25 MG) BY MOUTH THREE TIMES DAILY AS  NEEDED FOR DIZZINESS 30 tablet 1   nebivolol (BYSTOLIC) 10 MG tablet Take 1 tablet (10 mg total) by mouth in the morning and at bedtime. 180 tablet 3   nitroGLYCERIN (NITROSTAT) 0.4 MG SL tablet Place 1 tablet (0.4 mg total) under the tongue every 5 (five) minutes x 3 doses as needed for chest pain (if no relief after 3rd dose, proceed to ED or call 911). 25 tablet 3   pantoprazole (PROTONIX) 40 MG tablet TAKE 1 TABLET(40 MG) BY MOUTH DAILY 90 tablet 0   No current facility-administered medications on file prior to visit.   No Known Allergies Social History   Socioeconomic History   Marital status: Married    Spouse name: Not on file   Number of children: Not on file   Years of education: Not on file   Highest education level: Not on file  Occupational History   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[id number]  Tobacco Use   Smoking status: Never    Passive exposure: Never   Smokeless tobacco: Never  Vaping Use  Vaping Use: Never used  Substance and Sexual Activity   Alcohol use: No   Drug use: No   Sexual activity: Yes    Birth control/protection: Surgical  Other Topics Concern   Not on file  Social History Narrative   Works at [employer] in [place].    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   Also has to get items from high shelves-causes achy pain in shoulder area      Married.   Children are grown, out of house.   Social Determinants of Health   Financial Resource Strain: Low Risk  (10/10/2022)   Overall Financial Resource Strain (CARDIA)    Difficulty of Paying Living Expenses: Not hard at all  Food Insecurity: No Food Insecurity (10/10/2022)   Hunger Vital Sign    Worried About Running Out of Food in the Last Year: Never true    Ran Out of Food in the Last Year: Never true  Transportation Needs: No Transportation Needs (09/14/2022)   PRAPARE - Hydrologist (Medical): No    Lack of Transportation (Non-Medical): No   Physical Activity: Inactive (09/14/2022)   Exercise Vital Sign    Days of Exercise per Week: 0 days    Minutes of Exercise per Session: 0 min  Stress: No Stress Concern Present (09/14/2022)   [place]    Feeling of Stress : Not at all  Social Connections: [place] (09/14/2022)   Social Connection and Isolation Panel [NHANES]    Frequency of Communication with Friends and Family: More than three times a week    Frequency of Social Gatherings with Friends and Family: Three times a week    Attends Religious Services: More than 4 times per year    Active Member of Clubs or Organizations: Yes    Attends Archivist Meetings: More than 4 times per year    Marital Status: Married  Human resources officer Violence: Not At Risk (09/14/2022)   Humiliation, Afraid, Rape, and Kick questionnaire    Fear of Current or Ex-Partner: No    Emotionally Abused: No    Physically Abused: No    Sexually Abused: No      Review of Systems  All other systems reviewed and are negative.      Objective:   Physical Exam Vitals reviewed.  Constitutional:      General: She is not in acute distress.    Appearance: Normal appearance. She is normal weight.  Cardiovascular:     Rate and Rhythm: Normal rate and regular rhythm.     Pulses: Normal pulses.     Heart sounds: Murmur (thrill from AV fistual heard as murmur) heard.  Pulmonary:     Effort: Pulmonary effort is normal. No respiratory distress.     Breath sounds: Normal breath sounds. No stridor. No wheezing, rhonchi or rales.  Chest:     Chest wall: No tenderness.  Musculoskeletal:     Right lower leg: No edema.     Left lower leg: No edema.     Right foot: Normal range of motion. No deformity.  Feet:     Right foot:     Skin integrity: No ulcer, blister, skin breakdown, erythema, warmth or callus.     Left foot:     Skin integrity: No ulcer, blister, skin breakdown,  erythema, warmth or callus.  Neurological:     Mental Status: She is  alert.           Assessment & Plan:  Neuropathic pain  Acute non-recurrent sinusitis, unspecified location We discussed her options.  We will discontinue hydrocodone.  Instead I will give her Percocet 5/325 1 p.o. every 4 hours as needed for pain.  I will give her 30 tablets to last 1 month.  She states she may go 3 days without any pain and then 1 day have severe burning and stinging pain in the.  On those days she can use Percocet for the pain.  Continue to use gabapentin 300 mg p.o. nightly as well as Cymbalta 30 mg a day.  For her sinusitis, we will use Flonase 2 sprays each nostril daily

## 2022-10-23 DIAGNOSIS — N2581 Secondary hyperparathyroidism of renal origin: Secondary | ICD-10-CM | POA: Diagnosis not present

## 2022-10-23 DIAGNOSIS — D631 Anemia in chronic kidney disease: Secondary | ICD-10-CM | POA: Diagnosis not present

## 2022-10-23 DIAGNOSIS — E1129 Type 2 diabetes mellitus with other diabetic kidney complication: Secondary | ICD-10-CM | POA: Diagnosis not present

## 2022-10-23 DIAGNOSIS — I159 Secondary hypertension, unspecified: Secondary | ICD-10-CM | POA: Diagnosis not present

## 2022-10-23 DIAGNOSIS — N186 End stage renal disease: Secondary | ICD-10-CM | POA: Diagnosis not present

## 2022-10-23 DIAGNOSIS — Z992 Dependence on renal dialysis: Secondary | ICD-10-CM | POA: Diagnosis not present

## 2022-10-25 DIAGNOSIS — E1129 Type 2 diabetes mellitus with other diabetic kidney complication: Secondary | ICD-10-CM | POA: Diagnosis not present

## 2022-10-25 DIAGNOSIS — N186 End stage renal disease: Secondary | ICD-10-CM | POA: Diagnosis not present

## 2022-10-25 DIAGNOSIS — D631 Anemia in chronic kidney disease: Secondary | ICD-10-CM | POA: Diagnosis not present

## 2022-10-25 DIAGNOSIS — Z992 Dependence on renal dialysis: Secondary | ICD-10-CM | POA: Diagnosis not present

## 2022-10-25 DIAGNOSIS — N2581 Secondary hyperparathyroidism of renal origin: Secondary | ICD-10-CM | POA: Diagnosis not present

## 2022-10-25 DIAGNOSIS — I159 Secondary hypertension, unspecified: Secondary | ICD-10-CM | POA: Diagnosis not present

## 2022-10-26 DIAGNOSIS — N186 End stage renal disease: Secondary | ICD-10-CM | POA: Diagnosis not present

## 2022-10-26 DIAGNOSIS — R2232 Localized swelling, mass and lump, left upper limb: Secondary | ICD-10-CM | POA: Diagnosis not present

## 2022-10-26 DIAGNOSIS — Z992 Dependence on renal dialysis: Secondary | ICD-10-CM | POA: Diagnosis not present

## 2022-10-27 DIAGNOSIS — N186 End stage renal disease: Secondary | ICD-10-CM | POA: Diagnosis not present

## 2022-10-27 DIAGNOSIS — I159 Secondary hypertension, unspecified: Secondary | ICD-10-CM | POA: Diagnosis not present

## 2022-10-27 DIAGNOSIS — E1129 Type 2 diabetes mellitus with other diabetic kidney complication: Secondary | ICD-10-CM | POA: Diagnosis not present

## 2022-10-27 DIAGNOSIS — N2581 Secondary hyperparathyroidism of renal origin: Secondary | ICD-10-CM | POA: Diagnosis not present

## 2022-10-27 DIAGNOSIS — D631 Anemia in chronic kidney disease: Secondary | ICD-10-CM | POA: Diagnosis not present

## 2022-10-27 DIAGNOSIS — Z992 Dependence on renal dialysis: Secondary | ICD-10-CM | POA: Diagnosis not present

## 2022-10-30 DIAGNOSIS — E1129 Type 2 diabetes mellitus with other diabetic kidney complication: Secondary | ICD-10-CM | POA: Diagnosis not present

## 2022-10-30 DIAGNOSIS — N2581 Secondary hyperparathyroidism of renal origin: Secondary | ICD-10-CM | POA: Diagnosis not present

## 2022-10-30 DIAGNOSIS — D631 Anemia in chronic kidney disease: Secondary | ICD-10-CM | POA: Diagnosis not present

## 2022-10-30 DIAGNOSIS — I159 Secondary hypertension, unspecified: Secondary | ICD-10-CM | POA: Diagnosis not present

## 2022-10-30 DIAGNOSIS — Z992 Dependence on renal dialysis: Secondary | ICD-10-CM | POA: Diagnosis not present

## 2022-10-30 DIAGNOSIS — N186 End stage renal disease: Secondary | ICD-10-CM | POA: Diagnosis not present

## 2022-10-31 ENCOUNTER — Other Ambulatory Visit: Payer: Self-pay | Admitting: Family Medicine

## 2022-10-31 ENCOUNTER — Telehealth: Payer: Self-pay | Admitting: Family Medicine

## 2022-10-31 MED ORDER — GABAPENTIN 300 MG PO CAPS: ORAL_CAPSULE | ORAL | 1 refills | Status: DC

## 2022-10-31 NOTE — Telephone Encounter (Signed)
Prescription Request  10/31/2022  LOV: 10/20/2022  What is the name of the medication or equipment?   gabapentin (NEURONTIN) 300 MG capsule  **PATIENT AGREED TO DOSE ADJUSTMENT DISCUSSED AT PREVIOUS VISIT; REQUESTED FOR SCRIPT TO BE ADJUSTED ACCORDINGLY.**  Have you contacted your pharmacy to request a refill? Yes   Which pharmacy would you like this sent to?   [id number] - [place], [address], [place] [address] Phone: [phone]  Fax: [phone] DEA #: [id number]    Patient notified that their request is being sent to the clinical staff for review and that they should receive a response within 2 business days.   Please advise patient when refill sent in at [phone].

## 2022-11-01 DIAGNOSIS — D631 Anemia in chronic kidney disease: Secondary | ICD-10-CM | POA: Diagnosis not present

## 2022-11-01 DIAGNOSIS — Z992 Dependence on renal dialysis: Secondary | ICD-10-CM | POA: Diagnosis not present

## 2022-11-01 DIAGNOSIS — N2581 Secondary hyperparathyroidism of renal origin: Secondary | ICD-10-CM | POA: Diagnosis not present

## 2022-11-01 DIAGNOSIS — I159 Secondary hypertension, unspecified: Secondary | ICD-10-CM | POA: Diagnosis not present

## 2022-11-01 DIAGNOSIS — E1129 Type 2 diabetes mellitus with other diabetic kidney complication: Secondary | ICD-10-CM | POA: Diagnosis not present

## 2022-11-01 DIAGNOSIS — N186 End stage renal disease: Secondary | ICD-10-CM | POA: Diagnosis not present

## 2022-11-03 DIAGNOSIS — E1129 Type 2 diabetes mellitus with other diabetic kidney complication: Secondary | ICD-10-CM | POA: Diagnosis not present

## 2022-11-03 DIAGNOSIS — I159 Secondary hypertension, unspecified: Secondary | ICD-10-CM | POA: Diagnosis not present

## 2022-11-03 DIAGNOSIS — N2581 Secondary hyperparathyroidism of renal origin: Secondary | ICD-10-CM | POA: Diagnosis not present

## 2022-11-03 DIAGNOSIS — D631 Anemia in chronic kidney disease: Secondary | ICD-10-CM | POA: Diagnosis not present

## 2022-11-03 DIAGNOSIS — Z992 Dependence on renal dialysis: Secondary | ICD-10-CM | POA: Diagnosis not present

## 2022-11-03 DIAGNOSIS — N186 End stage renal disease: Secondary | ICD-10-CM | POA: Diagnosis not present

## 2022-11-06 ENCOUNTER — Telehealth: Payer: Self-pay | Admitting: *Deleted

## 2022-11-06 DIAGNOSIS — N2581 Secondary hyperparathyroidism of renal origin: Secondary | ICD-10-CM | POA: Diagnosis not present

## 2022-11-06 DIAGNOSIS — N186 End stage renal disease: Secondary | ICD-10-CM | POA: Diagnosis not present

## 2022-11-06 DIAGNOSIS — I159 Secondary hypertension, unspecified: Secondary | ICD-10-CM | POA: Diagnosis not present

## 2022-11-06 DIAGNOSIS — Z992 Dependence on renal dialysis: Secondary | ICD-10-CM | POA: Diagnosis not present

## 2022-11-06 DIAGNOSIS — D631 Anemia in chronic kidney disease: Secondary | ICD-10-CM | POA: Diagnosis not present

## 2022-11-06 DIAGNOSIS — E1129 Type 2 diabetes mellitus with other diabetic kidney complication: Secondary | ICD-10-CM | POA: Diagnosis not present

## 2022-11-06 NOTE — Telephone Encounter (Signed)
Patient called to advise pain and swelling in R axillary area, which is a new finding.  She has tried warm compresses without effectiveness.  She is has had a bilateral mastectomy.  Added to schedule for Dr. Delton Coombes tomorrow for assessment.

## 2022-11-07 ENCOUNTER — Inpatient Hospital Stay: Payer: Medicare Other | Attending: Hematology | Admitting: Hematology

## 2022-11-07 VITALS — BP 120/71 | HR 81 | Temp 97.1°F | Resp 18 | Ht 61.5 in | Wt 183.3 lb

## 2022-11-07 DIAGNOSIS — E1122 Type 2 diabetes mellitus with diabetic chronic kidney disease: Secondary | ICD-10-CM | POA: Insufficient documentation

## 2022-11-07 DIAGNOSIS — N186 End stage renal disease: Secondary | ICD-10-CM | POA: Insufficient documentation

## 2022-11-07 DIAGNOSIS — M8580 Other specified disorders of bone density and structure, unspecified site: Secondary | ICD-10-CM | POA: Insufficient documentation

## 2022-11-07 DIAGNOSIS — Z170 Estrogen receptor positive status [ER+]: Secondary | ICD-10-CM | POA: Diagnosis not present

## 2022-11-07 DIAGNOSIS — M79621 Pain in right upper arm: Secondary | ICD-10-CM

## 2022-11-07 DIAGNOSIS — C50211 Malignant neoplasm of upper-inner quadrant of right female breast: Secondary | ICD-10-CM | POA: Insufficient documentation

## 2022-11-07 DIAGNOSIS — Z90710 Acquired absence of both cervix and uterus: Secondary | ICD-10-CM | POA: Diagnosis not present

## 2022-11-07 DIAGNOSIS — E559 Vitamin D deficiency, unspecified: Secondary | ICD-10-CM | POA: Diagnosis not present

## 2022-11-07 DIAGNOSIS — G629 Polyneuropathy, unspecified: Secondary | ICD-10-CM | POA: Insufficient documentation

## 2022-11-07 DIAGNOSIS — I129 Hypertensive chronic kidney disease with stage 1 through stage 4 chronic kidney disease, or unspecified chronic kidney disease: Secondary | ICD-10-CM | POA: Insufficient documentation

## 2022-11-07 NOTE — Patient Instructions (Addendum)
[place] at [facility] Discharge Instructions   You were seen and examined today by Dr. Delton Coombes.  We will arrange for you to have an ultrasound of your right underarm. We will see you back after this to review the results.   Return as scheduled.   Thank you for choosing [place] at [facility] to provide your oncology and hematology care.  To afford each patient quality time with our provider, please arrive at least 15 minutes before your scheduled appointment time.   If you have a lab appointment with the [facility] please come in thru the Main Entrance and check in at the main information desk.  You need to re-schedule your appointment should you arrive 10 or more minutes late.  We strive to give you quality time with our providers, and arriving late affects you and other patients whose appointments are after yours.  Also, if you no show three or more times for appointments you may be dismissed from the clinic at the providers discretion.     Again, thank you for choosing [facility]  Our hope is that these requests will decrease the amount of time that you wait before being seen by our physicians.       _____________________________________________________________  Should you have questions after your visit to [facility], please contact our office at [phone] and follow the prompts.  Our office hours are 8:00 a.m. and 4:30 p.m. Monday - Friday.  Please note that voicemails left after 4:00 p.m. may not be returned until the following business day.  We are closed weekends and major holidays.  You do have access to a nurse 24-7, just call the main number to the clinic [phone] and do not press any options, hold on the line and a nurse will answer the phone.    For prescription refill requests, have your pharmacy contact our office and allow 72 hours.    Due to Covid, you will need to wear a mask upon  entering the hospital. If you do not have a mask, a mask will be given to you at the Main Entrance upon arrival. For doctor visits, patients may have 1 support person age 54 or older with them. For treatment visits, patients can not have anyone with them due to social distancing guidelines and our immunocompromised population.

## 2022-11-07 NOTE — Progress Notes (Signed)
[facility] [address], [place] [address]    Clinic Day:  11/07/2022  Referring physician: [name], MD  Patient Care Team: [name], MD as PCP - General (Family Medicine) [name] [name], MD as PCP - Cardiology (Cardiology) [name] [name], MD (Gastroenterology) [name], MD as Consulting Physician (Cardiology) Derek Jack, MD as Medical Oncologist (Medical Oncology) Donato Heinz, MD as Consulting Physician (Nephrology)   ASSESSMENT & PLAN:   Assessment: 1.  Stage Ib (T1CN0) right breast cancer, ER positive, PR and HER-2 negative: -Right lumpectomy and sentinel lymph node biopsy on 09/18/2018 shows 1.5 cm IDC, grade 3, associated with high-grade DCIS, negative margins, 0/3 lymph nodes positive, ER 50%, PR negative and HER-2 negative, Ki-67 60%. -Oncotype DX recurrence score 47.  Distant recurrence at 9 years with tamoxifen alone is 36%.  Absolute chemotherapy benefit is more than 15%. -Adjuvant chemotherapy with 4 cycles of AC from 10/30/2018 through 01/01/2019. -She was started on anastrozole in June 2020. -Mammogram on 09/02/2019 showed calcifications in the posterior aspect of upper outer quadrant of the right breast. -Right partial mastectomy on 02/18/2020 shows high-grade DCIS, 1.5 cm, no invasive carcinoma, resection margins negative.  0/11 lymph nodes.  ER 30% positive, PR negative. -Left simple mastectomy on 06/15/2020 was benign.  Right breast biopsy shows microscopic focus of invasive ductal carcinoma in the background of extensive high-grade DCIS. -Right breast lumpectomy on 07/13/2020 shows multifocal invasive ductal carcinoma, grade 3, largest measuring 1.2 cm.  Extensive high-grade DCIS with necrosis.  Margins are free.  PT1CNX.  There are multiple foci of invasive carcinoma arising from extensive DCIS.  Several small foci of invasive tumor arising from DCIS indicating new primaries.  ER/PR/HER-2 is negative.   Ki-67 is 20%. -PET scan on 09/14/2020 shows areas of nodularity with discrete nodule in the fat of the inferior chest wall within the subcutaneous tissues. -Ultrasound of the right chest wall shows masslike abnormality in the upper inner aspect of the right anterior chest at 1 o'clock position measuring 4.1 cm in greatest dimension.  1.7 cm hypoechoic mass in the central anterior right chest concerning for malignancy.  Possible borderline enlarged residual right axillary lymph node. -Right breast biopsy on 10/12/2020 shows 1.3 cm invasive ductal carcinoma, focally 0.1 cm from superior margin.  DCIS is less than 0.1 cm from superior margin.  PT1CPNX. - Right mastectomy followed by radiation therapy completed in May 2022.   2.  Osteopenia: -Bone density on 01/20/2019 shows T score -1.9. - DEXA scan (03/24/2021) T score -2.4  Plan: 1.  Recurrent right breast cancer, triple receptor negative: - She reports tolerating anastrozole very well. - She made an unscheduled visit today as she complained of pain in the right anterior axillary line.  She has this pain for a long time but it has gotten worse lately. - Physical exam today shows some thickness in the reported pain area in the right anterior axillary line.  No palpable adenopathy or masses. - Recent labs from 09/12/2022 showed normal LFTs near normal CBC. - Recent ultrasound of the left upper extremity showed no adenopathy or masses. - I have recommended right axillary ultrasound and follow-up.  Will also discussed with Dr. Arnoldo Morale after the imaging.     2.  Osteopenia/vitamin D deficiency: - Continue Nephro-Vite and vitamin D.   3.  ESRD on HD: - Continue HD on Monday, Wednesday and Friday under the direction of Dr. Marval Regal.   4.  Neuropathy: -  Continue gabapentin at bedtime.   Breast Cancer therapy associated bone loss: I have recommended calcium, Vitamin D and weight bearing exercises.  Orders Placed This Encounter  Procedures   [place]  LIMITED ULTRASOUND INCLUDING AXILLA RIGHT BREAST    Standing Status:   Future    Standing Expiration Date:   11/07/2023    Order Specific Question:   Reason for Exam (SYMPTOM  OR DIAGNOSIS REQUIRED)    Answer:   right axillary pain w/nodule; hx breast cancer; bilat mastectomy    Order Specific Question:   Preferred imaging location?    Answer:   [facility] as a scribe for Derek Jack, MD.,have documented all relevant documentation on the behalf of Derek Jack, MD,as directed by  Derek Jack, MD while in the presence of Derek Jack, MD.   I, Derek Jack MD, have reviewed the above documentation for accuracy and completeness, and I agree with the above.   Derek Jack, MD   3/26/20246:11 PM  CHIEF COMPLAINT:   Diagnosis: right breast cancer    Cancer Staging  Malignant neoplasm of upper-inner quadrant of right female breast [facility]) Staging form: Breast, AJCC 8th Edition - Clinical stage from 09/26/2018: Stage IB (cT1c, cN0, cM0, G3, ER+, PR-, HER2-) - Signed by Derek Jack, MD on 09/26/2018    Prior Therapy:  Right lumpectomy and sentinel lymph node biopsy on 09/18/2018  Adjuvant chemotherapy with 4 cycles of AC from 10/30/2018 through 01/01/2019  Right partial mastectomy on 02/18/2020  Right breast lumpectomy on 07/13/2020  Right mastectomy followed by radiation therapy completed in May 2022   Current Therapy:  anastrozole started June 2020  HISTORY OF PRESENT ILLNESS:   Oncology History  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[place])  09/18/2018 Initial Diagnosis   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[place])   09/26/2018 Cancer Staging   Staging form: Breast, AJCC 8th Edition - Clinical stage from 09/26/2018: Stage IB (cT1c, cN0, cM0, G3, ER+, PR-, HER2-) - Signed by Derek Jack, MD on 09/26/2018   10/30/2018 - 01/01/2019 Chemotherapy    Patient is on Treatment Plan : BREAST Adjuvant AC q21d        INTERVAL HISTORY:   Megan [name] is a 62 y.o. female presenting to clinic today for follow up of right breast cancer. She was last seen by me on 07/13/22.  She was seen in the ED on 08/15/22 for chest discomfort, reflux, and congestion. She had a negative chest pain work-up to include x2 HS troponins. She was treated with a GI cocktail. She had a negative respiratory panel. Her symptoms seemed to be most consistent with a URI. She was started on Zithromax and Augmentin at that time.  Patient was treated again in the ED on 09/12/22 for intermittent chest pain and shortness of breath. She had a BNP of 130 but otherwise her chest pain work-up including HS troponins were negative. She was again treated with a GI cocktail. She was started on Carafate at time of discharge home.  Today, she states that she is having severe, sharp right axillary pain which has worsened in the last few months. Her pain is constant. She denies any modifying factors or radiation of the pain. She tried a heating pad and hot shower yesterday without relief. She also took a Percocet which helped her sleep for a while but when she woke up her pain was unchanged. She is taking Gabapentin at night without relief  as well. She reports that she continues to have left upper arm swelling. She had left axilla [place] on 10/05/22 which was unremarkable. Her appetite level is at 50%. Her energy level is at 50%.  She reports compliance and good tolerance of Anastrozole.  PAST MEDICAL HISTORY:   Past Medical History: Past Medical History:  Diagnosis Date   (HFpEF) heart failure with preserved ejection fraction ([place])    a. 01/2019 Echo: EF 55-60%, mild conc LVH. DD.  Torn MV chordae.   Anemia    Atypical chest pain    a. 08/2018 MV: EF 59%, no ischemia; b. 02/2019 Cath: nonobs dzs.   Blood transfusion without reported diagnosis    Breast cancer ([place]) 10/12/2020   Cataract    ESRD  (end stage renal disease) on dialysis [facility])    a. HD T, T, S   Essential hypertension, benign    GERD (gastroesophageal reflux disease)    Headache    Hemorrhoids    Mixed hyperlipidemia    Morbid obesity (H[facility])    Non-obstructive CAD (coronary artery disease)    a. 02/2019 CathL LM nl, LAD 23m, LCX nl, RCA 25p, EF 55-65%.   PONV (postoperative nausea and vomiting)    S/P colonoscopy Jan 2011   Dr. Benson Norway: sessile polyp (benign lymphoid), large hemorrhoids, repeat 5-10 years   Temporal arteritis ([place])    Type 2 diabetes mellitus ([place])    Wears glasses     Surgical History: Past Surgical History:  Procedure Laterality Date   ABDOMINAL HYSTERECTOMY     APPENDECTOMY     ARTERY BIOPSY N/A 05/09/2018   Procedure: RIGHT TEMPORAL ARTERY BIOPSY;  Surgeon: Judeth Horn, MD;  Location: [place];  Service: General;  Laterality: N/A;   BREAST BIOPSY Right 06/15/2020   Procedure: RIGHT BREAST BIOPSY;  Surgeon: Aviva Signs, MD;  Location: [facility];  Service: General;  Laterality: Right;   CATARACT EXTRACTION W/PHACO Left 02/09/2017   Procedure: CATARACT EXTRACTION PHACO AND INTRAOCULAR LENS PLACEMENT LEFT EYE;  Surgeon: Tonny Branch, MD;  Location: [facility];  Service: Ophthalmology;  Laterality: Left;  CDE: 4.89   CATARACT EXTRACTION W/PHACO Right 06/04/2017   Procedure: CATARACT EXTRACTION PHACO AND INTRAOCULAR LENS PLACEMENT (IOC);  Surgeon: Tonny Branch, MD;  Location: [facility];  Service: Ophthalmology;  Laterality: Right;  CDE: 4.12   CHOLECYSTECTOMY  09/29/2011   Procedure: LAPAROSCOPIC CHOLECYSTECTOMY;  Surgeon: Jamesetta So, MD;  Location: [facility];  Service: General;  Laterality: N/A;   COLONOSCOPY  08/2009   Dr. Benson Norway: sessile polyp (benign lymphoid), large hemorrhoids, repeat 5-10 years   COLONOSCOPY N/A 06/12/2016   prominent hemorrhoids   COLONOSCOPY WITH PROPOFOL N/A 06/16/2021   Procedure: COLONOSCOPY WITH PROPOFOL;  Surgeon: Daneil Dolin, MD;  [facility];  Service: Endoscopy;   Laterality: N/A;  9:30am (dialysis pt)   ESOPHAGOGASTRODUODENOSCOPY  09/05/2011   [id number] hiatal hernia; remainder of exam normal. No explanation for patient's abdominal pain with today's examination   ESOPHAGOGASTRODUODENOSCOPY N/A 12/17/2013   Dr. [name]: gastric erythema, erosion, mild chronic inflammation on path    ESOPHAGOGASTRODUODENOSCOPY (EGD) WITH PROPOFOL N/A 06/16/2021   Procedure: ESOPHAGOGASTRODUODENOSCOPY (EGD) WITH PROPOFOL;  Surgeon: Daneil Dolin, MD;  [facility];  Service: Endoscopy;  Laterality: N/A;   EXCISION OF BREAST BIOPSY Right 10/12/2020   Procedure: EXCISION OF RIGHT BREAST BIOPSY;  Surgeon: Aviva Signs, MD;  Location: [facility];  Service: General;  Laterality: Right;   LAPAROSCOPIC APPENDECTOMY  09/29/2011   Procedure: APPENDECTOMY  LAPAROSCOPIC;  Surgeon: Jamesetta So, MD;  Location: [facility];  Service: General;;  incidental appendectomy   LEFT HEART CATH AND CORONARY ANGIOGRAPHY N/A 02/28/2019   Procedure: LEFT HEART CATH AND CORONARY ANGIOGRAPHY;  Surgeon: Jettie Booze, MD;  Location: [facility] [facility];  Service: Cardiovascular;  Laterality: N/A;   MASTECTOMY MODIFIED RADICAL Right 02/18/2020   Procedure: MASTECTOMY MODIFIED RADICAL;  Surgeon: Aviva Signs, MD;  Location: [facility];  Service: General;  Laterality: Right;   MASTECTOMY, PARTIAL Right 07/13/2020   Procedure: RIGHT PARTIAL MASTECTOMY;  Surgeon: Aviva Signs, MD;  Location: [facility];  Service: General;  Laterality: Right;   PARTIAL MASTECTOMY WITH NEEDLE LOCALIZATION AND AXILLARY SENTINEL LYMPH NODE BX Right 09/18/2018   Procedure: RIGHT PARTIAL MASTECTOMY AFTER NEEDLE LOCALIZATION, SENTINEL LYMPH NODE BIOPSY RIGHT AXILLA;  Surgeon: Aviva Signs, MD;  Location: [facility];  Service: General;  Laterality: Right;   POLYPECTOMY  06/16/2021   Procedure: POLYPECTOMY;  Surgeon: Daneil Dolin, MD;  [facility];  Service: Endoscopy;;   SIMPLE MASTECTOMY WITH AXILLARY SENTINEL NODE  BIOPSY Left 06/15/2020   Procedure: LEFT SIMPLE MASTECTOMY;  Surgeon: Aviva Signs, MD;  Location: [facility];  Service: General;  Laterality: Left;    Social History: Social History   Socioeconomic History   Marital status: Married    Spouse name: Not on file   Number of children: Not on file   Years of education: Not on file   Highest education level: Not on file  Occupational History   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[id number]  Tobacco Use   Smoking status: Never    Passive exposure: Never   Smokeless tobacco: Never  Vaping Use   Vaping Use: Never used  Substance and Sexual Activity   Alcohol use: No   Drug use: No   Sexual activity: Yes    Birth control/protection: Surgical  Other Topics Concern   Not on file  Social History Narrative   Works at [employer] in [place].    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   Also has to get items from high shelves-causes achy pain in shoulder area      Married.   Children are grown, out of house.   Social Determinants of Health   Financial Resource Strain: Low Risk  (10/10/2022)   Overall Financial Resource Strain (CARDIA)    Difficulty of Paying Living Expenses: Not hard at all  Food Insecurity: No Food Insecurity (10/10/2022)   Hunger Vital Sign    Worried About Running Out of Food in the Last Year: Never true    Ran Out of Food in the Last Year: Never true  Transportation Needs: No Transportation Needs (09/14/2022)   PRAPARE - Hydrologist (Medical): No    Lack of Transportation (Non-Medical): No  Physical Activity: Inactive (09/14/2022)   Exercise Vital Sign    Days of Exercise per Week: 0 days    Minutes of Exercise per Session: 0 min  Stress: No Stress Concern Present (09/14/2022)   [place]    Feeling of Stress : Not at all  Social Connections: [place] (09/14/2022)   Social Connection and  Isolation Panel [NHANES]    Frequency of Communication with Friends and Family: More than three times a week    Frequency of Social Gatherings with Friends and Family: Three times a week  Attends Religious Services: More than 4 times per year    Active Member of Clubs or Organizations: Yes    Attends Archivist Meetings: More than 4 times per year    Marital Status: Married  Human resources officer Violence: Not At Risk (09/14/2022)   Humiliation, Afraid, Rape, and Kick questionnaire    Fear of Current or Ex-Partner: No    Emotionally Abused: No    Physically Abused: No    Sexually Abused: No    Family History: Family History  Problem Relation Age of Onset   Hypertension Mother    Coronary artery disease Mother    Diabetes Mother    Hypertension Sister    Coronary artery disease Sister    Hypertension Brother    Heart attack Father    Hypertension Son    Heart attack Maternal Aunt    Hypertension Maternal Aunt    Diabetes Maternal Aunt    Heart attack Maternal Uncle    Hypertension Maternal Uncle    Diabetes Maternal Uncle    Heart attack Paternal Aunt    Hypertension Paternal Aunt    Diabetes Paternal Aunt    Heart attack Paternal Uncle    Hypertension Paternal Uncle    Diabetes Paternal Uncle    Heart attack Maternal Grandmother    Heart attack Maternal Grandfather    Heart attack Paternal Grandmother    Heart attack Paternal Grandfather    Colon cancer Neg Hx     Current Medications:  Current Outpatient Medications:    amLODipine (NORVASC) 10 MG tablet, TAKE 1 TABLET BY MOUTH AT BEDTIME, Disp: 90 tablet, Rfl: 3   anastrozole (ARIMIDEX) 1 MG tablet, TAKE 1 TABLET(1 MG) BY MOUTH DAILY, Disp: 30 tablet, Rfl: 6   aspirin EC 81 MG tablet, Take 81 mg by mouth daily., Disp: , Rfl:    atorvastatin (LIPITOR) 20 MG tablet, Take 1 tablet (20 mg total) by mouth daily. TAKE 1 TABLET DAILY, Disp: 90 tablet, Rfl: 3   B Complex-C-Zn-Folic Acid (DIALYVITE [id number]) 0.8 MG  TABS, Take 1 tablet by mouth daily., Disp: , Rfl:    cinacalcet (SENSIPAR) 30 MG tablet, Take 2 tablets (60 mg total) by mouth daily. (Patient taking differently: Take 30 mg by mouth daily.), Disp: 90 tablet, Rfl: 0   Darbepoetin Alfa (ARANESP, ALBUMIN FREE, IJ), Darbepoetin Alfa (Aranesp), Disp: , Rfl:    DULoxetine (CYMBALTA) 30 MG capsule, TAKE 1 CAPSULE(30 MG) BY MOUTH DAILY (Patient taking differently: Take 30 mg by mouth daily.), Disp: 90 capsule, Rfl: 1   fluticasone (FLONASE) 50 MCG/ACT nasal spray, Place 2 sprays into both nostrils daily., Disp: 16 g, Rfl: 6   gabapentin (NEURONTIN) 300 MG capsule, TAKE 1 CAPSULE(300 MG) BY MOUTH AT BEDTIME, Disp: 90 capsule, Rfl: 1   isosorbide mononitrate (IMDUR) 30 MG 24 hr tablet, Take 1.5 tablets (45 mg total) by mouth daily., Disp: 135 tablet, Rfl: 3   lanthanum (FOSRENOL) 1000 MG chewable tablet, Chew 2,000-3,000 mg by mouth See admin instructions. Take 3 tablets (3000 mg) by mouth with meals and take 2 tablets (2000 mg) with snacks, Disp: , Rfl:    lidocaine-prilocaine (EMLA) cream, Apply 1 application  topically every Monday, Wednesday, and Friday with hemodialysis., Disp: , Rfl:    meclizine (ANTIVERT) 25 MG tablet, TAKE 1 TABLET(25 MG) BY MOUTH THREE TIMES DAILY AS NEEDED FOR DIZZINESS, Disp: 30 tablet, Rfl: 1   nebivolol (BYSTOLIC) 10 MG tablet, Take 1 tablet (10 mg total) by mouth in the  morning and at bedtime., Disp: 180 tablet, Rfl: 3   nitroGLYCERIN (NITROSTAT) 0.4 MG SL tablet, Place 1 tablet (0.4 mg total) under the tongue every 5 (five) minutes x 3 doses as needed for chest pain (if no relief after 3rd dose, proceed to ED or call 911)., Disp: 25 tablet, Rfl: 3   oxyCODONE-acetaminophen (PERCOCET/ROXICET) 5-325 MG tablet, , Disp: , Rfl:    pantoprazole (PROTONIX) 40 MG tablet, TAKE 1 TABLET(40 MG) BY MOUTH DAILY, Disp: 90 tablet, Rfl: 0   sucralfate (CARAFATE) 1 GM/10ML suspension, Take 10 mLs (1 g total) by mouth with breakfast, with lunch,  and with evening meal., Disp: 420 mL, Rfl: 2   Allergies: No Known Allergies  REVIEW OF SYSTEMS:   Review of Systems  Constitutional:  Negative for chills, fatigue and fever.  HENT:   Negative for lump/mass, mouth sores, nosebleeds, sore throat and trouble swallowing.   Eyes:  Negative for eye problems.  Respiratory:  Positive for cough. Negative for shortness of breath.   Cardiovascular:  Negative for chest pain, leg swelling and palpitations.  Gastrointestinal:  Positive for nausea. Negative for abdominal pain, constipation, diarrhea and vomiting.  Genitourinary:  Negative for bladder incontinence, dysuria, frequency, hematuria and nocturia.        + dialysis  Musculoskeletal:  Positive for myalgias (right axillary pain, 7/10 in severity). Negative for arthralgias, back pain, flank pain and neck pain.  Skin:  Negative for itching and rash.  Neurological:  Positive for dizziness and headaches. Negative for numbness.       + tingling in hands and feet  Hematological:  Does not bruise/bleed easily.  Psychiatric/Behavioral:  Negative for depression, sleep disturbance and suicidal ideas. The patient is not nervous/anxious.   All other systems reviewed and are negative.    VITALS:   Blood pressure 120/71, pulse 81, temperature (!) 97.1 F (36.2 C), temperature source Tympanic, resp. rate 18, height 5' 1.5" (1.562 m), weight 183 lb 4.8 oz (83.1 kg), SpO2 98 %.  Wt Readings from Last 3 Encounters:  11/07/22 183 lb 4.8 oz (83.1 kg)  10/20/22 180 lb (81.6 kg)  09/26/22 182 lb (82.6 kg)    Body mass index is 34.07 kg/m.  Performance status (ECOG): 1 - Symptomatic but completely ambulatory  PHYSICAL EXAM:   Physical Exam Vitals and nursing note reviewed. Exam conducted with a chaperone present.  Constitutional:      Appearance: Normal appearance.  Cardiovascular:     Rate and Rhythm: Normal rate and regular rhythm.     Pulses: Normal pulses.     Heart sounds: Normal heart  sounds.  Pulmonary:     Effort: Pulmonary effort is normal.     Breath sounds: Normal breath sounds.  Chest:     Comments: Scar tissue in right anterior axillary line Right axilla tenderness Abdominal:     Palpations: Abdomen is soft. There is no hepatomegaly, splenomegaly or mass.     Tenderness: There is no abdominal tenderness.  Musculoskeletal:     Right lower leg: No edema.     Left lower leg: No edema.  Lymphadenopathy:     Cervical: No cervical adenopathy.     Right cervical: No superficial, deep or posterior cervical adenopathy.    Left cervical: No superficial, deep or posterior cervical adenopathy.     Upper Body:     Right upper body: No supraclavicular or axillary adenopathy.     Left upper body: No supraclavicular or axillary adenopathy.  Neurological:  General: No focal deficit present.     Mental Status: She is alert and oriented to person, place, and time.  Psychiatric:        Mood and Affect: Mood normal.        Behavior: Behavior normal.    Breast Exam Chaperone: Anastasio Champion, RN  LABS:      Latest Ref Rng & Units 09/12/2022   11:20 AM 08/15/2022   10:45 AM 07/04/2022    8:02 AM  CBC  WBC 4.0 - 10.5 K/uL 7.6  8.7  8.4   Hemoglobin 12.0 - 15.0 g/dL 11.2  10.6  11.9   Hematocrit 36.0 - 46.0 % 34.6  32.2  36.4   Platelets 150 - 400 K/uL 269  273  273       Latest Ref Rng & Units 09/12/2022   11:20 AM 08/15/2022   10:45 AM 07/04/2022    8:02 AM  CMP  Glucose 70 - 99 mg/dL 96  110  130   BUN 6 - 20 mg/dL 46  64  27   Creatinine 0.44 - 1.00 mg/dL 10.35  12.42  6.86   Sodium 135 - 145 mmol/L 138  139  139   Potassium 3.5 - 5.1 mmol/L 3.8  3.6  3.4   Chloride 98 - 111 mmol/L 96  97  97   CO2 22 - 32 mmol/L 27  26  31    Calcium 8.9 - 10.3 mg/dL 9.8  9.4  9.7   Total Protein 6.5 - 8.1 g/dL 7.3   8.3   Total Bilirubin 0.3 - 1.2 mg/dL 0.7   0.8   Alkaline Phos 38 - 126 U/L 90   94   AST 15 - 41 U/L 26   19   ALT 0 - 44 U/L 27   22      No  results found for: "CEA1", "CEA" / No results found for: "CEA1", "CEA" No results found for: "PSA1" No results found for: "[id number]" No results found for: "CAN125"  No results found for: "TOTALPROTELP", "ALBUMINELP", "A1GS", "A2GS", "BETS", "BETA2SER", "GAMS", "MSPIKE", "SPEI" Lab Results  Component Value Date   TIBC 195 (L) 02/15/2021   TIBC 190 (L) 12/10/2018   TIBC 218 (L) 06/08/2016   FERRITIN 1,742 (H) 02/15/2021   FERRITIN 1,308 (H) 12/10/2018   FERRITIN 126 06/08/2016   IRONPCTSAT 15 02/15/2021   IRONPCTSAT 43 (H) 12/10/2018   IRONPCTSAT 22 06/08/2016   No results found for: "LDH"   STUDIES:   No results found.

## 2022-11-08 DIAGNOSIS — D631 Anemia in chronic kidney disease: Secondary | ICD-10-CM | POA: Diagnosis not present

## 2022-11-08 DIAGNOSIS — N186 End stage renal disease: Secondary | ICD-10-CM | POA: Diagnosis not present

## 2022-11-08 DIAGNOSIS — Z992 Dependence on renal dialysis: Secondary | ICD-10-CM | POA: Diagnosis not present

## 2022-11-08 DIAGNOSIS — E1129 Type 2 diabetes mellitus with other diabetic kidney complication: Secondary | ICD-10-CM | POA: Diagnosis not present

## 2022-11-08 DIAGNOSIS — N2581 Secondary hyperparathyroidism of renal origin: Secondary | ICD-10-CM | POA: Diagnosis not present

## 2022-11-08 DIAGNOSIS — I159 Secondary hypertension, unspecified: Secondary | ICD-10-CM | POA: Diagnosis not present

## 2022-11-09 ENCOUNTER — Encounter: Payer: Self-pay | Admitting: Family Medicine

## 2022-11-09 ENCOUNTER — Ambulatory Visit (INDEPENDENT_AMBULATORY_CARE_PROVIDER_SITE_OTHER): Payer: Medicare Other | Admitting: Family Medicine

## 2022-11-09 VITALS — BP 118/70 | HR 90 | Temp 98.1°F | Ht 61.0 in | Wt 181.0 lb

## 2022-11-09 DIAGNOSIS — Z9049 Acquired absence of other specified parts of digestive tract: Secondary | ICD-10-CM | POA: Diagnosis not present

## 2022-11-09 DIAGNOSIS — R0789 Other chest pain: Secondary | ICD-10-CM | POA: Diagnosis not present

## 2022-11-09 LAB — TROPONIN I: Troponin I: 27 ng/L (ref <=47)

## 2022-11-09 MED ORDER — ONDANSETRON HCL 4 MG PO TABS: 4.0000 mg | ORAL_TABLET | Freq: Three times a day (TID) | ORAL | 0 refills | Status: DC | PRN

## 2022-11-09 NOTE — Assessment & Plan Note (Signed)
Not ill appearing, this is chronic intermittent chest pain. EKG unchanged. Will draw STAT troponins. Reinforced how to use nitro and encouraged to call EMS for acute chest pain.

## 2022-11-09 NOTE — Progress Notes (Signed)
Acute Office Visit  Subjective:     Patient ID: AMIRACLE RZONCA, female    DOB: 1961-02-07, 62 y.o.   MRN: [id number]  Chief Complaint  Patient presents with   Follow-up    nausea; L ear feels like something is in it (congestion?); flonase not helping. - JBG\\\     HPI Patient is in today for 1 week of nausea after vomiting one time and diarrhea last Thursday.She has been able to keep food down but has had decreased appetite due to the nausea. Bowel movements have returned to normal, no blood in stool or hematemesis, endorses chronic epigastric abdominal pain. left ear congestion and fullness sensation. She is also reporting feeling of chest pressure like someone is sitting on her chest since yesterday. This has been intermittent for several months and is unchanged. She has had workup for this and seen cardiology.  Review of Systems  All other systems reviewed and are negative.   Past Medical History:  Diagnosis Date   (HFpEF) heart failure with preserved ejection fraction ([place])    a. 01/2019 Echo: EF 55-60%, mild conc LVH. DD.  Torn MV chordae.   Anemia    Atypical chest pain    a. 08/2018 MV: EF 59%, no ischemia; b. 02/2019 Cath: nonobs dzs.   Blood transfusion without reported diagnosis    Breast cancer ([place]) 10/12/2020   Cataract    ESRD (end stage renal disease) on dialysis [facility])    a. HD T, T, S   Essential hypertension, benign    GERD (gastroesophageal reflux disease)    Headache    Hemorrhoids    Mixed hyperlipidemia    Morbid obesity (HCC)    Non-obstructive CAD (coronary artery disease)    a. 02/2019 CathL LM nl, LAD 99m, LCX nl, RCA 25p, EF 55-65%.   PONV (postoperative nausea and vomiting)    S/P colonoscopy Jan 2011   Dr. Benson Norway: sessile polyp (benign lymphoid), large hemorrhoids, repeat 5-10 years   Temporal arteritis ([place])    Type 2 diabetes mellitus ([place])    Wears glasses    Past Surgical History:  Procedure Laterality Date   ABDOMINAL HYSTERECTOMY      APPENDECTOMY     ARTERY BIOPSY N/A 05/09/2018   Procedure: RIGHT TEMPORAL ARTERY BIOPSY;  Surgeon: Judeth Horn, MD;  Location: [place];  Service: General;  Laterality: N/A;   BREAST BIOPSY Right 06/15/2020   Procedure: RIGHT BREAST BIOPSY;  Surgeon: Aviva Signs, MD;  Location: [facility];  Service: General;  Laterality: Right;   CATARACT EXTRACTION W/PHACO Left 02/09/2017   Procedure: CATARACT EXTRACTION PHACO AND INTRAOCULAR LENS PLACEMENT LEFT EYE;  Surgeon: Tonny Branch, MD;  Location: [facility];  Service: Ophthalmology;  Laterality: Left;  CDE: 4.89   CATARACT EXTRACTION W/PHACO Right 06/04/2017   Procedure: CATARACT EXTRACTION PHACO AND INTRAOCULAR LENS PLACEMENT (IOC);  Surgeon: Tonny Branch, MD;  Location: [facility];  Service: Ophthalmology;  Laterality: Right;  CDE: 4.12   CHOLECYSTECTOMY  09/29/2011   Procedure: LAPAROSCOPIC CHOLECYSTECTOMY;  Surgeon: Jamesetta So, MD;  Location: [facility];  Service: General;  Laterality: N/A;   COLONOSCOPY  08/2009   Dr. Benson Norway: sessile polyp (benign lymphoid), large hemorrhoids, repeat 5-10 years   COLONOSCOPY N/A 06/12/2016   prominent hemorrhoids   COLONOSCOPY WITH PROPOFOL N/A 06/16/2021   Procedure: COLONOSCOPY WITH PROPOFOL;  Surgeon: Daneil Dolin, MD;  [facility];  Service: Endoscopy;  Laterality: N/A;  9:30am (dialysis pt)   ESOPHAGOGASTRODUODENOSCOPY  09/05/2011  [id number] hiatal hernia; remainder of exam normal. No explanation for patient's abdominal pain with today's examination   ESOPHAGOGASTRODUODENOSCOPY N/A 12/17/2013   Dr. [name]: gastric erythema, erosion, mild chronic inflammation on path    ESOPHAGOGASTRODUODENOSCOPY (EGD) WITH PROPOFOL N/A 06/16/2021   Procedure: ESOPHAGOGASTRODUODENOSCOPY (EGD) WITH PROPOFOL;  Surgeon: Daneil Dolin, MD;  [facility];  Service: Endoscopy;  Laterality: N/A;   EXCISION OF BREAST BIOPSY Right 10/12/2020   Procedure: EXCISION OF RIGHT BREAST BIOPSY;  Surgeon: Aviva Signs, MD;   [facility];  Service: General;  Laterality: Right;   LAPAROSCOPIC APPENDECTOMY  09/29/2011   Procedure: APPENDECTOMY LAPAROSCOPIC;  Surgeon: Jamesetta So, MD;  [facility];  Service: General;;  incidental appendectomy   LEFT HEART CATH AND CORONARY ANGIOGRAPHY N/A 02/28/2019   Procedure: LEFT HEART CATH AND CORONARY ANGIOGRAPHY;  Surgeon: Jettie Booze, MD;  Location: [place] [facility];  Service: Cardiovascular;  Laterality: N/A;   MASTECTOMY MODIFIED RADICAL Right 02/18/2020   Procedure: MASTECTOMY MODIFIED RADICAL;  Surgeon: Aviva Signs, MD;  [facility];  Service: General;  Laterality: Right;   MASTECTOMY, PARTIAL Right 07/13/2020   Procedure: RIGHT PARTIAL MASTECTOMY;  Surgeon: Aviva Signs, MD;  [facility];  Service: General;  Laterality: Right;   PARTIAL MASTECTOMY WITH NEEDLE LOCALIZATION AND AXILLARY SENTINEL LYMPH NODE BX Right 09/18/2018   Procedure: RIGHT PARTIAL MASTECTOMY AFTER NEEDLE LOCALIZATION, SENTINEL LYMPH NODE BIOPSY RIGHT AXILLA;  Surgeon: Aviva Signs, MD;  [facility];  Service: General;  Laterality: Right;   POLYPECTOMY  06/16/2021   Procedure: POLYPECTOMY;  Surgeon: Daneil Dolin, MD;  [facility];  Service: Endoscopy;;   SIMPLE MASTECTOMY WITH AXILLARY SENTINEL NODE BIOPSY Left 06/15/2020   Procedure: LEFT SIMPLE MASTECTOMY;  Surgeon: Aviva Signs, MD;  Location[facility];  Service: General;  Laterality: Left;   Current Outpatient Medications on File Prior to Visit  Medication Sig Dispense Refill   amLODipine (NORVASC) 10 MG tablet TAKE 1 TABLET BY MOUTH AT BEDTIME 90 tablet 3   anastrozole (ARIMIDEX) 1 MG tablet TAKE 1 TABLET(1 MG) BY MOUTH DAILY 30 tablet 6   aspirin EC 81 MG tablet Take 81 mg by mouth daily.     atorvastatin (LIPITOR) 20 MG tablet Take 1 tablet (20 mg total) by mouth daily. TAKE 1 TABLET DAILY 90 tablet 3   B Complex-C-Zn-Folic Acid (DIALYVITE [id number]) 0.8 MG TABS Take 1 tablet by mouth  daily.     cinacalcet (SENSIPAR) 30 MG tablet Take 2 tablets (60 mg total) by mouth daily. (Patient taking differently: Take 30 mg by mouth daily.) 90 tablet 0   Darbepoetin Alfa (ARANESP, ALBUMIN FREE, IJ) Darbepoetin Alfa (Aranesp)     DULoxetine (CYMBALTA) 30 MG capsule TAKE 1 CAPSULE(30 MG) BY MOUTH DAILY (Patient taking differently: Take 30 mg by mouth daily.) 90 capsule 1   fluticasone (FLONASE) 50 MCG/ACT nasal spray Place 2 sprays into both nostrils daily. 16 g 6   gabapentin (NEURONTIN) 300 MG capsule TAKE 1 CAPSULE(300 MG) BY MOUTH AT BEDTIME 90 capsule 1   isosorbide mononitrate (IMDUR) 30 MG 24 hr tablet Take 1.5 tablets (45 mg total) by mouth daily. 135 tablet 3   lanthanum (FOSRENOL) 1000 MG chewable tablet Chew 2,000-3,000 mg by mouth See admin instructions. Take 3 tablets (3000 mg) by mouth with meals and take 2 tablets (2000 mg) with snacks     lidocaine-prilocaine (EMLA) cream Apply 1 application  topically every Monday, Wednesday, and Friday with  hemodialysis.     meclizine (ANTIVERT) 25 MG tablet TAKE 1 TABLET(25 MG) BY MOUTH THREE TIMES DAILY AS NEEDED FOR DIZZINESS 30 tablet 1   nebivolol (BYSTOLIC) 10 MG tablet Take 1 tablet (10 mg total) by mouth in the morning and at bedtime. 180 tablet 3   nitroGLYCERIN (NITROSTAT) 0.4 MG SL tablet Place 1 tablet (0.4 mg total) under the tongue every 5 (five) minutes x 3 doses as needed for chest pain (if no relief after 3rd dose, proceed to ED or call 911). 25 tablet 3   oxyCODONE-acetaminophen (PERCOCET/ROXICET) 5-325 MG tablet      pantoprazole (PROTONIX) 40 MG tablet TAKE 1 TABLET(40 MG) BY MOUTH DAILY 90 tablet 0   sucralfate (CARAFATE) 1 GM/10ML suspension Take 10 mLs (1 g total) by mouth with breakfast, with lunch, and with evening meal. 420 mL 2   No current facility-administered medications on file prior to visit.   No Known Allergies      Objective:    BP 118/70   Pulse 90   Temp 98.1 F (36.7 C) (Oral)   Ht 5\' 1"   (1.549 m)   Wt 181 lb (82.1 kg)   SpO2 95%   BMI 34.20 kg/m    Physical Exam Vitals and nursing note reviewed.  Constitutional:      Appearance: Normal appearance. She is normal weight.  HENT:     Head: Normocephalic and atraumatic.     Right Ear: Tympanic membrane, ear canal and external ear normal. There is impacted cerumen.     Left Ear: Tympanic membrane, ear canal and external ear normal.  Cardiovascular:     Rate and Rhythm: Normal rate and regular rhythm.     Pulses: Normal pulses.     Heart sounds: Normal heart sounds.  Pulmonary:     Effort: Pulmonary effort is normal.     Breath sounds: Normal breath sounds.  Skin:    General: Skin is warm and dry.  Neurological:     General: No focal deficit present.     Mental Status: She is alert and oriented to person, place, and time. Mental status is at baseline.  Psychiatric:        Mood and Affect: Mood normal.        Behavior: Behavior normal.        Thought Content: Thought content normal.        Judgment: Judgment normal.     No results found for any visits on 11/09/22.      Assessment & Plan:   Problem List Items Addressed This Visit       Other   Chest pressure - Primary    Not ill appearing, this is chronic intermittent chest pain. EKG unchanged. Will draw STAT troponins. Reinforced how to use nitro and encouraged to call EMS for acute chest pain.      Relevant Orders   EKG 12-Lead (Completed)   Troponin I -   History of cholecystectomy    Recurrent nausea, encouraged more appropriate diet given she has a history of chole to include avoiding fatty, greasy, processed, and sugary food and increasing fiber, lean meats, fruits and vegetables. Encouraged bland diet and will order Zofran 4mg  q88h PRN for nausea.        Meds ordered this encounter  Medications   ondansetron (ZOFRAN) 4 MG tablet    Sig: Take 1 tablet (4 mg total) by mouth every 8 (eight) hours as needed for nausea or vomiting.  Dispense:   10 tablet    Refill:  0    Order Specific Question:   Supervising Provider    Answer:   Jenna Luo T [id number]    Return if symptoms worsen or fail to improve.  Rubie Maid, FNP

## 2022-11-09 NOTE — Assessment & Plan Note (Signed)
Recurrent nausea, encouraged more appropriate diet given she has a history of chole to include avoiding fatty, greasy, processed, and sugary food and increasing fiber, lean meats, fruits and vegetables. Encouraged bland diet and will order Zofran 4mg  q88h PRN for nausea.

## 2022-11-09 NOTE — Addendum Note (Signed)
Addended by: Rubie Maid on: 11/09/2022 12:23 PM   Modules accepted: Level of Service

## 2022-11-10 DIAGNOSIS — N186 End stage renal disease: Secondary | ICD-10-CM | POA: Diagnosis not present

## 2022-11-10 DIAGNOSIS — N2581 Secondary hyperparathyroidism of renal origin: Secondary | ICD-10-CM | POA: Diagnosis not present

## 2022-11-10 DIAGNOSIS — E1129 Type 2 diabetes mellitus with other diabetic kidney complication: Secondary | ICD-10-CM | POA: Diagnosis not present

## 2022-11-10 DIAGNOSIS — Z992 Dependence on renal dialysis: Secondary | ICD-10-CM | POA: Diagnosis not present

## 2022-11-10 DIAGNOSIS — D631 Anemia in chronic kidney disease: Secondary | ICD-10-CM | POA: Diagnosis not present

## 2022-11-10 DIAGNOSIS — I159 Secondary hypertension, unspecified: Secondary | ICD-10-CM | POA: Diagnosis not present

## 2022-11-13 DIAGNOSIS — Z992 Dependence on renal dialysis: Secondary | ICD-10-CM | POA: Diagnosis not present

## 2022-11-13 DIAGNOSIS — M79621 Pain in right upper arm: Secondary | ICD-10-CM | POA: Diagnosis not present

## 2022-11-13 DIAGNOSIS — Z170 Estrogen receptor positive status [ER+]: Secondary | ICD-10-CM | POA: Diagnosis not present

## 2022-11-13 DIAGNOSIS — N186 End stage renal disease: Secondary | ICD-10-CM | POA: Diagnosis not present

## 2022-11-13 DIAGNOSIS — E1129 Type 2 diabetes mellitus with other diabetic kidney complication: Secondary | ICD-10-CM | POA: Diagnosis not present

## 2022-11-13 DIAGNOSIS — N2581 Secondary hyperparathyroidism of renal origin: Secondary | ICD-10-CM | POA: Diagnosis not present

## 2022-11-13 DIAGNOSIS — C50211 Malignant neoplasm of upper-inner quadrant of right female breast: Secondary | ICD-10-CM | POA: Diagnosis not present

## 2022-11-13 DIAGNOSIS — Z853 Personal history of malignant neoplasm of breast: Secondary | ICD-10-CM | POA: Diagnosis not present

## 2022-11-13 DIAGNOSIS — D631 Anemia in chronic kidney disease: Secondary | ICD-10-CM | POA: Diagnosis not present

## 2022-11-13 DIAGNOSIS — N040 Nephrotic syndrome with minor glomerular abnormality: Secondary | ICD-10-CM | POA: Diagnosis not present

## 2022-11-13 DIAGNOSIS — E119 Type 2 diabetes mellitus without complications: Secondary | ICD-10-CM | POA: Diagnosis not present

## 2022-11-13 DIAGNOSIS — I159 Secondary hypertension, unspecified: Secondary | ICD-10-CM | POA: Diagnosis not present

## 2022-11-14 ENCOUNTER — Ambulatory Visit (HOSPITAL_COMMUNITY)
Admission: RE | Admit: 2022-11-14 | Discharge: 2022-11-14 | Disposition: A | Discharge status: Home / Self Care | Payer: Medicare Other | Source: Ambulatory Visit | Attending: Hematology | Admitting: Hematology

## 2022-11-14 DIAGNOSIS — M79621 Pain in right upper arm: Secondary | ICD-10-CM | POA: Diagnosis not present

## 2022-11-14 DIAGNOSIS — C50211 Malignant neoplasm of upper-inner quadrant of right female breast: Secondary | ICD-10-CM | POA: Insufficient documentation

## 2022-11-14 DIAGNOSIS — Z170 Estrogen receptor positive status [ER+]: Secondary | ICD-10-CM | POA: Diagnosis not present

## 2022-11-14 DIAGNOSIS — Z853 Personal history of malignant neoplasm of breast: Secondary | ICD-10-CM | POA: Diagnosis not present

## 2022-11-15 DIAGNOSIS — D631 Anemia in chronic kidney disease: Secondary | ICD-10-CM | POA: Diagnosis not present

## 2022-11-15 DIAGNOSIS — E1129 Type 2 diabetes mellitus with other diabetic kidney complication: Secondary | ICD-10-CM | POA: Diagnosis not present

## 2022-11-15 DIAGNOSIS — N2581 Secondary hyperparathyroidism of renal origin: Secondary | ICD-10-CM | POA: Diagnosis not present

## 2022-11-15 DIAGNOSIS — N186 End stage renal disease: Secondary | ICD-10-CM | POA: Diagnosis not present

## 2022-11-15 DIAGNOSIS — I159 Secondary hypertension, unspecified: Secondary | ICD-10-CM | POA: Diagnosis not present

## 2022-11-15 DIAGNOSIS — Z992 Dependence on renal dialysis: Secondary | ICD-10-CM | POA: Diagnosis not present

## 2022-11-17 DIAGNOSIS — I159 Secondary hypertension, unspecified: Secondary | ICD-10-CM | POA: Diagnosis not present

## 2022-11-17 DIAGNOSIS — N2581 Secondary hyperparathyroidism of renal origin: Secondary | ICD-10-CM | POA: Diagnosis not present

## 2022-11-17 DIAGNOSIS — N186 End stage renal disease: Secondary | ICD-10-CM | POA: Diagnosis not present

## 2022-11-17 DIAGNOSIS — E1129 Type 2 diabetes mellitus with other diabetic kidney complication: Secondary | ICD-10-CM | POA: Diagnosis not present

## 2022-11-17 DIAGNOSIS — Z992 Dependence on renal dialysis: Secondary | ICD-10-CM | POA: Diagnosis not present

## 2022-11-17 DIAGNOSIS — D631 Anemia in chronic kidney disease: Secondary | ICD-10-CM | POA: Diagnosis not present

## 2022-11-20 DIAGNOSIS — E1129 Type 2 diabetes mellitus with other diabetic kidney complication: Secondary | ICD-10-CM | POA: Diagnosis not present

## 2022-11-20 DIAGNOSIS — Z992 Dependence on renal dialysis: Secondary | ICD-10-CM | POA: Diagnosis not present

## 2022-11-20 DIAGNOSIS — N2581 Secondary hyperparathyroidism of renal origin: Secondary | ICD-10-CM | POA: Diagnosis not present

## 2022-11-20 DIAGNOSIS — I159 Secondary hypertension, unspecified: Secondary | ICD-10-CM | POA: Diagnosis not present

## 2022-11-20 DIAGNOSIS — D631 Anemia in chronic kidney disease: Secondary | ICD-10-CM | POA: Diagnosis not present

## 2022-11-20 DIAGNOSIS — N186 End stage renal disease: Secondary | ICD-10-CM | POA: Diagnosis not present

## 2022-11-20 NOTE — Progress Notes (Signed)
[facility] [address] [place] [address]    Clinic Day:  11/21/2022  Referring physician: Donita Brooks, MD  Patient Care Team: Donita Brooks, MD as PCP - General (Family Medicine) [name] [name], MD as PCP - Cardiology (Cardiology) [name], MD (Gastroenterology) [name], MD as Consulting Physician (Cardiology) [name], MD as Medical Oncologist (Medical Oncology) [name], MD as Consulting Physician (Nephrology)   ASSESSMENT & PLAN:   Assessment: 1.  Stage Ib (T1CN0) right breast cancer, ER positive, PR and HER-2 negative: -Right lumpectomy and sentinel lymph node biopsy on 09/18/2018 shows 1.5 cm IDC, grade 3, associated with high-grade DCIS, negative margins, 0/3 lymph nodes positive, ER 50%, PR negative and HER-2 negative, Ki-67 60%. -Oncotype DX recurrence score 47.  Distant recurrence at 9 years with tamoxifen alone is 36%.  Absolute chemotherapy benefit is more than 15%. -Adjuvant chemotherapy with 4 cycles of AC from 10/30/2018 through 01/01/2019. -She was started on anastrozole in June 2020. -Mammogram on 09/02/2019 showed calcifications in the posterior aspect of upper outer quadrant of the right breast. -Right partial mastectomy on 02/18/2020 shows high-grade DCIS, 1.5 cm, no invasive carcinoma, resection margins negative.  0/11 lymph nodes.  ER 30% positive, PR negative. -Left simple mastectomy on 06/15/2020 was benign.  Right breast biopsy shows microscopic focus of invasive ductal carcinoma in the background of extensive high-grade DCIS. -Right breast lumpectomy on 07/13/2020 shows multifocal invasive ductal carcinoma, grade 3, largest measuring 1.2 cm.  Extensive high-grade DCIS with necrosis.  Margins are free.  PT1CNX.  There are multiple foci of invasive carcinoma arising from extensive DCIS.  Several small foci of invasive tumor arising from DCIS indicating new primaries.  ER/PR/HER-2 is negative.   Ki-67 is 20%. -PET scan on 09/14/2020 shows areas of nodularity with discrete nodule in the fat of the inferior chest wall within the subcutaneous tissues. -Ultrasound of the right chest wall shows masslike abnormality in the upper inner aspect of the right anterior chest at 1 o'clock position measuring 4.1 cm in greatest dimension.  1.7 cm hypoechoic mass in the central anterior right chest concerning for malignancy.  Possible borderline enlarged residual right axillary lymph node. -Right breast biopsy on 10/12/2020 shows 1.3 cm invasive ductal carcinoma, focally 0.1 cm from superior margin.  DCIS is less than 0.1 cm from superior margin.  PT1CPNX. - Right mastectomy followed by radiation therapy completed in May 2022.   2.  Osteopenia: -Bone density on 01/20/2019 shows T score -1.9. - DEXA scan (03/24/2021) T score -2.4   Plan: 1.  Recurrent right breast cancer, triple receptor negative: - She is tolerating anastrozole very well. - At last visit she complained of right axillary region pain.  No clear palpable masses but skin thickening was present. - We reviewed ultrasound of the right chest wall and axilla.  It showed thickening felt to be related to musculature with no suspicious masses or abnormality. - However she reports that pain has been consistently present.  Neither gabapentin nor narcotics are helping. - Recommend PET CT scan and follow-up after that.  If PET is negative, will refer to pain management.     2.  Osteopenia/vitamin D deficiency: - Continue Nephro-Vite and vitamin D.   3.  ESRD on HD: - Continue HD on Monday, Wednesday and Friday under the direction of Dr. Candise Che do not.   4.  Neuropathy: - Continue gabapentin at bedtime.  Well-controlled.  Orders Placed This Encounter  Procedures   NM PET Image Restag (PS) Skull Base To Thigh    Standing Status:   Future    Standing Expiration Date:   11/21/2023    Order Specific Question:   If indicated for the ordered procedure, I  authorize the administration of a radiopharmaceutical per Radiology protocol    Answer:   Yes    Order Specific Question:   Is the patient pregnant?    Answer:   No    Order Specific Question:   Preferred imaging location?    Answer:   [facility]    Order Specific Question:   Release to patient    Answer:   Immediate      I,Katie Daubenspeck,acting as a scribe for [name], MD.,have documented all relevant documentation on the behalf of [name], MD,as directed by  [name], MD while in the presence of [name], MD.   I, [name] MD, have reviewed the above documentation for accuracy and completeness, and I agree with the above.   [name], MD   4/9/20245:44 PM  CHIEF COMPLAINT:   Diagnosis: right breast cancer    Cancer Staging  Malignant neoplasm of upper-inner quadrant of right female breast Staging form: Breast, AJCC 8th Edition - Clinical stage from 09/26/2018: Stage IB (cT1c, cN0, cM0, G3, ER+, PR-, HER2-) - Signed by [name], MD on 09/26/2018    Prior Therapy:  Right lumpectomy and sentinel lymph node biopsy on 09/18/2018  Adjuvant chemotherapy with 4 cycles of AC from 10/30/2018 through 01/01/2019  Right partial mastectomy on 02/18/2020  Right breast lumpectomy on 07/13/2020  Right mastectomy followed by radiation therapy completed in May 2022   Current Therapy:  anastrozole started June 2020    HISTORY OF PRESENT ILLNESS:   Oncology History  Malignant neoplasm of upper-inner quadrant of right female breast  09/18/2018 Initial Diagnosis   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)   09/26/2018 Cancer Staging   Staging form: Breast, AJCC 8th Edition - Clinical stage from 09/26/2018: Stage IB (cT1c, cN0, cM0, G3, ER+, PR-, HER2-) - Signed by [name], MD on 09/26/2018   10/30/2018 - 01/01/2019 Chemotherapy   Patient is on Treatment Plan : BREAST Adjuvant  AC q21d        INTERVAL HISTORY:   Shavonne is a 62 y.o. female presenting to clinic today for follow up of right breast cancer. She was last seen by me on 11/07/22.  Since her last visit, she underwent right axilla [place] on 11/14/22 showing no abnormalities in region of palpable concern.  Today, she states that she is doing well overall. Her appetite level is at 50%. Her energy level is at 50%.  PAST MEDICAL HISTORY:   Past Medical History: Past Medical History:  Diagnosis Date   (HFpEF) heart failure with preserved ejection fraction (HCC)    a. 01/2019 Echo: EF 55-60%, mild conc LVH. DD.  Torn MV chordae.   Anemia    Atypical chest pain    a. 08/2018 MV: EF 59%, no ischemia; b. 02/2019 Cath: nonobs dzs.   Blood transfusion without reported diagnosis    Breast cancer (HCC) 10/12/2020   Cataract    ESRD (end stage renal disease) on dialysis [facility])    a. HD T, T, S   Essential hypertension, benign    GERD (gastroesophageal reflux disease)    Headache    Hemorrhoids    Mixed hyperlipidemia    Morbid obesity (HCC)  Non-obstructive CAD (coronary artery disease)    a. 02/2019 CathL LM nl, LAD 17m, LCX nl, RCA 25p, EF 55-65%.   PONV (postoperative nausea and vomiting)    S/P colonoscopy Jan 2011   Dr. Elnoria Howard: sessile polyp (benign lymphoid), large hemorrhoids, repeat 5-10 years   Temporal arteritis (HCC)    Type 2 diabetes mellitus (HCC)    Wears glasses     Surgical History: Past Surgical History:  Procedure Laterality Date   ABDOMINAL HYSTERECTOMY     APPENDECTOMY     ARTERY BIOPSY N/A 05/09/2018   Procedure: RIGHT TEMPORAL ARTERY BIOPSY;  Surgeon: Jimmye Norman, MD;  Location: [facility] OR;  Service: General;  Laterality: N/A;   BREAST BIOPSY Right 06/15/2020   Procedure: RIGHT BREAST BIOPSY;  Surgeon: Franky Macho, MD;  Location: [facility] ORS;  Service: General;  Laterality: Right;   CATARACT EXTRACTION W/PHACO Left 02/09/2017   Procedure: CATARACT EXTRACTION PHACO AND INTRAOCULAR LENS  PLACEMENT LEFT EYE;  Surgeon: Gemma Payor, MD;  Location: [facility] OR[facility];  Service: Ophthalmology;  Laterality: Left;  CDE: 4.89   CATARACT EXTRACTION W/PHACO Right 06/04/2017   Procedure: CATARACT EXTRACTION PHACO AND INTRAOCULAR LENS PLACEMENT (IOC);  Surgeon: Gemma Payor, MD;  Location: [facility] OR[facility];  Service: Ophthalmology;  Laterality: Right;  CDE: 4.12   CHOLECYSTECTOMY  09/29/2011   Procedure: LAPAROSCOPIC CHOLECYSTECTOMY;  Surgeon: Dalia Heading, MD;  Location: [facility];  Service: General;  Laterality: N/A;   COLONOSCOPY  08/2009   Dr. Elnoria Howard: sessile polyp (benign lymphoid), large hemorrhoids, repeat 5-10 years   COLONOSCOPY N/A 06/12/2016   prominent hemorrhoids   COLONOSCOPY WITH PROPOFOL N/A 06/16/2021   Procedure: COLONOSCOPY WITH PROPOFOL;  Surgeon: Corbin Ade, MD;  Location: [facility];  Service: Endoscopy;  Laterality: N/A;  9:30am (dialysis pt)   ESOPHAGOGASTRODUODENOSCOPY  09/05/2011   [id number] hiatal hernia; remainder of exam normal. No explanation for patient's abdominal pain with today's examination   ESOPHAGOGASTRODUODENOSCOPY N/A 12/17/2013   Dr. Jena Gauss: gastric erythema, erosion, mild chronic inflammation on path    ESOPHAGOGASTRODUODENOSCOPY (EGD) WITH PROPOFOL N/A 06/16/2021   Procedure: ESOPHAGOGASTRODUODENOSCOPY (EGD) WITH PROPOFOL;  Surgeon: Corbin Ade, MD;  Location: [facility];  Service: Endoscopy;  Laterality: N/A;   EXCISION OF BREAST BIOPSY Right 10/12/2020   Procedure: EXCISION OF RIGHT BREAST BIOPSY;  Surgeon: Franky Macho, MD;  Location: [facility];  Service: General;  Laterality: Right;   LAPAROSCOPIC APPENDECTOMY  09/29/2011   Procedure: APPENDECTOMY LAPAROSCOPIC;  Surgeon: Dalia Heading, MD;  Location: [facility];  Service: General;;  incidental appendectomy   LEFT HEART CATH AND CORONARY ANGIOGRAPHY N/A 02/28/2019   Procedure: LEFT HEART CATH AND CORONARY ANGIOGRAPHY;  Surgeon: Corky Crafts, MD;  Location: [facility] INVASIVE CV LAB;  Service: Cardiovascular;   Laterality: N/A;   MASTECTOMY MODIFIED RADICAL Right 02/18/2020   Procedure: MASTECTOMY MODIFIED RADICAL;  Surgeon: Franky Macho, MD;  Location: [facility];  Service: General;  Laterality: Right;   MASTECTOMY, PARTIAL Right 07/13/2020   Procedure: RIGHT PARTIAL MASTECTOMY;  Surgeon: Franky Macho, MD;  Location: [facility];  Service: General;  Laterality: Right;   PARTIAL MASTECTOMY WITH NEEDLE LOCALIZATION AND AXILLARY SENTINEL LYMPH NODE BX Right 09/18/2018   Procedure: RIGHT PARTIAL MASTECTOMY AFTER NEEDLE LOCALIZATION, SENTINEL LYMPH NODE BIOPSY RIGHT AXILLA;  Surgeon: Franky Macho, MD;  Location: [facility];  Service: General;  Laterality: Right;   POLYPECTOMY  06/16/2021   Procedure: POLYPECTOMY;  Surgeon: Corbin Ade, MD;  Location: [facility];  Service: Endoscopy;;  SIMPLE MASTECTOMY WITH AXILLARY SENTINEL NODE BIOPSY Left 06/15/2020   Procedure: LEFT SIMPLE MASTECTOMY;  Surgeon: Franky Macho, MD;  Location: [facility];  Service: General;  Laterality: Left;    Social History: Social History   Socioeconomic History   Marital status: Married    Spouse name: Not on file   Number of children: Not on file   Years of education: Not on file   Highest education level: Not on file  Occupational History   Occupation: Systems developer: [employer]  Tobacco Use   Smoking status: Never    Passive exposure: Never   Smokeless tobacco: Never  Vaping Use   Vaping Use: Never used  Substance and Sexual Activity   Alcohol use: No   Drug use: No   Sexual activity: Yes    Birth control/protection: Surgical  Other Topics Concern   Not on file  Social History Narrative   Works at [employer] in [place].    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   Also has to get items from high shelves-causes achy pain in shoulder area      Married.   Children are grown, out of house.   Social Determinants of Health   Financial Resource Strain: Low Risk  (10/10/2022)   Overall  Financial Resource Strain (CARDIA)    Difficulty of Paying Living Expenses: Not hard at all  Food Insecurity: No Food Insecurity (10/10/2022)   Hunger Vital Sign    Worried About Running Out of Food in the Last Year: Never true    Ran Out of Food in the Last Year: Never true  Transportation Needs: No Transportation Needs (09/14/2022)   PRAPARE - Administrator, Civil Service (Medical): No    Lack of Transportation (Non-Medical): No  Physical Activity: Inactive (09/14/2022)   Exercise Vital Sign    Days of Exercise per Week: 0 days    Minutes of Exercise per Session: 0 min  Stress: No Stress Concern Present (09/14/2022)   [employer] of Occupational Health - Occupational Stress Questionnaire    Feeling of Stress : Not at all  Social Connections: Socially Integrated (09/14/2022)   Social Connection and Isolation Panel [NHANES]    Frequency of Communication with Friends and Family: More than three times a week    Frequency of Social Gatherings with Friends and Family: Three times a week    Attends Religious Services: More than 4 times per year    Active Member of Clubs or Organizations: Yes    Attends Banker Meetings: More than 4 times per year    Marital Status: Married  Catering manager Violence: Not At Risk (09/14/2022)   Humiliation, Afraid, Rape, and Kick questionnaire    Fear of Current or Ex-Partner: No    Emotionally Abused: No    Physically Abused: No    Sexually Abused: No    Family History: Family History  Problem Relation Age of Onset   Hypertension Mother    Coronary artery disease Mother    Diabetes Mother    Hypertension Sister    Coronary artery disease Sister    Hypertension Brother    Heart attack Father    Hypertension Son    Heart attack Maternal Aunt    Hypertension Maternal Aunt    Diabetes Maternal Aunt    Heart attack Maternal Uncle    Hypertension Maternal Uncle    Diabetes Maternal Uncle  Heart attack Paternal Aunt     Hypertension Paternal Aunt    Diabetes Paternal Aunt    Heart attack Paternal Uncle    Hypertension Paternal Uncle    Diabetes Paternal Uncle    Heart attack Maternal Grandmother    Heart attack Maternal Grandfather    Heart attack Paternal Grandmother    Heart attack Paternal Grandfather    Colon cancer Neg Hx     Current Medications:  Current Outpatient Medications:    amLODipine (NORVASC) 10 MG tablet, TAKE 1 TABLET BY MOUTH AT BEDTIME, Disp: 90 tablet, Rfl: 3   anastrozole (ARIMIDEX) 1 MG tablet, TAKE 1 TABLET(1 MG) BY MOUTH DAILY, Disp: 30 tablet, Rfl: 6   aspirin EC 81 MG tablet, Take 81 mg by mouth daily., Disp: , Rfl:    atorvastatin (LIPITOR) 20 MG tablet, Take 1 tablet (20 mg total) by mouth daily. TAKE 1 TABLET DAILY, Disp: 90 tablet, Rfl: 3   B Complex-C-Zn-Folic Acid (DIALYVITE 800-ZINC 15) 0.8 MG TABS, Take 1 tablet by mouth daily., Disp: , Rfl:    cinacalcet (SENSIPAR) 30 MG tablet, Take 2 tablets (60 mg total) by mouth daily. (Patient taking differently: Take 30 mg by mouth daily.), Disp: 90 tablet, Rfl: 0   Darbepoetin Alfa (ARANESP, ALBUMIN FREE, IJ), Darbepoetin Alfa (Aranesp), Disp: , Rfl:    fluticasone (FLONASE) 50 MCG/ACT nasal spray, Place 2 sprays into both nostrils daily., Disp: 16 g, Rfl: 6   gabapentin (NEURONTIN) 300 MG capsule, TAKE 1 CAPSULE(300 MG) BY MOUTH AT BEDTIME, Disp: 90 capsule, Rfl: 1   isosorbide mononitrate (IMDUR) 30 MG 24 hr tablet, Take 1.5 tablets (45 mg total) by mouth daily., Disp: 135 tablet, Rfl: 3   lanthanum (FOSRENOL) 1000 MG chewable tablet, Chew 2,000-3,000 mg by mouth See admin instructions. Take 3 tablets (3000 mg) by mouth with meals and take 2 tablets (2000 mg) with snacks, Disp: , Rfl:    meclizine (ANTIVERT) 25 MG tablet, TAKE 1 TABLET(25 MG) BY MOUTH THREE TIMES DAILY AS NEEDED FOR DIZZINESS, Disp: 30 tablet, Rfl: 1   nebivolol (BYSTOLIC) 10 MG tablet, Take 1 tablet (10 mg total) by mouth in the morning and at bedtime., Disp:  180 tablet, Rfl: 3   oxyCODONE-acetaminophen (PERCOCET/ROXICET) 5-325 MG tablet, , Disp: , Rfl:    pantoprazole (PROTONIX) 40 MG tablet, TAKE 1 TABLET(40 MG) BY MOUTH DAILY, Disp: 90 tablet, Rfl: 0   sucralfate (CARAFATE) 1 GM/10ML suspension, Take 10 mLs (1 g total) by mouth with breakfast, with lunch, and with evening meal., Disp: 420 mL, Rfl: 2   lidocaine-prilocaine (EMLA) cream, Apply 1 application  topically every Monday, Wednesday, and Friday with hemodialysis. (Patient not taking: Reported on 11/21/2022), Disp: , Rfl:    nitroGLYCERIN (NITROSTAT) 0.4 MG SL tablet, Place 1 tablet (0.4 mg total) under the tongue every 5 (five) minutes x 3 doses as needed for chest pain (if no relief after 3rd dose, proceed to ED or call 911). (Patient not taking: Reported on 11/21/2022), Disp: 25 tablet, Rfl: 3   ondansetron (ZOFRAN) 4 MG tablet, Take 1 tablet (4 mg total) by mouth every 8 (eight) hours as needed for nausea or vomiting. (Patient not taking: Reported on 11/21/2022), Disp: 10 tablet, Rfl: 0   Allergies: No Known Allergies  REVIEW OF SYSTEMS:   Review of Systems  Constitutional:  Negative for chills, fatigue and fever.  HENT:   Negative for lump/mass, mouth sores, nosebleeds, sore throat and trouble swallowing.   Eyes:  Negative for  eye problems.  Respiratory:  Positive for cough and shortness of breath.   Cardiovascular:  Positive for chest pain. Negative for leg swelling and palpitations.  Gastrointestinal:  Negative for abdominal pain, constipation, diarrhea, nausea and vomiting.  Genitourinary:  Negative for bladder incontinence, difficulty urinating, dysuria, frequency, hematuria and nocturia.   Musculoskeletal:  Negative for arthralgias, back pain, flank pain, myalgias and neck pain.  Skin:  Negative for itching and rash.  Neurological:  Positive for dizziness, headaches and numbness.  Hematological:  Does not bruise/bleed easily.  Psychiatric/Behavioral:  Negative for depression, sleep  disturbance and suicidal ideas. The patient is not nervous/anxious.   All other systems reviewed and are negative.    VITALS:   Blood pressure 129/69, pulse 88, temperature 98.4 F (36.9 C), temperature source Oral, resp. rate 16, weight 183 lb 4.8 oz (83.1 kg), SpO2 97 %.  Wt Readings from Last 3 Encounters:  11/21/22 183 lb 4.8 oz (83.1 kg)  11/09/22 181 lb (82.1 kg)  11/07/22 183 lb 4.8 oz (83.1 kg)    Body mass index is 34.63 kg/m.  Performance status (ECOG): 1 - Symptomatic but completely ambulatory  PHYSICAL EXAM:   Physical Exam Vitals and nursing note reviewed. Exam conducted with a chaperone present.  Constitutional:      Appearance: Normal appearance.  Cardiovascular:     Rate and Rhythm: Normal rate and regular rhythm.     Pulses: Normal pulses.     Heart sounds: Normal heart sounds.  Pulmonary:     Effort: Pulmonary effort is normal.     Breath sounds: Normal breath sounds.  Abdominal:     Palpations: Abdomen is soft. There is no hepatomegaly, splenomegaly or mass.     Tenderness: There is no abdominal tenderness.  Musculoskeletal:     Right lower leg: No edema.     Left lower leg: No edema.  Lymphadenopathy:     Cervical: No cervical adenopathy.     Right cervical: No superficial, deep or posterior cervical adenopathy.    Left cervical: No superficial, deep or posterior cervical adenopathy.     Upper Body:     Right upper body: No supraclavicular or axillary adenopathy.     Left upper body: No supraclavicular or axillary adenopathy.  Neurological:     General: No focal deficit present.     Mental Status: She is alert and oriented to person, place, and time.  Psychiatric:        Mood and Affect: Mood normal.        Behavior: Behavior normal.     LABS:      Latest Ref Rng & Units 09/12/2022   11:20 AM 08/15/2022   10:45 AM 07/04/2022    8:02 AM  CBC  WBC 4.0 - 10.5 K/uL 7.6  8.7  8.4   Hemoglobin 12.0 - 15.0 g/dL 16.1  09.6  04.5   Hematocrit  36.0 - 46.0 % 34.6  32.2  36.4   Platelets 150 - 400 K/uL 269  273  273       Latest Ref Rng & Units 09/12/2022   11:20 AM 08/15/2022   10:45 AM 07/04/2022    8:02 AM  CMP  Glucose 70 - 99 mg/dL 96  409  811   BUN 6 - 20 mg/dL 46  64  27   Creatinine 0.44 - 1.00 mg/dL 91.47  82.95  6.21   Sodium 135 - 145 mmol/L 138  139  139   Potassium 3.5 -  5.1 mmol/L 3.8  3.6  3.4   Chloride 98 - 111 mmol/L 96  97  97   CO2 22 - 32 mmol/L Calcium 8.9 - 10.3 mg/dL 9.8  9.4  9.7   Total Protein 6.5 - 8.1 g/dL 7.3   8.3   Total Bilirubin 0.3 - 1.2 mg/dL 0.7   0.8   Alkaline Phos 38 - 126 U/L 90   94   AST 15 - 41 U/L 26   19   ALT 0 - 44 U/L 27   22      No results found for: "CEA1", "CEA" / No results found for: "CEA1", "CEA" No results found for: "PSA1" No results found for: "ZOX096" No results found for: "CAN125"  No results found for: "TOTALPROTELP", "ALBUMINELP", "A1GS", "A2GS", "BETS", "BETA2SER", "GAMS", "MSPIKE", "SPEI" Lab Results  Component Value Date   TIBC 195 (L) 02/15/2021   TIBC 190 (L) 12/10/2018   TIBC 218 (L) 06/08/2016   FERRITIN 1,742 (H) 02/15/2021   FERRITIN 1,308 (H) 12/10/2018   FERRITIN 126 06/08/2016   IRONPCTSAT 15 02/15/2021   IRONPCTSAT 43 (H) 12/10/2018   IRONPCTSAT 22 06/08/2016   No results found for: "LDH"   STUDIES:   [place] LIMITED ULTRASOUND INCLUDING AXILLA RIGHT BREAST  Result Date: 11/14/2022 CLINICAL DATA:  62 year old female with history right breast cancer in 2020 and 2021 post bilateral mastectomies. Patient reports a palpable area of concern in the right axilla. EXAM: ULTRASOUND OF THE RIGHT BREAST COMPARISON:  None available. FINDINGS: Physical examination at site of palpable concern in the region of the right axilla reveals thickening felt to be related to the musculature. Targeted ultrasound of the upper outer right chest wall and right axilla was performed. No suspicious masses or any other abnormality seen, only normal-appearing  fibrofatty tissue identified. IMPRESSION: No sonographic abnormalities in the region palpable concern in the right axilla/upper outer right chest wall. RECOMMENDATION: Recommend further management of the right axilla palpable area of concern be based on clinical assessment. I have discussed the findings and recommendations with the patient. If applicable, a reminder letter will be sent to the patient regarding the next appointment. BI-RADS CATEGORY  1: Negative. Electronically Signed   By: Edwin Cap M.D.   On: 11/14/2022 12:09

## 2022-11-21 ENCOUNTER — Inpatient Hospital Stay: Payer: Medicare Other | Attending: Hematology | Admitting: Hematology

## 2022-11-21 VITALS — BP 129/69 | HR 88 | Temp 98.4°F | Resp 16 | Wt 183.3 lb

## 2022-11-21 DIAGNOSIS — Z923 Personal history of irradiation: Secondary | ICD-10-CM | POA: Insufficient documentation

## 2022-11-21 DIAGNOSIS — E1122 Type 2 diabetes mellitus with diabetic chronic kidney disease: Secondary | ICD-10-CM | POA: Insufficient documentation

## 2022-11-21 DIAGNOSIS — Z170 Estrogen receptor positive status [ER+]: Secondary | ICD-10-CM | POA: Diagnosis not present

## 2022-11-21 DIAGNOSIS — Z79811 Long term (current) use of aromatase inhibitors: Secondary | ICD-10-CM | POA: Diagnosis not present

## 2022-11-21 DIAGNOSIS — E559 Vitamin D deficiency, unspecified: Secondary | ICD-10-CM | POA: Insufficient documentation

## 2022-11-21 DIAGNOSIS — I132 Hypertensive heart and chronic kidney disease with heart failure and with stage 5 chronic kidney disease, or end stage renal disease: Secondary | ICD-10-CM | POA: Insufficient documentation

## 2022-11-21 DIAGNOSIS — Z79899 Other long term (current) drug therapy: Secondary | ICD-10-CM | POA: Diagnosis not present

## 2022-11-21 DIAGNOSIS — I5032 Chronic diastolic (congestive) heart failure: Secondary | ICD-10-CM | POA: Insufficient documentation

## 2022-11-21 DIAGNOSIS — M79621 Pain in right upper arm: Secondary | ICD-10-CM

## 2022-11-21 DIAGNOSIS — Z992 Dependence on renal dialysis: Secondary | ICD-10-CM | POA: Diagnosis not present

## 2022-11-21 DIAGNOSIS — N186 End stage renal disease: Secondary | ICD-10-CM | POA: Insufficient documentation

## 2022-11-21 DIAGNOSIS — C50211 Malignant neoplasm of upper-inner quadrant of right female breast: Secondary | ICD-10-CM | POA: Diagnosis not present

## 2022-11-21 DIAGNOSIS — Z7982 Long term (current) use of aspirin: Secondary | ICD-10-CM | POA: Diagnosis not present

## 2022-11-21 DIAGNOSIS — M8580 Other specified disorders of bone density and structure, unspecified site: Secondary | ICD-10-CM | POA: Diagnosis not present

## 2022-11-21 DIAGNOSIS — Z9013 Acquired absence of bilateral breasts and nipples: Secondary | ICD-10-CM | POA: Insufficient documentation

## 2022-11-21 DIAGNOSIS — G629 Polyneuropathy, unspecified: Secondary | ICD-10-CM | POA: Insufficient documentation

## 2022-11-21 NOTE — Patient Instructions (Addendum)
[facility] at [facility] Discharge Instructions   You were seen and examined today by Dr. Ellin Saba.  He reviewed the results of your ultrasound which is normal. It shows there is only scar tissue there.   Dr. Kirtland Bouchard does not have a good explanation as to why you are having this pain under your right arm. We will plan for you to have a PET scan to investigate this further.   Return as scheduled after PET.      Thank you for choosing [facility] at [facility] to provide your oncology and hematology care.  To afford each patient quality time with our provider, please arrive at least 15 minutes before your scheduled appointment time.   If you have a lab appointment with the Cancer Center please come in thru the Main Entrance and check in at the main information desk.  You need to re-schedule your appointment should you arrive 10 or more minutes late.  We strive to give you quality time with our providers, and arriving late affects you and other patients whose appointments are after yours.  Also, if you no show three or more times for appointments you may be dismissed from the clinic at the providers discretion.     Again, thank you for choosing [facility].  Our hope is that these requests will decrease the amount of time that you wait before being seen by our physicians.       _____________________________________________________________  Should you have questions after your visit to [facility], please contact our office at [phone] and follow the prompts.  Our office hours are 8:00 a.m. and 4:30 p.m. Monday - Friday.  Please note that voicemails left after 4:00 p.m. may not be returned until the following business day.  We are closed weekends and major holidays.  You do have access to a nurse 24-7, just call the main number to the clinic [phone] and do not press any options, hold on the line and a nurse will answer  the phone.    For prescription refill requests, have your pharmacy contact our office and allow 72 hours.    Due to Covid, you will need to wear a mask upon entering the hospital. If you do not have a mask, a mask will be given to you at the Main Entrance upon arrival. For doctor visits, patients may have 1 support person age 39 or older with them. For treatment visits, patients can not have anyone with them due to social distancing guidelines and our immunocompromised population.

## 2022-11-22 DIAGNOSIS — I159 Secondary hypertension, unspecified: Secondary | ICD-10-CM | POA: Diagnosis not present

## 2022-11-22 DIAGNOSIS — N2581 Secondary hyperparathyroidism of renal origin: Secondary | ICD-10-CM | POA: Diagnosis not present

## 2022-11-22 DIAGNOSIS — E1129 Type 2 diabetes mellitus with other diabetic kidney complication: Secondary | ICD-10-CM | POA: Diagnosis not present

## 2022-11-22 DIAGNOSIS — Z992 Dependence on renal dialysis: Secondary | ICD-10-CM | POA: Diagnosis not present

## 2022-11-22 DIAGNOSIS — N186 End stage renal disease: Secondary | ICD-10-CM | POA: Diagnosis not present

## 2022-11-22 DIAGNOSIS — D631 Anemia in chronic kidney disease: Secondary | ICD-10-CM | POA: Diagnosis not present

## 2022-11-24 DIAGNOSIS — N186 End stage renal disease: Secondary | ICD-10-CM | POA: Diagnosis not present

## 2022-11-24 DIAGNOSIS — E1129 Type 2 diabetes mellitus with other diabetic kidney complication: Secondary | ICD-10-CM | POA: Diagnosis not present

## 2022-11-24 DIAGNOSIS — D631 Anemia in chronic kidney disease: Secondary | ICD-10-CM | POA: Diagnosis not present

## 2022-11-24 DIAGNOSIS — N2581 Secondary hyperparathyroidism of renal origin: Secondary | ICD-10-CM | POA: Diagnosis not present

## 2022-11-24 DIAGNOSIS — Z992 Dependence on renal dialysis: Secondary | ICD-10-CM | POA: Diagnosis not present

## 2022-11-24 DIAGNOSIS — I159 Secondary hypertension, unspecified: Secondary | ICD-10-CM | POA: Diagnosis not present

## 2022-11-27 DIAGNOSIS — E1129 Type 2 diabetes mellitus with other diabetic kidney complication: Secondary | ICD-10-CM | POA: Diagnosis not present

## 2022-11-27 DIAGNOSIS — N186 End stage renal disease: Secondary | ICD-10-CM | POA: Diagnosis not present

## 2022-11-27 DIAGNOSIS — Z992 Dependence on renal dialysis: Secondary | ICD-10-CM | POA: Diagnosis not present

## 2022-11-27 DIAGNOSIS — N2581 Secondary hyperparathyroidism of renal origin: Secondary | ICD-10-CM | POA: Diagnosis not present

## 2022-11-27 DIAGNOSIS — D631 Anemia in chronic kidney disease: Secondary | ICD-10-CM | POA: Diagnosis not present

## 2022-11-27 DIAGNOSIS — I159 Secondary hypertension, unspecified: Secondary | ICD-10-CM | POA: Diagnosis not present

## 2022-11-29 DIAGNOSIS — D631 Anemia in chronic kidney disease: Secondary | ICD-10-CM | POA: Diagnosis not present

## 2022-11-29 DIAGNOSIS — N2581 Secondary hyperparathyroidism of renal origin: Secondary | ICD-10-CM | POA: Diagnosis not present

## 2022-11-29 DIAGNOSIS — I159 Secondary hypertension, unspecified: Secondary | ICD-10-CM | POA: Diagnosis not present

## 2022-11-29 DIAGNOSIS — Z992 Dependence on renal dialysis: Secondary | ICD-10-CM | POA: Diagnosis not present

## 2022-11-29 DIAGNOSIS — E1129 Type 2 diabetes mellitus with other diabetic kidney complication: Secondary | ICD-10-CM | POA: Diagnosis not present

## 2022-11-29 DIAGNOSIS — N186 End stage renal disease: Secondary | ICD-10-CM | POA: Diagnosis not present

## 2022-11-30 ENCOUNTER — Ambulatory Visit (HOSPITAL_COMMUNITY)
Admission: RE | Admit: 2022-11-30 | Discharge: 2022-11-30 | Disposition: A | Discharge status: Home / Self Care | Payer: Medicare Other | Source: Ambulatory Visit | Attending: Hematology | Admitting: Hematology

## 2022-11-30 DIAGNOSIS — M79621 Pain in right upper arm: Secondary | ICD-10-CM | POA: Insufficient documentation

## 2022-11-30 DIAGNOSIS — C50211 Malignant neoplasm of upper-inner quadrant of right female breast: Secondary | ICD-10-CM | POA: Insufficient documentation

## 2022-11-30 DIAGNOSIS — C50911 Malignant neoplasm of unspecified site of right female breast: Secondary | ICD-10-CM | POA: Diagnosis not present

## 2022-11-30 DIAGNOSIS — Z170 Estrogen receptor positive status [ER+]: Secondary | ICD-10-CM | POA: Diagnosis not present

## 2022-11-30 MED ORDER — FLUDEOXYGLUCOSE F - 18 (FDG) INJECTION
9.1300 | Freq: Once | INTRAVENOUS | Status: AC | PRN
  Administered 2022-11-30: 9.13 via INTRAVENOUS

## 2022-12-01 DIAGNOSIS — N2581 Secondary hyperparathyroidism of renal origin: Secondary | ICD-10-CM | POA: Diagnosis not present

## 2022-12-01 DIAGNOSIS — I159 Secondary hypertension, unspecified: Secondary | ICD-10-CM | POA: Diagnosis not present

## 2022-12-01 DIAGNOSIS — D631 Anemia in chronic kidney disease: Secondary | ICD-10-CM | POA: Diagnosis not present

## 2022-12-01 DIAGNOSIS — Z992 Dependence on renal dialysis: Secondary | ICD-10-CM | POA: Diagnosis not present

## 2022-12-01 DIAGNOSIS — N186 End stage renal disease: Secondary | ICD-10-CM | POA: Diagnosis not present

## 2022-12-01 DIAGNOSIS — E1129 Type 2 diabetes mellitus with other diabetic kidney complication: Secondary | ICD-10-CM | POA: Diagnosis not present

## 2022-12-04 DIAGNOSIS — I159 Secondary hypertension, unspecified: Secondary | ICD-10-CM | POA: Diagnosis not present

## 2022-12-04 DIAGNOSIS — N186 End stage renal disease: Secondary | ICD-10-CM | POA: Diagnosis not present

## 2022-12-04 DIAGNOSIS — N2581 Secondary hyperparathyroidism of renal origin: Secondary | ICD-10-CM | POA: Diagnosis not present

## 2022-12-04 DIAGNOSIS — Z992 Dependence on renal dialysis: Secondary | ICD-10-CM | POA: Diagnosis not present

## 2022-12-04 DIAGNOSIS — D631 Anemia in chronic kidney disease: Secondary | ICD-10-CM | POA: Diagnosis not present

## 2022-12-04 DIAGNOSIS — E1129 Type 2 diabetes mellitus with other diabetic kidney complication: Secondary | ICD-10-CM | POA: Diagnosis not present

## 2022-12-04 NOTE — Progress Notes (Signed)
[facility] [address], [place] [address]    Clinic Day:  12/05/2022  Referring physician: Donita Brooks, MD  Patient Care Team: Donita Brooks, MD as PCP - General (Family Medicine) [name] [name], MD as PCP - Cardiology (Cardiology) [name], MD (Gastroenterology) [name], MD as Consulting Physician (Cardiology) [name], MD as Medical Oncologist (Medical Oncology) [name], MD as Consulting Physician (Nephrology)   ASSESSMENT & PLAN:   Assessment: 1.  Stage Ib (T1CN0) right breast cancer, ER positive, PR and HER-2 negative: -Right lumpectomy and sentinel lymph node biopsy on 09/18/2018 shows 1.5 cm IDC, grade 3, associated with high-grade DCIS, negative margins, 0/3 lymph nodes positive, ER 50%, PR negative and HER-2 negative, Ki-67 60%. -Oncotype DX recurrence score 47.  Distant recurrence at 9 years with tamoxifen alone is 36%.  Absolute chemotherapy benefit is more than 15%. -Adjuvant chemotherapy with 4 cycles of AC from 10/30/2018 through 01/01/2019. -She was started on anastrozole in June 2020. -Mammogram on 09/02/2019 showed calcifications in the posterior aspect of upper outer quadrant of the right breast. -Right partial mastectomy on 02/18/2020 shows high-grade DCIS, 1.5 cm, no invasive carcinoma, resection margins negative.  0/11 lymph nodes.  ER 30% positive, PR negative. -Left simple mastectomy on 06/15/2020 was benign.  Right breast biopsy shows microscopic focus of invasive ductal carcinoma in the background of extensive high-grade DCIS. -Right breast lumpectomy on 07/13/2020 shows multifocal invasive ductal carcinoma, grade 3, largest measuring 1.2 cm.  Extensive high-grade DCIS with necrosis.  Margins are free.  PT1CNX.  There are multiple foci of invasive carcinoma arising from extensive DCIS.  Several small foci of invasive tumor arising from DCIS indicating new primaries.  ER/PR/HER-2 is negative.   Ki-67 is 20%. -PET scan on 09/14/2020 shows areas of nodularity with discrete nodule in the fat of the inferior chest wall within the subcutaneous tissues. -Ultrasound of the right chest wall shows masslike abnormality in the upper inner aspect of the right anterior chest at 1 o'clock position measuring 4.1 cm in greatest dimension.  1.7 cm hypoechoic mass in the central anterior right chest concerning for malignancy.  Possible borderline enlarged residual right axillary lymph node. -Right breast biopsy on 10/12/2020 shows 1.3 cm invasive ductal carcinoma, focally 0.1 cm from superior margin.  DCIS is less than 0.1 cm from superior margin.  PT1CPNX. - Right mastectomy followed by radiation therapy completed in May 2022.   2.  Osteopenia: -Bone density on 01/20/2019 shows T score -1.9. - DEXA scan (03/24/2021) T score -2.4    Plan: 1.  Recurrent right breast cancer, triple receptor negative: - She is tolerating anastrozole very well. - She complained of pain in the right axillary and right anterior chest wall region. - We reviewed PET scan images from 11/30/2022.  This showed recurrent nodule of hypermetabolic in the medial right pectoralis musculature corresponding to subtle soft tissue fullness on the CT component.  SUV of 13.4.  No other abnormal findings. - I have reached out to Dr. Langston Masker and discussed the case with him. - We will go ahead and arrange for biopsy of the suspicious area by IR. - We will also reach out to Dr. Lovell Sheehan to see if this is resectable.   2.  Osteopenia/vitamin D deficiency: - Continue Nephro-Vite and vitamin D.   3.  ESRD on HD: - Continue hemodialysis on Monday, Wednesday and Friday under the direction of Dr. Arrie Aran.   4.  Neuropathy: -  Continue gabapentin at bedtime.  Well-controlled.    No orders of the defined types were placed in this encounter.     I,Katie Daubenspeck,acting as a Neurosurgeon for [name], MD.,have documented all relevant  documentation on the behalf of [name], MD,as directed by  [name], MD while in the presence of [name], MD.   I, [name] MD, have reviewed the above documentation for accuracy and completeness, and I agree with the above.   [name], MD   4/23/20245:22 PM  CHIEF COMPLAINT:   Diagnosis: recurrent right breast cancer    Cancer Staging  Malignant neoplasm of upper-inner quadrant of right female breast Staging form: Breast, AJCC 8th Edition - Clinical stage from 09/26/2018: Stage IB (cT1c, cN0, cM0, G3, ER+, PR-, HER2-) - Signed by [name], MD on 09/26/2018    Prior Therapy: 1. Right lumpectomy and sentinel lymph node biopsy on 09/18/2018  2. Adjuvant chemotherapy with 4 cycles of AC from 10/30/2018 through 01/01/2019  3. Right partial mastectomy on 02/18/2020  4. Right breast lumpectomy on 07/13/2020  5. Right mastectomy followed by radiation therapy completed in May 2022   Current Therapy:  anastrozole started June 2020    HISTORY OF PRESENT ILLNESS:   Oncology History  Malignant neoplasm of upper-inner quadrant of right female breast  09/18/2018 Initial Diagnosis   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)   09/26/2018 Cancer Staging   Staging form: Breast, AJCC 8th Edition - Clinical stage from 09/26/2018: Stage IB (cT1c, cN0, cM0, G3, ER+, PR-, HER2-) - Signed by [name], MD on 09/26/2018   10/30/2018 - 01/01/2019 Chemotherapy   Patient is on Treatment Plan : BREAST Adjuvant AC q21d        INTERVAL HISTORY:   Megan [name] is a 62 y.o. female presenting to clinic today for follow up of recurrent right breast cancer. She was last seen by me on 11/21/22.  Since her last visit, she underwent restaging PET scan on 11/30/22 showing: recurrent/metastatic disease within right medial pectoralis musculature.  Today, she states that she is doing well overall. Her appetite level is at  75%. Her energy level is at 100%.  PAST MEDICAL HISTORY:   Past Medical History: Past Medical History:  Diagnosis Date   (HFpEF) heart failure with preserved ejection fraction    a. 01/2019 Echo: EF 55-60%, mild conc LVH. DD.  Torn MV chordae.   Anemia    Atypical chest pain    a. 08/2018 MV: EF 59%, no ischemia; b. 02/2019 Cath: nonobs dzs.   Blood transfusion without reported diagnosis    Breast cancer 10/12/2020   Cataract    ESRD (end stage renal disease) on dialysis    a. HD T, T, S   Essential hypertension, benign    GERD (gastroesophageal reflux disease)    Headache    Hemorrhoids    Mixed hyperlipidemia    Morbid obesity    Non-obstructive CAD (coronary artery disease)    a. 02/2019 CathL LM nl, LAD 10m, LCX nl, RCA 25p, EF 55-65%.   PONV (postoperative nausea and vomiting)    S/P colonoscopy Jan 2011   Dr. Elnoria Howard: sessile polyp (benign lymphoid), large hemorrhoids, repeat 5-10 years   Temporal arteritis    Type 2 diabetes mellitus    Wears glasses     Surgical History: Past Surgical History:  Procedure Laterality Date   ABDOMINAL HYSTERECTOMY     APPENDECTOMY     ARTERY BIOPSY N/A 05/09/2018  Procedure: RIGHT TEMPORAL ARTERY BIOPSY;  Surgeon: Jimmye Norman, MD;  Location: [facility] OR;  Service: General;  Laterality: N/A;   BREAST BIOPSY Right 06/15/2020   Procedure: RIGHT BREAST BIOPSY;  Surgeon: Franky Macho, MD;  Location: [facility] ORS;  Service: General;  Laterality: Right;   CATARACT EXTRACTION W/PHACO Left 02/09/2017   Procedure: CATARACT EXTRACTION PHACO AND INTRAOCULAR LENS PLACEMENT LEFT EYE;  Surgeon: Gemma Payor, MD;  Location: [facility];  Service: Ophthalmology;  Laterality: Left;  CDE: 4.89   CATARACT EXTRACTION W/PHACO Right 06/04/2017   Procedure: CATARACT EXTRACTION PHACO AND INTRAOCULAR LENS PLACEMENT (IOC);  Surgeon: Gemma Payor, MD;  Location: [facility];  Service: Ophthalmology;  Laterality: Right;  CDE: 4.12   CHOLECYSTECTOMY  09/29/2011   Procedure: LAPAROSCOPIC  CHOLECYSTECTOMY;  Surgeon: Dalia Heading, MD;  Location: [facility];  Service: General;  Laterality: N/A;   COLONOSCOPY  08/2009   Dr. Elnoria Howard: sessile polyp (benign lymphoid), large hemorrhoids, repeat 5-10 years   COLONOSCOPY N/A 06/12/2016   prominent hemorrhoids   COLONOSCOPY WITH PROPOFOL N/A 06/16/2021   Procedure: COLONOSCOPY WITH PROPOFOL;  Surgeon: Corbin Ade, MD;  Location: [facility];  Service: Endoscopy;  Laterality: N/A;  9:30am (dialysis pt)   ESOPHAGOGASTRODUODENOSCOPY  09/05/2011   [id number] hiatal hernia; remainder of exam normal. No explanation for patient's abdominal pain with today's examination   ESOPHAGOGASTRODUODENOSCOPY N/A 12/17/2013   Dr. Jena Gauss: gastric erythema, erosion, mild chronic inflammation on path    ESOPHAGOGASTRODUODENOSCOPY (EGD) WITH PROPOFOL N/A 06/16/2021   Procedure: ESOPHAGOGASTRODUODENOSCOPY (EGD) WITH PROPOFOL;  Surgeon: Corbin Ade, MD;  Location: [facility];  Service: Endoscopy;  Laterality: N/A;   EXCISION OF BREAST BIOPSY Right 10/12/2020   Procedure: EXCISION OF RIGHT BREAST BIOPSY;  Surgeon: Franky Macho, MD;  Location: [facility];  Service: General;  Laterality: Right;   LAPAROSCOPIC APPENDECTOMY  09/29/2011   Procedure: APPENDECTOMY LAPAROSCOPIC;  Surgeon: Dalia Heading, MD;  Location: [facility];  Service: General;;  incidental appendectomy   LEFT HEART CATH AND CORONARY ANGIOGRAPHY N/A 02/28/2019   Procedure: LEFT HEART CATH AND CORONARY ANGIOGRAPHY;  Surgeon: Corky Crafts, MD;  Location: [facility] INVASIVE CV LAB;  Service: Cardiovascular;  Laterality: N/A;   MASTECTOMY MODIFIED RADICAL Right 02/18/2020   Procedure: MASTECTOMY MODIFIED RADICAL;  Surgeon: Franky Macho, MD;  Location: [facility];  Service: General;  Laterality: Right;   MASTECTOMY, PARTIAL Right 07/13/2020   Procedure: RIGHT PARTIAL MASTECTOMY;  Surgeon: Franky Macho, MD;  Location: [facility];  Service: General;  Laterality: Right;   PARTIAL MASTECTOMY WITH NEEDLE LOCALIZATION  AND AXILLARY SENTINEL LYMPH NODE BX Right 09/18/2018   Procedure: RIGHT PARTIAL MASTECTOMY AFTER NEEDLE LOCALIZATION, SENTINEL LYMPH NODE BIOPSY RIGHT AXILLA;  Surgeon: Franky Macho, MD;  Location: [facility];  Service: General;  Laterality: Right;   POLYPECTOMY  06/16/2021   Procedure: POLYPECTOMY;  Surgeon: Corbin Ade, MD;  [facility]: [facility];  Service: Endoscopy;;   SIMPLE MASTECTOMY WITH AXILLARY SENTINEL NODE BIOPSY Left 06/15/2020   Procedure: LEFT SIMPLE MASTECTOMY;  Surgeon: Franky Macho, MD;  Location: [facility];  Service: General;  Laterality: Left;    Social History: Social History   Socioeconomic History   Marital status: Married    Spouse name: Not on file   Number of children: Not on file   Years of education: Not on file   Highest education level: Not on file  Occupational History   Occupation: Food Corporate treasurer: [id number]  Tobacco Use  Smoking status: Never    Passive exposure: Never   Smokeless tobacco: Never  Vaping Use   Vaping Use: Never used  Substance and Sexual Activity   Alcohol use: No   Drug use: No   Sexual activity: Yes    Birth control/protection: Surgical  Other Topics Concern   Not on file  Social History Narrative   Works at [employer] in [place].    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   Also has to get items from high shelves-causes achy pain in shoulder area      Married.   Children are grown, out of house.   Social Determinants of Health   Financial Resource Strain: Low Risk  (10/10/2022)   Overall Financial Resource Strain (CARDIA)    Difficulty of Paying Living Expenses: Not hard at all  Food Insecurity: No Food Insecurity (10/10/2022)   Hunger Vital Sign    Worried About Running Out of Food in the Last Year: Never true    Ran Out of Food in the Last Year: Never true  Transportation Needs: No Transportation Needs (09/14/2022)   PRAPARE - Administrator, Civil Service (Medical): No    Lack  of Transportation (Non-Medical): No  Physical Activity: Inactive (09/14/2022)   Exercise Vital Sign    Days of Exercise per Week: 0 days    Minutes of Exercise per Session: 0 min  Stress: No Stress Concern Present (09/14/2022)   [employer] of Occupational Health - Occupational Stress Questionnaire    Feeling of Stress : Not at all  Social Connections: Socially Integrated (09/14/2022)   Social Connection and Isolation Panel [NHANES]    Frequency of Communication with Friends and Family: More than three times a week    Frequency of Social Gatherings with Friends and Family: Three times a week    Attends Religious Services: More than 4 times per year    Active Member of Clubs or Organizations: Yes    Attends Banker Meetings: More than 4 times per year    Marital Status: Married  Catering manager Violence: Not At Risk (09/14/2022)   Humiliation, Afraid, Rape, and Kick questionnaire    Fear of Current or Ex-Partner: No    Emotionally Abused: No    Physically Abused: No    Sexually Abused: No    Family History: Family History  Problem Relation Age of Onset   Hypertension Mother    Coronary artery disease Mother    Diabetes Mother    Hypertension Sister    Coronary artery disease Sister    Hypertension Brother    Heart attack Father    Hypertension Son    Heart attack Maternal Aunt    Hypertension Maternal Aunt    Diabetes Maternal Aunt    Heart attack Maternal Uncle    Hypertension Maternal Uncle    Diabetes Maternal Uncle    Heart attack Paternal Aunt    Hypertension Paternal Aunt    Diabetes Paternal Aunt    Heart attack Paternal Uncle    Hypertension Paternal Uncle    Diabetes Paternal Uncle    Heart attack Maternal Grandmother    Heart attack Maternal Grandfather    Heart attack Paternal Grandmother    Heart attack Paternal Grandfather    Colon cancer Neg Hx     Current Medications:  Current Outpatient Medications:    amLODipine (NORVASC) 10 MG  tablet, TAKE 1 TABLET BY MOUTH AT BEDTIME, Disp: 90  tablet, Rfl: 3   anastrozole (ARIMIDEX) 1 MG tablet, TAKE 1 TABLET(1 MG) BY MOUTH DAILY, Disp: 30 tablet, Rfl: 6   aspirin EC 81 MG tablet, Take 81 mg by mouth daily., Disp: , Rfl:    atorvastatin (LIPITOR) 20 MG tablet, Take 1 tablet (20 mg total) by mouth daily. TAKE 1 TABLET DAILY, Disp: 90 tablet, Rfl: 3   B Complex-C-Zn-Folic Acid (DIALYVITE 800-ZINC 15) 0.8 MG TABS, Take 1 tablet by mouth daily., Disp: , Rfl:    cinacalcet (SENSIPAR) 30 MG tablet, Take 2 tablets (60 mg total) by mouth daily. (Patient taking differently: Take 30 mg by mouth daily.), Disp: 90 tablet, Rfl: 0   Darbepoetin Alfa (ARANESP, ALBUMIN FREE, IJ), Darbepoetin Alfa (Aranesp), Disp: , Rfl:    fluticasone (FLONASE) 50 MCG/ACT nasal spray, Place 2 sprays into both nostrils daily., Disp: 16 g, Rfl: 6   gabapentin (NEURONTIN) 300 MG capsule, TAKE 1 CAPSULE(300 MG) BY MOUTH AT BEDTIME, Disp: 90 capsule, Rfl: 1   isosorbide mononitrate (IMDUR) 30 MG 24 hr tablet, Take 1.5 tablets (45 mg total) by mouth daily., Disp: 135 tablet, Rfl: 3   lanthanum (FOSRENOL) 1000 MG chewable tablet, Chew 2,000-3,000 mg by mouth See admin instructions. Take 3 tablets (3000 mg) by mouth with meals and take 2 tablets (2000 mg) with snacks, Disp: , Rfl:    lidocaine-prilocaine (EMLA) cream, Apply 1 application  topically every Monday, Wednesday, and Friday with hemodialysis., Disp: , Rfl:    meclizine (ANTIVERT) 25 MG tablet, TAKE 1 TABLET(25 MG) BY MOUTH THREE TIMES DAILY AS NEEDED FOR DIZZINESS, Disp: 30 tablet, Rfl: 1   nebivolol (BYSTOLIC) 10 MG tablet, Take 1 tablet (10 mg total) by mouth in the morning and at bedtime., Disp: 180 tablet, Rfl: 3   nitroGLYCERIN (NITROSTAT) 0.4 MG SL tablet, Place 1 tablet (0.4 mg total) under the tongue every 5 (five) minutes x 3 doses as needed for chest pain (if no relief after 3rd dose, proceed to ED or call 911)., Disp: 25 tablet, Rfl: 3   ondansetron (ZOFRAN)  4 MG tablet, Take 1 tablet (4 mg total) by mouth every 8 (eight) hours as needed for nausea or vomiting., Disp: 10 tablet, Rfl: 0   oxyCODONE-acetaminophen (PERCOCET/ROXICET) 5-325 MG tablet, , Disp: , Rfl:    pantoprazole (PROTONIX) 40 MG tablet, TAKE 1 TABLET(40 MG) BY MOUTH DAILY, Disp: 90 tablet, Rfl: 0   sucralfate (CARAFATE) 1 GM/10ML suspension, Take 10 mLs (1 g total) by mouth with breakfast, with lunch, and with evening meal., Disp: 420 mL, Rfl: 2   Allergies: No Known Allergies  REVIEW OF SYSTEMS:   Review of Systems  Constitutional:  Negative for chills, fatigue and fever.  HENT:   Negative for lump/mass, mouth sores, nosebleeds, sore throat and trouble swallowing.   Eyes:  Negative for eye problems.  Respiratory:  Negative for cough and shortness of breath.   Cardiovascular:  Negative for chest pain, leg swelling and palpitations.  Gastrointestinal:  Negative for abdominal pain, constipation, diarrhea, nausea and vomiting.  Genitourinary:  Negative for bladder incontinence, difficulty urinating, dysuria, frequency, hematuria and nocturia.   Musculoskeletal:  Negative for arthralgias, back pain, flank pain, myalgias and neck pain.  Skin:  Negative for itching and rash.  Neurological:  Positive for dizziness, headaches and numbness.  Hematological:  Does not bruise/bleed easily.  Psychiatric/Behavioral:  Positive for sleep disturbance. Negative for depression and suicidal ideas. The patient is not nervous/anxious.   All other systems reviewed and are negative.  VITALS:   Blood pressure (!) 149/80, pulse 72, temperature 97.7 F (36.5 C), temperature source Oral, resp. rate 18, weight 184 lb (83.5 kg), SpO2 99 %.  Wt Readings from Last 3 Encounters:  12/05/22 184 lb (83.5 kg)  11/21/22 183 lb 4.8 oz (83.1 kg)  11/09/22 181 lb (82.1 kg)    Body mass index is 34.77 kg/m.  Performance status (ECOG): 1 - Symptomatic but completely ambulatory  PHYSICAL EXAM:   Physical  Exam Vitals and nursing note reviewed. Exam conducted with a chaperone present.  Constitutional:      Appearance: Normal appearance.  Cardiovascular:     Rate and Rhythm: Normal rate and regular rhythm.     Pulses: Normal pulses.     Heart sounds: Normal heart sounds.  Pulmonary:     Effort: Pulmonary effort is normal.     Breath sounds: Normal breath sounds.  Abdominal:     Palpations: Abdomen is soft. There is no hepatomegaly, splenomegaly or mass.     Tenderness: There is no abdominal tenderness.  Musculoskeletal:     Right lower leg: No edema.     Left lower leg: No edema.  Lymphadenopathy:     Cervical: No cervical adenopathy.     Right cervical: No superficial, deep or posterior cervical adenopathy.    Left cervical: No superficial, deep or posterior cervical adenopathy.     Upper Body:     Right upper body: No supraclavicular or axillary adenopathy.     Left upper body: No supraclavicular or axillary adenopathy.  Neurological:     General: No focal deficit present.     Mental Status: She is alert and oriented to person, place, and time.  Psychiatric:        Mood and Affect: Mood normal.        Behavior: Behavior normal.     LABS:      Latest Ref Rng & Units 09/12/2022   11:20 AM 08/15/2022   10:45 AM 07/04/2022    8:02 AM  CBC  WBC 4.0 - 10.5 K/uL 7.6  8.7  8.4   Hemoglobin 12.0 - 15.0 g/dL 54.0  98.1  19.1   Hematocrit 36.0 - 46.0 % 34.6  32.2  36.4   Platelets 150 - 400 K/uL 269  273  273       Latest Ref Rng & Units 09/12/2022   11:20 AM 08/15/2022   10:45 AM 07/04/2022    8:02 AM  CMP  Glucose 70 - 99 mg/dL 96  478  295   BUN 6 - 20 mg/dL 46  64  27   Creatinine 0.44 - 1.00 mg/dL 62.13  08.65  7.84   Sodium 135 - 145 mmol/L 138  139  139   Potassium 3.5 - 5.1 mmol/L 3.8  3.6  3.4   Chloride 98 - 111 mmol/L 96  97  97   CO2 22 - 32 mmol/L 27  26  31    Calcium 8.9 - 10.3 mg/dL 9.8  9.4  9.7   Total Protein 6.5 - 8.1 g/dL 7.3   8.3   Total Bilirubin 0.3  - 1.2 mg/dL 0.7   0.8   Alkaline Phos 38 - 126 U/L 90   94   AST 15 - 41 U/L 26   19   ALT 0 - 44 U/L 27   22      No results found for: "CEA1", "CEA" / No results found for: "CEA1", "CEA" No results found  for: "PSA1" No results found for: "ZOX096" No results found for: "CAN125"  No results found for: "TOTALPROTELP", "ALBUMINELP", "A1GS", "A2GS", "BETS", "BETA2SER", "GAMS", "MSPIKE", "SPEI" Lab Results  Component Value Date   TIBC 195 (L) 02/15/2021   TIBC 190 (L) 12/10/2018   TIBC 218 (L) 06/08/2016   FERRITIN 1,742 (H) 02/15/2021   FERRITIN 1,308 (H) 12/10/2018   FERRITIN 126 06/08/2016   IRONPCTSAT 15 02/15/2021   IRONPCTSAT 43 (H) 12/10/2018   IRONPCTSAT 22 06/08/2016   No results found for: "LDH"   STUDIES:   NM PET Image Restag (PS) Skull Base To Thigh  Result Date: 12/04/2022 CLINICAL DATA:  Subsequent treatment strategy for right-sided breast cancer. Restaging. Bilateral mastectomy 5 years ago. Chemotherapy 4 years ago. Radiation therapy 2 years ago. Right axillary pain. EXAM: NUCLEAR MEDICINE PET SKULL BASE TO THIGH TECHNIQUE: 9.1 mCi F-18 FDG was injected intravenously. Full-ring PET imaging was performed from the skull base to thigh after the radiotracer. CT data was obtained and used for attenuation correction and anatomic localization. Fasting blood glucose: 136 mg/dl COMPARISON:  04/54/0981 abdominopelvic CT. 09/06/2021 bone scan. Most recent PET 09/14/2020 FINDINGS: Mediastinal blood pool activity: SUV max 2.6 Liver activity: SUV max NA NECK: No areas of abnormal hypermetabolism. Incidental CT findings: No cervical adenopathy. Left carotid atherosclerosis. CHEST: No pulmonary parenchymal or thoracic nodal hypermetabolism. Incidental CT findings: Right axillary node dissection. No axillary or subpectoral adenopathy. Tiny hiatal hernia. Mild cardiomegaly. Aortic and coronary artery calcification. Right apical radiation change. Vague right lower lobe 5 mm nodule and 97/3 is  similar to the prior, favoring a benign etiology. Tiny left apical pulmonary nodules x2 including on 62 and 65 of series 3 are unchanged and presumed benign. ABDOMEN/PELVIS: No abdominopelvic parenchymal or nodal hypermetabolism. Incidental CT findings: Cholecystectomy. Normal left adrenal gland. An 8 mm right suprarenal nodule is favored to arise from the adrenal gland and is not tracer avid on 136/3. Not readily apparent on the prior. Moderate bilateral renal cortical thinning. A right renal mildly hypoattenuating 7 mm lesion is similar to on the prior, favoring a hemorrhagic/proteinaceous cyst. Bilateral renal collecting system calculi. Abdominal aortic atherosclerosis. Apparent colonic wall thickening involving the ascending and transverse segments, favored to be due to underdistention. Bilateral internal iliac stents.  Hysterectomy. SKELETON: Status post bilateral mastectomy. About the medial right pectoralis musculature is hypermetabolism corresponding to subtle soft tissue fullness. Example at a S.U.V. max of 13.497/3. No abnormal marrow activity. Incidental CT findings: No acute osseous abnormality. IMPRESSION: 1. Recurrent/metastatic disease within the right medial pectoralis musculature in this patient who is status post bilateral mastectomy. 2. A right suprarenal 8 mm nodule is likely arising from the adrenal gland, not readily apparent on the prior, but not tracer avid. Most likely an incidental benign lesion. Recommend attention on follow-up. 3. Incidental findings, including: Coronary artery atherosclerosis. Aortic Atherosclerosis (ICD10-I70.0). Moderate bilateral renal atrophy. Nephrolithiasis. Electronically Signed   By: Jeronimo Greaves M.D.   On: 12/04/2022 10:54   [place] LIMITED ULTRASOUND INCLUDING AXILLA RIGHT BREAST  Result Date: 11/14/2022 CLINICAL DATA:  62 year old female with history right breast cancer in 2020 and 2021 post bilateral mastectomies. Patient reports a palpable area of concern in  the right axilla. EXAM: ULTRASOUND OF THE RIGHT BREAST COMPARISON:  None available. FINDINGS: Physical examination at site of palpable concern in the region of the right axilla reveals thickening felt to be related to the musculature. Targeted ultrasound of the upper outer right chest wall and right axilla was performed. No  suspicious masses or any other abnormality seen, only normal-appearing fibrofatty tissue identified. IMPRESSION: No sonographic abnormalities in the region palpable concern in the right axilla/upper outer right chest wall. RECOMMENDATION: Recommend further management of the right axilla palpable area of concern be based on clinical assessment. I have discussed the findings and recommendations with the patient. If applicable, a reminder letter will be sent to the patient regarding the next appointment. BI-RADS CATEGORY  1: Negative. Electronically Signed   By: Edwin Cap M.D.   On: 11/14/2022 12:09

## 2022-12-05 ENCOUNTER — Encounter: Payer: Self-pay | Admitting: Hematology

## 2022-12-05 ENCOUNTER — Inpatient Hospital Stay (HOSPITAL_BASED_OUTPATIENT_CLINIC_OR_DEPARTMENT_OTHER): Payer: Medicare Other | Admitting: Hematology

## 2022-12-05 VITALS — BP 149/80 | HR 72 | Temp 97.7°F | Resp 18 | Wt 184.0 lb

## 2022-12-05 DIAGNOSIS — C50911 Malignant neoplasm of unspecified site of right female breast: Secondary | ICD-10-CM

## 2022-12-05 DIAGNOSIS — M8580 Other specified disorders of bone density and structure, unspecified site: Secondary | ICD-10-CM | POA: Diagnosis not present

## 2022-12-05 DIAGNOSIS — E1122 Type 2 diabetes mellitus with diabetic chronic kidney disease: Secondary | ICD-10-CM | POA: Diagnosis not present

## 2022-12-05 DIAGNOSIS — Z170 Estrogen receptor positive status [ER+]: Secondary | ICD-10-CM | POA: Diagnosis not present

## 2022-12-05 DIAGNOSIS — Z79811 Long term (current) use of aromatase inhibitors: Secondary | ICD-10-CM | POA: Diagnosis not present

## 2022-12-05 DIAGNOSIS — C50211 Malignant neoplasm of upper-inner quadrant of right female breast: Secondary | ICD-10-CM | POA: Diagnosis not present

## 2022-12-05 DIAGNOSIS — E559 Vitamin D deficiency, unspecified: Secondary | ICD-10-CM | POA: Diagnosis not present

## 2022-12-05 NOTE — Patient Instructions (Addendum)
[facility] at [facility] Discharge Instructions   You were seen and examined today by Dr. Ellin Saba.  He reviewed the results of the PET scan. It shows there is a cancer spot in your old mastectomy spot in the muscle.   Dr. Kirtland Bouchard will reach out to the surgeon, and to radiation oncology to see if you can receive more radiation to this area.   Return as scheduled.    Thank you for choosing [facility] at [facility] to provide your oncology and hematology care.  To afford each patient quality time with our provider, please arrive at least 15 minutes before your scheduled appointment time.   If you have a lab appointment with the Cancer Center please come in thru the Main Entrance and check in at the main information desk.  You need to re-schedule your appointment should you arrive 10 or more minutes late.  We strive to give you quality time with our providers, and arriving late affects you and other patients whose appointments are after yours.  Also, if you no show three or more times for appointments you may be dismissed from the clinic at the providers discretion.     Again, thank you for choosing [facility].  Our hope is that these requests will decrease the amount of time that you wait before being seen by our physicians.       _____________________________________________________________  Should you have questions after your visit to [facility], please contact our office at [phone] and follow the prompts.  Our office hours are 8:00 a.m. and 4:30 p.m. Monday - Friday.  Please note that voicemails left after 4:00 p.m. may not be returned until the following business day.  We are closed weekends and major holidays.  You do have access to a nurse 24-7, just call the main number to the clinic [phone] and do not press any options, hold on the line and a nurse will answer the phone.    For prescription refill  requests, have your pharmacy contact our office and allow 72 hours.    Due to Covid, you will need to wear a mask upon entering the hospital. If you do not have a mask, a mask will be given to you at the Main Entrance upon arrival. For doctor visits, patients may have 1 support person age 71 or older with them. For treatment visits, patients can not have anyone with them due to social distancing guidelines and our immunocompromised population.

## 2022-12-06 ENCOUNTER — Other Ambulatory Visit: Payer: Self-pay

## 2022-12-06 DIAGNOSIS — I159 Secondary hypertension, unspecified: Secondary | ICD-10-CM | POA: Diagnosis not present

## 2022-12-06 DIAGNOSIS — E1129 Type 2 diabetes mellitus with other diabetic kidney complication: Secondary | ICD-10-CM | POA: Diagnosis not present

## 2022-12-06 DIAGNOSIS — N186 End stage renal disease: Secondary | ICD-10-CM | POA: Diagnosis not present

## 2022-12-06 DIAGNOSIS — Z992 Dependence on renal dialysis: Secondary | ICD-10-CM | POA: Diagnosis not present

## 2022-12-06 DIAGNOSIS — N2581 Secondary hyperparathyroidism of renal origin: Secondary | ICD-10-CM | POA: Diagnosis not present

## 2022-12-06 DIAGNOSIS — C50911 Malignant neoplasm of unspecified site of right female breast: Secondary | ICD-10-CM

## 2022-12-06 DIAGNOSIS — D631 Anemia in chronic kidney disease: Secondary | ICD-10-CM | POA: Diagnosis not present

## 2022-12-06 NOTE — Progress Notes (Signed)
Message from Dr. Ellin Saba- Please arrange for IR biopsy of the right chest wall lesion. CT-guided. Thanks.   CT Biopsy of right chest wall lesion by IR placed per Dr. Marice Potter orders.

## 2022-12-06 NOTE — Progress Notes (Signed)
Gilmer Mor, DO  Leodis Rains D OK for attempt at CT guided biopsy of right chest wall mass.    Would perform this in the CT scan, with [place] in the room.  PET 11/30/22, image  144 of the fused.  [address]

## 2022-12-07 DIAGNOSIS — C7989 Secondary malignant neoplasm of other specified sites: Secondary | ICD-10-CM | POA: Diagnosis not present

## 2022-12-07 DIAGNOSIS — C50911 Malignant neoplasm of unspecified site of right female breast: Secondary | ICD-10-CM | POA: Diagnosis not present

## 2022-12-08 DIAGNOSIS — N2581 Secondary hyperparathyroidism of renal origin: Secondary | ICD-10-CM | POA: Diagnosis not present

## 2022-12-08 DIAGNOSIS — E1129 Type 2 diabetes mellitus with other diabetic kidney complication: Secondary | ICD-10-CM | POA: Diagnosis not present

## 2022-12-08 DIAGNOSIS — Z992 Dependence on renal dialysis: Secondary | ICD-10-CM | POA: Diagnosis not present

## 2022-12-08 DIAGNOSIS — N186 End stage renal disease: Secondary | ICD-10-CM | POA: Diagnosis not present

## 2022-12-08 DIAGNOSIS — D631 Anemia in chronic kidney disease: Secondary | ICD-10-CM | POA: Diagnosis not present

## 2022-12-08 DIAGNOSIS — I159 Secondary hypertension, unspecified: Secondary | ICD-10-CM | POA: Diagnosis not present

## 2022-12-11 DIAGNOSIS — N186 End stage renal disease: Secondary | ICD-10-CM | POA: Diagnosis not present

## 2022-12-11 DIAGNOSIS — Z992 Dependence on renal dialysis: Secondary | ICD-10-CM | POA: Diagnosis not present

## 2022-12-11 DIAGNOSIS — N2581 Secondary hyperparathyroidism of renal origin: Secondary | ICD-10-CM | POA: Diagnosis not present

## 2022-12-11 DIAGNOSIS — E1129 Type 2 diabetes mellitus with other diabetic kidney complication: Secondary | ICD-10-CM | POA: Diagnosis not present

## 2022-12-11 DIAGNOSIS — I159 Secondary hypertension, unspecified: Secondary | ICD-10-CM | POA: Diagnosis not present

## 2022-12-11 DIAGNOSIS — D631 Anemia in chronic kidney disease: Secondary | ICD-10-CM | POA: Diagnosis not present

## 2022-12-13 DIAGNOSIS — E1129 Type 2 diabetes mellitus with other diabetic kidney complication: Secondary | ICD-10-CM | POA: Diagnosis not present

## 2022-12-13 DIAGNOSIS — E119 Type 2 diabetes mellitus without complications: Secondary | ICD-10-CM | POA: Diagnosis not present

## 2022-12-13 DIAGNOSIS — D631 Anemia in chronic kidney disease: Secondary | ICD-10-CM | POA: Diagnosis not present

## 2022-12-13 DIAGNOSIS — I159 Secondary hypertension, unspecified: Secondary | ICD-10-CM | POA: Diagnosis not present

## 2022-12-13 DIAGNOSIS — D509 Iron deficiency anemia, unspecified: Secondary | ICD-10-CM | POA: Diagnosis not present

## 2022-12-13 DIAGNOSIS — N2581 Secondary hyperparathyroidism of renal origin: Secondary | ICD-10-CM | POA: Diagnosis not present

## 2022-12-13 DIAGNOSIS — N040 Nephrotic syndrome with minor glomerular abnormality: Secondary | ICD-10-CM | POA: Diagnosis not present

## 2022-12-13 DIAGNOSIS — Z992 Dependence on renal dialysis: Secondary | ICD-10-CM | POA: Diagnosis not present

## 2022-12-13 DIAGNOSIS — N186 End stage renal disease: Secondary | ICD-10-CM | POA: Diagnosis not present

## 2022-12-15 DIAGNOSIS — I159 Secondary hypertension, unspecified: Secondary | ICD-10-CM | POA: Diagnosis not present

## 2022-12-15 DIAGNOSIS — N186 End stage renal disease: Secondary | ICD-10-CM | POA: Diagnosis not present

## 2022-12-15 DIAGNOSIS — D631 Anemia in chronic kidney disease: Secondary | ICD-10-CM | POA: Diagnosis not present

## 2022-12-15 DIAGNOSIS — D509 Iron deficiency anemia, unspecified: Secondary | ICD-10-CM | POA: Diagnosis not present

## 2022-12-15 DIAGNOSIS — Z992 Dependence on renal dialysis: Secondary | ICD-10-CM | POA: Diagnosis not present

## 2022-12-15 DIAGNOSIS — N2581 Secondary hyperparathyroidism of renal origin: Secondary | ICD-10-CM | POA: Diagnosis not present

## 2022-12-18 ENCOUNTER — Other Ambulatory Visit: Payer: Self-pay | Admitting: Student

## 2022-12-18 DIAGNOSIS — D689 Coagulation defect, unspecified: Secondary | ICD-10-CM

## 2022-12-18 DIAGNOSIS — Z992 Dependence on renal dialysis: Secondary | ICD-10-CM | POA: Diagnosis not present

## 2022-12-18 DIAGNOSIS — N186 End stage renal disease: Secondary | ICD-10-CM | POA: Diagnosis not present

## 2022-12-18 DIAGNOSIS — D631 Anemia in chronic kidney disease: Secondary | ICD-10-CM | POA: Diagnosis not present

## 2022-12-18 DIAGNOSIS — N2581 Secondary hyperparathyroidism of renal origin: Secondary | ICD-10-CM | POA: Diagnosis not present

## 2022-12-18 DIAGNOSIS — I159 Secondary hypertension, unspecified: Secondary | ICD-10-CM | POA: Diagnosis not present

## 2022-12-18 DIAGNOSIS — D509 Iron deficiency anemia, unspecified: Secondary | ICD-10-CM | POA: Diagnosis not present

## 2022-12-19 ENCOUNTER — Other Ambulatory Visit: Payer: Self-pay

## 2022-12-19 ENCOUNTER — Other Ambulatory Visit (HOSPITAL_COMMUNITY): Payer: Self-pay | Admitting: Hematology

## 2022-12-19 ENCOUNTER — Ambulatory Visit (HOSPITAL_COMMUNITY)
Admission: RE | Admit: 2022-12-19 | Discharge: 2022-12-19 | Disposition: A | Discharge status: Home / Self Care | Payer: Medicare Other | Source: Ambulatory Visit | Attending: Hematology | Admitting: Hematology

## 2022-12-19 ENCOUNTER — Encounter (HOSPITAL_COMMUNITY): Payer: Self-pay

## 2022-12-19 DIAGNOSIS — D689 Coagulation defect, unspecified: Secondary | ICD-10-CM | POA: Diagnosis not present

## 2022-12-19 DIAGNOSIS — R222 Localized swelling, mass and lump, trunk: Secondary | ICD-10-CM | POA: Diagnosis not present

## 2022-12-19 DIAGNOSIS — Z9013 Acquired absence of bilateral breasts and nipples: Secondary | ICD-10-CM | POA: Insufficient documentation

## 2022-12-19 DIAGNOSIS — Z853 Personal history of malignant neoplasm of breast: Secondary | ICD-10-CM | POA: Diagnosis not present

## 2022-12-19 DIAGNOSIS — C7989 Secondary malignant neoplasm of other specified sites: Secondary | ICD-10-CM | POA: Diagnosis not present

## 2022-12-19 DIAGNOSIS — C50911 Malignant neoplasm of unspecified site of right female breast: Secondary | ICD-10-CM

## 2022-12-19 DIAGNOSIS — C801 Malignant (primary) neoplasm, unspecified: Secondary | ICD-10-CM | POA: Diagnosis not present

## 2022-12-19 LAB — CBC
HCT: 32.3 % — ABNORMAL LOW (ref 36.0–46.0)
Hemoglobin: 10.5 g/dL — ABNORMAL LOW (ref 12.0–15.0)
MCH: 33.7 pg (ref 26.0–34.0)
MCHC: 32.5 g/dL (ref 30.0–36.0)
MCV: 103.5 fL — ABNORMAL HIGH (ref 80.0–100.0)
Platelets: 226 10*3/uL (ref 150–400)
RBC: 3.12 MIL/uL — ABNORMAL LOW (ref 3.87–5.11)
RDW: 14.8 % (ref 11.5–15.5)
WBC: 6.7 10*3/uL (ref 4.0–10.5)
nRBC: 0 % (ref 0.0–0.2)

## 2022-12-19 LAB — PROTIME-INR
INR: 1.1 (ref 0.8–1.2)
Prothrombin Time: 14.1 seconds (ref 11.4–15.2)

## 2022-12-19 MED ORDER — SODIUM CHLORIDE 0.9 % IV SOLN: INTRAVENOUS | Status: DC

## 2022-12-19 MED ORDER — MIDAZOLAM HCL 2 MG/2ML IJ SOLN
INTRAMUSCULAR | Status: AC
  Filled 2022-12-19: qty 2

## 2022-12-19 MED ORDER — FENTANYL CITRATE (PF) 100 MCG/2ML IJ SOLN
INTRAMUSCULAR | Status: AC | PRN
  Administered 2022-12-19: 25 ug via INTRAVENOUS

## 2022-12-19 MED ORDER — MIDAZOLAM HCL 2 MG/2ML IJ SOLN
INTRAMUSCULAR | Status: AC | PRN
  Administered 2022-12-19: 1 mg via INTRAVENOUS

## 2022-12-19 MED ORDER — FENTANYL CITRATE (PF) 100 MCG/2ML IJ SOLN
INTRAMUSCULAR | Status: AC
  Filled 2022-12-19: qty 2

## 2022-12-19 NOTE — Procedures (Signed)
Interventional Radiology Procedure Note  Procedure: CT and US guided right chest wall mass biopsy  Indication: Right chest wall mass  Findings: Please refer to procedural dictation for full description.  Complications: None  EBL: < 10 mL  Acquanetta Belling, MD [phone]

## 2022-12-19 NOTE — H&P (Signed)
Chief Complaint: Patient was seen in consultation today for Rt chest wall lesion biopsy at the request of Katragadda,Sreedhar  Referring Physician(s): [name]  Supervising Physician: Mir, Mauri Reading  Patient Status: [facility] - Out-pt  History of Present Illness: Megan [name] is a 62 y.o. female   Hx Right Breast Cancer 2020 Developed Rt chest wall pain weeks ago PET scan performed 11/30/22: IMPRESSION: 1. Recurrent/metastatic disease within the right medial pectoralis musculature in this patient who is status post bilateral mastectomy. 2. A right suprarenal 8 mm nodule is likely arising from the adrenal gland, not readily apparent on the prior, but not tracer avid. Most likely an incidental benign lesion. Recommend attention on follow-up. 3. Incidental findings, including: Coronary artery atherosclerosis. Aortic Atherosclerosis (ICD10-I70.0). Moderate bilateral renal atrophy. Nephrolithiasis.  Plan: per Dr Ellin Saba note 12/05/22 1.  Recurrent right breast cancer, triple receptor negative: - She is tolerating anastrozole very well. - She complained of pain in the right axillary and right anterior chest wall region. - We reviewed PET scan images from 11/30/2022.  This showed recurrent nodule of hypermetabolic in the medial right pectoralis musculature corresponding to subtle soft tissue fullness on the CT component.  SUV of 13.4.  No other abnormal findings. - I have reached out to Dr. Langston Masker and discussed the case with him. - We will go ahead and arrange for biopsy of the suspicious area by IR. - We will also reach out to Dr. Lovell Sheehan to see if this is resectable.  Scheduled today for Right chest wall lesion biopsy    Past Medical History:  Diagnosis Date   (HFpEF) heart failure with preserved ejection fraction (HCC)    a. 01/2019 Echo: EF 55-60%, mild conc LVH. DD.  Torn MV chordae.   Anemia    Atypical chest pain    a. 08/2018 MV: EF 59%, no ischemia; b.  02/2019 Cath: nonobs dzs.   Blood transfusion without reported diagnosis    Breast cancer (HCC) 10/12/2020   Cataract    ESRD (end stage renal disease) on dialysis [facility])    a. HD T, T, S   Essential hypertension, benign    GERD (gastroesophageal reflux disease)    Headache    Hemorrhoids    Mixed hyperlipidemia    Morbid obesity (HCC)    Non-obstructive CAD (coronary artery disease)    a. 02/2019 CathL LM nl, LAD 77m, LCX nl, RCA 25p, EF 55-65%.   PONV (postoperative nausea and vomiting)    S/P colonoscopy Jan 2011   Dr. Elnoria Howard: sessile polyp (benign lymphoid), large hemorrhoids, repeat 5-10 years   Temporal arteritis (HCC)    Type 2 diabetes mellitus (HCC)    Wears glasses     Past Surgical History:  Procedure Laterality Date   ABDOMINAL HYSTERECTOMY     APPENDECTOMY     ARTERY BIOPSY N/A 05/09/2018   Procedure: RIGHT TEMPORAL ARTERY BIOPSY;  Surgeon: Jimmye Norman, MD;  Location: [facility];  Service: General;  Laterality: N/A;   BREAST BIOPSY Right 06/15/2020   Procedure: RIGHT BREAST BIOPSY;  Surgeon: Franky Macho, MD;  Location: [facility];  Service: General;  Laterality: Right;   CATARACT EXTRACTION W/PHACO Left 02/09/2017   Procedure: CATARACT EXTRACTION PHACO AND INTRAOCULAR LENS PLACEMENT LEFT EYE;  Surgeon: Gemma Payor, MD;  Location: [facility];  Service: Ophthalmology;  Laterality: Left;  CDE: 4.89   CATARACT EXTRACTION W/PHACO Right 06/04/2017   Procedure: CATARACT EXTRACTION PHACO AND INTRAOCULAR LENS PLACEMENT (IOC);  Surgeon: Gemma Payor, MD;  Location:  [facility];  Service: Ophthalmology;  Laterality: Right;  CDE: 4.12   CHOLECYSTECTOMY  09/29/2011   Procedure: LAPAROSCOPIC CHOLECYSTECTOMY;  Surgeon: Dalia Heading, MD;  Location: [facility];  Service: General;  Laterality: N/A;   COLONOSCOPY  08/2009   Dr. Elnoria Howard: sessile polyp (benign lymphoid), large hemorrhoids, repeat 5-10 years   COLONOSCOPY N/A 06/12/2016   prominent hemorrhoids   COLONOSCOPY WITH PROPOFOL N/A 06/16/2021    Procedure: COLONOSCOPY WITH PROPOFOL;  Surgeon: Corbin Ade, MD;  [facility];  Service: Endoscopy;  Laterality: N/A;  9:30am (dialysis pt)   ESOPHAGOGASTRODUODENOSCOPY  09/05/2011   EAV:WUJWJ hiatal hernia; remainder of exam normal. No explanation for patient's abdominal pain with today's examination   ESOPHAGOGASTRODUODENOSCOPY N/A 12/17/2013   Dr. Jena Gauss: gastric erythema, erosion, mild chronic inflammation on path    ESOPHAGOGASTRODUODENOSCOPY (EGD) WITH PROPOFOL N/A 06/16/2021   Procedure: ESOPHAGOGASTRODUODENOSCOPY (EGD) WITH PROPOFOL;  Surgeon: Corbin Ade, MD;  [facility];  Service: Endoscopy;  Laterality: N/A;   EXCISION OF BREAST BIOPSY Right 10/12/2020   Procedure: EXCISION OF RIGHT BREAST BIOPSY;  Surgeon: Franky Macho, MD;  Location: [facility];  Service: General;  Laterality: Right;   LAPAROSCOPIC APPENDECTOMY  09/29/2011   Procedure: APPENDECTOMY LAPAROSCOPIC;  Surgeon: Dalia Heading, MD;  Location: [facility];  Service: General;;  incidental appendectomy   LEFT HEART CATH AND CORONARY ANGIOGRAPHY N/A 02/28/2019   Procedure: LEFT HEART CATH AND CORONARY ANGIOGRAPHY;  Surgeon: Corky Crafts, MD;  Location: [facility] INVASIVE CV LAB;  Service: Cardiovascular;  Laterality: N/A;   MASTECTOMY MODIFIED RADICAL Right 02/18/2020   Procedure: MASTECTOMY MODIFIED RADICAL;  Surgeon: Franky Macho, MD;  Location: [facility];  Service: General;  Laterality: Right;   MASTECTOMY, PARTIAL Right 07/13/2020   Procedure: RIGHT PARTIAL MASTECTOMY;  Surgeon: Franky Macho, MD;  Location: [facility];  Service: General;  Laterality: Right;   PARTIAL MASTECTOMY WITH NEEDLE LOCALIZATION AND AXILLARY SENTINEL LYMPH NODE BX Right 09/18/2018   Procedure: RIGHT PARTIAL MASTECTOMY AFTER NEEDLE LOCALIZATION, SENTINEL LYMPH NODE BIOPSY RIGHT AXILLA;  Surgeon: Franky Macho, MD;  Location: [facility];  Service: General;  Laterality: Right;   POLYPECTOMY  06/16/2021   Procedure: POLYPECTOMY;  Surgeon:  Corbin Ade, MD;  [facility];  Service: Endoscopy;;   SIMPLE MASTECTOMY WITH AXILLARY SENTINEL NODE BIOPSY Left 06/15/2020   Procedure: LEFT SIMPLE MASTECTOMY;  Surgeon: Franky Macho, MD;  Location: [facility];  Service: General;  Laterality: Left;    Allergies: Patient has no known allergies.  Medications: Prior to Admission medications   Medication Sig Start Date End Date Taking? Authorizing Provider  amLODipine (NORVASC) 10 MG tablet TAKE 1 TABLET BY MOUTH AT BEDTIME 08/15/22  Yes Donita Brooks, MD  anastrozole (ARIMIDEX) 1 MG tablet TAKE 1 TABLET(1 MG) BY MOUTH DAILY 10/16/22  Yes [name], MD  aspirin EC 81 MG tablet Take 81 mg by mouth daily.   Yes [provider]  atorvastatin (LIPITOR) 20 MG tablet Take 1 tablet (20 mg total) by mouth daily. TAKE 1 TABLET DAILY 03/02/22  Yes Donita Brooks, MD  B Complex-C-Zn-Folic Acid (DIALYVITE 800-ZINC 15) 0.8 MG TABS Take 1 tablet by mouth daily. 06/19/22  Yes [provider]  cinacalcet (SENSIPAR) 30 MG tablet Take 2 tablets (60 mg total) by mouth daily. Patient taking differently: Take 30 mg by mouth daily. 06/13/18  Yes Donita Brooks, MD  Darbepoetin Alfa (ARANESP, ALBUMIN FREE, IJ) Darbepoetin Alfa (Aranesp) 06/23/22 06/22/23 Yes [provider]  fluticasone (FLONASE) 50 MCG/ACT nasal spray Place 2 sprays into both nostrils daily. 10/20/22  Yes Donita Brooks, MD  gabapentin (NEURONTIN) 300 MG capsule TAKE 1 CAPSULE(300 MG) BY MOUTH AT BEDTIME 10/31/22  Yes Donita Brooks, MD  isosorbide mononitrate (IMDUR) 30 MG 24 hr tablet Take 1.5 tablets (45 mg total) by mouth daily. 09/13/22  Yes Branch, [name], MD  lanthanum (FOSRENOL) 1000 MG chewable tablet Chew 2,000-3,000 mg by mouth See admin instructions. Take 3 tablets (3000 mg) by mouth with meals and take 2 tablets (2000 mg) with snacks 10/16/19  Yes [provider]  lidocaine-prilocaine (EMLA) cream Apply 1 application   topically every Monday, Wednesday, and Friday with hemodialysis. 09/24/18  Yes [provider]  nebivolol (BYSTOLIC) 10 MG tablet Take 1 tablet (10 mg total) by mouth in the morning and at bedtime. 04/13/22  Yes Donita Brooks, MD  ondansetron (ZOFRAN) 4 MG tablet Take 1 tablet (4 mg total) by mouth every 8 (eight) hours as needed for nausea or vomiting. 11/09/22  Yes Park Meo, FNP  oxyCODONE-acetaminophen (PERCOCET/ROXICET) 5-325 MG tablet  10/20/22  Yes [provider]  pantoprazole (PROTONIX) 40 MG tablet TAKE 1 TABLET(40 MG) BY MOUTH DAILY 10/16/22  Yes Donita Brooks, MD  sucralfate (CARAFATE) 1 GM/10ML suspension Take 10 mLs (1 g total) by mouth with breakfast, with lunch, and with evening meal. 10/20/22  Yes Donita Brooks, MD  meclizine (ANTIVERT) 25 MG tablet TAKE 1 TABLET(25 MG) BY MOUTH THREE TIMES DAILY AS NEEDED FOR DIZZINESS 03/21/22   Donita Brooks, MD  nitroGLYCERIN (NITROSTAT) 0.4 MG SL tablet Place 1 tablet (0.4 mg total) under the tongue every 5 (five) minutes x 3 doses as needed for chest pain (if no relief after 3rd dose, proceed to ED or call 911). 09/13/22   Antoine Poche, MD     Family History  Problem Relation Age of Onset   Hypertension Mother    Coronary artery disease Mother    Diabetes Mother    Hypertension Sister    Coronary artery disease Sister    Hypertension Brother    Heart attack Father    Hypertension Son    Heart attack Maternal Aunt    Hypertension Maternal Aunt    Diabetes Maternal Aunt    Heart attack Maternal Uncle    Hypertension Maternal Uncle    Diabetes Maternal Uncle    Heart attack Paternal Aunt    Hypertension Paternal Aunt    Diabetes Paternal Aunt    Heart attack Paternal Uncle    Hypertension Paternal Uncle    Diabetes Paternal Uncle    Heart attack Maternal Grandmother    Heart attack Maternal Grandfather    Heart attack Paternal Grandmother    Heart attack Paternal Grandfather    Colon cancer Neg  Hx     Social History   Socioeconomic History   Marital status: Married    Spouse name: Not on file   Number of children: Not on file   Years of education: Not on file   Highest education level: Not on file  Occupational History   Occupation: Systems developer: [employer]  Tobacco Use   Smoking status: Never    Passive exposure: Never   Smokeless tobacco: Never  Vaping Use   Vaping Use: Never used  Substance and Sexual Activity   Alcohol use: No   Drug use: No   Sexual activity:  Yes    Birth control/protection: Surgical  Other Topics Concern   Not on file  Social History Narrative   Works at [employer] in [place].    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   Also has to get items from high shelves-causes achy pain in shoulder area      Married.   Children are grown, out of house.   Social Determinants of Health   Financial Resource Strain: Low Risk  (10/10/2022)   Overall Financial Resource Strain (CARDIA)    Difficulty of Paying Living Expenses: Not hard at all  Food Insecurity: No Food Insecurity (10/10/2022)   Hunger Vital Sign    Worried About Running Out of Food in the Last Year: Never true    Ran Out of Food in the Last Year: Never true  Transportation Needs: No Transportation Needs (09/14/2022)   PRAPARE - Administrator, Civil Service (Medical): No    Lack of Transportation (Non-Medical): No  Physical Activity: Inactive (09/14/2022)   Exercise Vital Sign    Days of Exercise per Week: 0 days    Minutes of Exercise per Session: 0 min  Stress: No Stress Concern Present (09/14/2022)   [employer] of Occupational Health - Occupational Stress Questionnaire    Feeling of Stress : Not at all  Social Connections: Socially Integrated (09/14/2022)   Social Connection and Isolation Panel [NHANES]    Frequency of Communication with Friends and Family: More than three times a week    Frequency of Social Gatherings with Friends and  Family: Three times a week    Attends Religious Services: More than 4 times per year    Active Member of Clubs or Organizations: Yes    Attends Banker Meetings: More than 4 times per year    Marital Status: Married    Review of Systems: A 12 point ROS discussed and pertinent positives are indicated in the HPI above.  All other systems are negative.  Review of Systems  Constitutional:  Negative for activity change, fatigue and fever.  Respiratory:  Negative for cough and shortness of breath.   Cardiovascular:  Positive for chest pain.       Rt chest wall pain  Musculoskeletal:  Negative for back pain.  Neurological:  Negative for weakness.  Psychiatric/Behavioral:  Negative for behavioral problems and confusion.     Vital Signs: BP (!) 148/83 (BP Location: Right Arm)   Pulse 74   Temp 98.8 F (37.1 C) (Oral)   Resp 17   Ht 5\' 1"  (1.549 m)   Wt 184 lb (83.5 kg)   BMI 34.77 kg/m     Physical Exam Vitals reviewed.  HENT:     Mouth/Throat:     Mouth: Mucous membranes are moist.  Cardiovascular:     Rate and Rhythm: Normal rate and regular rhythm.     Heart sounds: Normal heart sounds.  Pulmonary:     Effort: Pulmonary effort is normal.     Breath sounds: Normal breath sounds.  Abdominal:     Palpations: Abdomen is soft.  Musculoskeletal:        General: Normal range of motion.  Skin:    General: Skin is warm.  Neurological:     Mental Status: She is alert and oriented to person, place, and time.  Psychiatric:        Behavior: Behavior normal.     Imaging: NM PET Image Restag (PS) Skull Base To  Thigh  Result Date: 12/04/2022 CLINICAL DATA:  Subsequent treatment strategy for right-sided breast cancer. Restaging. Bilateral mastectomy 5 years ago. Chemotherapy 4 years ago. Radiation therapy 2 years ago. Right axillary pain. EXAM: NUCLEAR MEDICINE PET SKULL BASE TO THIGH TECHNIQUE: 9.1 mCi F-18 FDG was injected intravenously. Full-ring PET imaging was  performed from the skull base to thigh after the radiotracer. CT data was obtained and used for attenuation correction and anatomic localization. Fasting blood glucose: 136 mg/dl COMPARISON:  16/05/9603 abdominopelvic CT. 09/06/2021 bone scan. Most recent PET 09/14/2020 FINDINGS: Mediastinal blood pool activity: SUV max 2.6 Liver activity: SUV max NA NECK: No areas of abnormal hypermetabolism. Incidental CT findings: No cervical adenopathy. Left carotid atherosclerosis. CHEST: No pulmonary parenchymal or thoracic nodal hypermetabolism. Incidental CT findings: Right axillary node dissection. No axillary or subpectoral adenopathy. Tiny hiatal hernia. Mild cardiomegaly. Aortic and coronary artery calcification. Right apical radiation change. Vague right lower lobe 5 mm nodule and 97/3 is similar to the prior, favoring a benign etiology. Tiny left apical pulmonary nodules x2 including on 62 and 65 of series 3 are unchanged and presumed benign. ABDOMEN/PELVIS: No abdominopelvic parenchymal or nodal hypermetabolism. Incidental CT findings: Cholecystectomy. Normal left adrenal gland. An 8 mm right suprarenal nodule is favored to arise from the adrenal gland and is not tracer avid on 136/3. Not readily apparent on the prior. Moderate bilateral renal cortical thinning. A right renal mildly hypoattenuating 7 mm lesion is similar to on the prior, favoring a hemorrhagic/proteinaceous cyst. Bilateral renal collecting system calculi. Abdominal aortic atherosclerosis. Apparent colonic wall thickening involving the ascending and transverse segments, favored to be due to underdistention. Bilateral internal iliac stents.  Hysterectomy. SKELETON: Status post bilateral mastectomy. About the medial right pectoralis musculature is hypermetabolism corresponding to subtle soft tissue fullness. Example at a S.U.V. max of 13.497/3. No abnormal marrow activity. Incidental CT findings: No acute osseous abnormality. IMPRESSION: 1.  Recurrent/metastatic disease within the right medial pectoralis musculature in this patient who is status post bilateral mastectomy. 2. A right suprarenal 8 mm nodule is likely arising from the adrenal gland, not readily apparent on the prior, but not tracer avid. Most likely an incidental benign lesion. Recommend attention on follow-up. 3. Incidental findings, including: Coronary artery atherosclerosis. Aortic Atherosclerosis (ICD10-I70.0). Moderate bilateral renal atrophy. Nephrolithiasis. Electronically Signed   By: Jeronimo Greaves M.D.   On: 12/04/2022 10:54    Labs:  CBC: Recent Labs    04/27/22 2026 07/04/22 0802 08/15/22 1045 09/12/22 1120  WBC 9.0 8.4 8.7 7.6  HGB 11.7* 11.9* 10.6* 11.2*  HCT 35.6* 36.4 32.2* 34.6*  PLT 249 273 273 269    COAGS: No results for input(s): "INR", "APTT" in the last 8760 hours.  BMP: Recent Labs    04/27/22 1630 07/04/22 0802 08/15/22 1045 09/12/22 1120  NA 140 139 139 138  K 3.9 3.4* 3.6 3.8  CL 96* 97* 97* 96*  CO2 29 31 26 27   GLUCOSE 102* 130* 110* 96  BUN 46* 27* 64* 46*  CALCIUM 10.2 9.7 9.4 9.8  CREATININE 10.37* 6.86* 12.42* 10.35*  GFRNONAA 4* 6* 3* 4*    LIVER FUNCTION TESTS: Recent Labs    02/26/22 0940 04/27/22 1630 07/04/22 0802 09/12/22 1120  BILITOT 0.6 0.7 0.8 0.7  AST 19 18 19 26   ALT 22 24 22 27   ALKPHOS 82 85 94 90  PROT 7.5 7.8 8.3* 7.3  ALBUMIN 3.6 3.8 4.0 3.5    TUMOR MARKERS: No results for input(s): "AFPTM", "CEA", "  CA199", "CHROMGRNA" in the last 8760 hours.  Assessment and Plan:  Hx Breast Ca Right chest wall lesion +PET Risks and benefits of Right chest wall lesion biopsy was discussed with the patient and/or patient's family including, but not limited to bleeding, infection, damage to adjacent structures or low yield requiring additional tests.  All of the questions were answered and there is agreement to proceed.  Consent signed and in chart.  Thank you for this interesting consult.  I  greatly enjoyed meeting Megan [name] and look forward to participating in their care.  A copy of this report was sent to the requesting provider on this date.  Electronically Signed: Robet Leu, PA-C 12/19/2022, 9:04 AM   I spent a total of  30 Minutes   in face to face in clinical consultation, greater than 50% of which was counseling/coordinating care for Rt chest wall lesion bx

## 2022-12-20 DIAGNOSIS — D631 Anemia in chronic kidney disease: Secondary | ICD-10-CM | POA: Diagnosis not present

## 2022-12-20 DIAGNOSIS — I159 Secondary hypertension, unspecified: Secondary | ICD-10-CM | POA: Diagnosis not present

## 2022-12-20 DIAGNOSIS — D509 Iron deficiency anemia, unspecified: Secondary | ICD-10-CM | POA: Diagnosis not present

## 2022-12-20 DIAGNOSIS — N186 End stage renal disease: Secondary | ICD-10-CM | POA: Diagnosis not present

## 2022-12-20 DIAGNOSIS — Z992 Dependence on renal dialysis: Secondary | ICD-10-CM | POA: Diagnosis not present

## 2022-12-20 DIAGNOSIS — N2581 Secondary hyperparathyroidism of renal origin: Secondary | ICD-10-CM | POA: Diagnosis not present

## 2022-12-21 LAB — SURGICAL PATHOLOGY

## 2022-12-22 DIAGNOSIS — Z992 Dependence on renal dialysis: Secondary | ICD-10-CM | POA: Diagnosis not present

## 2022-12-22 DIAGNOSIS — N186 End stage renal disease: Secondary | ICD-10-CM | POA: Diagnosis not present

## 2022-12-22 DIAGNOSIS — D509 Iron deficiency anemia, unspecified: Secondary | ICD-10-CM | POA: Diagnosis not present

## 2022-12-22 DIAGNOSIS — N2581 Secondary hyperparathyroidism of renal origin: Secondary | ICD-10-CM | POA: Diagnosis not present

## 2022-12-22 DIAGNOSIS — D631 Anemia in chronic kidney disease: Secondary | ICD-10-CM | POA: Diagnosis not present

## 2022-12-22 DIAGNOSIS — I159 Secondary hypertension, unspecified: Secondary | ICD-10-CM | POA: Diagnosis not present

## 2022-12-25 DIAGNOSIS — D509 Iron deficiency anemia, unspecified: Secondary | ICD-10-CM | POA: Diagnosis not present

## 2022-12-25 DIAGNOSIS — Z992 Dependence on renal dialysis: Secondary | ICD-10-CM | POA: Diagnosis not present

## 2022-12-25 DIAGNOSIS — D631 Anemia in chronic kidney disease: Secondary | ICD-10-CM | POA: Diagnosis not present

## 2022-12-25 DIAGNOSIS — N186 End stage renal disease: Secondary | ICD-10-CM | POA: Diagnosis not present

## 2022-12-25 DIAGNOSIS — N2581 Secondary hyperparathyroidism of renal origin: Secondary | ICD-10-CM | POA: Diagnosis not present

## 2022-12-25 DIAGNOSIS — I159 Secondary hypertension, unspecified: Secondary | ICD-10-CM | POA: Diagnosis not present

## 2022-12-25 NOTE — Progress Notes (Signed)
[facility] [address] [place] [address]    Clinic Day:  12/26/2022  Referring physician: Donita Brooks, MD  Patient Care Team: Donita Brooks, MD as PCP - General (Family Medicine) [name] [name], MD as PCP - Cardiology (Cardiology) [name], MD (Gastroenterology) [name], MD as Consulting Physician (Cardiology) [name], MD as Medical Oncologist (Medical Oncology) [name], MD as Consulting Physician (Nephrology)   ASSESSMENT & PLAN:   Assessment: 1.  Stage Ib (T1CN0) right breast cancer, ER positive, PR and HER-2 negative: -Right lumpectomy and sentinel lymph node biopsy on 09/18/2018 shows 1.5 cm IDC, grade 3, associated with high-grade DCIS, negative margins, 0/3 lymph nodes positive, ER 50%, PR negative and HER-2 negative, Ki-67 60%. -Oncotype DX recurrence score 47.  Distant recurrence at 9 years with tamoxifen alone is 36%.  Absolute chemotherapy benefit is more than 15%. -Adjuvant chemotherapy with 4 cycles of AC from 10/30/2018 through 01/01/2019. -She was started on anastrozole in June 2020. -Mammogram on 09/02/2019 showed calcifications in the posterior aspect of upper outer quadrant of the right breast. -Right partial mastectomy on 02/18/2020 shows high-grade DCIS, 1.5 cm, no invasive carcinoma, resection margins negative.  0/11 lymph nodes.  ER 30% positive, PR negative. -Left simple mastectomy on 06/15/2020 was benign.  Right breast biopsy shows microscopic focus of invasive ductal carcinoma in the background of extensive high-grade DCIS. -Right breast lumpectomy on 07/13/2020 shows multifocal invasive ductal carcinoma, grade 3, largest measuring 1.2 cm.  Extensive high-grade DCIS with necrosis.  Margins are free.  PT1CNX.  There are multiple foci of invasive carcinoma arising from extensive DCIS.  Several small foci of invasive tumor arising from DCIS indicating new primaries.  ER/PR/HER-2 is negative.   Ki-67 is 20%. -PET scan on 09/14/2020 shows areas of nodularity with discrete nodule in the fat of the inferior chest wall within the subcutaneous tissues. -Ultrasound of the right chest wall shows masslike abnormality in the upper inner aspect of the right anterior chest at 1 o'clock position measuring 4.1 cm in greatest dimension.  1.7 cm hypoechoic mass in the central anterior right chest concerning for malignancy.  Possible borderline enlarged residual right axillary lymph node. -Right breast biopsy on 10/12/2020 shows 1.3 cm invasive ductal carcinoma, focally 0.1 cm from superior margin.  DCIS is less than 0.1 cm from superior margin.  PT1CPNX. - Right mastectomy followed by radiation therapy completed in May 2022. - PET scan (11/30/2022): Recurrent/metastatic disease within the right medial pectoralis musculature. - Biopsy (12/19/2022): Metastatic breast adenocarcinoma.  ER positive, 40%, weak staining.  PR negative.  Ki-67 30%.  HER2 IHC 0.   2.  Osteopenia: -Bone density on 01/20/2019 shows T score -1.9. - DEXA scan (03/24/2021) T score -2.4    Plan: 1.  Recurrent right breast cancer, triple receptor negative: - She is continuing to take anastrozole. - We discussed results of the biopsy which showed metastatic breast adenocarcinoma. - Recommend surgical evaluation by Dr. Lovell Sheehan. - If it is not resectable, she will be evaluated by Dr. Langston Masker for radiation. - She complains of headaches, bandlike for few months to close to a year.  No nausea associated with it.  No vision changes.  We will obtain MRI of the brain with and without contrast. - Will consider switching anastrozole to either Faslodex or adding CDK 4 inhibitor if she cannot get surgery or radiation.   2.  Osteopenia/vitamin D deficiency: - Continue Nephro-Vite and vitamin D.  3.  ESRD on HD: - Continue HD on Monday, Wednesday and Friday under the direction of Dr. Arrie Aran.   4.  Neuropathy: - Continue gabapentin at bedtime.     Orders Placed This Encounter  Procedures   MR Brain W Wo Contrast    Standing Status:   Future    Standing Expiration Date:   12/26/2023    Order Specific Question:   If indicated for the ordered procedure, I authorize the administration of contrast media per Radiology protocol    Answer:   Yes    Order Specific Question:   What is the patient's sedation requirement?    Answer:   No Sedation    Order Specific Question:   Does the patient have a pacemaker or implanted devices?    Answer:   No    Order Specific Question:   Use SRS Protocol?    Answer:   No    Order Specific Question:   Preferred imaging location?    Answer:   [facility] (table limit - 550lbs)      I,Katie Daubenspeck,acting as a scribe for [name], MD.,have documented all relevant documentation on the behalf of [name], MD,as directed by  [name], MD while in the presence of [name], MD.   I, [name] MD, have reviewed the above documentation for accuracy and completeness, and I agree with the above.   [name], MD   5/14/20245:58 PM  CHIEF COMPLAINT:   Diagnosis: recurrent right breast cancer    Cancer Staging  Malignant neoplasm of upper-inner quadrant of right female breast [facility]) Staging form: Breast, AJCC 8th Edition - Clinical stage from 09/26/2018: Stage IB (cT1c, cN0, cM0, G3, ER+, PR-, HER2-) - Signed by [name], MD on 09/26/2018    Prior Therapy: 1. Right lumpectomy and sentinel lymph node biopsy on 09/18/2018  2. Adjuvant chemotherapy with 4 cycles of AC from 10/30/2018 through 01/01/2019  3. Right partial mastectomy on 02/18/2020  4. Right breast lumpectomy on 07/13/2020  5. Right mastectomy followed by radiation therapy completed in May 2022   Current Therapy:  anastrozole started June 2020    HISTORY OF PRESENT ILLNESS:   Oncology History  Malignant neoplasm of upper-inner quadrant of right female breast  (HCC)  09/18/2018 Initial Diagnosis   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)   09/26/2018 Cancer Staging   Staging form: Breast, AJCC 8th Edition - Clinical stage from 09/26/2018: Stage IB (cT1c, cN0, cM0, G3, ER+, PR-, HER2-) - Signed by [name], MD on 09/26/2018   10/30/2018 - 01/01/2019 Chemotherapy   Patient is on Treatment Plan : BREAST Adjuvant AC q21d        INTERVAL HISTORY:   Megan [name] is a 62 y.o. female presenting to clinic today for follow up of recurrent right breast cancer. She was last seen by me on 12/05/22.  Since her last visit, she underwent biopsy of the right chest wall soft tissue mass on 12/19/22. Pathology confirmed metastatic adenocarcinoma, with morphological and immunohistochemical features compatible with patient's known breast cancer. ER 40% weakly positive, PR 0% negative, Ki-67 of 30%, and Her2 negative (0).  Today, she states that she is doing well overall. Her appetite level is at 90%. Her energy level is at 75%.  PAST MEDICAL HISTORY:   Past Medical History: Past Medical History:  Diagnosis Date   (HFpEF) heart failure with preserved ejection fraction (HCC)    a. 01/2019 Echo: EF 55-60%, mild  conc LVH. DD.  Torn MV chordae.   Anemia    Atypical chest pain    a. 08/2018 MV: EF 59%, no ischemia; b. 02/2019 Cath: nonobs dzs.   Blood transfusion without reported diagnosis    Breast cancer (HCC) 10/12/2020   Cataract    ESRD (end stage renal disease) on dialysis [facility])    a. HD T, T, S   Essential hypertension, benign    GERD (gastroesophageal reflux disease)    Headache    Hemorrhoids    Mixed hyperlipidemia    Morbid obesity (HCC)    Non-obstructive CAD (coronary artery disease)    a. 02/2019 CathL LM nl, LAD 57m, LCX nl, RCA 25p, EF 55-65%.   PONV (postoperative nausea and vomiting)    S/P colonoscopy Jan 2011   Dr. Elnoria Howard: sessile polyp (benign lymphoid), large hemorrhoids, repeat 5-10 years   Temporal arteritis  (HCC)    Type 2 diabetes mellitus (HCC)    Wears glasses     Surgical History: Past Surgical History:  Procedure Laterality Date   ABDOMINAL HYSTERECTOMY     APPENDECTOMY     ARTERY BIOPSY N/A 05/09/2018   Procedure: RIGHT TEMPORAL ARTERY BIOPSY;  Surgeon: Jimmye Norman, MD;  Location: [facility] OR;  Service: General;  Laterality: N/A;   BREAST BIOPSY Right 06/15/2020   Procedure: RIGHT BREAST BIOPSY;  Surgeon: Franky Macho, MD;  Location: [facility] ORS;  Service: General;  Laterality: Right;   CATARACT EXTRACTION W/PHACO Left 02/09/2017   Procedure: CATARACT EXTRACTION PHACO AND INTRAOCULAR LENS PLACEMENT LEFT EYE;  Surgeon: Gemma Payor, MD;  Location: [facility];  Service: Ophthalmology;  Laterality: Left;  CDE: 4.89   CATARACT EXTRACTION W/PHACO Right 06/04/2017   Procedure: CATARACT EXTRACTION PHACO AND INTRAOCULAR LENS PLACEMENT (IOC);  Surgeon: Gemma Payor, MD;  Location: [facility];  Service: Ophthalmology;  Laterality: Right;  CDE: 4.12   CHOLECYSTECTOMY  09/29/2011   Procedure: LAPAROSCOPIC CHOLECYSTECTOMY;  Surgeon: Dalia Heading, MD;  Location: [facility] OR[facility];  Service: General;  Laterality: N/A;   COLONOSCOPY  08/2009   Dr. Elnoria Howard: sessile polyp (benign lymphoid), large hemorrhoids, repeat 5-10 years   COLONOSCOPY N/A 06/12/2016   prominent hemorrhoids   COLONOSCOPY WITH PROPOFOL N/A 06/16/2021   Procedure: COLONOSCOPY WITH PROPOFOL;  Surgeon: Corbin Ade, MD;  Location: [facility];  Service: Endoscopy;  Laterality: N/A;  9:30am (dialysis pt)   ESOPHAGOGASTRODUODENOSCOPY  09/05/2011   [id number] hiatal hernia; remainder of exam normal. No explanation for patient's abdominal pain with today's examination   ESOPHAGOGASTRODUODENOSCOPY N/A 12/17/2013   Dr. Jena Gauss: gastric erythema, erosion, mild chronic inflammation on path    ESOPHAGOGASTRODUODENOSCOPY (EGD) WITH PROPOFOL N/A 06/16/2021   Procedure: ESOPHAGOGASTRODUODENOSCOPY (EGD) WITH PROPOFOL;  Surgeon: Corbin Ade, MD;  [facility];   Service: Endoscopy;  Laterality: N/A;   EXCISION OF BREAST BIOPSY Right 10/12/2020   Procedure: EXCISION OF RIGHT BREAST BIOPSY;  Surgeon: Franky Macho, MD;  Location: [facility];  Service: General;  Laterality: Right;   LAPAROSCOPIC APPENDECTOMY  09/29/2011   Procedure: APPENDECTOMY LAPAROSCOPIC;  Surgeon: Dalia Heading, MD;  Location: [facility];  Service: General;;  incidental appendectomy   LEFT HEART CATH AND CORONARY ANGIOGRAPHY N/A 02/28/2019   Procedure: LEFT HEART CATH AND CORONARY ANGIOGRAPHY;  Surgeon: Corky Crafts, MD;  Location: [facility] INVASIVE CV LAB;  Service: Cardiovascular;  Laterality: N/A;   MASTECTOMY MODIFIED RADICAL Right 02/18/2020   Procedure: MASTECTOMY MODIFIED RADICAL;  Surgeon: Franky Macho, MD;  Location: [facility];  Service:  General;  Laterality: Right;   MASTECTOMY, PARTIAL Right 07/13/2020   Procedure: RIGHT PARTIAL MASTECTOMY;  Surgeon: Franky Macho, MD;  Location: [facility];  Service: General;  Laterality: Right;   PARTIAL MASTECTOMY WITH NEEDLE LOCALIZATION AND AXILLARY SENTINEL LYMPH NODE BX Right 09/18/2018   Procedure: RIGHT PARTIAL MASTECTOMY AFTER NEEDLE LOCALIZATION, SENTINEL LYMPH NODE BIOPSY RIGHT AXILLA;  Surgeon: Franky Macho, MD;  Location: [facility];  Service: General;  Laterality: Right;   POLYPECTOMY  06/16/2021   Procedure: POLYPECTOMY;  Surgeon: Corbin Ade, MD;  [facility];  Service: Endoscopy;;   SIMPLE MASTECTOMY WITH AXILLARY SENTINEL NODE BIOPSY Left 06/15/2020   Procedure: LEFT SIMPLE MASTECTOMY;  Surgeon: Franky Macho, MD;  Location: [facility];  Service: General;  Laterality: Left;    Social History: Social History   Socioeconomic History   Marital status: Married    Spouse name: Not on file   Number of children: Not on file   Years of education: Not on file   Highest education level: Not on file  Occupational History   Occupation: Food Corporate treasurer: [id number]  Tobacco Use   Smoking status: Never    Passive  exposure: Never   Smokeless tobacco: Never  Vaping Use   Vaping Use: Never used  Substance and Sexual Activity   Alcohol use: No   Drug use: No   Sexual activity: Yes    Birth control/protection: Surgical  Other Topics Concern   Not on file  Social History Narrative   Works at [employer] in [place].    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   Also has to get items from high shelves-causes achy pain in shoulder area      Married.   Children are grown, out of house.   Social Determinants of Health   Financial Resource Strain: Low Risk  (10/10/2022)   Overall Financial Resource Strain (CARDIA)    Difficulty of Paying Living Expenses: Not hard at all  Food Insecurity: No Food Insecurity (10/10/2022)   Hunger Vital Sign    Worried About Running Out of Food in the Last Year: Never true    Ran Out of Food in the Last Year: Never true  Transportation Needs: No Transportation Needs (09/14/2022)   PRAPARE - Administrator, Civil Service (Medical): No    Lack of Transportation (Non-Medical): No  Physical Activity: Inactive (09/14/2022)   Exercise Vital Sign    Days of Exercise per Week: 0 days    Minutes of Exercise per Session: 0 min  Stress: No Stress Concern Present (09/14/2022)   [employer] of Occupational Health - Occupational Stress Questionnaire    Feeling of Stress : Not at all  Social Connections: Socially Integrated (09/14/2022)   Social Connection and Isolation Panel [NHANES]    Frequency of Communication with Friends and Family: More than three times a week    Frequency of Social Gatherings with Friends and Family: Three times a week    Attends Religious Services: More than 4 times per year    Active Member of Clubs or Organizations: Yes    Attends Banker Meetings: More than 4 times per year    Marital Status: Married  Catering manager Violence: Not At Risk (09/14/2022)   Humiliation, Afraid, Rape, and Kick questionnaire    Fear of  Current or Ex-Partner: No    Emotionally Abused: No    Physically Abused: No  Sexually Abused: No    Family History: Family History  Problem Relation Age of Onset   Hypertension Mother    Coronary artery disease Mother    Diabetes Mother    Hypertension Sister    Coronary artery disease Sister    Hypertension Brother    Heart attack Father    Hypertension Son    Heart attack Maternal Aunt    Hypertension Maternal Aunt    Diabetes Maternal Aunt    Heart attack Maternal Uncle    Hypertension Maternal Uncle    Diabetes Maternal Uncle    Heart attack Paternal Aunt    Hypertension Paternal Aunt    Diabetes Paternal Aunt    Heart attack Paternal Uncle    Hypertension Paternal Uncle    Diabetes Paternal Uncle    Heart attack Maternal Grandmother    Heart attack Maternal Grandfather    Heart attack Paternal Grandmother    Heart attack Paternal Grandfather    Colon cancer Neg Hx     Current Medications:  Current Outpatient Medications:    amLODipine (NORVASC) 10 MG tablet, TAKE 1 TABLET BY MOUTH AT BEDTIME, Disp: 90 tablet, Rfl: 3   anastrozole (ARIMIDEX) 1 MG tablet, TAKE 1 TABLET(1 MG) BY MOUTH DAILY, Disp: 30 tablet, Rfl: 6   aspirin EC 81 MG tablet, Take 81 mg by mouth daily., Disp: , Rfl:    atorvastatin (LIPITOR) 20 MG tablet, Take 1 tablet (20 mg total) by mouth daily. TAKE 1 TABLET DAILY, Disp: 90 tablet, Rfl: 3   B Complex-C-Zn-Folic Acid (DIALYVITE 800-ZINC 15) 0.8 MG TABS, Take 1 tablet by mouth daily., Disp: , Rfl:    cinacalcet (SENSIPAR) 30 MG tablet, Take 2 tablets (60 mg total) by mouth daily. (Patient taking differently: Take 30 mg by mouth daily.), Disp: 90 tablet, Rfl: 0   Darbepoetin Alfa (ARANESP, ALBUMIN FREE, IJ), Darbepoetin Alfa (Aranesp), Disp: , Rfl:    fluticasone (FLONASE) 50 MCG/ACT nasal spray, Place 2 sprays into both nostrils daily., Disp: 16 g, Rfl: 6   gabapentin (NEURONTIN) 300 MG capsule, TAKE 1 CAPSULE(300 MG) BY MOUTH AT BEDTIME, Disp: 90  capsule, Rfl: 1   isosorbide mononitrate (IMDUR) 30 MG 24 hr tablet, Take 1.5 tablets (45 mg total) by mouth daily., Disp: 135 tablet, Rfl: 3   lanthanum (FOSRENOL) 1000 MG chewable tablet, Chew 2,000-3,000 mg by mouth See admin instructions. Take 3 tablets (3000 mg) by mouth with meals and take 2 tablets (2000 mg) with snacks, Disp: , Rfl:    lidocaine-prilocaine (EMLA) cream, Apply 1 application  topically every Monday, Wednesday, and Friday with hemodialysis., Disp: , Rfl:    meclizine (ANTIVERT) 25 MG tablet, TAKE 1 TABLET(25 MG) BY MOUTH THREE TIMES DAILY AS NEEDED FOR DIZZINESS, Disp: 30 tablet, Rfl: 1   nebivolol (BYSTOLIC) 10 MG tablet, Take 1 tablet (10 mg total) by mouth in the morning and at bedtime., Disp: 180 tablet, Rfl: 3   nitroGLYCERIN (NITROSTAT) 0.4 MG SL tablet, Place 1 tablet (0.4 mg total) under the tongue every 5 (five) minutes x 3 doses as needed for chest pain (if no relief after 3rd dose, proceed to ED or call 911)., Disp: 25 tablet, Rfl: 3   ondansetron (ZOFRAN) 4 MG tablet, Take 1 tablet (4 mg total) by mouth every 8 (eight) hours as needed for nausea or vomiting., Disp: 10 tablet, Rfl: 0   oxyCODONE-acetaminophen (PERCOCET/ROXICET) 5-325 MG tablet, , Disp: , Rfl:    pantoprazole (PROTONIX) 40 MG tablet, TAKE 1 TABLET(40  MG) BY MOUTH DAILY, Disp: 90 tablet, Rfl: 0   sucralfate (CARAFATE) 1 GM/10ML suspension, Take 10 mLs (1 g total) by mouth with breakfast, with lunch, and with evening meal., Disp: 420 mL, Rfl: 2   Allergies: No Known Allergies  REVIEW OF SYSTEMS:   Review of Systems  Constitutional:  Negative for chills, fatigue and fever.  HENT:   Negative for lump/mass, mouth sores, nosebleeds, sore throat and trouble swallowing.   Eyes:  Negative for eye problems.  Respiratory:  Negative for cough and shortness of breath.   Cardiovascular:  Positive for chest pain (Right chest wall). Negative for leg swelling and palpitations.  Gastrointestinal:  Negative for  abdominal pain, constipation, diarrhea, nausea and vomiting.  Genitourinary:  Negative for bladder incontinence, difficulty urinating, dysuria, frequency, hematuria and nocturia.   Musculoskeletal:  Negative for arthralgias, back pain, flank pain, myalgias and neck pain.  Skin:  Negative for itching and rash.  Neurological:  Positive for dizziness and headaches. Negative for numbness.  Hematological:  Does not bruise/bleed easily.  Psychiatric/Behavioral:  Negative for depression, sleep disturbance and suicidal ideas. The patient is not nervous/anxious.   All other systems reviewed and are negative.    VITALS:   Blood pressure (!) 148/77, pulse 70, temperature 97.9 F (36.6 C), temperature source Tympanic, resp. rate 18, height 5' 1.5" (1.562 m), weight 184 lb 6.4 oz (83.6 kg), SpO2 97 %.  Wt Readings from Last 3 Encounters:  12/26/22 184 lb 6.4 oz (83.6 kg)  12/19/22 184 lb (83.5 kg)  12/05/22 184 lb (83.5 kg)    Body mass index is 34.28 kg/m.  Performance status (ECOG): 1 - Symptomatic but completely ambulatory  PHYSICAL EXAM:   Physical Exam Vitals and nursing note reviewed. Exam conducted with a chaperone present.  Constitutional:      Appearance: Normal appearance.  Cardiovascular:     Rate and Rhythm: Normal rate and regular rhythm.     Pulses: Normal pulses.     Heart sounds: Normal heart sounds.  Pulmonary:     Effort: Pulmonary effort is normal.     Breath sounds: Normal breath sounds.  Abdominal:     Palpations: Abdomen is soft. There is no hepatomegaly, splenomegaly or mass.     Tenderness: There is no abdominal tenderness.  Musculoskeletal:     Right lower leg: No edema.     Left lower leg: No edema.  Lymphadenopathy:     Cervical: No cervical adenopathy.     Right cervical: No superficial, deep or posterior cervical adenopathy.    Left cervical: No superficial, deep or posterior cervical adenopathy.     Upper Body:     Right upper body: No supraclavicular  or axillary adenopathy.     Left upper body: No supraclavicular or axillary adenopathy.  Neurological:     General: No focal deficit present.     Mental Status: She is alert and oriented to person, place, and time.  Psychiatric:        Mood and Affect: Mood normal.        Behavior: Behavior normal.     LABS:      Latest Ref Rng & Units 12/26/2022    9:28 AM 12/19/2022    9:25 AM 09/12/2022   11:20 AM  CBC  WBC 4.0 - 10.5 K/uL 7.3  6.7  7.6   Hemoglobin 12.0 - 15.0 g/dL 16.1  09.6  04.5   Hematocrit 36.0 - 46.0 % 34.8  32.3  34.6  Platelets 150 - 400 K/uL 252  226  269       Latest Ref Rng & Units 12/26/2022    9:28 AM 09/12/2022   11:20 AM 08/15/2022   10:45 AM  CMP  Glucose 70 - 99 mg/dL 098  96  119   BUN 6 - 20 mg/dL 37  46  64   Creatinine 0.44 - 1.00 mg/dL 1.47  82.95  62.13   Sodium 135 - 145 mmol/L 138  138  139   Potassium 3.5 - 5.1 mmol/L 3.9  3.8  3.6   Chloride 98 - 111 mmol/L 95  96  97   CO2 22 - 32 mmol/L 29  27  26    Calcium 8.9 - 10.3 mg/dL 08.6  9.8  9.4   Total Protein 6.5 - 8.1 g/dL 7.7  7.3    Total Bilirubin 0.3 - 1.2 mg/dL 0.7  0.7    Alkaline Phos 38 - 126 U/L 93  90    AST 15 - 41 U/L 14  26    ALT 0 - 44 U/L 21  27       No results found for: "CEA1", "CEA" / No results found for: "CEA1", "CEA" No results found for: "PSA1" No results found for: "VHQ469" No results found for: "CAN125"  No results found for: "TOTALPROTELP", "ALBUMINELP", "A1GS", "A2GS", "BETS", "BETA2SER", "GAMS", "MSPIKE", "SPEI" Lab Results  Component Value Date   TIBC 195 (L) 02/15/2021   TIBC 190 (L) 12/10/2018   TIBC 218 (L) 06/08/2016   FERRITIN 1,742 (H) 02/15/2021   FERRITIN 1,308 (H) 12/10/2018   FERRITIN 126 06/08/2016   IRONPCTSAT 15 02/15/2021   IRONPCTSAT 43 (H) 12/10/2018   IRONPCTSAT 22 06/08/2016   No results found for: "LDH"   STUDIES:   CT Biopsy  Result Date: 12/19/2022 INDICATION: 62 year old woman with history of breast cancer a new right chest  wall FDG avid soft tissue mass presents to IR for imaging guided biopsy. EXAM: CT and ultrasound-guided biopsy of right chest wall mass TECHNIQUE: Multidetector CT imaging of the chest was performed following the standard protocol without IV contrast. RADIATION DOSE REDUCTION: This exam was performed according to the departmental dose-optimization program which includes automated exposure control, adjustment of the mA and/or kV according to patient size and/or use of iterative reconstruction technique. MEDICATIONS: None. ANESTHESIA/SEDATION: Moderate (conscious) sedation was employed during this procedure. A total of Versed 1 mg and Fentanyl 25 mcg was administered intravenously by the radiology nurse. Total intra-service moderate Sedation Time: 13 minutes. The patient's level of consciousness and vital signs were monitored continuously by radiology nursing throughout the procedure under my direct supervision. COMPLICATIONS: None immediate. PROCEDURE: Informed written consent was obtained from the patient after a thorough discussion of the procedural risks, benefits and alternatives. All questions were addressed. Maximal Sterile Barrier Technique was utilized including caps, mask, sterile gowns, sterile gloves, sterile drape, hand hygiene and skin antiseptic. A timeout was performed prior to the initiation of the procedure. Patient position supine on the ultrasound table. Right anterior chest wall skin prepped and draped in usual sterile fashion. Following local lidocaine administration, 17 gauge introducer needle was advanced into the right anterior chest wall lesion. The position of the needle was confirmed with CT. Four 18 gauge cores were obtained from the right anterior chest wall mass utilizing continuous ultrasound guidance. Samples were sent to pathology in formalin. Needle removed and hemostasis achieved with 2 minutes of manual compression. Post procedure ultrasound images showed no  evidence of significant  hemorrhage or pneumothorax. IMPRESSION: Ultrasound and CT-guided biopsy of right anterior chest wall mass. Electronically Signed   By: Acquanetta Belling M.D.   On: 12/19/2022 15:35   CT US GUIDED BIOPSY  Result Date: 12/19/2022 INDICATION: 62 year old woman with history of breast cancer a new right chest wall FDG avid soft tissue mass presents to IR for imaging guided biopsy. EXAM: CT and ultrasound-guided biopsy of right chest wall mass TECHNIQUE: Multidetector CT imaging of the chest was performed following the standard protocol without IV contrast. RADIATION DOSE REDUCTION: This exam was performed according to the departmental dose-optimization program which includes automated exposure control, adjustment of the mA and/or kV according to patient size and/or use of iterative reconstruction technique. MEDICATIONS: None. ANESTHESIA/SEDATION: Moderate (conscious) sedation was employed during this procedure. A total of Versed 1 mg and Fentanyl 25 mcg was administered intravenously by the radiology nurse. Total intra-service moderate Sedation Time: 13 minutes. The patient's level of consciousness and vital signs were monitored continuously by radiology nursing throughout the procedure under my direct supervision. COMPLICATIONS: None immediate. PROCEDURE: Informed written consent was obtained from the patient after a thorough discussion of the procedural risks, benefits and alternatives. All questions were addressed. Maximal Sterile Barrier Technique was utilized including caps, mask, sterile gowns, sterile gloves, sterile drape, hand hygiene and skin antiseptic. A timeout was performed prior to the initiation of the procedure. Patient position supine on the ultrasound table. Right anterior chest wall skin prepped and draped in usual sterile fashion. Following local lidocaine administration, 17 gauge introducer needle was advanced into the right anterior chest wall lesion. The position of the needle was confirmed with CT.  Four 18 gauge cores were obtained from the right anterior chest wall mass utilizing continuous ultrasound guidance. Samples were sent to pathology in formalin. Needle removed and hemostasis achieved with 2 minutes of manual compression. Post procedure ultrasound images showed no evidence of significant hemorrhage or pneumothorax. IMPRESSION: Ultrasound and CT-guided biopsy of right anterior chest wall mass. Electronically Signed   By: Acquanetta Belling M.D.   On: 12/19/2022 15:35   NM PET Image Restag (PS) Skull Base To Thigh  Result Date: 12/04/2022 CLINICAL DATA:  Subsequent treatment strategy for right-sided breast cancer. Restaging. Bilateral mastectomy 5 years ago. Chemotherapy 4 years ago. Radiation therapy 2 years ago. Right axillary pain. EXAM: NUCLEAR MEDICINE PET SKULL BASE TO THIGH TECHNIQUE: 9.1 mCi F-18 FDG was injected intravenously. Full-ring PET imaging was performed from the skull base to thigh after the radiotracer. CT data was obtained and used for attenuation correction and anatomic localization. Fasting blood glucose: 136 mg/dl COMPARISON:  78/29/5621 abdominopelvic CT. 09/06/2021 bone scan. Most recent PET 09/14/2020 FINDINGS: Mediastinal blood pool activity: SUV max 2.6 Liver activity: SUV max NA NECK: No areas of abnormal hypermetabolism. Incidental CT findings: No cervical adenopathy. Left carotid atherosclerosis. CHEST: No pulmonary parenchymal or thoracic nodal hypermetabolism. Incidental CT findings: Right axillary node dissection. No axillary or subpectoral adenopathy. Tiny hiatal hernia. Mild cardiomegaly. Aortic and coronary artery calcification. Right apical radiation change. Vague right lower lobe 5 mm nodule and 97/3 is similar to the prior, favoring a benign etiology. Tiny left apical pulmonary nodules x2 including on 62 and 65 of series 3 are unchanged and presumed benign. ABDOMEN/PELVIS: No abdominopelvic parenchymal or nodal hypermetabolism. Incidental CT findings:  Cholecystectomy. Normal left adrenal gland. An 8 mm right suprarenal nodule is favored to arise from the adrenal gland and is not tracer avid on 136/3. Not readily apparent  on the prior. Moderate bilateral renal cortical thinning. A right renal mildly hypoattenuating 7 mm lesion is similar to on the prior, favoring a hemorrhagic/proteinaceous cyst. Bilateral renal collecting system calculi. Abdominal aortic atherosclerosis. Apparent colonic wall thickening involving the ascending and transverse segments, favored to be due to underdistention. Bilateral internal iliac stents.  Hysterectomy. SKELETON: Status post bilateral mastectomy. About the medial right pectoralis musculature is hypermetabolism corresponding to subtle soft tissue fullness. Example at a S.U.V. max of 13.497/3. No abnormal marrow activity. Incidental CT findings: No acute osseous abnormality. IMPRESSION: 1. Recurrent/metastatic disease within the right medial pectoralis musculature in this patient who is status post bilateral mastectomy. 2. A right suprarenal 8 mm nodule is likely arising from the adrenal gland, not readily apparent on the prior, but not tracer avid. Most likely an incidental benign lesion. Recommend attention on follow-up. 3. Incidental findings, including: Coronary artery atherosclerosis. Aortic Atherosclerosis (ICD10-I70.0). Moderate bilateral renal atrophy. Nephrolithiasis. Electronically Signed   By: Jeronimo Greaves M.D.   On: 12/04/2022 10:54

## 2022-12-26 ENCOUNTER — Inpatient Hospital Stay (HOSPITAL_BASED_OUTPATIENT_CLINIC_OR_DEPARTMENT_OTHER): Payer: Medicare Other | Admitting: Hematology

## 2022-12-26 ENCOUNTER — Inpatient Hospital Stay: Payer: Medicare Other | Attending: Hematology

## 2022-12-26 VITALS — BP 148/77 | HR 70 | Temp 97.9°F | Resp 18 | Ht 61.5 in | Wt 184.4 lb

## 2022-12-26 DIAGNOSIS — Z79899 Other long term (current) drug therapy: Secondary | ICD-10-CM | POA: Insufficient documentation

## 2022-12-26 DIAGNOSIS — Z9013 Acquired absence of bilateral breasts and nipples: Secondary | ICD-10-CM | POA: Insufficient documentation

## 2022-12-26 DIAGNOSIS — G629 Polyneuropathy, unspecified: Secondary | ICD-10-CM | POA: Diagnosis not present

## 2022-12-26 DIAGNOSIS — C50811 Malignant neoplasm of overlapping sites of right female breast: Secondary | ICD-10-CM | POA: Diagnosis not present

## 2022-12-26 DIAGNOSIS — M8580 Other specified disorders of bone density and structure, unspecified site: Secondary | ICD-10-CM | POA: Diagnosis not present

## 2022-12-26 DIAGNOSIS — C799 Secondary malignant neoplasm of unspecified site: Secondary | ICD-10-CM

## 2022-12-26 DIAGNOSIS — Z170 Estrogen receptor positive status [ER+]: Secondary | ICD-10-CM | POA: Insufficient documentation

## 2022-12-26 DIAGNOSIS — Z79811 Long term (current) use of aromatase inhibitors: Secondary | ICD-10-CM | POA: Insufficient documentation

## 2022-12-26 DIAGNOSIS — E559 Vitamin D deficiency, unspecified: Secondary | ICD-10-CM | POA: Insufficient documentation

## 2022-12-26 DIAGNOSIS — N186 End stage renal disease: Secondary | ICD-10-CM | POA: Diagnosis not present

## 2022-12-26 DIAGNOSIS — Z992 Dependence on renal dialysis: Secondary | ICD-10-CM | POA: Diagnosis not present

## 2022-12-26 LAB — CBC WITH DIFFERENTIAL/PLATELET
Abs Immature Granulocytes: 0.02 10*3/uL (ref 0.00–0.07)
Basophils Absolute: 0.1 10*3/uL (ref 0.0–0.1)
Basophils Relative: 1 %
Eosinophils Absolute: 0.2 10*3/uL (ref 0.0–0.5)
Eosinophils Relative: 3 %
HCT: 34.8 % — ABNORMAL LOW (ref 36.0–46.0)
Hemoglobin: 11.2 g/dL — ABNORMAL LOW (ref 12.0–15.0)
Immature Granulocytes: 0 %
Lymphocytes Relative: 24 %
Lymphs Abs: 1.8 10*3/uL (ref 0.7–4.0)
MCH: 34.3 pg — ABNORMAL HIGH (ref 26.0–34.0)
MCHC: 32.2 g/dL (ref 30.0–36.0)
MCV: 106.4 fL — ABNORMAL HIGH (ref 80.0–100.0)
Monocytes Absolute: 0.9 10*3/uL (ref 0.1–1.0)
Monocytes Relative: 12 %
Neutro Abs: 4.4 10*3/uL (ref 1.7–7.7)
Neutrophils Relative %: 60 %
Platelets: 252 10*3/uL (ref 150–400)
RBC: 3.27 MIL/uL — ABNORMAL LOW (ref 3.87–5.11)
RDW: 14.9 % (ref 11.5–15.5)
WBC: 7.3 10*3/uL (ref 4.0–10.5)
nRBC: 0 % (ref 0.0–0.2)

## 2022-12-26 LAB — COMPREHENSIVE METABOLIC PANEL
ALT: 21 U/L (ref 0–44)
AST: 14 U/L — ABNORMAL LOW (ref 15–41)
Albumin: 3.7 g/dL (ref 3.5–5.0)
Alkaline Phosphatase: 93 U/L (ref 38–126)
Anion gap: 14 (ref 5–15)
BUN: 37 mg/dL — ABNORMAL HIGH (ref 6–20)
CO2: 29 mmol/L (ref 22–32)
Calcium: 10 mg/dL (ref 8.9–10.3)
Chloride: 95 mmol/L — ABNORMAL LOW (ref 98–111)
Creatinine, Ser: 9.74 mg/dL — ABNORMAL HIGH (ref 0.44–1.00)
GFR, Estimated: 4 mL/min — ABNORMAL LOW (ref >60)
Glucose, Bld: 116 mg/dL — ABNORMAL HIGH (ref 70–99)
Potassium: 3.9 mmol/L (ref 3.5–5.1)
Sodium: 138 mmol/L (ref 135–145)
Total Bilirubin: 0.7 mg/dL (ref 0.3–1.2)
Total Protein: 7.7 g/dL (ref 6.5–8.1)

## 2022-12-26 LAB — VITAMIN D 25 HYDROXY (VIT D DEFICIENCY, FRACTURES): Vit D, 25-Hydroxy: 11.97 ng/mL — ABNORMAL LOW (ref 30–100)

## 2022-12-26 NOTE — Patient Instructions (Addendum)
[facility] at [facility] Discharge Instructions   You were seen and examined today by Dr. Ellin Saba.  He reviewed the results of your biopsy that is consistent with recurrence of the breast cancer you initially had.   Dr. Kirtland Bouchard recommends you see Dr. Lovell Sheehan to see if he can remove the remaining cancer at the mastectomy site. If this is not an option, then the next best option is radiation treatment to this area.   We will arrange for you to have an MRI of the brain given your frequent headaches.   Return as scheduled.    Thank you for choosing [facility] at [facility] to provide your oncology and hematology care.  To afford each patient quality time with our provider, please arrive at least 15 minutes before your scheduled appointment time.   If you have a lab appointment with the Cancer Center please come in thru the Main Entrance and check in at the main information desk.  You need to re-schedule your appointment should you arrive 10 or more minutes late.  We strive to give you quality time with our providers, and arriving late affects you and other patients whose appointments are after yours.  Also, if you no show three or more times for appointments you may be dismissed from the clinic at the providers discretion.     Again, thank you for choosing [facility].  Our hope is that these requests will decrease the amount of time that you wait before being seen by our physicians.       _____________________________________________________________  Should you have questions after your visit to [facility], please contact our office at [phone] and follow the prompts.  Our office hours are 8:00 a.m. and 4:30 p.m. Monday - Friday.  Please note that voicemails left after 4:00 p.m. may not be returned until the following business day.  We are closed weekends and major holidays.  You do have access to a nurse 24-7, just  call the main number to the clinic [phone] and do not press any options, hold on the line and a nurse will answer the phone.    For prescription refill requests, have your pharmacy contact our office and allow 72 hours.    Due to Covid, you will need to wear a mask upon entering the hospital. If you do not have a mask, a mask will be given to you at the Main Entrance upon arrival. For doctor visits, patients may have 1 support person age 16 or older with them. For treatment visits, patients can not have anyone with them due to social distancing guidelines and our immunocompromised population.

## 2022-12-27 ENCOUNTER — Telehealth: Payer: Self-pay | Admitting: Pharmacist

## 2022-12-27 DIAGNOSIS — I159 Secondary hypertension, unspecified: Secondary | ICD-10-CM | POA: Diagnosis not present

## 2022-12-27 DIAGNOSIS — E1129 Type 2 diabetes mellitus with other diabetic kidney complication: Secondary | ICD-10-CM | POA: Diagnosis not present

## 2022-12-27 DIAGNOSIS — Z992 Dependence on renal dialysis: Secondary | ICD-10-CM | POA: Diagnosis not present

## 2022-12-27 DIAGNOSIS — E119 Type 2 diabetes mellitus without complications: Secondary | ICD-10-CM | POA: Diagnosis not present

## 2022-12-27 DIAGNOSIS — D631 Anemia in chronic kidney disease: Secondary | ICD-10-CM | POA: Diagnosis not present

## 2022-12-27 DIAGNOSIS — N2581 Secondary hyperparathyroidism of renal origin: Secondary | ICD-10-CM | POA: Diagnosis not present

## 2022-12-27 DIAGNOSIS — N186 End stage renal disease: Secondary | ICD-10-CM | POA: Diagnosis not present

## 2022-12-27 DIAGNOSIS — E039 Hypothyroidism, unspecified: Secondary | ICD-10-CM | POA: Diagnosis not present

## 2022-12-27 DIAGNOSIS — D509 Iron deficiency anemia, unspecified: Secondary | ICD-10-CM | POA: Diagnosis not present

## 2022-12-27 LAB — CANCER ANTIGEN 15-3: CA 15-3: 26 U/mL — ABNORMAL HIGH (ref 0.0–25.0)

## 2022-12-27 NOTE — Progress Notes (Signed)
Care Management & Coordination Services Pharmacy Team  Reason for Encounter: CHF update   Contacted patient for general health update and medication adherence call.  Spoke with patient on 12/27/2022    Are you having any of the following symptoms:   Shortness of breath with activity or when lying down? no   Fatigue and weakness? yes   Swelling in the legs, ankles and feet? no   Rapid or irregular heartbeat? no   Reduced ability to exercise? no   Wheezing? no   A cough that doesn't go away or a cough that brings up white or pink mucus with spots of blood? no   Swelling of the belly area? no   Very rapid weight gain from fluid buildup? Patient states her weight is up and down.   Nausea and lack of appetite? no   Difficulty concentrating or decreased alertness? no    Chart Updates:  Recent office visits:  11/09/2022 OV (Fam Med) Park Meo, FNP;  Encouraged bland diet and will order Zofran 4mg  q88h PRN for nausea.   10/20/2022 OV (PCP) Donita Brooks, MD; We will discontinue hydrocodone. Instead I will give her Percocet 5/325 1 p.o. every 4 hours as needed for pain   Recent consult visits:  12/26/2022 OV (Oncology) [name], MD; Will consider switching anastrozole to either Faslodex or adding CDK 4 inhibitor if she cannot get surgery or radiation.  12/05/2022 OV (Oncology) [name], MD; no medication changes indicated.  11/21/2022 OV (Oncology) [name], MD; no medication changes indicated.  11/07/2022 OV (Oncology) [name], MD; no medication changes indicated.  Hospital visits:  None since last care coordination call  Medications: Outpatient Encounter Medications as of 12/27/2022  Medication Sig   amLODipine (NORVASC) 10 MG tablet TAKE 1 TABLET BY MOUTH AT BEDTIME   anastrozole (ARIMIDEX) 1 MG tablet TAKE 1 TABLET(1 MG) BY MOUTH DAILY   aspirin EC 81 MG tablet Take 81 mg by mouth daily.   atorvastatin (LIPITOR)  20 MG tablet Take 1 tablet (20 mg total) by mouth daily. TAKE 1 TABLET DAILY   B Complex-C-Zn-Folic Acid (DIALYVITE 800-ZINC 15) 0.8 MG TABS Take 1 tablet by mouth daily.   cinacalcet (SENSIPAR) 30 MG tablet Take 2 tablets (60 mg total) by mouth daily. (Patient taking differently: Take 30 mg by mouth daily.)   Darbepoetin Alfa (ARANESP, ALBUMIN FREE, IJ) Darbepoetin Alfa (Aranesp)   fluticasone (FLONASE) 50 MCG/ACT nasal spray Place 2 sprays into both nostrils daily.   gabapentin (NEURONTIN) 300 MG capsule TAKE 1 CAPSULE(300 MG) BY MOUTH AT BEDTIME   isosorbide mononitrate (IMDUR) 30 MG 24 hr tablet Take 1.5 tablets (45 mg total) by mouth daily.   lanthanum (FOSRENOL) 1000 MG chewable tablet Chew 2,000-3,000 mg by mouth See admin instructions. Take 3 tablets (3000 mg) by mouth with meals and take 2 tablets (2000 mg) with snacks   lidocaine-prilocaine (EMLA) cream Apply 1 application  topically every Monday, Wednesday, and Friday with hemodialysis.   meclizine (ANTIVERT) 25 MG tablet TAKE 1 TABLET(25 MG) BY MOUTH THREE TIMES DAILY AS NEEDED FOR DIZZINESS   nebivolol (BYSTOLIC) 10 MG tablet Take 1 tablet (10 mg total) by mouth in the morning and at bedtime.   nitroGLYCERIN (NITROSTAT) 0.4 MG SL tablet Place 1 tablet (0.4 mg total) under the tongue every 5 (five) minutes x 3 doses as needed for chest pain (if no relief after 3rd dose, proceed to ED or call 911).   ondansetron (ZOFRAN) 4 MG tablet  Take 1 tablet (4 mg total) by mouth every 8 (eight) hours as needed for nausea or vomiting.   oxyCODONE-acetaminophen (PERCOCET/ROXICET) 5-325 MG tablet    pantoprazole (PROTONIX) 40 MG tablet TAKE 1 TABLET(40 MG) BY MOUTH DAILY   sucralfate (CARAFATE) 1 GM/10ML suspension Take 10 mLs (1 g total) by mouth with breakfast, with lunch, and with evening meal.   No facility-administered encounter medications on file as of 12/27/2022.    Recent vitals BP Readings from Last 3 Encounters:  12/26/22 (!) 148/77   12/19/22 (!) 162/84  12/05/22 (!) 149/80   Pulse Readings from Last 3 Encounters:  12/26/22 70  12/19/22 70  12/05/22 72   Wt Readings from Last 3 Encounters:  12/26/22 184 lb 6.4 oz (83.6 kg)  12/19/22 184 lb (83.5 kg)  12/05/22 184 lb (83.5 kg)   BMI Readings from Last 3 Encounters:  12/26/22 34.28 kg/m  12/19/22 34.77 kg/m  12/05/22 34.77 kg/m    Recent lab results    Component Value Date/Time   NA 138 12/26/2022 0928   K 3.9 12/26/2022 0928   CL 95 (L) 12/26/2022 0928   CO2 29 12/26/2022 0928   GLUCOSE 116 (H) 12/26/2022 0928   BUN 37 (H) 12/26/2022 0928   CREATININE 9.74 (H) 12/26/2022 0928   CREATININE 6.69 (H) 08/26/2021 1228   CALCIUM 10.0 12/26/2022 0928    Lab Results  Component Value Date   CREATININE 9.74 (H) 12/26/2022   EGFR 7 (L) 08/26/2021   GFRNONAA 4 (L) 12/26/2022   GFRAA 5 (L) 11/25/2020   Lab Results  Component Value Date/Time   HGBA1C 5.5 11/25/2020 09:40 AM   HGBA1C 5.8 (H) 02/03/2019 04:43 AM   MICROALBUR 67.39 (H) 03/17/2014 03:57 PM    Lab Results  Component Value Date   CHOL 141 11/25/2020   HDL 55 11/25/2020   LDLCALC 72 11/25/2020   LDLDIRECT 105 (H) 03/02/2022   TRIG 67 11/25/2020   CHOLHDL 2.6 11/25/2020    Care Gaps: Annual wellness visit in last year? Yes  If Diabetic: Last eye exam / retinopathy screening: 09/14/2014 Last diabetic foot exam: 08/27/2015  Star Rating Drugs:  Atorvastatin 20 mg last filled 12/09/2022 90 DS   Future Appointments  Date Time Provider Department Center  01/02/2023 12:45 PM Franky Macho, MD [facility] None  01/11/2023 11:00 AM [facility]  01/16/2023  8:00 AM [name], MD [facility] None  02/28/2023  3:20 PM Antoine Poche, MD [facility]  04/12/2023 12:00 PM Erroll Luna, [facility] None  09/20/2023 11:30 AM [facility]-ANNUAL WELLNESS VISIT [facility]   April D Calhoun, [facility] Clinical Pharmacist Assistant [phone]

## 2022-12-29 DIAGNOSIS — N186 End stage renal disease: Secondary | ICD-10-CM | POA: Diagnosis not present

## 2022-12-29 DIAGNOSIS — D631 Anemia in chronic kidney disease: Secondary | ICD-10-CM | POA: Diagnosis not present

## 2022-12-29 DIAGNOSIS — N2581 Secondary hyperparathyroidism of renal origin: Secondary | ICD-10-CM | POA: Diagnosis not present

## 2022-12-29 DIAGNOSIS — D509 Iron deficiency anemia, unspecified: Secondary | ICD-10-CM | POA: Diagnosis not present

## 2022-12-29 DIAGNOSIS — I159 Secondary hypertension, unspecified: Secondary | ICD-10-CM | POA: Diagnosis not present

## 2022-12-29 DIAGNOSIS — Z992 Dependence on renal dialysis: Secondary | ICD-10-CM | POA: Diagnosis not present

## 2023-01-01 DIAGNOSIS — D631 Anemia in chronic kidney disease: Secondary | ICD-10-CM | POA: Diagnosis not present

## 2023-01-01 DIAGNOSIS — D509 Iron deficiency anemia, unspecified: Secondary | ICD-10-CM | POA: Diagnosis not present

## 2023-01-01 DIAGNOSIS — Z992 Dependence on renal dialysis: Secondary | ICD-10-CM | POA: Diagnosis not present

## 2023-01-01 DIAGNOSIS — N186 End stage renal disease: Secondary | ICD-10-CM | POA: Diagnosis not present

## 2023-01-01 DIAGNOSIS — I159 Secondary hypertension, unspecified: Secondary | ICD-10-CM | POA: Diagnosis not present

## 2023-01-01 DIAGNOSIS — N2581 Secondary hyperparathyroidism of renal origin: Secondary | ICD-10-CM | POA: Diagnosis not present

## 2023-01-02 ENCOUNTER — Ambulatory Visit (INDEPENDENT_AMBULATORY_CARE_PROVIDER_SITE_OTHER): Payer: Medicare Other | Admitting: General Surgery

## 2023-01-02 ENCOUNTER — Encounter: Payer: Self-pay | Admitting: General Surgery

## 2023-01-02 VITALS — BP 149/79 | HR 81 | Temp 98.1°F | Resp 12 | Ht 61.5 in | Wt 187.0 lb

## 2023-01-02 DIAGNOSIS — C50911 Malignant neoplasm of unspecified site of right female breast: Secondary | ICD-10-CM

## 2023-01-03 DIAGNOSIS — Z992 Dependence on renal dialysis: Secondary | ICD-10-CM | POA: Diagnosis not present

## 2023-01-03 DIAGNOSIS — D509 Iron deficiency anemia, unspecified: Secondary | ICD-10-CM | POA: Diagnosis not present

## 2023-01-03 DIAGNOSIS — N186 End stage renal disease: Secondary | ICD-10-CM | POA: Diagnosis not present

## 2023-01-03 DIAGNOSIS — N2581 Secondary hyperparathyroidism of renal origin: Secondary | ICD-10-CM | POA: Diagnosis not present

## 2023-01-03 DIAGNOSIS — I159 Secondary hypertension, unspecified: Secondary | ICD-10-CM | POA: Diagnosis not present

## 2023-01-03 DIAGNOSIS — D631 Anemia in chronic kidney disease: Secondary | ICD-10-CM | POA: Diagnosis not present

## 2023-01-03 NOTE — Progress Notes (Signed)
Subjective:     Megan [name]  Patient is a 62 year old black female who has been sent back to my care for a right subcutaneous mass along the chest wall, status post right mastectomy for breast cancer in the past.  This is biopsy-proven to be recurrent breast cancer.  Dr. Ellin Saba of oncology has requested excision of the mass.  She is also being seen by radiation oncology. Objective:    BP (!) 149/79   Pulse 81   Temp 98.1 F (36.7 C) (Oral)   Resp 12   Ht 5' 1.5" (1.562 m)   Wt 187 lb (84.8 kg)   SpO2 94%   BMI 34.76 kg/m   General:  alert, cooperative, and no distress  Head is normocephalic, atraumatic Lungs clear to auscultation with equal breath sounds bilaterally Heart examination reveals regular rate and rhythm without S3, S4, murmurs Chest: A hard mobile subcutaneous 2 to 3 cm mass is noted just superior to the mastectomy surgical scar centrally.  This coordinates with the positive finding on PET scan.  This does not appear to be attached to the chest wall.  PET scan images personally reviewed Pathology report reviewed     Assessment:    Recurrent right breast cancer    Plan:   Patient is scheduled for excision of the malignant subcutaneous nodule on the chest wall on 01/18/2023.  The risks and benefits of the procedure including bleeding, infection, and wound breakdown were fully explained to the patient, who gave informed consent.

## 2023-01-04 ENCOUNTER — Ambulatory Visit: Payer: Medicare Other | Admitting: General Surgery

## 2023-01-04 NOTE — H&P (Signed)
Subjective:     Megan [name]  Patient is a 61-year-old black female who has been sent back to my care for a right subcutaneous mass along the chest wall, status post right mastectomy for breast cancer in the past.  This is biopsy-proven to be recurrent breast cancer.  Dr. Katragadda of oncology has requested excision of the mass.  She is also being seen by radiation oncology. Objective:    BP (!) 149/79   Pulse 81   Temp 98.1 F (36.7 C) (Oral)   Resp 12   Ht 5' 1.5" (1.562 m)   Wt 187 lb (84.8 kg)   SpO2 94%   BMI 34.76 kg/m   General:  alert, cooperative, and no distress  Head is normocephalic, atraumatic Lungs clear to auscultation with equal breath sounds bilaterally Heart examination reveals regular rate and rhythm without S3, S4, murmurs Chest: A hard mobile subcutaneous 2 to 3 cm mass is noted just superior to the mastectomy surgical scar centrally.  This coordinates with the positive finding on PET scan.  This does not appear to be attached to the chest wall.  PET scan images personally reviewed Pathology report reviewed     Assessment:    Recurrent right breast cancer    Plan:   Patient is scheduled for excision of the malignant subcutaneous nodule on the chest wall on 01/18/2023.  The risks and benefits of the procedure including bleeding, infection, and wound breakdown were fully explained to the patient, who gave informed consent. 

## 2023-01-05 DIAGNOSIS — N2581 Secondary hyperparathyroidism of renal origin: Secondary | ICD-10-CM | POA: Diagnosis not present

## 2023-01-05 DIAGNOSIS — D509 Iron deficiency anemia, unspecified: Secondary | ICD-10-CM | POA: Diagnosis not present

## 2023-01-05 DIAGNOSIS — I159 Secondary hypertension, unspecified: Secondary | ICD-10-CM | POA: Diagnosis not present

## 2023-01-05 DIAGNOSIS — N186 End stage renal disease: Secondary | ICD-10-CM | POA: Diagnosis not present

## 2023-01-05 DIAGNOSIS — D631 Anemia in chronic kidney disease: Secondary | ICD-10-CM | POA: Diagnosis not present

## 2023-01-05 DIAGNOSIS — Z992 Dependence on renal dialysis: Secondary | ICD-10-CM | POA: Diagnosis not present

## 2023-01-08 DIAGNOSIS — Z992 Dependence on renal dialysis: Secondary | ICD-10-CM | POA: Diagnosis not present

## 2023-01-08 DIAGNOSIS — I159 Secondary hypertension, unspecified: Secondary | ICD-10-CM | POA: Diagnosis not present

## 2023-01-08 DIAGNOSIS — N2581 Secondary hyperparathyroidism of renal origin: Secondary | ICD-10-CM | POA: Diagnosis not present

## 2023-01-08 DIAGNOSIS — D509 Iron deficiency anemia, unspecified: Secondary | ICD-10-CM | POA: Diagnosis not present

## 2023-01-08 DIAGNOSIS — D631 Anemia in chronic kidney disease: Secondary | ICD-10-CM | POA: Diagnosis not present

## 2023-01-08 DIAGNOSIS — N186 End stage renal disease: Secondary | ICD-10-CM | POA: Diagnosis not present

## 2023-01-09 ENCOUNTER — Inpatient Hospital Stay: Payer: Medicare Other

## 2023-01-10 DIAGNOSIS — D631 Anemia in chronic kidney disease: Secondary | ICD-10-CM | POA: Diagnosis not present

## 2023-01-10 DIAGNOSIS — N2581 Secondary hyperparathyroidism of renal origin: Secondary | ICD-10-CM | POA: Diagnosis not present

## 2023-01-10 DIAGNOSIS — D509 Iron deficiency anemia, unspecified: Secondary | ICD-10-CM | POA: Diagnosis not present

## 2023-01-10 DIAGNOSIS — I159 Secondary hypertension, unspecified: Secondary | ICD-10-CM | POA: Diagnosis not present

## 2023-01-10 DIAGNOSIS — Z992 Dependence on renal dialysis: Secondary | ICD-10-CM | POA: Diagnosis not present

## 2023-01-10 DIAGNOSIS — N186 End stage renal disease: Secondary | ICD-10-CM | POA: Diagnosis not present

## 2023-01-11 ENCOUNTER — Ambulatory Visit (HOSPITAL_BASED_OUTPATIENT_CLINIC_OR_DEPARTMENT_OTHER)
Admission: RE | Admit: 2023-01-11 | Discharge: 2023-01-11 | Disposition: A | Discharge status: Home / Self Care | Payer: Medicare Other | Source: Ambulatory Visit | Attending: Hematology | Admitting: Hematology

## 2023-01-11 DIAGNOSIS — C799 Secondary malignant neoplasm of unspecified site: Secondary | ICD-10-CM | POA: Diagnosis not present

## 2023-01-11 DIAGNOSIS — R519 Headache, unspecified: Secondary | ICD-10-CM | POA: Diagnosis not present

## 2023-01-11 MED ORDER — GADOBUTROL 1 MMOL/ML IV SOLN
10.0000 mL | Freq: Once | INTRAVENOUS | Status: AC | PRN
  Administered 2023-01-11: 10 mL via INTRAVENOUS
  Filled 2023-01-11: qty 10

## 2023-01-12 ENCOUNTER — Other Ambulatory Visit: Payer: Self-pay | Admitting: Family Medicine

## 2023-01-12 DIAGNOSIS — D509 Iron deficiency anemia, unspecified: Secondary | ICD-10-CM | POA: Diagnosis not present

## 2023-01-12 DIAGNOSIS — N2581 Secondary hyperparathyroidism of renal origin: Secondary | ICD-10-CM | POA: Diagnosis not present

## 2023-01-12 DIAGNOSIS — Z992 Dependence on renal dialysis: Secondary | ICD-10-CM | POA: Diagnosis not present

## 2023-01-12 DIAGNOSIS — D631 Anemia in chronic kidney disease: Secondary | ICD-10-CM | POA: Diagnosis not present

## 2023-01-12 DIAGNOSIS — I159 Secondary hypertension, unspecified: Secondary | ICD-10-CM | POA: Diagnosis not present

## 2023-01-12 DIAGNOSIS — N186 End stage renal disease: Secondary | ICD-10-CM | POA: Diagnosis not present

## 2023-01-12 NOTE — Telephone Encounter (Signed)
Requested Prescriptions  Pending Prescriptions Disp Refills   pantoprazole (PROTONIX) 40 MG tablet [Pharmacy Med Name: PANTOPRAZOLE 40MG  TABLETS] 90 tablet 0    Sig: TAKE 1 TABLET(40 MG) BY MOUTH DAILY     Gastroenterology: Proton Pump Inhibitors Failed - 01/12/2023  6:22 AM      Failed - Valid encounter within last 12 months    Recent Outpatient Visits           1 year ago Medication care plan discussed with patient   [facility] Donita Brooks, MD   1 year ago Dyspnea, unspecified type   [facility] Donita Brooks, MD   1 year ago Clostridium difficile colitis   [facility] Donita Brooks, MD   1 year ago Clostridium difficile colitis   [facility] Donita Brooks, MD   2 years ago Clostridium difficile colitis   [facility] Pickard, Priscille Heidelberg, MD       Future Appointments             In 4 days [name], MD [facility]   In 1 month Branch, [name], MD [facility] at [place], [place]

## 2023-01-13 DIAGNOSIS — Z992 Dependence on renal dialysis: Secondary | ICD-10-CM | POA: Diagnosis not present

## 2023-01-13 DIAGNOSIS — N040 Nephrotic syndrome with minor glomerular abnormality: Secondary | ICD-10-CM | POA: Diagnosis not present

## 2023-01-13 DIAGNOSIS — N186 End stage renal disease: Secondary | ICD-10-CM | POA: Diagnosis not present

## 2023-01-15 ENCOUNTER — Other Ambulatory Visit (HOSPITAL_COMMUNITY): Payer: Medicare Other

## 2023-01-15 DIAGNOSIS — N2581 Secondary hyperparathyroidism of renal origin: Secondary | ICD-10-CM | POA: Diagnosis not present

## 2023-01-15 DIAGNOSIS — E1129 Type 2 diabetes mellitus with other diabetic kidney complication: Secondary | ICD-10-CM | POA: Diagnosis not present

## 2023-01-15 DIAGNOSIS — N186 End stage renal disease: Secondary | ICD-10-CM | POA: Diagnosis not present

## 2023-01-15 DIAGNOSIS — Z992 Dependence on renal dialysis: Secondary | ICD-10-CM | POA: Diagnosis not present

## 2023-01-15 DIAGNOSIS — D509 Iron deficiency anemia, unspecified: Secondary | ICD-10-CM | POA: Diagnosis not present

## 2023-01-15 DIAGNOSIS — E119 Type 2 diabetes mellitus without complications: Secondary | ICD-10-CM | POA: Diagnosis not present

## 2023-01-15 DIAGNOSIS — D631 Anemia in chronic kidney disease: Secondary | ICD-10-CM | POA: Diagnosis not present

## 2023-01-15 DIAGNOSIS — I159 Secondary hypertension, unspecified: Secondary | ICD-10-CM | POA: Diagnosis not present

## 2023-01-15 NOTE — Progress Notes (Signed)
[facility] [address], [place] [address]    Clinic Day:  01/16/2023  Referring physician: Donita Brooks, MD  Patient Care Team: Donita Brooks, MD as PCP - General (Family Medicine) [name] [name], MD as PCP - Cardiology (Cardiology) [name], MD (Gastroenterology) [name], MD as Consulting Physician (Cardiology) [name], MD as Medical Oncologist (Medical Oncology) [name], MD as Consulting Physician (Nephrology)   ASSESSMENT & PLAN:   Assessment: 1.  Stage Ib (T1CN0) right breast cancer, ER positive, PR and HER-2 negative: -Right lumpectomy and sentinel lymph node biopsy on 09/18/2018 shows 1.5 cm IDC, grade 3, associated with high-grade DCIS, negative margins, 0/3 lymph nodes positive, ER 50%, PR negative and HER-2 negative, Ki-67 60%. -Oncotype DX recurrence score 47.  Distant recurrence at 9 years with tamoxifen alone is 36%.  Absolute chemotherapy benefit is more than 15%. -Adjuvant chemotherapy with 4 cycles of AC from 10/30/2018 through 01/01/2019. -She was started on anastrozole in June 2020. -Mammogram on 09/02/2019 showed calcifications in the posterior aspect of upper outer quadrant of the right breast. -Right partial mastectomy on 02/18/2020 shows high-grade DCIS, 1.5 cm, no invasive carcinoma, resection margins negative.  0/11 lymph nodes.  ER 30% positive, PR negative. -Left simple mastectomy on 06/15/2020 was benign.  Right breast biopsy shows microscopic focus of invasive ductal carcinoma in the background of extensive high-grade DCIS. -Right breast lumpectomy on 07/13/2020 shows multifocal invasive ductal carcinoma, grade 3, largest measuring 1.2 cm.  Extensive high-grade DCIS with necrosis.  Margins are free.  PT1CNX.  There are multiple foci of invasive carcinoma arising from extensive DCIS.  Several small foci of invasive tumor arising from DCIS indicating new primaries.  ER/PR/HER-2 is negative.   Ki-67 is 20%. -PET scan on 09/14/2020 shows areas of nodularity with discrete nodule in the fat of the inferior chest wall within the subcutaneous tissues. -Ultrasound of the right chest wall shows masslike abnormality in the upper inner aspect of the right anterior chest at 1 o'clock position measuring 4.1 cm in greatest dimension.  1.7 cm hypoechoic mass in the central anterior right chest concerning for malignancy.  Possible borderline enlarged residual right axillary lymph node. -Right breast biopsy on 10/12/2020 shows 1.3 cm invasive ductal carcinoma, focally 0.1 cm from superior margin.  DCIS is less than 0.1 cm from superior margin.  PT1CPNX. - Right mastectomy followed by radiation therapy completed in May 2022. - PET scan (11/30/2022): Recurrent/metastatic disease within the right medial pectoralis musculature. - Biopsy (12/19/2022): Metastatic breast adenocarcinoma.  ER positive, 40%, weak staining.  PR negative.  Ki-67 30%.  HER2 IHC 0.   2.  Osteopenia: -Bone density on 01/20/2019 shows T score -1.9. - DEXA scan (03/24/2021) T score -2.4    Plan: 1.  Recurrent right breast cancer, triple receptor negative: - PET scan on 11/30/2022: Recurrent disease in the right medial pectoralis musculature with SUV 13.9.  No other metastatic disease. - She complained of bandlike frontal headaches.  We have done MRI of the brain on 01/11/2023: No evidence of intracranial metastatic disease. - She is scheduled to have right chest wall mass resected by Dr. Lovell Sheehan on 01/18/2023. - Continue anastrozole.  She may take half tablet of Percocet 5/325 if pain is intense. - RTC 1 month after surgery to discuss results.  We will see if we need to change anastrozole to Faslodex based on pathology at that time.   2.  Osteopenia/vitamin D deficiency: - She  is taking Nephro-Vite.  Vitamin D is low at 11.9.  She will talk to Dr. Arrie Aran.   3.  ESRD on HD: - Continue HD on Monday/Wednesday/Friday under the direction of  Dr. Arrie Aran.   4.  Neuropathy: - Continue gabapentin at bedtime.    No orders of the defined types were placed in this encounter.     I,Katie Daubenspeck,acting as a Neurosurgeon for [name], MD.,have documented all relevant documentation on the behalf of [name], MD,as directed by  [name], MD while in the presence of [name], MD.   I, [name] MD, have reviewed the above documentation for accuracy and completeness, and I agree with the above.   [name], MD   6/4/202412:17 PM  CHIEF COMPLAINT:   Diagnosis: recurrent right breast cancer    Cancer Staging  Malignant neoplasm of upper-inner quadrant of right female breast [facility]) Staging form: Breast, AJCC 8th Edition - Clinical stage from 09/26/2018: Stage IB (cT1c, cN0, cM0, G3, ER+, PR-, HER2-) - Signed by [name], MD on 09/26/2018    Prior Therapy: 1. Right lumpectomy and sentinel lymph node biopsy on 09/18/2018  2. Adjuvant chemotherapy with 4 cycles of AC from 10/30/2018 through 01/01/2019  3. Right partial mastectomy on 02/18/2020  4. Right breast lumpectomy on 07/13/2020  5. Right mastectomy followed by radiation therapy completed in May 2022   Current Therapy:  anastrozole started June 2020    HISTORY OF PRESENT ILLNESS:   Oncology History  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)  09/18/2018 Initial Diagnosis   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)   09/26/2018 Cancer Staging   Staging form: Breast, AJCC 8th Edition - Clinical stage from 09/26/2018: Stage IB (cT1c, cN0, cM0, G3, ER+, PR-, HER2-) - Signed by [name], MD on 09/26/2018   10/30/2018 - 01/01/2019 Chemotherapy   Patient is on Treatment Plan : BREAST Adjuvant AC q21d        INTERVAL HISTORY:   Megan [name] is a 62 y.o. female presenting to clinic today for follow up of recurrent right breast cancer. She was  last seen by me on 12/26/22.  Since her last visit, she underwent brain MRI on 01/11/23 showing: no evidence of intracranial metastatic disease; nonspecific minimal multifocal T2 FLAIR hyperintense signal abnormality within cerebral white matter, similar to prior MRI in 02/2021.  She also met with Dr. Lovell Sheehan on 01/02/23. She is scheduled for excision of the right chest wall mass on 01/18/23.  Today, she states that she is doing well overall. Her appetite level is at 100%. Her energy level is at 80%.  PAST MEDICAL HISTORY:   Past Medical History: Past Medical History:  Diagnosis Date   (HFpEF) heart failure with preserved ejection fraction (HCC)    a. 01/2019 Echo: EF 55-60%, mild conc LVH. DD.  Torn MV chordae.   Anemia    Atypical chest pain    a. 08/2018 MV: EF 59%, no ischemia; b. 02/2019 Cath: nonobs dzs.   Blood transfusion without reported diagnosis    Breast cancer (HCC) 10/12/2020   Cataract    ESRD (end stage renal disease) on dialysis [facility])    a. HD T, T, S   Essential hypertension, benign    GERD (gastroesophageal reflux disease)    Headache    Hemorrhoids    Mixed hyperlipidemia    Morbid obesity (HCC)    Non-obstructive CAD (coronary artery disease)    a. 02/2019 CathL  LM nl, LAD 1m, LCX nl, RCA 25p, EF 55-65%.   PONV (postoperative nausea and vomiting)    S/P colonoscopy Jan 2011   Dr. Elnoria Howard: sessile polyp (benign lymphoid), large hemorrhoids, repeat 5-10 years   Temporal arteritis (HCC)    Type 2 diabetes mellitus (HCC)    Wears glasses     Surgical History: Past Surgical History:  Procedure Laterality Date   ABDOMINAL HYSTERECTOMY     APPENDECTOMY     ARTERY BIOPSY N/A 05/09/2018   Procedure: RIGHT TEMPORAL ARTERY BIOPSY;  Surgeon: Jimmye Norman, MD;  Location: [facility];  Service: General;  Laterality: N/A;   BREAST BIOPSY Right 06/15/2020   Procedure: RIGHT BREAST BIOPSY;  Surgeon: Franky Macho, MD;  Location: [facility];  Service: General;  Laterality: Right;    CATARACT EXTRACTION W/PHACO Left 02/09/2017   Procedure: CATARACT EXTRACTION PHACO AND INTRAOCULAR LENS PLACEMENT LEFT EYE;  Surgeon: Gemma Payor, MD;  Location: [facility];  Service: Ophthalmology;  Laterality: Left;  CDE: 4.89   CATARACT EXTRACTION W/PHACO Right 06/04/2017   Procedure: CATARACT EXTRACTION PHACO AND INTRAOCULAR LENS PLACEMENT (IOC);  Surgeon: Gemma Payor, MD;  Location: [facility];  Service: Ophthalmology;  Laterality: Right;  CDE: 4.12   CHOLECYSTECTOMY  09/29/2011   Procedure: LAPAROSCOPIC CHOLECYSTECTOMY;  Surgeon: Dalia Heading, MD;  Location: [facility];  Service: General;  Laterality: N/A;   COLONOSCOPY  08/2009   Dr. Elnoria Howard: sessile polyp (benign lymphoid), large hemorrhoids, repeat 5-10 years   COLONOSCOPY N/A 06/12/2016   prominent hemorrhoids   COLONOSCOPY WITH PROPOFOL N/A 06/16/2021   Procedure: COLONOSCOPY WITH PROPOFOL;  Surgeon: Corbin Ade, MD;  [facility];  Service: Endoscopy;  Laterality: N/A;  9:30am (dialysis pt)   ESOPHAGOGASTRODUODENOSCOPY  09/05/2011   JYN:WGNFA hiatal hernia; remainder of exam normal. No explanation for patient's abdominal pain with today's examination   ESOPHAGOGASTRODUODENOSCOPY N/A 12/17/2013   Dr. Jena Gauss: gastric erythema, erosion, mild chronic inflammation on path    ESOPHAGOGASTRODUODENOSCOPY (EGD) WITH PROPOFOL N/A 06/16/2021   Procedure: ESOPHAGOGASTRODUODENOSCOPY (EGD) WITH PROPOFOL;  Surgeon: Corbin Ade, MD;  [facility];  Service: Endoscopy;  Laterality: N/A;   EXCISION OF BREAST BIOPSY Right 10/12/2020   Procedure: EXCISION OF RIGHT BREAST BIOPSY;  Surgeon: Franky Macho, MD;  Location: [facility];  Service: General;  Laterality: Right;   LAPAROSCOPIC APPENDECTOMY  09/29/2011   Procedure: APPENDECTOMY LAPAROSCOPIC;  Surgeon: Dalia Heading, MD;  Location: [facility];  Service: General;;  incidental appendectomy   LEFT HEART CATH AND CORONARY ANGIOGRAPHY N/A 02/28/2019   Procedure: LEFT HEART CATH AND CORONARY  ANGIOGRAPHY;  Surgeon: Corky Crafts, MD;  Location: [facility] INVASIVE CV LAB;  Service: Cardiovascular;  Laterality: N/A;   MASTECTOMY MODIFIED RADICAL Right 02/18/2020   Procedure: MASTECTOMY MODIFIED RADICAL;  Surgeon: Franky Macho, MD;  Location: [facility];  Service: General;  Laterality: Right;   MASTECTOMY, PARTIAL Right 07/13/2020   Procedure: RIGHT PARTIAL MASTECTOMY;  Surgeon: Franky Macho, MD;  Location: [facility];  Service: General;  Laterality: Right;   PARTIAL MASTECTOMY WITH NEEDLE LOCALIZATION AND AXILLARY SENTINEL LYMPH NODE BX Right 09/18/2018   Procedure: RIGHT PARTIAL MASTECTOMY AFTER NEEDLE LOCALIZATION, SENTINEL LYMPH NODE BIOPSY RIGHT AXILLA;  Surgeon: Franky Macho, MD;  Location: [facility];  Service: General;  Laterality: Right;   POLYPECTOMY  06/16/2021   Procedure: POLYPECTOMY;  Surgeon: Corbin Ade, MD;  [facility];  Service: Endoscopy;;   SIMPLE MASTECTOMY WITH AXILLARY SENTINEL NODE BIOPSY Left 06/15/2020  Procedure: LEFT SIMPLE MASTECTOMY;  Surgeon: Franky Macho, MD;  Location: [facility];  Service: General;  Laterality: Left;    Social History: Social History   Socioeconomic History   Marital status: Married    Spouse name: Not on file   Number of children: Not on file   Years of education: Not on file   Highest education level: Not on file  Occupational History   Occupation: Systems developer: [employer]  Tobacco Use   Smoking status: Never    Passive exposure: Never   Smokeless tobacco: Never  Vaping Use   Vaping Use: Never used  Substance and Sexual Activity   Alcohol use: No   Drug use: No   Sexual activity: Yes    Birth control/protection: Surgical  Other Topics Concern   Not on file  Social History Narrative   Works at [employer] in [place].    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   Also has to get items from high shelves-causes achy pain in shoulder area      Married.   Children are grown, out of house.    Social Determinants of Health   Financial Resource Strain: Low Risk  (10/10/2022)   Overall Financial Resource Strain (CARDIA)    Difficulty of Paying Living Expenses: Not hard at all  Food Insecurity: No Food Insecurity (10/10/2022)   Hunger Vital Sign    Worried About Running Out of Food in the Last Year: Never true    Ran Out of Food in the Last Year: Never true  Transportation Needs: No Transportation Needs (09/14/2022)   PRAPARE - Administrator, Civil Service (Medical): No    Lack of Transportation (Non-Medical): No  Physical Activity: Inactive (09/14/2022)   Exercise Vital Sign    Days of Exercise per Week: 0 days    Minutes of Exercise per Session: 0 min  Stress: No Stress Concern Present (09/14/2022)   [employer] of Occupational Health - Occupational Stress Questionnaire    Feeling of Stress : Not at all  Social Connections: Socially Integrated (09/14/2022)   Social Connection and Isolation Panel [NHANES]    Frequency of Communication with Friends and Family: More than three times a week    Frequency of Social Gatherings with Friends and Family: Three times a week    Attends Religious Services: More than 4 times per year    Active Member of Clubs or Organizations: Yes    Attends Banker Meetings: More than 4 times per year    Marital Status: Married  Catering manager Violence: Not At Risk (09/14/2022)   Humiliation, Afraid, Rape, and Kick questionnaire    Fear of Current or Ex-Partner: No    Emotionally Abused: No    Physically Abused: No    Sexually Abused: No    Family History: Family History  Problem Relation Age of Onset   Hypertension Mother    Coronary artery disease Mother    Diabetes Mother    Hypertension Sister    Coronary artery disease Sister    Hypertension Brother    Heart attack Father    Hypertension Son    Heart attack Maternal Aunt    Hypertension Maternal Aunt    Diabetes Maternal Aunt    Heart attack Maternal Uncle     Hypertension Maternal Uncle    Diabetes Maternal Uncle    Heart attack Paternal Aunt    Hypertension Paternal  Aunt    Diabetes Paternal Aunt    Heart attack Paternal Uncle    Hypertension Paternal Uncle    Diabetes Paternal Uncle    Heart attack Maternal Grandmother    Heart attack Maternal Grandfather    Heart attack Paternal Grandmother    Heart attack Paternal Grandfather    Colon cancer Neg Hx     Current Medications:  Current Outpatient Medications:    acetaminophen (TYLENOL) 500 MG tablet, Take 500-1,000 mg by mouth every 6 (six) hours as needed (pain.)., Disp: , Rfl:    amLODipine (NORVASC) 10 MG tablet, TAKE 1 TABLET BY MOUTH AT BEDTIME, Disp: 90 tablet, Rfl: 3   aspirin EC 81 MG tablet, Take 81 mg by mouth in the morning., Disp: , Rfl:    atorvastatin (LIPITOR) 20 MG tablet, Take 1 tablet (20 mg total) by mouth daily. TAKE 1 TABLET DAILY (Patient taking differently: Take 20 mg by mouth at bedtime. TAKE 1 TABLET DAILY), Disp: 90 tablet, Rfl: 3   B Complex-C-Zn-Folic Acid (DIALYVITE 800-ZINC 15) 0.8 MG TABS, Take 1 tablet by mouth in the morning., Disp: , Rfl:    cinacalcet (SENSIPAR) 30 MG tablet, Take 2 tablets (60 mg total) by mouth daily. (Patient taking differently: Take 30 mg by mouth daily. With a meal), Disp: 90 tablet, Rfl: 0   Darbepoetin Alfa (ARANESP, ALBUMIN FREE, IJ), every Monday, Wednesday, and Friday with hemodialysis., Disp: , Rfl:    fluticasone (FLONASE) 50 MCG/ACT nasal spray, Place 2 sprays into both nostrils daily., Disp: 16 g, Rfl: 6   gabapentin (NEURONTIN) 300 MG capsule, TAKE 1 CAPSULE(300 MG) BY MOUTH AT BEDTIME, Disp: 90 capsule, Rfl: 1   isosorbide mononitrate (IMDUR) 30 MG 24 hr tablet, Take 1.5 tablets (45 mg total) by mouth daily., Disp: 135 tablet, Rfl: 3   lanthanum (FOSRENOL) 1000 MG chewable tablet, Chew 2,000-3,000 mg by mouth See admin instructions. Take 3 tablets (3000 mg) by mouth with meals and take 2 tablets (2000 mg) with snacks, Disp:  , Rfl:    lidocaine-prilocaine (EMLA) cream, Apply 1 application  topically every Monday, Wednesday, and Friday with hemodialysis., Disp: , Rfl:    meclizine (ANTIVERT) 25 MG tablet, TAKE 1 TABLET(25 MG) BY MOUTH THREE TIMES DAILY AS NEEDED FOR DIZZINESS, Disp: 30 tablet, Rfl: 1   nebivolol (BYSTOLIC) 10 MG tablet, Take 1 tablet (10 mg total) by mouth in the morning and at bedtime., Disp: 180 tablet, Rfl: 3   nitroGLYCERIN (NITROSTAT) 0.4 MG SL tablet, Place 1 tablet (0.4 mg total) under the tongue every 5 (five) minutes x 3 doses as needed for chest pain (if no relief after 3rd dose, proceed to ED or call 911)., Disp: 25 tablet, Rfl: 3   ondansetron (ZOFRAN) 4 MG tablet, Take 1 tablet (4 mg total) by mouth every 8 (eight) hours as needed for nausea or vomiting., Disp: 10 tablet, Rfl: 0   oxyCODONE-acetaminophen (PERCOCET/ROXICET) 5-325 MG tablet, Take 1 tablet by mouth every 6 (six) hours as needed (pain.)., Disp: , Rfl:    pantoprazole (PROTONIX) 40 MG tablet, TAKE 1 TABLET(40 MG) BY MOUTH DAILY, Disp: 90 tablet, Rfl: 0   sucralfate (CARAFATE) 1 GM/10ML suspension, Take 10 mLs (1 g total) by mouth with breakfast, with lunch, and with evening meal. (Patient taking differently: Take 1 g by mouth 3 (three) times daily as needed (stomach pain.).), Disp: 420 mL, Rfl: 2   anastrozole (ARIMIDEX) 1 MG tablet, TAKE 1 TABLET BY MOUTH DAILY, Disp: 90 tablet, Rfl:  3   Allergies: No Known Allergies  REVIEW OF SYSTEMS:   Review of Systems  Constitutional:  Negative for chills, fatigue and fever.  HENT:   Negative for lump/mass, mouth sores, nosebleeds, sore throat and trouble swallowing.   Eyes:  Negative for eye problems.  Respiratory:  Negative for cough and shortness of breath.   Cardiovascular:  Negative for chest pain, leg swelling and palpitations.  Gastrointestinal:  Positive for constipation and diarrhea. Negative for abdominal pain, nausea and vomiting.  Genitourinary:  Negative for bladder  incontinence, difficulty urinating, dysuria, frequency, hematuria and nocturia.   Musculoskeletal:  Negative for arthralgias, back pain, flank pain, myalgias and neck pain.  Skin:  Negative for itching and rash.  Neurological:  Positive for dizziness and headaches. Negative for numbness.  Hematological:  Does not bruise/bleed easily.  Psychiatric/Behavioral:  Negative for depression, sleep disturbance and suicidal ideas. The patient is not nervous/anxious.   All other systems reviewed and are negative.    VITALS:   Blood pressure 124/73, pulse 84, temperature 98.5 F (36.9 C), temperature source Oral, resp. rate 17, height 5' 1.5" (1.562 m), weight 181 lb 14.4 oz (82.5 kg), SpO2 98 %.  Wt Readings from Last 3 Encounters:  01/16/23 181 lb 14.4 oz (82.5 kg)  01/02/23 187 lb (84.8 kg)  12/26/22 184 lb 6.4 oz (83.6 kg)    Body mass index is 33.81 kg/m.  Performance status (ECOG): 1 - Symptomatic but completely ambulatory  PHYSICAL EXAM:   Physical Exam Vitals and nursing note reviewed. Exam conducted with a chaperone present.  Constitutional:      Appearance: Normal appearance.  Cardiovascular:     Rate and Rhythm: Normal rate and regular rhythm.     Pulses: Normal pulses.     Heart sounds: Normal heart sounds.  Pulmonary:     Effort: Pulmonary effort is normal.     Breath sounds: Normal breath sounds.  Abdominal:     Palpations: Abdomen is soft. There is no hepatomegaly, splenomegaly or mass.     Tenderness: There is no abdominal tenderness.  Musculoskeletal:     Right lower leg: No edema.     Left lower leg: No edema.  Lymphadenopathy:     Cervical: No cervical adenopathy.     Right cervical: No superficial, deep or posterior cervical adenopathy.    Left cervical: No superficial, deep or posterior cervical adenopathy.     Upper Body:     Right upper body: No supraclavicular or axillary adenopathy.     Left upper body: No supraclavicular or axillary adenopathy.   Neurological:     General: No focal deficit present.     Mental Status: She is alert and oriented to person, place, and time.  Psychiatric:        Mood and Affect: Mood normal.        Behavior: Behavior normal.     LABS:      Latest Ref Rng & Units 12/26/2022    9:28 AM 12/19/2022    9:25 AM 09/12/2022   11:20 AM  CBC  WBC 4.0 - 10.5 K/uL 7.3  6.7  7.6   Hemoglobin 12.0 - 15.0 g/dL 16.1  09.6  04.5   Hematocrit 36.0 - 46.0 % 34.8  32.3  34.6   Platelets 150 - 400 K/uL 252  226  269       Latest Ref Rng & Units 12/26/2022    9:28 AM 09/12/2022   11:20 AM 08/15/2022   10:45  AM  CMP  Glucose 70 - 99 mg/dL 409  96  811   BUN 6 - 20 mg/dL 37  46  64   Creatinine 0.44 - 1.00 mg/dL 9.14  78.29  56.21   Sodium 135 - 145 mmol/L 138  138  139   Potassium 3.5 - 5.1 mmol/L 3.9  3.8  3.6   Chloride 98 - 111 mmol/L 95  96  97   CO2 22 - 32 mmol/L 29  27  26    Calcium 8.9 - 10.3 mg/dL 30.8  9.8  9.4   Total Protein 6.5 - 8.1 g/dL 7.7  7.3    Total Bilirubin 0.3 - 1.2 mg/dL 0.7  0.7    Alkaline Phos 38 - 126 U/L 93  90    AST 15 - 41 U/L 14  26    ALT 0 - 44 U/L 21  27       No results found for: "CEA1", "CEA" / No results found for: "CEA1", "CEA" No results found for: "PSA1" No results found for: "MVH846" No results found for: "CAN125"  No results found for: "TOTALPROTELP", "ALBUMINELP", "A1GS", "A2GS", "BETS", "BETA2SER", "GAMS", "MSPIKE", "SPEI" Lab Results  Component Value Date   TIBC 195 (L) 02/15/2021   TIBC 190 (L) 12/10/2018   TIBC 218 (L) 06/08/2016   FERRITIN 1,742 (H) 02/15/2021   FERRITIN 1,308 (H) 12/10/2018   FERRITIN 126 06/08/2016   IRONPCTSAT 15 02/15/2021   IRONPCTSAT 43 (H) 12/10/2018   IRONPCTSAT 22 06/08/2016   No results found for: "LDH"   STUDIES:   MR Brain W Wo Contrast  Result Date: 01/11/2023 CLINICAL DATA:  Provided history: Metastatic adenocarcinoma. Metastatic disease evaluation. Metastatic breast cancer. New onset headaches. EXAM: MRI HEAD  WITHOUT AND WITH CONTRAST TECHNIQUE: Multiplanar, multiecho pulse sequences of the brain and surrounding structures were obtained without and with intravenous contrast. CONTRAST:  10mL GADAVIST GADOBUTROL 1 MMOL/ML IV SOLN COMPARISON:  Brain MRI 02/14/2021.  Head CT 02/14/2021. FINDINGS: Brain: No age advanced or lobar predominant cerebral atrophy. Minimal multifocal T2 FLAIR hyperintense signal abnormality within the bilateral cerebral white matter, nonspecific but most often secondary to chronic small vessel ischemia. There is no acute infarct. No evidence of an intracranial mass. No chronic intracranial blood products. No extra-axial fluid collection. No midline shift. No pathologic intracranial enhancement identified. Vascular: Maintained flow voids within the proximal large arterial vessels. Skull and upper cervical spine: Paranasal sinus disease. No focal suspicious marrow lesion. Sinuses/Orbits: No mass or acute finding within the imaged orbits. Prior bilateral ocular lens replacement. No significant IMPRESSION: 1. No evidence of intracranial metastatic disease. 2. Minimal multifocal T2 FLAIR hyperintense signal abnormality within the cerebral white matter, nonspecific but most often secondary to chronic small vessel ischemia. Findings are similar to prior brain MRI of 02/14/2021. Electronically Signed   By: Jackey Loge D.O.   On: 01/11/2023 17:45   CT Biopsy  Result Date: 12/19/2022 INDICATION: 62 year old woman with history of breast cancer a new right chest wall FDG avid soft tissue mass presents to IR for imaging guided biopsy. EXAM: CT and ultrasound-guided biopsy of right chest wall mass TECHNIQUE: Multidetector CT imaging of the chest was performed following the standard protocol without IV contrast. RADIATION DOSE REDUCTION: This exam was performed according to the departmental dose-optimization program which includes automated exposure control, adjustment of the mA and/or kV according to patient  size and/or use of iterative reconstruction technique. MEDICATIONS: None. ANESTHESIA/SEDATION: Moderate (conscious) sedation was employed during this  procedure. A total of Versed 1 mg and Fentanyl 25 mcg was administered intravenously by the radiology nurse. Total intra-service moderate Sedation Time: 13 minutes. The patient's level of consciousness and vital signs were monitored continuously by radiology nursing throughout the procedure under my direct supervision. COMPLICATIONS: None immediate. PROCEDURE: Informed written consent was obtained from the patient after a thorough discussion of the procedural risks, benefits and alternatives. All questions were addressed. Maximal Sterile Barrier Technique was utilized including caps, mask, sterile gowns, sterile gloves, sterile drape, hand hygiene and skin antiseptic. A timeout was performed prior to the initiation of the procedure. Patient position supine on the ultrasound table. Right anterior chest wall skin prepped and draped in usual sterile fashion. Following local lidocaine administration, 17 gauge introducer needle was advanced into the right anterior chest wall lesion. The position of the needle was confirmed with CT. Four 18 gauge cores were obtained from the right anterior chest wall mass utilizing continuous ultrasound guidance. Samples were sent to pathology in formalin. Needle removed and hemostasis achieved with 2 minutes of manual compression. Post procedure ultrasound images showed no evidence of significant hemorrhage or pneumothorax. IMPRESSION: Ultrasound and CT-guided biopsy of right anterior chest wall mass. Electronically Signed   By: Acquanetta Belling M.D.   On: 12/19/2022 15:35   CT US GUIDED BIOPSY  Result Date: 12/19/2022 INDICATION: 62 year old woman with history of breast cancer a new right chest wall FDG avid soft tissue mass presents to IR for imaging guided biopsy. EXAM: CT and ultrasound-guided biopsy of right chest wall mass TECHNIQUE:  Multidetector CT imaging of the chest was performed following the standard protocol without IV contrast. RADIATION DOSE REDUCTION: This exam was performed according to the departmental dose-optimization program which includes automated exposure control, adjustment of the mA and/or kV according to patient size and/or use of iterative reconstruction technique. MEDICATIONS: None. ANESTHESIA/SEDATION: Moderate (conscious) sedation was employed during this procedure. A total of Versed 1 mg and Fentanyl 25 mcg was administered intravenously by the radiology nurse. Total intra-service moderate Sedation Time: 13 minutes. The patient's level of consciousness and vital signs were monitored continuously by radiology nursing throughout the procedure under my direct supervision. COMPLICATIONS: None immediate. PROCEDURE: Informed written consent was obtained from the patient after a thorough discussion of the procedural risks, benefits and alternatives. All questions were addressed. Maximal Sterile Barrier Technique was utilized including caps, mask, sterile gowns, sterile gloves, sterile drape, hand hygiene and skin antiseptic. A timeout was performed prior to the initiation of the procedure. Patient position supine on the ultrasound table. Right anterior chest wall skin prepped and draped in usual sterile fashion. Following local lidocaine administration, 17 gauge introducer needle was advanced into the right anterior chest wall lesion. The position of the needle was confirmed with CT. Four 18 gauge cores were obtained from the right anterior chest wall mass utilizing continuous ultrasound guidance. Samples were sent to pathology in formalin. Needle removed and hemostasis achieved with 2 minutes of manual compression. Post procedure ultrasound images showed no evidence of significant hemorrhage or pneumothorax. IMPRESSION: Ultrasound and CT-guided biopsy of right anterior chest wall mass. Electronically Signed   By: Acquanetta Belling M.D.   On: 12/19/2022 15:35

## 2023-01-16 ENCOUNTER — Other Ambulatory Visit: Payer: Self-pay

## 2023-01-16 ENCOUNTER — Other Ambulatory Visit: Payer: Self-pay | Admitting: *Deleted

## 2023-01-16 ENCOUNTER — Encounter (HOSPITAL_COMMUNITY)
Admission: RE | Admit: 2023-01-16 | Discharge: 2023-01-16 | Disposition: A | Discharge status: Home / Self Care | Payer: Medicare Other | Source: Ambulatory Visit | Attending: General Surgery | Admitting: General Surgery

## 2023-01-16 ENCOUNTER — Other Ambulatory Visit: Payer: Self-pay | Admitting: Hematology

## 2023-01-16 ENCOUNTER — Inpatient Hospital Stay: Payer: Medicare Other | Attending: Hematology | Admitting: Hematology

## 2023-01-16 ENCOUNTER — Encounter (HOSPITAL_COMMUNITY): Payer: Self-pay

## 2023-01-16 DIAGNOSIS — C50811 Malignant neoplasm of overlapping sites of right female breast: Secondary | ICD-10-CM | POA: Insufficient documentation

## 2023-01-16 DIAGNOSIS — Z79899 Other long term (current) drug therapy: Secondary | ICD-10-CM | POA: Diagnosis not present

## 2023-01-16 DIAGNOSIS — M8580 Other specified disorders of bone density and structure, unspecified site: Secondary | ICD-10-CM | POA: Diagnosis not present

## 2023-01-16 DIAGNOSIS — Z79811 Long term (current) use of aromatase inhibitors: Secondary | ICD-10-CM | POA: Diagnosis not present

## 2023-01-16 DIAGNOSIS — I5030 Unspecified diastolic (congestive) heart failure: Secondary | ICD-10-CM | POA: Insufficient documentation

## 2023-01-16 DIAGNOSIS — G620 Drug-induced polyneuropathy: Secondary | ICD-10-CM | POA: Insufficient documentation

## 2023-01-16 DIAGNOSIS — Z992 Dependence on renal dialysis: Secondary | ICD-10-CM

## 2023-01-16 DIAGNOSIS — E559 Vitamin D deficiency, unspecified: Secondary | ICD-10-CM | POA: Diagnosis not present

## 2023-01-16 DIAGNOSIS — K219 Gastro-esophageal reflux disease without esophagitis: Secondary | ICD-10-CM | POA: Diagnosis not present

## 2023-01-16 DIAGNOSIS — E785 Hyperlipidemia, unspecified: Secondary | ICD-10-CM | POA: Insufficient documentation

## 2023-01-16 DIAGNOSIS — Z170 Estrogen receptor positive status [ER+]: Secondary | ICD-10-CM | POA: Diagnosis not present

## 2023-01-16 DIAGNOSIS — Z9013 Acquired absence of bilateral breasts and nipples: Secondary | ICD-10-CM | POA: Insufficient documentation

## 2023-01-16 DIAGNOSIS — N186 End stage renal disease: Secondary | ICD-10-CM | POA: Diagnosis not present

## 2023-01-16 DIAGNOSIS — C50211 Malignant neoplasm of upper-inner quadrant of right female breast: Secondary | ICD-10-CM

## 2023-01-16 DIAGNOSIS — I10 Essential (primary) hypertension: Secondary | ICD-10-CM | POA: Insufficient documentation

## 2023-01-16 MED ORDER — ANASTROZOLE 1 MG PO TABS
1.0000 mg | ORAL_TABLET | Freq: Every day | ORAL | 1 refills | Status: DC
Start: 2023-01-16 — End: 2023-01-16

## 2023-01-16 NOTE — Telephone Encounter (Signed)
Anastrozole refill approved.  Patient is tolerating and is to continue therapy.  

## 2023-01-16 NOTE — Patient Instructions (Signed)
[facility]  Discharge Instructions  You were seen and examined today by Dr. Ellin Saba.  Dr. Ellin Saba discussed your most recent MRI scan which did not reveal anything unexpected.  Dr. Ellin Saba will see you again 4 weeks after surgery with the full pathology report. At that time, there may be a change made to your anastrozole.  Follow-up as scheduled.  Thank you for choosing [facility] to provide your oncology and hematology care.   To afford each patient quality time with our provider, please arrive at least 15 minutes before your scheduled appointment time. You may need to reschedule your appointment if you arrive late (10 or more minutes). Arriving late affects you and other patients whose appointments are after yours.  Also, if you miss three or more appointments without notifying the office, you may be dismissed from the clinic at the provider's discretion.    Again, thank you for choosing [facility].  Our hope is that these requests will decrease the amount of time that you wait before being seen by our physicians.   If you have a lab appointment with the Cancer Center - please note that after April 8th, all labs will be drawn in the cancer center.  You do not have to check in or register with the main entrance as you have in the past but will complete your check-in at the cancer center.            _____________________________________________________________  Should you have questions after your visit to [facility], please contact our office at [phone] and follow the prompts.  Our office hours are 8:00 a.m. to 4:30 p.m. Monday - Thursday and 8:00 a.m. to 2:30 p.m. Friday.  Please note that voicemails left after 4:00 p.m. may not be returned until the following business day.  We are closed weekends and all major holidays.  You do have access to a nurse 24-7, just call the main number to the  clinic [phone] and do not press any options, hold on the line and a nurse will answer the phone.    For prescription refill requests, have your pharmacy contact our office and allow 72 hours.    Masks are no longer required in the cancer centers. If you would like for your care team to wear a mask while they are taking care of you, please let them know. You may have one support person who is at least 61 years old accompany you for your appointments.

## 2023-01-16 NOTE — Progress Notes (Signed)
   01/16/23 1131  OBSTRUCTIVE SLEEP APNEA  Have you ever been diagnosed with sleep apnea through a sleep study? No  Do you snore loudly (loud enough to be heard through closed doors)?  1  Do you often feel tired, fatigued, or sleepy during the daytime (such as falling asleep during driving or talking to someone)? 0  Has anyone observed you stop breathing during your sleep? 1  Do you have, or are you being treated for high blood pressure? 1  BMI more than 35 kg/m2? 0  Age > 50 (1-yes) 1  Neck circumference greater than:Female 16 inches or larger, Female 17inches or larger? 0  Female Gender (Yes=1) 0  Obstructive Sleep Apnea Score 4  Score 5 or greater  Results sent to PCP

## 2023-01-17 DIAGNOSIS — D509 Iron deficiency anemia, unspecified: Secondary | ICD-10-CM | POA: Diagnosis not present

## 2023-01-17 DIAGNOSIS — D631 Anemia in chronic kidney disease: Secondary | ICD-10-CM | POA: Diagnosis not present

## 2023-01-17 DIAGNOSIS — I159 Secondary hypertension, unspecified: Secondary | ICD-10-CM | POA: Diagnosis not present

## 2023-01-17 DIAGNOSIS — N2581 Secondary hyperparathyroidism of renal origin: Secondary | ICD-10-CM | POA: Diagnosis not present

## 2023-01-17 DIAGNOSIS — N186 End stage renal disease: Secondary | ICD-10-CM | POA: Diagnosis not present

## 2023-01-17 DIAGNOSIS — Z992 Dependence on renal dialysis: Secondary | ICD-10-CM | POA: Diagnosis not present

## 2023-01-18 ENCOUNTER — Ambulatory Visit (HOSPITAL_BASED_OUTPATIENT_CLINIC_OR_DEPARTMENT_OTHER): Payer: Medicare Other | Admitting: Certified Registered Nurse Anesthetist

## 2023-01-18 ENCOUNTER — Ambulatory Visit (HOSPITAL_COMMUNITY): Payer: Medicare Other | Admitting: Certified Registered Nurse Anesthetist

## 2023-01-18 ENCOUNTER — Ambulatory Visit (HOSPITAL_COMMUNITY)
Admission: RE | Admit: 2023-01-18 | Discharge: 2023-01-18 | Disposition: A | Discharge status: Home / Self Care | Payer: Medicare Other | Attending: General Surgery | Admitting: General Surgery

## 2023-01-18 ENCOUNTER — Encounter (HOSPITAL_COMMUNITY)
Admission: RE | Disposition: A | Discharge status: Home / Self Care | Payer: Self-pay | Source: Home / Self Care | Attending: General Surgery

## 2023-01-18 ENCOUNTER — Encounter (HOSPITAL_COMMUNITY): Payer: Self-pay | Admitting: General Surgery

## 2023-01-18 ENCOUNTER — Other Ambulatory Visit: Payer: Self-pay

## 2023-01-18 DIAGNOSIS — I2510 Atherosclerotic heart disease of native coronary artery without angina pectoris: Secondary | ICD-10-CM | POA: Diagnosis not present

## 2023-01-18 DIAGNOSIS — E1122 Type 2 diabetes mellitus with diabetic chronic kidney disease: Secondary | ICD-10-CM | POA: Diagnosis not present

## 2023-01-18 DIAGNOSIS — D649 Anemia, unspecified: Secondary | ICD-10-CM | POA: Insufficient documentation

## 2023-01-18 DIAGNOSIS — Z992 Dependence on renal dialysis: Secondary | ICD-10-CM | POA: Diagnosis not present

## 2023-01-18 DIAGNOSIS — I509 Heart failure, unspecified: Secondary | ICD-10-CM | POA: Insufficient documentation

## 2023-01-18 DIAGNOSIS — N186 End stage renal disease: Secondary | ICD-10-CM

## 2023-01-18 DIAGNOSIS — Z9011 Acquired absence of right breast and nipple: Secondary | ICD-10-CM | POA: Diagnosis not present

## 2023-01-18 DIAGNOSIS — D759 Disease of blood and blood-forming organs, unspecified: Secondary | ICD-10-CM | POA: Insufficient documentation

## 2023-01-18 DIAGNOSIS — C50911 Malignant neoplasm of unspecified site of right female breast: Secondary | ICD-10-CM

## 2023-01-18 DIAGNOSIS — I120 Hypertensive chronic kidney disease with stage 5 chronic kidney disease or end stage renal disease: Secondary | ICD-10-CM | POA: Diagnosis not present

## 2023-01-18 DIAGNOSIS — I132 Hypertensive heart and chronic kidney disease with heart failure and with stage 5 chronic kidney disease, or end stage renal disease: Secondary | ICD-10-CM | POA: Insufficient documentation

## 2023-01-18 DIAGNOSIS — D630 Anemia in neoplastic disease: Secondary | ICD-10-CM

## 2023-01-18 DIAGNOSIS — N641 Fat necrosis of breast: Secondary | ICD-10-CM | POA: Diagnosis not present

## 2023-01-18 DIAGNOSIS — Z171 Estrogen receptor negative status [ER-]: Secondary | ICD-10-CM | POA: Diagnosis not present

## 2023-01-18 DIAGNOSIS — N6489 Other specified disorders of breast: Secondary | ICD-10-CM | POA: Diagnosis not present

## 2023-01-18 HISTORY — PX: MASS EXCISION: SHX2000

## 2023-01-18 LAB — POCT I-STAT, CHEM 8
BUN: 46 mg/dL — ABNORMAL HIGH (ref 6–20)
Calcium, Ion: 1.1 mmol/L — ABNORMAL LOW (ref 1.15–1.40)
Chloride: 97 mmol/L — ABNORMAL LOW (ref 98–111)
Creatinine, Ser: 10.7 mg/dL — ABNORMAL HIGH (ref 0.44–1.00)
Glucose, Bld: 120 mg/dL — ABNORMAL HIGH (ref 70–99)
HCT: 34 % — ABNORMAL LOW (ref 36.0–46.0)
Hemoglobin: 11.6 g/dL — ABNORMAL LOW (ref 12.0–15.0)
Potassium: 3.4 mmol/L — ABNORMAL LOW (ref 3.5–5.1)
Sodium: 138 mmol/L (ref 135–145)
TCO2: 34 mmol/L — ABNORMAL HIGH (ref 22–32)

## 2023-01-18 LAB — GLUCOSE, CAPILLARY
Glucose-Capillary: 117 mg/dL — ABNORMAL HIGH (ref 70–99)
Glucose-Capillary: 96 mg/dL (ref 70–99)

## 2023-01-18 SURGERY — EXCISION MASS
Anesthesia: General | Site: Breast | Laterality: Right

## 2023-01-18 MED ORDER — FENTANYL CITRATE (PF) 100 MCG/2ML IJ SOLN
INTRAMUSCULAR | Status: AC
  Filled 2023-01-18: qty 2

## 2023-01-18 MED ORDER — 0.9 % SODIUM CHLORIDE (POUR BTL) OPTIME
TOPICAL | Status: DC | PRN
  Administered 2023-01-18: 1000 mL

## 2023-01-18 MED ORDER — GLYCOPYRROLATE PF 0.2 MG/ML IJ SOSY
PREFILLED_SYRINGE | INTRAMUSCULAR | Status: AC
  Filled 2023-01-18: qty 1

## 2023-01-18 MED ORDER — CHLORHEXIDINE GLUCONATE CLOTH 2 % EX PADS: 6.0000 | MEDICATED_PAD | Freq: Once | CUTANEOUS | Status: DC

## 2023-01-18 MED ORDER — LACTATED RINGERS IV SOLN: INTRAVENOUS | Status: DC

## 2023-01-18 MED ORDER — FENTANYL CITRATE PF 50 MCG/ML IJ SOSY: 25.0000 ug | PREFILLED_SYRINGE | INTRAMUSCULAR | Status: DC | PRN

## 2023-01-18 MED ORDER — LIDOCAINE HCL (PF) 2 % IJ SOLN
INTRAMUSCULAR | Status: AC
  Filled 2023-01-18: qty 10

## 2023-01-18 MED ORDER — PROPOFOL 10 MG/ML IV BOLUS
INTRAVENOUS | Status: DC | PRN
  Administered 2023-01-18: 150 mg via INTRAVENOUS

## 2023-01-18 MED ORDER — PHENYLEPHRINE 80 MCG/ML (10ML) SYRINGE FOR IV PUSH (FOR BLOOD PRESSURE SUPPORT)
PREFILLED_SYRINGE | INTRAVENOUS | Status: AC
  Filled 2023-01-18: qty 10

## 2023-01-18 MED ORDER — ONDANSETRON HCL 4 MG/2ML IJ SOLN
INTRAMUSCULAR | Status: AC
  Filled 2023-01-18: qty 2

## 2023-01-18 MED ORDER — MIDAZOLAM HCL 5 MG/5ML IJ SOLN
INTRAMUSCULAR | Status: DC | PRN
  Administered 2023-01-18: 2 mg via INTRAVENOUS

## 2023-01-18 MED ORDER — PROPOFOL 10 MG/ML IV BOLUS
INTRAVENOUS | Status: AC
  Filled 2023-01-18: qty 20

## 2023-01-18 MED ORDER — OXYCODONE HCL 5 MG/5ML PO SOLN: 5.0000 mg | Freq: Once | ORAL | Status: DC | PRN

## 2023-01-18 MED ORDER — OXYCODONE HCL 5 MG PO TABS: 5.0000 mg | ORAL_TABLET | Freq: Once | ORAL | Status: DC | PRN

## 2023-01-18 MED ORDER — BUPIVACAINE HCL (PF) 0.5 % IJ SOLN
INTRAMUSCULAR | Status: AC
  Filled 2023-01-18: qty 30

## 2023-01-18 MED ORDER — ONDANSETRON HCL 4 MG/2ML IJ SOLN
INTRAMUSCULAR | Status: DC | PRN
  Administered 2023-01-18: 4 mg via INTRAVENOUS

## 2023-01-18 MED ORDER — DEXAMETHASONE SODIUM PHOSPHATE 10 MG/ML IJ SOLN
INTRAMUSCULAR | Status: AC
  Filled 2023-01-18: qty 1

## 2023-01-18 MED ORDER — CHLORHEXIDINE GLUCONATE 0.12 % MT SOLN
OROMUCOSAL | Status: AC
  Filled 2023-01-18: qty 15

## 2023-01-18 MED ORDER — LIDOCAINE HCL (PF) 2 % IJ SOLN
INTRAMUSCULAR | Status: AC
  Filled 2023-01-18: qty 5

## 2023-01-18 MED ORDER — CHLORHEXIDINE GLUCONATE 0.12 % MT SOLN: 15.0000 mL | Freq: Once | OROMUCOSAL | Status: DC

## 2023-01-18 MED ORDER — FENTANYL CITRATE (PF) 100 MCG/2ML IJ SOLN
INTRAMUSCULAR | Status: DC | PRN
  Administered 2023-01-18 (×3): 25 ug via INTRAVENOUS

## 2023-01-18 MED ORDER — SODIUM CHLORIDE 0.9 % IV SOLN: INTRAVENOUS | Status: DC | PRN

## 2023-01-18 MED ORDER — OXYCODONE HCL 5 MG PO TABS: 5.0000 mg | ORAL_TABLET | ORAL | 0 refills | Status: DC | PRN

## 2023-01-18 MED ORDER — PROPOFOL 500 MG/50ML IV EMUL
INTRAVENOUS | Status: AC
  Filled 2023-01-18: qty 50

## 2023-01-18 MED ORDER — MIDAZOLAM HCL 2 MG/2ML IJ SOLN
INTRAMUSCULAR | Status: AC
  Filled 2023-01-18: qty 2

## 2023-01-18 MED ORDER — ONDANSETRON HCL 4 MG/2ML IJ SOLN: 4.0000 mg | Freq: Once | INTRAMUSCULAR | Status: DC | PRN

## 2023-01-18 MED ORDER — ORAL CARE MOUTH RINSE: 15.0000 mL | Freq: Once | OROMUCOSAL | Status: DC

## 2023-01-18 MED ORDER — BUPIVACAINE HCL (PF) 0.5 % IJ SOLN
INTRAMUSCULAR | Status: DC | PRN
  Administered 2023-01-18: 10 mL

## 2023-01-18 MED ORDER — LIDOCAINE HCL (CARDIAC) PF 100 MG/5ML IV SOSY
PREFILLED_SYRINGE | INTRAVENOUS | Status: DC | PRN
  Administered 2023-01-18: 60 mg via INTRAVENOUS

## 2023-01-18 MED ORDER — PHENYLEPHRINE 80 MCG/ML (10ML) SYRINGE FOR IV PUSH (FOR BLOOD PRESSURE SUPPORT)
PREFILLED_SYRINGE | INTRAVENOUS | Status: DC | PRN
  Administered 2023-01-18 (×3): 80 ug via INTRAVENOUS
  Administered 2023-01-18: 160 ug via INTRAVENOUS

## 2023-01-18 SURGICAL SUPPLY — 35 items
ADH SKN CLS APL DERMABOND .7 (GAUZE/BANDAGES/DRESSINGS) ×1
APL PRP STRL LF ISPRP CHG 10.5 (MISCELLANEOUS) ×1
APPLICATOR CHLORAPREP 10.5 ORG (MISCELLANEOUS) ×1 IMPLANT
BLADE SURG SZ11 CARB STEEL (BLADE) IMPLANT
CLOTH BEACON ORANGE TIMEOUT ST (SAFETY) ×1 IMPLANT
COVER LIGHT HANDLE STERIS (MISCELLANEOUS) ×2 IMPLANT
DECANTER SPIKE VIAL GLASS SM (MISCELLANEOUS) ×1 IMPLANT
DERMABOND ADVANCED .7 DNX12 (GAUZE/BANDAGES/DRESSINGS) IMPLANT
DRAPE EENT ADH APERT 31X51 STR (DRAPES) IMPLANT
ELECT NDL TIP 2.8 STRL (NEEDLE) IMPLANT
ELECT NEEDLE TIP 2.8 STRL (NEEDLE) IMPLANT
ELECT REM PT RETURN 9FT ADLT (ELECTROSURGICAL) ×1
ELECTRODE REM PT RTRN 9FT ADLT (ELECTROSURGICAL) ×1 IMPLANT
GAUZE SPONGE 4X4 12PLY STRL (GAUZE/BANDAGES/DRESSINGS) IMPLANT
GLOVE BIO SURGEON STRL SZ7 (GLOVE) IMPLANT
GLOVE BIO SURGEON STRL SZ7.5 (GLOVE) IMPLANT
GLOVE BIOGEL PI IND STRL 7.0 (GLOVE) ×2 IMPLANT
GLOVE BIOGEL PI IND STRL 7.5 (GLOVE) IMPLANT
GLOVE SURG SS PI 7.5 STRL IVOR (GLOVE) ×2 IMPLANT
GOWN STRL REUS W/TWL LRG LVL3 (GOWN DISPOSABLE) ×2 IMPLANT
KIT TURNOVER KIT A (KITS) ×1 IMPLANT
MANIFOLD NEPTUNE II (INSTRUMENTS) ×1 IMPLANT
NDL HYPO 25X1 1.5 SAFETY (NEEDLE) ×1 IMPLANT
NEEDLE HYPO 25X1 1.5 SAFETY (NEEDLE) ×1 IMPLANT
NS IRRIG 1000ML POUR BTL (IV SOLUTION) ×1 IMPLANT
PACK MINOR (CUSTOM PROCEDURE TRAY) ×1 IMPLANT
PAD ARMBOARD 7.5X6 YLW CONV (MISCELLANEOUS) ×1 IMPLANT
SET BASIN LINEN APH (SET/KITS/TRAYS/PACK) ×1 IMPLANT
SUT ETHILON 3 0 FSL (SUTURE) IMPLANT
SUT MNCRL AB 4-0 PS2 18 (SUTURE) IMPLANT
SUT PROLENE 3 0 PS 1 (SUTURE) IMPLANT
SUT PROLENE 4 0 PS 2 18 (SUTURE) IMPLANT
SUT VIC AB 3-0 SH 27 (SUTURE) ×1
SUT VIC AB 3-0 SH 27X BRD (SUTURE) IMPLANT
SYR CONTROL 10ML LL (SYRINGE) ×1 IMPLANT

## 2023-01-18 NOTE — Op Note (Signed)
Patient:  Megan [name]  DOB:  February 08, 1961  MRN:  [id number]   Preop Diagnosis: Recurrent right breast cancer, chest wall  Postop Diagnosis: Same  Procedure: Excision of recurrent right breast cancer, chest wall  Surgeon: Franky Macho, MD  Anes: General  Indications: Patient is a 62 year old black female with multiple medical problems who presents with recurrent right breast cancer on the chest wall.  She is status post right modified radical mastectomy in the past.  The risks and benefits of the procedure including bleeding, infection, and recurrence of the cancer were fully explained to the patient, who gave informed consent.  Procedure note: The patient was placed in the supine position.  After general anesthesia was administered, the right chest wall was prepped and draped using usual sterile technique with ChloraPrep.  Surgical site confirmation was performed.  The mass measured approximately 4 cm in its greatest diameter.  This was superior to the previous mastectomy scar.  It was fixed to the pectoralis major muscle and chest wall.  I was able to encircle the mass and excised it from the pectoralis major muscle and rib.  A short suture was placed superiorly and a long suture placed laterally for orientation purposes.  After removal, I did excise some more hardened tissue from the intercostal muscle at the deep margin.  This was sent separately to pathology.  A bleeding was controlled using Bovie electrocautery.  0.5% Sensorcaine was instilled into the surrounding wound.  The subcutaneous layer was reapproximated using a 3-0 Vicryl interrupted suture.  The skin was closed using a 4-0 Monocryl subcuticular suture.  Dermabond was applied.  All tape and needle counts were correct at the end of the procedure.  The patient was awakened and transferred to PACU in stable condition.  Complications: None  EBL: Minimal  Specimen: Recurrent right breast cancer, deep margin

## 2023-01-18 NOTE — Interval H&P Note (Signed)
History and Physical Interval Note:  01/18/2023 7:10 AM  Megan [name]  [name] presented today for surgery, with the diagnosis of BREAST CANCER, RIGHT, RECURRENT.  The various methods of treatment have been discussed with the patient and family. After consideration of risks, benefits and other options for treatment, the patient [name] consented to  Procedure(s): EXCISION MASS, RIGHT CHEST S/P MASTECTOMY (Right) as a surgical intervention.  The patient's history [name] been reviewed, patient examined, no change in status, stable for surgery.  I have reviewed the patient's chart and labs.  Questions were answered to the patient's satisfaction.     Franky Macho

## 2023-01-18 NOTE — Anesthesia Preprocedure Evaluation (Signed)
Anesthesia Evaluation  Patient identified by MRN, date of birth, ID band Patient awake    Reviewed: Allergy & Precautions, H&P , NPO status , Patient's Chart, lab work & pertinent test results, reviewed documented beta blocker date and time   History of Anesthesia Complications (+) PONV and history of anesthetic complications  Airway Mallampati: II  TM Distance: >3 FB Neck ROM: full    Dental no notable dental hx.    Pulmonary neg pulmonary ROS, shortness of breath, pneumonia   Pulmonary exam normal breath sounds clear to auscultation       Cardiovascular Exercise Tolerance: Good hypertension, + CAD and +CHF  negative cardio ROS  Rhythm:regular Rate:Normal     Neuro/Psych  Headaches negative neurological ROS  negative psych ROS   GI/Hepatic negative GI ROS, Neg liver ROS,GERD  ,,  Endo/Other  negative endocrine ROSdiabetesHypothyroidism    Renal/GU ESRF and DialysisRenal diseasenegative Renal ROS  negative genitourinary   Musculoskeletal   Abdominal   Peds  Hematology negative hematology ROS (+) Blood dyscrasia, anemia   Anesthesia Other Findings   Reproductive/Obstetrics negative OB ROS                             Anesthesia Physical Anesthesia Plan  ASA: 3  Anesthesia Plan: General and General LMA   Post-op Pain Management:    Induction:   PONV Risk Score and Plan: Ondansetron  Airway Management Planned:   Additional Equipment:   Intra-op Plan:   Post-operative Plan:   Informed Consent: I have reviewed the patients History and Physical, chart, labs and discussed the procedure including the risks, benefits and alternatives for the proposed anesthesia with the patient or authorized representative who has indicated his/her understanding and acceptance.     Dental Advisory Given  Plan Discussed with: CRNA  Anesthesia Plan Comments:        Anesthesia Quick  Evaluation

## 2023-01-18 NOTE — Anesthesia Procedure Notes (Signed)
Procedure Name: LMA Insertion Date/Time: 01/18/2023 7:31 AM  Performed by: Lorin Glass, CRNAPre-anesthesia Checklist: Emergency Drugs available, Patient identified, Suction available and Patient being monitored Patient Re-evaluated:Patient Re-evaluated prior to induction Oxygen Delivery Method: Circle system utilized Preoxygenation: Pre-oxygenation with 100% oxygen Induction Type: IV induction LMA: LMA inserted LMA Size: 4.0 Number of attempts: 1 Placement Confirmation: positive ETCO2 and breath sounds checked- equal and bilateral Tube secured with: Tape Dental Injury: Teeth and Oropharynx as per pre-operative assessment

## 2023-01-18 NOTE — Transfer of Care (Signed)
Immediate Anesthesia Transfer of Care Note  Patient: Megan [name]  Procedure(s) Performed: EXCISION MASS, RIGHT CHEST S/P MASTECTOMY (Right: Breast)  Patient Location: PACU  Anesthesia Type:General  Level of Consciousness: drowsy  Airway & Oxygen Therapy: Patient Spontanous Breathing and Patient connected to nasal cannula oxygen  Post-op Assessment: Report given to RN and Post -op Vital signs reviewed and stable  Post vital signs: Reviewed and stable  Last Vitals:  Vitals Value Taken Time  BP 92/47   Temp 97.6   Pulse 74 01/18/23 0815  Resp 12   SpO2 93 % 01/18/23 0815  Vitals shown include unvalidated device data.  Last Pain:  Vitals:   01/18/23 0639  TempSrc: Oral  PainSc: 10-Worst pain ever         Complications: No notable events documented.

## 2023-01-19 NOTE — Anesthesia Postprocedure Evaluation (Signed)
Anesthesia Post Note  Patient: Megan [name]  Procedure(s) Performed: EXCISION MASS, RIGHT CHEST S/P MASTECTOMY (Right: Breast)  Patient location during evaluation: Phase II Anesthesia Type: General Level of consciousness: awake Pain management: pain level controlled Vital Signs Assessment: post-procedure vital signs reviewed and stable Respiratory status: spontaneous breathing and respiratory function stable Cardiovascular status: blood pressure returned to baseline and stable Postop Assessment: no headache and no apparent nausea or vomiting Anesthetic complications: no Comments: Late entry   No notable events documented.   Last Vitals:  Vitals:   01/18/23 0900 01/18/23 0917  BP: 137/77 127/74  Pulse: 74 70  Resp: 16 16  Temp:  (!) 36.3 C  SpO2: 100% 100%    Last Pain:  Vitals:   01/18/23 0917  TempSrc: Oral  PainSc: 0-No pain                 Windell Norfolk

## 2023-01-20 DIAGNOSIS — I159 Secondary hypertension, unspecified: Secondary | ICD-10-CM | POA: Diagnosis not present

## 2023-01-20 DIAGNOSIS — N186 End stage renal disease: Secondary | ICD-10-CM | POA: Diagnosis not present

## 2023-01-20 DIAGNOSIS — D631 Anemia in chronic kidney disease: Secondary | ICD-10-CM | POA: Diagnosis not present

## 2023-01-20 DIAGNOSIS — N2581 Secondary hyperparathyroidism of renal origin: Secondary | ICD-10-CM | POA: Diagnosis not present

## 2023-01-20 DIAGNOSIS — D509 Iron deficiency anemia, unspecified: Secondary | ICD-10-CM | POA: Diagnosis not present

## 2023-01-20 DIAGNOSIS — Z992 Dependence on renal dialysis: Secondary | ICD-10-CM | POA: Diagnosis not present

## 2023-01-22 DIAGNOSIS — N2581 Secondary hyperparathyroidism of renal origin: Secondary | ICD-10-CM | POA: Diagnosis not present

## 2023-01-22 DIAGNOSIS — D509 Iron deficiency anemia, unspecified: Secondary | ICD-10-CM | POA: Diagnosis not present

## 2023-01-22 DIAGNOSIS — N186 End stage renal disease: Secondary | ICD-10-CM | POA: Diagnosis not present

## 2023-01-22 DIAGNOSIS — Z992 Dependence on renal dialysis: Secondary | ICD-10-CM | POA: Diagnosis not present

## 2023-01-22 DIAGNOSIS — I159 Secondary hypertension, unspecified: Secondary | ICD-10-CM | POA: Diagnosis not present

## 2023-01-22 DIAGNOSIS — D631 Anemia in chronic kidney disease: Secondary | ICD-10-CM | POA: Diagnosis not present

## 2023-01-23 ENCOUNTER — Encounter (HOSPITAL_COMMUNITY): Payer: Self-pay | Admitting: General Surgery

## 2023-01-23 ENCOUNTER — Ambulatory Visit (INDEPENDENT_AMBULATORY_CARE_PROVIDER_SITE_OTHER): Payer: Medicare Other | Admitting: General Surgery

## 2023-01-23 VITALS — BP 133/80 | HR 80 | Temp 98.2°F | Resp 12 | Ht 61.0 in | Wt 183.0 lb

## 2023-01-23 DIAGNOSIS — Z09 Encounter for follow-up examination after completed treatment for conditions other than malignant neoplasm: Secondary | ICD-10-CM

## 2023-01-23 NOTE — Progress Notes (Signed)
Subjective:     Megan [name]  Patient here for postoperative visit, status post resection of recurrent right breast cancer.  Patient is doing well.  She [name] no pain. Objective:    BP 133/80   Pulse 80   Temp 98.2 F (36.8 C) (Oral)   Resp 12   Ht 5\' 1"  (1.549 m)   Wt 183 lb (83 kg)   SpO2 95%   BMI 34.58 kg/m   General:  alert, cooperative, and no distress  Right chest wall incision healing well.  No seroma or ecchymosis present. Final pathology reveals recurrent right breast cancer.  There was skeletal muscle present.  Involved margin deep and inferior.     Assessment:    Doing well postoperatively.    Plan:   I told the patient that there was involvement of the chest wall and I could not remove any further tumor because it was involving the intercostal skeletal muscle.  Will relay this to Dr. Ellin Saba.  Patient may need radiation therapy to this area.  Should any further resection need to be performed, may need thoracic surgery.  Follow-up here as needed.

## 2023-01-24 DIAGNOSIS — Z992 Dependence on renal dialysis: Secondary | ICD-10-CM | POA: Diagnosis not present

## 2023-01-24 DIAGNOSIS — N2581 Secondary hyperparathyroidism of renal origin: Secondary | ICD-10-CM | POA: Diagnosis not present

## 2023-01-24 DIAGNOSIS — D631 Anemia in chronic kidney disease: Secondary | ICD-10-CM | POA: Diagnosis not present

## 2023-01-24 DIAGNOSIS — I159 Secondary hypertension, unspecified: Secondary | ICD-10-CM | POA: Diagnosis not present

## 2023-01-24 DIAGNOSIS — N186 End stage renal disease: Secondary | ICD-10-CM | POA: Diagnosis not present

## 2023-01-24 DIAGNOSIS — D509 Iron deficiency anemia, unspecified: Secondary | ICD-10-CM | POA: Diagnosis not present

## 2023-01-25 ENCOUNTER — Encounter: Payer: Medicare Other | Admitting: General Surgery

## 2023-01-26 DIAGNOSIS — N186 End stage renal disease: Secondary | ICD-10-CM | POA: Diagnosis not present

## 2023-01-26 DIAGNOSIS — Z992 Dependence on renal dialysis: Secondary | ICD-10-CM | POA: Diagnosis not present

## 2023-01-26 DIAGNOSIS — N2581 Secondary hyperparathyroidism of renal origin: Secondary | ICD-10-CM | POA: Diagnosis not present

## 2023-01-26 DIAGNOSIS — D509 Iron deficiency anemia, unspecified: Secondary | ICD-10-CM | POA: Diagnosis not present

## 2023-01-26 DIAGNOSIS — I159 Secondary hypertension, unspecified: Secondary | ICD-10-CM | POA: Diagnosis not present

## 2023-01-26 DIAGNOSIS — D631 Anemia in chronic kidney disease: Secondary | ICD-10-CM | POA: Diagnosis not present

## 2023-01-29 DIAGNOSIS — I159 Secondary hypertension, unspecified: Secondary | ICD-10-CM | POA: Diagnosis not present

## 2023-01-29 DIAGNOSIS — N2581 Secondary hyperparathyroidism of renal origin: Secondary | ICD-10-CM | POA: Diagnosis not present

## 2023-01-29 DIAGNOSIS — N186 End stage renal disease: Secondary | ICD-10-CM | POA: Diagnosis not present

## 2023-01-29 DIAGNOSIS — D631 Anemia in chronic kidney disease: Secondary | ICD-10-CM | POA: Diagnosis not present

## 2023-01-29 DIAGNOSIS — D509 Iron deficiency anemia, unspecified: Secondary | ICD-10-CM | POA: Diagnosis not present

## 2023-01-29 DIAGNOSIS — Z992 Dependence on renal dialysis: Secondary | ICD-10-CM | POA: Diagnosis not present

## 2023-01-31 DIAGNOSIS — Z992 Dependence on renal dialysis: Secondary | ICD-10-CM | POA: Diagnosis not present

## 2023-01-31 DIAGNOSIS — N2581 Secondary hyperparathyroidism of renal origin: Secondary | ICD-10-CM | POA: Diagnosis not present

## 2023-01-31 DIAGNOSIS — D631 Anemia in chronic kidney disease: Secondary | ICD-10-CM | POA: Diagnosis not present

## 2023-01-31 DIAGNOSIS — N186 End stage renal disease: Secondary | ICD-10-CM | POA: Diagnosis not present

## 2023-01-31 DIAGNOSIS — I159 Secondary hypertension, unspecified: Secondary | ICD-10-CM | POA: Diagnosis not present

## 2023-01-31 DIAGNOSIS — D509 Iron deficiency anemia, unspecified: Secondary | ICD-10-CM | POA: Diagnosis not present

## 2023-02-02 DIAGNOSIS — I159 Secondary hypertension, unspecified: Secondary | ICD-10-CM | POA: Diagnosis not present

## 2023-02-02 DIAGNOSIS — D509 Iron deficiency anemia, unspecified: Secondary | ICD-10-CM | POA: Diagnosis not present

## 2023-02-02 DIAGNOSIS — Z992 Dependence on renal dialysis: Secondary | ICD-10-CM | POA: Diagnosis not present

## 2023-02-02 DIAGNOSIS — N2581 Secondary hyperparathyroidism of renal origin: Secondary | ICD-10-CM | POA: Diagnosis not present

## 2023-02-02 DIAGNOSIS — D631 Anemia in chronic kidney disease: Secondary | ICD-10-CM | POA: Diagnosis not present

## 2023-02-02 DIAGNOSIS — N186 End stage renal disease: Secondary | ICD-10-CM | POA: Diagnosis not present

## 2023-02-05 DIAGNOSIS — N186 End stage renal disease: Secondary | ICD-10-CM | POA: Diagnosis not present

## 2023-02-05 DIAGNOSIS — N2581 Secondary hyperparathyroidism of renal origin: Secondary | ICD-10-CM | POA: Diagnosis not present

## 2023-02-05 DIAGNOSIS — I159 Secondary hypertension, unspecified: Secondary | ICD-10-CM | POA: Diagnosis not present

## 2023-02-05 DIAGNOSIS — D509 Iron deficiency anemia, unspecified: Secondary | ICD-10-CM | POA: Diagnosis not present

## 2023-02-05 DIAGNOSIS — D631 Anemia in chronic kidney disease: Secondary | ICD-10-CM | POA: Diagnosis not present

## 2023-02-05 DIAGNOSIS — Z992 Dependence on renal dialysis: Secondary | ICD-10-CM | POA: Diagnosis not present

## 2023-02-07 DIAGNOSIS — Z992 Dependence on renal dialysis: Secondary | ICD-10-CM | POA: Diagnosis not present

## 2023-02-07 DIAGNOSIS — D509 Iron deficiency anemia, unspecified: Secondary | ICD-10-CM | POA: Diagnosis not present

## 2023-02-07 DIAGNOSIS — D631 Anemia in chronic kidney disease: Secondary | ICD-10-CM | POA: Diagnosis not present

## 2023-02-07 DIAGNOSIS — I159 Secondary hypertension, unspecified: Secondary | ICD-10-CM | POA: Diagnosis not present

## 2023-02-07 DIAGNOSIS — N2581 Secondary hyperparathyroidism of renal origin: Secondary | ICD-10-CM | POA: Diagnosis not present

## 2023-02-07 DIAGNOSIS — N186 End stage renal disease: Secondary | ICD-10-CM | POA: Diagnosis not present

## 2023-02-09 DIAGNOSIS — Z992 Dependence on renal dialysis: Secondary | ICD-10-CM | POA: Diagnosis not present

## 2023-02-09 DIAGNOSIS — D509 Iron deficiency anemia, unspecified: Secondary | ICD-10-CM | POA: Diagnosis not present

## 2023-02-09 DIAGNOSIS — I159 Secondary hypertension, unspecified: Secondary | ICD-10-CM | POA: Diagnosis not present

## 2023-02-09 DIAGNOSIS — D631 Anemia in chronic kidney disease: Secondary | ICD-10-CM | POA: Diagnosis not present

## 2023-02-09 DIAGNOSIS — N186 End stage renal disease: Secondary | ICD-10-CM | POA: Diagnosis not present

## 2023-02-09 DIAGNOSIS — N2581 Secondary hyperparathyroidism of renal origin: Secondary | ICD-10-CM | POA: Diagnosis not present

## 2023-02-12 DIAGNOSIS — E1129 Type 2 diabetes mellitus with other diabetic kidney complication: Secondary | ICD-10-CM | POA: Diagnosis not present

## 2023-02-12 DIAGNOSIS — N186 End stage renal disease: Secondary | ICD-10-CM | POA: Diagnosis not present

## 2023-02-12 DIAGNOSIS — D509 Iron deficiency anemia, unspecified: Secondary | ICD-10-CM | POA: Diagnosis not present

## 2023-02-12 DIAGNOSIS — D631 Anemia in chronic kidney disease: Secondary | ICD-10-CM | POA: Diagnosis not present

## 2023-02-12 DIAGNOSIS — E119 Type 2 diabetes mellitus without complications: Secondary | ICD-10-CM | POA: Diagnosis not present

## 2023-02-12 DIAGNOSIS — N2581 Secondary hyperparathyroidism of renal origin: Secondary | ICD-10-CM | POA: Diagnosis not present

## 2023-02-12 DIAGNOSIS — Z992 Dependence on renal dialysis: Secondary | ICD-10-CM | POA: Diagnosis not present

## 2023-02-12 DIAGNOSIS — N040 Nephrotic syndrome with minor glomerular abnormality: Secondary | ICD-10-CM | POA: Diagnosis not present

## 2023-02-14 DIAGNOSIS — N2581 Secondary hyperparathyroidism of renal origin: Secondary | ICD-10-CM | POA: Diagnosis not present

## 2023-02-14 DIAGNOSIS — D509 Iron deficiency anemia, unspecified: Secondary | ICD-10-CM | POA: Diagnosis not present

## 2023-02-14 DIAGNOSIS — N186 End stage renal disease: Secondary | ICD-10-CM | POA: Diagnosis not present

## 2023-02-14 DIAGNOSIS — Z992 Dependence on renal dialysis: Secondary | ICD-10-CM | POA: Diagnosis not present

## 2023-02-14 DIAGNOSIS — E1129 Type 2 diabetes mellitus with other diabetic kidney complication: Secondary | ICD-10-CM | POA: Diagnosis not present

## 2023-02-14 DIAGNOSIS — D631 Anemia in chronic kidney disease: Secondary | ICD-10-CM | POA: Diagnosis not present

## 2023-02-16 DIAGNOSIS — N186 End stage renal disease: Secondary | ICD-10-CM | POA: Diagnosis not present

## 2023-02-16 DIAGNOSIS — Z992 Dependence on renal dialysis: Secondary | ICD-10-CM | POA: Diagnosis not present

## 2023-02-16 DIAGNOSIS — D631 Anemia in chronic kidney disease: Secondary | ICD-10-CM | POA: Diagnosis not present

## 2023-02-16 DIAGNOSIS — E1129 Type 2 diabetes mellitus with other diabetic kidney complication: Secondary | ICD-10-CM | POA: Diagnosis not present

## 2023-02-16 DIAGNOSIS — D509 Iron deficiency anemia, unspecified: Secondary | ICD-10-CM | POA: Diagnosis not present

## 2023-02-16 DIAGNOSIS — N2581 Secondary hyperparathyroidism of renal origin: Secondary | ICD-10-CM | POA: Diagnosis not present

## 2023-02-19 ENCOUNTER — Inpatient Hospital Stay: Payer: Medicare Other | Admitting: Hematology

## 2023-02-19 DIAGNOSIS — N186 End stage renal disease: Secondary | ICD-10-CM | POA: Diagnosis not present

## 2023-02-19 DIAGNOSIS — D509 Iron deficiency anemia, unspecified: Secondary | ICD-10-CM | POA: Diagnosis not present

## 2023-02-19 DIAGNOSIS — N2581 Secondary hyperparathyroidism of renal origin: Secondary | ICD-10-CM | POA: Diagnosis not present

## 2023-02-19 DIAGNOSIS — D631 Anemia in chronic kidney disease: Secondary | ICD-10-CM | POA: Diagnosis not present

## 2023-02-19 DIAGNOSIS — Z992 Dependence on renal dialysis: Secondary | ICD-10-CM | POA: Diagnosis not present

## 2023-02-19 DIAGNOSIS — E1129 Type 2 diabetes mellitus with other diabetic kidney complication: Secondary | ICD-10-CM | POA: Diagnosis not present

## 2023-02-20 ENCOUNTER — Encounter: Payer: Self-pay | Admitting: Hematology

## 2023-02-20 ENCOUNTER — Inpatient Hospital Stay: Payer: Medicare Other | Attending: Hematology | Admitting: Hematology

## 2023-02-20 VITALS — BP 120/78 | HR 86 | Temp 98.2°F | Resp 18 | Wt 186.0 lb

## 2023-02-20 DIAGNOSIS — Z992 Dependence on renal dialysis: Secondary | ICD-10-CM | POA: Insufficient documentation

## 2023-02-20 DIAGNOSIS — Z79811 Long term (current) use of aromatase inhibitors: Secondary | ICD-10-CM | POA: Diagnosis not present

## 2023-02-20 DIAGNOSIS — I132 Hypertensive heart and chronic kidney disease with heart failure and with stage 5 chronic kidney disease, or end stage renal disease: Secondary | ICD-10-CM | POA: Diagnosis not present

## 2023-02-20 DIAGNOSIS — E1122 Type 2 diabetes mellitus with diabetic chronic kidney disease: Secondary | ICD-10-CM | POA: Diagnosis not present

## 2023-02-20 DIAGNOSIS — R0789 Other chest pain: Secondary | ICD-10-CM | POA: Insufficient documentation

## 2023-02-20 DIAGNOSIS — E559 Vitamin D deficiency, unspecified: Secondary | ICD-10-CM | POA: Insufficient documentation

## 2023-02-20 DIAGNOSIS — C50811 Malignant neoplasm of overlapping sites of right female breast: Secondary | ICD-10-CM | POA: Diagnosis not present

## 2023-02-20 DIAGNOSIS — C50211 Malignant neoplasm of upper-inner quadrant of right female breast: Secondary | ICD-10-CM

## 2023-02-20 DIAGNOSIS — N186 End stage renal disease: Secondary | ICD-10-CM | POA: Diagnosis not present

## 2023-02-20 DIAGNOSIS — Z9011 Acquired absence of right breast and nipple: Secondary | ICD-10-CM | POA: Insufficient documentation

## 2023-02-20 DIAGNOSIS — Z79899 Other long term (current) drug therapy: Secondary | ICD-10-CM | POA: Diagnosis not present

## 2023-02-20 DIAGNOSIS — Z923 Personal history of irradiation: Secondary | ICD-10-CM | POA: Diagnosis not present

## 2023-02-20 DIAGNOSIS — Z170 Estrogen receptor positive status [ER+]: Secondary | ICD-10-CM | POA: Insufficient documentation

## 2023-02-20 DIAGNOSIS — M8580 Other specified disorders of bone density and structure, unspecified site: Secondary | ICD-10-CM | POA: Diagnosis not present

## 2023-02-20 NOTE — Progress Notes (Signed)
Megan [name] [address] [place] [address]    Clinic Day:  02/20/2023  Referring physician: Donita Brooks, [name]  Patient Care Team: Donita Brooks, [name] as PCP - General (Family Medicine) Megan Mood [name], [name] as PCP - Cardiology (Cardiology) [name], [name] (Gastroenterology) [name], [name] as Consulting Physician (Cardiology) Megan [name] as Medical Oncologist (Medical Oncology) [name], [name] as Consulting Physician (Nephrology)   ASSESSMENT & PLAN:   Assessment: 1.  Stage Ib (T1CN0) right breast cancer, ER positive, PR and HER-2 negative: -Right lumpectomy and sentinel lymph node biopsy on 09/18/2018 shows 1.5 cm IDC, grade 3, associated with high-grade DCIS, negative margins, 0/3 lymph nodes positive, ER 50%, PR negative and HER-2 negative, Ki-67 60%. -Oncotype DX recurrence score 47.  Distant recurrence at 9 years with tamoxifen alone is 36%.  Absolute chemotherapy benefit is more than 15%. -Adjuvant chemotherapy with 4 cycles of AC from 10/30/2018 through 01/01/2019. -She was started on anastrozole in June 2020. -Mammogram on 09/02/2019 showed calcifications in the posterior aspect of upper outer quadrant of the right breast. -Right partial mastectomy on 02/18/2020 shows high-grade DCIS, 1.5 cm, no invasive carcinoma, resection margins negative.  0/11 lymph nodes.  ER 30% positive, PR negative. -Left simple mastectomy on 06/15/2020 was benign.  Right breast biopsy shows microscopic focus of invasive ductal carcinoma in the background of extensive high-grade DCIS. -Right breast lumpectomy on 07/13/2020 shows multifocal invasive ductal carcinoma, grade 3, largest measuring 1.2 cm.  Extensive high-grade DCIS with necrosis.  Margins are free.  PT1CNX.  There are multiple foci of invasive carcinoma arising from extensive DCIS.  Several small foci of invasive tumor arising from DCIS indicating new primaries.  ER/PR/HER-2 is negative.   Ki-67 is 20%. -PET scan on 09/14/2020 shows areas of nodularity with discrete nodule in the fat of the inferior chest wall within the subcutaneous tissues. -Ultrasound of the right chest wall shows masslike abnormality in the upper inner aspect of the right anterior chest at 1 o'clock position measuring 4.1 cm in greatest dimension.  1.7 cm hypoechoic mass in the central anterior right chest concerning for malignancy.  Possible borderline enlarged residual right axillary lymph node. -Right breast biopsy on 10/12/2020 shows 1.3 cm invasive ductal carcinoma, focally 0.1 cm from superior margin.  DCIS is less than 0.1 cm from superior margin.  PT1CPNX. - Right mastectomy followed by radiation therapy completed in May 2022. - PET scan (11/30/2022): Recurrent/metastatic disease within the right medial pectoralis musculature. - Biopsy (12/19/2022): Metastatic breast adenocarcinoma.  ER positive, 40%, weak staining.  PR negative.  Ki-67 30%.  HER2 IHC 0.   2.  Osteopenia: -Bone density on 01/20/2019 shows T score -1.9. - DEXA scan (03/24/2021) T score -2.4    Plan: 1.  Recurrent right breast cancer, ER 40%, weak staining, PR negative, HER2 negative: - She underwent resection of the right chest wall mass on 01/18/2023. - We reviewed pathology report: 1.7 cm IDC, grade 3.  DCIS high-grade with necrosis.  Carcinoma involves inked inferior margin with focally invading into atrophic skeletal muscle. - As the mass was involving intercostal muscle, it could not be completely removed. - We will reach out to Dr. Langston Masker to see if further radiation is feasible.  She continues to have pain. - If no further radiation feasible, will seek out consultation from CT surgery. - I have also talked about switching her therapy from anastrozole to combination of Faslodex and CDK 4 inhibitor.  Ribociclib is not studied in dialysis patients.  Palbociclib dose adjustment is not necessary.  We will make a decision after I talk to Dr.  Langston Masker.   2.  Osteopenia/vitamin D deficiency: - She is taking Nephro-Vite.  Vitamin D is low.  Follow-up recommendations from Dr. Arrie Aran.   3.  ESRD on HD: - Continue HD on Monday/Wednesday/Friday under the direction of Dr. Arrie Aran.   4.  Neuropathy: - Continue gabapentin at bedtime.  5.  Chest wall pain: - She has sharp pain in the right chest wall at the incision site which radiates to the right arm and lasts up to 30 minutes.  It commonly occurs on hemodialysis days. - Continue oxycodone 5 mg as needed.  She reports that oxycodone helps bring the pain from 10 to 4-5.    No orders of the defined types were placed in this encounter.     [name],acting as a Neurosurgeon for Megan [name].,have documented all relevant documentation on the behalf of Megan [name],as directed by  Megan [name] while in the presence of Megan [name].  I, Megan Massed [name], have reviewed the above documentation for accuracy and completeness, and I agree with the above.    Megan [name]   7/9/20246:05 PM  CHIEF COMPLAINT:   Diagnosis: recurrent right breast cancer    Cancer Staging  Malignant neoplasm of upper-inner quadrant of right female breast [facility]) Staging form: Breast, AJCC 8th Edition - Clinical stage from 09/26/2018: Stage IB (cT1c, cN0, cM0, G3, ER+, PR-, HER2-) - Signed by Megan [name] on 09/26/2018    Prior Therapy: 1. Right lumpectomy and sentinel lymph node biopsy on 09/18/2018  2. Adjuvant chemotherapy with 4 cycles of AC from 10/30/2018 through 01/01/2019  3. Right partial mastectomy on 02/18/2020  4. Right breast lumpectomy on 07/13/2020  5. Right mastectomy followed by radiation therapy completed in May 2022   Current Therapy:  anastrozole started June 2020    HISTORY OF PRESENT ILLNESS:   Oncology History  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)  09/18/2018 Initial  Diagnosis   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)   09/26/2018 Cancer Staging   Staging form: Breast, AJCC 8th Edition - Clinical stage from 09/26/2018: Stage IB (cT1c, cN0, cM0, G3, ER+, PR-, HER2-) - Signed by Megan [name] on 09/26/2018   10/30/2018 - 01/01/2019 Chemotherapy   Patient is on Treatment Plan : BREAST Adjuvant AC q21d        INTERVAL HISTORY:   Megan [name] is a 62 y.o. female presenting to clinic today for follow up of recurrent right breast cancer. She was last seen by me on 01/16/23.  Since our last visit, patient underwent a successful right chest s/p mastectomy for an excision of a subcutaneous mass with breast cancer.   Today, she states that she is doing well overall. Her appetite level is at 100%. Her energy level is at 50%.  PAST MEDICAL HISTORY:   Past Medical History: Past Medical History:  Diagnosis Date   (HFpEF) heart failure with preserved ejection fraction (HCC)    a. 01/2019 Echo: EF 55-60%, mild conc LVH. DD.  Torn MV chordae.   Anemia    Atypical chest pain    a. 08/2018 MV: EF 59%, no ischemia; b. 02/2019 Cath: nonobs dzs.   Blood transfusion without reported diagnosis    Breast cancer (HCC) 10/12/2020   Cataract    ESRD (end stage  renal disease) on dialysis [facility])    a. HD T, T, S   Essential hypertension, benign    GERD (gastroesophageal reflux disease)    Headache    Hemorrhoids    Mixed hyperlipidemia    Morbid obesity (HCC)    Non-obstructive CAD (coronary artery disease)    a. 02/2019 CathL LM nl, LAD 41m, LCX nl, RCA 25p, EF 55-65%.   PONV (postoperative nausea and vomiting)    S/P colonoscopy Jan 2011   Dr. Elnoria Howard: sessile polyp (benign lymphoid), large hemorrhoids, repeat 5-10 years   Temporal arteritis (HCC)    Type 2 diabetes mellitus (HCC)    Wears glasses     Surgical History: Past Surgical History:  Procedure Laterality Date   ABDOMINAL HYSTERECTOMY     APPENDECTOMY     ARTERY BIOPSY N/A  05/09/2018   Procedure: RIGHT TEMPORAL ARTERY BIOPSY;  Surgeon: Jimmye Norman, [name];  Location: [facility] OR;  Service: General;  Laterality: N/A;   BREAST BIOPSY Right 06/15/2020   Procedure: RIGHT BREAST BIOPSY;  Surgeon: Franky Macho, [name];  Location: [facility] ORS;  Service: General;  Laterality: Right;   CATARACT EXTRACTION W/PHACO Left 02/09/2017   Procedure: CATARACT EXTRACTION PHACO AND INTRAOCULAR LENS PLACEMENT LEFT EYE;  Surgeon: Gemma Payor, [name];  Location: [facility] OR[facility];  Service: Ophthalmology;  Laterality: Left;  CDE: 4.89   CATARACT EXTRACTION W/PHACO Right 06/04/2017   Procedure: CATARACT EXTRACTION PHACO AND INTRAOCULAR LENS PLACEMENT (IOC);  Surgeon: Gemma Payor, [name];  Location: [facility];  Service: Ophthalmology;  Laterality: Right;  CDE: 4.12   CHOLECYSTECTOMY  09/29/2011   Procedure: LAPAROSCOPIC CHOLECYSTECTOMY;  Surgeon: Dalia Heading, [name];  Location: [facility] OR[facility];  Service: General;  Laterality: N/A;   COLONOSCOPY  08/2009   Dr. Elnoria Howard: sessile polyp (benign lymphoid), large hemorrhoids, repeat 5-10 years   COLONOSCOPY N/A 06/12/2016   prominent hemorrhoids   COLONOSCOPY WITH PROPOFOL N/A 06/16/2021   Procedure: COLONOSCOPY WITH PROPOFOL;  Surgeon: Corbin Ade, [name];  Location: [facility];  Service: Endoscopy;  Laterality: N/A;  9:30am (dialysis pt)   ESOPHAGOGASTRODUODENOSCOPY  09/05/2011   [id number] hiatal hernia; remainder of exam normal. No explanation for patient's abdominal pain with today's examination   ESOPHAGOGASTRODUODENOSCOPY N/A 12/17/2013   Dr. Jena Gauss: gastric erythema, erosion, mild chronic inflammation on path    ESOPHAGOGASTRODUODENOSCOPY (EGD) WITH PROPOFOL N/A 06/16/2021   Procedure: ESOPHAGOGASTRODUODENOSCOPY (EGD) WITH PROPOFOL;  Surgeon: Corbin Ade, [name];  Location: [facility];  Service: Endoscopy;  Laterality: N/A;   EXCISION OF BREAST BIOPSY Right 10/12/2020   Procedure: EXCISION OF RIGHT BREAST BIOPSY;  Surgeon: Franky Macho, [name];  Location: [facility];  Service: General;   Laterality: Right;   LAPAROSCOPIC APPENDECTOMY  09/29/2011   Procedure: APPENDECTOMY LAPAROSCOPIC;  Surgeon: Dalia Heading, [name];  Location: [facility];  Service: General;;  incidental appendectomy   LEFT HEART CATH AND CORONARY ANGIOGRAPHY N/A 02/28/2019   Procedure: LEFT HEART CATH AND CORONARY ANGIOGRAPHY;  Surgeon: Corky Crafts, [name];  Location: [facility];  Service: Cardiovascular;  Laterality: N/A;   MASS EXCISION Right 01/18/2023   Procedure: EXCISION MASS, RIGHT CHEST S/P MASTECTOMY;  Surgeon: Franky Macho, [name];  Location: [facility];  Service: General;  Laterality: Right;   MASTECTOMY MODIFIED RADICAL Right 02/18/2020   Procedure: MASTECTOMY MODIFIED RADICAL;  Surgeon: Franky Macho, [name];  Location: [facility];  Service: General;  Laterality: Right;   MASTECTOMY, PARTIAL Right 07/13/2020   Procedure: RIGHT PARTIAL MASTECTOMY;  Surgeon: Franky Macho, [name];  Location: [facility];  Service: General;  Laterality: Right;   PARTIAL MASTECTOMY WITH NEEDLE LOCALIZATION AND AXILLARY SENTINEL LYMPH NODE BX Right 09/18/2018   Procedure: RIGHT PARTIAL MASTECTOMY AFTER NEEDLE LOCALIZATION, SENTINEL LYMPH NODE BIOPSY RIGHT AXILLA;  Surgeon: Franky Macho, [name];  Location: [facility];  Service: General;  Laterality: Right;   POLYPECTOMY  06/16/2021   Procedure: POLYPECTOMY;  Surgeon: Corbin Ade, [name];  [facility];  Service: Endoscopy;;   SIMPLE MASTECTOMY WITH AXILLARY SENTINEL NODE BIOPSY Left 06/15/2020   Procedure: LEFT SIMPLE MASTECTOMY;  Surgeon: Franky Macho, [name];  Location: [facility];  Service: General;  Laterality: Left;    Social History: Social History   Socioeconomic History   Marital status: Married    Spouse name: Not on file   Number of children: Not on file   Years of education: Not on file   Highest education level: Not on file  Occupational History   Occupation: Food Corporate treasurer: [employer]  Tobacco Use   Smoking status: Never    Passive exposure: Never    Smokeless tobacco: Never  Vaping Use   Vaping Use: Never used  Substance and Sexual Activity   Alcohol use: No   Drug use: No   Sexual activity: Yes    Birth control/protection: Surgical  Other Topics Concern   Not on file  Social History Narrative   Works at [employer] in [place].    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   Also has to get items from high shelves-causes achy pain in shoulder area      Married.   Children are grown, out of house.   Social Determinants of Health   Financial Resource Strain: Low Risk  (10/10/2022)   Overall Financial Resource Strain (CARDIA)    Difficulty of Paying Living Expenses: Not hard at all  Food Insecurity: No Food Insecurity (10/10/2022)   Hunger Vital Sign    Worried About Running Out of Food in the Last Year: Never true    Ran Out of Food in the Last Year: Never true  Transportation Needs: No Transportation Needs (09/14/2022)   PRAPARE - Administrator, Civil Service (Medical): No    Lack of Transportation (Non-Medical): No  Physical Activity: Inactive (09/14/2022)   Exercise Vital Sign    Days of Exercise per Week: 0 days    Minutes of Exercise per Session: 0 min  Stress: No Stress Concern Present (09/14/2022)   [employer] of Occupational Health - Occupational Stress Questionnaire    Feeling of Stress : Not at all  Social Connections: Socially Integrated (09/14/2022)   Social Connection and Isolation Panel [NHANES]    Frequency of Communication with Friends and Family: More than three times a week    Frequency of Social Gatherings with Friends and Family: Three times a week    Attends Religious Services: More than 4 times per year    Active Member of Clubs or Organizations: Yes    Attends Banker Meetings: More than 4 times per year    Marital Status: Married  Catering manager Violence: Not At Risk (09/14/2022)   Humiliation, Afraid, Rape, and Kick questionnaire    Fear of Current or  Ex-Partner: No    Emotionally Abused: No    Physically Abused: No    Sexually Abused: No    Family History: Family History  Problem Relation Age of Onset   Hypertension Mother  Coronary artery disease Mother    Diabetes Mother    Hypertension Sister    Coronary artery disease Sister    Hypertension Brother    Heart attack Father    Hypertension Son    Heart attack Maternal Aunt    Hypertension Maternal Aunt    Diabetes Maternal Aunt    Heart attack Maternal Uncle    Hypertension Maternal Uncle    Diabetes Maternal Uncle    Heart attack Paternal Aunt    Hypertension Paternal Aunt    Diabetes Paternal Aunt    Heart attack Paternal Uncle    Hypertension Paternal Uncle    Diabetes Paternal Uncle    Heart attack Maternal Grandmother    Heart attack Maternal Grandfather    Heart attack Paternal Grandmother    Heart attack Paternal Grandfather    Colon cancer Neg Hx     Current Medications:  Current Outpatient Medications:    acetaminophen (TYLENOL) 500 MG tablet, Take 500-1,000 mg by mouth every 6 (six) hours as needed (pain.)., Disp: , Rfl:    amLODipine (NORVASC) 10 MG tablet, TAKE 1 TABLET BY MOUTH AT BEDTIME, Disp: 90 tablet, Rfl: 3   anastrozole (ARIMIDEX) 1 MG tablet, TAKE 1 TABLET BY MOUTH DAILY, Disp: 90 tablet, Rfl: 3   aspirin EC 81 MG tablet, Take 81 mg by mouth in the morning., Disp: , Rfl:    atorvastatin (LIPITOR) 20 MG tablet, Take 1 tablet (20 mg total) by mouth daily. TAKE 1 TABLET DAILY (Patient taking differently: Take 20 mg by mouth at bedtime. TAKE 1 TABLET DAILY), Disp: 90 tablet, Rfl: 3   B Complex-C-Zn-Folic Acid (DIALYVITE 800-ZINC 15) 0.8 MG TABS, Take 1 tablet by mouth in the morning., Disp: , Rfl:    cinacalcet (SENSIPAR) 30 MG tablet, Take 2 tablets (60 mg total) by mouth daily. (Patient taking differently: Take 30 mg by mouth daily. With a meal), Disp: 90 tablet, Rfl: 0   Darbepoetin Alfa (ARANESP, ALBUMIN FREE, IJ), every Monday, Wednesday, and  Friday with hemodialysis., Disp: , Rfl:    fluticasone (FLONASE) 50 MCG/ACT nasal spray, Place 2 sprays into both nostrils daily., Disp: 16 g, Rfl: 6   gabapentin (NEURONTIN) 300 MG capsule, TAKE 1 CAPSULE(300 MG) BY MOUTH AT BEDTIME, Disp: 90 capsule, Rfl: 1   iron sucrose (VENOFER) 20 MG/ML injection, Inject 50 mg into the vein once a week., Disp: , Rfl:    isosorbide mononitrate (IMDUR) 30 MG 24 hr tablet, Take 1.5 tablets (45 mg total) by mouth daily., Disp: 135 tablet, Rfl: 3   lanthanum (FOSRENOL) 1000 MG chewable tablet, Chew 2,000-3,000 mg by mouth See admin instructions. Take 3 tablets (3000 mg) by mouth with meals and take 2 tablets (2000 mg) with snacks, Disp: , Rfl:    lidocaine-prilocaine (EMLA) cream, Apply 1 application  topically every Monday, Wednesday, and Friday with hemodialysis., Disp: , Rfl:    meclizine (ANTIVERT) 25 MG tablet, TAKE 1 TABLET(25 MG) BY MOUTH THREE TIMES DAILY AS NEEDED FOR DIZZINESS, Disp: 30 tablet, Rfl: 1   nebivolol (BYSTOLIC) 10 MG tablet, Take 1 tablet (10 mg total) by mouth in the morning and at bedtime., Disp: 180 tablet, Rfl: 3   nitroGLYCERIN (NITROSTAT) 0.4 MG SL tablet, Place 1 tablet (0.4 mg total) under the tongue every 5 (five) minutes x 3 doses as needed for chest pain (if no relief after 3rd dose, proceed to ED or call 911)., Disp: 25 tablet, Rfl: 3   ondansetron (ZOFRAN) 4 MG tablet,  Take 1 tablet (4 mg total) by mouth every 8 (eight) hours as needed for nausea or vomiting., Disp: 10 tablet, Rfl: 0   oxyCODONE (ROXICODONE) 5 MG immediate release tablet, Take 1 tablet (5 mg total) by mouth every 4 (four) hours as needed for severe pain., Disp: 25 tablet, Rfl: 0   pantoprazole (PROTONIX) 40 MG tablet, TAKE 1 TABLET(40 MG) BY MOUTH DAILY, Disp: 90 tablet, Rfl: 0   sucralfate (CARAFATE) 1 GM/10ML suspension, Take 10 mLs (1 g total) by mouth with breakfast, with lunch, and with evening meal. (Patient taking differently: Take 1 g by mouth 3 (three) times  daily as needed (stomach pain.).), Disp: 420 mL, Rfl: 2   VITAMIN D PO, Take 0.25 mcg by mouth daily., Disp: , Rfl:    Allergies: No Known Allergies  REVIEW OF SYSTEMS:   Review of Systems  Constitutional:  Negative for chills, fatigue and fever.  HENT:   Negative for lump/mass, mouth sores, nosebleeds, sore throat and trouble swallowing.   Eyes:  Negative for eye problems.  Respiratory:  Negative for cough and shortness of breath.   Cardiovascular:  Negative for chest pain, leg swelling and palpitations.  Gastrointestinal:  Negative for abdominal pain, constipation, diarrhea, nausea and vomiting.  Genitourinary:  Negative for bladder incontinence, difficulty urinating, dysuria, frequency, hematuria and nocturia.   Musculoskeletal:  Negative for arthralgias, back pain, flank pain, myalgias and neck pain.       Right chest wall pain radiating to the right arm.  Skin:  Negative for itching and rash.  Neurological:  Positive for dizziness and numbness. Negative for headaches.  Hematological:  Does not bruise/bleed easily.  Psychiatric/Behavioral:  Negative for depression, sleep disturbance and suicidal ideas. The patient is not nervous/anxious.   All other systems reviewed and are negative.    VITALS:   Blood pressure 120/78, pulse 86, temperature 98.2 F (36.8 C), temperature source Oral, resp. rate 18, weight 186 lb (84.4 kg), SpO2 97 %.  Wt Readings from Last 3 Encounters:  02/20/23 186 lb (84.4 kg)  01/23/23 183 lb (83 kg)  01/18/23 181 lb 14.1 oz (82.5 kg)    Body mass index is 35.14 kg/m.  Performance status (ECOG): 1 - Symptomatic but completely ambulatory  PHYSICAL EXAM:   Physical Exam Vitals and nursing note reviewed. Exam conducted with a chaperone present.  Constitutional:      Appearance: Normal appearance.  Cardiovascular:     Rate and Rhythm: Normal rate and regular rhythm.     Pulses: Normal pulses.     Heart sounds: Normal heart sounds.  Pulmonary:      Effort: Pulmonary effort is normal.     Breath sounds: Normal breath sounds.  Abdominal:     Palpations: Abdomen is soft. There is no hepatomegaly, splenomegaly or mass.     Tenderness: There is no abdominal tenderness.  Musculoskeletal:     Right lower leg: No edema.     Left lower leg: No edema.  Lymphadenopathy:     Cervical: No cervical adenopathy.     Right cervical: No superficial, deep or posterior cervical adenopathy.    Left cervical: No superficial, deep or posterior cervical adenopathy.     Upper Body:     Right upper body: No supraclavicular or axillary adenopathy.     Left upper body: No supraclavicular or axillary adenopathy.  Neurological:     General: No focal deficit present.     Mental Status: She is alert and oriented to person,  place, and time.  Psychiatric:        Mood and Affect: Mood normal.        Behavior: Behavior normal.     LABS:      Latest Ref Rng & Units 01/18/2023    7:12 AM 12/26/2022    9:28 AM 12/19/2022    9:25 AM  CBC  WBC 4.0 - 10.5 K/uL  7.3  6.7   Hemoglobin 12.0 - 15.0 g/dL 16.1  09.6  04.5   Hematocrit 36.0 - 46.0 % 34.0  34.8  32.3   Platelets 150 - 400 K/uL  252  226       Latest Ref Rng & Units 01/18/2023    7:12 AM 12/26/2022    9:28 AM 09/12/2022   11:20 AM  CMP  Glucose 70 - 99 mg/dL 409  811  96   BUN 6 - 20 mg/dL 46  37  46   Creatinine 0.44 - 1.00 mg/dL 91.47  8.29  56.21   Sodium 135 - 145 mmol/L 138  138  138   Potassium 3.5 - 5.1 mmol/L 3.4  3.9  3.8   Chloride 98 - 111 mmol/L 97  95  96   CO2 22 - 32 mmol/L  29  27   Calcium 8.9 - 10.3 mg/dL  30.8  9.8   Total Protein 6.5 - 8.1 g/dL  7.7  7.3   Total Bilirubin 0.3 - 1.2 mg/dL  0.7  0.7   Alkaline Phos 38 - 126 U/L  93  90   AST 15 - 41 U/L  14  26   ALT 0 - 44 U/L  21  27      No results found for: "CEA1", "CEA" / No results found for: "CEA1", "CEA" No results found for: "PSA1" No results found for: "MVH846" No results found for: "CAN125"  No results found  for: "TOTALPROTELP", "ALBUMINELP", "A1GS", "A2GS", "BETS", "BETA2SER", "GAMS", "MSPIKE", "SPEI" Lab Results  Component Value Date   TIBC 195 (L) 02/15/2021   TIBC 190 (L) 12/10/2018   TIBC 218 (L) 06/08/2016   FERRITIN 1,742 (H) 02/15/2021   FERRITIN 1,308 (H) 12/10/2018   FERRITIN 126 06/08/2016   IRONPCTSAT 15 02/15/2021   IRONPCTSAT 43 (H) 12/10/2018   IRONPCTSAT 22 06/08/2016   No results found for: "LDH"   STUDIES:   No results found.

## 2023-02-20 NOTE — Progress Notes (Signed)
Patient is tolerating Tamoxifen without difficulty noted.

## 2023-02-20 NOTE — Patient Instructions (Addendum)
[facility]  Discharge Instructions  You were seen and examined today by Dr. Ellin Saba.  Dr. Ellin Saba discussed possibly changing up treatment depending on results from the further testing that he is going to order on your biopsy.  Dr. Ellin Saba is going to consult with Dr. Langston Masker concerning your pain to see if he can give radiation.  Follow-up as scheduled in 2 weeks.    Thank you for choosing [facility] to provide your oncology and hematology care.   To afford each patient quality time with our provider, please arrive at least 15 minutes before your scheduled appointment time. You may need to reschedule your appointment if you arrive late (10 or more minutes). Arriving late affects you and other patients whose appointments are after yours.  Also, if you miss three or more appointments without notifying the office, you may be dismissed from the clinic at the provider's discretion.    Again, thank you for choosing [facility].  Our hope is that these requests will decrease the amount of time that you wait before being seen by our physicians.   If you have a lab appointment with the Cancer Center - please note that after April 8th, all labs will be drawn in the cancer center.  You do not have to check in or register with the main entrance as you have in the past but will complete your check-in at the cancer center.            _____________________________________________________________  Should you have questions after your visit to [facility], please contact our office at [phone] and follow the prompts.  Our office hours are 8:00 a.m. to 4:30 p.m. Monday - Thursday and 8:00 a.m. to 2:30 p.m. Friday.  Please note that voicemails left after 4:00 p.m. may not be returned until the following business day.  We are closed weekends and all major holidays.  You do have access to a nurse 24-7, just call the  main number to the clinic [phone] and do not press any options, hold on the line and a nurse will answer the phone.    For prescription refill requests, have your pharmacy contact our office and allow 72 hours.    Masks are no longer required in the cancer centers. If you would like for your care team to wear a mask while they are taking care of you, please let them know. You may have one support person who is at least 61 years old accompany you for your appointments.

## 2023-02-21 DIAGNOSIS — E1129 Type 2 diabetes mellitus with other diabetic kidney complication: Secondary | ICD-10-CM | POA: Diagnosis not present

## 2023-02-21 DIAGNOSIS — D631 Anemia in chronic kidney disease: Secondary | ICD-10-CM | POA: Diagnosis not present

## 2023-02-21 DIAGNOSIS — N2581 Secondary hyperparathyroidism of renal origin: Secondary | ICD-10-CM | POA: Diagnosis not present

## 2023-02-21 DIAGNOSIS — N186 End stage renal disease: Secondary | ICD-10-CM | POA: Diagnosis not present

## 2023-02-21 DIAGNOSIS — Z992 Dependence on renal dialysis: Secondary | ICD-10-CM | POA: Diagnosis not present

## 2023-02-21 DIAGNOSIS — D509 Iron deficiency anemia, unspecified: Secondary | ICD-10-CM | POA: Diagnosis not present

## 2023-02-22 ENCOUNTER — Telehealth: Payer: Self-pay | Admitting: *Deleted

## 2023-02-22 NOTE — Telephone Encounter (Signed)
Patient has chest wall recurrence on the right side from her breast cancer. Dr. Lovell Sheehan tried to resect it but could not remove it completely as it was in the intercostal muscles. Dr. Ellin Saba has talked to Dr. Langston Masker and are both in agreeable that she merits consultation with Dr. Dorris Fetch to see if he can remove it completely. Dr. Ellin Saba has consulted Dr. Dorris Fetch. I have touched base with her and advised that she should expect receive phone call from Dr. Sunday Corn office and would need to see him for consultation to get his opinion regarding surgical resection. If Dr. Dorris Fetch does not think she is a surgical candidate, she will be offered reirradiation. Patient verbalized understanding.

## 2023-02-23 DIAGNOSIS — D631 Anemia in chronic kidney disease: Secondary | ICD-10-CM | POA: Diagnosis not present

## 2023-02-23 DIAGNOSIS — D509 Iron deficiency anemia, unspecified: Secondary | ICD-10-CM | POA: Diagnosis not present

## 2023-02-23 DIAGNOSIS — N186 End stage renal disease: Secondary | ICD-10-CM | POA: Diagnosis not present

## 2023-02-23 DIAGNOSIS — E1129 Type 2 diabetes mellitus with other diabetic kidney complication: Secondary | ICD-10-CM | POA: Diagnosis not present

## 2023-02-23 DIAGNOSIS — N2581 Secondary hyperparathyroidism of renal origin: Secondary | ICD-10-CM | POA: Diagnosis not present

## 2023-02-23 DIAGNOSIS — Z992 Dependence on renal dialysis: Secondary | ICD-10-CM | POA: Diagnosis not present

## 2023-02-23 LAB — SURGICAL PATHOLOGY

## 2023-02-26 DIAGNOSIS — E1129 Type 2 diabetes mellitus with other diabetic kidney complication: Secondary | ICD-10-CM | POA: Diagnosis not present

## 2023-02-26 DIAGNOSIS — D631 Anemia in chronic kidney disease: Secondary | ICD-10-CM | POA: Diagnosis not present

## 2023-02-26 DIAGNOSIS — N186 End stage renal disease: Secondary | ICD-10-CM | POA: Diagnosis not present

## 2023-02-26 DIAGNOSIS — Z992 Dependence on renal dialysis: Secondary | ICD-10-CM | POA: Diagnosis not present

## 2023-02-26 DIAGNOSIS — N2581 Secondary hyperparathyroidism of renal origin: Secondary | ICD-10-CM | POA: Diagnosis not present

## 2023-02-26 DIAGNOSIS — D509 Iron deficiency anemia, unspecified: Secondary | ICD-10-CM | POA: Diagnosis not present

## 2023-02-28 ENCOUNTER — Other Ambulatory Visit: Payer: Self-pay

## 2023-02-28 ENCOUNTER — Ambulatory Visit: Payer: Medicare Other | Attending: Cardiology | Admitting: Cardiology

## 2023-02-28 ENCOUNTER — Encounter: Payer: Self-pay | Admitting: Cardiology

## 2023-02-28 VITALS — BP 120/70 | HR 91 | Ht 61.0 in | Wt 183.0 lb

## 2023-02-28 DIAGNOSIS — E782 Mixed hyperlipidemia: Secondary | ICD-10-CM | POA: Insufficient documentation

## 2023-02-28 DIAGNOSIS — I10 Essential (primary) hypertension: Secondary | ICD-10-CM | POA: Diagnosis present

## 2023-02-28 DIAGNOSIS — D509 Iron deficiency anemia, unspecified: Secondary | ICD-10-CM | POA: Diagnosis not present

## 2023-02-28 DIAGNOSIS — R0789 Other chest pain: Secondary | ICD-10-CM | POA: Insufficient documentation

## 2023-02-28 DIAGNOSIS — N186 End stage renal disease: Secondary | ICD-10-CM | POA: Diagnosis not present

## 2023-02-28 DIAGNOSIS — D631 Anemia in chronic kidney disease: Secondary | ICD-10-CM | POA: Diagnosis not present

## 2023-02-28 DIAGNOSIS — Z992 Dependence on renal dialysis: Secondary | ICD-10-CM | POA: Diagnosis not present

## 2023-02-28 DIAGNOSIS — E1129 Type 2 diabetes mellitus with other diabetic kidney complication: Secondary | ICD-10-CM | POA: Diagnosis not present

## 2023-02-28 DIAGNOSIS — Z170 Estrogen receptor positive status [ER+]: Secondary | ICD-10-CM

## 2023-02-28 DIAGNOSIS — N2581 Secondary hyperparathyroidism of renal origin: Secondary | ICD-10-CM | POA: Diagnosis not present

## 2023-02-28 NOTE — Progress Notes (Signed)
MRI chest ordered per verbal order from Dr. Ellin Saba.

## 2023-02-28 NOTE — Progress Notes (Signed)
Clinical Summary Megan [name] is a 62 y.o.female seen today for follow up of the following medical problems.      1.Chest pain - long history of chest pain  2018 nuclear stress: normal perfusion Jan 2020 nuclear stress no ischemia - 02/2019 cath: prox RCA 25%, mid LAD 10%, otherwise normal vessels   ER visit 11/17/21 with chest pain - trop neg x2, EKG SR without ischemic changes - pressure midchest. 6/10 in severity. Can occur at rest or with activity. No other associated symptoms. Lasts few seconds. Not positional. Not related to eating. - similar to prior chest pains.     ER visit 02/2022 with headache, chest pain -trops neg x 2.  - initially chest pains had improved on imudr, but recurrence - pcp increased imdur to 30mg  daily.      ER visit jan 2024 with chest pain - trops neg, EKG SR no specific ischemic changes -chronic chest pains similar to prior symptoms - at home at rest onset of pain, started right sided aching pain. Very severe pain, some SOB. Lightheaded. Pain not positional. Lasted a few hours. Pain improved with fentanyl   Right sdied pain at recent surgical site for recurrent breast cancer, no other symptoms -      2.Marland Kitchen HTN - she is compliant with meds - no issues with bp during HD     3. ESRD  4. Breast cancer - from notes recurrent breast CA, had chest wall cancer excision surgery 01/18/23 Ongoing pain, pending MRI and possible repeat surgery with CT surgery  5. Hyperlipidemia - reports recent labs at HD  Past Medical History:  Diagnosis Date   (HFpEF) heart failure with preserved ejection fraction (HCC)    a. 01/2019 Echo: EF 55-60%, mild conc LVH. DD.  Torn MV chordae.   Anemia    Atypical chest pain    a. 08/2018 MV: EF 59%, no ischemia; b. 02/2019 Cath: nonobs dzs.   Blood transfusion without reported diagnosis    Breast cancer (HCC) 10/12/2020   Cataract    ESRD (end stage renal disease) on dialysis [facility])    a. HD T, T, S   Essential  hypertension, benign    GERD (gastroesophageal reflux disease)    Headache    Hemorrhoids    Mixed hyperlipidemia    Morbid obesity (HCC)    Non-obstructive CAD (coronary artery disease)    a. 02/2019 CathL LM nl, LAD 48m, LCX nl, RCA 25p, EF 55-65%.   PONV (postoperative nausea and vomiting)    S/P colonoscopy Jan 2011   Dr. Elnoria Howard: sessile polyp (benign lymphoid), large hemorrhoids, repeat 5-10 years   Temporal arteritis (HCC)    Type 2 diabetes mellitus (HCC)    Wears glasses      No Known Allergies   Current Outpatient Medications  Medication Sig Dispense Refill   acetaminophen (TYLENOL) 500 MG tablet Take 500-1,000 mg by mouth every 6 (six) hours as needed (pain.).     amLODipine (NORVASC) 10 MG tablet TAKE 1 TABLET BY MOUTH AT BEDTIME 90 tablet 3   anastrozole (ARIMIDEX) 1 MG tablet TAKE 1 TABLET BY MOUTH DAILY 90 tablet 3   aspirin EC 81 MG tablet Take 81 mg by mouth in the morning.     atorvastatin (LIPITOR) 20 MG tablet Take 1 tablet (20 mg total) by mouth daily. TAKE 1 TABLET DAILY (Patient taking differently: Take 20 mg by mouth at bedtime. TAKE 1 TABLET DAILY) 90 tablet 3  B Complex-C-Zn-Folic Acid (DIALYVITE 800-ZINC 15) 0.8 MG TABS Take 1 tablet by mouth in the morning.     cinacalcet (SENSIPAR) 30 MG tablet Take 2 tablets (60 mg total) by mouth daily. (Patient taking differently: Take 30 mg by mouth daily. With a meal) 90 tablet 0   Darbepoetin Alfa (ARANESP, ALBUMIN FREE, IJ) every Monday, Wednesday, and Friday with hemodialysis.     fluticasone (FLONASE) 50 MCG/ACT nasal spray Place 2 sprays into both nostrils daily. 16 g 6   gabapentin (NEURONTIN) 300 MG capsule TAKE 1 CAPSULE(300 MG) BY MOUTH AT BEDTIME 90 capsule 1   iron sucrose (VENOFER) 20 MG/ML injection Inject 50 mg into the vein once a week.     isosorbide mononitrate (IMDUR) 30 MG 24 hr tablet Take 1.5 tablets (45 mg total) by mouth daily. 135 tablet 3   lanthanum (FOSRENOL) 1000 MG chewable tablet Chew  2,000-3,000 mg by mouth See admin instructions. Take 3 tablets (3000 mg) by mouth with meals and take 2 tablets (2000 mg) with snacks     lidocaine-prilocaine (EMLA) cream Apply 1 application  topically every Monday, Wednesday, and Friday with hemodialysis.     meclizine (ANTIVERT) 25 MG tablet TAKE 1 TABLET(25 MG) BY MOUTH THREE TIMES DAILY AS NEEDED FOR DIZZINESS 30 tablet 1   nebivolol (BYSTOLIC) 10 MG tablet Take 1 tablet (10 mg total) by mouth in the morning and at bedtime. 180 tablet 3   nitroGLYCERIN (NITROSTAT) 0.4 MG SL tablet Place 1 tablet (0.4 mg total) under the tongue every 5 (five) minutes x 3 doses as needed for chest pain (if no relief after 3rd dose, proceed to ED or call 911). 25 tablet 3   ondansetron (ZOFRAN) 4 MG tablet Take 1 tablet (4 mg total) by mouth every 8 (eight) hours as needed for nausea or vomiting. 10 tablet 0   oxyCODONE (ROXICODONE) 5 MG immediate release tablet Take 1 tablet (5 mg total) by mouth every 4 (four) hours as needed for severe pain. 25 tablet 0   pantoprazole (PROTONIX) 40 MG tablet TAKE 1 TABLET(40 MG) BY MOUTH DAILY 90 tablet 0   sucralfate (CARAFATE) 1 GM/10ML suspension Take 10 mLs (1 g total) by mouth with breakfast, with lunch, and with evening meal. (Patient taking differently: Take 1 g by mouth 3 (three) times daily as needed (stomach pain.).) 420 mL 2   VITAMIN D PO Take 0.25 mcg by mouth daily.     No current facility-administered medications for this visit.     Past Surgical History:  Procedure Laterality Date   ABDOMINAL HYSTERECTOMY     APPENDECTOMY     ARTERY BIOPSY N/A 05/09/2018   Procedure: RIGHT TEMPORAL ARTERY BIOPSY;  Surgeon: Jimmye Norman, MD;  Location: [facility] OR;  Service: General;  Laterality: N/A;   BREAST BIOPSY Right 06/15/2020   Procedure: RIGHT BREAST BIOPSY;  Surgeon: Franky Macho, MD;  Location: [facility] ORS;  Service: General;  Laterality: Right;   CATARACT EXTRACTION W/PHACO Left 02/09/2017   Procedure: CATARACT  EXTRACTION PHACO AND INTRAOCULAR LENS PLACEMENT LEFT EYE;  Surgeon: Gemma Payor, MD;  Location: [facility] ORS;  Service: Ophthalmology;  Laterality: Left;  CDE: 4.89   CATARACT EXTRACTION W/PHACO Right 06/04/2017   Procedure: CATARACT EXTRACTION PHACO AND INTRAOCULAR LENS PLACEMENT (IOC);  Surgeon: Gemma Payor, MD;  Location: [facility] ORS;  Service: Ophthalmology;  Laterality: Right;  CDE: 4.12   CHOLECYSTECTOMY  09/29/2011   Procedure: LAPAROSCOPIC CHOLECYSTECTOMY;  Surgeon: Dalia Heading, MD;  Location: [facility] ORS;  Service: General;  Laterality: N/A;   COLONOSCOPY  08/2009   Dr. Elnoria Howard: sessile polyp (benign lymphoid), large hemorrhoids, repeat 5-10 years   COLONOSCOPY N/A 06/12/2016   prominent hemorrhoids   COLONOSCOPY WITH PROPOFOL N/A 06/16/2021   Procedure: COLONOSCOPY WITH PROPOFOL;  Surgeon: Corbin Ade, MD;  Location: [facility] END[facility] SU[facility];  Service: Endoscopy;  Laterality: N/A;  9:30am (dialysis pt)   ESOPHAGOGASTRODUODENOSCOPY  09/05/2011   HQI:ONGEX hiatal hernia; remainder of exam normal. No explanation for patient's abdominal pain with today's examination   ESOPHAGOGASTRODUODENOSCOPY N/A 12/17/2013   Dr. Jena Gauss: gastric erythema, erosion, mild chronic inflammation on path    ESOPHAGOGASTRODUODENOSCOPY (EGD) WITH PROPOFOL N/A 06/16/2021   Procedure: ESOPHAGOGASTRODUODENOSCOPY (EGD) WITH PROPOFOL;  Surgeon: Corbin Ade, MD;  [facility];  Service: Endoscopy;  Laterality: N/A;   EXCISION OF BREAST BIOPSY Right 10/12/2020   Procedure: EXCISION OF RIGHT BREAST BIOPSY;  Surgeon: Franky Macho, MD;  Location: [facility];  Service: General;  Laterality: Right;   LAPAROSCOPIC APPENDECTOMY  09/29/2011   Procedure: APPENDECTOMY LAPAROSCOPIC;  Surgeon: Dalia Heading, MD;  Location: [facility];  Service: General;;  incidental appendectomy   LEFT HEART CATH AND CORONARY ANGIOGRAPHY N/A 02/28/2019   Procedure: LEFT HEART CATH AND CORONARY ANGIOGRAPHY;  Surgeon: Corky Crafts, MD;  [facility] INVASIVE  CV LAB;  Service: Cardiovascular;  Laterality: N/A;   MASS EXCISION Right 01/18/2023   Procedure: EXCISION MASS, RIGHT CHEST S/P MASTECTOMY;  Surgeon: Franky Macho, MD;  Location: [facility];  Service: General;  Laterality: Right;   MASTECTOMY MODIFIED RADICAL Right 02/18/2020   Procedure: MASTECTOMY MODIFIED RADICAL;  Surgeon: Franky Macho, MD;  Location: [facility];  Service: General;  Laterality: Right;   MASTECTOMY, PARTIAL Right 07/13/2020   Procedure: RIGHT PARTIAL MASTECTOMY;  Surgeon: Franky Macho, MD;  Location: [facility];  Service: General;  Laterality: Right;   PARTIAL MASTECTOMY WITH NEEDLE LOCALIZATION AND AXILLARY SENTINEL LYMPH NODE BX Right 09/18/2018   Procedure: RIGHT PARTIAL MASTECTOMY AFTER NEEDLE LOCALIZATION, SENTINEL LYMPH NODE BIOPSY RIGHT AXILLA;  Surgeon: Franky Macho, MD;  Location: [facility];  Service: General;  Laterality: Right;   POLYPECTOMY  06/16/2021   Procedure: POLYPECTOMY;  Surgeon: Corbin Ade, MD;  [facility];  Service: Endoscopy;;   SIMPLE MASTECTOMY WITH AXILLARY SENTINEL NODE BIOPSY Left 06/15/2020   Procedure: LEFT SIMPLE MASTECTOMY;  Surgeon: Franky Macho, MD;  Location: [facility];  Service: General;  Laterality: Left;     No Known Allergies    Family History  Problem Relation Age of Onset   Hypertension Mother    Coronary artery disease Mother    Diabetes Mother    Hypertension Sister    Coronary artery disease Sister    Hypertension Brother    Heart attack Father    Hypertension Son    Heart attack Maternal Aunt    Hypertension Maternal Aunt    Diabetes Maternal Aunt    Heart attack Maternal Uncle    Hypertension Maternal Uncle    Diabetes Maternal Uncle    Heart attack Paternal Aunt    Hypertension Paternal Aunt    Diabetes Paternal Aunt    Heart attack Paternal Uncle    Hypertension Paternal Uncle    Diabetes Paternal Uncle    Heart attack Maternal Grandmother    Heart attack Maternal Grandfather    Heart attack Paternal  Grandmother    Heart attack Paternal Grandfather    Colon cancer Neg Hx      Social  History Ms. Paff reports that she has never smoked. She has never been exposed to tobacco smoke. She has never used smokeless tobacco. Ms. Gravois reports no history of alcohol use.   Review of Systems CONSTITUTIONAL: No weight loss, fever, chills, weakness or fatigue.  HEENT: Eyes: No visual loss, blurred vision, double vision or yellow sclerae.No hearing loss, sneezing, congestion, runny nose or sore throat.  SKIN: No rash or itching.  CARDIOVASCULAR: per hpi RESPIRATORY: No shortness of breath, cough or sputum.  GASTROINTESTINAL: No anorexia, nausea, vomiting or diarrhea. No abdominal pain or blood.  GENITOURINARY: No burning on urination, no polyuria NEUROLOGICAL: No headache, dizziness, syncope, paralysis, ataxia, numbness or tingling in the extremities. No change in bowel or bladder control.  MUSCULOSKELETAL: No muscle, back pain, joint pain or stiffness.  LYMPHATICS: No enlarged nodes. No history of splenectomy.  PSYCHIATRIC: No history of depression or anxiety.  ENDOCRINOLOGIC: No reports of sweating, cold or heat intolerance. No polyuria or polydipsia.  Marland Kitchen   Physical Examination Today's Vitals   02/28/23 1453  BP: 120/70  Pulse: 91  SpO2: 96%  Weight: 183 lb (83 kg)  Height: 5\' 1"  (1.549 m)   Body mass index is 34.58 kg/m.  Gen: resting comfortably, no acute distress HEENT: no scleral icterus, pupils equal round and reactive, no palptable cervical adenopathy,  CV: RRR, no m/rg, no jvd Resp: Clear to auscultation bilaterally GI: abdomen is soft, non-tender, non-distended, normal bowel sounds, no hepatosplenomegaly MSK: extremities are warm, no edema.  Skin: warm, no rash Neuro:  no focal deficits Psych: appropriate affect   Diagnostic Studies  NST: 08/2018 Nuclear stress EF: 59%. There was no ST segment deviation noted during stress. This is a low risk study. The  left ventricular ejection fraction is normal (55-65%).   Assessment and Plan  1.Chest pain - several year history of chest pain - ischemic evaluations have been negative including stress testing in 2018 and 2020 and cath in 2020 - symptoms improved with imdur - Suspect possibly vasospasm as etiology of symptoms given nitro responsiveness and lack of obstructive disease on prior testing.   - no recent symptoms, continue current meds   2. HTN =- she is at goal, continue current meds  3. Hyperlipidemia - request lipid panel, continue atorvastatin   F/u 1 year     Antoine Poche, M.D.

## 2023-02-28 NOTE — Patient Instructions (Signed)
Medication Instructions:  Your physician recommends that you continue on your current medications as directed. Please refer to the Current Medication list given to you today.   Labwork: None  Testing/Procedures: None  Follow-Up: Your physician recommends that you schedule a follow-up appointment in: 1 year. You will receive a reminder call in about months reminding you to schedule your appointment. If you don't receive this call, please contact our office.   Any Other Special Instructions Will Be Listed Below (If Applicable).  If you need a refill on your cardiac medications before your next appointment, please call your pharmacy.

## 2023-03-02 DIAGNOSIS — Z992 Dependence on renal dialysis: Secondary | ICD-10-CM | POA: Diagnosis not present

## 2023-03-02 DIAGNOSIS — D509 Iron deficiency anemia, unspecified: Secondary | ICD-10-CM | POA: Diagnosis not present

## 2023-03-02 DIAGNOSIS — E1129 Type 2 diabetes mellitus with other diabetic kidney complication: Secondary | ICD-10-CM | POA: Diagnosis not present

## 2023-03-02 DIAGNOSIS — N2581 Secondary hyperparathyroidism of renal origin: Secondary | ICD-10-CM | POA: Diagnosis not present

## 2023-03-02 DIAGNOSIS — N186 End stage renal disease: Secondary | ICD-10-CM | POA: Diagnosis not present

## 2023-03-02 DIAGNOSIS — D631 Anemia in chronic kidney disease: Secondary | ICD-10-CM | POA: Diagnosis not present

## 2023-03-04 ENCOUNTER — Ambulatory Visit (HOSPITAL_COMMUNITY)
Admission: RE | Admit: 2023-03-04 | Discharge: 2023-03-04 | Disposition: A | Discharge status: Home / Self Care | Payer: Medicare Other | Source: Ambulatory Visit | Attending: Hematology | Admitting: Hematology

## 2023-03-04 DIAGNOSIS — Z170 Estrogen receptor positive status [ER+]: Secondary | ICD-10-CM | POA: Insufficient documentation

## 2023-03-04 DIAGNOSIS — C50911 Malignant neoplasm of unspecified site of right female breast: Secondary | ICD-10-CM | POA: Diagnosis not present

## 2023-03-04 DIAGNOSIS — C50211 Malignant neoplasm of upper-inner quadrant of right female breast: Secondary | ICD-10-CM | POA: Diagnosis not present

## 2023-03-04 MED ORDER — GADOBUTROL 1 MMOL/ML IV SOLN
7.5000 mL | Freq: Once | INTRAVENOUS | Status: AC | PRN
  Administered 2023-03-04: 7.5 mL via INTRAVENOUS

## 2023-03-04 NOTE — Progress Notes (Signed)
Created in error

## 2023-03-05 ENCOUNTER — Ambulatory Visit (HOSPITAL_COMMUNITY): Payer: Medicare Other

## 2023-03-05 ENCOUNTER — Inpatient Hospital Stay (HOSPITAL_BASED_OUTPATIENT_CLINIC_OR_DEPARTMENT_OTHER): Payer: Medicare Other | Admitting: Hematology

## 2023-03-05 DIAGNOSIS — D509 Iron deficiency anemia, unspecified: Secondary | ICD-10-CM | POA: Diagnosis not present

## 2023-03-05 DIAGNOSIS — D631 Anemia in chronic kidney disease: Secondary | ICD-10-CM | POA: Diagnosis not present

## 2023-03-05 DIAGNOSIS — N2581 Secondary hyperparathyroidism of renal origin: Secondary | ICD-10-CM | POA: Diagnosis not present

## 2023-03-05 DIAGNOSIS — Z992 Dependence on renal dialysis: Secondary | ICD-10-CM | POA: Diagnosis not present

## 2023-03-05 DIAGNOSIS — N186 End stage renal disease: Secondary | ICD-10-CM | POA: Diagnosis not present

## 2023-03-05 DIAGNOSIS — E1129 Type 2 diabetes mellitus with other diabetic kidney complication: Secondary | ICD-10-CM | POA: Diagnosis not present

## 2023-03-05 NOTE — Patient Instructions (Addendum)
[facility]  Discharge Instructions  You were seen and examined today by Dr. Ellin Saba.  We will forward the results of your MRI to Dr. Dorris Fetch or surgical evaluation.  Follow-up as scheduled.  Thank you for choosing [facility] to provide your oncology and hematology care.   To afford each patient quality time with our provider, please arrive at least 15 minutes before your scheduled appointment time. You may need to reschedule your appointment if you arrive late (10 or more minutes). Arriving late affects you and other patients whose appointments are after yours.  Also, if you miss three or more appointments without notifying the office, you may be dismissed from the clinic at the provider's discretion.    Again, thank you for choosing [facility].  Our hope is that these requests will decrease the amount of time that you wait before being seen by our physicians.   If you have a lab appointment with the Cancer Center - please note that after April 8th, all labs will be drawn in the cancer center.  You do not have to check in or register with the main entrance as you have in the past but will complete your check-in at the cancer center.            _____________________________________________________________  Should you have questions after your visit to [facility], please contact our office at [phone] and follow the prompts.  Our office hours are 8:00 a.m. to 4:30 p.m. Monday - Thursday and 8:00 a.m. to 2:30 p.m. Friday.  Please note that voicemails left after 4:00 p.m. may not be returned until the following business day.  We are closed weekends and all major holidays.  You do have access to a nurse 24-7, just call the main number to the clinic [phone] and do not press any options, hold on the line and a nurse will answer the phone.    For prescription refill requests, have your pharmacy contact  our office and allow 72 hours.    Masks are no longer required in the cancer centers. If you would like for your care team to wear a mask while they are taking care of you, please let them know. You may have one support person who is at least 61 years old accompany you for your appointments.

## 2023-03-06 ENCOUNTER — Ambulatory Visit: Payer: Medicare Other | Admitting: Hematology

## 2023-03-07 DIAGNOSIS — E1129 Type 2 diabetes mellitus with other diabetic kidney complication: Secondary | ICD-10-CM | POA: Diagnosis not present

## 2023-03-07 DIAGNOSIS — Z992 Dependence on renal dialysis: Secondary | ICD-10-CM | POA: Diagnosis not present

## 2023-03-07 DIAGNOSIS — D509 Iron deficiency anemia, unspecified: Secondary | ICD-10-CM | POA: Diagnosis not present

## 2023-03-07 DIAGNOSIS — N186 End stage renal disease: Secondary | ICD-10-CM | POA: Diagnosis not present

## 2023-03-07 DIAGNOSIS — D631 Anemia in chronic kidney disease: Secondary | ICD-10-CM | POA: Diagnosis not present

## 2023-03-07 DIAGNOSIS — N2581 Secondary hyperparathyroidism of renal origin: Secondary | ICD-10-CM | POA: Diagnosis not present

## 2023-03-09 DIAGNOSIS — Z992 Dependence on renal dialysis: Secondary | ICD-10-CM | POA: Diagnosis not present

## 2023-03-09 DIAGNOSIS — N2581 Secondary hyperparathyroidism of renal origin: Secondary | ICD-10-CM | POA: Diagnosis not present

## 2023-03-09 DIAGNOSIS — D509 Iron deficiency anemia, unspecified: Secondary | ICD-10-CM | POA: Diagnosis not present

## 2023-03-09 DIAGNOSIS — N186 End stage renal disease: Secondary | ICD-10-CM | POA: Diagnosis not present

## 2023-03-09 DIAGNOSIS — D631 Anemia in chronic kidney disease: Secondary | ICD-10-CM | POA: Diagnosis not present

## 2023-03-09 DIAGNOSIS — E1129 Type 2 diabetes mellitus with other diabetic kidney complication: Secondary | ICD-10-CM | POA: Diagnosis not present

## 2023-03-12 DIAGNOSIS — Z992 Dependence on renal dialysis: Secondary | ICD-10-CM | POA: Diagnosis not present

## 2023-03-12 DIAGNOSIS — D631 Anemia in chronic kidney disease: Secondary | ICD-10-CM | POA: Diagnosis not present

## 2023-03-12 DIAGNOSIS — D509 Iron deficiency anemia, unspecified: Secondary | ICD-10-CM | POA: Diagnosis not present

## 2023-03-12 DIAGNOSIS — N186 End stage renal disease: Secondary | ICD-10-CM | POA: Diagnosis not present

## 2023-03-12 DIAGNOSIS — E1129 Type 2 diabetes mellitus with other diabetic kidney complication: Secondary | ICD-10-CM | POA: Diagnosis not present

## 2023-03-12 DIAGNOSIS — N2581 Secondary hyperparathyroidism of renal origin: Secondary | ICD-10-CM | POA: Diagnosis not present

## 2023-03-14 DIAGNOSIS — D631 Anemia in chronic kidney disease: Secondary | ICD-10-CM | POA: Diagnosis not present

## 2023-03-14 DIAGNOSIS — N186 End stage renal disease: Secondary | ICD-10-CM | POA: Diagnosis not present

## 2023-03-14 DIAGNOSIS — Z992 Dependence on renal dialysis: Secondary | ICD-10-CM | POA: Diagnosis not present

## 2023-03-14 DIAGNOSIS — D509 Iron deficiency anemia, unspecified: Secondary | ICD-10-CM | POA: Diagnosis not present

## 2023-03-14 DIAGNOSIS — N2581 Secondary hyperparathyroidism of renal origin: Secondary | ICD-10-CM | POA: Diagnosis not present

## 2023-03-14 DIAGNOSIS — E1129 Type 2 diabetes mellitus with other diabetic kidney complication: Secondary | ICD-10-CM | POA: Diagnosis not present

## 2023-03-15 ENCOUNTER — Other Ambulatory Visit: Payer: Self-pay | Admitting: Family Medicine

## 2023-03-15 DIAGNOSIS — N040 Nephrotic syndrome with minor glomerular abnormality: Secondary | ICD-10-CM | POA: Diagnosis not present

## 2023-03-15 DIAGNOSIS — Z992 Dependence on renal dialysis: Secondary | ICD-10-CM | POA: Diagnosis not present

## 2023-03-15 DIAGNOSIS — N186 End stage renal disease: Secondary | ICD-10-CM | POA: Diagnosis not present

## 2023-03-15 NOTE — Telephone Encounter (Signed)
Requested Prescriptions  Pending Prescriptions Disp Refills   atorvastatin (LIPITOR) 20 MG tablet [Pharmacy Med Name: ATORVASTATIN 20MG  TABLETS] 90 tablet 0    Sig: TAKE 1 TABLET(20 MG) BY MOUTH DAILY     Cardiovascular:  Antilipid - Statins Failed - 03/15/2023  3:33 AM      Failed - Valid encounter within last 12 months    Recent Outpatient Visits           1 year ago Medication care plan discussed with patient   [facility] Donita Brooks, MD   1 year ago Dyspnea, unspecified type   [facility] Donita Brooks, MD   2 years ago Clostridium difficile colitis   [facility] Donita Brooks, MD   2 years ago Clostridium difficile colitis   [facility] Donita Brooks, MD   2 years ago Clostridium difficile colitis   [facility] Donita Brooks, MD              Failed - Lipid Panel in normal range within the last 12 months    Cholesterol  Date Value Ref Range Status  11/25/2020 141 <200 mg/dL Final   LDL Cholesterol (Calc)  Date Value Ref Range Status  11/25/2020 72 mg/dL (calc) Final    Comment:    Reference range: <100 . Desirable range <100 mg/dL for primary prevention;   <70 mg/dL for patients with CHD or diabetic patients  with > or = 2 CHD risk factors. Marland Kitchen LDL-C is now calculated using the Martin-Hopkins  calculation, which is a validated novel method providing  better accuracy than the Friedewald equation in the  estimation of LDL-C.  Horald Pollen et al. Lenox Ahr. 6213;086(57): 2061-2068  (http://education.[facility].com/faq/FAQ164)    Direct LDL  Date Value Ref Range Status  03/02/2022 105 (H) <100 mg/dL Final    Comment:    Greatly elevated Triglycerides values (>1200 mg/dL) interfere with the dLDL assay. As no Triglycerides  testing was ordered, interpret results with caution. . Desirable range <100 mg/dL for primary prevention;   <70 mg/dL for patients with  CHD or diabetic patients  with > or = 2 CHD risk factors. Marland Kitchen    HDL  Date Value Ref Range Status  11/25/2020 55 > OR = 50 mg/dL Final   Triglycerides  Date Value Ref Range Status  11/25/2020 67 <150 mg/dL Final         Passed - Patient is not pregnant

## 2023-03-16 DIAGNOSIS — E1129 Type 2 diabetes mellitus with other diabetic kidney complication: Secondary | ICD-10-CM | POA: Diagnosis not present

## 2023-03-16 DIAGNOSIS — D631 Anemia in chronic kidney disease: Secondary | ICD-10-CM | POA: Diagnosis not present

## 2023-03-16 DIAGNOSIS — N186 End stage renal disease: Secondary | ICD-10-CM | POA: Diagnosis not present

## 2023-03-16 DIAGNOSIS — Z992 Dependence on renal dialysis: Secondary | ICD-10-CM | POA: Diagnosis not present

## 2023-03-16 DIAGNOSIS — N2581 Secondary hyperparathyroidism of renal origin: Secondary | ICD-10-CM | POA: Diagnosis not present

## 2023-03-16 DIAGNOSIS — E119 Type 2 diabetes mellitus without complications: Secondary | ICD-10-CM | POA: Diagnosis not present

## 2023-03-19 DIAGNOSIS — Z992 Dependence on renal dialysis: Secondary | ICD-10-CM | POA: Diagnosis not present

## 2023-03-19 DIAGNOSIS — D631 Anemia in chronic kidney disease: Secondary | ICD-10-CM | POA: Diagnosis not present

## 2023-03-19 DIAGNOSIS — N186 End stage renal disease: Secondary | ICD-10-CM | POA: Diagnosis not present

## 2023-03-19 DIAGNOSIS — N2581 Secondary hyperparathyroidism of renal origin: Secondary | ICD-10-CM | POA: Diagnosis not present

## 2023-03-19 DIAGNOSIS — E119 Type 2 diabetes mellitus without complications: Secondary | ICD-10-CM | POA: Diagnosis not present

## 2023-03-19 DIAGNOSIS — E1129 Type 2 diabetes mellitus with other diabetic kidney complication: Secondary | ICD-10-CM | POA: Diagnosis not present

## 2023-03-21 DIAGNOSIS — D631 Anemia in chronic kidney disease: Secondary | ICD-10-CM | POA: Diagnosis not present

## 2023-03-21 DIAGNOSIS — N186 End stage renal disease: Secondary | ICD-10-CM | POA: Diagnosis not present

## 2023-03-21 DIAGNOSIS — E1129 Type 2 diabetes mellitus with other diabetic kidney complication: Secondary | ICD-10-CM | POA: Diagnosis not present

## 2023-03-21 DIAGNOSIS — E119 Type 2 diabetes mellitus without complications: Secondary | ICD-10-CM | POA: Diagnosis not present

## 2023-03-21 DIAGNOSIS — N2581 Secondary hyperparathyroidism of renal origin: Secondary | ICD-10-CM | POA: Diagnosis not present

## 2023-03-21 DIAGNOSIS — Z992 Dependence on renal dialysis: Secondary | ICD-10-CM | POA: Diagnosis not present

## 2023-03-23 DIAGNOSIS — E1129 Type 2 diabetes mellitus with other diabetic kidney complication: Secondary | ICD-10-CM | POA: Diagnosis not present

## 2023-03-23 DIAGNOSIS — D631 Anemia in chronic kidney disease: Secondary | ICD-10-CM | POA: Diagnosis not present

## 2023-03-23 DIAGNOSIS — E119 Type 2 diabetes mellitus without complications: Secondary | ICD-10-CM | POA: Diagnosis not present

## 2023-03-23 DIAGNOSIS — N2581 Secondary hyperparathyroidism of renal origin: Secondary | ICD-10-CM | POA: Diagnosis not present

## 2023-03-23 DIAGNOSIS — N186 End stage renal disease: Secondary | ICD-10-CM | POA: Diagnosis not present

## 2023-03-23 DIAGNOSIS — Z992 Dependence on renal dialysis: Secondary | ICD-10-CM | POA: Diagnosis not present

## 2023-03-26 DIAGNOSIS — N2581 Secondary hyperparathyroidism of renal origin: Secondary | ICD-10-CM | POA: Diagnosis not present

## 2023-03-26 DIAGNOSIS — E1129 Type 2 diabetes mellitus with other diabetic kidney complication: Secondary | ICD-10-CM | POA: Diagnosis not present

## 2023-03-26 DIAGNOSIS — D631 Anemia in chronic kidney disease: Secondary | ICD-10-CM | POA: Diagnosis not present

## 2023-03-26 DIAGNOSIS — Z992 Dependence on renal dialysis: Secondary | ICD-10-CM | POA: Diagnosis not present

## 2023-03-26 DIAGNOSIS — N186 End stage renal disease: Secondary | ICD-10-CM | POA: Diagnosis not present

## 2023-03-26 DIAGNOSIS — E119 Type 2 diabetes mellitus without complications: Secondary | ICD-10-CM | POA: Diagnosis not present

## 2023-03-27 ENCOUNTER — Encounter: Payer: Self-pay | Admitting: Thoracic Surgery (Cardiothoracic Vascular Surgery)

## 2023-03-27 ENCOUNTER — Institutional Professional Consult (permissible substitution) (INDEPENDENT_AMBULATORY_CARE_PROVIDER_SITE_OTHER): Payer: Medicare Other | Admitting: Thoracic Surgery (Cardiothoracic Vascular Surgery)

## 2023-03-27 VITALS — BP 148/77 | HR 84 | Resp 20 | Ht 61.0 in | Wt 184.0 lb

## 2023-03-27 DIAGNOSIS — R222 Localized swelling, mass and lump, trunk: Secondary | ICD-10-CM | POA: Diagnosis not present

## 2023-03-27 DIAGNOSIS — Z853 Personal history of malignant neoplasm of breast: Secondary | ICD-10-CM | POA: Diagnosis not present

## 2023-03-27 NOTE — Progress Notes (Signed)
PCP is Donita Brooks, MD Referring Provider is [name], MD  Chief Complaint  Patient presents with   Chest wall mass    CT [place] BX 5/724/ PET Scan 11/30/22/ MR Brain 01/11/23    HPI: [name] for consultation regarding local recurrence of breast cancer involving chest wall.  [name] is a 62 year old woman with a past medical history significant for obesity, hypertension, end-stage renal disease on hemodialysis, nonobstructive coronary disease, HFpEF, type 2 diabetes not on medications, hyperlipidemia, and breast cancer.  She was diagnosed with stage Ib breast cancer in 2020.  She underwent lumpectomy and sentinel lymph node biopsy followed by chemotherapy.  She had a recurrence requiring a mastectomy in 2021.  She later had a left mastectomy.  Recently she presented with a lump in the right chest wall.  PET/CT showed that was hypermetabolic.  She saw Dr. Langston Masker who did not feel that radiation was appropriate since it was 2 years from her previous radiation.  Dr. Lovell Sheehan did a resection on 01/18/2023.  He noted there was abnormal tissue involving intercostal muscles at the deep margin.  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  She continues to have some pain in that area.  She intermittently has right sided chest pain that radiates to her right arm.  This often happens towards the end of dialysis.  She been evaluated previously and did not have any obstructive coronary disease.  She is followed by Dr. [name] who saw her recently.  Zubrod Score: At the time of surgery this patient's most appropriate activity status/level should be described as: []     0    Normal activity, no symptoms [x]     1    Restricted in physical strenuous activity but ambulatory, able to do out light work []     2    Ambulatory and capable of self care, unable to do work activities, up and about >50 % of waking hours                              []     3     Only limited self care, in bed greater than 50% of waking hours []     4    Completely disabled, no self care, confined to bed or chair []     5    Moribund  Past Medical History:  Diagnosis Date   (HFpEF) heart failure with preserved ejection fraction (HCC)    a. 01/2019 Echo: EF 55-60%, mild conc LVH. DD.  Torn MV chordae.   Anemia    Atypical chest pain    a. 08/2018 MV: EF 59%, no ischemia; b. 02/2019 Cath: nonobs dzs.   Blood transfusion without reported diagnosis    Breast cancer (HCC) 10/12/2020   Cataract    ESRD (end stage renal disease) on dialysis [facility])    a. HD T, T, S   Essential hypertension, benign    GERD (gastroesophageal reflux disease)    Headache    Hemorrhoids    Mixed hyperlipidemia    Morbid obesity (HCC)    Non-obstructive CAD (coronary artery disease)    a. 02/2019 CathL LM nl, LAD 26m, LCX nl, RCA 25p, EF 55-65%.   PONV (postoperative nausea and vomiting)    S/P colonoscopy Jan 2011   Dr. Elnoria Howard: sessile polyp (benign lymphoid), large hemorrhoids, repeat 5-10 years   Temporal arteritis (HCC)    Type 2  diabetes mellitus (HCC)    Wears glasses     Past Surgical History:  Procedure Laterality Date   ABDOMINAL HYSTERECTOMY     APPENDECTOMY     ARTERY BIOPSY N/A 05/09/2018   Procedure: RIGHT TEMPORAL ARTERY BIOPSY;  Surgeon: Jimmye Norman, MD;  Location: [facility] OR;  Service: General;  Laterality: N/A;   BREAST BIOPSY Right 06/15/2020   Procedure: RIGHT BREAST BIOPSY;  Surgeon: Franky Macho, MD;  Location: [facility];  Service: General;  Laterality: Right;   CATARACT EXTRACTION W/PHACO Left 02/09/2017   Procedure: CATARACT EXTRACTION PHACO AND INTRAOCULAR LENS PLACEMENT LEFT EYE;  Surgeon: Gemma Payor, MD;  Location: [facility];  Service: Ophthalmology;  Laterality: Left;  CDE: 4.89   CATARACT EXTRACTION W/PHACO Right 06/04/2017   Procedure: CATARACT EXTRACTION PHACO AND INTRAOCULAR LENS PLACEMENT (IOC);  Surgeon: Gemma Payor, MD;  Location: [facility];  Service:  Ophthalmology;  Laterality: Right;  CDE: 4.12   CHOLECYSTECTOMY  09/29/2011   Procedure: LAPAROSCOPIC CHOLECYSTECTOMY;  Surgeon: Dalia Heading, MD;  Location: [facility];  Service: General;  Laterality: N/A;   COLONOSCOPY  08/2009   Dr. Elnoria Howard: sessile polyp (benign lymphoid), large hemorrhoids, repeat 5-10 years   COLONOSCOPY N/A 06/12/2016   prominent hemorrhoids   COLONOSCOPY WITH PROPOFOL N/A 06/16/2021   Procedure: COLONOSCOPY WITH PROPOFOL;  Surgeon: Corbin Ade, MD;  Location: [facility];  Service: Endoscopy;  Laterality: N/A;  9:30am (dialysis pt)   ESOPHAGOGASTRODUODENOSCOPY  09/05/2011   XBJ:[id number] hiatal hernia; remainder of exam normal. No explanation for patient's abdominal pain with today's examination   ESOPHAGOGASTRODUODENOSCOPY N/A 12/17/2013   Dr. Jena Gauss: gastric erythema, erosion, mild chronic inflammation on path    ESOPHAGOGASTRODUODENOSCOPY (EGD) WITH PROPOFOL N/A 06/16/2021   Procedure: ESOPHAGOGASTRODUODENOSCOPY (EGD) WITH PROPOFOL;  Surgeon: Corbin Ade, MD;  Location: [facility];  Service: Endoscopy;  Laterality: N/A;   EXCISION OF BREAST BIOPSY Right 10/12/2020   Procedure: EXCISION OF RIGHT BREAST BIOPSY;  Surgeon: Franky Macho, MD;  Location: [facility];  Service: General;  Laterality: Right;   LAPAROSCOPIC APPENDECTOMY  09/29/2011   Procedure: APPENDECTOMY LAPAROSCOPIC;  Surgeon: Dalia Heading, MD;  Location: [facility];  Service: General;;  incidental appendectomy   LEFT HEART CATH AND CORONARY ANGIOGRAPHY N/A 02/28/2019   Procedure: LEFT HEART CATH AND CORONARY ANGIOGRAPHY;  Surgeon: Corky Crafts, MD;  Location: [facility] INVASIVE CV LAB;  Service: Cardiovascular;  Laterality: N/A;   MASS EXCISION Right 01/18/2023   Procedure: EXCISION MASS, RIGHT CHEST S/P MASTECTOMY;  Surgeon: Franky Macho, MD;  Location: [facility];  Service: General;  Laterality: Right;   MASTECTOMY MODIFIED RADICAL Right 02/18/2020   Procedure: MASTECTOMY MODIFIED RADICAL;  Surgeon: Franky Macho, MD;  Location: [facility];  Service: General;  Laterality: Right;   MASTECTOMY, PARTIAL Right 07/13/2020   Procedure: RIGHT PARTIAL MASTECTOMY;  Surgeon: Franky Macho, MD;  Location: [facility];  Service: General;  Laterality: Right;   PARTIAL MASTECTOMY WITH NEEDLE LOCALIZATION AND AXILLARY SENTINEL LYMPH NODE BX Right 09/18/2018   Procedure: RIGHT PARTIAL MASTECTOMY AFTER NEEDLE LOCALIZATION, SENTINEL LYMPH NODE BIOPSY RIGHT AXILLA;  Surgeon: Franky Macho, MD;  Location: [facility];  Service: General;  Laterality: Right;   POLYPECTOMY  06/16/2021   Procedure: POLYPECTOMY;  Surgeon: Corbin Ade, MD;  [facility]: [facility];  Service: Endoscopy;;   SIMPLE MASTECTOMY WITH AXILLARY SENTINEL NODE BIOPSY Left 06/15/2020   Procedure: LEFT SIMPLE MASTECTOMY;  Surgeon: Franky Macho, MD;  Location: [facility];  Service: General;  Laterality: Left;  Family History  Problem Relation Age of Onset   Hypertension Mother    Coronary artery disease Mother    Diabetes Mother    Hypertension Sister    Coronary artery disease Sister    Hypertension Brother    Heart attack Father    Hypertension Son    Heart attack Maternal Aunt    Hypertension Maternal Aunt    Diabetes Maternal Aunt    Heart attack Maternal Uncle    Hypertension Maternal Uncle    Diabetes Maternal Uncle    Heart attack Paternal Aunt    Hypertension Paternal Aunt    Diabetes Paternal Aunt    Heart attack Paternal Uncle    Hypertension Paternal Uncle    Diabetes Paternal Uncle    Heart attack Maternal Grandmother    Heart attack Maternal Grandfather    Heart attack Paternal Grandmother    Heart attack Paternal Grandfather    Colon cancer Neg Hx     Social History Social History   Tobacco Use   Smoking status: Never    Passive exposure: Never   Smokeless tobacco: Never  Vaping Use   Vaping status: Never Used  Substance Use Topics   Alcohol use: No   Drug use: No    Current Outpatient Medications  Medication Sig  Dispense Refill   acetaminophen (TYLENOL) 500 MG tablet Take 500-1,000 mg by mouth every 6 (six) hours as needed (pain.).     amLODipine (NORVASC) 10 MG tablet TAKE 1 TABLET BY MOUTH AT BEDTIME 90 tablet 3   anastrozole (ARIMIDEX) 1 MG tablet TAKE 1 TABLET BY MOUTH DAILY 90 tablet 3   aspirin EC 81 MG tablet Take 81 mg by mouth in the morning.     atorvastatin (LIPITOR) 20 MG tablet TAKE 1 TABLET(20 MG) BY MOUTH DAILY 90 tablet 0   B Complex-C-Zn-Folic Acid (DIALYVITE 800-ZINC 15) 0.8 MG TABS Take 1 tablet by mouth in the morning.     cinacalcet (SENSIPAR) 30 MG tablet Take 2 tablets (60 mg total) by mouth daily. (Patient taking differently: Take 30 mg by mouth daily. With a meal) 90 tablet 0   Darbepoetin Alfa (ARANESP, ALBUMIN FREE, IJ) every Monday, Wednesday, and Friday with hemodialysis.     fluticasone (FLONASE) 50 MCG/ACT nasal spray Place 2 sprays into both nostrils daily. 16 g 6   gabapentin (NEURONTIN) 300 MG capsule TAKE 1 CAPSULE(300 MG) BY MOUTH AT BEDTIME 90 capsule 1   iron sucrose (VENOFER) 20 MG/ML injection Inject 50 mg into the vein once a week.     isosorbide mononitrate (IMDUR) 30 MG 24 hr tablet Take 1.5 tablets (45 mg total) by mouth daily. 135 tablet 3   lanthanum (FOSRENOL) 1000 MG chewable tablet Chew 2,000-3,000 mg by mouth See admin instructions. Take 3 tablets (3000 mg) by mouth with meals and take 2 tablets (2000 mg) with snacks     lidocaine-prilocaine (EMLA) cream Apply 1 application  topically every Monday, Wednesday, and Friday with hemodialysis.     LOPERAMIDE HCL PO Take by mouth.     meclizine (ANTIVERT) 25 MG tablet TAKE 1 TABLET(25 MG) BY MOUTH THREE TIMES DAILY AS NEEDED FOR DIZZINESS 30 tablet 1   nebivolol (BYSTOLIC) 10 MG tablet Take 1 tablet (10 mg total) by mouth in the morning and at bedtime. 180 tablet 3   nitroGLYCERIN (NITROSTAT) 0.4 MG SL tablet Place 1 tablet (0.4 mg total) under the tongue every 5 (five) minutes x 3 doses as needed for chest pain  (  if no relief after 3rd dose, proceed to ED or call 911). 25 tablet 3   ondansetron (ZOFRAN) 4 MG tablet Take 1 tablet (4 mg total) by mouth every 8 (eight) hours as needed for nausea or vomiting. 10 tablet 0   oxyCODONE (ROXICODONE) 5 MG immediate release tablet Take 1 tablet (5 mg total) by mouth every 4 (four) hours as needed for severe pain. 25 tablet 0   pantoprazole (PROTONIX) 40 MG tablet TAKE 1 TABLET(40 MG) BY MOUTH DAILY 90 tablet 0   sucralfate (CARAFATE) 1 GM/10ML suspension Take 10 mLs (1 g total) by mouth with breakfast, with lunch, and with evening meal. (Patient taking differently: Take 1 g by mouth 3 (three) times daily as needed (stomach pain.).) 420 mL 2   VITAMIN D PO Take 0.25 mcg by mouth daily.     No current facility-administered medications for this visit.    No Known Allergies  Review of Systems  Constitutional:  Negative for activity change, fatigue and unexpected weight change.  HENT:  Negative for trouble swallowing and voice change.   Eyes:  Negative for visual disturbance.  Respiratory:  Negative for shortness of breath and wheezing.   Cardiovascular:  Positive for chest pain and leg swelling.  Gastrointestinal:  Positive for abdominal pain (reflux).  Genitourinary:        ESRD on HD  Musculoskeletal:        Right Chest wall and Right arm pain  Neurological:  Negative for seizures and weakness.  Hematological:  Negative for adenopathy. Does not bruise/bleed easily.    BP (!) 148/77 (BP Location: Right Arm, Patient Position: Sitting, Cuff Size: Large)   Pulse 84   Resp 20   Ht 5\' 1"  (1.549 m)   Wt 184 lb (83.5 kg)   SpO2 94% Comment: RA  BMI 34.77 kg/m  Physical Exam Vitals reviewed.  Constitutional:      General: She is not in acute distress.    Appearance: Normal appearance.  HENT:     Head: Normocephalic and atraumatic.  Eyes:     Extraocular Movements: Extraocular movements intact.  Neck:     Vascular: No carotid bruit.  Cardiovascular:      Rate and Rhythm: Normal rate and regular rhythm.     Heart sounds: Normal heart sounds. No murmur heard.    No friction rub. No gallop.     Comments: Fistula left arm with thrill Pulmonary:     Effort: Pulmonary effort is normal. No respiratory distress.     Breath sounds: Normal breath sounds. No wheezing or rales.  Abdominal:     General: There is no distension.     Palpations: Abdomen is soft.  Musculoskeletal:     Comments: Right chest wall deformity from previous mastectomy and reexcision  Lymphadenopathy:     Cervical: No cervical adenopathy.  Skin:    General: Skin is warm and dry.  Neurological:     General: No focal deficit present.     Mental Status: She is alert and oriented to person, place, and time.     Cranial Nerves: No cranial nerve deficit.     Motor: No weakness.    Diagnostic Tests: NUCLEAR MEDICINE PET SKULL BASE TO THIGH   TECHNIQUE: 9.1 mCi F-18 FDG was injected intravenously. Full-ring PET imaging was performed from the skull base to thigh after the radiotracer. CT data was obtained and used for attenuation correction and anatomic localization.   Fasting blood glucose: 136 mg/dl  COMPARISON:  08/31/2021 abdominopelvic CT. 09/06/2021 bone scan. Most recent PET 09/14/2020   FINDINGS: Mediastinal blood pool activity: SUV max 2.6   Liver activity: SUV max NA   NECK: No areas of abnormal hypermetabolism.   Incidental CT findings: No cervical adenopathy. Left carotid atherosclerosis.   CHEST: No pulmonary parenchymal or thoracic nodal hypermetabolism.   Incidental CT findings: Right axillary node dissection. No axillary or subpectoral adenopathy. Tiny hiatal hernia. Mild cardiomegaly. Aortic and coronary artery calcification. Right apical radiation change. Vague right lower lobe 5 mm nodule and 97/3 is similar to the prior, favoring a benign etiology.   Tiny left apical pulmonary nodules x2 including on 62 and 65 of series 3 are  unchanged and presumed benign.   ABDOMEN/PELVIS: No abdominopelvic parenchymal or nodal hypermetabolism.   Incidental CT findings: Cholecystectomy. Normal left adrenal gland. An 8 mm right suprarenal nodule is favored to arise from the adrenal gland and is not tracer avid on 136/3. Not readily apparent on the prior.   Moderate bilateral renal cortical thinning. A right renal mildly hypoattenuating 7 mm lesion is similar to on the prior, favoring a hemorrhagic/proteinaceous cyst. Bilateral renal collecting system calculi. Abdominal aortic atherosclerosis. Apparent colonic wall thickening involving the ascending and transverse segments, favored to be due to underdistention.   Bilateral internal iliac stents.  Hysterectomy.   SKELETON: Status post bilateral mastectomy. About the medial right pectoralis musculature is hypermetabolism corresponding to subtle soft tissue fullness. Example at a S.U.V. max of 13.497/3.   No abnormal marrow activity.   Incidental CT findings: No acute osseous abnormality.   IMPRESSION: 1. Recurrent/metastatic disease within the right medial pectoralis musculature in this patient who is status post bilateral mastectomy. 2. A right suprarenal 8 mm nodule is likely arising from the adrenal gland, not readily apparent on the prior, but not tracer avid. Most likely an incidental benign lesion. Recommend attention on follow-up. 3. Incidental findings, including: Coronary artery atherosclerosis. Aortic Atherosclerosis (ICD10-I70.0). Moderate bilateral renal atrophy. Nephrolithiasis.     Electronically Signed   By: Jeronimo Greaves M.D.   On: 12/04/2022 10:54    Impression: Megan [name] is a 62 year old woman with a past medical history significant for obesity, hypertension, end-stage renal disease on hemodialysis, nonobstructive coronary disease, HFpEF, type 2 diabetes not on medications, hyperlipidemia, and recurrent breast cancer.  Recurrent breast  cancer-stage Ib on initial diagnosis treated with lumpectomy, sentinel node biopsy and radiation and chemotherapy.  Recurrence treated with mastectomy in 2021.  Now with another chest wall recurrence.  Underwent excision but had positive deep margin with tumor involving the intercostal musculature.  No evidence of disease elsewhere.  I had a long discussion with Mrs. Bona and her family.  Her husband and her sister accompanied her.  I do think it is feasible to resect the local recurrence at the chest wall as that is her only site of disease.  However I think it would require a wide excision including taking ribs above and below and would require chest wall reconstruction including a myocutaneous flap.  The operation would need to be done in conjunction with plastic surgery for flap closure.  I discussed the proposed operation with her and her family.  I informed her of the general nature of the surgery including the need for general anesthesia, the magnitude of the operation, the possibility of need for reconstruction of the chest wall with the prosthetic material and coverage with a myocutaneous flap.  I informed him of the indications, risks, benefits,  and alternatives.  They understand the risks include, but are not limited to death, MI, DVT, PE, bleeding, possible need for transfusion, infection, cardiac arrhythmias, as well as possibility of other unforeseeable complications.  They understand that the degree of pain is unpredictable but could be significant with this type of operation.  She is uncertain if she wants to undergo an operation of that magnitude.  She did agree to meet with plastic surgery before making a final decision.  She also wants to talk to Dr. Langston Masker again as she might be willing to accept risk of complications from radiation rather than undergo a major operative procedure.  Plan: Will refer to Dr. Ulice Bold for consideration for myocutaneous flap closure Return in 3 to 4  weeks to discuss possible resection again.  I spent over 45 minutes in review of records, images, and in consultation with Mrs. Dimitrov today.  Loreli Slot, MD [facility] [phone]

## 2023-03-28 DIAGNOSIS — D631 Anemia in chronic kidney disease: Secondary | ICD-10-CM | POA: Diagnosis not present

## 2023-03-28 DIAGNOSIS — N2581 Secondary hyperparathyroidism of renal origin: Secondary | ICD-10-CM | POA: Diagnosis not present

## 2023-03-28 DIAGNOSIS — E119 Type 2 diabetes mellitus without complications: Secondary | ICD-10-CM | POA: Diagnosis not present

## 2023-03-28 DIAGNOSIS — E1129 Type 2 diabetes mellitus with other diabetic kidney complication: Secondary | ICD-10-CM | POA: Diagnosis not present

## 2023-03-28 DIAGNOSIS — Z992 Dependence on renal dialysis: Secondary | ICD-10-CM | POA: Diagnosis not present

## 2023-03-28 DIAGNOSIS — N186 End stage renal disease: Secondary | ICD-10-CM | POA: Diagnosis not present

## 2023-03-28 DIAGNOSIS — C50911 Malignant neoplasm of unspecified site of right female breast: Secondary | ICD-10-CM | POA: Diagnosis not present

## 2023-03-28 DIAGNOSIS — C7989 Secondary malignant neoplasm of other specified sites: Secondary | ICD-10-CM | POA: Diagnosis not present

## 2023-03-30 ENCOUNTER — Emergency Department (HOSPITAL_COMMUNITY): Payer: Medicare Other

## 2023-03-30 ENCOUNTER — Other Ambulatory Visit: Payer: Self-pay

## 2023-03-30 ENCOUNTER — Encounter (HOSPITAL_COMMUNITY): Payer: Self-pay | Admitting: Emergency Medicine

## 2023-03-30 ENCOUNTER — Emergency Department (HOSPITAL_COMMUNITY)
Admission: EM | Admit: 2023-03-30 | Discharge: 2023-03-30 | Disposition: A | Discharge status: Home / Self Care | Payer: Medicare Other | Attending: Emergency Medicine | Admitting: Emergency Medicine

## 2023-03-30 DIAGNOSIS — Z79899 Other long term (current) drug therapy: Secondary | ICD-10-CM | POA: Insufficient documentation

## 2023-03-30 DIAGNOSIS — R1110 Vomiting, unspecified: Secondary | ICD-10-CM | POA: Insufficient documentation

## 2023-03-30 DIAGNOSIS — I120 Hypertensive chronic kidney disease with stage 5 chronic kidney disease or end stage renal disease: Secondary | ICD-10-CM | POA: Diagnosis not present

## 2023-03-30 DIAGNOSIS — R1013 Epigastric pain: Secondary | ICD-10-CM

## 2023-03-30 DIAGNOSIS — R079 Chest pain, unspecified: Secondary | ICD-10-CM

## 2023-03-30 DIAGNOSIS — N186 End stage renal disease: Secondary | ICD-10-CM | POA: Diagnosis not present

## 2023-03-30 DIAGNOSIS — E1122 Type 2 diabetes mellitus with diabetic chronic kidney disease: Secondary | ICD-10-CM | POA: Insufficient documentation

## 2023-03-30 DIAGNOSIS — R112 Nausea with vomiting, unspecified: Secondary | ICD-10-CM

## 2023-03-30 DIAGNOSIS — R197 Diarrhea, unspecified: Secondary | ICD-10-CM | POA: Diagnosis not present

## 2023-03-30 DIAGNOSIS — Z992 Dependence on renal dialysis: Secondary | ICD-10-CM | POA: Diagnosis not present

## 2023-03-30 DIAGNOSIS — Z7982 Long term (current) use of aspirin: Secondary | ICD-10-CM | POA: Diagnosis not present

## 2023-03-30 DIAGNOSIS — R109 Unspecified abdominal pain: Secondary | ICD-10-CM | POA: Insufficient documentation

## 2023-03-30 DIAGNOSIS — R0789 Other chest pain: Secondary | ICD-10-CM | POA: Diagnosis not present

## 2023-03-30 LAB — COMPREHENSIVE METABOLIC PANEL
ALT: 19 U/L (ref 0–44)
AST: 17 U/L (ref 15–41)
Albumin: 3.5 g/dL (ref 3.5–5.0)
Alkaline Phosphatase: 114 U/L (ref 38–126)
Anion gap: 14 (ref 5–15)
BUN: 52 mg/dL — ABNORMAL HIGH (ref 8–23)
CO2: 29 mmol/L (ref 22–32)
Calcium: 10 mg/dL (ref 8.9–10.3)
Chloride: 94 mmol/L — ABNORMAL LOW (ref 98–111)
Creatinine, Ser: 11.48 mg/dL — ABNORMAL HIGH (ref 0.44–1.00)
GFR, Estimated: 3 mL/min — ABNORMAL LOW (ref >60)
Glucose, Bld: 142 mg/dL — ABNORMAL HIGH (ref 70–99)
Potassium: 4.2 mmol/L (ref 3.5–5.1)
Sodium: 137 mmol/L (ref 135–145)
Total Bilirubin: 0.5 mg/dL (ref 0.3–1.2)
Total Protein: 7.5 g/dL (ref 6.5–8.1)

## 2023-03-30 LAB — URINALYSIS, ROUTINE W REFLEX MICROSCOPIC
Bilirubin Urine: NEGATIVE
Glucose, UA: 50 mg/dL — AB
Hgb urine dipstick: NEGATIVE
Ketones, ur: NEGATIVE mg/dL
Leukocytes,Ua: NEGATIVE
Nitrite: NEGATIVE
Protein, ur: 100 mg/dL — AB
Specific Gravity, Urine: 1.009 (ref 1.005–1.030)
pH: 9 — ABNORMAL HIGH (ref 5.0–8.0)

## 2023-03-30 LAB — CBC WITH DIFFERENTIAL/PLATELET
Abs Immature Granulocytes: 0.02 10*3/uL (ref 0.00–0.07)
Basophils Absolute: 0.1 10*3/uL (ref 0.0–0.1)
Basophils Relative: 1 %
Eosinophils Absolute: 0.5 10*3/uL (ref 0.0–0.5)
Eosinophils Relative: 5 %
HCT: 33.2 % — ABNORMAL LOW (ref 36.0–46.0)
Hemoglobin: 10.8 g/dL — ABNORMAL LOW (ref 12.0–15.0)
Immature Granulocytes: 0 %
Lymphocytes Relative: 13 %
Lymphs Abs: 1.1 10*3/uL (ref 0.7–4.0)
MCH: 33.6 pg (ref 26.0–34.0)
MCHC: 32.5 g/dL (ref 30.0–36.0)
MCV: 103.4 fL — ABNORMAL HIGH (ref 80.0–100.0)
Monocytes Absolute: 0.6 10*3/uL (ref 0.1–1.0)
Monocytes Relative: 8 %
Neutro Abs: 6.1 10*3/uL (ref 1.7–7.7)
Neutrophils Relative %: 73 %
Platelets: 215 10*3/uL (ref 150–400)
RBC: 3.21 MIL/uL — ABNORMAL LOW (ref 3.87–5.11)
RDW: 14.6 % (ref 11.5–15.5)
WBC: 8.3 10*3/uL (ref 4.0–10.5)
nRBC: 0 % (ref 0.0–0.2)

## 2023-03-30 LAB — C DIFFICILE QUICK SCREEN W PCR REFLEX
C Diff antigen: NEGATIVE
C Diff interpretation: NOT DETECTED
C Diff toxin: NEGATIVE

## 2023-03-30 LAB — TROPONIN I (HIGH SENSITIVITY)
Troponin I (High Sensitivity): 13 ng/L (ref <18)
Troponin I (High Sensitivity): 14 ng/L (ref <18)

## 2023-03-30 LAB — LIPASE, BLOOD: Lipase: 166 U/L — ABNORMAL HIGH (ref 11–51)

## 2023-03-30 MED ORDER — ONDANSETRON HCL 4 MG/2ML IJ SOLN
4.0000 mg | Freq: Once | INTRAMUSCULAR | Status: AC
  Administered 2023-03-30: 4 mg via INTRAVENOUS
  Filled 2023-03-30: qty 2

## 2023-03-30 MED ORDER — FENTANYL CITRATE PF 50 MCG/ML IJ SOSY
50.0000 ug | PREFILLED_SYRINGE | Freq: Once | INTRAMUSCULAR | Status: AC
  Administered 2023-03-30: 50 ug via INTRAVENOUS
  Filled 2023-03-30: qty 1

## 2023-03-30 MED ORDER — ONDANSETRON 4 MG PO TBDP: 4.0000 mg | ORAL_TABLET | Freq: Three times a day (TID) | ORAL | 0 refills | Status: DC | PRN

## 2023-03-30 NOTE — ED Triage Notes (Signed)
Pt to the ed from home c/o of abdominal pain along with diarrhea and emesis. Pt states symptoms started yesterday morning 0430 had diarrhea and emesis with abdominal pain. No more diarrhea or emesis until 0445 this morning when pt was getting ready for dialysis. Last dialysis was Wednesday. Pt denies any fevers. Pt ambulatory and still produces some urine

## 2023-03-30 NOTE — ED Notes (Signed)
Pink armband placed on pt's left wrist

## 2023-03-30 NOTE — Discharge Instructions (Signed)
You were seen for your abdominal pain, nausea, vomiting, diarrhea, and chest pain in the emergency department.   At home, please take the Zofran we have prescribed you for your nausea and vomiting.    Follow-up with your primary doctor in 2-3 days regarding your visit.  Cardiology will be calling you regarding an appointment within the next 72 hours.  You may contact them if you do not hear from them in that time using the information in this packet.  Return immediately to the emergency department if you experience any of the following: Persistent vomiting despite the medication, worsening pain, difficulty breathing, unexplained vomiting or sweating, or any other concerning symptoms.    Thank you for visiting our Emergency Department. It was a pleasure taking care of you today.

## 2023-03-30 NOTE — ED Provider Notes (Signed)
[place] EMERGENCY DEPARTMENT AT [facility] Provider Note   CSN: [id number] Arrival date & time: 03/30/23  4098     History  Chief Complaint  Patient presents with   Abdominal Pain    Megan [name] is a 62 y.o. female.  62 year old female with history of end-stage renal disease on dialysis, hypertension, diabetes but not currently being treated, vertigo who presents ER today secondary to abdominal discomfort, diarrhea and emesis.  Patient also has a history of hemorrhoids.  Patient states that yesterday morning when she woke up around 430 she had nonbloody nonbilious emesis and nonbloody diarrhea.  She had crampy abdominal pain around that time.  Yesterday she slept in bed and just generally felt unwell.  No fevers.  Had an episode yesterday where she had some blood in the toilet after having a bowel movement and also some blood on the toilet paper.  This morning when she woke up to go to dialysis she had similar symptoms came here for evaluation.  No fevers.  Has upper abdominal pain bilaterally.  Less so in the lower abdomen abdomen.  No shortness of breath or chest pain.  No sick contacts.  No suspicious food intake.   Abdominal Pain      Home Medications Prior to Admission medications   Medication Sig Start Date End Date Taking? Authorizing Provider  acetaminophen (TYLENOL) 500 MG tablet Take 500-1,000 mg by mouth every 6 (six) hours as needed (pain.).    [provider]  amLODipine (NORVASC) 10 MG tablet TAKE 1 TABLET BY MOUTH AT BEDTIME 08/15/22   Donita Brooks, MD  anastrozole (ARIMIDEX) 1 MG tablet TAKE 1 TABLET BY MOUTH DAILY 01/16/23   [name], MD  aspirin EC 81 MG tablet Take 81 mg by mouth in the morning.    [provider]  atorvastatin (LIPITOR) 20 MG tablet TAKE 1 TABLET(20 MG) BY MOUTH DAILY 03/15/23   Donita Brooks, MD  B Complex-C-Zn-Folic Acid (DIALYVITE 800-ZINC 15) 0.8 MG TABS Take 1 tablet by mouth in the  morning. 06/19/22   [provider]  cinacalcet (SENSIPAR) 30 MG tablet Take 2 tablets (60 mg total) by mouth daily. Patient taking differently: Take 30 mg by mouth daily. With a meal 06/13/18   Donita Brooks, MD  Darbepoetin Alfa [place], ALBUMIN FREE, IJ) every Monday, Wednesday, and Friday with hemodialysis. 06/23/22 06/22/23  [provider]  fluticasone (FLONASE) 50 MCG/ACT nasal spray Place 2 sprays into both nostrils daily. 10/20/22   Donita Brooks, MD  gabapentin (NEURONTIN) 300 MG capsule TAKE 1 CAPSULE(300 MG) BY MOUTH AT BEDTIME 10/31/22   Donita Brooks, MD  iron sucrose (VENOFER) 20 MG/ML injection Inject 50 mg into the vein once a week. 02/19/23 02/18/24  [provider]  isosorbide mononitrate (IMDUR) 30 MG 24 hr tablet Take 1.5 tablets (45 mg total) by mouth daily. 09/13/22   Antoine Poche, MD  lanthanum (FOSRENOL) 1000 MG chewable tablet Chew 2,000-3,000 mg by mouth See admin instructions. Take 3 tablets (3000 mg) by mouth with meals and take 2 tablets (2000 mg) with snacks 10/16/19   [provider]  lidocaine-prilocaine (EMLA) cream Apply 1 application  topically every Monday, Wednesday, and Friday with hemodialysis. 09/24/18   [provider]  LOPERAMIDE HCL PO Take by mouth. 10/02/22 10/01/23  [provider]  meclizine (ANTIVERT) 25 MG tablet TAKE 1 TABLET(25 MG) BY MOUTH THREE TIMES DAILY AS NEEDED FOR DIZZINESS 03/21/22   Pickard,  Priscille Heidelberg, MD  nebivolol (BYSTOLIC) 10 MG tablet Take 1 tablet (10 mg total) by mouth in the morning and at bedtime. 04/13/22   Donita Brooks, MD  nitroGLYCERIN (NITROSTAT) 0.4 MG SL tablet Place 1 tablet (0.4 mg total) under the tongue every 5 (five) minutes x 3 doses as needed for chest pain (if no relief after 3rd dose, proceed to ED or call 911). 09/13/22   Antoine Poche, MD  ondansetron (ZOFRAN) 4 MG tablet Take 1 tablet (4 mg total) by mouth every 8 (eight) hours as needed for nausea or  vomiting. 11/09/22   Park Meo, FNP  oxyCODONE (ROXICODONE) 5 MG immediate release tablet Take 1 tablet (5 mg total) by mouth every 4 (four) hours as needed for severe pain. 01/18/23   Franky Macho, MD  pantoprazole (PROTONIX) 40 MG tablet TAKE 1 TABLET(40 MG) BY MOUTH DAILY 01/12/23   Donita Brooks, MD  sucralfate (CARAFATE) 1 GM/10ML suspension Take 10 mLs (1 g total) by mouth with breakfast, with lunch, and with evening meal. Patient taking differently: Take 1 g by mouth 3 (three) times daily as needed (stomach pain.). 10/20/22   Donita Brooks, MD  VITAMIN D PO Take 0.25 mcg by mouth daily. 02/02/23 02/01/24  [provider]      Allergies    Patient has no known allergies.    Review of Systems   Review of Systems  Gastrointestinal:  Positive for abdominal pain.    Physical Exam Updated Vital Signs BP 137/75   Pulse 77   Temp 98.1 F (36.7 C) (Oral)   Resp 15   Ht 5\' 2"  (1.575 m)   Wt 81.6 kg   SpO2 94%   BMI 32.92 kg/m  Physical Exam Vitals and nursing note reviewed.  Constitutional:      Appearance: She is well-developed.  HENT:     Head: Normocephalic and atraumatic.  Cardiovascular:     Rate and Rhythm: Normal rate and regular rhythm.  Pulmonary:     Effort: No respiratory distress.     Breath sounds: No stridor.  Abdominal:     General: There is no distension.  Musculoskeletal:     Cervical back: Normal range of motion.  Neurological:     Mental Status: She is alert.     ED Results / Procedures / Treatments   Labs (all labs ordered are listed, but only abnormal results are displayed) Labs Reviewed  CBC WITH DIFFERENTIAL/PLATELET  COMPREHENSIVE METABOLIC PANEL    EKG None  Radiology No results found.  Procedures Procedures    Medications Ordered in ED Medications  ondansetron (ZOFRAN) injection 4 mg (has no administration in time range)    ED Course/ Medical Decision Making/ A&P Clinical Course as of 04/08/23 2311  Fri  Mar 30, 2023  1610 Assumed care from Dr Clayborne Dana. Presents with abdominal pain and n/v/d. Has rescheduled iHD for tomorrow. Lipase 166. Getting ct abdomen and pelvis. Got fentanyl and zofran. One episode of blood in her stool but thinks it is from her hemorroids.  Also reported 5 to 10 minutes of chest pain yesterday.  Was substernal and dull.  Says that it was in the setting of being told that she would have to have a surgery for her breast cancer that is risky and thinks that it was due to anxiety.  Did not have any diaphoresis, new dizziness, shortness of breath, or other symptoms associated with it. [RP]  0901 CT ABDOMEN PELVIS WO  CONTRAST No acute findings [RP]  0909 No urinary symptoms.  Patient had serial troponins that were WNL.  Her C. difficile returned as negative.  Did have a mildly elevated lipase but no findings of pancreatitis on her CT.  Patient was counseled to take Zofran and eat a bland diet for the next few days and to return to the emergency department if she should experience any concerning symptoms.  Feel that she is suitable for outpatient follow-up since she is tolerating p.o. at this time.  Will have her follow-up with her primary doctor in several days.  Did also give her cardiology follow-up because of her chest discomfort. [RP]    Clinical Course User Index [RP] Rondel Baton, MD                                 Medical Decision Making Amount and/or Complexity of Data Reviewed Labs: ordered. Radiology: ordered. Decision-making details documented in ED Course.  Risk Prescription drug management.  Evaluate for emergent causes of abdominal pain.  Will need a CT as well secondary to her medical history to evaluate for any serious pathology.  Will treat symptomatically now for the nausea and hold on any treatment for the diarrhea as she is not having that many episodes.  I suspect the blood was from her hemorrhoid but will check hemoglobin to verify no worsening anemia.  Patient has already rescheduled her dialysis so unless K is high, no indication for emergent dialysis as she's not sob/cp or hypoxic.  Lipase slightly up.  Care transferred pending CT for abdominal pain and to ensure no pancreatitis.    Final Clinical Impression(s) / ED Diagnoses Final diagnoses:  None    Rx / DC Orders ED Discharge Orders     None         Anuradha Chabot, Barbara Cower, MD 04/08/23 2312

## 2023-03-30 NOTE — ED Notes (Signed)
ED Provider at bedside. 

## 2023-03-30 NOTE — ED Provider Notes (Signed)
  Physical Exam  BP (!) 140/85   Pulse 76   Temp 98.1 F (36.7 C) (Oral)   Resp 18   Ht 5\' 2"  (1.575 m)   Wt 81.6 kg   SpO2 95%   BMI 32.92 kg/m   Physical Exam  Procedures  Procedures  ED Course / MDM   Clinical Course as of 03/30/23 1723  Fri Mar 30, 2023  7253 Assumed care from Dr Clayborne Dana. Presents with abdominal pain and n/v/d. Has rescheduled iHD for tomorrow. Lipase 166. Getting ct abdomen and pelvis. Got fentanyl and zofran. One episode of blood in her stool but thinks it is from her hemorroids.  Also reported 5 to 10 minutes of chest pain yesterday.  Was substernal and dull.  Says that it was in the setting of being told that she would have to have a surgery for her breast cancer that is risky and thinks that it was due to anxiety.  Did not have any diaphoresis, new dizziness, shortness of breath, or other symptoms associated with it. [RP]  0901 CT ABDOMEN PELVIS WO CONTRAST No acute findings [RP]  0909 No urinary symptoms.  Patient had serial troponins that were WNL.  Her C. difficile returned as negative.  Did have a mildly elevated lipase but no findings of pancreatitis on her CT.  Patient was counseled to take Zofran and eat a bland diet for the next few days and to return to the emergency department if she should experience any concerning symptoms.  Feel that she is suitable for outpatient follow-up since she is tolerating p.o. at this time.  Will have her follow-up with her primary doctor in several days.  Did also give her cardiology follow-up because of her chest discomfort. [RP]    Clinical Course User Index [RP] Rondel Baton, MD   Medical Decision Making Amount and/or Complexity of Data Reviewed Labs: ordered. Radiology: ordered. Decision-making details documented in ED Course.  Risk Prescription drug management.     Rondel Baton, MD 03/30/23 [phone]

## 2023-03-31 LAB — GASTROINTESTINAL PANEL BY PCR, STOOL (REPLACES STOOL CULTURE)

## 2023-04-02 DIAGNOSIS — N2581 Secondary hyperparathyroidism of renal origin: Secondary | ICD-10-CM | POA: Diagnosis not present

## 2023-04-02 DIAGNOSIS — E119 Type 2 diabetes mellitus without complications: Secondary | ICD-10-CM | POA: Diagnosis not present

## 2023-04-02 DIAGNOSIS — N186 End stage renal disease: Secondary | ICD-10-CM | POA: Diagnosis not present

## 2023-04-02 DIAGNOSIS — E1129 Type 2 diabetes mellitus with other diabetic kidney complication: Secondary | ICD-10-CM | POA: Diagnosis not present

## 2023-04-02 DIAGNOSIS — D631 Anemia in chronic kidney disease: Secondary | ICD-10-CM | POA: Diagnosis not present

## 2023-04-02 DIAGNOSIS — Z992 Dependence on renal dialysis: Secondary | ICD-10-CM | POA: Diagnosis not present

## 2023-04-03 ENCOUNTER — Ambulatory Visit (INDEPENDENT_AMBULATORY_CARE_PROVIDER_SITE_OTHER): Payer: Medicare Other | Admitting: Plastic Surgery

## 2023-04-03 ENCOUNTER — Encounter: Payer: Self-pay | Admitting: Plastic Surgery

## 2023-04-03 VITALS — BP 129/68 | HR 85 | Ht 62.0 in | Wt 181.0 lb

## 2023-04-03 DIAGNOSIS — Z170 Estrogen receptor positive status [ER+]: Secondary | ICD-10-CM

## 2023-04-03 DIAGNOSIS — C50911 Malignant neoplasm of unspecified site of right female breast: Secondary | ICD-10-CM

## 2023-04-03 DIAGNOSIS — C50211 Malignant neoplasm of upper-inner quadrant of right female breast: Secondary | ICD-10-CM

## 2023-04-03 DIAGNOSIS — Z9011 Acquired absence of right breast and nipple: Secondary | ICD-10-CM | POA: Diagnosis not present

## 2023-04-03 DIAGNOSIS — Z9012 Acquired absence of left breast and nipple: Secondary | ICD-10-CM

## 2023-04-03 DIAGNOSIS — D689 Coagulation defect, unspecified: Secondary | ICD-10-CM | POA: Diagnosis not present

## 2023-04-03 NOTE — Progress Notes (Unsigned)
Patient ID: Megan [name], female    DOB: 08-03-1961, 62 y.o.   MRN: [id number]   Chief Complaint  Patient presents with   Breast Cancer   Breast Problem    The patient is a 62 year old female here for consultation for her breasts.  She was diagnosed with right breast cancer that is ER positive and PR HER2 negative.  She underwent a right partial mastectomy in 2020 by Dr. Lovell Sheehan.  She had recurrent breast cancer on the right so underwent a modified mastectomy in July 2021.  She then had a prophylactic left mastectomy in November 2021.  In March 2022 she had further resection of the right breast with concerns for cancer.  She then had a repeat surgery in June 2024 for right breast cancer on the chest wall that was resected by Dr. Lovell Sheehan.  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  She also has a supra adrenal 8 mm nodule likely arising from the adrenal gland.  She underwent a brain MRI in May and CT-guided biopsy which showed continued disease in the right breast.  She had a CT of her abdomen and pelvis in August which showed no evidence of metastatic breast cancer but she does have aortic atherosclerosis.  The patient is explaining that Dr. Lovell Sheehan believes that she will need some ribs removed in order to get negative margins.  He thinks she may need a muscle flap for reconstruction.    Review of Systems  Constitutional: Negative.   HENT: Negative.    Eyes: Negative.   Respiratory: Negative.  Negative for chest tightness.   Cardiovascular: Negative.   Gastrointestinal: Negative.   Endocrine: Negative.   Genitourinary: Negative.   Musculoskeletal: Negative.     Past Medical History:  Diagnosis Date   (HFpEF) heart failure with preserved ejection fraction (HCC)    a. 01/2019 Echo: EF 55-60%, mild conc LVH. DD.  Torn MV chordae.   Anemia    Atypical chest pain    a. 08/2018 MV: EF 59%, no  ischemia; b. 02/2019 Cath: nonobs dzs.   Blood transfusion without reported diagnosis    Breast cancer (HCC) 10/12/2020   Cataract    ESRD (end stage renal disease) on dialysis [facility])    a. HD T, T, S   Essential hypertension, benign    GERD (gastroesophageal reflux disease)    Headache    Hemorrhoids    Mixed hyperlipidemia    Morbid obesity (HCC)    Non-obstructive CAD (coronary artery disease)    a. 02/2019 CathL LM nl, LAD 28m, LCX nl, RCA 25p, EF 55-65%.   PONV (postoperative nausea and vomiting)    S/P colonoscopy Jan 2011   Dr. Elnoria Howard: sessile polyp (benign lymphoid), large hemorrhoids, repeat 5-10 years   Temporal arteritis (HCC)    Type 2 diabetes mellitus (HCC)    Wears glasses     Past Surgical History:  Procedure Laterality Date   ABDOMINAL HYSTERECTOMY     APPENDECTOMY     ARTERY BIOPSY N/A 05/09/2018   Procedure: RIGHT TEMPORAL ARTERY BIOPSY;  Surgeon: Jimmye Norman, MD;  Location: [facility] OR;  Service: General;  Laterality: N/A;   BREAST BIOPSY Right 06/15/2020   Procedure: RIGHT BREAST BIOPSY;  Surgeon: Franky Macho, MD;  Location: [facility] ORS;  Service: General;  Laterality: Right;   CATARACT EXTRACTION W/PHACO Left 02/09/2017   Procedure: CATARACT EXTRACTION PHACO AND INTRAOCULAR LENS PLACEMENT  LEFT EYE;  Surgeon: Gemma Payor, MD;  Location: [facility] ORS;  Service: Ophthalmology;  Laterality: Left;  CDE: 4.89   CATARACT EXTRACTION W/PHACO Right 06/04/2017   Procedure: CATARACT EXTRACTION PHACO AND INTRAOCULAR LENS PLACEMENT (IOC);  Surgeon: Gemma Payor, MD;  Location: [facility] OR[facility];  Service: Ophthalmology;  Laterality: Right;  CDE: 4.12   CHOLECYSTECTOMY  09/29/2011   Procedure: LAPAROSCOPIC CHOLECYSTECTOMY;  Surgeon: Dalia Heading, MD;  Location: [facility] ORS;  Service: General;  Laterality: N/A;   COLONOSCOPY  08/2009   Dr. Elnoria Howard: sessile polyp (benign lymphoid), large hemorrhoids, repeat 5-10 years   COLONOSCOPY N/A 06/12/2016   prominent hemorrhoids   COLONOSCOPY WITH PROPOFOL N/A  06/16/2021   Procedure: COLONOSCOPY WITH PROPOFOL;  Surgeon: Corbin Ade, MD;  Location: [facility];  Service: Endoscopy;  Laterality: N/A;  9:30am (dialysis pt)   ESOPHAGOGASTRODUODENOSCOPY  09/05/2011   [id number] hiatal hernia; remainder of exam normal. No explanation for patient's abdominal pain with today's examination   ESOPHAGOGASTRODUODENOSCOPY N/A 12/17/2013   Dr. Jena Gauss: gastric erythema, erosion, mild chronic inflammation on path    ESOPHAGOGASTRODUODENOSCOPY (EGD) WITH PROPOFOL N/A 06/16/2021   Procedure: ESOPHAGOGASTRODUODENOSCOPY (EGD) WITH PROPOFOL;  Surgeon: Corbin Ade, MD;  Location: [facility];  Service: Endoscopy;  Laterality: N/A;   EXCISION OF BREAST BIOPSY Right 10/12/2020   Procedure: EXCISION OF RIGHT BREAST BIOPSY;  Surgeon: Franky Macho, MD;  Location: [facility];  Service: General;  Laterality: Right;   LAPAROSCOPIC APPENDECTOMY  09/29/2011   Procedure: APPENDECTOMY LAPAROSCOPIC;  Surgeon: Dalia Heading, MD;  Location: [facility];  Service: General;;  incidental appendectomy   LEFT HEART CATH AND CORONARY ANGIOGRAPHY N/A 02/28/2019   Procedure: LEFT HEART CATH AND CORONARY ANGIOGRAPHY;  Surgeon: Corky Crafts, MD;  [facility] CV LAB;  Service: Cardiovascular;  Laterality: N/A;   MASS EXCISION Right 01/18/2023   Procedure: EXCISION MASS, RIGHT CHEST S/P MASTECTOMY;  Surgeon: Franky Macho, MD;  Location: [facility];  Service: General;  Laterality: Right;   MASTECTOMY MODIFIED RADICAL Right 02/18/2020   Procedure: MASTECTOMY MODIFIED RADICAL;  Surgeon: Franky Macho, MD;  Location: [facility];  Service: General;  Laterality: Right;   MASTECTOMY, PARTIAL Right 07/13/2020   Procedure: RIGHT PARTIAL MASTECTOMY;  Surgeon: Franky Macho, MD;  Location: [facility];  Service: General;  Laterality: Right;   PARTIAL MASTECTOMY WITH NEEDLE LOCALIZATION AND AXILLARY SENTINEL LYMPH NODE BX Right 09/18/2018   Procedure: RIGHT PARTIAL MASTECTOMY AFTER NEEDLE LOCALIZATION,  SENTINEL LYMPH NODE BIOPSY RIGHT AXILLA;  Surgeon: Franky Macho, MD;  Location: [facility];  Service: General;  Laterality: Right;   POLYPECTOMY  06/16/2021   Procedure: POLYPECTOMY;  Surgeon: Corbin Ade, MD;  Location: [facility];  Service: Endoscopy;;   SIMPLE MASTECTOMY WITH AXILLARY SENTINEL NODE BIOPSY Left 06/15/2020   Procedure: LEFT SIMPLE MASTECTOMY;  Surgeon: Franky Macho, MD;  Location: [facility];  Service: General;  Laterality: Left;      Current Outpatient Medications:    acetaminophen (TYLENOL) 500 MG tablet, Take 500-1,000 mg by mouth every 6 (six) hours as needed (pain.)., Disp: , Rfl:    amLODipine (NORVASC) 10 MG tablet, TAKE 1 TABLET BY MOUTH AT BEDTIME, Disp: 90 tablet, Rfl: 3   anastrozole (ARIMIDEX) 1 MG tablet, TAKE 1 TABLET BY MOUTH DAILY, Disp: 90 tablet, Rfl: 3   aspirin EC 81 MG tablet, Take 81 mg by mouth in the morning., Disp: , Rfl:    atorvastatin (LIPITOR) 20 MG tablet, TAKE 1 TABLET(20 MG) BY MOUTH DAILY,  Disp: 90 tablet, Rfl: 0   B Complex-C-Zn-Folic Acid (DIALYVITE 800-ZINC 15) 0.8 MG TABS, Take 1 tablet by mouth in the morning., Disp: , Rfl:    cinacalcet (SENSIPAR) 30 MG tablet, Take 2 tablets (60 mg total) by mouth daily. (Patient taking differently: Take 30 mg by mouth daily. With a meal), Disp: 90 tablet, Rfl: 0   Darbepoetin Alfa (ARANESP, ALBUMIN FREE, IJ), every Monday, Wednesday, and Friday with hemodialysis., Disp: , Rfl:    fluticasone (FLONASE) 50 MCG/ACT nasal spray, Place 2 sprays into both nostrils daily., Disp: 16 g, Rfl: 6   gabapentin (NEURONTIN) 300 MG capsule, TAKE 1 CAPSULE(300 MG) BY MOUTH AT BEDTIME, Disp: 90 capsule, Rfl: 1   iron sucrose (VENOFER) 20 MG/ML injection, Inject 50 mg into the vein once a week., Disp: , Rfl:    isosorbide mononitrate (IMDUR) 30 MG 24 hr tablet, Take 1.5 tablets (45 mg total) by mouth daily., Disp: 135 tablet, Rfl: 3   lanthanum (FOSRENOL) 1000 MG chewable tablet, Chew 2,000-3,000 mg by mouth See admin  instructions. Take 3 tablets (3000 mg) by mouth with meals and take 2 tablets (2000 mg) with snacks, Disp: , Rfl:    lidocaine-prilocaine (EMLA) cream, Apply 1 application  topically every Monday, Wednesday, and Friday with hemodialysis., Disp: , Rfl:    LOPERAMIDE HCL PO, Take by mouth., Disp: , Rfl:    meclizine (ANTIVERT) 25 MG tablet, TAKE 1 TABLET(25 MG) BY MOUTH THREE TIMES DAILY AS NEEDED FOR DIZZINESS, Disp: 30 tablet, Rfl: 1   nebivolol (BYSTOLIC) 10 MG tablet, Take 1 tablet (10 mg total) by mouth in the morning and at bedtime., Disp: 180 tablet, Rfl: 3   nitroGLYCERIN (NITROSTAT) 0.4 MG SL tablet, Place 1 tablet (0.4 mg total) under the tongue every 5 (five) minutes x 3 doses as needed for chest pain (if no relief after 3rd dose, proceed to ED or call 911)., Disp: 25 tablet, Rfl: 3   ondansetron (ZOFRAN) 4 MG tablet, Take 1 tablet (4 mg total) by mouth every 8 (eight) hours as needed for nausea or vomiting., Disp: 10 tablet, Rfl: 0   ondansetron (ZOFRAN-ODT) 4 MG disintegrating tablet, Take 1 tablet (4 mg total) by mouth every 8 (eight) hours as needed for nausea or vomiting., Disp: 12 tablet, Rfl: 0   oxyCODONE (ROXICODONE) 5 MG immediate release tablet, Take 1 tablet (5 mg total) by mouth every 4 (four) hours as needed for severe pain., Disp: 25 tablet, Rfl: 0   pantoprazole (PROTONIX) 40 MG tablet, TAKE 1 TABLET(40 MG) BY MOUTH DAILY, Disp: 90 tablet, Rfl: 0   sucralfate (CARAFATE) 1 GM/10ML suspension, Take 10 mLs (1 g total) by mouth with breakfast, with lunch, and with evening meal. (Patient taking differently: Take 1 g by mouth 3 (three) times daily as needed (stomach pain.).), Disp: 420 mL, Rfl: 2   VITAMIN D PO, Take 0.25 mcg by mouth daily., Disp: , Rfl:    Objective:   Vitals:   04/03/23 0939  BP: 129/68  Pulse: 85  SpO2: 92%    Physical Exam Vitals and nursing note reviewed.  Constitutional:      Appearance: Normal appearance.  HENT:     Head: Atraumatic.   Cardiovascular:     Rate and Rhythm: Normal rate.     Pulses: Normal pulses.  Pulmonary:     Effort: Pulmonary effort is normal.  Abdominal:     Palpations: Abdomen is soft.  Musculoskeletal:        General:  No swelling, tenderness or deformity.  Skin:    General: Skin is warm.     Capillary Refill: Capillary refill takes less than 2 seconds.     Findings: No bruising.  Neurological:     Mental Status: She is alert and oriented to person, place, and time.  Psychiatric:        Mood and Affect: Mood normal.        Behavior: Behavior normal.        Thought Content: Thought content normal.        Judgment: Judgment normal.     Assessment & Plan:  Coagulation defect, unspecified (HCC)  Malignant neoplasm of upper-inner quadrant of right breast in female, estrogen receptor positive (HCC)  Malignant neoplasm of right breast in female, estrogen receptor positive, unspecified site of breast (HCC)  S/P mastectomy, right  S/P left mastectomy  Recurrent breast cancer, right (HCC)  I spoke with Dr. Lovell Sheehan.  Rib resection not planned.  Reconstruction not requested by surgeon.  Dr. Lovell Sheehan will reach out to the patient and will then let me know if I am needed.  Pictures were obtained of the patient and placed in the chart with the patient's or guardian's permission.   Alena Bills Antoinette Haskett, DO

## 2023-04-04 DIAGNOSIS — E119 Type 2 diabetes mellitus without complications: Secondary | ICD-10-CM | POA: Diagnosis not present

## 2023-04-04 DIAGNOSIS — N186 End stage renal disease: Secondary | ICD-10-CM | POA: Diagnosis not present

## 2023-04-04 DIAGNOSIS — E039 Hypothyroidism, unspecified: Secondary | ICD-10-CM | POA: Diagnosis not present

## 2023-04-04 DIAGNOSIS — Z992 Dependence on renal dialysis: Secondary | ICD-10-CM | POA: Diagnosis not present

## 2023-04-04 DIAGNOSIS — E1129 Type 2 diabetes mellitus with other diabetic kidney complication: Secondary | ICD-10-CM | POA: Diagnosis not present

## 2023-04-04 DIAGNOSIS — N2581 Secondary hyperparathyroidism of renal origin: Secondary | ICD-10-CM | POA: Diagnosis not present

## 2023-04-04 DIAGNOSIS — D631 Anemia in chronic kidney disease: Secondary | ICD-10-CM | POA: Diagnosis not present

## 2023-04-05 ENCOUNTER — Telehealth: Payer: Self-pay | Admitting: Family Medicine

## 2023-04-05 NOTE — Telephone Encounter (Signed)
Fax included forms and requested office visit notes, totaling 27 pages. Please see previous message in thread.

## 2023-04-05 NOTE — Telephone Encounter (Signed)
Successfully faxed PRIOR AUTHORIZATION PRESCRIPTION REQUEST FORM FOR LUMBAR ORTHOSIS to [facility] via fax number [phone], Attn: Andree Coss with a time stamp of 04-05-2023 10:25.

## 2023-04-06 DIAGNOSIS — E1129 Type 2 diabetes mellitus with other diabetic kidney complication: Secondary | ICD-10-CM | POA: Diagnosis not present

## 2023-04-06 DIAGNOSIS — E119 Type 2 diabetes mellitus without complications: Secondary | ICD-10-CM | POA: Diagnosis not present

## 2023-04-06 DIAGNOSIS — Z992 Dependence on renal dialysis: Secondary | ICD-10-CM | POA: Diagnosis not present

## 2023-04-06 DIAGNOSIS — D631 Anemia in chronic kidney disease: Secondary | ICD-10-CM | POA: Diagnosis not present

## 2023-04-06 DIAGNOSIS — N186 End stage renal disease: Secondary | ICD-10-CM | POA: Diagnosis not present

## 2023-04-06 DIAGNOSIS — N2581 Secondary hyperparathyroidism of renal origin: Secondary | ICD-10-CM | POA: Diagnosis not present

## 2023-04-09 ENCOUNTER — Inpatient Hospital Stay: Payer: Medicare Other | Admitting: Family Medicine

## 2023-04-09 DIAGNOSIS — N2581 Secondary hyperparathyroidism of renal origin: Secondary | ICD-10-CM | POA: Diagnosis not present

## 2023-04-09 DIAGNOSIS — E119 Type 2 diabetes mellitus without complications: Secondary | ICD-10-CM | POA: Diagnosis not present

## 2023-04-09 DIAGNOSIS — D631 Anemia in chronic kidney disease: Secondary | ICD-10-CM | POA: Diagnosis not present

## 2023-04-09 DIAGNOSIS — Z992 Dependence on renal dialysis: Secondary | ICD-10-CM | POA: Diagnosis not present

## 2023-04-09 DIAGNOSIS — E1129 Type 2 diabetes mellitus with other diabetic kidney complication: Secondary | ICD-10-CM | POA: Diagnosis not present

## 2023-04-09 DIAGNOSIS — N186 End stage renal disease: Secondary | ICD-10-CM | POA: Diagnosis not present

## 2023-04-10 ENCOUNTER — Telehealth: Payer: Medicare Other | Admitting: Plastic Surgery

## 2023-04-10 DIAGNOSIS — C50911 Malignant neoplasm of unspecified site of right female breast: Secondary | ICD-10-CM

## 2023-04-10 NOTE — Progress Notes (Addendum)
   Subjective:    Patient ID: Megan [name], female    DOB: Jan 13, 1961, 62 y.o.   MRN: [id number]  The patient is a 62 year old female joining me by phone for further discussion about her breasts.  She was diagnosed with right breast cancer.  She is estrogen positive and progesterone negative and HER2 negative.  She had a partial mastectomy by Dr. Lovell Sheehan in 2020 and then had recurrent breast cancer on the right so underwent a modified mastectomy in July 2021.  She had a prophylactic left mastectomy in November 2021.  She required further resection of the right breast in March 2022 and then in June 2024 with deep margins being on the chest wall.  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  She had a brain MRI in May and a CT-guided biopsy which showed disease in the right breast.  The CT scan did not show any evidence of metastasis in the abdomen or the pelvis.    Review of Systems  Constitutional: Negative.   HENT: Negative.    Eyes: Negative.   Respiratory: Negative.    Cardiovascular: Negative.   Gastrointestinal: Negative.   Endocrine: Negative.        Objective:   Physical Exam Vitals and nursing note reviewed.  Constitutional:      Appearance: Normal appearance.  HENT:     Head: Atraumatic.  Cardiovascular:     Rate and Rhythm: Normal rate.     Pulses: Normal pulses.  Pulmonary:     Effort: Pulmonary effort is normal.  Skin:    General: Skin is warm.  Neurological:     Mental Status: She is alert and oriented to person, place, and time.  Psychiatric:        Mood and Affect: Mood normal.        Behavior: Behavior normal.        Thought Content: Thought content normal.        Judgment: Judgment normal.         Assessment & Plan:     ICD-10-CM   1. Recurrent breast cancer, right (HCC)  C50.911       I connected with  Farris Has on 04/13/23 by phone and verified that I am  speaking with the correct person using two identifiers. The patient was in [place] and I was at the office.  We spent 5 min in discussion.   I discussed the limitations of evaluation and management by telemedicine. The patient expressed understanding and agreed to proceed.  I spoke with Dr. Lovell Sheehan.  I also spoke with Dr. Dorris Fetch.  Dr. Dorris Fetch stated that a muscle flap would be needed for him to be able to do the surgery.  I expressed my concern that I do not think that the patient would heal from the donor site or the recipient site if a muscle flap is needed.  I believe that surgical management of this condition would lead the patient to an open nonhealing wound that could become extremely problematic.  I have sent a message to her oncologist and we will call the patient as soon as I hear back from the oncologist.  I do not plan on any surgical management from a plastic surgery standpoint.

## 2023-04-11 ENCOUNTER — Inpatient Hospital Stay: Payer: Medicare Other | Attending: Hematology | Admitting: Hematology

## 2023-04-11 VITALS — BP 127/67 | HR 92 | Temp 98.6°F | Resp 18 | Ht 62.0 in | Wt 182.5 lb

## 2023-04-11 DIAGNOSIS — Z79811 Long term (current) use of aromatase inhibitors: Secondary | ICD-10-CM | POA: Insufficient documentation

## 2023-04-11 DIAGNOSIS — Z170 Estrogen receptor positive status [ER+]: Secondary | ICD-10-CM

## 2023-04-11 DIAGNOSIS — E119 Type 2 diabetes mellitus without complications: Secondary | ICD-10-CM | POA: Diagnosis not present

## 2023-04-11 DIAGNOSIS — E559 Vitamin D deficiency, unspecified: Secondary | ICD-10-CM | POA: Diagnosis not present

## 2023-04-11 DIAGNOSIS — C50911 Malignant neoplasm of unspecified site of right female breast: Secondary | ICD-10-CM | POA: Insufficient documentation

## 2023-04-11 DIAGNOSIS — N186 End stage renal disease: Secondary | ICD-10-CM | POA: Diagnosis not present

## 2023-04-11 DIAGNOSIS — E1129 Type 2 diabetes mellitus with other diabetic kidney complication: Secondary | ICD-10-CM | POA: Diagnosis not present

## 2023-04-11 DIAGNOSIS — C7989 Secondary malignant neoplasm of other specified sites: Secondary | ICD-10-CM | POA: Insufficient documentation

## 2023-04-11 DIAGNOSIS — M8580 Other specified disorders of bone density and structure, unspecified site: Secondary | ICD-10-CM | POA: Diagnosis not present

## 2023-04-11 DIAGNOSIS — Z992 Dependence on renal dialysis: Secondary | ICD-10-CM | POA: Diagnosis not present

## 2023-04-11 DIAGNOSIS — N2581 Secondary hyperparathyroidism of renal origin: Secondary | ICD-10-CM | POA: Diagnosis not present

## 2023-04-11 DIAGNOSIS — R0789 Other chest pain: Secondary | ICD-10-CM | POA: Diagnosis not present

## 2023-04-11 DIAGNOSIS — C50211 Malignant neoplasm of upper-inner quadrant of right female breast: Secondary | ICD-10-CM

## 2023-04-11 DIAGNOSIS — D631 Anemia in chronic kidney disease: Secondary | ICD-10-CM | POA: Diagnosis not present

## 2023-04-11 NOTE — Progress Notes (Signed)
[facility] [address], [place] [address]    Clinic Day:  04/11/2023  Referring physician: Donita Brooks, MD  Patient Care Team: Donita Brooks, MD as PCP - General (Family Medicine) [name] [name], MD as PCP - Cardiology (Cardiology) [name], MD (Gastroenterology) [name], MD as Consulting Physician (Cardiology) [name], MD as Medical Oncologist (Medical Oncology) [name], MD as Consulting Physician (Nephrology)   ASSESSMENT & PLAN:   Assessment: 1.  Stage Ib (T1CN0) right breast cancer, ER positive, PR and HER-2 negative: -Right lumpectomy and sentinel lymph node biopsy on 09/18/2018 shows 1.5 cm IDC, grade 3, associated with high-grade DCIS, negative margins, 0/3 lymph nodes positive, ER 50%, PR negative and HER-2 negative, Ki-67 60%. -Oncotype DX recurrence score 47.  Distant recurrence at 9 years with tamoxifen alone is 36%.  Absolute chemotherapy benefit is more than 15%. -Adjuvant chemotherapy with 4 cycles of AC from 10/30/2018 through 01/01/2019. -She was started on anastrozole in June 2020. -Mammogram on 09/02/2019 showed calcifications in the posterior aspect of upper outer quadrant of the right breast. -Right partial mastectomy on 02/18/2020 shows high-grade DCIS, 1.5 cm, no invasive carcinoma, resection margins negative.  0/11 lymph nodes.  ER 30% positive, PR negative. -Left simple mastectomy on 06/15/2020 was benign.  Right breast biopsy shows microscopic focus of invasive ductal carcinoma in the background of extensive high-grade DCIS. -Right breast lumpectomy on 07/13/2020 shows multifocal invasive ductal carcinoma, grade 3, largest measuring 1.2 cm.  Extensive high-grade DCIS with necrosis.  Margins are free.  PT1CNX.  There are multiple foci of invasive carcinoma arising from extensive DCIS.  Several small foci of invasive tumor arising from DCIS indicating new primaries.  ER/PR/HER-2 is negative.   Ki-67 is 20%. -PET scan on 09/14/2020 shows areas of nodularity with discrete nodule in the fat of the inferior chest wall within the subcutaneous tissues. -Ultrasound of the right chest wall shows masslike abnormality in the upper inner aspect of the right anterior chest at 1 o'clock position measuring 4.1 cm in greatest dimension.  1.7 cm hypoechoic mass in the central anterior right chest concerning for malignancy.  Possible borderline enlarged residual right axillary lymph node. -Right breast biopsy on 10/12/2020 shows 1.3 cm invasive ductal carcinoma, focally 0.1 cm from superior margin.  DCIS is less than 0.1 cm from superior margin.  PT1CPNX. - Right mastectomy followed by radiation therapy completed in May 2022. - PET scan (11/30/2022): Recurrent/metastatic disease within the right medial pectoralis musculature. - Biopsy (12/19/2022): Metastatic breast adenocarcinoma.  ER positive, 40%, weak staining.  PR negative.  Ki-67 30%.  HER2 IHC 0. - Right chest wall mass resection on 01/18/2023.  Pathology shows 1.7 cm IDC, grade 3.  DCIS with high-grade with necrosis.  Carcinoma involves inked inferior margin with focally invading into atrophic skeletal muscle. - As the mass was involving intercostal muscle, it could not be completely removed.  2.  Osteopenia: -Bone density on 01/20/2019 shows T score -1.9. - DEXA scan (03/24/2021) T score -2.4    Plan: 1.  Recurrent right breast cancer, ER 40%, weak staining, PR negative, HER2 negative: - She met with Dr. Dorris Fetch on 03/27/2023.  He thought that it is feasible to resect but it would require wide excision including taking ribs above and below and would require chest wall reconstruction including a myocutaneous flap. - She met with Dr. Ulice Bold. - She also talked to Dr.Wijetunga (radiation oncology) who has recommended preoperative radiotherapy, likely  given twice daily (45 Gray in 30 fractions) without a focal boost to potentially reduce the extent of  surgery and improve local control after completion of surgery.  This was also discussed with Dr. Dorris Fetch who did not necessarily think that the extent of his surgical approach will change after receiving preoperative radiation. - Patient and her husband are preferring preoperative radiation followed by surgery. - I will reach out to Dr. Langston Masker and make a plan.   2.  Osteopenia/vitamin D deficiency: - Continue Nephro-Vite.   3.  ESRD on HD: - Continue HD on Monday/Wednesday/Friday under the direction of Dr. Arrie Aran.   4.  Neuropathy: - Continue gabapentin at bedtime.   5.  Chest wall pain: - Continue oxycodone 5 mg daily as needed.    No orders of the defined types were placed in this encounter.     I,Katie Daubenspeck,acting as a Neurosurgeon for [name], MD.,have documented all relevant documentation on the behalf of [name], MD,as directed by  [name], MD while in the presence of [name], MD.   I, [name] MD, have reviewed the above documentation for accuracy and completeness, and I agree with the above.   [name], MD   8/28/20245:05 PM  CHIEF COMPLAINT:   Diagnosis: recurrent right breast cancer    Cancer Staging  Malignant neoplasm of upper-inner quadrant of right female breast [facility]) Staging form: Breast, AJCC 8th Edition - Clinical stage from 09/26/2018: Stage IB (cT1c, cN0, cM0, G3, ER+, PR-, HER2-) - Signed by [name], MD on 09/26/2018    Prior Therapy: 1. Right lumpectomy and sentinel lymph node biopsy on 09/18/2018  2. Adjuvant chemotherapy with 4 cycles of AC from 10/30/2018 through 01/01/2019  3. Right partial mastectomy on 02/18/2020  4. Right breast lumpectomy on 07/13/2020  5. Right mastectomy followed by radiation therapy completed in May 2022   Current Therapy:  anastrozole started June 2020    HISTORY OF PRESENT ILLNESS:   Oncology History  Malignant neoplasm of  upper-inner quadrant of right female breast (HCC)  09/18/2018 Initial Diagnosis   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)   09/26/2018 Cancer Staging   Staging form: Breast, AJCC 8th Edition - Clinical stage from 09/26/2018: Stage IB (cT1c, cN0, cM0, G3, ER+, PR-, HER2-) - Signed by [name], MD on 09/26/2018   10/30/2018 - 01/01/2019 Chemotherapy   Patient is on Treatment Plan : BREAST Adjuvant AC q21d        INTERVAL HISTORY:   Megan [name] is a 62 y.o. female presenting to clinic today for follow up of recurrent right breast cancer. She was last seen by me on 02/20/23.  Today, she states that she is doing well overall. Her appetite level is at 80%. Her energy level is at 7 5%.  PAST MEDICAL HISTORY:   Past Medical History: Past Medical History:  Diagnosis Date   (HFpEF) heart failure with preserved ejection fraction (HCC)    a. 01/2019 Echo: EF 55-60%, mild conc LVH. DD.  Torn MV chordae.   Anemia    Atypical chest pain    a. 08/2018 MV: EF 59%, no ischemia; b. 02/2019 Cath: nonobs dzs.   Blood transfusion without reported diagnosis    Breast cancer (HCC) 10/12/2020   Cataract    ESRD (end stage renal disease) on dialysis [facility])    a. HD T, T, S   Essential hypertension, benign    GERD (gastroesophageal reflux disease)    Headache  Hemorrhoids    Mixed hyperlipidemia    Morbid obesity (HCC)    Non-obstructive CAD (coronary artery disease)    a. 02/2019 CathL LM nl, LAD 36m, LCX nl, RCA 25p, EF 55-65%.   PONV (postoperative nausea and vomiting)    S/P colonoscopy Jan 2011   Dr. Elnoria Howard: sessile polyp (benign lymphoid), large hemorrhoids, repeat 5-10 years   Temporal arteritis (HCC)    Type 2 diabetes mellitus (HCC)    Wears glasses     Surgical History: Past Surgical History:  Procedure Laterality Date   ABDOMINAL HYSTERECTOMY     APPENDECTOMY     ARTERY BIOPSY N/A 05/09/2018   Procedure: RIGHT TEMPORAL ARTERY BIOPSY;  Surgeon: Jimmye Norman, MD;  Location: [facility] OR;  Service: General;  Laterality: N/A;   BREAST BIOPSY Right 06/15/2020   Procedure: RIGHT BREAST BIOPSY;  Surgeon: Franky Macho, MD;  Location: [facility] ORS;  Service: General;  Laterality: Right;   CATARACT EXTRACTION W/PHACO Left 02/09/2017   Procedure: CATARACT EXTRACTION PHACO AND INTRAOCULAR LENS PLACEMENT LEFT EYE;  Surgeon: Gemma Payor, MD;  Location: [facility] OR[facility];  Service: Ophthalmology;  Laterality: Left;  CDE: 4.89   CATARACT EXTRACTION W/PHACO Right 06/04/2017   Procedure: CATARACT EXTRACTION PHACO AND INTRAOCULAR LENS PLACEMENT (IOC);  Surgeon: Gemma Payor, MD;  Location: [facility] OR[facility];  Service: Ophthalmology;  Laterality: Right;  CDE: 4.12   CHOLECYSTECTOMY  09/29/2011   Procedure: LAPAROSCOPIC CHOLECYSTECTOMY;  Surgeon: Dalia Heading, MD;  Location: [facility] OR[facility];  Service: General;  Laterality: N/A;   COLONOSCOPY  08/2009   Dr. Elnoria Howard: sessile polyp (benign lymphoid), large hemorrhoids, repeat 5-10 years   COLONOSCOPY N/A 06/12/2016   prominent hemorrhoids   COLONOSCOPY WITH PROPOFOL N/A 06/16/2021   Procedure: COLONOSCOPY WITH PROPOFOL;  Surgeon: Corbin Ade, MD;  Location: [facility];  Service: Endoscopy;  Laterality: N/A;  9:30am (dialysis pt)   ESOPHAGOGASTRODUODENOSCOPY  09/05/2011   EXB:MWUXL hiatal hernia; remainder of exam normal. No explanation for patient's abdominal pain with today's examination   ESOPHAGOGASTRODUODENOSCOPY N/A 12/17/2013   Dr. Jena Gauss: gastric erythema, erosion, mild chronic inflammation on path    ESOPHAGOGASTRODUODENOSCOPY (EGD) WITH PROPOFOL N/A 06/16/2021   Procedure: ESOPHAGOGASTRODUODENOSCOPY (EGD) WITH PROPOFOL;  Surgeon: Corbin Ade, MD;  Location: [facility];  Service: Endoscopy;  Laterality: N/A;   EXCISION OF BREAST BIOPSY Right 10/12/2020   Procedure: EXCISION OF RIGHT BREAST BIOPSY;  Surgeon: Franky Macho, MD;  Location: [facility];  Service: General;  Laterality: Right;   LAPAROSCOPIC APPENDECTOMY  09/29/2011   Procedure:  APPENDECTOMY LAPAROSCOPIC;  Surgeon: Dalia Heading, MD;  Location: [facility];  Service: General;;  incidental appendectomy   LEFT HEART CATH AND CORONARY ANGIOGRAPHY N/A 02/28/2019   Procedure: LEFT HEART CATH AND CORONARY ANGIOGRAPHY;  Surgeon: Corky Crafts, MD;  Location: [facility] INVASIVE CV LAB;  Service: Cardiovascular;  Laterality: N/A;   MASS EXCISION Right 01/18/2023   Procedure: EXCISION MASS, RIGHT CHEST S/P MASTECTOMY;  Surgeon: Franky Macho, MD;  Location: [facility];  Service: General;  Laterality: Right;   MASTECTOMY MODIFIED RADICAL Right 02/18/2020   Procedure: MASTECTOMY MODIFIED RADICAL;  Surgeon: Franky Macho, MD;  Location: [facility];  Service: General;  Laterality: Right;   MASTECTOMY, PARTIAL Right 07/13/2020   Procedure: RIGHT PARTIAL MASTECTOMY;  Surgeon: Franky Macho, MD;  Location: [facility];  Service: General;  Laterality: Right;   PARTIAL MASTECTOMY WITH NEEDLE LOCALIZATION AND AXILLARY SENTINEL LYMPH NODE BX Right 09/18/2018   Procedure: RIGHT PARTIAL MASTECTOMY AFTER NEEDLE LOCALIZATION, SENTINEL  LYMPH NODE BIOPSY RIGHT AXILLA;  Surgeon: Franky Macho, MD;  Location: [facility];  Service: General;  Laterality: Right;   POLYPECTOMY  06/16/2021   Procedure: POLYPECTOMY;  Surgeon: Corbin Ade, MD;  [facility];  Service: Endoscopy;;   SIMPLE MASTECTOMY WITH AXILLARY SENTINEL NODE BIOPSY Left 06/15/2020   Procedure: LEFT SIMPLE MASTECTOMY;  Surgeon: Franky Macho, MD;  Location: [facility];  Service: General;  Laterality: Left;    Social History: Social History   Socioeconomic History   Marital status: Married    Spouse name: Not on file   Number of children: Not on file   Years of education: Not on file   Highest education level: Not on file  Occupational History   Occupation: Food Corporate treasurer: [employer]  Tobacco Use   Smoking status: Never    Passive exposure: Never   Smokeless tobacco: Never  Vaping Use   Vaping status: Never Used  Substance  and Sexual Activity   Alcohol use: No   Drug use: No   Sexual activity: Yes    Birth control/protection: Surgical  Other Topics Concern   Not on file  Social History Narrative   Works at [employer] in [place].    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   Also has to get items from high shelves-causes achy pain in shoulder area      Married.   Children are grown, out of house.   Social Determinants of Health   Financial Resource Strain: Low Risk  (10/10/2022)   Overall Financial Resource Strain (CARDIA)    Difficulty of Paying Living Expenses: Not hard at all  Food Insecurity: No Food Insecurity (10/10/2022)   Hunger Vital Sign    Worried About Running Out of Food in the Last Year: Never true    Ran Out of Food in the Last Year: Never true  Transportation Needs: No Transportation Needs (09/14/2022)   PRAPARE - Administrator, Civil Service (Medical): No    Lack of Transportation (Non-Medical): No  Physical Activity: Inactive (09/14/2022)   Exercise Vital Sign    Days of Exercise per Week: 0 days    Minutes of Exercise per Session: 0 min  Stress: No Stress Concern Present (09/14/2022)   [employer] of Occupational Health - Occupational Stress Questionnaire    Feeling of Stress : Not at all  Social Connections: Socially Integrated (09/14/2022)   Social Connection and Isolation Panel [NHANES]    Frequency of Communication with Friends and Family: More than three times a week    Frequency of Social Gatherings with Friends and Family: Three times a week    Attends Religious Services: More than 4 times per year    Active Member of Clubs or Organizations: Yes    Attends Banker Meetings: More than 4 times per year    Marital Status: Married  Catering manager Violence: Not At Risk (09/14/2022)   Humiliation, Afraid, Rape, and Kick questionnaire    Fear of Current or Ex-Partner: No    Emotionally Abused: No    Physically Abused: No    Sexually  Abused: No    Family History: Family History  Problem Relation Age of Onset   Hypertension Mother    Coronary artery disease Mother    Diabetes Mother    Hypertension Sister    Coronary artery disease Sister    Hypertension Brother    Heart attack  Father    Hypertension Son    Heart attack Maternal Aunt    Hypertension Maternal Aunt    Diabetes Maternal Aunt    Heart attack Maternal Uncle    Hypertension Maternal Uncle    Diabetes Maternal Uncle    Heart attack Paternal Aunt    Hypertension Paternal Aunt    Diabetes Paternal Aunt    Heart attack Paternal Uncle    Hypertension Paternal Uncle    Diabetes Paternal Uncle    Heart attack Maternal Grandmother    Heart attack Maternal Grandfather    Heart attack Paternal Grandmother    Heart attack Paternal Grandfather    Colon cancer Neg Hx     Current Medications:  Current Outpatient Medications:    acetaminophen (TYLENOL) 500 MG tablet, Take 500-1,000 mg by mouth every 6 (six) hours as needed (pain.)., Disp: , Rfl:    amLODipine (NORVASC) 10 MG tablet, TAKE 1 TABLET BY MOUTH AT BEDTIME, Disp: 90 tablet, Rfl: 3   anastrozole (ARIMIDEX) 1 MG tablet, TAKE 1 TABLET BY MOUTH DAILY, Disp: 90 tablet, Rfl: 3   aspirin EC 81 MG tablet, Take 81 mg by mouth in the morning., Disp: , Rfl:    atorvastatin (LIPITOR) 20 MG tablet, TAKE 1 TABLET(20 MG) BY MOUTH DAILY, Disp: 90 tablet, Rfl: 0   B Complex-C-Zn-Folic Acid (DIALYVITE 800-ZINC 15) 0.8 MG TABS, Take 1 tablet by mouth in the morning., Disp: , Rfl:    cinacalcet (SENSIPAR) 30 MG tablet, Take 2 tablets (60 mg total) by mouth daily. (Patient taking differently: Take 30 mg by mouth daily. With a meal), Disp: 90 tablet, Rfl: 0   Darbepoetin Alfa (ARANESP, ALBUMIN FREE, IJ), every Monday, Wednesday, and Friday with hemodialysis., Disp: , Rfl:    fluticasone (FLONASE) 50 MCG/ACT nasal spray, Place 2 sprays into both nostrils daily., Disp: 16 g, Rfl: 6   gabapentin (NEURONTIN) 300 MG  capsule, TAKE 1 CAPSULE(300 MG) BY MOUTH AT BEDTIME, Disp: 90 capsule, Rfl: 1   iron sucrose (VENOFER) 20 MG/ML injection, Inject 50 mg into the vein once a week., Disp: , Rfl:    isosorbide mononitrate (IMDUR) 30 MG 24 hr tablet, Take 1.5 tablets (45 mg total) by mouth daily., Disp: 135 tablet, Rfl: 3   lanthanum (FOSRENOL) 1000 MG chewable tablet, Chew 2,000-3,000 mg by mouth See admin instructions. Take 3 tablets (3000 mg) by mouth with meals and take 2 tablets (2000 mg) with snacks, Disp: , Rfl:    lidocaine-prilocaine (EMLA) cream, Apply 1 application  topically every Monday, Wednesday, and Friday with hemodialysis., Disp: , Rfl:    LOPERAMIDE HCL PO, Take by mouth., Disp: , Rfl:    meclizine (ANTIVERT) 25 MG tablet, TAKE 1 TABLET(25 MG) BY MOUTH THREE TIMES DAILY AS NEEDED FOR DIZZINESS, Disp: 30 tablet, Rfl: 1   nebivolol (BYSTOLIC) 10 MG tablet, Take 1 tablet (10 mg total) by mouth in the morning and at bedtime., Disp: 180 tablet, Rfl: 3   nitroGLYCERIN (NITROSTAT) 0.4 MG SL tablet, Place 1 tablet (0.4 mg total) under the tongue every 5 (five) minutes x 3 doses as needed for chest pain (if no relief after 3rd dose, proceed to ED or call 911)., Disp: 25 tablet, Rfl: 3   ondansetron (ZOFRAN) 4 MG tablet, Take 1 tablet (4 mg total) by mouth every 8 (eight) hours as needed for nausea or vomiting., Disp: 10 tablet, Rfl: 0   ondansetron (ZOFRAN-ODT) 4 MG disintegrating tablet, Take 1 tablet (4 mg total) by mouth  every 8 (eight) hours as needed for nausea or vomiting., Disp: 12 tablet, Rfl: 0   oxyCODONE (ROXICODONE) 5 MG immediate release tablet, Take 1 tablet (5 mg total) by mouth every 4 (four) hours as needed for severe pain., Disp: 25 tablet, Rfl: 0   pantoprazole (PROTONIX) 40 MG tablet, TAKE 1 TABLET(40 MG) BY MOUTH DAILY, Disp: 90 tablet, Rfl: 0   sucralfate (CARAFATE) 1 GM/10ML suspension, Take 10 mLs (1 g total) by mouth with breakfast, with lunch, and with evening meal. (Patient taking  differently: Take 1 g by mouth 3 (three) times daily as needed (stomach pain.).), Disp: 420 mL, Rfl: 2   VITAMIN D PO, Take 0.25 mcg by mouth daily., Disp: , Rfl:    Allergies: No Known Allergies  REVIEW OF SYSTEMS:   Review of Systems  Constitutional:  Negative for chills, fatigue and fever.  HENT:   Negative for lump/mass, mouth sores, nosebleeds, sore throat and trouble swallowing.   Eyes:  Negative for eye problems.  Respiratory:  Negative for cough and shortness of breath.   Cardiovascular:  Positive for chest pain. Negative for leg swelling and palpitations.  Gastrointestinal:  Positive for nausea. Negative for abdominal pain, constipation, diarrhea and vomiting.  Genitourinary:  Negative for bladder incontinence, difficulty urinating, dysuria, frequency, hematuria and nocturia.   Musculoskeletal:  Negative for arthralgias, back pain, flank pain, myalgias and neck pain.  Skin:  Negative for itching and rash.  Neurological:  Positive for dizziness and numbness. Negative for headaches.  Hematological:  Does not bruise/bleed easily.  Psychiatric/Behavioral:  Negative for depression, sleep disturbance and suicidal ideas. The patient is not nervous/anxious.   All other systems reviewed and are negative.    VITALS:   Blood pressure 127/67, pulse 92, temperature 98.6 F (37 C), temperature source Oral, resp. rate 18, height 5\' 2"  (1.575 m), weight 182 lb 8 oz (82.8 kg), SpO2 96%.  Wt Readings from Last 3 Encounters:  04/11/23 182 lb 8 oz (82.8 kg)  04/03/23 181 lb (82.1 kg)  03/30/23 180 lb (81.6 kg)    Body mass index is 33.38 kg/m.  Performance status (ECOG): 1 - Symptomatic but completely ambulatory  PHYSICAL EXAM:   Physical Exam Vitals and nursing note reviewed. Exam conducted with a chaperone present.  Constitutional:      Appearance: Normal appearance.  Cardiovascular:     Rate and Rhythm: Normal rate and regular rhythm.     Pulses: Normal pulses.     Heart  sounds: Normal heart sounds.  Pulmonary:     Effort: Pulmonary effort is normal.     Breath sounds: Normal breath sounds.  Abdominal:     Palpations: Abdomen is soft. There is no hepatomegaly, splenomegaly or mass.     Tenderness: There is no abdominal tenderness.  Musculoskeletal:     Right lower leg: No edema.     Left lower leg: No edema.  Lymphadenopathy:     Cervical: No cervical adenopathy.     Right cervical: No superficial, deep or posterior cervical adenopathy.    Left cervical: No superficial, deep or posterior cervical adenopathy.     Upper Body:     Right upper body: No supraclavicular or axillary adenopathy.     Left upper body: No supraclavicular or axillary adenopathy.  Neurological:     General: No focal deficit present.     Mental Status: She is alert and oriented to person, place, and time.  Psychiatric:        Mood  and Affect: Mood normal.        Behavior: Behavior normal.     LABS:      Latest Ref Rng & Units 03/30/2023    6:25 AM 01/18/2023    7:12 AM 12/26/2022    9:28 AM  CBC  WBC 4.0 - 10.5 K/uL 8.3   7.3   Hemoglobin 12.0 - 15.0 g/dL 16.0  10.9  32.3   Hematocrit 36.0 - 46.0 % 33.2  34.0  34.8   Platelets 150 - 400 K/uL 215   252       Latest Ref Rng & Units 03/30/2023    6:25 AM 01/18/2023    7:12 AM 12/26/2022    9:28 AM  CMP  Glucose 70 - 99 mg/dL 557  322  025   BUN 8 - 23 mg/dL 52  46  37   Creatinine 0.44 - 1.00 mg/dL 42.70  62.37  6.28   Sodium 135 - 145 mmol/L 137  138  138   Potassium 3.5 - 5.1 mmol/L 4.2  3.4  3.9   Chloride 98 - 111 mmol/L 94  97  95   CO2 22 - 32 mmol/L 29   29   Calcium 8.9 - 10.3 mg/dL 31.5   17.6   Total Protein 6.5 - 8.1 g/dL 7.5   7.7   Total Bilirubin 0.3 - 1.2 mg/dL 0.5   0.7   Alkaline Phos 38 - 126 U/L 114   93   AST 15 - 41 U/L 17   14   ALT 0 - 44 U/L 19   21      No results found for: "CEA1", "CEA" / No results found for: "CEA1", "CEA" No results found for: "PSA1" No results found for:  "HYW737" No results found for: "CAN125"  No results found for: "TOTALPROTELP", "ALBUMINELP", "A1GS", "A2GS", "BETS", "BETA2SER", "GAMS", "MSPIKE", "SPEI" Lab Results  Component Value Date   TIBC 195 (L) 02/15/2021   TIBC 190 (L) 12/10/2018   TIBC 218 (L) 06/08/2016   FERRITIN 1,742 (H) 02/15/2021   FERRITIN 1,308 (H) 12/10/2018   FERRITIN 126 06/08/2016   IRONPCTSAT 15 02/15/2021   IRONPCTSAT 43 (H) 12/10/2018   IRONPCTSAT 22 06/08/2016   No results found for: "LDH"   STUDIES:   CT ABDOMEN PELVIS WO CONTRAST  Result Date: 03/30/2023 CLINICAL DATA:  Abdominal pain, acute, nonlocalized. Vomiting and diarrhea since yesterday. EXAM: CT ABDOMEN AND PELVIS WITHOUT CONTRAST TECHNIQUE: Multidetector CT imaging of the abdomen and pelvis was performed following the standard protocol without IV contrast. RADIATION DOSE REDUCTION: This exam was performed according to the departmental dose-optimization program which includes automated exposure control, adjustment of the mA and/or kV according to patient size and/or use of iterative reconstruction technique. COMPARISON:  Abdominopelvic CT 08/31/2021. PET-CT 11/30/2022. Abdominal MRI 07/12/2021. FINDINGS: Lower chest: Mild subpleural scarring at both lung bases. A 4 mm part solid right lower lobe nodule on image 6/4 is unchanged from the previous studies and was not hypermetabolic on PET-CT. No new or enlarging pulmonary nodules are identified. No significant pleural or pericardial effusion. Atherosclerosis of the aorta and coronary arteries. Hepatobiliary: The liver appears unremarkable as imaged in the noncontrast state no evidence of significant biliary dilatation status post cholecystectomy. Pancreas: Unremarkable. No pancreatic ductal dilatation or surrounding inflammatory changes. Spleen: Normal in size without focal abnormality. Adrenals/Urinary Tract: The adrenal glands appear stable. 7 mm soft tissue nodule medial to the upper pole of the right  kidney on image 27/2  was described on the PET-CT and is unchanged, consistent with a benign finding. No specific follow-up imaging recommended. Marked renal cortical thinning and atrophy bilaterally with multiple nonobstructing renal calculi, simple and mildly complex renal cysts, grossly unchanged from previous CT and MRI. No specific follow-up imaging of these renal lesions is recommended. The bladder is nearly empty and suboptimally evaluated. Stomach/Bowel: Contrast has passed to the rectum. The stomach appears unremarkable for its degree of distention. No small bowel distension, wall thickening or surrounding inflammation. Previous appendectomy. Scattered distal colonic diverticulosis with mild wall thickening of the sigmoid colon, but no surrounding inflammation. Appearance is similar to the previous study. Vascular/Lymphatic: There are no enlarged abdominal or pelvic lymph nodes. Aortic and branch vessel atherosclerosis without evidence of aneurysm. Reproductive: Status post hysterectomy.  No adnexal mass. Other: Stable appearance of the anterior abdominal wall. No ascites, peritoneal nodularity or pneumoperitoneum. Musculoskeletal: No acute or significant osseous findings. No evidence of osseous metastatic disease. Mild sacroiliac degenerative changes bilaterally. Unless specific follow-up recommendations are mentioned in the findings or impression sections, no imaging follow-up of any mentioned incidental findings is recommended. IMPRESSION: 1. No acute findings or explanation for the patient's symptoms. 2. Distal colonic diverticulosis with mild wall thickening of the sigmoid colon, but no surrounding inflammation to suggest acute diverticulitis. 3. Stable appearance of the kidneys with bilateral renal cortical thinning and atrophy, consistent with chronic medical renal disease. Nonobstructing bilateral renal calculi. 4. No evidence of metastatic breast cancer. 5.  Aortic Atherosclerosis (ICD10-I70.0).  Electronically Signed   By: Carey Bullocks M.D.   On: 03/30/2023 08:56

## 2023-04-12 ENCOUNTER — Encounter: Payer: Medicare Other | Admitting: Pharmacist

## 2023-04-13 DIAGNOSIS — Z992 Dependence on renal dialysis: Secondary | ICD-10-CM | POA: Diagnosis not present

## 2023-04-13 DIAGNOSIS — N2581 Secondary hyperparathyroidism of renal origin: Secondary | ICD-10-CM | POA: Diagnosis not present

## 2023-04-13 DIAGNOSIS — D631 Anemia in chronic kidney disease: Secondary | ICD-10-CM | POA: Diagnosis not present

## 2023-04-13 DIAGNOSIS — N186 End stage renal disease: Secondary | ICD-10-CM | POA: Diagnosis not present

## 2023-04-13 DIAGNOSIS — E119 Type 2 diabetes mellitus without complications: Secondary | ICD-10-CM | POA: Diagnosis not present

## 2023-04-13 DIAGNOSIS — E1129 Type 2 diabetes mellitus with other diabetic kidney complication: Secondary | ICD-10-CM | POA: Diagnosis not present

## 2023-04-13 NOTE — Addendum Note (Signed)
Addended by: Peggye Form on: 04/13/2023 03:27 PM   Modules accepted: Level of Service

## 2023-04-15 DIAGNOSIS — N040 Nephrotic syndrome with minor glomerular abnormality: Secondary | ICD-10-CM | POA: Diagnosis not present

## 2023-04-15 DIAGNOSIS — Z992 Dependence on renal dialysis: Secondary | ICD-10-CM | POA: Diagnosis not present

## 2023-04-15 DIAGNOSIS — N186 End stage renal disease: Secondary | ICD-10-CM | POA: Diagnosis not present

## 2023-04-16 DIAGNOSIS — E119 Type 2 diabetes mellitus without complications: Secondary | ICD-10-CM | POA: Diagnosis not present

## 2023-04-16 DIAGNOSIS — N186 End stage renal disease: Secondary | ICD-10-CM | POA: Diagnosis not present

## 2023-04-16 DIAGNOSIS — E1129 Type 2 diabetes mellitus with other diabetic kidney complication: Secondary | ICD-10-CM | POA: Diagnosis not present

## 2023-04-16 DIAGNOSIS — D631 Anemia in chronic kidney disease: Secondary | ICD-10-CM | POA: Diagnosis not present

## 2023-04-16 DIAGNOSIS — N2581 Secondary hyperparathyroidism of renal origin: Secondary | ICD-10-CM | POA: Diagnosis not present

## 2023-04-16 DIAGNOSIS — Z992 Dependence on renal dialysis: Secondary | ICD-10-CM | POA: Diagnosis not present

## 2023-04-17 ENCOUNTER — Telehealth: Payer: Self-pay | Admitting: Medical

## 2023-04-17 ENCOUNTER — Encounter: Payer: Self-pay | Admitting: *Deleted

## 2023-04-17 ENCOUNTER — Encounter: Payer: Self-pay | Admitting: Medical

## 2023-04-17 ENCOUNTER — Encounter: Payer: Medicare Other | Admitting: Thoracic Surgery (Cardiothoracic Vascular Surgery)

## 2023-04-17 ENCOUNTER — Ambulatory Visit: Payer: Medicare Other | Attending: Physician Assistant | Admitting: Medical

## 2023-04-17 VITALS — BP 128/68 | HR 82 | Ht 62.0 in | Wt 181.0 lb

## 2023-04-17 DIAGNOSIS — E782 Mixed hyperlipidemia: Secondary | ICD-10-CM

## 2023-04-17 DIAGNOSIS — N186 End stage renal disease: Secondary | ICD-10-CM

## 2023-04-17 DIAGNOSIS — I2510 Atherosclerotic heart disease of native coronary artery without angina pectoris: Secondary | ICD-10-CM

## 2023-04-17 DIAGNOSIS — I10 Essential (primary) hypertension: Secondary | ICD-10-CM | POA: Diagnosis not present

## 2023-04-17 DIAGNOSIS — R928 Other abnormal and inconclusive findings on diagnostic imaging of breast: Secondary | ICD-10-CM | POA: Diagnosis not present

## 2023-04-17 DIAGNOSIS — Z992 Dependence on renal dialysis: Secondary | ICD-10-CM | POA: Insufficient documentation

## 2023-04-17 DIAGNOSIS — R079 Chest pain, unspecified: Secondary | ICD-10-CM | POA: Diagnosis not present

## 2023-04-17 MED ORDER — ISOSORBIDE MONONITRATE ER 30 MG PO TB24: 30.0000 mg | ORAL_TABLET | Freq: Two times a day (BID) | ORAL | 3 refills | Status: DC

## 2023-04-17 NOTE — Telephone Encounter (Signed)
New Message:      Patient was here today to see Cadance. She has a question about her medicine.     Pt c/o medication issue:  1. Name of Medication: Isosorbide  2. How are you currently taking this medication (dosage and times per day)?   3. Are you having a reaction (difficulty breathing--STAT)?   4. What is your medication issue? Question about the directions on how she is supposed to be taking

## 2023-04-17 NOTE — Patient Instructions (Signed)
Medication Instructions:  Your physician has recommended you make the following change in your medication:   Increase Imdur to 30 mg two times daily   *If you need a refill on your cardiac medications before your next appointment, please call your pharmacy*   Lab Work: NONE  If you have labs (blood work) drawn today and your tests are completely normal, you will receive your results only by: MyChart Message (if you have MyChart) OR A paper copy in the mail If you have any lab test that is abnormal or we need to change your treatment, we will call you to review the results.   Testing/Procedures: Your physician has requested that you have an echocardiogram. Echocardiography is a painless test that uses sound waves to create images of your heart. It provides your doctor with information about the size and shape of your heart and how well your heart's chambers and valves are working. This procedure takes approximately one hour. There are no restrictions for this procedure. Please do NOT wear cologne, perfume, aftershave, or lotions (deodorant is allowed). Please arrive 15 minutes prior to your appointment time.  Your physician has requested that you have a lexiscan myoview. For further information please visit [email or web address] Please follow instruction sheet, as given.    Follow-Up: At [facility], you and your health needs are our priority.  As part of our continuing mission to provide you with exceptional heart care, we have created designated Provider Care Teams.  These Care Teams include your primary Cardiologist (physician) and Advanced Practice Providers (APPs -  Physician Assistants and Nurse Practitioners) who all work together to provide you with the care you need, when you need it.  We recommend signing up for the patient portal called "MyChart".  Sign up information is provided on this After Visit Summary.  MyChart is used to connect with patients for Virtual Visits  (Telemedicine).  Patients are able to view lab/test results, encounter notes, upcoming appointments, etc.  Non-urgent messages can be sent to your provider as well.   To learn more about what you can do with MyChart, go to [email or web address].    Your next appointment:   1 month(s)  Provider:   You may see Dina Rich, MD or one of the following Advanced Practice Providers on your designated Care Team:   Randall An, PA-C  Jacolyn Reedy, [place]     Other Instructions Thank you for choosing [facility]!

## 2023-04-17 NOTE — Progress Notes (Signed)
Cardiology Office Note:    Date:  04/17/2023   ID:  Megan [name], DOB [birth year], MRN [id number]  PCP:  Donita Brooks, MD  [facility] Cardiologist:  Dina Rich, MD  [facility] Electrophysiologist:  None   Referring MD: Donita Brooks, MD   Chief Complaint: ER follow-up  History of Present Illness:    Megan [name] is a 62 y.o. female with a hx of chronic chest pain, nonobstructive CAD by cath in 2020, HTN, breast cancer, HLD ESRD on HD, HFpEF who presents for ER follow-up.  Patient is followed by Dr. [name].  She has a long history of chest pain.  In 2018 she had a normal nuclear stress test with normal perfusion.  In January 2020 she had another nuclear stress test that showed no ischemia.  Patient had a cardiac cath in June 2020 that showed proximal RCA disease of 25%, mid LAD 10% disease, otherwise normal vessels.  An ER visit in April 2023 for chest pain with negative troponin and overall normal workup.  Repeat ER visit in July 2023 with headache and chest pain, normal troponin and overall negative work-up.  Chest pain improved with Imdur.  Patient had a third ER visit in January 2020 with chest pain, negative tropes, normal EKG.  Pain improved with Tylenol.  Suspect possible vasospasm given nitro responsiveness.  Patient was last seen in 2024 and was overall stable from a cardiac perspective.  More recently, patient was seen in the ER March 30, 2023 reporting abdominal pain and chest pain.  Troponins were normal.  No pathology found for abdominal pain.  Patient was discharged home with cardiology follow-up.  Today, the patient reports she has persistent heavy chest pain. Has  some SOB that can occur when she sits. She has occasional heart racing. Pain is not worse with exertion. She gets tired quicker when she walks. No lower leg edema. Has some swelling in her hands.  Patient is compliant with dialysis.  Vitals today stable.  EKG reviewed from ER visit,  no ischemic changes noted.  Says chest pain is similar to prior chest pain episodes.   Past Medical History:  Diagnosis Date   (HFpEF) heart failure with preserved ejection fraction (HCC)    a. 01/2019 Echo: EF 55-60%, mild conc LVH. DD.  Torn MV chordae.   Anemia    Atypical chest pain    a. 08/2018 MV: EF 59%, no ischemia; b. 02/2019 Cath: nonobs dzs.   Blood transfusion without reported diagnosis    Breast cancer (HCC) 10/12/2020   Cataract    ESRD (end stage renal disease) on dialysis [facility])    a. HD T, T, S   Essential hypertension, benign    GERD (gastroesophageal reflux disease)    Headache    Hemorrhoids    Mixed hyperlipidemia    Morbid obesity (HCC)    Non-obstructive CAD (coronary artery disease)    a. 02/2019 CathL LM nl, LAD 72m, LCX nl, RCA 25p, EF 55-65%.   PONV (postoperative nausea and vomiting)    S/P colonoscopy Jan 2011   Dr. Elnoria Howard: sessile polyp (benign lymphoid), large hemorrhoids, repeat 5-10 years   Temporal arteritis (HCC)    Type 2 diabetes mellitus (HCC)    Wears glasses     Past Surgical History:  Procedure Laterality Date   ABDOMINAL HYSTERECTOMY     APPENDECTOMY     ARTERY BIOPSY N/A 05/09/2018   Procedure: RIGHT TEMPORAL ARTERY BIOPSY;  Surgeon: Jimmye Norman, MD;  Location: [facility] OR;  Service: General;  Laterality: N/A;   BREAST BIOPSY Right 06/15/2020   Procedure: RIGHT BREAST BIOPSY;  Surgeon: Franky Macho, MD;  Location: [facility] ORS;  Service: General;  Laterality: Right;   CATARACT EXTRACTION W/PHACO Left 02/09/2017   Procedure: CATARACT EXTRACTION PHACO AND INTRAOCULAR LENS PLACEMENT LEFT EYE;  Surgeon: Gemma Payor, MD;  Location: [facility] ORS;  Service: Ophthalmology;  Laterality: Left;  CDE: 4.89   CATARACT EXTRACTION W/PHACO Right 06/04/2017   Procedure: CATARACT EXTRACTION PHACO AND INTRAOCULAR LENS PLACEMENT (IOC);  Surgeon: Gemma Payor, MD;  Location: [facility] ORS;  Service: Ophthalmology;  Laterality: Right;  CDE: 4.12   CHOLECYSTECTOMY  09/29/2011    Procedure: LAPAROSCOPIC CHOLECYSTECTOMY;  Surgeon: Dalia Heading, MD;  Location: [facility] ORS;  Service: General;  Laterality: N/A;   COLONOSCOPY  08/2009   Dr. Elnoria Howard: sessile polyp (benign lymphoid), large hemorrhoids, repeat 5-10 years   COLONOSCOPY N/A 06/12/2016   prominent hemorrhoids   COLONOSCOPY WITH PROPOFOL N/A 06/16/2021   Procedure: COLONOSCOPY WITH PROPOFOL;  Surgeon: Corbin Ade, MD;  Location: [facility] ENDO SUITE;  Service: Endoscopy;  Laterality: N/A;  9:30am (dialysis pt)   ESOPHAGOGASTRODUODENOSCOPY  09/05/2011   [id number] hiatal hernia; remainder of exam normal. No explanation for patient's abdominal pain with today's examination   ESOPHAGOGASTRODUODENOSCOPY N/A 12/17/2013   Dr. Jena Gauss: gastric erythema, erosion, mild chronic inflammation on path    ESOPHAGOGASTRODUODENOSCOPY (EGD) WITH PROPOFOL N/A 06/16/2021   Procedure: ESOPHAGOGASTRODUODENOSCOPY (EGD) WITH PROPOFOL;  Surgeon: Corbin Ade, MD;  Location: [facility];  Service: Endoscopy;  Laterality: N/A;   EXCISION OF BREAST BIOPSY Right 10/12/2020   Procedure: EXCISION OF RIGHT BREAST BIOPSY;  Surgeon: Franky Macho, MD;  Location: [facility] ORS;  Service: General;  Laterality: Right;   LAPAROSCOPIC APPENDECTOMY  09/29/2011   Procedure: APPENDECTOMY LAPAROSCOPIC;  Surgeon: Dalia Heading, MD;  Location: [facility] ORS;  Service: General;;  incidental appendectomy   LEFT HEART CATH AND CORONARY ANGIOGRAPHY N/A 02/28/2019   Procedure: LEFT HEART CATH AND CORONARY ANGIOGRAPHY;  Surgeon: Corky Crafts, MD;  Location: [facility] INVASIVE CV LAB;  Service: Cardiovascular;  Laterality: N/A;   MASS EXCISION Right 01/18/2023   Procedure: EXCISION MASS, RIGHT CHEST S/P MASTECTOMY;  Surgeon: Franky Macho, MD;  Location: [facility];  Service: General;  Laterality: Right;   MASTECTOMY MODIFIED RADICAL Right 02/18/2020   Procedure: MASTECTOMY MODIFIED RADICAL;  Surgeon: Franky Macho, MD;  Location: [facility] OR[facility];  Service: General;  Laterality: Right;   MASTECTOMY,  PARTIAL Right 07/13/2020   Procedure: RIGHT PARTIAL MASTECTOMY;  Surgeon: Franky Macho, MD;  Location: [facility];  Service: General;  Laterality: Right;   PARTIAL MASTECTOMY WITH NEEDLE LOCALIZATION AND AXILLARY SENTINEL LYMPH NODE BX Right 09/18/2018   Procedure: RIGHT PARTIAL MASTECTOMY AFTER NEEDLE LOCALIZATION, SENTINEL LYMPH NODE BIOPSY RIGHT AXILLA;  Surgeon: Franky Macho, MD;  Location: [facility] ORS;  Service: General;  Laterality: Right;   POLYPECTOMY  06/16/2021   Procedure: POLYPECTOMY;  Surgeon: Corbin Ade, MD;  Location[facility];  Service: Endoscopy;;   SIMPLE MASTECTOMY WITH AXILLARY SENTINEL NODE BIOPSY Left 06/15/2020   Procedure: LEFT SIMPLE MASTECTOMY;  Surgeon: Franky Macho, MD;  Location: [facility]S;  Service: General;  Laterality: Left;    Current Medications: Current Meds  Medication Sig   acetaminophen (TYLENOL) 500 MG tablet Take 500-1,000 mg by mouth every 6 (six) hours as needed (pain.).   amLODipine (NORVASC) 10 MG tablet TAKE 1 TABLET BY MOUTH AT BEDTIME   anastrozole (ARIMIDEX) 1 MG  tablet TAKE 1 TABLET BY MOUTH DAILY   aspirin EC 81 MG tablet Take 81 mg by mouth in the morning.   atorvastatin (LIPITOR) 20 MG tablet TAKE 1 TABLET(20 MG) BY MOUTH DAILY   B Complex-C-Zn-Folic Acid (DIALYVITE 800-ZINC 15) 0.8 MG TABS Take 1 tablet by mouth in the morning.   cinacalcet (SENSIPAR) 30 MG tablet Take 2 tablets (60 mg total) by mouth daily. (Patient taking differently: Take 30 mg by mouth daily. With a meal)   Darbepoetin Alfa (ARANESP, ALBUMIN FREE, IJ) every Monday, Wednesday, and Friday with hemodialysis.   fluticasone (FLONASE) 50 MCG/ACT nasal spray Place 2 sprays into both nostrils daily.   gabapentin (NEURONTIN) 300 MG capsule TAKE 1 CAPSULE(300 MG) BY MOUTH AT BEDTIME   iron sucrose (VENOFER) 20 MG/ML injection Inject 50 mg into the vein once a week.   isosorbide mononitrate (IMDUR) 30 MG 24 hr tablet Take 1 tablet (30 mg total) by mouth 2 (two) times daily.    lanthanum (FOSRENOL) 1000 MG chewable tablet Chew 2,000-3,000 mg by mouth See admin instructions. Take 3 tablets (3000 mg) by mouth with meals and take 2 tablets (2000 mg) with snacks   lidocaine-prilocaine (EMLA) cream Apply 1 application  topically every Monday, Wednesday, and Friday with hemodialysis.   LOPERAMIDE HCL PO Take by mouth.   meclizine (ANTIVERT) 25 MG tablet TAKE 1 TABLET(25 MG) BY MOUTH THREE TIMES DAILY AS NEEDED FOR DIZZINESS   nebivolol (BYSTOLIC) 10 MG tablet Take 1 tablet (10 mg total) by mouth in the morning and at bedtime.   nitroGLYCERIN (NITROSTAT) 0.4 MG SL tablet Place 1 tablet (0.4 mg total) under the tongue every 5 (five) minutes x 3 doses as needed for chest pain (if no relief after 3rd dose, proceed to ED or call 911).   ondansetron (ZOFRAN) 4 MG tablet Take 1 tablet (4 mg total) by mouth every 8 (eight) hours as needed for nausea or vomiting.   ondansetron (ZOFRAN-ODT) 4 MG disintegrating tablet Take 1 tablet (4 mg total) by mouth every 8 (eight) hours as needed for nausea or vomiting.   oxyCODONE (ROXICODONE) 5 MG immediate release tablet Take 1 tablet (5 mg total) by mouth every 4 (four) hours as needed for severe pain.   pantoprazole (PROTONIX) 40 MG tablet TAKE 1 TABLET(40 MG) BY MOUTH DAILY   sucralfate (CARAFATE) 1 GM/10ML suspension Take 10 mLs (1 g total) by mouth with breakfast, with lunch, and with evening meal. (Patient taking differently: Take 1 g by mouth 3 (three) times daily as needed (stomach pain.).)   VITAMIN D PO Take 0.25 mcg by mouth daily.   [DISCONTINUED] isosorbide mononitrate (IMDUR) 30 MG 24 hr tablet Take 1.5 tablets (45 mg total) by mouth daily.     Allergies:   Patient has no known allergies.   Social History   Socioeconomic History   Marital status: Married    Spouse name: Not on file   Number of children: Not on file   Years of education: Not on file   Highest education level: Not on file  Occupational History   Occupation:  Systems developer: [employer]  Tobacco Use   Smoking status: Never    Passive exposure: Never   Smokeless tobacco: Never  Vaping Use   Vaping status: Never Used  Substance and Sexual Activity   Alcohol use: No   Drug use: No   Sexual activity: Yes    Birth control/protection: Surgical  Other Topics  Concern   Not on file  Social History Narrative   Works at [employer] in [place].    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   Also has to get items from high shelves-causes achy pain in shoulder area      Married.   Children are grown, out of house.   Social Determinants of Health   Financial Resource Strain: Low Risk  (10/10/2022)   Overall Financial Resource Strain (CARDIA)    Difficulty of Paying Living Expenses: Not hard at all  Food Insecurity: No Food Insecurity (10/10/2022)   Hunger Vital Sign    Worried About Running Out of Food in the Last Year: Never true    Ran Out of Food in the Last Year: Never true  Transportation Needs: No Transportation Needs (09/14/2022)   PRAPARE - Administrator, Civil Service (Medical): No    Lack of Transportation (Non-Medical): No  Physical Activity: Inactive (09/14/2022)   Exercise Vital Sign    Days of Exercise per Week: 0 days    Minutes of Exercise per Session: 0 min  Stress: No Stress Concern Present (09/14/2022)   [employer] of Occupational Health - Occupational Stress Questionnaire    Feeling of Stress : Not at all  Social Connections: Socially Integrated (09/14/2022)   Social Connection and Isolation Panel [NHANES]    Frequency of Communication with Friends and Family: More than three times a week    Frequency of Social Gatherings with Friends and Family: Three times a week    Attends Religious Services: More than 4 times per year    Active Member of Clubs or Organizations: Yes    Attends Engineer, structural: More than 4 times per year    Marital Status: Married     Family  History: The patient's family history includes Coronary artery disease in her mother and sister; Diabetes in her maternal aunt, maternal uncle, mother, paternal aunt, and paternal uncle; Heart attack in her father, maternal aunt, maternal grandfather, maternal grandmother, maternal uncle, paternal aunt, paternal grandfather, paternal grandmother, and paternal uncle; Hypertension in her brother, maternal aunt, maternal uncle, mother, paternal aunt, paternal uncle, sister, and son. There is no history of Colon cancer.  ROS:   Please see the history of present illness.     All other systems reviewed and are negative.  EKGs/Labs/Other Studies Reviewed:    The following studies were reviewed today:  Echo 12/2020 1. Left ventricular ejection fraction, by estimation, is 55 to 60%. The  left ventricle has normal function. The left ventricle has no regional  wall motion abnormalities. There is moderate left ventricular hypertrophy.  Left ventricular diastolic  parameters were normal. Longitudinal strain dose not track well with  myocardium, so measurements are not reported.   2. Right ventricular systolic function is normal. The right ventricular  size is normal. Tricuspid regurgitation signal is inadequate for assessing  PA pressure.   3. The mitral valve is grossly normal. Mild mitral valve regurgitation.   4. The aortic valve is tricuspid. There is mild calcification of the  aortic valve. Aortic valve regurgitation is not visualized.   5. The inferior vena cava is normal in size with greater than 50%  respiratory variability, suggesting right atrial pressure of 3 mmHg.    Echo 06/2020 1. Left ventricular ejection fraction, by estimation, is 60 to 65%. The  left ventricle has normal function. The left ventricle has no regional  wall motion abnormalities.  There is mild left ventricular hypertrophy.  Left ventricular diastolic parameters  are consistent with Grade II diastolic dysfunction  (pseudonormalization).  Elevated left ventricular end-diastolic pressure.   2. Right ventricular systolic function is normal. The right ventricular  size is normal.   3. Left atrial size was mildly dilated.   4. The mitral valve is abnormal. Mild mitral valve regurgitation. No  evidence of mitral stenosis.   5. The aortic valve is tricuspid. Aortic valve regurgitation is not  visualized. Mild aortic valve sclerosis is present, with no evidence of  aortic valve stenosis.   6. The inferior vena cava is normal in size with <50% respiratory  variability, suggesting right atrial pressure of 8 mmHg.   LHC 2020 The left ventricular systolic function is normal. LV end diastolic pressure is mildly elevated. The left ventricular ejection fraction is 55-65% by visual estimate. There is no aortic valve stenosis. Nonobstructive CAD.   Nonobstructive CAD.  Continue RF modification including BP control.   EKG:  EKG is not ordered today.    Recent Labs: 09/12/2022: B Natriuretic Peptide 130.0; Magnesium 2.5 03/30/2023: ALT 19; BUN 52; Creatinine, Ser 11.48; Hemoglobin 10.8; Platelets 215; Potassium 4.2; Sodium 137  Recent Lipid Panel    Component Value Date/Time   CHOL 141 11/25/2020 0940   TRIG 67 11/25/2020 0940   HDL 55 11/25/2020 0940   CHOLHDL 2.6 11/25/2020 0940   VLDL 16 03/01/2019 0320   LDLCALC 72 11/25/2020 0940   LDLDIRECT 105 (H) 03/02/2022 1433    Physical Exam:    VS:  BP 128/68   Pulse 82   Ht 5\' 2"  (1.575 m)   Wt 181 lb (82.1 kg)   SpO2 92%   BMI 33.11 kg/m     Wt Readings from Last 3 Encounters:  04/17/23 181 lb (82.1 kg)  04/11/23 182 lb 8 oz (82.8 kg)  04/03/23 181 lb (82.1 kg)     GEN:  Well nourished, well developed in no acute distress HEENT: Normal NECK: No JVD; No carotid bruits LYMPHATICS: No lymphadenopathy CARDIAC: RRR, no murmurs, rubs, gallops RESPIRATORY:  Clear to auscultation without rales, wheezing or rhonchi  ABDOMEN: Soft, non-tender,  non-distended MUSCULOSKELETAL:  No edema; No deformity  SKIN: Warm and dry NEUROLOGIC:  Alert and oriented x 3 PSYCHIATRIC:  Normal affect   ASSESSMENT:    1. Chest pain, unspecified type   2. Coronary artery disease involving native coronary artery of native heart, unspecified whether angina present   3. Essential hypertension   4. ESRD on dialysis (HCC)   5. Mixed hyperlipidemia    PLAN:    In order of problems listed above:  Chest pain Nonobstructive CAD Patient has a long history of chest pain with cardiac cath in 2020 showing 25% proximal RCA disease, mid LAD 10% disease, otherwise normal coronaries.  She has had multiple ER visits for chest pain with negative workup.  She had a recent ER visit for abdominal and chest pain with negative cardiac workup.  Pain has been responsive to nitro in the past, suspect possible vasospasm.  Patient reports persistent heavy chest pain, more atypical in nature.  I will check an echocardiogram and a Myoview Lexiscan.  I will increase Imdur to 30 mg twice daily.  Continue amlodipine 10 mg daily, aspirin 81 mg daily, Lipitor 20 mg daily, Bystolic 10 mg daily.  Hypertension Blood pressure today normal.  Increase Imdur as above.  Continue all other medications.  ESRD on HD Patient does dialysis Monday,  Wednesday, Friday.  Breast cancer Patient reports recurrence of breast cancer 3 times.  She reports she will see [facility] oncology tomorrow to discuss radiation and possible upcoming surgery.  Hyperlipidemia LDL 105 in 2023.  Continue Lipitor 20 mg daily.   Disposition: Follow up in 1 month(s) with MD/APP   Shared Decision Making/Informed Consent   Informed Consent   Shared Decision Making/Informed Consent The risks [chest pain, shortness of breath, cardiac arrhythmias, dizziness, blood pressure fluctuations, myocardial infarction, stroke/transient ischemic attack, nausea, vomiting, allergic reaction, radiation exposure, metallic taste sensation  and life-threatening complications (estimated to be 1 in 10,000)], benefits (risk stratification, diagnosing coronary artery disease, treatment guidance) and alternatives of a nuclear stress test were discussed in detail with Ms. Iskhakov and she agrees to proceed.       Signed, Kimberly Nieland David Stall, PA-C  04/17/2023 1:27 PM    [facility]

## 2023-04-17 NOTE — Telephone Encounter (Signed)
Spoke with pt and clarified to take Imdur 30 mg Two Times Daily. Pt voiced understanding

## 2023-04-18 DIAGNOSIS — C50919 Malignant neoplasm of unspecified site of unspecified female breast: Secondary | ICD-10-CM | POA: Diagnosis not present

## 2023-04-18 DIAGNOSIS — C7989 Secondary malignant neoplasm of other specified sites: Secondary | ICD-10-CM | POA: Diagnosis not present

## 2023-04-18 DIAGNOSIS — N186 End stage renal disease: Secondary | ICD-10-CM | POA: Diagnosis not present

## 2023-04-18 DIAGNOSIS — G63 Polyneuropathy in diseases classified elsewhere: Secondary | ICD-10-CM | POA: Diagnosis not present

## 2023-04-18 DIAGNOSIS — Z992 Dependence on renal dialysis: Secondary | ICD-10-CM | POA: Diagnosis not present

## 2023-04-18 DIAGNOSIS — C50911 Malignant neoplasm of unspecified site of right female breast: Secondary | ICD-10-CM | POA: Diagnosis not present

## 2023-04-19 DIAGNOSIS — N2581 Secondary hyperparathyroidism of renal origin: Secondary | ICD-10-CM | POA: Diagnosis not present

## 2023-04-19 DIAGNOSIS — N186 End stage renal disease: Secondary | ICD-10-CM | POA: Diagnosis not present

## 2023-04-19 DIAGNOSIS — E1129 Type 2 diabetes mellitus with other diabetic kidney complication: Secondary | ICD-10-CM | POA: Diagnosis not present

## 2023-04-19 DIAGNOSIS — D631 Anemia in chronic kidney disease: Secondary | ICD-10-CM | POA: Diagnosis not present

## 2023-04-19 DIAGNOSIS — E119 Type 2 diabetes mellitus without complications: Secondary | ICD-10-CM | POA: Diagnosis not present

## 2023-04-19 DIAGNOSIS — Z992 Dependence on renal dialysis: Secondary | ICD-10-CM | POA: Diagnosis not present

## 2023-04-20 DIAGNOSIS — D631 Anemia in chronic kidney disease: Secondary | ICD-10-CM | POA: Diagnosis not present

## 2023-04-20 DIAGNOSIS — Z7982 Long term (current) use of aspirin: Secondary | ICD-10-CM | POA: Diagnosis not present

## 2023-04-20 DIAGNOSIS — C50919 Malignant neoplasm of unspecified site of unspecified female breast: Secondary | ICD-10-CM | POA: Diagnosis not present

## 2023-04-20 DIAGNOSIS — Z510 Encounter for antineoplastic radiation therapy: Secondary | ICD-10-CM | POA: Diagnosis not present

## 2023-04-20 DIAGNOSIS — Z79811 Long term (current) use of aromatase inhibitors: Secondary | ICD-10-CM | POA: Diagnosis not present

## 2023-04-20 DIAGNOSIS — Z9013 Acquired absence of bilateral breasts and nipples: Secondary | ICD-10-CM | POA: Diagnosis not present

## 2023-04-20 DIAGNOSIS — Z9221 Personal history of antineoplastic chemotherapy: Secondary | ICD-10-CM | POA: Diagnosis not present

## 2023-04-20 DIAGNOSIS — Z9012 Acquired absence of left breast and nipple: Secondary | ICD-10-CM | POA: Diagnosis not present

## 2023-04-20 DIAGNOSIS — C50911 Malignant neoplasm of unspecified site of right female breast: Secondary | ICD-10-CM | POA: Diagnosis not present

## 2023-04-20 DIAGNOSIS — N2581 Secondary hyperparathyroidism of renal origin: Secondary | ICD-10-CM | POA: Diagnosis not present

## 2023-04-20 DIAGNOSIS — E119 Type 2 diabetes mellitus without complications: Secondary | ICD-10-CM | POA: Diagnosis not present

## 2023-04-20 DIAGNOSIS — N189 Chronic kidney disease, unspecified: Secondary | ICD-10-CM | POA: Diagnosis not present

## 2023-04-20 DIAGNOSIS — C7989 Secondary malignant neoplasm of other specified sites: Secondary | ICD-10-CM | POA: Diagnosis not present

## 2023-04-20 DIAGNOSIS — E1129 Type 2 diabetes mellitus with other diabetic kidney complication: Secondary | ICD-10-CM | POA: Diagnosis not present

## 2023-04-20 DIAGNOSIS — Z79899 Other long term (current) drug therapy: Secondary | ICD-10-CM | POA: Diagnosis not present

## 2023-04-20 DIAGNOSIS — Z992 Dependence on renal dialysis: Secondary | ICD-10-CM | POA: Diagnosis not present

## 2023-04-20 DIAGNOSIS — N186 End stage renal disease: Secondary | ICD-10-CM | POA: Diagnosis not present

## 2023-04-23 ENCOUNTER — Other Ambulatory Visit: Payer: Self-pay | Admitting: Family Medicine

## 2023-04-23 DIAGNOSIS — E119 Type 2 diabetes mellitus without complications: Secondary | ICD-10-CM | POA: Diagnosis not present

## 2023-04-23 DIAGNOSIS — C50911 Malignant neoplasm of unspecified site of right female breast: Secondary | ICD-10-CM | POA: Diagnosis not present

## 2023-04-23 DIAGNOSIS — N2581 Secondary hyperparathyroidism of renal origin: Secondary | ICD-10-CM | POA: Diagnosis not present

## 2023-04-23 DIAGNOSIS — D631 Anemia in chronic kidney disease: Secondary | ICD-10-CM | POA: Diagnosis not present

## 2023-04-23 DIAGNOSIS — N189 Chronic kidney disease, unspecified: Secondary | ICD-10-CM | POA: Diagnosis not present

## 2023-04-23 DIAGNOSIS — C7989 Secondary malignant neoplasm of other specified sites: Secondary | ICD-10-CM | POA: Diagnosis not present

## 2023-04-23 DIAGNOSIS — E1129 Type 2 diabetes mellitus with other diabetic kidney complication: Secondary | ICD-10-CM | POA: Diagnosis not present

## 2023-04-23 DIAGNOSIS — Z9221 Personal history of antineoplastic chemotherapy: Secondary | ICD-10-CM | POA: Diagnosis not present

## 2023-04-23 DIAGNOSIS — Z992 Dependence on renal dialysis: Secondary | ICD-10-CM | POA: Diagnosis not present

## 2023-04-23 DIAGNOSIS — C50919 Malignant neoplasm of unspecified site of unspecified female breast: Secondary | ICD-10-CM | POA: Diagnosis not present

## 2023-04-23 DIAGNOSIS — C50811 Malignant neoplasm of overlapping sites of right female breast: Secondary | ICD-10-CM | POA: Diagnosis not present

## 2023-04-23 DIAGNOSIS — Z510 Encounter for antineoplastic radiation therapy: Secondary | ICD-10-CM | POA: Diagnosis not present

## 2023-04-23 DIAGNOSIS — N186 End stage renal disease: Secondary | ICD-10-CM | POA: Diagnosis not present

## 2023-04-24 DIAGNOSIS — C50911 Malignant neoplasm of unspecified site of right female breast: Secondary | ICD-10-CM | POA: Diagnosis not present

## 2023-04-24 NOTE — Telephone Encounter (Signed)
Requested Prescriptions  Pending Prescriptions Disp Refills   nebivolol (BYSTOLIC) 10 MG tablet [Pharmacy Med Name: NEBIVOLOL 10MG  TABLETS] 180 tablet 0    Sig: TAKE 1 TABLET BY MOUTH IN THE MORNING AND AT BEDTIME     Cardiovascular: Beta Blockers 3 Failed - 04/23/2023  8:32 AM      Failed - Cr in normal range and within 360 days    Creat  Date Value Ref Range Status  08/26/2021 6.69 (H) 0.50 - 1.03 mg/dL Final   Creatinine, Ser  Date Value Ref Range Status  03/30/2023 11.48 (H) 0.44 - 1.00 mg/dL Final         Failed - Valid encounter within last 6 months    Recent Outpatient Visits           1 year ago Medication care plan discussed with patient   [facility] Donita Brooks, MD   1 year ago Dyspnea, unspecified type   [facility] Donita Brooks, MD   2 years ago Clostridium difficile colitis   [facility] Donita Brooks, MD   2 years ago Clostridium difficile colitis   [facility] Donita Brooks, MD   2 years ago Clostridium difficile colitis   [facility] Pickard, Priscille Heidelberg, MD       Future Appointments             In 1 month Strader, Lennart Pall, PA-C [facility] [place] [facility]            Passed - AST in normal range and within 360 days    AST  Date Value Ref Range Status  03/30/2023 17 15 - 41 U/L Final         Passed - ALT in normal range and within 360 days    ALT  Date Value Ref Range Status  03/30/2023 19 0 - 44 U/L Final         Passed - Last BP in normal range    BP Readings from Last 1 Encounters:  04/17/23 128/68         Passed - Last Heart Rate in normal range    Pulse Readings from Last 1 Encounters:  04/17/23 82

## 2023-04-25 ENCOUNTER — Telehealth (HOSPITAL_COMMUNITY): Payer: Self-pay | Admitting: Emergency Medicine

## 2023-04-25 DIAGNOSIS — Z992 Dependence on renal dialysis: Secondary | ICD-10-CM | POA: Diagnosis not present

## 2023-04-25 DIAGNOSIS — D631 Anemia in chronic kidney disease: Secondary | ICD-10-CM | POA: Diagnosis not present

## 2023-04-25 DIAGNOSIS — N2581 Secondary hyperparathyroidism of renal origin: Secondary | ICD-10-CM | POA: Diagnosis not present

## 2023-04-25 DIAGNOSIS — N186 End stage renal disease: Secondary | ICD-10-CM | POA: Diagnosis not present

## 2023-04-25 DIAGNOSIS — E1129 Type 2 diabetes mellitus with other diabetic kidney complication: Secondary | ICD-10-CM | POA: Diagnosis not present

## 2023-04-25 DIAGNOSIS — E119 Type 2 diabetes mellitus without complications: Secondary | ICD-10-CM | POA: Diagnosis not present

## 2023-04-25 NOTE — Telephone Encounter (Signed)
Discussed check-in and procedure protocol. Pt verbalized understand of process.

## 2023-04-26 ENCOUNTER — Ambulatory Visit (HOSPITAL_COMMUNITY)
Admission: RE | Admit: 2023-04-26 | Discharge: 2023-04-26 | Disposition: A | Discharge status: Home / Self Care | Payer: Medicare Other | Source: Ambulatory Visit | Attending: Family Medicine | Admitting: Family Medicine

## 2023-04-26 ENCOUNTER — Encounter (HOSPITAL_COMMUNITY)
Admission: RE | Admit: 2023-04-26 | Discharge: 2023-04-26 | Disposition: A | Discharge status: Home / Self Care | Payer: Medicare Other | Source: Ambulatory Visit | Attending: Medical | Admitting: Medical

## 2023-04-26 ENCOUNTER — Encounter (HOSPITAL_COMMUNITY): Payer: Self-pay

## 2023-04-26 DIAGNOSIS — R079 Chest pain, unspecified: Secondary | ICD-10-CM | POA: Insufficient documentation

## 2023-04-26 LAB — NM MYOCAR MULTI W/SPECT W/WALL MOTION / EF
LV dias vol: 104 mL (ref 46–106)
LV sys vol: 47 mL
Nuc Stress EF: 55 %
Peak HR: 97 {beats}/min
RATE: 0.4
Rest HR: 75 {beats}/min
Rest Nuclear Isotope Dose: 10.3 mCi
SDS: 1
SRS: 1
SSS: 2
Stress Nuclear Isotope Dose: 30.3 mCi
TID: 0.84

## 2023-04-26 MED ORDER — TECHNETIUM TC 99M TETROFOSMIN IV KIT
10.0000 | PACK | Freq: Once | INTRAVENOUS | Status: AC | PRN
  Administered 2023-04-26: 10.3 via INTRAVENOUS

## 2023-04-26 MED ORDER — TECHNETIUM TC 99M TETROFOSMIN IV KIT
30.0000 | PACK | Freq: Once | INTRAVENOUS | Status: AC | PRN
  Administered 2023-04-26: 30.3 via INTRAVENOUS

## 2023-04-26 MED ORDER — SODIUM CHLORIDE FLUSH 0.9 % IV SOLN
INTRAVENOUS | Status: AC
  Administered 2023-04-26: 10 mL via INTRAVENOUS
  Filled 2023-04-26: qty 10

## 2023-04-26 MED ORDER — REGADENOSON 0.4 MG/5ML IV SOLN
INTRAVENOUS | Status: AC
  Administered 2023-04-26: 0.4 mg via INTRAVENOUS
  Filled 2023-04-26: qty 5

## 2023-04-27 DIAGNOSIS — N2581 Secondary hyperparathyroidism of renal origin: Secondary | ICD-10-CM | POA: Diagnosis not present

## 2023-04-27 DIAGNOSIS — N186 End stage renal disease: Secondary | ICD-10-CM | POA: Diagnosis not present

## 2023-04-27 DIAGNOSIS — E1129 Type 2 diabetes mellitus with other diabetic kidney complication: Secondary | ICD-10-CM | POA: Diagnosis not present

## 2023-04-27 DIAGNOSIS — D631 Anemia in chronic kidney disease: Secondary | ICD-10-CM | POA: Diagnosis not present

## 2023-04-27 DIAGNOSIS — Z992 Dependence on renal dialysis: Secondary | ICD-10-CM | POA: Diagnosis not present

## 2023-04-27 DIAGNOSIS — E119 Type 2 diabetes mellitus without complications: Secondary | ICD-10-CM | POA: Diagnosis not present

## 2023-04-30 DIAGNOSIS — N186 End stage renal disease: Secondary | ICD-10-CM | POA: Diagnosis not present

## 2023-04-30 DIAGNOSIS — Z992 Dependence on renal dialysis: Secondary | ICD-10-CM | POA: Diagnosis not present

## 2023-04-30 DIAGNOSIS — N2581 Secondary hyperparathyroidism of renal origin: Secondary | ICD-10-CM | POA: Diagnosis not present

## 2023-04-30 DIAGNOSIS — E1129 Type 2 diabetes mellitus with other diabetic kidney complication: Secondary | ICD-10-CM | POA: Diagnosis not present

## 2023-04-30 DIAGNOSIS — D631 Anemia in chronic kidney disease: Secondary | ICD-10-CM | POA: Diagnosis not present

## 2023-04-30 DIAGNOSIS — E119 Type 2 diabetes mellitus without complications: Secondary | ICD-10-CM | POA: Diagnosis not present

## 2023-05-01 ENCOUNTER — Ambulatory Visit: Payer: Medicare Other | Admitting: Thoracic Surgery (Cardiothoracic Vascular Surgery)

## 2023-05-01 DIAGNOSIS — N189 Chronic kidney disease, unspecified: Secondary | ICD-10-CM | POA: Diagnosis not present

## 2023-05-01 DIAGNOSIS — Z510 Encounter for antineoplastic radiation therapy: Secondary | ICD-10-CM | POA: Diagnosis not present

## 2023-05-01 DIAGNOSIS — C50911 Malignant neoplasm of unspecified site of right female breast: Secondary | ICD-10-CM | POA: Diagnosis not present

## 2023-05-01 DIAGNOSIS — Z9221 Personal history of antineoplastic chemotherapy: Secondary | ICD-10-CM | POA: Diagnosis not present

## 2023-05-01 DIAGNOSIS — C7989 Secondary malignant neoplasm of other specified sites: Secondary | ICD-10-CM | POA: Diagnosis not present

## 2023-05-01 DIAGNOSIS — C50811 Malignant neoplasm of overlapping sites of right female breast: Secondary | ICD-10-CM | POA: Diagnosis not present

## 2023-05-01 DIAGNOSIS — C50919 Malignant neoplasm of unspecified site of unspecified female breast: Secondary | ICD-10-CM | POA: Diagnosis not present

## 2023-05-02 DIAGNOSIS — N2581 Secondary hyperparathyroidism of renal origin: Secondary | ICD-10-CM | POA: Diagnosis not present

## 2023-05-02 DIAGNOSIS — E119 Type 2 diabetes mellitus without complications: Secondary | ICD-10-CM | POA: Diagnosis not present

## 2023-05-02 DIAGNOSIS — E1129 Type 2 diabetes mellitus with other diabetic kidney complication: Secondary | ICD-10-CM | POA: Diagnosis not present

## 2023-05-02 DIAGNOSIS — Z992 Dependence on renal dialysis: Secondary | ICD-10-CM | POA: Diagnosis not present

## 2023-05-02 DIAGNOSIS — D631 Anemia in chronic kidney disease: Secondary | ICD-10-CM | POA: Diagnosis not present

## 2023-05-02 DIAGNOSIS — N186 End stage renal disease: Secondary | ICD-10-CM | POA: Diagnosis not present

## 2023-05-03 DIAGNOSIS — E119 Type 2 diabetes mellitus without complications: Secondary | ICD-10-CM | POA: Diagnosis not present

## 2023-05-03 DIAGNOSIS — E1129 Type 2 diabetes mellitus with other diabetic kidney complication: Secondary | ICD-10-CM | POA: Diagnosis not present

## 2023-05-03 DIAGNOSIS — D631 Anemia in chronic kidney disease: Secondary | ICD-10-CM | POA: Diagnosis not present

## 2023-05-03 DIAGNOSIS — N186 End stage renal disease: Secondary | ICD-10-CM | POA: Diagnosis not present

## 2023-05-03 DIAGNOSIS — N2581 Secondary hyperparathyroidism of renal origin: Secondary | ICD-10-CM | POA: Diagnosis not present

## 2023-05-03 DIAGNOSIS — Z992 Dependence on renal dialysis: Secondary | ICD-10-CM | POA: Diagnosis not present

## 2023-05-07 DIAGNOSIS — N2581 Secondary hyperparathyroidism of renal origin: Secondary | ICD-10-CM | POA: Diagnosis not present

## 2023-05-07 DIAGNOSIS — C50811 Malignant neoplasm of overlapping sites of right female breast: Secondary | ICD-10-CM | POA: Diagnosis not present

## 2023-05-07 DIAGNOSIS — D631 Anemia in chronic kidney disease: Secondary | ICD-10-CM | POA: Diagnosis not present

## 2023-05-07 DIAGNOSIS — E1129 Type 2 diabetes mellitus with other diabetic kidney complication: Secondary | ICD-10-CM | POA: Diagnosis not present

## 2023-05-07 DIAGNOSIS — Z9221 Personal history of antineoplastic chemotherapy: Secondary | ICD-10-CM | POA: Diagnosis not present

## 2023-05-07 DIAGNOSIS — N189 Chronic kidney disease, unspecified: Secondary | ICD-10-CM | POA: Diagnosis not present

## 2023-05-07 DIAGNOSIS — C50911 Malignant neoplasm of unspecified site of right female breast: Secondary | ICD-10-CM | POA: Diagnosis not present

## 2023-05-07 DIAGNOSIS — C7989 Secondary malignant neoplasm of other specified sites: Secondary | ICD-10-CM | POA: Diagnosis not present

## 2023-05-07 DIAGNOSIS — N186 End stage renal disease: Secondary | ICD-10-CM | POA: Diagnosis not present

## 2023-05-07 DIAGNOSIS — C50919 Malignant neoplasm of unspecified site of unspecified female breast: Secondary | ICD-10-CM | POA: Diagnosis not present

## 2023-05-07 DIAGNOSIS — E119 Type 2 diabetes mellitus without complications: Secondary | ICD-10-CM | POA: Diagnosis not present

## 2023-05-07 DIAGNOSIS — Z992 Dependence on renal dialysis: Secondary | ICD-10-CM | POA: Diagnosis not present

## 2023-05-07 DIAGNOSIS — Z510 Encounter for antineoplastic radiation therapy: Secondary | ICD-10-CM | POA: Diagnosis not present

## 2023-05-08 DIAGNOSIS — N189 Chronic kidney disease, unspecified: Secondary | ICD-10-CM | POA: Diagnosis not present

## 2023-05-08 DIAGNOSIS — C50919 Malignant neoplasm of unspecified site of unspecified female breast: Secondary | ICD-10-CM | POA: Diagnosis not present

## 2023-05-08 DIAGNOSIS — Z9221 Personal history of antineoplastic chemotherapy: Secondary | ICD-10-CM | POA: Diagnosis not present

## 2023-05-08 DIAGNOSIS — C50811 Malignant neoplasm of overlapping sites of right female breast: Secondary | ICD-10-CM | POA: Diagnosis not present

## 2023-05-08 DIAGNOSIS — C7989 Secondary malignant neoplasm of other specified sites: Secondary | ICD-10-CM | POA: Diagnosis not present

## 2023-05-08 DIAGNOSIS — C50911 Malignant neoplasm of unspecified site of right female breast: Secondary | ICD-10-CM | POA: Diagnosis not present

## 2023-05-08 DIAGNOSIS — Z510 Encounter for antineoplastic radiation therapy: Secondary | ICD-10-CM | POA: Diagnosis not present

## 2023-05-09 DIAGNOSIS — C50919 Malignant neoplasm of unspecified site of unspecified female breast: Secondary | ICD-10-CM | POA: Diagnosis not present

## 2023-05-09 DIAGNOSIS — N2581 Secondary hyperparathyroidism of renal origin: Secondary | ICD-10-CM | POA: Diagnosis not present

## 2023-05-09 DIAGNOSIS — Z9221 Personal history of antineoplastic chemotherapy: Secondary | ICD-10-CM | POA: Diagnosis not present

## 2023-05-09 DIAGNOSIS — Z510 Encounter for antineoplastic radiation therapy: Secondary | ICD-10-CM | POA: Diagnosis not present

## 2023-05-09 DIAGNOSIS — C50811 Malignant neoplasm of overlapping sites of right female breast: Secondary | ICD-10-CM | POA: Diagnosis not present

## 2023-05-09 DIAGNOSIS — D631 Anemia in chronic kidney disease: Secondary | ICD-10-CM | POA: Diagnosis not present

## 2023-05-09 DIAGNOSIS — E119 Type 2 diabetes mellitus without complications: Secondary | ICD-10-CM | POA: Diagnosis not present

## 2023-05-09 DIAGNOSIS — Z992 Dependence on renal dialysis: Secondary | ICD-10-CM | POA: Diagnosis not present

## 2023-05-09 DIAGNOSIS — N186 End stage renal disease: Secondary | ICD-10-CM | POA: Diagnosis not present

## 2023-05-09 DIAGNOSIS — N189 Chronic kidney disease, unspecified: Secondary | ICD-10-CM | POA: Diagnosis not present

## 2023-05-09 DIAGNOSIS — E1129 Type 2 diabetes mellitus with other diabetic kidney complication: Secondary | ICD-10-CM | POA: Diagnosis not present

## 2023-05-09 DIAGNOSIS — C7989 Secondary malignant neoplasm of other specified sites: Secondary | ICD-10-CM | POA: Diagnosis not present

## 2023-05-09 DIAGNOSIS — C50911 Malignant neoplasm of unspecified site of right female breast: Secondary | ICD-10-CM | POA: Diagnosis not present

## 2023-05-10 DIAGNOSIS — Z9221 Personal history of antineoplastic chemotherapy: Secondary | ICD-10-CM | POA: Diagnosis not present

## 2023-05-10 DIAGNOSIS — Z510 Encounter for antineoplastic radiation therapy: Secondary | ICD-10-CM | POA: Diagnosis not present

## 2023-05-10 DIAGNOSIS — C50811 Malignant neoplasm of overlapping sites of right female breast: Secondary | ICD-10-CM | POA: Diagnosis not present

## 2023-05-10 DIAGNOSIS — N189 Chronic kidney disease, unspecified: Secondary | ICD-10-CM | POA: Diagnosis not present

## 2023-05-10 DIAGNOSIS — C50911 Malignant neoplasm of unspecified site of right female breast: Secondary | ICD-10-CM | POA: Diagnosis not present

## 2023-05-10 DIAGNOSIS — C50919 Malignant neoplasm of unspecified site of unspecified female breast: Secondary | ICD-10-CM | POA: Diagnosis not present

## 2023-05-10 DIAGNOSIS — C7989 Secondary malignant neoplasm of other specified sites: Secondary | ICD-10-CM | POA: Diagnosis not present

## 2023-05-11 DIAGNOSIS — C50919 Malignant neoplasm of unspecified site of unspecified female breast: Secondary | ICD-10-CM | POA: Diagnosis not present

## 2023-05-11 DIAGNOSIS — Z9221 Personal history of antineoplastic chemotherapy: Secondary | ICD-10-CM | POA: Diagnosis not present

## 2023-05-11 DIAGNOSIS — E119 Type 2 diabetes mellitus without complications: Secondary | ICD-10-CM | POA: Diagnosis not present

## 2023-05-11 DIAGNOSIS — C50911 Malignant neoplasm of unspecified site of right female breast: Secondary | ICD-10-CM | POA: Diagnosis not present

## 2023-05-11 DIAGNOSIS — C7989 Secondary malignant neoplasm of other specified sites: Secondary | ICD-10-CM | POA: Diagnosis not present

## 2023-05-11 DIAGNOSIS — D631 Anemia in chronic kidney disease: Secondary | ICD-10-CM | POA: Diagnosis not present

## 2023-05-11 DIAGNOSIS — Z510 Encounter for antineoplastic radiation therapy: Secondary | ICD-10-CM | POA: Diagnosis not present

## 2023-05-11 DIAGNOSIS — E1129 Type 2 diabetes mellitus with other diabetic kidney complication: Secondary | ICD-10-CM | POA: Diagnosis not present

## 2023-05-11 DIAGNOSIS — C50811 Malignant neoplasm of overlapping sites of right female breast: Secondary | ICD-10-CM | POA: Diagnosis not present

## 2023-05-11 DIAGNOSIS — Z992 Dependence on renal dialysis: Secondary | ICD-10-CM | POA: Diagnosis not present

## 2023-05-11 DIAGNOSIS — N186 End stage renal disease: Secondary | ICD-10-CM | POA: Diagnosis not present

## 2023-05-11 DIAGNOSIS — N189 Chronic kidney disease, unspecified: Secondary | ICD-10-CM | POA: Diagnosis not present

## 2023-05-11 DIAGNOSIS — N2581 Secondary hyperparathyroidism of renal origin: Secondary | ICD-10-CM | POA: Diagnosis not present

## 2023-05-14 ENCOUNTER — Ambulatory Visit: Payer: Medicare Other | Admitting: Physician Assistant

## 2023-05-14 DIAGNOSIS — Z9221 Personal history of antineoplastic chemotherapy: Secondary | ICD-10-CM | POA: Diagnosis not present

## 2023-05-14 DIAGNOSIS — Z992 Dependence on renal dialysis: Secondary | ICD-10-CM | POA: Diagnosis not present

## 2023-05-14 DIAGNOSIS — C50919 Malignant neoplasm of unspecified site of unspecified female breast: Secondary | ICD-10-CM | POA: Diagnosis not present

## 2023-05-14 DIAGNOSIS — C7989 Secondary malignant neoplasm of other specified sites: Secondary | ICD-10-CM | POA: Diagnosis not present

## 2023-05-14 DIAGNOSIS — D631 Anemia in chronic kidney disease: Secondary | ICD-10-CM | POA: Diagnosis not present

## 2023-05-14 DIAGNOSIS — E1129 Type 2 diabetes mellitus with other diabetic kidney complication: Secondary | ICD-10-CM | POA: Diagnosis not present

## 2023-05-14 DIAGNOSIS — Z510 Encounter for antineoplastic radiation therapy: Secondary | ICD-10-CM | POA: Diagnosis not present

## 2023-05-14 DIAGNOSIS — N189 Chronic kidney disease, unspecified: Secondary | ICD-10-CM | POA: Diagnosis not present

## 2023-05-14 DIAGNOSIS — C50911 Malignant neoplasm of unspecified site of right female breast: Secondary | ICD-10-CM | POA: Diagnosis not present

## 2023-05-14 DIAGNOSIS — N186 End stage renal disease: Secondary | ICD-10-CM | POA: Diagnosis not present

## 2023-05-14 DIAGNOSIS — C50811 Malignant neoplasm of overlapping sites of right female breast: Secondary | ICD-10-CM | POA: Diagnosis not present

## 2023-05-14 DIAGNOSIS — N2581 Secondary hyperparathyroidism of renal origin: Secondary | ICD-10-CM | POA: Diagnosis not present

## 2023-05-14 DIAGNOSIS — E119 Type 2 diabetes mellitus without complications: Secondary | ICD-10-CM | POA: Diagnosis not present

## 2023-05-14 NOTE — Progress Notes (Signed)
[facility] [address], [place] [address]    Clinic Day:  05/14/2023  Referring physician: Donita Brooks, MD  Patient Care Team: Donita Brooks, MD as PCP - General (Family Medicine) [name] [name], MD as PCP - Cardiology (Cardiology) [name], MD (Gastroenterology) [name], MD as Consulting Physician (Cardiology) [name], MD as Medical Oncologist (Medical Oncology) [name], MD as Consulting Physician (Nephrology)   ASSESSMENT & PLAN:   Assessment: 1.  Stage Ib (T1CN0) right breast cancer, ER positive, PR and HER-2 negative: -Right lumpectomy and sentinel lymph node biopsy on 09/18/2018 shows 1.5 cm IDC, grade 3, associated with high-grade DCIS, negative margins, 0/3 lymph nodes positive, ER 50%, PR negative and HER-2 negative, Ki-67 60%. -Oncotype DX recurrence score 47.  Distant recurrence at 9 years with tamoxifen alone is 36%.  Absolute chemotherapy benefit is more than 15%. -Adjuvant chemotherapy with 4 cycles of AC from 10/30/2018 through 01/01/2019. -She was started on anastrozole in June 2020. -Mammogram on 09/02/2019 showed calcifications in the posterior aspect of upper outer quadrant of the right breast. -Right partial mastectomy on 02/18/2020 shows high-grade DCIS, 1.5 cm, no invasive carcinoma, resection margins negative.  0/11 lymph nodes.  ER 30% positive, PR negative. -Left simple mastectomy on 06/15/2020 was benign.  Right breast biopsy shows microscopic focus of invasive ductal carcinoma in the background of extensive high-grade DCIS. -Right breast lumpectomy on 07/13/2020 shows multifocal invasive ductal carcinoma, grade 3, largest measuring 1.2 cm.  Extensive high-grade DCIS with necrosis.  Margins are free.  PT1CNX.  There are multiple foci of invasive carcinoma arising from extensive DCIS.  Several small foci of invasive tumor arising from DCIS indicating new primaries.  ER/PR/HER-2 is negative.   Ki-67 is 20%. -PET scan on 09/14/2020 shows areas of nodularity with discrete nodule in the fat of the inferior chest wall within the subcutaneous tissues. -Ultrasound of the right chest wall shows masslike abnormality in the upper inner aspect of the right anterior chest at 1 o'clock position measuring 4.1 cm in greatest dimension.  1.7 cm hypoechoic mass in the central anterior right chest concerning for malignancy.  Possible borderline enlarged residual right axillary lymph node. -Right breast biopsy on 10/12/2020 shows 1.3 cm invasive ductal carcinoma, focally 0.1 cm from superior margin.  DCIS is less than 0.1 cm from superior margin.  PT1CPNX. - Right mastectomy followed by radiation therapy completed in May 2022. - PET scan (11/30/2022): Recurrent/metastatic disease within the right medial pectoralis musculature. - Biopsy (12/19/2022): Metastatic breast adenocarcinoma.  ER positive, 40%, weak staining.  PR negative.  Ki-67 30%.  HER2 IHC 0. - Right chest wall mass resection on 01/18/2023.  Pathology shows 1.7 cm IDC, grade 3.  DCIS with high-grade with necrosis.  Carcinoma involves inked inferior margin with focally invading into atrophic skeletal muscle. - As the mass was involving intercostal muscle, it could not be completely removed.  2.  Osteopenia: -Bone density on 01/20/2019 shows T score -1.9. - DEXA scan (03/24/2021) T score -2.4    Plan: 1.  Recurrent right breast cancer, ER 40%, weak staining, PR negative, HER2 negative: - She met with Dr. Dorris Fetch on 03/27/2023.  He thought that it is feasible to resect but it would require wide excision including taking ribs above and below and would require chest wall reconstruction including a myocutaneous flap. - She met with Dr. Ulice Bold. - She also talked to Dr.Wijetunga (radiation oncology) who has recommended preoperative radiotherapy, likely  given twice daily (45 Gray in 30 fractions) without a focal boost to potentially reduce the extent of  surgery and improve local control after completion of surgery.  This was also discussed with Dr. Dorris Fetch who did not necessarily think that the extent of his surgical approach will change after receiving preoperative radiation. - Patient and her husband are preferring preoperative radiation followed by surgery. - I will reach out to Dr. Langston Masker and make a plan.   2.  Osteopenia/vitamin D deficiency: - Continue Nephro-Vite.   3.  ESRD on HD: - Continue HD on Monday/Wednesday/Friday under the direction of Dr. Arrie Aran.   4.  Neuropathy: - Continue gabapentin at bedtime.   5.  Chest wall pain: - Continue oxycodone 5 mg daily as needed.    No orders of the defined types were placed in this encounter.    [name],acting as a Neurosurgeon for [name], MD.,have documented all relevant documentation on the behalf of [name], MD,as directed by  [name], MD while in the presence of [name], MD.  ***   [place] R Teague   9/30/20248:55 PM  CHIEF COMPLAINT:   Diagnosis: recurrent right breast cancer    Cancer Staging  Malignant neoplasm of upper-inner quadrant of right female breast [facility] Staging form: Breast, AJCC 8th Edition - Clinical stage from 09/26/2018: Stage IB (cT1c, cN0, cM0, G3, ER+, PR-, HER2-) - Signed by [name], MD on 09/26/2018    Prior Therapy: 1. Right lumpectomy and sentinel lymph node biopsy on 09/18/2018  2. Adjuvant chemotherapy with 4 cycles of AC from 10/30/2018 through 01/01/2019  3. Right partial mastectomy on 02/18/2020  4. Right breast lumpectomy on 07/13/2020  5. Right mastectomy followed by radiation therapy completed in May 2022   Current Therapy:  anastrozole started June 2020    HISTORY OF PRESENT ILLNESS:   Oncology History  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)  09/18/2018 Initial Diagnosis   Malignant neoplasm of upper-inner quadrant of right female  breast (HCC)   09/26/2018 Cancer Staging   Staging form: Breast, AJCC 8th Edition - Clinical stage from 09/26/2018: Stage IB (cT1c, cN0, cM0, G3, ER+, PR-, HER2-) - Signed by [name], MD on 09/26/2018   10/30/2018 - 01/01/2019 Chemotherapy   Patient is on Treatment Plan : BREAST Adjuvant AC q21d        INTERVAL HISTORY:   Megan [name] is a 62 y.o. female presenting to clinic today for follow up of recurrent right breast cancer. She was last seen by me on 04/11/23.  Since her last visit, she received surgical pathology results from a right breast cancer excision on 04/24/23 that revealed: invasive ductal carcinoma involving the skeletal muscle and extends to the inked inferior margin; and grade 3 solid type ductal carcinoma in situ with central necrosis and microcalcifications. Tumor size is 1.7 cm. Surgical pathology of the right breast deep margin revealed: invasive ductal carcinoma involving unoriented fragments of fibrous and fibroadipose tissue.   Today, she states that she is doing well overall. Her appetite level is at ***%. Her energy level is at ***%.  PAST MEDICAL HISTORY:   Past Medical History: Past Medical History:  Diagnosis Date   (HFpEF) heart failure with preserved ejection fraction (HCC)    a. 01/2019 Echo: EF 55-60%, mild conc LVH. DD.  Torn MV chordae.   Anemia    Atypical chest pain    a. 08/2018 MV: EF 59%, no ischemia; b. 02/2019 Cath: nonobs  dzs.   Blood transfusion without reported diagnosis    Breast cancer (HCC) 10/12/2020   Cataract    ESRD (end stage renal disease) on dialysis [facility])    a. HD T, T, S   Essential hypertension, benign    GERD (gastroesophageal reflux disease)    Headache    Hemorrhoids    Mixed hyperlipidemia    Morbid obesity (HCC)    Non-obstructive CAD (coronary artery disease)    a. 02/2019 CathL LM nl, LAD 85m, LCX nl, RCA 25p, EF 55-65%.   PONV (postoperative nausea and vomiting)    Renal insufficiency    S/P colonoscopy 08/2009    Dr. Elnoria Howard: sessile polyp (benign lymphoid), large hemorrhoids, repeat 5-10 years   Temporal arteritis (HCC)    Type 2 diabetes mellitus (HCC)    Wears glasses     Surgical History: Past Surgical History:  Procedure Laterality Date   ABDOMINAL HYSTERECTOMY     APPENDECTOMY     ARTERY BIOPSY N/A 05/09/2018   Procedure: RIGHT TEMPORAL ARTERY BIOPSY;  Surgeon: Jimmye Norman, MD;  Location: [facility] OR;  Service: General;  Laterality: N/A;   BREAST BIOPSY Right 06/15/2020   Procedure: RIGHT BREAST BIOPSY;  Surgeon: Franky Macho, MD;  Location: [facility] ORS;  Service: General;  Laterality: Right;   CATARACT EXTRACTION W/PHACO Left 02/09/2017   Procedure: CATARACT EXTRACTION PHACO AND INTRAOCULAR LENS PLACEMENT LEFT EYE;  Surgeon: Gemma Payor, MD;  Location: [facility];  Service: Ophthalmology;  Laterality: Left;  CDE: 4.89   CATARACT EXTRACTION W/PHACO Right 06/04/2017   Procedure: CATARACT EXTRACTION PHACO AND INTRAOCULAR LENS PLACEMENT (IOC);  Surgeon: Gemma Payor, MD;  Location: [facility] OR[facility];  Service: Ophthalmology;  Laterality: Right;  CDE: 4.12   CHOLECYSTECTOMY  09/29/2011   Procedure: LAPAROSCOPIC CHOLECYSTECTOMY;  Surgeon: Dalia Heading, MD;  Location: [facility];  Service: General;  Laterality: N/A;   COLONOSCOPY  08/2009   Dr. Elnoria Howard: sessile polyp (benign lymphoid), large hemorrhoids, repeat 5-10 years   COLONOSCOPY N/A 06/12/2016   prominent hemorrhoids   COLONOSCOPY WITH PROPOFOL N/A 06/16/2021   Procedure: COLONOSCOPY WITH PROPOFOL;  Surgeon: Corbin Ade, MD;  Location: [facility];  Service: Endoscopy;  Laterality: N/A;  9:30am (dialysis pt)   ESOPHAGOGASTRODUODENOSCOPY  09/05/2011   ZYS:AYTKZ hiatal hernia; remainder of exam normal. No explanation for patient's abdominal pain with today's examination   ESOPHAGOGASTRODUODENOSCOPY N/A 12/17/2013   Dr. Jena Gauss: gastric erythema, erosion, mild chronic inflammation on path    ESOPHAGOGASTRODUODENOSCOPY (EGD) WITH PROPOFOL N/A 06/16/2021   Procedure:  ESOPHAGOGASTRODUODENOSCOPY (EGD) WITH PROPOFOL;  Surgeon: Corbin Ade, MD;  Location: [facility];  Service: Endoscopy;  Laterality: N/A;   EXCISION OF BREAST BIOPSY Right 10/12/2020   Procedure: EXCISION OF RIGHT BREAST BIOPSY;  Surgeon: Franky Macho, MD;  Location: [facility];  Service: General;  Laterality: Right;   LAPAROSCOPIC APPENDECTOMY  09/29/2011   Procedure: APPENDECTOMY LAPAROSCOPIC;  Surgeon: Dalia Heading, MD;  Location: [facility];  Service: General;;  incidental appendectomy   LEFT HEART CATH AND CORONARY ANGIOGRAPHY N/A 02/28/2019   Procedure: LEFT HEART CATH AND CORONARY ANGIOGRAPHY;  Surgeon: Corky Crafts, MD;  Location: [facility] INVASIVE CV LAB;  Service: Cardiovascular;  Laterality: N/A;   MASS EXCISION Right 01/18/2023   Procedure: EXCISION MASS, RIGHT CHEST S/P MASTECTOMY;  Surgeon: Franky Macho, MD;  Location: [facility];  Service: General;  Laterality: Right;   MASTECTOMY MODIFIED RADICAL Right 02/18/2020   Procedure: MASTECTOMY MODIFIED RADICAL;  Surgeon: Franky Macho, MD;  Location:  [facility];  Service: General;  Laterality: Right;   MASTECTOMY, PARTIAL Right 07/13/2020   Procedure: RIGHT PARTIAL MASTECTOMY;  Surgeon: Franky Macho, MD;  Location: [facility];  Service: General;  Laterality: Right;   PARTIAL MASTECTOMY WITH NEEDLE LOCALIZATION AND AXILLARY SENTINEL LYMPH NODE BX Right 09/18/2018   Procedure: RIGHT PARTIAL MASTECTOMY AFTER NEEDLE LOCALIZATION, SENTINEL LYMPH NODE BIOPSY RIGHT AXILLA;  Surgeon: Franky Macho, MD;  Location: [facility];  Service: General;  Laterality: Right;   POLYPECTOMY  06/16/2021   Procedure: POLYPECTOMY;  Surgeon: Corbin Ade, MD;  [facility];  Service: Endoscopy;;   SIMPLE MASTECTOMY WITH AXILLARY SENTINEL NODE BIOPSY Left 06/15/2020   Procedure: LEFT SIMPLE MASTECTOMY;  Surgeon: Franky Macho, MD;  Location: [facility];  Service: General;  Laterality: Left;    Social History: Social History   Socioeconomic History   Marital  status: Married    Spouse name: Not on file   Number of children: Not on file   Years of education: Not on file   Highest education level: Not on file  Occupational History   Occupation: Food Corporate treasurer: [employer]  Tobacco Use   Smoking status: Never    Passive exposure: Never   Smokeless tobacco: Never  Vaping Use   Vaping status: Never Used  Substance and Sexual Activity   Alcohol use: No   Drug use: No   Sexual activity: Yes    Birth control/protection: Surgical  Other Topics Concern   Not on file  Social History Narrative   Works at [employer] in [place].    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   Also has to get items from high shelves-causes achy pain in shoulder area      Married.   Children are grown, out of house.   Social Determinants of Health   Financial Resource Strain: Low Risk  (10/10/2022)   Overall Financial Resource Strain (CARDIA)    Difficulty of Paying Living Expenses: Not hard at all  Food Insecurity: No Food Insecurity (10/10/2022)   Hunger Vital Sign    Worried About Running Out of Food in the Last Year: Never true    Ran Out of Food in the Last Year: Never true  Transportation Needs: No Transportation Needs (09/14/2022)   PRAPARE - Administrator, Civil Service (Medical): No    Lack of Transportation (Non-Medical): No  Physical Activity: Inactive (09/14/2022)   Exercise Vital Sign    Days of Exercise per Week: 0 days    Minutes of Exercise per Session: 0 min  Stress: No Stress Concern Present (09/14/2022)   [employer] of Occupational Health - Occupational Stress Questionnaire    Feeling of Stress : Not at all  Social Connections: Socially Integrated (09/14/2022)   Social Connection and Isolation Panel [NHANES]    Frequency of Communication with Friends and Family: More than three times a week    Frequency of Social Gatherings with Friends and Family: Three times a week    Attends Religious Services: More  than 4 times per year    Active Member of Clubs or Organizations: Yes    Attends Banker Meetings: More than 4 times per year    Marital Status: Married  Catering manager Violence: Not At Risk (09/14/2022)   Humiliation, Afraid, Rape, and Kick questionnaire    Fear of Current or Ex-Partner: No    Emotionally Abused: No  Physically Abused: No    Sexually Abused: No    Family History: Family History  Problem Relation Age of Onset   Hypertension Mother    Coronary artery disease Mother    Diabetes Mother    Hypertension Sister    Coronary artery disease Sister    Hypertension Brother    Heart attack Father    Hypertension Son    Heart attack Maternal Aunt    Hypertension Maternal Aunt    Diabetes Maternal Aunt    Heart attack Maternal Uncle    Hypertension Maternal Uncle    Diabetes Maternal Uncle    Heart attack Paternal Aunt    Hypertension Paternal Aunt    Diabetes Paternal Aunt    Heart attack Paternal Uncle    Hypertension Paternal Uncle    Diabetes Paternal Uncle    Heart attack Maternal Grandmother    Heart attack Maternal Grandfather    Heart attack Paternal Grandmother    Heart attack Paternal Grandfather    Colon cancer Neg Hx     Current Medications:  Current Outpatient Medications:    acetaminophen (TYLENOL) 500 MG tablet, Take 500-1,000 mg by mouth every 6 (six) hours as needed (pain.)., Disp: , Rfl:    amLODipine (NORVASC) 10 MG tablet, TAKE 1 TABLET BY MOUTH AT BEDTIME, Disp: 90 tablet, Rfl: 3   anastrozole (ARIMIDEX) 1 MG tablet, TAKE 1 TABLET BY MOUTH DAILY, Disp: 90 tablet, Rfl: 3   aspirin EC 81 MG tablet, Take 81 mg by mouth in the morning., Disp: , Rfl:    atorvastatin (LIPITOR) 20 MG tablet, TAKE 1 TABLET(20 MG) BY MOUTH DAILY, Disp: 90 tablet, Rfl: 0   B Complex-C-Zn-Folic Acid (DIALYVITE 800-ZINC 15) 0.8 MG TABS, Take 1 tablet by mouth in the morning., Disp: , Rfl:    cinacalcet (SENSIPAR) 30 MG tablet, Take 2 tablets (60 mg total)  by mouth daily. (Patient taking differently: Take 30 mg by mouth daily. With a meal), Disp: 90 tablet, Rfl: 0   Darbepoetin Alfa (ARANESP, ALBUMIN FREE, IJ), every Monday, Wednesday, and Friday with hemodialysis., Disp: , Rfl:    fluticasone (FLONASE) 50 MCG/ACT nasal spray, Place 2 sprays into both nostrils daily., Disp: 16 g, Rfl: 6   gabapentin (NEURONTIN) 300 MG capsule, TAKE 1 CAPSULE(300 MG) BY MOUTH AT BEDTIME, Disp: 90 capsule, Rfl: 1   iron sucrose (VENOFER) 20 MG/ML injection, Inject 50 mg into the vein once a week., Disp: , Rfl:    isosorbide mononitrate (IMDUR) 30 MG 24 hr tablet, Take 1 tablet (30 mg total) by mouth 2 (two) times daily., Disp: 180 tablet, Rfl: 3   lanthanum (FOSRENOL) 1000 MG chewable tablet, Chew 2,000-3,000 mg by mouth See admin instructions. Take 3 tablets (3000 mg) by mouth with meals and take 2 tablets (2000 mg) with snacks, Disp: , Rfl:    lidocaine-prilocaine (EMLA) cream, Apply 1 application  topically every Monday, Wednesday, and Friday with hemodialysis., Disp: , Rfl:    LOPERAMIDE HCL PO, Take by mouth., Disp: , Rfl:    meclizine (ANTIVERT) 25 MG tablet, TAKE 1 TABLET(25 MG) BY MOUTH THREE TIMES DAILY AS NEEDED FOR DIZZINESS, Disp: 30 tablet, Rfl: 1   nebivolol (BYSTOLIC) 10 MG tablet, TAKE 1 TABLET BY MOUTH IN THE MORNING AND AT BEDTIME, Disp: 180 tablet, Rfl: 0   nitroGLYCERIN (NITROSTAT) 0.4 MG SL tablet, Place 1 tablet (0.4 mg total) under the tongue every 5 (five) minutes x 3 doses as needed for chest pain (if no relief  after 3rd dose, proceed to ED or call 911)., Disp: 25 tablet, Rfl: 3   ondansetron (ZOFRAN) 4 MG tablet, Take 1 tablet (4 mg total) by mouth every 8 (eight) hours as needed for nausea or vomiting., Disp: 10 tablet, Rfl: 0   ondansetron (ZOFRAN-ODT) 4 MG disintegrating tablet, Take 1 tablet (4 mg total) by mouth every 8 (eight) hours as needed for nausea or vomiting., Disp: 12 tablet, Rfl: 0   oxyCODONE (ROXICODONE) 5 MG immediate release  tablet, Take 1 tablet (5 mg total) by mouth every 4 (four) hours as needed for severe pain., Disp: 25 tablet, Rfl: 0   pantoprazole (PROTONIX) 40 MG tablet, TAKE 1 TABLET(40 MG) BY MOUTH DAILY, Disp: 90 tablet, Rfl: 0   sucralfate (CARAFATE) 1 GM/10ML suspension, Take 10 mLs (1 g total) by mouth with breakfast, with lunch, and with evening meal. (Patient taking differently: Take 1 g by mouth 3 (three) times daily as needed (stomach pain.).), Disp: 420 mL, Rfl: 2   VITAMIN D PO, Take 0.25 mcg by mouth daily., Disp: , Rfl:    Allergies: No Known Allergies  REVIEW OF SYSTEMS:   Review of Systems  Constitutional:  Negative for chills, fatigue and fever.  HENT:   Negative for lump/mass, mouth sores, nosebleeds, sore throat and trouble swallowing.   Eyes:  Negative for eye problems.  Respiratory:  Negative for cough and shortness of breath.   Cardiovascular:  Negative for chest pain, leg swelling and palpitations.  Gastrointestinal:  Negative for abdominal pain, constipation, diarrhea, nausea and vomiting.  Genitourinary:  Negative for bladder incontinence, difficulty urinating, dysuria, frequency, hematuria and nocturia.   Musculoskeletal:  Negative for arthralgias, back pain, flank pain, myalgias and neck pain.  Skin:  Negative for itching and rash.  Neurological:  Negative for dizziness, headaches and numbness.  Hematological:  Does not bruise/bleed easily.  Psychiatric/Behavioral:  Negative for depression, sleep disturbance and suicidal ideas. The patient is not nervous/anxious.   All other systems reviewed and are negative.    VITALS:   There were no vitals taken for this visit.  Wt Readings from Last 3 Encounters:  04/17/23 181 lb (82.1 kg)  04/11/23 182 lb 8 oz (82.8 kg)  04/03/23 181 lb (82.1 kg)    There is no height or weight on file to calculate BMI.  Performance status (ECOG): 1 - Symptomatic but completely ambulatory  PHYSICAL EXAM:   Physical Exam Vitals and nursing  note reviewed. Exam conducted with a chaperone present.  Constitutional:      Appearance: Normal appearance.  Cardiovascular:     Rate and Rhythm: Normal rate and regular rhythm.     Pulses: Normal pulses.     Heart sounds: Normal heart sounds.  Pulmonary:     Effort: Pulmonary effort is normal.     Breath sounds: Normal breath sounds.  Abdominal:     Palpations: Abdomen is soft. There is no hepatomegaly, splenomegaly or mass.     Tenderness: There is no abdominal tenderness.  Musculoskeletal:     Right lower leg: No edema.     Left lower leg: No edema.  Lymphadenopathy:     Cervical: No cervical adenopathy.     Right cervical: No superficial, deep or posterior cervical adenopathy.    Left cervical: No superficial, deep or posterior cervical adenopathy.     Upper Body:     Right upper body: No supraclavicular or axillary adenopathy.     Left upper body: No supraclavicular or axillary adenopathy.  Neurological:     General: No focal deficit present.     Mental Status: She is alert and oriented to person, place, and time.  Psychiatric:        Mood and Affect: Mood normal.        Behavior: Behavior normal.     LABS:      Latest Ref Rng & Units 03/30/2023    6:25 AM 01/18/2023    7:12 AM 12/26/2022    9:28 AM  CBC  WBC 4.0 - 10.5 K/uL 8.3   7.3   Hemoglobin 12.0 - 15.0 g/dL 78.2  95.6  21.3   Hematocrit 36.0 - 46.0 % 33.2  34.0  34.8   Platelets 150 - 400 K/uL 215   252       Latest Ref Rng & Units 03/30/2023    6:25 AM 01/18/2023    7:12 AM 12/26/2022    9:28 AM  CMP  Glucose 70 - 99 mg/dL 086  578  469   BUN 8 - 23 mg/dL 52  46  37   Creatinine 0.44 - 1.00 mg/dL 62.95  28.41  3.24   Sodium 135 - 145 mmol/L 137  138  138   Potassium 3.5 - 5.1 mmol/L 4.2  3.4  3.9   Chloride 98 - 111 mmol/L 94  97  95   CO2 22 - 32 mmol/L 29   29   Calcium 8.9 - 10.3 mg/dL 40.1   02.7   Total Protein 6.5 - 8.1 g/dL 7.5   7.7   Total Bilirubin 0.3 - 1.2 mg/dL 0.5   0.7   Alkaline Phos  38 - 126 U/L 114   93   AST 15 - 41 U/L 17   14   ALT 0 - 44 U/L 19   21      No results found for: "CEA1", "CEA" / No results found for: "CEA1", "CEA" No results found for: "PSA1" No results found for: "OZD664" No results found for: "CAN125"  No results found for: "TOTALPROTELP", "ALBUMINELP", "A1GS", "A2GS", "BETS", "BETA2SER", "GAMS", "MSPIKE", "SPEI" Lab Results  Component Value Date   TIBC 195 (L) 02/15/2021   TIBC 190 (L) 12/10/2018   TIBC 218 (L) 06/08/2016   FERRITIN 1,742 (H) 02/15/2021   FERRITIN 1,308 (H) 12/10/2018   FERRITIN 126 06/08/2016   IRONPCTSAT 15 02/15/2021   IRONPCTSAT 43 (H) 12/10/2018   IRONPCTSAT 22 06/08/2016   No results found for: "LDH"   STUDIES:   NM Myocar Multi W/Spect W/Wall Motion / EF  Result Date: 04/26/2023   Findings are consistent with no ischemia. The study is low risk.   LV perfusion is normal.  Breast/soft tissue attenuation noted with no reversible perfusion defects to indicate ischemia.   Left ventricular function is normal. Nuclear stress EF: 55%. Low risk study with no significant ischemia and LVEF 55%.

## 2023-05-15 ENCOUNTER — Inpatient Hospital Stay: Payer: Medicare Other | Attending: Hematology | Admitting: Hematology

## 2023-05-15 VITALS — BP 127/76 | HR 77 | Temp 98.4°F | Resp 17 | Ht 62.0 in | Wt 175.1 lb

## 2023-05-15 DIAGNOSIS — C50911 Malignant neoplasm of unspecified site of right female breast: Secondary | ICD-10-CM | POA: Diagnosis not present

## 2023-05-15 DIAGNOSIS — Z79811 Long term (current) use of aromatase inhibitors: Secondary | ICD-10-CM | POA: Diagnosis not present

## 2023-05-15 DIAGNOSIS — I2510 Atherosclerotic heart disease of native coronary artery without angina pectoris: Secondary | ICD-10-CM | POA: Insufficient documentation

## 2023-05-15 DIAGNOSIS — I5032 Chronic diastolic (congestive) heart failure: Secondary | ICD-10-CM | POA: Diagnosis not present

## 2023-05-15 DIAGNOSIS — E559 Vitamin D deficiency, unspecified: Secondary | ICD-10-CM | POA: Diagnosis not present

## 2023-05-15 DIAGNOSIS — C50211 Malignant neoplasm of upper-inner quadrant of right female breast: Secondary | ICD-10-CM | POA: Diagnosis not present

## 2023-05-15 DIAGNOSIS — Z5111 Encounter for antineoplastic chemotherapy: Secondary | ICD-10-CM | POA: Diagnosis not present

## 2023-05-15 DIAGNOSIS — M8580 Other specified disorders of bone density and structure, unspecified site: Secondary | ICD-10-CM | POA: Diagnosis not present

## 2023-05-15 DIAGNOSIS — N186 End stage renal disease: Secondary | ICD-10-CM | POA: Insufficient documentation

## 2023-05-15 DIAGNOSIS — Z9011 Acquired absence of right breast and nipple: Secondary | ICD-10-CM | POA: Diagnosis not present

## 2023-05-15 DIAGNOSIS — E1129 Type 2 diabetes mellitus with other diabetic kidney complication: Secondary | ICD-10-CM | POA: Diagnosis not present

## 2023-05-15 DIAGNOSIS — D631 Anemia in chronic kidney disease: Secondary | ICD-10-CM | POA: Diagnosis not present

## 2023-05-15 DIAGNOSIS — K219 Gastro-esophageal reflux disease without esophagitis: Secondary | ICD-10-CM | POA: Insufficient documentation

## 2023-05-15 DIAGNOSIS — C7989 Secondary malignant neoplasm of other specified sites: Secondary | ICD-10-CM | POA: Diagnosis not present

## 2023-05-15 DIAGNOSIS — C50811 Malignant neoplasm of overlapping sites of right female breast: Secondary | ICD-10-CM | POA: Diagnosis not present

## 2023-05-15 DIAGNOSIS — E1136 Type 2 diabetes mellitus with diabetic cataract: Secondary | ICD-10-CM | POA: Diagnosis not present

## 2023-05-15 DIAGNOSIS — R0789 Other chest pain: Secondary | ICD-10-CM | POA: Diagnosis not present

## 2023-05-15 DIAGNOSIS — C799 Secondary malignant neoplasm of unspecified site: Secondary | ICD-10-CM | POA: Diagnosis not present

## 2023-05-15 DIAGNOSIS — E1122 Type 2 diabetes mellitus with diabetic chronic kidney disease: Secondary | ICD-10-CM | POA: Diagnosis not present

## 2023-05-15 DIAGNOSIS — N040 Nephrotic syndrome with minor glomerular abnormality: Secondary | ICD-10-CM | POA: Diagnosis not present

## 2023-05-15 DIAGNOSIS — Z170 Estrogen receptor positive status [ER+]: Secondary | ICD-10-CM | POA: Insufficient documentation

## 2023-05-15 DIAGNOSIS — Z23 Encounter for immunization: Secondary | ICD-10-CM | POA: Diagnosis not present

## 2023-05-15 DIAGNOSIS — Z992 Dependence on renal dialysis: Secondary | ICD-10-CM | POA: Diagnosis not present

## 2023-05-15 DIAGNOSIS — I132 Hypertensive heart and chronic kidney disease with heart failure and with stage 5 chronic kidney disease, or end stage renal disease: Secondary | ICD-10-CM | POA: Insufficient documentation

## 2023-05-15 DIAGNOSIS — Z923 Personal history of irradiation: Secondary | ICD-10-CM | POA: Diagnosis not present

## 2023-05-15 DIAGNOSIS — N2581 Secondary hyperparathyroidism of renal origin: Secondary | ICD-10-CM | POA: Diagnosis not present

## 2023-05-15 DIAGNOSIS — Z79899 Other long term (current) drug therapy: Secondary | ICD-10-CM | POA: Insufficient documentation

## 2023-05-15 DIAGNOSIS — E782 Mixed hyperlipidemia: Secondary | ICD-10-CM | POA: Insufficient documentation

## 2023-05-15 DIAGNOSIS — E119 Type 2 diabetes mellitus without complications: Secondary | ICD-10-CM | POA: Diagnosis not present

## 2023-05-15 NOTE — Patient Instructions (Addendum)
[facility] at [facility] Discharge Instructions   You were seen and examined today by Dr. Ellin Saba.  He discussed with you starting treatment with chemotherapy and immunotherapy after completing radiation treatments.   Treatment is given once every 3 weeks for a total of 6 times. There are 2 chemotherapy drugs given and 1 immunotherapy drug given. The chemo drugs are called Taxotere and carboplatin. The immunotherapy drug is called Keytruda.   We will arrange for Dr. Lovell Sheehan to place your port for chemotherapy administration.   SuzziPad Cold Therapy Socks & Hand Ice Pack, Cold Gloves for Chemotherapy Neuropathy.  Return as scheduled.    Thank you for choosing [facility] at [facility] to provide your oncology and hematology care.  To afford each patient quality time with our provider, please arrive at least 15 minutes before your scheduled appointment time.   If you have a lab appointment with the Cancer Center please come in thru the Main Entrance and check in at the main information desk.  You need to re-schedule your appointment should you arrive 10 or more minutes late.  We strive to give you quality time with our providers, and arriving late affects you and other patients whose appointments are after yours.  Also, if you no show three or more times for appointments you may be dismissed from the clinic at the providers discretion.     Again, thank you for choosing [facility].  Our hope is that these requests will decrease the amount of time that you wait before being seen by our physicians.       _____________________________________________________________  Should you have questions after your visit to [facility], please contact our office at [phone] and follow the prompts.  Our office hours are 8:00 a.m. and 4:30 p.m. Monday - Friday.  Please note that voicemails left after 4:00 p.m. may not be returned  until the following business day.  We are closed weekends and major holidays.  You do have access to a nurse 24-7, just call the main number to the clinic [phone] and do not press any options, hold on the line and a nurse will answer the phone.    For prescription refill requests, have your pharmacy contact our office and allow 72 hours.    Due to Covid, you will need to wear a mask upon entering the hospital. If you do not have a mask, a mask will be given to you at the Main Entrance upon arrival. For doctor visits, patients may have 1 support person age 55 or older with them. For treatment visits, patients can not have anyone with them due to social distancing guidelines and our immunocompromised population.

## 2023-05-16 ENCOUNTER — Ambulatory Visit (HOSPITAL_COMMUNITY): Payer: Medicare Other

## 2023-05-16 DIAGNOSIS — N2581 Secondary hyperparathyroidism of renal origin: Secondary | ICD-10-CM | POA: Diagnosis not present

## 2023-05-16 DIAGNOSIS — C50911 Malignant neoplasm of unspecified site of right female breast: Secondary | ICD-10-CM | POA: Diagnosis not present

## 2023-05-16 DIAGNOSIS — Z23 Encounter for immunization: Secondary | ICD-10-CM | POA: Diagnosis not present

## 2023-05-16 DIAGNOSIS — C7989 Secondary malignant neoplasm of other specified sites: Secondary | ICD-10-CM | POA: Diagnosis not present

## 2023-05-16 DIAGNOSIS — N186 End stage renal disease: Secondary | ICD-10-CM | POA: Diagnosis not present

## 2023-05-16 DIAGNOSIS — E1129 Type 2 diabetes mellitus with other diabetic kidney complication: Secondary | ICD-10-CM | POA: Diagnosis not present

## 2023-05-16 DIAGNOSIS — C50811 Malignant neoplasm of overlapping sites of right female breast: Secondary | ICD-10-CM | POA: Diagnosis not present

## 2023-05-16 DIAGNOSIS — E119 Type 2 diabetes mellitus without complications: Secondary | ICD-10-CM | POA: Diagnosis not present

## 2023-05-16 DIAGNOSIS — D631 Anemia in chronic kidney disease: Secondary | ICD-10-CM | POA: Diagnosis not present

## 2023-05-16 DIAGNOSIS — Z992 Dependence on renal dialysis: Secondary | ICD-10-CM | POA: Diagnosis not present

## 2023-05-17 ENCOUNTER — Encounter: Payer: Self-pay | Admitting: General Surgery

## 2023-05-17 ENCOUNTER — Ambulatory Visit (INDEPENDENT_AMBULATORY_CARE_PROVIDER_SITE_OTHER): Payer: Medicare Other | Admitting: General Surgery

## 2023-05-17 VITALS — BP 122/72 | HR 76 | Temp 98.0°F | Resp 14 | Ht 62.0 in | Wt 174.0 lb

## 2023-05-17 DIAGNOSIS — C50911 Malignant neoplasm of unspecified site of right female breast: Secondary | ICD-10-CM | POA: Diagnosis not present

## 2023-05-17 DIAGNOSIS — C7989 Secondary malignant neoplasm of other specified sites: Secondary | ICD-10-CM | POA: Diagnosis not present

## 2023-05-17 DIAGNOSIS — C50811 Malignant neoplasm of overlapping sites of right female breast: Secondary | ICD-10-CM | POA: Diagnosis not present

## 2023-05-17 NOTE — Progress Notes (Signed)
Megan [name]; [id number]; 14-Mar-1961   HPI Patient was referred back to my care by Dr. Ellin Saba of oncology for Port-A-Cath placement.  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  She needs IV access.  She does have a dialysis fistula in the left arm. Past Medical History:  Diagnosis Date   (HFpEF) heart failure with preserved ejection fraction (HCC)    a. 01/2019 Echo: EF 55-60%, mild conc LVH. DD.  Torn MV chordae.   Anemia    Atypical chest pain    a. 08/2018 MV: EF 59%, no ischemia; b. 02/2019 Cath: nonobs dzs.   Blood transfusion without reported diagnosis    Breast cancer (HCC) 10/12/2020   Cataract    ESRD (end stage renal disease) on dialysis [facility])    a. HD T, T, S   Essential hypertension, benign    GERD (gastroesophageal reflux disease)    Headache    Hemorrhoids    Mixed hyperlipidemia    Morbid obesity (HCC)    Non-obstructive CAD (coronary artery disease)    a. 02/2019 CathL LM nl, LAD 53m, LCX nl, RCA 25p, EF 55-65%.   PONV (postoperative nausea and vomiting)    Renal insufficiency    S/P colonoscopy 08/2009   Dr. Elnoria Howard: sessile polyp (benign lymphoid), large hemorrhoids, repeat 5-10 years   Temporal arteritis (HCC)    Type 2 diabetes mellitus (HCC)    Wears glasses     Past Surgical History:  Procedure Laterality Date   ABDOMINAL HYSTERECTOMY     APPENDECTOMY     ARTERY BIOPSY N/A 05/09/2018   Procedure: RIGHT TEMPORAL ARTERY BIOPSY;  Surgeon: Jimmye Norman, MD;  Location: [facility] OR;  Service: General;  Laterality: N/A;   BREAST BIOPSY Right 06/15/2020   Procedure: RIGHT BREAST BIOPSY;  Surgeon: Franky Macho, MD;  Location: [facility] ORS;  Service: General;  Laterality: Right;   CATARACT EXTRACTION W/PHACO Left 02/09/2017   Procedure: CATARACT EXTRACTION PHACO AND INTRAOCULAR LENS PLACEMENT LEFT EYE;  Surgeon: Gemma Payor, MD;  Location: [facility] ORS;  Service: Ophthalmology;   Laterality: Left;  CDE: 4.89   CATARACT EXTRACTION W/PHACO Right 06/04/2017   Procedure: CATARACT EXTRACTION PHACO AND INTRAOCULAR LENS PLACEMENT (IOC);  Surgeon: Gemma Payor, MD;  Location: [facility] ORS;  Service: Ophthalmology;  Laterality: Right;  CDE: 4.12   CHOLECYSTECTOMY  09/29/2011   Procedure: LAPAROSCOPIC CHOLECYSTECTOMY;  Surgeon: Dalia Heading, MD;  Location: [facility] ORS;  Service: General;  Laterality: N/A;   COLONOSCOPY  08/2009   Dr. Elnoria Howard: sessile polyp (benign lymphoid), large hemorrhoids, repeat 5-10 years   COLONOSCOPY N/A 06/12/2016   prominent hemorrhoids   COLONOSCOPY WITH PROPOFOL N/A 06/16/2021   Procedure: COLONOSCOPY WITH PROPOFOL;  Surgeon: Corbin Ade, MD;  [facility];  Service: Endoscopy;  Laterality: N/A;  9:30am (dialysis pt)   ESOPHAGOGASTRODUODENOSCOPY  09/05/2011   YQM:VHQIO hiatal hernia; remainder of exam normal. No explanation for patient's abdominal pain with today's examination   ESOPHAGOGASTRODUODENOSCOPY N/A 12/17/2013   Dr. Jena Gauss: gastric erythema, erosion, mild chronic inflammation on path    ESOPHAGOGASTRODUODENOSCOPY (EGD) WITH PROPOFOL N/A 06/16/2021   Procedure: ESOPHAGOGASTRODUODENOSCOPY (EGD) WITH PROPOFOL;  Surgeon: Corbin Ade, MD;  [facility];  Service: Endoscopy;  Laterality: N/A;   EXCISION OF BREAST BIOPSY Right 10/12/2020   Procedure: EXCISION OF RIGHT BREAST BIOPSY;  Surgeon: Franky Macho, MD;  Location: [facility];  Service: General;  Laterality: Right;  LAPAROSCOPIC APPENDECTOMY  09/29/2011   Procedure: APPENDECTOMY LAPAROSCOPIC;  Surgeon: Dalia Heading, MD;  Location: [facility];  Service: General;;  incidental appendectomy   LEFT HEART CATH AND CORONARY ANGIOGRAPHY N/A 02/28/2019   Procedure: LEFT HEART CATH AND CORONARY ANGIOGRAPHY;  Surgeon: Corky Crafts, MD;  Location: [facility] INVASIVE CV LAB;  Service: Cardiovascular;  Laterality: N/A;   MASS EXCISION Right 01/18/2023   Procedure: EXCISION MASS, RIGHT CHEST S/P  MASTECTOMY;  Surgeon: Franky Macho, MD;  Location: [facility];  Service: General;  Laterality: Right;   MASTECTOMY MODIFIED RADICAL Right 02/18/2020   Procedure: MASTECTOMY MODIFIED RADICAL;  Surgeon: Franky Macho, MD;  Location: [facility];  Service: General;  Laterality: Right;   MASTECTOMY, PARTIAL Right 07/13/2020   Procedure: RIGHT PARTIAL MASTECTOMY;  Surgeon: Franky Macho, MD;  Location: [facility];  Service: General;  Laterality: Right;   PARTIAL MASTECTOMY WITH NEEDLE LOCALIZATION AND AXILLARY SENTINEL LYMPH NODE BX Right 09/18/2018   Procedure: RIGHT PARTIAL MASTECTOMY AFTER NEEDLE LOCALIZATION, SENTINEL LYMPH NODE BIOPSY RIGHT AXILLA;  Surgeon: Franky Macho, MD;  Location: [facility];  Service: General;  Laterality: Right;   POLYPECTOMY  06/16/2021   Procedure: POLYPECTOMY;  Surgeon: Corbin Ade, MD;  [facility];  Service: Endoscopy;;   SIMPLE MASTECTOMY WITH AXILLARY SENTINEL NODE BIOPSY Left 06/15/2020   Procedure: LEFT SIMPLE MASTECTOMY;  Surgeon: Franky Macho, MD;  Location: [facility];  Service: General;  Laterality: Left;    Family History  Problem Relation Age of Onset   Hypertension Mother    Coronary artery disease Mother    Diabetes Mother    Hypertension Sister    Coronary artery disease Sister    Hypertension Brother    Heart attack Father    Hypertension Son    Heart attack Maternal Aunt    Hypertension Maternal Aunt    Diabetes Maternal Aunt    Heart attack Maternal Uncle    Hypertension Maternal Uncle    Diabetes Maternal Uncle    Heart attack Paternal Aunt    Hypertension Paternal Aunt    Diabetes Paternal Aunt    Heart attack Paternal Uncle    Hypertension Paternal Uncle    Diabetes Paternal Uncle    Heart attack Maternal Grandmother    Heart attack Maternal Grandfather    Heart attack Paternal Grandmother    Heart attack Paternal Grandfather    Colon cancer Neg Hx     Current Outpatient Medications on File Prior to Visit  Medication Sig  Dispense Refill   acetaminophen (TYLENOL) 500 MG tablet Take 500-1,000 mg by mouth every 6 (six) hours as needed (pain.).     amLODipine (NORVASC) 10 MG tablet TAKE 1 TABLET BY MOUTH AT BEDTIME 90 tablet 3   anastrozole (ARIMIDEX) 1 MG tablet TAKE 1 TABLET BY MOUTH DAILY 90 tablet 3   aspirin EC 81 MG tablet Take 81 mg by mouth in the morning.     atorvastatin (LIPITOR) 20 MG tablet TAKE 1 TABLET(20 MG) BY MOUTH DAILY 90 tablet 0   B Complex-C-Zn-Folic Acid (DIALYVITE 800-ZINC 15) 0.8 MG TABS Take 1 tablet by mouth in the morning.     cinacalcet (SENSIPAR) 30 MG tablet Take 2 tablets (60 mg total) by mouth daily. (Patient taking differently: Take 30 mg by mouth daily. With a meal) 90 tablet 0   Darbepoetin Alfa (ARANESP, ALBUMIN FREE, IJ) every Monday, Wednesday, and Friday with hemodialysis.     fluticasone (FLONASE) 50 MCG/ACT nasal spray Place 2 sprays  into both nostrils daily. 16 g 6   gabapentin (NEURONTIN) 300 MG capsule TAKE 1 CAPSULE(300 MG) BY MOUTH AT BEDTIME 90 capsule 1   iron sucrose (VENOFER) 20 MG/ML injection Inject 50 mg into the vein once a week.     isosorbide mononitrate (IMDUR) 30 MG 24 hr tablet Take 1 tablet (30 mg total) by mouth 2 (two) times daily. 180 tablet 3   lanthanum (FOSRENOL) 1000 MG chewable tablet Chew 2,000-3,000 mg by mouth See admin instructions. Take 3 tablets (3000 mg) by mouth with meals and take 2 tablets (2000 mg) with snacks     lidocaine-prilocaine (EMLA) cream Apply 1 application  topically every Monday, Wednesday, and Friday with hemodialysis.     LOPERAMIDE HCL PO Take by mouth.     meclizine (ANTIVERT) 25 MG tablet TAKE 1 TABLET(25 MG) BY MOUTH THREE TIMES DAILY AS NEEDED FOR DIZZINESS 30 tablet 1   nebivolol (BYSTOLIC) 10 MG tablet TAKE 1 TABLET BY MOUTH IN THE MORNING AND AT BEDTIME 180 tablet 0   nitroGLYCERIN (NITROSTAT) 0.4 MG SL tablet Place 1 tablet (0.4 mg total) under the tongue every 5 (five) minutes x 3 doses as needed for chest pain (if  no relief after 3rd dose, proceed to ED or call 911). 25 tablet 3   ondansetron (ZOFRAN) 4 MG tablet Take 1 tablet (4 mg total) by mouth every 8 (eight) hours as needed for nausea or vomiting. 10 tablet 0   ondansetron (ZOFRAN-ODT) 4 MG disintegrating tablet Take 1 tablet (4 mg total) by mouth every 8 (eight) hours as needed for nausea or vomiting. 12 tablet 0   oxyCODONE (ROXICODONE) 5 MG immediate release tablet Take 1 tablet (5 mg total) by mouth every 4 (four) hours as needed for severe pain. 25 tablet 0   pantoprazole (PROTONIX) 40 MG tablet TAKE 1 TABLET(40 MG) BY MOUTH DAILY 90 tablet 0   sucralfate (CARAFATE) 1 GM/10ML suspension Take 10 mLs (1 g total) by mouth with breakfast, with lunch, and with evening meal. (Patient taking differently: Take 1 g by mouth 3 (three) times daily as needed (stomach pain.).) 420 mL 2   VITAMIN D PO Take 0.25 mcg by mouth daily.     No current facility-administered medications on file prior to visit.    No Known Allergies  Social History   Substance and Sexual Activity  Alcohol Use No    Social History   Tobacco Use  Smoking Status Never   Passive exposure: Never  Smokeless Tobacco Never    Review of Systems  Constitutional:  Positive for malaise/fatigue.  HENT: Negative.    Eyes: Negative.   Respiratory: Negative.    Cardiovascular: Negative.   Gastrointestinal: Negative.   Genitourinary: Negative.   Musculoskeletal: Negative.   Skin: Negative.   Neurological: Negative.   Endo/Heme/Allergies: Negative.   Psychiatric/Behavioral: Negative.      Objective   Vitals:   05/17/23 0957  BP: 122/72  Pulse: 76  Resp: 14  Temp: 98 F (36.7 C)  SpO2: 94%    Physical Exam Vitals reviewed.  Constitutional:      Appearance: Normal appearance. She is not ill-appearing.  HENT:     Head: Normocephalic and atraumatic.  Cardiovascular:     Rate and Rhythm: Normal rate and regular rhythm.     Heart sounds: Normal heart sounds. No  murmur heard.    No friction rub. No gallop.  Pulmonary:     Effort: Pulmonary effort is normal. No respiratory distress.  Breath sounds: Normal breath sounds. No stridor. No wheezing, rhonchi or rales.  Skin:    General: Skin is warm and dry.  Neurological:     Mental Status: She is alert and oriented to person, place, and time.     Assessment  Recurrent right breast cancer of chest wall, need for central venous access Plan  Patient is scheduled for Port-A-Cath insertion on the right side on 06/07/2023.  The risks and benefits of the procedure including bleeding, infection, and the possibility of a pneumothorax were fully explained to the patient, who gave informed consent.

## 2023-05-17 NOTE — H&P (Signed)
Megan [name]; [id number]; 14-Mar-1961   HPI Patient was referred back to my care by Dr. Ellin Saba of oncology for Port-A-Cath placement.  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  She needs IV access.  She does have a dialysis fistula in the left arm. Past Medical History:  Diagnosis Date   (HFpEF) heart failure with preserved ejection fraction (HCC)    a. 01/2019 Echo: EF 55-60%, mild conc LVH. DD.  Torn MV chordae.   Anemia    Atypical chest pain    a. 08/2018 MV: EF 59%, no ischemia; b. 02/2019 Cath: nonobs dzs.   Blood transfusion without reported diagnosis    Breast cancer (HCC) 10/12/2020   Cataract    ESRD (end stage renal disease) on dialysis [facility])    a. HD T, T, S   Essential hypertension, benign    GERD (gastroesophageal reflux disease)    Headache    Hemorrhoids    Mixed hyperlipidemia    Morbid obesity (HCC)    Non-obstructive CAD (coronary artery disease)    a. 02/2019 CathL LM nl, LAD 53m, LCX nl, RCA 25p, EF 55-65%.   PONV (postoperative nausea and vomiting)    Renal insufficiency    S/P colonoscopy 08/2009   Dr. Elnoria Howard: sessile polyp (benign lymphoid), large hemorrhoids, repeat 5-10 years   Temporal arteritis (HCC)    Type 2 diabetes mellitus (HCC)    Wears glasses     Past Surgical History:  Procedure Laterality Date   ABDOMINAL HYSTERECTOMY     APPENDECTOMY     ARTERY BIOPSY N/A 05/09/2018   Procedure: RIGHT TEMPORAL ARTERY BIOPSY;  Surgeon: Jimmye Norman, MD;  Location: [facility] OR;  Service: General;  Laterality: N/A;   BREAST BIOPSY Right 06/15/2020   Procedure: RIGHT BREAST BIOPSY;  Surgeon: Franky Macho, MD;  Location: [facility];  Service: General;  Laterality: Right;   CATARACT EXTRACTION W/PHACO Left 02/09/2017   Procedure: CATARACT EXTRACTION PHACO AND INTRAOCULAR LENS PLACEMENT LEFT EYE;  Surgeon: Gemma Payor, MD;  Location: [facility];  Service: Ophthalmology;   Laterality: Left;  CDE: 4.89   CATARACT EXTRACTION W/PHACO Right 06/04/2017   Procedure: CATARACT EXTRACTION PHACO AND INTRAOCULAR LENS PLACEMENT (IOC);  Surgeon: Gemma Payor, MD;  Location: [facility];  Service: Ophthalmology;  Laterality: Right;  CDE: 4.12   CHOLECYSTECTOMY  09/29/2011   Procedure: LAPAROSCOPIC CHOLECYSTECTOMY;  Surgeon: Dalia Heading, MD;  Location: [facility];  Service: General;  Laterality: N/A;   COLONOSCOPY  08/2009   Dr. Elnoria Howard: sessile polyp (benign lymphoid), large hemorrhoids, repeat 5-10 years   COLONOSCOPY N/A 06/12/2016   prominent hemorrhoids   COLONOSCOPY WITH PROPOFOL N/A 06/16/2021   Procedure: COLONOSCOPY WITH PROPOFOL;  Surgeon: Corbin Ade, MD;  [facility];  Service: Endoscopy;  Laterality: N/A;  9:30am (dialysis pt)   ESOPHAGOGASTRODUODENOSCOPY  09/05/2011   YQM:VHQIO hiatal hernia; remainder of exam normal. No explanation for patient's abdominal pain with today's examination   ESOPHAGOGASTRODUODENOSCOPY N/A 12/17/2013   Dr. Jena Gauss: gastric erythema, erosion, mild chronic inflammation on path    ESOPHAGOGASTRODUODENOSCOPY (EGD) WITH PROPOFOL N/A 06/16/2021   Procedure: ESOPHAGOGASTRODUODENOSCOPY (EGD) WITH PROPOFOL;  Surgeon: Corbin Ade, MD;  [facility];  Service: Endoscopy;  Laterality: N/A;   EXCISION OF BREAST BIOPSY Right 10/12/2020   Procedure: EXCISION OF RIGHT BREAST BIOPSY;  Surgeon: Franky Macho, MD;  Location: [facility];  Service: General;  Laterality: Right;  LAPAROSCOPIC APPENDECTOMY  09/29/2011   Procedure: APPENDECTOMY LAPAROSCOPIC;  Surgeon: Dalia Heading, MD;  Location: [facility];  Service: General;;  incidental appendectomy   LEFT HEART CATH AND CORONARY ANGIOGRAPHY N/A 02/28/2019   Procedure: LEFT HEART CATH AND CORONARY ANGIOGRAPHY;  Surgeon: Corky Crafts, MD;  Location: [facility] INVASIVE CV LAB;  Service: Cardiovascular;  Laterality: N/A;   MASS EXCISION Right 01/18/2023   Procedure: EXCISION MASS, RIGHT CHEST S/P  MASTECTOMY;  Surgeon: Franky Macho, MD;  Location: [facility];  Service: General;  Laterality: Right;   MASTECTOMY MODIFIED RADICAL Right 02/18/2020   Procedure: MASTECTOMY MODIFIED RADICAL;  Surgeon: Franky Macho, MD;  Location: [facility];  Service: General;  Laterality: Right;   MASTECTOMY, PARTIAL Right 07/13/2020   Procedure: RIGHT PARTIAL MASTECTOMY;  Surgeon: Franky Macho, MD;  Location: [facility];  Service: General;  Laterality: Right;   PARTIAL MASTECTOMY WITH NEEDLE LOCALIZATION AND AXILLARY SENTINEL LYMPH NODE BX Right 09/18/2018   Procedure: RIGHT PARTIAL MASTECTOMY AFTER NEEDLE LOCALIZATION, SENTINEL LYMPH NODE BIOPSY RIGHT AXILLA;  Surgeon: Franky Macho, MD;  Location: [facility];  Service: General;  Laterality: Right;   POLYPECTOMY  06/16/2021   Procedure: POLYPECTOMY;  Surgeon: Corbin Ade, MD;  [facility];  Service: Endoscopy;;   SIMPLE MASTECTOMY WITH AXILLARY SENTINEL NODE BIOPSY Left 06/15/2020   Procedure: LEFT SIMPLE MASTECTOMY;  Surgeon: Franky Macho, MD;  Location: [facility];  Service: General;  Laterality: Left;    Family History  Problem Relation Age of Onset   Hypertension Mother    Coronary artery disease Mother    Diabetes Mother    Hypertension Sister    Coronary artery disease Sister    Hypertension Brother    Heart attack Father    Hypertension Son    Heart attack Maternal Aunt    Hypertension Maternal Aunt    Diabetes Maternal Aunt    Heart attack Maternal Uncle    Hypertension Maternal Uncle    Diabetes Maternal Uncle    Heart attack Paternal Aunt    Hypertension Paternal Aunt    Diabetes Paternal Aunt    Heart attack Paternal Uncle    Hypertension Paternal Uncle    Diabetes Paternal Uncle    Heart attack Maternal Grandmother    Heart attack Maternal Grandfather    Heart attack Paternal Grandmother    Heart attack Paternal Grandfather    Colon cancer Neg Hx     Current Outpatient Medications on File Prior to Visit  Medication Sig  Dispense Refill   acetaminophen (TYLENOL) 500 MG tablet Take 500-1,000 mg by mouth every 6 (six) hours as needed (pain.).     amLODipine (NORVASC) 10 MG tablet TAKE 1 TABLET BY MOUTH AT BEDTIME 90 tablet 3   anastrozole (ARIMIDEX) 1 MG tablet TAKE 1 TABLET BY MOUTH DAILY 90 tablet 3   aspirin EC 81 MG tablet Take 81 mg by mouth in the morning.     atorvastatin (LIPITOR) 20 MG tablet TAKE 1 TABLET(20 MG) BY MOUTH DAILY 90 tablet 0   B Complex-C-Zn-Folic Acid (DIALYVITE 800-ZINC 15) 0.8 MG TABS Take 1 tablet by mouth in the morning.     cinacalcet (SENSIPAR) 30 MG tablet Take 2 tablets (60 mg total) by mouth daily. (Patient taking differently: Take 30 mg by mouth daily. With a meal) 90 tablet 0   Darbepoetin Alfa (ARANESP, ALBUMIN FREE, IJ) every Monday, Wednesday, and Friday with hemodialysis.     fluticasone (FLONASE) 50 MCG/ACT nasal spray Place 2 sprays  into both nostrils daily. 16 g 6   gabapentin (NEURONTIN) 300 MG capsule TAKE 1 CAPSULE(300 MG) BY MOUTH AT BEDTIME 90 capsule 1   iron sucrose (VENOFER) 20 MG/ML injection Inject 50 mg into the vein once a week.     isosorbide mononitrate (IMDUR) 30 MG 24 hr tablet Take 1 tablet (30 mg total) by mouth 2 (two) times daily. 180 tablet 3   lanthanum (FOSRENOL) 1000 MG chewable tablet Chew 2,000-3,000 mg by mouth See admin instructions. Take 3 tablets (3000 mg) by mouth with meals and take 2 tablets (2000 mg) with snacks     lidocaine-prilocaine (EMLA) cream Apply 1 application  topically every Monday, Wednesday, and Friday with hemodialysis.     LOPERAMIDE HCL PO Take by mouth.     meclizine (ANTIVERT) 25 MG tablet TAKE 1 TABLET(25 MG) BY MOUTH THREE TIMES DAILY AS NEEDED FOR DIZZINESS 30 tablet 1   nebivolol (BYSTOLIC) 10 MG tablet TAKE 1 TABLET BY MOUTH IN THE MORNING AND AT BEDTIME 180 tablet 0   nitroGLYCERIN (NITROSTAT) 0.4 MG SL tablet Place 1 tablet (0.4 mg total) under the tongue every 5 (five) minutes x 3 doses as needed for chest pain (if  no relief after 3rd dose, proceed to ED or call 911). 25 tablet 3   ondansetron (ZOFRAN) 4 MG tablet Take 1 tablet (4 mg total) by mouth every 8 (eight) hours as needed for nausea or vomiting. 10 tablet 0   ondansetron (ZOFRAN-ODT) 4 MG disintegrating tablet Take 1 tablet (4 mg total) by mouth every 8 (eight) hours as needed for nausea or vomiting. 12 tablet 0   oxyCODONE (ROXICODONE) 5 MG immediate release tablet Take 1 tablet (5 mg total) by mouth every 4 (four) hours as needed for severe pain. 25 tablet 0   pantoprazole (PROTONIX) 40 MG tablet TAKE 1 TABLET(40 MG) BY MOUTH DAILY 90 tablet 0   sucralfate (CARAFATE) 1 GM/10ML suspension Take 10 mLs (1 g total) by mouth with breakfast, with lunch, and with evening meal. (Patient taking differently: Take 1 g by mouth 3 (three) times daily as needed (stomach pain.).) 420 mL 2   VITAMIN D PO Take 0.25 mcg by mouth daily.     No current facility-administered medications on file prior to visit.    No Known Allergies  Social History   Substance and Sexual Activity  Alcohol Use No    Social History   Tobacco Use  Smoking Status Never   Passive exposure: Never  Smokeless Tobacco Never    Review of Systems  Constitutional:  Positive for malaise/fatigue.  HENT: Negative.    Eyes: Negative.   Respiratory: Negative.    Cardiovascular: Negative.   Gastrointestinal: Negative.   Genitourinary: Negative.   Musculoskeletal: Negative.   Skin: Negative.   Neurological: Negative.   Endo/Heme/Allergies: Negative.   Psychiatric/Behavioral: Negative.      Objective   Vitals:   05/17/23 0957  BP: 122/72  Pulse: 76  Resp: 14  Temp: 98 F (36.7 C)  SpO2: 94%    Physical Exam Vitals reviewed.  Constitutional:      Appearance: Normal appearance. She is not ill-appearing.  HENT:     Head: Normocephalic and atraumatic.  Cardiovascular:     Rate and Rhythm: Normal rate and regular rhythm.     Heart sounds: Normal heart sounds. No  murmur heard.    No friction rub. No gallop.  Pulmonary:     Effort: Pulmonary effort is normal. No respiratory distress.  Breath sounds: Normal breath sounds. No stridor. No wheezing, rhonchi or rales.  Skin:    General: Skin is warm and dry.  Neurological:     Mental Status: She is alert and oriented to person, place, and time.     Assessment  Recurrent right breast cancer of chest wall, need for central venous access Plan  Patient is scheduled for Port-A-Cath insertion on the right side on 06/07/2023.  The risks and benefits of the procedure including bleeding, infection, and the possibility of a pneumothorax were fully explained to the patient, who gave informed consent.

## 2023-05-18 DIAGNOSIS — E119 Type 2 diabetes mellitus without complications: Secondary | ICD-10-CM | POA: Diagnosis not present

## 2023-05-18 DIAGNOSIS — D631 Anemia in chronic kidney disease: Secondary | ICD-10-CM | POA: Diagnosis not present

## 2023-05-18 DIAGNOSIS — N186 End stage renal disease: Secondary | ICD-10-CM | POA: Diagnosis not present

## 2023-05-18 DIAGNOSIS — N2581 Secondary hyperparathyroidism of renal origin: Secondary | ICD-10-CM | POA: Diagnosis not present

## 2023-05-18 DIAGNOSIS — C50911 Malignant neoplasm of unspecified site of right female breast: Secondary | ICD-10-CM | POA: Diagnosis not present

## 2023-05-18 DIAGNOSIS — E1129 Type 2 diabetes mellitus with other diabetic kidney complication: Secondary | ICD-10-CM | POA: Diagnosis not present

## 2023-05-18 DIAGNOSIS — C50811 Malignant neoplasm of overlapping sites of right female breast: Secondary | ICD-10-CM | POA: Diagnosis not present

## 2023-05-18 DIAGNOSIS — C7989 Secondary malignant neoplasm of other specified sites: Secondary | ICD-10-CM | POA: Diagnosis not present

## 2023-05-18 DIAGNOSIS — Z992 Dependence on renal dialysis: Secondary | ICD-10-CM | POA: Diagnosis not present

## 2023-05-21 DIAGNOSIS — Z992 Dependence on renal dialysis: Secondary | ICD-10-CM | POA: Diagnosis not present

## 2023-05-21 DIAGNOSIS — N2581 Secondary hyperparathyroidism of renal origin: Secondary | ICD-10-CM | POA: Diagnosis not present

## 2023-05-21 DIAGNOSIS — N186 End stage renal disease: Secondary | ICD-10-CM | POA: Diagnosis not present

## 2023-05-21 DIAGNOSIS — C50811 Malignant neoplasm of overlapping sites of right female breast: Secondary | ICD-10-CM | POA: Diagnosis not present

## 2023-05-21 DIAGNOSIS — E1129 Type 2 diabetes mellitus with other diabetic kidney complication: Secondary | ICD-10-CM | POA: Diagnosis not present

## 2023-05-21 DIAGNOSIS — D631 Anemia in chronic kidney disease: Secondary | ICD-10-CM | POA: Diagnosis not present

## 2023-05-21 DIAGNOSIS — E119 Type 2 diabetes mellitus without complications: Secondary | ICD-10-CM | POA: Diagnosis not present

## 2023-05-21 DIAGNOSIS — C50911 Malignant neoplasm of unspecified site of right female breast: Secondary | ICD-10-CM | POA: Diagnosis not present

## 2023-05-21 DIAGNOSIS — C7989 Secondary malignant neoplasm of other specified sites: Secondary | ICD-10-CM | POA: Diagnosis not present

## 2023-05-22 DIAGNOSIS — C7989 Secondary malignant neoplasm of other specified sites: Secondary | ICD-10-CM | POA: Diagnosis not present

## 2023-05-22 DIAGNOSIS — C50811 Malignant neoplasm of overlapping sites of right female breast: Secondary | ICD-10-CM | POA: Diagnosis not present

## 2023-05-22 DIAGNOSIS — C50911 Malignant neoplasm of unspecified site of right female breast: Secondary | ICD-10-CM | POA: Diagnosis not present

## 2023-05-23 DIAGNOSIS — E119 Type 2 diabetes mellitus without complications: Secondary | ICD-10-CM | POA: Diagnosis not present

## 2023-05-23 DIAGNOSIS — C50911 Malignant neoplasm of unspecified site of right female breast: Secondary | ICD-10-CM | POA: Diagnosis not present

## 2023-05-23 DIAGNOSIS — C50811 Malignant neoplasm of overlapping sites of right female breast: Secondary | ICD-10-CM | POA: Diagnosis not present

## 2023-05-23 DIAGNOSIS — C7989 Secondary malignant neoplasm of other specified sites: Secondary | ICD-10-CM | POA: Diagnosis not present

## 2023-05-23 DIAGNOSIS — N186 End stage renal disease: Secondary | ICD-10-CM | POA: Diagnosis not present

## 2023-05-23 DIAGNOSIS — D631 Anemia in chronic kidney disease: Secondary | ICD-10-CM | POA: Diagnosis not present

## 2023-05-23 DIAGNOSIS — Z992 Dependence on renal dialysis: Secondary | ICD-10-CM | POA: Diagnosis not present

## 2023-05-23 DIAGNOSIS — N2581 Secondary hyperparathyroidism of renal origin: Secondary | ICD-10-CM | POA: Diagnosis not present

## 2023-05-23 DIAGNOSIS — E1129 Type 2 diabetes mellitus with other diabetic kidney complication: Secondary | ICD-10-CM | POA: Diagnosis not present

## 2023-05-24 DIAGNOSIS — C7989 Secondary malignant neoplasm of other specified sites: Secondary | ICD-10-CM | POA: Diagnosis not present

## 2023-05-24 DIAGNOSIS — C50811 Malignant neoplasm of overlapping sites of right female breast: Secondary | ICD-10-CM | POA: Diagnosis not present

## 2023-05-24 DIAGNOSIS — C50911 Malignant neoplasm of unspecified site of right female breast: Secondary | ICD-10-CM | POA: Diagnosis not present

## 2023-05-25 DIAGNOSIS — E1129 Type 2 diabetes mellitus with other diabetic kidney complication: Secondary | ICD-10-CM | POA: Diagnosis not present

## 2023-05-25 DIAGNOSIS — C7989 Secondary malignant neoplasm of other specified sites: Secondary | ICD-10-CM | POA: Diagnosis not present

## 2023-05-25 DIAGNOSIS — N186 End stage renal disease: Secondary | ICD-10-CM | POA: Diagnosis not present

## 2023-05-25 DIAGNOSIS — D631 Anemia in chronic kidney disease: Secondary | ICD-10-CM | POA: Diagnosis not present

## 2023-05-25 DIAGNOSIS — C50911 Malignant neoplasm of unspecified site of right female breast: Secondary | ICD-10-CM | POA: Diagnosis not present

## 2023-05-25 DIAGNOSIS — N2581 Secondary hyperparathyroidism of renal origin: Secondary | ICD-10-CM | POA: Diagnosis not present

## 2023-05-25 DIAGNOSIS — C50811 Malignant neoplasm of overlapping sites of right female breast: Secondary | ICD-10-CM | POA: Diagnosis not present

## 2023-05-25 DIAGNOSIS — E119 Type 2 diabetes mellitus without complications: Secondary | ICD-10-CM | POA: Diagnosis not present

## 2023-05-25 DIAGNOSIS — Z992 Dependence on renal dialysis: Secondary | ICD-10-CM | POA: Diagnosis not present

## 2023-05-28 DIAGNOSIS — E1129 Type 2 diabetes mellitus with other diabetic kidney complication: Secondary | ICD-10-CM | POA: Diagnosis not present

## 2023-05-28 DIAGNOSIS — E119 Type 2 diabetes mellitus without complications: Secondary | ICD-10-CM | POA: Diagnosis not present

## 2023-05-28 DIAGNOSIS — Z992 Dependence on renal dialysis: Secondary | ICD-10-CM | POA: Diagnosis not present

## 2023-05-28 DIAGNOSIS — N186 End stage renal disease: Secondary | ICD-10-CM | POA: Diagnosis not present

## 2023-05-28 DIAGNOSIS — N2581 Secondary hyperparathyroidism of renal origin: Secondary | ICD-10-CM | POA: Diagnosis not present

## 2023-05-28 DIAGNOSIS — D631 Anemia in chronic kidney disease: Secondary | ICD-10-CM | POA: Diagnosis not present

## 2023-05-30 ENCOUNTER — Other Ambulatory Visit: Payer: Self-pay | Admitting: Family Medicine

## 2023-05-30 DIAGNOSIS — Z992 Dependence on renal dialysis: Secondary | ICD-10-CM | POA: Diagnosis not present

## 2023-05-30 DIAGNOSIS — N186 End stage renal disease: Secondary | ICD-10-CM | POA: Diagnosis not present

## 2023-05-30 DIAGNOSIS — E1129 Type 2 diabetes mellitus with other diabetic kidney complication: Secondary | ICD-10-CM | POA: Diagnosis not present

## 2023-05-30 DIAGNOSIS — E119 Type 2 diabetes mellitus without complications: Secondary | ICD-10-CM | POA: Diagnosis not present

## 2023-05-30 DIAGNOSIS — D631 Anemia in chronic kidney disease: Secondary | ICD-10-CM | POA: Diagnosis not present

## 2023-05-30 DIAGNOSIS — N2581 Secondary hyperparathyroidism of renal origin: Secondary | ICD-10-CM | POA: Diagnosis not present

## 2023-05-30 NOTE — Telephone Encounter (Signed)
Requested medication (s) are due for refill today: yes  Requested medication (s) are on the active medication list: yes    Last refill: 10/31/22  #90  1 refill  Future visit scheduled No  Notes to clinic:Failed due to labs, please review. Thank you.  Requested Prescriptions  Pending Prescriptions Disp Refills   gabapentin (NEURONTIN) 300 MG capsule [Pharmacy Med Name: GABAPENTIN 300MG  CAPSULES] 90 capsule 1    Sig: TAKE 1 CAPSULE(300 MG) BY MOUTH AT BEDTIME     Neurology: Anticonvulsants - gabapentin Failed - 05/30/2023 11:55 AM      Failed - Cr in normal range and within 360 days    Creat  Date Value Ref Range Status  08/26/2021 6.69 (H) 0.50 - 1.03 mg/dL Final   Creatinine, Ser  Date Value Ref Range Status  03/30/2023 11.48 (H) 0.44 - 1.00 mg/dL Final         Failed - Valid encounter within last 12 months    Recent Outpatient Visits           1 year ago Medication care plan discussed with patient   [facility] Donita Brooks, MD   1 year ago Dyspnea, unspecified type   [facility] Donita Brooks, MD   2 years ago Clostridium difficile colitis   [facility] Donita Brooks, MD   2 years ago Clostridium difficile colitis   [facility] Donita Brooks, MD   2 years ago Clostridium difficile colitis   [facility] Pickard, Priscille Heidelberg, MD       Future Appointments             In 2 weeks [name], MD [facility] - Completed PHQ-2 or PHQ-9 in the last 360 days

## 2023-05-31 ENCOUNTER — Encounter: Payer: Self-pay | Admitting: Gastroenterology

## 2023-05-31 ENCOUNTER — Ambulatory Visit (HOSPITAL_COMMUNITY)
Admission: RE | Admit: 2023-05-31 | Discharge: 2023-05-31 | Disposition: A | Discharge status: Home / Self Care | Payer: Medicare Other | Source: Ambulatory Visit | Attending: Hematology | Admitting: Hematology

## 2023-05-31 DIAGNOSIS — Z9011 Acquired absence of right breast and nipple: Secondary | ICD-10-CM | POA: Insufficient documentation

## 2023-05-31 DIAGNOSIS — C50911 Malignant neoplasm of unspecified site of right female breast: Secondary | ICD-10-CM | POA: Diagnosis not present

## 2023-05-31 DIAGNOSIS — C799 Secondary malignant neoplasm of unspecified site: Secondary | ICD-10-CM | POA: Insufficient documentation

## 2023-05-31 MED ORDER — FLUDEOXYGLUCOSE F - 18 (FDG) INJECTION
9.3200 | Freq: Once | INTRAVENOUS | Status: AC | PRN
  Administered 2023-05-31: 9.32 via INTRAVENOUS

## 2023-06-01 ENCOUNTER — Ambulatory Visit: Payer: Medicare Other | Admitting: Student

## 2023-06-01 DIAGNOSIS — E1129 Type 2 diabetes mellitus with other diabetic kidney complication: Secondary | ICD-10-CM | POA: Diagnosis not present

## 2023-06-01 DIAGNOSIS — D631 Anemia in chronic kidney disease: Secondary | ICD-10-CM | POA: Diagnosis not present

## 2023-06-01 DIAGNOSIS — N186 End stage renal disease: Secondary | ICD-10-CM | POA: Diagnosis not present

## 2023-06-01 DIAGNOSIS — N2581 Secondary hyperparathyroidism of renal origin: Secondary | ICD-10-CM | POA: Diagnosis not present

## 2023-06-01 DIAGNOSIS — E119 Type 2 diabetes mellitus without complications: Secondary | ICD-10-CM | POA: Diagnosis not present

## 2023-06-01 DIAGNOSIS — Z992 Dependence on renal dialysis: Secondary | ICD-10-CM | POA: Diagnosis not present

## 2023-06-04 DIAGNOSIS — D631 Anemia in chronic kidney disease: Secondary | ICD-10-CM | POA: Diagnosis not present

## 2023-06-04 DIAGNOSIS — Z992 Dependence on renal dialysis: Secondary | ICD-10-CM | POA: Diagnosis not present

## 2023-06-04 DIAGNOSIS — E119 Type 2 diabetes mellitus without complications: Secondary | ICD-10-CM | POA: Diagnosis not present

## 2023-06-04 DIAGNOSIS — E1129 Type 2 diabetes mellitus with other diabetic kidney complication: Secondary | ICD-10-CM | POA: Diagnosis not present

## 2023-06-04 DIAGNOSIS — N2581 Secondary hyperparathyroidism of renal origin: Secondary | ICD-10-CM | POA: Diagnosis not present

## 2023-06-04 DIAGNOSIS — N186 End stage renal disease: Secondary | ICD-10-CM | POA: Diagnosis not present

## 2023-06-05 ENCOUNTER — Other Ambulatory Visit: Payer: Self-pay | Admitting: Family Medicine

## 2023-06-05 ENCOUNTER — Inpatient Hospital Stay: Payer: Medicare Other

## 2023-06-05 ENCOUNTER — Encounter (HOSPITAL_COMMUNITY)
Admission: RE | Admit: 2023-06-05 | Discharge: 2023-06-05 | Disposition: A | Discharge status: Home / Self Care | Payer: Medicare Other | Source: Ambulatory Visit | Attending: General Surgery | Admitting: General Surgery

## 2023-06-05 ENCOUNTER — Encounter (HOSPITAL_COMMUNITY): Payer: Self-pay

## 2023-06-05 VITALS — Ht 62.0 in | Wt 174.0 lb

## 2023-06-05 DIAGNOSIS — N186 End stage renal disease: Secondary | ICD-10-CM

## 2023-06-06 ENCOUNTER — Other Ambulatory Visit: Payer: Self-pay

## 2023-06-06 DIAGNOSIS — N186 End stage renal disease: Secondary | ICD-10-CM | POA: Diagnosis not present

## 2023-06-06 DIAGNOSIS — E119 Type 2 diabetes mellitus without complications: Secondary | ICD-10-CM | POA: Diagnosis not present

## 2023-06-06 DIAGNOSIS — D631 Anemia in chronic kidney disease: Secondary | ICD-10-CM | POA: Diagnosis not present

## 2023-06-06 DIAGNOSIS — Z992 Dependence on renal dialysis: Secondary | ICD-10-CM | POA: Diagnosis not present

## 2023-06-06 DIAGNOSIS — E1129 Type 2 diabetes mellitus with other diabetic kidney complication: Secondary | ICD-10-CM | POA: Diagnosis not present

## 2023-06-06 DIAGNOSIS — N2581 Secondary hyperparathyroidism of renal origin: Secondary | ICD-10-CM | POA: Diagnosis not present

## 2023-06-06 DIAGNOSIS — C799 Secondary malignant neoplasm of unspecified site: Secondary | ICD-10-CM

## 2023-06-06 NOTE — Anesthesia Preprocedure Evaluation (Signed)
Anesthesia Evaluation  Patient identified by MRN, date of birth, ID band Patient awake    Reviewed: Allergy & Precautions, H&P , NPO status , Patient's Chart, lab work & pertinent test results, reviewed documented beta blocker date and time   History of Anesthesia Complications (+) PONV and history of anesthetic complications  Airway Mallampati: II  TM Distance: >3 FB Neck ROM: full    Dental no notable dental hx. (+) Dental Advisory Given   Pulmonary shortness of breath, pneumonia   Pulmonary exam normal breath sounds clear to auscultation       Cardiovascular Exercise Tolerance: Good hypertension, + CAD and +CHF  Normal cardiovascular exam Rhythm:regular Rate:Normal  Preserved EF   Neuro/Psych  Headaches  negative psych ROS   GI/Hepatic negative GI ROS, Neg liver ROS,GERD  ,,  Endo/Other  diabetes, Type 2Hypothyroidism    Renal/GU ESRF and DialysisRenal disease  negative genitourinary   Musculoskeletal   Abdominal Normal abdominal exam  (+)   Peds  Hematology negative hematology ROS (+) Blood dyscrasia, anemia   Anesthesia Other Findings Temporal arteritis  Reproductive/Obstetrics negative OB ROS                             Anesthesia Physical Anesthesia Plan  ASA: 4  Anesthesia Plan: General LMA   Post-op Pain Management:    Induction:   PONV Risk Score and Plan: Ondansetron, Dexamethasone and Scopolamine patch - Pre-op  Airway Management Planned: LMA  Additional Equipment: None  Intra-op Plan:   Post-operative Plan:   Informed Consent: I have reviewed the patients History and Physical, chart, labs and discussed the procedure including the risks, benefits and alternatives for the proposed anesthesia with the patient or authorized representative who has indicated his/her understanding and acceptance.     Dental Advisory Given  Plan Discussed with: CRNA  Anesthesia  Plan Comments:        Anesthesia Quick Evaluation

## 2023-06-06 NOTE — Telephone Encounter (Signed)
Requested medication (s) are due for refill today: Yes  Requested medication (s) are on the active medication list: Yes  Last refill:  01/12/23  Future visit scheduled: No  Notes to clinic:  Protocol indicates pt. Needs OV.     Requested Prescriptions  Pending Prescriptions Disp Refills   pantoprazole (PROTONIX) 40 MG tablet [Pharmacy Med Name: PANTOPRAZOLE 40MG  TABLETS] 90 tablet 0    Sig: TAKE 1 TABLET(40 MG) BY MOUTH DAILY     Gastroenterology: Proton Pump Inhibitors Failed - 06/05/2023  8:55 AM      Failed - Valid encounter within last 12 months    Recent Outpatient Visits           1 year ago Medication care plan discussed with patient   [facility] Donita Brooks, MD   1 year ago Dyspnea, unspecified type   [facility] Donita Brooks, MD   2 years ago Clostridium difficile colitis   [facility] Donita Brooks, MD   2 years ago Clostridium difficile colitis   [facility] Donita Brooks, MD   2 years ago Clostridium difficile colitis   [facility] Pickard, Priscille Heidelberg, MD       Future Appointments             In 1 week [name], MD [facility]

## 2023-06-07 ENCOUNTER — Ambulatory Visit (HOSPITAL_COMMUNITY)
Admission: RE | Admit: 2023-06-07 | Discharge: 2023-06-07 | Disposition: A | Discharge status: Home / Self Care | Payer: Medicare Other | Attending: General Surgery | Admitting: General Surgery

## 2023-06-07 ENCOUNTER — Ambulatory Visit (HOSPITAL_COMMUNITY): Payer: Self-pay | Admitting: Anesthesiology

## 2023-06-07 ENCOUNTER — Ambulatory Visit (HOSPITAL_BASED_OUTPATIENT_CLINIC_OR_DEPARTMENT_OTHER): Payer: Self-pay | Admitting: Anesthesiology

## 2023-06-07 ENCOUNTER — Encounter (HOSPITAL_COMMUNITY): Payer: Self-pay | Admitting: General Surgery

## 2023-06-07 ENCOUNTER — Encounter (HOSPITAL_COMMUNITY)
Admission: RE | Disposition: A | Discharge status: Home / Self Care | Payer: Self-pay | Source: Home / Self Care | Attending: General Surgery

## 2023-06-07 ENCOUNTER — Ambulatory Visit (HOSPITAL_COMMUNITY): Payer: Medicare Other

## 2023-06-07 ENCOUNTER — Other Ambulatory Visit: Payer: Self-pay | Admitting: Family Medicine

## 2023-06-07 ENCOUNTER — Other Ambulatory Visit (HOSPITAL_COMMUNITY): Payer: Medicare Other

## 2023-06-07 DIAGNOSIS — K219 Gastro-esophageal reflux disease without esophagitis: Secondary | ICD-10-CM | POA: Insufficient documentation

## 2023-06-07 DIAGNOSIS — Z853 Personal history of malignant neoplasm of breast: Secondary | ICD-10-CM | POA: Diagnosis not present

## 2023-06-07 DIAGNOSIS — I2510 Atherosclerotic heart disease of native coronary artery without angina pectoris: Secondary | ICD-10-CM | POA: Insufficient documentation

## 2023-06-07 DIAGNOSIS — E1122 Type 2 diabetes mellitus with diabetic chronic kidney disease: Secondary | ICD-10-CM | POA: Insufficient documentation

## 2023-06-07 DIAGNOSIS — C50911 Malignant neoplasm of unspecified site of right female breast: Secondary | ICD-10-CM | POA: Diagnosis not present

## 2023-06-07 DIAGNOSIS — E119 Type 2 diabetes mellitus without complications: Secondary | ICD-10-CM

## 2023-06-07 DIAGNOSIS — I5032 Chronic diastolic (congestive) heart failure: Secondary | ICD-10-CM | POA: Insufficient documentation

## 2023-06-07 DIAGNOSIS — I132 Hypertensive heart and chronic kidney disease with heart failure and with stage 5 chronic kidney disease, or end stage renal disease: Secondary | ICD-10-CM | POA: Diagnosis not present

## 2023-06-07 DIAGNOSIS — E039 Hypothyroidism, unspecified: Secondary | ICD-10-CM | POA: Diagnosis not present

## 2023-06-07 DIAGNOSIS — C7989 Secondary malignant neoplasm of other specified sites: Secondary | ICD-10-CM | POA: Diagnosis not present

## 2023-06-07 DIAGNOSIS — Z992 Dependence on renal dialysis: Secondary | ICD-10-CM | POA: Diagnosis not present

## 2023-06-07 DIAGNOSIS — Z6831 Body mass index (BMI) 31.0-31.9, adult: Secondary | ICD-10-CM | POA: Insufficient documentation

## 2023-06-07 DIAGNOSIS — Z452 Encounter for adjustment and management of vascular access device: Secondary | ICD-10-CM | POA: Diagnosis not present

## 2023-06-07 DIAGNOSIS — N186 End stage renal disease: Secondary | ICD-10-CM | POA: Insufficient documentation

## 2023-06-07 HISTORY — PX: PORTACATH PLACEMENT: SHX2246

## 2023-06-07 LAB — POCT I-STAT, CHEM 8
BUN: 35 mg/dL — ABNORMAL HIGH (ref 8–23)
Calcium, Ion: 0.97 mmol/L — ABNORMAL LOW (ref 1.15–1.40)
Chloride: 97 mmol/L — ABNORMAL LOW (ref 98–111)
Creatinine, Ser: 10.1 mg/dL — ABNORMAL HIGH (ref 0.44–1.00)
Glucose, Bld: 119 mg/dL — ABNORMAL HIGH (ref 70–99)
HCT: 31 % — ABNORMAL LOW (ref 36.0–46.0)
Hemoglobin: 10.5 g/dL — ABNORMAL LOW (ref 12.0–15.0)
Potassium: 4.7 mmol/L (ref 3.5–5.1)
Sodium: 137 mmol/L (ref 135–145)
TCO2: 31 mmol/L (ref 22–32)

## 2023-06-07 LAB — GLUCOSE, CAPILLARY
Glucose-Capillary: 111 mg/dL — ABNORMAL HIGH (ref 70–99)
Glucose-Capillary: 87 mg/dL (ref 70–99)

## 2023-06-07 SURGERY — INSERTION, TUNNELED CENTRAL VENOUS DEVICE, WITH PORT
Anesthesia: General | Site: Chest | Laterality: Right

## 2023-06-07 MED ORDER — PROPOFOL 10 MG/ML IV BOLUS
INTRAVENOUS | Status: DC | PRN
  Administered 2023-06-07: 150 mg via INTRAVENOUS

## 2023-06-07 MED ORDER — FENTANYL CITRATE (PF) 100 MCG/2ML IJ SOLN
INTRAMUSCULAR | Status: AC
  Filled 2023-06-07: qty 2

## 2023-06-07 MED ORDER — HEPARIN SOD (PORK) LOCK FLUSH 100 UNIT/ML IV SOLN
INTRAVENOUS | Status: DC | PRN
  Administered 2023-06-07: 500 [IU] via INTRAVENOUS

## 2023-06-07 MED ORDER — FENTANYL CITRATE (PF) 100 MCG/2ML IJ SOLN
INTRAMUSCULAR | Status: DC | PRN
  Administered 2023-06-07: 100 ug via INTRAVENOUS

## 2023-06-07 MED ORDER — SODIUM CHLORIDE 0.9 % IV SOLN: INTRAVENOUS | Status: DC | PRN

## 2023-06-07 MED ORDER — CHLORHEXIDINE GLUCONATE CLOTH 2 % EX PADS: 6.0000 | MEDICATED_PAD | Freq: Once | CUTANEOUS | Status: DC

## 2023-06-07 MED ORDER — CEFAZOLIN SODIUM-DEXTROSE 2-4 GM/100ML-% IV SOLN
INTRAVENOUS | Status: AC
  Filled 2023-06-07: qty 100

## 2023-06-07 MED ORDER — SODIUM CHLORIDE (PF) 0.9 % IJ SOLN
INTRAMUSCULAR | Status: DC | PRN
  Administered 2023-06-07: 500 mL

## 2023-06-07 MED ORDER — SCOPOLAMINE 1 MG/3DAYS TD PT72
MEDICATED_PATCH | TRANSDERMAL | Status: AC
  Filled 2023-06-07: qty 1

## 2023-06-07 MED ORDER — OXYCODONE HCL 5 MG PO TABS: 5.0000 mg | ORAL_TABLET | Freq: Once | ORAL | Status: DC | PRN

## 2023-06-07 MED ORDER — LIDOCAINE HCL (PF) 1 % IJ SOLN
INTRAMUSCULAR | Status: AC
  Filled 2023-06-07: qty 30

## 2023-06-07 MED ORDER — ONDANSETRON HCL 4 MG/2ML IJ SOLN: 4.0000 mg | Freq: Once | INTRAMUSCULAR | Status: DC | PRN

## 2023-06-07 MED ORDER — PHENYLEPHRINE HCL (PRESSORS) 10 MG/ML IV SOLN
INTRAVENOUS | Status: DC | PRN
  Administered 2023-06-07 (×3): 160 ug via INTRAVENOUS

## 2023-06-07 MED ORDER — CEFAZOLIN SODIUM-DEXTROSE 2-4 GM/100ML-% IV SOLN
2.0000 g | INTRAVENOUS | Status: AC
  Administered 2023-06-07: 2 g via INTRAVENOUS

## 2023-06-07 MED ORDER — ORAL CARE MOUTH RINSE: 15.0000 mL | Freq: Once | OROMUCOSAL | Status: AC

## 2023-06-07 MED ORDER — OXYCODONE HCL 5 MG/5ML PO SOLN: 5.0000 mg | Freq: Once | ORAL | Status: DC | PRN

## 2023-06-07 MED ORDER — CHLORHEXIDINE GLUCONATE 0.12 % MT SOLN
15.0000 mL | Freq: Once | OROMUCOSAL | Status: AC
  Administered 2023-06-07: 15 mL via OROMUCOSAL

## 2023-06-07 MED ORDER — ONDANSETRON HCL 4 MG/2ML IJ SOLN
INTRAMUSCULAR | Status: DC | PRN
  Administered 2023-06-07: 4 mg via INTRAVENOUS

## 2023-06-07 MED ORDER — HEPARIN SOD (PORK) LOCK FLUSH 100 UNIT/ML IV SOLN
INTRAVENOUS | Status: AC
  Filled 2023-06-07: qty 5

## 2023-06-07 MED ORDER — OXYCODONE HCL 5 MG PO TABS: 5.0000 mg | ORAL_TABLET | Freq: Four times a day (QID) | ORAL | 0 refills | Status: DC | PRN

## 2023-06-07 MED ORDER — FENTANYL CITRATE PF 50 MCG/ML IJ SOSY: 25.0000 ug | PREFILLED_SYRINGE | INTRAMUSCULAR | Status: DC | PRN

## 2023-06-07 MED ORDER — LIDOCAINE HCL (PF) 1 % IJ SOLN
INTRAMUSCULAR | Status: DC | PRN
  Administered 2023-06-07: 3 mL

## 2023-06-07 MED ORDER — SCOPOLAMINE 1 MG/3DAYS TD PT72
1.0000 | MEDICATED_PATCH | TRANSDERMAL | Status: DC
  Administered 2023-06-07: 1.5 mg via TRANSDERMAL

## 2023-06-07 MED ORDER — PROPOFOL 10 MG/ML IV BOLUS
INTRAVENOUS | Status: AC
  Filled 2023-06-07: qty 20

## 2023-06-07 SURGICAL SUPPLY — 28 items
ADH SKN CLS APL DERMABOND .7 (GAUZE/BANDAGES/DRESSINGS) ×1
APL PRP STRL LF ISPRP CHG 10.5 (MISCELLANEOUS) ×1
APPLICATOR CHLORAPREP 10.5 ORG (MISCELLANEOUS) ×1 IMPLANT
BAG DECANTER FOR FLEXI CONT (MISCELLANEOUS) ×1 IMPLANT
CLOTH BEACON ORANGE TIMEOUT ST (SAFETY) ×1 IMPLANT
COVER LIGHT HANDLE STERIS (MISCELLANEOUS) ×2 IMPLANT
DERMABOND ADVANCED .7 DNX12 (GAUZE/BANDAGES/DRESSINGS) ×1 IMPLANT
DRAPE C-ARM FOLDED MOBILE STRL (DRAPES) ×1 IMPLANT
ELECT REM PT RETURN 9FT ADLT (ELECTROSURGICAL) ×1
ELECTRODE REM PT RTRN 9FT ADLT (ELECTROSURGICAL) ×1 IMPLANT
GLOVE BIOGEL PI IND STRL 7.0 (GLOVE) ×2 IMPLANT
GLOVE SURG SS PI 7.5 STRL IVOR (GLOVE) ×2 IMPLANT
GOWN STRL REUS W/TWL LRG LVL3 (GOWN DISPOSABLE) ×2 IMPLANT
IV NS 500ML (IV SOLUTION) ×1
IV NS 500ML BAXH (IV SOLUTION) ×1 IMPLANT
KIT PORT POWER 8FR ISP MRI (Port) ×1 IMPLANT
KIT TURNOVER KIT A (KITS) ×1 IMPLANT
NDL HYPO 25X1 1.5 SAFETY (NEEDLE) ×1 IMPLANT
NEEDLE HYPO 25X1 1.5 SAFETY (NEEDLE) ×1
PACK MINOR (CUSTOM PROCEDURE TRAY) ×1 IMPLANT
PAD ARMBOARD 7.5X6 YLW CONV (MISCELLANEOUS) ×1 IMPLANT
POSITIONER HEAD 8X9X4 ADT (SOFTGOODS) ×1 IMPLANT
SET BASIN LINEN APH (SET/KITS/TRAYS/PACK) ×1 IMPLANT
SUT MNCRL AB 4-0 PS2 18 (SUTURE) ×1 IMPLANT
SUT VIC AB 3-0 SH 27 (SUTURE) ×1
SUT VIC AB 3-0 SH 27X BRD (SUTURE) ×1 IMPLANT
SYR 5ML LL (SYRINGE) ×1 IMPLANT
SYR CONTROL 10ML LL (SYRINGE) ×1 IMPLANT

## 2023-06-07 NOTE — Interval H&P Note (Signed)
History and Physical Interval Note:  06/07/2023 7:09 AM  Megan [name]  [name] presented today for surgery, with the diagnosis of RECURRENT BREAST CANCER, RIGHT.  The various methods of treatment have been discussed with the patient and family. After consideration of risks, benefits and other options for treatment, the patient [name] consented to  Procedure(s): INSERTION PORT-A-CATH, RIGHT  (DIALYSIS ACCESS ON LEFT) (Right) as a surgical intervention.  The patient's history [name] been reviewed, patient examined, no change in status, stable for surgery.  I have reviewed the patient's chart and labs.  Questions were answered to the patient's satisfaction.     Franky Macho

## 2023-06-07 NOTE — Anesthesia Postprocedure Evaluation (Signed)
Anesthesia Post Note  Patient: Megan [name]  Procedure(s) Performed: INSERTION PORT-A-CATH, RIGHT  (DIALYSIS ACCESS ON LEFT) (Right: Chest)  Patient location during evaluation: PACU Anesthesia Type: General Level of consciousness: awake and alert Pain management: pain level controlled Vital Signs Assessment: post-procedure vital signs reviewed and stable Respiratory status: spontaneous breathing, nonlabored ventilation, respiratory function stable and patient connected to nasal cannula oxygen Cardiovascular status: blood pressure returned to baseline and stable Postop Assessment: no apparent nausea or vomiting Anesthetic complications: no   There were no known notable events for this encounter.   Last Vitals:  Vitals:   06/07/23 0845 06/07/23 0857  BP: 132/68 127/75  Pulse: 73 73  Resp: 17 16  Temp:  36.8 C  SpO2: 99% 100%    Last Pain:  Vitals:   06/07/23 0857  TempSrc: Axillary  PainSc: 0-No pain                 Nimrit Kehres L Larae Caison

## 2023-06-07 NOTE — Op Note (Signed)
Patient:  Megan [name]  DOB:  [birth year]  MRN:  [id number]   Preop Diagnosis: Recurrent right breast cancer  Postop Diagnosis: Same  Procedure: Port-A-Cath insertion  Surgeon: Franky Macho, MD  Anes: General  Indications: Patient is a 62 year old black female with multiple medical problems who presents for Port-A-Cath insertion.  She is about to undergo chemotherapy for recurrent right breast cancer.  The risks and benefits of the procedure including bleeding, infection, pneumothorax were fully explained to the patient, who gave informed consent.  Procedure note: The patient was placed in Trendelenburg position after induction of general endotracheal anesthesia.  The right upper chest was prepped and draped using the usual sterile technique with ChloraPrep.  Surgical site confirmation was performed.  An incision was made below the right clavicle.  A subcutaneous pocket was formed.  The needle was advanced into the right subclavian vein using the Seldinger technique without difficulty.  The guidewire was then advanced into the right atrium under fluoroscopic guidance.  An introducer and peel-away sheath were placed over the guidewire.  The catheter was inserted through the peel-away sheath and the peel-away sheath was removed.  The catheter was then attached to the port and the port placed in subcutaneous pocket.  Adequate positioning was confirmed by fluoroscopy.  Good backflow of venous blood was noted on aspiration of the port.  The port was flushed with heparin flush.  The subcutaneous layer was reapproximated using a 3-0 Vicryl interrupted suture.  The skin was closed using a 4-0 Monocryl subcuticular suture.  1% Xylocaine was instilled into the surrounding wound.  Dermabond was then applied.  All tape and needle counts were correct at the end of the procedure.  The patient was extubated in the operating room and transferred to PACU in stable condition.  A chest x-ray will be  performed at that time.  Complications: None  EBL: Minimal  Specimen: None

## 2023-06-07 NOTE — Anesthesia Procedure Notes (Signed)
Procedure Name: LMA Insertion Date/Time: 06/07/2023 7:41 AM  Performed by: Shanon Payor, CRNAPre-anesthesia Checklist: Patient identified, Emergency Drugs available, Suction available, Patient being monitored and Timeout performed Patient Re-evaluated:Patient Re-evaluated prior to induction Oxygen Delivery Method: Circle system utilized Preoxygenation: Pre-oxygenation with 100% oxygen Induction Type: IV induction LMA: LMA inserted LMA Size: 4.0 Number of attempts: 1 Placement Confirmation: positive ETCO2, CO2 detector and breath sounds checked- equal and bilateral Tube secured with: Tape Dental Injury: Teeth and Oropharynx as per pre-operative assessment

## 2023-06-07 NOTE — Transfer of Care (Signed)
Immediate Anesthesia Transfer of Care Note  Patient: Megan [name]  Procedure(s) Performed: INSERTION PORT-A-CATH, RIGHT  (DIALYSIS ACCESS ON LEFT) (Right: Chest)  Patient Location: PACU  Anesthesia Type:General  Level of Consciousness: awake, alert , oriented, and patient cooperative  Airway & Oxygen Therapy: Patient Spontanous Breathing and Patient connected to nasal cannula oxygen  Post-op Assessment: Report given to RN, Post -op Vital signs reviewed and stable, and Patient moving all extremities X 4  Post vital signs: Reviewed and stable  Last Vitals:  Vitals Value Taken Time  BP 120/72 06/07/23 0815  Temp 97.8 06/07/23 0819  Pulse 76 06/07/23 0819  Resp 12 06/07/23 0819  SpO2 100 % 06/07/23 0819  Vitals shown include unfiled device data.  Last Pain:  Vitals:   06/07/23 0641  PainSc: 0-No pain         Complications: No notable events documented.

## 2023-06-08 ENCOUNTER — Encounter (HOSPITAL_COMMUNITY): Payer: Self-pay | Admitting: Hematology

## 2023-06-08 ENCOUNTER — Other Ambulatory Visit: Payer: Self-pay | Admitting: Family Medicine

## 2023-06-08 ENCOUNTER — Other Ambulatory Visit: Payer: Self-pay

## 2023-06-08 ENCOUNTER — Other Ambulatory Visit (HOSPITAL_COMMUNITY): Payer: Self-pay | Admitting: Hematology

## 2023-06-08 DIAGNOSIS — N2581 Secondary hyperparathyroidism of renal origin: Secondary | ICD-10-CM | POA: Diagnosis not present

## 2023-06-08 DIAGNOSIS — N186 End stage renal disease: Secondary | ICD-10-CM | POA: Diagnosis not present

## 2023-06-08 DIAGNOSIS — E1129 Type 2 diabetes mellitus with other diabetic kidney complication: Secondary | ICD-10-CM | POA: Diagnosis not present

## 2023-06-08 DIAGNOSIS — C50911 Malignant neoplasm of unspecified site of right female breast: Secondary | ICD-10-CM

## 2023-06-08 DIAGNOSIS — D631 Anemia in chronic kidney disease: Secondary | ICD-10-CM | POA: Diagnosis not present

## 2023-06-08 DIAGNOSIS — Z992 Dependence on renal dialysis: Secondary | ICD-10-CM | POA: Diagnosis not present

## 2023-06-08 DIAGNOSIS — E119 Type 2 diabetes mellitus without complications: Secondary | ICD-10-CM | POA: Diagnosis not present

## 2023-06-08 MED ORDER — MECLIZINE HCL 25 MG PO TABS: 25.0000 mg | ORAL_TABLET | Freq: Three times a day (TID) | ORAL | 1 refills | Status: DC | PRN

## 2023-06-08 MED ORDER — PANTOPRAZOLE SODIUM 40 MG PO TBEC: DELAYED_RELEASE_TABLET | ORAL | 0 refills | Status: DC

## 2023-06-08 NOTE — Telephone Encounter (Signed)
Requested medication (s) are due for refill today: Yes  Requested medication (s) are on the active medication list: Yes  Last refill:  8/8/243  Future visit scheduled: Yes  Notes to clinic:  Not delegated.    Requested Prescriptions  Pending Prescriptions Disp Refills   meclizine (ANTIVERT) 25 MG tablet [Pharmacy Med Name: MECLIZINE 25MG  RX TABLETS] 30 tablet 1    Sig: TAKE 1 TABLET(25 MG) BY MOUTH THREE TIMES DAILY AS NEEDED FOR DIZZINESS     Not Delegated - Gastroenterology: Antiemetics Failed - 06/07/2023  5:21 AM      Failed - This refill cannot be delegated      Failed - Valid encounter within last 6 months    Recent Outpatient Visits           1 year ago Medication care plan discussed with patient   [facility] Donita Brooks, MD   1 year ago Dyspnea, unspecified type   [facility] Donita Brooks, MD   2 years ago Clostridium difficile colitis   [facility] Donita Brooks, MD   2 years ago Clostridium difficile colitis   [facility] Donita Brooks, MD   2 years ago Clostridium difficile colitis   [facility] Pickard, Priscille Heidelberg, MD       Future Appointments             In 4 days Pickard, Priscille Heidelberg, MD [facility], [facility]   In 6 days [name], MD [facility]

## 2023-06-08 NOTE — Telephone Encounter (Signed)
Pt called in to request a courtesy refill of these meds. Pt is scheduled for a f/u appt with pcp on 06/12/23 and will be out of these meds before this appt. Please advise  meclizine (ANTIVERT) 25 MG tablet [[id number]] pantoprazole (PROTONIX) 40 MG tablet [[id number]]  LOV: 11/09/22  PHARMACY: [id number] - [place], [place] - [address], [place] [address] Phone: [phone]  Fax: [phone]    CB#: [phone]

## 2023-06-08 NOTE — Progress Notes (Signed)
DISCONTINUE OFF PATHWAY REGIMEN - Breast   [id number]:Doxorubicin + Cyclophosphamide (AC):   A cycle is every 21 days:     Doxorubicin      Cyclophosphamide   **Always confirm dose/schedule in your pharmacy ordering system**  REASON: Other Reason PRIOR TREATMENT: Off Pathway: Doxorubicin + Cyclophosphamide (AC) TREATMENT RESPONSE: N/A - Adjuvant Therapy  START OFF PATHWAY REGIMEN - Breast   [id number]:Carboplatin AUC=6 IV D1 + Docetaxel 75 mg/m2 IV D1 q21 Days:   A cycle is every 21 days:     Docetaxel      Carboplatin   **Always confirm dose/schedule in your pharmacy ordering system**  Patient Characteristics: Postoperative without Neoadjuvant Therapy, M0 (Pathologic Staging), Invasive Disease, Adjuvant Therapy, HER2 Negative, ER Negative, Node Negative, pT1a-c, N75mi or pT1c or Higher, pN0 Therapeutic Status: Postoperative without Neoadjuvant Therapy, M0 (Pathologic Staging) AJCC Grade: G3 AJCC N Category: pN0 AJCC M Category: cM0 ER Status: Negative (-) AJCC 8 Stage Grouping: IA HER2 Status: Negative (-) Oncotype Dx Recurrence Score: 47 AJCC T Category: pT1c PR Status: Negative (-) Intent of Therapy: Curative Intent, Discussed with Patient

## 2023-06-09 ENCOUNTER — Other Ambulatory Visit: Payer: Self-pay

## 2023-06-11 ENCOUNTER — Ambulatory Visit: Payer: Medicare Other

## 2023-06-11 ENCOUNTER — Ambulatory Visit: Payer: Medicare Other | Admitting: Hematology

## 2023-06-11 ENCOUNTER — Other Ambulatory Visit: Payer: Medicare Other

## 2023-06-11 DIAGNOSIS — E119 Type 2 diabetes mellitus without complications: Secondary | ICD-10-CM | POA: Diagnosis not present

## 2023-06-11 DIAGNOSIS — N186 End stage renal disease: Secondary | ICD-10-CM | POA: Diagnosis not present

## 2023-06-11 DIAGNOSIS — E1129 Type 2 diabetes mellitus with other diabetic kidney complication: Secondary | ICD-10-CM | POA: Diagnosis not present

## 2023-06-11 DIAGNOSIS — D631 Anemia in chronic kidney disease: Secondary | ICD-10-CM | POA: Diagnosis not present

## 2023-06-11 DIAGNOSIS — Z992 Dependence on renal dialysis: Secondary | ICD-10-CM | POA: Diagnosis not present

## 2023-06-11 DIAGNOSIS — N2581 Secondary hyperparathyroidism of renal origin: Secondary | ICD-10-CM | POA: Diagnosis not present

## 2023-06-11 NOTE — Telephone Encounter (Signed)
Request is too soon for refills,duplicate request.  Requested Prescriptions  Pending Prescriptions Disp Refills   pantoprazole (PROTONIX) 40 MG tablet [Pharmacy Med Name: PANTOPRAZOLE 40MG  TABLETS] 90 tablet     Sig: TAKE 1 TABLET(40 MG) BY MOUTH DAILY     Gastroenterology: Proton Pump Inhibitors Failed - 06/08/2023  5:01 PM      Failed - Valid encounter within last 12 months    Recent Outpatient Visits           1 year ago Medication care plan discussed with patient   [facility] Donita Brooks, MD   1 year ago Dyspnea, unspecified type   [facility] Donita Brooks, MD   2 years ago Clostridium difficile colitis   [facility] Donita Brooks, MD   2 years ago Clostridium difficile colitis   [facility] Donita Brooks, MD   2 years ago Clostridium difficile colitis   [facility] Pickard, Priscille Heidelberg, MD       Future Appointments             Tomorrow Pickard, Priscille Heidelberg, MD [facility], [facility]   In 3 days [name], MD [facility]

## 2023-06-12 ENCOUNTER — Ambulatory Visit (INDEPENDENT_AMBULATORY_CARE_PROVIDER_SITE_OTHER): Payer: Medicare Other | Admitting: Family Medicine

## 2023-06-12 ENCOUNTER — Encounter (HOSPITAL_COMMUNITY): Payer: Self-pay | Admitting: General Surgery

## 2023-06-12 ENCOUNTER — Ambulatory Visit: Payer: Medicare Other | Admitting: Hematology

## 2023-06-12 ENCOUNTER — Ambulatory Visit: Payer: Medicare Other

## 2023-06-12 ENCOUNTER — Other Ambulatory Visit: Payer: Medicare Other

## 2023-06-12 VITALS — BP 116/68 | HR 88 | Temp 98.9°F | Ht 62.0 in | Wt 173.5 lb

## 2023-06-12 DIAGNOSIS — M792 Neuralgia and neuritis, unspecified: Secondary | ICD-10-CM

## 2023-06-12 DIAGNOSIS — Z79899 Other long term (current) drug therapy: Secondary | ICD-10-CM

## 2023-06-12 DIAGNOSIS — R739 Hyperglycemia, unspecified: Secondary | ICD-10-CM

## 2023-06-12 LAB — HEMOGLOBIN A1C
Hgb A1c MFr Bld: 5.3 %{Hb} (ref <5.7)
Mean Plasma Glucose: 105 mg/dL
eAG (mmol/L): 5.8 mmol/L

## 2023-06-12 MED ORDER — PANTOPRAZOLE SODIUM 40 MG PO TBEC: DELAYED_RELEASE_TABLET | ORAL | 3 refills | Status: DC

## 2023-06-12 MED ORDER — GABAPENTIN 300 MG PO CAPS: ORAL_CAPSULE | ORAL | 3 refills | Status: DC

## 2023-06-12 MED ORDER — MECLIZINE HCL 25 MG PO TABS: 25.0000 mg | ORAL_TABLET | Freq: Three times a day (TID) | ORAL | 1 refills | Status: DC | PRN

## 2023-06-12 NOTE — Progress Notes (Signed)
Subjective:    Patient ID: Megan [name], female    DOB: 05-Jul-1961, 62 y.o.   MRN: [id number]  Patient has severe peripheral neuropathy.  She is on hemodialysis 3 times a week.  Based on her renal function she is already maximized the dose of gabapentin 300 mg at night.  She reports feeling dizzy on a daily basis.  Sometimes the room will spin what sounds like vertigo.  Sometimes she feels swimmy headed and lightheaded like it may be her blood pressure dropping.  She states that her blood pressure drops every day when she goes to dialysis.  This exacerbates her dizziness.  She recently had a stress test of her heart that showed no evidence of ischemia.  However she is taking Imdur twice daily due to chest pain.  Chest pain does not appear to be cardiac in nature.  It is more likely musculoskeletal.  The patient has no history of artery vasospasm. Past Medical History:  Diagnosis Date   (HFpEF) heart failure with preserved ejection fraction (HCC)    a. 01/2019 Echo: EF 55-60%, mild conc LVH. DD.  Torn MV chordae.   Anemia    Atypical chest pain    a. 08/2018 MV: EF 59%, no ischemia; b. 02/2019 Cath: nonobs dzs.   Blood transfusion without reported diagnosis    Breast cancer (HCC) 10/12/2020   Cataract    ESRD (end stage renal disease) on dialysis [facility])    a. HD T, T, S   Essential hypertension, benign    GERD (gastroesophageal reflux disease)    Headache    Hemorrhoids    Mixed hyperlipidemia    Morbid obesity (HCC)    Non-obstructive CAD (coronary artery disease)    a. 02/2019 CathL LM nl, LAD 44m, LCX nl, RCA 25p, EF 55-65%.   PONV (postoperative nausea and vomiting)    Renal insufficiency    S/P colonoscopy 08/2009   Dr. Elnoria Howard: sessile polyp (benign lymphoid), large hemorrhoids, repeat 5-10 years   Temporal arteritis (HCC)    Type 2 diabetes mellitus (HCC)    Wears glasses    Past Surgical History:  Procedure Laterality Date   ABDOMINAL HYSTERECTOMY     APPENDECTOMY      ARTERY BIOPSY N/A 05/09/2018   Procedure: RIGHT TEMPORAL ARTERY BIOPSY;  Surgeon: Jimmye Norman, MD;  Location: [facility] OR;  Service: General;  Laterality: N/A;   BREAST BIOPSY Right 06/15/2020   Procedure: RIGHT BREAST BIOPSY;  Surgeon: Franky Macho, MD;  Location: [facility];  Service: General;  Laterality: Right;   CATARACT EXTRACTION W/PHACO Left 02/09/2017   Procedure: CATARACT EXTRACTION PHACO AND INTRAOCULAR LENS PLACEMENT LEFT EYE;  Surgeon: Gemma Payor, MD;  Location: [facility];  Service: Ophthalmology;  Laterality: Left;  CDE: 4.89   CATARACT EXTRACTION W/PHACO Right 06/04/2017   Procedure: CATARACT EXTRACTION PHACO AND INTRAOCULAR LENS PLACEMENT (IOC);  Surgeon: Gemma Payor, MD;  Location: [facility];  Service: Ophthalmology;  Laterality: Right;  CDE: 4.12   CHOLECYSTECTOMY  09/29/2011   Procedure: LAPAROSCOPIC CHOLECYSTECTOMY;  Surgeon: Dalia Heading, MD;  Location: [facility];  Service: General;  Laterality: N/A;   COLONOSCOPY  08/2009   Dr. Elnoria Howard: sessile polyp (benign lymphoid), large hemorrhoids, repeat 5-10 years   COLONOSCOPY N/A 06/12/2016   prominent hemorrhoids   COLONOSCOPY WITH PROPOFOL N/A 06/16/2021   Procedure: COLONOSCOPY WITH PROPOFOL;  Surgeon: Corbin Ade, MD;  [facility];  Service: Endoscopy;  Laterality: N/A;  9:30am (dialysis pt)   ESOPHAGOGASTRODUODENOSCOPY  09/05/2011   [id number] hiatal hernia; remainder of exam normal. No explanation for patient's abdominal pain with today's examination   ESOPHAGOGASTRODUODENOSCOPY N/A 12/17/2013   Dr. Jena Gauss: gastric erythema, erosion, mild chronic inflammation on path    ESOPHAGOGASTRODUODENOSCOPY (EGD) WITH PROPOFOL N/A 06/16/2021   Procedure: ESOPHAGOGASTRODUODENOSCOPY (EGD) WITH PROPOFOL;  Surgeon: Corbin Ade, MD;  [facility];  Service: Endoscopy;  Laterality: N/A;   EXCISION OF BREAST BIOPSY Right 10/12/2020   Procedure: EXCISION OF RIGHT BREAST BIOPSY;  Surgeon: Franky Macho, MD;  Location: [facility];   Service: General;  Laterality: Right;   LAPAROSCOPIC APPENDECTOMY  09/29/2011   Procedure: APPENDECTOMY LAPAROSCOPIC;  Surgeon: Dalia Heading, MD;  Location: [facility];  Service: General;;  incidental appendectomy   LEFT HEART CATH AND CORONARY ANGIOGRAPHY N/A 02/28/2019   Procedure: LEFT HEART CATH AND CORONARY ANGIOGRAPHY;  Surgeon: Corky Crafts, MD;  Location: [facility] INVASIVE CV LAB;  Service: Cardiovascular;  Laterality: N/A;   MASS EXCISION Right 01/18/2023   Procedure: EXCISION MASS, RIGHT CHEST S/P MASTECTOMY;  Surgeon: Franky Macho, MD;  Location: [facility];  Service: General;  Laterality: Right;   MASTECTOMY MODIFIED RADICAL Right 02/18/2020   Procedure: MASTECTOMY MODIFIED RADICAL;  Surgeon: Franky Macho, MD;  Location: [facility];  Service: General;  Laterality: Right;   MASTECTOMY, PARTIAL Right 07/13/2020   Procedure: RIGHT PARTIAL MASTECTOMY;  Surgeon: Franky Macho, MD;  Location: [facility];  Service: General;  Laterality: Right;   PARTIAL MASTECTOMY WITH NEEDLE LOCALIZATION AND AXILLARY SENTINEL LYMPH NODE BX Right 09/18/2018   Procedure: RIGHT PARTIAL MASTECTOMY AFTER NEEDLE LOCALIZATION, SENTINEL LYMPH NODE BIOPSY RIGHT AXILLA;  Surgeon: Franky Macho, MD;  Location: [facility];  Service: General;  Laterality: Right;   POLYPECTOMY  06/16/2021   Procedure: POLYPECTOMY;  Surgeon: Corbin Ade, MD;  [facility];  Service: Endoscopy;;   PORTACATH PLACEMENT Right 06/07/2023   Procedure: INSERTION PORT-A-CATH, RIGHT  (DIALYSIS ACCESS ON LEFT);  Surgeon: Franky Macho, MD;  [facility]: [facility];  Service: General;  Laterality: Right;   SIMPLE MASTECTOMY WITH AXILLARY SENTINEL NODE BIOPSY Left 06/15/2020   Procedure: LEFT SIMPLE MASTECTOMY;  Surgeon: Franky Macho, MD;  [facility]: [facility];  Service: General;  Laterality: Left;   Current Outpatient Medications on File Prior to Visit  Medication Sig Dispense Refill   acetaminophen (TYLENOL) 500 MG tablet Take 500-1,000 mg by mouth every 6  (six) hours as needed (pain.).     amLODipine (NORVASC) 10 MG tablet TAKE 1 TABLET BY MOUTH AT BEDTIME 90 tablet 3   anastrozole (ARIMIDEX) 1 MG tablet TAKE 1 TABLET BY MOUTH DAILY 90 tablet 3   aspirin EC 81 MG tablet Take 81 mg by mouth in the morning.     atorvastatin (LIPITOR) 20 MG tablet TAKE 1 TABLET(20 MG) BY MOUTH DAILY 90 tablet 0   B Complex-C-Zn-Folic Acid (DIALYVITE 800-ZINC 15) 0.8 MG TABS Take 1 tablet by mouth in the morning.     cinacalcet (SENSIPAR) 30 MG tablet Take 2 tablets (60 mg total) by mouth daily. (Patient taking differently: Take 90 mg by mouth daily with breakfast. With a meal) 90 tablet 0   Darbepoetin Alfa (ARANESP, ALBUMIN FREE, IJ) every Monday, Wednesday, and Friday with hemodialysis.     fluticasone (FLONASE) 50 MCG/ACT nasal spray Place 2 sprays into both nostrils daily. (Patient taking differently: Place 2 sprays into both nostrils daily as needed for allergies or rhinitis.) 16 g 6   iron sucrose (VENOFER) 20 MG/ML injection Inject  50 mg into the vein once a week.     isosorbide mononitrate (IMDUR) 30 MG 24 hr tablet Take 1 tablet (30 mg total) by mouth 2 (two) times daily. 180 tablet 3   lanthanum (FOSRENOL) 1000 MG chewable tablet Chew 2,000-3,000 mg by mouth See admin instructions. Take 3 tablets (3000 mg) by mouth with meals and take 2 tablets (2000 mg) with snacks     lidocaine-prilocaine (EMLA) cream Apply 1 application  topically every Monday, Wednesday, and Friday with hemodialysis.     nebivolol (BYSTOLIC) 10 MG tablet TAKE 1 TABLET BY MOUTH IN THE MORNING AND AT BEDTIME 180 tablet 0   nitroGLYCERIN (NITROSTAT) 0.4 MG SL tablet Place 1 tablet (0.4 mg total) under the tongue every 5 (five) minutes x 3 doses as needed for chest pain (if no relief after 3rd dose, proceed to ED or call 911). 25 tablet 3   ondansetron (ZOFRAN) 4 MG tablet Take 1 tablet (4 mg total) by mouth every 8 (eight) hours as needed for nausea or vomiting. 10 tablet 0   ondansetron  (ZOFRAN-ODT) 4 MG disintegrating tablet Take 1 tablet (4 mg total) by mouth every 8 (eight) hours as needed for nausea or vomiting. 12 tablet 0   oxyCODONE (ROXICODONE) 5 MG immediate release tablet Take 1 tablet (5 mg total) by mouth every 6 (six) hours as needed. 30 tablet 0   sucralfate (CARAFATE) 1 GM/10ML suspension Take 10 mLs (1 g total) by mouth with breakfast, with lunch, and with evening meal. 420 mL 2   VITAMIN D PO Take 0.25 mcg by mouth daily.     No current facility-administered medications on file prior to visit.   No Known Allergies Social History   Socioeconomic History   Marital status: Married    Spouse name: Not on file   Number of children: Not on file   Years of education: Not on file   Highest education level: 12th grade  Occupational History   Occupation: Systems developer: [employer]  Tobacco Use   Smoking status: Never    Passive exposure: Never   Smokeless tobacco: Never  Vaping Use   Vaping status: Never Used  Substance and Sexual Activity   Alcohol use: No   Drug use: No   Sexual activity: Yes    Birth control/protection: Surgical  Other Topics Concern   Not on file  Social History Narrative   Works at [employer] in [place].    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   Also has to get items from high shelves-causes achy pain in shoulder area      Married.   Children are grown, out of house.   Social Determinants of Health   Financial Resource Strain: Low Risk  (06/08/2023)   Overall Financial Resource Strain (CARDIA)    Difficulty of Paying Living Expenses: Not hard at all  Food Insecurity: No Food Insecurity (06/08/2023)   Hunger Vital Sign    Worried About Running Out of Food in the Last Year: Never true    Ran Out of Food in the Last Year: Never true  Transportation Needs: No Transportation Needs (06/08/2023)   PRAPARE - Administrator, Civil Service (Medical): No    Lack of Transportation  (Non-Medical): No  Physical Activity: Inactive (06/08/2023)   Exercise Vital Sign    Days of Exercise per Week: 0 days    Minutes of Exercise per Session:  0 min  Stress: No Stress Concern Present (06/08/2023)   [employer] of Occupational Health - Occupational Stress Questionnaire    Feeling of Stress : Not at all  Social Connections: Socially Integrated (06/08/2023)   Social Connection and Isolation Panel [NHANES]    Frequency of Communication with Friends and Family: More than three times a week    Frequency of Social Gatherings with Friends and Family: Once a week    Attends Religious Services: More than 4 times per year    Active Member of [employer] or Organizations: No    Attends Engineer, structural: More than 4 times per year    Marital Status: Married  Catering manager Violence: Not At Risk (09/14/2022)   Humiliation, Afraid, Rape, and Kick questionnaire    Fear of Current or Ex-Partner: No    Emotionally Abused: No    Physically Abused: No    Sexually Abused: No    Past Medical History:  Diagnosis Date   (HFpEF) heart failure with preserved ejection fraction (HCC)    a. 01/2019 Echo: EF 55-60%, mild conc LVH. DD.  Torn MV chordae.   Anemia    Atypical chest pain    a. 08/2018 MV: EF 59%, no ischemia; b. 02/2019 Cath: nonobs dzs.   Blood transfusion without reported diagnosis    Breast cancer (HCC) 10/12/2020   Cataract    ESRD (end stage renal disease) on dialysis [facility])    a. HD T, T, S   Essential hypertension, benign    GERD (gastroesophageal reflux disease)    Headache    Hemorrhoids    Mixed hyperlipidemia    Morbid obesity (HCC)    Non-obstructive CAD (coronary artery disease)    a. 02/2019 CathL LM nl, LAD 3m, LCX nl, RCA 25p, EF 55-65%.   PONV (postoperative nausea and vomiting)    Renal insufficiency    S/P colonoscopy 08/2009   Dr. Elnoria Howard: sessile polyp (benign lymphoid), large hemorrhoids, repeat 5-10 years   Temporal arteritis (HCC)    Type 2  diabetes mellitus (HCC)    Wears glasses    Past Surgical History:  Procedure Laterality Date   ABDOMINAL HYSTERECTOMY     APPENDECTOMY     ARTERY BIOPSY N/A 05/09/2018   Procedure: RIGHT TEMPORAL ARTERY BIOPSY;  Surgeon: Jimmye Norman, MD;  Location: [facility] OR;  Service: General;  Laterality: N/A;   BREAST BIOPSY Right 06/15/2020   Procedure: RIGHT BREAST BIOPSY;  Surgeon: Franky Macho, MD;  [facility]: [facility];  Service: General;  Laterality: Right;   CATARACT EXTRACTION W/PHACO Left 02/09/2017   Procedure: CATARACT EXTRACTION PHACO AND INTRAOCULAR LENS PLACEMENT LEFT EYE;  Surgeon: Gemma Payor, MD;  Location: [facility];  Service: Ophthalmology;  Laterality: Left;  CDE: 4.89   CATARACT EXTRACTION W/PHACO Right 06/04/2017   Procedure: CATARACT EXTRACTION PHACO AND INTRAOCULAR LENS PLACEMENT (IOC);  Surgeon: Gemma Payor, MD;  Location: [facility];  Service: Ophthalmology;  Laterality: Right;  CDE: 4.12   CHOLECYSTECTOMY  09/29/2011   Procedure: LAPAROSCOPIC CHOLECYSTECTOMY;  Surgeon: Dalia Heading, MD;  Location: [facility];  Service: General;  Laterality: N/A;   COLONOSCOPY  08/2009   Dr. Elnoria Howard: sessile polyp (benign lymphoid), large hemorrhoids, repeat 5-10 years   COLONOSCOPY N/A 06/12/2016   prominent hemorrhoids   COLONOSCOPY WITH PROPOFOL N/A 06/16/2021   Procedure: COLONOSCOPY WITH PROPOFOL;  Surgeon: Corbin Ade, MD;  [facility];  Service: Endoscopy;  Laterality: N/A;  9:30am (dialysis pt)   ESOPHAGOGASTRODUODENOSCOPY  09/05/2011  [id number] hiatal hernia; remainder of exam normal. No explanation for patient's abdominal pain with today's examination   ESOPHAGOGASTRODUODENOSCOPY N/A 12/17/2013   Dr. Jena Gauss: gastric erythema, erosion, mild chronic inflammation on path    ESOPHAGOGASTRODUODENOSCOPY (EGD) WITH PROPOFOL N/A 06/16/2021   Procedure: ESOPHAGOGASTRODUODENOSCOPY (EGD) WITH PROPOFOL;  Surgeon: Corbin Ade, MD;  [facility];  Service: Endoscopy;  Laterality: N/A;    EXCISION OF BREAST BIOPSY Right 10/12/2020   Procedure: EXCISION OF RIGHT BREAST BIOPSY;  Surgeon: Franky Macho, MD;  Location: [facility];  Service: General;  Laterality: Right;   LAPAROSCOPIC APPENDECTOMY  09/29/2011   Procedure: APPENDECTOMY LAPAROSCOPIC;  Surgeon: Dalia Heading, MD;  Location: [facility];  Service: General;;  incidental appendectomy   LEFT HEART CATH AND CORONARY ANGIOGRAPHY N/A 02/28/2019   Procedure: LEFT HEART CATH AND CORONARY ANGIOGRAPHY;  Surgeon: Corky Crafts, MD;  Location: [facility] INVASIVE CV LAB;  Service: Cardiovascular;  Laterality: N/A;   MASS EXCISION Right 01/18/2023   Procedure: EXCISION MASS, RIGHT CHEST S/P MASTECTOMY;  Surgeon: Franky Macho, MD;  Location: [facility];  Service: General;  Laterality: Right;   MASTECTOMY MODIFIED RADICAL Right 02/18/2020   Procedure: MASTECTOMY MODIFIED RADICAL;  Surgeon: Franky Macho, MD;  Location: [facility];  Service: General;  Laterality: Right;   MASTECTOMY, PARTIAL Right 07/13/2020   Procedure: RIGHT PARTIAL MASTECTOMY;  Surgeon: Franky Macho, MD;  Location: [facility];  Service: General;  Laterality: Right;   PARTIAL MASTECTOMY WITH NEEDLE LOCALIZATION AND AXILLARY SENTINEL LYMPH NODE BX Right 09/18/2018   Procedure: RIGHT PARTIAL MASTECTOMY AFTER NEEDLE LOCALIZATION, SENTINEL LYMPH NODE BIOPSY RIGHT AXILLA;  Surgeon: Franky Macho, MD;  Location: [facility];  Service: General;  Laterality: Right;   POLYPECTOMY  06/16/2021   Procedure: POLYPECTOMY;  Surgeon: Corbin Ade, MD;  [facility];  Service: Endoscopy;;   PORTACATH PLACEMENT Right 06/07/2023   Procedure: INSERTION PORT-A-CATH, RIGHT  (DIALYSIS ACCESS ON LEFT);  Surgeon: Franky Macho, MD;  [facility]: [facility];  Service: General;  Laterality: Right;   SIMPLE MASTECTOMY WITH AXILLARY SENTINEL NODE BIOPSY Left 06/15/2020   Procedure: LEFT SIMPLE MASTECTOMY;  Surgeon: Franky Macho, MD;  Location: [facility];  Service: General;  Laterality: Left;   Current Outpatient  Medications on File Prior to Visit  Medication Sig Dispense Refill   acetaminophen (TYLENOL) 500 MG tablet Take 500-1,000 mg by mouth every 6 (six) hours as needed (pain.).     amLODipine (NORVASC) 10 MG tablet TAKE 1 TABLET BY MOUTH AT BEDTIME 90 tablet 3   anastrozole (ARIMIDEX) 1 MG tablet TAKE 1 TABLET BY MOUTH DAILY 90 tablet 3   aspirin EC 81 MG tablet Take 81 mg by mouth in the morning.     atorvastatin (LIPITOR) 20 MG tablet TAKE 1 TABLET(20 MG) BY MOUTH DAILY 90 tablet 0   B Complex-C-Zn-Folic Acid (DIALYVITE 800-ZINC 15) 0.8 MG TABS Take 1 tablet by mouth in the morning.     cinacalcet (SENSIPAR) 30 MG tablet Take 2 tablets (60 mg total) by mouth daily. (Patient taking differently: Take 90 mg by mouth daily with breakfast. With a meal) 90 tablet 0   Darbepoetin Alfa (ARANESP, ALBUMIN FREE, IJ) every Monday, Wednesday, and Friday with hemodialysis.     fluticasone (FLONASE) 50 MCG/ACT nasal spray Place 2 sprays into both nostrils daily. (Patient taking differently: Place 2 sprays into both nostrils daily as needed for allergies or rhinitis.) 16 g 6   iron sucrose (VENOFER) 20 MG/ML injection Inject 50 mg into  the vein once a week.     isosorbide mononitrate (IMDUR) 30 MG 24 hr tablet Take 1 tablet (30 mg total) by mouth 2 (two) times daily. 180 tablet 3   lanthanum (FOSRENOL) 1000 MG chewable tablet Chew 2,000-3,000 mg by mouth See admin instructions. Take 3 tablets (3000 mg) by mouth with meals and take 2 tablets (2000 mg) with snacks     lidocaine-prilocaine (EMLA) cream Apply 1 application  topically every Monday, Wednesday, and Friday with hemodialysis.     nebivolol (BYSTOLIC) 10 MG tablet TAKE 1 TABLET BY MOUTH IN THE MORNING AND AT BEDTIME 180 tablet 0   nitroGLYCERIN (NITROSTAT) 0.4 MG SL tablet Place 1 tablet (0.4 mg total) under the tongue every 5 (five) minutes x 3 doses as needed for chest pain (if no relief after 3rd dose, proceed to ED or call 911). 25 tablet 3   ondansetron  (ZOFRAN) 4 MG tablet Take 1 tablet (4 mg total) by mouth every 8 (eight) hours as needed for nausea or vomiting. 10 tablet 0   ondansetron (ZOFRAN-ODT) 4 MG disintegrating tablet Take 1 tablet (4 mg total) by mouth every 8 (eight) hours as needed for nausea or vomiting. 12 tablet 0   oxyCODONE (ROXICODONE) 5 MG immediate release tablet Take 1 tablet (5 mg total) by mouth every 6 (six) hours as needed. 30 tablet 0   sucralfate (CARAFATE) 1 GM/10ML suspension Take 10 mLs (1 g total) by mouth with breakfast, with lunch, and with evening meal. 420 mL 2   VITAMIN D PO Take 0.25 mcg by mouth daily.     No current facility-administered medications on file prior to visit.   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   Also has to get items from high shelves-causes achy pain in shoulder area      Married.   Children are grown, out of house.   Social Determinants of Health   Financial Resource Strain: Low Risk  (06/08/2023)   Overall Financial Resource Strain (CARDIA)    Difficulty of Paying Living Expenses: Not hard at all  Food Insecurity: No Food Insecurity (06/08/2023)   Hunger Vital Sign    Worried About Running Out of Food in the Last Year: Never true    Ran Out of Food in the Last Year: Never true  Transportation Needs: No  Transportation Needs (06/08/2023)   PRAPARE - Administrator, Civil Service (Medical): No    Lack of Transportation (Non-Medical): No  Physical Activity: Inactive (06/08/2023)   Exercise Vital Sign    Days of Exercise per Week: 0 days    Minutes of Exercise per Session: 0 min  Stress: No Stress Concern Present (06/08/2023)   [employer] of Occupational Health - Occupational Stress Questionnaire    Feeling of Stress : Not at all  Social Connections: Socially Integrated (06/08/2023)   Social Connection and Isolation Panel [NHANES]    Frequency of Communication with Friends and Family: More than three times a week    Frequency of Social Gatherings with Friends and Family: Once a week    Attends Religious Services: More than 4 times per year    Active Member of [employer] or Organizations: No    Attends Engineer, structural: More than 4 times per year    Marital Status: Married  Catering manager Violence: Not At Risk (09/14/2022)   Humiliation, Afraid, Rape, and Kick questionnaire    Fear of Current or Ex-Partner: No    Emotionally Abused: No    Physically Abused: No    Sexually Abused: No      Review of Systems  All other systems reviewed and are negative.      Objective:   Physical Exam Vitals reviewed.  Constitutional:      General: She is not in acute distress.    Appearance: Normal appearance. She is normal weight.  Cardiovascular:     Rate and Rhythm: Normal rate and regular rhythm.     Pulses: Normal pulses.     Heart sounds: Murmur (thrill from AV fistual heard as murmur) heard.  Pulmonary:     Effort: Pulmonary effort is normal. No respiratory distress.     Breath sounds: Normal breath sounds. No stridor. No wheezing, rhonchi or rales.  Chest:     Chest wall: No tenderness.  Musculoskeletal:     Right lower leg: No edema.     Left lower leg: No edema.     Right foot: Normal range of motion. No deformity.  Feet:     Right foot:     Skin  integrity: No ulcer, blister, skin breakdown, erythema, warmth or callus.     Left foot:     Skin integrity: No ulcer, blister, skin breakdown, erythema, warmth or callus.  Neurological:     Mental Status: She is alert.           Assessment & Plan:  Elevated blood sugar - Plan: Hemoglobin A1c  Neuropathic pain  Medication management I do not think her chest pain is cardiac in nature.  I am concerned that hypotension may be exacerbating this.  Therefore I recommended that she wean down on Imdur.  Decrease to once a day.  If in 1 week, chest pain has not worsened we may discontinue this medication altogether especially if the dizziness improves.  Continue to use gabapentin 300 mg at night.  Recent lab work has shown elevated blood sugars.  She is overdue to recheck hemoglobin A1c.  She has a history of diabetes mellitus however her hemoglobin A1c 2022 is normal.  I like to recheck this today.  Patient has already had a flu shot.  I recommended her COVID shot

## 2023-06-13 ENCOUNTER — Encounter (HOSPITAL_COMMUNITY): Payer: Self-pay | Admitting: Hematology

## 2023-06-13 DIAGNOSIS — Z992 Dependence on renal dialysis: Secondary | ICD-10-CM | POA: Diagnosis not present

## 2023-06-13 DIAGNOSIS — N186 End stage renal disease: Secondary | ICD-10-CM | POA: Diagnosis not present

## 2023-06-13 DIAGNOSIS — E119 Type 2 diabetes mellitus without complications: Secondary | ICD-10-CM | POA: Diagnosis not present

## 2023-06-13 DIAGNOSIS — N2581 Secondary hyperparathyroidism of renal origin: Secondary | ICD-10-CM | POA: Diagnosis not present

## 2023-06-13 DIAGNOSIS — E1129 Type 2 diabetes mellitus with other diabetic kidney complication: Secondary | ICD-10-CM | POA: Diagnosis not present

## 2023-06-13 DIAGNOSIS — D631 Anemia in chronic kidney disease: Secondary | ICD-10-CM | POA: Diagnosis not present

## 2023-06-13 NOTE — Progress Notes (Signed)
[facility] [address], [place] [address]    Clinic Day:  06/14/23   Referring physician: Donita Brooks, MD  Patient Care Team: Donita Brooks, MD as PCP - General (Family Medicine) [name] [name], MD as PCP - Cardiology (Cardiology) [name], MD (Gastroenterology) [name], MD as Consulting Physician (Cardiology) [name], MD as Medical Oncologist (Medical Oncology) [name], MD as Consulting Physician (Nephrology)   ASSESSMENT & PLAN:   Assessment: 1.  Stage Ib (T1CN0) right breast cancer, ER positive, PR and HER-2 negative: -Right lumpectomy and sentinel lymph node biopsy on 09/18/2018 shows 1.5 cm IDC, grade 3, associated with high-grade DCIS, negative margins, 0/3 lymph nodes positive, ER 50%, PR negative and HER-2 negative, Ki-67 60%. -Oncotype DX recurrence score 47.  Distant recurrence at 9 years with tamoxifen alone is 36%.  Absolute chemotherapy benefit is more than 15%. -Adjuvant chemotherapy with 4 cycles of AC from 10/30/2018 through 01/01/2019. -She was started on anastrozole in June 2020. -Mammogram on 09/02/2019 showed calcifications in the posterior aspect of upper outer quadrant of the right breast. -Right partial mastectomy on 02/18/2020 shows high-grade DCIS, 1.5 cm, no invasive carcinoma, resection margins negative.  0/11 lymph nodes.  ER 30% positive, PR negative. -Left simple mastectomy on 06/15/2020 was benign.  Right breast biopsy shows microscopic focus of invasive ductal carcinoma in the background of extensive high-grade DCIS. -Right breast lumpectomy on 07/13/2020 shows multifocal invasive ductal carcinoma, grade 3, largest measuring 1.2 cm.  Extensive high-grade DCIS with necrosis.  Margins are free.  PT1CNX.  There are multiple foci of invasive carcinoma arising from extensive DCIS.  Several small foci of invasive tumor arising from DCIS indicating new primaries.  ER/PR/HER-2 is negative.   Ki-67 is 20%. -PET scan on 09/14/2020 shows areas of nodularity with discrete nodule in the fat of the inferior chest wall within the subcutaneous tissues. -Ultrasound of the right chest wall shows masslike abnormality in the upper inner aspect of the right anterior chest at 1 o'clock position measuring 4.1 cm in greatest dimension.  1.7 cm hypoechoic mass in the central anterior right chest concerning for malignancy.  Possible borderline enlarged residual right axillary lymph node. -Right breast biopsy on 10/12/2020 shows 1.3 cm invasive ductal carcinoma, focally 0.1 cm from superior margin.  DCIS is less than 0.1 cm from superior margin.  PT1CPNX. - Right mastectomy followed by radiation therapy completed in May 2022. - PET scan (11/30/2022): Recurrent/metastatic disease within the right medial pectoralis musculature. - Biopsy (12/19/2022): Metastatic breast adenocarcinoma.  ER positive, 40%, weak staining.  PR negative.  Ki-67 30%.  HER2 IHC 0. - Right chest wall mass resection on 01/18/2023.  Pathology shows 1.7 cm IDC, grade 3.  DCIS with high-grade with necrosis.  Carcinoma involves inked inferior margin with focally invading into atrophic skeletal muscle. - Her case was discussed at multidisciplinary clinic at [facility] on 04/18/2023. - Surgical oncology felt that resection margins are adequate and recommended radiation therapy.  She was also evaluated by medical oncology (Dr. Iona Hansen) who has recommended systemic therapy with 6 cycles of docetaxel, carboplatin and pembrolizumab. - XRT from 05/07/2023 through 05/25/2023.  2.  Osteopenia: -Bone density on 01/20/2019 shows T score -1.9. - DEXA scan (03/24/2021) T score -2.4    Plan: 1.  Recurrent right breast cancer, ER 40%, weak staining, PR negative, HER2 negative: - She has mild fatigue from recent radiation therapy. - We reviewed labs today: Normal LFTs.  CBC grossly normal with hemoglobin 9.9. - We talked about initiating her on chemotherapy as  recommended by [facility] breast cancer group.  We talked about carboplatin, docetaxel and pembrolizumab cycled every 3 weeks for 6 times.  We discussed side effects in detail. - We will dose reduce docetaxel for her neuropathy.  She will proceed with her cycle 1 today.  She will be reevaluated in 2 weeks for toxicity assessment.   2.  ESRD on HD: - Continue hemodialysis on Monday, Wednesday and Friday under the direction of Dr. Arrie Aran.   3.  Neuropathy: - Continue gabapentin at bedtime.  Well-controlled.   4.  Right chest wall pain: - Continue oxycodone 5 mg daily as needed.     Orders Placed This Encounter  Procedures   TSH    Order Specific Question:   Remote health to draw?    Answer:   No     I,[name],acting as a scribe for [name], MD.,have documented all relevant documentation on the behalf of [name], MD,as directed by  [name], MD while in the presence of [name], MD.  I, [name] MD, have reviewed the above documentation for accuracy and completeness, and I agree with the above.     [name], MD   10/31/20245:46 PM  CHIEF COMPLAINT:   Diagnosis: recurrent right breast cancer    Cancer Staging  Malignant neoplasm of upper-inner quadrant of right female breast [facility]) Staging form: Breast, AJCC 8th Edition - Clinical stage from 09/26/2018: Stage IB (cT1c, cN0, cM0, G3, ER+, PR-, HER2-) - Signed by [name], MD on 09/26/2018    Prior Therapy: 1. Right lumpectomy and sentinel lymph node biopsy on 09/18/2018  2. Adjuvant chemotherapy with 4 cycles of AC from 10/30/2018 through 01/01/2019  3. Right partial mastectomy on 02/18/2020  4. Right breast lumpectomy on 07/13/2020  5. Right mastectomy followed by radiation therapy completed in May 2022 6.  Anastrozole started in June 2020  Current Therapy: Carboplatin, docetaxel, pembrolizumab   HISTORY OF PRESENT ILLNESS:   Oncology History   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)  09/18/2018 Initial Diagnosis   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)   09/26/2018 Cancer Staging   Staging form: Breast, AJCC 8th Edition - Clinical stage from 09/26/2018: Stage IB (cT1c, cN0, cM0, G3, ER+, PR-, HER2-) - Signed by [name], MD on 09/26/2018   10/30/2018 - 01/01/2019 Chemotherapy   Patient is on Treatment Plan : BREAST Adjuvant AC q21d     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 female presenting to clinic today for follow up of recurrent right breast cancer. She was last seen by me on 05/15/23.  Since her last visit, she had her port inserted on 06/07/23. She underwent an initial PET on 05/31/23 that found: marked decrease in metabolic activity localizing to the RIGHT pectoralis muscle; residual metabolic activity may represent treatment effect or residual carcinoma; no additional evidence of metastatic breast carcinoma; and metabolic activity in the distal stomach is favored inflammatory.   Today, she states that she is doing well overall. Her appetite level is at 75%. Her energy level  is at 75%. She is accompanied by her husband.   She reports pain in the right axillary, right shoulder and neck that she attributes to XRT. Pain began immediately following XRT that resolved and reappeared 1 week after XRT. She has taken oxycodone and Tylenol in the past from XRT pain, though she has not taken any pain medication for this recurrent pain. She denies any autoimmune diseases.   PAST MEDICAL HISTORY:   Past Medical History: Past Medical History:  Diagnosis Date   (HFpEF) heart failure with preserved  ejection fraction (HCC)    a. 01/2019 Echo: EF 55-60%, mild conc LVH. DD.  Torn MV chordae.   Anemia    Atypical chest pain    a. 08/2018 MV: EF 59%, no ischemia; b. 02/2019 Cath: nonobs dzs.   Blood transfusion without reported diagnosis    Breast cancer (HCC) 10/12/2020   Cataract    ESRD (end stage renal disease) on dialysis [facility])    a. HD T, T, S   Essential hypertension, benign    GERD (gastroesophageal reflux disease)    Headache    Hemorrhoids    Mixed hyperlipidemia    Morbid obesity (HCC)    Non-obstructive CAD (coronary artery disease)    a. 02/2019 CathL LM nl, LAD 23m, LCX nl, RCA 25p, EF 55-65%.   PONV (postoperative nausea and vomiting)    Renal insufficiency    S/P colonoscopy 08/2009   Dr. Elnoria Howard: sessile polyp (benign lymphoid), large hemorrhoids, repeat 5-10 years   Temporal arteritis (HCC)    Type 2 diabetes mellitus (HCC)    Wears glasses     Surgical History: Past Surgical History:  Procedure Laterality Date   ABDOMINAL HYSTERECTOMY     APPENDECTOMY     ARTERY BIOPSY N/A 05/09/2018   Procedure: RIGHT TEMPORAL ARTERY BIOPSY;  Surgeon: Jimmye Norman, MD;  Location: [facility] OR;  Service: General;  Laterality: N/A;   BREAST BIOPSY Right 06/15/2020   Procedure: RIGHT BREAST BIOPSY;  Surgeon: Franky Macho, MD;  Location: [facility] ORS;  Service: General;  Laterality: Right;   CATARACT EXTRACTION W/PHACO Left 02/09/2017   Procedure: CATARACT EXTRACTION PHACO AND INTRAOCULAR LENS PLACEMENT LEFT EYE;  Surgeon: Gemma Payor, MD;  Location: [facility] OR[facility];  Service: Ophthalmology;  Laterality: Left;  CDE: 4.89   CATARACT EXTRACTION W/PHACO Right 06/04/2017   Procedure: CATARACT EXTRACTION PHACO AND INTRAOCULAR LENS PLACEMENT (IOC);  Surgeon: Gemma Payor, MD;  Location: [facility] ORS;  Service: Ophthalmology;  Laterality: Right;  CDE: 4.12   CHOLECYSTECTOMY  09/29/2011   Procedure: LAPAROSCOPIC CHOLECYSTECTOMY;  Surgeon: Dalia Heading, MD;  Location: [facility] ORS;  Service: General;  Laterality: N/A;    COLONOSCOPY  08/2009   Dr. Elnoria Howard: sessile polyp (benign lymphoid), large hemorrhoids, repeat 5-10 years   COLONOSCOPY N/A 06/12/2016   prominent hemorrhoids   COLONOSCOPY WITH PROPOFOL N/A 06/16/2021   Procedure: COLONOSCOPY WITH PROPOFOL;  Surgeon: Corbin Ade, MD;  Location: [facility];  Service: Endoscopy;  Laterality: N/A;  9:30am (dialysis pt)   ESOPHAGOGASTRODUODENOSCOPY  09/05/2011   [id number] hiatal hernia; remainder of exam normal. No explanation for patient's abdominal pain with today's examination   ESOPHAGOGASTRODUODENOSCOPY N/A 12/17/2013   Dr. Jena Gauss: gastric erythema, erosion, mild chronic inflammation on path    ESOPHAGOGASTRODUODENOSCOPY (EGD) WITH PROPOFOL N/A 06/16/2021   Procedure: ESOPHAGOGASTRODUODENOSCOPY (EGD) WITH PROPOFOL;  Surgeon: Corbin Ade, MD;  Location: [facility];  Service: Endoscopy;  Laterality: N/A;   EXCISION OF BREAST BIOPSY Right 10/12/2020   Procedure:  EXCISION OF RIGHT BREAST BIOPSY;  Surgeon: Franky Macho, MD;  Location: [facility];  Service: General;  Laterality: Right;   LAPAROSCOPIC APPENDECTOMY  09/29/2011   Procedure: APPENDECTOMY LAPAROSCOPIC;  Surgeon: Dalia Heading, MD;  Location: [facility];  Service: General;;  incidental appendectomy   LEFT HEART CATH AND CORONARY ANGIOGRAPHY N/A 02/28/2019   Procedure: LEFT HEART CATH AND CORONARY ANGIOGRAPHY;  Surgeon: Corky Crafts, MD;  Location: [facility] INVASIVE CV LAB;  Service: Cardiovascular;  Laterality: N/A;   MASS EXCISION Right 01/18/2023   Procedure: EXCISION MASS, RIGHT CHEST S/P MASTECTOMY;  Surgeon: Franky Macho, MD;  Location: [facility];  Service: General;  Laterality: Right;   MASTECTOMY MODIFIED RADICAL Right 02/18/2020   Procedure: MASTECTOMY MODIFIED RADICAL;  Surgeon: Franky Macho, MD;  Location: [facility];  Service: General;  Laterality: Right;   MASTECTOMY, PARTIAL Right 07/13/2020   Procedure: RIGHT PARTIAL MASTECTOMY;  Surgeon: Franky Macho, MD;  Location: [facility];  Service:  General;  Laterality: Right;   PARTIAL MASTECTOMY WITH NEEDLE LOCALIZATION AND AXILLARY SENTINEL LYMPH NODE BX Right 09/18/2018   Procedure: RIGHT PARTIAL MASTECTOMY AFTER NEEDLE LOCALIZATION, SENTINEL LYMPH NODE BIOPSY RIGHT AXILLA;  Surgeon: Franky Macho, MD;  Location: [facility];  Service: General;  Laterality: Right;   POLYPECTOMY  06/16/2021   Procedure: POLYPECTOMY;  Surgeon: Corbin Ade, MD;  Location: [facility];  Service: Endoscopy;;   PORTACATH PLACEMENT Right 06/07/2023   Procedure: INSERTION PORT-A-CATH, RIGHT  (DIALYSIS ACCESS ON LEFT);  Surgeon: Franky Macho, MD;  Location: [facility];  Service: General;  Laterality: Right;   SIMPLE MASTECTOMY WITH AXILLARY SENTINEL NODE BIOPSY Left 06/15/2020   Procedure: LEFT SIMPLE MASTECTOMY;  Surgeon: Franky Macho, MD;  Location: [facility];  Service: General;  Laterality: Left;    Social History: Social History   Socioeconomic History   Marital status: Married    Spouse name: Not on file   Number of children: Not on file   Years of education: Not on file   Highest education level: 12th grade  Occupational History   Occupation: Systems developer: [employer]  Tobacco Use   Smoking status: Never    Passive exposure: Never   Smokeless tobacco: Never  Vaping Use   Vaping status: Never Used  Substance and Sexual Activity   Alcohol use: No   Drug use: No   Sexual activity: Yes    Birth control/protection: Surgical  Other Topics Concern   Not on file  Social History Narrative   Works at [employer] in [place].    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   Also has to get items from high shelves-causes achy pain in shoulder area      Married.   Children are grown, out of house.   Social Determinants of Health   Financial Resource Strain: Low Risk  (06/08/2023)   Overall Financial Resource Strain (CARDIA)    Difficulty of Paying Living Expenses: Not hard at all  Food Insecurity: No Food Insecurity  (06/08/2023)   Hunger Vital Sign    Worried About Running Out of Food in the Last Year: Never true    Ran Out of Food in the Last Year: Never true  Transportation Needs: No Transportation Needs (06/08/2023)   PRAPARE - Administrator, Civil Service (Medical): No    Lack of Transportation (Non-Medical): No  Physical Activity: Inactive (06/08/2023)   Exercise Vital Sign    Days  of Exercise per Week: 0 days    Minutes of Exercise per Session: 0 min  Stress: No Stress Concern Present (06/08/2023)   [employer] of Occupational Health - Occupational Stress Questionnaire    Feeling of Stress : Not at all  Social Connections: Socially Integrated (06/08/2023)   Social Connection and Isolation Panel [NHANES]    Frequency of Communication with Friends and Family: More than three times a week    Frequency of Social Gatherings with Friends and Family: Once a week    Attends Religious Services: More than 4 times per year    Active Member of [employer] or Organizations: No    Attends Engineer, structural: More than 4 times per year    Marital Status: Married  Catering manager Violence: Not At Risk (09/14/2022)   Humiliation, Afraid, Rape, and Kick questionnaire    Fear of Current or Ex-Partner: No    Emotionally Abused: No    Physically Abused: No    Sexually Abused: No    Family History: Family History  Problem Relation Age of Onset   Hypertension Mother    Coronary artery disease Mother    Diabetes Mother    Hypertension Sister    Coronary artery disease Sister    Hypertension Brother    Heart attack Father    Hypertension Son    Heart attack Maternal Aunt    Hypertension Maternal Aunt    Diabetes Maternal Aunt    Heart attack Maternal Uncle    Hypertension Maternal Uncle    Diabetes Maternal Uncle    Heart attack Paternal Aunt    Hypertension Paternal Aunt    Diabetes Paternal Aunt    Heart attack Paternal Uncle    Hypertension Paternal Uncle    Diabetes  Paternal Uncle    Heart attack Maternal Grandmother    Heart attack Maternal Grandfather    Heart attack Paternal Grandmother    Heart attack Paternal Grandfather    Colon cancer Neg Hx     Current Medications:  Current Outpatient Medications:    acetaminophen (TYLENOL) 500 MG tablet, Take 500-1,000 mg by mouth every 6 (six) hours as needed (pain.)., Disp: , Rfl:    amLODipine (NORVASC) 10 MG tablet, TAKE 1 TABLET BY MOUTH AT BEDTIME, Disp: 90 tablet, Rfl: 3   anastrozole (ARIMIDEX) 1 MG tablet, TAKE 1 TABLET BY MOUTH DAILY, Disp: 90 tablet, Rfl: 3   aspirin EC 81 MG tablet, Take 81 mg by mouth in the morning., Disp: , Rfl:    atorvastatin (LIPITOR) 20 MG tablet, TAKE 1 TABLET(20 MG) BY MOUTH DAILY, Disp: 90 tablet, Rfl: 0   B Complex-C-Zn-Folic Acid (DIALYVITE 800-ZINC 15) 0.8 MG TABS, Take 1 tablet by mouth in the morning., Disp: , Rfl:    cinacalcet (SENSIPAR) 30 MG tablet, Take 2 tablets (60 mg total) by mouth daily. (Patient taking differently: Take 90 mg by mouth daily with breakfast. With a meal), Disp: 90 tablet, Rfl: 0   Darbepoetin Alfa (ARANESP, ALBUMIN FREE, IJ), every Monday, Wednesday, and Friday with hemodialysis., Disp: , Rfl:    fluticasone (FLONASE) 50 MCG/ACT nasal spray, Place 2 sprays into both nostrils daily. (Patient taking differently: Place 2 sprays into both nostrils daily as needed for allergies or rhinitis.), Disp: 16 g, Rfl: 6   gabapentin (NEURONTIN) 300 MG capsule, TAKE 1 CAPSULE(300 MG) BY MOUTH AT BEDTIME, Disp: 90 capsule, Rfl: 3   iron sucrose (VENOFER) 20 MG/ML injection, Inject 50 mg into the vein once a  week., Disp: , Rfl:    isosorbide mononitrate (IMDUR) 30 MG 24 hr tablet, Take 1 tablet (30 mg total) by mouth 2 (two) times daily. (Patient taking differently: Take 30 mg by mouth daily.), Disp: 180 tablet, Rfl: 3   lanthanum (FOSRENOL) 1000 MG chewable tablet, Chew 2,000-3,000 mg by mouth See admin instructions. Take 3 tablets (3000 mg) by mouth with  meals and take 2 tablets (2000 mg) with snacks, Disp: , Rfl:    lidocaine-prilocaine (EMLA) cream, Apply 1 application  topically every Monday, Wednesday, and Friday with hemodialysis., Disp: , Rfl:    lidocaine-prilocaine (EMLA) cream, Apply to affected area once, Disp: 30 g, Rfl: 3   meclizine (ANTIVERT) 25 MG tablet, Take 1 tablet (25 mg total) by mouth 3 (three) times daily as needed for dizziness., Disp: 30 tablet, Rfl: 1   nebivolol (BYSTOLIC) 10 MG tablet, TAKE 1 TABLET BY MOUTH IN THE MORNING AND AT BEDTIME, Disp: 180 tablet, Rfl: 0   nitroGLYCERIN (NITROSTAT) 0.4 MG SL tablet, Place 1 tablet (0.4 mg total) under the tongue every 5 (five) minutes x 3 doses as needed for chest pain (if no relief after 3rd dose, proceed to ED or call 911)., Disp: 25 tablet, Rfl: 3   ondansetron (ZOFRAN) 4 MG tablet, Take 1 tablet (4 mg total) by mouth every 8 (eight) hours as needed for nausea or vomiting., Disp: 10 tablet, Rfl: 0   ondansetron (ZOFRAN-ODT) 4 MG disintegrating tablet, Take 1 tablet (4 mg total) by mouth every 8 (eight) hours as needed for nausea or vomiting., Disp: 12 tablet, Rfl: 0   oxyCODONE (ROXICODONE) 5 MG immediate release tablet, Take 1 tablet (5 mg total) by mouth every 6 (six) hours as needed., Disp: 30 tablet, Rfl: 0   pantoprazole (PROTONIX) 40 MG tablet, TAKE 1 TABLET(40 MG) BY MOUTH DAILY, Disp: 90 tablet, Rfl: 3   prochlorperazine (COMPAZINE) 10 MG tablet, Take 1 tablet (10 mg total) by mouth every 6 (six) hours as needed for nausea or vomiting., Disp: 60 tablet, Rfl: 3   sucralfate (CARAFATE) 1 GM/10ML suspension, Take 10 mLs (1 g total) by mouth with breakfast, with lunch, and with evening meal., Disp: 420 mL, Rfl: 2   VITAMIN D PO, Take 0.25 mcg by mouth daily., Disp: , Rfl:  No current facility-administered medications for this visit.  Facility-Administered Medications Ordered in Other Visits:    0.9 %  sodium chloride infusion, , Intravenous, Continuous, [name], MD, Stopped at 06/14/23 1454   sodium chloride flush (NS) 0.9 % injection 10 mL, 10 mL, Intracatheter, PRN, [name], MD, 10 mL at 06/14/23 1454   Allergies: No Known Allergies  REVIEW OF SYSTEMS:   Review of Systems  Constitutional:  Negative for chills, fatigue and fever.  HENT:   Negative for lump/mass, mouth sores, nosebleeds, sore throat and trouble swallowing.   Eyes:  Negative for eye problems.  Respiratory:  Negative for cough and shortness of breath.   Cardiovascular:  Negative for chest pain, leg swelling and palpitations.  Gastrointestinal:  Positive for nausea. Negative for abdominal pain, constipation, diarrhea and vomiting.  Genitourinary:  Negative for bladder incontinence, difficulty urinating, dysuria, frequency, hematuria and nocturia.   Musculoskeletal:  Positive for arthralgias (in right shoulder, 6/10 severity) and neck pain (6/10 severity). Negative for back pain, flank pain and myalgias.       +right chest pain, 6/10 severity  Skin:  Negative for itching and rash.  Neurological:  Positive for dizziness. Negative for headaches  and numbness.  Hematological:  Does not bruise/bleed easily.  Psychiatric/Behavioral:  Negative for depression, sleep disturbance and suicidal ideas. The patient is not nervous/anxious.   All other systems reviewed and are negative.    VITALS:   There were no vitals taken for this visit.  Wt Readings from Last 3 Encounters:  06/14/23 171 lb 6.4 oz (77.7 kg)  06/12/23 173 lb 8 oz (78.7 kg)  06/07/23 173 lb 15.1 oz (78.9 kg)    There is no height or weight on file to calculate BMI.  Performance status (ECOG): 1 - Symptomatic but completely ambulatory  PHYSICAL EXAM:   Physical Exam Vitals and nursing note reviewed. Exam conducted with a chaperone present.  Constitutional:      Appearance: Normal appearance.  Cardiovascular:     Rate and Rhythm: Normal rate and regular rhythm.     Pulses: Normal pulses.      Heart sounds: Normal heart sounds.  Pulmonary:     Effort: Pulmonary effort is normal.     Breath sounds: Normal breath sounds.  Abdominal:     Palpations: Abdomen is soft. There is no hepatomegaly, splenomegaly or mass.     Tenderness: There is no abdominal tenderness.  Musculoskeletal:     Right lower leg: No edema.     Left lower leg: No edema.  Lymphadenopathy:     Cervical: No cervical adenopathy.     Right cervical: No superficial, deep or posterior cervical adenopathy.    Left cervical: No superficial, deep or posterior cervical adenopathy.     Upper Body:     Right upper body: No supraclavicular or axillary adenopathy.     Left upper body: No supraclavicular or axillary adenopathy.  Neurological:     General: No focal deficit present.     Mental Status: She is alert and oriented to person, place, and time.  Psychiatric:        Mood and Affect: Mood normal.        Behavior: Behavior normal.     LABS:      Latest Ref Rng & Units 06/14/2023    9:07 AM 06/07/2023    7:00 AM 03/30/2023    6:25 AM  CBC  WBC 4.0 - 10.5 K/uL 6.0   8.3   Hemoglobin 12.0 - 15.0 g/dL 9.9  16.1  09.6   Hematocrit 36.0 - 46.0 % 30.9  31.0  33.2   Platelets 150 - 400 K/uL 243   215       Latest Ref Rng & Units 06/14/2023    9:07 AM 06/07/2023    7:00 AM 03/30/2023    6:25 AM  CMP  Glucose 70 - 99 mg/dL 045  409  811   BUN 8 - 23 mg/dL 31  35  52   Creatinine 0.44 - 1.00 mg/dL 9.14  78.29  56.21   Sodium 135 - 145 mmol/L 138  137  137   Potassium 3.5 - 5.1 mmol/L 4.1  4.7  4.2   Chloride 98 - 111 mmol/L 94  97  94   CO2 22 - 32 mmol/L 28   29   Calcium 8.9 - 10.3 mg/dL 9.2   30.8   Total Protein 6.5 - 8.1 g/dL 7.6   7.5   Total Bilirubin 0.3 - 1.2 mg/dL 0.6   0.5   Alkaline Phos 38 - 126 U/L 121   114   AST 15 - 41 U/L 21   17   ALT 0 -  44 U/L 10   19      No results found for: "CEA1", "CEA" / No results found for: "CEA1", "CEA" No results found for: "PSA1" No results found for:  "ZOX096" No results found for: "CAN125"  No results found for: "TOTALPROTELP", "ALBUMINELP", "A1GS", "A2GS", "BETS", "BETA2SER", "GAMS", "MSPIKE", "SPEI" Lab Results  Component Value Date   TIBC 195 (L) 02/15/2021   TIBC 190 (L) 12/10/2018   TIBC 218 (L) 06/08/2016   FERRITIN 1,742 (H) 02/15/2021   FERRITIN 1,308 (H) 12/10/2018   FERRITIN 126 06/08/2016   IRONPCTSAT 15 02/15/2021   IRONPCTSAT 43 (H) 12/10/2018   IRONPCTSAT 22 06/08/2016   No results found for: "LDH"   STUDIES:   NM PET Image Restag (PS) Skull Base To Thigh  Result Date: 06/13/2023 CLINICAL DATA:  Subsequent treatment strategy for metastatic adenocarcinoma. Post RIGHT mastectomy RIGHT breast cancer. EXAM: NUCLEAR MEDICINE PET SKULL BASE TO THIGH TECHNIQUE: 9.3 mCi F-18 FDG was injected intravenously. Full-ring PET imaging was performed from the skull base to thigh after the radiotracer. CT data was obtained and used for attenuation correction and anatomic localization. Fasting blood glucose: 113 mg/dl COMPARISON:  PET scan 04/54/0981 FINDINGS: Mediastinal blood pool activity: SUV max Liver activity: SUV max NA NECK: No hypermetabolic lymph nodes in the neck. Incidental CT findings: None. CHEST: Persistent hypermetabolic tissue in the LEFT chest wall localizing to the pectoralis muscle. Activity is intense (SUV max equal 5.7) but significantly decreased from comparison exam (SUV max lesion not well identified on the noncontrast CT. No hypermetabolic supraclavicular axillary lymph nodes. No hypermetabolic mediastinal lymph nodes. Intense metabolic activity associated with musculature superficial to the RIGHT shoulder is favored benign physiologic. Similar activity in the proximal LEFT upper arm. Incidental CT findings: None. ABDOMEN/PELVIS: Intense metabolic activity associated with the gastric antrum. No focal lesion identified (image 91). No abnormal metabolic activity liver. No hypermetabolic abdominopelvic lymph nodes.  Incidental CT findings: None. SKELETON: No aggressive osseous lesion Incidental CT findings: None. IMPRESSION: 1. Marked decrease in metabolic activity localizing to the RIGHT pectoralis muscle. Residual metabolic activity may represent treatment effect or residual carcinoma. 2. No additional evidence of metastatic breast carcinoma. 3. Metabolic activity in the distal stomach is favored inflammatory. Electronically Signed   By: Genevive Bi M.D.   On: 06/13/2023 16:29   DG Chest Port 1 View  Result Date: 06/07/2023 CLINICAL DATA:  Status post Port-A-Cath insertion EXAM: PORTABLE CHEST 1 VIEW COMPARISON:  Chest x-ray dated September 12, 2022 FINDINGS: Interval placement of right-sided port with tip overlying the expected area of the lower SVC. The heart size and mediastinal contours are within normal limits. Both lungs are clear. No evidence of pleural effusion or pneumothorax. IMPRESSION: Right-sided port with tip overlying the expected area of the lower SVC. Electronically Signed   By: Allegra Lai M.D.   On: 06/07/2023 12:26   DG C-Arm 1-60 Min-No Report  Result Date: 06/07/2023 Fluoroscopy was utilized by the requesting physician.  No radiographic interpretation.

## 2023-06-14 ENCOUNTER — Inpatient Hospital Stay: Payer: Medicare Other | Admitting: Hematology

## 2023-06-14 ENCOUNTER — Inpatient Hospital Stay: Payer: Medicare Other

## 2023-06-14 VITALS — BP 123/67 | HR 75 | Temp 98.0°F | Resp 18

## 2023-06-14 DIAGNOSIS — Z79899 Other long term (current) drug therapy: Secondary | ICD-10-CM | POA: Diagnosis not present

## 2023-06-14 DIAGNOSIS — Z170 Estrogen receptor positive status [ER+]: Secondary | ICD-10-CM | POA: Diagnosis not present

## 2023-06-14 DIAGNOSIS — M8580 Other specified disorders of bone density and structure, unspecified site: Secondary | ICD-10-CM | POA: Diagnosis not present

## 2023-06-14 DIAGNOSIS — C50911 Malignant neoplasm of unspecified site of right female breast: Secondary | ICD-10-CM

## 2023-06-14 DIAGNOSIS — Z79811 Long term (current) use of aromatase inhibitors: Secondary | ICD-10-CM | POA: Diagnosis not present

## 2023-06-14 DIAGNOSIS — C799 Secondary malignant neoplasm of unspecified site: Secondary | ICD-10-CM

## 2023-06-14 DIAGNOSIS — C50211 Malignant neoplasm of upper-inner quadrant of right female breast: Secondary | ICD-10-CM | POA: Diagnosis not present

## 2023-06-14 DIAGNOSIS — Z5111 Encounter for antineoplastic chemotherapy: Secondary | ICD-10-CM | POA: Diagnosis not present

## 2023-06-14 DIAGNOSIS — E559 Vitamin D deficiency, unspecified: Secondary | ICD-10-CM | POA: Diagnosis not present

## 2023-06-14 LAB — CBC WITH DIFFERENTIAL/PLATELET
Abs Immature Granulocytes: 0.03 10*3/uL (ref 0.00–0.07)
Basophils Absolute: 0.1 10*3/uL (ref 0.0–0.1)
Basophils Relative: 1 %
Eosinophils Absolute: 0.2 10*3/uL (ref 0.0–0.5)
Eosinophils Relative: 4 %
HCT: 30.9 % — ABNORMAL LOW (ref 36.0–46.0)
Hemoglobin: 9.9 g/dL — ABNORMAL LOW (ref 12.0–15.0)
Immature Granulocytes: 1 %
Lymphocytes Relative: 14 %
Lymphs Abs: 0.8 10*3/uL (ref 0.7–4.0)
MCH: 33.9 pg (ref 26.0–34.0)
MCHC: 32 g/dL (ref 30.0–36.0)
MCV: 105.8 fL — ABNORMAL HIGH (ref 80.0–100.0)
Monocytes Absolute: 0.9 10*3/uL (ref 0.1–1.0)
Monocytes Relative: 15 %
Neutro Abs: 4 10*3/uL (ref 1.7–7.7)
Neutrophils Relative %: 65 %
Platelets: 243 10*3/uL (ref 150–400)
RBC: 2.92 MIL/uL — ABNORMAL LOW (ref 3.87–5.11)
RDW: 15.2 % (ref 11.5–15.5)
WBC: 6 10*3/uL (ref 4.0–10.5)
nRBC: 0 % (ref 0.0–0.2)

## 2023-06-14 LAB — COMPREHENSIVE METABOLIC PANEL
ALT: 10 U/L (ref 0–44)
AST: 21 U/L (ref 15–41)
Albumin: 3.5 g/dL (ref 3.5–5.0)
Alkaline Phosphatase: 121 U/L (ref 38–126)
Anion gap: 16 — ABNORMAL HIGH (ref 5–15)
BUN: 31 mg/dL — ABNORMAL HIGH (ref 8–23)
CO2: 28 mmol/L (ref 22–32)
Calcium: 9.2 mg/dL (ref 8.9–10.3)
Chloride: 94 mmol/L — ABNORMAL LOW (ref 98–111)
Creatinine, Ser: 9.76 mg/dL — ABNORMAL HIGH (ref 0.44–1.00)
GFR, Estimated: 4 mL/min — ABNORMAL LOW (ref >60)
Glucose, Bld: 146 mg/dL — ABNORMAL HIGH (ref 70–99)
Potassium: 4.1 mmol/L (ref 3.5–5.1)
Sodium: 138 mmol/L (ref 135–145)
Total Bilirubin: 0.6 mg/dL (ref 0.3–1.2)
Total Protein: 7.6 g/dL (ref 6.5–8.1)

## 2023-06-14 LAB — TSH: TSH: 2.668 u[IU]/mL (ref 0.350–4.500)

## 2023-06-14 LAB — MAGNESIUM: Magnesium: 2.4 mg/dL (ref 1.7–2.4)

## 2023-06-14 MED ORDER — SODIUM CHLORIDE 0.9 % IV SOLN
200.0000 mg | Freq: Once | INTRAVENOUS | Status: AC
  Administered 2023-06-14: 200 mg via INTRAVENOUS
  Filled 2023-06-14: qty 8

## 2023-06-14 MED ORDER — ACETAMINOPHEN 500 MG PO TABS
500.0000 mg | ORAL_TABLET | Freq: Once | ORAL | Status: AC
  Administered 2023-06-14: 500 mg via ORAL
  Filled 2023-06-14: qty 1

## 2023-06-14 MED ORDER — DEXAMETHASONE SODIUM PHOSPHATE 100 MG/10ML IJ SOLN: 10.0000 mg | Freq: Once | INTRAMUSCULAR | Status: DC

## 2023-06-14 MED ORDER — SODIUM CHLORIDE 0.9 % IV SOLN
60.0000 mg/m2 | Freq: Once | INTRAVENOUS | Status: AC
Start: 2023-06-14 — End: 2023-06-14
  Administered 2023-06-14: 112 mg via INTRAVENOUS
  Filled 2023-06-14: qty 11.2

## 2023-06-14 MED ORDER — HEPARIN SOD (PORK) LOCK FLUSH 100 UNIT/ML IV SOLN
500.0000 [IU] | Freq: Once | INTRAVENOUS | Status: AC | PRN
Start: 2023-06-14 — End: 2023-06-14
  Administered 2023-06-14: 500 [IU]

## 2023-06-14 MED ORDER — SODIUM CHLORIDE 0.9% FLUSH
10.0000 mL | INTRAVENOUS | Status: DC | PRN
  Administered 2023-06-14: 10 mL

## 2023-06-14 MED ORDER — DEXAMETHASONE SODIUM PHOSPHATE 10 MG/ML IJ SOLN
10.0000 mg | Freq: Once | INTRAMUSCULAR | Status: AC
Start: 2023-06-14 — End: 2023-06-14
  Administered 2023-06-14: 10 mg via INTRAVENOUS
  Filled 2023-06-14: qty 1

## 2023-06-14 MED ORDER — SODIUM CHLORIDE 0.9 % IV SOLN
162.5000 mg | Freq: Once | INTRAVENOUS | Status: AC
Start: 2023-06-14 — End: 2023-06-14
  Administered 2023-06-14: 160 mg via INTRAVENOUS
  Filled 2023-06-14: qty 16

## 2023-06-14 MED ORDER — OXYCODONE HCL 5 MG PO TABS
5.0000 mg | ORAL_TABLET | Freq: Once | ORAL | Status: AC
  Administered 2023-06-14: 5 mg via ORAL
  Filled 2023-06-14: qty 1

## 2023-06-14 MED ORDER — PALONOSETRON HCL INJECTION 0.25 MG/5ML
0.2500 mg | Freq: Once | INTRAVENOUS | Status: AC
  Administered 2023-06-14: 0.25 mg via INTRAVENOUS
  Filled 2023-06-14: qty 5

## 2023-06-14 MED ORDER — LIDOCAINE-PRILOCAINE 2.5-2.5 % EX CREA: TOPICAL_CREAM | CUTANEOUS | 3 refills | Status: DC

## 2023-06-14 MED ORDER — SODIUM CHLORIDE 0.9% FLUSH
10.0000 mL | Freq: Once | INTRAVENOUS | Status: AC
  Administered 2023-06-14: 10 mL via INTRAVENOUS

## 2023-06-14 MED ORDER — SODIUM CHLORIDE 0.9 % IV SOLN
150.0000 mg | Freq: Once | INTRAVENOUS | Status: AC
  Administered 2023-06-14: 150 mg via INTRAVENOUS
  Filled 2023-06-14: qty 5

## 2023-06-14 MED ORDER — PROCHLORPERAZINE MALEATE 10 MG PO TABS
10.0000 mg | ORAL_TABLET | Freq: Once | ORAL | Status: AC
  Administered 2023-06-14: 10 mg via ORAL
  Filled 2023-06-14: qty 1

## 2023-06-14 MED ORDER — PROCHLORPERAZINE MALEATE 10 MG PO TABS: 10.0000 mg | ORAL_TABLET | Freq: Four times a day (QID) | ORAL | 3 refills | Status: DC | PRN

## 2023-06-14 MED ORDER — SODIUM CHLORIDE 0.9 % IV SOLN: INTRAVENOUS | Status: DC

## 2023-06-14 NOTE — Progress Notes (Signed)
Chemotherapy consent signed with all questions asked and answered.   Patient tolerated chemotherapy with no complaints voiced.  Side effects with management reviewed with understanding verbalized.  Port site clean and dry with no bruising or swelling noted at site.  Good blood return noted before and after administration of chemotherapy.  Band aid applied.  Patient left in satisfactory condition with VSS and no s/s of distress noted.

## 2023-06-14 NOTE — Progress Notes (Signed)
OK to treat with elevated creatinine. Patient receives dialysis Monday/Wednesday/Friday.  Ok to update treatment conditions to disregard future labs with creatinine.   T.O. Dr Fulton Mole Charlestine Massed

## 2023-06-14 NOTE — Patient Instructions (Signed)
[facility]  Discharge Instructions: Thank you for choosing [facility] to provide your oncology and hematology care.  If you have a lab appointment with the Cancer Center - please note that after April 8th, 2024, all labs will be drawn in the cancer center.  You do not have to check in or register with the main entrance as you have in the past but will complete your check-in in the cancer center.  Wear comfortable clothing and clothing appropriate for easy access to any Portacath or PICC line.   We strive to give you quality time with your provider. You may need to reschedule your appointment if you arrive late (15 or more minutes).  Arriving late affects you and other patients whose appointments are after yours.  Also, if you miss three or more appointments without notifying the office, you may be dismissed from the clinic at the provider's discretion.      For prescription refill requests, have your pharmacy contact our office and allow 72 hours for refills to be completed.    Today you received the following chemotherapy and/or immunotherapy agents keytruda, taxotere, carboplatin.        To help prevent nausea and vomiting after your treatment, we encourage you to take your nausea medication as directed.  BELOW ARE SYMPTOMS THAT SHOULD BE REPORTED IMMEDIATELY: *FEVER GREATER THAN 100.4 F (38 C) OR HIGHER *CHILLS OR SWEATING *NAUSEA AND VOMITING THAT IS NOT CONTROLLED WITH YOUR NAUSEA MEDICATION *UNUSUAL SHORTNESS OF BREATH *UNUSUAL BRUISING OR BLEEDING *URINARY PROBLEMS (pain or burning when urinating, or frequent urination) *BOWEL PROBLEMS (unusual diarrhea, constipation, pain near the anus) TENDERNESS IN MOUTH AND THROAT WITH OR WITHOUT PRESENCE OF ULCERS (sore throat, sores in mouth, or a toothache) UNUSUAL RASH, SWELLING OR PAIN  UNUSUAL VAGINAL DISCHARGE OR ITCHING   Items with * indicate a potential emergency and should be followed up as soon  as possible or go to the Emergency Department if any problems should occur.  Please show the CHEMOTHERAPY ALERT CARD or IMMUNOTHERAPY ALERT CARD at check-in to the Emergency Department and triage nurse.  Should you have questions after your visit or need to cancel or reschedule your appointment, please contact [facility] [phone]  and follow the prompts.  Office hours are 8:00 a.m. to 4:30 p.m. Monday - Friday. Please note that voicemails left after 4:00 p.m. may not be returned until the following business day.  We are closed weekends and major holidays. You have access to a nurse at all times for urgent questions. Please call the main number to the clinic [phone] and follow the prompts.  For any non-urgent questions, you may also contact your provider using MyChart. We now offer e-Visits for anyone 40 and older to request care online for non-urgent symptoms. For details visit [email or web address].   Also download the MyChart app! Go to the app store, search "MyChart", open the app, select [place], and log in with your MyChart username and password.

## 2023-06-14 NOTE — Progress Notes (Signed)
Patient complained of nausea, MD notified and will give PO compazine 10 mg per MD order.

## 2023-06-14 NOTE — Progress Notes (Addendum)
Pharmacist Chemotherapy Monitoring - Initial Assessment    Anticipated start date: 06/14/23   The following has been reviewed per standard work regarding the patient's treatment regimen: The patient's diagnosis, treatment plan and drug doses, and organ/hematologic function Lab orders and baseline tests specific to treatment regimen  The treatment plan start date, drug sequencing, and pre-medications Prior authorization status  Patient's documented medication list, including drug-drug interaction screen and prescriptions for anti-emetics and supportive care specific to the treatment regimen The drug concentrations, fluid compatibility, administration routes, and timing of the medications to be used The patient's access for treatment and lifetime cumulative dose history, if applicable  The patient's medication allergies and previous infusion related reactions, if applicable   Changes made to treatment plan:  N/A  Follow up needed:  N/A  Patient on dialysis Monday/Wednesday/Friday.  Previous treatment with Adriamycin/Cyclophosphamide.     Stephens Shire, [facility], 06/14/2023  10:58 AM

## 2023-06-14 NOTE — Patient Instructions (Signed)
[facility] Discharge Instructions   You were seen and examined today by Dr. Katragadda.  He reviewed the results of your lab work which are normal/stable.   We will proceed with your treatment today.  Return as scheduled.    Thank you for choosing [facility] at [facility] to provide your oncology and hematology care.  To afford each patient quality time with our provider, please arrive at least 15 minutes before your scheduled appointment time.   If you have a lab appointment with the Cancer Center please come in thru the Main Entrance and check in at the main information desk.  You need to re-schedule your appointment should you arrive 10 or more minutes late.  We strive to give you quality time with our providers, and arriving late affects you and other patients whose appointments are after yours.  Also, if you no show three or more times for appointments you may be dismissed from the clinic at the providers discretion.     Again, thank you for choosing [facility].  Our hope is that these requests will decrease the amount of time that you wait before being seen by our physicians.       _____________________________________________________________  Should you have questions after your visit to [facility], please contact our office at [phone] and follow the prompts.  Our office hours are 8:00 a.m. and 4:30 p.m. Monday - Friday.  Please note that voicemails left after 4:00 p.m. may not be returned until the following business day.  We are closed weekends and major holidays.  You do have access to a nurse 24-7, just call the main number to the clinic [phone] and do not press any options, hold on the line and a nurse will answer the phone.    For prescription refill requests, have your pharmacy contact our office and allow 72 hours.    Due to Covid, you will need to wear a mask upon entering  the hospital. If you do not have a mask, a mask will be given to you at the Main Entrance upon arrival. For doctor visits, patients may have 1 support person age 18 or older with them. For treatment visits, patients can not have anyone with them due to social distancing guidelines and our immunocompromised population.      

## 2023-06-15 ENCOUNTER — Telehealth: Payer: Self-pay

## 2023-06-15 ENCOUNTER — Inpatient Hospital Stay: Payer: Medicare Other | Attending: Hematology

## 2023-06-15 ENCOUNTER — Other Ambulatory Visit: Payer: Self-pay | Admitting: Family Medicine

## 2023-06-15 VITALS — BP 123/66 | HR 81 | Temp 98.2°F | Resp 17

## 2023-06-15 DIAGNOSIS — N186 End stage renal disease: Secondary | ICD-10-CM | POA: Insufficient documentation

## 2023-06-15 DIAGNOSIS — Z9013 Acquired absence of bilateral breasts and nipples: Secondary | ICD-10-CM | POA: Insufficient documentation

## 2023-06-15 DIAGNOSIS — M8580 Other specified disorders of bone density and structure, unspecified site: Secondary | ICD-10-CM | POA: Diagnosis not present

## 2023-06-15 DIAGNOSIS — U071 COVID-19: Secondary | ICD-10-CM | POA: Diagnosis not present

## 2023-06-15 DIAGNOSIS — R531 Weakness: Secondary | ICD-10-CM | POA: Diagnosis not present

## 2023-06-15 DIAGNOSIS — Z992 Dependence on renal dialysis: Secondary | ICD-10-CM | POA: Diagnosis not present

## 2023-06-15 DIAGNOSIS — E1136 Type 2 diabetes mellitus with diabetic cataract: Secondary | ICD-10-CM | POA: Diagnosis not present

## 2023-06-15 DIAGNOSIS — R918 Other nonspecific abnormal finding of lung field: Secondary | ICD-10-CM | POA: Diagnosis not present

## 2023-06-15 DIAGNOSIS — C7989 Secondary malignant neoplasm of other specified sites: Secondary | ICD-10-CM | POA: Diagnosis not present

## 2023-06-15 DIAGNOSIS — R0789 Other chest pain: Secondary | ICD-10-CM | POA: Diagnosis not present

## 2023-06-15 DIAGNOSIS — C50911 Malignant neoplasm of unspecified site of right female breast: Secondary | ICD-10-CM

## 2023-06-15 DIAGNOSIS — E1122 Type 2 diabetes mellitus with diabetic chronic kidney disease: Secondary | ICD-10-CM | POA: Diagnosis not present

## 2023-06-15 DIAGNOSIS — Z79899 Other long term (current) drug therapy: Secondary | ICD-10-CM | POA: Diagnosis not present

## 2023-06-15 DIAGNOSIS — Z5111 Encounter for antineoplastic chemotherapy: Secondary | ICD-10-CM | POA: Insufficient documentation

## 2023-06-15 DIAGNOSIS — R197 Diarrhea, unspecified: Secondary | ICD-10-CM | POA: Insufficient documentation

## 2023-06-15 DIAGNOSIS — I132 Hypertensive heart and chronic kidney disease with heart failure and with stage 5 chronic kidney disease, or end stage renal disease: Secondary | ICD-10-CM | POA: Diagnosis not present

## 2023-06-15 DIAGNOSIS — N2581 Secondary hyperparathyroidism of renal origin: Secondary | ICD-10-CM | POA: Diagnosis not present

## 2023-06-15 DIAGNOSIS — N040 Nephrotic syndrome with minor glomerular abnormality: Secondary | ICD-10-CM | POA: Diagnosis not present

## 2023-06-15 DIAGNOSIS — D631 Anemia in chronic kidney disease: Secondary | ICD-10-CM | POA: Diagnosis not present

## 2023-06-15 DIAGNOSIS — C50211 Malignant neoplasm of upper-inner quadrant of right female breast: Secondary | ICD-10-CM | POA: Insufficient documentation

## 2023-06-15 DIAGNOSIS — E1129 Type 2 diabetes mellitus with other diabetic kidney complication: Secondary | ICD-10-CM | POA: Diagnosis not present

## 2023-06-15 DIAGNOSIS — Z170 Estrogen receptor positive status [ER+]: Secondary | ICD-10-CM | POA: Insufficient documentation

## 2023-06-15 MED ORDER — PEGFILGRASTIM-FPGK 6 MG/0.6ML ~~LOC~~ SOSY
6.0000 mg | PREFILLED_SYRINGE | Freq: Once | SUBCUTANEOUS | Status: AC
  Administered 2023-06-15: 6 mg via SUBCUTANEOUS
  Filled 2023-06-15: qty 0.6

## 2023-06-15 NOTE — Progress Notes (Signed)
Message received from [name], MD- Please call and let her know to stop anastrozole while she is receiving chemotherapy.  Called patient  and she is aware and agreeable with plan to stop anastrozole while she is receiving chemotherapy.

## 2023-06-15 NOTE — Telephone Encounter (Signed)
Requested medication (s) are due for refill today: Yes  Requested medication (s) are on the active medication list: Yes  Last refill:  03/15/23  Future visit scheduled: No  Notes to clinic:  Unable to refill per protocol due to failed labs, no updated results.      Requested Prescriptions  Pending Prescriptions Disp Refills   atorvastatin (LIPITOR) 20 MG tablet [Pharmacy Med Name: ATORVASTATIN 20MG  TABLETS] 90 tablet 0    Sig: TAKE 1 TABLET(20 MG) BY MOUTH DAILY     Cardiovascular:  Antilipid - Statins Failed - 06/15/2023  3:35 AM      Failed - Valid encounter within last 12 months    Recent Outpatient Visits           1 year ago Medication care plan discussed with patient   [facility] Donita Brooks, MD   1 year ago Dyspnea, unspecified type   [facility] Donita Brooks, MD   2 years ago Clostridium difficile colitis   [facility] Donita Brooks, MD   2 years ago Clostridium difficile colitis   [facility] Donita Brooks, MD   2 years ago Clostridium difficile colitis   [facility] Pickard, Priscille Heidelberg, MD       Future Appointments             In 1 week [name], MD [facility]            Failed - Lipid Panel in normal range within the last 12 months    Cholesterol  Date Value Ref Range Status  11/25/2020 141 <200 mg/dL Final   LDL Cholesterol (Calc)  Date Value Ref Range Status  11/25/2020 72 mg/dL (calc) Final    Comment:    Reference range: <100 . Desirable range <100 mg/dL for primary prevention;   <70 mg/dL for patients with CHD or diabetic patients  with > or = 2 CHD risk factors. Marland Kitchen LDL-C is now calculated using the Martin-Hopkins  calculation, which is a validated novel method providing  better accuracy than the Friedewald equation in the  estimation of LDL-C.  Horald Pollen et al. Lenox Ahr. 7106;269(48): 2061-2068   (http://education.[employer].com/faq/FAQ164)    Direct LDL  Date Value Ref Range Status  03/02/2022 105 (H) <100 mg/dL Final    Comment:    Greatly elevated Triglycerides values (>1200 mg/dL) interfere with the dLDL assay. As no Triglycerides  testing was ordered, interpret results with caution. . Desirable range <100 mg/dL for primary prevention;   <70 mg/dL for patients with CHD or diabetic patients  with > or = 2 CHD risk factors. Marland Kitchen    HDL  Date Value Ref Range Status  11/25/2020 55 > OR = 50 mg/dL Final   Triglycerides  Date Value Ref Range Status  11/25/2020 67 <150 mg/dL Final         Passed - Patient is not pregnant

## 2023-06-15 NOTE — Telephone Encounter (Signed)
Chemotherapy 24 hour follow up call.  Patient stated her pain and nausea was better after the medicines she received post infusion yesterday.  No other complaints voiced.  Reminded of triage line after hours with understanding verbalized.

## 2023-06-15 NOTE — Progress Notes (Signed)
Patient tolerated injection with no complaints voiced.  Site clean and dry with no bruising or swelling noted at site.  See MAR for details.  Band aid applied.  Patient stable during and after injection.  Vss with discharge and left in satisfactory condition with no s/s of distress noted.  

## 2023-06-15 NOTE — Patient Instructions (Signed)

## 2023-06-18 DIAGNOSIS — N186 End stage renal disease: Secondary | ICD-10-CM | POA: Diagnosis not present

## 2023-06-18 DIAGNOSIS — N2581 Secondary hyperparathyroidism of renal origin: Secondary | ICD-10-CM | POA: Diagnosis not present

## 2023-06-18 DIAGNOSIS — D631 Anemia in chronic kidney disease: Secondary | ICD-10-CM | POA: Diagnosis not present

## 2023-06-18 DIAGNOSIS — Z992 Dependence on renal dialysis: Secondary | ICD-10-CM | POA: Diagnosis not present

## 2023-06-18 DIAGNOSIS — E1129 Type 2 diabetes mellitus with other diabetic kidney complication: Secondary | ICD-10-CM | POA: Diagnosis not present

## 2023-06-19 ENCOUNTER — Telehealth: Payer: Self-pay

## 2023-06-19 NOTE — Telephone Encounter (Signed)
Patient called stating she had some diarrhea Saturday and used her imodium with relief and slept most of the day Sunday.  She did "pass out" form low Blood pressure on Monday per the patient.  Stated she feels fine today but her foods do taste bad.  She is on a blood pressure medication by her PCP.  Instructed the patient to check her blood pressure in the mornings and during the day and keep a record.  Also, instructed the patient she could have been dehydrated from the diarrhea and to call for any more episodes in case she needs fluids and to report to the ER if she passes out again.  Reviewed how to manage foods to taste better.  Patient verbalized understanding.  Message sent to provider.

## 2023-06-20 ENCOUNTER — Encounter (HOSPITAL_COMMUNITY): Payer: Self-pay | Admitting: Radiology

## 2023-06-20 ENCOUNTER — Emergency Department (HOSPITAL_COMMUNITY)
Admission: EM | Admit: 2023-06-20 | Discharge: 2023-06-21 | Disposition: A | Discharge status: Home / Self Care | Payer: Medicare Other | Attending: Emergency Medicine | Admitting: Emergency Medicine

## 2023-06-20 DIAGNOSIS — N186 End stage renal disease: Secondary | ICD-10-CM | POA: Diagnosis not present

## 2023-06-20 DIAGNOSIS — R630 Anorexia: Secondary | ICD-10-CM | POA: Insufficient documentation

## 2023-06-20 DIAGNOSIS — R109 Unspecified abdominal pain: Secondary | ICD-10-CM | POA: Diagnosis not present

## 2023-06-20 DIAGNOSIS — E1129 Type 2 diabetes mellitus with other diabetic kidney complication: Secondary | ICD-10-CM | POA: Diagnosis not present

## 2023-06-20 DIAGNOSIS — E1122 Type 2 diabetes mellitus with diabetic chronic kidney disease: Secondary | ICD-10-CM | POA: Diagnosis not present

## 2023-06-20 DIAGNOSIS — Z7982 Long term (current) use of aspirin: Secondary | ICD-10-CM | POA: Insufficient documentation

## 2023-06-20 DIAGNOSIS — R197 Diarrhea, unspecified: Secondary | ICD-10-CM | POA: Diagnosis not present

## 2023-06-20 DIAGNOSIS — I120 Hypertensive chronic kidney disease with stage 5 chronic kidney disease or end stage renal disease: Secondary | ICD-10-CM | POA: Diagnosis not present

## 2023-06-20 DIAGNOSIS — N2581 Secondary hyperparathyroidism of renal origin: Secondary | ICD-10-CM | POA: Diagnosis not present

## 2023-06-20 DIAGNOSIS — Z853 Personal history of malignant neoplasm of breast: Secondary | ICD-10-CM | POA: Insufficient documentation

## 2023-06-20 DIAGNOSIS — Z79899 Other long term (current) drug therapy: Secondary | ICD-10-CM | POA: Diagnosis not present

## 2023-06-20 DIAGNOSIS — Z992 Dependence on renal dialysis: Secondary | ICD-10-CM | POA: Diagnosis not present

## 2023-06-20 DIAGNOSIS — D631 Anemia in chronic kidney disease: Secondary | ICD-10-CM | POA: Diagnosis not present

## 2023-06-20 LAB — COMPREHENSIVE METABOLIC PANEL
ALT: 20 U/L (ref 0–44)
AST: 19 U/L (ref 15–41)
Albumin: 3.1 g/dL — ABNORMAL LOW (ref 3.5–5.0)
Alkaline Phosphatase: 102 U/L (ref 38–126)
Anion gap: 13 (ref 5–15)
BUN: 44 mg/dL — ABNORMAL HIGH (ref 8–23)
CO2: 29 mmol/L (ref 22–32)
Calcium: 9.4 mg/dL (ref 8.9–10.3)
Chloride: 94 mmol/L — ABNORMAL LOW (ref 98–111)
Creatinine, Ser: 10.04 mg/dL — ABNORMAL HIGH (ref 0.44–1.00)
GFR, Estimated: 4 mL/min — ABNORMAL LOW (ref >60)
Glucose, Bld: 108 mg/dL — ABNORMAL HIGH (ref 70–99)
Potassium: 4.2 mmol/L (ref 3.5–5.1)
Sodium: 136 mmol/L (ref 135–145)
Total Bilirubin: 0.4 mg/dL (ref <1.2)
Total Protein: 6.9 g/dL (ref 6.5–8.1)

## 2023-06-20 LAB — CBC
HCT: 28.1 % — ABNORMAL LOW (ref 36.0–46.0)
Hemoglobin: 9.1 g/dL — ABNORMAL LOW (ref 12.0–15.0)
MCH: 33.7 pg (ref 26.0–34.0)
MCHC: 32.4 g/dL (ref 30.0–36.0)
MCV: 104.1 fL — ABNORMAL HIGH (ref 80.0–100.0)
Platelets: 157 10*3/uL (ref 150–400)
RBC: 2.7 MIL/uL — ABNORMAL LOW (ref 3.87–5.11)
RDW: 14.4 % (ref 11.5–15.5)
WBC: 2.4 10*3/uL — ABNORMAL LOW (ref 4.0–10.5)
nRBC: 0 % (ref 0.0–0.2)

## 2023-06-20 LAB — LIPASE, BLOOD: Lipase: 32 U/L (ref 11–51)

## 2023-06-20 NOTE — ED Triage Notes (Signed)
Pt started chemo last Thursday for breast cancer. Pt having diarrhea since starting her chem. Pt unable to eat without having diarrhea. Pt is also a dialysis patient so she was unable to finish her dialysis today. Pt states she is also having tingling in her left hand and left knee.

## 2023-06-21 DIAGNOSIS — R197 Diarrhea, unspecified: Secondary | ICD-10-CM | POA: Diagnosis not present

## 2023-06-21 MED ORDER — ONDANSETRON HCL 4 MG/2ML IJ SOLN
4.0000 mg | Freq: Once | INTRAMUSCULAR | Status: AC
  Administered 2023-06-21: 4 mg via INTRAVENOUS
  Filled 2023-06-21: qty 2

## 2023-06-21 MED ORDER — ACETAMINOPHEN 325 MG PO TABS
650.0000 mg | ORAL_TABLET | Freq: Once | ORAL | Status: AC
  Administered 2023-06-21: 650 mg via ORAL

## 2023-06-21 MED ORDER — LOPERAMIDE HCL 2 MG PO CAPS
4.0000 mg | ORAL_CAPSULE | Freq: Once | ORAL | Status: AC
  Administered 2023-06-21: 4 mg via ORAL
  Filled 2023-06-21: qty 2

## 2023-06-21 MED ORDER — ACETAMINOPHEN 325 MG PO TABS
ORAL_TABLET | ORAL | Status: AC
  Filled 2023-06-21: qty 2

## 2023-06-21 NOTE — Discharge Instructions (Signed)
You were seen today for diarrhea and nausea.  Continue your medications at home.  This is likely a side effect of your ongoing cancer treatment.  Make sure that you are staying hydrated.

## 2023-06-21 NOTE — ED Provider Notes (Signed)
[place] EMERGENCY DEPARTMENT AT [facility] Provider Note   CSN: [id number] Arrival date & time: 06/20/23  2033     History  Chief Complaint  Patient presents with   Diarrhea    Megan [name] is a 62 y.o. female.  HPI     This is a 62 year old female who presents with anorexia and diarrhea.  Patient reported that she started chemotherapy last Thursday.  She is also on immunotherapy.  Since Friday she has had a metallic taste in her mouth and "anything I eat goes right through me."  She reports nonbloody diarrhea.  She has taken Imodium with some relief.  She is also had some nausea and vomiting most recently while in the waiting room earlier.  Denies abdominal pain, chest pain, shortness of breath.  Home Medications Prior to Admission medications   Medication Sig Start Date End Date Taking? Authorizing Provider  acetaminophen (TYLENOL) 500 MG tablet Take 500-1,000 mg by mouth every 6 (six) hours as needed (pain.).    [provider]  amLODipine (NORVASC) 10 MG tablet TAKE 1 TABLET BY MOUTH AT BEDTIME 08/15/22   Donita Brooks, MD  anastrozole (ARIMIDEX) 1 MG tablet TAKE 1 TABLET BY MOUTH DAILY 01/16/23   [name], MD  aspirin EC 81 MG tablet Take 81 mg by mouth in the morning.    [provider]  atorvastatin (LIPITOR) 20 MG tablet TAKE 1 TABLET(20 MG) BY MOUTH DAILY 03/15/23   Donita Brooks, MD  B Complex-C-Zn-Folic Acid (DIALYVITE 800-ZINC 15) 0.8 MG TABS Take 1 tablet by mouth in the morning. 06/19/22   [provider]  cinacalcet (SENSIPAR) 30 MG tablet Take 2 tablets (60 mg total) by mouth daily. Patient taking differently: Take 90 mg by mouth daily with breakfast. With a meal 06/13/18   Donita Brooks, MD  Darbepoetin Alfa (ARANESP, ALBUMIN FREE, IJ) every Monday, Wednesday, and Friday with hemodialysis. 06/23/22 06/22/23  [provider]  fluticasone (FLONASE) 50 MCG/ACT nasal spray Place 2 sprays into both  nostrils daily. Patient taking differently: Place 2 sprays into both nostrils daily as needed for allergies or rhinitis. 10/20/22   Donita Brooks, MD  gabapentin (NEURONTIN) 300 MG capsule TAKE 1 CAPSULE(300 MG) BY MOUTH AT BEDTIME 06/12/23   Donita Brooks, MD  iron sucrose (VENOFER) 20 MG/ML injection Inject 50 mg into the vein once a week. 02/19/23 02/18/24  [provider]  isosorbide mononitrate (IMDUR) 30 MG 24 hr tablet Take 1 tablet (30 mg total) by mouth 2 (two) times daily. Patient taking differently: Take 30 mg by mouth daily. 04/17/23 07/16/23  Furth, Cadence H, PA-C  lanthanum (FOSRENOL) 1000 MG chewable tablet Chew 2,000-3,000 mg by mouth See admin instructions. Take 3 tablets (3000 mg) by mouth with meals and take 2 tablets (2000 mg) with snacks 10/16/19   [provider]  lidocaine-prilocaine (EMLA) cream Apply 1 application  topically every Monday, Wednesday, and Friday with hemodialysis. 09/24/18   [provider]  lidocaine-prilocaine (EMLA) cream Apply to affected area once 06/14/23   [name], MD  meclizine (ANTIVERT) 25 MG tablet Take 1 tablet (25 mg total) by mouth 3 (three) times daily as needed for dizziness. 06/12/23   Donita Brooks, MD  nebivolol (BYSTOLIC) 10 MG tablet TAKE 1 TABLET BY MOUTH IN THE MORNING AND AT BEDTIME 04/24/23   Donita Brooks, MD  nitroGLYCERIN (NITROSTAT) 0.4 MG SL tablet Place 1 tablet (0.4 mg total) under the tongue  every 5 (five) minutes x 3 doses as needed for chest pain (if no relief after 3rd dose, proceed to ED or call 911). 09/13/22   Antoine Poche, MD  ondansetron (ZOFRAN) 4 MG tablet Take 1 tablet (4 mg total) by mouth every 8 (eight) hours as needed for nausea or vomiting. 11/09/22   Park Meo, FNP  ondansetron (ZOFRAN-ODT) 4 MG disintegrating tablet Take 1 tablet (4 mg total) by mouth every 8 (eight) hours as needed for nausea or vomiting. 03/30/23   Rondel Baton, MD  oxyCODONE  (ROXICODONE) 5 MG immediate release tablet Take 1 tablet (5 mg total) by mouth every 6 (six) hours as needed. 06/07/23 06/06/24  Franky Macho, MD  pantoprazole (PROTONIX) 40 MG tablet TAKE 1 TABLET(40 MG) BY MOUTH DAILY 06/12/23   Donita Brooks, MD  prochlorperazine (COMPAZINE) 10 MG tablet Take 1 tablet (10 mg total) by mouth every 6 (six) hours as needed for nausea or vomiting. 06/14/23   [name], MD  sucralfate (CARAFATE) 1 GM/10ML suspension Take 10 mLs (1 g total) by mouth with breakfast, with lunch, and with evening meal. 10/20/22   Donita Brooks, MD  VITAMIN D PO Take 0.25 mcg by mouth daily. 02/02/23 02/01/24  [provider]      Allergies    Patient has no known allergies.    Review of Systems   Review of Systems  Constitutional:  Negative for fever.  Respiratory:  Negative for shortness of breath.   Cardiovascular:  Negative for chest pain.  Gastrointestinal:  Positive for diarrhea, nausea and vomiting. Negative for abdominal pain.  All other systems reviewed and are negative.   Physical Exam Updated Vital Signs BP (!) 155/80   Pulse 77   Temp 98.9 F (37.2 C) (Oral)   Resp 17   Ht 1.575 m (5\' 2" )   Wt 77.6 kg   SpO2 96%   BMI 31.28 kg/m  Physical Exam Vitals and nursing note reviewed.  Constitutional:      Appearance: She is well-developed.     Comments: Chronically ill-appearing but nontoxic  HENT:     Head: Normocephalic and atraumatic.     Nose: Nose normal.     Mouth/Throat:     Mouth: Mucous membranes are moist.  Eyes:     Pupils: Pupils are equal, round, and reactive to light.  Cardiovascular:     Rate and Rhythm: Normal rate and regular rhythm.     Heart sounds: Normal heart sounds.     Comments: Port right upper chest Pulmonary:     Effort: Pulmonary effort is normal. No respiratory distress.     Breath sounds: No wheezing.  Abdominal:     Palpations: Abdomen is soft.     Tenderness: There is abdominal tenderness.  There is no guarding or rebound.  Musculoskeletal:     Cervical back: Neck supple.  Skin:    General: Skin is warm and dry.  Neurological:     Mental Status: She is alert and oriented to person, place, and time.  Psychiatric:        Mood and Affect: Mood normal.     ED Results / Procedures / Treatments   Labs (all labs ordered are listed, but only abnormal results are displayed) Labs Reviewed  COMPREHENSIVE METABOLIC PANEL - Abnormal; Notable for the following components:      Result Value   Chloride 94 (*)    Glucose, Bld 108 (*)    BUN 44 (*)  Creatinine, Ser 10.04 (*)    Albumin 3.1 (*)    GFR, Estimated 4 (*)    All other components within normal limits  CBC - Abnormal; Notable for the following components:   WBC 2.4 (*)    RBC 2.70 (*)    Hemoglobin 9.1 (*)    HCT 28.1 (*)    MCV 104.1 (*)    All other components within normal limits  LIPASE, BLOOD    EKG None  Radiology No results found.  Procedures Procedures    Medications Ordered in ED Medications  loperamide (IMODIUM) capsule 4 mg (4 mg Oral Given 06/21/23 0013)  ondansetron (ZOFRAN) injection 4 mg (4 mg Intravenous Given 06/21/23 0019)  acetaminophen (TYLENOL) tablet 650 mg (650 mg Oral Given 06/21/23 0013)    ED Course/ Medical Decision Making/ A&P                                 Medical Decision Making Amount and/or Complexity of Data Reviewed Labs: ordered.  Risk OTC drugs. Prescription drug management.   This patient presents to the ED for concern of nausea, diarrhea, this involves an extensive number of treatment options, and is a complaint that carries with it a high risk of complications and morbidity.  I considered the following differential and admission for this acute, potentially life threatening condition.  The differential diagnosis includes side effects from recent chemotherapy, GI illness  MDM:    This is a 62 year old female who presents with nausea and diarrhea.  Onset  after starting chemotherapy last week.  She is nontoxic.  Vital signs are largely reassuring.  She has a benign exam.  Highly suspect her symptoms may be related to ongoing chemotherapy.  However will obtain lab work.  Patient was given IV Zofran.  Lab work is largely at the patient's baseline.  Creatinine is 10.  She has mild leukopenia and anemia.  LFTs are normal.  Negative lipase.  Patient had gradual improvement while in the emergency department.  We discussed ongoing supportive measures at home.  (Labs, imaging, consults)  Labs: I Ordered, and personally interpreted labs.  The pertinent results include: CBC, CMP, lipase  Imaging Studies ordered: I ordered imaging studies including none I independently visualized and interpreted imaging. I agree with the radiologist interpretation  Additional history obtained from chart review.  External records from outside source obtained and reviewed including prior evaluations and oncology notes  Cardiac Monitoring: The patient was maintained on a cardiac monitor.  If on the cardiac monitor, I personally viewed and interpreted the cardiac monitored which showed an underlying rhythm of: Sinus rhythm  Reevaluation: After the interventions noted above, I reevaluated the patient and found that they have :improved  Social Determinants of Health:  lives independently  Disposition: Discharge  Co morbidities that complicate the patient evaluation  Past Medical History:  Diagnosis Date   (HFpEF) heart failure with preserved ejection fraction (HCC)    a. 01/2019 Echo: EF 55-60%, mild conc LVH. DD.  Torn MV chordae.   Anemia    Atypical chest pain    a. 08/2018 MV: EF 59%, no ischemia; b. 02/2019 Cath: nonobs dzs.   Blood transfusion without reported diagnosis    Breast cancer (HCC) 10/12/2020   Cataract    ESRD (end stage renal disease) on dialysis [facility])    a. HD T, T, S   Essential hypertension, benign    GERD (gastroesophageal reflux  disease)     Headache    Hemorrhoids    Mixed hyperlipidemia    Morbid obesity (HCC)    Non-obstructive CAD (coronary artery disease)    a. 02/2019 CathL LM nl, LAD 33m, LCX nl, RCA 25p, EF 55-65%.   PONV (postoperative nausea and vomiting)    Renal insufficiency    S/P colonoscopy 08/2009   Dr. Elnoria Howard: sessile polyp (benign lymphoid), large hemorrhoids, repeat 5-10 years   Temporal arteritis (HCC)    Type 2 diabetes mellitus (HCC)    Wears glasses      Medicines Meds ordered this encounter  Medications   loperamide (IMODIUM) capsule 4 mg   ondansetron (ZOFRAN) injection 4 mg   acetaminophen (TYLENOL) tablet 650 mg   DISCONTD: acetaminophen (TYLENOL) 325 MG tablet    [place] E: cabinet override    I have reviewed the patients home medicines and have made adjustments as needed  Problem List / ED Course: Problem List Items Addressed This Visit   None Visit Diagnoses     Diarrhea, unspecified type    -  Primary                   Final Clinical Impression(s) / ED Diagnoses Final diagnoses:  Diarrhea, unspecified type    Rx / DC Orders ED Discharge Orders     None         Shon Baton, MD 06/22/23 [phone]

## 2023-06-21 NOTE — ED Notes (Signed)
Water given  

## 2023-06-21 NOTE — ED Notes (Signed)
Pt unable to void at this time. 

## 2023-06-22 DIAGNOSIS — E1129 Type 2 diabetes mellitus with other diabetic kidney complication: Secondary | ICD-10-CM | POA: Diagnosis not present

## 2023-06-22 DIAGNOSIS — N2581 Secondary hyperparathyroidism of renal origin: Secondary | ICD-10-CM | POA: Diagnosis not present

## 2023-06-22 DIAGNOSIS — D631 Anemia in chronic kidney disease: Secondary | ICD-10-CM | POA: Diagnosis not present

## 2023-06-22 DIAGNOSIS — N186 End stage renal disease: Secondary | ICD-10-CM | POA: Diagnosis not present

## 2023-06-22 DIAGNOSIS — Z992 Dependence on renal dialysis: Secondary | ICD-10-CM | POA: Diagnosis not present

## 2023-06-25 ENCOUNTER — Other Ambulatory Visit: Payer: Self-pay

## 2023-06-25 ENCOUNTER — Ambulatory Visit: Payer: Medicare Other | Admitting: Family Medicine

## 2023-06-25 ENCOUNTER — Emergency Department (HOSPITAL_COMMUNITY)
Admission: EM | Admit: 2023-06-25 | Discharge: 2023-06-25 | Disposition: A | Discharge status: Home / Self Care | Payer: Medicare Other | Attending: Emergency Medicine | Admitting: Emergency Medicine

## 2023-06-25 ENCOUNTER — Encounter (HOSPITAL_COMMUNITY): Payer: Self-pay

## 2023-06-25 DIAGNOSIS — N2581 Secondary hyperparathyroidism of renal origin: Secondary | ICD-10-CM | POA: Diagnosis not present

## 2023-06-25 DIAGNOSIS — R110 Nausea: Secondary | ICD-10-CM | POA: Insufficient documentation

## 2023-06-25 DIAGNOSIS — Z79899 Other long term (current) drug therapy: Secondary | ICD-10-CM | POA: Diagnosis not present

## 2023-06-25 DIAGNOSIS — E1129 Type 2 diabetes mellitus with other diabetic kidney complication: Secondary | ICD-10-CM | POA: Diagnosis not present

## 2023-06-25 DIAGNOSIS — N186 End stage renal disease: Secondary | ICD-10-CM | POA: Diagnosis not present

## 2023-06-25 DIAGNOSIS — D631 Anemia in chronic kidney disease: Secondary | ICD-10-CM | POA: Diagnosis not present

## 2023-06-25 DIAGNOSIS — U071 COVID-19: Secondary | ICD-10-CM | POA: Diagnosis not present

## 2023-06-25 DIAGNOSIS — Z992 Dependence on renal dialysis: Secondary | ICD-10-CM | POA: Insufficient documentation

## 2023-06-25 DIAGNOSIS — I509 Heart failure, unspecified: Secondary | ICD-10-CM | POA: Insufficient documentation

## 2023-06-25 DIAGNOSIS — Z853 Personal history of malignant neoplasm of breast: Secondary | ICD-10-CM | POA: Insufficient documentation

## 2023-06-25 DIAGNOSIS — Z7982 Long term (current) use of aspirin: Secondary | ICD-10-CM | POA: Diagnosis not present

## 2023-06-25 DIAGNOSIS — R1013 Epigastric pain: Secondary | ICD-10-CM | POA: Insufficient documentation

## 2023-06-25 LAB — CBC
HCT: 27.2 % — ABNORMAL LOW (ref 36.0–46.0)
Hemoglobin: 8.8 g/dL — ABNORMAL LOW (ref 12.0–15.0)
MCH: 33.5 pg (ref 26.0–34.0)
MCHC: 32.4 g/dL (ref 30.0–36.0)
MCV: 103.4 fL — ABNORMAL HIGH (ref 80.0–100.0)
Platelets: 245 10*3/uL (ref 150–400)
RBC: 2.63 MIL/uL — ABNORMAL LOW (ref 3.87–5.11)
RDW: 14.4 % (ref 11.5–15.5)
WBC: 6.6 10*3/uL (ref 4.0–10.5)
nRBC: 0.3 % — ABNORMAL HIGH (ref 0.0–0.2)

## 2023-06-25 LAB — COMPREHENSIVE METABOLIC PANEL
ALT: 18 U/L (ref 0–44)
AST: 19 U/L (ref 15–41)
Albumin: 3.2 g/dL — ABNORMAL LOW (ref 3.5–5.0)
Alkaline Phosphatase: 98 U/L (ref 38–126)
Anion gap: 9 (ref 5–15)
BUN: 18 mg/dL (ref 8–23)
CO2: 32 mmol/L (ref 22–32)
Calcium: 9 mg/dL (ref 8.9–10.3)
Chloride: 94 mmol/L — ABNORMAL LOW (ref 98–111)
Creatinine, Ser: 7.7 mg/dL — ABNORMAL HIGH (ref 0.44–1.00)
GFR, Estimated: 6 mL/min — ABNORMAL LOW (ref >60)
Glucose, Bld: 96 mg/dL (ref 70–99)
Potassium: 3.2 mmol/L — ABNORMAL LOW (ref 3.5–5.1)
Sodium: 135 mmol/L (ref 135–145)
Total Bilirubin: 0.4 mg/dL (ref <1.2)
Total Protein: 6.8 g/dL (ref 6.5–8.1)

## 2023-06-25 LAB — LIPASE, BLOOD: Lipase: 41 U/L (ref 11–51)

## 2023-06-25 MED ORDER — LOPERAMIDE HCL 2 MG PO CAPS
2.0000 mg | ORAL_CAPSULE | Freq: Once | ORAL | Status: AC
Start: 2023-06-25 — End: 2023-06-25
  Administered 2023-06-25: 2 mg via ORAL
  Filled 2023-06-25: qty 1

## 2023-06-25 MED ORDER — ONDANSETRON HCL 4 MG/2ML IJ SOLN
4.0000 mg | Freq: Once | INTRAMUSCULAR | Status: DC
  Filled 2023-06-25: qty 2

## 2023-06-25 MED ORDER — ONDANSETRON 4 MG PO TBDP
4.0000 mg | ORAL_TABLET | Freq: Once | ORAL | Status: AC
  Administered 2023-06-25: 4 mg via ORAL
  Filled 2023-06-25: qty 1

## 2023-06-25 MED ORDER — MECLIZINE HCL 12.5 MG PO TABS
25.0000 mg | ORAL_TABLET | Freq: Once | ORAL | Status: AC
  Administered 2023-06-25: 25 mg via ORAL
  Filled 2023-06-25: qty 2

## 2023-06-25 NOTE — ED Notes (Addendum)
Pt attempted to urinate for urine sample, However, she states she does not make a lot of urine due to being on dialysis, only a couple of drops of urine came out, not enough to obtain a urine sample--PA-C made aware

## 2023-06-25 NOTE — ED Triage Notes (Signed)
Pt to er, pt states that she is here for the same thing as Wednesday, states that she has upper abd pain.  States that she isn't eating, states that she will have a couple bites of food and then feels sick to her stomach.  Pt states that she is [facility] dialysis and had dialysis today.  States that she had a normal run at dialysis, but feels generally poorly.

## 2023-06-25 NOTE — ED Provider Notes (Signed)
[place] EMERGENCY DEPARTMENT AT [facility] Provider Note   CSN: [id number] Arrival date & time: 06/25/23  1414     History {Add pertinent medical, surgical, social history, OB history to HPI:1} Chief Complaint  Patient presents with   Abdominal Pain    Megan [name] is a 62 y.o. female with a history of breast cancer, end-stage renal disease on dialysis, and congestive heart failure presents the ED today for abdominal pain.  Patient reports intermittent epigastric pain, generalized weakness, vertigo, nausea, and diarrhea for the past week.  Patient was seen about 5 days ago for the same complaint and it was believed that her symptoms could be due to recently starting chemotherapy.  She denies fevers, vomiting, or difficulty with urination.  She states that she was recently started on chemotherapy and her symptoms started after that.  She was seen about 5 days ago for the same complaint and they also believe that chemotherapy could have been the cause of her symptoms.       Home Medications Prior to Admission medications   Medication Sig Start Date End Date Taking? Authorizing Provider  acetaminophen (TYLENOL) 500 MG tablet Take 500-1,000 mg by mouth every 6 (six) hours as needed (pain.).    [provider]  amLODipine (NORVASC) 10 MG tablet TAKE 1 TABLET BY MOUTH AT BEDTIME 08/15/22   Donita Brooks, MD  anastrozole (ARIMIDEX) 1 MG tablet TAKE 1 TABLET BY MOUTH DAILY 01/16/23   [name], MD  aspirin EC 81 MG tablet Take 81 mg by mouth in the morning.    [provider]  atorvastatin (LIPITOR) 20 MG tablet TAKE 1 TABLET(20 MG) BY MOUTH DAILY 03/15/23   Donita Brooks, MD  B Complex-C-Zn-Folic Acid (DIALYVITE 800-ZINC 15) 0.8 MG TABS Take 1 tablet by mouth in the morning. 06/19/22   [provider]  cinacalcet (SENSIPAR) 30 MG tablet Take 2 tablets (60 mg total) by mouth daily. Patient taking differently: Take 90 mg by mouth  daily with breakfast. With a meal 06/13/18   Donita Brooks, MD  fluticasone [facility]) 50 MCG/ACT nasal spray Place 2 sprays into both nostrils daily. Patient taking differently: Place 2 sprays into both nostrils daily as needed for allergies or rhinitis. 10/20/22   Donita Brooks, MD  gabapentin (NEURONTIN) 300 MG capsule TAKE 1 CAPSULE(300 MG) BY MOUTH AT BEDTIME 06/12/23   Donita Brooks, MD  iron sucrose (VENOFER) 20 MG/ML injection Inject 50 mg into the vein once a week. 02/19/23 02/18/24  [provider]  isosorbide mononitrate (IMDUR) 30 MG 24 hr tablet Take 1 tablet (30 mg total) by mouth 2 (two) times daily. Patient taking differently: Take 30 mg by mouth daily. 04/17/23 07/16/23  Furth, Cadence H, PA-C  lanthanum (FOSRENOL) 1000 MG chewable tablet Chew 2,000-3,000 mg by mouth See admin instructions. Take 3 tablets (3000 mg) by mouth with meals and take 2 tablets (2000 mg) with snacks 10/16/19   [provider]  lidocaine-prilocaine (EMLA) cream Apply 1 application  topically every Monday, Wednesday, and Friday with hemodialysis. 09/24/18   [provider]  lidocaine-prilocaine (EMLA) cream Apply to affected area once 06/14/23   [name], MD  meclizine (ANTIVERT) 25 MG tablet Take 1 tablet (25 mg total) by mouth 3 (three) times daily as needed for dizziness. 06/12/23   Donita Brooks, MD  nebivolol (BYSTOLIC) 10 MG tablet TAKE 1 TABLET BY MOUTH IN THE MORNING AND AT BEDTIME 04/24/23   Pickard,  Priscille Heidelberg, MD  nitroGLYCERIN (NITROSTAT) 0.4 MG SL tablet Place 1 tablet (0.4 mg total) under the tongue every 5 (five) minutes x 3 doses as needed for chest pain (if no relief after 3rd dose, proceed to ED or call 911). 09/13/22   Antoine Poche, MD  ondansetron (ZOFRAN) 4 MG tablet Take 1 tablet (4 mg total) by mouth every 8 (eight) hours as needed for nausea or vomiting. 11/09/22   Park Meo, FNP  ondansetron (ZOFRAN-ODT) 4 MG disintegrating tablet Take 1  tablet (4 mg total) by mouth every 8 (eight) hours as needed for nausea or vomiting. 03/30/23   Rondel Baton, MD  oxyCODONE (ROXICODONE) 5 MG immediate release tablet Take 1 tablet (5 mg total) by mouth every 6 (six) hours as needed. 06/07/23 06/06/24  Franky Macho, MD  pantoprazole (PROTONIX) 40 MG tablet TAKE 1 TABLET(40 MG) BY MOUTH DAILY 06/12/23   Donita Brooks, MD  prochlorperazine (COMPAZINE) 10 MG tablet Take 1 tablet (10 mg total) by mouth every 6 (six) hours as needed for nausea or vomiting. 06/14/23   [name], MD  sucralfate (CARAFATE) 1 GM/10ML suspension Take 10 mLs (1 g total) by mouth with breakfast, with lunch, and with evening meal. 10/20/22   Donita Brooks, MD  VITAMIN D PO Take 0.25 mcg by mouth daily. 02/02/23 02/01/24  [provider]      Allergies    Motrin [ibuprofen]    Review of Systems   Review of Systems  Gastrointestinal:  Positive for abdominal pain.  All other systems reviewed and are negative.   Physical Exam Updated Vital Signs BP (!) 144/75   Pulse 78   Temp 98.8 F (37.1 C) (Oral)   Resp (!) 21   Ht 5\' 2"  (1.575 m)   Wt 77.1 kg   SpO2 99%   BMI 31.09 kg/m  Physical Exam Vitals and nursing note reviewed.  Constitutional:      General: She is not in acute distress.    Appearance: Normal appearance.  HENT:     Head: Normocephalic and atraumatic.     Mouth/Throat:     Mouth: Mucous membranes are moist.  Eyes:     Conjunctiva/sclera: Conjunctivae normal.     Pupils: Pupils are equal, round, and reactive to light.  Cardiovascular:     Rate and Rhythm: Normal rate and regular rhythm.     Pulses: Normal pulses.     Heart sounds: Normal heart sounds.  Pulmonary:     Effort: Pulmonary effort is normal.     Breath sounds: Normal breath sounds.  Abdominal:     General: There is no distension.     Palpations: Abdomen is soft.     Tenderness: There is abdominal tenderness. There is no guarding or rebound.      Comments: Epigastric tenderness without guarding or rebound  Skin:    General: Skin is warm and dry.     Findings: No rash.  Neurological:     General: No focal deficit present.     Mental Status: She is alert.     Sensory: No sensory deficit.     Motor: No weakness.  Psychiatric:        Mood and Affect: Mood normal.        Behavior: Behavior normal.    ED Results / Procedures / Treatments   Labs (all labs ordered are listed, but only abnormal results are displayed) Labs Reviewed  COMPREHENSIVE METABOLIC PANEL - Abnormal;  Notable for the following components:      Result Value   Potassium 3.2 (*)    Chloride 94 (*)    Creatinine, Ser 7.70 (*)    Albumin 3.2 (*)    GFR, Estimated 6 (*)    All other components within normal limits  CBC - Abnormal; Notable for the following components:   RBC 2.63 (*)    Hemoglobin 8.8 (*)    HCT 27.2 (*)    MCV 103.4 (*)    nRBC 0.3 (*)    All other components within normal limits  LIPASE, BLOOD  URINALYSIS, ROUTINE W REFLEX MICROSCOPIC    EKG None  Radiology No results found.  Procedures Procedures: not indicated. {Document cardiac monitor, telemetry assessment procedure when appropriate:1}  Medications Ordered in ED Medications - No data to display  ED Course/ Medical Decision Making/ A&P   {   Click here for ABCD2, HEART and other calculatorsREFRESH Note before signing :1}                              Medical Decision Making Amount and/or Complexity of Data Reviewed Labs: ordered.   This patient presents to the ED for concern of epigastric pain, this involves an extensive number of treatment options, and is a complaint that carries with it a high risk of complications and morbidity.   Differential diagnosis includes: ***   Comorbidities  See HPI above   Additional History  Additional history obtained from prior ED and oncology records.   Cardiac Monitoring / EKG  The patient was maintained on a cardiac  monitor.  I personally viewed and interpreted the cardiac monitored which showed: *** with a heart rate of *** bpm.   Lab Tests  I ordered and personally interpreted labs.  The pertinent results include:  ***   Imaging Studies  I ordered imaging studies including ***  I independently visualized and interpreted imaging which showed: *** I agree with the radiologist interpretation   Consultations  I requested consultation with ***,  and discussed lab and imaging findings as well as pertinent plan - they recommend: ***   Problem List / ED Course / Critical Interventions / Medication Management  Epigastric pain and nausea  I ordered medications including: *** for ***  Reevaluation of the patient after these medicines showed that the patient {resolved/improved/worsened:23923::"improved"} I have reviewed the patients home medicines and have made adjustments as needed   Social Determinants of Health  ***   Test / Admission - Considered  ***   {Document critical care time when appropriate:1} {Document review of labs and clinical decision tools ie heart score, Chads2Vasc2 etc:1}  {Document your independent review of radiology images, and any outside records:1} {Document your discussion with family members, caretakers, and with consultants:1} {Document social determinants of health affecting pt's care:1} {Document your decision making why or why not admission, treatments were needed:1} Final Clinical Impression(s) / ED Diagnoses Final diagnoses:  None    Rx / DC Orders ED Discharge Orders     None

## 2023-06-25 NOTE — Discharge Instructions (Addendum)
As discussed, your labs are reassuring. Take Meclizine as needed for dizziness and Prochlorperazine as needed for nausea. Follow up with your oncologist on Thursday as scheduled, for reevaluation of your symptoms.  Get help right away if: You have pain in your chest, neck, arm, or jaw. You feel very weak or you faint. You have vomit that is bright red or looks like coffee grounds. You have bloody or black poop (stools) or poop that looks like tar. You have a very bad headache, a stiff neck, or both. You have very bad pain, cramping, or bloating in your belly (abdomen). You have trouble breathing or you are breathing very quickly. Your heart is beating very quickly. Your skin feels cold and clammy. You feel confused. You have signs of losing too much water in your body, such as: Dark pee, very little pee, or no pee. Cracked lips. Dry mouth. Sunken eyes. Sleepiness. Weakness.

## 2023-06-27 DIAGNOSIS — N186 End stage renal disease: Secondary | ICD-10-CM | POA: Diagnosis not present

## 2023-06-27 DIAGNOSIS — D631 Anemia in chronic kidney disease: Secondary | ICD-10-CM | POA: Diagnosis not present

## 2023-06-27 DIAGNOSIS — N2581 Secondary hyperparathyroidism of renal origin: Secondary | ICD-10-CM | POA: Diagnosis not present

## 2023-06-27 DIAGNOSIS — E1129 Type 2 diabetes mellitus with other diabetic kidney complication: Secondary | ICD-10-CM | POA: Diagnosis not present

## 2023-06-27 DIAGNOSIS — Z992 Dependence on renal dialysis: Secondary | ICD-10-CM | POA: Diagnosis not present

## 2023-06-27 NOTE — Progress Notes (Signed)
[facility] [address], [place] [address]    Clinic Day:  06/28/23   Referring physician: Donita Brooks, MD  Patient Care Team: Donita Brooks, MD as PCP - General (Family Medicine) [name] [name], MD as PCP - Cardiology (Cardiology) [name], MD (Gastroenterology) [name], MD as Consulting Physician (Cardiology) [name], MD as Medical Oncologist (Medical Oncology) [name], MD as Consulting Physician (Nephrology)   ASSESSMENT & PLAN:   Assessment: 1.  Stage Ib (T1CN0) right breast cancer, ER positive, PR and HER-2 negative: -Right lumpectomy and sentinel lymph node biopsy on 09/18/2018 shows 1.5 cm IDC, grade 3, associated with high-grade DCIS, negative margins, 0/3 lymph nodes positive, ER 50%, PR negative and HER-2 negative, Ki-67 60%. -Oncotype DX recurrence score 47.  Distant recurrence at 9 years with tamoxifen alone is 36%.  Absolute chemotherapy benefit is more than 15%. -Adjuvant chemotherapy with 4 cycles of AC from 10/30/2018 through 01/01/2019. -She was started on anastrozole in June 2020. -Mammogram on 09/02/2019 showed calcifications in the posterior aspect of upper outer quadrant of the right breast. -Right partial mastectomy on 02/18/2020 shows high-grade DCIS, 1.5 cm, no invasive carcinoma, resection margins negative.  0/11 lymph nodes.  ER 30% positive, PR negative. -Left simple mastectomy on 06/15/2020 was benign.  Right breast biopsy shows microscopic focus of invasive ductal carcinoma in the background of extensive high-grade DCIS. -Right breast lumpectomy on 07/13/2020 shows multifocal invasive ductal carcinoma, grade 3, largest measuring 1.2 cm.  Extensive high-grade DCIS with necrosis.  Margins are free.  PT1CNX.  There are multiple foci of invasive carcinoma arising from extensive DCIS.  Several small foci of invasive tumor arising from DCIS indicating new primaries.  ER/PR/HER-2 is negative.   Ki-67 is 20%. -PET scan on 09/14/2020 shows areas of nodularity with discrete nodule in the fat of the inferior chest wall within the subcutaneous tissues. -Ultrasound of the right chest wall shows masslike abnormality in the upper inner aspect of the right anterior chest at 1 o'clock position measuring 4.1 cm in greatest dimension.  1.7 cm hypoechoic mass in the central anterior right chest concerning for malignancy.  Possible borderline enlarged residual right axillary lymph node. -Right breast biopsy on 10/12/2020 shows 1.3 cm invasive ductal carcinoma, focally 0.1 cm from superior margin.  DCIS is less than 0.1 cm from superior margin.  PT1CPNX. - Right mastectomy followed by radiation therapy completed in May 2022. - PET scan (11/30/2022): Recurrent/metastatic disease within the right medial pectoralis musculature. - Biopsy (12/19/2022): Metastatic breast adenocarcinoma.  ER positive, 40%, weak staining.  PR negative.  Ki-67 30%.  HER2 IHC 0. - Right chest wall mass resection on 01/18/2023.  Pathology shows 1.7 cm IDC, grade 3.  DCIS with high-grade with necrosis.  Carcinoma involves inked inferior margin with focally invading into atrophic skeletal muscle. - Her case was discussed at multidisciplinary clinic at [facility] on 04/18/2023. - Surgical oncology felt that resection margins are adequate and recommended radiation therapy.  She was also evaluated by medical oncology (Dr. Iona Hansen) who has recommended systemic therapy with 6 cycles of docetaxel, carboplatin and pembrolizumab. - XRT from 05/07/2023 through 05/25/2023. - Cycle 1 carboplatin, docetaxel and pembrolizumab on 06/14/2023  2.  Osteopenia: -Bone density on 01/20/2019 shows T score -1.9. - DEXA scan (03/24/2021) T score -2.4    Plan: 1.  Recurrent right breast cancer, ER 40%, weak staining, PR negative, HER2 negative: - She received cycle 1 of chemotherapy  on 06/14/2023. - She went to the ER with diarrhea, cramping and vomiting.  After  that she got tested positive for COVID.  She reportedly had 1 episode of passing out during dialysis. - She reported decrease in taste, hair loss and vomited x 2 after cycle 1 of chemotherapy. - She reported right chest wall pain is better.  It is not constant. - Reviewed labs today: White count 4.8, hemoglobin 8.0 and platelet count normal.  ANC was normal.  LFTs were normal. - She will come back for cycle 2 on 07/05/2023 without any dose changes. - RTC prior to cycle 3.   2.  ESRD on HD: - Continue HD on Monday/Wednesday/Friday under the direction of Dr. Arrie Aran.   3.  Neuropathy: - Neuropathy in the feet has been stable.  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  Continue gabapentin at bedtime.  Closely monitor.   4.  Right chest wall pain: - Continue oxycodone 5 mg daily as needed.     No orders of the defined types were placed in this encounter.     [name],acting as a Neurosurgeon for [name], MD.,have documented all relevant documentation on the behalf of [name], MD,as directed by  [name], MD while in the presence of [name], MD.  I, [name] MD, have reviewed the above documentation for accuracy and completeness, and I agree with the above.      [name], MD   11/14/20245:42 PM  CHIEF COMPLAINT:   Diagnosis: recurrent right breast cancer    Cancer Staging  Malignant neoplasm of upper-inner quadrant of right female breast [facility]) Staging form: Breast, AJCC 8th Edition - Clinical stage from 09/26/2018: Stage IB (cT1c, cN0, cM0, G3, ER+, PR-, HER2-) - Signed by [name], MD on 09/26/2018    Prior Therapy: 1. Right lumpectomy and sentinel lymph node biopsy on 09/18/2018  2. Adjuvant chemotherapy with 4 cycles of AC from 10/30/2018 through 01/01/2019  3. Right partial mastectomy on 02/18/2020  4. Right breast lumpectomy on 07/13/2020  5.  Right mastectomy followed by radiation therapy completed in May 2022 6.  Anastrozole started in June 2020  Current Therapy: Carboplatin, docetaxel, pembrolizumab   HISTORY OF PRESENT ILLNESS:   Oncology History  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)  09/18/2018 Initial Diagnosis   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)   09/26/2018 Cancer Staging   Staging form: Breast, AJCC 8th Edition - Clinical stage from 09/26/2018: Stage IB (cT1c, cN0, cM0, G3, ER+, PR-, HER2-) - Signed by [name], MD on 09/26/2018   10/30/2018 - 01/01/2019 Chemotherapy   Patient is on Treatment Plan : BREAST Adjuvant AC q21d     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 female presenting to clinic today for follow up of recurrent right breast cancer. She was last seen by me on 06/14/23.  Since her last visit, she was seen in the ED on 06/20/23 for diarrhea, treated with Imodium and IV Zofran . Shd was seen in the ED again on 06/25/23 for epigastric pain and nausea, treated with Zofran. She  then developed diarrhea and was given Imodium.  Today, she states that she is doing well overall. Her appetite level is at 75%. Her energy level is at 60%. She is accompanied by her husband and a family member.   She reports she had diarrhea with hematochezia, nausea, and cramping before and during her last 2 ED visits. She had 2 episodes of vomiting on 06/23/23. She notes she tested positive for Covid on 06/20/23 at dialysis, and is unsure if symptoms are due to Covid or chemotherapy. She lost her taste buds and states her taste is slowly returning. She tested negative for Covid yesterday.   Her  blood pressure prior to dialysis is low in the 99-103 diastolic range. She had one episode of losing consciousness while at dialysis due to hypotension.   Most severe symptom was loss of taste. Her BM's have improved and she denies any skin rashes. Her neuropathy is stable in the bilateral feet and left handed-fingers. She reports she lost her hair this morning.   Her right chest pain has improved to an occasional sharp pain.   PAST MEDICAL HISTORY:   Past Medical History: Past Medical History:  Diagnosis Date   (HFpEF) heart failure with preserved ejection fraction (HCC)    a. 01/2019 Echo: EF 55-60%, mild conc LVH. DD.  Torn MV chordae.   Anemia    Atypical chest pain    a. 08/2018 MV: EF 59%, no ischemia; b. 02/2019 Cath: nonobs dzs.   Blood transfusion without reported diagnosis    Breast cancer (HCC) 10/12/2020   Cataract    ESRD (end stage renal disease) on dialysis [facility])    a. HD T, T, S   Essential hypertension, benign    GERD (gastroesophageal reflux disease)    Headache    Hemorrhoids    Mixed hyperlipidemia    Morbid obesity (HCC)    Non-obstructive CAD (coronary artery disease)    a. 02/2019 CathL LM nl, LAD 92m, LCX nl, RCA 25p, EF 55-65%.   PONV (postoperative nausea and vomiting)    Renal insufficiency    S/P colonoscopy 08/2009   Dr. Elnoria Howard: sessile polyp (benign lymphoid), large hemorrhoids, repeat 5-10 years   Temporal arteritis (HCC)    Type 2 diabetes mellitus (HCC)    Wears glasses     Surgical History: Past Surgical History:  Procedure Laterality Date   ABDOMINAL HYSTERECTOMY     APPENDECTOMY     ARTERY BIOPSY N/A 05/09/2018   Procedure: RIGHT TEMPORAL ARTERY BIOPSY;  Surgeon: Jimmye Norman, MD;  Location: [facility] OR;  Service: General;  Laterality: N/A;   BREAST BIOPSY Right 06/15/2020   Procedure: RIGHT BREAST BIOPSY;  Surgeon: Franky Macho, MD;  Location: [facility] ORS;  Service: General;  Laterality: Right;   CATARACT EXTRACTION W/PHACO Left 02/09/2017    Procedure: CATARACT EXTRACTION PHACO AND INTRAOCULAR LENS PLACEMENT LEFT EYE;  Surgeon: Gemma Payor, MD;  Location: [facility] ORS;  Service: Ophthalmology;  Laterality: Left;  CDE: 4.89   CATARACT EXTRACTION W/PHACO Right 06/04/2017   Procedure: CATARACT EXTRACTION PHACO AND INTRAOCULAR LENS PLACEMENT (IOC);  Surgeon: Gemma Payor, MD;  Location: [facility] ORS;  Service: Ophthalmology;  Laterality: Right;  CDE: 4.12   CHOLECYSTECTOMY  09/29/2011   Procedure: LAPAROSCOPIC CHOLECYSTECTOMY;  Surgeon: Dalia Heading, MD;  Location: [facility] ORS;  Service: General;  Laterality: N/A;   COLONOSCOPY  08/2009   Dr. Elnoria Howard: sessile polyp (benign lymphoid), large hemorrhoids, repeat 5-10 years   COLONOSCOPY N/A 06/12/2016   prominent hemorrhoids  COLONOSCOPY WITH PROPOFOL N/A 06/16/2021   Procedure: COLONOSCOPY WITH PROPOFOL;  Surgeon: Corbin Ade, MD;  Location: [facility];  Service: Endoscopy;  Laterality: N/A;  9:30am (dialysis pt)   ESOPHAGOGASTRODUODENOSCOPY  09/05/2011   [id number] hiatal hernia; remainder of exam normal. No explanation for patient's abdominal pain with today's examination   ESOPHAGOGASTRODUODENOSCOPY N/A 12/17/2013   Dr. Jena Gauss: gastric erythema, erosion, mild chronic inflammation on path    ESOPHAGOGASTRODUODENOSCOPY (EGD) WITH PROPOFOL N/A 06/16/2021   Procedure: ESOPHAGOGASTRODUODENOSCOPY (EGD) WITH PROPOFOL;  Surgeon: Corbin Ade, MD;  Location: [facility];  Service: Endoscopy;  Laterality: N/A;   EXCISION OF BREAST BIOPSY Right 10/12/2020   Procedure: EXCISION OF RIGHT BREAST BIOPSY;  Surgeon: Franky Macho, MD;  Location: [facility];  Service: General;  Laterality: Right;   LAPAROSCOPIC APPENDECTOMY  09/29/2011   Procedure: APPENDECTOMY LAPAROSCOPIC;  Surgeon: Dalia Heading, MD;  Location: [facility];  Service: General;;  incidental appendectomy   LEFT HEART CATH AND CORONARY ANGIOGRAPHY N/A 02/28/2019   Procedure: LEFT HEART CATH AND CORONARY ANGIOGRAPHY;  Surgeon: Corky Crafts, MD;   Location: [facility] INVASIVE CV LAB;  Service: Cardiovascular;  Laterality: N/A;   MASS EXCISION Right 01/18/2023   Procedure: EXCISION MASS, RIGHT CHEST S/P MASTECTOMY;  Surgeon: Franky Macho, MD;  Location: [facility];  Service: General;  Laterality: Right;   MASTECTOMY MODIFIED RADICAL Right 02/18/2020   Procedure: MASTECTOMY MODIFIED RADICAL;  Surgeon: Franky Macho, MD;  Location: [facility];  Service: General;  Laterality: Right;   MASTECTOMY, PARTIAL Right 07/13/2020   Procedure: RIGHT PARTIAL MASTECTOMY;  Surgeon: Franky Macho, MD;  Location: [facility];  Service: General;  Laterality: Right;   PARTIAL MASTECTOMY WITH NEEDLE LOCALIZATION AND AXILLARY SENTINEL LYMPH NODE BX Right 09/18/2018   Procedure: RIGHT PARTIAL MASTECTOMY AFTER NEEDLE LOCALIZATION, SENTINEL LYMPH NODE BIOPSY RIGHT AXILLA;  Surgeon: Franky Macho, MD;  Location: [facility];  Service: General;  Laterality: Right;   POLYPECTOMY  06/16/2021   Procedure: POLYPECTOMY;  Surgeon: Corbin Ade, MD;  [facility];  Service: Endoscopy;;   PORTACATH PLACEMENT Right 06/07/2023   Procedure: INSERTION PORT-A-CATH, RIGHT  (DIALYSIS ACCESS ON LEFT);  Surgeon: Franky Macho, MD;  Location: [facility];  Service: General;  Laterality: Right;   SIMPLE MASTECTOMY WITH AXILLARY SENTINEL NODE BIOPSY Left 06/15/2020   Procedure: LEFT SIMPLE MASTECTOMY;  Surgeon: Franky Macho, MD;  Location: [facility];  Service: General;  Laterality: Left;    Social History: Social History   Socioeconomic History   Marital status: Married    Spouse name: Not on file   Number of children: Not on file   Years of education: Not on file   Highest education level: 12th grade  Occupational History   Occupation: Systems developer: [employer]  Tobacco Use   Smoking status: Never    Passive exposure: Never   Smokeless tobacco: Never  Vaping Use   Vaping status: Never Used  Substance and Sexual Activity   Alcohol use: No   Drug use: No   Sexual activity:  Yes    Birth control/protection: Surgical  Other Topics Concern   Not on file  Social History Narrative   Works at [employer] in [place].    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   Also has to get items from high shelves-causes achy pain in shoulder area      Married.   Children are grown, out of house.  Social Determinants of Health   Financial Resource Strain: Low Risk  (06/08/2023)   Overall Financial Resource Strain (CARDIA)    Difficulty of Paying Living Expenses: Not hard at all  Food Insecurity: No Food Insecurity (06/08/2023)   Hunger Vital Sign    Worried About Running Out of Food in the Last Year: Never true    Ran Out of Food in the Last Year: Never true  Transportation Needs: No Transportation Needs (06/08/2023)   PRAPARE - Administrator, Civil Service (Medical): No    Lack of Transportation (Non-Medical): No  Physical Activity: Inactive (06/08/2023)   Exercise Vital Sign    Days of Exercise per Week: 0 days    Minutes of Exercise per Session: 0 min  Stress: No Stress Concern Present (06/08/2023)   [employer] of Occupational Health - Occupational Stress Questionnaire    Feeling of Stress : Not at all  Social Connections: Socially Integrated (06/08/2023)   Social Connection and Isolation Panel [NHANES]    Frequency of Communication with Friends and Family: More than three times a week    Frequency of Social Gatherings with Friends and Family: Once a week    Attends Religious Services: More than 4 times per year    Active Member of [employer] or Organizations: No    Attends Engineer, structural: More than 4 times per year    Marital Status: Married  Catering manager Violence: Not At Risk (09/14/2022)   Humiliation, Afraid, Rape, and Kick questionnaire    Fear of Current or Ex-Partner: No    Emotionally Abused: No    Physically Abused: No    Sexually Abused: No    Family History: Family History  Problem Relation Age of  Onset   Hypertension Mother    Coronary artery disease Mother    Diabetes Mother    Hypertension Sister    Coronary artery disease Sister    Hypertension Brother    Heart attack Father    Hypertension Son    Heart attack Maternal Aunt    Hypertension Maternal Aunt    Diabetes Maternal Aunt    Heart attack Maternal Uncle    Hypertension Maternal Uncle    Diabetes Maternal Uncle    Heart attack Paternal Aunt    Hypertension Paternal Aunt    Diabetes Paternal Aunt    Heart attack Paternal Uncle    Hypertension Paternal Uncle    Diabetes Paternal Uncle    Heart attack Maternal Grandmother    Heart attack Maternal Grandfather    Heart attack Paternal Grandmother    Heart attack Paternal Grandfather    Colon cancer Neg Hx     Current Medications:  Current Outpatient Medications:    acetaminophen (TYLENOL) 500 MG tablet, Take 500-1,000 mg by mouth every 6 (six) hours as needed (pain.)., Disp: , Rfl:    amLODipine (NORVASC) 10 MG tablet, TAKE 1 TABLET BY MOUTH AT BEDTIME, Disp: 90 tablet, Rfl: 3   anastrozole (ARIMIDEX) 1 MG tablet, TAKE 1 TABLET BY MOUTH DAILY, Disp: 90 tablet, Rfl: 3   aspirin EC 81 MG tablet, Take 81 mg by mouth in the morning., Disp: , Rfl:    atorvastatin (LIPITOR) 20 MG tablet, TAKE 1 TABLET(20 MG) BY MOUTH DAILY, Disp: 90 tablet, Rfl: 0   B Complex-C-Zn-Folic Acid (DIALYVITE 800-ZINC 15) 0.8 MG TABS, Take 1 tablet by mouth in the morning., Disp: , Rfl:    cinacalcet (SENSIPAR) 30 MG tablet, Take 2 tablets (60 mg  total) by mouth daily. (Patient taking differently: Take 90 mg by mouth daily with breakfast. With a meal), Disp: 90 tablet, Rfl: 0   fluticasone (FLONASE) 50 MCG/ACT nasal spray, Place 2 sprays into both nostrils daily., Disp: 16 g, Rfl: 6   gabapentin (NEURONTIN) 300 MG capsule, TAKE 1 CAPSULE(300 MG) BY MOUTH AT BEDTIME, Disp: 90 capsule, Rfl: 3   iron sucrose (VENOFER) 20 MG/ML injection, Inject 50 mg into the vein once a week., Disp: , Rfl:     isosorbide mononitrate (IMDUR) 30 MG 24 hr tablet, Take 1 tablet (30 mg total) by mouth 2 (two) times daily. (Patient taking differently: Take 30 mg by mouth daily.), Disp: 180 tablet, Rfl: 3   lanthanum (FOSRENOL) 1000 MG chewable tablet, Chew 2,000-3,000 mg by mouth See admin instructions. Take 3 tablets (3000 mg) by mouth with meals and take 2 tablets (2000 mg) with snacks, Disp: , Rfl:    lidocaine-prilocaine (EMLA) cream, Apply 1 application  topically every Monday, Wednesday, and Friday with hemodialysis., Disp: , Rfl:    meclizine (ANTIVERT) 25 MG tablet, Take 1 tablet (25 mg total) by mouth 3 (three) times daily as needed for dizziness., Disp: 30 tablet, Rfl: 1   nebivolol (BYSTOLIC) 10 MG tablet, TAKE 1 TABLET BY MOUTH IN THE MORNING AND AT BEDTIME, Disp: 180 tablet, Rfl: 0   nitroGLYCERIN (NITROSTAT) 0.4 MG SL tablet, Place 1 tablet (0.4 mg total) under the tongue every 5 (five) minutes x 3 doses as needed for chest pain (if no relief after 3rd dose, proceed to ED or call 911)., Disp: 25 tablet, Rfl: 3   oxyCODONE (ROXICODONE) 5 MG immediate release tablet, Take 1 tablet (5 mg total) by mouth every 6 (six) hours as needed., Disp: 30 tablet, Rfl: 0   pantoprazole (PROTONIX) 40 MG tablet, TAKE 1 TABLET(40 MG) BY MOUTH DAILY, Disp: 90 tablet, Rfl: 3   prochlorperazine (COMPAZINE) 10 MG tablet, Take 1 tablet (10 mg total) by mouth every 6 (six) hours as needed for nausea or vomiting., Disp: 60 tablet, Rfl: 3   VITAMIN D PO, Take 0.25 mcg by mouth daily., Disp: , Rfl:    Allergies: Allergies  Allergen Reactions   Motrin [Ibuprofen] Other (See Comments)    esrd    REVIEW OF SYSTEMS:   Review of Systems  Constitutional:  Negative for chills, fatigue and fever.  HENT:   Negative for lump/mass, mouth sores, nosebleeds, sore throat and trouble swallowing.   Eyes:  Negative for eye problems.  Respiratory:  Positive for cough and shortness of breath.   Cardiovascular:  Positive for chest pain  (right side, 4/10 severity). Negative for leg swelling and palpitations.  Gastrointestinal:  Positive for nausea. Negative for abdominal pain, constipation, diarrhea and vomiting.  Genitourinary:  Negative for bladder incontinence, difficulty urinating, dysuria, frequency, hematuria and nocturia.   Musculoskeletal:  Negative for arthralgias, back pain, flank pain, myalgias and neck pain.  Skin:  Negative for itching and rash.  Neurological:  Positive for dizziness. Negative for headaches and numbness.       +tingling hands and feet  Hematological:  Does not bruise/bleed easily.  Psychiatric/Behavioral:  Negative for depression, sleep disturbance and suicidal ideas. The patient is not nervous/anxious.   All other systems reviewed and are negative.    VITALS:   There were no vitals taken for this visit.  Wt Readings from Last 3 Encounters:  06/28/23 174 lb 3.2 oz (79 kg)  06/25/23 170 lb (77.1 kg)  06/20/23 171  lb (77.6 kg)    There is no height or weight on file to calculate BMI.  Performance status (ECOG): 1 - Symptomatic but completely ambulatory  PHYSICAL EXAM:   Physical Exam Vitals and nursing note reviewed. Exam conducted with a chaperone present.  Constitutional:      Appearance: Normal appearance.  Cardiovascular:     Rate and Rhythm: Normal rate and regular rhythm.     Pulses: Normal pulses.     Heart sounds: Normal heart sounds.  Pulmonary:     Effort: Pulmonary effort is normal.     Breath sounds: Normal breath sounds.  Abdominal:     Palpations: Abdomen is soft. There is no hepatomegaly, splenomegaly or mass.     Tenderness: There is no abdominal tenderness.  Musculoskeletal:     Right lower leg: No edema.     Left lower leg: No edema.  Lymphadenopathy:     Cervical: No cervical adenopathy.     Right cervical: No superficial, deep or posterior cervical adenopathy.    Left cervical: No superficial, deep or posterior cervical adenopathy.     Upper Body:      Right upper body: No supraclavicular or axillary adenopathy.     Left upper body: No supraclavicular or axillary adenopathy.  Neurological:     General: No focal deficit present.     Mental Status: She is alert and oriented to person, place, and time.  Psychiatric:        Mood and Affect: Mood normal.        Behavior: Behavior normal.     LABS:      Latest Ref Rng & Units 06/28/2023    9:25 AM 06/25/2023    3:17 PM 06/20/2023    9:37 PM  CBC  WBC 4.0 - 10.5 K/uL 4.8  6.6  2.4   Hemoglobin 12.0 - 15.0 g/dL 8.0  8.8  9.1   Hematocrit 36.0 - 46.0 % 25.7  27.2  28.1   Platelets 150 - 400 K/uL 208  245  157       Latest Ref Rng & Units 06/28/2023    9:24 AM 06/25/2023    3:17 PM 06/20/2023    9:37 PM  CMP  Glucose 70 - 99 mg/dL 98  96  960   BUN 8 - 23 mg/dL 26  18  44   Creatinine 0.44 - 1.00 mg/dL 4.54  0.98  11.91   Sodium 135 - 145 mmol/L 139  135  136   Potassium 3.5 - 5.1 mmol/L 3.5  3.2  4.2   Chloride 98 - 111 mmol/L 96  94  94   CO2 22 - 32 mmol/L 29  32  29   Calcium 8.9 - 10.3 mg/dL 9.3  9.0  9.4   Total Protein 6.5 - 8.1 g/dL 6.5  6.8  6.9   Total Bilirubin <1.2 mg/dL 0.4  0.4  0.4   Alkaline Phos 38 - 126 U/L 109  98  102   AST 15 - 41 U/L 16  19  19    ALT 0 - 44 U/L 16  18  20       No results found for: "CEA1", "CEA" / No results found for: "CEA1", "CEA" No results found for: "PSA1" No results found for: "YNW295" No results found for: "CAN125"  No results found for: "TOTALPROTELP", "ALBUMINELP", "A1GS", "A2GS", "BETS", "BETA2SER", "GAMS", "MSPIKE", "SPEI" Lab Results  Component Value Date   TIBC 195 (L) 02/15/2021   TIBC  190 (L) 12/10/2018   TIBC 218 (L) 06/08/2016   FERRITIN 1,742 (H) 02/15/2021   FERRITIN 1,308 (H) 12/10/2018   FERRITIN 126 06/08/2016   IRONPCTSAT 15 02/15/2021   IRONPCTSAT 43 (H) 12/10/2018   IRONPCTSAT 22 06/08/2016   No results found for: "LDH"   STUDIES:   NM PET Image Restag (PS) Skull Base To Thigh  Result Date:  06/13/2023 CLINICAL DATA:  Subsequent treatment strategy for metastatic adenocarcinoma. Post RIGHT mastectomy RIGHT breast cancer. EXAM: NUCLEAR MEDICINE PET SKULL BASE TO THIGH TECHNIQUE: 9.3 mCi F-18 FDG was injected intravenously. Full-ring PET imaging was performed from the skull base to thigh after the radiotracer. CT data was obtained and used for attenuation correction and anatomic localization. Fasting blood glucose: 113 mg/dl COMPARISON:  PET scan 29/52/8413 FINDINGS: Mediastinal blood pool activity: SUV max Liver activity: SUV max NA NECK: No hypermetabolic lymph nodes in the neck. Incidental CT findings: None. CHEST: Persistent hypermetabolic tissue in the LEFT chest wall localizing to the pectoralis muscle. Activity is intense (SUV max equal 5.7) but significantly decreased from comparison exam (SUV max lesion not well identified on the noncontrast CT. No hypermetabolic supraclavicular axillary lymph nodes. No hypermetabolic mediastinal lymph nodes. Intense metabolic activity associated with musculature superficial to the RIGHT shoulder is favored benign physiologic. Similar activity in the proximal LEFT upper arm. Incidental CT findings: None. ABDOMEN/PELVIS: Intense metabolic activity associated with the gastric antrum. No focal lesion identified (image 91). No abnormal metabolic activity liver. No hypermetabolic abdominopelvic lymph nodes. Incidental CT findings: None. SKELETON: No aggressive osseous lesion Incidental CT findings: None. IMPRESSION: 1. Marked decrease in metabolic activity localizing to the RIGHT pectoralis muscle. Residual metabolic activity may represent treatment effect or residual carcinoma. 2. No additional evidence of metastatic breast carcinoma. 3. Metabolic activity in the distal stomach is favored inflammatory. Electronically Signed   By: Genevive Bi M.D.   On: 06/13/2023 16:29   DG Chest Port 1 View  Result Date: 06/07/2023 CLINICAL DATA:  Status post Port-A-Cath  insertion EXAM: PORTABLE CHEST 1 VIEW COMPARISON:  Chest x-ray dated September 12, 2022 FINDINGS: Interval placement of right-sided port with tip overlying the expected area of the lower SVC. The heart size and mediastinal contours are within normal limits. Both lungs are clear. No evidence of pleural effusion or pneumothorax. IMPRESSION: Right-sided port with tip overlying the expected area of the lower SVC. Electronically Signed   By: Allegra Lai M.D.   On: 06/07/2023 12:26   DG C-Arm 1-60 Min-No Report  Result Date: 06/07/2023 Fluoroscopy was utilized by the requesting physician.  No radiographic interpretation.

## 2023-06-28 ENCOUNTER — Inpatient Hospital Stay: Payer: Medicare Other | Admitting: Hematology

## 2023-06-28 ENCOUNTER — Inpatient Hospital Stay: Payer: Medicare Other

## 2023-06-28 DIAGNOSIS — C50211 Malignant neoplasm of upper-inner quadrant of right female breast: Secondary | ICD-10-CM | POA: Diagnosis not present

## 2023-06-28 DIAGNOSIS — M8580 Other specified disorders of bone density and structure, unspecified site: Secondary | ICD-10-CM | POA: Diagnosis not present

## 2023-06-28 DIAGNOSIS — R918 Other nonspecific abnormal finding of lung field: Secondary | ICD-10-CM | POA: Diagnosis not present

## 2023-06-28 DIAGNOSIS — C7989 Secondary malignant neoplasm of other specified sites: Secondary | ICD-10-CM | POA: Diagnosis not present

## 2023-06-28 DIAGNOSIS — C799 Secondary malignant neoplasm of unspecified site: Secondary | ICD-10-CM

## 2023-06-28 DIAGNOSIS — C50911 Malignant neoplasm of unspecified site of right female breast: Secondary | ICD-10-CM | POA: Diagnosis not present

## 2023-06-28 DIAGNOSIS — Z170 Estrogen receptor positive status [ER+]: Secondary | ICD-10-CM | POA: Diagnosis not present

## 2023-06-28 DIAGNOSIS — Z5111 Encounter for antineoplastic chemotherapy: Secondary | ICD-10-CM | POA: Diagnosis not present

## 2023-06-28 DIAGNOSIS — R531 Weakness: Secondary | ICD-10-CM | POA: Diagnosis not present

## 2023-06-28 LAB — CBC WITH DIFFERENTIAL/PLATELET
Abs Immature Granulocytes: 0.05 10*3/uL (ref 0.00–0.07)
Basophils Absolute: 0.1 10*3/uL (ref 0.0–0.1)
Basophils Relative: 2 %
Eosinophils Absolute: 0.1 10*3/uL (ref 0.0–0.5)
Eosinophils Relative: 3 %
HCT: 25.7 % — ABNORMAL LOW (ref 36.0–46.0)
Hemoglobin: 8 g/dL — ABNORMAL LOW (ref 12.0–15.0)
Immature Granulocytes: 1 %
Lymphocytes Relative: 23 %
Lymphs Abs: 1.1 10*3/uL (ref 0.7–4.0)
MCH: 32.9 pg (ref 26.0–34.0)
MCHC: 31.1 g/dL (ref 30.0–36.0)
MCV: 105.8 fL — ABNORMAL HIGH (ref 80.0–100.0)
Monocytes Absolute: 0.6 10*3/uL (ref 0.1–1.0)
Monocytes Relative: 13 %
Neutro Abs: 2.8 10*3/uL (ref 1.7–7.7)
Neutrophils Relative %: 58 %
Platelets: 208 10*3/uL (ref 150–400)
RBC: 2.43 MIL/uL — ABNORMAL LOW (ref 3.87–5.11)
RDW: 14.7 % (ref 11.5–15.5)
WBC: 4.8 10*3/uL (ref 4.0–10.5)
nRBC: 0 % (ref 0.0–0.2)

## 2023-06-28 LAB — COMPREHENSIVE METABOLIC PANEL
ALT: 16 U/L (ref 0–44)
AST: 16 U/L (ref 15–41)
Albumin: 3.3 g/dL — ABNORMAL LOW (ref 3.5–5.0)
Alkaline Phosphatase: 109 U/L (ref 38–126)
Anion gap: 14 (ref 5–15)
BUN: 26 mg/dL — ABNORMAL HIGH (ref 8–23)
CO2: 29 mmol/L (ref 22–32)
Calcium: 9.3 mg/dL (ref 8.9–10.3)
Chloride: 96 mmol/L — ABNORMAL LOW (ref 98–111)
Creatinine, Ser: 8.9 mg/dL — ABNORMAL HIGH (ref 0.44–1.00)
GFR, Estimated: 5 mL/min — ABNORMAL LOW (ref >60)
Glucose, Bld: 98 mg/dL (ref 70–99)
Potassium: 3.5 mmol/L (ref 3.5–5.1)
Sodium: 139 mmol/L (ref 135–145)
Total Bilirubin: 0.4 mg/dL (ref <1.2)
Total Protein: 6.5 g/dL (ref 6.5–8.1)

## 2023-06-28 LAB — MAGNESIUM: Magnesium: 2.1 mg/dL (ref 1.7–2.4)

## 2023-06-28 MED ORDER — HEPARIN SOD (PORK) LOCK FLUSH 100 UNIT/ML IV SOLN
500.0000 [IU] | Freq: Once | INTRAVENOUS | Status: AC
  Administered 2023-06-28: 500 [IU] via INTRAVENOUS

## 2023-06-28 MED ORDER — SODIUM CHLORIDE 0.9% FLUSH
10.0000 mL | Freq: Once | INTRAVENOUS | Status: AC
  Administered 2023-06-28: 10 mL via INTRAVENOUS

## 2023-06-28 NOTE — Patient Instructions (Signed)
[facility] at [facility] Discharge Instructions   You were seen and examined today by Dr. Ellin Saba.  He reviewed the results of your lab work which are normal/stable.   We will proceed with your treatment next week.   Return as scheduled.    Thank you for choosing [facility] at [facility] to provide your oncology and hematology care.  To afford each patient quality time with our provider, please arrive at least 15 minutes before your scheduled appointment time.   If you have a lab appointment with the Cancer Center please come in thru the Main Entrance and check in at the main information desk.  You need to re-schedule your appointment should you arrive 10 or more minutes late.  We strive to give you quality time with our providers, and arriving late affects you and other patients whose appointments are after yours.  Also, if you no show three or more times for appointments you may be dismissed from the clinic at the providers discretion.     Again, thank you for choosing [facility].  Our hope is that these requests will decrease the amount of time that you wait before being seen by our physicians.       _____________________________________________________________  Should you have questions after your visit to [facility], please contact our office at [phone] and follow the prompts.  Our office hours are 8:00 a.m. and 4:30 p.m. Monday - Friday.  Please note that voicemails left after 4:00 p.m. may not be returned until the following business day.  We are closed weekends and major holidays.  You do have access to a nurse 24-7, just call the main number to the clinic [phone] and do not press any options, hold on the line and a nurse will answer the phone.    For prescription refill requests, have your pharmacy contact our office and allow 72 hours.    Due to Covid, you will need to wear a mask upon  entering the hospital. If you do not have a mask, a mask will be given to you at the Main Entrance upon arrival. For doctor visits, patients may have 1 support person age 77 or older with them. For treatment visits, patients can not have anyone with them due to social distancing guidelines and our immunocompromised population.

## 2023-06-28 NOTE — Progress Notes (Signed)
Patients port flushed without difficulty.  Good blood return noted with no bruising or swelling noted at site.  Band aid applied.  VSS with discharge and left in satisfactory condition with no s/s of distress noted.   

## 2023-06-29 DIAGNOSIS — Z992 Dependence on renal dialysis: Secondary | ICD-10-CM | POA: Diagnosis not present

## 2023-06-29 DIAGNOSIS — N2581 Secondary hyperparathyroidism of renal origin: Secondary | ICD-10-CM | POA: Diagnosis not present

## 2023-06-29 DIAGNOSIS — D631 Anemia in chronic kidney disease: Secondary | ICD-10-CM | POA: Diagnosis not present

## 2023-06-29 DIAGNOSIS — N186 End stage renal disease: Secondary | ICD-10-CM | POA: Diagnosis not present

## 2023-06-29 DIAGNOSIS — E1129 Type 2 diabetes mellitus with other diabetic kidney complication: Secondary | ICD-10-CM | POA: Diagnosis not present

## 2023-07-02 ENCOUNTER — Other Ambulatory Visit (HOSPITAL_COMMUNITY): Payer: Self-pay | Admitting: Nephrology

## 2023-07-02 DIAGNOSIS — N2581 Secondary hyperparathyroidism of renal origin: Secondary | ICD-10-CM | POA: Diagnosis not present

## 2023-07-02 DIAGNOSIS — E1129 Type 2 diabetes mellitus with other diabetic kidney complication: Secondary | ICD-10-CM | POA: Diagnosis not present

## 2023-07-02 DIAGNOSIS — Z992 Dependence on renal dialysis: Secondary | ICD-10-CM | POA: Diagnosis not present

## 2023-07-02 DIAGNOSIS — N186 End stage renal disease: Secondary | ICD-10-CM

## 2023-07-02 DIAGNOSIS — D631 Anemia in chronic kidney disease: Secondary | ICD-10-CM | POA: Diagnosis not present

## 2023-07-04 DIAGNOSIS — N2581 Secondary hyperparathyroidism of renal origin: Secondary | ICD-10-CM | POA: Diagnosis not present

## 2023-07-04 DIAGNOSIS — Z992 Dependence on renal dialysis: Secondary | ICD-10-CM | POA: Diagnosis not present

## 2023-07-04 DIAGNOSIS — N186 End stage renal disease: Secondary | ICD-10-CM | POA: Diagnosis not present

## 2023-07-04 DIAGNOSIS — D631 Anemia in chronic kidney disease: Secondary | ICD-10-CM | POA: Diagnosis not present

## 2023-07-04 DIAGNOSIS — E1129 Type 2 diabetes mellitus with other diabetic kidney complication: Secondary | ICD-10-CM | POA: Diagnosis not present

## 2023-07-05 ENCOUNTER — Inpatient Hospital Stay: Payer: Medicare Other

## 2023-07-05 ENCOUNTER — Other Ambulatory Visit: Payer: Self-pay | Admitting: Oncology

## 2023-07-05 ENCOUNTER — Inpatient Hospital Stay: Payer: Medicare Other | Admitting: Hematology

## 2023-07-05 VITALS — BP 141/77 | HR 80 | Resp 18

## 2023-07-05 DIAGNOSIS — R918 Other nonspecific abnormal finding of lung field: Secondary | ICD-10-CM | POA: Diagnosis not present

## 2023-07-05 DIAGNOSIS — M8580 Other specified disorders of bone density and structure, unspecified site: Secondary | ICD-10-CM | POA: Diagnosis not present

## 2023-07-05 DIAGNOSIS — R531 Weakness: Secondary | ICD-10-CM | POA: Diagnosis not present

## 2023-07-05 DIAGNOSIS — C50211 Malignant neoplasm of upper-inner quadrant of right female breast: Secondary | ICD-10-CM | POA: Diagnosis not present

## 2023-07-05 DIAGNOSIS — C50911 Malignant neoplasm of unspecified site of right female breast: Secondary | ICD-10-CM

## 2023-07-05 DIAGNOSIS — Z170 Estrogen receptor positive status [ER+]: Secondary | ICD-10-CM | POA: Diagnosis not present

## 2023-07-05 DIAGNOSIS — Z5111 Encounter for antineoplastic chemotherapy: Secondary | ICD-10-CM | POA: Diagnosis not present

## 2023-07-05 DIAGNOSIS — C799 Secondary malignant neoplasm of unspecified site: Secondary | ICD-10-CM

## 2023-07-05 LAB — CBC WITH DIFFERENTIAL/PLATELET
Abs Immature Granulocytes: 0.01 10*3/uL (ref 0.00–0.07)
Basophils Absolute: 0.1 10*3/uL (ref 0.0–0.1)
Basophils Relative: 2 %
Eosinophils Absolute: 0.7 10*3/uL — ABNORMAL HIGH (ref 0.0–0.5)
Eosinophils Relative: 12 %
HCT: 27.6 % — ABNORMAL LOW (ref 36.0–46.0)
Hemoglobin: 8.9 g/dL — ABNORMAL LOW (ref 12.0–15.0)
Immature Granulocytes: 0 %
Lymphocytes Relative: 18 %
Lymphs Abs: 1.2 10*3/uL (ref 0.7–4.0)
MCH: 34.4 pg — ABNORMAL HIGH (ref 26.0–34.0)
MCHC: 32.2 g/dL (ref 30.0–36.0)
MCV: 106.6 fL — ABNORMAL HIGH (ref 80.0–100.0)
Monocytes Absolute: 0.8 10*3/uL (ref 0.1–1.0)
Monocytes Relative: 12 %
Neutro Abs: 3.5 10*3/uL (ref 1.7–7.7)
Neutrophils Relative %: 56 %
Platelets: 247 10*3/uL (ref 150–400)
RBC: 2.59 MIL/uL — ABNORMAL LOW (ref 3.87–5.11)
RDW: 16.1 % — ABNORMAL HIGH (ref 11.5–15.5)
WBC: 6.2 10*3/uL (ref 4.0–10.5)
nRBC: 0 % (ref 0.0–0.2)

## 2023-07-05 LAB — COMPREHENSIVE METABOLIC PANEL
ALT: 22 U/L (ref 0–44)
AST: 18 U/L (ref 15–41)
Albumin: 3.5 g/dL (ref 3.5–5.0)
Alkaline Phosphatase: 146 U/L — ABNORMAL HIGH (ref 38–126)
Anion gap: 12 (ref 5–15)
BUN: 28 mg/dL — ABNORMAL HIGH (ref 8–23)
CO2: 21 mmol/L — ABNORMAL LOW (ref 22–32)
Calcium: 10 mg/dL (ref 8.9–10.3)
Chloride: 106 mmol/L (ref 98–111)
Creatinine, Ser: 8.98 mg/dL — ABNORMAL HIGH (ref 0.44–1.00)
GFR, Estimated: 5 mL/min — ABNORMAL LOW (ref >60)
Glucose, Bld: 133 mg/dL — ABNORMAL HIGH (ref 70–99)
Potassium: 4.1 mmol/L (ref 3.5–5.1)
Sodium: 139 mmol/L (ref 135–145)
Total Bilirubin: 0.5 mg/dL (ref <1.2)
Total Protein: 7.1 g/dL (ref 6.5–8.1)

## 2023-07-05 LAB — MAGNESIUM: Magnesium: 2.2 mg/dL (ref 1.7–2.4)

## 2023-07-05 MED ORDER — SODIUM CHLORIDE 0.9 % IV SOLN
200.0000 mg | Freq: Once | INTRAVENOUS | Status: AC
  Administered 2023-07-05: 200 mg via INTRAVENOUS
  Filled 2023-07-05: qty 8

## 2023-07-05 MED ORDER — DEXAMETHASONE SODIUM PHOSPHATE 100 MG/10ML IJ SOLN: 10.0000 mg | Freq: Once | INTRAMUSCULAR | Status: DC

## 2023-07-05 MED ORDER — DEXAMETHASONE SODIUM PHOSPHATE 10 MG/ML IJ SOLN
10.0000 mg | Freq: Once | INTRAMUSCULAR | Status: AC
Start: 2023-07-05 — End: 2023-07-05
  Administered 2023-07-05: 10 mg via INTRAVENOUS
  Filled 2023-07-05: qty 1

## 2023-07-05 MED ORDER — ONDANSETRON HCL 4 MG PO TABS: 8.0000 mg | ORAL_TABLET | Freq: Three times a day (TID) | ORAL | 0 refills | Status: DC | PRN

## 2023-07-05 MED ORDER — SODIUM CHLORIDE 0.9 % IV SOLN
150.0000 mg | Freq: Once | INTRAVENOUS | Status: AC
  Administered 2023-07-05: 150 mg via INTRAVENOUS
  Filled 2023-07-05: qty 150

## 2023-07-05 MED ORDER — SODIUM CHLORIDE 0.9 % IV SOLN
60.0000 mg/m2 | Freq: Once | INTRAVENOUS | Status: AC
  Administered 2023-07-05: 112 mg via INTRAVENOUS
  Filled 2023-07-05: qty 11.2

## 2023-07-05 MED ORDER — HEPARIN SOD (PORK) LOCK FLUSH 100 UNIT/ML IV SOLN
500.0000 [IU] | Freq: Once | INTRAVENOUS | Status: AC | PRN
  Administered 2023-07-05: 500 [IU]

## 2023-07-05 MED ORDER — CARBOPLATIN CHEMO INJECTION 450 MG/45ML
160.0000 mg | Freq: Once | INTRAVENOUS | Status: AC
  Administered 2023-07-05: 160 mg via INTRAVENOUS
  Filled 2023-07-05: qty 16

## 2023-07-05 MED ORDER — PALONOSETRON HCL INJECTION 0.25 MG/5ML
0.2500 mg | Freq: Once | INTRAVENOUS | Status: AC
  Administered 2023-07-05: 0.25 mg via INTRAVENOUS
  Filled 2023-07-05: qty 5

## 2023-07-05 MED ORDER — SODIUM CHLORIDE 0.9 % IV SOLN: Freq: Once | INTRAVENOUS | Status: AC

## 2023-07-05 NOTE — Patient Instructions (Signed)
[facility]  Discharge Instructions: Thank you for choosing [facility] to provide your oncology and hematology care.  If you have a lab appointment with the Cancer Center - please note that after April 8th, 2024, all labs will be drawn in the cancer center.  You do not have to check in or register with the main entrance as you have in the past but will complete your check-in in the cancer center.  Wear comfortable clothing and clothing appropriate for easy access to any Portacath or PICC line.   We strive to give you quality time with your provider. You may need to reschedule your appointment if you arrive late (15 or more minutes).  Arriving late affects you and other patients whose appointments are after yours.  Also, if you miss three or more appointments without notifying the office, you may be dismissed from the clinic at the provider's discretion.      For prescription refill requests, have your pharmacy contact our office and allow 72 hours for refills to be completed.    Today you received the following chemotherapy and/or immunotherapy agents Keytruda, Taxotere, Carboplatin   To help prevent nausea and vomiting after your treatment, we encourage you to take your nausea medication as directed.  BELOW ARE SYMPTOMS THAT SHOULD BE REPORTED IMMEDIATELY: *FEVER GREATER THAN 100.4 F (38 C) OR HIGHER *CHILLS OR SWEATING *NAUSEA AND VOMITING THAT IS NOT CONTROLLED WITH YOUR NAUSEA MEDICATION *UNUSUAL SHORTNESS OF BREATH *UNUSUAL BRUISING OR BLEEDING *URINARY PROBLEMS (pain or burning when urinating, or frequent urination) *BOWEL PROBLEMS (unusual diarrhea, constipation, pain near the anus) TENDERNESS IN MOUTH AND THROAT WITH OR WITHOUT PRESENCE OF ULCERS (sore throat, sores in mouth, or a toothache) UNUSUAL RASH, SWELLING OR PAIN  UNUSUAL VAGINAL DISCHARGE OR ITCHING   Items with * indicate a potential emergency and  should be followed up as soon as possible or go to the Emergency Department if any problems should occur.  Please show the CHEMOTHERAPY ALERT CARD or IMMUNOTHERAPY ALERT CARD at check-in to the Emergency Department and triage nurse.  Should you have questions after your visit or need to cancel or reschedule your appointment, please contact [facility] - A DEPT OF [facility] [phone]  and follow the prompts.  Office hours are 8:00 a.m. to 4:30 p.m. Monday - Friday. Please note that voicemails left after 4:00 p.m. may not be returned until the following business day.  We are closed weekends and major holidays. You have access to a nurse at all times for urgent questions. Please call the main number to the clinic [phone] and follow the prompts.  For any non-urgent questions, you may also contact your provider using MyChart. We now offer e-Visits for anyone 43 and older to request care online for non-urgent symptoms. For details visit [email or web address].   Also download the MyChart app! Go to the app store, search "MyChart", open the app, select [place], and log in with your MyChart username and password.

## 2023-07-05 NOTE — Progress Notes (Signed)
Patient tolerated chemotherapy with no complaints voiced.  Side effects with management reviewed with understanding verbalized.  Port site clean and dry with no bruising or swelling noted at site.  Good blood return noted before and after administration of chemotherapy.  Band aid applied.  Patient left in satisfactory condition with VSS and no s/s of distress noted. All follow ups as scheduled.   Megan [name] [employer]

## 2023-07-06 ENCOUNTER — Inpatient Hospital Stay: Payer: Medicare Other

## 2023-07-06 VITALS — BP 124/64 | HR 84 | Temp 97.8°F | Resp 18

## 2023-07-06 DIAGNOSIS — M8580 Other specified disorders of bone density and structure, unspecified site: Secondary | ICD-10-CM | POA: Diagnosis not present

## 2023-07-06 DIAGNOSIS — E1129 Type 2 diabetes mellitus with other diabetic kidney complication: Secondary | ICD-10-CM | POA: Diagnosis not present

## 2023-07-06 DIAGNOSIS — R918 Other nonspecific abnormal finding of lung field: Secondary | ICD-10-CM | POA: Diagnosis not present

## 2023-07-06 DIAGNOSIS — C50211 Malignant neoplasm of upper-inner quadrant of right female breast: Secondary | ICD-10-CM | POA: Diagnosis not present

## 2023-07-06 DIAGNOSIS — Z170 Estrogen receptor positive status [ER+]: Secondary | ICD-10-CM | POA: Diagnosis not present

## 2023-07-06 DIAGNOSIS — N2581 Secondary hyperparathyroidism of renal origin: Secondary | ICD-10-CM | POA: Diagnosis not present

## 2023-07-06 DIAGNOSIS — Z992 Dependence on renal dialysis: Secondary | ICD-10-CM | POA: Diagnosis not present

## 2023-07-06 DIAGNOSIS — R531 Weakness: Secondary | ICD-10-CM | POA: Diagnosis not present

## 2023-07-06 DIAGNOSIS — N186 End stage renal disease: Secondary | ICD-10-CM | POA: Diagnosis not present

## 2023-07-06 DIAGNOSIS — D631 Anemia in chronic kidney disease: Secondary | ICD-10-CM | POA: Diagnosis not present

## 2023-07-06 DIAGNOSIS — Z5111 Encounter for antineoplastic chemotherapy: Secondary | ICD-10-CM | POA: Diagnosis not present

## 2023-07-06 DIAGNOSIS — C50911 Malignant neoplasm of unspecified site of right female breast: Secondary | ICD-10-CM

## 2023-07-06 MED ORDER — PEGFILGRASTIM-FPGK 6 MG/0.6ML ~~LOC~~ SOSY
6.0000 mg | PREFILLED_SYRINGE | Freq: Once | SUBCUTANEOUS | Status: AC
  Administered 2023-07-06: 6 mg via SUBCUTANEOUS
  Filled 2023-07-06: qty 0.6

## 2023-07-06 NOTE — Progress Notes (Signed)
Patient tolerated Stimufend injection with no complaints voiced.  Site clean and dry with no bruising or swelling noted.  No complaints of pain.  Discharged with vital signs stable and no signs or symptoms of distress noted.

## 2023-07-06 NOTE — Patient Instructions (Signed)
 [facility]  Discharge Instructions: Thank you for choosing [facility] to provide your oncology and hematology care.  If you have a lab appointment with the Cancer Center - please note that after April 8th, 2024, all labs will be drawn in the cancer center.  You do not have to check in or register with the main entrance as you have in the past but will complete your check-in in the cancer center.  Wear comfortable clothing and clothing appropriate for easy access to any Portacath or PICC line.   We strive to give you quality time with your provider. You may need to reschedule your appointment if you arrive late (15 or more minutes).  Arriving late affects you and other patients whose appointments are after yours.  Also, if you miss three or more appointments without notifying the office, you may be dismissed from the clinic at the provider's discretion.      For prescription refill requests, have your pharmacy contact our office and allow 72 hours for refills to be completed.    Today you received the following chemotherapy and/or immunotherapy agents Stimufend.  Pegfilgrastim Injection What is this medication? PEGFILGRASTIM (PEG fil gra stim) lowers the risk of infection in people who are receiving chemotherapy. It works by Systems analyst make more white blood cells, which protects your body from infection. It may also be used to help people who have been exposed to high doses of radiation. This medicine may be used for other purposes; ask your health care provider or pharmacist if you have questions. COMMON BRAND NAME(S): Cherly Hensen, Neulasta, Nyvepria, Stimufend, UDENYCA, UDENYCA ONBODY, Ziextenzo What should I tell my care team before I take this medication? They need to know if you have any of these conditions: Kidney disease Latex allergy Ongoing radiation therapy Sickle cell disease Skin reactions to  acrylic adhesives (On-Body Injector only) An unusual or allergic reaction to pegfilgrastim, filgrastim, other medications, foods, dyes, or preservatives Pregnant or trying to get pregnant Breast-feeding How should I use this medication? This medication is for injection under the skin. If you get this medication at home, you will be taught how to prepare and give the pre-filled syringe or how to use the On-body Injector. Refer to the patient Instructions for Use for detailed instructions. Use exactly as directed. Tell your care team immediately if you suspect that the On-body Injector may not have performed as intended or if you suspect the use of the On-body Injector resulted in a missed or partial dose. It is important that you put your used needles and syringes in a special sharps container. Do not put them in a trash can. If you do not have a sharps container, call your pharmacist or care team to get one. Talk to your care team about the use of this medication in children. While this medication may be prescribed for selected conditions, precautions do apply. Overdosage: If you think you have taken too much of this medicine contact a poison control center or emergency room at once. NOTE: This medicine is only for you. Do not share this medicine with others. What if I miss a dose? It is important not to miss your dose. Call your care team if you miss your dose. If you miss a dose due to an On-body Injector failure or leakage, a new dose should be administered as soon as possible using a single prefilled syringe for manual  use. What may interact with this medication? Interactions have not been studied. This list may not describe all possible interactions. Give your health care provider a list of all the medicines, herbs, non-prescription drugs, or dietary supplements you use. Also tell them if you smoke, drink alcohol, or use illegal drugs. Some items may interact with your medicine. What should I  watch for while using this medication? Your condition will be monitored carefully while you are receiving this medication. You may need blood work done while you are taking this medication. Talk to your care team about your risk of cancer. You may be more at risk for certain types of cancer if you take this medication. If you are going to need a MRI, CT scan, or other procedure, tell your care team that you are using this medication (On-Body Injector only). What side effects may I notice from receiving this medication? Side effects that you should report to your care team as soon as possible: Allergic reactions--skin rash, itching, hives, swelling of the face, lips, tongue, or throat Capillary leak syndrome--stomach or muscle pain, unusual weakness or fatigue, feeling faint or lightheaded, decrease in the amount of urine, swelling of the ankles, hands, or feet, trouble breathing High white blood cell level--fever, fatigue, trouble breathing, night sweats, change in vision, weight loss Inflammation of the aorta--fever, fatigue, back, chest, or stomach pain, severe headache Kidney injury (glomerulonephritis)--decrease in the amount of urine, red or dark brown urine, foamy or bubbly urine, swelling of the ankles, hands, or feet Shortness of breath or trouble breathing Spleen injury--pain in upper left stomach or shoulder Unusual bruising or bleeding Side effects that usually do not require medical attention (report to your care team if they continue or are bothersome): Bone pain Pain in the hands or feet This list may not describe all possible side effects. Call your doctor for medical advice about side effects. You may report side effects to FDA at 1-800-FDA-1088. Where should I keep my medication? Keep out of the reach of children. If you are using this medication at home, you will be instructed on how to store it. Throw away any unused medication after the expiration date on the label. NOTE:  This sheet is a summary. It may not cover all possible information. If you have questions about this medicine, talk to your doctor, pharmacist, or health care provider.  2024 Elsevier/Gold Standard (2021-07-01 00:00:00)     To help prevent nausea and vomiting after your treatment, we encourage you to take your nausea medication as directed.  BELOW ARE SYMPTOMS THAT SHOULD BE REPORTED IMMEDIATELY: *FEVER GREATER THAN 100.4 F (38 C) OR HIGHER *CHILLS OR SWEATING *NAUSEA AND VOMITING THAT IS NOT CONTROLLED WITH YOUR NAUSEA MEDICATION *UNUSUAL SHORTNESS OF BREATH *UNUSUAL BRUISING OR BLEEDING *URINARY PROBLEMS (pain or burning when urinating, or frequent urination) *BOWEL PROBLEMS (unusual diarrhea, constipation, pain near the anus) TENDERNESS IN MOUTH AND THROAT WITH OR WITHOUT PRESENCE OF ULCERS (sore throat, sores in mouth, or a toothache) UNUSUAL RASH, SWELLING OR PAIN  UNUSUAL VAGINAL DISCHARGE OR ITCHING   Items with * indicate a potential emergency and should be followed up as soon as possible or go to the Emergency Department if any problems should occur.  Please show the CHEMOTHERAPY ALERT CARD or IMMUNOTHERAPY ALERT CARD at check-in to the Emergency Department and triage nurse.  Should you have questions after your visit or need to cancel or reschedule your appointment, please contact [facility] - [facility] OF [facility]  Rexene Edison [facility] [phone]  and follow the prompts.  Office hours are 8:00 a.m. to 4:30 p.m. Monday - Friday. Please note that voicemails left after 4:00 p.m. may not be returned until the following business day.  We are closed weekends and major holidays. You have access to a nurse at all times for urgent questions. Please call the main number to the clinic [phone] and follow the prompts.  For any non-urgent questions, you may also contact your provider using MyChart. We now offer e-Visits for anyone 38 and older to request care online for  non-urgent symptoms. For details visit [email or web address].   Also download the MyChart app! Go to the app store, search "MyChart", open the app, select [place], and log in with your MyChart username and password.

## 2023-07-08 DIAGNOSIS — D631 Anemia in chronic kidney disease: Secondary | ICD-10-CM | POA: Diagnosis not present

## 2023-07-08 DIAGNOSIS — E1129 Type 2 diabetes mellitus with other diabetic kidney complication: Secondary | ICD-10-CM | POA: Diagnosis not present

## 2023-07-08 DIAGNOSIS — N186 End stage renal disease: Secondary | ICD-10-CM | POA: Diagnosis not present

## 2023-07-08 DIAGNOSIS — N2581 Secondary hyperparathyroidism of renal origin: Secondary | ICD-10-CM | POA: Diagnosis not present

## 2023-07-08 DIAGNOSIS — Z992 Dependence on renal dialysis: Secondary | ICD-10-CM | POA: Diagnosis not present

## 2023-07-10 DIAGNOSIS — N186 End stage renal disease: Secondary | ICD-10-CM | POA: Diagnosis not present

## 2023-07-10 DIAGNOSIS — E039 Hypothyroidism, unspecified: Secondary | ICD-10-CM | POA: Diagnosis not present

## 2023-07-10 DIAGNOSIS — E1129 Type 2 diabetes mellitus with other diabetic kidney complication: Secondary | ICD-10-CM | POA: Diagnosis not present

## 2023-07-10 DIAGNOSIS — Z992 Dependence on renal dialysis: Secondary | ICD-10-CM | POA: Diagnosis not present

## 2023-07-10 DIAGNOSIS — N2581 Secondary hyperparathyroidism of renal origin: Secondary | ICD-10-CM | POA: Diagnosis not present

## 2023-07-10 DIAGNOSIS — D631 Anemia in chronic kidney disease: Secondary | ICD-10-CM | POA: Diagnosis not present

## 2023-07-13 DIAGNOSIS — N2581 Secondary hyperparathyroidism of renal origin: Secondary | ICD-10-CM | POA: Diagnosis not present

## 2023-07-13 DIAGNOSIS — D631 Anemia in chronic kidney disease: Secondary | ICD-10-CM | POA: Diagnosis not present

## 2023-07-13 DIAGNOSIS — N186 End stage renal disease: Secondary | ICD-10-CM | POA: Diagnosis not present

## 2023-07-13 DIAGNOSIS — E1129 Type 2 diabetes mellitus with other diabetic kidney complication: Secondary | ICD-10-CM | POA: Diagnosis not present

## 2023-07-13 DIAGNOSIS — Z992 Dependence on renal dialysis: Secondary | ICD-10-CM | POA: Diagnosis not present

## 2023-07-15 DIAGNOSIS — N186 End stage renal disease: Secondary | ICD-10-CM | POA: Diagnosis not present

## 2023-07-15 DIAGNOSIS — Z992 Dependence on renal dialysis: Secondary | ICD-10-CM | POA: Diagnosis not present

## 2023-07-15 DIAGNOSIS — N040 Nephrotic syndrome with minor glomerular abnormality: Secondary | ICD-10-CM | POA: Diagnosis not present

## 2023-07-16 ENCOUNTER — Other Ambulatory Visit: Payer: Self-pay | Admitting: Radiology

## 2023-07-16 DIAGNOSIS — Z992 Dependence on renal dialysis: Secondary | ICD-10-CM | POA: Diagnosis not present

## 2023-07-16 DIAGNOSIS — D631 Anemia in chronic kidney disease: Secondary | ICD-10-CM | POA: Diagnosis not present

## 2023-07-16 DIAGNOSIS — E119 Type 2 diabetes mellitus without complications: Secondary | ICD-10-CM | POA: Diagnosis not present

## 2023-07-16 DIAGNOSIS — N2581 Secondary hyperparathyroidism of renal origin: Secondary | ICD-10-CM | POA: Diagnosis not present

## 2023-07-16 DIAGNOSIS — N186 End stage renal disease: Secondary | ICD-10-CM | POA: Diagnosis not present

## 2023-07-16 DIAGNOSIS — E1129 Type 2 diabetes mellitus with other diabetic kidney complication: Secondary | ICD-10-CM | POA: Diagnosis not present

## 2023-07-17 ENCOUNTER — Telehealth: Payer: Self-pay | Admitting: *Deleted

## 2023-07-17 ENCOUNTER — Other Ambulatory Visit: Payer: Self-pay | Admitting: *Deleted

## 2023-07-17 ENCOUNTER — Other Ambulatory Visit: Payer: Self-pay

## 2023-07-17 ENCOUNTER — Ambulatory Visit (HOSPITAL_COMMUNITY)
Admission: RE | Admit: 2023-07-17 | Discharge: 2023-07-17 | Disposition: A | Discharge status: Home / Self Care | Payer: Medicare Other | Source: Ambulatory Visit | Attending: Nephrology | Admitting: Nephrology

## 2023-07-17 DIAGNOSIS — Z853 Personal history of malignant neoplasm of breast: Secondary | ICD-10-CM | POA: Diagnosis not present

## 2023-07-17 DIAGNOSIS — I132 Hypertensive heart and chronic kidney disease with heart failure and with stage 5 chronic kidney disease, or end stage renal disease: Secondary | ICD-10-CM | POA: Insufficient documentation

## 2023-07-17 DIAGNOSIS — Y832 Surgical operation with anastomosis, bypass or graft as the cause of abnormal reaction of the patient, or of later complication, without mention of misadventure at the time of the procedure: Secondary | ICD-10-CM | POA: Diagnosis not present

## 2023-07-17 DIAGNOSIS — K921 Melena: Secondary | ICD-10-CM

## 2023-07-17 DIAGNOSIS — M7989 Other specified soft tissue disorders: Secondary | ICD-10-CM | POA: Insufficient documentation

## 2023-07-17 DIAGNOSIS — Z9013 Acquired absence of bilateral breasts and nipples: Secondary | ICD-10-CM | POA: Diagnosis not present

## 2023-07-17 DIAGNOSIS — E1122 Type 2 diabetes mellitus with diabetic chronic kidney disease: Secondary | ICD-10-CM | POA: Insufficient documentation

## 2023-07-17 DIAGNOSIS — R200 Anesthesia of skin: Secondary | ICD-10-CM | POA: Insufficient documentation

## 2023-07-17 DIAGNOSIS — Z992 Dependence on renal dialysis: Secondary | ICD-10-CM | POA: Diagnosis not present

## 2023-07-17 DIAGNOSIS — I5032 Chronic diastolic (congestive) heart failure: Secondary | ICD-10-CM | POA: Insufficient documentation

## 2023-07-17 DIAGNOSIS — N186 End stage renal disease: Secondary | ICD-10-CM | POA: Insufficient documentation

## 2023-07-17 DIAGNOSIS — Z79899 Other long term (current) drug therapy: Secondary | ICD-10-CM

## 2023-07-17 DIAGNOSIS — T82898A Other specified complication of vascular prosthetic devices, implants and grafts, initial encounter: Secondary | ICD-10-CM | POA: Insufficient documentation

## 2023-07-17 DIAGNOSIS — Z7982 Long term (current) use of aspirin: Secondary | ICD-10-CM | POA: Insufficient documentation

## 2023-07-17 DIAGNOSIS — C50911 Malignant neoplasm of unspecified site of right female breast: Secondary | ICD-10-CM

## 2023-07-17 HISTORY — PX: IR DIALY SHUNT INTRO NEEDLE/INTRACATH INITIAL W/IMG LEFT: IMG6102

## 2023-07-17 LAB — GLUCOSE, CAPILLARY: Glucose-Capillary: 86 mg/dL (ref 70–99)

## 2023-07-17 MED ORDER — MIDAZOLAM HCL 2 MG/2ML IJ SOLN
INTRAMUSCULAR | Status: AC
  Filled 2023-07-17: qty 2

## 2023-07-17 MED ORDER — IOHEXOL 300 MG/ML  SOLN
50.0000 mL | Freq: Once | INTRAMUSCULAR | Status: AC | PRN
  Administered 2023-07-17: 40 mL via INTRA_ARTERIAL

## 2023-07-17 MED ORDER — MIDAZOLAM HCL 2 MG/2ML IJ SOLN
INTRAMUSCULAR | Status: AC | PRN
  Administered 2023-07-17: 1 mg via INTRAVENOUS

## 2023-07-17 MED ORDER — FENTANYL CITRATE (PF) 100 MCG/2ML IJ SOLN
INTRAMUSCULAR | Status: AC
  Filled 2023-07-17: qty 2

## 2023-07-17 NOTE — Telephone Encounter (Signed)
She is having blood in her stool since starting Keytruda. Per Dr. Ellin Saba, will bring in for CBCD/PT/PTT and provide with stool cards.  Patient aware.

## 2023-07-17 NOTE — H&P (Addendum)
Chief Complaint: Left arm swell and finger numbness. Request is for fistulogram with possible intervention. left forearm radial cephalic AVF,   Referring Physician(s): Coladonato,Joseph  Supervising Physician: Richarda Overlie  Patient Status: [facility] - Out-pt  History of Present Illness: Megan [name] is a 62 y.o. female  outpatient. History of DM, recurrent breast cancer (right) s/p mastectomy, ESRD was on PD changed HD in 2018  via a forearm  radial cephalic AVF, Now with left arm swelling and finger numbness. Patient states the swelling has been present since December 2023 when her AVF was infiltrated. Team is requesting fistulogram with possible intervention.    Husband at bedside. Patient alert and laying in bed,calm. Endorses left arm swelling and numbness to left finger.Denies any fevers, headache, chest pain, SOB, cough, abdominal pain, nausea, vomiting or bleeding.    Return precautions and treatment recommendations and follow-up discussed with the patient and her husband. Both who is agreeable with the plan.   Patient states that she has had fistulogram performed at outpatient dialysis group on [address]. Per EPIC no prior fistulograms. No pertinent imaging. Last dialysis session as on 12.2.4. Patient is on 81 mg of ASA. Marland Kitchen Allergies include Motrin. Patient has been NPO since midnight. IR previously performed a right chest wall biopsy on 5.7.24 cytology revealed metastatic adenocarcinoma.  Past Medical History:  Diagnosis Date   (HFpEF) heart failure with preserved ejection fraction (HCC)    a. 01/2019 Echo: EF 55-60%, mild conc LVH. DD.  Torn MV chordae.   Anemia    Atypical chest pain    a. 08/2018 MV: EF 59%, no ischemia; b. 02/2019 Cath: nonobs dzs.   Blood transfusion without reported diagnosis    Breast cancer (HCC) 10/12/2020   Cataract    ESRD (end stage renal disease) on dialysis [facility])    a. HD T, T, S   Essential hypertension, benign    GERD (gastroesophageal  reflux disease)    Headache    Hemorrhoids    Mixed hyperlipidemia    Morbid obesity (HCC)    Non-obstructive CAD (coronary artery disease)    a. 02/2019 CathL LM nl, LAD 67m, LCX nl, RCA 25p, EF 55-65%.   PONV (postoperative nausea and vomiting)    Renal insufficiency    S/P colonoscopy 08/2009   Dr. Elnoria Howard: sessile polyp (benign lymphoid), large hemorrhoids, repeat 5-10 years   Temporal arteritis (HCC)    Type 2 diabetes mellitus (HCC)    Wears glasses     Past Surgical History:  Procedure Laterality Date   ABDOMINAL HYSTERECTOMY     APPENDECTOMY     ARTERY BIOPSY N/A 05/09/2018   Procedure: RIGHT TEMPORAL ARTERY BIOPSY;  Surgeon: Jimmye Norman, MD;  Location: [facility] OR;  Service: General;  Laterality: N/A;   BREAST BIOPSY Right 06/15/2020   Procedure: RIGHT BREAST BIOPSY;  Surgeon: Franky Macho, MD;  Location: [facility] ORS;  Service: General;  Laterality: Right;   CATARACT EXTRACTION W/PHACO Left 02/09/2017   Procedure: CATARACT EXTRACTION PHACO AND INTRAOCULAR LENS PLACEMENT LEFT EYE;  Surgeon: Gemma Payor, MD;  Location: [facility];  Service: Ophthalmology;  Laterality: Left;  CDE: 4.89   CATARACT EXTRACTION W/PHACO Right 06/04/2017   Procedure: CATARACT EXTRACTION PHACO AND INTRAOCULAR LENS PLACEMENT (IOC);  Surgeon: Gemma Payor, MD;  Location: [facility];  Service: Ophthalmology;  Laterality: Right;  CDE: 4.12   CHOLECYSTECTOMY  09/29/2011   Procedure: LAPAROSCOPIC CHOLECYSTECTOMY;  Surgeon: Dalia Heading, MD;  Location: [facility];  Service: General;  Laterality:  N/A;   COLONOSCOPY  08/2009   Dr. Elnoria Howard: sessile polyp (benign lymphoid), large hemorrhoids, repeat 5-10 years   COLONOSCOPY N/A 06/12/2016   prominent hemorrhoids   COLONOSCOPY WITH PROPOFOL N/A 06/16/2021   Procedure: COLONOSCOPY WITH PROPOFOL;  Surgeon: Corbin Ade, MD;  Location: [facility];  Service: Endoscopy;  Laterality: N/A;  9:30am (dialysis pt)   ESOPHAGOGASTRODUODENOSCOPY  09/05/2011   WUJ:[id number] hiatal hernia; remainder  of exam normal. No explanation for patient's abdominal pain with today's examination   ESOPHAGOGASTRODUODENOSCOPY N/A 12/17/2013   Dr. Jena Gauss: gastric erythema, erosion, mild chronic inflammation on path    ESOPHAGOGASTRODUODENOSCOPY (EGD) WITH PROPOFOL N/A 06/16/2021   Procedure: ESOPHAGOGASTRODUODENOSCOPY (EGD) WITH PROPOFOL;  Surgeon: Corbin Ade, MD;  [facility];  Service: Endoscopy;  Laterality: N/A;   EXCISION OF BREAST BIOPSY Right 10/12/2020   Procedure: EXCISION OF RIGHT BREAST BIOPSY;  Surgeon: Franky Macho, MD;  Location: [facility];  Service: General;  Laterality: Right;   LAPAROSCOPIC APPENDECTOMY  09/29/2011   Procedure: APPENDECTOMY LAPAROSCOPIC;  Surgeon: Dalia Heading, MD;  Location: [facility];  Service: General;;  incidental appendectomy   LEFT HEART CATH AND CORONARY ANGIOGRAPHY N/A 02/28/2019   Procedure: LEFT HEART CATH AND CORONARY ANGIOGRAPHY;  Surgeon: Corky Crafts, MD;  Location: [facility] INVASIVE CV LAB;  Service: Cardiovascular;  Laterality: N/A;   MASS EXCISION Right 01/18/2023   Procedure: EXCISION MASS, RIGHT CHEST S/P MASTECTOMY;  Surgeon: Franky Macho, MD;  Location: [facility];  Service: General;  Laterality: Right;   MASTECTOMY MODIFIED RADICAL Right 02/18/2020   Procedure: MASTECTOMY MODIFIED RADICAL;  Surgeon: Franky Macho, MD;  Location: [facility];  Service: General;  Laterality: Right;   MASTECTOMY, PARTIAL Right 07/13/2020   Procedure: RIGHT PARTIAL MASTECTOMY;  Surgeon: Franky Macho, MD;  Location: [facility];  Service: General;  Laterality: Right;   PARTIAL MASTECTOMY WITH NEEDLE LOCALIZATION AND AXILLARY SENTINEL LYMPH NODE BX Right 09/18/2018   Procedure: RIGHT PARTIAL MASTECTOMY AFTER NEEDLE LOCALIZATION, SENTINEL LYMPH NODE BIOPSY RIGHT AXILLA;  Surgeon: Franky Macho, MD;  Location: [facility];  Service: General;  Laterality: Right;   POLYPECTOMY  06/16/2021   Procedure: POLYPECTOMY;  Surgeon: Corbin Ade, MD;  [facility];  Service:  Endoscopy;;   PORTACATH PLACEMENT Right 06/07/2023   Procedure: INSERTION PORT-A-CATH, RIGHT  (DIALYSIS ACCESS ON LEFT);  Surgeon: Franky Macho, MD;  Location: [facility];  Service: General;  Laterality: Right;   SIMPLE MASTECTOMY WITH AXILLARY SENTINEL NODE BIOPSY Left 06/15/2020   Procedure: LEFT SIMPLE MASTECTOMY;  Surgeon: Franky Macho, MD;  Location: [facility];  Service: General;  Laterality: Left;    Allergies: Motrin [ibuprofen]  Medications: Prior to Admission medications   Medication Sig Start Date End Date Taking? Authorizing Provider  acetaminophen (TYLENOL) 500 MG tablet Take 500-1,000 mg by mouth every 6 (six) hours as needed (pain.).    [provider]  amLODipine (NORVASC) 10 MG tablet TAKE 1 TABLET BY MOUTH AT BEDTIME 08/15/22   Donita Brooks, MD  anastrozole (ARIMIDEX) 1 MG tablet TAKE 1 TABLET BY MOUTH DAILY 01/16/23   [name], MD  aspirin EC 81 MG tablet Take 81 mg by mouth in the morning.    [provider]  atorvastatin (LIPITOR) 20 MG tablet TAKE 1 TABLET(20 MG) BY MOUTH DAILY 03/15/23   Donita Brooks, MD  B Complex-C-Zn-Folic Acid (DIALYVITE 800-ZINC 15) 0.8 MG TABS Take 1 tablet by mouth in the morning. 06/19/22   [provider]  cinacalcet (  SENSIPAR) 30 MG tablet Take 2 tablets (60 mg total) by mouth daily. Patient taking differently: Take 90 mg by mouth daily with breakfast. With a meal 06/13/18   Donita Brooks, MD  fluticasone [facility]) 50 MCG/ACT nasal spray Place 2 sprays into both nostrils daily. 10/20/22   Donita Brooks, MD  gabapentin (NEURONTIN) 300 MG capsule TAKE 1 CAPSULE(300 MG) BY MOUTH AT BEDTIME 06/12/23   Donita Brooks, MD  iron sucrose (VENOFER) 20 MG/ML injection Inject 50 mg into the vein once a week. 02/19/23 02/18/24  [provider]  isosorbide mononitrate (IMDUR) 30 MG 24 hr tablet Take 1 tablet (30 mg total) by mouth 2 (two) times daily. Patient taking differently: Take 30 mg by mouth daily.  04/17/23 07/16/23  Furth, Cadence H, PA-C  lanthanum (FOSRENOL) 1000 MG chewable tablet Chew 2,000-3,000 mg by mouth See admin instructions. Take 3 tablets (3000 mg) by mouth with meals and take 2 tablets (2000 mg) with snacks 10/16/19   [provider]  lidocaine-prilocaine (EMLA) cream Apply 1 application  topically every Monday, Wednesday, and Friday with hemodialysis. 09/24/18   [provider]  meclizine (ANTIVERT) 25 MG tablet Take 1 tablet (25 mg total) by mouth 3 (three) times daily as needed for dizziness. 06/12/23   Donita Brooks, MD  nebivolol (BYSTOLIC) 10 MG tablet TAKE 1 TABLET BY MOUTH IN THE MORNING AND AT BEDTIME 04/24/23   Donita Brooks, MD  nitroGLYCERIN (NITROSTAT) 0.4 MG SL tablet Place 1 tablet (0.4 mg total) under the tongue every 5 (five) minutes x 3 doses as needed for chest pain (if no relief after 3rd dose, proceed to ED or call 911). 09/13/22   Antoine Poche, MD  ondansetron (ZOFRAN) 4 MG tablet Take 2 tablets (8 mg total) by mouth every 8 (eight) hours as needed for nausea or vomiting. 07/05/23   Mauro Kaufmann, NP  oxyCODONE (ROXICODONE) 5 MG immediate release tablet Take 1 tablet (5 mg total) by mouth every 6 (six) hours as needed. 06/07/23 06/06/24  Franky Macho, MD  pantoprazole (PROTONIX) 40 MG tablet TAKE 1 TABLET(40 MG) BY MOUTH DAILY 06/12/23   Donita Brooks, MD  prochlorperazine (COMPAZINE) 10 MG tablet Take 1 tablet (10 mg total) by mouth every 6 (six) hours as needed for nausea or vomiting. 06/14/23   [name], MD  VITAMIN D PO Take 0.25 mcg by mouth daily. 02/02/23 02/01/24  [provider]     Family History  Problem Relation Age of Onset   Hypertension Mother    Coronary artery disease Mother    Diabetes Mother    Hypertension Sister    Coronary artery disease Sister    Hypertension Brother    Heart attack Father    Hypertension Son    Heart attack Maternal Aunt    Hypertension Maternal Aunt     Diabetes Maternal Aunt    Heart attack Maternal Uncle    Hypertension Maternal Uncle    Diabetes Maternal Uncle    Heart attack Paternal Aunt    Hypertension Paternal Aunt    Diabetes Paternal Aunt    Heart attack Paternal Uncle    Hypertension Paternal Uncle    Diabetes Paternal Uncle    Heart attack Maternal Grandmother    Heart attack Maternal Grandfather    Heart attack Paternal Grandmother    Heart attack Paternal Grandfather    Colon cancer Neg Hx     Social History   Socioeconomic History  Marital status: Married    Spouse name: Not on file   Number of children: Not on file   Years of education: Not on file   Highest education level: 12th grade  Occupational History   Occupation: Systems developer: [employer]  Tobacco Use   Smoking status: Never    Passive exposure: Never   Smokeless tobacco: Never  Vaping Use   Vaping status: Never Used  Substance and Sexual Activity   Alcohol use: No   Drug use: No   Sexual activity: Yes    Birth control/protection: Surgical  Other Topics Concern   Not on file  Social History Narrative   Works at [employer] in [place].    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   Also has to get items from high shelves-causes achy pain in shoulder area      Married.   Children are grown, out of house.   Social Determinants of Health   Financial Resource Strain: Low Risk  (06/08/2023)   Overall Financial Resource Strain (CARDIA)    Difficulty of Paying Living Expenses: Not hard at all  Food Insecurity: No Food Insecurity (06/08/2023)   Hunger Vital Sign    Worried About Running Out of Food in the Last Year: Never true    Ran Out of Food in the Last Year: Never true  Transportation Needs: No Transportation Needs (06/08/2023)   PRAPARE - Administrator, Civil Service (Medical): No    Lack of Transportation (Non-Medical): No  Physical Activity: Inactive (06/08/2023)   Exercise Vital Sign    Days of  Exercise per Week: 0 days    Minutes of Exercise per Session: 0 min  Stress: No Stress Concern Present (06/08/2023)   [employer] of Occupational Health - Occupational Stress Questionnaire    Feeling of Stress : Not at all  Social Connections: Socially Integrated (06/08/2023)   Social Connection and Isolation Panel [NHANES]    Frequency of Communication with Friends and Family: More than three times a week    Frequency of Social Gatherings with Friends and Family: Once a week    Attends Religious Services: More than 4 times per year    Active Member of [employer] or Organizations: No    Attends Engineer, structural: More than 4 times per year    Marital Status: Married      Review of Systems: A 12 point ROS discussed and pertinent positives are indicated in the HPI above.  All other systems are negative.  Review of Systems  Constitutional:  Negative for fatigue and fever.  HENT:  Negative for congestion.   Respiratory:  Negative for cough and shortness of breath.   Gastrointestinal:  Negative for abdominal pain, diarrhea, nausea and vomiting.  Musculoskeletal:        Left arm swelling and finger numbness    Vital Signs: BP 138/76   Pulse 81   Temp 98.2 F (36.8 C) (Oral)   Resp 17   Ht 5\' 2"  (1.575 m)   Wt 174 lb (78.9 kg)   SpO2 100%   BMI 31.83 kg/m     Physical Exam Vitals and nursing note reviewed.  Constitutional:      Appearance: She is well-developed.  HENT:     Head: Normocephalic and atraumatic.  Eyes:     Conjunctiva/sclera: Conjunctivae normal.  Cardiovascular:     Rate and Rhythm: Normal rate and regular rhythm.  Comments: Left forearm AVF +/+ Pulmonary:     Effort: Pulmonary effort is normal.  Musculoskeletal:        General: Normal range of motion.     Cervical back: Normal range of motion.  Skin:    General: Skin is warm.  Neurological:     General: No focal deficit present.     Mental Status: She is alert and oriented to  person, place, and time.  Psychiatric:        Mood and Affect: Mood normal.        Behavior: Behavior normal.        Thought Content: Thought content normal.        Judgment: Judgment normal.     Imaging: No results found.  Labs:  CBC: Recent Labs    06/20/23 2137 06/25/23 1517 06/28/23 0925 07/05/23 0955  WBC 2.4* 6.6 4.8 6.2  HGB 9.1* 8.8* 8.0* 8.9*  HCT 28.1* 27.2* 25.7* 27.6*  PLT 157 245 208 247    COAGS: Recent Labs    12/19/22 0925  INR 1.1    BMP: Recent Labs    06/20/23 2137 06/25/23 1517 06/28/23 0924 07/05/23 0955  NA 136 135 139 139  K 4.2 3.2* 3.5 4.1  CL 94* 94* 96* 106  CO2 29 32 29 21*  GLUCOSE 108* 96 98 133*  BUN 44* 18 26* 28*  CALCIUM 9.4 9.0 9.3 10.0  CREATININE 10.04* 7.70* 8.90* 8.98*  GFRNONAA 4* 6* 5* 5*    LIVER FUNCTION TESTS: Recent Labs    06/20/23 2137 06/25/23 1517 06/28/23 0924 07/05/23 0955  BILITOT 0.4 0.4 0.4 0.5  AST 19 19 16 18   ALT 20 18 16 22   ALKPHOS 102 98 109 146*  PROT 6.9 6.8 6.5 7.1  ALBUMIN 3.1* 3.2* 3.3* 3.5     Assessment and Plan:  62 y.o. female outpatient. History of DM, recurrent breast cancer (right) s/p mastectomy, ESRD was on PD changed HD in 2018  via a left forearm radial cephalic AVF, Now with left arm swelling and finger numbness. Patient states the swelling has been present since December 2023 when her AVF was infiltrated.Team is requesting fistulogram with possible intervention.   PLAN: IR fistulogram with possible intervention  Risks and benefits discussed with the patient including, but not limited to bleeding, infection, vascular injury, pulmonary embolism, need for tunneled HD catheter placement or even death.  All of the patient's questions were answered, patient is agreeable to proceed. Consent signed and in chart.  Thank you for this interesting consult.  I greatly enjoyed meeting Megan [name] and look forward to participating in their care.  A copy of this report  was sent to the requesting provider on this date.  Electronically Signed: Alene Mires, NP 07/17/2023, 11:58 AM   I spent a total of  30 Minutes   in face to face in clinical consultation, greater than 50% of which was counseling/coordinating care for fistulogram with possible intervention

## 2023-07-17 NOTE — Sedation Documentation (Signed)
Per MD Henn, no intervention will be done at this time. See MD notes. Patient verbalizes understanding.

## 2023-07-17 NOTE — Procedures (Signed)
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  No intervention.   Complications: None     EBL: Minimal  Plan: Discharge to home.   Phoebe Marter R. Lowella Dandy, MD  Pager: [phone]

## 2023-07-18 ENCOUNTER — Inpatient Hospital Stay: Payer: Medicare Other | Attending: Hematology

## 2023-07-18 DIAGNOSIS — D649 Anemia, unspecified: Secondary | ICD-10-CM | POA: Insufficient documentation

## 2023-07-18 DIAGNOSIS — E1122 Type 2 diabetes mellitus with diabetic chronic kidney disease: Secondary | ICD-10-CM | POA: Diagnosis not present

## 2023-07-18 DIAGNOSIS — D631 Anemia in chronic kidney disease: Secondary | ICD-10-CM | POA: Diagnosis not present

## 2023-07-18 DIAGNOSIS — Z5189 Encounter for other specified aftercare: Secondary | ICD-10-CM | POA: Diagnosis not present

## 2023-07-18 DIAGNOSIS — E1129 Type 2 diabetes mellitus with other diabetic kidney complication: Secondary | ICD-10-CM | POA: Diagnosis not present

## 2023-07-18 DIAGNOSIS — E1140 Type 2 diabetes mellitus with diabetic neuropathy, unspecified: Secondary | ICD-10-CM | POA: Diagnosis not present

## 2023-07-18 DIAGNOSIS — R0789 Other chest pain: Secondary | ICD-10-CM | POA: Insufficient documentation

## 2023-07-18 DIAGNOSIS — E782 Mixed hyperlipidemia: Secondary | ICD-10-CM | POA: Insufficient documentation

## 2023-07-18 DIAGNOSIS — M8580 Other specified disorders of bone density and structure, unspecified site: Secondary | ICD-10-CM | POA: Insufficient documentation

## 2023-07-18 DIAGNOSIS — Z79899 Other long term (current) drug therapy: Secondary | ICD-10-CM | POA: Diagnosis not present

## 2023-07-18 DIAGNOSIS — R748 Abnormal levels of other serum enzymes: Secondary | ICD-10-CM | POA: Diagnosis not present

## 2023-07-18 DIAGNOSIS — Z992 Dependence on renal dialysis: Secondary | ICD-10-CM | POA: Diagnosis not present

## 2023-07-18 DIAGNOSIS — E119 Type 2 diabetes mellitus without complications: Secondary | ICD-10-CM | POA: Diagnosis not present

## 2023-07-18 DIAGNOSIS — K219 Gastro-esophageal reflux disease without esophagitis: Secondary | ICD-10-CM | POA: Diagnosis not present

## 2023-07-18 DIAGNOSIS — K921 Melena: Secondary | ICD-10-CM

## 2023-07-18 DIAGNOSIS — I132 Hypertensive heart and chronic kidney disease with heart failure and with stage 5 chronic kidney disease, or end stage renal disease: Secondary | ICD-10-CM | POA: Insufficient documentation

## 2023-07-18 DIAGNOSIS — Z5111 Encounter for antineoplastic chemotherapy: Secondary | ICD-10-CM | POA: Insufficient documentation

## 2023-07-18 DIAGNOSIS — Z79811 Long term (current) use of aromatase inhibitors: Secondary | ICD-10-CM | POA: Diagnosis not present

## 2023-07-18 DIAGNOSIS — Z9013 Acquired absence of bilateral breasts and nipples: Secondary | ICD-10-CM | POA: Diagnosis not present

## 2023-07-18 DIAGNOSIS — Z923 Personal history of irradiation: Secondary | ICD-10-CM | POA: Insufficient documentation

## 2023-07-18 DIAGNOSIS — C50911 Malignant neoplasm of unspecified site of right female breast: Secondary | ICD-10-CM

## 2023-07-18 DIAGNOSIS — Z170 Estrogen receptor positive status [ER+]: Secondary | ICD-10-CM | POA: Diagnosis not present

## 2023-07-18 DIAGNOSIS — E1136 Type 2 diabetes mellitus with diabetic cataract: Secondary | ICD-10-CM | POA: Insufficient documentation

## 2023-07-18 DIAGNOSIS — C50211 Malignant neoplasm of upper-inner quadrant of right female breast: Secondary | ICD-10-CM | POA: Diagnosis not present

## 2023-07-18 DIAGNOSIS — I5032 Chronic diastolic (congestive) heart failure: Secondary | ICD-10-CM | POA: Diagnosis not present

## 2023-07-18 DIAGNOSIS — N2581 Secondary hyperparathyroidism of renal origin: Secondary | ICD-10-CM | POA: Diagnosis not present

## 2023-07-18 DIAGNOSIS — N186 End stage renal disease: Secondary | ICD-10-CM | POA: Diagnosis not present

## 2023-07-18 LAB — CBC WITH DIFFERENTIAL/PLATELET
Abs Immature Granulocytes: 0.09 10*3/uL — ABNORMAL HIGH (ref 0.00–0.07)
Basophils Absolute: 0.1 10*3/uL (ref 0.0–0.1)
Basophils Relative: 1 %
Eosinophils Absolute: 0 10*3/uL (ref 0.0–0.5)
Eosinophils Relative: 0 %
HCT: 27 % — ABNORMAL LOW (ref 36.0–46.0)
Hemoglobin: 8.4 g/dL — ABNORMAL LOW (ref 12.0–15.0)
Immature Granulocytes: 1 %
Lymphocytes Relative: 14 %
Lymphs Abs: 1.2 10*3/uL (ref 0.7–4.0)
MCH: 34 pg (ref 26.0–34.0)
MCHC: 31.1 g/dL (ref 30.0–36.0)
MCV: 109.3 fL — ABNORMAL HIGH (ref 80.0–100.0)
Monocytes Absolute: 0.7 10*3/uL (ref 0.1–1.0)
Monocytes Relative: 9 %
Neutro Abs: 6.3 10*3/uL (ref 1.7–7.7)
Neutrophils Relative %: 75 %
Platelets: 202 10*3/uL (ref 150–400)
RBC: 2.47 MIL/uL — ABNORMAL LOW (ref 3.87–5.11)
RDW: 16.9 % — ABNORMAL HIGH (ref 11.5–15.5)
WBC: 8.4 10*3/uL (ref 4.0–10.5)
nRBC: 0 % (ref 0.0–0.2)

## 2023-07-18 LAB — APTT: aPTT: 23 s — ABNORMAL LOW (ref 24–36)

## 2023-07-18 LAB — PROTIME-INR
INR: 1 (ref 0.8–1.2)
Prothrombin Time: 13.3 s (ref 11.4–15.2)

## 2023-07-19 ENCOUNTER — Other Ambulatory Visit: Payer: Medicare Other

## 2023-07-20 DIAGNOSIS — E119 Type 2 diabetes mellitus without complications: Secondary | ICD-10-CM | POA: Diagnosis not present

## 2023-07-20 DIAGNOSIS — N2581 Secondary hyperparathyroidism of renal origin: Secondary | ICD-10-CM | POA: Diagnosis not present

## 2023-07-20 DIAGNOSIS — D631 Anemia in chronic kidney disease: Secondary | ICD-10-CM | POA: Diagnosis not present

## 2023-07-20 DIAGNOSIS — Z992 Dependence on renal dialysis: Secondary | ICD-10-CM | POA: Diagnosis not present

## 2023-07-20 DIAGNOSIS — E1129 Type 2 diabetes mellitus with other diabetic kidney complication: Secondary | ICD-10-CM | POA: Diagnosis not present

## 2023-07-20 DIAGNOSIS — N186 End stage renal disease: Secondary | ICD-10-CM | POA: Diagnosis not present

## 2023-07-23 DIAGNOSIS — E1129 Type 2 diabetes mellitus with other diabetic kidney complication: Secondary | ICD-10-CM | POA: Diagnosis not present

## 2023-07-23 DIAGNOSIS — D631 Anemia in chronic kidney disease: Secondary | ICD-10-CM | POA: Diagnosis not present

## 2023-07-23 DIAGNOSIS — Z992 Dependence on renal dialysis: Secondary | ICD-10-CM | POA: Diagnosis not present

## 2023-07-23 DIAGNOSIS — N2581 Secondary hyperparathyroidism of renal origin: Secondary | ICD-10-CM | POA: Diagnosis not present

## 2023-07-23 DIAGNOSIS — E119 Type 2 diabetes mellitus without complications: Secondary | ICD-10-CM | POA: Diagnosis not present

## 2023-07-23 DIAGNOSIS — N186 End stage renal disease: Secondary | ICD-10-CM | POA: Diagnosis not present

## 2023-07-25 DIAGNOSIS — N2581 Secondary hyperparathyroidism of renal origin: Secondary | ICD-10-CM | POA: Diagnosis not present

## 2023-07-25 DIAGNOSIS — E1129 Type 2 diabetes mellitus with other diabetic kidney complication: Secondary | ICD-10-CM | POA: Diagnosis not present

## 2023-07-25 DIAGNOSIS — Z992 Dependence on renal dialysis: Secondary | ICD-10-CM | POA: Diagnosis not present

## 2023-07-25 DIAGNOSIS — E119 Type 2 diabetes mellitus without complications: Secondary | ICD-10-CM | POA: Diagnosis not present

## 2023-07-25 DIAGNOSIS — D631 Anemia in chronic kidney disease: Secondary | ICD-10-CM | POA: Diagnosis not present

## 2023-07-25 DIAGNOSIS — N186 End stage renal disease: Secondary | ICD-10-CM | POA: Diagnosis not present

## 2023-07-25 NOTE — Progress Notes (Signed)
[facility] [address] [place] [address]    Clinic Day:  07/26/23   Referring physician: Donita Brooks, MD  Patient Care Team: Donita Brooks, MD as PCP - General (Family Medicine) [name] [name], MD as PCP - Cardiology (Cardiology) [name], MD (Gastroenterology) [name], MD as Consulting Physician (Cardiology) [name], MD as Medical Oncologist (Medical Oncology) [name], MD as Consulting Physician (Nephrology)   ASSESSMENT & PLAN:   Assessment: 1.  Stage Ib (T1CN0) right breast cancer, ER positive, PR and HER-2 negative: -Right lumpectomy and sentinel lymph node biopsy on 09/18/2018 shows 1.5 cm IDC, grade 3, associated with high-grade DCIS, negative margins, 0/3 lymph nodes positive, ER 50%, PR negative and HER-2 negative, Ki-67 60%. -Oncotype DX recurrence score 47.  Distant recurrence at 9 years with tamoxifen alone is 36%.  Absolute chemotherapy benefit is more than 15%. -Adjuvant chemotherapy with 4 cycles of AC from 10/30/2018 through 01/01/2019. -She was started on anastrozole in June 2020. -Mammogram on 09/02/2019 showed calcifications in the posterior aspect of upper outer quadrant of the right breast. -Right partial mastectomy on 02/18/2020 shows high-grade DCIS, 1.5 cm, no invasive carcinoma, resection margins negative.  0/11 lymph nodes.  ER 30% positive, PR negative. -Left simple mastectomy on 06/15/2020 was benign.  Right breast biopsy shows microscopic focus of invasive ductal carcinoma in the background of extensive high-grade DCIS. -Right breast lumpectomy on 07/13/2020 shows multifocal invasive ductal carcinoma, grade 3, largest measuring 1.2 cm.  Extensive high-grade DCIS with necrosis.  Margins are free.  PT1CNX.  There are multiple foci of invasive carcinoma arising from extensive DCIS.  Several small foci of invasive tumor arising from DCIS indicating new primaries.  ER/PR/HER-2 is negative.   Ki-67 is 20%. -PET scan on 09/14/2020 shows areas of nodularity with discrete nodule in the fat of the inferior chest wall within the subcutaneous tissues. -Ultrasound of the right chest wall shows masslike abnormality in the upper inner aspect of the right anterior chest at 1 o'clock position measuring 4.1 cm in greatest dimension.  1.7 cm hypoechoic mass in the central anterior right chest concerning for malignancy.  Possible borderline enlarged residual right axillary lymph node. -Right breast biopsy on 10/12/2020 shows 1.3 cm invasive ductal carcinoma, focally 0.1 cm from superior margin.  DCIS is less than 0.1 cm from superior margin.  PT1CPNX. - Right mastectomy followed by radiation therapy completed in May 2022. - PET scan (11/30/2022): Recurrent/metastatic disease within the right medial pectoralis musculature. - Biopsy (12/19/2022): Metastatic breast adenocarcinoma.  ER positive, 40%, weak staining.  PR negative.  Ki-67 30%.  HER2 IHC 0. - Right chest wall mass resection on 01/18/2023.  Pathology shows 1.7 cm IDC, grade 3.  DCIS with high-grade with necrosis.  Carcinoma involves inked inferior margin with focally invading into atrophic skeletal muscle. - Her case was discussed at multidisciplinary clinic at [facility] on 04/18/2023. - Surgical oncology felt that resection margins are adequate and recommended radiation therapy.  She was also evaluated by medical oncology (Dr. Iona Hansen) who has recommended systemic therapy with 6 cycles of docetaxel, carboplatin and pembrolizumab. - XRT from 05/07/2023 through 05/25/2023. - Cycle 1 carboplatin, docetaxel and pembrolizumab on 06/14/2023  2.  Osteopenia: -Bone density on 01/20/2019 shows T score -1.9. - DEXA scan (03/24/2021) T score -2.4  3.  ESRD on HD: - She is on HD on Monday/Wednesday/Friday under the direction of Dr. Arrie Aran.    Plan: 1.  Recurrent right breast cancer, ER 40%, weak staining, PR negative, HER2 negative: - She received cycle 2  of carboplatin, docetaxel and pembrolizumab on 07/05/2023. - She did not have any GI side effects. - Labs today: Normal LFTs with mildly elevated alk phos.  CBC grossly normal with anemia from myelosuppression with hemoglobin 7.9. - Recommend 1 unit PRBC. - Recommend proceeding with cycle 3 today with same doses which are already dose reduced.  RTC 3 weeks for follow-up.   2.  Hypertension: - She is on Norvasc 10 mg and Bystolic 10 mg daily. - She reportedly passed out x 2 during dialysis. - She was told to check her blood pressures at home.  If the systolic is less than 100, recommend holding Norvasc.   3.  Neuropathy: - Neuropathy in the feet has been stable with no worsening since cycle 2.   4.  Right chest wall pain: - Continue oxycodone 5 mg as needed.  She has not required in the last few days.  She is taking Tylenol as needed.     Orders Placed This Encounter  Procedures   Informed Consent Details: Physician/Practitioner Attestation; Transcribe to consent form and obtain patient signature    Standing Status:   Standing    Number of Occurrences:   1    Physician/Practitioner attestation of informed consent for blood and or blood product transfusion:   I, the physician/practitioner, attest that I have discussed with the patient the benefits, risks, side effects, alternatives, likelihood of achieving goals and potential problems during recovery for the procedure that I have provided informed consent.    Product(s):   All Product(s)      I,[name],acting as a scribe for [name], MD.,have documented all relevant documentation on the behalf of [name], MD,as directed by  [name], MD while in the presence of [name], MD.  I, [name] MD, have reviewed the above documentation for accuracy and completeness, and I agree with the above.       [name], MD   12/12/20245:41 PM  CHIEF COMPLAINT:    Diagnosis: recurrent right breast cancer    Cancer Staging  Malignant neoplasm of upper-inner quadrant of right female breast [facility]) Staging form: Breast, AJCC 8th Edition - Clinical stage from 09/26/2018: Stage IB (cT1c, cN0, cM0, G3, ER+, PR-, HER2-) - Signed by [name], MD on 09/26/2018    Prior Therapy: 1. Right lumpectomy and sentinel lymph node biopsy on 09/18/2018  2. Adjuvant chemotherapy with 4 cycles of AC from 10/30/2018 through 01/01/2019  3. Right partial mastectomy on 02/18/2020  4. Right breast lumpectomy on 07/13/2020  5. Right mastectomy followed by radiation therapy completed in May 2022 6.  Anastrozole started in June 2020  Current Therapy: Carboplatin, docetaxel, pembrolizumab   HISTORY OF PRESENT ILLNESS:   Oncology History  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)  09/18/2018 Initial Diagnosis   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)   09/26/2018 Cancer Staging   Staging form: Breast, AJCC 8th Edition - Clinical stage from 09/26/2018: Stage IB (cT1c, cN0, cM0, G3, ER+, PR-, HER2-) - Signed by [name], MD on 09/26/2018   10/30/2018 - 01/01/2019 Chemotherapy   Patient is on Treatment Plan : BREAST Adjuvant AC q21d     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 female presenting to clinic today for follow up of recurrent right breast cancer. She was last seen by me on 06/28/23.  Today, she states that she is doing well overall. Her appetite level is at 70%. Her energy level is at 75%. She is accompanied by her husband.   She denies any side effects after her last treatment and injection. She reports her blood pressure  dropping a few times during dialysis in which she lost consciousness briefly with around 30 minutes of dialysis left. She occasionally measures her blood pressure at home. She was not given any blood transfusions during dialysis in the last 3 weeks. She denies any skin rashes and diarrhea. She notes a normal appetite and taste. Her neuropathy is stable. She has not taken oxycodone since last week and is instead taking OTC tylenol since chest pain has improved.  She notes occasional sharp pain at her port site, and denies any erythema around port site, fevers, or chills. She is taking amlodipine and bystolic as prescribed.   PAST MEDICAL HISTORY:   Past Medical History: Past Medical History:  Diagnosis Date   (HFpEF) heart failure with preserved ejection fraction (HCC)    a. 01/2019 Echo: EF 55-60%, mild conc LVH. DD.  Torn MV chordae.   Anemia    Atypical chest pain    a. 08/2018 MV: EF 59%, no ischemia; b. 02/2019 Cath: nonobs dzs.   Blood transfusion without reported diagnosis    Breast cancer (HCC) 10/12/2020   Cataract    ESRD (end stage renal disease) on dialysis [facility])    a. HD T, T, S   Essential hypertension, benign    GERD (gastroesophageal reflux disease)    Headache    Hemorrhoids    Mixed hyperlipidemia    Morbid obesity (HCC)    Non-obstructive CAD (coronary artery disease)    a. 02/2019 CathL LM nl, LAD 55m, LCX nl, RCA 25p, EF 55-65%.   PONV (postoperative nausea and vomiting)    Renal insufficiency    S/P colonoscopy 08/2009   Dr. Elnoria Howard: sessile polyp (benign lymphoid), large hemorrhoids, repeat 5-10 years   Temporal arteritis (HCC)    Type 2 diabetes mellitus (HCC)    Wears glasses     Surgical History: Past Surgical History:  Procedure Laterality Date   ABDOMINAL HYSTERECTOMY     APPENDECTOMY     ARTERY BIOPSY N/A 05/09/2018   Procedure: RIGHT TEMPORAL ARTERY BIOPSY;  Surgeon: Jimmye Norman, MD;  Location[facility] OR;  Service: General;  Laterality: N/A;   BREAST BIOPSY  Right 06/15/2020   Procedure: RIGHT BREAST BIOPSY;  Surgeon: Franky Macho, MD;  Location: [facility] ORS;  Service: General;  Laterality: Right;   CATARACT EXTRACTION W/PHACO Left 02/09/2017   Procedure: CATARACT EXTRACTION PHACO AND INTRAOCULAR LENS PLACEMENT LEFT EYE;  Surgeon: Gemma Payor, MD;  Location: [facility] ORS;  Service: Ophthalmology;  Laterality: Left;  CDE: 4.89   CATARACT EXTRACTION W/PHACO Right 06/04/2017   Procedure: CATARACT EXTRACTION PHACO AND INTRAOCULAR LENS PLACEMENT (IOC);  Surgeon: Gemma Payor, MD;  Location: [facility] ORS;  Service: Ophthalmology;  Laterality: Right;  CDE: 4.12   CHOLECYSTECTOMY  09/29/2011   Procedure: LAPAROSCOPIC CHOLECYSTECTOMY;  Surgeon: Dalia Heading, MD;  Location: [facility] ORS;  Service: General;  Laterality: N/A;   COLONOSCOPY  08/2009   Dr. Elnoria Howard: sessile polyp (  benign lymphoid), large hemorrhoids, repeat 5-10 years   COLONOSCOPY N/A 06/12/2016   prominent hemorrhoids   COLONOSCOPY WITH PROPOFOL N/A 06/16/2021   Procedure: COLONOSCOPY WITH PROPOFOL;  Surgeon: Corbin Ade, MD;  Location: [facility];  Service: Endoscopy;  Laterality: N/A;  9:30am (dialysis pt)   ESOPHAGOGASTRODUODENOSCOPY  09/05/2011   [id number] hiatal hernia; remainder of exam normal. No explanation for patient's abdominal pain with today's examination   ESOPHAGOGASTRODUODENOSCOPY N/A 12/17/2013   Dr. Jena Gauss: gastric erythema, erosion, mild chronic inflammation on path    ESOPHAGOGASTRODUODENOSCOPY (EGD) WITH PROPOFOL N/A 06/16/2021   Procedure: ESOPHAGOGASTRODUODENOSCOPY (EGD) WITH PROPOFOL;  Surgeon: Corbin Ade, MD;  Location: [facility];  Service: Endoscopy;  Laterality: N/A;   EXCISION OF BREAST BIOPSY Right 10/12/2020   Procedure: EXCISION OF RIGHT BREAST BIOPSY;  Surgeon: Franky Macho, MD;  Location: [facility];  Service: General;  Laterality: Right;   IR DIALY SHUNT INTRO NEEDLE/INTRACATH INITIAL W/IMG LEFT Left 07/17/2023   LAPAROSCOPIC APPENDECTOMY  09/29/2011   Procedure:  APPENDECTOMY LAPAROSCOPIC;  Surgeon: Dalia Heading, MD;  Location: [facility];  Service: General;;  incidental appendectomy   LEFT HEART CATH AND CORONARY ANGIOGRAPHY N/A 02/28/2019   Procedure: LEFT HEART CATH AND CORONARY ANGIOGRAPHY;  Surgeon: Corky Crafts, MD;  Location: [facility] INVASIVE CV LAB;  Service: Cardiovascular;  Laterality: N/A;   MASS EXCISION Right 01/18/2023   Procedure: EXCISION MASS, RIGHT CHEST S/P MASTECTOMY;  Surgeon: Franky Macho, MD;  Location: [facility];  Service: General;  Laterality: Right;   MASTECTOMY MODIFIED RADICAL Right 02/18/2020   Procedure: MASTECTOMY MODIFIED RADICAL;  Surgeon: Franky Macho, MD;  Location: [facility];  Service: General;  Laterality: Right;   MASTECTOMY, PARTIAL Right 07/13/2020   Procedure: RIGHT PARTIAL MASTECTOMY;  Surgeon: Franky Macho, MD;  Location: [facility];  Service: General;  Laterality: Right;   PARTIAL MASTECTOMY WITH NEEDLE LOCALIZATION AND AXILLARY SENTINEL LYMPH NODE BX Right 09/18/2018   Procedure: RIGHT PARTIAL MASTECTOMY AFTER NEEDLE LOCALIZATION, SENTINEL LYMPH NODE BIOPSY RIGHT AXILLA;  Surgeon: Franky Macho, MD;  Location: [facility];  Service: General;  Laterality: Right;   POLYPECTOMY  06/16/2021   Procedure: POLYPECTOMY;  Surgeon: Corbin Ade, MD;  Location: [facility];  Service: Endoscopy;;   PORTACATH PLACEMENT Right 06/07/2023   Procedure: INSERTION PORT-A-CATH, RIGHT  (DIALYSIS ACCESS ON LEFT);  Surgeon: Franky Macho, MD;  Location: [facility];  Service: General;  Laterality: Right;   SIMPLE MASTECTOMY WITH AXILLARY SENTINEL NODE BIOPSY Left 06/15/2020   Procedure: LEFT SIMPLE MASTECTOMY;  Surgeon: Franky Macho, MD;  Location: [facility];  Service: General;  Laterality: Left;    Social History: Social History   Socioeconomic History   Marital status: Married    Spouse name: Not on file   Number of children: Not on file   Years of education: Not on file   Highest education level: 12th grade  Occupational History    Occupation: Systems developer: [employer]  Tobacco Use   Smoking status: Never    Passive exposure: Never   Smokeless tobacco: Never  Vaping Use   Vaping status: Never Used  Substance and Sexual Activity   Alcohol use: No   Drug use: No   Sexual activity: Yes    Birth control/protection: Surgical  Other Topics Concern   Not on file  Social History Narrative   Works at [employer] in [place].    When trucks come, she has to put items in their  places.   Also has to get items from high shelves-causes achy pain in shoulder area      Married.   Children are grown, out of house.   Social Drivers of Corporate investment banker Strain: Low Risk  (06/08/2023)   Overall Financial Resource Strain (CARDIA)    Difficulty of Paying Living Expenses: Not hard at all  Food Insecurity: No Food Insecurity (06/08/2023)   Hunger Vital Sign    Worried About Running Out of Food in the Last Year: Never true    Ran Out of Food in the Last Year: Never true  Transportation Needs: No Transportation Needs (06/08/2023)   PRAPARE - Administrator, Civil Service (Medical): No    Lack of Transportation (Non-Medical): No  Physical Activity: Inactive (06/08/2023)   Exercise Vital Sign    Days of Exercise per Week: 0 days    Minutes of Exercise per Session: 0 min  Stress: No Stress Concern Present (06/08/2023)   [employer] of Occupational Health - Occupational Stress Questionnaire    Feeling of Stress : Not at all  Social Connections: Socially Integrated (06/08/2023)   Social Connection and Isolation Panel [NHANES]    Frequency of Communication with Friends and Family: More than three times a week    Frequency of Social Gatherings with Friends and Family: Once a week    Attends Religious Services: More than 4 times per year    Active Member of [employer] or Organizations: No    Attends Engineer, structural: More than 4 times per year    Marital Status: Married  Careers information officer Violence: Not At Risk (09/14/2022)   Humiliation, Afraid, Rape, and Kick questionnaire    Fear of Current or Ex-Partner: No    Emotionally Abused: No    Physically Abused: No    Sexually Abused: No    Family History: Family History  Problem Relation Age of Onset   Hypertension Mother    Coronary artery disease Mother    Diabetes Mother    Hypertension Sister    Coronary artery disease Sister    Hypertension Brother    Heart attack Father    Hypertension Son    Heart attack Maternal Aunt    Hypertension Maternal Aunt    Diabetes Maternal Aunt    Heart attack Maternal Uncle    Hypertension Maternal Uncle    Diabetes Maternal Uncle    Heart attack Paternal Aunt    Hypertension Paternal Aunt    Diabetes Paternal Aunt    Heart attack Paternal Uncle    Hypertension Paternal Uncle    Diabetes Paternal Uncle    Heart attack Maternal Grandmother    Heart attack Maternal Grandfather    Heart attack Paternal Grandmother    Heart attack Paternal Grandfather    Colon cancer Neg Hx     Current Medications:  Current Outpatient Medications:    acetaminophen (TYLENOL) 500 MG tablet, Take 500-1,000 mg by mouth every 6 (six) hours as needed (pain.)., Disp: , Rfl:    amLODipine (NORVASC) 10 MG tablet, TAKE 1 TABLET BY MOUTH AT BEDTIME, Disp: 90 tablet, Rfl: 3   anastrozole (ARIMIDEX) 1 MG tablet, TAKE 1 TABLET BY MOUTH DAILY, Disp: 90 tablet, Rfl: 3   aspirin EC 81 MG tablet, Take 81 mg by mouth in the morning., Disp: , Rfl:    atorvastatin (LIPITOR) 20 MG tablet, TAKE 1 TABLET(20 MG) BY MOUTH DAILY, Disp: 90 tablet, Rfl: 0   B  Complex-C-Zn-Folic Acid (DIALYVITE 800-ZINC 15) 0.8 MG TABS, Take 1 tablet by mouth in the morning., Disp: , Rfl:    cinacalcet (SENSIPAR) 30 MG tablet, Take 2 tablets (60 mg total) by mouth daily. (Patient taking differently: Take 90 mg by mouth daily with breakfast. With a meal), Disp: 90 tablet, Rfl: 0   Darbepoetin Alfa (ARANESP, ALBUMIN FREE, IJ),  Darbepoetin Alfa (Aranesp), Disp: , Rfl:    fluticasone (FLONASE) 50 MCG/ACT nasal spray, Place 2 sprays into both nostrils daily., Disp: 16 g, Rfl: 6   gabapentin (NEURONTIN) 300 MG capsule, TAKE 1 CAPSULE(300 MG) BY MOUTH AT BEDTIME, Disp: 90 capsule, Rfl: 3   iron sucrose (VENOFER) 20 MG/ML injection, Inject 50 mg into the vein once a week., Disp: , Rfl:    lanthanum (FOSRENOL) 1000 MG chewable tablet, Chew 2,000-3,000 mg by mouth See admin instructions. Take 3 tablets (3000 mg) by mouth with meals and take 2 tablets (2000 mg) with snacks, Disp: , Rfl:    lidocaine-prilocaine (EMLA) cream, Apply 1 application  topically every Monday, Wednesday, and Friday with hemodialysis., Disp: , Rfl:    meclizine (ANTIVERT) 25 MG tablet, Take 1 tablet (25 mg total) by mouth 3 (three) times daily as needed for dizziness., Disp: 30 tablet, Rfl: 1   nebivolol (BYSTOLIC) 10 MG tablet, TAKE 1 TABLET BY MOUTH IN THE MORNING AND AT BEDTIME, Disp: 180 tablet, Rfl: 0   nitroGLYCERIN (NITROSTAT) 0.4 MG SL tablet, Place 1 tablet (0.4 mg total) under the tongue every 5 (five) minutes x 3 doses as needed for chest pain (if no relief after 3rd dose, proceed to ED or call 911)., Disp: 25 tablet, Rfl: 3   ondansetron (ZOFRAN) 4 MG tablet, Take 2 tablets (8 mg total) by mouth every 8 (eight) hours as needed for nausea or vomiting., Disp: 90 tablet, Rfl: 0   oxyCODONE (ROXICODONE) 5 MG immediate release tablet, Take 1 tablet (5 mg total) by mouth every 6 (six) hours as needed., Disp: 30 tablet, Rfl: 0   pantoprazole (PROTONIX) 40 MG tablet, TAKE 1 TABLET(40 MG) BY MOUTH DAILY, Disp: 90 tablet, Rfl: 3   prochlorperazine (COMPAZINE) 10 MG tablet, Take 1 tablet (10 mg total) by mouth every 6 (six) hours as needed for nausea or vomiting., Disp: 60 tablet, Rfl: 3   VITAMIN D PO, Take 0.25 mcg by mouth daily., Disp: , Rfl:    isosorbide mononitrate (IMDUR) 30 MG 24 hr tablet, Take 1 tablet (30 mg total) by mouth 2 (two) times daily.  (Patient taking differently: Take 30 mg by mouth daily.), Disp: 180 tablet, Rfl: 3 No current facility-administered medications for this visit.  Facility-Administered Medications Ordered in Other Visits:    0.9 %  sodium chloride infusion (Manually program via Guardrails IV Fluids), 250 mL, Intravenous, Continuous, [name], MD, Stopped at 07/26/23 1513   0.9 %  sodium chloride infusion, , Intravenous, Continuous, [name], MD, Stopped at 07/26/23 1322   sodium chloride flush (NS) 0.9 % injection 10 mL, 10 mL, Intracatheter, PRN, [name], MD   sodium chloride flush (NS) 0.9 % injection 10 mL, 10 mL, Intracatheter, PRN, [name], MD, 10 mL at 07/26/23 1513   Allergies: Allergies  Allergen Reactions   Motrin [Ibuprofen] Other (See Comments)    esrd    REVIEW OF SYSTEMS:   Review of Systems  Constitutional:  Negative for chills, fatigue and fever.  HENT:   Negative for lump/mass, mouth sores, nosebleeds, sore throat and trouble swallowing.  Eyes:  Negative for eye problems.  Respiratory:  Negative for cough and shortness of breath.   Cardiovascular:  Negative for chest pain, leg swelling and palpitations.  Gastrointestinal:  Negative for abdominal pain, constipation, diarrhea, nausea and vomiting.  Genitourinary:  Negative for bladder incontinence, difficulty urinating, dysuria, frequency, hematuria and nocturia.   Musculoskeletal:  Negative for arthralgias, back pain, flank pain, myalgias and neck pain.  Skin:  Negative for itching and rash.  Neurological:  Positive for dizziness and headaches. Negative for numbness.  Hematological:  Does not bruise/bleed easily.  Psychiatric/Behavioral:  Negative for depression, sleep disturbance and suicidal ideas. The patient is not nervous/anxious.   All other systems reviewed and are negative.    VITALS:   Blood pressure 138/80, pulse 91, temperature (!) 97.3 F (36.3 C), temperature source  Tympanic, resp. rate 20, weight 174 lb 3.2 oz (79 kg), SpO2 100%.  Wt Readings from Last 3 Encounters:  07/26/23 174 lb 3.2 oz (79 kg)  07/17/23 174 lb (78.9 kg)  07/05/23 172 lb 14.4 oz (78.4 kg)    Body mass index is 31.86 kg/m.  Performance status (ECOG): 1 - Symptomatic but completely ambulatory  PHYSICAL EXAM:   Physical Exam Vitals and nursing note reviewed. Exam conducted with a chaperone present.  Constitutional:      Appearance: Normal appearance.  Cardiovascular:     Rate and Rhythm: Normal rate and regular rhythm.     Pulses: Normal pulses.     Heart sounds: Normal heart sounds.  Pulmonary:     Effort: Pulmonary effort is normal.     Breath sounds: Normal breath sounds.  Abdominal:     Palpations: Abdomen is soft. There is no hepatomegaly, splenomegaly or mass.     Tenderness: There is no abdominal tenderness.  Musculoskeletal:     Right lower leg: No edema.     Left lower leg: No edema.  Lymphadenopathy:     Cervical: No cervical adenopathy.     Right cervical: No superficial, deep or posterior cervical adenopathy.    Left cervical: No superficial, deep or posterior cervical adenopathy.     Upper Body:     Right upper body: No supraclavicular or axillary adenopathy.     Left upper body: No supraclavicular or axillary adenopathy.  Neurological:     General: No focal deficit present.     Mental Status: She is alert and oriented to person, place, and time.  Psychiatric:        Mood and Affect: Mood normal.        Behavior: Behavior normal.     LABS:      Latest Ref Rng & Units 07/26/2023    8:07 AM 07/18/2023    3:14 PM 07/05/2023    9:55 AM  CBC  WBC 4.0 - 10.5 K/uL 5.7  8.4  6.2   Hemoglobin 12.0 - 15.0 g/dL 7.9  8.4  8.9   Hematocrit 36.0 - 46.0 % 25.2  27.0  27.6   Platelets 150 - 400 K/uL 253  202  247       Latest Ref Rng & Units 07/26/2023    8:07 AM 07/05/2023    9:55 AM 06/28/2023    9:24 AM  CMP  Glucose 70 - 99 mg/dL 578  469  98    BUN 8 - 23 mg/dL 27  28  26    Creatinine 0.44 - 1.00 mg/dL 6.29  5.28  4.13   Sodium 135 - 145 mmol/L 139  139  139   Potassium 3.5 - 5.1 mmol/L 4.4  4.1  3.5   Chloride 98 - 111 mmol/L 97  106  96   CO2 22 - 32 mmol/L 28  21  29    Calcium 8.9 - 10.3 mg/dL 9.0  91.4  9.3   Total Protein 6.5 - 8.1 g/dL 7.0  7.1  6.5   Total Bilirubin <1.2 mg/dL 0.4  0.5  0.4   Alkaline Phos 38 - 126 U/L 150  146  109   AST 15 - 41 U/L 19  18  16    ALT 0 - 44 U/L 17  22  16       No results found for: "CEA1", "CEA" / No results found for: "CEA1", "CEA" No results found for: "PSA1" No results found for: "NWG956" No results found for: "CAN125"  No results found for: "TOTALPROTELP", "ALBUMINELP", "A1GS", "A2GS", "BETS", "BETA2SER", "GAMS", "MSPIKE", "SPEI" Lab Results  Component Value Date   TIBC 195 (L) 02/15/2021   TIBC 190 (L) 12/10/2018   TIBC 218 (L) 06/08/2016   FERRITIN 1,742 (H) 02/15/2021   FERRITIN 1,308 (H) 12/10/2018   FERRITIN 126 06/08/2016   IRONPCTSAT 15 02/15/2021   IRONPCTSAT 43 (H) 12/10/2018   IRONPCTSAT 22 06/08/2016   No results found for: "LDH"   STUDIES:   IR DIALY SHUNT INTRO NEEDLE/INTRACATH INITIAL W/IMG LEFT Result Date: 07/17/2023 INDICATION: 62 year old with end-stage renal disease and left upper extremity fistula. Patient complains of left upper extremity swelling and left finger numbness. EXAM: LEFT UPPER EXTREMITY FISTULOGRAM MEDICATIONS: Versed 1 mg ANESTHESIA/SEDATION: Versed 1 mg FLUOROSCOPY: Radiation Exposure Index (as provided by the fluoroscopic device): 11 mGy Kerma CONTRAST:  40 mL Omnipaque 300 COMPLICATIONS: None immediate. PROCEDURE: Informed written consent was obtained from the patient after a thorough discussion of the procedural risks, benefits and alternatives. All questions were addressed. Left upper extremity fistula was accessed using Angiocath. Series of fistulogram images were obtained. Angiocath was removed with manual compression. Bandage  placed over the puncture site. FINDINGS: Distal left forearm radiocephalic fistula is patent. 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 Drainage in the upper arm appears to be from the cephalic vein and likely the basilic vein. No evidence for venous stenosis involving the outflow veins. Central veins are patent. Right subclavian Port-A-Cath with the tip near the superior cavoatrial junction. Arterial anastomosis is patent. IMPRESSION: Left radiocephalic fistula is patent without focal stenosis. ACCESS: This access remains amenable to future percutaneous interventions as clinically indicated. Electronically Signed   By: Richarda Overlie M.D.   On: 07/17/2023 18:21

## 2023-07-26 ENCOUNTER — Inpatient Hospital Stay: Payer: Medicare Other

## 2023-07-26 ENCOUNTER — Inpatient Hospital Stay (HOSPITAL_BASED_OUTPATIENT_CLINIC_OR_DEPARTMENT_OTHER): Payer: Medicare Other | Admitting: Hematology

## 2023-07-26 VITALS — BP 144/80 | HR 81 | Temp 97.5°F | Resp 18

## 2023-07-26 VITALS — BP 138/80 | HR 91 | Temp 97.3°F | Resp 20 | Wt 174.2 lb

## 2023-07-26 DIAGNOSIS — C50211 Malignant neoplasm of upper-inner quadrant of right female breast: Secondary | ICD-10-CM

## 2023-07-26 DIAGNOSIS — Z170 Estrogen receptor positive status [ER+]: Secondary | ICD-10-CM

## 2023-07-26 DIAGNOSIS — Z5189 Encounter for other specified aftercare: Secondary | ICD-10-CM | POA: Diagnosis not present

## 2023-07-26 DIAGNOSIS — C799 Secondary malignant neoplasm of unspecified site: Secondary | ICD-10-CM

## 2023-07-26 DIAGNOSIS — M8580 Other specified disorders of bone density and structure, unspecified site: Secondary | ICD-10-CM | POA: Diagnosis not present

## 2023-07-26 DIAGNOSIS — C50911 Malignant neoplasm of unspecified site of right female breast: Secondary | ICD-10-CM

## 2023-07-26 DIAGNOSIS — Z5111 Encounter for antineoplastic chemotherapy: Secondary | ICD-10-CM | POA: Diagnosis not present

## 2023-07-26 DIAGNOSIS — Z79811 Long term (current) use of aromatase inhibitors: Secondary | ICD-10-CM | POA: Diagnosis not present

## 2023-07-26 LAB — CBC WITH DIFFERENTIAL/PLATELET
Abs Immature Granulocytes: 0.01 10*3/uL (ref 0.00–0.07)
Basophils Absolute: 0.1 10*3/uL (ref 0.0–0.1)
Basophils Relative: 2 %
Eosinophils Absolute: 0.2 10*3/uL (ref 0.0–0.5)
Eosinophils Relative: 4 %
HCT: 25.2 % — ABNORMAL LOW (ref 36.0–46.0)
Hemoglobin: 7.9 g/dL — ABNORMAL LOW (ref 12.0–15.0)
Immature Granulocytes: 0 %
Lymphocytes Relative: 17 %
Lymphs Abs: 1 10*3/uL (ref 0.7–4.0)
MCH: 35 pg — ABNORMAL HIGH (ref 26.0–34.0)
MCHC: 31.3 g/dL (ref 30.0–36.0)
MCV: 111.5 fL — ABNORMAL HIGH (ref 80.0–100.0)
Monocytes Absolute: 0.7 10*3/uL (ref 0.1–1.0)
Monocytes Relative: 12 %
Neutro Abs: 3.7 10*3/uL (ref 1.7–7.7)
Neutrophils Relative %: 65 %
Platelets: 253 10*3/uL (ref 150–400)
RBC: 2.26 MIL/uL — ABNORMAL LOW (ref 3.87–5.11)
RDW: 19 % — ABNORMAL HIGH (ref 11.5–15.5)
WBC: 5.7 10*3/uL (ref 4.0–10.5)
nRBC: 0 % (ref 0.0–0.2)

## 2023-07-26 LAB — COMPREHENSIVE METABOLIC PANEL
ALT: 17 U/L (ref 0–44)
AST: 19 U/L (ref 15–41)
Albumin: 3.4 g/dL — ABNORMAL LOW (ref 3.5–5.0)
Alkaline Phosphatase: 150 U/L — ABNORMAL HIGH (ref 38–126)
Anion gap: 14 (ref 5–15)
BUN: 27 mg/dL — ABNORMAL HIGH (ref 8–23)
CO2: 28 mmol/L (ref 22–32)
Calcium: 9 mg/dL (ref 8.9–10.3)
Chloride: 97 mmol/L — ABNORMAL LOW (ref 98–111)
Creatinine, Ser: 8.41 mg/dL — ABNORMAL HIGH (ref 0.44–1.00)
GFR, Estimated: 5 mL/min — ABNORMAL LOW (ref >60)
Glucose, Bld: 107 mg/dL — ABNORMAL HIGH (ref 70–99)
Potassium: 4.4 mmol/L (ref 3.5–5.1)
Sodium: 139 mmol/L (ref 135–145)
Total Bilirubin: 0.4 mg/dL (ref <1.2)
Total Protein: 7 g/dL (ref 6.5–8.1)

## 2023-07-26 LAB — PREPARE RBC (CROSSMATCH)

## 2023-07-26 LAB — MAGNESIUM: Magnesium: 2.2 mg/dL (ref 1.7–2.4)

## 2023-07-26 MED ORDER — SODIUM CHLORIDE 0.9 % IV SOLN
60.0000 mg/m2 | Freq: Once | INTRAVENOUS | Status: AC
  Administered 2023-07-26: 112 mg via INTRAVENOUS
  Filled 2023-07-26: qty 11.2

## 2023-07-26 MED ORDER — PALONOSETRON HCL INJECTION 0.25 MG/5ML
0.2500 mg | Freq: Once | INTRAVENOUS | Status: AC
  Administered 2023-07-26: 0.25 mg via INTRAVENOUS
  Filled 2023-07-26: qty 5

## 2023-07-26 MED ORDER — SODIUM CHLORIDE 0.9% FLUSH
10.0000 mL | INTRAVENOUS | Status: DC | PRN
  Administered 2023-07-26: 10 mL

## 2023-07-26 MED ORDER — DIPHENHYDRAMINE HCL 25 MG PO CAPS
25.0000 mg | ORAL_CAPSULE | Freq: Once | ORAL | Status: AC
  Administered 2023-07-26: 25 mg via ORAL
  Filled 2023-07-26: qty 1

## 2023-07-26 MED ORDER — SODIUM CHLORIDE 0.9% FLUSH
10.0000 mL | Freq: Once | INTRAVENOUS | Status: AC
  Administered 2023-07-26: 10 mL via INTRAVENOUS

## 2023-07-26 MED ORDER — SODIUM CHLORIDE 0.9 % IV SOLN
150.0000 mg | Freq: Once | INTRAVENOUS | Status: AC
  Administered 2023-07-26: 150 mg via INTRAVENOUS
  Filled 2023-07-26: qty 150

## 2023-07-26 MED ORDER — HEPARIN SOD (PORK) LOCK FLUSH 100 UNIT/ML IV SOLN
500.0000 [IU] | Freq: Once | INTRAVENOUS | Status: AC | PRN
  Administered 2023-07-26: 500 [IU]

## 2023-07-26 MED ORDER — SODIUM CHLORIDE 0.9 % IV SOLN
160.0000 mg | Freq: Once | INTRAVENOUS | Status: AC
  Administered 2023-07-26: 160 mg via INTRAVENOUS
  Filled 2023-07-26: qty 16

## 2023-07-26 MED ORDER — ACETAMINOPHEN 325 MG PO TABS
650.0000 mg | ORAL_TABLET | Freq: Once | ORAL | Status: AC
  Administered 2023-07-26: 650 mg via ORAL
  Filled 2023-07-26: qty 2

## 2023-07-26 MED ORDER — SODIUM CHLORIDE 0.9% FLUSH
10.0000 mL | INTRAVENOUS | Status: DC | PRN
Start: 2023-07-26 — End: 2023-07-26

## 2023-07-26 MED ORDER — SODIUM CHLORIDE 0.9 % IV SOLN
200.0000 mg | Freq: Once | INTRAVENOUS | Status: AC
  Administered 2023-07-26: 200 mg via INTRAVENOUS
  Filled 2023-07-26: qty 8

## 2023-07-26 MED ORDER — SODIUM CHLORIDE 0.9 % IV SOLN: INTRAVENOUS | Status: DC

## 2023-07-26 MED ORDER — DEXAMETHASONE SODIUM PHOSPHATE 10 MG/ML IJ SOLN
10.0000 mg | Freq: Once | INTRAMUSCULAR | Status: AC
  Administered 2023-07-26: 10 mg via INTRAVENOUS
  Filled 2023-07-26: qty 1

## 2023-07-26 MED ORDER — SODIUM CHLORIDE 0.9% IV SOLUTION
250.0000 mL | INTRAVENOUS | Status: DC
  Administered 2023-07-26: 250 mL via INTRAVENOUS

## 2023-07-26 NOTE — Patient Instructions (Signed)
[facility] at [facility] Discharge Instructions   You were seen and examined today by Dr. Ellin Saba.  He reviewed the results of your lab work which are mostly normal/stable. Your hemoglobin is low today at 7.9. This is coming from the chemotherapy.   We will proceed with your treatment today.   Return as scheduled.    Thank you for choosing [facility] at [facility] to provide your oncology and hematology care.  To afford each patient quality time with our provider, please arrive at least 15 minutes before your scheduled appointment time.   If you have a lab appointment with the Cancer Center please come in thru the Main Entrance and check in at the main information desk.  You need to re-schedule your appointment should you arrive 10 or more minutes late.  We strive to give you quality time with our providers, and arriving late affects you and other patients whose appointments are after yours.  Also, if you no show three or more times for appointments you may be dismissed from the clinic at the providers discretion.     Again, thank you for choosing [facility].  Our hope is that these requests will decrease the amount of time that you wait before being seen by our physicians.       _____________________________________________________________  Should you have questions after your visit to [facility], please contact our office at [phone] and follow the prompts.  Our office hours are 8:00 a.m. and 4:30 p.m. Monday - Friday.  Please note that voicemails left after 4:00 p.m. may not be returned until the following business day.  We are closed weekends and major holidays.  You do have access to a nurse 24-7, just call the main number to the clinic [phone] and do not press any options, hold on the line and a nurse will answer the phone.    For prescription refill requests, have your pharmacy contact our office  and allow 72 hours.    Due to Covid, you will need to wear a mask upon entering the hospital. If you do not have a mask, a mask will be given to you at the Main Entrance upon arrival. For doctor visits, patients may have 1 support person age 31 or older with them. For treatment visits, patients can not have anyone with them due to social distancing guidelines and our immunocompromised population.

## 2023-07-26 NOTE — Progress Notes (Signed)
Patient presents today for chemotherapy infusion. Patient is in satisfactory condition with no new complaints voiced.  Vital signs are stable.  Labs reviewed by Dr. Ellin Saba during the office visit and all other labs are within treatment parameters.Patient's hemoglobin noted to be 7.9 today. Patient will receive 1 unit of blood today per Dr.K.  We will proceed with treatment per MD orders.   Treatment given today per MD orders. Tolerated infusion without adverse affects. Vital signs stable. No complaints at this time. Discharged from clinic ambulatory in stable condition. Alert and oriented x 3. F/U with [facility] as scheduled.

## 2023-07-26 NOTE — Patient Instructions (Signed)
[facility] [place] - [facility]  Discharge Instructions: Thank you for choosing [facility] to provide your oncology and hematology care.  If you have a lab appointment with the Cancer Center - please note that after April 8th, 2024, all labs will be drawn in the cancer center.  You do not have to check in or register with the main entrance as you have in the past but will complete your check-in in the cancer center.  Wear comfortable clothing and clothing appropriate for easy access to any Portacath or PICC line.   We strive to give you quality time with your provider. You may need to reschedule your appointment if you arrive late (15 or more minutes).  Arriving late affects you and other patients whose appointments are after yours.  Also, if you miss three or more appointments without notifying the office, you may be dismissed from the clinic at the provider's discretion.      For prescription refill requests, have your pharmacy contact our office and allow 72 hours for refills to be completed.    Today you received the following chemotherapy and/or immunotherapy agents Keytruda/Taxotere/Carboplatin   To help prevent nausea and vomiting after your treatment, we encourage you to take your nausea medication as directed.  BELOW ARE SYMPTOMS THAT SHOULD BE REPORTED IMMEDIATELY: *FEVER GREATER THAN 100.4 F (38 C) OR HIGHER *CHILLS OR SWEATING *NAUSEA AND VOMITING THAT IS NOT CONTROLLED WITH YOUR NAUSEA MEDICATION *UNUSUAL SHORTNESS OF BREATH *UNUSUAL BRUISING OR BLEEDING *URINARY PROBLEMS (pain or burning when urinating, or frequent urination) *BOWEL PROBLEMS (unusual diarrhea, constipation, pain near the anus) TENDERNESS IN MOUTH AND THROAT WITH OR WITHOUT PRESENCE OF ULCERS (sore throat, sores in mouth, or a toothache) UNUSUAL RASH, SWELLING OR PAIN  UNUSUAL VAGINAL DISCHARGE OR ITCHING   Items with * indicate a potential emergency and  should be followed up as soon as possible or go to the Emergency Department if any problems should occur.  Please show the CHEMOTHERAPY ALERT CARD or IMMUNOTHERAPY ALERT CARD at check-in to the Emergency Department and triage nurse.  Should you have questions after your visit or need to cancel or reschedule your appointment, please contact [facility] [place] - A DEPT OF Eligha Bridegroom [facility] [phone]  and follow the prompts.  Office hours are 8:00 a.m. to 4:30 p.m. Monday - Friday. Please note that voicemails left after 4:00 p.m. may not be returned until the following business day.  We are closed weekends and major holidays. You have access to a nurse at all times for urgent questions. Please call the main number to the clinic [phone] and follow the prompts.  For any non-urgent questions, you may also contact your provider using MyChart. We now offer e-Visits for anyone 62 and older to request care online for non-urgent symptoms. For details visit [email or web address].   Also download the MyChart app! Go to the app store, search "MyChart", open the app, select [place], and log in with your MyChart username and password.

## 2023-07-26 NOTE — Progress Notes (Signed)
Patient has been examined by Dr. Ellin Saba. Vital signs and labs have been reviewed by MD - ANC, Creatinine, LFTs, hemoglobin (7.9), and platelets are within treatment parameters per M.D. - pt may proceed with treatment. Transfuse 1 unit PRBC per MD. Primary RN and pharmacy notified.

## 2023-07-27 ENCOUNTER — Inpatient Hospital Stay: Payer: Medicare Other

## 2023-07-27 VITALS — BP 122/67 | HR 96 | Temp 97.8°F | Resp 18

## 2023-07-27 DIAGNOSIS — D631 Anemia in chronic kidney disease: Secondary | ICD-10-CM | POA: Diagnosis not present

## 2023-07-27 DIAGNOSIS — M8580 Other specified disorders of bone density and structure, unspecified site: Secondary | ICD-10-CM | POA: Diagnosis not present

## 2023-07-27 DIAGNOSIS — Z5189 Encounter for other specified aftercare: Secondary | ICD-10-CM | POA: Diagnosis not present

## 2023-07-27 DIAGNOSIS — Z5111 Encounter for antineoplastic chemotherapy: Secondary | ICD-10-CM | POA: Diagnosis not present

## 2023-07-27 DIAGNOSIS — N2581 Secondary hyperparathyroidism of renal origin: Secondary | ICD-10-CM | POA: Diagnosis not present

## 2023-07-27 DIAGNOSIS — C50911 Malignant neoplasm of unspecified site of right female breast: Secondary | ICD-10-CM

## 2023-07-27 DIAGNOSIS — Z170 Estrogen receptor positive status [ER+]: Secondary | ICD-10-CM | POA: Diagnosis not present

## 2023-07-27 DIAGNOSIS — E119 Type 2 diabetes mellitus without complications: Secondary | ICD-10-CM | POA: Diagnosis not present

## 2023-07-27 DIAGNOSIS — Z79811 Long term (current) use of aromatase inhibitors: Secondary | ICD-10-CM | POA: Diagnosis not present

## 2023-07-27 DIAGNOSIS — N186 End stage renal disease: Secondary | ICD-10-CM | POA: Diagnosis not present

## 2023-07-27 DIAGNOSIS — Z992 Dependence on renal dialysis: Secondary | ICD-10-CM | POA: Diagnosis not present

## 2023-07-27 DIAGNOSIS — C50211 Malignant neoplasm of upper-inner quadrant of right female breast: Secondary | ICD-10-CM | POA: Diagnosis not present

## 2023-07-27 DIAGNOSIS — E1129 Type 2 diabetes mellitus with other diabetic kidney complication: Secondary | ICD-10-CM | POA: Diagnosis not present

## 2023-07-27 LAB — BPAM RBC
Blood Product Expiration Date: 202501042359
ISSUE DATE / TIME: 202412121328
Unit Type and Rh: 1700

## 2023-07-27 LAB — TYPE AND SCREEN
ABO/RH(D): B POS
Antibody Screen: NEGATIVE
Unit division: 0

## 2023-07-27 MED ORDER — PEGFILGRASTIM-FPGK 6 MG/0.6ML ~~LOC~~ SOSY
6.0000 mg | PREFILLED_SYRINGE | Freq: Once | SUBCUTANEOUS | Status: AC
  Administered 2023-07-27: 6 mg via SUBCUTANEOUS
  Filled 2023-07-27: qty 0.6

## 2023-07-27 NOTE — Progress Notes (Signed)
Megan [name] presents today for injection per the provider's orders.  STIMUFEND administration without incident; injection site WNL; see MAR for injection details.  Patient tolerated procedure well and without incident. Vital signs stable. No complaints at this time. Discharged from clinic ambulatory in stable condition. Alert and oriented x 3. F/U with [facility] as scheduled.

## 2023-07-27 NOTE — Patient Instructions (Signed)
[facility] [place] [facility]  Discharge Instructions: Thank you for choosing [facility] to provide your oncology and hematology care.  If you have a lab appointment with the Cancer Center - please note that after April 8th, 2024, all labs will be drawn in the cancer center.  You do not have to check in or register with the main entrance as you have in the past but will complete your check-in in the cancer center.  Wear comfortable clothing and clothing appropriate for easy access to any Portacath or PICC line.   We strive to give you quality time with your provider. You may need to reschedule your appointment if you arrive late (15 or more minutes).  Arriving late affects you and other patients whose appointments are after yours.  Also, if you miss three or more appointments without notifying the office, you may be dismissed from the clinic at the provider's discretion.      For prescription refill requests, have your pharmacy contact our office and allow 72 hours for refills to be completed.    Today you received the following chemotherapy and/or immunotherapy agents STIMUFEND.      To help prevent nausea and vomiting after your treatment, we encourage you to take your nausea medication as directed.  BELOW ARE SYMPTOMS THAT SHOULD BE REPORTED IMMEDIATELY: *FEVER GREATER THAN 100.4 F (38 C) OR HIGHER *CHILLS OR SWEATING *NAUSEA AND VOMITING THAT IS NOT CONTROLLED WITH YOUR NAUSEA MEDICATION *UNUSUAL SHORTNESS OF BREATH *UNUSUAL BRUISING OR BLEEDING *URINARY PROBLEMS (pain or burning when urinating, or frequent urination) *BOWEL PROBLEMS (unusual diarrhea, constipation, pain near the anus) TENDERNESS IN MOUTH AND THROAT WITH OR WITHOUT PRESENCE OF ULCERS (sore throat, sores in mouth, or a toothache) UNUSUAL RASH, SWELLING OR PAIN  UNUSUAL VAGINAL DISCHARGE OR ITCHING   Items with * indicate a potential emergency and should be followed  up as soon as possible or go to the Emergency Department if any problems should occur.  Please show the CHEMOTHERAPY ALERT CARD or IMMUNOTHERAPY ALERT CARD at check-in to the Emergency Department and triage nurse.  Should you have questions after your visit or need to cancel or reschedule your appointment, please contact [facility] [place] - A DEPT OF [facility]igha Bridegroom [facility] [phone]  and follow the prompts.  Office hours are 8:00 a.m. to 4:30 p.m. Monday - Friday. Please note that voicemails left after 4:00 p.m. may not be returned until the following business day.  We are closed weekends and major holidays. You have access to a nurse at all times for urgent questions. Please call the main number to the clinic [phone] and follow the prompts.  For any non-urgent questions, you may also contact your provider using MyChart. We now offer e-Visits for anyone 68 and older to request care online for non-urgent symptoms. For details visit [email or web address].   Also download the MyChart app! Go to the app store, search "MyChart", open the app, select [place], and log in with your MyChart username and password.

## 2023-07-30 DIAGNOSIS — D631 Anemia in chronic kidney disease: Secondary | ICD-10-CM | POA: Diagnosis not present

## 2023-07-30 DIAGNOSIS — E119 Type 2 diabetes mellitus without complications: Secondary | ICD-10-CM | POA: Diagnosis not present

## 2023-07-30 DIAGNOSIS — N186 End stage renal disease: Secondary | ICD-10-CM | POA: Diagnosis not present

## 2023-07-30 DIAGNOSIS — E1129 Type 2 diabetes mellitus with other diabetic kidney complication: Secondary | ICD-10-CM | POA: Diagnosis not present

## 2023-07-30 DIAGNOSIS — Z992 Dependence on renal dialysis: Secondary | ICD-10-CM | POA: Diagnosis not present

## 2023-07-30 DIAGNOSIS — N2581 Secondary hyperparathyroidism of renal origin: Secondary | ICD-10-CM | POA: Diagnosis not present

## 2023-08-01 DIAGNOSIS — N2581 Secondary hyperparathyroidism of renal origin: Secondary | ICD-10-CM | POA: Diagnosis not present

## 2023-08-01 DIAGNOSIS — E119 Type 2 diabetes mellitus without complications: Secondary | ICD-10-CM | POA: Diagnosis not present

## 2023-08-01 DIAGNOSIS — E1129 Type 2 diabetes mellitus with other diabetic kidney complication: Secondary | ICD-10-CM | POA: Diagnosis not present

## 2023-08-01 DIAGNOSIS — N186 End stage renal disease: Secondary | ICD-10-CM | POA: Diagnosis not present

## 2023-08-01 DIAGNOSIS — D631 Anemia in chronic kidney disease: Secondary | ICD-10-CM | POA: Diagnosis not present

## 2023-08-01 DIAGNOSIS — Z992 Dependence on renal dialysis: Secondary | ICD-10-CM | POA: Diagnosis not present

## 2023-08-03 DIAGNOSIS — E1129 Type 2 diabetes mellitus with other diabetic kidney complication: Secondary | ICD-10-CM | POA: Diagnosis not present

## 2023-08-03 DIAGNOSIS — N2581 Secondary hyperparathyroidism of renal origin: Secondary | ICD-10-CM | POA: Diagnosis not present

## 2023-08-03 DIAGNOSIS — E119 Type 2 diabetes mellitus without complications: Secondary | ICD-10-CM | POA: Diagnosis not present

## 2023-08-03 DIAGNOSIS — N186 End stage renal disease: Secondary | ICD-10-CM | POA: Diagnosis not present

## 2023-08-03 DIAGNOSIS — Z992 Dependence on renal dialysis: Secondary | ICD-10-CM | POA: Diagnosis not present

## 2023-08-03 DIAGNOSIS — D631 Anemia in chronic kidney disease: Secondary | ICD-10-CM | POA: Diagnosis not present

## 2023-08-05 DIAGNOSIS — D631 Anemia in chronic kidney disease: Secondary | ICD-10-CM | POA: Diagnosis not present

## 2023-08-05 DIAGNOSIS — N2581 Secondary hyperparathyroidism of renal origin: Secondary | ICD-10-CM | POA: Diagnosis not present

## 2023-08-05 DIAGNOSIS — Z992 Dependence on renal dialysis: Secondary | ICD-10-CM | POA: Diagnosis not present

## 2023-08-05 DIAGNOSIS — E119 Type 2 diabetes mellitus without complications: Secondary | ICD-10-CM | POA: Diagnosis not present

## 2023-08-05 DIAGNOSIS — E1129 Type 2 diabetes mellitus with other diabetic kidney complication: Secondary | ICD-10-CM | POA: Diagnosis not present

## 2023-08-05 DIAGNOSIS — N186 End stage renal disease: Secondary | ICD-10-CM | POA: Diagnosis not present

## 2023-08-07 ENCOUNTER — Other Ambulatory Visit: Payer: Self-pay | Admitting: Family Medicine

## 2023-08-07 DIAGNOSIS — D631 Anemia in chronic kidney disease: Secondary | ICD-10-CM | POA: Diagnosis not present

## 2023-08-07 DIAGNOSIS — E119 Type 2 diabetes mellitus without complications: Secondary | ICD-10-CM | POA: Diagnosis not present

## 2023-08-07 DIAGNOSIS — N2581 Secondary hyperparathyroidism of renal origin: Secondary | ICD-10-CM | POA: Diagnosis not present

## 2023-08-07 DIAGNOSIS — E1129 Type 2 diabetes mellitus with other diabetic kidney complication: Secondary | ICD-10-CM | POA: Diagnosis not present

## 2023-08-07 DIAGNOSIS — N186 End stage renal disease: Secondary | ICD-10-CM | POA: Diagnosis not present

## 2023-08-07 DIAGNOSIS — Z992 Dependence on renal dialysis: Secondary | ICD-10-CM | POA: Diagnosis not present

## 2023-08-10 DIAGNOSIS — E119 Type 2 diabetes mellitus without complications: Secondary | ICD-10-CM | POA: Diagnosis not present

## 2023-08-10 DIAGNOSIS — N186 End stage renal disease: Secondary | ICD-10-CM | POA: Diagnosis not present

## 2023-08-10 DIAGNOSIS — Z992 Dependence on renal dialysis: Secondary | ICD-10-CM | POA: Diagnosis not present

## 2023-08-10 DIAGNOSIS — E1129 Type 2 diabetes mellitus with other diabetic kidney complication: Secondary | ICD-10-CM | POA: Diagnosis not present

## 2023-08-10 DIAGNOSIS — D631 Anemia in chronic kidney disease: Secondary | ICD-10-CM | POA: Diagnosis not present

## 2023-08-10 DIAGNOSIS — N2581 Secondary hyperparathyroidism of renal origin: Secondary | ICD-10-CM | POA: Diagnosis not present

## 2023-08-12 DIAGNOSIS — N186 End stage renal disease: Secondary | ICD-10-CM | POA: Diagnosis not present

## 2023-08-12 DIAGNOSIS — E119 Type 2 diabetes mellitus without complications: Secondary | ICD-10-CM | POA: Diagnosis not present

## 2023-08-12 DIAGNOSIS — E1129 Type 2 diabetes mellitus with other diabetic kidney complication: Secondary | ICD-10-CM | POA: Diagnosis not present

## 2023-08-12 DIAGNOSIS — Z992 Dependence on renal dialysis: Secondary | ICD-10-CM | POA: Diagnosis not present

## 2023-08-12 DIAGNOSIS — N2581 Secondary hyperparathyroidism of renal origin: Secondary | ICD-10-CM | POA: Diagnosis not present

## 2023-08-12 DIAGNOSIS — D631 Anemia in chronic kidney disease: Secondary | ICD-10-CM | POA: Diagnosis not present

## 2023-08-13 ENCOUNTER — Other Ambulatory Visit: Payer: Self-pay | Admitting: Family Medicine

## 2023-08-14 DIAGNOSIS — E1129 Type 2 diabetes mellitus with other diabetic kidney complication: Secondary | ICD-10-CM | POA: Diagnosis not present

## 2023-08-14 DIAGNOSIS — D631 Anemia in chronic kidney disease: Secondary | ICD-10-CM | POA: Diagnosis not present

## 2023-08-14 DIAGNOSIS — Z992 Dependence on renal dialysis: Secondary | ICD-10-CM | POA: Diagnosis not present

## 2023-08-14 DIAGNOSIS — E119 Type 2 diabetes mellitus without complications: Secondary | ICD-10-CM | POA: Diagnosis not present

## 2023-08-14 DIAGNOSIS — N2581 Secondary hyperparathyroidism of renal origin: Secondary | ICD-10-CM | POA: Diagnosis not present

## 2023-08-14 DIAGNOSIS — N186 End stage renal disease: Secondary | ICD-10-CM | POA: Diagnosis not present

## 2023-08-15 DIAGNOSIS — Z992 Dependence on renal dialysis: Secondary | ICD-10-CM | POA: Diagnosis not present

## 2023-08-15 DIAGNOSIS — N186 End stage renal disease: Secondary | ICD-10-CM | POA: Diagnosis not present

## 2023-08-15 DIAGNOSIS — N040 Nephrotic syndrome with minor glomerular abnormality: Secondary | ICD-10-CM | POA: Diagnosis not present

## 2023-08-15 NOTE — Progress Notes (Signed)
 Megan [name] [address] [place] [address]    Clinic Day:  08/16/23   Referring physician: Rogers Hai, MD  Patient Care Team: Megan Butler DASEN, MD as PCP - General (Family Medicine) Megan Dorn FALCON, MD as PCP - Cardiology (Cardiology) Megan Lamar HERO, MD (Gastroenterology) Megan Jayson MATSU, MD as Consulting Physician (Cardiology) Megan Hai, MD as Medical Oncologist (Medical Oncology) Megan Pac, MD as Consulting Physician (Nephrology)   ASSESSMENT & PLAN:   Assessment: 1.  Stage Ib (T1CN0) right breast cancer, ER positive, PR and HER-2 negative: -Right lumpectomy and sentinel lymph node biopsy on 09/18/2018 shows 1.5 cm IDC, grade 3, associated with high-grade DCIS, negative margins, 0/3 lymph nodes positive, ER 50%, PR negative and HER-2 negative, Ki-67 60%. -Oncotype DX recurrence score 47.  Distant recurrence at 9 years with tamoxifen alone is 36%.  Absolute chemotherapy benefit is more than 15%. -Adjuvant chemotherapy with 4 cycles of AC from 10/30/2018 through 01/01/2019. -She was started on anastrozole  in June 2020. -Mammogram on 09/02/2019 showed calcifications in the posterior aspect of upper outer quadrant of the right breast. -Right partial mastectomy on 02/18/2020 shows high-grade DCIS, 1.5 cm, no invasive carcinoma, resection margins negative.  0/11 lymph nodes.  ER 30% positive, PR negative. -Left simple mastectomy on 06/15/2020 was benign.  Right breast biopsy shows microscopic focus of invasive ductal carcinoma in the background of extensive high-grade DCIS. -Right breast lumpectomy on 07/13/2020 shows multifocal invasive ductal carcinoma, grade 3, largest measuring 1.2 cm.  Extensive high-grade DCIS with necrosis.  Margins are free.  PT1CNX.  There are multiple foci of invasive carcinoma arising from extensive DCIS.  Several small foci of invasive tumor arising from DCIS indicating new primaries.  ER/PR/HER-2 is negative.   Ki-67 is 20%. -PET scan on 09/14/2020 shows areas of nodularity with discrete nodule in the fat of the inferior chest wall within the subcutaneous tissues. -Ultrasound of the right chest wall shows masslike abnormality in the upper inner aspect of the right anterior chest at 1 o'clock position measuring 4.1 cm in greatest dimension.  1.7 cm hypoechoic mass in the central anterior right chest concerning for malignancy.  Possible borderline enlarged residual right axillary lymph node. -Right breast biopsy on 10/12/2020 shows 1.3 cm invasive ductal carcinoma, focally 0.1 cm from superior margin.  DCIS is less than 0.1 cm from superior margin.  PT1CPNX. - Right mastectomy followed by radiation therapy completed in May 2022. - PET scan (11/30/2022): Recurrent/metastatic disease within the right medial pectoralis musculature. - Biopsy (12/19/2022): Metastatic breast adenocarcinoma.  ER positive, 40%, weak staining.  PR negative.  Ki-67 30%.  HER2 IHC 0. - Right chest wall mass resection on 01/18/2023.  Pathology shows 1.7 cm IDC, grade 3.  DCIS with high-grade with necrosis.  Carcinoma involves inked inferior margin with focally invading into atrophic skeletal muscle. - Her case was discussed at multidisciplinary clinic at [facility] on 04/18/2023. - Surgical oncology felt that resection margins are adequate and recommended radiation therapy.  She was also evaluated by medical oncology (Dr. [name]) who has recommended systemic therapy with 6 cycles of docetaxel , carboplatin  and pembrolizumab . - XRT from 05/07/2023 through 05/25/2023. - Cycle 1 carboplatin , docetaxel  and pembrolizumab  on 06/14/2023  2.  Osteopenia: -Bone density on 01/20/2019 shows T score -1.9. - DEXA scan (03/24/2021) T score -2.4  3.  ESRD on HD: - She is on HD on Monday/Wednesday/Friday under the direction of Dr. Rayburn.    Plan: 1.  Recurrent  right breast cancer, ER 40%, weak staining, PR negative, HER2 negative: - She tolerated cycle  3 very well.  Did not have any GI side effects.  No immunotherapy related side effects reported. - Did not have any passing out episodes at dialysis. - Labs today: LFTs normal with albumin  3.2.  CBC grossly normal with anemia.  Last blood transfusion was on 07/26/2023. - She reported left upper extremity swelling as well as left hand fingers tingling with occasional worsening for the last 2 months.  She has an appointment to see vascular surgery on 08/26/2023. - She may proceed with cycle 4 today.  RTC 3 weeks for follow-up.   2.  Hypertension: - Continue Norvasc  10 mg daily and Bystolic  10 mg daily.  Blood pressure today is 150/75..   3.  Neuropathy: - Neuropathy in the feet has been stable with no worsening since cycle 2.   4.  Right chest wall pain: - This is well-controlled.  She is requiring oxycodone  5 mg once every 2 weeks.     No orders of the defined types were placed in this encounter.     Megan [name],acting as a neurosurgeon for Megan Stands, MD.,have documented all relevant documentation on the behalf of Megan Stands, MD,as directed by  Megan Stands, MD while in the presence of Megan Stands, MD.  I, Megan Stands MD, have reviewed the above documentation for accuracy and completeness, and I agree with the above.      Megan Stands, MD   1/2/20253:07 PM  CHIEF COMPLAINT:   Diagnosis: recurrent right breast cancer    Cancer Staging  Malignant neoplasm of upper-inner quadrant of right female breast [facility]) Staging form: Breast, AJCC 8th Edition - Clinical stage from 09/26/2018: Stage IB (cT1c, cN0, cM0, G3, ER+, PR-, HER2-) - Signed by [name] Alean, MD on 09/26/2018    Prior Therapy: 1. Right lumpectomy and sentinel lymph node biopsy on 09/18/2018  2. Adjuvant chemotherapy with 4 cycles of AC from 10/30/2018 through 01/01/2019  3. Right partial mastectomy on 02/18/2020  4. Right breast lumpectomy on 07/13/2020  5. Right  mastectomy followed by radiation therapy completed in May 2022 6.  Anastrozole  started in June 2020  Current Therapy: Carboplatin , docetaxel , pembrolizumab    HISTORY OF PRESENT ILLNESS:   Oncology History  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)  09/18/2018 Initial Diagnosis   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)   09/26/2018 Cancer Staging   Staging form: Breast, AJCC 8th Edition - Clinical stage from 09/26/2018: Stage IB (cT1c, cN0, cM0, G3, ER+, PR-, HER2-) - Signed by [name] Alean, MD on 09/26/2018   10/30/2018 - 01/01/2019 Chemotherapy   Patient is on Treatment Plan : BREAST Adjuvant AC q21d     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 female presenting to clinic today for follow up of recurrent right breast cancer. She was last seen by me on 07/26/23.  Today, she states that she is doing well overall. Her appetite level is at 70%. Her energy level is at 80%. She is accompanied by her husband.   She tolerated her last treatment well and denies any issues during  dialysis. Her neuropathy is stable. She denies any skin rashes or diarrhea.   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 Associated symptoms include constant waxing and waning tingling in the fingertips on the left hand, as well as swelling of the left arm. Swelling has been present for 3 years and the tingling has occurred over the past 2 months. She denies any pain to the area. She has had multiple tests and US  done without finding cause of symptoms.   She reports right chest wall pain is manageable and  intermittent. She typically takes oxycodone  5 mg once every 2 weeks and Tylenol  prn.   PAST MEDICAL HISTORY:   Past Medical History: Past Medical History:  Diagnosis Date   (HFpEF) heart failure with preserved ejection fraction (HCC)    a. 01/2019 Echo: EF 55-60%, mild conc LVH. DD.  Torn MV chordae.   Anemia    Atypical chest pain    a. 08/2018 MV: EF 59%, no ischemia; b. 02/2019 Cath: nonobs dzs.   Blood transfusion without reported diagnosis    Breast cancer (HCC) 10/12/2020   Cataract    ESRD (end stage renal disease) on dialysis [name])    a. HD T, T, S   Essential hypertension, benign    GERD (gastroesophageal reflux disease)    Headache    Hemorrhoids    Mixed hyperlipidemia    Morbid obesity (HCC)    Non-obstructive CAD (coronary artery disease)    a. 02/2019 CathL LM nl, LAD 61m, LCX nl, RCA 25p, EF 55-65%.   PONV (postoperative nausea and vomiting)    Renal insufficiency    S/P colonoscopy 08/2009   Dr. Rollin: sessile polyp (benign lymphoid), large hemorrhoids, repeat 5-10 years   Temporal arteritis (HCC)    Type 2 diabetes mellitus (HCC)    Wears glasses     Surgical History: Past Surgical History:  Procedure Laterality Date   ABDOMINAL HYSTERECTOMY     APPENDECTOMY     ARTERY BIOPSY N/A 05/09/2018   Procedure: RIGHT TEMPORAL ARTERY BIOPSY;  Surgeon: Kimble Agent, MD;  Location: [facility];  Service: General;  Laterality: N/A;   BREAST BIOPSY Right 06/15/2020   Procedure: RIGHT BREAST BIOPSY;  Surgeon: Mavis Anes, MD;  Location: [facility];  Service: General;  Laterality: Right;   CATARACT EXTRACTION W/PHACO Left 02/09/2017   Procedure: CATARACT EXTRACTION PHACO AND INTRAOCULAR LENS PLACEMENT LEFT EYE;  Surgeon: Perley Hamilton, MD;  Location: [facility];  Service: Ophthalmology;  Laterality: Left;  CDE: 4.89   CATARACT EXTRACTION W/PHACO Right 06/04/2017   Procedure: CATARACT EXTRACTION PHACO AND INTRAOCULAR LENS PLACEMENT (IOC);  Surgeon: Perley Hamilton, MD;  Location: [facility];   Service: Ophthalmology;  Laterality: Right;  CDE: 4.12   CHOLECYSTECTOMY  09/29/2011   Procedure: LAPAROSCOPIC CHOLECYSTECTOMY;  Surgeon: Anes DELENA Mavis, MD;  Location: [facility];  Service: General;  Laterality: N/A;   COLONOSCOPY  08/2009   Dr. Rollin: sessile polyp (benign lymphoid), large hemorrhoids, repeat 5-10 years   COLONOSCOPY N/A 06/12/2016   prominent hemorrhoids   COLONOSCOPY WITH PROPOFOL  N/A 06/16/2021   Procedure: COLONOSCOPY WITH PROPOFOL ;  Surgeon: Megan Lamar HERO, MD;  [facility];  Service: Endoscopy;  Laterality: N/A;  9:30am (dialysis pt)   ESOPHAGOGASTRODUODENOSCOPY  09/05/2011   MFM:Dfjoo hiatal hernia; remainder of exam normal. No explanation for patient's abdominal pain with today's examination   ESOPHAGOGASTRODUODENOSCOPY N/A 12/17/2013   Dr. Shaaron: gastric  erythema, erosion, mild chronic inflammation on path    ESOPHAGOGASTRODUODENOSCOPY (EGD) WITH PROPOFOL  N/A 06/16/2021   Procedure: ESOPHAGOGASTRODUODENOSCOPY (EGD) WITH PROPOFOL ;  Surgeon: Megan Lamar HERO, MD;  [facility];  Service: Endoscopy;  Laterality: N/A;   EXCISION OF BREAST BIOPSY Right 10/12/2020   Procedure: EXCISION OF RIGHT BREAST BIOPSY;  Surgeon: Mavis Anes, MD;  Location: [facility];  Service: General;  Laterality: Right;   IR DIALY SHUNT INTRO NEEDLE/INTRACATH INITIAL W/IMG LEFT Left 07/17/2023   LAPAROSCOPIC APPENDECTOMY  09/29/2011   Procedure: APPENDECTOMY LAPAROSCOPIC;  Surgeon: Anes DELENA Mavis, MD;  Location: [facility];  Service: General;;  incidental appendectomy   LEFT HEART CATH AND CORONARY ANGIOGRAPHY N/A 02/28/2019   Procedure: LEFT HEART CATH AND CORONARY ANGIOGRAPHY;  Surgeon: Dann Candyce RAMAN, MD;  [facility];  Service: Cardiovascular;  Laterality: N/A;   MASS EXCISION Right 01/18/2023   Procedure: EXCISION MASS, RIGHT CHEST S/P MASTECTOMY;  Surgeon: Mavis Anes, MD;  Location: [facility];  Service: General;  Laterality: Right;   MASTECTOMY MODIFIED  RADICAL Right 02/18/2020   Procedure: MASTECTOMY MODIFIED RADICAL;  Surgeon: Mavis Anes, MD;  Location: [facility];  Service: General;  Laterality: Right;   MASTECTOMY, PARTIAL Right 07/13/2020   Procedure: RIGHT PARTIAL MASTECTOMY;  Surgeon: Mavis Anes, MD;  Location: [facility];  Service: General;  Laterality: Right;   PARTIAL MASTECTOMY WITH NEEDLE LOCALIZATION AND AXILLARY SENTINEL LYMPH NODE BX Right 09/18/2018   Procedure: RIGHT PARTIAL MASTECTOMY AFTER NEEDLE LOCALIZATION, SENTINEL LYMPH NODE BIOPSY RIGHT AXILLA;  Surgeon: Mavis Anes, MD;  Location: [facility];  Service: General;  Laterality: Right;   POLYPECTOMY  06/16/2021   Procedure: POLYPECTOMY;  Surgeon: Megan Lamar HERO, MD;  [facility];  Service: Endoscopy;;   PORTACATH PLACEMENT Right 06/07/2023   Procedure: INSERTION PORT-A-CATH, RIGHT  (DIALYSIS ACCESS ON LEFT);  Surgeon: Mavis Anes, MD;  Location: [facility];  Service: General;  Laterality: Right;   SIMPLE MASTECTOMY WITH AXILLARY SENTINEL NODE BIOPSY Left 06/15/2020   Procedure: LEFT SIMPLE MASTECTOMY;  Surgeon: Mavis Anes, MD;  Location: [facility];  Service: General;  Laterality: Left;    Social History: Social History   Socioeconomic History   Marital status: Married    Spouse name: Not on file   Number of children: Not on file   Years of education: Not on file   Highest education level: 12th grade  Occupational History   Occupation: Systems Developer: [employer]  Tobacco Use   Smoking status: Never    Passive exposure: Never   Smokeless tobacco: Never  Vaping Use   Vaping status: Never Used  Substance and Sexual Activity   Alcohol use: No   Drug use: No   Sexual activity: Yes    Birth control/protection: Surgical  Other Topics Concern   Not on file  Social History Narrative   Works at [employer] in [place].    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   Also has to get items from high shelves-causes achy pain in shoulder area       Married.   Children are grown, out of house.   Social Drivers of Corporate Investment Banker Strain: Low Risk  (06/08/2023)   Overall Financial Resource Strain (CARDIA)    Difficulty of Paying Living Expenses: Not hard at all  Food Insecurity: No Food Insecurity (06/08/2023)   Hunger Vital Sign    Worried About Running Out  of Food in the Last Year: Never true    Ran Out of Food in the Last Year: Never true  Transportation Needs: No Transportation Needs (06/08/2023)   PRAPARE - Administrator, Civil Service (Medical): No    Lack of Transportation (Non-Medical): No  Physical Activity: Inactive (06/08/2023)   Exercise Vital Sign    Days of Exercise per Week: 0 days    Minutes of Exercise per Session: 0 min  Stress: No Stress Concern Present (06/08/2023)   [employer] of Occupational Health - Occupational Stress Questionnaire    Feeling of Stress : Not at all  Social Connections: Socially Integrated (06/08/2023)   Social Connection and Isolation Panel [NHANES]    Frequency of Communication with Friends and Family: More than three times a week    Frequency of Social Gatherings with Friends and Family: Once a week    Attends Religious Services: More than 4 times per year    Active Member of [employer] or Organizations: No    Attends Engineer, Structural: More than 4 times per year    Marital Status: Married  Catering Manager Violence: Not At Risk (09/14/2022)   Humiliation, Afraid, Rape, and Kick questionnaire    Fear of Current or Ex-Partner: No    Emotionally Abused: No    Physically Abused: No    Sexually Abused: No    Family History: Family History  Problem Relation Age of Onset   Hypertension Mother    Coronary artery disease Mother    Diabetes Mother    Hypertension Sister    Coronary artery disease Sister    Hypertension Brother    Heart attack Father    Hypertension Son    Heart attack Maternal Aunt    Hypertension Maternal Aunt     Diabetes Maternal Aunt    Heart attack Maternal Uncle    Hypertension Maternal Uncle    Diabetes Maternal Uncle    Heart attack Paternal Aunt    Hypertension Paternal Aunt    Diabetes Paternal Aunt    Heart attack Paternal Uncle    Hypertension Paternal Uncle    Diabetes Paternal Uncle    Heart attack Maternal Grandmother    Heart attack Maternal Grandfather    Heart attack Paternal Grandmother    Heart attack Paternal Grandfather    Colon cancer Neg Hx     Current Medications:  Current Outpatient Medications:    acetaminophen  (TYLENOL ) 500 MG tablet, Take 500-1,000 mg by mouth every 6 (six) hours as needed (pain.)., Disp: , Rfl:    amLODipine  (NORVASC ) 10 MG tablet, TAKE 1 TABLET BY MOUTH AT BEDTIME, Disp: 90 tablet, Rfl: 3   anastrozole  (ARIMIDEX ) 1 MG tablet, TAKE 1 TABLET BY MOUTH DAILY, Disp: 90 tablet, Rfl: 3   aspirin  EC 81 MG tablet, Take 81 mg by mouth in the morning., Disp: , Rfl:    atorvastatin  (LIPITOR) 20 MG tablet, TAKE 1 TABLET(20 MG) BY MOUTH DAILY, Disp: 90 tablet, Rfl: 0   B Complex-C-Zn-Folic Acid  (DIALYVITE  800-ZINC  15) 0.8 MG TABS, Take 1 tablet by mouth in the morning., Disp: , Rfl:    cinacalcet  (SENSIPAR ) 30 MG tablet, Take 2 tablets (60 mg total) by mouth daily. (Patient taking differently: Take 90 mg by mouth daily with breakfast. With a meal), Disp: 90 tablet, Rfl: 0   Darbepoetin Alfa  (ARANESP , ALBUMIN  FREE, IJ), Darbepoetin Alfa  (Aranesp ), Disp: , Rfl:    fluticasone  (FLONASE ) 50 MCG/ACT nasal spray, Place 2 sprays into both  nostrils daily., Disp: 16 g, Rfl: 6   gabapentin  (NEURONTIN ) 300 MG capsule, TAKE 1 CAPSULE(300 MG) BY MOUTH AT BEDTIME, Disp: 90 capsule, Rfl: 3   iron  sucrose (VENOFER ) 20 MG/ML injection, Inject 50 mg into the vein once a week., Disp: , Rfl:    lanthanum  (FOSRENOL ) 1000 MG chewable tablet, Chew 2,000-3,000 mg by mouth See admin instructions. Take 3 tablets (3000 mg) by mouth with meals and take 2 tablets (2000 mg) with snacks, Disp:  , Rfl:    lidocaine -prilocaine  (EMLA ) cream, Apply 1 application  topically every Monday, Wednesday, and Friday with hemodialysis., Disp: , Rfl:    meclizine  (ANTIVERT ) 25 MG tablet, Take 1 tablet (25 mg total) by mouth 3 (three) times daily as needed for dizziness., Disp: 30 tablet, Rfl: 1   nebivolol  (BYSTOLIC ) 10 MG tablet, TAKE 1 TABLET BY MOUTH IN THE MORNING AND AT BEDTIME, Disp: 180 tablet, Rfl: 0   nitroGLYCERIN  (NITROSTAT ) 0.4 MG SL tablet, Place 1 tablet (0.4 mg total) under the tongue every 5 (five) minutes x 3 doses as needed for chest pain (if no relief after 3rd dose, proceed to ED or call 911)., Disp: 25 tablet, Rfl: 3   ondansetron  (ZOFRAN ) 4 MG tablet, Take 2 tablets (8 mg total) by mouth every 8 (eight) hours as needed for nausea or vomiting., Disp: 90 tablet, Rfl: 0   oxyCODONE  (ROXICODONE ) 5 MG immediate release tablet, Take 1 tablet (5 mg total) by mouth every 6 (six) hours as needed., Disp: 30 tablet, Rfl: 0   pantoprazole  (PROTONIX ) 40 MG tablet, TAKE 1 TABLET(40 MG) BY MOUTH DAILY, Disp: 30 tablet, Rfl: 1   prochlorperazine  (COMPAZINE ) 10 MG tablet, Take 1 tablet (10 mg total) by mouth every 6 (six) hours as needed for nausea or vomiting., Disp: 60 tablet, Rfl: 3   VITAMIN D  PO, Take 0.25 mcg by mouth daily., Disp: , Rfl:  No current facility-administered medications for this visit.  Facility-Administered Medications Ordered in Other Visits:    0.9 %  sodium chloride  infusion, , Intravenous, Continuous, Megan Hai, MD, Stopped at 08/16/23 1313   sodium chloride  flush (NS) 0.9 % injection 10 mL, 10 mL, Intracatheter, PRN, Demetre Monaco, MD, 10 mL at 08/16/23 1311   Allergies: Allergies  Allergen Reactions   Motrin [Ibuprofen] Other (See Comments)    esrd    REVIEW OF SYSTEMS:   Review of Systems  Constitutional:  Negative for chills, fatigue and fever.  HENT:   Negative for lump/mass, mouth sores, nosebleeds, sore throat and trouble swallowing.    Eyes:  Negative for eye problems.  Respiratory:  Negative for cough and shortness of breath.   Cardiovascular:  Negative for chest pain, leg swelling and palpitations.  Gastrointestinal:  Negative for abdominal pain, constipation, diarrhea, nausea and vomiting.  Genitourinary:  Negative for bladder incontinence, difficulty urinating, dysuria, frequency, hematuria and nocturia.   Musculoskeletal:  Negative for arthralgias, back pain, flank pain, myalgias and neck pain.  Skin:  Negative for itching and rash.  Neurological:  Positive for dizziness and headaches. Negative for numbness.       +tingling hands and feet  Hematological:  Does not bruise/bleed easily.  Psychiatric/Behavioral:  Negative for depression, sleep disturbance and suicidal ideas. The patient is not nervous/anxious.   All other systems reviewed and are negative.    VITALS:   There were no vitals taken for this visit.  Wt Readings from Last 3 Encounters:  08/16/23 179 lb (81.2 kg)  07/26/23 174 lb 3.2  oz (79 kg)  07/17/23 174 lb (78.9 kg)    There is no height or weight on file to calculate BMI.  Performance status (ECOG): 1 - Symptomatic but completely ambulatory  PHYSICAL EXAM:   Physical Exam Vitals and nursing note reviewed. Exam conducted with a chaperone present.  Constitutional:      Appearance: Normal appearance.  Cardiovascular:     Rate and Rhythm: Normal rate and regular rhythm.     Pulses: Normal pulses.     Heart sounds: Normal heart sounds.  Pulmonary:     Effort: Pulmonary effort is normal.     Breath sounds: Normal breath sounds.  Abdominal:     Palpations: Abdomen is soft. There is no hepatomegaly, splenomegaly or mass.     Tenderness: There is no abdominal tenderness.  Musculoskeletal:     Right lower leg: No edema.     Left lower leg: No edema.  Lymphadenopathy:     Cervical: No cervical adenopathy.     Right cervical: No superficial, deep or posterior cervical adenopathy.    Left  cervical: No superficial, deep or posterior cervical adenopathy.     Upper Body:     Right upper body: No supraclavicular or axillary adenopathy.     Left upper body: No supraclavicular or axillary adenopathy.  Neurological:     General: No focal deficit present.     Mental Status: She is alert and oriented to person, place, and time.  Psychiatric:        Mood and Affect: Mood normal.        Behavior: Behavior normal.     LABS:      Latest Ref Rng & Units 08/16/2023    8:08 AM 07/26/2023    8:07 AM 07/18/2023    3:14 PM  CBC  WBC 4.0 - 10.5 K/uL 7.0  5.7  8.4   Hemoglobin 12.0 - 15.0 g/dL 8.3  7.9  8.4   Hematocrit 36.0 - 46.0 % 25.8  25.2  27.0   Platelets 150 - 400 K/uL 176  253  202       Latest Ref Rng & Units 08/16/2023    8:08 AM 07/26/2023    8:07 AM 07/05/2023    9:55 AM  CMP  Glucose 70 - 99 mg/dL 839  892  866   BUN 8 - 23 mg/dL 51  27  28   Creatinine 0.44 - 1.00 mg/dL 89.90  1.58  1.01   Sodium 135 - 145 mmol/L 137  139  139   Potassium 3.5 - 5.1 mmol/L 3.4  4.4  4.1   Chloride 98 - 111 mmol/L 98  97  106   CO2 22 - 32 mmol/L 26  28  21    Calcium  8.9 - 10.3 mg/dL 8.5  9.0  89.9   Total Protein 6.5 - 8.1 g/dL 6.8  7.0  7.1   Total Bilirubin 0.0 - 1.2 mg/dL 0.7  0.4  0.5   Alkaline Phos 38 - 126 U/L 161  150  146   AST 15 - 41 U/L 18  19  18    ALT 0 - 44 U/L 21  17  22       No results found for: CEA1, CEA / No results found for: CEA1, CEA No results found for: PSA1 No results found for: CAN199 No results found for: CAN125  No results found for: TOTALPROTELP, ALBUMINELP, A1GS, A2GS, BETS, BETA2SER, GAMS, MSPIKE, SPEI Lab Results  Component Value Date  TIBC 195 (L) 02/15/2021   TIBC 190 (L) 12/10/2018   TIBC 218 (L) 06/08/2016   FERRITIN 1,742 (H) 02/15/2021   FERRITIN 1,308 (H) 12/10/2018   FERRITIN 126 06/08/2016   IRONPCTSAT 15 02/15/2021   IRONPCTSAT 43 (H) 12/10/2018   IRONPCTSAT 22 06/08/2016   No results found  for: LDH   STUDIES:   No results found.

## 2023-08-16 ENCOUNTER — Inpatient Hospital Stay (HOSPITAL_BASED_OUTPATIENT_CLINIC_OR_DEPARTMENT_OTHER): Payer: Medicare Other | Admitting: Hematology

## 2023-08-16 ENCOUNTER — Inpatient Hospital Stay: Payer: Medicare Other

## 2023-08-16 ENCOUNTER — Other Ambulatory Visit: Payer: Self-pay | Admitting: Family Medicine

## 2023-08-16 ENCOUNTER — Encounter (HOSPITAL_COMMUNITY): Payer: Self-pay | Admitting: Hematology

## 2023-08-16 ENCOUNTER — Inpatient Hospital Stay: Payer: Medicare Other | Attending: Hematology

## 2023-08-16 VITALS — BP 148/77 | HR 85 | Temp 97.4°F | Resp 18

## 2023-08-16 VITALS — BP 150/75 | HR 89 | Temp 97.7°F | Resp 20 | Wt 179.0 lb

## 2023-08-16 DIAGNOSIS — N186 End stage renal disease: Secondary | ICD-10-CM | POA: Insufficient documentation

## 2023-08-16 DIAGNOSIS — C50911 Malignant neoplasm of unspecified site of right female breast: Secondary | ICD-10-CM

## 2023-08-16 DIAGNOSIS — E1140 Type 2 diabetes mellitus with diabetic neuropathy, unspecified: Secondary | ICD-10-CM | POA: Insufficient documentation

## 2023-08-16 DIAGNOSIS — C799 Secondary malignant neoplasm of unspecified site: Secondary | ICD-10-CM

## 2023-08-16 DIAGNOSIS — E1122 Type 2 diabetes mellitus with diabetic chronic kidney disease: Secondary | ICD-10-CM | POA: Insufficient documentation

## 2023-08-16 DIAGNOSIS — C50211 Malignant neoplasm of upper-inner quadrant of right female breast: Secondary | ICD-10-CM

## 2023-08-16 DIAGNOSIS — Z79899 Other long term (current) drug therapy: Secondary | ICD-10-CM | POA: Insufficient documentation

## 2023-08-16 DIAGNOSIS — Z5111 Encounter for antineoplastic chemotherapy: Secondary | ICD-10-CM | POA: Insufficient documentation

## 2023-08-16 DIAGNOSIS — R197 Diarrhea, unspecified: Secondary | ICD-10-CM | POA: Diagnosis not present

## 2023-08-16 DIAGNOSIS — M8580 Other specified disorders of bone density and structure, unspecified site: Secondary | ICD-10-CM | POA: Diagnosis not present

## 2023-08-16 DIAGNOSIS — D649 Anemia, unspecified: Secondary | ICD-10-CM | POA: Diagnosis not present

## 2023-08-16 DIAGNOSIS — K219 Gastro-esophageal reflux disease without esophagitis: Secondary | ICD-10-CM | POA: Diagnosis not present

## 2023-08-16 DIAGNOSIS — R0789 Other chest pain: Secondary | ICD-10-CM | POA: Diagnosis not present

## 2023-08-16 DIAGNOSIS — Z9013 Acquired absence of bilateral breasts and nipples: Secondary | ICD-10-CM | POA: Diagnosis not present

## 2023-08-16 DIAGNOSIS — Z992 Dependence on renal dialysis: Secondary | ICD-10-CM | POA: Diagnosis not present

## 2023-08-16 DIAGNOSIS — I5032 Chronic diastolic (congestive) heart failure: Secondary | ICD-10-CM | POA: Diagnosis not present

## 2023-08-16 DIAGNOSIS — Z95828 Presence of other vascular implants and grafts: Secondary | ICD-10-CM

## 2023-08-16 DIAGNOSIS — Z79811 Long term (current) use of aromatase inhibitors: Secondary | ICD-10-CM | POA: Insufficient documentation

## 2023-08-16 DIAGNOSIS — E782 Mixed hyperlipidemia: Secondary | ICD-10-CM | POA: Diagnosis not present

## 2023-08-16 DIAGNOSIS — Z171 Estrogen receptor negative status [ER-]: Secondary | ICD-10-CM

## 2023-08-16 DIAGNOSIS — Z923 Personal history of irradiation: Secondary | ICD-10-CM | POA: Insufficient documentation

## 2023-08-16 DIAGNOSIS — Z170 Estrogen receptor positive status [ER+]: Secondary | ICD-10-CM | POA: Insufficient documentation

## 2023-08-16 DIAGNOSIS — I132 Hypertensive heart and chronic kidney disease with heart failure and with stage 5 chronic kidney disease, or end stage renal disease: Secondary | ICD-10-CM | POA: Insufficient documentation

## 2023-08-16 LAB — CBC WITH DIFFERENTIAL/PLATELET
Abs Immature Granulocytes: 0.02 10*3/uL (ref 0.00–0.07)
Basophils Absolute: 0.1 10*3/uL (ref 0.0–0.1)
Basophils Relative: 1 %
Eosinophils Absolute: 0.2 10*3/uL (ref 0.0–0.5)
Eosinophils Relative: 2 %
HCT: 25.8 % — ABNORMAL LOW (ref 36.0–46.0)
Hemoglobin: 8.3 g/dL — ABNORMAL LOW (ref 12.0–15.0)
Immature Granulocytes: 0 %
Lymphocytes Relative: 13 %
Lymphs Abs: 0.9 10*3/uL (ref 0.7–4.0)
MCH: 34.9 pg — ABNORMAL HIGH (ref 26.0–34.0)
MCHC: 32.2 g/dL (ref 30.0–36.0)
MCV: 108.4 fL — ABNORMAL HIGH (ref 80.0–100.0)
Monocytes Absolute: 0.7 10*3/uL (ref 0.1–1.0)
Monocytes Relative: 10 %
Neutro Abs: 5.1 10*3/uL (ref 1.7–7.7)
Neutrophils Relative %: 74 %
Platelets: 176 10*3/uL (ref 150–400)
RBC: 2.38 MIL/uL — ABNORMAL LOW (ref 3.87–5.11)
RDW: 19.5 % — ABNORMAL HIGH (ref 11.5–15.5)
WBC: 7 10*3/uL (ref 4.0–10.5)
nRBC: 0 % (ref 0.0–0.2)

## 2023-08-16 LAB — COMPREHENSIVE METABOLIC PANEL
ALT: 21 U/L (ref 0–44)
AST: 18 U/L (ref 15–41)
Albumin: 3.2 g/dL — ABNORMAL LOW (ref 3.5–5.0)
Alkaline Phosphatase: 161 U/L — ABNORMAL HIGH (ref 38–126)
Anion gap: 13 (ref 5–15)
BUN: 51 mg/dL — ABNORMAL HIGH (ref 8–23)
CO2: 26 mmol/L (ref 22–32)
Calcium: 8.5 mg/dL — ABNORMAL LOW (ref 8.9–10.3)
Chloride: 98 mmol/L (ref 98–111)
Creatinine, Ser: 10.09 mg/dL — ABNORMAL HIGH (ref 0.44–1.00)
GFR, Estimated: 4 mL/min — ABNORMAL LOW (ref >60)
Glucose, Bld: 160 mg/dL — ABNORMAL HIGH (ref 70–99)
Potassium: 3.4 mmol/L — ABNORMAL LOW (ref 3.5–5.1)
Sodium: 137 mmol/L (ref 135–145)
Total Bilirubin: 0.7 mg/dL (ref 0.0–1.2)
Total Protein: 6.8 g/dL (ref 6.5–8.1)

## 2023-08-16 LAB — MAGNESIUM: Magnesium: 2 mg/dL (ref 1.7–2.4)

## 2023-08-16 LAB — SAMPLE TO BLOOD BANK

## 2023-08-16 MED ORDER — DOCETAXEL CHEMO INJECTION 160 MG/16ML
60.0000 mg/m2 | Freq: Once | INTRAVENOUS | Status: AC
  Administered 2023-08-16: 112 mg via INTRAVENOUS
  Filled 2023-08-16: qty 11.2

## 2023-08-16 MED ORDER — SODIUM CHLORIDE 0.9 % IV SOLN
150.0000 mg | Freq: Once | INTRAVENOUS | Status: AC
  Administered 2023-08-16: 150 mg via INTRAVENOUS
  Filled 2023-08-16: qty 150

## 2023-08-16 MED ORDER — PALONOSETRON HCL INJECTION 0.25 MG/5ML
0.2500 mg | Freq: Once | INTRAVENOUS | Status: AC
  Administered 2023-08-16: 0.25 mg via INTRAVENOUS
  Filled 2023-08-16: qty 5

## 2023-08-16 MED ORDER — SODIUM CHLORIDE FLUSH 0.9 % IV SOLN
10.0000 mL | Freq: Once | INTRAVENOUS | Status: AC
Start: 2023-08-16 — End: 2023-08-16
  Administered 2023-08-16: 10 mL via INTRAVENOUS
  Filled 2023-08-16: qty 10

## 2023-08-16 MED ORDER — DEXAMETHASONE SODIUM PHOSPHATE 10 MG/ML IJ SOLN
10.0000 mg | Freq: Once | INTRAMUSCULAR | Status: AC
  Administered 2023-08-16: 10 mg via INTRAVENOUS
  Filled 2023-08-16: qty 1

## 2023-08-16 MED ORDER — SODIUM CHLORIDE 0.9 % IV SOLN
200.0000 mg | Freq: Once | INTRAVENOUS | Status: AC
  Administered 2023-08-16: 200 mg via INTRAVENOUS
  Filled 2023-08-16: qty 8

## 2023-08-16 MED ORDER — SODIUM CHLORIDE 0.9 % IV SOLN
161.5000 mg | Freq: Once | INTRAVENOUS | Status: AC
  Administered 2023-08-16: 160 mg via INTRAVENOUS
  Filled 2023-08-16: qty 16

## 2023-08-16 MED ORDER — HEPARIN SOD (PORK) LOCK FLUSH 100 UNIT/ML IV SOLN
500.0000 [IU] | Freq: Once | INTRAVENOUS | Status: AC | PRN
Start: 2023-08-16 — End: 2023-08-16
  Administered 2023-08-16: 500 [IU]

## 2023-08-16 MED ORDER — SODIUM CHLORIDE 0.9% FLUSH
10.0000 mL | INTRAVENOUS | Status: DC | PRN
  Administered 2023-08-16: 10 mL

## 2023-08-16 MED ORDER — SODIUM CHLORIDE 0.9 % IV SOLN: INTRAVENOUS | Status: DC

## 2023-08-16 NOTE — Progress Notes (Signed)
 Patient presents today for treatment, labs are within treatment parameters, okay for treatment per Dr. Rogers. Patient tolerated chemotherapy with no complaints voiced. Side effects with management reviewed understanding verbalized. Port site clean and dry with no bruising or swelling noted at site. Good blood return noted before and after administration of chemotherapy. Band aid applied. Patient left in satisfactory condition with VSS and no s/s of distress noted.

## 2023-08-16 NOTE — Progress Notes (Signed)
 Patient has been examined by Dr. Ellin Saba. Vital signs and labs have been reviewed by MD - ANC, Creatinine, LFTs, hemoglobin, and platelets are within treatment parameters per M.D. - pt may proceed with treatment.  Primary RN and pharmacy notified.

## 2023-08-16 NOTE — Patient Instructions (Signed)
 [facility] [place] - A DEPT OF [facility]  Discharge Instructions: Thank you for choosing [facility] to provide your oncology and hematology care.  If you have a lab appointment with the Cancer Center - please note that after April 8th, 2024, all labs will be drawn in the cancer center.  You do not have to check in or register with the main entrance as you have in the past but will complete your check-in in the cancer center.  Wear comfortable clothing and clothing appropriate for easy access to any Portacath or PICC line.   We strive to give you quality time with your provider. You may need to reschedule your appointment if you arrive late (15 or more minutes).  Arriving late affects you and other patients whose appointments are after yours.  Also, if you miss three or more appointments without notifying the office, you may be dismissed from the clinic at the provider's discretion.      For prescription refill requests, have your pharmacy contact our office and allow 72 hours for refills to be completed.    Today you received the following chemotherapy and/or immunotherapy agents Keytruda , Taxol, and Carboplatin , return as scheduled.   To help prevent nausea and vomiting after your treatment, we encourage you to take your nausea medication as directed.  BELOW ARE SYMPTOMS THAT SHOULD BE REPORTED IMMEDIATELY: *FEVER GREATER THAN 100.4 F (38 C) OR HIGHER *CHILLS OR SWEATING *NAUSEA AND VOMITING THAT IS NOT CONTROLLED WITH YOUR NAUSEA MEDICATION *UNUSUAL SHORTNESS OF BREATH *UNUSUAL BRUISING OR BLEEDING *URINARY PROBLEMS (pain or burning when urinating, or frequent urination) *BOWEL PROBLEMS (unusual diarrhea, constipation, pain near the anus) TENDERNESS IN MOUTH AND THROAT WITH OR WITHOUT PRESENCE OF ULCERS (sore throat, sores in mouth, or a toothache) UNUSUAL RASH, SWELLING OR PAIN  UNUSUAL VAGINAL DISCHARGE OR ITCHING   Items with * indicate a  potential emergency and should be followed up as soon as possible or go to the Emergency Department if any problems should occur.  Please show the CHEMOTHERAPY ALERT CARD or IMMUNOTHERAPY ALERT CARD at check-in to the Emergency Department and triage nurse.  Should you have questions after your visit or need to cancel or reschedule your appointment, please contact [facility] [place] - A DEPT OF [name] [phone]  and follow the prompts.  Office hours are 8:00 a.m. to 4:30 p.m. Monday - Friday. Please note that voicemails left after 4:00 p.m. may not be returned until the following business day.  We are closed weekends and major holidays. You have access to a nurse at all times for urgent questions. Please call the main number to the clinic [phone] and follow the prompts.  For any non-urgent questions, you may also contact your provider using MyChart. We now offer e-Visits for anyone 67 and older to request care online for non-urgent symptoms. For details visit [email or web address].   Also download the MyChart app! Go to the app store, search MyChart, open the app, select [place], and log in with your MyChart username and password.

## 2023-08-16 NOTE — Patient Instructions (Signed)
 [facility] at [facility] Discharge Instructions   You were seen and examined today by Dr. Ellin Saba.  He reviewed the results of your lab work which are normal/stable.   We will proceed with your treatment today.  Return as scheduled.    Thank you for choosing [facility] at [facility] to provide your oncology and hematology care.  To afford each patient quality time with our provider, please arrive at least 15 minutes before your scheduled appointment time.   If you have a lab appointment with the Cancer Center please come in thru the Main Entrance and check in at the main information desk.  You need to re-schedule your appointment should you arrive 10 or more minutes late.  We strive to give you quality time with our providers, and arriving late affects you and other patients whose appointments are after yours.  Also, if you no show three or more times for appointments you may be dismissed from the clinic at the providers discretion.     Again, thank you for choosing [facility].  Our hope is that these requests will decrease the amount of time that you wait before being seen by our physicians.       _____________________________________________________________  Should you have questions after your visit to [facility], please contact our office at [phone] and follow the prompts.  Our office hours are 8:00 a.m. and 4:30 p.m. Monday - Friday.  Please note that voicemails left after 4:00 p.m. may not be returned until the following business day.  We are closed weekends and major holidays.  You do have access to a nurse 24-7, just call the main number to the clinic [phone] and do not press any options, hold on the line and a nurse will answer the phone.    For prescription refill requests, have your pharmacy contact our office and allow 72 hours.    Due to Covid, you will need to wear a mask upon entering  the hospital. If you do not have a mask, a mask will be given to you at the Main Entrance upon arrival. For doctor visits, patients may have 1 support person age 17 or older with them. For treatment visits, patients can not have anyone with them due to social distancing guidelines and our immunocompromised population.

## 2023-08-17 ENCOUNTER — Other Ambulatory Visit: Payer: Self-pay

## 2023-08-17 ENCOUNTER — Inpatient Hospital Stay: Payer: Medicare Other

## 2023-08-17 VITALS — BP 136/69 | HR 88 | Temp 97.5°F | Resp 18

## 2023-08-17 DIAGNOSIS — Z170 Estrogen receptor positive status [ER+]: Secondary | ICD-10-CM | POA: Diagnosis not present

## 2023-08-17 DIAGNOSIS — C50211 Malignant neoplasm of upper-inner quadrant of right female breast: Secondary | ICD-10-CM | POA: Diagnosis not present

## 2023-08-17 DIAGNOSIS — E119 Type 2 diabetes mellitus without complications: Secondary | ICD-10-CM | POA: Diagnosis not present

## 2023-08-17 DIAGNOSIS — Z79811 Long term (current) use of aromatase inhibitors: Secondary | ICD-10-CM | POA: Diagnosis not present

## 2023-08-17 DIAGNOSIS — M8580 Other specified disorders of bone density and structure, unspecified site: Secondary | ICD-10-CM | POA: Diagnosis not present

## 2023-08-17 DIAGNOSIS — E1129 Type 2 diabetes mellitus with other diabetic kidney complication: Secondary | ICD-10-CM | POA: Diagnosis not present

## 2023-08-17 DIAGNOSIS — D631 Anemia in chronic kidney disease: Secondary | ICD-10-CM | POA: Diagnosis not present

## 2023-08-17 DIAGNOSIS — N186 End stage renal disease: Secondary | ICD-10-CM | POA: Diagnosis not present

## 2023-08-17 DIAGNOSIS — Z992 Dependence on renal dialysis: Secondary | ICD-10-CM | POA: Diagnosis not present

## 2023-08-17 DIAGNOSIS — Z5111 Encounter for antineoplastic chemotherapy: Secondary | ICD-10-CM | POA: Diagnosis not present

## 2023-08-17 DIAGNOSIS — R197 Diarrhea, unspecified: Secondary | ICD-10-CM | POA: Diagnosis not present

## 2023-08-17 DIAGNOSIS — C50911 Malignant neoplasm of unspecified site of right female breast: Secondary | ICD-10-CM

## 2023-08-17 DIAGNOSIS — N2581 Secondary hyperparathyroidism of renal origin: Secondary | ICD-10-CM | POA: Diagnosis not present

## 2023-08-17 MED ORDER — PEGFILGRASTIM-FPGK 6 MG/0.6ML ~~LOC~~ SOSY
6.0000 mg | PREFILLED_SYRINGE | Freq: Once | SUBCUTANEOUS | Status: AC
Start: 2023-08-17 — End: 2023-08-17
  Administered 2023-08-17: 6 mg via SUBCUTANEOUS
  Filled 2023-08-17: qty 0.6

## 2023-08-17 NOTE — Patient Instructions (Signed)
 [facility] [place] - A DEPT OF [facility]  Discharge Instructions: Thank you for choosing [facility] to provide your oncology and hematology care.  If you have a lab appointment with the Cancer Center - please note that after April 8th, 2024, all labs will be drawn in the cancer center.  You do not have to check in or register with the main entrance as you have in the past but will complete your check-in in the cancer center.  Wear comfortable clothing and clothing appropriate for easy access to any Portacath or PICC line.   We strive to give you quality time with your provider. You may need to reschedule your appointment if you arrive late (15 or more minutes).  Arriving late affects you and other patients whose appointments are after yours.  Also, if you miss three or more appointments without notifying the office, you may be dismissed from the clinic at the provider's discretion.      For prescription refill requests, have your pharmacy contact our office and allow 72 hours for refills to be completed.    Today you received the following chemotherapy and/or immunotherapy agents Stimufend.  Pegfilgrastim Injection What is this medication? PEGFILGRASTIM (PEG fil gra stim) lowers the risk of infection in people who are receiving chemotherapy. It works by Systems analyst make more white blood cells, which protects your body from infection. It may also be used to help people who have been exposed to high doses of radiation. This medicine may be used for other purposes; ask your health care provider or pharmacist if you have questions. COMMON BRAND NAME(S): Cherly Hensen, Neulasta, Nyvepria, Stimufend, UDENYCA, UDENYCA ONBODY, Ziextenzo What should I tell my care team before I take this medication? They need to know if you have any of these conditions: Kidney disease Latex allergy Ongoing radiation therapy Sickle cell disease Skin reactions to  acrylic adhesives (On-Body Injector only) An unusual or allergic reaction to pegfilgrastim, filgrastim, other medications, foods, dyes, or preservatives Pregnant or trying to get pregnant Breast-feeding How should I use this medication? This medication is for injection under the skin. If you get this medication at home, you will be taught how to prepare and give the pre-filled syringe or how to use the On-body Injector. Refer to the patient Instructions for Use for detailed instructions. Use exactly as directed. Tell your care team immediately if you suspect that the On-body Injector may not have performed as intended or if you suspect the use of the On-body Injector resulted in a missed or partial dose. It is important that you put your used needles and syringes in a special sharps container. Do not put them in a trash can. If you do not have a sharps container, call your pharmacist or care team to get one. Talk to your care team about the use of this medication in children. While this medication may be prescribed for selected conditions, precautions do apply. Overdosage: If you think you have taken too much of this medicine contact a poison control center or emergency room at once. NOTE: This medicine is only for you. Do not share this medicine with others. What if I miss a dose? It is important not to miss your dose. Call your care team if you miss your dose. If you miss a dose due to an On-body Injector failure or leakage, a new dose should be administered as soon as possible using a single prefilled syringe for  manual use. What may interact with this medication? Interactions have not been studied. This list may not describe all possible interactions. Give your health care provider a list of all the medicines, herbs, non-prescription drugs, or dietary supplements you use. Also tell them if you smoke, drink alcohol, or use illegal drugs. Some items may interact with your medicine. What should I  watch for while using this medication? Your condition will be monitored carefully while you are receiving this medication. You may need blood work done while you are taking this medication. Talk to your care team about your risk of cancer. You may be more at risk for certain types of cancer if you take this medication. If you are going to need a MRI, CT scan, or other procedure, tell your care team that you are using this medication (On-Body Injector only). What side effects may I notice from receiving this medication? Side effects that you should report to your care team as soon as possible: Allergic reactions--skin rash, itching, hives, swelling of the face, lips, tongue, or throat Capillary leak syndrome--stomach or muscle pain, unusual weakness or fatigue, feeling faint or lightheaded, decrease in the amount of urine, swelling of the ankles, hands, or feet, trouble breathing High white blood cell level--fever, fatigue, trouble breathing, night sweats, change in vision, weight loss Inflammation of the aorta--fever, fatigue, back, chest, or stomach pain, severe headache Kidney injury (glomerulonephritis)--decrease in the amount of urine, red or dark Joannah Gitlin urine, foamy or bubbly urine, swelling of the ankles, hands, or feet Shortness of breath or trouble breathing Spleen injury--pain in upper left stomach or shoulder Unusual bruising or bleeding Side effects that usually do not require medical attention (report to your care team if they continue or are bothersome): Bone pain Pain in the hands or feet This list may not describe all possible side effects. Call your doctor for medical advice about side effects. You may report side effects to FDA at 1-800-FDA-1088. Where should I keep my medication? Keep out of the reach of children. If you are using this medication at home, you will be instructed on how to store it. Throw away any unused medication after the expiration date on the label. NOTE:  This sheet is a summary. It may not cover all possible information. If you have questions about this medicine, talk to your doctor, pharmacist, or health care provider.  2024 Elsevier/Gold Standard (2021-07-01 00:00:00)        To help prevent nausea and vomiting after your treatment, we encourage you to take your nausea medication as directed.  BELOW ARE SYMPTOMS THAT SHOULD BE REPORTED IMMEDIATELY: *FEVER GREATER THAN 100.4 F (38 C) OR HIGHER *CHILLS OR SWEATING *NAUSEA AND VOMITING THAT IS NOT CONTROLLED WITH YOUR NAUSEA MEDICATION *UNUSUAL SHORTNESS OF BREATH *UNUSUAL BRUISING OR BLEEDING *URINARY PROBLEMS (pain or burning when urinating, or frequent urination) *BOWEL PROBLEMS (unusual diarrhea, constipation, pain near the anus) TENDERNESS IN MOUTH AND THROAT WITH OR WITHOUT PRESENCE OF ULCERS (sore throat, sores in mouth, or a toothache) UNUSUAL RASH, SWELLING OR PAIN  UNUSUAL VAGINAL DISCHARGE OR ITCHING   Items with * indicate a potential emergency and should be followed up as soon as possible or go to the Emergency Department if any problems should occur.  Please show the CHEMOTHERAPY ALERT CARD or IMMUNOTHERAPY ALERT CARD at check-in to the Emergency Department and triage nurse.  Should you have questions after your visit or need to cancel or reschedule your appointment, please contact [facility] [place] -  A DEPT OF Eligha Bridegroom [facility] [phone]  and follow the prompts.  Office hours are 8:00 a.m. to 4:30 p.m. Monday - Friday. Please note that voicemails left after 4:00 p.m. may not be returned until the following business day.  We are closed weekends and major holidays. You have access to a nurse at all times for urgent questions. Please call the main number to the clinic [phone] and follow the prompts.  For any non-urgent questions, you may also contact your provider using MyChart. We now offer e-Visits for anyone 41 and older to request care online for  non-urgent symptoms. For details visit [email or web address].   Also download the MyChart app! Go to the app store, search "MyChart", open the app, select [place], and log in with your MyChart username and password.

## 2023-08-17 NOTE — Progress Notes (Signed)
Patient tolerated Stimufend injection with no complaints voiced.  Site clean and dry with no bruising or swelling noted.  No complaints of pain.  Discharged with vital signs stable and no signs or symptoms of distress noted.

## 2023-08-20 DIAGNOSIS — N186 End stage renal disease: Secondary | ICD-10-CM | POA: Diagnosis not present

## 2023-08-20 DIAGNOSIS — D631 Anemia in chronic kidney disease: Secondary | ICD-10-CM | POA: Diagnosis not present

## 2023-08-20 DIAGNOSIS — E1129 Type 2 diabetes mellitus with other diabetic kidney complication: Secondary | ICD-10-CM | POA: Diagnosis not present

## 2023-08-20 DIAGNOSIS — N2581 Secondary hyperparathyroidism of renal origin: Secondary | ICD-10-CM | POA: Diagnosis not present

## 2023-08-20 DIAGNOSIS — Z992 Dependence on renal dialysis: Secondary | ICD-10-CM | POA: Diagnosis not present

## 2023-08-20 DIAGNOSIS — E119 Type 2 diabetes mellitus without complications: Secondary | ICD-10-CM | POA: Diagnosis not present

## 2023-08-22 DIAGNOSIS — N186 End stage renal disease: Secondary | ICD-10-CM | POA: Diagnosis not present

## 2023-08-22 DIAGNOSIS — E1129 Type 2 diabetes mellitus with other diabetic kidney complication: Secondary | ICD-10-CM | POA: Diagnosis not present

## 2023-08-22 DIAGNOSIS — Z992 Dependence on renal dialysis: Secondary | ICD-10-CM | POA: Diagnosis not present

## 2023-08-22 DIAGNOSIS — E119 Type 2 diabetes mellitus without complications: Secondary | ICD-10-CM | POA: Diagnosis not present

## 2023-08-22 DIAGNOSIS — D631 Anemia in chronic kidney disease: Secondary | ICD-10-CM | POA: Diagnosis not present

## 2023-08-22 DIAGNOSIS — N2581 Secondary hyperparathyroidism of renal origin: Secondary | ICD-10-CM | POA: Diagnosis not present

## 2023-08-24 DIAGNOSIS — N186 End stage renal disease: Secondary | ICD-10-CM | POA: Diagnosis not present

## 2023-08-24 DIAGNOSIS — E1129 Type 2 diabetes mellitus with other diabetic kidney complication: Secondary | ICD-10-CM | POA: Diagnosis not present

## 2023-08-24 DIAGNOSIS — D631 Anemia in chronic kidney disease: Secondary | ICD-10-CM | POA: Diagnosis not present

## 2023-08-24 DIAGNOSIS — E119 Type 2 diabetes mellitus without complications: Secondary | ICD-10-CM | POA: Diagnosis not present

## 2023-08-24 DIAGNOSIS — N2581 Secondary hyperparathyroidism of renal origin: Secondary | ICD-10-CM | POA: Diagnosis not present

## 2023-08-24 DIAGNOSIS — Z992 Dependence on renal dialysis: Secondary | ICD-10-CM | POA: Diagnosis not present

## 2023-08-27 ENCOUNTER — Other Ambulatory Visit: Payer: Self-pay

## 2023-08-27 DIAGNOSIS — N186 End stage renal disease: Secondary | ICD-10-CM | POA: Diagnosis not present

## 2023-08-27 DIAGNOSIS — N2581 Secondary hyperparathyroidism of renal origin: Secondary | ICD-10-CM | POA: Diagnosis not present

## 2023-08-27 DIAGNOSIS — D631 Anemia in chronic kidney disease: Secondary | ICD-10-CM | POA: Diagnosis not present

## 2023-08-27 DIAGNOSIS — E1129 Type 2 diabetes mellitus with other diabetic kidney complication: Secondary | ICD-10-CM | POA: Diagnosis not present

## 2023-08-27 DIAGNOSIS — E119 Type 2 diabetes mellitus without complications: Secondary | ICD-10-CM | POA: Diagnosis not present

## 2023-08-27 DIAGNOSIS — Z992 Dependence on renal dialysis: Secondary | ICD-10-CM | POA: Diagnosis not present

## 2023-08-28 ENCOUNTER — Ambulatory Visit (INDEPENDENT_AMBULATORY_CARE_PROVIDER_SITE_OTHER): Payer: Medicare Other

## 2023-08-28 ENCOUNTER — Ambulatory Visit (INDEPENDENT_AMBULATORY_CARE_PROVIDER_SITE_OTHER): Payer: Medicare Other | Admitting: Vascular Surgery

## 2023-08-28 ENCOUNTER — Encounter: Payer: Self-pay | Admitting: Vascular Surgery

## 2023-08-28 VITALS — BP 129/72 | HR 85 | Ht 62.0 in | Wt 177.0 lb

## 2023-08-28 DIAGNOSIS — Z992 Dependence on renal dialysis: Secondary | ICD-10-CM

## 2023-08-28 DIAGNOSIS — N186 End stage renal disease: Secondary | ICD-10-CM

## 2023-08-28 NOTE — Progress Notes (Signed)
 [facility] [place]  ASSESSMENT / PLAN: 63 y.o. female with left upper extremity discomfort, paresthesias, swelling.  This is ipsilateral to a well-established left radiocephalic arteriovenous fistula with distal palpable pulse.  I feel like access related hand ischemia is unlikely given the palpable pulse.  She does show significant changes in her arterial waveforms with compression of the fistula.  Recent fistulogram shows no evidence of central venous stenosis to explain her swelling.  I encouraged her to be evaluated by hand surgeon to look for possible nerve compression syndromes.  She can continue gentle compression of the arm to treat swelling.  If no source for her symptoms is found, ligation of the fistula is an option, although not a great 1.  She will follow-up with me in several months to review the above.  CHIEF COMPLAINT: Left hand tingling and swelling  HISTORY OF PRESENT ILLNESS: Megan [name] is a 63 y.o. female referred to clinic for evaluation of possible steal syndrome.  Patient has a longstanding history of left upper extremity swelling, having been seen by my partners and PAs in the past.  She had a left radiocephalic fistula placed many years ago in [place] .  This is working very well.  The patient notices swelling throughout the left arm at all times.  Over the last 9 months, the patient has noticed paresthesias and discomfort in the tips of her fingers.  The patient has been receiving chemotherapy for recurrent breast cancer.  She had a fistulogram recently which showed no evidence of central venous stenosis.  I personally reviewed the films.  Past Medical History:  Diagnosis Date   (HFpEF) heart failure with preserved ejection fraction (HCC)    a. 01/2019 Echo: EF 55-60%, mild conc LVH. DD.  Torn MV chordae.   Anemia    Atypical chest pain    a. 08/2018 MV: EF 59%, no ischemia; b. 02/2019 Cath: nonobs dzs.   Blood transfusion without  reported diagnosis    Breast cancer (HCC) 10/12/2020   Cataract    ESRD (end stage renal disease) on dialysis [facility])    a. HD T, T, S   Essential hypertension, benign    GERD (gastroesophageal reflux disease)    Headache    Hemorrhoids    Mixed hyperlipidemia    Morbid obesity (HCC)    Non-obstructive CAD (coronary artery disease)    a. 02/2019 CathL LM nl, LAD 72m, LCX nl, RCA 25p, EF 55-65%.   PONV (postoperative nausea and vomiting)    Renal insufficiency    S/P colonoscopy 08/2009   Dr. Rollin: sessile polyp (benign lymphoid), large hemorrhoids, repeat 5-10 years   Temporal arteritis (HCC)    Type 2 diabetes mellitus (HCC)    Wears glasses     Past Surgical History:  Procedure Laterality Date   ABDOMINAL HYSTERECTOMY     APPENDECTOMY     ARTERY BIOPSY N/A 05/09/2018   Procedure: RIGHT TEMPORAL ARTERY BIOPSY;  Surgeon: Kimble Agent, MD;  Location: [facility] OR;  Service: General;  Laterality: N/A;   BREAST BIOPSY Right 06/15/2020   Procedure: RIGHT BREAST BIOPSY;  Surgeon: Mavis Anes, MD;  Location: [facility];  Service: General;  Laterality: Right;   CATARACT EXTRACTION W/PHACO Left 02/09/2017   Procedure: CATARACT EXTRACTION PHACO AND INTRAOCULAR LENS PLACEMENT LEFT EYE;  Surgeon: Perley Hamilton, MD;  Location: [facility];  Service: Ophthalmology;  Laterality: Left;  CDE: 4.89   CATARACT EXTRACTION W/PHACO Right 06/04/2017   Procedure: CATARACT EXTRACTION PHACO  AND INTRAOCULAR LENS PLACEMENT (IOC);  Surgeon: Perley Hamilton, MD;  Location: [facility];  Service: Ophthalmology;  Laterality: Right;  CDE: 4.12   CHOLECYSTECTOMY  09/29/2011   Procedure: LAPAROSCOPIC CHOLECYSTECTOMY;  Surgeon: Oneil DELENA Budge, MD;  Location: [facility];  Service: General;  Laterality: N/A;   COLONOSCOPY  08/2009   Dr. Rollin: sessile polyp (benign lymphoid), large hemorrhoids, repeat 5-10 years   COLONOSCOPY N/A 06/12/2016   prominent hemorrhoids   COLONOSCOPY WITH PROPOFOL  N/A 06/16/2021   Procedure: COLONOSCOPY WITH PROPOFOL ;   Surgeon: Shaaron Lamar HERO, MD;  [facility];  Service: Endoscopy;  Laterality: N/A;  9:30am (dialysis pt)   ESOPHAGOGASTRODUODENOSCOPY  09/05/2011   MFM:Dfjoo hiatal hernia; remainder of exam normal. No explanation for patient's abdominal pain with today's examination   ESOPHAGOGASTRODUODENOSCOPY N/A 12/17/2013   Dr. Shaaron: gastric erythema, erosion, mild chronic inflammation on path    ESOPHAGOGASTRODUODENOSCOPY (EGD) WITH PROPOFOL  N/A 06/16/2021   Procedure: ESOPHAGOGASTRODUODENOSCOPY (EGD) WITH PROPOFOL ;  Surgeon: Shaaron Lamar HERO, MD;  [facility];  Service: Endoscopy;  Laterality: N/A;   EXCISION OF BREAST BIOPSY Right 10/12/2020   Procedure: EXCISION OF RIGHT BREAST BIOPSY;  Surgeon: Budge Oneil, MD;  Location: [facility];  Service: General;  Laterality: Right;   IR DIALY SHUNT INTRO NEEDLE/INTRACATH INITIAL W/IMG LEFT Left 07/17/2023   LAPAROSCOPIC APPENDECTOMY  09/29/2011   Procedure: APPENDECTOMY LAPAROSCOPIC;  Surgeon: Oneil DELENA Budge, MD;  Location: [facility];  Service: General;;  incidental appendectomy   LEFT HEART CATH AND CORONARY ANGIOGRAPHY N/A 02/28/2019   Procedure: LEFT HEART CATH AND CORONARY ANGIOGRAPHY;  Surgeon: Dann Candyce RAMAN, MD;  Location: [facility] INVASIVE CV LAB;  Service: Cardiovascular;  Laterality: N/A;   MASS EXCISION Right 01/18/2023   Procedure: EXCISION MASS, RIGHT CHEST S/P MASTECTOMY;  Surgeon: Budge Oneil, MD;  Location: [facility];  Service: General;  Laterality: Right;   MASTECTOMY MODIFIED RADICAL Right 02/18/2020   Procedure: MASTECTOMY MODIFIED RADICAL;  Surgeon: Budge Oneil, MD;  Location: [facility];  Service: General;  Laterality: Right;   MASTECTOMY, PARTIAL Right 07/13/2020   Procedure: RIGHT PARTIAL MASTECTOMY;  Surgeon: Budge Oneil, MD;  Location: [facility];  Service: General;  Laterality: Right;   PARTIAL MASTECTOMY WITH NEEDLE LOCALIZATION AND AXILLARY SENTINEL LYMPH NODE BX Right 09/18/2018   Procedure: RIGHT PARTIAL MASTECTOMY AFTER  NEEDLE LOCALIZATION, SENTINEL LYMPH NODE BIOPSY RIGHT AXILLA;  Surgeon: Budge Oneil, MD;  Location: [facility];  Service: General;  Laterality: Right;   POLYPECTOMY  06/16/2021   Procedure: POLYPECTOMY;  Surgeon: Shaaron Lamar HERO, MD;  [facility];  Service: Endoscopy;;   PORTACATH PLACEMENT Right 06/07/2023   Procedure: INSERTION PORT-A-CATH, RIGHT  (DIALYSIS ACCESS ON LEFT);  Surgeon: Budge Oneil, MD;  Location: [facility];  Service: General;  Laterality: Right;   SIMPLE MASTECTOMY WITH AXILLARY SENTINEL NODE BIOPSY Left 06/15/2020   Procedure: LEFT SIMPLE MASTECTOMY;  Surgeon: Budge Oneil, MD;  Location: [facility];  Service: General;  Laterality: Left;    Family History  Problem Relation Age of Onset   Hypertension Mother    Coronary artery disease Mother    Diabetes Mother    Hypertension Sister    Coronary artery disease Sister    Hypertension Brother    Heart attack Father    Hypertension Son    Heart attack Maternal Aunt    Hypertension Maternal Aunt    Diabetes Maternal Aunt    Heart attack Maternal Uncle    Hypertension Maternal Uncle    Diabetes  Maternal Uncle    Heart attack Paternal Aunt    Hypertension Paternal Aunt    Diabetes Paternal Aunt    Heart attack Paternal Uncle    Hypertension Paternal Uncle    Diabetes Paternal Uncle    Heart attack Maternal Grandmother    Heart attack Maternal Grandfather    Heart attack Paternal Grandmother    Heart attack Paternal Grandfather    Colon cancer Neg Hx     Social History   Socioeconomic History   Marital status: Married    Spouse name: Not on file   Number of children: Not on file   Years of education: Not on file   Highest education level: 12th grade  Occupational History   Occupation: Systems Developer: [employer]  Tobacco Use   Smoking status: Never    Passive exposure: Never   Smokeless tobacco: Never  Vaping Use   Vaping status: Never Used  Substance and Sexual Activity   Alcohol use:  No   Drug use: No   Sexual activity: Yes    Birth control/protection: Surgical  Other Topics Concern   Not on file  Social History Narrative   Works at [employer] in [place].    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   Also has to get items from high shelves-causes achy pain in shoulder area      Married.   Children are grown, out of house.   Social Drivers of Corporate Investment Banker Strain: Low Risk  (06/08/2023)   Overall Financial Resource Strain (CARDIA)    Difficulty of Paying Living Expenses: Not hard at all  Food Insecurity: No Food Insecurity (06/08/2023)   Hunger Vital Sign    Worried About Running Out of Food in the Last Year: Never true    Ran Out of Food in the Last Year: Never true  Transportation Needs: No Transportation Needs (06/08/2023)   PRAPARE - Administrator, Civil Service (Medical): No    Lack of Transportation (Non-Medical): No  Physical Activity: Inactive (06/08/2023)   Exercise Vital Sign    Days of Exercise per Week: 0 days    Minutes of Exercise per Session: 0 min  Stress: No Stress Concern Present (06/08/2023)   Megan [name] of Occupational Health - Occupational Stress Questionnaire    Feeling of Stress : Not at all  Social Connections: Socially Integrated (06/08/2023)   Social Connection and Isolation Panel [NHANES]    Frequency of Communication with Friends and Family: More than three times a week    Frequency of Social Gatherings with Friends and Family: Once a week    Attends Religious Services: More than 4 times per year    Active Member of [employer] or Organizations: No    Attends Engineer, Structural: More than 4 times per year    Marital Status: Married  Catering Manager Violence: Not At Risk (09/14/2022)   Humiliation, Afraid, Rape, and Kick questionnaire    Fear of Current or Ex-Partner: No    Emotionally Abused: No    Physically Abused: No    Sexually Abused: No    Allergies  Allergen  Reactions   Motrin [Ibuprofen] Other (See Comments)    esrd    Current Outpatient Medications  Medication Sig Dispense Refill   acetaminophen  (TYLENOL ) 500 MG tablet Take 500-1,000 mg by mouth every 6 (six) hours as needed (pain.).     amLODipine  (NORVASC )  10 MG tablet TAKE 1 TABLET BY MOUTH AT BEDTIME 90 tablet 3   anastrozole  (ARIMIDEX ) 1 MG tablet TAKE 1 TABLET BY MOUTH DAILY 90 tablet 3   aspirin  EC 81 MG tablet Take 81 mg by mouth in the morning.     atorvastatin  (LIPITOR) 20 MG tablet TAKE 1 TABLET(20 MG) BY MOUTH DAILY 90 tablet 0   B Complex-C-Zn-Folic Acid  (DIALYVITE  800-ZINC  15) 0.8 MG TABS Take 1 tablet by mouth in the morning.     cinacalcet  (SENSIPAR ) 30 MG tablet Take 2 tablets (60 mg total) by mouth daily. (Patient taking differently: Take 90 mg by mouth daily with breakfast. With a meal) 90 tablet 0   Darbepoetin Alfa  (ARANESP , ALBUMIN  FREE, IJ) Darbepoetin Alfa  (Aranesp )     fluticasone  (FLONASE ) 50 MCG/ACT nasal spray Place 2 sprays into both nostrils daily. 16 g 6   gabapentin  (NEURONTIN ) 300 MG capsule TAKE 1 CAPSULE(300 MG) BY MOUTH AT BEDTIME 90 capsule 3   iron  sucrose (VENOFER ) 20 MG/ML injection Inject 50 mg into the vein once a week.     lanthanum  (FOSRENOL ) 1000 MG chewable tablet Chew 2,000-3,000 mg by mouth See admin instructions. Take 3 tablets (3000 mg) by mouth with meals and take 2 tablets (2000 mg) with snacks     lidocaine -prilocaine  (EMLA ) cream Apply 1 application  topically every Monday, Wednesday, and Friday with hemodialysis.     meclizine  (ANTIVERT ) 25 MG tablet Take 1 tablet (25 mg total) by mouth 3 (three) times daily as needed for dizziness. 30 tablet 1   nebivolol  (BYSTOLIC ) 10 MG tablet TAKE 1 TABLET BY MOUTH IN THE MORNING AND AT BEDTIME 180 tablet 0   nitroGLYCERIN  (NITROSTAT ) 0.4 MG SL tablet Place 1 tablet (0.4 mg total) under the tongue every 5 (five) minutes x 3 doses as needed for chest pain (if no relief after 3rd dose, proceed to ED or call  [phone]). 25 tablet 3   ondansetron  (ZOFRAN ) 4 MG tablet Take 2 tablets (8 mg total) by mouth every 8 (eight) hours as needed for nausea or vomiting. 90 tablet 0   oxyCODONE  (ROXICODONE ) 5 MG immediate release tablet Take 1 tablet (5 mg total) by mouth every 6 (six) hours as needed. 30 tablet 0   pantoprazole  (PROTONIX ) 40 MG tablet TAKE 1 TABLET(40 MG) BY MOUTH DAILY 30 tablet 1   prochlorperazine  (COMPAZINE ) 10 MG tablet Take 1 tablet (10 mg total) by mouth every 6 (six) hours as needed for nausea or vomiting. 60 tablet 3   VITAMIN D  PO Take 0.25 mcg by mouth daily.     No current facility-administered medications for this visit.    PHYSICAL EXAM Vitals:   08/28/23 1231  BP: 129/72  Pulse: 85  Weight: 177 lb (80.3 kg)  Height: 5' 2 (1.575 m)   Well-appearing woman in no distress Regular rate and rhythm Unlabored breathing Left arm with radiocephalic fistula with some aneurysmal change consistent with chronic cannulation Palpable radial pulse beyond the fistula Fingers are warm and well-perfused without ischemic ulcer Negative Tinel's sign  PERTINENT LABORATORY AND RADIOLOGIC DATA  Most recent CBC    Latest Ref Rng & Units 08/16/2023    8:08 AM 07/26/2023    8:07 AM 07/18/2023    3:14 PM  CBC  WBC 4.0 - 10.5 K/uL 7.0  5.7  8.4   Hemoglobin 12.0 - 15.0 g/dL 8.3  7.9  8.4   Hematocrit 36.0 - 46.0 % 25.8  25.2  27.0   Platelets 150 -  400 K/uL 176  253  202      Most recent CMP    Latest Ref Rng & Units 08/16/2023    8:08 AM 07/26/2023    8:07 AM 07/05/2023    9:55 AM  CMP  Glucose 70 - 99 mg/dL 839  892  866   BUN 8 - 23 mg/dL 51  27  28   Creatinine 0.44 - 1.00 mg/dL 89.90  1.58  1.01   Sodium 135 - 145 mmol/L 137  139  139   Potassium 3.5 - 5.1 mmol/L 3.4  4.4  4.1   Chloride 98 - 111 mmol/L 98  97  106   CO2 22 - 32 mmol/L 26  28  21    Calcium  8.9 - 10.3 mg/dL 8.5  9.0  89.9   Total Protein 6.5 - 8.1 g/dL 6.8  7.0  7.1   Total Bilirubin 0.0 - 1.2 mg/dL 0.7  0.4  0.5    Alkaline Phos 38 - 126 U/L 161  150  146   AST 15 - 41 U/L 18  19  18    ALT 0 - 44 U/L 21  17  22      Renal function Estimated Creatinine Clearance: 5.7 mL/min (A) (by C-G formula based on SCr of 10.09 mg/dL (H)).  Hgb A1c MFr Bld (% of total Hgb)  Date Value  06/12/2023 5.3    LDL Cholesterol (Calc)  Date Value Ref Range Status  11/25/2020 72 mg/dL (calc) Final    Comment:    Reference range: <100 . Desirable range <100 mg/dL for primary prevention;   <70 mg/dL for patients with CHD or diabetic patients  with > or = 2 CHD risk factors. SABRA LDL-C is now calculated using the Martin-Hopkins  calculation, which is a validated novel method providing  better accuracy than the Friedewald equation in the  estimation of LDL-C.  Gladis APPLETHWAITE et al. SANDREA. 7986;689(80): 2061-2068  (http://education.[facility].com/faq/FAQ164)    Direct LDL  Date Value Ref Range Status  03/02/2022 105 (H) <100 mg/dL Final    Comment:    Greatly elevated Triglycerides values (>1200 mg/dL) interfere with the dLDL assay. As no Triglycerides  testing was ordered, interpret results with caution. . Desirable range <100 mg/dL for primary prevention;   <70 mg/dL for patients with CHD or diabetic patients  with > or = 2 CHD risk factors. SABRA Debby BROCKS Magda, MD [facility] Phone Number: [phone] 08/28/2023 4:05 PM   Total time spent on preparing this encounter including chart review, data review, collecting history, examining the patient, coordinating care for this established patient, 30 minutes.  Portions of this report may have been transcribed using voice recognition software.  Every effort has been made to ensure accuracy; however, inadvertent computerized transcription errors may still be present.

## 2023-08-29 ENCOUNTER — Other Ambulatory Visit: Payer: Self-pay

## 2023-08-29 DIAGNOSIS — Z992 Dependence on renal dialysis: Secondary | ICD-10-CM | POA: Diagnosis not present

## 2023-08-29 DIAGNOSIS — N2581 Secondary hyperparathyroidism of renal origin: Secondary | ICD-10-CM | POA: Diagnosis not present

## 2023-08-29 DIAGNOSIS — N186 End stage renal disease: Secondary | ICD-10-CM | POA: Diagnosis not present

## 2023-08-29 DIAGNOSIS — D631 Anemia in chronic kidney disease: Secondary | ICD-10-CM | POA: Diagnosis not present

## 2023-08-29 DIAGNOSIS — E119 Type 2 diabetes mellitus without complications: Secondary | ICD-10-CM | POA: Diagnosis not present

## 2023-08-29 DIAGNOSIS — E1129 Type 2 diabetes mellitus with other diabetic kidney complication: Secondary | ICD-10-CM | POA: Diagnosis not present

## 2023-08-31 DIAGNOSIS — N186 End stage renal disease: Secondary | ICD-10-CM | POA: Diagnosis not present

## 2023-08-31 DIAGNOSIS — E119 Type 2 diabetes mellitus without complications: Secondary | ICD-10-CM | POA: Diagnosis not present

## 2023-08-31 DIAGNOSIS — N2581 Secondary hyperparathyroidism of renal origin: Secondary | ICD-10-CM | POA: Diagnosis not present

## 2023-08-31 DIAGNOSIS — D631 Anemia in chronic kidney disease: Secondary | ICD-10-CM | POA: Diagnosis not present

## 2023-08-31 DIAGNOSIS — E1129 Type 2 diabetes mellitus with other diabetic kidney complication: Secondary | ICD-10-CM | POA: Diagnosis not present

## 2023-08-31 DIAGNOSIS — Z992 Dependence on renal dialysis: Secondary | ICD-10-CM | POA: Diagnosis not present

## 2023-09-03 DIAGNOSIS — N2581 Secondary hyperparathyroidism of renal origin: Secondary | ICD-10-CM | POA: Diagnosis not present

## 2023-09-03 DIAGNOSIS — Z992 Dependence on renal dialysis: Secondary | ICD-10-CM | POA: Diagnosis not present

## 2023-09-03 DIAGNOSIS — E119 Type 2 diabetes mellitus without complications: Secondary | ICD-10-CM | POA: Diagnosis not present

## 2023-09-03 DIAGNOSIS — N186 End stage renal disease: Secondary | ICD-10-CM | POA: Diagnosis not present

## 2023-09-03 DIAGNOSIS — D631 Anemia in chronic kidney disease: Secondary | ICD-10-CM | POA: Diagnosis not present

## 2023-09-03 DIAGNOSIS — E1129 Type 2 diabetes mellitus with other diabetic kidney complication: Secondary | ICD-10-CM | POA: Diagnosis not present

## 2023-09-04 DIAGNOSIS — Z853 Personal history of malignant neoplasm of breast: Secondary | ICD-10-CM | POA: Diagnosis not present

## 2023-09-04 DIAGNOSIS — Z779 Other contact with and (suspected) exposures hazardous to health: Secondary | ICD-10-CM | POA: Diagnosis not present

## 2023-09-04 DIAGNOSIS — Z760 Encounter for issue of repeat prescription: Secondary | ICD-10-CM | POA: Diagnosis not present

## 2023-09-04 DIAGNOSIS — N760 Acute vaginitis: Secondary | ICD-10-CM | POA: Diagnosis not present

## 2023-09-04 DIAGNOSIS — N907 Vulvar cyst: Secondary | ICD-10-CM | POA: Diagnosis not present

## 2023-09-05 DIAGNOSIS — N2581 Secondary hyperparathyroidism of renal origin: Secondary | ICD-10-CM | POA: Diagnosis not present

## 2023-09-05 DIAGNOSIS — N186 End stage renal disease: Secondary | ICD-10-CM | POA: Diagnosis not present

## 2023-09-05 DIAGNOSIS — E119 Type 2 diabetes mellitus without complications: Secondary | ICD-10-CM | POA: Diagnosis not present

## 2023-09-05 DIAGNOSIS — E1129 Type 2 diabetes mellitus with other diabetic kidney complication: Secondary | ICD-10-CM | POA: Diagnosis not present

## 2023-09-05 DIAGNOSIS — D631 Anemia in chronic kidney disease: Secondary | ICD-10-CM | POA: Diagnosis not present

## 2023-09-05 DIAGNOSIS — Z992 Dependence on renal dialysis: Secondary | ICD-10-CM | POA: Diagnosis not present

## 2023-09-05 NOTE — Progress Notes (Signed)
[facility] [address] [place] [address]    Clinic Day:  09/12/23   Referring physician: [name], MD  Patient Care Team: Donita Brooks, MD as PCP - General (Family Medicine) [name] [name], MD as PCP - Cardiology (Cardiology) [name], MD (Gastroenterology) [name], MD as Consulting Physician (Cardiology) [name], MD as Medical Oncologist (Medical Oncology) [name], MD as Consulting Physician (Nephrology)   ASSESSMENT & PLAN:   Assessment: 1.  Stage Ib (T1CN0) right breast cancer, ER positive, PR and HER-2 negative: -Right lumpectomy and sentinel lymph node biopsy on 09/18/2018 shows 1.5 cm IDC, grade 3, associated with high-grade DCIS, negative margins, 0/3 lymph nodes positive, ER 50%, PR negative and HER-2 negative, Ki-67 60%. -Oncotype DX recurrence score 47.  Distant recurrence at 9 years with tamoxifen alone is 36%.  Absolute chemotherapy benefit is more than 15%. -Adjuvant chemotherapy with 4 cycles of AC from 10/30/2018 through 01/01/2019. -She was started on anastrozole in June 2020. -Mammogram on 09/02/2019 showed calcifications in the posterior aspect of upper outer quadrant of the right breast. -Right partial mastectomy on 02/18/2020 shows high-grade DCIS, 1.5 cm, no invasive carcinoma, resection margins negative.  0/11 lymph nodes.  ER 30% positive, PR negative. -Left simple mastectomy on 06/15/2020 was benign.  Right breast biopsy shows microscopic focus of invasive ductal carcinoma in the background of extensive high-grade DCIS. -Right breast lumpectomy on 07/13/2020 shows multifocal invasive ductal carcinoma, grade 3, largest measuring 1.2 cm.  Extensive high-grade DCIS with necrosis.  Margins are free.  PT1CNX.  There are multiple foci of invasive carcinoma arising from extensive DCIS.  Several small foci of invasive tumor arising from DCIS indicating new primaries.  ER/PR/HER-2 is negative.   Ki-67 is 20%. -PET scan on 09/14/2020 shows areas of nodularity with discrete nodule in the fat of the inferior chest wall within the subcutaneous tissues. -Ultrasound of the right chest wall shows masslike abnormality in the upper inner aspect of the right anterior chest at 1 o'clock position measuring 4.1 cm in greatest dimension.  1.7 cm hypoechoic mass in the central anterior right chest concerning for malignancy.  Possible borderline enlarged residual right axillary lymph node. -Right breast biopsy on 10/12/2020 shows 1.3 cm invasive ductal carcinoma, focally 0.1 cm from superior margin.  DCIS is less than 0.1 cm from superior margin.  PT1CPNX. - Right mastectomy followed by radiation therapy completed in May 2022. - PET scan (11/30/2022): Recurrent/metastatic disease within the right medial pectoralis musculature. - Biopsy (12/19/2022): Metastatic breast adenocarcinoma.  ER positive, 40%, weak staining.  PR negative.  Ki-67 30%.  HER2 IHC 0. - Right chest wall mass resection on 01/18/2023.  Pathology shows 1.7 cm IDC, grade 3.  DCIS with high-grade with necrosis.  Carcinoma involves inked inferior margin with focally invading into atrophic skeletal muscle. - Her case was discussed at multidisciplinary clinic at [facility] on 04/18/2023. - Surgical oncology felt that resection margins are adequate and recommended radiation therapy.  She was also evaluated by medical oncology (Dr. Iona Hansen) who has recommended systemic therapy with 6 cycles of docetaxel, carboplatin and pembrolizumab. - XRT from 05/07/2023 through 05/25/2023. - Cycle 1 carboplatin, docetaxel and pembrolizumab on 06/14/2023  2.  Osteopenia: -Bone density on 01/20/2019 shows T score -1.9. - DEXA scan (03/24/2021) T score -2.4  3.  ESRD on HD: - She is on HD on Monday/Wednesday/Friday under the direction of Dr. Arrie Aran.    Plan: 1.  Recurrent  right breast cancer, ER 40%, weak staining, PR negative, HER2 negative: - She has tolerated  cycle 4 reasonably well. - She shows me a picture of what appears to be mucus string, blood-tinged which appeared after having a bowel movement.  She is seeing a GI on 09/18/2023. - She also has left hand fingertips numbness and she has a left forearm fistula.  She is seen by vascular and was referred to Dr. Merlyn Lot who will see her on 09/13/2023. - After last cycle she had decrease in taste and appetite.  No nausea or vomiting.  She had diarrhea for 1 day, couple of episodes.  She required 4 tablets of Imodium.  No skin rashes reported. - Reviewed labs today: LFTs are normal.  CBC shows hemoglobin 7.7 and normal white count and platelet count. - She will proceed with cycle 5 today.  Will give 1 unit PRBC.  RTC 3 weeks for follow-up for cycle 6.   2.  Hypertension: - Continue Norvasc 10 mg daily and Bystolic 10 mg daily.  Blood pressure today is 130/68.  She did not have any further syncopal episodes.   3.  Neuropathy: - Neuropathy in the feet has been stable.   4.  Right chest wall pain: - This is well-controlled.  Not requiring oxycodone on regular basis.     No orders of the defined types were placed in this encounter.     [name],acting as a Neurosurgeon for [name], MD.,have documented all relevant documentation on the behalf of [name], MD,as directed by  [name], MD while in the presence of [name], MD.   I, [name] MD, have reviewed the above documentation for accuracy and completeness, and I agree with the above.      [name], MD   1/29/20255:29 PM  CHIEF COMPLAINT:   Diagnosis: recurrent right breast cancer    Cancer Staging  Malignant neoplasm of upper-inner quadrant of right female breast [facility]) Staging form: Breast, AJCC 8th Edition - Clinical stage from 09/26/2018: Stage IB (cT1c, cN0, cM0, G3, ER+, PR-, HER2-) - Signed by [name], MD on 09/26/2018    Prior Therapy: 1. Right  lumpectomy and sentinel lymph node biopsy on 09/18/2018  2. Adjuvant chemotherapy with 4 cycles of AC from 10/30/2018 through 01/01/2019  3. Right partial mastectomy on 02/18/2020  4. Right breast lumpectomy on 07/13/2020  5. Right mastectomy followed by radiation therapy completed in May 2022 6.  Anastrozole started in June 2020  Current Therapy: Carboplatin, docetaxel, pembrolizumab   HISTORY OF PRESENT ILLNESS:   Oncology History  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)  09/18/2018 Initial Diagnosis   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)   09/26/2018 Cancer Staging   Staging form: Breast, AJCC 8th Edition - Clinical stage from 09/26/2018: Stage IB (cT1c, cN0, cM0, G3, ER+, PR-, HER2-) - Signed by [name], MD on 09/26/2018   10/30/2018 - 01/01/2019 Chemotherapy   Patient is on Treatment Plan : BREAST Adjuvant AC q21d     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 female presenting to clinic today for follow up of recurrent right breast  cancer. She was last seen by me on 08/16/23.  Today, she states that she is doing well overall. Her appetite level is at 75%. Her energy level is at 75%. She is accompanied by her husband.   She reports she had an episode of BRBPR this morning. She notes she had one episode of a string of mucus that was yellowed colored and slightly blood tinged, likely from hemorrhoids, excreted after a BM. She has an appointment on 09/18/23 with GI.  She reports decreased taste and appetite, as well as abdominal pain after her last treatment. She denies any vomiting, skin rashes, dry cough, or SOB. She had 2 episodes of diarrhea in one day,  which resolved when she took 4 Imodium pills.    She notes chest wall pain is stable and she has not had to take any pain medication for it.   She is being sent to a specialist for pins and needles sensation in the left fingertips only. Neuropathy in the feet are stable.   PAST MEDICAL HISTORY:   Past Medical History: Past Medical History:  Diagnosis Date   (HFpEF) heart failure with preserved ejection fraction (HCC)    a. 01/2019 Echo: EF 55-60%, mild conc LVH. DD.  Torn MV chordae.   Anemia    Atypical chest pain    a. 08/2018 MV: EF 59%, no ischemia; b. 02/2019 Cath: nonobs dzs.   Blood transfusion without reported diagnosis    Breast cancer (HCC) 10/12/2020   Cataract    ESRD (end stage renal disease) on dialysis [facility])    a. HD T, T, S   Essential hypertension, benign    GERD (gastroesophageal reflux disease)    Headache    Hemorrhoids    Mixed hyperlipidemia    Morbid obesity (HCC)    Non-obstructive CAD (coronary artery disease)    a. 02/2019 CathL LM nl, LAD 21m, LCX nl, RCA 25p, EF 55-65%.   PONV (postoperative nausea and vomiting)    Renal insufficiency    S/P colonoscopy 08/2009   Dr. Elnoria Howard: sessile polyp (benign lymphoid), large hemorrhoids, repeat 5-10 years   Temporal arteritis (HCC)    Type 2 diabetes mellitus (HCC)    Wears glasses     Surgical History: Past Surgical History:  Procedure Laterality Date   ABDOMINAL HYSTERECTOMY     APPENDECTOMY     ARTERY BIOPSY N/A 05/09/2018   Procedure: RIGHT TEMPORAL ARTERY BIOPSY;  Surgeon: Jimmye Norman, MD;  Location: [facility] OR;  Service: General;  Laterality: N/A;   BREAST BIOPSY Right 06/15/2020   Procedure: RIGHT BREAST BIOPSY;  Surgeon: Franky Macho, MD;  Location: [facility] ORS;  Service: General;  Laterality: Right;   CATARACT EXTRACTION W/PHACO Left 02/09/2017   Procedure: CATARACT EXTRACTION PHACO AND INTRAOCULAR LENS PLACEMENT LEFT EYE;  Surgeon: Gemma Payor, MD;  Location: [facility] ORS;  Service: Ophthalmology;  Laterality:  Left;  CDE: 4.89   CATARACT EXTRACTION W/PHACO Right 06/04/2017   Procedure: CATARACT EXTRACTION PHACO AND INTRAOCULAR LENS PLACEMENT (IOC);  Surgeon: Gemma Payor, MD;  Location: [facility] ORS;  Service: Ophthalmology;  Laterality: Right;  CDE: 4.12   CHOLECYSTECTOMY  09/29/2011   Procedure: LAPAROSCOPIC CHOLECYSTECTOMY;  Surgeon: Dalia Heading, MD;  Location: [facility] ORS;  Service: General;  Laterality: N/A;   COLONOSCOPY  08/2009   Dr. Elnoria Howard: sessile polyp (benign lymphoid), large hemorrhoids, repeat 5-10 years   COLONOSCOPY N/A 06/12/2016   prominent hemorrhoids   COLONOSCOPY WITH PROPOFOL N/A 06/16/2021   Procedure: COLONOSCOPY WITH PROPOFOL;  Surgeon: Corbin Ade, MD;  Location: [facility];  Service: Endoscopy;  Laterality: N/A;  9:30am (dialysis pt)   ESOPHAGOGASTRODUODENOSCOPY  09/05/2011   WGN:FAOZH hiatal hernia; remainder of exam normal. No explanation for patient's abdominal pain with today's examination   ESOPHAGOGASTRODUODENOSCOPY N/A 12/17/2013   Dr. Jena Gauss: gastric erythema, erosion, mild chronic inflammation on path    ESOPHAGOGASTRODUODENOSCOPY (EGD) WITH PROPOFOL N/A 06/16/2021   Procedure: ESOPHAGOGASTRODUODENOSCOPY (EGD) WITH PROPOFOL;  Surgeon: Corbin Ade, MD;  Location: [facility];  Service: Endoscopy;  Laterality: N/A;   EXCISION OF BREAST BIOPSY Right 10/12/2020   Procedure: EXCISION OF RIGHT BREAST BIOPSY;  Surgeon: Franky Macho, MD;  Location: [facility];  Service: General;  Laterality: Right;   IR DIALY SHUNT INTRO NEEDLE/INTRACATH INITIAL W/IMG LEFT Left 07/17/2023   LAPAROSCOPIC APPENDECTOMY  09/29/2011   Procedure: APPENDECTOMY LAPAROSCOPIC;  Surgeon: Dalia Heading, MD;  Location: [facility];  Service: General;;  incidental appendectomy   LEFT HEART CATH AND CORONARY ANGIOGRAPHY N/A 02/28/2019   Procedure: LEFT HEART CATH AND CORONARY ANGIOGRAPHY;  Surgeon: Corky Crafts, MD;  Location: [facility] INVASIVE CV LAB;  Service: Cardiovascular;  Laterality: N/A;   MASS  EXCISION Right 01/18/2023   Procedure: EXCISION MASS, RIGHT CHEST S/P MASTECTOMY;  Surgeon: Franky Macho, MD;  Location: [facility];  Service: General;  Laterality: Right;   MASTECTOMY MODIFIED RADICAL Right 02/18/2020   Procedure: MASTECTOMY MODIFIED RADICAL;  Surgeon: Franky Macho, MD;  Location: [facility];  Service: General;  Laterality: Right;   MASTECTOMY, PARTIAL Right 07/13/2020   Procedure: RIGHT PARTIAL MASTECTOMY;  Surgeon: Franky Macho, MD;  Location: [facility];  Service: General;  Laterality: Right;   PARTIAL MASTECTOMY WITH NEEDLE LOCALIZATION AND AXILLARY SENTINEL LYMPH NODE BX Right 09/18/2018   Procedure: RIGHT PARTIAL MASTECTOMY AFTER NEEDLE LOCALIZATION, SENTINEL LYMPH NODE BIOPSY RIGHT AXILLA;  Surgeon: Franky Macho, MD;  Location: [facility];  Service: General;  Laterality: Right;   POLYPECTOMY  06/16/2021   Procedure: POLYPECTOMY;  Surgeon: Corbin Ade, MD;  Location: [facility];  Service: Endoscopy;;   PORTACATH PLACEMENT Right 06/07/2023   Procedure: INSERTION PORT-A-CATH, RIGHT  (DIALYSIS ACCESS ON LEFT);  Surgeon: Franky Macho, MD;  Location: [facility];  Service: General;  Laterality: Right;   SIMPLE MASTECTOMY WITH AXILLARY SENTINEL NODE BIOPSY Left 06/15/2020   Procedure: LEFT SIMPLE MASTECTOMY;  Surgeon: Franky Macho, MD;  Location: [facility];  Service: General;  Laterality: Left;    Social History: Social History   Socioeconomic History   Marital status: Married    Spouse name: Not on file   Number of children: Not on file   Years of education: Not on file   Highest education level: 12th grade  Occupational History   Occupation: Systems developer: [employer]  Tobacco Use   Smoking status: Never    Passive exposure: Never   Smokeless tobacco: Never  Vaping Use   Vaping status: Never Used  Substance and Sexual Activity   Alcohol use: No   Drug use: No   Sexual activity: Yes    Birth control/protection: Surgical  Other Topics Concern   Not on file   Social History Narrative   Works at [employer] in [place].    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   Also has to get items from high shelves-causes achy pain in shoulder area      Married.   Children are grown, out of house.  Social Drivers of Corporate investment banker Strain: Low Risk  (06/08/2023)   Overall Financial Resource Strain (CARDIA)    Difficulty of Paying Living Expenses: Not hard at all  Food Insecurity: No Food Insecurity (06/08/2023)   Hunger Vital Sign    Worried About Running Out of Food in the Last Year: Never true    Ran Out of Food in the Last Year: Never true  Transportation Needs: No Transportation Needs (06/08/2023)   PRAPARE - Administrator, Civil Service (Medical): No    Lack of Transportation (Non-Medical): No  Physical Activity: Inactive (06/08/2023)   Exercise Vital Sign    Days of Exercise per Week: 0 days    Minutes of Exercise per Session: 0 min  Stress: No Stress Concern Present (06/08/2023)   [employer] of Occupational Health - Occupational Stress Questionnaire    Feeling of Stress : Not at all  Social Connections: Socially Integrated (06/08/2023)   Social Connection and Isolation Panel [NHANES]    Frequency of Communication with Friends and Family: More than three times a week    Frequency of Social Gatherings with Friends and Family: Once a week    Attends Religious Services: More than 4 times per year    Active Member of [employer] or Organizations: No    Attends Engineer, structural: More than 4 times per year    Marital Status: Married  Catering manager Violence: Not At Risk (09/14/2022)   Humiliation, Afraid, Rape, and Kick questionnaire    Fear of Current or Ex-Partner: No    Emotionally Abused: No    Physically Abused: No    Sexually Abused: No    Family History: Family History  Problem Relation Age of Onset   Hypertension Mother    Coronary artery disease Mother    Diabetes Mother     Hypertension Sister    Coronary artery disease Sister    Hypertension Brother    Heart attack Father    Hypertension Son    Heart attack Maternal Aunt    Hypertension Maternal Aunt    Diabetes Maternal Aunt    Heart attack Maternal Uncle    Hypertension Maternal Uncle    Diabetes Maternal Uncle    Heart attack Paternal Aunt    Hypertension Paternal Aunt    Diabetes Paternal Aunt    Heart attack Paternal Uncle    Hypertension Paternal Uncle    Diabetes Paternal Uncle    Heart attack Maternal Grandmother    Heart attack Maternal Grandfather    Heart attack Paternal Grandmother    Heart attack Paternal Grandfather    Colon cancer Neg Hx     Current Medications:  Current Outpatient Medications:    acetaminophen (TYLENOL) 500 MG tablet, Take 500-1,000 mg by mouth every 6 (six) hours as needed (pain.)., Disp: , Rfl:    amLODipine (NORVASC) 10 MG tablet, TAKE 1 TABLET BY MOUTH AT BEDTIME, Disp: 90 tablet, Rfl: 3   anastrozole (ARIMIDEX) 1 MG tablet, TAKE 1 TABLET BY MOUTH DAILY, Disp: 90 tablet, Rfl: 3   aspirin EC 81 MG tablet, Take 81 mg by mouth in the morning., Disp: , Rfl:    atorvastatin (LIPITOR) 20 MG tablet, TAKE 1 TABLET(20 MG) BY MOUTH DAILY, Disp: 90 tablet, Rfl: 0   B Complex-C-Zn-Folic Acid (DIALYVITE 800-ZINC 15) 0.8 MG TABS, Take 1 tablet by mouth in the morning., Disp: , Rfl:    cephALEXin (KEFLEX) 500 MG capsule, Take 1 capsule every 12  hours by oral route for 7 days., Disp: , Rfl:    cinacalcet (SENSIPAR) 30 MG tablet, Take 2 tablets (60 mg total) by mouth daily. (Patient taking differently: Take 90 mg by mouth daily with breakfast. With a meal), Disp: 90 tablet, Rfl: 0   Darbepoetin Alfa (ARANESP, ALBUMIN FREE, IJ), Darbepoetin Alfa (Aranesp), Disp: , Rfl:    fluticasone (FLONASE) 50 MCG/ACT nasal spray, Place 2 sprays into both nostrils daily., Disp: 16 g, Rfl: 6   gabapentin (NEURONTIN) 300 MG capsule, TAKE 1 CAPSULE(300 MG) BY MOUTH AT BEDTIME, Disp: 90 capsule,  Rfl: 3   iron sucrose (VENOFER) 20 MG/ML injection, Inject 50 mg into the vein once a week., Disp: , Rfl:    lanthanum (FOSRENOL) 1000 MG chewable tablet, Chew 2,000-3,000 mg by mouth See admin instructions. Take 3 tablets (3000 mg) by mouth with meals and take 2 tablets (2000 mg) with snacks, Disp: , Rfl:    lidocaine-prilocaine (EMLA) cream, Apply 1 application  topically every Monday, Wednesday, and Friday with hemodialysis., Disp: , Rfl:    meclizine (ANTIVERT) 25 MG tablet, Take 1 tablet (25 mg total) by mouth 3 (three) times daily as needed for dizziness., Disp: 30 tablet, Rfl: 1   nebivolol (BYSTOLIC) 10 MG tablet, TAKE 1 TABLET BY MOUTH IN THE MORNING AND AT BEDTIME, Disp: 180 tablet, Rfl: 0   nitroGLYCERIN (NITROSTAT) 0.4 MG SL tablet, Place 1 tablet (0.4 mg total) under the tongue every 5 (five) minutes x 3 doses as needed for chest pain (if no relief after 3rd dose, proceed to ED or call 911)., Disp: 25 tablet, Rfl: 3   ondansetron (ZOFRAN) 4 MG tablet, Take 2 tablets (8 mg total) by mouth every 8 (eight) hours as needed for nausea or vomiting., Disp: 90 tablet, Rfl: 0   oxyCODONE (ROXICODONE) 5 MG immediate release tablet, Take 1 tablet (5 mg total) by mouth every 6 (six) hours as needed., Disp: 30 tablet, Rfl: 0   pantoprazole (PROTONIX) 40 MG tablet, TAKE 1 TABLET(40 MG) BY MOUTH DAILY, Disp: 30 tablet, Rfl: 1   prochlorperazine (COMPAZINE) 10 MG tablet, Take 1 tablet (10 mg total) by mouth every 6 (six) hours as needed for nausea or vomiting., Disp: 60 tablet, Rfl: 3   VITAMIN D PO, Take 0.25 mcg by mouth daily., Disp: , Rfl:    Allergies: Allergies  Allergen Reactions   Motrin [Ibuprofen] Other (See Comments)    esrd    REVIEW OF SYSTEMS:   Review of Systems  Constitutional:  Negative for chills, fatigue and fever.  HENT:   Negative for lump/mass, mouth sores, nosebleeds, sore throat and trouble swallowing.   Eyes:  Negative for eye problems.  Respiratory:  Negative for  cough and shortness of breath.   Cardiovascular:  Negative for chest pain, leg swelling and palpitations.  Gastrointestinal:  Positive for blood in stool, diarrhea and nausea. Negative for abdominal pain, constipation and vomiting.  Genitourinary:  Negative for bladder incontinence, difficulty urinating, dysuria, frequency, hematuria and nocturia.   Musculoskeletal:  Negative for arthralgias, back pain, flank pain, myalgias and neck pain.       +pain at port site, 5/10 severity  Skin:  Negative for itching and rash.  Neurological:  Positive for dizziness and numbness (in fingers). Negative for headaches.  Hematological:  Does not bruise/bleed easily.  Psychiatric/Behavioral:  Negative for depression, sleep disturbance and suicidal ideas. The patient is not nervous/anxious.   All other systems reviewed and are negative.    VITALS:  There were no vitals taken for this visit.  Wt Readings from Last 3 Encounters:  09/06/23 177 lb 14.4 oz (80.7 kg)  08/28/23 177 lb (80.3 kg)  08/16/23 179 lb (81.2 kg)    There is no height or weight on file to calculate BMI.  Performance status (ECOG): 1 - Symptomatic but completely ambulatory  PHYSICAL EXAM:   Physical Exam Vitals and nursing note reviewed. Exam conducted with a chaperone present.  Constitutional:      Appearance: Normal appearance.  Cardiovascular:     Rate and Rhythm: Normal rate and regular rhythm.     Pulses: Normal pulses.     Heart sounds: Normal heart sounds.  Pulmonary:     Effort: Pulmonary effort is normal.     Breath sounds: Normal breath sounds.  Abdominal:     Palpations: Abdomen is soft. There is no hepatomegaly, splenomegaly or mass.     Tenderness: There is no abdominal tenderness.  Musculoskeletal:     Right lower leg: No edema.     Left lower leg: No edema.  Lymphadenopathy:     Cervical: No cervical adenopathy.     Right cervical: No superficial, deep or posterior cervical adenopathy.    Left cervical:  No superficial, deep or posterior cervical adenopathy.     Upper Body:     Right upper body: No supraclavicular or axillary adenopathy.     Left upper body: No supraclavicular or axillary adenopathy.  Neurological:     General: No focal deficit present.     Mental Status: She is alert and oriented to person, place, and time.  Psychiatric:        Mood and Affect: Mood normal.        Behavior: Behavior normal.     LABS:      Latest Ref Rng & Units 09/06/2023    8:07 AM 08/16/2023    8:08 AM 07/26/2023    8:07 AM  CBC  WBC 4.0 - 10.5 K/uL 6.6  7.0  5.7   Hemoglobin 12.0 - 15.0 g/dL 7.7  8.3  7.9   Hematocrit 36.0 - 46.0 % 24.3  25.8  25.2   Platelets 150 - 400 K/uL 179  176  253       Latest Ref Rng & Units 09/06/2023    8:07 AM 08/16/2023    8:08 AM 07/26/2023    8:07 AM  CMP  Glucose 70 - 99 mg/dL 161  096  045   BUN 8 - 23 mg/dL 24  51  27   Creatinine 0.44 - 1.00 mg/dL 4.09  81.19  1.47   Sodium 135 - 145 mmol/L 138  137  139   Potassium 3.5 - 5.1 mmol/L 3.3  3.4  4.4   Chloride 98 - 111 mmol/L 97  98  97   CO2 22 - 32 mmol/L 27  26  28    Calcium 8.9 - 10.3 mg/dL 8.6  8.5  9.0   Total Protein 6.5 - 8.1 g/dL 6.8  6.8  7.0   Total Bilirubin 0.0 - 1.2 mg/dL 0.6  0.7  0.4   Alkaline Phos 38 - 126 U/L 149  161  150   AST 15 - 41 U/L 21  18  19    ALT 0 - 44 U/L 17  21  17       No results found for: "CEA1", "CEA" / No results found for: "CEA1", "CEA" No results found for: "PSA1" No results found for: "WGN562"  No results found for: "CAN125"  No results found for: "TOTALPROTELP", "ALBUMINELP", "A1GS", "A2GS", "BETS", "BETA2SER", "GAMS", "MSPIKE", "SPEI" Lab Results  Component Value Date   TIBC 195 (L) 02/15/2021   TIBC 190 (L) 12/10/2018   TIBC 218 (L) 06/08/2016   FERRITIN 1,742 (H) 02/15/2021   FERRITIN 1,308 (H) 12/10/2018   FERRITIN 126 06/08/2016   IRONPCTSAT 15 02/15/2021   IRONPCTSAT 43 (H) 12/10/2018   IRONPCTSAT 22 06/08/2016   No results found for:  "LDH"   STUDIES:   VAS [place] STEAL EXAM Result Date: 08/28/2023 DIALYSIS STEAL  Patient Name:  TRIA NOGUERA  Date of Exam:   08/28/2023 Medical Rec #: [id number]            Accession #:    [id number] Date of Birth: 06-Aug-1961            Patient Gender: F Patient Age:   65 years Exam Location:  [facility] Procedure:      VAS [place] STEAL EXAM Referring Phys: Heath Lark --------------------------------------------------------------------------------  Reason for Exam: Numbness and Tingling. Access Site: Left Upper Extremity. Access Type: Radial-cephalic AVF. History: Fistula created Aug 2018 at outside facility          04/18/22: Arteriovenous fistula- Velocities less than 100cm/ s noted.          Patent Radial cephalic AVF.          Radial artery distal to anastamosis is retrograde and normalizes with          compression of the AVF. This is suggstive of an arterial steal.          Multiple branches of the outflow cephalic vein. Performing Technologist: Thereasa Parkin RVT  Examination Guidelines: A complete evaluation includes B-mode imaging, spectral Doppler, color Doppler, and power Doppler as needed of all accessible portions of each vessel. Bilateral testing is considered an integral part of a complete examination. Limited examinations for reoccurring indications may be performed as noted.  Findings: +--------------------+-------------+----------+---------+----------+-----------+ Radiocephalic       Diameter (cm)Depth (cm)BranchingPSV (cm/s)Flow Volume Arteriovenous                                                  (ml/min)   Fistula                                                                   +--------------------+-------------+----------+---------+----------+-----------+ Inflow                                                 255                +--------------------+-------------+----------+---------+----------+-----------+ AVF anastomosis                                         652                +--------------------+-------------+----------+---------+----------+-----------+  outflow vein distal      2.7                                              forearm                                                                   +--------------------+-------------+----------+---------+----------+-----------+ +---------------------------+-----+--------+----------+ Steal findings             RightLeft    Comments   +---------------------------+-----+--------+----------+ Brachial                                           +---------------------------+-----+--------+----------+ Radial Ambient                  195 mmHgretrograde +---------------------------+-----+--------+----------+ Radial AV Compression           48 mmHg antegrade  +---------------------------+-----+--------+----------+ Ulnar Ambient                                      +---------------------------+-----+--------+----------+ Ulnar AV Compression                               +---------------------------+-----+--------+----------+ 2nd Digit Ambient                                  +---------------------------+-----+--------+----------+ 2nd Digit Ulnar Compression                        +---------------------------+-----+--------+----------+  Summary: Radial artery distal to the anastomosis is retrograde and returns to antegrade flow with AVF compression. *See table(s) above for measurements and observations. Diagnosing physician: Heath Lark Electronically signed by Heath Lark on 08/28/2023 at 4:31:04 PM.    Final

## 2023-09-06 ENCOUNTER — Inpatient Hospital Stay: Payer: Medicare Other

## 2023-09-06 ENCOUNTER — Inpatient Hospital Stay (HOSPITAL_BASED_OUTPATIENT_CLINIC_OR_DEPARTMENT_OTHER): Payer: Medicare Other | Admitting: Hematology

## 2023-09-06 VITALS — BP 137/76 | HR 84 | Temp 96.6°F | Resp 18 | Wt 177.9 lb

## 2023-09-06 DIAGNOSIS — C50911 Malignant neoplasm of unspecified site of right female breast: Secondary | ICD-10-CM

## 2023-09-06 DIAGNOSIS — C50211 Malignant neoplasm of upper-inner quadrant of right female breast: Secondary | ICD-10-CM | POA: Diagnosis not present

## 2023-09-06 DIAGNOSIS — C799 Secondary malignant neoplasm of unspecified site: Secondary | ICD-10-CM

## 2023-09-06 DIAGNOSIS — R197 Diarrhea, unspecified: Secondary | ICD-10-CM | POA: Diagnosis not present

## 2023-09-06 DIAGNOSIS — Z170 Estrogen receptor positive status [ER+]: Secondary | ICD-10-CM

## 2023-09-06 DIAGNOSIS — Z95828 Presence of other vascular implants and grafts: Secondary | ICD-10-CM

## 2023-09-06 DIAGNOSIS — M8580 Other specified disorders of bone density and structure, unspecified site: Secondary | ICD-10-CM | POA: Diagnosis not present

## 2023-09-06 DIAGNOSIS — Z5111 Encounter for antineoplastic chemotherapy: Secondary | ICD-10-CM | POA: Diagnosis not present

## 2023-09-06 DIAGNOSIS — Z79811 Long term (current) use of aromatase inhibitors: Secondary | ICD-10-CM | POA: Diagnosis not present

## 2023-09-06 LAB — COMPREHENSIVE METABOLIC PANEL
ALT: 17 U/L (ref 0–44)
AST: 21 U/L (ref 15–41)
Albumin: 3.4 g/dL — ABNORMAL LOW (ref 3.5–5.0)
Alkaline Phosphatase: 149 U/L — ABNORMAL HIGH (ref 38–126)
Anion gap: 14 (ref 5–15)
BUN: 24 mg/dL — ABNORMAL HIGH (ref 8–23)
CO2: 27 mmol/L (ref 22–32)
Calcium: 8.6 mg/dL — ABNORMAL LOW (ref 8.9–10.3)
Chloride: 97 mmol/L — ABNORMAL LOW (ref 98–111)
Creatinine, Ser: 8.4 mg/dL — ABNORMAL HIGH (ref 0.44–1.00)
GFR, Estimated: 5 mL/min — ABNORMAL LOW (ref >60)
Glucose, Bld: 159 mg/dL — ABNORMAL HIGH (ref 70–99)
Potassium: 3.3 mmol/L — ABNORMAL LOW (ref 3.5–5.1)
Sodium: 138 mmol/L (ref 135–145)
Total Bilirubin: 0.6 mg/dL (ref 0.0–1.2)
Total Protein: 6.8 g/dL (ref 6.5–8.1)

## 2023-09-06 LAB — SAMPLE TO BLOOD BANK

## 2023-09-06 LAB — CBC WITH DIFFERENTIAL/PLATELET
Abs Immature Granulocytes: 0.04 10*3/uL (ref 0.00–0.07)
Basophils Absolute: 0.1 10*3/uL (ref 0.0–0.1)
Basophils Relative: 1 %
Eosinophils Absolute: 0.2 10*3/uL (ref 0.0–0.5)
Eosinophils Relative: 3 %
HCT: 24.3 % — ABNORMAL LOW (ref 36.0–46.0)
Hemoglobin: 7.7 g/dL — ABNORMAL LOW (ref 12.0–15.0)
Immature Granulocytes: 1 %
Lymphocytes Relative: 15 %
Lymphs Abs: 1 10*3/uL (ref 0.7–4.0)
MCH: 35.5 pg — ABNORMAL HIGH (ref 26.0–34.0)
MCHC: 31.7 g/dL (ref 30.0–36.0)
MCV: 112 fL — ABNORMAL HIGH (ref 80.0–100.0)
Monocytes Absolute: 0.7 10*3/uL (ref 0.1–1.0)
Monocytes Relative: 11 %
Neutro Abs: 4.6 10*3/uL (ref 1.7–7.7)
Neutrophils Relative %: 69 %
Platelets: 179 10*3/uL (ref 150–400)
RBC: 2.17 MIL/uL — ABNORMAL LOW (ref 3.87–5.11)
RDW: 19.9 % — ABNORMAL HIGH (ref 11.5–15.5)
Smear Review: ADEQUATE
WBC: 6.6 10*3/uL (ref 4.0–10.5)
nRBC: 0 % (ref 0.0–0.2)

## 2023-09-06 LAB — MAGNESIUM: Magnesium: 1.9 mg/dL (ref 1.7–2.4)

## 2023-09-06 LAB — PREPARE RBC (CROSSMATCH)

## 2023-09-06 MED ORDER — SODIUM CHLORIDE 0.9% FLUSH
10.0000 mL | INTRAVENOUS | Status: DC | PRN
  Administered 2023-09-06: 10 mL

## 2023-09-06 MED ORDER — PALONOSETRON HCL INJECTION 0.25 MG/5ML
0.2500 mg | Freq: Once | INTRAVENOUS | Status: AC
  Administered 2023-09-06: 0.25 mg via INTRAVENOUS
  Filled 2023-09-06: qty 5

## 2023-09-06 MED ORDER — PEMBROLIZUMAB CHEMO INJECTION 100 MG/4ML
200.0000 mg | Freq: Once | INTRAVENOUS | Status: AC
  Administered 2023-09-06: 200 mg via INTRAVENOUS
  Filled 2023-09-06: qty 8

## 2023-09-06 MED ORDER — DEXAMETHASONE SODIUM PHOSPHATE 10 MG/ML IJ SOLN
10.0000 mg | Freq: Once | INTRAMUSCULAR | Status: AC
  Administered 2023-09-06: 10 mg via INTRAVENOUS
  Filled 2023-09-06: qty 1

## 2023-09-06 MED ORDER — SODIUM CHLORIDE 0.9 % IV SOLN
150.0000 mg | Freq: Once | INTRAVENOUS | Status: AC
  Administered 2023-09-06: 150 mg via INTRAVENOUS
  Filled 2023-09-06: qty 5

## 2023-09-06 MED ORDER — DIPHENHYDRAMINE HCL 25 MG PO CAPS
25.0000 mg | ORAL_CAPSULE | Freq: Once | ORAL | Status: AC
  Administered 2023-09-06: 25 mg via ORAL
  Filled 2023-09-06: qty 1

## 2023-09-06 MED ORDER — SODIUM CHLORIDE FLUSH 0.9 % IV SOLN
10.0000 mL | Freq: Once | INTRAVENOUS | Status: AC
Start: 2023-09-06 — End: 2023-09-06
  Administered 2023-09-06: 10 mL via INTRAVENOUS
  Filled 2023-09-06: qty 10

## 2023-09-06 MED ORDER — POTASSIUM CHLORIDE CRYS ER 20 MEQ PO TBCR
40.0000 meq | EXTENDED_RELEASE_TABLET | Freq: Once | ORAL | Status: AC
  Administered 2023-09-06: 40 meq via ORAL
  Filled 2023-09-06: qty 2

## 2023-09-06 MED ORDER — SODIUM CHLORIDE 0.9 % IV SOLN
INTRAVENOUS | Status: DC
Start: 2023-09-06 — End: 2023-09-10

## 2023-09-06 MED ORDER — HEPARIN SOD (PORK) LOCK FLUSH 100 UNIT/ML IV SOLN
500.0000 [IU] | Freq: Once | INTRAVENOUS | Status: AC | PRN
  Administered 2023-09-06: 500 [IU]

## 2023-09-06 MED ORDER — DOCETAXEL CHEMO INJECTION 160 MG/16ML
60.0000 mg/m2 | Freq: Once | INTRAVENOUS | Status: AC
  Administered 2023-09-06: 112 mg via INTRAVENOUS
  Filled 2023-09-06: qty 11.2

## 2023-09-06 MED ORDER — SODIUM CHLORIDE 0.9% IV SOLUTION
250.0000 mL | INTRAVENOUS | Status: DC
  Administered 2023-09-06: 100 mL via INTRAVENOUS

## 2023-09-06 MED ORDER — ACETAMINOPHEN 325 MG PO TABS
650.0000 mg | ORAL_TABLET | Freq: Once | ORAL | Status: AC
  Administered 2023-09-06: 650 mg via ORAL
  Filled 2023-09-06: qty 2

## 2023-09-06 MED ORDER — CARBOPLATIN CHEMO INJECTION 450 MG/45ML
160.0000 mg | Freq: Once | INTRAVENOUS | Status: AC
  Administered 2023-09-06: 160 mg via INTRAVENOUS
  Filled 2023-09-06: qty 16

## 2023-09-06 NOTE — Progress Notes (Signed)
Hemoglobin is 7.7 today. Will give one unit of blood per MD.

## 2023-09-06 NOTE — Progress Notes (Signed)
Patient presents today for f/u with Dr. Ellin Saba and treatment. Vital signs stable. Creatinine 8.40 , HGB 7.7, Potassium 3.3. Message received from Dr. Ellin Saba to proceed with treatment. Labs reviewed by MD. 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   Treatment given today per MD orders. Tolerated infusion without adverse affects. Vital signs stable. No complaints at this time. Discharged from clinic ambulatory in stable condition. Alert and oriented x 3. F/U with [facility] as scheduled.

## 2023-09-06 NOTE — Progress Notes (Signed)
Patient has been examined by Dr. Ellin Saba. Vital signs and labs have been reviewed by MD - ANC, Creatinine, LFTs, hemoglobin (7.7), and platelets are within treatment parameters per M.D. - pt may proceed with treatment. Transfuse 1 unit PRBC per MD. Primary RN and pharmacy notified.

## 2023-09-06 NOTE — Patient Instructions (Addendum)
[facility] at [facility] Discharge Instructions   You were seen and examined today by Dr. Ellin Saba.  He reviewed the results of your lab work which are mostly normal/stable. Your hemoglobin is low today at 7.7. We will give you one unit of blood today.   We will proceed with your treatment today.   Return as scheduled.    Thank you for choosing [facility] at [facility] to provide your oncology and hematology care.  To afford each patient quality time with our provider, please arrive at least 15 minutes before your scheduled appointment time.   If you have a lab appointment with the Cancer Center please come in thru the Main Entrance and check in at the main information desk.  You need to re-schedule your appointment should you arrive 10 or more minutes late.  We strive to give you quality time with our providers, and arriving late affects you and other patients whose appointments are after yours.  Also, if you no show three or more times for appointments you may be dismissed from the clinic at the providers discretion.     Again, thank you for choosing [facility].  Our hope is that these requests will decrease the amount of time that you wait before being seen by our physicians.       _____________________________________________________________  Should you have questions after your visit to [facility], please contact our office at [phone] and follow the prompts.  Our office hours are 8:00 a.m. and 4:30 p.m. Monday - Friday.  Please note that voicemails left after 4:00 p.m. may not be returned until the following business day.  We are closed weekends and major holidays.  You do have access to a nurse 24-7, just call the main number to the clinic [phone] and do not press any options, hold on the line and a nurse will answer the phone.    For prescription refill requests, have your pharmacy contact our  office and allow 72 hours.    Due to Covid, you will need to wear a mask upon entering the hospital. If you do not have a mask, a mask will be given to you at the Main Entrance upon arrival. For doctor visits, patients may have 1 support person age 40 or older with them. For treatment visits, patients can not have anyone with them due to social distancing guidelines and our immunocompromised population.

## 2023-09-06 NOTE — Patient Instructions (Signed)
[facility] [place] - A DEPT OF [facility]  Discharge Instructions: Thank you for choosing [facility] to provide your oncology and hematology care.  If you have a lab appointment with the Cancer Center - please note that after April 8th, 2024, all labs will be drawn in the cancer center.  You do not have to check in or register with the main entrance as you have in the past but will complete your check-in in the cancer center.  Wear comfortable clothing and clothing appropriate for easy access to any Portacath or PICC line.   We strive to give you quality time with your provider. You may need to reschedule your appointment if you arrive late (15 or more minutes).  Arriving late affects you and other patients whose appointments are after yours.  Also, if you miss three or more appointments without notifying the office, you may be dismissed from the clinic at the provider's discretion.      For prescription refill requests, have your pharmacy contact our office and allow 72 hours for refills to be completed.    Today you received the following chemotherapy and/or immunotherapy agents Keytruda, Taxotere, Carboplatin. Carboplatin Injection What is this medication? CARBOPLATIN (KAR boe pla tin) treats some types of cancer. It works by slowing down the growth of cancer cells. This medicine may be used for other purposes; ask your health care provider or pharmacist if you have questions. COMMON BRAND NAME(S): Paraplatin What should I tell my care team before I take this medication? They need to know if you have any of these conditions: Blood disorders Hearing problems Kidney disease Recent or ongoing radiation therapy An unusual or allergic reaction to carboplatin, cisplatin, other medications, foods, dyes, or preservatives Pregnant or trying to get pregnant Breast-feeding How should I use this medication? This medication is injected into a vein. It is  given by your care team in a hospital or clinic setting. Talk to your care team about the use of this medication in children. Special care may be needed. Overdosage: If you think you have taken too much of this medicine contact a poison control center or emergency room at once. NOTE: This medicine is only for you. Do not share this medicine with others. What if I miss a dose? Keep appointments for follow-up doses. It is important not to miss your dose. Call your care team if you are unable to keep an appointment. What may interact with this medication? Medications for seizures Some antibiotics, such as amikacin, gentamicin, neomycin, streptomycin, tobramycin Vaccines This list may not describe all possible interactions. Give your health care provider a list of all the medicines, herbs, non-prescription drugs, or dietary supplements you use. Also tell them if you smoke, drink alcohol, or use illegal drugs. Some items may interact with your medicine. What should I watch for while using this medication? Your condition will be monitored carefully while you are receiving this medication. You may need blood work while taking this medication. This medication may make you feel generally unwell. This is not uncommon, as chemotherapy can affect healthy cells as well as cancer cells. Report any side effects. Continue your course of treatment even though you feel ill unless your care team tells you to stop. In some cases, you may be given additional medications to help with side effects. Follow all directions for their use. This medication may increase your risk of getting an infection. Call your care team for advice  if you get a fever, chills, sore throat, or other symptoms of a cold or flu. Do not treat yourself. Try to avoid being around people who are sick. Avoid taking medications that contain aspirin, acetaminophen, ibuprofen, naproxen, or ketoprofen unless instructed by your care team. These medications  may hide a fever. Be careful brushing or flossing your teeth or using a toothpick because you may get an infection or bleed more easily. If you have any dental work done, tell your dentist you are receiving this medication. Talk to your care team if you wish to become pregnant or think you might be pregnant. This medication can cause serious birth defects. Talk to your care team about effective forms of contraception. Do not breast-feed while taking this medication. What side effects may I notice from receiving this medication? Side effects that you should report to your care team as soon as possible: Allergic reactions--skin rash, itching, hives, swelling of the face, lips, tongue, or throat Infection--fever, chills, cough, sore throat, wounds that don't heal, pain or trouble when passing urine, general feeling of discomfort or being unwell Low red blood cell level--unusual weakness or fatigue, dizziness, headache, trouble breathing Pain, tingling, or numbness in the hands or feet, muscle weakness, change in vision, confusion or trouble speaking, loss of balance or coordination, trouble walking, seizures Unusual bruising or bleeding Side effects that usually do not require medical attention (report to your care team if they continue or are bothersome): Hair loss Nausea Unusual weakness or fatigue Vomiting This list may not describe all possible side effects. Call your doctor for medical advice about side effects. You may report side effects to FDA at 1-800-FDA-1088. Where should I keep my medication? This medication is given in a hospital or clinic. It will not be stored at home. NOTE: This sheet is a summary. It may not cover all possible information. If you have questions about this medicine, talk to your doctor, pharmacist, or health care provider.  2024 Elsevier/Gold Standard (2021-11-22 00:00:00)Docetaxel Injection What is this medication? DOCETAXEL (doe se TAX el) treats some types of  cancer. It works by slowing down the growth of cancer cells. This medicine may be used for other purposes; ask your health care provider or pharmacist if you have questions. COMMON BRAND NAME(S): BEIZRAY, Docefrez, Docivyx, Taxotere What should I tell my care team before I take this medication? They need to know if you have any of these conditions: Kidney disease Liver disease Low white blood cell levels Tingling of the fingers or toes or other nerve disorder An unusual or allergic reaction to docetaxel, polysorbate 80, other medications, foods, dyes, or preservatives Pregnant or trying to get pregnant Breast-feeding How should I use this medication? This medication is injected into a vein. It is given by your care team in a hospital or clinic setting. Talk to your care team about the use of this medication in children. Special care may be needed. Overdosage: If you think you have taken too much of this medicine contact a poison control center or emergency room at once. NOTE: This medicine is only for you. Do not share this medicine with others. What if I miss a dose? Keep appointments for follow-up doses. It is important not to miss your dose. Call your care team if you are unable to keep an appointment. What may interact with this medication? Do not take this medication with any of the following: Live virus vaccines This medication may also interact with the following: Certain antibiotics, such  as clarithromycin, telithromycin Certain antivirals for HIV or hepatitis Certain medications for fungal infections, such as itraconazole, ketoconazole, voriconazole Grapefruit juice Nefazodone Supplements, such as St. John's wort This list may not describe all possible interactions. Give your health care provider a list of all the medicines, herbs, non-prescription drugs, or dietary supplements you use. Also tell them if you smoke, drink alcohol, or use illegal drugs. Some items may interact  with your medicine. What should I watch for while using this medication? This medication may make you feel generally unwell. This is not uncommon as chemotherapy can affect healthy cells as well as cancer cells. Report any side effects. Continue your course of treatment even though you feel ill unless your care team tells you to stop. You may need blood work done while you are taking this medication. This medication can cause serious side effects and infusion reactions. To reduce the risk, your care team may give you other medications to take before receiving this one. Be sure to follow the directions from your care team. This medication may increase your risk of getting an infection. Call your care team for advice if you get a fever, chills, sore throat, or other symptoms of a cold or flu. Do not treat yourself. Try to avoid being around people who are sick. Avoid taking medications that contain aspirin, acetaminophen, ibuprofen, naproxen, or ketoprofen unless instructed by your care team. These medications may hide a fever. Be careful brushing or flossing your teeth or using a toothpick because you may get an infection or bleed more easily. If you have any dental work done, tell your dentist you are receiving this medication. Some products may contain alcohol. Ask your care team if this medication contains alcohol. Be sure to tell all care teams you are taking this medicine. Certain medications, like metronidazole and disulfiram, can cause an unpleasant reaction when taken with alcohol. The reaction includes flushing, headache, nausea, vomiting, sweating, and increased thirst. The reaction can last from 30 minutes to several hours. This medication may affect your coordination, reaction time, or judgement. Do not drive or operate machinery until you know how this medication affects you. Sit up or stand slowly to reduce the risk of dizzy or fainting spells. Drinking alcohol with this medication can increase  the risk of these side effects. Talk to your care team about your risk of cancer. You may be more at risk for certain types of cancer if you take this medication. Talk to your care team if you wish to become pregnant or think you might be pregnant. This medication can cause serious birth defects if taken during pregnancy or if you get pregnant within 2 months after stopping therapy. A negative pregnancy test is required before starting this medication. A reliable form of contraception is recommended while taking this medication and for 2 months after stopping it. Talk to your care team about reliable forms of contraception. Do not breast-feed while taking this medication and for 1 week after stopping therapy. Use a condom during sex and for 4 months after stopping therapy. Tell your care team right away if you think your partner might be pregnant. This medication can cause serious birth defects. This medication may cause infertility. Talk to your care team if you are concerned about your fertility. What side effects may I notice from receiving this medication? Side effects that you should report to your care team as soon as possible: Allergic reactions--skin rash, itching, hives, swelling of the face, lips, tongue, or  throat Change in vision such as blurry vision, seeing halos around lights, vision loss Infection--fever, chills, cough, or sore throat Infusion reactions--chest pain, shortness of breath or trouble breathing, feeling faint or lightheaded Low red blood cell level--unusual weakness or fatigue, dizziness, headache, trouble breathing Pain, tingling, or numbness in the hands or feet Painful swelling, warmth, or redness of the skin, blisters or sores at the infusion site Redness, blistering, peeling, or loosening of the skin, including inside the mouth Sudden or severe stomach pain, bloody diarrhea, fever, nausea, vomiting Swelling of the ankles, hands, or feet Tumor lysis syndrome  (TLS)--nausea, vomiting, diarrhea, decrease in the amount of urine, dark urine, unusual weakness or fatigue, confusion, muscle pain or cramps, fast or irregular heartbeat, joint pain Unusual bruising or bleeding Side effects that usually do not require medical attention (report to your care team if they continue or are bothersome): Change in nail shape, thickness, or color Change in taste Hair loss Increased tears This list may not describe all possible side effects. Call your doctor for medical advice about side effects. You may report side effects to FDA at 1-800-FDA-1088. Where should I keep my medication? This medication is given in a hospital or clinic. It will not be stored at home. NOTE: This sheet is a summary. It may not cover all possible information. If you have questions about this medicine, talk to your doctor, pharmacist, or health care provider.  2024 Elsevier/Gold Standard (2021-10-06 00:00:00)Pembrolizumab Injection What is this medication? PEMBROLIZUMAB (PEM broe LIZ ue mab) treats some types of cancer. It works by helping your immune system slow or stop the spread of cancer cells. It is a monoclonal antibody. This medicine may be used for other purposes; ask your health care provider or pharmacist if you have questions. COMMON BRAND NAME(S): Keytruda What should I tell my care team before I take this medication? They need to know if you have any of these conditions: Allogeneic stem cell transplant (uses someone else's stem cells) Autoimmune diseases, such as Crohn disease, ulcerative colitis, lupus History of chest radiation Nervous system problems, such as Guillain-Barre syndrome, myasthenia gravis Organ transplant An unusual or allergic reaction to pembrolizumab, other medications, foods, dyes, or preservatives Pregnant or trying to get pregnant Breast-feeding How should I use this medication? This medication is injected into a vein. It is given by your care team in a  hospital or clinic setting. A special MedGuide will be given to you before each treatment. Be sure to read this information carefully each time. Talk to your care team about the use of this medication in children. While it may be prescribed for children as young as 6 months for selected conditions, precautions do apply. Overdosage: If you think you have taken too much of this medicine contact a poison control center or emergency room at once. NOTE: This medicine is only for you. Do not share this medicine with others. What if I miss a dose? Keep appointments for follow-up doses. It is important not to miss your dose. Call your care team if you are unable to keep an appointment. What may interact with this medication? Interactions have not been studied. This list may not describe all possible interactions. Give your health care provider a list of all the medicines, herbs, non-prescription drugs, or dietary supplements you use. Also tell them if you smoke, drink alcohol, or use illegal drugs. Some items may interact with your medicine. What should I watch for while using this medication? Your condition will be  monitored carefully while you are receiving this medication. You may need blood work while taking this medication. This medication may cause serious skin reactions. They can happen weeks to months after starting the medication. Contact your care team right away if you notice fevers or flu-like symptoms with a rash. The rash may be red or purple and then turn into blisters or peeling of the skin. You may also notice a red rash with swelling of the face, lips, or lymph nodes in your neck or under your arms. Tell your care team right away if you have any change in your eyesight. Talk to your care team if you may be pregnant. Serious birth defects can occur if you take this medication during pregnancy and for 4 months after the last dose. You will need a negative pregnancy test before starting this  medication. Contraception is recommended while taking this medication and for 4 months after the last dose. Your care team can help you find the option that works for you. Do not breastfeed while taking this medication and for 4 months after the last dose. What side effects may I notice from receiving this medication? Side effects that you should report to your care team as soon as possible: Allergic reactions--skin rash, itching, hives, swelling of the face, lips, tongue, or throat Dry cough, shortness of breath or trouble breathing Eye pain, redness, irritation, or discharge with blurry or decreased vision Heart muscle inflammation--unusual weakness or fatigue, shortness of breath, chest pain, fast or irregular heartbeat, dizziness, swelling of the ankles, feet, or hands Hormone gland problems--headache, sensitivity to light, unusual weakness or fatigue, dizziness, fast or irregular heartbeat, increased sensitivity to cold or heat, excessive sweating, constipation, hair loss, increased thirst or amount of urine, tremors or shaking, irritability Infusion reactions--chest pain, shortness of breath or trouble breathing, feeling faint or lightheaded Kidney injury (glomerulonephritis)--decrease in the amount of urine, red or dark brown urine, foamy or bubbly urine, swelling of the ankles, hands, or feet Liver injury--right upper belly pain, loss of appetite, nausea, light-colored stool, dark yellow or brown urine, yellowing skin or eyes, unusual weakness or fatigue Pain, tingling, or numbness in the hands or feet, muscle weakness, change in vision, confusion or trouble speaking, loss of balance or coordination, trouble walking, seizures Rash, fever, and swollen lymph nodes Redness, blistering, peeling, or loosening of the skin, including inside the mouth Sudden or severe stomach pain, bloody diarrhea, fever, nausea, vomiting Side effects that usually do not require medical attention (report to your care  team if they continue or are bothersome): Bone, joint, or muscle pain Diarrhea Fatigue Loss of appetite Nausea Skin rash This list may not describe all possible side effects. Call your doctor for medical advice about side effects. You may report side effects to FDA at 1-800-FDA-1088. Where should I keep my medication? This medication is given in a hospital or clinic. It will not be stored at home. NOTE: This sheet is a summary. It may not cover all possible information. If you have questions about this medicine, talk to your doctor, pharmacist, or health care provider.  2024 Elsevier/Gold Standard (2021-12-13 00:00:00)      To help prevent nausea and vomiting after your treatment, we encourage you to take your nausea medication as directed.  BELOW ARE SYMPTOMS THAT SHOULD BE REPORTED IMMEDIATELY: *FEVER GREATER THAN 100.4 F (38 C) OR HIGHER *CHILLS OR SWEATING *NAUSEA AND VOMITING THAT IS NOT CONTROLLED WITH YOUR NAUSEA MEDICATION *UNUSUAL SHORTNESS OF BREATH *UNUSUAL BRUISING OR  BLEEDING *URINARY PROBLEMS (pain or burning when urinating, or frequent urination) *BOWEL PROBLEMS (unusual diarrhea, constipation, pain near the anus) TENDERNESS IN MOUTH AND THROAT WITH OR WITHOUT PRESENCE OF ULCERS (sore throat, sores in mouth, or a toothache) UNUSUAL RASH, SWELLING OR PAIN  UNUSUAL VAGINAL DISCHARGE OR ITCHING   Items with * indicate a potential emergency and should be followed up as soon as possible or go to the Emergency Department if any problems should occur.  Please show the CHEMOTHERAPY ALERT CARD or IMMUNOTHERAPY ALERT CARD at check-in to the Emergency Department and triage nurse.  Should you have questions after your visit or need to cancel or reschedule your appointment, please contact [facility] [place] - A DEPT OF Eligha Bridegroom [facility] [phone]  and follow the prompts.  Office hours are 8:00 a.m. to 4:30 p.m. Monday - Friday. Please note that voicemails  left after 4:00 p.m. may not be returned until the following business day.  We are closed weekends and major holidays. You have access to a nurse at all times for urgent questions. Please call the main number to the clinic [phone] and follow the prompts.  For any non-urgent questions, you may also contact your provider using MyChart. We now offer e-Visits for anyone 44 and older to request care online for non-urgent symptoms. For details visit [email or web address].   Also download the MyChart app! Go to the app store, search "MyChart", open the app, select [place], and log in with your MyChart username and password.

## 2023-09-07 ENCOUNTER — Inpatient Hospital Stay: Payer: Medicare Other

## 2023-09-07 VITALS — BP 129/71 | HR 90 | Temp 97.4°F | Resp 18

## 2023-09-07 DIAGNOSIS — R197 Diarrhea, unspecified: Secondary | ICD-10-CM | POA: Diagnosis not present

## 2023-09-07 DIAGNOSIS — C50911 Malignant neoplasm of unspecified site of right female breast: Secondary | ICD-10-CM

## 2023-09-07 DIAGNOSIS — N186 End stage renal disease: Secondary | ICD-10-CM | POA: Diagnosis not present

## 2023-09-07 DIAGNOSIS — Z170 Estrogen receptor positive status [ER+]: Secondary | ICD-10-CM | POA: Diagnosis not present

## 2023-09-07 DIAGNOSIS — E1129 Type 2 diabetes mellitus with other diabetic kidney complication: Secondary | ICD-10-CM | POA: Diagnosis not present

## 2023-09-07 DIAGNOSIS — Z79811 Long term (current) use of aromatase inhibitors: Secondary | ICD-10-CM | POA: Diagnosis not present

## 2023-09-07 DIAGNOSIS — Z992 Dependence on renal dialysis: Secondary | ICD-10-CM | POA: Diagnosis not present

## 2023-09-07 DIAGNOSIS — M8580 Other specified disorders of bone density and structure, unspecified site: Secondary | ICD-10-CM | POA: Diagnosis not present

## 2023-09-07 DIAGNOSIS — C50211 Malignant neoplasm of upper-inner quadrant of right female breast: Secondary | ICD-10-CM | POA: Diagnosis not present

## 2023-09-07 DIAGNOSIS — Z5111 Encounter for antineoplastic chemotherapy: Secondary | ICD-10-CM | POA: Diagnosis not present

## 2023-09-07 DIAGNOSIS — N2581 Secondary hyperparathyroidism of renal origin: Secondary | ICD-10-CM | POA: Diagnosis not present

## 2023-09-07 DIAGNOSIS — E119 Type 2 diabetes mellitus without complications: Secondary | ICD-10-CM | POA: Diagnosis not present

## 2023-09-07 DIAGNOSIS — D631 Anemia in chronic kidney disease: Secondary | ICD-10-CM | POA: Diagnosis not present

## 2023-09-07 LAB — TYPE AND SCREEN
ABO/RH(D): B POS
Antibody Screen: NEGATIVE
Unit division: 0

## 2023-09-07 LAB — BPAM RBC
Blood Product Expiration Date: 202502242359
ISSUE DATE / TIME: 202501230952
Unit Type and Rh: 1700

## 2023-09-07 MED ORDER — PEGFILGRASTIM-FPGK 6 MG/0.6ML ~~LOC~~ SOSY
6.0000 mg | PREFILLED_SYRINGE | Freq: Once | SUBCUTANEOUS | Status: AC
Start: 2023-09-07 — End: 2023-09-07
  Administered 2023-09-07: 6 mg via SUBCUTANEOUS
  Filled 2023-09-07: qty 0.6

## 2023-09-07 NOTE — Progress Notes (Signed)
CONNA TERADA presents today for injection per the provider's orders. Stimufend 6mg  administration without incident; injection site WNL; see MAR for injection details.  Patient tolerated procedure well and without incident.  No questions or complaints noted at this time.   Discharged from clinic ambulatory in stable condition. Alert and oriented x 3. F/U with [facility] as scheduled.

## 2023-09-07 NOTE — Patient Instructions (Signed)
[facility] [place] - A DEPT OF [facility]  Discharge Instructions: Thank you for choosing [facility] to provide your oncology and hematology care.  If you have a lab appointment with the Cancer Center - please note that after April 8th, 2024, all labs will be drawn in the cancer center.  You do not have to check in or register with the main entrance as you have in the past but will complete your check-in in the cancer center.  Wear comfortable clothing and clothing appropriate for easy access to any Portacath or PICC line.   We strive to give you quality time with your provider. You may need to reschedule your appointment if you arrive late (15 or more minutes).  Arriving late affects you and other patients whose appointments are after yours.  Also, if you miss three or more appointments without notifying the office, you may be dismissed from the clinic at the provider's discretion.      For prescription refill requests, have your pharmacy contact our office and allow 72 hours for refills to be completed.    Today you received Stimufend injection     BELOW ARE SYMPTOMS THAT SHOULD BE REPORTED IMMEDIATELY: *FEVER GREATER THAN 100.4 F (38 C) OR HIGHER *CHILLS OR SWEATING *NAUSEA AND VOMITING THAT IS NOT CONTROLLED WITH YOUR NAUSEA MEDICATION *UNUSUAL SHORTNESS OF BREATH *UNUSUAL BRUISING OR BLEEDING *URINARY PROBLEMS (pain or burning when urinating, or frequent urination) *BOWEL PROBLEMS (unusual diarrhea, constipation, pain near the anus) TENDERNESS IN MOUTH AND THROAT WITH OR WITHOUT PRESENCE OF ULCERS (sore throat, sores in mouth, or a toothache) UNUSUAL RASH, SWELLING OR PAIN  UNUSUAL VAGINAL DISCHARGE OR ITCHING   Items with * indicate a potential emergency and should be followed up as soon as possible or go to the Emergency Department if any problems should occur.  Please show the CHEMOTHERAPY ALERT CARD or IMMUNOTHERAPY ALERT CARD at  check-in to the Emergency Department and triage nurse.  Should you have questions after your visit or need to cancel or reschedule your appointment, please contact [facility] [place] - A DEPT OF [facility] Brideg[facility] [phone]  and follow the prompts.  Office hours are 8:00 a.m. to 4:30 p.m. Monday - Friday. Please note that voicemails left after 4:00 p.m. may not be returned until the following business day.  We are closed weekends and major holidays. You have access to a nurse at all times for urgent questions. Please call the main number to the clinic [phone] and follow the prompts.  For any non-urgent questions, you may also contact your provider using MyChart. We now offer e-Visits for anyone 73 and older to request care online for non-urgent symptoms. For details visit [email or web address].   Also download the MyChart app! Go to the app store, search "MyChart", open the app, select [place], and log in with your MyChart username and password.

## 2023-09-10 ENCOUNTER — Encounter (HOSPITAL_COMMUNITY): Payer: Self-pay | Admitting: Hematology

## 2023-09-10 DIAGNOSIS — N2581 Secondary hyperparathyroidism of renal origin: Secondary | ICD-10-CM | POA: Diagnosis not present

## 2023-09-10 DIAGNOSIS — D631 Anemia in chronic kidney disease: Secondary | ICD-10-CM | POA: Diagnosis not present

## 2023-09-10 DIAGNOSIS — E119 Type 2 diabetes mellitus without complications: Secondary | ICD-10-CM | POA: Diagnosis not present

## 2023-09-10 DIAGNOSIS — E1129 Type 2 diabetes mellitus with other diabetic kidney complication: Secondary | ICD-10-CM | POA: Diagnosis not present

## 2023-09-10 DIAGNOSIS — N186 End stage renal disease: Secondary | ICD-10-CM | POA: Diagnosis not present

## 2023-09-10 DIAGNOSIS — Z992 Dependence on renal dialysis: Secondary | ICD-10-CM | POA: Diagnosis not present

## 2023-09-11 ENCOUNTER — Inpatient Hospital Stay: Payer: Medicare Other

## 2023-09-11 ENCOUNTER — Inpatient Hospital Stay: Payer: Medicare Other | Attending: Hematology

## 2023-09-11 ENCOUNTER — Inpatient Hospital Stay: Payer: Medicare Other | Admitting: Hematology

## 2023-09-12 ENCOUNTER — Encounter (HOSPITAL_COMMUNITY): Payer: Self-pay | Admitting: Hematology

## 2023-09-12 DIAGNOSIS — Z992 Dependence on renal dialysis: Secondary | ICD-10-CM | POA: Diagnosis not present

## 2023-09-12 DIAGNOSIS — D631 Anemia in chronic kidney disease: Secondary | ICD-10-CM | POA: Diagnosis not present

## 2023-09-12 DIAGNOSIS — E119 Type 2 diabetes mellitus without complications: Secondary | ICD-10-CM | POA: Diagnosis not present

## 2023-09-12 DIAGNOSIS — N186 End stage renal disease: Secondary | ICD-10-CM | POA: Diagnosis not present

## 2023-09-12 DIAGNOSIS — N2581 Secondary hyperparathyroidism of renal origin: Secondary | ICD-10-CM | POA: Diagnosis not present

## 2023-09-12 DIAGNOSIS — E1129 Type 2 diabetes mellitus with other diabetic kidney complication: Secondary | ICD-10-CM | POA: Diagnosis not present

## 2023-09-13 ENCOUNTER — Inpatient Hospital Stay: Payer: Medicare Other

## 2023-09-14 DIAGNOSIS — N186 End stage renal disease: Secondary | ICD-10-CM | POA: Diagnosis not present

## 2023-09-14 DIAGNOSIS — G5602 Carpal tunnel syndrome, left upper limb: Secondary | ICD-10-CM | POA: Diagnosis not present

## 2023-09-14 DIAGNOSIS — E1129 Type 2 diabetes mellitus with other diabetic kidney complication: Secondary | ICD-10-CM | POA: Diagnosis not present

## 2023-09-14 DIAGNOSIS — N2581 Secondary hyperparathyroidism of renal origin: Secondary | ICD-10-CM | POA: Diagnosis not present

## 2023-09-14 DIAGNOSIS — D631 Anemia in chronic kidney disease: Secondary | ICD-10-CM | POA: Diagnosis not present

## 2023-09-14 DIAGNOSIS — E119 Type 2 diabetes mellitus without complications: Secondary | ICD-10-CM | POA: Diagnosis not present

## 2023-09-14 DIAGNOSIS — Z992 Dependence on renal dialysis: Secondary | ICD-10-CM | POA: Diagnosis not present

## 2023-09-15 DIAGNOSIS — N186 End stage renal disease: Secondary | ICD-10-CM | POA: Diagnosis not present

## 2023-09-15 DIAGNOSIS — Z992 Dependence on renal dialysis: Secondary | ICD-10-CM | POA: Diagnosis not present

## 2023-09-15 DIAGNOSIS — N040 Nephrotic syndrome with minor glomerular abnormality: Secondary | ICD-10-CM | POA: Diagnosis not present

## 2023-09-16 ENCOUNTER — Encounter (HOSPITAL_COMMUNITY): Payer: Self-pay | Admitting: Hematology

## 2023-09-17 DIAGNOSIS — E8770 Fluid overload, unspecified: Secondary | ICD-10-CM | POA: Diagnosis not present

## 2023-09-17 DIAGNOSIS — N2581 Secondary hyperparathyroidism of renal origin: Secondary | ICD-10-CM | POA: Diagnosis not present

## 2023-09-17 DIAGNOSIS — N186 End stage renal disease: Secondary | ICD-10-CM | POA: Diagnosis not present

## 2023-09-17 DIAGNOSIS — D631 Anemia in chronic kidney disease: Secondary | ICD-10-CM | POA: Diagnosis not present

## 2023-09-17 DIAGNOSIS — E119 Type 2 diabetes mellitus without complications: Secondary | ICD-10-CM | POA: Diagnosis not present

## 2023-09-17 DIAGNOSIS — E1129 Type 2 diabetes mellitus with other diabetic kidney complication: Secondary | ICD-10-CM | POA: Diagnosis not present

## 2023-09-17 DIAGNOSIS — Z992 Dependence on renal dialysis: Secondary | ICD-10-CM | POA: Diagnosis not present

## 2023-09-18 ENCOUNTER — Ambulatory Visit (INDEPENDENT_AMBULATORY_CARE_PROVIDER_SITE_OTHER): Payer: Medicare Other | Admitting: Gastroenterology

## 2023-09-18 ENCOUNTER — Encounter: Payer: Self-pay | Admitting: Gastroenterology

## 2023-09-18 ENCOUNTER — Encounter: Payer: Self-pay | Admitting: *Deleted

## 2023-09-18 ENCOUNTER — Telehealth: Payer: Self-pay

## 2023-09-18 VITALS — BP 111/69 | HR 86 | Temp 97.8°F | Ht 61.0 in | Wt 175.4 lb

## 2023-09-18 DIAGNOSIS — R1013 Epigastric pain: Secondary | ICD-10-CM

## 2023-09-18 MED ORDER — SUCRALFATE 1 GM/10ML PO SUSP: 1.0000 g | Freq: Four times a day (QID) | ORAL | 1 refills | Status: DC

## 2023-09-18 NOTE — Addendum Note (Signed)
Addended by: Gelene Mink on: 09/18/2023 04:56 PM   Modules accepted: Orders

## 2023-09-18 NOTE — Telephone Encounter (Signed)
 Completed.

## 2023-09-18 NOTE — Patient Instructions (Signed)
 We are arranging an upper endoscopy in the near future!  Continue pantoprazole  once a day, 30 minutes before breakfast.  I also sent in Carafate  liquid to take 4 times a day before eating.   Further recommendations to follow!   I enjoyed seeing you again today! I value our relationship and want to provide genuine, compassionate, and quality care. You may receive a survey regarding your visit with me, and I welcome your feedback! Thanks so much for taking the time to complete this. I look forward to seeing you again.      Therisa MICAEL Stager, PhD, ANP-BC [facility]

## 2023-09-18 NOTE — Progress Notes (Signed)
 Gastroenterology Office Note     Primary Care Physician:  Duanne Butler DASEN, MD  Primary Gastroenterologist: Dr. Shaaron    Chief Complaint   Chief Complaint  Patient presents with   Abdominal Pain    Abd pain at the top of her stomach.     History of Present Illness   Megan [name] is a 63 y.o. female presenting today with a history of  internal hemorrhoids s/p banding in 2021 and April/May 2023, Cdiff in July 2022, anemia, HFpEF, breast cancer, GERD, HTN, ESRD on dialysis, CAD, HLD, and diabetes, now with abdominal pain.   Followed by Oncology for recurrent metastatic breast cancer, right chest wall mass resection/ One more round of chemo, 09/26/22. Had 16 rounds of radiation first. PET Oct 2024 with intense metabolic activity associated with the gastric antrum and favored inflammatory at that time.  She brings a picture today of mucus string per rectum with blood-tinge that happened recently. No constipation or diarrhea.   Main concern is upper abdominal pain described as ache for the past few months. Hurts all the time. Nothing relieves. Waxes and wanes. Not necessarily worsened with eating. Associated nausea. No food fear. With chemo tastes have changed. No dysphagia. Sometimes jaw tightens up when chewing. No unintentional weight loss. Feels bloated. No constipation.  No GERD. No melena.   No NSAIDs. Pantoprazole  once daily.   Colonoscopy Nov 2022: non-bleeding internal hemorrhoids EGD Nov 2022: normal esophagus, small hiatal hernia, one gastric polyp s/p removal, normal duodenum. Path with hyperplastic polyp.   Past Medical History:  Diagnosis Date   (HFpEF) heart failure with preserved ejection fraction (HCC)    a. 01/2019 Echo: EF 55-60%, mild conc LVH. DD.  Torn MV chordae.   Anemia    Atypical chest pain    a. 08/2018 MV: EF 59%, no ischemia; b. 02/2019 Cath: nonobs dzs.   Blood transfusion without reported diagnosis    Breast cancer (HCC) 10/12/2020    Cataract    ESRD (end stage renal disease) on dialysis [facility])    a. HD T, T, S   Essential hypertension, benign    GERD (gastroesophageal reflux disease)    Headache    Hemorrhoids    Mixed hyperlipidemia    Morbid obesity (HCC)    Non-obstructive CAD (coronary artery disease)    a. 02/2019 CathL LM nl, LAD 35m, LCX nl, RCA 25p, EF 55-65%.   PONV (postoperative nausea and vomiting)    Renal insufficiency    S/P colonoscopy 08/2009   Dr. Rollin: sessile polyp (benign lymphoid), large hemorrhoids, repeat 5-10 years   Temporal arteritis (HCC)    Type 2 diabetes mellitus (HCC)    Wears glasses     Past Surgical History:  Procedure Laterality Date   ABDOMINAL HYSTERECTOMY     APPENDECTOMY     ARTERY BIOPSY N/A 05/09/2018   Procedure: RIGHT TEMPORAL ARTERY BIOPSY;  Surgeon: Kimble Agent, MD;  Location[facility] OR;  Service: General;  Laterality: N/A;   BREAST BIOPSY Right 06/15/2020   Procedure: RIGHT BREAST BIOPSY;  Surgeon: Mavis Anes, MD;  Location: [facility] ORS;  Service: General;  Laterality: Right;   CATARACT EXTRACTION W/PHACO Left 02/09/2017   Procedure: CATARACT EXTRACTION PHACO AND INTRAOCULAR LENS PLACEMENT LEFT EYE;  Surgeon: Perley Hamilton, MD;  Location: [facility] ORS;  Service: Ophthalmology;  Laterality: Left;  CDE: 4.89   CATARACT EXTRACTION W/PHACO Right 06/04/2017   Procedure: CATARACT EXTRACTION PHACO AND INTRAOCULAR LENS PLACEMENT (IOC);  Surgeon: Perley Hamilton,  MD;  Location: [facility] ORS;  Service: Ophthalmology;  Laterality: Right;  CDE: 4.12   CHOLECYSTECTOMY  09/29/2011   Procedure: LAPAROSCOPIC CHOLECYSTECTOMY;  Surgeon: Oneil DELENA Budge, MD;  Location: [facility] OR[facility];  Service: General;  Laterality: N/A;   COLONOSCOPY  08/2009   Dr. Rollin: sessile polyp (benign lymphoid), large hemorrhoids, repeat 5-10 years   COLONOSCOPY N/A 06/12/2016   prominent hemorrhoids   COLONOSCOPY WITH PROPOFOL  N/A 06/16/2021   Procedure: COLONOSCOPY WITH PROPOFOL ;  Surgeon: Shaaron Lamar HERO, MD;  Location: [facility];   Service: Endoscopy;  Laterality: N/A;  9:30am (dialysis pt)   ESOPHAGOGASTRODUODENOSCOPY  09/05/2011   MFM:Dfjoo hiatal hernia; remainder of exam normal. No explanation for patient's abdominal pain with today's examination   ESOPHAGOGASTRODUODENOSCOPY N/A 12/17/2013   Dr. Shaaron: gastric erythema, erosion, mild chronic inflammation on path    ESOPHAGOGASTRODUODENOSCOPY (EGD) WITH PROPOFOL  N/A 06/16/2021   Procedure: ESOPHAGOGASTRODUODENOSCOPY (EGD) WITH PROPOFOL ;  Surgeon: Shaaron Lamar HERO, MD;  Location: [facility];  Service: Endoscopy;  Laterality: N/A;   EXCISION OF BREAST BIOPSY Right 10/12/2020   Procedure: EXCISION OF RIGHT BREAST BIOPSY;  Surgeon: Budge Oneil, MD;  Location: [facility];  Service: General;  Laterality: Right;   IR DIALY SHUNT INTRO NEEDLE/INTRACATH INITIAL W/IMG LEFT Left 07/17/2023   LAPAROSCOPIC APPENDECTOMY  09/29/2011   Procedure: APPENDECTOMY LAPAROSCOPIC;  Surgeon: Oneil DELENA Budge, MD;  Location: [facility];  Service: General;;  incidental appendectomy   LEFT HEART CATH AND CORONARY ANGIOGRAPHY N/A 02/28/2019   Procedure: LEFT HEART CATH AND CORONARY ANGIOGRAPHY;  Surgeon: Dann Candyce RAMAN, MD;  Location: [facility] INVASIVE CV LAB;  Service: Cardiovascular;  Laterality: N/A;   MASS EXCISION Right 01/18/2023   Procedure: EXCISION MASS, RIGHT CHEST S/P MASTECTOMY;  Surgeon: Budge Oneil, MD;  Location: [facility];  Service: General;  Laterality: Right;   MASTECTOMY MODIFIED RADICAL Right 02/18/2020   Procedure: MASTECTOMY MODIFIED RADICAL;  Surgeon: Budge Oneil, MD;  Location: [facility];  Service: General;  Laterality: Right;   MASTECTOMY, PARTIAL Right 07/13/2020   Procedure: RIGHT PARTIAL MASTECTOMY;  Surgeon: Budge Oneil, MD;  Location: [facility];  Service: General;  Laterality: Right;   PARTIAL MASTECTOMY WITH NEEDLE LOCALIZATION AND AXILLARY SENTINEL LYMPH NODE BX Right 09/18/2018   Procedure: RIGHT PARTIAL MASTECTOMY AFTER NEEDLE LOCALIZATION, SENTINEL LYMPH NODE BIOPSY RIGHT  AXILLA;  Surgeon: Budge Oneil, MD;  Location: [facility];  Service: General;  Laterality: Right;   POLYPECTOMY  06/16/2021   Procedure: POLYPECTOMY;  Surgeon: Shaaron Lamar HERO, MD;  [facility];  Service: Endoscopy;;   PORTACATH PLACEMENT Right 06/07/2023   Procedure: INSERTION PORT-A-CATH, RIGHT  (DIALYSIS ACCESS ON LEFT);  Surgeon: Budge Oneil, MD;  Location: [facility];  Service: General;  Laterality: Right;   SIMPLE MASTECTOMY WITH AXILLARY SENTINEL NODE BIOPSY Left 06/15/2020   Procedure: LEFT SIMPLE MASTECTOMY;  Surgeon: Budge Oneil, MD;  Location: [facility];  Service: General;  Laterality: Left;    Current Outpatient Medications  Medication Sig Dispense Refill   acetaminophen  (TYLENOL ) 500 MG tablet Take 500-1,000 mg by mouth every 6 (six) hours as needed (pain.).     amLODipine  (NORVASC ) 10 MG tablet TAKE 1 TABLET BY MOUTH AT BEDTIME 90 tablet 3   anastrozole  (ARIMIDEX ) 1 MG tablet TAKE 1 TABLET BY MOUTH DAILY 90 tablet 3   aspirin  EC 81 MG tablet Take 81 mg by mouth in the morning.     atorvastatin  (LIPITOR) 20 MG tablet TAKE 1 TABLET(20 MG) BY MOUTH DAILY 90 tablet 0  B Complex-C-Zn-Folic Acid  (DIALYVITE  800-ZINC  15) 0.8 MG TABS Take 1 tablet by mouth in the morning.     cephALEXin  (KEFLEX ) 500 MG capsule Take 1 capsule every 12 hours by oral route for 7 days.     cinacalcet  (SENSIPAR ) 30 MG tablet Take 2 tablets (60 mg total) by mouth daily. (Patient taking differently: Take 90 mg by mouth daily with breakfast. With a meal) 90 tablet 0   Darbepoetin Alfa  (ARANESP , ALBUMIN  FREE, IJ) Darbepoetin Alfa  (Aranesp )     fluticasone  (FLONASE ) 50 MCG/ACT nasal spray Place 2 sprays into both nostrils daily. 16 g 6   gabapentin  (NEURONTIN ) 300 MG capsule TAKE 1 CAPSULE(300 MG) BY MOUTH AT BEDTIME 90 capsule 3   iron  sucrose (VENOFER ) 20 MG/ML injection Inject 50 mg into the vein once a week.     lanthanum  (FOSRENOL ) 1000 MG chewable tablet Chew 2,000-3,000 mg by mouth See admin  instructions. Take 3 tablets (3000 mg) by mouth with meals and take 2 tablets (2000 mg) with snacks     lidocaine -prilocaine  (EMLA ) cream Apply 1 application  topically every Monday, Wednesday, and Friday with hemodialysis.     meclizine  (ANTIVERT ) 25 MG tablet Take 1 tablet (25 mg total) by mouth 3 (three) times daily as needed for dizziness. 30 tablet 1   nebivolol  (BYSTOLIC ) 10 MG tablet TAKE 1 TABLET BY MOUTH IN THE MORNING AND AT BEDTIME 180 tablet 0   nitroGLYCERIN  (NITROSTAT ) 0.4 MG SL tablet Place 1 tablet (0.4 mg total) under the tongue every 5 (five) minutes x 3 doses as needed for chest pain (if no relief after 3rd dose, proceed to ED or call 911). 25 tablet 3   ondansetron  (ZOFRAN ) 4 MG tablet Take 2 tablets (8 mg total) by mouth every 8 (eight) hours as needed for nausea or vomiting. 90 tablet 0   oxyCODONE  (ROXICODONE ) 5 MG immediate release tablet Take 1 tablet (5 mg total) by mouth every 6 (six) hours as needed. 30 tablet 0   pantoprazole  (PROTONIX ) 40 MG tablet TAKE 1 TABLET(40 MG) BY MOUTH DAILY 30 tablet 1   prochlorperazine  (COMPAZINE ) 10 MG tablet Take 1 tablet (10 mg total) by mouth every 6 (six) hours as needed for nausea or vomiting. 60 tablet 3   VITAMIN D  PO Take 0.25 mcg by mouth daily.     No current facility-administered medications for this visit.    Allergies as of 09/18/2023 - Review Complete 09/18/2023  Allergen Reaction Noted   Motrin [ibuprofen] Other (See Comments) 06/25/2023    Family History  Problem Relation Age of Onset   Hypertension Mother    Coronary artery disease Mother    Diabetes Mother    Hypertension Sister    Coronary artery disease Sister    Hypertension Brother    Heart attack Father    Hypertension Son    Heart attack Maternal Aunt    Hypertension Maternal Aunt    Diabetes Maternal Aunt    Heart attack Maternal Uncle    Hypertension Maternal Uncle    Diabetes Maternal Uncle    Heart attack Paternal Aunt    Hypertension Paternal  Aunt    Diabetes Paternal Aunt    Heart attack Paternal Uncle    Hypertension Paternal Uncle    Diabetes Paternal Uncle    Heart attack Maternal Grandmother    Heart attack Maternal Grandfather    Heart attack Paternal Grandmother    Heart attack Paternal Grandfather    Colon cancer Neg Hx  Social History   Socioeconomic History   Marital status: Married    Spouse name: Not on file   Number of children: Not on file   Years of education: Not on file   Highest education level: 12th grade  Occupational History   Occupation: Systems Developer: [employer]  Tobacco Use   Smoking status: Never    Passive exposure: Never   Smokeless tobacco: Never  Vaping Use   Vaping status: Never Used  Substance and Sexual Activity   Alcohol use: No   Drug use: No   Sexual activity: Yes    Birth control/protection: Surgical  Other Topics Concern   Not on file  Social History Narrative   Works at [employer] in [place].    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   Also has to get items from high shelves-causes achy pain in shoulder area      Married.   Children are grown, out of house.   Social Drivers of Corporate Investment Banker Strain: Low Risk  (06/08/2023)   Overall Financial Resource Strain (CARDIA)    Difficulty of Paying Living Expenses: Not hard at all  Food Insecurity: Low Risk  (09/14/2023)   Received from [facility]   Hunger Vital Sign    Worried About Running Out of Food in the Last Year: Never true    Ran Out of Food in the Last Year: Never true  Transportation Needs: No Transportation Needs (09/14/2023)   Received from [facility]    In the past 12 months, has lack of reliable transportation kept you from medical appointments, meetings, work or from getting things needed for daily living? : No  Physical Activity: Inactive (06/08/2023)   Exercise Vital Sign    Days of Exercise per Week: 0 days    Minutes of Exercise per  Session: 0 min  Stress: No Stress Concern Present (06/08/2023)   [employer] of Occupational Health - Occupational Stress Questionnaire    Feeling of Stress : Not at all  Social Connections: Moderately Integrated (06/08/2023)   Social Connection and Isolation Panel [NHANES]    Frequency of Communication with Friends and Family: More than three times a week    Frequency of Social Gatherings with Friends and Family: Once a week    Attends Religious Services: More than 4 times per year    Active Member of [employer] or Organizations: No    Attends Engineer, Structural: Not on file    Marital Status: Married  Catering Manager Violence: Not At Risk (09/14/2022)   Humiliation, Afraid, Rape, and Kick questionnaire    Fear of Current or Ex-Partner: No    Emotionally Abused: No    Physically Abused: No    Sexually Abused: No     Review of Systems   Gen: Denies any fever, chills, fatigue, weight loss, lack of appetite.  CV: Denies chest pain, heart palpitations, peripheral edema, syncope.  Resp: Denies shortness of breath at rest or with exertion. Denies wheezing or cough.  GI: Denies dysphagia or odynophagia. Denies jaundice, hematemesis, fecal incontinence. GU : Denies urinary burning, urinary frequency, urinary hesitancy MS: Denies joint pain, muscle weakness, cramps, or limitation of movement.  Derm: Denies rash, itching, dry skin Psych: Denies depression, anxiety, memory loss, and confusion Heme: Denies bruising, bleeding, and enlarged lymph nodes.   Physical Exam   BP 111/69   Pulse 86  Temp 97.8 F (36.6 C)   Ht 5' 1 (1.549 m)   Wt 175 lb 6.4 oz (79.6 kg)   BMI 33.14 kg/m  General:   Alert and oriented. Pleasant and cooperative. Well-nourished and well-developed.  Head:  Normocephalic and atraumatic. Eyes:  Without icterus Abdomen:  +BS, soft, TTP LUQ and epigastric and non-distended. No HSM noted. No guarding or rebound. No masses appreciated.  Rectal:   Deferred  Msk:  Symmetrical without gross deformities. Normal posture. Extremities:  Without edema. Neurologic:  Alert and  oriented x4;  grossly normal neurologically. Skin:  Intact without significant lesions or rashes. Psych:  Alert and cooperative. Normal mood and affect.   Assessment   Megan [name] is a 63 y.o. female presenting today with a history of internal hemorrhoids s/p banding, Cdiff in July 2022, anemia, HFpEF, breast cancer, GERD, HTN, ESRD on dialysis, CAD, HLD, and diabetes, now with new onset upper abdominal pain for past few months.   Dyspepsia: waxing and waning without obvious postprandial component. Denying dysphagia but feels bloated. I note on PET Oct 2024 she had intense metabolic activity associated with the gastric antrum and favored inflammatory at that time. Gallbladder absent. Will pursue EGD in near future for diagnostic purposes. Last in 2022 with hyperplastic gastric polyp.     PLAN   Proceed with upper endoscopy by Dr. Shaaron in near future: the risks, benefits, and alternatives have been discussed with the patient in detail. The patient states understanding and desires to proceed.  Continue pantoprazole  daily Carafate  QID Further recommendations to follow    Therisa MICAEL Stager, PhD, ANP-BC [facility]

## 2023-09-18 NOTE — Telephone Encounter (Signed)
Megan [name], Please send in the other carafate because the suspension keeps coming over here on Cover My Meds. I faxed that script back but I guess they have yet to put it in the system

## 2023-09-18 NOTE — H&P (View-Only) (Signed)
 Gastroenterology Office Note     Primary Care Physician:  Duanne Butler DASEN, MD  Primary Gastroenterologist: Dr. Shaaron    Chief Complaint   Chief Complaint  Patient presents with   Abdominal Pain    Abd pain at the top of her stomach.     History of Present Illness   Megan [name] is a 63 y.o. female presenting today with a history of  internal hemorrhoids s/p banding in 2021 and April/May 2023, Cdiff in July 2022, anemia, HFpEF, breast cancer, GERD, HTN, ESRD on dialysis, CAD, HLD, and diabetes, now with abdominal pain.   Followed by Oncology for recurrent metastatic breast cancer, right chest wall mass resection/ One more round of chemo, 09/26/22. Had 16 rounds of radiation first. PET Oct 2024 with intense metabolic activity associated with the gastric antrum and favored inflammatory at that time.  She brings a picture today of mucus string per rectum with blood-tinge that happened recently. No constipation or diarrhea.   Main concern is upper abdominal pain described as ache for the past few months. Hurts all the time. Nothing relieves. Waxes and wanes. Not necessarily worsened with eating. Associated nausea. No food fear. With chemo tastes have changed. No dysphagia. Sometimes jaw tightens up when chewing. No unintentional weight loss. Feels bloated. No constipation.  No GERD. No melena.   No NSAIDs. Pantoprazole  once daily.   Colonoscopy Nov 2022: non-bleeding internal hemorrhoids EGD Nov 2022: normal esophagus, small hiatal hernia, one gastric polyp s/p removal, normal duodenum. Path with hyperplastic polyp.   Past Medical History:  Diagnosis Date   (HFpEF) heart failure with preserved ejection fraction (HCC)    a. 01/2019 Echo: EF 55-60%, mild conc LVH. DD.  Torn MV chordae.   Anemia    Atypical chest pain    a. 08/2018 MV: EF 59%, no ischemia; b. 02/2019 Cath: nonobs dzs.   Blood transfusion without reported diagnosis    Breast cancer (HCC) 10/12/2020    Cataract    ESRD (end stage renal disease) on dialysis [facility])    a. HD T, T, S   Essential hypertension, benign    GERD (gastroesophageal reflux disease)    Headache    Hemorrhoids    Mixed hyperlipidemia    Morbid obesity (HCC)    Non-obstructive CAD (coronary artery disease)    a. 02/2019 CathL LM nl, LAD 35m, LCX nl, RCA 25p, EF 55-65%.   PONV (postoperative nausea and vomiting)    Renal insufficiency    S/P colonoscopy 08/2009   Dr. Rollin: sessile polyp (benign lymphoid), large hemorrhoids, repeat 5-10 years   Temporal arteritis (HCC)    Type 2 diabetes mellitus (HCC)    Wears glasses     Past Surgical History:  Procedure Laterality Date   ABDOMINAL HYSTERECTOMY     APPENDECTOMY     ARTERY BIOPSY N/A 05/09/2018   Procedure: RIGHT TEMPORAL ARTERY BIOPSY;  Surgeon: Kimble Agent, MD;  Location: [facility] OR;  Service: General;  Laterality: N/A;   BREAST BIOPSY Right 06/15/2020   Procedure: RIGHT BREAST BIOPSY;  Surgeon: Mavis Anes, MD;  Location: [facility] ORS;  Service: General;  Laterality: Right;   CATARACT EXTRACTION W/PHACO Left 02/09/2017   Procedure: CATARACT EXTRACTION PHACO AND INTRAOCULAR LENS PLACEMENT LEFT EYE;  Surgeon: Perley Hamilton, MD;  Location: [facility] OR[facility];  Service: Ophthalmology;  Laterality: Left;  CDE: 4.89   CATARACT EXTRACTION W/PHACO Right 06/04/2017   Procedure: CATARACT EXTRACTION PHACO AND INTRAOCULAR LENS PLACEMENT (IOC);  Surgeon: Perley Hamilton,  MD;  Location: [facility] ORS;  Service: Ophthalmology;  Laterality: Right;  CDE: 4.12   CHOLECYSTECTOMY  09/29/2011   Procedure: LAPAROSCOPIC CHOLECYSTECTOMY;  Surgeon: Oneil DELENA Budge, MD;  Location: [facility] ORS;  Service: General;  Laterality: N/A;   COLONOSCOPY  08/2009   Dr. Rollin: sessile polyp (benign lymphoid), large hemorrhoids, repeat 5-10 years   COLONOSCOPY N/A 06/12/2016   prominent hemorrhoids   COLONOSCOPY WITH PROPOFOL  N/A 06/16/2021   Procedure: COLONOSCOPY WITH PROPOFOL ;  Surgeon: Shaaron Lamar HERO, MD;  Location: [facility];   Service: Endoscopy;  Laterality: N/A;  9:30am (dialysis pt)   ESOPHAGOGASTRODUODENOSCOPY  09/05/2011   MFM:Dfjoo hiatal hernia; remainder of exam normal. No explanation for patient's abdominal pain with today's examination   ESOPHAGOGASTRODUODENOSCOPY N/A 12/17/2013   Dr. Shaaron: gastric erythema, erosion, mild chronic inflammation on path    ESOPHAGOGASTRODUODENOSCOPY (EGD) WITH PROPOFOL  N/A 06/16/2021   Procedure: ESOPHAGOGASTRODUODENOSCOPY (EGD) WITH PROPOFOL ;  Surgeon: Shaaron Lamar HERO, MD;  Location: [facility];  Service: Endoscopy;  Laterality: N/A;   EXCISION OF BREAST BIOPSY Right 10/12/2020   Procedure: EXCISION OF RIGHT BREAST BIOPSY;  Surgeon: Budge Oneil, MD;  Location: [facility];  Service: General;  Laterality: Right;   IR DIALY SHUNT INTRO NEEDLE/INTRACATH INITIAL W/IMG LEFT Left 07/17/2023   LAPAROSCOPIC APPENDECTOMY  09/29/2011   Procedure: APPENDECTOMY LAPAROSCOPIC;  Surgeon: Oneil DELENA Budge, MD;  Location: [facility];  Service: General;;  incidental appendectomy   LEFT HEART CATH AND CORONARY ANGIOGRAPHY N/A 02/28/2019   Procedure: LEFT HEART CATH AND CORONARY ANGIOGRAPHY;  Surgeon: Dann Candyce RAMAN, MD;  Location: [facility] INVASIVE CV LAB;  Service: Cardiovascular;  Laterality: N/A;   MASS EXCISION Right 01/18/2023   Procedure: EXCISION MASS, RIGHT CHEST S/P MASTECTOMY;  Surgeon: Budge Oneil, MD;  Location: [facility];  Service: General;  Laterality: Right;   MASTECTOMY MODIFIED RADICAL Right 02/18/2020   Procedure: MASTECTOMY MODIFIED RADICAL;  Surgeon: Budge Oneil, MD;  Location: [facility];  Service: General;  Laterality: Right;   MASTECTOMY, PARTIAL Right 07/13/2020   Procedure: RIGHT PARTIAL MASTECTOMY;  Surgeon: Budge Oneil, MD;  Location: [facility];  Service: General;  Laterality: Right;   PARTIAL MASTECTOMY WITH NEEDLE LOCALIZATION AND AXILLARY SENTINEL LYMPH NODE BX Right 09/18/2018   Procedure: RIGHT PARTIAL MASTECTOMY AFTER NEEDLE LOCALIZATION, SENTINEL LYMPH NODE BIOPSY RIGHT  AXILLA;  Surgeon: Budge Oneil, MD;  Location: [facility];  Service: General;  Laterality: Right;   POLYPECTOMY  06/16/2021   Procedure: POLYPECTOMY;  Surgeon: Shaaron Lamar HERO, MD;  Location: [facility];  Service: Endoscopy;;   PORTACATH PLACEMENT Right 06/07/2023   Procedure: INSERTION PORT-A-CATH, RIGHT  (DIALYSIS ACCESS ON LEFT);  Surgeon: Budge Oneil, MD;  Location: [facility];  Service: General;  Laterality: Right;   SIMPLE MASTECTOMY WITH AXILLARY SENTINEL NODE BIOPSY Left 06/15/2020   Procedure: LEFT SIMPLE MASTECTOMY;  Surgeon: Budge Oneil, MD;  Location: [facility];  Service: General;  Laterality: Left;    Current Outpatient Medications  Medication Sig Dispense Refill   acetaminophen  (TYLENOL ) 500 MG tablet Take 500-1,000 mg by mouth every 6 (six) hours as needed (pain.).     amLODipine  (NORVASC ) 10 MG tablet TAKE 1 TABLET BY MOUTH AT BEDTIME 90 tablet 3   anastrozole  (ARIMIDEX ) 1 MG tablet TAKE 1 TABLET BY MOUTH DAILY 90 tablet 3   aspirin  EC 81 MG tablet Take 81 mg by mouth in the morning.     atorvastatin  (LIPITOR) 20 MG tablet TAKE 1 TABLET(20 MG) BY MOUTH DAILY 90 tablet 0  B Complex-C-Zn-Folic Acid  (DIALYVITE  800-ZINC  15) 0.8 MG TABS Take 1 tablet by mouth in the morning.     cephALEXin  (KEFLEX ) 500 MG capsule Take 1 capsule every 12 hours by oral route for 7 days.     cinacalcet  (SENSIPAR ) 30 MG tablet Take 2 tablets (60 mg total) by mouth daily. (Patient taking differently: Take 90 mg by mouth daily with breakfast. With a meal) 90 tablet 0   Darbepoetin Alfa  (ARANESP , ALBUMIN  FREE, IJ) Darbepoetin Alfa  (Aranesp )     fluticasone  (FLONASE ) 50 MCG/ACT nasal spray Place 2 sprays into both nostrils daily. 16 g 6   gabapentin  (NEURONTIN ) 300 MG capsule TAKE 1 CAPSULE(300 MG) BY MOUTH AT BEDTIME 90 capsule 3   iron  sucrose (VENOFER ) 20 MG/ML injection Inject 50 mg into the vein once a week.     lanthanum  (FOSRENOL ) 1000 MG chewable tablet Chew 2,000-3,000 mg by mouth See admin  instructions. Take 3 tablets (3000 mg) by mouth with meals and take 2 tablets (2000 mg) with snacks     lidocaine -prilocaine  (EMLA ) cream Apply 1 application  topically every Monday, Wednesday, and Friday with hemodialysis.     meclizine  (ANTIVERT ) 25 MG tablet Take 1 tablet (25 mg total) by mouth 3 (three) times daily as needed for dizziness. 30 tablet 1   nebivolol  (BYSTOLIC ) 10 MG tablet TAKE 1 TABLET BY MOUTH IN THE MORNING AND AT BEDTIME 180 tablet 0   nitroGLYCERIN  (NITROSTAT ) 0.4 MG SL tablet Place 1 tablet (0.4 mg total) under the tongue every 5 (five) minutes x 3 doses as needed for chest pain (if no relief after 3rd dose, proceed to ED or call 911). 25 tablet 3   ondansetron  (ZOFRAN ) 4 MG tablet Take 2 tablets (8 mg total) by mouth every 8 (eight) hours as needed for nausea or vomiting. 90 tablet 0   oxyCODONE  (ROXICODONE ) 5 MG immediate release tablet Take 1 tablet (5 mg total) by mouth every 6 (six) hours as needed. 30 tablet 0   pantoprazole  (PROTONIX ) 40 MG tablet TAKE 1 TABLET(40 MG) BY MOUTH DAILY 30 tablet 1   prochlorperazine  (COMPAZINE ) 10 MG tablet Take 1 tablet (10 mg total) by mouth every 6 (six) hours as needed for nausea or vomiting. 60 tablet 3   VITAMIN D  PO Take 0.25 mcg by mouth daily.     No current facility-administered medications for this visit.    Allergies as of 09/18/2023 - Review Complete 09/18/2023  Allergen Reaction Noted   Motrin [ibuprofen] Other (See Comments) 06/25/2023    Family History  Problem Relation Age of Onset   Hypertension Mother    Coronary artery disease Mother    Diabetes Mother    Hypertension Sister    Coronary artery disease Sister    Hypertension Brother    Heart attack Father    Hypertension Son    Heart attack Maternal Aunt    Hypertension Maternal Aunt    Diabetes Maternal Aunt    Heart attack Maternal Uncle    Hypertension Maternal Uncle    Diabetes Maternal Uncle    Heart attack Paternal Aunt    Hypertension Paternal  Aunt    Diabetes Paternal Aunt    Heart attack Paternal Uncle    Hypertension Paternal Uncle    Diabetes Paternal Uncle    Heart attack Maternal Grandmother    Heart attack Maternal Grandfather    Heart attack Paternal Grandmother    Heart attack Paternal Grandfather    Colon cancer Neg Hx  Social History   Socioeconomic History   Marital status: Married    Spouse name: Not on file   Number of children: Not on file   Years of education: Not on file   Highest education level: 12th grade  Occupational History   Occupation: Systems Developer: [employer]  Tobacco Use   Smoking status: Never    Passive exposure: Never   Smokeless tobacco: Never  Vaping Use   Vaping status: Never Used  Substance and Sexual Activity   Alcohol use: No   Drug use: No   Sexual activity: Yes    Birth control/protection: Surgical  Other Topics Concern   Not on file  Social History Narrative   Works at [employer] in [place].    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   Also has to get items from high shelves-causes achy pain in shoulder area      Married.   Children are grown, out of house.   Social Drivers of Corporate Investment Banker Strain: Low Risk  (06/08/2023)   Overall Financial Resource Strain (CARDIA)    Difficulty of Paying Living Expenses: Not hard at all  Food Insecurity: Low Risk  (09/14/2023)   Received from [facility]   Hunger Vital Sign    Worried About Running Out of Food in the Last Year: Never true    Ran Out of Food in the Last Year: Never true  Transportation Needs: No Transportation Needs (09/14/2023)   Received from [facility]    In the past 12 months, has lack of reliable transportation kept you from medical appointments, meetings, work or from getting things needed for daily living? : No  Physical Activity: Inactive (06/08/2023)   Exercise Vital Sign    Days of Exercise per Week: 0 days    Minutes of Exercise per  Session: 0 min  Stress: No Stress Concern Present (06/08/2023)   [employer] of Occupational Health - Occupational Stress Questionnaire    Feeling of Stress : Not at all  Social Connections: Moderately Integrated (06/08/2023)   Social Connection and Isolation Panel [NHANES]    Frequency of Communication with Friends and Family: More than three times a week    Frequency of Social Gatherings with Friends and Family: Once a week    Attends Religious Services: More than 4 times per year    Active Member of [employer] or Organizations: No    Attends Engineer, Structural: Not on file    Marital Status: Married  Catering Manager Violence: Not At Risk (09/14/2022)   Humiliation, Afraid, Rape, and Kick questionnaire    Fear of Current or Ex-Partner: No    Emotionally Abused: No    Physically Abused: No    Sexually Abused: No     Review of Systems   Gen: Denies any fever, chills, fatigue, weight loss, lack of appetite.  CV: Denies chest pain, heart palpitations, peripheral edema, syncope.  Resp: Denies shortness of breath at rest or with exertion. Denies wheezing or cough.  GI: Denies dysphagia or odynophagia. Denies jaundice, hematemesis, fecal incontinence. GU : Denies urinary burning, urinary frequency, urinary hesitancy MS: Denies joint pain, muscle weakness, cramps, or limitation of movement.  Derm: Denies rash, itching, dry skin Psych: Denies depression, anxiety, memory loss, and confusion Heme: Denies bruising, bleeding, and enlarged lymph nodes.   Physical Exam   BP 111/69   Pulse 86  Temp 97.8 F (36.6 C)   Ht 5' 1 (1.549 m)   Wt 175 lb 6.4 oz (79.6 kg)   BMI 33.14 kg/m  General:   Alert and oriented. Pleasant and cooperative. Well-nourished and well-developed.  Head:  Normocephalic and atraumatic. Eyes:  Without icterus Abdomen:  +BS, soft, TTP LUQ and epigastric and non-distended. No HSM noted. No guarding or rebound. No masses appreciated.  Rectal:   Deferred  Msk:  Symmetrical without gross deformities. Normal posture. Extremities:  Without edema. Neurologic:  Alert and  oriented x4;  grossly normal neurologically. Skin:  Intact without significant lesions or rashes. Psych:  Alert and cooperative. Normal mood and affect.   Assessment   Megan [name] is a 63 y.o. female presenting today with a history of internal hemorrhoids s/p banding, Cdiff in July 2022, anemia, HFpEF, breast cancer, GERD, HTN, ESRD on dialysis, CAD, HLD, and diabetes, now with new onset upper abdominal pain for past few months.   Dyspepsia: waxing and waning without obvious postprandial component. Denying dysphagia but feels bloated. I note on PET Oct 2024 she had intense metabolic activity associated with the gastric antrum and favored inflammatory at that time. Gallbladder absent. Will pursue EGD in near future for diagnostic purposes. Last in 2022 with hyperplastic gastric polyp.     PLAN   Proceed with upper endoscopy by Dr. Shaaron in near future: the risks, benefits, and alternatives have been discussed with the patient in detail. The patient states understanding and desires to proceed.  Continue pantoprazole  daily Carafate  QID Further recommendations to follow    Megan MICAEL Stager, PhD, ANP-BC [facility]

## 2023-09-19 ENCOUNTER — Encounter (HOSPITAL_COMMUNITY): Payer: Self-pay | Admitting: Hematology

## 2023-09-19 ENCOUNTER — Ambulatory Visit: Payer: Medicare Other | Admitting: Orthopedic Surgery

## 2023-09-19 DIAGNOSIS — D631 Anemia in chronic kidney disease: Secondary | ICD-10-CM | POA: Diagnosis not present

## 2023-09-19 DIAGNOSIS — E1129 Type 2 diabetes mellitus with other diabetic kidney complication: Secondary | ICD-10-CM | POA: Diagnosis not present

## 2023-09-19 DIAGNOSIS — N2581 Secondary hyperparathyroidism of renal origin: Secondary | ICD-10-CM | POA: Diagnosis not present

## 2023-09-19 DIAGNOSIS — N186 End stage renal disease: Secondary | ICD-10-CM | POA: Diagnosis not present

## 2023-09-19 DIAGNOSIS — Z992 Dependence on renal dialysis: Secondary | ICD-10-CM | POA: Diagnosis not present

## 2023-09-19 DIAGNOSIS — E119 Type 2 diabetes mellitus without complications: Secondary | ICD-10-CM | POA: Diagnosis not present

## 2023-09-19 NOTE — Telephone Encounter (Signed)
Megan [name],  The pharmacy keeps sending that same drug change request back again. Insurance will not do the suspension but will cover Sucralfate but I dont know what strength, directions, quantity and refills. Please advise

## 2023-09-20 ENCOUNTER — Ambulatory Visit: Payer: Medicare Other | Admitting: *Deleted

## 2023-09-20 DIAGNOSIS — Z0000 Encounter for general adult medical examination without abnormal findings: Secondary | ICD-10-CM | POA: Diagnosis not present

## 2023-09-20 MED ORDER — SUCRALFATE 1 G PO TABS: 1.0000 g | ORAL_TABLET | Freq: Three times a day (TID) | ORAL | 1 refills | Status: DC

## 2023-09-20 NOTE — Addendum Note (Signed)
 Addended by: Delman Ferns on: 09/20/2023 04:19 PM   Modules accepted: Orders

## 2023-09-20 NOTE — Patient Instructions (Signed)
 Megan [name] , Thank you for taking time to come for your Medicare Wellness Visit. I appreciate your ongoing commitment to your health goals. Please review the following plan we discussed and let me know if I can assist you in the future.   Screening recommendations/referrals: Colonoscopy: up to date Recommended yearly ophthalmology/optometry visit for glaucoma screening and checkup Recommended yearly dental visit for hygiene and checkup  Vaccinations: Influenza vaccine: up to date Pneumococcal vaccine: up to date Tdap vaccine: Education provided Shingles vaccine: Education provided       Preventive Care 65 Years and Older, Female Preventive care refers to lifestyle choices and visits with your health care provider that can promote health and wellness. What does preventive care include? A yearly physical exam. This is also called an annual well check. Dental exams once or twice a year. Routine eye exams. Ask your health care provider how often you should have your eyes checked. Personal lifestyle choices, including: Daily care of your teeth and gums. Regular physical activity. Eating a healthy diet. Avoiding tobacco and drug use. Limiting alcohol use. Practicing safe sex. Taking low-dose aspirin  every day. Taking vitamin and mineral supplements as recommended by your health care provider. What happens during an annual well check? The services and screenings done by your health care provider during your annual well check will depend on your age, overall health, lifestyle risk factors, and family history of disease. Counseling  Your health care provider may ask you questions about your: Alcohol use. Tobacco use. Drug use. Emotional well-being. Home and relationship well-being. Sexual activity. Eating habits. History of falls. Memory and ability to understand (cognition). Work and work astronomer. Reproductive health. Screening  You may have the following tests or  measurements: Height, weight, and BMI. Blood pressure. Lipid and cholesterol levels. These may be checked every 5 years, or more frequently if you are over 62 years old. Skin check. Lung cancer screening. You may have this screening every year starting at age 62 if you have a 30-pack-year history of smoking and currently smoke or have quit within the past 15 years. Fecal occult blood test (FOBT) of the stool. You may have this test every year starting at age 62. Flexible sigmoidoscopy or colonoscopy. You may have a sigmoidoscopy every 5 years or a colonoscopy every 10 years starting at age 62. Hepatitis C blood test. Hepatitis B blood test. Sexually transmitted disease (STD) testing. Diabetes screening. This is done by checking your blood sugar (glucose) after you have not eaten for a while (fasting). You may have this done every 1-3 years. Bone density scan. This is done to screen for osteoporosis. You may have this done starting at age 62. Mammogram. This may be done every 1-2 years. Talk to your health care provider about how often you should have regular mammograms. Talk with your health care provider about your test results, treatment options, and if necessary, the need for more tests. Vaccines  Your health care provider may recommend certain vaccines, such as: Influenza vaccine. This is recommended every year. Tetanus, diphtheria, and acellular pertussis (Tdap, Td) vaccine. You may need a Td booster every 10 years. Zoster vaccine. You may need this after age 62. Pneumococcal 13-valent conjugate (PCV13) vaccine. One dose is recommended after age 62. Pneumococcal polysaccharide (PPSV23) vaccine. One dose is recommended after age 63. Talk to your health care provider about which screenings and vaccines you need and how often you need them. This information is not intended to replace advice given to you  by your health care provider. Make sure you discuss any questions you have with your  health care provider. Document Released: 08/27/2015 Document Revised: 04/19/2016 Document Reviewed: 06/01/2015 Elsevier Interactive Patient Education  2017 [employer].  Fall Prevention in the Home Falls can cause injuries. They can happen to people of all ages. There are many things you can do to make your home safe and to help prevent falls. What can I do on the outside of my home? Regularly fix the edges of walkways and driveways and fix any cracks. Remove anything that might make you trip as you walk through a door, such as a raised step or threshold. Trim any bushes or trees on the path to your home. Use bright outdoor lighting. Clear any walking paths of anything that might make someone trip, such as rocks or tools. Regularly check to see if handrails are loose or broken. Make sure that both sides of any steps have handrails. Any raised decks and porches should have guardrails on the edges. Have any leaves, snow, or ice cleared regularly. Use sand or salt on walking paths during winter. Clean up any spills in your garage right away. This includes oil or grease spills. What can I do in the bathroom? Use night lights. Install grab bars by the toilet and in the tub and shower. Do not use towel bars as grab bars. Use non-skid mats or decals in the tub or shower. If you need to sit down in the shower, use a plastic, non-slip stool. Keep the floor dry. Clean up any water  that spills on the floor as soon as it happens. Remove soap buildup in the tub or shower regularly. Attach bath mats securely with double-sided non-slip rug tape. Do not have throw rugs and other things on the floor that can make you trip. What can I do in the bedroom? Use night lights. Make sure that you have a light by your bed that is easy to reach. Do not use any sheets or blankets that are too big for your bed. They should not hang down onto the floor. Have a firm chair that has side arms. You can use this for  support while you get dressed. Do not have throw rugs and other things on the floor that can make you trip. What can I do in the kitchen? Clean up any spills right away. Avoid walking on wet floors. Keep items that you use a lot in easy-to-reach places. If you need to reach something above you, use a strong step stool that has a grab bar. Keep electrical cords out of the way. Do not use floor polish or wax that makes floors slippery. If you must use wax, use non-skid floor wax. Do not have throw rugs and other things on the floor that can make you trip. What can I do with my stairs? Do not leave any items on the stairs. Make sure that there are handrails on both sides of the stairs and use them. Fix handrails that are broken or loose. Make sure that handrails are as long as the stairways. Check any carpeting to make sure that it is firmly attached to the stairs. Fix any carpet that is loose or worn. Avoid having throw rugs at the top or bottom of the stairs. If you do have throw rugs, attach them to the floor with carpet tape. Make sure that you have a light switch at the top of the stairs and the bottom of the stairs. If  you do not have them, ask someone to add them for you. What else can I do to help prevent falls? Wear shoes that: Do not have high heels. Have rubber bottoms. Are comfortable and fit you well. Are closed at the toe. Do not wear sandals. If you use a stepladder: Make sure that it is fully opened. Do not climb a closed stepladder. Make sure that both sides of the stepladder are locked into place. Ask someone to hold it for you, if possible. Clearly mark and make sure that you can see: Any grab bars or handrails. First and last steps. Where the edge of each step is. Use tools that help you move around (mobility aids) if they are needed. These include: Canes. Walkers. Scooters. Crutches. Turn on the lights when you go into a dark area. Replace any light bulbs as soon  as they burn out. Set up your furniture so you have a clear path. Avoid moving your furniture around. If any of your floors are uneven, fix them. If there are any pets around you, be aware of where they are. Review your medicines with your doctor. Some medicines can make you feel dizzy. This can increase your chance of falling. Ask your doctor what other things that you can do to help prevent falls. This information is not intended to replace advice given to you by your health care provider. Make sure you discuss any questions you have with your health care provider. Document Released: 05/27/2009 Document Revised: 01/06/2016 Document Reviewed: 09/04/2014 Elsevier Interactive Patient Education  2017 [employer].

## 2023-09-20 NOTE — Progress Notes (Signed)
 Subjective:   Megan [name] is a 63 y.o. female who presents for Medicare Annual (Subsequent) preventive examination.  Visit Complete: Virtual I connected with  Megan [name] on 09/20/23 by a audio enabled telemedicine application and verified that I am speaking with the correct person using two identifiers.  Patient Location: Home  Provider Location: Home Office  I discussed the limitations of evaluation and management by telemedicine. The patient expressed understanding and agreed to proceed.  Vital Signs: Because this visit was a virtual/telehealth visit, some criteria may be missing or patient reported. Any vitals not documented were not able to be obtained and vitals that have been documented are patient reported.  Patient Medicare AWV questionnaire was completed by the patient on 09-17-2023; I have confirmed that all information answered by patient is correct and no changes since this date.  Cardiac Risk Factors include: advanced age (>47men, >56 women);hypertension;obesity (BMI >30kg/m2)     Objective:    There were no vitals filed for this visit. There is no height or weight on file to calculate BMI.     09/20/2023   11:49 AM 09/07/2023    1:33 PM 09/06/2023    9:14 AM 08/16/2023    9:01 AM 07/26/2023   11:04 AM 07/26/2023    9:15 AM 07/26/2023    8:55 AM  Advanced Directives  Does Patient Have a Medical Advance Directive? Yes Yes Yes Yes Yes Yes Yes  Type of Advance Directive Healthcare Power of Attorney Living will;Healthcare Power of [employer] Power of [place];Living will Healthcare Power of [place];Living will Living will;Healthcare Power of [employer] Power of [place];Living will Healthcare Power of [place];Living will  Does patient want to make changes to medical advance directive?  No - Patient declined No - Patient declined  No - Patient declined  No - Patient declined  Copy of Healthcare Power of Attorney in Chart? No - copy  requested  No - copy requested No - copy requested  No - copy requested No - copy requested  Would patient like information on creating a medical advance directive?  No - Patient declined No - Patient declined No - Patient declined No - Patient declined No - Patient declined No - Patient declined    Current Medications (verified) Outpatient Encounter Medications as of 09/20/2023  Medication Sig   acetaminophen  (TYLENOL ) 500 MG tablet Take 500-1,000 mg by mouth every 6 (six) hours as needed (pain.).   amLODipine  (NORVASC ) 10 MG tablet TAKE 1 TABLET BY MOUTH AT BEDTIME   anastrozole  (ARIMIDEX ) 1 MG tablet TAKE 1 TABLET BY MOUTH DAILY   aspirin  EC 81 MG tablet Take 81 mg by mouth in the morning.   atorvastatin  (LIPITOR) 20 MG tablet TAKE 1 TABLET(20 MG) BY MOUTH DAILY   B Complex-C-Zn-Folic Acid  (DIALYVITE  800-ZINC  15) 0.8 MG TABS Take 1 tablet by mouth in the morning.   cinacalcet  (SENSIPAR ) 30 MG tablet Take 2 tablets (60 mg total) by mouth daily. (Patient taking differently: Take 90 mg by mouth daily with breakfast. With a meal)   Darbepoetin Alfa  (ARANESP , ALBUMIN  FREE, IJ) Darbepoetin Alfa  (Aranesp )   fluticasone  (FLONASE ) 50 MCG/ACT nasal spray Place 2 sprays into both nostrils daily.   gabapentin  (NEURONTIN ) 300 MG capsule TAKE 1 CAPSULE(300 MG) BY MOUTH AT BEDTIME   iron  sucrose (VENOFER ) 20 MG/ML injection Inject 50 mg into the vein once a week.   lanthanum  (FOSRENOL ) 1000 MG chewable tablet Chew 2,000-3,000 mg by mouth See admin instructions. Take 3 tablets (  3000 mg) by mouth with meals and take 2 tablets (2000 mg) with snacks   lidocaine -prilocaine  (EMLA ) cream Apply 1 application  topically every Monday, Wednesday, and Friday with hemodialysis.   meclizine  (ANTIVERT ) 25 MG tablet Take 1 tablet (25 mg total) by mouth 3 (three) times daily as needed for dizziness.   nebivolol  (BYSTOLIC ) 10 MG tablet TAKE 1 TABLET BY MOUTH IN THE MORNING AND AT BEDTIME   nitroGLYCERIN  (NITROSTAT ) 0.4 MG SL  tablet Place 1 tablet (0.4 mg total) under the tongue every 5 (five) minutes x 3 doses as needed for chest pain (if no relief after 3rd dose, proceed to ED or call 911).   ondansetron  (ZOFRAN ) 4 MG tablet Take 2 tablets (8 mg total) by mouth every 8 (eight) hours as needed for nausea or vomiting.   oxyCODONE  (ROXICODONE ) 5 MG immediate release tablet Take 1 tablet (5 mg total) by mouth every 6 (six) hours as needed.   pantoprazole  (PROTONIX ) 40 MG tablet TAKE 1 TABLET(40 MG) BY MOUTH DAILY   prochlorperazine  (COMPAZINE ) 10 MG tablet Take 1 tablet (10 mg total) by mouth every 6 (six) hours as needed for nausea or vomiting.   sucralfate  (CARAFATE ) 1 GM/10ML suspension Take 10 mLs (1 g total) by mouth 4 (four) times daily.   sucralfate  (CARAFATE ) 1 GM/10ML suspension Take 10 mLs (1 g total) by mouth 4 (four) times daily.   VITAMIN D  PO Take 0.25 mcg by mouth daily.   No facility-administered encounter medications on file as of 09/20/2023.    Allergies (verified) Ibuprofen   History: Past Medical History:  Diagnosis Date   (HFpEF) heart failure with preserved ejection fraction (HCC)    a. 01/2019 Echo: EF 55-60%, mild conc LVH. DD.  Torn MV chordae.   Anemia    Atypical chest pain    a. 08/2018 MV: EF 59%, no ischemia; b. 02/2019 Cath: nonobs dzs.   Blood transfusion without reported diagnosis    Breast cancer (HCC) 10/12/2020   Cataract    ESRD (end stage renal disease) on dialysis [facility])    a. HD T, T, S   Essential hypertension, benign    GERD (gastroesophageal reflux disease)    Headache    Hemorrhoids    Mixed hyperlipidemia    Morbid obesity (HCC)    Non-obstructive CAD (coronary artery disease)    a. 02/2019 CathL LM nl, LAD 86m, LCX nl, RCA 25p, EF 55-65%.   PONV (postoperative nausea and vomiting)    Renal insufficiency    S/P colonoscopy 08/2009   Dr. Rollin: sessile polyp (benign lymphoid), large hemorrhoids, repeat 5-10 years   Temporal arteritis (HCC)    Type 2 diabetes  mellitus (HCC)    Wears glasses    Past Surgical History:  Procedure Laterality Date   ABDOMINAL HYSTERECTOMY     APPENDECTOMY     ARTERY BIOPSY N/A 05/09/2018   Procedure: RIGHT TEMPORAL ARTERY BIOPSY;  Surgeon: Kimble Agent, MD;  Location: [facility] OR;  Service: General;  Laterality: N/A;   BREAST BIOPSY Right 06/15/2020   Procedure: RIGHT BREAST BIOPSY;  Surgeon: Mavis Anes, MD;  Location: [facility] ORS;  Service: General;  Laterality: Right;   CATARACT EXTRACTION W/PHACO Left 02/09/2017   Procedure: CATARACT EXTRACTION PHACO AND INTRAOCULAR LENS PLACEMENT LEFT EYE;  Surgeon: Perley Hamilton, MD;  Location: [facility];  Service: Ophthalmology;  Laterality: Left;  CDE: 4.89   CATARACT EXTRACTION W/PHACO Right 06/04/2017   Procedure: CATARACT EXTRACTION PHACO AND INTRAOCULAR LENS PLACEMENT (IOC);  Surgeon:  Perley Hamilton, MD;  Location: [facility];  Service: Ophthalmology;  Laterality: Right;  CDE: 4.12   CHOLECYSTECTOMY  09/29/2011   Procedure: LAPAROSCOPIC CHOLECYSTECTOMY;  Surgeon: Oneil DELENA Budge, MD;  Location: [facility];  Service: General;  Laterality: N/A;   COLONOSCOPY  08/2009   Dr. Rollin: sessile polyp (benign lymphoid), large hemorrhoids, repeat 5-10 years   COLONOSCOPY N/A 06/12/2016   prominent hemorrhoids   COLONOSCOPY WITH PROPOFOL  N/A 06/16/2021   Procedure: COLONOSCOPY WITH PROPOFOL ;  Surgeon: Shaaron Lamar HERO, MD;  Location[facility];  Service: Endoscopy;  Laterality: N/A;  9:30am (dialysis pt)   ESOPHAGOGASTRODUODENOSCOPY  09/05/2011   MFM:Dfjoo hiatal hernia; remainder of exam normal. No explanation for patient's abdominal pain with today's examination   ESOPHAGOGASTRODUODENOSCOPY N/A 12/17/2013   Dr. Shaaron: gastric erythema, erosion, mild chronic inflammation on path    ESOPHAGOGASTRODUODENOSCOPY (EGD) WITH PROPOFOL  N/A 06/16/2021   Procedure: ESOPHAGOGASTRODUODENOSCOPY (EGD) WITH PROPOFOL ;  Surgeon: Shaaron Lamar HERO, MD;  [facility];  Service: Endoscopy;  Laterality: N/A;    EXCISION OF BREAST BIOPSY Right 10/12/2020   Procedure: EXCISION OF RIGHT BREAST BIOPSY;  Surgeon: Budge Oneil, MD;  Location: [facility];  Service: General;  Laterality: Right;   IR DIALY SHUNT INTRO NEEDLE/INTRACATH INITIAL W/IMG LEFT Left 07/17/2023   LAPAROSCOPIC APPENDECTOMY  09/29/2011   Procedure: APPENDECTOMY LAPAROSCOPIC;  Surgeon: Oneil DELENA Budge, MD;  Location: [facility];  Service: General;;  incidental appendectomy   LEFT HEART CATH AND CORONARY ANGIOGRAPHY N/A 02/28/2019   Procedure: LEFT HEART CATH AND CORONARY ANGIOGRAPHY;  Surgeon: Dann Candyce RAMAN, MD;  Location: [facility] INVASIVE CV LAB;  Service: Cardiovascular;  Laterality: N/A;   MASS EXCISION Right 01/18/2023   Procedure: EXCISION MASS, RIGHT CHEST S/P MASTECTOMY;  Surgeon: Budge Oneil, MD;  Location: [facility];  Service: General;  Laterality: Right;   MASTECTOMY MODIFIED RADICAL Right 02/18/2020   Procedure: MASTECTOMY MODIFIED RADICAL;  Surgeon: Budge Oneil, MD;  Location: [facility];  Service: General;  Laterality: Right;   MASTECTOMY, PARTIAL Right 07/13/2020   Procedure: RIGHT PARTIAL MASTECTOMY;  Surgeon: Budge Oneil, MD;  Location: [facility];  Service: General;  Laterality: Right;   PARTIAL MASTECTOMY WITH NEEDLE LOCALIZATION AND AXILLARY SENTINEL LYMPH NODE BX Right 09/18/2018   Procedure: RIGHT PARTIAL MASTECTOMY AFTER NEEDLE LOCALIZATION, SENTINEL LYMPH NODE BIOPSY RIGHT AXILLA;  Surgeon: Budge Oneil, MD;  Location: [facility];  Service: General;  Laterality: Right;   POLYPECTOMY  06/16/2021   Procedure: POLYPECTOMY;  Surgeon: Shaaron Lamar HERO, MD;  [facility];  Service: Endoscopy;;   PORTACATH PLACEMENT Right 06/07/2023   Procedure: INSERTION PORT-A-CATH, RIGHT  (DIALYSIS ACCESS ON LEFT);  Surgeon: Budge Oneil, MD;  Location: [facility];  Service: General;  Laterality: Right;   SIMPLE MASTECTOMY WITH AXILLARY SENTINEL NODE BIOPSY Left 06/15/2020   Procedure: LEFT SIMPLE MASTECTOMY;  Surgeon: Budge Oneil, MD;  [facility]  [facility];  Service: General;  Laterality: Left;   Family History  Problem Relation Age of Onset   Hypertension Mother    Coronary artery disease Mother    Diabetes Mother    Hypertension Sister    Coronary artery disease Sister    Hypertension Brother    Heart attack Father    Hypertension Son    Heart attack Maternal Aunt    Hypertension Maternal Aunt    Diabetes Maternal Aunt    Heart attack Maternal Uncle    Hypertension Maternal Uncle    Diabetes Maternal Uncle    Heart attack Paternal  Aunt    Hypertension Paternal Aunt    Diabetes Paternal Aunt    Heart attack Paternal Uncle    Hypertension Paternal Uncle    Diabetes Paternal Uncle    Heart attack Maternal Grandmother    Heart attack Maternal Grandfather    Heart attack Paternal Grandmother    Heart attack Paternal Grandfather    Colon cancer Neg Hx    Social History   Socioeconomic History   Marital status: Married    Spouse name: Not on file   Number of children: Not on file   Years of education: Not on file   Highest education level: 12th grade  Occupational History   Occupation: Systems Developer: [employer]  Tobacco Use   Smoking status: Never    Passive exposure: Never   Smokeless tobacco: Never  Vaping Use   Vaping status: Never Used  Substance and Sexual Activity   Alcohol use: No   Drug use: No   Sexual activity: Yes    Birth control/protection: Surgical  Other Topics Concern   Not on file  Social History Narrative   Works at [employer] in [place].    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   Also has to get items from high shelves-causes achy pain in shoulder area      Married.   Children are grown, out of house.   Social Drivers of Corporate Investment Banker Strain: Low Risk  (09/20/2023)   Overall Financial Resource Strain (CARDIA)    Difficulty of Paying Living Expenses: Not hard at all  Food Insecurity: No Food Insecurity (09/20/2023)   Hunger Vital Sign     Worried About Running Out of Food in the Last Year: Never true    Ran Out of Food in the Last Year: Never true  Transportation Needs: No Transportation Needs (09/20/2023)   PRAPARE - Administrator, Civil Service (Medical): No    Lack of Transportation (Non-Medical): No  Physical Activity: Inactive (09/20/2023)   Exercise Vital Sign    Days of Exercise per Week: 0 days    Minutes of Exercise per Session: 0 min  Stress: No Stress Concern Present (09/20/2023)   [employer] of Occupational Health - Occupational Stress Questionnaire    Feeling of Stress : Not at all  Social Connections: Moderately Integrated (09/20/2023)   Social Connection and Isolation Panel [NHANES]    Frequency of Communication with Friends and Family: More than three times a week    Frequency of Social Gatherings with Friends and Family: Three times a week    Attends Religious Services: More than 4 times per year    Active Member of Clubs or Organizations: No    Attends Banker Meetings: Never    Marital Status: Married    Tobacco Counseling Counseling given: Not Answered   Clinical Intake:  Pre-visit preparation completed: Yes  Pain : No/denies pain     Diabetes: No  How often do you need to have someone help you when you read instructions, pamphlets, or other written materials from your doctor or pharmacy?: 1 - Never  Interpreter Needed?: No  Information entered by :: Mliss Graff LPN   Activities of Daily Living    09/20/2023   11:46 AM 09/17/2023    5:50 PM  In your present state of health, do you have any difficulty performing the following activities:  Hearing? 0 0  Vision?  0 0  Difficulty concentrating or making decisions? 0 0  Walking or climbing stairs? 1 1  Dressing or bathing? 0 0  Doing errands, shopping? 1 1  Preparing Food and eating ? N N  Using the Toilet? N N  In the past six months, have you accidently leaked urine? N N  Do you have problems with loss of  bowel control? N N  Managing your Medications? N N  Managing your Finances? N N  Housekeeping or managing your Housekeeping? N N    Patient Care Team: Duanne Butler DASEN, MD as PCP - General (Family Medicine) Alvan Dorn FALCON, MD as PCP - Cardiology (Cardiology) Shaaron Lamar HERO, MD (Gastroenterology) Debera Jayson MATSU, MD as Consulting Physician (Cardiology) Rogers Hai, MD as Medical Oncologist (Medical Oncology) Rayburn Pac, MD as Consulting Physician (Nephrology)  Indicate any recent Medical Services you may have received from other than [facility] providers in the past year (date may be approximate).     Assessment:   This is a routine wellness examination for Janeece.  Hearing/Vision screen Hearing Screening - Comments:: No trouble hearing Vision Screening - Comments:: Not up to date Goes every couple years   Goals Addressed             This Visit's Progress    Patient Stated       Would like to get a kidney        Depression Screen    09/20/2023   11:48 AM 06/12/2023    9:53 AM 09/14/2022    3:54 PM 09/09/2021   11:57 AM 04/05/2021    8:55 AM 01/06/2021    3:32 PM 07/16/2019   11:23 AM  PHQ 2/9 Scores  PHQ - 2 Score 0 0 0 0 0 0 0  PHQ- 9 Score 4          Fall Risk    09/20/2023   11:55 AM 09/17/2023    5:50 PM 06/12/2023    9:53 AM 09/14/2022    3:50 PM 09/09/2021   12:01 PM  Fall Risk   Falls in the past year? 0 0 0 0 0  Number falls in past yr: 0  0 0 0  Injury with Fall? 0  0 0 0  Risk for fall due to :     No Fall Risks  Follow up Falls evaluation completed;Education provided;Falls prevention discussed   Falls prevention discussed;Education provided;Falls evaluation completed Falls prevention discussed    MEDICARE RISK AT HOME: Medicare Risk at Home Any stairs in or around the home?: No Home free of loose throw rugs in walkways, pet beds, electrical cords, etc?: Yes Adequate lighting in your home to reduce risk of falls?: Yes Life alert?:  No Use of a cane, walker or w/c?: No Grab bars in the bathroom?: No Shower chair or bench in shower?: No Elevated toilet seat or a handicapped toilet?: No  TIMED UP AND GO:  Was the test performed?  No    Cognitive Function:        09/20/2023   11:45 AM 09/14/2022    4:07 PM 09/09/2021   12:03 PM  6CIT Screen  What Year? 0 points 0 points 0 points  What month? 0 points 0 points 0 points  What time? 0 points 0 points 0 points  Count back from 20 0 points 0 points 0 points  Months in reverse 0 points 0 points 2 points  Repeat phrase 0 points 0 points 2 points  Total  Score 0 points 0 points 4 points    Immunizations Immunization History  Administered Date(s) Administered   Hepatitis B 03/27/2017   Hepatitis B, ADULT 03/27/2017, 05/02/2017, 05/31/2017, 10/04/2017, 09/08/2019, 10/01/2019, 10/30/2019   Hepb-cpg 03/03/2020   Influenza Whole 05/22/2007   Influenza, Quadrivalent, Recombinant, Inj, Pf 05/19/2022   Influenza, Seasonal, Injecte, Preservative Fre 05/10/2018   Influenza,inj,Quad PF,6+ Mos 04/29/2019, 05/10/2020, 05/06/2021   Moderna Sars-Covid-2 Vaccination 11/11/2019, 12/10/2019, 07/02/2020   Pfizer Covid-19 Vaccine Bivalent Booster 32yrs & up 09/09/2021   Pneumococcal Conjugate-13 03/15/2017, 02/07/2019   Pneumococcal Polysaccharide-23 08/27/2015, 10/01/2020   Td 07/20/2005    TDAP status: Due, Education has been provided regarding the importance of this vaccine. Advised may receive this vaccine at local pharmacy or Health Dept. Aware to provide a copy of the vaccination record if obtained from local pharmacy or Health Dept. Verbalized acceptance and understanding.  Flu Vaccine status: Up to date  Pneumococcal vaccine status: Up to date  Covid-19 vaccine status: Information provided on how to obtain vaccines.   Qualifies for Shingles Vaccine? Yes   Zostavax completed No   Shingrix Completed?: No.    Education has been provided regarding the importance of this  vaccine. Patient has been advised to call insurance company to determine out of pocket expense if they have not yet received this vaccine. Advised may also receive vaccine at local pharmacy or Health Dept. Verbalized acceptance and understanding.  Screening Tests Health Maintenance  Topic Date Due   Zoster Vaccines- Shingrix (1 of 2) Never done   DTaP/Tdap/Td (2 - Tdap) 07/21/2015   OPHTHALMOLOGY EXAM  09/15/2015   FOOT EXAM  08/26/2016   Cervical Cancer Screening (HPV/Pap Cotest)  06/04/2022   COVID-19 Vaccine (5 - 2024-25 season) 04/15/2023   Hepatitis C Screening  06/11/2024 (Originally 03/26/1979)   HEMOGLOBIN A1C  12/11/2023   Medicare Annual Wellness (AWV)  09/19/2024   MAMMOGRAM  03/03/2025   Pneumococcal Vaccine 60-35 Years old (4 of 4 - PPSV23 or PCV20) 03/27/2027   Colonoscopy  06/17/2031   INFLUENZA VACCINE  Completed   HIV Screening  Completed   HPV VACCINES  Aged Out    Health Maintenance  Health Maintenance Due  Topic Date Due   Zoster Vaccines- Shingrix (1 of 2) Never done   DTaP/Tdap/Td (2 - Tdap) 07/21/2015   OPHTHALMOLOGY EXAM  09/15/2015   FOOT EXAM  08/26/2016   Cervical Cancer Screening (HPV/Pap Cotest)  06/04/2022   COVID-19 Vaccine (5 - 2024-25 season) 04/15/2023    Colorectal cancer screening: Type of screening: Colonoscopy. Completed 2022. Repeat every 10 years    Lung Cancer Screening: (Low Dose CT Chest recommended if Age 8-80 years, 20 pack-year currently smoking OR have quit w/in 15years.) does not qualify.   Lung Cancer Screening Referral:   Additional Screening:  Hepatitis C Screening:    never done  Vision Screening: Recommended annual ophthalmology exams for early detection of glaucoma and other disorders of the eye. Is the patient up to date with their annual eye exam?  No  Who is the provider or what is the name of the office in which the patient attends annual eye exams?  If pt is not established with a provider, would they like to  be referred to a provider to establish care? No .   Dental Screening: Recommended annual dental exams for proper oral hygiene    Community Resource Referral / Chronic Care Management: CRR required this visit?  No   CCM required this visit?  No  Plan:     I have personally reviewed and noted the following in the patient's chart:   Medical and social history Use of alcohol, tobacco or illicit drugs  Current medications and supplements including opioid prescriptions. Patient is not currently taking opioid prescriptions. Functional ability and status Nutritional status Physical activity Advanced directives List of other physicians Hospitalizations, surgeries, and ER visits in previous 12 months Vitals Screenings to include cognitive, depression, and falls Referrals and appointments  In addition, I have reviewed and discussed with patient certain preventive protocols, quality metrics, and best practice recommendations. A written personalized care plan for preventive services as well as general preventive health recommendations were provided to patient.     Mliss Graff, LPN   02/13/7973   After Visit Summary: (MyChart) Due to this being a telephonic visit, the after visit summary with patients personalized plan was offered to patient via MyChart   Nurse Notes:

## 2023-09-21 DIAGNOSIS — E1129 Type 2 diabetes mellitus with other diabetic kidney complication: Secondary | ICD-10-CM | POA: Diagnosis not present

## 2023-09-21 DIAGNOSIS — N2581 Secondary hyperparathyroidism of renal origin: Secondary | ICD-10-CM | POA: Diagnosis not present

## 2023-09-21 DIAGNOSIS — Z992 Dependence on renal dialysis: Secondary | ICD-10-CM | POA: Diagnosis not present

## 2023-09-21 DIAGNOSIS — E119 Type 2 diabetes mellitus without complications: Secondary | ICD-10-CM | POA: Diagnosis not present

## 2023-09-21 DIAGNOSIS — N186 End stage renal disease: Secondary | ICD-10-CM | POA: Diagnosis not present

## 2023-09-21 DIAGNOSIS — D631 Anemia in chronic kidney disease: Secondary | ICD-10-CM | POA: Diagnosis not present

## 2023-09-24 DIAGNOSIS — N764 Abscess of vulva: Secondary | ICD-10-CM | POA: Diagnosis not present

## 2023-09-24 DIAGNOSIS — E119 Type 2 diabetes mellitus without complications: Secondary | ICD-10-CM | POA: Diagnosis not present

## 2023-09-24 DIAGNOSIS — Z992 Dependence on renal dialysis: Secondary | ICD-10-CM | POA: Diagnosis not present

## 2023-09-24 DIAGNOSIS — E1129 Type 2 diabetes mellitus with other diabetic kidney complication: Secondary | ICD-10-CM | POA: Diagnosis not present

## 2023-09-24 DIAGNOSIS — N2581 Secondary hyperparathyroidism of renal origin: Secondary | ICD-10-CM | POA: Diagnosis not present

## 2023-09-24 DIAGNOSIS — D631 Anemia in chronic kidney disease: Secondary | ICD-10-CM | POA: Diagnosis not present

## 2023-09-24 DIAGNOSIS — N186 End stage renal disease: Secondary | ICD-10-CM | POA: Diagnosis not present

## 2023-09-26 DIAGNOSIS — Z992 Dependence on renal dialysis: Secondary | ICD-10-CM | POA: Diagnosis not present

## 2023-09-26 DIAGNOSIS — E119 Type 2 diabetes mellitus without complications: Secondary | ICD-10-CM | POA: Diagnosis not present

## 2023-09-26 DIAGNOSIS — E1129 Type 2 diabetes mellitus with other diabetic kidney complication: Secondary | ICD-10-CM | POA: Diagnosis not present

## 2023-09-26 DIAGNOSIS — N2581 Secondary hyperparathyroidism of renal origin: Secondary | ICD-10-CM | POA: Diagnosis not present

## 2023-09-26 DIAGNOSIS — D631 Anemia in chronic kidney disease: Secondary | ICD-10-CM | POA: Diagnosis not present

## 2023-09-26 DIAGNOSIS — N186 End stage renal disease: Secondary | ICD-10-CM | POA: Diagnosis not present

## 2023-09-26 NOTE — Progress Notes (Signed)
[facility] [address], [place] [address]    Clinic Day:  09/27/23   Referring physician: Donita Brooks, MD  Patient Care Team: Donita Brooks, MD as PCP - General (Family Medicine) [name] [name], MD as PCP - Cardiology (Cardiology) [name], MD (Gastroenterology) [name], MD as Consulting Physician (Cardiology) [name], MD as Medical Oncologist (Medical Oncology) [name], MD as Consulting Physician (Nephrology)   ASSESSMENT & PLAN:   Assessment: 1.  Stage Ib (T1CN0) right breast cancer, ER positive, PR and HER-2 negative: -Right lumpectomy and sentinel lymph node biopsy on 09/18/2018 shows 1.5 cm IDC, grade 3, associated with high-grade DCIS, negative margins, 0/3 lymph nodes positive, ER 50%, PR negative and HER-2 negative, Ki-67 60%. -Oncotype DX recurrence score 47.  Distant recurrence at 9 years with tamoxifen alone is 36%.  Absolute chemotherapy benefit is more than 15%. -Adjuvant chemotherapy with 4 cycles of AC from 10/30/2018 through 01/01/2019. -She was started on anastrozole in June 2020. -Mammogram on 09/02/2019 showed calcifications in the posterior aspect of upper outer quadrant of the right breast. -Right partial mastectomy on 02/18/2020 shows high-grade DCIS, 1.5 cm, no invasive carcinoma, resection margins negative.  0/11 lymph nodes.  ER 30% positive, PR negative. -Left simple mastectomy on 06/15/2020 was benign.  Right breast biopsy shows microscopic focus of invasive ductal carcinoma in the background of extensive high-grade DCIS. -Right breast lumpectomy on 07/13/2020 shows multifocal invasive ductal carcinoma, grade 3, largest measuring 1.2 cm.  Extensive high-grade DCIS with necrosis.  Margins are free.  PT1CNX.  There are multiple foci of invasive carcinoma arising from extensive DCIS.  Several small foci of invasive tumor arising from DCIS indicating new primaries.  ER/PR/HER-2 is negative.   Ki-67 is 20%. -PET scan on 09/14/2020 shows areas of nodularity with discrete nodule in the fat of the inferior chest wall within the subcutaneous tissues. -Ultrasound of the right chest wall shows masslike abnormality in the upper inner aspect of the right anterior chest at 1 o'clock position measuring 4.1 cm in greatest dimension.  1.7 cm hypoechoic mass in the central anterior right chest concerning for malignancy.  Possible borderline enlarged residual right axillary lymph node. -Right breast biopsy on 10/12/2020 shows 1.3 cm invasive ductal carcinoma, focally 0.1 cm from superior margin.  DCIS is less than 0.1 cm from superior margin.  PT1CPNX. - Right mastectomy followed by radiation therapy completed in May 2022. - PET scan (11/30/2022): Recurrent/metastatic disease within the right medial pectoralis musculature. - Biopsy (12/19/2022): Metastatic breast adenocarcinoma.  ER positive, 40%, weak staining.  PR negative.  Ki-67 30%.  HER2 IHC 0. - Right chest wall mass resection on 01/18/2023.  Pathology shows 1.7 cm IDC, grade 3.  DCIS with high-grade with necrosis.  Carcinoma involves inked inferior margin with focally invading into atrophic skeletal muscle. - Her case was discussed at multidisciplinary clinic at [facility] on 04/18/2023. - Surgical oncology felt that resection margins are adequate and recommended radiation therapy.  She was also evaluated by medical oncology (Dr. Iona Hansen) who has recommended systemic therapy with 6 cycles of docetaxel, carboplatin and pembrolizumab. - XRT from 05/07/2023 through 05/25/2023. - Cycle 1 carboplatin, docetaxel and pembrolizumab on 06/14/2023  2.  Osteopenia: -Bone density on 01/20/2019 shows T score -1.9. - DEXA scan (03/24/2021) T score -2.4  3.  ESRD on HD: - She is on HD on Monday/Wednesday/Friday under the direction of Dr. Arrie Aran.    Plan: 1.  Recurrent right breast cancer, ER 40%, weak staining, PR negative, HER2 negative: - She has completed 5  cycles of chemotherapy. - She was evaluated by Dr. Merlyn Lot for left hand numbness.  She reported that she also developed some numbness in the right hand fingertips which is new since last cycle.  She was apparently referred to another specialist. - Reviewed labs today: Normal LFTs.  White count is normal at 7 with normal differential.  Platelet count is normal.  Hemoglobin 7.9. - She reportedly developed a cyst in the rectal area which is draining.  She was started on antibiotic Keflex every 6 hours on 09/25/2023 by her GYN doctor. - She may proceed with her cycle 6 today.  She was instructed to go to the ER should she develop any fever.  She will receive 1 unit PRBC. - She will come back in 4 to 6 weeks with CT CAP with contrast.   2.  Hypertension: - Continue Norvasc 10 mg daily and Bystolic 10 mg daily.  Blood pressure is 115/62.  Denies any syncopal episodes during or after dialysis.   3.  Neuropathy: - Neuropathy in the feet has been stable.  She has developed new numbness in the right hand fingertips since last treatment.   4.  Right chest wall pain: -This is well-controlled.  Not requiring oxycodone on regular basis.     Orders Placed This Encounter  Procedures   CT CHEST ABDOMEN PELVIS W CONTRAST    Standing Status:   Future    Expected Date:   11/08/2023    Expiration Date:   09/26/2024    If indicated for the ordered procedure, I authorize the administration of contrast media per Radiology protocol:   Yes    Does the patient have a contrast media/X-ray dye allergy?:   No    Preferred imaging location?:   [facility]    If indicated for the ordered procedure, I authorize the administration of oral contrast media per Radiology protocol:   Yes       I,[name],acting as a scribe for [name], MD.,have documented all relevant documentation on the behalf of [name], MD,as directed by  [name], MD while in the presence of [name], MD.  I, [name] MD, have reviewed the above documentation for accuracy and completeness, and I agree with the above.     [name], MD   2/13/20251:16 PM  CHIEF COMPLAINT:   Diagnosis: recurrent right breast cancer    Cancer Staging  Malignant neoplasm of upper-inner quadrant of right female breast [facility]) Staging form: Breast, AJCC 8th Edition - Clinical stage from 09/26/2018: Stage IB (cT1c, cN0, cM0, G3, ER+, PR-, HER2-) - Signed by [name], MD on 09/26/2018    Prior Therapy: 1. Right lumpectomy and sentinel lymph node biopsy on 09/18/2018  2. Adjuvant chemotherapy with 4 cycles of AC from 10/30/2018 through 01/01/2019  3. Right partial mastectomy on 02/18/2020  4. Right breast lumpectomy on 07/13/2020  5. Right mastectomy followed by radiation therapy completed in May 2022 6.  Anastrozole started in June 2020  Current Therapy: Carboplatin, docetaxel, pembrolizumab   HISTORY OF PRESENT ILLNESS:   Oncology History  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)  09/18/2018 Initial Diagnosis   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)   09/26/2018 Cancer Staging   Staging form: Breast, AJCC 8th Edition - Clinical stage from 09/26/2018: Stage IB (cT1c, cN0, cM0,  G3, ER+, PR-, HER2-) - Signed by [name], MD on 09/26/2018   10/30/2018 - 01/01/2019 Chemotherapy   Patient is on Treatment Plan : BREAST Adjuvant AC q21d     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 female presenting to clinic today for follow up of recurrent right breast cancer. She was last seen by me on 09/06/23.  Mrs. Durkin has an EGD  scheduled for 10/04/23 with Dr. Jena Gauss.   Today, she states that she is doing well overall. Her appetite level is at 50%. Her energy level is at 50%. She is accompanied by her husband.   She reports a rectal cyst that appeared 2 weeks ago, and has had recurrent genital cysts. Cysts produce bloody puss and the size of the cyst has not diminished since starting Keflex on 09/25/23. She saw Dr. Henderson Cloud on 09/24/23, who prescribed her Keflex q6h, and has follow-up with him in 2 weeks. The cysts are painful, and she has been treating the pain with hot cloths.   She reports new onset intermittent right hand tingling. Takyia has seen Dr. Merlyn Lot, who thinks the tingling is likely due to chemotherapy.   PAST MEDICAL HISTORY:   Past Medical History: Past Medical History:  Diagnosis Date   (HFpEF) heart failure with preserved ejection fraction (HCC)    a. 01/2019 Echo: EF 55-60%, mild conc LVH. DD.  Torn MV chordae.   Anemia    Atypical chest pain    a. 08/2018 MV: EF 59%, no ischemia; b. 02/2019 Cath: nonobs dzs.   Blood transfusion without reported diagnosis    Breast cancer (HCC) 10/12/2020   Cataract    ESRD (end stage renal disease) on dialysis [facility])    a. HD T, T, S   Essential hypertension, benign    GERD (gastroesophageal reflux disease)    Headache    Hemorrhoids    Mixed hyperlipidemia    Morbid obesity (HCC)    Non-obstructive CAD (coronary artery disease)    a. 02/2019 CathL LM nl, LAD 13m, LCX nl, RCA 25p, EF 55-65%.   PONV (postoperative nausea and vomiting)    Renal insufficiency    S/P colonoscopy 08/2009   Dr. Elnoria Howard: sessile polyp (benign lymphoid), large hemorrhoids, repeat 5-10 years   Temporal arteritis (HCC)    Type 2 diabetes mellitus (HCC)    Wears glasses     Surgical History: Past Surgical History:  Procedure Laterality Date   ABDOMINAL HYSTERECTOMY     APPENDECTOMY     ARTERY BIOPSY N/A 05/09/2018   Procedure: RIGHT TEMPORAL ARTERY BIOPSY;  Surgeon: Jimmye Norman, MD;   Location: [facility] OR;  Service: General;  Laterality: N/A;   BREAST BIOPSY Right 06/15/2020   Procedure: RIGHT BREAST BIOPSY;  Surgeon: Franky Macho, MD;  Location: [facility] ORS;  Service: General;  Laterality: Right;   CATARACT EXTRACTION W/PHACO Left 02/09/2017   Procedure: CATARACT EXTRACTION PHACO AND INTRAOCULAR LENS PLACEMENT LEFT EYE;  Surgeon: Gemma Payor, MD;  Location: [facility] ORS;  Service: Ophthalmology;  Laterality: Left;  CDE: 4.89   CATARACT EXTRACTION W/PHACO Right 06/04/2017   Procedure: CATARACT EXTRACTION PHACO AND INTRAOCULAR LENS PLACEMENT (IOC);  Surgeon: Gemma Payor, MD;  Location: [facility] ORS;  Service: Ophthalmology;  Laterality: Right;  CDE: 4.12   CHOLECYSTECTOMY  09/29/2011   Procedure: LAPAROSCOPIC CHOLECYSTECTOMY;  Surgeon: Dalia Heading, MD;  Location: [facility] ORS;  Service: General;  Laterality: N/A;   COLONOSCOPY  08/2009   Dr. Elnoria Howard: sessile polyp (benign lymphoid), large hemorrhoids, repeat 5-10 years   COLONOSCOPY N/A 06/12/2016   prominent hemorrhoids   COLONOSCOPY WITH PROPOFOL N/A 06/16/2021   Procedure: COLONOSCOPY WITH PROPOFOL;  Surgeon: Corbin Ade, MD;  Location: [facility];  Service: Endoscopy;  Laterality: N/A;  9:30am (dialysis pt)   ESOPHAGOGASTRODUODENOSCOPY  09/05/2011   [id number] hiatal hernia; remainder of exam normal. No explanation for patient's abdominal pain with today's examination   ESOPHAGOGASTRODUODENOSCOPY N/A 12/17/2013   Dr. Jena Gauss: gastric erythema, erosion, mild chronic inflammation on path    ESOPHAGOGASTRODUODENOSCOPY (EGD) WITH PROPOFOL N/A 06/16/2021   Procedure: ESOPHAGOGASTRODUODENOSCOPY (EGD) WITH PROPOFOL;  Surgeon: Corbin Ade, MD;  Location: [facility];  Service: Endoscopy;  Laterality: N/A;   EXCISION OF BREAST BIOPSY Right 10/12/2020   Procedure: EXCISION OF RIGHT BREAST BIOPSY;  Surgeon: Franky Macho, MD;  Location: [facility];  Service: General;  Laterality: Right;   IR DIALY SHUNT INTRO NEEDLE/INTRACATH INITIAL W/IMG LEFT Left  07/17/2023   LAPAROSCOPIC APPENDECTOMY  09/29/2011   Procedure: APPENDECTOMY LAPAROSCOPIC;  Surgeon: Dalia Heading, MD;  Location: [facility];  Service: General;;  incidental appendectomy   LEFT HEART CATH AND CORONARY ANGIOGRAPHY N/A 02/28/2019   Procedure: LEFT HEART CATH AND CORONARY ANGIOGRAPHY;  Surgeon: Corky Crafts, MD;  Location: [facility] INVASIVE CV LAB;  Service: Cardiovascular;  Laterality: N/A;   MASS EXCISION Right 01/18/2023   Procedure: EXCISION MASS, RIGHT CHEST S/P MASTECTOMY;  Surgeon: Franky Macho, MD;  Location: [facility];  Service: General;  Laterality: Right;   MASTECTOMY MODIFIED RADICAL Right 02/18/2020   Procedure: MASTECTOMY MODIFIED RADICAL;  Surgeon: Franky Macho, MD;  Location: [facility];  Service: General;  Laterality: Right;   MASTECTOMY, PARTIAL Right 07/13/2020   Procedure: RIGHT PARTIAL MASTECTOMY;  Surgeon: Franky Macho, MD;  Location: [facility];  Service: General;  Laterality: Right;   PARTIAL MASTECTOMY WITH NEEDLE LOCALIZATION AND AXILLARY SENTINEL LYMPH NODE BX Right 09/18/2018   Procedure: RIGHT PARTIAL MASTECTOMY AFTER NEEDLE LOCALIZATION, SENTINEL LYMPH NODE BIOPSY RIGHT AXILLA;  Surgeon: Franky Macho, MD;  Location: [facility];  Service: General;  Laterality: Right;   POLYPECTOMY  06/16/2021   Procedure: POLYPECTOMY;  Surgeon: Corbin Ade, MD;  Location: [facility];  Service: Endoscopy;;   PORTACATH PLACEMENT Right 06/07/2023   Procedure: INSERTION PORT-A-CATH, RIGHT  (DIALYSIS ACCESS ON LEFT);  Surgeon: Franky Macho, MD;  Location: [facility];  Service: General;  Laterality: Right;   SIMPLE MASTECTOMY WITH AXILLARY SENTINEL NODE BIOPSY Left 06/15/2020   Procedure: LEFT SIMPLE MASTECTOMY;  Surgeon: Franky Macho, MD;  Location: [facility];  Service: General;  Laterality: Left;    Social History: Social History   Socioeconomic History   Marital status: Married    Spouse name: Not on file   Number of children: Not on file   Years of education: Not on file    Highest education level: 12th grade  Occupational History   Occupation: Systems developer: [employer]  Tobacco Use   Smoking status: Never    Passive exposure: Never   Smokeless tobacco: Never  Vaping Use   Vaping status: Never Used  Substance and Sexual Activity   Alcohol use: No   Drug use: No  Sexual activity: Yes    Birth control/protection: Surgical  Other Topics Concern   Not on file  Social History Narrative   Works at [employer] in [place].    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   Also has to get items from high shelves-causes achy pain in shoulder area      Married.   Children are grown, out of house.   Social Drivers of Corporate investment banker Strain: Low Risk  (09/20/2023)   Overall Financial Resource Strain (CARDIA)    Difficulty of Paying Living Expenses: Not hard at all  Food Insecurity: No Food Insecurity (09/20/2023)   Hunger Vital Sign    Worried About Running Out of Food in the Last Year: Never true    Ran Out of Food in the Last Year: Never true  Transportation Needs: No Transportation Needs (09/20/2023)   PRAPARE - Administrator, Civil Service (Medical): No    Lack of Transportation (Non-Medical): No  Physical Activity: Inactive (09/20/2023)   Exercise Vital Sign    Days of Exercise per Week: 0 days    Minutes of Exercise per Session: 0 min  Stress: No Stress Concern Present (09/20/2023)   [employer] of Occupational Health - Occupational Stress Questionnaire    Feeling of Stress : Not at all  Social Connections: Moderately Integrated (09/20/2023)   Social Connection and Isolation Panel [NHANES]    Frequency of Communication with Friends and Family: More than three times a week    Frequency of Social Gatherings with Friends and Family: Three times a week    Attends Religious Services: More than 4 times per year    Active Member of Clubs or Organizations: No    Attends Banker Meetings: Never     Marital Status: Married  Catering manager Violence: Not At Risk (09/20/2023)   Humiliation, Afraid, Rape, and Kick questionnaire    Fear of Current or Ex-Partner: No    Emotionally Abused: No    Physically Abused: No    Sexually Abused: No    Family History: Family History  Problem Relation Age of Onset   Hypertension Mother    Coronary artery disease Mother    Diabetes Mother    Hypertension Sister    Coronary artery disease Sister    Hypertension Brother    Heart attack Father    Hypertension Son    Heart attack Maternal Aunt    Hypertension Maternal Aunt    Diabetes Maternal Aunt    Heart attack Maternal Uncle    Hypertension Maternal Uncle    Diabetes Maternal Uncle    Heart attack Paternal Aunt    Hypertension Paternal Aunt    Diabetes Paternal Aunt    Heart attack Paternal Uncle    Hypertension Paternal Uncle    Diabetes Paternal Uncle    Heart attack Maternal Grandmother    Heart attack Maternal Grandfather    Heart attack Paternal Grandmother    Heart attack Paternal Grandfather    Colon cancer Neg Hx     Current Medications:  Current Outpatient Medications:    acetaminophen (TYLENOL) 500 MG tablet, Take 500-1,000 mg by mouth every 6 (six) hours as needed (pain.)., Disp: , Rfl:    amLODipine (NORVASC) 10 MG tablet, TAKE 1 TABLET BY MOUTH AT BEDTIME, Disp: 90 tablet, Rfl: 3   anastrozole (ARIMIDEX) 1 MG tablet, TAKE 1 TABLET BY MOUTH DAILY, Disp: 90 tablet, Rfl: 3  aspirin EC 81 MG tablet, Take 81 mg by mouth in the morning., Disp: , Rfl:    atorvastatin (LIPITOR) 20 MG tablet, TAKE 1 TABLET(20 MG) BY MOUTH DAILY, Disp: 90 tablet, Rfl: 0   B Complex-C-Zn-Folic Acid (DIALYVITE 800-ZINC 15) 0.8 MG TABS, Take 1 tablet by mouth in the morning., Disp: , Rfl:    cephALEXin (KEFLEX) 500 MG capsule, Take 1 capsule every 6 hours by oral route for 10 days., Disp: , Rfl:    cinacalcet (SENSIPAR) 30 MG tablet, Take 2 tablets (60 mg total) by mouth daily. (Patient taking  differently: Take 90 mg by mouth daily with breakfast. With a meal), Disp: 90 tablet, Rfl: 0   Darbepoetin Alfa (ARANESP, ALBUMIN FREE, IJ), Darbepoetin Alfa (Aranesp), Disp: , Rfl:    fluticasone (FLONASE) 50 MCG/ACT nasal spray, Place 2 sprays into both nostrils daily., Disp: 16 g, Rfl: 6   gabapentin (NEURONTIN) 300 MG capsule, TAKE 1 CAPSULE(300 MG) BY MOUTH AT BEDTIME, Disp: 90 capsule, Rfl: 3   glipiZIDE (GLUCOTROL XL) 5 MG 24 hr tablet, Take 1 tablet by mouth daily., Disp: , Rfl:    hydrALAZINE (APRESOLINE) 25 MG tablet, Take by mouth., Disp: , Rfl:    iron sucrose (VENOFER) 20 MG/ML injection, Inject 50 mg into the vein once a week., Disp: , Rfl:    lactulose (CHRONULAC) 10 GM/15ML solution, Take by mouth., Disp: , Rfl:    lanthanum (FOSRENOL) 1000 MG chewable tablet, Chew 2,000-3,000 mg by mouth See admin instructions. Take 3 tablets (3000 mg) by mouth with meals and take 2 tablets (2000 mg) with snacks, Disp: , Rfl:    lidocaine-prilocaine (EMLA) cream, Apply 1 application  topically every Monday, Wednesday, and Friday with hemodialysis., Disp: , Rfl:    losartan (COZAAR) 100 MG tablet, Take by mouth., Disp: , Rfl:    meclizine (ANTIVERT) 25 MG tablet, Take 1 tablet (25 mg total) by mouth 3 (three) times daily as needed for dizziness., Disp: 30 tablet, Rfl: 1   nebivolol (BYSTOLIC) 10 MG tablet, TAKE 1 TABLET BY MOUTH IN THE MORNING AND AT BEDTIME, Disp: 180 tablet, Rfl: 0   nitroGLYCERIN (NITROSTAT) 0.4 MG SL tablet, Place 1 tablet (0.4 mg total) under the tongue every 5 (five) minutes x 3 doses as needed for chest pain (if no relief after 3rd dose, proceed to ED or call 911)., Disp: 25 tablet, Rfl: 3   ondansetron (ZOFRAN) 4 MG tablet, Take 2 tablets (8 mg total) by mouth every 8 (eight) hours as needed for nausea or vomiting., Disp: 90 tablet, Rfl: 0   oxyCODONE (ROXICODONE) 5 MG immediate release tablet, Take 1 tablet (5 mg total) by mouth every 6 (six) hours as needed., Disp: 30 tablet,  Rfl: 0   pantoprazole (PROTONIX) 40 MG tablet, TAKE 1 TABLET(40 MG) BY MOUTH DAILY, Disp: 30 tablet, Rfl: 1   prochlorperazine (COMPAZINE) 10 MG tablet, Take 1 tablet (10 mg total) by mouth every 6 (six) hours as needed for nausea or vomiting., Disp: 60 tablet, Rfl: 3   sucralfate (CARAFATE) 1 g tablet, Take 1 tablet (1 g total) by mouth 4 (four) times daily -  before meals and at bedtime. Crush in small amount of water and mix., Disp: 120 tablet, Rfl: 1   VITAMIN D PO, Take 0.25 mcg by mouth daily., Disp: , Rfl:  No current facility-administered medications for this visit.  Facility-Administered Medications Ordered in Other Visits:    0.9 %  sodium chloride infusion (Manually program via Guardrails  IV Fluids), 250 mL, Intravenous, Continuous, [name], MD   0.9 %  sodium chloride infusion, , Intravenous, Continuous, [name], MD, Last Rate: 10 mL/hr at 09/27/23 0958, New Bag at 09/27/23 0958   CARBOplatin (PARAPLATIN) 160 mg in sodium chloride 0.9 % 100 mL chemo infusion, 160 mg, Intravenous, Once, [name], MD, Last Rate: 232 mL/hr at 09/27/23 1309, 160 mg at 09/27/23 1309   heparin lock flush 100 unit/mL, 500 Units, Intracatheter, Once PRN, [name], MD   sodium chloride flush (NS) 0.9 % injection 10 mL, 10 mL, Intracatheter, PRN, [name], MD   Allergies: Allergies  Allergen Reactions   Ibuprofen Other (See Comments)    esrd  Other Reaction(s): Other (See Comments)    esrd    REVIEW OF SYSTEMS:   Review of Systems  Constitutional:  Negative for chills, fatigue and fever.  HENT:   Negative for lump/mass, mouth sores, nosebleeds, sore throat and trouble swallowing.   Eyes:  Negative for eye problems.  Respiratory:  Negative for cough and shortness of breath.   Cardiovascular:  Negative for chest pain, leg swelling and palpitations.  Gastrointestinal:  Negative for abdominal pain, constipation, diarrhea, nausea and  vomiting.  Genitourinary:  Negative for bladder incontinence, difficulty urinating, dysuria, frequency, hematuria and nocturia.   Musculoskeletal:  Negative for arthralgias, back pain, flank pain, myalgias and neck pain.  Skin:  Negative for itching and rash.  Neurological:  Positive for dizziness and numbness (in feet, hands, and fingers). Negative for headaches.  Hematological:  Does not bruise/bleed easily.  Psychiatric/Behavioral:  Negative for depression, sleep disturbance and suicidal ideas. The patient is not nervous/anxious.   All other systems reviewed and are negative.    VITALS:   Blood pressure 115/62, pulse 88, temperature 97.6 F (36.4 C), temperature source Tympanic, resp. rate 18, height 5' 1.5" (1.562 m), weight 177 lb (80.3 kg), SpO2 100%.  Wt Readings from Last 3 Encounters:  09/27/23 177 lb (80.3 kg)  09/18/23 175 lb 6.4 oz (79.6 kg)  09/06/23 177 lb 14.4 oz (80.7 kg)    Body mass index is 32.9 kg/m.  Performance status (ECOG): 1 - Symptomatic but completely ambulatory  PHYSICAL EXAM:   Physical Exam Vitals and nursing note reviewed. Exam conducted with a chaperone present.  Constitutional:      Appearance: Normal appearance.  Cardiovascular:     Rate and Rhythm: Normal rate and regular rhythm.     Pulses: Normal pulses.     Heart sounds: Normal heart sounds.  Pulmonary:     Effort: Pulmonary effort is normal.     Breath sounds: Normal breath sounds.  Abdominal:     Palpations: Abdomen is soft. There is no hepatomegaly, splenomegaly or mass.     Tenderness: There is no abdominal tenderness.  Musculoskeletal:     Right lower leg: No edema.     Left lower leg: No edema.  Lymphadenopathy:     Cervical: No cervical adenopathy.     Right cervical: No superficial, deep or posterior cervical adenopathy.    Left cervical: No superficial, deep or posterior cervical adenopathy.     Upper Body:     Right upper body: No supraclavicular or axillary adenopathy.      Left upper body: No supraclavicular or axillary adenopathy.  Neurological:     General: No focal deficit present.     Mental Status: She is alert and oriented to person, place, and time.  Psychiatric:  Mood and Affect: Mood normal.        Behavior: Behavior normal.     LABS:      Latest Ref Rng & Units 09/27/2023    8:11 AM 09/06/2023    8:07 AM 08/16/2023    8:08 AM  CBC  WBC 4.0 - 10.5 K/uL 7.0  6.6  7.0   Hemoglobin 12.0 - 15.0 g/dL 7.9  7.7  8.3   Hematocrit 36.0 - 46.0 % 24.5  24.3  25.8   Platelets 150 - 400 K/uL 187  179  176       Latest Ref Rng & Units 09/27/2023    8:11 AM 09/06/2023    8:07 AM 08/16/2023    8:08 AM  CMP  Glucose 70 - 99 mg/dL 161  096  045   BUN 8 - 23 mg/dL 27  24  51   Creatinine 0.44 - 1.00 mg/dL 4.09  8.11  91.47   Sodium 135 - 145 mmol/L 137  138  137   Potassium 3.5 - 5.1 mmol/L 3.2  3.3  3.4   Chloride 98 - 111 mmol/L 98  97  98   CO2 22 - 32 mmol/L 29  27  26    Calcium 8.9 - 10.3 mg/dL 9.5  8.6  8.5   Total Protein 6.5 - 8.1 g/dL 7.0  6.8  6.8   Total Bilirubin 0.0 - 1.2 mg/dL 0.7  0.6  0.7   Alkaline Phos 38 - 126 U/L 146  149  161   AST 15 - 41 U/L 21  21  18    ALT 0 - 44 U/L 18  17  21       No results found for: "CEA1", "CEA" / No results found for: "CEA1", "CEA" No results found for: "PSA1" No results found for: "WGN562" No results found for: "CAN125"  No results found for: "TOTALPROTELP", "ALBUMINELP", "A1GS", "A2GS", "BETS", "BETA2SER", "GAMS", "MSPIKE", "SPEI" Lab Results  Component Value Date   TIBC 195 (L) 02/15/2021   TIBC 190 (L) 12/10/2018   TIBC 218 (L) 06/08/2016   FERRITIN 1,742 (H) 02/15/2021   FERRITIN 1,308 (H) 12/10/2018   FERRITIN 126 06/08/2016   IRONPCTSAT 15 02/15/2021   IRONPCTSAT 43 (H) 12/10/2018   IRONPCTSAT 22 06/08/2016   No results found for: "LDH"   STUDIES:   No results found.

## 2023-09-27 ENCOUNTER — Inpatient Hospital Stay: Payer: Medicare Other | Attending: Hematology

## 2023-09-27 ENCOUNTER — Inpatient Hospital Stay: Payer: Medicare Other

## 2023-09-27 ENCOUNTER — Inpatient Hospital Stay (HOSPITAL_BASED_OUTPATIENT_CLINIC_OR_DEPARTMENT_OTHER): Payer: Medicare Other | Admitting: Hematology

## 2023-09-27 VITALS — BP 115/62 | HR 88 | Temp 97.6°F | Resp 18 | Ht 61.5 in | Wt 177.0 lb

## 2023-09-27 VITALS — BP 151/75 | HR 89 | Temp 97.6°F | Resp 18

## 2023-09-27 DIAGNOSIS — Z9223 Personal history of estrogen therapy: Secondary | ICD-10-CM | POA: Diagnosis not present

## 2023-09-27 DIAGNOSIS — E1122 Type 2 diabetes mellitus with diabetic chronic kidney disease: Secondary | ICD-10-CM | POA: Insufficient documentation

## 2023-09-27 DIAGNOSIS — C50911 Malignant neoplasm of unspecified site of right female breast: Secondary | ICD-10-CM | POA: Diagnosis not present

## 2023-09-27 DIAGNOSIS — E1140 Type 2 diabetes mellitus with diabetic neuropathy, unspecified: Secondary | ICD-10-CM | POA: Diagnosis not present

## 2023-09-27 DIAGNOSIS — Z170 Estrogen receptor positive status [ER+]: Secondary | ICD-10-CM | POA: Diagnosis not present

## 2023-09-27 DIAGNOSIS — K219 Gastro-esophageal reflux disease without esophagitis: Secondary | ICD-10-CM | POA: Insufficient documentation

## 2023-09-27 DIAGNOSIS — Z9013 Acquired absence of bilateral breasts and nipples: Secondary | ICD-10-CM | POA: Diagnosis not present

## 2023-09-27 DIAGNOSIS — N186 End stage renal disease: Secondary | ICD-10-CM | POA: Insufficient documentation

## 2023-09-27 DIAGNOSIS — R0789 Other chest pain: Secondary | ICD-10-CM | POA: Insufficient documentation

## 2023-09-27 DIAGNOSIS — Z5189 Encounter for other specified aftercare: Secondary | ICD-10-CM | POA: Insufficient documentation

## 2023-09-27 DIAGNOSIS — E782 Mixed hyperlipidemia: Secondary | ICD-10-CM | POA: Insufficient documentation

## 2023-09-27 DIAGNOSIS — Z79811 Long term (current) use of aromatase inhibitors: Secondary | ICD-10-CM | POA: Insufficient documentation

## 2023-09-27 DIAGNOSIS — E876 Hypokalemia: Secondary | ICD-10-CM

## 2023-09-27 DIAGNOSIS — I5032 Chronic diastolic (congestive) heart failure: Secondary | ICD-10-CM | POA: Diagnosis not present

## 2023-09-27 DIAGNOSIS — K6289 Other specified diseases of anus and rectum: Secondary | ICD-10-CM | POA: Diagnosis not present

## 2023-09-27 DIAGNOSIS — C799 Secondary malignant neoplasm of unspecified site: Secondary | ICD-10-CM

## 2023-09-27 DIAGNOSIS — Z7984 Long term (current) use of oral hypoglycemic drugs: Secondary | ICD-10-CM | POA: Insufficient documentation

## 2023-09-27 DIAGNOSIS — M8580 Other specified disorders of bone density and structure, unspecified site: Secondary | ICD-10-CM | POA: Diagnosis not present

## 2023-09-27 DIAGNOSIS — Z992 Dependence on renal dialysis: Secondary | ICD-10-CM | POA: Insufficient documentation

## 2023-09-27 DIAGNOSIS — I132 Hypertensive heart and chronic kidney disease with heart failure and with stage 5 chronic kidney disease, or end stage renal disease: Secondary | ICD-10-CM | POA: Diagnosis not present

## 2023-09-27 DIAGNOSIS — Z5111 Encounter for antineoplastic chemotherapy: Secondary | ICD-10-CM | POA: Diagnosis not present

## 2023-09-27 DIAGNOSIS — C50211 Malignant neoplasm of upper-inner quadrant of right female breast: Secondary | ICD-10-CM | POA: Insufficient documentation

## 2023-09-27 LAB — COMPREHENSIVE METABOLIC PANEL
ALT: 18 U/L (ref 0–44)
AST: 21 U/L (ref 15–41)
Albumin: 3.3 g/dL — ABNORMAL LOW (ref 3.5–5.0)
Alkaline Phosphatase: 146 U/L — ABNORMAL HIGH (ref 38–126)
Anion gap: 10 (ref 5–15)
BUN: 27 mg/dL — ABNORMAL HIGH (ref 8–23)
CO2: 29 mmol/L (ref 22–32)
Calcium: 9.5 mg/dL (ref 8.9–10.3)
Chloride: 98 mmol/L (ref 98–111)
Creatinine, Ser: 8.85 mg/dL — ABNORMAL HIGH (ref 0.44–1.00)
GFR, Estimated: 5 mL/min — ABNORMAL LOW (ref >60)
Glucose, Bld: 126 mg/dL — ABNORMAL HIGH (ref 70–99)
Potassium: 3.2 mmol/L — ABNORMAL LOW (ref 3.5–5.1)
Sodium: 137 mmol/L (ref 135–145)
Total Bilirubin: 0.7 mg/dL (ref 0.0–1.2)
Total Protein: 7 g/dL (ref 6.5–8.1)

## 2023-09-27 LAB — CBC WITH DIFFERENTIAL/PLATELET
Abs Immature Granulocytes: 0.03 10*3/uL (ref 0.00–0.07)
Basophils Absolute: 0.1 10*3/uL (ref 0.0–0.1)
Basophils Relative: 1 %
Eosinophils Absolute: 0.3 10*3/uL (ref 0.0–0.5)
Eosinophils Relative: 4 %
HCT: 24.5 % — ABNORMAL LOW (ref 36.0–46.0)
Hemoglobin: 7.9 g/dL — ABNORMAL LOW (ref 12.0–15.0)
Immature Granulocytes: 0 %
Lymphocytes Relative: 13 %
Lymphs Abs: 0.9 10*3/uL (ref 0.7–4.0)
MCH: 36.6 pg — ABNORMAL HIGH (ref 26.0–34.0)
MCHC: 32.2 g/dL (ref 30.0–36.0)
MCV: 113.4 fL — ABNORMAL HIGH (ref 80.0–100.0)
Monocytes Absolute: 0.9 10*3/uL (ref 0.1–1.0)
Monocytes Relative: 13 %
Neutro Abs: 4.8 10*3/uL (ref 1.7–7.7)
Neutrophils Relative %: 69 %
Platelets: 187 10*3/uL (ref 150–400)
RBC: 2.16 MIL/uL — ABNORMAL LOW (ref 3.87–5.11)
RDW: 22.6 % — ABNORMAL HIGH (ref 11.5–15.5)
Smear Review: ADEQUATE
WBC: 7 10*3/uL (ref 4.0–10.5)
nRBC: 0.3 % — ABNORMAL HIGH (ref 0.0–0.2)

## 2023-09-27 LAB — PREPARE RBC (CROSSMATCH)

## 2023-09-27 LAB — MAGNESIUM: Magnesium: 2.1 mg/dL (ref 1.7–2.4)

## 2023-09-27 LAB — SAMPLE TO BLOOD BANK

## 2023-09-27 MED ORDER — ACETAMINOPHEN 325 MG PO TABS
650.0000 mg | ORAL_TABLET | Freq: Once | ORAL | Status: AC
  Administered 2023-09-27: 650 mg via ORAL
  Filled 2023-09-27: qty 2

## 2023-09-27 MED ORDER — SODIUM CHLORIDE 0.9 % IV SOLN
200.0000 mg | Freq: Once | INTRAVENOUS | Status: AC
  Administered 2023-09-27: 200 mg via INTRAVENOUS
  Filled 2023-09-27: qty 8

## 2023-09-27 MED ORDER — SODIUM CHLORIDE 0.9% IV SOLUTION
250.0000 mL | INTRAVENOUS | Status: DC
Start: 2023-09-27 — End: 2023-09-27
  Administered 2023-09-27: 250 mL via INTRAVENOUS

## 2023-09-27 MED ORDER — PALONOSETRON HCL INJECTION 0.25 MG/5ML
0.2500 mg | Freq: Once | INTRAVENOUS | Status: AC
  Administered 2023-09-27: 0.25 mg via INTRAVENOUS
  Filled 2023-09-27: qty 5

## 2023-09-27 MED ORDER — SODIUM CHLORIDE 0.9 % IV SOLN: INTRAVENOUS | Status: DC

## 2023-09-27 MED ORDER — DEXAMETHASONE SODIUM PHOSPHATE 10 MG/ML IJ SOLN
10.0000 mg | Freq: Once | INTRAMUSCULAR | Status: AC
  Administered 2023-09-27: 10 mg via INTRAVENOUS
  Filled 2023-09-27: qty 1

## 2023-09-27 MED ORDER — SODIUM CHLORIDE 0.9% FLUSH
10.0000 mL | INTRAVENOUS | Status: DC | PRN
  Administered 2023-09-27: 10 mL

## 2023-09-27 MED ORDER — HEPARIN SOD (PORK) LOCK FLUSH 100 UNIT/ML IV SOLN
500.0000 [IU] | Freq: Once | INTRAVENOUS | Status: AC | PRN
  Administered 2023-09-27: 500 [IU]

## 2023-09-27 MED ORDER — SODIUM CHLORIDE 0.9 % IV SOLN
60.0000 mg/m2 | Freq: Once | INTRAVENOUS | Status: AC
  Administered 2023-09-27: 112 mg via INTRAVENOUS
  Filled 2023-09-27: qty 11.2

## 2023-09-27 MED ORDER — DIPHENHYDRAMINE HCL 25 MG PO CAPS
25.0000 mg | ORAL_CAPSULE | Freq: Once | ORAL | Status: AC
  Administered 2023-09-27: 25 mg via ORAL
  Filled 2023-09-27: qty 1

## 2023-09-27 MED ORDER — SODIUM CHLORIDE 0.9 % IV SOLN
160.0000 mg | Freq: Once | INTRAVENOUS | Status: AC
  Administered 2023-09-27: 160 mg via INTRAVENOUS
  Filled 2023-09-27: qty 16

## 2023-09-27 MED ORDER — POTASSIUM CHLORIDE CRYS ER 20 MEQ PO TBCR
40.0000 meq | EXTENDED_RELEASE_TABLET | Freq: Once | ORAL | Status: AC
  Administered 2023-09-27: 40 meq via ORAL
  Filled 2023-09-27: qty 2

## 2023-09-27 MED ORDER — FOSAPREPITANT DIMEGLUMINE INJECTION 150 MG
150.0000 mg | Freq: Once | INTRAVENOUS | Status: AC
  Administered 2023-09-27: 150 mg via INTRAVENOUS
  Filled 2023-09-27: qty 150

## 2023-09-27 NOTE — Patient Instructions (Signed)
[facility] at [facility] Discharge Instructions   You were seen and examined today by Dr. Ellin Saba.  He reviewed the results of your lab work which are normal/stable.   We will proceed with your treatment today.   We will see you back in 6 weeks. We will repeat a CT scan prior to this visit.   Return as scheduled.    Thank you for choosing [facility] at [facility] to provide your oncology and hematology care.  To afford each patient quality time with our provider, please arrive at least 15 minutes before your scheduled appointment time.   If you have a lab appointment with the Cancer Center please come in thru the Main Entrance and check in at the main information desk.  You need to re-schedule your appointment should you arrive 10 or more minutes late.  We strive to give you quality time with our providers, and arriving late affects you and other patients whose appointments are after yours.  Also, if you no show three or more times for appointments you may be dismissed from the clinic at the providers discretion.     Again, thank you for choosing [facility].  Our hope is that these requests will decrease the amount of time that you wait before being seen by our physicians.       _____________________________________________________________  Should you have questions after your visit to [facility], please contact our office at [phone] and follow the prompts.  Our office hours are 8:00 a.m. and 4:30 p.m. Monday - Friday.  Please note that voicemails left after 4:00 p.m. may not be returned until the following business day.  We are closed weekends and major holidays.  You do have access to a nurse 24-7, just call the main number to the clinic [phone] and do not press any options, hold on the line and a nurse will answer the phone.    For prescription refill requests, have your pharmacy contact our office  and allow 72 hours.    Due to Covid, you will need to wear a mask upon entering the hospital. If you do not have a mask, a mask will be given to you at the Main Entrance upon arrival. For doctor visits, patients may have 1 support person age 62 or older with them. For treatment visits, patients can not have anyone with them due to social distancing guidelines and our immunocompromised population.

## 2023-09-27 NOTE — Progress Notes (Signed)
Patient has been examined by Dr. Ellin Saba. Vital signs and labs have been reviewed by MD - ANC, Creatinine, LFTs, hemoglobin (7.9), and platelets are within treatment parameters per M.D. - pt may proceed with treatment.  Primary RN and pharmacy notified.

## 2023-09-27 NOTE — Patient Instructions (Signed)
[facility] [place] - A DEPT OF [facility]  Discharge Instructions: Thank you for choosing [facility] to provide your oncology and hematology care.  If you have a lab appointment with the Cancer Center - please note that after April 8th, 2024, all labs will be drawn in the cancer center.  You do not have to check in or register with the main entrance as you have in the past but will complete your check-in in the cancer center.  Wear comfortable clothing and clothing appropriate for easy access to any Portacath or PICC line.   We strive to give you quality time with your provider. You may need to reschedule your appointment if you arrive late (15 or more minutes).  Arriving late affects you and other patients whose appointments are after yours.  Also, if you miss three or more appointments without notifying the office, you may be dismissed from the clinic at the provider's discretion.      For prescription refill requests, have your pharmacy contact our office and allow 72 hours for refills to be completed.    Today you received the following chemotherapy and/or immunotherapy agents Keytruda, Docetaxel and Carboplatin, 1 unit of blood, return as scheduled.   To help prevent nausea and vomiting after your treatment, we encourage you to take your nausea medication as directed.  BELOW ARE SYMPTOMS THAT SHOULD BE REPORTED IMMEDIATELY: *FEVER GREATER THAN 100.4 F (38 C) OR HIGHER *CHILLS OR SWEATING *NAUSEA AND VOMITING THAT IS NOT CONTROLLED WITH YOUR NAUSEA MEDICATION *UNUSUAL SHORTNESS OF BREATH *UNUSUAL BRUISING OR BLEEDING *URINARY PROBLEMS (pain or burning when urinating, or frequent urination) *BOWEL PROBLEMS (unusual diarrhea, constipation, pain near the anus) TENDERNESS IN MOUTH AND THROAT WITH OR WITHOUT PRESENCE OF ULCERS (sore throat, sores in mouth, or a toothache) UNUSUAL RASH, SWELLING OR PAIN  UNUSUAL VAGINAL DISCHARGE OR ITCHING   Items  with * indicate a potential emergency and should be followed up as soon as possible or go to the Emergency Department if any problems should occur.  Please show the CHEMOTHERAPY ALERT CARD or IMMUNOTHERAPY ALERT CARD at check-in to the Emergency Department and triage nurse.  Should you have questions after your visit or need to cancel or reschedule your appointment, please contact [facility] [place] - A DEPT OF Eligha Bridegroom [facility] [phone]  and follow the prompts.  Office hours are 8:00 a.m. to 4:30 p.m. Monday - Friday. Please note that voicemails left after 4:00 p.m. may not be returned until the following business day.  We are closed weekends and major holidays. You have access to a nurse at all times for urgent questions. Please call the main number to the clinic [phone] and follow the prompts.  For any non-urgent questions, you may also contact your provider using MyChart. We now offer e-Visits for anyone 69 and older to request care online for non-urgent symptoms. For details visit [email or web address].   Also download the MyChart app! Go to the app store, search "MyChart", open the app, select [place], and log in with your MyChart username and password.

## 2023-09-27 NOTE — Progress Notes (Signed)
Patient's hemoglobin 7.9 and potassium 3.2 okay for treatment today per Dr. Ellin Saba with additional orders for 1 unit of blood.  Patient tolerated chemotherapy and 1 unit of blood with no complaints voiced. Side effects with management reviewed understanding verbalized. Port site clean and dry with no bruising or swelling noted at site. Good blood return noted before and after administration of chemotherapy. Band aid applied. Patient left in satisfactory condition with VSS and no s/s of distress noted.

## 2023-09-28 ENCOUNTER — Inpatient Hospital Stay: Payer: Medicare Other

## 2023-09-28 VITALS — BP 146/68 | HR 95 | Temp 97.0°F | Resp 18

## 2023-09-28 DIAGNOSIS — C50211 Malignant neoplasm of upper-inner quadrant of right female breast: Secondary | ICD-10-CM | POA: Diagnosis not present

## 2023-09-28 DIAGNOSIS — E1129 Type 2 diabetes mellitus with other diabetic kidney complication: Secondary | ICD-10-CM | POA: Diagnosis not present

## 2023-09-28 DIAGNOSIS — D631 Anemia in chronic kidney disease: Secondary | ICD-10-CM | POA: Diagnosis not present

## 2023-09-28 DIAGNOSIS — Z992 Dependence on renal dialysis: Secondary | ICD-10-CM | POA: Diagnosis not present

## 2023-09-28 DIAGNOSIS — N2581 Secondary hyperparathyroidism of renal origin: Secondary | ICD-10-CM | POA: Diagnosis not present

## 2023-09-28 DIAGNOSIS — C50911 Malignant neoplasm of unspecified site of right female breast: Secondary | ICD-10-CM

## 2023-09-28 DIAGNOSIS — Z170 Estrogen receptor positive status [ER+]: Secondary | ICD-10-CM | POA: Diagnosis not present

## 2023-09-28 DIAGNOSIS — N186 End stage renal disease: Secondary | ICD-10-CM | POA: Diagnosis not present

## 2023-09-28 DIAGNOSIS — Z5189 Encounter for other specified aftercare: Secondary | ICD-10-CM | POA: Diagnosis not present

## 2023-09-28 DIAGNOSIS — E119 Type 2 diabetes mellitus without complications: Secondary | ICD-10-CM | POA: Diagnosis not present

## 2023-09-28 DIAGNOSIS — Z79811 Long term (current) use of aromatase inhibitors: Secondary | ICD-10-CM | POA: Diagnosis not present

## 2023-09-28 DIAGNOSIS — Z5111 Encounter for antineoplastic chemotherapy: Secondary | ICD-10-CM | POA: Diagnosis not present

## 2023-09-28 DIAGNOSIS — M8580 Other specified disorders of bone density and structure, unspecified site: Secondary | ICD-10-CM | POA: Diagnosis not present

## 2023-09-28 LAB — TYPE AND SCREEN
ABO/RH(D): B POS
Antibody Screen: NEGATIVE
Unit division: 0
Unit division: 0

## 2023-09-28 LAB — BPAM RBC
Blood Product Expiration Date: 202503042359
Blood Product Expiration Date: 202503202359
ISSUE DATE / TIME: 202502131352
Unit Type and Rh: 1700
Unit Type and Rh: 5100

## 2023-09-28 MED ORDER — PEGFILGRASTIM-FPGK 6 MG/0.6ML ~~LOC~~ SOSY
6.0000 mg | PREFILLED_SYRINGE | Freq: Once | SUBCUTANEOUS | Status: AC
  Administered 2023-09-28: 6 mg via SUBCUTANEOUS
  Filled 2023-09-28: qty 0.6

## 2023-09-28 NOTE — Progress Notes (Signed)
Megan [name] presents today for Stimufend injection per the provider's orders.  Stable during administration without incident; injection site WNL; see MAR for injection details.  Patient tolerated procedure well and without incident.  No questions or complaints noted at this time.

## 2023-09-28 NOTE — Patient Instructions (Signed)
[facility] [place] - A DEPT OF [facility]  Discharge Instructions: Thank you for choosing [facility] to provide your oncology and hematology care.  If you have a lab appointment with the Cancer Center - please note that after April 8th, 2024, all labs will be drawn in the cancer center.  You do not have to check in or register with the main entrance as you have in the past but will complete your check-in in the cancer center.  Wear comfortable clothing and clothing appropriate for easy access to any Portacath or PICC line.   We strive to give you quality time with your provider. You may need to reschedule your appointment if you arrive late (15 or more minutes).  Arriving late affects you and other patients whose appointments are after yours.  Also, if you miss three or more appointments without notifying the office, you may be dismissed from the clinic at the provider's discretion.      For prescription refill requests, have your pharmacy contact our office and allow 72 hours for refills to be completed.    Today you received the following chemotherapy and/or immunotherapy agents Stimufend      To help prevent nausea and vomiting after your treatment, we encourage you to take your nausea medication as directed.  BELOW ARE SYMPTOMS THAT SHOULD BE REPORTED IMMEDIATELY: *FEVER GREATER THAN 100.4 F (38 C) OR HIGHER *CHILLS OR SWEATING *NAUSEA AND VOMITING THAT IS NOT CONTROLLED WITH YOUR NAUSEA MEDICATION *UNUSUAL SHORTNESS OF BREATH *UNUSUAL BRUISING OR BLEEDING *URINARY PROBLEMS (pain or burning when urinating, or frequent urination) *BOWEL PROBLEMS (unusual diarrhea, constipation, pain near the anus) TENDERNESS IN MOUTH AND THROAT WITH OR WITHOUT PRESENCE OF ULCERS (sore throat, sores in mouth, or a toothache) UNUSUAL RASH, SWELLING OR PAIN  UNUSUAL VAGINAL DISCHARGE OR ITCHING   Items with * indicate a potential emergency and should be followed up  as soon as possible or go to the Emergency Department if any problems should occur.  Please show the CHEMOTHERAPY ALERT CARD or IMMUNOTHERAPY ALERT CARD at check-in to the Emergency Department and triage nurse.  Should you have questions after your visit or need to cancel or reschedule your appointment, please contact [facility] [place] - A DEPT OF [facility]igh[facility] Bridegroom [facility] [phone]  and follow the prompts.  Office hours are 8:00 a.m. to 4:30 p.m. Monday - Friday. Please note that voicemails left after 4:00 p.m. may not be returned until the following business day.  We are closed weekends and major holidays. You have access to a nurse at all times for urgent questions. Please call the main number to the clinic [phone] and follow the prompts.  For any non-urgent questions, you may also contact your provider using MyChart. We now offer e-Visits for anyone 3 and older to request care online for non-urgent symptoms. For details visit [email or web address].   Also download the MyChart app! Go to the app store, search "MyChart", open the app, select [place], and log in with your MyChart username and password.

## 2023-09-29 ENCOUNTER — Other Ambulatory Visit: Payer: Self-pay

## 2023-09-29 DIAGNOSIS — Z992 Dependence on renal dialysis: Secondary | ICD-10-CM | POA: Diagnosis not present

## 2023-09-29 DIAGNOSIS — N2581 Secondary hyperparathyroidism of renal origin: Secondary | ICD-10-CM | POA: Diagnosis not present

## 2023-09-29 DIAGNOSIS — N186 End stage renal disease: Secondary | ICD-10-CM | POA: Diagnosis not present

## 2023-09-29 DIAGNOSIS — E1129 Type 2 diabetes mellitus with other diabetic kidney complication: Secondary | ICD-10-CM | POA: Diagnosis not present

## 2023-09-29 DIAGNOSIS — E119 Type 2 diabetes mellitus without complications: Secondary | ICD-10-CM | POA: Diagnosis not present

## 2023-09-29 DIAGNOSIS — D631 Anemia in chronic kidney disease: Secondary | ICD-10-CM | POA: Diagnosis not present

## 2023-10-01 ENCOUNTER — Telehealth: Payer: Self-pay | Admitting: Internal Medicine

## 2023-10-01 ENCOUNTER — Other Ambulatory Visit: Payer: Self-pay | Admitting: Hematology

## 2023-10-01 DIAGNOSIS — Z992 Dependence on renal dialysis: Secondary | ICD-10-CM | POA: Diagnosis not present

## 2023-10-01 DIAGNOSIS — D631 Anemia in chronic kidney disease: Secondary | ICD-10-CM | POA: Diagnosis not present

## 2023-10-01 DIAGNOSIS — N2581 Secondary hyperparathyroidism of renal origin: Secondary | ICD-10-CM | POA: Diagnosis not present

## 2023-10-01 DIAGNOSIS — E119 Type 2 diabetes mellitus without complications: Secondary | ICD-10-CM | POA: Diagnosis not present

## 2023-10-01 DIAGNOSIS — N186 End stage renal disease: Secondary | ICD-10-CM | POA: Diagnosis not present

## 2023-10-01 DIAGNOSIS — E1129 Type 2 diabetes mellitus with other diabetic kidney complication: Secondary | ICD-10-CM | POA: Diagnosis not present

## 2023-10-01 NOTE — Telephone Encounter (Signed)
Pt called to say her hemorrhoid was bleeding and she is scheduled an EGD with RMR on 2/20. She didn't know if this needed to be addressed prior. I made her an OV with AB for next Thursday and told her I would have the nurse to call. [phone]

## 2023-10-01 NOTE — Telephone Encounter (Signed)
Use rectal cream BID. No straining. Limit toilet time to 2-3 minutes. Call if any worsening. It can wait till I see her.

## 2023-10-01 NOTE — Telephone Encounter (Signed)
Megan [name], I phoned and spoke with the pt and was advised she is having bleeding with soft BM's (not a lot) but there is some there. She feels fine. Advised her until she hears back to start back to using her rectal creams. (Pt agreed).  Her question is to you do you need to see her before EGD with Dr Jena Gauss or can this wait and you tackle at her neck appt with you because she had not used the rectal cream. Please advise

## 2023-10-01 NOTE — Telephone Encounter (Signed)
Phoned and advised the pt of the instructions/ recommendations / and ok to address at appt time/ if things get worse to let us know. Pt expressed understanding

## 2023-10-02 ENCOUNTER — Telehealth: Payer: Self-pay

## 2023-10-02 ENCOUNTER — Other Ambulatory Visit: Payer: Self-pay

## 2023-10-02 DIAGNOSIS — K640 First degree hemorrhoids: Secondary | ICD-10-CM

## 2023-10-02 DIAGNOSIS — K625 Hemorrhage of anus and rectum: Secondary | ICD-10-CM

## 2023-10-02 NOTE — Telephone Encounter (Signed)
Megan [name] advised the pt of the above. She is  going to [facility] to have CBC done. Accepts the recommendations to go ahead with procedure

## 2023-10-02 NOTE — Telephone Encounter (Signed)
I don't have any spots to do that. Let's check a CBC today if she is able.

## 2023-10-02 NOTE — Telephone Encounter (Signed)
Megan [name]  I returned the pt's call and she states now that the bleeding has stopped (did advise ED if she was still bleeding). She states do you want to band her before her 2/20 EGD visit with Dr Jena Gauss. Please advise

## 2023-10-03 DIAGNOSIS — N186 End stage renal disease: Secondary | ICD-10-CM | POA: Diagnosis not present

## 2023-10-03 DIAGNOSIS — E119 Type 2 diabetes mellitus without complications: Secondary | ICD-10-CM | POA: Diagnosis not present

## 2023-10-03 DIAGNOSIS — E1129 Type 2 diabetes mellitus with other diabetic kidney complication: Secondary | ICD-10-CM | POA: Diagnosis not present

## 2023-10-03 DIAGNOSIS — E039 Hypothyroidism, unspecified: Secondary | ICD-10-CM | POA: Diagnosis not present

## 2023-10-03 DIAGNOSIS — D631 Anemia in chronic kidney disease: Secondary | ICD-10-CM | POA: Diagnosis not present

## 2023-10-03 DIAGNOSIS — Z992 Dependence on renal dialysis: Secondary | ICD-10-CM | POA: Diagnosis not present

## 2023-10-03 DIAGNOSIS — N2581 Secondary hyperparathyroidism of renal origin: Secondary | ICD-10-CM | POA: Diagnosis not present

## 2023-10-03 LAB — CBC WITH DIFFERENTIAL/PLATELET
Absolute Lymphocytes: 688 {cells}/uL — ABNORMAL LOW (ref 850–3900)
Absolute Monocytes: 241 {cells}/uL (ref 200–950)
Basophils Absolute: 82 {cells}/uL (ref 0–200)
Basophils Relative: 1.9 %
Eosinophils Absolute: 133 {cells}/uL (ref 15–500)
Eosinophils Relative: 3.1 %
HCT: 25.8 % — ABNORMAL LOW (ref 35.0–45.0)
Hemoglobin: 8.6 g/dL — ABNORMAL LOW (ref 11.7–15.5)
MCH: 33.7 pg — ABNORMAL HIGH (ref 27.0–33.0)
MCHC: 33.3 g/dL (ref 32.0–36.0)
MCV: 101.2 fL — ABNORMAL HIGH (ref 80.0–100.0)
MPV: 12.7 fL — ABNORMAL HIGH (ref 7.5–12.5)
Monocytes Relative: 5.6 %
Neutro Abs: 3156 {cells}/uL (ref 1500–7800)
Neutrophils Relative %: 73.4 %
Platelets: 95 10*3/uL — ABNORMAL LOW (ref 140–400)
RBC: 2.55 10*6/uL — ABNORMAL LOW (ref 3.80–5.10)
RDW: 22.6 % — ABNORMAL HIGH (ref 11.0–15.0)
Total Lymphocyte: 16 %
WBC: 4.3 10*3/uL (ref 3.8–10.8)

## 2023-10-04 ENCOUNTER — Ambulatory Visit (HOSPITAL_COMMUNITY)
Admission: RE | Admit: 2023-10-04 | Discharge: 2023-10-04 | Disposition: A | Discharge status: Home / Self Care | Payer: Medicare Other | Attending: Internal Medicine | Admitting: Internal Medicine

## 2023-10-04 ENCOUNTER — Other Ambulatory Visit: Payer: Self-pay

## 2023-10-04 ENCOUNTER — Encounter (HOSPITAL_COMMUNITY): Payer: Self-pay | Admitting: Internal Medicine

## 2023-10-04 ENCOUNTER — Encounter (HOSPITAL_COMMUNITY)
Admission: RE | Disposition: A | Discharge status: Home / Self Care | Payer: Self-pay | Source: Home / Self Care | Attending: Internal Medicine

## 2023-10-04 ENCOUNTER — Ambulatory Visit (HOSPITAL_BASED_OUTPATIENT_CLINIC_OR_DEPARTMENT_OTHER): Payer: Medicare Other | Admitting: Certified Registered"

## 2023-10-04 ENCOUNTER — Ambulatory Visit (HOSPITAL_COMMUNITY): Payer: Medicare Other | Admitting: Certified Registered"

## 2023-10-04 DIAGNOSIS — Z9221 Personal history of antineoplastic chemotherapy: Secondary | ICD-10-CM | POA: Diagnosis not present

## 2023-10-04 DIAGNOSIS — I5032 Chronic diastolic (congestive) heart failure: Secondary | ICD-10-CM | POA: Diagnosis not present

## 2023-10-04 DIAGNOSIS — I132 Hypertensive heart and chronic kidney disease with heart failure and with stage 5 chronic kidney disease, or end stage renal disease: Secondary | ICD-10-CM | POA: Insufficient documentation

## 2023-10-04 DIAGNOSIS — K6289 Other specified diseases of anus and rectum: Secondary | ICD-10-CM | POA: Insufficient documentation

## 2023-10-04 DIAGNOSIS — D631 Anemia in chronic kidney disease: Secondary | ICD-10-CM | POA: Insufficient documentation

## 2023-10-04 DIAGNOSIS — K219 Gastro-esophageal reflux disease without esophagitis: Secondary | ICD-10-CM | POA: Insufficient documentation

## 2023-10-04 DIAGNOSIS — N186 End stage renal disease: Secondary | ICD-10-CM | POA: Diagnosis not present

## 2023-10-04 DIAGNOSIS — Z9889 Other specified postprocedural states: Secondary | ICD-10-CM | POA: Diagnosis not present

## 2023-10-04 DIAGNOSIS — E1122 Type 2 diabetes mellitus with diabetic chronic kidney disease: Secondary | ICD-10-CM | POA: Insufficient documentation

## 2023-10-04 DIAGNOSIS — Z8619 Personal history of other infectious and parasitic diseases: Secondary | ICD-10-CM | POA: Insufficient documentation

## 2023-10-04 DIAGNOSIS — Z8719 Personal history of other diseases of the digestive system: Secondary | ICD-10-CM | POA: Diagnosis not present

## 2023-10-04 DIAGNOSIS — Z923 Personal history of irradiation: Secondary | ICD-10-CM | POA: Insufficient documentation

## 2023-10-04 DIAGNOSIS — K259 Gastric ulcer, unspecified as acute or chronic, without hemorrhage or perforation: Secondary | ICD-10-CM | POA: Insufficient documentation

## 2023-10-04 DIAGNOSIS — I509 Heart failure, unspecified: Secondary | ICD-10-CM | POA: Diagnosis not present

## 2023-10-04 DIAGNOSIS — Z853 Personal history of malignant neoplasm of breast: Secondary | ICD-10-CM | POA: Insufficient documentation

## 2023-10-04 DIAGNOSIS — K642 Third degree hemorrhoids: Secondary | ICD-10-CM | POA: Insufficient documentation

## 2023-10-04 DIAGNOSIS — I2510 Atherosclerotic heart disease of native coronary artery without angina pectoris: Secondary | ICD-10-CM

## 2023-10-04 DIAGNOSIS — R1013 Epigastric pain: Secondary | ICD-10-CM | POA: Diagnosis not present

## 2023-10-04 DIAGNOSIS — Z79899 Other long term (current) drug therapy: Secondary | ICD-10-CM | POA: Insufficient documentation

## 2023-10-04 DIAGNOSIS — K2289 Other specified disease of esophagus: Secondary | ICD-10-CM | POA: Diagnosis not present

## 2023-10-04 DIAGNOSIS — K3189 Other diseases of stomach and duodenum: Secondary | ICD-10-CM | POA: Diagnosis not present

## 2023-10-04 DIAGNOSIS — Z992 Dependence on renal dialysis: Secondary | ICD-10-CM | POA: Diagnosis not present

## 2023-10-04 DIAGNOSIS — I5031 Acute diastolic (congestive) heart failure: Secondary | ICD-10-CM | POA: Diagnosis not present

## 2023-10-04 DIAGNOSIS — E782 Mixed hyperlipidemia: Secondary | ICD-10-CM | POA: Insufficient documentation

## 2023-10-04 HISTORY — PX: ESOPHAGOGASTRODUODENOSCOPY (EGD) WITH PROPOFOL: SHX5813

## 2023-10-04 HISTORY — PX: BIOPSY: SHX5522

## 2023-10-04 LAB — POCT I-STAT, CHEM 8
BUN: 22 mg/dL (ref 8–23)
Calcium, Ion: 1.05 mmol/L — ABNORMAL LOW (ref 1.15–1.40)
Chloride: 97 mmol/L — ABNORMAL LOW (ref 98–111)
Creatinine, Ser: 7.9 mg/dL — ABNORMAL HIGH (ref 0.44–1.00)
Glucose, Bld: 77 mg/dL (ref 70–99)
HCT: 19 % — ABNORMAL LOW (ref 36.0–46.0)
Hemoglobin: 6.5 g/dL — CL (ref 12.0–15.0)
Potassium: 3 mmol/L — ABNORMAL LOW (ref 3.5–5.1)
Sodium: 138 mmol/L (ref 135–145)
TCO2: 27 mmol/L (ref 22–32)

## 2023-10-04 LAB — HEMOGLOBIN AND HEMATOCRIT, BLOOD
HCT: 23.4 % — ABNORMAL LOW (ref 36.0–46.0)
Hemoglobin: 7.8 g/dL — ABNORMAL LOW (ref 12.0–15.0)

## 2023-10-04 LAB — GLUCOSE, CAPILLARY: Glucose-Capillary: 86 mg/dL (ref 70–99)

## 2023-10-04 SURGERY — ESOPHAGOGASTRODUODENOSCOPY (EGD) WITH PROPOFOL
Anesthesia: General

## 2023-10-04 MED ORDER — PROPOFOL 10 MG/ML IV BOLUS
INTRAVENOUS | Status: DC | PRN
Start: 2023-10-04 — End: 2023-10-04
  Administered 2023-10-04: 100 mg via INTRAVENOUS

## 2023-10-04 MED ORDER — PHENYLEPHRINE 80 MCG/ML (10ML) SYRINGE FOR IV PUSH (FOR BLOOD PRESSURE SUPPORT)
PREFILLED_SYRINGE | INTRAVENOUS | Status: DC | PRN
  Administered 2023-10-04 (×2): 80 ug via INTRAVENOUS
  Administered 2023-10-04: 160 ug via INTRAVENOUS

## 2023-10-04 MED ORDER — LIDOCAINE HCL (PF) 2 % IJ SOLN
INTRAMUSCULAR | Status: DC | PRN
  Administered 2023-10-04: 100 mg via INTRADERMAL

## 2023-10-04 MED ORDER — PROPOFOL 500 MG/50ML IV EMUL
INTRAVENOUS | Status: DC | PRN
Start: 2023-10-04 — End: 2023-10-04
  Administered 2023-10-04: 150 ug/kg/min via INTRAVENOUS

## 2023-10-04 MED ORDER — LACTATED RINGERS IV SOLN: INTRAVENOUS | Status: DC

## 2023-10-04 NOTE — Discharge Instructions (Addendum)
EGD Discharge instructions Please read the instructions outlined below and refer to this sheet in the next few weeks. These discharge instructions provide you with general information on caring for yourself after you leave the hospital. Your doctor may also give you specific instructions. While your treatment has been planned according to the most current medical practices available, unavoidable complications occasionally occur. If you have any problems or questions after discharge, please call your doctor. ACTIVITY You may resume your regular activity but move at a slower pace for the next 24 hours.  Take frequent rest periods for the next 24 hours.  Walking will help expel (get rid of) the air and reduce the bloated feeling in your abdomen.  No driving for 24 hours (because of the anesthesia (medicine) used during the test).  You may shower.  Do not sign any important legal documents or operate any machinery for 24 hours (because of the anesthesia used during the test).  NUTRITION Drink plenty of fluids.  You may resume your normal diet.  Begin with a light meal and progress to your normal diet.  Avoid alcoholic beverages for 24 hours or as instructed by your caregiver.  MEDICATIONS You may resume your normal medications unless your caregiver tells you otherwise.  WHAT YOU CAN EXPECT TODAY You may experience abdominal discomfort such as a feeling of fullness or "gas" pains.  FOLLOW-UP Your doctor will discuss the results of your test with you.  SEEK IMMEDIATE MEDICAL ATTENTION IF ANY OF THE FOLLOWING OCCUR: Excessive nausea (feeling sick to your stomach) and/or vomiting.  Severe abdominal pain and distention (swelling).  Trouble swallowing.  Temperature over 101 F (37.8 C).  Rectal bleeding or vomiting of blood.     Your stomach appeared abnormal today.  Biopsies taken.  Examination the rectum revealed the cyst and grade 3 hemorrhoids  Prescription for [facility]  hemorrhoid cream being sent over from the office.  Apply pea-sized amount to the anorectum 4 times a day as needed  Further recommendations to follow next week regarding biopsy results  Office follow-up with Lewie Loron in 2 weeks.  At patient request, I called [facility] at [phone] findings and recommendations

## 2023-10-04 NOTE — Anesthesia Procedure Notes (Signed)
Date/Time: 10/04/2023 10:20 AM  Performed by: Julian Reil, CRNAPre-anesthesia Checklist: Patient identified, Emergency Drugs available, Suction available and Patient being monitored Patient Re-evaluated:Patient Re-evaluated prior to induction Oxygen Delivery Method: Nasal cannula Induction Type: IV induction Placement Confirmation: positive ETCO2 Comments: Optiflow High Flow [place] O2 used.

## 2023-10-04 NOTE — Interval H&P Note (Signed)
History and Physical Interval Note:  10/04/2023 10:10 AM  Megan [name]  [name] presented today for surgery, with the diagnosis of dyspepsia.  The various methods of treatment have been discussed with the patient and family. After consideration of risks, benefits and other options for treatment, the patient [name] consented to  Procedure(s) with comments: ESOPHAGOGASTRODUODENOSCOPY (EGD) WITH PROPOFOL (N/A) - 200pm, ok rm 1-2, pt knows to arrive at 6:45 as a surgical intervention.  The patient's history [name] been reviewed, patient examined, no change in status, stable for surgery.  I have reviewed the patient's chart and labs.  Questions were answered to the patient's satisfaction.     Wendal Wilkie   no change.  Diagnostic EGD today per plan.  Patient does have issues regarding perianal issues hemorrhoids and the cyst.  [name] a lot of pain down below.  Will check  perianal examination while she is sedated.  Denies dysphagia.  EGD today per plan.  The risks, benefits, limitations, alternatives and imponderables have been reviewed with the patient. Potential for esophageal dilation, biopsy, etc. have also been reviewed.  Questions have been answered. All parties agreeable.

## 2023-10-04 NOTE — Op Note (Signed)
[facility] Patient Name: Megan [name] Procedure Date: 10/04/2023 7:58 AM MRN: [id number] Date of Birth: 22-Jun-1961 Attending MD: Gennette Pac , MD, [id number] CSN: [id number] Age: 63 Admit Type: Outpatient Procedure:                Upper GI endoscopy Indications:              Dyspepsia Providers:                Gennette Pac, MD, Buel Ream. Museum/gallery exhibitions officer, Charity fundraiser,                            Judeth Cornfield. Jessee Avers, Technician Referring MD:             Gennette Pac, MD Medicines:                Propofol per Anesthesia Complications:            No immediate complications. Estimated Blood Loss:     Estimated blood loss was minimal. Procedure:                Pre-Anesthesia Assessment:                           - Prior to the procedure, a History and Physical                            was performed, and patient medications and                            allergies were reviewed. The patient's tolerance of                            previous anesthesia was also reviewed. The risks                            and benefits of the procedure and the sedation                            options and risks were discussed with the patient.                            All questions were answered, and informed consent                            was obtained. Prior Anticoagulants: The patient has                            taken no anticoagulant or antiplatelet agents. ASA                            Grade Assessment: II - A patient with mild systemic                            disease. After reviewing the risks and benefits,  the patient was deemed in satisfactory condition to                            undergo the procedure.                           After obtaining informed consent, the endoscope was                            passed under direct vision. Throughout the                            procedure, the patient's blood pressure, pulse, and                             oxygen saturations were monitored continuously. The                            [id number]-[id number] ([id number]) scope was introduced through the                            mouth, and advanced to the second part of duodenum.                            The upper GI endoscopy was accomplished without                            difficulty. The patient tolerated the procedure                            well. Scope In: 10:28:13 AM Scope Out: 10:40:24 AM Total Procedure Duration: 0 hours 12 minutes 11 seconds  Findings:      The examined esophagus was normal. Significant nodularity. Infiltrating       process involving the antrum between the angularis and prepyloric area.       This area was diffusely friable and was oozing upon entering the       stomach. There was no gastric outlet obstruction the pylorus was widely       patent.      The duodenal bulb and second portion of the duodenum were normal.       Utilizing jumbo biopsy forceps, the abnormal antral mucosa was biopsied       multiple times. Bleeding 10 to 15 cc. I elected to apply Hemospray       diffusely in the antrum to arrest biopsy /lesion oozing.      Please note I did not perianal digital rectal exam prior to procedure       because patient was complaining of anal / rectal pain and protrusion       with hemorrhoids. She was found to have prominent grade 3 hemorrhoids       and an inferior perianal cyst. DRE was otherwise negative. Impression:               - Normal esophagus. 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 Status post biopsy.                            Hemospray applied to the antrum for hemostasis                           - Normal duodenal bulb and second portion of the                            duodenum.                           -Grade 3 hemorrhoids and perianal cyst as described                             above Moderate Sedation:      Moderate (conscious) sedation was personally administered by an       anesthesia professional. The following parameters were monitored: oxygen       saturation, heart rate, blood pressure, respiratory rate, EKG, adequacy       of pulmonary ventilation, and response to care. Recommendation:           - Patient has a contact number available for                            emergencies. The signs and symptoms of potential                            delayed complications were discussed with the                            patient. Return to normal activities tomorrow.                            Written discharge instructions were provided to the                            patient.                           - Advance diet as tolerated. Follow-up on                            pathology. [facility] hemorrhoid cream                            (add lidocaine) 30 g. Apply pea-sized amount into                            the anorectum 4 times a day as needed                           -Follow-up with  Lewie Loron in the office in 3 weeks Procedure Code(s):        --- Professional ---                           [phone], Esophagogastroduodenoscopy, flexible,                            transoral; diagnostic, including collection of                            specimen(s) by brushing or washing, when performed                            (separate procedure) Diagnosis Code(s):        --- Professional ---                           R10.13, Epigastric pain CPT copyright 2022 American Medical Association. All rights reserved. The codes documented in this report are preliminary and upon coder review may  be revised to meet current compliance requirements. Gerrit Friends. Seraphim Trow, MD Gennette Pac, MD 10/04/2023 10:57:23 AM This report has been signed electronically. Number of Addenda: 0

## 2023-10-04 NOTE — Anesthesia Preprocedure Evaluation (Addendum)
Anesthesia Evaluation  Patient identified by MRN, date of birth, ID band Patient awake    Reviewed: Allergy & Precautions, H&P , NPO status , Patient's Chart, lab work & pertinent test results, reviewed documented beta blocker date and time   History of Anesthesia Complications (+) PONV and history of anesthetic complications  Airway Mallampati: II  TM Distance: >3 FB Neck ROM: full    Dental no notable dental hx. (+) Chipped,    Pulmonary shortness of breath, pneumonia   Pulmonary exam normal breath sounds clear to auscultation       Cardiovascular Exercise Tolerance: Good hypertension, + CAD and +CHF   Rhythm:regular Rate:Normal     Neuro/Psych  Headaches  negative psych ROS   GI/Hepatic Neg liver ROS,GERD  ,,  Endo/Other  diabetesHypothyroidism    Renal/GU ESRF and DialysisRenal disease  negative genitourinary   Musculoskeletal   Abdominal   Peds  Hematology  (+) Blood dyscrasia, anemia   Anesthesia Other Findings   Reproductive/Obstetrics negative OB ROS                             Anesthesia Physical Anesthesia Plan  ASA: 3  Anesthesia Plan: General   Post-op Pain Management:    Induction:   PONV Risk Score and Plan: Propofol infusion  Airway Management Planned:   Additional Equipment:   Intra-op Plan:   Post-operative Plan:   Informed Consent: I have reviewed the patients History and Physical, chart, labs and discussed the procedure including the risks, benefits and alternatives for the proposed anesthesia with the patient or authorized representative who has indicated his/her understanding and acceptance.     Dental Advisory Given  Plan Discussed with: CRNA  Anesthesia Plan Comments:        Anesthesia Quick Evaluation

## 2023-10-04 NOTE — Transfer of Care (Signed)
Immediate Anesthesia Transfer of Care Note  Patient: Megan [name]  Procedure(s) Performed: ESOPHAGOGASTRODUODENOSCOPY (EGD) WITH PROPOFOL BIOPSY  Patient Location: Endoscopy Unit  Anesthesia Type:General  Level of Consciousness: awake  Airway & Oxygen Therapy: Patient Spontanous Breathing  Post-op Assessment: Report given to RN and Post -op Vital signs reviewed and stable  Post vital signs: Reviewed and stable  Last Vitals:  Vitals Value Taken Time  BP 99/48 10/04/23 1045  Temp 36.6 C 10/04/23 1045  Pulse 85 10/04/23 1045  Resp 24 10/04/23 1045  SpO2 99 % 10/04/23 1045    Last Pain:  Vitals:   10/04/23 1045  TempSrc: Axillary  PainSc:       Patients Stated Pain Goal: 4 (10/04/23 0719)  Complications: No notable events documented.

## 2023-10-05 ENCOUNTER — Encounter (HOSPITAL_COMMUNITY): Payer: Self-pay | Admitting: Internal Medicine

## 2023-10-05 ENCOUNTER — Ambulatory Visit: Payer: Medicare Other | Admitting: Family Medicine

## 2023-10-05 VITALS — BP 114/52 | HR 97 | Temp 99.2°F | Ht 61.5 in | Wt 172.6 lb

## 2023-10-05 DIAGNOSIS — N186 End stage renal disease: Secondary | ICD-10-CM | POA: Diagnosis not present

## 2023-10-05 DIAGNOSIS — E1129 Type 2 diabetes mellitus with other diabetic kidney complication: Secondary | ICD-10-CM | POA: Diagnosis not present

## 2023-10-05 DIAGNOSIS — K219 Gastro-esophageal reflux disease without esophagitis: Secondary | ICD-10-CM | POA: Diagnosis not present

## 2023-10-05 DIAGNOSIS — Z992 Dependence on renal dialysis: Secondary | ICD-10-CM | POA: Diagnosis not present

## 2023-10-05 DIAGNOSIS — D631 Anemia in chronic kidney disease: Secondary | ICD-10-CM | POA: Diagnosis not present

## 2023-10-05 DIAGNOSIS — E119 Type 2 diabetes mellitus without complications: Secondary | ICD-10-CM | POA: Diagnosis not present

## 2023-10-05 DIAGNOSIS — N2581 Secondary hyperparathyroidism of renal origin: Secondary | ICD-10-CM | POA: Diagnosis not present

## 2023-10-05 LAB — SURGICAL PATHOLOGY

## 2023-10-05 MED ORDER — FAMOTIDINE 40 MG PO TABS: 40.0000 mg | ORAL_TABLET | Freq: Every day | ORAL | 3 refills | Status: DC

## 2023-10-05 NOTE — Progress Notes (Signed)
Subjective:    Patient ID: Megan [name], female    DOB: 22-Nov-1960, 63 y.o.   MRN: [id number]  Patient has been on pantoprazole for several years.  Over the last month or so she reports daily heartburn and indigestion.  She is still taking pantoprazole and is even taking Carafate 3 times a day before meals.  Had an EGD performed yesterday showed nodular friable area with bleeding in the antrum of the stomach.  Biopsy was negative for malignancy but showed reactive gastropathy. Past Medical History:  Diagnosis Date   (HFpEF) heart failure with preserved ejection fraction (HCC)    a. 01/2019 Echo: EF 55-60%, mild conc LVH. DD.  Torn MV chordae.   Anemia    Atypical chest pain    a. 08/2018 MV: EF 59%, no ischemia; b. 02/2019 Cath: nonobs dzs.   Blood transfusion without reported diagnosis    Breast cancer (HCC) 10/12/2020   Cataract    ESRD (end stage renal disease) on dialysis [facility])    a. HD T, T, S   Essential hypertension, benign    GERD (gastroesophageal reflux disease)    Headache    Hemorrhoids    Mixed hyperlipidemia    Morbid obesity (HCC)    Non-obstructive CAD (coronary artery disease)    a. 02/2019 CathL LM nl, LAD 80m, LCX nl, RCA 25p, EF 55-65%.   PONV (postoperative nausea and vomiting)    Renal insufficiency    S/P colonoscopy 08/2009   Dr. Elnoria Howard: sessile polyp (benign lymphoid), large hemorrhoids, repeat 5-10 years   Temporal arteritis (HCC)    Type 2 diabetes mellitus (HCC)    Wears glasses    Past Surgical History:  Procedure Laterality Date   ABDOMINAL HYSTERECTOMY     APPENDECTOMY     ARTERY BIOPSY N/A 05/09/2018   Procedure: RIGHT TEMPORAL ARTERY BIOPSY;  Surgeon: Jimmye Norman, MD;  Location: [facility] OR;  Service: General;  Laterality: N/A;   BIOPSY  10/04/2023   Procedure: BIOPSY;  Surgeon: Corbin Ade, MD;  Location: [facility];  Service: Endoscopy;;   BREAST BIOPSY Right 06/15/2020   Procedure: RIGHT BREAST BIOPSY;  Surgeon: Franky Macho, MD;   Location: [facility] ORS;  Service: General;  Laterality: Right;   CATARACT EXTRACTION W/PHACO Left 02/09/2017   Procedure: CATARACT EXTRACTION PHACO AND INTRAOCULAR LENS PLACEMENT LEFT EYE;  Surgeon: Gemma Payor, MD;  Location: [facility] ORS;  Service: Ophthalmology;  Laterality: Left;  CDE: 4.89   CATARACT EXTRACTION W/PHACO Right 06/04/2017   Procedure: CATARACT EXTRACTION PHACO AND INTRAOCULAR LENS PLACEMENT (IOC);  Surgeon: Gemma Payor, MD;  Location: [facility]S;  Service: Ophthalmology;  Laterality: Right;  CDE: 4.12   CHOLECYSTECTOMY  09/29/2011   Procedure: LAPAROSCOPIC CHOLECYSTECTOMY;  Surgeon: Dalia Heading, MD;  Location: [facility] ORS;  Service: General;  Laterality: N/A;   COLONOSCOPY  08/2009   Dr. Elnoria Howard: sessile polyp (benign lymphoid), large hemorrhoids, repeat 5-10 years   COLONOSCOPY N/A 06/12/2016   prominent hemorrhoids   COLONOSCOPY WITH PROPOFOL N/A 06/16/2021   Procedure: COLONOSCOPY WITH PROPOFOL;  Surgeon: Corbin Ade, MD;  [facility];  Service: Endoscopy;  Laterality: N/A;  9:30am (dialysis pt)   ESOPHAGOGASTRODUODENOSCOPY  09/05/2011   [id number] hiatal hernia; remainder of exam normal. No explanation for patient's abdominal pain with today's examination   ESOPHAGOGASTRODUODENOSCOPY N/A 12/17/2013   Dr. Jena Gauss: gastric erythema, erosion, mild chronic inflammation on path    ESOPHAGOGASTRODUODENOSCOPY (EGD) WITH PROPOFOL N/A 06/16/2021   Procedure: ESOPHAGOGASTRODUODENOSCOPY (EGD) WITH  PROPOFOL;  Surgeon: Corbin Ade, MD;  [facility];  Service: Endoscopy;  Laterality: N/A;   ESOPHAGOGASTRODUODENOSCOPY (EGD) WITH PROPOFOL N/A 10/04/2023   Procedure: ESOPHAGOGASTRODUODENOSCOPY (EGD) WITH PROPOFOL;  Surgeon: Corbin Ade, MD;  [facility];  Service: Endoscopy;  Laterality: N/A;  200pm, ok rm 1-2, pt knows to arrive at 6:45   EXCISION OF BREAST BIOPSY Right 10/12/2020   Procedure: EXCISION OF RIGHT BREAST BIOPSY;  Surgeon: Franky Macho, MD;  Location:  [facility];  Service: General;  Laterality: Right;   IR DIALY SHUNT INTRO NEEDLE/INTRACATH INITIAL W/IMG LEFT Left 07/17/2023   LAPAROSCOPIC APPENDECTOMY  09/29/2011   Procedure: APPENDECTOMY LAPAROSCOPIC;  Surgeon: Dalia Heading, MD;  Location: [facility];  Service: General;;  incidental appendectomy   LEFT HEART CATH AND CORONARY ANGIOGRAPHY N/A 02/28/2019   Procedure: LEFT HEART CATH AND CORONARY ANGIOGRAPHY;  Surgeon: Corky Crafts, MD;  Location: [facility] INVASIVE CV LAB;  Service: Cardiovascular;  Laterality: N/A;   MASS EXCISION Right 01/18/2023   Procedure: EXCISION MASS, RIGHT CHEST S/P MASTECTOMY;  Surgeon: Franky Macho, MD;  Location: [facility];  Service: General;  Laterality: Right;   MASTECTOMY MODIFIED RADICAL Right 02/18/2020   Procedure: MASTECTOMY MODIFIED RADICAL;  Surgeon: Franky Macho, MD;  Location: [facility] OR[facility];  Service: General;  Laterality: Right;   MASTECTOMY, PARTIAL Right 07/13/2020   Procedure: RIGHT PARTIAL MASTECTOMY;  Surgeon: Franky Macho, MD;  Location: [facility];  Service: General;  Laterality: Right;   PARTIAL MASTECTOMY WITH NEEDLE LOCALIZATION AND AXILLARY SENTINEL LYMPH NODE BX Right 09/18/2018   Procedure: RIGHT PARTIAL MASTECTOMY AFTER NEEDLE LOCALIZATION, SENTINEL LYMPH NODE BIOPSY RIGHT AXILLA;  Surgeon: Franky Macho, MD;  Location: [facility];  Service: General;  Laterality: Right;   POLYPECTOMY  06/16/2021   Procedure: POLYPECTOMY;  Surgeon: Corbin Ade, MD;  [facility];  Service: Endoscopy;;   PORTACATH PLACEMENT Right 06/07/2023   Procedure: INSERTION PORT-A-CATH, RIGHT  (DIALYSIS ACCESS ON LEFT);  Surgeon: Franky Macho, MD;  Location: [facility];  Service: General;  Laterality: Right;   SIMPLE MASTECTOMY WITH AXILLARY SENTINEL NODE BIOPSY Left 06/15/2020   Procedure: LEFT SIMPLE MASTECTOMY;  Surgeon: Franky Macho, MD;  Location: [facility];  Service: General;  Laterality: Left;   Current Outpatient Medications on File Prior to Visit  Medication Sig Dispense  Refill   acetaminophen (TYLENOL) 500 MG tablet Take 500-1,000 mg by mouth every 6 (six) hours as needed (pain.).     amLODipine (NORVASC) 10 MG tablet TAKE 1 TABLET BY MOUTH AT BEDTIME 90 tablet 3   anastrozole (ARIMIDEX) 1 MG tablet TAKE 1 TABLET BY MOUTH DAILY 90 tablet 3   aspirin EC 81 MG tablet Take 81 mg by mouth in the morning.     atorvastatin (LIPITOR) 20 MG tablet TAKE 1 TABLET(20 MG) BY MOUTH DAILY 90 tablet 0   B Complex-C-Zn-Folic Acid (DIALYVITE 800-ZINC 15) 0.8 MG TABS Take 1 tablet by mouth in the morning.     cephALEXin (KEFLEX) 500 MG capsule Take 1 capsule every 6 hours by oral route for 10 days.     cinacalcet (SENSIPAR) 30 MG tablet Take 2 tablets (60 mg total) by mouth daily. (Patient taking differently: Take 90 mg by mouth daily with breakfast. With a meal) 90 tablet 0   Darbepoetin Alfa (ARANESP, ALBUMIN FREE, IJ) Darbepoetin Alfa (Aranesp)     fluticasone (FLONASE) 50 MCG/ACT nasal spray Place 2 sprays into both nostrils daily. 16 g 6   gabapentin (NEURONTIN)  300 MG capsule TAKE 1 CAPSULE(300 MG) BY MOUTH AT BEDTIME 90 capsule 3   glipiZIDE (GLUCOTROL XL) 5 MG 24 hr tablet Take 1 tablet by mouth daily.     hydrALAZINE (APRESOLINE) 25 MG tablet Take by mouth.     iron sucrose (VENOFER) 20 MG/ML injection Inject 50 mg into the vein once a week.     lactulose (CHRONULAC) 10 GM/15ML solution Take by mouth.     lanthanum (FOSRENOL) 1000 MG chewable tablet Chew 2,000-3,000 mg by mouth See admin instructions. Take 3 tablets (3000 mg) by mouth with meals and take 2 tablets (2000 mg) with snacks     lidocaine-prilocaine (EMLA) cream APPLY TOPICALLY TO THE AFFECTED AREA 1 TIME 30 g 3   losartan (COZAAR) 100 MG tablet Take by mouth.     meclizine (ANTIVERT) 25 MG tablet Take 1 tablet (25 mg total) by mouth 3 (three) times daily as needed for dizziness. 30 tablet 1   nebivolol (BYSTOLIC) 10 MG tablet TAKE 1 TABLET BY MOUTH IN THE MORNING AND AT BEDTIME 180 tablet 0   nitroGLYCERIN  (NITROSTAT) 0.4 MG SL tablet Place 1 tablet (0.4 mg total) under the tongue every 5 (five) minutes x 3 doses as needed for chest pain (if no relief after 3rd dose, proceed to ED or call 911). 25 tablet 3   ondansetron (ZOFRAN) 4 MG tablet Take 2 tablets (8 mg total) by mouth every 8 (eight) hours as needed for nausea or vomiting. 90 tablet 0   oxyCODONE (ROXICODONE) 5 MG immediate release tablet Take 1 tablet (5 mg total) by mouth every 6 (six) hours as needed. 30 tablet 0   pantoprazole (PROTONIX) 40 MG tablet TAKE 1 TABLET(40 MG) BY MOUTH DAILY 30 tablet 1   prochlorperazine (COMPAZINE) 10 MG tablet Take 1 tablet (10 mg total) by mouth every 6 (six) hours as needed for nausea or vomiting. 60 tablet 3   sucralfate (CARAFATE) 1 g tablet Take 1 tablet (1 g total) by mouth 4 (four) times daily -  before meals and at bedtime. Crush in small amount of water and mix. 120 tablet 1   VITAMIN D PO Take 0.25 mcg by mouth daily.     No current facility-administered medications on file prior to visit.   Allergies  Allergen Reactions   Ibuprofen Other (See Comments)    esrd  Other Reaction(s): Other (See Comments)    esrd   Social History   Socioeconomic History   Marital status: Married    Spouse name: Not on file   Number of children: Not on file   Years of education: Not on file   Highest education level: 12th grade  Occupational History   Occupation: Systems developer: [employer]  Tobacco Use   Smoking status: Never    Passive exposure: Never   Smokeless tobacco: Never  Vaping Use   Vaping status: Never Used  Substance and Sexual Activity   Alcohol use: No   Drug use: No   Sexual activity: Yes    Birth control/protection: Surgical  Other Topics Concern   Not on file  Social History Narrative   Works at [employer] in [place].    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   Also has to get items from high shelves-causes achy pain in shoulder area      Married.    Children are grown, out of house.   Social Drivers of  Health   Financial Resource Strain: Low Risk  (09/20/2023)   Overall Financial Resource Strain (CARDIA)    Difficulty of Paying Living Expenses: Not hard at all  Food Insecurity: No Food Insecurity (09/20/2023)   Hunger Vital Sign    Worried About Running Out of Food in the Last Year: Never true    Ran Out of Food in the Last Year: Never true  Transportation Needs: No Transportation Needs (09/20/2023)   PRAPARE - Administrator, Civil Service (Medical): No    Lack of Transportation (Non-Medical): No  Physical Activity: Inactive (09/20/2023)   Exercise Vital Sign    Days of Exercise per Week: 0 days    Minutes of Exercise per Session: 0 min  Stress: No Stress Concern Present (09/20/2023)   [employer] of Occupational Health - Occupational Stress Questionnaire    Feeling of Stress : Not at all  Social Connections: Moderately Integrated (09/20/2023)   Social Connection and Isolation Panel [NHANES]    Frequency of Communication with Friends and Family: More than three times a week    Frequency of Social Gatherings with Friends and Family: Three times a week    Attends Religious Services: More than 4 times per year    Active Member of Clubs or Organizations: No    Attends Banker Meetings: Never    Marital Status: Married  Catering manager Violence: Not At Risk (09/20/2023)   Humiliation, Afraid, Rape, and Kick questionnaire    Fear of Current or Ex-Partner: No    Emotionally Abused: No    Physically Abused: No    Sexually Abused: No    Past Medical History:  Diagnosis Date   (HFpEF) heart failure with preserved ejection fraction (HCC)    a. 01/2019 Echo: EF 55-60%, mild conc LVH. DD.  Torn MV chordae.   Anemia    Atypical chest pain    a. 08/2018 MV: EF 59%, no ischemia; b. 02/2019 Cath: nonobs dzs.   Blood transfusion without reported diagnosis    Breast cancer (HCC) 10/12/2020   Cataract    ESRD (end  stage renal disease) on dialysis [facility])    a. HD T, T, S   Essential hypertension, benign    GERD (gastroesophageal reflux disease)    Headache    Hemorrhoids    Mixed hyperlipidemia    Morbid obesity (HCC)    Non-obstructive CAD (coronary artery disease)    a. 02/2019 CathL LM nl, LAD 62m, LCX nl, RCA 25p, EF 55-65%.   PONV (postoperative nausea and vomiting)    Renal insufficiency    S/P colonoscopy 08/2009   Dr. Elnoria Howard: sessile polyp (benign lymphoid), large hemorrhoids, repeat 5-10 years   Temporal arteritis (HCC)    Type 2 diabetes mellitus (HCC)    Wears glasses    Past Surgical History:  Procedure Laterality Date   ABDOMINAL HYSTERECTOMY     APPENDECTOMY     ARTERY BIOPSY N/A 05/09/2018   Procedure: RIGHT TEMPORAL ARTERY BIOPSY;  Surgeon: Jimmye Norman, MD;  Location: [facility] OR;  Service: General;  Laterality: N/A;   BIOPSY  10/04/2023   Procedure: BIOPSY;  Surgeon: Corbin Ade, MD;  [facility];  Service: Endoscopy;;   BREAST BIOPSY Right 06/15/2020   Procedure: RIGHT BREAST BIOPSY;  Surgeon: Franky Macho, MD;  Location: [facility];  Service: General;  Laterality: Right;   CATARACT EXTRACTION W/PHACO Left 02/09/2017   Procedure: CATARACT EXTRACTION PHACO AND INTRAOCULAR LENS PLACEMENT LEFT EYE;  Surgeon:  Gemma Payor, MD;  Location: [facility];  Service: Ophthalmology;  Laterality: Left;  CDE: 4.89   CATARACT EXTRACTION W/PHACO Right 06/04/2017   Procedure: CATARACT EXTRACTION PHACO AND INTRAOCULAR LENS PLACEMENT (IOC);  Surgeon: Gemma Payor, MD;  Location: [facility];  Service: Ophthalmology;  Laterality: Right;  CDE: 4.12   CHOLECYSTECTOMY  09/29/2011   Procedure: LAPAROSCOPIC CHOLECYSTECTOMY;  Surgeon: Dalia Heading, MD;  Location: [facility];  Service: General;  Laterality: N/A;   COLONOSCOPY  08/2009   Dr. Elnoria Howard: sessile polyp (benign lymphoid), large hemorrhoids, repeat 5-10 years   COLONOSCOPY N/A 06/12/2016   prominent hemorrhoids   COLONOSCOPY WITH PROPOFOL N/A 06/16/2021    Procedure: COLONOSCOPY WITH PROPOFOL;  Surgeon: Corbin Ade, MD;  [facility];  Service: Endoscopy;  Laterality: N/A;  9:30am (dialysis pt)   ESOPHAGOGASTRODUODENOSCOPY  09/05/2011   [id number] hiatal hernia; remainder of exam normal. No explanation for patient's abdominal pain with today's examination   ESOPHAGOGASTRODUODENOSCOPY N/A 12/17/2013   Dr. Jena Gauss: gastric erythema, erosion, mild chronic inflammation on path    ESOPHAGOGASTRODUODENOSCOPY (EGD) WITH PROPOFOL N/A 06/16/2021   Procedure: ESOPHAGOGASTRODUODENOSCOPY (EGD) WITH PROPOFOL;  Surgeon: Corbin Ade, MD;  [facility];  Service: Endoscopy;  Laterality: N/A;   ESOPHAGOGASTRODUODENOSCOPY (EGD) WITH PROPOFOL N/A 10/04/2023   Procedure: ESOPHAGOGASTRODUODENOSCOPY (EGD) WITH PROPOFOL;  Surgeon: Corbin Ade, MD;  [facility];  Service: Endoscopy;  Laterality: N/A;  200pm, ok rm 1-2, pt knows to arrive at 6:45   EXCISION OF BREAST BIOPSY Right 10/12/2020   Procedure: EXCISION OF RIGHT BREAST BIOPSY;  Surgeon: Franky Macho, MD;  Location: [facility];  Service: General;  Laterality: Right;   IR DIALY SHUNT INTRO NEEDLE/INTRACATH INITIAL W/IMG LEFT Left 07/17/2023   LAPAROSCOPIC APPENDECTOMY  09/29/2011   Procedure: APPENDECTOMY LAPAROSCOPIC;  Surgeon: Dalia Heading, MD;  Location: [facility];  Service: General;;  incidental appendectomy   LEFT HEART CATH AND CORONARY ANGIOGRAPHY N/A 02/28/2019   Procedure: LEFT HEART CATH AND CORONARY ANGIOGRAPHY;  Surgeon: Corky Crafts, MD;  Location: [facility] INVASIVE CV LAB;  Service: Cardiovascular;  Laterality: N/A;   MASS EXCISION Right 01/18/2023   Procedure: EXCISION MASS, RIGHT CHEST S/P MASTECTOMY;  Surgeon: Franky Macho, MD;  Location: [facility];  Service: General;  Laterality: Right;   MASTECTOMY MODIFIED RADICAL Right 02/18/2020   Procedure: MASTECTOMY MODIFIED RADICAL;  Surgeon: Franky Macho, MD;  Location: [facility];  Service: General;  Laterality: Right;    MASTECTOMY, PARTIAL Right 07/13/2020   Procedure: RIGHT PARTIAL MASTECTOMY;  Surgeon: Franky Macho, MD;  Location: [facility];  Service: General;  Laterality: Right;   PARTIAL MASTECTOMY WITH NEEDLE LOCALIZATION AND AXILLARY SENTINEL LYMPH NODE BX Right 09/18/2018   Procedure: RIGHT PARTIAL MASTECTOMY AFTER NEEDLE LOCALIZATION, SENTINEL LYMPH NODE BIOPSY RIGHT AXILLA;  Surgeon: Franky Macho, MD;  Location: [facility];  Service: General;  Laterality: Right;   POLYPECTOMY  06/16/2021   Procedure: POLYPECTOMY;  Surgeon: Corbin Ade, MD;  [facility];  Service: Endoscopy;;   PORTACATH PLACEMENT Right 06/07/2023   Procedure: INSERTION PORT-A-CATH, RIGHT  (DIALYSIS ACCESS ON LEFT);  Surgeon: Franky Macho, MD;  Location: [facility];  Service: General;  Laterality: Right;   SIMPLE MASTECTOMY WITH AXILLARY SENTINEL NODE BIOPSY Left 06/15/2020   Procedure: LEFT SIMPLE MASTECTOMY;  Surgeon: Franky Macho, MD;  Location: [facility];  Service: General;  Laterality: Left;   Current Outpatient Medications on File Prior to Visit  Medication Sig Dispense Refill   acetaminophen (TYLENOL)  500 MG tablet Take 500-1,000 mg by mouth every 6 (six) hours as needed (pain.).     amLODipine (NORVASC) 10 MG tablet TAKE 1 TABLET BY MOUTH AT BEDTIME 90 tablet 3   anastrozole (ARIMIDEX) 1 MG tablet TAKE 1 TABLET BY MOUTH DAILY 90 tablet 3   aspirin EC 81 MG tablet Take 81 mg by mouth in the morning.     atorvastatin (LIPITOR) 20 MG tablet TAKE 1 TABLET(20 MG) BY MOUTH DAILY 90 tablet 0   B Complex-C-Zn-Folic Acid (DIALYVITE 800-ZINC 15) 0.8 MG TABS Take 1 tablet by mouth in the morning.     cephALEXin (KEFLEX) 500 MG capsule Take 1 capsule every 6 hours by oral route for 10 days.     cinacalcet (SENSIPAR) 30 MG tablet Take 2 tablets (60 mg total) by mouth daily. (Patient taking differently: Take 90 mg by mouth daily with breakfast. With a meal) 90 tablet 0   Darbepoetin Alfa (ARANESP, ALBUMIN FREE, IJ) Darbepoetin Alfa  (Aranesp)     fluticasone (FLONASE) 50 MCG/ACT nasal spray Place 2 sprays into both nostrils daily. 16 g 6   gabapentin (NEURONTIN) 300 MG capsule TAKE 1 CAPSULE(300 MG) BY MOUTH AT BEDTIME 90 capsule 3   glipiZIDE (GLUCOTROL XL) 5 MG 24 hr tablet Take 1 tablet by mouth daily.     hydrALAZINE (APRESOLINE) 25 MG tablet Take by mouth.     iron sucrose (VENOFER) 20 MG/ML injection Inject 50 mg into the vein once a week.     lactulose (CHRONULAC) 10 GM/15ML solution Take by mouth.     lanthanum (FOSRENOL) 1000 MG chewable tablet Chew 2,000-3,000 mg by mouth See admin instructions. Take 3 tablets (3000 mg) by mouth with meals and take 2 tablets (2000 mg) with snacks     lidocaine-prilocaine (EMLA) cream APPLY TOPICALLY TO THE AFFECTED AREA 1 TIME 30 g 3   losartan (COZAAR) 100 MG tablet Take by mouth.     meclizine (ANTIVERT) 25 MG tablet Take 1 tablet (25 mg total) by mouth 3 (three) times daily as needed for dizziness. 30 tablet 1   nebivolol (BYSTOLIC) 10 MG tablet TAKE 1 TABLET BY MOUTH IN THE MORNING AND AT BEDTIME 180 tablet 0   nitroGLYCERIN (NITROSTAT) 0.4 MG SL tablet Place 1 tablet (0.4 mg total) under the tongue every 5 (five) minutes x 3 doses as needed for chest pain (if no relief after 3rd dose, proceed to ED or call 911). 25 tablet 3   ondansetron (ZOFRAN) 4 MG tablet Take 2 tablets (8 mg total) by mouth every 8 (eight) hours as needed for nausea or vomiting. 90 tablet 0   oxyCODONE (ROXICODONE) 5 MG immediate release tablet Take 1 tablet (5 mg total) by mouth every 6 (six) hours as needed. 30 tablet 0   pantoprazole (PROTONIX) 40 MG tablet TAKE 1 TABLET(40 MG) BY MOUTH DAILY 30 tablet 1   prochlorperazine (COMPAZINE) 10 MG tablet Take 1 tablet (10 mg total) by mouth every 6 (six) hours as needed for nausea or vomiting. 60 tablet 3   sucralfate (CARAFATE) 1 g tablet Take 1 tablet (1 g total) by mouth 4 (four) times daily -  before meals and at bedtime. Crush in small amount of water and  mix. 120 tablet 1   VITAMIN D PO Take 0.25 mcg by mouth daily.     No current facility-administered medications on file prior to visit.   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   Also has to get items from high shelves-causes achy pain in shoulder area      Married.   Children are grown, out of house.   Social Drivers of Corporate investment banker Strain: Low Risk  (09/20/2023)   Overall Financial Resource Strain (CARDIA)    Difficulty of Paying Living Expenses: Not hard at all  Food Insecurity: No Food Insecurity (09/20/2023)   Hunger Vital Sign    Worried About Running Out of Food in the Last Year: Never true    Ran Out of Food in the Last Year: Never true  Transportation Needs: No Transportation Needs (09/20/2023)   PRAPARE - Administrator, Civil Service (Medical): No    Lack of Transportation (Non-Medical): No  Physical Activity: Inactive (09/20/2023)   Exercise Vital Sign    Days of Exercise per Week: 0 days    Minutes of Exercise per Session: 0 min  Stress: No Stress Concern Present (09/20/2023)   [employer] of Occupational Health - Occupational Stress Questionnaire    Feeling of Stress  : Not at all  Social Connections: Moderately Integrated (09/20/2023)   Social Connection and Isolation Panel [NHANES]    Frequency of Communication with Friends and Family: More than three times a week    Frequency of Social Gatherings with Friends and Family: Three times a week    Attends Religious Services: More than 4 times per year    Active Member of Clubs or Organizations: No    Attends Banker Meetings: Never    Marital Status: Married  Catering manager Violence: Not At Risk (09/20/2023)   Humiliation, Afraid, Rape, and Kick questionnaire    Fear of Current or Ex-Partner: No    Emotionally Abused: No    Physically Abused: No    Sexually Abused: No      Review of Systems  All other systems reviewed and are negative.      Objective:   Physical Exam Vitals reviewed.  Constitutional:      General: She is not in acute distress.    Appearance: Normal appearance. She is normal weight.  Cardiovascular:     Rate and Rhythm: Normal rate and regular rhythm.     Pulses: Normal pulses.     Heart sounds: Murmur (thrill from AV fistual heard as murmur) heard.  Pulmonary:     Effort: Pulmonary effort is normal. No respiratory distress.     Breath sounds: Normal breath sounds. No stridor. No wheezing, rhonchi or rales.  Chest:     Chest  wall: No tenderness.  Musculoskeletal:     Right lower leg: No edema.     Left lower leg: No edema.     Right foot: Normal range of motion. No deformity.  Feet:     Right foot:     Skin integrity: No ulcer, blister, skin breakdown, erythema, warmth or callus.     Left foot:     Skin integrity: No ulcer, blister, skin breakdown, erythema, warmth or callus.  Neurological:     Mental Status: She is alert.           Assessment & Plan:  Gastroesophageal reflux disease without esophagitis Patient has refractory recalcitrant GERD.  Continue pantoprazole 40 mg daily.  Add Pepcid 40 mg at night.  Elevate the head of the bed 2 inches.   Avoid spicy acidic foods.  Continue sucralfate.  Consider voquezna if not better.

## 2023-10-06 NOTE — Anesthesia Postprocedure Evaluation (Signed)
 Anesthesia Post Note  Patient: Megan [name]  Procedure(s) Performed: ESOPHAGOGASTRODUODENOSCOPY (EGD) WITH PROPOFOL BIOPSY  Patient location during evaluation: Phase II Anesthesia Type: General Level of consciousness: awake Pain management: pain level controlled Vital Signs Assessment: post-procedure vital signs reviewed and stable Respiratory status: spontaneous breathing and respiratory function stable Cardiovascular status: blood pressure returned to baseline and stable Postop Assessment: no headache and no apparent nausea or vomiting Anesthetic complications: no Comments: Late entry   No notable events documented.   Last Vitals:  Vitals:   10/04/23 1045 10/04/23 1048  BP: (!) 99/48 (!) 96/51  Pulse: 85 87  Resp: (!) 24 20  Temp: 36.6 C   SpO2: 99% 100%    Last Pain:  Vitals:   10/04/23 1048  TempSrc:   PainSc: 0-No pain                 Windell Norfolk

## 2023-10-08 ENCOUNTER — Other Ambulatory Visit: Payer: Self-pay

## 2023-10-08 DIAGNOSIS — D631 Anemia in chronic kidney disease: Secondary | ICD-10-CM | POA: Diagnosis not present

## 2023-10-08 DIAGNOSIS — E1129 Type 2 diabetes mellitus with other diabetic kidney complication: Secondary | ICD-10-CM | POA: Diagnosis not present

## 2023-10-08 DIAGNOSIS — Z992 Dependence on renal dialysis: Secondary | ICD-10-CM | POA: Diagnosis not present

## 2023-10-08 DIAGNOSIS — N2581 Secondary hyperparathyroidism of renal origin: Secondary | ICD-10-CM | POA: Diagnosis not present

## 2023-10-08 DIAGNOSIS — E119 Type 2 diabetes mellitus without complications: Secondary | ICD-10-CM | POA: Diagnosis not present

## 2023-10-08 DIAGNOSIS — N186 End stage renal disease: Secondary | ICD-10-CM | POA: Diagnosis not present

## 2023-10-08 MED ORDER — PANTOPRAZOLE SODIUM 40 MG PO TBEC: 40.0000 mg | DELAYED_RELEASE_TABLET | Freq: Two times a day (BID) | ORAL | 11 refills | Status: AC

## 2023-10-09 DIAGNOSIS — N764 Abscess of vulva: Secondary | ICD-10-CM | POA: Diagnosis not present

## 2023-10-09 DIAGNOSIS — N9089 Other specified noninflammatory disorders of vulva and perineum: Secondary | ICD-10-CM | POA: Diagnosis not present

## 2023-10-10 ENCOUNTER — Telehealth: Payer: Self-pay

## 2023-10-10 DIAGNOSIS — D631 Anemia in chronic kidney disease: Secondary | ICD-10-CM | POA: Diagnosis not present

## 2023-10-10 DIAGNOSIS — N186 End stage renal disease: Secondary | ICD-10-CM | POA: Diagnosis not present

## 2023-10-10 DIAGNOSIS — E1129 Type 2 diabetes mellitus with other diabetic kidney complication: Secondary | ICD-10-CM | POA: Diagnosis not present

## 2023-10-10 DIAGNOSIS — E119 Type 2 diabetes mellitus without complications: Secondary | ICD-10-CM | POA: Diagnosis not present

## 2023-10-10 DIAGNOSIS — Z992 Dependence on renal dialysis: Secondary | ICD-10-CM | POA: Diagnosis not present

## 2023-10-10 DIAGNOSIS — N2581 Secondary hyperparathyroidism of renal origin: Secondary | ICD-10-CM | POA: Diagnosis not present

## 2023-10-10 NOTE — Telephone Encounter (Signed)
 Copied from CRM [id number]. Topic: Clinical - Medical Advice >> Oct 10, 2023  2:58 PM Ivette P wrote: Reason for CRM: Pt wanted to notify Dr. Tanya Nones and his nurse that she is back on the gabapentin (NEURONTIN) 300 MG capsule after being off of it since 02/21. Pt was in too much pain. Pt requesting callback Number [phone]

## 2023-10-11 ENCOUNTER — Ambulatory Visit: Payer: Medicare Other | Admitting: Gastroenterology

## 2023-10-11 ENCOUNTER — Encounter: Payer: Self-pay | Admitting: Gastroenterology

## 2023-10-11 VITALS — BP 93/58 | HR 77 | Temp 98.2°F | Ht 61.5 in | Wt 170.6 lb

## 2023-10-11 DIAGNOSIS — R1013 Epigastric pain: Secondary | ICD-10-CM | POA: Diagnosis not present

## 2023-10-11 DIAGNOSIS — K625 Hemorrhage of anus and rectum: Secondary | ICD-10-CM

## 2023-10-11 DIAGNOSIS — I959 Hypotension, unspecified: Secondary | ICD-10-CM | POA: Diagnosis not present

## 2023-10-11 DIAGNOSIS — D509 Iron deficiency anemia, unspecified: Secondary | ICD-10-CM

## 2023-10-11 DIAGNOSIS — K2970 Gastritis, unspecified, without bleeding: Secondary | ICD-10-CM | POA: Diagnosis not present

## 2023-10-11 DIAGNOSIS — K648 Other hemorrhoids: Secondary | ICD-10-CM

## 2023-10-11 LAB — CBC WITH DIFFERENTIAL/PLATELET
Absolute Lymphocytes: 1018 {cells}/uL (ref 850–3900)
Absolute Monocytes: 824 {cells}/uL (ref 200–950)
Basophils Absolute: 54 {cells}/uL (ref 0–200)
Basophils Relative: 0.8 %
Eosinophils Absolute: 0 {cells}/uL — ABNORMAL LOW (ref 15–500)
Eosinophils Relative: 0 %
HCT: 23.3 % — ABNORMAL LOW (ref 35.0–45.0)
Hemoglobin: 7.7 g/dL — ABNORMAL LOW (ref 11.7–15.5)
MCH: 34.4 pg — ABNORMAL HIGH (ref 27.0–33.0)
MCHC: 33 g/dL (ref 32.0–36.0)
MCV: 104 fL — ABNORMAL HIGH (ref 80.0–100.0)
MPV: 11.9 fL (ref 7.5–12.5)
Monocytes Relative: 12.3 %
Neutro Abs: 4804 {cells}/uL (ref 1500–7800)
Neutrophils Relative %: 71.7 %
Platelets: 128 10*3/uL — ABNORMAL LOW (ref 140–400)
RBC: 2.24 10*6/uL — ABNORMAL LOW (ref 3.80–5.10)
Total Lymphocyte: 15.2 %
WBC: 6.7 10*3/uL (ref 3.8–10.8)

## 2023-10-11 NOTE — Progress Notes (Addendum)
 Gastroenterology Office Note     Primary Care Physician:  Donita Brooks, MD  Primary Gastroenterologist: Dr. Jena Gauss    Chief Complaint   Chief Complaint  Patient presents with   Follow-up    Follow up on hemorrhoid bleeding, but the pt stated she has not seen any past few days     History of Present Illness   Megan [name] is a 63 y.o. female presenting today with a history of   internal hemorrhoids s/p banding in 2021 and April/May 2023, Cdiff in July 2022, anemia, HFpEF, breast cancer, GERD, HTN, ESRD on dialysis, CAD, HLD, and diabetes, recently with new onset dyspepsia s/p EGD as noted below. She has also had issues with persistent rectal bleeding in setting of Grade 3 hemorrhoids.   Had perianal cyst drained by Dr. Huntley Dec. Had started draining. Last few days no rectal bleeding. Using compounded cream and notes much more improvement. Blood transfusion on 2/13. Really dizzy right now. Took BP meds before checking it today and now BP 93/58. Took Bystolic and norvasc this morning. Feeling dizzy today. Last bleeding a week ago. She does not want to go to the ED.   Patient would like to be called in future with any results.   EGD Feb 2025: normal esophagus, nodular infiltrating appearing antral mucosa suspicious for neoplasia with spontaneous oozing s/p biopsy and hemospray. Path with reactive gastritis, no malignancy.   Colonoscopy Nov 2022: non-bleeding internal hemorrhoids EGD Nov 2022: normal esophagus, small hiatal hernia, one gastric polyp s/p removal, normal duodenum. Path with hyperplastic polyp.    Past Medical History:  Diagnosis Date   (HFpEF) heart failure with preserved ejection fraction (HCC)    a. 01/2019 Echo: EF 55-60%, mild conc LVH. DD.  Torn MV chordae.   Anemia    Atypical chest pain    a. 08/2018 MV: EF 59%, no ischemia; b. 02/2019 Cath: nonobs dzs.   Blood transfusion without reported diagnosis    Breast cancer (HCC) 10/12/2020   Cataract     ESRD (end stage renal disease) on dialysis [facility])    a. HD T, T, S   Essential hypertension, benign    GERD (gastroesophageal reflux disease)    Headache    Hemorrhoids    Mixed hyperlipidemia    Morbid obesity (HCC)    Non-obstructive CAD (coronary artery disease)    a. 02/2019 CathL LM nl, LAD 38m, LCX nl, RCA 25p, EF 55-65%.   PONV (postoperative nausea and vomiting)    Renal insufficiency    S/P colonoscopy 08/2009   Dr. Elnoria Howard: sessile polyp (benign lymphoid), large hemorrhoids, repeat 5-10 years   Temporal arteritis (HCC)    Type 2 diabetes mellitus (HCC)    Wears glasses     Past Surgical History:  Procedure Laterality Date   ABDOMINAL HYSTERECTOMY     APPENDECTOMY     ARTERY BIOPSY N/A 05/09/2018   Procedure: RIGHT TEMPORAL ARTERY BIOPSY;  Surgeon: Jimmye Norman, MD;  Location: [facility] OR;  Service: General;  Laterality: N/A;   BIOPSY  10/04/2023   Procedure: BIOPSY;  Surgeon: Corbin Ade, MD;  Location[facility];  Service: Endoscopy;;   BREAST BIOPSY Right 06/15/2020   Procedure: RIGHT BREAST BIOPSY;  Surgeon: Franky Macho, MD;  Location: [facility] ORS;  Service: General;  Laterality: Right;   CATARACT EXTRACTION W/PHACO Left 02/09/2017   Procedure: CATARACT EXTRACTION PHACO AND INTRAOCULAR LENS PLACEMENT LEFT EYE;  Surgeon: Gemma Payor, MD;  Location: [facility] ORS;  Service: Ophthalmology;  Laterality: Left;  CDE: 4.89   CATARACT EXTRACTION W/PHACO Right 06/04/2017   Procedure: CATARACT EXTRACTION PHACO AND INTRAOCULAR LENS PLACEMENT (IOC);  Surgeon: Gemma Payor, MD;  Location: [facility] OR[facility];  Service: Ophthalmology;  Laterality: Right;  CDE: 4.12   CHOLECYSTECTOMY  09/29/2011   Procedure: LAPAROSCOPIC CHOLECYSTECTOMY;  Surgeon: Dalia Heading, MD;  Location: [facility] OR[facility];  Service: General;  Laterality: N/A;   COLONOSCOPY  08/2009   Dr. Elnoria Howard: sessile polyp (benign lymphoid), large hemorrhoids, repeat 5-10 years   COLONOSCOPY N/A 06/12/2016   prominent hemorrhoids   COLONOSCOPY WITH PROPOFOL  N/A 06/16/2021   Procedure: COLONOSCOPY WITH PROPOFOL;  Surgeon: Corbin Ade, MD;  [facility];  Service: Endoscopy;  Laterality: N/A;  9:30am (dialysis pt)   ESOPHAGOGASTRODUODENOSCOPY  09/05/2011   [id number] hiatal hernia; remainder of exam normal. No explanation for patient's abdominal pain with today's examination   ESOPHAGOGASTRODUODENOSCOPY N/A 12/17/2013   Dr. Jena Gauss: gastric erythema, erosion, mild chronic inflammation on path    ESOPHAGOGASTRODUODENOSCOPY (EGD) WITH PROPOFOL N/A 06/16/2021   Procedure: ESOPHAGOGASTRODUODENOSCOPY (EGD) WITH PROPOFOL;  Surgeon: Corbin Ade, MD;  [facility];  Service: Endoscopy;  Laterality: N/A;   ESOPHAGOGASTRODUODENOSCOPY (EGD) WITH PROPOFOL N/A 10/04/2023   Procedure: ESOPHAGOGASTRODUODENOSCOPY (EGD) WITH PROPOFOL;  Surgeon: Corbin Ade, MD;  [facility];  Service: Endoscopy;  Laterality: N/A;  200pm, ok rm 1-2, pt knows to arrive at 6:45   EXCISION OF BREAST BIOPSY Right 10/12/2020   Procedure: EXCISION OF RIGHT BREAST BIOPSY;  Surgeon: Franky Macho, MD;  Location: [facility];  Service: General;  Laterality: Right;   IR DIALY SHUNT INTRO NEEDLE/INTRACATH INITIAL W/IMG LEFT Left 07/17/2023   LAPAROSCOPIC APPENDECTOMY  09/29/2011   Procedure: APPENDECTOMY LAPAROSCOPIC;  Surgeon: Dalia Heading, MD;  Location: [facility];  Service: General;;  incidental appendectomy   LEFT HEART CATH AND CORONARY ANGIOGRAPHY N/A 02/28/2019   Procedure: LEFT HEART CATH AND CORONARY ANGIOGRAPHY;  Surgeon: Corky Crafts, MD;  Location: [facility] INVASIVE CV LAB;  Service: Cardiovascular;  Laterality: N/A;   MASS EXCISION Right 01/18/2023   Procedure: EXCISION MASS, RIGHT CHEST S/P MASTECTOMY;  Surgeon: Franky Macho, MD;  Location: [facility];  Service: General;  Laterality: Right;   MASTECTOMY MODIFIED RADICAL Right 02/18/2020   Procedure: MASTECTOMY MODIFIED RADICAL;  Surgeon: Franky Macho, MD;  Location: [facility];  Service: General;   Laterality: Right;   MASTECTOMY, PARTIAL Right 07/13/2020   Procedure: RIGHT PARTIAL MASTECTOMY;  Surgeon: Franky Macho, MD;  Location: [facility];  Service: General;  Laterality: Right;   PARTIAL MASTECTOMY WITH NEEDLE LOCALIZATION AND AXILLARY SENTINEL LYMPH NODE BX Right 09/18/2018   Procedure: RIGHT PARTIAL MASTECTOMY AFTER NEEDLE LOCALIZATION, SENTINEL LYMPH NODE BIOPSY RIGHT AXILLA;  Surgeon: Franky Macho, MD;  Location: [facility];  Service: General;  Laterality: Right;   POLYPECTOMY  06/16/2021   Procedure: POLYPECTOMY;  Surgeon: Corbin Ade, MD;  [facility];  Service: Endoscopy;;   PORTACATH PLACEMENT Right 06/07/2023   Procedure: INSERTION PORT-A-CATH, RIGHT  (DIALYSIS ACCESS ON LEFT);  Surgeon: Franky Macho, MD;  Location: [facility];  Service: General;  Laterality: Right;   SIMPLE MASTECTOMY WITH AXILLARY SENTINEL NODE BIOPSY Left 06/15/2020   Procedure: LEFT SIMPLE MASTECTOMY;  Surgeon: Franky Macho, MD;  Location: [facility];  Service: General;  Laterality: Left;    Current Outpatient Medications  Medication Sig Dispense Refill   acetaminophen (TYLENOL) 500 MG tablet Take 500-1,000 mg by mouth every 6 (six) hours  as needed (pain.).     amLODipine (NORVASC) 10 MG tablet TAKE 1 TABLET BY MOUTH AT BEDTIME 90 tablet 3   anastrozole (ARIMIDEX) 1 MG tablet TAKE 1 TABLET BY MOUTH DAILY 90 tablet 3   aspirin EC 81 MG tablet Take 81 mg by mouth in the morning.     atorvastatin (LIPITOR) 20 MG tablet TAKE 1 TABLET(20 MG) BY MOUTH DAILY 90 tablet 0   B Complex-C-Zn-Folic Acid (DIALYVITE 800-ZINC 15) 0.8 MG TABS Take 1 tablet by mouth in the morning.     cinacalcet (SENSIPAR) 30 MG tablet Take 2 tablets (60 mg total) by mouth daily. (Patient taking differently: Take 90 mg by mouth daily with breakfast. With a meal) 90 tablet 0   Darbepoetin Alfa (ARANESP, ALBUMIN FREE, IJ) Darbepoetin Alfa (Aranesp)     famotidine (PEPCID) 40 MG tablet Take 1 tablet (40 mg total) by mouth daily. 30  tablet 3   fluticasone (FLONASE) 50 MCG/ACT nasal spray Place 2 sprays into both nostrils daily. 16 g 6   gabapentin (NEURONTIN) 300 MG capsule TAKE 1 CAPSULE(300 MG) BY MOUTH AT BEDTIME 90 capsule 3   glipiZIDE (GLUCOTROL XL) 5 MG 24 hr tablet Take 1 tablet by mouth daily.     hydrALAZINE (APRESOLINE) 25 MG tablet Take by mouth.     iron sucrose (VENOFER) 20 MG/ML injection Inject 50 mg into the vein once a week.     lactulose (CHRONULAC) 10 GM/15ML solution Take by mouth.     lanthanum (FOSRENOL) 1000 MG chewable tablet Chew 2,000-3,000 mg by mouth See admin instructions. Take 3 tablets (3000 mg) by mouth with meals and take 2 tablets (2000 mg) with snacks     lidocaine-prilocaine (EMLA) cream APPLY TOPICALLY TO THE AFFECTED AREA 1 TIME 30 g 3   losartan (COZAAR) 100 MG tablet Take by mouth.     meclizine (ANTIVERT) 25 MG tablet Take 1 tablet (25 mg total) by mouth 3 (three) times daily as needed for dizziness. 30 tablet 1   nebivolol (BYSTOLIC) 10 MG tablet TAKE 1 TABLET BY MOUTH IN THE MORNING AND AT BEDTIME 180 tablet 0   nitroGLYCERIN (NITROSTAT) 0.4 MG SL tablet Place 1 tablet (0.4 mg total) under the tongue every 5 (five) minutes x 3 doses as needed for chest pain (if no relief after 3rd dose, proceed to ED or call 911). 25 tablet 3   ondansetron (ZOFRAN) 4 MG tablet Take 2 tablets (8 mg total) by mouth every 8 (eight) hours as needed for nausea or vomiting. 90 tablet 0   oxyCODONE (ROXICODONE) 5 MG immediate release tablet Take 1 tablet (5 mg total) by mouth every 6 (six) hours as needed. 30 tablet 0   pantoprazole (PROTONIX) 40 MG tablet Take 1 tablet (40 mg total) by mouth 2 (two) times daily before a meal. 60 tablet 11   prochlorperazine (COMPAZINE) 10 MG tablet Take 1 tablet (10 mg total) by mouth every 6 (six) hours as needed for nausea or vomiting. 60 tablet 3   sucralfate (CARAFATE) 1 g tablet Take 1 tablet (1 g total) by mouth 4 (four) times daily -  before meals and at bedtime.  Crush in small amount of water and mix. 120 tablet 1   VITAMIN D PO Take 0.25 mcg by mouth daily.     cephALEXin (KEFLEX) 500 MG capsule Take 1 capsule every 6 hours by oral route for 10 days. (Patient not taking: Reported on 10/11/2023)     No current facility-administered  medications for this visit.    Allergies as of 10/11/2023 - Review Complete 10/11/2023  Allergen Reaction Noted   Ibuprofen Other (See Comments) 06/25/2023    Family History  Problem Relation Age of Onset   Hypertension Mother    Coronary artery disease Mother    Diabetes Mother    Hypertension Sister    Coronary artery disease Sister    Hypertension Brother    Heart attack Father    Hypertension Son    Heart attack Maternal Aunt    Hypertension Maternal Aunt    Diabetes Maternal Aunt    Heart attack Maternal Uncle    Hypertension Maternal Uncle    Diabetes Maternal Uncle    Heart attack Paternal Aunt    Hypertension Paternal Aunt    Diabetes Paternal Aunt    Heart attack Paternal Uncle    Hypertension Paternal Uncle    Diabetes Paternal Uncle    Heart attack Maternal Grandmother    Heart attack Maternal Grandfather    Heart attack Paternal Grandmother    Heart attack Paternal Grandfather    Colon cancer Neg Hx     Social History   Socioeconomic History   Marital status: Married    Spouse name: Not on file   Number of children: Not on file   Years of education: Not on file   Highest education level: 12th grade  Occupational History   Occupation: Systems developer: [employer]  Tobacco Use   Smoking status: Never    Passive exposure: Never   Smokeless tobacco: Never  Vaping Use   Vaping status: Never Used  Substance and Sexual Activity   Alcohol use: No   Drug use: No   Sexual activity: Yes    Birth control/protection: Surgical  Other Topics Concern   Not on file  Social History Narrative   Works at [employer] in [place].    When trucks come, she has to put items in their  places.   Also has to get items from high shelves-causes achy pain in shoulder area      Married.   Children are grown, out of house.   Social Drivers of Corporate investment banker Strain: Low Risk  (09/20/2023)   Overall Financial Resource Strain (CARDIA)    Difficulty of Paying Living Expenses: Not hard at all  Food Insecurity: No Food Insecurity (09/20/2023)   Hunger Vital Sign    Worried About Running Out of Food in the Last Year: Never true    Ran Out of Food in the Last Year: Never true  Transportation Needs: No Transportation Needs (09/20/2023)   PRAPARE - Administrator, Civil Service (Medical): No    Lack of Transportation (Non-Medical): No  Physical Activity: Inactive (09/20/2023)   Exercise Vital Sign    Days of Exercise per Week: 0 days    Minutes of Exercise per Session: 0 min  Stress: No Stress Concern Present (09/20/2023)   [employer] of Occupational Health - Occupational Stress Questionnaire    Feeling of Stress : Not at all  Social Connections: Moderately Integrated (09/20/2023)   Social Connection and Isolation Panel [NHANES]    Frequency of Communication with Friends and Family: More than three times a week    Frequency of Social Gatherings with Friends and Family: Three times a week    Attends Religious Services: More than 4 times per year    Active Member of Clubs or Organizations: No  Attends Banker Meetings: Never    Marital Status: Married  Catering manager Violence: Not At Risk (09/20/2023)   Humiliation, Afraid, Rape, and Kick questionnaire    Fear of Current or Ex-Partner: No    Emotionally Abused: No    Physically Abused: No    Sexually Abused: No     Review of Systems   Gen: Denies any fever, chills, fatigue, weight loss, lack of appetite.  CV: Denies chest pain, heart palpitations, peripheral edema, syncope.  Resp: Denies shortness of breath at rest or with exertion. Denies wheezing or cough.  GI: Denies dysphagia or  odynophagia. Denies jaundice, hematemesis, fecal incontinence. GU : Denies urinary burning, urinary frequency, urinary hesitancy MS: Denies joint pain, muscle weakness, cramps, or limitation of movement.  Derm: Denies rash, itching, dry skin Psych: Denies depression, anxiety, memory loss, and confusion Heme: Denies bruising, bleeding, and enlarged lymph nodes.   Physical Exam   BP (!) 93/58   Pulse 77   Temp 98.2 F (36.8 C)   Ht 5' 1.5" (1.562 m)   Wt 170 lb 9.6 oz (77.4 kg)   BMI 31.71 kg/m  General:   Alert and oriented. Pleasant and cooperative. Well-nourished and well-developed.  Head:  Normocephalic and atraumatic. Eyes:  Without icterus Rectal:  perianal right anterior cyst healing s/p lancing previously, external hemorrhoid tags, DRE without mass, no prolapsing hemorrhoids on exam Msk:  Symmetrical without gross deformities. Normal posture. Extremities:  Without edema. Neurologic:  Alert and  oriented x4;  grossly normal neurologically. Skin:  Intact without significant lesions or rashes. Psych:  Alert and cooperative. Normal mood and affect     Assessment   Megan [name] is a 64 y.o. female presenting today with a history of  internal hemorrhoids s/p banding in 2021 and April/May 2023, Cdiff in July 2022, anemia, HFpEF, breast cancer, GERD, HTN, ESRD on dialysis, CAD, HLD, and diabetes, recently with new onset dyspepsia s/p EGD, and persistent rectal bleeding in setting of known hemorrhoids.   Nodular antral mucosa concerning for neoplasia with spontaneous oozing was noted s/p biopsy and hemospray; however, no malignancy on path and consistent with reactive gastritis. PET scan Oct 2024 with intense metabolic activity in gastric antrum that was considered inflammatory. She has noted improvement in dyspepsia. Will discuss further with Dr. Jena Gauss if EUS or early interval repeat EGD would be favored in light of abnormal gastric mucosa.   Rectal bleeding: in setting of  known large internal hemorrhoids. She has noted resolution of rectal bleeding with compounded cream. We will update CBC today as she does have history of chronic anemia in setting of chronic disease/IDA and suspect significant rectal bleeding is largely contributing to her worsening anemia. She actually required a unit of PRBCs on 2/13. Low threshold for updating colonoscopy as last in 2022. Discussed banding today, which she wants to hold off on currently. Due to significant of hemorrhoids, would require multiple sessions and may not be entirely effective at this time but can attempt as she continues to have bleeding.   Hypotension: declining ED evaluation. She will contact PCP regarding anti-hypertensive med adjustments. Stat CBC today.    PLAN    Stat CBC Continue compounded hemorrhoid cream BID PPI Carafate  Close follow-up in 6-8 weeks Discussing further evaluation of abnormal EGD with Dr. Jena Gauss. ?EUS vs early interval EGD for surveillance.    Gelene Mink, PhD, ANP-BC [facility]    Addendum on 3/11: discussed with Dr. Jena Gauss. Will refer for  EGD/EUS due to abnormal EGD and PET CT results.

## 2023-10-11 NOTE — Patient Instructions (Signed)
 Please have blood work done at [facility].  Continue the compounded cream as you are doing! Please call if worsening bleeding.   I would like to see you in 6-8 weeks!  Please talk to Dr. Tanya Nones about your blood pressure medications.    I enjoyed seeing you again today! I value our relationship and want to provide genuine, compassionate, and quality care. You may receive a survey regarding your visit with me, and I welcome your feedback! Thanks so much for taking the time to complete this. I look forward to seeing you again.      Gelene Mink, PhD, ANP-BC [facility]astroenter[facility]

## 2023-10-12 ENCOUNTER — Other Ambulatory Visit: Payer: Self-pay

## 2023-10-12 DIAGNOSIS — N2581 Secondary hyperparathyroidism of renal origin: Secondary | ICD-10-CM | POA: Diagnosis not present

## 2023-10-12 DIAGNOSIS — E119 Type 2 diabetes mellitus without complications: Secondary | ICD-10-CM | POA: Diagnosis not present

## 2023-10-12 DIAGNOSIS — Z992 Dependence on renal dialysis: Secondary | ICD-10-CM | POA: Diagnosis not present

## 2023-10-12 DIAGNOSIS — N186 End stage renal disease: Secondary | ICD-10-CM | POA: Diagnosis not present

## 2023-10-12 DIAGNOSIS — E1129 Type 2 diabetes mellitus with other diabetic kidney complication: Secondary | ICD-10-CM | POA: Diagnosis not present

## 2023-10-12 DIAGNOSIS — D631 Anemia in chronic kidney disease: Secondary | ICD-10-CM | POA: Diagnosis not present

## 2023-10-13 DIAGNOSIS — N040 Nephrotic syndrome with minor glomerular abnormality: Secondary | ICD-10-CM | POA: Diagnosis not present

## 2023-10-13 DIAGNOSIS — Z992 Dependence on renal dialysis: Secondary | ICD-10-CM | POA: Diagnosis not present

## 2023-10-13 DIAGNOSIS — N186 End stage renal disease: Secondary | ICD-10-CM | POA: Diagnosis not present

## 2023-10-14 ENCOUNTER — Other Ambulatory Visit: Payer: Self-pay

## 2023-10-14 ENCOUNTER — Emergency Department (HOSPITAL_COMMUNITY)
Admission: EM | Admit: 2023-10-14 | Discharge: 2023-10-14 | Disposition: A | Discharge status: Home / Self Care | Attending: Emergency Medicine | Admitting: Emergency Medicine

## 2023-10-14 ENCOUNTER — Encounter (HOSPITAL_COMMUNITY): Payer: Self-pay | Admitting: *Deleted

## 2023-10-14 DIAGNOSIS — Z7982 Long term (current) use of aspirin: Secondary | ICD-10-CM | POA: Insufficient documentation

## 2023-10-14 DIAGNOSIS — D630 Anemia in neoplastic disease: Secondary | ICD-10-CM | POA: Insufficient documentation

## 2023-10-14 DIAGNOSIS — D649 Anemia, unspecified: Secondary | ICD-10-CM | POA: Diagnosis present

## 2023-10-14 DIAGNOSIS — D500 Iron deficiency anemia secondary to blood loss (chronic): Secondary | ICD-10-CM | POA: Diagnosis not present

## 2023-10-14 DIAGNOSIS — R42 Dizziness and giddiness: Secondary | ICD-10-CM | POA: Diagnosis not present

## 2023-10-14 DIAGNOSIS — I10 Essential (primary) hypertension: Secondary | ICD-10-CM | POA: Diagnosis not present

## 2023-10-14 DIAGNOSIS — D509 Iron deficiency anemia, unspecified: Secondary | ICD-10-CM | POA: Diagnosis not present

## 2023-10-14 LAB — CBC WITH DIFFERENTIAL/PLATELET
Abs Immature Granulocytes: 0.02 10*3/uL (ref 0.00–0.07)
Basophils Absolute: 0.1 10*3/uL (ref 0.0–0.1)
Basophils Relative: 1 %
Eosinophils Absolute: 0 10*3/uL (ref 0.0–0.5)
Eosinophils Relative: 0 %
HCT: 20.4 % — ABNORMAL LOW (ref 36.0–46.0)
Hemoglobin: 6.6 g/dL — CL (ref 12.0–15.0)
Immature Granulocytes: 0 %
Lymphocytes Relative: 15 %
Lymphs Abs: 0.8 10*3/uL (ref 0.7–4.0)
MCH: 35.9 pg — ABNORMAL HIGH (ref 26.0–34.0)
MCHC: 32.4 g/dL (ref 30.0–36.0)
MCV: 110.9 fL — ABNORMAL HIGH (ref 80.0–100.0)
Monocytes Absolute: 0.6 10*3/uL (ref 0.1–1.0)
Monocytes Relative: 12 %
Neutro Abs: 3.7 10*3/uL (ref 1.7–7.7)
Neutrophils Relative %: 72 %
Platelets: 117 10*3/uL — ABNORMAL LOW (ref 150–400)
RBC: 1.84 MIL/uL — ABNORMAL LOW (ref 3.87–5.11)
WBC: 5.1 10*3/uL (ref 4.0–10.5)
nRBC: 0 % (ref 0.0–0.2)

## 2023-10-14 LAB — COMPREHENSIVE METABOLIC PANEL
ALT: 19 U/L (ref 0–44)
AST: 38 U/L (ref 15–41)
Albumin: 3.2 g/dL — ABNORMAL LOW (ref 3.5–5.0)
Alkaline Phosphatase: 125 U/L (ref 38–126)
Anion gap: 15 (ref 5–15)
BUN: 34 mg/dL — ABNORMAL HIGH (ref 8–23)
CO2: 25 mmol/L (ref 22–32)
Calcium: 7.9 mg/dL — ABNORMAL LOW (ref 8.9–10.3)
Chloride: 94 mmol/L — ABNORMAL LOW (ref 98–111)
Creatinine, Ser: 11.44 mg/dL — ABNORMAL HIGH (ref 0.44–1.00)
GFR, Estimated: 3 mL/min — ABNORMAL LOW (ref >60)
Glucose, Bld: 105 mg/dL — ABNORMAL HIGH (ref 70–99)
Potassium: 3.8 mmol/L (ref 3.5–5.1)
Sodium: 134 mmol/L — ABNORMAL LOW (ref 135–145)
Total Bilirubin: 1 mg/dL (ref 0.0–1.2)
Total Protein: 6.4 g/dL — ABNORMAL LOW (ref 6.5–8.1)

## 2023-10-14 LAB — PREPARE RBC (CROSSMATCH)

## 2023-10-14 MED ORDER — SODIUM CHLORIDE 0.9% IV SOLUTION: Freq: Once | INTRAVENOUS | Status: AC

## 2023-10-14 MED ORDER — SODIUM CHLORIDE 0.9 % IV BOLUS
250.0000 mL | Freq: Once | INTRAVENOUS | Status: AC
Start: 2023-10-14 — End: 2023-10-14
  Administered 2023-10-14: 250 mL via INTRAVENOUS

## 2023-10-14 NOTE — ED Notes (Signed)
 ED Provider at bedside.

## 2023-10-14 NOTE — Discharge Instructions (Signed)
 Contact your gastroenterologist and let them know that he came to the emergency department and have been transfused 1 unit

## 2023-10-14 NOTE — ED Provider Notes (Signed)
 [place] EMERGENCY DEPARTMENT AT [facility] Provider Note   CSN: [id number] Arrival date & time: 10/14/23  1100     History {Add pertinent medical, surgical, social history, OB history to HPI:1} Chief Complaint  Patient presents with   Hypotension    Megan [name] is a 63 y.o. female.  Patient has history of renal failure and anemia.  She also has chronic internal hemorrhoids bleeding.  Patient presents to the emergency department mildly weak with low blood pressure.  Patient's blood pressure improved when she arrived to the emergency department.   Weakness      Home Medications Prior to Admission medications   Medication Sig Start Date End Date Taking? Authorizing Provider  amLODipine (NORVASC) 10 MG tablet TAKE 1 TABLET BY MOUTH AT BEDTIME 08/07/23  Yes Donita Brooks, MD  aspirin EC 81 MG tablet Take 81 mg by mouth daily.   Yes [provider]  atorvastatin (LIPITOR) 20 MG tablet TAKE 1 TABLET(20 MG) BY MOUTH DAILY 08/13/23  Yes Donita Brooks, MD  B Complex-C-Zn-Folic Acid (DIALYVITE 800-ZINC 15) 0.8 MG TABS Take 1 tablet by mouth daily. 06/19/22  Yes [provider]  cinacalcet (SENSIPAR) 60 MG tablet Take 60 mg by mouth daily.   Yes [provider]  Darbepoetin Alfa (ARANESP, ALBUMIN FREE, IJ) Darbepoetin Alfa (Aranesp) 07/06/23 07/04/24 Yes [provider]  famotidine (PEPCID) 40 MG tablet Take 1 tablet (40 mg total) by mouth daily. 10/05/23  Yes Donita Brooks, MD  fluticasone (FLONASE) 50 MCG/ACT nasal spray Place 2 sprays into both nostrils daily. Patient taking differently: Place 2 sprays into both nostrils daily as needed for allergies or rhinitis. 10/20/22  Yes Donita Brooks, MD  gabapentin (NEURONTIN) 300 MG capsule TAKE 1 CAPSULE(300 MG) BY MOUTH AT BEDTIME 06/12/23  Yes Donita Brooks, MD  iron sucrose (VENOFER) 20 MG/ML injection Inject 50 mg into the vein once a week. 02/19/23 02/18/24 Yes [provider]  lanthanum (FOSRENOL) 1000 MG chewable tablet Chew 2,000-3,000 mg by mouth See admin instructions. Take 3 tablets (3000 mg) by mouth with meals and take 2 tablets (2000 mg) with snacks 10/16/19  Yes [provider]  lidocaine-prilocaine (EMLA) cream APPLY TOPICALLY TO THE AFFECTED AREA 1 TIME 10/01/23  Yes [name], MD  meclizine (ANTIVERT) 25 MG tablet Take 1 tablet (25 mg total) by mouth 3 (three) times daily as needed for dizziness. 06/12/23  Yes Donita Brooks, MD  nebivolol (BYSTOLIC) 10 MG tablet TAKE 1 TABLET BY MOUTH IN THE MORNING AND AT BEDTIME 08/17/23  Yes Donita Brooks, MD  nitroGLYCERIN (NITROSTAT) 0.4 MG SL tablet Place 1 tablet (0.4 mg total) under the tongue every 5 (five) minutes x 3 doses as needed for chest pain (if no relief after 3rd dose, proceed to ED or call 911). 09/13/22  Yes Branch, [name], MD  oxyCODONE (ROXICODONE) 5 MG immediate release tablet Take 1 tablet (5 mg total) by mouth every 6 (six) hours as needed. 06/07/23 06/06/24 Yes Franky Macho, MD  pantoprazole (PROTONIX) 40 MG tablet Take 1 tablet (40 mg total) by mouth 2 (two) times daily before a meal. 10/08/23  Yes Rourk, Gerrit Friends, MD  sucralfate (CARAFATE) 1 g tablet Take 1 tablet (1 g total) by mouth 4 (four) times daily -  before meals and at bedtime. Crush in small amount of water and mix. 09/20/23  Yes Gelene Mink, NP  sulfamethoxazole-trimethoprim (BACTRIM DS) 800-160 MG tablet Take  1 tablet by mouth 2 (two) times daily.   Yes [provider]      Allergies    Motrin [ibuprofen]    Review of Systems   Review of Systems  Neurological:  Positive for weakness.    Physical Exam Updated Vital Signs BP 123/67 (BP Location: Right Arm)   Pulse 80   Temp 98.8 F (37.1 C) (Oral)   Resp 17   Ht 5' 1.5" (1.562 m)   Wt 77.1 kg   SpO2 99%   BMI 31.60 kg/m  Physical Exam  ED Results / Procedures / Treatments   Labs (all labs ordered are listed, but only  abnormal results are displayed) Labs Reviewed  CBC WITH DIFFERENTIAL/PLATELET - Abnormal; Notable for the following components:      Result Value   RBC 1.84 (*)    Hemoglobin 6.6 (*)    HCT 20.4 (*)    MCV 110.9 (*)    MCH 35.9 (*)    Platelets 117 (*)    All other components within normal limits  COMPREHENSIVE METABOLIC PANEL - Abnormal; Notable for the following components:   Sodium 134 (*)    Chloride 94 (*)    Glucose, Bld 105 (*)    BUN 34 (*)    Creatinine, Ser 11.44 (*)    Calcium 7.9 (*)    Total Protein 6.4 (*)    Albumin 3.2 (*)    GFR, Estimated 3 (*)    All other components within normal limits  TYPE AND SCREEN  PREPARE RBC (CROSSMATCH)    EKG None  Radiology No results found.  Procedures Procedures  {Document cardiac monitor, telemetry assessment procedure when appropriate:1}  Medications Ordered in ED Medications  sodium chloride 0.9 % bolus 250 mL (0 mLs Intravenous Stopped 10/14/23 1215)  0.9 %  sodium chloride infusion (Manually program via Guardrails IV Fluids) ( Intravenous New Bag/Given 10/14/23 1351)    ED Course/ Medical Decision Making/ A&P   {Patient with mild anemia secondary to chronic blood loss.  She has been transfused 1 unit of blood and will get her dialysis tomorrow Click here for ABCD2, HEART and other calculatorsREFRESH Note before signing :1}                              Medical Decision Making Amount and/or Complexity of Data Reviewed Labs: ordered. ECG/medicine tests: ordered.  Risk Prescription drug management.   Anemia secondary to chronic blood loss and anemia of chronic disease.  Patient transfused 1 unit of blood.  She will follow-up with GI and get dialysis tomorrow  {Document critical care time when appropriate:1} {Document review of labs and clinical decision tools ie heart score, Chads2Vasc2 etc:1}  {Document your independent review of radiology images, and any outside records:1} {Document your discussion with  family members, caretakers, and with consultants:1} {Document social determinants of health affecting pt's care:1} {Document your decision making why or why not admission, treatments were needed:1} Final Clinical Impression(s) / ED Diagnoses Final diagnoses:  Iron deficiency anemia due to chronic blood loss    Rx / DC Orders ED Discharge Orders     None

## 2023-10-14 NOTE — ED Triage Notes (Signed)
 Pt with hypotension, SBP in the 80's.  Pt with some dizziness, hx of vertigo. Mid CP  Pt has not taken her HTN med since Friday.

## 2023-10-15 DIAGNOSIS — N186 End stage renal disease: Secondary | ICD-10-CM | POA: Diagnosis not present

## 2023-10-15 DIAGNOSIS — Z992 Dependence on renal dialysis: Secondary | ICD-10-CM | POA: Diagnosis not present

## 2023-10-15 DIAGNOSIS — D631 Anemia in chronic kidney disease: Secondary | ICD-10-CM | POA: Diagnosis not present

## 2023-10-15 DIAGNOSIS — E8770 Fluid overload, unspecified: Secondary | ICD-10-CM | POA: Diagnosis not present

## 2023-10-15 DIAGNOSIS — N2581 Secondary hyperparathyroidism of renal origin: Secondary | ICD-10-CM | POA: Diagnosis not present

## 2023-10-15 LAB — BPAM RBC
Blood Product Expiration Date: 202503112359
ISSUE DATE / TIME: 202503021333
Unit Type and Rh: 9500

## 2023-10-15 LAB — TYPE AND SCREEN
ABO/RH(D): B POS
Antibody Screen: NEGATIVE
Unit division: 0

## 2023-10-16 DIAGNOSIS — N764 Abscess of vulva: Secondary | ICD-10-CM | POA: Diagnosis not present

## 2023-10-16 NOTE — Telephone Encounter (Signed)
 Copied from CRM [id number]. Topic: Clinical - Medical Advice >> Oct 16, 2023 10:18 AM Fuller Mandril wrote: Reason for CRM: Patient called request to speak with [name]. Read note as written on 2/27 regarding gabapetin. Patient understood but has further questions. Also, would like to know what provider would like to do about blood pressure medication since pressure has been lower then normal. Patient was seen at ED 3/2. States since ED has been 111 116 and 104. She does not know if she should continue medication. Thank You

## 2023-10-17 DIAGNOSIS — E8770 Fluid overload, unspecified: Secondary | ICD-10-CM | POA: Diagnosis not present

## 2023-10-17 DIAGNOSIS — Z992 Dependence on renal dialysis: Secondary | ICD-10-CM | POA: Diagnosis not present

## 2023-10-17 DIAGNOSIS — N186 End stage renal disease: Secondary | ICD-10-CM | POA: Diagnosis not present

## 2023-10-17 DIAGNOSIS — N2581 Secondary hyperparathyroidism of renal origin: Secondary | ICD-10-CM | POA: Diagnosis not present

## 2023-10-17 DIAGNOSIS — D631 Anemia in chronic kidney disease: Secondary | ICD-10-CM | POA: Diagnosis not present

## 2023-10-18 ENCOUNTER — Telehealth: Payer: Self-pay | Admitting: *Deleted

## 2023-10-18 ENCOUNTER — Encounter: Payer: Self-pay | Admitting: *Deleted

## 2023-10-18 ENCOUNTER — Other Ambulatory Visit: Payer: Self-pay | Admitting: *Deleted

## 2023-10-18 DIAGNOSIS — L0231 Cutaneous abscess of buttock: Secondary | ICD-10-CM

## 2023-10-18 NOTE — Telephone Encounter (Signed)
 Received notes from Dr. Henderson Cloud at [facility].   Noted Dx of abscess of vulva, but drawing of area of concern noted below vulva on right side. Provider also discussed possibility of rectovulvar fistula with patient.

## 2023-10-18 NOTE — Telephone Encounter (Signed)
 Received call from patient [phone]~ telephone.    Patient reports that she has x2 weeks of abscess to buttocks. States that she has been seen at [facility] by Dr. Harold Hedge a few times. States that she has been given oral ABTx x3 courses and had I&D at that office. Reports that wound culture was obtained that showed E- Coli. Per patient, Dr. Henderson Cloud recommended that she follow up with general surgery.   Appointment scheduled with Dr. Lovell Sheehan per patient request. Notes and labs requested from Dr. Henderson Cloud.

## 2023-10-19 ENCOUNTER — Ambulatory Visit: Payer: Self-pay | Admitting: Family Medicine

## 2023-10-19 DIAGNOSIS — D631 Anemia in chronic kidney disease: Secondary | ICD-10-CM | POA: Diagnosis not present

## 2023-10-19 DIAGNOSIS — Z992 Dependence on renal dialysis: Secondary | ICD-10-CM | POA: Diagnosis not present

## 2023-10-19 DIAGNOSIS — E8770 Fluid overload, unspecified: Secondary | ICD-10-CM | POA: Diagnosis not present

## 2023-10-19 DIAGNOSIS — N2581 Secondary hyperparathyroidism of renal origin: Secondary | ICD-10-CM | POA: Diagnosis not present

## 2023-10-19 DIAGNOSIS — N186 End stage renal disease: Secondary | ICD-10-CM | POA: Diagnosis not present

## 2023-10-19 NOTE — Telephone Encounter (Signed)
  Chief Complaint: Patient states she did not hear back from [name] regarding her BP medication questions  Additional Notes: Read this note from Dr Tanya Nones in patient's chart from 10/16/23: "If your bp is that low off the medication I agree you should stay off of it because those numbers are low normal and do not require additional medication ". Patient states her BP this morning at dialysis was 118/66 and 99/65. Denies any symptoms at this time and verbalizes understanding of Dr Caren Macadam message.  Copied from CRM [id number]. Topic: Clinical - Red Word Triage >> Oct 19, 2023  8:34 AM Marland Kitchen D wrote: Patient called in due to low blood pressure and states she called and was told they would get back to her about it. It's been 2 weeks and she hasn't taken her medicine. 118/66, 99/65 she said it's been up and down. Patient went to ER for blood transfusion. Reason for Disposition . [1] Follow-up call to recent contact AND [2] information only call, no triage required  Protocols used: Information Only Call - No Triage-A-AH

## 2023-10-22 DIAGNOSIS — E8770 Fluid overload, unspecified: Secondary | ICD-10-CM | POA: Diagnosis not present

## 2023-10-22 DIAGNOSIS — N2581 Secondary hyperparathyroidism of renal origin: Secondary | ICD-10-CM | POA: Diagnosis not present

## 2023-10-22 DIAGNOSIS — Z992 Dependence on renal dialysis: Secondary | ICD-10-CM | POA: Diagnosis not present

## 2023-10-22 DIAGNOSIS — D631 Anemia in chronic kidney disease: Secondary | ICD-10-CM | POA: Diagnosis not present

## 2023-10-22 DIAGNOSIS — N186 End stage renal disease: Secondary | ICD-10-CM | POA: Diagnosis not present

## 2023-10-23 ENCOUNTER — Encounter: Payer: Self-pay | Admitting: General Surgery

## 2023-10-23 ENCOUNTER — Ambulatory Visit (INDEPENDENT_AMBULATORY_CARE_PROVIDER_SITE_OTHER): Admitting: General Surgery

## 2023-10-23 ENCOUNTER — Telehealth: Payer: Self-pay | Admitting: Gastroenterology

## 2023-10-23 VITALS — BP 100/61 | HR 94 | Temp 98.3°F | Resp 12 | Ht 61.5 in | Wt 173.0 lb

## 2023-10-23 DIAGNOSIS — K603 Anal fistula, unspecified: Secondary | ICD-10-CM | POA: Diagnosis not present

## 2023-10-23 DIAGNOSIS — R9389 Abnormal findings on diagnostic imaging of other specified body structures: Secondary | ICD-10-CM

## 2023-10-23 DIAGNOSIS — N2581 Secondary hyperparathyroidism of renal origin: Secondary | ICD-10-CM | POA: Diagnosis not present

## 2023-10-23 DIAGNOSIS — E8770 Fluid overload, unspecified: Secondary | ICD-10-CM | POA: Diagnosis not present

## 2023-10-23 DIAGNOSIS — N186 End stage renal disease: Secondary | ICD-10-CM | POA: Diagnosis not present

## 2023-10-23 DIAGNOSIS — Z992 Dependence on renal dialysis: Secondary | ICD-10-CM | POA: Diagnosis not present

## 2023-10-23 DIAGNOSIS — D631 Anemia in chronic kidney disease: Secondary | ICD-10-CM | POA: Diagnosis not present

## 2023-10-23 NOTE — Progress Notes (Signed)
 Megan [name]; [id number]; 19-Oct-1960   HPI Patient is a 63 year old black female well-known to me who was referred to my care by Dr. 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  Patient was seen in his office in March 2025 for an abscess of the vulva.  This was drained in his office.  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  Patient denies any stool or air coming through the opening.  She was placed on Bactrim as cultures were positive for E. coli from the vulvar abscess.  She states the drainage has decreased.  She denies any fever or chills. Past Medical History:  Diagnosis Date   (HFpEF) heart failure with preserved ejection fraction (HCC)    a. 01/2019 Echo: EF 55-60%, mild conc LVH. DD.  Torn MV chordae.   Anemia    Atypical chest pain    a. 08/2018 MV: EF 59%, no ischemia; b. 02/2019 Cath: nonobs dzs.   Blood transfusion without reported diagnosis    Breast cancer (HCC) 10/12/2020   Cataract    ESRD (end stage renal disease) on dialysis [facility])    a. HD T, T, S   Essential hypertension, benign    GERD (gastroesophageal reflux disease)    Headache    Hemorrhoids    Mixed hyperlipidemia    Morbid obesity (HCC)    Non-obstructive CAD (coronary artery disease)    a. 02/2019 CathL LM nl, LAD 69m, LCX nl, RCA 25p, EF 55-65%.   PONV (postoperative nausea and vomiting)    Renal insufficiency    S/P colonoscopy 08/2009   Dr. Elnoria Howard: sessile polyp (benign lymphoid), large hemorrhoids, repeat 5-10 years   Temporal arteritis (HCC)    Type 2 diabetes mellitus (HCC)    Wears glasses     Past Surgical History:  Procedure Laterality Date   ABDOMINAL HYSTERECTOMY     APPENDECTOMY     ARTERY BIOPSY N/A 05/09/2018   Procedure: RIGHT TEMPORAL ARTERY BIOPSY;  Surgeon: Jimmye Norman, MD;  Location: [facility] OR;  Service: General;  Laterality: N/A;   BIOPSY  10/04/2023   Procedure: BIOPSY;  Surgeon: Corbin Ade, MD;  Location: [facility]  ENDO SUITE;  Service: Endoscopy;;   BREAST BIOPSY Right 06/15/2020   Procedure: RIGHT BREAST BIOPSY;  Surgeon: Franky Macho, MD;  Location: [facility] ORS;  Service: General;  Laterality: Right;   CATARACT EXTRACTION W/PHACO Left 02/09/2017   Procedure: CATARACT EXTRACTION PHACO AND INTRAOCULAR LENS PLACEMENT LEFT EYE;  Surgeon: Gemma Payor, MD;  Location: [facility] ORS;  Service: Ophthalmology;  Laterality: Left;  CDE: 4.89   CATARACT EXTRACTION W/PHACO Right 06/04/2017   Procedure: CATARACT EXTRACTION PHACO AND INTRAOCULAR LENS PLACEMENT (IOC);  Surgeon: Gemma Payor, MD;  Location: [facility] ORS;  Service: Ophthalmology;  Laterality: Right;  CDE: 4.12   CHOLECYSTECTOMY  09/29/2011   Procedure: LAPAROSCOPIC CHOLECYSTECTOMY;  Surgeon: Dalia Heading, MD;  Location: [facility] ORS;  Service: General;  Laterality: N/A;   COLONOSCOPY  08/2009   Dr. Elnoria Howard: sessile polyp (benign lymphoid), large hemorrhoids, repeat 5-10 years   COLONOSCOPY N/A 06/12/2016   prominent hemorrhoids   COLONOSCOPY WITH PROPOFOL N/A 06/16/2021   Procedure: COLONOSCOPY WITH PROPOFOL;  Surgeon: Corbin Ade, MD;  Location: [facility] ENDO SUITE;  Service: Endoscopy;  Laterality: N/A;  9:30am (dialysis pt)   ESOPHAGOGASTRODUODENOSCOPY  09/05/2011   [id number] hiatal hernia; remainder of exam normal. No explanation for patient's abdominal pain with today's examination   ESOPHAGOGASTRODUODENOSCOPY N/A  12/17/2013   Dr. Jena Gauss: gastric erythema, erosion, mild chronic inflammation on path    ESOPHAGOGASTRODUODENOSCOPY (EGD) WITH PROPOFOL N/A 06/16/2021   Procedure: ESOPHAGOGASTRODUODENOSCOPY (EGD) WITH PROPOFOL;  Surgeon: Corbin Ade, MD;  Location: [facility] END[facility] SU[facility];  Service: Endoscopy;  Laterality: N/A;   ESOPHAGOGASTRODUODENOSCOPY (EGD) WITH PROPOFOL N/A 10/04/2023   Procedure: ESOPHAGOGASTRODUODENOSCOPY (EGD) WITH PROPOFOL;  Surgeon: Corbin Ade, MD;  Location: [facility];  Service: Endoscopy;  Laterality: N/A;  200pm, ok rm 1-2, pt knows to arrive at 6:45    EXCISION OF BREAST BIOPSY Right 10/12/2020   Procedure: EXCISION OF RIGHT BREAST BIOPSY;  Surgeon: Franky Macho, MD;  Location: [facility] ORS;  Service: General;  Laterality: Right;   IR DIALY SHUNT INTRO NEEDLE/INTRACATH INITIAL W/IMG LEFT Left 07/17/2023   LAPAROSCOPIC APPENDECTOMY  09/29/2011   Procedure: APPENDECTOMY LAPAROSCOPIC;  Surgeon: Dalia Heading, MD;  Location: [facility] ORS;  Service: General;;  incidental appendectomy   LEFT HEART CATH AND CORONARY ANGIOGRAPHY N/A 02/28/2019   Procedure: LEFT HEART CATH AND CORONARY ANGIOGRAPHY;  Surgeon: Corky Crafts, MD;  Location: [facility] INVASIVE CV LAB;  Service: Cardiovascular;  Laterality: N/A;   MASS EXCISION Right 01/18/2023   Procedure: EXCISION MASS, RIGHT CHEST S/P MASTECTOMY;  Surgeon: Franky Macho, MD;  Location: [facility] ORS;  Service: General;  Laterality: Right;   MASTECTOMY MODIFIED RADICAL Right 02/18/2020   Procedure: MASTECTOMY MODIFIED RADICAL;  Surgeon: Franky Macho, MD;  Location: [facility] ORS;  Service: General;  Laterality: Right;   MASTECTOMY, PARTIAL Right 07/13/2020   Procedure: RIGHT PARTIAL MASTECTOMY;  Surgeon: Franky Macho, MD;  Location: [facility] ORS;  Service: General;  Laterality: Right;   PARTIAL MASTECTOMY WITH NEEDLE LOCALIZATION AND AXILLARY SENTINEL LYMPH NODE BX Right 09/18/2018   Procedure: RIGHT PARTIAL MASTECTOMY AFTER NEEDLE LOCALIZATION, SENTINEL LYMPH NODE BIOPSY RIGHT AXILLA;  Surgeon: Franky Macho, MD;  Location: [facility] ORS;  Service: General;  Laterality: Right;   POLYPECTOMY  06/16/2021   Procedure: POLYPECTOMY;  Surgeon: Corbin Ade, MD;  Location: [facility];  Service: Endoscopy;;   PORTACATH PLACEMENT Right 06/07/2023   Procedure: INSERTION PORT-A-CATH, RIGHT  (DIALYSIS ACCESS ON LEFT);  Surgeon: Franky Macho, MD;  Location: [facility] ORS;  Service: General;  Laterality: Right;   SIMPLE MASTECTOMY WITH AXILLARY SENTINEL NODE BIOPSY Left 06/15/2020   Procedure: LEFT SIMPLE MASTECTOMY;  Surgeon: Franky Macho, MD;   Location: [facility] ORS;  Service: General;  Laterality: Left;    Family History  Problem Relation Age of Onset   Hypertension Mother    Coronary artery disease Mother    Diabetes Mother    Hypertension Sister    Coronary artery disease Sister    Hypertension Brother    Heart attack Father    Hypertension Son    Heart attack Maternal Aunt    Hypertension Maternal Aunt    Diabetes Maternal Aunt    Heart attack Maternal Uncle    Hypertension Maternal Uncle    Diabetes Maternal Uncle    Heart attack Paternal Aunt    Hypertension Paternal Aunt    Diabetes Paternal Aunt    Heart attack Paternal Uncle    Hypertension Paternal Uncle    Diabetes Paternal Uncle    Heart attack Maternal Grandmother    Heart attack Maternal Grandfather    Heart attack Paternal Grandmother    Heart attack Paternal Grandfather    Colon cancer Neg Hx     Current Outpatient Medications on File Prior to Visit  Medication Sig Dispense Refill  aspirin EC 81 MG tablet Take 81 mg by mouth daily.     B Complex-C-Zn-Folic Acid (DIALYVITE 800-ZINC 15) 0.8 MG TABS Take 1 tablet by mouth daily.     cinacalcet (SENSIPAR) 60 MG tablet Take 60 mg by mouth daily.     Darbepoetin Alfa (ARANESP, ALBUMIN FREE, IJ) Darbepoetin Alfa (Aranesp)     famotidine (PEPCID) 40 MG tablet Take 1 tablet (40 mg total) by mouth daily. 30 tablet 3   fluticasone (FLONASE) 50 MCG/ACT nasal spray Place 2 sprays into both nostrils daily. (Patient taking differently: Place 2 sprays into both nostrils daily as needed for allergies or rhinitis.) 16 g 6   gabapentin (NEURONTIN) 300 MG capsule TAKE 1 CAPSULE(300 MG) BY MOUTH AT BEDTIME 90 capsule 3   iron sucrose (VENOFER) 20 MG/ML injection Inject 50 mg into the vein once a week.     lanthanum (FOSRENOL) 1000 MG chewable tablet Chew 2,000-3,000 mg by mouth See admin instructions. Take 3 tablets (3000 mg) by mouth with meals and take 2 tablets (2000 mg) with snacks     lidocaine-prilocaine (EMLA) cream  APPLY TOPICALLY TO THE AFFECTED AREA 1 TIME 30 g 3   meclizine (ANTIVERT) 25 MG tablet Take 1 tablet (25 mg total) by mouth 3 (three) times daily as needed for dizziness. 30 tablet 1   nitroGLYCERIN (NITROSTAT) 0.4 MG SL tablet Place 1 tablet (0.4 mg total) under the tongue every 5 (five) minutes x 3 doses as needed for chest pain (if no relief after 3rd dose, proceed to ED or call 911). 25 tablet 3   oxyCODONE (ROXICODONE) 5 MG immediate release tablet Take 1 tablet (5 mg total) by mouth every 6 (six) hours as needed. 30 tablet 0   pantoprazole (PROTONIX) 40 MG tablet Take 1 tablet (40 mg total) by mouth 2 (two) times daily before a meal. 60 tablet 11   sucralfate (CARAFATE) 1 g tablet Take 1 tablet (1 g total) by mouth 4 (four) times daily -  before meals and at bedtime. Crush in small amount of water and mix. 120 tablet 1   sulfamethoxazole-trimethoprim (BACTRIM DS) 800-160 MG tablet Take 1 tablet by mouth 2 (two) times daily.     amLODipine (NORVASC) 10 MG tablet TAKE 1 TABLET BY MOUTH AT BEDTIME (Patient not taking: Reported on 10/23/2023) 90 tablet 3   atorvastatin (LIPITOR) 20 MG tablet TAKE 1 TABLET(20 MG) BY MOUTH DAILY (Patient not taking: Reported on 10/23/2023) 90 tablet 0   nebivolol (BYSTOLIC) 10 MG tablet TAKE 1 TABLET BY MOUTH IN THE MORNING AND AT BEDTIME (Patient not taking: Reported on 10/23/2023) 180 tablet 0   No current facility-administered medications on file prior to visit.    Allergies  Allergen Reactions   Motrin [Ibuprofen] Other (See Comments)    ESRD     Social History   Substance and Sexual Activity  Alcohol Use No    Social History   Tobacco Use  Smoking Status Never   Passive exposure: Never  Smokeless Tobacco Never    ROS  Objective   Vitals:   10/23/23 1258  BP: 100/61  Pulse: 94  Resp: 12  Temp: 98.3 F (36.8 C)  SpO2: 96%    Physical Exam Vitals reviewed.  Constitutional:      Appearance: Normal appearance. She is not  ill-appearing.  Cardiovascular:     Rate and Rhythm: Normal rate and regular rhythm.     Heart sounds: Normal heart sounds. No murmur heard.  No friction rub. No gallop.  Pulmonary:     Effort: Pulmonary effort is normal. No respiratory distress.     Breath sounds: Normal breath sounds. No stridor. No wheezing, rhonchi or rales.  Genitourinary:    Comments: A punctate opening was noted between the vulva and the anus at approximately the 10 o'clock position.  There was induration deep but I could not feel a fistulous track to either the vagina or the anus.  No purulent drainage was noted.  Minimal serous drainage was present.  Skin:    General: Skin is warm and dry.  Neurological:     Mental Status: She is alert and oriented to person, place, and time.   Dr. 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 Plan  I offered surgical exploration of the fistulous track but the patient states she is feeling better and would like to wait a month.  She is finishing her Bactrim.  I told her if she gets worse over the next few weeks, she should return to my office sooner.  I will follow-up with her in 1 month to reexamine the perineum.  She should keep the area clean and dry with soap and water.

## 2023-10-23 NOTE — Telephone Encounter (Signed)
 Please let patient know that I reviewed with Dr. Jena Gauss: recommend referral for EGD/EUS due to abnormal gastric mucosa on EGD and PET with metabolic activity of antrum. Hx of metastatic breast cancer.   Mindy/Tanya: please refer for EGD/EUS to [facility]. If schedule is too far out, please let me know, so we can regroup.

## 2023-10-24 DIAGNOSIS — N2581 Secondary hyperparathyroidism of renal origin: Secondary | ICD-10-CM | POA: Diagnosis not present

## 2023-10-24 DIAGNOSIS — Z992 Dependence on renal dialysis: Secondary | ICD-10-CM | POA: Diagnosis not present

## 2023-10-24 DIAGNOSIS — D631 Anemia in chronic kidney disease: Secondary | ICD-10-CM | POA: Diagnosis not present

## 2023-10-24 DIAGNOSIS — N186 End stage renal disease: Secondary | ICD-10-CM | POA: Diagnosis not present

## 2023-10-24 DIAGNOSIS — E8770 Fluid overload, unspecified: Secondary | ICD-10-CM | POA: Diagnosis not present

## 2023-10-25 NOTE — Telephone Encounter (Signed)
 FYI:  Mindy / Tammy: pt does agree to referral for EGD/EUS @ [place]   Anna: pt was advised of this note and she wants referral done

## 2023-10-26 DIAGNOSIS — N186 End stage renal disease: Secondary | ICD-10-CM | POA: Diagnosis not present

## 2023-10-26 DIAGNOSIS — Z992 Dependence on renal dialysis: Secondary | ICD-10-CM | POA: Diagnosis not present

## 2023-10-26 DIAGNOSIS — E8770 Fluid overload, unspecified: Secondary | ICD-10-CM | POA: Diagnosis not present

## 2023-10-26 DIAGNOSIS — D631 Anemia in chronic kidney disease: Secondary | ICD-10-CM | POA: Diagnosis not present

## 2023-10-26 DIAGNOSIS — N2581 Secondary hyperparathyroidism of renal origin: Secondary | ICD-10-CM | POA: Diagnosis not present

## 2023-10-26 NOTE — Addendum Note (Signed)
 Addended by: Armstead Peaks on: 10/26/2023 10:17 AM   Modules accepted: Orders

## 2023-10-26 NOTE — Telephone Encounter (Signed)
 Referral placed.

## 2023-10-29 ENCOUNTER — Emergency Department (HOSPITAL_COMMUNITY)

## 2023-10-29 ENCOUNTER — Emergency Department (HOSPITAL_COMMUNITY)
Admission: EM | Admit: 2023-10-29 | Discharge: 2023-10-29 | Disposition: A | Discharge status: Home / Self Care | Attending: Emergency Medicine | Admitting: Emergency Medicine

## 2023-10-29 ENCOUNTER — Telehealth: Payer: Self-pay | Admitting: Gastroenterology

## 2023-10-29 ENCOUNTER — Other Ambulatory Visit: Payer: Self-pay

## 2023-10-29 DIAGNOSIS — N186 End stage renal disease: Secondary | ICD-10-CM | POA: Diagnosis not present

## 2023-10-29 DIAGNOSIS — R519 Headache, unspecified: Secondary | ICD-10-CM | POA: Diagnosis not present

## 2023-10-29 DIAGNOSIS — R072 Precordial pain: Secondary | ICD-10-CM | POA: Diagnosis not present

## 2023-10-29 DIAGNOSIS — E8770 Fluid overload, unspecified: Secondary | ICD-10-CM | POA: Diagnosis not present

## 2023-10-29 DIAGNOSIS — Z7982 Long term (current) use of aspirin: Secondary | ICD-10-CM | POA: Insufficient documentation

## 2023-10-29 DIAGNOSIS — Z992 Dependence on renal dialysis: Secondary | ICD-10-CM | POA: Diagnosis not present

## 2023-10-29 DIAGNOSIS — E1122 Type 2 diabetes mellitus with diabetic chronic kidney disease: Secondary | ICD-10-CM | POA: Insufficient documentation

## 2023-10-29 DIAGNOSIS — D631 Anemia in chronic kidney disease: Secondary | ICD-10-CM | POA: Diagnosis not present

## 2023-10-29 DIAGNOSIS — R0789 Other chest pain: Secondary | ICD-10-CM | POA: Diagnosis not present

## 2023-10-29 DIAGNOSIS — N2581 Secondary hyperparathyroidism of renal origin: Secondary | ICD-10-CM | POA: Diagnosis not present

## 2023-10-29 DIAGNOSIS — C50919 Malignant neoplasm of unspecified site of unspecified female breast: Secondary | ICD-10-CM | POA: Insufficient documentation

## 2023-10-29 DIAGNOSIS — R079 Chest pain, unspecified: Secondary | ICD-10-CM | POA: Diagnosis not present

## 2023-10-29 LAB — CBC
HCT: 26.8 % — ABNORMAL LOW (ref 36.0–46.0)
Hemoglobin: 8.4 g/dL — ABNORMAL LOW (ref 12.0–15.0)
MCH: 35.9 pg — ABNORMAL HIGH (ref 26.0–34.0)
MCHC: 31.3 g/dL (ref 30.0–36.0)
MCV: 114.5 fL — ABNORMAL HIGH (ref 80.0–100.0)
Platelets: 153 10*3/uL (ref 150–400)
RBC: 2.34 MIL/uL — ABNORMAL LOW (ref 3.87–5.11)
WBC: 6 10*3/uL (ref 4.0–10.5)
nRBC: 0.3 % — ABNORMAL HIGH (ref 0.0–0.2)

## 2023-10-29 LAB — BASIC METABOLIC PANEL
Anion gap: 17 — ABNORMAL HIGH (ref 5–15)
BUN: 19 mg/dL (ref 8–23)
CO2: 27 mmol/L (ref 22–32)
Calcium: 9.6 mg/dL (ref 8.9–10.3)
Chloride: 93 mmol/L — ABNORMAL LOW (ref 98–111)
Creatinine, Ser: 6.07 mg/dL — ABNORMAL HIGH (ref 0.44–1.00)
GFR, Estimated: 7 mL/min — ABNORMAL LOW (ref >60)
Glucose, Bld: 127 mg/dL — ABNORMAL HIGH (ref 70–99)
Potassium: 2.7 mmol/L — CL (ref 3.5–5.1)
Sodium: 137 mmol/L (ref 135–145)

## 2023-10-29 LAB — TROPONIN I (HIGH SENSITIVITY)
Troponin I (High Sensitivity): 23 ng/L — ABNORMAL HIGH (ref <18)
Troponin I (High Sensitivity): 24 ng/L — ABNORMAL HIGH (ref <18)

## 2023-10-29 MED ORDER — ASPIRIN 81 MG PO CHEW
324.0000 mg | CHEWABLE_TABLET | Freq: Once | ORAL | Status: AC
Start: 2023-10-29 — End: 2023-10-29
  Administered 2023-10-29: 324 mg via ORAL
  Filled 2023-10-29: qty 4

## 2023-10-29 MED ORDER — POTASSIUM CHLORIDE 20 MEQ PO PACK
60.0000 meq | PACK | ORAL | Status: AC
  Administered 2023-10-29: 60 meq via ORAL
  Filled 2023-10-29: qty 3

## 2023-10-29 MED ORDER — ACETAMINOPHEN 500 MG PO TABS: 1000.0000 mg | ORAL_TABLET | Freq: Once | ORAL | Status: DC

## 2023-10-29 MED ORDER — NITROGLYCERIN 0.4 MG SL SUBL
0.4000 mg | SUBLINGUAL_TABLET | SUBLINGUAL | Status: DC | PRN
  Administered 2023-10-29: 0.4 mg via SUBLINGUAL
  Filled 2023-10-29: qty 1

## 2023-10-29 NOTE — Discharge Instructions (Signed)
 You were seen for your chest pain in the emergency department.   At home, please take tylenol for your pain.    Follow-up with your primary doctor in 2-3 days regarding your visit.   Return immediately to the emergency department if you experience any of the following: Worsening pain, difficulty breathing, unexplained vomiting or sweating, or any other concerning symptoms.    Thank you for visiting our Emergency Department. It was a pleasure taking care of you today.

## 2023-10-29 NOTE — ED Triage Notes (Signed)
 Pt arrived via POV from dialysis this morning. Pt reports receiving a few minutes of dialysis and she began developing sternal chest pain. Pt advised to come to ER for evaluation.

## 2023-10-29 NOTE — Telephone Encounter (Signed)
 Reviewed the last endoscopy report as well as a PET/CT. Not sure that we are going to find that this is anything more than inflammatory, but I think it is not unreasonable to consider EUS. Schedule upper EUS next available in the next 6 to 8 weeks (looks like we may have had a cancellation for next week if it works for her).

## 2023-10-29 NOTE — Telephone Encounter (Signed)
 Dr. Meridee Score,  Urgent referral in [place] for EUS/Endo.  Re: abnormal gastric mucosa on EGD and PET with metabolic activity of antrum. Hx of metastatic breast cancer.  Please review and advise scheduling.

## 2023-10-29 NOTE — ED Notes (Signed)
 Dr. Eloise Harman aware of K+ result.

## 2023-10-29 NOTE — ED Provider Notes (Signed)
 [place] [facility] AT [facility] Provider Note   CSN: [id number] Arrival date & time: 10/29/23  5621     History {Add pertinent medical, surgical, social history, OB history to HPI:1} Chief Complaint  Patient presents with   Chest Pain    Megan [name] is a 63 y.o. female.  63 yo F with hx of ESRD on MWF iHD, breast cancer sp chemo, HLD, DM, chest pain, and headaches who presents emergency department chest pain.  Patient was in the middle of a dialysis session when she started experiencing substernal chest pain.  Has difficulty characterizing.  With severe when it for started but is now 5/10 in severity.  Did not radiate.  No diaphoresis or vomiting.  Not exertional or pleuritic.  No chest pain, shortness of breath, cough, or leg swelling recently.  Has had chest pains in the past but says that this was more severe than prior.  Also had a headache during dialysis that she says is similar to prior.  Says she has completed her cancer treatment. Has nonobstructive cad but no hx of stents.        Home Medications Prior to Admission medications   Medication Sig Start Date End Date Taking? Authorizing Provider  amLODipine (NORVASC) 10 MG tablet TAKE 1 TABLET BY MOUTH AT BEDTIME Patient not taking: Reported on 10/23/2023 08/07/23   Donita Brooks, MD  aspirin EC 81 MG tablet Take 81 mg by mouth daily.    [provider]  atorvastatin (LIPITOR) 20 MG tablet TAKE 1 TABLET(20 MG) BY MOUTH DAILY Patient not taking: Reported on 10/23/2023 08/13/23   Donita Brooks, MD  B Complex-C-Zn-Folic Acid (DIALYVITE 800-ZINC 15) 0.8 MG TABS Take 1 tablet by mouth daily. 06/19/22   [provider]  cinacalcet (SENSIPAR) 60 MG tablet Take 60 mg by mouth daily.    [provider]  Darbepoetin Alfa (ARANESP, ALBUMIN FREE, IJ) Darbepoetin Alfa (Aranesp) 07/06/23 07/04/24  [provider]  famotidine (PEPCID) 40 MG tablet Take 1 tablet (40 mg  total) by mouth daily. 10/05/23   Donita Brooks, MD  fluticasone (FLONASE) 50 MCG/ACT nasal spray Place 2 sprays into both nostrils daily. Patient taking differently: Place 2 sprays into both nostrils daily as needed for allergies or rhinitis. 10/20/22   Donita Brooks, MD  gabapentin (NEURONTIN) 300 MG capsule TAKE 1 CAPSULE(300 MG) BY MOUTH AT BEDTIME 06/12/23   Donita Brooks, MD  iron sucrose (VENOFER) 20 MG/ML injection Inject 50 mg into the vein once a week. 02/19/23 02/18/24  [provider]  lanthanum (FOSRENOL) 1000 MG chewable tablet Chew 2,000-3,000 mg by mouth See admin instructions. Take 3 tablets (3000 mg) by mouth with meals and take 2 tablets (2000 mg) with snacks 10/16/19   [provider]  lidocaine-prilocaine (EMLA) cream APPLY TOPICALLY TO THE AFFECTED AREA 1 TIME 10/01/23   [name], MD  meclizine (ANTIVERT) 25 MG tablet Take 1 tablet (25 mg total) by mouth 3 (three) times daily as needed for dizziness. 06/12/23   Donita Brooks, MD  nebivolol (BYSTOLIC) 10 MG tablet TAKE 1 TABLET BY MOUTH IN THE MORNING AND AT BEDTIME Patient not taking: Reported on 10/23/2023 08/17/23   Donita Brooks, MD  nitroGLYCERIN (NITROSTAT) 0.4 MG SL tablet Place 1 tablet (0.4 mg total) under the tongue every 5 (five) minutes x 3 doses as needed for chest pain (if no relief after 3rd dose, proceed to ED or call 911). 09/13/22  Antoine Poche, MD  oxyCODONE (ROXICODONE) 5 MG immediate release tablet Take 1 tablet (5 mg total) by mouth every 6 (six) hours as needed. 06/07/23 06/06/24  Franky Macho, MD  pantoprazole (PROTONIX) 40 MG tablet Take 1 tablet (40 mg total) by mouth 2 (two) times daily before a meal. 10/08/23   Rourk, Gerrit Friends, MD  sucralfate (CARAFATE) 1 g tablet Take 1 tablet (1 g total) by mouth 4 (four) times daily -  before meals and at bedtime. Crush in small amount of water and mix. 09/20/23   Gelene Mink, NP  sulfamethoxazole-trimethoprim (BACTRIM DS)  800-160 MG tablet Take 1 tablet by mouth 2 (two) times daily.    [provider]      Allergies    Motrin [ibuprofen]    Review of Systems   Review of Systems  Physical Exam Updated Vital Signs BP 133/75   Pulse 95   Temp 98.9 F (37.2 C) (Oral)   Resp (!) 21   Ht 5' 1.5" (1.562 m)   Wt 78.5 kg   SpO2 98%   BMI 32.16 kg/m  Physical Exam Vitals and nursing note reviewed.  Constitutional:      General: She is not in acute distress.    Appearance: She is well-developed.  HENT:     Head: Normocephalic and atraumatic.     Right Ear: External ear normal.     Left Ear: External ear normal.     Nose: Nose normal.  Eyes:     Extraocular Movements: Extraocular movements intact.     Conjunctiva/sclera: Conjunctivae normal.     Pupils: Pupils are equal, round, and reactive to light.  Cardiovascular:     Rate and Rhythm: Normal rate and regular rhythm.     Heart sounds: No murmur heard.    Comments: Chest pain not reproducible. No rashes Pulmonary:     Effort: Pulmonary effort is normal. No respiratory distress.     Breath sounds: Normal breath sounds.  Musculoskeletal:     Cervical back: Normal range of motion and neck supple.     Right lower leg: No edema.     Left lower leg: No edema.  Skin:    General: Skin is warm and dry.  Neurological:     Mental Status: She is alert and oriented to person, place, and time. Mental status is at baseline.  Psychiatric:        Mood and Affect: Mood normal.     ED Results / Procedures / Treatments   Labs (all labs ordered are listed, but only abnormal results are displayed) Labs Reviewed  BASIC METABOLIC PANEL  CBC  TROPONIN I (HIGH SENSITIVITY)    EKG EKG Interpretation Date/Time:  Monday October 29 2023 09:20:11 EDT Ventricular Rate:  96 PR Interval:  149 QRS Duration:  91 QT Interval:  387 QTC Calculation: 490 R Axis:   15  Text Interpretation: Sinus rhythm Probable left atrial enlargement Borderline T  abnormalities, lateral leads Borderline prolonged QT interval Confirmed by Vonita Moss [phone]) on 10/29/2023 9:37:27 AM  Radiology No results found.  Procedures Procedures  {Document cardiac monitor, telemetry assessment procedure when appropriate:1}  Medications Ordered in ED Medications - No data to display  ED Course/ Medical Decision Making/ A&P   {   Click here for ABCD2, HEART and other calculatorsREFRESH Note before signing :1}  Medical Decision Making Amount and/or Complexity of Data Reviewed Labs: ordered. Radiology: ordered.  Risk OTC drugs. Prescription drug management.   ***  {Document critical care time when appropriate:1} {Document review of labs and clinical decision tools ie heart score, Chads2Vasc2 etc:1}  {Document your independent review of radiology images, and any outside records:1} {Document your discussion with family members, caretakers, and with consultants:1} {Document social determinants of health affecting pt's care:1} {Document your decision making why or why not admission, treatments were needed:1} Final Clinical Impression(s) / ED Diagnoses Final diagnoses:  None    Rx / DC Orders ED Discharge Orders     None

## 2023-10-30 ENCOUNTER — Telehealth: Payer: Self-pay

## 2023-10-30 ENCOUNTER — Ambulatory Visit: Payer: Medicare Other | Admitting: Vascular Surgery

## 2023-10-30 NOTE — Transitions of Care (Post Inpatient/ED Visit) (Signed)
 10/30/2023  Name: Megan [name] MRN: [id number] DOB: [birth year]  Today's TOC FU Call Status: Today's TOC FU Call Status:: Successful TOC FU Call Completed TOC FU Call Complete Date: 10/30/23 Patient's Name and Date of Birth confirmed.  Transition Care Management Follow-up Telephone Call Date of Discharge: 10/29/23 Type of Discharge: Emergency Department Reason for ED Visit: Other: (other chest pain) How have you been since you were released from the hospital?: Better Any questions or concerns?: No  Items Reviewed: Did you receive and understand the discharge instructions provided?: Yes Medications obtained,verified, and reconciled?: Yes (Medications Reviewed) Any new allergies since your discharge?: No Dietary orders reviewed?: Yes Do you have support at home?: Yes People in Home: spouse  Medications Reviewed Today: Medications Reviewed Today     Reviewed by Karena Addison, LPN (Licensed Practical Nurse) on 10/30/23 at 1129  Med List Status: <None>   Medication Order Taking? Sig Documenting Provider Last Dose Status Informant  amLODipine (NORVASC) 10 MG tablet [id number] No TAKE 1 TABLET BY MOUTH AT BEDTIME  Patient not taking: Reported on 10/23/2023   Donita Brooks, MD Not Taking Active Self, Pharmacy Records  aspirin EC 81 MG tablet [id number] No Take 81 mg by mouth daily. [provider] Taking Active Self, Pharmacy Records  atorvastatin (LIPITOR) 20 MG tablet [id number] No TAKE 1 TABLET(20 MG) BY MOUTH DAILY  Patient not taking: Reported on 10/23/2023   Donita Brooks, MD Not Taking Active Self, Pharmacy Records  B Complex-C-Zn-Folic Acid (DIALYVITE 800-ZINC 15) 0.8 MG TABS [id number] No Take 1 tablet by mouth daily. [provider] Taking Active Self, Pharmacy Records  cinacalcet (SENSIPAR) 60 MG tablet [id number] No Take 60 mg by mouth daily. [provider] Taking Active Self, Pharmacy Records  Darbepoetin Alfa (ARANESP, ALBUMIN FREE,  IJ) [id number] No Darbepoetin Alfa (Aranesp) [provider] Taking Active Self, Pharmacy Records           Med Note (COFFELL, Marzella Schlein   Sun Oct 14, 2023 12:16 PM) Unknown last dose, receives at dialysis.  famotidine (PEPCID) 40 MG tablet [id number] No Take 1 tablet (40 mg total) by mouth daily. Donita Brooks, MD Taking Active Self, Pharmacy Records  fluticasone [facility]) 50 MCG/ACT nasal spray [id number] No Place 2 sprays into both nostrils daily.  Patient taking differently: Place 2 sprays into both nostrils daily as needed for allergies or rhinitis.   Donita Brooks, MD Taking Active Self, Pharmacy Records  gabapentin (NEURONTIN) 300 MG capsule [id number] No TAKE 1 CAPSULE(300 MG) BY MOUTH AT BEDTIME Donita Brooks, MD Taking Active Self, Pharmacy Records  iron sucrose (VENOFER) 20 MG/ML injection [id number] No Inject 50 mg into the vein once a week. [provider] Taking Active Self, Pharmacy Records           Med Note (COFFELL, Wiliam Ke Oct 14, 2023 12:17 PM) Unknown last dose, receives at dialysis.  lanthanum (FOSRENOL) 1000 MG chewable tablet [id number] No Chew 2,000-3,000 mg by mouth See admin instructions. Take 3 tablets (3000 mg) by mouth with meals and take 2 tablets (2000 mg) with snacks [provider] Taking Active Self, Pharmacy Records           Med Note Sueanne Margarita Oct 05, 2020 11:02 AM)    lidocaine-prilocaine (EMLA) cream [id number] No APPLY TOPICALLY TO THE AFFECTED AREA 1 TIME [name], MD Taking Active Self, Pharmacy Records  meclizine (ANTIVERT) 25 MG tablet [id number] No Take 1  tablet (25 mg total) by mouth 3 (three) times daily as needed for dizziness. Donita Brooks, MD Taking Active Self, Pharmacy Records  nebivolol (BYSTOLIC) 10 MG tablet [id number] No TAKE 1 TABLET BY MOUTH IN THE MORNING AND AT BEDTIME  Patient not taking: Reported on 10/23/2023   Donita Brooks, MD Not Taking Active Self, Pharmacy Records   nitroGLYCERIN (NITROSTAT) 0.4 MG SL tablet [id number] No Place 1 tablet (0.4 mg total) under the tongue every 5 (five) minutes x 3 doses as needed for chest pain (if no relief after 3rd dose, proceed to ED or call 911). Antoine Poche, MD Taking Active Self, Pharmacy Records           Med Note (COFFELL, Wiliam Ke Oct 14, 2023 12:18 PM) Has available, never used.  oxyCODONE (ROXICODONE) 5 MG immediate release tablet [id number] No Take 1 tablet (5 mg total) by mouth every 6 (six) hours as needed. Franky Macho, MD Taking Active Self, Pharmacy Records  pantoprazole (PROTONIX) 40 MG tablet [id number] No Take 1 tablet (40 mg total) by mouth 2 (two) times daily before a meal. Rourk, Gerrit Friends, MD Taking Active Self, Pharmacy Records  sucralfate (CARAFATE) 1 g tablet [id number] No Take 1 tablet (1 g total) by mouth 4 (four) times daily -  before meals and at bedtime. Crush in small amount of water and mix. Gelene Mink, NP Taking Active Self, Pharmacy Records  sulfamethoxazole-trimethoprim (BACTRIM DS) 800-160 MG tablet [id number] No Take 1 tablet by mouth 2 (two) times daily. [provider] Taking Active Self, Pharmacy Records           Med Note (COFFELL, Marzella Schlein   Sun Oct 14, 2023 12:19 PM) 10 day course, pt has taken for about 5 days.            Home Care and Equipment/Supplies: Were Home Health Services Ordered?: NA Any new equipment or medical supplies ordered?: NA  Functional Questionnaire: Do you need assistance with bathing/showering or dressing?: No Do you need assistance with meal preparation?: No Do you need assistance with eating?: No Do you have difficulty maintaining continence: No Do you need assistance with getting out of bed/getting out of a chair/moving?: No Do you have difficulty managing or taking your medications?: No  Follow up appointments reviewed: PCP Follow-up appointment confirmed?: Yes Date of PCP follow-up appointment?: 11/02/23 Follow-up Provider:  [facility] Follow-up appointment confirmed?: NA Do you need transportation to your follow-up appointment?: No Do you understand care options if your condition(s) worsen?: Yes-patient verbalized understanding    SIGNATURE Karena Addison, LPN [facility] Nurse Health Advisor Direct Dial [phone]

## 2023-10-31 ENCOUNTER — Other Ambulatory Visit: Payer: Self-pay

## 2023-10-31 DIAGNOSIS — E8770 Fluid overload, unspecified: Secondary | ICD-10-CM | POA: Diagnosis not present

## 2023-10-31 DIAGNOSIS — D631 Anemia in chronic kidney disease: Secondary | ICD-10-CM | POA: Diagnosis not present

## 2023-10-31 DIAGNOSIS — K319 Disease of stomach and duodenum, unspecified: Secondary | ICD-10-CM

## 2023-10-31 DIAGNOSIS — N2581 Secondary hyperparathyroidism of renal origin: Secondary | ICD-10-CM | POA: Diagnosis not present

## 2023-10-31 DIAGNOSIS — N186 End stage renal disease: Secondary | ICD-10-CM | POA: Diagnosis not present

## 2023-10-31 DIAGNOSIS — R9389 Abnormal findings on diagnostic imaging of other specified body structures: Secondary | ICD-10-CM

## 2023-10-31 DIAGNOSIS — Z992 Dependence on renal dialysis: Secondary | ICD-10-CM | POA: Diagnosis not present

## 2023-10-31 NOTE — Telephone Encounter (Signed)
 EUS has been set up for 12/31/23 at 11 am at [facility] with GM

## 2023-11-01 ENCOUNTER — Other Ambulatory Visit: Payer: Self-pay

## 2023-11-01 NOTE — Telephone Encounter (Signed)
 EUS scheduled, pt instructed and medications reviewed.  Patient instructions mailed to home.  Patient to call with any questions or concerns.  The pt states she has dialysis the same day as EUS and wanted to know if she can keep that as planned. I did advise that it may be best to reschedule the dialysis appt. She agrees and will move to the next day.

## 2023-11-02 ENCOUNTER — Ambulatory Visit (INDEPENDENT_AMBULATORY_CARE_PROVIDER_SITE_OTHER): Admitting: Family Medicine

## 2023-11-02 ENCOUNTER — Encounter: Payer: Self-pay | Admitting: Family Medicine

## 2023-11-02 VITALS — BP 120/64 | HR 89 | Ht 61.5 in | Wt 172.0 lb

## 2023-11-02 DIAGNOSIS — N186 End stage renal disease: Secondary | ICD-10-CM | POA: Diagnosis not present

## 2023-11-02 DIAGNOSIS — R0789 Other chest pain: Secondary | ICD-10-CM

## 2023-11-02 DIAGNOSIS — E8770 Fluid overload, unspecified: Secondary | ICD-10-CM | POA: Diagnosis not present

## 2023-11-02 DIAGNOSIS — N2581 Secondary hyperparathyroidism of renal origin: Secondary | ICD-10-CM | POA: Diagnosis not present

## 2023-11-02 DIAGNOSIS — D631 Anemia in chronic kidney disease: Secondary | ICD-10-CM | POA: Diagnosis not present

## 2023-11-02 DIAGNOSIS — Z992 Dependence on renal dialysis: Secondary | ICD-10-CM | POA: Diagnosis not present

## 2023-11-02 NOTE — Progress Notes (Signed)
 Subjective:    Patient ID: Megan [name], female    DOB: 24-May-1961, 63 y.o.   MRN: [id number]  Patient recently had to go to the emergency room with chest pain.  She states that with dialysis, at the end of dialysis, she gets an intense pain in the substernal area.  In the emergency room, high-sensitivity troponins were stable in the low 20s.  EKG showed normal sinus rhythm with nonspecific ST changes in the inferolateral leads.  This is chronic.  She did have borderline QT prolongation.  Chest x-ray was unremarkable.  Patient had a catheterization in 2020.  This showed a 10% blockage in the LAD and a 25% stenosis in the right coronary artery.  Patient denies any angina or shortness of breath with activity.  This only occurs at the analysis per her report.  She states that she does fine on her days away from dialysis.  She denies any shortness of breath.  She denies any pleurisy.  She denies any hemoptysis. Past Medical History:  Diagnosis Date   (HFpEF) heart failure with preserved ejection fraction (HCC)    a. 01/2019 Echo: EF 55-60%, mild conc LVH. DD.  Torn MV chordae.   Anemia    Atypical chest pain    a. 08/2018 MV: EF 59%, no ischemia; b. 02/2019 Cath: nonobs dzs.   Blood transfusion without reported diagnosis    Breast cancer (HCC) 10/12/2020   Cataract    ESRD (end stage renal disease) on dialysis [facility])    a. HD T, T, S   Essential hypertension, benign    GERD (gastroesophageal reflux disease)    Headache    Hemorrhoids    Mixed hyperlipidemia    Morbid obesity (HCC)    Non-obstructive CAD (coronary artery disease)    a. 02/2019 CathL LM nl, LAD 108m, LCX nl, RCA 25p, EF 55-65%.   PONV (postoperative nausea and vomiting)    Renal insufficiency    S/P colonoscopy 08/2009   Dr. Elnoria Howard: sessile polyp (benign lymphoid), large hemorrhoids, repeat 5-10 years   Temporal arteritis (HCC)    Type 2 diabetes mellitus (HCC)    Wears glasses    Past Surgical History:  Procedure  Laterality Date   ABDOMINAL HYSTERECTOMY     APPENDECTOMY     ARTERY BIOPSY N/A 05/09/2018   Procedure: RIGHT TEMPORAL ARTERY BIOPSY;  Surgeon: Jimmye Norman, MD;  Location[facility] OR;  Service: General;  Laterality: N/A;   BIOPSY  10/04/2023   Procedure: BIOPSY;  Surgeon: Corbin Ade, MD;  Location: [facility];  Service: Endoscopy;;   BREAST BIOPSY Right 06/15/2020   Procedure: RIGHT BREAST BIOPSY;  Surgeon: Franky Macho, MD;  Location: [facility] ORS;  Service: General;  Laterality: Right;   CATARACT EXTRACTION W/PHACO Left 02/09/2017   Procedure: CATARACT EXTRACTION PHACO AND INTRAOCULAR LENS PLACEMENT LEFT EYE;  Surgeon: Gemma Payor, MD;  Location: [facility] ORS;  Service: Ophthalmology;  Laterality: Left;  CDE: 4.89   CATARACT EXTRACTION W/PHACO Right 06/04/2017   Procedure: CATARACT EXTRACTION PHACO AND INTRAOCULAR LENS PLACEMENT (IOC);  Surgeon: Gemma Payor, MD;  Location: [facility];  Service: Ophthalmology;  Laterality: Right;  CDE: 4.12   CHOLECYSTECTOMY  09/29/2011   Procedure: LAPAROSCOPIC CHOLECYSTECTOMY;  Surgeon: Dalia Heading, MD;  Location: [facility] OR[facility];  Service: General;  Laterality: N/A;   COLONOSCOPY  08/2009   Dr. Elnoria Howard: sessile polyp (benign lymphoid), large hemorrhoids, repeat 5-10 years   COLONOSCOPY N/A 06/12/2016   prominent hemorrhoids   COLONOSCOPY WITH  PROPOFOL N/A 06/16/2021   Procedure: COLONOSCOPY WITH PROPOFOL;  Surgeon: Corbin Ade, MD;  [facility];  Service: Endoscopy;  Laterality: N/A;  9:30am (dialysis pt)   ESOPHAGOGASTRODUODENOSCOPY  09/05/2011   JYN:WGNFA hiatal hernia; remainder of exam normal. No explanation for patient's abdominal pain with today's examination   ESOPHAGOGASTRODUODENOSCOPY N/A 12/17/2013   Dr. Jena Gauss: gastric erythema, erosion, mild chronic inflammation on path    ESOPHAGOGASTRODUODENOSCOPY (EGD) WITH PROPOFOL N/A 06/16/2021   Procedure: ESOPHAGOGASTRODUODENOSCOPY (EGD) WITH PROPOFOL;  Surgeon: Corbin Ade, MD;  [facility];   Service: Endoscopy;  Laterality: N/A;   ESOPHAGOGASTRODUODENOSCOPY (EGD) WITH PROPOFOL N/A 10/04/2023   Procedure: ESOPHAGOGASTRODUODENOSCOPY (EGD) WITH PROPOFOL;  Surgeon: Corbin Ade, MD;  [facility];  Service: Endoscopy;  Laterality: N/A;  200pm, ok rm 1-2, pt knows to arrive at 6:45   EXCISION OF BREAST BIOPSY Right 10/12/2020   Procedure: EXCISION OF RIGHT BREAST BIOPSY;  Surgeon: Franky Macho, MD;  Location: [facility];  Service: General;  Laterality: Right;   IR DIALY SHUNT INTRO NEEDLE/INTRACATH INITIAL W/IMG LEFT Left 07/17/2023   LAPAROSCOPIC APPENDECTOMY  09/29/2011   Procedure: APPENDECTOMY LAPAROSCOPIC;  Surgeon: Dalia Heading, MD;  Location: [facility];  Service: General;;  incidental appendectomy   LEFT HEART CATH AND CORONARY ANGIOGRAPHY N/A 02/28/2019   Procedure: LEFT HEART CATH AND CORONARY ANGIOGRAPHY;  Surgeon: Corky Crafts, MD;  Location: [facility] INVASIVE CV LAB;  Service: Cardiovascular;  Laterality: N/A;   MASS EXCISION Right 01/18/2023   Procedure: EXCISION MASS, RIGHT CHEST S/P MASTECTOMY;  Surgeon: Franky Macho, MD;  Location: [facility];  Service: General;  Laterality: Right;   MASTECTOMY MODIFIED RADICAL Right 02/18/2020   Procedure: MASTECTOMY MODIFIED RADICAL;  Surgeon: Franky Macho, MD;  Location: [facility];  Service: General;  Laterality: Right;   MASTECTOMY, PARTIAL Right 07/13/2020   Procedure: RIGHT PARTIAL MASTECTOMY;  Surgeon: Franky Macho, MD;  Location: [facility];  Service: General;  Laterality: Right;   PARTIAL MASTECTOMY WITH NEEDLE LOCALIZATION AND AXILLARY SENTINEL LYMPH NODE BX Right 09/18/2018   Procedure: RIGHT PARTIAL MASTECTOMY AFTER NEEDLE LOCALIZATION, SENTINEL LYMPH NODE BIOPSY RIGHT AXILLA;  Surgeon: Franky Macho, MD;  Location: [facility];  Service: General;  Laterality: Right;   POLYPECTOMY  06/16/2021   Procedure: POLYPECTOMY;  Surgeon: Corbin Ade, MD;  [facility];  Service: Endoscopy;;   PORTACATH PLACEMENT Right  06/07/2023   Procedure: INSERTION PORT-A-CATH, RIGHT  (DIALYSIS ACCESS ON LEFT);  Surgeon: Franky Macho, MD;  Location: [facility];  Service: General;  Laterality: Right;   SIMPLE MASTECTOMY WITH AXILLARY SENTINEL NODE BIOPSY Left 06/15/2020   Procedure: LEFT SIMPLE MASTECTOMY;  Surgeon: Franky Macho, MD;  Location: [facility];  Service: General;  Laterality: Left;   Current Outpatient Medications on File Prior to Visit  Medication Sig Dispense Refill   amLODipine (NORVASC) 10 MG tablet TAKE 1 TABLET BY MOUTH AT BEDTIME (Patient not taking: Reported on 10/23/2023) 90 tablet 3   aspirin EC 81 MG tablet Take 81 mg by mouth daily.     atorvastatin (LIPITOR) 20 MG tablet TAKE 1 TABLET(20 MG) BY MOUTH DAILY (Patient not taking: No sig reported) 90 tablet 0   B Complex-C-Zn-Folic Acid (DIALYVITE 800-ZINC 15) 0.8 MG TABS Take 1 tablet by mouth daily.     cinacalcet (SENSIPAR) 60 MG tablet Take 60 mg by mouth daily.     Darbepoetin Alfa (ARANESP, ALBUMIN FREE, IJ) Darbepoetin Alfa (Aranesp)     famotidine (PEPCID) 40  MG tablet Take 1 tablet (40 mg total) by mouth daily. 30 tablet 3   fluticasone (FLONASE) 50 MCG/ACT nasal spray Place 2 sprays into both nostrils daily. (Patient taking differently: Place 2 sprays into both nostrils daily as needed for allergies or rhinitis.) 16 g 6   gabapentin (NEURONTIN) 300 MG capsule TAKE 1 CAPSULE(300 MG) BY MOUTH AT BEDTIME 90 capsule 3   iron sucrose (VENOFER) 20 MG/ML injection Inject 50 mg into the vein once a week.     lanthanum (FOSRENOL) 1000 MG chewable tablet Chew 2,000-3,000 mg by mouth See admin instructions. Take 3 tablets (3000 mg) by mouth with meals and take 2 tablets (2000 mg) with snacks     lidocaine-prilocaine (EMLA) cream APPLY TOPICALLY TO THE AFFECTED AREA 1 TIME 30 g 3   meclizine (ANTIVERT) 25 MG tablet Take 1 tablet (25 mg total) by mouth 3 (three) times daily as needed for dizziness. 30 tablet 1   nebivolol (BYSTOLIC) 10 MG tablet TAKE 1 TABLET BY  MOUTH IN THE MORNING AND AT BEDTIME (Patient not taking: No sig reported) 180 tablet 0   nitroGLYCERIN (NITROSTAT) 0.4 MG SL tablet Place 1 tablet (0.4 mg total) under the tongue every 5 (five) minutes x 3 doses as needed for chest pain (if no relief after 3rd dose, proceed to ED or call 911). 25 tablet 3   oxyCODONE (ROXICODONE) 5 MG immediate release tablet Take 1 tablet (5 mg total) by mouth every 6 (six) hours as needed. 30 tablet 0   pantoprazole (PROTONIX) 40 MG tablet Take 1 tablet (40 mg total) by mouth 2 (two) times daily before a meal. 60 tablet 11   sucralfate (CARAFATE) 1 g tablet Take 1 tablet (1 g total) by mouth 4 (four) times daily -  before meals and at bedtime. Crush in small amount of water and mix. 120 tablet 1   No current facility-administered medications on file prior to visit.   Allergies  Allergen Reactions   Motrin [Ibuprofen] Other (See Comments)    ESRD    Social History   Socioeconomic History   Marital status: Married    Spouse name: Not on file   Number of children: Not on file   Years of education: Not on file   Highest education level: 12th grade  Occupational History   Occupation: Systems developer: [employer]  Tobacco Use   Smoking status: Never    Passive exposure: Never   Smokeless tobacco: Never  Vaping Use   Vaping status: Never Used  Substance and Sexual Activity   Alcohol use: No   Drug use: No   Sexual activity: Yes    Birth control/protection: Surgical  Other Topics Concern   Not on file  Social History Narrative   Works at [employer] in [place].    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   Also has to get items from high shelves-causes achy pain in shoulder area      Married.   Children are grown, out of house.   Social Drivers of Corporate investment banker Strain: Low Risk  (09/20/2023)   Overall Financial Resource Strain (CARDIA)    Difficulty of Paying Living Expenses: Not hard at all  Food  Insecurity: No Food Insecurity (09/20/2023)   Hunger Vital Sign    Worried About Running Out of Food in the Last Year: Never true    Ran Out of Food in the  Last Year: Never true  Transportation Needs: No Transportation Needs (09/20/2023)   PRAPARE - Administrator, Civil Service (Medical): No    Lack of Transportation (Non-Medical): No  Physical Activity: Inactive (09/20/2023)   Exercise Vital Sign    Days of Exercise per Week: 0 days    Minutes of Exercise per Session: 0 min  Stress: No Stress Concern Present (09/20/2023)   [employer] of Occupational Health - Occupational Stress Questionnaire    Feeling of Stress : Not at all  Social Connections: Moderately Integrated (09/20/2023)   Social Connection and Isolation Panel [NHANES]    Frequency of Communication with Friends and Family: More than three times a week    Frequency of Social Gatherings with Friends and Family: Three times a week    Attends Religious Services: More than 4 times per year    Active Member of Clubs or Organizations: No    Attends Banker Meetings: Never    Marital Status: Married  Catering manager Violence: Not At Risk (09/20/2023)   Humiliation, Afraid, Rape, and Kick questionnaire    Fear of Current or Ex-Partner: No    Emotionally Abused: No    Physically Abused: No    Sexually Abused: No    Review of Systems  All other systems reviewed and are negative.      Objective:   Physical Exam Vitals reviewed.  Constitutional:      General: She is not in acute distress.    Appearance: Normal appearance. She is normal weight.  Cardiovascular:     Rate and Rhythm: Normal rate and regular rhythm.     Pulses: Normal pulses.     Heart sounds: Murmur (thrill from AV fistual heard as murmur) heard.  Pulmonary:     Effort: Pulmonary effort is normal. No respiratory distress.     Breath sounds: Normal breath sounds. No stridor. No wheezing, rhonchi or rales.  Chest:     Chest wall: No  tenderness.  Musculoskeletal:     Right lower leg: No edema.     Left lower leg: No edema.     Right foot: Normal range of motion. No deformity.  Feet:     Right foot:     Skin integrity: No ulcer, blister, skin breakdown, erythema, warmth or callus.     Left foot:     Skin integrity: No ulcer, blister, skin breakdown, erythema, warmth or callus.  Neurological:     Mental Status: She is alert.           Assessment & Plan:  Atypical chest pain I do not believe that the patient's chest pain is cardiac in nature.  I question if she may be having spasms or cramps in the chest wall due to volume shifts associated with dialysis.  I recommended that she discuss this with her nephrologist to see if any changes could be made.  The only other option I would know to try would be to give her a muscle relaxer that she could take at dialysis to help prevent muscle spasms.  She plans to discuss this with her nephrologist.

## 2023-11-05 DIAGNOSIS — Z992 Dependence on renal dialysis: Secondary | ICD-10-CM | POA: Diagnosis not present

## 2023-11-05 DIAGNOSIS — E8770 Fluid overload, unspecified: Secondary | ICD-10-CM | POA: Diagnosis not present

## 2023-11-05 DIAGNOSIS — D631 Anemia in chronic kidney disease: Secondary | ICD-10-CM | POA: Diagnosis not present

## 2023-11-05 DIAGNOSIS — N2581 Secondary hyperparathyroidism of renal origin: Secondary | ICD-10-CM | POA: Diagnosis not present

## 2023-11-05 DIAGNOSIS — N186 End stage renal disease: Secondary | ICD-10-CM | POA: Diagnosis not present

## 2023-11-07 ENCOUNTER — Inpatient Hospital Stay: Payer: Medicare Other | Attending: Hematology

## 2023-11-07 ENCOUNTER — Ambulatory Visit (HOSPITAL_COMMUNITY)
Admission: RE | Admit: 2023-11-07 | Discharge: 2023-11-07 | Disposition: A | Discharge status: Home / Self Care | Payer: Medicare Other | Source: Ambulatory Visit | Attending: Hematology | Admitting: Hematology

## 2023-11-07 DIAGNOSIS — E8770 Fluid overload, unspecified: Secondary | ICD-10-CM | POA: Diagnosis not present

## 2023-11-07 DIAGNOSIS — Z452 Encounter for adjustment and management of vascular access device: Secondary | ICD-10-CM | POA: Diagnosis not present

## 2023-11-07 DIAGNOSIS — C799 Secondary malignant neoplasm of unspecified site: Secondary | ICD-10-CM

## 2023-11-07 DIAGNOSIS — C50111 Malignant neoplasm of central portion of right female breast: Secondary | ICD-10-CM | POA: Insufficient documentation

## 2023-11-07 DIAGNOSIS — D631 Anemia in chronic kidney disease: Secondary | ICD-10-CM | POA: Diagnosis not present

## 2023-11-07 DIAGNOSIS — C50911 Malignant neoplasm of unspecified site of right female breast: Secondary | ICD-10-CM | POA: Insufficient documentation

## 2023-11-07 DIAGNOSIS — Z170 Estrogen receptor positive status [ER+]: Secondary | ICD-10-CM | POA: Insufficient documentation

## 2023-11-07 DIAGNOSIS — Z992 Dependence on renal dialysis: Secondary | ICD-10-CM | POA: Diagnosis not present

## 2023-11-07 DIAGNOSIS — N186 End stage renal disease: Secondary | ICD-10-CM | POA: Diagnosis not present

## 2023-11-07 DIAGNOSIS — C761 Malignant neoplasm of thorax: Secondary | ICD-10-CM | POA: Diagnosis not present

## 2023-11-07 DIAGNOSIS — J90 Pleural effusion, not elsewhere classified: Secondary | ICD-10-CM | POA: Diagnosis not present

## 2023-11-07 DIAGNOSIS — N2581 Secondary hyperparathyroidism of renal origin: Secondary | ICD-10-CM | POA: Diagnosis not present

## 2023-11-07 LAB — CBC WITH DIFFERENTIAL/PLATELET
Abs Immature Granulocytes: 0.03 10*3/uL (ref 0.00–0.07)
Basophils Absolute: 0.1 10*3/uL (ref 0.0–0.1)
Basophils Relative: 1 %
Eosinophils Absolute: 0.1 10*3/uL (ref 0.0–0.5)
Eosinophils Relative: 1 %
HCT: 26.1 % — ABNORMAL LOW (ref 36.0–46.0)
Hemoglobin: 8.7 g/dL — ABNORMAL LOW (ref 12.0–15.0)
Immature Granulocytes: 0 %
Lymphocytes Relative: 12 %
Lymphs Abs: 1 10*3/uL (ref 0.7–4.0)
MCH: 38.5 pg — ABNORMAL HIGH (ref 26.0–34.0)
MCHC: 33.3 g/dL (ref 30.0–36.0)
MCV: 115.5 fL — ABNORMAL HIGH (ref 80.0–100.0)
Monocytes Absolute: 1 10*3/uL (ref 0.1–1.0)
Monocytes Relative: 13 %
Neutro Abs: 5.7 10*3/uL (ref 1.7–7.7)
Neutrophils Relative %: 73 %
Platelets: 196 10*3/uL (ref 150–400)
RBC: 2.26 MIL/uL — ABNORMAL LOW (ref 3.87–5.11)
Smear Review: ADEQUATE
WBC: 7.8 10*3/uL (ref 4.0–10.5)
nRBC: 0 % (ref 0.0–0.2)

## 2023-11-07 LAB — COMPREHENSIVE METABOLIC PANEL
ALT: 19 U/L (ref 0–44)
AST: 23 U/L (ref 15–41)
Albumin: 3.5 g/dL (ref 3.5–5.0)
Alkaline Phosphatase: 150 U/L — ABNORMAL HIGH (ref 38–126)
Anion gap: 15 (ref 5–15)
BUN: 19 mg/dL (ref 8–23)
CO2: 27 mmol/L (ref 22–32)
Calcium: 9.7 mg/dL (ref 8.9–10.3)
Chloride: 94 mmol/L — ABNORMAL LOW (ref 98–111)
Creatinine, Ser: 5.68 mg/dL — ABNORMAL HIGH (ref 0.44–1.00)
GFR, Estimated: 8 mL/min — ABNORMAL LOW (ref >60)
Glucose, Bld: 141 mg/dL — ABNORMAL HIGH (ref 70–99)
Potassium: 2.9 mmol/L — ABNORMAL LOW (ref 3.5–5.1)
Sodium: 136 mmol/L (ref 135–145)
Total Bilirubin: 0.5 mg/dL (ref 0.0–1.2)
Total Protein: 7.1 g/dL (ref 6.5–8.1)

## 2023-11-07 LAB — MAGNESIUM: Magnesium: 2 mg/dL (ref 1.7–2.4)

## 2023-11-07 MED ORDER — SODIUM CHLORIDE 0.9% FLUSH
10.0000 mL | INTRAVENOUS | Status: DC | PRN
  Administered 2023-11-07: 10 mL via INTRAVENOUS

## 2023-11-07 MED ORDER — HEPARIN SOD (PORK) LOCK FLUSH 100 UNIT/ML IV SOLN
INTRAVENOUS | Status: AC
  Filled 2023-11-07: qty 5

## 2023-11-07 MED ORDER — IOHEXOL 300 MG/ML  SOLN
100.0000 mL | Freq: Once | INTRAMUSCULAR | Status: AC | PRN
Start: 2023-11-07 — End: 2023-11-07
  Administered 2023-11-07: 100 mL via INTRAVENOUS

## 2023-11-07 MED ORDER — IOHEXOL 350 MG/ML SOLN: 100.0000 mL | Freq: Once | INTRAVENOUS | Status: DC | PRN

## 2023-11-07 NOTE — Progress Notes (Signed)
 Patient presents today for port flush with labs per provider's order. Port flushed easily with good blood return and no swelling or bruising noted at the site. Patient remains accessed for Ct scan.  Discharged from clinic ambulatory in stable condition. Alert and oriented x 3. F/U with [facility] as scheduled.

## 2023-11-09 DIAGNOSIS — N2581 Secondary hyperparathyroidism of renal origin: Secondary | ICD-10-CM | POA: Diagnosis not present

## 2023-11-09 DIAGNOSIS — Z992 Dependence on renal dialysis: Secondary | ICD-10-CM | POA: Diagnosis not present

## 2023-11-09 DIAGNOSIS — E8770 Fluid overload, unspecified: Secondary | ICD-10-CM | POA: Diagnosis not present

## 2023-11-09 DIAGNOSIS — N186 End stage renal disease: Secondary | ICD-10-CM | POA: Diagnosis not present

## 2023-11-09 DIAGNOSIS — D631 Anemia in chronic kidney disease: Secondary | ICD-10-CM | POA: Diagnosis not present

## 2023-11-12 ENCOUNTER — Other Ambulatory Visit: Payer: Self-pay | Admitting: Family Medicine

## 2023-11-12 DIAGNOSIS — Z992 Dependence on renal dialysis: Secondary | ICD-10-CM | POA: Diagnosis not present

## 2023-11-12 DIAGNOSIS — E8770 Fluid overload, unspecified: Secondary | ICD-10-CM | POA: Diagnosis not present

## 2023-11-12 DIAGNOSIS — D631 Anemia in chronic kidney disease: Secondary | ICD-10-CM | POA: Diagnosis not present

## 2023-11-12 DIAGNOSIS — N186 End stage renal disease: Secondary | ICD-10-CM | POA: Diagnosis not present

## 2023-11-12 DIAGNOSIS — N2581 Secondary hyperparathyroidism of renal origin: Secondary | ICD-10-CM | POA: Diagnosis not present

## 2023-11-12 NOTE — Progress Notes (Signed)
 [facility] [address] [place] [address]    Clinic Day:  11/13/23   Referring physician: Donita Brooks, MD  Patient Care Team: Donita Brooks, MD as PCP - General (Family Medicine) [name] [name], MD as PCP - Cardiology (Cardiology) [name], MD (Gastroenterology) [name], MD as Consulting Physician (Cardiology) [name], MD as Medical Oncologist (Medical Oncology) [name], MD as Consulting Physician (Nephrology)   ASSESSMENT & PLAN:   Assessment: 1.  Stage Ib (T1CN0) right breast cancer, ER positive, PR and HER-2 negative: -Right lumpectomy and sentinel lymph node biopsy on 09/18/2018 shows 1.5 cm IDC, grade 3, associated with high-grade DCIS, negative margins, 0/3 lymph nodes positive, ER 50%, PR negative and HER-2 negative, Ki-67 60%. -Oncotype DX recurrence score 47.  Distant recurrence at 9 years with tamoxifen alone is 36%.  Absolute chemotherapy benefit is more than 15%. -Adjuvant chemotherapy with 4 cycles of AC from 10/30/2018 through 01/01/2019. -She was started on anastrozole in June 2020. -Mammogram on 09/02/2019 showed calcifications in the posterior aspect of upper outer quadrant of the right breast. -Right partial mastectomy on 02/18/2020 shows high-grade DCIS, 1.5 cm, no invasive carcinoma, resection margins negative.  0/11 lymph nodes.  ER 30% positive, PR negative. -Left simple mastectomy on 06/15/2020 was benign.  Right breast biopsy shows microscopic focus of invasive ductal carcinoma in the background of extensive high-grade DCIS. -Right breast lumpectomy on 07/13/2020 shows multifocal invasive ductal carcinoma, grade 3, largest measuring 1.2 cm.  Extensive high-grade DCIS with necrosis.  Margins are free.  PT1CNX.  There are multiple foci of invasive carcinoma arising from extensive DCIS.  Several small foci of invasive tumor arising from DCIS indicating new primaries.  ER/PR/HER-2 is negative.   Ki-67 is 20%. -PET scan on 09/14/2020 shows areas of nodularity with discrete nodule in the fat of the inferior chest wall within the subcutaneous tissues. -Ultrasound of the right chest wall shows masslike abnormality in the upper inner aspect of the right anterior chest at 1 o'clock position measuring 4.1 cm in greatest dimension.  1.7 cm hypoechoic mass in the central anterior right chest concerning for malignancy.  Possible borderline enlarged residual right axillary lymph node. -Right breast biopsy on 10/12/2020 shows 1.3 cm invasive ductal carcinoma, focally 0.1 cm from superior margin.  DCIS is less than 0.1 cm from superior margin.  PT1CPNX. - Right mastectomy followed by radiation therapy completed in May 2022. - PET scan (11/30/2022): Recurrent/metastatic disease within the right medial pectoralis musculature. - Biopsy (12/19/2022): Metastatic breast adenocarcinoma.  ER positive, 40%, weak staining.  PR negative.  Ki-67 30%.  HER2 IHC 0. - Right chest wall mass resection on 01/18/2023.  Pathology shows 1.7 cm IDC, grade 3.  DCIS with high-grade with necrosis.  Carcinoma involves inked inferior margin with focally invading into atrophic skeletal muscle. - Her case was discussed at multidisciplinary clinic at [facility] on 04/18/2023. - Surgical oncology felt that resection margins are adequate and recommended radiation therapy.  She was also evaluated by medical oncology (Dr. Iona Hansen) who has recommended systemic therapy with 6 cycles of docetaxel, carboplatin and pembrolizumab. - XRT from 05/07/2023 through 05/25/2023. - 6 cycles of carboplatin, docetaxel and pembrolizumab from 06/14/2023 through 09/27/2023  2.  Osteopenia: -Bone density on 01/20/2019 shows T score -1.9. - DEXA scan (03/24/2021) T score -2.4  3.  ESRD on HD: - She is on HD on Monday/Wednesday/Friday under the direction of Dr. Arrie Aran.  Plan: 1.  Recurrent right breast cancer, ER 40%, weak staining, PR negative, HER2  negative: - She is recovering well from side effects of chemotherapy.  Last treatment was on 09/27/2023. - Labs from 11/07/2023: Alk phos minimally elevated at 150 and stable.  Rest of the LFTs are normal.  Hemoglobin 8.7. - CT CAP (11/07/2023): Unchanged subsolid nodule of the peripheral right lower lobe measuring 0.7 cm, stable from 2021.  Additional tiny nodule in the right lower lobe measuring 0.4 cm, obscured by pleural effusion on CT from 2021.  No adenopathy or metastatic disease.  Other benign findings discussed. - As her pathology from 01/18/2023 was triple negative, we are not starting her on antiestrogen therapy. - RTC 3 months for follow-up with repeat CT of the chest to follow-up on the lung nodule.  After that we will switch her scans to every 6 months.   2.  Hypertension: - Both Norvasc and Bystolic are on hold as she is hypotensive at dialysis.  Blood pressure today is 128/65.   3.  Neuropathy: -She reports tingling in the fingertips which is constant and new.  No neuropathic pains.  Neuropathy in the feet is stable.  Continue gabapentin 300 mg at bedtime.   4.  Right chest wall pain: - She does not report any chest wall pain at the site of resection. - However she does have pain at the port site.  She wants to have it removed.  She will be seeing Dr. Lovell Sheehan who will remove it.     Orders Placed This Encounter  Procedures   CT CHEST W CONTRAST    Standing Status:   Future    Expected Date:   02/12/2024    Expiration Date:   11/12/2024    If indicated for the ordered procedure, I authorize the administration of contrast media per Radiology protocol:   Yes    Does the patient have a contrast media/X-ray dye allergy?:   No    Preferred imaging location?:   [facility]   CBC with Differential    Standing Status:   Future    Expected Date:   02/11/2024    Expiration Date:   11/12/2024   Comprehensive metabolic panel    Standing Status:   Future    Expected Date:   02/11/2024     Expiration Date:   11/12/2024   Cancer antigen 15-3    Standing Status:   Future    Expected Date:   02/11/2024    Expiration Date:   11/12/2024   Cancer antigen 27.29    Standing Status:   Future    Expected Date:   02/11/2024    Expiration Date:   11/12/2024       Mikeal Hawthorne R Teague,acting as a scribe for [name], MD.,have documented all relevant documentation on the behalf of [name], MD,as directed by  [name], MD while in the presence of [name], MD.  I, [name] MD, have reviewed the above documentation for accuracy and completeness, and I agree with the above.      [name], MD   4/1/202510:15 AM  CHIEF COMPLAINT:   Diagnosis: recurrent right breast cancer    Cancer Staging  Malignant neoplasm of upper-inner quadrant of right female breast [facility]) Staging form: Breast, AJCC 8th Edition - Clinical stage from 09/26/2018: Stage IB (cT1c, cN0, cM0, G3, ER+, PR-, HER2-) - Signed by [name], MD on 09/26/2018    Prior Therapy: 1. Right lumpectomy  and sentinel lymph node biopsy on 09/18/2018  2. Adjuvant chemotherapy with 4 cycles of AC from 10/30/2018 through 01/01/2019  3. Right partial mastectomy on 02/18/2020  4. Right breast lumpectomy on 07/13/2020  5. Right mastectomy followed by radiation therapy completed in May 2022 6.  Anastrozole started in June 2020  Current Therapy: Carboplatin, docetaxel, pembrolizumab   HISTORY OF PRESENT ILLNESS:   Oncology History  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)  09/18/2018 Initial Diagnosis   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)   09/26/2018 Cancer Staging   Staging form: Breast, AJCC 8th Edition - Clinical stage from 09/26/2018: Stage IB (cT1c, cN0, cM0, G3, ER+, PR-, HER2-) - Signed by [name], MD on 09/26/2018   10/30/2018 - 01/01/2019 Chemotherapy   Patient is on Treatment  Plan : BREAST Adjuvant AC q21d     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 female presenting to clinic today for follow up of recurrent right breast cancer. She was last seen by me on 09/27/23.  Since her last visit, she had CT C/A/P on 11/07/23 that found: Status post right mastectomy and right axillary lymph node dissection. No significant change in appearance of infiltrative soft tissue thickening involving the right pectoral musculature, consistent with known local recurrence and previously with low-level residual FDG avidity. Unchanged irregular subsolid nodule of the peripheral right lower lobe measuring 0.7 cm stable since examination dated 2021 and therefore almost certainly benign and incidental. Additional tiny nodules of the dependent right lower lobe measuring up to 0.4 cm, difficult to clearly appreciate on prior free breathing PET-CT. No evidence of lymphadenopathy or metastatic disease in the abdomen or pelvis. Hepatic steatosis. Atrophic kidneys. Numerous bilateral nonobstructive renal calculi and bilateral renal cortical cysts. Coronary artery disease.  She underwent an EGD on 10/04/23. She was seen in the ED on 10/14/23 for IDA and was transfused 1 unit of blood.   Megan [name] had an endoscopic [place] scheduled for 12/31/23 with Dr. Meridee Score.   Today, she states that she is doing well overall. Her appetite level is at 100%. Her energy level is at 50%. She is accompanied by her husband.   PAST MEDICAL HISTORY:   Past Medical History: Past Medical History:  Diagnosis Date   (HFpEF) heart failure with preserved ejection fraction (HCC)    a. 01/2019 Echo: EF 55-60%, mild conc LVH. DD.  Torn MV chordae.   Anemia    Atypical chest  pain    a. 08/2018 MV: EF 59%, no ischemia; b. 02/2019 Cath: nonobs dzs.   Blood transfusion without reported diagnosis    Breast cancer (HCC) 10/12/2020   Cataract    ESRD (end stage renal disease) on dialysis [facility])    a. HD T, T, S   Essential hypertension, benign    GERD (gastroesophageal reflux disease)    Headache    Hemorrhoids    Mixed hyperlipidemia    Morbid obesity (HCC)    Non-obstructive CAD (coronary artery disease)    a. 02/2019 CathL LM nl, LAD 50m, LCX nl, RCA 25p, EF 55-65%.   PONV (postoperative nausea and vomiting)    Renal insufficiency  S/P colonoscopy 08/2009   Dr. Elnoria Howard: sessile polyp (benign lymphoid), large hemorrhoids, repeat 5-10 years   Temporal arteritis (HCC)    Type 2 diabetes mellitus (HCC)    Wears glasses     Surgical History: Past Surgical History:  Procedure Laterality Date   ABDOMINAL HYSTERECTOMY     APPENDECTOMY     ARTERY BIOPSY N/A 05/09/2018   Procedure: RIGHT TEMPORAL ARTERY BIOPSY;  Surgeon: Jimmye Norman, MD;  Location[facility] OR;  Service: General;  Laterality: N/A;   BIOPSY  10/04/2023   Procedure: BIOPSY;  Surgeon: Corbin Ade, MD;  Location: [facility];  Service: Endoscopy;;   BREAST BIOPSY Right 06/15/2020   Procedure: RIGHT BREAST BIOPSY;  Surgeon: Franky Macho, MD;  Location: [facility]S;  Service: General;  Laterality: Right;   CATARACT EXTRACTION W/PHACO Left 02/09/2017   Procedure: CATARACT EXTRACTION PHACO AND INTRAOCULAR LENS PLACEMENT LEFT EYE;  Surgeon: Gemma Payor, MD;  Location: [facility];  Service: Ophthalmology;  Laterality: Left;  CDE: 4.89   CATARACT EXTRACTION W/PHACO Right 06/04/2017   Procedure: CATARACT EXTRACTION PHACO AND INTRAOCULAR LENS PLACEMENT (IOC);  Surgeon: Gemma Payor, MD;  Location: [facility];  Service: Ophthalmology;  Laterality: Right;  CDE: 4.12   CHOLECYSTECTOMY  09/29/2011   Procedure: LAPAROSCOPIC CHOLECYSTECTOMY;  Surgeon: Dalia Heading, MD;  Location: [facility];  Service: General;  Laterality: N/A;    COLONOSCOPY  08/2009   Dr. Elnoria Howard: sessile polyp (benign lymphoid), large hemorrhoids, repeat 5-10 years   COLONOSCOPY N/A 06/12/2016   prominent hemorrhoids   COLONOSCOPY WITH PROPOFOL N/A 06/16/2021   Procedure: COLONOSCOPY WITH PROPOFOL;  Surgeon: Corbin Ade, MD;  [facility]: [facility];  Service: Endoscopy;  Laterality: N/A;  9:30am (dialysis pt)   ESOPHAGOGASTRODUODENOSCOPY  09/05/2011   [id number] hiatal hernia; remainder of exam normal. No explanation for patient's abdominal pain with today's examination   ESOPHAGOGASTRODUODENOSCOPY N/A 12/17/2013   Dr. Jena Gauss: gastric erythema, erosion, mild chronic inflammation on path    ESOPHAGOGASTRODUODENOSCOPY (EGD) WITH PROPOFOL N/A 06/16/2021   Procedure: ESOPHAGOGASTRODUODENOSCOPY (EGD) WITH PROPOFOL;  Surgeon: Corbin Ade, MD;  [facility];  Service: Endoscopy;  Laterality: N/A;   ESOPHAGOGASTRODUODENOSCOPY (EGD) WITH PROPOFOL N/A 10/04/2023   Procedure: ESOPHAGOGASTRODUODENOSCOPY (EGD) WITH PROPOFOL;  Surgeon: Corbin Ade, MD;  [facility];  Service: Endoscopy;  Laterality: N/A;  200pm, ok rm 1-2, pt knows to arrive at 6:45   EXCISION OF BREAST BIOPSY Right 10/12/2020   Procedure: EXCISION OF RIGHT BREAST BIOPSY;  Surgeon: Franky Macho, MD;  Location: [facility];  Service: General;  Laterality: Right;   IR DIALY SHUNT INTRO NEEDLE/INTRACATH INITIAL W/IMG LEFT Left 07/17/2023   LAPAROSCOPIC APPENDECTOMY  09/29/2011   Procedure: APPENDECTOMY LAPAROSCOPIC;  Surgeon: Dalia Heading, MD;  Location: [facility];  Service: General;;  incidental appendectomy   LEFT HEART CATH AND CORONARY ANGIOGRAPHY N/A 02/28/2019   Procedure: LEFT HEART CATH AND CORONARY ANGIOGRAPHY;  Surgeon: Corky Crafts, MD;  Location: [facility] INVASIVE CV LAB;  Service: Cardiovascular;  Laterality: N/A;   MASS EXCISION Right 01/18/2023   Procedure: EXCISION MASS, RIGHT CHEST S/P MASTECTOMY;  Surgeon: Franky Macho, MD;  Location: [facility];  Service: General;   Laterality: Right;   MASTECTOMY MODIFIED RADICAL Right 02/18/2020   Procedure: MASTECTOMY MODIFIED RADICAL;  Surgeon: Franky Macho, MD;  Location: [facility];  Service: General;  Laterality: Right;   MASTECTOMY, PARTIAL Right 07/13/2020   Procedure: RIGHT PARTIAL MASTECTOMY;  Surgeon: Franky Macho, MD;  Location: [facility];  Service: General;  Laterality: Right;   PARTIAL MASTECTOMY WITH NEEDLE LOCALIZATION AND AXILLARY SENTINEL LYMPH NODE BX Right 09/18/2018   Procedure: RIGHT PARTIAL MASTECTOMY AFTER NEEDLE LOCALIZATION, SENTINEL LYMPH NODE BIOPSY RIGHT AXILLA;  Surgeon: Franky Macho, MD;  Location: [facility];  Service: General;  Laterality: Right;   POLYPECTOMY  06/16/2021   Procedure: POLYPECTOMY;  Surgeon: Corbin Ade, MD;  [facility];  Service: Endoscopy;;   PORTACATH PLACEMENT Right 06/07/2023   Procedure: INSERTION PORT-A-CATH, RIGHT  (DIALYSIS ACCESS ON LEFT);  Surgeon: Franky Macho, MD;  Location: [facility];  Service: General;  Laterality: Right;   SIMPLE MASTECTOMY WITH AXILLARY SENTINEL NODE BIOPSY Left 06/15/2020   Procedure: LEFT SIMPLE MASTECTOMY;  Surgeon: Franky Macho, MD;  Location: [facility];  Service: General;  Laterality: Left;    Social History: Social History   Socioeconomic History   Marital status: Married    Spouse name: Not on file   Number of children: Not on file   Years of education: Not on file   Highest education level: 12th grade  Occupational History   Occupation: Systems developer: [employer]  Tobacco Use   Smoking status: Never    Passive exposure: Never   Smokeless tobacco: Never  Vaping Use   Vaping status: Never Used  Substance and Sexual Activity   Alcohol use: No   Drug use: No   Sexual activity: Yes    Birth control/protection: Surgical  Other Topics Concern   Not on file  Social History Narrative   Works at [employer] in [place].    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   Also has to get items from  high shelves-causes achy pain in shoulder area      Married.   Children are grown, out of house.   Social Drivers of Corporate investment banker Strain: Low Risk  (09/20/2023)   Overall Financial Resource Strain (CARDIA)    Difficulty of Paying Living Expenses: Not hard at all  Food Insecurity: No Food Insecurity (09/20/2023)   Hunger Vital Sign    Worried About Running Out of Food in the Last Year: Never true    Ran Out of Food in the Last Year: Never true  Transportation Needs: No Transportation Needs (09/20/2023)   PRAPARE - Administrator, Civil Service (Medical): No    Lack of Transportation (Non-Medical): No  Physical Activity: Inactive (09/20/2023)   Exercise Vital Sign    Days of Exercise per Week: 0 days    Minutes of Exercise per Session: 0 min  Stress: No Stress Concern Present (09/20/2023)   [employer] of Occupational Health - Occupational Stress Questionnaire    Feeling of Stress : Not at all  Social Connections: Moderately Integrated (09/20/2023)   Social Connection and Isolation Panel [NHANES]    Frequency of Communication with Friends and Family: More than three times a week    Frequency of Social Gatherings with Friends and Family: Three times a week    Attends Religious Services: More than 4 times per year    Active Member of Clubs or Organizations: No    Attends Banker Meetings: Never    Marital Status: Married  Catering manager Violence: Not At Risk (09/20/2023)   Humiliation, Afraid, Rape, and Kick questionnaire    Fear of Current or Ex-Partner: No    Emotionally Abused: No    Physically Abused: No  Sexually Abused: No    Family History: Family History  Problem Relation Age of Onset   Hypertension Mother    Coronary artery disease Mother    Diabetes Mother    Hypertension Sister    Coronary artery disease Sister    Hypertension Brother    Heart attack Father    Hypertension Son    Heart attack Maternal Aunt    Hypertension  Maternal Aunt    Diabetes Maternal Aunt    Heart attack Maternal Uncle    Hypertension Maternal Uncle    Diabetes Maternal Uncle    Heart attack Paternal Aunt    Hypertension Paternal Aunt    Diabetes Paternal Aunt    Heart attack Paternal Uncle    Hypertension Paternal Uncle    Diabetes Paternal Uncle    Heart attack Maternal Grandmother    Heart attack Maternal Grandfather    Heart attack Paternal Grandmother    Heart attack Paternal Grandfather    Colon cancer Neg Hx     Current Medications:  Current Outpatient Medications:    aspirin EC 81 MG tablet, Take 81 mg by mouth daily., Disp: , Rfl:    atorvastatin (LIPITOR) 20 MG tablet, TAKE 1 TABLET(20 MG) BY MOUTH DAILY, Disp: 90 tablet, Rfl: 0   B Complex-C-Zn-Folic Acid (DIALYVITE 800-ZINC 15) 0.8 MG TABS, Take 1 tablet by mouth daily., Disp: , Rfl:    cinacalcet (SENSIPAR) 60 MG tablet, Take 60 mg by mouth daily., Disp: , Rfl:    Darbepoetin Alfa (ARANESP, ALBUMIN FREE, IJ), Darbepoetin Alfa (Aranesp), Disp: , Rfl:    famotidine (PEPCID) 40 MG tablet, Take 1 tablet (40 mg total) by mouth daily., Disp: 30 tablet, Rfl: 3   fluticasone (FLONASE) 50 MCG/ACT nasal spray, Place 2 sprays into both nostrils daily. (Patient taking differently: Place 2 sprays into both nostrils daily as needed for allergies or rhinitis.), Disp: 16 g, Rfl: 6   gabapentin (NEURONTIN) 300 MG capsule, TAKE 1 CAPSULE(300 MG) BY MOUTH AT BEDTIME, Disp: 90 capsule, Rfl: 3   iron sucrose (VENOFER) 20 MG/ML injection, Inject 50 mg into the vein once a week., Disp: , Rfl:    lanthanum (FOSRENOL) 1000 MG chewable tablet, Chew 2,000-3,000 mg by mouth See admin instructions. Take 3 tablets (3000 mg) by mouth with meals and take 2 tablets (2000 mg) with snacks, Disp: , Rfl:    lidocaine-prilocaine (EMLA) cream, APPLY TOPICALLY TO THE AFFECTED AREA 1 TIME, Disp: 30 g, Rfl: 3   meclizine (ANTIVERT) 25 MG tablet, Take 1 tablet (25 mg total) by mouth 3 (three) times daily as  needed for dizziness., Disp: 30 tablet, Rfl: 1   nebivolol (BYSTOLIC) 10 MG tablet, TAKE 1 TABLET BY MOUTH IN THE MORNING AND AT BEDTIME, Disp: 180 tablet, Rfl: 0   nitroGLYCERIN (NITROSTAT) 0.4 MG SL tablet, Place 1 tablet (0.4 mg total) under the tongue every 5 (five) minutes x 3 doses as needed for chest pain (if no relief after 3rd dose, proceed to ED or call 911)., Disp: 25 tablet, Rfl: 3   oxyCODONE (ROXICODONE) 5 MG immediate release tablet, Take 1 tablet (5 mg total) by mouth every 6 (six) hours as needed., Disp: 30 tablet, Rfl: 0   pantoprazole (PROTONIX) 40 MG tablet, Take 1 tablet (40 mg total) by mouth 2 (two) times daily before a meal., Disp: 60 tablet, Rfl: 11   sucralfate (CARAFATE) 1 g tablet, Take 1 tablet (1 g total) by mouth 4 (four) times daily -  before  meals and at bedtime. Crush in small amount of water and mix., Disp: 120 tablet, Rfl: 1   amLODipine (NORVASC) 10 MG tablet, TAKE 1 TABLET BY MOUTH AT BEDTIME (Patient not taking: Reported on 11/13/2023), Disp: 90 tablet, Rfl: 3   Allergies: Allergies  Allergen Reactions   Motrin [Ibuprofen] Other (See Comments)    ESRD     REVIEW OF SYSTEMS:   Review of Systems  Constitutional:  Negative for chills, fatigue and fever.  HENT:   Negative for lump/mass, mouth sores, nosebleeds, sore throat and trouble swallowing.   Eyes:  Negative for eye problems.  Respiratory:  Negative for cough and shortness of breath.   Cardiovascular:  Positive for chest pain. Negative for leg swelling and palpitations.  Gastrointestinal:  Negative for abdominal pain, constipation, diarrhea, nausea and vomiting.  Genitourinary:  Negative for bladder incontinence, difficulty urinating, dysuria, frequency, hematuria and nocturia.   Musculoskeletal:  Negative for arthralgias, back pain, flank pain, myalgias and neck pain.  Skin:  Negative for itching and rash.  Neurological:  Positive for dizziness and numbness. Negative for headaches.  Hematological:   Does not bruise/bleed easily.  Psychiatric/Behavioral:  Negative for depression, sleep disturbance and suicidal ideas. The patient is not nervous/anxious.   All other systems reviewed and are negative.    VITALS:   Blood pressure 128/65, pulse 77, temperature 98 F (36.7 C), temperature source Oral, resp. rate 16, weight 176 lb 2.4 oz (79.9 kg), SpO2 99%.  Wt Readings from Last 3 Encounters:  11/13/23 176 lb 2.4 oz (79.9 kg)  11/02/23 172 lb (78 kg)  10/29/23 173 lb (78.5 kg)    Body mass index is 32.74 kg/m.  Performance status (ECOG): 1 - Symptomatic but completely ambulatory  PHYSICAL EXAM:   Physical Exam Vitals and nursing note reviewed. Exam conducted with a chaperone present.  Constitutional:      Appearance: Normal appearance.  Cardiovascular:     Rate and Rhythm: Normal rate and regular rhythm.     Pulses: Normal pulses.     Heart sounds: Normal heart sounds.  Pulmonary:     Effort: Pulmonary effort is normal.     Breath sounds: Normal breath sounds.  Abdominal:     Palpations: Abdomen is soft. There is no hepatomegaly, splenomegaly or mass.     Tenderness: There is no abdominal tenderness.  Musculoskeletal:     Right lower leg: No edema.     Left lower leg: No edema.  Lymphadenopathy:     Cervical: No cervical adenopathy.     Right cervical: No superficial, deep or posterior cervical adenopathy.    Left cervical: No superficial, deep or posterior cervical adenopathy.     Upper Body:     Right upper body: No supraclavicular or axillary adenopathy.     Left upper body: No supraclavicular or axillary adenopathy.  Neurological:     General: No focal deficit present.     Mental Status: She is alert and oriented to person, place, and time.  Psychiatric:        Mood and Affect: Mood normal.        Behavior: Behavior normal.    LABS:      Latest Ref Rng & Units 11/07/2023   11:02 AM 10/29/2023    9:30 AM 10/14/2023   11:39 AM  CBC  WBC 4.0 - 10.5 K/uL 7.8   6.0  5.1   Hemoglobin 12.0 - 15.0 g/dL 8.7  8.4  6.6   Hematocrit 36.0 -  46.0 % 26.1  26.8  20.4   Platelets 150 - 400 K/uL 196  153  117       Latest Ref Rng & Units 11/07/2023   11:02 AM 10/29/2023    9:30 AM 10/14/2023   11:39 AM  CMP  Glucose 70 - 99 mg/dL 409  811  914   BUN 8 - 23 mg/dL 19  19  34   Creatinine 0.44 - 1.00 mg/dL 7.82  9.56  21.30   Sodium 135 - 145 mmol/L 136  137  134   Potassium 3.5 - 5.1 mmol/L 2.9  2.7  3.8   Chloride 98 - 111 mmol/L 94  93  94   CO2 22 - 32 mmol/L 27  27  25    Calcium 8.9 - 10.3 mg/dL 9.7  9.6  7.9   Total Protein 6.5 - 8.1 g/dL 7.1   6.4   Total Bilirubin 0.0 - 1.2 mg/dL 0.5   1.0   Alkaline Phos 38 - 126 U/L 150   125   AST 15 - 41 U/L 23   38   ALT 0 - 44 U/L 19   19      No results found for: "CEA1", "CEA" / No results found for: "CEA1", "CEA" No results found for: "PSA1" No results found for: "QMV784" No results found for: "CAN125"  No results found for: "TOTALPROTELP", "ALBUMINELP", "A1GS", "A2GS", "BETS", "BETA2SER", "GAMS", "MSPIKE", "SPEI" Lab Results  Component Value Date   TIBC 195 (L) 02/15/2021   TIBC 190 (L) 12/10/2018   TIBC 218 (L) 06/08/2016   FERRITIN 1,742 (H) 02/15/2021   FERRITIN 1,308 (H) 12/10/2018   FERRITIN 126 06/08/2016   IRONPCTSAT 15 02/15/2021   IRONPCTSAT 43 (H) 12/10/2018   IRONPCTSAT 22 06/08/2016   No results found for: "LDH"   STUDIES:   CT CHEST ABDOMEN PELVIS W CONTRAST Result Date: 11/11/2023 CLINICAL DATA:  Metastatic breast cancer restaging, right breast cancer status post mastectomy with chest wall recurrence * Tracking Code: BO * EXAM: CT CHEST, ABDOMEN, AND PELVIS WITH CONTRAST TECHNIQUE: Multidetector CT imaging of the chest, abdomen and pelvis was performed following the standard protocol during bolus administration of intravenous contrast. RADIATION DOSE REDUCTION: This exam was performed according to the departmental dose-optimization program which includes automated exposure  control, adjustment of the mA and/or kV according to patient size and/or use of iterative reconstruction technique. CONTRAST:  OMNIPAQUE IOHEXOL 300 MG/ML  SOLN COMPARISON:  PET-CT, 05/31/2023 CT chest angiogram, 07/03/2020 FINDINGS: CT CHEST FINDINGS Cardiovascular: Right chest port catheter. Aortic atherosclerosis. Normal heart size. Left and right coronary artery calcifications. No pericardial effusion. Mediastinum/Nodes: No enlarged mediastinal, hilar, or axillary lymph nodes. Thyroid gland, trachea, and esophagus demonstrate no significant findings. Lungs/Pleura: Subpleural radiation fibrosis of the anterior right lung. Unchanged irregular subsolid nodule of the peripheral right lower lobe measuring 0.7 cm (series 3, image 76). Additional tiny nodules of the dependent right lower lobe measuring up to 0.4 cm, difficult to clearly appreciate on prior free breathing PET-CT and obscured by pleural effusions on more remote CT chest dated 07/03/2020 (series 3, image 64). No pleural effusion or pneumothorax. Musculoskeletal: Status post right mastectomy. No significant change in appearance of infiltrative soft tissue thickening involving the right pectoral musculature (series 2, image 22). Status post right axillary lymph node dissection. No acute osseous findings. CT ABDOMEN PELVIS FINDINGS Hepatobiliary: No focal liver abnormality is seen. Hepatic steatosis. Status post cholecystectomy. No biliary dilatation. Pancreas: Unremarkable. No pancreatic ductal  dilatation or surrounding inflammatory changes. Spleen: Normal in size without significant abnormality. Adrenals/Urinary Tract: Adrenal glands are unremarkable. Atrophic kidneys. Numerous bilateral nonobstructive renal calculi and bilateral renal cortical cysts. No hydronephrosis. Bladder is unremarkable. Stomach/Bowel: Stomach is within normal limits. Appendix appears normal. No evidence of bowel wall thickening, distention, or inflammatory changes. Dense  barium contrast in the colon. Vascular/Lymphatic: Aortic atherosclerosis. No enlarged abdominal or pelvic lymph nodes. Reproductive: Status post hysterectomy. Other: No abdominal wall hernia or abnormality. No ascites. Musculoskeletal: No acute osseous findings. IMPRESSION: 1. Status post right mastectomy and right axillary lymph node dissection. No significant change in appearance of infiltrative soft tissue thickening involving the right pectoral musculature, consistent with known local recurrence and previously with low-level residual FDG avidity. 2. Unchanged irregular subsolid nodule of the peripheral right lower lobe measuring 0.7 cm stable since examination dated 2021 and therefore almost certainly benign and incidental. Additional tiny nodules of the dependent right lower lobe measuring up to 0.4 cm, difficult to clearly appreciate on prior free breathing PET-CT and obscured by pleural effusions on more remote CT chest dated 07/03/2020. Attention on follow-up for these nonspecific nodules. 3. No evidence of lymphadenopathy or metastatic disease in the abdomen or pelvis. 4. Hepatic steatosis. 5. Atrophic kidneys. Numerous bilateral nonobstructive renal calculi and bilateral renal cortical cysts. 6. Coronary artery disease. Aortic Atherosclerosis (ICD10-I70.0). Electronically Signed   By: Jearld Lesch M.D.   On: 11/11/2023 21:58   DG Chest 2 View Result Date: 10/29/2023 CLINICAL DATA:  Chest pain. EXAM: CHEST - 2 VIEW COMPARISON:  06/07/2023. FINDINGS: Bilateral lung fields are clear. Bilateral costophrenic angles are clear. Normal cardio-mediastinal silhouette. No acute osseous abnormalities. The soft tissues are within normal limits. Right-sided CT Port-A-Cath is seen with its tip overlying the upper portion of superior vena cava, unchanged. IMPRESSION: No active cardiopulmonary disease. Electronically Signed   By: Jules Schick M.D.   On: 10/29/2023 11:15

## 2023-11-13 ENCOUNTER — Telehealth: Payer: Self-pay | Admitting: *Deleted

## 2023-11-13 ENCOUNTER — Encounter (HOSPITAL_COMMUNITY): Payer: Self-pay | Admitting: Hematology

## 2023-11-13 ENCOUNTER — Encounter: Payer: Self-pay | Admitting: General Surgery

## 2023-11-13 ENCOUNTER — Inpatient Hospital Stay: Attending: Hematology | Admitting: Hematology

## 2023-11-13 ENCOUNTER — Ambulatory Visit (INDEPENDENT_AMBULATORY_CARE_PROVIDER_SITE_OTHER): Admitting: General Surgery

## 2023-11-13 VITALS — BP 128/65 | HR 77 | Temp 98.0°F | Resp 16 | Wt 176.1 lb

## 2023-11-13 DIAGNOSIS — C50012 Malignant neoplasm of nipple and areola, left female breast: Secondary | ICD-10-CM | POA: Diagnosis not present

## 2023-11-13 DIAGNOSIS — R918 Other nonspecific abnormal finding of lung field: Secondary | ICD-10-CM | POA: Diagnosis not present

## 2023-11-13 DIAGNOSIS — Z09 Encounter for follow-up examination after completed treatment for conditions other than malignant neoplasm: Secondary | ICD-10-CM

## 2023-11-13 DIAGNOSIS — Z853 Personal history of malignant neoplasm of breast: Secondary | ICD-10-CM | POA: Diagnosis not present

## 2023-11-13 DIAGNOSIS — I10 Essential (primary) hypertension: Secondary | ICD-10-CM | POA: Diagnosis not present

## 2023-11-13 DIAGNOSIS — Z9221 Personal history of antineoplastic chemotherapy: Secondary | ICD-10-CM | POA: Insufficient documentation

## 2023-11-13 DIAGNOSIS — Z79899 Other long term (current) drug therapy: Secondary | ICD-10-CM | POA: Insufficient documentation

## 2023-11-13 DIAGNOSIS — Z992 Dependence on renal dialysis: Secondary | ICD-10-CM | POA: Diagnosis not present

## 2023-11-13 DIAGNOSIS — R0789 Other chest pain: Secondary | ICD-10-CM | POA: Insufficient documentation

## 2023-11-13 DIAGNOSIS — C50111 Malignant neoplasm of central portion of right female breast: Secondary | ICD-10-CM

## 2023-11-13 DIAGNOSIS — N186 End stage renal disease: Secondary | ICD-10-CM | POA: Diagnosis not present

## 2023-11-13 DIAGNOSIS — Z08 Encounter for follow-up examination after completed treatment for malignant neoplasm: Secondary | ICD-10-CM

## 2023-11-13 DIAGNOSIS — G629 Polyneuropathy, unspecified: Secondary | ICD-10-CM | POA: Insufficient documentation

## 2023-11-13 DIAGNOSIS — C50011 Malignant neoplasm of nipple and areola, right female breast: Secondary | ICD-10-CM | POA: Diagnosis not present

## 2023-11-13 DIAGNOSIS — C799 Secondary malignant neoplasm of unspecified site: Secondary | ICD-10-CM | POA: Diagnosis not present

## 2023-11-13 DIAGNOSIS — N040 Nephrotic syndrome with minor glomerular abnormality: Secondary | ICD-10-CM | POA: Diagnosis not present

## 2023-11-13 DIAGNOSIS — Z171 Estrogen receptor negative status [ER-]: Secondary | ICD-10-CM

## 2023-11-13 DIAGNOSIS — R748 Abnormal levels of other serum enzymes: Secondary | ICD-10-CM | POA: Diagnosis not present

## 2023-11-13 DIAGNOSIS — Z95828 Presence of other vascular implants and grafts: Secondary | ICD-10-CM

## 2023-11-13 NOTE — Patient Instructions (Addendum)
 [facility] at [facility] Discharge Instructions   You were seen and examined today by Dr. Ellin Saba.  He reviewed the results of your lab work which are normal/stable.   He reviewed the results of your CT scan. Overall the results are good. There was a tiny spot on the lung that is new, but nothing to be concerned about. We will just continue to monitor. There is a spot in the lung that has been there since 2021 that is unchanged and therefore benign.   We will see you back in .   Return as scheduled.    Thank you for choosing [facility] at [facility] to provide your oncology and hematology care.  To afford each patient quality time with our provider, please arrive at least 15 minutes before your scheduled appointment time.   If you have a lab appointment with the Cancer Center please come in thru the Main Entrance and check in at the main information desk.  You need to re-schedule your appointment should you arrive 10 or more minutes late.  We strive to give you quality time with our providers, and arriving late affects you and other patients whose appointments are after yours.  Also, if you no show three or more times for appointments you may be dismissed from the clinic at the providers discretion.     Again, thank you for choosing [facility].  Our hope is that these requests will decrease the amount of time that you wait before being seen by our physicians.       _____________________________________________________________  Should you have questions after your visit to [facility], please contact our office at [phone] and follow the prompts.  Our office hours are 8:00 a.m. and 4:30 p.m. Monday - Friday.  Please note that voicemails left after 4:00 p.m. may not be returned until the following business day.  We are closed weekends and major holidays.  You do have access to a nurse 24-7, just call the main  number to the clinic [phone] and do not press any options, hold on the line and a nurse will answer the phone.    For prescription refill requests, have your pharmacy contact our office and allow 72 hours.    Due to Covid, you will need to wear a mask upon entering the hospital. If you do not have a mask, a mask will be given to you at the Main Entrance upon arrival. For doctor visits, patients may have 1 support person age 69 or older with them. For treatment visits, patients can not have anyone with them due to social distancing guidelines and our immunocompromised population.

## 2023-11-13 NOTE — Telephone Encounter (Signed)
 Patient seen in office to schedule port removal.

## 2023-11-13 NOTE — H&P (Signed)
 Megan [name] is an 63 y.o. female.   Chief Complaint: Port-A-Cath pain HPI: Patient is a 63 year old black female with multiple medical cryoprobes including recurrent breast cancer who was referred to my care by Dr. Ellin Saba for Port-A-Cath removal.  She states she is having pain at the Port-A-Cath site and would like it removed.  She denies any purulent drainage from the port.  Past Medical History:  Diagnosis Date   (HFpEF) heart failure with preserved ejection fraction (HCC)    a. 01/2019 Echo: EF 55-60%, mild conc LVH. DD.  Torn MV chordae.   Anemia    Atypical chest pain    a. 08/2018 MV: EF 59%, no ischemia; b. 02/2019 Cath: nonobs dzs.   Blood transfusion without reported diagnosis    Breast cancer (HCC) 10/12/2020   Cataract    ESRD (end stage renal disease) on dialysis [facility])    a. HD T, T, S   Essential hypertension, benign    GERD (gastroesophageal reflux disease)    Headache    Hemorrhoids    Mixed hyperlipidemia    Morbid obesity (HCC)    Non-obstructive CAD (coronary artery disease)    a. 02/2019 CathL LM nl, LAD 7m, LCX nl, RCA 25p, EF 55-65%.   PONV (postoperative nausea and vomiting)    Renal insufficiency    S/P colonoscopy 08/2009   Dr. Elnoria Howard: sessile polyp (benign lymphoid), large hemorrhoids, repeat 5-10 years   Temporal arteritis (HCC)    Type 2 diabetes mellitus (HCC)    Wears glasses     Past Surgical History:  Procedure Laterality Date   ABDOMINAL HYSTERECTOMY     APPENDECTOMY     ARTERY BIOPSY N/A 05/09/2018   Procedure: RIGHT TEMPORAL ARTERY BIOPSY;  Surgeon: Jimmye Norman, MD;  Location: [place] OR;  Service: General;  Laterality: N/A;   BIOPSY  10/04/2023   Procedure: BIOPSY;  Surgeon: Corbin Ade, MD;  [facility];  Service: Endoscopy;;   BREAST BIOPSY Right 06/15/2020   Procedure: RIGHT BREAST BIOPSY;  Surgeon: Franky Macho, MD;  Location: [facility] ORS;  Service: General;  Laterality: Right;   CATARACT EXTRACTION W/PHACO Left  02/09/2017   Procedure: CATARACT EXTRACTION PHACO AND INTRAOCULAR LENS PLACEMENT LEFT EYE;  Surgeon: Gemma Payor, MD;  Location: [facility] ORS;  Service: Ophthalmology;  Laterality: Left;  CDE: 4.89   CATARACT EXTRACTION W/PHACO Right 06/04/2017   Procedure: CATARACT EXTRACTION PHACO AND INTRAOCULAR LENS PLACEMENT (IOC);  Surgeon: Gemma Payor, MD;  Location: [facility];  Service: Ophthalmology;  Laterality: Right;  CDE: 4.12   CHOLECYSTECTOMY  09/29/2011   Procedure: LAPAROSCOPIC CHOLECYSTECTOMY;  Surgeon: Dalia Heading, MD;  Location: [facility];  Service: General;  Laterality: N/A;   COLONOSCOPY  08/2009   Dr. Elnoria Howard: sessile polyp (benign lymphoid), large hemorrhoids, repeat 5-10 years   COLONOSCOPY N/A 06/12/2016   prominent hemorrhoids   COLONOSCOPY WITH PROPOFOL N/A 06/16/2021   Procedure: COLONOSCOPY WITH PROPOFOL;  Surgeon: Corbin Ade, MD;  [facility];  Service: Endoscopy;  Laterality: N/A;  9:30am (dialysis pt)   ESOPHAGOGASTRODUODENOSCOPY  09/05/2011   [id number] hiatal hernia; remainder of exam normal. No explanation for patient's abdominal pain with today's examination   ESOPHAGOGASTRODUODENOSCOPY N/A 12/17/2013   Dr. Jena Gauss: gastric erythema, erosion, mild chronic inflammation on path    ESOPHAGOGASTRODUODENOSCOPY (EGD) WITH PROPOFOL N/A 06/16/2021   Procedure: ESOPHAGOGASTRODUODENOSCOPY (EGD) WITH PROPOFOL;  Surgeon: Corbin Ade, MD;  [facility];  Service: Endoscopy;  Laterality: N/A;   ESOPHAGOGASTRODUODENOSCOPY (  EGD) WITH PROPOFOL N/A 10/04/2023   Procedure: ESOPHAGOGASTRODUODENOSCOPY (EGD) WITH PROPOFOL;  Surgeon: Corbin Ade, MD;  [facility];  Service: Endoscopy;  Laterality: N/A;  200pm, ok rm 1-2, pt knows to arrive at 6:45   EXCISION OF BREAST BIOPSY Right 10/12/2020   Procedure: EXCISION OF RIGHT BREAST BIOPSY;  Surgeon: Franky Macho, MD;  Location: [facility];  Service: General;  Laterality: Right;   IR DIALY SHUNT INTRO NEEDLE/INTRACATH  INITIAL W/IMG LEFT Left 07/17/2023   LAPAROSCOPIC APPENDECTOMY  09/29/2011   Procedure: APPENDECTOMY LAPAROSCOPIC;  Surgeon: Dalia Heading, MD;  Location: [facility];  Service: General;;  incidental appendectomy   LEFT HEART CATH AND CORONARY ANGIOGRAPHY N/A 02/28/2019   Procedure: LEFT HEART CATH AND CORONARY ANGIOGRAPHY;  Surgeon: Corky Crafts, MD;  Location: [facility] INVASIVE CV LAB;  Service: Cardiovascular;  Laterality: N/A;   MASS EXCISION Right 01/18/2023   Procedure: EXCISION MASS, RIGHT CHEST S/P MASTECTOMY;  Surgeon: Franky Macho, MD;  Location: [facility];  Service: General;  Laterality: Right;   MASTECTOMY MODIFIED RADICAL Right 02/18/2020   Procedure: MASTECTOMY MODIFIED RADICAL;  Surgeon: Franky Macho, MD;  Location: [facility];  Service: General;  Laterality: Right;   MASTECTOMY, PARTIAL Right 07/13/2020   Procedure: RIGHT PARTIAL MASTECTOMY;  Surgeon: Franky Macho, MD;  Location: [facility];  Service: General;  Laterality: Right;   PARTIAL MASTECTOMY WITH NEEDLE LOCALIZATION AND AXILLARY SENTINEL LYMPH NODE BX Right 09/18/2018   Procedure: RIGHT PARTIAL MASTECTOMY AFTER NEEDLE LOCALIZATION, SENTINEL LYMPH NODE BIOPSY RIGHT AXILLA;  Surgeon: Franky Macho, MD;  Location: [facility];  Service: General;  Laterality: Right;   POLYPECTOMY  06/16/2021   Procedure: POLYPECTOMY;  Surgeon: Corbin Ade, MD;  [facility];  Service: Endoscopy;;   PORTACATH PLACEMENT Right 06/07/2023   Procedure: INSERTION PORT-A-CATH, RIGHT  (DIALYSIS ACCESS ON LEFT);  Surgeon: Franky Macho, MD;  Location: [facility];  Service: General;  Laterality: Right;   SIMPLE MASTECTOMY WITH AXILLARY SENTINEL NODE BIOPSY Left 06/15/2020   Procedure: LEFT SIMPLE MASTECTOMY;  Surgeon: Franky Macho, MD;  Location: [facility];  Service: General;  Laterality: Left;    Family History  Problem Relation Age of Onset   Hypertension Mother    Coronary artery disease Mother    Diabetes Mother    Hypertension Sister    Coronary  artery disease Sister    Hypertension Brother    Heart attack Father    Hypertension Son    Heart attack Maternal Aunt    Hypertension Maternal Aunt    Diabetes Maternal Aunt    Heart attack Maternal Uncle    Hypertension Maternal Uncle    Diabetes Maternal Uncle    Heart attack Paternal Aunt    Hypertension Paternal Aunt    Diabetes Paternal Aunt    Heart attack Paternal Uncle    Hypertension Paternal Uncle    Diabetes Paternal Uncle    Heart attack Maternal Grandmother    Heart attack Maternal Grandfather    Heart attack Paternal Grandmother    Heart attack Paternal Grandfather    Colon cancer Neg Hx    Social History:  reports that she has never smoked. She has never been exposed to tobacco smoke. She has never used smokeless tobacco. She reports that she does not drink alcohol and does not use drugs.  Allergies:  Allergies  Allergen Reactions   Motrin [Ibuprofen] Other (See Comments)    ESRD     No medications prior to admission.  No results found. However, due to the size of the patient record, not all encounters were searched. Please check Results Review for a complete set of results. No results found.  Review of Systems  Constitutional:  Positive for fatigue.  HENT: Negative.    Eyes: Negative.   Respiratory: Negative.    Cardiovascular:  Positive for chest pain.  Gastrointestinal: Negative.   Endocrine: Negative.   Genitourinary: Negative.   Musculoskeletal: Negative.   Allergic/Immunologic: Positive for immunocompromised state.  Neurological: Negative.   Hematological: Negative.   Psychiatric/Behavioral: Negative.      There were no vitals taken for this visit. Physical Exam Vitals reviewed.  Constitutional:      Appearance: Normal appearance. She is not ill-appearing.  HENT:     Head: Normocephalic and atraumatic.  Cardiovascular:     Rate and Rhythm: Normal rate and regular rhythm.     Heart sounds: Normal heart sounds. No murmur heard.     No friction rub. No gallop.  Pulmonary:     Effort: Pulmonary effort is normal. No respiratory distress.     Breath sounds: Normal breath sounds. No stridor. No wheezing, rhonchi or rales.  Skin:    General: Skin is warm and dry.     Comments: Port-A-Cath in place right upper chest.  Neurological:     Mental Status: She is alert and oriented to person, place, and time.      Assessment/Plan Impression: Port-A-Cath pain Plan: Patient is scheduled for removal of the Port-A-Cath in the minor procedure room on 11/29/2023.  The risks and benefits of the procedure were fully explained to the patient, who gave informed consent.  Franky Macho, MD 11/13/2023, 6:19 PM

## 2023-11-13 NOTE — Telephone Encounter (Signed)
 Requested medication (s) are due for refill today - yes  Requested medication (s) are on the active medication list -yes  Future visit scheduled -no  Last refill: 08/13/23 #90  Notes to clinic: fails lab protocol- over 1 year-03/02/22  Requested Prescriptions  Pending Prescriptions Disp Refills   atorvastatin (LIPITOR) 20 MG tablet [Pharmacy Med Name: ATORVASTATIN 20MG  TABLETS] 90 tablet 0    Sig: TAKE 1 TABLET(20 MG) BY MOUTH DAILY     Cardiovascular:  Antilipid - Statins Failed - 11/13/2023  4:26 PM      Failed - Lipid Panel in normal range within the last 12 months    Cholesterol  Date Value Ref Range Status  11/25/2020 141 <200 mg/dL Final   LDL Cholesterol (Calc)  Date Value Ref Range Status  11/25/2020 72 mg/dL (calc) Final    Comment:    Reference range: <100 . Desirable range <100 mg/dL for primary prevention;   <70 mg/dL for patients with CHD or diabetic patients  with > or = 2 CHD risk factors. Marland Kitchen LDL-C is now calculated using the Martin-Hopkins  calculation, which is a validated novel method providing  better accuracy than the Friedewald equation in the  estimation of LDL-C.  Horald Pollen et al. Lenox Ahr. 5284;132(44): 2061-2068  (http://education.[facility].com/faq/FAQ164)    Direct LDL  Date Value Ref Range Status  03/02/2022 105 (H) <100 mg/dL Final    Comment:    Greatly elevated Triglycerides values (>1200 mg/dL) interfere with the dLDL assay. As no Triglycerides  testing was ordered, interpret results with caution. . Desirable range <100 mg/dL for primary prevention;   <70 mg/dL for patients with CHD or diabetic patients  with > or = 2 CHD risk factors. Marland Kitchen    HDL  Date Value Ref Range Status  11/25/2020 55 > OR = 50 mg/dL Final   Triglycerides  Date Value Ref Range Status  11/25/2020 67 <150 mg/dL Final         Passed - Patient is not pregnant      Passed - Valid encounter within last 12 months    Recent Outpatient Visits           1  week ago Atypical chest pain   [place] [facility] Medicine Donita Brooks, MD   1 month ago Gastroesophageal reflux disease without esophagitis   [place] [facility] Donita Brooks, MD   5 months ago Elevated blood sugar   [place] [facility] Tanya Nones, Priscille Heidelberg, MD   1 year ago Chest pressure   [place] [facility] [place] Meo, FNP   1 year ago Neuropathic pain   [place] [facility] Pickard, Priscille Heidelberg, MD       Future Appointments             In 3 months [name], MD [facility] - A Dept Of [place]. [facility]               Requested Prescriptions  Pending Prescriptions Disp Refills   atorvastatin (LIPITOR) 20 MG tablet [Pharmacy Med Name: ATORVASTATIN 20MG  TABLETS] 90 tablet 0    Sig: TAKE 1 TABLET(20 MG) BY MOUTH DAILY     Cardiovascular:  Antilipid - Statins Failed - 11/13/2023  4:26 PM      Failed - Lipid Panel in normal range within the last 12 months    Cholesterol  Date Value Ref Range  Status  11/25/2020 141 <200 mg/dL Final   LDL Cholesterol (Calc)  Date Value Ref Range Status  11/25/2020 72 mg/dL (calc) Final    Comment:    Reference range: <100 . Desirable range <100 mg/dL for primary prevention;   <70 mg/dL for patients with CHD or diabetic patients  with > or = 2 CHD risk factors. Marland Kitchen LDL-C is now calculated using the Martin-Hopkins  calculation, which is a validated novel method providing  better accuracy than the Friedewald equation in the  estimation of LDL-C.  Horald Pollen et al. Lenox Ahr. 1610;960(45): 2061-2068  (http://education.[facility].com/faq/FAQ164)    Direct LDL  Date Value Ref Range Status  03/02/2022 105 (H) <100 mg/dL Final    Comment:    Greatly elevated Triglycerides values (>1200 mg/dL) interfere with the dLDL assay. As no Triglycerides  testing was ordered, interpret results with  caution. . Desirable range <100 mg/dL for primary prevention;   <70 mg/dL for patients with CHD or diabetic patients  with > or = 2 CHD risk factors. Marland Kitchen    HDL  Date Value Ref Range Status  11/25/2020 55 > OR = 50 mg/dL Final   Triglycerides  Date Value Ref Range Status  11/25/2020 67 <150 mg/dL Final         Passed - Patient is not pregnant      Passed - Valid encounter within last 12 months    Recent Outpatient Visits           1 week ago Atypical chest pain   [place] [facility] Medicine Donita Brooks, MD   1 month ago Gastroesophageal reflux disease without esophagitis   [place] [facility] Donita Brooks, MD   5 months ago Elevated blood sugar   [place] [facility] Tanya Nones, Priscille Heidelberg, MD   1 year ago Chest pressure   [place] [facility] Family Medicine Park Meo, FNP   1 year ago Neuropathic pain   [place] [facility] Medicine Pickard, Priscille Heidelberg, MD       Future Appointments             In 3 months [name], MD [facility] - A Dept Of [place]. [facility]

## 2023-11-14 ENCOUNTER — Other Ambulatory Visit: Payer: Self-pay

## 2023-11-14 DIAGNOSIS — D631 Anemia in chronic kidney disease: Secondary | ICD-10-CM | POA: Diagnosis not present

## 2023-11-14 DIAGNOSIS — E1129 Type 2 diabetes mellitus with other diabetic kidney complication: Secondary | ICD-10-CM | POA: Diagnosis not present

## 2023-11-14 DIAGNOSIS — Z992 Dependence on renal dialysis: Secondary | ICD-10-CM | POA: Diagnosis not present

## 2023-11-14 DIAGNOSIS — N2581 Secondary hyperparathyroidism of renal origin: Secondary | ICD-10-CM | POA: Diagnosis not present

## 2023-11-14 DIAGNOSIS — N186 End stage renal disease: Secondary | ICD-10-CM | POA: Diagnosis not present

## 2023-11-14 DIAGNOSIS — E119 Type 2 diabetes mellitus without complications: Secondary | ICD-10-CM | POA: Diagnosis not present

## 2023-11-15 ENCOUNTER — Inpatient Hospital Stay: Payer: Medicare Other | Admitting: Hematology

## 2023-11-15 NOTE — Progress Notes (Signed)
 Visit cancelled.

## 2023-11-16 DIAGNOSIS — E119 Type 2 diabetes mellitus without complications: Secondary | ICD-10-CM | POA: Diagnosis not present

## 2023-11-16 DIAGNOSIS — Z992 Dependence on renal dialysis: Secondary | ICD-10-CM | POA: Diagnosis not present

## 2023-11-16 DIAGNOSIS — N186 End stage renal disease: Secondary | ICD-10-CM | POA: Diagnosis not present

## 2023-11-16 DIAGNOSIS — E1129 Type 2 diabetes mellitus with other diabetic kidney complication: Secondary | ICD-10-CM | POA: Diagnosis not present

## 2023-11-16 DIAGNOSIS — N2581 Secondary hyperparathyroidism of renal origin: Secondary | ICD-10-CM | POA: Diagnosis not present

## 2023-11-16 DIAGNOSIS — D631 Anemia in chronic kidney disease: Secondary | ICD-10-CM | POA: Diagnosis not present

## 2023-11-19 ENCOUNTER — Other Ambulatory Visit: Payer: Self-pay | Admitting: Family Medicine

## 2023-11-19 DIAGNOSIS — N2581 Secondary hyperparathyroidism of renal origin: Secondary | ICD-10-CM | POA: Diagnosis not present

## 2023-11-19 DIAGNOSIS — E1129 Type 2 diabetes mellitus with other diabetic kidney complication: Secondary | ICD-10-CM | POA: Diagnosis not present

## 2023-11-19 DIAGNOSIS — D631 Anemia in chronic kidney disease: Secondary | ICD-10-CM | POA: Diagnosis not present

## 2023-11-19 DIAGNOSIS — N186 End stage renal disease: Secondary | ICD-10-CM | POA: Diagnosis not present

## 2023-11-19 DIAGNOSIS — E119 Type 2 diabetes mellitus without complications: Secondary | ICD-10-CM | POA: Diagnosis not present

## 2023-11-19 DIAGNOSIS — Z992 Dependence on renal dialysis: Secondary | ICD-10-CM | POA: Diagnosis not present

## 2023-11-20 ENCOUNTER — Ambulatory Visit: Admitting: General Surgery

## 2023-11-20 NOTE — Telephone Encounter (Signed)
 Requested Prescriptions  Pending Prescriptions Disp Refills   nebivolol (BYSTOLIC) 10 MG tablet [Pharmacy Med Name: NEBIVOLOL 10MG  TABLETS] 180 tablet 0    Sig: TAKE 1 TABLET BY MOUTH IN THE MORNING AND AT BEDTIME     Cardiovascular: Beta Blockers 3 Failed - 11/20/2023 11:27 AM      Failed - Cr in normal range and within 360 days    Creat  Date Value Ref Range Status  08/26/2021 6.69 (H) 0.50 - 1.03 mg/dL Final   Creatinine, Ser  Date Value Ref Range Status  11/07/2023 5.68 (H) 0.44 - 1.00 mg/dL Final         Passed - AST in normal range and within 360 days    AST  Date Value Ref Range Status  11/07/2023 23 15 - 41 U/L Final         Passed - ALT in normal range and within 360 days    ALT  Date Value Ref Range Status  11/07/2023 19 0 - 44 U/L Final         Passed - Last BP in normal range    BP Readings from Last 1 Encounters:  11/13/23 128/65         Passed - Last Heart Rate in normal range    Pulse Readings from Last 1 Encounters:  11/13/23 77         Passed - Valid encounter within last 6 months    Recent Outpatient Visits           2 weeks ago Atypical chest pain   [place] [facility] Donita Brooks, MD   1 month ago Gastroesophageal reflux disease without esophagitis   [place] [facility] Donita Brooks, MD   5 months ago Elevated blood sugar   [place] [facility] Medicine Tanya Nones, Priscille Heidelberg, MD   1 year ago Chest pressure   [place] [facility] [place] Meo, FNP   1 year ago Neuropathic pain   [place] [facility] Medicine Pickard, Priscille Heidelberg, MD       Future Appointments             In 3 months [name], MD [facility] - A Dept Of . [facility]

## 2023-11-21 DIAGNOSIS — E1129 Type 2 diabetes mellitus with other diabetic kidney complication: Secondary | ICD-10-CM | POA: Diagnosis not present

## 2023-11-21 DIAGNOSIS — D631 Anemia in chronic kidney disease: Secondary | ICD-10-CM | POA: Diagnosis not present

## 2023-11-21 DIAGNOSIS — E119 Type 2 diabetes mellitus without complications: Secondary | ICD-10-CM | POA: Diagnosis not present

## 2023-11-21 DIAGNOSIS — Z992 Dependence on renal dialysis: Secondary | ICD-10-CM | POA: Diagnosis not present

## 2023-11-21 DIAGNOSIS — N2581 Secondary hyperparathyroidism of renal origin: Secondary | ICD-10-CM | POA: Diagnosis not present

## 2023-11-21 DIAGNOSIS — N186 End stage renal disease: Secondary | ICD-10-CM | POA: Diagnosis not present

## 2023-11-23 ENCOUNTER — Emergency Department (HOSPITAL_COMMUNITY)
Admission: EM | Admit: 2023-11-23 | Discharge: 2023-11-23 | Disposition: A | Discharge status: Home / Self Care | Attending: Emergency Medicine | Admitting: Emergency Medicine

## 2023-11-23 ENCOUNTER — Other Ambulatory Visit: Payer: Self-pay

## 2023-11-23 ENCOUNTER — Encounter (HOSPITAL_COMMUNITY): Payer: Self-pay | Admitting: Emergency Medicine

## 2023-11-23 DIAGNOSIS — Y732 Prosthetic and other implants, materials and accessory gastroenterology and urology devices associated with adverse incidents: Secondary | ICD-10-CM | POA: Diagnosis not present

## 2023-11-23 DIAGNOSIS — E1129 Type 2 diabetes mellitus with other diabetic kidney complication: Secondary | ICD-10-CM | POA: Diagnosis not present

## 2023-11-23 DIAGNOSIS — E119 Type 2 diabetes mellitus without complications: Secondary | ICD-10-CM | POA: Diagnosis not present

## 2023-11-23 DIAGNOSIS — Z992 Dependence on renal dialysis: Secondary | ICD-10-CM | POA: Insufficient documentation

## 2023-11-23 DIAGNOSIS — Z79899 Other long term (current) drug therapy: Secondary | ICD-10-CM | POA: Insufficient documentation

## 2023-11-23 DIAGNOSIS — T82590A Other mechanical complication of surgically created arteriovenous fistula, initial encounter: Secondary | ICD-10-CM | POA: Insufficient documentation

## 2023-11-23 DIAGNOSIS — D631 Anemia in chronic kidney disease: Secondary | ICD-10-CM | POA: Diagnosis not present

## 2023-11-23 DIAGNOSIS — N186 End stage renal disease: Secondary | ICD-10-CM | POA: Diagnosis not present

## 2023-11-23 DIAGNOSIS — Z7982 Long term (current) use of aspirin: Secondary | ICD-10-CM | POA: Diagnosis not present

## 2023-11-23 DIAGNOSIS — T82898A Other specified complication of vascular prosthetic devices, implants and grafts, initial encounter: Secondary | ICD-10-CM

## 2023-11-23 DIAGNOSIS — N2581 Secondary hyperparathyroidism of renal origin: Secondary | ICD-10-CM | POA: Diagnosis not present

## 2023-11-23 NOTE — ED Triage Notes (Signed)
 Pt reports around 1500 this afternoon she took her bandage off of her fistula and it was "squirting blood." Fistula wrapped in Abd pad and coban.

## 2023-11-23 NOTE — ED Provider Notes (Signed)
 [place] EMERGENCY DEPARTMENT AT [facility] Provider Note   CSN: [id number] Arrival date & time: 11/23/23  1512     History  Chief Complaint  Patient presents with   Bleeding/Bruising    Megan [name] is a 63 y.o. female.  She has history of ESRD is on dialysis Monday Wednesday Friday.  Presents to the ER for bleeding described as "spreading" from her fistula puncture site about 6 hours after dialysis today when she took her bandage off.  Her husband was able to put a pressure bandage on and they came to the ED.  HPI     Home Medications Prior to Admission medications   Medication Sig Start Date End Date Taking? Authorizing Provider  amLODipine (NORVASC) 10 MG tablet TAKE 1 TABLET BY MOUTH AT BEDTIME 08/07/23   Austine Lefort, MD  aspirin EC 81 MG tablet Take 81 mg by mouth daily.    [provider]  atorvastatin (LIPITOR) 20 MG tablet TAKE 1 TABLET(20 MG) BY MOUTH DAILY 11/13/23   Austine Lefort, MD  B Complex-C-Zn-Folic Acid (DIALYVITE 800-ZINC 15) 0.8 MG TABS Take 1 tablet by mouth daily. 06/19/22   [provider]  cinacalcet (SENSIPAR) 60 MG tablet Take 60 mg by mouth daily.    [provider]  Darbepoetin Alfa (ARANESP, ALBUMIN FREE, IJ) Darbepoetin Alfa (Aranesp) 07/06/23 07/04/24  [provider]  famotidine (PEPCID) 40 MG tablet Take 1 tablet (40 mg total) by mouth daily. Patient not taking: Reported on 11/22/2023 10/05/23   Austine Lefort, MD  fluticasone [facility]) 50 MCG/ACT nasal spray Place 2 sprays into both nostrils daily. 10/20/22   Austine Lefort, MD  gabapentin (NEURONTIN) 300 MG capsule TAKE 1 CAPSULE(300 MG) BY MOUTH AT BEDTIME 06/12/23   Austine Lefort, MD  iron sucrose (VENOFER) 20 MG/ML injection Inject 50 mg into the vein once a week. 02/19/23 02/18/24  [provider]  lanthanum (FOSRENOL) 1000 MG chewable tablet Chew 2,000-3,000 mg by mouth See admin instructions. Take 3 tablets (3000 mg)  by mouth with meals and take 2 tablets (2000 mg) with snacks 10/16/19   [provider]  lidocaine-prilocaine (EMLA) cream APPLY TOPICALLY TO THE AFFECTED AREA 1 TIME 10/01/23   Katragadda, Sreedhar, MD  meclizine (ANTIVERT) 25 MG tablet Take 1 tablet (25 mg total) by mouth 3 (three) times daily as needed for dizziness. 06/12/23   Austine Lefort, MD  nebivolol (BYSTOLIC) 10 MG tablet TAKE 1 TABLET BY MOUTH IN THE MORNING AND AT BEDTIME 11/20/23   Austine Lefort, MD  nitroGLYCERIN (NITROSTAT) 0.4 MG SL tablet Place 1 tablet (0.4 mg total) under the tongue every 5 (five) minutes x 3 doses as needed for chest pain (if no relief after 3rd dose, proceed to ED or call 911). 09/13/22   Laurann Pollock, MD  oxyCODONE (ROXICODONE) 5 MG immediate release tablet Take 1 tablet (5 mg total) by mouth every 6 (six) hours as needed. 06/07/23 06/06/24  [name], MD  pantoprazole (PROTONIX) 40 MG tablet Take 1 tablet (40 mg total) by mouth 2 (two) times daily before a meal. 10/08/23   Rourk, Windsor Hatcher, MD  sucralfate (CARAFATE) 1 g tablet Take 1 tablet (1 g total) by mouth 4 (four) times daily -  before meals and at bedtime. Crush in small amount of water and mix. 09/20/23   Delman Ferns, NP      Allergies    Motrin [ibuprofen]    Review  of Systems   Review of Systems  Physical Exam Updated Vital Signs BP 134/69 (BP Location: Right Arm)   Pulse 80   Temp 98.4 F (36.9 C) (Oral)   Resp 18   SpO2 97%  Physical Exam Vitals and nursing note reviewed.  Constitutional:      General: She is not in acute distress.    Appearance: She is well-developed.  HENT:     Head: Normocephalic and atraumatic.  Eyes:     Conjunctiva/sclera: Conjunctivae normal.  Cardiovascular:     Rate and Rhythm: Normal rate and regular rhythm.     Heart sounds: No murmur heard.    Arteriovenous access: Left arteriovenous access is present.    Comments: Left forearm AV fistula has intact thrill.  No active bleeding, no  overlying cellulitis Pulmonary:     Effort: Pulmonary effort is normal. No respiratory distress.     Breath sounds: Normal breath sounds.  Abdominal:     Palpations: Abdomen is soft.     Tenderness: There is no abdominal tenderness.  Musculoskeletal:        General: No swelling.     Cervical back: Neck supple.  Skin:    General: Skin is warm and dry.     Capillary Refill: Capillary refill takes less than 2 seconds.  Neurological:     General: No focal deficit present.     Mental Status: She is alert and oriented to person, place, and time.  Psychiatric:        Mood and Affect: Mood normal.     ED Results / Procedures / Treatments   Labs (all labs ordered are listed, but only abnormal results are displayed) Labs Reviewed - No data to display  EKG None  Radiology No results found.  Procedures Procedures    Medications Ordered in ED Medications - No data to display  ED Course/ Medical Decision Making/ A&P                                 Medical Decision Making Today's includes but not limited to AV fistula bleeding, laceration, pseudoaneurysm, other  Course: Patient reported bleeding described as spurting from her AV fistula after taking her bandage off about 6 hours after dialysis today.  This has resolved with the pressure bandage her husband had put on.  I removed the bandage, there is no active bleeding, I did replace bandaging including a hemostatic gauze, discussed with patient taking care not to disturb the area, take the bandage off tomorrow tonight or tomorrow, can loosen it if needed, come back for any recurrent bleeding.  I did consider obtaining labs the patient denies dizziness, denies a large amount of blood loss, and she has normal vital signs, she would also would prefer to forego labs and I feel this is reasonable given her stable exam and vitals.             Final Clinical Impression(s) / ED Diagnoses Final diagnoses:  Other complication of  arteriovenous dialysis fistula, initial encounter [facility])    Rx / DC Orders ED Discharge Orders     None         Joshua Nieves 11/23/23 1608    Cheyenne Cotta, MD 11/24/23 1140

## 2023-11-23 NOTE — Discharge Instructions (Addendum)
 You were seen in the ER for bleeding from your fistula site today after dialysis.  Fortunately it had stopped with the dressing that your husband had applied prior to arrival.  I did put hemostatic gauze and a new dressing on it.  You can keep this on until tomorrow, please take it off if it feels too tight, come back if you have recurrent bleeding or other worrisome changes.

## 2023-11-26 DIAGNOSIS — E1129 Type 2 diabetes mellitus with other diabetic kidney complication: Secondary | ICD-10-CM | POA: Diagnosis not present

## 2023-11-26 DIAGNOSIS — Z992 Dependence on renal dialysis: Secondary | ICD-10-CM | POA: Diagnosis not present

## 2023-11-26 DIAGNOSIS — D631 Anemia in chronic kidney disease: Secondary | ICD-10-CM | POA: Diagnosis not present

## 2023-11-26 DIAGNOSIS — N2581 Secondary hyperparathyroidism of renal origin: Secondary | ICD-10-CM | POA: Diagnosis not present

## 2023-11-26 DIAGNOSIS — N186 End stage renal disease: Secondary | ICD-10-CM | POA: Diagnosis not present

## 2023-11-26 DIAGNOSIS — E119 Type 2 diabetes mellitus without complications: Secondary | ICD-10-CM | POA: Diagnosis not present

## 2023-11-28 DIAGNOSIS — N186 End stage renal disease: Secondary | ICD-10-CM | POA: Diagnosis not present

## 2023-11-28 DIAGNOSIS — N2581 Secondary hyperparathyroidism of renal origin: Secondary | ICD-10-CM | POA: Diagnosis not present

## 2023-11-28 DIAGNOSIS — D631 Anemia in chronic kidney disease: Secondary | ICD-10-CM | POA: Diagnosis not present

## 2023-11-28 DIAGNOSIS — E119 Type 2 diabetes mellitus without complications: Secondary | ICD-10-CM | POA: Diagnosis not present

## 2023-11-28 DIAGNOSIS — E1129 Type 2 diabetes mellitus with other diabetic kidney complication: Secondary | ICD-10-CM | POA: Diagnosis not present

## 2023-11-28 DIAGNOSIS — Z992 Dependence on renal dialysis: Secondary | ICD-10-CM | POA: Diagnosis not present

## 2023-11-29 ENCOUNTER — Ambulatory Visit (HOSPITAL_COMMUNITY)
Admission: RE | Admit: 2023-11-29 | Discharge: 2023-11-29 | Disposition: A | Discharge status: Home / Self Care | Attending: General Surgery | Admitting: General Surgery

## 2023-11-29 ENCOUNTER — Ambulatory Visit: Payer: Medicare Other | Admitting: Gastroenterology

## 2023-11-29 ENCOUNTER — Other Ambulatory Visit: Payer: Self-pay | Admitting: Family Medicine

## 2023-11-29 ENCOUNTER — Encounter (HOSPITAL_COMMUNITY)
Admission: RE | Disposition: A | Discharge status: Home / Self Care | Payer: Self-pay | Source: Home / Self Care | Attending: General Surgery

## 2023-11-29 DIAGNOSIS — Z853 Personal history of malignant neoplasm of breast: Secondary | ICD-10-CM | POA: Insufficient documentation

## 2023-11-29 DIAGNOSIS — Z452 Encounter for adjustment and management of vascular access device: Secondary | ICD-10-CM | POA: Diagnosis not present

## 2023-11-29 DIAGNOSIS — Z9221 Personal history of antineoplastic chemotherapy: Secondary | ICD-10-CM | POA: Diagnosis not present

## 2023-11-29 HISTORY — PX: PORT-A-CATH REMOVAL: SHX5289

## 2023-11-29 SURGERY — REMOVAL PORT-A-CATH
Anesthesia: LOCAL | Site: Chest | Laterality: Right

## 2023-11-29 MED ORDER — LIDOCAINE HCL (PF) 1 % IJ SOLN
INTRAMUSCULAR | Status: AC
  Filled 2023-11-29: qty 30

## 2023-11-29 MED ORDER — LIDOCAINE HCL (PF) 1 % IJ SOLN
INTRAMUSCULAR | Status: DC | PRN
  Administered 2023-11-29: 6 mL

## 2023-11-29 MED ORDER — OXYCODONE HCL 5 MG PO TABS: 5.0000 mg | ORAL_TABLET | ORAL | 0 refills | Status: DC | PRN

## 2023-11-29 SURGICAL SUPPLY — 12 items
APPLICATOR CHLORAPREP 10.5 ORG (MISCELLANEOUS) ×1 IMPLANT
CLOTH BEACON ORANGE TIMEOUT ST (SAFETY) ×1 IMPLANT
DECANTER SPIKE VIAL GLASS SM (MISCELLANEOUS) ×1 IMPLANT
DERMABOND ADVANCED .7 DNX12 (GAUZE/BANDAGES/DRESSINGS) ×1 IMPLANT
DRAPE HALF SHEET 40X57 (DRAPES) ×1 IMPLANT
ELECT REM PT RETURN 9FT ADLT (ELECTROSURGICAL) IMPLANT
ELECTRODE REM PT RTRN 9FT ADLT (ELECTROSURGICAL) ×1 IMPLANT
GLOVE BIOGEL PI IND STRL 7.0 (GLOVE) ×2 IMPLANT
GLOVE SURG SS PI 7.5 STRL IVOR (GLOVE) ×2 IMPLANT
GOWN STRL REUS W/TWL LRG LVL3 (GOWN DISPOSABLE) ×2 IMPLANT
SUT MNCRL AB 4-0 PS2 18 (SUTURE) ×1 IMPLANT
SUT VIC AB 3-0 SH 27X BRD (SUTURE) ×1 IMPLANT

## 2023-11-29 NOTE — Op Note (Signed)
 Patient:  Megan [name]  DOB:  Mar 09, 1961  MRN:  [id number]   Preop Diagnosis: History of right breast cancer, finished with chemotherapy  Postop Diagnosis: Same  Procedure: Port-A-Cath removal  Surgeon: [name], MD  Anes: Local  Indications: Patient is a 62 year old black female who presents for Port-A-Cath removal as she is finished with chemotherapy for breast cancer.  The risks and benefits of the procedure including bleeding and infection were fully explained to the patient, who gave informed consent.  Procedure note: The patient was placed in the supine position.  After surgical site confirmation was performed, the right upper chest was prepped and draped using the usual sterile technique with ChloraPrep.  1% Xylocaine was used for local anesthesia.  An incision was made through the previous surgical incision site below the right clavicle.  The Port-A-Cath was removed in total without difficulty.  It was disposed of.  The subcutaneous layer was reapproximated using a 3-0 Vicryl interrupted suture.  The skin was closed using a 4-0 Monocryl subcuticular suture.  Dermabond was applied.  All tape and needle counts were correct at the end of the procedure.  The patient was discharged from the PACU in stable condition.  She tolerated the procedure well.  Complications: None  EBL: Minimal  Specimen: None

## 2023-11-29 NOTE — Interval H&P Note (Signed)
 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  The various methods of treatment have been discussed with the patient and family. After consideration of risks, benefits and other options for treatment, the patient has consented to  Procedure(s) with comments: REMOVAL PORT-A-CATH (N/A) - MINOR PROCEDURE ROOM as a surgical intervention.  The patient's history has been reviewed, patient examined, no change in status, stable for surgery.  I have reviewed the patient's chart and labs.  Questions were answered to the patient's satisfaction.     [name]

## 2023-11-30 DIAGNOSIS — N186 End stage renal disease: Secondary | ICD-10-CM | POA: Diagnosis not present

## 2023-11-30 DIAGNOSIS — E1129 Type 2 diabetes mellitus with other diabetic kidney complication: Secondary | ICD-10-CM | POA: Diagnosis not present

## 2023-11-30 DIAGNOSIS — Z992 Dependence on renal dialysis: Secondary | ICD-10-CM | POA: Diagnosis not present

## 2023-11-30 DIAGNOSIS — D631 Anemia in chronic kidney disease: Secondary | ICD-10-CM | POA: Diagnosis not present

## 2023-11-30 DIAGNOSIS — E119 Type 2 diabetes mellitus without complications: Secondary | ICD-10-CM | POA: Diagnosis not present

## 2023-11-30 DIAGNOSIS — N2581 Secondary hyperparathyroidism of renal origin: Secondary | ICD-10-CM | POA: Diagnosis not present

## 2023-11-30 NOTE — Telephone Encounter (Signed)
 Requested Prescriptions  Pending Prescriptions Disp Refills   fluticasone  (FLONASE ) 50 MCG/ACT nasal spray [Pharmacy Med Name: FLUTICASONE  NASAL SP (120) RX] 16 g 0    Sig: SHAKE LIQUID AND USE 2 SPRAYS IN EACH NOSTRIL DAILY     Ear, Nose, and Throat: Nasal Preparations - Corticosteroids Passed - 11/30/2023  9:16 AM      Passed - Valid encounter within last 12 months    Recent Outpatient Visits           4 weeks ago Atypical chest pain   [place] [facility] Duanne Butler DASEN, MD   1 month ago Gastroesophageal reflux disease without esophagitis   [place] [facility] Duanne Butler DASEN, MD   5 months ago Elevated blood sugar   [place] [facility] Duanne, Butler DASEN, MD   1 year ago Chest pressure   [place] [facility] Kayla Jeoffrey RAMAN, FNP   1 year ago Neuropathic pain    [facility] Pickard, Butler DASEN, MD       Future Appointments             In 2 months Rogers Hai, MD [facility] Of [facility]. [facility]

## 2023-12-03 ENCOUNTER — Encounter (HOSPITAL_COMMUNITY): Payer: Self-pay | Admitting: General Surgery

## 2023-12-03 DIAGNOSIS — Z992 Dependence on renal dialysis: Secondary | ICD-10-CM | POA: Diagnosis not present

## 2023-12-03 DIAGNOSIS — E1129 Type 2 diabetes mellitus with other diabetic kidney complication: Secondary | ICD-10-CM | POA: Diagnosis not present

## 2023-12-03 DIAGNOSIS — D631 Anemia in chronic kidney disease: Secondary | ICD-10-CM | POA: Diagnosis not present

## 2023-12-03 DIAGNOSIS — N186 End stage renal disease: Secondary | ICD-10-CM | POA: Diagnosis not present

## 2023-12-03 DIAGNOSIS — N2581 Secondary hyperparathyroidism of renal origin: Secondary | ICD-10-CM | POA: Diagnosis not present

## 2023-12-03 DIAGNOSIS — E119 Type 2 diabetes mellitus without complications: Secondary | ICD-10-CM | POA: Diagnosis not present

## 2023-12-05 DIAGNOSIS — E1129 Type 2 diabetes mellitus with other diabetic kidney complication: Secondary | ICD-10-CM | POA: Diagnosis not present

## 2023-12-05 DIAGNOSIS — E119 Type 2 diabetes mellitus without complications: Secondary | ICD-10-CM | POA: Diagnosis not present

## 2023-12-05 DIAGNOSIS — Z992 Dependence on renal dialysis: Secondary | ICD-10-CM | POA: Diagnosis not present

## 2023-12-05 DIAGNOSIS — N2581 Secondary hyperparathyroidism of renal origin: Secondary | ICD-10-CM | POA: Diagnosis not present

## 2023-12-05 DIAGNOSIS — N186 End stage renal disease: Secondary | ICD-10-CM | POA: Diagnosis not present

## 2023-12-05 DIAGNOSIS — D631 Anemia in chronic kidney disease: Secondary | ICD-10-CM | POA: Diagnosis not present

## 2023-12-06 ENCOUNTER — Encounter: Payer: Self-pay | Admitting: Gastroenterology

## 2023-12-06 ENCOUNTER — Ambulatory Visit (INDEPENDENT_AMBULATORY_CARE_PROVIDER_SITE_OTHER): Admitting: Gastroenterology

## 2023-12-06 VITALS — BP 109/68 | HR 87 | Temp 98.0°F | Ht 61.5 in | Wt 177.7 lb

## 2023-12-06 DIAGNOSIS — Z800 Family history of malignant neoplasm of digestive organs: Secondary | ICD-10-CM | POA: Diagnosis not present

## 2023-12-06 DIAGNOSIS — D649 Anemia, unspecified: Secondary | ICD-10-CM

## 2023-12-06 DIAGNOSIS — K642 Third degree hemorrhoids: Secondary | ICD-10-CM

## 2023-12-06 DIAGNOSIS — K219 Gastro-esophageal reflux disease without esophagitis: Secondary | ICD-10-CM

## 2023-12-06 DIAGNOSIS — K625 Hemorrhage of anus and rectum: Secondary | ICD-10-CM | POA: Diagnosis not present

## 2023-12-06 NOTE — Patient Instructions (Addendum)
 We will see you back in mid June!  If you have recurrent bleeding, let me know. If it persists, we need to consider a colonoscopy.  Congratulations on finishing treatment!  I enjoyed seeing you again today! I value our relationship and want to provide genuine, compassionate, and quality care. You may receive a survey regarding your visit with me, and I welcome your feedback! Thanks so much for taking the time to complete this. I look forward to seeing you again.      Delman Ferns, PhD, ANP-BC [facility]

## 2023-12-06 NOTE — Progress Notes (Signed)
 Gastroenterology Office Note     Primary Care Physician:  Austine Lefort, MD  Primary Gastroenterologist: Dr. Riley Cheadle    Chief Complaint   Chief Complaint  Patient presents with   Hemorrhoids    Follow up on hemorrhoids and rectal bleeding. States doing better. Has not seen any bleeding in about 9 days. Still has some itching. Not using anything currently since getting better.      History of Present Illness   Megan [name] is a 63 y.o. female presenting today with a history of  internal hemorrhoids s/p banding in 2021 and April/May 2023, Cdiff in July 2022, anemia, HFpEF, recurrent breast cancer, GERD, HTN, ESRD on dialysis, CAD, HLD, and diabetes, recently with new onset dyspepsia s/p EGD as noted below. She has also had issues with persistent rectal bleeding in setting of Grade 3 hemorrhoids.   Anemia in setting of chronic blood loss and chronic disease: had to receive 1 unit PRBCs in March 2025 due to acute on chronic anemia. No longer receiving heparin  at dialysis.   No bleeding since last Tuesday and prior to this hadn't had any. Took cream for awhile then calmed down. Bleeding has been rare now. She still has prolapsing tissue at times. She wants to hold off on banding right now.   EGD Feb 2025 with nodular mucosa but negative for malignancy. However, PET scan Oct 2024 with intense metabolic activity in gastric antrum felt inflammatory at that time. She has an upcoming EUS in May.   No constipation, straining, rectal pain. Hgb 8.7 recently, which is near her baseline. She tells me her brother was diagnosed with colon cancer in his 85s last year. We discussed low threshold for colonoscopy as last was in 2022. Reviewed imaging CT chest/abd/pelvis with contrast from 3/26 without acute or concerning findings. Suspect recent rectal bleeding due to known significant hemorrhoids.    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 Path with reactive gastritis, no malignancy.    Colonoscopy Nov 2022: non-bleeding internal hemorrhoids  EGD Nov 2022: normal esophagus, small hiatal hernia, one gastric polyp s/p removal, normal duodenum. Path with hyperplastic polyp.    FH colon cancer: brother, diagnosed at 6 last year 2024.    Past Medical History:  Diagnosis Date   (HFpEF) heart failure with preserved ejection fraction (HCC)    a. 01/2019 Echo: EF 55-60%, mild conc LVH. DD.  Torn MV chordae.   Anemia    Atypical chest pain    a. 08/2018 MV: EF 59%, no ischemia; b. 02/2019 Cath: nonobs dzs.   Blood transfusion without reported diagnosis    Breast cancer (HCC) 10/12/2020   Cataract    ESRD (end stage renal disease) on dialysis [facility])    a. HD T, T, S   Essential hypertension, benign    GERD (gastroesophageal reflux disease)    Headache    Hemorrhoids    Mixed hyperlipidemia    Morbid obesity (HCC)    Non-obstructive CAD (coronary artery disease)    a. 02/2019 CathL LM nl, LAD 65m, LCX nl, RCA 25p, EF 55-65%.   PONV (postoperative nausea and vomiting)    Renal insufficiency    S/P colonoscopy 08/2009   Dr. Nickey Barn: sessile polyp (benign lymphoid), large hemorrhoids, repeat 5-10 years   Temporal arteritis (HCC)    Type 2 diabetes mellitus (HCC)    Wears glasses     Past Surgical  History:  Procedure Laterality Date   ABDOMINAL HYSTERECTOMY     APPENDECTOMY     ARTERY BIOPSY N/A 05/09/2018   Procedure: RIGHT TEMPORAL ARTERY BIOPSY;  Surgeon: Jerryl Morin, MD;  Location: [facility] OR;  Service: General;  Laterality: N/A;   BIOPSY  10/04/2023   Procedure: BIOPSY;  Surgeon: Suzette Espy, MD;  Location: [facility] SU[facility];  Service: Endoscopy;;   BREAST BIOPSY Right 06/15/2020   Procedure: RIGHT BREAST BIOPSY;  Surgeon: [name], MD;  Location: [facility] ORS;  Service: General;  Laterality: Right;   CATARACT EXTRACTION W/PHACO Left 02/09/2017   Procedure: CATARACT  EXTRACTION PHACO AND INTRAOCULAR LENS PLACEMENT LEFT EYE;  Surgeon: Anner Kill, MD;  Location: [facility] ORS;  Service: Ophthalmology;  Laterality: Left;  CDE: 4.89   CATARACT EXTRACTION W/PHACO Right 06/04/2017   Procedure: CATARACT EXTRACTION PHACO AND INTRAOCULAR LENS PLACEMENT (IOC);  Surgeon: Anner Kill, MD;  Location: [facility] OR[facility];  Service: Ophthalmology;  Laterality: Right;  CDE: 4.12   CHOLECYSTECTOMY  09/29/2011   Procedure: LAPAROSCOPIC CHOLECYSTECTOMY;  Surgeon: Beau Bound, MD;  Location: [facility] OR[facility];  Service: General;  Laterality: N/A;   COLONOSCOPY  08/2009   Dr. Nickey Barn: sessile polyp (benign lymphoid), large hemorrhoids, repeat 5-10 years   COLONOSCOPY N/A 06/12/2016   prominent hemorrhoids   COLONOSCOPY WITH PROPOFOL  N/A 06/16/2021   Procedure: COLONOSCOPY WITH PROPOFOL ;  Surgeon: Suzette Espy, MD;  Location: [facility];  Service: Endoscopy;  Laterality: N/A;  9:30am (dialysis pt)   ESOPHAGOGASTRODUODENOSCOPY  09/05/2011   NWG:NFAOZ hiatal hernia; remainder of exam normal. No explanation for patient's abdominal pain with today's examination   ESOPHAGOGASTRODUODENOSCOPY N/A 12/17/2013   Dr. Riley Cheadle: gastric erythema, erosion, mild chronic inflammation on path    ESOPHAGOGASTRODUODENOSCOPY (EGD) WITH PROPOFOL  N/A 06/16/2021   Procedure: ESOPHAGOGASTRODUODENOSCOPY (EGD) WITH PROPOFOL ;  Surgeon: Suzette Espy, MD;  [facility];  Service: Endoscopy;  Laterality: N/A;   ESOPHAGOGASTRODUODENOSCOPY (EGD) WITH PROPOFOL  N/A 10/04/2023   Procedure: ESOPHAGOGASTRODUODENOSCOPY (EGD) WITH PROPOFOL ;  Surgeon: Suzette Espy, MD;  [facility]: [facility];  Service: Endoscopy;  Laterality: N/A;  200pm, ok rm 1-2, pt knows to arrive at 6:45   EXCISION OF BREAST BIOPSY Right 10/12/2020   Procedure: EXCISION OF RIGHT BREAST BIOPSY;  Surgeon: [name], MD;  Location: [facility];  Service: General;  Laterality: Right;   IR DIALY SHUNT INTRO NEEDLE/INTRACATH INITIAL W/IMG LEFT Left 07/17/2023    LAPAROSCOPIC APPENDECTOMY  09/29/2011   Procedure: APPENDECTOMY LAPAROSCOPIC;  Surgeon: Beau Bound, MD;  Location: [facility];  Service: General;;  incidental appendectomy   LEFT HEART CATH AND CORONARY ANGIOGRAPHY N/A 02/28/2019   Procedure: LEFT HEART CATH AND CORONARY ANGIOGRAPHY;  Surgeon: Lucendia Rusk, MD;  Location: [facility] INVASIVE CV LAB;  Service: Cardiovascular;  Laterality: N/A;   MASS EXCISION Right 01/18/2023   Procedure: EXCISION MASS, RIGHT CHEST S/P MASTECTOMY;  Surgeon: [name], MD;  Location: [facility];  Service: General;  Laterality: Right;   MASTECTOMY MODIFIED RADICAL Right 02/18/2020   Procedure: MASTECTOMY MODIFIED RADICAL;  Surgeon: [name], MD;  Location: [facility];  Service: General;  Laterality: Right;   MASTECTOMY, PARTIAL Right 07/13/2020   Procedure: RIGHT PARTIAL MASTECTOMY;  Surgeon: [name], MD;  Location: [facility];  Service: General;  Laterality: Right;   PARTIAL MASTECTOMY WITH NEEDLE LOCALIZATION AND AXILLARY SENTINEL LYMPH NODE BX Right 09/18/2018   Procedure: RIGHT PARTIAL MASTECTOMY AFTER NEEDLE LOCALIZATION, SENTINEL LYMPH NODE BIOPSY RIGHT AXILLA;  Surgeon: [name], MD;  Location: [facility];  Service: General;  Laterality: Right;   POLYPECTOMY  06/16/2021   Procedure: POLYPECTOMY;  Surgeon: Suzette Espy, MD;  [facility];  Service: Endoscopy;;   PORT-A-CATH REMOVAL Right 11/29/2023   Procedure: REMOVAL PORT-A-CATH;  Surgeon: [name], MD;  Location: [facility];  Service: General;  Laterality: Right;  MINOR PROCEDURE ROOM   PORTACATH PLACEMENT Right 06/07/2023   Procedure: INSERTION PORT-A-CATH, RIGHT  (DIALYSIS ACCESS ON LEFT);  Surgeon: [name], MD;  Location: [facility];  Service: General;  Laterality: Right;   SIMPLE MASTECTOMY WITH AXILLARY SENTINEL NODE BIOPSY Left 06/15/2020   Procedure: LEFT SIMPLE MASTECTOMY;  Surgeon: [name], MD;  Location: [facility];  Service: General;  Laterality: Left;    Current Outpatient  Medications  Medication Sig Dispense Refill   amLODipine  (NORVASC ) 10 MG tablet TAKE 1 TABLET BY MOUTH AT BEDTIME 90 tablet 3   aspirin  EC 81 MG tablet Take 81 mg by mouth daily.     atorvastatin  (LIPITOR) 20 MG tablet TAKE 1 TABLET(20 MG) BY MOUTH DAILY 90 tablet 0   B Complex-C-Zn-Folic Acid  (DIALYVITE  800-ZINC  15) 0.8 MG TABS Take 1 tablet by mouth daily.     cinacalcet  (SENSIPAR ) 60 MG tablet Take 60 mg by mouth daily.     Darbepoetin Alfa  (ARANESP , ALBUMIN FREE, IJ) Darbepoetin Alfa  (Aranesp )     fluticasone  (FLONASE ) 50 MCG/ACT nasal spray SHAKE LIQUID AND USE 2 SPRAYS IN EACH NOSTRIL DAILY 16 g 0   gabapentin  (NEURONTIN ) 300 MG capsule TAKE 1 CAPSULE(300 MG) BY MOUTH AT BEDTIME 90 capsule 3   iron  sucrose (VENOFER ) 20 MG/ML injection Inject 50 mg into the vein once a week.     lanthanum  (FOSRENOL ) 1000 MG chewable tablet Chew 2,000-3,000 mg by mouth See admin instructions. Take 3 tablets (3000 mg) by mouth with meals and take 2 tablets (2000 mg) with snacks     lidocaine -prilocaine  (EMLA ) cream APPLY TOPICALLY TO THE AFFECTED AREA 1 TIME 30 g 3   meclizine  (ANTIVERT ) 25 MG tablet Take 1 tablet (25 mg total) by mouth 3 (three) times daily as needed for dizziness. 30 tablet 1   nebivolol  (BYSTOLIC ) 10 MG tablet TAKE 1 TABLET BY MOUTH IN THE MORNING AND AT BEDTIME 180 tablet 0   nitroGLYCERIN  (NITROSTAT ) 0.4 MG SL tablet Place 1 tablet (0.4 mg total) under the tongue every 5 (five) minutes x 3 doses as needed for chest pain (if no relief after 3rd dose, proceed to ED or call 911). 25 tablet 3   oxyCODONE  (ROXICODONE ) 5 MG immediate release tablet Take 1 tablet (5 mg total) by mouth every 4 (four) hours as needed. 20 tablet 0   pantoprazole  (PROTONIX ) 40 MG tablet Take 1 tablet (40 mg total) by mouth 2 (two) times daily before a meal. 60 tablet 11   famotidine  (PEPCID ) 40 MG tablet Take 1 tablet (40 mg total) by mouth daily. (Patient not taking: Reported on 11/22/2023) 30 tablet 3   sucralfate   (CARAFATE ) 1 g tablet Take 1 tablet (1 g total) by mouth 4 (four) times daily -  before meals and at bedtime. Crush in small amount of water  and mix. (Patient not taking: Reported on 12/06/2023) 120 tablet 1   No current facility-administered medications for this visit.    Allergies as of 12/06/2023 - Review Complete 12/06/2023  Allergen Reaction Noted   Motrin [ibuprofen] Other (See Comments) 06/25/2023    Family History  Problem Relation Age of Onset   Hypertension  Mother    Coronary artery disease Mother    Diabetes Mother    Hypertension Sister    Coronary artery disease Sister    Hypertension Brother    Heart attack Father    Hypertension Son    Heart attack Maternal Aunt    Hypertension Maternal Aunt    Diabetes Maternal Aunt    Heart attack Maternal Uncle    Hypertension Maternal Uncle    Diabetes Maternal Uncle    Heart attack Paternal Aunt    Hypertension Paternal Aunt    Diabetes Paternal Aunt    Heart attack Paternal Uncle    Hypertension Paternal Uncle    Diabetes Paternal Uncle    Heart attack Maternal Grandmother    Heart attack Maternal Grandfather    Heart attack Paternal Grandmother    Heart attack Paternal Grandfather    Colon cancer Neg Hx     Social History   Socioeconomic History   Marital status: Married    Spouse name: Not on file   Number of children: Not on file   Years of education: Not on file   Highest education level: 12th grade  Occupational History   Occupation: Systems developer: [employer]  Tobacco Use   Smoking status: Never    Passive exposure: Never   Smokeless tobacco: Never  Vaping Use   Vaping status: Never Used  Substance and Sexual Activity   Alcohol use: No   Drug use: No   Sexual activity: Yes    Birth control/protection: Surgical  Other Topics Concern   Not on file  Social History Narrative   Works at [employer] in [place].    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   Also has to get  items from high shelves-causes achy pain in shoulder area      Married.   Children are grown, out of house.   Social Drivers of Corporate investment banker Strain: Low Risk  (09/20/2023)   Overall Financial Resource Strain (CARDIA)    Difficulty of Paying Living Expenses: Not hard at all  Food Insecurity: No Food Insecurity (09/20/2023)   Hunger Vital Sign    Worried About Running Out of Food in the Last Year: Never true    Ran Out of Food in the Last Year: Never true  Transportation Needs: No Transportation Needs (09/20/2023)   PRAPARE - Administrator, Civil Service (Medical): No    Lack of Transportation (Non-Medical): No  Physical Activity: Inactive (09/20/2023)   Exercise Vital Sign    Days of Exercise per Week: 0 days    Minutes of Exercise per Session: 0 min  Stress: No Stress Concern Present (09/20/2023)   [employer] of Occupational Health - Occupational Stress Questionnaire    Feeling of Stress : Not at all  Social Connections: Moderately Integrated (09/20/2023)   Social Connection and Isolation Panel [NHANES]    Frequency of Communication with Friends and Family: More than three times a week    Frequency of Social Gatherings with Friends and Family: Three times a week    Attends Religious Services: More than 4 times per year    Active Member of Clubs or Organizations: No    Attends Banker Meetings: Never    Marital Status: Married  Catering manager Violence: Not At Risk (09/20/2023)   Humiliation, Afraid, Rape, and Kick questionnaire    Fear of Current or Ex-Partner: No  Emotionally Abused: No    Physically Abused: No    Sexually Abused: No     Review of Systems   Gen: Denies any fever, chills, fatigue, weight loss, lack of appetite.  CV: Denies chest pain, heart palpitations, peripheral edema, syncope.  Resp: Denies shortness of breath at rest or with exertion. Denies wheezing or cough.  GI: Denies dysphagia or odynophagia. Denies  jaundice, hematemesis, fecal incontinence. GU : Denies urinary burning, urinary frequency, urinary hesitancy MS: Denies joint pain, muscle weakness, cramps, or limitation of movement.  Derm: Denies rash, itching, dry skin Psych: Denies depression, anxiety, memory loss, and confusion Heme: Denies bruising, bleeding, and enlarged lymph nodes.   Physical Exam   BP 109/68   Pulse 87   Temp 98 F (36.7 C)   Ht 5' 1.5" (1.562 m)   Wt 177 lb 11.2 oz (80.6 kg)   BMI 33.03 kg/m  General:   Alert and oriented. Pleasant and cooperative. Well-nourished and well-developed.  Head:  Normocephalic and atraumatic. Eyes:  Without icterus Abdomen:  +BS, soft, non-tender and non-distended. No HSM noted. No guarding or rebound. No masses appreciated.  Rectal:  Deferred  Msk:  Symmetrical without gross deformities. Normal posture. Extremities:  Without edema. Neurologic:  Alert and  oriented x4;  grossly normal neurologically. Skin:  Intact without significant lesions or rashes. Psych:  Alert and cooperative. Normal mood and affect.   Assessment   Megan [name] is a 63 y.o. female presenting today with a history of  internal hemorrhoids s/p banding in 2021 and April/May 2023, Cdiff in July 2022, anemia, HFpEF, recurrent breast cancer, GERD, HTN, ESRD on dialysis, CAD, HLD, diabetes, dyspepsia s/p EGD with abnormal nodular mucosa concerning for neoplasia but negative biopsies, now with upcoming EUS in May for further evaluation, following up with history of rectal bleeding and anemia.  Rectal bleeding: known significant Grade 3 hemorrhoids and had declined banding at last visit. She would like to hold off on this now as bleeding has resolved and only symptom is prolapse. We discussed low threshold of colonoscopy as last was in 2022 and she notes FH colon cancer in brother (diagnosed in his 69s last year). Recent imaging reviewed without obvious concerning findings. Suspect heparin  during dialysis  exacerbated bleeding, which has now been discontinued and patient doing well with this She will call if she desires to pursue banding.  Anemia in setting of chronic blood loss and chronic disease: had to receive 1 unit PRBCs in March 2025 due to acute on chronic anemia. No longer receiving heparin  at dialysis with improvement in rectal bleeding. EGD feb 2025 on file and colonoscopy in 2022. Hgb at baseline again now.     PLAN   Keep upcoming EUS; appreciate Dr. Brice Campi doing this procedure for this nice patient Call if any recurrent rectal bleeding. Low threshold for early interval colonoscopy Can consider hemorrhoid banding in future although may have maximized benefit Return after EUS   Delman Ferns, PhD, ANP-BC [facility]

## 2023-12-07 DIAGNOSIS — N2581 Secondary hyperparathyroidism of renal origin: Secondary | ICD-10-CM | POA: Diagnosis not present

## 2023-12-07 DIAGNOSIS — N186 End stage renal disease: Secondary | ICD-10-CM | POA: Diagnosis not present

## 2023-12-07 DIAGNOSIS — E1129 Type 2 diabetes mellitus with other diabetic kidney complication: Secondary | ICD-10-CM | POA: Diagnosis not present

## 2023-12-07 DIAGNOSIS — E119 Type 2 diabetes mellitus without complications: Secondary | ICD-10-CM | POA: Diagnosis not present

## 2023-12-07 DIAGNOSIS — D631 Anemia in chronic kidney disease: Secondary | ICD-10-CM | POA: Diagnosis not present

## 2023-12-07 DIAGNOSIS — Z992 Dependence on renal dialysis: Secondary | ICD-10-CM | POA: Diagnosis not present

## 2023-12-10 DIAGNOSIS — N186 End stage renal disease: Secondary | ICD-10-CM | POA: Diagnosis not present

## 2023-12-10 DIAGNOSIS — Z992 Dependence on renal dialysis: Secondary | ICD-10-CM | POA: Diagnosis not present

## 2023-12-10 DIAGNOSIS — E119 Type 2 diabetes mellitus without complications: Secondary | ICD-10-CM | POA: Diagnosis not present

## 2023-12-10 DIAGNOSIS — N2581 Secondary hyperparathyroidism of renal origin: Secondary | ICD-10-CM | POA: Diagnosis not present

## 2023-12-10 DIAGNOSIS — D631 Anemia in chronic kidney disease: Secondary | ICD-10-CM | POA: Diagnosis not present

## 2023-12-10 DIAGNOSIS — E1129 Type 2 diabetes mellitus with other diabetic kidney complication: Secondary | ICD-10-CM | POA: Diagnosis not present

## 2023-12-12 DIAGNOSIS — E119 Type 2 diabetes mellitus without complications: Secondary | ICD-10-CM | POA: Diagnosis not present

## 2023-12-12 DIAGNOSIS — Z992 Dependence on renal dialysis: Secondary | ICD-10-CM | POA: Diagnosis not present

## 2023-12-12 DIAGNOSIS — N2581 Secondary hyperparathyroidism of renal origin: Secondary | ICD-10-CM | POA: Diagnosis not present

## 2023-12-12 DIAGNOSIS — E1129 Type 2 diabetes mellitus with other diabetic kidney complication: Secondary | ICD-10-CM | POA: Diagnosis not present

## 2023-12-12 DIAGNOSIS — D631 Anemia in chronic kidney disease: Secondary | ICD-10-CM | POA: Diagnosis not present

## 2023-12-12 DIAGNOSIS — N186 End stage renal disease: Secondary | ICD-10-CM | POA: Diagnosis not present

## 2023-12-13 DIAGNOSIS — N040 Nephrotic syndrome with minor glomerular abnormality: Secondary | ICD-10-CM | POA: Diagnosis not present

## 2023-12-13 DIAGNOSIS — N186 End stage renal disease: Secondary | ICD-10-CM | POA: Diagnosis not present

## 2023-12-13 DIAGNOSIS — Z992 Dependence on renal dialysis: Secondary | ICD-10-CM | POA: Diagnosis not present

## 2023-12-14 DIAGNOSIS — N2581 Secondary hyperparathyroidism of renal origin: Secondary | ICD-10-CM | POA: Diagnosis not present

## 2023-12-14 DIAGNOSIS — D631 Anemia in chronic kidney disease: Secondary | ICD-10-CM | POA: Diagnosis not present

## 2023-12-14 DIAGNOSIS — E119 Type 2 diabetes mellitus without complications: Secondary | ICD-10-CM | POA: Diagnosis not present

## 2023-12-14 DIAGNOSIS — D509 Iron deficiency anemia, unspecified: Secondary | ICD-10-CM | POA: Diagnosis not present

## 2023-12-14 DIAGNOSIS — N186 End stage renal disease: Secondary | ICD-10-CM | POA: Diagnosis not present

## 2023-12-14 DIAGNOSIS — E1129 Type 2 diabetes mellitus with other diabetic kidney complication: Secondary | ICD-10-CM | POA: Diagnosis not present

## 2023-12-14 DIAGNOSIS — Z992 Dependence on renal dialysis: Secondary | ICD-10-CM | POA: Diagnosis not present

## 2023-12-17 DIAGNOSIS — Z992 Dependence on renal dialysis: Secondary | ICD-10-CM | POA: Diagnosis not present

## 2023-12-17 DIAGNOSIS — D509 Iron deficiency anemia, unspecified: Secondary | ICD-10-CM | POA: Diagnosis not present

## 2023-12-17 DIAGNOSIS — N2581 Secondary hyperparathyroidism of renal origin: Secondary | ICD-10-CM | POA: Diagnosis not present

## 2023-12-17 DIAGNOSIS — N186 End stage renal disease: Secondary | ICD-10-CM | POA: Diagnosis not present

## 2023-12-17 DIAGNOSIS — E1129 Type 2 diabetes mellitus with other diabetic kidney complication: Secondary | ICD-10-CM | POA: Diagnosis not present

## 2023-12-17 DIAGNOSIS — D631 Anemia in chronic kidney disease: Secondary | ICD-10-CM | POA: Diagnosis not present

## 2023-12-19 DIAGNOSIS — N2581 Secondary hyperparathyroidism of renal origin: Secondary | ICD-10-CM | POA: Diagnosis not present

## 2023-12-19 DIAGNOSIS — D631 Anemia in chronic kidney disease: Secondary | ICD-10-CM | POA: Diagnosis not present

## 2023-12-19 DIAGNOSIS — Z992 Dependence on renal dialysis: Secondary | ICD-10-CM | POA: Diagnosis not present

## 2023-12-19 DIAGNOSIS — N186 End stage renal disease: Secondary | ICD-10-CM | POA: Diagnosis not present

## 2023-12-19 DIAGNOSIS — E1129 Type 2 diabetes mellitus with other diabetic kidney complication: Secondary | ICD-10-CM | POA: Diagnosis not present

## 2023-12-19 DIAGNOSIS — D509 Iron deficiency anemia, unspecified: Secondary | ICD-10-CM | POA: Diagnosis not present

## 2023-12-21 DIAGNOSIS — N186 End stage renal disease: Secondary | ICD-10-CM | POA: Diagnosis not present

## 2023-12-21 DIAGNOSIS — E1129 Type 2 diabetes mellitus with other diabetic kidney complication: Secondary | ICD-10-CM | POA: Diagnosis not present

## 2023-12-21 DIAGNOSIS — N2581 Secondary hyperparathyroidism of renal origin: Secondary | ICD-10-CM | POA: Diagnosis not present

## 2023-12-21 DIAGNOSIS — Z992 Dependence on renal dialysis: Secondary | ICD-10-CM | POA: Diagnosis not present

## 2023-12-21 DIAGNOSIS — D509 Iron deficiency anemia, unspecified: Secondary | ICD-10-CM | POA: Diagnosis not present

## 2023-12-21 DIAGNOSIS — D631 Anemia in chronic kidney disease: Secondary | ICD-10-CM | POA: Diagnosis not present

## 2023-12-24 ENCOUNTER — Encounter (HOSPITAL_COMMUNITY): Payer: Self-pay | Admitting: Gastroenterology

## 2023-12-24 DIAGNOSIS — Z992 Dependence on renal dialysis: Secondary | ICD-10-CM | POA: Diagnosis not present

## 2023-12-24 DIAGNOSIS — E1129 Type 2 diabetes mellitus with other diabetic kidney complication: Secondary | ICD-10-CM | POA: Diagnosis not present

## 2023-12-24 DIAGNOSIS — N2581 Secondary hyperparathyroidism of renal origin: Secondary | ICD-10-CM | POA: Diagnosis not present

## 2023-12-24 DIAGNOSIS — D631 Anemia in chronic kidney disease: Secondary | ICD-10-CM | POA: Diagnosis not present

## 2023-12-24 DIAGNOSIS — N186 End stage renal disease: Secondary | ICD-10-CM | POA: Diagnosis not present

## 2023-12-24 DIAGNOSIS — D509 Iron deficiency anemia, unspecified: Secondary | ICD-10-CM | POA: Diagnosis not present

## 2023-12-25 ENCOUNTER — Telehealth: Payer: Self-pay

## 2023-12-25 NOTE — Telephone Encounter (Signed)
 Procedure:ultrasound Procedure date: 12/31/23 Procedure location: wl Arrival Time: 72 Spoke with the patient Y/N: y Any prep concerns? n  Has the patient obtained the prep from the pharmacy ? n Do you have a care partner and transportation: y Any additional concerns? n

## 2023-12-26 DIAGNOSIS — D631 Anemia in chronic kidney disease: Secondary | ICD-10-CM | POA: Diagnosis not present

## 2023-12-26 DIAGNOSIS — N186 End stage renal disease: Secondary | ICD-10-CM | POA: Diagnosis not present

## 2023-12-26 DIAGNOSIS — D509 Iron deficiency anemia, unspecified: Secondary | ICD-10-CM | POA: Diagnosis not present

## 2023-12-26 DIAGNOSIS — N2581 Secondary hyperparathyroidism of renal origin: Secondary | ICD-10-CM | POA: Diagnosis not present

## 2023-12-26 DIAGNOSIS — E1129 Type 2 diabetes mellitus with other diabetic kidney complication: Secondary | ICD-10-CM | POA: Diagnosis not present

## 2023-12-26 DIAGNOSIS — Z992 Dependence on renal dialysis: Secondary | ICD-10-CM | POA: Diagnosis not present

## 2023-12-28 ENCOUNTER — Encounter (HOSPITAL_COMMUNITY): Payer: Self-pay | Admitting: Gastroenterology

## 2023-12-28 DIAGNOSIS — Z992 Dependence on renal dialysis: Secondary | ICD-10-CM | POA: Diagnosis not present

## 2023-12-28 DIAGNOSIS — N186 End stage renal disease: Secondary | ICD-10-CM | POA: Diagnosis not present

## 2023-12-28 DIAGNOSIS — D631 Anemia in chronic kidney disease: Secondary | ICD-10-CM | POA: Diagnosis not present

## 2023-12-28 DIAGNOSIS — N2581 Secondary hyperparathyroidism of renal origin: Secondary | ICD-10-CM | POA: Diagnosis not present

## 2023-12-28 DIAGNOSIS — D509 Iron deficiency anemia, unspecified: Secondary | ICD-10-CM | POA: Diagnosis not present

## 2023-12-28 DIAGNOSIS — E1129 Type 2 diabetes mellitus with other diabetic kidney complication: Secondary | ICD-10-CM | POA: Diagnosis not present

## 2023-12-31 ENCOUNTER — Encounter (HOSPITAL_COMMUNITY)
Admission: RE | Disposition: A | Discharge status: Home / Self Care | Payer: Self-pay | Source: Home / Self Care | Attending: Gastroenterology

## 2023-12-31 ENCOUNTER — Other Ambulatory Visit: Payer: Self-pay

## 2023-12-31 ENCOUNTER — Ambulatory Visit (HOSPITAL_COMMUNITY): Payer: Self-pay | Admitting: Anesthesiology

## 2023-12-31 ENCOUNTER — Ambulatory Visit (HOSPITAL_COMMUNITY)
Admission: RE | Admit: 2023-12-31 | Discharge: 2023-12-31 | Disposition: A | Discharge status: Home / Self Care | Attending: Gastroenterology | Admitting: Gastroenterology

## 2023-12-31 ENCOUNTER — Encounter (HOSPITAL_COMMUNITY): Payer: Self-pay | Admitting: Gastroenterology

## 2023-12-31 DIAGNOSIS — R9389 Abnormal findings on diagnostic imaging of other specified body structures: Secondary | ICD-10-CM

## 2023-12-31 DIAGNOSIS — K31811 Angiodysplasia of stomach and duodenum with bleeding: Secondary | ICD-10-CM | POA: Diagnosis not present

## 2023-12-31 DIAGNOSIS — K2289 Other specified disease of esophagus: Secondary | ICD-10-CM | POA: Insufficient documentation

## 2023-12-31 DIAGNOSIS — I5032 Chronic diastolic (congestive) heart failure: Secondary | ICD-10-CM | POA: Insufficient documentation

## 2023-12-31 DIAGNOSIS — K31819 Angiodysplasia of stomach and duodenum without bleeding: Secondary | ICD-10-CM

## 2023-12-31 DIAGNOSIS — N186 End stage renal disease: Secondary | ICD-10-CM | POA: Insufficient documentation

## 2023-12-31 DIAGNOSIS — I899 Noninfective disorder of lymphatic vessels and lymph nodes, unspecified: Secondary | ICD-10-CM | POA: Diagnosis not present

## 2023-12-31 DIAGNOSIS — Z992 Dependence on renal dialysis: Secondary | ICD-10-CM | POA: Insufficient documentation

## 2023-12-31 DIAGNOSIS — K2970 Gastritis, unspecified, without bleeding: Secondary | ICD-10-CM | POA: Diagnosis not present

## 2023-12-31 DIAGNOSIS — Z6833 Body mass index (BMI) 33.0-33.9, adult: Secondary | ICD-10-CM | POA: Diagnosis not present

## 2023-12-31 DIAGNOSIS — R933 Abnormal findings on diagnostic imaging of other parts of digestive tract: Secondary | ICD-10-CM | POA: Diagnosis not present

## 2023-12-31 DIAGNOSIS — K319 Disease of stomach and duodenum, unspecified: Secondary | ICD-10-CM

## 2023-12-31 DIAGNOSIS — I5031 Acute diastolic (congestive) heart failure: Secondary | ICD-10-CM

## 2023-12-31 DIAGNOSIS — K3189 Other diseases of stomach and duodenum: Secondary | ICD-10-CM | POA: Insufficient documentation

## 2023-12-31 DIAGNOSIS — E1122 Type 2 diabetes mellitus with diabetic chronic kidney disease: Secondary | ICD-10-CM | POA: Insufficient documentation

## 2023-12-31 DIAGNOSIS — K2961 Other gastritis with bleeding: Secondary | ICD-10-CM

## 2023-12-31 DIAGNOSIS — K449 Diaphragmatic hernia without obstruction or gangrene: Secondary | ICD-10-CM

## 2023-12-31 DIAGNOSIS — I132 Hypertensive heart and chronic kidney disease with heart failure and with stage 5 chronic kidney disease, or end stage renal disease: Secondary | ICD-10-CM

## 2023-12-31 DIAGNOSIS — D509 Iron deficiency anemia, unspecified: Secondary | ICD-10-CM | POA: Diagnosis not present

## 2023-12-31 DIAGNOSIS — K219 Gastro-esophageal reflux disease without esophagitis: Secondary | ICD-10-CM | POA: Insufficient documentation

## 2023-12-31 DIAGNOSIS — D649 Anemia, unspecified: Secondary | ICD-10-CM | POA: Diagnosis not present

## 2023-12-31 DIAGNOSIS — I2510 Atherosclerotic heart disease of native coronary artery without angina pectoris: Secondary | ICD-10-CM | POA: Diagnosis not present

## 2023-12-31 DIAGNOSIS — K2960 Other gastritis without bleeding: Secondary | ICD-10-CM | POA: Diagnosis not present

## 2023-12-31 HISTORY — PX: ESOPHAGOGASTRODUODENOSCOPY: SHX5428

## 2023-12-31 HISTORY — PX: EUS: SHX5427

## 2023-12-31 LAB — POCT I-STAT, CHEM 8
BUN: 52 mg/dL — ABNORMAL HIGH (ref 8–23)
Calcium, Ion: 0.99 mmol/L — ABNORMAL LOW (ref 1.15–1.40)
Chloride: 103 mmol/L (ref 98–111)
Creatinine, Ser: 14 mg/dL — ABNORMAL HIGH (ref 0.44–1.00)
Glucose, Bld: 99 mg/dL (ref 70–99)
HCT: 30 % — ABNORMAL LOW (ref 36.0–46.0)
Hemoglobin: 10.2 g/dL — ABNORMAL LOW (ref 12.0–15.0)
Potassium: 5.3 mmol/L — ABNORMAL HIGH (ref 3.5–5.1)
Sodium: 139 mmol/L (ref 135–145)
TCO2: 25 mmol/L (ref 22–32)

## 2023-12-31 SURGERY — ULTRASOUND, UPPER GI TRACT, ENDOSCOPIC
Anesthesia: Monitor Anesthesia Care

## 2023-12-31 MED ORDER — SUCRALFATE 1 G PO TABS: 1.0000 g | ORAL_TABLET | Freq: Two times a day (BID) | ORAL | 0 refills | Status: DC

## 2023-12-31 MED ORDER — ONDANSETRON HCL 4 MG/2ML IJ SOLN
INTRAMUSCULAR | Status: DC | PRN
  Administered 2023-12-31: 4 mg via INTRAVENOUS

## 2023-12-31 MED ORDER — PROPOFOL 500 MG/50ML IV EMUL
INTRAVENOUS | Status: DC | PRN
  Administered 2023-12-31: 20 mg via INTRAVENOUS
  Administered 2023-12-31: 60 mg via INTRAVENOUS
  Administered 2023-12-31: 125 ug/kg/min via INTRAVENOUS

## 2023-12-31 MED ORDER — LIDOCAINE HCL (PF) 2 % IJ SOLN
INTRAMUSCULAR | Status: DC | PRN
  Administered 2023-12-31: 80 mg via INTRADERMAL

## 2023-12-31 MED ORDER — DICYCLOMINE HCL 10 MG PO CAPS: 10.0000 mg | ORAL_CAPSULE | Freq: Two times a day (BID) | ORAL | 0 refills | Status: DC | PRN

## 2023-12-31 MED ORDER — SODIUM CHLORIDE 0.9 % IV SOLN: INTRAVENOUS | Status: DC

## 2023-12-31 MED ORDER — SODIUM CHLORIDE 0.9 % IV SOLN: INTRAVENOUS | Status: DC | PRN

## 2023-12-31 MED ORDER — FENTANYL CITRATE (PF) 100 MCG/2ML IJ SOLN
INTRAMUSCULAR | Status: DC | PRN
Start: 2023-12-31 — End: 2023-12-31
  Administered 2023-12-31: 25 ug via INTRAVENOUS

## 2023-12-31 MED ORDER — FENTANYL CITRATE (PF) 100 MCG/2ML IJ SOLN
INTRAMUSCULAR | Status: AC
Start: 2023-12-31 — End: ?
  Filled 2023-12-31: qty 2

## 2023-12-31 NOTE — Anesthesia Preprocedure Evaluation (Signed)
 Anesthesia Evaluation  Patient identified by MRN, date of birth, ID band Patient awake    Reviewed: Allergy & Precautions, H&P , NPO status , Patient's Chart, lab work & pertinent test results  History of Anesthesia Complications (+) PONV and history of anesthetic complications  Airway Mallampati: II   Neck ROM: full    Dental   Pulmonary shortness of breath   breath sounds clear to auscultation       Cardiovascular hypertension, + CAD and +CHF   Rhythm:regular Rate:Normal     Neuro/Psych  Headaches    GI/Hepatic ,GERD  ,,  Endo/Other  diabetes, Type 2Hypothyroidism    Renal/GU ESRF and DialysisRenal disease     Musculoskeletal   Abdominal   Peds  Hematology   Anesthesia Other Findings   Reproductive/Obstetrics                             Anesthesia Physical Anesthesia Plan  ASA: 4  Anesthesia Plan: MAC   Post-op Pain Management:    Induction: Intravenous  PONV Risk Score and Plan: 3 and Propofol  infusion and Treatment may vary due to age or medical condition  Airway Management Planned: Nasal Cannula  Additional Equipment:   Intra-op Plan:   Post-operative Plan:   Informed Consent: I have reviewed the patients History and Physical, chart, labs and discussed the procedure including the risks, benefits and alternatives for the proposed anesthesia with the patient or authorized representative who has indicated his/her understanding and acceptance.     Dental advisory given  Plan Discussed with: CRNA, Anesthesiologist and Surgeon  Anesthesia Plan Comments:        Anesthesia Quick Evaluation

## 2023-12-31 NOTE — Transfer of Care (Signed)
 Immediate Anesthesia Transfer of Care Note  Patient: Megan [name]  Procedure(s) Performed: ULTRASOUND, UPPER GI TRACT, ENDOSCOPIC EGD (ESOPHAGOGASTRODUODENOSCOPY)  Patient Location: PACU and Endoscopy Unit  Anesthesia Type:MAC  Level of Consciousness: sedated  Airway & Oxygen  Therapy: Patient Spontanous Breathing and Patient connected to face mask oxygen   Post-op Assessment: Report given to RN and Post -op Vital signs reviewed and stable  Post vital signs: Reviewed and stable  Last Vitals:  Vitals Value Taken Time  BP    Temp    Pulse    Resp    SpO2      Last Pain:  Vitals:   12/31/23 0957  TempSrc: Temporal  PainSc: 0-No pain         Complications: No notable events documented.

## 2023-12-31 NOTE — H&P (Signed)
 GASTROENTEROLOGY PROCEDURE H&P NOTE   Primary Care Physician: Austine Lefort, MD  HPI: Megan [name] is a 63 y.o. female  who presents for EGD/EUS to evaluate reported antral subepithelial lesion versus abnormal tissue.  Past Medical History:  Diagnosis Date   (HFpEF) heart failure with preserved ejection fraction (HCC)    a. 01/2019 Echo: EF 55-60%, mild conc LVH. DD.  Torn MV chordae.   Anemia    Atypical chest pain    a. 08/2018 MV: EF 59%, no ischemia; b. 02/2019 Cath: nonobs dzs.   Blood transfusion without reported diagnosis    Breast cancer (HCC) 10/12/2020   Cataract    ESRD (end stage renal disease) on dialysis [facility])    a. HD T, T, S   Essential hypertension, benign    GERD (gastroesophageal reflux disease)    Headache    Hemorrhoids    Mixed hyperlipidemia    Morbid obesity (HCC)    Non-obstructive CAD (coronary artery disease)    a. 02/2019 CathL LM nl, LAD 17m, LCX nl, RCA 25p, EF 55-65%.   PONV (postoperative nausea and vomiting)    Renal insufficiency    S/P colonoscopy 08/2009   Dr. Nickey Barn: sessile polyp (benign lymphoid), large hemorrhoids, repeat 5-10 years   Temporal arteritis (HCC)    Type 2 diabetes mellitus (HCC)    Wears glasses    Past Surgical History:  Procedure Laterality Date   ABDOMINAL HYSTERECTOMY     APPENDECTOMY     ARTERY BIOPSY N/A 05/09/2018   Procedure: RIGHT TEMPORAL ARTERY BIOPSY;  Surgeon: Jerryl Morin, MD;  Location: [facility] OR;  Service: General;  Laterality: N/A;   BIOPSY  10/04/2023   Procedure: BIOPSY;  Surgeon: Suzette Espy, MD;  [facility];  Service: Endoscopy;;   BREAST BIOPSY Right 06/15/2020   Procedure: RIGHT BREAST BIOPSY;  Surgeon: [name], MD;  Location: [facility] ORS;  Service: General;  Laterality: Right;   CATARACT EXTRACTION W/PHACO Left 02/09/2017   Procedure: CATARACT EXTRACTION PHACO AND INTRAOCULAR LENS PLACEMENT LEFT EYE;  Surgeon: Anner Kill, MD;  Location: [facility] ORS;  Service: Ophthalmology;   Laterality: Left;  CDE: 4.89   CATARACT EXTRACTION W/PHACO Right 06/04/2017   Procedure: CATARACT EXTRACTION PHACO AND INTRAOCULAR LENS PLACEMENT (IOC);  Surgeon: Anner Kill, MD;  Location: [facility];  Service: Ophthalmology;  Laterality: Right;  CDE: 4.12   CHOLECYSTECTOMY  09/29/2011   Procedure: LAPAROSCOPIC CHOLECYSTECTOMY;  Surgeon: Beau Bound, MD;  Location: [facility] OR[facility];  Service: General;  Laterality: N/A;   COLONOSCOPY  08/2009   Dr. Nickey Barn: sessile polyp (benign lymphoid), large hemorrhoids, repeat 5-10 years   COLONOSCOPY N/A 06/12/2016   prominent hemorrhoids   COLONOSCOPY WITH PROPOFOL  N/A 06/16/2021   Procedure: COLONOSCOPY WITH PROPOFOL ;  Surgeon: Suzette Espy, MD;  [facility];  Service: Endoscopy;  Laterality: N/A;  9:30am (dialysis pt)   ESOPHAGOGASTRODUODENOSCOPY  09/05/2011   ZOX:WRUEA hiatal hernia; remainder of exam normal. No explanation for patient's abdominal pain with today's examination   ESOPHAGOGASTRODUODENOSCOPY N/A 12/17/2013   Dr. Riley Cheadle: gastric erythema, erosion, mild chronic inflammation on path    ESOPHAGOGASTRODUODENOSCOPY (EGD) WITH PROPOFOL  N/A 06/16/2021   Procedure: ESOPHAGOGASTRODUODENOSCOPY (EGD) WITH PROPOFOL ;  Surgeon: Suzette Espy, MD;  [facility];  Service: Endoscopy;  Laterality: N/A;   ESOPHAGOGASTRODUODENOSCOPY (EGD) WITH PROPOFOL  N/A 10/04/2023   Procedure: ESOPHAGOGASTRODUODENOSCOPY (EGD) WITH PROPOFOL ;  Surgeon: Suzette Espy, MD;  [facility];  Service: Endoscopy;  Laterality: N/A;  200pm, ok rm 1-2, pt knows to arrive at 6:45   EXCISION OF BREAST BIOPSY Right 10/12/2020   Procedure: EXCISION OF RIGHT BREAST BIOPSY;  Surgeon: [name], MD;  Location: [facility];  Service: General;  Laterality: Right;   IR DIALY SHUNT INTRO NEEDLE/INTRACATH INITIAL W/IMG LEFT Left 07/17/2023   LAPAROSCOPIC APPENDECTOMY  09/29/2011   Procedure: APPENDECTOMY LAPAROSCOPIC;  Surgeon: Beau Bound, MD;  Location: [facility];   Service: General;;  incidental appendectomy   LEFT HEART CATH AND CORONARY ANGIOGRAPHY N/A 02/28/2019   Procedure: LEFT HEART CATH AND CORONARY ANGIOGRAPHY;  Surgeon: Lucendia Rusk, MD;  Location: [facility] INVASIVE CV LAB;  Service: Cardiovascular;  Laterality: N/A;   MASS EXCISION Right 01/18/2023   Procedure: EXCISION MASS, RIGHT CHEST S/P MASTECTOMY;  Surgeon: [name], MD;  Location: [facility];  Service: General;  Laterality: Right;   MASTECTOMY MODIFIED RADICAL Right 02/18/2020   Procedure: MASTECTOMY MODIFIED RADICAL;  Surgeon: [name], MD;  Location: [facility];  Service: General;  Laterality: Right;   MASTECTOMY, PARTIAL Right 07/13/2020   Procedure: RIGHT PARTIAL MASTECTOMY;  Surgeon: [name], MD;  Location: [facility];  Service: General;  Laterality: Right;   PARTIAL MASTECTOMY WITH NEEDLE LOCALIZATION AND AXILLARY SENTINEL LYMPH NODE BX Right 09/18/2018   Procedure: RIGHT PARTIAL MASTECTOMY AFTER NEEDLE LOCALIZATION, SENTINEL LYMPH NODE BIOPSY RIGHT AXILLA;  Surgeon: [name], MD;  Location: [facility];  Service: General;  Laterality: Right;   POLYPECTOMY  06/16/2021   Procedure: POLYPECTOMY;  Surgeon: Suzette Espy, MD;  [facility];  Service: Endoscopy;;   PORT-A-CATH REMOVAL Right 11/29/2023   Procedure: REMOVAL PORT-A-CATH;  Surgeon: [name], MD;  Location: [facility];  Service: General;  Laterality: Right;  MINOR PROCEDURE ROOM   PORTACATH PLACEMENT Right 06/07/2023   Procedure: INSERTION PORT-A-CATH, RIGHT  (DIALYSIS ACCESS ON LEFT);  Surgeon: [name], MD;  Location: [facility];  Service: General;  Laterality: Right;   SIMPLE MASTECTOMY WITH AXILLARY SENTINEL NODE BIOPSY Left 06/15/2020   Procedure: LEFT SIMPLE MASTECTOMY;  Surgeon: [name], MD;  Location: [facility];  Service: General;  Laterality: Left;   No current facility-administered medications for this encounter.   No current facility-administered medications for this encounter. Allergies   Allergen Reactions   Motrin [Ibuprofen] Other (See Comments)    ESRD    Family History  Problem Relation Age of Onset   Hypertension Mother    Coronary artery disease Mother    Diabetes Mother    Heart attack Father    Hypertension Sister    Coronary artery disease Sister    Hypertension Brother    Colon cancer Brother    Heart attack Maternal Grandmother    Heart attack Maternal Grandfather    Heart attack Paternal Grandmother    Heart attack Paternal Grandfather    Hypertension Son    Heart attack Maternal Aunt    Hypertension Maternal Aunt    Diabetes Maternal Aunt    Heart attack Maternal Uncle    Hypertension Maternal Uncle    Diabetes Maternal Uncle    Heart attack Paternal Aunt    Hypertension Paternal Aunt    Diabetes Paternal Aunt    Heart attack Paternal Uncle    Hypertension Paternal Uncle    Diabetes Paternal Uncle    Social History   Socioeconomic History   Marital status: Married    Spouse name: Not on file   Number of children: Not on file   Years of education: Not  on file   Highest education level: 12th grade  Occupational History   Occupation: Systems developer: [employer]  Tobacco Use   Smoking status: Never    Passive exposure: Never   Smokeless tobacco: Never  Vaping Use   Vaping status: Never Used  Substance and Sexual Activity   Alcohol use: No   Drug use: No   Sexual activity: Yes    Birth control/protection: Surgical  Other Topics Concern   Not on file  Social History Narrative   Works at [employer] in [place].    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   Also has to get items from high shelves-causes achy pain in shoulder area      Married.   Children are grown, out of house.   Social Drivers of Corporate investment banker Strain: Low Risk  (09/20/2023)   Overall Financial Resource Strain (CARDIA)    Difficulty of Paying Living Expenses: Not hard at all  Food Insecurity: No Food Insecurity (09/20/2023)    Hunger Vital Sign    Worried About Running Out of Food in the Last Year: Never true    Ran Out of Food in the Last Year: Never true  Transportation Needs: No Transportation Needs (09/20/2023)   PRAPARE - Administrator, Civil Service (Medical): No    Lack of Transportation (Non-Medical): No  Physical Activity: Inactive (09/20/2023)   Exercise Vital Sign    Days of Exercise per Week: 0 days    Minutes of Exercise per Session: 0 min  Stress: No Stress Concern Present (09/20/2023)   [employer] of Occupational Health - Occupational Stress Questionnaire    Feeling of Stress : Not at all  Social Connections: Moderately Integrated (09/20/2023)   Social Connection and Isolation Panel [NHANES]    Frequency of Communication with Friends and Family: More than three times a week    Frequency of Social Gatherings with Friends and Family: Three times a week    Attends Religious Services: More than 4 times per year    Active Member of Clubs or Organizations: No    Attends Banker Meetings: Never    Marital Status: Married  Catering manager Violence: Not At Risk (09/20/2023)   Humiliation, Afraid, Rape, and Kick questionnaire    Fear of Current or Ex-Partner: No    Emotionally Abused: No    Physically Abused: No    Sexually Abused: No    Physical Exam: Today's Vitals   12/31/23 0957  BP: (!) 162/71  Pulse: 75  Resp: 13  Temp: (!) 97 F (36.1 C)  TempSrc: Temporal  SpO2: 98%  Weight: 80.3 kg  Height: 5\' 1"  (1.549 m)  PainSc: 0-No pain   Body mass index is 33.44 kg/m. GEN: NAD EYE: Sclerae anicteric ENT: MMM CV: Non-tachycardic GI: Soft, NT/ND NEURO:  Alert & Oriented x 3  Lab Results: Recent Labs    12/31/23 1000  HGB 10.2*  HCT 30.0*   BMET Recent Labs    12/31/23 1000  NA 139  K 5.3*  CL 103  GLUCOSE 99  BUN 52*  CREATININE 14.00*   LFT No results for input(s): "PROT", "ALBUMIN", "AST", "ALT", "ALKPHOS", "BILITOT", "BILIDIR", "IBILI" in  the last 72 hours. PT/INR No results for input(s): "LABPROT", "INR" in the last 72 hours.   Impression / Plan: This is a 63 y.o.female who presents for EGD/EUS to evaluate reported antral subepithelial  lesion versus abnormal tissue.  The risks and benefits of endoscopic evaluation/treatment were discussed with the patient and/or family; these include but are not limited to the risk of perforation, infection, bleeding, missed lesions, lack of diagnosis, severe illness requiring hospitalization, as well as anesthesia and sedation related illnesses.  The patient's history has been reviewed, patient examined, no change in status, and deemed stable for procedure.  The patient and/or family is agreeable to proceed.    Yong Henle, MD [facility] Office # [phone]

## 2023-12-31 NOTE — Discharge Instructions (Signed)
YOU HAD AN ENDOSCOPIC PROCEDURE TODAY: Refer to the procedure report and other information in the discharge instructions given to you for any specific questions about what was found during the examination. If this information does not answer your questions, please call [facility] office at [phone] to clarify.   YOU SHOULD EXPECT: Some feelings of bloating in the abdomen. Passage of more gas than usual. Walking can help get rid of the air that was put into your GI tract during the procedure and reduce the bloating.  DIET: Your first meal following the procedure should be a light meal and then it is ok to progress to your normal diet. A half-sandwich or bowl of soup is an example of a good first meal. Heavy or fried foods are harder to digest and may make you feel nauseous or bloated. Drink plenty of fluids but you should avoid alcoholic beverages for 24 hours.   ACTIVITY: Your care partner should take you home directly after the procedure. You should plan to take it easy, moving slowly for the rest of the day. You can resume normal activity the day after the procedure however YOU SHOULD NOT DRIVE, use power tools, machinery or perform tasks that involve climbing or major physical exertion for 24 hours (because of the sedation medicines used during the test).   SYMPTOMS TO REPORT IMMEDIATELY: A gastroenterologist can be reached at any hour. Please call [phone]  for any of the following symptoms:    Following upper endoscopy (EGD, EUS, ERCP, esophageal dilation) Vomiting of blood or coffee ground material  New, significant abdominal pain  New, significant chest pain or pain under the shoulder blades  Painful or persistently difficult swallowing  New shortness of breath  Black, tarry-looking or red, bloody stools  FOLLOW UP:  If any biopsies were taken you will be contacted by phone or by letter within the next 1-3 weeks. Call [phone]  if you have not heard about the biopsies in 3 weeks.   Please also call with any specific questions about appointments or follow up tests.

## 2023-12-31 NOTE — Op Note (Signed)
 [facility] Patient Name: Megan [name] Procedure Date: 12/31/2023 MRN: [id number] Attending MD: Yong Henle , MD, [id number] Date of Birth: October 21, 1960 CSN: [id number] Age: 63 Admit Type: Outpatient Procedure:                Upper EUS Indications:              Gastric mucosal mass/polyp found on endoscopy,                            Large mucosal folds found in the stomach on                            endoscopy, Iron  deficiency anemia Providers:                Yong Henle, MD, Lorenzo Romberg, RN, Judith Novak, Technician Referring MD:             Yong Henle, MD Medicines:                Monitored Anesthesia Care Complications:            No immediate complications. Estimated Blood Loss:     Estimated blood loss was minimal. Procedure:                Pre-Anesthesia Assessment:                           - Prior to the procedure, a History and Physical                            was performed, and patient medications and                            allergies were reviewed. The patient's tolerance of                            previous anesthesia was also reviewed. The risks                            and benefits of the procedure and the sedation                            options and risks were discussed with the patient.                            All questions were answered, and informed consent                            was obtained. Prior Anticoagulants: The patient has                            taken no anticoagulant or antiplatelet agents  except for aspirin . ASA Grade Assessment: III - A                            patient with severe systemic disease. After                            reviewing the risks and benefits, the patient was                            deemed in satisfactory condition to undergo the                            procedure.                           After obtaining  informed consent, the endoscope was                            passed under direct vision. Throughout the                            procedure, the patient's blood pressure, pulse, and                            oxygen  saturations were monitored continuously. The                            [id number] ([id number]) Olympus therapeutic endoscope                            was introduced through the mouth, and advanced to                            the second part of duodenum. The [id number]                            ([id number]) Olympus radial ultrasound scope was                            introduced through the mouth, and advanced to the                            stomach for ultrasound examination. The upper EUS                            was accomplished without difficulty. The patient                            tolerated the procedure. Scope In: Scope Out: Findings:      ENDOSCOPIC FINDING: :      No gross lesions were noted in the entire esophagus.      The Z-line was irregular and was found 39 cm from the incisors.      A 2 cm hiatal hernia was present.  Patchy atrophic mucosa was found in the entire examined stomach.       Biopsies were taken with a cold forceps for histology and Helicobacter       pylori testing.      Localized thick gastric folds/polypoid lesions with evidence of erosion       and spontaneous oozing upon suctioning were found in the gastric antrum       (3 lesions with largest prolapsing at times into the duodenum). 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 To       help decrease the risk of anemia from chronic blood loss it is       reasonable to try to resect these areas. After the EUS was completed,       Two ligatures were successfully placed in effort of improving       visualization. There was no bleeding at the end of the procedure. There       remains 1 larger lesion still present.      No gross lesions were noted  in the duodenal bulb, in the first portion       of the duodenum and in the second portion of the duodenum.      ENDOSONOGRAPHIC FINDING: :      Localized wall thickening was visualized endosonographically in the       antrum of the stomach. This appeared to primarily be due to thickening       within the deep mucosa (Layer 2) and submucosa (Layer 3). The largest       area of abnormality had thickness of the abnormal layers measured 13 mm       by 26 mm.      Endosonographic imaging in the visualized portion of the liver showed no       mass.      No malignant-appearing lymph nodes were visualized in the celiac region       (level 20) and perigastric region.      The celiac region was visualized. Impression:               EGD impression:                           - No gross lesions in the entire esophagus. Z-line                            irregular, 39 cm from the incisors.                           - 2 cm hiatal hernia.                           - Gastric mucosal atrophy. Biopsied.                           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 Ligated 2 of the                            regions. 1 larger lesion still remains in place.                           - No gross lesions in the duodenal bulb, in the                            first portion of the duodenum and in the second                            portion of the duodenum.                           EUS impression:                           - Wall thickening was seen in the antrum of the                            stomach. The thickening appeared to be primarily                            within the deep mucosa (Layer 2) and submucosa                            (Layer 3).                           - No malignant-appearing lymph nodes were                            visualized  in the celiac region (level 20) and                            perigastric region. Moderate Sedation:      Not Applicable - Patient had care per Anesthesia. Recommendation:           - The patient will be observed post-procedure,                            until all discharge criteria are met.                           - Discharge patient to home.                           - Patient has a contact number available for                            emergencies. The signs and symptoms of potential                            delayed complications were  discussed with the                            patient. Return to normal activities tomorrow.                            Written discharge instructions were provided to the                            patient.                           - Full liquid diet initially today and then may                            advance to soft diet if tolerating.                           - Observe patient's clinical course.                           - Continue PPI 40 mg twice daily.                           - Carafate  1 g twice daily for 14 days then may                            stop (doing a shorter time period than normal as                            result of ESRD).                           - May continue present medications otherwise.                           - Await path results.                           - Repeat the upper endoscopic ultrasound in 4                            months for retreatment.                           - The findings and recommendations were discussed                            with the patient.                           - The findings and recommendations were discussed                            with the patient's family. Procedure Code(s):        --- Professional ---  43255, 59, Esophagogastroduodenoscopy, flexible,                            transoral; with control of bleeding, any method                            43237, Esophagogastroduodenoscopy, flexible,                            transoral; with endoscopic ultrasound examination                            limited to the esophagus, stomach or duodenum, and                            adjacent structures                           43239, Esophagogastroduodenoscopy, flexible,                            transoral; with biopsy, single or multiple Diagnosis Code(s):        --- Professional ---                           K22.89, Other specified disease of esophagus                           K44.9, Diaphragmatic hernia without obstruction or                            gangrene                           K31.89, Other diseases of stomach and duodenum                           K29.61, Other gastritis with bleeding                           I89.9, Noninfective disorder of lymphatic vessels                            and lymph nodes, unspecified                           D50.9, Iron  deficiency anemia, unspecified CPT copyright 2022 American Medical Association. All rights reserved. The codes documented in this report are preliminary and upon coder review may  be revised to meet current compliance requirements. Yong Henle, MD 12/31/2023 11:40:44 AM Number of Addenda: 0

## 2024-01-01 DIAGNOSIS — D509 Iron deficiency anemia, unspecified: Secondary | ICD-10-CM | POA: Diagnosis not present

## 2024-01-01 DIAGNOSIS — D631 Anemia in chronic kidney disease: Secondary | ICD-10-CM | POA: Diagnosis not present

## 2024-01-01 DIAGNOSIS — Z992 Dependence on renal dialysis: Secondary | ICD-10-CM | POA: Diagnosis not present

## 2024-01-01 DIAGNOSIS — N2581 Secondary hyperparathyroidism of renal origin: Secondary | ICD-10-CM | POA: Diagnosis not present

## 2024-01-01 DIAGNOSIS — N186 End stage renal disease: Secondary | ICD-10-CM | POA: Diagnosis not present

## 2024-01-01 DIAGNOSIS — E1129 Type 2 diabetes mellitus with other diabetic kidney complication: Secondary | ICD-10-CM | POA: Diagnosis not present

## 2024-01-01 NOTE — Anesthesia Postprocedure Evaluation (Signed)
 Anesthesia Post Note  Patient: JALYNNE PERSICO  Procedure(s) Performed: ULTRASOUND, UPPER GI TRACT, ENDOSCOPIC EGD (ESOPHAGOGASTRODUODENOSCOPY)     Patient location during evaluation: Endoscopy Anesthesia Type: MAC Level of consciousness: awake and alert Pain management: pain level controlled Vital Signs Assessment: post-procedure vital signs reviewed and stable Respiratory status: spontaneous breathing, nonlabored ventilation, respiratory function stable and patient connected to nasal cannula oxygen  Cardiovascular status: stable and blood pressure returned to baseline Postop Assessment: no apparent nausea or vomiting Anesthetic complications: no   No notable events documented.  Last Vitals:  Vitals:   12/31/23 1130 12/31/23 1140  BP: (!) 170/68 (!) 174/80  Pulse: 72 70  Resp: 16 (!) 21  Temp:    SpO2: 96% 96%    Last Pain:  Vitals:   12/31/23 1140  TempSrc:   PainSc: 0-No pain                 Yoceline Bazar S

## 2024-01-02 ENCOUNTER — Encounter (HOSPITAL_COMMUNITY): Payer: Self-pay | Admitting: Gastroenterology

## 2024-01-02 DIAGNOSIS — E1129 Type 2 diabetes mellitus with other diabetic kidney complication: Secondary | ICD-10-CM | POA: Diagnosis not present

## 2024-01-02 DIAGNOSIS — E119 Type 2 diabetes mellitus without complications: Secondary | ICD-10-CM | POA: Diagnosis not present

## 2024-01-02 DIAGNOSIS — N2581 Secondary hyperparathyroidism of renal origin: Secondary | ICD-10-CM | POA: Diagnosis not present

## 2024-01-02 DIAGNOSIS — E039 Hypothyroidism, unspecified: Secondary | ICD-10-CM | POA: Diagnosis not present

## 2024-01-02 DIAGNOSIS — N186 End stage renal disease: Secondary | ICD-10-CM | POA: Diagnosis not present

## 2024-01-02 DIAGNOSIS — D631 Anemia in chronic kidney disease: Secondary | ICD-10-CM | POA: Diagnosis not present

## 2024-01-02 DIAGNOSIS — Z992 Dependence on renal dialysis: Secondary | ICD-10-CM | POA: Diagnosis not present

## 2024-01-02 DIAGNOSIS — D509 Iron deficiency anemia, unspecified: Secondary | ICD-10-CM | POA: Diagnosis not present

## 2024-01-03 LAB — SURGICAL PATHOLOGY

## 2024-01-04 ENCOUNTER — Ambulatory Visit: Payer: Self-pay | Admitting: Gastroenterology

## 2024-01-04 DIAGNOSIS — N2581 Secondary hyperparathyroidism of renal origin: Secondary | ICD-10-CM | POA: Diagnosis not present

## 2024-01-04 DIAGNOSIS — D631 Anemia in chronic kidney disease: Secondary | ICD-10-CM | POA: Diagnosis not present

## 2024-01-04 DIAGNOSIS — E1129 Type 2 diabetes mellitus with other diabetic kidney complication: Secondary | ICD-10-CM | POA: Diagnosis not present

## 2024-01-04 DIAGNOSIS — Z992 Dependence on renal dialysis: Secondary | ICD-10-CM | POA: Diagnosis not present

## 2024-01-04 DIAGNOSIS — D509 Iron deficiency anemia, unspecified: Secondary | ICD-10-CM | POA: Diagnosis not present

## 2024-01-04 DIAGNOSIS — N186 End stage renal disease: Secondary | ICD-10-CM | POA: Diagnosis not present

## 2024-01-07 DIAGNOSIS — E1129 Type 2 diabetes mellitus with other diabetic kidney complication: Secondary | ICD-10-CM | POA: Diagnosis not present

## 2024-01-07 DIAGNOSIS — N2581 Secondary hyperparathyroidism of renal origin: Secondary | ICD-10-CM | POA: Diagnosis not present

## 2024-01-07 DIAGNOSIS — D509 Iron deficiency anemia, unspecified: Secondary | ICD-10-CM | POA: Diagnosis not present

## 2024-01-07 DIAGNOSIS — Z992 Dependence on renal dialysis: Secondary | ICD-10-CM | POA: Diagnosis not present

## 2024-01-07 DIAGNOSIS — D631 Anemia in chronic kidney disease: Secondary | ICD-10-CM | POA: Diagnosis not present

## 2024-01-07 DIAGNOSIS — N186 End stage renal disease: Secondary | ICD-10-CM | POA: Diagnosis not present

## 2024-01-09 ENCOUNTER — Encounter: Payer: Self-pay | Admitting: Gastroenterology

## 2024-01-09 DIAGNOSIS — E1129 Type 2 diabetes mellitus with other diabetic kidney complication: Secondary | ICD-10-CM | POA: Diagnosis not present

## 2024-01-09 DIAGNOSIS — D631 Anemia in chronic kidney disease: Secondary | ICD-10-CM | POA: Diagnosis not present

## 2024-01-09 DIAGNOSIS — Z992 Dependence on renal dialysis: Secondary | ICD-10-CM | POA: Diagnosis not present

## 2024-01-09 DIAGNOSIS — D509 Iron deficiency anemia, unspecified: Secondary | ICD-10-CM | POA: Diagnosis not present

## 2024-01-09 DIAGNOSIS — N2581 Secondary hyperparathyroidism of renal origin: Secondary | ICD-10-CM | POA: Diagnosis not present

## 2024-01-09 DIAGNOSIS — N186 End stage renal disease: Secondary | ICD-10-CM | POA: Diagnosis not present

## 2024-01-10 ENCOUNTER — Telehealth: Payer: Self-pay | Admitting: Gastroenterology

## 2024-01-10 ENCOUNTER — Ambulatory Visit: Payer: Self-pay | Admitting: Gastroenterology

## 2024-01-10 ENCOUNTER — Other Ambulatory Visit: Payer: Self-pay | Admitting: Gastroenterology

## 2024-01-10 DIAGNOSIS — K625 Hemorrhage of anus and rectum: Secondary | ICD-10-CM

## 2024-01-10 LAB — CBC WITH DIFFERENTIAL/PLATELET
Absolute Lymphocytes: 1228 {cells}/uL (ref 850–3900)
Absolute Monocytes: 785 {cells}/uL (ref 200–950)
Basophils Absolute: 53 {cells}/uL (ref 0–200)
Basophils Relative: 0.8 %
Eosinophils Absolute: 211 {cells}/uL (ref 15–500)
Eosinophils Relative: 3.2 %
HCT: 33 % — ABNORMAL LOW (ref 35.0–45.0)
Hemoglobin: 10.8 g/dL — ABNORMAL LOW (ref 11.7–15.5)
MCH: 37.1 pg — ABNORMAL HIGH (ref 27.0–33.0)
MCHC: 32.7 g/dL (ref 32.0–36.0)
MCV: 113.4 fL — ABNORMAL HIGH (ref 80.0–100.0)
MPV: 10 fL (ref 7.5–12.5)
Monocytes Relative: 11.9 %
Neutro Abs: 4323 {cells}/uL (ref 1500–7800)
Neutrophils Relative %: 65.5 %
Platelets: 255 10*3/uL (ref 140–400)
RBC: 2.91 10*6/uL — ABNORMAL LOW (ref 3.80–5.10)
RDW: 13.6 % (ref 11.0–15.0)
Total Lymphocyte: 18.6 %
WBC: 6.6 10*3/uL (ref 3.8–10.8)

## 2024-01-10 LAB — BASIC METABOLIC PANEL WITH GFR
BUN/Creatinine Ratio: 5 (calc) — ABNORMAL LOW (ref 6–22)
BUN: 47 mg/dL — ABNORMAL HIGH (ref 7–25)
CO2: 30 mmol/L (ref 20–32)
Calcium: 9.7 mg/dL (ref 8.6–10.4)
Chloride: 96 mmol/L — ABNORMAL LOW (ref 98–110)
Creat: 9.08 mg/dL — ABNORMAL HIGH (ref 0.50–1.05)
Glucose, Bld: 121 mg/dL — ABNORMAL HIGH (ref 65–99)
Potassium: 4 mmol/L (ref 3.5–5.3)
Sodium: 139 mmol/L (ref 135–146)
eGFR: 5 mL/min/{1.73_m2} — ABNORMAL LOW (ref >=60)

## 2024-01-10 NOTE — Telephone Encounter (Signed)
 Noted and thank you

## 2024-01-10 NOTE — Telephone Encounter (Signed)
 [name], Please reach out to the patient there tomorrow and let her know that her blood counts are stable.  In the setting of her blood counts being normal, it is felt that this is less likely to be related from an upper GI standpoint. However, she needs to be mindful of this.  If things continue to be an issue, her primary GI team may need to consider whether any additional workup could be required for more likely hemorrhoidal issues or colorectal issues. I will forward these results to her primary GI team as well. Thanks. GM  FYI RMR and AWB regards to potential need for follow-up in the setting of her stable hemoglobin thus more likely to be not upper GI related from recent procedure.  GM

## 2024-01-10 NOTE — Telephone Encounter (Signed)
 The pt had an EUS on 5/19 referred by [facility].  She states she has doenvery well since EUS but since Sunday she has developed BRBPR with every BM and also at times with no BM.  No other complaints at this time.  I have advised that she call her Primary GI and make them aware. I will also send to Dr Brice Campi for review.

## 2024-01-10 NOTE — Telephone Encounter (Signed)
 Noted, will do

## 2024-01-10 NOTE — Telephone Encounter (Signed)
 Spoke with the pt and she is going to [place] to have labs at [facility].  The order was entered.

## 2024-01-10 NOTE — Telephone Encounter (Signed)
 [name], I am sorry to hear this. Seems a little bit off but certainly we cannot completely exclude that this could not be coming from a postprocedural bleeding post band ligation of the gastric nodularity/GAVE. I would expect however she was having hematochezia, she would be hemodynamically unstable and not having bowel movements for the last 4 days with bright red blood. Looking at her colonoscopy from 2022 looks like she has had hemorrhoids. I would recommend that she get laboratories at a minimum to see what her blood counts are looking like as long as she is feeling painless hematochezia. If her blood counts are low, she may need to come to the hospital for further evaluation and potential endoscopic reevaluation.  Please have patient come in for CBC (stat) and BMP (stat) so that we can evaluate the blood count and the BUN level. She can have this done in [place]. I am away on Friday, so if she wants to have these labs so that I can view them I would have her come and get them today. I am putting her [place] GI team on here as well.  FYI RMR and AWB

## 2024-01-10 NOTE — Telephone Encounter (Signed)
 Order faxed to [phone] [facility]

## 2024-01-10 NOTE — Telephone Encounter (Signed)
 The patient has been notified of this information and all questions answered.  She will follow up with [facility] with any further issues

## 2024-01-10 NOTE — Telephone Encounter (Signed)
 Patient called and stated that she had a procedure done on May 19 th. Patient stated that she was fine until this Sunday, she is experiencing rectal bleeding. Patient is requesting a call back to speak to the nurse. Please advise.

## 2024-01-11 DIAGNOSIS — E1129 Type 2 diabetes mellitus with other diabetic kidney complication: Secondary | ICD-10-CM | POA: Diagnosis not present

## 2024-01-11 DIAGNOSIS — N186 End stage renal disease: Secondary | ICD-10-CM | POA: Diagnosis not present

## 2024-01-11 DIAGNOSIS — N2581 Secondary hyperparathyroidism of renal origin: Secondary | ICD-10-CM | POA: Diagnosis not present

## 2024-01-11 DIAGNOSIS — D509 Iron deficiency anemia, unspecified: Secondary | ICD-10-CM | POA: Diagnosis not present

## 2024-01-11 DIAGNOSIS — Z992 Dependence on renal dialysis: Secondary | ICD-10-CM | POA: Diagnosis not present

## 2024-01-11 DIAGNOSIS — D631 Anemia in chronic kidney disease: Secondary | ICD-10-CM | POA: Diagnosis not present

## 2024-01-13 DIAGNOSIS — Z992 Dependence on renal dialysis: Secondary | ICD-10-CM | POA: Diagnosis not present

## 2024-01-13 DIAGNOSIS — N040 Nephrotic syndrome with minor glomerular abnormality: Secondary | ICD-10-CM | POA: Diagnosis not present

## 2024-01-13 DIAGNOSIS — N186 End stage renal disease: Secondary | ICD-10-CM | POA: Diagnosis not present

## 2024-01-14 DIAGNOSIS — Z992 Dependence on renal dialysis: Secondary | ICD-10-CM | POA: Diagnosis not present

## 2024-01-14 DIAGNOSIS — N2581 Secondary hyperparathyroidism of renal origin: Secondary | ICD-10-CM | POA: Diagnosis not present

## 2024-01-14 DIAGNOSIS — N186 End stage renal disease: Secondary | ICD-10-CM | POA: Diagnosis not present

## 2024-01-14 DIAGNOSIS — E1129 Type 2 diabetes mellitus with other diabetic kidney complication: Secondary | ICD-10-CM | POA: Diagnosis not present

## 2024-01-14 DIAGNOSIS — D509 Iron deficiency anemia, unspecified: Secondary | ICD-10-CM | POA: Diagnosis not present

## 2024-01-14 DIAGNOSIS — D631 Anemia in chronic kidney disease: Secondary | ICD-10-CM | POA: Diagnosis not present

## 2024-01-14 DIAGNOSIS — E119 Type 2 diabetes mellitus without complications: Secondary | ICD-10-CM | POA: Diagnosis not present

## 2024-01-16 ENCOUNTER — Encounter (HOSPITAL_COMMUNITY): Payer: Self-pay

## 2024-01-16 ENCOUNTER — Emergency Department (HOSPITAL_COMMUNITY)

## 2024-01-16 ENCOUNTER — Ambulatory Visit: Payer: Self-pay

## 2024-01-16 ENCOUNTER — Emergency Department (HOSPITAL_COMMUNITY)
Admission: EM | Admit: 2024-01-16 | Discharge: 2024-01-16 | Disposition: A | Discharge status: Home / Self Care | Attending: Emergency Medicine | Admitting: Emergency Medicine

## 2024-01-16 ENCOUNTER — Other Ambulatory Visit: Payer: Self-pay

## 2024-01-16 DIAGNOSIS — Z7982 Long term (current) use of aspirin: Secondary | ICD-10-CM | POA: Insufficient documentation

## 2024-01-16 DIAGNOSIS — D509 Iron deficiency anemia, unspecified: Secondary | ICD-10-CM | POA: Diagnosis not present

## 2024-01-16 DIAGNOSIS — I2510 Atherosclerotic heart disease of native coronary artery without angina pectoris: Secondary | ICD-10-CM | POA: Insufficient documentation

## 2024-01-16 DIAGNOSIS — N186 End stage renal disease: Secondary | ICD-10-CM | POA: Diagnosis not present

## 2024-01-16 DIAGNOSIS — R0789 Other chest pain: Secondary | ICD-10-CM | POA: Diagnosis not present

## 2024-01-16 DIAGNOSIS — Z79899 Other long term (current) drug therapy: Secondary | ICD-10-CM | POA: Diagnosis not present

## 2024-01-16 DIAGNOSIS — E1122 Type 2 diabetes mellitus with diabetic chronic kidney disease: Secondary | ICD-10-CM | POA: Diagnosis not present

## 2024-01-16 DIAGNOSIS — Z853 Personal history of malignant neoplasm of breast: Secondary | ICD-10-CM | POA: Diagnosis not present

## 2024-01-16 DIAGNOSIS — I120 Hypertensive chronic kidney disease with stage 5 chronic kidney disease or end stage renal disease: Secondary | ICD-10-CM | POA: Insufficient documentation

## 2024-01-16 DIAGNOSIS — E876 Hypokalemia: Secondary | ICD-10-CM | POA: Diagnosis not present

## 2024-01-16 DIAGNOSIS — Z992 Dependence on renal dialysis: Secondary | ICD-10-CM | POA: Insufficient documentation

## 2024-01-16 DIAGNOSIS — R079 Chest pain, unspecified: Secondary | ICD-10-CM | POA: Diagnosis present

## 2024-01-16 DIAGNOSIS — N2581 Secondary hyperparathyroidism of renal origin: Secondary | ICD-10-CM | POA: Diagnosis not present

## 2024-01-16 DIAGNOSIS — E1129 Type 2 diabetes mellitus with other diabetic kidney complication: Secondary | ICD-10-CM | POA: Diagnosis not present

## 2024-01-16 DIAGNOSIS — D631 Anemia in chronic kidney disease: Secondary | ICD-10-CM | POA: Diagnosis not present

## 2024-01-16 DIAGNOSIS — M25511 Pain in right shoulder: Secondary | ICD-10-CM | POA: Insufficient documentation

## 2024-01-16 DIAGNOSIS — G8929 Other chronic pain: Secondary | ICD-10-CM | POA: Insufficient documentation

## 2024-01-16 LAB — CBC WITH DIFFERENTIAL/PLATELET
Abs Immature Granulocytes: 0.06 10*3/uL (ref 0.00–0.07)
Basophils Absolute: 0.1 10*3/uL (ref 0.0–0.1)
Basophils Relative: 1 %
Eosinophils Absolute: 0.3 10*3/uL (ref 0.0–0.5)
Eosinophils Relative: 4 %
HCT: 38.4 % (ref 36.0–46.0)
Hemoglobin: 12.5 g/dL (ref 12.0–15.0)
Immature Granulocytes: 1 %
Lymphocytes Relative: 13 %
Lymphs Abs: 1.2 10*3/uL (ref 0.7–4.0)
MCH: 38.2 pg — ABNORMAL HIGH (ref 26.0–34.0)
MCHC: 32.6 g/dL (ref 30.0–36.0)
MCV: 117.4 fL — ABNORMAL HIGH (ref 80.0–100.0)
Monocytes Absolute: 1.1 10*3/uL — ABNORMAL HIGH (ref 0.1–1.0)
Monocytes Relative: 13 %
Neutro Abs: 6.1 10*3/uL (ref 1.7–7.7)
Neutrophils Relative %: 68 %
Platelets: 271 10*3/uL (ref 150–400)
RBC: 3.27 MIL/uL — ABNORMAL LOW (ref 3.87–5.11)
RDW: 14.7 % (ref 11.5–15.5)
WBC: 8.9 10*3/uL (ref 4.0–10.5)
nRBC: 0.2 % (ref 0.0–0.2)

## 2024-01-16 LAB — TROPONIN I (HIGH SENSITIVITY)
Troponin I (High Sensitivity): 24 ng/L — ABNORMAL HIGH (ref <18)
Troponin I (High Sensitivity): 22 ng/L — ABNORMAL HIGH (ref <18)

## 2024-01-16 LAB — COMPREHENSIVE METABOLIC PANEL WITH GFR
ALT: 28 U/L (ref 0–44)
AST: 21 U/L (ref 15–41)
Albumin: 3.9 g/dL (ref 3.5–5.0)
Alkaline Phosphatase: 146 U/L — ABNORMAL HIGH (ref 38–126)
Anion gap: 15 (ref 5–15)
BUN: 25 mg/dL — ABNORMAL HIGH (ref 8–23)
CO2: 28 mmol/L (ref 22–32)
Calcium: 9.9 mg/dL (ref 8.9–10.3)
Chloride: 91 mmol/L — ABNORMAL LOW (ref 98–111)
Creatinine, Ser: 6.25 mg/dL — ABNORMAL HIGH (ref 0.44–1.00)
GFR, Estimated: 7 mL/min — ABNORMAL LOW (ref >60)
Glucose, Bld: 105 mg/dL — ABNORMAL HIGH (ref 70–99)
Potassium: 3.2 mmol/L — ABNORMAL LOW (ref 3.5–5.1)
Sodium: 134 mmol/L — ABNORMAL LOW (ref 135–145)
Total Bilirubin: 0.6 mg/dL (ref 0.0–1.2)
Total Protein: 8.8 g/dL — ABNORMAL HIGH (ref 6.5–8.1)

## 2024-01-16 MED ORDER — MORPHINE SULFATE (PF) 4 MG/ML IV SOLN
4.0000 mg | Freq: Once | INTRAVENOUS | Status: AC
  Administered 2024-01-16: 4 mg via INTRAVENOUS
  Filled 2024-01-16: qty 1

## 2024-01-16 MED ORDER — ONDANSETRON HCL 4 MG/2ML IJ SOLN
4.0000 mg | Freq: Once | INTRAMUSCULAR | Status: AC
  Administered 2024-01-16: 4 mg via INTRAVENOUS
  Filled 2024-01-16: qty 2

## 2024-01-16 NOTE — Discharge Instructions (Addendum)
 Your workup today was reassuring.  I have placed a referral for you for cardiology.  Someone from their office should be contacting you to arrange follow-up appointment.  Also please follow-up with your primary care for recheck.  Return to emergency department for any new or worsening symptoms.

## 2024-01-16 NOTE — ED Notes (Signed)
 Patient attempted to give us  a urine sample however was unsuccessful, patient stated that she sometimes is able to produce urine and sometimes she is not.

## 2024-01-16 NOTE — ED Provider Notes (Signed)
 [place] EMERGENCY DEPARTMENT AT [facility] Provider Note   CSN: [id number] Arrival date & time: 01/16/24  0930     History  Chief Complaint  Patient presents with   Chest Pain    Megan [name] is a 63 y.o. female.   Chest Pain Associated symptoms: headache and nausea   Associated symptoms: no abdominal pain, no cough, no fever, no numbness, no shortness of breath, no vomiting and no weakness         Megan [name] is a 63 y.o. female past medical history of hypertension, type 2 diabetes, anemia, CAD, breast cancer and end-stage renal disease on hemodialysis Monday Wednesday Friday.  She presents to the Emergency Department complaining of left-sided chest pain during dialysis today.  She states she completed her entire treatment but had 3 intermittent episodes of left-sided chest pain during that time.  Pain was not associated with shortness of breath arm neck or jaw pain.  During her treatment, she had several brief episodes in which her blood pressure dropped during her dialysis.  Her symptoms were associated with nausea but no vomiting.  She also complains of pain of her right arm and shoulder area for "months."  No new or worsening pain of her right arm or shoulder.  Also describes having a throbbing headache mainly located at her temples.  Denies any visual changes neck pain or stiffness.  No fever or chills.  She denies having any chest pain at this time.  Home Medications Prior to Admission medications   Medication Sig Start Date End Date Taking? Authorizing Provider  amLODipine  (NORVASC ) 10 MG tablet TAKE 1 TABLET BY MOUTH AT BEDTIME 08/07/23   Austine Lefort, MD  aspirin  EC 81 MG tablet Take 81 mg by mouth daily.    [provider]  atorvastatin  (LIPITOR) 20 MG tablet TAKE 1 TABLET(20 MG) BY MOUTH DAILY 11/13/23   Austine Lefort, MD  B Complex-C-Zn-Folic Acid  (DIALYVITE  800-ZINC  15) 0.8 MG TABS Take 1 tablet by mouth daily. 06/19/22    [provider]  cinacalcet  (SENSIPAR ) 60 MG tablet Take 60 mg by mouth daily.    [provider]  Darbepoetin Alfa  (ARANESP , ALBUMIN FREE, IJ) Darbepoetin Alfa  (Aranesp ) 07/06/23 07/04/24  [provider]  dicyclomine  (BENTYL ) 10 MG capsule Take 1 capsule (10 mg total) by mouth 2 (two) times daily as needed for up to 10 days for spasms. 12/31/23 01/10/24  Mansouraty, Albino Alu., MD  gabapentin  (NEURONTIN ) 300 MG capsule TAKE 1 CAPSULE(300 MG) BY MOUTH AT BEDTIME 06/12/23   Austine Lefort, MD  iron  sucrose (VENOFER ) 20 MG/ML injection Inject 50 mg into the vein once a week. 02/19/23 02/18/24  [provider]  lanthanum  (FOSRENOL ) 1000 MG chewable tablet Chew 2,000-3,000 mg by mouth See admin instructions. Take 3 tablets (3000 mg) by mouth with meals and take 2 tablets (2000 mg) with snacks 10/16/19   [provider]  lidocaine -prilocaine  (EMLA ) cream APPLY TOPICALLY TO THE AFFECTED AREA 1 TIME 10/01/23   Paulett Boros, MD  meclizine  (ANTIVERT ) 25 MG tablet Take 1 tablet (25 mg total) by mouth 3 (three) times daily as needed for dizziness. 06/12/23   Austine Lefort, MD  nebivolol  (BYSTOLIC ) 10 MG tablet TAKE 1 TABLET BY MOUTH IN THE MORNING AND AT BEDTIME 11/20/23   Austine Lefort, MD  nitroGLYCERIN  (NITROSTAT ) 0.4 MG SL tablet Place 1 tablet (0.4 mg total) under the tongue every 5 (five) minutes x 3 doses as needed  for chest pain (if no relief after 3rd dose, proceed to ED or call 911). 09/13/22   Laurann Pollock, MD  oxyCODONE  (ROXICODONE ) 5 MG immediate release tablet Take 1 tablet (5 mg total) by mouth every 4 (four) hours as needed. 11/29/23 11/28/24  [name], MD  pantoprazole  (PROTONIX ) 40 MG tablet Take 1 tablet (40 mg total) by mouth 2 (two) times daily before a meal. 10/08/23   Rourk, Windsor Hatcher, MD  sucralfate  (CARAFATE ) 1 g tablet Take 1 tablet (1 g total) by mouth 2 (two) times daily for 14 days. 12/31/23 01/14/24  Mansouraty, Albino Alu., MD       Allergies    Motrin [ibuprofen]    Review of Systems   Review of Systems  Constitutional:  Negative for chills and fever.  Respiratory:  Negative for cough and shortness of breath.   Cardiovascular:  Positive for chest pain.  Gastrointestinal:  Positive for nausea. Negative for abdominal pain and vomiting.  Musculoskeletal:  Negative for neck pain and neck stiffness.  Neurological:  Positive for headaches. Negative for syncope, weakness and numbness.  Psychiatric/Behavioral:  Negative for confusion.     Physical Exam Updated Vital Signs BP 130/74   Pulse 79   Temp 98 F (36.7 C) (Oral)   Resp 20   Ht 5\' 1"  (1.549 m)   Wt 80.3 kg   SpO2 94%   BMI 33.44 kg/m  Physical Exam Vitals and nursing note reviewed.  Constitutional:      General: She is not in acute distress.    Appearance: She is well-developed. She is not ill-appearing or toxic-appearing.  Eyes:     Conjunctiva/sclera: Conjunctivae normal.     Pupils: Pupils are equal, round, and reactive to light.  Cardiovascular:     Rate and Rhythm: Normal rate and regular rhythm.     Pulses: Normal pulses.     Comments: AV Fistula with palpable thrill left forearm Pulmonary:     Effort: Pulmonary effort is normal.     Breath sounds: Normal breath sounds.  Chest:     Chest wall: No tenderness.  Abdominal:     Palpations: Abdomen is soft.     Tenderness: There is no abdominal tenderness.  Musculoskeletal:        General: Tenderness present. No swelling or signs of injury.     Cervical back: Normal range of motion. No rigidity or tenderness.     Comments: Patient has full range of motion of the right shoulder, nontender to palpation  Skin:    General: Skin is warm.     Capillary Refill: Capillary refill takes less than 2 seconds.  Neurological:     General: No focal deficit present.     Mental Status: She is alert.     Sensory: No sensory deficit.     Motor: No weakness.     ED Results / Procedures /  Treatments   Labs (all labs ordered are listed, but only abnormal results are displayed) Labs Reviewed  CBC WITH DIFFERENTIAL/PLATELET - Abnormal; Notable for the following components:      Result Value   RBC 3.27 (*)    MCV 117.4 (*)    MCH 38.2 (*)    Monocytes Absolute 1.1 (*)    All other components within normal limits  COMPREHENSIVE METABOLIC PANEL WITH GFR - Abnormal; Notable for the following components:   Sodium 134 (*)    Potassium 3.2 (*)    Chloride 91 (*)  Glucose, Bld 105 (*)    BUN 25 (*)    Creatinine, Ser 6.25 (*)    Total Protein 8.8 (*)    Alkaline Phosphatase 146 (*)    GFR, Estimated 7 (*)    All other components within normal limits  TROPONIN I (HIGH SENSITIVITY) - Abnormal; Notable for the following components:   Troponin I (High Sensitivity) 24 (*)    All other components within normal limits  TROPONIN I (HIGH SENSITIVITY) - Abnormal; Notable for the following components:   Troponin I (High Sensitivity) 22 (*)    All other components within normal limits    EKG EKG Interpretation Date/Time:  Wednesday January 16 2024 09:52:31 EDT Ventricular Rate:  79 PR Interval:  148 QRS Duration:  91 QT Interval:  417 QTC Calculation: 478 R Axis:   14  Text Interpretation: Sinus rhythm Left atrial enlargement LVH with secondary repolarization abnormality similar to Mar 2025 Confirmed by Jerilynn Montenegro [phone]) on 01/16/2024 9:55:11 AM  Radiology No results found.  Procedures Procedures    Medications Ordered in ED Medications - No data to display  ED Course/ Medical Decision Making/ A&P                                 Medical Decision Making Patient with end-stage renal disease, prior breast cancer with bilateral mastectomy.  Intermittent episodes of left-sided chest pain during hemodialysis today.  Completed entire treatment.  Also chest pain was associated with nausea and headache.  No left arm neck or jaw pain.  States her blood pressure dropped on  several occasions during her treatment.  She denies any chest pain or shortness of breath at this time.  ACS, PE, musculoskeletal pain, pneumonia all considered  Amount and/or Complexity of Data Reviewed Labs: ordered.    Details: Labs mild hypokalemia, trop mildly elevated but delta is flat and trop seems to be consistently elevated over time Radiology: ordered.    Details: CXR reassuring ECG/medicine tests: ordered.    Details: Sinus rhythm Discussion of management or test interpretation with external provider(s): Work up reassuring.  Chronic right shoulder pain and headache addressed here, no left sided chest pain here and her vitals have been reassuring.  No hypotension here.  pt feeling better, ready for d/c.  Has close out pt f/u with PCP.    Risk Prescription drug management.           Final Clinical Impression(s) / ED Diagnoses Final diagnoses:  Nonspecific chest pain  Chronic right shoulder pain    Rx / DC Orders ED Discharge Orders     None         Catherne Clubs, PA-C 01/18/24 1754    Jerilynn Montenegro, MD 01/21/24 [phone]

## 2024-01-16 NOTE — Telephone Encounter (Signed)
 FYI Only or Action Required?: FYI only for provider  Patient was last seen in primary care on 11/02/23 with Dr. Cheril Cork. Called Nurse Triage reporting Chest Pain. Symptoms began several days ago. Interventions attempted: Nothing. Symptoms are: gradually worsening.  Triage Disposition: Go to ED Now (Notify PCP)  Patient/caregiver understands and will follow disposition?: Yes        Copied from CRM [id number]. Topic: Clinical - Red Word Triage >> Jan 16, 2024  9:14 AM Ethelle Herb L wrote: Red Word that prompted transfer to Nurse Triage: pain in arm and chest Reason for Disposition  Dizziness or lightheadedness  Answer Assessment - Initial Assessment Questions 1. LOCATION: "Where does it hurt?"       Right arm, chest 2. RADIATION: "Does the pain go anywhere else?" (e.g., into neck, jaw, arms, back)     Right arm 3. ONSET: "When did the chest pain begin?" (Minutes, hours or days)      X 1 week 4. PATTERN: "Does the pain come and go, or has it been constant since it started?"  "Does it get worse with exertion?"      Intermittent 5. DURATION: "How long does it last" (e.g., seconds, minutes, hours)    About 30 min 6. SEVERITY: "How bad is the pain?"  (e.g., Scale 1-10; mild, moderate, or severe)    - MILD (1-3): doesn't interfere with normal activities     - MODERATE (4-7): interferes with normal activities or awakens from sleep    - SEVERE (8-10): excruciating pain, unable to do any normal activities       10/10 7. CARDIAC RISK FACTORS: "Do you have any history of heart problems or risk factors for heart disease?" (e.g., angina, prior heart attack; diabetes, high blood pressure, high cholesterol, smoker, or strong family history of heart disease)     History of DM  9. CAUSE: "What do you think is causing the chest pain?"     Unknown 10. OTHER SYMPTOMS: "Do you have any other symptoms?" (e.g., dizziness, nausea, vomiting, sweating, fever, difficulty breathing, cough)       HA,  nausea    Pt notes CP, SOB, dizziness, nausea, "severe HA" that began several days ago.  Protocols used: Chest Pain-A-AH

## 2024-01-16 NOTE — ED Triage Notes (Signed)
 Pt arrived via POV following her dialysis appointment this morning. Pt reports HA, nausea, and reports the pain was in her right chest for the past month, but following dialysis, the pain has radiated to her left chest.

## 2024-01-18 ENCOUNTER — Other Ambulatory Visit: Payer: Self-pay

## 2024-01-18 DIAGNOSIS — E1129 Type 2 diabetes mellitus with other diabetic kidney complication: Secondary | ICD-10-CM | POA: Diagnosis not present

## 2024-01-18 DIAGNOSIS — N186 End stage renal disease: Secondary | ICD-10-CM | POA: Diagnosis not present

## 2024-01-18 DIAGNOSIS — N2581 Secondary hyperparathyroidism of renal origin: Secondary | ICD-10-CM | POA: Diagnosis not present

## 2024-01-18 DIAGNOSIS — Z992 Dependence on renal dialysis: Secondary | ICD-10-CM | POA: Diagnosis not present

## 2024-01-18 DIAGNOSIS — D631 Anemia in chronic kidney disease: Secondary | ICD-10-CM | POA: Diagnosis not present

## 2024-01-18 DIAGNOSIS — D509 Iron deficiency anemia, unspecified: Secondary | ICD-10-CM | POA: Diagnosis not present

## 2024-01-21 DIAGNOSIS — N2581 Secondary hyperparathyroidism of renal origin: Secondary | ICD-10-CM | POA: Diagnosis not present

## 2024-01-21 DIAGNOSIS — D509 Iron deficiency anemia, unspecified: Secondary | ICD-10-CM | POA: Diagnosis not present

## 2024-01-21 DIAGNOSIS — E1129 Type 2 diabetes mellitus with other diabetic kidney complication: Secondary | ICD-10-CM | POA: Diagnosis not present

## 2024-01-21 DIAGNOSIS — D631 Anemia in chronic kidney disease: Secondary | ICD-10-CM | POA: Diagnosis not present

## 2024-01-21 DIAGNOSIS — N186 End stage renal disease: Secondary | ICD-10-CM | POA: Diagnosis not present

## 2024-01-21 DIAGNOSIS — Z992 Dependence on renal dialysis: Secondary | ICD-10-CM | POA: Diagnosis not present

## 2024-01-21 NOTE — Telephone Encounter (Signed)
 Please have patient follow-up with me in about 2 weeks. Thanks!

## 2024-01-23 DIAGNOSIS — Z992 Dependence on renal dialysis: Secondary | ICD-10-CM | POA: Diagnosis not present

## 2024-01-23 DIAGNOSIS — N2581 Secondary hyperparathyroidism of renal origin: Secondary | ICD-10-CM | POA: Diagnosis not present

## 2024-01-23 DIAGNOSIS — E1129 Type 2 diabetes mellitus with other diabetic kidney complication: Secondary | ICD-10-CM | POA: Diagnosis not present

## 2024-01-23 DIAGNOSIS — N186 End stage renal disease: Secondary | ICD-10-CM | POA: Diagnosis not present

## 2024-01-23 DIAGNOSIS — D631 Anemia in chronic kidney disease: Secondary | ICD-10-CM | POA: Diagnosis not present

## 2024-01-23 DIAGNOSIS — D509 Iron deficiency anemia, unspecified: Secondary | ICD-10-CM | POA: Diagnosis not present

## 2024-01-24 ENCOUNTER — Other Ambulatory Visit: Payer: Self-pay

## 2024-01-24 ENCOUNTER — Emergency Department (HOSPITAL_COMMUNITY)
Admission: EM | Admit: 2024-01-24 | Discharge: 2024-01-24 | Disposition: A | Discharge status: Home / Self Care | Attending: Emergency Medicine | Admitting: Emergency Medicine

## 2024-01-24 ENCOUNTER — Emergency Department (HOSPITAL_COMMUNITY)

## 2024-01-24 ENCOUNTER — Encounter (HOSPITAL_COMMUNITY): Payer: Self-pay

## 2024-01-24 ENCOUNTER — Other Ambulatory Visit: Payer: Self-pay | Admitting: *Deleted

## 2024-01-24 DIAGNOSIS — C799 Secondary malignant neoplasm of unspecified site: Secondary | ICD-10-CM

## 2024-01-24 DIAGNOSIS — Z7982 Long term (current) use of aspirin: Secondary | ICD-10-CM | POA: Insufficient documentation

## 2024-01-24 DIAGNOSIS — Z853 Personal history of malignant neoplasm of breast: Secondary | ICD-10-CM | POA: Diagnosis not present

## 2024-01-24 DIAGNOSIS — R0789 Other chest pain: Secondary | ICD-10-CM | POA: Diagnosis present

## 2024-01-24 DIAGNOSIS — I10 Essential (primary) hypertension: Secondary | ICD-10-CM | POA: Insufficient documentation

## 2024-01-24 DIAGNOSIS — R590 Localized enlarged lymph nodes: Secondary | ICD-10-CM | POA: Diagnosis not present

## 2024-01-24 DIAGNOSIS — Z79899 Other long term (current) drug therapy: Secondary | ICD-10-CM | POA: Insufficient documentation

## 2024-01-24 DIAGNOSIS — R599 Enlarged lymph nodes, unspecified: Secondary | ICD-10-CM

## 2024-01-24 LAB — CBC
HCT: 33.2 % — ABNORMAL LOW (ref 36.0–46.0)
Hemoglobin: 10.2 g/dL — ABNORMAL LOW (ref 12.0–15.0)
MCH: 37.4 pg — ABNORMAL HIGH (ref 26.0–34.0)
MCHC: 30.7 g/dL (ref 30.0–36.0)
MCV: 121.6 fL — ABNORMAL HIGH (ref 80.0–100.0)
Platelets: 209 10*3/uL (ref 150–400)
RBC: 2.73 MIL/uL — ABNORMAL LOW (ref 3.87–5.11)
RDW: 15 % (ref 11.5–15.5)
WBC: 6.9 10*3/uL (ref 4.0–10.5)
nRBC: 0.3 % — ABNORMAL HIGH (ref 0.0–0.2)

## 2024-01-24 LAB — BASIC METABOLIC PANEL WITH GFR
Anion gap: 15 (ref 5–15)
BUN: 37 mg/dL — ABNORMAL HIGH (ref 8–23)
CO2: 26 mmol/L (ref 22–32)
Calcium: 8.7 mg/dL — ABNORMAL LOW (ref 8.9–10.3)
Chloride: 99 mmol/L (ref 98–111)
Creatinine, Ser: 10.7 mg/dL — ABNORMAL HIGH (ref 0.44–1.00)
GFR, Estimated: 4 mL/min — ABNORMAL LOW (ref >60)
Glucose, Bld: 97 mg/dL (ref 70–99)
Potassium: 4.8 mmol/L (ref 3.5–5.1)
Sodium: 140 mmol/L (ref 135–145)

## 2024-01-24 MED ORDER — OXYCODONE-ACETAMINOPHEN 5-325 MG PO TABS
2.0000 | ORAL_TABLET | Freq: Once | ORAL | Status: AC
  Administered 2024-01-24: 2 via ORAL
  Filled 2024-01-24: qty 2

## 2024-01-24 MED ORDER — IOHEXOL 300 MG/ML  SOLN
100.0000 mL | Freq: Once | INTRAMUSCULAR | Status: AC | PRN
Start: 2024-01-24 — End: 2024-01-24
  Administered 2024-01-24: 100 mL via INTRAVENOUS

## 2024-01-24 NOTE — ED Provider Notes (Signed)
 [place] EMERGENCY DEPARTMENT AT [facility] Provider Note   CSN: [id number] Arrival date & time: 01/24/24  4098     Patient presents with: Breast Mass   Megan [name] is a 63 y.o. female.   HPI   This patient is a 63 year old female, history of hypertension but more importantly a history of recurrent breast cancer.  She states she has had breast cancer 3 different times and has undergone bilateral mastectomies as well as radiation and chemo finishing in February.  The patient reports that she has had a couple of days of worsening left-sided tenderness in her left upper outer quadrant of the left chest wall and breast.  This is almost into the axilla axillary region.  The patient was just seen here on June 4 approximately 8 days ago because of right sided chest pain and chronic shoulder pain, today she is complaining of pain in the left axillary region.  She denies fevers or chills, nausea or vomiting, coughing or shortness of breath.  She denies any swelling of the legs.  She is on dialysis Monday Wednesday Friday and her access is in the left upper extremity.  She has not had any redness swelling or tenderness over the graft  Prior to Admission medications   Medication Sig Start Date End Date Taking? Authorizing Provider  amLODipine  (NORVASC ) 5 MG tablet Take 5 mg by mouth at bedtime.    [provider]  aspirin  EC 81 MG tablet Take 81 mg by mouth daily.    [provider]  atorvastatin  (LIPITOR) 20 MG tablet TAKE 1 TABLET(20 MG) BY MOUTH DAILY 11/13/23   Austine Lefort, MD  B Complex-C-Zn-Folic Acid  (DIALYVITE  800-ZINC  15) 0.8 MG TABS Take 1 tablet by mouth daily. 06/19/22   [provider]  cinacalcet  (SENSIPAR ) 60 MG tablet Take 60 mg by mouth daily.    [provider]  Darbepoetin Alfa  (ARANESP , ALBUMIN FREE, IJ) Darbepoetin Alfa  (Aranesp ) 07/06/23 07/04/24  [provider]  dicyclomine  (BENTYL ) 10 MG capsule Take 1  capsule (10 mg total) by mouth 2 (two) times daily as needed for up to 10 days for spasms. Patient not taking: Reported on 01/16/2024 12/31/23 01/10/24  Mansouraty, Albino Alu., MD  fluticasone  (FLONASE ) 50 MCG/ACT nasal spray Place 1 spray into both nostrils daily.    [provider]  gabapentin  (NEURONTIN ) 300 MG capsule TAKE 1 CAPSULE(300 MG) BY MOUTH AT BEDTIME 06/12/23   Austine Lefort, MD  iron  sucrose (VENOFER ) 20 MG/ML injection Inject 50 mg into the vein once a week. 02/19/23 02/18/24  [provider]  lanthanum  (FOSRENOL ) 1000 MG chewable tablet Chew 2,000-3,000 mg by mouth See admin instructions. Take 3 tablets (3000 mg) by mouth with meals and take 2 tablets (2000 mg) with snacks 10/16/19   [provider]  lidocaine -prilocaine  (EMLA ) cream APPLY TOPICALLY TO THE AFFECTED AREA 1 TIME 10/01/23   Paulett Boros, MD  meclizine  (ANTIVERT ) 25 MG tablet Take 1 tablet (25 mg total) by mouth 3 (three) times daily as needed for dizziness. 06/12/23   Austine Lefort, MD  nebivolol  (BYSTOLIC ) 10 MG tablet TAKE 1 TABLET BY MOUTH IN THE MORNING AND AT BEDTIME 11/20/23   Austine Lefort, MD  nitroGLYCERIN  (NITROSTAT ) 0.4 MG SL tablet Place 1 tablet (0.4 mg total) under the tongue every 5 (five) minutes x 3 doses as needed for chest pain (if no relief after 3rd dose, proceed to ED or call 911). 09/13/22   Armida Lander  F, MD  oxyCODONE  (ROXICODONE ) 5 MG immediate release tablet Take 1 tablet (5 mg total) by mouth every 4 (four) hours as needed. 11/29/23 11/28/24  [name], MD  pantoprazole  (PROTONIX ) 40 MG tablet Take 1 tablet (40 mg total) by mouth 2 (two) times daily before a meal. 10/08/23   Rourk, Windsor Hatcher, MD  sucralfate  (CARAFATE ) 1 g tablet Take 1 tablet (1 g total) by mouth 2 (two) times daily for 14 days. Patient taking differently: Take 1 g by mouth as needed (stomach). 12/31/23 01/16/24  Mansouraty, Albino Alu., MD    Allergies: Motrin [ibuprofen]    Review of  Systems  All other systems reviewed and are negative.   Updated Vital Signs BP (!) 144/68   Pulse 88   Temp 98.4 F (36.9 C) (Oral)   Resp 18   Ht 1.549 m (5' 1)   Wt 80.3 kg   SpO2 94%   BMI 33.44 kg/m   Physical Exam Vitals and nursing note reviewed.  Constitutional:      General: She is not in acute distress.    Appearance: She is well-developed.  HENT:     Head: Normocephalic and atraumatic.     Mouth/Throat:     Pharynx: No oropharyngeal exudate.   Eyes:     General: No scleral icterus.       Right eye: No discharge.        Left eye: No discharge.     Conjunctiva/sclera: Conjunctivae normal.     Pupils: Pupils are equal, round, and reactive to light.   Neck:     Thyroid : No thyromegaly.     Vascular: No JVD.   Cardiovascular:     Rate and Rhythm: Normal rate and regular rhythm.     Heart sounds: Normal heart sounds. No murmur heard.    No friction rub. No gallop.  Pulmonary:     Effort: Pulmonary effort is normal. No respiratory distress.     Breath sounds: Normal breath sounds. No wheezing or rales.  Abdominal:     General: Bowel sounds are normal. There is no distension.     Palpations: Abdomen is soft. There is no mass.     Tenderness: There is no abdominal tenderness.  Genitourinary:    Comments: Chaperone present, bilateral chest examined, mastectomy sites intact without any signs of drainage, there is a tender area in the left upper outer quadrant and into the axilla that appears to be a solitary lump that is tender, there is slight overlying redness of the skin.  Musculoskeletal:        General: No tenderness. Normal range of motion.     Cervical back: Normal range of motion and neck supple.  Lymphadenopathy:     Cervical: No cervical adenopathy.   Skin:    General: Skin is warm and dry.     Findings: No erythema or rash.   Neurological:     Mental Status: She is alert.     Coordination: Coordination normal.   Psychiatric:         Behavior: Behavior normal.     (all labs ordered are listed, but only abnormal results are displayed) Labs Reviewed  CBC - Abnormal; Notable for the following components:      Result Value   RBC 2.73 (*)    Hemoglobin 10.2 (*)    HCT 33.2 (*)    MCV 121.6 (*)    MCH 37.4 (*)    nRBC 0.3 (*)  All other components within normal limits  BASIC METABOLIC PANEL WITH GFR - Abnormal; Notable for the following components:   BUN 37 (*)    Creatinine, Ser 10.70 (*)    Calcium  8.7 (*)    GFR, Estimated 4 (*)    All other components within normal limits    EKG: None  Radiology: CT Chest W Contrast Result Date: 01/24/2024 CLINICAL DATA:  History of breast cancer. Left upper are chest mass/tenderness. EXAM: CT CHEST WITH CONTRAST TECHNIQUE: Multidetector CT imaging of the chest was performed during intravenous contrast administration. RADIATION DOSE REDUCTION: This exam was performed according to the departmental dose-optimization program which includes automated exposure control, adjustment of the mA and/or kV according to patient size and/or use of iterative reconstruction technique. CONTRAST:  OMNIPAQUE  IOHEXOL  300 MG/ML  SOLN COMPARISON:  CT dated 11/07/2023. FINDINGS: Cardiovascular: There is mild cardiomegaly. No pericardial effusion. Mild atherosclerotic calcification of the thoracic aorta. No aneurysmal dilatation or dissection. The origins of the great vessels of the aortic arch and the central pulmonary arteries are patent. Mediastinum/Nodes: No hilar or mediastinal adenopathy. The esophagus is grossly unremarkable. No mediastinal fluid collection. Lungs/Pleura: Right apical and right upper lobe linear scarring or post radiation changes. A 7 mm nodule in the right upper lobe similar to prior CT. A 5 x 11 mm nodular area in the anterior right middle lobe (67/3) is new since the prior CT and likely an area of nodular scarring. A 5 mm ground-glass nodule in the lateral right lower lobe is  similar to prior CT. Additional 3 mm right lung base subpleural nodule (89/3) similar to prior CT. No consolidative changes. There is no pleural effusion or pneumothorax. The central airways are patent. There are ill-defined faint areas of ground-glass density in the subpleural left upper lobe which may represent atelectasis. Atypical pneumonia is not excluded. The central airways are patent. Upper Abdomen: No acute abnormality. Musculoskeletal: Degenerative changes of the spine. No acute osseous pathology. Partially visualized left axillary adenopathy, new since the prior CT. A left chest wall subpectoral lymph node measures 10 mm short axis, new since the prior CT. Right axillary node dissection clips. Left supraclavicular rounded lymph node measures 8 mm (18/2). Postsurgical changes of right mastectomy similar to prior CT. Similar appearance of soft tissue thickening of the right pectoral musculature. IMPRESSION: 1. New left axillary and subpectoral adenopathy. 2. Interval development of probable nodular scarring in the anterior right middle lobe. Additional nodules similar to prior CT 3.  Aortic Atherosclerosis (ICD10-I70.0). Electronically Signed   By: Angus Bark M.D.   On: 01/24/2024 11:35     Procedures   Medications Ordered in the ED  iohexol  (OMNIPAQUE ) 300 MG/ML solution 100 mL (100 mLs Intravenous Contrast Given 01/24/24 1051)                                    Medical Decision Making Amount and/or Complexity of Data Reviewed Labs: ordered. Radiology: ordered.  Risk Prescription drug management.    This patient presents to the ED for concern of exam could be consistent with either infection or recurrent tumor differential diagnosis includes infection, tumor, lymphadenopathy    Additional history obtained   Additional history obtained from Electronic Medical Record External records from outside source obtained and reviewed including medical record including oncology  notes   Lab Tests:  I Ordered, and personally interpreted labs.  The pertinent results include:  labs unremarkable.   Imaging Studies ordered:  I ordered imaging studies including CT chest  I independently visualized and interpreted imaging which showed LAD of the subpectoral and axillary region. I agree with the radiologist interpretation    Problem List / ED Course:  Discussed the case with oncology, they will see the patient in follow-up and discussed with the patient, she understands that this is likely metastatic cancer   Social Determinants of Health:  Metastatic cancer        Final diagnoses:  Adenopathy    ED Discharge Orders     None          Early Glisson, MD 01/24/24 1141

## 2024-01-24 NOTE — ED Triage Notes (Signed)
 Pt arrived via POV from home c/o new lump on her left breast. EDP at bedside during Triage. Pt has LUE Retstriction. Pt is MWF Dialysis patient. Pt reports pain in left breast since Sunday.

## 2024-01-24 NOTE — Discharge Instructions (Addendum)
 The CT scan shows that you actually have some worsening lymph node swelling that has not been present before in that same area where you are having tenderness.  I spoke with Dr. Cheree Cords and he will have the office follow-up with you to get you in sooner.  There did not seem to be any signs of other abnormalities such as abscesses.  Please make sure that you call the office today to arrange the follow-up appointment.  This is probably recurrent cancer but Dr. Katragadda can take you through the next steps.

## 2024-01-24 NOTE — ED Notes (Signed)
 Patient transported to CT

## 2024-01-25 DIAGNOSIS — D631 Anemia in chronic kidney disease: Secondary | ICD-10-CM | POA: Diagnosis not present

## 2024-01-25 DIAGNOSIS — N186 End stage renal disease: Secondary | ICD-10-CM | POA: Diagnosis not present

## 2024-01-25 DIAGNOSIS — Z992 Dependence on renal dialysis: Secondary | ICD-10-CM | POA: Diagnosis not present

## 2024-01-25 DIAGNOSIS — D509 Iron deficiency anemia, unspecified: Secondary | ICD-10-CM | POA: Diagnosis not present

## 2024-01-25 DIAGNOSIS — N2581 Secondary hyperparathyroidism of renal origin: Secondary | ICD-10-CM | POA: Diagnosis not present

## 2024-01-25 DIAGNOSIS — E1129 Type 2 diabetes mellitus with other diabetic kidney complication: Secondary | ICD-10-CM | POA: Diagnosis not present

## 2024-01-25 NOTE — Progress Notes (Unsigned)
 Office Note     CC:  follow up Requesting Provider:  Charley Constable, MD  HPI: Megan [name] is a 63 y.o. 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  She has a left radiocephalic fistula which was placed in [facility]  many years ago.  She has chronic edema in this arm and has been seen in the past by our office for evaluation of paresthesias in the left hand.  She has a surgical history of double mastectomy.  She denies needle hole bleeding after her treatment.  AV fistula has been performing well and she has been able to complete treatment without issue.  She is here today with her husband.   Past Medical History:  Diagnosis Date   (HFpEF) heart failure with preserved ejection fraction (HCC)    a. 01/2019 Echo: EF 55-60%, mild conc LVH. DD.  Torn MV chordae.   Anemia    Atypical chest pain    a. 08/2018 MV: EF 59%, no ischemia; b. 02/2019 Cath: nonobs dzs.   Blood transfusion without reported diagnosis    Breast cancer (HCC) 10/12/2020   Cataract    ESRD (end stage renal disease) on dialysis [facility])    a. HD T, T, S   Essential hypertension, benign    GERD (gastroesophageal reflux disease)    Headache    Hemorrhoids    Mixed hyperlipidemia    Morbid obesity (HCC)    Non-obstructive CAD (coronary artery disease)    a. 02/2019 CathL LM nl, LAD 44m, LCX nl, RCA 25p, EF 55-65%.   PONV (postoperative nausea and vomiting)    Renal insufficiency    S/P colonoscopy 08/2009   Dr. Nickey Barn: sessile polyp (benign lymphoid), large hemorrhoids, repeat 5-10 years   Temporal arteritis (HCC)    Type 2 diabetes mellitus (HCC)    Wears glasses     Past Surgical History:  Procedure Laterality Date   ABDOMINAL HYSTERECTOMY     APPENDECTOMY     ARTERY BIOPSY N/A 05/09/2018   Procedure: RIGHT TEMPORAL ARTERY BIOPSY;  Surgeon: Jerryl Morin, MD;  Location: [facility] OR;  Service: General;  Laterality: N/A;   BIOPSY  10/04/2023   Procedure: BIOPSY;  Surgeon: Suzette Espy, MD;  [facility];  Service: Endoscopy;;   BREAST BIOPSY Right 06/15/2020   Procedure: RIGHT BREAST BIOPSY;  Surgeon: [name], MD;  Location: [facility] ORS;  Service: General;  Laterality: Right;   CATARACT EXTRACTION W/PHACO Left 02/09/2017   Procedure: CATARACT EXTRACTION PHACO AND INTRAOCULAR LENS PLACEMENT LEFT EYE;  Surgeon: Anner Kill, MD;  Location: [facility] OR[facility];  Service: Ophthalmology;  Laterality: Left;  CDE: 4.89   CATARACT EXTRACTION W/PHACO Right 06/04/2017   Procedure: CATARACT EXTRACTION PHACO AND INTRAOCULAR LENS PLACEMENT (IOC);  Surgeon: Anner Kill, MD;  Location: [facility];  Service: Ophthalmology;  Laterality: Right;  CDE: 4.12   CHOLECYSTECTOMY  09/29/2011   Procedure: LAPAROSCOPIC CHOLECYSTECTOMY;  Surgeon: Beau Bound, MD;  Location: [facility];  Service: General;  Laterality: N/A;   COLONOSCOPY  08/2009   Dr. Nickey Barn: sessile polyp (benign lymphoid), large hemorrhoids, repeat 5-10 years   COLONOSCOPY N/A 06/12/2016   prominent hemorrhoids   COLONOSCOPY WITH PROPOFOL  N/A 06/16/2021   Procedure: COLONOSCOPY WITH PROPOFOL ;  Surgeon: Suzette Espy, MD;  [facility];  Service: Endoscopy;  Laterality: N/A;  9:30am (dialysis pt)   ESOPHAGOGASTRODUODENOSCOPY  09/05/2011   ZOX:WRUEA hiatal hernia; remainder of exam normal. No explanation for patient's abdominal pain with  today's examination   ESOPHAGOGASTRODUODENOSCOPY N/A 12/17/2013   Dr. Riley Cheadle: gastric erythema, erosion, mild chronic inflammation on path    ESOPHAGOGASTRODUODENOSCOPY N/A 12/31/2023   Procedure: EGD (ESOPHAGOGASTRODUODENOSCOPY);  Surgeon: Normie Becton., MD;  Location: [facility];  Service: Gastroenterology;  Laterality: N/A;   ESOPHAGOGASTRODUODENOSCOPY (EGD) WITH PROPOFOL  N/A 06/16/2021   Procedure: ESOPHAGOGASTRODUODENOSCOPY (EGD) WITH PROPOFOL ;  Surgeon: Suzette Espy, MD;  [facility];  Service: Endoscopy;  Laterality: N/A;   ESOPHAGOGASTRODUODENOSCOPY (EGD)  WITH PROPOFOL  N/A 10/04/2023   Procedure: ESOPHAGOGASTRODUODENOSCOPY (EGD) WITH PROPOFOL ;  Surgeon: Suzette Espy, MD;  [facility];  Service: Endoscopy;  Laterality: N/A;  200pm, ok rm 1-2, pt knows to arrive at 6:45   EUS N/A 12/31/2023   Procedure: ULTRASOUND, UPPER GI TRACT, ENDOSCOPIC;  Surgeon: Brice Campi Albino Alu., MD;  Location: [facility];  Service: Gastroenterology;  Laterality: N/A;   EXCISION OF BREAST BIOPSY Right 10/12/2020   Procedure: EXCISION OF RIGHT BREAST BIOPSY;  Surgeon: [name], MD;  Location: [facility];  Service: General;  Laterality: Right;   IR DIALY SHUNT INTRO NEEDLE/INTRACATH INITIAL W/IMG LEFT Left 07/17/2023   LAPAROSCOPIC APPENDECTOMY  09/29/2011   Procedure: APPENDECTOMY LAPAROSCOPIC;  Surgeon: Beau Bound, MD;  Location: [facility];  Service: General;;  incidental appendectomy   LEFT HEART CATH AND CORONARY ANGIOGRAPHY N/A 02/28/2019   Procedure: LEFT HEART CATH AND CORONARY ANGIOGRAPHY;  Surgeon: Lucendia Rusk, MD;  Location: [facility] INVASIVE CV LAB;  Service: Cardiovascular;  Laterality: N/A;   MASS EXCISION Right 01/18/2023   Procedure: EXCISION MASS, RIGHT CHEST S/P MASTECTOMY;  Surgeon: [name], MD;  Location: [facility];  Service: General;  Laterality: Right;   MASTECTOMY MODIFIED RADICAL Right 02/18/2020   Procedure: MASTECTOMY MODIFIED RADICAL;  Surgeon: [name], MD;  Location: [facility];  Service: General;  Laterality: Right;   MASTECTOMY, PARTIAL Right 07/13/2020   Procedure: RIGHT PARTIAL MASTECTOMY;  Surgeon: [name], MD;  Location: [facility];  Service: General;  Laterality: Right;   PARTIAL MASTECTOMY WITH NEEDLE LOCALIZATION AND AXILLARY SENTINEL LYMPH NODE BX Right 09/18/2018   Procedure: RIGHT PARTIAL MASTECTOMY AFTER NEEDLE LOCALIZATION, SENTINEL LYMPH NODE BIOPSY RIGHT AXILLA;  Surgeon: [name], MD;  Location: [facility];  Service: General;  Laterality: Right;   POLYPECTOMY  06/16/2021   Procedure: POLYPECTOMY;   Surgeon: Suzette Espy, MD;  [facility];  Service: Endoscopy;;   PORT-A-CATH REMOVAL Right 11/29/2023   Procedure: REMOVAL PORT-A-CATH;  Surgeon: [name], MD;  Location: [facility];  Service: General;  Laterality: Right;  MINOR PROCEDURE ROOM   PORTACATH PLACEMENT Right 06/07/2023   Procedure: INSERTION PORT-A-CATH, RIGHT  (DIALYSIS ACCESS ON LEFT);  Surgeon: [name], MD;  Location: [facility];  Service: General;  Laterality: Right;   SIMPLE MASTECTOMY WITH AXILLARY SENTINEL NODE BIOPSY Left 06/15/2020   Procedure: LEFT SIMPLE MASTECTOMY;  Surgeon: [name], MD;  Location: [facility];  Service: General;  Laterality: Left;    Social History   Socioeconomic History   Marital status: Married    Spouse name: Not on file   Number of children: Not on file   Years of education: Not on file   Highest education level: 12th grade  Occupational History   Occupation: Systems developer: [employer]  Tobacco Use   Smoking status: Never    Passive exposure: Never   Smokeless tobacco: Never  Vaping Use   Vaping status: Never Used  Substance and Sexual Activity  Alcohol use: No   Drug use: No   Sexual activity: Yes    Birth control/protection: Surgical  Other Topics Concern   Not on file  Social History Narrative   Works at [employer] in [place].    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   Also has to get items from high shelves-causes achy pain in shoulder area      Married.   Children are grown, out of house.   Social Drivers of Corporate investment banker Strain: Low Risk  (09/20/2023)   Overall Financial Resource Strain (CARDIA)    Difficulty of Paying Living Expenses: Not hard at all  Food Insecurity: No Food Insecurity (09/20/2023)   Hunger Vital Sign    Worried About Running Out of Food in the Last Year: Never true    Ran Out of Food in the Last Year: Never true  Transportation Needs: No Transportation Needs (09/20/2023)   PRAPARE -  Administrator, Civil Service (Medical): No    Lack of Transportation (Non-Medical): No  Physical Activity: Inactive (09/20/2023)   Exercise Vital Sign    Days of Exercise per Week: 0 days    Minutes of Exercise per Session: 0 min  Stress: No Stress Concern Present (09/20/2023)   [employer] of Occupational Health - Occupational Stress Questionnaire    Feeling of Stress : Not at all  Social Connections: Moderately Integrated (09/20/2023)   Social Connection and Isolation Panel    Frequency of Communication with Friends and Family: More than three times a week    Frequency of Social Gatherings with Friends and Family: Three times a week    Attends Religious Services: More than 4 times per year    Active Member of Clubs or Organizations: No    Attends Banker Meetings: Never    Marital Status: Married  Catering manager Violence: Not At Risk (09/20/2023)   Humiliation, Afraid, Rape, and Kick questionnaire    Fear of Current or Ex-Partner: No    Emotionally Abused: No    Physically Abused: No    Sexually Abused: No    Family History  Problem Relation Age of Onset   Hypertension Mother    Coronary artery disease Mother    Diabetes Mother    Heart attack Father    Hypertension Sister    Coronary artery disease Sister    Hypertension Brother    Colon cancer Brother    Heart attack Maternal Grandmother    Heart attack Maternal Grandfather    Heart attack Paternal Grandmother    Heart attack Paternal Grandfather    Hypertension Son    Heart attack Maternal Aunt    Hypertension Maternal Aunt    Diabetes Maternal Aunt    Heart attack Maternal Uncle    Hypertension Maternal Uncle    Diabetes Maternal Uncle    Heart attack Paternal Aunt    Hypertension Paternal Aunt    Diabetes Paternal Aunt    Heart attack Paternal Uncle    Hypertension Paternal Uncle    Diabetes Paternal Uncle     Current Outpatient Medications  Medication Sig Dispense Refill    amLODipine  (NORVASC ) 5 MG tablet Take 5 mg by mouth at bedtime.     aspirin  EC 81 MG tablet Take 81 mg by mouth daily.     atorvastatin  (LIPITOR) 20 MG tablet TAKE 1 TABLET(20 MG) BY MOUTH DAILY 90 tablet 0   B Complex-C-Zn-Folic Acid  (DIALYVITE   800-ZINC  15) 0.8 MG TABS Take 1 tablet by mouth daily.     cinacalcet  (SENSIPAR ) 60 MG tablet Take 60 mg by mouth daily.     Darbepoetin Alfa  (ARANESP , ALBUMIN FREE, IJ) Darbepoetin Alfa  (Aranesp )     fluticasone  (FLONASE ) 50 MCG/ACT nasal spray Place 1 spray into both nostrils daily.     gabapentin  (NEURONTIN ) 300 MG capsule TAKE 1 CAPSULE(300 MG) BY MOUTH AT BEDTIME 90 capsule 3   lidocaine -prilocaine  (EMLA ) cream APPLY TOPICALLY TO THE AFFECTED AREA 1 TIME 30 g 3   meclizine  (ANTIVERT ) 25 MG tablet Take 1 tablet (25 mg total) by mouth 3 (three) times daily as needed for dizziness. 30 tablet 1   nebivolol  (BYSTOLIC ) 10 MG tablet TAKE 1 TABLET BY MOUTH IN THE MORNING AND AT BEDTIME 180 tablet 0   nitroGLYCERIN  (NITROSTAT ) 0.4 MG SL tablet Place 1 tablet (0.4 mg total) under the tongue every 5 (five) minutes x 3 doses as needed for chest pain (if no relief after 3rd dose, proceed to ED or call 911). 25 tablet 3   oxyCODONE  (ROXICODONE ) 5 MG immediate release tablet Take 1 tablet (5 mg total) by mouth every 4 (four) hours as needed. 20 tablet 0   oxyCODONE -acetaminophen  (PERCOCET/ROXICET) 5-325 MG tablet Take 1-2 tablets by mouth every 4 (four) hours as needed for severe pain (pain score 7-10). 60 tablet 0   pantoprazole  (PROTONIX ) 40 MG tablet Take 1 tablet (40 mg total) by mouth 2 (two) times daily before a meal. 60 tablet 11   iron  sucrose (VENOFER ) 20 MG/ML injection Inject 50 mg into the vein once a week.     lanthanum  (FOSRENOL ) 1000 MG chewable tablet Chew 2,000-3,000 mg by mouth See admin instructions. Take 3 tablets (3000 mg) by mouth with meals and take 2 tablets (2000 mg) with snacks     No current facility-administered medications for this visit.     Allergies  Allergen Reactions   Motrin [Ibuprofen] Other (See Comments)    ESRD      REVIEW OF SYSTEMS:   [X]  denotes positive finding, [ ]  denotes negative finding Cardiac  Comments:  Chest pain or chest pressure:    Shortness of breath upon exertion:    Short of breath when lying flat:    Irregular heart rhythm:        Vascular    Pain in calf, thigh, or hip brought on by ambulation:    Pain in feet at night that wakes you up from your sleep:     Blood clot in your veins:    Leg swelling:         Pulmonary    Oxygen  at home:    Productive cough:     Wheezing:         Neurologic    Sudden weakness in arms or legs:     Sudden numbness in arms or legs:     Sudden onset of difficulty speaking or slurred speech:    Temporary loss of vision in one eye:     Problems with dizziness:         Gastrointestinal    Blood in stool:     Vomited blood:         Genitourinary    Burning when urinating:     Blood in urine:        Psychiatric    Major depression:         Hematologic    Bleeding problems:    Problems  with blood clotting too easily:        Skin    Rashes or ulcers:        Constitutional    Fever or chills:      PHYSICAL EXAMINATION:  Vitals:   01/29/24 0825  BP: 126/76  Pulse: 91  SpO2: 94%  Weight: 183 lb (83 kg)  Height: 5' 1.5 (1.562 m)    General:  WDWN in NAD; vital signs documented above Gait: Not observed HENT: WNL, normocephalic Pulmonary: normal non-labored breathing Cardiac: regular HR Abdomen: soft, NT, no masses Skin: without rashes Vascular Exam/Pulses: palpable L radial; palpable thrill in forearm; more pulsatile flow near the [facility] fossa Extremities: Aneurysmal degeneration of left radiocephalic fistula without overlying scabs or ulcerations; hypopigmentation but mobile skin overlying fistula Musculoskeletal: no muscle wasting or atrophy  Neurologic: A&O X 3 Psychiatric:  The pt has Normal  affect.     ASSESSMENT/PLAN:: 63 y.o. 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 Minteer is a 63 year old female with a left radiocephalic fistula.  She was referred back to our office for evaluation of aneurysmal areas.  Fistula has been functioning appropriately during her treatments.  She denies any local bleeding and enlarging aneurysms.  On exam aneurysmal areas are without overlying ulcers or scabs.  She does have hypopigmentation however skin is mobile overlying aneurysmal areas.  No indication for surgical revision currently.  She will follow-up on an as-needed basis.   Cordie Deters, PA-C [facility] [phone]

## 2024-01-28 ENCOUNTER — Ambulatory Visit (INDEPENDENT_AMBULATORY_CARE_PROVIDER_SITE_OTHER): Admitting: Family Medicine

## 2024-01-28 ENCOUNTER — Inpatient Hospital Stay: Admitting: Family Medicine

## 2024-01-28 ENCOUNTER — Encounter: Payer: Self-pay | Admitting: Family Medicine

## 2024-01-28 VITALS — BP 130/70 | HR 53 | Ht 61.0 in | Wt 183.0 lb

## 2024-01-28 DIAGNOSIS — Z992 Dependence on renal dialysis: Secondary | ICD-10-CM | POA: Diagnosis not present

## 2024-01-28 DIAGNOSIS — R591 Generalized enlarged lymph nodes: Secondary | ICD-10-CM | POA: Diagnosis not present

## 2024-01-28 DIAGNOSIS — E1129 Type 2 diabetes mellitus with other diabetic kidney complication: Secondary | ICD-10-CM | POA: Diagnosis not present

## 2024-01-28 DIAGNOSIS — N2581 Secondary hyperparathyroidism of renal origin: Secondary | ICD-10-CM | POA: Diagnosis not present

## 2024-01-28 DIAGNOSIS — N186 End stage renal disease: Secondary | ICD-10-CM | POA: Diagnosis not present

## 2024-01-28 DIAGNOSIS — D509 Iron deficiency anemia, unspecified: Secondary | ICD-10-CM | POA: Diagnosis not present

## 2024-01-28 DIAGNOSIS — D631 Anemia in chronic kidney disease: Secondary | ICD-10-CM | POA: Diagnosis not present

## 2024-01-28 MED ORDER — OXYCODONE-ACETAMINOPHEN 5-325 MG PO TABS: 1.0000 | ORAL_TABLET | ORAL | 0 refills | Status: AC | PRN

## 2024-01-28 NOTE — Progress Notes (Signed)
 Subjective:    Patient ID: Megan [name], female    DOB: 1961/02/16, 63 y.o.   MRN: [id number]  Patient recently had to go to the emergency room with chest pain.  CT scan in the emergency room showed a 10 mm left subpectoral lymph node that was not present on her previous CAT scan in March.  She also had an 8 mm left supraclavicular lymph node enlargement.  There was partially observed left axillary lymphadenopathy although no size declaration was made.  Of note, the patient has an AV fistula in her left forearm.  There is no evidence of cellulitis in the left forearm or in the left arm however she does have tenderness to palpation to a small fluid accumulation in the left axilla.  This fluid accumulation does not appear to be an abscess.  Rather appears that she has some mild lymphedema in the left chest wall that is  adjacent to the left axilla Past Medical History:  Diagnosis Date   (HFpEF) heart failure with preserved ejection fraction (HCC)    a. 01/2019 Echo: EF 55-60%, mild conc LVH. DD.  Torn MV chordae.   Anemia    Atypical chest pain    a. 08/2018 MV: EF 59%, no ischemia; b. 02/2019 Cath: nonobs dzs.   Blood transfusion without reported diagnosis    Breast cancer (HCC) 10/12/2020   Cataract    ESRD (end stage renal disease) on dialysis [facility])    a. HD T, T, S   Essential hypertension, benign    GERD (gastroesophageal reflux disease)    Headache    Hemorrhoids    Mixed hyperlipidemia    Morbid obesity (HCC)    Non-obstructive CAD (coronary artery disease)    a. 02/2019 CathL LM nl, LAD 77m, LCX nl, RCA 25p, EF 55-65%.   PONV (postoperative nausea and vomiting)    Renal insufficiency    S/P colonoscopy 08/2009   Dr. Nickey Barn: sessile polyp (benign lymphoid), large hemorrhoids, repeat 5-10 years   Temporal arteritis (HCC)    Type 2 diabetes mellitus (HCC)    Wears glasses    Past Surgical History:  Procedure Laterality Date   ABDOMINAL HYSTERECTOMY     APPENDECTOMY      ARTERY BIOPSY N/A 05/09/2018   Procedure: RIGHT TEMPORAL ARTERY BIOPSY;  Surgeon: Jerryl Morin, MD;  Location: [facility] OR;  Service: General;  Laterality: N/A;   BIOPSY  10/04/2023   Procedure: BIOPSY;  Surgeon: Suzette Espy, MD;  Location: [facility] SUITE;  Service: Endoscopy;;   BREAST BIOPSY Right 06/15/2020   Procedure: RIGHT BREAST BIOPSY;  Surgeon: [name], MD;  Location: [facility] ORS;  Service: General;  Laterality: Right;   CATARACT EXTRACTION W/PHACO Left 02/09/2017   Procedure: CATARACT EXTRACTION PHACO AND INTRAOCULAR LENS PLACEMENT LEFT EYE;  Surgeon: Anner Kill, MD;  Location: [facility] ORS;  Service: Ophthalmology;  Laterality: Left;  CDE: 4.89   CATARACT EXTRACTION W/PHACO Right 06/04/2017   Procedure: CATARACT EXTRACTION PHACO AND INTRAOCULAR LENS PLACEMENT (IOC);  Surgeon: Anner Kill, MD;  Location: [facility] ORS;  Service: Ophthalmology;  Laterality: Right;  CDE: 4.12   CHOLECYSTECTOMY  09/29/2011   Procedure: LAPAROSCOPIC CHOLECYSTECTOMY;  Surgeon: Beau Bound, MD;  Location: [facility] ORS;  Service: General;  Laterality: N/A;   COLONOSCOPY  08/2009   Dr. Nickey Barn: sessile polyp (benign lymphoid), large hemorrhoids, repeat 5-10 years   COLONOSCOPY N/A 06/12/2016   prominent hemorrhoids   COLONOSCOPY WITH PROPOFOL  N/A 06/16/2021   Procedure: COLONOSCOPY WITH PROPOFOL ;  Surgeon: Suzette Espy, MD;  [facility];  Service: Endoscopy;  Laterality: N/A;  9:30am (dialysis pt)   ESOPHAGOGASTRODUODENOSCOPY  09/05/2011   GNF:AOZHY hiatal hernia; remainder of exam normal. No explanation for patient's abdominal pain with today's examination   ESOPHAGOGASTRODUODENOSCOPY N/A 12/17/2013   Dr. Riley Cheadle: gastric erythema, erosion, mild chronic inflammation on path    ESOPHAGOGASTRODUODENOSCOPY N/A 12/31/2023   Procedure: EGD (ESOPHAGOGASTRODUODENOSCOPY);  Surgeon: Normie Becton., MD;  Location: [facility];  Service: Gastroenterology;  Laterality: N/A;   ESOPHAGOGASTRODUODENOSCOPY (EGD) WITH  PROPOFOL  N/A 06/16/2021   Procedure: ESOPHAGOGASTRODUODENOSCOPY (EGD) WITH PROPOFOL ;  Surgeon: Suzette Espy, MD;  [facility];  Service: Endoscopy;  Laterality: N/A;   ESOPHAGOGASTRODUODENOSCOPY (EGD) WITH PROPOFOL  N/A 10/04/2023   Procedure: ESOPHAGOGASTRODUODENOSCOPY (EGD) WITH PROPOFOL ;  Surgeon: Suzette Espy, MD;  [facility];  Service: Endoscopy;  Laterality: N/A;  200pm, ok rm 1-2, pt knows to arrive at 6:45   EUS N/A 12/31/2023   Procedure: ULTRASOUND, UPPER GI TRACT, ENDOSCOPIC;  Surgeon: Brice Campi Albino Alu., MD;  Location: [facility];  Service: Gastroenterology;  Laterality: N/A;   EXCISION OF BREAST BIOPSY Right 10/12/2020   Procedure: EXCISION OF RIGHT BREAST BIOPSY;  Surgeon: [name], MD;  Location: [facility];  Service: General;  Laterality: Right;   IR DIALY SHUNT INTRO NEEDLE/INTRACATH INITIAL W/IMG LEFT Left 07/17/2023   LAPAROSCOPIC APPENDECTOMY  09/29/2011   Procedure: APPENDECTOMY LAPAROSCOPIC;  Surgeon: Beau Bound, MD;  Location: [facility];  Service: General;;  incidental appendectomy   LEFT HEART CATH AND CORONARY ANGIOGRAPHY N/A 02/28/2019   Procedure: LEFT HEART CATH AND CORONARY ANGIOGRAPHY;  Surgeon: Lucendia Rusk, MD;  Location: [facility] INVASIVE CV LAB;  Service: Cardiovascular;  Laterality: N/A;   MASS EXCISION Right 01/18/2023   Procedure: EXCISION MASS, RIGHT CHEST S/P MASTECTOMY;  Surgeon: [name], MD;  Location: [facility];  Service: General;  Laterality: Right;   MASTECTOMY MODIFIED RADICAL Right 02/18/2020   Procedure: MASTECTOMY MODIFIED RADICAL;  Surgeon: [name], MD;  Location: [facility];  Service: General;  Laterality: Right;   MASTECTOMY, PARTIAL Right 07/13/2020   Procedure: RIGHT PARTIAL MASTECTOMY;  Surgeon: [name], MD;  Location: [facility];  Service: General;  Laterality: Right;   PARTIAL MASTECTOMY WITH NEEDLE LOCALIZATION AND AXILLARY SENTINEL LYMPH NODE BX Right 09/18/2018   Procedure: RIGHT PARTIAL  MASTECTOMY AFTER NEEDLE LOCALIZATION, SENTINEL LYMPH NODE BIOPSY RIGHT AXILLA;  Surgeon: [name], MD;  Location: [facility];  Service: General;  Laterality: Right;   POLYPECTOMY  06/16/2021   Procedure: POLYPECTOMY;  Surgeon: Suzette Espy, MD;  [facility];  Service: Endoscopy;;   PORT-A-CATH REMOVAL Right 11/29/2023   Procedure: REMOVAL PORT-A-CATH;  Surgeon: [name], MD;  Location: [facility];  Service: General;  Laterality: Right;  MINOR PROCEDURE ROOM   PORTACATH PLACEMENT Right 06/07/2023   Procedure: INSERTION PORT-A-CATH, RIGHT  (DIALYSIS ACCESS ON LEFT);  Surgeon: [name], MD;  Location: [facility];  Service: General;  Laterality: Right;   SIMPLE MASTECTOMY WITH AXILLARY SENTINEL NODE BIOPSY Left 06/15/2020   Procedure: LEFT SIMPLE MASTECTOMY;  Surgeon: [name], MD;  Location: [facility];  Service: General;  Laterality: Left;   Current Outpatient Medications on File Prior to Visit  Medication Sig Dispense Refill   amLODipine  (NORVASC ) 5 MG tablet Take 5 mg by mouth at bedtime.     aspirin  EC 81 MG tablet Take 81 mg by mouth daily.     atorvastatin  (LIPITOR) 20 MG tablet  TAKE 1 TABLET(20 MG) BY MOUTH DAILY 90 tablet 0   B Complex-C-Zn-Folic Acid  (DIALYVITE  800-ZINC  15) 0.8 MG TABS Take 1 tablet by mouth daily.     cinacalcet  (SENSIPAR ) 60 MG tablet Take 60 mg by mouth daily.     Darbepoetin Alfa  (ARANESP , ALBUMIN FREE, IJ) Darbepoetin Alfa  (Aranesp )     fluticasone  (FLONASE ) 50 MCG/ACT nasal spray Place 1 spray into both nostrils daily.     gabapentin  (NEURONTIN ) 300 MG capsule TAKE 1 CAPSULE(300 MG) BY MOUTH AT BEDTIME 90 capsule 3   iron  sucrose (VENOFER ) 20 MG/ML injection Inject 50 mg into the vein once a week.     lanthanum  (FOSRENOL ) 1000 MG chewable tablet Chew 2,000-3,000 mg by mouth See admin instructions. Take 3 tablets (3000 mg) by mouth with meals and take 2 tablets (2000 mg) with snacks     lidocaine -prilocaine  (EMLA ) cream APPLY TOPICALLY TO THE  AFFECTED AREA 1 TIME 30 g 3   meclizine  (ANTIVERT ) 25 MG tablet Take 1 tablet (25 mg total) by mouth 3 (three) times daily as needed for dizziness. 30 tablet 1   nebivolol  (BYSTOLIC ) 10 MG tablet TAKE 1 TABLET BY MOUTH IN THE MORNING AND AT BEDTIME 180 tablet 0   nitroGLYCERIN  (NITROSTAT ) 0.4 MG SL tablet Place 1 tablet (0.4 mg total) under the tongue every 5 (five) minutes x 3 doses as needed for chest pain (if no relief after 3rd dose, proceed to ED or call 911). 25 tablet 3   oxyCODONE  (ROXICODONE ) 5 MG immediate release tablet Take 1 tablet (5 mg total) by mouth every 4 (four) hours as needed. 20 tablet 0   pantoprazole  (PROTONIX ) 40 MG tablet Take 1 tablet (40 mg total) by mouth 2 (two) times daily before a meal. 60 tablet 11   No current facility-administered medications on file prior to visit.   Allergies  Allergen Reactions   Motrin [Ibuprofen] Other (See Comments)    ESRD    Social History   Socioeconomic History   Marital status: Married    Spouse name: Not on file   Number of children: Not on file   Years of education: Not on file   Highest education level: 12th grade  Occupational History   Occupation: Systems developer: [employer]  Tobacco Use   Smoking status: Never    Passive exposure: Never   Smokeless tobacco: Never  Vaping Use   Vaping status: Never Used  Substance and Sexual Activity   Alcohol use: No   Drug use: No   Sexual activity: Yes    Birth control/protection: Surgical  Other Topics Concern   Not on file  Social History Narrative   Works at [employer] in [place].    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   Also has to get items from high shelves-causes achy pain in shoulder area      Married.   Children are grown, out of house.   Social Drivers of Corporate investment banker Strain: Low Risk  (09/20/2023)   Overall Financial Resource Strain (CARDIA)    Difficulty of Paying Living Expenses: Not hard at all  Food  Insecurity: No Food Insecurity (09/20/2023)   Hunger Vital Sign    Worried About Running Out of Food in the Last Year: Never true    Ran Out of Food in the Last Year: Never true  Transportation Needs: No Transportation Needs (09/20/2023)   PRAPARE - Transportation  Lack of Transportation (Medical): No    Lack of Transportation (Non-Medical): No  Physical Activity: Inactive (09/20/2023)   Exercise Vital Sign    Days of Exercise per Week: 0 days    Minutes of Exercise per Session: 0 min  Stress: No Stress Concern Present (09/20/2023)   [employer] of Occupational Health - Occupational Stress Questionnaire    Feeling of Stress : Not at all  Social Connections: Moderately Integrated (09/20/2023)   Social Connection and Isolation Panel    Frequency of Communication with Friends and Family: More than three times a week    Frequency of Social Gatherings with Friends and Family: Three times a week    Attends Religious Services: More than 4 times per year    Active Member of Clubs or Organizations: No    Attends Banker Meetings: Never    Marital Status: Married  Catering manager Violence: Not At Risk (09/20/2023)   Humiliation, Afraid, Rape, and Kick questionnaire    Fear of Current or Ex-Partner: No    Emotionally Abused: No    Physically Abused: No    Sexually Abused: No    Review of Systems  All other systems reviewed and are negative.      Objective:   Physical Exam Vitals reviewed.  Constitutional:      General: She is not in acute distress.    Appearance: Normal appearance. She is normal weight.   Cardiovascular:     Rate and Rhythm: Normal rate and regular rhythm.     Pulses: Normal pulses.     Heart sounds: Murmur (thrill from AV fistual heard as murmur) heard.  Pulmonary:     Effort: Pulmonary effort is normal. No respiratory distress.     Breath sounds: Normal breath sounds. No stridor. No wheezing, rhonchi or rales.  Chest:     Chest wall: No tenderness.     Musculoskeletal:     Right lower leg: No edema.     Left lower leg: No edema.     Right foot: Normal range of motion. No deformity.  Feet:     Right foot:     Skin integrity: No ulcer, blister, skin breakdown, erythema, warmth or callus.     Left foot:     Skin integrity: No ulcer, blister, skin breakdown, erythema, warmth or callus.   Neurological:     Mental Status: She is alert.           Assessment & Plan:  Lymphadenopathy Obviously patient is concerned about metastatic breast cancer.  She has a PET scan scheduled for Thursday.  We discussed that lymph nodes could also be reactive however I do not see any evidence of arm infection today.  This is the patient's arm that is her dialysis access.  I truly hope and pray that this is reactive lymphadenopathy.  However patient complains of significant pain in her left chest wall and in her left axilla.  She is tender to palpation in the left chest wall and the left axilla.  Therefore I will give the patient Percocet 5/325 1-2 every 6 hours as needed for pain.  I will give the patient 6 tablets while we await the results of the PET scan

## 2024-01-29 ENCOUNTER — Ambulatory Visit (INDEPENDENT_AMBULATORY_CARE_PROVIDER_SITE_OTHER): Admitting: Physician Assistant

## 2024-01-29 VITALS — BP 126/76 | HR 91 | Ht 61.5 in | Wt 183.0 lb

## 2024-01-29 DIAGNOSIS — Z992 Dependence on renal dialysis: Secondary | ICD-10-CM

## 2024-01-29 DIAGNOSIS — N186 End stage renal disease: Secondary | ICD-10-CM

## 2024-01-30 DIAGNOSIS — N2581 Secondary hyperparathyroidism of renal origin: Secondary | ICD-10-CM | POA: Diagnosis not present

## 2024-01-30 DIAGNOSIS — D631 Anemia in chronic kidney disease: Secondary | ICD-10-CM | POA: Diagnosis not present

## 2024-01-30 DIAGNOSIS — N186 End stage renal disease: Secondary | ICD-10-CM | POA: Diagnosis not present

## 2024-01-30 DIAGNOSIS — E1129 Type 2 diabetes mellitus with other diabetic kidney complication: Secondary | ICD-10-CM | POA: Diagnosis not present

## 2024-01-30 DIAGNOSIS — D509 Iron deficiency anemia, unspecified: Secondary | ICD-10-CM | POA: Diagnosis not present

## 2024-01-30 DIAGNOSIS — Z992 Dependence on renal dialysis: Secondary | ICD-10-CM | POA: Diagnosis not present

## 2024-01-31 ENCOUNTER — Ambulatory Visit (HOSPITAL_COMMUNITY)
Admission: RE | Admit: 2024-01-31 | Discharge: 2024-01-31 | Disposition: A | Discharge status: Home / Self Care | Source: Ambulatory Visit | Attending: Hematology | Admitting: Hematology

## 2024-01-31 DIAGNOSIS — C799 Secondary malignant neoplasm of unspecified site: Secondary | ICD-10-CM

## 2024-01-31 DIAGNOSIS — C50919 Malignant neoplasm of unspecified site of unspecified female breast: Secondary | ICD-10-CM | POA: Diagnosis not present

## 2024-01-31 DIAGNOSIS — N200 Calculus of kidney: Secondary | ICD-10-CM | POA: Insufficient documentation

## 2024-01-31 DIAGNOSIS — R590 Localized enlarged lymph nodes: Secondary | ICD-10-CM | POA: Insufficient documentation

## 2024-01-31 DIAGNOSIS — N2889 Other specified disorders of kidney and ureter: Secondary | ICD-10-CM | POA: Diagnosis not present

## 2024-01-31 DIAGNOSIS — I517 Cardiomegaly: Secondary | ICD-10-CM | POA: Diagnosis not present

## 2024-01-31 DIAGNOSIS — R918 Other nonspecific abnormal finding of lung field: Secondary | ICD-10-CM | POA: Diagnosis not present

## 2024-01-31 MED ORDER — FLUDEOXYGLUCOSE F - 18 (FDG) INJECTION
9.1200 | Freq: Once | INTRAVENOUS | Status: AC | PRN
  Administered 2024-01-31: 9.12 via INTRAVENOUS

## 2024-02-01 DIAGNOSIS — N2581 Secondary hyperparathyroidism of renal origin: Secondary | ICD-10-CM | POA: Diagnosis not present

## 2024-02-01 DIAGNOSIS — D509 Iron deficiency anemia, unspecified: Secondary | ICD-10-CM | POA: Diagnosis not present

## 2024-02-01 DIAGNOSIS — D631 Anemia in chronic kidney disease: Secondary | ICD-10-CM | POA: Diagnosis not present

## 2024-02-01 DIAGNOSIS — N186 End stage renal disease: Secondary | ICD-10-CM | POA: Diagnosis not present

## 2024-02-01 DIAGNOSIS — E1129 Type 2 diabetes mellitus with other diabetic kidney complication: Secondary | ICD-10-CM | POA: Diagnosis not present

## 2024-02-01 DIAGNOSIS — Z992 Dependence on renal dialysis: Secondary | ICD-10-CM | POA: Diagnosis not present

## 2024-02-04 DIAGNOSIS — D509 Iron deficiency anemia, unspecified: Secondary | ICD-10-CM | POA: Diagnosis not present

## 2024-02-04 DIAGNOSIS — D631 Anemia in chronic kidney disease: Secondary | ICD-10-CM | POA: Diagnosis not present

## 2024-02-04 DIAGNOSIS — N2581 Secondary hyperparathyroidism of renal origin: Secondary | ICD-10-CM | POA: Diagnosis not present

## 2024-02-04 DIAGNOSIS — Z992 Dependence on renal dialysis: Secondary | ICD-10-CM | POA: Diagnosis not present

## 2024-02-04 DIAGNOSIS — E1129 Type 2 diabetes mellitus with other diabetic kidney complication: Secondary | ICD-10-CM | POA: Diagnosis not present

## 2024-02-04 DIAGNOSIS — N186 End stage renal disease: Secondary | ICD-10-CM | POA: Diagnosis not present

## 2024-02-06 ENCOUNTER — Inpatient Hospital Stay: Attending: Hematology | Admitting: Hematology

## 2024-02-06 VITALS — BP 120/65 | HR 97 | Temp 98.2°F | Resp 18 | Wt 179.7 lb

## 2024-02-06 DIAGNOSIS — E669 Obesity, unspecified: Secondary | ICD-10-CM | POA: Diagnosis not present

## 2024-02-06 DIAGNOSIS — E785 Hyperlipidemia, unspecified: Secondary | ICD-10-CM | POA: Diagnosis not present

## 2024-02-06 DIAGNOSIS — I132 Hypertensive heart and chronic kidney disease with heart failure and with stage 5 chronic kidney disease, or end stage renal disease: Secondary | ICD-10-CM | POA: Diagnosis not present

## 2024-02-06 DIAGNOSIS — Z7982 Long term (current) use of aspirin: Secondary | ICD-10-CM | POA: Insufficient documentation

## 2024-02-06 DIAGNOSIS — Z79811 Long term (current) use of aromatase inhibitors: Secondary | ICD-10-CM | POA: Insufficient documentation

## 2024-02-06 DIAGNOSIS — R0789 Other chest pain: Secondary | ICD-10-CM | POA: Diagnosis not present

## 2024-02-06 DIAGNOSIS — R911 Solitary pulmonary nodule: Secondary | ICD-10-CM | POA: Insufficient documentation

## 2024-02-06 DIAGNOSIS — Z79899 Other long term (current) drug therapy: Secondary | ICD-10-CM | POA: Insufficient documentation

## 2024-02-06 DIAGNOSIS — Z9013 Acquired absence of bilateral breasts and nipples: Secondary | ICD-10-CM | POA: Diagnosis not present

## 2024-02-06 DIAGNOSIS — N186 End stage renal disease: Secondary | ICD-10-CM | POA: Insufficient documentation

## 2024-02-06 DIAGNOSIS — Z992 Dependence on renal dialysis: Secondary | ICD-10-CM | POA: Insufficient documentation

## 2024-02-06 DIAGNOSIS — Z170 Estrogen receptor positive status [ER+]: Secondary | ICD-10-CM | POA: Insufficient documentation

## 2024-02-06 DIAGNOSIS — D509 Iron deficiency anemia, unspecified: Secondary | ICD-10-CM | POA: Diagnosis not present

## 2024-02-06 DIAGNOSIS — Z1722 Progesterone receptor negative status: Secondary | ICD-10-CM | POA: Insufficient documentation

## 2024-02-06 DIAGNOSIS — Z9221 Personal history of antineoplastic chemotherapy: Secondary | ICD-10-CM | POA: Insufficient documentation

## 2024-02-06 DIAGNOSIS — C799 Secondary malignant neoplasm of unspecified site: Secondary | ICD-10-CM | POA: Diagnosis not present

## 2024-02-06 DIAGNOSIS — C50211 Malignant neoplasm of upper-inner quadrant of right female breast: Secondary | ICD-10-CM | POA: Insufficient documentation

## 2024-02-06 DIAGNOSIS — Z923 Personal history of irradiation: Secondary | ICD-10-CM | POA: Insufficient documentation

## 2024-02-06 DIAGNOSIS — M8580 Other specified disorders of bone density and structure, unspecified site: Secondary | ICD-10-CM | POA: Diagnosis not present

## 2024-02-06 DIAGNOSIS — I700 Atherosclerosis of aorta: Secondary | ICD-10-CM | POA: Diagnosis not present

## 2024-02-06 DIAGNOSIS — K219 Gastro-esophageal reflux disease without esophagitis: Secondary | ICD-10-CM | POA: Diagnosis not present

## 2024-02-06 DIAGNOSIS — R590 Localized enlarged lymph nodes: Secondary | ICD-10-CM | POA: Insufficient documentation

## 2024-02-06 DIAGNOSIS — E1122 Type 2 diabetes mellitus with diabetic chronic kidney disease: Secondary | ICD-10-CM | POA: Diagnosis not present

## 2024-02-06 DIAGNOSIS — N2581 Secondary hyperparathyroidism of renal origin: Secondary | ICD-10-CM | POA: Diagnosis not present

## 2024-02-06 DIAGNOSIS — I5032 Chronic diastolic (congestive) heart failure: Secondary | ICD-10-CM | POA: Insufficient documentation

## 2024-02-06 DIAGNOSIS — E1140 Type 2 diabetes mellitus with diabetic neuropathy, unspecified: Secondary | ICD-10-CM | POA: Diagnosis not present

## 2024-02-06 DIAGNOSIS — E1129 Type 2 diabetes mellitus with other diabetic kidney complication: Secondary | ICD-10-CM | POA: Diagnosis not present

## 2024-02-06 DIAGNOSIS — D631 Anemia in chronic kidney disease: Secondary | ICD-10-CM | POA: Diagnosis not present

## 2024-02-06 NOTE — Patient Instructions (Addendum)
 [facility]  Discharge Instructions  You were seen and examined today by Dr. Rogers.  Dr. Rogers discussed your most recent lab work and PET scan which revealed that there is a lymph node lighting up under the left underarm.  Dr. Rogers is going to order a biopsy of the left axillary lymph node.    Follow-up as scheduled.    Thank you for choosing [facility] to provide your oncology and hematology care.   To afford each patient quality time with our provider, please arrive at least 15 minutes before your scheduled appointment time. You may need to reschedule your appointment if you arrive late (10 or more minutes). Arriving late affects you and other patients whose appointments are after yours.  Also, if you miss three or more appointments without notifying the office, you may be dismissed from the clinic at the provider's discretion.    Again, thank you for choosing [facility].  Our hope is that these requests will decrease the amount of time that you wait before being seen by our physicians.   If you have a lab appointment with the Cancer Center - please note that after April 8th, all labs will be drawn in the cancer center.  You do not have to check in or register with the main entrance as you have in the past but will complete your check-in at the cancer center.            _____________________________________________________________  Should you have questions after your visit to [facility], please contact our office at [phone] and follow the prompts.  Our office hours are 8:00 a.m. to 4:30 p.m. Monday - Thursday and 8:00 a.m. to 2:30 p.m. Friday.  Please note that voicemails left after 4:00 p.m. may not be returned until the following business day.  We are closed weekends and all major holidays.  You do have access to a nurse 24-7, just call the main number to the clinic [phone] and  do not press any options, hold on the line and a nurse will answer the phone.    For prescription refill requests, have your pharmacy contact our office and allow 72 hours.    Masks are no longer required in the cancer centers. If you would like for your care team to wear a mask while they are taking care of you, please let them know. You may have one support person who is at least 62 years old accompany you for your appointments.

## 2024-02-06 NOTE — Progress Notes (Signed)
 [facility] [address] [facility] [address], [place] [address]    Clinic Day:  02/06/24   Referring physician: Duanne Butler DASEN, MD  Patient Care Team: Duanne Butler DASEN, MD as PCP - General (Family Medicine) Alvan Dorn FALCON, MD as PCP - Cardiology (Cardiology) Shaaron Lamar HERO, MD (Gastroenterology) Debera Jayson MATSU, MD as Consulting Physician (Cardiology) Rogers Hai, MD as Medical Oncologist (Medical Oncology) Rayburn Pac, MD as Consulting Physician (Nephrology)   ASSESSMENT & PLAN:   Assessment: 1.  Stage Ib (T1CN0) right breast cancer, ER positive, PR and HER-2 negative: -Right lumpectomy and sentinel lymph node biopsy on 09/18/2018 shows 1.5 cm IDC, grade 3, associated with high-grade DCIS, negative margins, 0/3 lymph nodes positive, ER 50%, PR negative and HER-2 negative, Ki-67 60%. -Oncotype DX recurrence score 47.  Distant recurrence at 9 years with tamoxifen alone is 36%.  Absolute chemotherapy benefit is more than 15%. -Adjuvant chemotherapy with 4 cycles of AC from 10/30/2018 through 01/01/2019. -She was started on anastrozole  in June 2020. -Mammogram on 09/02/2019 showed calcifications in the posterior aspect of upper outer quadrant of the right breast. -Right partial mastectomy on 02/18/2020 shows high-grade DCIS, 1.5 cm, no invasive carcinoma, resection margins negative.  0/11 lymph nodes.  ER 30% positive, PR negative. -Left simple mastectomy on 06/15/2020 was benign.  Right breast biopsy shows microscopic focus of invasive ductal carcinoma in the background of extensive high-grade DCIS. -Right breast lumpectomy on 07/13/2020 shows multifocal invasive ductal carcinoma, grade 3, largest measuring 1.2 cm.  Extensive high-grade DCIS with necrosis.  Margins are free.  PT1CNX.  There are multiple foci of invasive carcinoma arising from extensive DCIS.  Several small foci of invasive tumor arising from DCIS indicating new primaries.  ER/PR/HER-2 is negative.   Ki-67 is 20%. -PET scan on 09/14/2020 shows areas of nodularity with discrete nodule in the fat of the inferior chest wall within the subcutaneous tissues. -Ultrasound of the right chest wall shows masslike abnormality in the upper inner aspect of the right anterior chest at 1 o'clock position measuring 4.1 cm in greatest dimension.  1.7 cm hypoechoic mass in the central anterior right chest concerning for malignancy.  Possible borderline enlarged residual right axillary lymph node. -Right breast biopsy on 10/12/2020 shows 1.3 cm invasive ductal carcinoma, focally 0.1 cm from superior margin.  DCIS is less than 0.1 cm from superior margin.  PT1CPNX. - Right mastectomy followed by radiation therapy completed in May 2022. - PET scan (11/30/2022): Recurrent/metastatic disease within the right medial pectoralis musculature. - Biopsy (12/19/2022): Metastatic breast adenocarcinoma.  ER positive, 40%, weak staining.  PR negative.  Ki-67 30%.  HER2 IHC 0. - Right chest wall mass resection on 01/18/2023.  Pathology shows 1.7 cm IDC, grade 3.  DCIS with high-grade with necrosis.  Carcinoma involves inked inferior margin with focally invading into atrophic skeletal muscle. - Her case was discussed at multidisciplinary clinic at [facility] on 04/18/2023. - Surgical oncology felt that resection margins are adequate and recommended radiation therapy.  She was also evaluated by medical oncology (Dr. [name]) who has recommended systemic therapy with 6 cycles of docetaxel , carboplatin  and pembrolizumab . - XRT from 05/07/2023 through 05/25/2023. - 6 cycles of carboplatin , docetaxel  and pembrolizumab  from 06/14/2023 through 09/27/2023  2.  Osteopenia: -Bone density on 01/20/2019 shows T score -1.9. - DEXA scan (03/24/2021) T score -2.4  3.  ESRD on HD: - She is on HD on Monday/Wednesday/Friday under the direction of Dr. Rayburn.  Plan: 1.  Recurrent right breast cancer, ER 40%, weak staining, PR negative, HER2  negative: - CT CAP on 11/07/2023: Unchanged subsolid nodule in the peripheral right lower lobe measuring 0.7 cm, stable from 2021.  Additional tiny nodule in the right lower lobe measuring 0.4 cm.  No adenopathy or metastatic disease.  Will follow-up on the PET scan report.   2.  Left subpectoral and axillary adenopathy: - She developed a left chest wall pain and axillary pain in the last 4 weeks. - She was evaluated in the ER on 01/24/2024. - CT chest: New left axillary and subpectoral adenopathy.  Probable nodular scarring in the anterior right middle lobe. - I have reviewed PET CT scan images from 01/31/2024: Hypermetabolic left axillary adenopathy.  Hypermetabolic subpectoral lymph nodes.  Hypermetabolic left supraclavicular lymph node.  Will wait for radiology report. - She did not have any left breast cancer previously.  She had left simple mastectomy. - Recommend ultrasound guided left axillary lymph node biopsy.  Follow-up after the biopsy.   3.  Neuropathy: -She has tingling in the fingertips which is constant.  No neuropathic pains.  Neuropathy in the feet is stable.  Continue gabapentin  300 mg at bedtime.   4.  Right chest wall pain: - She reported right axillary pain has come back for the last 8 weeks. - I did not see any activity on the PET CT scan.  Will wait for the radiology report. - She is taking Percocet 5/325 given by Dr. Duanne 1-2 times daily.  She is taking Tylenol  as needed.     Orders Placed This Encounter  Procedures   US  AXILLARY NODE CORE BIOPSY LEFT    Standing Status:   Future    Expected Date:   02/13/2024    Expiration Date:   02/05/2025    Lab orders requested (DO NOT place separate lab orders, these will be automatically ordered during procedure specimen collection)::   Surgical Pathology    Reason for Exam (SYMPTOM  OR DIAGNOSIS REQUIRED):   left axillary lymph node enlargement    Preferred location?:   [facility]    Release to patient:    Immediate       I,[name],acting as a scribe for [name], MD.,have documented all relevant documentation on the behalf of [name], MD,as directed by  [name], MD while in the presence of [name], MD.  I, [name] MD, have reviewed the above documentation for accuracy and completeness, and I agree with the above.       [name], MD   6/25/20251:29 PM  CHIEF COMPLAINT:   Diagnosis: recurrent right breast cancer    Cancer Staging  Malignant neoplasm of upper-inner quadrant of right female breast [facility]) Staging form: Breast, AJCC 8th Edition - Clinical stage from 09/26/2018: Stage IB (cT1c, cN0, cM0, G3, ER+, PR-, HER2-) - Signed by Stands Alean, MD on 09/26/2018    Prior Therapy: 1. Right lumpectomy and sentinel lymph node biopsy on 09/18/2018  2. Adjuvant chemotherapy with 4 cycles of AC from 10/30/2018 through 01/01/2019  3. Right partial mastectomy on 02/18/2020  4. Right breast lumpectomy on 07/13/2020  5. Right mastectomy followed by radiation therapy completed in May 2022 6.  Anastrozole  started in June 2020  Current Therapy: Carboplatin , docetaxel , pembrolizumab    HISTORY OF PRESENT ILLNESS:   Oncology History  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)  09/18/2018 Initial Diagnosis   Malignant neoplasm of upper-inner  quadrant of right female breast (HCC)   09/26/2018 Cancer Staging   Staging form: Breast, AJCC 8th Edition - Clinical stage from 09/26/2018: Stage IB (cT1c, cN0, cM0, G3, ER+, PR-, HER2-) - Signed by Rogers Hai, MD on 09/26/2018   10/30/2018 - 01/01/2019 Chemotherapy   Patient is on Treatment Plan : BREAST Adjuvant AC q21d     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 female presenting to clinic today for follow up of recurrent right breast cancer. She was last seen by me on 11/13/23.  Since her last visit, she had her port removed on 11/29/23 by Dr. Mavis. Apryl underwent an endoscopic US  on 12/31/23 under Dr. Wilhelmenia  She presented to the ED on 01/16/24 for chest pain and on 01/24/24 for adenopathy. She had CT chest on 01/24/24 that showed: New left axillary and subpectoral adenopathy. Interval development of probable nodular scarring in the anterior right middle lobe. Additional nodules similar to prior CT. Aortic Atherosclerosis.   Rio then had restaging PET done on 01/31/24 due to adenopathy showed on CT concerning for metastatic carcinoma.   Today, she states that she is doing well overall. Her appetite level is at 75%. Her energy level is at 20%. She is accompanied by her husband.   PAST MEDICAL HISTORY:   Past Medical History: Past Medical History:  Diagnosis Date   (HFpEF) heart failure with preserved ejection fraction (HCC)    a. 01/2019 Echo: EF 55-60%, mild conc LVH. DD.  Torn MV chordae.   Anemia    Atypical chest pain    a. 08/2018 MV: EF 59%, no ischemia; b. 02/2019 Cath: nonobs dzs.   Blood transfusion without reported diagnosis    Breast cancer (HCC) 10/12/2020   Cataract    ESRD (end stage renal disease) on dialysis [facility])    a. HD T, T, S   Essential hypertension, benign    GERD (gastroesophageal reflux disease)    Headache    Hemorrhoids    Mixed hyperlipidemia    Morbid obesity (HCC)    Non-obstructive CAD (coronary artery disease)    a. 02/2019 CathL LM nl, LAD 33m, LCX nl, RCA 25p, EF 55-65%.   PONV (postoperative nausea and vomiting)    Renal insufficiency    S/P colonoscopy 08/2009   Dr. Rollin: sessile polyp (benign lymphoid), large hemorrhoids, repeat 5-10 years   Temporal arteritis (HCC)    Type 2 diabetes mellitus (HCC)    Wears  glasses     Surgical History: Past Surgical History:  Procedure Laterality Date   ABDOMINAL HYSTERECTOMY     APPENDECTOMY     ARTERY BIOPSY N/A 05/09/2018   Procedure: RIGHT TEMPORAL ARTERY BIOPSY;  Surgeon: Kimble Agent, MD;  Location: [facility] OR;  Service: General;  Laterality: N/A;   BIOPSY  10/04/2023   Procedure: BIOPSY;  Surgeon: Shaaron Lamar HERO, MD;  Location: [facility] SU[facility];  Service: Endoscopy;;   BREAST BIOPSY Right 06/15/2020   Procedure: RIGHT BREAST BIOPSY;  Surgeon: Mavis Anes, MD;  Location: [facility] ORS;  Service: General;  Laterality: Right;   CATARACT EXTRACTION W/PHACO Left 02/09/2017   Procedure: CATARACT EXTRACTION PHACO AND INTRAOCULAR LENS PLACEMENT LEFT EYE;  Surgeon: Perley,  Cherene, MD;  Location: [facility] ORS;  Service: Ophthalmology;  Laterality: Left;  CDE: 4.89   CATARACT EXTRACTION W/PHACO Right 06/04/2017   Procedure: CATARACT EXTRACTION PHACO AND INTRAOCULAR LENS PLACEMENT (IOC);  Surgeon: Perley Cherene, MD;  Location: [facility] ORS;  Service: Ophthalmology;  Laterality: Right;  CDE: 4.12   CHOLECYSTECTOMY  09/29/2011   Procedure: LAPAROSCOPIC CHOLECYSTECTOMY;  Surgeon: Oneil DELENA Budge, MD;  Location: [facility] ORS;  Service: General;  Laterality: N/A;   COLONOSCOPY  08/2009   Dr. Rollin: sessile polyp (benign lymphoid), large hemorrhoids, repeat 5-10 years   COLONOSCOPY N/A 06/12/2016   prominent hemorrhoids   COLONOSCOPY WITH PROPOFOL  N/A 06/16/2021   Procedure: COLONOSCOPY WITH PROPOFOL ;  Surgeon: Shaaron Lamar HERO, MD;  [facility]: [facility];  Service: Endoscopy;  Laterality: N/A;  9:30am (dialysis pt)   ESOPHAGOGASTRODUODENOSCOPY  09/05/2011   MFM:Dfjoo hiatal hernia; remainder of exam normal. No explanation for patient's abdominal pain with today's examination   ESOPHAGOGASTRODUODENOSCOPY N/A 12/17/2013   Dr. Shaaron: gastric erythema, erosion, mild chronic inflammation on path    ESOPHAGOGASTRODUODENOSCOPY N/A 12/31/2023   Procedure: EGD (ESOPHAGOGASTRODUODENOSCOPY);  Surgeon: Wilhelmenia Aloha Raddle., MD;  Location: [facility];  Service: Gastroenterology;  Laterality: N/A;   ESOPHAGOGASTRODUODENOSCOPY (EGD) WITH PROPOFOL  N/A 06/16/2021   Procedure: ESOPHAGOGASTRODUODENOSCOPY (EGD) WITH PROPOFOL ;  Surgeon: Shaaron Lamar HERO, MD;  [facility]: [facility];  Service: Endoscopy;  Laterality: N/A;   ESOPHAGOGASTRODUODENOSCOPY (EGD) WITH PROPOFOL  N/A 10/04/2023   Procedure: ESOPHAGOGASTRODUODENOSCOPY (EGD) WITH PROPOFOL ;  Surgeon: Shaaron Lamar HERO, MD;  [facility]: [facility];  Service: Endoscopy;  Laterality: N/A;  200pm, ok rm 1-2, pt knows to arrive at 6:45   EUS N/A 12/31/2023   Procedure: ULTRASOUND, UPPER GI TRACT, ENDOSCOPIC;  Surgeon: Wilhelmenia Aloha Raddle., MD;  Location: [facility];  Service: Gastroenterology;  Laterality: N/A;   EXCISION OF BREAST BIOPSY Right 10/12/2020   Procedure: EXCISION OF RIGHT BREAST BIOPSY;  Surgeon: Budge Oneil, MD;  Location: [facility];  Service: General;  Laterality: Right;   IR DIALY SHUNT INTRO NEEDLE/INTRACATH INITIAL W/IMG LEFT Left 07/17/2023   LAPAROSCOPIC APPENDECTOMY  09/29/2011   Procedure: APPENDECTOMY LAPAROSCOPIC;  Surgeon: Oneil DELENA Budge, MD;  Location: [facility];  Service: General;;  incidental appendectomy   LEFT HEART CATH AND CORONARY ANGIOGRAPHY N/A 02/28/2019   Procedure: LEFT HEART CATH AND CORONARY ANGIOGRAPHY;  Surgeon: Dann Candyce RAMAN, MD;  Location: [facility] INVASIVE CV LAB;  Service: Cardiovascular;  Laterality: N/A;   MASS EXCISION Right 01/18/2023   Procedure: EXCISION MASS, RIGHT CHEST S/P MASTECTOMY;  Surgeon: Budge Oneil, MD;  Location: [facility];  Service: General;  Laterality: Right;   MASTECTOMY MODIFIED RADICAL Right 02/18/2020   Procedure: MASTECTOMY MODIFIED RADICAL;  Surgeon: Budge Oneil, MD;  Location: [facility];  Service: General;  Laterality: Right;   MASTECTOMY, PARTIAL Right 07/13/2020   Procedure: RIGHT PARTIAL MASTECTOMY;  Surgeon: Budge Oneil, MD;  Location: [facility];  Service: General;  Laterality: Right;    PARTIAL MASTECTOMY WITH NEEDLE LOCALIZATION AND AXILLARY SENTINEL LYMPH NODE BX Right 09/18/2018   Procedure: RIGHT PARTIAL MASTECTOMY AFTER NEEDLE LOCALIZATION, SENTINEL LYMPH NODE BIOPSY RIGHT AXILLA;  Surgeon: Budge Oneil, MD;  Location: [facility];  Service: General;  Laterality: Right;   POLYPECTOMY  06/16/2021   Procedure: POLYPECTOMY;  Surgeon: Shaaron Lamar HERO, MD;  [facility];  Service: Endoscopy;;   PORT-A-CATH REMOVAL Right 11/29/2023   Procedure: REMOVAL PORT-A-CATH;  Surgeon: Budge Oneil, MD;  Location: [facility];  Service: General;  Laterality: Right;  MINOR PROCEDURE  ROOM   PORTACATH PLACEMENT Right 06/07/2023   Procedure: INSERTION PORT-A-CATH, RIGHT  (DIALYSIS ACCESS ON LEFT);  Surgeon: Mavis Anes, MD;  Location: [facility];  Service: General;  Laterality: Right;   SIMPLE MASTECTOMY WITH AXILLARY SENTINEL NODE BIOPSY Left 06/15/2020   Procedure: LEFT SIMPLE MASTECTOMY;  Surgeon: Mavis Anes, MD;  Location: [facility];  Service: General;  Laterality: Left;    Social History: Social History   Socioeconomic History   Marital status: Married    Spouse name: Not on file   Number of children: Not on file   Years of education: Not on file   Highest education level: 12th grade  Occupational History   Occupation: Systems developer: [employer]  Tobacco Use   Smoking status: Never    Passive exposure: Never   Smokeless tobacco: Never  Vaping Use   Vaping status: Never Used  Substance and Sexual Activity   Alcohol use: No   Drug use: No   Sexual activity: Yes    Birth control/protection: Surgical  Other Topics Concern   Not on file  Social History Narrative   Works at [employer] in [place].    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   Also has to get items from high shelves-causes achy pain in shoulder area      Married.   Children are grown, out of house.   Social Drivers of Corporate investment banker Strain: Low Risk  (09/20/2023)    Overall Financial Resource Strain (CARDIA)    Difficulty of Paying Living Expenses: Not hard at all  Food Insecurity: No Food Insecurity (09/20/2023)   Hunger Vital Sign    Worried About Running Out of Food in the Last Year: Never true    Ran Out of Food in the Last Year: Never true  Transportation Needs: No Transportation Needs (09/20/2023)   PRAPARE - Administrator, Civil Service (Medical): No    Lack of Transportation (Non-Medical): No  Physical Activity: Inactive (09/20/2023)   Exercise Vital Sign    Days of Exercise per Week: 0 days    Minutes of Exercise per Session: 0 min  Stress: No Stress Concern Present (09/20/2023)   [employer] of Occupational Health - Occupational Stress Questionnaire    Feeling of Stress : Not at all  Social Connections: Moderately Integrated (09/20/2023)   Social Connection and Isolation Panel    Frequency of Communication with Friends and Family: More than three times a week    Frequency of Social Gatherings with Friends and Family: Three times a week    Attends Religious Services: More than 4 times per year    Active Member of Clubs or Organizations: No    Attends Banker Meetings: Never    Marital Status: Married  Catering manager Violence: Not At Risk (09/20/2023)   Humiliation, Afraid, Rape, and Kick questionnaire    Fear of Current or Ex-Partner: No    Emotionally Abused: No    Physically Abused: No    Sexually Abused: No    Family History: Family History  Problem Relation Age of Onset   Hypertension Mother    Coronary artery disease Mother    Diabetes Mother    Heart attack Father    Hypertension Sister    Coronary artery disease Sister    Hypertension Brother    Colon cancer Brother    Heart attack Maternal Grandmother    Heart  attack Maternal Grandfather    Heart attack Paternal Grandmother    Heart attack Paternal Grandfather    Hypertension Son    Heart attack Maternal Aunt    Hypertension Maternal Aunt     Diabetes Maternal Aunt    Heart attack Maternal Uncle    Hypertension Maternal Uncle    Diabetes Maternal Uncle    Heart attack Paternal Aunt    Hypertension Paternal Aunt    Diabetes Paternal Aunt    Heart attack Paternal Uncle    Hypertension Paternal Uncle    Diabetes Paternal Uncle     Current Medications:  Current Outpatient Medications:    amLODipine  (NORVASC ) 5 MG tablet, Take 5 mg by mouth at bedtime., Disp: , Rfl:    aspirin  EC 81 MG tablet, Take 81 mg by mouth daily., Disp: , Rfl:    atorvastatin  (LIPITOR) 20 MG tablet, TAKE 1 TABLET(20 MG) BY MOUTH DAILY, Disp: 90 tablet, Rfl: 0   B Complex-C-Zn-Folic Acid  (DIALYVITE  800-ZINC  15) 0.8 MG TABS, Take 1 tablet by mouth daily., Disp: , Rfl:    cinacalcet  (SENSIPAR ) 60 MG tablet, Take 60 mg by mouth daily., Disp: , Rfl:    Darbepoetin Alfa  (ARANESP , ALBUMIN FREE, IJ), Darbepoetin Alfa  (Aranesp ), Disp: , Rfl:    fluticasone  (FLONASE ) 50 MCG/ACT nasal spray, Place 1 spray into both nostrils daily., Disp: , Rfl:    gabapentin  (NEURONTIN ) 300 MG capsule, TAKE 1 CAPSULE(300 MG) BY MOUTH AT BEDTIME, Disp: 90 capsule, Rfl: 3   iron  sucrose (VENOFER ) 20 MG/ML injection, Inject 50 mg into the vein once a week., Disp: , Rfl:    lanthanum  (FOSRENOL ) 1000 MG chewable tablet, Chew 2,000-3,000 mg by mouth See admin instructions. Take 3 tablets (3000 mg) by mouth with meals and take 2 tablets (2000 mg) with snacks, Disp: , Rfl:    lidocaine -prilocaine  (EMLA ) cream, APPLY TOPICALLY TO THE AFFECTED AREA 1 TIME, Disp: 30 g, Rfl: 3   meclizine  (ANTIVERT ) 25 MG tablet, Take 1 tablet (25 mg total) by mouth 3 (three) times daily as needed for dizziness., Disp: 30 tablet, Rfl: 1   nebivolol  (BYSTOLIC ) 10 MG tablet, TAKE 1 TABLET BY MOUTH IN THE MORNING AND AT BEDTIME, Disp: 180 tablet, Rfl: 0   nitroGLYCERIN  (NITROSTAT ) 0.4 MG SL tablet, Place 1 tablet (0.4 mg total) under the tongue every 5 (five) minutes x 3 doses as needed for chest pain (if no  relief after 3rd dose, proceed to ED or call 911)., Disp: 25 tablet, Rfl: 3   oxyCODONE  (ROXICODONE ) 5 MG immediate release tablet, Take 1 tablet (5 mg total) by mouth every 4 (four) hours as needed., Disp: 20 tablet, Rfl: 0   oxyCODONE -acetaminophen  (PERCOCET/ROXICET) 5-325 MG tablet, Take 1-2 tablets by mouth every 4 (four) hours as needed for severe pain (pain score 7-10)., Disp: 60 tablet, Rfl: 0   pantoprazole  (PROTONIX ) 40 MG tablet, Take 1 tablet (40 mg total) by mouth 2 (two) times daily before a meal., Disp: 60 tablet, Rfl: 11   Allergies: Allergies  Allergen Reactions   Motrin [Ibuprofen] Other (See Comments)    ESRD     REVIEW OF SYSTEMS:   Review of Systems  Constitutional:  Negative for chills, fatigue and fever.  HENT:   Negative for lump/mass, mouth sores, nosebleeds, sore throat and trouble swallowing.   Eyes:  Negative for eye problems.  Respiratory:  Positive for shortness of breath. Negative for cough.   Cardiovascular:  Positive for chest pain. Negative for leg swelling and  palpitations.  Gastrointestinal:  Negative for abdominal pain, constipation, diarrhea, nausea and vomiting.  Genitourinary:  Negative for bladder incontinence, difficulty urinating, dysuria, frequency, hematuria and nocturia.   Musculoskeletal:  Negative for arthralgias, back pain, flank pain, myalgias and neck pain.  Skin:  Negative for itching and rash.  Neurological:  Positive for dizziness and numbness. Negative for headaches.  Hematological:  Does not bruise/bleed easily.  Psychiatric/Behavioral:  Positive for depression. Negative for sleep disturbance and suicidal ideas. The patient is nervous/anxious.   All other systems reviewed and are negative.    VITALS:   Blood pressure 120/65, pulse 97, temperature 98.2 F (36.8 C), temperature source Oral, resp. rate 18, weight 179 lb 10.8 oz (81.5 kg), SpO2 100%.  Wt Readings from Last 3 Encounters:  02/06/24 179 lb 10.8 oz (81.5 kg)   01/29/24 183 lb (83 kg)  01/28/24 183 lb (83 kg)    Body mass index is 33.4 kg/m.  Performance status (ECOG): 1 - Symptomatic but completely ambulatory  PHYSICAL EXAM:   Physical Exam Vitals and nursing note reviewed. Exam conducted with a chaperone present.  Constitutional:      Appearance: Normal appearance.   Cardiovascular:     Rate and Rhythm: Normal rate and regular rhythm.     Pulses: Normal pulses.     Heart sounds: Normal heart sounds.  Pulmonary:     Effort: Pulmonary effort is normal.     Breath sounds: Normal breath sounds.  Abdominal:     Palpations: Abdomen is soft. There is no hepatomegaly, splenomegaly or mass.     Tenderness: There is no abdominal tenderness.   Musculoskeletal:     Right lower leg: No edema.     Left lower leg: No edema.  Lymphadenopathy:     Cervical: No cervical adenopathy.     Right cervical: No superficial, deep or posterior cervical adenopathy.    Left cervical: No superficial, deep or posterior cervical adenopathy.     Upper Body:     Right upper body: No supraclavicular or axillary adenopathy.     Left upper body: No supraclavicular or axillary adenopathy.   Neurological:     General: No focal deficit present.     Mental Status: She is alert and oriented to person, place, and time.   Psychiatric:        Mood and Affect: Mood normal.        Behavior: Behavior normal.     LABS:      Latest Ref Rng & Units 01/24/2024    9:18 AM 01/16/2024    8:55 AM 01/10/2024   11:13 AM  CBC  WBC 4.0 - 10.5 K/uL 6.9  8.9  6.6   Hemoglobin 12.0 - 15.0 g/dL 89.7  87.4  89.1   Hematocrit 36.0 - 46.0 % 33.2  38.4  33.0   Platelets 150 - 400 K/uL 209  271  255       Latest Ref Rng & Units 01/24/2024    9:18 AM 01/16/2024    8:55 AM 01/10/2024   11:13 AM  CMP  Glucose 70 - 99 mg/dL 97  894  878   BUN 8 - 23 mg/dL 37  25  47   Creatinine 0.44 - 1.00 mg/dL 89.29  3.74  0.91   Sodium 135 - 145 mmol/L 140  134  139   Potassium 3.5 - 5.1  mmol/L 4.8  3.2  4.0   Chloride 98 - 111 mmol/L 99  91  96  CO2 22 - 32 mmol/L 26  28  30    Calcium  8.9 - 10.3 mg/dL 8.7  9.9  9.7   Total Protein 6.5 - 8.1 g/dL  8.8    Total Bilirubin 0.0 - 1.2 mg/dL  0.6    Alkaline Phos 38 - 126 U/L  146    AST 15 - 41 U/L  21    ALT 0 - 44 U/L  28       No results found for: CEA1, CEA / No results found for: CEA1, CEA No results found for: PSA1 No results found for: CAN199 No results found for: CAN125  No results found for: TOTALPROTELP, ALBUMINELP, A1GS, A2GS, BETS, BETA2SER, GAMS, MSPIKE, SPEI Lab Results  Component Value Date   TIBC 195 (L) 02/15/2021   TIBC 190 (L) 12/10/2018   TIBC 218 (L) 06/08/2016   FERRITIN 1,742 (H) 02/15/2021   FERRITIN 1,308 (H) 12/10/2018   FERRITIN 126 06/08/2016   IRONPCTSAT 15 02/15/2021   IRONPCTSAT 43 (H) 12/10/2018   IRONPCTSAT 22 06/08/2016   No results found for: LDH   STUDIES:   CT Chest W Contrast Result Date: 01/24/2024 CLINICAL DATA:  History of breast cancer. Left upper are chest mass/tenderness. EXAM: CT CHEST WITH CONTRAST TECHNIQUE: Multidetector CT imaging of the chest was performed during intravenous contrast administration. RADIATION DOSE REDUCTION: This exam was performed according to the departmental dose-optimization program which includes automated exposure control, adjustment of the mA and/or kV according to patient size and/or use of iterative reconstruction technique. CONTRAST:  OMNIPAQUE  IOHEXOL  300 MG/ML  SOLN COMPARISON:  CT dated 11/07/2023. FINDINGS: Cardiovascular: There is mild cardiomegaly. No pericardial effusion. Mild atherosclerotic calcification of the thoracic aorta. No aneurysmal dilatation or dissection. The origins of the great vessels of the aortic arch and the central pulmonary arteries are patent. Mediastinum/Nodes: No hilar or mediastinal adenopathy. The esophagus is grossly unremarkable. No mediastinal fluid collection.  Lungs/Pleura: Right apical and right upper lobe linear scarring or post radiation changes. A 7 mm nodule in the right upper lobe similar to prior CT. A 5 x 11 mm nodular area in the anterior right middle lobe (67/3) is new since the prior CT and likely an area of nodular scarring. A 5 mm ground-glass nodule in the lateral right lower lobe is similar to prior CT. Additional 3 mm right lung base subpleural nodule (89/3) similar to prior CT. No consolidative changes. There is no pleural effusion or pneumothorax. The central airways are patent. There are ill-defined faint areas of ground-glass density in the subpleural left upper lobe which may represent atelectasis. Atypical pneumonia is not excluded. The central airways are patent. Upper Abdomen: No acute abnormality. Musculoskeletal: Degenerative changes of the spine. No acute osseous pathology. Partially visualized left axillary adenopathy, new since the prior CT. A left chest wall subpectoral lymph node measures 10 mm short axis, new since the prior CT. Right axillary node dissection clips. Left supraclavicular rounded lymph node measures 8 mm (18/2). Postsurgical changes of right mastectomy similar to prior CT. Similar appearance of soft tissue thickening of the right pectoral musculature. IMPRESSION: 1. New left axillary and subpectoral adenopathy. 2. Interval development of probable nodular scarring in the anterior right middle lobe. Additional nodules similar to prior CT 3.  Aortic Atherosclerosis (ICD10-I70.0). Electronically Signed   By: Vanetta Chou M.D.   On: 01/24/2024 11:35   DG Chest Portable 1 View Result Date: 01/16/2024 CLINICAL DATA:  Chest pain. EXAM: PORTABLE CHEST 1 VIEW COMPARISON:  Chest  radiograph dated 10/29/2023. FINDINGS: No focal consolidation, pleural effusion, pneumothorax. The cardiac silhouette is within normal limits. Atherosclerotic calcification of the aorta. No acute osseous pathology. IMPRESSION: No active disease.  Electronically Signed   By: Vanetta Chou M.D.   On: 01/16/2024 11:16

## 2024-02-07 NOTE — Progress Notes (Signed)
 Megan Aran, MD  [name] PROCEDURE / BIOPSY REVIEW Date: 02/07/24  Requested Biopsy site: Left axillary LN Reason for request: Positive PET, history of breast CA Imaging review: Best seen on PET  Decision: Approved Imaging modality to perform: Ultrasound Schedule with: No sedation / Local anesthetic Schedule for: Any VIR  Additional comments:   Please contact me with questions, concerns, or if issue pertaining to this request arise.  [name], MD [facility]       Previous Messages    ----- Message ----- From: [name] Sent: 02/06/2024   3:29 PM EDT To: [name]; Ir Procedure Requests Subject: US  AXILLARY NODE CORE BIOPSY LEFT              Procedure : US  AXILLARY NODE CORE BIOPSY LEFT  Reason: left axillary lymph node enlargement Dx: Metastatic adenocarcinoma (HCC) [C79.9 (ICD-10-CM)]    History : NM PET restage Skull base to thigh , CT chest w/ , DG chest port 1 view , CT Chest abd pelv w/  Provider : Rogers Hai, MD  Provider contact : [phone]

## 2024-02-08 DIAGNOSIS — N2581 Secondary hyperparathyroidism of renal origin: Secondary | ICD-10-CM | POA: Diagnosis not present

## 2024-02-08 DIAGNOSIS — D631 Anemia in chronic kidney disease: Secondary | ICD-10-CM | POA: Diagnosis not present

## 2024-02-08 DIAGNOSIS — N186 End stage renal disease: Secondary | ICD-10-CM | POA: Diagnosis not present

## 2024-02-08 DIAGNOSIS — E1129 Type 2 diabetes mellitus with other diabetic kidney complication: Secondary | ICD-10-CM | POA: Diagnosis not present

## 2024-02-08 DIAGNOSIS — D509 Iron deficiency anemia, unspecified: Secondary | ICD-10-CM | POA: Diagnosis not present

## 2024-02-08 DIAGNOSIS — Z992 Dependence on renal dialysis: Secondary | ICD-10-CM | POA: Diagnosis not present

## 2024-02-11 ENCOUNTER — Other Ambulatory Visit: Payer: Self-pay | Admitting: Family Medicine

## 2024-02-11 DIAGNOSIS — N2581 Secondary hyperparathyroidism of renal origin: Secondary | ICD-10-CM | POA: Diagnosis not present

## 2024-02-11 DIAGNOSIS — D631 Anemia in chronic kidney disease: Secondary | ICD-10-CM | POA: Diagnosis not present

## 2024-02-11 DIAGNOSIS — N186 End stage renal disease: Secondary | ICD-10-CM | POA: Diagnosis not present

## 2024-02-11 DIAGNOSIS — D509 Iron deficiency anemia, unspecified: Secondary | ICD-10-CM | POA: Diagnosis not present

## 2024-02-11 DIAGNOSIS — Z992 Dependence on renal dialysis: Secondary | ICD-10-CM | POA: Diagnosis not present

## 2024-02-11 DIAGNOSIS — E1129 Type 2 diabetes mellitus with other diabetic kidney complication: Secondary | ICD-10-CM | POA: Diagnosis not present

## 2024-02-12 ENCOUNTER — Ambulatory Visit (INDEPENDENT_AMBULATORY_CARE_PROVIDER_SITE_OTHER): Admitting: Gastroenterology

## 2024-02-12 VITALS — BP 111/71 | HR 88 | Temp 98.6°F | Ht 61.5 in | Wt 182.2 lb

## 2024-02-12 DIAGNOSIS — Z992 Dependence on renal dialysis: Secondary | ICD-10-CM | POA: Diagnosis not present

## 2024-02-12 DIAGNOSIS — K219 Gastro-esophageal reflux disease without esophagitis: Secondary | ICD-10-CM | POA: Diagnosis not present

## 2024-02-12 DIAGNOSIS — N040 Nephrotic syndrome with minor glomerular abnormality: Secondary | ICD-10-CM | POA: Diagnosis not present

## 2024-02-12 DIAGNOSIS — R933 Abnormal findings on diagnostic imaging of other parts of digestive tract: Secondary | ICD-10-CM

## 2024-02-12 DIAGNOSIS — D649 Anemia, unspecified: Secondary | ICD-10-CM

## 2024-02-12 DIAGNOSIS — Z8719 Personal history of other diseases of the digestive system: Secondary | ICD-10-CM

## 2024-02-12 DIAGNOSIS — Z800 Family history of malignant neoplasm of digestive organs: Secondary | ICD-10-CM

## 2024-02-12 DIAGNOSIS — K625 Hemorrhage of anus and rectum: Secondary | ICD-10-CM | POA: Diagnosis not present

## 2024-02-12 DIAGNOSIS — K319 Disease of stomach and duodenum, unspecified: Secondary | ICD-10-CM

## 2024-02-12 DIAGNOSIS — N186 End stage renal disease: Secondary | ICD-10-CM | POA: Diagnosis not present

## 2024-02-12 NOTE — Patient Instructions (Signed)
 Let's continue the pantoprazole  twice a day, 30 minutes before meals.  Dr. Melba office will be in touch to repeat the endoscopic ultrasound in a few months.  If you have further bleeding, please call me!  We will see you in October 2025.  Have a wonderful July 4th with your family and a happy birthday in August!  I enjoyed seeing you again today! I value our relationship and want to provide genuine, compassionate, and quality care. You may receive a survey regarding your visit with me, and I welcome your feedback! Thanks so much for taking the time to complete this. I look forward to seeing you again.      Therisa MICAEL Stager, PhD, ANP-BC [facility] Gastroenterology

## 2024-02-12 NOTE — Progress Notes (Signed)
 Gastroenterology Office Note     Primary Care Physician:  Duanne Butler DASEN, MD  Primary Gastroenterologist:Dr. Shaaron    Chief Complaint   Chief Complaint  Patient presents with   Follow-up    Follow up on GERD and follow up bleeding. No bleeding lately and follow up after procedure     History of Present Illness   Megan [name] is a 63 y.o. female presenting today with a history of  internal hemorrhoids s/p banding in 2021 and April/May 2023, Cdiff in July 2022, anemia, HFpEF, recurrent breast cancer, GERD, HTN, ESRD on dialysis, CAD, HLD, and diabetes, recently with new onset dyspepsia Feb 2025  s/p EGD with nodular mucosa, followed by EUS in May s/p ligation of nodular lesions X 2 that were actively oozing.  She has also had issues with persistent rectal bleeding in setting of Grade 3 hemorrhoids. Here today to follow-up after EUS.   EGD Feb 2025 with nodular mucosa but negative for malignancy. However, PET scan Oct 2024 with intense metabolic activity in gastric antrum felt inflammatory at that time.   EUS May 2025 as noted below without malignant-appearing lymph nodes and s/p ligation of 2 nodular GAVE vs hyperplastic polyps that were eroded and active suction ooze. 1 larger lesions remains with plans for EUS and therapeutic intervention as needed in 3-4 months by Dr. Wilhelmenia.    She now notes  left sided upper extremity lymphadenopathy. Biopsy upcoming. She had episode of rectal bleeding after EUS but CBC was stable. Felt related to benign anorecal source and was not consistent with rapid transit UGI bleed. No further rectal bleeding. No straining. No abdominal pain. Appetite waxes and wanes, chronically. Pantoprazole  daily, sometimes not BID. No constipation, no bloating. Wants to hold off on colonoscopy.   EUS Dr. Wilhelmenia May 2025: no gross lesions in esophagus, gastric mucosal atrophy s/p bisopy, enlarged gastric folds with nodular GAVE vs hyperplastic  polyps  EGD Feb 2025: normal esophagus, nodular infiltrating appearing antral mucosa suspicious for neoplasia with spontaneous oozing s/p biopsy and hemospray. Path with reactive gastritis, no malignancy.    Colonoscopy Nov 2022: non-bleeding internal hemorrhoids   EGD Nov 2022: normal esophagus, small hiatal hernia, one gastric polyp s/p removal, normal duodenum. Path with hyperplastic polyp.      FH colon cancer: brother, diagnosed at 3 last year 2024.   Past Medical History:  Diagnosis Date   (HFpEF) heart failure with preserved ejection fraction (HCC)    a. 01/2019 Echo: EF 55-60%, mild conc LVH. DD.  Torn MV chordae.   Anemia    Atypical chest pain    a. 08/2018 MV: EF 59%, no ischemia; b. 02/2019 Cath: nonobs dzs.   Blood transfusion without reported diagnosis    Breast cancer (HCC) 10/12/2020   Cataract    ESRD (end stage renal disease) on dialysis [facility])    a. HD T, T, S   Essential hypertension, benign    GERD (gastroesophageal reflux disease)    Headache    Hemorrhoids    Mixed hyperlipidemia    Morbid obesity (HCC)    Non-obstructive CAD (coronary artery disease)    a. 02/2019 CathL LM nl, LAD 27m, LCX nl, RCA 25p, EF 55-65%.   PONV (postoperative nausea and vomiting)    Renal insufficiency    S/P colonoscopy 08/2009   Dr. Rollin: sessile polyp (benign lymphoid), large hemorrhoids, repeat 5-10 years   Temporal arteritis (HCC)    Type 2 diabetes mellitus (HCC)  Wears glasses     Past Surgical History:  Procedure Laterality Date   ABDOMINAL HYSTERECTOMY     APPENDECTOMY     ARTERY BIOPSY N/A 05/09/2018   Procedure: RIGHT TEMPORAL ARTERY BIOPSY;  Surgeon: Kimble Agent, MD;  Location: [facility] OR;  Service: General;  Laterality: N/A;   BIOPSY  10/04/2023   Procedure: BIOPSY;  Surgeon: Shaaron Lamar HERO, MD;  [facility];  Service: Endoscopy;;   BREAST BIOPSY Right 06/15/2020   Procedure: RIGHT BREAST BIOPSY;  Surgeon: Mavis Anes, MD;  Location: [facility]S;   Service: General;  Laterality: Right;   CATARACT EXTRACTION W/PHACO Left 02/09/2017   Procedure: CATARACT EXTRACTION PHACO AND INTRAOCULAR LENS PLACEMENT LEFT EYE;  Surgeon: Perley Hamilton, MD;  Location: [facility];  Service: Ophthalmology;  Laterality: Left;  CDE: 4.89   CATARACT EXTRACTION W/PHACO Right 06/04/2017   Procedure: CATARACT EXTRACTION PHACO AND INTRAOCULAR LENS PLACEMENT (IOC);  Surgeon: Perley Hamilton, MD;  Location: [facility];  Service: Ophthalmology;  Laterality: Right;  CDE: 4.12   CHOLECYSTECTOMY  09/29/2011   Procedure: LAPAROSCOPIC CHOLECYSTECTOMY;  Surgeon: Anes DELENA Mavis, MD;  Location: [facility];  Service: General;  Laterality: N/A;   COLONOSCOPY  08/2009   Dr. Rollin: sessile polyp (benign lymphoid), large hemorrhoids, repeat 5-10 years   COLONOSCOPY N/A 06/12/2016   prominent hemorrhoids   COLONOSCOPY WITH PROPOFOL  N/A 06/16/2021   Procedure: COLONOSCOPY WITH PROPOFOL ;  Surgeon: Shaaron Lamar HERO, MD;  [facility];  Service: Endoscopy;  Laterality: N/A;  9:30am (dialysis pt)   ESOPHAGOGASTRODUODENOSCOPY  09/05/2011   MFM:Dfjoo hiatal hernia; remainder of exam normal. No explanation for patient's abdominal pain with today's examination   ESOPHAGOGASTRODUODENOSCOPY N/A 12/17/2013   Dr. Shaaron: gastric erythema, erosion, mild chronic inflammation on path    ESOPHAGOGASTRODUODENOSCOPY N/A 12/31/2023   Procedure: EGD (ESOPHAGOGASTRODUODENOSCOPY);  Surgeon: Wilhelmenia Aloha Raddle., MD;  Location: [facility];  Service: Gastroenterology;  Laterality: N/A;   ESOPHAGOGASTRODUODENOSCOPY (EGD) WITH PROPOFOL  N/A 06/16/2021   Procedure: ESOPHAGOGASTRODUODENOSCOPY (EGD) WITH PROPOFOL ;  Surgeon: Shaaron Lamar HERO, MD;  [facility];  Service: Endoscopy;  Laterality: N/A;   ESOPHAGOGASTRODUODENOSCOPY (EGD) WITH PROPOFOL  N/A 10/04/2023   Procedure: ESOPHAGOGASTRODUODENOSCOPY (EGD) WITH PROPOFOL ;  Surgeon: Shaaron Lamar HERO, MD;  [facility];  Service: Endoscopy;  Laterality:  N/A;  200pm, ok rm 1-2, pt knows to arrive at 6:45   EUS N/A 12/31/2023   Procedure: ULTRASOUND, UPPER GI TRACT, ENDOSCOPIC;  Surgeon: Wilhelmenia Aloha Raddle., MD;  Location: [facility];  Service: Gastroenterology;  Laterality: N/A;   EXCISION OF BREAST BIOPSY Right 10/12/2020   Procedure: EXCISION OF RIGHT BREAST BIOPSY;  Surgeon: Mavis Anes, MD;  Location: [facility];  Service: General;  Laterality: Right;   IR DIALY SHUNT INTRO NEEDLE/INTRACATH INITIAL W/IMG LEFT Left 07/17/2023   LAPAROSCOPIC APPENDECTOMY  09/29/2011   Procedure: APPENDECTOMY LAPAROSCOPIC;  Surgeon: Anes DELENA Mavis, MD;  Location: [facility];  Service: General;;  incidental appendectomy   LEFT HEART CATH AND CORONARY ANGIOGRAPHY N/A 02/28/2019   Procedure: LEFT HEART CATH AND CORONARY ANGIOGRAPHY;  Surgeon: Dann Candyce RAMAN, MD;  Location: [facility] INVASIVE CV LAB;  Service: Cardiovascular;  Laterality: N/A;   MASS EXCISION Right 01/18/2023   Procedure: EXCISION MASS, RIGHT CHEST S/P MASTECTOMY;  Surgeon: Mavis Anes, MD;  Location: [facility];  Service: General;  Laterality: Right;   MASTECTOMY MODIFIED RADICAL Right 02/18/2020   Procedure: MASTECTOMY MODIFIED RADICAL;  Surgeon: Mavis Anes, MD;  Location: [facility];  Service: General;  Laterality: Right;  MASTECTOMY, PARTIAL Right 07/13/2020   Procedure: RIGHT PARTIAL MASTECTOMY;  Surgeon: Mavis Anes, MD;  Location: [facility];  Service: General;  Laterality: Right;   PARTIAL MASTECTOMY WITH NEEDLE LOCALIZATION AND AXILLARY SENTINEL LYMPH NODE BX Right 09/18/2018   Procedure: RIGHT PARTIAL MASTECTOMY AFTER NEEDLE LOCALIZATION, SENTINEL LYMPH NODE BIOPSY RIGHT AXILLA;  Surgeon: Mavis Anes, MD;  Location: [facility];  Service: General;  Laterality: Right;   POLYPECTOMY  06/16/2021   Procedure: POLYPECTOMY;  Surgeon: Shaaron Lamar HERO, MD;  [facility];  Service: Endoscopy;;   PORT-A-CATH REMOVAL Right 11/29/2023   Procedure: REMOVAL PORT-A-CATH;  Surgeon: Mavis Anes, MD;   Location: [facility];  Service: General;  Laterality: Right;  MINOR PROCEDURE ROOM   PORTACATH PLACEMENT Right 06/07/2023   Procedure: INSERTION PORT-A-CATH, RIGHT  (DIALYSIS ACCESS ON LEFT);  Surgeon: Mavis Anes, MD;  Location: [facility];  Service: General;  Laterality: Right;   SIMPLE MASTECTOMY WITH AXILLARY SENTINEL NODE BIOPSY Left 06/15/2020   Procedure: LEFT SIMPLE MASTECTOMY;  Surgeon: Mavis Anes, MD;  Location: [facility];  Service: General;  Laterality: Left;    Current Outpatient Medications  Medication Sig Dispense Refill   amLODipine  (NORVASC ) 5 MG tablet Take 5 mg by mouth at bedtime.     aspirin  EC 81 MG tablet Take 81 mg by mouth daily.     atorvastatin  (LIPITOR) 20 MG tablet TAKE 1 TABLET(20 MG) BY MOUTH DAILY 90 tablet 0   B Complex-C-Zn-Folic Acid  (DIALYVITE  800-ZINC  15) 0.8 MG TABS Take 1 tablet by mouth daily.     cinacalcet  (SENSIPAR ) 60 MG tablet Take 60 mg by mouth daily.     Darbepoetin Alfa  (ARANESP , ALBUMIN FREE, IJ) Darbepoetin Alfa  (Aranesp )     fluticasone  (FLONASE ) 50 MCG/ACT nasal spray Place 1 spray into both nostrils daily.     gabapentin  (NEURONTIN ) 300 MG capsule TAKE 1 CAPSULE(300 MG) BY MOUTH AT BEDTIME 90 capsule 3   iron  sucrose (VENOFER ) 20 MG/ML injection Inject 50 mg into the vein once a week.     lanthanum  (FOSRENOL ) 1000 MG chewable tablet Chew 2,000-3,000 mg by mouth See admin instructions. Take 3 tablets (3000 mg) by mouth with meals and take 2 tablets (2000 mg) with snacks     lidocaine -prilocaine  (EMLA ) cream APPLY TOPICALLY TO THE AFFECTED AREA 1 TIME 30 g 3   meclizine  (ANTIVERT ) 25 MG tablet Take 1 tablet (25 mg total) by mouth 3 (three) times daily as needed for dizziness. 30 tablet 1   nebivolol  (BYSTOLIC ) 10 MG tablet TAKE 1 TABLET BY MOUTH IN THE MORNING AND AT BEDTIME 180 tablet 0   nitroGLYCERIN  (NITROSTAT ) 0.4 MG SL tablet Place 1 tablet (0.4 mg total) under the tongue every 5 (five) minutes x 3 doses as needed for chest pain (if no relief  after 3rd dose, proceed to ED or call 911). 25 tablet 3   oxyCODONE  (ROXICODONE ) 5 MG immediate release tablet Take 1 tablet (5 mg total) by mouth every 4 (four) hours as needed. 20 tablet 0   oxyCODONE -acetaminophen  (PERCOCET/ROXICET) 5-325 MG tablet Take 1-2 tablets by mouth every 4 (four) hours as needed for severe pain (pain score 7-10). 60 tablet 0   pantoprazole  (PROTONIX ) 40 MG tablet Take 1 tablet (40 mg total) by mouth 2 (two) times daily before a meal. 60 tablet 11   No current facility-administered medications for this visit.    Allergies as of 02/12/2024 - Review Complete 02/12/2024  Allergen Reaction Noted   Motrin [ibuprofen] Other (See Comments)  06/25/2023    Family History  Problem Relation Age of Onset   Hypertension Mother    Coronary artery disease Mother    Diabetes Mother    Heart attack Father    Hypertension Sister    Coronary artery disease Sister    Hypertension Brother    Colon cancer Brother    Heart attack Maternal Grandmother    Heart attack Maternal Grandfather    Heart attack Paternal Grandmother    Heart attack Paternal Grandfather    Hypertension Son    Heart attack Maternal Aunt    Hypertension Maternal Aunt    Diabetes Maternal Aunt    Heart attack Maternal Uncle    Hypertension Maternal Uncle    Diabetes Maternal Uncle    Heart attack Paternal Aunt    Hypertension Paternal Aunt    Diabetes Paternal Aunt    Heart attack Paternal Uncle    Hypertension Paternal Uncle    Diabetes Paternal Uncle     Social History   Socioeconomic History   Marital status: Married    Spouse name: Not on file   Number of children: Not on file   Years of education: Not on file   Highest education level: 12th grade  Occupational History   Occupation: Systems developer: [employer]  Tobacco Use   Smoking status: Never    Passive exposure: Never   Smokeless tobacco: Never  Vaping Use   Vaping status: Never Used  Substance and Sexual Activity    Alcohol use: No   Drug use: No   Sexual activity: Yes    Birth control/protection: Surgical  Other Topics Concern   Not on file  Social History Narrative   Works at [employer] in [place].    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   Also has to get items from high shelves-causes achy pain in shoulder area      Married.   Children are grown, out of house.   Social Drivers of Corporate investment banker Strain: Low Risk  (09/20/2023)   Overall Financial Resource Strain (CARDIA)    Difficulty of Paying Living Expenses: Not hard at all  Food Insecurity: No Food Insecurity (09/20/2023)   Hunger Vital Sign    Worried About Running Out of Food in the Last Year: Never true    Ran Out of Food in the Last Year: Never true  Transportation Needs: No Transportation Needs (09/20/2023)   PRAPARE - Administrator, Civil Service (Medical): No    Lack of Transportation (Non-Medical): No  Physical Activity: Inactive (09/20/2023)   Exercise Vital Sign    Days of Exercise per Week: 0 days    Minutes of Exercise per Session: 0 min  Stress: No Stress Concern Present (09/20/2023)   [employer] of Occupational Health - Occupational Stress Questionnaire    Feeling of Stress : Not at all  Social Connections: Moderately Integrated (09/20/2023)   Social Connection and Isolation Panel    Frequency of Communication with Friends and Family: More than three times a week    Frequency of Social Gatherings with Friends and Family: Three times a week    Attends Religious Services: More than 4 times per year    Active Member of Clubs or Organizations: No    Attends Banker Meetings: Never    Marital Status: Married  Catering manager Violence: Not At Risk (09/20/2023)   Humiliation, Afraid,  Rape, and Kick questionnaire    Fear of Current or Ex-Partner: No    Emotionally Abused: No    Physically Abused: No    Sexually Abused: No     Review of Systems   Gen: Denies any  fever, chills, fatigue, weight loss, lack of appetite.  CV: Denies chest pain, heart palpitations, peripheral edema, syncope.  Resp: Denies shortness of breath at rest or with exertion. Denies wheezing or cough.  GI: see HPI GU : Denies urinary burning, urinary frequency, urinary hesitancy MS: Denies joint pain, muscle weakness, cramps, or limitation of movement.  Derm: Denies rash, itching, dry skin Psych: Denies depression, anxiety, memory loss, and confusion Heme: Denies bruising, bleeding, and enlarged lymph nodes.   Physical Exam   BP 111/71   Pulse 88   Temp 98.6 F (37 C)   Ht 5' 1.5 (1.562 m)   Wt 182 lb 3.2 oz (82.6 kg)   BMI 33.87 kg/m  General:   Alert and oriented. Pleasant and cooperative. Well-nourished and well-developed.  Head:  Normocephalic and atraumatic. Eyes:  Without icterus Abdomen:  +BS, soft, non-tender and non-distended. No HSM noted. No guarding or rebound. No masses appreciated.  Rectal:  Deferred  Msk:  Symmetrical without gross deformities. Normal posture. Extremities:  Without edema. Neurologic:  Alert and  oriented x4;  grossly normal neurologically. Skin:  Intact without significant lesions or rashes. Psych:  Alert and cooperative. Normal mood and affect.  Lab Results  Component Value Date   WBC 6.9 01/24/2024   HGB 10.2 (L) 01/24/2024   HCT 33.2 (L) 01/24/2024   MCV 121.6 (H) 01/24/2024   PLT 209 01/24/2024       Assessment   Megan [name] is a 63 y.o. female presenting today with a history of  internal hemorrhoids s/p banding in 2021 and April/May 2023, Cdiff in July 2022, anemia, HFpEF, recurrent breast cancer, GERD, HTN, ESRD on dialysis, CAD, HLD, and diabetes, recently with new onset dyspepsia Feb 2025  s/p EGD with nodular mucosa, followed by EUS in May s/p ligation of nodular lesions X 2 that were actively oozing.  She has also had issues with persistent rectal bleeding in setting of Grade 3 hemorrhoids. Here today to  follow-up after EUS.   GERD: currently on pantoprazole  daily with good control but recommend taking BID in light of EUS findings.   Abnormal nodular mucosa: on EGD Feb 2025 and negative for malignancy, EUS without malignant-appearing notes and s/p ligation of 2 nodular GAVE vs hyperplastic polyps that were eroded and active suction ooze. 1 larger lesions remains with plans for EUS and therapeutic intervention as needed in 3-4 months by Dr. Wilhelmenia. She is doing well following this with improving Hgb. Anemia multifactorial in setting of chronic disease, prior significant rectal bleeding with large internal hemorrhoids, nodular mucosa with oozing as noted.   Rectal bleeding: s/p banding in 2021 and 2023. Rectal exam prior visit in Feb 2025 without mass on DRE. We discussed updating colonoscopy especially in light of FH colon cancer. As she has upcoming lymph node biopsy, she would like to hold off on this. Last colonoscopy in 2022 with internal hemorrhoids. Last episode of rectal bleeding in May 2025.     PLAN    Continue PPI BID EUS in next few months with Dr. Wilhelmenia: their office to arrange. We appreciate this assistance with this nice lady.  Call if any further rectal bleeding Low threshold for updated colonoscopy due to FH colon cancer: patient  and husband desire to hold off and discuss in future due to multiple other health issues Return in Oct 2025.    Therisa MICAEL Stager, PhD, ANP-BC [facility]astroenterology

## 2024-02-13 DIAGNOSIS — N2581 Secondary hyperparathyroidism of renal origin: Secondary | ICD-10-CM | POA: Diagnosis not present

## 2024-02-13 DIAGNOSIS — Z992 Dependence on renal dialysis: Secondary | ICD-10-CM | POA: Diagnosis not present

## 2024-02-13 DIAGNOSIS — D631 Anemia in chronic kidney disease: Secondary | ICD-10-CM | POA: Diagnosis not present

## 2024-02-13 DIAGNOSIS — D509 Iron deficiency anemia, unspecified: Secondary | ICD-10-CM | POA: Diagnosis not present

## 2024-02-13 DIAGNOSIS — E1129 Type 2 diabetes mellitus with other diabetic kidney complication: Secondary | ICD-10-CM | POA: Diagnosis not present

## 2024-02-13 DIAGNOSIS — N186 End stage renal disease: Secondary | ICD-10-CM | POA: Diagnosis not present

## 2024-02-13 DIAGNOSIS — E119 Type 2 diabetes mellitus without complications: Secondary | ICD-10-CM | POA: Diagnosis not present

## 2024-02-14 ENCOUNTER — Other Ambulatory Visit (HOSPITAL_COMMUNITY)

## 2024-02-14 ENCOUNTER — Inpatient Hospital Stay

## 2024-02-15 DIAGNOSIS — E1129 Type 2 diabetes mellitus with other diabetic kidney complication: Secondary | ICD-10-CM | POA: Diagnosis not present

## 2024-02-15 DIAGNOSIS — Z992 Dependence on renal dialysis: Secondary | ICD-10-CM | POA: Diagnosis not present

## 2024-02-15 DIAGNOSIS — N2581 Secondary hyperparathyroidism of renal origin: Secondary | ICD-10-CM | POA: Diagnosis not present

## 2024-02-15 DIAGNOSIS — N186 End stage renal disease: Secondary | ICD-10-CM | POA: Diagnosis not present

## 2024-02-15 DIAGNOSIS — D631 Anemia in chronic kidney disease: Secondary | ICD-10-CM | POA: Diagnosis not present

## 2024-02-15 DIAGNOSIS — D509 Iron deficiency anemia, unspecified: Secondary | ICD-10-CM | POA: Diagnosis not present

## 2024-02-18 ENCOUNTER — Other Ambulatory Visit: Payer: Self-pay | Admitting: Family Medicine

## 2024-02-18 DIAGNOSIS — E1129 Type 2 diabetes mellitus with other diabetic kidney complication: Secondary | ICD-10-CM | POA: Diagnosis not present

## 2024-02-18 DIAGNOSIS — D509 Iron deficiency anemia, unspecified: Secondary | ICD-10-CM | POA: Diagnosis not present

## 2024-02-18 DIAGNOSIS — Z992 Dependence on renal dialysis: Secondary | ICD-10-CM | POA: Diagnosis not present

## 2024-02-18 DIAGNOSIS — D631 Anemia in chronic kidney disease: Secondary | ICD-10-CM | POA: Diagnosis not present

## 2024-02-18 DIAGNOSIS — N2581 Secondary hyperparathyroidism of renal origin: Secondary | ICD-10-CM | POA: Diagnosis not present

## 2024-02-18 DIAGNOSIS — N186 End stage renal disease: Secondary | ICD-10-CM | POA: Diagnosis not present

## 2024-02-20 DIAGNOSIS — D509 Iron deficiency anemia, unspecified: Secondary | ICD-10-CM | POA: Diagnosis not present

## 2024-02-20 DIAGNOSIS — D631 Anemia in chronic kidney disease: Secondary | ICD-10-CM | POA: Diagnosis not present

## 2024-02-20 DIAGNOSIS — E1129 Type 2 diabetes mellitus with other diabetic kidney complication: Secondary | ICD-10-CM | POA: Diagnosis not present

## 2024-02-20 DIAGNOSIS — N2581 Secondary hyperparathyroidism of renal origin: Secondary | ICD-10-CM | POA: Diagnosis not present

## 2024-02-20 DIAGNOSIS — Z992 Dependence on renal dialysis: Secondary | ICD-10-CM | POA: Diagnosis not present

## 2024-02-20 DIAGNOSIS — N186 End stage renal disease: Secondary | ICD-10-CM | POA: Diagnosis not present

## 2024-02-21 ENCOUNTER — Inpatient Hospital Stay: Admitting: Hematology

## 2024-02-22 DIAGNOSIS — N2581 Secondary hyperparathyroidism of renal origin: Secondary | ICD-10-CM | POA: Diagnosis not present

## 2024-02-22 DIAGNOSIS — D631 Anemia in chronic kidney disease: Secondary | ICD-10-CM | POA: Diagnosis not present

## 2024-02-22 DIAGNOSIS — N186 End stage renal disease: Secondary | ICD-10-CM | POA: Diagnosis not present

## 2024-02-22 DIAGNOSIS — D509 Iron deficiency anemia, unspecified: Secondary | ICD-10-CM | POA: Diagnosis not present

## 2024-02-22 DIAGNOSIS — E1129 Type 2 diabetes mellitus with other diabetic kidney complication: Secondary | ICD-10-CM | POA: Diagnosis not present

## 2024-02-22 DIAGNOSIS — Z992 Dependence on renal dialysis: Secondary | ICD-10-CM | POA: Diagnosis not present

## 2024-02-25 DIAGNOSIS — N2581 Secondary hyperparathyroidism of renal origin: Secondary | ICD-10-CM | POA: Diagnosis not present

## 2024-02-25 DIAGNOSIS — E1129 Type 2 diabetes mellitus with other diabetic kidney complication: Secondary | ICD-10-CM | POA: Diagnosis not present

## 2024-02-25 DIAGNOSIS — D631 Anemia in chronic kidney disease: Secondary | ICD-10-CM | POA: Diagnosis not present

## 2024-02-25 DIAGNOSIS — Z992 Dependence on renal dialysis: Secondary | ICD-10-CM | POA: Diagnosis not present

## 2024-02-25 DIAGNOSIS — D509 Iron deficiency anemia, unspecified: Secondary | ICD-10-CM | POA: Diagnosis not present

## 2024-02-25 DIAGNOSIS — N186 End stage renal disease: Secondary | ICD-10-CM | POA: Diagnosis not present

## 2024-02-26 ENCOUNTER — Ambulatory Visit (HOSPITAL_COMMUNITY)
Admission: RE | Admit: 2024-02-26 | Discharge: 2024-02-26 | Disposition: A | Discharge status: Home / Self Care | Source: Ambulatory Visit | Attending: Hematology | Admitting: Hematology

## 2024-02-26 DIAGNOSIS — C799 Secondary malignant neoplasm of unspecified site: Secondary | ICD-10-CM | POA: Diagnosis present

## 2024-02-26 DIAGNOSIS — C773 Secondary and unspecified malignant neoplasm of axilla and upper limb lymph nodes: Secondary | ICD-10-CM | POA: Diagnosis not present

## 2024-02-26 DIAGNOSIS — C50211 Malignant neoplasm of upper-inner quadrant of right female breast: Secondary | ICD-10-CM | POA: Diagnosis not present

## 2024-02-26 DIAGNOSIS — Z853 Personal history of malignant neoplasm of breast: Secondary | ICD-10-CM | POA: Diagnosis not present

## 2024-02-26 DIAGNOSIS — R591 Generalized enlarged lymph nodes: Secondary | ICD-10-CM | POA: Diagnosis not present

## 2024-02-26 MED ORDER — LIDOCAINE HCL (PF) 1 % IJ SOLN
6.0000 mL | Freq: Once | INTRAMUSCULAR | Status: AC
  Administered 2024-02-26: 6 mL via INTRADERMAL

## 2024-02-26 NOTE — Procedures (Signed)
 Interventional Radiology Procedure Note  Procedure: US  CORE BX LEFT AXILLARY ADENOPATHY    Complications: None  Estimated Blood Loss:  MIN  Findings: 18 G CORES IN SALINE     EMERSON FREDERIC SPECKING, MD

## 2024-02-27 DIAGNOSIS — D509 Iron deficiency anemia, unspecified: Secondary | ICD-10-CM | POA: Diagnosis not present

## 2024-02-27 DIAGNOSIS — Z992 Dependence on renal dialysis: Secondary | ICD-10-CM | POA: Diagnosis not present

## 2024-02-27 DIAGNOSIS — N186 End stage renal disease: Secondary | ICD-10-CM | POA: Diagnosis not present

## 2024-02-27 DIAGNOSIS — D631 Anemia in chronic kidney disease: Secondary | ICD-10-CM | POA: Diagnosis not present

## 2024-02-27 DIAGNOSIS — N2581 Secondary hyperparathyroidism of renal origin: Secondary | ICD-10-CM | POA: Diagnosis not present

## 2024-02-27 DIAGNOSIS — E1129 Type 2 diabetes mellitus with other diabetic kidney complication: Secondary | ICD-10-CM | POA: Diagnosis not present

## 2024-02-28 ENCOUNTER — Inpatient Hospital Stay: Attending: Hematology

## 2024-02-28 DIAGNOSIS — M79629 Pain in unspecified upper arm: Secondary | ICD-10-CM | POA: Insufficient documentation

## 2024-02-28 DIAGNOSIS — M8580 Other specified disorders of bone density and structure, unspecified site: Secondary | ICD-10-CM | POA: Diagnosis not present

## 2024-02-28 DIAGNOSIS — Z79899 Other long term (current) drug therapy: Secondary | ICD-10-CM | POA: Diagnosis not present

## 2024-02-28 DIAGNOSIS — C50211 Malignant neoplasm of upper-inner quadrant of right female breast: Secondary | ICD-10-CM | POA: Insufficient documentation

## 2024-02-28 DIAGNOSIS — N186 End stage renal disease: Secondary | ICD-10-CM | POA: Diagnosis not present

## 2024-02-28 DIAGNOSIS — Z9013 Acquired absence of bilateral breasts and nipples: Secondary | ICD-10-CM | POA: Insufficient documentation

## 2024-02-28 DIAGNOSIS — Z170 Estrogen receptor positive status [ER+]: Secondary | ICD-10-CM | POA: Diagnosis not present

## 2024-02-28 DIAGNOSIS — Z800 Family history of malignant neoplasm of digestive organs: Secondary | ICD-10-CM | POA: Insufficient documentation

## 2024-02-28 DIAGNOSIS — Z809 Family history of malignant neoplasm, unspecified: Secondary | ICD-10-CM | POA: Diagnosis not present

## 2024-02-28 DIAGNOSIS — G629 Polyneuropathy, unspecified: Secondary | ICD-10-CM | POA: Diagnosis not present

## 2024-02-28 DIAGNOSIS — R590 Localized enlarged lymph nodes: Secondary | ICD-10-CM | POA: Insufficient documentation

## 2024-02-28 DIAGNOSIS — C799 Secondary malignant neoplasm of unspecified site: Secondary | ICD-10-CM

## 2024-02-28 LAB — COMPREHENSIVE METABOLIC PANEL WITH GFR
ALT: 17 U/L (ref 0–44)
AST: 17 U/L (ref 15–41)
Albumin: 3.3 g/dL — ABNORMAL LOW (ref 3.5–5.0)
Alkaline Phosphatase: 87 U/L (ref 38–126)
Anion gap: 17 — ABNORMAL HIGH (ref 5–15)
BUN: 29 mg/dL — ABNORMAL HIGH (ref 8–23)
CO2: 28 mmol/L (ref 22–32)
Calcium: 9.8 mg/dL (ref 8.9–10.3)
Chloride: 94 mmol/L — ABNORMAL LOW (ref 98–111)
Creatinine, Ser: 9.71 mg/dL — ABNORMAL HIGH (ref 0.44–1.00)
GFR, Estimated: 4 mL/min — ABNORMAL LOW (ref >60)
Glucose, Bld: 101 mg/dL — ABNORMAL HIGH (ref 70–99)
Potassium: 4.8 mmol/L (ref 3.5–5.1)
Sodium: 139 mmol/L (ref 135–145)
Total Bilirubin: 0.6 mg/dL (ref 0.0–1.2)
Total Protein: 7.7 g/dL (ref 6.5–8.1)

## 2024-02-28 LAB — CBC WITH DIFFERENTIAL/PLATELET
Abs Immature Granulocytes: 0.02 K/uL (ref 0.00–0.07)
Basophils Absolute: 0.1 K/uL (ref 0.0–0.1)
Basophils Relative: 1 %
Eosinophils Absolute: 0.3 K/uL (ref 0.0–0.5)
Eosinophils Relative: 5 %
HCT: 34.4 % — ABNORMAL LOW (ref 36.0–46.0)
Hemoglobin: 10.6 g/dL — ABNORMAL LOW (ref 12.0–15.0)
Immature Granulocytes: 0 %
Lymphocytes Relative: 19 %
Lymphs Abs: 1.3 K/uL (ref 0.7–4.0)
MCH: 35.9 pg — ABNORMAL HIGH (ref 26.0–34.0)
MCHC: 30.8 g/dL (ref 30.0–36.0)
MCV: 116.6 fL — ABNORMAL HIGH (ref 80.0–100.0)
Monocytes Absolute: 0.8 K/uL (ref 0.1–1.0)
Monocytes Relative: 12 %
Neutro Abs: 4.1 K/uL (ref 1.7–7.7)
Neutrophils Relative %: 63 %
Platelets: 227 K/uL (ref 150–400)
RBC: 2.95 MIL/uL — ABNORMAL LOW (ref 3.87–5.11)
RDW: 15.1 % (ref 11.5–15.5)
Smear Review: NORMAL
WBC: 6.5 K/uL (ref 4.0–10.5)
nRBC: 0 % (ref 0.0–0.2)

## 2024-02-28 LAB — SAMPLE TO BLOOD BANK

## 2024-02-28 LAB — MAGNESIUM: Magnesium: 2.2 mg/dL (ref 1.7–2.4)

## 2024-02-29 DIAGNOSIS — D509 Iron deficiency anemia, unspecified: Secondary | ICD-10-CM | POA: Diagnosis not present

## 2024-02-29 DIAGNOSIS — E1129 Type 2 diabetes mellitus with other diabetic kidney complication: Secondary | ICD-10-CM | POA: Diagnosis not present

## 2024-02-29 DIAGNOSIS — Z992 Dependence on renal dialysis: Secondary | ICD-10-CM | POA: Diagnosis not present

## 2024-02-29 DIAGNOSIS — N2581 Secondary hyperparathyroidism of renal origin: Secondary | ICD-10-CM | POA: Diagnosis not present

## 2024-02-29 DIAGNOSIS — D631 Anemia in chronic kidney disease: Secondary | ICD-10-CM | POA: Diagnosis not present

## 2024-02-29 DIAGNOSIS — N186 End stage renal disease: Secondary | ICD-10-CM | POA: Diagnosis not present

## 2024-02-29 LAB — SURGICAL PATHOLOGY

## 2024-03-03 ENCOUNTER — Encounter (HOSPITAL_COMMUNITY): Payer: Self-pay | Admitting: Emergency Medicine

## 2024-03-03 ENCOUNTER — Ambulatory Visit: Payer: Self-pay

## 2024-03-03 ENCOUNTER — Other Ambulatory Visit: Payer: Self-pay

## 2024-03-03 ENCOUNTER — Emergency Department (HOSPITAL_COMMUNITY)
Admission: EM | Admit: 2024-03-03 | Discharge: 2024-03-03 | Disposition: A | Discharge status: Home / Self Care | Source: Ambulatory Visit | Attending: Emergency Medicine | Admitting: Emergency Medicine

## 2024-03-03 ENCOUNTER — Emergency Department (HOSPITAL_COMMUNITY)

## 2024-03-03 DIAGNOSIS — Z7982 Long term (current) use of aspirin: Secondary | ICD-10-CM | POA: Diagnosis not present

## 2024-03-03 DIAGNOSIS — Z992 Dependence on renal dialysis: Secondary | ICD-10-CM | POA: Insufficient documentation

## 2024-03-03 DIAGNOSIS — I2510 Atherosclerotic heart disease of native coronary artery without angina pectoris: Secondary | ICD-10-CM | POA: Diagnosis not present

## 2024-03-03 DIAGNOSIS — Z853 Personal history of malignant neoplasm of breast: Secondary | ICD-10-CM | POA: Insufficient documentation

## 2024-03-03 DIAGNOSIS — N186 End stage renal disease: Secondary | ICD-10-CM | POA: Diagnosis not present

## 2024-03-03 DIAGNOSIS — E1129 Type 2 diabetes mellitus with other diabetic kidney complication: Secondary | ICD-10-CM | POA: Diagnosis not present

## 2024-03-03 DIAGNOSIS — N2581 Secondary hyperparathyroidism of renal origin: Secondary | ICD-10-CM | POA: Diagnosis not present

## 2024-03-03 DIAGNOSIS — D631 Anemia in chronic kidney disease: Secondary | ICD-10-CM | POA: Diagnosis not present

## 2024-03-03 DIAGNOSIS — E1122 Type 2 diabetes mellitus with diabetic chronic kidney disease: Secondary | ICD-10-CM | POA: Diagnosis not present

## 2024-03-03 DIAGNOSIS — R1032 Left lower quadrant pain: Secondary | ICD-10-CM | POA: Insufficient documentation

## 2024-03-03 DIAGNOSIS — D509 Iron deficiency anemia, unspecified: Secondary | ICD-10-CM | POA: Diagnosis not present

## 2024-03-03 LAB — CBC WITH DIFFERENTIAL/PLATELET
Abs Immature Granulocytes: 0.05 K/uL (ref 0.00–0.07)
Basophils Absolute: 0.1 K/uL (ref 0.0–0.1)
Basophils Relative: 1 %
Eosinophils Absolute: 0.3 K/uL (ref 0.0–0.5)
Eosinophils Relative: 3 %
HCT: 36 % (ref 36.0–46.0)
Hemoglobin: 11.3 g/dL — ABNORMAL LOW (ref 12.0–15.0)
Immature Granulocytes: 1 %
Lymphocytes Relative: 13 %
Lymphs Abs: 1 K/uL (ref 0.7–4.0)
MCH: 35.6 pg — ABNORMAL HIGH (ref 26.0–34.0)
MCHC: 31.4 g/dL (ref 30.0–36.0)
MCV: 113.6 fL — ABNORMAL HIGH (ref 80.0–100.0)
Monocytes Absolute: 1 K/uL (ref 0.1–1.0)
Monocytes Relative: 13 %
Neutro Abs: 5.2 K/uL (ref 1.7–7.7)
Neutrophils Relative %: 69 %
Platelets: 216 K/uL (ref 150–400)
RBC: 3.17 MIL/uL — ABNORMAL LOW (ref 3.87–5.11)
RDW: 15.1 % (ref 11.5–15.5)
Smear Review: NORMAL
WBC: 7.5 K/uL (ref 4.0–10.5)
nRBC: 0.3 % — ABNORMAL HIGH (ref 0.0–0.2)

## 2024-03-03 LAB — COMPREHENSIVE METABOLIC PANEL WITH GFR
ALT: 16 U/L (ref 0–44)
AST: 17 U/L (ref 15–41)
Albumin: 3.4 g/dL — ABNORMAL LOW (ref 3.5–5.0)
Alkaline Phosphatase: 120 U/L (ref 38–126)
Anion gap: 14 (ref 5–15)
BUN: 22 mg/dL (ref 8–23)
CO2: 32 mmol/L (ref 22–32)
Calcium: 8.6 mg/dL — ABNORMAL LOW (ref 8.9–10.3)
Chloride: 90 mmol/L — ABNORMAL LOW (ref 98–111)
Creatinine, Ser: 6.89 mg/dL — ABNORMAL HIGH (ref 0.44–1.00)
GFR, Estimated: 6 mL/min — ABNORMAL LOW (ref >60)
Glucose, Bld: 105 mg/dL — ABNORMAL HIGH (ref 70–99)
Potassium: 3.5 mmol/L (ref 3.5–5.1)
Sodium: 136 mmol/L (ref 135–145)
Total Bilirubin: 0.4 mg/dL (ref 0.0–1.2)
Total Protein: 8 g/dL (ref 6.5–8.1)

## 2024-03-03 LAB — LIPASE, BLOOD: Lipase: 33 U/L (ref 11–51)

## 2024-03-03 MED ORDER — IOHEXOL 300 MG/ML  SOLN
100.0000 mL | Freq: Once | INTRAMUSCULAR | Status: AC | PRN
  Administered 2024-03-03: 100 mL via INTRAVENOUS

## 2024-03-03 MED ORDER — HYDROCODONE-ACETAMINOPHEN 5-325 MG PO TABS: 1.0000 | ORAL_TABLET | Freq: Four times a day (QID) | ORAL | 0 refills | Status: DC | PRN

## 2024-03-03 MED ORDER — ONDANSETRON HCL 4 MG/2ML IJ SOLN
4.0000 mg | Freq: Once | INTRAMUSCULAR | Status: AC
  Administered 2024-03-03: 4 mg via INTRAVENOUS
  Filled 2024-03-03: qty 2

## 2024-03-03 MED ORDER — FENTANYL CITRATE (PF) 100 MCG/2ML IJ SOLN
50.0000 ug | Freq: Once | INTRAMUSCULAR | Status: AC
  Administered 2024-03-03: 50 ug via INTRAVENOUS
  Filled 2024-03-03: qty 2

## 2024-03-03 MED ORDER — ONDANSETRON HCL 4 MG PO TABS: 4.0000 mg | ORAL_TABLET | Freq: Four times a day (QID) | ORAL | 0 refills | Status: DC

## 2024-03-03 NOTE — Telephone Encounter (Signed)
 This RN contacted patient. Patient is in ED now. Will call back with any needs.

## 2024-03-03 NOTE — Telephone Encounter (Signed)
 FYI Only or Action Required?: FYI only for provider.  Patient was last seen in primary care on 01/28/2024 by Duanne Butler DASEN, MD.  Called Nurse Triage reporting Flank Pain.  Symptoms began several days ago.  Interventions attempted: Nothing.  Symptoms are: gradually worsening.  Triage Disposition: No disposition on file.  Patient/caregiver understands and will follow disposition?:   To ER                          Copied from CRM [id number]. Topic: Clinical - Red Word Triage >> Mar 03, 2024  9:10 AM Marissa P wrote: Red Word that prompted transfer to Nurse Triage: Patient called whole left side down below belly, lots of pain, started 6 days ago and today hurting really bad Reason for Disposition  [1] Sudden onset of severe flank pain AND [2] age > 60 years  Answer Assessment - Initial Assessment Questions 1. LOCATION: Where does it hurt? (e.g., left, right)     Left side 2. ONSET: When did the pain start?     6 days ago 3. SEVERITY: How bad is the pain? (e.g., Scale 1-10; mild, moderate, or severe)     severe 4. PATTERN: Does the pain come and go, or is it constant?      Comes and goes 5. CAUSE: What do you think is causing the pain?     no 6. OTHER SYMPTOMS:  Do you have any other symptoms? (e.g., fever, abdomen pain, vomiting, leg weakness, burning with urination, blood in urine)     nausea 7. PREGNANCY:  Is there any chance you are pregnant? When was your last menstrual period?     na  Protocols used: Flank Pain-A-AH

## 2024-03-03 NOTE — Discharge Instructions (Addendum)
 Your lab tests today are all reassuring, your CT scan does not show any acute reasons for this pain.  I do recommend close follow-up with your primary provider if your symptoms persist and of course follow-up with Dr. Katragadda as you have already arranged.  I have prescribed you small quantity of pain medication and nausea medication if needed for return of your pain.  Do not drive within 4 hours of taking hydrocodone  as this will make you drowsy.

## 2024-03-03 NOTE — Telephone Encounter (Signed)
 Pt disconnected with [facility] before NT transfer, I called pt back. LVMTCB to discuss sx.   Copied from CRM [id number]. Topic: Clinical - Red Word Triage >> Mar 03, 2024  8:39 AM Larissa RAMAN wrote: [facility] that prompted transfer to Nurse Triage:LT side pain x 5 days

## 2024-03-03 NOTE — ED Notes (Signed)
 Patient transported to CT

## 2024-03-03 NOTE — ED Provider Notes (Signed)
 [place] EMERGENCY DEPARTMENT AT [facility] Provider Note   CSN: [id number] Arrival date & time: 03/03/24  9071     Patient presents with: Abdominal Pain   Megan [name] is a 63 y.o. female with a history including end-stage renal disease on dialysis, completed today's treatment, GERD, right breast cancer with recent concerns of possible recurrence, type 2 diabetes, nonobstructive CAD, significant surgical history including abdominal hysterectomy presenting for evaluation of left lower quadrant pain.  She describes a 10-day history of intermittent left lower quadrant pain along with nausea without emesis, diarrhea, dysuria or flank pain.  Her pain is become more intense over the past several days, sharp and stabbing and now constant, not worsened with movement nor has she found any alleviators.  She has had screening colonoscopies in the past, never told she has diverticulosis.  Denies constipation, last bowel movement was yesterday.  {Add pertinent medical, surgical, social history, OB history to YEP:[id number]} The history is provided by the patient.       Prior to Admission medications   Medication Sig Start Date End Date Taking? Authorizing Provider  HYDROcodone -acetaminophen  (NORCO/VICODIN) 5-325 MG tablet Take 1 tablet by mouth every 6 (six) hours as needed. 03/03/24  Yes Jerilyn Gillaspie, Mliss, PA-C  ondansetron  (ZOFRAN ) 4 MG tablet Take 1 tablet (4 mg total) by mouth every 6 (six) hours. 03/03/24  Yes Yuliya Nova, PA-C  amLODipine  (NORVASC ) 5 MG tablet Take 5 mg by mouth at bedtime.    [provider]  aspirin  EC 81 MG tablet Take 81 mg by mouth daily.    [provider]  atorvastatin  (LIPITOR) 20 MG tablet TAKE 1 TABLET(20 MG) BY MOUTH DAILY 02/11/24   Duanne Butler DASEN, MD  B Complex-C-Zn-Folic Acid  (DIALYVITE  800-ZINC  15) 0.8 MG TABS Take 1 tablet by mouth daily. 06/19/22   [provider]  cinacalcet  (SENSIPAR ) 60 MG tablet Take 60 mg by mouth daily.     [provider]  Darbepoetin Alfa  (ARANESP , ALBUMIN FREE, IJ) Darbepoetin Alfa  (Aranesp ) 07/06/23 07/04/24  [provider]  fluticasone  (FLONASE ) 50 MCG/ACT nasal spray Place 1 spray into both nostrils daily.    [provider]  gabapentin  (NEURONTIN ) 300 MG capsule TAKE 1 CAPSULE(300 MG) BY MOUTH AT BEDTIME 06/12/23   Duanne Butler DASEN, MD  iron  sucrose (VENOFER ) 20 MG/ML injection Inject 50 mg into the vein once a week. 02/19/23 02/18/24  [provider]  lanthanum  (FOSRENOL ) 1000 MG chewable tablet Chew 2,000-3,000 mg by mouth See admin instructions. Take 3 tablets (3000 mg) by mouth with meals and take 2 tablets (2000 mg) with snacks 10/16/19   [provider]  lidocaine -prilocaine  (EMLA ) cream APPLY TOPICALLY TO THE AFFECTED AREA 1 TIME 10/01/23   Rogers Hai, MD  meclizine  (ANTIVERT ) 25 MG tablet Take 1 tablet (25 mg total) by mouth 3 (three) times daily as needed for dizziness. 06/12/23   Duanne Butler DASEN, MD  nebivolol  (BYSTOLIC ) 10 MG tablet TAKE 1 TABLET BY MOUTH IN THE MORNING AND AT BEDTIME 02/19/24   Duanne Butler DASEN, MD  nitroGLYCERIN  (NITROSTAT ) 0.4 MG SL tablet Place 1 tablet (0.4 mg total) under the tongue every 5 (five) minutes x 3 doses as needed for chest pain (if no relief after 3rd dose, proceed to ED or call 911). 09/13/22   Alvan Dorn FALCON, MD  oxyCODONE  (ROXICODONE ) 5 MG immediate release tablet Take 1 tablet (5 mg total) by mouth every 4 (four) hours as needed. 11/29/23 11/28/24  Mavis Anes, MD  pantoprazole  (PROTONIX ) 40 MG tablet Take 1 tablet (40 mg total) by mouth 2 (two) times daily before a meal. Patient taking differently: Take 40 mg by mouth daily. 10/08/23   Rourk, Lamar HERO, MD    Allergies: Motrin [ibuprofen]    Review of Systems  Constitutional:  Negative for fever.  HENT:  Negative for congestion and sore throat.   Eyes: Negative.   Respiratory:  Negative for chest tightness and shortness of breath.    Cardiovascular:  Negative for chest pain.  Gastrointestinal:  Positive for abdominal pain and nausea. Negative for diarrhea and vomiting.  Genitourinary: Negative.  Negative for dysuria.  Musculoskeletal:  Negative for arthralgias, joint swelling and neck pain.  Skin: Negative.  Negative for rash and wound.  Neurological:  Negative for dizziness, weakness, light-headedness, numbness and headaches.  Psychiatric/Behavioral: Negative.      Updated Vital Signs BP (!) 147/83   Pulse 76   Temp 98 F (36.7 C) (Oral)   Resp 16   Ht 5' 1 (1.549 m)   Wt 80.3 kg   SpO2 94%   BMI 33.44 kg/m   Physical Exam Vitals and nursing note reviewed.  Constitutional:      Appearance: She is well-developed.  HENT:     Head: Normocephalic and atraumatic.  Eyes:     Conjunctiva/sclera: Conjunctivae normal.  Cardiovascular:     Rate and Rhythm: Normal rate and regular rhythm.     Heart sounds: Normal heart sounds.  Pulmonary:     Effort: Pulmonary effort is normal.     Breath sounds: Normal breath sounds. No wheezing.  Abdominal:     General: Bowel sounds are normal.     Palpations: Abdomen is soft.     Tenderness: There is abdominal tenderness in the left lower quadrant. There is no guarding or rebound.  Musculoskeletal:        General: Normal range of motion.     Cervical back: Normal range of motion.  Skin:    General: Skin is warm and dry.  Neurological:     Mental Status: She is alert.     (all labs ordered are listed, but only abnormal results are displayed) Labs Reviewed  CBC WITH DIFFERENTIAL/PLATELET - Abnormal; Notable for the following components:      Result Value   RBC 3.17 (*)    Hemoglobin 11.3 (*)    MCV 113.6 (*)    MCH 35.6 (*)    nRBC 0.3 (*)    All other components within normal limits  COMPREHENSIVE METABOLIC PANEL WITH GFR - Abnormal; Notable for the following components:   Chloride 90 (*)    Glucose, Bld 105 (*)    Creatinine, Ser 6.89 (*)    Calcium  8.6  (*)    Albumin 3.4 (*)    GFR, Estimated 6 (*)    All other components within normal limits  LIPASE, BLOOD  URINALYSIS, ROUTINE W REFLEX MICROSCOPIC    EKG: None  Radiology: CT ABDOMEN PELVIS W CONTRAST Result Date: 03/03/2024 CLINICAL DATA:  Left lower quadrant pain with nausea. Breast cancer. * Tracking Code: BO * EXAM: CT ABDOMEN AND PELVIS WITH CONTRAST TECHNIQUE: Multidetector CT imaging of the abdomen and pelvis was performed using the standard protocol following bolus administration of intravenous contrast. RADIATION DOSE REDUCTION: This exam was performed according to the departmental dose-optimization program which includes automated exposure control, adjustment of the mA and/or kV according to patient size and/or use of iterative reconstruction technique. CONTRAST:  100mL OMNIPAQUE   IOHEXOL  300 MG/ML  SOLN COMPARISON:  PET 01/31/2024, CT chest 01/24/2024 and CT chest abdomen pelvis 11/07/2023. 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 4 mm medial right lower lobe nodule (3/7) and 3 mm anterolateral right lower lobe nodule (3/5) appear new from 01/24/2024. No pleural fluid. Atherosclerotic calcification of the aorta with age advanced involvement of the coronary arteries. Heart is enlarged. No pericardial or pleural effusion. Distal esophagus is grossly unremarkable. Hepatobiliary: Liver is enlarged, 18.3 cm. Cholecystectomy. No biliary ductal dilatation. Pancreas: Negative. Spleen: Negative. Adrenals/Urinary Tract: Adrenal glands are unremarkable. Kidneys are atrophic and contain multiple low and high attenuation lesions, difficult to further characterize due to size. Multiple bilateral renal stones. Ureters are decompressed. Bladder is very low in volume. Stomach/Bowel: Stomach, small bowel and colon are unremarkable. Appendix is not readily visualized. Vascular/Lymphatic: Atherosclerotic calcification of the aorta. No pathologically enlarged lymph nodes.  Reproductive: Hysterectomy.  No adnexal mass. Other: No free fluid.  Mesenteries and peritoneum are unremarkable. Musculoskeletal: No worrisome lytic or sclerotic lesions. IMPRESSION: 1. No findings to explain the patient's pain. 2. New right lower lobe nodules worrisome for metastatic disease. 3. Age advanced coronary artery calcification. 4. Mild hepatomegaly. 5. Multiple bilateral renal stones. 6. High and low-attenuation lesions in the kidneys are difficult to further characterize due to size and lack of precontrast imaging. Further evaluation with pre and post contrast MRI should be considered. Pre and post contrast CT could alternatively be performed, but would likely be of decreased accuracy given lesion size. Aortic atherosclerosis (ICD10-I70.0). Electronically Signed   By: Newell Eke M.D.   On: 03/03/2024 12:48    {Document cardiac monitor, telemetry assessment procedure when appropriate:32947} Procedures   Medications Ordered in the ED  ondansetron  (ZOFRAN ) injection 4 mg (4 mg Intravenous Given 03/03/24 1157)  fentaNYL  (SUBLIMAZE ) injection 50 mcg (50 mcg Intravenous Given 03/03/24 1158)  iohexol  (OMNIPAQUE ) 300 MG/ML solution 100 mL (100 mLs Intravenous Contrast Given 03/03/24 1213)  fentaNYL  (SUBLIMAZE ) injection 50 mcg (50 mcg Intravenous Given 03/03/24 1550)      {Click here for ABCD2, HEART and other calculators REFRESH Note before signing:1}                              Medical Decision Making Amount and/or Complexity of Data Reviewed Labs: ordered. Radiology: ordered.  Risk Prescription drug management.     {Document critical care time when appropriate  Document review of labs and clinical decision tools ie CHADS2VASC2, etc  Document your independent review of radiology images and any outside records  Document your discussion with family members, caretakers and with consultants  Document social determinants of health affecting pt's care  Document your decision  making why or why not admission, treatments were needed:32947:::1}   Final diagnoses:  Left lower quadrant abdominal pain    ED Discharge Orders          Ordered    HYDROcodone -acetaminophen  (NORCO/VICODIN) 5-325 MG tablet  Every 6 hours PRN        03/03/24 1602    ondansetron  (ZOFRAN ) 4 MG tablet  Every 6 hours        03/03/24 1602

## 2024-03-03 NOTE — ED Triage Notes (Signed)
 Pt to the ED with complaints of LLQ abdominal pain that began 6 days ago with nausea.  Denies vomiting and diarrhea.

## 2024-03-05 DIAGNOSIS — D509 Iron deficiency anemia, unspecified: Secondary | ICD-10-CM | POA: Diagnosis not present

## 2024-03-05 DIAGNOSIS — E1129 Type 2 diabetes mellitus with other diabetic kidney complication: Secondary | ICD-10-CM | POA: Diagnosis not present

## 2024-03-05 DIAGNOSIS — D631 Anemia in chronic kidney disease: Secondary | ICD-10-CM | POA: Diagnosis not present

## 2024-03-05 DIAGNOSIS — N2581 Secondary hyperparathyroidism of renal origin: Secondary | ICD-10-CM | POA: Diagnosis not present

## 2024-03-05 DIAGNOSIS — N186 End stage renal disease: Secondary | ICD-10-CM | POA: Diagnosis not present

## 2024-03-05 DIAGNOSIS — Z992 Dependence on renal dialysis: Secondary | ICD-10-CM | POA: Diagnosis not present

## 2024-03-06 ENCOUNTER — Inpatient Hospital Stay

## 2024-03-06 ENCOUNTER — Inpatient Hospital Stay (HOSPITAL_BASED_OUTPATIENT_CLINIC_OR_DEPARTMENT_OTHER): Admitting: Hematology

## 2024-03-06 VITALS — BP 135/65 | HR 83 | Temp 98.3°F | Resp 16 | Wt 182.8 lb

## 2024-03-06 DIAGNOSIS — C799 Secondary malignant neoplasm of unspecified site: Secondary | ICD-10-CM

## 2024-03-06 DIAGNOSIS — C50111 Malignant neoplasm of central portion of right female breast: Secondary | ICD-10-CM

## 2024-03-06 DIAGNOSIS — Z171 Estrogen receptor negative status [ER-]: Secondary | ICD-10-CM

## 2024-03-06 DIAGNOSIS — C7989 Secondary malignant neoplasm of other specified sites: Secondary | ICD-10-CM | POA: Diagnosis not present

## 2024-03-06 DIAGNOSIS — C50211 Malignant neoplasm of upper-inner quadrant of right female breast: Secondary | ICD-10-CM | POA: Diagnosis not present

## 2024-03-06 DIAGNOSIS — M79629 Pain in unspecified upper arm: Secondary | ICD-10-CM | POA: Diagnosis not present

## 2024-03-06 DIAGNOSIS — M8580 Other specified disorders of bone density and structure, unspecified site: Secondary | ICD-10-CM | POA: Diagnosis not present

## 2024-03-06 DIAGNOSIS — R590 Localized enlarged lymph nodes: Secondary | ICD-10-CM | POA: Diagnosis not present

## 2024-03-06 DIAGNOSIS — Z9013 Acquired absence of bilateral breasts and nipples: Secondary | ICD-10-CM | POA: Diagnosis not present

## 2024-03-06 DIAGNOSIS — Z170 Estrogen receptor positive status [ER+]: Secondary | ICD-10-CM | POA: Diagnosis not present

## 2024-03-06 LAB — GENETIC SCREENING ORDER

## 2024-03-06 NOTE — Patient Instructions (Signed)
 [facility] at [facility] Discharge Instructions   You were seen and examined today by Dr. Rogers.  He reviewed the results of your biopsy. 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   We will obtain genetic testing today. We will also send molecular testing on the biopsy tissue to see if there are any mutations to target to guide treatment.   We will need to arrange for you to have another port placed in order to administer treatment.    Return as scheduled.    Thank you for choosing [facility] at [facility] to provide your oncology and hematology care.  To afford each patient quality time with our provider, please arrive at least 15 minutes before your scheduled appointment time.   If you have a lab appointment with the Cancer Center please come in thru the Main Entrance and check in at the main information desk.  You need to re-schedule your appointment should you arrive 10 or more minutes late.  We strive to give you quality time with our providers, and arriving late affects you and other patients whose appointments are after yours.  Also, if you no show three or more times for appointments you may be dismissed from the clinic at the providers discretion.     Again, thank you for choosing [facility].  Our hope is that these requests will decrease the amount of time that you wait before being seen by our physicians.       _____________________________________________________________  Should you have questions after your visit to [facility], please contact our office at [phone] and follow the prompts.  Our office hours are 8:00 a.m. and 4:30 p.m. Monday - Friday.  Please note that voicemails left after 4:00 p.m. may not be returned until the following business day.  We are closed weekends and major holidays.  You do have access to a nurse 24-7, just call the main number to the clinic  [phone] and do not press any options, hold on the line and a nurse will answer the phone.    For prescription refill requests, have your pharmacy contact our office and allow 72 hours.    Due to Covid, you will need to wear a mask upon entering the hospital. If you do not have a mask, a mask will be given to you at the Main Entrance upon arrival. For doctor visits, patients may have 1 support person age 68 or older with them. For treatment visits, patients can not have anyone with them due to social distancing guidelines and our immunocompromised population.

## 2024-03-06 NOTE — Progress Notes (Signed)
 [facility] [address] [place] [address]    Clinic Day:  03/06/24   Referring physician: Duanne Butler DASEN, Megan  Patient Care Team: Duanne Butler DASEN, Megan Megan PCP - General (Family Medicine) Alvan Dorn FALCON, Megan Megan PCP - Cardiology (Cardiology) Shaaron Lamar HERO, Megan (Gastroenterology) Debera Jayson MATSU, Megan Megan Consulting Physician (Cardiology) Rogers Hai, Megan Megan Medical Oncologist (Medical Oncology) Rayburn Pac, Megan Megan Consulting Physician (Nephrology)   ASSESSMENT & PLAN:   Assessment: 1.  Stage Ib (T1CN0) right breast cancer, ER positive, PR and HER-2 negative: -Right lumpectomy and sentinel lymph node biopsy on 09/18/2018 shows 1.5 cm IDC, grade 3, associated with high-grade DCIS, negative margins, 0/3 lymph nodes positive, ER 50%, PR negative and HER-2 negative, Ki-67 60%. -Oncotype DX recurrence score 47.  Distant recurrence at 9 years with tamoxifen alone is 36%.  Absolute chemotherapy benefit is more than 15%. -Adjuvant chemotherapy with 4 cycles of AC from 10/30/2018 through 01/01/2019. -She was started on anastrozole  in June 2020. -Mammogram on 09/02/2019 showed calcifications in the posterior aspect of upper outer quadrant of the right breast. -Right partial mastectomy on 02/18/2020 shows high-grade DCIS, 1.5 cm, no invasive carcinoma, resection margins negative.  0/11 lymph nodes.  ER 30% positive, PR negative. -Left simple mastectomy on 06/15/2020 was benign.  Right breast biopsy shows microscopic focus of invasive ductal carcinoma in the background of extensive high-grade DCIS. -Right breast lumpectomy on 07/13/2020 shows multifocal invasive ductal carcinoma, grade 3, largest measuring 1.2 cm.  Extensive high-grade DCIS with necrosis.  Margins are free.  PT1CNX.  There are multiple foci of invasive carcinoma arising from extensive DCIS.  Several small foci of invasive tumor arising from DCIS indicating new primaries.  ER/PR/HER-2 is negative.   Ki-67 is 20%. -PET scan on 09/14/2020 shows areas of nodularity with discrete nodule in the fat of the inferior chest wall within the subcutaneous tissues. -Ultrasound of the right chest wall shows masslike abnormality in the upper inner aspect of the right anterior chest at 1 o'clock position measuring 4.1 cm in greatest dimension.  1.7 cm hypoechoic mass in the central anterior right chest concerning for malignancy.  Possible borderline enlarged residual right axillary lymph node. -Right breast biopsy on 10/12/2020 shows 1.3 cm invasive ductal carcinoma, focally 0.1 cm from superior margin.  DCIS is less than 0.1 cm from superior margin.  PT1CPNX. - Right mastectomy followed by radiation therapy completed in May 2022. - PET scan (11/30/2022): Recurrent/metastatic disease within the right medial pectoralis musculature. - Biopsy (12/19/2022): Metastatic breast adenocarcinoma.  ER positive, 40%, weak staining.  PR negative.  Ki-67 30%.  HER2 IHC 0. - Right chest wall mass resection on 01/18/2023.  Pathology shows 1.7 cm IDC, grade 3.  DCIS with high-grade with necrosis.  Carcinoma involves inked inferior margin with focally invading into atrophic skeletal muscle. - Her case was discussed at multidisciplinary clinic at [facility] on 04/18/2023. - Surgical oncology felt that resection margins are adequate and recommended radiation therapy.  She was also evaluated by medical oncology (Dr. [name]) who has recommended systemic therapy with 6 cycles of docetaxel , carboplatin  and pembrolizumab . - XRT from 05/07/2023 through 05/25/2023. - 6 cycles of carboplatin , docetaxel  and pembrolizumab  from 06/14/2023 through 09/27/2023  2.  Osteopenia: -Bone density on 01/20/2019 shows T score -1.9. - DEXA scan (03/24/2021) T score -2.4  3.  ESRD on HD: - She is on HD on Monday/Wednesday/Friday under the direction of Dr. Rayburn.  4. Family  History: -Brother had colon cancer. Paternal aunt had cancer, type unknown.      Plan: 1.  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 - She never had left breast cancer in the past.  She had left simple mastectomy. - Left axillary biopsy on 02/26/2024 showed metastatic carcinoma, ER/PR negative, HER2 (0).  Ki-67 was 20%. - She has developed this lymphadenopathy on a scan on 01/24/2024.  She has completed last cycle of carboplatin , docetaxel , pembrolizumab  on 09/27/2023. - I Megan recommended genetic testing. - I also recommended NGS testing, with the early reporting of PD-L1 CPS. - I would not recommend repeating carboplatin  and taxane.  Other option includes sacituzumab govitecan.  Unfortunately it is not studied in people with dialysis although this is metabolized by liver.  I will reach out to experts in the field to see if they had any experience using it in this setting.  I Megan also reviewed data from [employer]ENT 04 trial which showed adding pembrolizumab  to Sacituzumab goatee can in patients with PD-L1 positive TNBC has improved survival compared to chemotherapy and pembrolizumab . - We would recommend port placement.   2.  Peripheral neuropathy: - She has tingling in the fingertips, right more than left.  Occasional pain in the feet present.  She is taking gabapentin  300 mg at bedtime.   3.  Right chest wall/upper arm pain: - She uses lidocaine  gel.  She takes Percocet 5/325 1 to 2 tablets Megan needed.     Orders Placed This Encounter  Procedures   IR IMAGING GUIDED PORT INSERTION    Standing Status:   Future    Expected Date:   03/13/2024    Expiration Date:   03/06/2025    Reason for Exam (SYMPTOM  OR DIAGNOSIS REQUIRED):   chemotherapy administration    Preferred Imaging Location?:   [facility]   Genetic Screening Order    Standing Status:   Future    Number of  Occurrences:   1    Expected Date:   03/06/2024    Expiration Date:   03/06/2025       Megan [name],Megan Megan a scribe for [name], Megan.,Megan documented all relevant documentation on the behalf of [name], Megan,Megan directed by  [name], Megan while in the presence of [name], Megan.  I, [name] Megan, Megan reviewed the above documentation for accuracy and completeness, and I agree with the above.      [name], Megan   7/24/20253:36 PM  CHIEF COMPLAINT:   Diagnosis: recurrent right breast cancer    Cancer Staging  Malignant neoplasm of upper-inner quadrant of right female breast [facility]) Staging form: Breast, AJCC 8th Edition - Clinical stage from 09/26/2018: Stage IB (cT1c, cN0, cM0, G3, ER+, PR-, HER2-) - Signed by [name] Alean, Megan on 09/26/2018    Prior Therapy: 1. Right lumpectomy and sentinel lymph node biopsy on 09/18/2018  2. Adjuvant chemotherapy with 4 cycles of AC from 10/30/2018 through 01/01/2019  3. Right partial mastectomy on 02/18/2020  4. Right breast lumpectomy on 07/13/2020  5. Right mastectomy followed by radiation therapy completed in May 2022 6.  Anastrozole  started in June 2020 7.  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 female presenting to clinic today for follow up of recurrent right breast cancer. She was last seen by me on 02/06/2024.  Since her last visit, she underwent biopsy of her left axillary lymph node on 02/26/2024. Pathology of the biopsy revealed: Metastatic carcinoma involving a lymph node, compatible with patient's  clinical history of primary breast carcinoma. The tumor cells are ER negative (0), PR negative (0), and Her2 negative (0+). Ki-67 is 20%.  Megan [name] had CT AP done on 03/03/2024 after an ER visit for LLQ pain that found: No findings to explain the patient's pain. New right lower lobe nodules worrisome for metastatic disease. Age advanced coronary artery calcification. Mild hepatomegaly. Multiple bilateral renal stones. High and low-attenuation lesions in the kidneys are difficult to further characterize due to size and lack of precontrast imaging.  Today, she states that she is doing well overall. Her appetite level is at 75%. Her energy level is at 75%. Megan [name] is accompanied by her husband and sister.   She reports a burning pain at the port removal site.   Her neuropathy is stable in the form of tingling in the fingers, worse on the right than the left. She also notes numbness and pain on the soles of the feet, that occasionally make it difficult to walk. Megan [name] is taking gabapentin  at night for symptoms.   She denies any loss of consciousness during dialysis since her last visit with me.   Megan [name] reports right chest pain that occasionally radiates down the right arm. When pain is severe, she is unable to move her right arm. She is using lidocaine  gel that mildly improve pain. Aishi intermittently uses Percocet when pain is severe.   Megan [name] notes swelling in left axillary area, where biopsy and metastatic cancer  is.   PAST MEDICAL HISTORY:   Past Medical History: Past Medical History:  Diagnosis Date   (HFpEF) heart failure with preserved ejection fraction (HCC)    a. 01/2019 Echo: EF 55-60%, mild conc LVH. DD.  Torn MV chordae.   Anemia    Atypical chest pain    a. 08/2018 MV: EF 59%, no ischemia; b. 02/2019 Cath: nonobs dzs.   Blood transfusion without reported diagnosis    Breast cancer (HCC) 10/12/2020   Cataract    ESRD (end stage renal disease) on dialysis [facility])    a. HD T, T, S   Essential hypertension, benign    GERD (gastroesophageal reflux disease)    Headache    Hemorrhoids    Mixed hyperlipidemia    Morbid obesity (HCC)    Non-obstructive CAD (coronary artery disease)    a. 02/2019 CathL LM nl, LAD 21m, LCX nl, RCA 25p, EF 55-65%.   PONV (postoperative nausea and vomiting)  Renal insufficiency    S/P colonoscopy 08/2009   Dr. Rollin: sessile polyp (benign lymphoid), large hemorrhoids, repeat 5-10 years   Temporal arteritis (HCC)    Type 2 diabetes mellitus (HCC)    Wears glasses     Surgical History: Past Surgical History:  Procedure Laterality Date   ABDOMINAL HYSTERECTOMY     APPENDECTOMY     ARTERY BIOPSY N/A 05/09/2018   Procedure: RIGHT TEMPORAL ARTERY BIOPSY;  Surgeon: Kimble Agent, Megan;  Location: [facility] OR;  Service: General;  Laterality: N/A;   BIOPSY  10/04/2023   Procedure: BIOPSY;  Surgeon: Shaaron Lamar HERO, Megan;  Location[facility];  Service: Endoscopy;;   BREAST BIOPSY Right 06/15/2020   Procedure: RIGHT BREAST BIOPSY;  Surgeon: Mavis Anes, Megan;  Location: [facility];  Service: General;  Laterality: Right;   CATARACT EXTRACTION W/PHACO Left 02/09/2017   Procedure: CATARACT EXTRACTION PHACO AND INTRAOCULAR LENS PLACEMENT LEFT EYE;  Surgeon: Perley Hamilton, Megan;  Location: [facility];  Service: Ophthalmology;  Laterality: Left;  CDE: 4.89   CATARACT EXTRACTION W/PHACO Right 06/04/2017   Procedure: CATARACT EXTRACTION PHACO AND INTRAOCULAR LENS PLACEMENT (IOC);  Surgeon:  Perley Hamilton, Megan;  Location: [facility];  Service: Ophthalmology;  Laterality: Right;  CDE: 4.12   CHOLECYSTECTOMY  09/29/2011   Procedure: LAPAROSCOPIC CHOLECYSTECTOMY;  Surgeon: Anes DELENA Mavis, Megan;  Location: [facility];  Service: General;  Laterality: N/A;   COLONOSCOPY  08/2009   Dr. Rollin: sessile polyp (benign lymphoid), large hemorrhoids, repeat 5-10 years   COLONOSCOPY N/A 06/12/2016   prominent hemorrhoids   COLONOSCOPY WITH PROPOFOL  N/A 06/16/2021   Procedure: COLONOSCOPY WITH PROPOFOL ;  Surgeon: Shaaron Lamar HERO, Megan;  Location: [facility];  Service: Endoscopy;  Laterality: N/A;  9:30am (dialysis pt)   ESOPHAGOGASTRODUODENOSCOPY  09/05/2011   MFM:Dfjoo hiatal hernia; remainder of exam normal. No explanation for patient's abdominal pain with today's examination   ESOPHAGOGASTRODUODENOSCOPY N/A 12/17/2013   Dr. Shaaron: gastric erythema, erosion, mild chronic inflammation on path    ESOPHAGOGASTRODUODENOSCOPY N/A 12/31/2023   Procedure: EGD (ESOPHAGOGASTRODUODENOSCOPY);  Surgeon: Wilhelmenia Aloha Raddle., Megan;  Location: [facility];  Service: Gastroenterology;  Laterality: N/A;   ESOPHAGOGASTRODUODENOSCOPY (EGD) WITH PROPOFOL  N/A 06/16/2021   Procedure: ESOPHAGOGASTRODUODENOSCOPY (EGD) WITH PROPOFOL ;  Surgeon: Shaaron Lamar HERO, Megan;  [facility];  Service: Endoscopy;  Laterality: N/A;   ESOPHAGOGASTRODUODENOSCOPY (EGD) WITH PROPOFOL  N/A 10/04/2023   Procedure: ESOPHAGOGASTRODUODENOSCOPY (EGD) WITH PROPOFOL ;  Surgeon: Shaaron Lamar HERO, Megan;  [facility];  Service: Endoscopy;  Laterality: N/A;  200pm, ok rm 1-2, pt knows to arrive at 6:45   EUS N/A 12/31/2023   Procedure: ULTRASOUND, UPPER GI TRACT, ENDOSCOPIC;  Surgeon: Wilhelmenia Aloha Raddle., Megan;  Location: [facility];  Service: Gastroenterology;  Laterality: N/A;   EXCISION OF BREAST BIOPSY Right 10/12/2020   Procedure: EXCISION OF RIGHT BREAST BIOPSY;  Surgeon: Mavis Anes, Megan;  Location: [facility];  Service: General;   Laterality: Right;   IR DIALY SHUNT INTRO NEEDLE/INTRACATH INITIAL W/IMG LEFT Left 07/17/2023   LAPAROSCOPIC APPENDECTOMY  09/29/2011   Procedure: APPENDECTOMY LAPAROSCOPIC;  Surgeon: Anes DELENA Mavis, Megan;  Location: [facility];  Service: General;;  incidental appendectomy   LEFT HEART CATH AND CORONARY ANGIOGRAPHY N/A 02/28/2019   Procedure: LEFT HEART CATH AND CORONARY ANGIOGRAPHY;  Surgeon: Dann Candyce RAMAN, Megan;  Location: [facility] INVASIVE CV LAB;  Service: Cardiovascular;  Laterality: N/A;   MASS EXCISION Right 01/18/2023   Procedure: EXCISION MASS, RIGHT CHEST S/P MASTECTOMY;  Surgeon: Mavis Anes,  Megan;  Location: [facility];  Service: General;  Laterality: Right;   MASTECTOMY MODIFIED RADICAL Right 02/18/2020   Procedure: MASTECTOMY MODIFIED RADICAL;  Surgeon: Mavis Anes, Megan;  Location: [facility];  Service: General;  Laterality: Right;   MASTECTOMY, PARTIAL Right 07/13/2020   Procedure: RIGHT PARTIAL MASTECTOMY;  Surgeon: Mavis Anes, Megan;  Location: [facility];  Service: General;  Laterality: Right;   PARTIAL MASTECTOMY WITH NEEDLE LOCALIZATION AND AXILLARY SENTINEL LYMPH NODE BX Right 09/18/2018   Procedure: RIGHT PARTIAL MASTECTOMY AFTER NEEDLE LOCALIZATION, SENTINEL LYMPH NODE BIOPSY RIGHT AXILLA;  Surgeon: Mavis Anes, Megan;  Location: [facility];  Service: General;  Laterality: Right;   POLYPECTOMY  06/16/2021   Procedure: POLYPECTOMY;  Surgeon: Shaaron Lamar HERO, Megan;  [facility];  Service: Endoscopy;;   PORT-A-CATH REMOVAL Right 11/29/2023   Procedure: REMOVAL PORT-A-CATH;  Surgeon: Mavis Anes, Megan;  Location: [facility];  Service: General;  Laterality: Right;  MINOR PROCEDURE ROOM   PORTACATH PLACEMENT Right 06/07/2023   Procedure: INSERTION PORT-A-CATH, RIGHT  (DIALYSIS ACCESS ON LEFT);  Surgeon: Mavis Anes, Megan;  Location: [facility];  Service: General;  Laterality: Right;   SIMPLE MASTECTOMY WITH AXILLARY SENTINEL NODE BIOPSY Left 06/15/2020   Procedure: LEFT SIMPLE MASTECTOMY;  Surgeon:  Mavis Anes, Megan;  Location: [facility];  Service: General;  Laterality: Left;    Social History: Social History   Socioeconomic History   Marital status: Married    Spouse name: Not on file   Number of children: Not on file   Years of education: Not on file   Highest education level: 12th grade  Occupational History   Occupation: Systems developer: [employer]  Tobacco Use   Smoking status: Never    Passive exposure: Never   Smokeless tobacco: Never  Vaping Use   Vaping status: Never Used  Substance and Sexual Activity   Alcohol use: No   Drug use: No   Sexual activity: Yes    Birth control/protection: Surgical  Other Topics Concern   Not on file  Social History Narrative   Works at [employer] in [place].    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   Also has to get items from high shelves-causes achy pain in shoulder area      Married.   Children are grown, out of house.   Social Drivers of Corporate investment banker Strain: Low Risk  (09/20/2023)   Overall Financial Resource Strain (CARDIA)    Difficulty of Paying Living Expenses: Not hard at all  Food Insecurity: No Food Insecurity (09/20/2023)   Hunger Vital Sign    Worried About Running Out of Food in the Last Year: Never true    Ran Out of Food in the Last Year: Never true  Transportation Needs: No Transportation Needs (09/20/2023)   PRAPARE - Administrator, Civil Service (Medical): No    Lack of Transportation (Non-Medical): No  Physical Activity: Inactive (09/20/2023)   Exercise Vital Sign    Days of Exercise per Week: 0 days    Minutes of Exercise per Session: 0 min  Stress: No Stress Concern Present (09/20/2023)   [employer] of Occupational Health - Occupational Stress Questionnaire    Feeling of Stress : Not at all  Social Connections: Moderately Integrated (09/20/2023)   Social Connection and Isolation Panel    Frequency of Communication with Friends and Family: More than  three times a week  Frequency of Social Gatherings with Friends and Family: Three times a week    Attends Religious Services: More than 4 times per year    Active Member of Clubs or Organizations: No    Attends Banker Meetings: Never    Marital Status: Married  Catering manager Violence: Not At Risk (09/20/2023)   Humiliation, Afraid, Rape, and Kick questionnaire    Fear of Current or Ex-Partner: No    Emotionally Abused: No    Physically Abused: No    Sexually Abused: No    Family History: Family History  Problem Relation Age of Onset   Hypertension Mother    Coronary artery disease Mother    Diabetes Mother    Heart attack Father    Hypertension Sister    Coronary artery disease Sister    Hypertension Brother    Colon cancer Brother    Heart attack Maternal Grandmother    Heart attack Maternal Grandfather    Heart attack Paternal Grandmother    Heart attack Paternal Grandfather    Hypertension Son    Heart attack Maternal Aunt    Hypertension Maternal Aunt    Diabetes Maternal Aunt    Heart attack Maternal Uncle    Hypertension Maternal Uncle    Diabetes Maternal Uncle    Heart attack Paternal Aunt    Hypertension Paternal Aunt    Diabetes Paternal Aunt    Heart attack Paternal Uncle    Hypertension Paternal Uncle    Diabetes Paternal Uncle     Current Medications:  Current Outpatient Medications:    amLODipine  (NORVASC ) 5 MG tablet, Take 5 mg by mouth at bedtime., Disp: , Rfl:    aspirin  EC 81 MG tablet, Take 81 mg by mouth daily., Disp: , Rfl:    atorvastatin  (LIPITOR) 20 MG tablet, TAKE 1 TABLET(20 MG) BY MOUTH DAILY, Disp: 90 tablet, Rfl: 0   B Complex-C-Zn-Folic Acid  (DIALYVITE  800-ZINC  15) 0.8 MG TABS, Take 1 tablet by mouth daily., Disp: , Rfl:    cinacalcet  (SENSIPAR ) 60 MG tablet, Take 60 mg by mouth daily., Disp: , Rfl:    Darbepoetin Alfa  (ARANESP , ALBUMIN FREE, IJ), Darbepoetin Alfa  (Aranesp ), Disp: , Rfl:    fluticasone  (FLONASE ) 50  MCG/ACT nasal spray, Place 1 spray into both nostrils daily., Disp: , Rfl:    gabapentin  (NEURONTIN ) 300 MG capsule, TAKE 1 CAPSULE(300 MG) BY MOUTH AT BEDTIME, Disp: 90 capsule, Rfl: 3   HYDROcodone -acetaminophen  (NORCO/VICODIN) 5-325 MG tablet, Take 1 tablet by mouth every 6 (six) hours Megan needed., Disp: 12 tablet, Rfl: 0   iron  sucrose (VENOFER ) 20 MG/ML injection, Inject 50 mg into the vein once a week., Disp: , Rfl:    lanthanum  (FOSRENOL ) 1000 MG chewable tablet, Chew 2,000-3,000 mg by mouth See admin instructions. Take 3 tablets (3000 mg) by mouth with meals and take 2 tablets (2000 mg) with snacks, Disp: , Rfl:    lidocaine -prilocaine  (EMLA ) cream, APPLY TOPICALLY TO THE AFFECTED AREA 1 TIME, Disp: 30 g, Rfl: 3   meclizine  (ANTIVERT ) 25 MG tablet, Take 1 tablet (25 mg total) by mouth 3 (three) times daily Megan needed for dizziness., Disp: 30 tablet, Rfl: 1   nebivolol  (BYSTOLIC ) 10 MG tablet, TAKE 1 TABLET BY MOUTH IN THE MORNING AND AT BEDTIME, Disp: 180 tablet, Rfl: 0   nitroGLYCERIN  (NITROSTAT ) 0.4 MG SL tablet, Place 1 tablet (0.4 mg total) under the tongue every 5 (five) minutes x 3 doses Megan needed for chest pain (if no relief after  3rd dose, proceed to ED or call 911)., Disp: 25 tablet, Rfl: 3   ondansetron  (ZOFRAN ) 4 MG tablet, Take 1 tablet (4 mg total) by mouth every 6 (six) hours., Disp: 12 tablet, Rfl: 0   oxyCODONE  (ROXICODONE ) 5 MG immediate release tablet, Take 1 tablet (5 mg total) by mouth every 4 (four) hours Megan needed., Disp: 20 tablet, Rfl: 0   pantoprazole  (PROTONIX ) 40 MG tablet, Take 1 tablet (40 mg total) by mouth 2 (two) times daily before a meal. (Patient taking differently: Take 40 mg by mouth daily.), Disp: 60 tablet, Rfl: 11   Allergies: Allergies  Allergen Reactions   Motrin [Ibuprofen] Other (See Comments)    ESRD     REVIEW OF SYSTEMS:   Review of Systems  Constitutional:  Negative for chills, fatigue and fever.  HENT:   Negative for lump/mass, mouth  sores, nosebleeds, sore throat and trouble swallowing.   Eyes:  Negative for eye problems.  Respiratory:  Negative for cough and shortness of breath.   Cardiovascular:  Negative for chest pain, leg swelling and palpitations.  Gastrointestinal:  Positive for blood in stool. Negative for abdominal pain, constipation, diarrhea, nausea and vomiting.  Genitourinary:  Negative for bladder incontinence, difficulty urinating, dysuria, frequency, hematuria and nocturia.   Musculoskeletal:  Negative for arthralgias, back pain, flank pain, myalgias and neck pain.       +right breast pain 4/10 severity +right chest pain radiating down the right arm, 4/10 severity  Skin:  Negative for itching and rash.  Neurological:  Positive for dizziness and numbness (in fingers and feet). Negative for headaches.  Hematological:  Does not bruise/bleed easily.  Psychiatric/Behavioral:  Negative for depression, sleep disturbance and suicidal ideas. The patient is not nervous/anxious.   All other systems reviewed and are negative.    VITALS:   Blood pressure 135/65, pulse 83, temperature 98.3 F (36.8 C), temperature source Oral, resp. rate 16, weight 182 lb 12.2 oz (82.9 kg), SpO2 97%.  Wt Readings from Last 3 Encounters:  03/06/24 182 lb 12.2 oz (82.9 kg)  03/03/24 177 lb (80.3 kg)  02/12/24 182 lb 3.2 oz (82.6 kg)    Body mass index is 34.53 kg/m.  Performance status (ECOG): 1 - Symptomatic but completely ambulatory  PHYSICAL EXAM:   Physical Exam Vitals and nursing note reviewed. Exam conducted with a chaperone present.  Constitutional:      Appearance: Normal appearance.  Cardiovascular:     Rate and Rhythm: Normal rate and regular rhythm.     Pulses: Normal pulses.     Heart sounds: Normal heart sounds.  Pulmonary:     Effort: Pulmonary effort is normal.     Breath sounds: Normal breath sounds.  Abdominal:     Palpations: Abdomen is soft. There is no hepatomegaly, splenomegaly or mass.      Tenderness: There is no abdominal tenderness.  Musculoskeletal:     Right lower leg: No edema.     Left lower leg: No edema.  Lymphadenopathy:     Cervical: No cervical adenopathy.     Right cervical: No superficial, deep or posterior cervical adenopathy.    Left cervical: No superficial, deep or posterior cervical adenopathy.     Upper Body:     Right upper body: No supraclavicular or axillary adenopathy.     Left upper body: No supraclavicular or axillary adenopathy.  Neurological:     General: No focal deficit present.     Mental Status: She is alert and oriented  to person, place, and time.  Psychiatric:        Mood and Affect: Mood normal.        Behavior: Behavior normal.     LABS:      Latest Ref Rng & Units 03/03/2024   11:11 AM 02/28/2024    9:22 AM 01/24/2024    9:18 AM  CBC  WBC 4.0 - 10.5 K/uL 7.5  6.5  6.9   Hemoglobin 12.0 - 15.0 g/dL 88.6  89.3  89.7   Hematocrit 36.0 - 46.0 % 36.0  34.4  33.2   Platelets 150 - 400 K/uL 216  227  209       Latest Ref Rng & Units 03/03/2024   11:11 AM 02/28/2024    9:22 AM 01/24/2024    9:18 AM  CMP  Glucose 70 - 99 mg/dL 894  898  97   BUN 8 - 23 mg/dL 22  29  37   Creatinine 0.44 - 1.00 mg/dL 3.10  0.28  89.29   Sodium 135 - 145 mmol/L 136  139  140   Potassium 3.5 - 5.1 mmol/L 3.5  4.8  4.8   Chloride 98 - 111 mmol/L 90  94  99   CO2 22 - 32 mmol/L 32  28  26   Calcium  8.9 - 10.3 mg/dL 8.6  9.8  8.7   Total Protein 6.5 - 8.1 g/dL 8.0  7.7    Total Bilirubin 0.0 - 1.2 mg/dL 0.4  0.6    Alkaline Phos 38 - 126 U/L 120  87    AST 15 - 41 U/L 17  17    ALT 0 - 44 U/L 16  17       No results found for: CEA1, CEA / No results found for: CEA1, CEA No results found for: PSA1 No results found for: CAN199 No results found for: CAN125  No results found for: TOTALPROTELP, ALBUMINELP, A1GS, A2GS, BETS, BETA2SER, GAMS, MSPIKE, SPEI Lab Results  Component Value Date   TIBC 195 (L) 02/15/2021    TIBC 190 (L) 12/10/2018   TIBC 218 (L) 06/08/2016   FERRITIN 1,742 (H) 02/15/2021   FERRITIN 1,308 (H) 12/10/2018   FERRITIN 126 06/08/2016   IRONPCTSAT 15 02/15/2021   IRONPCTSAT 43 (H) 12/10/2018   IRONPCTSAT 22 06/08/2016   No results found for: LDH   STUDIES:   CT ABDOMEN PELVIS W CONTRAST Result Date: 03/03/2024 CLINICAL DATA:  Left lower quadrant pain with nausea. Breast cancer. * Tracking Code: BO * EXAM: CT ABDOMEN AND PELVIS WITH CONTRAST TECHNIQUE: Multidetector CT imaging of the abdomen and pelvis was performed using the standard protocol following bolus administration of intravenous contrast. RADIATION DOSE REDUCTION: This exam was performed according to the departmental dose-optimization program which includes automated exposure control, adjustment of the mA and/or kV according to patient size and/or use of iterative reconstruction technique. CONTRAST:  OMNIPAQUE  IOHEXOL  300 MG/ML  SOLN COMPARISON:  PET 01/31/2024, CT chest 01/24/2024 and CT chest abdomen pelvis 11/07/2023. 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 4 mm medial right lower lobe nodule (3/7) and 3 mm anterolateral right lower lobe nodule (3/5) appear new from 01/24/2024. No pleural fluid. Atherosclerotic calcification of the aorta with age advanced involvement of the coronary arteries. Heart is enlarged. No pericardial or pleural effusion. Distal esophagus is grossly unremarkable. Hepatobiliary: Liver is enlarged, 18.3 cm. Cholecystectomy. No biliary ductal dilatation. Pancreas: Negative. Spleen: Negative. Adrenals/Urinary Tract: Adrenal glands are unremarkable.  Kidneys are atrophic and contain multiple low and high attenuation lesions, difficult to further characterize due to size. Multiple bilateral renal stones. Ureters are decompressed. Bladder is very low in volume. Stomach/Bowel: Stomach, small bowel and colon are unremarkable. Appendix is not readily visualized.  Vascular/Lymphatic: Atherosclerotic calcification of the aorta. No pathologically enlarged lymph nodes. Reproductive: Hysterectomy.  No adnexal mass. Other: No free fluid.  Mesenteries and peritoneum are unremarkable. Musculoskeletal: No worrisome lytic or sclerotic lesions. IMPRESSION: 1. No findings to explain the patient's pain. 2. New right lower lobe nodules worrisome for metastatic disease. 3. Age advanced coronary artery calcification. 4. Mild hepatomegaly. 5. Multiple bilateral renal stones. 6. High and low-attenuation lesions in the kidneys are difficult to further characterize due to size and lack of precontrast imaging. Further evaluation with pre and post contrast MRI should be considered. Pre and post contrast CT could alternatively be performed, but would likely be of decreased accuracy given lesion size. Aortic atherosclerosis (ICD10-I70.0). Electronically Signed   By: Newell Eke M.D.   On: 03/03/2024 12:48   US  CORE BIOPSY (LYMPH NODES) Result Date: 02/26/2024 INDICATION: Left axillary PET positive adenopathy, history of breast cancer EXAM: ULTRASOUND CORE BIOPSY LEFT AXILLARY ADENOPATHY MEDICATIONS: 1% lidocaine  local ANESTHESIA/SEDATION: None. COMPLICATIONS: None immediate. PROCEDURE: Informed written consent was obtained from the patient after a thorough discussion of the procedural risks, benefits and alternatives. All questions were addressed. Maximal Sterile Barrier Technique was utilized including caps, mask, sterile gowns, sterile gloves, sterile drape, hand hygiene and skin antiseptic. A timeout was performed prior to the initiation of the procedure. Previous imaging reviewed. Preliminary ultrasound performed. Abnormal left axillary adenopathy was localized and marked. Under sterile conditions and local anesthesia, 18 gauge core biopsy needle was advanced to the adenopathy. 18 gauge core biopsies obtained under direct ultrasound. Images obtained for documentation. Samples placed in  saline. These were intact and non fragmented. Postprocedure imaging demonstrates no hemorrhage or hematoma. Patient tolerated the biopsy well. IMPRESSION: Successful ultrasound core biopsy left axillary adenopathy. Electronically Signed   By: CHRISTELLA.  Shick M.D.   On: 02/26/2024 14:16

## 2024-03-07 DIAGNOSIS — N2581 Secondary hyperparathyroidism of renal origin: Secondary | ICD-10-CM | POA: Diagnosis not present

## 2024-03-07 DIAGNOSIS — D631 Anemia in chronic kidney disease: Secondary | ICD-10-CM | POA: Diagnosis not present

## 2024-03-07 DIAGNOSIS — E1129 Type 2 diabetes mellitus with other diabetic kidney complication: Secondary | ICD-10-CM | POA: Diagnosis not present

## 2024-03-07 DIAGNOSIS — Z992 Dependence on renal dialysis: Secondary | ICD-10-CM | POA: Diagnosis not present

## 2024-03-07 DIAGNOSIS — N186 End stage renal disease: Secondary | ICD-10-CM | POA: Diagnosis not present

## 2024-03-07 DIAGNOSIS — D509 Iron deficiency anemia, unspecified: Secondary | ICD-10-CM | POA: Diagnosis not present

## 2024-03-10 DIAGNOSIS — E1129 Type 2 diabetes mellitus with other diabetic kidney complication: Secondary | ICD-10-CM | POA: Diagnosis not present

## 2024-03-10 DIAGNOSIS — Z992 Dependence on renal dialysis: Secondary | ICD-10-CM | POA: Diagnosis not present

## 2024-03-10 DIAGNOSIS — N186 End stage renal disease: Secondary | ICD-10-CM | POA: Diagnosis not present

## 2024-03-10 DIAGNOSIS — N2581 Secondary hyperparathyroidism of renal origin: Secondary | ICD-10-CM | POA: Diagnosis not present

## 2024-03-10 DIAGNOSIS — D631 Anemia in chronic kidney disease: Secondary | ICD-10-CM | POA: Diagnosis not present

## 2024-03-10 DIAGNOSIS — D509 Iron deficiency anemia, unspecified: Secondary | ICD-10-CM | POA: Diagnosis not present

## 2024-03-12 DIAGNOSIS — N2581 Secondary hyperparathyroidism of renal origin: Secondary | ICD-10-CM | POA: Diagnosis not present

## 2024-03-12 DIAGNOSIS — E1129 Type 2 diabetes mellitus with other diabetic kidney complication: Secondary | ICD-10-CM | POA: Diagnosis not present

## 2024-03-12 DIAGNOSIS — D509 Iron deficiency anemia, unspecified: Secondary | ICD-10-CM | POA: Diagnosis not present

## 2024-03-12 DIAGNOSIS — D631 Anemia in chronic kidney disease: Secondary | ICD-10-CM | POA: Diagnosis not present

## 2024-03-12 DIAGNOSIS — Z992 Dependence on renal dialysis: Secondary | ICD-10-CM | POA: Diagnosis not present

## 2024-03-12 DIAGNOSIS — N186 End stage renal disease: Secondary | ICD-10-CM | POA: Diagnosis not present

## 2024-03-13 ENCOUNTER — Other Ambulatory Visit (HOSPITAL_COMMUNITY)

## 2024-03-14 DIAGNOSIS — N186 End stage renal disease: Secondary | ICD-10-CM | POA: Diagnosis not present

## 2024-03-14 DIAGNOSIS — D631 Anemia in chronic kidney disease: Secondary | ICD-10-CM | POA: Diagnosis not present

## 2024-03-14 DIAGNOSIS — E119 Type 2 diabetes mellitus without complications: Secondary | ICD-10-CM | POA: Diagnosis not present

## 2024-03-14 DIAGNOSIS — D509 Iron deficiency anemia, unspecified: Secondary | ICD-10-CM | POA: Diagnosis not present

## 2024-03-14 DIAGNOSIS — N2581 Secondary hyperparathyroidism of renal origin: Secondary | ICD-10-CM | POA: Diagnosis not present

## 2024-03-14 DIAGNOSIS — N040 Nephrotic syndrome with minor glomerular abnormality: Secondary | ICD-10-CM | POA: Diagnosis not present

## 2024-03-14 DIAGNOSIS — Z992 Dependence on renal dialysis: Secondary | ICD-10-CM | POA: Diagnosis not present

## 2024-03-14 DIAGNOSIS — E1129 Type 2 diabetes mellitus with other diabetic kidney complication: Secondary | ICD-10-CM | POA: Diagnosis not present

## 2024-03-17 DIAGNOSIS — D631 Anemia in chronic kidney disease: Secondary | ICD-10-CM | POA: Diagnosis not present

## 2024-03-17 DIAGNOSIS — D509 Iron deficiency anemia, unspecified: Secondary | ICD-10-CM | POA: Diagnosis not present

## 2024-03-17 DIAGNOSIS — N2581 Secondary hyperparathyroidism of renal origin: Secondary | ICD-10-CM | POA: Diagnosis not present

## 2024-03-17 DIAGNOSIS — Z992 Dependence on renal dialysis: Secondary | ICD-10-CM | POA: Diagnosis not present

## 2024-03-17 DIAGNOSIS — E1129 Type 2 diabetes mellitus with other diabetic kidney complication: Secondary | ICD-10-CM | POA: Diagnosis not present

## 2024-03-17 DIAGNOSIS — N186 End stage renal disease: Secondary | ICD-10-CM | POA: Diagnosis not present

## 2024-03-19 ENCOUNTER — Inpatient Hospital Stay: Attending: Hematology | Admitting: Hematology

## 2024-03-19 VITALS — BP 138/77 | HR 86 | Temp 98.1°F | Resp 18 | Wt 182.8 lb

## 2024-03-19 DIAGNOSIS — R160 Hepatomegaly, not elsewhere classified: Secondary | ICD-10-CM | POA: Insufficient documentation

## 2024-03-19 DIAGNOSIS — I700 Atherosclerosis of aorta: Secondary | ICD-10-CM | POA: Diagnosis not present

## 2024-03-19 DIAGNOSIS — I132 Hypertensive heart and chronic kidney disease with heart failure and with stage 5 chronic kidney disease, or end stage renal disease: Secondary | ICD-10-CM | POA: Insufficient documentation

## 2024-03-19 DIAGNOSIS — R918 Other nonspecific abnormal finding of lung field: Secondary | ICD-10-CM | POA: Insufficient documentation

## 2024-03-19 DIAGNOSIS — Z171 Estrogen receptor negative status [ER-]: Secondary | ICD-10-CM

## 2024-03-19 DIAGNOSIS — G629 Polyneuropathy, unspecified: Secondary | ICD-10-CM | POA: Diagnosis not present

## 2024-03-19 DIAGNOSIS — Z79811 Long term (current) use of aromatase inhibitors: Secondary | ICD-10-CM | POA: Diagnosis not present

## 2024-03-19 DIAGNOSIS — Z800 Family history of malignant neoplasm of digestive organs: Secondary | ICD-10-CM | POA: Insufficient documentation

## 2024-03-19 DIAGNOSIS — Z7982 Long term (current) use of aspirin: Secondary | ICD-10-CM | POA: Insufficient documentation

## 2024-03-19 DIAGNOSIS — R0789 Other chest pain: Secondary | ICD-10-CM | POA: Insufficient documentation

## 2024-03-19 DIAGNOSIS — Z992 Dependence on renal dialysis: Secondary | ICD-10-CM | POA: Insufficient documentation

## 2024-03-19 DIAGNOSIS — Z1732 Human epidermal growth factor receptor 2 negative status: Secondary | ICD-10-CM | POA: Diagnosis not present

## 2024-03-19 DIAGNOSIS — Z79899 Other long term (current) drug therapy: Secondary | ICD-10-CM | POA: Diagnosis not present

## 2024-03-19 DIAGNOSIS — N200 Calculus of kidney: Secondary | ICD-10-CM | POA: Insufficient documentation

## 2024-03-19 DIAGNOSIS — Z9011 Acquired absence of right breast and nipple: Secondary | ICD-10-CM | POA: Diagnosis not present

## 2024-03-19 DIAGNOSIS — N2581 Secondary hyperparathyroidism of renal origin: Secondary | ICD-10-CM | POA: Diagnosis not present

## 2024-03-19 DIAGNOSIS — E1129 Type 2 diabetes mellitus with other diabetic kidney complication: Secondary | ICD-10-CM | POA: Diagnosis not present

## 2024-03-19 DIAGNOSIS — Z809 Family history of malignant neoplasm, unspecified: Secondary | ICD-10-CM | POA: Diagnosis not present

## 2024-03-19 DIAGNOSIS — D509 Iron deficiency anemia, unspecified: Secondary | ICD-10-CM | POA: Diagnosis not present

## 2024-03-19 DIAGNOSIS — I2510 Atherosclerotic heart disease of native coronary artery without angina pectoris: Secondary | ICD-10-CM | POA: Insufficient documentation

## 2024-03-19 DIAGNOSIS — M8580 Other specified disorders of bone density and structure, unspecified site: Secondary | ICD-10-CM | POA: Insufficient documentation

## 2024-03-19 DIAGNOSIS — C50211 Malignant neoplasm of upper-inner quadrant of right female breast: Secondary | ICD-10-CM | POA: Insufficient documentation

## 2024-03-19 DIAGNOSIS — Z170 Estrogen receptor positive status [ER+]: Secondary | ICD-10-CM | POA: Diagnosis not present

## 2024-03-19 DIAGNOSIS — M79629 Pain in unspecified upper arm: Secondary | ICD-10-CM | POA: Insufficient documentation

## 2024-03-19 DIAGNOSIS — Z1501 Genetic susceptibility to malignant neoplasm of breast: Secondary | ICD-10-CM | POA: Diagnosis not present

## 2024-03-19 DIAGNOSIS — Z1722 Progesterone receptor negative status: Secondary | ICD-10-CM | POA: Insufficient documentation

## 2024-03-19 DIAGNOSIS — D631 Anemia in chronic kidney disease: Secondary | ICD-10-CM | POA: Diagnosis not present

## 2024-03-19 DIAGNOSIS — N186 End stage renal disease: Secondary | ICD-10-CM | POA: Insufficient documentation

## 2024-03-19 DIAGNOSIS — E1122 Type 2 diabetes mellitus with diabetic chronic kidney disease: Secondary | ICD-10-CM | POA: Insufficient documentation

## 2024-03-19 MED ORDER — OXYCODONE HCL 5 MG PO TABS: 5.0000 mg | ORAL_TABLET | Freq: Two times a day (BID) | ORAL | 0 refills | Status: DC | PRN

## 2024-03-19 NOTE — Progress Notes (Signed)
 [facility] [address] [place] [address]    Clinic Day:  03/19/24   Referring physician: Duanne Butler DASEN, MD  Patient Care Team: Duanne Butler DASEN, MD as PCP - General (Family Medicine) Alvan Dorn FALCON, MD as PCP - Cardiology (Cardiology) Shaaron Lamar HERO, MD (Gastroenterology) Debera Jayson MATSU, MD as Consulting Physician (Cardiology) Rogers Hai, MD as Medical Oncologist (Medical Oncology) Rayburn Pac, MD as Consulting Physician (Nephrology)   ASSESSMENT & PLAN:   Assessment: 1.  Stage Ib (T1CN0) right breast cancer, ER positive, PR and HER-2 negative: -Right lumpectomy and sentinel lymph node biopsy on 09/18/2018 shows 1.5 cm IDC, grade 3, associated with high-grade DCIS, negative margins, 0/3 lymph nodes positive, ER 50%, PR negative and HER-2 negative, Ki-67 60%. -Oncotype DX recurrence score 47.  Distant recurrence at 9 years with tamoxifen alone is 36%.  Absolute chemotherapy benefit is more than 15%. -Adjuvant chemotherapy with 4 cycles of AC from 10/30/2018 through 01/01/2019. -She was started on anastrozole  in June 2020. -Mammogram on 09/02/2019 showed calcifications in the posterior aspect of upper outer quadrant of the right breast. -Right partial mastectomy on 02/18/2020 shows high-grade DCIS, 1.5 cm, no invasive carcinoma, resection margins negative.  0/11 lymph nodes.  ER 30% positive, PR negative. -Left simple mastectomy on 06/15/2020 was benign.  Right breast biopsy shows microscopic focus of invasive ductal carcinoma in the background of extensive high-grade DCIS. -Right breast lumpectomy on 07/13/2020 shows multifocal invasive ductal carcinoma, grade 3, largest measuring 1.2 cm.  Extensive high-grade DCIS with necrosis.  Margins are free.  PT1CNX.  There are multiple foci of invasive carcinoma arising from extensive DCIS.  Several small foci of invasive tumor arising from DCIS indicating new primaries.  ER/PR/HER-2 is negative.   Ki-67 is 20%. -PET scan on 09/14/2020 shows areas of nodularity with discrete nodule in the fat of the inferior chest wall within the subcutaneous tissues. -Ultrasound of the right chest wall shows masslike abnormality in the upper inner aspect of the right anterior chest at 1 o'clock position measuring 4.1 cm in greatest dimension.  1.7 cm hypoechoic mass in the central anterior right chest concerning for malignancy.  Possible borderline enlarged residual right axillary lymph node. -Right breast biopsy on 10/12/2020 shows 1.3 cm invasive ductal carcinoma, focally 0.1 cm from superior margin.  DCIS is less than 0.1 cm from superior margin.  PT1CPNX. - Right mastectomy followed by radiation therapy completed in May 2022. - PET scan (11/30/2022): Recurrent/metastatic disease within the right medial pectoralis musculature. - Biopsy (12/19/2022): Metastatic breast adenocarcinoma.  ER positive, 40%, weak staining.  PR negative.  Ki-67 30%.  HER2 IHC 0. - Right chest wall mass resection on 01/18/2023.  Pathology shows 1.7 cm IDC, grade 3.  DCIS with high-grade with necrosis.  Carcinoma involves inked inferior margin with focally invading into atrophic skeletal muscle. - Her case was discussed at multidisciplinary clinic at [facility] on 04/18/2023. - Surgical oncology felt that resection margins are adequate and recommended radiation therapy.  She was also evaluated by medical oncology (Dr. [name]) who has recommended systemic therapy with 6 cycles of docetaxel , carboplatin  and pembrolizumab . - XRT from 05/07/2023 through 05/25/2023. - 6 cycles of carboplatin , docetaxel  and pembrolizumab  from 06/14/2023 through 09/27/2023 - PET scan (01/31/2024): Multiple hypermetabolic lymph nodes in the left axillary region, chest wall, left supraclavicular region concerning for metastatic disease. - Left axillary lymph node biopsy (02/25/2021): Metastatic carcinoma, ER/PR negative, HER2 negative (0), Ki-67 20%. - NGS: -  Germline  mutation testing:  2.  Osteopenia: -Bone density on 01/20/2019 shows T score -1.9. - DEXA scan (03/24/2021) T score -2.4  3.  ESRD on HD: - She is on HD on Monday/Wednesday/Friday under the direction of Dr. Rayburn.  4. Family History: -Brother had colon cancer. Paternal aunt had cancer, type unknown.     Plan: 1.  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 - She had developed lymphadenopathy 4 months after finishing last cycle of chemoimmunotherapy. - I have reached out to Dr. Olam Maffucci at [facility]  She has kindly agreed to see this patient and discuss further treatment options.  We discussed that these options could include chemotherapy followed by surgery/radiation. - She will proceed with port placement with Dr. Mavis.   2.  Peripheral neuropathy: - She has tingling in the fingertips, right more than left.  Occasional pain in the feet present.  Continue gabapentin  300 mg at bedtime.   3.  Right chest wall/upper arm pain: - Continue oxycodone  5 mg 1-2 times daily as needed.     No orders of the defined types were placed in this encounter.      Megan [name],acting as a Neurosurgeon for [name], MD.,have documented all relevant documentation on the behalf of [name], MD,as directed by  [name], MD while in the presence of [name], MD.  I, [name] MD, have reviewed the above documentation for accuracy and completeness, and I agree with the above.       [name], MD   8/6/20254:46 PM  CHIEF COMPLAINT:   Diagnosis: recurrent right breast cancer    Cancer Staging  Malignant neoplasm of upper-inner quadrant of right female breast [facility]) Staging form: Breast, AJCC 8th Edition - Clinical stage from 09/26/2018: Stage IB  (cT1c, cN0, cM0, G3, ER+, PR-, HER2-) - Signed by Stands Alean, MD on 09/26/2018    Prior Therapy: 1. Right lumpectomy and sentinel lymph node biopsy on 09/18/2018  2. Adjuvant chemotherapy with 4 cycles of AC from 10/30/2018 through 01/01/2019  3. Right partial mastectomy on 02/18/2020  4. Right breast lumpectomy on 07/13/2020  5. Right mastectomy followed by radiation therapy completed in May 2022 6.  Anastrozole  started in June 2020 7.  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 female presenting to clinic today for follow up of recurrent right breast cancer. She was last seen by me on 03/06/2024.  Today, she states that she is doing  well overall. Her appetite level is at 70%. Her energy level is at 20%. Megan [name] is accompanied by her husband.   PAST MEDICAL HISTORY:   Past Medical History: Past Medical History:  Diagnosis Date   (HFpEF) heart failure with preserved ejection fraction (HCC)    a. 01/2019 Echo: EF 55-60%, mild conc LVH. DD.  Torn MV chordae.   Anemia    Atypical chest pain    a. 08/2018 MV: EF 59%, no ischemia; b.  02/2019 Cath: nonobs dzs.   Blood transfusion without reported diagnosis    Breast cancer (HCC) 10/12/2020   Cataract    ESRD (end stage renal disease) on dialysis [facility])    a. HD T, T, S   Essential hypertension, benign    GERD (gastroesophageal reflux disease)    Headache    Hemorrhoids    Mixed hyperlipidemia    Morbid obesity (HCC)    Non-obstructive CAD (coronary artery disease)    a. 02/2019 CathL LM nl, LAD 25m, LCX nl, RCA 25p, EF 55-65%.   PONV (postoperative nausea and vomiting)    Renal insufficiency    S/P colonoscopy 08/2009   Dr. Rollin: sessile polyp (benign lymphoid), large hemorrhoids, repeat 5-10 years   Temporal arteritis (HCC)    Type 2 diabetes mellitus (HCC)    Wears glasses     Surgical History: Past Surgical History:  Procedure Laterality Date   ABDOMINAL HYSTERECTOMY     APPENDECTOMY     ARTERY BIOPSY N/A 05/09/2018   Procedure: RIGHT TEMPORAL ARTERY BIOPSY;  Surgeon: Kimble Agent, MD;  Location: [facility] OR;  Service: General;  Laterality: N/A;   BIOPSY  10/04/2023   Procedure: BIOPSY;  Surgeon: Shaaron Lamar HERO, MD;  Location: [facility];  Service: Endoscopy;;   BREAST BIOPSY Right 06/15/2020   Procedure: RIGHT BREAST BIOPSY;  Surgeon: Mavis Anes, MD;  Location: [facility] ORS;  Service: General;  Laterality: Right;   CATARACT EXTRACTION W/PHACO Left 02/09/2017   Procedure: CATARACT EXTRACTION PHACO AND INTRAOCULAR LENS PLACEMENT LEFT EYE;  Surgeon: Perley Hamilton, MD;  Location: [facility];  Service: Ophthalmology;  Laterality: Left;  CDE: 4.89   CATARACT EXTRACTION W/PHACO Right 06/04/2017   Procedure: CATARACT EXTRACTION PHACO AND INTRAOCULAR LENS PLACEMENT (IOC);  Surgeon: Perley Hamilton, MD;  Location: [facility];  Service: Ophthalmology;  Laterality: Right;  CDE: 4.12   CHOLECYSTECTOMY  09/29/2011   Procedure: LAPAROSCOPIC CHOLECYSTECTOMY;  Surgeon: Anes DELENA Mavis, MD;  Location: [facility];  Service: General;  Laterality: N/A;   COLONOSCOPY  08/2009   Dr. Rollin: sessile polyp  (benign lymphoid), large hemorrhoids, repeat 5-10 years   COLONOSCOPY N/A 06/12/2016   prominent hemorrhoids   COLONOSCOPY WITH PROPOFOL  N/A 06/16/2021   Procedure: COLONOSCOPY WITH PROPOFOL ;  Surgeon: Shaaron Lamar HERO, MD;  [facility];  Service: Endoscopy;  Laterality: N/A;  9:30am (dialysis pt)   ESOPHAGOGASTRODUODENOSCOPY  09/05/2011   MFM:Dfjoo hiatal hernia; remainder of exam normal. No explanation for patient's abdominal pain with today's examination   ESOPHAGOGASTRODUODENOSCOPY N/A 12/17/2013   Dr. Shaaron: gastric erythema, erosion, mild chronic inflammation on path    ESOPHAGOGASTRODUODENOSCOPY N/A 12/31/2023   Procedure: EGD (ESOPHAGOGASTRODUODENOSCOPY);  Surgeon: Wilhelmenia Aloha Raddle., MD;  Location: [facility];  Service: Gastroenterology;  Laterality: N/A;   ESOPHAGOGASTRODUODENOSCOPY (EGD) WITH PROPOFOL  N/A 06/16/2021   Procedure: ESOPHAGOGASTRODUODENOSCOPY (EGD) WITH PROPOFOL ;  Surgeon: Shaaron Lamar HERO, MD;  [facility];  Service: Endoscopy;  Laterality: N/A;   ESOPHAGOGASTRODUODENOSCOPY (EGD) WITH PROPOFOL  N/A 10/04/2023   Procedure:  ESOPHAGOGASTRODUODENOSCOPY (EGD) WITH PROPOFOL ;  Surgeon: Shaaron Lamar HERO, MD;  [facility];  Service: Endoscopy;  Laterality: N/A;  200pm, ok rm 1-2, pt knows to arrive at 6:45   EUS N/A 12/31/2023   Procedure: ULTRASOUND, UPPER GI TRACT, ENDOSCOPIC;  Surgeon: Wilhelmenia Aloha Raddle., MD;  Location: [facility];  Service: Gastroenterology;  Laterality: N/A;   EXCISION OF BREAST BIOPSY Right 10/12/2020   Procedure: EXCISION OF RIGHT BREAST BIOPSY;  Surgeon: Mavis Anes, MD;  Location: [facility];  Service: General;  Laterality: Right;   IR DIALY SHUNT INTRO NEEDLE/INTRACATH INITIAL W/IMG LEFT Left 07/17/2023   LAPAROSCOPIC APPENDECTOMY  09/29/2011   Procedure: APPENDECTOMY LAPAROSCOPIC;  Surgeon: Anes DELENA Mavis, MD;  Location: [facility];  Service: General;;  incidental appendectomy   LEFT HEART CATH AND CORONARY  ANGIOGRAPHY N/A 02/28/2019   Procedure: LEFT HEART CATH AND CORONARY ANGIOGRAPHY;  Surgeon: Dann Candyce RAMAN, MD;  Location: [facility] INVASIVE CV LAB;  Service: Cardiovascular;  Laterality: N/A;   MASS EXCISION Right 01/18/2023   Procedure: EXCISION MASS, RIGHT CHEST S/P MASTECTOMY;  Surgeon: Mavis Anes, MD;  Location: [facility];  Service: General;  Laterality: Right;   MASTECTOMY MODIFIED RADICAL Right 02/18/2020   Procedure: MASTECTOMY MODIFIED RADICAL;  Surgeon: Mavis Anes, MD;  Location: [facility];  Service: General;  Laterality: Right;   MASTECTOMY, PARTIAL Right 07/13/2020   Procedure: RIGHT PARTIAL MASTECTOMY;  Surgeon: Mavis Anes, MD;  Location: [facility];  Service: General;  Laterality: Right;   PARTIAL MASTECTOMY WITH NEEDLE LOCALIZATION AND AXILLARY SENTINEL LYMPH NODE BX Right 09/18/2018   Procedure: RIGHT PARTIAL MASTECTOMY AFTER NEEDLE LOCALIZATION, SENTINEL LYMPH NODE BIOPSY RIGHT AXILLA;  Surgeon: Mavis Anes, MD;  Location: [facility];  Service: General;  Laterality: Right;   POLYPECTOMY  06/16/2021   Procedure: POLYPECTOMY;  Surgeon: Shaaron Lamar HERO, MD;  [facility];  Service: Endoscopy;;   PORT-A-CATH REMOVAL Right 11/29/2023   Procedure: REMOVAL PORT-A-CATH;  Surgeon: Mavis Anes, MD;  Location: [facility];  Service: General;  Laterality: Right;  MINOR PROCEDURE ROOM   PORTACATH PLACEMENT Right 06/07/2023   Procedure: INSERTION PORT-A-CATH, RIGHT  (DIALYSIS ACCESS ON LEFT);  Surgeon: Mavis Anes, MD;  Location: [facility];  Service: General;  Laterality: Right;   SIMPLE MASTECTOMY WITH AXILLARY SENTINEL NODE BIOPSY Left 06/15/2020   Procedure: LEFT SIMPLE MASTECTOMY;  Surgeon: Mavis Anes, MD;  Location: [facility];  Service: General;  Laterality: Left;    Social History: Social History   Socioeconomic History   Marital status: Married    Spouse name: Not on file   Number of children: Not on file   Years of education: Not on file   Highest education level: 12th grade   Occupational History   Occupation: Systems developer: [employer]  Tobacco Use   Smoking status: Never    Passive exposure: Never   Smokeless tobacco: Never  Vaping Use   Vaping status: Never Used  Substance and Sexual Activity   Alcohol use: No   Drug use: No   Sexual activity: Yes    Birth control/protection: Surgical  Other Topics Concern   Not on file  Social History Narrative   Works at [employer] in [place].    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   Also has to get items from high shelves-causes achy pain in shoulder area      Married.   Children are grown, out of house.  Social Drivers of Corporate investment banker Strain: Low Risk  (09/20/2023)   Overall Financial Resource Strain (CARDIA)    Difficulty of Paying Living Expenses: Not hard at all  Food Insecurity: No Food Insecurity (09/20/2023)   Hunger Vital Sign    Worried About Running Out of Food in the Last Year: Never true    Ran Out of Food in the Last Year: Never true  Transportation Needs: No Transportation Needs (09/20/2023)   PRAPARE - Administrator, Civil Service (Medical): No    Lack of Transportation (Non-Medical): No  Physical Activity: Inactive (09/20/2023)   Exercise Vital Sign    Days of Exercise per Week: 0 days    Minutes of Exercise per Session: 0 min  Stress: No Stress Concern Present (09/20/2023)   [employer] of Occupational Health - Occupational Stress Questionnaire    Feeling of Stress : Not at all  Social Connections: Moderately Integrated (09/20/2023)   Social Connection and Isolation Panel    Frequency of Communication with Friends and Family: More than three times a week    Frequency of Social Gatherings with Friends and Family: Three times a week    Attends Religious Services: More than 4 times per year    Active Member of Clubs or Organizations: No    Attends Banker Meetings: Never    Marital Status: Married  Catering manager  Violence: Not At Risk (09/20/2023)   Humiliation, Afraid, Rape, and Kick questionnaire    Fear of Current or Ex-Partner: No    Emotionally Abused: No    Physically Abused: No    Sexually Abused: No    Family History: Family History  Problem Relation Age of Onset   Hypertension Mother    Coronary artery disease Mother    Diabetes Mother    Heart attack Father    Hypertension Sister    Coronary artery disease Sister    Hypertension Brother    Colon cancer Brother    Heart attack Maternal Grandmother    Heart attack Maternal Grandfather    Heart attack Paternal Grandmother    Heart attack Paternal Grandfather    Hypertension Son    Heart attack Maternal Aunt    Hypertension Maternal Aunt    Diabetes Maternal Aunt    Heart attack Maternal Uncle    Hypertension Maternal Uncle    Diabetes Maternal Uncle    Heart attack Paternal Aunt    Hypertension Paternal Aunt    Diabetes Paternal Aunt    Heart attack Paternal Uncle    Hypertension Paternal Uncle    Diabetes Paternal Uncle     Current Medications:  Current Outpatient Medications:    amLODipine  (NORVASC ) 5 MG tablet, Take 5 mg by mouth at bedtime., Disp: , Rfl:    aspirin  EC 81 MG tablet, Take 81 mg by mouth daily., Disp: , Rfl:    atorvastatin  (LIPITOR) 20 MG tablet, TAKE 1 TABLET(20 MG) BY MOUTH DAILY, Disp: 90 tablet, Rfl: 0   B Complex-C-Zn-Folic Acid  (DIALYVITE  800-ZINC  15) 0.8 MG TABS, Take 1 tablet by mouth daily., Disp: , Rfl:    cinacalcet  (SENSIPAR ) 60 MG tablet, Take 60 mg by mouth daily., Disp: , Rfl:    cinacalcet  (SENSIPAR ) 90 MG tablet, SMARTSIG:1 Tablet(s) By Mouth Every Evening, Disp: , Rfl:    Darbepoetin Alfa  (ARANESP , ALBUMIN FREE, IJ), Darbepoetin Alfa  (Aranesp ), Disp: , Rfl:    fluticasone  (FLONASE ) 50 MCG/ACT nasal spray, Place 1 spray into both nostrils daily.,  Disp: , Rfl:    gabapentin  (NEURONTIN ) 300 MG capsule, TAKE 1 CAPSULE(300 MG) BY MOUTH AT BEDTIME, Disp: 90 capsule, Rfl: 3    HYDROcodone -acetaminophen  (NORCO/VICODIN) 5-325 MG tablet, Take 1 tablet by mouth every 6 (six) hours as needed., Disp: 12 tablet, Rfl: 0   iron  sucrose (VENOFER ) 20 MG/ML injection, Inject 50 mg into the vein once a week., Disp: , Rfl:    lanthanum  (FOSRENOL ) 1000 MG chewable tablet, Chew 2,000-3,000 mg by mouth See admin instructions. Take 3 tablets (3000 mg) by mouth with meals and take 2 tablets (2000 mg) with snacks, Disp: , Rfl:    lidocaine -prilocaine  (EMLA ) cream, APPLY TOPICALLY TO THE AFFECTED AREA 1 TIME, Disp: 30 g, Rfl: 3   meclizine  (ANTIVERT ) 25 MG tablet, Take 1 tablet (25 mg total) by mouth 3 (three) times daily as needed for dizziness., Disp: 30 tablet, Rfl: 1   nebivolol  (BYSTOLIC ) 10 MG tablet, TAKE 1 TABLET BY MOUTH IN THE MORNING AND AT BEDTIME, Disp: 180 tablet, Rfl: 0   nitroGLYCERIN  (NITROSTAT ) 0.4 MG SL tablet, Place 1 tablet (0.4 mg total) under the tongue every 5 (five) minutes x 3 doses as needed for chest pain (if no relief after 3rd dose, proceed to ED or call 911)., Disp: 25 tablet, Rfl: 3   ondansetron  (ZOFRAN ) 4 MG tablet, Take 1 tablet (4 mg total) by mouth every 6 (six) hours., Disp: 12 tablet, Rfl: 0   pantoprazole  (PROTONIX ) 40 MG tablet, Take 1 tablet (40 mg total) by mouth 2 (two) times daily before a meal. (Patient taking differently: Take 40 mg by mouth daily.), Disp: 60 tablet, Rfl: 11   oxyCODONE  (ROXICODONE ) 5 MG immediate release tablet, Take 1 tablet (5 mg total) by mouth every 12 (twelve) hours as needed., Disp: 60 tablet, Rfl: 0   Allergies: Allergies  Allergen Reactions   Motrin [Ibuprofen] Other (See Comments)    ESRD     REVIEW OF SYSTEMS:   Review of Systems  Constitutional:  Negative for chills, fatigue and fever.  HENT:   Negative for lump/mass, mouth sores, nosebleeds, sore throat and trouble swallowing.   Eyes:  Negative for eye problems.  Respiratory:  Negative for cough and shortness of breath.   Cardiovascular:  Positive for chest  pain. Negative for leg swelling and palpitations.  Gastrointestinal:  Negative for abdominal pain, constipation, diarrhea, nausea and vomiting.  Genitourinary:  Negative for bladder incontinence, difficulty urinating, dysuria, frequency, hematuria and nocturia.   Musculoskeletal:  Negative for arthralgias, back pain, flank pain, myalgias and neck pain.  Skin:  Negative for itching and rash.  Neurological:  Positive for headaches and numbness. Negative for dizziness.  Hematological:  Does not bruise/bleed easily.  Psychiatric/Behavioral:  Negative for depression, sleep disturbance and suicidal ideas. The patient is not nervous/anxious.   All other systems reviewed and are negative.    VITALS:   Blood pressure 138/77, pulse 86, temperature 98.1 F (36.7 C), temperature source Oral, resp. rate 18, weight 182 lb 12.2 oz (82.9 kg), SpO2 99%.  Wt Readings from Last 3 Encounters:  03/19/24 182 lb 12.2 oz (82.9 kg)  03/06/24 182 lb 12.2 oz (82.9 kg)  03/03/24 177 lb (80.3 kg)    Body mass index is 34.53 kg/m.  Performance status (ECOG): 1 - Symptomatic but completely ambulatory  PHYSICAL EXAM:   Physical Exam Vitals and nursing note reviewed. Exam conducted with a chaperone present.  Constitutional:      Appearance: Normal appearance.  Cardiovascular:  Rate and Rhythm: Normal rate and regular rhythm.     Pulses: Normal pulses.     Heart sounds: Normal heart sounds.  Pulmonary:     Effort: Pulmonary effort is normal.     Breath sounds: Normal breath sounds.  Abdominal:     Palpations: Abdomen is soft. There is no hepatomegaly, splenomegaly or mass.     Tenderness: There is no abdominal tenderness.  Musculoskeletal:     Right lower leg: No edema.     Left lower leg: No edema.  Lymphadenopathy:     Cervical: No cervical adenopathy.     Right cervical: No superficial, deep or posterior cervical adenopathy.    Left cervical: No superficial, deep or posterior cervical adenopathy.      Upper Body:     Right upper body: No supraclavicular or axillary adenopathy.     Left upper body: No supraclavicular or axillary adenopathy.  Neurological:     General: No focal deficit present.     Mental Status: She is alert and oriented to person, place, and time.  Psychiatric:        Mood and Affect: Mood normal.        Behavior: Behavior normal.     LABS:      Latest Ref Rng & Units 03/03/2024   11:11 AM 02/28/2024    9:22 AM 01/24/2024    9:18 AM  CBC  WBC 4.0 - 10.5 K/uL 7.5  6.5  6.9   Hemoglobin 12.0 - 15.0 g/dL 88.6  89.3  89.7   Hematocrit 36.0 - 46.0 % 36.0  34.4  33.2   Platelets 150 - 400 K/uL 216  227  209       Latest Ref Rng & Units 03/03/2024   11:11 AM 02/28/2024    9:22 AM 01/24/2024    9:18 AM  CMP  Glucose 70 - 99 mg/dL 894  898  97   BUN 8 - 23 mg/dL 22  29  37   Creatinine 0.44 - 1.00 mg/dL 3.10  0.28  89.29   Sodium 135 - 145 mmol/L 136  139  140   Potassium 3.5 - 5.1 mmol/L 3.5  4.8  4.8   Chloride 98 - 111 mmol/L 90  94  99   CO2 22 - 32 mmol/L 32  28  26   Calcium  8.9 - 10.3 mg/dL 8.6  9.8  8.7   Total Protein 6.5 - 8.1 g/dL 8.0  7.7    Total Bilirubin 0.0 - 1.2 mg/dL 0.4  0.6    Alkaline Phos 38 - 126 U/L 120  87    AST 15 - 41 U/L 17  17    ALT 0 - 44 U/L 16  17       No results found for: CEA1, CEA / No results found for: CEA1, CEA No results found for: PSA1 No results found for: CAN199 No results found for: CAN125  No results found for: TOTALPROTELP, ALBUMINELP, A1GS, A2GS, BETS, BETA2SER, GAMS, MSPIKE, SPEI Lab Results  Component Value Date   TIBC 195 (L) 02/15/2021   TIBC 190 (L) 12/10/2018   TIBC 218 (L) 06/08/2016   FERRITIN 1,742 (H) 02/15/2021   FERRITIN 1,308 (H) 12/10/2018   FERRITIN 126 06/08/2016   IRONPCTSAT 15 02/15/2021   IRONPCTSAT 43 (H) 12/10/2018   IRONPCTSAT 22 06/08/2016   No results found for: LDH   STUDIES:   CT ABDOMEN PELVIS W CONTRAST Result Date:  03/03/2024 CLINICAL DATA:  Left lower quadrant pain with nausea. Breast cancer. * Tracking Code: BO * EXAM: CT ABDOMEN AND PELVIS WITH CONTRAST TECHNIQUE: Multidetector CT imaging of the abdomen and pelvis was performed using the standard protocol following bolus administration of intravenous contrast. RADIATION DOSE REDUCTION: This exam was performed according to the departmental dose-optimization program which includes automated exposure control, adjustment of the mA and/or kV according to patient size and/or use of iterative reconstruction technique. CONTRAST:  OMNIPAQUE  IOHEXOL  300 MG/ML  SOLN COMPARISON:  PET 01/31/2024, CT chest 01/24/2024 and CT chest abdomen pelvis 11/07/2023. 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 4 mm medial right lower lobe nodule (3/7) and 3 mm anterolateral right lower lobe nodule (3/5) appear new from 01/24/2024. No pleural fluid. Atherosclerotic calcification of the aorta with age advanced involvement of the coronary arteries. Heart is enlarged. No pericardial or pleural effusion. Distal esophagus is grossly unremarkable. Hepatobiliary: Liver is enlarged, 18.3 cm. Cholecystectomy. No biliary ductal dilatation. Pancreas: Negative. Spleen: Negative. Adrenals/Urinary Tract: Adrenal glands are unremarkable. Kidneys are atrophic and contain multiple low and high attenuation lesions, difficult to further characterize due to size. Multiple bilateral renal stones. Ureters are decompressed. Bladder is very low in volume. Stomach/Bowel: Stomach, small bowel and colon are unremarkable. Appendix is not readily visualized. Vascular/Lymphatic: Atherosclerotic calcification of the aorta. No pathologically enlarged lymph nodes. Reproductive: Hysterectomy.  No adnexal mass. Other: No free fluid.  Mesenteries and peritoneum are unremarkable. Musculoskeletal: No worrisome lytic or sclerotic lesions. IMPRESSION: 1. No findings to explain the patient's pain.  2. New right lower lobe nodules worrisome for metastatic disease. 3. Age advanced coronary artery calcification. 4. Mild hepatomegaly. 5. Multiple bilateral renal stones. 6. High and low-attenuation lesions in the kidneys are difficult to further characterize due to size and lack of precontrast imaging. Further evaluation with pre and post contrast MRI should be considered. Pre and post contrast CT could alternatively be performed, but would likely be of decreased accuracy given lesion size. Aortic atherosclerosis (ICD10-I70.0). Electronically Signed   By: Newell Eke M.D.   On: 03/03/2024 12:48   US  CORE BIOPSY (LYMPH NODES) Result Date: 02/26/2024 INDICATION: Left axillary PET positive adenopathy, history of breast cancer EXAM: ULTRASOUND CORE BIOPSY LEFT AXILLARY ADENOPATHY MEDICATIONS: 1% lidocaine  local ANESTHESIA/SEDATION: None. COMPLICATIONS: None immediate. PROCEDURE: Informed written consent was obtained from the patient after a thorough discussion of the procedural risks, benefits and alternatives. All questions were addressed. Maximal Sterile Barrier Technique was utilized including caps, mask, sterile gowns, sterile gloves, sterile drape, hand hygiene and skin antiseptic. A timeout was performed prior to the initiation of the procedure. Previous imaging reviewed. Preliminary ultrasound performed. Abnormal left axillary adenopathy was localized and marked. Under sterile conditions and local anesthesia, 18 gauge core biopsy needle was advanced to the adenopathy. 18 gauge core biopsies obtained under direct ultrasound. Images obtained for documentation. Samples placed in saline. These were intact and non fragmented. Postprocedure imaging demonstrates no hemorrhage or hematoma. Patient tolerated the biopsy well. IMPRESSION: Successful ultrasound core biopsy left axillary adenopathy. Electronically Signed   By: CHRISTELLA.  Shick M.D.   On: 02/26/2024 14:16

## 2024-03-19 NOTE — Patient Instructions (Addendum)
 [facility]  Discharge Instructions  You were seen and examined today by Dr. Rogers.  Dr. Katragadda discussed you going to see the team in [facility] for consult with the Radiologist, the surgeon and Olam Maffucci, MD the oncologist to get their recommendations of treatment plan.  Dr. Katragadda is sending in oxycodone  5 mg to the pharmacy.  Follow-up as scheduled.    Thank you for choosing [facility] to provide your oncology and hematology care.   To afford each patient quality time with our provider, please arrive at least 15 minutes before your scheduled appointment time. You may need to reschedule your appointment if you arrive late (10 or more minutes). Arriving late affects you and other patients whose appointments are after yours.  Also, if you miss three or more appointments without notifying the office, you may be dismissed from the clinic at the provider's discretion.    Again, thank you for choosing [facility].  Our hope is that these requests will decrease the amount of time that you wait before being seen by our physicians.   If you have a lab appointment with the Cancer Center - please note that after April 8th, all labs will be drawn in the cancer center.  You do not have to check in or register with the main entrance as you have in the past but will complete your check-in at the cancer center.            _____________________________________________________________  Should you have questions after your visit to [facility], please contact our office at [phone] and follow the prompts.  Our office hours are 8:00 a.m. to 4:30 p.m. Monday - Thursday and 8:00 a.m. to 2:30 p.m. Friday.  Please note that voicemails left after 4:00 p.m. may not be returned until the following business day.  We are closed weekends and all major holidays.  You do have access to a nurse 24-7, just call the main  number to the clinic [phone] and do not press any options, hold on the line and a nurse will answer the phone.    For prescription refill requests, have your pharmacy contact our office and allow 72 hours.    Masks are no longer required in the cancer centers. If you would like for your care team to wear a mask while they are taking care of you, please let them know. You may have one support person who is at least 62 years old accompany you for your appointments.

## 2024-03-20 ENCOUNTER — Encounter: Payer: Self-pay | Admitting: General Surgery

## 2024-03-20 ENCOUNTER — Ambulatory Visit (INDEPENDENT_AMBULATORY_CARE_PROVIDER_SITE_OTHER): Admitting: General Surgery

## 2024-03-20 VITALS — BP 146/80 | HR 82 | Temp 98.4°F | Resp 16 | Ht 61.0 in | Wt 183.0 lb

## 2024-03-20 DIAGNOSIS — C50912 Malignant neoplasm of unspecified site of left female breast: Secondary | ICD-10-CM

## 2024-03-20 NOTE — H&P (Signed)
 Megan [name]; [id number]; 06/21/1961   HPI Patient is a 63 year old black female well-known to me who was referred to my care by [facility] for Port-A-Cath placement.  She has had a recurrence of her left breast cancer and is going to [facility] for further evaluation and treatment.  It is assumed that she needs the Port-A-Cath placed for chemotherapy.  She has not seen [facility] yet. Past Medical History:  Diagnosis Date   (HFpEF) heart failure with preserved ejection fraction (HCC)    a. 01/2019 Echo: EF 55-60%, mild conc LVH. DD.  Torn MV chordae.   Anemia    Atypical chest pain    a. 08/2018 MV: EF 59%, no ischemia; b. 02/2019 Cath: nonobs dzs.   Blood transfusion without reported diagnosis    Breast cancer (HCC) 10/12/2020   Cataract    ESRD (end stage renal disease) on dialysis [facility])    a. HD T, T, S   Essential hypertension, benign    GERD (gastroesophageal reflux disease)    Headache    Hemorrhoids    Mixed hyperlipidemia    Morbid obesity (HCC)    Non-obstructive CAD (coronary artery disease)    a. 02/2019 CathL LM nl, LAD 56m, LCX nl, RCA 25p, EF 55-65%.   PONV (postoperative nausea and vomiting)    Renal insufficiency    S/P colonoscopy 08/2009   Dr. Rollin: sessile polyp (benign lymphoid), large hemorrhoids, repeat 5-10 years   Temporal arteritis (HCC)    Type 2 diabetes mellitus (HCC)    Wears glasses     Past Surgical History:  Procedure Laterality Date   ABDOMINAL HYSTERECTOMY     APPENDECTOMY     ARTERY BIOPSY N/A 05/09/2018   Procedure: RIGHT TEMPORAL ARTERY BIOPSY;  Surgeon: Kimble Agent, MD;  Location: [facility] OR;  Service: General;  Laterality: N/A;   BIOPSY  10/04/2023   Procedure: BIOPSY;  Surgeon: Shaaron Lamar HERO, MD;  [facility];  Service: Endoscopy;;   BREAST BIOPSY Right 06/15/2020   Procedure: RIGHT BREAST BIOPSY;  Surgeon: Mavis Anes, MD;  Location: [facility];  Service: General;  Laterality: Right;   CATARACT EXTRACTION W/PHACO  Left 02/09/2017   Procedure: CATARACT EXTRACTION PHACO AND INTRAOCULAR LENS PLACEMENT LEFT EYE;  Surgeon: Perley Hamilton, MD;  Location: [facility];  Service: Ophthalmology;  Laterality: Left;  CDE: 4.89   CATARACT EXTRACTION W/PHACO Right 06/04/2017   Procedure: CATARACT EXTRACTION PHACO AND INTRAOCULAR LENS PLACEMENT (IOC);  Surgeon: Perley Hamilton, MD;  Location: [facility];  Service: Ophthalmology;  Laterality: Right;  CDE: 4.12   CHOLECYSTECTOMY  09/29/2011   Procedure: LAPAROSCOPIC CHOLECYSTECTOMY;  Surgeon: Anes DELENA Mavis, MD;  Location: [facility];  Service: General;  Laterality: N/A;   COLONOSCOPY  08/2009   Dr. Rollin: sessile polyp (benign lymphoid), large hemorrhoids, repeat 5-10 years   COLONOSCOPY N/A 06/12/2016   prominent hemorrhoids   COLONOSCOPY WITH PROPOFOL  N/A 06/16/2021   Procedure: COLONOSCOPY WITH PROPOFOL ;  Surgeon: Shaaron Lamar HERO, MD;  [facility];  Service: Endoscopy;  Laterality: N/A;  9:30am (dialysis pt)   ESOPHAGOGASTRODUODENOSCOPY  09/05/2011   MFM:Dfjoo hiatal hernia; remainder of exam normal. No explanation for patient's abdominal pain with today's examination   ESOPHAGOGASTRODUODENOSCOPY N/A 12/17/2013   Dr. Shaaron: gastric erythema, erosion, mild chronic inflammation on path    ESOPHAGOGASTRODUODENOSCOPY N/A 12/31/2023   Procedure: EGD (ESOPHAGOGASTRODUODENOSCOPY);  Surgeon: Wilhelmenia Aloha Raddle., MD;  Location: [facility];  Service: Gastroenterology;  Laterality: N/A;   ESOPHAGOGASTRODUODENOSCOPY (EGD)  WITH PROPOFOL  N/A 06/16/2021   Procedure: ESOPHAGOGASTRODUODENOSCOPY (EGD) WITH PROPOFOL ;  Surgeon: Shaaron Lamar HERO, MD;  [facility];  Service: Endoscopy;  Laterality: N/A;   ESOPHAGOGASTRODUODENOSCOPY (EGD) WITH PROPOFOL  N/A 10/04/2023   Procedure: ESOPHAGOGASTRODUODENOSCOPY (EGD) WITH PROPOFOL ;  Surgeon: Shaaron Lamar HERO, MD;  [facility];  Service: Endoscopy;  Laterality: N/A;  200pm, ok rm 1-2, pt knows to arrive at 6:45   EUS N/A 12/31/2023    Procedure: ULTRASOUND, UPPER GI TRACT, ENDOSCOPIC;  Surgeon: Wilhelmenia Aloha Raddle., MD;  Location: [facility];  Service: Gastroenterology;  Laterality: N/A;   EXCISION OF BREAST BIOPSY Right 10/12/2020   Procedure: EXCISION OF RIGHT BREAST BIOPSY;  Surgeon: Mavis Anes, MD;  [facility]: [facility];  Service: General;  Laterality: Right;   IR DIALY SHUNT INTRO NEEDLE/INTRACATH INITIAL W/IMG LEFT Left 07/17/2023   LAPAROSCOPIC APPENDECTOMY  09/29/2011   Procedure: APPENDECTOMY LAPAROSCOPIC;  Surgeon: Anes DELENA Mavis, MD;  [facility]: [facility];  Service: General;;  incidental appendectomy   LEFT HEART CATH AND CORONARY ANGIOGRAPHY N/A 02/28/2019   Procedure: LEFT HEART CATH AND CORONARY ANGIOGRAPHY;  Surgeon: Dann Candyce RAMAN, MD;  Location: [facility] INVASIVE CV LAB;  Service: Cardiovascular;  Laterality: N/A;   MASS EXCISION Right 01/18/2023   Procedure: EXCISION MASS, RIGHT CHEST S/P MASTECTOMY;  Surgeon: Mavis Anes, MD;  [facility];  Service: General;  Laterality: Right;   MASTECTOMY MODIFIED RADICAL Right 02/18/2020   Procedure: MASTECTOMY MODIFIED RADICAL;  Surgeon: Mavis Anes, MD;  [facility];  Service: General;  Laterality: Right;   MASTECTOMY, PARTIAL Right 07/13/2020   Procedure: RIGHT PARTIAL MASTECTOMY;  Surgeon: Mavis Anes, MD;  [facility];  Service: General;  Laterality: Right;   PARTIAL MASTECTOMY WITH NEEDLE LOCALIZATION AND AXILLARY SENTINEL LYMPH NODE BX Right 09/18/2018   Procedure: RIGHT PARTIAL MASTECTOMY AFTER NEEDLE LOCALIZATION, SENTINEL LYMPH NODE BIOPSY RIGHT AXILLA;  Surgeon: Mavis Anes, MD;  [facility]: [facility];  Service: General;  Laterality: Right;   POLYPECTOMY  06/16/2021   Procedure: POLYPECTOMY;  Surgeon: Shaaron Lamar HERO, MD;  [facility];  Service: Endoscopy;;   PORT-A-CATH REMOVAL Right 11/29/2023   Procedure: REMOVAL PORT-A-CATH;  Surgeon: Mavis Anes, MD;  [facility];  Service: General;  Laterality: Right;  MINOR PROCEDURE  ROOM   PORTACATH PLACEMENT Right 06/07/2023   Procedure: INSERTION PORT-A-CATH, RIGHT  (DIALYSIS ACCESS ON LEFT);  Surgeon: Mavis Anes, MD;  [facility]: [facility];  Service: General;  Laterality: Right;   SIMPLE MASTECTOMY WITH AXILLARY SENTINEL NODE BIOPSY Left 06/15/2020   Procedure: LEFT SIMPLE MASTECTOMY;  Surgeon: Mavis Anes, MD;  [facility]: [facility];  Service: General;  Laterality: Left;    Family History  Problem Relation Age of Onset   Hypertension Mother    Coronary artery disease Mother    Diabetes Mother    Heart attack Father    Hypertension Sister    Coronary artery disease Sister    Hypertension Brother    Colon cancer Brother    Heart attack Maternal Grandmother    Heart attack Maternal Grandfather    Heart attack Paternal Grandmother    Heart attack Paternal Grandfather    Hypertension Son    Heart attack Maternal Aunt    Hypertension Maternal Aunt    Diabetes Maternal Aunt    Heart attack Maternal Uncle    Hypertension Maternal Uncle    Diabetes Maternal Uncle    Heart attack Paternal Aunt    Hypertension Paternal Aunt    Diabetes  Paternal Aunt    Heart attack Paternal Uncle    Hypertension Paternal Uncle    Diabetes Paternal Uncle     Current Outpatient Medications on File Prior to Visit  Medication Sig Dispense Refill   amLODipine  (NORVASC ) 5 MG tablet Take 5 mg by mouth at bedtime.     aspirin  EC 81 MG tablet Take 81 mg by mouth daily.     atorvastatin  (LIPITOR) 20 MG tablet TAKE 1 TABLET(20 MG) BY MOUTH DAILY 90 tablet 0   B Complex-C-Zn-Folic Acid  (DIALYVITE  800-ZINC  15) 0.8 MG TABS Take 1 tablet by mouth daily.     cinacalcet  (SENSIPAR ) 60 MG tablet Take 60 mg by mouth daily.     cinacalcet  (SENSIPAR ) 90 MG tablet SMARTSIG:1 Tablet(s) By Mouth Every Evening     Darbepoetin Alfa  (ARANESP , ALBUMIN FREE, IJ) Darbepoetin Alfa  (Aranesp )     fluticasone  (FLONASE ) 50 MCG/ACT nasal spray Place 1 spray into both nostrils daily.     gabapentin  (NEURONTIN ) 300  MG capsule TAKE 1 CAPSULE(300 MG) BY MOUTH AT BEDTIME 90 capsule 3   HYDROcodone -acetaminophen  (NORCO/VICODIN) 5-325 MG tablet Take 1 tablet by mouth every 6 (six) hours as needed. 12 tablet 0   iron  sucrose (VENOFER ) 20 MG/ML injection Inject 50 mg into the vein once a week.     lanthanum  (FOSRENOL ) 1000 MG chewable tablet Chew 2,000-3,000 mg by mouth See admin instructions. Take 3 tablets (3000 mg) by mouth with meals and take 2 tablets (2000 mg) with snacks     lidocaine -prilocaine  (EMLA ) cream APPLY TOPICALLY TO THE AFFECTED AREA 1 TIME 30 g 3   meclizine  (ANTIVERT ) 25 MG tablet Take 1 tablet (25 mg total) by mouth 3 (three) times daily as needed for dizziness. 30 tablet 1   nebivolol  (BYSTOLIC ) 10 MG tablet TAKE 1 TABLET BY MOUTH IN THE MORNING AND AT BEDTIME 180 tablet 0   nitroGLYCERIN  (NITROSTAT ) 0.4 MG SL tablet Place 1 tablet (0.4 mg total) under the tongue every 5 (five) minutes x 3 doses as needed for chest pain (if no relief after 3rd dose, proceed to ED or call 911). 25 tablet 3   ondansetron  (ZOFRAN ) 4 MG tablet Take 1 tablet (4 mg total) by mouth every 6 (six) hours. 12 tablet 0   oxyCODONE  (ROXICODONE ) 5 MG immediate release tablet Take 1 tablet (5 mg total) by mouth every 12 (twelve) hours as needed. 60 tablet 0   pantoprazole  (PROTONIX ) 40 MG tablet Take 1 tablet (40 mg total) by mouth 2 (two) times daily before a meal. (Patient taking differently: Take 40 mg by mouth daily.) 60 tablet 11   No current facility-administered medications on file prior to visit.    Allergies  Allergen Reactions   Motrin [Ibuprofen] Other (See Comments)    ESRD     Social History   Substance and Sexual Activity  Alcohol Use No    Social History   Tobacco Use  Smoking Status Never   Passive exposure: Never  Smokeless Tobacco Never    Review of Systems  Constitutional:  Positive for malaise/fatigue.  HENT: Negative.    Eyes: Negative.   Respiratory: Negative.    Cardiovascular:  Negative.   Gastrointestinal: Negative.   Genitourinary: Negative.   Musculoskeletal: Negative.   Skin: Negative.   Neurological: Negative.   Psychiatric/Behavioral: Negative.      Objective   Vitals:   03/20/24 0856  BP: (!) 146/80  Pulse: 82  Resp: 16  Temp: 98.4 F (36.9 C)  SpO2:  96%    Physical Exam Vitals reviewed.  Constitutional:      Appearance: Normal appearance. She is not ill-appearing.  HENT:     Head: Normocephalic and atraumatic.  Cardiovascular:     Rate and Rhythm: Normal rate and regular rhythm.     Heart sounds: Normal heart sounds. No murmur heard.    No friction rub. No gallop.  Pulmonary:     Effort: Pulmonary effort is normal. No respiratory distress.     Breath sounds: Normal breath sounds. No stridor. No wheezing, rhonchi or rales.  Skin:    General: Skin is warm and dry.  Neurological:     Mental Status: She is alert and oriented to person, place, and time.   Dr. Charmain notes reviewed  Assessment  Recurrent left breast cancer, need for central venous access Plan  Patient is scheduled for portacath insertion on 04/03/2024.  The risks and benefits of the procedure including bleeding, infection, and pneumothorax were fully explained to the patient, who gave informed consent.

## 2024-03-20 NOTE — Progress Notes (Signed)
 Megan [name]; [id number]; 06/21/1961   HPI Patient is a 63 year old black female well-known to me who was referred to my care by [facility] for Port-A-Cath placement.  She has had a recurrence of her left breast cancer and is going to [facility] for further evaluation and treatment.  It is assumed that she needs the Port-A-Cath placed for chemotherapy.  She has not seen [facility] yet. Past Medical History:  Diagnosis Date   (HFpEF) heart failure with preserved ejection fraction (HCC)    a. 01/2019 Echo: EF 55-60%, mild conc LVH. DD.  Torn MV chordae.   Anemia    Atypical chest pain    a. 08/2018 MV: EF 59%, no ischemia; b. 02/2019 Cath: nonobs dzs.   Blood transfusion without reported diagnosis    Breast cancer (HCC) 10/12/2020   Cataract    ESRD (end stage renal disease) on dialysis [facility])    a. HD T, T, S   Essential hypertension, benign    GERD (gastroesophageal reflux disease)    Headache    Hemorrhoids    Mixed hyperlipidemia    Morbid obesity (HCC)    Non-obstructive CAD (coronary artery disease)    a. 02/2019 CathL LM nl, LAD 56m, LCX nl, RCA 25p, EF 55-65%.   PONV (postoperative nausea and vomiting)    Renal insufficiency    S/P colonoscopy 08/2009   Dr. Rollin: sessile polyp (benign lymphoid), large hemorrhoids, repeat 5-10 years   Temporal arteritis (HCC)    Type 2 diabetes mellitus (HCC)    Wears glasses     Past Surgical History:  Procedure Laterality Date   ABDOMINAL HYSTERECTOMY     APPENDECTOMY     ARTERY BIOPSY N/A 05/09/2018   Procedure: RIGHT TEMPORAL ARTERY BIOPSY;  Surgeon: Kimble Agent, MD;  Location: [facility] OR;  Service: General;  Laterality: N/A;   BIOPSY  10/04/2023   Procedure: BIOPSY;  Surgeon: Shaaron Lamar HERO, MD;  [facility];  Service: Endoscopy;;   BREAST BIOPSY Right 06/15/2020   Procedure: RIGHT BREAST BIOPSY;  Surgeon: Mavis Anes, MD;  Location: [facility];  Service: General;  Laterality: Right;   CATARACT EXTRACTION W/PHACO  Left 02/09/2017   Procedure: CATARACT EXTRACTION PHACO AND INTRAOCULAR LENS PLACEMENT LEFT EYE;  Surgeon: Perley Hamilton, MD;  Location: [facility];  Service: Ophthalmology;  Laterality: Left;  CDE: 4.89   CATARACT EXTRACTION W/PHACO Right 06/04/2017   Procedure: CATARACT EXTRACTION PHACO AND INTRAOCULAR LENS PLACEMENT (IOC);  Surgeon: Perley Hamilton, MD;  Location: [facility];  Service: Ophthalmology;  Laterality: Right;  CDE: 4.12   CHOLECYSTECTOMY  09/29/2011   Procedure: LAPAROSCOPIC CHOLECYSTECTOMY;  Surgeon: Anes DELENA Mavis, MD;  Location: [facility];  Service: General;  Laterality: N/A;   COLONOSCOPY  08/2009   Dr. Rollin: sessile polyp (benign lymphoid), large hemorrhoids, repeat 5-10 years   COLONOSCOPY N/A 06/12/2016   prominent hemorrhoids   COLONOSCOPY WITH PROPOFOL  N/A 06/16/2021   Procedure: COLONOSCOPY WITH PROPOFOL ;  Surgeon: Shaaron Lamar HERO, MD;  [facility];  Service: Endoscopy;  Laterality: N/A;  9:30am (dialysis pt)   ESOPHAGOGASTRODUODENOSCOPY  09/05/2011   MFM:Dfjoo hiatal hernia; remainder of exam normal. No explanation for patient's abdominal pain with today's examination   ESOPHAGOGASTRODUODENOSCOPY N/A 12/17/2013   Dr. Shaaron: gastric erythema, erosion, mild chronic inflammation on path    ESOPHAGOGASTRODUODENOSCOPY N/A 12/31/2023   Procedure: EGD (ESOPHAGOGASTRODUODENOSCOPY);  Surgeon: Wilhelmenia Aloha Raddle., MD;  Location: [facility];  Service: Gastroenterology;  Laterality: N/A;   ESOPHAGOGASTRODUODENOSCOPY (EGD)  WITH PROPOFOL  N/A 06/16/2021   Procedure: ESOPHAGOGASTRODUODENOSCOPY (EGD) WITH PROPOFOL ;  Surgeon: Shaaron Lamar HERO, MD;  [facility];  Service: Endoscopy;  Laterality: N/A;   ESOPHAGOGASTRODUODENOSCOPY (EGD) WITH PROPOFOL  N/A 10/04/2023   Procedure: ESOPHAGOGASTRODUODENOSCOPY (EGD) WITH PROPOFOL ;  Surgeon: Shaaron Lamar HERO, MD;  [facility];  Service: Endoscopy;  Laterality: N/A;  200pm, ok rm 1-2, pt knows to arrive at 6:45   EUS N/A 12/31/2023    Procedure: ULTRASOUND, UPPER GI TRACT, ENDOSCOPIC;  Surgeon: Wilhelmenia Aloha Raddle., MD;  Location: [facility];  Service: Gastroenterology;  Laterality: N/A;   EXCISION OF BREAST BIOPSY Right 10/12/2020   Procedure: EXCISION OF RIGHT BREAST BIOPSY;  Surgeon: Mavis Anes, MD;  Location: [facility];  Service: General;  Laterality: Right;   IR DIALY SHUNT INTRO NEEDLE/INTRACATH INITIAL W/IMG LEFT Left 07/17/2023   LAPAROSCOPIC APPENDECTOMY  09/29/2011   Procedure: APPENDECTOMY LAPAROSCOPIC;  Surgeon: Anes DELENA Mavis, MD;  Location: [facility];  Service: General;;  incidental appendectomy   LEFT HEART CATH AND CORONARY ANGIOGRAPHY N/A 02/28/2019   Procedure: LEFT HEART CATH AND CORONARY ANGIOGRAPHY;  Surgeon: Dann Candyce RAMAN, MD;  Location: [facility] INVASIVE CV LAB;  Service: Cardiovascular;  Laterality: N/A;   MASS EXCISION Right 01/18/2023   Procedure: EXCISION MASS, RIGHT CHEST S/P MASTECTOMY;  Surgeon: Mavis Anes, MD;  Location: [facility];  Service: General;  Laterality: Right;   MASTECTOMY MODIFIED RADICAL Right 02/18/2020   Procedure: MASTECTOMY MODIFIED RADICAL;  Surgeon: Mavis Anes, MD;  [facility]: [facility];  Service: General;  Laterality: Right;   MASTECTOMY, PARTIAL Right 07/13/2020   Procedure: RIGHT PARTIAL MASTECTOMY;  Surgeon: Mavis Anes, MD;  [facility]: [facility];  Service: General;  Laterality: Right;   PARTIAL MASTECTOMY WITH NEEDLE LOCALIZATION AND AXILLARY SENTINEL LYMPH NODE BX Right 09/18/2018   Procedure: RIGHT PARTIAL MASTECTOMY AFTER NEEDLE LOCALIZATION, SENTINEL LYMPH NODE BIOPSY RIGHT AXILLA;  Surgeon: Mavis Anes, MD;  [facility]: [facility];  Service: General;  Laterality: Right;   POLYPECTOMY  06/16/2021   Procedure: POLYPECTOMY;  Surgeon: Shaaron Lamar HERO, MD;  [facility];  Service: Endoscopy;;   PORT-A-CATH REMOVAL Right 11/29/2023   Procedure: REMOVAL PORT-A-CATH;  Surgeon: Mavis Anes, MD;  Location: [facility];  Service: General;  Laterality: Right;  MINOR PROCEDURE  ROOM   PORTACATH PLACEMENT Right 06/07/2023   Procedure: INSERTION PORT-A-CATH, RIGHT  (DIALYSIS ACCESS ON LEFT);  Surgeon: Mavis Anes, MD;  Location: [facility];  Service: General;  Laterality: Right;   SIMPLE MASTECTOMY WITH AXILLARY SENTINEL NODE BIOPSY Left 06/15/2020   Procedure: LEFT SIMPLE MASTECTOMY;  Surgeon: Mavis Anes, MD;  [facility]: [facility];  Service: General;  Laterality: Left;    Family History  Problem Relation Age of Onset   Hypertension Mother    Coronary artery disease Mother    Diabetes Mother    Heart attack Father    Hypertension Sister    Coronary artery disease Sister    Hypertension Brother    Colon cancer Brother    Heart attack Maternal Grandmother    Heart attack Maternal Grandfather    Heart attack Paternal Grandmother    Heart attack Paternal Grandfather    Hypertension Son    Heart attack Maternal Aunt    Hypertension Maternal Aunt    Diabetes Maternal Aunt    Heart attack Maternal Uncle    Hypertension Maternal Uncle    Diabetes Maternal Uncle    Heart attack Paternal Aunt    Hypertension Paternal Aunt    Diabetes  Paternal Aunt    Heart attack Paternal Uncle    Hypertension Paternal Uncle    Diabetes Paternal Uncle     Current Outpatient Medications on File Prior to Visit  Medication Sig Dispense Refill   amLODipine  (NORVASC ) 5 MG tablet Take 5 mg by mouth at bedtime.     aspirin  EC 81 MG tablet Take 81 mg by mouth daily.     atorvastatin  (LIPITOR) 20 MG tablet TAKE 1 TABLET(20 MG) BY MOUTH DAILY 90 tablet 0   B Complex-C-Zn-Folic Acid  (DIALYVITE  800-ZINC  15) 0.8 MG TABS Take 1 tablet by mouth daily.     cinacalcet  (SENSIPAR ) 60 MG tablet Take 60 mg by mouth daily.     cinacalcet  (SENSIPAR ) 90 MG tablet SMARTSIG:1 Tablet(s) By Mouth Every Evening     Darbepoetin Alfa  (ARANESP , ALBUMIN FREE, IJ) Darbepoetin Alfa  (Aranesp )     fluticasone  (FLONASE ) 50 MCG/ACT nasal spray Place 1 spray into both nostrils daily.     gabapentin  (NEURONTIN ) 300  MG capsule TAKE 1 CAPSULE(300 MG) BY MOUTH AT BEDTIME 90 capsule 3   HYDROcodone -acetaminophen  (NORCO/VICODIN) 5-325 MG tablet Take 1 tablet by mouth every 6 (six) hours as needed. 12 tablet 0   iron  sucrose (VENOFER ) 20 MG/ML injection Inject 50 mg into the vein once a week.     lanthanum  (FOSRENOL ) 1000 MG chewable tablet Chew 2,000-3,000 mg by mouth See admin instructions. Take 3 tablets (3000 mg) by mouth with meals and take 2 tablets (2000 mg) with snacks     lidocaine -prilocaine  (EMLA ) cream APPLY TOPICALLY TO THE AFFECTED AREA 1 TIME 30 g 3   meclizine  (ANTIVERT ) 25 MG tablet Take 1 tablet (25 mg total) by mouth 3 (three) times daily as needed for dizziness. 30 tablet 1   nebivolol  (BYSTOLIC ) 10 MG tablet TAKE 1 TABLET BY MOUTH IN THE MORNING AND AT BEDTIME 180 tablet 0   nitroGLYCERIN  (NITROSTAT ) 0.4 MG SL tablet Place 1 tablet (0.4 mg total) under the tongue every 5 (five) minutes x 3 doses as needed for chest pain (if no relief after 3rd dose, proceed to ED or call 911). 25 tablet 3   ondansetron  (ZOFRAN ) 4 MG tablet Take 1 tablet (4 mg total) by mouth every 6 (six) hours. 12 tablet 0   oxyCODONE  (ROXICODONE ) 5 MG immediate release tablet Take 1 tablet (5 mg total) by mouth every 12 (twelve) hours as needed. 60 tablet 0   pantoprazole  (PROTONIX ) 40 MG tablet Take 1 tablet (40 mg total) by mouth 2 (two) times daily before a meal. (Patient taking differently: Take 40 mg by mouth daily.) 60 tablet 11   No current facility-administered medications on file prior to visit.    Allergies  Allergen Reactions   Motrin [Ibuprofen] Other (See Comments)    ESRD     Social History   Substance and Sexual Activity  Alcohol Use No    Social History   Tobacco Use  Smoking Status Never   Passive exposure: Never  Smokeless Tobacco Never    Review of Systems  Constitutional:  Positive for malaise/fatigue.  HENT: Negative.    Eyes: Negative.   Respiratory: Negative.    Cardiovascular:  Negative.   Gastrointestinal: Negative.   Genitourinary: Negative.   Musculoskeletal: Negative.   Skin: Negative.   Neurological: Negative.   Psychiatric/Behavioral: Negative.      Objective   Vitals:   03/20/24 0856  BP: (!) 146/80  Pulse: 82  Resp: 16  Temp: 98.4 F (36.9 C)  SpO2:  96%    Physical Exam Vitals reviewed.  Constitutional:      Appearance: Normal appearance. She is not ill-appearing.  HENT:     Head: Normocephalic and atraumatic.  Cardiovascular:     Rate and Rhythm: Normal rate and regular rhythm.     Heart sounds: Normal heart sounds. No murmur heard.    No friction rub. No gallop.  Pulmonary:     Effort: Pulmonary effort is normal. No respiratory distress.     Breath sounds: Normal breath sounds. No stridor. No wheezing, rhonchi or rales.  Skin:    General: Skin is warm and dry.  Neurological:     Mental Status: She is alert and oriented to person, place, and time.   Dr. Charmain notes reviewed  Assessment  Recurrent left breast cancer, need for central venous access Plan  Patient is scheduled for portacath insertion on 04/03/2024.  The risks and benefits of the procedure including bleeding, infection, and pneumothorax were fully explained to the patient, who gave informed consent.

## 2024-03-20 NOTE — Addendum Note (Signed)
 Addended by: SAUNDRA TAWNI DEL on: 03/20/2024 11:20 AM   Modules accepted: Orders

## 2024-03-21 ENCOUNTER — Other Ambulatory Visit: Payer: Self-pay

## 2024-03-21 DIAGNOSIS — D631 Anemia in chronic kidney disease: Secondary | ICD-10-CM | POA: Diagnosis not present

## 2024-03-21 DIAGNOSIS — D509 Iron deficiency anemia, unspecified: Secondary | ICD-10-CM | POA: Diagnosis not present

## 2024-03-21 DIAGNOSIS — N2581 Secondary hyperparathyroidism of renal origin: Secondary | ICD-10-CM | POA: Diagnosis not present

## 2024-03-21 DIAGNOSIS — N186 End stage renal disease: Secondary | ICD-10-CM | POA: Diagnosis not present

## 2024-03-21 DIAGNOSIS — E1129 Type 2 diabetes mellitus with other diabetic kidney complication: Secondary | ICD-10-CM | POA: Diagnosis not present

## 2024-03-21 DIAGNOSIS — Z992 Dependence on renal dialysis: Secondary | ICD-10-CM | POA: Diagnosis not present

## 2024-03-24 DIAGNOSIS — N2581 Secondary hyperparathyroidism of renal origin: Secondary | ICD-10-CM | POA: Diagnosis not present

## 2024-03-24 DIAGNOSIS — E1129 Type 2 diabetes mellitus with other diabetic kidney complication: Secondary | ICD-10-CM | POA: Diagnosis not present

## 2024-03-24 DIAGNOSIS — Z992 Dependence on renal dialysis: Secondary | ICD-10-CM | POA: Diagnosis not present

## 2024-03-24 DIAGNOSIS — D509 Iron deficiency anemia, unspecified: Secondary | ICD-10-CM | POA: Diagnosis not present

## 2024-03-24 DIAGNOSIS — D631 Anemia in chronic kidney disease: Secondary | ICD-10-CM | POA: Diagnosis not present

## 2024-03-24 DIAGNOSIS — N186 End stage renal disease: Secondary | ICD-10-CM | POA: Diagnosis not present

## 2024-03-26 ENCOUNTER — Encounter: Payer: Self-pay | Admitting: Licensed Clinical Social Worker

## 2024-03-26 DIAGNOSIS — I2510 Atherosclerotic heart disease of native coronary artery without angina pectoris: Secondary | ICD-10-CM | POA: Diagnosis not present

## 2024-03-26 DIAGNOSIS — C50211 Malignant neoplasm of upper-inner quadrant of right female breast: Secondary | ICD-10-CM | POA: Diagnosis not present

## 2024-03-26 DIAGNOSIS — D509 Iron deficiency anemia, unspecified: Secondary | ICD-10-CM | POA: Diagnosis not present

## 2024-03-26 DIAGNOSIS — N186 End stage renal disease: Secondary | ICD-10-CM | POA: Diagnosis not present

## 2024-03-26 DIAGNOSIS — N2581 Secondary hyperparathyroidism of renal origin: Secondary | ICD-10-CM | POA: Diagnosis not present

## 2024-03-26 DIAGNOSIS — D631 Anemia in chronic kidney disease: Secondary | ICD-10-CM | POA: Diagnosis not present

## 2024-03-26 DIAGNOSIS — G63 Polyneuropathy in diseases classified elsewhere: Secondary | ICD-10-CM | POA: Diagnosis not present

## 2024-03-26 DIAGNOSIS — Z992 Dependence on renal dialysis: Secondary | ICD-10-CM | POA: Diagnosis not present

## 2024-03-26 DIAGNOSIS — C50911 Malignant neoplasm of unspecified site of right female breast: Secondary | ICD-10-CM | POA: Diagnosis not present

## 2024-03-26 DIAGNOSIS — Z1379 Encounter for other screening for genetic and chromosomal anomalies: Secondary | ICD-10-CM | POA: Insufficient documentation

## 2024-03-26 DIAGNOSIS — C773 Secondary and unspecified malignant neoplasm of axilla and upper limb lymph nodes: Secondary | ICD-10-CM | POA: Diagnosis not present

## 2024-03-26 DIAGNOSIS — E1129 Type 2 diabetes mellitus with other diabetic kidney complication: Secondary | ICD-10-CM | POA: Diagnosis not present

## 2024-03-27 ENCOUNTER — Encounter (HOSPITAL_COMMUNITY): Payer: Self-pay

## 2024-03-28 ENCOUNTER — Encounter: Payer: Self-pay | Admitting: Cardiology

## 2024-03-28 ENCOUNTER — Ambulatory Visit: Attending: Cardiology | Admitting: Cardiology

## 2024-03-28 ENCOUNTER — Other Ambulatory Visit: Payer: Self-pay

## 2024-03-28 VITALS — BP 132/70 | HR 80 | Ht 61.5 in | Wt 181.8 lb

## 2024-03-28 DIAGNOSIS — I10 Essential (primary) hypertension: Secondary | ICD-10-CM | POA: Insufficient documentation

## 2024-03-28 DIAGNOSIS — N186 End stage renal disease: Secondary | ICD-10-CM | POA: Diagnosis not present

## 2024-03-28 DIAGNOSIS — D509 Iron deficiency anemia, unspecified: Secondary | ICD-10-CM | POA: Diagnosis not present

## 2024-03-28 DIAGNOSIS — R0789 Other chest pain: Secondary | ICD-10-CM | POA: Diagnosis not present

## 2024-03-28 DIAGNOSIS — D631 Anemia in chronic kidney disease: Secondary | ICD-10-CM | POA: Diagnosis not present

## 2024-03-28 DIAGNOSIS — E782 Mixed hyperlipidemia: Secondary | ICD-10-CM | POA: Insufficient documentation

## 2024-03-28 DIAGNOSIS — Z79899 Other long term (current) drug therapy: Secondary | ICD-10-CM | POA: Diagnosis not present

## 2024-03-28 DIAGNOSIS — E1129 Type 2 diabetes mellitus with other diabetic kidney complication: Secondary | ICD-10-CM | POA: Diagnosis not present

## 2024-03-28 DIAGNOSIS — N2581 Secondary hyperparathyroidism of renal origin: Secondary | ICD-10-CM | POA: Diagnosis not present

## 2024-03-28 DIAGNOSIS — Z992 Dependence on renal dialysis: Secondary | ICD-10-CM | POA: Diagnosis not present

## 2024-03-28 MED ORDER — ISOSORBIDE MONONITRATE ER 30 MG PO TB24: 15.0000 mg | ORAL_TABLET | Freq: Every day | ORAL | 6 refills | Status: DC

## 2024-03-28 NOTE — Addendum Note (Signed)
 Addended by: Garry Nicolini G on: 03/28/2024 11:54 AM   Modules accepted: Orders

## 2024-03-28 NOTE — Patient Instructions (Signed)
 Medication Instructions:   Begin Imdur  15mg  daily Continue all other medications.     Labwork:  FLP - order given today Reminder:  Nothing to eat or drink after 12 midnight prior to labs. Office will contact with results via phone, letter or mychart.     Testing/Procedures:  none  Follow-Up:  6 months   Any Other Special Instructions Will Be Listed Below (If Applicable).   If you need a refill on your cardiac medications before your next appointment, please call your pharmacy.

## 2024-03-28 NOTE — Progress Notes (Signed)
 Clinical Summary Megan [name] is a 63 y.o.female seen today for follow up of the following medical problems.      1.Chest pain - long history of chest pain   2018 nuclear stress: normal perfusion Jan 2020 nuclear stress no ischemia - 02/2019 cath: prox RCA 25%, mid LAD 10%, otherwise normal vessels   ER visit 11/17/21 with chest pain - trop neg x2, EKG SR without ischemic changes - pressure midchest. 6/10 in severity. Can occur at rest or with activity. No other associated symptoms. Lasts few seconds. Not positional. Not related to eating. - similar to prior chest pains.     ER visit 02/2022 with headache, chest pain -trops neg x 2.  - initially chest pains had improved on imudr, but recurrence - pcp increased imdur  to 30mg  daily.      ER visit jan 2024 with chest pain - trops neg, EKG SR no specific ischemic changes -chronic chest pains similar to prior symptoms - at home at rest onset of pain, started right sided aching pain. Very severe pain, some SOB. Lightheaded. Pain not positional. Lasted a few hours. Pain improved with fentanyl      Right sdied pain at recent surgical site for recurrent breast cancer, no other symptoms  04/2023 nuclear stress: no ischemia - pcp had stopped imdur  due to dizziness. Dizziness much improved - increased pain off medication.       2.. HTN - compliant with meds   3. ESRD   4. Breast cancer - from notes recurrent breast CA, had chest wall cancer excision surgery 01/18/23 - followed by oncology   5. Hyperlipidemia - reports recent labs at HD - overdue for labs   Past Medical History:  Diagnosis Date   (HFpEF) heart failure with preserved ejection fraction (HCC)    a. 01/2019 Echo: EF 55-60%, mild conc LVH. DD.  Torn MV chordae.   Anemia    Atypical chest pain    a. 08/2018 MV: EF 59%, no ischemia; b. 02/2019 Cath: nonobs dzs.   Blood transfusion without reported diagnosis    Breast cancer (HCC) 10/12/2020   Cataract    ESRD  (end stage renal disease) on dialysis [facility])    a. HD T, T, S   Essential hypertension, benign    GERD (gastroesophageal reflux disease)    Headache    Hemorrhoids    Mixed hyperlipidemia    Morbid obesity (HCC)    Non-obstructive CAD (coronary artery disease)    a. 02/2019 CathL LM nl, LAD 15m, LCX nl, RCA 25p, EF 55-65%.   PONV (postoperative nausea and vomiting)    Renal insufficiency    S/P colonoscopy 08/2009   Dr. Rollin: sessile polyp (benign lymphoid), large hemorrhoids, repeat 5-10 years   Temporal arteritis (HCC)    Type 2 diabetes mellitus (HCC)    Wears glasses      Allergies  Allergen Reactions   Motrin [Ibuprofen] Other (See Comments)    ESRD      Current Outpatient Medications  Medication Sig Dispense Refill   amLODipine  (NORVASC ) 5 MG tablet Take 5 mg by mouth at bedtime.     aspirin  EC 81 MG tablet Take 81 mg by mouth daily.     atorvastatin  (LIPITOR) 20 MG tablet TAKE 1 TABLET(20 MG) BY MOUTH DAILY 90 tablet 0   B Complex-C-Zn-Folic Acid  (DIALYVITE  800-ZINC  15) 0.8 MG TABS Take 1 tablet by mouth daily.     cinacalcet  (SENSIPAR ) 60 MG tablet Take 60  mg by mouth daily.     cinacalcet  (SENSIPAR ) 90 MG tablet SMARTSIG:1 Tablet(s) By Mouth Every Evening     Darbepoetin Alfa  (ARANESP , ALBUMIN FREE, IJ) Darbepoetin Alfa  (Aranesp )     fluticasone  (FLONASE ) 50 MCG/ACT nasal spray Place 1 spray into both nostrils daily.     gabapentin  (NEURONTIN ) 300 MG capsule TAKE 1 CAPSULE(300 MG) BY MOUTH AT BEDTIME 90 capsule 3   HYDROcodone -acetaminophen  (NORCO/VICODIN) 5-325 MG tablet Take 1 tablet by mouth every 6 (six) hours as needed. 12 tablet 0   iron  sucrose (VENOFER ) 20 MG/ML injection Inject 50 mg into the vein once a week.     lanthanum  (FOSRENOL ) 1000 MG chewable tablet Chew 2,000-3,000 mg by mouth See admin instructions. Take 3 tablets (3000 mg) by mouth with meals and take 2 tablets (2000 mg) with snacks     lidocaine -prilocaine  (EMLA ) cream APPLY TOPICALLY TO THE  AFFECTED AREA 1 TIME 30 g 3   meclizine  (ANTIVERT ) 25 MG tablet Take 1 tablet (25 mg total) by mouth 3 (three) times daily as needed for dizziness. 30 tablet 1   nebivolol  (BYSTOLIC ) 10 MG tablet TAKE 1 TABLET BY MOUTH IN THE MORNING AND AT BEDTIME 180 tablet 0   nitroGLYCERIN  (NITROSTAT ) 0.4 MG SL tablet Place 1 tablet (0.4 mg total) under the tongue every 5 (five) minutes x 3 doses as needed for chest pain (if no relief after 3rd dose, proceed to ED or call 911). 25 tablet 3   ondansetron  (ZOFRAN ) 4 MG tablet Take 1 tablet (4 mg total) by mouth every 6 (six) hours. 12 tablet 0   oxyCODONE  (ROXICODONE ) 5 MG immediate release tablet Take 1 tablet (5 mg total) by mouth every 12 (twelve) hours as needed. 60 tablet 0   pantoprazole  (PROTONIX ) 40 MG tablet Take 1 tablet (40 mg total) by mouth 2 (two) times daily before a meal. (Patient taking differently: Take 40 mg by mouth daily.) 60 tablet 11   No current facility-administered medications for this visit.     Past Surgical History:  Procedure Laterality Date   ABDOMINAL HYSTERECTOMY     APPENDECTOMY     ARTERY BIOPSY N/A 05/09/2018   Procedure: RIGHT TEMPORAL ARTERY BIOPSY;  Surgeon: Kimble Agent, MD;  Location: [facility] OR;  Service: General;  Laterality: N/A;   BIOPSY  10/04/2023   Procedure: BIOPSY;  Surgeon: Shaaron Lamar HERO, MD;  [facility];  Service: Endoscopy;;   BREAST BIOPSY Right 06/15/2020   Procedure: RIGHT BREAST BIOPSY;  Surgeon: Mavis Anes, MD;  Location: [facility];  Service: General;  Laterality: Right;   CATARACT EXTRACTION W/PHACO Left 02/09/2017   Procedure: CATARACT EXTRACTION PHACO AND INTRAOCULAR LENS PLACEMENT LEFT EYE;  Surgeon: Perley Hamilton, MD;  Location: [facility];  Service: Ophthalmology;  Laterality: Left;  CDE: 4.89   CATARACT EXTRACTION W/PHACO Right 06/04/2017   Procedure: CATARACT EXTRACTION PHACO AND INTRAOCULAR LENS PLACEMENT (IOC);  Surgeon: Perley Hamilton, MD;  Location: [facility];  Service: Ophthalmology;   Laterality: Right;  CDE: 4.12   CHOLECYSTECTOMY  09/29/2011   Procedure: LAPAROSCOPIC CHOLECYSTECTOMY;  Surgeon: Anes DELENA Mavis, MD;  Location: [facility];  Service: General;  Laterality: N/A;   COLONOSCOPY  08/2009   Dr. Rollin: sessile polyp (benign lymphoid), large hemorrhoids, repeat 5-10 years   COLONOSCOPY N/A 06/12/2016   prominent hemorrhoids   COLONOSCOPY WITH PROPOFOL  N/A 06/16/2021   Procedure: COLONOSCOPY WITH PROPOFOL ;  Surgeon: Shaaron Lamar HERO, MD;  [facility];  Service: Endoscopy;  Laterality: N/A;  9:30am (dialysis pt)   ESOPHAGOGASTRODUODENOSCOPY  09/05/2011   MFM:Dfjoo hiatal hernia; remainder of exam normal. No explanation for patient's abdominal pain with today's examination   ESOPHAGOGASTRODUODENOSCOPY N/A 12/17/2013   Dr. Shaaron: gastric erythema, erosion, mild chronic inflammation on path    ESOPHAGOGASTRODUODENOSCOPY N/A 12/31/2023   Procedure: EGD (ESOPHAGOGASTRODUODENOSCOPY);  Surgeon: Wilhelmenia Aloha Raddle., MD;  Location: [facility];  Service: Gastroenterology;  Laterality: N/A;   ESOPHAGOGASTRODUODENOSCOPY (EGD) WITH PROPOFOL  N/A 06/16/2021   Procedure: ESOPHAGOGASTRODUODENOSCOPY (EGD) WITH PROPOFOL ;  Surgeon: Shaaron Lamar HERO, MD;  [facility];  Service: Endoscopy;  Laterality: N/A;   ESOPHAGOGASTRODUODENOSCOPY (EGD) WITH PROPOFOL  N/A 10/04/2023   Procedure: ESOPHAGOGASTRODUODENOSCOPY (EGD) WITH PROPOFOL ;  Surgeon: Shaaron Lamar HERO, MD;  [facility];  Service: Endoscopy;  Laterality: N/A;  200pm, ok rm 1-2, pt knows to arrive at 6:45   EUS N/A 12/31/2023   Procedure: ULTRASOUND, UPPER GI TRACT, ENDOSCOPIC;  Surgeon: Wilhelmenia Aloha Raddle., MD;  Location: [facility];  Service: Gastroenterology;  Laterality: N/A;   EXCISION OF BREAST BIOPSY Right 10/12/2020   Procedure: EXCISION OF RIGHT BREAST BIOPSY;  Surgeon: Mavis Anes, MD;  Location: [facility];  Service: General;  Laterality: Right;   IR DIALY SHUNT INTRO NEEDLE/INTRACATH INITIAL  W/IMG LEFT Left 07/17/2023   LAPAROSCOPIC APPENDECTOMY  09/29/2011   Procedure: APPENDECTOMY LAPAROSCOPIC;  Surgeon: Anes DELENA Mavis, MD;  Location: [facility];  Service: General;;  incidental appendectomy   LEFT HEART CATH AND CORONARY ANGIOGRAPHY N/A 02/28/2019   Procedure: LEFT HEART CATH AND CORONARY ANGIOGRAPHY;  Surgeon: Dann Candyce RAMAN, MD;  Location: [facility]AS[facility] CV LAB;  Service: Cardiovascular;  Laterality: N/A;   MASS EXCISION Right 01/18/2023   Procedure: EXCISION MASS, RIGHT CHEST S/P MASTECTOMY;  Surgeon: Mavis Anes, MD;  Location: [facility];  Service: General;  Laterality: Right;   MASTECTOMY MODIFIED RADICAL Right 02/18/2020   Procedure: MASTECTOMY MODIFIED RADICAL;  Surgeon: Mavis Anes, MD;  Location: [facility];  Service: General;  Laterality: Right;   MASTECTOMY, PARTIAL Right 07/13/2020   Procedure: RIGHT PARTIAL MASTECTOMY;  Surgeon: Mavis Anes, MD;  Location: [facility];  Service: General;  Laterality: Right;   PARTIAL MASTECTOMY WITH NEEDLE LOCALIZATION AND AXILLARY SENTINEL LYMPH NODE BX Right 09/18/2018   Procedure: RIGHT PARTIAL MASTECTOMY AFTER NEEDLE LOCALIZATION, SENTINEL LYMPH NODE BIOPSY RIGHT AXILLA;  Surgeon: Mavis Anes, MD;  Location: [facility];  Service: General;  Laterality: Right;   POLYPECTOMY  06/16/2021   Procedure: POLYPECTOMY;  Surgeon: Shaaron Lamar HERO, MD;  [facility];  Service: Endoscopy;;   PORT-A-CATH REMOVAL Right 11/29/2023   Procedure: REMOVAL PORT-A-CATH;  Surgeon: Mavis Anes, MD;  Location: [facility];  Service: General;  Laterality: Right;  MINOR PROCEDURE ROOM   PORTACATH PLACEMENT Right 06/07/2023   Procedure: INSERTION PORT-A-CATH, RIGHT  (DIALYSIS ACCESS ON LEFT);  Surgeon: Mavis Anes, MD;  Location: [facility];  Service: General;  Laterality: Right;   SIMPLE MASTECTOMY WITH AXILLARY SENTINEL NODE BIOPSY Left 06/15/2020   Procedure: LEFT SIMPLE MASTECTOMY;  Surgeon: Mavis Anes, MD;  Location: [facility];  Service: General;  Laterality:  Left;     Allergies  Allergen Reactions   Motrin [Ibuprofen] Other (See Comments)    ESRD       Family History  Problem Relation Age of Onset   Hypertension Mother    Coronary artery disease Mother    Diabetes Mother    Heart attack Father    Hypertension Sister    Coronary artery disease Sister  Hypertension Brother    Colon cancer Brother    Heart attack Maternal Grandmother    Heart attack Maternal Grandfather    Heart attack Paternal Grandmother    Heart attack Paternal Grandfather    Hypertension Son    Heart attack Maternal Aunt    Hypertension Maternal Aunt    Diabetes Maternal Aunt    Heart attack Maternal Uncle    Hypertension Maternal Uncle    Diabetes Maternal Uncle    Heart attack Paternal Aunt    Hypertension Paternal Aunt    Diabetes Paternal Aunt    Heart attack Paternal Uncle    Hypertension Paternal Uncle    Diabetes Paternal Uncle      Social History Ms. Mesmer reports that she has never smoked. She has never been exposed to tobacco smoke. She has never used smokeless tobacco. Ms. Castner reports no history of alcohol use.    Physical Examination Today's Vitals   03/28/24 1103  BP: 132/70  Pulse: 80  SpO2: 96%  Weight: 181 lb 12.8 oz (82.5 kg)  Height: 5' 1.5 (1.562 m)  PainSc: 8    Body mass index is 33.79 kg/m.  Gen: resting comfortably, no acute distress HEENT: no scleral icterus, pupils equal round and reactive, no palptable cervical adenopathy,  CV: RRR, no mrg, no jvd Resp: Clear to auscultation bilaterally GI: abdomen is soft, non-tender, non-distended, normal bowel sounds, no hepatosplenomegaly MSK: extremities are warm, no edema.  Skin: warm, no rash Neuro:  no focal deficits Psych: appropriate affect   Diagnostic Studies   NST: 08/2018 Nuclear stress EF: 59%. There was no ST segment deviation noted during stress. This is a low risk study. The left ventricular ejection fraction is normal  (55-65%).   04/2023 nuclear stress  Findings are consistent with no ischemia. The study is low risk.   LV perfusion is normal.  Breast/soft tissue attenuation noted with no reversible perfusion defects to indicate ischemia.   Left ventricular function is normal. Nuclear stress EF: 55%.   Assessment and Plan   1.Chest pain - several year history of chest pain - ischemic evaluations have been negative including stress testing in 2018 and 2020 and cath in 2020, nuclear stress neg 04/2023 - symptoms improved with imdur  - Suspect possibly vasospasm as etiology of symptoms given nitro responsiveness and lack of obstructive disease on prior testing.  - we will restart imdur  at 15mg  daily   2. HTN - at goal, continue current meds   3. Hyperlipidemia - repeat lipid panel  F/u 6 months     Dorn PHEBE Ross, M.D.

## 2024-03-31 DIAGNOSIS — N186 End stage renal disease: Secondary | ICD-10-CM | POA: Diagnosis not present

## 2024-03-31 DIAGNOSIS — Z79899 Other long term (current) drug therapy: Secondary | ICD-10-CM | POA: Diagnosis not present

## 2024-03-31 DIAGNOSIS — E782 Mixed hyperlipidemia: Secondary | ICD-10-CM | POA: Diagnosis not present

## 2024-03-31 DIAGNOSIS — Z992 Dependence on renal dialysis: Secondary | ICD-10-CM | POA: Diagnosis not present

## 2024-03-31 DIAGNOSIS — D631 Anemia in chronic kidney disease: Secondary | ICD-10-CM | POA: Diagnosis not present

## 2024-03-31 DIAGNOSIS — D509 Iron deficiency anemia, unspecified: Secondary | ICD-10-CM | POA: Diagnosis not present

## 2024-03-31 DIAGNOSIS — N2581 Secondary hyperparathyroidism of renal origin: Secondary | ICD-10-CM | POA: Diagnosis not present

## 2024-03-31 DIAGNOSIS — E1129 Type 2 diabetes mellitus with other diabetic kidney complication: Secondary | ICD-10-CM | POA: Diagnosis not present

## 2024-04-01 ENCOUNTER — Encounter (HOSPITAL_COMMUNITY)
Admission: RE | Admit: 2024-04-01 | Discharge: 2024-04-01 | Disposition: A | Discharge status: Home / Self Care | Source: Ambulatory Visit | Attending: General Surgery | Admitting: General Surgery

## 2024-04-01 ENCOUNTER — Other Ambulatory Visit: Payer: Self-pay

## 2024-04-01 ENCOUNTER — Encounter (HOSPITAL_COMMUNITY): Payer: Self-pay

## 2024-04-01 VITALS — Ht 61.5 in | Wt 181.8 lb

## 2024-04-01 DIAGNOSIS — N186 End stage renal disease: Secondary | ICD-10-CM

## 2024-04-01 LAB — LIPID PANEL
Cholesterol: 143 mg/dL (ref <200)
HDL: 44 mg/dL — ABNORMAL LOW (ref >=50)
LDL Cholesterol (Calc): 76 mg/dL
Non-HDL Cholesterol (Calc): 99 mg/dL (ref <130)
Total CHOL/HDL Ratio: 3.3 (calc) (ref <5.0)
Triglycerides: 155 mg/dL — ABNORMAL HIGH (ref <150)

## 2024-04-02 DIAGNOSIS — D509 Iron deficiency anemia, unspecified: Secondary | ICD-10-CM | POA: Diagnosis not present

## 2024-04-02 DIAGNOSIS — D631 Anemia in chronic kidney disease: Secondary | ICD-10-CM | POA: Diagnosis not present

## 2024-04-02 DIAGNOSIS — E039 Hypothyroidism, unspecified: Secondary | ICD-10-CM | POA: Diagnosis not present

## 2024-04-02 DIAGNOSIS — E119 Type 2 diabetes mellitus without complications: Secondary | ICD-10-CM | POA: Diagnosis not present

## 2024-04-02 DIAGNOSIS — Z992 Dependence on renal dialysis: Secondary | ICD-10-CM | POA: Diagnosis not present

## 2024-04-02 DIAGNOSIS — N2581 Secondary hyperparathyroidism of renal origin: Secondary | ICD-10-CM | POA: Diagnosis not present

## 2024-04-02 DIAGNOSIS — N186 End stage renal disease: Secondary | ICD-10-CM | POA: Diagnosis not present

## 2024-04-02 DIAGNOSIS — E1129 Type 2 diabetes mellitus with other diabetic kidney complication: Secondary | ICD-10-CM | POA: Diagnosis not present

## 2024-04-03 ENCOUNTER — Ambulatory Visit (HOSPITAL_COMMUNITY)

## 2024-04-03 ENCOUNTER — Encounter (HOSPITAL_COMMUNITY)
Admission: RE | Disposition: A | Discharge status: Home / Self Care | Payer: Self-pay | Source: Home / Self Care | Attending: General Surgery

## 2024-04-03 ENCOUNTER — Ambulatory Visit (HOSPITAL_COMMUNITY): Admitting: Anesthesiology

## 2024-04-03 ENCOUNTER — Ambulatory Visit (HOSPITAL_BASED_OUTPATIENT_CLINIC_OR_DEPARTMENT_OTHER): Admitting: Anesthesiology

## 2024-04-03 ENCOUNTER — Ambulatory Visit (HOSPITAL_COMMUNITY)
Admission: RE | Admit: 2024-04-03 | Discharge: 2024-04-03 | Disposition: A | Discharge status: Home / Self Care | Attending: General Surgery | Admitting: General Surgery

## 2024-04-03 ENCOUNTER — Encounter (HOSPITAL_COMMUNITY): Payer: Self-pay | Admitting: General Surgery

## 2024-04-03 ENCOUNTER — Other Ambulatory Visit: Payer: Self-pay

## 2024-04-03 DIAGNOSIS — I2510 Atherosclerotic heart disease of native coronary artery without angina pectoris: Secondary | ICD-10-CM | POA: Diagnosis not present

## 2024-04-03 DIAGNOSIS — Z1732 Human epidermal growth factor receptor 2 negative status: Secondary | ICD-10-CM | POA: Insufficient documentation

## 2024-04-03 DIAGNOSIS — E039 Hypothyroidism, unspecified: Secondary | ICD-10-CM | POA: Diagnosis not present

## 2024-04-03 DIAGNOSIS — I700 Atherosclerosis of aorta: Secondary | ICD-10-CM | POA: Diagnosis not present

## 2024-04-03 DIAGNOSIS — Z171 Estrogen receptor negative status [ER-]: Secondary | ICD-10-CM

## 2024-04-03 DIAGNOSIS — E1122 Type 2 diabetes mellitus with diabetic chronic kidney disease: Secondary | ICD-10-CM | POA: Diagnosis not present

## 2024-04-03 DIAGNOSIS — R918 Other nonspecific abnormal finding of lung field: Secondary | ICD-10-CM | POA: Diagnosis not present

## 2024-04-03 DIAGNOSIS — I132 Hypertensive heart and chronic kidney disease with heart failure and with stage 5 chronic kidney disease, or end stage renal disease: Secondary | ICD-10-CM | POA: Insufficient documentation

## 2024-04-03 DIAGNOSIS — K219 Gastro-esophageal reflux disease without esophagitis: Secondary | ICD-10-CM | POA: Insufficient documentation

## 2024-04-03 DIAGNOSIS — Z1722 Progesterone receptor negative status: Secondary | ICD-10-CM | POA: Diagnosis not present

## 2024-04-03 DIAGNOSIS — N186 End stage renal disease: Secondary | ICD-10-CM | POA: Diagnosis not present

## 2024-04-03 DIAGNOSIS — C799 Secondary malignant neoplasm of unspecified site: Secondary | ICD-10-CM

## 2024-04-03 DIAGNOSIS — C50911 Malignant neoplasm of unspecified site of right female breast: Secondary | ICD-10-CM | POA: Diagnosis present

## 2024-04-03 DIAGNOSIS — C50111 Malignant neoplasm of central portion of right female breast: Secondary | ICD-10-CM

## 2024-04-03 DIAGNOSIS — I5032 Chronic diastolic (congestive) heart failure: Secondary | ICD-10-CM | POA: Insufficient documentation

## 2024-04-03 DIAGNOSIS — Z452 Encounter for adjustment and management of vascular access device: Secondary | ICD-10-CM | POA: Diagnosis not present

## 2024-04-03 HISTORY — PX: PORTACATH PLACEMENT: SHX2246

## 2024-04-03 LAB — POCT I-STAT, CHEM 8
BUN: 32 mg/dL — ABNORMAL HIGH (ref 8–23)
Calcium, Ion: 1.08 mmol/L — ABNORMAL LOW (ref 1.15–1.40)
Chloride: 95 mmol/L — ABNORMAL LOW (ref 98–111)
Creatinine, Ser: 8.6 mg/dL — ABNORMAL HIGH (ref 0.44–1.00)
Glucose, Bld: 108 mg/dL — ABNORMAL HIGH (ref 70–99)
HCT: 35 % — ABNORMAL LOW (ref 36.0–46.0)
Hemoglobin: 11.9 g/dL — ABNORMAL LOW (ref 12.0–15.0)
Potassium: 4.1 mmol/L (ref 3.5–5.1)
Sodium: 137 mmol/L (ref 135–145)
TCO2: 31 mmol/L (ref 22–32)

## 2024-04-03 SURGERY — INSERTION, TUNNELED CENTRAL VENOUS DEVICE, WITH PORT
Anesthesia: General | Site: Chest | Laterality: Right

## 2024-04-03 MED ORDER — LIDOCAINE 2% (20 MG/ML) 5 ML SYRINGE
INTRAMUSCULAR | Status: DC | PRN
Start: 2024-04-03 — End: 2024-04-03
  Administered 2024-04-03: 60 mg via INTRAVENOUS

## 2024-04-03 MED ORDER — FENTANYL CITRATE (PF) 100 MCG/2ML IJ SOLN
INTRAMUSCULAR | Status: AC
  Filled 2024-04-03: qty 2

## 2024-04-03 MED ORDER — MIDAZOLAM HCL 2 MG/2ML IJ SOLN
INTRAMUSCULAR | Status: AC
  Filled 2024-04-03: qty 2

## 2024-04-03 MED ORDER — ONDANSETRON HCL 4 MG/2ML IJ SOLN
INTRAMUSCULAR | Status: AC
Start: 2024-04-03 — End: 2024-04-03
  Filled 2024-04-03: qty 2

## 2024-04-03 MED ORDER — MIDAZOLAM HCL 2 MG/2ML IJ SOLN
INTRAMUSCULAR | Status: DC | PRN
  Administered 2024-04-03 (×2): 1 mg via INTRAVENOUS

## 2024-04-03 MED ORDER — CEFAZOLIN SODIUM-DEXTROSE 2-4 GM/100ML-% IV SOLN
2.0000 g | INTRAVENOUS | Status: AC
Start: 2024-04-03 — End: 2024-04-03
  Administered 2024-04-03: 2 g via INTRAVENOUS
  Filled 2024-04-03: qty 100

## 2024-04-03 MED ORDER — ORAL CARE MOUTH RINSE: 15.0000 mL | Freq: Once | OROMUCOSAL | Status: DC

## 2024-04-03 MED ORDER — ROCURONIUM BROMIDE 10 MG/ML (PF) SYRINGE
PREFILLED_SYRINGE | INTRAVENOUS | Status: AC
Start: 2024-04-03 — End: 2024-04-03
  Filled 2024-04-03: qty 10

## 2024-04-03 MED ORDER — OXYCODONE HCL 5 MG PO TABS: 5.0000 mg | ORAL_TABLET | Freq: Once | ORAL | Status: DC | PRN

## 2024-04-03 MED ORDER — HEPARIN SOD (PORK) LOCK FLUSH 100 UNIT/ML IV SOLN
INTRAVENOUS | Status: DC | PRN
Start: 2024-04-03 — End: 2024-04-03
  Administered 2024-04-03: 500 [IU] via INTRAVENOUS

## 2024-04-03 MED ORDER — HEPARIN SOD (PORK) LOCK FLUSH 100 UNIT/ML IV SOLN
INTRAVENOUS | Status: AC
  Filled 2024-04-03: qty 5

## 2024-04-03 MED ORDER — ONDANSETRON HCL 4 MG/2ML IJ SOLN
INTRAMUSCULAR | Status: DC | PRN
  Administered 2024-04-03: 4 mg via INTRAVENOUS

## 2024-04-03 MED ORDER — LACTATED RINGERS IV SOLN: INTRAVENOUS | Status: DC

## 2024-04-03 MED ORDER — FENTANYL CITRATE PF 50 MCG/ML IJ SOSY: 25.0000 ug | PREFILLED_SYRINGE | INTRAMUSCULAR | Status: DC | PRN

## 2024-04-03 MED ORDER — LIDOCAINE HCL (PF) 1 % IJ SOLN
INTRAMUSCULAR | Status: DC | PRN
Start: 2024-04-03 — End: 2024-04-03
  Administered 2024-04-03: 9 mL

## 2024-04-03 MED ORDER — PROPOFOL 500 MG/50ML IV EMUL
INTRAVENOUS | Status: DC | PRN
  Administered 2024-04-03: 80 ug/kg/min via INTRAVENOUS

## 2024-04-03 MED ORDER — CHLORHEXIDINE GLUCONATE 0.12 % MT SOLN
15.0000 mL | Freq: Once | OROMUCOSAL | Status: AC
  Administered 2024-04-03: 15 mL via OROMUCOSAL

## 2024-04-03 MED ORDER — CHLORHEXIDINE GLUCONATE CLOTH 2 % EX PADS: 6.0000 | MEDICATED_PAD | Freq: Once | CUTANEOUS | Status: DC

## 2024-04-03 MED ORDER — ONDANSETRON HCL 4 MG/2ML IJ SOLN: 4.0000 mg | Freq: Once | INTRAMUSCULAR | Status: DC | PRN

## 2024-04-03 MED ORDER — LIDOCAINE 2% (20 MG/ML) 5 ML SYRINGE
INTRAMUSCULAR | Status: AC
  Filled 2024-04-03: qty 10

## 2024-04-03 MED ORDER — OXYCODONE HCL 5 MG/5ML PO SOLN: 5.0000 mg | Freq: Once | ORAL | Status: DC | PRN

## 2024-04-03 MED ORDER — LIDOCAINE HCL (PF) 1 % IJ SOLN
INTRAMUSCULAR | Status: AC
  Filled 2024-04-03: qty 30

## 2024-04-03 MED ORDER — SODIUM CHLORIDE 0.9 % IV SOLN: INTRAVENOUS | Status: DC | PRN

## 2024-04-03 MED ORDER — CHLORHEXIDINE GLUCONATE 0.12 % MT SOLN: 15.0000 mL | Freq: Once | OROMUCOSAL | Status: DC

## 2024-04-03 MED ORDER — SODIUM CHLORIDE (PF) 0.9 % IJ SOLN
INTRAMUSCULAR | Status: DC | PRN
  Administered 2024-04-03: 500 mL

## 2024-04-03 MED ORDER — FENTANYL CITRATE (PF) 100 MCG/2ML IJ SOLN
INTRAMUSCULAR | Status: DC | PRN
  Administered 2024-04-03: 25 ug via INTRAVENOUS

## 2024-04-03 MED ORDER — LIDOCAINE 2% (20 MG/ML) 5 ML SYRINGE
INTRAMUSCULAR | Status: AC
Start: 2024-04-03 — End: 2024-04-03
  Filled 2024-04-03: qty 5

## 2024-04-03 SURGICAL SUPPLY — 22 items
APPLICATOR CHLORAPREP 10.5 ORG (MISCELLANEOUS) ×1 IMPLANT
BAG DECANTER FOR FLEXI CONT (MISCELLANEOUS) ×1 IMPLANT
CLOTH BEACON ORANGE TIMEOUT ST (SAFETY) ×1 IMPLANT
COVER LIGHT HANDLE (MISCELLANEOUS) IMPLANT
DERMABOND ADVANCED .7 DNX12 (GAUZE/BANDAGES/DRESSINGS) ×1 IMPLANT
DRAPE C-ARM FOLDED MOBILE STRL (DRAPES) ×1 IMPLANT
ELECTRODE REM PT RTRN 9FT ADLT (ELECTROSURGICAL) ×1 IMPLANT
GLOVE BIOGEL PI IND STRL 7.0 (GLOVE) ×2 IMPLANT
GLOVE SURG SS PI 7.5 STRL IVOR (GLOVE) ×2 IMPLANT
GOWN STRL REUS W/TWL LRG LVL3 (GOWN DISPOSABLE) ×2 IMPLANT
IV NS 500ML BAXH (IV SOLUTION) ×1 IMPLANT
KIT PORT INFUSION SMART 8FR (Port) IMPLANT
KIT TURNOVER KIT A (KITS) ×1 IMPLANT
NDL HYPO 25X1 1.5 SAFETY (NEEDLE) ×1 IMPLANT
NEEDLE HYPO 25X1 1.5 SAFETY (NEEDLE) ×1 IMPLANT
PACK MINOR (CUSTOM PROCEDURE TRAY) ×1 IMPLANT
PAD ARMBOARD POSITIONER FOAM (MISCELLANEOUS) ×1 IMPLANT
SET BASIN LINEN APH (SET/KITS/TRAYS/PACK) ×1 IMPLANT
SUT MNCRL AB 4-0 PS2 18 (SUTURE) ×1 IMPLANT
SUT VIC AB 3-0 SH 27X BRD (SUTURE) ×1 IMPLANT
SYR 5ML LL (SYRINGE) ×1 IMPLANT
SYR CONTROL 10ML LL (SYRINGE) ×1 IMPLANT

## 2024-04-03 NOTE — Transfer of Care (Signed)
 Immediate Anesthesia Transfer of Care Note  Patient: Megan [name]  Procedure(s) Performed: INSERTION, TUNNELED CENTRAL VENOUS DEVICE, WITH PORT (Right: Chest)  Patient Location: PACU  Anesthesia Type:MAC  Level of Consciousness: awake, alert , oriented, and patient cooperative  Airway & Oxygen  Therapy: Patient Spontanous Breathing  Post-op Assessment: Report given to RN, Post -op Vital signs reviewed and stable, and Patient moving all extremities X 4  Post vital signs: Reviewed and stable  Last Vitals:  Vitals Value Taken Time  BP 146/83 04/03/24 08:45  Temp 36.6 C 04/03/24 08:15  Pulse 76 04/03/24 08:59  Resp 14 04/03/24 08:59  SpO2 89 % 04/03/24 08:59  Vitals shown include unfiled device data.  Last Pain:  Vitals:   04/03/24 0845  PainSc: 0-No pain         Complications: No notable events documented.

## 2024-04-03 NOTE — Anesthesia Preprocedure Evaluation (Signed)
 Anesthesia Evaluation  Patient identified by MRN, date of birth, ID band Patient awake    Reviewed: Allergy & Precautions, H&P , NPO status , Patient's Chart, lab work & pertinent test results, reviewed documented beta blocker date and time   History of Anesthesia Complications (+) PONV and history of anesthetic complications  Airway Mallampati: II  TM Distance: >3 FB Neck ROM: full    Dental no notable dental hx.    Pulmonary neg pulmonary ROS, shortness of breath, pneumonia   Pulmonary exam normal breath sounds clear to auscultation       Cardiovascular Exercise Tolerance: Good hypertension, + CAD and +CHF  negative cardio ROS  Rhythm:regular Rate:Normal     Neuro/Psych  Headaches negative neurological ROS  negative psych ROS   GI/Hepatic negative GI ROS, Neg liver ROS,GERD  ,,  Endo/Other  negative endocrine ROSdiabetesHypothyroidism    Renal/GU Renal diseasenegative Renal ROS  negative genitourinary   Musculoskeletal   Abdominal   Peds  Hematology negative hematology ROS (+) Blood dyscrasia, anemia   Anesthesia Other Findings   Reproductive/Obstetrics negative OB ROS                              Anesthesia Physical Anesthesia Plan  ASA: 3  Anesthesia Plan: General   Post-op Pain Management:    Induction:   PONV Risk Score and Plan: Propofol  infusion  Airway Management Planned:   Additional Equipment:   Intra-op Plan:   Post-operative Plan:   Informed Consent: I have reviewed the patients History and Physical, chart, labs and discussed the procedure including the risks, benefits and alternatives for the proposed anesthesia with the patient or authorized representative who has indicated his/her understanding and acceptance.     Dental Advisory Given  Plan Discussed with: CRNA  Anesthesia Plan Comments:         Anesthesia Quick Evaluation

## 2024-04-03 NOTE — Progress Notes (Signed)
 Incision closed with sutures and skin glue. No drainage, ice placed for patient.

## 2024-04-03 NOTE — Op Note (Signed)
 Patient:  Megan [name]  DOB:  1961-03-25  MRN:  [id number]   Preop Diagnosis: Recurrent right breast cancer, need for central venous access  Postop Diagnosis: Same  Procedure: Port-A-Cath insertion  Surgeon: Oneil Budge, MD  Anes: MAC  Indications: Patient is a 63 year old black female who presents for Port-A-Cath insertion.  She has recurrent right breast cancer of the chest wall and is about to undergo chemotherapy.  The risks and benefits of the procedure including bleeding, infection, and pneumothorax were fully explained to the patient, who gave informed consent.  Procedure note: The patient was placed in the Trendelenburg position after the right upper chest was prepped and draped using the usual sterile technique with ChloraPrep.  Surgical site confirmation was performed.  1% Xylocaine  was used for local anesthesia.  The previous Port-A-Cath scar was excised.  A subcutaneous pocket was formed under the right clavicle.  The needle was advanced into the right subclavian vein using the Seldinger technique without difficulty.  A guidewire was then advanced into the right atrium under fluoroscopic guidance.  An introducer and peel-away sheath were placed over the guidewire.  The catheter was inserted through the peel-away sheath and the peel-away sheath was removed.  The catheter was then attached to the port and the port placed into the subcutaneous pocket.  Adequate positioning was confirmed by fluoroscopy.  Good backflow of venous blood was noted on aspiration of the port.  The port was flushed with heparin  flush.  The subcutaneous layer was reapproximated using a 3-0 Vicryl interrupted suture.  The skin was closed using a 4-0 Monocryl subcuticular suture.  Dermabond was applied.  All tape and needle counts were correct at the end of the procedure.  The patient was awakened and transferred to PACU in stable condition.  A chest x-ray will be performed at that time.  Complications:  None  EBL: Minimal  Specimen: None

## 2024-04-03 NOTE — Interval H&P Note (Signed)
 History and Physical Interval Note:  04/03/2024 7:02 AM  Megan [name]  has presented today for surgery, with the diagnosis of MALIGNANT NEOPLASM RIGHT BREAST, CENTRAL PORTION ER/PR/HER 2 NEGATIVE METASTATIC ADENOCARCINOMA.  The various methods of treatment have been discussed with the patient and family. After consideration of risks, benefits and other options for treatment, the patient has consented to  Procedure(s): INSERTION, TUNNELED CENTRAL VENOUS DEVICE, WITH PORT (Right) as a surgical intervention.  The patient's history has been reviewed, patient examined, no change in status, stable for surgery.  I have reviewed the patient's chart and labs.  Questions were answered to the patient's satisfaction.     Oneil Budge

## 2024-04-04 ENCOUNTER — Encounter (HOSPITAL_COMMUNITY): Payer: Self-pay | Admitting: General Surgery

## 2024-04-04 ENCOUNTER — Other Ambulatory Visit: Payer: Self-pay

## 2024-04-04 DIAGNOSIS — E1129 Type 2 diabetes mellitus with other diabetic kidney complication: Secondary | ICD-10-CM | POA: Diagnosis not present

## 2024-04-04 DIAGNOSIS — Z992 Dependence on renal dialysis: Secondary | ICD-10-CM | POA: Diagnosis not present

## 2024-04-04 DIAGNOSIS — N186 End stage renal disease: Secondary | ICD-10-CM | POA: Diagnosis not present

## 2024-04-04 DIAGNOSIS — D631 Anemia in chronic kidney disease: Secondary | ICD-10-CM | POA: Diagnosis not present

## 2024-04-04 DIAGNOSIS — D509 Iron deficiency anemia, unspecified: Secondary | ICD-10-CM | POA: Diagnosis not present

## 2024-04-04 DIAGNOSIS — N2581 Secondary hyperparathyroidism of renal origin: Secondary | ICD-10-CM | POA: Diagnosis not present

## 2024-04-07 DIAGNOSIS — Z992 Dependence on renal dialysis: Secondary | ICD-10-CM | POA: Diagnosis not present

## 2024-04-07 DIAGNOSIS — D631 Anemia in chronic kidney disease: Secondary | ICD-10-CM | POA: Diagnosis not present

## 2024-04-07 DIAGNOSIS — N2581 Secondary hyperparathyroidism of renal origin: Secondary | ICD-10-CM | POA: Diagnosis not present

## 2024-04-07 DIAGNOSIS — C50911 Malignant neoplasm of unspecified site of right female breast: Secondary | ICD-10-CM | POA: Diagnosis not present

## 2024-04-07 DIAGNOSIS — E1129 Type 2 diabetes mellitus with other diabetic kidney complication: Secondary | ICD-10-CM | POA: Diagnosis not present

## 2024-04-07 DIAGNOSIS — D509 Iron deficiency anemia, unspecified: Secondary | ICD-10-CM | POA: Diagnosis not present

## 2024-04-07 DIAGNOSIS — N186 End stage renal disease: Secondary | ICD-10-CM | POA: Diagnosis not present

## 2024-04-07 DIAGNOSIS — C773 Secondary and unspecified malignant neoplasm of axilla and upper limb lymph nodes: Secondary | ICD-10-CM | POA: Diagnosis not present

## 2024-04-07 NOTE — Progress Notes (Unsigned)
 Patient Care Team: Duanne Butler DASEN, MD as PCP - General (Family Medicine) Alvan Dorn FALCON, MD as PCP - Cardiology (Cardiology) Shaaron Lamar HERO, MD (Gastroenterology) Debera Jayson MATSU, MD as Consulting Physician (Cardiology) Rayburn Pac, MD as Consulting Physician (Nephrology)  Clinic Day:  04/07/2024  Referring physician: Duanne Butler DASEN, MD   CHIEF COMPLAINT:  CC: ***  Megan [name] 63 y.o. female was transferred to my care after her prior physician has left.   ASSESSMENT & PLAN:   Assessment & Plan: Megan [name]  is a 63 y.o. female with ***  Assessment & Plan     The patient understands the plans discussed today and is in agreement with them.  She knows to contact our office if she develops concerns prior to her next appointment.  *** minutes of total time was spent for this patient encounter, including preparation, face-to-face counseling with the patient and coordination of care, physical exam, and documentation of the encounter. > 50% of the time was spent on counseling as documented under my assessment and plan.    Mickiel Dry, MD  [facility] [place] - A DEPT OF [name] [address] [place] [address] Dept: [phone] Dept Fax: [phone]   No orders of the defined types were placed in this encounter.    ONCOLOGY HISTORY:   I have reviewed her chart and materials related to her cancer extensively and collaborated history with the patient. Summary of oncologic history is as follows:   ***  Current Treatment:  ***  INTERVAL HISTORY:   Megan [name] is here today for follow up. Patient is accompanied by *** .     I have reviewed the past medical history, past surgical history, social history and family history with the patient and they are unchanged from previous note.  ALLERGIES:  is allergic to motrin [ibuprofen].  MEDICATIONS:  Current  Outpatient Medications  Medication Sig Dispense Refill   acetaminophen  (TYLENOL ) 500 MG tablet Take 1,000 mg by mouth every 8 (eight) hours as needed for moderate pain (pain score 4-6).     amLODipine  (NORVASC ) 5 MG tablet Take 5 mg by mouth at bedtime.     aspirin  EC 81 MG tablet Take 81 mg by mouth daily.     atorvastatin  (LIPITOR) 20 MG tablet TAKE 1 TABLET(20 MG) BY MOUTH DAILY 90 tablet 0   B Complex-C-Zn-Folic Acid  (DIALYVITE  800-ZINC  15) 0.8 MG TABS Take 1 tablet by mouth daily.     cinacalcet  (SENSIPAR ) 90 MG tablet Take 90 mg by mouth daily.     Darbepoetin Alfa  (ARANESP , ALBUMIN FREE, IJ) Darbepoetin Alfa  (Aranesp )     fluticasone  (FLONASE ) 50 MCG/ACT nasal spray Place 1 spray into both nostrils daily.     gabapentin  (NEURONTIN ) 300 MG capsule TAKE 1 CAPSULE(300 MG) BY MOUTH AT BEDTIME 90 capsule 3   iron  sucrose (VENOFER ) 20 MG/ML injection Inject 50 mg into the vein once a week.     isosorbide  mononitrate (IMDUR ) 30 MG 24 hr tablet Take 0.5 tablets (15 mg total) by mouth daily. 15 tablet 6   lanthanum  (FOSRENOL ) 1000 MG chewable tablet Chew 2,000-3,000 mg by mouth See admin instructions. Take 3 tablets (3000 mg) by mouth with meals and take 2 tablets (2000 mg) with snacks     lidocaine -prilocaine  (EMLA ) cream APPLY TOPICALLY TO THE AFFECTED AREA 1 TIME 30 g 3   meclizine  (ANTIVERT ) 25 MG tablet Take 1 tablet (  25 mg total) by mouth 3 (three) times daily as needed for dizziness. 30 tablet 1   nebivolol  (BYSTOLIC ) 10 MG tablet TAKE 1 TABLET BY MOUTH IN THE MORNING AND AT BEDTIME 180 tablet 0   nitroGLYCERIN  (NITROSTAT ) 0.4 MG SL tablet Place 1 tablet (0.4 mg total) under the tongue every 5 (five) minutes x 3 doses as needed for chest pain (if no relief after 3rd dose, proceed to ED or call 911). 25 tablet 3   ondansetron  (ZOFRAN ) 4 MG tablet Take 1 tablet (4 mg total) by mouth every 6 (six) hours. (Patient taking differently: Take 4 mg by mouth every 8 (eight) hours as needed for nausea or  vomiting.) 12 tablet 0   oxyCODONE  (ROXICODONE ) 5 MG immediate release tablet Take 1 tablet (5 mg total) by mouth every 12 (twelve) hours as needed. 60 tablet 0   pantoprazole  (PROTONIX ) 40 MG tablet Take 1 tablet (40 mg total) by mouth 2 (two) times daily before a meal. 60 tablet 11   No current facility-administered medications for this visit.    REVIEW OF SYSTEMS:   Constitutional: Denies fevers, chills or abnormal weight loss Eyes: Denies blurriness of vision Ears, nose, mouth, throat, and face: Denies mucositis or sore throat Respiratory: Denies cough, dyspnea or wheezes Cardiovascular: Denies palpitation, chest discomfort or lower extremity swelling Gastrointestinal:  Denies nausea, heartburn or change in bowel habits Skin: Denies abnormal skin rashes Lymphatics: Denies new lymphadenopathy or easy bruising Neurological:Denies numbness, tingling or new weaknesses Behavioral/Psych: Mood is stable, no new changes  All other systems were reviewed with the patient and are negative.   VITALS:  There were no vitals taken for this visit.  Wt Readings from Last 3 Encounters:  04/03/24 181 lb 14.1 oz (82.5 kg)  04/01/24 181 lb 12.8 oz (82.5 kg)  03/28/24 181 lb 12.8 oz (82.5 kg)    There is no height or weight on file to calculate BMI.  Performance status (ECOG): {CHL ONC [id number]  PHYSICAL EXAM:   GENERAL:alert, no distress and comfortable SKIN: skin color, texture, turgor are normal, no rashes or significant lesions EYES: normal, Conjunctiva are pink and non-injected, sclera clear OROPHARYNX:no exudate, no erythema and lips, buccal mucosa, and tongue normal  NECK: supple, thyroid  normal size, non-tender, without nodularity LYMPH:  no palpable lymphadenopathy in the cervical, axillary or inguinal LUNGS: clear to auscultation and percussion with normal breathing effort HEART: regular rate & rhythm and no murmurs and no lower extremity edema ABDOMEN:abdomen soft,  non-tender and normal bowel sounds Musculoskeletal:no cyanosis of digits and no clubbing  NEURO: alert & oriented x 3 with fluent speech, no focal motor/sensory deficits  LABORATORY DATA:  I have reviewed the data as listed  No results found for: SPEP, UPEP  Lab Results  Component Value Date   WBC 7.5 03/03/2024   NEUTROABS 5.2 03/03/2024   HGB 11.9 (L) 04/03/2024   HCT 35.0 (L) 04/03/2024   MCV 113.6 (H) 03/03/2024   PLT 216 03/03/2024      Chemistry      Component Value Date/Time   NA 137 04/03/2024 0720   K 4.1 04/03/2024 0720   CL 95 (L) 04/03/2024 0720   CO2 32 03/03/2024 1111   BUN 32 (H) 04/03/2024 0720   CREATININE 8.60 (H) 04/03/2024 0720   CREATININE 9.08 (H) 01/10/2024 1113      Component Value Date/Time   CALCIUM  8.6 (L) 03/03/2024 1111   ALKPHOS 120 03/03/2024 1111   AST 17 03/03/2024 1111  ALT 16 03/03/2024 1111   BILITOT 0.4 03/03/2024 1111        RADIOGRAPHIC STUDIES: I have personally reviewed the radiological images as listed and agreed with the findings in the report.  DG Chest Port 1 View CLINICAL DATA:  Port-A-Cath placement  EXAM: PORTABLE CHEST 1 VIEW  COMPARISON:  01/16/2024  FINDINGS: Power port placed from a right subclavian approach with the tip in the SVC 4 cm above the right atrium. No pneumothorax. Heart size is normal. Aortic atherosclerosis is present. Multiple pulmonary nodules are present consistent with metastatic disease, apparently progressive since June of this year.  IMPRESSION: 1. PowerPort placed from a right subclavian approach with the tip in the SVC 4 cm above the right atrium. No pneumothorax. 2. Multiple pulmonary nodules consistent with metastatic disease. Findings are progressive since June.  Electronically Signed   By: Oneil Officer M.D.   On: 04/03/2024 10:04 DG C-Arm 1-60 Min-No Report Fluoroscopy was utilized by the requesting physician.  No radiographic  interpretation.

## 2024-04-08 ENCOUNTER — Inpatient Hospital Stay

## 2024-04-08 ENCOUNTER — Other Ambulatory Visit (HOSPITAL_COMMUNITY): Payer: Self-pay

## 2024-04-08 ENCOUNTER — Telehealth: Payer: Self-pay | Admitting: Pharmacy Technician

## 2024-04-08 ENCOUNTER — Telehealth: Payer: Self-pay | Admitting: Pharmacist

## 2024-04-08 ENCOUNTER — Inpatient Hospital Stay (HOSPITAL_BASED_OUTPATIENT_CLINIC_OR_DEPARTMENT_OTHER): Admitting: Oncology

## 2024-04-08 VITALS — BP 111/62 | HR 94 | Temp 98.2°F | Resp 18 | Wt 180.8 lb

## 2024-04-08 DIAGNOSIS — R0789 Other chest pain: Secondary | ICD-10-CM | POA: Diagnosis not present

## 2024-04-08 DIAGNOSIS — M8580 Other specified disorders of bone density and structure, unspecified site: Secondary | ICD-10-CM | POA: Insufficient documentation

## 2024-04-08 DIAGNOSIS — G6289 Other specified polyneuropathies: Secondary | ICD-10-CM | POA: Diagnosis not present

## 2024-04-08 DIAGNOSIS — N186 End stage renal disease: Secondary | ICD-10-CM | POA: Diagnosis not present

## 2024-04-08 DIAGNOSIS — C50211 Malignant neoplasm of upper-inner quadrant of right female breast: Secondary | ICD-10-CM

## 2024-04-08 DIAGNOSIS — G629 Polyneuropathy, unspecified: Secondary | ICD-10-CM | POA: Insufficient documentation

## 2024-04-08 DIAGNOSIS — Z171 Estrogen receptor negative status [ER-]: Secondary | ICD-10-CM | POA: Diagnosis not present

## 2024-04-08 DIAGNOSIS — Z79811 Long term (current) use of aromatase inhibitors: Secondary | ICD-10-CM | POA: Diagnosis not present

## 2024-04-08 DIAGNOSIS — Z1722 Progesterone receptor negative status: Secondary | ICD-10-CM | POA: Diagnosis not present

## 2024-04-08 DIAGNOSIS — Z1732 Human epidermal growth factor receptor 2 negative status: Secondary | ICD-10-CM | POA: Diagnosis not present

## 2024-04-08 DIAGNOSIS — C50911 Malignant neoplasm of unspecified site of right female breast: Secondary | ICD-10-CM

## 2024-04-08 DIAGNOSIS — Z170 Estrogen receptor positive status [ER+]: Secondary | ICD-10-CM | POA: Diagnosis not present

## 2024-04-08 DIAGNOSIS — R918 Other nonspecific abnormal finding of lung field: Secondary | ICD-10-CM | POA: Diagnosis not present

## 2024-04-08 LAB — CBC WITH DIFFERENTIAL/PLATELET
Abs Granulocyte: 6.5 K/uL (ref 1.5–6.5)
Abs Immature Granulocytes: 0.03 K/uL (ref 0.00–0.07)
Basophils Absolute: 0.1 K/uL (ref 0.0–0.1)
Basophils Relative: 1 %
Eosinophils Absolute: 0.3 K/uL (ref 0.0–0.5)
Eosinophils Relative: 4 %
HCT: 33.8 % — ABNORMAL LOW (ref 36.0–46.0)
Hemoglobin: 10.5 g/dL — ABNORMAL LOW (ref 12.0–15.0)
Immature Granulocytes: 0 %
Lymphocytes Relative: 13 %
Lymphs Abs: 1.2 K/uL (ref 0.7–4.0)
MCH: 34.8 pg — ABNORMAL HIGH (ref 26.0–34.0)
MCHC: 31.1 g/dL (ref 30.0–36.0)
MCV: 111.9 fL — ABNORMAL HIGH (ref 80.0–100.0)
Monocytes Absolute: 1.1 K/uL — ABNORMAL HIGH (ref 0.1–1.0)
Monocytes Relative: 12 %
Neutro Abs: 6.5 K/uL (ref 1.7–7.7)
Neutrophils Relative %: 70 %
Platelets: 257 K/uL (ref 150–400)
RBC: 3.02 MIL/uL — ABNORMAL LOW (ref 3.87–5.11)
RDW: 15.9 % — ABNORMAL HIGH (ref 11.5–15.5)
Smear Review: NORMAL
WBC: 9.2 K/uL (ref 4.0–10.5)
nRBC: 0 % (ref 0.0–0.2)

## 2024-04-08 LAB — FOLATE: Folate: 37.2 ng/mL (ref >5.9)

## 2024-04-08 LAB — COMPREHENSIVE METABOLIC PANEL WITH GFR
ALT: 7 U/L (ref 0–44)
AST: 21 U/L (ref 15–41)
Albumin: 3.4 g/dL — ABNORMAL LOW (ref 3.5–5.0)
Alkaline Phosphatase: 88 U/L (ref 38–126)
Anion gap: 20 — ABNORMAL HIGH (ref 5–15)
BUN: 36 mg/dL — ABNORMAL HIGH (ref 8–23)
CO2: 27 mmol/L (ref 22–32)
Calcium: 9.1 mg/dL (ref 8.9–10.3)
Chloride: 90 mmol/L — ABNORMAL LOW (ref 98–111)
Creatinine, Ser: 10.99 mg/dL — ABNORMAL HIGH (ref 0.44–1.00)
GFR, Estimated: 4 mL/min — ABNORMAL LOW (ref >60)
Glucose, Bld: 114 mg/dL — ABNORMAL HIGH (ref 70–99)
Potassium: 3.5 mmol/L (ref 3.5–5.1)
Sodium: 137 mmol/L (ref 135–145)
Total Bilirubin: 0.7 mg/dL (ref 0.0–1.2)
Total Protein: 7.8 g/dL (ref 6.5–8.1)

## 2024-04-08 LAB — VITAMIN B12: Vitamin B-12: 451 pg/mL (ref 180–914)

## 2024-04-08 LAB — IRON AND TIBC
Iron: 40 ug/dL (ref 28–170)
Saturation Ratios: 20 % (ref 10.4–31.8)
TIBC: 204 ug/dL — ABNORMAL LOW (ref 250–450)
UIBC: 164 ug/dL

## 2024-04-08 LAB — FERRITIN: Ferritin: 924 ng/mL — ABNORMAL HIGH (ref 11–307)

## 2024-04-08 MED ORDER — CAPECITABINE 500 MG PO TABS: 500.0000 mg | ORAL_TABLET | Freq: Two times a day (BID) | ORAL | 4 refills | Status: DC

## 2024-04-08 MED ORDER — ONDANSETRON 4 MG PO TBDP: 4.0000 mg | ORAL_TABLET | Freq: Three times a day (TID) | ORAL | 2 refills | Status: DC | PRN

## 2024-04-08 NOTE — Assessment & Plan Note (Signed)
 She has tingling in the fingertips, right more than left. Occasional pain in the feet present.  Stable at this time  -Continue gabapentin  300 mg at bedtime.

## 2024-04-08 NOTE — Telephone Encounter (Signed)
 Oral Oncology Patient Advocate Encounter  After completing a benefits investigation, prior authorization for Capecitabine  is not required at this time through [employer].  Patient's copay is $9.02.     Harsh Trulock ([name]) Chet Burnet, CPhT  [facility], [place], [place] Oral Chemotherapy Patient Advocate Specialist III Phone: [phone]  Fax: [phone]

## 2024-04-08 NOTE — Assessment & Plan Note (Signed)
-  Continue oxycodone  5 mg 1-2 times daily as needed.

## 2024-04-08 NOTE — Progress Notes (Signed)
 Patient Care Team: Duanne Butler DASEN, MD as PCP - General (Family Medicine) Alvan, Dorn FALCON, MD as PCP - Cardiology (Cardiology) Shaaron Lamar HERO, MD (Gastroenterology) Debera Jayson MATSU, MD as Consulting Physician (Cardiology) Rayburn Pac, MD as Consulting Physician (Nephrology)  Clinic Day:  04/08/2024  Referring physician: Duanne Butler DASEN, MD   CHIEF COMPLAINT:  CC: Stage Ib (T1CN0) right breast cancer, ER positive, PR and HER-2 negative   Megan [name] 63 y.o. female was transferred to my care after her prior physician has left.   ASSESSMENT & PLAN:   Assessment & Plan: [name]  is a 63 y.o. female with stage Ib (T1CN0) right breast cancer, ER positive, PR and HER-2 negative  Assessment & Plan Recurrent breast cancer, right (HCC) Stage Ib(T1c N0) ER positive, PR and HER2 negative right breast cancer.  Diagnosed in 2019. S/p right breast lumpectomy, Oncotype DX score of 47.  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  Also had prophylactic left simple mastectomy. First recurrence: 07/13/2020.  Local recurrence, s/p right breast lumpectomy. Second occurrence: 10/12/2020.  Local recurrence.  S/p right mastectomy and radiation therapy.Patient decided against chemotherapy considering poor tolerance with the first time and bad peripheral neuropathy. Third recurrence: 11/30/2022: Within the right medial pectoralis musculature-local recurrence.  S/p right chest wall mass resection consistent with invasive ductal carcinoma.  Triple negative. S/p RT.  6 cycles of carboplatin /docetaxel  and pembrolizumab . PET scan in 01/2024 showed abnormal lymph nodes in the left axillary region.  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 Triple negative.  Germline mutation analysis: Negative.  [facility] NGS: PD-L1: 10, no other targetable mutations  - Patient was evaluated by Dr. [name] at [facility] for  second opinion.  She recommended doing a dose reduced capecitabine  considering poor renal function starting at 500 mg twice daily along with pembrolizumab  as patient has PD-L1 positivity.  Recommended 4-6 cycles, assessment after that and probably 1 year of pembrolizumab .  Treating with a curative intent.  If progressive on this regimen can consider sacituzumab govitecan + pembrolizumab   - I believe this is a reasonable choice considering this is a local recurrence and patient has peripheral neuropathy and cannot get chemotherapy.  Will order capecitabine  at a low dose. -Can consider lymph node excision if patient has good response - We discussed side effects of capecitabine : Diarrhea, nausea, vomiting, loss of appetite, hand-foot syndrome.  Also discussed side effects of pembrolizumab : Rash, thyroid  irregularities, colitis, pneumonitis and hepatitis. - Recommended patient to use diclofenac gel and urea  cream for hand and foot - Will repeat PET scan after 3 cycles of pembrolizumab  -Can consider sacituzumab govitecan + pembrolizumab  if there is no response  Return to clinic in 1 month with labs prior to second cycle of pembrolizumab  Other polyneuropathy She has tingling in the fingertips, right more than left. Occasional pain in the feet present.  Stable at this time  -Continue gabapentin  300 mg at bedtime.  Right-sided chest wall pain -Continue oxycodone  5 mg 1-2 times daily as needed.  Osteopenia, unspecified location -Bone density on 01/20/2019 shows T score -1.9. -DEXA scan (03/24/2021) T score -2.4  ESRD (end stage renal disease) [facility]) Patient with ESRD on hemodialysis Monday Wednesday Friday  - Continue follow-up with nephrologist    The patient understands the plans discussed today and is in agreement with them.  She knows to contact our office if she develops concerns prior to her next appointment.  120 minutes  of total time was spent for this patient encounter, including preparation,  face-to-face counseling with the patient and coordination of care, physical exam, and documentation of the encounter. > 50% of the time was spent on counseling as documented under my assessment and plan.    [name],acting as a Neurosurgeon for Mickiel Dry, MD.,have documented all relevant documentation on the behalf of Mickiel Dry, MD,as directed by  Mickiel Dry, MD while in the presence of Mickiel Dry, MD.  I, Mickiel Dry MD, have reviewed the above documentation for accuracy and completeness, and I agree with the above.     Mickiel Dry, MD  [facility] [place] - A DEPT OF [name] [address] [place] [address] Dept: [phone] Dept Fax: [phone]   Orders Placed This Encounter  Procedures   Miscellaneous [facility] test (send-out)    Standing Status:   Future    Number of Occurrences:   1    Expected Date:   04/08/2024    Expiration Date:   07/07/2024    Test name / description::   DYPD Genotyping test code [id number]    Release to patient:   Immediate   CBC with Differential    Standing Status:   Future    Number of Occurrences:   1    Expected Date:   04/08/2024    Expiration Date:   07/07/2024   Comprehensive metabolic panel    Standing Status:   Future    Number of Occurrences:   1    Expected Date:   04/08/2024    Expiration Date:   07/07/2024   Cancer antigen 15-3    Standing Status:   Future    Number of Occurrences:   1    Expected Date:   04/08/2024    Expiration Date:   07/07/2024   Cancer antigen 27.29    Standing Status:   Future    Number of Occurrences:   1    Expected Date:   04/08/2024    Expiration Date:   07/07/2024   Iron  and TIBC ([facility] ONLY)    Standing Status:   Future    Number of Occurrences:   1    Expected Date:   04/08/2024    Expiration Date:   07/07/2024   Ferritin    Standing Status:   Future    Number of Occurrences:   1     Expected Date:   04/08/2024    Expiration Date:   07/07/2024   Vitamin B12    Standing Status:   Future    Number of Occurrences:   1    Expected Date:   04/08/2024    Expiration Date:   07/07/2024   Folate    Standing Status:   Future    Number of Occurrences:   1    Expected Date:   04/08/2024    Expiration Date:   07/07/2024   CBC with Differential    Standing Status:   Future    Number of Occurrences:   1    Expiration Date:   04/08/2025   Comprehensive metabolic panel    Standing Status:   Future    Number of Occurrences:   1    Expiration Date:   04/08/2025   Cancer antigen 27.29    Standing Status:   Future    Number of Occurrences:   1    Expiration Date:  04/08/2025   Cancer antigen 15-3    Standing Status:   Future    Number of Occurrences:   1    Expiration Date:   04/08/2025   Iron  and TIBC ([facility] ONLY)    Standing Status:   Future    Number of Occurrences:   1    Expiration Date:   04/08/2025   Ferritin    Standing Status:   Future    Number of Occurrences:   1    Expiration Date:   04/08/2025   Vitamin B12    Standing Status:   Future    Number of Occurrences:   1    Expiration Date:   04/08/2025   Folate    Standing Status:   Future    Number of Occurrences:   1    Expiration Date:   04/08/2025     ONCOLOGY HISTORY:   I have reviewed her chart and materials related to her cancer extensively and collaborated history with the patient. Summary of oncologic history is as follows:   Diagnosis: Stage Ib (T1CN0) right breast cancer, ER positive, PR and HER-2 negative   -02/18/2018: Screening Mammogram: possible mass in the right breast. -02/26/2018: Diagnostic Mammogram and US  of right breast: Several small 4-5 mm cysts, with a bilobed cyst at the 1 o'clock position 6 cm from nipple measuring 0.5 x 0.3 x 0.4 cm. Irregular shadowing mass in the right breast at the 12:30 position 6 cm from nipple measuring 0.7 x 0.8 x 0.6 cm. -03/05/2018: Right  breast mass biopsy.  Pathology: Benign breast tissue with areas of dense hyaline fibrosis. No evidence of malignancy.  -08/20/2018: Diagnostic Right breast Mammogram: Suspicious mass in the 1 o'clock location of the RIGHT breast.  -08/27/2018: Right breast mass biopsy.  Pathology: Invasive ductal carcinoma. Based on the biopsy, the carcinoma appears Nottingham grade 3 of 3 and measures 0.5 cm in greatest linear extent. The tumor cells are ER positive (50%), PR negative (0%), and Her2 negative (2+). Ki67 60%. -09/18/2018: Right lumpectomy and sentinel lymph node biopsy.  Pathology: Invasive ductal carcinoma, grade 3, spanning 1.5 cm. High grade ductal carcinoma in situ with necrosis. Invasive carcinoma comes to within 0.1-0.2 cm of the anterior margin focally. Three of three lymph nodes negative for metastatic carcinoma.  Oncotype DX: 47 -10/30/2018 to 01/01/2019: 4 cycles of adjuvant AC -01/2019- 10/2023: Anastrozole  -09/02/2019: Mammogram: 8 mm group of indeterminate calcifications in the 3 o'clock position of the right breast. -09/12/2019: Right breast biopsy.  Pathology: DCIS, high nuclear grade with central necrosis and calcifications.  -02/18/2020: Right partial mastectomy. Pathology: Ductal carcinoma in situ, high-grade, with central necrosis and calcifications. No evidence of invasive carcinoma. Resection margins are negative for DCIS. Eleven lymph nodes, negative for carcinoma (0/11). -06/15/2020: Prophylactic Left simple mastectomy with right breast bisopy.  Pathology of left breast: Benign breast parenchyma with mild fibrocystic change.Negative for carcinoma. Pathology of right breast biopsy: Microscopic focus of invasive ductal carcinoma in a background of extensive high-grade ductal carcinoma in situ with necrosis and rare calcifications measuring 0.1 cm in greatest linear dimension.  -07/13/2020: Right breast lumpectomy.  Pathology: Multifocal invasive ductal carcinoma, grade 3,  largest spanning 1.2 cm. Extensive high grade ductal carcinoma in situ with necrosis. Invasive carcinoma is 1-2 mm from the medial margin focally and 2 mm from the lateral margin focally.  ER/PR/HER-2 is negative.  Ki-67 is 20%.  -09/14/2020: PET: Areas of nodularity about the RIGHT breast in this patient who is  undergone previous lumpectomy x2 and RIGHT mastectomy. Most suspicious area is associated with discrete nodule in the fat of the inferior chest wall within the subcutaneous tissues. Scattered small areas along the pectoralis musculature tracking laterally largest on image 56 of series 4 also associated with low level uptake. Small pulmonary nodules without interval change. -09/28/2020: US  of right chest wall: Mass-like abnormality in the upper inner aspect of the right anterior chest, designated at 1 o'clock, 7 cm from the central aspect of the mastectomy scar. This measures 4.1 cm in greatest dimension. 1.7 cm hypoechoic mass the central anterior right chest deep to the mid aspect of the mastectomy scar. Possible borderline enlarged residual right axillary lymph node.  -10/12/2020: Right breast biopsy.  Pathology: Invasive and in situ ductal carcinoma, 1.3 x 1.1 x 0.8 cm. Invasive carcinoma focally 0.1 cm from superior margin. DCIS less than 0.1 cm from superior margin.  -10/28/2020: [facility] results: Negative. -12/2020: Right mastectomy followed by radiation therapy completed. Patient decided against chemotherapy considering poor tolerance with the first time and bad peripheral neuropathy. -11/30/2022: PET: Recurrent/metastatic disease within the right medial pectoralis musculature in this patient who is status post bilateral mastectomy. A right suprarenal 8 mm nodule is likely arising from the adrenal gland. -12/19/2022: Right chest wall mass biopsy.  Pathology: Metastatic adenocarcinoma. Tumor cells are ER positive (40%), PR negative (0%), and Her2 negative (0). Ki-67 30%. -01/18/2023: Right  chest wall mass resection with right breast biopsy. -Pathology of right chest wall: Invasive ductal carcinoma (clinically recurrent), 1.7 cm, grade 3. Ductal carcinoma in situ, high-grade with necrosis. Carcinoma involves the inked inferior margin. Carcinoma focally invades into atrophic skeletal muscle. -Pathology of right breast biopsy: Grade 3 Invasive ductal carcinoma. Lymphovascular invasion not identified. Tumor cells ER negative (0%), PR negative (0%), and Her negative (0). Ki-67 35%. -04/18/2023: Patient's case discussed at multidisciplinary clinic at [facility]. Surgical oncology recommended radiation therapy. Patient also evaluated by medical oncology who recommended systemic therapy with 6 cycles of docetaxel , carboplatin  and pembrolizumab .  -05/07/2023 to 05/25/2023: XRT -06/14/2023 to 09/27/2023:  6 cycles of carboplatin , docetaxel  and pembrolizumab   -01/31/2024: PET: multiple abnormal lymph nodes identified in the left axillary region and chest wall which are hypermetabolic there is also a node in the supraclavicular region on the left. These are concerning for worsening metastatic disease. -02/26/2024: Left axillary lymph node biopsy.  -Pathology: Metastatic carcinoma involving a lymph node, compatible with patient's clinical history of primary breast carcinoma. All prognostic markers negative (0). Ki-67 20%.  -[facility] NGS: PDL1: Positive, CPS: 10, ER/PR/HER2/NEU: Triple negative, HER2: Negative, MSI-stable, BRAF, NTRK 1/2/3, RET: Negative, genomic LOH: Low, TMB: Low 32mut/Mb, ESR 1: Not detected, PALB2: Not detected PIK3CA: Not detected -03/03/2024: CT AP w contrast: New right lower lobe nodules worrisome for metastatic disease. -03/12/2024: BRCA1/2 analysis: Germline mutation analysis: NTHL1- Variant of unknown significance -03/26/2024: Patient was seen by Dr. [name] coons oncologist] at [facility], who recommended systemic treatment with pembrolizumab  and significantly  dose-reduced capecitabine  (500 mg BID). If disease progression occurs, next line of treatment may be dose-adjusted sacituzumab govitecan + pembrolizumab  (Considering poor data for either in patients with ESRD    Current Treatment:  Capecitabine (dose reduced ) + pembrolizumab   INTERVAL HISTORY:   Megan [name] is here today for follow up. Patient is accompanied by her husband today.  She reported feeling well and has baseline peripheral neuropathy in her hands and feet. She met with Dr. [name] recently.  Considering this is a local  recurrence, she recommended trying capecitabine  and pembrolizumab  (for PDL1 >10).  Discussed this option with her.  Discussed side effects in detail.  Patient is willing to proceed with this treatment.   I have reviewed the past medical history, past surgical history, social history and family history with the patient and they are unchanged from previous note.  ALLERGIES:  is allergic to motrin [ibuprofen].  MEDICATIONS:  Current Outpatient Medications  Medication Sig Dispense Refill   capecitabine  (XELODA ) 500 MG tablet Take 1 tablet (500 mg total) by mouth 2 (two) times daily after a meal. Take 500 mg by mouth twice daily. Take for 14 day with 7 days off. Repeat this every 21 days. 60 tablet 4   ondansetron  (ZOFRAN -ODT) 4 MG disintegrating tablet Take 1 tablet (4 mg total) by mouth every 8 (eight) hours as needed for nausea or vomiting. 90 tablet 2   acetaminophen  (TYLENOL ) 500 MG tablet Take 1,000 mg by mouth every 8 (eight) hours as needed for moderate pain (pain score 4-6).     amLODipine  (NORVASC ) 5 MG tablet Take 5 mg by mouth at bedtime.     aspirin  EC 81 MG tablet Take 81 mg by mouth daily.     atorvastatin  (LIPITOR) 20 MG tablet TAKE 1 TABLET(20 MG) BY MOUTH DAILY 90 tablet 0   B Complex-C-Zn-Folic Acid  (DIALYVITE  800-ZINC  15) 0.8 MG TABS Take 1 tablet by mouth daily.     cinacalcet  (SENSIPAR ) 90 MG tablet Take 90 mg by mouth daily.     Darbepoetin  Alfa (ARANESP , ALBUMIN FREE, IJ) Darbepoetin Alfa  (Aranesp )     fluticasone  (FLONASE ) 50 MCG/ACT nasal spray Place 1 spray into both nostrils daily.     gabapentin  (NEURONTIN ) 300 MG capsule TAKE 1 CAPSULE(300 MG) BY MOUTH AT BEDTIME 90 capsule 3   iron  sucrose (VENOFER ) 20 MG/ML injection Inject 50 mg into the vein once a week.     isosorbide  mononitrate (IMDUR ) 30 MG 24 hr tablet Take 0.5 tablets (15 mg total) by mouth daily. 15 tablet 6   lanthanum  (FOSRENOL ) 1000 MG chewable tablet Chew 2,000-3,000 mg by mouth See admin instructions. Take 3 tablets (3000 mg) by mouth with meals and take 2 tablets (2000 mg) with snacks     lidocaine -prilocaine  (EMLA ) cream APPLY TOPICALLY TO THE AFFECTED AREA 1 TIME 30 g 3   meclizine  (ANTIVERT ) 25 MG tablet Take 1 tablet (25 mg total) by mouth 3 (three) times daily as needed for dizziness. 30 tablet 1   nebivolol  (BYSTOLIC ) 10 MG tablet TAKE 1 TABLET BY MOUTH IN THE MORNING AND AT BEDTIME 180 tablet 0   nitroGLYCERIN  (NITROSTAT ) 0.4 MG SL tablet Place 1 tablet (0.4 mg total) under the tongue every 5 (five) minutes x 3 doses as needed for chest pain (if no relief after 3rd dose, proceed to ED or call 911). 25 tablet 3   ondansetron  (ZOFRAN ) 4 MG tablet Take 1 tablet (4 mg total) by mouth every 6 (six) hours. (Patient taking differently: Take 4 mg by mouth every 8 (eight) hours as needed for nausea or vomiting.) 12 tablet 0   oxyCODONE  (ROXICODONE ) 5 MG immediate release tablet Take 1 tablet (5 mg total) by mouth every 12 (twelve) hours as needed. 60 tablet 0   pantoprazole  (PROTONIX ) 40 MG tablet Take 1 tablet (40 mg total) by mouth 2 (two) times daily before a meal. 60 tablet 11   No current facility-administered medications for this visit.    REVIEW OF SYSTEMS:   Constitutional:  Denies fevers, chills or abnormal weight loss Eyes: Denies blurriness of vision Ears, nose, mouth, throat, and face: Denies mucositis or sore throat Respiratory: Denies cough,  dyspnea or wheezes Cardiovascular: Denies palpitation, chest discomfort or lower extremity swelling Gastrointestinal:  Denies nausea, heartburn or change in bowel habits Skin: Denies abnormal skin rashes Lymphatics: Denies new lymphadenopathy or easy bruising Neurological:Denies numbness, tingling or new weaknesses Behavioral/Psych: Mood is stable, no new changes  All other systems were reviewed with the patient and are negative.   VITALS:  Blood pressure 111/62, pulse 94, temperature 98.2 F (36.8 C), temperature source Oral, resp. rate 18, weight 180 lb 12.8 oz (82 kg), SpO2 98%.  Wt Readings from Last 3 Encounters:  04/08/24 180 lb 12.8 oz (82 kg)  04/03/24 181 lb 14.1 oz (82.5 kg)  04/01/24 181 lb 12.8 oz (82.5 kg)    Body mass index is 33.61 kg/m.  Performance status (ECOG): 0 - Asymptomatic  PHYSICAL EXAM:   GENERAL:alert, no distress and comfortable SKIN: skin color, texture, turgor are normal, no rashes or significant lesions EYES: normal, Conjunctiva are pink and non-injected, sclera clear BREAST: Right breast: S/p mastectomy with radiation skin changes.  Left breast: S/p mastectomy.  A 5 cm mass palpated in the left axillary region more towards the left upper outer quadrant of the breast.  Mobile.  Nontender.  No palpable lymphadenopathy on right side LUNGS: clear to auscultation and percussion with normal breathing effort HEART: regular rate & rhythm and no murmurs and no lower extremity edema ABDOMEN:abdomen soft, non-tender and normal bowel sounds Musculoskeletal:no cyanosis of digits and no clubbing  NEURO: alert & oriented x 3 with fluent speech, no focal motor/sensory deficits  LABORATORY DATA:  I have reviewed the data as listed   Lab Results  Component Value Date   WBC 9.2 04/08/2024   NEUTROABS 6.5 04/08/2024   HGB 10.5 (L) 04/08/2024   HCT 33.8 (L) 04/08/2024   MCV 111.9 (H) 04/08/2024   PLT 257 04/08/2024      Chemistry      Component Value  Date/Time   NA 137 04/08/2024 1043   K 3.5 04/08/2024 1043   CL 90 (L) 04/08/2024 1043   CO2 27 04/08/2024 1043   BUN 36 (H) 04/08/2024 1043   CREATININE 10.99 (H) 04/08/2024 1043   CREATININE 9.08 (H) 01/10/2024 1113      Component Value Date/Time   CALCIUM  9.1 04/08/2024 1043   ALKPHOS 88 04/08/2024 1043   AST 21 04/08/2024 1043   ALT 7 04/08/2024 1043   BILITOT 0.7 04/08/2024 1043        Latest Reference Range & Units 04/08/24 09:44 04/08/24 10:43  Iron  28 - 170 ug/dL 40   UIBC ug/dL 835   TIBC 749 - 549 ug/dL 795 (L)   Saturation Ratios 10.4 - 31.8 % 20   Ferritin 11 - 307 ng/mL 924 (H)   Folate >5.9 ng/mL  37.2  Vitamin B12 180 - 914 pg/mL 451   (L): Data is abnormally low (H): Data is abnormally high   Latest Reference Range & Units 12/26/22 09:28  CA 15-3 0.0 - 25.0 U/mL 26.0 (H)  (H): Data is abnormally high  RADIOGRAPHIC STUDIES: I have personally reviewed the radiological images as listed and agreed with the findings in the report.  CLINICAL DATA:  Subsequent treatment strategy for breast cancer.   EXAM: NUCLEAR MEDICINE PET SKULL BASE TO THIGH   TECHNIQUE: 9.12 mCi F-18 FDG was injected intravenously. Full-ring PET imaging  was performed from the skull base to thigh after the radiotracer. CT data was obtained and used for attenuation correction and anatomic localization.   Fasting blood glucose: 111 mg/dl   COMPARISON:  PET-CT 89/82/7975. Standard CT 01/24/2024. CT chest abdomen pelvis 11/07/2023.   FINDINGS: Mediastinal blood pool activity: SUV max 3.2   Liver activity: SUV max 3.7   NECK: There is a focus of uptake identified along the low left neck with maximum SUV of 6.4. This is just lateral to the left internal jugular vein at the level of the thyroid . Small lymph node in this location on image 37 measuring 6 mm. This was not seen on the previous PET-CT. This was seen on the more recent standard CT and was measured 8 mm in transverse  dimension. No additional areas of abnormal uptake seen in the lymph node chains of the neck. Near symmetric uptake of the visualized intracranial compartment.   Incidental CT findings: Parotid glands are unremarkable as are the submandibular glands. Small thyroid  gland. Paranasal sinuses and mastoid air cells are clear.   CHEST: There are numerous abnormal lymph nodes identified in the left upper chest including deep to the pectoralis muscles and extending into the left axilla. These are new from the previous PET-CT. Example left axillary node on image 60 measures 19 x 33 mm and has maximum SUV value of 14.7. Few other axillary nodes identified. There are up to 5 nodes deep to the pectoralis muscle and just caudal to the course of the left subclavian artery and vein. Example has maximum SUV of 14.0 and measures 17 x 10 mm on image 46. Again these were seen on the recent chest CT.   Otherwise no specific abnormal uptake identified in the mediastinum, hilum or right axillary region. The remote PET-CT had a focus of uptake along the right chest wall along the pectoralis muscle which has resolved. There is minimal thickening tissue in this location. No areas of abnormal lung uptake at this time.   Physiologic upper back muscle uptake.   Incidental CT findings: Heart is enlarged. No significant pericardial effusion. Scattered vascular calcifications. Patulous esophagus. Breathing motion. There are areas of scarring atelectatic changes the lungs. The nodular area along the anterior right hemithorax on image 58 is again seen and unchanged. This area has mild uptake of maximum SUV of 4.1. This could be posttreatment related. There are surgical clips in the right axillary region.   ABDOMEN/PELVIS: There is physiologic distribution radiotracer along the parenchymal organs, bowel and renal collecting systems. No abnormal uptake along nodal change in the abdomen and pelvis.   Incidental CT  findings: On this limited noncontrast CT, grossly the liver, spleen, adrenal glands and pancreas are unremarkable. Previous cholecystectomy. There is some presumed contrast along the course of the colon. The large bowel is nondilated as are loops of small bowel. The stomach is nondilated. There is severe bilateral renal atrophy with multiple calcifications and hyperdense cystic foci, unchanged from previous. Please correlate with prior contrast CT scan of 11/07/2023. Contracted urinary bladder. Extensive vascular calcifications. No definite free air or free fluid.   SKELETON: No specific abnormal uptake along the visualized osseous structures.   Incidental CT findings: Scattered degenerative changes.   IMPRESSION: As seen on the recent chest CT scan there are multiple abnormal lymph nodes identified in the left axillary region and chest wall which are hypermetabolic there is also a node in the supraclavicular region on the left. These are concerning for worsening metastatic disease.  The nodular areas seen a in the lungs on prior CT scan are not hypermetabolic and recommend simple attention on follow-up. No additional areas of abnormal uptake at this time.   Severe renal atrophy bilaterally with some calcifications and complex lesions. Please correlate with prior contrast CT scan. Cardiomegaly.     Electronically Signed   By: Ranell Bring M.D.   On: 02/06/2024 13:54

## 2024-04-08 NOTE — Assessment & Plan Note (Signed)
 Stage Ib(T1c N0) ER positive, PR and HER2 negative right breast cancer.  Diagnosed in 2019. S/p right breast lumpectomy, Oncotype DX score of 47.  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  Also had prophylactic left simple mastectomy. First recurrence: 07/13/2020.  Local recurrence, s/p right breast lumpectomy. Second occurrence: 10/12/2020.  Local recurrence.  S/p right mastectomy and radiation therapy.Patient decided against chemotherapy considering poor tolerance with the first time and bad peripheral neuropathy. Third recurrence: 11/30/2022: Within the right medial pectoralis musculature-local recurrence.  S/p right chest wall mass resection consistent with invasive ductal carcinoma.  Triple negative. S/p RT.  6 cycles of carboplatin /docetaxel  and pembrolizumab . PET scan in 01/2024 showed abnormal lymph nodes in the left axillary region.  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 Triple negative.  Germline mutation analysis: Negative.  Caris NGS: PD-L1: 10, no other targetable mutations  - Patient was evaluated by Dr. [name] at [facility] for second opinion.  She recommended doing a dose reduced capecitabine  considering poor renal function starting at 500 mg twice daily along with pembrolizumab  as patient has PD-L1 positivity.  Recommended 4-6 cycles, assessment after that and probably 1 year of pembrolizumab .  Treating with a curative intent.  If progressive on this regimen can consider sacituzumab govitecan + pembrolizumab   - I believe this is a reasonable choice considering this is a local recurrence and patient has peripheral neuropathy and cannot get chemotherapy.  Will order capecitabine  at a low dose. - We discussed side effects of capecitabine : Diarrhea, nausea, vomiting, loss of appetite, hand-foot syndrome.  Also discussed side effects of pembrolizumab : Rash, thyroid  irregularities, colitis, pneumonitis and  hepatitis. - Recommended patient to use diclofenac gel and urea  cream for hand and foot - Will repeat PET scan after 3 cycles of pembrolizumab   Return to clinic in 1 month with labs prior to second cycle of pembrolizumab 

## 2024-04-08 NOTE — Assessment & Plan Note (Signed)
-  Bone density on 01/20/2019 shows T score -1.9. -DEXA scan (03/24/2021) T score -2.4

## 2024-04-08 NOTE — Telephone Encounter (Unsigned)
 Clinical Pharmacist Practitioner Encounter   Received new prescription for Xeloda  (capecitabine ) for the treatment of recurrent breast cancer in conjunction with pembrolizumab , planned duration until disease progression or unacceptable drug toxicity.  CMP from 04/08/24 assessed, patient has ESRD and is receiving hemodialysis. Her dose of capecitabine  has been dose reduced for her dialysis.  Prescription dose and frequency assessed.   Current medication list in Epic reviewed, *** DDIs with *** identified:  Evaluated chart and *** patient barriers to medication adherence identified.   Prescription has been e-scribed to the [facility] for benefits analysis and approval.  Oral Oncology Clinic will continue to follow for insurance authorization, copayment issues, initial counseling and start date.   Soley Harriss N. Zaylah Blecha, PharmD, BCOP, CPP Hematology/Oncology Clinical Pharmacist [facility] Oral Chemotherapy Navigation Clinic [phone]  04/08/2024 1:44 PM

## 2024-04-08 NOTE — Assessment & Plan Note (Addendum)
 Stage Ib(T1c N0) ER positive, PR and HER2 negative right breast cancer.  Diagnosed in 2019. S/p right breast lumpectomy, Oncotype DX score of 47.  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  Also had prophylactic left simple mastectomy. First recurrence: 07/13/2020.  Local recurrence, s/p right breast lumpectomy. Second occurrence: 10/12/2020.  Local recurrence.  S/p right mastectomy and radiation therapy.Patient decided against chemotherapy considering poor tolerance with the first time and bad peripheral neuropathy. Third recurrence: 11/30/2022: Within the right medial pectoralis musculature-local recurrence.  S/p right chest wall mass resection consistent with invasive ductal carcinoma.  Triple negative. S/p RT.  6 cycles of carboplatin /docetaxel  and pembrolizumab . PET scan in 01/2024 showed abnormal lymph nodes in the left axillary region.  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 Triple negative.  Germline mutation analysis: Negative.  Caris NGS: PD-L1: 10, no other targetable mutations  - Patient was evaluated by Dr. [name] at [facility] for second opinion.  She recommended doing a dose reduced capecitabine  considering poor renal function starting at 500 mg twice daily along with pembrolizumab  as patient has PD-L1 positivity.  Recommended 4-6 cycles, assessment after that and probably 1 year of pembrolizumab .  Treating with a curative intent.  If progressive on this regimen can consider sacituzumab govitecan + pembrolizumab   - I believe this is a reasonable choice considering this is a local recurrence and patient has peripheral neuropathy and cannot get chemotherapy.  Will order capecitabine  at a low dose. -Can consider lymph node excision if patient has good response - We discussed side effects of capecitabine : Diarrhea, nausea, vomiting, loss of appetite, hand-foot syndrome.  Also discussed side effects of pembrolizumab :  Rash, thyroid  irregularities, colitis, pneumonitis and hepatitis. - Recommended patient to use diclofenac gel and urea  cream for hand and foot - Will repeat PET scan after 3 cycles of pembrolizumab  -Can consider sacituzumab govitecan + pembrolizumab  if there is no response  Return to clinic in 1 month with labs prior to second cycle of pembrolizumab 

## 2024-04-08 NOTE — Assessment & Plan Note (Addendum)
 Patient with ESRD on hemodialysis Monday Wednesday Friday  - Continue follow-up with nephrologist

## 2024-04-08 NOTE — Patient Instructions (Signed)
 [facility] at [facility] Discharge Instructions   You were seen and examined today by Dr. Davonna.  She discussed with you treatment options with the pills and the Keytruda . The Keytruda  is given in the clinic every 3 weeks. The pills are taken for two weeks in a row and one week off.   We will proceed with your treatment next week.   Return as scheduled.    Thank you for choosing [facility] at [facility] to provide your oncology and hematology care.  To afford each patient quality time with our provider, please arrive at least 15 minutes before your scheduled appointment time.   If you have a lab appointment with the Cancer Center please come in thru the Main Entrance and check in at the main information desk.  You need to re-schedule your appointment should you arrive 10 or more minutes late.  We strive to give you quality time with our providers, and arriving late affects you and other patients whose appointments are after yours.  Also, if you no show three or more times for appointments you may be dismissed from the clinic at the providers discretion.     Again, thank you for choosing [facility].  Our hope is that these requests will decrease the amount of time that you wait before being seen by our physicians.       _____________________________________________________________  Should you have questions after your visit to [facility], please contact our office at [phone] and follow the prompts.  Our office hours are 8:00 a.m. and 4:30 p.m. Monday - Friday.  Please note that voicemails left after 4:00 p.m. may not be returned until the following business day.  We are closed weekends and major holidays.  You do have access to a nurse 24-7, just call the main number to the clinic [phone] and do not press any options, hold on the line and a nurse will answer the phone.    For prescription refill requests,  have your pharmacy contact our office and allow 72 hours.    Due to Covid, you will need to wear a mask upon entering the hospital. If you do not have a mask, a mask will be given to you at the Main Entrance upon arrival. For doctor visits, patients may have 1 support person age 66 or older with them. For treatment visits, patients can not have anyone with them due to social distancing guidelines and our immunocompromised population.

## 2024-04-09 ENCOUNTER — Other Ambulatory Visit: Payer: Self-pay

## 2024-04-09 DIAGNOSIS — D509 Iron deficiency anemia, unspecified: Secondary | ICD-10-CM | POA: Diagnosis not present

## 2024-04-09 DIAGNOSIS — N186 End stage renal disease: Secondary | ICD-10-CM | POA: Diagnosis not present

## 2024-04-09 DIAGNOSIS — E1129 Type 2 diabetes mellitus with other diabetic kidney complication: Secondary | ICD-10-CM | POA: Diagnosis not present

## 2024-04-09 DIAGNOSIS — N2581 Secondary hyperparathyroidism of renal origin: Secondary | ICD-10-CM | POA: Diagnosis not present

## 2024-04-09 DIAGNOSIS — D631 Anemia in chronic kidney disease: Secondary | ICD-10-CM | POA: Diagnosis not present

## 2024-04-09 DIAGNOSIS — Z992 Dependence on renal dialysis: Secondary | ICD-10-CM | POA: Diagnosis not present

## 2024-04-09 LAB — CANCER ANTIGEN 27.29: CA 27.29: 98.7 U/mL — ABNORMAL HIGH (ref 0.0–38.6)

## 2024-04-09 LAB — CANCER ANTIGEN 15-3: CA 15-3: 67 U/mL — ABNORMAL HIGH (ref 0.0–25.0)

## 2024-04-10 ENCOUNTER — Other Ambulatory Visit: Payer: Self-pay

## 2024-04-10 ENCOUNTER — Telehealth: Payer: Self-pay | Admitting: Pharmacy Technician

## 2024-04-10 ENCOUNTER — Other Ambulatory Visit: Payer: Self-pay | Admitting: Pharmacy Technician

## 2024-04-10 MED ORDER — CAPECITABINE 500 MG PO TABS
500.0000 mg | ORAL_TABLET | Freq: Two times a day (BID) | ORAL | 4 refills | Status: DC
  Filled 2024-04-10: qty 28, 21d supply, fill #0
  Filled 2024-04-28: qty 28, 21d supply, fill #1

## 2024-04-10 NOTE — Progress Notes (Signed)
 Patient education documented in EPIC note on 04/10/24.

## 2024-04-10 NOTE — Telephone Encounter (Signed)
 Clinical Pharmacist Practitioner Encounter   [facility]Specialty) will deliver medication to patient on 04/15/24. Patient knows not to start until 04/17/24.  Patient Education I spoke with patient for overview of new oral chemotherapy medication: Xeloda  (capecitabine ) for the treatment of recurrent breast cancer in conjunction with pembrolizumab , planned duration until disease progression or unacceptable drug toxicity. Planned start 04/17/24.   Treatment goal: Palliative  Counseled patient on administration, dosing, side effects, monitoring, drug-food interactions, safe handling, storage, and disposal. Patient will take 1 tablet (500 mg total) by mouth 2 (two) times daily after a meal. Take for 14 days, then hold for 7 days. Repeat this every 21 days.   Side effects include but not limited to: diarrhea, hand-foot syndrome, mouth sores, edema, decreased wbc, fatigue, N/V Diarrhea: discussed with patient that depending on which medication is causing the diarrhea, that determines the best management for the diarrhea. They should call the office if diarrhea occurs, they can start loperamide  but still needs to call the office. Hand-foot syndrome: Recommended the use of Udderly Smooth Extra Care 20. Also recommended patient use diclofenac gel, 1 fingertip amount on palms of hands and soles of feet twice daily. Patient knows to report any skin changes they notice.  Mouth sores: Patient knows to request magic mouthwash if needed.    Reviewed with patient importance of keeping a medication schedule and plan for any missed doses.  After discussion with patient no patient barriers to medication adherence identified.   Distress evaluation: Distress thermometer not completed during telephone call as patient has been on previous lines of therapy.   Communication and Learning Assessment Primary learner: patient and her husband Barriers to learning: No barriers Preferred language: English Learning  preferences: Listening Reading   Mr. Popson voiced understanding and appreciation. All questions answered. Medication handout provided.  Provided patient with Oral Chemotherapy Navigation Clinic phone number. Patient knows to call the office with questions or concerns. Oral Chemotherapy Navigation Clinic will continue to follow.  Shantai Tiedeman N. Lavance Beazer, PharmD, BCOP, CPP Hematology/Oncology Clinical Pharmacist [facility] Oral Chemotherapy Navigation Clinic [phone]  04/10/2024 2:08 PM

## 2024-04-10 NOTE — Progress Notes (Signed)
 Specialty Pharmacy Initial Fill Coordination Note  Megan [name] is a 63 y.o. female contacted today regarding refills of specialty medication(s) Capecitabine  (XELODA ) .  Patient requested Delivery  on 04/15/24  to verified address [address] [place] [address] (leave on side door, ring doorbell)   Medication will be filled on 04/11/2024.   Patient is aware of $9.02 copayment.   [name] ([name]) Chet Burnet, CPhT  [facility], Zelda Salmon, [facility] Patient Advocate Specialist III Phone: [phone]  Fax: [phone]

## 2024-04-10 NOTE — Anesthesia Postprocedure Evaluation (Signed)
 Anesthesia Post Note  Patient: Megan [name]  Procedure(s) Performed: INSERTION, TUNNELED CENTRAL VENOUS DEVICE, WITH PORT (Right: Chest)  Patient location during evaluation: Phase II Anesthesia Type: General Level of consciousness: awake Pain management: pain level controlled Vital Signs Assessment: post-procedure vital signs reviewed and stable Respiratory status: spontaneous breathing and respiratory function stable Cardiovascular status: blood pressure returned to baseline and stable Postop Assessment: no headache and no apparent nausea or vomiting Anesthetic complications: no Comments: Late entry   No notable events documented.   Last Vitals:  Vitals:   04/03/24 0906 04/03/24 0915  BP:  (!) 140/73  Pulse: 74   Resp: 12   Temp: 36.5 C   SpO2: 97%     Last Pain:  Vitals:   04/04/24 1337  TempSrc:   PainSc: 0-No pain                 Yvonna JINNY Bosworth

## 2024-04-10 NOTE — Telephone Encounter (Signed)
 Patient successfully OnBoarded and drug education provided by pharmacist. Medication scheduled to be shipped on 08/29 for delivery on 09/02 from [facility] to patient's address. Patient also knows to call me at [phone] with any questions or concerns regarding receiving medication or if there is any unexpected change in co-pay.   Rhodesia Stanger ([name]) Chet Burnet, CPhT  [facility], Zelda Salmon, [facility] Patient Advocate Specialist III Phone: [phone]  Fax: [phone]

## 2024-04-11 ENCOUNTER — Encounter: Payer: Self-pay | Admitting: Gastroenterology

## 2024-04-11 ENCOUNTER — Other Ambulatory Visit: Payer: Self-pay

## 2024-04-11 DIAGNOSIS — Z992 Dependence on renal dialysis: Secondary | ICD-10-CM | POA: Diagnosis not present

## 2024-04-11 DIAGNOSIS — N186 End stage renal disease: Secondary | ICD-10-CM | POA: Diagnosis not present

## 2024-04-11 DIAGNOSIS — N2581 Secondary hyperparathyroidism of renal origin: Secondary | ICD-10-CM | POA: Diagnosis not present

## 2024-04-11 DIAGNOSIS — E1129 Type 2 diabetes mellitus with other diabetic kidney complication: Secondary | ICD-10-CM | POA: Diagnosis not present

## 2024-04-11 DIAGNOSIS — D631 Anemia in chronic kidney disease: Secondary | ICD-10-CM | POA: Diagnosis not present

## 2024-04-11 DIAGNOSIS — D509 Iron deficiency anemia, unspecified: Secondary | ICD-10-CM | POA: Diagnosis not present

## 2024-04-14 DIAGNOSIS — N2581 Secondary hyperparathyroidism of renal origin: Secondary | ICD-10-CM | POA: Diagnosis not present

## 2024-04-14 DIAGNOSIS — N186 End stage renal disease: Secondary | ICD-10-CM | POA: Diagnosis not present

## 2024-04-14 DIAGNOSIS — Z992 Dependence on renal dialysis: Secondary | ICD-10-CM | POA: Diagnosis not present

## 2024-04-14 DIAGNOSIS — D631 Anemia in chronic kidney disease: Secondary | ICD-10-CM | POA: Diagnosis not present

## 2024-04-14 DIAGNOSIS — N040 Nephrotic syndrome with minor glomerular abnormality: Secondary | ICD-10-CM | POA: Diagnosis not present

## 2024-04-14 DIAGNOSIS — E119 Type 2 diabetes mellitus without complications: Secondary | ICD-10-CM | POA: Diagnosis not present

## 2024-04-14 DIAGNOSIS — E1129 Type 2 diabetes mellitus with other diabetic kidney complication: Secondary | ICD-10-CM | POA: Diagnosis not present

## 2024-04-14 DIAGNOSIS — D509 Iron deficiency anemia, unspecified: Secondary | ICD-10-CM | POA: Diagnosis not present

## 2024-04-14 DIAGNOSIS — R52 Pain, unspecified: Secondary | ICD-10-CM | POA: Diagnosis not present

## 2024-04-15 LAB — MISC LABCORP TEST (SEND OUT): Labcorp test code: 512275

## 2024-04-16 ENCOUNTER — Ambulatory Visit (HOSPITAL_COMMUNITY)
Admission: RE | Admit: 2024-04-16 | Discharge: 2024-04-16 | Disposition: A | Discharge status: Home / Self Care | Attending: Vascular Surgery | Admitting: Vascular Surgery

## 2024-04-16 ENCOUNTER — Encounter (HOSPITAL_COMMUNITY)
Admission: RE | Disposition: A | Discharge status: Home / Self Care | Payer: Self-pay | Source: Home / Self Care | Attending: Vascular Surgery

## 2024-04-16 ENCOUNTER — Other Ambulatory Visit: Payer: Self-pay

## 2024-04-16 DIAGNOSIS — N186 End stage renal disease: Secondary | ICD-10-CM | POA: Insufficient documentation

## 2024-04-16 DIAGNOSIS — C773 Secondary and unspecified malignant neoplasm of axilla and upper limb lymph nodes: Secondary | ICD-10-CM | POA: Diagnosis not present

## 2024-04-16 DIAGNOSIS — M7989 Other specified soft tissue disorders: Secondary | ICD-10-CM | POA: Diagnosis not present

## 2024-04-16 DIAGNOSIS — Z992 Dependence on renal dialysis: Secondary | ICD-10-CM | POA: Insufficient documentation

## 2024-04-16 DIAGNOSIS — T82898A Other specified complication of vascular prosthetic devices, implants and grafts, initial encounter: Secondary | ICD-10-CM | POA: Diagnosis not present

## 2024-04-16 DIAGNOSIS — I132 Hypertensive heart and chronic kidney disease with heart failure and with stage 5 chronic kidney disease, or end stage renal disease: Secondary | ICD-10-CM | POA: Insufficient documentation

## 2024-04-16 DIAGNOSIS — D631 Anemia in chronic kidney disease: Secondary | ICD-10-CM | POA: Diagnosis not present

## 2024-04-16 DIAGNOSIS — C50911 Malignant neoplasm of unspecified site of right female breast: Secondary | ICD-10-CM | POA: Diagnosis not present

## 2024-04-16 DIAGNOSIS — E1122 Type 2 diabetes mellitus with diabetic chronic kidney disease: Secondary | ICD-10-CM | POA: Insufficient documentation

## 2024-04-16 DIAGNOSIS — R52 Pain, unspecified: Secondary | ICD-10-CM | POA: Diagnosis not present

## 2024-04-16 DIAGNOSIS — D509 Iron deficiency anemia, unspecified: Secondary | ICD-10-CM | POA: Diagnosis not present

## 2024-04-16 DIAGNOSIS — N2581 Secondary hyperparathyroidism of renal origin: Secondary | ICD-10-CM | POA: Diagnosis not present

## 2024-04-16 DIAGNOSIS — Z853 Personal history of malignant neoplasm of breast: Secondary | ICD-10-CM | POA: Diagnosis not present

## 2024-04-16 DIAGNOSIS — I5032 Chronic diastolic (congestive) heart failure: Secondary | ICD-10-CM | POA: Insufficient documentation

## 2024-04-16 DIAGNOSIS — C779 Secondary and unspecified malignant neoplasm of lymph node, unspecified: Secondary | ICD-10-CM | POA: Insufficient documentation

## 2024-04-16 HISTORY — PX: A/V SHUNT INTERVENTION: CATH118220

## 2024-04-16 SURGERY — A/V SHUNT INTERVENTION
Anesthesia: LOCAL | Site: Arm Lower | Laterality: Left

## 2024-04-16 MED ORDER — HEPARIN (PORCINE) IN NACL 1000-0.9 UT/500ML-% IV SOLN
INTRAVENOUS | Status: DC | PRN
Start: 2024-04-16 — End: 2024-04-16
  Administered 2024-04-16: 500 mL

## 2024-04-16 MED ORDER — LIDOCAINE HCL (PF) 1 % IJ SOLN
INTRAMUSCULAR | Status: AC
Start: 2024-04-16 — End: 2024-04-16
  Filled 2024-04-16: qty 30

## 2024-04-16 MED ORDER — LIDOCAINE HCL (PF) 1 % IJ SOLN
INTRAMUSCULAR | Status: DC | PRN
  Administered 2024-04-16: 2 mL via SUBCUTANEOUS

## 2024-04-16 MED ORDER — IODIXANOL 320 MG/ML IV SOLN
INTRAVENOUS | Status: DC | PRN
  Administered 2024-04-16: 25 mL via INTRAVENOUS

## 2024-04-16 SURGICAL SUPPLY — 5 items
KIT MICROPUNCTURE NIT STIFF (SHEATH) IMPLANT
SHEATH PROBE COVER 6X72 (BAG) IMPLANT
STOPCOCK MORSE 400PSI 3WAY (MISCELLANEOUS) IMPLANT
TRAY PV CATH (CUSTOM PROCEDURE TRAY) ×1 IMPLANT
TUBING CIL FLEX 10 FLL-RA (TUBING) IMPLANT

## 2024-04-16 NOTE — H&P (Signed)
 HD ACCESS CENTER H&P   Patient ID: Megan [name], female   DOB: October 17, 1960, 63 y.o.   MRN: [id number]  Subjective:     HPI Megan [name] is a 63 y.o. female with ESRD presenting to the HD access center for intervention.  Past Medical History:  Diagnosis Date   (HFpEF) heart failure with preserved ejection fraction (HCC)    a. 01/2019 Echo: EF 55-60%, mild conc LVH. DD.  Torn MV chordae.   Anemia    Atypical chest pain    a. 08/2018 MV: EF 59%, no ischemia; b. 02/2019 Cath: nonobs dzs.   Blood transfusion without reported diagnosis    Breast cancer (HCC) 10/12/2020   Cataract    ESRD (end stage renal disease) on dialysis [facility])    a. HD T, T, S   Essential hypertension, benign    GERD (gastroesophageal reflux disease)    Headache    Hemorrhoids    Mixed hyperlipidemia    Morbid obesity (HCC)    Non-obstructive CAD (coronary artery disease)    a. 02/2019 CathL LM nl, LAD 44m, LCX nl, RCA 25p, EF 55-65%.   PONV (postoperative nausea and vomiting)    Renal insufficiency    S/P colonoscopy 08/2009   Dr. Rollin: sessile polyp (benign lymphoid), large hemorrhoids, repeat 5-10 years   Temporal arteritis (HCC)    Type 2 diabetes mellitus (HCC)    Wears glasses    Family History  Problem Relation Age of Onset   Hypertension Mother    Coronary artery disease Mother    Diabetes Mother    Heart attack Father    Hypertension Sister    Coronary artery disease Sister    Hypertension Brother    Colon cancer Brother    Heart attack Maternal Grandmother    Heart attack Maternal Grandfather    Heart attack Paternal Grandmother    Heart attack Paternal Grandfather    Hypertension Son    Heart attack Maternal Aunt    Hypertension Maternal Aunt    Diabetes Maternal Aunt    Heart attack Maternal Uncle    Hypertension Maternal Uncle    Diabetes Maternal Uncle    Heart attack Paternal Aunt    Hypertension Paternal Aunt    Diabetes Paternal Aunt    Heart attack Paternal Uncle     Hypertension Paternal Uncle    Diabetes Paternal Uncle    Past Surgical History:  Procedure Laterality Date   ABDOMINAL HYSTERECTOMY     APPENDECTOMY     ARTERY BIOPSY N/A 05/09/2018   Procedure: RIGHT TEMPORAL ARTERY BIOPSY;  Surgeon: Kimble Agent, MD;  Location: [facility] OR;  Service: General;  Laterality: N/A;   BIOPSY  10/04/2023   Procedure: BIOPSY;  Surgeon: Shaaron Lamar HERO, MD;  Location: [facility] ENDO SUITE;  Service: Endoscopy;;   BREAST BIOPSY Right 06/15/2020   Procedure: RIGHT BREAST BIOPSY;  Surgeon: Mavis Anes, MD;  Location: [facility] ORS;  Service: General;  Laterality: Right;   CATARACT EXTRACTION W/PHACO Left 02/09/2017   Procedure: CATARACT EXTRACTION PHACO AND INTRAOCULAR LENS PLACEMENT LEFT EYE;  Surgeon: Perley Hamilton, MD;  Location: [facility] ORS;  Service: Ophthalmology;  Laterality: Left;  CDE: 4.89   CATARACT EXTRACTION W/PHACO Right 06/04/2017   Procedure: CATARACT EXTRACTION PHACO AND INTRAOCULAR LENS PLACEMENT (IOC);  Surgeon: Perley Hamilton, MD;  Location: [facility] ORS;  Service: Ophthalmology;  Laterality: Right;  CDE: 4.12   CHOLECYSTECTOMY  09/29/2011   Procedure: LAPAROSCOPIC CHOLECYSTECTOMY;  Surgeon: Anes DELENA Mavis, MD;  Location:  [facility] ORS;  Service: General;  Laterality: N/A;   COLONOSCOPY  08/2009   Dr. Rollin: sessile polyp (benign lymphoid), large hemorrhoids, repeat 5-10 years   COLONOSCOPY N/A 06/12/2016   prominent hemorrhoids   COLONOSCOPY WITH PROPOFOL  N/A 06/16/2021   Procedure: COLONOSCOPY WITH PROPOFOL ;  Surgeon: Shaaron Lamar HERO, MD;  Location: [facility];  Service: Endoscopy;  Laterality: N/A;  9:30am (dialysis pt)   ESOPHAGOGASTRODUODENOSCOPY  09/05/2011   MFM:Dfjoo hiatal hernia; remainder of exam normal. No explanation for patient's abdominal pain with today's examination   ESOPHAGOGASTRODUODENOSCOPY N/A 12/17/2013   Dr. Shaaron: gastric erythema, erosion, mild chronic inflammation on path    ESOPHAGOGASTRODUODENOSCOPY N/A 12/31/2023   Procedure: EGD  (ESOPHAGOGASTRODUODENOSCOPY);  Surgeon: Wilhelmenia Aloha Raddle., MD;  Location: [facility];  Service: Gastroenterology;  Laterality: N/A;   ESOPHAGOGASTRODUODENOSCOPY (EGD) WITH PROPOFOL  N/A 06/16/2021   Procedure: ESOPHAGOGASTRODUODENOSCOPY (EGD) WITH PROPOFOL ;  Surgeon: Shaaron Lamar HERO, MD;  [facility];  Service: Endoscopy;  Laterality: N/A;   ESOPHAGOGASTRODUODENOSCOPY (EGD) WITH PROPOFOL  N/A 10/04/2023   Procedure: ESOPHAGOGASTRODUODENOSCOPY (EGD) WITH PROPOFOL ;  Surgeon: Shaaron Lamar HERO, MD;  [facility];  Service: Endoscopy;  Laterality: N/A;  200pm, ok rm 1-2, pt knows to arrive at 6:45   EUS N/A 12/31/2023   Procedure: ULTRASOUND, UPPER GI TRACT, ENDOSCOPIC;  Surgeon: Wilhelmenia Aloha Raddle., MD;  Location: [facility];  Service: Gastroenterology;  Laterality: N/A;   EXCISION OF BREAST BIOPSY Right 10/12/2020   Procedure: EXCISION OF RIGHT BREAST BIOPSY;  Surgeon: Mavis Anes, MD;  Location: [facility];  Service: General;  Laterality: Right;   IR DIALY SHUNT INTRO NEEDLE/INTRACATH INITIAL W/IMG LEFT Left 07/17/2023   LAPAROSCOPIC APPENDECTOMY  09/29/2011   Procedure: APPENDECTOMY LAPAROSCOPIC;  Surgeon: Anes DELENA Mavis, MD;  Location: [facility];  Service: General;;  incidental appendectomy   LEFT HEART CATH AND CORONARY ANGIOGRAPHY N/A 02/28/2019   Procedure: LEFT HEART CATH AND CORONARY ANGIOGRAPHY;  Surgeon: Dann Candyce RAMAN, MD;  Location: [facility] INVASIVE CV LAB;  Service: Cardiovascular;  Laterality: N/A;   MASS EXCISION Right 01/18/2023   Procedure: EXCISION MASS, RIGHT CHEST S/P MASTECTOMY;  Surgeon: Mavis Anes, MD;  Location: [facility];  Service: General;  Laterality: Right;   MASTECTOMY MODIFIED RADICAL Right 02/18/2020   Procedure: MASTECTOMY MODIFIED RADICAL;  Surgeon: Mavis Anes, MD;  Location: [facility];  Service: General;  Laterality: Right;   MASTECTOMY, PARTIAL Right 07/13/2020   Procedure: RIGHT PARTIAL MASTECTOMY;  Surgeon: Mavis Anes, MD;  Location: [facility]  ORS;  Service: General;  Laterality: Right;   PARTIAL MASTECTOMY WITH NEEDLE LOCALIZATION AND AXILLARY SENTINEL LYMPH NODE BX Right 09/18/2018   Procedure: RIGHT PARTIAL MASTECTOMY AFTER NEEDLE LOCALIZATION, SENTINEL LYMPH NODE BIOPSY RIGHT AXILLA;  Surgeon: Mavis Anes, MD;  Location: [facility];  Service: General;  Laterality: Right;   POLYPECTOMY  06/16/2021   Procedure: POLYPECTOMY;  Surgeon: Shaaron Lamar HERO, MD;  [facility];  Service: Endoscopy;;   PORT-A-CATH REMOVAL Right 11/29/2023   Procedure: REMOVAL PORT-A-CATH;  Surgeon: Mavis Anes, MD;  Location: [facility];  Service: General;  Laterality: Right;  MINOR PROCEDURE ROOM   PORTACATH PLACEMENT Right 06/07/2023   Procedure: INSERTION PORT-A-CATH, RIGHT  (DIALYSIS ACCESS ON LEFT);  Surgeon: Mavis Anes, MD;  Location: [facility];  Service: General;  Laterality: Right;   PORTACATH PLACEMENT Right 04/03/2024   Procedure: INSERTION, TUNNELED CENTRAL VENOUS DEVICE, WITH PORT;  Surgeon: Mavis Anes, MD;  Location: [facility];  Service: General;  Laterality: Right;   SIMPLE MASTECTOMY WITH  AXILLARY SENTINEL NODE BIOPSY Left 06/15/2020   Procedure: LEFT SIMPLE MASTECTOMY;  Surgeon: Mavis Anes, MD;  Location: [facility];  Service: General;  Laterality: Left;    Short Social History:  Social History   Tobacco Use   Smoking status: Never    Passive exposure: Never   Smokeless tobacco: Never  Substance Use Topics   Alcohol use: No    Allergies  Allergen Reactions   Motrin [Ibuprofen] Other (See Comments)    ESRD     No current facility-administered medications for this encounter.    REVIEW OF SYSTEMS All other systems were reviewed and are negative     Objective:   Objective   Vitals:   04/16/24 1105  BP: (!) 163/83  Pulse: 74  Resp: 14  SpO2: 98%   There is no height or weight on file to calculate BMI.  Physical Exam General: no acute distress Cardiac: hemodynamically stable Extremities: Left arm AV fistula with  palpable thrill  Data: Fistulogram from December was reviewed No flow-limiting stenosis identified     Assessment/Plan:   Megan [name] is a 63 y.o. female with ESRD presenting for fistulogram.  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 Reviewed risks and benefits of fistulogram with intervention and patient agreed to proceed.   Norman Serve, MD [facility]

## 2024-04-16 NOTE — Op Note (Signed)
    Patient name: Megan [name] MRN: [id number] DOB: Dec 29, 1960 Sex: female  04/16/2024 Pre-operative Diagnosis: ESRD on HD Post-operative diagnosis:  Same Surgeon:  Norman GORMAN Serve, MD Procedure Performed:  [phone], ultrasound-guided access of dialysis circuit with interpretation of fistulogram and central venogram  Indications: Ms. send is a 63 year old female with ESRD on HD presenting to the HD access center today for fistulogram.  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  We discussed the risks and benefits of fistulogram with intervention, she expressed understanding and elected to proceed.  Findings:  Widely patent central venous system.  Widely patent but tortuous left arm radiocephalic fistula.  Widely patent anastomosis   Procedure:  The patient was identified in the holding area and taken to the cath lab  The patient was then placed supine on the table and prepped and draped in the usual sterile fashion.  A time out was called.  Ultrasound was used to evaluate the left arm AV access. This was accessed under u/s guidance. An 018 wire was advanced without resistance, a micropuncture sheath was placed and fistulagram obtained which demonstrated the above findings.  Contrast: 25cc  Sedation: None  Impression: Wide patency of left arm AV fistula and central venous system.  Left arm swelling likely related to new lymph node metastasis from previous right-sided breast cancer   Norman GORMAN Serve MD [facility] [place] Office: [phone]

## 2024-04-17 ENCOUNTER — Inpatient Hospital Stay

## 2024-04-17 ENCOUNTER — Other Ambulatory Visit: Payer: Self-pay

## 2024-04-17 ENCOUNTER — Encounter (HOSPITAL_COMMUNITY): Payer: Self-pay | Admitting: Vascular Surgery

## 2024-04-17 ENCOUNTER — Inpatient Hospital Stay: Attending: Hematology

## 2024-04-17 VITALS — BP 125/71 | HR 74 | Temp 97.3°F | Resp 18

## 2024-04-17 DIAGNOSIS — R1010 Upper abdominal pain, unspecified: Secondary | ICD-10-CM | POA: Insufficient documentation

## 2024-04-17 DIAGNOSIS — C50911 Malignant neoplasm of unspecified site of right female breast: Secondary | ICD-10-CM

## 2024-04-17 DIAGNOSIS — R110 Nausea: Secondary | ICD-10-CM

## 2024-04-17 DIAGNOSIS — R197 Diarrhea, unspecified: Secondary | ICD-10-CM | POA: Insufficient documentation

## 2024-04-17 DIAGNOSIS — Z1732 Human epidermal growth factor receptor 2 negative status: Secondary | ICD-10-CM | POA: Insufficient documentation

## 2024-04-17 DIAGNOSIS — R630 Anorexia: Secondary | ICD-10-CM | POA: Diagnosis not present

## 2024-04-17 DIAGNOSIS — N186 End stage renal disease: Secondary | ICD-10-CM | POA: Insufficient documentation

## 2024-04-17 DIAGNOSIS — R112 Nausea with vomiting, unspecified: Secondary | ICD-10-CM | POA: Diagnosis not present

## 2024-04-17 DIAGNOSIS — Z171 Estrogen receptor negative status [ER-]: Secondary | ICD-10-CM | POA: Insufficient documentation

## 2024-04-17 DIAGNOSIS — Z9221 Personal history of antineoplastic chemotherapy: Secondary | ICD-10-CM | POA: Insufficient documentation

## 2024-04-17 DIAGNOSIS — Z1722 Progesterone receptor negative status: Secondary | ICD-10-CM | POA: Insufficient documentation

## 2024-04-17 DIAGNOSIS — Z7982 Long term (current) use of aspirin: Secondary | ICD-10-CM | POA: Insufficient documentation

## 2024-04-17 DIAGNOSIS — Z9011 Acquired absence of right breast and nipple: Secondary | ICD-10-CM | POA: Diagnosis not present

## 2024-04-17 DIAGNOSIS — C50211 Malignant neoplasm of upper-inner quadrant of right female breast: Secondary | ICD-10-CM | POA: Diagnosis not present

## 2024-04-17 DIAGNOSIS — Z5112 Encounter for antineoplastic immunotherapy: Secondary | ICD-10-CM | POA: Insufficient documentation

## 2024-04-17 DIAGNOSIS — C799 Secondary malignant neoplasm of unspecified site: Secondary | ICD-10-CM

## 2024-04-17 DIAGNOSIS — Z79899 Other long term (current) drug therapy: Secondary | ICD-10-CM | POA: Insufficient documentation

## 2024-04-17 LAB — CBC WITH DIFFERENTIAL/PLATELET
Abs Immature Granulocytes: 0.04 K/uL (ref 0.00–0.07)
Basophils Absolute: 0.1 K/uL (ref 0.0–0.1)
Basophils Relative: 1 %
Eosinophils Absolute: 0.2 K/uL (ref 0.0–0.5)
Eosinophils Relative: 3 %
HCT: 34.2 % — ABNORMAL LOW (ref 36.0–46.0)
Hemoglobin: 10.4 g/dL — ABNORMAL LOW (ref 12.0–15.0)
Immature Granulocytes: 1 %
Lymphocytes Relative: 17 %
Lymphs Abs: 1.4 K/uL (ref 0.7–4.0)
MCH: 33.9 pg (ref 26.0–34.0)
MCHC: 30.4 g/dL (ref 30.0–36.0)
MCV: 111.4 fL — ABNORMAL HIGH (ref 80.0–100.0)
Monocytes Absolute: 0.8 K/uL (ref 0.1–1.0)
Monocytes Relative: 10 %
Neutro Abs: 5.7 K/uL (ref 1.7–7.7)
Neutrophils Relative %: 68 %
Platelets: 302 K/uL (ref 150–400)
RBC: 3.07 MIL/uL — ABNORMAL LOW (ref 3.87–5.11)
RDW: 15.6 % — ABNORMAL HIGH (ref 11.5–15.5)
Smear Review: NORMAL
WBC: 8.2 K/uL (ref 4.0–10.5)
nRBC: 0 % (ref 0.0–0.2)

## 2024-04-17 LAB — COMPREHENSIVE METABOLIC PANEL WITH GFR
ALT: 11 U/L (ref 0–44)
AST: 19 U/L (ref 15–41)
Albumin: 3.4 g/dL — ABNORMAL LOW (ref 3.5–5.0)
Alkaline Phosphatase: 82 U/L (ref 38–126)
Anion gap: 17 — ABNORMAL HIGH (ref 5–15)
BUN: 24 mg/dL — ABNORMAL HIGH (ref 8–23)
CO2: 30 mmol/L (ref 22–32)
Calcium: 8.3 mg/dL — ABNORMAL LOW (ref 8.9–10.3)
Chloride: 91 mmol/L — ABNORMAL LOW (ref 98–111)
Creatinine, Ser: 9.11 mg/dL — ABNORMAL HIGH (ref 0.44–1.00)
GFR, Estimated: 4 mL/min — ABNORMAL LOW (ref >60)
Glucose, Bld: 103 mg/dL — ABNORMAL HIGH (ref 70–99)
Potassium: 3.7 mmol/L (ref 3.5–5.1)
Sodium: 138 mmol/L (ref 135–145)
Total Bilirubin: 0.8 mg/dL (ref 0.0–1.2)
Total Protein: 7.6 g/dL (ref 6.5–8.1)

## 2024-04-17 LAB — TSH: TSH: 1.975 u[IU]/mL (ref 0.350–4.500)

## 2024-04-17 LAB — SAMPLE TO BLOOD BANK

## 2024-04-17 MED ORDER — SODIUM CHLORIDE 0.9 % IV SOLN
200.0000 mg | Freq: Once | INTRAVENOUS | Status: AC
  Administered 2024-04-17: 200 mg via INTRAVENOUS
  Filled 2024-04-17: qty 8

## 2024-04-17 MED ORDER — SODIUM CHLORIDE 0.9 % IV SOLN: INTRAVENOUS | Status: DC

## 2024-04-17 MED ORDER — PROCHLORPERAZINE MALEATE 10 MG PO TABS: 10.0000 mg | ORAL_TABLET | Freq: Four times a day (QID) | ORAL | 1 refills | Status: DC | PRN

## 2024-04-17 MED ORDER — FAMOTIDINE IN NACL 20-0.9 MG/50ML-% IV SOLN
20.0000 mg | Freq: Once | INTRAVENOUS | Status: AC
  Administered 2024-04-17: 20 mg via INTRAVENOUS
  Filled 2024-04-17: qty 50

## 2024-04-17 MED ORDER — ONDANSETRON HCL 8 MG PO TABS: 8.0000 mg | ORAL_TABLET | Freq: Three times a day (TID) | ORAL | 1 refills | Status: DC | PRN

## 2024-04-17 MED ORDER — LIDOCAINE-PRILOCAINE 2.5-2.5 % EX CREA: TOPICAL_CREAM | CUTANEOUS | 3 refills | Status: DC

## 2024-04-17 NOTE — Patient Instructions (Signed)
 [facility] [place] - A DEPT OF [facility]  Discharge Instructions: Thank you for choosing  Cancer Center to provide your oncology and hematology care.  If you have a lab appointment with the Cancer Center - please note that after April 8th, 2024, all labs will be drawn in the cancer center.  You do not have to check in or register with the main entrance as you have in the past but will complete your check-in in the cancer center.  Wear comfortable clothing and clothing appropriate for easy access to any Portacath or PICC line.   We strive to give you quality time with your provider. You may need to reschedule your appointment if you arrive late (15 or more minutes).  Arriving late affects you and other patients whose appointments are after yours.  Also, if you miss three or more appointments without notifying the office, you may be dismissed from the clinic at the provider's discretion.      For prescription refill requests, have your pharmacy contact our office and allow 72 hours for refills to be completed.    Today you received the following chemotherapy and/or immunotherapy agents Keytruda. Pembrolizumab Injection What is this medication? PEMBROLIZUMAB (PEM broe LIZ ue mab) treats some types of cancer. It works by helping your immune system slow or stop the spread of cancer cells. It is a monoclonal antibody. This medicine may be used for other purposes; ask your health care provider or pharmacist if you have questions. COMMON BRAND NAME(S): Keytruda What should I tell my care team before I take this medication? They need to know if you have any of these conditions: Allogeneic stem cell transplant (uses someone else's stem cells) Autoimmune diseases, such as Crohn disease, ulcerative colitis, lupus History of chest radiation Nervous system problems, such as Guillain-Barre syndrome, myasthenia gravis Organ transplant An unusual or allergic reaction to  pembrolizumab, other medications, foods, dyes, or preservatives Pregnant or trying to get pregnant Breast-feeding How should I use this medication? This medication is injected into a vein. It is given by your care team in a hospital or clinic setting. A special MedGuide will be given to you before each treatment. Be sure to read this information carefully each time. Talk to your care team about the use of this medication in children. While it may be prescribed for children as young as 6 months for selected conditions, precautions do apply. Overdosage: If you think you have taken too much of this medicine contact a poison control center or emergency room at once. NOTE: This medicine is only for you. Do not share this medicine with others. What if I miss a dose? Keep appointments for follow-up doses. It is important not to miss your dose. Call your care team if you are unable to keep an appointment. What may interact with this medication? Interactions have not been studied. This list may not describe all possible interactions. Give your health care provider a list of all the medicines, herbs, non-prescription drugs, or dietary supplements you use. Also tell them if you smoke, drink alcohol, or use illegal drugs. Some items may interact with your medicine. What should I watch for while using this medication? Your condition will be monitored carefully while you are receiving this medication. You may need blood work while taking this medication. This medication may cause serious skin reactions. They can happen weeks to months after starting the medication. Contact your care team right away if you notice  fevers or flu-like symptoms with a rash. The rash may be red or purple and then turn into blisters or peeling of the skin. You may also notice a red rash with swelling of the face, lips, or lymph nodes in your neck or under your arms. Tell your care team right away if you have any change in your  eyesight. Talk to your care team if you may be pregnant. Serious birth defects can occur if you take this medication during pregnancy and for 4 months after the last dose. You will need a negative pregnancy test before starting this medication. Contraception is recommended while taking this medication and for 4 months after the last dose. Your care team can help you find the option that works for you. Do not breastfeed while taking this medication and for 4 months after the last dose. What side effects may I notice from receiving this medication? Side effects that you should report to your care team as soon as possible: Allergic reactions--skin rash, itching, hives, swelling of the face, lips, tongue, or throat Dry cough, shortness of breath or trouble breathing Eye pain, redness, irritation, or discharge with blurry or decreased vision Heart muscle inflammation--unusual weakness or fatigue, shortness of breath, chest pain, fast or irregular heartbeat, dizziness, swelling of the ankles, feet, or hands Hormone gland problems--headache, sensitivity to light, unusual weakness or fatigue, dizziness, fast or irregular heartbeat, increased sensitivity to cold or heat, excessive sweating, constipation, hair loss, increased thirst or amount of urine, tremors or shaking, irritability Infusion reactions--chest pain, shortness of breath or trouble breathing, feeling faint or lightheaded Kidney injury (glomerulonephritis)--decrease in the amount of urine, red or dark brown urine, foamy or bubbly urine, swelling of the ankles, hands, or feet Liver injury--right upper belly pain, loss of appetite, nausea, light-colored stool, dark yellow or brown urine, yellowing skin or eyes, unusual weakness or fatigue Pain, tingling, or numbness in the hands or feet, muscle weakness, change in vision, confusion or trouble speaking, loss of balance or coordination, trouble walking, seizures Rash, fever, and swollen lymph  nodes Redness, blistering, peeling, or loosening of the skin, including inside the mouth Sudden or severe stomach pain, bloody diarrhea, fever, nausea, vomiting Side effects that usually do not require medical attention (report to your care team if they continue or are bothersome): Bone, joint, or muscle pain Diarrhea Fatigue Loss of appetite Nausea Skin rash This list may not describe all possible side effects. Call your doctor for medical advice about side effects. You may report side effects to FDA at 1-800-FDA-1088. Where should I keep my medication? This medication is given in a hospital or clinic. It will not be stored at home. NOTE: This sheet is a summary. It may not cover all possible information. If you have questions about this medicine, talk to your doctor, pharmacist, or health care provider.  2024 Elsevier/Gold Standard (2021-12-13 00:00:00)       To help prevent nausea and vomiting after your treatment, we encourage you to take your nausea medication as directed.  BELOW ARE SYMPTOMS THAT SHOULD BE REPORTED IMMEDIATELY: *FEVER GREATER THAN 100.4 F (38 C) OR HIGHER *CHILLS OR SWEATING *NAUSEA AND VOMITING THAT IS NOT CONTROLLED WITH YOUR NAUSEA MEDICATION *UNUSUAL SHORTNESS OF BREATH *UNUSUAL BRUISING OR BLEEDING *URINARY PROBLEMS (pain or burning when urinating, or frequent urination) *BOWEL PROBLEMS (unusual diarrhea, constipation, pain near the anus) TENDERNESS IN MOUTH AND THROAT WITH OR WITHOUT PRESENCE OF ULCERS (sore throat, sores in mouth, or a toothache) UNUSUAL RASH, SWELLING  OR PAIN  UNUSUAL VAGINAL DISCHARGE OR ITCHING   Items with * indicate a potential emergency and should be followed up as soon as possible or go to the Emergency Department if any problems should occur.  Please show the CHEMOTHERAPY ALERT CARD or IMMUNOTHERAPY ALERT CARD at check-in to the Emergency Department and triage nurse.  Should you have questions after your visit or need to  cancel or reschedule your appointment, please contact [facility] [place] - A DEPT OF [facility] [phone]  and follow the prompts.  Office hours are 8:00 a.m. to 4:30 p.m. Monday - Friday. Please note that voicemails left after 4:00 p.m. may not be returned until the following business day.  We are closed weekends and major holidays. You have access to a nurse at all times for urgent questions. Please call the main number to the clinic [phone] and follow the prompts.  For any non-urgent questions, you may also contact your provider using MyChart. We now offer e-Visits for anyone 42 and older to request care online for non-urgent symptoms. For details visit [email or web address].   Also download the MyChart app! Go to the app store, search "MyChart", open the app, select [facility], and log in with your MyChart username and password.

## 2024-04-17 NOTE — Progress Notes (Signed)
 Pharmacist Chemotherapy Monitoring - Initial Assessment    Anticipated start date: 03/18/24   The following has been reviewed per standard work regarding the patient's treatment regimen: The patient's diagnosis, treatment plan and drug doses, and organ/hematologic function Lab orders and baseline tests specific to treatment regimen  The treatment plan start date, drug sequencing, and pre-medications Prior authorization status  Patient's documented medication list, including drug-drug interaction screen and prescriptions for anti-emetics and supportive care specific to the treatment regimen The drug concentrations, fluid compatibility, administration routes, and timing of the medications to be used The patient's access for treatment and lifetime cumulative dose history, if applicable  The patient's medication allergies and previous infusion related reactions, if applicable   Changes made to treatment plan:  N/A  Follow up needed:  N/A  Patient currently receiving dialysis M-W-F. Capecitabine  dose reduced in setting of ESRD to 500 mg BID.   Niels FORBES Molt, [facility], 04/17/2024  9:43 AM

## 2024-04-17 NOTE — Progress Notes (Signed)
 Patient presents today for C1D1 chemotherapy/immunotherapy infusion of Keytruda . Consent signed. Patient is in satisfactory condition with no new complaints voiced.  Vital signs are stable.  Labs reviewed and all labs are within treatment parameters with exception of Creatinine 9.11. Dr Davonna made aware. Patient okay to receive treatment per Dr Armanda order. We will proceed with treatment per MD orders.   1100-Keytruda  complete patient now c/o right upper abd cramp with nausea. Dr Kent aware, order given for Pepcid  20mg  IV.  1130-Patient reports improvement in abd cramping, denies any pain. Dr Davonna made aware. Waiting for approval to be discharged.  Approval for discharge given. No complaints voiced.  Patient left ambulatory in stable condition with spouse.  Vital signs stable at discharge.  Follow up as scheduled.

## 2024-04-18 DIAGNOSIS — N2581 Secondary hyperparathyroidism of renal origin: Secondary | ICD-10-CM | POA: Diagnosis not present

## 2024-04-18 DIAGNOSIS — R52 Pain, unspecified: Secondary | ICD-10-CM | POA: Diagnosis not present

## 2024-04-18 DIAGNOSIS — D509 Iron deficiency anemia, unspecified: Secondary | ICD-10-CM | POA: Diagnosis not present

## 2024-04-18 DIAGNOSIS — D631 Anemia in chronic kidney disease: Secondary | ICD-10-CM | POA: Diagnosis not present

## 2024-04-18 DIAGNOSIS — Z992 Dependence on renal dialysis: Secondary | ICD-10-CM | POA: Diagnosis not present

## 2024-04-18 DIAGNOSIS — N186 End stage renal disease: Secondary | ICD-10-CM | POA: Diagnosis not present

## 2024-04-18 LAB — T4: T4, Total: 10 ug/dL (ref 4.5–12.0)

## 2024-04-21 DIAGNOSIS — D509 Iron deficiency anemia, unspecified: Secondary | ICD-10-CM | POA: Diagnosis not present

## 2024-04-21 DIAGNOSIS — R52 Pain, unspecified: Secondary | ICD-10-CM | POA: Diagnosis not present

## 2024-04-21 DIAGNOSIS — D631 Anemia in chronic kidney disease: Secondary | ICD-10-CM | POA: Diagnosis not present

## 2024-04-21 DIAGNOSIS — Z992 Dependence on renal dialysis: Secondary | ICD-10-CM | POA: Diagnosis not present

## 2024-04-21 DIAGNOSIS — N186 End stage renal disease: Secondary | ICD-10-CM | POA: Diagnosis not present

## 2024-04-21 DIAGNOSIS — N2581 Secondary hyperparathyroidism of renal origin: Secondary | ICD-10-CM | POA: Diagnosis not present

## 2024-04-23 DIAGNOSIS — R52 Pain, unspecified: Secondary | ICD-10-CM | POA: Diagnosis not present

## 2024-04-23 DIAGNOSIS — Z992 Dependence on renal dialysis: Secondary | ICD-10-CM | POA: Diagnosis not present

## 2024-04-23 DIAGNOSIS — N186 End stage renal disease: Secondary | ICD-10-CM | POA: Diagnosis not present

## 2024-04-23 DIAGNOSIS — N2581 Secondary hyperparathyroidism of renal origin: Secondary | ICD-10-CM | POA: Diagnosis not present

## 2024-04-23 DIAGNOSIS — D509 Iron deficiency anemia, unspecified: Secondary | ICD-10-CM | POA: Diagnosis not present

## 2024-04-23 DIAGNOSIS — D631 Anemia in chronic kidney disease: Secondary | ICD-10-CM | POA: Diagnosis not present

## 2024-04-25 ENCOUNTER — Other Ambulatory Visit: Payer: Self-pay

## 2024-04-25 DIAGNOSIS — N2581 Secondary hyperparathyroidism of renal origin: Secondary | ICD-10-CM | POA: Diagnosis not present

## 2024-04-25 DIAGNOSIS — D631 Anemia in chronic kidney disease: Secondary | ICD-10-CM | POA: Diagnosis not present

## 2024-04-25 DIAGNOSIS — R52 Pain, unspecified: Secondary | ICD-10-CM | POA: Diagnosis not present

## 2024-04-25 DIAGNOSIS — N186 End stage renal disease: Secondary | ICD-10-CM | POA: Diagnosis not present

## 2024-04-25 DIAGNOSIS — D509 Iron deficiency anemia, unspecified: Secondary | ICD-10-CM | POA: Diagnosis not present

## 2024-04-25 DIAGNOSIS — Z992 Dependence on renal dialysis: Secondary | ICD-10-CM | POA: Diagnosis not present

## 2024-04-28 ENCOUNTER — Other Ambulatory Visit: Payer: Self-pay

## 2024-04-28 ENCOUNTER — Other Ambulatory Visit (HOSPITAL_COMMUNITY): Payer: Self-pay

## 2024-04-28 DIAGNOSIS — N2581 Secondary hyperparathyroidism of renal origin: Secondary | ICD-10-CM | POA: Diagnosis not present

## 2024-04-28 DIAGNOSIS — D631 Anemia in chronic kidney disease: Secondary | ICD-10-CM | POA: Diagnosis not present

## 2024-04-28 DIAGNOSIS — R52 Pain, unspecified: Secondary | ICD-10-CM | POA: Diagnosis not present

## 2024-04-28 DIAGNOSIS — D509 Iron deficiency anemia, unspecified: Secondary | ICD-10-CM | POA: Diagnosis not present

## 2024-04-28 DIAGNOSIS — Z992 Dependence on renal dialysis: Secondary | ICD-10-CM | POA: Diagnosis not present

## 2024-04-28 DIAGNOSIS — N186 End stage renal disease: Secondary | ICD-10-CM | POA: Diagnosis not present

## 2024-04-28 NOTE — Progress Notes (Signed)
 Specialty Pharmacy Refill Coordination Note  Megan [name] is a 63 y.o. female contacted today regarding refills of specialty medication(s) Capecitabine  (XELODA )   Patient requested Delivery   Delivery date: 05/06/24   Verified address: [address] [place] [address]   Medication will be filled on 09.22.25.

## 2024-04-28 NOTE — Progress Notes (Signed)
 Specialty Pharmacy Ongoing Clinical Assessment Note  Megan [name] is a 63 y.o. female who is being followed by the specialty pharmacy service for RxSp Oncology   Patient's specialty medication(s) reviewed today: Capecitabine  (XELODA )   Missed doses in the last 4 weeks: 0   Patient/Caregiver did not have any additional questions or concerns.   Therapeutic benefit summary: Unable to assess   Adverse events/side effects summary: Experienced adverse events/side effects (occasional diarrhea, has imodium  on hand but hasn't needed it.)   Patient's therapy is appropriate to: Continue    Goals Addressed             This Visit's Progress    Slow Disease Progression       Patient is unable to be assessed as therapy was recently initiated. Patient will maintain adherence         Follow up: 3 months  [facility] Specialty Pharmacist

## 2024-04-30 DIAGNOSIS — N186 End stage renal disease: Secondary | ICD-10-CM | POA: Diagnosis not present

## 2024-04-30 DIAGNOSIS — Z992 Dependence on renal dialysis: Secondary | ICD-10-CM | POA: Diagnosis not present

## 2024-04-30 DIAGNOSIS — D631 Anemia in chronic kidney disease: Secondary | ICD-10-CM | POA: Diagnosis not present

## 2024-04-30 DIAGNOSIS — N2581 Secondary hyperparathyroidism of renal origin: Secondary | ICD-10-CM | POA: Diagnosis not present

## 2024-04-30 DIAGNOSIS — R52 Pain, unspecified: Secondary | ICD-10-CM | POA: Diagnosis not present

## 2024-04-30 DIAGNOSIS — D509 Iron deficiency anemia, unspecified: Secondary | ICD-10-CM | POA: Diagnosis not present

## 2024-05-02 ENCOUNTER — Other Ambulatory Visit: Payer: Self-pay

## 2024-05-02 DIAGNOSIS — R52 Pain, unspecified: Secondary | ICD-10-CM | POA: Diagnosis not present

## 2024-05-02 DIAGNOSIS — N2581 Secondary hyperparathyroidism of renal origin: Secondary | ICD-10-CM | POA: Diagnosis not present

## 2024-05-02 DIAGNOSIS — D631 Anemia in chronic kidney disease: Secondary | ICD-10-CM | POA: Diagnosis not present

## 2024-05-02 DIAGNOSIS — Z992 Dependence on renal dialysis: Secondary | ICD-10-CM | POA: Diagnosis not present

## 2024-05-02 DIAGNOSIS — D509 Iron deficiency anemia, unspecified: Secondary | ICD-10-CM | POA: Diagnosis not present

## 2024-05-02 DIAGNOSIS — N186 End stage renal disease: Secondary | ICD-10-CM | POA: Diagnosis not present

## 2024-05-05 ENCOUNTER — Other Ambulatory Visit: Payer: Self-pay

## 2024-05-05 DIAGNOSIS — Z992 Dependence on renal dialysis: Secondary | ICD-10-CM | POA: Diagnosis not present

## 2024-05-05 DIAGNOSIS — R52 Pain, unspecified: Secondary | ICD-10-CM | POA: Diagnosis not present

## 2024-05-05 DIAGNOSIS — D509 Iron deficiency anemia, unspecified: Secondary | ICD-10-CM | POA: Diagnosis not present

## 2024-05-05 DIAGNOSIS — N2581 Secondary hyperparathyroidism of renal origin: Secondary | ICD-10-CM | POA: Diagnosis not present

## 2024-05-05 DIAGNOSIS — D631 Anemia in chronic kidney disease: Secondary | ICD-10-CM | POA: Diagnosis not present

## 2024-05-05 DIAGNOSIS — N186 End stage renal disease: Secondary | ICD-10-CM | POA: Diagnosis not present

## 2024-05-06 ENCOUNTER — Inpatient Hospital Stay

## 2024-05-06 ENCOUNTER — Other Ambulatory Visit: Payer: Self-pay | Admitting: *Deleted

## 2024-05-06 ENCOUNTER — Inpatient Hospital Stay (HOSPITAL_BASED_OUTPATIENT_CLINIC_OR_DEPARTMENT_OTHER): Admitting: Oncology

## 2024-05-06 ENCOUNTER — Telehealth: Payer: Self-pay | Admitting: *Deleted

## 2024-05-06 VITALS — BP 143/67 | HR 82 | Temp 98.6°F | Resp 18 | Wt 172.2 lb

## 2024-05-06 DIAGNOSIS — R112 Nausea with vomiting, unspecified: Secondary | ICD-10-CM | POA: Diagnosis not present

## 2024-05-06 DIAGNOSIS — Z171 Estrogen receptor negative status [ER-]: Secondary | ICD-10-CM | POA: Diagnosis not present

## 2024-05-06 DIAGNOSIS — C50911 Malignant neoplasm of unspecified site of right female breast: Secondary | ICD-10-CM | POA: Diagnosis not present

## 2024-05-06 DIAGNOSIS — R197 Diarrhea, unspecified: Secondary | ICD-10-CM

## 2024-05-06 DIAGNOSIS — Z1722 Progesterone receptor negative status: Secondary | ICD-10-CM | POA: Diagnosis not present

## 2024-05-06 DIAGNOSIS — R110 Nausea: Secondary | ICD-10-CM

## 2024-05-06 DIAGNOSIS — Z1732 Human epidermal growth factor receptor 2 negative status: Secondary | ICD-10-CM | POA: Diagnosis not present

## 2024-05-06 DIAGNOSIS — N186 End stage renal disease: Secondary | ICD-10-CM | POA: Diagnosis not present

## 2024-05-06 DIAGNOSIS — C50211 Malignant neoplasm of upper-inner quadrant of right female breast: Secondary | ICD-10-CM

## 2024-05-06 DIAGNOSIS — Z5112 Encounter for antineoplastic immunotherapy: Secondary | ICD-10-CM | POA: Diagnosis not present

## 2024-05-06 LAB — CBC WITH DIFFERENTIAL/PLATELET
Abs Immature Granulocytes: 0.02 K/uL (ref 0.00–0.07)
Basophils Absolute: 0 K/uL (ref 0.0–0.1)
Basophils Relative: 1 %
Eosinophils Absolute: 0.3 K/uL (ref 0.0–0.5)
Eosinophils Relative: 3 %
HCT: 32.4 % — ABNORMAL LOW (ref 36.0–46.0)
Hemoglobin: 10.1 g/dL — ABNORMAL LOW (ref 12.0–15.0)
Immature Granulocytes: 0 %
Lymphocytes Relative: 16 %
Lymphs Abs: 1.3 K/uL (ref 0.7–4.0)
MCH: 35.3 pg — ABNORMAL HIGH (ref 26.0–34.0)
MCHC: 31.2 g/dL (ref 30.0–36.0)
MCV: 113.3 fL — ABNORMAL HIGH (ref 80.0–100.0)
Monocytes Absolute: 1 K/uL (ref 0.1–1.0)
Monocytes Relative: 11 %
Neutro Abs: 5.9 K/uL (ref 1.7–7.7)
Neutrophils Relative %: 69 %
Platelets: 268 K/uL (ref 150–400)
RBC: 2.86 MIL/uL — ABNORMAL LOW (ref 3.87–5.11)
RDW: 17.3 % — ABNORMAL HIGH (ref 11.5–15.5)
Smear Review: NORMAL
WBC: 8.5 K/uL (ref 4.0–10.5)
nRBC: 0 % (ref 0.0–0.2)

## 2024-05-06 LAB — COMPREHENSIVE METABOLIC PANEL WITH GFR
ALT: 13 U/L (ref 0–44)
AST: 22 U/L (ref 15–41)
Albumin: 3.1 g/dL — ABNORMAL LOW (ref 3.5–5.0)
Alkaline Phosphatase: 75 U/L (ref 38–126)
Anion gap: 15 (ref 5–15)
BUN: 28 mg/dL — ABNORMAL HIGH (ref 8–23)
CO2: 29 mmol/L (ref 22–32)
Calcium: 8.3 mg/dL — ABNORMAL LOW (ref 8.9–10.3)
Chloride: 94 mmol/L — ABNORMAL LOW (ref 98–111)
Creatinine, Ser: 10.06 mg/dL — ABNORMAL HIGH (ref 0.44–1.00)
GFR, Estimated: 4 mL/min — ABNORMAL LOW (ref >60)
Glucose, Bld: 102 mg/dL — ABNORMAL HIGH (ref 70–99)
Potassium: 3.8 mmol/L (ref 3.5–5.1)
Sodium: 138 mmol/L (ref 135–145)
Total Bilirubin: 0.7 mg/dL (ref 0.0–1.2)
Total Protein: 7.3 g/dL (ref 6.5–8.1)

## 2024-05-06 LAB — MAGNESIUM: Magnesium: 2.2 mg/dL (ref 1.7–2.4)

## 2024-05-06 MED ORDER — ONDANSETRON HCL 4 MG/2ML IJ SOLN
4.0000 mg | Freq: Once | INTRAMUSCULAR | Status: AC
  Administered 2024-05-06: 4 mg via INTRAVENOUS
  Filled 2024-05-06: qty 2

## 2024-05-06 MED ORDER — SODIUM CHLORIDE 0.9 % IV SOLN: INTRAVENOUS | Status: AC

## 2024-05-06 MED ORDER — LOPERAMIDE HCL 2 MG PO CAPS
4.0000 mg | ORAL_CAPSULE | ORAL | Status: DC | PRN
  Administered 2024-05-06: 4 mg via ORAL
  Filled 2024-05-06: qty 2

## 2024-05-06 MED ORDER — OLANZAPINE 5 MG PO TABS: 5.0000 mg | ORAL_TABLET | Freq: Every day | ORAL | 1 refills | Status: DC

## 2024-05-06 MED ORDER — DIPHENOXYLATE-ATROPINE 2.5-0.025 MG PO TABS: 1.0000 | ORAL_TABLET | Freq: Four times a day (QID) | ORAL | 0 refills | Status: DC | PRN

## 2024-05-06 NOTE — Telephone Encounter (Signed)
 Patient called to advise that she has been unable to keep down food and fluids x 3 days, associated with diarrhea.  States that she has been taking zofran  and imodium  as directed without effect.  Will bring in for lab and fluids.  Denies fever.  Dr. Davonna aware.

## 2024-05-06 NOTE — Assessment & Plan Note (Addendum)
 Likely secondary to capecitabine  use Normal DYPD metabolic activity  - Continue Imodium  as prescribed - Will prescribe Lomotil  1 tablet 4 times daily as needed

## 2024-05-06 NOTE — Addendum Note (Signed)
 Addended by: LEBRON MILLER B on: 05/06/2024 01:05 PM   Modules accepted: Orders

## 2024-05-06 NOTE — Assessment & Plan Note (Signed)
 Likely secondary to capecitabine   -Continue Zofran  and Compazine  as prescribed - Will start olanzapine  5 mg nightly

## 2024-05-06 NOTE — Assessment & Plan Note (Addendum)
 Stage Ib(T1c N0) ER positive, PR and HER2 negative right breast cancer.  Diagnosed in 2019. S/p right breast lumpectomy, Oncotype DX score of 47.  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  Also had prophylactic left simple mastectomy. First recurrence: 07/13/2020.  Local recurrence, s/p right breast lumpectomy. Second occurrence: 10/12/2020.  Local recurrence.  S/p right mastectomy and radiation therapy.Patient decided against chemotherapy considering poor tolerance with the first time and bad peripheral neuropathy. Third recurrence: 11/30/2022: Within the right medial pectoralis musculature-local recurrence.  S/p right chest wall mass resection consistent with invasive ductal carcinoma.  Triple negative. S/p RT.  6 cycles of carboplatin /docetaxel  and pembrolizumab . PET scan in 01/2024 showed abnormal lymph nodes in the left axillary region.  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 Triple negative.  Germline mutation analysis: Negative.  [facility] NGS: PD-L1: 10, no other targetable mutations Currently on capecitabine  and pembrolizumab   -Patient reports significant nausea vomiting and diarrhea from capecitabine .  Patient has normal DYPD metabolic activity. - Continue current dose of capecitabine  which is dose reduced at 500 mg twice daily for renal function but would provide with additional antiemetic and antidiarrheal. - Continue pembrolizumab  200 mg every 3 weeks - Recommended patient to use diclofenac gel and urea  cream for hand and foot - Will repeat PET scan after 3 cycles of pembrolizumab  - If labs are within parameters on Thursday, proceed with chemoimmunotherapy - If patient is not able to tolerate this regimen, can consider Sacituzumab with pembrolizumab   Return to clinic prior to next cycle of chemoimmunotherapy

## 2024-05-06 NOTE — Progress Notes (Signed)
 Patient Care Team: Duanne Butler DASEN, MD as PCP - General (Family Medicine) Alvan, Dorn FALCON, MD as PCP - Cardiology (Cardiology) Shaaron Lamar HERO, MD (Gastroenterology) Debera Jayson MATSU, MD as Consulting Physician (Cardiology) Rayburn Pac, MD as Consulting Physician (Nephrology)  Clinic Day:  05/06/2024  Referring physician: Duanne Butler DASEN, MD   CHIEF COMPLAINT:  CC: Stage Ib (T1CN0) right breast cancer, ER positive, PR and HER-2 negative    ASSESSMENT & PLAN:   Assessment & Plan: Megan [name]  is a 63 y.o. female with stage Ib (T1CN0) right breast cancer, ER positive, PR and HER-2 negative  Assessment & Plan Recurrent breast cancer, right (HCC) Stage Ib(T1c N0) ER positive, PR and HER2 negative right breast cancer.  Diagnosed in 2019. S/p right breast lumpectomy, Oncotype DX score of 47.  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  Also had prophylactic left simple mastectomy. First recurrence: 07/13/2020.  Local recurrence, s/p right breast lumpectomy. Second occurrence: 10/12/2020.  Local recurrence.  S/p right mastectomy and radiation therapy.Patient decided against chemotherapy considering poor tolerance with the first time and bad peripheral neuropathy. Third recurrence: 11/30/2022: Within the right medial pectoralis musculature-local recurrence.  S/p right chest wall mass resection consistent with invasive ductal carcinoma.  Triple negative. S/p RT.  6 cycles of carboplatin /docetaxel  and pembrolizumab . PET scan in 01/2024 showed abnormal lymph nodes in the left axillary region.  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 Triple negative.  Germline mutation analysis: Negative.  [facility] NGS: PD-L1: 10, no other targetable mutations Currently on capecitabine  and pembrolizumab   -Patient reports significant nausea vomiting and diarrhea from capecitabine .  Patient has normal DYPD  metabolic activity. - Continue current dose of capecitabine  which is dose reduced at 500 mg twice daily for renal function but would provide with additional antiemetic and antidiarrheal. - Continue pembrolizumab  200 mg every 3 weeks - Recommended patient to use diclofenac gel and urea  cream for hand and foot - Will repeat PET scan after 3 cycles of pembrolizumab  - If labs are within parameters on Thursday, proceed with chemoimmunotherapy - If patient is not able to tolerate this regimen, can consider Sacituzumab with pembrolizumab   Return to clinic prior to next cycle of chemoimmunotherapy Nausea and vomiting, unspecified vomiting type Likely secondary to capecitabine   -Continue Zofran  and Compazine  as prescribed - Will start olanzapine  5 mg nightly  Diarrhea, unspecified type Likely secondary to capecitabine  use Normal DYPD metabolic activity  - Continue Imodium  as prescribed - Will prescribe Lomotil  1 tablet 4 times daily as needed ESRD (end stage renal disease) (HCC) Patient with ESRD on hemodialysis Monday Wednesday Friday  - Continue follow-up with nephrologist    The patient understands the plans discussed today and is in agreement with them.  She knows to contact our office if she develops concerns prior to her next appointment.  30 minutes of total time was spent for this patient encounter, including preparation, face-to-face counseling with the patient and coordination of care, physical exam, and documentation of the encounter. > 50% of the time was spent on counseling as documented under my assessment and plan.    Mickiel Dry, MD  [facility] [place] - A DEPT OF [name] [address] [place] [address] Dept: [phone] Dept Fax: [phone]   No orders of the defined types were placed in this encounter.    ONCOLOGY HISTORY:   I have  reviewed her chart and materials related to her cancer  extensively and collaborated history with the patient. Summary of oncologic history is as follows:   Diagnosis: Stage Ib (T1CN0) right breast cancer, ER positive, PR and HER-2 negative   -02/18/2018: Screening Mammogram: possible mass in the right breast. -02/26/2018: Diagnostic Mammogram and US  of right breast: Several small 4-5 mm cysts, with a bilobed cyst at the 1 o'clock position 6 cm from nipple measuring 0.5 x 0.3 x 0.4 cm. Irregular shadowing mass in the right breast at the 12:30 position 6 cm from nipple measuring 0.7 x 0.8 x 0.6 cm. -03/05/2018: Right breast mass biopsy.  Pathology: Benign breast tissue with areas of dense hyaline fibrosis. No evidence of malignancy.  -08/20/2018: Diagnostic Right breast Mammogram: Suspicious mass in the 1 o'clock location of the RIGHT breast.  -08/27/2018: Right breast mass biopsy.  Pathology: Invasive ductal carcinoma. Based on the biopsy, the carcinoma appears Nottingham grade 3 of 3 and measures 0.5 cm in greatest linear extent. The tumor cells are ER positive (50%), PR negative (0%), and Her2 negative (2+). Ki67 60%. -09/18/2018: Right lumpectomy and sentinel lymph node biopsy.  Pathology: Invasive ductal carcinoma, grade 3, spanning 1.5 cm. High grade ductal carcinoma in situ with necrosis. Invasive carcinoma comes to within 0.1-0.2 cm of the anterior margin focally. Three of three lymph nodes negative for metastatic carcinoma.  Oncotype DX: 47 -10/30/2018 to 01/01/2019: 4 cycles of adjuvant AC -01/2019- 10/2023: Anastrozole  -09/02/2019: Mammogram: 8 mm group of indeterminate calcifications in the 3 o'clock position of the right breast. -09/12/2019: Right breast biopsy.  Pathology: DCIS, high nuclear grade with central necrosis and calcifications.  -02/18/2020: Right partial mastectomy. Pathology: Ductal carcinoma in situ, high-grade, with central necrosis and calcifications. No evidence of invasive carcinoma. Resection margins are negative for  DCIS. Eleven lymph nodes, negative for carcinoma (0/11). -06/15/2020: Prophylactic Left simple mastectomy with right breast bisopy.  Pathology of left breast: Benign breast parenchyma with mild fibrocystic change.Negative for carcinoma. Pathology of right breast biopsy: Microscopic focus of invasive ductal carcinoma in a background of extensive high-grade ductal carcinoma in situ with necrosis and rare calcifications measuring 0.1 cm in greatest linear dimension.  -07/13/2020: Right breast lumpectomy.  Pathology: Multifocal invasive ductal carcinoma, grade 3, largest spanning 1.2 cm. Extensive high grade ductal carcinoma in situ with necrosis. Invasive carcinoma is 1-2 mm from the medial margin focally and 2 mm from the lateral margin focally.  ER/PR/HER-2 is negative.  Ki-67 is 20%.  -09/14/2020: PET: Areas of nodularity about the RIGHT breast in this patient who is undergone previous lumpectomy x2 and RIGHT mastectomy. Most suspicious area is associated with discrete nodule in the fat of the inferior chest wall within the subcutaneous tissues. Scattered small areas along the pectoralis musculature tracking laterally largest on image 56 of series 4 also associated with low level uptake. Small pulmonary nodules without interval change. -09/28/2020: US  of right chest wall: Mass-like abnormality in the upper inner aspect of the right anterior chest, designated at 1 o'clock, 7 cm from the central aspect of the mastectomy scar. This measures 4.1 cm in greatest dimension. 1.7 cm hypoechoic mass the central anterior right chest deep to the mid aspect of the mastectomy scar. Possible borderline enlarged residual right axillary lymph node.  -10/12/2020: Right breast biopsy.  Pathology: Invasive and in situ ductal carcinoma, 1.3 x 1.1 x 0.8 cm. Invasive carcinoma focally 0.1 cm from superior margin. DCIS less than 0.1 cm from superior margin.  -10/28/2020: [facility] results:  Negative. -12/2020: Right mastectomy  followed by radiation therapy completed. Patient decided against chemotherapy considering poor tolerance with the first time and bad peripheral neuropathy. -11/30/2022: PET: Recurrent/metastatic disease within the right medial pectoralis musculature in this patient who is status post bilateral mastectomy. A right suprarenal 8 mm nodule is likely arising from the adrenal gland. -12/19/2022: Right chest wall mass biopsy.  Pathology: Metastatic adenocarcinoma. Tumor cells are ER positive (40%), PR negative (0%), and Her2 negative (0). Ki-67 30%. -01/18/2023: Right chest wall mass resection with right breast biopsy. -Pathology of right chest wall: Invasive ductal carcinoma (clinically recurrent), 1.7 cm, grade 3. Ductal carcinoma in situ, high-grade with necrosis. Carcinoma involves the inked inferior margin. Carcinoma focally invades into atrophic skeletal muscle. -Pathology of right breast biopsy: Grade 3 Invasive ductal carcinoma. Lymphovascular invasion not identified. Tumor cells ER negative (0%), PR negative (0%), and Her negative (0). Ki-67 35%. -04/18/2023: Patient's case discussed at multidisciplinary clinic at [facility]. Surgical oncology recommended radiation therapy. Patient also evaluated by medical oncology who recommended systemic therapy with 6 cycles of docetaxel , carboplatin  and pembrolizumab .  -05/07/2023 to 05/25/2023: XRT -06/14/2023 to 09/27/2023:  6 cycles of carboplatin , docetaxel  and pembrolizumab   -01/31/2024: PET: multiple abnormal lymph nodes identified in the left axillary region and chest wall which are hypermetabolic there is also a node in the supraclavicular region on the left. These are concerning for worsening metastatic disease. -02/26/2024: Left axillary lymph node biopsy.  -Pathology: Metastatic carcinoma involving a lymph node, compatible with patient's clinical history of primary breast carcinoma. All prognostic markers negative (0). Ki-67 20%.  -[facility] NGS: PDL1:  Positive, CPS: 10, ER/PR/HER2/NEU: Triple negative, HER2: Negative, MSI-stable, BRAF, NTRK 1/2/3, RET: Negative, genomic LOH: Low, TMB: Low 68mut/Mb, ESR 1: Not detected, PALB2: Not detected PIK3CA: Not detected -03/03/2024: CT AP w contrast: New right lower lobe nodules worrisome for metastatic disease. -03/12/2024: BRCA1/2 analysis: Germline mutation analysis: NTHL1- Variant of unknown significance -03/26/2024: Patient was seen by Dr. [name] coons oncologist] at [facility], who recommended systemic treatment with pembrolizumab  and significantly dose-reduced capecitabine  (500 mg BID). If disease progression occurs, next line of treatment may be dose-adjusted sacituzumab govitecan + pembrolizumab  (Considering poor data for either in patients with ESRD  -04/17/2024-Current:  Capecitabine (dose reduced ) + pembrolizumab    Current Treatment:  Capecitabine (dose reduced ) + pembrolizumab   INTERVAL HISTORY:   Megan [name] is here today for follow up. Patient is accompanied by her husband today.    Patient came in for asymptomatic visit today because of severe nausea, vomiting and diarrhea.  Patient reported nausea not being controlled with Zofran  and Compazine .  She also reports several episodes of diarrhea up to 4-5 episodes a day occasionally not being controlled with Imodium .  She also reports some abdominal cramping and upper abdominal pain.  She denies any urinary symptoms today.  She denies fever, chills.  She does have some loss of appetite but did not lose significant weight.    I have reviewed the past medical history, past surgical history, social history and family history with the patient and they are unchanged from previous note.  ALLERGIES:  is allergic to motrin [ibuprofen].  MEDICATIONS:  Current Outpatient Medications  Medication Sig Dispense Refill   acetaminophen  (TYLENOL ) 500 MG tablet Take 1,000 mg by mouth every 8 (eight) hours as needed for moderate pain (pain  score 4-6).     amLODipine  (NORVASC ) 5 MG tablet Take 5 mg by mouth at bedtime.     aspirin  EC  81 MG tablet Take 81 mg by mouth daily.     atorvastatin  (LIPITOR) 20 MG tablet TAKE 1 TABLET(20 MG) BY MOUTH DAILY 90 tablet 0   B Complex-C-Zn-Folic Acid  (DIALYVITE  800-ZINC  15) 0.8 MG TABS Take 1 tablet by mouth daily.     capecitabine  (XELODA ) 500 MG tablet Take 1 tablet (500 mg total) by mouth 2 (two) times daily after a meal. Take for 14 days, then hold for 7 days. Repeat this every 21 days. 28 tablet 4   cinacalcet  (SENSIPAR ) 90 MG tablet Take 90 mg by mouth daily.     Darbepoetin Alfa  (ARANESP , ALBUMIN FREE, IJ) Darbepoetin Alfa  (Aranesp )     diphenoxylate -atropine  (LOMOTIL ) 2.5-0.025 MG tablet Take 1 tablet by mouth 4 (four) times daily as needed for diarrhea or loose stools. 120 tablet 0   fluticasone  (FLONASE ) 50 MCG/ACT nasal spray Place 1 spray into both nostrils daily.     gabapentin  (NEURONTIN ) 300 MG capsule TAKE 1 CAPSULE(300 MG) BY MOUTH AT BEDTIME 90 capsule 3   isosorbide  mononitrate (IMDUR ) 30 MG 24 hr tablet Take 0.5 tablets (15 mg total) by mouth daily. 15 tablet 6   lanthanum  (FOSRENOL ) 1000 MG chewable tablet Chew 2,000-3,000 mg by mouth See admin instructions. Take 3 tablets (3000 mg) by mouth with meals and take 2 tablets (2000 mg) with snacks     lidocaine -prilocaine  (EMLA ) cream APPLY TOPICALLY TO THE AFFECTED AREA 1 TIME 30 g 3   lidocaine -prilocaine  (EMLA ) cream Apply to affected area once 30 g 3   loperamide  (IMODIUM  A-D) 2 MG tablet Take 2 mg by mouth 4 (four) times daily as needed for diarrhea or loose stools.     meclizine  (ANTIVERT ) 25 MG tablet Take 1 tablet (25 mg total) by mouth 3 (three) times daily as needed for dizziness. 30 tablet 1   nebivolol  (BYSTOLIC ) 10 MG tablet TAKE 1 TABLET BY MOUTH IN THE MORNING AND AT BEDTIME 180 tablet 0   nitroGLYCERIN  (NITROSTAT ) 0.4 MG SL tablet Place 1 tablet (0.4 mg total) under the tongue every 5 (five) minutes x 3 doses as  needed for chest pain (if no relief after 3rd dose, proceed to ED or call 911). 25 tablet 3   OLANZapine  (ZYPREXA ) 5 MG tablet Take 1 tablet (5 mg total) by mouth at bedtime. 60 tablet 1   ondansetron  (ZOFRAN ) 8 MG tablet Take 1 tablet (8 mg total) by mouth every 8 (eight) hours as needed for nausea or vomiting. 30 tablet 1   oxyCODONE  (ROXICODONE ) 5 MG immediate release tablet Take 1 tablet (5 mg total) by mouth every 12 (twelve) hours as needed. 60 tablet 0   pantoprazole  (PROTONIX ) 40 MG tablet Take 1 tablet (40 mg total) by mouth 2 (two) times daily before a meal. 60 tablet 11   prochlorperazine  (COMPAZINE ) 10 MG tablet Take 1 tablet (10 mg total) by mouth every 6 (six) hours as needed for nausea or vomiting. 30 tablet 1   No current facility-administered medications for this visit.   Facility-Administered Medications Ordered in Other Visits  Medication Dose Route Frequency Provider Last Rate Last Admin   0.9 %  sodium chloride  infusion   Intravenous Continuous Akirah Storck, MD   Stopped at 05/06/24 1450    REVIEW OF SYSTEMS:   Constitutional: Denies fevers, chills or abnormal weight loss Eyes: Denies blurriness of vision Ears, nose, mouth, throat, and face: Denies mucositis or sore throat Respiratory: Denies cough, dyspnea or wheezes Cardiovascular: Denies palpitation, chest discomfort or lower extremity  swelling Gastrointestinal:  Denies nausea, heartburn or change in bowel habits Skin: Denies abnormal skin rashes Lymphatics: Denies new lymphadenopathy or easy bruising Neurological:Denies numbness, tingling or new weaknesses Behavioral/Psych: Mood is stable, no new changes  All other systems were reviewed with the patient and are negative.   VITALS:  There were no vitals taken for this visit.  Wt Readings from Last 3 Encounters:  05/06/24 172 lb 3.2 oz (78.1 kg)  04/17/24 176 lb 5.9 oz (80 kg)  04/08/24 180 lb 12.8 oz (82 kg)    There is no height or weight on file to  calculate BMI.  Performance status (ECOG): 0 - Asymptomatic  PHYSICAL EXAM:   GENERAL:alert, no distress and comfortable SKIN: skin color, texture, turgor are normal, no rashes or significant lesions EYES: normal, Conjunctiva are pink and non-injected, sclera clear LUNGS: clear to auscultation and percussion with normal breathing effort HEART: regular rate & rhythm and no murmurs and no lower extremity edema ABDOMEN:abdomen soft, non-tender and normal bowel sounds Musculoskeletal:no cyanosis of digits and no clubbing  NEURO: alert & oriented x 3 with fluent speech, no focal motor/sensory deficits  LABORATORY DATA:  I have reviewed the data as listed   Lab Results  Component Value Date   WBC 8.5 05/06/2024   NEUTROABS 5.9 05/06/2024   HGB 10.1 (L) 05/06/2024   HCT 32.4 (L) 05/06/2024   MCV 113.3 (H) 05/06/2024   PLT 268 05/06/2024      Chemistry      Component Value Date/Time   NA 138 05/06/2024 1203   K 3.8 05/06/2024 1203   CL 94 (L) 05/06/2024 1203   CO2 29 05/06/2024 1203   BUN 28 (H) 05/06/2024 1203   CREATININE 10.06 (H) 05/06/2024 1203   CREATININE 9.08 (H) 01/10/2024 1113      Component Value Date/Time   CALCIUM  8.3 (L) 05/06/2024 1203   ALKPHOS 75 05/06/2024 1203   AST 22 05/06/2024 1203   ALT 13 05/06/2024 1203   BILITOT 0.7 05/06/2024 1203        Latest Reference Range & Units 04/17/24 08:04  TSH 0.350 - 4.500 uIU/mL 1.975  Thyroxine (T4) 4.5 - 12.0 ug/dL 89.9     Latest Reference Range & Units 04/08/24 09:44 04/08/24 10:43  Iron  28 - 170 ug/dL 40   UIBC ug/dL 835   TIBC 749 - 549 ug/dL 795 (L)   Saturation Ratios 10.4 - 31.8 % 20   Ferritin 11 - 307 ng/mL 924 (H)   Folate >5.9 ng/mL  37.2  Vitamin B12 180 - 914 pg/mL 451   (L): Data is abnormally low (H): Data is abnormally high   Latest Reference Range & Units 12/26/22 09:28  CA 15-3 0.0 - 25.0 U/mL 26.0 (H)  (H): Data is abnormally high  RADIOGRAPHIC STUDIES: I have personally  reviewed the radiological images as listed and agreed with the findings in the report.  None to review

## 2024-05-06 NOTE — Addendum Note (Signed)
 Addended by: LEBRON MILLER B on: 05/06/2024 12:51 PM   Modules accepted: Orders

## 2024-05-06 NOTE — Assessment & Plan Note (Addendum)
 Patient with ESRD on hemodialysis Monday Wednesday Friday  - Continue follow-up with nephrologist

## 2024-05-06 NOTE — Assessment & Plan Note (Signed)
 Stage Ib(T1c N0) ER positive, PR and HER2 negative right breast cancer.  Diagnosed in 2019. S/p right breast lumpectomy, Oncotype DX score of 47.  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  Also had prophylactic left simple mastectomy. First recurrence: 07/13/2020.  Local recurrence, s/p right breast lumpectomy. Second occurrence: 10/12/2020.  Local recurrence.  S/p right mastectomy and radiation therapy.Patient decided against chemotherapy considering poor tolerance with the first time and bad peripheral neuropathy. Third recurrence: 11/30/2022: Within the right medial pectoralis musculature-local recurrence.  S/p right chest wall mass resection consistent with invasive ductal carcinoma.  Triple negative. S/p RT.  6 cycles of carboplatin /docetaxel  and pembrolizumab . PET scan in 01/2024 showed abnormal lymph nodes in the left axillary region.  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 Triple negative.  Germline mutation analysis: Negative.  Caris NGS: PD-L1: 10, no other targetable mutations  - Patient was evaluated by Dr. [name] at [facility] for second opinion.  She recommended doing a dose reduced capecitabine  considering poor renal function starting at 500 mg twice daily along with pembrolizumab  as patient has PD-L1 positivity.  Recommended 4-6 cycles, assessment after that and probably 1 year of pembrolizumab .  Treating with a curative intent.  If progressive on this regimen can consider sacituzumab govitecan + pembrolizumab   - I believe this is a reasonable choice considering this is a local recurrence and patient has peripheral neuropathy and cannot get chemotherapy.  Will order capecitabine  at a low dose. - We discussed side effects of capecitabine : Diarrhea, nausea, vomiting, loss of appetite, hand-foot syndrome.  Also discussed side effects of pembrolizumab : Rash, thyroid  irregularities, colitis, pneumonitis and  hepatitis. - Recommended patient to use diclofenac gel and urea  cream for hand and foot - Will repeat PET scan after 3 cycles of pembrolizumab   Return to clinic in 1 month with labs prior to second cycle of pembrolizumab 

## 2024-05-06 NOTE — Progress Notes (Deleted)
Patient is taking Revlimid as prescribed.  She has not missed any doses and reports no side effects at this time.   

## 2024-05-06 NOTE — Patient Instructions (Addendum)
 [facility] Discharge Instructions   You were seen and examined today by Dr. Davonna.  She prescribed for you an antidiarrhea medication called Lomotil . Take as prescribed. You can also take Imodium  as needed with this medication.  We sent another prescription for you to use for nausea. It is called olanzapine  (Zyprexa ). Take every night at bedtime to help prevent nausea and vomiting. Continue to alternate the Zofran  and Compazine  in the daytime as needed for nausea.   We will proceed with your treatment on Thursday.   Return as scheduled.    Thank you for choosing [facility] at [facility] to provide your oncology and hematology care.  To afford each patient quality time with our provider, please arrive at least 15 minutes before your scheduled appointment time.   If you have a lab appointment with the Cancer Center please come in thru the Main Entrance and check in at the main information desk.  You need to re-schedule your appointment should you arrive 10 or more minutes late.  We strive to give you quality time with our providers, and arriving late affects you and other patients whose appointments are after yours.  Also, if you no show three or more times for appointments you may be dismissed from the clinic at the providers discretion.     Again, thank you for choosing [facility].  Our hope is that these requests will decrease the amount of time that you wait before being seen by our physicians.       _____________________________________________________________  Should you have questions after your visit to [facility], please contact our office at [phone] and follow the prompts.  Our office hours are 8:00 a.m. and 4:30 p.m. Monday - Friday.  Please note that voicemails left after 4:00 p.m. may not be returned until the following business day.  We are closed weekends and major holidays.  You do  have access to a nurse 24-7, just call the main number to the clinic [phone] and do not press any options, hold on the line and a nurse will answer the phone.    For prescription refill requests, have your pharmacy contact our office and allow 72 hours.    Due to Covid, you will need to wear a mask upon entering the hospital. If you do not have a mask, a mask will be given to you at the Main Entrance upon arrival. For doctor visits, patients may have 1 support person age 67 or older with them. For treatment visits, patients can not have anyone with them due to social distancing guidelines and our immunocompromised population.

## 2024-05-06 NOTE — Progress Notes (Signed)
 1440 -hydration fluids given today with nausea medication .Patient complained she just got nauseated again. Will give another 4mg  of zofran  per Dr. Davonna prior to leaving.   Patient tolerated it well without problems. Vitals stable and discharged home from clinic via wheelchair. Follow up as scheduled.

## 2024-05-07 DIAGNOSIS — R52 Pain, unspecified: Secondary | ICD-10-CM | POA: Diagnosis not present

## 2024-05-07 DIAGNOSIS — Z992 Dependence on renal dialysis: Secondary | ICD-10-CM | POA: Diagnosis not present

## 2024-05-07 DIAGNOSIS — N2581 Secondary hyperparathyroidism of renal origin: Secondary | ICD-10-CM | POA: Diagnosis not present

## 2024-05-07 DIAGNOSIS — D631 Anemia in chronic kidney disease: Secondary | ICD-10-CM | POA: Diagnosis not present

## 2024-05-07 DIAGNOSIS — D509 Iron deficiency anemia, unspecified: Secondary | ICD-10-CM | POA: Diagnosis not present

## 2024-05-07 DIAGNOSIS — N186 End stage renal disease: Secondary | ICD-10-CM | POA: Diagnosis not present

## 2024-05-08 ENCOUNTER — Inpatient Hospital Stay: Admitting: Oncology

## 2024-05-08 ENCOUNTER — Inpatient Hospital Stay

## 2024-05-08 VITALS — BP 133/69 | HR 79 | Temp 97.7°F | Resp 18

## 2024-05-08 DIAGNOSIS — Z1732 Human epidermal growth factor receptor 2 negative status: Secondary | ICD-10-CM | POA: Diagnosis not present

## 2024-05-08 DIAGNOSIS — C50911 Malignant neoplasm of unspecified site of right female breast: Secondary | ICD-10-CM

## 2024-05-08 DIAGNOSIS — R197 Diarrhea, unspecified: Secondary | ICD-10-CM | POA: Diagnosis not present

## 2024-05-08 DIAGNOSIS — Z171 Estrogen receptor negative status [ER-]: Secondary | ICD-10-CM | POA: Diagnosis not present

## 2024-05-08 DIAGNOSIS — Z5112 Encounter for antineoplastic immunotherapy: Secondary | ICD-10-CM | POA: Diagnosis not present

## 2024-05-08 DIAGNOSIS — Z1722 Progesterone receptor negative status: Secondary | ICD-10-CM | POA: Diagnosis not present

## 2024-05-08 DIAGNOSIS — C50211 Malignant neoplasm of upper-inner quadrant of right female breast: Secondary | ICD-10-CM | POA: Diagnosis not present

## 2024-05-08 DIAGNOSIS — E876 Hypokalemia: Secondary | ICD-10-CM

## 2024-05-08 LAB — CBC WITH DIFFERENTIAL/PLATELET
Abs Immature Granulocytes: 0.01 K/uL (ref 0.00–0.07)
Basophils Absolute: 0.1 K/uL (ref 0.0–0.1)
Basophils Relative: 1 %
Eosinophils Absolute: 0.2 K/uL (ref 0.0–0.5)
Eosinophils Relative: 3 %
HCT: 31.2 % — ABNORMAL LOW (ref 36.0–46.0)
Hemoglobin: 9.6 g/dL — ABNORMAL LOW (ref 12.0–15.0)
Immature Granulocytes: 0 %
Lymphocytes Relative: 14 %
Lymphs Abs: 1 K/uL (ref 0.7–4.0)
MCH: 34.9 pg — ABNORMAL HIGH (ref 26.0–34.0)
MCHC: 30.8 g/dL (ref 30.0–36.0)
MCV: 113.5 fL — ABNORMAL HIGH (ref 80.0–100.0)
Monocytes Absolute: 0.9 K/uL (ref 0.1–1.0)
Monocytes Relative: 14 %
Neutro Abs: 4.8 K/uL (ref 1.7–7.7)
Neutrophils Relative %: 68 %
Platelets: 257 K/uL (ref 150–400)
RBC: 2.75 MIL/uL — ABNORMAL LOW (ref 3.87–5.11)
RDW: 17.3 % — ABNORMAL HIGH (ref 11.5–15.5)
Smear Review: NORMAL
WBC: 6.9 K/uL (ref 4.0–10.5)
nRBC: 0 % (ref 0.0–0.2)

## 2024-05-08 LAB — COMPREHENSIVE METABOLIC PANEL WITH GFR
ALT: 13 U/L (ref 0–44)
AST: 24 U/L (ref 15–41)
Albumin: 3 g/dL — ABNORMAL LOW (ref 3.5–5.0)
Alkaline Phosphatase: 73 U/L (ref 38–126)
Anion gap: 17 — ABNORMAL HIGH (ref 5–15)
BUN: 27 mg/dL — ABNORMAL HIGH (ref 8–23)
CO2: 26 mmol/L (ref 22–32)
Calcium: 8.5 mg/dL — ABNORMAL LOW (ref 8.9–10.3)
Chloride: 96 mmol/L — ABNORMAL LOW (ref 98–111)
Creatinine, Ser: 8.94 mg/dL — ABNORMAL HIGH (ref 0.44–1.00)
GFR, Estimated: 5 mL/min — ABNORMAL LOW (ref >60)
Glucose, Bld: 124 mg/dL — ABNORMAL HIGH (ref 70–99)
Potassium: 3.3 mmol/L — ABNORMAL LOW (ref 3.5–5.1)
Sodium: 139 mmol/L (ref 135–145)
Total Bilirubin: 0.8 mg/dL (ref 0.0–1.2)
Total Protein: 6.8 g/dL (ref 6.5–8.1)

## 2024-05-08 LAB — MAGNESIUM: Magnesium: 2.2 mg/dL (ref 1.7–2.4)

## 2024-05-08 MED ORDER — SODIUM CHLORIDE 0.9 % IV SOLN: INTRAVENOUS | Status: DC

## 2024-05-08 MED ORDER — POTASSIUM CHLORIDE CRYS ER 20 MEQ PO TBCR
40.0000 meq | EXTENDED_RELEASE_TABLET | Freq: Once | ORAL | Status: AC
  Administered 2024-05-08: 40 meq via ORAL
  Filled 2024-05-08: qty 2

## 2024-05-08 MED ORDER — SODIUM CHLORIDE 0.9 % IV SOLN
200.0000 mg | Freq: Once | INTRAVENOUS | Status: AC
  Administered 2024-05-08: 200 mg via INTRAVENOUS
  Filled 2024-05-08: qty 8

## 2024-05-08 NOTE — Progress Notes (Signed)
 Remove creatinine parameters from treatment conditions in plan.  Patient is on dialysis Monday-Wednesday-Friday.  V.O. Dr Ivery Molt, PharmD

## 2024-05-08 NOTE — Progress Notes (Signed)
 Potassium 3.3 40 mEq of potassium chloride  given per standing orders. Patient tolerated therapy with no complaints voiced. Side effects with management reviewed with understanding verbalized. Port site clean and dry with no bruising or swelling noted at site. Good blood return noted before and after administration of therapy. Band aid applied. Patient left in satisfactory condition with VSS and no s/s of distress noted.

## 2024-05-08 NOTE — Patient Instructions (Signed)
 [facility] [place] - A DEPT OF [facility]  Discharge Instructions: Thank you for choosing [facility] to provide your oncology and hematology care.  If you have a lab appointment with the Cancer Center - please note that after April 8th, 2024, all labs will be drawn in the cancer center.  You do not have to check in or register with the main entrance as you have in the past but will complete your check-in in the cancer center.  Wear comfortable clothing and clothing appropriate for easy access to any Portacath or PICC line.   We strive to give you quality time with your provider. You may need to reschedule your appointment if you arrive late (15 or more minutes).  Arriving late affects you and other patients whose appointments are after yours.  Also, if you miss three or more appointments without notifying the office, you may be dismissed from the clinic at the provider's discretion.      For prescription refill requests, have your pharmacy contact our office and allow 72 hours for refills to be completed.    Today you received the following chemotherapy and/or immunotherapy agents Keytruda  and potassium chloride .   To help prevent nausea and vomiting after your treatment, we encourage you to take your nausea medication as directed.  BELOW ARE SYMPTOMS THAT SHOULD BE REPORTED IMMEDIATELY: *FEVER GREATER THAN 100.4 F (38 C) OR HIGHER *CHILLS OR SWEATING *NAUSEA AND VOMITING THAT IS NOT CONTROLLED WITH YOUR NAUSEA MEDICATION *UNUSUAL SHORTNESS OF BREATH *UNUSUAL BRUISING OR BLEEDING *URINARY PROBLEMS (pain or burning when urinating, or frequent urination) *BOWEL PROBLEMS (unusual diarrhea, constipation, pain near the anus) TENDERNESS IN MOUTH AND THROAT WITH OR WITHOUT PRESENCE OF ULCERS (sore throat, sores in mouth, or a toothache) UNUSUAL RASH, SWELLING OR PAIN  UNUSUAL VAGINAL DISCHARGE OR ITCHING   Items with * indicate a potential emergency and  should be followed up as soon as possible or go to the Emergency Department if any problems should occur.  Please show the CHEMOTHERAPY ALERT CARD or IMMUNOTHERAPY ALERT CARD at check-in to the Emergency Department and triage nurse.  Should you have questions after your visit or need to cancel or reschedule your appointment, please contact [facility] [place] - A DEPT OF [name] [phone]  and follow the prompts.  Office hours are 8:00 a.m. to 4:30 p.m. Monday - Friday. Please note that voicemails left after 4:00 p.m. may not be returned until the following business day.  We are closed weekends and major holidays. You have access to a nurse at all times for urgent questions. Please call the main number to the clinic [phone] and follow the prompts.  For any non-urgent questions, you may also contact your provider using MyChart. We now offer e-Visits for anyone 20 and older to request care online for non-urgent symptoms. For details visit [email or web address].   Also download the MyChart app! Go to the app store, search MyChart, open the app, select [facility], and log in with your MyChart username and password.

## 2024-05-09 DIAGNOSIS — R52 Pain, unspecified: Secondary | ICD-10-CM | POA: Diagnosis not present

## 2024-05-09 DIAGNOSIS — N186 End stage renal disease: Secondary | ICD-10-CM | POA: Diagnosis not present

## 2024-05-09 DIAGNOSIS — N2581 Secondary hyperparathyroidism of renal origin: Secondary | ICD-10-CM | POA: Diagnosis not present

## 2024-05-09 DIAGNOSIS — D509 Iron deficiency anemia, unspecified: Secondary | ICD-10-CM | POA: Diagnosis not present

## 2024-05-09 DIAGNOSIS — D631 Anemia in chronic kidney disease: Secondary | ICD-10-CM | POA: Diagnosis not present

## 2024-05-09 DIAGNOSIS — Z992 Dependence on renal dialysis: Secondary | ICD-10-CM | POA: Diagnosis not present

## 2024-05-12 ENCOUNTER — Emergency Department (HOSPITAL_COMMUNITY)

## 2024-05-12 ENCOUNTER — Other Ambulatory Visit: Payer: Self-pay

## 2024-05-12 ENCOUNTER — Inpatient Hospital Stay (HOSPITAL_COMMUNITY)
Admission: EM | Admit: 2024-05-12 | Discharge: 2024-05-15 | DRG: 189 | Disposition: A | Discharge status: Home / Self Care | Attending: Family Medicine | Admitting: Family Medicine

## 2024-05-12 ENCOUNTER — Observation Stay (HOSPITAL_COMMUNITY)

## 2024-05-12 ENCOUNTER — Encounter (HOSPITAL_COMMUNITY): Payer: Self-pay | Admitting: Emergency Medicine

## 2024-05-12 DIAGNOSIS — Z6832 Body mass index (BMI) 32.0-32.9, adult: Secondary | ICD-10-CM | POA: Diagnosis not present

## 2024-05-12 DIAGNOSIS — D8481 Immunodeficiency due to conditions classified elsewhere: Secondary | ICD-10-CM | POA: Diagnosis not present

## 2024-05-12 DIAGNOSIS — Z723 Lack of physical exercise: Secondary | ICD-10-CM

## 2024-05-12 DIAGNOSIS — Z800 Family history of malignant neoplasm of digestive organs: Secondary | ICD-10-CM

## 2024-05-12 DIAGNOSIS — N186 End stage renal disease: Secondary | ICD-10-CM | POA: Diagnosis not present

## 2024-05-12 DIAGNOSIS — E669 Obesity, unspecified: Secondary | ICD-10-CM | POA: Diagnosis present

## 2024-05-12 DIAGNOSIS — N040 Nephrotic syndrome with minor glomerular abnormality: Secondary | ICD-10-CM | POA: Diagnosis not present

## 2024-05-12 DIAGNOSIS — Z9981 Dependence on supplemental oxygen: Secondary | ICD-10-CM

## 2024-05-12 DIAGNOSIS — Z9013 Acquired absence of bilateral breasts and nipples: Secondary | ICD-10-CM

## 2024-05-12 DIAGNOSIS — Z992 Dependence on renal dialysis: Secondary | ICD-10-CM

## 2024-05-12 DIAGNOSIS — Z8249 Family history of ischemic heart disease and other diseases of the circulatory system: Secondary | ICD-10-CM

## 2024-05-12 DIAGNOSIS — I132 Hypertensive heart and chronic kidney disease with heart failure and with stage 5 chronic kidney disease, or end stage renal disease: Secondary | ICD-10-CM | POA: Diagnosis present

## 2024-05-12 DIAGNOSIS — J9601 Acute respiratory failure with hypoxia: Principal | ICD-10-CM | POA: Diagnosis present

## 2024-05-12 DIAGNOSIS — D631 Anemia in chronic kidney disease: Secondary | ICD-10-CM | POA: Diagnosis not present

## 2024-05-12 DIAGNOSIS — N2581 Secondary hyperparathyroidism of renal origin: Secondary | ICD-10-CM | POA: Diagnosis not present

## 2024-05-12 DIAGNOSIS — I2510 Atherosclerotic heart disease of native coronary artery without angina pectoris: Secondary | ICD-10-CM | POA: Diagnosis not present

## 2024-05-12 DIAGNOSIS — K219 Gastro-esophageal reflux disease without esophagitis: Secondary | ICD-10-CM | POA: Diagnosis not present

## 2024-05-12 DIAGNOSIS — I10 Essential (primary) hypertension: Secondary | ICD-10-CM | POA: Diagnosis present

## 2024-05-12 DIAGNOSIS — C787 Secondary malignant neoplasm of liver and intrahepatic bile duct: Secondary | ICD-10-CM | POA: Diagnosis not present

## 2024-05-12 DIAGNOSIS — T451X5A Adverse effect of antineoplastic and immunosuppressive drugs, initial encounter: Secondary | ICD-10-CM | POA: Diagnosis not present

## 2024-05-12 DIAGNOSIS — Z9049 Acquired absence of other specified parts of digestive tract: Secondary | ICD-10-CM

## 2024-05-12 DIAGNOSIS — K521 Toxic gastroenteritis and colitis: Secondary | ICD-10-CM | POA: Diagnosis not present

## 2024-05-12 DIAGNOSIS — E8770 Fluid overload, unspecified: Secondary | ICD-10-CM | POA: Diagnosis present

## 2024-05-12 DIAGNOSIS — I5031 Acute diastolic (congestive) heart failure: Secondary | ICD-10-CM | POA: Diagnosis present

## 2024-05-12 DIAGNOSIS — Z886 Allergy status to analgesic agent status: Secondary | ICD-10-CM

## 2024-05-12 DIAGNOSIS — C7800 Secondary malignant neoplasm of unspecified lung: Secondary | ICD-10-CM | POA: Diagnosis present

## 2024-05-12 DIAGNOSIS — J90 Pleural effusion, not elsewhere classified: Secondary | ICD-10-CM

## 2024-05-12 DIAGNOSIS — E66811 Obesity, class 1: Secondary | ICD-10-CM | POA: Diagnosis present

## 2024-05-12 DIAGNOSIS — J188 Other pneumonia, unspecified organism: Secondary | ICD-10-CM

## 2024-05-12 DIAGNOSIS — Z17421 Hormone receptor negative with human epidermal growth factor receptor 2 negative status: Secondary | ICD-10-CM

## 2024-05-12 DIAGNOSIS — C50919 Malignant neoplasm of unspecified site of unspecified female breast: Secondary | ICD-10-CM | POA: Diagnosis not present

## 2024-05-12 DIAGNOSIS — Z90710 Acquired absence of both cervix and uterus: Secondary | ICD-10-CM

## 2024-05-12 DIAGNOSIS — Z853 Personal history of malignant neoplasm of breast: Secondary | ICD-10-CM

## 2024-05-12 DIAGNOSIS — C773 Secondary and unspecified malignant neoplasm of axilla and upper limb lymph nodes: Secondary | ICD-10-CM | POA: Diagnosis not present

## 2024-05-12 DIAGNOSIS — E1122 Type 2 diabetes mellitus with diabetic chronic kidney disease: Secondary | ICD-10-CM | POA: Diagnosis present

## 2024-05-12 DIAGNOSIS — I120 Hypertensive chronic kidney disease with stage 5 chronic kidney disease or end stage renal disease: Secondary | ICD-10-CM | POA: Diagnosis not present

## 2024-05-12 DIAGNOSIS — I5033 Acute on chronic diastolic (congestive) heart failure: Secondary | ICD-10-CM | POA: Diagnosis not present

## 2024-05-12 DIAGNOSIS — Z79899 Other long term (current) drug therapy: Secondary | ICD-10-CM

## 2024-05-12 DIAGNOSIS — R918 Other nonspecific abnormal finding of lung field: Secondary | ICD-10-CM | POA: Diagnosis not present

## 2024-05-12 DIAGNOSIS — R140 Abdominal distension (gaseous): Secondary | ICD-10-CM | POA: Diagnosis not present

## 2024-05-12 DIAGNOSIS — E782 Mixed hyperlipidemia: Secondary | ICD-10-CM | POA: Diagnosis present

## 2024-05-12 DIAGNOSIS — Z833 Family history of diabetes mellitus: Secondary | ICD-10-CM

## 2024-05-12 DIAGNOSIS — J189 Pneumonia, unspecified organism: Secondary | ICD-10-CM | POA: Diagnosis not present

## 2024-05-12 DIAGNOSIS — Z7982 Long term (current) use of aspirin: Secondary | ICD-10-CM

## 2024-05-12 DIAGNOSIS — R0602 Shortness of breath: Secondary | ICD-10-CM | POA: Diagnosis not present

## 2024-05-12 LAB — CBC WITH DIFFERENTIAL/PLATELET
Abs Immature Granulocytes: 0.04 K/uL (ref 0.00–0.07)
Basophils Absolute: 0.1 K/uL (ref 0.0–0.1)
Basophils Relative: 1 %
Eosinophils Absolute: 0.3 K/uL (ref 0.0–0.5)
Eosinophils Relative: 3 %
HCT: 32.6 % — ABNORMAL LOW (ref 36.0–46.0)
Hemoglobin: 10.2 g/dL — ABNORMAL LOW (ref 12.0–15.0)
Immature Granulocytes: 0 %
Lymphocytes Relative: 13 %
Lymphs Abs: 1.1 K/uL (ref 0.7–4.0)
MCH: 34.9 pg — ABNORMAL HIGH (ref 26.0–34.0)
MCHC: 31.3 g/dL (ref 30.0–36.0)
MCV: 111.6 fL — ABNORMAL HIGH (ref 80.0–100.0)
Monocytes Absolute: 0.8 K/uL (ref 0.1–1.0)
Monocytes Relative: 8 %
Neutro Abs: 6.8 K/uL (ref 1.7–7.7)
Neutrophils Relative %: 75 %
Platelets: 274 K/uL (ref 150–400)
RBC: 2.92 MIL/uL — ABNORMAL LOW (ref 3.87–5.11)
RDW: 16.8 % — ABNORMAL HIGH (ref 11.5–15.5)
WBC: 9.1 K/uL (ref 4.0–10.5)
nRBC: 0 % (ref 0.0–0.2)

## 2024-05-12 LAB — COMPREHENSIVE METABOLIC PANEL WITH GFR
ALT: 16 U/L (ref 0–44)
AST: 22 U/L (ref 15–41)
Albumin: 2.9 g/dL — ABNORMAL LOW (ref 3.5–5.0)
Alkaline Phosphatase: 91 U/L (ref 38–126)
Anion gap: 17 — ABNORMAL HIGH (ref 5–15)
BUN: 47 mg/dL — ABNORMAL HIGH (ref 8–23)
CO2: 26 mmol/L (ref 22–32)
Calcium: 8 mg/dL — ABNORMAL LOW (ref 8.9–10.3)
Chloride: 98 mmol/L (ref 98–111)
Creatinine, Ser: 12.06 mg/dL — ABNORMAL HIGH (ref 0.44–1.00)
GFR, Estimated: 3 mL/min — ABNORMAL LOW (ref >60)
Glucose, Bld: 106 mg/dL — ABNORMAL HIGH (ref 70–99)
Potassium: 3.9 mmol/L (ref 3.5–5.1)
Sodium: 141 mmol/L (ref 135–145)
Total Bilirubin: 0.7 mg/dL (ref 0.0–1.2)
Total Protein: 6.8 g/dL (ref 6.5–8.1)

## 2024-05-12 LAB — RESP PANEL BY RT-PCR (RSV, FLU A&B, COVID)  RVPGX2
Influenza A by PCR: NEGATIVE
Influenza B by PCR: NEGATIVE
Resp Syncytial Virus by PCR: NEGATIVE
SARS Coronavirus 2 by RT PCR: NEGATIVE

## 2024-05-12 LAB — HEPATITIS B SURFACE ANTIGEN: Hepatitis B Surface Ag: NONREACTIVE

## 2024-05-12 LAB — LACTIC ACID, PLASMA: Lactic Acid, Venous: 1.4 mmol/L (ref 0.5–1.9)

## 2024-05-12 LAB — PROCALCITONIN: Procalcitonin: 0.9 ng/mL

## 2024-05-12 LAB — HIV ANTIBODY (ROUTINE TESTING W REFLEX): HIV Screen 4th Generation wRfx: NONREACTIVE

## 2024-05-12 LAB — BRAIN NATRIURETIC PEPTIDE: B Natriuretic Peptide: 621 pg/mL — ABNORMAL HIGH (ref 0.0–100.0)

## 2024-05-12 MED ORDER — CHLORHEXIDINE GLUCONATE CLOTH 2 % EX PADS
6.0000 | MEDICATED_PAD | Freq: Every day | CUTANEOUS | Status: DC
  Administered 2024-05-12 – 2024-05-15 (×4): 6 via TOPICAL

## 2024-05-12 MED ORDER — HEPARIN SODIUM (PORCINE) 1000 UNIT/ML DIALYSIS: 1000.0000 [IU] | INTRAMUSCULAR | Status: DC | PRN

## 2024-05-12 MED ORDER — AZITHROMYCIN 500 MG IV SOLR: 500.0000 mg | Freq: Once | INTRAVENOUS | Status: DC

## 2024-05-12 MED ORDER — ASPIRIN 81 MG PO TBEC
81.0000 mg | DELAYED_RELEASE_TABLET | Freq: Every day | ORAL | Status: DC
  Administered 2024-05-12 – 2024-05-15 (×4): 81 mg via ORAL
  Filled 2024-05-12 (×4): qty 1

## 2024-05-12 MED ORDER — GUAIFENESIN-DM 100-10 MG/5ML PO SYRP
10.0000 mL | ORAL_SOLUTION | ORAL | Status: DC | PRN
  Administered 2024-05-12 – 2024-05-15 (×10): 10 mL via ORAL
  Filled 2024-05-12 (×10): qty 10

## 2024-05-12 MED ORDER — IOHEXOL 300 MG/ML  SOLN
75.0000 mL | Freq: Once | INTRAMUSCULAR | Status: AC | PRN
  Administered 2024-05-12: 75 mL via INTRAVENOUS

## 2024-05-12 MED ORDER — OLANZAPINE 5 MG PO TABS
5.0000 mg | ORAL_TABLET | Freq: Every day | ORAL | Status: DC
Start: 2024-05-12 — End: 2024-05-15
  Administered 2024-05-12 – 2024-05-14 (×3): 5 mg via ORAL
  Filled 2024-05-12 (×3): qty 1

## 2024-05-12 MED ORDER — MENTHOL 3 MG MT LOZG
1.0000 | LOZENGE | OROMUCOSAL | Status: DC | PRN
  Filled 2024-05-12: qty 9

## 2024-05-12 MED ORDER — SODIUM CHLORIDE 0.9 % IV SOLN: 1.0000 g | Freq: Once | INTRAVENOUS | Status: DC

## 2024-05-12 MED ORDER — HEPARIN SODIUM (PORCINE) 5000 UNIT/ML IJ SOLN
5000.0000 [IU] | Freq: Three times a day (TID) | INTRAMUSCULAR | Status: DC
  Filled 2024-05-12 (×3): qty 1

## 2024-05-12 MED ORDER — IPRATROPIUM-ALBUTEROL 0.5-2.5 (3) MG/3ML IN SOLN
3.0000 mL | Freq: Once | RESPIRATORY_TRACT | Status: AC
  Administered 2024-05-12: 3 mL via RESPIRATORY_TRACT
  Filled 2024-05-12: qty 3

## 2024-05-12 MED ORDER — HEPARIN SODIUM (PORCINE) 5000 UNIT/ML IJ SOLN: 5000.0000 [IU] | Freq: Three times a day (TID) | INTRAMUSCULAR | Status: DC

## 2024-05-12 MED ORDER — LIDOCAINE HCL (PF) 1 % IJ SOLN: 5.0000 mL | INTRAMUSCULAR | Status: DC | PRN

## 2024-05-12 MED ORDER — ONDANSETRON HCL 4 MG PO TABS: 4.0000 mg | ORAL_TABLET | Freq: Four times a day (QID) | ORAL | Status: DC | PRN

## 2024-05-12 MED ORDER — ANTICOAGULANT SODIUM CITRATE 4% (200MG/5ML) IV SOLN: 5.0000 mL | Status: DC | PRN

## 2024-05-12 MED ORDER — PANTOPRAZOLE SODIUM 40 MG PO TBEC
40.0000 mg | DELAYED_RELEASE_TABLET | Freq: Two times a day (BID) | ORAL | Status: DC
  Administered 2024-05-12 – 2024-05-15 (×5): 40 mg via ORAL
  Filled 2024-05-12 (×6): qty 1

## 2024-05-12 MED ORDER — ALTEPLASE 2 MG IJ SOLR: 2.0000 mg | Freq: Once | INTRAMUSCULAR | Status: DC | PRN

## 2024-05-12 MED ORDER — GABAPENTIN 300 MG PO CAPS
300.0000 mg | ORAL_CAPSULE | Freq: Every day | ORAL | Status: DC
Start: 2024-05-12 — End: 2024-05-15
  Administered 2024-05-12 – 2024-05-14 (×3): 300 mg via ORAL
  Filled 2024-05-12 (×3): qty 1

## 2024-05-12 MED ORDER — LIDOCAINE-PRILOCAINE 2.5-2.5 % EX CREA: 1.0000 | TOPICAL_CREAM | CUTANEOUS | Status: DC | PRN

## 2024-05-12 MED ORDER — CAPECITABINE 500 MG PO TABS
500.0000 mg | ORAL_TABLET | Freq: Two times a day (BID) | ORAL | Status: DC
  Administered 2024-05-12 – 2024-05-15 (×6): 500 mg via ORAL

## 2024-05-12 MED ORDER — CINACALCET HCL 30 MG PO TABS
90.0000 mg | ORAL_TABLET | Freq: Every day | ORAL | Status: DC
  Administered 2024-05-12 – 2024-05-15 (×4): 90 mg via ORAL
  Filled 2024-05-12 (×4): qty 3

## 2024-05-12 MED ORDER — ATORVASTATIN CALCIUM 20 MG PO TABS
20.0000 mg | ORAL_TABLET | Freq: Every day | ORAL | Status: DC
  Administered 2024-05-12 – 2024-05-15 (×4): 20 mg via ORAL
  Filled 2024-05-12 (×4): qty 1

## 2024-05-12 MED ORDER — NEBIVOLOL HCL 10 MG PO TABS
10.0000 mg | ORAL_TABLET | Freq: Two times a day (BID) | ORAL | Status: DC
Start: 2024-05-12 — End: 2024-05-15
  Administered 2024-05-12 – 2024-05-15 (×6): 10 mg via ORAL
  Filled 2024-05-12 (×6): qty 1

## 2024-05-12 MED ORDER — ONDANSETRON HCL 4 MG/2ML IJ SOLN
4.0000 mg | Freq: Four times a day (QID) | INTRAMUSCULAR | Status: DC | PRN
  Administered 2024-05-12 – 2024-05-14 (×2): 4 mg via INTRAVENOUS
  Filled 2024-05-12: qty 2

## 2024-05-12 MED ORDER — CALCITRIOL 0.25 MCG PO CAPS
1.5000 ug | ORAL_CAPSULE | ORAL | Status: DC
Start: 2024-05-12 — End: 2024-05-15
  Administered 2024-05-14: 1.5 ug via ORAL
  Filled 2024-05-12: qty 6

## 2024-05-12 MED ORDER — ISOSORBIDE MONONITRATE ER 30 MG PO TB24
15.0000 mg | ORAL_TABLET | Freq: Every day | ORAL | Status: DC
  Administered 2024-05-13 – 2024-05-15 (×3): 15 mg via ORAL
  Filled 2024-05-12 (×3): qty 1

## 2024-05-12 MED ORDER — NITROGLYCERIN 0.4 MG SL SUBL: 0.4000 mg | SUBLINGUAL_TABLET | SUBLINGUAL | Status: DC | PRN

## 2024-05-12 MED ORDER — PENTAFLUOROPROP-TETRAFLUOROETH EX AERO: 1.0000 | INHALATION_SPRAY | CUTANEOUS | Status: DC | PRN

## 2024-05-12 MED ORDER — SODIUM CHLORIDE 0.9 % IV SOLN
1.0000 g | INTRAVENOUS | Status: DC
  Administered 2024-05-12 – 2024-05-15 (×4): 1 g via INTRAVENOUS
  Filled 2024-05-12 (×4): qty 10

## 2024-05-12 MED ORDER — IPRATROPIUM-ALBUTEROL 0.5-2.5 (3) MG/3ML IN SOLN
3.0000 mL | RESPIRATORY_TRACT | Status: DC | PRN
  Administered 2024-05-14: 3 mL via RESPIRATORY_TRACT
  Filled 2024-05-12: qty 3

## 2024-05-12 MED ORDER — ACETAMINOPHEN 650 MG RE SUPP: 650.0000 mg | Freq: Four times a day (QID) | RECTAL | Status: DC | PRN

## 2024-05-12 MED ORDER — ONDANSETRON HCL 4 MG/2ML IJ SOLN
INTRAMUSCULAR | Status: AC
  Filled 2024-05-12: qty 2

## 2024-05-12 MED ORDER — AMLODIPINE BESYLATE 5 MG PO TABS
5.0000 mg | ORAL_TABLET | Freq: Every day | ORAL | Status: DC
  Administered 2024-05-12 – 2024-05-14 (×3): 5 mg via ORAL
  Filled 2024-05-12 (×3): qty 1

## 2024-05-12 MED ORDER — DIPHENOXYLATE-ATROPINE 2.5-0.025 MG PO TABS
1.0000 | ORAL_TABLET | Freq: Four times a day (QID) | ORAL | Status: DC | PRN
Start: 2024-05-12 — End: 2024-05-15
  Administered 2024-05-12 – 2024-05-15 (×5): 1 via ORAL
  Filled 2024-05-12 (×5): qty 1

## 2024-05-12 MED ORDER — ACETAMINOPHEN 325 MG PO TABS
650.0000 mg | ORAL_TABLET | Freq: Four times a day (QID) | ORAL | Status: DC | PRN
  Administered 2024-05-12 – 2024-05-15 (×3): 650 mg via ORAL
  Filled 2024-05-12 (×3): qty 2

## 2024-05-12 NOTE — Progress Notes (Addendum)
 Pt receives out-pt HD at [facility], MWF, 0530am chair time. Contacted clinic to inform of pt arrival. Will continue to assist as needed.   Eddi Hymes Dialysis Navigator [phone] [facility] [phone]

## 2024-05-12 NOTE — Consult Note (Signed)
 ESRD Consult Note  Requesting provider: Dr. Maree  Reason for consult: ESRD, provision of dialysis  Assessment/Recommendations:  ESRD  -outpatient HD orders: [facility]. MWF.  3 hours 15 minutes.  EDW 79.9 kg.  2K/2.5 calcium .  aVF, 15G.  Flow rates: 400/500.  Heparin : None.  Meds: Aranesp  160 mcg once weekly (last dose 9/26), Venofer  50 mg once weekly, calcitriol  1.5 mcg every treatment -HD today per her MWF schedule  SOB -secondary to metastatic disease +/- pneumonia -mgmt+abx per primary service--currently on rocephin  and azithro -CT chest with contrast pending  Volume/ hypertension  -EDW 79.7kg. UF as tolerated  Anemia of Chronic Kidney Disease -Hemoglobin 10.2. Hold ESA here in the context of active malignancy.  -Transfuse PRN for Hgb <7  Secondary Hyperparathyroidism/Hyperphosphatemia - resume home meds, monitor phos, will resume calcitriol  with HD   Vascular access Chronic LUE swelling -s/p recent fistulogram on 9/3 for chronic left arm swelling: found to have widely patent system, swelling likely related to lymph node mets  # Additional recommendations: - Dose all meds for creatinine clearance < 10 ml/min  - Unless absolutely necessary, no MRIs with gadolinium.  - Implement save arm precautions.  Prefer needle sticks in the dorsum of the hands or wrists.  No blood pressure measurements in arm. - If blood transfusion is requested during hemodialysis sessions, please alert us  prior to the session.  - If a hemodialysis catheter line culture is requested, please alert us  as only hemodialysis nurses are able to collect those specimens.   Recommendations were discussed with the primary team.  Ephriam Stank, MD [facility]  History of Present Illness: Megan [name] is a/an 63 y.o. female with a past medical history of ESRD, metastatic breast Ca, CAD, HFpEF, HTN, obesity, DM2, HLD who presents with SOB. Presented to HD today but sent to ER  given SOB, cough. Has been having these symptoms for 3 days. Chest x-ray in the ER showed severe progression of pulmonary metastatic disease as compared to her last x-ray on 8/21 with some superimposed airspace opacity and probable small left pleural effusions. Patient seen and examined bedside. Husband at bedside. They report that she needs to take Xeloda  twice a day with meals which they don't have ordered. Biggest complaint is cough which is causing her to have pain. She is actively receiving chemo, last dose was last Thursday. No other complaints.   Medications:  Current Facility-Administered Medications  Medication Dose Route Frequency Provider Last Rate Last Admin   acetaminophen  (TYLENOL ) tablet 650 mg  650 mg Oral Q6H PRN Arrien, Elidia Sieving, MD       Or   acetaminophen  (TYLENOL ) suppository 650 mg  650 mg Rectal Q6H PRN Arrien, Elidia Sieving, MD       amLODipine  (NORVASC ) tablet 5 mg  5 mg Oral QHS Arrien, Elidia Sieving, MD       aspirin  EC tablet 81 mg  81 mg Oral Daily Arrien, Elidia Sieving, MD       atorvastatin  (LIPITOR) tablet 20 mg  20 mg Oral Daily Arrien, Elidia Sieving, MD       azithromycin  (ZITHROMAX ) 500 mg in sodium chloride  0.9 % 250 mL IVPB  500 mg Intravenous Once Wickline, Donald, MD       cefTRIAXone  (ROCEPHIN ) 1 g in sodium chloride  0.9 % 100 mL IVPB  1 g Intravenous Once Midge Golas, MD       cinacalcet  (SENSIPAR ) tablet 90 mg  90 mg Oral Daily Arrien, Elidia Sieving,  MD       gabapentin  (NEURONTIN ) capsule 300 mg  300 mg Oral QHS Arrien, Elidia Sieving, MD       heparin  injection 5,000 Units  5,000 Units Subcutaneous Q8H Arrien, Elidia Sieving, MD       ipratropium-albuterol  (DUONEB) 0.5-2.5 (3) MG/3ML nebulizer solution 3 mL  3 mL Nebulization Once Wickline, Donald, MD       isosorbide  mononitrate (IMDUR ) 24 hr tablet 15 mg  15 mg Oral Daily Arrien, Elidia Sieving, MD       nebivolol  (BYSTOLIC ) tablet 10 mg  10 mg Oral BID Arrien, Elidia Sieving, MD       nitroGLYCERIN  (NITROSTAT ) SL tablet 0.4 mg  0.4 mg Sublingual Q5 Min x 3 PRN Arrien, Mauricio Daniel, MD       OLANZapine  (ZYPREXA ) tablet 5 mg  5 mg Oral QHS Arrien, Mauricio Daniel, MD       ondansetron  (ZOFRAN ) tablet 4 mg  4 mg Oral Q6H PRN Arrien, Mauricio Daniel, MD       Or   ondansetron  (ZOFRAN ) injection 4 mg  4 mg Intravenous Q6H PRN Arrien, Mauricio Daniel, MD       pantoprazole  (PROTONIX ) EC tablet 40 mg  40 mg Oral BID AC Arrien, Elidia Sieving, MD         ALLERGIES Motrin [ibuprofen]  MEDICAL HISTORY Past Medical History:  Diagnosis Date   (HFpEF) heart failure with preserved ejection fraction (HCC)    a. 01/2019 Echo: EF 55-60%, mild conc LVH. DD.  Torn MV chordae.   Anemia    Atypical chest pain    a. 08/2018 MV: EF 59%, no ischemia; b. 02/2019 Cath: nonobs dzs.   Blood transfusion without reported diagnosis    Breast cancer (HCC) 10/12/2020   Cataract    ESRD (end stage renal disease) on dialysis [facility])    a. HD T, T, S   Essential hypertension, benign    GERD (gastroesophageal reflux disease)    Headache    Hemorrhoids    Mixed hyperlipidemia    Morbid obesity (HCC)    Non-obstructive CAD (coronary artery disease)    a. 02/2019 CathL LM nl, LAD 12m, LCX nl, RCA 25p, EF 55-65%.   PONV (postoperative nausea and vomiting)    Renal insufficiency    S/P colonoscopy 08/2009   Dr. Rollin: sessile polyp (benign lymphoid), large hemorrhoids, repeat 5-10 years   Temporal arteritis (HCC)    Type 2 diabetes mellitus (HCC)    Wears glasses      SOCIAL HISTORY Social History   Socioeconomic History   Marital status: Married    Spouse name: Not on file   Number of children: Not on file   Years of education: Not on file   Highest education level: 12th grade  Occupational History   Occupation: Systems developer: [employer]  Tobacco Use   Smoking status: Never    Passive exposure: Never   Smokeless tobacco: Never  Vaping Use   Vaping  status: Never Used  Substance and Sexual Activity   Alcohol use: No   Drug use: No   Sexual activity: Yes    Birth control/protection: Surgical  Other Topics Concern   Not on file  Social History Narrative   Works at [employer] in [place].    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   Also has to get items from high shelves-causes achy pain in shoulder area  Married.   Children are grown, out of house.   Social Drivers of Corporate investment banker Strain: Low Risk  (09/20/2023)   Overall Financial Resource Strain (CARDIA)    Difficulty of Paying Living Expenses: Not hard at all  Food Insecurity: No Food Insecurity (05/12/2024)   Hunger Vital Sign    Worried About Running Out of Food in the Last Year: Never true    Ran Out of Food in the Last Year: Never true  Transportation Needs: No Transportation Needs (05/12/2024)   PRAPARE - Administrator, Civil Service (Medical): No    Lack of Transportation (Non-Medical): No  Physical Activity: Inactive (09/20/2023)   Exercise Vital Sign    Days of Exercise per Week: 0 days    Minutes of Exercise per Session: 0 min  Stress: No Stress Concern Present (09/20/2023)   [employer] of Occupational Health - Occupational Stress Questionnaire    Feeling of Stress : Not at all  Social Connections: Socially Integrated (05/12/2024)   Social Connection and Isolation Panel    Frequency of Communication with Friends and Family: More than three times a week    Frequency of Social Gatherings with Friends and Family: More than three times a week    Attends Religious Services: More than 4 times per year    Active Member of [employer] or Organizations: Yes    Attends Engineer, structural: More than 4 times per year    Marital Status: Married  Catering manager Violence: Not At Risk (05/12/2024)   Humiliation, Afraid, Rape, and Kick questionnaire    Fear of Current or Ex-Partner: No    Emotionally Abused: No    Physically  Abused: No    Sexually Abused: No     FAMILY HISTORY Family History  Problem Relation Age of Onset   Hypertension Mother    Coronary artery disease Mother    Diabetes Mother    Heart attack Father    Hypertension Sister    Coronary artery disease Sister    Hypertension Brother    Colon cancer Brother    Heart attack Maternal Grandmother    Heart attack Maternal Grandfather    Heart attack Paternal Grandmother    Heart attack Paternal Grandfather    Hypertension Son    Heart attack Maternal Aunt    Hypertension Maternal Aunt    Diabetes Maternal Aunt    Heart attack Maternal Uncle    Hypertension Maternal Uncle    Diabetes Maternal Uncle    Heart attack Paternal Aunt    Hypertension Paternal Aunt    Diabetes Paternal Aunt    Heart attack Paternal Uncle    Hypertension Paternal Uncle    Diabetes Paternal Uncle      Review of Systems: 12 systems were reviewed and negative except per HPI  Physical Exam: Vitals:   05/12/24 0700 05/12/24 0742  BP: (!) 161/91 (!) 162/80  Pulse: 74 77  Resp: (!) 25   Temp:  97.8 F (36.6 C)  SpO2: 94% 91%   No intake/output data recorded. No intake or output data in the 24 hours ending 05/12/24 0757 General: well-appearing, no acute distress HEENT: anicteric sclera, MMM CV: normal rate, no murmurs, LUE nonpitting edema (chronic) Lungs: coarse breath sounds bilaterally, bilateral chest rise, normal wob Abd: soft, non-tender, non-distended Skin: no visible lesions or rashes Psych: alert, engaged, appropriate mood and affect Neuro: normal speech, no gross focal deficits  Dialysis access: LUE AVF +b/t  Test  Results Reviewed Lab Results  Component Value Date   NA 141 05/12/2024   K 3.9 05/12/2024   CL 98 05/12/2024   CO2 26 05/12/2024   BUN 47 (H) 05/12/2024   CREATININE 12.06 (H) 05/12/2024   CALCIUM  8.0 (L) 05/12/2024   ALBUMIN 2.9 (L) 05/12/2024   PHOS 4.7 (H) 02/21/2021    I have reviewed relevant outside healthcare  records

## 2024-05-12 NOTE — Progress Notes (Signed)
 Nurse at bedside,patient had ambulated to bathroom and became very dyspneic,oxygen  saturation was 89 percent, on 3 liters of nasal canula. Oxygen  was increased to 4 liters saturation is now at 93 percent,Dr Maree notified.Plan of care on going. Family at bedside.

## 2024-05-12 NOTE — ED Notes (Signed)
Patient placed on 2 liters via nasal cannula. ?

## 2024-05-12 NOTE — ED Provider Notes (Signed)
 [place] [facility]PARTMENT AT [facility] Provider Note   CSN: [id number] Arrival date & time: 05/12/24  9474     Patient presents with: Cough   Megan [name] is a 63 y.o. female.   The history is provided by the patient.  Patient with extensive history including ESRD on dialysis, breast cancer, heart failure presents with cough and shortness of breath.  Over the past 2-3 days she has had increasing cough/wheezing and short of breath.  She reports a dry cough.  No chest pain, no fevers are reported.  Patient is currently on chemotherapy Patient attempted to go to dialysis this morning but they advised her to go to ER for evaluation. She is not on chronic oxygen  at home.  Patient also mentions chronic swelling of the left arm that is not new    Past Medical History:  Diagnosis Date   (HFpEF) heart failure with preserved ejection fraction (HCC)    a. 01/2019 Echo: EF 55-60%, mild conc LVH. DD.  Torn MV chordae.   Anemia    Atypical chest pain    a. 08/2018 MV: EF 59%, no ischemia; b. 02/2019 Cath: nonobs dzs.   Blood transfusion without reported diagnosis    Breast cancer (HCC) 10/12/2020   Cataract    ESRD (end stage renal disease) on dialysis [facility])    a. HD T, T, S   Essential hypertension, benign    GERD (gastroesophageal reflux disease)    Headache    Hemorrhoids    Mixed hyperlipidemia    Morbid obesity (HCC)    Non-obstructive CAD (coronary artery disease)    a. 02/2019 CathL LM nl, LAD 75m, LCX nl, RCA 25p, EF 55-65%.   PONV (postoperative nausea and vomiting)    Renal insufficiency    S/P colonoscopy 08/2009   Dr. Rollin: sessile polyp (benign lymphoid), large hemorrhoids, repeat 5-10 years   Temporal arteritis (HCC)    Type 2 diabetes mellitus (HCC)    Wears glasses     Prior to Admission medications   Medication Sig Start Date End Date Taking? Authorizing Provider  acetaminophen  (TYLENOL ) 500 MG tablet Take 1,000 mg by mouth every 8 (eight)  hours as needed for moderate pain (pain score 4-6).    [provider]  amLODipine  (NORVASC ) 5 MG tablet Take 5 mg by mouth at bedtime.    [provider]  aspirin  EC 81 MG tablet Take 81 mg by mouth daily.    [provider]  atorvastatin  (LIPITOR) 20 MG tablet TAKE 1 TABLET(20 MG) BY MOUTH DAILY 02/11/24   Duanne Butler DASEN, MD  B Complex-C-Zn-Folic Acid  (DIALYVITE  800-ZINC  15) 0.8 MG TABS Take 1 tablet by mouth daily. 06/19/22   [provider]  capecitabine  (XELODA ) 500 MG tablet Take 1 tablet (500 mg total) by mouth 2 (two) times daily after a meal. Take for 14 days, then hold for 7 days. Repeat this every 21 days. 04/10/24   Kandala, Hyndavi, MD  cinacalcet  (SENSIPAR ) 90 MG tablet Take 90 mg by mouth daily. 03/17/24   [provider]  Darbepoetin Alfa  (ARANESP , ALBUMIN FREE, IJ) Darbepoetin Alfa  (Aranesp ) 07/06/23 07/04/24  [provider]  diphenoxylate -atropine  (LOMOTIL ) 2.5-0.025 MG tablet Take 1 tablet by mouth 4 (four) times daily as needed for diarrhea or loose stools. 05/06/24   Kandala, Hyndavi, MD  fluticasone  (FLONASE ) 50 MCG/ACT nasal spray Place 1 spray into both nostrils daily.    [provider]  gabapentin  (NEURONTIN ) 300 MG capsule TAKE  1 CAPSULE(300 MG) BY MOUTH AT BEDTIME 06/12/23   Duanne Butler DASEN, MD  isosorbide  mononitrate (IMDUR ) 30 MG 24 hr tablet Take 0.5 tablets (15 mg total) by mouth daily. 03/28/24   Alvan Dorn FALCON, MD  lanthanum  (FOSRENOL ) 1000 MG chewable tablet Chew 2,000-3,000 mg by mouth See admin instructions. Take 3 tablets (3000 mg) by mouth with meals and take 2 tablets (2000 mg) with snacks 10/16/19   [provider]  lidocaine -prilocaine  (EMLA ) cream APPLY TOPICALLY TO THE AFFECTED AREA 1 TIME 10/01/23   Rogers Hai, MD  lidocaine -prilocaine  (EMLA ) cream Apply to affected area once 04/17/24   Kandala, Hyndavi, MD  loperamide  (IMODIUM  A-D) 2 MG tablet Take 2 mg by mouth 4 (four) times  daily as needed for diarrhea or loose stools.    [provider]  meclizine  (ANTIVERT ) 25 MG tablet Take 1 tablet (25 mg total) by mouth 3 (three) times daily as needed for dizziness. 06/12/23   Duanne Butler DASEN, MD  nebivolol  (BYSTOLIC ) 10 MG tablet TAKE 1 TABLET BY MOUTH IN THE MORNING AND AT BEDTIME 02/19/24   Duanne Butler DASEN, MD  nitroGLYCERIN  (NITROSTAT ) 0.4 MG SL tablet Place 1 tablet (0.4 mg total) under the tongue every 5 (five) minutes x 3 doses as needed for chest pain (if no relief after 3rd dose, proceed to ED or call 911). 09/13/22   Alvan Dorn FALCON, MD  OLANZapine  (ZYPREXA ) 5 MG tablet Take 1 tablet (5 mg total) by mouth at bedtime. 05/06/24   Kandala, Hyndavi, MD  ondansetron  (ZOFRAN ) 8 MG tablet Take 1 tablet (8 mg total) by mouth every 8 (eight) hours as needed for nausea or vomiting. 04/17/24   Davonna Siad, MD  ondansetron  (ZOFRAN -ODT) 4 MG disintegrating tablet Take 1 tablet by mouth. 04/23/24   [provider]  oxyCODONE  (ROXICODONE ) 5 MG immediate release tablet Take 1 tablet (5 mg total) by mouth every 12 (twelve) hours as needed. 03/19/24 03/19/25  Rogers Hai, MD  pantoprazole  (PROTONIX ) 40 MG tablet Take 1 tablet (40 mg total) by mouth 2 (two) times daily before a meal. 10/08/23   Rourk, Lamar HERO, MD  prochlorperazine  (COMPAZINE ) 10 MG tablet Take 1 tablet (10 mg total) by mouth every 6 (six) hours as needed for nausea or vomiting. 04/17/24   Davonna Siad, MD    Allergies: Motrin [ibuprofen]    Review of Systems  Respiratory:  Positive for cough, shortness of breath and wheezing.     Updated Vital Signs BP (!) 143/75 (BP Location: Right Arm)   Pulse 79   Temp 98 F (36.7 C) (Oral)   Resp (!) 24   Ht 1.562 m (5' 1.5)   Wt 78.1 kg   SpO2 (!) 87%   BMI 32.01 kg/m   Physical Exam CONSTITUTIONAL: Ill-appearing HEAD: Normocephalic/atraumatic EYES: EOMI/PERRL ENMT: Mucous membranes moist, no stridor NECK: supple no meningeal signs CV:  S1/S2 noted LUNGS: Decreased breath sounds bilaterally, mild tachypnea, currently on nasal cannula for hypoxia ABDOMEN: soft, nontender NEURO: Pt is awake/alert/appropriate, moves all extremitiesx4.  No facial droop.   EXTREMITIES: Edema noted to the left arm but no erythema, no crepitus, no deformities.  Dialysis access noted to the left arm with thrill noted SKIN: warm, color normal  (all labs ordered are listed, but only abnormal results are displayed) Labs Reviewed  COMPREHENSIVE METABOLIC PANEL WITH GFR - Abnormal; Notable for the following components:      Result Value   Glucose, Bld 106 (*)    BUN 47 (*)  Creatinine, Ser 12.06 (*)    Calcium  8.0 (*)    Albumin 2.9 (*)    GFR, Estimated 3 (*)    Anion gap 17 (*)    All other components within normal limits  CBC WITH DIFFERENTIAL/PLATELET - Abnormal; Notable for the following components:   RBC 2.92 (*)    Hemoglobin 10.2 (*)    HCT 32.6 (*)    MCV 111.6 (*)    MCH 34.9 (*)    RDW 16.8 (*)    All other components within normal limits  BRAIN NATRIURETIC PEPTIDE - Abnormal; Notable for the following components:   B Natriuretic Peptide 621.0 (*)    All other components within normal limits  RESP PANEL BY RT-PCR (RSV, FLU A&B, COVID)  RVPGX2  CULTURE, BLOOD (ROUTINE X 2)  CULTURE, BLOOD (ROUTINE X 2)  LACTIC ACID, PLASMA    EKG: ED ECG REPORT   Date: 05/12/2024 0544am  Rate: 78  Rhythm: normal sinus rhythm  QRS Axis: left  Intervals: QT prolonged  ST/T Wave abnormalities: normal  Conduction Disutrbances:none  Narrative Interpretation:   Old EKG Reviewed: unchanged  I have personally reviewed the EKG tracing and agree with the computerized printout as noted.  Radiology: [facility] Chest Port 1 View Result Date: 05/12/2024 CLINICAL DATA:  63 year old female with possible sepsis. Metastatic breast cancer. EXAM: PORTABLE CHEST 1 VIEW COMPARISON:  Portable chest 03/14/2024 and earlier. FINDINGS: Portable AP semi upright view  at 0550 hours. Extensive now moderate sized and relatively large rounded pulmonary masslike opacities throughout both lungs. Substantial progression compared to 04/03/2024. Associated streaky and confluent right perihilar opacity, left lung base opacity. Probable small pleural effusions. No pneumothorax. Stable right chest power port. Lung volumes and mediastinal contours not significantly changed. Stable visualized osseous structures.  Paucity of bowel gas. IMPRESSION: Portable radiographic appearance suggests severe progression of pulmonary metastatic disease since 04/03/2024. Some superimposed airspace opacity and probable small pleural effusions. Electronically Signed   By: VEAR Hurst M.D.   On: 05/12/2024 06:18     .Critical Care  Performed by: Midge Golas, MD Authorized by: Midge Golas, MD   Critical care provider statement:    Critical care time (minutes):  40   Critical care start time:  05/12/2024 5:50 AM   Critical care end time:  05/12/2024 6:30 AM   Critical care time was exclusive of:  Separately billable procedures and treating other patients   Critical care was necessary to treat or prevent imminent or life-threatening deterioration of the following conditions:  Respiratory failure, sepsis and renal failure   Critical care was time spent personally by me on the following activities:  Obtaining history from patient or surrogate, examination of patient, re-evaluation of patient's condition, pulse oximetry, ordering and review of radiographic studies, review of old charts, evaluation of patient's response to treatment, development of treatment plan with patient or surrogate, ordering and review of laboratory studies and ordering and performing treatments and interventions   I assumed direction of critical care for this patient from another provider in my specialty: no     Care discussed with: admitting provider      Medications Ordered in the ED  cefTRIAXone  (ROCEPHIN ) 1 g in  sodium chloride  0.9 % 100 mL IVPB (has no administration in time range)  azithromycin  (ZITHROMAX ) 500 mg in sodium chloride  0.9 % 250 mL IVPB (has no administration in time range)    Clinical Course as of 05/12/24 0651  [facility] May 12, 2024  0555 Patient with  extensive history including end-stage renal disease and breast cancer presents with increasing shortness of breath.  Patient is mildly tachypneic and has a new oxygen  requirement.  Patient will require admission [DW]  0649 X-ray reveals worsening metastatic disease but also superimposed infectious etiology.  Will start IV antibiotics. Patient is not septic at this time, lactate is negative There may also be a component of edema and volume overload as she is due dialysis.  Overall patient appears improved and resting comfortably. [DW]  [phone] Discussed with Dr Noralee for admission Patient will be admitted for oxygen  therapy, antibiotics and nephrology consultation [DW]    Clinical Course User Index [DW] Midge Golas, MD                                 Medical Decision Making Amount and/or Complexity of Data Reviewed Labs: ordered. Radiology: ordered.  Risk Decision regarding hospitalization.   This patient presents to the ED for concern of shortness of breath, this involves an extensive number of treatment options, and is a complaint that carries with it a high risk of complications and morbidity.  The differential diagnosis includes but is not limited to Acute coronary syndrome, pneumonia, acute pulmonary edema, pneumothorax, acute anemia, pulmonary embolism    Comorbidities that complicate the patient evaluation: Patient's presentation is complicated by their history of ESRD, breast cancer  Social Determinants of Health: Patient's multiple ER visits  increases the complexity of managing their presentation  Additional history obtained: Records reviewed oncology notes reviewed  Lab Tests: I Ordered, and personally  interpreted labs.  The pertinent results include: End-stage renal disease, mild anemia  Imaging Studies ordered: I ordered imaging studies including X-ray chest  I independently visualized and interpreted imaging which showed metastatic disease, infiltrates I agree with the radiologist interpretation  Cardiac Monitoring: The patient was maintained on a cardiac monitor.  I personally viewed and interpreted the cardiac monitor which showed an underlying rhythm of:  sinus rhythm  Medicines ordered and prescription drug management: I ordered medication including IV antibiotics for resume pneumonia   Critical Interventions:   admission, will need dialysis, antibiotics  Consultations Obtained: I requested consultation with the admitting physician [facility] , and discussed  findings as well as pertinent plan - they recommend: Admit  Reevaluation: After the interventions noted above, I reevaluated the patient and found that they have :improved  Complexity of problems addressed: Patient's presentation is most consistent with  acute presentation with potential threat to life or bodily function  Disposition: After consideration of the diagnostic results and the patient's response to treatment,  I feel that the patent would benefit from admission  .        Final diagnoses:  Acute respiratory failure with hypoxia [facility])  Multifocal pneumonia    ED Discharge Orders     None          Midge Golas, MD 05/12/24 [phone]

## 2024-05-12 NOTE — Plan of Care (Signed)
   Problem: Education: Goal: Knowledge of General Education information will improve Description Including pain rating scale, medication(s)/side effects and non-pharmacologic comfort measures Outcome: Progressing   Problem: Education: Goal: Knowledge of General Education information will improve Description Including pain rating scale, medication(s)/side effects and non-pharmacologic comfort measures Outcome: Progressing

## 2024-05-12 NOTE — H&P (Signed)
 History and Physical    Megan [name] FMW:[id number] DOB: August 24, 1960 DOA: 05/12/2024  PCP: Duanne Butler DASEN, MD   Patient coming from: Dialysis facility  Chief Complaint: Shortness of breath and cough  HPI: Megan [name] is a 63 y.o. female with medical history significant for ESRD on HD MWF, chronic diastolic CHF, breast cancer post bilateral mastectomy, hypertension, dyslipidemia, and GERD who states that she has been having ongoing shortness of breath over the last [redacted] weeks along with a dry cough that is nonproductive.  She denies any fevers or chills or sick contacts.  She attempted to go to hemodialysis this morning, but due to her symptoms they advised her to go to the ER for evaluation.  She states that she has had very poor appetite for quite some time due to her chemotherapy and has been losing some weight.  Additionally, she has swelling to her left arm that has been present over the last 3 years.   ED Course: Vital signs stable and patient afebrile.  Laboratory data unremarkable with patient on dialysis, but chest x-ray showing some signs of progressive metastatic disease.  Patient has been ordered breathing treatment as well as azithromycin  and Rocephin .  Flu/COVID/RSV testing negative.  Nephrology consulted for dialysis.  Review of Systems: Reviewed as noted above, otherwise negative.  Past Medical History:  Diagnosis Date   (HFpEF) heart failure with preserved ejection fraction (HCC)    a. 01/2019 Echo: EF 55-60%, mild conc LVH. DD.  Torn MV chordae.   Anemia    Atypical chest pain    a. 08/2018 MV: EF 59%, no ischemia; b. 02/2019 Cath: nonobs dzs.   Blood transfusion without reported diagnosis    Breast cancer (HCC) 10/12/2020   Cataract    ESRD (end stage renal disease) on dialysis [facility])    a. HD T, T, S   Essential hypertension, benign    GERD (gastroesophageal reflux disease)    Headache    Hemorrhoids    Mixed hyperlipidemia    Morbid obesity (HCC)     Non-obstructive CAD (coronary artery disease)    a. 02/2019 CathL LM nl, LAD 66m, LCX nl, RCA 25p, EF 55-65%.   PONV (postoperative nausea and vomiting)    Renal insufficiency    S/P colonoscopy 08/2009   Dr. Rollin: sessile polyp (benign lymphoid), large hemorrhoids, repeat 5-10 years   Temporal arteritis (HCC)    Type 2 diabetes mellitus (HCC)    Wears glasses     Past Surgical History:  Procedure Laterality Date   A/V SHUNT INTERVENTION Left 04/16/2024   Procedure: A/V SHUNT INTERVENTION;  Surgeon: Pearline Norman RAMAN, MD;  Location: [facility];  Service: Cardiovascular;  Laterality: Left;   ABDOMINAL HYSTERECTOMY     APPENDECTOMY     ARTERY BIOPSY N/A 05/09/2018   Procedure: RIGHT TEMPORAL ARTERY BIOPSY;  Surgeon: Kimble Agent, MD;  Location: [facility] OR;  Service: General;  Laterality: N/A;   BIOPSY  10/04/2023   Procedure: BIOPSY;  Surgeon: Shaaron Lamar HERO, MD;  Location: [facility];  Service: Endoscopy;;   BREAST BIOPSY Right 06/15/2020   Procedure: RIGHT BREAST BIOPSY;  Surgeon: Mavis Anes, MD;  Location: [facility];  Service: General;  Laterality: Right;   CATARACT EXTRACTION W/PHACO Left 02/09/2017   Procedure: CATARACT EXTRACTION PHACO AND INTRAOCULAR LENS PLACEMENT LEFT EYE;  Surgeon: Perley Hamilton, MD;  Location: [facility];  Service: Ophthalmology;  Laterality: Left;  CDE: 4.89   CATARACT EXTRACTION W/PHACO Right 06/04/2017  Procedure: CATARACT EXTRACTION PHACO AND INTRAOCULAR LENS PLACEMENT (IOC);  Surgeon: Perley Hamilton, MD;  Location: [facility];  Service: Ophthalmology;  Laterality: Right;  CDE: 4.12   CHOLECYSTECTOMY  09/29/2011   Procedure: LAPAROSCOPIC CHOLECYSTECTOMY;  Surgeon: Oneil DELENA Budge, MD;  Location: [facility];  Service: General;  Laterality: N/A;   COLONOSCOPY  08/2009   Dr. Rollin: sessile polyp (benign lymphoid), large hemorrhoids, repeat 5-10 years   COLONOSCOPY N/A 06/12/2016   prominent hemorrhoids   COLONOSCOPY WITH PROPOFOL  N/A 06/16/2021   Procedure: COLONOSCOPY WITH  PROPOFOL ;  Surgeon: Shaaron Lamar HERO, MD;  [facility];  Service: Endoscopy;  Laterality: N/A;  9:30am (dialysis pt)   ESOPHAGOGASTRODUODENOSCOPY  09/05/2011   MFM:Dfjoo hiatal hernia; remainder of exam normal. No explanation for patient's abdominal pain with today's examination   ESOPHAGOGASTRODUODENOSCOPY N/A 12/17/2013   Dr. Shaaron: gastric erythema, erosion, mild chronic inflammation on path    ESOPHAGOGASTRODUODENOSCOPY N/A 12/31/2023   Procedure: EGD (ESOPHAGOGASTRODUODENOSCOPY);  Surgeon: Wilhelmenia Aloha Raddle., MD;  Location: [facility];  Service: Gastroenterology;  Laterality: N/A;   ESOPHAGOGASTRODUODENOSCOPY (EGD) WITH PROPOFOL  N/A 06/16/2021   Procedure: ESOPHAGOGASTRODUODENOSCOPY (EGD) WITH PROPOFOL ;  Surgeon: Shaaron Lamar HERO, MD;  [facility];  Service: Endoscopy;  Laterality: N/A;   ESOPHAGOGASTRODUODENOSCOPY (EGD) WITH PROPOFOL  N/A 10/04/2023   Procedure: ESOPHAGOGASTRODUODENOSCOPY (EGD) WITH PROPOFOL ;  Surgeon: Shaaron Lamar HERO, MD;  [facility];  Service: Endoscopy;  Laterality: N/A;  200pm, ok rm 1-2, pt knows to arrive at 6:45   EUS N/A 12/31/2023   Procedure: ULTRASOUND, UPPER GI TRACT, ENDOSCOPIC;  Surgeon: Wilhelmenia Aloha Raddle., MD;  Location: [facility];  Service: Gastroenterology;  Laterality: N/A;   EXCISION OF BREAST BIOPSY Right 10/12/2020   Procedure: EXCISION OF RIGHT BREAST BIOPSY;  Surgeon: Budge Oneil, MD;  Location: [facility];  Service: General;  Laterality: Right;   IR DIALY SHUNT INTRO NEEDLE/INTRACATH INITIAL W/IMG LEFT Left 07/17/2023   LAPAROSCOPIC APPENDECTOMY  09/29/2011   Procedure: APPENDECTOMY LAPAROSCOPIC;  Surgeon: Oneil DELENA Budge, MD;  Location: [facility];  Service: General;;  incidental appendectomy   LEFT HEART CATH AND CORONARY ANGIOGRAPHY N/A 02/28/2019   Procedure: LEFT HEART CATH AND CORONARY ANGIOGRAPHY;  Surgeon: Dann Candyce RAMAN, MD;  Location: [facility] INVASIVE CV LAB;  Service: Cardiovascular;  Laterality: N/A;    MASS EXCISION Right 01/18/2023   Procedure: EXCISION MASS, RIGHT CHEST S/P MASTECTOMY;  Surgeon: Budge Oneil, MD;  Location: [facility];  Service: General;  Laterality: Right;   MASTECTOMY MODIFIED RADICAL Right 02/18/2020   Procedure: MASTECTOMY MODIFIED RADICAL;  Surgeon: Budge Oneil, MD;  Location: [facility];  Service: General;  Laterality: Right;   MASTECTOMY, PARTIAL Right 07/13/2020   Procedure: RIGHT PARTIAL MASTECTOMY;  Surgeon: Budge Oneil, MD;  Location: [facility];  Service: General;  Laterality: Right;   PARTIAL MASTECTOMY WITH NEEDLE LOCALIZATION AND AXILLARY SENTINEL LYMPH NODE BX Right 09/18/2018   Procedure: RIGHT PARTIAL MASTECTOMY AFTER NEEDLE LOCALIZATION, SENTINEL LYMPH NODE BIOPSY RIGHT AXILLA;  Surgeon: Budge Oneil, MD;  Location: [facility];  Service: General;  Laterality: Right;   POLYPECTOMY  06/16/2021   Procedure: POLYPECTOMY;  Surgeon: Shaaron Lamar HERO, MD;  [facility];  Service: Endoscopy;;   PORT-A-CATH REMOVAL Right 11/29/2023   Procedure: REMOVAL PORT-A-CATH;  Surgeon: Budge Oneil, MD;  Location: [facility];  Service: General;  Laterality: Right;  MINOR PROCEDURE ROOM   PORTACATH PLACEMENT Right 06/07/2023   Procedure: INSERTION PORT-A-CATH, RIGHT  (DIALYSIS ACCESS ON LEFT);  Surgeon: Budge Oneil, MD;  Location: [facility];  Service: General;  Laterality: Right;   PORTACATH PLACEMENT Right 04/03/2024   Procedure: INSERTION, TUNNELED CENTRAL VENOUS DEVICE, WITH PORT;  Surgeon: Mavis Anes, MD;  Location: [facility];  Service: General;  Laterality: Right;   SIMPLE MASTECTOMY WITH AXILLARY SENTINEL NODE BIOPSY Left 06/15/2020   Procedure: LEFT SIMPLE MASTECTOMY;  Surgeon: Mavis Anes, MD;  Location: [facility];  Service: General;  Laterality: Left;     reports that she has never smoked. She has never been exposed to tobacco smoke. She has never used smokeless tobacco. She reports that she does not drink alcohol and does not use drugs.  Allergies  Allergen Reactions    Motrin [Ibuprofen] Other (See Comments)    ESRD     Family History  Problem Relation Age of Onset   Hypertension Mother    Coronary artery disease Mother    Diabetes Mother    Heart attack Father    Hypertension Sister    Coronary artery disease Sister    Hypertension Brother    Colon cancer Brother    Heart attack Maternal Grandmother    Heart attack Maternal Grandfather    Heart attack Paternal Grandmother    Heart attack Paternal Grandfather    Hypertension Son    Heart attack Maternal Aunt    Hypertension Maternal Aunt    Diabetes Maternal Aunt    Heart attack Maternal Uncle    Hypertension Maternal Uncle    Diabetes Maternal Uncle    Heart attack Paternal Aunt    Hypertension Paternal Aunt    Diabetes Paternal Aunt    Heart attack Paternal Uncle    Hypertension Paternal Uncle    Diabetes Paternal Uncle     Prior to Admission medications   Medication Sig Start Date End Date Taking? Authorizing Provider  acetaminophen  (TYLENOL ) 500 MG tablet Take 1,000 mg by mouth every 8 (eight) hours as needed for moderate pain (pain score 4-6).    [provider]  amLODipine  (NORVASC ) 5 MG tablet Take 5 mg by mouth at bedtime.    [provider]  aspirin  EC 81 MG tablet Take 81 mg by mouth daily.    [provider]  atorvastatin  (LIPITOR) 20 MG tablet TAKE 1 TABLET(20 MG) BY MOUTH DAILY 02/11/24   Duanne Butler DASEN, MD  B Complex-C-Zn-Folic Acid  (DIALYVITE  800-ZINC  15) 0.8 MG TABS Take 1 tablet by mouth daily. 06/19/22   [provider]  capecitabine  (XELODA ) 500 MG tablet Take 1 tablet (500 mg total) by mouth 2 (two) times daily after a meal. Take for 14 days, then hold for 7 days. Repeat this every 21 days. 04/10/24   Kandala, Hyndavi, MD  cinacalcet  (SENSIPAR ) 90 MG tablet Take 90 mg by mouth daily. 03/17/24   [provider]  Darbepoetin Alfa  (ARANESP , ALBUMIN FREE, IJ) Darbepoetin Alfa  (Aranesp ) 07/06/23 07/04/24  [provider]   diphenoxylate -atropine  (LOMOTIL ) 2.5-0.025 MG tablet Take 1 tablet by mouth 4 (four) times daily as needed for diarrhea or loose stools. 05/06/24   Kandala, Hyndavi, MD  fluticasone  (FLONASE ) 50 MCG/ACT nasal spray Place 1 spray into both nostrils daily.    [provider]  gabapentin  (NEURONTIN ) 300 MG capsule TAKE 1 CAPSULE(300 MG) BY MOUTH AT BEDTIME 06/12/23   Duanne Butler DASEN, MD  isosorbide  mononitrate (IMDUR ) 30 MG 24 hr tablet Take 0.5 tablets (15 mg total) by mouth daily. 03/28/24   Alvan Dorn FALCON, MD  lanthanum  (FOSRENOL ) 1000 MG chewable tablet Chew 2,000-3,000 mg by  mouth See admin instructions. Take 3 tablets (3000 mg) by mouth with meals and take 2 tablets (2000 mg) with snacks 10/16/19   [provider]  lidocaine -prilocaine  (EMLA ) cream APPLY TOPICALLY TO THE AFFECTED AREA 1 TIME 10/01/23   Rogers Hai, MD  lidocaine -prilocaine  (EMLA ) cream Apply to affected area once 04/17/24   Kandala, Hyndavi, MD  loperamide  (IMODIUM  A-D) 2 MG tablet Take 2 mg by mouth 4 (four) times daily as needed for diarrhea or loose stools.    [provider]  meclizine  (ANTIVERT ) 25 MG tablet Take 1 tablet (25 mg total) by mouth 3 (three) times daily as needed for dizziness. 06/12/23   Duanne Butler DASEN, MD  nebivolol  (BYSTOLIC ) 10 MG tablet TAKE 1 TABLET BY MOUTH IN THE MORNING AND AT BEDTIME 02/19/24   Duanne Butler DASEN, MD  nitroGLYCERIN  (NITROSTAT ) 0.4 MG SL tablet Place 1 tablet (0.4 mg total) under the tongue every 5 (five) minutes x 3 doses as needed for chest pain (if no relief after 3rd dose, proceed to ED or call 911). 09/13/22   Alvan Dorn FALCON, MD  OLANZapine  (ZYPREXA ) 5 MG tablet Take 1 tablet (5 mg total) by mouth at bedtime. 05/06/24   Kandala, Hyndavi, MD  ondansetron  (ZOFRAN ) 8 MG tablet Take 1 tablet (8 mg total) by mouth every 8 (eight) hours as needed for nausea or vomiting. 04/17/24   Davonna Siad, MD  ondansetron  (ZOFRAN -ODT) 4 MG disintegrating tablet  Take 1 tablet by mouth. 04/23/24   [provider]  oxyCODONE  (ROXICODONE ) 5 MG immediate release tablet Take 1 tablet (5 mg total) by mouth every 12 (twelve) hours as needed. 03/19/24 03/19/25  Rogers Hai, MD  pantoprazole  (PROTONIX ) 40 MG tablet Take 1 tablet (40 mg total) by mouth 2 (two) times daily before a meal. 10/08/23   Rourk, Lamar HERO, MD  prochlorperazine  (COMPAZINE ) 10 MG tablet Take 1 tablet (10 mg total) by mouth every 6 (six) hours as needed for nausea or vomiting. 04/17/24   Davonna Siad, MD    Physical Exam: Vitals:   05/12/24 0645 05/12/24 0700 05/12/24 0742 05/12/24 0801  BP: (!) 152/82 (!) 161/91 (!) 162/80   Pulse: 75 74 77   Resp: (!) 26 (!) 25    Temp:   97.8 F (36.6 C)   TempSrc:   Oral   SpO2: 94% 94% 91% 94%  Weight:      Height:        Constitutional: NAD, calm, comfortable, obese Vitals:   05/12/24 0645 05/12/24 0700 05/12/24 0742 05/12/24 0801  BP: (!) 152/82 (!) 161/91 (!) 162/80   Pulse: 75 74 77   Resp: (!) 26 (!) 25    Temp:   97.8 F (36.6 C)   TempSrc:   Oral   SpO2: 94% 94% 91% 94%  Weight:      Height:       Eyes: lids and conjunctivae normal Neck: normal, supple Respiratory: clear to auscultation bilaterally. Normal respiratory effort. No accessory muscle use.  Nasal cannula oxygen  Cardiovascular: Regular rate and rhythm, no murmurs. Abdomen: no tenderness, no distention. Bowel sounds positive.  Musculoskeletal: Left arm swelling noted Skin: no rashes, lesions, ulcers.  Psychiatric: Flat affect  Labs on Admission: I have personally reviewed following labs and imaging studies  CBC: Recent Labs  Lab 05/06/24 1203 05/08/24 1005 05/12/24 0545  WBC 8.5 6.9 9.1  NEUTROABS 5.9 4.8 6.8  HGB 10.1* 9.6* 10.2*  HCT 32.4* 31.2* 32.6*  MCV 113.3* 113.5* 111.6*  PLT 268 257 274   Basic Metabolic Panel: Recent Labs  Lab 05/06/24 1203 05/08/24 1005 05/12/24 0545  NA 138 139 141  K 3.8 3.3* 3.9  CL 94* 96* 98  CO2  29 26 26   GLUCOSE 102* 124* 106*  BUN 28* 27* 47*  CREATININE 10.06* 8.94* 12.06*  CALCIUM  8.3* 8.5* 8.0*  MG 2.2 2.2  --    GFR: Estimated Creatinine Clearance: 4.6 mL/min (A) (by C-G formula based on SCr of 12.06 mg/dL (H)). Liver Function Tests: Recent Labs  Lab 05/06/24 1203 05/08/24 1005 05/12/24 0545  AST 22 24 22   ALT 13 13 16   ALKPHOS 75 73 91  BILITOT 0.7 0.8 0.7  PROT 7.3 6.8 6.8  ALBUMIN 3.1* 3.0* 2.9*   No results for input(s): LIPASE, AMYLASE in the last 168 hours. No results for input(s): AMMONIA in the last 168 hours. Coagulation Profile: No results for input(s): INR, PROTIME in the last 168 hours. Cardiac Enzymes: No results for input(s): CKTOTAL, CKMB, CKMBINDEX, TROPONINI in the last 168 hours. BNP (last 3 results) No results for input(s): PROBNP in the last 8760 hours. HbA1C: No results for input(s): HGBA1C in the last 72 hours. CBG: No results for input(s): GLUCAP in the last 168 hours. Lipid Profile: No results for input(s): CHOL, HDL, LDLCALC, TRIG, CHOLHDL, LDLDIRECT in the last 72 hours. Thyroid  Function Tests: No results for input(s): TSH, T4TOTAL, FREET4, T3FREE, THYROIDAB in the last 72 hours. Anemia Panel: No results for input(s): VITAMINB12, FOLATE, FERRITIN, TIBC, IRON , RETICCTPCT in the last 72 hours. Urine analysis:    Component Value Date/Time   COLORURINE YELLOW 03/30/2023 0758   APPEARANCEUR HAZY (A) 03/30/2023 0758   LABSPEC 1.009 03/30/2023 0758   PHURINE 9.0 (H) 03/30/2023 0758   GLUCOSEU 50 (A) 03/30/2023 0758   HGBUR NEGATIVE 03/30/2023 0758   BILIRUBINUR NEGATIVE 03/30/2023 0758   KETONESUR NEGATIVE 03/30/2023 0758   PROTEINUR 100 (A) 03/30/2023 0758   UROBILINOGEN 0.2 08/19/2011 1103   NITRITE NEGATIVE 03/30/2023 0758   LEUKOCYTESUR NEGATIVE 03/30/2023 0758    Radiological Exams on Admission: DG Chest Port 1 View Result Date: 05/12/2024 CLINICAL DATA:   63 year old female with possible sepsis. Metastatic breast cancer. EXAM: PORTABLE CHEST 1 VIEW COMPARISON:  Portable chest 03/14/2024 and earlier. FINDINGS: Portable AP semi upright view at 0550 hours. Extensive now moderate sized and relatively large rounded pulmonary masslike opacities throughout both lungs. Substantial progression compared to 04/03/2024. Associated streaky and confluent right perihilar opacity, left lung base opacity. Probable small pleural effusions. No pneumothorax. Stable right chest power port. Lung volumes and mediastinal contours not significantly changed. Stable visualized osseous structures.  Paucity of bowel gas. IMPRESSION: Portable radiographic appearance suggests severe progression of pulmonary metastatic disease since 04/03/2024. Some superimposed airspace opacity and probable small pleural effusions. Electronically Signed   By: VEAR Hurst M.D.   On: 05/12/2024 06:18    Assessment/Plan Principal Problem:   Acute respiratory failure with hypoxia (HCC) Active Problems:   OBESITY   Essential hypertension, benign   GERD (gastroesophageal reflux disease)   ESRD (end stage renal disease) (HCC)   Acute diastolic CHF (congestive heart failure) (HCC)   Breast cancer (HCC)   Volume overload    Acute hypoxemic respiratory failure with dyspnea and cough-multifactorial - Likely in the setting of some volume overload with HFpEF versus some bronchitis and/or atypical pneumonia - Continue IV antibiotics with azithromycin  and Rocephin  for now and check procalcitonin - Check respiratory panel - Breathing treatments as needed for  cough or wheezing - Dialysis to assist with volume management - Wean to room air as tolerated  ESRD on HD MWF - Nephrology consulted for dialysis  Hypertension - Continue amlodipine , Imdur , and Nebivolol   Dyslipidemia - Continue statin  History of breast cancer now with concern for metastasis - Noted prior mastectomy - Currently undergoing  chemotherapy under supervision of Dr. Kandala - Chest x-ray concerning for progression of metastatic disease  GERD - PPI  Obesity, class I - BMI 32.01   DVT prophylaxis: Heparin  Code Status: Full Family Communication: Husband and sister at bedside 9/29 Disposition Plan: Admit for evaluation of shortness of breath and cough Consults called: Nephrology Admission status: Observation, MedSurg  Severity of Illness: The appropriate patient status for this patient is OBSERVATION. Observation status is judged to be reasonable and necessary in order to provide the required intensity of service to ensure the patient's safety. The patient's presenting symptoms, physical exam findings, and initial radiographic and laboratory data in the context of their medical condition is felt to place them at decreased risk for further clinical deterioration. Furthermore, it is anticipated that the patient will be medically stable for discharge from the hospital within 2 midnights of admission.    Shirelle Tootle D Ka Flammer DO [facility]  If 7PM-7AM, please contact night-coverage [email or web address]  05/12/2024, 8:06 AM

## 2024-05-12 NOTE — ED Triage Notes (Signed)
 Pt arrives from dialysis center after they advised her to come to be seen. Pt states she has had a cough and wheezing since Friday.   Pt also c/o generalized weakness and SHOB.

## 2024-05-13 ENCOUNTER — Other Ambulatory Visit: Payer: Self-pay

## 2024-05-13 DIAGNOSIS — E66811 Obesity, class 1: Secondary | ICD-10-CM | POA: Diagnosis present

## 2024-05-13 DIAGNOSIS — E1122 Type 2 diabetes mellitus with diabetic chronic kidney disease: Secondary | ICD-10-CM | POA: Diagnosis present

## 2024-05-13 DIAGNOSIS — R0602 Shortness of breath: Secondary | ICD-10-CM | POA: Diagnosis present

## 2024-05-13 DIAGNOSIS — E782 Mixed hyperlipidemia: Secondary | ICD-10-CM | POA: Diagnosis present

## 2024-05-13 DIAGNOSIS — Z171 Estrogen receptor negative status [ER-]: Secondary | ICD-10-CM

## 2024-05-13 DIAGNOSIS — Z9981 Dependence on supplemental oxygen: Secondary | ICD-10-CM | POA: Diagnosis not present

## 2024-05-13 DIAGNOSIS — Z992 Dependence on renal dialysis: Secondary | ICD-10-CM | POA: Diagnosis not present

## 2024-05-13 DIAGNOSIS — I5033 Acute on chronic diastolic (congestive) heart failure: Secondary | ICD-10-CM | POA: Diagnosis present

## 2024-05-13 DIAGNOSIS — Z6832 Body mass index (BMI) 32.0-32.9, adult: Secondary | ICD-10-CM | POA: Diagnosis not present

## 2024-05-13 DIAGNOSIS — I132 Hypertensive heart and chronic kidney disease with heart failure and with stage 5 chronic kidney disease, or end stage renal disease: Secondary | ICD-10-CM | POA: Diagnosis present

## 2024-05-13 DIAGNOSIS — N2581 Secondary hyperparathyroidism of renal origin: Secondary | ICD-10-CM | POA: Diagnosis present

## 2024-05-13 DIAGNOSIS — D8481 Immunodeficiency due to conditions classified elsewhere: Secondary | ICD-10-CM | POA: Diagnosis present

## 2024-05-13 DIAGNOSIS — J9601 Acute respiratory failure with hypoxia: Secondary | ICD-10-CM | POA: Diagnosis present

## 2024-05-13 DIAGNOSIS — K521 Toxic gastroenteritis and colitis: Secondary | ICD-10-CM | POA: Diagnosis present

## 2024-05-13 DIAGNOSIS — Z723 Lack of physical exercise: Secondary | ICD-10-CM | POA: Diagnosis not present

## 2024-05-13 DIAGNOSIS — I2510 Atherosclerotic heart disease of native coronary artery without angina pectoris: Secondary | ICD-10-CM | POA: Diagnosis present

## 2024-05-13 DIAGNOSIS — J189 Pneumonia, unspecified organism: Secondary | ICD-10-CM | POA: Diagnosis present

## 2024-05-13 DIAGNOSIS — C787 Secondary malignant neoplasm of liver and intrahepatic bile duct: Secondary | ICD-10-CM | POA: Diagnosis present

## 2024-05-13 DIAGNOSIS — T451X5A Adverse effect of antineoplastic and immunosuppressive drugs, initial encounter: Secondary | ICD-10-CM | POA: Diagnosis present

## 2024-05-13 DIAGNOSIS — N186 End stage renal disease: Secondary | ICD-10-CM | POA: Diagnosis present

## 2024-05-13 DIAGNOSIS — C799 Secondary malignant neoplasm of unspecified site: Secondary | ICD-10-CM

## 2024-05-13 DIAGNOSIS — C7800 Secondary malignant neoplasm of unspecified lung: Secondary | ICD-10-CM | POA: Diagnosis present

## 2024-05-13 DIAGNOSIS — C773 Secondary and unspecified malignant neoplasm of axilla and upper limb lymph nodes: Secondary | ICD-10-CM | POA: Diagnosis present

## 2024-05-13 DIAGNOSIS — K219 Gastro-esophageal reflux disease without esophagitis: Secondary | ICD-10-CM | POA: Diagnosis present

## 2024-05-13 DIAGNOSIS — D631 Anemia in chronic kidney disease: Secondary | ICD-10-CM | POA: Diagnosis present

## 2024-05-13 LAB — CBC
HCT: 32.2 % — ABNORMAL LOW (ref 36.0–46.0)
Hemoglobin: 10.1 g/dL — ABNORMAL LOW (ref 12.0–15.0)
MCH: 34.6 pg — ABNORMAL HIGH (ref 26.0–34.0)
MCHC: 31.4 g/dL (ref 30.0–36.0)
MCV: 110.3 fL — ABNORMAL HIGH (ref 80.0–100.0)
Platelets: 252 K/uL (ref 150–400)
RBC: 2.92 MIL/uL — ABNORMAL LOW (ref 3.87–5.11)
RDW: 17.1 % — ABNORMAL HIGH (ref 11.5–15.5)
WBC: 9.6 K/uL (ref 4.0–10.5)
nRBC: 0 % (ref 0.0–0.2)

## 2024-05-13 LAB — BASIC METABOLIC PANEL WITH GFR
Anion gap: 15 (ref 5–15)
BUN: 29 mg/dL — ABNORMAL HIGH (ref 8–23)
CO2: 27 mmol/L (ref 22–32)
Calcium: 7.7 mg/dL — ABNORMAL LOW (ref 8.9–10.3)
Chloride: 96 mmol/L — ABNORMAL LOW (ref 98–111)
Creatinine, Ser: 8.07 mg/dL — ABNORMAL HIGH (ref 0.44–1.00)
GFR, Estimated: 5 mL/min — ABNORMAL LOW (ref >60)
Glucose, Bld: 88 mg/dL (ref 70–99)
Potassium: 4.4 mmol/L (ref 3.5–5.1)
Sodium: 138 mmol/L (ref 135–145)

## 2024-05-13 LAB — MAGNESIUM: Magnesium: 2 mg/dL (ref 1.7–2.4)

## 2024-05-13 LAB — HEPATITIS B SURFACE ANTIBODY, QUANTITATIVE: Hep B S AB Quant (Post): 126 m[IU]/mL

## 2024-05-13 MED ORDER — SALINE SPRAY 0.65 % NA SOLN
1.0000 | NASAL | Status: DC | PRN
  Administered 2024-05-13: 1 via NASAL
  Filled 2024-05-13: qty 44

## 2024-05-13 MED ORDER — SODIUM CHLORIDE 0.9 % IV SOLN
500.0000 mg | INTRAVENOUS | Status: DC
  Administered 2024-05-13 – 2024-05-14 (×2): 500 mg via INTRAVENOUS
  Filled 2024-05-13 (×3): qty 5

## 2024-05-13 NOTE — Plan of Care (Signed)
   Problem: Activity: Goal: Risk for activity intolerance will decrease Outcome: Progressing   Problem: Coping: Goal: Level of anxiety will decrease Outcome: Progressing

## 2024-05-13 NOTE — Progress Notes (Addendum)
   05/13/24 1111  TOC Brief Assessment  Insurance and Status Reviewed  Patient has primary care physician Yes  Home environment has been reviewed Home with spouse  Prior level of function: Independent  Prior/Current Home Services No current home services  Social Drivers of Health Review SDOH reviewed no interventions necessary  Readmission risk has been reviewed Yes  Transition of care needs no transition of care needs at this time   Transition of Care Department [facility]) has reviewed patient and no TOC needs have been identified at this time. We will continue to monitor patient advancement through interdisciplinary progression rounds. If new patient transition needs arise, please place a TOC consult.  Currently on 4L, TOC follow for the need of home oxygen .

## 2024-05-13 NOTE — Progress Notes (Signed)
   05/13/24 1426  Spiritual Encounters  Type of Visit Initial  Care provided to: Significant other;Patient (Husband Megan [name])  Referral source Chaplain team  Reason for visit Routine spiritual support  OnCall Visit No  Spiritual Framework  Presenting Themes Meaning/purpose/sources of inspiration;Values and beliefs  Community/Connection Family;Faith community  Patient Stress Factors Health changes  Family Stress Factors None identified  Interventions  Spiritual Care Interventions Made Established relationship of care and support;Narrative/life review;Encouragement  Intervention Outcomes  Outcomes Connection to spiritual care;Awareness around self/spiritual resourses  Spiritual Care Plan  Spiritual Care Issues Still Outstanding Chaplain will continue to follow   Chaplain received referral stating that Pt known to the [facility] was inpatient at [facility].  Chaplain Visited the Pt to establish connection to Spiritual Care.  Pt open to spiritual care and quickly expressed Megan [name] faith stating that she is fully relying upon Jesus as she battles cancer for the 4th time, and receives dialysis 3 times a week.  Pt also states that her husband, her Megan [name] is her strongest and best source of support  Pt's seems to have her care and support needs met at this time.  Spiritual Care will continue to follow while Pt remains inpatient.  Maude Roll, MDiv  Chaplain, [facility] [email or web address] [phone]

## 2024-05-13 NOTE — Progress Notes (Signed)
 [facility] Progress Note    Assessment/ Plan:   ESRD  -outpatient HD orders: [facility]. MWF.  3 hours 15 minutes.  EDW 79.9 kg.  2K/2.5 calcium .  aVF, 15G.  Flow rates: 400/500.  Heparin : None.  Meds: Aranesp  160 mcg once weekly (last dose 9/26), Venofer  50 mg once weekly, calcitriol  1.5 mcg every treatment -HD tomorrow per her MWF schedule   SOB -secondary to worsening metastatic disease +/- pneumonia -mgmt+abx per primary service--currently on rocephin  and azithro   Volume/ hypertension  -EDW 79.7kg. UF as tolerated   Anemia of Chronic Kidney Disease -Hemoglobin 10.2. Hold ESA moving forward in the context of active malignancy+chemo unless cleared by oncology -Transfuse PRN for Hgb <7   Secondary Hyperparathyroidism/Hyperphosphatemia - resume home meds, monitor phos, will resume calcitriol  with HD    Vascular access Chronic LUE swelling -s/p recent fistulogram on 9/3 for chronic left arm swelling: found to have widely patent system, swelling likely related to lymph node mets  Breast Ca with mets -oncology following peripherally   Discussed with the primary team. Discussed with husband at the bedside  Ephriam Stank, MD [facility]  Subjective:   Patient seen and examined bedside. Husband at bedside. Net UF yesterday with HD: ~1.6L She reports that cough is better, still has residual rib pain. LUE swelling somewhat improved after HD as per patient and husband. Did feel sick during HD hence could not reach UFG.   Objective:   BP 116/64   Pulse 80   Temp 98.3 F (36.8 C) (Oral)   Resp 20   Ht 5' 1.5 (1.562 m)   Wt 81.8 kg   SpO2 98%   BMI 33.52 kg/m   Intake/Output Summary (Last 24 hours) at 05/13/2024 0911 Last data filed at 05/13/2024 0830 Gross per 24 hour  Intake 400.53 ml  Output 1600 ml  Net -1199.47 ml   Weight change: 5.991 kg  Physical Exam: Gen: NAD CVS: RRR Resp: decreased breath sounds  bibasilar, no w/r/r/c appreciated, unlabored, normal wob Abd: soft, NT/ND Ext: no sig edema b/l Les, LUE swelling (chronic)-slightly improved Neuro: awake, alert Dialysis access: LUE AVF +b/t  Imaging: CT CHEST W CONTRAST Result Date: 05/12/2024 CLINICAL DATA:  Breast cancer, invasive, stage IV, initial workup. * Tracking Code: BO * EXAM: CT CHEST WITH CONTRAST TECHNIQUE: Multidetector CT imaging of the chest was performed during intravenous contrast administration. RADIATION DOSE REDUCTION: This exam was performed according to the departmental dose-optimization program which includes automated exposure control, adjustment of the mA and/or kV according to patient size and/or use of iterative reconstruction technique. CONTRAST:  75mL OMNIPAQUE  IOHEXOL  300 MG/ML  SOLN COMPARISON:  Nuclear medicine PET scan from 01/31/2024 and CT scan chest from 01/24/2024. FINDINGS: Cardiovascular: Normal cardiac size. No pericardial effusion. No aortic aneurysm. There are coronary artery calcifications, in keeping with coronary artery disease. There are also mild peripheral atherosclerotic vascular calcifications of thoracic aorta and its major branches. Mediastinum/Nodes: Visualized thyroid  gland appears grossly unremarkable. No solid / cystic mediastinal masses. The esophagus is nondistended precluding optimal assessment. There multiple new/enlarging mediastinal, bilateral hilar and left axillary lymph nodes with largest in the left axilla and supra carinal region both measuring up to 1.8 x 2.8 cm, compatible with worsening metastases. No significant right axillary lymphadenopathy by size criteria. There are multiple surgical staples in the right axillary region. Lungs/Pleura: Since the prior study there are extensive new solid pulmonary nodules ranging in size from few mm up to 5.9  cm, compatible with worsening metastatic disease. There is small-to-moderate right pleural effusion. No left pleural effusion. No pneumothorax  on either side. There also peripheral subsegmental atelectatic changes in the middle lobe. Upper Abdomen: There is a new 1.0 x 1.2 cm ill-defined hypoattenuating lesion in the right hepatic lobe segment 6/7, which is incompletely characterized on the current exam but concerning for metastasis in the given clinical setting. Remaining visualized upper abdominal viscera within normal limits. Musculoskeletal: A CT Port-a-Cath is seen in the right upper chest wall with the catheter terminating in the lower portion of superior vena cava. Visualized soft tissues of the chest wall are otherwise grossly unremarkable. No suspicious osseous lesions. There are mild multilevel degenerative changes in the visualized spine. IMPRESSION: 1. Since the prior study, there are extensive new solid pulmonary nodules, mediastinal, bilateral hilar and left axillary lymphadenopathy, compatible with worsening metastatic disease. 2. There is also a new 1.0 x 1.2 cm ill-defined hypoattenuating lesion in the right hepatic lobe, which is incompletely characterized on the current exam but concerning for metastasis in the given clinical setting. 3. New small-to-moderate right pleural effusion. 4. Multiple other nonacute observations, as described above. Aortic Atherosclerosis (ICD10-I70.0). Electronically Signed   By: Ree Molt M.D.   On: 05/12/2024 15:25   DG Chest Port 1 View Result Date: 05/12/2024 CLINICAL DATA:  63 year old female with possible sepsis. Metastatic breast cancer. EXAM: PORTABLE CHEST 1 VIEW COMPARISON:  Portable chest 03/14/2024 and earlier. FINDINGS: Portable AP semi upright view at 0550 hours. Extensive now moderate sized and relatively large rounded pulmonary masslike opacities throughout both lungs. Substantial progression compared to 04/03/2024. Associated streaky and confluent right perihilar opacity, left lung base opacity. Probable small pleural effusions. No pneumothorax. Stable right chest power port. Lung  volumes and mediastinal contours not significantly changed. Stable visualized osseous structures.  Paucity of bowel gas. IMPRESSION: Portable radiographic appearance suggests severe progression of pulmonary metastatic disease since 04/03/2024. Some superimposed airspace opacity and probable small pleural effusions. Electronically Signed   By: VEAR Hurst M.D.   On: 05/12/2024 06:18    Labs: BMET Recent Labs  Lab 05/06/24 1203 05/08/24 1005 05/12/24 0545 05/13/24 0355  NA 138 139 141 138  K 3.8 3.3* 3.9 4.4  CL 94* 96* 98 96*  CO2 29 26 26 27   GLUCOSE 102* 124* 106* 88  BUN 28* 27* 47* 29*  CREATININE 10.06* 8.94* 12.06* 8.07*  CALCIUM  8.3* 8.5* 8.0* 7.7*   CBC Recent Labs  Lab 05/06/24 1203 05/08/24 1005 05/12/24 0545 05/13/24 0355  WBC 8.5 6.9 9.1 9.6  NEUTROABS 5.9 4.8 6.8  --   HGB 10.1* 9.6* 10.2* 10.1*  HCT 32.4* 31.2* 32.6* 32.2*  MCV 113.3* 113.5* 111.6* 110.3*  PLT 268 257 274 252    Medications:     amLODipine   5 mg Oral QHS   aspirin  EC  81 mg Oral Daily   atorvastatin   20 mg Oral Daily   calcitRIOL   1.5 mcg Oral Q M,W,F-HD   capecitabine   500 mg Oral BID PC   Chlorhexidine  Gluconate Cloth  6 each Topical Q0600   cinacalcet   90 mg Oral Q breakfast   gabapentin   300 mg Oral QHS   heparin   5,000 Units Subcutaneous Q8H   isosorbide  mononitrate  15 mg Oral Daily   nebivolol   10 mg Oral BID   OLANZapine   5 mg Oral QHS   pantoprazole   40 mg Oral BID AC      Ephriam Stank, MD [facility] 05/13/2024, 9:11 AM

## 2024-05-13 NOTE — Consult Note (Signed)
 [facility] Consultation Oncology  Name: PRISCILLIA FOUCH      MRN: [id number]    Location: A317/A317-01  Date: 05/13/2024 Time:5:15 PM   REFERRING PHYSICIAN:  Dr.Shah  REASON FOR CONSULT:  Metastatic breast carcinoma   DIAGNOSIS:  Metastatic breast carcinoma  HISTORY OF PRESENT ILLNESS:   Patient is a 63 year old female with triple negative metastatic breast carcinoma very well-known to us  in the clinic.  She was admitted for shortness of breath and respiratory distress.  Chest x-ray at this time showed increased pulmonary metastasis and CT chest that was done also showed worsening metastatic disease with a probable new liver lesion.  I saw the patient at bedside along with her husband.  Discussed the above results with her and that this is consistent with progressive disease and even though she only got the previous treatment for a little while, I would consider changing treatment time.  Discussed that we will get a PET scan of patient, followed by starting on new treatment probably in 2 weeks based on when the patient gets discharged.  Patient today feels improved since the time of admission.  She is continuing her dialysis while inpatient.  PAST MEDICAL HISTORY:   Past Medical History:  Diagnosis Date   (HFpEF) heart failure with preserved ejection fraction (HCC)    a. 01/2019 Echo: EF 55-60%, mild conc LVH. DD.  Torn MV chordae.   Anemia    Atypical chest pain    a. 08/2018 MV: EF 59%, no ischemia; b. 02/2019 Cath: nonobs dzs.   Blood transfusion without reported diagnosis    Breast cancer (HCC) 10/12/2020   Cataract    ESRD (end stage renal disease) on dialysis [facility]    a. HD T, T, S   Essential hypertension, benign    GERD (gastroesophageal reflux disease)    Headache    Hemorrhoids    Mixed hyperlipidemia    Morbid obesity (HCC)    Non-obstructive CAD (coronary artery disease)    a. 02/2019 CathL LM nl, LAD 59m, LCX nl, RCA 25p, EF 55-65%.   PONV (postoperative  nausea and vomiting)    Renal insufficiency    S/P colonoscopy 08/2009   Dr. Rollin: sessile polyp (benign lymphoid), large hemorrhoids, repeat 5-10 years   Temporal arteritis (HCC)    Type 2 diabetes mellitus (HCC)    Wears glasses     ALLERGIES: Allergies  Allergen Reactions   Motrin [Ibuprofen] Other (See Comments)    ESRD       MEDICATIONS: I have reviewed the patient's current medications.     PAST SURGICAL HISTORY Past Surgical History:  Procedure Laterality Date   A/V SHUNT INTERVENTION Left 04/16/2024   Procedure: A/V SHUNT INTERVENTION;  Surgeon: Pearline Norman RAMAN, MD;  Location: [facility];  Service: Cardiovascular;  Laterality: Left;   ABDOMINAL HYSTERECTOMY     APPENDECTOMY     ARTERY BIOPSY N/A 05/09/2018   Procedure: RIGHT TEMPORAL ARTERY BIOPSY;  Surgeon: Kimble Agent, MD;  Location: [facility];  Service: General;  Laterality: N/A;   BIOPSY  10/04/2023   Procedure: BIOPSY;  Surgeon: Shaaron Lamar HERO, MD;  Location: [facility];  Service: Endoscopy;;   BREAST BIOPSY Right 06/15/2020   Procedure: RIGHT BREAST BIOPSY;  Surgeon: Mavis Anes, MD;  Location: [facility];  Service: General;  Laterality: Right;   CATARACT EXTRACTION W/PHACO Left 02/09/2017   Procedure: CATARACT EXTRACTION PHACO AND INTRAOCULAR LENS PLACEMENT LEFT EYE;  Surgeon: Perley Hamilton, MD;  Location: [facility];  Service: Ophthalmology;  Laterality: Left;  CDE: 4.89   CATARACT EXTRACTION W/PHACO Right 06/04/2017   Procedure: CATARACT EXTRACTION PHACO AND INTRAOCULAR LENS PLACEMENT (IOC);  Surgeon: Perley Hamilton, MD;  Location: [facility];  Service: Ophthalmology;  Laterality: Right;  CDE: 4.12   CHOLECYSTECTOMY  09/29/2011   Procedure: LAPAROSCOPIC CHOLECYSTECTOMY;  Surgeon: Oneil DELENA Budge, MD;  Location: [facility];  Service: General;  Laterality: N/A;   COLONOSCOPY  08/2009   Dr. Rollin: sessile polyp (benign lymphoid), large hemorrhoids, repeat 5-10 years   COLONOSCOPY N/A 06/12/2016   prominent hemorrhoids   COLONOSCOPY  WITH PROPOFOL  N/A 06/16/2021   Procedure: COLONOSCOPY WITH PROPOFOL ;  Surgeon: Shaaron Lamar HERO, MD;  [facility]: [facility];  Service: Endoscopy;  Laterality: N/A;  9:30am (dialysis pt)   ESOPHAGOGASTRODUODENOSCOPY  09/05/2011   MFM:Dfjoo hiatal hernia; remainder of exam normal. No explanation for patient's abdominal pain with today's examination   ESOPHAGOGASTRODUODENOSCOPY N/A 12/17/2013   Dr. Shaaron: gastric erythema, erosion, mild chronic inflammation on path    ESOPHAGOGASTRODUODENOSCOPY N/A 12/31/2023   Procedure: EGD (ESOPHAGOGASTRODUODENOSCOPY);  Surgeon: Wilhelmenia Aloha Raddle., MD;  Location: [facility];  Service: Gastroenterology;  Laterality: N/A;   ESOPHAGOGASTRODUODENOSCOPY (EGD) WITH PROPOFOL  N/A 06/16/2021   Procedure: ESOPHAGOGASTRODUODENOSCOPY (EGD) WITH PROPOFOL ;  Surgeon: Shaaron Lamar HERO, MD;  [facility]: [facility];  Service: Endoscopy;  Laterality: N/A;   ESOPHAGOGASTRODUODENOSCOPY (EGD) WITH PROPOFOL  N/A 10/04/2023   Procedure: ESOPHAGOGASTRODUODENOSCOPY (EGD) WITH PROPOFOL ;  Surgeon: Shaaron Lamar HERO, MD;  [facility]: [facility];  Service: Endoscopy;  Laterality: N/A;  200pm, ok rm 1-2, pt knows to arrive at 6:45   EUS N/A 12/31/2023   Procedure: ULTRASOUND, UPPER GI TRACT, ENDOSCOPIC;  Surgeon: Wilhelmenia Aloha Raddle., MD;  Location: [facility];  Service: Gastroenterology;  Laterality: N/A;   EXCISION OF BREAST BIOPSY Right 10/12/2020   Procedure: EXCISION OF RIGHT BREAST BIOPSY;  Surgeon: Budge Oneil, MD;  Location: [facility];  Service: General;  Laterality: Right;   IR DIALY SHUNT INTRO NEEDLE/INTRACATH INITIAL W/IMG LEFT Left 07/17/2023   LAPAROSCOPIC APPENDECTOMY  09/29/2011   Procedure: APPENDECTOMY LAPAROSCOPIC;  Surgeon: Oneil DELENA Budge, MD;  Location: [facility];  Service: General;;  incidental appendectomy   LEFT HEART CATH AND CORONARY ANGIOGRAPHY N/A 02/28/2019   Procedure: LEFT HEART CATH AND CORONARY ANGIOGRAPHY;  Surgeon: Dann Candyce RAMAN, MD;  Location: [facility]  INVASIVE CV LAB;  Service: Cardiovascular;  Laterality: N/A;   MASS EXCISION Right 01/18/2023   Procedure: EXCISION MASS, RIGHT CHEST S/P MASTECTOMY;  Surgeon: Budge Oneil, MD;  Location: [facility];  Service: General;  Laterality: Right;   MASTECTOMY MODIFIED RADICAL Right 02/18/2020   Procedure: MASTECTOMY MODIFIED RADICAL;  Surgeon: Budge Oneil, MD;  Location: [facility];  Service: General;  Laterality: Right;   MASTECTOMY, PARTIAL Right 07/13/2020   Procedure: RIGHT PARTIAL MASTECTOMY;  Surgeon: Budge Oneil, MD;  Location: [facility];  Service: General;  Laterality: Right;   PARTIAL MASTECTOMY WITH NEEDLE LOCALIZATION AND AXILLARY SENTINEL LYMPH NODE BX Right 09/18/2018   Procedure: RIGHT PARTIAL MASTECTOMY AFTER NEEDLE LOCALIZATION, SENTINEL LYMPH NODE BIOPSY RIGHT AXILLA;  Surgeon: Budge Oneil, MD;  Location: [facility];  Service: General;  Laterality: Right;   POLYPECTOMY  06/16/2021   Procedure: POLYPECTOMY;  Surgeon: Shaaron Lamar HERO, MD;  [facility];  Service: Endoscopy;;   PORT-A-CATH REMOVAL Right 11/29/2023   Procedure: REMOVAL PORT-A-CATH;  Surgeon: Budge Oneil, MD;  Location: [facility];  Service: General;  Laterality: Right;  MINOR PROCEDURE ROOM   PORTACATH PLACEMENT Right 06/07/2023  Procedure: INSERTION PORT-A-CATH, RIGHT  (DIALYSIS ACCESS ON LEFT);  Surgeon: Mavis Anes, MD;  Location: [facility];  Service: General;  Laterality: Right;   PORTACATH PLACEMENT Right 04/03/2024   Procedure: INSERTION, TUNNELED CENTRAL VENOUS DEVICE, WITH PORT;  Surgeon: Mavis Anes, MD;  Location: [facility];  Service: General;  Laterality: Right;   SIMPLE MASTECTOMY WITH AXILLARY SENTINEL NODE BIOPSY Left 06/15/2020   Procedure: LEFT SIMPLE MASTECTOMY;  Surgeon: Mavis Anes, MD;  Location: [facility];  Service: General;  Laterality: Left;    FAMILY HISTORY: Family History  Problem Relation Age of Onset   Hypertension Mother    Coronary artery disease Mother    Diabetes Mother    Heart attack  Father    Hypertension Sister    Coronary artery disease Sister    Hypertension Brother    Colon cancer Brother    Heart attack Maternal Grandmother    Heart attack Maternal Grandfather    Heart attack Paternal Grandmother    Heart attack Paternal Grandfather    Hypertension Son    Heart attack Maternal Aunt    Hypertension Maternal Aunt    Diabetes Maternal Aunt    Heart attack Maternal Uncle    Hypertension Maternal Uncle    Diabetes Maternal Uncle    Heart attack Paternal Aunt    Hypertension Paternal Aunt    Diabetes Paternal Aunt    Heart attack Paternal Uncle    Hypertension Paternal Uncle    Diabetes Paternal Uncle     SOCIAL HISTORY:  reports that she has never smoked. She has never been exposed to tobacco smoke. She has never used smokeless tobacco. She reports that she does not drink alcohol and does not use drugs.  PERFORMANCE STATUS: The patient's performance status is 2 - Symptomatic, <50% confined to bed  PHYSICAL EXAM: Most Recent Vital Signs: Blood pressure 105/68, pulse 87, temperature 98.2 F (36.8 C), resp. rate 18, height 5' 1.5 (1.562 m), weight 180 lb 5.4 oz (81.8 kg), SpO2 97%.  GENERAL:alert, no distress and comfortable, on nasal cannula NECK: supple, thyroid  normal size, non-tender, without nodularity LYMPH:  no palpable lymphadenopathy in the cervical, axillary or inguinal LUNGS: clear to auscultation and percussion with normal breathing effort HEART: regular rate & rhythm and no murmurs and no lower extremity edema ABDOMEN:abdomen soft, non-tender and normal bowel sounds Musculoskeletal:no cyanosis of digits and no clubbing  PSYCH: alert & oriented x 3 with fluent speech NEURO: no focal motor/sensory deficits   LABORATORY DATA:   Lab Results  Component Value Date   WBC 9.6 05/13/2024   HGB 10.1 (L) 05/13/2024   HCT 32.2 (L) 05/13/2024   MCV 110.3 (H) 05/13/2024   PLT 252 05/13/2024   Last metabolic panel Lab Results  Component Value  Date   GLUCOSE 88 05/13/2024   NA 138 05/13/2024   K 4.4 05/13/2024   CL 96 (L) 05/13/2024   CO2 27 05/13/2024   BUN 29 (H) 05/13/2024   CREATININE 8.07 (H) 05/13/2024   GFRNONAA 5 (L) 05/13/2024   CALCIUM  7.7 (L) 05/13/2024   PHOS 4.7 (H) 02/21/2021   PROT 6.8 05/12/2024   ALBUMIN 2.9 (L) 05/12/2024   BILITOT 0.7 05/12/2024   ALKPHOS 91 05/12/2024   AST 22 05/12/2024   ALT 16 05/12/2024   ANIONGAP 15 05/13/2024     RADIOGRAPHY: I reviewed the images personally and agree with the findings below  CT CHEST W CONTRAST Result Date: 05/12/2024 CLINICAL DATA:  Breast cancer, invasive, stage IV,  initial workup. * Tracking Code: BO * EXAM: CT CHEST WITH CONTRAST TECHNIQUE: Multidetector CT imaging of the chest was performed during intravenous contrast administration. RADIATION DOSE REDUCTION: This exam was performed according to the departmental dose-optimization program which includes automated exposure control, adjustment of the mA and/or kV according to patient size and/or use of iterative reconstruction technique. CONTRAST:  75mL OMNIPAQUE  IOHEXOL  300 MG/ML  SOLN COMPARISON:  Nuclear medicine PET scan from 01/31/2024 and CT scan chest from 01/24/2024. FINDINGS: Cardiovascular: Normal cardiac size. No pericardial effusion. No aortic aneurysm. There are coronary artery calcifications, in keeping with coronary artery disease. There are also mild peripheral atherosclerotic vascular calcifications of thoracic aorta and its major branches. Mediastinum/Nodes: Visualized thyroid  gland appears grossly unremarkable. No solid / cystic mediastinal masses. The esophagus is nondistended precluding optimal assessment. There multiple new/enlarging mediastinal, bilateral hilar and left axillary lymph nodes with largest in the left axilla and supra carinal region both measuring up to 1.8 x 2.8 cm, compatible with worsening metastases. No significant right axillary lymphadenopathy by size criteria. There are  multiple surgical staples in the right axillary region. Lungs/Pleura: Since the prior study there are extensive new solid pulmonary nodules ranging in size from few mm up to 5.9 cm, compatible with worsening metastatic disease. There is small-to-moderate right pleural effusion. No left pleural effusion. No pneumothorax on either side. There also peripheral subsegmental atelectatic changes in the middle lobe. Upper Abdomen: There is a new 1.0 x 1.2 cm ill-defined hypoattenuating lesion in the right hepatic lobe segment 6/7, which is incompletely characterized on the current exam but concerning for metastasis in the given clinical setting. Remaining visualized upper abdominal viscera within normal limits. Musculoskeletal: A CT Port-a-Cath is seen in the right upper chest wall with the catheter terminating in the lower portion of superior vena cava. Visualized soft tissues of the chest wall are otherwise grossly unremarkable. No suspicious osseous lesions. There are mild multilevel degenerative changes in the visualized spine. IMPRESSION: 1. Since the prior study, there are extensive new solid pulmonary nodules, mediastinal, bilateral hilar and left axillary lymphadenopathy, compatible with worsening metastatic disease. 2. There is also a new 1.0 x 1.2 cm ill-defined hypoattenuating lesion in the right hepatic lobe, which is incompletely characterized on the current exam but concerning for metastasis in the given clinical setting. 3. New small-to-moderate right pleural effusion. 4. Multiple other nonacute observations, as described above. Aortic Atherosclerosis (ICD10-I70.0). Electronically Signed   By: Ree Molt M.D.   On: 05/12/2024 15:25   DG Chest Port 1 View Result Date: 05/12/2024 CLINICAL DATA:  63 year old female with possible sepsis. Metastatic breast cancer. EXAM: PORTABLE CHEST 1 VIEW COMPARISON:  Portable chest 03/14/2024 and earlier. FINDINGS: Portable AP semi upright view at 0550 hours. Extensive  now moderate sized and relatively large rounded pulmonary masslike opacities throughout both lungs. Substantial progression compared to 04/03/2024. Associated streaky and confluent right perihilar opacity, left lung base opacity. Probable small pleural effusions. No pneumothorax. Stable right chest power port. Lung volumes and mediastinal contours not significantly changed. Stable visualized osseous structures.  Paucity of bowel gas. IMPRESSION: Portable radiographic appearance suggests severe progression of pulmonary metastatic disease since 04/03/2024. Some superimposed airspace opacity and probable small pleural effusions. Electronically Signed   By: VEAR Hurst M.D.   On: 05/12/2024 06:18       ASSESSMENT:  Patient is a 63 year old female with metastatic triple negative breast cancer on multiple lines of treatment previously.  Current CT scan with progressive disease.  PLAN:  1.  Breast cancer: I discussed the results of CT scan with the patient that is consistent with progressive disease.  Will consider change of treatment at this time but will be done on an outpatient basis.  -Will schedule for PET scan outpatient next week. - Continue currently capecitabine  500 mg twice a day 2 weeks on /1 week off - Will transition to sacituzumab govitecan outpatient and continue pembrolizumab   2.  Respiratory distress: Likely contributed by lung metastasis and component of volume overload  -Agree with management by primary team  3.  Diarrhea: Likely secondary to capecitabine  use  - Continue Imodium  and Lomotil  as prescribed - Gentle hydration as required   Thank you for taking care of the patient.  Will closely follow-up patient in establish outpatient follow-ups.  Please reach out with any questions or concerns  Mickiel Dry, MD Hematology/Oncology [facility]

## 2024-05-13 NOTE — Plan of Care (Signed)

## 2024-05-13 NOTE — Progress Notes (Signed)
 PROGRESS NOTE    Megan [name]  FMW:[id number] DOB: Oct 09, 1960 DOA: 05/12/2024 PCP: Duanne Butler DASEN, MD   Brief Narrative:    Megan [name] is a 62 y.o. female with medical history significant for ESRD on HD MWF, chronic diastolic CHF, breast cancer post bilateral mastectomy, hypertension, dyslipidemia, and GERD who states that she has been having ongoing shortness of breath over the last [redacted] weeks along with a dry cough that is nonproductive.  Patient was admitted with acute hypoxemic respiratory failure that appears multifactorial in the setting of bronchitis versus some atypical pneumonia as well as volume overload.  Overall she is improving after hemodialysis session on 9/29 and continues to remain on some IV antibiotics and is still requiring 3.5 L nasal cannula.  She is noted to have metastatic disease noted on CT chest and further oncology evaluation is pending.  Assessment & Plan:   Principal Problem:   Acute respiratory failure with hypoxia (HCC) Active Problems:   OBESITY   Essential hypertension, benign   GERD (gastroesophageal reflux disease)   ESRD (end stage renal disease) (HCC)   Acute diastolic CHF (congestive heart failure) (HCC)   Breast cancer (HCC)   Volume overload  Assessment and Plan:   Acute hypoxemic respiratory failure with dyspnea and cough-multifactorial - Likely in the setting of some volume overload with HFpEF versus some bronchitis and/or atypical pneumonia - Continue IV antibiotics with azithromycin  and Rocephin  for now with procalcitonin elevated - Check respiratory panel-pending - Breathing treatments as needed for cough or wheezing - Dialysis to assist with volume management, next session 10/1 - Wean to room air as tolerated, currently on 3.5 L nasal cannula   ESRD on HD MWF - Nephrology plans for further dialysis 10/1   Hypertension - Continue amlodipine , Imdur , and Nebivolol    Dyslipidemia - Continue statin   History of  breast cancer now with concern for metastasis - Noted prior mastectomy - Currently undergoing chemotherapy under supervision of Dr. Kandala - Chest x-ray concerning for progression of metastatic disease, now CT chest is confirming this -Appreciate further oncology evaluation while admitted   GERD - PPI   Obesity, class I - BMI 32.01     DVT prophylaxis: Heparin  Code Status: Full Family Communication: Husband at bedside 9/30 Disposition Plan:  Status is: Observation The patient will require care spanning > 2 midnights and should be moved to inpatient because: Need for IV medications.  Consultants:  Nephrology Oncology  Procedures:  None  Antimicrobials:  Anti-infectives (From admission, onward)    Start     Dose/Rate Route Frequency Ordered Stop   05/13/24 0900  azithromycin  (ZITHROMAX ) 500 mg in sodium chloride  0.9 % 250 mL IVPB        500 mg 250 mL/hr over 60 Minutes Intravenous Every 24 hours 05/13/24 0809     05/12/24 1400  cefTRIAXone  (ROCEPHIN ) 1 g in sodium chloride  0.9 % 100 mL IVPB        1 g 200 mL/hr over 30 Minutes Intravenous Every 24 hours 05/12/24 1259     05/12/24 0645  cefTRIAXone  (ROCEPHIN ) 1 g in sodium chloride  0.9 % 100 mL IVPB  Status:  Discontinued        1 g 200 mL/hr over 30 Minutes Intravenous  Once 05/12/24 0630 05/12/24 1259   05/12/24 0645  azithromycin  (ZITHROMAX ) 500 mg in sodium chloride  0.9 % 250 mL IVPB  Status:  Discontinued        500 mg 250 mL/hr over 60 Minutes  Intravenous  Once 05/12/24 0630 05/13/24 0809       Subjective: Patient seen and evaluated today with no new acute complaints or concerns. No acute concerns or events noted overnight.  Patient states that her shortness of breath is improving, but she still remains on 3.5 L nasal cannula oxygen .  Objective: Vitals:   05/12/24 2000 05/12/24 2023 05/13/24 0550 05/13/24 0827  BP: (!) 141/76 (!) 141/71 129/66 116/64  Pulse: 79 79 77 80  Resp: 17 18 20    Temp: 97.6 F (36.4  C) (!) 97.5 F (36.4 C) 98.9 F (37.2 C) 98.3 F (36.8 C)  TempSrc:  Oral Oral Oral  SpO2: 98% 93% 92% 98%  Weight: 81.8 kg     Height:        Intake/Output Summary (Last 24 hours) at 05/13/2024 1118 Last data filed at 05/13/2024 1012 Gross per 24 hour  Intake 658.62 ml  Output 1600 ml  Net -941.38 ml   Filed Weights   05/12/24 0532 05/12/24 1624 05/12/24 2000  Weight: 78.1 kg 84.1 kg 81.8 kg    Examination:  General exam: Appears calm and comfortable  Respiratory system: Diminished bilaterally. Respiratory effort normal.  3.5 L nasal cannula oxygen . Cardiovascular system: S1 & S2 heard, RRR.  Gastrointestinal system: Abdomen is soft Central nervous system: Alert and awake Extremities: Left arm chronic edema noted Skin: No significant lesions noted Psychiatry: Flat affect.    Data Reviewed: I have personally reviewed following labs and imaging studies  CBC: Recent Labs  Lab 05/06/24 1203 05/08/24 1005 05/12/24 0545 05/13/24 0355  WBC 8.5 6.9 9.1 9.6  NEUTROABS 5.9 4.8 6.8  --   HGB 10.1* 9.6* 10.2* 10.1*  HCT 32.4* 31.2* 32.6* 32.2*  MCV 113.3* 113.5* 111.6* 110.3*  PLT 268 257 274 252   Basic Metabolic Panel: Recent Labs  Lab 05/06/24 1203 05/08/24 1005 05/12/24 0545 05/13/24 0355  NA 138 139 141 138  K 3.8 3.3* 3.9 4.4  CL 94* 96* 98 96*  CO2 29 26 26 27   GLUCOSE 102* 124* 106* 88  BUN 28* 27* 47* 29*  CREATININE 10.06* 8.94* 12.06* 8.07*  CALCIUM  8.3* 8.5* 8.0* 7.7*  MG 2.2 2.2  --  2.0   GFR: Estimated Creatinine Clearance: 7.1 mL/min (A) (by C-G formula based on SCr of 8.07 mg/dL (H)). Liver Function Tests: Recent Labs  Lab 05/06/24 1203 05/08/24 1005 05/12/24 0545  AST 22 24 22   ALT 13 13 16   ALKPHOS 75 73 91  BILITOT 0.7 0.8 0.7  PROT 7.3 6.8 6.8  ALBUMIN 3.1* 3.0* 2.9*   No results for input(s): LIPASE, AMYLASE in the last 168 hours. No results for input(s): AMMONIA in the last 168 hours. Coagulation Profile: No  results for input(s): INR, PROTIME in the last 168 hours. Cardiac Enzymes: No results for input(s): CKTOTAL, CKMB, CKMBINDEX, TROPONINI in the last 168 hours. BNP (last 3 results) No results for input(s): PROBNP in the last 8760 hours. HbA1C: No results for input(s): HGBA1C in the last 72 hours. CBG: No results for input(s): GLUCAP in the last 168 hours. Lipid Profile: No results for input(s): CHOL, HDL, LDLCALC, TRIG, CHOLHDL, LDLDIRECT in the last 72 hours. Thyroid  Function Tests: No results for input(s): TSH, T4TOTAL, FREET4, T3FREE, THYROIDAB in the last 72 hours. Anemia Panel: No results for input(s): VITAMINB12, FOLATE, FERRITIN, TIBC, IRON , RETICCTPCT in the last 72 hours. Sepsis Labs: Recent Labs  Lab 05/12/24 0545 05/12/24 0706  PROCALCITON  --  0.90  LATICACIDVEN  1.4  --     Recent Results (from the past 240 hours)  Resp panel by RT-PCR (RSV, Flu A&B, Covid) Anterior Nasal Swab     Status: None   Collection Time: 05/12/24  5:40 AM   Specimen: Anterior Nasal Swab  Result Value Ref Range Status   SARS Coronavirus 2 by RT PCR NEGATIVE NEGATIVE Final    Comment: (NOTE) SARS-CoV-2 target nucleic acids are NOT DETECTED.  The SARS-CoV-2 RNA is generally detectable in upper respiratory specimens during the acute phase of infection. The lowest concentration of SARS-CoV-2 viral copies this assay can detect is 138 copies/mL. A negative result does not preclude SARS-Cov-2 infection and should not be used as the sole basis for treatment or other patient management decisions. A negative result may occur with  improper specimen collection/handling, submission of specimen other than nasopharyngeal swab, presence of viral mutation(s) within the areas targeted by this assay, and inadequate number of viral copies(<138 copies/mL). A negative result must be combined with clinical observations, patient history, and  epidemiological information. The expected result is Negative.  Fact Sheet for Patients:  BloggerCourse.com  Fact Sheet for Healthcare Providers:  SeriousBroker.it  This test is no t yet approved or cleared by the [place]  FDA and  has been authorized for detection and/or diagnosis of SARS-CoV-2 by FDA under an Emergency Use Authorization (EUA). This EUA will remain  in effect (meaning this test can be used) for the duration of the COVID-19 declaration under Section 564(b)(1) of the Act, 21 U.S.C.section 360bbb-3(b)(1), unless the authorization is terminated  or revoked sooner.       Influenza A by PCR NEGATIVE NEGATIVE Final   Influenza B by PCR NEGATIVE NEGATIVE Final    Comment: (NOTE) The Xpert Xpress SARS-CoV-2/FLU/RSV plus assay is intended as an aid in the diagnosis of influenza from Nasopharyngeal swab specimens and should not be used as a sole basis for treatment. Nasal washings and aspirates are unacceptable for Xpert Xpress SARS-CoV-2/FLU/RSV testing.  Fact Sheet for Patients: BloggerCourse.com  Fact Sheet for Healthcare Providers: SeriousBroker.it  This test is not yet approved or cleared by the [place]  FDA and has been authorized for detection and/or diagnosis of SARS-CoV-2 by FDA under an Emergency Use Authorization (EUA). This EUA will remain in effect (meaning this test can be used) for the duration of the COVID-19 declaration under Section 564(b)(1) of the Act, 21 U.S.C. section 360bbb-3(b)(1), unless the authorization is terminated or revoked.     Resp Syncytial Virus by PCR NEGATIVE NEGATIVE Final    Comment: (NOTE) Fact Sheet for Patients: BloggerCourse.com  Fact Sheet for Healthcare Providers: SeriousBroker.it  This test is not yet approved or cleared by the [place]  FDA and has been  authorized for detection and/or diagnosis of SARS-CoV-2 by FDA under an Emergency Use Authorization (EUA). This EUA will remain in effect (meaning this test can be used) for the duration of the COVID-19 declaration under Section 564(b)(1) of the Act, 21 U.S.C. section 360bbb-3(b)(1), unless the authorization is terminated or revoked.  Performed at [facility], [address], [place], [place] [address]   Blood Culture (routine x 2)     Status: None (Preliminary result)   Collection Time: 05/12/24  5:45 AM   Specimen: BLOOD  Result Value Ref Range Status   Specimen Description BLOOD RIGHT ANTECUBITAL  Final   Special Requests   Final    BOTTLES DRAWN AEROBIC AND ANAEROBIC Blood Culture adequate volume   Culture   Final  NO GROWTH 1 DAY Performed at [facility], [address] [place], [place] [address]    Report Status PENDING  Incomplete  Blood Culture (routine x 2)     Status: None (Preliminary result)   Collection Time: 05/12/24  6:54 AM   Specimen: BLOOD  Result Value Ref Range Status   Specimen Description BLOOD BLOOD RIGHT HAND  Final   Special Requests NONE  Final   Culture   Final    NO GROWTH < 24 HOURS Performed at [facility], [address] [place], [place] [address]    Report Status PENDING  Incomplete         Radiology Studies: CT CHEST W CONTRAST Result Date: 05/12/2024 CLINICAL DATA:  Breast cancer, invasive, stage IV, initial workup. * Tracking Code: BO * EXAM: CT CHEST WITH CONTRAST TECHNIQUE: Multidetector CT imaging of the chest was performed during intravenous contrast administration. RADIATION DOSE REDUCTION: This exam was performed according to the departmental dose-optimization program which includes automated exposure control, adjustment of the mA and/or kV according to patient size and/or use of iterative reconstruction technique. CONTRAST:  75mL OMNIPAQUE  IOHEXOL  300 MG/ML  SOLN COMPARISON:  Nuclear medicine PET scan from 01/31/2024 and CT scan  chest from 01/24/2024. FINDINGS: Cardiovascular: Normal cardiac size. No pericardial effusion. No aortic aneurysm. There are coronary artery calcifications, in keeping with coronary artery disease. There are also mild peripheral atherosclerotic vascular calcifications of thoracic aorta and its major branches. Mediastinum/Nodes: Visualized thyroid  gland appears grossly unremarkable. No solid / cystic mediastinal masses. The esophagus is nondistended precluding optimal assessment. There multiple new/enlarging mediastinal, bilateral hilar and left axillary lymph nodes with largest in the left axilla and supra carinal region both measuring up to 1.8 x 2.8 cm, compatible with worsening metastases. No significant right axillary lymphadenopathy by size criteria. There are multiple surgical staples in the right axillary region. Lungs/Pleura: Since the prior study there are extensive new solid pulmonary nodules ranging in size from few mm up to 5.9 cm, compatible with worsening metastatic disease. There is small-to-moderate right pleural effusion. No left pleural effusion. No pneumothorax on either side. There also peripheral subsegmental atelectatic changes in the middle lobe. Upper Abdomen: There is a new 1.0 x 1.2 cm ill-defined hypoattenuating lesion in the right hepatic lobe segment 6/7, which is incompletely characterized on the current exam but concerning for metastasis in the given clinical setting. Remaining visualized upper abdominal viscera within normal limits. Musculoskeletal: A CT Port-a-Cath is seen in the right upper chest wall with the catheter terminating in the lower portion of superior vena cava. Visualized soft tissues of the chest wall are otherwise grossly unremarkable. No suspicious osseous lesions. There are mild multilevel degenerative changes in the visualized spine. IMPRESSION: 1. Since the prior study, there are extensive new solid pulmonary nodules, mediastinal, bilateral hilar and left axillary  lymphadenopathy, compatible with worsening metastatic disease. 2. There is also a new 1.0 x 1.2 cm ill-defined hypoattenuating lesion in the right hepatic lobe, which is incompletely characterized on the current exam but concerning for metastasis in the given clinical setting. 3. New small-to-moderate right pleural effusion. 4. Multiple other nonacute observations, as described above. Aortic Atherosclerosis (ICD10-I70.0). Electronically Signed   By: Ree Molt M.D.   On: 05/12/2024 15:25   DG Chest Port 1 View Result Date: 05/12/2024 CLINICAL DATA:  63 year old female with possible sepsis. Metastatic breast cancer. EXAM: PORTABLE CHEST 1 VIEW COMPARISON:  Portable chest 03/14/2024 and earlier. FINDINGS: Portable AP semi upright view at 0550  hours. Extensive now moderate sized and relatively large rounded pulmonary masslike opacities throughout both lungs. Substantial progression compared to 04/03/2024. Associated streaky and confluent right perihilar opacity, left lung base opacity. Probable small pleural effusions. No pneumothorax. Stable right chest power port. Lung volumes and mediastinal contours not significantly changed. Stable visualized osseous structures.  Paucity of bowel gas. IMPRESSION: Portable radiographic appearance suggests severe progression of pulmonary metastatic disease since 04/03/2024. Some superimposed airspace opacity and probable small pleural effusions. Electronically Signed   By: VEAR Hurst M.D.   On: 05/12/2024 06:18        Scheduled Meds:  amLODipine   5 mg Oral QHS   aspirin  EC  81 mg Oral Daily   atorvastatin   20 mg Oral Daily   calcitRIOL   1.5 mcg Oral Q M,W,F-HD   capecitabine   500 mg Oral BID PC   Chlorhexidine  Gluconate Cloth  6 each Topical Q0600   cinacalcet   90 mg Oral Q breakfast   gabapentin   300 mg Oral QHS   heparin   5,000 Units Subcutaneous Q8H   isosorbide  mononitrate  15 mg Oral Daily   nebivolol   10 mg Oral BID   OLANZapine   5 mg Oral QHS    pantoprazole   40 mg Oral BID AC   Continuous Infusions:  azithromycin  Stopped (05/13/24 0957)   cefTRIAXone  (ROCEPHIN )  IV 1 g (05/12/24 2223)     LOS: 0 days    Time spent: 55 minutes    Rayen Palen D Maree, DO [facility]  If 7PM-7AM, please contact night-coverage [email or web address] 05/13/2024, 11:18 AM

## 2024-05-14 ENCOUNTER — Telehealth: Payer: Self-pay | Admitting: *Deleted

## 2024-05-14 ENCOUNTER — Other Ambulatory Visit: Payer: Self-pay | Admitting: *Deleted

## 2024-05-14 DIAGNOSIS — J9601 Acute respiratory failure with hypoxia: Secondary | ICD-10-CM | POA: Diagnosis not present

## 2024-05-14 DIAGNOSIS — N186 End stage renal disease: Secondary | ICD-10-CM | POA: Diagnosis not present

## 2024-05-14 DIAGNOSIS — Z992 Dependence on renal dialysis: Secondary | ICD-10-CM | POA: Diagnosis not present

## 2024-05-14 DIAGNOSIS — N040 Nephrotic syndrome with minor glomerular abnormality: Secondary | ICD-10-CM | POA: Diagnosis not present

## 2024-05-14 LAB — MAGNESIUM: Magnesium: 2.3 mg/dL (ref 1.7–2.4)

## 2024-05-14 LAB — RESPIRATORY PANEL BY PCR

## 2024-05-14 LAB — BASIC METABOLIC PANEL WITH GFR
Anion gap: 18 — ABNORMAL HIGH (ref 5–15)
BUN: 44 mg/dL — ABNORMAL HIGH (ref 8–23)
CO2: 24 mmol/L (ref 22–32)
Calcium: 8.3 mg/dL — ABNORMAL LOW (ref 8.9–10.3)
Chloride: 97 mmol/L — ABNORMAL LOW (ref 98–111)
Creatinine, Ser: 11.2 mg/dL — ABNORMAL HIGH (ref 0.44–1.00)
GFR, Estimated: 3 mL/min — ABNORMAL LOW (ref >60)
Glucose, Bld: 81 mg/dL (ref 70–99)
Potassium: 5 mmol/L (ref 3.5–5.1)
Sodium: 140 mmol/L (ref 135–145)

## 2024-05-14 LAB — CBC
HCT: 31.6 % — ABNORMAL LOW (ref 36.0–46.0)
Hemoglobin: 9.8 g/dL — ABNORMAL LOW (ref 12.0–15.0)
MCH: 35 pg — ABNORMAL HIGH (ref 26.0–34.0)
MCHC: 31 g/dL (ref 30.0–36.0)
MCV: 112.9 fL — ABNORMAL HIGH (ref 80.0–100.0)
Platelets: 260 K/uL (ref 150–400)
RBC: 2.8 MIL/uL — ABNORMAL LOW (ref 3.87–5.11)
RDW: 17.3 % — ABNORMAL HIGH (ref 11.5–15.5)
WBC: 9.3 K/uL (ref 4.0–10.5)
nRBC: 0 % (ref 0.0–0.2)

## 2024-05-14 MED ORDER — BENZONATATE 100 MG PO CAPS
200.0000 mg | ORAL_CAPSULE | Freq: Once | ORAL | Status: AC
  Administered 2024-05-14: 200 mg via ORAL
  Filled 2024-05-14: qty 2

## 2024-05-14 MED ORDER — PENTAFLUOROPROP-TETRAFLUOROETH EX AERO
INHALATION_SPRAY | CUTANEOUS | Status: AC
  Filled 2024-05-14: qty 30

## 2024-05-14 NOTE — Plan of Care (Signed)

## 2024-05-14 NOTE — Progress Notes (Signed)
 Mobility Specialist Progress Note:    05/14/24 0850  Mobility  Activity Ambulated with assistance  Level of Assistance Standby assist, set-up cues, supervision of patient - no hands on  Assistive Device Other (Comment) (1 HHA from husband)  Distance Ambulated (ft) 80 ft  Range of Motion/Exercises Active;All extremities  Activity Response Tolerated well  Mobility Referral Yes  Mobility visit 1 Mobility  Mobility Specialist Start Time (ACUTE ONLY) 0850  Mobility Specialist Stop Time (ACUTE ONLY) 0910  Mobility Specialist Time Calculation (min) (ACUTE ONLY) 20 min   Pt received in bed, MD requesting O2 test. Required supervision to stand and ambulate with 1 hand-held assist from husband. Tolerated well, SpO2 86% on RA at rest. SpO2 92% on 2L at rest, SpO2 89-94% on 2L during ambulation. Left sitting EOB, all needs met.  Loghan Kurtzman Mobility Specialist Please contact via Special educational needs teacher or  Rehab office at [phone]

## 2024-05-14 NOTE — Telephone Encounter (Signed)
 Per Dr. Davonna, patient notified to stop Xeloda  at this time.  Verbalized understanding.

## 2024-05-14 NOTE — Procedures (Signed)
 Received patient in bed to unit.  Alert and oriented.  Informed consent signed and in chart.  LLA AVF cannulated x 2 with 15g needles x 2 per policy, without difficulty. Secured well with tape. Tx initiated per MD order. UF goal 2500 ml. 1715 B/P down, 100 ml NS given. Pt denies c/o. 1723 B/P still down, UF reduced to 2200 ml, 100 ml  NS given. TX duration: 3 hours Tx complete. Blood returned. Needles removed, sites held x 2 until hemostasis achieved. Gauze changed prior to taping  Patient tolerated well.  Transported back to the room  Alert, without acute distress.  Hand-off given to patient's nurse.   Access used: LLA AVF Access issues: none  Total UF removed: 2000 Medication(s) given: See MAR   [facility] LITTIE [facility]

## 2024-05-14 NOTE — Progress Notes (Signed)
 PROGRESS NOTE    DORANN [name]  FMW:[id number] DOB: June 07, 1961 DOA: 05/12/2024 PCP: Duanne Butler DASEN, MD   Brief Narrative:    Megan [name] is a 63 y.o. female with medical history significant for ESRD on HD MWF, chronic diastolic CHF, breast cancer post bilateral mastectomy, hypertension, dyslipidemia, and GERD who states that she has been having ongoing shortness of breath over the last [redacted] weeks along with a dry cough that is nonproductive.  Patient was admitted with acute hypoxemic respiratory failure that appears multifactorial in the setting of bronchitis versus some atypical pneumonia as well as volume overload.  Overall she is improving after hemodialysis session on 9/29 and continues to remain on some IV antibiotics and is still requiring 3.5 L nasal cannula.  She is noted to have metastatic disease noted on CT chest and further oncology evaluation is pending.  Assessment & Plan:   Principal Problem:   Acute respiratory failure with hypoxia (HCC) Active Problems:   OBESITY   Essential hypertension, benign   GERD (gastroesophageal reflux disease)   ESRD (end stage renal disease) (HCC)   Acute diastolic CHF (congestive heart failure) (HCC)   Breast cancer (HCC)   Volume overload  Assessment and Plan:  1)Acute hypoxemic respiratory failure with dyspnea and cough-multifactorial - Likely in the setting of some volume overload with HFpEF versus some bronchitis and/or atypical pneumonia compounded by pulmonary metastases, mediastinal, hilar and axillary lymphadenopathy related to underlying breast cancer with pulmonary metastases - Continue IV  azithromycin  and Rocephin  for now with procalcitonin elevated -Respiratory panel-Neg PCT 0.9 - Breathing treatments as needed for cough or wheezing - Dialysis to assist with volume management -Unable to wean off O2 at this time hypoxia and dyspnea on exertion persist currently requiring 2 to 3 L of oxygen  via nasal cannula      2)ESRD on HD MWF - -Nephrology consult appreciated -Continue to use hemodialysis sessions to address volume status   Hypertension - Continue amlodipine , Imdur , and Nebivolol    Dyslipidemia - Continue statin   History of breast cancer now with metastasis---pulmonary metastases, mediastinal, hilar and axillary lymphadenopathy related to underlying breast cancer with pulmonary metastases. - Noted prior mastectomy - Currently undergoing chemotherapy under supervision of Dr. Davonna -Appreciate further oncology evaluation   GERD - c/n PPI   Obesity, class I - BMI 32.01     DVT prophylaxis: Heparin  Code Status: Full Family Communication: Husband at bedside  Disposition Plan: home with [facility]   Consultants:  Nephrology Oncology  Antimicrobials:  Anti-infectives (From admission, onward)    Start     Dose/Rate Route Frequency Ordered Stop   05/13/24 0900  azithromycin  (ZITHROMAX ) 500 mg in sodium chloride  0.9 % 250 mL IVPB        500 mg 250 mL/hr over 60 Minutes Intravenous Every 24 hours 05/13/24 0809     05/12/24 1400  cefTRIAXone  (ROCEPHIN ) 1 g in sodium chloride  0.9 % 100 mL IVPB        1 g 200 mL/hr over 30 Minutes Intravenous Every 24 hours 05/12/24 1259     05/12/24 0645  cefTRIAXone  (ROCEPHIN ) 1 g in sodium chloride  0.9 % 100 mL IVPB  Status:  Discontinued        1 g 200 mL/hr over 30 Minutes Intravenous  Once 05/12/24 0630 05/12/24 1259   05/12/24 0645  azithromycin  (ZITHROMAX ) 500 mg in sodium chloride  0.9 % 250 mL IVPB  Status:  Discontinued        500 mg 250  mL/hr over 60 Minutes Intravenous  Once 05/12/24 0630 05/13/24 0809       Subjective: - Dyspnea on exertion and hypoxia persist - Undergoing hemodialysis on 05/14/2024 -Husband at bedside, questions answered No fever  Or chills  --- Cough persist  Objective: Vitals:   05/14/24 1800 05/14/24 1830 05/14/24 1841 05/14/24 1846  BP: 106/62 108/61 116/68 111/66  Pulse: 80 82 91 80  Resp: 18 (!) 26 (!) 23  18  Temp:    98.4 F (36.9 C)  TempSrc:    Oral  SpO2: 98% 98% (!) 89% 100%  Weight:    81.9 kg  Height:        Intake/Output Summary (Last 24 hours) at 05/14/2024 1858 Last data filed at 05/14/2024 1841 Gross per 24 hour  Intake --  Output 2200 ml  Net -2200 ml   Filed Weights   05/12/24 2000 05/14/24 1520 05/14/24 1846  Weight: 81.8 kg 83.9 kg 81.9 kg    Examination:  General exam: Appears calm and comfortable  NOSE---[place]-3.5 L per nasal cannula Respiratory system: Diminished breath sounds, no significant wheezing,  cardiovascular system: S1 & S2 heard, RRR.  Gastrointestinal system: Abdomen is soft Central nervous system: Alert and awake Extremities: Left arm chronic edema noted Skin: No significant lesions noted Psychiatry: Flat affect.    Data Reviewed: I have personally reviewed following labs and imaging studies  CBC: Recent Labs  Lab 05/08/24 1005 05/12/24 0545 05/13/24 0355 05/14/24 0418  WBC 6.9 9.1 9.6 9.3  NEUTROABS 4.8 6.8  --   --   HGB 9.6* 10.2* 10.1* 9.8*  HCT 31.2* 32.6* 32.2* 31.6*  MCV 113.5* 111.6* 110.3* 112.9*  PLT 257 274 252 260   Basic Metabolic Panel: Recent Labs  Lab 05/08/24 1005 05/12/24 0545 05/13/24 0355 05/14/24 0418  NA 139 141 138 140  K 3.3* 3.9 4.4 5.0  CL 96* 98 96* 97*  CO2 26 26 27 24   GLUCOSE 124* 106* 88 81  BUN 27* 47* 29* 44*  CREATININE 8.94* 12.06* 8.07* 11.20*  CALCIUM  8.5* 8.0* 7.7* 8.3*  MG 2.2  --  2.0 2.3   GFR: Estimated Creatinine Clearance: 5.1 mL/min (A) (by C-G formula based on SCr of 11.2 mg/dL (H)). Liver Function Tests: Recent Labs  Lab 05/08/24 1005 05/12/24 0545  AST 24 22  ALT 13 16  ALKPHOS 73 91  BILITOT 0.8 0.7  PROT 6.8 6.8  ALBUMIN 3.0* 2.9*   Sepsis Labs: Recent Labs  Lab 05/12/24 0545 05/12/24 0706  PROCALCITON  --  0.90  LATICACIDVEN 1.4  --     Recent Results (from the past 240 hours)  Resp panel by RT-PCR (RSV, Flu A&B, Covid) Anterior Nasal Swab     Status:  None   Collection Time: 05/12/24  5:40 AM   Specimen: Anterior Nasal Swab  Result Value Ref Range Status   SARS Coronavirus 2 by RT PCR NEGATIVE NEGATIVE Final    Comment: (NOTE) SARS-CoV-2 target nucleic acids are NOT DETECTED.  The SARS-CoV-2 RNA is generally detectable in upper respiratory specimens during the acute phase of infection. The lowest concentration of SARS-CoV-2 viral copies this assay can detect is 138 copies/mL. A negative result does not preclude SARS-Cov-2 infection and should not be used as the sole basis for treatment or other patient management decisions. A negative result may occur with  improper specimen collection/handling, submission of specimen other than nasopharyngeal swab, presence of viral mutation(s) within the areas targeted by this assay, and inadequate  number of viral copies(<138 copies/mL). A negative result must be combined with clinical observations, patient history, and epidemiological information. The expected result is Negative.  Fact Sheet for Patients:  BloggerCourse.com  Fact Sheet for Healthcare Providers:  SeriousBroker.it  This test is no t yet approved or cleared by the [place]  FDA and  has been authorized for detection and/or diagnosis of SARS-CoV-2 by FDA under an Emergency Use Authorization (EUA). This EUA will remain  in effect (meaning this test can be used) for the duration of the COVID-19 declaration under Section 564(b)(1) of the Act, 21 U.S.C.section 360bbb-3(b)(1), unless the authorization is terminated  or revoked sooner.       Influenza A by PCR NEGATIVE NEGATIVE Final   Influenza B by PCR NEGATIVE NEGATIVE Final    Comment: (NOTE) The Xpert Xpress SARS-CoV-2/FLU/RSV plus assay is intended as an aid in the diagnosis of influenza from Nasopharyngeal swab specimens and should not be used as a sole basis for treatment. Nasal washings and aspirates are unacceptable  for Xpert Xpress SARS-CoV-2/FLU/RSV testing.  Fact Sheet for Patients: BloggerCourse.com  Fact Sheet for Healthcare Providers: SeriousBroker.it  This test is not yet approved or cleared by the [place]  FDA and has been authorized for detection and/or diagnosis of SARS-CoV-2 by FDA under an Emergency Use Authorization (EUA). This EUA will remain in effect (meaning this test can be used) for the duration of the COVID-19 declaration under Section 564(b)(1) of the Act, 21 U.S.C. section 360bbb-3(b)(1), unless the authorization is terminated or revoked.     Resp Syncytial Virus by PCR NEGATIVE NEGATIVE Final    Comment: (NOTE) Fact Sheet for Patients: BloggerCourse.com  Fact Sheet for Healthcare Providers: SeriousBroker.it  This test is not yet approved or cleared by the [place]  FDA and has been authorized for detection and/or diagnosis of SARS-CoV-2 by FDA under an Emergency Use Authorization (EUA). This EUA will remain in effect (meaning this test can be used) for the duration of the COVID-19 declaration under Section 564(b)(1) of the Act, 21 U.S.C. section 360bbb-3(b)(1), unless the authorization is terminated or revoked.  Performed at [facility], [address] [place], [place] [address]   Blood Culture (routine x 2)     Status: None (Preliminary result)   Collection Time: 05/12/24  5:45 AM   Specimen: BLOOD  Result Value Ref Range Status   Specimen Description BLOOD RIGHT ANTECUBITAL  Final   Special Requests   Final    BOTTLES DRAWN AEROBIC AND ANAEROBIC Blood Culture adequate volume   Culture   Final    NO GROWTH 2 DAYS Performed at [facility], [address], [place], [place] [address]    Report Status PENDING  Incomplete  Blood Culture (routine x 2)     Status: None (Preliminary result)   Collection Time: 05/12/24  6:54 AM   Specimen: BLOOD  Result  Value Ref Range Status   Specimen Description BLOOD BLOOD RIGHT HAND  Final   Special Requests NONE  Final   Culture   Final    NO GROWTH 2 DAYS Performed at [facility], [address], [place], [place] [address]    Report Status PENDING  Incomplete  Respiratory (~20 pathogens) panel by PCR     Status: None   Collection Time: 05/12/24  9:02 AM   Specimen: Nasopharyngeal Swab; Respiratory  Result Value Ref Range Status   Adenovirus NOT DETECTED NOT DETECTED Final   Coronavirus 229E NOT DETECTED NOT DETECTED Final    Comment: (  NOTE) The Coronavirus on the Respiratory Panel, DOES NOT test for the novel  Coronavirus (2019 nCoV)    Coronavirus HKU1 NOT DETECTED NOT DETECTED Final   Coronavirus NL63 NOT DETECTED NOT DETECTED Final   Coronavirus OC43 NOT DETECTED NOT DETECTED Final   Metapneumovirus NOT DETECTED NOT DETECTED Final   Rhinovirus / Enterovirus NOT DETECTED NOT DETECTED Final   Influenza A NOT DETECTED NOT DETECTED Final   Influenza B NOT DETECTED NOT DETECTED Final   Parainfluenza Virus 1 NOT DETECTED NOT DETECTED Final   Parainfluenza Virus 2 NOT DETECTED NOT DETECTED Final   Parainfluenza Virus 3 NOT DETECTED NOT DETECTED Final   Parainfluenza Virus 4 NOT DETECTED NOT DETECTED Final   Respiratory Syncytial Virus NOT DETECTED NOT DETECTED Final   Bordetella pertussis NOT DETECTED NOT DETECTED Final   Bordetella Parapertussis NOT DETECTED NOT DETECTED Final   Chlamydophila pneumoniae NOT DETECTED NOT DETECTED Final   Mycoplasma pneumoniae NOT DETECTED NOT DETECTED Final    Comment: Performed at [facility], [address] [place], [place] [address]   Scheduled Meds:  amLODipine   5 mg Oral QHS   aspirin  EC  81 mg Oral Daily   atorvastatin   20 mg Oral Daily   calcitRIOL   1.5 mcg Oral Q M,W,F-HD   capecitabine   500 mg Oral BID PC   Chlorhexidine  Gluconate Cloth  6 each Topical Q0600   cinacalcet   90 mg Oral Q breakfast   gabapentin   300 mg Oral QHS   heparin    5,000 Units Subcutaneous Q8H   isosorbide  mononitrate  15 mg Oral Daily   nebivolol   10 mg Oral BID   OLANZapine   5 mg Oral QHS   pantoprazole   40 mg Oral BID AC   Continuous Infusions:  azithromycin  500 mg (05/14/24 0933)   cefTRIAXone  (ROCEPHIN )  IV 1 g (05/14/24 1329)     LOS: 1 day   Rendall Carwin, MD [facility]  If 7PM-7AM, please contact night-coverage [email or web address] 05/14/2024, 6:58 PM

## 2024-05-14 NOTE — Progress Notes (Signed)
 [name] Progress Note    Assessment/ Plan:   ESRD  -outpatient HD orders: [facility]. MWF.  3 hours 15 minutes.  EDW 79.9 kg.  2K/2.5 calcium .  aVF, 15G.  Flow rates: 400/500.  Heparin : None.  Meds: Aranesp  160 mcg once weekly (last dose 9/26), Venofer  50 mg once weekly, calcitriol  1.5 mcg every treatment -HD today  per her MWF schedule; she requested to be 1st shift but unfortunately 2 patients were already called.   SOB -secondary to worsening metastatic disease +/- pneumonia -mgmt+abx per primary service--currently on rocephin  and azithro   Volume/ hypertension  -EDW 79.7kg. UF as tolerated.  Hopefully she tolerates UF today and hospitalist to rechallenge to see if she can come off the O2 as she is not on O2 at home.   Anemia of Chronic Kidney Disease -Hemoglobin 10.2. Hold ESA moving forward in the context of active malignancy+chemo unless cleared by oncology -Transfuse PRN for Hgb <7   Secondary Hyperparathyroidism/Hyperphosphatemia - resume home meds, monitor phos, will resume calcitriol  with HD    Vascular access Chronic LUE swelling -s/p recent fistulogram on 9/3 for chronic left arm swelling: found to have widely patent system, swelling likely related to lymph node mets  Breast Ca with mets -oncology following peripherally     Subjective:   Patient seen and examined bedside. Husband at bedside. Net UF Monday with HD: ~1.6L She reports that cough is better, still has residual rib pain; dizziness when ambulating down the hall this morning and still O2 dependent.     Objective:   BP (!) 112/57 (BP Location: Right Arm)   Pulse 74   Temp 98.4 F (36.9 C) (Oral)   Resp 18   Ht 5' 1.5 (1.562 m)   Wt 81.8 kg   SpO2 96%   BMI 33.52 kg/m   Intake/Output Summary (Last 24 hours) at 05/14/2024 0935 Last data filed at 05/13/2024 1608 Gross per 24 hour  Intake 507.04 ml  Output --  Net 507.04 ml   Weight change:   Physical  Exam: Gen: NAD CVS: RRR Resp: decreased breath sounds bibasilar, no w/r/r/c appreciated, unlabored, normal wob Abd: soft, NT/ND Ext: no sig edema b/l Les, LUE swelling (chronic)-slightly improved Neuro: awake, alert Dialysis access: LUE AVF +b/t  Imaging: CT CHEST W CONTRAST Result Date: 05/12/2024 CLINICAL DATA:  Breast cancer, invasive, stage IV, initial workup. * Tracking Code: BO * EXAM: CT CHEST WITH CONTRAST TECHNIQUE: Multidetector CT imaging of the chest was performed during intravenous contrast administration. RADIATION DOSE REDUCTION: This exam was performed according to the departmental dose-optimization program which includes automated exposure control, adjustment of the mA and/or kV according to patient size and/or use of iterative reconstruction technique. CONTRAST:  75mL OMNIPAQUE  IOHEXOL  300 MG/ML  SOLN COMPARISON:  Nuclear medicine PET scan from 01/31/2024 and CT scan chest from 01/24/2024. FINDINGS: Cardiovascular: Normal cardiac size. No pericardial effusion. No aortic aneurysm. There are coronary artery calcifications, in keeping with coronary artery disease. There are also mild peripheral atherosclerotic vascular calcifications of thoracic aorta and its major branches. Mediastinum/Nodes: Visualized thyroid  gland appears grossly unremarkable. No solid / cystic mediastinal masses. The esophagus is nondistended precluding optimal assessment. There multiple new/enlarging mediastinal, bilateral hilar and left axillary lymph nodes with largest in the left axilla and supra carinal region both measuring up to 1.8 x 2.8 cm, compatible with worsening metastases. No significant right axillary lymphadenopathy by size criteria. There are multiple surgical staples in the right axillary region. Lungs/Pleura:  Since the prior study there are extensive new solid pulmonary nodules ranging in size from few mm up to 5.9 cm, compatible with worsening metastatic disease. There is small-to-moderate right  pleural effusion. No left pleural effusion. No pneumothorax on either side. There also peripheral subsegmental atelectatic changes in the middle lobe. Upper Abdomen: There is a new 1.0 x 1.2 cm ill-defined hypoattenuating lesion in the right hepatic lobe segment 6/7, which is incompletely characterized on the current exam but concerning for metastasis in the given clinical setting. Remaining visualized upper abdominal viscera within normal limits. Musculoskeletal: A CT Port-a-Cath is seen in the right upper chest wall with the catheter terminating in the lower portion of superior vena cava. Visualized soft tissues of the chest wall are otherwise grossly unremarkable. No suspicious osseous lesions. There are mild multilevel degenerative changes in the visualized spine. IMPRESSION: 1. Since the prior study, there are extensive new solid pulmonary nodules, mediastinal, bilateral hilar and left axillary lymphadenopathy, compatible with worsening metastatic disease. 2. There is also a new 1.0 x 1.2 cm ill-defined hypoattenuating lesion in the right hepatic lobe, which is incompletely characterized on the current exam but concerning for metastasis in the given clinical setting. 3. New small-to-moderate right pleural effusion. 4. Multiple other nonacute observations, as described above. Aortic Atherosclerosis (ICD10-I70.0). Electronically Signed   By: Ree Molt M.D.   On: 05/12/2024 15:25    Labs: BMET Recent Labs  Lab 05/08/24 1005 05/12/24 0545 05/13/24 0355 05/14/24 0418  NA 139 141 138 140  K 3.3* 3.9 4.4 5.0  CL 96* 98 96* 97*  CO2 26 26 27 24   GLUCOSE 124* 106* 88 81  BUN 27* 47* 29* 44*  CREATININE 8.94* 12.06* 8.07* 11.20*  CALCIUM  8.5* 8.0* 7.7* 8.3*   CBC Recent Labs  Lab 05/08/24 1005 05/12/24 0545 05/13/24 0355 05/14/24 0418  WBC 6.9 9.1 9.6 9.3  NEUTROABS 4.8 6.8  --   --   HGB 9.6* 10.2* 10.1* 9.8*  HCT 31.2* 32.6* 32.2* 31.6*  MCV 113.5* 111.6* 110.3* 112.9*  PLT 257 274  252 260    Medications:     amLODipine   5 mg Oral QHS   aspirin  EC  81 mg Oral Daily   atorvastatin   20 mg Oral Daily   benzonatate   200 mg Oral Once   calcitRIOL   1.5 mcg Oral Q M,W,F-HD   capecitabine   500 mg Oral BID PC   Chlorhexidine  Gluconate Cloth  6 each Topical Q0600   cinacalcet   90 mg Oral Q breakfast   gabapentin   300 mg Oral QHS   heparin   5,000 Units Subcutaneous Q8H   isosorbide  mononitrate  15 mg Oral Daily   nebivolol   10 mg Oral BID   OLANZapine   5 mg Oral QHS   pantoprazole   40 mg Oral BID AC

## 2024-05-14 NOTE — Plan of Care (Signed)
   Problem: Education: Goal: Knowledge of General Education information will improve Description: Including pain rating scale, medication(s)/side effects and non-pharmacologic comfort measures Outcome: Progressing   Problem: Clinical Measurements: Goal: Ability to maintain clinical measurements within normal limits will improve Outcome: Progressing Goal: Diagnostic test results will improve Outcome: Progressing

## 2024-05-15 DIAGNOSIS — J9601 Acute respiratory failure with hypoxia: Secondary | ICD-10-CM | POA: Diagnosis not present

## 2024-05-15 MED ORDER — ALBUTEROL SULFATE (2.5 MG/3ML) 0.083% IN NEBU
2.5000 mg | INHALATION_SOLUTION | RESPIRATORY_TRACT | 2 refills | Status: AC | PRN
Start: 2024-05-15 — End: 2025-05-15

## 2024-05-15 MED ORDER — ALBUTEROL SULFATE HFA 108 (90 BASE) MCG/ACT IN AERS: 2.0000 | INHALATION_SPRAY | Freq: Four times a day (QID) | RESPIRATORY_TRACT | 2 refills | Status: DC | PRN

## 2024-05-15 MED ORDER — CEFDINIR 300 MG PO CAPS: 300.0000 mg | ORAL_CAPSULE | Freq: Two times a day (BID) | ORAL | 0 refills | Status: AC

## 2024-05-15 MED ORDER — GUAIFENESIN ER 600 MG PO TB12: 600.0000 mg | ORAL_TABLET | Freq: Two times a day (BID) | ORAL | 2 refills | Status: DC

## 2024-05-15 MED ORDER — ALBUTEROL SULFATE (2.5 MG/3ML) 0.083% IN NEBU: 2.5000 mg | INHALATION_SOLUTION | RESPIRATORY_TRACT | 2 refills | Status: DC | PRN

## 2024-05-15 MED ORDER — GUAIFENESIN-CODEINE 100-10 MG/5ML PO SOLN: 10.0000 mL | Freq: Three times a day (TID) | ORAL | 0 refills | Status: DC | PRN

## 2024-05-15 MED ORDER — AZITHROMYCIN 500 MG PO TABS: 500.0000 mg | ORAL_TABLET | Freq: Every day | ORAL | 0 refills | Status: AC

## 2024-05-15 MED ORDER — ALBUTEROL SULFATE HFA 108 (90 BASE) MCG/ACT IN AERS: 2.0000 | INHALATION_SPRAY | Freq: Four times a day (QID) | RESPIRATORY_TRACT | 2 refills | Status: AC | PRN

## 2024-05-15 MED ORDER — PREDNISONE 20 MG PO TABS: 40.0000 mg | ORAL_TABLET | Freq: Every day | ORAL | 0 refills | Status: AC

## 2024-05-15 MED ORDER — COMPRESSOR/NEBULIZER MISC: 1.0000 [IU] | Freq: Every day | 0 refills | Status: DC | PRN

## 2024-05-15 MED ORDER — COMPRESSOR/NEBULIZER MISC: 1.0000 [IU] | Freq: Every day | 0 refills | Status: AC | PRN

## 2024-05-15 NOTE — Plan of Care (Signed)

## 2024-05-15 NOTE — Progress Notes (Signed)
 D/c orders noted. Contacted out-pt HD clinic, [facility] [place], to inform of pt d/c and anticipated arrival back tomorrow at clinic to resume schedule. No further support needed at this time.   Lavanda Cerys [facility] navigator [phone]

## 2024-05-15 NOTE — Discharge Summary (Signed)
 Megan [name], is a 63 y.o. female  DOB May 15, 1961  MRN [id number].  Admission date:  05/12/2024  Admitting Physician  Mauricio Toribio Furnace, MD  Discharge Date:  05/15/2024   Primary MD  Duanne Butler DASEN, MD  Recommendations for primary care physician for things to follow:  1)Very Low-salt diet advised---Less than 2 gm of Sodium per day advised----ok to use Mrs DASH salt substitute instead of Salt 2)Weigh yourself daily, call if you gain more than 3 pounds in 1 day or more than 5 pounds in 1 week as your hemodialysis schedule may need to be adjusted 3)Limit your Fluid  intake to No more than 50 ounces (1.5 Liters) per day 4)Please follow-up with hematologist/oncologist Dr. Davonna, MD within 1 week -Address: inside [facility] (4th Floor), [address] [place], [place], [place] [address] Phone: [phone] 5)Please Note several changes to your medications--- 6)you need oxygen  at home at 2 L via nasal cannula continuously while awake and while asleep--- smoking or having open fires around oxygen  can cause fire, significant injury and death  Admission Diagnosis  Volume overload [E87.70] Acute respiratory failure with hypoxia (HCC) [J96.01] Multifocal pneumonia [J18.8]   Discharge Diagnosis  Volume overload [E87.70] Acute respiratory failure with hypoxia (HCC) [J96.01] Multifocal pneumonia [J18.8]    Principal Problem:   Acute respiratory failure with hypoxia (HCC) Active Problems:   OBESITY   Essential hypertension, benign   GERD (gastroesophageal reflux disease)   ESRD (end stage renal disease) (HCC)   Acute diastolic CHF (congestive heart failure) (HCC)   Breast cancer (HCC)   Volume overload      Past Medical History:  Diagnosis Date   (HFpEF) heart failure with preserved ejection fraction (HCC)    a. 01/2019 Echo: EF 55-60%, mild conc LVH. DD.  Torn MV chordae.   Anemia    Atypical chest  pain    a. 08/2018 MV: EF 59%, no ischemia; b. 02/2019 Cath: nonobs dzs.   Blood transfusion without reported diagnosis    Breast cancer (HCC) 10/12/2020   Cataract    ESRD (end stage renal disease) on dialysis [facility])    a. HD T, T, S   Essential hypertension, benign    GERD (gastroesophageal reflux disease)    Headache    Hemorrhoids    Mixed hyperlipidemia    Morbid obesity (HCC)    Non-obstructive CAD (coronary artery disease)    a. 02/2019 CathL LM nl, LAD 65m, LCX nl, RCA 25p, EF 55-65%.   PONV (postoperative nausea and vomiting)    Renal insufficiency    S/P colonoscopy 08/2009   Dr. Rollin: sessile polyp (benign lymphoid), large hemorrhoids, repeat 5-10 years   Temporal arteritis (HCC)    Type 2 diabetes mellitus (HCC)    Wears glasses     Past Surgical History:  Procedure Laterality Date   A/V SHUNT INTERVENTION Left 04/16/2024   Procedure: A/V SHUNT INTERVENTION;  Surgeon: Pearline Norman RAMAN, MD;  Location: [facility];  Service: Cardiovascular;  Laterality: Left;   ABDOMINAL  HYSTERECTOMY     APPENDECTOMY     ARTERY BIOPSY N/A 05/09/2018   Procedure: RIGHT TEMPORAL ARTERY BIOPSY;  Surgeon: Kimble Agent, MD;  Location: [facility] OR;  Service: General;  Laterality: N/A;   BIOPSY  10/04/2023   Procedure: BIOPSY;  Surgeon: Shaaron Lamar HERO, MD;  Location: [facility];  Service: Endoscopy;;   BREAST BIOPSY Right 06/15/2020   Procedure: RIGHT BREAST BIOPSY;  Surgeon: Mavis Anes, MD;  Location: [facility]S;  Service: General;  Laterality: Right;   CATARACT EXTRACTION W/PHACO Left 02/09/2017   Procedure: CATARACT EXTRACTION PHACO AND INTRAOCULAR LENS PLACEMENT LEFT EYE;  Surgeon: Perley Hamilton, MD;  Location: [facility];  Service: Ophthalmology;  Laterality: Left;  CDE: 4.89   CATARACT EXTRACTION W/PHACO Right 06/04/2017   Procedure: CATARACT EXTRACTION PHACO AND INTRAOCULAR LENS PLACEMENT (IOC);  Surgeon: Perley Hamilton, MD;  Location: [facility];  Service: Ophthalmology;  Laterality: Right;  CDE: 4.12    CHOLECYSTECTOMY  09/29/2011   Procedure: LAPAROSCOPIC CHOLECYSTECTOMY;  Surgeon: Anes DELENA Mavis, MD;  Location: [facility];  Service: General;  Laterality: N/A;   COLONOSCOPY  08/2009   Dr. Rollin: sessile polyp (benign lymphoid), large hemorrhoids, repeat 5-10 years   COLONOSCOPY N/A 06/12/2016   prominent hemorrhoids   COLONOSCOPY WITH PROPOFOL  N/A 06/16/2021   Procedure: COLONOSCOPY WITH PROPOFOL ;  Surgeon: Shaaron Lamar HERO, MD;  Location: [facility];  Service: Endoscopy;  Laterality: N/A;  9:30am (dialysis pt)   ESOPHAGOGASTRODUODENOSCOPY  09/05/2011   MFM:Dfjoo hiatal hernia; remainder of exam normal. No explanation for patient's abdominal pain with today's examination   ESOPHAGOGASTRODUODENOSCOPY N/A 12/17/2013   Dr. Shaaron: gastric erythema, erosion, mild chronic inflammation on path    ESOPHAGOGASTRODUODENOSCOPY N/A 12/31/2023   Procedure: EGD (ESOPHAGOGASTRODUODENOSCOPY);  Surgeon: Wilhelmenia Aloha Raddle., MD;  Location: [facility];  Service: Gastroenterology;  Laterality: N/A;   ESOPHAGOGASTRODUODENOSCOPY (EGD) WITH PROPOFOL  N/A 06/16/2021   Procedure: ESOPHAGOGASTRODUODENOSCOPY (EGD) WITH PROPOFOL ;  Surgeon: Shaaron Lamar HERO, MD;  [facility];  Service: Endoscopy;  Laterality: N/A;   ESOPHAGOGASTRODUODENOSCOPY (EGD) WITH PROPOFOL  N/A 10/04/2023   Procedure: ESOPHAGOGASTRODUODENOSCOPY (EGD) WITH PROPOFOL ;  Surgeon: Shaaron Lamar HERO, MD;  [facility]: [facility];  Service: Endoscopy;  Laterality: N/A;  200pm, ok rm 1-2, pt knows to arrive at 6:45   EUS N/A 12/31/2023   Procedure: ULTRASOUND, UPPER GI TRACT, ENDOSCOPIC;  Surgeon: Wilhelmenia Aloha Raddle., MD;  Location: [facility];  Service: Gastroenterology;  Laterality: N/A;   EXCISION OF BREAST BIOPSY Right 10/12/2020   Procedure: EXCISION OF RIGHT BREAST BIOPSY;  Surgeon: Mavis Anes, MD;  Location: [facility];  Service: General;  Laterality: Right;   IR DIALY SHUNT INTRO NEEDLE/INTRACATH INITIAL W/IMG LEFT Left 07/17/2023    LAPAROSCOPIC APPENDECTOMY  09/29/2011   Procedure: APPENDECTOMY LAPAROSCOPIC;  Surgeon: Anes DELENA Mavis, MD;  Location: [facility];  Service: General;;  incidental appendectomy   LEFT HEART CATH AND CORONARY ANGIOGRAPHY N/A 02/28/2019   Procedure: LEFT HEART CATH AND CORONARY ANGIOGRAPHY;  Surgeon: Dann Candyce RAMAN, MD;  Location: [facility] IN[facility]ASIVE [facility] LAB;  Service: Cardiovascular;  Laterality: N/A;   MASS EXCISION Right 01/18/2023   Procedure: EXCISION MASS, RIGHT CHEST S/P MASTECTOMY;  Surgeon: Mavis Anes, MD;  Location: [facility];  Service: General;  Laterality: Right;   MASTECTOMY MODIFIED RADICAL Right 02/18/2020   Procedure: MASTECTOMY MODIFIED RADICAL;  Surgeon: Mavis Anes, MD;  Location: [facility];  Service: General;  Laterality: Right;   MASTECTOMY, PARTIAL Right 07/13/2020   Procedure: RIGHT PARTIAL MASTECTOMY;  Surgeon: Mavis Anes,  MD;  Location: [facility];  Service: General;  Laterality: Right;   PARTIAL MASTECTOMY WITH NEEDLE LOCALIZATION AND AXILLARY SENTINEL LYMPH NODE BX Right 09/18/2018   Procedure: RIGHT PARTIAL MASTECTOMY AFTER NEEDLE LOCALIZATION, SENTINEL LYMPH NODE BIOPSY RIGHT AXILLA;  Surgeon: Mavis Anes, MD;  Location: [facility];  Service: General;  Laterality: Right;   POLYPECTOMY  06/16/2021   Procedure: POLYPECTOMY;  Surgeon: Shaaron Lamar HERO, MD;  [facility];  Service: Endoscopy;;   PORT-A-CATH REMOVAL Right 11/29/2023   Procedure: REMOVAL PORT-A-CATH;  Surgeon: Mavis Anes, MD;  Location: [facility];  Service: General;  Laterality: Right;  MINOR PROCEDURE ROOM   PORTACATH PLACEMENT Right 06/07/2023   Procedure: INSERTION PORT-A-CATH, RIGHT  (DIALYSIS ACCESS ON LEFT);  Surgeon: Mavis Anes, MD;  Location: [facility];  Service: General;  Laterality: Right;   PORTACATH PLACEMENT Right 04/03/2024   Procedure: INSERTION, TUNNELED CENTRAL VENOUS DEVICE, WITH PORT;  Surgeon: Mavis Anes, MD;  Location: [facility];  Service: General;  Laterality: Right;   SIMPLE MASTECTOMY WITH  AXILLARY SENTINEL NODE BIOPSY Left 06/15/2020   Procedure: LEFT SIMPLE MASTECTOMY;  Surgeon: Mavis Anes, MD;  Location: [facility];  Service: General;  Laterality: Left;     HPI  from the history and physical done on the day of admission:     Chief Complaint: Shortness of breath and cough   HPI: Megan [name] is a 63 y.o. female with medical history significant for ESRD on HD MWF, chronic diastolic CHF, breast cancer post bilateral mastectomy, hypertension, dyslipidemia, and GERD who states that she has been having ongoing shortness of breath over the last [redacted] weeks along with a dry cough that is nonproductive.  She denies any fevers or chills or sick contacts.  She attempted to go to hemodialysis this morning, but due to her symptoms they advised her to go to the ER for evaluation.  She states that she has had very poor appetite for quite some time due to her chemotherapy and has been losing some weight.  Additionally, she has swelling to her left arm that has been present over the last 3 years.   ED Course: Vital signs stable and patient afebrile.  Laboratory data unremarkable with patient on dialysis, but chest x-ray showing some signs of progressive metastatic disease.  Patient has been ordered breathing treatment as well as azithromycin  and Rocephin .  Flu/COVID/RSV testing negative.  Nephrology consulted for dialysis.   Review of Systems: Reviewed as noted above, otherwise negative.     Hospital Course:     Assessment and Plan: 1)Acute hypoxemic respiratory failure with dyspnea and cough-multifactorial - Likely in the setting of some volume overload with HFpEF versus some bronchitis and/or atypical pneumonia compounded by pulmonary metastases, mediastinal, hilar and axillary lymphadenopathy related to underlying breast cancer with pulmonary metastases -Respiratory panel-Neg PCT 0.9---no fevers or leukocytosis, however patient is immunocompromised due to underlying malignancy with  ongoing chemotherapy. -Treated with Rocephin /azithromycin , okay to discharge on Omnicef, azithromycin  and prednisone  - Breathing treatments as needed for cough or wheezing - Dialysis to assist with volume management -Unable to wean off O2 at this time hypoxia and dyspnea on exertion persist currently requiring 2 L of oxygen  via nasal cannula     2)ESRD on HD MWF - -Nephrology consult appreciated -Continue to use hemodialysis sessions to address volume status   Hypertension - Continue amlodipine , Imdur , and Nebivolol    Dyslipidemia - Continue statin   History of breast cancer now with metastasis---pulmonary metastases, mediastinal, hilar and axillary  lymphadenopathy related to underlying breast cancer with pulmonary metastases. - Noted prior mastectomy - Currently undergoing chemotherapy under supervision of Dr. Davonna -Appreciate further oncology evaluation   GERD - c/n PPI   Obesity, class I - BMI 32.01 -Low calorie diet, portion control and increase physical activity discussed with patient - Complicates overall care    Discharge Condition: Stable  Follow UP   Follow-up Information     Davonna Siad, MD. Schedule an appointment as soon as possible for a visit in 1 week(s).   Specialty: Oncology Contact information: [address] [place] [address] [phone]                 Consults obtained - Nephrology/Oncology  Diet and Activity recommendation:  As advised  Discharge Instructions    Discharge Instructions     Call MD for:  persistant dizziness or light-headedness   Complete by: As directed    Call MD for:  persistant nausea and vomiting   Complete by: As directed    Call MD for:  temperature >100.4   Complete by: As directed    Diet - low sodium heart healthy   Complete by: As directed    Discharge instructions   Complete by: As directed    1)Very Low-salt diet advised---Less than 2 gm of Sodium per day advised----ok to use Mrs DASH  salt substitute instead of Salt 2)Weigh yourself daily, call if you gain more than 3 pounds in 1 day or more than 5 pounds in 1 week as your hemodialysis schedule may need to be adjusted 3)Limit your Fluid  intake to No more than 50 ounces (1.5 Liters) per day 4)Please follow-up with hematologist/oncologist Dr. Davonna, MD within 1 week -Address: inside [facility] (4th Floor), [address] [place], [place], [place] [address] Phone: [phone] 5)Please Note several changes to your medications--- 6)you need oxygen  at home at 2 L via nasal cannula continuously while awake and while asleep--- smoking or having open fires around oxygen  can cause fire, significant injury and death   For home use only DME Nebulizer machine   Complete by: As directed    Patient needs a nebulizer to treat with the following condition: Dyspnea and respiratory abnormalities   Length of Need: Lifetime   Additional equipment included:  Administration kit Filter     Increase activity slowly   Complete by: As directed         Discharge Medications     Allergies as of 05/15/2024       Reactions   Motrin [ibuprofen] Other (See Comments)   ESRD        Medication List     STOP taking these medications    nitroGLYCERIN  0.4 MG SL tablet Commonly known as: NITROSTAT    ondansetron  8 MG tablet Commonly known as: Zofran        TAKE these medications    acetaminophen  500 MG tablet Commonly known as: TYLENOL  Take 1,000 mg by mouth every 8 (eight) hours as needed for moderate pain (pain score 4-6).   albuterol  (2.5 MG/3ML) 0.083% nebulizer solution Commonly known as: PROVENTIL  Take 3 mLs (2.5 mg total) by nebulization every 4 (four) hours as needed for wheezing or shortness of breath.   albuterol  108 (90 Base) MCG/ACT inhaler Commonly known as: VENTOLIN  HFA Inhale 2 puffs into the lungs every 6 (six) hours as needed for wheezing or shortness of breath.   amLODipine  5 MG tablet Commonly known as:  NORVASC  Take 5 mg by  mouth at bedtime.   ARANESP  (ALBUMIN FREE) IJ Darbepoetin Alfa  (Aranesp ) What changed: Another medication with the same name was removed. Continue taking this medication, and follow the directions you see here.   aspirin  EC 81 MG tablet Take 81 mg by mouth daily.   atorvastatin  20 MG tablet Commonly known as: LIPITOR TAKE 1 TABLET(20 MG) BY MOUTH DAILY   azithromycin  500 MG tablet Commonly known as: ZITHROMAX  Take 1 tablet (500 mg total) by mouth daily for 3 days.   cefdinir 300 MG capsule Commonly known as: OMNICEF Take 1 capsule (300 mg total) by mouth 2 (two) times daily for 5 days.   cinacalcet  90 MG tablet Commonly known as: SENSIPAR  Take 90 mg by mouth daily.   Dialyvite  800-Zinc  15 0.8 MG Tabs Take 1 tablet by mouth daily.   diphenoxylate -atropine  2.5-0.025 MG tablet Commonly known as: LOMOTIL  Take 1 tablet by mouth 4 (four) times daily as needed for diarrhea or loose stools.   fluticasone  50 MCG/ACT nasal spray Commonly known as: FLONASE  Place 1 spray into both nostrils daily.   gabapentin  300 MG capsule Commonly known as: NEURONTIN  TAKE 1 CAPSULE(300 MG) BY MOUTH AT BEDTIME   guaiFENesin  600 MG 12 hr tablet Commonly known as: Mucinex  Take 1 tablet (600 mg total) by mouth 2 (two) times daily.   guaiFENesin -codeine  100-10 MG/5ML syrup Take 10 mLs by mouth 3 (three) times daily as needed for cough.   isosorbide  mononitrate 30 MG 24 hr tablet Commonly known as: IMDUR  Take 0.5 tablets (15 mg total) by mouth daily.   lanthanum  1000 MG chewable tablet Commonly known as: FOSRENOL  Chew 2,000-3,000 mg by mouth See admin instructions. Take 3 tablets (3000 mg) by mouth with meals and take 2 tablets (2000 mg) with snacks   lidocaine -prilocaine  cream Commonly known as: EMLA  APPLY TOPICALLY TO THE AFFECTED AREA 1 TIME   loperamide  2 MG tablet Commonly known as: IMODIUM  A-D Take 2 mg by mouth 4 (four) times daily as needed for diarrhea or  loose stools.   meclizine  25 MG tablet Commonly known as: ANTIVERT  Take 1 tablet (25 mg total) by mouth 3 (three) times daily as needed for dizziness.   nebivolol  10 MG tablet Commonly known as: BYSTOLIC  TAKE 1 TABLET BY MOUTH IN THE MORNING AND AT BEDTIME   OLANZapine  5 MG tablet Commonly known as: ZyPREXA  Take 1 tablet (5 mg total) by mouth at bedtime.   ondansetron  4 MG disintegrating tablet Commonly known as: ZOFRAN -ODT Take 1 tablet by mouth every 8 (eight) hours as needed for vomiting or nausea.   oxyCODONE  5 MG immediate release tablet Commonly known as: Roxicodone  Take 1 tablet (5 mg total) by mouth every 12 (twelve) hours as needed.   pantoprazole  40 MG tablet Commonly known as: PROTONIX  Take 1 tablet (40 mg total) by mouth 2 (two) times daily before a meal.   predniSONE  20 MG tablet Commonly known as: DELTASONE  Take 2 tablets (40 mg total) by mouth daily with breakfast for 5 days.   prochlorperazine  10 MG tablet Commonly known as: COMPAZINE  Take 1 tablet (10 mg total) by mouth every 6 (six) hours as needed for nausea or vomiting.               Durable Medical Equipment  (From admission, onward)           Start     Ordered   05/15/24 0000  For home use only DME Nebulizer machine       Question Answer  Comment  Patient needs a nebulizer to treat with the following condition Dyspnea and respiratory abnormalities   Length of Need Lifetime   Additional equipment included Administration kit   Additional equipment included Filter      05/15/24 1642           Major procedures and Radiology Reports - PLEASE review detailed and final reports for all details, in brief -  CT CHEST W CONTRAST Result Date: 05/12/2024 CLINICAL DATA:  Breast cancer, invasive, stage IV, initial workup. * Tracking Code: BO * EXAM: CT CHEST WITH CONTRAST TECHNIQUE: Multidetector CT imaging of the chest was performed during intravenous contrast administration. RADIATION DOSE  REDUCTION: This exam was performed according to the departmental dose-optimization program which includes automated exposure control, adjustment of the mA and/or kV according to patient size and/or use of iterative reconstruction technique. CONTRAST:  75mL OMNIPAQUE  IOHEXOL  300 MG/ML  SOLN COMPARISON:  Nuclear medicine PET scan from 01/31/2024 and CT scan chest from 01/24/2024. FINDINGS: Cardiovascular: Normal cardiac size. No pericardial effusion. No aortic aneurysm. There are coronary artery calcifications, in keeping with coronary artery disease. There are also mild peripheral atherosclerotic vascular calcifications of thoracic aorta and its major branches. Mediastinum/Nodes: Visualized thyroid  gland appears grossly unremarkable. No solid / cystic mediastinal masses. The esophagus is nondistended precluding optimal assessment. There multiple new/enlarging mediastinal, bilateral hilar and left axillary lymph nodes with largest in the left axilla and supra carinal region both measuring up to 1.8 x 2.8 cm, compatible with worsening metastases. No significant right axillary lymphadenopathy by size criteria. There are multiple surgical staples in the right axillary region. Lungs/Pleura: Since the prior study there are extensive new solid pulmonary nodules ranging in size from few mm up to 5.9 cm, compatible with worsening metastatic disease. There is small-to-moderate right pleural effusion. No left pleural effusion. No pneumothorax on either side. There also peripheral subsegmental atelectatic changes in the middle lobe. Upper Abdomen: There is a new 1.0 x 1.2 cm ill-defined hypoattenuating lesion in the right hepatic lobe segment 6/7, which is incompletely characterized on the current exam but concerning for metastasis in the given clinical setting. Remaining visualized upper abdominal viscera within normal limits. Musculoskeletal: A CT Port-a-Cath is seen in the right upper chest wall with the catheter terminating  in the lower portion of superior vena cava. Visualized soft tissues of the chest wall are otherwise grossly unremarkable. No suspicious osseous lesions. There are mild multilevel degenerative changes in the visualized spine. IMPRESSION: 1. Since the prior study, there are extensive new solid pulmonary nodules, mediastinal, bilateral hilar and left axillary lymphadenopathy, compatible with worsening metastatic disease. 2. There is also a new 1.0 x 1.2 cm ill-defined hypoattenuating lesion in the right hepatic lobe, which is incompletely characterized on the current exam but concerning for metastasis in the given clinical setting. 3. New small-to-moderate right pleural effusion. 4. Multiple other nonacute observations, as described above. Aortic Atherosclerosis (ICD10-I70.0). Electronically Signed   By: Ree Molt M.D.   On: 05/12/2024 15:25   DG Chest Port 1 View Result Date: 05/12/2024 CLINICAL DATA:  63 year old female with possible sepsis. Metastatic breast cancer. EXAM: PORTABLE CHEST 1 VIEW COMPARISON:  Portable chest 03/14/2024 and earlier. FINDINGS: Portable AP semi upright view at 0550 hours. Extensive now moderate sized and relatively large rounded pulmonary masslike opacities throughout both lungs. Substantial progression compared to 04/03/2024. Associated streaky and confluent right perihilar opacity, left lung base opacity. Probable small pleural effusions. No pneumothorax. Stable right chest power port. Lung volumes  and mediastinal contours not significantly changed. Stable visualized osseous structures.  Paucity of bowel gas. IMPRESSION: Portable radiographic appearance suggests severe progression of pulmonary metastatic disease since 04/03/2024. Some superimposed airspace opacity and probable small pleural effusions. Electronically Signed   By: VEAR Hurst M.D.   On: 05/12/2024 06:18   PERIPHERAL VASCULAR CATHETERIZATION Result Date: 04/17/2024 Images from the original result were not included.    Patient name: Megan [name]  MRN: [id number]        DOB: [birth year]          Sex: female  04/16/2024 Pre-operative Diagnosis: ESRD on HD Post-operative diagnosis:  Same Surgeon:  Norman GORMAN Serve, MD Procedure Performed:  [phone], ultrasound-guided access of dialysis circuit with interpretation of fistulogram and central venogram  Indications: Ms. send is a 63 year old female with ESRD on HD presenting to the HD access center today for fistulogram.  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  We discussed the risks and benefits of fistulogram with intervention, she expressed understanding and elected to proceed.  Findings: Widely patent central venous system.  Widely patent but tortuous left arm radiocephalic fistula.  Widely patent anastomosis             Procedure:  The patient was identified in the holding area and taken to the cath lab  The patient was then placed supine on the table and prepped and draped in the usual sterile fashion.  A time out was called.  Ultrasound was used to evaluate the left arm AV access. This was accessed under u/s guidance. An 018 wire was advanced without resistance, a micropuncture sheath was placed and fistulagram obtained which demonstrated the above findings.  Contrast: 25cc Sedation: None  Impression: Wide patency of left arm AV fistula and central venous system.  Left arm swelling likely related to new lymph node metastasis from previous right-sided breast cancer   Norman GORMAN Serve MD [facility] Office: [phone]   Micro Results   Recent Results (from the past 240 hours)  Resp panel by RT-PCR (RSV, Flu A&B, Covid) Anterior Nasal Swab     Status: None   Collection Time: 05/12/24  5:40 AM   Specimen: Anterior Nasal Swab  Result Value Ref Range Status   SARS Coronavirus 2 by RT PCR NEGATIVE NEGATIVE  Final    Comment: (NOTE) SARS-CoV-2 target nucleic acids are NOT DETECTED.  The SARS-CoV-2 RNA is generally detectable in upper respiratory specimens during the acute phase of infection. The lowest concentration of SARS-CoV-2 viral copies this assay can detect is 138 copies/mL. A negative result does not preclude SARS-Cov-2 infection and should not be used as the sole basis for treatment or other patient management decisions. A negative result may occur with  improper specimen collection/handling, submission of specimen other than nasopharyngeal swab, presence of viral mutation(s) within the areas targeted by this assay, and inadequate number of viral copies(<138 copies/mL). A negative result must be combined with clinical observations, patient history, and epidemiological information. The expected result is Negative.  Fact Sheet for Patients:  BloggerCourse.com  Fact Sheet for Healthcare Providers:  SeriousBroker.it  This test is no t yet approved or cleared by the [place]  FDA and  has been authorized for detection and/or diagnosis of SARS-CoV-2 by FDA under an Emergency Use Authorization (EUA). This EUA will remain  in  effect (meaning this test can be used) for the duration of the COVID-19 declaration under Section 564(b)(1) of the Act, 21 U.S.C.section 360bbb-3(b)(1), unless the authorization is terminated  or revoked sooner.       Influenza A by PCR NEGATIVE NEGATIVE Final   Influenza B by PCR NEGATIVE NEGATIVE Final    Comment: (NOTE) The Xpert Xpress SARS-CoV-2/FLU/RSV plus assay is intended as an aid in the diagnosis of influenza from Nasopharyngeal swab specimens and should not be used as a sole basis for treatment. Nasal washings and aspirates are unacceptable for Xpert Xpress SARS-CoV-2/FLU/RSV testing.  Fact Sheet for Patients: BloggerCourse.com  Fact Sheet for Healthcare  Providers: SeriousBroker.it  This test is not yet approved or cleared by the [place]  FDA and has been authorized for detection and/or diagnosis of SARS-CoV-2 by FDA under an Emergency Use Authorization (EUA). This EUA will remain in effect (meaning this test can be used) for the duration of the COVID-19 declaration under Section 564(b)(1) of the Act, 21 U.S.C. section 360bbb-3(b)(1), unless the authorization is terminated or revoked.     Resp Syncytial Virus by PCR NEGATIVE NEGATIVE Final    Comment: (NOTE) Fact Sheet for Patients: BloggerCourse.com  Fact Sheet for Healthcare Providers: SeriousBroker.it  This test is not yet approved or cleared by the [place]  FDA and has been authorized for detection and/or diagnosis of SARS-CoV-2 by FDA under an Emergency Use Authorization (EUA). This EUA will remain in effect (meaning this test can be used) for the duration of the COVID-19 declaration under Section 564(b)(1) of the Act, 21 U.S.C. section 360bbb-3(b)(1), unless the authorization is terminated or revoked.  Performed at [facility], [address], [place], [place] [address]   Blood Culture (routine x 2)     Status: None (Preliminary result)   Collection Time: 05/12/24  5:45 AM   Specimen: BLOOD  Result Value Ref Range Status   Specimen Description BLOOD RIGHT ANTECUBITAL  Final   Special Requests   Final    BOTTLES DRAWN AEROBIC AND ANAEROBIC Blood Culture adequate volume   Culture   Final    NO GROWTH 3 DAYS Performed at [facility], [address] [place], [place] [address]    Report Status PENDING  Incomplete  Blood Culture (routine x 2)     Status: None (Preliminary result)   Collection Time: 05/12/24  6:54 AM   Specimen: BLOOD  Result Value Ref Range Status   Specimen Description BLOOD BLOOD RIGHT HAND  Final   Special Requests NONE  Final   Culture   Final    NO GROWTH 3  DAYS Performed at [facility], [address] [place], [place] [address]    Report Status PENDING  Incomplete  Respiratory (~20 pathogens) panel by PCR     Status: None   Collection Time: 05/12/24  9:02 AM   Specimen: Nasopharyngeal Swab; Respiratory  Result Value Ref Range Status   Adenovirus NOT DETECTED NOT DETECTED Final   Coronavirus 229E NOT DETECTED NOT DETECTED Final    Comment: (NOTE) The Coronavirus on the Respiratory Panel, DOES NOT test for the novel  Coronavirus (2019 nCoV)    Coronavirus HKU1 NOT DETECTED NOT DETECTED Final   Coronavirus NL63 NOT DETECTED NOT DETECTED Final   Coronavirus OC43 NOT DETECTED NOT DETECTED Final   Metapneumovirus NOT DETECTED NOT DETECTED Final   Rhinovirus / Enterovirus NOT DETECTED NOT DETECTED Final   Influenza A NOT DETECTED NOT DETECTED Final   Influenza B NOT DETECTED NOT  DETECTED Final   Parainfluenza Virus 1 NOT DETECTED NOT DETECTED Final   Parainfluenza Virus 2 NOT DETECTED NOT DETECTED Final   Parainfluenza Virus 3 NOT DETECTED NOT DETECTED Final   Parainfluenza Virus 4 NOT DETECTED NOT DETECTED Final   Respiratory Syncytial Virus NOT DETECTED NOT DETECTED Final   Bordetella pertussis NOT DETECTED NOT DETECTED Final   Bordetella Parapertussis NOT DETECTED NOT DETECTED Final   Chlamydophila pneumoniae NOT DETECTED NOT DETECTED Final   Mycoplasma pneumoniae NOT DETECTED NOT DETECTED Final    Comment: Performed at [facility], [address] [place], [place] [address]    Today   Subjective    Megan [name] today has no new ccomplaints  Cough persist No fever  Or chills  Dyspnea on exertion and hypoxia improved but did not resolve -Husband at bedside, questions answered    Patient has been seen and examined prior to discharge   Objective   Blood pressure 117/67, pulse 85, temperature 97.9 F (36.6 C), resp. rate 19, height 5' 1.5 (1.562 m), weight 81.9 kg, SpO2 92%.   Intake/Output Summary (Last 24  hours) at 05/15/2024 1645 Last data filed at 05/15/2024 1300 Gross per 24 hour  Intake 600 ml  Output 2200 ml  Net -1600 ml    Exam Gen:- Awake Alert, no acute distress , no conversational dyspnea HEENT:- [place].AT, No sclera icterus Nose- [place] 2L/min Neck-Supple Neck,No JVD,.  Lungs-improving air movement after hemodialysis, no wheezing CV- S1, S2 normal, regular Abd-  +ve B.Sounds, Abd Soft, No tenderness,    Extremity/Skin:- No  edema,   good pulses Psych-affect is less flat, oriented x3 Neuro-no new focal deficits, no tremors    Data Review   CBC w Diff:  Lab Results  Component Value Date   WBC 9.3 05/14/2024   HGB 9.8 (L) 05/14/2024   HCT 31.6 (L) 05/14/2024   PLT 260 05/14/2024   LYMPHOPCT 13 05/12/2024   MONOPCT 8 05/12/2024   EOSPCT 3 05/12/2024   BASOPCT 1 05/12/2024    CMP:  Lab Results  Component Value Date   NA 140 05/14/2024   K 5.0 05/14/2024   CL 97 (L) 05/14/2024   CO2 24 05/14/2024   BUN 44 (H) 05/14/2024   CREATININE 11.20 (H) 05/14/2024   CREATININE 9.08 (H) 01/10/2024   PROT 6.8 05/12/2024   ALBUMIN 2.9 (L) 05/12/2024   BILITOT 0.7 05/12/2024   ALKPHOS 91 05/12/2024   AST 22 05/12/2024   ALT 16 05/12/2024  .  Total Discharge time is about 33 minutes  Rendall Carwin M.D on 05/15/2024 at 4:45 PM  Go to [email or web address] -  for contact info  [facility] - Office  [phone]

## 2024-05-15 NOTE — TOC Transition Note (Signed)
 Transition of Care [facility]) - Discharge Note   Patient Details  Name: Megan [name] MRN: [id number] Date of Birth: 1961-05-06  Transition of Care [facility]) CM/SW Contact:  Noreen KATHEE Cleotilde ISRAEL Phone Number: 05/15/2024, 3:22 PM   Clinical Narrative:     Patient is discharging today. Ashly With [employer] came and brought patient her POC and oxygen  tank to go home with . Spouse is very supportive and does all the driving for patient. CSW signing off.   Final next level of care: Home/Self Care Barriers to Discharge: Barriers Resolved   Patient Goals and CMS Choice Patient states their goals for this hospitalization and ongoing recovery are:: DC home          Discharge Placement                Patient to be transferred to facility by: POV Name of family member notified: Kiyonna and spouse who was at bedside Patient and family notified of of transfer: 05/15/24  Discharge Plan and Services Additional resources added to the After Visit Summary for                  DME Arranged: Oxygen  DME Agency: Other - Comment Britt) Date DME Agency Contacted: 05/15/24 Time DME Agency Contacted: [phone] Representative spoke with at DME Agency: Ashly            Social Drivers of Health (SDOH) Interventions SDOH Screenings   Food Insecurity: No Food Insecurity (05/12/2024)  Housing: Low Risk  (05/13/2024)  Transportation Needs: No Transportation Needs (05/12/2024)  Utilities: Not At Risk (05/12/2024)  Alcohol Screen: Low Risk  (09/20/2023)  Depression (PHQ2-9): Low Risk  (05/06/2024)  Financial Resource Strain: Low Risk  (09/20/2023)  Physical Activity: Inactive (09/20/2023)  Social Connections: Socially Integrated (05/12/2024)  Stress: No Stress Concern Present (09/20/2023)  Tobacco Use: Low Risk  (05/12/2024)     Readmission Risk Interventions    05/15/2024    3:20 PM  Readmission Risk Prevention Plan  Transportation Screening Complete  Medication Review Oceanographer) Complete   SW Recovery Care/Counseling Consult Complete  Palliative Care Screening Not Applicable  Skilled Nursing Facility Not Applicable

## 2024-05-15 NOTE — Progress Notes (Addendum)
  SATURATION QUALIFICATIONS: (This note is used to comply with regulatory documentation for home oxygen )   Patient Saturations on Room Air at Rest = 87 %   Patient Saturations on Room Air while Ambulating = 85 %   Patient Saturations on 2 Liters of oxygen  while Ambulating = 92 %     Patient needs continuous O2 at 2 L/min continuously via nasal cannula with humidifier, with gaseous portability and conserving device    Diagnosis----Chronic Diastolic Dysfunction CHF  Rendall Carwin, MD

## 2024-05-15 NOTE — Care Management Important Message (Deleted)
 Important Message  Patient Details  Name: Megan [name] MRN: [id number] Date of Birth: 11/05/1960   Important Message Given:  Yes - Medicare IM     [name] 05/15/2024, 4:35 PM
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
## 2024-05-15 NOTE — Care Management Important Message (Signed)
 Important Message  Patient Details  Name: Megan [name] MRN: [id number] Date of Birth: 1961-07-26   Important Message Given:  N/A - LOS <3 / Initial given by admissions     Megan [name] [name] 05/15/2024, 4:35 PM

## 2024-05-15 NOTE — Progress Notes (Signed)
 [facility] Progress Note    Assessment/ Plan:   ESRD  -outpatient HD orders: [facility]. MWF.  3 hours 15 minutes.  EDW 79.9 kg.  2K/2.5 calcium .  aVF, 15G.  Flow rates: 400/500.  Heparin : None.  Meds: Aranesp  160 mcg once weekly (last dose 9/26), Venofer  50 mg once weekly, calcitriol  1.5 mcg every treatment -HD Wed (2.2L net UF) per her MWF schedule; she requests to be 1st shift and if she's still here tomorrow we will make that happen.  Still on supplemental O2 via nasal cannula.    SOB -secondary to worsening metastatic disease +/- pneumonia -mgmt+abx per primary service--currently on rocephin  and azithro   Volume/ hypertension  -EDW 79.7kg. UF as tolerated.  Hopefully she tolerates UF today and hospitalist to rechallenge to see if she can come off the O2 as she is not on O2 at home.   Anemia of Chronic Kidney Disease -Hemoglobin 10.2. Hold ESA moving forward in the context of active malignancy+chemo unless cleared by oncology -Transfuse PRN for Hgb <7   Secondary Hyperparathyroidism/Hyperphosphatemia - resume home meds, monitor phos, will resume calcitriol  with HD    Vascular access Chronic LUE swelling -s/p recent fistulogram on 9/3 for chronic left arm swelling: found to have widely patent system, swelling likely related to lymph node mets  Breast Ca with mets -oncology following peripherally     Subjective:   Patient seen and examined bedside. Husband at bedside. Net UF Monday with HD: ~1.6L, 2.2L on Wed She reports that cough is better, still has residual rib pain; dizziness when ambulating and still O2 dependent even after rechecking post dialysis Wed..     Objective:   BP 111/60 (BP Location: Right Arm)   Pulse 76   Temp (!) 97.5 F (36.4 C) (Oral)   Resp 18   Ht 5' 1.5 (1.562 m)   Wt 81.9 kg   SpO2 95%   BMI 33.56 kg/m   Intake/Output Summary (Last 24 hours) at 05/15/2024 9081 Last data filed at 05/14/2024 1841 Gross  per 24 hour  Intake --  Output 2200 ml  Net -2200 ml   Weight change:   Physical Exam: Gen: NAD CVS: RRR Resp: decreased breath sounds bibasilar, no w/r/r/c appreciated, unlabored, normal wob Abd: soft, NT/ND Ext: no sig edema b/l Les, LUE swelling (chronic)-slightly improved Neuro: awake, alert Dialysis access: LUE AVF +b/t  Imaging: No results found.   Labs: BMET Recent Labs  Lab 05/08/24 1005 05/12/24 0545 05/13/24 0355 05/14/24 0418  NA 139 141 138 140  K 3.3* 3.9 4.4 5.0  CL 96* 98 96* 97*  CO2 26 26 27 24   GLUCOSE 124* 106* 88 81  BUN 27* 47* 29* 44*  CREATININE 8.94* 12.06* 8.07* 11.20*  CALCIUM  8.5* 8.0* 7.7* 8.3*   CBC Recent Labs  Lab 05/08/24 1005 05/12/24 0545 05/13/24 0355 05/14/24 0418  WBC 6.9 9.1 9.6 9.3  NEUTROABS 4.8 6.8  --   --   HGB 9.6* 10.2* 10.1* 9.8*  HCT 31.2* 32.6* 32.2* 31.6*  MCV 113.5* 111.6* 110.3* 112.9*  PLT 257 274 252 260    Medications:     amLODipine   5 mg Oral QHS   aspirin  EC  81 mg Oral Daily   atorvastatin   20 mg Oral Daily   calcitRIOL   1.5 mcg Oral Q M,W,F-HD   capecitabine   500 mg Oral BID PC   Chlorhexidine  Gluconate Cloth  6 each Topical Q0600   cinacalcet   90 mg  Oral Q breakfast   gabapentin   300 mg Oral QHS   heparin   5,000 Units Subcutaneous Q8H   isosorbide  mononitrate  15 mg Oral Daily   nebivolol   10 mg Oral BID   OLANZapine   5 mg Oral QHS   pantoprazole   40 mg Oral BID AC

## 2024-05-16 ENCOUNTER — Telehealth: Payer: Self-pay | Admitting: *Deleted

## 2024-05-16 DIAGNOSIS — E119 Type 2 diabetes mellitus without complications: Secondary | ICD-10-CM | POA: Diagnosis not present

## 2024-05-16 DIAGNOSIS — N2581 Secondary hyperparathyroidism of renal origin: Secondary | ICD-10-CM | POA: Diagnosis not present

## 2024-05-16 DIAGNOSIS — N186 End stage renal disease: Secondary | ICD-10-CM | POA: Diagnosis not present

## 2024-05-16 DIAGNOSIS — E1129 Type 2 diabetes mellitus with other diabetic kidney complication: Secondary | ICD-10-CM | POA: Diagnosis not present

## 2024-05-16 DIAGNOSIS — Z992 Dependence on renal dialysis: Secondary | ICD-10-CM | POA: Diagnosis not present

## 2024-05-16 DIAGNOSIS — D509 Iron deficiency anemia, unspecified: Secondary | ICD-10-CM | POA: Diagnosis not present

## 2024-05-16 DIAGNOSIS — D631 Anemia in chronic kidney disease: Secondary | ICD-10-CM | POA: Diagnosis not present

## 2024-05-16 NOTE — Transitions of Care (Post Inpatient/ED Visit) (Signed)
 05/16/2024  Name: Megan [name] MRN: [id number] DOB: [birth year]  Today's TOC FU Call Status: Today's TOC FU Call Status:: Successful TOC FU Call Completed TOC FU Call Complete Date: 05/16/24 Patient's Name and Date of Birth confirmed.  Transition Care Management Follow-up Telephone Call Date of Discharge: 05/15/24 [facility] Type of Discharge: Inpatient Admission How have you been since you were released from the hospital?:  (eating, drinking well, dialysis M,W,F, no concerns reported) Any questions or concerns?: No  Items Reviewed: Did you receive and understand the discharge instructions provided?: Yes Medications obtained,verified, and reconciled?: Yes (Medications Reviewed) Any new allergies since your discharge?: No Dietary orders reviewed?: Yes Type of Diet Ordered:: renal,  32 ounce fluid restrictions Do you have support at home?: Yes People in Home [RPT]: spouse Name of Support/Comfort Primary Source: Lynwood Bunker Reviewed signs/ symptoms of infection, pneumonia, importance of taking antibiotics as prescribed  Medications Reviewed Today: Medications Reviewed Today     Reviewed by Aura Mliss LABOR, RN (Registered Nurse) on 05/16/24 at 1312  Med List Status: <None>   Medication Order Taking? Sig Documenting Provider Last Dose Status Informant  acetaminophen  (TYLENOL ) 500 MG tablet [id number] Yes Take 1,000 mg by mouth every 8 (eight) hours as needed for moderate pain (pain score 4-6). [provider]  Active Self, Spouse/Significant Other, Pharmacy Records  albuterol  (PROVENTIL ) (2.5 MG/3ML) 0.083% nebulizer solution [id number] Yes Take 3 mLs (2.5 mg total) by nebulization every 4 (four) hours as needed for wheezing or shortness of breath. Pearlean Manus, MD  Active   albuterol  (VENTOLIN  HFA) 108 (90 Base) MCG/ACT inhaler [id number] Yes Inhale 2 puffs into the lungs every 6 (six) hours as needed for wheezing or shortness of breath.  Pearlean Manus, MD  Active   amLODipine  (NORVASC ) 5 MG tablet [id number] Yes Take 5 mg by mouth at bedtime. [provider]  Active Self, Spouse/Significant Other, Pharmacy Records  aspirin  EC 81 MG tablet [id number] Yes Take 81 mg by mouth daily. [provider]  Active Self, Spouse/Significant Other, Pharmacy Records  atorvastatin  (LIPITOR) 20 MG tablet [id number] Yes TAKE 1 TABLET(20 MG) BY MOUTH DAILY Duanne Butler DASEN, MD  Active Self, Spouse/Significant Other, Pharmacy Records  azithromycin  (ZITHROMAX ) 500 MG tablet [id number] Yes Take 1 tablet (500 mg total) by mouth daily for 3 days. Pearlean Manus, MD  Active   B Complex-C-Zn-Folic Acid  (DIALYVITE  800-ZINC  15) 0.8 MG TABS [id number] Yes Take 1 tablet by mouth daily. [provider]  Active Self, Spouse/Significant Other, Pharmacy Records  cefdinir (OMNICEF) 300 MG capsule [id number] Yes Take 1 capsule (300 mg total) by mouth 2 (two) times daily for 5 days. Pearlean Manus, MD  Active   cinacalcet  (SENSIPAR ) 90 MG tablet [id number] Yes Take 90 mg by mouth daily. [provider]  Active Self, Spouse/Significant Other, Pharmacy Records           Med Note DARILYN STEPHANE GORMAN Pablo May 12, 2024  1:50 PM) Thursday 05/08/24 started back  Darbepoetin Alfa  (ARANESP , ALBUMIN FREE, IJ) [id number] Yes Darbepoetin Alfa  (Aranesp ) [provider]  Active Self, Spouse/Significant Other, Pharmacy Records           Med Note (COFFELL, JON HERO   Sun Oct 14, 2023 12:16 PM) Unknown last dose, receives at dialysis.  diphenoxylate -atropine  (LOMOTIL ) 2.5-0.025 MG tablet [id number] Yes Take 1 tablet by mouth 4 (four) times daily as needed for diarrhea or loose stools. Davonna Siad, MD  Active Self, Spouse/Significant Other, Pharmacy Records  fluticasone  (FLONASE ) 50 MCG/ACT nasal spray [id number] Yes Place 1 spray into both nostrils daily. [provider]  Active Self, Spouse/Significant Other, Pharmacy Records   gabapentin  (NEURONTIN ) 300 MG capsule [id number] Yes TAKE 1 CAPSULE(300 MG) BY MOUTH AT BEDTIME Duanne Butler DASEN, MD  Active Self, Spouse/Significant Other, Pharmacy Records  guaiFENesin  (MUCINEX ) 600 MG 12 hr tablet [id number] Yes Take 1 tablet (600 mg total) by mouth 2 (two) times daily. Pearlean Manus, MD  Active   guaiFENesin -codeine  100-10 MG/5ML syrup [id number] Yes Take 10 mLs by mouth 3 (three) times daily as needed for cough. Pearlean Manus, MD  Active   isosorbide  mononitrate (IMDUR ) 30 MG 24 hr tablet [id number] Yes Take 0.5 tablets (15 mg total) by mouth daily. Alvan Dorn FALCON, MD  Active Self, Spouse/Significant Other, Pharmacy Records  lanthanum  (FOSRENOL ) 1000 MG chewable tablet [id number] Yes Chew 2,000-3,000 mg by mouth See admin instructions. Take 3 tablets (3000 mg) by mouth with meals and take 2 tablets (2000 mg) with snacks [provider]  Active Self, Spouse/Significant Other, Pharmacy Records           Med Note CLAUD MICHEAL DASEN Sonjia Oct 05, 2020 11:02 AM)    lidocaine -prilocaine  (EMLA ) cream [id number] Yes APPLY TOPICALLY TO THE AFFECTED AREA 1 TIME Rogers Hai, MD  Active Self, Spouse/Significant Other, Pharmacy Records  loperamide  (IMODIUM  A-D) 2 MG tablet [id number] Yes Take 2 mg by mouth 4 (four) times daily as needed for diarrhea or loose stools. [provider]  Active Self, Spouse/Significant Other, Pharmacy Records  meclizine  (ANTIVERT ) 25 MG tablet [id number] Yes Take 1 tablet (25 mg total) by mouth 3 (three) times daily as needed for dizziness. Duanne Butler DASEN, MD  Active Self, Spouse/Significant Other, Pharmacy Records  nebivolol  (BYSTOLIC ) 10 MG tablet [id number] Yes TAKE 1 TABLET BY MOUTH IN THE MORNING AND AT BEDTIME Duanne Butler DASEN, MD  Active Self, Spouse/Significant Other, Pharmacy Records  Nebulizers (COMPRESSOR/NEBULIZER) MISC [id number] Yes 1 Units by Does not apply route daily as needed. Pearlean Manus, MD  Active    OLANZapine  (ZYPREXA ) 5 MG tablet [id number] Yes Take 1 tablet (5 mg total) by mouth at bedtime. Davonna Siad, MD  Active Self, Spouse/Significant Other, Pharmacy Records  ondansetron  (ZOFRAN -ODT) 4 MG disintegrating tablet [id number] Yes Take 1 tablet by mouth every 8 (eight) hours as needed for vomiting or nausea. [provider]  Active Self, Spouse/Significant Other, Pharmacy Records  oxyCODONE  (ROXICODONE ) 5 MG immediate release tablet [id number] Yes Take 1 tablet (5 mg total) by mouth every 12 (twelve) hours as needed. Katragadda, Sreedhar, MD  Active Self, Spouse/Significant Other, Pharmacy Records  pantoprazole  (PROTONIX ) 40 MG tablet [id number] Yes Take 1 tablet (40 mg total) by mouth 2 (two) times daily before a meal. Rourk, Lamar HERO, MD  Active Self, Spouse/Significant Other, Pharmacy Records  predniSONE  (DELTASONE ) 20 MG tablet [id number] Yes Take 2 tablets (40 mg total) by mouth daily with breakfast for 5 days. Pearlean Manus, MD  Active   prochlorperazine  (COMPAZINE ) 10 MG tablet [id number] Yes Take 1 tablet (10 mg total) by mouth every 6 (six) hours as needed for nausea or vomiting. Davonna Siad, MD  Active Self, Spouse/Significant Other, Pharmacy Records  Med List Note Teretha Renaee SAILOR, RPH-CPP 04/10/24 1401): Xeloda  (capecitabine ) filled at [facility] (Specialty)            Home Care and Equipment/Supplies: Were Home Health Services Ordered?: No Any new equipment or medical supplies ordered?: No  Functional Questionnaire: Do you  need assistance with bathing/showering or dressing?: No Do you need assistance with meal preparation?: No Do you need assistance with eating?: No Do you have difficulty maintaining continence: No (dialysis 3 x per week) Do you need assistance with getting out of bed/getting out of a chair/moving?: No Do you have difficulty managing or taking your medications?: No  Follow up appointments reviewed: PCP Follow-up appointment  confirmed?: No (pt declines for RN CM to schedule appointment) MD Provider Line [phone] Given: No Specialist Hospital Follow-up appointment confirmed?: Yes Date of Specialist follow-up appointment?: 05/29/24 Follow-Up Specialty Provider:: Dr. Davonna   hematology/ oncology    PET scan 05/22/24 Do you need transportation to your follow-up appointment?: No Do you understand care options if your condition(s) worsen?: Yes-patient verbalized understanding  SDOH Interventions Today    Flowsheet Row Most Recent Value  SDOH Interventions   Food Insecurity Interventions Intervention Not Indicated  Housing Interventions Intervention Not Indicated  Transportation Interventions Intervention Not Indicated  Utilities Interventions Intervention Not Indicated   Mliss Creed [facility], BSN RN Care Manager/ Transition of Care [place]/ [facility] [phone]

## 2024-05-16 NOTE — Transitions of Care (Post Inpatient/ED Visit) (Signed)
   05/16/2024  Name: Megan [name] MRN: [id number] DOB: [birth year]  Today's TOC FU Call Status: Today's TOC FU Call Status:: Unsuccessful Call (1st Attempt) Unsuccessful Call (1st Attempt) Date: 05/16/24  Attempted to reach the patient regarding the most recent Inpatient/ED visit. Spoke with pt, she is at dialysis, requests a call later.  Follow Up Plan: Additional outreach attempts will be made to reach the patient to complete the Transitions of Care (Post Inpatient/ED visit) call.   Mliss Creed [facility], BSN RN Care Manager/ Transition of Care [place]/ [facility] [phone]

## 2024-05-17 LAB — CULTURE, BLOOD (ROUTINE X 2)
Culture: NO GROWTH
Culture: NO GROWTH
Special Requests: ADEQUATE

## 2024-05-19 ENCOUNTER — Other Ambulatory Visit: Payer: Self-pay

## 2024-05-19 ENCOUNTER — Other Ambulatory Visit (HOSPITAL_COMMUNITY): Payer: Self-pay

## 2024-05-19 ENCOUNTER — Ambulatory Visit: Payer: Self-pay | Admitting: Cardiology

## 2024-05-19 DIAGNOSIS — E1129 Type 2 diabetes mellitus with other diabetic kidney complication: Secondary | ICD-10-CM | POA: Diagnosis not present

## 2024-05-19 DIAGNOSIS — D631 Anemia in chronic kidney disease: Secondary | ICD-10-CM | POA: Diagnosis not present

## 2024-05-19 DIAGNOSIS — N2581 Secondary hyperparathyroidism of renal origin: Secondary | ICD-10-CM | POA: Diagnosis not present

## 2024-05-19 DIAGNOSIS — Z992 Dependence on renal dialysis: Secondary | ICD-10-CM | POA: Diagnosis not present

## 2024-05-19 DIAGNOSIS — D509 Iron deficiency anemia, unspecified: Secondary | ICD-10-CM | POA: Diagnosis not present

## 2024-05-19 DIAGNOSIS — N186 End stage renal disease: Secondary | ICD-10-CM | POA: Diagnosis not present

## 2024-05-19 NOTE — Progress Notes (Signed)
 Disenrolling patient from [facility].   Good morning. Per Wheeler Senters, Rn telephone encounter, Dr. Davonna notified patient to stop Xeloda .

## 2024-05-20 ENCOUNTER — Other Ambulatory Visit: Payer: Self-pay | Admitting: Family Medicine

## 2024-05-20 ENCOUNTER — Encounter: Payer: Self-pay | Admitting: Family Medicine

## 2024-05-20 ENCOUNTER — Ambulatory Visit: Admitting: Family Medicine

## 2024-05-20 VITALS — BP 136/68 | HR 87 | Ht 61.5 in | Wt 180.0 lb

## 2024-05-20 DIAGNOSIS — C7800 Secondary malignant neoplasm of unspecified lung: Secondary | ICD-10-CM

## 2024-05-20 DIAGNOSIS — C50919 Malignant neoplasm of unspecified site of unspecified female breast: Secondary | ICD-10-CM | POA: Diagnosis not present

## 2024-05-20 MED ORDER — HYDROCODONE BIT-HOMATROP MBR 5-1.5 MG/5ML PO SOLN: 5.0000 mL | Freq: Three times a day (TID) | ORAL | 0 refills | Status: DC | PRN

## 2024-05-20 NOTE — Progress Notes (Signed)
 Subjective:    Patient ID: Megan [name], female    DOB: April 20, 1961, 63 y.o.   MRN: [id number]  Unfortunately, the patient has been diagnosed with metastatic breast cancer.  Patient recently was admitted to the hospital with shortness of breath.  CT scan of the lungs obtained on September 29 showed extensive nodules in the lungs ranging from millimeters in size to up to 6 cm.  There is a large opacity in the right lung seen on CAT scan.  Patient was treated for possible pneumonia however her cough and her shortness of breath have not improved.  I believe that her cough and shortness of breath is likely due to extensive pulmonary metastases.  She is scheduled to see her oncologist this week to discuss changes in her chemotherapy regimen.  She is asking for something else that she can take for cough.  Codeine  with guaifenesin  was unsuccessful.  She is not currently taking pain medication.  She also reports feeling weak.  She is having a difficult time standing and walking.  She goes shopping relieves the home, she is requesting a wheelchair that she can take with her to assist in ambulation and mobility so that she can leave the house easily. Past Medical History:  Diagnosis Date   (HFpEF) heart failure with preserved ejection fraction (HCC)    a. 01/2019 Echo: EF 55-60%, mild conc LVH. DD.  Torn MV chordae.   Anemia    Atypical chest pain    a. 08/2018 MV: EF 59%, no ischemia; b. 02/2019 Cath: nonobs dzs.   Blood transfusion without reported diagnosis    Breast cancer (HCC) 10/12/2020   Cataract    ESRD (end stage renal disease) on dialysis [facility]    a. HD T, T, S   Essential hypertension, benign    GERD (gastroesophageal reflux disease)    Headache    Hemorrhoids    Mixed hyperlipidemia    Morbid obesity (HCC)    Non-obstructive CAD (coronary artery disease)    a. 02/2019 CathL LM nl, LAD 39m, LCX nl, RCA 25p, EF 55-65%.   PONV (postoperative nausea and vomiting)    Renal insufficiency     S/P colonoscopy 08/2009   Dr. Rollin: sessile polyp (benign lymphoid), large hemorrhoids, repeat 5-10 years   Temporal arteritis (HCC)    Type 2 diabetes mellitus (HCC)    Wears glasses    Past Surgical History:  Procedure Laterality Date   A/V SHUNT INTERVENTION Left 04/16/2024   Procedure: A/V SHUNT INTERVENTION;  Surgeon: Pearline Norman RAMAN, MD;  Location: [facility];  Service: Cardiovascular;  Laterality: Left;   ABDOMINAL HYSTERECTOMY     APPENDECTOMY     ARTERY BIOPSY N/A 05/09/2018   Procedure: RIGHT TEMPORAL ARTERY BIOPSY;  Surgeon: Kimble Agent, MD;  Location: [facility] OR;  Service: General;  Laterality: N/A;   BIOPSY  10/04/2023   Procedure: BIOPSY;  Surgeon: Shaaron Lamar HERO, MD;  Location: [facility];  Service: Endoscopy;;   BREAST BIOPSY Right 06/15/2020   Procedure: RIGHT BREAST BIOPSY;  Surgeon: Mavis Anes, MD;  Location: [facility];  Service: General;  Laterality: Right;   CATARACT EXTRACTION W/PHACO Left 02/09/2017   Procedure: CATARACT EXTRACTION PHACO AND INTRAOCULAR LENS PLACEMENT LEFT EYE;  Surgeon: Perley Hamilton, MD;  Location: [facility];  Service: Ophthalmology;  Laterality: Left;  CDE: 4.89   CATARACT EXTRACTION W/PHACO Right 06/04/2017   Procedure: CATARACT EXTRACTION PHACO AND INTRAOCULAR LENS PLACEMENT (IOC);  Surgeon: Perley Hamilton, MD;  Location:  [facility];  Service: Ophthalmology;  Laterality: Right;  CDE: 4.12   CHOLECYSTECTOMY  09/29/2011   Procedure: LAPAROSCOPIC CHOLECYSTECTOMY;  Surgeon: Oneil DELENA Budge, MD;  Location: [facility];  Service: General;  Laterality: N/A;   COLONOSCOPY  08/2009   Dr. Rollin: sessile polyp (benign lymphoid), large hemorrhoids, repeat 5-10 years   COLONOSCOPY N/A 06/12/2016   prominent hemorrhoids   COLONOSCOPY WITH PROPOFOL  N/A 06/16/2021   Procedure: COLONOSCOPY WITH PROPOFOL ;  Surgeon: Shaaron Lamar HERO, MD;  [facility];  Service: Endoscopy;  Laterality: N/A;  9:30am (dialysis pt)   ESOPHAGOGASTRODUODENOSCOPY  09/05/2011   MFM:Dfjoo  hiatal hernia; remainder of exam normal. No explanation for patient's abdominal pain with today's examination   ESOPHAGOGASTRODUODENOSCOPY N/A 12/17/2013   Dr. Shaaron: gastric erythema, erosion, mild chronic inflammation on path    ESOPHAGOGASTRODUODENOSCOPY N/A 12/31/2023   Procedure: EGD (ESOPHAGOGASTRODUODENOSCOPY);  Surgeon: Wilhelmenia Aloha Raddle., MD;  Location: [facility];  Service: Gastroenterology;  Laterality: N/A;   ESOPHAGOGASTRODUODENOSCOPY (EGD) WITH PROPOFOL  N/A 06/16/2021   Procedure: ESOPHAGOGASTRODUODENOSCOPY (EGD) WITH PROPOFOL ;  Surgeon: Shaaron Lamar HERO, MD;  [facility];  Service: Endoscopy;  Laterality: N/A;   ESOPHAGOGASTRODUODENOSCOPY (EGD) WITH PROPOFOL  N/A 10/04/2023   Procedure: ESOPHAGOGASTRODUODENOSCOPY (EGD) WITH PROPOFOL ;  Surgeon: Shaaron Lamar HERO, MD;  [facility];  Service: Endoscopy;  Laterality: N/A;  200pm, ok rm 1-2, pt knows to arrive at 6:45   EUS N/A 12/31/2023   Procedure: ULTRASOUND, UPPER GI TRACT, ENDOSCOPIC;  Surgeon: Wilhelmenia Aloha Raddle., MD;  Location: [facility];  Service: Gastroenterology;  Laterality: N/A;   EXCISION OF BREAST BIOPSY Right 10/12/2020   Procedure: EXCISION OF RIGHT BREAST BIOPSY;  Surgeon: Budge Oneil, MD;  Location: [facility];  Service: General;  Laterality: Right;   IR DIALY SHUNT INTRO NEEDLE/INTRACATH INITIAL W/IMG LEFT Left 07/17/2023   LAPAROSCOPIC APPENDECTOMY  09/29/2011   Procedure: APPENDECTOMY LAPAROSCOPIC;  Surgeon: Oneil DELENA Budge, MD;  Location: [facility];  Service: General;;  incidental appendectomy   LEFT HEART CATH AND CORONARY ANGIOGRAPHY N/A 02/28/2019   Procedure: LEFT HEART CATH AND CORONARY ANGIOGRAPHY;  Surgeon: Dann Candyce RAMAN, MD;  Location: [facility];  Service: Cardiovascular;  Laterality: N/A;   MASS EXCISION Right 01/18/2023   Procedure: EXCISION MASS, RIGHT CHEST S/P MASTECTOMY;  Surgeon: Budge Oneil, MD;  Location: [facility];  Service: General;  Laterality: Right;    MASTECTOMY MODIFIED RADICAL Right 02/18/2020   Procedure: MASTECTOMY MODIFIED RADICAL;  Surgeon: Budge Oneil, MD;  Location: [facility];  Service: General;  Laterality: Right;   MASTECTOMY, PARTIAL Right 07/13/2020   Procedure: RIGHT PARTIAL MASTECTOMY;  Surgeon: Budge Oneil, MD;  Location: [facility];  Service: General;  Laterality: Right;   PARTIAL MASTECTOMY WITH NEEDLE LOCALIZATION AND AXILLARY SENTINEL LYMPH NODE BX Right 09/18/2018   Procedure: RIGHT PARTIAL MASTECTOMY AFTER NEEDLE LOCALIZATION, SENTINEL LYMPH NODE BIOPSY RIGHT AXILLA;  Surgeon: Budge Oneil, MD;  Location: [facility];  Service: General;  Laterality: Right;   POLYPECTOMY  06/16/2021   Procedure: POLYPECTOMY;  Surgeon: Shaaron Lamar HERO, MD;  [facility];  Service: Endoscopy;;   PORT-A-CATH REMOVAL Right 11/29/2023   Procedure: REMOVAL PORT-A-CATH;  Surgeon: Budge Oneil, MD;  Location: [facility];  Service: General;  Laterality: Right;  MINOR PROCEDURE ROOM   PORTACATH PLACEMENT Right 06/07/2023   Procedure: INSERTION PORT-A-CATH, RIGHT  (DIALYSIS ACCESS ON LEFT);  Surgeon: Budge Oneil, MD;  Location: [facility];  Service: General;  Laterality: Right;   PORTACATH PLACEMENT Right 04/03/2024  Procedure: INSERTION, TUNNELED CENTRAL VENOUS DEVICE, WITH PORT;  Surgeon: Mavis Anes, MD;  Location: [facility];  Service: General;  Laterality: Right;   SIMPLE MASTECTOMY WITH AXILLARY SENTINEL NODE BIOPSY Left 06/15/2020   Procedure: LEFT SIMPLE MASTECTOMY;  Surgeon: Mavis Anes, MD;  Location: [facility];  Service: General;  Laterality: Left;   Current Outpatient Medications on File Prior to Visit  Medication Sig Dispense Refill   acetaminophen  (TYLENOL ) 500 MG tablet Take 1,000 mg by mouth every 8 (eight) hours as needed for moderate pain (pain score 4-6).     albuterol  (PROVENTIL ) (2.5 MG/3ML) 0.083% nebulizer solution Take 3 mLs (2.5 mg total) by nebulization every 4 (four) hours as needed for wheezing or shortness of breath. 75 mL 2    albuterol  (VENTOLIN  HFA) 108 (90 Base) MCG/ACT inhaler Inhale 2 puffs into the lungs every 6 (six) hours as needed for wheezing or shortness of breath. 8 g 2   aspirin  EC 81 MG tablet Take 81 mg by mouth daily.     B Complex-C-Zn-Folic Acid  (DIALYVITE  800-ZINC  15) 0.8 MG TABS Take 1 tablet by mouth daily.     cinacalcet  (SENSIPAR ) 90 MG tablet Take 90 mg by mouth daily.     Darbepoetin Alfa  (ARANESP , ALBUMIN FREE, IJ) Darbepoetin Alfa  (Aranesp )     diphenoxylate -atropine  (LOMOTIL ) 2.5-0.025 MG tablet Take 1 tablet by mouth 4 (four) times daily as needed for diarrhea or loose stools. 120 tablet 0   gabapentin  (NEURONTIN ) 300 MG capsule TAKE 1 CAPSULE(300 MG) BY MOUTH AT BEDTIME 90 capsule 3   guaiFENesin  (MUCINEX ) 600 MG 12 hr tablet Take 1 tablet (600 mg total) by mouth 2 (two) times daily. 60 tablet 2   isosorbide  mononitrate (IMDUR ) 30 MG 24 hr tablet Take 0.5 tablets (15 mg total) by mouth daily. 15 tablet 6   lanthanum  (FOSRENOL ) 1000 MG chewable tablet Chew 2,000-3,000 mg by mouth See admin instructions. Take 3 tablets (3000 mg) by mouth with meals and take 2 tablets (2000 mg) with snacks     loperamide  (IMODIUM  A-D) 2 MG tablet Take 2 mg by mouth 4 (four) times daily as needed for diarrhea or loose stools.     nebivolol  (BYSTOLIC ) 10 MG tablet TAKE 1 TABLET BY MOUTH IN THE MORNING AND AT BEDTIME 180 tablet 0   Nebulizers (COMPRESSOR/NEBULIZER) MISC 1 Units by Does not apply route daily as needed. 1 each 0   OLANZapine  (ZYPREXA ) 5 MG tablet Take 1 tablet (5 mg total) by mouth at bedtime. 60 tablet 1   ondansetron  (ZOFRAN -ODT) 4 MG disintegrating tablet Take 1 tablet by mouth every 8 (eight) hours as needed for vomiting or nausea.     oxyCODONE  (ROXICODONE ) 5 MG immediate release tablet Take 1 tablet (5 mg total) by mouth every 12 (twelve) hours as needed. 60 tablet 0   pantoprazole  (PROTONIX ) 40 MG tablet Take 1 tablet (40 mg total) by mouth 2 (two) times daily before a meal. 60 tablet 11    predniSONE  (DELTASONE ) 20 MG tablet Take 2 tablets (40 mg total) by mouth daily with breakfast for 5 days. 10 tablet 0   prochlorperazine  (COMPAZINE ) 10 MG tablet Take 1 tablet (10 mg total) by mouth every 6 (six) hours as needed for nausea or vomiting. 30 tablet 1   amLODipine  (NORVASC ) 5 MG tablet Take 5 mg by mouth at bedtime. (Patient not taking: Reported on 05/20/2024)     atorvastatin  (LIPITOR) 20 MG tablet TAKE 1 TABLET(20 MG) BY MOUTH DAILY 90 tablet 0  cefdinir (OMNICEF) 300 MG capsule Take 1 capsule (300 mg total) by mouth 2 (two) times daily for 5 days. (Patient not taking: Reported on 05/20/2024) 10 capsule 0   fluticasone  (FLONASE ) 50 MCG/ACT nasal spray Place 1 spray into both nostrils daily. (Patient not taking: Reported on 05/20/2024)     guaiFENesin -codeine  100-10 MG/5ML syrup Take 10 mLs by mouth 3 (three) times daily as needed for cough. (Patient not taking: Reported on 05/20/2024) 120 mL 0   lidocaine -prilocaine  (EMLA ) cream APPLY TOPICALLY TO THE AFFECTED AREA 1 TIME (Patient not taking: Reported on 05/20/2024) 30 g 3   meclizine  (ANTIVERT ) 25 MG tablet Take 1 tablet (25 mg total) by mouth 3 (three) times daily as needed for dizziness. (Patient not taking: Reported on 05/20/2024) 30 tablet 1   No current facility-administered medications on file prior to visit.   Allergies  Allergen Reactions   Motrin [Ibuprofen] Other (See Comments)    ESRD    Social History   Socioeconomic History   Marital status: Married    Spouse name: Not on file   Number of children: Not on file   Years of education: Not on file   Highest education level: 12th grade  Occupational History   Occupation: Systems developer: [employer]  Tobacco Use   Smoking status: Never    Passive exposure: Never   Smokeless tobacco: Never  Vaping Use   Vaping status: Never Used  Substance and Sexual Activity   Alcohol use: No   Drug use: No   Sexual activity: Yes    Birth control/protection:  Surgical  Other Topics Concern   Not on file  Social History Narrative   Works at [employer] in [place].    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   Also has to get items from high shelves-causes achy pain in shoulder area      Married.   Children are grown, out of house.   Social Drivers of Corporate investment banker Strain: Low Risk  (09/20/2023)   Overall Financial Resource Strain (CARDIA)    Difficulty of Paying Living Expenses: Not hard at all  Food Insecurity: No Food Insecurity (05/16/2024)   Hunger Vital Sign    Worried About Running Out of Food in the Last Year: Never true    Ran Out of Food in the Last Year: Never true  Transportation Needs: No Transportation Needs (05/16/2024)   PRAPARE - Administrator, Civil Service (Medical): No    Lack of Transportation (Non-Medical): No  Physical Activity: Inactive (09/20/2023)   Exercise Vital Sign    Days of Exercise per Week: 0 days    Minutes of Exercise per Session: 0 min  Stress: No Stress Concern Present (09/20/2023)   [employer] of Occupational Health - Occupational Stress Questionnaire    Feeling of Stress : Not at all  Social Connections: Socially Integrated (05/12/2024)   Social Connection and Isolation Panel    Frequency of Communication with Friends and Family: More than three times a week    Frequency of Social Gatherings with Friends and Family: More than three times a week    Attends Religious Services: More than 4 times per year    Active Member of [employer] or Organizations: Yes    Attends Banker Meetings: More than 4 times per year    Marital Status: Married  Catering manager Violence: Not At Risk (05/16/2024)  Humiliation, Afraid, Rape, and Kick questionnaire    Fear of Current or Ex-Partner: No    Emotionally Abused: No    Physically Abused: No    Sexually Abused: No    Review of Systems  All other systems reviewed and are negative.      Objective:   Physical  Exam Vitals reviewed.  Constitutional:      General: She is not in acute distress.    Appearance: Normal appearance. She is normal weight.  Cardiovascular:     Rate and Rhythm: Normal rate and regular rhythm.     Pulses: Normal pulses.     Heart sounds: Murmur (thrill from AV fistual heard as murmur) heard.  Pulmonary:     Effort: Pulmonary effort is normal. No respiratory distress.     Breath sounds: Normal breath sounds. No stridor. No wheezing, rhonchi or rales.  Chest:     Chest wall: No tenderness.  Musculoskeletal:     Right lower leg: No edema.     Left lower leg: No edema.     Right foot: Normal range of motion. No deformity.  Feet:     Right foot:     Skin integrity: No ulcer, blister, skin breakdown, erythema, warmth or callus.     Left foot:     Skin integrity: No ulcer, blister, skin breakdown, erythema, warmth or callus.  Neurological:     Mental Status: She is alert.           Assessment & Plan:  Carcinoma of breast metastatic to lung, unspecified laterality (HCC) I explained to the patient that I feel her cough and shortness of breath is likely due to the extensive metastases seen on the CT scan.  I do not believe additional antibiotics would be better official.  I believe the best way to try to address her symptoms would be through her oncologist.  She states that they are changing her chemotherapy regimen this week.  I will try Hycodan 1 teaspoon every 6-8 hours as needed for cough for symptomatic relief..  Also wrote the patient a prescription for portable wheelchair

## 2024-05-21 DIAGNOSIS — D509 Iron deficiency anemia, unspecified: Secondary | ICD-10-CM | POA: Diagnosis not present

## 2024-05-21 DIAGNOSIS — D631 Anemia in chronic kidney disease: Secondary | ICD-10-CM | POA: Diagnosis not present

## 2024-05-21 DIAGNOSIS — E1129 Type 2 diabetes mellitus with other diabetic kidney complication: Secondary | ICD-10-CM | POA: Diagnosis not present

## 2024-05-21 DIAGNOSIS — N2581 Secondary hyperparathyroidism of renal origin: Secondary | ICD-10-CM | POA: Diagnosis not present

## 2024-05-21 DIAGNOSIS — Z992 Dependence on renal dialysis: Secondary | ICD-10-CM | POA: Diagnosis not present

## 2024-05-21 DIAGNOSIS — N186 End stage renal disease: Secondary | ICD-10-CM | POA: Diagnosis not present

## 2024-05-22 ENCOUNTER — Ambulatory Visit (HOSPITAL_COMMUNITY)
Admission: RE | Admit: 2024-05-22 | Discharge: 2024-05-22 | Disposition: A | Discharge status: Home / Self Care | Source: Ambulatory Visit | Attending: Oncology | Admitting: Oncology

## 2024-05-22 ENCOUNTER — Other Ambulatory Visit: Payer: Self-pay | Admitting: *Deleted

## 2024-05-22 ENCOUNTER — Other Ambulatory Visit: Payer: Self-pay | Admitting: Oncology

## 2024-05-22 DIAGNOSIS — J90 Pleural effusion, not elsewhere classified: Secondary | ICD-10-CM | POA: Diagnosis not present

## 2024-05-22 DIAGNOSIS — I132 Hypertensive heart and chronic kidney disease with heart failure and with stage 5 chronic kidney disease, or end stage renal disease: Secondary | ICD-10-CM | POA: Diagnosis present

## 2024-05-22 DIAGNOSIS — C799 Secondary malignant neoplasm of unspecified site: Secondary | ICD-10-CM | POA: Insufficient documentation

## 2024-05-22 DIAGNOSIS — J189 Pneumonia, unspecified organism: Secondary | ICD-10-CM | POA: Diagnosis not present

## 2024-05-22 DIAGNOSIS — Z515 Encounter for palliative care: Secondary | ICD-10-CM | POA: Diagnosis not present

## 2024-05-22 DIAGNOSIS — C50211 Malignant neoplasm of upper-inner quadrant of right female breast: Secondary | ICD-10-CM | POA: Insufficient documentation

## 2024-05-22 DIAGNOSIS — I2510 Atherosclerotic heart disease of native coronary artery without angina pectoris: Secondary | ICD-10-CM | POA: Diagnosis not present

## 2024-05-22 DIAGNOSIS — C50911 Malignant neoplasm of unspecified site of right female breast: Secondary | ICD-10-CM

## 2024-05-22 DIAGNOSIS — J470 Bronchiectasis with acute lower respiratory infection: Secondary | ICD-10-CM | POA: Diagnosis present

## 2024-05-22 DIAGNOSIS — Z6833 Body mass index (BMI) 33.0-33.9, adult: Secondary | ICD-10-CM | POA: Diagnosis not present

## 2024-05-22 DIAGNOSIS — D631 Anemia in chronic kidney disease: Secondary | ICD-10-CM | POA: Diagnosis not present

## 2024-05-22 DIAGNOSIS — Z79899 Other long term (current) drug therapy: Secondary | ICD-10-CM | POA: Diagnosis not present

## 2024-05-22 DIAGNOSIS — G893 Neoplasm related pain (acute) (chronic): Secondary | ICD-10-CM | POA: Diagnosis not present

## 2024-05-22 DIAGNOSIS — C787 Secondary malignant neoplasm of liver and intrahepatic bile duct: Secondary | ICD-10-CM | POA: Diagnosis present

## 2024-05-22 DIAGNOSIS — E782 Mixed hyperlipidemia: Secondary | ICD-10-CM | POA: Diagnosis present

## 2024-05-22 DIAGNOSIS — K219 Gastro-esophageal reflux disease without esophagitis: Secondary | ICD-10-CM | POA: Diagnosis present

## 2024-05-22 DIAGNOSIS — R0602 Shortness of breath: Secondary | ICD-10-CM | POA: Diagnosis not present

## 2024-05-22 DIAGNOSIS — E1122 Type 2 diabetes mellitus with diabetic chronic kidney disease: Secondary | ICD-10-CM | POA: Diagnosis present

## 2024-05-22 DIAGNOSIS — J440 Chronic obstructive pulmonary disease with acute lower respiratory infection: Secondary | ICD-10-CM | POA: Diagnosis present

## 2024-05-22 DIAGNOSIS — R053 Chronic cough: Secondary | ICD-10-CM | POA: Diagnosis not present

## 2024-05-22 DIAGNOSIS — J9611 Chronic respiratory failure with hypoxia: Secondary | ICD-10-CM | POA: Diagnosis not present

## 2024-05-22 DIAGNOSIS — Z992 Dependence on renal dialysis: Secondary | ICD-10-CM | POA: Diagnosis not present

## 2024-05-22 DIAGNOSIS — Z171 Estrogen receptor negative status [ER-]: Secondary | ICD-10-CM | POA: Insufficient documentation

## 2024-05-22 DIAGNOSIS — N250 Renal osteodystrophy: Secondary | ICD-10-CM | POA: Diagnosis not present

## 2024-05-22 DIAGNOSIS — C7801 Secondary malignant neoplasm of right lung: Secondary | ICD-10-CM | POA: Diagnosis present

## 2024-05-22 DIAGNOSIS — C7800 Secondary malignant neoplasm of unspecified lung: Secondary | ICD-10-CM | POA: Diagnosis not present

## 2024-05-22 DIAGNOSIS — C7802 Secondary malignant neoplasm of left lung: Secondary | ICD-10-CM | POA: Diagnosis present

## 2024-05-22 DIAGNOSIS — C7951 Secondary malignant neoplasm of bone: Secondary | ICD-10-CM | POA: Diagnosis not present

## 2024-05-22 DIAGNOSIS — I120 Hypertensive chronic kidney disease with stage 5 chronic kidney disease or end stage renal disease: Secondary | ICD-10-CM | POA: Diagnosis not present

## 2024-05-22 DIAGNOSIS — C50919 Malignant neoplasm of unspecified site of unspecified female breast: Secondary | ICD-10-CM | POA: Diagnosis not present

## 2024-05-22 DIAGNOSIS — Z9981 Dependence on supplemental oxygen: Secondary | ICD-10-CM | POA: Diagnosis not present

## 2024-05-22 DIAGNOSIS — Z7189 Other specified counseling: Secondary | ICD-10-CM | POA: Diagnosis not present

## 2024-05-22 DIAGNOSIS — Z1152 Encounter for screening for COVID-19: Secondary | ICD-10-CM | POA: Diagnosis not present

## 2024-05-22 DIAGNOSIS — C771 Secondary and unspecified malignant neoplasm of intrathoracic lymph nodes: Secondary | ICD-10-CM | POA: Diagnosis present

## 2024-05-22 DIAGNOSIS — B370 Candidal stomatitis: Secondary | ICD-10-CM | POA: Diagnosis present

## 2024-05-22 DIAGNOSIS — I5032 Chronic diastolic (congestive) heart failure: Secondary | ICD-10-CM | POA: Diagnosis not present

## 2024-05-22 DIAGNOSIS — R0600 Dyspnea, unspecified: Secondary | ICD-10-CM | POA: Diagnosis not present

## 2024-05-22 DIAGNOSIS — Z7982 Long term (current) use of aspirin: Secondary | ICD-10-CM | POA: Diagnosis not present

## 2024-05-22 DIAGNOSIS — R918 Other nonspecific abnormal finding of lung field: Secondary | ICD-10-CM | POA: Diagnosis not present

## 2024-05-22 DIAGNOSIS — N186 End stage renal disease: Secondary | ICD-10-CM | POA: Diagnosis not present

## 2024-05-22 DIAGNOSIS — J441 Chronic obstructive pulmonary disease with (acute) exacerbation: Secondary | ICD-10-CM | POA: Diagnosis present

## 2024-05-22 DIAGNOSIS — I5033 Acute on chronic diastolic (congestive) heart failure: Secondary | ICD-10-CM | POA: Diagnosis present

## 2024-05-22 MED ORDER — LIDOCAINE VISCOUS HCL 2 % MT SOLN
15.0000 mL | OROMUCOSAL | 1 refills | Status: DC | PRN
Start: 2024-05-22 — End: 2024-06-29

## 2024-05-22 MED ORDER — ALUMINUM & MAGNESIUM HYDROXIDE 200-200 MG/5ML PO SUSP: 15.0000 mL | ORAL | 0 refills | Status: DC | PRN

## 2024-05-22 MED ORDER — FLUDEOXYGLUCOSE F - 18 (FDG) INJECTION
8.8200 | Freq: Once | INTRAVENOUS | Status: AC | PRN
  Administered 2024-05-22: 8.82 via INTRAVENOUS

## 2024-05-22 NOTE — Telephone Encounter (Signed)
 Patient called to advise of sores in mouth associated with pain.  Per protocol, lidocaine  2% 1:1 with Maalox swish and spit sent to pharmacy.  Patient verbalized understanding of instructions.

## 2024-05-22 NOTE — Progress Notes (Signed)
 DISCONTINUE OFF PATHWAY REGIMEN - Breast   [id number]:$[id number]ZjvdNfLh$$MzfnczAzqnmzIZPI_KGBFmdDdacTeHNmWpTpGTwfKZjvdNfLh$  AUC=6 IV D1 + Docetaxel  75 mg/m2 IV D1 q21 Days:   A cycle is every 21 days:     Docetaxel       Carboplatin    **Always confirm dose/schedule in your pharmacy ordering system**  PRIOR TREATMENT: Off Pathway: Carboplatin  AUC=6 IV D1 + Docetaxel  75 mg/m2 IV D1 q21 Days  START OFF PATHWAY REGIMEN - Breast   [id number]:Sacituzumab govitecan 10 mg/kg IV D1,8 q21 Days:   A cycle is every 21 days:     Sacituzumab govitecan-hziy   **Always confirm dose/schedule in your pharmacy ordering system**  Patient Characteristics: Distant Metastases or Locoregional Recurrent Disease - Unresectable, M0 or Locally Advanced Unresectable Disease Progressing after Neoadjuvant and Local Therapies, M0, HER2 Negative/Ultralow/Low, ER Negative, Molecular Alterations (BRAFm, MSI-H/dMMR,  TMB-H, or NTRK/RET Gene Fusion), All Mutations/Biomarkers Negative Therapeutic Status: Distant Metastases HER2 Status: Negative (-) ER Status: Negative (-) PR Status: Negative (-) Therapy Approach Indicated: Standard Therapy Exhausted and Molecular Alterations Present (BRAFm, MSI-H/dMMR, TMB-H, or NTRK/RET Gene Fusion) Microsatellite/Mismatch Repair Status: MSS/pMMR NTRK Gene Fusion Status: Negative Other Mutations/Biomarkers: No Other Actionable Mutations Tumor Mutational Burden (TMB): TMB-L RET Gene Fusion Status: Negative BRAF V600E Mutation Status: Negative Intent of Therapy: Non-Curative / Palliative Intent, Discussed with Patient

## 2024-05-23 ENCOUNTER — Other Ambulatory Visit: Payer: Self-pay

## 2024-05-23 ENCOUNTER — Encounter (HOSPITAL_COMMUNITY): Payer: Self-pay

## 2024-05-23 ENCOUNTER — Inpatient Hospital Stay (HOSPITAL_COMMUNITY)
Admission: EM | Admit: 2024-05-23 | Discharge: 2024-05-26 | DRG: 193 | Disposition: A | Discharge status: Home / Self Care | Attending: Internal Medicine | Admitting: Internal Medicine

## 2024-05-23 ENCOUNTER — Emergency Department (HOSPITAL_COMMUNITY)

## 2024-05-23 ENCOUNTER — Telehealth: Payer: Self-pay

## 2024-05-23 DIAGNOSIS — Z7189 Other specified counseling: Secondary | ICD-10-CM | POA: Diagnosis not present

## 2024-05-23 DIAGNOSIS — J189 Pneumonia, unspecified organism: Secondary | ICD-10-CM | POA: Diagnosis present

## 2024-05-23 DIAGNOSIS — R591 Generalized enlarged lymph nodes: Secondary | ICD-10-CM

## 2024-05-23 DIAGNOSIS — C787 Secondary malignant neoplasm of liver and intrahepatic bile duct: Secondary | ICD-10-CM | POA: Diagnosis present

## 2024-05-23 DIAGNOSIS — C50919 Malignant neoplasm of unspecified site of unspecified female breast: Secondary | ICD-10-CM

## 2024-05-23 DIAGNOSIS — C50211 Malignant neoplasm of upper-inner quadrant of right female breast: Secondary | ICD-10-CM

## 2024-05-23 DIAGNOSIS — C7951 Secondary malignant neoplasm of bone: Secondary | ICD-10-CM | POA: Diagnosis not present

## 2024-05-23 DIAGNOSIS — Z853 Personal history of malignant neoplasm of breast: Secondary | ICD-10-CM

## 2024-05-23 DIAGNOSIS — B370 Candidal stomatitis: Secondary | ICD-10-CM | POA: Diagnosis present

## 2024-05-23 DIAGNOSIS — E1122 Type 2 diabetes mellitus with diabetic chronic kidney disease: Secondary | ICD-10-CM | POA: Diagnosis present

## 2024-05-23 DIAGNOSIS — C7802 Secondary malignant neoplasm of left lung: Secondary | ICD-10-CM | POA: Diagnosis present

## 2024-05-23 DIAGNOSIS — C7801 Secondary malignant neoplasm of right lung: Secondary | ICD-10-CM | POA: Diagnosis present

## 2024-05-23 DIAGNOSIS — Z9049 Acquired absence of other specified parts of digestive tract: Secondary | ICD-10-CM

## 2024-05-23 DIAGNOSIS — Z9981 Dependence on supplemental oxygen: Secondary | ICD-10-CM | POA: Diagnosis not present

## 2024-05-23 DIAGNOSIS — E782 Mixed hyperlipidemia: Secondary | ICD-10-CM | POA: Diagnosis present

## 2024-05-23 DIAGNOSIS — I5032 Chronic diastolic (congestive) heart failure: Secondary | ICD-10-CM

## 2024-05-23 DIAGNOSIS — Z515 Encounter for palliative care: Secondary | ICD-10-CM

## 2024-05-23 DIAGNOSIS — N186 End stage renal disease: Secondary | ICD-10-CM | POA: Diagnosis present

## 2024-05-23 DIAGNOSIS — I132 Hypertensive heart and chronic kidney disease with heart failure and with stage 5 chronic kidney disease, or end stage renal disease: Secondary | ICD-10-CM | POA: Diagnosis present

## 2024-05-23 DIAGNOSIS — Z6833 Body mass index (BMI) 33.0-33.9, adult: Secondary | ICD-10-CM | POA: Diagnosis not present

## 2024-05-23 DIAGNOSIS — D631 Anemia in chronic kidney disease: Secondary | ICD-10-CM | POA: Diagnosis present

## 2024-05-23 DIAGNOSIS — Z8701 Personal history of pneumonia (recurrent): Secondary | ICD-10-CM

## 2024-05-23 DIAGNOSIS — Z886 Allergy status to analgesic agent status: Secondary | ICD-10-CM

## 2024-05-23 DIAGNOSIS — I5033 Acute on chronic diastolic (congestive) heart failure: Secondary | ICD-10-CM | POA: Diagnosis present

## 2024-05-23 DIAGNOSIS — Z992 Dependence on renal dialysis: Secondary | ICD-10-CM

## 2024-05-23 DIAGNOSIS — C7800 Secondary malignant neoplasm of unspecified lung: Secondary | ICD-10-CM | POA: Diagnosis not present

## 2024-05-23 DIAGNOSIS — J470 Bronchiectasis with acute lower respiratory infection: Secondary | ICD-10-CM | POA: Diagnosis present

## 2024-05-23 DIAGNOSIS — I2510 Atherosclerotic heart disease of native coronary artery without angina pectoris: Secondary | ICD-10-CM | POA: Diagnosis present

## 2024-05-23 DIAGNOSIS — Z7982 Long term (current) use of aspirin: Secondary | ICD-10-CM

## 2024-05-23 DIAGNOSIS — Z9011 Acquired absence of right breast and nipple: Secondary | ICD-10-CM

## 2024-05-23 DIAGNOSIS — Z1152 Encounter for screening for COVID-19: Secondary | ICD-10-CM | POA: Diagnosis not present

## 2024-05-23 DIAGNOSIS — Z90710 Acquired absence of both cervix and uterus: Secondary | ICD-10-CM

## 2024-05-23 DIAGNOSIS — J441 Chronic obstructive pulmonary disease with (acute) exacerbation: Secondary | ICD-10-CM | POA: Diagnosis present

## 2024-05-23 DIAGNOSIS — K219 Gastro-esophageal reflux disease without esophagitis: Secondary | ICD-10-CM | POA: Diagnosis present

## 2024-05-23 DIAGNOSIS — G893 Neoplasm related pain (acute) (chronic): Principal | ICD-10-CM | POA: Diagnosis present

## 2024-05-23 DIAGNOSIS — J440 Chronic obstructive pulmonary disease with acute lower respiratory infection: Secondary | ICD-10-CM | POA: Diagnosis present

## 2024-05-23 DIAGNOSIS — Z79899 Other long term (current) drug therapy: Secondary | ICD-10-CM

## 2024-05-23 DIAGNOSIS — Z8249 Family history of ischemic heart disease and other diseases of the circulatory system: Secondary | ICD-10-CM

## 2024-05-23 DIAGNOSIS — J9611 Chronic respiratory failure with hypoxia: Secondary | ICD-10-CM | POA: Diagnosis present

## 2024-05-23 DIAGNOSIS — Z800 Family history of malignant neoplasm of digestive organs: Secondary | ICD-10-CM

## 2024-05-23 DIAGNOSIS — R053 Chronic cough: Secondary | ICD-10-CM | POA: Diagnosis not present

## 2024-05-23 DIAGNOSIS — Z833 Family history of diabetes mellitus: Secondary | ICD-10-CM

## 2024-05-23 DIAGNOSIS — M792 Neuralgia and neuritis, unspecified: Secondary | ICD-10-CM

## 2024-05-23 DIAGNOSIS — C771 Secondary and unspecified malignant neoplasm of intrathoracic lymph nodes: Secondary | ICD-10-CM | POA: Diagnosis present

## 2024-05-23 DIAGNOSIS — R0602 Shortness of breath: Secondary | ICD-10-CM | POA: Diagnosis not present

## 2024-05-23 DIAGNOSIS — E66811 Obesity, class 1: Secondary | ICD-10-CM | POA: Diagnosis present

## 2024-05-23 LAB — CBC WITH DIFFERENTIAL/PLATELET
Abs Immature Granulocytes: 0.38 K/uL — ABNORMAL HIGH (ref 0.00–0.07)
Basophils Absolute: 0.1 K/uL (ref 0.0–0.1)
Basophils Relative: 0 %
Eosinophils Absolute: 0.5 K/uL (ref 0.0–0.5)
Eosinophils Relative: 3 %
HCT: 31.2 % — ABNORMAL LOW (ref 36.0–46.0)
Hemoglobin: 9.7 g/dL — ABNORMAL LOW (ref 12.0–15.0)
Immature Granulocytes: 2 %
Lymphocytes Relative: 7 %
Lymphs Abs: 1.4 K/uL (ref 0.7–4.0)
MCH: 35.5 pg — ABNORMAL HIGH (ref 26.0–34.0)
MCHC: 31.1 g/dL (ref 30.0–36.0)
MCV: 114.3 fL — ABNORMAL HIGH (ref 80.0–100.0)
Monocytes Absolute: 2.1 K/uL — ABNORMAL HIGH (ref 0.1–1.0)
Monocytes Relative: 11 %
Neutro Abs: 15.2 K/uL — ABNORMAL HIGH (ref 1.7–7.7)
Neutrophils Relative %: 77 %
Platelets: 264 K/uL (ref 150–400)
RBC: 2.73 MIL/uL — ABNORMAL LOW (ref 3.87–5.11)
RDW: 18.6 % — ABNORMAL HIGH (ref 11.5–15.5)
Smear Review: NORMAL
WBC: 19.6 K/uL — ABNORMAL HIGH (ref 4.0–10.5)
nRBC: 0.3 % — ABNORMAL HIGH (ref 0.0–0.2)

## 2024-05-23 LAB — RESP PANEL BY RT-PCR (RSV, FLU A&B, COVID)  RVPGX2
Influenza A by PCR: NEGATIVE
Influenza B by PCR: NEGATIVE
Resp Syncytial Virus by PCR: NEGATIVE
SARS Coronavirus 2 by RT PCR: NEGATIVE

## 2024-05-23 LAB — COMPREHENSIVE METABOLIC PANEL WITH GFR
ALT: 26 U/L (ref 0–44)
AST: 32 U/L (ref 15–41)
Albumin: 3.6 g/dL (ref 3.5–5.0)
Alkaline Phosphatase: 131 U/L — ABNORMAL HIGH (ref 38–126)
Anion gap: 18 — ABNORMAL HIGH (ref 5–15)
BUN: 67 mg/dL — ABNORMAL HIGH (ref 8–23)
CO2: 25 mmol/L (ref 22–32)
Calcium: 9.1 mg/dL (ref 8.9–10.3)
Chloride: 95 mmol/L — ABNORMAL LOW (ref 98–111)
Creatinine, Ser: 11 mg/dL — ABNORMAL HIGH (ref 0.44–1.00)
GFR, Estimated: 4 mL/min — ABNORMAL LOW (ref >60)
Glucose, Bld: 127 mg/dL — ABNORMAL HIGH (ref 70–99)
Potassium: 3.8 mmol/L (ref 3.5–5.1)
Sodium: 138 mmol/L (ref 135–145)
Total Bilirubin: 0.3 mg/dL (ref 0.0–1.2)
Total Protein: 6.7 g/dL (ref 6.5–8.1)

## 2024-05-23 LAB — PRO BRAIN NATRIURETIC PEPTIDE: Pro Brain Natriuretic Peptide: 12747 pg/mL — ABNORMAL HIGH (ref <300.0)

## 2024-05-23 LAB — TROPONIN T, HIGH SENSITIVITY
Troponin T High Sensitivity: 70 ng/L — ABNORMAL HIGH (ref 0–19)
Troponin T High Sensitivity: 67 ng/L — ABNORMAL HIGH (ref 0–19)

## 2024-05-23 LAB — GLUCOSE, CAPILLARY: Glucose-Capillary: 142 mg/dL — ABNORMAL HIGH (ref 70–99)

## 2024-05-23 LAB — PROCALCITONIN: Procalcitonin: 1.31 ng/mL

## 2024-05-23 MED ORDER — NEBIVOLOL HCL 10 MG PO TABS
10.0000 mg | ORAL_TABLET | Freq: Every day | ORAL | Status: DC
  Administered 2024-05-23 – 2024-05-26 (×4): 10 mg via ORAL
  Filled 2024-05-23 (×4): qty 1

## 2024-05-23 MED ORDER — LANTHANUM CARBONATE 500 MG PO CHEW
3000.0000 mg | CHEWABLE_TABLET | Freq: Three times a day (TID) | ORAL | Status: DC
  Administered 2024-05-23 – 2024-05-26 (×9): 3000 mg via ORAL
  Filled 2024-05-23 (×13): qty 6

## 2024-05-23 MED ORDER — SODIUM CHLORIDE 0.9 % IV SOLN: 250.0000 mL | INTRAVENOUS | Status: AC | PRN

## 2024-05-23 MED ORDER — ACETAMINOPHEN 325 MG PO TABS
650.0000 mg | ORAL_TABLET | Freq: Four times a day (QID) | ORAL | Status: DC | PRN
  Administered 2024-05-25 – 2024-05-26 (×2): 650 mg via ORAL
  Filled 2024-05-23 (×3): qty 2

## 2024-05-23 MED ORDER — MAGIC MOUTHWASH W/LIDOCAINE
5.0000 mL | Freq: Three times a day (TID) | ORAL | Status: DC | PRN
  Administered 2024-05-23 – 2024-05-24 (×2): 5 mL via ORAL
  Filled 2024-05-23 (×3): qty 5

## 2024-05-23 MED ORDER — PENTAFLUOROPROP-TETRAFLUOROETH EX AERO
1.0000 | INHALATION_SPRAY | CUTANEOUS | Status: DC | PRN
  Administered 2024-05-26: 1 via TOPICAL
  Filled 2024-05-23: qty 30

## 2024-05-23 MED ORDER — ISOSORBIDE MONONITRATE ER 30 MG PO TB24
15.0000 mg | ORAL_TABLET | Freq: Every day | ORAL | Status: DC
  Administered 2024-05-23 – 2024-05-26 (×3): 15 mg via ORAL
  Filled 2024-05-23 (×4): qty 1

## 2024-05-23 MED ORDER — SODIUM CHLORIDE 0.9 % IV SOLN
2.0000 g | Freq: Once | INTRAVENOUS | Status: AC
  Administered 2024-05-23: 2 g via INTRAVENOUS
  Filled 2024-05-23: qty 12.5

## 2024-05-23 MED ORDER — ONDANSETRON HCL 4 MG/2ML IJ SOLN: 4.0000 mg | Freq: Four times a day (QID) | INTRAMUSCULAR | Status: DC | PRN

## 2024-05-23 MED ORDER — DM-GUAIFENESIN ER 30-600 MG PO TB12
1.0000 | ORAL_TABLET | Freq: Two times a day (BID) | ORAL | Status: DC
  Administered 2024-05-23 – 2024-05-24 (×3): 1 via ORAL
  Filled 2024-05-23 (×3): qty 1

## 2024-05-23 MED ORDER — CINACALCET HCL 30 MG PO TABS
90.0000 mg | ORAL_TABLET | Freq: Every day | ORAL | Status: DC
Start: 2024-05-23 — End: 2024-05-26
  Administered 2024-05-23 – 2024-05-26 (×4): 90 mg via ORAL
  Filled 2024-05-23 (×4): qty 3

## 2024-05-23 MED ORDER — HYDROCOD POLI-CHLORPHE POLI ER 10-8 MG/5ML PO SUER
5.0000 mL | Freq: Two times a day (BID) | ORAL | Status: DC | PRN
Start: 2024-05-23 — End: 2024-05-26
  Administered 2024-05-24 – 2024-05-26 (×4): 5 mL via ORAL
  Filled 2024-05-23 (×4): qty 5

## 2024-05-23 MED ORDER — HEPARIN SODIUM (PORCINE) 5000 UNIT/ML IJ SOLN
5000.0000 [IU] | Freq: Three times a day (TID) | INTRAMUSCULAR | Status: DC
  Administered 2024-05-23: 5000 [IU] via SUBCUTANEOUS
  Filled 2024-05-23 (×2): qty 1

## 2024-05-23 MED ORDER — ATORVASTATIN CALCIUM 20 MG PO TABS
20.0000 mg | ORAL_TABLET | Freq: Every day | ORAL | Status: DC
  Administered 2024-05-23 – 2024-05-26 (×4): 20 mg via ORAL
  Filled 2024-05-23: qty 2
  Filled 2024-05-23: qty 1
  Filled 2024-05-23 (×2): qty 2

## 2024-05-23 MED ORDER — GABAPENTIN 300 MG PO CAPS
300.0000 mg | ORAL_CAPSULE | Freq: Every day | ORAL | Status: DC
  Administered 2024-05-23 – 2024-05-25 (×3): 300 mg via ORAL
  Filled 2024-05-23 (×3): qty 1

## 2024-05-23 MED ORDER — HYDROCOD POLI-CHLORPHE POLI ER 10-8 MG/5ML PO SUER
5.0000 mL | Freq: Once | ORAL | Status: AC
  Administered 2024-05-23: 5 mL via ORAL
  Filled 2024-05-23: qty 5

## 2024-05-23 MED ORDER — ONDANSETRON HCL 4 MG PO TABS: 4.0000 mg | ORAL_TABLET | Freq: Four times a day (QID) | ORAL | Status: DC | PRN

## 2024-05-23 MED ORDER — ASPIRIN 81 MG PO TBEC
81.0000 mg | DELAYED_RELEASE_TABLET | Freq: Every day | ORAL | Status: DC
  Administered 2024-05-23 – 2024-05-26 (×4): 81 mg via ORAL
  Filled 2024-05-23 (×4): qty 1

## 2024-05-23 MED ORDER — ACETAMINOPHEN 650 MG RE SUPP: 650.0000 mg | Freq: Four times a day (QID) | RECTAL | Status: DC | PRN

## 2024-05-23 MED ORDER — SODIUM CHLORIDE 0.9% FLUSH
3.0000 mL | Freq: Two times a day (BID) | INTRAVENOUS | Status: DC
  Administered 2024-05-23 – 2024-05-26 (×7): 3 mL via INTRAVENOUS

## 2024-05-23 MED ORDER — PANTOPRAZOLE SODIUM 40 MG PO TBEC
40.0000 mg | DELAYED_RELEASE_TABLET | Freq: Two times a day (BID) | ORAL | Status: DC
  Administered 2024-05-23 – 2024-05-26 (×6): 40 mg via ORAL
  Filled 2024-05-23 (×6): qty 1

## 2024-05-23 MED ORDER — LIDOCAINE HCL (PF) 1 % IJ SOLN
5.0000 mL | INTRAMUSCULAR | Status: DC | PRN
Start: 2024-05-23 — End: 2024-05-26

## 2024-05-23 MED ORDER — OLANZAPINE 5 MG PO TABS
5.0000 mg | ORAL_TABLET | Freq: Every day | ORAL | Status: DC
  Administered 2024-05-23 – 2024-05-25 (×3): 5 mg via ORAL
  Filled 2024-05-23 (×3): qty 1

## 2024-05-23 MED ORDER — HYDROMORPHONE HCL 1 MG/ML IJ SOLN
1.0000 mg | Freq: Once | INTRAMUSCULAR | Status: AC
  Administered 2024-05-23: 1 mg via INTRAVENOUS
  Filled 2024-05-23: qty 1

## 2024-05-23 MED ORDER — BUDESONIDE 0.5 MG/2ML IN SUSP
0.5000 mg | Freq: Two times a day (BID) | RESPIRATORY_TRACT | Status: DC
Start: 2024-05-23 — End: 2024-05-26
  Administered 2024-05-23 – 2024-05-26 (×7): 0.5 mg via RESPIRATORY_TRACT
  Filled 2024-05-23 (×8): qty 2

## 2024-05-23 MED ORDER — NYSTATIN 100000 UNIT/ML MT SUSP
5.0000 mL | Freq: Four times a day (QID) | OROMUCOSAL | Status: DC
  Administered 2024-05-23 – 2024-05-26 (×11): 500000 [IU] via ORAL
  Filled 2024-05-23 (×11): qty 5

## 2024-05-23 MED ORDER — OXYCODONE HCL 5 MG PO TABS
5.0000 mg | ORAL_TABLET | ORAL | Status: DC | PRN
  Administered 2024-05-23: 5 mg via ORAL
  Filled 2024-05-23 (×2): qty 1

## 2024-05-23 MED ORDER — PHENOL 1.4 % MT LIQD
1.0000 | OROMUCOSAL | Status: DC | PRN
  Filled 2024-05-23: qty 177

## 2024-05-23 MED ORDER — METHYLPREDNISOLONE SODIUM SUCC 40 MG IJ SOLR
40.0000 mg | Freq: Every day | INTRAMUSCULAR | Status: DC
  Administered 2024-05-23 – 2024-05-26 (×4): 40 mg via INTRAVENOUS
  Filled 2024-05-23 (×4): qty 1

## 2024-05-23 MED ORDER — IOHEXOL 350 MG/ML SOLN
75.0000 mL | Freq: Once | INTRAVENOUS | Status: AC | PRN
  Administered 2024-05-23: 75 mL via INTRAVENOUS

## 2024-05-23 MED ORDER — RENA-VITE PO TABS
1.0000 | ORAL_TABLET | Freq: Every day | ORAL | Status: DC
Start: 2024-05-23 — End: 2024-05-26
  Administered 2024-05-23 – 2024-05-26 (×4): 1 via ORAL
  Filled 2024-05-23 (×4): qty 1

## 2024-05-23 MED ORDER — OXYCODONE HCL 5 MG PO TABS
5.0000 mg | ORAL_TABLET | Freq: Four times a day (QID) | ORAL | Status: DC | PRN
Start: 2024-05-23 — End: 2024-05-23

## 2024-05-23 MED ORDER — SODIUM CHLORIDE 0.9% FLUSH: 3.0000 mL | INTRAVENOUS | Status: DC | PRN

## 2024-05-23 MED ORDER — HYDROMORPHONE HCL 1 MG/ML IJ SOLN
1.0000 mg | INTRAMUSCULAR | Status: DC | PRN
  Administered 2024-05-23 – 2024-05-25 (×2): 1 mg via INTRAVENOUS
  Filled 2024-05-23 (×2): qty 1

## 2024-05-23 MED ORDER — LANTHANUM CARBONATE 500 MG PO CHEW
2000.0000 mg | CHEWABLE_TABLET | Freq: Every day | ORAL | Status: DC | PRN
Start: 2024-05-23 — End: 2024-05-26

## 2024-05-23 MED ORDER — FENTANYL 50 MCG/HR TD PT72
1.0000 | MEDICATED_PATCH | TRANSDERMAL | Status: DC
Start: 2024-05-23 — End: 2024-05-26
  Administered 2024-05-23 – 2024-05-26 (×2): 1 via TRANSDERMAL
  Filled 2024-05-23 (×2): qty 1

## 2024-05-23 MED ORDER — LIDOCAINE-PRILOCAINE 2.5-2.5 % EX CREA: 1.0000 | TOPICAL_CREAM | CUTANEOUS | Status: DC | PRN

## 2024-05-23 MED ORDER — IPRATROPIUM-ALBUTEROL 0.5-2.5 (3) MG/3ML IN SOLN
3.0000 mL | Freq: Four times a day (QID) | RESPIRATORY_TRACT | Status: DC | PRN
  Administered 2024-05-23: 3 mL via RESPIRATORY_TRACT
  Filled 2024-05-23 (×3): qty 3

## 2024-05-23 MED ORDER — SODIUM CHLORIDE 0.9 % IV SOLN
1.0000 g | INTRAVENOUS | Status: DC
  Administered 2024-05-24 – 2024-05-26 (×3): 1 g via INTRAVENOUS
  Filled 2024-05-23 (×4): qty 10

## 2024-05-23 NOTE — Progress Notes (Signed)
 Pt receives out-pt HD at [facility], MWF, 0530am chair time. Contacted clinic to inform of pt arrival. Will continue to assist as needed.   Eddi Hymes Dialysis Navigator [phone] [facility] [phone]

## 2024-05-23 NOTE — H&P (Signed)
 History and Physical    Patient: Megan [name] FMW:[id number] DOB: Jan 07, 1961 DOA: 05/23/2024 DOS: the patient was seen and examined on 05/23/2024 PCP: Duanne Butler DASEN, MD   Patient coming from: Home  Chief Complaint:  Chief Complaint  Patient presents with   Shortness of Breath   HPI: Megan [name] is a 63 y.o. female with medical history significant of chronic diastolic heart failure, anemia of chronic disease, ESRD, GERD, hypertension, mixed hyperlipidemia, COPD with chronic respiratory failure and hypoxia, class I obesity, history of nonobstructive coronary artery disease and metastatic breast cancer; who presented to the hospital secondary to worsening shortness of breath and associated chest pain. Patient described her pain to be pleuritic in nature and worsened with coughing spells and deep breath.  The pain has been present for the last 4-5 days and worsening.  Patient reported no fever, but expresses having some chills.  There is no nausea, no vomiting, no focal weaknesses, no sick contacts, no diarrhea, no melena, no hematochezia or any other complaints.  Of note, recent hospitalization for pneumonia.  Patient reported finishing discharge antibiotics and steroids therapy prescribed on 05/19/2024.  Patient was on her way to dialysis and unfortunately due to ongoing discomfort decided to come to the hospital for further evaluation and management.  Workup in the ED demonstrating elevated WBCs at 19,000; Respiratory rate increased to 23-25 breaths/min; mild tachycardia and good saturation on chronic supplementation (2-3 L at baseline).  Antibiotics given, cultures taken and CT scan of the chest to rule out pulmonary embolism and given pleuritic discomfort has been ordered.  Nephrology service consulted for dialysis resumption while inpatient.  [facility] contacted to place patient in the hospital for further evaluation and management.   Review of Systems: As mentioned in the  history of present illness. All other systems reviewed and are negative. Past Medical History:  Diagnosis Date   (HFpEF) heart failure with preserved ejection fraction (HCC)    a. 01/2019 Echo: EF 55-60%, mild conc LVH. DD.  Torn MV chordae.   Anemia    Atypical chest pain    a. 08/2018 MV: EF 59%, no ischemia; b. 02/2019 Cath: nonobs dzs.   Blood transfusion without reported diagnosis    Breast cancer (HCC) 10/12/2020   Cataract    ESRD (end stage renal disease) on dialysis [facility])    a. HD T, T, S   Essential hypertension, benign    GERD (gastroesophageal reflux disease)    Headache    Hemorrhoids    Mixed hyperlipidemia    Morbid obesity (HCC)    Non-obstructive CAD (coronary artery disease)    a. 02/2019 CathL LM nl, LAD 36m, LCX nl, RCA 25p, EF 55-65%.   PONV (postoperative nausea and vomiting)    Renal insufficiency    S/P colonoscopy 08/2009   Dr. Rollin: sessile polyp (benign lymphoid), large hemorrhoids, repeat 5-10 years   Temporal arteritis (HCC)    Type 2 diabetes mellitus (HCC)    Wears glasses    Past Surgical History:  Procedure Laterality Date   A/V SHUNT INTERVENTION Left 04/16/2024   Procedure: A/V SHUNT INTERVENTION;  Surgeon: Pearline Norman RAMAN, MD;  Location: [facility];  Service: Cardiovascular;  Laterality: Left;   ABDOMINAL HYSTERECTOMY     APPENDECTOMY     ARTERY BIOPSY N/A 05/09/2018   Procedure: RIGHT TEMPORAL ARTERY BIOPSY;  Surgeon: Kimble Agent, MD;  Location: [facility] OR;  Service: General;  Laterality: N/A;   BIOPSY  10/04/2023   Procedure: BIOPSY;  Surgeon: Shaaron Lamar HERO, MD;  Location: [facility];  Service: Endoscopy;;   BREAST BIOPSY Right 06/15/2020   Procedure: RIGHT BREAST BIOPSY;  Surgeon: Mavis Anes, MD;  Location: [facility];  Service: General;  Laterality: Right;   CATARACT EXTRACTION W/PHACO Left 02/09/2017   Procedure: CATARACT EXTRACTION PHACO AND INTRAOCULAR LENS PLACEMENT LEFT EYE;  Surgeon: Perley Hamilton, MD;  Location: [facility];  Service:  Ophthalmology;  Laterality: Left;  CDE: 4.89   CATARACT EXTRACTION W/PHACO Right 06/04/2017   Procedure: CATARACT EXTRACTION PHACO AND INTRAOCULAR LENS PLACEMENT (IOC);  Surgeon: Perley Hamilton, MD;  Location: [facility];  Service: Ophthalmology;  Laterality: Right;  CDE: 4.12   CHOLECYSTECTOMY  09/29/2011   Procedure: LAPAROSCOPIC CHOLECYSTECTOMY;  Surgeon: Anes DELENA Mavis, MD;  Location: [facility];  Service: General;  Laterality: N/A;   COLONOSCOPY  08/2009   Dr. Rollin: sessile polyp (benign lymphoid), large hemorrhoids, repeat 5-10 years   COLONOSCOPY N/A 06/12/2016   prominent hemorrhoids   COLONOSCOPY WITH PROPOFOL  N/A 06/16/2021   Procedure: COLONOSCOPY WITH PROPOFOL ;  Surgeon: Shaaron Lamar HERO, MD;  [facility];  Service: Endoscopy;  Laterality: N/A;  9:30am (dialysis pt)   ESOPHAGOGASTRODUODENOSCOPY  09/05/2011   MFM:Dfjoo hiatal hernia; remainder of exam normal. No explanation for patient's abdominal pain with today's examination   ESOPHAGOGASTRODUODENOSCOPY N/A 12/17/2013   Dr. Shaaron: gastric erythema, erosion, mild chronic inflammation on path    ESOPHAGOGASTRODUODENOSCOPY N/A 12/31/2023   Procedure: EGD (ESOPHAGOGASTRODUODENOSCOPY);  Surgeon: Wilhelmenia Aloha Raddle., MD;  Location: [facility];  Service: Gastroenterology;  Laterality: N/A;   ESOPHAGOGASTRODUODENOSCOPY (EGD) WITH PROPOFOL  N/A 06/16/2021   Procedure: ESOPHAGOGASTRODUODENOSCOPY (EGD) WITH PROPOFOL ;  Surgeon: Shaaron Lamar HERO, MD;  [facility];  Service: Endoscopy;  Laterality: N/A;   ESOPHAGOGASTRODUODENOSCOPY (EGD) WITH PROPOFOL  N/A 10/04/2023   Procedure: ESOPHAGOGASTRODUODENOSCOPY (EGD) WITH PROPOFOL ;  Surgeon: Shaaron Lamar HERO, MD;  [facility];  Service: Endoscopy;  Laterality: N/A;  200pm, ok rm 1-2, pt knows to arrive at 6:45   EUS N/A 12/31/2023   Procedure: ULTRASOUND, UPPER GI TRACT, ENDOSCOPIC;  Surgeon: Wilhelmenia Aloha Raddle., MD;  Location: [facility];  Service: Gastroenterology;   Laterality: N/A;   EXCISION OF BREAST BIOPSY Right 10/12/2020   Procedure: EXCISION OF RIGHT BREAST BIOPSY;  Surgeon: Mavis Anes, MD;  Location: [facility];  Service: General;  Laterality: Right;   IR DIALY SHUNT INTRO NEEDLE/INTRACATH INITIAL W/IMG LEFT Left 07/17/2023   LAPAROSCOPIC APPENDECTOMY  09/29/2011   Procedure: APPENDECTOMY LAPAROSCOPIC;  Surgeon: Anes DELENA Mavis, MD;  Location: [facility];  Service: General;;  incidental appendectomy   LEFT HEART CATH AND CORONARY ANGIOGRAPHY N/A 02/28/2019   Procedure: LEFT HEART CATH AND CORONARY ANGIOGRAPHY;  Surgeon: Dann Candyce RAMAN, MD;  Location: [facility];  Service: Cardiovascular;  Laterality: N/A;   MASS EXCISION Right 01/18/2023   Procedure: EXCISION MASS, RIGHT CHEST S/P MASTECTOMY;  Surgeon: Mavis Anes, MD;  Location: [facility];  Service: General;  Laterality: Right;   MASTECTOMY MODIFIED RADICAL Right 02/18/2020   Procedure: MASTECTOMY MODIFIED RADICAL;  Surgeon: Mavis Anes, MD;  Location: [facility];  Service: General;  Laterality: Right;   MASTECTOMY, PARTIAL Right 07/13/2020   Procedure: RIGHT PARTIAL MASTECTOMY;  Surgeon: Mavis Anes, MD;  Location: [facility];  Service: General;  Laterality: Right;   PARTIAL MASTECTOMY WITH NEEDLE LOCALIZATION AND AXILLARY SENTINEL LYMPH NODE BX Right 09/18/2018   Procedure: RIGHT PARTIAL MASTECTOMY AFTER NEEDLE LOCALIZATION, SENTINEL LYMPH NODE BIOPSY RIGHT AXILLA;  Surgeon: Mavis Anes, MD;  Location: [facility];  Service: General;  Laterality: Right;   POLYPECTOMY  06/16/2021   Procedure: POLYPECTOMY;  Surgeon: Shaaron Lamar HERO, MD;  [facility];  Service: Endoscopy;;   PORT-A-CATH REMOVAL Right 11/29/2023   Procedure: REMOVAL PORT-A-CATH;  Surgeon: Mavis Anes, MD;  Location: [facility];  Service: General;  Laterality: Right;  MINOR PROCEDURE ROOM   PORTACATH PLACEMENT Right 06/07/2023   Procedure: INSERTION PORT-A-CATH, RIGHT  (DIALYSIS ACCESS ON LEFT);  Surgeon: Mavis Anes, MD;   Location: [facility];  Service: General;  Laterality: Right;   PORTACATH PLACEMENT Right 04/03/2024   Procedure: INSERTION, TUNNELED CENTRAL VENOUS DEVICE, WITH PORT;  Surgeon: Mavis Anes, MD;  Location: [facility];  Service: General;  Laterality: Right;   SIMPLE MASTECTOMY WITH AXILLARY SENTINEL NODE BIOPSY Left 06/15/2020   Procedure: LEFT SIMPLE MASTECTOMY;  Surgeon: Mavis Anes, MD;  Location: [facility];  Service: General;  Laterality: Left;   Social History:  reports that she has never smoked. She has never been exposed to tobacco smoke. She has never used smokeless tobacco. She reports that she does not drink alcohol and does not use drugs.  Allergies  Allergen Reactions   Motrin [Ibuprofen] Other (See Comments)    ESRD     Family History  Problem Relation Age of Onset   Hypertension Mother    Coronary artery disease Mother    Diabetes Mother    Heart attack Father    Hypertension Sister    Coronary artery disease Sister    Hypertension Brother    Colon cancer Brother    Heart attack Maternal Grandmother    Heart attack Maternal Grandfather    Heart attack Paternal Grandmother    Heart attack Paternal Grandfather    Hypertension Son    Heart attack Maternal Aunt    Hypertension Maternal Aunt    Diabetes Maternal Aunt    Heart attack Maternal Uncle    Hypertension Maternal Uncle    Diabetes Maternal Uncle    Heart attack Paternal Aunt    Hypertension Paternal Aunt    Diabetes Paternal Aunt    Heart attack Paternal Uncle    Hypertension Paternal Uncle    Diabetes Paternal Uncle     Prior to Admission medications   Medication Sig Start Date End Date Taking? Authorizing Provider  acetaminophen  (TYLENOL ) 500 MG tablet Take 1,000 mg by mouth every 8 (eight) hours as needed for moderate pain (pain score 4-6).    [provider]  albuterol  (PROVENTIL ) (2.5 MG/3ML) 0.083% nebulizer solution Take 3 mLs (2.5 mg total) by nebulization every 4 (four) hours as needed for  wheezing or shortness of breath. 05/15/24 05/15/25  Pearlean Manus, MD  albuterol  (VENTOLIN  HFA) 108 (90 Base) MCG/ACT inhaler Inhale 2 puffs into the lungs every 6 (six) hours as needed for wheezing or shortness of breath. 05/15/24   Pearlean Manus, MD  aluminum-magnesium  hydroxide 200-200 MG/5ML suspension Take 15 mLs by mouth every 4 (four) hours as needed for indigestion (Mix 1:1 with Lidocaine  and swish and spit). 05/22/24   Davonna Siad, MD  aspirin  EC 81 MG tablet Take 81 mg by mouth daily.    [provider]  atorvastatin  (LIPITOR) 20 MG tablet TAKE 1 TABLET(20 MG) BY MOUTH DAILY 02/11/24   Duanne Butler DASEN, MD  B Complex-C-Zn-Folic Acid  (DIALYVITE  800-ZINC  15) 0.8 MG TABS Take 1 tablet by mouth daily. 06/19/22   [provider]  cinacalcet  (SENSIPAR ) 90 MG tablet Take 90 mg by mouth daily. 03/17/24  [provider]  Darbepoetin Alfa  (ARANESP , ALBUMIN FREE, IJ) Darbepoetin Alfa  (Aranesp ) 07/06/23 07/04/24  [provider]  diphenoxylate -atropine  (LOMOTIL ) 2.5-0.025 MG tablet Take 1 tablet by mouth 4 (four) times daily as needed for diarrhea or loose stools. 05/06/24   Kandala, Hyndavi, MD  gabapentin  (NEURONTIN ) 300 MG capsule TAKE 1 CAPSULE(300 MG) BY MOUTH AT BEDTIME 06/12/23   Duanne Butler DASEN, MD  isosorbide  mononitrate (IMDUR ) 30 MG 24 hr tablet Take 0.5 tablets (15 mg total) by mouth daily. 03/28/24   Alvan Dorn FALCON, MD  lanthanum  (FOSRENOL ) 1000 MG chewable tablet Chew 2,000-3,000 mg by mouth See admin instructions. Take 3 tablets (3000 mg) by mouth with meals and take 2 tablets (2000 mg) with snacks 10/16/19   [provider]  lidocaine  (XYLOCAINE ) 2 % solution Use as directed 15 mLs in the mouth or throat every 4 (four) hours as needed for mouth pain (Mix 1:1 with Maalox and swish and spit). 05/22/24   Davonna Siad, MD  loperamide  (IMODIUM  A-D) 2 MG tablet Take 2 mg by mouth 4 (four) times daily as needed for diarrhea or loose stools.     [provider]  nebivolol  (BYSTOLIC ) 10 MG tablet TAKE 1 TABLET BY MOUTH IN THE MORNING AND AT BEDTIME 02/19/24   Duanne Butler DASEN, MD  Nebulizers (COMPRESSOR/NEBULIZER) MISC 1 Units by Does not apply route daily as needed. 05/15/24   Pearlean Manus, MD  OLANZapine  (ZYPREXA ) 5 MG tablet Take 1 tablet (5 mg total) by mouth at bedtime. 05/06/24   Kandala, Hyndavi, MD  ondansetron  (ZOFRAN -ODT) 4 MG disintegrating tablet Take 1 tablet by mouth every 8 (eight) hours as needed for vomiting or nausea. 04/23/24   [provider]  oxyCODONE  (ROXICODONE ) 5 MG immediate release tablet Take 1 tablet (5 mg total) by mouth every 12 (twelve) hours as needed. 03/19/24 03/19/25  Rogers Hai, MD  pantoprazole  (PROTONIX ) 40 MG tablet Take 1 tablet (40 mg total) by mouth 2 (two) times daily before a meal. 10/08/23   Shaaron Lamar HERO, MD    Physical Exam: Vitals:   05/23/24 0530 05/23/24 0645 05/23/24 1000 05/23/24 1015  BP: (!) 145/78 130/75 124/69 138/69  Pulse: (!) 107 (!) 105 98 (!) 103  Resp: 14 (!) 21    Temp:      SpO2: 95% 94% 94% 93%  Weight:      Height:       General exam: Alert, awake, oriented x 3; in significant distress secondary to ongoing pleuritic pain.  Vital signs demonstrating low-grade temperature. Respiratory system: Positive scattered rhonchi; mild expiratory wheezing appreciated on exam.  No using accessory muscle.  Positive mild tachypnea. Cardiovascular system: Rate controlled, no rubs, no gallops, no JVD on exam. Gastrointestinal system: Abdomen is obese, nondistended, soft and nontender. No organomegaly or masses felt. Normal bowel sounds heard. Central nervous system: Moving 4 limbs spontaneously.  No focal neurological deficits. Extremities: No cyanosis or clubbing; no significant edema on exam.  There is mild swelling in left upper extremity (unchanged from baseline; AVF with good bruit). Skin: No petechiae. Psychiatry: Judgement and insight appear normal.  Mood & affect appropriate.   Data Reviewed: CBC: White blood cells 19.6, hemoglobin 9.7 and platelet count 264K Comprehensive metabolic panel: Sodium 138, potassium 3.8, chloride 95, bicarb 25, BUN 67, creatinine 11, normal LFTs except for alk phos of 131.  GFR 4 Pro-BNP: 12747 High sensitive troponin: 70 >> 67 Respiratory panel by PCR: Negative for COVID, influenza and RSV. Procalcitonin: 1.31  Assessment and Plan: 1-shortness of breath/chronic respiratory failure with hypoxia -Good saturation on chronic supplementation - Patient with elevated WBCs and also elevated procalcitonin level - Low-grade temperature appreciated on examination at time of arrival to ED. - Concerns for postobstructive pneumonia in patient with immunosuppressive status. - On physical exam with positive wheezing and bilateral rhonchi. - There may be also component of vascular congestion given history of chronic diastolic heart failure and dialysis dependency. - Dialysis per nephrology service for volume management. - Continue oxygen  supplementation - Steroids, IV antibiotics, mucolytic's and bronchodilator management will be provided - Incentive spirometer and flutter valve also ordered. - Follow clinical response.  2-pleuritic chest pain/cancer pain - Associated with ongoing metastatic disease - Fentanyl  patch, as needed Tylenol , oxycodone  and Dilaudid  has been initiated - Palliative care consulted for further assistance with pain management. - Patient is actively followed by oncology service and is at the moment actively receiving chemotherapy - They are not interested currently for discussion regarding goals of care and advance care planning. - Continue supportive care and follow clinical response.  3-chronic diastolic heart failure - As mentioned above volume management per dialysis - Follow daily weights, strict intake and output heart healthy/low-sodium diet.  4-GERD - Continue  PPI.  5-hypertension - Continue current antihypertensive regimen - Follow vital signs.  6-hyperlipidemia - Continue statin.  7-history of nonobstructive coronary artery disease -continue tx with ASA and statin - Imdur  and Nebivolol  therapy also resumed.  8-class I obesity -Body mass index is 33.46 kg/m. -Low-calorie diet discussed with patient.    Advance Care Planning:   Code Status: Full Code   Consults: Nephrology service and palliative care.  Family Communication: Husband at bedside.  Severity of Illness: The appropriate patient status for this patient is INPATIENT. Inpatient status is judged to be reasonable and necessary in order to provide the required intensity of service to ensure the patient's safety. The patient's presenting symptoms, physical exam findings, and initial radiographic and laboratory data in the context of their chronic comorbidities is felt to place them at high risk for further clinical deterioration. Furthermore, it is not anticipated that the patient will be medically stable for discharge from the hospital within 2 midnights of admission.   * I certify that at the point of admission it is my clinical judgment that the patient will require inpatient hospital care spanning beyond 2 midnights from the point of admission due to high intensity of service, high risk for further deterioration and high frequency of surveillance required.*  Author: Eric Nunnery, MD 05/23/2024 10:17 AM  For on call review [email or web address].

## 2024-05-23 NOTE — ED Provider Notes (Signed)
 [place] EMERGENCY DEPARTMENT AT [facility] Provider Note   CSN: [id number] Arrival date & time: 05/23/24  9484     Patient presents with: Shortness of Breath   Megan [name] is a 63 y.o. female.   63 year old female with history of end-stage renal disease on dialysis (Monday, Wednesday, Friday) and recently diagnosed recurrent metastatic cancer.  Patient states that she had breast cancer 3 times but over the last few months she has been diagnosed with pretty significant metastatic lung cancer and lymphadenopathy.  States the lymph node anterior chest is hurting worse today but more importantly she is very short of breath.  She states that she had a little bit shortness of breath yesterday and then when she was to bed she woke up with more cough and more short of breath.  She is on her way to dialysis but they decided come here for evaluation first.  No recent fevers.  No sick contacts.  No lower extremity swelling she does have left arm swelling which is where her fistula is but it seems to be improved today.   Shortness of Breath      Prior to Admission medications   Medication Sig Start Date End Date Taking? Authorizing Provider  acetaminophen  (TYLENOL ) 500 MG tablet Take 1,000 mg by mouth every 8 (eight) hours as needed for moderate pain (pain score 4-6).    [provider]  albuterol  (PROVENTIL ) (2.5 MG/3ML) 0.083% nebulizer solution Take 3 mLs (2.5 mg total) by nebulization every 4 (four) hours as needed for wheezing or shortness of breath. 05/15/24 05/15/25  Pearlean Manus, MD  albuterol  (VENTOLIN  HFA) 108 (90 Base) MCG/ACT inhaler Inhale 2 puffs into the lungs every 6 (six) hours as needed for wheezing or shortness of breath. 05/15/24   Pearlean Manus, MD  aluminum-magnesium  hydroxide 200-200 MG/5ML suspension Take 15 mLs by mouth every 4 (four) hours as needed for indigestion (Mix 1:1 with Lidocaine  and swish and spit). 05/22/24   Kandala, Hyndavi, MD   amLODipine  (NORVASC ) 5 MG tablet Take 5 mg by mouth at bedtime. Patient not taking: Reported on 05/20/2024    [provider]  aspirin  EC 81 MG tablet Take 81 mg by mouth daily.    [provider]  atorvastatin  (LIPITOR) 20 MG tablet TAKE 1 TABLET(20 MG) BY MOUTH DAILY 02/11/24   Duanne Butler DASEN, MD  B Complex-C-Zn-Folic Acid  (DIALYVITE  800-ZINC  15) 0.8 MG TABS Take 1 tablet by mouth daily. 06/19/22   [provider]  cinacalcet  (SENSIPAR ) 90 MG tablet Take 90 mg by mouth daily. 03/17/24   [provider]  Darbepoetin Alfa  (ARANESP , ALBUMIN FREE, IJ) Darbepoetin Alfa  (Aranesp ) 07/06/23 07/04/24  [provider]  diphenoxylate -atropine  (LOMOTIL ) 2.5-0.025 MG tablet Take 1 tablet by mouth 4 (four) times daily as needed for diarrhea or loose stools. 05/06/24   Kandala, Hyndavi, MD  fluticasone  (FLONASE ) 50 MCG/ACT nasal spray Place 1 spray into both nostrils daily. Patient not taking: Reported on 05/20/2024    [provider]  gabapentin  (NEURONTIN ) 300 MG capsule TAKE 1 CAPSULE(300 MG) BY MOUTH AT BEDTIME 06/12/23   Duanne Butler DASEN, MD  guaiFENesin  (MUCINEX ) 600 MG 12 hr tablet Take 1 tablet (600 mg total) by mouth 2 (two) times daily. 05/15/24 05/15/25  Pearlean Manus, MD  guaiFENesin -codeine  100-10 MG/5ML syrup Take 10 mLs by mouth 3 (three) times daily as needed for cough. Patient not taking: Reported on 05/20/2024 05/15/24   Pearlean Manus, MD  HYDROcodone  bit-homatropine (HYCODAN) 5-1.5 MG/5ML  syrup Take 5 mLs by mouth every 8 (eight) hours as needed for cough. 05/20/24   Duanne Butler DASEN, MD  isosorbide  mononitrate (IMDUR ) 30 MG 24 hr tablet Take 0.5 tablets (15 mg total) by mouth daily. 03/28/24   Alvan Dorn FALCON, MD  lanthanum  (FOSRENOL ) 1000 MG chewable tablet Chew 2,000-3,000 mg by mouth See admin instructions. Take 3 tablets (3000 mg) by mouth with meals and take 2 tablets (2000 mg) with snacks 10/16/19   [provider]   lidocaine  (XYLOCAINE ) 2 % solution Use as directed 15 mLs in the mouth or throat every 4 (four) hours as needed for mouth pain (Mix 1:1 with Maalox and swish and spit). 05/22/24   Kandala, Hyndavi, MD  lidocaine -prilocaine  (EMLA ) cream APPLY TOPICALLY TO THE AFFECTED AREA 1 TIME Patient not taking: Reported on 05/20/2024 10/01/23   Rogers Hai, MD  loperamide  (IMODIUM  A-D) 2 MG tablet Take 2 mg by mouth 4 (four) times daily as needed for diarrhea or loose stools.    [provider]  meclizine  (ANTIVERT ) 25 MG tablet Take 1 tablet (25 mg total) by mouth 3 (three) times daily as needed for dizziness. Patient not taking: Reported on 05/20/2024 06/12/23   Duanne Butler DASEN, MD  nebivolol  (BYSTOLIC ) 10 MG tablet TAKE 1 TABLET BY MOUTH IN THE MORNING AND AT BEDTIME 02/19/24   Duanne Butler DASEN, MD  Nebulizers (COMPRESSOR/NEBULIZER) MISC 1 Units by Does not apply route daily as needed. 05/15/24   Pearlean Manus, MD  OLANZapine  (ZYPREXA ) 5 MG tablet Take 1 tablet (5 mg total) by mouth at bedtime. 05/06/24   Kandala, Hyndavi, MD  ondansetron  (ZOFRAN -ODT) 4 MG disintegrating tablet Take 1 tablet by mouth every 8 (eight) hours as needed for vomiting or nausea. 04/23/24   [provider]  oxyCODONE  (ROXICODONE ) 5 MG immediate release tablet Take 1 tablet (5 mg total) by mouth every 12 (twelve) hours as needed. 03/19/24 03/19/25  Rogers Hai, MD  pantoprazole  (PROTONIX ) 40 MG tablet Take 1 tablet (40 mg total) by mouth 2 (two) times daily before a meal. 10/08/23   Rourk, Lamar HERO, MD    Allergies: Motrin [ibuprofen]    Review of Systems  Respiratory:  Positive for shortness of breath.     Updated Vital Signs BP (!) 145/78   Pulse (!) 107   Temp 99.5 F (37.5 C)   Resp 14   Ht 5' 1.5 (1.562 m)   Wt 81.6 kg   SpO2 95%   BMI 33.46 kg/m   Physical Exam Vitals and nursing note reviewed.  Constitutional:      Appearance: She is well-developed.  HENT:     Head:  Normocephalic and atraumatic.  Cardiovascular:     Rate and Rhythm: Normal rate and regular rhythm.  Pulmonary:     Effort: No respiratory distress.     Breath sounds: No stridor. Examination of the right-lower field reveals rales. Examination of the left-lower field reveals rales. Rales present. No decreased breath sounds.  Abdominal:     General: There is no distension.  Musculoskeletal:     Cervical back: Normal range of motion.     Right lower leg: No edema.     Left lower leg: No edema.  Skin:    General: Skin is warm and dry.  Neurological:     General: No focal deficit present.     Mental Status: She is alert.     (all labs ordered are listed, but only abnormal results are displayed) Labs  Reviewed  CBC WITH DIFFERENTIAL/PLATELET - Abnormal; Notable for the following components:      Result Value   WBC 19.6 (*)    RBC 2.73 (*)    Hemoglobin 9.7 (*)    HCT 31.2 (*)    MCV 114.3 (*)    MCH 35.5 (*)    RDW 18.6 (*)    nRBC 0.3 (*)    All other components within normal limits  COMPREHENSIVE METABOLIC PANEL WITH GFR - Abnormal; Notable for the following components:   Chloride 95 (*)    Glucose, Bld 127 (*)    BUN 67 (*)    Creatinine, Ser 11.00 (*)    Alkaline Phosphatase 131 (*)    GFR, Estimated 4 (*)    Anion gap 18 (*)    All other components within normal limits  PRO BRAIN NATRIURETIC PEPTIDE  TROPONIN T, HIGH SENSITIVITY    EKG: None  Radiology: DG Chest Portable 1 View Result Date: 05/23/2024 EXAM: 1 VIEW(S) XRAY OF THE CHEST 05/23/2024 05:32:00 AM COMPARISON: Chest CT and radiograph 05/12/2024. CLINICAL HISTORY: 63 year old female with metastatic breast cancer, evaluation for SOB, persistent coughing, and worsening SHOB. FINDINGS: LINES, TUBES AND DEVICES: Stable right chest power port. LUNGS AND PLEURA: Extensive bilateral pulmonary masses in keeping with widespread lung metastases. Lung volumes and ventilation are not significantly changed from last  month. Pneumothorax identified. No pulmonary edema. No pleural effusion. HEART AND MEDIASTINUM: Mediastinal contours are not significantly changed from last month. No acute abnormality of the cardiac silhouette. BONES AND SOFT TISSUES: Stable right chest wall surgical clips. No acute osseous abnormality. UPPER ABDOMEN: Paucity of bowel gas. IMPRESSION: 1. Extensive pulmonary metastatic disease. Stable ventilation from last month. No new cardiopulmonary abnormality. Electronically signed by: Helayne Hurst MD 05/23/2024 06:05 AM EDT RP Workstation: [id number]     .Critical Care  Performed by: Lorette Mayo, MD Authorized by: Lorette Mayo, MD   Critical care provider statement:    Critical care time (minutes):  30   Critical care was necessary to treat or prevent imminent or life-threatening deterioration of the following conditions:  Respiratory failure   Critical care was time spent personally by me on the following activities:  Development of treatment plan with patient or surrogate, discussions with consultants, evaluation of patient's response to treatment, examination of patient, ordering and review of laboratory studies, ordering and review of radiographic studies, ordering and performing treatments and interventions, pulse oximetry, re-evaluation of patient's condition and review of old charts    Medications Ordered in the ED  HYDROmorphone  (DILAUDID ) injection 1 mg (1 mg Intravenous Given 05/23/24 9380)                                    Medical Decision Making Amount and/or Complexity of Data Reviewed Labs: ordered. Radiology: ordered. ECG/medicine tests: ordered.  Risk Prescription drug management.   X-ray viewed interpreted myself with extensive metastatic disease.  She is on 2.5 L nasal cannula at baseline I suspect that at least part of her breathing issue is all the metastatic disease.  She does appear to have a small left pleural effusion but not significant.  Will check  labs and discussed with nephrology [facility] and return to the department for reevaluation see if she needs to be admitted or not.  Attempted to have a discussion with her about the prognosis of her disease however I was shut down  by her husband that they do not want any doom and gloom conversations now.  She is to start chemo on Thursday and prefers to have these talks with her oncologist.  Discussed with Dr. Jerrye and will get dialysis later today.  To be set up by local nephrology.  Will plan for CT scan prior to dialysis to rule out PE. Will likely return to the ED for reeval after dialysis. Care transferred pending same.       Final diagnoses:  None    ED Discharge Orders     None          Shalonda Sachse, Selinda, MD 05/23/24 [phone]

## 2024-05-23 NOTE — Telephone Encounter (Signed)
 Copied from CRM [id number]. Topic: Clinical - Order For Equipment >> May 22, 2024  4:53 PM Delon HERO wrote: Reason for CRM: Patient's husband is calling to follow up on the script sent to [facility] for a wheelchair. Stating that they have not back from Dr. Duanne.  Requesting a follow up call.

## 2024-05-23 NOTE — ED Notes (Signed)
 Edp at bedsde

## 2024-05-23 NOTE — Progress Notes (Signed)
 The patient unable to UF off 2 l d/t Pt had hypotension 85/55 mmhg during HD Tx.  The patient taking off 1.5 L.   05/23/24 1840  Vitals  Temp 98.4 F (36.9 C)  Temp Source Oral  BP 105/60  BP Location Right Arm  BP Method Automatic  Patient Position (if appropriate) Lying  Pulse Rate 88  Resp 20  Oxygen  Therapy  SpO2 97 %  O2 Device Nasal Cannula  O2 Flow Rate (L/min) 2.5 L/min  During Treatment Monitoring  Intra-Hemodialysis Comments Tx completed  Post Treatment  Dialyzer Clearance Heavily streaked  Hemodialysis Intake (mL) 0 mL  Liters Processed 70  Fluid Removed (mL) 1500 mL  Tolerated HD Treatment Yes  Post-Hemodialysis Comments see notes  AVG/AVF Arterial Site Held (minutes) 5 minutes  AVG/AVF Venous Site Held (minutes) 5 minutes  Fistula / Graft Left Forearm Arteriovenous fistula  No Placement Date or Time found.   Orientation: Left  Access Location: Forearm  Access Type: Arteriovenous fistula  Site Condition No complications  Fistula / Graft Assessment Present;Thrill;Bruit  Status Deaccessed  Needle Size 15  Drainage Description None

## 2024-05-23 NOTE — Plan of Care (Signed)

## 2024-05-23 NOTE — Consult Note (Signed)
 Consultation Note Date: 05/23/2024   Patient Name: Megan [name]  DOB: [birth year]  MRN: [id number]  Age / Sex: 63 y.o., female  PCP: Duanne Butler DASEN, MD Referring Physician: Ricky Fines, MD  Reason for Consultation: Establishing goals of care  HPI/Patient Profile: 63 y.o. female  with past medical history of type 2 diabetes, temporal arteritis, nonobstructive CAD, morbid obesity, anemia, hyperlipidemia, GERD, hemorrhoids, hypertension, end-stage renal disease on dialysis, metastatic breast cancer (recurrent), HFpEF admitted on 05/23/2024 with worsening shortness of breath and chest pain.   Workup revealed findings concerning for postobstructive pneumonia in the setting of somewhat immunosuppressed status.  Also noted to have severe pleuritic chest pain/cancer pain.  Imaging obtained in the hospital revealed findings concerning for cancer progression.  Outside specialist notes and prior hospital encounters reviewed.  Patient has had 2 unplanned hospitalizations including current hospitalization and 4 ED visits over the past 6 months.  She was actually recently hospitalized from 05/12/2024 through 05/15/2024 for volume overload, acute respiratory failure with hypoxia secondary to multifocal pneumonia in the setting of metastatic breast cancer with lung mets.  I also see where she is followed by oncology.  Last office visit appears to have been on 05/06/2024.  It was noted at the time that she was having some difficulty tolerating her Capecitabine  due to GI disturbance and therefore the dose was reduced (current treatment:Capecitabinem + pembrolizumab ).  I also see several planned admissions for fistulogram and placement of a Port-A-Cath.  Today, labs and diagnostics reviewed.  CMP reveals elevated renal function consistent with her end-stage renal disease diagnosis.  ALP elevated but otherwise LFTs within normal limits.  BNP significantly elevated at 12,747.   High-sensitivity troponin elevated at 70 repeat flat with a reading of 67.  Procalcitonin is normal.  White blood cell count elevated at 19.6 (on discharge from prior hospitalization on 05/14/2024 white blood cell count was 9.3).  Hemoglobin 9.7 g/dL.  This appears to be at or slightly less than prior hemoglobin levels based off of the available data in epic.  It appears hemoglobin typically ranges in the 9 to 10 g/dL.  MCV and RDW elevated.  Questionable component of iron  deficiency anemia.  EKG independently reviewed.  Findings consistent with sinus tachycardia prolonged QT interval at 486.  Would use caution with QTc prolonging medications.  Chest x-ray independently reviewed.  Numerous masses present throughout bilateral lungs and all lobes.  I do not see any significant consolidation.  Chest CT completed.  No pulmonary emboli noted but marked increase in the size and number of lung nodules and masses throughout both lungs also with some mediastinal and right hilar adenopathy compatible with metastatic adenopathy.  Slight increase in the right liver lobe mass concerning for liver met.  Incidentally noted to have coronary artery and aortic atherosclerosis.  Medication administration record reviewed.  Patient on scheduled fentanyl  at 50 mcgs every 72 hours and olanzapine  5 mg nightly.  Has as needed Dilaudid  for breakthrough pain.  Recent admit.  Has received a total of 1 mg of IV Dilaudid  on 24-hour look back.  Also received 5 mg of oral oxycodone  on 24-hour look back. Vital signs reviewed.  Appear relatively stable.  O2 sats maintained with supplemental O2 at 2.5 L.  PMT has been consulted to assist with pain management.  Of note, I see several times where goals of care conversation was mentioned but patient's husband declined any interest in goals of care.  Today, patient reports her pain is about a 5  out of 10.  She states she does not have any specific pain regimen at home that she is aware of.  However,  in review I see she has oxycodone  immediate release tablets 5 mg every 12 hours as needed and gabapentin  300 mg scheduled at bedtime.  She has some shortness of breath but this is improving.  She is very drowsy.  She reports she is having regular bowel movements.  She is on 2 L of O2 chronically.  Clinical Assessment and Goals of Care:  I have reviewed medical records including EPIC notes, labs and imaging (independently reviewed), medication administration record, vital signs, assessed the patient and then met with patient to discuss diagnosis prognosis, GOC, EOL wishes, disposition and options. Collaborated directly with attending physician, bedside nursing staff, and TOC.   I introduced Palliative Medicine as specialized medical care for people living with serious illness. It focuses on providing relief from the symptoms and stress of a serious illness. The goal is to improve quality of life for both the patient and the family.  We discussed a brief life review of the patient and then focused on their current illness.   I attempted to elicit values and goals of care important to the patient.    Medical History Review and Family/Patient Understanding:   Patient very drowsy, she is aware she has cancer but unable to provide much information due to drowsiness.  I attempt to provide some education regarding her concerns about her Acute illness in the setting of the progression of her chronic condition in the context of her existing comorbidities, and explained how this could potentially impact her future health and prognosis.  However, uncertain how much she retained as she was quite fatigued.  Social History:  Lives at home with her husband  Functional and Nutritional State:  Patient states prior to this hospitalization she was doing okay.  States she was getting around fine and completing her ADLs.  She also reported tolerating dialysis well.  Palliative Symptoms:  Pain, shortness of  breath, nausea/vomiting  Advance care planning Discussion:  Attempted to discuss GOC, advanced directives, and anticipatory care planning.  Unfortunately, patient very drowsy.  Unable to meaningfully participate in the discussion.  She is in agreement with follow-up at a later date for further discussion.  She does share that husband will be decision-maker if she is unable to make decisions for herself.  Discussed the importance of continued conversation with family and the medical providers regarding overall plan of care and treatment options, ensuring decisions are within the context of the patient's values and GOCs.   Questions and concerns were addressed. The patient was encouraged to call with questions or concerns.  PMT will continue to support holistically.  Primary Decision maker and health care surrogate:  PATIENT  Code Status:  Full code    SUMMARY OF RECOMMENDATIONS    Full code Continue current symptom regimen, need more data to help determine appropriate home regimen.  She is quite drowsy today, will need continued monitoring. Continue goals of care discussion as able Palliative medicine team will continue to follow for ongoing goals of care discussion, symptom management, and coordination of care.  Code Status/Advance Care Planning: Full code   Symptom Management:   Continue fentanyl  patch 50 mcg/h change every 72 hours Continue gabapentin  300 mg nightly Continue olanzapine  5 mg nightly Continue Tylenol  650 mg as needed Continue Tussionex as needed Continue hydromorphone  1 mg every 4 hours as needed Continue Magic mouthwash every  8 hours as needed Continue Zofran  4 mg every 6 hours as needed Continue oxycodone  5 mg every 4 hours as needed   Prognosis:  Guarded, poor  Discharge Planning: To Be Determined      Primary Diagnoses: Present on Admission:  Postobstructive pneumonia    Physical Exam Constitutional:      General: She is not in acute  distress.    Appearance: She is not toxic-appearing.     Comments: Currently receiving dialysis  HENT:     Mouth/Throat:     Mouth: Mucous membranes are moist.  Pulmonary:     Effort: Pulmonary effort is normal. No tachypnea.  Skin:    General: Skin is warm and dry.  Neurological:     Mental Status: She is lethargic.     Comments: Patient is able to respond to questioning and answer questions appropriately but is very drowsy and falls back asleep quickly if not being verbally addressed     Vital Signs: BP 122/62 (BP Location: Right Arm)   Pulse (!) 104   Temp 99.1 F (37.3 C) (Oral)   Resp 20   Ht 5' 1.5 (1.562 m)   Wt 81.6 kg   SpO2 95%   BMI 33.46 kg/m  Pain Scale: 0-10   Pain Score: 10-Worst pain ever   SpO2: SpO2: 95 % O2 Device:SpO2: 95 % O2 Flow Rate: .O2 Flow Rate (L/min): 2.5 L/min   Palliative Assessment/Data: 60%     Billing based on MDM: High  Problems Addressed: One acute or chronic illness or injury that poses a threat to life or bodily function  Amount and/or Complexity of Data: Category 1:Review of prior external note(s) from each unique source, Category 2:Independent interpretation of a test performed by another physician/other qualified health care professional (not separately reported), and Category 3:Discussion of management or test interpretation with external physician/other qualified health care professional/appropriate source (not separately reported)  Risks: Parenteral controlled substances   [name], NP  Palliative Medicine Team Team phone # [phone]  Thank you for allowing the Palliative Medicine Team to assist in the care of this patient. Please utilize secure chat with additional questions, if there is no response within 30 minutes please call the above phone number.  Palliative Medicine Team providers are available by phone from 7am to 7pm daily and can be reached through the team cell phone.  Should this patient  require assistance outside of these hours, please call the patient's attending physician.

## 2024-05-23 NOTE — ED Notes (Signed)
 Had a neb this morning before they left but it didn't help Also took cough syrup- no relief of symptoms.

## 2024-05-23 NOTE — ED Provider Notes (Signed)
  Physical Exam  BP 122/62 (BP Location: Right Arm)   Pulse (!) 104   Temp 99.1 F (37.3 C) (Oral)   Resp 20   Ht 5' 1.5 (1.562 m)   Wt 81.6 kg   SpO2 95%   BMI 33.46 kg/m   Physical Exam Vitals and nursing note reviewed.  Constitutional:      General: She is not in acute distress.    Appearance: She is well-developed.  HENT:     Head: Normocephalic and atraumatic.  Eyes:     Conjunctiva/sclera: Conjunctivae normal.  Cardiovascular:     Rate and Rhythm: Normal rate and regular rhythm.     Heart sounds: No murmur heard. Pulmonary:     Effort: Pulmonary effort is normal. No respiratory distress.     Breath sounds: Normal breath sounds.  Abdominal:     Palpations: Abdomen is soft.     Tenderness: There is no abdominal tenderness.  Musculoskeletal:        General: No swelling.     Cervical back: Neck supple.  Skin:    General: Skin is warm and dry.     Capillary Refill: Capillary refill takes less than 2 seconds.  Neurological:     Mental Status: She is alert.  Psychiatric:        Mood and Affect: Mood normal.     Procedures  Procedures  ED Course / MDM    Medical Decision Making Amount and/or Complexity of Data Reviewed Labs: ordered. Radiology: ordered. ECG/medicine tests: ordered.  Risk Prescription drug management. Decision regarding hospitalization.   Presented because of shortness of breath.  On dialysis.  Monday Wednesday Friday.  Diagnosis of metastatic cancer.  On the way to dialysis but is having come here first.  No recent fevers.  No sick contacts.  On 2.5 L nasal cannula baseline.   Patient will have dialysis today here in the p.m.   Went to see the patient this morning.  Complain about worsening respiratory distress with increased work of breathing.  Reviewed patient's labs.  Ongoing leukocytosis.  Recently treated for pneumonia.  Concerns for possible worsening pneumonia type picture given patient's leukocytosis and work of breathing.   Though, this is likely multifactorial given patient's metastatic disease process.  CTA of the chest showed no PE.  Did show marked increase in size and number of lung nodules and masses throughout both lungs.  Given this presentation, patient admitted for further care    Simon Lavonia SAILOR, MD 05/23/24 1217

## 2024-05-23 NOTE — Consult Note (Signed)
 Megan [name] Admit Date: 05/23/2024 05/23/2024 Bernardino KATHEE Gasman Requesting Physician:  Madera MD  Reason for Consult:  ESRD Comanagement HPI:  53F ESRD MWF at [facility] LUE AVF was on way to dialysis this morning but developed progressive dyspnea and pain and presented to the ED.  This is in the context of recently diagnosed recurrent metastatic breast cancer with plan to initiate therapy with oncology next week.  PMH also includes chronic hypoxic respiratory failure with oxygen  dependence, chronic HFpEF, GERD, hypertension.  Last HD was 05/21/2024.  She achieved EDW.  Normotensive at discharge.  She was admitted to the hospital 9/29 through 05/15/2024 and at that time she was treated for multifocal pneumonia.  Discharged on prednisone , Omnicef, azithromycin .  Discharge summary reviewed.  In the ED patient with stable vital signs, no fever, and on 2.5 L O2.  CTA of the chest on 10/10 reviewed independently.  The extent of metastatic disease is noteworthy.  Agree with radiology's interpretation.  No evidence of pneumonia on exam.  CMP reviewed, K3.8, bicarbonate 25, BUN 27.  CBC reviewed with WBC of 19.6 and hemoglobin 9.7.  The patient has ongoing dyspnea and pain.  Outpatient HD orders: [facility] MWF 1st Shift F180, 2K, 2.5Ca, 3h49min, 79.7kg EDW, AVF 15g x2.  Hep 2400 IVB.    ] ROS Balance of 12 systems is negative w/ exceptions as above  PMH  Past Medical History:  Diagnosis Date   (HFpEF) heart failure with preserved ejection fraction (HCC)    a. 01/2019 Echo: EF 55-60%, mild conc LVH. DD.  Torn MV chordae.   Anemia    Atypical chest pain    a. 08/2018 MV: EF 59%, no ischemia; b. 02/2019 Cath: nonobs dzs.   Blood transfusion without reported diagnosis    Breast cancer (HCC) 10/12/2020   Cataract    ESRD (end stage renal disease) on dialysis [facility])    a. HD T, T, S   Essential hypertension, benign    GERD (gastroesophageal reflux disease)    Headache    Hemorrhoids    Mixed  hyperlipidemia    Morbid obesity (HCC)    Non-obstructive CAD (coronary artery disease)    a. 02/2019 CathL LM nl, LAD 27m, LCX nl, RCA 25p, EF 55-65%.   PONV (postoperative nausea and vomiting)    Renal insufficiency    S/P colonoscopy 08/2009   Dr. Rollin: sessile polyp (benign lymphoid), large hemorrhoids, repeat 5-10 years   Temporal arteritis (HCC)    Type 2 diabetes mellitus (HCC)    Wears glasses    PSH  Past Surgical History:  Procedure Laterality Date   A/V SHUNT INTERVENTION Left 04/16/2024   Procedure: A/V SHUNT INTERVENTION;  Surgeon: Pearline Norman RAMAN, MD;  Location: [facility];  Service: Cardiovascular;  Laterality: Left;   ABDOMINAL HYSTERECTOMY     APPENDECTOMY     ARTERY BIOPSY N/A 05/09/2018   Procedure: RIGHT TEMPORAL ARTERY BIOPSY;  Surgeon: Kimble Agent, MD;  Location: [facility] OR;  Service: General;  Laterality: N/A;   BIOPSY  10/04/2023   Procedure: BIOPSY;  Surgeon: Shaaron Lamar HERO, MD;  Location: [facility];  Service: Endoscopy;;   BREAST BIOPSY Right 06/15/2020   Procedure: RIGHT BREAST BIOPSY;  Surgeon: Mavis Anes, MD;  Location: [facility];  Service: General;  Laterality: Right;   CATARACT EXTRACTION W/PHACO Left 02/09/2017   Procedure: CATARACT EXTRACTION PHACO AND INTRAOCULAR LENS PLACEMENT LEFT EYE;  Surgeon: Perley Hamilton, MD;  Location: [facility];  Service: Ophthalmology;  Laterality:  Left;  CDE: 4.89   CATARACT EXTRACTION W/PHACO Right 06/04/2017   Procedure: CATARACT EXTRACTION PHACO AND INTRAOCULAR LENS PLACEMENT (IOC);  Surgeon: Perley Hamilton, MD;  Location: [facility];  Service: Ophthalmology;  Laterality: Right;  CDE: 4.12   CHOLECYSTECTOMY  09/29/2011   Procedure: LAPAROSCOPIC CHOLECYSTECTOMY;  Surgeon: Oneil DELENA Budge, MD;  Location: [facility];  Service: General;  Laterality: N/A;   COLONOSCOPY  08/2009   Dr. Rollin: sessile polyp (benign lymphoid), large hemorrhoids, repeat 5-10 years   COLONOSCOPY N/A 06/12/2016   prominent hemorrhoids   COLONOSCOPY WITH PROPOFOL  N/A  06/16/2021   Procedure: COLONOSCOPY WITH PROPOFOL ;  Surgeon: Shaaron Lamar HERO, MD;  [facility];  Service: Endoscopy;  Laterality: N/A;  9:30am (dialysis pt)   ESOPHAGOGASTRODUODENOSCOPY  09/05/2011   MFM:Dfjoo hiatal hernia; remainder of exam normal. No explanation for patient's abdominal pain with today's examination   ESOPHAGOGASTRODUODENOSCOPY N/A 12/17/2013   Dr. Shaaron: gastric erythema, erosion, mild chronic inflammation on path    ESOPHAGOGASTRODUODENOSCOPY N/A 12/31/2023   Procedure: EGD (ESOPHAGOGASTRODUODENOSCOPY);  Surgeon: Wilhelmenia Aloha Raddle., MD;  Location: [facility];  Service: Gastroenterology;  Laterality: N/A;   ESOPHAGOGASTRODUODENOSCOPY (EGD) WITH PROPOFOL  N/A 06/16/2021   Procedure: ESOPHAGOGASTRODUODENOSCOPY (EGD) WITH PROPOFOL ;  Surgeon: Shaaron Lamar HERO, MD;  [facility];  Service: Endoscopy;  Laterality: N/A;   ESOPHAGOGASTRODUODENOSCOPY (EGD) WITH PROPOFOL  N/A 10/04/2023   Procedure: ESOPHAGOGASTRODUODENOSCOPY (EGD) WITH PROPOFOL ;  Surgeon: Shaaron Lamar HERO, MD;  [facility];  Service: Endoscopy;  Laterality: N/A;  200pm, ok rm 1-2, pt knows to arrive at 6:45   EUS N/A 12/31/2023   Procedure: ULTRASOUND, UPPER GI TRACT, ENDOSCOPIC;  Surgeon: Wilhelmenia Aloha Raddle., MD;  Location: [facility];  Service: Gastroenterology;  Laterality: N/A;   EXCISION OF BREAST BIOPSY Right 10/12/2020   Procedure: EXCISION OF RIGHT BREAST BIOPSY;  Surgeon: Budge Oneil, MD;  Location: [facility];  Service: General;  Laterality: Right;   IR DIALY SHUNT INTRO NEEDLE/INTRACATH INITIAL W/IMG LEFT Left 07/17/2023   LAPAROSCOPIC APPENDECTOMY  09/29/2011   Procedure: APPENDECTOMY LAPAROSCOPIC;  Surgeon: Oneil DELENA Budge, MD;  Location: [facility];  Service: General;;  incidental appendectomy   LEFT HEART CATH AND CORONARY ANGIOGRAPHY N/A 02/28/2019   Procedure: LEFT HEART CATH AND CORONARY ANGIOGRAPHY;  Surgeon: Dann Candyce RAMAN, MD;  Location: [facility] INVASIVE CV LAB;   Service: Cardiovascular;  Laterality: N/A;   MASS EXCISION Right 01/18/2023   Procedure: EXCISION MASS, RIGHT CHEST S/P MASTECTOMY;  Surgeon: Budge Oneil, MD;  Location: [facility];  Service: General;  Laterality: Right;   MASTECTOMY MODIFIED RADICAL Right 02/18/2020   Procedure: MASTECTOMY MODIFIED RADICAL;  Surgeon: Budge Oneil, MD;  Location: [facility];  Service: General;  Laterality: Right;   MASTECTOMY, PARTIAL Right 07/13/2020   Procedure: RIGHT PARTIAL MASTECTOMY;  Surgeon: Budge Oneil, MD;  Location: [facility];  Service: General;  Laterality: Right;   PARTIAL MASTECTOMY WITH NEEDLE LOCALIZATION AND AXILLARY SENTINEL LYMPH NODE BX Right 09/18/2018   Procedure: RIGHT PARTIAL MASTECTOMY AFTER NEEDLE LOCALIZATION, SENTINEL LYMPH NODE BIOPSY RIGHT AXILLA;  Surgeon: Budge Oneil, MD;  Location: [facility];  Service: General;  Laterality: Right;   POLYPECTOMY  06/16/2021   Procedure: POLYPECTOMY;  Surgeon: Shaaron Lamar HERO, MD;  [facility];  Service: Endoscopy;;   PORT-A-CATH REMOVAL Right 11/29/2023   Procedure: REMOVAL PORT-A-CATH;  Surgeon: Budge Oneil, MD;  Location: [facility];  Service: General;  Laterality: Right;  MINOR PROCEDURE ROOM   PORTACATH PLACEMENT Right 06/07/2023   Procedure: INSERTION  PORT-A-CATH, RIGHT  (DIALYSIS ACCESS ON LEFT);  Surgeon: Mavis Anes, MD;  Location: [facility];  Service: General;  Laterality: Right;   PORTACATH PLACEMENT Right 04/03/2024   Procedure: INSERTION, TUNNELED CENTRAL VENOUS DEVICE, WITH PORT;  Surgeon: Mavis Anes, MD;  Location: [facility];  Service: General;  Laterality: Right;   SIMPLE MASTECTOMY WITH AXILLARY SENTINEL NODE BIOPSY Left 06/15/2020   Procedure: LEFT SIMPLE MASTECTOMY;  Surgeon: Mavis Anes, MD;  Location: [facility];  Service: General;  Laterality: Left;   FH  Family History  Problem Relation Age of Onset   Hypertension Mother    Coronary artery disease Mother    Diabetes Mother    Heart attack Father    Hypertension Sister     Coronary artery disease Sister    Hypertension Brother    Colon cancer Brother    Heart attack Maternal Grandmother    Heart attack Maternal Grandfather    Heart attack Paternal Grandmother    Heart attack Paternal Grandfather    Hypertension Son    Heart attack Maternal Aunt    Hypertension Maternal Aunt    Diabetes Maternal Aunt    Heart attack Maternal Uncle    Hypertension Maternal Uncle    Diabetes Maternal Uncle    Heart attack Paternal Aunt    Hypertension Paternal Aunt    Diabetes Paternal Aunt    Heart attack Paternal Uncle    Hypertension Paternal Uncle    Diabetes Paternal Uncle    SH  reports that she has never smoked. She has never been exposed to tobacco smoke. She has never used smokeless tobacco. She reports that she does not drink alcohol and does not use drugs. Allergies  Allergies  Allergen Reactions   Motrin [Ibuprofen] Other (See Comments)    ESRD    Home medications Prior to Admission medications   Medication Sig Start Date End Date Taking? Authorizing Provider  acetaminophen  (TYLENOL ) 500 MG tablet Take 1,000 mg by mouth every 8 (eight) hours as needed for moderate pain (pain score 4-6).    [provider]  albuterol  (PROVENTIL ) (2.5 MG/3ML) 0.083% nebulizer solution Take 3 mLs (2.5 mg total) by nebulization every 4 (four) hours as needed for wheezing or shortness of breath. 05/15/24 05/15/25  Pearlean Manus, MD  albuterol  (VENTOLIN  HFA) 108 (90 Base) MCG/ACT inhaler Inhale 2 puffs into the lungs every 6 (six) hours as needed for wheezing or shortness of breath. 05/15/24   Pearlean Manus, MD  aluminum-magnesium  hydroxide 200-200 MG/5ML suspension Take 15 mLs by mouth every 4 (four) hours as needed for indigestion (Mix 1:1 with Lidocaine  and swish and spit). 05/22/24   Davonna Siad, MD  aspirin  EC 81 MG tablet Take 81 mg by mouth daily.    [provider]  atorvastatin  (LIPITOR) 20 MG tablet TAKE 1 TABLET(20 MG) BY MOUTH DAILY 02/11/24    Duanne Butler DASEN, MD  B Complex-C-Zn-Folic Acid  (DIALYVITE  800-ZINC  15) 0.8 MG TABS Take 1 tablet by mouth daily. 06/19/22   [provider]  cinacalcet  (SENSIPAR ) 90 MG tablet Take 90 mg by mouth daily. 03/17/24   [provider]  Darbepoetin Alfa  (ARANESP , ALBUMIN FREE, IJ) Darbepoetin Alfa  (Aranesp ) 07/06/23 07/04/24  [provider]  diphenoxylate -atropine  (LOMOTIL ) 2.5-0.025 MG tablet Take 1 tablet by mouth 4 (four) times daily as needed for diarrhea or loose stools. 05/06/24   Kandala, Hyndavi, MD  gabapentin  (NEURONTIN ) 300 MG capsule TAKE 1 CAPSULE(300 MG) BY MOUTH AT BEDTIME 06/12/23   Duanne Butler DASEN, MD  isosorbide  mononitrate (IMDUR ) 30 MG 24 hr tablet Take 0.5 tablets (15 mg total) by mouth daily. 03/28/24   Alvan Dorn FALCON, MD  lanthanum  (FOSRENOL ) 1000 MG chewable tablet Chew 2,000-3,000 mg by mouth See admin instructions. Take 3 tablets (3000 mg) by mouth with meals and take 2 tablets (2000 mg) with snacks 10/16/19   [provider]  lidocaine  (XYLOCAINE ) 2 % solution Use as directed 15 mLs in the mouth or throat every 4 (four) hours as needed for mouth pain (Mix 1:1 with Maalox and swish and spit). 05/22/24   Davonna Siad, MD  loperamide  (IMODIUM  A-D) 2 MG tablet Take 2 mg by mouth 4 (four) times daily as needed for diarrhea or loose stools.    [provider]  nebivolol  (BYSTOLIC ) 10 MG tablet TAKE 1 TABLET BY MOUTH IN THE MORNING AND AT BEDTIME 02/19/24   Duanne Butler DASEN, MD  Nebulizers (COMPRESSOR/NEBULIZER) MISC 1 Units by Does not apply route daily as needed. 05/15/24   Pearlean Manus, MD  OLANZapine  (ZYPREXA ) 5 MG tablet Take 1 tablet (5 mg total) by mouth at bedtime. 05/06/24   Kandala, Hyndavi, MD  ondansetron  (ZOFRAN -ODT) 4 MG disintegrating tablet Take 1 tablet by mouth every 8 (eight) hours as needed for vomiting or nausea. 04/23/24   [provider]  oxyCODONE  (ROXICODONE ) 5 MG immediate release tablet Take 1 tablet (5  mg total) by mouth every 12 (twelve) hours as needed. 03/19/24 03/19/25  Rogers Hai, MD  pantoprazole  (PROTONIX ) 40 MG tablet Take 1 tablet (40 mg total) by mouth 2 (two) times daily before a meal. 10/08/23   Rourk, Lamar HERO, MD    Current Medications Scheduled Meds:  aspirin  EC  81 mg Oral Daily   atorvastatin   20 mg Oral Daily   budesonide (PULMICORT) nebulizer solution  0.5 mg Nebulization BID   cinacalcet   90 mg Oral Daily   dextromethorphan-guaiFENesin   1 tablet Oral BID   Dialyvite  800-Zinc  15  1 tablet Oral Daily   fentaNYL   1 patch Transdermal Q72H   gabapentin   300 mg Oral QHS   heparin  injection (subcutaneous)  5,000 Units Subcutaneous Q8H   isosorbide  mononitrate  15 mg Oral Daily   lanthanum   3,000 mg Oral TID WC   methylPREDNISolone (SOLU-MEDROL) injection  40 mg Intravenous Daily   nebivolol   10 mg Oral Daily   OLANZapine   5 mg Oral QHS   pantoprazole   40 mg Oral BID AC   sodium chloride  flush  3 mL Intravenous Q12H   Continuous Infusions:  sodium chloride      PRN Meds:.sodium chloride , acetaminophen  **OR** acetaminophen , HYDROmorphone  (DILAUDID ) injection, ipratropium-albuterol , ondansetron  **OR** ondansetron  (ZOFRAN ) IV, oxyCODONE , sodium chloride  flush  CBC Recent Labs  Lab 05/23/24 0533  WBC 19.6*  NEUTROABS 15.2*  HGB 9.7*  HCT 31.2*  MCV 114.3*  PLT 264   Basic Metabolic Panel Recent Labs  Lab 05/23/24 0533  NA 138  K 3.8  CL 95*  CO2 25  GLUCOSE 127*  BUN 67*  CREATININE 11.00*  CALCIUM  9.1    Physical Exam  Blood pressure 138/69, pulse (!) 103, temperature 99.5 F (37.5 C), resp. rate (!) 21, height 5' 1.5 (1.562 m), weight 81.6 kg, SpO2 93%. GEN: Chronically ill-appearing, in bed ENT: NCAT EYES: EOMI CV: Tachycardic, regular, normal S1 and S2 PULM: Coarse breath sounds throughout ABD: Soft, nontender AVF +B/T  Assessment 52F with recent diagnosis of recurrent widespread metastatic breast cancer presenting with dyspnea,  uncontrolled pain, ESRD.  Recurrent widespread  metastatic breast cancer with uncontrolled pain: Admitted to hospitalist for pain control.  Recently treated for pneumonia.  Though white count is elevated she received corticosteroids.  Afebrile. ESRD MWF using AV fistula at RTC: Dialysis today, orders written Anemia of CKD: Will not use ESA therapy as patient has active malignancy.  Defer to oncology.   Hypertension/volume: EDW appears correct.  UF as able BMD: Usual good control as an outpatient: Continue calcitriol  1.5 mcg 3 times weekly.  On Sensipar  at home.  Continue.  Plan HD today, ordered. Next HD 10/13 on schedule. Will plan to not see the patient over the weekend but please call nephrology on-call for any needs.   Bernardino KATHEE Gasman  [phone] pgr 05/23/2024, 10:28 AM

## 2024-05-23 NOTE — ED Triage Notes (Signed)
 Pov from home. Cc of SOB since last night. Was on the way to dialysis this morning but decided to come here.  Goes tomorrow for chemo, has lung and liver cancer.

## 2024-05-24 DIAGNOSIS — J189 Pneumonia, unspecified organism: Secondary | ICD-10-CM | POA: Diagnosis not present

## 2024-05-24 LAB — CBC
HCT: 30.8 % — ABNORMAL LOW (ref 36.0–46.0)
Hemoglobin: 9.7 g/dL — ABNORMAL LOW (ref 12.0–15.0)
MCH: 35.7 pg — ABNORMAL HIGH (ref 26.0–34.0)
MCHC: 31.5 g/dL (ref 30.0–36.0)
MCV: 113.2 fL — ABNORMAL HIGH (ref 80.0–100.0)
Platelets: 252 K/uL (ref 150–400)
RBC: 2.72 MIL/uL — ABNORMAL LOW (ref 3.87–5.11)
RDW: 18.6 % — ABNORMAL HIGH (ref 11.5–15.5)
WBC: 12.2 K/uL — ABNORMAL HIGH (ref 4.0–10.5)
nRBC: 0.3 % — ABNORMAL HIGH (ref 0.0–0.2)

## 2024-05-24 LAB — BASIC METABOLIC PANEL WITH GFR
Anion gap: 18 — ABNORMAL HIGH (ref 5–15)
BUN: 54 mg/dL — ABNORMAL HIGH (ref 8–23)
CO2: 25 mmol/L (ref 22–32)
Calcium: 8.4 mg/dL — ABNORMAL LOW (ref 8.9–10.3)
Chloride: 94 mmol/L — ABNORMAL LOW (ref 98–111)
Creatinine, Ser: 8.24 mg/dL — ABNORMAL HIGH (ref 0.44–1.00)
GFR, Estimated: 5 mL/min — ABNORMAL LOW (ref >60)
Glucose, Bld: 130 mg/dL — ABNORMAL HIGH (ref 70–99)
Potassium: 4.4 mmol/L (ref 3.5–5.1)
Sodium: 136 mmol/L (ref 135–145)

## 2024-05-24 LAB — HEPATITIS B SURFACE ANTIBODY, QUANTITATIVE: Hep B S AB Quant (Post): 101 m[IU]/mL

## 2024-05-24 MED ORDER — DM-GUAIFENESIN ER 30-600 MG PO TB12
1.0000 | ORAL_TABLET | Freq: Every day | ORAL | Status: DC
  Administered 2024-05-25 – 2024-05-26 (×2): 1 via ORAL
  Filled 2024-05-24 (×2): qty 1

## 2024-05-24 MED ORDER — BENZONATATE 100 MG PO CAPS
100.0000 mg | ORAL_CAPSULE | Freq: Three times a day (TID) | ORAL | Status: DC
  Administered 2024-05-24 – 2024-05-26 (×5): 100 mg via ORAL
  Filled 2024-05-24 (×5): qty 1

## 2024-05-24 NOTE — Plan of Care (Signed)

## 2024-05-24 NOTE — Plan of Care (Signed)
  Problem: Clinical Measurements: Goal: Ability to maintain clinical measurements within normal limits will improve Outcome: Progressing Goal: Will remain free from infection Outcome: Progressing Goal: Respiratory complications will improve Outcome: Progressing Goal: Cardiovascular complication will be avoided Outcome: Progressing   Problem: Pain Managment: Goal: General experience of comfort will improve and/or be controlled Outcome: Progressing

## 2024-05-24 NOTE — Progress Notes (Signed)
 Progress Note   Patient: Megan [name] FMW:[id number] DOB: [birth year] DOA: 05/23/2024     1 DOS: the patient was seen and examined on 05/24/2024   Brief hospital admission narrative course: Megan [name] is a 63 y.o. female with medical history significant of chronic diastolic heart failure, anemia of chronic disease, ESRD, GERD, hypertension, mixed hyperlipidemia, COPD with chronic respiratory failure and hypoxia, class I obesity, history of nonobstructive coronary artery disease and metastatic breast cancer; who presented to the hospital secondary to worsening shortness of breath and associated chest pain. Patient described her pain to be pleuritic in nature and worsened with coughing spells and deep breath.  The pain has been present for the last 4-5 days and worsening.  Patient reported no fever, but expresses having some chills.  There is no nausea, no vomiting, no focal weaknesses, no sick contacts, no diarrhea, no melena, no hematochezia or any other complaints.   Of note, recent hospitalization for pneumonia.  Patient reported finishing discharge antibiotics and steroids therapy prescribed on 05/19/2024.   Patient was on her way to dialysis and unfortunately due to ongoing discomfort decided to come to the hospital for further evaluation and management.   Workup in the ED demonstrating elevated WBCs at 19,000; Respiratory rate increased to 23-25 breaths/min; mild tachycardia and good saturation on chronic supplementation (2-3 L at baseline).  Antibiotics given, cultures taken and CT scan of the chest to rule out pulmonary embolism and given pleuritic discomfort has been ordered.  Nephrology service consulted for dialysis resumption while inpatient.  TR[facility] contacted to place patient in the hospital for further evaluation and management.  Assessment and plan 1-shortness of breath/chronic respiratory failure with hypoxia - Multifactorial in the setting of COPD exacerbation,  bronchiectasis/postobstructive pneumonia, vascular congestion in the setting of interstitial pulmonary edema and acute on chronic diastolic heart failure. - Continue steroids, IV antibiotics, mucolytic's and bronchodilator management - Patient is overall feeling better and breathing easier. - Appreciate assistance and recommendation by nephrology service providing dialysis therapy - Continue to follow with volume stability. - Incentive spirometry/flutter valve-discussed with patient.  2-pleuritic chest pain/cancer pain - Patient with metastatic disease - Excellent response to the use of fentanyl  patch, as needed oxycodone , Dilaudid  and Tylenol . - Reporting mild itching and Atarax will be provided to assist with it. - Appreciate assistance and recommendation by palliative team regarding pain management - Continue to assess the need of Dilaudid /breakthrough oral medications to further adjust maintenance fentanyl  patch dosage.  3-GERD - Continue PPI.  4-chronic diastolic heart failure - Continue volume management with hemodialysis.   -Follow daily weights - Low-sodium diet and strict intake and output discussed with patient.  5-hypertension - Continue current antihypertensive management.  6-hyperlipidemia - Continue statin.  7-history of nonobstructive coronary artery disease - Continue treatment with aspirin  and statin - Patient ongoing chest pain in the setting of metastatic malignancy - No acute ischemic changes appreciated on EKG and evaluation of telemetry while in the ED.  8-class I obesity -Body mass index is 33.07 kg/m.  - Low-calorie diet discussed with patient.  9-thrush - Continue treatment with nystatin and Magic mouthwash.  Subjective:  Currently afebrile; reporting feeling better and with improvement in her overall chest pain discomfort.  Physical Exam: Vitals:   05/24/24 0315 05/24/24 0850 05/24/24 0900 05/24/24 1350  BP:   (!) 108/55 106/66  Pulse:  81 83  85  Resp:  18 18 16   Temp:    98.2 F (36.8 C)  TempSrc:  Oral  SpO2:  96% 97% 94%  Weight: 80.7 kg     Height:       General exam: Alert, awake, oriented x 3; following commands appropriately.  Reports on EEG and can demonstrate positive thrush. Respiratory system: No active wheezing on today's exam; improved air movement bilaterally.  Decreased breath sounds at the bases.  Positive rhonchi. Cardiovascular system:RRR.  No rubs or gallops; no JVD. Gastrointestinal system: Abdomen is nondistended, soft and nontender. No organomegaly or masses felt. Normal bowel sounds heard. Central nervous system: Moving 4 limbs spontaneously.  No focal neurological deficits. Extremities: No cyanosis or clubbing. Skin: No petechiae. Psychiatry: Judgement and insight appear normal. Mood & affect appropriate.    Data Reviewed: CBC: White blood cells 12.2, hemoglobin 9.7 and platelet count 252K Basic metabolic panel: Sodium 136, potassium 4.4, chloride 94, bicarb 25, BUN 54, creatinine 8.24 and GFR 5  Family Communication: Husband at bedside.  Disposition: Status is: Inpatient Remains inpatient appropriate because: IV therapy.  Anticipating discharge back home once medically stable.  Time spent: 50 minutes  Author: Eric Nunnery, MD 05/24/2024 7:03 PM  For on call review [email or web address].

## 2024-05-24 NOTE — TOC Initial Note (Signed)
 Transition of Care [facility]) - Initial/Assessment Note    Patient Details  Name: Megan [name] MRN: [id number] Date of Birth: [birth year]  Transition of Care [facility]) CM/SW Contact:    Sharlyne Stabs, RN Phone Number: 05/24/2024, 2:25 PM  Clinical Narrative:        Patient admitted with post obstructive pneumonia. Discharged home last with with Home oxygen  provided by  [facility] . HD patient, spouse is very supportive and does all the driving for patient.   Palliative consulted for Monday, IPCM following.      Barriers to Discharge: Continued Medical Work up   Patient Goals and CMS Choice Patient states their goals for this hospitalization and ongoing recovery are:: to get better CMS Medicare.gov Compare Post Acute Care list provided to:: Patient Represenative (must comment) Choice offered to / list presented to : Spouse      Expected Discharge Plan and Services       Living arrangements for the past 2 months: Single Family Home Expected Discharge Date: 05/25/24                                    Prior Living Arrangements/Services Living arrangements for the past 2 months: Single Family Home Lives with:: Spouse Patient language and need for interpreter reviewed:: Yes        Need for Family Participation in Patient Care: Yes (Comment) Care giver support system in place?: Yes (comment) Current home services: DME Criminal Activity/Legal Involvement Pertinent to Current Situation/Hospitalization: No - Comment as needed  Activities of Daily Living   ADL Screening (condition at time of admission) Independently performs ADLs?: Yes (appropriate for developmental age) Is the patient deaf or have difficulty hearing?: No Does the patient have difficulty seeing, even when wearing glasses/contacts?: No Does the patient have difficulty concentrating, remembering, or making decisions?: No  Permission Sought/Granted                  Emotional Assessment     Affect  (typically observed): Accepting Orientation: : Oriented to Self, Oriented to Place, Oriented to Situation Alcohol / Substance Use: Not Applicable Psych Involvement: No (comment)  Admission diagnosis:  Postobstructive pneumonia [J18.9] Patient Active Problem List   Diagnosis Date Noted   Postobstructive pneumonia 05/23/2024   Volume overload 05/12/2024   Nausea with vomiting 05/06/2024   Peripheral neuropathy 04/08/2024   Right-sided chest wall pain 04/08/2024   Osteopenia 04/08/2024   Genetic testing 03/26/2024   Mucosal abnormality of stomach 12/31/2023   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 difficile colitis 02/15/2021   Dizziness    Breast mass, right    History of right breast cancer    Lobar pneumonia, unspecified organism 07/03/2020   Pleural effusion, right    S/P left mastectomy 06/15/2020   Allergy, unspecified, initial encounter 03/03/2020   Anaphylactic shock, unspecified, initial encounter 03/03/2020   Breast cancer (HCC) 02/18/2020   History of modified radical mastectomy of right breast 02/18/2020   S/P mastectomy, right 02/18/2020   ESRD (end stage renal disease) on dialysis [facility])    Prolapsed internal hemorrhoids, grade 3 07/31/2019   Fluid overload, unspecified 05/24/2019   Anaphylactic  reaction due to adverse effect of correct drug or medicament properly administered, initial encounter 04/30/2019   Unspecified protein-calorie malnutrition 03/03/2019   Hypertensive heart disease with heart failure (HCC) 03/01/2019   Atherosclerotic heart disease of native coronary artery without angina pectoris 03/01/2019   Chest pain 02/28/2019   Chest pain with high risk for cardiac etiology  02/28/2019   Elevated troponin 02/03/2019   ESRD (end stage renal disease) (HCC) 02/03/2019   Acute diastolic CHF (congestive heart failure) (HCC) 02/03/2019   Hypokalemia 02/03/2019   Acute respiratory failure with hypoxia (HCC) 02/03/2019   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)    Hypocalcemia 09/03/2018   Hypothyroidism, unspecified 09/21/2017   Anemia in chronic kidney disease 04/12/2017   Iron  deficiency anemia, unspecified 04/12/2017   Abnormal results of kidney function studies 04/11/2017   Acidosis 04/11/2017   Anemia, unspecified 04/11/2017   Coagulation defect, unspecified 04/11/2017   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, with stage 5 chronic kidney disease, or end stage renal disease (HCC) 04/11/2017   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 04/11/2017   Other disorders of phosphorus metabolism 04/11/2017   Pain, unspecified 04/11/2017   Proteinuria, unspecified 04/11/2017   Pruritus, unspecified 04/11/2017   Unspecified kidney failure 04/11/2017   Encounter for prophylactic removal of breast 04/04/2017   Rectal bleeding 06/08/2016   Vaginal discharge 12/30/2015   Dyspepsia 11/26/2013   GERD (gastroesophageal reflux disease)    Type 2 diabetes mellitus (HCC)    CKD (chronic kidney disease) stage 3, GFR 30-59 ml/min (HCC)    Diarrhea 08/01/2011   External hemorrhoid 08/01/2011   Hematochezia 07/13/2011   Dyspnea 04/21/2010   Atypical chest pain 04/21/2010   Mixed hyperlipidemia 03/11/2008   OBESITY 03/11/2008   Essential hypertension, benign 03/11/2008   PCP:  Duanne Butler DASEN, MD Pharmacy:   [facility] [phone] - [place], [place] - [address] [place] [address] Phone: [phone] Fax: [phone]  [facility] - [place], [place] - [address] [place]  [address] Phone: [phone] Fax: [phone]     Social Drivers of Health (SDOH) Social History: SDOH Screenings   Food Insecurity: No Food Insecurity (05/23/2024)  Housing: Low Risk  (05/23/2024)  Transportation Needs: No Transportation Needs (05/23/2024)  Utilities: Not At Risk (05/23/2024)  Alcohol Screen: Low Risk  (09/20/2023)  Depression (PHQ2-9): Low Risk  (05/16/2024)  Financial Resource Strain: Low Risk  (09/20/2023)  Physical Activity: Inactive (09/20/2023)  Social Connections: Socially Integrated (05/23/2024)  Stress: No Stress Concern Present (09/20/2023)  Tobacco Use: Low Risk  (05/23/2024)   SDOH Interventions:     Readmission Risk Interventions    05/24/2024    2:23 PM 05/15/2024    3:20 PM  Readmission Risk Prevention Plan  Transportation Screening Complete Complete  Medication Review Oceanographer) Complete Complete  PCP or Specialist appointment within 3-5 days of discharge Not Complete   HRI or Home Care Consult Complete   SW Recovery Care/Counseling Consult Complete Complete  Palliative Care Screening Not Applicable Not Applicable  Skilled Nursing Facility Not Applicable Not Applicable

## 2024-05-25 DIAGNOSIS — J189 Pneumonia, unspecified organism: Secondary | ICD-10-CM | POA: Diagnosis not present

## 2024-05-25 MED ORDER — GUAIFENESIN-DM 100-10 MG/5ML PO SYRP
5.0000 mL | ORAL_SOLUTION | ORAL | Status: DC | PRN
  Administered 2024-05-25 (×2): 5 mL via ORAL
  Filled 2024-05-25 (×3): qty 5

## 2024-05-25 NOTE — Plan of Care (Signed)
  Problem: Education: Goal: Knowledge of General Education information will improve Description: Including pain rating scale, medication(s)/side effects and non-pharmacologic comfort measures Outcome: Progressing   Problem: Health Behavior/Discharge Planning: Goal: Ability to manage health-related needs will improve Outcome: Progressing   Problem: Clinical Measurements: Goal: Will remain free from infection Outcome: Progressing Goal: Respiratory complications will improve Outcome: Progressing Goal: Cardiovascular complication will be avoided Outcome: Progressing   Problem: Activity: Goal: Risk for activity intolerance will decrease Outcome: Progressing   Problem: Nutrition: Goal: Adequate nutrition will be maintained Outcome: Progressing   Problem: Coping: Goal: Level of anxiety will decrease Outcome: Progressing   Problem: Elimination: Goal: Will not experience complications related to bowel motility Outcome: Progressing   Problem: Pain Managment: Goal: General experience of comfort will improve and/or be controlled Outcome: Progressing   Problem: Safety: Goal: Ability to remain free from injury will improve Outcome: Progressing   Problem: Skin Integrity: Goal: Risk for impaired skin integrity will decrease Outcome: Progressing   Problem: Clinical Measurements: Goal: Ability to maintain clinical measurements within normal limits will improve Outcome: Not Progressing

## 2024-05-25 NOTE — Progress Notes (Signed)
 Progress Note   Patient: Megan [name] FMW:[id number] DOB: 02-19-1961 DOA: 05/23/2024     2 DOS: the patient was seen and examined on 05/25/2024   Brief hospital admission narrative course: Megan [name] is a 63 y.o. female with medical history significant of chronic diastolic heart failure, anemia of chronic disease, ESRD, GERD, hypertension, mixed hyperlipidemia, COPD with chronic respiratory failure and hypoxia, class I obesity, history of nonobstructive coronary artery disease and metastatic breast cancer; who presented to the hospital secondary to worsening shortness of breath and associated chest pain. Patient described her pain to be pleuritic in nature and worsened with coughing spells and deep breath.  The pain has been present for the last 4-5 days and worsening.  Patient reported no fever, but expresses having some chills.  There is no nausea, no vomiting, no focal weaknesses, no sick contacts, no diarrhea, no melena, no hematochezia or any other complaints.   Of note, recent hospitalization for pneumonia.  Patient reported finishing discharge antibiotics and steroids therapy prescribed on 05/19/2024.   Patient was on her way to dialysis and unfortunately due to ongoing discomfort decided to come to the hospital for further evaluation and management.   Workup in the ED demonstrating elevated WBCs at 19,000; Respiratory rate increased to 23-25 breaths/min; mild tachycardia and good saturation on chronic supplementation (2-3 L at baseline).  Antibiotics given, cultures taken and CT scan of the chest to rule out pulmonary embolism and given pleuritic discomfort has been ordered.  Nephrology service consulted for dialysis resumption while inpatient.  TRH contacted to place patient in the hospital for further evaluation and management.  Assessment and plan 1-shortness of breath/chronic respiratory failure with hypoxia - Multifactorial in the setting of COPD exacerbation,  bronchiectasis/postobstructive pneumonia, vascular congestion in the setting of interstitial pulmonary edema and acute on chronic diastolic heart failure. - Continue steroids, IV antibiotics, mucolytic's and bronchodilator management - Patient is overall feeling better and breathing easier. - Appreciate assistance and recommendation by nephrology service providing dialysis therapy; next treatment anticipated 05/26/24 - Continue to follow with volume stability. - Incentive spirometry/flutter valve-discussed with patient.  2-pleuritic chest pain/cancer pain - Patient with metastatic disease - Excellent response to the use of fentanyl  patch, as needed oxycodone , Dilaudid  and Tylenol . - Reporting mild itching and Atarax will be provided to assist with it. - Appreciate assistance and recommendation by palliative team regarding pain management - Continue to assess the need of Dilaudid /breakthrough oral medications to further adjust maintenance fentanyl  patch dosage.  3-GERD - Continue PPI.  4-chronic diastolic heart failure - Continue volume management with hemodialysis.   -Follow daily weights - Low-sodium diet and strict intake and output discussed with patient.  5-hypertension - Continue current antihypertensive management.  6-hyperlipidemia - Continue statin.  7-history of nonobstructive coronary artery disease - Continue treatment with aspirin  and statin - Patient ongoing chest pain in the setting of metastatic malignancy - No acute ischemic changes appreciated on EKG and evaluation of telemetry while in the ED.  8-class I obesity -Body mass index is 33.15 kg/m.  - Low-calorie diet discussed with patient.  9-thrush - Continue treatment with nystatin and Magic mouthwash.  Subjective:  No fever, no nausea, no vomiting, no abdominal pain.  Patient reports significant improvement in the control of her chest pain.  Also expressing improvement in her oral thrush and ability to  swallow.  Minimal dry coughing spells reported.Megan [name]  Physical Exam: Vitals:   05/24/24 2004 05/25/24 0441 05/25/24 0826 05/25/24 1415  BP:  135/69  103/63  Pulse:  76  81  Resp:  16  17  Temp:  (!) 97.5 F (36.4 C)  98.2 F (36.8 C)  TempSrc:  Oral    SpO2: 93% (!) 87% 97% 99%  Weight:  80.9 kg    Height:       General exam: Alert, awake, oriented x 3; feeling better and comfortable.  Reporting significant improvement in her pain control.  No nausea, no vomiting. Respiratory system: Improved air movement bilaterally; positive rhonchi, no significant wheezing or crackles on exam. Cardiovascular system:RRR.  No rubs or gallops. Gastrointestinal system: Abdomen is nondistended, soft and nontender. No organomegaly or masses felt. Normal bowel sounds heard. Central nervous system: Alert and oriented. No focal neurological deficits. Extremities: No cyanosis or clubbing; trace edema on her limbs (especially left upper extremity). Skin: No no petechiae. Psychiatry: Judgement and insight appear normal. Mood & affect appropriate.   Latest data Reviewed: CBC: White blood cells 12.2, hemoglobin 9.7 and platelet count 252K Basic metabolic panel: Sodium 136, potassium 4.4, chloride 94, bicarb 25, BUN 54, creatinine 8.24 and GFR 5  Family Communication: Husband at bedside.  Disposition: Status is: Inpatient Remains inpatient appropriate because: IV therapy.  Anticipating discharge back home once medically stable.   Time spent: 50 minutes  Author: Eric Nunnery, MD 05/25/2024 4:13 PM  For on call review [email or web address].

## 2024-05-26 DIAGNOSIS — G893 Neoplasm related pain (acute) (chronic): Secondary | ICD-10-CM | POA: Diagnosis not present

## 2024-05-26 DIAGNOSIS — J189 Pneumonia, unspecified organism: Secondary | ICD-10-CM | POA: Diagnosis not present

## 2024-05-26 DIAGNOSIS — Z79899 Other long term (current) drug therapy: Secondary | ICD-10-CM | POA: Diagnosis not present

## 2024-05-26 DIAGNOSIS — Z515 Encounter for palliative care: Secondary | ICD-10-CM | POA: Diagnosis not present

## 2024-05-26 LAB — HEPATITIS B SURFACE ANTIGEN

## 2024-05-26 LAB — RENAL FUNCTION PANEL
Albumin: 3.5 g/dL (ref 3.5–5.0)
Anion gap: 19 — ABNORMAL HIGH (ref 5–15)
BUN: 100 mg/dL — ABNORMAL HIGH (ref 8–23)
CO2: 24 mmol/L (ref 22–32)
Calcium: 8.5 mg/dL — ABNORMAL LOW (ref 8.9–10.3)
Chloride: 95 mmol/L — ABNORMAL LOW (ref 98–111)
Creatinine, Ser: 12.3 mg/dL — ABNORMAL HIGH (ref 0.44–1.00)
GFR, Estimated: 3 mL/min — ABNORMAL LOW (ref >60)
Glucose, Bld: 129 mg/dL — ABNORMAL HIGH (ref 70–99)
Phosphorus: 6.8 mg/dL — ABNORMAL HIGH (ref 2.5–4.6)
Potassium: 4.5 mmol/L (ref 3.5–5.1)
Sodium: 138 mmol/L (ref 135–145)

## 2024-05-26 LAB — CBC
HCT: 32.2 % — ABNORMAL LOW (ref 36.0–46.0)
Hemoglobin: 9.9 g/dL — ABNORMAL LOW (ref 12.0–15.0)
MCH: 35.4 pg — ABNORMAL HIGH (ref 26.0–34.0)
MCHC: 30.7 g/dL (ref 30.0–36.0)
MCV: 115 fL — ABNORMAL HIGH (ref 80.0–100.0)
Platelets: 267 K/uL (ref 150–400)
RBC: 2.8 MIL/uL — ABNORMAL LOW (ref 3.87–5.11)
RDW: 18.5 % — ABNORMAL HIGH (ref 11.5–15.5)
WBC: 11.9 K/uL — ABNORMAL HIGH (ref 4.0–10.5)
nRBC: 0.3 % — ABNORMAL HIGH (ref 0.0–0.2)

## 2024-05-26 MED ORDER — FENTANYL 50 MCG/HR TD PT72: 1.0000 | MEDICATED_PATCH | TRANSDERMAL | 0 refills | Status: DC

## 2024-05-26 MED ORDER — PREDNISONE 20 MG PO TABS: ORAL_TABLET | ORAL | 0 refills | Status: DC

## 2024-05-26 MED ORDER — HYDROCOD POLI-CHLORPHE POLI ER 10-8 MG/5ML PO SUER: 5.0000 mL | Freq: Two times a day (BID) | ORAL | 0 refills | Status: DC | PRN

## 2024-05-26 MED ORDER — AMOXICILLIN-POT CLAVULANATE 500-125 MG PO TABS
1.0000 | ORAL_TABLET | Freq: Every day | ORAL | 0 refills | Status: AC
Start: 2024-05-27 — End: 2024-06-01

## 2024-05-26 MED ORDER — NYSTATIN 100000 UNIT/ML MT SUSP: 5.0000 mL | Freq: Four times a day (QID) | OROMUCOSAL | 0 refills | Status: DC

## 2024-05-26 MED ORDER — PHENOL 1.4 % MT LIQD: 1.0000 | OROMUCOSAL | 0 refills | Status: DC | PRN

## 2024-05-26 MED ORDER — OXYCODONE HCL 5 MG PO TABS: 5.0000 mg | ORAL_TABLET | ORAL | 0 refills | Status: DC | PRN

## 2024-05-26 MED ORDER — BENZONATATE 100 MG PO CAPS: 100.0000 mg | ORAL_CAPSULE | Freq: Three times a day (TID) | ORAL | 0 refills | Status: DC

## 2024-05-26 NOTE — Progress Notes (Signed)
   05/26/24 1518  Spiritual Encounters  Type of Visit Initial  Care provided to: Patient;Pt and family (Husband and brother)  Referral source Clinical staff  Reason for visit Routine spiritual support  OnCall Visit No  Spiritual Framework  Presenting Themes Values and beliefs;Significant life change;Courage hope and growth  Values/beliefs Megan [name] states that her faith is in [place] and He will get her through  Community/Connection Family;Faith community  Strengths Courage, maturity, spiritual and emotional strength  Goals  Self/Personal Goals Get back to her family  Interventions  Spiritual Care Interventions Made Established relationship of care and support;Compassionate presence;Explored values/beliefs/practices/strengths;Encouragement  Intervention Outcomes  Outcomes Awareness around self/spiritual resourses;Connection to spiritual care  Spiritual Care Plan  Spiritual Care Issues Still Outstanding Chaplain will continue to follow   Reason for Visit: Chaplain responding to referral from clinical team informing me that one [facility] our [facility]'s was hospitalized   Description of Visit: Upon entering the room I found Megan [name] in the bed and her husband and brother were at the bedside for support. I spent a moment introducing myself and Spiritual Care services.  When exploring emotional, spiritual, and relational needs and resources, Megan [name] expressed her solid faith in [place] and shared her belief that He would bring her through.  Megan [name] also expressed the help and encouragement she receives from her brothers and sisters, her children, and her grandchildren.  Before leaving I offered to pray, but Megan [name] politely declined.   Plan of Care: I will plan to follow up with this Pt throughout this hosptialization.   Megan [name], MDiv  Chaplain, [facility] [name] [phone]

## 2024-05-26 NOTE — Plan of Care (Signed)

## 2024-05-26 NOTE — Care Management Important Message (Signed)
 Important Message  Patient Details  Name: Megan [name] MRN: [id number] Date of Birth: Jul 12, 1961   Important Message Given:  Yes - Medicare IM     Nikole Swartzentruber L Verneta Hamidi 05/26/2024, 11:32 AM

## 2024-05-26 NOTE — Progress Notes (Incomplete)
                                                                                                                                                          Daily Progress Note   Patient Name: Megan [name]       Date: 05/26/2024 DOB: September 04, 1960  Age: 63 y.o. MRN#: [id number] Attending Physician: Ricky Fines, MD Primary Care Physician: Duanne Butler DASEN, MD Admit Date: 05/23/2024  Reason for Consultation/Follow-up: {Reason for Consult:23484}  Subjective: ***  Today, ***  Chart review/care coordination:  Completed extensive chart review including EPIC notes, ***. Coordinated care with ****.    Length of Stay: 3   Physical Exam          Vital Signs: BP 118/68 (BP Location: Right Arm)   Pulse 80   Temp 98.2 F (36.8 C)   Resp 18   Ht 5' 1.5 (1.562 m)   Wt 81.9 kg   SpO2 96%   BMI 33.56 kg/m  SpO2: SpO2: 96 % O2 Device: O2 Device: Nasal Cannula O2 Flow Rate: O2 Flow Rate (L/min): 3.5 L/min      Palliative Assessment/Data:   Palliative Care Assessment & Plan   Patient Profile/Assessment:  ***   Recommendations/Plan: ***   Symptom management:  ***  Prognosis:  {Palliative Care Prognosis:23504}    Discharge Planning: {Palliative dispostion:23505}    Detailed review of medical records (labs, imaging, vital signs), medically appropriate exam, discussed with treatment team, counseling and education to patient, family, & staff, documenting clinical information, medication management, coordination of care   Total time: I spent *** minutes in the care of the patient today in the above activities and documenting the encounter.   Billing based on MDM: ***  {Problems Addressed:304933}  {Amount and/or Complexity of Ijuj:695065}  {Risks:304936}         Megan CHRISTELLA Pinal, NP  Palliative Medicine Team Team phone # [phone]  Thank you for allowing the Palliative Medicine Team to assist in the care of this patient. Please utilize secure  chat with additional questions, if there is no response within 30 minutes please call the above phone number.  Palliative Medicine Team providers are available by phone from 7am to 7pm daily and can be reached through the team cell phone.  Should this patient require assistance outside of these hours, please call the patient's attending physician.

## 2024-05-26 NOTE — Plan of Care (Signed)

## 2024-05-26 NOTE — Discharge Summary (Addendum)
 Physician Discharge Summary   Patient: Megan [name] MRN: [id number] DOB: 1960-10-11  Admit date:     05/23/2024  Discharge date: 05/26/24  Discharge Physician: Eric Nunnery   PCP: Duanne Butler DASEN, MD   Recommendations at discharge:  Repeat x-ray in 6-8 weeks to assure resolution of infiltrates Continue further discussion of goals of care and advance care planning Repeat basic metabolic panel to follow electrolytes stability Repeat CBC to follow hemoglobin and WBCs trend/stability  Discharge Diagnoses: Principal Problem:   Postobstructive pneumonia Shortness of breath/chronic respiratory failure with hypoxia Pleuritic chest pain/cancer pain GERD Chronic diastolic heart failure Hypertension Hyperlipidemia History of nonobstructive coronary artery disease Class I obesity Thrush   Brief hospital admission narrative course: Megan [name] is a 63 y.o. female with medical history significant of chronic diastolic heart failure, anemia of chronic disease, ESRD, GERD, hypertension, mixed hyperlipidemia, COPD with chronic respiratory failure and hypoxia, class I obesity, history of nonobstructive coronary artery disease and metastatic breast cancer; who presented to the hospital secondary to worsening shortness of breath and associated chest pain. Patient described her pain to be pleuritic in nature and worsened with coughing spells and deep breath.  The pain has been present for the last 4-5 days and worsening.  Patient reported no fever, but expresses having some chills.  There is no nausea, no vomiting, no focal weaknesses, no sick contacts, no diarrhea, no melena, no hematochezia or any other complaints.   Of note, recent hospitalization for pneumonia.  Patient reported finishing discharge antibiotics and steroids therapy prescribed on 05/19/2024.   Patient was on her way to dialysis and unfortunately due to ongoing discomfort decided to come to the hospital for further  evaluation and management.   Workup in the ED demonstrating elevated WBCs at 19,000; Respiratory rate increased to 23-25 breaths/min; mild tachycardia and good saturation on chronic supplementation (2-3 L at baseline).  Antibiotics given, cultures taken and CT scan of the chest to rule out pulmonary embolism and given pleuritic discomfort has been ordered.  Nephrology service consulted for dialysis resumption while inpatient.  TR[facility] contacted to place patient in the hospital for further evaluation and management.  Assessment and Plan: 1-shortness of breath/chronic respiratory failure with hypoxia - Multifactorial in the setting of COPD exacerbation, bronchiectasis/postobstructive pneumonia, vascular congestion in the setting of interstitial pulmonary edema and acute on chronic diastolic heart failure. - Continue steroids tapering, oral antibiotics, mucolytic's and resumption of bronchodilator management at discharge. - Patient is overall feeling better and breathing easier.  Ready to go home. - Appreciate assistance and recommendation by nephrology service providing dialysis therapy while hospitalized. -next Anticipated treatment will be 05/28/2024. - Continue to follow with volume stability. - Incentive spirometry/flutter valve-discussed with patient.   2-pleuritic chest pain/cancer pain - Patient with metastatic disease - Excellent response to the use of fentanyl  patch, as needed oxycodone  and Tylenol . - Outpatient follow-up with palliative care will be arranged to continue tweaking and adjusting analgesic therapy as an outpatient.   3-GERD - Continue PPI.   4-chronic diastolic heart failure - Continue volume management with hemodialysis.   -Follow daily weights - Low-sodium diet and strict intake and output discussed with patient.   5-hypertension - Continue current antihypertensive management.   6-hyperlipidemia - Continue statin.   7-history of nonobstructive coronary artery  disease - Continue treatment with aspirin  and statin - Patient ongoing chest pain in the setting of metastatic malignancy - No acute ischemic changes appreciated on EKG and evaluation of telemetry while in  the ED.   8-class I obesity -Body mass index is 33.15 kg/m.  - Low-calorie diet discussed with patient.   9-thrush - Continue treatment with nystatin and Magic mouthwash.  Consultants: Nephrology service; palliative care. Procedures performed: see below for x-ray reports. HD treatments.  Disposition: Home Diet recommendation: Heart healthy diet.  DISCHARGE MEDICATION: Allergies as of 05/26/2024       Reactions   Motrin [ibuprofen] Other (See Comments)   ESRD        Medication List     STOP taking these medications    aluminum-magnesium  hydroxide 200-200 MG/5ML suspension       TAKE these medications    acetaminophen  500 MG tablet Commonly known as: TYLENOL  Take 1,000 mg by mouth every 8 (eight) hours as needed for moderate pain (pain score 4-6).   albuterol  (2.5 MG/3ML) 0.083% nebulizer solution Commonly known as: PROVENTIL  Take 3 mLs (2.5 mg total) by nebulization every 4 (four) hours as needed for wheezing or shortness of breath.   albuterol  108 (90 Base) MCG/ACT inhaler Commonly known as: VENTOLIN  HFA Inhale 2 puffs into the lungs every 6 (six) hours as needed for wheezing or shortness of breath.   amoxicillin -clavulanate 500-125 MG tablet Commonly known as: Augmentin  Take 1 tablet by mouth daily for 5 days. Take it after HD on dialysis days. Start taking on: May 27, 2024   ARANESP  (ALBUMIN FREE) IJ Darbepoetin Alfa  (Aranesp )   aspirin  EC 81 MG tablet Take 81 mg by mouth daily.   atorvastatin  20 MG tablet Commonly known as: LIPITOR TAKE 1 TABLET(20 MG) BY MOUTH DAILY   benzonatate  100 MG capsule Commonly known as: TESSALON  Take 1 capsule (100 mg total) by mouth 3 (three) times daily.   chlorpheniramine-HYDROcodone  10-8 MG/5ML Commonly  known as: TUSSIONEX Take 5 mLs by mouth every 12 (twelve) hours as needed (Severe coughing spells.).   cinacalcet  90 MG tablet Commonly known as: SENSIPAR  Take 90 mg by mouth daily.   Compressor/Nebulizer Misc 1 Units by Does not apply route daily as needed.   Dialyvite  800-Zinc  15 0.8 MG Tabs Take 1 tablet by mouth daily.   diphenoxylate -atropine  2.5-0.025 MG tablet Commonly known as: LOMOTIL  Take 1 tablet by mouth 4 (four) times daily as needed for diarrhea or loose stools.   fentaNYL  50 MCG/HR Commonly known as: DURAGESIC  Place 1 patch onto the skin every 3 (three) days. Start taking on: May 29, 2024   gabapentin  300 MG capsule Commonly known as: NEURONTIN  TAKE 1 CAPSULE(300 MG) BY MOUTH AT BEDTIME   isosorbide  mononitrate 30 MG 24 hr tablet Commonly known as: IMDUR  Take 0.5 tablets (15 mg total) by mouth daily.   lanthanum  1000 MG chewable tablet Commonly known as: FOSRENOL  Chew 2,000-3,000 mg by mouth See admin instructions. Take 3 tablets (3000 mg) by mouth with meals and take 2 tablets (2000 mg) with snacks   lidocaine  2 % solution Commonly known as: XYLOCAINE  Use as directed 15 mLs in the mouth or throat every 4 (four) hours as needed for mouth pain (Mix 1:1 with Maalox and swish and spit).   loperamide  2 MG tablet Commonly known as: IMODIUM  A-D Take 2 mg by mouth 4 (four) times daily as needed for diarrhea or loose stools.   nebivolol  10 MG tablet Commonly known as: BYSTOLIC  TAKE 1 TABLET BY MOUTH IN THE MORNING AND AT BEDTIME   nystatin 100000 UNIT/ML suspension Commonly known as: MYCOSTATIN Take 5 mLs (500,000 Units total) by mouth 4 (four) times daily for 7  days.   OLANZapine  5 MG tablet Commonly known as: ZyPREXA  Take 1 tablet (5 mg total) by mouth at bedtime.   ondansetron  4 MG disintegrating tablet Commonly known as: ZOFRAN -ODT Take 1 tablet by mouth every 8 (eight) hours as needed for vomiting or nausea.   oxyCODONE  5 MG immediate release  tablet Commonly known as: Oxy IR/ROXICODONE  Take 1 tablet (5 mg total) by mouth every 4 (four) hours as needed for moderate pain (pain score 4-6) or breakthrough pain. What changed:  when to take this reasons to take this   pantoprazole  40 MG tablet Commonly known as: PROTONIX  Take 1 tablet (40 mg total) by mouth 2 (two) times daily before a meal.   phenol 1.4 % Liqd Commonly known as: CHLORASEPTIC Use as directed 1 spray in the mouth or throat as needed for throat irritation / pain.   predniSONE  20 MG tablet Commonly known as: DELTASONE  Take 2 tablets by mouth daily x 2 days; then 1 tablet by mouth daily x 3 days; then half tablet by mouth daily x 3 days and stop prednisone .        Follow-up Information     Davonna Siad, MD Follow up.   Specialty: Oncology Why: as previously arranged Contact information: [address] [place] [address] [phone]         Duanne Butler DASEN, MD. Schedule an appointment as soon as possible for a visit in 10 day(s).   Specialty: Family Medicine Contact information: [address] [place] [address] [phone]                Discharge Exam: Filed Weights   05/25/24 0441 05/26/24 0629 05/26/24 0953  Weight: 80.9 kg 81.9 kg 81.9 kg   General exam: Alert, awake, oriented x 3; feeling better and comfortable.  Reporting significant improvement in her pain control.  No nausea, no vomiting. Respiratory system: Improved air movement bilaterally; positive rhonchi, no significant wheezing or crackles on exam. Cardiovascular system:RRR.  No rubs or gallops. Gastrointestinal system: Abdomen is nondistended, soft and nontender. No organomegaly or masses felt. Normal bowel sounds heard. Central nervous system: Alert and oriented. No focal neurological deficits. Extremities: No cyanosis or clubbing; trace edema on her limbs (especially left upper extremity). Skin: No no petechiae. Psychiatry: Judgement and insight appear  normal. Mood & affect appropriate.   Condition at discharge: Stable and improved.  The results of significant diagnostics from this hospitalization (including imaging, microbiology, ancillary and laboratory) are listed below for reference.   Imaging Studies: NM PET Image Restag (PS) Skull Base To Thigh Result Date: 05/24/2024 CLINICAL DATA:  Subsequent treatment strategy for breast cancer. EXAM: NUCLEAR MEDICINE PET SKULL BASE TO THIGH TECHNIQUE: 8.8 mCi F-18 FDG was injected intravenously. Full-ring PET imaging was performed from the skull base to thigh after the radiotracer. CT data was obtained and used for attenuation correction and anatomic localization. Fasting blood glucose: 122 mg/dl COMPARISON:  93/80/7974. FINDINGS: Mediastinal blood pool activity: SUV max 2.7 Liver activity: SUV max NA NECK: Low left internal jugular lymph hypermetabolic lymph nodes, SUV max 8.0. Incidental CT findings: None. CHEST: New hypermetabolic nodules and masses throughout the lungs with hypermetabolic mediastinal, hilar, left subpectoral/axillary and extrapleural adenopathy. Incidental CT findings: Right IJ Port-A-Cath terminates in the low SVC. Atherosclerotic calcification of the aorta and coronary arteries. Heart is enlarged. There may be trace right pleural fluid. No pericardial effusion. ABDOMEN/PELVIS: New right hepatic lobe metastasis measures 1.7 cm, SUV max 12.0. New hypermetabolic  retrocaval 8 mm lymph node (202/88), SUV max 7.5. Incidental CT findings: Cholecystectomy. Kidneys are atrophic with small high and low-attenuation lesions, too small to characterize. Numerous bilateral renal stones. SKELETON: Focal intercostal metastasis between the posterior right eleventh and twelfth ribs, SUV max 6.6. No definite osseous hypermetabolism. Incidental CT findings: Minimal degenerative change in the spine. IMPRESSION: 1. Marked progression of metastatic breast cancer as evidenced by widespread pulmonary metastatic  disease, progressive low neck, chest and upper abdominal adenopathy, new liver metastasis and new intercostal metastasis. 2. Bilateral renal stones. Electronically Signed   By: Newell Eke M.D.   On: 05/24/2024 11:21   CT Angio Chest PE W and/or Wo Contrast Result Date: 05/23/2024 CLINICAL DATA:  Shortness of breath since last night. Stage IV breast cancer. EXAM: CT ANGIOGRAPHY CHEST WITH CONTRAST TECHNIQUE: Multidetector CT imaging of the chest was performed using the standard protocol during bolus administration of intravenous contrast. Multiplanar CT image reconstructions and MIPs were obtained to evaluate the vascular anatomy. RADIATION DOSE REDUCTION: This exam was performed according to the departmental dose-optimization program which includes automated exposure control, adjustment of the mA and/or kV according to patient size and/or use of iterative reconstruction technique. CONTRAST:  75mL OMNIPAQUE  IOHEXOL  350 MG/ML SOLN COMPARISON:  Chest radiographs dated 05/23/2024 and 05/12/2024. PET-CT dated 05/22/2024 and chest CT dated 05/12/2024. FINDINGS: Cardiovascular: Normally opacified pulmonary arteries with no pulmonary arterial filling defects seen. Breathing motion artifacts in the lower lung zones. Atheromatous calcifications, including the coronary arteries and aorta. Borderline enlarged heart. No pericardial effusion. Mediastinum/Nodes: Multiple enlarged mediastinal and right hilar nodes without significant change since 05/12/2024. The previously demonstrated 1.8 cm short axis precarinal node has a corresponding short axis diameter today of 1.8 cm on image number 32/5. Unremarkable thyroid  gland, trachea and esophagus. Lungs/Pleura: Marked increase in size and number of lung nodules and masses throughout both lungs. The largest masses in the posterior right upper lobe, measuring 6.8 x 3.8 cm on image number 46/7, previously 5.9 x 3.5 cm on 05/12/2024. Small right pleural effusion with  improvement. Upper Abdomen: Poorly defined right lobe liver mass measuring 1.4 cm on image number 82/5, previously 1.2 cm. Musculoskeletal: Mild thoracic spine degenerative changes. No evidence of bony metastatic disease. Review of the MIP images confirms the above findings. IMPRESSION: 1. No pulmonary emboli. 2. Marked increase in size and number of lung nodules and masses throughout both lungs, compatible with progressive metastatic disease. 3. Stable mediastinal and right hilar adenopathy compatible with metastatic adenopathy. 4. Slight increase in size of a right lobe liver mass suspicious for a liver metastasis. 5. Small right pleural effusion with improvement. 6. Calcific coronary artery and aortic atherosclerosis. Aortic Atherosclerosis (ICD10-I70.0). Electronically Signed   By: Elspeth Bathe M.D.   On: 05/23/2024 09:10   DG Chest Portable 1 View Result Date: 05/23/2024 EXAM: 1 VIEW(S) XRAY OF THE CHEST 05/23/2024 05:32:00 AM COMPARISON: Chest CT and radiograph 05/12/2024. CLINICAL HISTORY: 63 year old female with metastatic breast cancer, evaluation for SOB, persistent coughing, and worsening SHOB. FINDINGS: LINES, TUBES AND DEVICES: Stable right chest power port. LUNGS AND PLEURA: Extensive bilateral pulmonary masses in keeping with widespread lung metastases. Lung volumes and ventilation are not significantly changed from last month. Pneumothorax identified. No pulmonary edema. No pleural effusion. HEART AND MEDIASTINUM: Mediastinal contours are not significantly changed from last month. No acute abnormality of the cardiac silhouette. BONES AND SOFT TISSUES: Stable right chest wall surgical clips. No acute osseous abnormality. UPPER ABDOMEN: Paucity of bowel gas.  IMPRESSION: 1. Extensive pulmonary metastatic disease. Stable ventilation from last month. No new cardiopulmonary abnormality. Electronically signed by: Helayne Hurst MD 05/23/2024 06:05 AM EDT RP Workstation: [id number]   CT CHEST W  CONTRAST Result Date: 05/12/2024 CLINICAL DATA:  Breast cancer, invasive, stage IV, initial workup. * Tracking Code: BO * EXAM: CT CHEST WITH CONTRAST TECHNIQUE: Multidetector CT imaging of the chest was performed during intravenous contrast administration. RADIATION DOSE REDUCTION: This exam was performed according to the departmental dose-optimization program which includes automated exposure control, adjustment of the mA and/or kV according to patient size and/or use of iterative reconstruction technique. CONTRAST:  75mL OMNIPAQUE  IOHEXOL  300 MG/ML  SOLN COMPARISON:  Nuclear medicine PET scan from 01/31/2024 and CT scan chest from 01/24/2024. FINDINGS: Cardiovascular: Normal cardiac size. No pericardial effusion. No aortic aneurysm. There are coronary artery calcifications, in keeping with coronary artery disease. There are also mild peripheral atherosclerotic vascular calcifications of thoracic aorta and its major branches. Mediastinum/Nodes: Visualized thyroid  gland appears grossly unremarkable. No solid / cystic mediastinal masses. The esophagus is nondistended precluding optimal assessment. There multiple new/enlarging mediastinal, bilateral hilar and left axillary lymph nodes with largest in the left axilla and supra carinal region both measuring up to 1.8 x 2.8 cm, compatible with worsening metastases. No significant right axillary lymphadenopathy by size criteria. There are multiple surgical staples in the right axillary region. Lungs/Pleura: Since the prior study there are extensive new solid pulmonary nodules ranging in size from few mm up to 5.9 cm, compatible with worsening metastatic disease. There is small-to-moderate right pleural effusion. No left pleural effusion. No pneumothorax on either side. There also peripheral subsegmental atelectatic changes in the middle lobe. Upper Abdomen: There is a new 1.0 x 1.2 cm ill-defined hypoattenuating lesion in the right hepatic lobe segment 6/7, which is  incompletely characterized on the current exam but concerning for metastasis in the given clinical setting. Remaining visualized upper abdominal viscera within normal limits. Musculoskeletal: A CT Port-a-Cath is seen in the right upper chest wall with the catheter terminating in the lower portion of superior vena cava. Visualized soft tissues of the chest wall are otherwise grossly unremarkable. No suspicious osseous lesions. There are mild multilevel degenerative changes in the visualized spine. IMPRESSION: 1. Since the prior study, there are extensive new solid pulmonary nodules, mediastinal, bilateral hilar and left axillary lymphadenopathy, compatible with worsening metastatic disease. 2. There is also a new 1.0 x 1.2 cm ill-defined hypoattenuating lesion in the right hepatic lobe, which is incompletely characterized on the current exam but concerning for metastasis in the given clinical setting. 3. New small-to-moderate right pleural effusion. 4. Multiple other nonacute observations, as described above. Aortic Atherosclerosis (ICD10-I70.0). Electronically Signed   By: Ree Molt M.D.   On: 05/12/2024 15:25   DG Chest Port 1 View Result Date: 05/12/2024 CLINICAL DATA:  63 year old female with possible sepsis. Metastatic breast cancer. EXAM: PORTABLE CHEST 1 VIEW COMPARISON:  Portable chest 03/14/2024 and earlier. FINDINGS: Portable AP semi upright view at 0550 hours. Extensive now moderate sized and relatively large rounded pulmonary masslike opacities throughout both lungs. Substantial progression compared to 04/03/2024. Associated streaky and confluent right perihilar opacity, left lung base opacity. Probable small pleural effusions. No pneumothorax. Stable right chest power port. Lung volumes and mediastinal contours not significantly changed. Stable visualized osseous structures.  Paucity of bowel gas. IMPRESSION: Portable radiographic appearance suggests severe progression of pulmonary metastatic  disease since 04/03/2024. Some superimposed airspace opacity and probable small pleural effusions. Electronically Signed  By: VEAR Hurst M.D.   On: 05/12/2024 06:18    Microbiology: Results for orders placed or performed during the hospital encounter of 05/23/24  Resp panel by RT-PCR (RSV, Flu A&B, Covid) Anterior Nasal Swab     Status: None   Collection Time: 05/23/24  8:24 AM   Specimen: Anterior Nasal Swab  Result Value Ref Range Status   SARS Coronavirus 2 by RT PCR NEGATIVE NEGATIVE Final    Comment: (NOTE) SARS-CoV-2 target nucleic acids are NOT DETECTED.  The SARS-CoV-2 RNA is generally detectable in upper respiratory specimens during the acute phase of infection. The lowest concentration of SARS-CoV-2 viral copies this assay can detect is 138 copies/mL. A negative result does not preclude SARS-Cov-2 infection and should not be used as the sole basis for treatment or other patient management decisions. A negative result may occur with  improper specimen collection/handling, submission of specimen other than nasopharyngeal swab, presence of viral mutation(s) within the areas targeted by this assay, and inadequate number of viral copies(<138 copies/mL). A negative result must be combined with clinical observations, patient history, and epidemiological information. The expected result is Negative.  Fact Sheet for Patients:  BloggerCourse.com  Fact Sheet for Healthcare Providers:  SeriousBroker.it  This test is no t yet approved or cleared by the [place]  FDA and  has been authorized for detection and/or diagnosis of SARS-CoV-2 by FDA under an Emergency Use Authorization (EUA). This EUA will remain  in effect (meaning this test can be used) for the duration of the COVID-19 declaration under Section 564(b)(1) of the Act, 21 U.S.C.section 360bbb-3(b)(1), unless the authorization is terminated  or revoked sooner.        Influenza A by PCR NEGATIVE NEGATIVE Final   Influenza B by PCR NEGATIVE NEGATIVE Final    Comment: (NOTE) The Xpert Xpress SARS-CoV-2/FLU/RSV plus assay is intended as an aid in the diagnosis of influenza from Nasopharyngeal swab specimens and should not be used as a sole basis for treatment. Nasal washings and aspirates are unacceptable for Xpert Xpress SARS-CoV-2/FLU/RSV testing.  Fact Sheet for Patients: BloggerCourse.com  Fact Sheet for Healthcare Providers: SeriousBroker.it  This test is not yet approved or cleared by the [place]  FDA and has been authorized for detection and/or diagnosis of SARS-CoV-2 by FDA under an Emergency Use Authorization (EUA). This EUA will remain in effect (meaning this test can be used) for the duration of the COVID-19 declaration under Section 564(b)(1) of the Act, 21 U.S.C. section 360bbb-3(b)(1), unless the authorization is terminated or revoked.     Resp Syncytial Virus by PCR NEGATIVE NEGATIVE Final    Comment: (NOTE) Fact Sheet for Patients: BloggerCourse.com  Fact Sheet for Healthcare Providers: SeriousBroker.it  This test is not yet approved or cleared by the [place]  FDA and has been authorized for detection and/or diagnosis of SARS-CoV-2 by FDA under an Emergency Use Authorization (EUA). This EUA will remain in effect (meaning this test can be used) for the duration of the COVID-19 declaration under Section 564(b)(1) of the Act, 21 U.S.C. section 360bbb-3(b)(1), unless the authorization is terminated or revoked.  Performed at [facility], [address] [place], [place] [address]    *Note: Due to a large number of results and/or encounters for the requested time period, some results have not been displayed. A complete set of results can be found in Results Review.    Labs: CBC: Recent Labs  Lab 05/23/24 0533  05/24/24 0353 05/26/24 0926  WBC 19.6* 12.2* 11.9*  NEUTROABS 15.2*  --   --   HGB 9.7* 9.7* 9.9*  HCT 31.2* 30.8* 32.2*  MCV 114.3* 113.2* 115.0*  PLT 264 252 267   Basic Metabolic Panel: Recent Labs  Lab 05/23/24 0533 05/24/24 0353 05/26/24 0926  NA 138 136 138  K 3.8 4.4 4.5  CL 95* 94* 95*  CO2 25 25 24   GLUCOSE 127* 130* 129*  BUN 67* 54* 100*  CREATININE 11.00* 8.24* 12.30*  CALCIUM  9.1 8.4* 8.5*  PHOS  --   --  6.8*   Liver Function Tests: Recent Labs  Lab 05/23/24 0533 05/26/24 0926  AST 32  --   ALT 26  --   ALKPHOS 131*  --   BILITOT 0.3  --   PROT 6.7  --   ALBUMIN 3.6 3.5   CBG: Recent Labs  Lab 05/23/24 1242  GLUCAP 142*    Discharge time spent:  35 minutes.  Signed: Eric Nunnery, MD [facility] 05/26/2024

## 2024-05-26 NOTE — Progress Notes (Signed)
 Admit: 05/23/2024 LOS: 3  53F ESRD MWF HD at [facility] with uncontrolled pain after recent diagnosis of recurrent metastatic breast cancer and treatment of pneumonia  Subjective:  Feels much improved, pain better controlled For dialysis today, then likely discharge Has more open and positive questions about palliative medicine; we discussed is not just for hospice but focuses on symptom management and a more global viewpoint  10/12 0701 - 10/13 0700 In: 483 [P.O.:480; I.V.:3] Out: -   Filed Weights   05/24/24 0315 05/25/24 0441 05/26/24 0629  Weight: 80.7 kg 80.9 kg 81.9 kg    Scheduled Meds:  aspirin  EC  81 mg Oral Daily   atorvastatin   20 mg Oral Daily   benzonatate   100 mg Oral TID   budesonide (PULMICORT) nebulizer solution  0.5 mg Nebulization BID   cinacalcet   90 mg Oral Q breakfast   dextromethorphan-guaiFENesin   1 tablet Oral Daily   fentaNYL   1 patch Transdermal Q72H   gabapentin   300 mg Oral QHS   isosorbide  mononitrate  15 mg Oral Daily   lanthanum   3,000 mg Oral TID WC   methylPREDNISolone (SOLU-MEDROL) injection  40 mg Intravenous Daily   multivitamin  1 tablet Oral Daily   nebivolol   10 mg Oral Daily   nystatin  5 mL Oral QID   OLANZapine   5 mg Oral QHS   pantoprazole   40 mg Oral BID AC   sodium chloride  flush  3 mL Intravenous Q12H   Continuous Infusions:  ceFEPime  (MAXIPIME ) IV 1 g (05/25/24 1034)   PRN Meds:.acetaminophen  **OR** acetaminophen , chlorpheniramine-HYDROcodone , guaiFENesin -dextromethorphan, HYDROmorphone  (DILAUDID ) injection, ipratropium-albuterol , lanthanum , lidocaine  (PF), lidocaine -prilocaine , magic mouthwash w/lidocaine , ondansetron  **OR** ondansetron  (ZOFRAN ) IV, oxyCODONE , pentafluoroprop-tetrafluoroeth, phenol, sodium chloride  flush  Current Labs:  10/11 BMP reviewed: K4.4, BUN 54 10/11 CBC reviewed: WBC improved to 12.2, hemoglobin stable 9.7   Physical Exam:  Blood pressure 107/60, pulse 72, temperature (!) 97.5 F (36.4 C), temperature  source Oral, resp. rate 18, height 5' 1.5 (1.562 m), weight 81.9 kg, SpO2 95%. Comfortable appearing, engaging and appropriate Regular, normal S1 and S2, no rub Clear bilaterally No peripheral edema AV fistula with bruit and thrill  A Recurrent widespread metastatic breast cancer with uncontrolled pain: Admitted to hospitalist for pain control.  Improving.   Recent admission for pneumonia: WBC improving, respiratory symptoms improved. ESRD MWF using AV fistula at RTC: Dialysis today, orders written Anemia of CKD: Will not use ESA therapy as patient has active malignancy.  Defer to oncology.   Hypertension/volume: EDW appears correct.  UF as able BMD: Usual good control as an outpatient: Continue calcitriol  1.5 mcg 3 times weekly.  On Sensipar  at home.  Continue binder.  P Dialysis orders as written Consider palliative engagement in the outpatient setting Okay with discharge today following dialysis Medication Issues; Preferred narcotic agents for pain control are hydromorphone , fentanyl , and methadone. Morphine  should not be used.  Baclofen should be avoided Avoid oral sodium phosphate  and magnesium  citrate based laxatives / bowel preps    Bernardino Gasman MD 05/26/2024, 9:09 AM  Recent Labs  Lab 05/23/24 0533 05/24/24 0353  NA 138 136  K 3.8 4.4  CL 95* 94*  CO2 25 25  GLUCOSE 127* 130*  BUN 67* 54*  CREATININE 11.00* 8.24*  CALCIUM  9.1 8.4*   Recent Labs  Lab 05/23/24 0533 05/24/24 0353  WBC 19.6* 12.2*  NEUTROABS 15.2*  --   HGB 9.7* 9.7*  HCT 31.2* 30.8*  MCV 114.3* 113.2*  PLT 264 252

## 2024-05-26 NOTE — Procedures (Addendum)
 Received patient in bed to unit.  Alert and oriented.  Informed consent signed and in chart.  Cleaned LLA AVF, sprayed access with prescribed numbing spray, cannulated access with 15g needles with no issues. Started tx with no issues.  At 11:45am pt SBP below 105 (103/77). Turned off UF/ stopped pulling fluid. Will reassess in 15 mins. Pt denies any s/s of hypotension. Pt SBP raised to 116/65. UF turned back on reduced from to . Pt SBP dropped to 99/70. UF reduced to . Pt denies any s/s of hypotension.  TX duration: 3 hrs  Patient completed tx.  Transported back to the room  Alert, without acute distress.  Hand-off given to patient's nurse.   Access used: L AVF Access issues: None  Total UF removed: Medication(s) given: See MAR   Megan [name] [name]

## 2024-05-26 NOTE — Progress Notes (Signed)
 D/c orders noted. Contacted out-pt HD clinic, [facility], to inform of pt d/c and anticipated arrival back on Wednesday. No further support needed at this time.   Brenten Janney [facility] [phone]

## 2024-05-26 NOTE — Progress Notes (Signed)
 Pt SBP below 105. Turned off UF/ stopped pulling fluid. Will reassess in 15 mins.

## 2024-05-27 ENCOUNTER — Telehealth: Payer: Self-pay | Admitting: *Deleted

## 2024-05-27 NOTE — Transitions of Care (Post Inpatient/ED Visit) (Signed)
 05/27/2024  Name: Megan [name] MRN: [id number] DOB: April 02, 1961  Today's TOC FU Call Status: Today's TOC FU Call Status:: Successful TOC FU Call Completed TOC FU Call Complete Date: 05/27/24 Patient's Name and Date of Birth confirmed.  Transition Care Management Follow-up Telephone Call Date of Discharge: 05/26/24 [facility] Type of Discharge: Inpatient Admission Primary Inpatient Discharge Diagnosis:: post obstructive pneumonia How have you been since you were released from the hospital?: Better (eating, drinking well, dialysis 3 x week) Any questions or concerns?: No  Items Reviewed: Did you receive and understand the discharge instructions provided?: Yes Medications obtained,verified, and reconciled?: Yes (Medications Reviewed) Any new allergies since your discharge?: No Dietary orders reviewed?: Yes Type of Diet Ordered:: renal,  32 ounces fluid restriction Do you have support at home?: Yes People in Home [RPT]: spouse Name of Support/Comfort Primary Source: Megan [name]  Medications Reviewed Today: Medications Reviewed Today     Reviewed by Aura Mliss LABOR, RN (Registered Nurse) on 05/27/24 at 1352  Med List Status: <None>   Medication Order Taking? Sig Documenting Provider Last Dose Status Informant  acetaminophen  (TYLENOL ) 500 MG tablet [id number] Yes Take 1,000 mg by mouth every 8 (eight) hours as needed for moderate pain (pain score 4-6). [provider]  Active Self, Spouse/Significant Other, Pharmacy Records  albuterol  (PROVENTIL ) (2.5 MG/3ML) 0.083% nebulizer solution [id number] Yes Take 3 mLs (2.5 mg total) by nebulization every 4 (four) hours as needed for wheezing or shortness of breath. Pearlean Manus, MD  Active Self, Spouse/Significant Other, Pharmacy Records  albuterol  (VENTOLIN  HFA) 108 (90 Base) MCG/ACT inhaler [id number] Yes Inhale 2 puffs into the lungs every 6 (six) hours as needed for wheezing or shortness of  breath. Pearlean Manus, MD  Active Self, Spouse/Significant Other, Pharmacy Records  amoxicillin -clavulanate (AUGMENTIN ) 500-125 MG tablet [id number] Yes Take 1 tablet by mouth daily for 5 days. Take it after HD on dialysis days. Ricky Fines, MD  Active   aspirin  EC 81 MG tablet [id number] Yes Take 81 mg by mouth daily. [provider]  Active Self, Spouse/Significant Other, Pharmacy Records  atorvastatin  (LIPITOR) 20 MG tablet [id number] Yes TAKE 1 TABLET(20 MG) BY MOUTH DAILY Pickard, Butler DASEN, MD  Active Self, Spouse/Significant Other, Pharmacy Records  B Complex-C-Zn-Folic Acid  (DIALYVITE  800-ZINC  15) 0.8 MG TABS [id number] Yes Take 1 tablet by mouth daily. [provider]  Active Self, Spouse/Significant Other, Pharmacy Records  benzonatate  (TESSALON ) 100 MG capsule [id number] Yes Take 1 capsule (100 mg total) by mouth 3 (three) times daily. Ricky Fines, MD  Active   chlorpheniramine-HYDROcodone  (TUSSIONEX) 10-8 MG/5ML [id number] Yes Take 5 mLs by mouth every 12 (twelve) hours as needed (Severe coughing spells.). Ricky Fines, MD  Active   cinacalcet  (SENSIPAR ) 90 MG tablet [id number] Yes Take 90 mg by mouth daily. [provider]  Active Self, Spouse/Significant Other, Pharmacy Records           Med Note DARILYN STEPHANE GORMAN Pablo May 12, 2024  1:50 PM) Thursday 05/08/24 started back  Darbepoetin Alfa  (ARANESP , ALBUMIN FREE, IJ) [id number] Yes Darbepoetin Alfa  (Aranesp ) [provider]  Active Self, Spouse/Significant Other, Pharmacy Records           Med Note (COFFELL, JON HERO   Sun Oct 14, 2023 12:16 PM) Unknown last dose, receives at dialysis.  diphenoxylate -atropine  (LOMOTIL ) 2.5-0.025 MG tablet [id number] Yes Take 1 tablet by mouth 4 (four) times daily as needed for diarrhea or loose stools. Davonna Siad, MD  Active  Self, Spouse/Significant Other, Pharmacy Records           Med Note PATTRICIA, MONTIE HERO   Fri May 23, 2024 11:41 AM) prn  fentaNYL   (DURAGESIC ) 50 MCG/HR [id number] Yes Place 1 patch onto the skin every 3 (three) days. Ricky Fines, MD  Active   gabapentin  (NEURONTIN ) 300 MG capsule [id number] Yes TAKE 1 CAPSULE(300 MG) BY MOUTH AT BEDTIME Duanne Butler DASEN, MD  Active Self, Spouse/Significant Other, Pharmacy Records  isosorbide  mononitrate (IMDUR ) 30 MG 24 hr tablet [id number] Yes Take 0.5 tablets (15 mg total) by mouth daily. Alvan Dorn FALCON, MD  Active Self, Spouse/Significant Other, Pharmacy Records  lanthanum  (FOSRENOL ) 1000 MG chewable tablet [id number] Yes Chew 2,000-3,000 mg by mouth See admin instructions. Take 3 tablets (3000 mg) by mouth with meals and take 2 tablets (2000 mg) with snacks [provider]  Active Self, Spouse/Significant Other, Pharmacy Records           Med Note CLAUD, MICHEAL T   Tue Oct 05, 2020 11:02 AM)    lidocaine  (XYLOCAINE ) 2 % solution [id number] Yes Use as directed 15 mLs in the mouth or throat every 4 (four) hours as needed for mouth pain (Mix 1:1 with Maalox and swish and spit). Kandala, Hyndavi, MD  Active Self, Spouse/Significant Other, Pharmacy Records  loperamide  (IMODIUM  A-D) 2 MG tablet [id number] Yes Take 2 mg by mouth 4 (four) times daily as needed for diarrhea or loose stools. [provider]  Active Self, Spouse/Significant Other, Pharmacy Records  nebivolol  (BYSTOLIC ) 10 MG tablet [id number] Yes TAKE 1 TABLET BY MOUTH IN THE MORNING AND AT BEDTIME Duanne Butler DASEN, MD  Active Self, Spouse/Significant Other, Pharmacy Records  Nebulizers (COMPRESSOR/NEBULIZER) MISC [id number] Yes 1 Units by Does not apply route daily as needed. Pearlean Manus, MD  Active Self, Spouse/Significant Other, Pharmacy Records           Med Note PATTRICIA, MONTIE HERO   Fri May 23, 2024 11:44 AM) prn  nystatin (MYCOSTATIN) 100000 UNIT/ML suspension [id number] Yes Take 5 mLs (500,000 Units total) by mouth 4 (four) times daily for 7 days. Ricky Fines, MD  Active   OLANZapine  (ZYPREXA ) 5 MG  tablet [id number] Yes Take 1 tablet (5 mg total) by mouth at bedtime. Davonna Siad, MD  Active Self, Spouse/Significant Other, Pharmacy Records  ondansetron  (ZOFRAN -ODT) 4 MG disintegrating tablet [id number] Yes Take 1 tablet by mouth every 8 (eight) hours as needed for vomiting or nausea. [provider]  Active Self, Spouse/Significant Other, Pharmacy Records           Med Note PATTRICIA, MONTIE HERO   Fri May 23, 2024 11:46 AM) prn  oxyCODONE  (OXY IR/ROXICODONE ) 5 MG immediate release tablet [id number] Yes Take 1 tablet (5 mg total) by mouth every 4 (four) hours as needed for moderate pain (pain score 4-6) or breakthrough pain. Ricky Fines, MD  Active   pantoprazole  (PROTONIX ) 40 MG tablet [id number] Yes Take 1 tablet (40 mg total) by mouth 2 (two) times daily before a meal. Rourk, Lamar HERO, MD  Active Self, Spouse/Significant Other, Pharmacy Records  phenol (CHLORASEPTIC) 1.4 % LIQD [id number] Yes Use as directed 1 spray in the mouth or throat as needed for throat irritation / pain. Ricky Fines, MD  Active   predniSONE  (DELTASONE ) 20 MG tablet [id number] Yes Take 2 tablets by mouth daily x 2 days; then 1 tablet by mouth daily x 3 days; then half tablet by mouth daily x 3 days and stop prednisone . Ricky,  Eric, MD  Active   Med List Note Teretha Renaee SAILOR, RPH-CPP 04/10/24 1401): Xeloda  (capecitabine ) filled at [facility] (Specialty)            Home Care and Equipment/Supplies: Were Home Health Services Ordered?: No Any new equipment or medical supplies ordered?: No  Functional Questionnaire: Do you need assistance with bathing/showering or dressing?: No Do you need assistance with meal preparation?: No Do you need assistance with eating?: No Do you have difficulty maintaining continence: No (dialysis 3 x week) Do you need assistance with getting out of bed/getting out of a chair/moving?: No Do you have difficulty managing or taking your medications?: No  Follow  up appointments reviewed: PCP Follow-up appointment confirmed?: No (pt declined for RN CM to schedule appointment) MD Provider Line Number:[phone] Given: No Specialist Hospital Follow-up appointment confirmed?: Yes Date of Specialist follow-up appointment?: 05/29/24 Follow-Up Specialty Provider:: Dr. Davonna hematology/ oncology and chemotherapy scheduled Do you need transportation to your follow-up appointment?: No Do you understand care options if your condition(s) worsen?: Yes-patient verbalized understanding  SDOH Interventions Today    Flowsheet Row Most Recent Value  SDOH Interventions   Food Insecurity Interventions Intervention Not Indicated  Housing Interventions Intervention Not Indicated  Transportation Interventions Intervention Not Indicated  Utilities Interventions Intervention Not Indicated    Mliss Creed [facility], BSN RN Care Manager/ Transition of Care [place]/ [facility] Population Health [phone]

## 2024-05-28 ENCOUNTER — Other Ambulatory Visit (HOSPITAL_COMMUNITY): Payer: Self-pay

## 2024-05-28 ENCOUNTER — Other Ambulatory Visit: Payer: Self-pay

## 2024-05-28 DIAGNOSIS — D509 Iron deficiency anemia, unspecified: Secondary | ICD-10-CM | POA: Diagnosis not present

## 2024-05-28 DIAGNOSIS — D631 Anemia in chronic kidney disease: Secondary | ICD-10-CM | POA: Diagnosis not present

## 2024-05-28 DIAGNOSIS — Z992 Dependence on renal dialysis: Secondary | ICD-10-CM | POA: Diagnosis not present

## 2024-05-28 DIAGNOSIS — E1129 Type 2 diabetes mellitus with other diabetic kidney complication: Secondary | ICD-10-CM | POA: Diagnosis not present

## 2024-05-28 DIAGNOSIS — N2581 Secondary hyperparathyroidism of renal origin: Secondary | ICD-10-CM | POA: Diagnosis not present

## 2024-05-28 DIAGNOSIS — N186 End stage renal disease: Secondary | ICD-10-CM | POA: Diagnosis not present

## 2024-05-28 NOTE — Progress Notes (Signed)
 OK to proceed with 04/2023 ECHO of 55% .  MD will order new test  V.O. Dr Ivery Molt, PharmD

## 2024-05-28 NOTE — Progress Notes (Signed)
 Pharmacist Chemotherapy Monitoring - Initial Assessment    Anticipated start date: 05/29/24   The following has been reviewed per standard work regarding the patient's treatment regimen: The patient's diagnosis, treatment plan and drug doses, and organ/hematologic function Lab orders and baseline tests specific to treatment regimen  The treatment plan start date, drug sequencing, and pre-medications Prior authorization status  Patient's documented medication list, including drug-drug interaction screen and prescriptions for anti-emetics and supportive care specific to the treatment regimen The drug concentrations, fluid compatibility, administration routes, and timing of the medications to be used The patient's access for treatment and lifetime cumulative dose history, if applicable  The patient's medication allergies and previous infusion related reactions, if applicable   Changes made to treatment plan:  N/A  Follow up needed:  adding baseline ECHO for last was in 04/26/2023 - OK to proceed with that result of 55% and MD will order ECHO to be done soon.   Megan [name], [facility], 05/28/2024  3:46 PM

## 2024-05-29 ENCOUNTER — Inpatient Hospital Stay: Admitting: Oncology

## 2024-05-29 ENCOUNTER — Ambulatory Visit

## 2024-05-29 ENCOUNTER — Other Ambulatory Visit: Payer: Self-pay | Admitting: *Deleted

## 2024-05-29 ENCOUNTER — Inpatient Hospital Stay

## 2024-05-29 ENCOUNTER — Inpatient Hospital Stay (HOSPITAL_BASED_OUTPATIENT_CLINIC_OR_DEPARTMENT_OTHER): Admitting: Oncology

## 2024-05-29 ENCOUNTER — Inpatient Hospital Stay: Attending: Hematology

## 2024-05-29 VITALS — BP 126/73 | HR 87 | Temp 98.0°F | Resp 18

## 2024-05-29 DIAGNOSIS — Z9221 Personal history of antineoplastic chemotherapy: Secondary | ICD-10-CM | POA: Insufficient documentation

## 2024-05-29 DIAGNOSIS — B370 Candidal stomatitis: Secondary | ICD-10-CM | POA: Insufficient documentation

## 2024-05-29 DIAGNOSIS — C7800 Secondary malignant neoplasm of unspecified lung: Secondary | ICD-10-CM | POA: Insufficient documentation

## 2024-05-29 DIAGNOSIS — Z923 Personal history of irradiation: Secondary | ICD-10-CM | POA: Insufficient documentation

## 2024-05-29 DIAGNOSIS — Z7982 Long term (current) use of aspirin: Secondary | ICD-10-CM | POA: Insufficient documentation

## 2024-05-29 DIAGNOSIS — R748 Abnormal levels of other serum enzymes: Secondary | ICD-10-CM | POA: Insufficient documentation

## 2024-05-29 DIAGNOSIS — R197 Diarrhea, unspecified: Secondary | ICD-10-CM | POA: Diagnosis not present

## 2024-05-29 DIAGNOSIS — D61818 Other pancytopenia: Secondary | ICD-10-CM | POA: Diagnosis not present

## 2024-05-29 DIAGNOSIS — Z992 Dependence on renal dialysis: Secondary | ICD-10-CM | POA: Insufficient documentation

## 2024-05-29 DIAGNOSIS — Z79899 Other long term (current) drug therapy: Secondary | ICD-10-CM | POA: Diagnosis not present

## 2024-05-29 DIAGNOSIS — T451X5S Adverse effect of antineoplastic and immunosuppressive drugs, sequela: Secondary | ICD-10-CM

## 2024-05-29 DIAGNOSIS — G629 Polyneuropathy, unspecified: Secondary | ICD-10-CM | POA: Insufficient documentation

## 2024-05-29 DIAGNOSIS — C50911 Malignant neoplasm of unspecified site of right female breast: Secondary | ICD-10-CM | POA: Diagnosis not present

## 2024-05-29 DIAGNOSIS — Z79811 Long term (current) use of aromatase inhibitors: Secondary | ICD-10-CM | POA: Diagnosis not present

## 2024-05-29 DIAGNOSIS — C787 Secondary malignant neoplasm of liver and intrahepatic bile duct: Secondary | ICD-10-CM | POA: Insufficient documentation

## 2024-05-29 DIAGNOSIS — Z170 Estrogen receptor positive status [ER+]: Secondary | ICD-10-CM | POA: Diagnosis not present

## 2024-05-29 DIAGNOSIS — C50211 Malignant neoplasm of upper-inner quadrant of right female breast: Secondary | ICD-10-CM | POA: Diagnosis not present

## 2024-05-29 DIAGNOSIS — Z1732 Human epidermal growth factor receptor 2 negative status: Secondary | ICD-10-CM | POA: Insufficient documentation

## 2024-05-29 DIAGNOSIS — Z7952 Long term (current) use of systemic steroids: Secondary | ICD-10-CM | POA: Diagnosis not present

## 2024-05-29 DIAGNOSIS — Z1722 Progesterone receptor negative status: Secondary | ICD-10-CM | POA: Insufficient documentation

## 2024-05-29 DIAGNOSIS — Z5112 Encounter for antineoplastic immunotherapy: Secondary | ICD-10-CM | POA: Insufficient documentation

## 2024-05-29 DIAGNOSIS — Z9011 Acquired absence of right breast and nipple: Secondary | ICD-10-CM | POA: Insufficient documentation

## 2024-05-29 DIAGNOSIS — N186 End stage renal disease: Secondary | ICD-10-CM | POA: Diagnosis not present

## 2024-05-29 DIAGNOSIS — N200 Calculus of kidney: Secondary | ICD-10-CM | POA: Diagnosis not present

## 2024-05-29 LAB — MAGNESIUM: Magnesium: 2.4 mg/dL (ref 1.7–2.4)

## 2024-05-29 LAB — COMPREHENSIVE METABOLIC PANEL WITH GFR
ALT: 26 U/L (ref 0–44)
AST: 34 U/L (ref 15–41)
Albumin: 3.6 g/dL (ref 3.5–5.0)
Alkaline Phosphatase: 144 U/L — ABNORMAL HIGH (ref 38–126)
Anion gap: 16 — ABNORMAL HIGH (ref 5–15)
BUN: 58 mg/dL — ABNORMAL HIGH (ref 8–23)
CO2: 28 mmol/L (ref 22–32)
Calcium: 9.1 mg/dL (ref 8.9–10.3)
Chloride: 100 mmol/L (ref 98–111)
Creatinine, Ser: 8.07 mg/dL — ABNORMAL HIGH (ref 0.44–1.00)
GFR, Estimated: 5 mL/min — ABNORMAL LOW (ref >60)
Glucose, Bld: 184 mg/dL — ABNORMAL HIGH (ref 70–99)
Potassium: 3.8 mmol/L (ref 3.5–5.1)
Sodium: 143 mmol/L (ref 135–145)
Total Bilirubin: 0.2 mg/dL (ref 0.0–1.2)
Total Protein: 6.6 g/dL (ref 6.5–8.1)

## 2024-05-29 LAB — CBC WITH DIFFERENTIAL/PLATELET
Abs Immature Granulocytes: 0.4 K/uL — ABNORMAL HIGH (ref 0.00–0.07)
Basophils Absolute: 0 K/uL (ref 0.0–0.1)
Basophils Relative: 0 %
Eosinophils Absolute: 0 K/uL (ref 0.0–0.5)
Eosinophils Relative: 0 %
HCT: 28.8 % — ABNORMAL LOW (ref 36.0–46.0)
Hemoglobin: 9.1 g/dL — ABNORMAL LOW (ref 12.0–15.0)
Immature Granulocytes: 2 %
Lymphocytes Relative: 4 %
Lymphs Abs: 0.6 K/uL — ABNORMAL LOW (ref 0.7–4.0)
MCH: 35.8 pg — ABNORMAL HIGH (ref 26.0–34.0)
MCHC: 31.6 g/dL (ref 30.0–36.0)
MCV: 113.4 fL — ABNORMAL HIGH (ref 80.0–100.0)
Monocytes Absolute: 0.9 K/uL (ref 0.1–1.0)
Monocytes Relative: 5 %
Neutro Abs: 14.8 K/uL — ABNORMAL HIGH (ref 1.7–7.7)
Neutrophils Relative %: 89 %
Platelets: 261 K/uL (ref 150–400)
RBC: 2.54 MIL/uL — ABNORMAL LOW (ref 3.87–5.11)
RDW: 18.6 % — ABNORMAL HIGH (ref 11.5–15.5)
WBC: 16.8 K/uL — ABNORMAL HIGH (ref 4.0–10.5)
nRBC: 0 % (ref 0.0–0.2)

## 2024-05-29 LAB — PHOSPHORUS: Phosphorus: 6.1 mg/dL — ABNORMAL HIGH (ref 2.5–4.6)

## 2024-05-29 MED ORDER — PROCHLORPERAZINE MALEATE 10 MG PO TABS: 10.0000 mg | ORAL_TABLET | Freq: Four times a day (QID) | ORAL | 1 refills | Status: DC | PRN

## 2024-05-29 MED ORDER — BENZONATATE 100 MG PO CAPS
100.0000 mg | ORAL_CAPSULE | Freq: Once | ORAL | Status: AC
  Administered 2024-05-29: 100 mg via ORAL
  Filled 2024-05-29: qty 1

## 2024-05-29 MED ORDER — IPRATROPIUM-ALBUTEROL 0.5-2.5 (3) MG/3ML IN SOLN: 3.0000 mL | Freq: Four times a day (QID) | RESPIRATORY_TRACT | Status: DC | PRN

## 2024-05-29 MED ORDER — SODIUM CHLORIDE 0.9 % IV SOLN: INTRAVENOUS | Status: DC

## 2024-05-29 MED ORDER — LIDOCAINE-PRILOCAINE 2.5-2.5 % EX CREA: TOPICAL_CREAM | CUTANEOUS | 3 refills | Status: AC

## 2024-05-29 MED ORDER — SODIUM CHLORIDE 0.9 % IV SOLN
810.0000 mg | Freq: Once | INTRAVENOUS | Status: AC
  Administered 2024-05-29: 810 mg via INTRAVENOUS
  Filled 2024-05-29: qty 81

## 2024-05-29 MED ORDER — ALBUTEROL SULFATE HFA 108 (90 BASE) MCG/ACT IN AERS: 2.0000 | INHALATION_SPRAY | Freq: Once | RESPIRATORY_TRACT | Status: DC | PRN

## 2024-05-29 MED ORDER — FAMOTIDINE IN NACL 20-0.9 MG/50ML-% IV SOLN
20.0000 mg | Freq: Once | INTRAVENOUS | Status: AC
  Administered 2024-05-29: 20 mg via INTRAVENOUS
  Filled 2024-05-29: qty 50

## 2024-05-29 MED ORDER — DIPHENHYDRAMINE HCL 50 MG/ML IJ SOLN
50.0000 mg | Freq: Once | INTRAMUSCULAR | Status: AC
  Administered 2024-05-29: 50 mg via INTRAVENOUS
  Filled 2024-05-29: qty 1

## 2024-05-29 MED ORDER — DEXAMETHASONE SOD PHOSPHATE PF 10 MG/ML IJ SOLN
10.0000 mg | Freq: Once | INTRAMUSCULAR | Status: AC
  Administered 2024-05-29: 10 mg via INTRAVENOUS

## 2024-05-29 MED ORDER — APREPITANT 130 MG/18ML IV EMUL
130.0000 mg | Freq: Once | INTRAVENOUS | Status: AC
  Administered 2024-05-29: 130 mg via INTRAVENOUS
  Filled 2024-05-29: qty 18

## 2024-05-29 MED ORDER — ATROPINE SULFATE 1 MG/ML IV SOLN
0.5000 mg | Freq: Once | INTRAVENOUS | Status: AC | PRN
  Administered 2024-05-29: 0.5 mg via INTRAVENOUS
  Filled 2024-05-29: qty 1

## 2024-05-29 MED ORDER — ACETAMINOPHEN 325 MG PO TABS
650.0000 mg | ORAL_TABLET | Freq: Once | ORAL | Status: AC
  Administered 2024-05-29: 650 mg via ORAL
  Filled 2024-05-29: qty 2

## 2024-05-29 MED ORDER — BENZONATATE 100 MG PO CAPS: 100.0000 mg | ORAL_CAPSULE | Freq: Three times a day (TID) | ORAL | 0 refills | Status: DC

## 2024-05-29 MED ORDER — ALBUTEROL SULFATE (2.5 MG/3ML) 0.083% IN NEBU
2.5000 mg | INHALATION_SOLUTION | Freq: Once | RESPIRATORY_TRACT | Status: AC | PRN
  Administered 2024-05-29: 2.5 mg via RESPIRATORY_TRACT

## 2024-05-29 MED ORDER — LOPERAMIDE HCL 2 MG PO CAPS
ORAL_CAPSULE | ORAL | 3 refills | Status: DC
Start: 2024-05-29 — End: 2024-06-29

## 2024-05-29 MED ORDER — PALONOSETRON HCL INJECTION 0.25 MG/5ML
0.2500 mg | Freq: Once | INTRAVENOUS | Status: AC
  Administered 2024-05-29: 0.25 mg via INTRAVENOUS
  Filled 2024-05-29: qty 5

## 2024-05-29 MED ORDER — METHYLPREDNISOLONE SODIUM SUCC 125 MG IJ SOLR
125.0000 mg | Freq: Once | INTRAMUSCULAR | Status: AC | PRN
  Administered 2024-05-29: 125 mg via INTRAVENOUS

## 2024-05-29 MED ORDER — DEXAMETHASONE 4 MG PO TABS: ORAL_TABLET | ORAL | 1 refills | Status: DC

## 2024-05-29 MED ORDER — ONDANSETRON HCL 8 MG PO TABS
8.0000 mg | ORAL_TABLET | Freq: Three times a day (TID) | ORAL | 1 refills | Status: AC | PRN
Start: 2024-05-29 — End: ?

## 2024-05-29 NOTE — Progress Notes (Signed)
 Per Dr. Davonna, changed the infusion duration of Trodelvy for next treatment (C1D8, scheduled 06/05/24) from 2 hours to 3 hours duration.  Shonique Pelphrey C. Racquelle Hyser, PharmD, MS

## 2024-05-29 NOTE — Progress Notes (Signed)
 Patient presents today for [facility] per providers order.  Vital signs and labs reviewed by MD.  Message received from Dr. Davonna, patient okay for treatment.

## 2024-05-29 NOTE — Progress Notes (Signed)
 Patient Care Team: Duanne Butler DASEN, MD as PCP - General (Family Medicine) Alvan, Dorn FALCON, MD as PCP - Cardiology (Cardiology) Shaaron Lamar HERO, MD (Gastroenterology) Debera Jayson MATSU, MD as Consulting Physician (Cardiology) Rayburn Pac, MD as Consulting Physician (Nephrology)  Clinic Day:  05/29/2024  Referring physician: Duanne Butler DASEN, MD   CHIEF COMPLAINT:  CC: Stage Ib (T1CN0) right breast cancer, ER positive, PR and HER-2 negative    ASSESSMENT & PLAN:   Assessment & Plan: [name]  is a 63 y.o. 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  Diagnosed in 2019. S/p right breast lumpectomy, Oncotype DX score of 47.  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  Also had prophylactic left simple mastectomy. First recurrence: 07/13/2020.  Local recurrence, s/p right breast lumpectomy. Second occurrence: 10/12/2020.  Local recurrence.  S/p right mastectomy and radiation therapy.Patient decided against chemotherapy considering poor tolerance with the first time and bad peripheral neuropathy. Third recurrence: 11/30/2022: Within the right medial pectoralis musculature-local recurrence.  S/p right chest wall mass resection consistent with invasive ductal carcinoma.  Triple negative. S/p RT.  6 cycles of carboplatin /docetaxel  and pembrolizumab . PET scan in 01/2024 showed abnormal lymph nodes in the left axillary region.  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 Triple negative.  Germline mutation analysis: Negative.  [facility] NGS: PD-L1: 10, no other targetable mutations Started on capecitabine  and pembrolizumab .  Patient completed 1 cycle and was admitted to the  hospital for shortness of breath.  CT scan at this time showed progression of disease with lung metastasis and liver metastasis.   -We obtained a PET scan outpatient.  We discussed this results in detail and reviewed together.  There is significant progression of disease in lungs and new hepatic metastasis.  Discussed the results in detail with the patient. -Explained to the patient that at this time it is stage IV disease and the intention of treatment would be palliative and not curative anymore. - Will change the treatment to sacituzumab govitecan every 3 weeks - We discussed common side effects of sacituzumab govitecan including diarrhea and myelosuppression that can lead to neutropenia, which may increase the risk of infection.  Other common side effects include fatigue, nausea, vomiting, hair loss, decreased appetite and anemia -Will continue capecitabine  and pembrolizumab  at this time - Will repeat PET scan after 3 cycles of treatment.  Can consider adding pembrolizumab  later down the line. -Will obtain breast cancer markers with every cycle. - Last echo from 12/2020 with normal LVEF.  Will repeat an echo to get a baseline - Labs reviewed today: CMP: Creatinine: 8.07-baseline, elevated alkaline phosphatase and phosphorus.  Normal LFTs.  CBC: Hemoglobin: 9.1, WBC: 16.8, platelets: 261 - Okay to proceed with treatment today.  Return to clinic in 3 weeks with labs for follow-up and next cycle of infusion  Cough due to lung metastases Persistent cough likely from lung metastases. No antibiotics needed at this time.  - Continue Tessalon  perles for cough management. - Provided refills for Tessalon  perles as needed. - Continue oxygen  support  Peripheral neuropathy Existing peripheral neuropathy may worsen with new treatment.    -Recommended to report any symptom changes. - Monitor for worsening neuropathy symptoms. - Instruct her to report  any increase in numbness or  tingling.  Anemia Anemia with low hemoglobin.  Likely secondary to myelosuppression.  Anemia panel with elevated ferritin last time  - Monitor hemoglobin levels closely.  End-stage renal disease on dialysis End-stage renal disease managed with dialysis.   - Continue regular dialysis sessions. - Monitor kidney function closely.  Diarrhea Intermittent diarrhea, previously linked to capecitabine . Occasional Imodium  use reported.  - Use Imodium  as needed for diarrhea management.  Can exacerbate with Sacituzumab  govitecan  Goals of Care Cancer not curable; focus on disease control and life prolongation. She understands treatment is indefinite to manage cancer.  - Discuss goals of care and treatment expectations with her.   The patient understands the plans discussed today and is in agreement with them.  She knows to contact our office if she develops concerns prior to her next appointment.  The total time spent in the appointment was 40 minutes for the encounter  with patient, including review of chart and various tests results, discussions about plan of care and coordination of care plan   Mickiel Dry, MD  [facility] [place] - A DEPT OF [name] [address] [place] [address] Dept: [phone] Dept Fax: [phone]   Orders Placed This Encounter  Procedures   ECHOCARDIOGRAM COMPLETE    Standing Status:   Future    Expected Date:   06/05/2024    Expiration Date:   05/29/2025    Where should this test be performed:   [facility]    Perflutren DEFINITY (image enhancing agent) should be administered unless hypersensitivity or allergy exist:   Administer Perflutren    Reason for exam-Echo:   Chemo  Z09     ONCOLOGY HISTORY:   I have reviewed her chart and materials related to her cancer extensively and collaborated history with the patient. Summary of oncologic history is as follows:   Diagnosis: Stage  Ib (T1CN0) right breast cancer, ER positive, PR and HER-2 negative   -02/18/2018: Screening Mammogram: possible mass in the right breast. -02/26/2018: Diagnostic Mammogram and US  of right breast: Several small 4-5 mm cysts, with a bilobed cyst at the 1 o'clock position 6 cm from nipple measuring 0.5 x 0.3 x 0.4 cm. Irregular shadowing mass in the right breast at the 12:30 position 6 cm from nipple measuring 0.7 x 0.8 x 0.6 cm. -03/05/2018: Right breast mass biopsy.  Pathology: Benign breast tissue with areas of dense hyaline fibrosis. No evidence of malignancy.  -08/20/2018: Diagnostic Right breast Mammogram: Suspicious mass in the 1 o'clock location of the RIGHT breast.  -08/27/2018: Right breast mass biopsy.  Pathology: Invasive ductal carcinoma. Based on the biopsy, the carcinoma appears Nottingham grade 3 of 3 and measures 0.5 cm in greatest linear extent. The tumor cells are ER positive (50%), PR negative (0%), and Her2 negative (2+). Ki67 60%. -09/18/2018: Right lumpectomy and sentinel lymph node biopsy.  Pathology: Invasive ductal carcinoma, grade 3, spanning 1.5 cm. High grade ductal carcinoma in situ with necrosis. Invasive carcinoma comes to within 0.1-0.2 cm of the anterior margin focally. Three of three lymph nodes negative for metastatic carcinoma.  Oncotype DX: 47 -10/30/2018 to 01/01/2019: 4 cycles of adjuvant AC -01/2019- 10/2023: Anastrozole  -09/02/2019: Mammogram: 8 mm group of indeterminate calcifications in the 3 o'clock position of the right breast. -09/12/2019: Right breast biopsy.  Pathology: DCIS, high nuclear grade with central necrosis and calcifications.  -02/18/2020: Right partial mastectomy. Pathology: Ductal carcinoma  in situ, high-grade, with central necrosis and calcifications. No evidence of invasive carcinoma. Resection margins are negative for DCIS. Eleven lymph nodes, negative for carcinoma (0/11). -06/15/2020: Prophylactic Left simple mastectomy with right  breast bisopy.  Pathology of left breast: Benign breast parenchyma with mild fibrocystic change.Negative for carcinoma. Pathology of right breast biopsy: Microscopic focus of invasive ductal carcinoma in a background of extensive high-grade ductal carcinoma in situ with necrosis and rare calcifications measuring 0.1 cm in greatest linear dimension.  -07/13/2020: Right breast lumpectomy.  Pathology: Multifocal invasive ductal carcinoma, grade 3, largest spanning 1.2 cm. Extensive high grade ductal carcinoma in situ with necrosis. Invasive carcinoma is 1-2 mm from the medial margin focally and 2 mm from the lateral margin focally.  ER/PR/HER-2 is negative.  Ki-67 is 20%.  -09/14/2020: PET: Areas of nodularity about the RIGHT breast in this patient who is undergone previous lumpectomy x2 and RIGHT mastectomy. Most suspicious area is associated with discrete nodule in the fat of the inferior chest wall within the subcutaneous tissues. Scattered small areas along the pectoralis musculature tracking laterally largest on image 56 of series 4 also associated with low level uptake. Small pulmonary nodules without interval change. -09/28/2020: US  of right chest wall: Mass-like abnormality in the upper inner aspect of the right anterior chest, designated at 1 o'clock, 7 cm from the central aspect of the mastectomy scar. This measures 4.1 cm in greatest dimension. 1.7 cm hypoechoic mass the central anterior right chest deep to the mid aspect of the mastectomy scar. Possible borderline enlarged residual right axillary lymph node.  -10/12/2020: Right breast biopsy.  Pathology: Invasive and in situ ductal carcinoma, 1.3 x 1.1 x 0.8 cm. Invasive carcinoma focally 0.1 cm from superior margin. DCIS less than 0.1 cm from superior margin.  -10/28/2020: [facility] results: Negative. -12/2020: Right mastectomy followed by radiation therapy completed. Patient decided against chemotherapy considering poor tolerance with the first  time and bad peripheral neuropathy. -11/30/2022: PET: Recurrent/metastatic disease within the right medial pectoralis musculature in this patient who is status post bilateral mastectomy. A right suprarenal 8 mm nodule is likely arising from the adrenal gland. -12/19/2022: Right chest wall mass biopsy.  Pathology: Metastatic adenocarcinoma. Tumor cells are ER positive (40%), PR negative (0%), and Her2 negative (0). Ki-67 30%. -01/18/2023: Right chest wall mass resection with right breast biopsy. -Pathology of right chest wall: Invasive ductal carcinoma (clinically recurrent), 1.7 cm, grade 3. Ductal carcinoma in situ, high-grade with necrosis. Carcinoma involves the inked inferior margin. Carcinoma focally invades into atrophic skeletal muscle. -Pathology of right breast biopsy: Grade 3 Invasive ductal carcinoma. Lymphovascular invasion not identified. Tumor cells ER negative (0%), PR negative (0%), and Her negative (0). Ki-67 35%. -04/18/2023: Patient's case discussed at multidisciplinary clinic at [facility]. Surgical oncology recommended radiation therapy. Patient also evaluated by medical oncology who recommended systemic therapy with 6 cycles of docetaxel , carboplatin  and pembrolizumab .  -05/07/2023 to 05/25/2023: XRT -06/14/2023 to 09/27/2023:  6 cycles of carboplatin , docetaxel  and pembrolizumab   -01/31/2024: PET: multiple abnormal lymph nodes identified in the left axillary region and chest wall which are hypermetabolic there is also a node in the supraclavicular region on the left. These are concerning for worsening metastatic disease. -02/26/2024: Left axillary lymph node biopsy.  -Pathology: Metastatic carcinoma involving a lymph node, compatible with patient's clinical history of primary breast carcinoma. All prognostic markers negative (0). Ki-67 20%.  -[facility] NGS: PDL1: Positive, CPS: 10, ER/PR/HER2/NEU: Triple negative, HER2: Negative, MSI-stable, BRAF, NTRK 1/2/3, RET: Negative,  genomic LOH: Low, TMB: Low 37mut/Mb, ESR 1: Not detected, PALB2: Not detected PIK3CA: Not detected -03/03/2024: CT AP w contrast: New right lower lobe nodules worrisome for metastatic disease. -03/12/2024: BRCA1/2 analysis: Germline mutation analysis: NTHL1- Variant of unknown significance -03/26/2024: Patient was seen by Dr. [name] coons oncologist] at [facility], who recommended systemic treatment with pembrolizumab  and significantly dose-reduced capecitabine  (500 mg BID). If disease progression occurs, next line of treatment may be dose-adjusted sacituzumab govitecan + pembrolizumab  (Considering poor data for either in patients with ESRD  -04/08/2024: CEA 15-3: 67, CA 27-29: 98.7 -04/17/2024-05/08/2024:  Capecitabine (dose reduced ) + pembrolizumab  -05/22/2024: PET Scan: Marked progression of metastatic breast cancer as evidenced by widespread pulmonary metastatic disease, progressive low neck, chest and upper abdominal adenopathy, new liver metastasis and new intercostal metastasis.  Current Treatment:  Capecitabine (dose reduced ) + pembrolizumab   INTERVAL HISTORY:   Discussed the use of AI scribe software for clinical note transcription with the patient, who gave verbal consent to proceed.  History of Present Illness Megan [name] is a 63 year old female with metastatic breast cancer who presents for follow-up today.  She has a persistent and severe cough, described as 'coughing like crazy,' which worsens with movement. She is on supplemental oxygen  at 2 liters per minute, increasing to 2.5 liters as needed. Tessalon  perles are used for cough management, which she has at home.  A recent PET scan was performed.  We discussed the results in detail.  She does have clear progression of disease and would need change in treatment which we previously discussed in the hospital while inpatient as well.  She has a history of nausea, vomiting, and diarrhea associated with previous  chemotherapy, specifically capecitabine , but currently reports no nausea or vomiting since her hospital discharge. She occasionally uses Imodium  for diarrhea, though it was not needed on the morning of the visit. She is undergoing dialysis. She also has neuropathy which is stable.   I have reviewed the past medical history, past surgical history, social history and family history with the patient and they are unchanged from previous note.  ALLERGIES:  is allergic to motrin [ibuprofen].  MEDICATIONS:  Current Outpatient Medications  Medication Sig Dispense Refill   guaiFENesin -codeine  100-10 MG/5ML syrup Take 10 mLs by mouth.     acetaminophen  (TYLENOL ) 500 MG tablet Take 1,000 mg by mouth every 8 (eight) hours as needed for moderate pain (pain score 4-6).     albuterol  (PROVENTIL ) (2.5 MG/3ML) 0.083% nebulizer solution Take 3 mLs (2.5 mg total) by nebulization every 4 (four) hours as needed for wheezing or shortness of breath. 75 mL 2   albuterol  (VENTOLIN  HFA) 108 (90 Base) MCG/ACT inhaler Inhale 2 puffs into the lungs every 6 (six) hours as needed for wheezing or shortness of breath. 8 g 2   amoxicillin -clavulanate (AUGMENTIN ) 500-125 MG tablet Take 1 tablet by mouth daily for 5 days. Take it after HD on dialysis days. 5 tablet 0   aspirin  EC 81 MG tablet Take 81 mg by mouth daily.     atorvastatin  (LIPITOR) 20 MG tablet TAKE 1 TABLET(20 MG) BY MOUTH DAILY 90 tablet 0   B Complex-C-Zn-Folic Acid  (DIALYVITE  800-ZINC  15) 0.8 MG TABS Take 1 tablet by mouth daily.     benzonatate  (TESSALON ) 100 MG capsule Take 1 capsule (100 mg total) by mouth 3 (three) times daily. 90 capsule 0   chlorpheniramine-HYDROcodone  (TUSSIONEX) 10-8 MG/5ML Take 5 mLs by mouth every 12 (twelve) hours as needed (Severe coughing  spells.). 230 mL 0   cinacalcet  (SENSIPAR ) 90 MG tablet Take 90 mg by mouth daily.     Darbepoetin Alfa  (ARANESP , ALBUMIN FREE, IJ) Darbepoetin Alfa  (Aranesp )     dexamethasone  (DECADRON ) 4 MG  tablet Take 2 tablets (8 mg) daily for 3 days. Start the day after chemotherapy. 30 tablet 1   diphenoxylate -atropine  (LOMOTIL ) 2.5-0.025 MG tablet Take 1 tablet by mouth 4 (four) times daily as needed for diarrhea or loose stools. 120 tablet 0   fentaNYL  (DURAGESIC ) 50 MCG/HR Place 1 patch onto the skin every 3 (three) days. 7 patch 0   gabapentin  (NEURONTIN ) 300 MG capsule TAKE 1 CAPSULE(300 MG) BY MOUTH AT BEDTIME 90 capsule 3   isosorbide  mononitrate (IMDUR ) 30 MG 24 hr tablet Take 0.5 tablets (15 mg total) by mouth daily. 15 tablet 6   lanthanum  (FOSRENOL ) 1000 MG chewable tablet Chew 2,000-3,000 mg by mouth See admin instructions. Take 3 tablets (3000 mg) by mouth with meals and take 2 tablets (2000 mg) with snacks     lidocaine  (XYLOCAINE ) 2 % solution Use as directed 15 mLs in the mouth or throat every 4 (four) hours as needed for mouth pain (Mix 1:1 with Maalox and swish and spit). 100 mL 1   lidocaine -prilocaine  (EMLA ) cream Apply to affected area once 30 g 3   loperamide  (IMODIUM  A-D) 2 MG tablet Take 2 mg by mouth 4 (four) times daily as needed for diarrhea or loose stools.     loperamide  (IMODIUM ) 2 MG capsule Take 2 tabs by mouth with first loose stool, then 1 tab with each additional loose stool as needed. Do not exceed 8 tabs in a 24-hour period 60 capsule 3   nebivolol  (BYSTOLIC ) 10 MG tablet TAKE 1 TABLET BY MOUTH IN THE MORNING AND AT BEDTIME 180 tablet 0   Nebulizers (COMPRESSOR/NEBULIZER) MISC 1 Units by Does not apply route daily as needed. 1 each 0   nystatin (MYCOSTATIN) 100000 UNIT/ML suspension Take 5 mLs (500,000 Units total) by mouth 4 (four) times daily for 7 days. 60 mL 0   OLANZapine  (ZYPREXA ) 5 MG tablet Take 1 tablet (5 mg total) by mouth at bedtime. 60 tablet 1   ondansetron  (ZOFRAN ) 8 MG tablet Take 1 tablet (8 mg total) by mouth every 8 (eight) hours as needed for nausea or vomiting. Start on the third day after chemotherapy. 30 tablet 1   ondansetron  (ZOFRAN -ODT) 4  MG disintegrating tablet Take 1 tablet by mouth every 8 (eight) hours as needed for vomiting or nausea.     oxyCODONE  (OXY IR/ROXICODONE ) 5 MG immediate release tablet Take 1 tablet (5 mg total) by mouth every 4 (four) hours as needed for moderate pain (pain score 4-6) or breakthrough pain. 35 tablet 0   pantoprazole  (PROTONIX ) 40 MG tablet Take 1 tablet (40 mg total) by mouth 2 (two) times daily before a meal. 60 tablet 11   phenol (CHLORASEPTIC) 1.4 % LIQD Use as directed 1 spray in the mouth or throat as needed for throat irritation / pain. 118 mL 0   predniSONE  (DELTASONE ) 20 MG tablet Take 2 tablets by mouth daily x 2 days; then 1 tablet by mouth daily x 3 days; then half tablet by mouth daily x 3 days and stop prednisone . 10 tablet 0   prochlorperazine  (COMPAZINE ) 10 MG tablet Take 1 tablet (10 mg total) by mouth every 6 (six) hours as needed for nausea or vomiting. 30 tablet 1   No current facility-administered medications for this  visit.   Facility-Administered Medications Ordered in Other Visits  Medication Dose Route Frequency Provider Last Rate Last Admin   0.9 %  sodium chloride  infusion   Intravenous Continuous Jannat Rosemeyer, MD 10 mL/hr at 05/29/24 0918 New Bag at 05/29/24 0918   sacituzumab govitecan-hziy (TRODELVY) 810 mg in sodium chloride  0.9 % 250 mL (2.4471 mg/mL) chemo infusion  810 mg Intravenous Once Raha Tennison, MD 110 mL/hr at 05/29/24 1041 810 mg at 05/29/24 1041    REVIEW OF SYSTEMS:   Constitutional: Denies fevers, chills or abnormal weight loss Eyes: Denies blurriness of vision Ears, nose, mouth, throat, and face: Denies mucositis or sore throat Respiratory: Denies cough, dyspnea or wheezes Cardiovascular: Denies palpitation, chest discomfort or lower extremity swelling Gastrointestinal:  Denies nausea, heartburn or change in bowel habits Skin: Denies abnormal skin rashes Lymphatics: Denies new lymphadenopathy or easy bruising Neurological:Denies  numbness, tingling or new weaknesses Behavioral/Psych: Mood is stable, no new changes  All other systems were reviewed with the patient and are negative.   VITALS:  There were no vitals taken for this visit.  Wt Readings from Last 3 Encounters:  05/29/24 177 lb 11.1 oz (80.6 kg)  05/26/24 180 lb 8.9 oz (81.9 kg)  05/20/24 180 lb (81.6 kg)    There is no height or weight on file to calculate BMI.  Performance status (ECOG): 0 - Asymptomatic  PHYSICAL EXAM:   GENERAL:alert, no distress and comfortable SKIN: skin color, texture, turgor are normal, no rashes or significant lesions EYES: normal, Conjunctiva are pink and non-injected, sclera clear LUNGS: clear to auscultation and percussion with normal breathing effort HEART: regular rate & rhythm and no murmurs and no lower extremity edema ABDOMEN:abdomen soft, non-tender and normal bowel sounds Musculoskeletal:no cyanosis of digits and no clubbing  NEURO: alert & oriented x 3 with fluent speech, no focal motor/sensory deficits  LABORATORY DATA:  I have reviewed the data as listed   Lab Results  Component Value Date   WBC 16.8 (H) 05/29/2024   NEUTROABS 14.8 (H) 05/29/2024   HGB 9.1 (L) 05/29/2024   HCT 28.8 (L) 05/29/2024   MCV 113.4 (H) 05/29/2024   PLT 261 05/29/2024      Chemistry      Component Value Date/Time   NA 143 05/29/2024 0757   K 3.8 05/29/2024 0757   CL 100 05/29/2024 0757   CO2 28 05/29/2024 0757   BUN 58 (H) 05/29/2024 0757   CREATININE 8.07 (H) 05/29/2024 0757   CREATININE 9.08 (H) 01/10/2024 1113      Component Value Date/Time   CALCIUM  9.1 05/29/2024 0757   ALKPHOS 144 (H) 05/29/2024 0757   AST 34 05/29/2024 0757   ALT 26 05/29/2024 0757   BILITOT 0.2 05/29/2024 0757        Latest Reference Range & Units 04/17/24 08:04  TSH 0.350 - 4.500 uIU/mL 1.975  Thyroxine (T4) 4.5 - 12.0 ug/dL 89.9     Latest Reference Range & Units 04/08/24 09:44 04/08/24 10:43  Iron  28 - 170 ug/dL 40   UIBC  ug/dL 835   TIBC 749 - 549 ug/dL 795 (L)   Saturation Ratios 10.4 - 31.8 % 20   Ferritin 11 - 307 ng/mL 924 (H)   Folate >5.9 ng/mL  37.2  Vitamin B12 180 - 914 pg/mL 451   (L): Data is abnormally low (H): Data is abnormally high   Latest Reference Range & Units 04/08/24 10:43  CA 15-3 0.0 - 25.0 U/mL 67.0 (H)  CA  27.29 0.0 - 38.6 U/mL 98.7 (H)  (H): Data is abnormally high  RADIOGRAPHIC STUDIES: I have personally reviewed the radiological images as listed and agreed with the findings in the report.  NM PET Image Restag (PS) Skull Base To Thigh CLINICAL DATA:  Subsequent treatment strategy for breast cancer.  EXAM: NUCLEAR MEDICINE PET SKULL BASE TO THIGH  TECHNIQUE: 8.8 mCi F-18 FDG was injected intravenously. Full-ring PET imaging was performed from the skull base to thigh after the radiotracer. CT data was obtained and used for attenuation correction and anatomic localization.  Fasting blood glucose: 122 mg/dl  COMPARISON:  93/80/7974.  FINDINGS: Mediastinal blood pool activity: SUV max 2.7  Liver activity: SUV max NA  NECK:  Low left internal jugular lymph hypermetabolic lymph nodes, SUV max 8.0.  Incidental CT findings:  None.  CHEST:  New hypermetabolic nodules and masses throughout the lungs with hypermetabolic mediastinal, hilar, left subpectoral/axillary and extrapleural adenopathy.  Incidental CT findings:  Right IJ Port-A-Cath terminates in the low SVC. Atherosclerotic calcification of the aorta and coronary arteries. Heart is enlarged. There may be trace right pleural fluid. No pericardial effusion.  ABDOMEN/PELVIS:  New right hepatic lobe metastasis measures 1.7 cm, SUV max 12.0. New hypermetabolic retrocaval 8 mm lymph node (202/88), SUV max 7.5.  Incidental CT findings:  Cholecystectomy. Kidneys are atrophic with small high and low-attenuation lesions, too small to characterize. Numerous bilateral renal stones.  SKELETON:  Focal  intercostal metastasis between the posterior right eleventh and twelfth ribs, SUV max 6.6. No definite osseous hypermetabolism.  Incidental CT findings:  Minimal degenerative change in the spine.  IMPRESSION: 1. Marked progression of metastatic breast cancer as evidenced by widespread pulmonary metastatic disease, progressive low neck, chest and upper abdominal adenopathy, new liver metastasis and new intercostal metastasis. 2. Bilateral renal stones.  Electronically Signed   By: Newell Eke M.D.   On: 05/24/2024 11:21

## 2024-05-29 NOTE — Progress Notes (Signed)
 1343-patient called out complaining of wheezing. MD made aware, vitals obtained and stable.   1349 albuterol  nebulizer given per hypersensitivity protocol.   1351-MD in room, orders to give solumedrol 125 IV  1357 solumedrol give per orders.Per MD ok to restart Trodelvy after Solumedrol.  Treatment was completed.   1500-MD in room to re-evaluate prior to leaving. Encouraged pt to use her nebulizer and breathing treatments tonight and tomorrow. Per MD-she did hear some wheezing on her right side as she auscultated.  Treatment given per orders. Patient tolerated it well without problems. Vitals stable and discharged home from clinic wheelchair. Follow up as scheduled.   Hypersensitivity Reaction note  Date of event: 05/29/24 Time of event: 1343 Generic name of drug involved: Sacituzumab govitecan-hziy wilfred) Name of provider notified of the hypersensitivity reaction: Dr. Davonna Was agent that likely caused hypersensitivity reaction added to Allergies List within EMR? yes Chain of events including reaction signs/symptoms, treatment administered, and outcome (e.g., drug resumed; drug discontinued; sent to Emergency Department; etc.) see above note.   Zamari Bonsall, Dorothyann Hasty, RN 05/29/2024 3:33 PM

## 2024-05-29 NOTE — Patient Instructions (Signed)
 [facility] - A DEPT OF [facility]  Discharge Instructions: Thank you for choosing [facility] to provide your oncology and hematology care.  If you have a lab appointment with the Cancer Center - please note that after April 8th, 2024, all labs will be drawn in the cancer center.  You do not have to check in or register with the main entrance as you have in the past but will complete your check-in in the cancer center.  Wear comfortable clothing and clothing appropriate for easy access to any Portacath or PICC line.   We strive to give you quality time with your provider. You may need to reschedule your appointment if you arrive late (15 or more minutes).  Arriving late affects you and other patients whose appointments are after yours.  Also, if you miss three or more appointments without notifying the office, you may be dismissed from the clinic at the provider's discretion.      For prescription refill requests, have your pharmacy contact our office and allow 72 hours for refills to be completed.    Today you received the following chemotherapy and/or immunotherapy agents Sacituzumab govitecan-hziy   To help prevent nausea and vomiting after your treatment, we encourage you to take your nausea medication as directed.  BELOW ARE SYMPTOMS THAT SHOULD BE REPORTED IMMEDIATELY: *FEVER GREATER THAN 100.4 F (38 C) OR HIGHER *CHILLS OR SWEATING *NAUSEA AND VOMITING THAT IS NOT CONTROLLED WITH YOUR NAUSEA MEDICATION *UNUSUAL SHORTNESS OF BREATH *UNUSUAL BRUISING OR BLEEDING *URINARY PROBLEMS (pain or burning when urinating, or frequent urination) *BOWEL PROBLEMS (unusual diarrhea, constipation, pain near the anus) TENDERNESS IN MOUTH AND THROAT WITH OR WITHOUT PRESENCE OF ULCERS (sore throat, sores in mouth, or a toothache) UNUSUAL RASH, SWELLING OR PAIN  UNUSUAL VAGINAL DISCHARGE OR ITCHING   Items with * indicate a potential emergency and should  be followed up as soon as possible or go to the Emergency Department if any problems should occur.  Please show the CHEMOTHERAPY ALERT CARD or IMMUNOTHERAPY ALERT CARD at check-in to the Emergency Department and triage nurse.  Should you have questions after your visit or need to cancel or reschedule your appointment, please contact [facility] [place] - A DEPT OF [name] [phone]  and follow the prompts.  Office hours are 8:00 a.m. to 4:30 p.m. Monday - Friday. Please note that voicemails left after 4:00 p.m. may not be returned until the following business day.  We are closed weekends and major holidays. You have access to a nurse at all times for urgent questions. Please call the main number to the clinic [phone] and follow the prompts.  For any non-urgent questions, you may also contact your provider using MyChart. We now offer e-Visits for anyone 67 and older to request care online for non-urgent symptoms. For details visit [email or web address].   Also download the MyChart app! Go to the app store, search MyChart, open the app, select [place], and log in with your MyChart username and password.

## 2024-05-29 NOTE — Progress Notes (Signed)
 Ok to treat with today's labs - patient on dialysis M-W-F  Patient with new weight 80.6 kg = adjust dose of Trodelvy 10 mg/kg to 810 mg.  V.O. Dr Ivery Molt, PharmD

## 2024-05-30 ENCOUNTER — Other Ambulatory Visit: Payer: Self-pay | Admitting: Oncology

## 2024-05-30 ENCOUNTER — Other Ambulatory Visit: Payer: Self-pay

## 2024-05-30 DIAGNOSIS — C50911 Malignant neoplasm of unspecified site of right female breast: Secondary | ICD-10-CM

## 2024-05-30 DIAGNOSIS — D509 Iron deficiency anemia, unspecified: Secondary | ICD-10-CM | POA: Diagnosis not present

## 2024-05-30 DIAGNOSIS — N186 End stage renal disease: Secondary | ICD-10-CM | POA: Diagnosis not present

## 2024-05-30 DIAGNOSIS — N2581 Secondary hyperparathyroidism of renal origin: Secondary | ICD-10-CM | POA: Diagnosis not present

## 2024-05-30 DIAGNOSIS — D631 Anemia in chronic kidney disease: Secondary | ICD-10-CM | POA: Diagnosis not present

## 2024-05-30 DIAGNOSIS — E1129 Type 2 diabetes mellitus with other diabetic kidney complication: Secondary | ICD-10-CM | POA: Diagnosis not present

## 2024-05-30 DIAGNOSIS — Z992 Dependence on renal dialysis: Secondary | ICD-10-CM | POA: Diagnosis not present

## 2024-05-30 NOTE — Progress Notes (Signed)
 24 hour follow up-called husband today, pt is in dialysis today. Had to leave VM due to no answer.

## 2024-05-31 ENCOUNTER — Other Ambulatory Visit: Payer: Self-pay

## 2024-05-31 ENCOUNTER — Encounter (HOSPITAL_COMMUNITY): Payer: Self-pay

## 2024-05-31 ENCOUNTER — Emergency Department (HOSPITAL_COMMUNITY)
Admission: EM | Admit: 2024-05-31 | Discharge: 2024-05-31 | Disposition: A | Discharge status: Home / Self Care | Attending: Emergency Medicine | Admitting: Emergency Medicine

## 2024-05-31 DIAGNOSIS — E1165 Type 2 diabetes mellitus with hyperglycemia: Secondary | ICD-10-CM | POA: Diagnosis not present

## 2024-05-31 DIAGNOSIS — Z992 Dependence on renal dialysis: Secondary | ICD-10-CM | POA: Diagnosis not present

## 2024-05-31 DIAGNOSIS — R739 Hyperglycemia, unspecified: Secondary | ICD-10-CM | POA: Diagnosis present

## 2024-05-31 DIAGNOSIS — E1122 Type 2 diabetes mellitus with diabetic chronic kidney disease: Secondary | ICD-10-CM | POA: Insufficient documentation

## 2024-05-31 DIAGNOSIS — Z7982 Long term (current) use of aspirin: Secondary | ICD-10-CM | POA: Diagnosis not present

## 2024-05-31 DIAGNOSIS — N186 End stage renal disease: Secondary | ICD-10-CM | POA: Diagnosis not present

## 2024-05-31 DIAGNOSIS — Z853 Personal history of malignant neoplasm of breast: Secondary | ICD-10-CM | POA: Insufficient documentation

## 2024-05-31 DIAGNOSIS — I509 Heart failure, unspecified: Secondary | ICD-10-CM | POA: Diagnosis not present

## 2024-05-31 LAB — CBC
HCT: 29.1 % — ABNORMAL LOW (ref 36.0–46.0)
Hemoglobin: 9.2 g/dL — ABNORMAL LOW (ref 12.0–15.0)
MCH: 35.5 pg — ABNORMAL HIGH (ref 26.0–34.0)
MCHC: 31.6 g/dL (ref 30.0–36.0)
MCV: 112.4 fL — ABNORMAL HIGH (ref 80.0–100.0)
Platelets: 276 K/uL (ref 150–400)
RBC: 2.59 MIL/uL — ABNORMAL LOW (ref 3.87–5.11)
RDW: 18.4 % — ABNORMAL HIGH (ref 11.5–15.5)
WBC: 14 K/uL — ABNORMAL HIGH (ref 4.0–10.5)
nRBC: 0 % (ref 0.0–0.2)

## 2024-05-31 LAB — COMPREHENSIVE METABOLIC PANEL WITH GFR
ALT: 24 U/L (ref 0–44)
AST: 21 U/L (ref 15–41)
Albumin: 3.6 g/dL (ref 3.5–5.0)
Alkaline Phosphatase: 161 U/L — ABNORMAL HIGH (ref 38–126)
Anion gap: 20 — ABNORMAL HIGH (ref 5–15)
BUN: 76 mg/dL — ABNORMAL HIGH (ref 8–23)
CO2: 25 mmol/L (ref 22–32)
Calcium: 8.7 mg/dL — ABNORMAL LOW (ref 8.9–10.3)
Chloride: 95 mmol/L — ABNORMAL LOW (ref 98–111)
Creatinine, Ser: 8.11 mg/dL — ABNORMAL HIGH (ref 0.44–1.00)
GFR, Estimated: 5 mL/min — ABNORMAL LOW (ref >60)
Glucose, Bld: 239 mg/dL — ABNORMAL HIGH (ref 70–99)
Potassium: 4.1 mmol/L (ref 3.5–5.1)
Sodium: 139 mmol/L (ref 135–145)
Total Bilirubin: 0.2 mg/dL (ref 0.0–1.2)
Total Protein: 6.5 g/dL (ref 6.5–8.1)

## 2024-05-31 LAB — CBG MONITORING, ED: Glucose-Capillary: 232 mg/dL — ABNORMAL HIGH (ref 70–99)

## 2024-05-31 MED ORDER — SODIUM CHLORIDE 0.9 % IV BOLUS
500.0000 mL | Freq: Once | INTRAVENOUS | Status: AC
  Administered 2024-05-31: 500 mL via INTRAVENOUS

## 2024-05-31 NOTE — ED Notes (Signed)
 Pt/family received d/c paperwork at this time. After going over the paperwork any questions, comments, or concerns were answered to the best of this nurse's knowledge. The pt/family verbally acknowledged the teachings/instructions.

## 2024-05-31 NOTE — ED Triage Notes (Signed)
 Pt presents with elevated glucose level today. She has a previous hx of DM2 that has been controlled with diet only for the last 10 years. Pt is currently undergoing chemo treatment for breast CA with mets. Her last treatment was 2 days ago and she is receiving steroids during her treatment. Pt reports she has had excessive dry mouth and thirst and has been drinking Kool-aide.  Pt is on 2L O2 via N/C at baseline.

## 2024-05-31 NOTE — Discharge Instructions (Addendum)
 Thank you for visiting the Emergency Department today. It was a pleasure to be part of your healthcare team. Your test results showed hyperglycemia. You have been treated with IV fluids, and you should take your medications as directed. If you have any questions about your medicines, please call your pharmacy or healthcare provider. At home, rest and try and avoid sugary foods and drinks. It is important to watch for warning signs such as worsening pain, fever, trouble breathing. If any of these happen, return to the Emergency Department or call 911. Please follow up with your primary care provider and your kidney specialist. Thank you for trusting us  with your health.

## 2024-05-31 NOTE — ED Provider Notes (Signed)
 [place] [facility] AT [facility] Provider Note   CSN: [id number] Arrival date & time: 05/31/24  1511     Patient presents with: Hyperglycemia   Megan [name] is a 63 y.o. female with a history of breast cancer currently undergoing chemotherapy, and dialysis presents to the ED with hyperglycemia that began this morning.  Patient states she had a home glucose reading of 300.  Patient states that she is currently undergoing chemo and that she struggles with her appetite - she finds that the things she can tolerate are snacks and food high in sugar.  Patient states that she is also on dialysis Monday/Wednesday/Friday and has not missed any appointments.   The patient describes her current symptoms as weakness and feeling shaky.  She states that she has had increased thirst and feels like her mouth is dry.  Patient reports that she has not tried drinking any water  today as she is on a 32 ounce restricted fluid diet.  She denies any chest pain, shortness of breath, increased welling, headache. No recent travel. No sick contacts. The patient's social history is notable for no tobacco, alcohol, or drug use.  Patient husband is present for today's visit.    Hyperglycemia Associated symptoms: weakness   Associated symptoms: no abdominal pain, no chest pain, no fever, no nausea, no shortness of breath and no vomiting        Prior to Admission medications   Medication Sig Start Date End Date Taking? Authorizing Provider  acetaminophen  (TYLENOL ) 500 MG tablet Take 1,000 mg by mouth every 8 (eight) hours as needed for moderate pain (pain score 4-6).    [provider]  albuterol  (PROVENTIL ) (2.5 MG/3ML) 0.083% nebulizer solution Take 3 mLs (2.5 mg total) by nebulization every 4 (four) hours as needed for wheezing or shortness of breath. 05/15/24 05/15/25  Pearlean Manus, MD  albuterol  (VENTOLIN  HFA) 108 (90 Base) MCG/ACT inhaler Inhale 2 puffs into the lungs every  6 (six) hours as needed for wheezing or shortness of breath. 05/15/24   Pearlean Manus, MD  amoxicillin -clavulanate (AUGMENTIN ) 500-125 MG tablet Take 1 tablet by mouth daily for 5 days. Take it after HD on dialysis days. 05/27/24 06/01/24  Ricky Fines, MD  aspirin  EC 81 MG tablet Take 81 mg by mouth daily.    [provider]  atorvastatin  (LIPITOR) 20 MG tablet TAKE 1 TABLET(20 MG) BY MOUTH DAILY 02/11/24   Duanne Butler DASEN, MD  B Complex-C-Zn-Folic Acid  (DIALYVITE  800-ZINC  15) 0.8 MG TABS Take 1 tablet by mouth daily. 06/19/22   [provider]  benzonatate  (TESSALON ) 100 MG capsule Take 1 capsule (100 mg total) by mouth 3 (three) times daily. 05/29/24 06/28/24  Davonna Siad, MD  chlorpheniramine-HYDROcodone  (TUSSIONEX) 10-8 MG/5ML Take 5 mLs by mouth every 12 (twelve) hours as needed (Severe coughing spells.). 05/26/24   Ricky Fines, MD  cinacalcet  (SENSIPAR ) 90 MG tablet Take 90 mg by mouth daily. 03/17/24   [provider]  Darbepoetin Alfa  (ARANESP , ALBUMIN FREE, IJ) Darbepoetin Alfa  (Aranesp ) 07/06/23 07/04/24  [provider]  dexamethasone  (DECADRON ) 4 MG tablet Take 2 tablets (8 mg) daily for 3 days. Start the day after chemotherapy. 05/29/24   Kandala, Hyndavi, MD  diphenoxylate -atropine  (LOMOTIL ) 2.5-0.025 MG tablet Take 1 tablet by mouth 4 (four) times daily as needed for diarrhea or loose stools. 05/06/24   Davonna Siad, MD  fentaNYL  (DURAGESIC ) 50 MCG/HR Place 1 patch onto the skin every 3 (three) days. 05/29/24   Ricky,  Eric, MD  gabapentin  (NEURONTIN ) 300 MG capsule TAKE 1 CAPSULE(300 MG) BY MOUTH AT BEDTIME 06/12/23   Duanne Butler DASEN, MD  guaiFENesin -codeine  100-10 MG/5ML syrup Take 10 mLs by mouth. 05/16/24   [provider]  isosorbide  mononitrate (IMDUR ) 30 MG 24 hr tablet Take 0.5 tablets (15 mg total) by mouth daily. 03/28/24   Alvan Dorn FALCON, MD  lanthanum  (FOSRENOL ) 1000 MG chewable tablet Chew 2,000-3,000 mg by  mouth See admin instructions. Take 3 tablets (3000 mg) by mouth with meals and take 2 tablets (2000 mg) with snacks 10/16/19   [provider]  lidocaine  (XYLOCAINE ) 2 % solution Use as directed 15 mLs in the mouth or throat every 4 (four) hours as needed for mouth pain (Mix 1:1 with Maalox and swish and spit). 05/22/24   Kandala, Hyndavi, MD  lidocaine -prilocaine  (EMLA ) cream Apply to affected area once 05/29/24   Kandala, Hyndavi, MD  loperamide  (IMODIUM  A-D) 2 MG tablet Take 2 mg by mouth 4 (four) times daily as needed for diarrhea or loose stools.    [provider]  loperamide  (IMODIUM ) 2 MG capsule Take 2 tabs by mouth with first loose stool, then 1 tab with each additional loose stool as needed. Do not exceed 8 tabs in a 24-hour period 05/29/24   Davonna Siad, MD  nebivolol  (BYSTOLIC ) 10 MG tablet TAKE 1 TABLET BY MOUTH IN THE MORNING AND AT BEDTIME 02/19/24   Duanne Butler DASEN, MD  Nebulizers (COMPRESSOR/NEBULIZER) MISC 1 Units by Does not apply route daily as needed. 05/15/24   Pearlean Manus, MD  nystatin (MYCOSTATIN) 100000 UNIT/ML suspension Take 5 mLs (500,000 Units total) by mouth 4 (four) times daily for 7 days. 05/26/24 06/02/24  Ricky Eric, MD  OLANZapine  (ZYPREXA ) 5 MG tablet Take 1 tablet (5 mg total) by mouth at bedtime. 05/06/24   Kandala, Hyndavi, MD  ondansetron  (ZOFRAN ) 8 MG tablet Take 1 tablet (8 mg total) by mouth every 8 (eight) hours as needed for nausea or vomiting. Start on the third day after chemotherapy. 05/29/24   Davonna Siad, MD  ondansetron  (ZOFRAN -ODT) 4 MG disintegrating tablet Take 1 tablet by mouth every 8 (eight) hours as needed for vomiting or nausea. 04/23/24   [provider]  oxyCODONE  (OXY IR/ROXICODONE ) 5 MG immediate release tablet Take 1 tablet (5 mg total) by mouth every 4 (four) hours as needed for moderate pain (pain score 4-6) or breakthrough pain. 05/26/24   Ricky Eric, MD  pantoprazole  (PROTONIX ) 40 MG tablet  Take 1 tablet (40 mg total) by mouth 2 (two) times daily before a meal. 10/08/23   Rourk, Lamar HERO, MD  phenol (CHLORASEPTIC) 1.4 % LIQD Use as directed 1 spray in the mouth or throat as needed for throat irritation / pain. 05/26/24   Ricky Eric, MD  predniSONE  (DELTASONE ) 20 MG tablet Take 2 tablets by mouth daily x 2 days; then 1 tablet by mouth daily x 3 days; then half tablet by mouth daily x 3 days and stop prednisone . 05/26/24   Ricky Eric, MD  prochlorperazine  (COMPAZINE ) 10 MG tablet Take 1 tablet (10 mg total) by mouth every 6 (six) hours as needed for nausea or vomiting. 05/29/24   Davonna Siad, MD    Allergies: Tana jude govitecan-hziy] and Motrin [ibuprofen]    Review of Systems  Constitutional:  Negative for fever and unexpected weight change.  Respiratory:  Negative for chest tightness and shortness of breath.   Cardiovascular:  Negative for chest pain.  Gastrointestinal:  Negative for abdominal pain, diarrhea, nausea and vomiting.  Genitourinary:  Positive for difficulty urinating (Patient is on restricted fluids and states she has reduced output).  Neurological:  Positive for tremors (She reports feeling shaky) and weakness. Negative for light-headedness and headaches.    Updated Vital Signs BP 116/60   Pulse 83   Temp 98.2 F (36.8 C) (Oral)   Resp 18   Ht 5' 2 (1.575 m)   Wt 79.8 kg   SpO2 94%   BMI 32.19 kg/m   Physical Exam Vitals and nursing note reviewed.  Constitutional:      General: She is not in acute distress.    Appearance: Normal appearance.  HENT:     Head: Normocephalic and atraumatic.  Eyes:     Extraocular Movements: Extraocular movements intact.     Conjunctiva/sclera: Conjunctivae normal.     Pupils: Pupils are equal, round, and reactive to light.  Cardiovascular:     Rate and Rhythm: Normal rate and regular rhythm.     Pulses: Normal pulses.  Pulmonary:     Effort: Pulmonary effort is normal. No respiratory  distress.  Abdominal:     General: Abdomen is flat.     Palpations: Abdomen is soft.     Tenderness: There is no abdominal tenderness.  Musculoskeletal:        General: Normal range of motion.     Cervical back: Normal range of motion.  Skin:    General: Skin is warm and dry.     Capillary Refill: Capillary refill takes less than 2 seconds.  Neurological:     General: No focal deficit present.     Mental Status: She is alert. Mental status is at baseline.  Psychiatric:        Mood and Affect: Mood normal.     (all labs ordered are listed, but only abnormal results are displayed) Labs Reviewed  CBC - Abnormal; Notable for the following components:      Result Value   WBC 14.0 (*)    RBC 2.59 (*)    Hemoglobin 9.2 (*)    HCT 29.1 (*)    MCV 112.4 (*)    MCH 35.5 (*)    RDW 18.4 (*)    All other components within normal limits  COMPREHENSIVE METABOLIC PANEL WITH GFR - Abnormal; Notable for the following components:   Chloride 95 (*)    Glucose, Bld 239 (*)    BUN 76 (*)    Creatinine, Ser 8.11 (*)    Calcium  8.7 (*)    Alkaline Phosphatase 161 (*)    GFR, Estimated 5 (*)    Anion gap 20 (*)    All other components within normal limits  CBG MONITORING, ED - Abnormal; Notable for the following components:   Glucose-Capillary 232 (*)    All other components within normal limits  URINALYSIS, ROUTINE W REFLEX MICROSCOPIC  CBG MONITORING, ED    EKG: None  Radiology: No results found.   Procedures   Medications Ordered in the ED  sodium chloride  0.9 % bolus 500 mL (0 mLs Intravenous Stopped 05/31/24 1945)                                    Medical Decision Making Amount and/or Complexity of Data Reviewed Labs: ordered.   Patient presents to the ED for concern of hyperglycemia, this involves an extensive number of treatment options,  and is a complaint that carries with it a high risk of complications and morbidity.    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 The patient and husband present for today's visit were reliable historians providing a clear, detailed, and consistent account of the presenting symptoms and relevant medical history. The information was obtained directly from the patient and her husband and statements were documented in the patient's own words when possible. No discrepancies were noted between the history provided and available collateral sources.     Lab Tests: I ordered, and personally interpreted labs.   The pertinent results include:  Glucose - 239 Creatinine - 8.11 BUN - 76 GFR - 5 Anion gap - 20 Alkaline Phosphatase - 161  WBC - 14.0  Imaging Studies ordered: No imaging was ordered at today's visit.  Cardiac Monitoring: The patient was maintained on a cardiac monitor.  I personally viewed and interpreted the cardiac monitored which showed an underlying rhythm of: Normal sinus.  Medicines ordered and prescription drug management: I ordered medications: including sodium chloride  0.9% bolus 500 mL for hyperglycemia Reevaluation of the patient after these medicines showed that the patient improved I have reviewed the patients home medicines and have made adjustments as needed  Test Considered: Diagnostic testing was considered based on the patient's presenting symptoms, risk factors, and initial clinical assessment.  No additional diagnostic testing was considered at today's appointment. The approach to diagnostic testing prioritized exclusion of  life-threatening conditions  Problem List / ED Course: Problem List: - Hyperglycemia Emergency Department Course: 63 y.o. female with a history of breast cancer currently undergoing chemotherapy, and dialysis presents to the ED with hyperglycemia that began this morning.  Patient states she had a home glucose reading of 300.  Patient states that she is currently undergoing chemo and that she struggles with her appetite - she finds that the things she can tolerate are snacks and food high in sugar.  Patient states that she is also on dialysis Monday/Wednesday/Friday and has not missed any appointments.   The patient describes her current symptoms as weakness and feeling shaky.  She states that she has had increased thirst and feels like her mouth is dry.  Patient reports that she has not tried drinking any water  today as she is on a 32 ounce restricted fluid diet.  She denies any chest pain, shortness of breath, increased welling, headache. Initial assessment included history, physical exam, and review of prior medical records.  EKG was completed and was free of any acute ischemia.  CMP with GFR, CBC, CBG assessed. The patient's CMP showed a glucose of 239, creatinine of 8.11, GFR 5, anion gap 20. Abnormal CMP levels consistent with patient's chronic kidney disease for which she is currently on dialysis.  CBC abnormal levels are consistent with previous readings than her current chemotherapy treatment.  Patient was given a 500 mL sodium chloride  0.9% bolus for hyperglycemia.  Patient's current ejection fraction and her heart failure and end-stage kidney disease requiring dialysis when assessing bolus dosing.  Differential diagnosis considered included for hyperglycemia induced by chronic steroid use due to chemotherapy regimen, infection, stress, diabetes mellitus.  Given patient  history, physical exam, diagnostic findings unable to rule out hyperglycemia induced by steroid use or stress as these are both a  possibility.  Given physical exam and negative patient symptoms makes infection unlikely however given CBC had an elevated white blood cell count, likely due to ongoing chemotherapy treatment, unable to rule out.  That has a prior history of type 2 diabetes which is likely contributing to hyperglycemic episode.  Reevaluation: After the interventions noted above, I reevaluated the patient and found that they have :improved  Social Determinants of Health: Patient screened for adverse SDOH domains per ED protocol. Screening included assessment of housing instability, food insecurity, transportation difficulties, and trouble paying for utilities. No adverse SDOH identified requiring intervention at this time. Social work consult not indicated.   Dispostion: After consideration of the diagnostic results and the patients response to treatment, I feel that the patent would benefit from discharge home and close follow-up with PCP and nephrologist.    Response to ED treatment: improved. no acute deterioration noted during ED stay.   Disposition Plan: The patient is medically stable for discharge from the Emergency Department at this time. Vital signs are within normal limits, and the patient is alert, oriented, and in no acute distress. Diagnostic evaluation and therapeutic interventions have been completed with no findings necessitating hospital admission or further emergent workup. The treatment plan, diagnostic results, and clinical impression were discussed with the patient and husband.  The patient demonstrated understanding of the discharge diagnosis, dietary recommendations of low sugar, and follow-up plan. Strict return precautions were reviewed, including instructions to return to the Emergency Department or contact emergency medical services (911) for any worsening or new symptoms such as chest pain, shortness of breath, syncope, uncontrolled pain, persistent vomiting, fever, bleeding, or neurologic  changes. The patient was encouraged to follow up with their primary care provider and nephrology services as directed. The patient was discharged home in stable condition, ambulatory with steady gait, and with all personal belongings in their possession.  Communication:   Patient and husband informed of disposition decision and rationale. Questions addressed.        Final diagnoses:  Hyperglycemia    ED Discharge Orders     None          Willma Duwaine CROME, [place] 05/31/24 2031    Cleotilde Rogue, MD 06/02/24 [phone]

## 2024-06-02 DIAGNOSIS — D509 Iron deficiency anemia, unspecified: Secondary | ICD-10-CM | POA: Diagnosis not present

## 2024-06-02 DIAGNOSIS — E1129 Type 2 diabetes mellitus with other diabetic kidney complication: Secondary | ICD-10-CM | POA: Diagnosis not present

## 2024-06-02 DIAGNOSIS — N186 End stage renal disease: Secondary | ICD-10-CM | POA: Diagnosis not present

## 2024-06-02 DIAGNOSIS — D631 Anemia in chronic kidney disease: Secondary | ICD-10-CM | POA: Diagnosis not present

## 2024-06-02 DIAGNOSIS — N2581 Secondary hyperparathyroidism of renal origin: Secondary | ICD-10-CM | POA: Diagnosis not present

## 2024-06-02 DIAGNOSIS — Z992 Dependence on renal dialysis: Secondary | ICD-10-CM | POA: Diagnosis not present

## 2024-06-03 ENCOUNTER — Other Ambulatory Visit: Payer: Self-pay

## 2024-06-03 MED ORDER — NYSTATIN 100000 UNIT/ML MT SUSP: 5.0000 mL | Freq: Four times a day (QID) | OROMUCOSAL | 0 refills | Status: DC

## 2024-06-03 MED ORDER — GUAIFENESIN-CODEINE 100-10 MG/5ML PO SOLN: 5.0000 mL | Freq: Two times a day (BID) | ORAL | 0 refills | Status: DC | PRN

## 2024-06-04 DIAGNOSIS — D509 Iron deficiency anemia, unspecified: Secondary | ICD-10-CM | POA: Diagnosis not present

## 2024-06-04 DIAGNOSIS — N186 End stage renal disease: Secondary | ICD-10-CM | POA: Diagnosis not present

## 2024-06-04 DIAGNOSIS — D631 Anemia in chronic kidney disease: Secondary | ICD-10-CM | POA: Diagnosis not present

## 2024-06-04 DIAGNOSIS — E1129 Type 2 diabetes mellitus with other diabetic kidney complication: Secondary | ICD-10-CM | POA: Diagnosis not present

## 2024-06-04 DIAGNOSIS — Z992 Dependence on renal dialysis: Secondary | ICD-10-CM | POA: Diagnosis not present

## 2024-06-04 DIAGNOSIS — N2581 Secondary hyperparathyroidism of renal origin: Secondary | ICD-10-CM | POA: Diagnosis not present

## 2024-06-05 ENCOUNTER — Other Ambulatory Visit: Payer: Self-pay | Admitting: Oncology

## 2024-06-05 ENCOUNTER — Inpatient Hospital Stay: Admitting: Oncology

## 2024-06-05 ENCOUNTER — Other Ambulatory Visit: Payer: Self-pay | Admitting: *Deleted

## 2024-06-05 ENCOUNTER — Inpatient Hospital Stay

## 2024-06-05 VITALS — BP 99/56 | HR 83 | Temp 96.4°F | Resp 20 | Wt 172.7 lb

## 2024-06-05 DIAGNOSIS — N2581 Secondary hyperparathyroidism of renal origin: Secondary | ICD-10-CM | POA: Diagnosis not present

## 2024-06-05 DIAGNOSIS — T451X5A Adverse effect of antineoplastic and immunosuppressive drugs, initial encounter: Secondary | ICD-10-CM | POA: Diagnosis not present

## 2024-06-05 DIAGNOSIS — C50911 Malignant neoplasm of unspecified site of right female breast: Secondary | ICD-10-CM

## 2024-06-05 DIAGNOSIS — I959 Hypotension, unspecified: Secondary | ICD-10-CM | POA: Diagnosis not present

## 2024-06-05 DIAGNOSIS — D61818 Other pancytopenia: Secondary | ICD-10-CM | POA: Diagnosis not present

## 2024-06-05 DIAGNOSIS — E782 Mixed hyperlipidemia: Secondary | ICD-10-CM | POA: Diagnosis not present

## 2024-06-05 DIAGNOSIS — D61810 Antineoplastic chemotherapy induced pancytopenia: Secondary | ICD-10-CM

## 2024-06-05 DIAGNOSIS — I5032 Chronic diastolic (congestive) heart failure: Secondary | ICD-10-CM | POA: Diagnosis not present

## 2024-06-05 DIAGNOSIS — J9611 Chronic respiratory failure with hypoxia: Secondary | ICD-10-CM | POA: Diagnosis not present

## 2024-06-05 DIAGNOSIS — E1122 Type 2 diabetes mellitus with diabetic chronic kidney disease: Secondary | ICD-10-CM | POA: Diagnosis present

## 2024-06-05 DIAGNOSIS — Z515 Encounter for palliative care: Secondary | ICD-10-CM | POA: Diagnosis not present

## 2024-06-05 DIAGNOSIS — Z862 Personal history of diseases of the blood and blood-forming organs and certain disorders involving the immune mechanism: Secondary | ICD-10-CM | POA: Diagnosis not present

## 2024-06-05 DIAGNOSIS — R4589 Other symptoms and signs involving emotional state: Secondary | ICD-10-CM | POA: Diagnosis not present

## 2024-06-05 DIAGNOSIS — R5081 Fever presenting with conditions classified elsewhere: Secondary | ICD-10-CM | POA: Diagnosis not present

## 2024-06-05 DIAGNOSIS — D631 Anemia in chronic kidney disease: Secondary | ICD-10-CM | POA: Diagnosis not present

## 2024-06-05 DIAGNOSIS — Z992 Dependence on renal dialysis: Secondary | ICD-10-CM | POA: Diagnosis not present

## 2024-06-05 DIAGNOSIS — I132 Hypertensive heart and chronic kidney disease with heart failure and with stage 5 chronic kidney disease, or end stage renal disease: Secondary | ICD-10-CM | POA: Diagnosis present

## 2024-06-05 DIAGNOSIS — K219 Gastro-esophageal reflux disease without esophagitis: Secondary | ICD-10-CM | POA: Diagnosis not present

## 2024-06-05 DIAGNOSIS — R531 Weakness: Secondary | ICD-10-CM | POA: Diagnosis not present

## 2024-06-05 DIAGNOSIS — A419 Sepsis, unspecified organism: Secondary | ICD-10-CM | POA: Diagnosis not present

## 2024-06-05 DIAGNOSIS — Z66 Do not resuscitate: Secondary | ICD-10-CM | POA: Diagnosis not present

## 2024-06-05 DIAGNOSIS — R197 Diarrhea, unspecified: Secondary | ICD-10-CM

## 2024-06-05 DIAGNOSIS — D539 Nutritional anemia, unspecified: Secondary | ICD-10-CM | POA: Diagnosis not present

## 2024-06-05 DIAGNOSIS — R1084 Generalized abdominal pain: Secondary | ICD-10-CM | POA: Diagnosis not present

## 2024-06-05 DIAGNOSIS — K6289 Other specified diseases of anus and rectum: Secondary | ICD-10-CM | POA: Diagnosis not present

## 2024-06-05 DIAGNOSIS — C787 Secondary malignant neoplasm of liver and intrahepatic bile duct: Secondary | ICD-10-CM | POA: Diagnosis not present

## 2024-06-05 DIAGNOSIS — C7800 Secondary malignant neoplasm of unspecified lung: Secondary | ICD-10-CM | POA: Diagnosis not present

## 2024-06-05 DIAGNOSIS — C773 Secondary and unspecified malignant neoplasm of axilla and upper limb lymph nodes: Secondary | ICD-10-CM | POA: Diagnosis present

## 2024-06-05 DIAGNOSIS — I10 Essential (primary) hypertension: Secondary | ICD-10-CM | POA: Diagnosis not present

## 2024-06-05 DIAGNOSIS — R000 Tachycardia, unspecified: Secondary | ICD-10-CM | POA: Diagnosis not present

## 2024-06-05 DIAGNOSIS — J449 Chronic obstructive pulmonary disease, unspecified: Secondary | ICD-10-CM | POA: Diagnosis present

## 2024-06-05 DIAGNOSIS — C799 Secondary malignant neoplasm of unspecified site: Secondary | ICD-10-CM

## 2024-06-05 DIAGNOSIS — Z7189 Other specified counseling: Secondary | ICD-10-CM | POA: Diagnosis not present

## 2024-06-05 DIAGNOSIS — Z7401 Bed confinement status: Secondary | ICD-10-CM | POA: Diagnosis not present

## 2024-06-05 DIAGNOSIS — E872 Acidosis, unspecified: Secondary | ICD-10-CM | POA: Diagnosis not present

## 2024-06-05 DIAGNOSIS — D709 Neutropenia, unspecified: Secondary | ICD-10-CM | POA: Diagnosis not present

## 2024-06-05 DIAGNOSIS — Z789 Other specified health status: Secondary | ICD-10-CM | POA: Diagnosis not present

## 2024-06-05 DIAGNOSIS — I120 Hypertensive chronic kidney disease with stage 5 chronic kidney disease or end stage renal disease: Secondary | ICD-10-CM | POA: Diagnosis not present

## 2024-06-05 DIAGNOSIS — C779 Secondary and unspecified malignant neoplasm of lymph node, unspecified: Secondary | ICD-10-CM | POA: Diagnosis not present

## 2024-06-05 DIAGNOSIS — C50919 Malignant neoplasm of unspecified site of unspecified female breast: Secondary | ICD-10-CM | POA: Diagnosis not present

## 2024-06-05 DIAGNOSIS — R6521 Severe sepsis with septic shock: Secondary | ICD-10-CM | POA: Diagnosis not present

## 2024-06-05 DIAGNOSIS — N186 End stage renal disease: Secondary | ICD-10-CM | POA: Diagnosis not present

## 2024-06-05 DIAGNOSIS — D84821 Immunodeficiency due to drugs: Secondary | ICD-10-CM | POA: Diagnosis present

## 2024-06-05 DIAGNOSIS — E875 Hyperkalemia: Secondary | ICD-10-CM | POA: Diagnosis not present

## 2024-06-05 DIAGNOSIS — C801 Malignant (primary) neoplasm, unspecified: Secondary | ICD-10-CM | POA: Diagnosis not present

## 2024-06-05 LAB — CBC WITH DIFFERENTIAL/PLATELET
Abs Immature Granulocytes: 0.02 K/uL (ref 0.00–0.07)
Basophils Absolute: 0 K/uL (ref 0.0–0.1)
Basophils Relative: 0 %
Eosinophils Absolute: 0.1 K/uL (ref 0.0–0.5)
Eosinophils Relative: 5 %
HCT: 27.2 % — ABNORMAL LOW (ref 36.0–46.0)
Hemoglobin: 8.6 g/dL — ABNORMAL LOW (ref 12.0–15.0)
Immature Granulocytes: 1 %
Lymphocytes Relative: 30 %
Lymphs Abs: 0.5 K/uL — ABNORMAL LOW (ref 0.7–4.0)
MCH: 35.4 pg — ABNORMAL HIGH (ref 26.0–34.0)
MCHC: 31.6 g/dL (ref 30.0–36.0)
MCV: 111.9 fL — ABNORMAL HIGH (ref 80.0–100.0)
Monocytes Absolute: 0.1 K/uL (ref 0.1–1.0)
Monocytes Relative: 7 %
Neutro Abs: 1 K/uL — ABNORMAL LOW (ref 1.7–7.7)
Neutrophils Relative %: 57 %
Platelets: 148 K/uL — ABNORMAL LOW (ref 150–400)
RBC: 2.43 MIL/uL — ABNORMAL LOW (ref 3.87–5.11)
RDW: 17.5 % — ABNORMAL HIGH (ref 11.5–15.5)
Smear Review: NORMAL
WBC: 1.8 K/uL — ABNORMAL LOW (ref 4.0–10.5)
nRBC: 0 % (ref 0.0–0.2)

## 2024-06-05 LAB — COMPREHENSIVE METABOLIC PANEL WITH GFR
ALT: 21 U/L (ref 0–44)
AST: 24 U/L (ref 15–41)
Albumin: 3 g/dL — ABNORMAL LOW (ref 3.5–5.0)
Alkaline Phosphatase: 90 U/L (ref 38–126)
Anion gap: 15 (ref 5–15)
BUN: 53 mg/dL — ABNORMAL HIGH (ref 8–23)
CO2: 28 mmol/L (ref 22–32)
Calcium: 8.6 mg/dL — ABNORMAL LOW (ref 8.9–10.3)
Chloride: 96 mmol/L — ABNORMAL LOW (ref 98–111)
Creatinine, Ser: 7.29 mg/dL — ABNORMAL HIGH (ref 0.44–1.00)
GFR, Estimated: 6 mL/min — ABNORMAL LOW (ref >60)
Glucose, Bld: 129 mg/dL — ABNORMAL HIGH (ref 70–99)
Potassium: 3.7 mmol/L (ref 3.5–5.1)
Sodium: 138 mmol/L (ref 135–145)
Total Bilirubin: 0.3 mg/dL (ref 0.0–1.2)
Total Protein: 5.7 g/dL — ABNORMAL LOW (ref 6.5–8.1)

## 2024-06-05 LAB — PHOSPHORUS: Phosphorus: 7.4 mg/dL — ABNORMAL HIGH (ref 2.5–4.6)

## 2024-06-05 LAB — MAGNESIUM: Magnesium: 2.2 mg/dL (ref 1.7–2.4)

## 2024-06-05 MED ORDER — SODIUM CHLORIDE 0.9 % IV SOLN
7.5000 mg/kg | Freq: Once | INTRAVENOUS | Status: AC
  Administered 2024-06-05: 600 mg via INTRAVENOUS
  Filled 2024-06-05: qty 60

## 2024-06-05 MED ORDER — ATROPINE SULFATE 1 MG/ML IV SOLN
0.5000 mg | Freq: Once | INTRAVENOUS | Status: AC
  Administered 2024-06-05: 0.5 mg via INTRAVENOUS
  Filled 2024-06-05: qty 1

## 2024-06-05 MED ORDER — APREPITANT 130 MG/18ML IV EMUL
130.0000 mg | Freq: Once | INTRAVENOUS | Status: AC
  Administered 2024-06-05: 130 mg via INTRAVENOUS
  Filled 2024-06-05: qty 18

## 2024-06-05 MED ORDER — CETIRIZINE HCL 10 MG/ML IV SOLN
10.0000 mg | Freq: Once | INTRAVENOUS | Status: AC
  Administered 2024-06-05: 10 mg via INTRAVENOUS
  Filled 2024-06-05: qty 1

## 2024-06-05 MED ORDER — SODIUM CHLORIDE 0.9 % IV SOLN: INTRAVENOUS | Status: DC

## 2024-06-05 MED ORDER — DIPHENHYDRAMINE HCL 50 MG/ML IJ SOLN: 50.0000 mg | Freq: Once | INTRAMUSCULAR | Status: DC

## 2024-06-05 MED ORDER — PALONOSETRON HCL INJECTION 0.25 MG/5ML
0.2500 mg | Freq: Once | INTRAVENOUS | Status: AC
  Administered 2024-06-05: 0.25 mg via INTRAVENOUS
  Filled 2024-06-05: qty 5

## 2024-06-05 MED ORDER — FENTANYL 50 MCG/HR TD PT72: 1.0000 | MEDICATED_PATCH | TRANSDERMAL | 0 refills | Status: DC

## 2024-06-05 MED ORDER — FLUCONAZOLE 100 MG PO TABS: 100.0000 mg | ORAL_TABLET | ORAL | 0 refills | Status: DC

## 2024-06-05 MED ORDER — FAMOTIDINE IN NACL 20-0.9 MG/50ML-% IV SOLN
20.0000 mg | Freq: Once | INTRAVENOUS | Status: AC
  Administered 2024-06-05: 20 mg via INTRAVENOUS
  Filled 2024-06-05: qty 50

## 2024-06-05 MED ORDER — DEXAMETHASONE SOD PHOSPHATE PF 10 MG/ML IJ SOLN
10.0000 mg | Freq: Once | INTRAMUSCULAR | Status: AC
  Administered 2024-06-05: 10 mg via INTRAVENOUS

## 2024-06-05 MED ORDER — ACETAMINOPHEN 325 MG PO TABS
650.0000 mg | ORAL_TABLET | Freq: Once | ORAL | Status: AC
  Administered 2024-06-05: 650 mg via ORAL
  Filled 2024-06-05: qty 2

## 2024-06-05 NOTE — Progress Notes (Unsigned)
 Pt and patient's husband expresses to RN  that they are really concern about her being disorientated that has been going on since her last treatment. Her O2 was 84% on 2L when she came in today she has been on 2 L at home. Rn increased her to 3L and she is now at 100%. She has been having diarrhea for three- four days and immodium is not working. Also, she has a really sore throat that is effecting her eating and she says nothing is giving her relief. MD and treatment team made aware. Per MD she will come and see patient at chairside.    Kiyon Fidalgo

## 2024-06-05 NOTE — Progress Notes (Signed)
 Patient stated she has been really disorientated that has been going on since her last treatment. Oxygen  sat 84%  today and she has been on 2 L at home. She is currently on 3L in the clinic and she is at 100%. She has been having diarrhea for three- four days and immodium is not working. She has a really sore throat that is effecting her eating and she stated nothing is helping her for relief.  Dr. Davonna notified and waiting for orders.   Labs reviewed with verbal order ok to treat.  Patient to receive NS 500 ml bolus.    Patient tolerated chemotherapy with no complaints voiced.  Side effects with management reviewed with understanding verbalized.  Port site clean and dry with no bruising or swelling noted at site.  Good blood return noted before and after administration of chemotherapy.  Band aid applied.  Patient left in satisfactory condition with VSS and no s/s of distress noted.

## 2024-06-05 NOTE — Progress Notes (Signed)
 OK to treat with ANC 1000  Reduce dose Trodelvy to 7.5 mg/kg  Add day 9 GSCF 6 mg subcutaneous x 1  NS 500 ml bolus x 1  Discontinue Benadryl  as premedication   Add Cetirizine 10 mg IVpush x 1 as premedication.  V.O. Dr Ivery Joshua Hibbs

## 2024-06-05 NOTE — Patient Instructions (Signed)
 [facility] - A DEPT OF [facility]  Discharge Instructions: Thank you for choosing [facility] to provide your oncology and hematology care.  If you have a lab appointment with the Cancer Center - please note that after April 8th, 2024, all labs will be drawn in the cancer center.  You do not have to check in or register with the main entrance as you have in the past but will complete your check-in in the cancer center.  Wear comfortable clothing and clothing appropriate for easy access to any Portacath or PICC line.   We strive to give you quality time with your provider. You may need to reschedule your appointment if you arrive late (15 or more minutes).  Arriving late affects you and other patients whose appointments are after yours.  Also, if you miss three or more appointments without notifying the office, you may be dismissed from the clinic at the provider's discretion.      For prescription refill requests, have your pharmacy contact our office and allow 72 hours for refills to be completed.    Today you received the following chemotherapy and/or immunotherapy agents trodelvy .       To help prevent nausea and vomiting after your treatment, we encourage you to take your nausea medication as directed.  BELOW ARE SYMPTOMS THAT SHOULD BE REPORTED IMMEDIATELY: *FEVER GREATER THAN 100.4 F (38 C) OR HIGHER *CHILLS OR SWEATING *NAUSEA AND VOMITING THAT IS NOT CONTROLLED WITH YOUR NAUSEA MEDICATION *UNUSUAL SHORTNESS OF BREATH *UNUSUAL BRUISING OR BLEEDING *URINARY PROBLEMS (pain or burning when urinating, or frequent urination) *BOWEL PROBLEMS (unusual diarrhea, constipation, pain near the anus) TENDERNESS IN MOUTH AND THROAT WITH OR WITHOUT PRESENCE OF ULCERS (sore throat, sores in mouth, or a toothache) UNUSUAL RASH, SWELLING OR PAIN  UNUSUAL VAGINAL DISCHARGE OR ITCHING   Items with * indicate a potential emergency and should be followed  up as soon as possible or go to the Emergency Department if any problems should occur.  Please show the CHEMOTHERAPY ALERT CARD or IMMUNOTHERAPY ALERT CARD at check-in to the Emergency Department and triage nurse.  Should you have questions after your visit or need to cancel or reschedule your appointment, please contact [facility] [place] - A DEPT OF [name] [phone]  and follow the prompts.  Office hours are 8:00 a.m. to 4:30 p.m. Monday - Friday. Please note that voicemails left after 4:00 p.m. may not be returned until the following business day.  We are closed weekends and major holidays. You have access to a nurse at all times for urgent questions. Please call the main number to the clinic [phone] and follow the prompts.  For any non-urgent questions, you may also contact your provider using MyChart. We now offer e-Visits for anyone 84 and older to request care online for non-urgent symptoms. For details visit [email or web address].   Also download the MyChart app! Go to the app store, search MyChart, open the app, select [place], and log in with your MyChart username and password.

## 2024-06-06 ENCOUNTER — Inpatient Hospital Stay

## 2024-06-06 ENCOUNTER — Encounter: Payer: Self-pay | Admitting: Oncology

## 2024-06-06 VITALS — BP 112/68 | HR 96 | Temp 96.2°F | Resp 20

## 2024-06-06 DIAGNOSIS — Z992 Dependence on renal dialysis: Secondary | ICD-10-CM | POA: Diagnosis not present

## 2024-06-06 DIAGNOSIS — D631 Anemia in chronic kidney disease: Secondary | ICD-10-CM | POA: Diagnosis not present

## 2024-06-06 DIAGNOSIS — N186 End stage renal disease: Secondary | ICD-10-CM | POA: Diagnosis not present

## 2024-06-06 DIAGNOSIS — C50911 Malignant neoplasm of unspecified site of right female breast: Secondary | ICD-10-CM

## 2024-06-06 DIAGNOSIS — E1129 Type 2 diabetes mellitus with other diabetic kidney complication: Secondary | ICD-10-CM | POA: Diagnosis not present

## 2024-06-06 DIAGNOSIS — N2581 Secondary hyperparathyroidism of renal origin: Secondary | ICD-10-CM | POA: Diagnosis not present

## 2024-06-06 DIAGNOSIS — D509 Iron deficiency anemia, unspecified: Secondary | ICD-10-CM | POA: Diagnosis not present

## 2024-06-06 MED ORDER — PEGFILGRASTIM-FPGK 6 MG/0.6ML ~~LOC~~ SOSY
6.0000 mg | PREFILLED_SYRINGE | Freq: Once | SUBCUTANEOUS | Status: AC
  Administered 2024-06-06: 6 mg via SUBCUTANEOUS
  Filled 2024-06-06: qty 0.6

## 2024-06-06 NOTE — Patient Instructions (Addendum)
 [facility] - A DEPT OF [facility]  Discharge Instructions: Thank you for choosing [facility] to provide your oncology and hematology care.  If you have a lab appointment with the Cancer Center - please note that after April 8th, 2024, all labs will be drawn in the cancer center.  You do not have to check in or register with the main entrance as you have in the past but will complete your check-in in the cancer center.  Wear comfortable clothing and clothing appropriate for easy access to any Portacath or PICC line.   We strive to give you quality time with your provider. You may need to reschedule your appointment if you arrive late (15 or more minutes).  Arriving late affects you and other patients whose appointments are after yours.  Also, if you miss three or more appointments without notifying the office, you may be dismissed from the clinic at the provider's discretion.      For prescription refill requests, have your pharmacy contact our office and allow 72 hours for refills to be completed.    Today you received the following injection: stimufend      To help prevent nausea and vomiting after your treatment, we encourage you to take your nausea medication as directed.  BELOW ARE SYMPTOMS THAT SHOULD BE REPORTED IMMEDIATELY: *FEVER GREATER THAN 100.4 F (38 C) OR HIGHER *CHILLS OR SWEATING *NAUSEA AND VOMITING THAT IS NOT CONTROLLED WITH YOUR NAUSEA MEDICATION *UNUSUAL SHORTNESS OF BREATH *UNUSUAL BRUISING OR BLEEDING *URINARY PROBLEMS (pain or burning when urinating, or frequent urination) *BOWEL PROBLEMS (unusual diarrhea, constipation, pain near the anus) TENDERNESS IN MOUTH AND THROAT WITH OR WITHOUT PRESENCE OF ULCERS (sore throat, sores in mouth, or a toothache) UNUSUAL RASH, SWELLING OR PAIN  UNUSUAL VAGINAL DISCHARGE OR ITCHING   Items with * indicate a potential emergency and should be followed up as soon as possible or go to  the Emergency Department if any problems should occur.  Please show the CHEMOTHERAPY ALERT CARD or IMMUNOTHERAPY ALERT CARD at check-in to the Emergency Department and triage nurse.  Should you have questions after your visit or need to cancel or reschedule your appointment, please contact [facility] [place] - A DEPT OF [facility] [phone]  and follow the prompts.  Office hours are 8:00 a.m. to 4:30 p.m. Monday - Friday. Please note that voicemails left after 4:00 p.m. may not be returned until the following business day.  We are closed weekends and major holidays. You have access to a nurse at all times for urgent questions. Please call the main number to the clinic [phone] and follow the prompts.  For any non-urgent questions, you may also contact your provider using MyChart. We now offer e-Visits for anyone 19 and older to request care online for non-urgent symptoms. For details visit [email or web address].   Also download the MyChart app! Go to the app store, search "MyChart", open the app, select [place], and log in with your MyChart username and password.

## 2024-06-06 NOTE — Progress Notes (Signed)
 Stimufend injection given per orders. Patient tolerated it well without problems. Vitals stable and discharged home from clinic via wheelchair. Follow up as scheduled.

## 2024-06-08 ENCOUNTER — Encounter: Payer: Self-pay | Admitting: Oncology

## 2024-06-08 ENCOUNTER — Encounter (HOSPITAL_COMMUNITY): Payer: Self-pay | Admitting: Emergency Medicine

## 2024-06-08 ENCOUNTER — Emergency Department (HOSPITAL_COMMUNITY)

## 2024-06-08 ENCOUNTER — Other Ambulatory Visit: Payer: Self-pay

## 2024-06-08 ENCOUNTER — Inpatient Hospital Stay (HOSPITAL_COMMUNITY)

## 2024-06-08 ENCOUNTER — Inpatient Hospital Stay (HOSPITAL_COMMUNITY)
Admission: EM | Admit: 2024-06-08 | Discharge: 2024-06-11 | DRG: 871 | Disposition: A | Discharge status: Hospice | Attending: Internal Medicine | Admitting: Internal Medicine

## 2024-06-08 DIAGNOSIS — R6521 Severe sepsis with septic shock: Secondary | ICD-10-CM | POA: Diagnosis present

## 2024-06-08 DIAGNOSIS — R627 Adult failure to thrive: Secondary | ICD-10-CM | POA: Diagnosis present

## 2024-06-08 DIAGNOSIS — Z888 Allergy status to other drugs, medicaments and biological substances status: Secondary | ICD-10-CM

## 2024-06-08 DIAGNOSIS — E872 Acidosis, unspecified: Secondary | ICD-10-CM | POA: Diagnosis not present

## 2024-06-08 DIAGNOSIS — K6289 Other specified diseases of anus and rectum: Secondary | ICD-10-CM | POA: Diagnosis not present

## 2024-06-08 DIAGNOSIS — N2581 Secondary hyperparathyroidism of renal origin: Secondary | ICD-10-CM | POA: Diagnosis present

## 2024-06-08 DIAGNOSIS — Z515 Encounter for palliative care: Secondary | ICD-10-CM | POA: Diagnosis not present

## 2024-06-08 DIAGNOSIS — Z862 Personal history of diseases of the blood and blood-forming organs and certain disorders involving the immune mechanism: Secondary | ICD-10-CM

## 2024-06-08 DIAGNOSIS — K219 Gastro-esophageal reflux disease without esophagitis: Secondary | ICD-10-CM | POA: Diagnosis present

## 2024-06-08 DIAGNOSIS — C50919 Malignant neoplasm of unspecified site of unspecified female breast: Secondary | ICD-10-CM | POA: Diagnosis present

## 2024-06-08 DIAGNOSIS — C787 Secondary malignant neoplasm of liver and intrahepatic bile duct: Secondary | ICD-10-CM | POA: Diagnosis not present

## 2024-06-08 DIAGNOSIS — I5032 Chronic diastolic (congestive) heart failure: Secondary | ICD-10-CM | POA: Diagnosis present

## 2024-06-08 DIAGNOSIS — J449 Chronic obstructive pulmonary disease, unspecified: Secondary | ICD-10-CM | POA: Diagnosis present

## 2024-06-08 DIAGNOSIS — D631 Anemia in chronic kidney disease: Secondary | ICD-10-CM | POA: Diagnosis present

## 2024-06-08 DIAGNOSIS — C801 Malignant (primary) neoplasm, unspecified: Secondary | ICD-10-CM | POA: Diagnosis not present

## 2024-06-08 DIAGNOSIS — R531 Weakness: Secondary | ICD-10-CM

## 2024-06-08 DIAGNOSIS — I10 Essential (primary) hypertension: Secondary | ICD-10-CM | POA: Diagnosis not present

## 2024-06-08 DIAGNOSIS — A419 Sepsis, unspecified organism: Secondary | ICD-10-CM | POA: Diagnosis present

## 2024-06-08 DIAGNOSIS — Z8249 Family history of ischemic heart disease and other diseases of the circulatory system: Secondary | ICD-10-CM

## 2024-06-08 DIAGNOSIS — Z9981 Dependence on supplemental oxygen: Secondary | ICD-10-CM

## 2024-06-08 DIAGNOSIS — I132 Hypertensive heart and chronic kidney disease with heart failure and with stage 5 chronic kidney disease, or end stage renal disease: Secondary | ICD-10-CM | POA: Diagnosis present

## 2024-06-08 DIAGNOSIS — C773 Secondary and unspecified malignant neoplasm of axilla and upper limb lymph nodes: Secondary | ICD-10-CM | POA: Diagnosis present

## 2024-06-08 DIAGNOSIS — I959 Hypotension, unspecified: Secondary | ICD-10-CM | POA: Diagnosis not present

## 2024-06-08 DIAGNOSIS — E66811 Obesity, class 1: Secondary | ICD-10-CM | POA: Diagnosis present

## 2024-06-08 DIAGNOSIS — R197 Diarrhea, unspecified: Secondary | ICD-10-CM | POA: Diagnosis not present

## 2024-06-08 DIAGNOSIS — D61810 Antineoplastic chemotherapy induced pancytopenia: Secondary | ICD-10-CM | POA: Diagnosis present

## 2024-06-08 DIAGNOSIS — Z800 Family history of malignant neoplasm of digestive organs: Secondary | ICD-10-CM

## 2024-06-08 DIAGNOSIS — K648 Other hemorrhoids: Secondary | ICD-10-CM | POA: Diagnosis present

## 2024-06-08 DIAGNOSIS — R5081 Fever presenting with conditions classified elsewhere: Secondary | ICD-10-CM | POA: Diagnosis not present

## 2024-06-08 DIAGNOSIS — J9611 Chronic respiratory failure with hypoxia: Secondary | ICD-10-CM | POA: Diagnosis present

## 2024-06-08 DIAGNOSIS — Z9049 Acquired absence of other specified parts of digestive tract: Secondary | ICD-10-CM

## 2024-06-08 DIAGNOSIS — I2510 Atherosclerotic heart disease of native coronary artery without angina pectoris: Secondary | ICD-10-CM | POA: Diagnosis present

## 2024-06-08 DIAGNOSIS — E782 Mixed hyperlipidemia: Secondary | ICD-10-CM | POA: Diagnosis present

## 2024-06-08 DIAGNOSIS — Z6831 Body mass index (BMI) 31.0-31.9, adult: Secondary | ICD-10-CM

## 2024-06-08 DIAGNOSIS — Z17421 Hormone receptor negative with human epidermal growth factor receptor 2 negative status: Secondary | ICD-10-CM

## 2024-06-08 DIAGNOSIS — Z853 Personal history of malignant neoplasm of breast: Secondary | ICD-10-CM

## 2024-06-08 DIAGNOSIS — Z66 Do not resuscitate: Secondary | ICD-10-CM | POA: Diagnosis not present

## 2024-06-08 DIAGNOSIS — C7800 Secondary malignant neoplasm of unspecified lung: Secondary | ICD-10-CM | POA: Diagnosis present

## 2024-06-08 DIAGNOSIS — R4589 Other symptoms and signs involving emotional state: Secondary | ICD-10-CM | POA: Diagnosis not present

## 2024-06-08 DIAGNOSIS — T451X5A Adverse effect of antineoplastic and immunosuppressive drugs, initial encounter: Secondary | ICD-10-CM | POA: Diagnosis present

## 2024-06-08 DIAGNOSIS — E1122 Type 2 diabetes mellitus with diabetic chronic kidney disease: Secondary | ICD-10-CM | POA: Diagnosis present

## 2024-06-08 DIAGNOSIS — Z7982 Long term (current) use of aspirin: Secondary | ICD-10-CM

## 2024-06-08 DIAGNOSIS — E875 Hyperkalemia: Secondary | ICD-10-CM | POA: Diagnosis present

## 2024-06-08 DIAGNOSIS — N186 End stage renal disease: Secondary | ICD-10-CM | POA: Diagnosis present

## 2024-06-08 DIAGNOSIS — C779 Secondary and unspecified malignant neoplasm of lymph node, unspecified: Secondary | ICD-10-CM | POA: Diagnosis not present

## 2024-06-08 DIAGNOSIS — Z992 Dependence on renal dialysis: Secondary | ICD-10-CM

## 2024-06-08 DIAGNOSIS — Z833 Family history of diabetes mellitus: Secondary | ICD-10-CM

## 2024-06-08 DIAGNOSIS — Z90710 Acquired absence of both cervix and uterus: Secondary | ICD-10-CM

## 2024-06-08 DIAGNOSIS — Z7189 Other specified counseling: Secondary | ICD-10-CM | POA: Diagnosis not present

## 2024-06-08 DIAGNOSIS — E860 Dehydration: Secondary | ICD-10-CM | POA: Diagnosis present

## 2024-06-08 DIAGNOSIS — D84821 Immunodeficiency due to drugs: Secondary | ICD-10-CM | POA: Diagnosis present

## 2024-06-08 DIAGNOSIS — Z79899 Other long term (current) drug therapy: Secondary | ICD-10-CM

## 2024-06-08 DIAGNOSIS — D709 Neutropenia, unspecified: Secondary | ICD-10-CM | POA: Diagnosis present

## 2024-06-08 DIAGNOSIS — K644 Residual hemorrhoidal skin tags: Secondary | ICD-10-CM | POA: Diagnosis present

## 2024-06-08 DIAGNOSIS — E8809 Other disorders of plasma-protein metabolism, not elsewhere classified: Secondary | ICD-10-CM | POA: Diagnosis present

## 2024-06-08 DIAGNOSIS — Z789 Other specified health status: Secondary | ICD-10-CM | POA: Diagnosis not present

## 2024-06-08 DIAGNOSIS — R1084 Generalized abdominal pain: Secondary | ICD-10-CM | POA: Diagnosis not present

## 2024-06-08 DIAGNOSIS — D539 Nutritional anemia, unspecified: Secondary | ICD-10-CM | POA: Diagnosis present

## 2024-06-08 DIAGNOSIS — Z9011 Acquired absence of right breast and nipple: Secondary | ICD-10-CM

## 2024-06-08 DIAGNOSIS — Z886 Allergy status to analgesic agent status: Secondary | ICD-10-CM

## 2024-06-08 LAB — CBC WITH DIFFERENTIAL/PLATELET
Abs Immature Granulocytes: 0.03 K/uL (ref 0.00–0.07)
Basophils Absolute: 0 K/uL (ref 0.0–0.1)
Basophils Absolute: 0 K/uL (ref 0.0–0.1)
Basophils Relative: 0 %
Basophils Relative: 0 %
Eosinophils Absolute: 0.2 K/uL (ref 0.0–0.5)
Eosinophils Absolute: 0 K/uL (ref 0.0–0.5)
Eosinophils Relative: 25 %
Eosinophils Relative: 0 %
HCT: 28.2 % — ABNORMAL LOW (ref 36.0–46.0)
HCT: 26.1 % — ABNORMAL LOW (ref 36.0–46.0)
Hemoglobin: 8.8 g/dL — ABNORMAL LOW (ref 12.0–15.0)
Hemoglobin: 8 g/dL — ABNORMAL LOW (ref 12.0–15.0)
Immature Granulocytes: 4 %
Lymphocytes Relative: 34 %
Lymphocytes Relative: 41 %
Lymphs Abs: 0.3 K/uL — ABNORMAL LOW (ref 0.7–4.0)
Lymphs Abs: 0.1 K/uL — ABNORMAL LOW (ref 0.7–4.0)
MCH: 35.3 pg — ABNORMAL HIGH (ref 26.0–34.0)
MCH: 35.4 pg — ABNORMAL HIGH (ref 26.0–34.0)
MCHC: 31.2 g/dL (ref 30.0–36.0)
MCHC: 30.7 g/dL (ref 30.0–36.0)
MCV: 113.3 fL — ABNORMAL HIGH (ref 80.0–100.0)
MCV: 115.5 fL — ABNORMAL HIGH (ref 80.0–100.0)
Monocytes Absolute: 0.1 K/uL (ref 0.1–1.0)
Monocytes Absolute: 0 K/uL — ABNORMAL LOW (ref 0.1–1.0)
Monocytes Relative: 7 %
Monocytes Relative: 5 %
Neutro Abs: 0.2 K/uL — CL (ref 1.7–7.7)
Neutro Abs: 0.2 K/uL — CL (ref 1.7–7.7)
Neutrophils Relative %: 30 %
Neutrophils Relative %: 54 %
Platelets: 129 K/uL — ABNORMAL LOW (ref 150–400)
Platelets: 116 K/uL — ABNORMAL LOW (ref 150–400)
RBC: 2.49 MIL/uL — ABNORMAL LOW (ref 3.87–5.11)
RBC: 2.26 MIL/uL — ABNORMAL LOW (ref 3.87–5.11)
RDW: 17 % — ABNORMAL HIGH (ref 11.5–15.5)
RDW: 17.1 % — ABNORMAL HIGH (ref 11.5–15.5)
Smear Review: NORMAL
WBC: 0.8 K/uL — CL (ref 4.0–10.5)
WBC: 0.3 K/uL — CL (ref 4.0–10.5)
nRBC: 0 % (ref 0.0–0.2)
nRBC: 0 % (ref 0.0–0.2)

## 2024-06-08 LAB — COMPREHENSIVE METABOLIC PANEL WITH GFR
ALT: 19 U/L (ref 0–44)
AST: 21 U/L (ref 15–41)
Albumin: 3 g/dL — ABNORMAL LOW (ref 3.5–5.0)
Alkaline Phosphatase: 83 U/L (ref 38–126)
Anion gap: 20 — ABNORMAL HIGH (ref 5–15)
BUN: 75 mg/dL — ABNORMAL HIGH (ref 8–23)
CO2: 24 mmol/L (ref 22–32)
Calcium: 8.2 mg/dL — ABNORMAL LOW (ref 8.9–10.3)
Chloride: 96 mmol/L — ABNORMAL LOW (ref 98–111)
Creatinine, Ser: 8.97 mg/dL — ABNORMAL HIGH (ref 0.44–1.00)
GFR, Estimated: 5 mL/min — ABNORMAL LOW (ref >60)
Glucose, Bld: 135 mg/dL — ABNORMAL HIGH (ref 70–99)
Potassium: 4.4 mmol/L (ref 3.5–5.1)
Sodium: 140 mmol/L (ref 135–145)
Total Bilirubin: 0.3 mg/dL (ref 0.0–1.2)
Total Protein: 5.6 g/dL — ABNORMAL LOW (ref 6.5–8.1)

## 2024-06-08 LAB — APTT: aPTT: 22 s — ABNORMAL LOW (ref 24–36)

## 2024-06-08 LAB — LACTIC ACID, PLASMA: Lactic Acid, Venous: 1.9 mmol/L (ref 0.5–1.9)

## 2024-06-08 LAB — PHOSPHORUS: Phosphorus: 10.3 mg/dL — ABNORMAL HIGH (ref 2.5–4.6)

## 2024-06-08 LAB — LIPASE, BLOOD: Lipase: 24 U/L (ref 11–51)

## 2024-06-08 LAB — PROTIME-INR
INR: 1.1 (ref 0.8–1.2)
Prothrombin Time: 15.2 s (ref 11.4–15.2)

## 2024-06-08 LAB — VITAMIN B12: Vitamin B-12: 411 pg/mL (ref 180–914)

## 2024-06-08 LAB — FOLATE: Folate: 15.9 ng/mL (ref >5.9)

## 2024-06-08 LAB — MAGNESIUM: Magnesium: 2.5 mg/dL — ABNORMAL HIGH (ref 1.7–2.4)

## 2024-06-08 MED ORDER — ONDANSETRON HCL 4 MG/2ML IJ SOLN
4.0000 mg | Freq: Once | INTRAMUSCULAR | Status: AC
  Administered 2024-06-08: 4 mg via INTRAVENOUS
  Filled 2024-06-08: qty 2

## 2024-06-08 MED ORDER — VANCOMYCIN HCL 750 MG/150ML IV SOLN: 750.0000 mg | INTRAVENOUS | Status: DC

## 2024-06-08 MED ORDER — VANCOMYCIN HCL 1500 MG/300ML IV SOLN
1500.0000 mg | Freq: Once | INTRAVENOUS | Status: AC
  Administered 2024-06-08: 1500 mg via INTRAVENOUS
  Filled 2024-06-08: qty 300

## 2024-06-08 MED ORDER — LIDOCAINE 4 % EX CREA
TOPICAL_CREAM | Freq: Four times a day (QID) | CUTANEOUS | Status: DC | PRN
  Administered 2024-06-08: 1 via TOPICAL
  Filled 2024-06-08: qty 5

## 2024-06-08 MED ORDER — SODIUM CHLORIDE 0.9 % IV BOLUS
500.0000 mL | Freq: Once | INTRAVENOUS | Status: AC
  Administered 2024-06-08: 500 mL via INTRAVENOUS

## 2024-06-08 MED ORDER — ASPIRIN 81 MG PO TBEC
81.0000 mg | DELAYED_RELEASE_TABLET | Freq: Every day | ORAL | Status: DC
  Administered 2024-06-09 – 2024-06-11 (×3): 81 mg via ORAL
  Filled 2024-06-08 (×4): qty 1

## 2024-06-08 MED ORDER — CINACALCET HCL 30 MG PO TABS
90.0000 mg | ORAL_TABLET | Freq: Every evening | ORAL | Status: DC
  Administered 2024-06-10: 90 mg via ORAL
  Filled 2024-06-08 (×2): qty 3

## 2024-06-08 MED ORDER — ACETAMINOPHEN 650 MG RE SUPP
650.0000 mg | Freq: Four times a day (QID) | RECTAL | Status: DC | PRN
  Administered 2024-06-08 (×2): 650 mg via RECTAL
  Filled 2024-06-08 (×2): qty 1

## 2024-06-08 MED ORDER — ALBUTEROL SULFATE (2.5 MG/3ML) 0.083% IN NEBU
2.5000 mg | INHALATION_SOLUTION | RESPIRATORY_TRACT | Status: DC | PRN
  Administered 2024-06-08 – 2024-06-09 (×2): 2.5 mg via RESPIRATORY_TRACT
  Filled 2024-06-08 (×2): qty 3

## 2024-06-08 MED ORDER — IOHEXOL 300 MG/ML  SOLN
100.0000 mL | Freq: Once | INTRAMUSCULAR | Status: AC | PRN
  Administered 2024-06-08: 100 mL via INTRAVENOUS

## 2024-06-08 MED ORDER — LIDOCAINE HCL URETHRAL/MUCOSAL 2 % EX GEL
1.0000 | CUTANEOUS | Status: DC | PRN
  Administered 2024-06-08: 1 via TOPICAL
  Filled 2024-06-08: qty 5

## 2024-06-08 MED ORDER — VANCOMYCIN HCL IN DEXTROSE 1-5 GM/200ML-% IV SOLN: 1000.0000 mg | Freq: Once | INTRAVENOUS | Status: DC

## 2024-06-08 MED ORDER — LACTATED RINGERS IV BOLUS
500.0000 mL | Freq: Once | INTRAVENOUS | Status: AC
  Administered 2024-06-08: 500 mL via INTRAVENOUS

## 2024-06-08 MED ORDER — METOPROLOL TARTRATE 5 MG/5ML IV SOLN
2.5000 mg | Freq: Once | INTRAVENOUS | Status: AC
  Administered 2024-06-08: 2.5 mg via INTRAVENOUS
  Filled 2024-06-08: qty 5

## 2024-06-08 MED ORDER — WITCH HAZEL-GLYCERIN EX PADS: MEDICATED_PAD | CUTANEOUS | Status: DC | PRN

## 2024-06-08 MED ORDER — LOPERAMIDE HCL 2 MG PO CAPS
2.0000 mg | ORAL_CAPSULE | ORAL | Status: DC | PRN
  Administered 2024-06-09 – 2024-06-11 (×4): 2 mg via ORAL
  Filled 2024-06-08 (×4): qty 1

## 2024-06-08 MED ORDER — ENSURE PLUS HIGH PROTEIN PO LIQD
237.0000 mL | Freq: Two times a day (BID) | ORAL | Status: DC
  Administered 2024-06-09 – 2024-06-10 (×3): 237 mL via ORAL
  Filled 2024-06-08 (×2): qty 237

## 2024-06-08 MED ORDER — HYDROCORTISONE (PERIANAL) 2.5 % EX CREA
TOPICAL_CREAM | Freq: Four times a day (QID) | CUTANEOUS | Status: DC
  Administered 2024-06-10: 1 via RECTAL
  Filled 2024-06-08 (×2): qty 28.35

## 2024-06-08 MED ORDER — SODIUM CHLORIDE 0.9 % IV SOLN
2.0000 g | Freq: Once | INTRAVENOUS | Status: AC
  Administered 2024-06-08: 2 g via INTRAVENOUS
  Filled 2024-06-08: qty 12.5

## 2024-06-08 MED ORDER — FENTANYL 50 MCG/HR TD PT72
1.0000 | MEDICATED_PATCH | TRANSDERMAL | Status: DC
Start: 2024-06-08 — End: 2024-06-11
  Administered 2024-06-08 – 2024-06-11 (×2): 1 via TRANSDERMAL
  Filled 2024-06-08 (×2): qty 1

## 2024-06-08 MED ORDER — HEPARIN SODIUM (PORCINE) 5000 UNIT/ML IJ SOLN
5000.0000 [IU] | Freq: Three times a day (TID) | INTRAMUSCULAR | Status: DC
  Administered 2024-06-08 – 2024-06-11 (×9): 5000 [IU] via SUBCUTANEOUS
  Filled 2024-06-08 (×9): qty 1

## 2024-06-08 MED ORDER — LACTATED RINGERS IV BOLUS
250.0000 mL | Freq: Once | INTRAVENOUS | Status: AC
  Administered 2024-06-08: 250 mL via INTRAVENOUS

## 2024-06-08 MED ORDER — SODIUM CHLORIDE 0.9 % IV SOLN: 2.0000 g | INTRAVENOUS | Status: DC

## 2024-06-08 MED ORDER — LACTATED RINGERS IV BOLUS: 500.0000 mL | Freq: Once | INTRAVENOUS | Status: DC

## 2024-06-08 MED ORDER — ATORVASTATIN CALCIUM 20 MG PO TABS
20.0000 mg | ORAL_TABLET | Freq: Every day | ORAL | Status: DC
  Administered 2024-06-09: 20 mg via ORAL
  Filled 2024-06-08 (×2): qty 1

## 2024-06-08 MED ORDER — METRONIDAZOLE 500 MG/100ML IV SOLN
500.0000 mg | Freq: Two times a day (BID) | INTRAVENOUS | Status: DC
  Administered 2024-06-08 – 2024-06-09 (×3): 500 mg via INTRAVENOUS
  Filled 2024-06-08 (×3): qty 100

## 2024-06-08 MED ORDER — HYDROMORPHONE HCL 1 MG/ML IJ SOLN
1.0000 mg | Freq: Once | INTRAMUSCULAR | Status: AC
  Administered 2024-06-08: 1 mg via INTRAVENOUS
  Filled 2024-06-08: qty 1

## 2024-06-08 MED ORDER — ACETAMINOPHEN 325 MG PO TABS
650.0000 mg | ORAL_TABLET | Freq: Four times a day (QID) | ORAL | Status: DC | PRN
  Administered 2024-06-09: 650 mg via ORAL
  Filled 2024-06-08 (×2): qty 2

## 2024-06-08 MED ORDER — PANTOPRAZOLE SODIUM 40 MG PO TBEC
40.0000 mg | DELAYED_RELEASE_TABLET | Freq: Two times a day (BID) | ORAL | Status: DC
  Filled 2024-06-08 (×2): qty 1

## 2024-06-08 MED ORDER — CHLORHEXIDINE GLUCONATE CLOTH 2 % EX PADS
6.0000 | MEDICATED_PAD | Freq: Every day | CUTANEOUS | Status: DC
  Administered 2024-06-08 – 2024-06-10 (×3): 6 via TOPICAL

## 2024-06-08 MED ORDER — ONDANSETRON HCL 4 MG/2ML IJ SOLN: 4.0000 mg | Freq: Four times a day (QID) | INTRAMUSCULAR | Status: DC | PRN

## 2024-06-08 MED ORDER — HYDROMORPHONE HCL 1 MG/ML IJ SOLN: 0.5000 mg | INTRAMUSCULAR | Status: DC | PRN

## 2024-06-08 MED ORDER — ONDANSETRON HCL 4 MG PO TABS: 4.0000 mg | ORAL_TABLET | Freq: Four times a day (QID) | ORAL | Status: DC | PRN

## 2024-06-08 NOTE — Progress Notes (Signed)
   06/08/24 0916  Vitals  Temp (!) 102.2 F (39 C)  Temp Source Rectal  BP (!) 95/53  MAP (mmHg) 65  BP Location Right Arm  BP Method Automatic  Patient Position (if appropriate) Lying  Pulse Rate (!) 111  Pulse Rate Source Monitor  Resp 20  Level of Consciousness  Level of Consciousness Alert  Oxygen  Therapy  SpO2 95 %  O2 Device Nasal Cannula  O2 Flow Rate (L/min) 2 L/min   Notified by NT that pt's temp 101.2 axillary and SaO2 85% on 2 lpm [place]. In to evaluate pt, awake, grimacing with movement. No resp difficulty noted, lung sounds remain diminished bilateral bases. Rectal temp obtained at 102.2. Pt given rectal tylenol  supp and lidocaine  topical cream to hemorrhoids per order. Pt turned and repositioned, SaO2 95% at present. Husband and sister updated on plan of care, questions answered. MD Vicci notified of pt's temp, B/P and current condition.

## 2024-06-08 NOTE — Plan of Care (Signed)
 This patient remains on AP-ICCU as of time of writing. The patien tis spontaneously awake and alert, following commands, slow and inconsistent verbal responses; no focal neuromuscular motor deficit appreciated. The patient is drowsy such that attempts for PO intake would be unsafe. The patient is febrile overnight and has received PR APAP per orders. Port-a-cath to right chest, not accessed thus far this admission per the family's request. AVF to LUE with non-pitting edema throughout LUE; extremity elevated. No sticks/BP to LUE and pink armband is in place. Single PIV access to RUE is the only venous access at the moment. No current active infusions. ST per telemetry. Diarrhea ongoing overnight as well. Fentanyl  patch to right shoulder exchanged overnight; site not rotated per the family's request. Protective precautions and appropriate signage are in place (due to neutropenia). Plan for HD tomorrow.  Problem: Education: Goal: Knowledge of General Education information will improve Description: Including pain rating scale, medication(s)/side effects and non-pharmacologic comfort measures Outcome: Not Progressing   Problem: Health Behavior/Discharge Planning: Goal: Ability to manage health-related needs will improve Outcome: Not Progressing   Problem: Clinical Measurements: Goal: Ability to maintain clinical measurements within normal limits will improve Outcome: Not Progressing Goal: Will remain free from infection Outcome: Not Progressing Goal: Diagnostic test results will improve Outcome: Not Progressing Goal: Respiratory complications will improve Outcome: Not Progressing Goal: Cardiovascular complication will be avoided Outcome: Not Progressing   Problem: Activity: Goal: Risk for activity intolerance will decrease Outcome: Not Progressing   Problem: Nutrition: Goal: Adequate nutrition will be maintained Outcome: Not Progressing   Problem: Coping: Goal: Level of anxiety will  decrease Outcome: Not Progressing   Problem: Elimination: Goal: Will not experience complications related to bowel motility Outcome: Not Progressing Goal: Will not experience complications related to urinary retention Outcome: Not Progressing   Problem: Pain Managment: Goal: General experience of comfort will improve and/or be controlled Outcome: Not Progressing   Problem: Safety: Goal: Ability to remain free from injury will improve Outcome: Not Progressing   Problem: Skin Integrity: Goal: Risk for impaired skin integrity will decrease Outcome: Not Progressing   Problem: Fluid Volume: Goal: Hemodynamic stability will improve Outcome: Not Progressing   Problem: Clinical Measurements: Goal: Diagnostic test results will improve Outcome: Not Progressing Goal: Signs and symptoms of infection will decrease Outcome: Not Progressing   Problem: Respiratory: Goal: Ability to maintain adequate ventilation will improve Outcome: Not Progressing   Problem: Education: Goal: Knowledge of the prescribed therapeutic regimen will improve Outcome: Not Progressing   Problem: Activity: Goal: Ability to implement measures to reduce episodes of fatigue will improve Outcome: Not Progressing   Problem: Bowel/Gastric: Goal: Will not experience complications related to bowel motility Outcome: Not Progressing   Problem: Coping: Goal: Ability to identify and develop effective coping behavior will improve Outcome: Not Progressing   Problem: Nutritional: Goal: Maintenance of adequate nutrition will improve Outcome: Not Progressing   Problem: Education: Goal: Knowledge of disease or condition will improve Outcome: Not Progressing Goal: Knowledge of the prescribed therapeutic regimen will improve Outcome: Not Progressing Goal: Individualized Educational Video(s) Outcome: Not Progressing   Problem: Activity: Goal: Ability to tolerate increased activity will improve Outcome: Not  Progressing Goal: Will verbalize the importance of balancing activity with adequate rest periods Outcome: Not Progressing   Problem: Respiratory: Goal: Ability to maintain a clear airway will improve Outcome: Not Progressing Goal: Levels of oxygenation will improve Outcome: Not Progressing Goal: Ability to maintain adequate ventilation will improve Outcome: Not Progressing   Problem: Education: Goal:  Ability to demonstrate management of disease process will improve Outcome: Not Progressing Goal: Ability to verbalize understanding of medication therapies will improve Outcome: Not Progressing Goal: Individualized Educational Video(s) Outcome: Not Progressing   Problem: Activity: Goal: Capacity to carry out activities will improve Outcome: Not Progressing   Problem: Cardiac: Goal: Ability to achieve and maintain adequate cardiopulmonary perfusion will improve Outcome: Not Progressing   Problem: Education: Goal: Knowledge of disease and its progression will improve Outcome: Not Progressing Goal: Individualized Educational Video(s) Outcome: Not Progressing   Problem: Fluid Volume: Goal: Compliance with measures to maintain balanced fluid volume will improve Outcome: Not Progressing   Problem: Health Behavior/Discharge Planning: Goal: Ability to manage health-related needs will improve Outcome: Not Progressing   Problem: Nutritional: Goal: Ability to make healthy dietary choices will improve Outcome: Not Progressing   Problem: Clinical Measurements: Goal: Complications related to the disease process, condition or treatment will be avoided or minimized Outcome: Not Progressing

## 2024-06-08 NOTE — Progress Notes (Signed)
 Pharmacy Antibiotic Note  Megan [name] is a 63 y.o. female admitted on 06/08/2024 with unknown source of infection.  Pharmacy has been consulted for vancomycin  dosing.  Plan: Vancomycin  1500 mg IV x 1 dose. Vancomycin  750 mg IV every MWF w/ HD Cefepime  2000 mg IV x 1 dose followed by 2000 mg IV every MWF w/HD Monitor labs, c/s, and vanco levels as indicated.  Height: 5' 2 (157.5 cm) Weight: 79.6 kg (175 lb 7.8 oz) IBW/kg (Calculated) : 50.1  Temp (24hrs), Avg:99.3 F (37.4 C), Min:97.5 F (36.4 C), Max:102.2 F (39 C)  Recent Labs  Lab 06/05/24 0834 06/08/24 0205 06/08/24 1131  WBC 1.8* 0.8* 0.3*  CREATININE 7.29* 8.97*  --   LATICACIDVEN  --   --  1.9    Estimated Creatinine Clearance: 6.4 mL/min (A) (by C-G formula based on SCr of 8.97 mg/dL (H)).    Allergies  Allergen Reactions   Tana [Sacituzumab Govitecan-Hziy] Other (See Comments)    wheezing   Motrin [Ibuprofen] Other (See Comments)    ESRD     Antimicrobials this admission: Vanco 10/26 >> Cefepime  10/26 >>    Microbiology results: 10/26 BCx: pending   Thank you for allowing pharmacy to be a part of this patient's care.  Elspeth Sour, PharmD Clinical Pharmacist 06/08/2024 1:06 PM

## 2024-06-08 NOTE — Progress Notes (Signed)
 Patient Care Team: Duanne Butler DASEN, MD as PCP - General (Family Medicine) Alvan, Dorn FALCON, MD as PCP - Cardiology (Cardiology) Shaaron Lamar HERO, MD (Gastroenterology) Debera Jayson MATSU, MD as Consulting Physician (Cardiology) Rayburn Pac, MD as Consulting Physician (Nephrology)  Clinic Day:  06/08/2024  Referring physician: Duanne Butler DASEN, MD   CHIEF COMPLAINT:  CC: Stage Ib (T1CN0) right breast cancer, ER positive, PR and HER-2 negative    ASSESSMENT & PLAN:   Assessment & Plan: Megan [name]  is a 63 y.o. 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  Diagnosed in 2019. S/p right breast lumpectomy, Oncotype DX score of 47.  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  Also had prophylactic left simple mastectomy. First recurrence: 07/13/2020.  Local recurrence, s/p right breast lumpectomy. Second occurrence: 10/12/2020.  Local recurrence.  S/p right mastectomy and radiation therapy.Patient decided against chemotherapy considering poor tolerance with the first time and bad peripheral neuropathy. Third recurrence: 11/30/2022: Within the right medial pectoralis musculature-local recurrence.  S/p right chest wall mass resection consistent with invasive ductal carcinoma.  Triple negative. S/p RT.  6 cycles of carboplatin /docetaxel  and pembrolizumab . PET scan in 01/2024 showed abnormal lymph nodes in the left axillary region.  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 Triple negative.  Germline mutation analysis: Negative.  [facility] NGS: PD-L1: 10, no other targetable mutations Started on capecitabine  and pembrolizumab .  Patient completed 1 cycle and was admitted to the hospital for  shortness of breath.  CT scan at this time showed progression of disease with lung metastasis and liver metastasis.   -Patient had severe diarrhea and weakness from sacituzumab govitecan. - Will dose reduce sacituzumab govitecan dose 7.5 mg/kg today. - Labs reviewed today: CMP: Creatinine: 7.29, LFTs: Normal, CBC: WBC: 1.8, hemoglobin: 8.6, platelets: 148, ANC: 1000 - Last echo from 12/2020 with normal LVEF.  Will repeat an echo to get a baseline - Okay to proceed with treatment today but will dose reduce  Return to clinic in 3 weeks with labs for follow-up and next cycle of infusion  Diarrhea Patient has significant diarrhea more than 4 episodes a day.  Secondary to sacituzumab govitecan Patient is currently using Lomotil  alone.  - Discussed using Imodium  after every episode of diarrhea up to 5 times a day and using Lomotil  as needed up to 4 times a day. - Will give 500 cc bolus of IV fluids today. - Will dose reduce sacituzumab govitecan  Pancytopenia Likely due to myelosuppression from sacituzumab govitecan.  - Will administer G-CSF after this infusion -Will closely monitor for counts and dose reduced further if pancytopenia continues.   Cough due to lung metastases Persistent cough likely from lung metastases. No antibiotics needed at this time.  - Continue Tessalon  perles for cough management. - Provided refills for Tessalon  perles as needed. - Continue oxygen  support  Peripheral neuropathy Existing peripheral neuropathy may worsen with new treatment.    -Recommended to report any symptom changes. - Monitor for worsening neuropathy symptoms. - Instruct her to report any increase in numbness or tingling.  Anemia Anemia with low hemoglobin.  Likely secondary to myelosuppression.  Anemia panel with elevated ferritin last time  - Monitor hemoglobin levels closely.  End-stage renal disease on  dialysis End-stage renal disease managed with dialysis.   - Continue regular  dialysis sessions. - Monitor kidney function closely.     The patient understands the plans discussed today and is in agreement with them.  She knows to contact our office if she develops concerns prior to her next appointment.  The total time spent in the appointment was 30 minutes for the encounter  with patient, including review of chart and various tests results, discussions about plan of care and coordination of care plan   Mickiel Dry, MD  [facility] [place] - A DEPT OF [name] [address] [place] [address] Dept: [phone] Dept Fax: [phone]   No orders of the defined types were placed in this encounter.    ONCOLOGY HISTORY:   I have reviewed her chart and materials related to her cancer extensively and collaborated history with the patient. Summary of oncologic history is as follows:   Diagnosis: Stage Ib (T1CN0) right breast cancer, ER positive, PR and HER-2 negative   -02/18/2018: Screening Mammogram: possible mass in the right breast. -02/26/2018: Diagnostic Mammogram and US  of right breast: Several small 4-5 mm cysts, with a bilobed cyst at the 1 o'clock position 6 cm from nipple measuring 0.5 x 0.3 x 0.4 cm. Irregular shadowing mass in the right breast at the 12:30 position 6 cm from nipple measuring 0.7 x 0.8 x 0.6 cm. -03/05/2018: Right breast mass biopsy.  Pathology: Benign breast tissue with areas of dense hyaline fibrosis. No evidence of malignancy.  -08/20/2018: Diagnostic Right breast Mammogram: Suspicious mass in the 1 o'clock location of the RIGHT breast.  -08/27/2018: Right breast mass biopsy.  Pathology: Invasive ductal carcinoma. Based on the biopsy, the carcinoma appears Nottingham grade 3 of 3 and measures 0.5 cm in greatest linear extent. The tumor cells are ER positive (50%), PR negative (0%), and Her2 negative (2+). Ki67 60%. -09/18/2018: Right lumpectomy and sentinel lymph  node biopsy.  Pathology: Invasive ductal carcinoma, grade 3, spanning 1.5 cm. High grade ductal carcinoma in situ with necrosis. Invasive carcinoma comes to within 0.1-0.2 cm of the anterior margin focally. Three of three lymph nodes negative for metastatic carcinoma.  Oncotype DX: 47 -10/30/2018 to 01/01/2019: 4 cycles of adjuvant AC -01/2019- 10/2023: Anastrozole  -09/02/2019: Mammogram: 8 mm group of indeterminate calcifications in the 3 o'clock position of the right breast. -09/12/2019: Right breast biopsy.  Pathology: DCIS, high nuclear grade with central necrosis and calcifications.  -02/18/2020: Right partial mastectomy. Pathology: Ductal carcinoma in situ, high-grade, with central necrosis and calcifications. No evidence of invasive carcinoma. Resection margins are negative for DCIS. Eleven lymph nodes, negative for carcinoma (0/11). -06/15/2020: Prophylactic Left simple mastectomy with right breast bisopy.  Pathology of left breast: Benign breast parenchyma with mild fibrocystic change.Negative for carcinoma. Pathology of right breast biopsy: Microscopic focus of invasive ductal carcinoma in a background of extensive high-grade ductal carcinoma in situ with necrosis and rare calcifications measuring 0.1 cm in greatest linear dimension.  -07/13/2020: Right breast lumpectomy.  Pathology: Multifocal invasive ductal carcinoma, grade 3, largest spanning 1.2 cm. Extensive high grade ductal carcinoma in situ with necrosis. Invasive carcinoma is 1-2 mm from the medial margin focally and 2 mm from the lateral margin focally.  ER/PR/HER-2 is negative.  Ki-67 is 20%.  -09/14/2020: PET: Areas of nodularity about the RIGHT breast in this patient who is undergone previous lumpectomy x2 and RIGHT mastectomy. Most suspicious area is associated with discrete  nodule in the fat of the inferior chest wall within the subcutaneous tissues. Scattered small areas along the pectoralis musculature tracking laterally  largest on image 56 of series 4 also associated with low level uptake. Small pulmonary nodules without interval change. -09/28/2020: US  of right chest wall: Mass-like abnormality in the upper inner aspect of the right anterior chest, designated at 1 o'clock, 7 cm from the central aspect of the mastectomy scar. This measures 4.1 cm in greatest dimension. 1.7 cm hypoechoic mass the central anterior right chest deep to the mid aspect of the mastectomy scar. Possible borderline enlarged residual right axillary lymph node.  -10/12/2020: Right breast biopsy.  Pathology: Invasive and in situ ductal carcinoma, 1.3 x 1.1 x 0.8 cm. Invasive carcinoma focally 0.1 cm from superior margin. DCIS less than 0.1 cm from superior margin.  -10/28/2020: [facility] results: Negative. -12/2020: Right mastectomy followed by radiation therapy completed. Patient decided against chemotherapy considering poor tolerance with the first time and bad peripheral neuropathy. -11/30/2022: PET: Recurrent/metastatic disease within the right medial pectoralis musculature in this patient who is status post bilateral mastectomy. A right suprarenal 8 mm nodule is likely arising from the adrenal gland. -12/19/2022: Right chest wall mass biopsy.  Pathology: Metastatic adenocarcinoma. Tumor cells are ER positive (40%), PR negative (0%), and Her2 negative (0). Ki-67 30%. -01/18/2023: Right chest wall mass resection with right breast biopsy. -Pathology of right chest wall: Invasive ductal carcinoma (clinically recurrent), 1.7 cm, grade 3. Ductal carcinoma in situ, high-grade with necrosis. Carcinoma involves the inked inferior margin. Carcinoma focally invades into atrophic skeletal muscle. -Pathology of right breast biopsy: Grade 3 Invasive ductal carcinoma. Lymphovascular invasion not identified. Tumor cells ER negative (0%), PR negative (0%), and Her negative (0). Ki-67 35%. -04/18/2023: Patient's case discussed at multidisciplinary clinic at [facility]. Surgical oncology recommended radiation therapy. Patient also evaluated by medical oncology who recommended systemic therapy with 6 cycles of docetaxel , carboplatin  and pembrolizumab .  -05/07/2023 to 05/25/2023: XRT -06/14/2023 to 09/27/2023:  6 cycles of carboplatin , docetaxel  and pembrolizumab   -01/31/2024: PET: multiple abnormal lymph nodes identified in the left axillary region and chest wall which are hypermetabolic there is also a node in the supraclavicular region on the left. These are concerning for worsening metastatic disease. -02/26/2024: Left axillary lymph node biopsy.  -Pathology: Metastatic carcinoma involving a lymph node, compatible with patient's clinical history of primary breast carcinoma. All prognostic markers negative (0). Ki-67 20%.  -[facility] NGS: PDL1: Positive, CPS: 10, ER/PR/HER2/NEU: Triple negative, HER2: Negative, MSI-stable, BRAF, NTRK 1/2/3, RET: Negative, genomic LOH: Low, TMB: Low 25mut/Mb, ESR 1: Not detected, PALB2: Not detected PIK3CA: Not detected -03/03/2024: CT AP w contrast: New right lower lobe nodules worrisome for metastatic disease. -03/12/2024: BRCA1/2 analysis: Germline mutation analysis: NTHL1- Variant of unknown significance -03/26/2024: Patient was seen by Dr. [name] coons oncologist] at [facility], who recommended systemic treatment with pembrolizumab  and significantly dose-reduced capecitabine  (500 mg BID). If disease progression occurs, next line of treatment may be dose-adjusted sacituzumab govitecan + pembrolizumab  (Considering poor data for either in patients with ESRD  -04/08/2024: CEA 15-3: 67, CA 27-29: 98.7 -04/17/2024-05/08/2024:  Capecitabine (dose reduced ) + pembrolizumab  -05/22/2024: PET Scan: Marked progression of metastatic breast cancer as evidenced by widespread pulmonary metastatic disease, progressive low neck, chest and upper abdominal adenopathy, new liver metastasis and new intercostal metastasis. -  05/29/2024-current: Sacituzumab govitecan every 3 weeks.   Current Treatment:  Sacituzumab govitecan every 3 weeks.  INTERVAL HISTORY:   Megan [name] is  a 63 year old female with metastatic breast cancer who presents for follow-up today and was seen in infusion for symptomatic visit.  Patient is accompanied by her husband and sister today.  She reported feeling very fatigued and having several bouts of diarrhea that is not controlled with Lomotil .  Patient is not using Imodium  at this time.  She also has a persistent and severe cough, with slightly improved with Tessalon  Perles.  She is on supplemental oxygen  at 2 liters per minute, increasing to 2.5 liters as needed but needed 3 L when she came to the infusion.   She also has some oral thrush and has pain in her oral cavity.  She denies fever, chills at this time.  She reported that nausea and vomiting have improved.  I have reviewed the past medical history, past surgical history, social history and family history with the patient and they are unchanged from previous note.  ALLERGIES:  is allergic to trodelvy [sacituzumab govitecan-hziy] and motrin [ibuprofen].  MEDICATIONS:  No current facility-administered medications for this visit.   No current outpatient medications on file.   Facility-Administered Medications Ordered in Other Visits  Medication Dose Route Frequency Provider Last Rate Last Admin   acetaminophen  (TYLENOL ) tablet 650 mg  650 mg Oral Q6H PRN Adefeso, Oladapo, DO       Or   acetaminophen  (TYLENOL ) suppository 650 mg  650 mg Rectal Q6H PRN Adefeso, Oladapo, DO   650 mg at 06/08/24 2011   albuterol  (PROVENTIL ) (2.5 MG/3ML) 0.083% nebulizer solution 2.5 mg  2.5 mg Nebulization Q4H PRN Adefeso, Oladapo, DO   2.5 mg at 06/08/24 9262   aspirin  EC tablet 81 mg  81 mg Oral Daily Adefeso, Oladapo, DO       atorvastatin  (LIPITOR) tablet 20 mg  20 mg Oral Daily Adefeso, Oladapo, DO       [START ON 06/09/2024]  ceFEPIme  (MAXIPIME ) 2 g in sodium chloride  0.9 % 100 mL IVPB  2 g Intravenous Q M,W,F-HD Johnson, Clanford L, MD       Chlorhexidine  Gluconate Cloth 2 % PADS 6 each  6 each Topical Daily Johnson, Clanford L, MD   6 each at 06/08/24 1600   cinacalcet  (SENSIPAR ) tablet 90 mg  90 mg Oral QPM Adefeso, Oladapo, DO       feeding supplement (ENSURE PLUS HIGH PROTEIN) liquid 237 mL  237 mL Oral BID BM Adefeso, Oladapo, DO       fentaNYL  (DURAGESIC ) 50 MCG/HR 1 patch  1 patch Transdermal Q72H Johnson, Clanford L, MD   1 patch at 06/08/24 1925   heparin  injection 5,000 Units  5,000 Units Subcutaneous Q8H Adefeso, Oladapo, DO   5,000 Units at 06/08/24 2142   hydrocortisone  (ANUSOL -HC) 2.5 % rectal cream   Rectal QID Pollina, Christopher J, MD   Given at 06/08/24 2019   HYDROmorphone  (DILAUDID ) injection 0.5 mg  0.5 mg Intravenous Q4H PRN Adefeso, Oladapo, DO       lidocaine  (LMX) 4 % cream   Topical Q6H PRN Vicci, Clanford L, MD   1 Application at 06/08/24 1704   loperamide  (IMODIUM ) capsule 2 mg  2 mg Oral PRN Adefeso, Oladapo, DO       metroNIDAZOLE  (FLAGYL ) IVPB 500 mg  500 mg Intravenous Q12H Johnson, Clanford L, MD 100 mL/hr at 06/08/24 2000 Infusion Verify at 06/08/24 2000   ondansetron  (ZOFRAN ) tablet 4 mg  4 mg Oral Q6H PRN Adefeso, Oladapo, DO       Or   ondansetron  (  ZOFRAN ) injection 4 mg  4 mg Intravenous Q6H PRN Adefeso, Oladapo, DO       pantoprazole  (PROTONIX ) EC tablet 40 mg  40 mg Oral BID AC Adefeso, Oladapo, DO       [START ON 06/09/2024] vancomycin  (VANCOREADY) IVPB 750 mg/150 mL  750 mg Intravenous Q M,W,F-HD Johnson, Clanford L, MD       witch hazel-glycerin (TUCKS) pad   Topical PRN Pollina, Lonni PARAS, MD        REVIEW OF SYSTEMS:   Constitutional: Denies fevers, chills or abnormal weight loss Eyes: Denies blurriness of vision Ears, nose, mouth, throat, and face: Denies mucositis or sore throat Respiratory: Denies cough, dyspnea or wheezes Cardiovascular: Denies palpitation,  chest discomfort or lower extremity swelling Gastrointestinal:  Denies nausea, heartburn or change in bowel habits Skin: Denies abnormal skin rashes Lymphatics: Denies new lymphadenopathy or easy bruising Neurological:Denies numbness, tingling or new weaknesses Behavioral/Psych: Mood is stable, no new changes  All other systems were reviewed with the patient and are negative.   VITALS:  There were no vitals taken for this visit.  Wt Readings from Last 3 Encounters:  06/08/24 175 lb 7.8 oz (79.6 kg)  06/05/24 172 lb 11.2 oz (78.3 kg)  05/31/24 176 lb (79.8 kg)    There is no height or weight on file to calculate BMI.  Performance status (ECOG): 0 - Asymptomatic  PHYSICAL EXAM:   GENERAL:alert, no distress and comfortable SKIN: skin color, texture, turgor are normal, no rashes or significant lesions HEENT: Oral thrush present on her posterior side of the cheeks EYES: normal, Conjunctiva are pink and non-injected, sclera clear LUNGS: clear to auscultation and percussion with normal breathing effort HEART: regular rate & rhythm and no murmurs and no lower extremity edema ABDOMEN:abdomen soft, non-tender and normal bowel sounds Musculoskeletal:no cyanosis of digits and no clubbing  NEURO: alert & oriented x 3 with fluent speech, no focal motor/sensory deficits  LABORATORY DATA:  I have reviewed the data as listed   Latest Reference Range & Units 06/05/24 08:34  WBC 4.0 - 10.5 K/uL 1.8 (L)  RBC 3.87 - 5.11 MIL/uL 2.43 (L)  Hemoglobin 12.0 - 15.0 g/dL 8.6 (L)  HCT 63.9 - 53.9 % 27.2 (L)  MCV 80.0 - 100.0 fL 111.9 (H)  MCH 26.0 - 34.0 pg 35.4 (H)  MCHC 30.0 - 36.0 g/dL 68.3  RDW 88.4 - 84.4 % 17.5 (H)  Platelets 150 - 400 K/uL 148 (L)  nRBC 0.0 - 0.2 % 0.0  Neutrophils % 57  Lymphocytes % 30  Monocytes Relative % 7  Eosinophil % 5  Basophil % 0  Immature Granulocytes % 1  NEUT# 1.7 - 7.7 K/uL 1.0 (L)  Lymphs Abs 0.7 - 4.0 K/uL 0.5 (L)  Monocyte # 0.1 - 1.0 K/uL 0.1   Eosinophils Absolute 0.0 - 0.5 K/uL 0.1  Basophils Absolute 0.0 - 0.1 K/uL 0.0  Abs Immature Granulocytes 0.00 - 0.07 K/uL 0.02  (L): Data is abnormally low (H): Data is abnormally high   Latest Reference Range & Units 06/05/24 08:34  Sodium 135 - 145 mmol/L 138  Potassium 3.5 - 5.1 mmol/L 3.7  Chloride 98 - 111 mmol/L 96 (L)  CO2 22 - 32 mmol/L 28  Glucose 70 - 99 mg/dL 870 (H)  BUN 8 - 23 mg/dL 53 (H)  Creatinine 9.55 - 1.00 mg/dL 2.70 (H)  Calcium  8.9 - 10.3 mg/dL 8.6 (L)  Anion gap 5 - 15  15  Phosphorus 2.5 - 4.6  mg/dL 7.4 (H)  Magnesium  1.7 - 2.4 mg/dL 2.2  Alkaline Phosphatase 38 - 126 U/L 90  Albumin 3.5 - 5.0 g/dL 3.0 (L)  AST 15 - 41 U/L 24  ALT 0 - 44 U/L 21  Total Protein 6.5 - 8.1 g/dL 5.7 (L)  Total Bilirubin 0.0 - 1.2 mg/dL 0.3  GFR, Estimated >39 mL/min 6 (L)  (L): Data is abnormally low (H): Data is abnormally high    RADIOGRAPHIC STUDIES: I have personally reviewed the radiological images as listed and agreed with the findings in the report.

## 2024-06-08 NOTE — Progress Notes (Signed)
 06/08/2024 2:35 PM  Updated husband at bedside with CXR results suggesting progressive metastatic disease.  He is saying he wants her to be kept comfortable and also wants everything done including intubation and mechanical ventilation.  I had discussion with him that being intubated would not be comfortable and with her BPs being soft we are limited with how much pain medication we can give if continuing full scope treatment.   He would like to discuss with patient's siblings and would like to speak with us  again in the morning.  As of this time continue full code, intubate as needed, pressors as needed, access port-a-cath as needed and try to make as comfortable as possible.   RN Jerilynn and [facility] Tim present during discussion.  KYM Louder, MD

## 2024-06-08 NOTE — Progress Notes (Signed)
 Per off-going staff, pt's husband refused lab draw this am. Husband states lab tech kept slapping pt's arm and hand and was unable to find a vein, that's why he told her to stop.  Pt is alert but not her normal self. Answers person and place questions appropriately but nothing else. She will follow commands appropriately. Has fine motor jerking of hands and eye/face, which was not noted on her previous admission. Pt mostly just stares at me and husband when we ask her questions. Per husband, pt usually only takes one oxy as needed for pain and took one yesterday prior to arrival to ED.  In ED, pt did receive 2 mg total of Dilaudid  in the ED and she still has her fentanyl  patch on. Husband is concerned about her LOC and lack of responding. MD Vicci notified of husband's concerns and currently pt status.

## 2024-06-08 NOTE — Progress Notes (Addendum)
 PROGRESS NOTE   Megan [name]  FMW:[id number] DOB: [birth year] DOA: 06/08/2024 PCP: Duanne Butler DASEN, MD   Chief Complaint  Patient presents with   Abdominal Pain   Diarrhea   Level of care: Stepdown  Brief Admission History:  63 y.o. female with medical history significant of hypertension, hyperlipidemia,  chronic diastolic heart failure, anemia of chronic disease, ESRD, GERD, COPD with chronic respiratory failure and hypoxia on 2 LPM of oxygen  at baseline, class I obesity, history of nonobstructive coronary artery disease and metastatic breast cancer who presents to the emergency department for evaluation of diarrhea.  She had her third dose of chemotherapy 2 days ago, diarrhea started yesterday, this was associated with rectal pain due to external hemorrhoids which was not responsive to home topical medication.  Imodium  used 2-3 times at home is yet to improve diarrhea.  She also complained of diffuse abdominal pain.  She denied any fever, chills, nausea, vomiting.   ED Course: In the emergency department, temperature was 97.44F, pulse 106 bpm, O2 sats 91-98% on 2 LPM of oxygen .  Workup in the ED showed WBC 0.8, hemoglobin 8.8, hematocrit 22.2, MCV 113.3, platelets 129.  BMP showed sodium 140, potassium 4.4, chloride 96, bicarb 24, blood glucose 135, BUN 75, creatinine 0.97, albumin 3.0, lipase 24.  CT abdomen and pelvis with contrast showed no acute findings.  Innumerable pulmonary metastasis, left axillary nodal metastasis and inferior right hepatic metastasis, similar to recent PET. IV Dilaudid  was given due to pain, Zofran  was given and IV NS 500 mL was provided.   Assessment and Plan:  Severe Neutropenia s/p recent chemotherapy Neutropenic Fever  Presumed Sepsis  Severe sepsis with septic shock  -- presumably pt may have become bacteremic from frequent diarrhea and prolapsed hemorrhoids -- ordered blood cultures x 2, lactic acid, CBC with diff (husband had refused labs this  morning) -- bolused IV fluids and transferred patient to stepdown ICU -- low threshold to start norepinephrine infusion if BP does not improve -- broad spectrum antibiotics started after blood cultures obtained -- pt has received a dose of pegfilgrastim  on 06/06/24 thru the oncology center  -- hopefully her ANC will start to improve after 48 hours and continue to improve over next 9-12 days -- fortunately BPs have started to improve with IV fluid bolus given, follow closely  -- portable CXR ordered STAT to check for pneumonia  -- acetaminophen  ordered for fever  -- with fever and immunocompromised state, check GIP and C diff due to ongoing diarrhea  Rectal Pain  Prolapsed hemorrhoids -- has been severe from prolapsed hemorrhoids -- topical treatments ordered for hemorrhoids -- IV pain meds ordered but will hold for now as she currently is wearing a fentanyl  patch   Adult Failure to thrive -- pt does not seem to be tolerating chemo well -- remains full code  -- recent palliative consult and they were not willing to talk goals of care -- may need to reconsider as her prognosis remains tenuous and poor  Generalized Weakness -- supportive measures for now -- very low likelihood for meaningful recovery given advanced malignancy  ESRD on HD (MWF) -- consult to nephrology for inpatient HD on 10/27 if BP can tolerate  Chronic hypoxic respiratory failure -- continue supplemental oxygen  support  GERD -- Continue pantoprazole  daily  Diarrhea -- Post recent chemotherapy treatment -- Continue supportive measures for now  Essential hypertension -- With current hypotension, holding all blood pressure lowering medications  Hyperlipidemia -- Resume atorvastatin   CAD nonobstructive -- Continue medical management with aspirin  and atorvastatin  -- holding imdur  and bisoprolol due to hypotension   Macrocytic anemia -- B12 and folic acid  levels within normal limits   DVT prophylaxis: sq  heparin   Code Status: Full  Family Communication: husband updated 10/26  Disposition: transfer to stepdown ICU for higher level of care    Consultants:   Procedures:   Antimicrobials:  Cefepime  10/26>> Vanc 10/26>> Metronidazole  10/26>>   Subjective: Pt somnolent  Objective: Vitals:   06/08/24 0916 06/08/24 1032 06/08/24 1136 06/08/24 1243  BP: (!) 95/53 (!) 86/46 (!) 94/46 (!) 83/40  Pulse: (!) 111 (!) 112 (!) 109   Resp: 20 16 20 20   Temp: (!) 102.2 F (39 C) 99.5 F (37.5 C) 99.2 F (37.3 C) 98.9 F (37.2 C)  TempSrc: Rectal Oral Oral Oral  SpO2: 95% 95% 91% 95%  Weight:      Height:       No intake or output data in the 24 hours ending 06/08/24 1345 Filed Weights   06/08/24 0140 06/08/24 0459  Weight: 78 kg 79.6 kg   Examination:  General exam: Appears calm and comfortable  Respiratory system: Clear to auscultation. Respiratory effort normal. Cardiovascular system: normal S1 & S2 heard. No JVD, murmurs, rubs, gallops or clicks. No pedal edema. Gastrointestinal system: Abdomen is nondistended, soft and nontender. No organomegaly or masses felt. Normal bowel sounds heard. Central nervous system: Alert and oriented. No focal neurological deficits. Extremities: Symmetric 5 x 5 power. Skin: No rashes, lesions or ulcers. Psychiatry: Judgement and insight appear normal. Mood & affect appropriate.   Data Reviewed: I have personally reviewed following labs and imaging studies  CBC: Recent Labs  Lab 06/05/24 0834 06/08/24 0205 06/08/24 1131  WBC 1.8* 0.8* 0.3*  NEUTROABS 1.0* 0.2* 0.2*  HGB 8.6* 8.8* 8.0*  HCT 27.2* 28.2* 26.1*  MCV 111.9* 113.3* 115.5*  PLT 148* 129* 116*    Basic Metabolic Panel: Recent Labs  Lab 06/05/24 0834 06/08/24 0205  NA 138 140  K 3.7 4.4  CL 96* 96*  CO2 28 24  GLUCOSE 129* 135*  BUN 53* 75*  CREATININE 7.29* 8.97*  CALCIUM  8.6* 8.2*  MG 2.2 2.5*  PHOS 7.4* 10.3*    CBG: No results for input(s): GLUCAP in the  last 168 hours.  Recent Results (from the past 240 hours)  Culture, blood (Routine X 2) w Reflex to ID Panel     Status: None (Preliminary result)   Collection Time: 06/08/24 10:25 AM   Specimen: BLOOD  Result Value Ref Range Status   Specimen Description BLOOD BLOOD RIGHT ARM  Final   Special Requests   Final    AEROBIC BOTTLE ONLY Blood Culture results may not be optimal due to an inadequate volume of blood received in culture bottles Performed at [facility], [address], [place], [place] [address]    Culture PENDING  Incomplete   Report Status PENDING  Incomplete  Culture, blood (Routine X 2) w Reflex to ID Panel     Status: None (Preliminary result)   Collection Time: 06/08/24 10:25 AM   Specimen: BLOOD  Result Value Ref Range Status   Specimen Description BLOOD BLOOD RIGHT HAND  Final   Special Requests   Final    AEROBIC BOTTLE ONLY Blood Culture results may not be optimal due to an inadequate volume of blood received in culture bottles Performed at [facility], [address], [place], [place] [address]  Culture PENDING  Incomplete   Report Status PENDING  Incomplete     Radiology Studies: CT ABDOMEN PELVIS W CONTRAST Result Date: 06/08/2024 EXAM: CT ABDOMEN AND PELVIS WITH CONTRAST 06/08/2024 03:05:08 AM TECHNIQUE: CT of the abdomen and pelvis was performed with the administration of 100 mL of iohexol  (OMNIPAQUE ) 300 MG/ML solution. Multiplanar reformatted images are provided for review. Automated exposure control, iterative reconstruction, and/or weight-based adjustment of the mA/kV was utilized to reduce the radiation dose to as low as reasonably achievable. COMPARISON: PET CT dated 05/22/2024. CLINICAL HISTORY: Abdominal pain, acute, nonlocalized. Pt with c/o abdominal cramping and loose stools (5) since Chemo on Thurs and dialysis on Fri. Husband states pt has had 2 Imodium  prior to arrival. FINDINGS: LOWER CHEST: Innumerable pulmonary metastases, similar to recent  PET. 15 mm short axis left axillary nodal metastasis (image 1), incompletely visualized, grossly unchanged. LIVER: 14 mm inferior right hepatic metastasis (image 19), unchanged. GALLBLADDER AND BILE DUCTS: Status post cholecystectomy. No biliary ductal dilatation. SPLEEN: No acute abnormality. PANCREAS: No acute abnormality. ADRENAL GLANDS: No acute abnormality. KIDNEYS, URETERS AND BLADDER: Severe bilateral renal cortical scarring/atrophy. Numerous bilateral renal calculi measuring up to 7 mm on the right. Small simple bilateral renal cysts measuring up to 15 mm on the left, benign. Per consensus, no follow-up is needed for simple Bosniak type 1 and 2 renal cysts. No hydronephrosis. No perinephric or periureteral stranding. Urinary bladder is unremarkable. GI AND BOWEL: Stomach demonstrates no acute abnormality. There is no bowel obstruction. PERITONEUM AND RETROPERITONEUM: No ascites. No free air. VASCULATURE: Atherosclerotic calcifications of the abdominal aorta and branch vessels, although patent. Aorta is normal in caliber. LYMPH NODES: No lymphadenopathy. REPRODUCTIVE ORGANS: Status post hysterectomy. BONES AND SOFT TISSUES: No acute osseous abnormality. No focal soft tissue abnormality. IMPRESSION: 1. No acute findings. 2. Innumerable pulmonary metastases, left axillary nodal metastasis, and inferior right hepatic metastasis, similar to recent PET. 3. Additional ancillary findings, as above. Electronically signed by: Pinkie Pebbles MD 06/08/2024 03:12 AM EDT RP Workstation: [id number]    Scheduled Meds:  aspirin  EC  81 mg Oral Daily   atorvastatin   20 mg Oral Daily   cinacalcet   90 mg Oral QPM   feeding supplement  237 mL Oral BID BM   heparin   5,000 Units Subcutaneous Q8H   hydrocortisone    Rectal QID   pantoprazole   40 mg Oral BID AC   Continuous Infusions:  [START ON 06/09/2024] ceFEPime  (MAXIPIME ) IV     lactated ringers      metronidazole  500 mg (06/08/24 1307)   vancomycin      [START  ON 06/09/2024] vancomycin        LOS: 0 days   Critical Care Procedure Note Authorized and Performed by: KYM Louder MD  Total Critical Care time:  63 mins Due to a high probability of clinically significant, life threatening deterioration, the patient required my highest level of preparedness to intervene emergently and I personally spent this critical care time directly and personally managing the patient.  This critical care time included obtaining a history; examining the patient, pulse oximetry; ordering and review of studies; arranging urgent treatment with development of a management plan; evaluation of patient's response of treatment; frequent reassessment; and discussions with other providers.  This critical care time was performed to assess and manage the high probability of imminent and life threatening deterioration that could result in multi-organ failure.  It was exclusive of separately billable procedures and treating other patients and teaching time.    Jamielee Mchale Currie,  MD How to contact the [facility] Attending or Consulting provider 7A - 7P or covering provider during after hours 7P -7A, for this patient?  Check the care team in [facility] and look for a) attending/consulting [facility] provider listed and b) the [facility] team listed Log into [email or web address] to find provider on call.  Locate the [facility] provider you are looking for under [facility] and page to a number that you can be directly reached. If you still have difficulty reaching the provider, please page the [facility] (Director on Call) for the Hospitalists listed on amion for assistance.  06/08/2024, 1:45 PM

## 2024-06-08 NOTE — Progress Notes (Signed)
 Pt tx via ED stretcher & admitted to room 306. Patient sleepy, but A/Ox4. Husband at bedside. Admission profile complete. No needs at this time. Call light within in reach.

## 2024-06-08 NOTE — Progress Notes (Signed)
   06/08/24 1136  Vitals  Temp 99.2 F (37.3 C)  Temp Source Oral  BP (!) 94/46  MAP (mmHg) (!) 60  BP Location Right Arm  BP Method Automatic  Patient Position (if appropriate) Lying  Pulse Rate (!) 109  Pulse Rate Source Dinamap  Resp 20  Level of Consciousness  Level of Consciousness Alert  MEWS COLOR  MEWS Score Color Yellow  Oxygen  Therapy  SpO2 91 %  O2 Device Nasal Cannula  O2 Flow Rate (L/min) 2 L/min  MEWS Score  MEWS Temp 0  MEWS Systolic 1  MEWS Pulse 1  MEWS RR 0  MEWS LOC 0  MEWS Score 2   No significant changes noted at this time. LR IVF bolus just complted and B/P slightly better. Pt with no SOB or active c/o pain. Second lab draw completed by phlebotomist, orders received for IV antibiotics. Pt's husband updated on plan of care and states understanding.

## 2024-06-08 NOTE — ED Notes (Signed)
 Admitting MD at bedside.

## 2024-06-08 NOTE — H&P (Addendum)
 History and Physical    Patient: Megan [name] FMW:[id number] DOB: 02/07/1961 DOA: 06/08/2024 DOS: the patient was seen and examined on 06/08/2024 PCP: Duanne Butler DASEN, MD  Patient coming from: Home  Chief Complaint:  Chief Complaint  Patient presents with   Abdominal Pain   Diarrhea   HPI: Megan [name] is a 63 y.o. female with medical history significant of hypertension, hyperlipidemia,  chronic diastolic heart failure, anemia of chronic disease, ESRD, GERD, COPD with chronic respiratory failure and hypoxia on 2 LPM of oxygen  at baseline, class I obesity, history of nonobstructive coronary artery disease and metastatic breast cancer who presents to the emergency department for evaluation of diarrhea.  She had her third dose of chemotherapy 2 days ago, diarrhea started yesterday, this was associated with rectal pain due to external hemorrhoids which was not responsive to home topical medication.  Imodium  used 2-3 times at home is yet to improve diarrhea.  She also complained of diffuse abdominal pain.  She denied any fever, chills, nausea, vomiting.  ED Course: In the emergency department, temperature was 97.80F, pulse 106 bpm, O2 sats 91-98% on 2 LPM of oxygen .  Workup in the ED showed WBC 0.8, hemoglobin 8.8, hematocrit 22.2, MCV 113.3, platelets 129.  BMP showed sodium 140, potassium 4.4, chloride 96, bicarb 24, blood glucose 135, BUN 75, creatinine 0.97, albumin 3.0, lipase 24. CT abdomen and pelvis with contrast showed no acute findings.  Innumerable pulmonary metastasis, left axillary nodal metastasis and inferior right hepatic metastasis, similar to recent PET. IV Dilaudid  was given due to pain, Zofran  was given and IV NS 500 mL was provided.    Review of Systems: As mentioned in the history of present illness. All other systems reviewed and are negative. Past Medical History:  Diagnosis Date   (HFpEF) heart failure with preserved ejection fraction (HCC)    a. 01/2019  Echo: EF 55-60%, mild conc LVH. DD.  Torn MV chordae.   Anemia    Atypical chest pain    a. 08/2018 MV: EF 59%, no ischemia; b. 02/2019 Cath: nonobs dzs.   Blood transfusion without reported diagnosis    Breast cancer (HCC) 10/12/2020   Cataract    ESRD (end stage renal disease) on dialysis [facility])    a. HD T, T, S   Essential hypertension, benign    GERD (gastroesophageal reflux disease)    Headache    Hemorrhoids    Mixed hyperlipidemia    Morbid obesity (HCC)    Non-obstructive CAD (coronary artery disease)    a. 02/2019 CathL LM nl, LAD 78m, LCX nl, RCA 25p, EF 55-65%.   PONV (postoperative nausea and vomiting)    Renal insufficiency    S/P colonoscopy 08/2009   Dr. Rollin: sessile polyp (benign lymphoid), large hemorrhoids, repeat 5-10 years   Temporal arteritis (HCC)    Type 2 diabetes mellitus (HCC)    Wears glasses    Past Surgical History:  Procedure Laterality Date   A/V SHUNT INTERVENTION Left 04/16/2024   Procedure: A/V SHUNT INTERVENTION;  Surgeon: Pearline Norman RAMAN, MD;  Location: [facility];  Service: Cardiovascular;  Laterality: Left;   ABDOMINAL HYSTERECTOMY     APPENDECTOMY     ARTERY BIOPSY N/A 05/09/2018   Procedure: RIGHT TEMPORAL ARTERY BIOPSY;  Surgeon: Kimble Agent, MD;  Location: [facility] OR;  Service: General;  Laterality: N/A;   BIOPSY  10/04/2023   Procedure: BIOPSY;  Surgeon: Shaaron Lamar HERO, MD;  Location: [facility];  Service: Endoscopy;;  BREAST BIOPSY Right 06/15/2020   Procedure: RIGHT BREAST BIOPSY;  Surgeon: Mavis Anes, MD;  Location: [facility];  Service: General;  Laterality: Right;   CATARACT EXTRACTION W/PHACO Left 02/09/2017   Procedure: CATARACT EXTRACTION PHACO AND INTRAOCULAR LENS PLACEMENT LEFT EYE;  Surgeon: Perley Hamilton, MD;  Location: [facility];  Service: Ophthalmology;  Laterality: Left;  CDE: 4.89   CATARACT EXTRACTION W/PHACO Right 06/04/2017   Procedure: CATARACT EXTRACTION PHACO AND INTRAOCULAR LENS PLACEMENT (IOC);  Surgeon: Perley Hamilton, MD;   Location: [facility];  Service: Ophthalmology;  Laterality: Right;  CDE: 4.12   CHOLECYSTECTOMY  09/29/2011   Procedure: LAPAROSCOPIC CHOLECYSTECTOMY;  Surgeon: Anes DELENA Mavis, MD;  Location: [facility];  Service: General;  Laterality: N/A;   COLONOSCOPY  08/2009   Dr. Rollin: sessile polyp (benign lymphoid), large hemorrhoids, repeat 5-10 years   COLONOSCOPY N/A 06/12/2016   prominent hemorrhoids   COLONOSCOPY WITH PROPOFOL  N/A 06/16/2021   Procedure: COLONOSCOPY WITH PROPOFOL ;  Surgeon: Shaaron Lamar HERO, MD;  [facility];  Service: Endoscopy;  Laterality: N/A;  9:30am (dialysis pt)   ESOPHAGOGASTRODUODENOSCOPY  09/05/2011   MFM:Dfjoo hiatal hernia; remainder of exam normal. No explanation for patient's abdominal pain with today's examination   ESOPHAGOGASTRODUODENOSCOPY N/A 12/17/2013   Dr. Shaaron: gastric erythema, erosion, mild chronic inflammation on path    ESOPHAGOGASTRODUODENOSCOPY N/A 12/31/2023   Procedure: EGD (ESOPHAGOGASTRODUODENOSCOPY);  Surgeon: Wilhelmenia Aloha Raddle., MD;  Location: [facility];  Service: Gastroenterology;  Laterality: N/A;   ESOPHAGOGASTRODUODENOSCOPY (EGD) WITH PROPOFOL  N/A 06/16/2021   Procedure: ESOPHAGOGASTRODUODENOSCOPY (EGD) WITH PROPOFOL ;  Surgeon: Shaaron Lamar HERO, MD;  [facility];  Service: Endoscopy;  Laterality: N/A;   ESOPHAGOGASTRODUODENOSCOPY (EGD) WITH PROPOFOL  N/A 10/04/2023   Procedure: ESOPHAGOGASTRODUODENOSCOPY (EGD) WITH PROPOFOL ;  Surgeon: Shaaron Lamar HERO, MD;  [facility];  Service: Endoscopy;  Laterality: N/A;  200pm, ok rm 1-2, pt knows to arrive at 6:45   EUS N/A 12/31/2023   Procedure: ULTRASOUND, UPPER GI TRACT, ENDOSCOPIC;  Surgeon: Wilhelmenia Aloha Raddle., MD;  Location: [facility];  Service: Gastroenterology;  Laterality: N/A;   EXCISION OF BREAST BIOPSY Right 10/12/2020   Procedure: EXCISION OF RIGHT BREAST BIOPSY;  Surgeon: Mavis Anes, MD;  Location: [facility];  Service: General;  Laterality: Right;   IR  DIALY SHUNT INTRO NEEDLE/INTRACATH INITIAL W/IMG LEFT Left 07/17/2023   LAPAROSCOPIC APPENDECTOMY  09/29/2011   Procedure: APPENDECTOMY LAPAROSCOPIC;  Surgeon: Anes DELENA Mavis, MD;  Location: [facility];  Service: General;;  incidental appendectomy   LEFT HEART CATH AND CORONARY ANGIOGRAPHY N/A 02/28/2019   Procedure: LEFT HEART CATH AND CORONARY ANGIOGRAPHY;  Surgeon: Dann Candyce RAMAN, MD;  Location: [facility];  Service: Cardiovascular;  Laterality: N/A;   MASS EXCISION Right 01/18/2023   Procedure: EXCISION MASS, RIGHT CHEST S/P MASTECTOMY;  Surgeon: Mavis Anes, MD;  Location: [facility];  Service: General;  Laterality: Right;   MASTECTOMY MODIFIED RADICAL Right 02/18/2020   Procedure: MASTECTOMY MODIFIED RADICAL;  Surgeon: Mavis Anes, MD;  Location: [facility];  Service: General;  Laterality: Right;   MASTECTOMY, PARTIAL Right 07/13/2020   Procedure: RIGHT PARTIAL MASTECTOMY;  Surgeon: Mavis Anes, MD;  Location: [facility];  Service: General;  Laterality: Right;   PARTIAL MASTECTOMY WITH NEEDLE LOCALIZATION AND AXILLARY SENTINEL LYMPH NODE BX Right 09/18/2018   Procedure: RIGHT PARTIAL MASTECTOMY AFTER NEEDLE LOCALIZATION, SENTINEL LYMPH NODE BIOPSY RIGHT AXILLA;  Surgeon: Mavis Anes, MD;  Location: [facility];  Service: General;  Laterality: Right;   POLYPECTOMY  06/16/2021  Procedure: POLYPECTOMY;  Surgeon: Shaaron Lamar HERO, MD;  [facility];  Service: Endoscopy;;   PORT-A-CATH REMOVAL Right 11/29/2023   Procedure: REMOVAL PORT-A-CATH;  Surgeon: Mavis Anes, MD;  Location: [facility];  Service: General;  Laterality: Right;  MINOR PROCEDURE ROOM   PORTACATH PLACEMENT Right 06/07/2023   Procedure: INSERTION PORT-A-CATH, RIGHT  (DIALYSIS ACCESS ON LEFT);  Surgeon: Mavis Anes, MD;  Location: [facility];  Service: General;  Laterality: Right;   PORTACATH PLACEMENT Right 04/03/2024   Procedure: INSERTION, TUNNELED CENTRAL VENOUS DEVICE, WITH PORT;  Surgeon: Mavis Anes, MD;  Location: [facility];  Service: General;  Laterality: Right;   SIMPLE MASTECTOMY WITH AXILLARY SENTINEL NODE BIOPSY Left 06/15/2020   Procedure: LEFT SIMPLE MASTECTOMY;  Surgeon: Mavis Anes, MD;  Location: [facility];  Service: General;  Laterality: Left;   Social History:  reports that she has never smoked. She has never been exposed to tobacco smoke. She has never used smokeless tobacco. She reports that she does not drink alcohol and does not use drugs.  Allergies  Allergen Reactions   Tana [Sacituzumab Govitecan-Hziy] Other (See Comments)    wheezing   Motrin [Ibuprofen] Other (See Comments)    ESRD     Family History  Problem Relation Age of Onset   Hypertension Mother    Coronary artery disease Mother    Diabetes Mother    Heart attack Father    Hypertension Sister    Coronary artery disease Sister    Hypertension Brother    Colon cancer Brother    Heart attack Maternal Grandmother    Heart attack Maternal Grandfather    Heart attack Paternal Grandmother    Heart attack Paternal Grandfather    Hypertension Son    Heart attack Maternal Aunt    Hypertension Maternal Aunt    Diabetes Maternal Aunt    Heart attack Maternal Uncle    Hypertension Maternal Uncle    Diabetes Maternal Uncle    Heart attack Paternal Aunt    Hypertension Paternal Aunt    Diabetes Paternal Aunt    Heart attack Paternal Uncle    Hypertension Paternal Uncle    Diabetes Paternal Uncle     Prior to Admission medications   Medication Sig Start Date End Date Taking? Authorizing Provider  acetaminophen  (TYLENOL ) 500 MG tablet Take 1,000 mg by mouth every 8 (eight) hours as needed for moderate pain (pain score 4-6).    [provider]  albuterol  (PROVENTIL ) (2.5 MG/3ML) 0.083% nebulizer solution Take 3 mLs (2.5 mg total) by nebulization every 4 (four) hours as needed for wheezing or shortness of breath. 05/15/24 05/15/25  Pearlean Manus, MD  albuterol  (VENTOLIN  HFA) 108 (90 Base) MCG/ACT inhaler Inhale 2  puffs into the lungs every 6 (six) hours as needed for wheezing or shortness of breath. 05/15/24   Pearlean Manus, MD  aspirin  EC 81 MG tablet Take 81 mg by mouth daily.    [provider]  atorvastatin  (LIPITOR) 20 MG tablet TAKE 1 TABLET(20 MG) BY MOUTH DAILY 02/11/24   Duanne Butler DASEN, MD  B Complex-C-Zn-Folic Acid  (DIALYVITE  800-ZINC  15) 0.8 MG TABS Take 1 tablet by mouth daily. 06/19/22   [provider]  benzonatate  (TESSALON ) 100 MG capsule Take 1 capsule (100 mg total) by mouth 3 (three) times daily. 05/29/24 06/28/24  Davonna Siad, MD  chlorpheniramine-HYDROcodone  (TUSSIONEX) 10-8 MG/5ML Take 5 mLs by mouth every 12 (twelve) hours as needed (Severe coughing spells.). 05/26/24   Ricky Fines, MD  cinacalcet  (SENSIPAR ) 90 MG tablet Take 90 mg by mouth daily. 03/17/24   [provider]  Darbepoetin Alfa  (ARANESP , ALBUMIN FREE, IJ) Darbepoetin Alfa  (Aranesp ) 07/06/23 07/04/24  [provider]  dexamethasone  (DECADRON ) 4 MG tablet Take 2 tablets (8 mg) daily for 3 days. Start the day after chemotherapy. 05/29/24   Kandala, Hyndavi, MD  diphenoxylate -atropine  (LOMOTIL ) 2.5-0.025 MG tablet Take 1 tablet by mouth 4 (four) times daily as needed for diarrhea or loose stools. 05/06/24   Davonna Siad, MD  fentaNYL  (DURAGESIC ) 50 MCG/HR Place 1 patch onto the skin every 3 (three) days. 06/05/24   Kandala, Hyndavi, MD  fluconazole  (DIFLUCAN ) 100 MG tablet Take 1 tablet (100 mg total) by mouth every other day. 06/05/24   Kandala, Hyndavi, MD  gabapentin  (NEURONTIN ) 300 MG capsule TAKE 1 CAPSULE(300 MG) BY MOUTH AT BEDTIME 06/12/23   Duanne Butler DASEN, MD  guaiFENesin -codeine  100-10 MG/5ML syrup Take 5 mLs by mouth every 12 (twelve) hours as needed for cough. 06/03/24   Kandala, Hyndavi, MD  isosorbide  mononitrate (IMDUR ) 30 MG 24 hr tablet Take 0.5 tablets (15 mg total) by mouth daily. 03/28/24   Alvan Dorn FALCON, MD  lanthanum  (FOSRENOL ) 1000 MG chewable tablet  Chew 2,000-3,000 mg by mouth See admin instructions. Take 3 tablets (3000 mg) by mouth with meals and take 2 tablets (2000 mg) with snacks 10/16/19   [provider]  lidocaine  (XYLOCAINE ) 2 % solution Use as directed 15 mLs in the mouth or throat every 4 (four) hours as needed for mouth pain (Mix 1:1 with Maalox and swish and spit). 05/22/24   Davonna Siad, MD  lidocaine -prilocaine  (EMLA ) cream Apply to affected area once 05/29/24   Kandala, Hyndavi, MD  loperamide  (IMODIUM  A-D) 2 MG tablet Take 2 mg by mouth 4 (four) times daily as needed for diarrhea or loose stools.    [provider]  loperamide  (IMODIUM ) 2 MG capsule Take 2 tabs by mouth with first loose stool, then 1 tab with each additional loose stool as needed. Do not exceed 8 tabs in a 24-hour period 05/29/24   Davonna Siad, MD  nebivolol  (BYSTOLIC ) 10 MG tablet TAKE 1 TABLET BY MOUTH IN THE MORNING AND AT BEDTIME 02/19/24   Duanne Butler DASEN, MD  Nebulizers (COMPRESSOR/NEBULIZER) MISC 1 Units by Does not apply route daily as needed. 05/15/24   Pearlean Manus, MD  nystatin (MYCOSTATIN) 100000 UNIT/ML suspension Take 5 mLs (500,000 Units total) by mouth 4 (four) times daily for 7 days. 06/03/24 06/10/24  Davonna Siad, MD  OLANZapine  (ZYPREXA ) 5 MG tablet Take 1 tablet (5 mg total) by mouth at bedtime. 05/06/24   Kandala, Hyndavi, MD  ondansetron  (ZOFRAN ) 8 MG tablet Take 1 tablet (8 mg total) by mouth every 8 (eight) hours as needed for nausea or vomiting. Start on the third day after chemotherapy. 05/29/24   Davonna Siad, MD  ondansetron  (ZOFRAN -ODT) 4 MG disintegrating tablet Take 1 tablet by mouth every 8 (eight) hours as needed for vomiting or nausea. 04/23/24   [provider]  oxyCODONE  (OXY IR/ROXICODONE ) 5 MG immediate release tablet Take 1 tablet (5 mg total) by mouth every 4 (four) hours as needed for moderate pain (pain score 4-6) or breakthrough pain. 05/26/24   Ricky Fines, MD  pantoprazole   (PROTONIX ) 40 MG tablet Take 1 tablet (40 mg total) by mouth 2 (two) times daily before a meal. 10/08/23   Rourk, Lamar HERO, MD  phenol (CHLORASEPTIC) 1.4 % LIQD Use as directed 1 spray in  the mouth or throat as needed for throat irritation / pain. 05/26/24   Ricky Fines, MD  predniSONE  (DELTASONE ) 20 MG tablet Take 2 tablets by mouth daily x 2 days; then 1 tablet by mouth daily x 3 days; then half tablet by mouth daily x 3 days and stop prednisone . 05/26/24   Ricky Fines, MD  prochlorperazine  (COMPAZINE ) 10 MG tablet Take 1 tablet (10 mg total) by mouth every 6 (six) hours as needed for nausea or vomiting. 05/29/24   Davonna Siad, MD    Physical Exam: Vitals:   06/08/24 0140 06/08/24 0141 06/08/24 0245  BP:  109/62 106/63  Pulse:  (!) 106 99  Resp:  20 20  Temp:  (!) 97.5 F (36.4 C)   TempSrc:  Oral   SpO2:  91% 96%  Weight: 78 kg    Height: 5' 2 (1.575 m)      General: Patient was awake and alert and oriented x3. Not in any acute distress.  HEENT: NCAT.  PERRLA. EOMI. Sclerae anicteric.  Dry mucosal membranes. Neck: Neck supple without lymphadenopathy. No carotid bruits. No masses palpated.  Cardiovascular: Regular rate with normal S1-S2 sounds. No murmurs, rubs or gallops auscultated. No JVD.  Respiratory: Clear breath sounds.  No accessory muscle use. Abdomen: Soft, mild tenderness to palpation without guarding, nondistended. Active bowel sounds. No masses or hepatosplenomegaly  Skin: No rashes, lesions, or ulcerations.  Dry, warm to touch. Musculoskeletal: AV fistula in the left forearm with normal bruit.  2+ dorsalis pedis and radial pulses. Good ROM.  No contractures  Psychiatric: Intact judgment and insight.  Mood appropriate to current condition. Neurologic: No focal neurological deficits. Strength is 5/5 x 4.  CN II - XII grossly intact.   Data Reviewed: There are no new results to review at this time.  Assessment and Plan: Diarrhea, unspecified type Patient  endorsed watery diarrhea which was possibly due to chemotherapy, since she usually have diarrhea after each chemo session. 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 Albumin 3.0; Protein supplement will be provided Continue fall precaution Continue PT/OT eval and treat  Abdominal pain/rectal pain Rectal pain was due to external hemorrhoids Continue Anusol , Tucks, Xylocaine  Continue IV Dilaudid  0.5 mg every 4 hours as needed  Obesity class I (BMI 31.45) Diet and lifestyle modification  ESRD on HD (MWF) Last dialysis was on Friday -10/24 Continue Sensipar  Nephrology will be consulted for maintenance of dialysis  Pancytopenia This is possibly due to chemotherapy WBC 0.8, consider oncology consult for evaluation and recommendation Continue neutropenic precautions  Macrocytic anemia MCV 113.3, hemoglobin 8.8 Vitamin B12 and folate levels will be checked  Chronic respiratory failure with hypoxia Continue supplemental oxygen  per home regimen Continue breathing treatment per home regimen  GERD Continue Protonix   Chronic diastolic heart failure Continue volume management with hemodialysis.   Monitor daily weights   Essential hypertension Hold home meds due to soft BP   Mixed hyperlipidemia Continue Lipitor   History of nonobstructive coronary artery disease Continue aspirin  and Lipitor     Advance Care Planning:   Code Status: Prior full code  Consults: None  Family Communication: Husband at bedside (all questions answered to satisfaction)  Severity of Illness: The appropriate patient status for this patient is INPATIENT. Inpatient status is judged to be reasonable and necessary in order to provide the required intensity of service  to ensure the patient's safety. The patient's presenting symptoms, physical  exam findings, and initial radiographic and laboratory data in the context of their chronic comorbidities is felt to place them at high risk for further clinical deterioration. Furthermore, it is not anticipated that the patient will be medically stable for discharge from the hospital within 2 midnights of admission.   * I certify that at the point of admission it is my clinical judgment that the patient will require inpatient hospital care spanning beyond 2 midnights from the point of admission due to high intensity of service, high risk for further deterioration and high frequency of surveillance required.*  Author: Thaily Hackworth, DO 06/08/2024 3:39 AM  For on call review [email or web address].

## 2024-06-08 NOTE — TOC Initial Note (Signed)
 Transition of Care [facility]) - Initial/Assessment Note    Patient Details  Name: Megan [name] MRN: [id number] Date of Birth: May 01, 1961  Transition of Care [facility]) CM/SW Contact:    Hoy DELENA Bigness, LCSW Phone Number: 06/08/2024, 8:36 AM  Clinical Narrative:                 Pt assessed due to high risk for readmission. Pt from home with spouse. Pt actively receiving chemotherapy and HD. Pt is on 2L O2 at baseline provided by [employer]. Pt's spouse is very supportive of pt's care and provides transportation to appointments. ICM will continue to follow for discharge planning needs.   Expected Discharge Plan: Home/Self Care Barriers to Discharge: Continued Medical Work up   Patient Goals and CMS Choice Patient states their goals for this hospitalization and ongoing recovery are:: To get better and return home          Expected Discharge Plan and Services In-house Referral: Clinical Social Work Discharge Planning Services: NA Post Acute Care Choice: Resumption of Svcs/PTA Provider Living arrangements for the past 2 months: Single Family Home                                      Prior Living Arrangements/Services Living arrangements for the past 2 months: Single Family Home Lives with:: Spouse Patient language and need for interpreter reviewed:: Yes Do you feel safe going back to the place where you live?: Yes      Need for Family Participation in Patient Care: Yes (Comment) Care giver support system in place?: Yes (comment) Current home services: DME (O2 w/ [employer]) Criminal Activity/Legal Involvement Pertinent to Current Situation/Hospitalization: No - Comment as needed  Activities of Daily Living   ADL Screening (condition at time of admission) Independently performs ADLs?: No Does the patient have a NEW difficulty with bathing/dressing/toileting/self-feeding that is expected to last >3 days?: Yes (Initiates electronic notice to provider for possible OT  consult) Does the patient have a NEW difficulty with getting in/out of bed, walking, or climbing stairs that is expected to last >3 days?: Yes (Initiates electronic notice to provider for possible PT consult) Does the patient have a NEW difficulty with communication that is expected to last >3 days?: No Is the patient deaf or have difficulty hearing?: No Does the patient have difficulty seeing, even when wearing glasses/contacts?: No Does the patient have difficulty concentrating, remembering, or making decisions?: No  Permission Sought/Granted Permission sought to share information with : Family Supports Permission granted to share information with : Yes, Verbal Permission Granted  Share Information with NAME: Gil,James (Spouse)  [phone]           Emotional Assessment Appearance:: Appears stated age     Orientation: : Oriented to Self, Oriented to Place, Oriented to  Time, Oriented to Situation, Fluctuating Orientation (Suspected and/or reported Sundowners) Alcohol / Substance Use: Not Applicable Psych Involvement: No (comment)  Admission diagnosis:  Diarrhea with dehydration [R19.7] Diarrhea, unspecified type [R19.7] Patient Active Problem List   Diagnosis Date Noted   Diarrhea with dehydration 06/08/2024   Cancer associated pain 05/26/2024   Postobstructive pneumonia 05/23/2024   Volume overload 05/12/2024   Nausea with vomiting 05/06/2024   Peripheral neuropathy 04/08/2024   Right-sided chest wall pain 04/08/2024   Osteopenia 04/08/2024   Genetic testing 03/26/2024   Mucosal abnormality of stomach 12/31/2023   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 difficile colitis 02/15/2021    Dizziness    Breast mass, right    History of right breast cancer    Lobar pneumonia, unspecified organism 07/03/2020   Pleural effusion, right    S/P left mastectomy 06/15/2020   Allergy, unspecified, initial encounter 03/03/2020   Anaphylactic shock, unspecified, initial encounter 03/03/2020   Breast cancer (HCC) 02/18/2020   History of modified radical mastectomy of right breast 02/18/2020   S/P mastectomy, right 02/18/2020   ESRD (end stage renal disease) on dialysis [facility])    Prolapsed internal hemorrhoids, grade 3 07/31/2019   Fluid overload, unspecified 05/24/2019   Anaphylactic reaction due to adverse effect of correct drug or medicament properly administered, initial encounter 04/30/2019   Unspecified protein-calorie malnutrition 03/03/2019   Hypertensive heart disease with heart failure (HCC) 03/01/2019   Atherosclerotic heart disease of native coronary artery without angina pectoris 03/01/2019   Chest pain 02/28/2019   Chest pain with high risk for cardiac etiology 02/28/2019   Elevated troponin 02/03/2019   ESRD (end stage renal disease) (HCC) 02/03/2019   Acute diastolic CHF (congestive heart failure) (HCC) 02/03/2019   Hypokalemia 02/03/2019   Acute respiratory failure with hypoxia (HCC) 02/03/2019   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)    Hypocalcemia 09/03/2018   Hypothyroidism, unspecified 09/21/2017   Anemia in chronic kidney disease 04/12/2017   Iron  deficiency anemia, unspecified 04/12/2017   Abnormal results of kidney function studies 04/11/2017   Acidosis 04/11/2017   Anemia, unspecified 04/11/2017   Coagulation defect, unspecified 04/11/2017   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, with stage 5 chronic kidney disease, or end stage renal disease (HCC) 04/11/2017   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 04/11/2017   Other disorders of phosphorus metabolism 04/11/2017    Pain, unspecified 04/11/2017   Proteinuria, unspecified 04/11/2017   Pruritus, unspecified 04/11/2017   Unspecified kidney failure 04/11/2017   Encounter for prophylactic removal of breast 04/04/2017   Rectal bleeding 06/08/2016   Vaginal discharge 12/30/2015   Dyspepsia 11/26/2013   GERD (gastroesophageal reflux disease)    Type 2 diabetes mellitus (HCC)    CKD (chronic kidney disease) stage 3, GFR 30-59 ml/min (HCC)    Diarrhea 08/01/2011   External hemorrhoid 08/01/2011   Hematochezia 07/13/2011   Dyspnea 04/21/2010   Atypical chest pain 04/21/2010   Mixed hyperlipidemia 03/11/2008   OBESITY 03/11/2008   Essential hypertension, benign 03/11/2008   PCP:  Duanne Butler DASEN, MD Pharmacy:   [facility] [place], [place] - [address] [place] [address] Phone: [phone] Fax: [phone]  [facility] [phone] - [place],  - [address] AT [facility] OF [address] [place] [address] Phone: [phone] Fax: [phone]     Social Drivers of Health (SDOH) Social History: SDOH Screenings   Food Insecurity: No Food Insecurity (06/08/2024)  Housing: Low Risk  (06/08/2024)  Transportation Needs: No Transportation Needs (06/08/2024)  Utilities: Not At Risk (06/08/2024)  Alcohol Screen: Low Risk  (09/20/2023)  Depression (PHQ2-9): Low Risk  (06/05/2024)  Financial Resource Strain: Low Risk  (09/20/2023)  Physical Activity: Inactive (09/20/2023)  Social Connections: Unknown (06/08/2024)  Stress:  No Stress Concern Present (09/20/2023)  Tobacco Use: Low Risk  (06/08/2024)   SDOH Interventions:     Readmission Risk Interventions    06/08/2024    8:34 AM 05/24/2024    2:23 PM 05/15/2024    3:20 PM  Readmission Risk Prevention Plan  Transportation Screening Complete Complete Complete  Medication Review Oceanographer) Complete Complete Complete  PCP or Specialist appointment within 3-5 days of  discharge Complete Not Complete   HRI or Home Care Consult Complete Complete   SW Recovery Care/Counseling Consult Complete Complete Complete  Palliative Care Screening Not Applicable Not Applicable Not Applicable  Skilled Nursing Facility Not Applicable Not Applicable Not Applicable

## 2024-06-08 NOTE — Progress Notes (Signed)
   06/08/24 1243  Vitals  Temp 98.9 F (37.2 C)  Temp Source Oral  BP (!) 83/40  MAP (mmHg) (!) 53  BP Location Right Arm  BP Method Automatic  Patient Position (if appropriate) Lying  Pulse Rate Source Dinamap  Resp 20  Level of Consciousness  Level of Consciousness Alert  MEWS COLOR  MEWS Score Color Yellow  Oxygen  Therapy  SpO2 95 %  O2 Device Nasal Cannula  O2 Flow Rate (L/min) 2 L/min  Pain Assessment  Pain Scale Faces  Pain Score Asleep  MEWS Score  MEWS Temp 0  MEWS Systolic 1  MEWS Pulse 1  MEWS RR 0  MEWS LOC 0  MEWS Score 2   B/P as above. Pt sleeping, awakens to name called, still only oriented to person. MD Vicci notified of above VS and pt condition, orders received to move pt to step down unit for further care. Pt's husband updated on plan of care, questions answered to his satisfaction. AC notified of bed need.

## 2024-06-08 NOTE — ED Provider Notes (Signed)
 [facility] EMERGENCY DEPARTMENT AT [facility] Provider Note   CSN: [id number] Arrival date & time: 06/08/24  0126     Patient presents with: Abdominal Pain and Diarrhea   Megan [name] is a 63 y.o. female.   Patient presents to the emergency department for evaluation of severe diarrhea.  Patient reports that she is receiving chemotherapy for metastatic breast cancer.  She received her third dose of chemotherapy 2 days ago.  She has hide severe watery diarrhea and is experiencing significant rectal pain secondary to hemorrhoids.  She has been using topical medication for the hemorrhoids without improvement.       Prior to Admission medications   Medication Sig Start Date End Date Taking? Authorizing Provider  acetaminophen  (TYLENOL ) 500 MG tablet Take 1,000 mg by mouth every 8 (eight) hours as needed for moderate pain (pain score 4-6).    [provider]  albuterol  (PROVENTIL ) (2.5 MG/3ML) 0.083% nebulizer solution Take 3 mLs (2.5 mg total) by nebulization every 4 (four) hours as needed for wheezing or shortness of breath. 05/15/24 05/15/25  Pearlean Manus, MD  albuterol  (VENTOLIN  HFA) 108 (90 Base) MCG/ACT inhaler Inhale 2 puffs into the lungs every 6 (six) hours as needed for wheezing or shortness of breath. 05/15/24   Pearlean Manus, MD  aspirin  EC 81 MG tablet Take 81 mg by mouth daily.    [provider]  atorvastatin  (LIPITOR) 20 MG tablet TAKE 1 TABLET(20 MG) BY MOUTH DAILY 02/11/24   Duanne Butler DASEN, MD  B Complex-C-Zn-Folic Acid  (DIALYVITE  800-ZINC  15) 0.8 MG TABS Take 1 tablet by mouth daily. 06/19/22   [provider]  benzonatate  (TESSALON ) 100 MG capsule Take 1 capsule (100 mg total) by mouth 3 (three) times daily. 05/29/24 06/28/24  Davonna Siad, MD  chlorpheniramine-HYDROcodone  (TUSSIONEX) 10-8 MG/5ML Take 5 mLs by mouth every 12 (twelve) hours as needed (Severe coughing spells.). 05/26/24   Ricky Fines, MD  cinacalcet   (SENSIPAR ) 90 MG tablet Take 90 mg by mouth daily. 03/17/24   [provider]  Darbepoetin Alfa  (ARANESP , ALBUMIN FREE, IJ) Darbepoetin Alfa  (Aranesp ) 07/06/23 07/04/24  [provider]  dexamethasone  (DECADRON ) 4 MG tablet Take 2 tablets (8 mg) daily for 3 days. Start the day after chemotherapy. 05/29/24   Kandala, Hyndavi, MD  diphenoxylate -atropine  (LOMOTIL ) 2.5-0.025 MG tablet Take 1 tablet by mouth 4 (four) times daily as needed for diarrhea or loose stools. 05/06/24   Davonna Siad, MD  fentaNYL  (DURAGESIC ) 50 MCG/HR Place 1 patch onto the skin every 3 (three) days. 06/05/24   Kandala, Hyndavi, MD  fluconazole  (DIFLUCAN ) 100 MG tablet Take 1 tablet (100 mg total) by mouth every other day. 06/05/24   Kandala, Hyndavi, MD  gabapentin  (NEURONTIN ) 300 MG capsule TAKE 1 CAPSULE(300 MG) BY MOUTH AT BEDTIME 06/12/23   Duanne Butler DASEN, MD  guaiFENesin -codeine  100-10 MG/5ML syrup Take 5 mLs by mouth every 12 (twelve) hours as needed for cough. 06/03/24   Kandala, Hyndavi, MD  isosorbide  mononitrate (IMDUR ) 30 MG 24 hr tablet Take 0.5 tablets (15 mg total) by mouth daily. 03/28/24   Alvan Dorn FALCON, MD  lanthanum  (FOSRENOL ) 1000 MG chewable tablet Chew 2,000-3,000 mg by mouth See admin instructions. Take 3 tablets (3000 mg) by mouth with meals and take 2 tablets (2000 mg) with snacks 10/16/19   [provider]  lidocaine  (XYLOCAINE ) 2 % solution Use as directed 15 mLs in the mouth or throat every 4 (four) hours as needed for mouth pain (Mix 1:1  with Maalox and swish and spit). 05/22/24   Kandala, Hyndavi, MD  lidocaine -prilocaine  (EMLA ) cream Apply to affected area once 05/29/24   Kandala, Hyndavi, MD  loperamide  (IMODIUM  A-D) 2 MG tablet Take 2 mg by mouth 4 (four) times daily as needed for diarrhea or loose stools.    [provider]  loperamide  (IMODIUM ) 2 MG capsule Take 2 tabs by mouth with first loose stool, then 1 tab with each additional loose stool as needed. Do  not exceed 8 tabs in a 24-hour period 05/29/24   Davonna Siad, MD  nebivolol  (BYSTOLIC ) 10 MG tablet TAKE 1 TABLET BY MOUTH IN THE MORNING AND AT BEDTIME 02/19/24   Duanne Butler DASEN, MD  Nebulizers (COMPRESSOR/NEBULIZER) MISC 1 Units by Does not apply route daily as needed. 05/15/24   Pearlean Manus, MD  nystatin (MYCOSTATIN) 100000 UNIT/ML suspension Take 5 mLs (500,000 Units total) by mouth 4 (four) times daily for 7 days. 06/03/24 06/10/24  Kandala, Hyndavi, MD  OLANZapine  (ZYPREXA ) 5 MG tablet Take 1 tablet (5 mg total) by mouth at bedtime. 05/06/24   Kandala, Hyndavi, MD  ondansetron  (ZOFRAN ) 8 MG tablet Take 1 tablet (8 mg total) by mouth every 8 (eight) hours as needed for nausea or vomiting. Start on the third day after chemotherapy. 05/29/24   Davonna Siad, MD  ondansetron  (ZOFRAN -ODT) 4 MG disintegrating tablet Take 1 tablet by mouth every 8 (eight) hours as needed for vomiting or nausea. 04/23/24   [provider]  oxyCODONE  (OXY IR/ROXICODONE ) 5 MG immediate release tablet Take 1 tablet (5 mg total) by mouth every 4 (four) hours as needed for moderate pain (pain score 4-6) or breakthrough pain. 05/26/24   Ricky Fines, MD  pantoprazole  (PROTONIX ) 40 MG tablet Take 1 tablet (40 mg total) by mouth 2 (two) times daily before a meal. 10/08/23   Rourk, Lamar HERO, MD  phenol (CHLORASEPTIC) 1.4 % LIQD Use as directed 1 spray in the mouth or throat as needed for throat irritation / pain. 05/26/24   Ricky Fines, MD  predniSONE  (DELTASONE ) 20 MG tablet Take 2 tablets by mouth daily x 2 days; then 1 tablet by mouth daily x 3 days; then half tablet by mouth daily x 3 days and stop prednisone . 05/26/24   Ricky Fines, MD  prochlorperazine  (COMPAZINE ) 10 MG tablet Take 1 tablet (10 mg total) by mouth every 6 (six) hours as needed for nausea or vomiting. 05/29/24   Davonna Siad, MD    Allergies: Tana jude govitecan-hziy] and Motrin [ibuprofen]    Review of  Systems  Updated Vital Signs BP 106/63   Pulse 99   Temp (!) 97.5 F (36.4 C) (Oral)   Resp 20   Ht 5' 2 (1.575 m)   Wt 78 kg   SpO2 96%   BMI 31.45 kg/m   Physical Exam Vitals and nursing note reviewed.  Constitutional:      General: She is not in acute distress.    Appearance: She is well-developed.  HENT:     Head: Normocephalic and atraumatic.     Mouth/Throat:     Mouth: Mucous membranes are moist.  Eyes:     General: Vision grossly intact. Gaze aligned appropriately.     Extraocular Movements: Extraocular movements intact.     Conjunctiva/sclera: Conjunctivae normal.  Cardiovascular:     Rate and Rhythm: Normal rate and regular rhythm.     Pulses: Normal pulses.     Heart sounds: Normal heart sounds, S1 normal and S2  normal. No murmur heard.    No friction rub. No gallop.  Pulmonary:     Effort: Pulmonary effort is normal. No respiratory distress.     Breath sounds: Normal breath sounds.  Abdominal:     General: Bowel sounds are normal.     Palpations: Abdomen is soft.     Tenderness: There is no abdominal tenderness. There is no guarding or rebound.     Hernia: No hernia is present.  Genitourinary:    Rectum: External hemorrhoid (Multiple, inflamed, none are thrombosed) present.  Musculoskeletal:        General: No swelling.     Cervical back: Full passive range of motion without pain, normal range of motion and neck supple. No spinous process tenderness or muscular tenderness. Normal range of motion.     Right lower leg: No edema.     Left lower leg: No edema.  Skin:    General: Skin is warm and dry.     Capillary Refill: Capillary refill takes less than 2 seconds.     Findings: No ecchymosis, erythema, rash or wound.  Neurological:     General: No focal deficit present.     Mental Status: She is alert and oriented to person, place, and time.     GCS: GCS eye subscore is 4. GCS verbal subscore is 5. GCS motor subscore is 6.     Cranial Nerves: Cranial  nerves 2-12 are intact.     Sensory: Sensation is intact.     Motor: Motor function is intact.     Coordination: Coordination is intact.  Psychiatric:        Attention and Perception: Attention normal.        Mood and Affect: Mood normal.        Speech: Speech normal.        Behavior: Behavior normal.     (all labs ordered are listed, but only abnormal results are displayed) Labs Reviewed  CBC WITH DIFFERENTIAL/PLATELET - Abnormal; Notable for the following components:      Result Value   WBC 0.8 (*)    RBC 2.49 (*)    Hemoglobin 8.8 (*)    HCT 28.2 (*)    MCV 113.3 (*)    MCH 35.3 (*)    RDW 17.0 (*)    Platelets 129 (*)    Neutro Abs 0.2 (*)    Lymphs Abs 0.3 (*)    All other components within normal limits  COMPREHENSIVE METABOLIC PANEL WITH GFR - Abnormal; Notable for the following components:   Chloride 96 (*)    Glucose, Bld 135 (*)    BUN 75 (*)    Creatinine, Ser 8.97 (*)    Calcium  8.2 (*)    Total Protein 5.6 (*)    Albumin 3.0 (*)    GFR, Estimated 5 (*)    Anion gap 20 (*)    All other components within normal limits  LIPASE, BLOOD    EKG: None  Radiology: CT ABDOMEN PELVIS W CONTRAST Result Date: 06/08/2024 EXAM: CT ABDOMEN AND PELVIS WITH CONTRAST 06/08/2024 03:05:08 AM TECHNIQUE: CT of the abdomen and pelvis was performed with the administration of 100 mL of iohexol  (OMNIPAQUE ) 300 MG/ML solution. Multiplanar reformatted images are provided for review. Automated exposure control, iterative reconstruction, and/or weight-based adjustment of the mA/kV was utilized to reduce the radiation dose to as low as reasonably achievable. COMPARISON: PET CT dated 05/22/2024. CLINICAL HISTORY: Abdominal pain, acute, nonlocalized. Pt with c/o abdominal cramping  and loose stools (5) since Chemo on Thurs and dialysis on Fri. Husband states pt has had 2 Imodium  prior to arrival. FINDINGS: LOWER CHEST: Innumerable pulmonary metastases, similar to recent PET. 15 mm short axis  left axillary nodal metastasis (image 1), incompletely visualized, grossly unchanged. LIVER: 14 mm inferior right hepatic metastasis (image 19), unchanged. GALLBLADDER AND BILE DUCTS: Status post cholecystectomy. No biliary ductal dilatation. SPLEEN: No acute abnormality. PANCREAS: No acute abnormality. ADRENAL GLANDS: No acute abnormality. KIDNEYS, URETERS AND BLADDER: Severe bilateral renal cortical scarring/atrophy. Numerous bilateral renal calculi measuring up to 7 mm on the right. Small simple bilateral renal cysts measuring up to 15 mm on the left, benign. Per consensus, no follow-up is needed for simple Bosniak type 1 and 2 renal cysts. No hydronephrosis. No perinephric or periureteral stranding. Urinary bladder is unremarkable. GI AND BOWEL: Stomach demonstrates no acute abnormality. There is no bowel obstruction. PERITONEUM AND RETROPERITONEUM: No ascites. No free air. VASCULATURE: Atherosclerotic calcifications of the abdominal aorta and branch vessels, although patent. Aorta is normal in caliber. LYMPH NODES: No lymphadenopathy. REPRODUCTIVE ORGANS: Status post hysterectomy. BONES AND SOFT TISSUES: No acute osseous abnormality. No focal soft tissue abnormality. IMPRESSION: 1. No acute findings. 2. Innumerable pulmonary metastases, left axillary nodal metastasis, and inferior right hepatic metastasis, similar to recent PET. 3. Additional ancillary findings, as above. Electronically signed by: Pinkie Pebbles MD 06/08/2024 03:12 AM EDT RP Workstation: [id number]     Procedures   Medications Ordered in the ED  lidocaine  (XYLOCAINE ) 2 % jelly 1 Application (has no administration in time range)  hydrocortisone  (ANUSOL -HC) 2.5 % rectal cream (has no administration in time range)  witch hazel-glycerin (TUCKS) pad (has no administration in time range)  HYDROmorphone  (DILAUDID ) injection 1 mg (has no administration in time range)  HYDROmorphone  (DILAUDID ) injection 1 mg (1 mg Intravenous Given 06/08/24  0202)  ondansetron  (ZOFRAN ) injection 4 mg (4 mg Intravenous Given 06/08/24 0201)  sodium chloride  0.9 % bolus 500 mL (500 mLs Intravenous New Bag/Given 06/08/24 0201)  iohexol  (OMNIPAQUE ) 300 MG/ML solution 100 mL (100 mLs Intravenous Contrast Given 06/08/24 0253)                                    Medical Decision Making Amount and/or Complexity of Data Reviewed Labs: ordered. Radiology: ordered.  Risk Prescription drug management.   Presents with diarrhea secondary to chemotherapy.  Patient has been following her fluid restrictions secondary to dialysis, however has been losing a lot of fluids and her diarrhea.  She appears weak.  She reports that she is having cramping of the abdomen associated with the diarrhea and is experiencing severe rectal pain.  Examination reveals multiple large inflamed hemorrhoids but no thrombosis.  CT scan does not show any blockage or inflammatory process.  Blood work reveals pancytopenia with predominant leukopenia.  This is secondary to the chemotherapy.  Remainder of labs are consistent with end-stage renal disease, no other acute problems.  Patient will require hospitalization for pain control.  She has not been able to to manage her pain at home with her oral oxycodone .     Final diagnoses:  Diarrhea, unspecified type    ED Discharge Orders     None          Haze Lonni PARAS, MD 06/08/24 [phone]

## 2024-06-08 NOTE — Hospital Course (Signed)
 63 y.o. female with medical history significant of hypertension, hyperlipidemia,  chronic diastolic heart failure, anemia of chronic disease, ESRD, GERD, COPD with chronic respiratory failure and hypoxia on 2 LPM of oxygen  at baseline, class I obesity, history of nonobstructive coronary artery disease and metastatic breast cancer who presents to the emergency department for evaluation of diarrhea.  She had her third dose of chemotherapy 2 days ago, diarrhea started yesterday, this was associated with rectal pain due to external hemorrhoids which was not responsive to home topical medication.  Imodium  used 2-3 times at home is yet to improve diarrhea.  She also complained of diffuse abdominal pain.  She denied any fever, chills, nausea, vomiting.   ED Course: In the emergency department, temperature was 97.5F, pulse 106 bpm, O2 sats 91-98% on 2 LPM of oxygen .  Workup in the ED showed WBC 0.8, hemoglobin 8.8, hematocrit 22.2, MCV 113.3, platelets 129.  BMP showed sodium 140, potassium 4.4, chloride 96, bicarb 24, blood glucose 135, BUN 75, creatinine 0.97, albumin 3.0, lipase 24.  CT abdomen and pelvis with contrast showed no acute findings.  Innumerable pulmonary metastasis, left axillary nodal metastasis and inferior right hepatic metastasis, similar to recent PET. IV Dilaudid  was given due to pain, Zofran  was given and IV NS 500 mL was provided.

## 2024-06-08 NOTE — ED Triage Notes (Signed)
 Pt with c/o abdominal cramping and loose stools (5) since Chemo on Thurs and dialysis on Fri. Husband states pt has had 2 Imodium  prior to arrival.

## 2024-06-08 NOTE — Progress Notes (Signed)
   06/08/24 1032  Vitals  Temp 99.5 F (37.5 C)  Temp Source Oral  BP (!) 86/46  MAP (mmHg) (!) 59  BP Location Right Arm  BP Method Automatic  Patient Position (if appropriate) Lying  Pulse Rate (!) 112  Pulse Rate Source Dinamap  Resp 16  Level of Consciousness  Level of Consciousness Alert  MEWS COLOR  MEWS Score Color Yellow  Oxygen  Therapy  SpO2 95 %  O2 Device Nasal Cannula  O2 Flow Rate (L/min) 2 L/min  Pain Assessment  Pain Scale Faces  Faces Pain Scale 0  MEWS Score  MEWS Temp 0  MEWS Systolic 1  MEWS Pulse 2  MEWS RR 0  MEWS LOC 0  MEWS Score 3   Pt awake, continues to respond appropriately to questions regarding name and DOB and follow commands. When asked if she wanted water , pt responds, No thank you., but any other question asked she just stares at staff. Temp down, pt appears more comfortable and sister states that she did take a nap. Phlebotomist in room to collect ordered blood samples, pt with very poor vein selection. VS as above. Skin warm and dry, color appropriate. No resp difficulty or distress noted. IVF bolus started per order. MD Vicci updated on current VS and condition.

## 2024-06-09 DIAGNOSIS — I959 Hypotension, unspecified: Secondary | ICD-10-CM | POA: Diagnosis not present

## 2024-06-09 DIAGNOSIS — A419 Sepsis, unspecified organism: Secondary | ICD-10-CM

## 2024-06-09 DIAGNOSIS — R197 Diarrhea, unspecified: Secondary | ICD-10-CM | POA: Diagnosis not present

## 2024-06-09 DIAGNOSIS — R6521 Severe sepsis with septic shock: Secondary | ICD-10-CM

## 2024-06-09 LAB — CBC WITH DIFFERENTIAL/PLATELET
Basophils Absolute: 0 K/uL (ref 0.0–0.1)
Basophils Relative: 0 %
Eosinophils Absolute: 0 K/uL (ref 0.0–0.5)
Eosinophils Relative: 0 %
HCT: 23.4 % — ABNORMAL LOW (ref 36.0–46.0)
Hemoglobin: 7.3 g/dL — ABNORMAL LOW (ref 12.0–15.0)
Lymphocytes Relative: 76 %
Lymphs Abs: 0.2 K/uL — ABNORMAL LOW (ref 0.7–4.0)
MCH: 35.4 pg — ABNORMAL HIGH (ref 26.0–34.0)
MCHC: 31.2 g/dL (ref 30.0–36.0)
MCV: 113.6 fL — ABNORMAL HIGH (ref 80.0–100.0)
Monocytes Absolute: 0 K/uL — ABNORMAL LOW (ref 0.1–1.0)
Monocytes Relative: 4 %
Neutro Abs: 0 K/uL — CL (ref 1.7–7.7)
Neutrophils Relative %: 20 %
Platelets: 94 K/uL — ABNORMAL LOW (ref 150–400)
RBC: 2.06 MIL/uL — ABNORMAL LOW (ref 3.87–5.11)
RDW: 17.1 % — ABNORMAL HIGH (ref 11.5–15.5)
WBC: 0.2 K/uL — CL (ref 4.0–10.5)
nRBC: 0 % (ref 0.0–0.2)

## 2024-06-09 LAB — COMPREHENSIVE METABOLIC PANEL WITH GFR
ALT: 14 U/L (ref 0–44)
AST: 15 U/L (ref 15–41)
Albumin: 2.6 g/dL — ABNORMAL LOW (ref 3.5–5.0)
Alkaline Phosphatase: 63 U/L (ref 38–126)
Anion gap: 23 — ABNORMAL HIGH (ref 5–15)
BUN: 87 mg/dL — ABNORMAL HIGH (ref 8–23)
CO2: 18 mmol/L — ABNORMAL LOW (ref 22–32)
Calcium: 8.5 mg/dL — ABNORMAL LOW (ref 8.9–10.3)
Chloride: 97 mmol/L — ABNORMAL LOW (ref 98–111)
Creatinine, Ser: 10.4 mg/dL — ABNORMAL HIGH (ref 0.44–1.00)
GFR, Estimated: 4 mL/min — ABNORMAL LOW (ref >60)
Glucose, Bld: 90 mg/dL (ref 70–99)
Potassium: 5.5 mmol/L — ABNORMAL HIGH (ref 3.5–5.1)
Sodium: 138 mmol/L (ref 135–145)
Total Bilirubin: 0.3 mg/dL (ref 0.0–1.2)
Total Protein: 4.9 g/dL — ABNORMAL LOW (ref 6.5–8.1)

## 2024-06-09 LAB — HEPATITIS B SURFACE ANTIGEN: Hepatitis B Surface Ag: NONREACTIVE

## 2024-06-09 LAB — MRSA NEXT GEN BY PCR, NASAL: MRSA by PCR Next Gen: NOT DETECTED

## 2024-06-09 LAB — FOLATE: Folate: 16.1 ng/mL (ref >5.9)

## 2024-06-09 MED ORDER — LANTHANUM CARBONATE 500 MG PO CHEW
500.0000 mg | CHEWABLE_TABLET | Freq: Three times a day (TID) | ORAL | Status: DC
  Administered 2024-06-10: 500 mg via ORAL
  Filled 2024-06-09 (×10): qty 1

## 2024-06-09 MED ORDER — SODIUM CHLORIDE 0.9 % IV SOLN
2.2500 g | Freq: Three times a day (TID) | INTRAVENOUS | Status: DC
  Administered 2024-06-09 – 2024-06-11 (×6): 2.25 g via INTRAVENOUS
  Filled 2024-06-09 (×10): qty 10

## 2024-06-09 MED ORDER — HYDROMORPHONE HCL 1 MG/ML IJ SOLN
0.5000 mg | INTRAMUSCULAR | Status: DC | PRN
  Administered 2024-06-10 – 2024-06-11 (×4): 0.5 mg via INTRAVENOUS
  Filled 2024-06-09 (×4): qty 0.5

## 2024-06-09 MED ORDER — LIDOCAINE-PRILOCAINE 2.5-2.5 % EX CREA: 1.0000 | TOPICAL_CREAM | CUTANEOUS | Status: DC | PRN

## 2024-06-09 MED ORDER — OLANZAPINE 5 MG PO TABS
5.0000 mg | ORAL_TABLET | Freq: Every day | ORAL | Status: DC
  Administered 2024-06-09: 5 mg via ORAL
  Filled 2024-06-09 (×2): qty 1

## 2024-06-09 MED ORDER — NOREPINEPHRINE 4 MG/250ML-% IV SOLN
0.0000 ug/min | INTRAVENOUS | Status: DC
  Administered 2024-06-09: 2 ug/min via INTRAVENOUS

## 2024-06-09 MED ORDER — ALBUMIN HUMAN 25 % IV SOLN
INTRAVENOUS | Status: AC
  Filled 2024-06-09: qty 100

## 2024-06-09 MED ORDER — NYSTATIN 100000 UNIT/ML MT SUSP
5.0000 mL | Freq: Four times a day (QID) | OROMUCOSAL | Status: DC
  Administered 2024-06-09 – 2024-06-11 (×6): 500000 [IU] via ORAL
  Filled 2024-06-09 (×7): qty 5

## 2024-06-09 MED ORDER — HEPARIN SODIUM (PORCINE) 1000 UNIT/ML DIALYSIS: 1000.0000 [IU] | INTRAMUSCULAR | Status: DC | PRN

## 2024-06-09 MED ORDER — FLUCONAZOLE 100 MG PO TABS
100.0000 mg | ORAL_TABLET | ORAL | Status: DC
  Administered 2024-06-11: 100 mg via ORAL
  Filled 2024-06-09 (×2): qty 1

## 2024-06-09 MED ORDER — VANCOMYCIN HCL IN DEXTROSE 1-5 GM/200ML-% IV SOLN: 1000.0000 mg | INTRAVENOUS | Status: DC

## 2024-06-09 MED ORDER — LIDOCAINE HCL (PF) 1 % IJ SOLN
5.0000 mL | INTRAMUSCULAR | Status: DC | PRN
Start: 2024-06-09 — End: 2024-06-11

## 2024-06-09 MED ORDER — ALBUMIN HUMAN 25 % IV SOLN
25.0000 g | Freq: Once | INTRAVENOUS | Status: AC
  Administered 2024-06-09: 25 g via INTRAVENOUS

## 2024-06-09 MED ORDER — NOREPINEPHRINE 4 MG/250ML-% IV SOLN
0.0000 ug/min | INTRAVENOUS | Status: DC
  Filled 2024-06-09: qty 250

## 2024-06-09 MED ORDER — PENTAFLUOROPROP-TETRAFLUOROETH EX AERO
1.0000 | INHALATION_SPRAY | CUTANEOUS | Status: DC | PRN
  Administered 2024-06-09: 1 via TOPICAL
  Filled 2024-06-09: qty 30

## 2024-06-09 MED ORDER — ANTICOAGULANT SODIUM CITRATE 4% (200MG/5ML) IV SOLN: 5.0000 mL | Status: DC | PRN

## 2024-06-09 MED ORDER — PANTOPRAZOLE SODIUM 40 MG IV SOLR
40.0000 mg | Freq: Every evening | INTRAVENOUS | Status: DC
  Administered 2024-06-09: 40 mg via INTRAVENOUS
  Filled 2024-06-09: qty 10

## 2024-06-09 MED ORDER — SODIUM CHLORIDE 0.9 % IV BOLUS
500.0000 mL | Freq: Once | INTRAVENOUS | Status: AC
  Administered 2024-06-09: 500 mL via INTRAVENOUS

## 2024-06-09 MED ORDER — CHLORHEXIDINE GLUCONATE CLOTH 2 % EX PADS
6.0000 | MEDICATED_PAD | Freq: Every day | CUTANEOUS | Status: DC
  Administered 2024-06-09 – 2024-06-11 (×3): 6 via TOPICAL

## 2024-06-09 NOTE — Consult Note (Signed)
 ESRD Consult Note  Requesting provider: [facility]  Reason for consult: ESRD, provision of dialysis  Assessment/Recommendations:  ESRD -outpatient HD orders: [facility] MWF. F180. EDW 79.7kg.  aVF 15-gauge, flow rates: 400/800.  2K/2.5 calcium .  Heparin : 2100 units bolus.  Meds: Aranesp  160 mcg once a week (last dose 10/24), Venofer  50 mg once a week, calcitriol  1.5 mcg 3 times weekly -HD planned for today, if not tolerating then will need to consider transfer to [facility] for CRRT (hoping to avoid this) but if needed can do HD with pressor support  Septic shock -per primary -on broad spectrum abx -low threshold to start pressors  Neutropenia Neutropenic fevers -per primary service  Metastatic breast cancer Extensive pulmonary metastasis -consider involving hemonc and palliative care  Rectal pain Prolapsed hemorrhoids -pain mgmt per primary service  Diarrhea -supportive care, consider checking for Cdiff  Hyperkalemia -K 5.5, plan for HD today  Acidosis -secondary to ESRD and diarrhea, will attempt to manage with HD  FTT -secondary to malignancy and chemo -per primary  Volume/ history of hypertension  -Hold any BP meds. UF as hemodynamics tolerates, will likely not be able to pull much today given soft BPs  Anemia of Chronic Kidney Disease Pancytopenia Hemoglobin 8, holding ESA in the context of advancing malignancy and on chemo  -Transfuse PRN for Hgb <7  Secondary Hyperparathyroidism/Hyperphosphatemia - resume home binders, takes lanthanum  at home but utilizing a lower dose -renal diet  -on sensipar   # Additional recommendations: - Dose all meds for creatinine clearance < 10 ml/min  - Unless absolutely necessary, no MRIs with gadolinium.  - Implement save arm precautions.  Prefer needle sticks in the dorsum of the hands or wrists.  No blood pressure measurements in arm. - If blood transfusion is requested during hemodialysis sessions, please alert us  prior to the session.  -  If a hemodialysis catheter line culture is requested, please alert us  as only hemodialysis nurses are able to collect those specimens.   Recommendations were discussed with the primary team. Discussed with husband and ICU RN at the bedside.   The patient is critically ill with septic shock and which includes my role to primarily manage dialysis, volume status, hyperkalemia, acidosis.  This requires high complexity decision making.  Total critical care time: 15 min  Critical care time was exclusive of treating other patients.   Critical care was necessary to treat or prevent imminent or life-threatening deterioration.   Critical care was time spent personally by me on the following activities:   development of treatment plan with patient and/or surrogate as well as nursing,   discussions with other provider evaluation of patient's response to treatment  examination of patient  obtaining history from patient or surrogate  ordering and performing treatments and interventions  ordering and review of laboratory studies  ordering and review of radiographic studies   Ephriam Stank, MD [facility]  History of Present Illness: Megan [name] is a/an 63 y.o. female with a past medical history of ESRD on HD, chronic diastolic heart failure, metastatic breast cancer on chemo, hypertension, GERD, COPD on 2 L nasal cannula at home, GERD, hyperlipidemia, chronic anemia, obesity, nonobstructive CAD who presents with diarrhea.  Her last chemo was 2 days prior to admission, diarrhea started thereafter.  Associated with rectal pain due to external hemorrhoids.  BP has been as low as low as 75/40 which was overnight.  Now up to 106/55 She did have chest x-ray done yesterday which showed extensive pulmonary metastasis  which seems to be progressive in nature. Patient seen and examined bedside. Husband at bedside providing collateral data. 3 episodes of diarrhea overnight, 5 the day  prior. Port-a-cath has been accessed in case she needs levophed especially with HD. No acute events otherwise. Of note, she has received some fluids since being here.  Medications:  Current Facility-Administered Medications  Medication Dose Route Frequency Provider Last Rate Last Admin   acetaminophen  (TYLENOL ) tablet 650 mg  650 mg Oral Q6H PRN Adefeso, Oladapo, DO       Or   acetaminophen  (TYLENOL ) suppository 650 mg  650 mg Rectal Q6H PRN Adefeso, Oladapo, DO   650 mg at 06/08/24 2011   albuterol  (PROVENTIL ) (2.5 MG/3ML) 0.083% nebulizer solution 2.5 mg  2.5 mg Nebulization Q4H PRN Adefeso, Oladapo, DO   2.5 mg at 06/08/24 9262   aspirin  EC tablet 81 mg  81 mg Oral Daily Adefeso, Oladapo, DO       atorvastatin  (LIPITOR) tablet 20 mg  20 mg Oral Daily Adefeso, Oladapo, DO       ceFEPIme  (MAXIPIME ) 2 g in sodium chloride  0.9 % 100 mL IVPB  2 g Intravenous Q M,W,F-HD Johnson, Clanford L, MD       Chlorhexidine  Gluconate Cloth 2 % PADS 6 each  6 each Topical Daily Johnson, Clanford L, MD   6 each at 06/08/24 1600   cinacalcet  (SENSIPAR ) tablet 90 mg  90 mg Oral QPM Adefeso, Oladapo, DO       feeding supplement (ENSURE PLUS HIGH PROTEIN) liquid 237 mL  237 mL Oral BID BM Adefeso, Oladapo, DO       fentaNYL  (DURAGESIC ) 50 MCG/HR 1 patch  1 patch Transdermal Q72H Johnson, Clanford L, MD   1 patch at 06/08/24 1925   heparin  injection 5,000 Units  5,000 Units Subcutaneous Q8H Adefeso, Oladapo, DO   5,000 Units at 06/09/24 0556   hydrocortisone  (ANUSOL -HC) 2.5 % rectal cream   Rectal QID Pollina, Christopher J, MD   Given at 06/08/24 2019   HYDROmorphone  (DILAUDID ) injection 0.5 mg  0.5 mg Intravenous Q4H PRN Adefeso, Oladapo, DO       lidocaine  (LMX) 4 % cream   Topical Q6H PRN Vicci, Clanford L, MD   1 Application at 06/08/24 1704   loperamide  (IMODIUM ) capsule 2 mg  2 mg Oral PRN Adefeso, Oladapo, DO   2 mg at 06/09/24 0250   metroNIDAZOLE  (FLAGYL ) IVPB 500 mg  500 mg Intravenous Q12H Johnson,  Clanford L, MD   Stopped at 06/09/24 0100   norepinephrine (LEVOPHED) 4mg  in (0.016 mg/mL) premix infusion  0-40 mcg/min Intravenous Titrated Mansy, Madison LABOR, MD   Held at 06/09/24 0210   ondansetron  (ZOFRAN ) tablet 4 mg  4 mg Oral Q6H PRN Adefeso, Oladapo, DO       Or   ondansetron  (ZOFRAN ) injection 4 mg  4 mg Intravenous Q6H PRN Adefeso, Oladapo, DO       pantoprazole  (PROTONIX ) EC tablet 40 mg  40 mg Oral BID AC Adefeso, Oladapo, DO       vancomycin  (VANCOREADY) IVPB 750 mg/150 mL  750 mg Intravenous Q M,W,F-HD Johnson, Clanford L, MD       witch hazel-glycerin (TUCKS) pad   Topical PRN Pollina, Lonni PARAS, MD         ALLERGIES Tana [sacituzumab govitecan-hziy] and Motrin [ibuprofen]  MEDICAL HISTORY Past Medical History:  Diagnosis Date   (HFpEF) heart failure with preserved ejection fraction (HCC)    a. 01/2019 Echo: EF  55-60%, mild conc LVH. DD.  Torn MV chordae.   Anemia    Atypical chest pain    a. 08/2018 MV: EF 59%, no ischemia; b. 02/2019 Cath: nonobs dzs.   Blood transfusion without reported diagnosis    Breast cancer (HCC) 10/12/2020   Cataract    ESRD (end stage renal disease) on dialysis [facility])    a. HD T, T, S   Essential hypertension, benign    GERD (gastroesophageal reflux disease)    Headache    Hemorrhoids    Mixed hyperlipidemia    Morbid obesity (HCC)    Non-obstructive CAD (coronary artery disease)    a. 02/2019 CathL LM nl, LAD 42m, LCX nl, RCA 25p, EF 55-65%.   PONV (postoperative nausea and vomiting)    Renal insufficiency    S/P colonoscopy 08/2009   Dr. Rollin: sessile polyp (benign lymphoid), large hemorrhoids, repeat 5-10 years   Temporal arteritis (HCC)    Type 2 diabetes mellitus (HCC)    Wears glasses      SOCIAL HISTORY Social History   Socioeconomic History   Marital status: Married    Spouse name: Not on file   Number of children: Not on file   Years of education: Not on file   Highest education level: 12th grade   Occupational History   Occupation: Systems Developer: [employer]  Tobacco Use   Smoking status: Never    Passive exposure: Never   Smokeless tobacco: Never  Vaping Use   Vaping status: Never Used  Substance and Sexual Activity   Alcohol use: No   Drug use: No   Sexual activity: Yes    Birth control/protection: Surgical  Other Topics Concern   Not on file  Social History Narrative   Works at [employer] in [place].    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   Also has to get items from high shelves-causes achy pain in shoulder area      Married.   Children are grown, out of house.   Social Drivers of Corporate Investment Banker Strain: Low Risk  (09/20/2023)   Overall Financial Resource Strain (CARDIA)    Difficulty of Paying Living Expenses: Not hard at all  Food Insecurity: No Food Insecurity (06/08/2024)   Hunger Vital Sign    Worried About Running Out of Food in the Last Year: Never true    Ran Out of Food in the Last Year: Never true  Transportation Needs: No Transportation Needs (06/08/2024)   PRAPARE - Administrator, Civil Service (Medical): No    Lack of Transportation (Non-Medical): No  Physical Activity: Inactive (09/20/2023)   Exercise Vital Sign    Days of Exercise per Week: 0 days    Minutes of Exercise per Session: 0 min  Stress: No Stress Concern Present (09/20/2023)   [employer] of Occupational Health - Occupational Stress Questionnaire    Feeling of Stress : Not at all  Social Connections: Unknown (06/08/2024)   Social Connection and Isolation Panel    Frequency of Communication with Friends and Family: Three times a week    Frequency of Social Gatherings with Friends and Family: Patient declined    Attends Religious Services: Patient declined    Active Member of Clubs or Organizations: Patient declined    Attends Banker Meetings: Patient declined    Marital Status: Married  Catering Manager  Violence: Not At Risk (06/08/2024)  Humiliation, Afraid, Rape, and Kick questionnaire    Fear of Current or Ex-Partner: No    Emotionally Abused: No    Physically Abused: No    Sexually Abused: No     FAMILY HISTORY Family History  Problem Relation Age of Onset   Hypertension Mother    Coronary artery disease Mother    Diabetes Mother    Heart attack Father    Hypertension Sister    Coronary artery disease Sister    Hypertension Brother    Colon cancer Brother    Heart attack Maternal Grandmother    Heart attack Maternal Grandfather    Heart attack Paternal Grandmother    Heart attack Paternal Grandfather    Hypertension Son    Heart attack Maternal Aunt    Hypertension Maternal Aunt    Diabetes Maternal Aunt    Heart attack Maternal Uncle    Hypertension Maternal Uncle    Diabetes Maternal Uncle    Heart attack Paternal Aunt    Hypertension Paternal Aunt    Diabetes Paternal Aunt    Heart attack Paternal Uncle    Hypertension Paternal Uncle    Diabetes Paternal Uncle      Review of Systems: 12 systems were reviewed and negative except per HPI  Physical Exam: Vitals:   06/09/24 0600 06/09/24 0700  BP: 117/65 (!) 103/52  Pulse: (!) 101 99  Resp: 16 12  Temp:    SpO2: 100% 99%   No intake/output data recorded.  Intake/Output Summary (Last 24 hours) at 06/09/2024 0725 Last data filed at 06/09/2024 0200 Gross per 24 hour  Intake 1189.75 ml  Output --  Net 1189.75 ml   General: ill/tired appearing HEENT: anicteric sclera, dry MMM CV: normal rate, no murmurs, no edema Lungs: bilateral chest rise, normal wob Abd: soft, non-tender, non-distended Skin: no visible lesions or rashes, right chest port accessed Neuro: normal speech, no gross focal deficits, awake/alert Dialysis access: LUE AVF +b/t  Test Results Reviewed Lab Results  Component Value Date   NA 138 06/09/2024   K 5.5 (H) 06/09/2024   CL 97 (L) 06/09/2024   CO2 18 (L) 06/09/2024   BUN 87  (H) 06/09/2024   CREATININE 10.40 (H) 06/09/2024   CALCIUM  8.5 (L) 06/09/2024   ALBUMIN 2.6 (L) 06/09/2024   PHOS 10.3 (H) 06/08/2024    I have reviewed relevant outside healthcare records

## 2024-06-09 NOTE — Progress Notes (Signed)
 Pt receives out-pt HD at [facility], MWF, 0530am chair time. Will continue to assist as needed.    Kathlyne Loud Dialysis Navigator [phone] [facility] [phone]

## 2024-06-09 NOTE — Procedures (Signed)
 Patient at bedside in ICU. Alert and oriented to self.  Informed consent signed and in chart.   Pt's left arm fitsula was cleansed and accessed with no issues. Pt started tx and received 25 grams of albumin for first hour of tx. Pt blood pressure was soft was consistent. Pt has no signs of distress noted.  TX duration:3.5hrs  Patient tolerated well.   Alert, without acute distress.  Hand-off given to patient's nurse.   Access used: L arm fitsula Access issues: none  Total UF removed: 1.5L Medication(s) given: Albumin given 25 g Post HD weight: 78.7kg Post HD VS: 103/48   Seva Chancy S [facility]

## 2024-06-09 NOTE — Plan of Care (Signed)
  Problem: Education: Goal: Knowledge of General Education information will improve Description: Including pain rating scale, medication(s)/side effects and non-pharmacologic comfort measures Outcome: Not Progressing   Problem: Health Behavior/Discharge Planning: Goal: Ability to manage health-related needs will improve Outcome: Progressing   Problem: Clinical Measurements: Goal: Ability to maintain clinical measurements within normal limits will improve Outcome: Progressing Goal: Will remain free from infection Outcome: Progressing Goal: Diagnostic test results will improve Outcome: Progressing Goal: Respiratory complications will improve Outcome: Progressing Goal: Cardiovascular complication will be avoided Outcome: Progressing   Problem: Activity: Goal: Risk for activity intolerance will decrease Outcome: Not Progressing   Problem: Nutrition: Goal: Adequate nutrition will be maintained Outcome: Not Progressing   Problem: Coping: Goal: Level of anxiety will decrease Outcome: Progressing   Problem: Elimination: Goal: Will not experience complications related to bowel motility Outcome: Progressing Goal: Will not experience complications related to urinary retention Outcome: Progressing   Problem: Pain Managment: Goal: General experience of comfort will improve and/or be controlled Outcome: Progressing   Problem: Safety: Goal: Ability to remain free from injury will improve Outcome: Progressing   Problem: Skin Integrity: Goal: Risk for impaired skin integrity will decrease Outcome: Progressing   Problem: Fluid Volume: Goal: Hemodynamic stability will improve Outcome: Progressing   Problem: Clinical Measurements: Goal: Diagnostic test results will improve Outcome: Progressing Goal: Signs and symptoms of infection will decrease Outcome: Progressing   Problem: Respiratory: Goal: Ability to maintain adequate ventilation will improve Outcome: Progressing

## 2024-06-09 NOTE — Procedures (Signed)
 I was present at this dialysis session. I have reviewed the session itself and made appropriate changes.  Toelrating treatment, not requiring any pressors, did receive albumin at the beginning of treatment, let's plan to hold albumin next treatment to see how she does. Husband would ideally like to take her home after HD on Wed.  Filed Weights   06/08/24 0459 06/09/24 0600 06/09/24 1215  Weight: 79.6 kg 81.4 kg 81.4 kg    Recent Labs  Lab 06/08/24 0205 06/09/24 0412  NA 140 138  K 4.4 5.5*  CL 96* 97*  CO2 24 18*  GLUCOSE 135* 90  BUN 75* 87*  CREATININE 8.97* 10.40*  CALCIUM  8.2* 8.5*  PHOS 10.3*  --     Recent Labs  Lab 06/08/24 0205 06/08/24 1131 06/09/24 0412  WBC 0.8* 0.3* 0.2*  NEUTROABS 0.2* 0.2* 0.0*  HGB 8.8* 8.0* 7.3*  HCT 28.2* 26.1* 23.4*  MCV 113.3* 115.5* 113.6*  PLT 129* 116* 94*    Scheduled Meds:  aspirin  EC  81 mg Oral Daily   Chlorhexidine  Gluconate Cloth  6 each Topical Daily   Chlorhexidine  Gluconate Cloth  6 each Topical Q0600   cinacalcet   90 mg Oral QPM   feeding supplement  237 mL Oral BID BM   fentaNYL   1 patch Transdermal Q72H   fluconazole   100 mg Oral QODAY   heparin   5,000 Units Subcutaneous Q8H   hydrocortisone    Rectal QID   lanthanum   500 mg Oral TID WC   nystatin  5 mL Oral QID   OLANZapine   5 mg Oral QHS   pantoprazole  (PROTONIX ) IV  40 mg Intravenous QPM   Continuous Infusions:  anticoagulant sodium citrate     piperacillin-tazobactam (ZOSYN)  IV     PRN Meds:.acetaminophen  **OR** acetaminophen , albuterol , anticoagulant sodium citrate, heparin , HYDROmorphone  (DILAUDID ) injection, lidocaine , lidocaine  (PF), lidocaine -prilocaine , loperamide , ondansetron  **OR** ondansetron  (ZOFRAN ) IV, pentafluoroprop-tetrafluoroeth, witch hazel-glycerin   Ephriam Stank, MD [facility] 06/09/2024, 4:04 PM

## 2024-06-09 NOTE — Consult Note (Signed)
 [facility] Consultation hematology/oncology  REFERRING PHYSICIAN: Afton Louder, MD  REASON FOR CONSULT: Neutropenic fever    HISTORY OF PRESENT ILLNESS:   Megan [name] is a 63 y.o. female with past medical history of metastatic triple negative breast cancer currently receiving sacituzumab govitecan (last infusion 06/05/2024).  She received G-CSF on 06/06/2024.  Patient presented to the ER with significant diarrhea and dehydration.  She also reported prolapse of hemorrhoids.  Patient also had a fever of 101.16F and was hypotensive.  She is pancytopenic from recent chemotherapy.  Patient is currently being treated for neutropenic fever with broad-spectrum antibiotics.  CT CAP was done at this time did not show any signs of consolidation/pneumonia.  Patient was seen at bedside during dialysis.  Patient was very somnolent and fatigued at the time of examination.  She did not report any complaints at the time.     MEDICATIONS: I have reviewed the patient's current medications.       PERFORMANCE STATUS: The patient's performance status is 2 - Symptomatic, <50% confined to bed  PHYSICAL EXAM: Most Recent Vital Signs: Blood pressure (!) 134/103, pulse 92, temperature 97.8 F (36.6 C), temperature source Oral, resp. rate 12, height 6' (1.829 m), weight 270 lb (122.5 kg), SpO2 91%.  GENERAL: Somnolent and falling asleep easily.  Alert and oriented x 3 LUNGS: Decreased breath sounds diffusely.  No wheezes or rails HEART: Tachycardic and no murmurs and no lower extremity edema ABDOMEN:abdomen soft, non-tender and normal bowel sounds Musculoskeletal:no cyanosis of digits and no clubbing     LABORATORY DATA:   Last CBC Lab Results  Component Value Date   WBC 0.2 (LL) 06/09/2024   HGB 7.3 (L) 06/09/2024   HCT 23.4 (L) 06/09/2024   MCV 113.6 (H) 06/09/2024   MCH 35.4 (H) 06/09/2024   RDW 17.1 (H) 06/09/2024   PLT 94 (L) 06/09/2024     Last metabolic panel Lab  Results  Component Value Date   GLUCOSE 90 06/09/2024   NA 138 06/09/2024   K 5.5 (H) 06/09/2024   CL 97 (L) 06/09/2024   CO2 18 (L) 06/09/2024   BUN 87 (H) 06/09/2024   CREATININE 10.40 (H) 06/09/2024   GFRNONAA 4 (L) 06/09/2024   CALCIUM  8.5 (L) 06/09/2024   PHOS 10.3 (H) 06/08/2024   PROT 4.9 (L) 06/09/2024   ALBUMIN 2.6 (L) 06/09/2024   BILITOT 0.3 06/09/2024   ALKPHOS 63 06/09/2024   AST 15 06/09/2024   ALT 14 06/09/2024   ANIONGAP 23 (H) 06/09/2024      RADIOGRAPHY:  DG Chest Port 1 View CLINICAL DATA:  Sepsis.  EXAM: PORTABLE CHEST 1 VIEW  COMPARISON:  Radiograph and CT 05/23/2024  FINDINGS: Right chest port remains in place. Extensive pulmonary metastasis again seen. There is overall increase in heterogeneous bilateral lung opacities. Stable heart size and mediastinal contours. Right pleural effusion on CT is not well demonstrated by radiograph.  IMPRESSION: Extensive pulmonary metastasis. Overall increase in heterogeneous bilateral lung opacities, progression in disease, superimposed infection or edema.  Electronically Signed   By: Andrea Gasman M.D.   On: 06/08/2024 13:53 CT ABDOMEN PELVIS W CONTRAST EXAM: CT ABDOMEN AND PELVIS WITH CONTRAST 06/08/2024 03:05:08 AM  TECHNIQUE: CT of the abdomen and pelvis was performed with the administration of 100 mL of iohexol  (OMNIPAQUE ) 300 MG/ML solution. Multiplanar reformatted images are provided for review. Automated exposure control, iterative reconstruction, and/or weight-based adjustment of the mA/kV was utilized to reduce the radiation dose to as low as  reasonably achievable.  COMPARISON: PET CT dated 05/22/2024.  CLINICAL HISTORY: Abdominal pain, acute, nonlocalized. Pt with c/o abdominal cramping and loose stools (5) since Chemo on Thurs and dialysis on Fri. Husband states pt has had 2 Imodium  prior to arrival.  FINDINGS:  LOWER CHEST: Innumerable pulmonary metastases, similar to recent  PET. 15 mm short axis left axillary nodal metastasis (image 1), incompletely visualized, grossly unchanged.  LIVER: 14 mm inferior right hepatic metastasis (image 19), unchanged.  GALLBLADDER AND BILE DUCTS: Status post cholecystectomy. No biliary ductal dilatation.  SPLEEN: No acute abnormality.  PANCREAS: No acute abnormality.  ADRENAL GLANDS: No acute abnormality.  KIDNEYS, URETERS AND BLADDER: Severe bilateral renal cortical scarring/atrophy. Numerous bilateral renal calculi measuring up to 7 mm on the right. Small simple bilateral renal cysts measuring up to 15 mm on the left, benign. Per consensus, no follow-up is needed for simple Bosniak type 1 and 2 renal cysts. No hydronephrosis. No perinephric or periureteral stranding. Urinary bladder is unremarkable.  GI AND BOWEL: Stomach demonstrates no acute abnormality. There is no bowel obstruction.  PERITONEUM AND RETROPERITONEUM: No ascites. No free air.  VASCULATURE: Atherosclerotic calcifications of the abdominal aorta and branch vessels, although patent. Aorta is normal in caliber.  LYMPH NODES: No lymphadenopathy.  REPRODUCTIVE ORGANS: Status post hysterectomy.  BONES AND SOFT TISSUES: No acute osseous abnormality. No focal soft tissue abnormality.  IMPRESSION: 1. No acute findings. 2. Innumerable pulmonary metastases, left axillary nodal metastasis, and inferior right hepatic metastasis, similar to recent PET. 3. Additional ancillary findings, as above.  Electronically signed by: Pinkie Pebbles MD 06/08/2024 03:12 AM EDT RP Workstation: [id number]      ASSESSMENT:  Patient is a 63 year old female with metastatic triple negative breast cancer currently on sacituzumab govitecan, admitted to the hospital diarrhea and neutropenic fever.  PLAN:   1.  Neutropenic fever Neutropenia likely secondary to immunosuppression from sacituzumab govitecan.  -Agree with broad-spectrum antibiotics - Will follow  blood culture results - Patient received G-CSF on 06/06/2024.  Counts expected to increase slowly.  2.  Diarrhea Likely secondary to sacituzumab govitecan.  - Use Imodium  4 mg as needed. - Continue Lomotil  as needed if not controlled with Imodium . - Can consider starting prednisone  if not controlled with both Imodium  and Lomotil .  3.  Metastatic breast cancer Currently on sacituzumab govitecan.  Received 2 doses so far.  - Will dose reduce to 5 mg/kg with next infusion. - CT CAP stable at this time. - Will repeat imaging after 3 months of treatment to assess for response.  Rest of the care per primary team. Thank you for taking care of the patient.  Please to reach out with any questions or concerns.  Mickiel Dry, MD Hematology/Oncology [facility] at [facility]

## 2024-06-09 NOTE — Progress Notes (Signed)
 PROGRESS NOTE   Megan [name]  FMW:[id number] DOB: 03/04/1961 DOA: 06/08/2024 PCP: Duanne Butler DASEN, MD   Chief Complaint  Patient presents with   Abdominal Pain   Diarrhea   Level of care: Stepdown  Brief Admission History:  63 y.o. female with medical history significant of hypertension, hyperlipidemia,  chronic diastolic heart failure, anemia of chronic disease, ESRD, GERD, COPD with chronic respiratory failure and hypoxia on 2 LPM of oxygen  at baseline, class I obesity, history of nonobstructive coronary artery disease and metastatic breast cancer who presents to the emergency department for evaluation of diarrhea.  She had her third dose of chemotherapy 2 days ago, diarrhea started yesterday, this was associated with rectal pain due to external hemorrhoids which was not responsive to home topical medication.  Imodium  used 2-3 times at home is yet to improve diarrhea.  She also complained of diffuse abdominal pain.  She denied any fever, chills, nausea, vomiting.   ED Course: In the emergency department, temperature was 97.66F, pulse 106 bpm, O2 sats 91-98% on 2 LPM of oxygen .  Workup in the ED showed WBC 0.8, hemoglobin 8.8, hematocrit 22.2, MCV 113.3, platelets 129.  BMP showed sodium 140, potassium 4.4, chloride 96, bicarb 24, blood glucose 135, BUN 75, creatinine 0.97, albumin 3.0, lipase 24.  CT abdomen and pelvis with contrast showed no acute findings.  Innumerable pulmonary metastasis, left axillary nodal metastasis and inferior right hepatic metastasis, similar to recent PET. IV Dilaudid  was given due to pain, Zofran  was given and IV NS 500 mL was provided.   Assessment and Plan:  Severe Neutropenia s/p recent chemotherapy Neutropenic Fever  Severe sepsis with septic shock  -- ordered blood cultures x 2-- NGTD -- BPs have improved after fluid bolus -- low threshold to start norepinephrine infusion if BP does not improve -- broad spectrum antibiotics started after blood  cultures obtained -- pt has received a dose of pegfilgrastim  on 06/06/24 thru the oncology center  -- hopefully her ANC will start to improve after 48 hours and continue to improve over next 9-12 days -- fortunately BPs have started to improve with IV fluid bolus given, follow closely  -- portable CXR ordered STAT to check for pneumonia -- indeterminate given extensive metastatic disease -- acetaminophen  ordered for fever  -- with fever and immunocompromised state, check GIP and C diff due to ongoing diarrhea  Neutropenia -- discussed with her oncologist Dr. Davonna who will see her today  Rectal Pain  Prolapsed hemorrhoids -- has been severe from prolapsed hemorrhoids -- pain is better controlled  -- topical treatments ordered for hemorrhoids -- IV pain meds ordered PRN and she currently is wearing a fentanyl  patch   Adult Failure to thrive -- pt does not seem to be tolerating chemo well -- remains full code  -- recent palliative consult and they were not willing to talk goals of care  Generalized Weakness -- supportive measures for now -- very low likelihood for meaningful recovery given advanced malignancy  ESRD on HD (MWF) -- consult to nephrology for inpatient HD on 10/27 if BP can tolerate  Chronic hypoxic respiratory failure -- continue supplemental oxygen  support  GERD -- Continue pantoprazole  daily  Diarrhea -- improving  -- Post recent chemotherapy treatment -- Continue supportive measures for now  Essential hypertension -- With current hypotension, holding all blood pressure lowering medications  Hyperlipidemia -- Resume atorvastatin   CAD nonobstructive -- Continue medical management with aspirin  and atorvastatin  -- holding imdur  and bisoprolol due to  hypotension   Macrocytic anemia -- B12 and folic acid  levels within normal limits   DVT prophylaxis: sq heparin   Code Status: Full  Family Communication: husband updated 10/26, 10/27 Disposition:  continue stepdown ICU for higher level of care    Consultants:   Procedures:   Antimicrobials:  Cefepime  10/26>> Vanc 10/26>> Metronidazole  10/26>>   Subjective: Pt is more alert today, says she wants to go home, poor appetite.    Objective: Vitals:   06/09/24 0500 06/09/24 0600 06/09/24 0700 06/09/24 0803  BP: (!) 110/54 117/65 (!) 103/52   Pulse: (!) 101 (!) 101 99   Resp: 18 16 12    Temp:      TempSrc:      SpO2: 98% 100% 99% 98%  Weight:  81.4 kg    Height:        Intake/Output Summary (Last 24 hours) at 06/09/2024 0833 Last data filed at 06/09/2024 0200 Gross per 24 hour  Intake 1189.75 ml  Output --  Net 1189.75 ml   Filed Weights   06/08/24 0140 06/08/24 0459 06/09/24 0600  Weight: 78 kg 79.6 kg 81.4 kg   Examination:  General exam: Appears calm and comfortable but acutely and chronically ill appearing.  Respiratory system: Clear to auscultation. Respiratory effort normal. Cardiovascular system: normal S1 & S2 heard. No JVD, murmurs, rubs, gallops or clicks. No pedal edema. Gastrointestinal system: Abdomen is nondistended, soft and nontender. No organomegaly or masses felt. Normal bowel sounds heard. Central nervous system: Alert and oriented. No focal neurological deficits. Extremities: Symmetric 5 x 5 power. Skin: No rashes, lesions or ulcers. Psychiatry: Judgement and insight appear normal. Mood & affect appropriate.   Data Reviewed: I have personally reviewed following labs and imaging studies  CBC: Recent Labs  Lab 06/05/24 0834 06/08/24 0205 06/08/24 1131 06/09/24 0412  WBC 1.8* 0.8* 0.3* 0.2*  NEUTROABS 1.0* 0.2* 0.2* 0.0*  HGB 8.6* 8.8* 8.0* 7.3*  HCT 27.2* 28.2* 26.1* 23.4*  MCV 111.9* 113.3* 115.5* 113.6*  PLT 148* 129* 116* 94*    Basic Metabolic Panel: Recent Labs  Lab 06/05/24 0834 06/08/24 0205 06/09/24 0412  NA 138 140 138  K 3.7 4.4 5.5*  CL 96* 96* 97*  CO2 28 24 18*  GLUCOSE 129* 135* 90  BUN 53* 75* 87*   CREATININE 7.29* 8.97* 10.40*  CALCIUM  8.6* 8.2* 8.5*  MG 2.2 2.5*  --   PHOS 7.4* 10.3*  --     CBG: No results for input(s): GLUCAP in the last 168 hours.  Recent Results (from the past 240 hours)  Culture, blood (Routine X 2) w Reflex to ID Panel     Status: None (Preliminary result)   Collection Time: 06/08/24 10:25 AM   Specimen: BLOOD  Result Value Ref Range Status   Specimen Description BLOOD BLOOD RIGHT ARM  Final   Special Requests   Final    AEROBIC BOTTLE ONLY Blood Culture results may not be optimal due to an inadequate volume of blood received in culture bottles   Culture   Final    NO GROWTH < 24 HOURS Performed at [facility], [address], [place], [place] [address]    Report Status PENDING  Incomplete  Culture, blood (Routine X 2) w Reflex to ID Panel     Status: None (Preliminary result)   Collection Time: 06/08/24 10:25 AM   Specimen: BLOOD  Result Value Ref Range Status   Specimen Description BLOOD BLOOD RIGHT HAND  Final   Special  Requests   Final    AEROBIC BOTTLE ONLY Blood Culture results may not be optimal due to an inadequate volume of blood received in culture bottles   Culture   Final    NO GROWTH < 24 HOURS Performed at [facility], [address], [place], [place] [address]    Report Status PENDING  Incomplete  MRSA Next Gen by PCR, Nasal     Status: None   Collection Time: 06/08/24 10:28 PM   Specimen: Nasal Mucosa; Nasal Swab  Result Value Ref Range Status   MRSA by PCR Next Gen NOT DETECTED NOT DETECTED Final    Comment: (NOTE) The GeneXpert MRSA Assay (FDA approved for NASAL specimens only), is one component of a comprehensive MRSA colonization surveillance program. It is not intended to diagnose MRSA infection nor to guide or monitor treatment for MRSA infections. Test performance is not FDA approved in patients less than 73 years old. Performed at [facility], [address], [place], [place] [address]      Radiology  Studies: [facility] Chest [facility] 1 View Result Date: 06/08/2024 CLINICAL DATA:  Sepsis. EXAM: PORTABLE CHEST 1 VIEW COMPARISON:  Radiograph and CT 05/23/2024 FINDINGS: Right chest port remains in place. Extensive pulmonary metastasis again seen. There is overall increase in heterogeneous bilateral lung opacities. Stable heart size and mediastinal contours. Right pleural effusion on CT is not well demonstrated by radiograph. IMPRESSION: Extensive pulmonary metastasis. Overall increase in heterogeneous bilateral lung opacities, progression in disease, superimposed infection or edema. Electronically Signed   By: Andrea Gasman M.D.   On: 06/08/2024 13:53   CT ABDOMEN PELVIS W CONTRAST Result Date: 06/08/2024 EXAM: CT ABDOMEN AND PELVIS WITH CONTRAST 06/08/2024 03:05:08 AM TECHNIQUE: CT of the abdomen and pelvis was performed with the administration of 100 mL of iohexol  (OMNIPAQUE ) 300 MG/ML solution. Multiplanar reformatted images are provided for review. Automated exposure control, iterative reconstruction, and/or weight-based adjustment of the mA/kV was utilized to reduce the radiation dose to as low as reasonably achievable. COMPARISON: PET CT dated 05/22/2024. CLINICAL HISTORY: Abdominal pain, acute, nonlocalized. Pt with c/o abdominal cramping and loose stools (5) since Chemo on Thurs and dialysis on Fri. Husband states pt has had 2 Imodium  prior to arrival. FINDINGS: LOWER CHEST: Innumerable pulmonary metastases, similar to recent PET. 15 mm short axis left axillary nodal metastasis (image 1), incompletely visualized, grossly unchanged. LIVER: 14 mm inferior right hepatic metastasis (image 19), unchanged. GALLBLADDER AND BILE DUCTS: Status post cholecystectomy. No biliary ductal dilatation. SPLEEN: No acute abnormality. PANCREAS: No acute abnormality. ADRENAL GLANDS: No acute abnormality. KIDNEYS, URETERS AND BLADDER: Severe bilateral renal cortical scarring/atrophy. Numerous bilateral renal calculi measuring up  to 7 mm on the right. Small simple bilateral renal cysts measuring up to 15 mm on the left, benign. Per consensus, no follow-up is needed for simple Bosniak type 1 and 2 renal cysts. No hydronephrosis. No perinephric or periureteral stranding. Urinary bladder is unremarkable. GI AND BOWEL: Stomach demonstrates no acute abnormality. There is no bowel obstruction. PERITONEUM AND RETROPERITONEUM: No ascites. No free air. VASCULATURE: Atherosclerotic calcifications of the abdominal aorta and branch vessels, although patent. Aorta is normal in caliber. LYMPH NODES: No lymphadenopathy. REPRODUCTIVE ORGANS: Status post hysterectomy. BONES AND SOFT TISSUES: No acute osseous abnormality. No focal soft tissue abnormality. IMPRESSION: 1. No acute findings. 2. Innumerable pulmonary metastases, left axillary nodal metastasis, and inferior right hepatic metastasis, similar to recent PET. 3. Additional ancillary findings, as above. Electronically signed by: Pinkie Pebbles MD 06/08/2024 03:12 AM  EDT RP Workstation: [id number]    Scheduled Meds:  aspirin  EC  81 mg Oral Daily   atorvastatin   20 mg Oral Daily   Chlorhexidine  Gluconate Cloth  6 each Topical Daily   cinacalcet   90 mg Oral QPM   feeding supplement  237 mL Oral BID BM   fentaNYL   1 patch Transdermal Q72H   heparin   5,000 Units Subcutaneous Q8H   hydrocortisone    Rectal QID   lanthanum   500 mg Oral TID WC   pantoprazole   40 mg Oral BID AC   Continuous Infusions:  ceFEPime  (MAXIPIME ) IV     metronidazole  Stopped (06/09/24 0100)   norepinephrine (LEVOPHED) Adult infusion Stopped (06/09/24 0210)   vancomycin        LOS: 1 day   Critical Care Procedure Note Authorized and Performed by: KYM Louder MD  Total Critical Care time:  55 mins Due to a high probability of clinically significant, life threatening deterioration, the patient required my highest level of preparedness to intervene emergently and I personally spent this critical care time directly  and personally managing the patient.  This critical care time included obtaining a history; examining the patient, pulse oximetry; ordering and review of studies; arranging urgent treatment with development of a management plan; evaluation of patient's response of treatment; frequent reassessment; and discussions with other providers.  This critical care time was performed to assess and manage the high probability of imminent and life threatening deterioration that could result in multi-organ failure.  It was exclusive of separately billable procedures and treating other patients and teaching time.    Afton Louder, MD How to contact the [facility] Attending or Consulting provider 7A - 7P or covering provider during after hours 7P -7A, for this patient?  Check the care team in [facility] and look for a) attending/consulting [facility] provider listed and b) the [facility] team listed Log into [email or web address] to find provider on call.  Locate the [facility] provider you are looking for under [facility] and page to a number that you can be directly reached. If you still have difficulty reaching the provider, please page the [facility] (Director on Call) for the Hospitalists listed on amion for assistance.  06/09/2024, 8:33 AM

## 2024-06-10 ENCOUNTER — Telehealth: Payer: Self-pay

## 2024-06-10 ENCOUNTER — Inpatient Hospital Stay

## 2024-06-10 ENCOUNTER — Encounter: Payer: Self-pay | Admitting: Oncology

## 2024-06-10 DIAGNOSIS — Z66 Do not resuscitate: Secondary | ICD-10-CM

## 2024-06-10 DIAGNOSIS — D709 Neutropenia, unspecified: Secondary | ICD-10-CM

## 2024-06-10 DIAGNOSIS — Z7189 Other specified counseling: Secondary | ICD-10-CM | POA: Diagnosis not present

## 2024-06-10 DIAGNOSIS — R4589 Other symptoms and signs involving emotional state: Secondary | ICD-10-CM

## 2024-06-10 DIAGNOSIS — R5081 Fever presenting with conditions classified elsewhere: Secondary | ICD-10-CM

## 2024-06-10 DIAGNOSIS — Z515 Encounter for palliative care: Secondary | ICD-10-CM | POA: Diagnosis not present

## 2024-06-10 DIAGNOSIS — C50919 Malignant neoplasm of unspecified site of unspecified female breast: Secondary | ICD-10-CM

## 2024-06-10 DIAGNOSIS — A419 Sepsis, unspecified organism: Secondary | ICD-10-CM | POA: Diagnosis not present

## 2024-06-10 DIAGNOSIS — Z789 Other specified health status: Secondary | ICD-10-CM

## 2024-06-10 DIAGNOSIS — R6521 Severe sepsis with septic shock: Secondary | ICD-10-CM | POA: Diagnosis not present

## 2024-06-10 DIAGNOSIS — R197 Diarrhea, unspecified: Secondary | ICD-10-CM | POA: Diagnosis not present

## 2024-06-10 DIAGNOSIS — I959 Hypotension, unspecified: Secondary | ICD-10-CM | POA: Diagnosis not present

## 2024-06-10 LAB — RENAL FUNCTION PANEL
Albumin: 2.8 g/dL — ABNORMAL LOW (ref 3.5–5.0)
Anion gap: 18 — ABNORMAL HIGH (ref 5–15)
BUN: 47 mg/dL — ABNORMAL HIGH (ref 8–23)
CO2: 25 mmol/L (ref 22–32)
Calcium: 8.9 mg/dL (ref 8.9–10.3)
Chloride: 95 mmol/L — ABNORMAL LOW (ref 98–111)
Creatinine, Ser: 6.38 mg/dL — ABNORMAL HIGH (ref 0.44–1.00)
GFR, Estimated: 7 mL/min — ABNORMAL LOW (ref >60)
Glucose, Bld: 134 mg/dL — ABNORMAL HIGH (ref 70–99)
Phosphorus: 7.5 mg/dL — ABNORMAL HIGH (ref 2.5–4.6)
Potassium: 4.1 mmol/L (ref 3.5–5.1)
Sodium: 138 mmol/L (ref 135–145)

## 2024-06-10 LAB — CBC WITH DIFFERENTIAL/PLATELET
Abs Immature Granulocytes: 0 K/uL (ref 0.00–0.07)
Basophils Absolute: 0 K/uL (ref 0.0–0.1)
Basophils Relative: 0 %
Eosinophils Absolute: 0 K/uL (ref 0.0–0.5)
Eosinophils Relative: 8 %
HCT: 21.9 % — ABNORMAL LOW (ref 36.0–46.0)
Hemoglobin: 6.8 g/dL — CL (ref 12.0–15.0)
Immature Granulocytes: 0 %
Lymphocytes Relative: 48 %
Lymphs Abs: 0.2 K/uL — ABNORMAL LOW (ref 0.7–4.0)
MCH: 34.5 pg — ABNORMAL HIGH (ref 26.0–34.0)
MCHC: 31.1 g/dL (ref 30.0–36.0)
MCV: 111.2 fL — ABNORMAL HIGH (ref 80.0–100.0)
Monocytes Absolute: 0 K/uL — ABNORMAL LOW (ref 0.1–1.0)
Monocytes Relative: 8 %
Neutro Abs: 0.1 K/uL — CL (ref 1.7–7.7)
Neutrophils Relative %: 36 %
Platelets: 88 K/uL — ABNORMAL LOW (ref 150–400)
RBC: 1.97 MIL/uL — ABNORMAL LOW (ref 3.87–5.11)
RDW: 17 % — ABNORMAL HIGH (ref 11.5–15.5)
WBC: 0.4 K/uL — CL (ref 4.0–10.5)
nRBC: 0 % (ref 0.0–0.2)

## 2024-06-10 LAB — PREPARE RBC (CROSSMATCH)

## 2024-06-10 LAB — HEMOGLOBIN AND HEMATOCRIT, BLOOD
HCT: 25.3 % — ABNORMAL LOW (ref 36.0–46.0)
Hemoglobin: 8.1 g/dL — ABNORMAL LOW (ref 12.0–15.0)

## 2024-06-10 LAB — HEPATITIS B SURFACE ANTIBODY, QUANTITATIVE: Hep B S AB Quant (Post): 51.1 m[IU]/mL

## 2024-06-10 MED ORDER — SODIUM CHLORIDE 0.9% IV SOLUTION: Freq: Once | INTRAVENOUS | Status: AC

## 2024-06-10 MED ORDER — HYDROMORPHONE HCL 1 MG/ML IJ SOLN
0.5000 mg | Freq: Once | INTRAMUSCULAR | Status: AC
  Administered 2024-06-10: 0.5 mg via INTRAVENOUS
  Filled 2024-06-10: qty 0.5

## 2024-06-10 MED ORDER — PANTOPRAZOLE SODIUM 40 MG PO TBEC
40.0000 mg | DELAYED_RELEASE_TABLET | Freq: Every day | ORAL | Status: DC
  Administered 2024-06-10: 40 mg via ORAL
  Filled 2024-06-10: qty 1

## 2024-06-10 NOTE — Consult Note (Signed)
 Hematology/oncology progress note  I saw Megan [name] at bedside today along with her husband.  Patient reported that she just wants to go home at this time.  We did discuss poor prognosis with metastatic breast carcinoma along with ESRD.  Patient is not sure if she wants to continue treatment at this time or not but would like to discuss this in the outpatient setting with me.  Patient would like to go home at this time, home with hospice.  Recommended patient and her husband to reach out to oncology clinic if they want to see us  in future and continue her care.  Please refer to my previous note for further recommendations.  I appreciate the help by hospitalist team and palliative care for this transition.  Please to let me know if any questions or concerns.  Mickiel Dry, MD Hematology/Oncology [facility] at [facility]

## 2024-06-10 NOTE — Telephone Encounter (Signed)
 Left vm.

## 2024-06-10 NOTE — Telephone Encounter (Signed)
 Copied from CRM [id number]. Topic: General - Other >> Jun 10, 2024 11:51 AM Pinkey ORN wrote: Reason for CRM: Hospice >> Jun 10, 2024 11:53 AM Pinkey ORN wrote: [facility] [phone] ext [phone]  Called on behalf of patient, wanting to know if Duanne Butler DASEN, MD would be the attending physician for patient.

## 2024-06-10 NOTE — TOC Progression Note (Signed)
 Transition of Care [facility]) - Progression Note    Patient Details  Name: Megan [name] MRN: [id number] Date of Birth: 1961-04-02  Transition of Care [facility] CM/SW Contact  Sharlyne Stabs, RN Phone Number: 06/10/2024, 11:01 AM  Clinical Narrative:   Palliative consulted IPCM for referral to [facility] for home hospice. Patient will need a hospital bed, and bedside commode. Refer sent in the hube to [facility].    Expected Discharge Plan: Home/Self Care Barriers to Discharge: Continued Medical Work up    Expected Discharge Plan and Services In-house Referral: Clinical Social Work Discharge Planning Services: NA Post Acute Care Choice: Resumption of Svcs/PTA Provider Living arrangements for the past 2 months: Single Family Home                    Social Drivers of Health (SDOH) Interventions SDOH Screenings   Food Insecurity: No Food Insecurity (06/08/2024)  Housing: Low Risk  (06/08/2024)  Transportation Needs: No Transportation Needs (06/08/2024)  Utilities: Not At Risk (06/08/2024)  Alcohol Screen: Low Risk  (09/20/2023)  Depression (PHQ2-9): Low Risk  (06/05/2024)  Financial Resource Strain: Low Risk  (09/20/2023)  Physical Activity: Inactive (09/20/2023)  Social Connections: Unknown (06/08/2024)  Stress: No Stress Concern Present (09/20/2023)  Tobacco Use: Low Risk  (06/08/2024)    Readmission Risk Interventions    06/09/2024    4:05 PM 06/08/2024    8:34 AM 05/24/2024    2:23 PM  Readmission Risk Prevention Plan  Transportation Screening Complete Complete Complete  Medication Review Oceanographer) Complete Complete Complete  PCP or Specialist appointment within 3-5 days of discharge  Complete Not Complete  HRI or Home Care Consult Complete Complete Complete  SW Recovery Care/Counseling Consult Complete Complete Complete  Palliative Care Screening Not Applicable Not Applicable Not Applicable  Skilled Nursing Facility Not Applicable Not Applicable Not Applicable

## 2024-06-10 NOTE — Consult Note (Signed)
 Palliative Care Consult Note                                  Date: 06/10/2024   Patient Name: Megan [name]  DOB: 11-12-1960  MRN: [id number]  Age / Sex: 63 y.o., female  PCP: Duanne Butler DASEN, MD Referring Physician: Vicci Afton CROME, MD  Reason for Consultation: Establishing goals of care  Past Medical History:  Diagnosis Date   (HFpEF) heart failure with preserved ejection fraction (HCC)    a. 01/2019 Echo: EF 55-60%, mild conc LVH. DD.  Torn MV chordae.   Anemia    Atypical chest pain    a. 08/2018 MV: EF 59%, no ischemia; b. 02/2019 Cath: nonobs dzs.   Blood transfusion without reported diagnosis    Breast cancer (HCC) 10/12/2020   Cataract    ESRD (end stage renal disease) on dialysis [facility])    a. HD T, T, S   Essential hypertension, benign    GERD (gastroesophageal reflux disease)    Headache    Hemorrhoids    Mixed hyperlipidemia    Morbid obesity (HCC)    Non-obstructive CAD (coronary artery disease)    a. 02/2019 CathL LM nl, LAD 51m, LCX nl, RCA 25p, EF 55-65%.   PONV (postoperative nausea and vomiting)    Renal insufficiency    S/P colonoscopy 08/2009   Dr. Rollin: sessile polyp (benign lymphoid), large hemorrhoids, repeat 5-10 years   Temporal arteritis (HCC)    Type 2 diabetes mellitus (HCC)    Wears glasses     Subjective:   This NP Camellia Kays reviewed medical records, received report from team, assessed the patient and then meet at the patient's bedside to discuss diagnosis, prognosis, GOC, EOL wishes disposition and options.  Before meeting with the patient/family, I spent time reviewing the chart notes including hospice note from the day, nephrology note from yesterday, nursing note from yesterday, oncology note from yesterday, hospitalist note from today, nursing note from today. I also reviewed vital signs, nursing flowsheets, medication administrations record, labs, and imaging. Labs reviewed include  CBC which shows progressive but stable leukopenia with white count 0.4 today in the setting of cancer ongoing chemotherapy and sepsis.  Also with anemia with hemoglobin 6.8 today and platelets of 88 indicative of pancytopenia with ongoing chemotherapy and cancer.  Renal function panel shows improvement in creatinine from 10.40 yesterday to 6.38 today consistent with hemodialysis, next planned HD tomorrow.  I met with the patient at the bedside, her husband was also present.  The patient's husband and I excused ourselves to the conference room for goals of care discussion as the patient is quite sleepy after receiving pain medicine.   We meet to discuss diagnosis prognosis, GOC, EOL wishes, disposition and options. Concept of Palliative Care was introduced as specialized medical care for people and their families living with serious illness.  If focuses on providing relief from the symptoms and stress of a serious illness.  The goal is to improve quality of life for both the patient and the family. Values and goals of care important to patient and family were attempted to be elicited.  Created space and opportunity for patient  and family to explore thoughts and feelings regarding current medical situation   Natural trajectory and current clinical status were discussed. Questions and concerns addressed. Patient  encouraged to call with questions or concerns.    Patient/Family Understanding  of Illness: The patient's husband has a very good understanding of her chronic and acute illnesses.  We discussed these in detail including renal failure on hemodialysis since 2017, recent chemotherapy, 24/7 oxygen  needs.  He states that she has had cancer 4 times, and this is the fourth which is now spread to her lymph nodes, bilateral lungs, and liver.  She has also had a bilateral mastectomy.  He notes that at this point her time is quite short.  Life Review: The patient and her husband have been married for 18  years, both previously married.  They currently live in [place], [place] .  Baseline Status: Prior to restarting chemotherapy she was able to get up, drive, get mail.  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  He understands that currently she requires significant assistance for most activities of daily life.  Today's Discussion: In addition to discussions described above we had extensive discussion of various topics.  We reviewed conversations have been had with oncology and the hospitalist team.  The patient's husband understands that her time is short.  He states that she has told him I want to go home and he understands this to me and she wants to go back to her house.  We talked about her battle of 3 previous cancer recurrences and the fact that she is unlikely to survive this battle.  He understands this.  He shares that his goal is for her to be home and comfortable at the end of life.  Because of the stated goals of comfort at home, the acceptance that she will not survive long, we discussed hospice care. I described hospice as a service for patients who have a life expectancy of 6 months or less. The goal of hospice is the preservation of dignity and quality at the end phases of life. Under hospice care, the focus changes from curative to symptom relief. I explained the three setting where hospice services can be provided including the home, at a living facility (such as LTC SNF, Assisted Living, etc), and a hospice facility. I explained that acceptance to hospice in any specific location is the final decision of the hospice medical director and bed availability, if applicable. They verbalized understanding.  The patient's husband is in agreement to discharge home with hospice after hemodialysis tomorrow.  We also discussed CODE STATUS and he is agreeable to DNR/DNI given her prognosis and  desire to not suffer.  Goals: At the end of her conversation all are in agreement that goal is to continue current scope of care with goal of getting to and through hemodialysis tomorrow.  At that point they would like to discharge home with hospice support after hemodialysis tomorrow.  He expressed that it is imperative that equipment be delivered prior to her getting home to help him care for her.  We also agreed to DNR/DNI to keep care within the goal of not suffering, especially with the given prognosis.  Review of Systems  Unable to perform ROS   Objective:   Primary Diagnoses: Present on Admission:  Diarrhea with dehydration  Severe sepsis with septic shock (HCC)   Vital Signs:  BP 114/61   Pulse 95   Temp (!) 97.4 F (36.3 C) (Other (Comment))   Resp 16   Ht 5' 2 (1.575 m)   Wt 78.7 kg   SpO2 100%   BMI 31.73 kg/m   Physical Exam Vitals  and nursing note reviewed.  Constitutional:      General: She is sleeping. She is not in acute distress. HENT:     Head: Normocephalic and atraumatic.  Cardiovascular:     Rate and Rhythm: Normal rate.  Pulmonary:     Effort: Pulmonary effort is normal. No respiratory distress.  Skin:    General: Skin is warm and dry.     Palliative Assessment/Data: 20%   Advanced Care Planning:   Existing Vynca/ACP Documentation: None  Primary Decision Maker: NEXT OF KIN  Pertinent diagnosis: Stage IV cancer, sepsis, septic shock, neutropenia  The patient and/or family consented to a voluntary Advance Care Planning Conversation in person. Individuals present for the conversation: The patient's husband Megan [name]; Camellia Kays, NP  Summary of the conversation: We discussed her chronic illness, acute presentation, recent progression of cancer.  We discussed her short timeframe remaining.  We discussed CODE STATUS.  We discussed options moving forward from continued full and aggressive care through comfort care/hospice, and multiple points in  between.  Outcome of the conversations and/or documents completed: DNR/DNI, continue current scope of treatment to get to/through hemodialysis tomorrow.  After hemodialysis discharge home with hospice support and equipment delivered.  I spent 20 minutes providing separately identifiable ACP services with the patient and/or surrogate decision maker in a voluntary, in-person conversation discussing the patient's wishes and goals as detailed in the above note.  Assessment & Plan:   HPI/Patient Profile: 63 y.o. female  with past medical history of hypertension, hyperlipidemia, chronic diastolic heart failure, anemia of chronic disease, ESRD, GERD, COPD with chronic respiratory failure and hypoxia on 2 LPM of oxygen  at baseline, class I obesity, history of nonobstructive coronary artery disease and metastatic breast cancer who presents to the emergency department for evaluation of diarrhea.  She was admitted on 06/08/2024 with severe neutropenia status post recent chemotherapy, neutropenic fever, severe sepsis with septic shock, rectal pain, prolapsed hemorrhoids, failure to thrive, generalized weakness, ESRD on HD, chronic hypoxic respiratory failure, diarrhea, and others.   Palliative medicine was consulted for GOC conversations.  SUMMARY OF RECOMMENDATIONS   Changed to DNR/DNI (DNR-limited) Continue current scope of care Plan for HD tomorrow [facility] consult for referral to hospice with [facility] Discharge home with hospice after hemodialysis Palliative medicine will continue to follow  Symptom Management:  Per primary team Palliative medicine is available to assist as needed  Code Status: DNR - Limited (DNR/DNI)  Prognosis:  < 2 weeks  Discharge Planning:  Home with Hospice   Discussed with: Patient's family, medical team, nursing team, [facility] team    Thank you for allowing us  to participate in the care of TARALYN FERRAIOLO PMT will continue to support holistically.  Billing based  on MDM: High  Problems Addressed: One acute or chronic illness or injury that poses a threat to life or bodily function  Amount and/or Complexity of Data: Category 1:Review of prior external note(s) from each unique source, Review of the result(s) of each unique test, and Assessment requiring an independent historian(s) and Category 3:Discussion of management or test interpretation with external physician/other qualified health care professional/appropriate source (not separately reported)  Risks: Decision not to resuscitate or to de-escalate care because of poor prognosis  Detailed review of medical records (labs, imaging, vital signs), medically appropriate exam, discussed with treatment team, counseling and education to patient, family, & staff, documenting clinical information, medication management, coordination of care  Signed by: Camellia Kays, NP Palliative Medicine Team  Team Phone #  [phone] (Nights/Weekends)  06/10/2024, 10:59 AM

## 2024-06-10 NOTE — Plan of Care (Signed)
  Problem: Education: Goal: Knowledge of General Education information will improve Description: Including pain rating scale, medication(s)/side effects and non-pharmacologic comfort measures Outcome: Progressing   Problem: Health Behavior/Discharge Planning: Goal: Ability to manage health-related needs will improve Outcome: Progressing   Problem: Clinical Measurements: Goal: Respiratory complications will improve Outcome: Progressing   Problem: Respiratory: Goal: Ability to maintain adequate ventilation will improve Outcome: Progressing

## 2024-06-10 NOTE — Progress Notes (Signed)
 PROGRESS NOTE   Megan [name]  FMW:[id number] DOB: 08-18-1960 DOA: 06/08/2024 PCP: Duanne Butler DASEN, MD   Chief Complaint  Patient presents with   Abdominal Pain   Diarrhea   Level of care: ICU  Brief Admission History:  63 y.o. female with medical history significant of hypertension, hyperlipidemia,  chronic diastolic heart failure, anemia of chronic disease, ESRD, GERD, COPD with chronic respiratory failure and hypoxia on 2 LPM of oxygen  at baseline, class I obesity, history of nonobstructive coronary artery disease and metastatic breast cancer who presents to the emergency department for evaluation of diarrhea.  She had her third dose of chemotherapy 2 days ago, diarrhea started yesterday, this was associated with rectal pain due to external hemorrhoids which was not responsive to home topical medication.  Imodium  used 2-3 times at home is yet to improve diarrhea.  She also complained of diffuse abdominal pain.  She denied any fever, chills, nausea, vomiting.   ED Course: In the emergency department, temperature was 97.24F, pulse 106 bpm, O2 sats 91-98% on 2 LPM of oxygen .  Workup in the ED showed WBC 0.8, hemoglobin 8.8, hematocrit 22.2, MCV 113.3, platelets 129.  BMP showed sodium 140, potassium 4.4, chloride 96, bicarb 24, blood glucose 135, BUN 75, creatinine 0.97, albumin 3.0, lipase 24.  CT abdomen and pelvis with contrast showed no acute findings.  Innumerable pulmonary metastasis, left axillary nodal metastasis and inferior right hepatic metastasis, similar to recent PET. IV Dilaudid  was given due to pain, Zofran  was given and IV NS 500 mL was provided.   Assessment and Plan:  Severe Neutropenia s/p recent chemotherapy Neutropenic Fever  Severe sepsis with septic shock  -- ordered blood cultures x 2-- NGTD -- broad spectrum antibiotics started after blood cultures obtained -- pt has received a dose of pegfilgrastim  on 06/06/24 thru the oncology center  -- hopefully her  ANC will start to improve after 48 hours and continue to improve over next 9-12 days -- fortunately BPs have started to improve with IV fluid bolus given, follow closely  -- portable CXR ordered STAT to check for pneumonia -- indeterminate given extensive metastatic disease -- acetaminophen  ordered for fever  -- pt started on IV norepinephrine infusion overnight due to hypotension, will try to wean off IV norepinephrine today  Neutropenia -- discussed with her oncologist Dr. Davonna consulted -- Pt received GCSF injection at cancer center on 10/24   Rectal Pain  Prolapsed hemorrhoids -- has been severe from prolapsed hemorrhoids -- pain is better controlled  -- topical treatments ordered for hemorrhoids -- IV pain meds ordered PRN and she currently is wearing a fentanyl  patch   Adult Failure to thrive -- pt does not seem to be tolerating chemo well -- remains full code  -- recent palliative consult and they were not willing to talk goals of care, but discussed with husband again today and he is agreeable to a palliative consult today  Generalized Weakness -- supportive measures for now -- very low likelihood for meaningful recovery given advanced malignancy  ESRD on HD (MWF) -- consult to nephrology for inpatient HD on 10/27  -- pt tolerated HD on 10/27 without pressors -- if remains full scope care and on pressors may need CRRT if HD is to continue -- palliative consulted to help clarify goals of care   Chronic hypoxic respiratory failure -- continue supplemental oxygen  support  GERD -- Continue pantoprazole  daily  Diarrhea -- improving  -- Post recent chemotherapy treatment -- Continue supportive  measures for now -- if persists, start prednisone    Essential hypertension -- With current hypotension, holding all blood pressure lowering medications -- currently on IV pressor support   Hyperlipidemia -- Resumed atorvastatin   CAD nonobstructive -- Continue medical  management with aspirin  and atorvastatin  -- holding imdur  and bisoprolol due to hypotension   Macrocytic anemia -- B12 and folic acid  levels within normal limits   DVT prophylaxis: sq heparin   Code Status: Full  Family Communication: husband updated 10/26, 10/27, 10/28 Disposition: continue ICU for higher level of care -- husband wants to take home tomorrow after HD, given she is on pressors for septic shock, tried to communicate with him that she would need to go home on full hospice care for this to happen   Consultants:   Procedures:   Antimicrobials:  Cefepime  10/26>> Vanc 10/26>> Metronidazole  10/26>>   Subjective: Pt somnolent, received pain meds this morning.    Objective: Vitals:   06/10/24 0445 06/10/24 0500 06/10/24 0709 06/10/24 0800  BP: (!) 110/44 (!) 113/42  (!) 101/51  Pulse: (!) 103 (!) 104  97  Resp: 15 20  14   Temp:   98.4 F (36.9 C)   TempSrc:   Oral   SpO2: 95% 96%  97%  Weight:      Height:        Intake/Output Summary (Last 24 hours) at 06/10/2024 0950 Last data filed at 06/10/2024 0943 Gross per 24 hour  Intake 522.81 ml  Output 1500 ml  Net -977.19 ml   Filed Weights   06/09/24 0600 06/09/24 1215 06/09/24 1615  Weight: 81.4 kg 81.4 kg 78.7 kg   Examination:  General exam: Appears somnolent, calm and comfortable but acutely and chronically ill appearing.  Respiratory system: Clear to auscultation. Respiratory effort normal. Cardiovascular system: normal S1 & S2 heard. No JVD, murmurs, rubs, gallops or clicks. No pedal edema. Gastrointestinal system: Abdomen is nondistended, soft and nontender. No organomegaly or masses felt. Normal bowel sounds heard. Central nervous system: somnolent. No focal neurological deficits. Extremities: Symmetric 5 x 5 power. Skin: No rashes, lesions or ulcers. Psychiatry: Judgement and insight UTD. Mood & affect flat.   Data Reviewed: I have personally reviewed following labs and imaging  studies  CBC: Recent Labs  Lab 06/05/24 0834 06/08/24 0205 06/08/24 1131 06/09/24 0412 06/10/24 0407  WBC 1.8* 0.8* 0.3* 0.2* 0.4*  NEUTROABS 1.0* 0.2* 0.2* 0.0* 0.1*  HGB 8.6* 8.8* 8.0* 7.3* 6.8*  HCT 27.2* 28.2* 26.1* 23.4* 21.9*  MCV 111.9* 113.3* 115.5* 113.6* 111.2*  PLT 148* 129* 116* 94* 88*    Basic Metabolic Panel: Recent Labs  Lab 06/05/24 0834 06/08/24 0205 06/09/24 0412 06/10/24 0407  NA 138 140 138 138  K 3.7 4.4 5.5* 4.1  CL 96* 96* 97* 95*  CO2 28 24 18* 25  GLUCOSE 129* 135* 90 134*  BUN 53* 75* 87* 47*  CREATININE 7.29* 8.97* 10.40* 6.38*  CALCIUM  8.6* 8.2* 8.5* 8.9  MG 2.2 2.5*  --   --   PHOS 7.4* 10.3*  --  7.5*    CBG: No results for input(s): GLUCAP in the last 168 hours.  Recent Results (from the past 240 hours)  Culture, blood (Routine X 2) w Reflex to ID Panel     Status: None (Preliminary result)   Collection Time: 06/08/24 10:25 AM   Specimen: BLOOD  Result Value Ref Range Status   Specimen Description BLOOD BLOOD RIGHT ARM  Final   Special Requests   Final  AEROBIC BOTTLE ONLY Blood Culture results may not be optimal due to an inadequate volume of blood received in culture bottles   Culture   Final    NO GROWTH 2 DAYS Performed at [facility], [address] [place], [place] [address]    Report Status PENDING  Incomplete  Culture, blood (Routine X 2) w Reflex to ID Panel     Status: None (Preliminary result)   Collection Time: 06/08/24 10:25 AM   Specimen: BLOOD  Result Value Ref Range Status   Specimen Description BLOOD BLOOD RIGHT HAND  Final   Special Requests   Final    AEROBIC BOTTLE ONLY Blood Culture results may not be optimal due to an inadequate volume of blood received in culture bottles   Culture   Final    NO GROWTH 2 DAYS Performed at [facility] [address], [place], [place] [address]    Report Status PENDING  Incomplete  MRSA Next Gen by PCR, Nasal     Status: None   Collection Time: 06/08/24 10:28  PM   Specimen: Nasal Mucosa; Nasal Swab  Result Value Ref Range Status   MRSA by PCR Next Gen NOT DETECTED NOT DETECTED Final    Comment: (NOTE) The GeneXpert MRSA Assay (FDA approved for NASAL specimens only), is one component of a comprehensive MRSA colonization surveillance program. It is not intended to diagnose MRSA infection nor to guide or monitor treatment for MRSA infections. Test performance is not FDA approved in patients less than 20 years old. Performed at [facility], [address] [place], [place] [address]      Radiology Studies: [facility] Chest [facility] 1 View Result Date: 06/08/2024 CLINICAL DATA:  Sepsis. EXAM: PORTABLE CHEST 1 VIEW COMPARISON:  Radiograph and CT 05/23/2024 FINDINGS: Right chest port remains in place. Extensive pulmonary metastasis again seen. There is overall increase in heterogeneous bilateral lung opacities. Stable heart size and mediastinal contours. Right pleural effusion on CT is not well demonstrated by radiograph. IMPRESSION: Extensive pulmonary metastasis. Overall increase in heterogeneous bilateral lung opacities, progression in disease, superimposed infection or edema. Electronically Signed   By: Andrea Gasman M.D.   On: 06/08/2024 13:53    Scheduled Meds:  sodium chloride    Intravenous Once   aspirin  EC  81 mg Oral Daily   Chlorhexidine  Gluconate Cloth  6 each Topical Daily   Chlorhexidine  Gluconate Cloth  6 each Topical Q0600   cinacalcet   90 mg Oral QPM   feeding supplement  237 mL Oral BID BM   fentaNYL   1 patch Transdermal Q72H   fluconazole   100 mg Oral QODAY   heparin   5,000 Units Subcutaneous Q8H   hydrocortisone    Rectal QID   lanthanum   500 mg Oral TID WC   nystatin  5 mL Oral QID   OLANZapine   5 mg Oral QHS   pantoprazole   40 mg Oral q1800   Continuous Infusions:  anticoagulant sodium citrate     norepinephrine (LEVOPHED) Adult infusion Stopped (06/10/24 0821)   piperacillin-tazobactam (ZOSYN)  IV Stopped (06/10/24 0639)      LOS: 2 days   Critical Care Procedure Note Authorized and Performed by: KYM Louder MD  Total Critical Care time:  58 mins Due to a high probability of clinically significant, life threatening deterioration, the patient required my highest level of preparedness to intervene emergently and I personally spent this critical care time directly and personally managing the patient.  This critical care time included obtaining a history; examining  the patient, pulse oximetry; ordering and review of studies; arranging urgent treatment with development of a management plan; evaluation of patient's response of treatment; frequent reassessment; and discussions with other providers.  This critical care time was performed to assess and manage the high probability of imminent and life threatening deterioration that could result in multi-organ failure.  It was exclusive of separately billable procedures and treating other patients and teaching time.    Afton Louder, MD How to contact the [facility] Attending or Consulting provider 7A - 7P or covering provider during after hours 7P -7A, for this patient?  Check the care team in [facility] and look for a) attending/consulting [facility] provider listed and b) the [facility] team listed Log into [email or web address] to find provider on call.  Locate the [facility] provider you are looking for under [facility] and page to a number that you can be directly reached. If you still have difficulty reaching the provider, please page the [facility] (Director on Call) for the Hospitalists listed on amion for assistance.  06/10/2024, 9:50 AM

## 2024-06-10 NOTE — Progress Notes (Signed)
 Patient ID: Megan [name], female   DOB: 1960/10/23, 63 y.o.   MRN: [id number] S: Husband at bedside and reports that she wants to go home tomorrow.  Palliative care has seen her and is arranging for her to go home with hospice.  They would like to have HD here tomorrow morning before she leaves but doubtful that she can continue with outpatient HD. O:BP 114/61   Pulse 95   Temp (!) 97.4 F (36.3 C) (Other (Comment))   Resp 16   Ht 5' 2 (1.575 m)   Wt 78.7 kg   SpO2 100%   BMI 31.73 kg/m   Intake/Output Summary (Last 24 hours) at 06/10/2024 1125 Last data filed at 06/10/2024 0943 Gross per 24 hour  Intake 522.81 ml  Output 1500 ml  Net -977.19 ml   Intake/Output: I/O last 3 completed shifts: In: 1634.6 [P.O.:240; I.V.:71.7; IV Piggyback:1322.9] Out: 1500 [Other:1500]  Intake/Output this shift:  Total I/O In: 100.1 [I.V.:50.1; IV Piggyback:50] Out: -  Weight change: 0 kg Gen: ill-appearing, fatigued, no distress CVS: RRR Resp:CTA Abd: +BS, soft, NT/ND Ext: no edema, LAVF +T/B  Recent Labs  Lab 06/05/24 0834 06/08/24 0205 06/09/24 0412 06/10/24 0407  NA 138 140 138 138  K 3.7 4.4 5.5* 4.1  CL 96* 96* 97* 95*  CO2 28 24 18* 25  GLUCOSE 129* 135* 90 134*  BUN 53* 75* 87* 47*  CREATININE 7.29* 8.97* 10.40* 6.38*  ALBUMIN 3.0* 3.0* 2.6* 2.8*  CALCIUM  8.6* 8.2* 8.5* 8.9  PHOS 7.4* 10.3*  --  7.5*  AST 24 21 15   --   ALT 21 19 14   --    Liver Function Tests: Recent Labs  Lab 06/05/24 0834 06/08/24 0205 06/09/24 0412 06/10/24 0407  AST 24 21 15   --   ALT 21 19 14   --   ALKPHOS 90 83 63  --   BILITOT 0.3 0.3 0.3  --   PROT 5.7* 5.6* 4.9*  --   ALBUMIN 3.0* 3.0* 2.6* 2.8*   Recent Labs  Lab 06/08/24 0205  LIPASE 24   No results for input(s): AMMONIA in the last 168 hours. CBC: Recent Labs  Lab 06/05/24 0834 06/08/24 0205 06/08/24 1131 06/09/24 0412 06/10/24 0407  WBC 1.8* 0.8* 0.3* 0.2* 0.4*  NEUTROABS 1.0* 0.2* 0.2* 0.0* 0.1*  HGB 8.6*  8.8* 8.0* 7.3* 6.8*  HCT 27.2* 28.2* 26.1* 23.4* 21.9*  MCV 111.9* 113.3* 115.5* 113.6* 111.2*  PLT 148* 129* 116* 94* 88*   Cardiac Enzymes: No results for input(s): CKTOTAL, CKMB, CKMBINDEX, TROPONINI in the last 168 hours. CBG: No results for input(s): GLUCAP in the last 168 hours.  Iron  Studies: No results for input(s): IRON , TIBC, TRANSFERRIN, FERRITIN in the last 72 hours. Studies/Results: DG Chest Port 1 View Result Date: 06/08/2024 CLINICAL DATA:  Sepsis. EXAM: PORTABLE CHEST 1 VIEW COMPARISON:  Radiograph and CT 05/23/2024 FINDINGS: Right chest port remains in place. Extensive pulmonary metastasis again seen. There is overall increase in heterogeneous bilateral lung opacities. Stable heart size and mediastinal contours. Right pleural effusion on CT is not well demonstrated by radiograph. IMPRESSION: Extensive pulmonary metastasis. Overall increase in heterogeneous bilateral lung opacities, progression in disease, superimposed infection or edema. Electronically Signed   By: Andrea Gasman M.D.   On: 06/08/2024 13:53    aspirin  EC  81 mg Oral Daily   Chlorhexidine  Gluconate Cloth  6 each Topical Daily   Chlorhexidine  Gluconate Cloth  6 each Topical Q0600   cinacalcet   90 mg Oral QPM   feeding supplement  237 mL Oral BID BM   fentaNYL   1 patch Transdermal Q72H   fluconazole   100 mg Oral QODAY   heparin   5,000 Units Subcutaneous Q8H   hydrocortisone    Rectal QID   lanthanum   500 mg Oral TID WC   nystatin  5 mL Oral QID   OLANZapine   5 mg Oral QHS   pantoprazole   40 mg Oral q1800    BMET    Component Value Date/Time   NA 138 06/10/2024 0407   K 4.1 06/10/2024 0407   CL 95 (L) 06/10/2024 0407   CO2 25 06/10/2024 0407   GLUCOSE 134 (H) 06/10/2024 0407   BUN 47 (H) 06/10/2024 0407   CREATININE 6.38 (H) 06/10/2024 0407   CREATININE 9.08 (H) 01/10/2024 1113   CALCIUM  8.9 06/10/2024 0407   GFRNONAA 7 (L) 06/10/2024 0407   GFRNONAA 5 (L) 11/25/2020 0940    GFRAA 5 (L) 11/25/2020 0940   CBC    Component Value Date/Time   WBC 0.4 (LL) 06/10/2024 0407   RBC 1.97 (L) 06/10/2024 0407   HGB 6.8 (LL) 06/10/2024 0407   HCT 21.9 (L) 06/10/2024 0407   PLT 88 (L) 06/10/2024 0407   MCV 111.2 (H) 06/10/2024 0407   MCH 34.5 (H) 06/10/2024 0407   MCHC 31.1 06/10/2024 0407   RDW 17.0 (H) 06/10/2024 0407   LYMPHSABS 0.2 (L) 06/10/2024 0407   MONOABS 0.0 (L) 06/10/2024 0407   EOSABS 0.0 06/10/2024 0407   BASOSABS 0.0 06/10/2024 0407    Assessment/Recommendations:   ESRD -outpatient HD orders: [facility] MWF. F180. EDW 79.7kg.  aVF 15-gauge, flow rates: 400/800.  2K/2.5 calcium .  Heparin : 2100 units bolus.  Meds: Aranesp  160 mcg once a week (last dose 10/24), Venofer  50 mg once a week, calcitriol  1.5 mcg 3 times weekly -HD planned for tomorrow morning.  She will be going home with hospice tomorrow after HD.  I doubt that she will be able to continue with outpatient HD given need for pressors and will likely expire at home.     Septic shock -per primary -on broad spectrum abx -started on levophed   Neutropenia Neutropenic fevers -per primary service   Metastatic breast cancer Extensive pulmonary metastasis -appreciate hemonc and palliative care input -plan is to go home tomorrow with hospice services and husband has already made her DNR   Rectal pain Prolapsed hemorrhoids -pain mgmt per primary service   Diarrhea -supportive care, consider checking for Cdiff   Hyperkalemia -K 5.5, improved with HD yesterday   Acidosis -secondary to ESRD and diarrhea, will attempt to manage with HD   FTT -secondary to malignancy and chemo -per primary   Volume/ history of hypertension  -Hold any BP meds. UF as hemodynamics tolerates, will likely not be able to pull much today given soft BPs   Anemia of Chronic Kidney Disease Pancytopenia Hemoglobin 6.8, holding ESA in the context of advancing malignancy and on chemo  -receiving blood transfusion this  morning   Secondary Hyperparathyroidism/Hyperphosphatemia - resume home binders, takes lanthanum  at home but utilizing a lower dose -renal diet  -on sensipar   Disposition -poor overall prognosis and [name] has decided to go home with hospice but does want a session of dialysis tomorrow morning.  Appreciate Palliative care's assistance.    Fairy RONAL Sellar, MD [facility]

## 2024-06-11 ENCOUNTER — Telehealth: Payer: Self-pay | Admitting: Nurse Practitioner

## 2024-06-11 ENCOUNTER — Other Ambulatory Visit: Payer: Self-pay | Admitting: Family Medicine

## 2024-06-11 DIAGNOSIS — R6521 Severe sepsis with septic shock: Secondary | ICD-10-CM | POA: Diagnosis not present

## 2024-06-11 DIAGNOSIS — N186 End stage renal disease: Secondary | ICD-10-CM | POA: Diagnosis not present

## 2024-06-11 DIAGNOSIS — Z66 Do not resuscitate: Secondary | ICD-10-CM | POA: Diagnosis not present

## 2024-06-11 DIAGNOSIS — A419 Sepsis, unspecified organism: Secondary | ICD-10-CM | POA: Diagnosis not present

## 2024-06-11 DIAGNOSIS — R197 Diarrhea, unspecified: Secondary | ICD-10-CM | POA: Diagnosis not present

## 2024-06-11 DIAGNOSIS — Z7189 Other specified counseling: Secondary | ICD-10-CM | POA: Diagnosis not present

## 2024-06-11 DIAGNOSIS — Z515 Encounter for palliative care: Secondary | ICD-10-CM | POA: Diagnosis not present

## 2024-06-11 LAB — CBC WITH DIFFERENTIAL/PLATELET
Abs Immature Granulocytes: 0.02 K/uL (ref 0.00–0.07)
Basophils Absolute: 0 K/uL (ref 0.0–0.1)
Basophils Relative: 0 %
Eosinophils Absolute: 0 K/uL (ref 0.0–0.5)
Eosinophils Relative: 6 %
HCT: 24.9 % — ABNORMAL LOW (ref 36.0–46.0)
Hemoglobin: 7.9 g/dL — ABNORMAL LOW (ref 12.0–15.0)
Immature Granulocytes: 6 %
Lymphocytes Relative: 57 %
Lymphs Abs: 0.2 K/uL — ABNORMAL LOW (ref 0.7–4.0)
MCH: 33.6 pg (ref 26.0–34.0)
MCHC: 31.7 g/dL (ref 30.0–36.0)
MCV: 106 fL — ABNORMAL HIGH (ref 80.0–100.0)
Monocytes Absolute: 0.1 K/uL (ref 0.1–1.0)
Monocytes Relative: 14 %
Neutro Abs: 0.1 K/uL — CL (ref 1.7–7.7)
Neutrophils Relative %: 17 %
Platelets: 60 K/uL — ABNORMAL LOW (ref 150–400)
RBC: 2.35 MIL/uL — ABNORMAL LOW (ref 3.87–5.11)
RDW: 22.5 % — ABNORMAL HIGH (ref 11.5–15.5)
WBC: 0.4 K/uL — CL (ref 4.0–10.5)
nRBC: 0 % (ref 0.0–0.2)

## 2024-06-11 LAB — TYPE AND SCREEN
ABO/RH(D): B POS
Antibody Screen: NEGATIVE
Unit division: 0

## 2024-06-11 LAB — RENAL FUNCTION PANEL
Albumin: 2.6 g/dL — ABNORMAL LOW (ref 3.5–5.0)
Anion gap: 19 — ABNORMAL HIGH (ref 5–15)
BUN: 67 mg/dL — ABNORMAL HIGH (ref 8–23)
CO2: 24 mmol/L (ref 22–32)
Calcium: 8.8 mg/dL — ABNORMAL LOW (ref 8.9–10.3)
Chloride: 97 mmol/L — ABNORMAL LOW (ref 98–111)
Creatinine, Ser: 8.16 mg/dL — ABNORMAL HIGH (ref 0.44–1.00)
GFR, Estimated: 5 mL/min — ABNORMAL LOW (ref >60)
Glucose, Bld: 93 mg/dL (ref 70–99)
Phosphorus: 8 mg/dL — ABNORMAL HIGH (ref 2.5–4.6)
Potassium: 3.8 mmol/L (ref 3.5–5.1)
Sodium: 140 mmol/L (ref 135–145)

## 2024-06-11 LAB — BPAM RBC
Blood Product Expiration Date: 202511082359
ISSUE DATE / TIME: 202510281006
Unit Type and Rh: 1700

## 2024-06-11 MED ORDER — HEPARIN SOD (PORK) LOCK FLUSH 100 UNIT/ML IV SOLN
500.0000 [IU] | Freq: Once | INTRAVENOUS | Status: AC
  Administered 2024-06-11: 500 [IU] via INTRAVENOUS
  Filled 2024-06-11 (×2): qty 5

## 2024-06-11 MED ORDER — HYDROCORTISONE (PERIANAL) 2.5 % EX CREA: TOPICAL_CREAM | Freq: Four times a day (QID) | CUTANEOUS | 0 refills | Status: DC

## 2024-06-11 NOTE — Discharge Summary (Signed)
 Physician Discharge Summary   Patient: Megan [name] MRN: [id number] DOB: [birth year]  Admit date:     06/08/2024  Discharge date: 06/11/24  Discharge Physician: Alm Riyana Biel   PCP: Duanne Butler DASEN, MD   Recommendations at discharge:  Discharge home with [facility] to follow at home    Hospital Course: 63 y.o. female with medical history significant of hypertension, hyperlipidemia,  chronic diastolic heart failure, anemia of chronic disease, ESRD, GERD, COPD with chronic respiratory failure and hypoxia on 2 LPM of oxygen  at baseline, class I obesity, history of nonobstructive coronary artery disease and metastatic breast cancer who presents to the emergency department for evaluation of diarrhea.  She had her second dose of chemotherapy 2 days ago, diarrhea started day before admission.  This was associated with rectal pain due to external hemorrhoids which was not responsive to home topical medication.  Imodium  used 2-3 times at home is yet to improve diarrhea.  She also complained of diffuse abdominal pain.  She denied any fever, chills, nausea, vomiting.   ED Course: In the emergency department, temperature was 97.59F, pulse 106 bpm, O2 sats 91-98% on 2 LPM of oxygen .  Workup in the ED showed WBC 0.8, hemoglobin 8.8, hematocrit 22.2, MCV 113.3, platelets 129.  BMP showed sodium 140, potassium 4.4, chloride 96, bicarb 24, blood glucose 135, BUN 75, creatinine 0.97, albumin 3.0, lipase 24.  CT abdomen and pelvis with contrast showed no acute findings.  Innumerable pulmonary metastasis, left axillary nodal metastasis and inferior right hepatic metastasis, similar to recent PET. IV Dilaudid  was given due to pain, Zofran  was given and IV NS 500 mL was provided.  During the hospitalization, the patient was noted to be hypotensive.  She was started on Levophed for short period of time.  She was subsequently weaned off.  Blood cultures remained negative.  The patient was started on empiric  cefepime  initially which was transitioned to Zosyn.  She continued to be neutropenic.  This was felt to be secondary to her recently immunotherapy.  She continued to have diarrhea.  Imodium  was started with some improvement.  Medical oncology was consulted to see the patient.  They recommended continued supportive care at this point.  Nephrology was consulted.  The patient was continued on maintenance dialysis.  Palliative medicine was consulted.  After discussion with the patient and husband, a decision was made to take the patient home with hospice care.  Assessment and Plan: Severe Neutropenia s/p recent chemotherapy Neutropenic Fever  Severe sepsis with septic shock  -- ordered blood cultures x 2-- NGTD -- broad spectrum antibiotics started after blood cultures obtained -- pt has received a dose of pegfilgrastim  on 06/06/24 thru the oncology center  -- hopefully her ANC will start to improve --ANC 92 on day of dc -- fortunately BPs have started to improve with IV fluid bolus given--weaned off levophed -- portable CXR --indeterminate given extensive metastatic disease -- acetaminophen  ordered for fever  -- pt started on IV norepinephrine infusion x approx 24 hours due to hypotension--weaned off 10/28am -- discussed with her oncologist Dr. Davonna consulted -- Pt received GCSF injection at cancer center on 10/24    Rectal Pain  Prolapsed hemorrhoids -- has been severe from prolapsed hemorrhoids -- pain is better controlled  -- topical treatments ordered for hemorrhoids -- IV pain meds ordered PRN and she currently is wearing a fentanyl  patch    Adult Failure to thrive -- pt does not seem to be tolerating immunotherapy well -- remains  full code  -- recent palliative consult and they were not willing to talk goals of care, but discussed with husband again today and he is agreeable to a palliative consult today   Generalized Weakness -- supportive measures for now -- very low likelihood  for meaningful recovery given advanced malignancy   ESRD on HD (MWF) -- consult to nephrology for inpatient HD on 10/27  -- pt tolerated HD on 10/27 without pressors -- if remains full scope care and on pressors may need CRRT if HD is to continue -- palliative consulted to help clarify goals of care  -- received HD 10/29 prior to dc   Chronic hypoxic respiratory failure -- continue supplemental oxygen  support - on 2L at baseline   GERD -- Continue pantoprazole  daily   Diarrhea -- improving  -- Post recent sacituzumab govitecan  -- Continue supportive measures for now -- if persists, start prednisone     Essential hypertension -- With current hypotension, holding all blood pressure lowering medications   Hyperlipidemia -- Resumed atorvastatin    CAD nonobstructive -- Continue medical management with aspirin  and atorvastatin  -- holding imdur  and bisoprolol due to hypotension  -- no chest pain   Macrocytic anemia -- B12 and folic acid  levels within normal limits   Metastatic breast cancer --Currently on sacituzumab govitecan.  Received 2 doses so far    Consultants: renal, med onc, palliative Procedures performed: none  Disposition: Home Diet recommendation:  Renal diet DISCHARGE MEDICATION: Allergies as of 06/11/2024       Reactions   Trodelvy [sacituzumab Govitecan-hziy] Other (See Comments)   wheezing   Motrin [ibuprofen] Other (See Comments)   ESRD        Medication List     STOP taking these medications    atorvastatin  20 MG tablet Commonly known as: LIPITOR   nebivolol  10 MG tablet Commonly known as: BYSTOLIC    nystatin 100000 UNIT/ML suspension Commonly known as: MYCOSTATIN   oxyCODONE  5 MG immediate release tablet Commonly known as: Oxy IR/ROXICODONE        TAKE these medications    acetaminophen  500 MG tablet Commonly known as: TYLENOL  Take 1,000 mg by mouth every 8 (eight) hours as needed for moderate pain (pain score 4-6).    albuterol  (2.5 MG/3ML) 0.083% nebulizer solution Commonly known as: PROVENTIL  Take 3 mLs (2.5 mg total) by nebulization every 4 (four) hours as needed for wheezing or shortness of breath.   albuterol  108 (90 Base) MCG/ACT inhaler Commonly known as: VENTOLIN  HFA Inhale 2 puffs into the lungs every 6 (six) hours as needed for wheezing or shortness of breath.   aspirin  EC 81 MG tablet Take 81 mg by mouth daily.   chlorpheniramine-HYDROcodone  10-8 MG/5ML Commonly known as: TUSSIONEX Take 5 mLs by mouth every 12 (twelve) hours as needed (Severe coughing spells.).   cinacalcet  90 MG tablet Commonly known as: SENSIPAR  Take 90 mg by mouth daily.   Compressor/Nebulizer Misc 1 Units by Does not apply route daily as needed.   Dialyvite  800-Zinc  15 0.8 MG Tabs Take 1 tablet by mouth daily.   diphenoxylate -atropine  2.5-0.025 MG tablet Commonly known as: LOMOTIL  Take 1 tablet by mouth 4 (four) times daily as needed for diarrhea or loose stools.   fentaNYL  50 MCG/HR Commonly known as: DURAGESIC  Place 1 patch onto the skin every 3 (three) days.   fluconazole  100 MG tablet Commonly known as: DIFLUCAN  Take 1 tablet (100 mg total) by mouth every other day.   gabapentin  300 MG capsule Commonly known as:  NEURONTIN  TAKE 1 CAPSULE(300 MG) BY MOUTH AT BEDTIME   guaiFENesin -codeine  100-10 MG/5ML syrup Take 5 mLs by mouth every 12 (twelve) hours as needed for cough.   hydrocortisone  2.5 % rectal cream Commonly known as: ANUSOL -HC Place rectally 4 (four) times daily.   isosorbide  mononitrate 30 MG 24 hr tablet Commonly known as: IMDUR  Take 0.5 tablets (15 mg total) by mouth daily.   lanthanum  1000 MG chewable tablet Commonly known as: FOSRENOL  Chew 2,000-3,000 mg by mouth See admin instructions. Take 3 tablets (3000 mg) by mouth with meals and take 2 tablets (2000 mg) with snacks   lidocaine  2 % solution Commonly known as: XYLOCAINE  Use as directed 15 mLs in the mouth or throat every  4 (four) hours as needed for mouth pain (Mix 1:1 with Maalox and swish and spit).   lidocaine -prilocaine  cream Commonly known as: EMLA  Apply to affected area once   loperamide  2 MG tablet Commonly known as: IMODIUM  A-D Take 2 mg by mouth 4 (four) times daily as needed for diarrhea or loose stools.   loperamide  2 MG capsule Commonly known as: IMODIUM  Take 2 tabs by mouth with first loose stool, then 1 tab with each additional loose stool as needed. Do not exceed 8 tabs in a 24-hour period   OLANZapine  5 MG tablet Commonly known as: ZyPREXA  Take 1 tablet (5 mg total) by mouth at bedtime.   ondansetron  4 MG disintegrating tablet Commonly known as: ZOFRAN -ODT Take 1 tablet by mouth every 8 (eight) hours as needed for vomiting or nausea.   ondansetron  8 MG tablet Commonly known as: Zofran  Take 1 tablet (8 mg total) by mouth every 8 (eight) hours as needed for nausea or vomiting. Start on the third day after chemotherapy.   pantoprazole  40 MG tablet Commonly known as: PROTONIX  Take 1 tablet (40 mg total) by mouth 2 (two) times daily before a meal.   phenol 1.4 % Liqd Commonly known as: CHLORASEPTIC Use as directed 1 spray in the mouth or throat as needed for throat irritation / pain.   prochlorperazine  10 MG tablet Commonly known as: COMPAZINE  Take 1 tablet (10 mg total) by mouth every 6 (six) hours as needed for nausea or vomiting.               Durable Medical Equipment  (From admission, onward)           Start     Ordered   06/10/24 1025  For home use only DME Hospital bed  Once       Question Answer Comment  Length of Need Lifetime   The above medical condition requires: Patient requires the ability to reposition frequently   Bed type Semi-electric      06/10/24 1024            Discharge Exam: Filed Weights   06/09/24 0600 06/09/24 1215 06/09/24 1615  Weight: 81.4 kg 81.4 kg 78.7 kg   HEENT:  [place]/AT, No thrush, no icterus CV:  RRR, no rub, no S3,  no S4 Lung:  scattered bilateral rales Abd:  soft/+BS, NT Ext:  + UE edema, no lymphangitis, no synovitis, no rash   Condition at discharge: stable  The results of significant diagnostics from this hospitalization (including imaging, microbiology, ancillary and laboratory) are listed below for reference.   Imaging Studies: DG Chest Port 1 View Result Date: 06/08/2024 CLINICAL DATA:  Sepsis. EXAM: PORTABLE CHEST 1 VIEW COMPARISON:  Radiograph and CT 05/23/2024 FINDINGS: Right chest port remains in  place. Extensive pulmonary metastasis again seen. There is overall increase in heterogeneous bilateral lung opacities. Stable heart size and mediastinal contours. Right pleural effusion on CT is not well demonstrated by radiograph. IMPRESSION: Extensive pulmonary metastasis. Overall increase in heterogeneous bilateral lung opacities, progression in disease, superimposed infection or edema. Electronically Signed   By: Andrea Gasman M.D.   On: 06/08/2024 13:53   CT ABDOMEN PELVIS W CONTRAST Result Date: 06/08/2024 EXAM: CT ABDOMEN AND PELVIS WITH CONTRAST 06/08/2024 03:05:08 AM TECHNIQUE: CT of the abdomen and pelvis was performed with the administration of 100 mL of iohexol  (OMNIPAQUE ) 300 MG/ML solution. Multiplanar reformatted images are provided for review. Automated exposure control, iterative reconstruction, and/or weight-based adjustment of the mA/kV was utilized to reduce the radiation dose to as low as reasonably achievable. COMPARISON: PET CT dated 05/22/2024. CLINICAL HISTORY: Abdominal pain, acute, nonlocalized. Pt with c/o abdominal cramping and loose stools (5) since Chemo on Thurs and dialysis on Fri. Husband states pt has had 2 Imodium  prior to arrival. FINDINGS: LOWER CHEST: Innumerable pulmonary metastases, similar to recent PET. 15 mm short axis left axillary nodal metastasis (image 1), incompletely visualized, grossly unchanged. LIVER: 14 mm inferior right hepatic metastasis (image 19),  unchanged. GALLBLADDER AND BILE DUCTS: Status post cholecystectomy. No biliary ductal dilatation. SPLEEN: No acute abnormality. PANCREAS: No acute abnormality. ADRENAL GLANDS: No acute abnormality. KIDNEYS, URETERS AND BLADDER: Severe bilateral renal cortical scarring/atrophy. Numerous bilateral renal calculi measuring up to 7 mm on the right. Small simple bilateral renal cysts measuring up to 15 mm on the left, benign. Per consensus, no follow-up is needed for simple Bosniak type 1 and 2 renal cysts. No hydronephrosis. No perinephric or periureteral stranding. Urinary bladder is unremarkable. GI AND BOWEL: Stomach demonstrates no acute abnormality. There is no bowel obstruction. PERITONEUM AND RETROPERITONEUM: No ascites. No free air. VASCULATURE: Atherosclerotic calcifications of the abdominal aorta and branch vessels, although patent. Aorta is normal in caliber. LYMPH NODES: No lymphadenopathy. REPRODUCTIVE ORGANS: Status post hysterectomy. BONES AND SOFT TISSUES: No acute osseous abnormality. No focal soft tissue abnormality. IMPRESSION: 1. No acute findings. 2. Innumerable pulmonary metastases, left axillary nodal metastasis, and inferior right hepatic metastasis, similar to recent PET. 3. Additional ancillary findings, as above. Electronically signed by: Pinkie Pebbles MD 06/08/2024 03:12 AM EDT RP Workstation: [id number]   NM PET Image Restag (PS) Skull Base To Thigh Result Date: 05/24/2024 CLINICAL DATA:  Subsequent treatment strategy for breast cancer. EXAM: NUCLEAR MEDICINE PET SKULL BASE TO THIGH TECHNIQUE: 8.8 mCi F-18 FDG was injected intravenously. Full-ring PET imaging was performed from the skull base to thigh after the radiotracer. CT data was obtained and used for attenuation correction and anatomic localization. Fasting blood glucose: 122 mg/dl COMPARISON:  93/80/7974. FINDINGS: Mediastinal blood pool activity: SUV max 2.7 Liver activity: SUV max NA NECK: Low left internal jugular lymph  hypermetabolic lymph nodes, SUV max 8.0. Incidental CT findings: None. CHEST: New hypermetabolic nodules and masses throughout the lungs with hypermetabolic mediastinal, hilar, left subpectoral/axillary and extrapleural adenopathy. Incidental CT findings: Right IJ Port-A-Cath terminates in the low SVC. Atherosclerotic calcification of the aorta and coronary arteries. Heart is enlarged. There may be trace right pleural fluid. No pericardial effusion. ABDOMEN/PELVIS: New right hepatic lobe metastasis measures 1.7 cm, SUV max 12.0. New hypermetabolic retrocaval 8 mm lymph node (202/88), SUV max 7.5. Incidental CT findings: Cholecystectomy. Kidneys are atrophic with small high and low-attenuation lesions, too small to characterize. Numerous bilateral renal stones. SKELETON: Focal intercostal metastasis between the posterior right  eleventh and twelfth ribs, SUV max 6.6. No definite osseous hypermetabolism. Incidental CT findings: Minimal degenerative change in the spine. IMPRESSION: 1. Marked progression of metastatic breast cancer as evidenced by widespread pulmonary metastatic disease, progressive low neck, chest and upper abdominal adenopathy, new liver metastasis and new intercostal metastasis. 2. Bilateral renal stones. Electronically Signed   By: Newell Eke M.D.   On: 05/24/2024 11:21   CT Angio Chest PE W and/or Wo Contrast Result Date: 05/23/2024 CLINICAL DATA:  Shortness of breath since last night. Stage IV breast cancer. EXAM: CT ANGIOGRAPHY CHEST WITH CONTRAST TECHNIQUE: Multidetector CT imaging of the chest was performed using the standard protocol during bolus administration of intravenous contrast. Multiplanar CT image reconstructions and MIPs were obtained to evaluate the vascular anatomy. RADIATION DOSE REDUCTION: This exam was performed according to the departmental dose-optimization program which includes automated exposure control, adjustment of the mA and/or kV according to patient size  and/or use of iterative reconstruction technique. CONTRAST:  75mL OMNIPAQUE  IOHEXOL  350 MG/ML SOLN COMPARISON:  Chest radiographs dated 05/23/2024 and 05/12/2024. PET-CT dated 05/22/2024 and chest CT dated 05/12/2024. FINDINGS: Cardiovascular: Normally opacified pulmonary arteries with no pulmonary arterial filling defects seen. Breathing motion artifacts in the lower lung zones. Atheromatous calcifications, including the coronary arteries and aorta. Borderline enlarged heart. No pericardial effusion. Mediastinum/Nodes: Multiple enlarged mediastinal and right hilar nodes without significant change since 05/12/2024. The previously demonstrated 1.8 cm short axis precarinal node has a corresponding short axis diameter today of 1.8 cm on image number 32/5. Unremarkable thyroid  gland, trachea and esophagus. Lungs/Pleura: Marked increase in size and number of lung nodules and masses throughout both lungs. The largest masses in the posterior right upper lobe, measuring 6.8 x 3.8 cm on image number 46/7, previously 5.9 x 3.5 cm on 05/12/2024. Small right pleural effusion with improvement. Upper Abdomen: Poorly defined right lobe liver mass measuring 1.4 cm on image number 82/5, previously 1.2 cm. Musculoskeletal: Mild thoracic spine degenerative changes. No evidence of bony metastatic disease. Review of the MIP images confirms the above findings. IMPRESSION: 1. No pulmonary emboli. 2. Marked increase in size and number of lung nodules and masses throughout both lungs, compatible with progressive metastatic disease. 3. Stable mediastinal and right hilar adenopathy compatible with metastatic adenopathy. 4. Slight increase in size of a right lobe liver mass suspicious for a liver metastasis. 5. Small right pleural effusion with improvement. 6. Calcific coronary artery and aortic atherosclerosis. Aortic Atherosclerosis (ICD10-I70.0). Electronically Signed   By: Elspeth Bathe M.D.   On: 05/23/2024 09:10   DG Chest Portable 1  View Result Date: 05/23/2024 EXAM: 1 VIEW(S) XRAY OF THE CHEST 05/23/2024 05:32:00 AM COMPARISON: Chest CT and radiograph 05/12/2024. CLINICAL HISTORY: 63 year old female with metastatic breast cancer, evaluation for SOB, persistent coughing, and worsening SHOB. FINDINGS: LINES, TUBES AND DEVICES: Stable right chest power port. LUNGS AND PLEURA: Extensive bilateral pulmonary masses in keeping with widespread lung metastases. Lung volumes and ventilation are not significantly changed from last month. Pneumothorax identified. No pulmonary edema. No pleural effusion. HEART AND MEDIASTINUM: Mediastinal contours are not significantly changed from last month. No acute abnormality of the cardiac silhouette. BONES AND SOFT TISSUES: Stable right chest wall surgical clips. No acute osseous abnormality. UPPER ABDOMEN: Paucity of bowel gas. IMPRESSION: 1. Extensive pulmonary metastatic disease. Stable ventilation from last month. No new cardiopulmonary abnormality. Electronically signed by: Helayne Hurst MD 05/23/2024 06:05 AM EDT RP Workstation: [id number]   CT CHEST W CONTRAST Result Date: 05/12/2024 CLINICAL  DATA:  Breast cancer, invasive, stage IV, initial workup. * Tracking Code: BO * EXAM: CT CHEST WITH CONTRAST TECHNIQUE: Multidetector CT imaging of the chest was performed during intravenous contrast administration. RADIATION DOSE REDUCTION: This exam was performed according to the departmental dose-optimization program which includes automated exposure control, adjustment of the mA and/or kV according to patient size and/or use of iterative reconstruction technique. CONTRAST:  75mL OMNIPAQUE  IOHEXOL  300 MG/ML  SOLN COMPARISON:  Nuclear medicine PET scan from 01/31/2024 and CT scan chest from 01/24/2024. FINDINGS: Cardiovascular: Normal cardiac size. No pericardial effusion. No aortic aneurysm. There are coronary artery calcifications, in keeping with coronary artery disease. There are also mild peripheral  atherosclerotic vascular calcifications of thoracic aorta and its major branches. Mediastinum/Nodes: Visualized thyroid  gland appears grossly unremarkable. No solid / cystic mediastinal masses. The esophagus is nondistended precluding optimal assessment. There multiple new/enlarging mediastinal, bilateral hilar and left axillary lymph nodes with largest in the left axilla and supra carinal region both measuring up to 1.8 x 2.8 cm, compatible with worsening metastases. No significant right axillary lymphadenopathy by size criteria. There are multiple surgical staples in the right axillary region. Lungs/Pleura: Since the prior study there are extensive new solid pulmonary nodules ranging in size from few mm up to 5.9 cm, compatible with worsening metastatic disease. There is small-to-moderate right pleural effusion. No left pleural effusion. No pneumothorax on either side. There also peripheral subsegmental atelectatic changes in the middle lobe. Upper Abdomen: There is a new 1.0 x 1.2 cm ill-defined hypoattenuating lesion in the right hepatic lobe segment 6/7, which is incompletely characterized on the current exam but concerning for metastasis in the given clinical setting. Remaining visualized upper abdominal viscera within normal limits. Musculoskeletal: A CT Port-a-Cath is seen in the right upper chest wall with the catheter terminating in the lower portion of superior vena cava. Visualized soft tissues of the chest wall are otherwise grossly unremarkable. No suspicious osseous lesions. There are mild multilevel degenerative changes in the visualized spine. IMPRESSION: 1. Since the prior study, there are extensive new solid pulmonary nodules, mediastinal, bilateral hilar and left axillary lymphadenopathy, compatible with worsening metastatic disease. 2. There is also a new 1.0 x 1.2 cm ill-defined hypoattenuating lesion in the right hepatic lobe, which is incompletely characterized on the current exam but  concerning for metastasis in the given clinical setting. 3. New small-to-moderate right pleural effusion. 4. Multiple other nonacute observations, as described above. Aortic Atherosclerosis (ICD10-I70.0). Electronically Signed   By: Ree Molt M.D.   On: 05/12/2024 15:25    Microbiology: Results for orders placed or performed during the hospital encounter of 06/08/24  Culture, blood (Routine X 2) w Reflex to ID Panel     Status: None (Preliminary result)   Collection Time: 06/08/24 10:25 AM   Specimen: BLOOD  Result Value Ref Range Status   Specimen Description BLOOD BLOOD RIGHT ARM  Final   Special Requests   Final    AEROBIC BOTTLE ONLY Blood Culture results may not be optimal due to an inadequate volume of blood received in culture bottles   Culture   Final    NO GROWTH 3 DAYS Performed at [facility], [address] [place], [place] [address]    Report Status PENDING  Incomplete  Culture, blood (Routine X 2) w Reflex to ID Panel     Status: None (Preliminary result)   Collection Time: 06/08/24 10:25 AM   Specimen: BLOOD  Result Value Ref Range Status   Specimen  Description BLOOD BLOOD RIGHT HAND  Final   Special Requests   Final    AEROBIC BOTTLE ONLY Blood Culture results may not be optimal due to an inadequate volume of blood received in culture bottles   Culture   Final    NO GROWTH 3 DAYS Performed at [facility], [address], [place], [place] [address]    Report Status PENDING  Incomplete  MRSA Next Gen by PCR, Nasal     Status: None   Collection Time: 06/08/24 10:28 PM   Specimen: Nasal Mucosa; Nasal Swab  Result Value Ref Range Status   MRSA by PCR Next Gen NOT DETECTED NOT DETECTED Final    Comment: (NOTE) The GeneXpert MRSA Assay (FDA approved for NASAL specimens only), is one component of a comprehensive MRSA colonization surveillance program. It is not intended to diagnose MRSA infection nor to guide or monitor treatment for MRSA infections. Test  performance is not FDA approved in patients less than 11 years old. Performed at [facility], [address], [place], [place] [address]    *Note: Due to a large number of results and/or encounters for the requested time period, some results have not been displayed. A complete set of results can be found in Results Review.    Labs: CBC: Recent Labs  Lab 06/08/24 0205 06/08/24 1131 06/09/24 0412 06/10/24 0407 06/10/24 1936 06/11/24 0427  WBC 0.8* 0.3* 0.2* 0.4*  --  0.4*  NEUTROABS 0.2* 0.2* 0.0* 0.1*  --  0.1*  HGB 8.8* 8.0* 7.3* 6.8* 8.1* 7.9*  HCT 28.2* 26.1* 23.4* 21.9* 25.3* 24.9*  MCV 113.3* 115.5* 113.6* 111.2*  --  106.0*  PLT 129* 116* 94* 88*  --  60*   Basic Metabolic Panel: Recent Labs  Lab 06/05/24 0834 06/08/24 0205 06/09/24 0412 06/10/24 0407 06/11/24 0427  NA 138 140 138 138 140  K 3.7 4.4 5.5* 4.1 3.8  CL 96* 96* 97* 95* 97*  CO2 28 24 18* 25 24  GLUCOSE 129* 135* 90 134* 93  BUN 53* 75* 87* 47* 67*  CREATININE 7.29* 8.97* 10.40* 6.38* 8.16*  CALCIUM  8.6* 8.2* 8.5* 8.9 8.8*  MG 2.2 2.5*  --   --   --   PHOS 7.4* 10.3*  --  7.5* 8.0*   Liver Function Tests: Recent Labs  Lab 06/05/24 0834 06/08/24 0205 06/09/24 0412 06/10/24 0407 06/11/24 0427  AST 24 21 15   --   --   ALT 21 19 14   --   --   ALKPHOS 90 83 63  --   --   BILITOT 0.3 0.3 0.3  --   --   PROT 5.7* 5.6* 4.9*  --   --   ALBUMIN 3.0* 3.0* 2.6* 2.8* 2.6*   CBG: No results for input(s): GLUCAP in the last 168 hours.  Discharge time spent: greater than 30 minutes.  Signed: Alm Schneider, MD [facility] 06/11/2024

## 2024-06-11 NOTE — Procedures (Signed)
 I was present at this dialysis session. I have reviewed the session itself and made appropriate changes.   Vital signs in last 24 hours:  Temp:  [97.4 F (36.3 C)-98.9 F (37.2 C)] 97.7 F (36.5 C) (10/29 0826) Pulse Rate:  [89-102] 90 (10/29 0900) Resp:  [12-20] 16 (10/29 0900) BP: (85-122)/(50-72) 102/64 (10/29 0900) SpO2:  [96 %-100 %] 100 % (10/29 0900) Weight:  [78.7 kg] 78.7 kg (10/29 0825) Weight change:  Filed Weights   06/09/24 1215 06/09/24 1615 06/11/24 0825  Weight: 81.4 kg 78.7 kg 78.7 kg    Recent Labs  Lab 06/11/24 0427  NA 140  K 3.8  CL 97*  CO2 24  GLUCOSE 93  BUN 67*  CREATININE 8.16*  CALCIUM  8.8*  PHOS 8.0*    Recent Labs  Lab 06/09/24 0412 06/10/24 0407 06/10/24 1936 06/11/24 0427  WBC 0.2* 0.4*  --  0.4*  NEUTROABS 0.0* 0.1*  --  0.1*  HGB 7.3* 6.8* 8.1* 7.9*  HCT 23.4* 21.9* 25.3* 24.9*  MCV 113.6* 111.2*  --  106.0*  PLT 94* 88*  --  60*    Scheduled Meds:  aspirin  EC  81 mg Oral Daily   Chlorhexidine  Gluconate Cloth  6 each Topical Daily   Chlorhexidine  Gluconate Cloth  6 each Topical Q0600   cinacalcet   90 mg Oral QPM   feeding supplement  237 mL Oral BID BM   fentaNYL   1 patch Transdermal Q72H   fluconazole   100 mg Oral QODAY   heparin  lock flush  500 Units Intravenous Once   hydrocortisone    Rectal QID   lanthanum   500 mg Oral TID WC   nystatin  5 mL Oral QID   OLANZapine   5 mg Oral QHS   pantoprazole   40 mg Oral q1800   Continuous Infusions:  anticoagulant sodium citrate     piperacillin-tazobactam (ZOSYN)  IV 2.25 g (06/11/24 0616)   PRN Meds:.acetaminophen  **OR** acetaminophen , albuterol , anticoagulant sodium citrate, heparin , HYDROmorphone  (DILAUDID ) injection, lidocaine , lidocaine  (PF), lidocaine -prilocaine , loperamide , ondansetron  **OR** ondansetron  (ZOFRAN ) IV, pentafluoroprop-tetrafluoroeth, witch hazel-glycerin    Assessment/Recommendations:   ESRD -outpatient HD orders: [facility] MWF. F180. EDW 79.7kg.  aVF  15-gauge, flow rates: 400/800.  2K/2.5 calcium .  Heparin : 2100 units bolus.  Meds: Aranesp  160 mcg once a week (last dose 10/24), Venofer  50 mg once a week, calcitriol  1.5 mcg 3 times weekly - Seen on HD today off of levophed and will keep even.  She is planning on going home with hospice today after HD.  I doubt that she will be able to continue with outpatient HD given her current functional status and will likely expire at home.     Septic shock -per primary -on broad spectrum abx -started on levophed yesterday but weaned off.   Neutropenia Neutropenic fevers -per primary service   Metastatic breast cancer Extensive pulmonary metastasis -appreciate hemonc and palliative care input -plan is to go home tomorrow with hospice services and husband has already made her DNR   Rectal pain Prolapsed hemorrhoids -pain mgmt per primary service   Diarrhea -supportive care, consider checking for Cdiff   Hyperkalemia -K 5.5, improved with HD yesterday   Acidosis -secondary to ESRD and diarrhea, will attempt to manage with HD   FTT -secondary to malignancy and chemo -per primary   Volume/ history of hypertension  -Hold any BP meds. UF as hemodynamics tolerates, will likely not be able to pull much today given soft BPs   Anemia of Chronic Kidney  Disease Pancytopenia Hemoglobin 6.8, holding ESA in the context of advancing malignancy and on chemo  -received blood transfusion yesterday morning   Secondary Hyperparathyroidism/Hyperphosphatemia - resume home binders, takes lanthanum  at home but utilizing a lower dose -renal diet  -on sensipar    Disposition -poor overall prognosis and Megan [name] has decided to go home with hospice but does want a session of dialysis today and home afterwards.  Appreciate Palliative care's assistance.    Fairy DELENA Sellar,  MD 06/11/2024, 10:11 AM

## 2024-06-11 NOTE — Progress Notes (Signed)
 Discharge summary provided to patient spouse in envelope. Port de-accessed with no complications. IV removed with no complications. All dressings (port bandaid, gauze from IV site and gauze on fistula) clean, dry and intact upon discharge. DNR form given to EMS to give to spouse when arrive at home. Patient discharged on 2L O2 via [place] for home via EMS. Patient spouse, Lynwood, took all patient belongings home.

## 2024-06-11 NOTE — TOC Transition Note (Signed)
 Transition of Care [facility]) - Discharge Note   Patient Details  Name: Megan [name] MRN: [id number] Date of Birth: 11-Oct-1960  Transition of Care [facility]) CM/SW Contact:  Sharlyne Stabs, RN Phone Number: 06/11/2024, 10:08 AM   Clinical Narrative:   [facility] confirmed with spouse that the DME has been delivered. Patient in HD, Palliative and MD at bedside. DC summary sent to [facility]. Med necessity printed. RN will updated IPCM when to call EMS. Duaine will have Hospice RN meet the family at home.    Final next level of care: Home w Hospice Care Barriers to Discharge: Barriers Resolved   Patient Goals and CMS Choice Patient states their goals for this hospitalization and ongoing recovery are:: return home with hospice   Discharge Placement               Patient to be transferred to facility by: EMS Name of family member notified: Spouse Patient and family notified of of transfer: 06/11/24  Discharge Plan and Services Additional resources added to the After Visit Summary for   In-house Referral: Clinical Social Work Discharge Planning Services: NA Post Acute Care Choice: Resumption of Svcs/PTA Provider               Social Drivers of Health (SDOH) Interventions SDOH Screenings   Food Insecurity: No Food Insecurity (06/08/2024)  Housing: Low Risk  (06/08/2024)  Transportation Needs: No Transportation Needs (06/08/2024)  Utilities: Not At Risk (06/08/2024)  Alcohol Screen: Low Risk  (09/20/2023)  Depression (PHQ2-9): Low Risk  (06/05/2024)  Financial Resource Strain: Low Risk  (09/20/2023)  Physical Activity: Inactive (09/20/2023)  Social Connections: Unknown (06/08/2024)  Stress: No Stress Concern Present (09/20/2023)  Tobacco Use: Low Risk  (06/08/2024)    Readmission Risk Interventions    06/09/2024    4:05 PM 06/08/2024    8:34 AM 05/24/2024    2:23 PM  Readmission Risk Prevention Plan  Transportation Screening Complete Complete Complete  Medication Review  Oceanographer) Complete Complete Complete  PCP or Specialist appointment within 3-5 days of discharge  Complete Not Complete  HRI or Home Care Consult Complete Complete Complete  SW Recovery Care/Counseling Consult Complete Complete Complete  Palliative Care Screening Not Applicable Not Applicable Not Applicable  Skilled Nursing Facility Not Applicable Not Applicable Not Applicable

## 2024-06-11 NOTE — Plan of Care (Signed)
  Problem: Skin Integrity: Goal: Risk for impaired skin integrity will decrease Outcome: Progressing   Problem: Fluid Volume: Goal: Hemodynamic stability will improve Outcome: Progressing   Problem: Respiratory: Goal: Ability to maintain adequate ventilation will improve Outcome: Progressing

## 2024-06-11 NOTE — Progress Notes (Signed)
 The patient has completed dialysis treatment without issue. The patient passed one episode of dark green watery stool, approximately 500 ml.  06/11/24 1155  Vitals  Temp 97.6 F (36.4 C)  Temp Source Oral  BP 107/62  BP Location Right Arm  BP Method Automatic  Patient Position (if appropriate) Lying  Pulse Rate 98  Resp 18  During Treatment Monitoring  Intra-Hemodialysis Comments Tx completed  Post Treatment  Dialyzer Clearance Lightly streaked  Hemodialysis Intake (mL) 0 mL  Liters Processed 82  Fluid Removed (mL) 0 mL  Tolerated HD Treatment Yes  Post-Hemodialysis Comments see notes  AVG/AVF Arterial Site Held (minutes) 5 minutes  AVG/AVF Venous Site Held (minutes) 5 minutes  Fistula / Graft Left Forearm Arteriovenous fistula  No Placement Date or Time found.   Orientation: Left  Access Location: Forearm  Access Type: Arteriovenous fistula  Site Condition No complications  Fistula / Graft Assessment Present;Thrill;Bruit  Status Deaccessed  Needle Size 15  Drainage Description None

## 2024-06-11 NOTE — Progress Notes (Signed)
 Noted that pt left hospital with hospice. Contacted out-pt HD clinic, [facility] to inform of this. No further support needed.   Abby Dialsyis Nav [phone]

## 2024-06-11 NOTE — Progress Notes (Signed)
 Daily Progress Note   Date: 06/11/2024   Patient Name: Megan [name]  DOB: 1961-06-12  MRN: [id number]  Age / Sex: 63 y.o., female  Attending Physician: Evonnie Lenis, MD Primary Care Physician: Duanne Butler DASEN, MD Admit Date: 06/08/2024 Length of Stay: 3 days  Reason for Follow-up: Establishing goals of care  Past Medical History:  Diagnosis Date   (HFpEF) heart failure with preserved ejection fraction (HCC)    a. 01/2019 Echo: EF 55-60%, mild conc LVH. DD.  Torn MV chordae.   Anemia    Atypical chest pain    a. 08/2018 MV: EF 59%, no ischemia; b. 02/2019 Cath: nonobs dzs.   Blood transfusion without reported diagnosis    Breast cancer (HCC) 10/12/2020   Cataract    ESRD (end stage renal disease) on dialysis [facility])    a. HD T, T, S   Essential hypertension, benign    GERD (gastroesophageal reflux disease)    Headache    Hemorrhoids    Mixed hyperlipidemia    Morbid obesity (HCC)    Non-obstructive CAD (coronary artery disease)    a. 02/2019 CathL LM nl, LAD 54m, LCX nl, RCA 25p, EF 55-65%.   PONV (postoperative nausea and vomiting)    Renal insufficiency    S/P colonoscopy 08/2009   Dr. Rollin: sessile polyp (benign lymphoid), large hemorrhoids, repeat 5-10 years   Temporal arteritis (HCC)    Type 2 diabetes mellitus (HCC)    Wears glasses     Subjective:   Subjective: Chart Reviewed. Updates received. Patient Assessed. Created space and opportunity for patient  and family to explore thoughts and feelings regarding current medical situation.  Today's Discussion: Today before meeting with the patient/family, I reviewed the chart notes including TOC note from yesterday, nephrology note from yesterday, oncology note from yesterday, nursing note from today, discharge summary/hospitalist note from today, TOC note from today, nephrology note from today. I also reviewed vital signs, nursing flowsheets, medication administrations record, labs, and imaging. Labs reviewed include  CBC which shows progressive but stable neutropenia with white count 0.4 and ANC 0.1 in the setting of sepsis, septic shock, cancer on chemotherapy.  Renal function panel shows normal potassium at 3.8, increasing creatinine now at one 8.16 from 6.3 yesterday consistent with ESRD with planned hemodialysis today prior to discharge.  Today saw the patient in hemodialysis, Dr. Oralia from nephrology was present.  We discussed the plan for discharge home with support after dialysis.  After seeing the patient I went to the ICU for further rounds.  I saw the patient's husband and he called me side.  He is concerned about whether hospice is writing.  He shares that oncology spoke with him yesterday and offered that they can dose reduce chemotherapy and follow-up as an outpatient to continue treatment.  He is under the impression that further chemotherapy will help his wife, although he knows that it will not cure her disease.  He is now wondering if hospice is the right way to go.  We discussed that given the severity of her illness including sepsis, neutropenia (resulting from chemotherapy), ESRD requiring pressors for dialysis that into big picture his wife is unlikely to survive regardless of whether she continues chemotherapy.  He is concerned about her suffering on hospice.  I again discussed the role of hospice and how they focus on comfort, peace, dignity and prevent suffering as end-of-life approaches.  He still seems a bit uneasy.  I reached out to the medical team  and spoke with oncology.  We made plans to meet with the patient's  has been together.  Around 11:45 AM Dr. Davonna with oncology, the patient's husband, and myself met at the bedside while patient remained in dialysis for continued discussion.  We spent a significant amount of time talking about cancer treatments and while there are options still available, there is a bigger picture that needs to be considered.  The patient is in ESRD, has  required pressors support for dialysis.  Nephrology does not think she will be a good candidate for continued outpatient dialysis given her functional decline and need for pressors.  We discussed how her life would look moving forward if they opted for continued cancer treatment, including repeated hospital admissions.  We also again discussed hospice care and providing comfort, peace, dignity as end-of-life approaches including no further oncology treatments and no further dialysis.  The patient's husband was clear that he does not want his wife to suffer.  After lengthy discussion, given the totality of the clinical insults she is facing, the patient's husband agreed to continue with the plan for discharge to outpatient hospice care in the home with no further cancer treatment and no further hemodialysis.  He was understandably emotional.  We talked about focusing on the time remaining, spending time leaving his wife and celebrating each moment he gets.  He agreed. I provided emotional and general support through therapeutic listening, empathy, sharing of stories, therapeutic touch, and other techniques. I answered all questions and addressed all concerns to the best of my ability.  ROS Limited due to clinical condition Review of Systems  Unable to perform ROS Constitutional:        Feels poorly overall    Objective:   Primary Diagnoses: Present on Admission:  Diarrhea with dehydration  Severe sepsis with septic shock (HCC)   Vital Signs:  BP 102/64 (BP Location: Right Arm)   Pulse 90   Temp 97.7 F (36.5 C) (Oral)   Resp 16   Ht 5' 2 (1.575 m)   Wt 78.7 kg   SpO2 100%   BMI 31.73 kg/m   Physical Exam Vitals and nursing note reviewed.  Constitutional:      General: She is sleeping. She is not in acute distress.    Appearance: She is ill-appearing.  HENT:     Head: Normocephalic and atraumatic.  Cardiovascular:     Rate and Rhythm: Normal rate.  Pulmonary:     Effort:  Pulmonary effort is normal. No respiratory distress.  Abdominal:     General: Abdomen is flat.  Skin:    General: Skin is warm and dry.  Neurological:     General: No focal deficit present.     Mental Status: She is easily aroused.  Psychiatric:        Mood and Affect: Mood normal.        Behavior: Behavior normal.     Palliative Assessment/Data: 20-30%   Existing Vynca/ACP Documentation: None  Advanced Care Planning:   Existing Vynca/ACP Documentation: None  Primary Decision Maker: NEXT OF KIN  Pertinent diagnosis: Metastatic breast cancer, sepsis, shock, chronic HFpEF, ESRD on HD, respiratory failure with hypoxia  The patient and/or family consented to a voluntary Advance Care Planning Conversation in person. Individuals present for the conversation: The patient's husband Lynwood; Dr. Davonna from oncology; Camellia Kays, NP  Summary of the conversation: We discussed clinical situation, big picture issues, likelihood that she will not live long even with ongoing aggressive  treatment, predicament related to hemodialysis.  We discussed options moving forward from comfort/hospice care versus continued seeking of oncology treatments and dialysis.  Outcome of the conversations and/or documents completed: Complete dialysis today, discharge home with hospice after that.  No further oncology treatments and no further hemodialysis.  I spent 30 minutes providing separately identifiable ACP services with the patient and/or surrogate decision maker in a voluntary, in-person conversation discussing the patient's wishes and goals as detailed in the above note.  Assessment & Plan:   HPI/Patient Profile:  63 y.o. female  with past medical history of hypertension, hyperlipidemia, chronic diastolic heart failure, anemia of chronic disease, ESRD, GERD, COPD with chronic respiratory failure and hypoxia on 2 LPM of oxygen  at baseline, class I obesity, history of nonobstructive coronary artery disease  and metastatic breast cancer who presents to the emergency department for evaluation of diarrhea.  She was admitted on 06/08/2024 with severe neutropenia status post recent chemotherapy, neutropenic fever, severe sepsis with septic shock, rectal pain, prolapsed hemorrhoids, failure to thrive, generalized weakness, ESRD on HD, chronic hypoxic respiratory failure, diarrhea, and others.    Palliative medicine was consulted for GOC conversations.  SUMMARY OF RECOMMENDATIONS   DNR-Limited Continue current scope of care Complete hemodialysis today Discharge home with hospice after dialysis Ongoing emotional support of patient and family Palliative medicine will continue to follow  Symptom Management:  Per primary team Palliative medicine is available to assist as needed  Code Status: DNR - Limited (DNR/DNI)  Prognosis: Hours - Days  Discharge Planning: Home with Hospice  Discussed with: Patient, family, medical team, nursing team, [facility] team  Thank you for allowing us  to participate in the care of ZAIYA ANNUNZIATO PMT will continue to support holistically.  Billing based on MDM: High  Problems Addressed: One acute or chronic illness or injury that poses a threat to life or bodily function  Amount and/or Complexity of Data: Category 1:Review of prior external note(s) from each unique source, Review of the result(s) of each unique test, and Assessment requiring an independent historian(s) and Category 3:Discussion of management or test interpretation with external physician/other qualified health care professional/appropriate source (not separately reported)  Detailed review of medical records (labs, imaging, vital signs), medically appropriate exam, discussed with treatment team, counseling and education to patient, family, & staff, documenting clinical information, medication management, coordination of care  Camellia Kays, NP Palliative Medicine Team  Team Phone # [phone]  (Nights/Weekends)  04/12/2021, 8:17 AM

## 2024-06-11 NOTE — Progress Notes (Signed)
 Oncology progress note  I met with the patient's husband Mr. Arine Foley along with Camellia Kays, NP from palliative care.  Patient was in dialysis at this time.  We discussed patient's prognosis and goals at this time.  Patient requested to go home.  She is requiring Levophed during dialysis for blood pressure support which cannot be done outpatient.  Based on our review of Dr. Alida note, patient is not a candidate for outpatient hemodialysis anymore.  Considering all of this and multiple comorbidities along with metastatic breast cancer, after extensive discussion, patient has been agreed on home with hospice at this time.  This aligns with patient's goals at this time.  Please to reach out with any questions or concerns  Mickiel Dry, MD Hematology/Oncology [facility]

## 2024-06-11 NOTE — Telephone Encounter (Signed)
 I left voicemail for patient regarding new patient palliative appointment on 06/24/2024. I requested that patient returns call if date/time does not work for her schedule.

## 2024-06-12 ENCOUNTER — Telehealth: Payer: Self-pay | Admitting: *Deleted

## 2024-06-12 ENCOUNTER — Encounter (HOSPITAL_COMMUNITY): Payer: Self-pay | Admitting: Emergency Medicine

## 2024-06-12 ENCOUNTER — Other Ambulatory Visit: Payer: Self-pay

## 2024-06-12 ENCOUNTER — Ambulatory Visit

## 2024-06-12 ENCOUNTER — Telehealth: Payer: Self-pay | Admitting: Nurse Practitioner

## 2024-06-12 ENCOUNTER — Observation Stay (HOSPITAL_COMMUNITY)
Admission: EM | Admit: 2024-06-12 | Discharge: 2024-06-14 | Disposition: A | Discharge status: Home / Self Care | Attending: Family Medicine | Admitting: Family Medicine

## 2024-06-12 DIAGNOSIS — D631 Anemia in chronic kidney disease: Secondary | ICD-10-CM | POA: Diagnosis not present

## 2024-06-12 DIAGNOSIS — C7981 Secondary malignant neoplasm of breast: Secondary | ICD-10-CM | POA: Diagnosis not present

## 2024-06-12 DIAGNOSIS — C801 Malignant (primary) neoplasm, unspecified: Secondary | ICD-10-CM | POA: Diagnosis not present

## 2024-06-12 DIAGNOSIS — I120 Hypertensive chronic kidney disease with stage 5 chronic kidney disease or end stage renal disease: Secondary | ICD-10-CM | POA: Insufficient documentation

## 2024-06-12 DIAGNOSIS — D61818 Other pancytopenia: Secondary | ICD-10-CM | POA: Diagnosis present

## 2024-06-12 DIAGNOSIS — Z7982 Long term (current) use of aspirin: Secondary | ICD-10-CM | POA: Diagnosis not present

## 2024-06-12 DIAGNOSIS — C50911 Malignant neoplasm of unspecified site of right female breast: Secondary | ICD-10-CM | POA: Diagnosis not present

## 2024-06-12 DIAGNOSIS — R197 Diarrhea, unspecified: Secondary | ICD-10-CM | POA: Diagnosis not present

## 2024-06-12 DIAGNOSIS — I959 Hypotension, unspecified: Secondary | ICD-10-CM | POA: Insufficient documentation

## 2024-06-12 DIAGNOSIS — I10 Essential (primary) hypertension: Secondary | ICD-10-CM | POA: Diagnosis present

## 2024-06-12 DIAGNOSIS — J9610 Chronic respiratory failure, unspecified whether with hypoxia or hypercapnia: Secondary | ICD-10-CM | POA: Diagnosis not present

## 2024-06-12 DIAGNOSIS — R1084 Generalized abdominal pain: Secondary | ICD-10-CM | POA: Diagnosis not present

## 2024-06-12 DIAGNOSIS — E860 Dehydration: Secondary | ICD-10-CM | POA: Diagnosis not present

## 2024-06-12 DIAGNOSIS — Z170 Estrogen receptor positive status [ER+]: Secondary | ICD-10-CM | POA: Diagnosis not present

## 2024-06-12 DIAGNOSIS — Z992 Dependence on renal dialysis: Secondary | ICD-10-CM | POA: Insufficient documentation

## 2024-06-12 DIAGNOSIS — R531 Weakness: Secondary | ICD-10-CM | POA: Diagnosis not present

## 2024-06-12 DIAGNOSIS — R627 Adult failure to thrive: Secondary | ICD-10-CM | POA: Diagnosis not present

## 2024-06-12 DIAGNOSIS — E876 Hypokalemia: Secondary | ICD-10-CM | POA: Diagnosis not present

## 2024-06-12 DIAGNOSIS — Z79899 Other long term (current) drug therapy: Secondary | ICD-10-CM | POA: Insufficient documentation

## 2024-06-12 DIAGNOSIS — N186 End stage renal disease: Secondary | ICD-10-CM | POA: Insufficient documentation

## 2024-06-12 DIAGNOSIS — D708 Other neutropenia: Secondary | ICD-10-CM | POA: Insufficient documentation

## 2024-06-12 DIAGNOSIS — R1010 Upper abdominal pain, unspecified: Secondary | ICD-10-CM | POA: Diagnosis not present

## 2024-06-12 DIAGNOSIS — C50919 Malignant neoplasm of unspecified site of unspecified female breast: Secondary | ICD-10-CM | POA: Diagnosis present

## 2024-06-12 DIAGNOSIS — D649 Anemia, unspecified: Secondary | ICD-10-CM | POA: Diagnosis not present

## 2024-06-12 LAB — CBC WITH DIFFERENTIAL/PLATELET
Abs Immature Granulocytes: 0 K/uL (ref 0.00–0.07)
Basophils Absolute: 0 K/uL (ref 0.0–0.1)
Basophils Relative: 0 %
Eosinophils Absolute: 0 K/uL (ref 0.0–0.5)
Eosinophils Relative: 5 %
HCT: 27.9 % — ABNORMAL LOW (ref 36.0–46.0)
Hemoglobin: 8.8 g/dL — ABNORMAL LOW (ref 12.0–15.0)
Lymphocytes Relative: 71 %
Lymphs Abs: 0.4 K/uL — ABNORMAL LOW (ref 0.7–4.0)
MCH: 33.7 pg (ref 26.0–34.0)
MCHC: 31.5 g/dL (ref 30.0–36.0)
MCV: 106.9 fL — ABNORMAL HIGH (ref 80.0–100.0)
Metamyelocytes Relative: 1 %
Monocytes Absolute: 0 K/uL — ABNORMAL LOW (ref 0.1–1.0)
Monocytes Relative: 6 %
Myelocytes: 1 %
Neutro Abs: 0.1 K/uL — CL (ref 1.7–7.7)
Neutrophils Relative %: 16 %
Platelets: 52 K/uL — ABNORMAL LOW (ref 150–400)
RBC: 2.61 MIL/uL — ABNORMAL LOW (ref 3.87–5.11)
RDW: 21 % — ABNORMAL HIGH (ref 11.5–15.5)
WBC: 0.6 K/uL — CL (ref 4.0–10.5)
nRBC: 0 % (ref 0.0–0.2)

## 2024-06-12 LAB — COMPREHENSIVE METABOLIC PANEL WITH GFR
ALT: 13 U/L (ref 0–44)
AST: 10 U/L — ABNORMAL LOW (ref 15–41)
Albumin: 2.7 g/dL — ABNORMAL LOW (ref 3.5–5.0)
Alkaline Phosphatase: 85 U/L (ref 38–126)
Anion gap: 18 — ABNORMAL HIGH (ref 5–15)
BUN: 56 mg/dL — ABNORMAL HIGH (ref 8–23)
CO2: 26 mmol/L (ref 22–32)
Calcium: 9.2 mg/dL (ref 8.9–10.3)
Chloride: 97 mmol/L — ABNORMAL LOW (ref 98–111)
Creatinine, Ser: 6.75 mg/dL — ABNORMAL HIGH (ref 0.44–1.00)
GFR, Estimated: 6 mL/min — ABNORMAL LOW (ref >60)
Glucose, Bld: 85 mg/dL (ref 70–99)
Potassium: 3.4 mmol/L — ABNORMAL LOW (ref 3.5–5.1)
Sodium: 140 mmol/L (ref 135–145)
Total Bilirubin: 0.9 mg/dL (ref 0.0–1.2)
Total Protein: 5.8 g/dL — ABNORMAL LOW (ref 6.5–8.1)

## 2024-06-12 LAB — LIPASE, BLOOD: Lipase: 48 U/L (ref 11–51)

## 2024-06-12 MED ORDER — ACETAMINOPHEN 325 MG PO TABS
650.0000 mg | ORAL_TABLET | Freq: Four times a day (QID) | ORAL | Status: DC | PRN
  Administered 2024-06-14: 650 mg via ORAL
  Filled 2024-06-12: qty 2

## 2024-06-12 MED ORDER — ALBUTEROL SULFATE (2.5 MG/3ML) 0.083% IN NEBU: 2.5000 mg | INHALATION_SOLUTION | RESPIRATORY_TRACT | Status: DC | PRN

## 2024-06-12 MED ORDER — ONDANSETRON HCL 4 MG PO TABS: 4.0000 mg | ORAL_TABLET | Freq: Four times a day (QID) | ORAL | Status: DC | PRN

## 2024-06-12 MED ORDER — SODIUM CHLORIDE 0.9 % IV BOLUS
500.0000 mL | Freq: Once | INTRAVENOUS | Status: AC
  Administered 2024-06-12: 500 mL via INTRAVENOUS

## 2024-06-12 MED ORDER — HYDROMORPHONE HCL 1 MG/ML IJ SOLN
0.5000 mg | Freq: Once | INTRAMUSCULAR | Status: AC
  Administered 2024-06-12: 0.5 mg via INTRAVENOUS
  Filled 2024-06-12: qty 0.5

## 2024-06-12 MED ORDER — ONDANSETRON HCL 4 MG/2ML IJ SOLN: 4.0000 mg | Freq: Four times a day (QID) | INTRAMUSCULAR | Status: DC | PRN

## 2024-06-12 MED ORDER — CINACALCET HCL 30 MG PO TABS
90.0000 mg | ORAL_TABLET | Freq: Every day | ORAL | Status: DC
  Administered 2024-06-14: 90 mg via ORAL
  Filled 2024-06-12: qty 3

## 2024-06-12 MED ORDER — HYDROMORPHONE HCL 1 MG/ML IJ SOLN
0.5000 mg | INTRAMUSCULAR | Status: DC | PRN
  Administered 2024-06-13: 0.5 mg via INTRAVENOUS
  Filled 2024-06-12: qty 0.5

## 2024-06-12 MED ORDER — DEXTROSE IN LACTATED RINGERS 5 % IV SOLN: INTRAVENOUS | Status: AC

## 2024-06-12 MED ORDER — DIPHENOXYLATE-ATROPINE 2.5-0.025 MG PO TABS
1.0000 | ORAL_TABLET | Freq: Four times a day (QID) | ORAL | Status: DC | PRN
Start: 2024-06-12 — End: 2024-06-14
  Administered 2024-06-14: 1 via ORAL
  Filled 2024-06-12: qty 1

## 2024-06-12 MED ORDER — ACETAMINOPHEN 650 MG RE SUPP: 650.0000 mg | Freq: Four times a day (QID) | RECTAL | Status: DC | PRN

## 2024-06-12 NOTE — ED Notes (Signed)
Placed pt on bed pan.

## 2024-06-12 NOTE — Transitions of Care (Post Inpatient/ED Visit) (Signed)
   06/12/2024  Name: Megan [name] MRN: [id number] DOB: [birth year]  Today's TOC FU Call Status: Today's TOC FU Call Status:: Unsuccessful Call (1st Attempt) Unsuccessful Call (1st Attempt) Date: 06/12/24  Attempted to reach the patient regarding the most recent Inpatient/ED visit.  Follow Up Plan: Additional outreach attempts will be made to reach the patient to complete the Transitions of Care (Post Inpatient/ED visit) call.   Mliss Creed [facility], BSN RN Care Manager/ Transition of Care [place]/ [facility] [phone]

## 2024-06-12 NOTE — ED Provider Notes (Signed)
 [place] EMERGENCY DEPARTMENT AT [facility] Provider Note   CSN: [id number] Arrival date & time: 06/12/24  1454     Patient presents with: Abdominal Pain   Megan [name] is a 63 y.o. female.   63 year old female with a history of breast cancer status postchemotherapy, ESRD on IHD, diabetes, and hyperlipidemia who presents to the emergency department abdominal pain.  Patient was discharged yesterday for abdominal pain and diarrhea.  Had a CT scan on admission that showed innumerable pulmonary metastases with hepatic metastases as well.  Was noted to be neutropenic and was started empirically on antibiotics.  No additional source was found of her pain this was thought to be potentially related to her immunotherapy as well as her mets.  Ultimately decision was made to discharge her home with hospice.  Per husband no new symptoms but he was surprised to find out that she was not going to get dialysis since she was on hospice and has declined hospice at this time he would like for her to be readmitted for dialysis and palliative care       Prior to Admission medications   Medication Sig Start Date End Date Taking? Authorizing Provider  acetaminophen  (TYLENOL ) 500 MG tablet Take 1,000 mg by mouth every 8 (eight) hours as needed for moderate pain (pain score 4-6).    [provider]  albuterol  (PROVENTIL ) (2.5 MG/3ML) 0.083% nebulizer solution Take 3 mLs (2.5 mg total) by nebulization every 4 (four) hours as needed for wheezing or shortness of breath. 05/15/24 05/15/25  Pearlean Manus, MD  albuterol  (VENTOLIN  HFA) 108 (90 Base) MCG/ACT inhaler Inhale 2 puffs into the lungs every 6 (six) hours as needed for wheezing or shortness of breath. 05/15/24   Pearlean Manus, MD  aspirin  EC 81 MG tablet Take 81 mg by mouth daily.    [provider]  B Complex-C-Zn-Folic Acid  (DIALYVITE  800-ZINC  15) 0.8 MG TABS Take 1 tablet by mouth daily. 06/19/22   [provider]  chlorpheniramine-HYDROcodone  (TUSSIONEX) 10-8 MG/5ML Take 5 mLs by mouth every 12 (twelve) hours as needed (Severe coughing spells.). 05/26/24   Ricky Fines, MD  cinacalcet  (SENSIPAR ) 90 MG tablet Take 90 mg by mouth daily. 03/17/24   [provider]  diphenoxylate -atropine  (LOMOTIL ) 2.5-0.025 MG tablet Take 1 tablet by mouth 4 (four) times daily as needed for diarrhea or loose stools. 05/06/24   Davonna Siad, MD  fentaNYL  (DURAGESIC ) 50 MCG/HR Place 1 patch onto the skin every 3 (three) days. 06/05/24   Kandala, Hyndavi, MD  fluconazole  (DIFLUCAN ) 100 MG tablet Take 1 tablet (100 mg total) by mouth every other day. 06/05/24   Kandala, Hyndavi, MD  gabapentin  (NEURONTIN ) 300 MG capsule TAKE 1 CAPSULE(300 MG) BY MOUTH AT BEDTIME 06/12/24   Duanne Butler DASEN, MD  guaiFENesin -codeine  100-10 MG/5ML syrup Take 5 mLs by mouth every 12 (twelve) hours as needed for cough. 06/03/24   Davonna Siad, MD  hydrocortisone  (ANUSOL -HC) 2.5 % rectal cream Place rectally 4 (four) times daily. 06/11/24   Evonnie Lenis, MD  isosorbide  mononitrate (IMDUR ) 30 MG 24 hr tablet Take 0.5 tablets (15 mg total) by mouth daily. 03/28/24   Alvan Dorn FALCON, MD  lanthanum  (FOSRENOL ) 1000 MG chewable tablet Chew 2,000-3,000 mg by mouth See admin instructions. Take 3 tablets (3000 mg) by mouth with meals and take 2 tablets (2000 mg) with snacks 10/16/19   [provider]  lidocaine  (XYLOCAINE ) 2 % solution Use as directed 15 mLs in the mouth  or throat every 4 (four) hours as needed for mouth pain (Mix 1:1 with Maalox and swish and spit). 05/22/24   Davonna Siad, MD  lidocaine -prilocaine  (EMLA ) cream Apply to affected area once 05/29/24   Kandala, Hyndavi, MD  loperamide  (IMODIUM  A-D) 2 MG tablet Take 2 mg by mouth 4 (four) times daily as needed for diarrhea or loose stools.    [provider]  loperamide  (IMODIUM ) 2 MG capsule Take 2 tabs by mouth with first loose stool, then 1 tab with  each additional loose stool as needed. Do not exceed 8 tabs in a 24-hour period 05/29/24   Kandala, Hyndavi, MD  Nebulizers (COMPRESSOR/NEBULIZER) MISC 1 Units by Does not apply route daily as needed. 05/15/24   Pearlean Manus, MD  OLANZapine  (ZYPREXA ) 5 MG tablet Take 1 tablet (5 mg total) by mouth at bedtime. 05/06/24   Kandala, Hyndavi, MD  ondansetron  (ZOFRAN ) 8 MG tablet Take 1 tablet (8 mg total) by mouth every 8 (eight) hours as needed for nausea or vomiting. Start on the third day after chemotherapy. 05/29/24   Davonna Siad, MD  ondansetron  (ZOFRAN -ODT) 4 MG disintegrating tablet Take 1 tablet by mouth every 8 (eight) hours as needed for vomiting or nausea. 04/23/24   [provider]  pantoprazole  (PROTONIX ) 40 MG tablet Take 1 tablet (40 mg total) by mouth 2 (two) times daily before a meal. 10/08/23   Rourk, Lamar HERO, MD  phenol (CHLORASEPTIC) 1.4 % LIQD Use as directed 1 spray in the mouth or throat as needed for throat irritation / pain. 05/26/24   Ricky Fines, MD  prochlorperazine  (COMPAZINE ) 10 MG tablet Take 1 tablet (10 mg total) by mouth every 6 (six) hours as needed for nausea or vomiting. 05/29/24   Davonna Siad, MD    Allergies: Motrin [ibuprofen] and Trodelvy [sacituzumab govitecan-hziy]    Review of Systems  Updated Vital Signs BP (!) 144/83 (BP Location: Left Leg)   Pulse 97   Temp 98 F (36.7 C)   Resp 20   SpO2 95%   Physical Exam Constitutional:      General: She is not in acute distress.    Appearance: She is ill-appearing.     Comments: Drowsy  Cardiovascular:     Pulses: Normal pulses.     Heart sounds: Normal heart sounds.  Pulmonary:     Effort: Pulmonary effort is normal.  Abdominal:     General: There is distension.     Palpations: There is no mass.     Tenderness: There is abdominal tenderness (RUQ and LLQ). There is no guarding.     (all labs ordered are listed, but only abnormal results are displayed) Labs Reviewed  CBC  WITH DIFFERENTIAL/PLATELET - Abnormal; Notable for the following components:      Result Value   WBC 0.6 (*)    RBC 2.61 (*)    Hemoglobin 8.8 (*)    HCT 27.9 (*)    MCV 106.9 (*)    RDW 21.0 (*)    Platelets 52 (*)    Neutro Abs 0.1 (*)    Lymphs Abs 0.4 (*)    Monocytes Absolute 0.0 (*)    All other components within normal limits  COMPREHENSIVE METABOLIC PANEL WITH GFR - Abnormal; Notable for the following components:   Potassium 3.4 (*)    Chloride 97 (*)    BUN 56 (*)    Creatinine, Ser 6.75 (*)    Total Protein 5.8 (*)    Albumin 2.7 (*)  AST 10 (*)    GFR, Estimated 6 (*)    Anion gap 18 (*)    All other components within normal limits  C DIFFICILE QUICK SCREEN W PCR REFLEX    GASTROINTESTINAL PANEL BY PCR, STOOL (REPLACES STOOL CULTURE)  LIPASE, BLOOD  BASIC METABOLIC PANEL WITH GFR  CBC    EKG: None  Radiology: No results found.   Procedures   Medications Ordered in the ED  HYDROmorphone  (DILAUDID ) injection 0.5 mg (has no administration in time range)  cinacalcet  (SENSIPAR ) tablet 90 mg (has no administration in time range)  diphenoxylate -atropine  (LOMOTIL ) 2.5-0.025 MG per tablet 1 tablet (has no administration in time range)  albuterol  (PROVENTIL ) (2.5 MG/3ML) 0.083% nebulizer solution 2.5 mg (has no administration in time range)  dextrose  5 % in lactated ringers  infusion ( Intravenous New Bag/Given 06/12/24 2047)  acetaminophen  (TYLENOL ) tablet 650 mg (has no administration in time range)    Or  acetaminophen  (TYLENOL ) suppository 650 mg (has no administration in time range)  ondansetron  (ZOFRAN ) tablet 4 mg (has no administration in time range)    Or  ondansetron  (ZOFRAN ) injection 4 mg (has no administration in time range)  HYDROmorphone  (DILAUDID ) injection 0.5 mg (0.5 mg Intravenous Given 06/12/24 1606)  sodium chloride  0.9 % bolus 500 mL (0 mLs Intravenous Stopped 06/12/24 1930)    Clinical Course as of 06/12/24 2314  Thu Jun 12, 2024   1808 Dr Currie from hospitalist consulted.  [RP]  1841 Dr Lollie from nephrology consulted. Recommends coming in for HD and seeing if she could tolerate OP HD. Will have their team see her in the morning.  [RP]    Clinical Course User Index [RP] Yolande Lamar BROCKS, MD                                 Medical Decision Making Amount and/or Complexity of Data Reviewed Labs: ordered.  Risk Prescription drug management. Decision regarding hospitalization.   63 year old female with a history of breast cancer status postchemotherapy, ESRD on IHD, diabetes, and hyperlipidemia who presents to the emergency department abdominal pain.   Initial Ddx:  ESRD, electrolyte abnormality, neutropenic fever, sepsis, diarrhea, dehydration  MDM/Course:  Patient presents emergency department with continued diarrhea and abdominal pain.  She was recently admitted to the hospital for this and was treated for neutropenic fever.  Was discharged home with hospice but it sounds like patient's husband and the patient would like to continue dialysis and breast cancer treatment.  She had lab work that was not significantly changed from prior.  She is very chronically ill-appearing but not in acute distress.  Is having some mild abdominal tenderness to palpation.  Upon re-evaluation was given IV fluids and did not have significant improvement of her weakness.  Given her current state did have an extensive discussion with the patient's husband.  She is too weak to go home at this point in time.  Sounds like he would like to continue with palliative care and still get treatment for her cancer and ESRD.  Discussed with nephrology since she has been having issues with hypotension with dialysis and he recommended bringing her back into the hospital to see if she would tolerate dialysis without any Levophed and if so they might be able to coordinate having her get outpatient dialysis again.  Discussed with hospitalist for  admission  This patient presents to the ED for concern of complaints listed in  HPI, this involves an extensive number of treatment options, and is a complaint that carries with it a high risk of complications and morbidity. Disposition including potential need for admission considered.   Dispo: Admit to Floor  Additional history obtained from spouse Records reviewed Outpatient Clinic Notes The following labs were independently interpreted: Chemistry and show CKD I personally reviewed and interpreted cardiac monitoring: normal sinus rhythm  I personally reviewed and interpreted the pt's EKG: see above for interpretation  I have reviewed the patients home medications and made adjustments as needed Consults: Hospitalist and Nephrology  Portions of this note were generated with Scientist, clinical (histocompatibility and immunogenetics). Dictation errors may occur despite best attempts at proofreading.     Final diagnoses:  ESRD (end stage renal disease) (HCC)  Generalized weakness  Dehydration    ED Discharge Orders     None          Yolande Lamar BROCKS, MD 06/12/24 2314

## 2024-06-12 NOTE — ED Notes (Signed)
 Date and time results received: 06/12/24 1705   Test: WBC Critical Value: 0.6  Test: Absolute Neutrophil Count Critical Value: Automated =0.2  Manual=0.1  Name of Provider Notified: Yolande, [facility]

## 2024-06-12 NOTE — Patient Outreach (Signed)
 06/12/2024  Patient ID: Megan [name], female   DOB: 08-06-1961, 63 y.o.   MRN: [id number]  Spoke with [name], intake coordinator with [facility], they had DME delivered (hospital bed) to patient's home and nurse made a visit to patient's home 06/11/24, pt not admitted to services,  pt/ family declined hospice due to pt wants to continue dialysis.  RN Care Manager will continue to outreach pt for transition of care.  Mliss Creed [facility], BSN RN Care Manager/ Transition of Care [place]/ [facility] [phone]

## 2024-06-12 NOTE — H&P (Signed)
 History and Physical    Megan [name] FMW:[id number] DOB: [birth year] DOA: 06/12/2024  PCP: Duanne Butler DASEN, MD   Patient coming from: Home  I have personally briefly reviewed patient's old medical records in [facility]  HPI: Megan [name] is a 64 y.o. female with medical history significant for metastatic breast cancer, ESRD, diabetes mellitus, HTN, GAVE. Patient presented to the ED with complaints of persistent diarrhea and abdominal pain.  Patient was just discharged home yesterday to begin home hospice today.  Hospitalized 10/26 to 10/29, presented with diarrhea, for severe neutropenia, status post recent chemotherapy, neutropenic fever, and severe sepsis with septic shock. She required norepinephrine drip for about 24 hours.  Treated with broad-spectrum antibiotics.  Blood cultures negative.  CTAP WC -no acute findings, or metastatic findings.  Patient and husband changed her mind today-they no longer want hospice care, rather wants to continue full scope of treatment and explore chemotherapy options.  They tell me they were unaware that patient will be stopping dialysis.  And this was the deal break for them starting hospice care. Since getting home, she has not been able to take anything orally, she is unable to ambulate, she is very weak, with persistent diarrhea and abdominal pain, she has had 3 episodes of diarrhea today.  Blood pressure has not been able to tolerate outpatient dialysis.   ED Course: Temperature 97.5.  Heart rate 90s.  Respiratory rate 17-24.  Blood pressure systolic 120s.   WBC 0.6.  Platelets 52.   Potassium 3.4.  BUN 56. EDP spoke to nephrology, will see in consult for dialysis.  Review of Systems: As per HPI all other systems reviewed and negative.  Past Medical History:  Diagnosis Date   (HFpEF) heart failure with preserved ejection fraction (HCC)    a. 01/2019 Echo: EF 55-60%, mild conc LVH. DD.  Torn MV chordae.   Anemia    Atypical  chest pain    a. 08/2018 MV: EF 59%, no ischemia; b. 02/2019 Cath: nonobs dzs.   Blood transfusion without reported diagnosis    Breast cancer (HCC) 10/12/2020   Cataract    ESRD (end stage renal disease) on dialysis [facility])    a. HD T, T, S   Essential hypertension, benign    GERD (gastroesophageal reflux disease)    Headache    Hemorrhoids    Mixed hyperlipidemia    Morbid obesity (HCC)    Non-obstructive CAD (coronary artery disease)    a. 02/2019 CathL LM nl, LAD 71m, LCX nl, RCA 25p, EF 55-65%.   PONV (postoperative nausea and vomiting)    Renal insufficiency    S/P colonoscopy 08/2009   Dr. Rollin: sessile polyp (benign lymphoid), large hemorrhoids, repeat 5-10 years   Temporal arteritis (HCC)    Type 2 diabetes mellitus (HCC)    Wears glasses     Past Surgical History:  Procedure Laterality Date   A/V SHUNT INTERVENTION Left 04/16/2024   Procedure: A/V SHUNT INTERVENTION;  Surgeon: Pearline Norman RAMAN, MD;  Location: [facility];  Service: Cardiovascular;  Laterality: Left;   ABDOMINAL HYSTERECTOMY     APPENDECTOMY     ARTERY BIOPSY N/A 05/09/2018   Procedure: RIGHT TEMPORAL ARTERY BIOPSY;  Surgeon: Kimble Agent, MD;  Location: [facility] OR;  Service: General;  Laterality: N/A;   BIOPSY  10/04/2023   Procedure: BIOPSY;  Surgeon: Shaaron Lamar HERO, MD;  Location: [facility];  Service: Endoscopy;;   BREAST BIOPSY Right 06/15/2020   Procedure: RIGHT  BREAST BIOPSY;  Surgeon: Mavis Anes, MD;  Location: [facility]S;  Service: General;  Laterality: Right;   CATARACT EXTRACTION W/PHACO Left 02/09/2017   Procedure: CATARACT EXTRACTION PHACO AND INTRAOCULAR LENS PLACEMENT LEFT EYE;  Surgeon: Perley Hamilton, MD;  Location: [facility];  Service: Ophthalmology;  Laterality: Left;  CDE: 4.89   CATARACT EXTRACTION W/PHACO Right 06/04/2017   Procedure: CATARACT EXTRACTION PHACO AND INTRAOCULAR LENS PLACEMENT (IOC);  Surgeon: Perley Hamilton, MD;  Location: [facility];  Service: Ophthalmology;  Laterality: Right;  CDE: 4.12    CHOLECYSTECTOMY  09/29/2011   Procedure: LAPAROSCOPIC CHOLECYSTECTOMY;  Surgeon: Anes DELENA Mavis, MD;  Location: [facility];  Service: General;  Laterality: N/A;   COLONOSCOPY  08/2009   Dr. Rollin: sessile polyp (benign lymphoid), large hemorrhoids, repeat 5-10 years   COLONOSCOPY N/A 06/12/2016   prominent hemorrhoids   COLONOSCOPY WITH PROPOFOL  N/A 06/16/2021   Procedure: COLONOSCOPY WITH PROPOFOL ;  Surgeon: Shaaron Lamar HERO, MD;  [facility];  Service: Endoscopy;  Laterality: N/A;  9:30am (dialysis pt)   ESOPHAGOGASTRODUODENOSCOPY  09/05/2011   MFM:Dfjoo hiatal hernia; remainder of exam normal. No explanation for patient's abdominal pain with today's examination   ESOPHAGOGASTRODUODENOSCOPY N/A 12/17/2013   Dr. Shaaron: gastric erythema, erosion, mild chronic inflammation on path    ESOPHAGOGASTRODUODENOSCOPY N/A 12/31/2023   Procedure: EGD (ESOPHAGOGASTRODUODENOSCOPY);  Surgeon: Wilhelmenia Aloha Raddle., MD;  Location: [facility];  Service: Gastroenterology;  Laterality: N/A;   ESOPHAGOGASTRODUODENOSCOPY (EGD) WITH PROPOFOL  N/A 06/16/2021   Procedure: ESOPHAGOGASTRODUODENOSCOPY (EGD) WITH PROPOFOL ;  Surgeon: Shaaron Lamar HERO, MD;  [facility];  Service: Endoscopy;  Laterality: N/A;   ESOPHAGOGASTRODUODENOSCOPY (EGD) WITH PROPOFOL  N/A 10/04/2023   Procedure: ESOPHAGOGASTRODUODENOSCOPY (EGD) WITH PROPOFOL ;  Surgeon: Shaaron Lamar HERO, MD;  [facility];  Service: Endoscopy;  Laterality: N/A;  200pm, ok rm 1-2, pt knows to arrive at 6:45   EUS N/A 12/31/2023   Procedure: ULTRASOUND, UPPER GI TRACT, ENDOSCOPIC;  Surgeon: Wilhelmenia Aloha Raddle., MD;  Location: [facility];  Service: Gastroenterology;  Laterality: N/A;   EXCISION OF BREAST BIOPSY Right 10/12/2020   Procedure: EXCISION OF RIGHT BREAST BIOPSY;  Surgeon: Mavis Anes, MD;  Location: [facility];  Service: General;  Laterality: Right;   IR DIALY SHUNT INTRO NEEDLE/INTRACATH INITIAL W/IMG LEFT Left 07/17/2023    LAPAROSCOPIC APPENDECTOMY  09/29/2011   Procedure: APPENDECTOMY LAPAROSCOPIC;  Surgeon: Anes DELENA Mavis, MD;  Location: [facility];  Service: General;;  incidental appendectomy   LEFT HEART CATH AND CORONARY ANGIOGRAPHY N/A 02/28/2019   Procedure: LEFT HEART CATH AND CORONARY ANGIOGRAPHY;  Surgeon: Dann Candyce RAMAN, MD;  Location: [facility];  Service: Cardiovascular;  Laterality: N/A;   MASS EXCISION Right 01/18/2023   Procedure: EXCISION MASS, RIGHT CHEST S/P MASTECTOMY;  Surgeon: Mavis Anes, MD;  Location: [facility];  Service: General;  Laterality: Right;   MASTECTOMY MODIFIED RADICAL Right 02/18/2020   Procedure: MASTECTOMY MODIFIED RADICAL;  Surgeon: Mavis Anes, MD;  Location: [facility];  Service: General;  Laterality: Right;   MASTECTOMY, PARTIAL Right 07/13/2020   Procedure: RIGHT PARTIAL MASTECTOMY;  Surgeon: Mavis Anes, MD;  Location: [facility];  Service: General;  Laterality: Right;   PARTIAL MASTECTOMY WITH NEEDLE LOCALIZATION AND AXILLARY SENTINEL LYMPH NODE BX Right 09/18/2018   Procedure: RIGHT PARTIAL MASTECTOMY AFTER NEEDLE LOCALIZATION, SENTINEL LYMPH NODE BIOPSY RIGHT AXILLA;  Surgeon: Mavis Anes, MD;  Location: [facility];  Service: General;  Laterality: Right;   POLYPECTOMY  06/16/2021   Procedure: POLYPECTOMY;  Surgeon: Shaaron Lamar  M, MD;  [facility];  Service: Endoscopy;;   PORT-A-CATH REMOVAL Right 11/29/2023   Procedure: REMOVAL PORT-A-CATH;  Surgeon: Mavis Anes, MD;  Location: [facility];  Service: General;  Laterality: Right;  MINOR PROCEDURE ROOM   PORTACATH PLACEMENT Right 06/07/2023   Procedure: INSERTION PORT-A-CATH, RIGHT  (DIALYSIS ACCESS ON LEFT);  Surgeon: Mavis Anes, MD;  Location: [facility];  Service: General;  Laterality: Right;   PORTACATH PLACEMENT Right 04/03/2024   Procedure: INSERTION, TUNNELED CENTRAL VENOUS DEVICE, WITH PORT;  Surgeon: Mavis Anes, MD;  Location: [facility];  Service: General;  Laterality: Right;   SIMPLE MASTECTOMY WITH  AXILLARY SENTINEL NODE BIOPSY Left 06/15/2020   Procedure: LEFT SIMPLE MASTECTOMY;  Surgeon: Mavis Anes, MD;  Location: [facility];  Service: General;  Laterality: Left;     reports that she has never smoked. She has never been exposed to tobacco smoke. She has never used smokeless tobacco. She reports that she does not drink alcohol and does not use drugs.  Allergies  Allergen Reactions   Motrin [Ibuprofen] Other (See Comments)    End stage renal disease    Trodelvy [Sacituzumab Govitecan-Hziy] Other (See Comments)    Wheezing     Family History  Problem Relation Age of Onset   Hypertension Mother    Coronary artery disease Mother    Diabetes Mother    Heart attack Father    Hypertension Sister    Coronary artery disease Sister    Hypertension Brother    Colon cancer Brother    Heart attack Maternal Grandmother    Heart attack Maternal Grandfather    Heart attack Paternal Grandmother    Heart attack Paternal Grandfather    Hypertension Son    Heart attack Maternal Aunt    Hypertension Maternal Aunt    Diabetes Maternal Aunt    Heart attack Maternal Uncle    Hypertension Maternal Uncle    Diabetes Maternal Uncle    Heart attack Paternal Aunt    Hypertension Paternal Aunt    Diabetes Paternal Aunt    Heart attack Paternal Uncle    Hypertension Paternal Uncle    Diabetes Paternal Uncle    Prior to Admission medications   Medication Sig Start Date End Date Taking? Authorizing Provider  acetaminophen  (TYLENOL ) 500 MG tablet Take 1,000 mg by mouth every 8 (eight) hours as needed for moderate pain (pain score 4-6).    [provider]  albuterol  (PROVENTIL ) (2.5 MG/3ML) 0.083% nebulizer solution Take 3 mLs (2.5 mg total) by nebulization every 4 (four) hours as needed for wheezing or shortness of breath. 05/15/24 05/15/25  Pearlean Manus, MD  albuterol  (VENTOLIN  HFA) 108 (90 Base) MCG/ACT inhaler Inhale 2 puffs into the lungs every 6 (six) hours as needed for wheezing or  shortness of breath. 05/15/24   Pearlean Manus, MD  aspirin  EC 81 MG tablet Take 81 mg by mouth daily.    [provider]  B Complex-C-Zn-Folic Acid  (DIALYVITE  800-ZINC  15) 0.8 MG TABS Take 1 tablet by mouth daily. 06/19/22   [provider]  chlorpheniramine-HYDROcodone  (TUSSIONEX) 10-8 MG/5ML Take 5 mLs by mouth every 12 (twelve) hours as needed (Severe coughing spells.). 05/26/24   Ricky Fines, MD  cinacalcet  (SENSIPAR ) 90 MG tablet Take 90 mg by mouth daily. 03/17/24   [provider]  diphenoxylate -atropine  (LOMOTIL ) 2.5-0.025 MG tablet Take 1 tablet by mouth 4 (four) times daily as needed for diarrhea or loose stools. 05/06/24   Davonna Siad, MD  fentaNYL  (DURAGESIC )  50 MCG/HR Place 1 patch onto the skin every 3 (three) days. 06/05/24   Kandala, Hyndavi, MD  fluconazole  (DIFLUCAN ) 100 MG tablet Take 1 tablet (100 mg total) by mouth every other day. 06/05/24   Kandala, Hyndavi, MD  gabapentin  (NEURONTIN ) 300 MG capsule TAKE 1 CAPSULE(300 MG) BY MOUTH AT BEDTIME 06/12/24   Duanne Butler DASEN, MD  guaiFENesin -codeine  100-10 MG/5ML syrup Take 5 mLs by mouth every 12 (twelve) hours as needed for cough. 06/03/24   Davonna Siad, MD  hydrocortisone  (ANUSOL -HC) 2.5 % rectal cream Place rectally 4 (four) times daily. 06/11/24   Evonnie Lenis, MD  isosorbide  mononitrate (IMDUR ) 30 MG 24 hr tablet Take 0.5 tablets (15 mg total) by mouth daily. 03/28/24   Alvan Dorn FALCON, MD  lanthanum  (FOSRENOL ) 1000 MG chewable tablet Chew 2,000-3,000 mg by mouth See admin instructions. Take 3 tablets (3000 mg) by mouth with meals and take 2 tablets (2000 mg) with snacks 10/16/19   [provider]  lidocaine  (XYLOCAINE ) 2 % solution Use as directed 15 mLs in the mouth or throat every 4 (four) hours as needed for mouth pain (Mix 1:1 with Maalox and swish and spit). 05/22/24   Kandala, Hyndavi, MD  lidocaine -prilocaine  (EMLA ) cream Apply to affected area once 05/29/24   Kandala, Hyndavi,  MD  loperamide  (IMODIUM  A-D) 2 MG tablet Take 2 mg by mouth 4 (four) times daily as needed for diarrhea or loose stools.    [provider]  loperamide  (IMODIUM ) 2 MG capsule Take 2 tabs by mouth with first loose stool, then 1 tab with each additional loose stool as needed. Do not exceed 8 tabs in a 24-hour period 05/29/24   Kandala, Hyndavi, MD  Nebulizers (COMPRESSOR/NEBULIZER) MISC 1 Units by Does not apply route daily as needed. 05/15/24   Pearlean Manus, MD  OLANZapine  (ZYPREXA ) 5 MG tablet Take 1 tablet (5 mg total) by mouth at bedtime. 05/06/24   Kandala, Hyndavi, MD  ondansetron  (ZOFRAN ) 8 MG tablet Take 1 tablet (8 mg total) by mouth every 8 (eight) hours as needed for nausea or vomiting. Start on the third day after chemotherapy. 05/29/24   Davonna Siad, MD  ondansetron  (ZOFRAN -ODT) 4 MG disintegrating tablet Take 1 tablet by mouth every 8 (eight) hours as needed for vomiting or nausea. 04/23/24   [provider]  pantoprazole  (PROTONIX ) 40 MG tablet Take 1 tablet (40 mg total) by mouth 2 (two) times daily before a meal. 10/08/23   Rourk, Lamar HERO, MD  phenol (CHLORASEPTIC) 1.4 % LIQD Use as directed 1 spray in the mouth or throat as needed for throat irritation / pain. 05/26/24   Ricky Fines, MD  prochlorperazine  (COMPAZINE ) 10 MG tablet Take 1 tablet (10 mg total) by mouth every 6 (six) hours as needed for nausea or vomiting. 05/29/24   Davonna Siad, MD    Physical Exam: Vitals:   06/12/24 1600 06/12/24 1630 06/12/24 1700 06/12/24 1730  BP: 139/70 125/73 125/71 125/72  Pulse: 99 97 97 96  Resp: (!) 24 18 18 17   Temp:      TempSrc:      SpO2: 98% 95% 95% 94%    Constitutional: Appears profoundly weak, lethargic Vitals:   06/12/24 1600 06/12/24 1630 06/12/24 1700 06/12/24 1730  BP: 139/70 125/73 125/71 125/72  Pulse: 99 97 97 96  Resp: (!) 24 18 18 17   Temp:      TempSrc:      SpO2: 98% 95% 95% 94%   Eyes: PERRL, lids  and conjunctivae  normal ENMT: Mucous membranes are dry Neck: normal, supple, no masses, no thyromegaly Respiratory: clear to auscultation bilaterally, no wheezing, no crackles. Normal respiratory effort. No accessory muscle use.  Cardiovascular: Regular rate and rhythm, no murmurs / rubs / gallops. No extremity edema.  Extremities warm. Abdomen: Markedly tender, diffusely, abdomen soft, guarding present, no masses palpated. No hepatosplenomegaly.   Musculoskeletal: no clubbing / cyanosis. No joint deformity upper and lower extremities.  Skin: no rashes, lesions, ulcers. No induration Neurologic: No facial asymmetry, speech weak but fluent, poor grip strength - bilateral upper extremities, able to move lower extremities. Psychiatric: Normal judgment and insight. Alert and oriented x 3. Normal mood.   Labs on Admission: I have personally reviewed following labs and imaging studies  CBC: Recent Labs  Lab 06/08/24 1131 06/09/24 0412 06/10/24 0407 06/10/24 1936 06/11/24 0427 06/12/24 1605  WBC 0.3* 0.2* 0.4*  --  0.4* 0.6*  NEUTROABS 0.2* 0.0* 0.1*  --  0.1* 0.1*  HGB 8.0* 7.3* 6.8* 8.1* 7.9* 8.8*  HCT 26.1* 23.4* 21.9* 25.3* 24.9* 27.9*  MCV 115.5* 113.6* 111.2*  --  106.0* 106.9*  PLT 116* 94* 88*  --  60* 52*   Basic Metabolic Panel: Recent Labs  Lab 06/08/24 0205 06/09/24 0412 06/10/24 0407 06/11/24 0427 06/12/24 1605  NA 140 138 138 140 140  K 4.4 5.5* 4.1 3.8 3.4*  CL 96* 97* 95* 97* 97*  CO2 24 18* 25 24 26   GLUCOSE 135* 90 134* 93 85  BUN 75* 87* 47* 67* 56*  CREATININE 8.97* 10.40* 6.38* 8.16* 6.75*  CALCIUM  8.2* 8.5* 8.9 8.8* 9.2  MG 2.5*  --   --   --   --   PHOS 10.3*  --  7.5* 8.0*  --    GFR: Estimated Creatinine Clearance: 8.4 mL/min (A) (by C-G formula based on SCr of 6.75 mg/dL (H)). Liver Function Tests: Recent Labs  Lab 06/08/24 0205 06/09/24 0412 06/10/24 0407 06/11/24 0427 06/12/24 1605  AST 21 15  --   --  10*  ALT 19 14  --   --  13  ALKPHOS 83 63  --   --   85  BILITOT 0.3 0.3  --   --  0.9  PROT 5.6* 4.9*  --   --  5.8*  ALBUMIN 3.0* 2.6* 2.8* 2.6* 2.7*   Recent Labs  Lab 06/08/24 0205 06/12/24 1605  LIPASE 24 48   Coagulation Profile: Recent Labs  Lab 06/08/24 1131  INR 1.1   BNP (last 3 results) Recent Labs    05/23/24 0533  PROBNP 12,747.0*   Urine analysis:    Component Value Date/Time   COLORURINE YELLOW 03/30/2023 0758   APPEARANCEUR HAZY (A) 03/30/2023 0758   LABSPEC 1.009 03/30/2023 0758   PHURINE 9.0 (H) 03/30/2023 0758   GLUCOSEU 50 (A) 03/30/2023 0758   HGBUR NEGATIVE 03/30/2023 0758   BILIRUBINUR NEGATIVE 03/30/2023 0758   KETONESUR NEGATIVE 03/30/2023 0758   PROTEINUR 100 (A) 03/30/2023 0758   UROBILINOGEN 0.2 08/19/2011 1103   NITRITE NEGATIVE 03/30/2023 0758   LEUKOCYTESUR NEGATIVE 03/30/2023 0758    Radiological Exams on Admission: No results found.  EKG: Independently reviewed.  Sinus rhythm, rate 99, QTc 460.  No significant change from prior.  Assessment/Plan Principal Problem:   Generalized weakness Active Problems:   Essential hypertension, benign   Diarrhea   Anemia in chronic kidney disease   Pancytopenia (HCC)   Breast cancer (HCC)   ESRD (end stage  renal disease) on dialysis [facility])   Assessment and Plan:  Diarrhea, abdominal pain-still with persistent abdominal pain and diarrhea.  Recent 10/26- CT [facility]  W contrast-no acute findings, findings consistent with diffuse metastasis.  Hypotensive during recent hospitalization, blood pressure 120s to 130s today.  She appears very weak. - 500 bolus given, D5 LR 40cc/hr x 5hrs - Cautious fluids she does not make urine - Stool C. difficile, GI pathogen panel - Clear liquid diet - Abdominal pain similar to to recent hospitalization, pending clinical course if persistent or worsening may need repeat abdominal imaging - IV Dilaudid  0.5 every 4 hourly as needed - Per oncology notes 10/27-use Imodium  4 mg as needed, if not controlled -Lomotil  as  needed, can consider starting prednisone  if not controlled with both Imodium  and Lomotil  - Will start Lomotil  as needed  Pancytopenia, severe neutropenia- WBC 0.6, hemoglobin 8.8, platelets 52. Received a dose of pegfilgrastim  on 06/06/24 thru the oncology center.  Likely secondary to chemotherapy. - May need to reach out to oncology if with repeat dose would be beneficial  Failure to thrive, generalized weakness-poor prognosis, with diffuse metastatic disease.  ESRD-schedule Monday Wednesday Friday.  Blood pressure has not tolerated outpatient dialysis.   - Nephrology consulted, will see in consult  Metastatic breast cancer-Mets to lungs and liver, follows with Dr. Davonna.  Onset of diarrhea after last chemotherapy fusion 10/23.  Hypertension- stable. - Hold Imdur  for now  Chronic anemia-hemoglobin 8.8.  Stable.  Chronic respiratory failure on 2 L stable.   DVT prophylaxis: SCDs Code Status: DNR family patient and spouse at bedside, and the bedside DNR form at bedside. Family Communication: Spouse at bedside. Disposition Plan: ~ 2 days Consults called: Nephrology, palliative care Admission status:  obs tele    Author: Tully FORBES Carwin, MD 06/12/2024 8:17 PM  For on call review [email or web address].

## 2024-06-12 NOTE — Telephone Encounter (Signed)
 I received a phone call from this patient's husband Debrah Granderson.  I was seeing the patient with the palliative medicine team during her most recent admission for sepsis, chemotherapy-induced neutropenia, septic shock, ESRD on HD, stage IV metastatic cancer.  She discharged home with home hospice services.  However, husband states that when she got home and hospice came to the intake visit the patient changed her mind and decided she want to continue dialysis and ongoing medical treatment including possibly cancer treatments.  Patient's husband requesting how to proceed and I encouraged him to return to the hospital given the severity of her illness at discharge with plans for hospice today are now resending.  I advised him to come to the ED and explained to triage and the ED provider the situation.  Palliative medicine is available for consult if patient is readmitted for ongoing full scope of care.  Thank you for allowing us  to participate in the care of Megan JONETTA Bunker Camellia Marvis, DNP, AGNP-C Adult & Gerontological Nurse Practitioner Palliative Medicine

## 2024-06-12 NOTE — Telephone Encounter (Signed)
 Requested Prescriptions  Pending Prescriptions Disp Refills   gabapentin  (NEURONTIN ) 300 MG capsule [Pharmacy Med Name: GABAPENTIN  300MG  CAPSULES] 90 capsule 0    Sig: TAKE 1 CAPSULE(300 MG) BY MOUTH AT BEDTIME     Neurology: Anticonvulsants - gabapentin  Failed - 06/12/2024  3:13 PM      Failed - Cr in normal range and within 360 days    Creat  Date Value Ref Range Status  01/10/2024 9.08 (H) 0.50 - 1.05 mg/dL Final    Comment:    Verified by repeat analysis. .    Creatinine, Ser  Date Value Ref Range Status  06/11/2024 8.16 (H) 0.44 - 1.00 mg/dL Final         Passed - Completed PHQ-2 or PHQ-9 in the last 360 days      Passed - Valid encounter within last 12 months    Recent Outpatient Visits           3 weeks ago Carcinoma of breast metastatic to lung, unspecified laterality [facility])   [place] [facility] Pickard, Butler DASEN, MD   4 months ago Lymphadenopathy   [place] [facility] Duanne Butler DASEN, MD   7 months ago Atypical chest pain   [place] [facility] Duanne Butler DASEN, MD   8 months ago Gastroesophageal reflux disease without esophagitis   [place] [facility] Pickard, Butler DASEN, MD   1 year ago Elevated blood sugar    [facility] Pickard, Butler DASEN, MD

## 2024-06-12 NOTE — ED Triage Notes (Addendum)
 Pt dc from ICU yesterday. Pt c/o all over abd pain and diarrhea today. A/o. Abd distended but soft. Hx of dialysis M/W/F.  Cbg 113. 02 2l [place] at home. Pt states was having these sx's while admitted as well and is no worse. Pt states she doesn't know whey she is here. Family called ems.  Husband at bedside states all symptoms have been present and no knew symptoms. States she needs dialysis and needs it tomorrow and since she is immobile she can't go to [facility].

## 2024-06-13 DIAGNOSIS — R197 Diarrhea, unspecified: Secondary | ICD-10-CM | POA: Diagnosis not present

## 2024-06-13 DIAGNOSIS — R531 Weakness: Secondary | ICD-10-CM | POA: Diagnosis not present

## 2024-06-13 LAB — CBC
HCT: 26.8 % — ABNORMAL LOW (ref 36.0–46.0)
HCT: 25.7 % — ABNORMAL LOW (ref 36.0–46.0)
Hemoglobin: 8.5 g/dL — ABNORMAL LOW (ref 12.0–15.0)
Hemoglobin: 8 g/dL — ABNORMAL LOW (ref 12.0–15.0)
MCH: 33.9 pg (ref 26.0–34.0)
MCH: 33.3 pg (ref 26.0–34.0)
MCHC: 31.7 g/dL (ref 30.0–36.0)
MCHC: 31.1 g/dL (ref 30.0–36.0)
MCV: 106.8 fL — ABNORMAL HIGH (ref 80.0–100.0)
MCV: 107.1 fL — ABNORMAL HIGH (ref 80.0–100.0)
Platelets: 47 K/uL — ABNORMAL LOW (ref 150–400)
Platelets: 44 K/uL — ABNORMAL LOW (ref 150–400)
RBC: 2.51 MIL/uL — ABNORMAL LOW (ref 3.87–5.11)
RBC: 2.4 MIL/uL — ABNORMAL LOW (ref 3.87–5.11)
RDW: 20.4 % — ABNORMAL HIGH (ref 11.5–15.5)
RDW: 20.4 % — ABNORMAL HIGH (ref 11.5–15.5)
WBC: 0.8 K/uL — CL (ref 4.0–10.5)
WBC: 0.9 K/uL — CL (ref 4.0–10.5)
nRBC: 0 % (ref 0.0–0.2)
nRBC: 0 % (ref 0.0–0.2)

## 2024-06-13 LAB — CULTURE, BLOOD (ROUTINE X 2)
Culture: NO GROWTH
Culture: NO GROWTH

## 2024-06-13 LAB — BASIC METABOLIC PANEL WITH GFR
Anion gap: 17 — ABNORMAL HIGH (ref 5–15)
BUN: 66 mg/dL — ABNORMAL HIGH (ref 8–23)
CO2: 26 mmol/L (ref 22–32)
Calcium: 8.9 mg/dL (ref 8.9–10.3)
Chloride: 97 mmol/L — ABNORMAL LOW (ref 98–111)
Creatinine, Ser: 7.86 mg/dL — ABNORMAL HIGH (ref 0.44–1.00)
GFR, Estimated: 5 mL/min — ABNORMAL LOW (ref >60)
Glucose, Bld: 131 mg/dL — ABNORMAL HIGH (ref 70–99)
Potassium: 3.2 mmol/L — ABNORMAL LOW (ref 3.5–5.1)
Sodium: 140 mmol/L (ref 135–145)

## 2024-06-13 LAB — C DIFFICILE QUICK SCREEN W PCR REFLEX
C Diff antigen: NEGATIVE
C Diff interpretation: NOT DETECTED
C Diff toxin: NEGATIVE

## 2024-06-13 LAB — HEPATITIS B SURFACE ANTIGEN: Hepatitis B Surface Ag: NONREACTIVE

## 2024-06-13 MED ORDER — CHLORHEXIDINE GLUCONATE CLOTH 2 % EX PADS
6.0000 | MEDICATED_PAD | Freq: Every day | CUTANEOUS | Status: DC
  Administered 2024-06-14: 6 via TOPICAL

## 2024-06-13 MED ORDER — FLORANEX PO PACK
1.0000 g | PACK | Freq: Three times a day (TID) | ORAL | Status: DC
  Administered 2024-06-14 (×2): 1 g via ORAL
  Filled 2024-06-13 (×2): qty 1

## 2024-06-13 MED ORDER — DEXTROSE IN LACTATED RINGERS 5 % IV SOLN: INTRAVENOUS | Status: AC

## 2024-06-13 MED ORDER — GUAIFENESIN-DM 100-10 MG/5ML PO SYRP
5.0000 mL | ORAL_SOLUTION | ORAL | Status: DC | PRN
  Administered 2024-06-13 – 2024-06-14 (×2): 5 mL via ORAL
  Filled 2024-06-13 (×2): qty 5

## 2024-06-13 MED ORDER — POTASSIUM CHLORIDE CRYS ER 20 MEQ PO TBCR
20.0000 meq | EXTENDED_RELEASE_TABLET | Freq: Once | ORAL | Status: AC
  Administered 2024-06-13: 20 meq via ORAL
  Filled 2024-06-13: qty 1

## 2024-06-13 NOTE — Progress Notes (Signed)
 Pts husband complained pt has not received any of her scheduled medications since she was re-admitted to the hospital, and he is unhappy with her care. Pts husband stated he could go home and get her meds so she could take them because she is in pain and needs her medications. Contacted MD Manfred and charge Nurse Metta Sar regarding the situation. No new orders have been released by doctor. Metta Sar, RN Charge Nurse indicated she would follow up with MD Adefeso pertaining to pts meds.

## 2024-06-13 NOTE — Progress Notes (Addendum)
 Noted that pt wishes to resume HD. Contacted out-pt HD clinic, [facility] to inform of this.  Clinic stated pt may resume at clinic with her chair that she initially had. She will resume MWF schedule, 0530am chair time. SW and nephrology informed at this time.   Will continue to assist.   Lavanda Fredrickson Dialysis Navigator [phone]

## 2024-06-13 NOTE — Progress Notes (Signed)
 Mobility Specialist Progress Note:    06/13/24 1125  Mobility  Activity Pivoted/transferred from chair to bed  Level of Assistance Minimal assist, patient does 75% or more  Assistive Device None  Distance Ambulated (ft) 3 ft  Range of Motion/Exercises Active;All extremities  Activity Response Tolerated well  Mobility Referral Yes  Mobility visit 1 Mobility  Mobility Specialist Start Time (ACUTE ONLY) 1125  Mobility Specialist Stop Time (ACUTE ONLY) 1140  Mobility Specialist Time Calculation (min) (ACUTE ONLY) 15 min   Pt received in chair, requesting assistance to bed. Required MinA to stand and transfer with no AD. Tolerated well,asx throughout. All needs met.  Keyandre Pileggi Mobility Specialist Please contact via Special Educational Needs Teacher or  Rehab office at [phone]

## 2024-06-13 NOTE — Hospital Course (Signed)
 Megan [name] is a 63 y.o. female with medical history significant for metastatic breast cancer, ESRD, diabetes mellitus, HTN, GAVE. Patient presented to the ED with complaints of persistent diarrhea and abdominal pain.   Patient was just discharged home yesterday to begin home hospice today.  Hospitalized 10/26 to 10/29, presented with diarrhea, for severe neutropenia, status post recent chemotherapy, neutropenic fever, and severe sepsis with septic shock. She required norepinephrine drip for about 24 hours.  Treated with broad-spectrum antibiotics.  Blood cultures negative.  CTAP WC -no acute findings, or metastatic findings.   Patient and husband changed her mind today-they no longer want hospice care, rather wants to continue full scope of treatment and explore chemotherapy options.  They tell me they were unaware that patient will be stopping dialysis.  And this was the deal break for them starting hospice care. Since getting home, she has not been able to take anything orally, she is unable to ambulate, she is very weak, with persistent diarrhea and abdominal pain, she has had 3 episodes of diarrhea today.  Blood pressure has not been able to tolerate outpatient dialysis.

## 2024-06-13 NOTE — Progress Notes (Signed)
 Date and time results received: 06/13/24  0820 (use smartphrase .now to insert current time)  Test: WBC Critical Value: 0.9  Name of Provider Notified: Shahmehdi  Orders Received? Or Actions Taken?:

## 2024-06-13 NOTE — TOC Initial Note (Signed)
 Transition of Care [facility]) - Initial/Assessment Note    Patient Details  Name: Megan [name] MRN: [id number] Date of Birth: Jun 18, 1961  Transition of Care [facility]) CM/SW Contact:    Lucie Lunger, LCSWA Phone Number: 06/13/2024, 1:25 PM  Clinical Narrative:                 CSW notes per chart review that pt was recently D/C home with hospice services. Pt and family now have revoked hospice and plan for pt to get back on outpatient HD schedule. CSW spoke to HD navigator who confirms pts chair time is back in place. Pts spouse states they have a walker and pt is on home O2 at baseline. CSW spoke with them about HH being arranged, they asked that CSW follow up closer to D/C. TOC to follow.   Expected Discharge Plan: Home w Home Health Services Barriers to Discharge: Continued Medical Work up   Patient Goals and CMS Choice Patient states their goals for this hospitalization and ongoing recovery are:: return home CMS Medicare.gov Compare Post Acute Care list provided to:: Patient Represenative (must comment) Choice offered to / list presented to : Spouse, Patient      Expected Discharge Plan and Services In-house Referral: Clinical Social Work Discharge Planning Services: CM Consult Post Acute Care Choice: Home Health Living arrangements for the past 2 months: Single Family Home                                      Prior Living Arrangements/Services Living arrangements for the past 2 months: Single Family Home Lives with:: Spouse Patient language and need for interpreter reviewed:: Yes Do you feel safe going back to the place where you live?: Yes      Need for Family Participation in Patient Care: Yes (Comment) Care giver support system in place?: Yes (comment) Current home services: DME Criminal Activity/Legal Involvement Pertinent to Current Situation/Hospitalization: No - Comment as needed  Activities of Daily Living   ADL Screening (condition at time of  admission) Independently performs ADLs?: No Does the patient have a NEW difficulty with bathing/dressing/toileting/self-feeding that is expected to last >3 days?: Yes (Initiates electronic notice to provider for possible OT consult) Does the patient have a NEW difficulty with getting in/out of bed, walking, or climbing stairs that is expected to last >3 days?: Yes (Initiates electronic notice to provider for possible PT consult) Does the patient have a NEW difficulty with communication that is expected to last >3 days?: No Is the patient deaf or have difficulty hearing?: No Does the patient have difficulty seeing, even when wearing glasses/contacts?: No Does the patient have difficulty concentrating, remembering, or making decisions?: No  Permission Sought/Granted                  Emotional Assessment Appearance:: Appears stated age Attitude/Demeanor/Rapport: Engaged Affect (typically observed): Accepting Orientation: : Oriented to Self, Oriented to Place, Oriented to  Time, Oriented to Situation Alcohol / Substance Use: Not Applicable Psych Involvement: No (comment)  Admission diagnosis:  Generalized weakness [R53.1] Patient Active Problem List   Diagnosis Date Noted   Generalized weakness 06/12/2024   Diarrhea with dehydration 06/08/2024   Hypotension 06/08/2024   Cancer associated pain 05/26/2024   Postobstructive pneumonia 05/23/2024   Volume overload 05/12/2024   Nausea with vomiting 05/06/2024   Peripheral neuropathy 04/08/2024   Right-sided chest wall pain 04/08/2024  Osteopenia 04/08/2024   Genetic testing 03/26/2024   Mucosal abnormality of stomach 12/31/2023   GAVE (gastric antral vascular ectasia) 12/31/2023   Gastritis 10/11/2023   Recurrent breast cancer, right (HCC) 01/18/2023   Chest pressure 11/09/2022   History of cholecystectomy 11/09/2022   Dysuria 04/05/2022   Prolapsed internal hemorrhoids, grade 2 12/07/2021   LUQ pain 05/11/2021   Fatigue  05/03/2021   C. difficile colitis 02/15/2021   Severe sepsis with septic shock (HCC) 02/14/2021   Dizziness    Breast mass, right    History of right breast cancer    Lobar pneumonia, unspecified organism 07/03/2020   Pleural effusion, right    S/P left mastectomy 06/15/2020   Allergy, unspecified, initial encounter 03/03/2020   Anaphylactic shock, unspecified, initial encounter 03/03/2020   Breast cancer (HCC) 02/18/2020   History of modified radical mastectomy of right breast 02/18/2020   S/P mastectomy, right 02/18/2020   ESRD (end stage renal disease) on dialysis [facility])    Prolapsed internal hemorrhoids, grade 3 07/31/2019   Fluid overload, unspecified 05/24/2019   Anaphylactic reaction due to adverse effect of correct drug or medicament properly administered, initial encounter 04/30/2019   Unspecified protein-calorie malnutrition 03/03/2019   Hypertensive heart disease with heart failure (HCC) 03/01/2019   Atherosclerotic heart disease of native coronary artery without angina pectoris 03/01/2019   Chest pain 02/28/2019   Chest pain with high risk for cardiac etiology 02/28/2019   Elevated troponin 02/03/2019   ESRD (end stage renal disease) (HCC) 02/03/2019   Acute diastolic CHF (congestive heart failure) (HCC) 02/03/2019   Hypokalemia 02/03/2019   Acute respiratory failure with hypoxia (HCC) 02/03/2019   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)    Hypocalcemia 09/03/2018   Hypothyroidism, unspecified 09/21/2017   Anemia in chronic kidney disease 04/12/2017   Iron  deficiency anemia, unspecified 04/12/2017   Abnormal results of kidney function studies 04/11/2017   Acidosis 04/11/2017   Pancytopenia (HCC) 04/11/2017   Coagulation defect, unspecified 04/11/2017   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, with stage 5 chronic kidney disease, or end stage renal disease (HCC) 04/11/2017   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 04/11/2017   Other disorders of phosphorus metabolism 04/11/2017   Pain, unspecified 04/11/2017   Proteinuria, unspecified 04/11/2017   Pruritus, unspecified 04/11/2017   Unspecified kidney failure 04/11/2017   Encounter for prophylactic removal of breast 04/04/2017   Rectal bleeding 06/08/2016   Vaginal discharge 12/30/2015   Dyspepsia 11/26/2013   GERD (gastroesophageal reflux disease)    Type 2 diabetes mellitus (HCC)    CKD (chronic kidney disease) stage 3, GFR 30-59 ml/min (HCC)    Diarrhea 08/01/2011   External hemorrhoid 08/01/2011   Hematochezia 07/13/2011   Dyspnea 04/21/2010   Atypical chest pain 04/21/2010   Mixed hyperlipidemia 03/11/2008   OBESITY 03/11/2008   Essential hypertension, benign 03/11/2008   PCP:  Duanne Butler DASEN, MD Pharmacy:   [facility] [phone] - [place], [place] - [address] AT [facility] OF [address] [place] [address] Phone: [phone] Fax: [phone]  [id number] - [place], [place] - [address] AT [facility] OF [address] & [address]  [place] [address] Phone: [phone] Fax: [phone]     Social Drivers of Health (SDOH) Social History: SDOH Screenings   Food Insecurity: No Food Insecurity (06/12/2024)  Housing: Low Risk  (06/12/2024)  Transportation Needs:  No Transportation Needs (06/12/2024)  Utilities: Not At Risk (06/12/2024)  Alcohol Screen: Low Risk  (09/20/2023)  Depression (PHQ2-9): Low Risk  (06/05/2024)  Financial Resource Strain: Low Risk  (09/20/2023)  Physical Activity: Inactive (09/20/2023)  Social Connections: Unknown (06/08/2024)  Stress: No Stress Concern Present (09/20/2023)  Tobacco Use: Low Risk  (06/12/2024)   SDOH Interventions:     Readmission Risk Interventions    06/09/2024    4:05 PM 06/08/2024    8:34 AM 05/24/2024    2:23 PM  Readmission Risk Prevention Plan  Transportation Screening  Complete Complete Complete  Medication Review Oceanographer) Complete Complete Complete  PCP or Specialist appointment within 3-5 days of discharge  Complete Not Complete  HRI or Home Care Consult Complete Complete Complete  SW Recovery Care/Counseling Consult Complete Complete Complete  Palliative Care Screening Not Applicable Not Applicable Not Applicable  Skilled Nursing Facility Not Applicable Not Applicable Not Applicable

## 2024-06-13 NOTE — Evaluation (Signed)
 Physical Therapy Evaluation Patient Details Name: Megan [name] MRN: [id number] DOB: Aug 23, 1960 Today's Date: 06/13/2024  History of Present Illness  Megan [name] is a 63 y.o. female with medical history significant for metastatic breast cancer, ESRD, diabetes mellitus, HTN, GAVE.  Patient presented to the ED with complaints of persistent diarrhea and abdominal pain.     Patient was just discharged home yesterday to begin home hospice today.   Hospitalized 10/26 to 10/29, presented with diarrhea, for severe neutropenia, status post recent chemotherapy, neutropenic fever, and severe sepsis with septic shock. She required norepinephrine drip for about 24 hours.  Treated with broad-spectrum antibiotics.  Blood cultures negative.  CTAP WC -no acute findings, or metastatic findings.     Patient and husband changed her mind today-they no longer want hospice care, rather wants to continue full scope of treatment and explore chemotherapy options.  They tell me they were unaware that patient will be stopping dialysis.  And this was the deal break for them starting hospice care.  Since getting home, she has not been able to take anything orally, she is unable to ambulate, she is very weak, with persistent diarrhea and abdominal pain, she has had 3 episodes of diarrhea today.  Blood pressure has not been able to tolerate outpatient dialysis.   Clinical Impression  Patient demonstrates slow labored movement for rolling to side and sitting up at bedside due weakness and stomach discomfort/pain, limited to a few side steps at bedside due c/o fatigue/generalized weakness and tolerated sitting up in chair with family present after therapy. Patient will benefit from continued skilled physical therapy in hospital and recommended venue below to increase strength, balance, endurance for safe ADLs and gait.          If plan is discharge home, recommend the following: A little help with walking and/or  transfers;A little help with bathing/dressing/bathroom;Help with stairs or ramp for entrance;Assistance with cooking/housework   Can travel by private vehicle        Equipment Recommendations Rolling walker (2 wheels)  Recommendations for Other Services       Functional Status Assessment Patient has had a recent decline in their functional status and demonstrates the ability to make significant improvements in function in a reasonable and predictable amount of time.     Precautions / Restrictions Precautions Precautions: Fall Recall of Precautions/Restrictions: Intact Restrictions Weight Bearing Restrictions Per Provider Order: No      Mobility  Bed Mobility Overal bed mobility: Needs Assistance Bed Mobility: Rolling, Sidelying to Sit Rolling: Min assist Sidelying to sit: Min assist, Mod assist       General bed mobility comments: increased time, labored movement    Transfers Overall transfer level: Needs assistance Equipment used: Rolling walker (2 wheels) Transfers: Sit to/from Stand, Bed to chair/wheelchair/BSC Sit to Stand: Min assist   Step pivot transfers: Min assist       General transfer comment: unsteady labored movement, required use of RW for safety    Ambulation/Gait Ambulation/Gait assistance: Min assist, Mod assist Gait Distance (Feet): 5 Feet Assistive device: Rolling walker (2 wheels) Gait Pattern/deviations: Decreased step length - left, Decreased stance time - right, Decreased stride length Gait velocity: decreased     General Gait Details: limited to a few steps at bedside before having to sit due to c/o fatigue, generalized weakness  Stairs            Wheelchair Mobility     Tilt Bed    Modified Rankin (Stroke  Patients Only)       Balance Overall balance assessment: Needs assistance Sitting-balance support: Feet supported, No upper extremity supported Sitting balance-Leahy Scale: Fair Sitting balance - Comments:  fair/good seated at EOB   Standing balance support: Reliant on assistive device for balance, During functional activity, Bilateral upper extremity supported Standing balance-Leahy Scale: Fair Standing balance comment: using RW                             Pertinent Vitals/Pain Pain Assessment Pain Assessment: 0-10 Pain Score: 8  Pain Location: stomach Pain Descriptors / Indicators: Discomfort, Sore Pain Intervention(s): Limited activity within patient's tolerance, Monitored during session, Repositioned    Home Living Family/patient expects to be discharged to:: Private residence Living Arrangements: Spouse/significant other Available Help at Discharge: Family;Available 24 hours/day Type of Home: House Home Access: Stairs to enter Entrance Stairs-Rails: None Entrance Stairs-Number of Steps: 1   Home Layout: One level Home Equipment: None      Prior Function Prior Level of Function : Needs assist       Physical Assist : Mobility (physical);ADLs (physical) Mobility (physical): Bed mobility;Transfers;Gait;Stairs   Mobility Comments: Supervised household ambulation without AD ADLs Comments: Assisted by family     Extremity/Trunk Assessment   Upper Extremity Assessment Upper Extremity Assessment: Defer to OT evaluation    Lower Extremity Assessment Lower Extremity Assessment: Generalized weakness    Cervical / Trunk Assessment Cervical / Trunk Assessment: Normal  Communication   Communication Communication: No apparent difficulties    Cognition Arousal: Alert Behavior During Therapy: WFL for tasks assessed/performed   PT - Cognitive impairments: No apparent impairments                         Following commands: Intact       Cueing Cueing Techniques: Verbal cues     General Comments      Exercises     Assessment/Plan    PT Assessment Patient needs continued PT services  PT Problem List Decreased strength;Decreased activity  tolerance;Decreased balance;Decreased mobility;Pain       PT Treatment Interventions DME instruction;Gait training;Stair training;Functional mobility training;Therapeutic activities;Therapeutic exercise;Balance training;Patient/family education    PT Goals (Current goals can be found in the Care Plan section)  Acute Rehab PT Goals Patient Stated Goal: Return home with family to assist PT Goal Formulation: With patient/family Time For Goal Achievement: 06/20/24 Potential to Achieve Goals: Good    Frequency Min 3X/week     Co-evaluation               AM-PAC PT 6 Clicks Mobility  Outcome Measure Help needed turning from your back to your side while in a flat bed without using bedrails?: A Little Help needed moving from lying on your back to sitting on the side of a flat bed without using bedrails?: A Lot Help needed moving to and from a bed to a chair (including a wheelchair)?: A Little Help needed standing up from a chair using your arms (e.g., wheelchair or bedside chair)?: A Little Help needed to walk in hospital room?: A Lot Help needed climbing 3-5 steps with a railing? : A Lot 6 Click Score: 15    End of Session Equipment Utilized During Treatment: Oxygen  Activity Tolerance: Patient tolerated treatment well;Patient limited by fatigue Patient left: in chair;with call bell/phone within reach;with family/visitor present Nurse Communication: Mobility status PT Visit Diagnosis: Unsteadiness on feet (R26.81);Other abnormalities of  gait and mobility (R26.89);Muscle weakness (generalized) (M62.81)    Time: 9055-8985 PT Time Calculation (min) (ACUTE ONLY): 30 min   Charges:   PT Evaluation $PT Eval Moderate Complexity: 1 Mod PT Treatments $Therapeutic Activity: 23-37 mins PT General Charges $$ ACUTE PT VISIT: 1 Visit         12:33 PM, 06/13/24 Lynwood Music, MPT Physical Therapist with [facility] [phone] office [phone] mobile phone

## 2024-06-13 NOTE — Progress Notes (Signed)
 Patient's diet advanced as tolerated by MD. Patient's husband aware and very knowledgeable about the renal diet and restrictions. He will bring food that patient may eat.

## 2024-06-13 NOTE — Progress Notes (Signed)
 PROGRESS NOTE    Patient: Megan [name]                            PCP: Duanne Butler DASEN, MD                    DOB: Aug 20, 1960            DOA: 06/12/2024 FMW:[id number]             DOS: 06/13/2024, 11:13 AM   LOS: 0 days   Date of Service: The patient was seen and examined on 06/13/2024  Subjective:   The patient was seen and examined this morning. Somnolent, tachycardic, satting 95% on 2 L of oxygen , blood pressure stable at 136/80 Follows command,  Brief Narrative:   DENISSE WHITENACK is a 63 y.o. female with medical history significant for metastatic breast cancer, ESRD, diabetes mellitus, HTN, GAVE. Patient presented to the ED with complaints of persistent diarrhea and abdominal pain.   Patient was just discharged home yesterday to begin home hospice today.  Hospitalized 10/26 to 10/29, presented with diarrhea, for severe neutropenia, status post recent chemotherapy, neutropenic fever, and severe sepsis with septic shock. She required norepinephrine drip for about 24 hours.  Treated with broad-spectrum antibiotics.  Blood cultures negative.  CTAP WC -no acute findings, or metastatic findings.   Patient and husband changed her mind today-they no longer want hospice care, rather wants to continue full scope of treatment and explore chemotherapy options.  They tell me they were unaware that patient will be stopping dialysis.  And this was the deal break for them starting hospice care. Since getting home, she has not been able to take anything orally, she is unable to ambulate, she is very weak, with persistent diarrhea and abdominal pain, she has had 3 episodes of diarrhea today.  Blood pressure has not been able to tolerate outpatient dialysis.     Assessment & Plan:   Principal Problem:   Generalized weakness Active Problems:   Essential hypertension, benign   Diarrhea   Anemia in chronic kidney disease   Pancytopenia (HCC)   Breast cancer (HCC)   ESRD (end  stage renal disease) on dialysis (HCC)    Diarrhea, abdominal pain - Mildly somnolent likely due to pain medication, improved pain with current regimen -Per husband improving diarrhea episodes, -still with persistent abdominal pain and diarrhea.   - Recent 10/26- CT AP  W contrast-no acute findings, findings consistent with diffuse metastasis.   Hypotensive during recent hospitalization -blood pressure has improved, stabilized - 500 bolus given, D5 LR 40cc/hr x 5hrs - Cautious fluids she does not make urine - Stool C. difficile, GI pathogen panel - Clear liquid diet  - IV Dilaudid  0.5 every 4 hourly as needed  - Per oncology notes 10/27-use Imodium  4 mg as needed, if not controlled -Lomotil  as needed, can consider starting prednisone  if not controlled with both Imodium  and Lomotil  - Will start Lomotil  as needed   Pancytopenia, severe neutropenia- -  WBC 0.6, hemoglobin 8.8, platelets 52. Received a dose of pegfilgrastim  on 06/06/24 thru the oncology center.   Likely secondary to chemotherapy. - May need to reach out to oncology if with repeat dose would be beneficial   Failure to thrive, generalized weakness-poor prognosis, with diffuse metastatic disease.   ESRD -schedule Monday Wednesday Friday.  Blood pressure has not tolerated outpatient dialysis.   - Nephrology consulted --continue with hemodialysis  as scheduled   Metastatic breast cancer -Mets to lungs and liver, follows with Dr. Davonna.   Onset of diarrhea after last chemotherapy fusion 10/23.   Hypertension- stable. - Hold Imdur  for now   Chronic anemia-hemoglobin 8.8.  Stable.   Chronic respiratory failure on 2 L stable.... No signs of distress  Ethics - patient remains DNR/DNI, but has revoked hospice care  ----------------------------------------------------------------------------------------------------------------------------------------------- Nutritional status:  The patient's BMI is: Body mass index is  30.52 kg/m. ------------------------------------------------------------------------------------------------------------------------------------------------  DVT prophylaxis:  SCDs Start: 06/12/24 2014   Code Status:   Code Status: Limited: Do not attempt resuscitation (DNR) -DNR-LIMITED -Do Not Intubate/DNI   Family Communication: Husband present at bedside, updated -Advance care planning has been discussed.   Admission status:   Status is: Observation The patient remains OBS appropriate and will d/c before 2 midnights.   Disposition: From  - home             Planning for discharge in 1-2 days   Procedures:   No admission procedures for hospital encounter.   Antimicrobials:  Anti-infectives (From admission, onward)    None        Medication:   Chlorhexidine  Gluconate Cloth  6 each Topical Q0600   cinacalcet   90 mg Oral Q breakfast   lactobacillus  1 g Oral TID WC    acetaminophen  **OR** acetaminophen , albuterol , diphenoxylate -atropine , guaiFENesin -dextromethorphan, HYDROmorphone  (DILAUDID ) injection, ondansetron  **OR** ondansetron  (ZOFRAN ) IV   Objective:   Vitals:   06/12/24 1903 06/12/24 1930 06/12/24 2008 06/13/24 0336  BP:  139/80 (!) 144/83 136/80  Pulse:  99 97 (!) 103  Resp:  (!) 22 20 18   Temp: (!) 97.5 F (36.4 C)  98 F (36.7 C) 97.9 F (36.6 C)  TempSrc: Oral     SpO2:  93% 95% 95%  Weight:   75.7 kg     Intake/Output Summary (Last 24 hours) at 06/13/2024 1113 Last data filed at 06/13/2024 0841 Gross per 24 hour  Intake 960 ml  Output --  Net 960 ml   Filed Weights   06/12/24 2008  Weight: 75.7 kg     Physical examination:   General:  AAO x 3,  cooperative, no distress;   HEENT:  Normocephalic, PERRL, otherwise with in Normal limits   Neuro:  CNII-XII intact. , normal motor and sensation, reflexes intact   Lungs:   Clear to auscultation BL, Respirations unlabored,  No wheezes / crackles  Cardio:    S1/S2, RRR, No murmure, No  Rubs or Gallops   Abdomen:  Soft, non-tender, bowel sounds active all four quadrants, no guarding or peritoneal signs.  Muscular  skeletal:  Limited exam -global generalized weaknesses - in bed, able to move all 4 extremities,   2+ pulses,  symmetric, +1 pitting edema Left arm lymphedema  Skin:  Dry, warm to touch, negative for any Rashes, Mastectomy scar or, port cath on the right side of chest  Wounds: Please see nursing documentation       ------------------------------------------------------------------------------------------------------------------------------------------    LABs:     Latest Ref Rng & Units 06/13/2024    7:49 AM 06/13/2024    4:13 AM 06/12/2024    4:05 PM  CBC  WBC 4.0 - 10.5 K/uL 0.9  0.8  0.6   Hemoglobin 12.0 - 15.0 g/dL 8.0  8.5  8.8   Hematocrit 36.0 - 46.0 % 25.7  26.8  27.9   Platelets 150 - 400 K/uL 44  47  52  Latest Ref Rng & Units 06/13/2024    4:13 AM 06/12/2024    4:05 PM 06/11/2024    4:27 AM  CMP  Glucose 70 - 99 mg/dL 868  85  93   BUN 8 - 23 mg/dL 66  56  67   Creatinine 0.44 - 1.00 mg/dL 2.13  3.24  1.83   Sodium 135 - 145 mmol/L 140  140  140   Potassium 3.5 - 5.1 mmol/L 3.2  3.4  3.8   Chloride 98 - 111 mmol/L 97  97  97   CO2 22 - 32 mmol/L 26  26  24    Calcium  8.9 - 10.3 mg/dL 8.9  9.2  8.8   Total Protein 6.5 - 8.1 g/dL  5.8    Total Bilirubin 0.0 - 1.2 mg/dL  0.9    Alkaline Phos 38 - 126 U/L  85    AST 15 - 41 U/L  10    ALT 0 - 44 U/L  13         Micro Results Recent Results (from the past 240 hours)  Culture, blood (Routine X 2) w Reflex to ID Panel     Status: None   Collection Time: 06/08/24 10:25 AM   Specimen: BLOOD  Result Value Ref Range Status   Specimen Description BLOOD BLOOD RIGHT ARM  Final   Special Requests   Final    AEROBIC BOTTLE ONLY Blood Culture results may not be optimal due to an inadequate volume of blood received in culture bottles   Culture   Final    NO GROWTH 5  DAYS Performed at [facility], [address] [place], [place] [address]    Report Status 06/13/2024 FINAL  Final  Culture, blood (Routine X 2) w Reflex to ID Panel     Status: None   Collection Time: 06/08/24 10:25 AM   Specimen: BLOOD  Result Value Ref Range Status   Specimen Description BLOOD BLOOD RIGHT HAND  Final   Special Requests   Final    AEROBIC BOTTLE ONLY Blood Culture results may not be optimal due to an inadequate volume of blood received in culture bottles   Culture   Final    NO GROWTH 5 DAYS Performed at [facility], [address] [place], [place] [address]    Report Status 06/13/2024 FINAL  Final  MRSA Next Gen by PCR, Nasal     Status: None   Collection Time: 06/08/24 10:28 PM   Specimen: Nasal Mucosa; Nasal Swab  Result Value Ref Range Status   MRSA by PCR Next Gen NOT DETECTED NOT DETECTED Final    Comment: (NOTE) The GeneXpert MRSA Assay (FDA approved for NASAL specimens only), is one component of a comprehensive MRSA colonization surveillance program. It is not intended to diagnose MRSA infection nor to guide or monitor treatment for MRSA infections. Test performance is not FDA approved in patients less than 34 years old. Performed at [facility], [address], [place], [place] [address]   C Difficile Quick Screen w PCR reflex     Status: None   Collection Time: 06/13/24  3:38 AM   Specimen: STOOL  Result Value Ref Range Status   C Diff antigen NEGATIVE NEGATIVE Final   C Diff toxin NEGATIVE NEGATIVE Final   C Diff interpretation No C. difficile detected.  Final    Comment: Performed at [facility], [address] [place], [place] [address]    Radiology Reports No results found.  SIGNED: Adriana DELENA Grams, MD, FHM. FAAFP. Jolynn Pack - [facility] Time spent - 55 min.  In seeing, evaluating and examining the patient. Reviewing medical records, labs, drawn plan of care. [facility],  Pager (please use [email or web address] to page/  text) Please use Epic Secure Chat for non-urgent communication (7AM-7PM)  If 7PM-7AM, please contact night-coverage [email or web address], 06/13/2024, 11:13 AM

## 2024-06-13 NOTE — Plan of Care (Signed)
  Problem: Acute Rehab PT Goals(only PT should resolve) Goal: Pt Will Go Supine/Side To Sit Outcome: Progressing Flowsheets (Taken 06/13/2024 1234) Pt will go Supine/Side to Sit:  with contact guard assist  with minimal assist Goal: Patient Will Transfer Sit To/From Stand Outcome: Progressing Flowsheets (Taken 06/13/2024 1234) Patient will transfer sit to/from stand:  with contact guard assist  with minimal assist Goal: Pt Will Transfer Bed To Chair/Chair To Bed Outcome: Progressing Flowsheets (Taken 06/13/2024 1234) Pt will Transfer Bed to Chair/Chair to Bed:  with contact guard assist  with min assist Goal: Pt Will Ambulate Outcome: Progressing Flowsheets (Taken 06/13/2024 1234) Pt will Ambulate:  25 feet  with contact guard assist  with minimal assist  with rolling walker   12:34 PM, 06/13/24 Lynwood Music, MPT Physical Therapist with [facility] [phone] office [phone] mobile phone

## 2024-06-13 NOTE — Care Management Obs Status (Signed)
 MEDICARE OBSERVATION STATUS NOTIFICATION   Patient Details  Name: Megan [name] MRN: [id number] Date of Birth: 03/17/1961   Medicare Observation Status Notification Given:  Yes    [name] L Kais Monje 06/13/2024, 4:52 PM

## 2024-06-13 NOTE — Plan of Care (Signed)
  Problem: Pain Managment: Goal: General experience of comfort will improve and/or be controlled Outcome: Progressing   Problem: Safety: Goal: Ability to remain free from injury will improve Outcome: Progressing

## 2024-06-13 NOTE — Procedures (Signed)
 Received patient in bed to unit.  Alert and oriented.  Informed consent signed and in chart.  All procedures explained. LUA AVF cannulated x 2 with 15g needles per policy. Tx initiated per MD order. UF goal 1000 ml.  TX duration:3.5 hours  Tx complete. Blood returned. Needles removed, sites held x 2 until hemostasis achieved.  Patient tolerated well.  Transported back to the room  Alert, without acute distress.  Hand-off given to patient's nurse.   Access used: LUA AVF Access issues: none  Total UF removed: 1000 ml Medication(s) given: See MAR   [facility]

## 2024-06-13 NOTE — Consult Note (Addendum)
 [facility]  INPATIENT CONSULTATION  Reason for Consultation: ESRD on HD Requesting Provider: Dr. Willette  HPI: Megan [name] is an 63 y.o. female with ESRD on HD, metastatic breast cancer, DM, HTN, GAVE admitted with diarrhea and abd pain, nephrology is consulted for comanagement of ESRD and assoc conditions.   Admitted 10/26-29 with neutropenic fever, diarrhea, septic shock.  Had dialysis, w/u largely negative.  Ultimately decided to go home with hospice.  Was discharged 10/29 but returned 10/30 with ongoing diarrhea and abd pain + desire for full scope of care.    Patient is seen with husband bedside.  It seems they did not have a full understanding that hospice means discontinuation of dialysis and other therapies.  Patient and family specifically want to continue dialysis and chemotherapy at this time.  Husband says have appt next week to d/w oncologist re: new treatment regimen, though consult note from earlier this week indicates plan is to continue with same regimen at 50% reduced dose.    This admission BP is 130-140s consistently and she's afebrile.  K 3.2, total WBC 0.9, Hb 8s, LFTs normal.  During this week's prior admission CT a/p showed no acute abd pathology (innumerable mets noted), C diff was neg.  Appears diarrhea may be a chemo side effect.   PMH: Past Medical History:  Diagnosis Date   (HFpEF) heart failure with preserved ejection fraction (HCC)    a. 01/2019 Echo: EF 55-60%, mild conc LVH. DD.  Torn MV chordae.   Anemia    Atypical chest pain    a. 08/2018 MV: EF 59%, no ischemia; b. 02/2019 Cath: nonobs dzs.   Blood transfusion without reported diagnosis    Breast cancer (HCC) 10/12/2020   Cataract    ESRD (end stage renal disease) on dialysis [facility])    a. HD T, T, S   Essential hypertension, benign    GERD (gastroesophageal reflux disease)    Headache    Hemorrhoids    Mixed hyperlipidemia    Morbid obesity (HCC)    Non-obstructive CAD  (coronary artery disease)    a. 02/2019 CathL LM nl, LAD 33m, LCX nl, RCA 25p, EF 55-65%.   PONV (postoperative nausea and vomiting)    Renal insufficiency    S/P colonoscopy 08/2009   Dr. Rollin: sessile polyp (benign lymphoid), large hemorrhoids, repeat 5-10 years   Temporal arteritis (HCC)    Type 2 diabetes mellitus (HCC)    Wears glasses    PSH: Past Surgical History:  Procedure Laterality Date   A/V SHUNT INTERVENTION Left 04/16/2024   Procedure: A/V SHUNT INTERVENTION;  Surgeon: Pearline Norman RAMAN, MD;  Location: [facility];  Service: Cardiovascular;  Laterality: Left;   ABDOMINAL HYSTERECTOMY     APPENDECTOMY     ARTERY BIOPSY N/A 05/09/2018   Procedure: RIGHT TEMPORAL ARTERY BIOPSY;  Surgeon: Kimble Agent, MD;  Location: [facility] OR;  Service: General;  Laterality: N/A;   BIOPSY  10/04/2023   Procedure: BIOPSY;  Surgeon: Shaaron Lamar HERO, MD;  Location: [facility];  Service: Endoscopy;;   BREAST BIOPSY Right 06/15/2020   Procedure: RIGHT BREAST BIOPSY;  Surgeon: Mavis Anes, MD;  Location: [facility];  Service: General;  Laterality: Right;   CATARACT EXTRACTION W/PHACO Left 02/09/2017   Procedure: CATARACT EXTRACTION PHACO AND INTRAOCULAR LENS PLACEMENT LEFT EYE;  Surgeon: Perley Hamilton, MD;  Location: [facility];  Service: Ophthalmology;  Laterality: Left;  CDE: 4.89   CATARACT EXTRACTION W/PHACO Right 06/04/2017   Procedure:  CATARACT EXTRACTION PHACO AND INTRAOCULAR LENS PLACEMENT (IOC);  Surgeon: Perley Hamilton, MD;  Location: [facility];  Service: Ophthalmology;  Laterality: Right;  CDE: 4.12   CHOLECYSTECTOMY  09/29/2011   Procedure: LAPAROSCOPIC CHOLECYSTECTOMY;  Surgeon: Oneil DELENA Budge, MD;  Location: [facility];  Service: General;  Laterality: N/A;   COLONOSCOPY  08/2009   Dr. Rollin: sessile polyp (benign lymphoid), large hemorrhoids, repeat 5-10 years   COLONOSCOPY N/A 06/12/2016   prominent hemorrhoids   COLONOSCOPY WITH PROPOFOL  N/A 06/16/2021   Procedure: COLONOSCOPY WITH PROPOFOL ;  Surgeon:  Shaaron Lamar HERO, MD;  [facility]: [facility];  Service: Endoscopy;  Laterality: N/A;  9:30am (dialysis pt)   ESOPHAGOGASTRODUODENOSCOPY  09/05/2011   MFM:Dfjoo hiatal hernia; remainder of exam normal. No explanation for patient's abdominal pain with today's examination   ESOPHAGOGASTRODUODENOSCOPY N/A 12/17/2013   Dr. Shaaron: gastric erythema, erosion, mild chronic inflammation on path    ESOPHAGOGASTRODUODENOSCOPY N/A 12/31/2023   Procedure: EGD (ESOPHAGOGASTRODUODENOSCOPY);  Surgeon: Wilhelmenia Aloha Raddle., MD;  Location: [facility];  Service: Gastroenterology;  Laterality: N/A;   ESOPHAGOGASTRODUODENOSCOPY (EGD) WITH PROPOFOL  N/A 06/16/2021   Procedure: ESOPHAGOGASTRODUODENOSCOPY (EGD) WITH PROPOFOL ;  Surgeon: Shaaron Lamar HERO, MD;  [facility];  Service: Endoscopy;  Laterality: N/A;   ESOPHAGOGASTRODUODENOSCOPY (EGD) WITH PROPOFOL  N/A 10/04/2023   Procedure: ESOPHAGOGASTRODUODENOSCOPY (EGD) WITH PROPOFOL ;  Surgeon: Shaaron Lamar HERO, MD;  [facility]: [facility];  Service: Endoscopy;  Laterality: N/A;  200pm, ok rm 1-2, pt knows to arrive at 6:45   EUS N/A 12/31/2023   Procedure: ULTRASOUND, UPPER GI TRACT, ENDOSCOPIC;  Surgeon: Wilhelmenia Aloha Raddle., MD;  Location: [facility];  Service: Gastroenterology;  Laterality: N/A;   EXCISION OF BREAST BIOPSY Right 10/12/2020   Procedure: EXCISION OF RIGHT BREAST BIOPSY;  Surgeon: Budge Oneil, MD;  Location: [facility];  Service: General;  Laterality: Right;   IR DIALY SHUNT INTRO NEEDLE/INTRACATH INITIAL W/IMG LEFT Left 07/17/2023   LAPAROSCOPIC APPENDECTOMY  09/29/2011   Procedure: APPENDECTOMY LAPAROSCOPIC;  Surgeon: Oneil DELENA Budge, MD;  Location: [facility];  Service: General;;  incidental appendectomy   LEFT HEART CATH AND CORONARY ANGIOGRAPHY N/A 02/28/2019   Procedure: LEFT HEART CATH AND CORONARY ANGIOGRAPHY;  Surgeon: Dann Candyce RAMAN, MD;  Location: [facility];  Service: Cardiovascular;  Laterality: N/A;   MASS EXCISION  Right 01/18/2023   Procedure: EXCISION MASS, RIGHT CHEST S/P MASTECTOMY;  Surgeon: Budge Oneil, MD;  Location: [facility];  Service: General;  Laterality: Right;   MASTECTOMY MODIFIED RADICAL Right 02/18/2020   Procedure: MASTECTOMY MODIFIED RADICAL;  Surgeon: Budge Oneil, MD;  Location: [facility];  Service: General;  Laterality: Right;   MASTECTOMY, PARTIAL Right 07/13/2020   Procedure: RIGHT PARTIAL MASTECTOMY;  Surgeon: Budge Oneil, MD;  Location: [facility];  Service: General;  Laterality: Right;   PARTIAL MASTECTOMY WITH NEEDLE LOCALIZATION AND AXILLARY SENTINEL LYMPH NODE BX Right 09/18/2018   Procedure: RIGHT PARTIAL MASTECTOMY AFTER NEEDLE LOCALIZATION, SENTINEL LYMPH NODE BIOPSY RIGHT AXILLA;  Surgeon: Budge Oneil, MD;  Location: [facility];  Service: General;  Laterality: Right;   POLYPECTOMY  06/16/2021   Procedure: POLYPECTOMY;  Surgeon: Shaaron Lamar HERO, MD;  [facility];  Service: Endoscopy;;   PORT-A-CATH REMOVAL Right 11/29/2023   Procedure: REMOVAL PORT-A-CATH;  Surgeon: Budge Oneil, MD;  Location: [facility];  Service: General;  Laterality: Right;  MINOR PROCEDURE ROOM   PORTACATH PLACEMENT Right 06/07/2023   Procedure: INSERTION PORT-A-CATH, RIGHT  (DIALYSIS ACCESS ON LEFT);  Surgeon: Budge Oneil, MD;  Location:  [facility];  Service: General;  Laterality: Right;   PORTACATH PLACEMENT Right 04/03/2024   Procedure: INSERTION, TUNNELED CENTRAL VENOUS DEVICE, WITH PORT;  Surgeon: Mavis Anes, MD;  Location: [facility];  Service: General;  Laterality: Right;   SIMPLE MASTECTOMY WITH AXILLARY SENTINEL NODE BIOPSY Left 06/15/2020   Procedure: LEFT SIMPLE MASTECTOMY;  Surgeon: Mavis Anes, MD;  Location: [facility];  Service: General;  Laterality: Left;     Past Medical History:  Diagnosis Date   (HFpEF) heart failure with preserved ejection fraction (HCC)    a. 01/2019 Echo: EF 55-60%, mild conc LVH. DD.  Torn MV chordae.   Anemia    Atypical chest pain    a. 08/2018 MV: EF 59%, no  ischemia; b. 02/2019 Cath: nonobs dzs.   Blood transfusion without reported diagnosis    Breast cancer (HCC) 10/12/2020   Cataract    ESRD (end stage renal disease) on dialysis [facility])    a. HD T, T, S   Essential hypertension, benign    GERD (gastroesophageal reflux disease)    Headache    Hemorrhoids    Mixed hyperlipidemia    Morbid obesity (HCC)    Non-obstructive CAD (coronary artery disease)    a. 02/2019 CathL LM nl, LAD 63m, LCX nl, RCA 25p, EF 55-65%.   PONV (postoperative nausea and vomiting)    Renal insufficiency    S/P colonoscopy 08/2009   Dr. Rollin: sessile polyp (benign lymphoid), large hemorrhoids, repeat 5-10 years   Temporal arteritis (HCC)    Type 2 diabetes mellitus (HCC)    Wears glasses     Medications:  I have reviewed the patient's current medications.  Medications Prior to Admission  Medication Sig Dispense Refill   acetaminophen  (TYLENOL ) 500 MG tablet Take 1,000 mg by mouth every 8 (eight) hours as needed for moderate pain (pain score 4-6).     albuterol  (PROVENTIL ) (2.5 MG/3ML) 0.083% nebulizer solution Take 3 mLs (2.5 mg total) by nebulization every 4 (four) hours as needed for wheezing or shortness of breath. 75 mL 2   albuterol  (VENTOLIN  HFA) 108 (90 Base) MCG/ACT inhaler Inhale 2 puffs into the lungs every 6 (six) hours as needed for wheezing or shortness of breath. 8 g 2   aspirin  EC 81 MG tablet Take 81 mg by mouth daily.     B Complex-C-Zn-Folic Acid  (DIALYVITE  800-ZINC  15) 0.8 MG TABS Take 1 tablet by mouth daily.     chlorpheniramine-HYDROcodone  (TUSSIONEX) 10-8 MG/5ML Take 5 mLs by mouth every 12 (twelve) hours as needed (Severe coughing spells.). 230 mL 0   cinacalcet  (SENSIPAR ) 90 MG tablet Take 90 mg by mouth daily.     diphenoxylate -atropine  (LOMOTIL ) 2.5-0.025 MG tablet Take 1 tablet by mouth 4 (four) times daily as needed for diarrhea or loose stools. 120 tablet 0   fentaNYL  (DURAGESIC ) 50 MCG/HR Place 1 patch onto the skin every 3 (three)  days. 10 patch 0   fluconazole  (DIFLUCAN ) 100 MG tablet Take 1 tablet (100 mg total) by mouth every other day. 7 tablet 0   gabapentin  (NEURONTIN ) 300 MG capsule TAKE 1 CAPSULE(300 MG) BY MOUTH AT BEDTIME 90 capsule 0   guaiFENesin -codeine  100-10 MG/5ML syrup Take 5 mLs by mouth every 12 (twelve) hours as needed for cough. 120 mL 0   hydrocortisone  (ANUSOL -HC) 2.5 % rectal cream Place rectally 4 (four) times daily. 30 g 0   isosorbide  mononitrate (IMDUR ) 30 MG 24 hr tablet Take 0.5 tablets (15 mg total) by mouth daily. 15  tablet 6   lanthanum  (FOSRENOL ) 1000 MG chewable tablet Chew 2,000-3,000 mg by mouth See admin instructions. Take 3 tablets (3000 mg) by mouth with meals and take 2 tablets (2000 mg) with snacks     lidocaine  (XYLOCAINE ) 2 % solution Use as directed 15 mLs in the mouth or throat every 4 (four) hours as needed for mouth pain (Mix 1:1 with Maalox and swish and spit). 100 mL 1   lidocaine -prilocaine  (EMLA ) cream Apply to affected area once 30 g 3   loperamide  (IMODIUM  A-D) 2 MG tablet Take 2 mg by mouth 4 (four) times daily as needed for diarrhea or loose stools.     loperamide  (IMODIUM ) 2 MG capsule Take 2 tabs by mouth with first loose stool, then 1 tab with each additional loose stool as needed. Do not exceed 8 tabs in a 24-hour period 60 capsule 3   Nebulizers (COMPRESSOR/NEBULIZER) MISC 1 Units by Does not apply route daily as needed. 1 each 0   OLANZapine  (ZYPREXA ) 5 MG tablet Take 1 tablet (5 mg total) by mouth at bedtime. 60 tablet 1   ondansetron  (ZOFRAN ) 8 MG tablet Take 1 tablet (8 mg total) by mouth every 8 (eight) hours as needed for nausea or vomiting. Start on the third day after chemotherapy. 30 tablet 1   ondansetron  (ZOFRAN -ODT) 4 MG disintegrating tablet Take 1 tablet by mouth every 8 (eight) hours as needed for vomiting or nausea.     pantoprazole  (PROTONIX ) 40 MG tablet Take 1 tablet (40 mg total) by mouth 2 (two) times daily before a meal. 60 tablet 11   phenol  (CHLORASEPTIC) 1.4 % LIQD Use as directed 1 spray in the mouth or throat as needed for throat irritation / pain. 118 mL 0   prochlorperazine  (COMPAZINE ) 10 MG tablet Take 1 tablet (10 mg total) by mouth every 6 (six) hours as needed for nausea or vomiting. 30 tablet 1    ALLERGIES:   Allergies  Allergen Reactions   Motrin [Ibuprofen] Other (See Comments)    End stage renal disease    Trodelvy [Sacituzumab Govitecan-Hziy] Other (See Comments)    Wheezing     FAM HX: Family History  Problem Relation Age of Onset   Hypertension Mother    Coronary artery disease Mother    Diabetes Mother    Heart attack Father    Hypertension Sister    Coronary artery disease Sister    Hypertension Brother    Colon cancer Brother    Heart attack Maternal Grandmother    Heart attack Maternal Grandfather    Heart attack Paternal Grandmother    Heart attack Paternal Grandfather    Hypertension Son    Heart attack Maternal Aunt    Hypertension Maternal Aunt    Diabetes Maternal Aunt    Heart attack Maternal Uncle    Hypertension Maternal Uncle    Diabetes Maternal Uncle    Heart attack Paternal Aunt    Hypertension Paternal Aunt    Diabetes Paternal Aunt    Heart attack Paternal Uncle    Hypertension Paternal Uncle    Diabetes Paternal Uncle     Social History:   reports that she has never smoked. She has never been exposed to tobacco smoke. She has never used smokeless tobacco. She reports that she does not drink alcohol and does not use drugs.  ROS: 12 system ROS neg except per HPI  Blood pressure 136/80, pulse (!) 103, temperature 97.9 F (36.6 C), resp. rate 18,  weight 75.7 kg, SpO2 95%. PHYSICAL EXAM: General: tired but nontoxic HEENT: anicteric sclera, dry MMM CV: normal rate, no murmurs, no edema Lungs: bilateral chest rise, normal wob Abd: soft, non-tender, non-distended Skin: no visible lesions or rashes, right chest port  Neuro: normal speech, no gross focal deficits,  awake/alert Dialysis access: LUE AVF +b/t   Results for orders placed or performed during the hospital encounter of 06/12/24 (from the past 48 hours)  CBC with Differential     Status: Abnormal   Collection Time: 06/12/24  4:05 PM  Result Value Ref Range   WBC 0.6 (LL) 4.0 - 10.5 K/uL    Comment: WHITE COUNT CONFIRMED ON SMEAR REPEATED TO VERIFY This critical result has been called to JEFFREY SAPPELT by Mitzie Buck on 06/12/2024 17:04:07, and has been read back.    RBC 2.61 (L) 3.87 - 5.11 MIL/uL   Hemoglobin 8.8 (L) 12.0 - 15.0 g/dL   HCT 72.0 (L) 63.9 - 53.9 %   MCV 106.9 (H) 80.0 - 100.0 fL   MCH 33.7 26.0 - 34.0 pg   MCHC 31.5 30.0 - 36.0 g/dL   RDW 78.9 (H) 88.4 - 84.4 %   Platelets 52 (L) 150 - 400 K/uL    Comment: PLATELET COUNT CONFIRMED BY SMEAR SPECIMEN CHECKED FOR CLOTS Immature Platelet Fraction may be clinically indicated, consider ordering this additional test [id number]    nRBC 0.0 0.0 - 0.2 %   Neutrophils Relative % 16 %   Neutro Abs 0.1 (LL) 1.7 - 7.7 K/uL    Comment: This critical result has been called to JEFFREY SAPPELT by Mitzie Buck on 06/12/2024 17:05:12, and has been read back.   Lymphocytes Relative 71 %   Lymphs Abs 0.4 (L) 0.7 - 4.0 K/uL   Monocytes Relative 6 %   Monocytes Absolute 0.0 (L) 0.1 - 1.0 K/uL   Eosinophils Relative 5 %   Eosinophils Absolute 0.0 0.0 - 0.5 K/uL   Basophils Relative 0 %   Basophils Absolute 0.0 0.0 - 0.1 K/uL   RBC Morphology MORPHOLOGY UNREMARKABLE    Metamyelocytes Relative 1 %   Myelocytes 1 %   Abs Immature Granulocytes 0.00 0.00 - 0.07 K/uL   Reactive, Benign Lymphocytes PRESENT     Comment: Performed at [facility], [address] [place], [place] [address]  Comprehensive metabolic panel     Status: Abnormal   Collection Time: 06/12/24  4:05 PM  Result Value Ref Range   Sodium 140 135 - 145 mmol/L   Potassium 3.4 (L) 3.5 - 5.1 mmol/L   Chloride 97 (L) 98 - 111 mmol/L   CO2 26 22 - 32 mmol/L    Glucose, Bld 85 70 - 99 mg/dL    Comment: Glucose reference range applies only to samples taken after fasting for at least 8 hours.   BUN 56 (H) 8 - 23 mg/dL   Creatinine, Ser 3.24 (H) 0.44 - 1.00 mg/dL   Calcium  9.2 8.9 - 10.3 mg/dL   Total Protein 5.8 (L) 6.5 - 8.1 g/dL   Albumin 2.7 (L) 3.5 - 5.0 g/dL   AST 10 (L) 15 - 41 U/L   ALT 13 0 - 44 U/L   Alkaline Phosphatase 85 38 - 126 U/L   Total Bilirubin 0.9 0.0 - 1.2 mg/dL   GFR, Estimated 6 (L) >60 mL/min    Comment: (NOTE) Calculated using the CKD-EPI Creatinine Equation (2021)    Anion gap 18 (H) 5 - 15    Comment:  Performed at [facility], [address], [place], [place] [address]  Lipase, blood     Status: None   Collection Time: 06/12/24  4:05 PM  Result Value Ref Range   Lipase 48 11 - 51 U/L    Comment: Performed at [facility], [address] [place], [place] [address]  C Difficile Quick Screen w PCR reflex     Status: None   Collection Time: 06/13/24  3:38 AM   Specimen: STOOL  Result Value Ref Range   C Diff antigen NEGATIVE NEGATIVE   C Diff toxin NEGATIVE NEGATIVE   C Diff interpretation No C. difficile detected.     Comment: Performed at [facility], [address] [place], [place] [address]  Basic metabolic panel     Status: Abnormal   Collection Time: 06/13/24  4:13 AM  Result Value Ref Range   Sodium 140 135 - 145 mmol/L   Potassium 3.2 (L) 3.5 - 5.1 mmol/L   Chloride 97 (L) 98 - 111 mmol/L   CO2 26 22 - 32 mmol/L   Glucose, Bld 131 (H) 70 - 99 mg/dL    Comment: Glucose reference range applies only to samples taken after fasting for at least 8 hours.   BUN 66 (H) 8 - 23 mg/dL   Creatinine, Ser 2.13 (H) 0.44 - 1.00 mg/dL   Calcium  8.9 8.9 - 10.3 mg/dL   GFR, Estimated 5 (L) >60 mL/min    Comment: (NOTE) Calculated using the CKD-EPI Creatinine Equation (2021)    Anion gap 17 (H) 5 - 15    Comment: Performed at [facility], [address] [place], [place] [address]  CBC     Status: Abnormal    Collection Time: 06/13/24  4:13 AM  Result Value Ref Range   WBC 0.8 (LL) 4.0 - 10.5 K/uL    Comment: REPEATED TO VERIFY CRITICAL VALUE NOTED.  VALUE IS CONSISTENT WITH PREVIOUSLY REPORTED AND CALLED VALUE.    RBC 2.51 (L) 3.87 - 5.11 MIL/uL   Hemoglobin 8.5 (L) 12.0 - 15.0 g/dL   HCT 73.1 (L) 63.9 - 53.9 %   MCV 106.8 (H) 80.0 - 100.0 fL   MCH 33.9 26.0 - 34.0 pg   MCHC 31.7 30.0 - 36.0 g/dL   RDW 79.5 (H) 88.4 - 84.4 %   Platelets 47 (L) 150 - 400 K/uL    Comment: PLATELET COUNT CONFIRMED BY SMEAR REPEATED TO VERIFY Immature Platelet Fraction may be clinically indicated, consider ordering this additional test [id number]    nRBC 0.0 0.0 - 0.2 %    Comment: Performed at [facility], [address] [place], [place] [address]  CBC     Status: Abnormal   Collection Time: 06/13/24  7:49 AM  Result Value Ref Range   WBC 0.9 (LL) 4.0 - 10.5 K/uL    Comment: REPEATED TO VERIFY This critical result has been called to jakeema m by Ulla Echevaria on 06/13/2024 08:19:00, and has been read back.    RBC 2.40 (L) 3.87 - 5.11 MIL/uL   Hemoglobin 8.0 (L) 12.0 - 15.0 g/dL   HCT 74.2 (L) 63.9 - 53.9 %   MCV 107.1 (H) 80.0 - 100.0 fL   MCH 33.3 26.0 - 34.0 pg   MCHC 31.1 30.0 - 36.0 g/dL   RDW 79.5 (H) 88.4 - 84.4 %   Platelets 44 (L) 150 - 400 K/uL    Comment: SPECIMEN CHECKED FOR CLOTS PLATELETS APPEAR ADEQUATE PLATELET COUNT CONFIRMED BY SMEAR Immature  Platelet Fraction may be clinically indicated, consider ordering this additional test [id number]    nRBC 0.0 0.0 - 0.2 %    Comment: Performed at [facility], [address], [place], [place] [address]   *Note: Due to a large number of results and/or encounters for the requested time period, some results have not been displayed. A complete set of results can be found in Results Review.    No results found.  Assessment/Recommendations:   ESRD -outpatient HD orders: [facility] MWF. F180. EDW 79.7kg.  aVF 15-gauge, flow rates: 400/800.   2K/2.5 calcium .  Heparin : 2100 units bolus.  Meds: Aranesp  160 mcg once a week (last dose 10/24), Venofer  50 mg once a week, calcitriol  1.5 mcg 3 times weekly - readmitted to resume full scope scope of care after recently d/c to hospice but changed mind -will see how she tolerates HD here today -SW alerting [facility] she plans to continue fo now    Metastatic breast cancer Extensive pulmonary metastasis -recent admission during which oncology was following along -per notes s/p 2 doses of sacituzumab govitecan given with plans for dose reduction for 3rd dose but she subsequently decided to return home with hospice -now desires ongoing treatment - onc will need to weight in regarding ongoing candidacy - husband says has appt this week outpt which should be fine  Neutropenia:  total WBC 0.9, rec'd gCSF recenlty, afebrile at this time; per primary.    Rectal pain Prolapsed hemorrhoids -pain mgmt per primary service   Diarrhea: imaging ok, c diff neg -supportive care, likely chemo side effect   Hypokalemia -K 3.2, will use 4K dialysate today   FTT -secondary to malignancy and chemo -per primary   Volume/ history of hypertension  -Hold any BP meds. UF as hemodynamics tolerates - does not appear to have much volume on   Anemia of Chronic Kidney Disease Pancytopenia Hemoglobin 8, holding ESA in the context of advancing malignancy and on chemo  Transfuse PRN   Secondary Hyperparathyroidism/Hyperphosphatemia - resume home binders, takes lanthanum  at home but utilizing a lower dose -renal diet  -on sensipar    Disposition As above forgo hospice and resume full scope as she tolerates.   We need confirmation that she tolerates HD today and has an outpt HD chair prior to hosp d/c.   Nephrology will not plan to see over the weekend but if need arises or we can be of assistance we're available - please call the covering MD listed in Amion any time.  Will resume inpatient care Monday if she  remains admitted.   Manuelita DELENA Barters 06/13/2024, 8:54 AM

## 2024-06-14 DIAGNOSIS — N186 End stage renal disease: Secondary | ICD-10-CM | POA: Diagnosis not present

## 2024-06-14 DIAGNOSIS — R197 Diarrhea, unspecified: Secondary | ICD-10-CM | POA: Diagnosis not present

## 2024-06-14 DIAGNOSIS — Z992 Dependence on renal dialysis: Secondary | ICD-10-CM | POA: Diagnosis not present

## 2024-06-14 DIAGNOSIS — R531 Weakness: Secondary | ICD-10-CM | POA: Diagnosis not present

## 2024-06-14 DIAGNOSIS — N040 Nephrotic syndrome with minor glomerular abnormality: Secondary | ICD-10-CM | POA: Diagnosis not present

## 2024-06-14 LAB — GASTROINTESTINAL PANEL BY PCR, STOOL (REPLACES STOOL CULTURE)

## 2024-06-14 LAB — BASIC METABOLIC PANEL WITH GFR
Anion gap: 12 (ref 5–15)
BUN: 29 mg/dL — ABNORMAL HIGH (ref 8–23)
CO2: 23 mmol/L (ref 22–32)
Calcium: 7 mg/dL — ABNORMAL LOW (ref 8.9–10.3)
Chloride: 106 mmol/L (ref 98–111)
Creatinine, Ser: 3.9 mg/dL — ABNORMAL HIGH (ref 0.44–1.00)
GFR, Estimated: 12 mL/min — ABNORMAL LOW (ref >60)
Glucose, Bld: 89 mg/dL (ref 70–99)
Potassium: 2.9 mmol/L — ABNORMAL LOW (ref 3.5–5.1)
Sodium: 141 mmol/L (ref 135–145)

## 2024-06-14 MED ORDER — NYSTATIN 100000 UNIT/ML MT SUSP: 5.0000 mL | Freq: Four times a day (QID) | OROMUCOSAL | 0 refills | Status: DC

## 2024-06-14 MED ORDER — ASPIRIN 81 MG PO TBEC
81.0000 mg | DELAYED_RELEASE_TABLET | Freq: Every day | ORAL | Status: DC
  Administered 2024-06-14: 81 mg via ORAL
  Filled 2024-06-14: qty 1

## 2024-06-14 MED ORDER — SALINE SPRAY 0.65 % NA SOLN: 1.0000 | NASAL | Status: DC | PRN

## 2024-06-14 MED ORDER — WITCH HAZEL-GLYCERIN EX PADS: MEDICATED_PAD | CUTANEOUS | Status: DC | PRN

## 2024-06-14 MED ORDER — MIDODRINE HCL 5 MG PO TABS
2.5000 mg | ORAL_TABLET | Freq: Three times a day (TID) | ORAL | Status: DC
  Administered 2024-06-14 (×2): 2.5 mg via ORAL
  Filled 2024-06-14 (×2): qty 1

## 2024-06-14 MED ORDER — FLORANEX PO PACK: 1.0000 g | PACK | Freq: Three times a day (TID) | ORAL | 0 refills | Status: DC

## 2024-06-14 MED ORDER — GABAPENTIN 300 MG PO CAPS
300.0000 mg | ORAL_CAPSULE | Freq: Every day | ORAL | Status: DC
  Administered 2024-06-14: 300 mg via ORAL

## 2024-06-14 MED ORDER — NYSTATIN 100000 UNIT/ML MT SUSP: 5.0000 mL | Freq: Four times a day (QID) | OROMUCOSAL | Status: DC

## 2024-06-14 MED ORDER — FLUCONAZOLE 100 MG PO TABS
100.0000 mg | ORAL_TABLET | ORAL | Status: DC
  Administered 2024-06-14: 100 mg via ORAL
  Filled 2024-06-14: qty 1

## 2024-06-14 MED ORDER — MIDODRINE HCL 2.5 MG PO TABS: 2.5000 mg | ORAL_TABLET | Freq: Three times a day (TID) | ORAL | 0 refills | Status: AC

## 2024-06-14 MED ORDER — POTASSIUM CHLORIDE CRYS ER 20 MEQ PO TBCR
40.0000 meq | EXTENDED_RELEASE_TABLET | Freq: Once | ORAL | Status: AC
  Administered 2024-06-14: 40 meq via ORAL
  Filled 2024-06-14: qty 2

## 2024-06-14 MED ORDER — PANTOPRAZOLE SODIUM 40 MG IV SOLR
40.0000 mg | INTRAVENOUS | Status: DC
  Administered 2024-06-14: 40 mg via INTRAVENOUS
  Filled 2024-06-14: qty 10

## 2024-06-14 MED ORDER — HEPARIN SOD (PORK) LOCK FLUSH 100 UNIT/ML IV SOLN
500.0000 [IU] | Freq: Once | INTRAVENOUS | Status: AC
  Administered 2024-06-14: 500 [IU] via INTRAVENOUS
  Filled 2024-06-14: qty 5

## 2024-06-14 MED ORDER — HYDROCORTISONE 1 % EX CREA
TOPICAL_CREAM | Freq: Three times a day (TID) | CUTANEOUS | Status: DC
  Filled 2024-06-14: qty 28

## 2024-06-14 NOTE — Plan of Care (Signed)
   Problem: Activity: Goal: Risk for activity intolerance will decrease Outcome: Progressing   Problem: Coping: Goal: Level of anxiety will decrease Outcome: Progressing   Problem: Pain Managment: Goal: General experience of comfort will improve and/or be controlled Outcome: Progressing

## 2024-06-14 NOTE — TOC Transition Note (Signed)
 Transition of Care [facility]) - Discharge Note   Patient Details  Name: Megan [name] MRN: [id number] Date of Birth: Dec 27, 1960  [facility]) CM/SW Contact:  Sharlyne Stabs, RN Phone Number: 06/14/2024, 2:23 PM   Clinical Narrative:   Family is ready for patient to discharge home. CM spouse with her spouse, Megan [name]. They do not want DME picked up. Hospice had [facility] delivery a Hospital bed, Egg crate, over the bed table, wheelchair and bedside commode.  CM called Tammy at [facility]. MD ordered DME, CM faxed to [phone]. They will bill on Monday and not pick up the DME. CM explained to Megan [name], that insurance may not cover all the DME, They will bill and call him next week to discuss payment vs pick up or renting. He is requesting HHRN, no preferences.  Referral sent to [facility] with [facility]. MD ordered. [facility] will follow for palliative care services.  Patient discharging home.    Final next level of care: Home w Home Health Services Barriers to Discharge: Barriers Resolved   Patient Goals and CMS Choice Patient states their goals for this hospitalization and ongoing recovery are:: return home CMS Medicare.gov Compare Post Acute Care list provided to:: Patient Represenative (must comment) Choice offered to / list presented to : Spouse, Patient    Discharge Placement            Name of family member notified: spouse Patient and family notified of of transfer: 06/14/24  Discharge Plan and Services Additional resources added to the After Visit Summary for   In-house Referral: Clinical Social Work Discharge Planning Services: CM Consult Post Acute Care Choice: Home Health          DME Arranged: Wheelchair manual, Hospital bed, 3-N-1 DME Agency: [facility] Date DME Agency Contacted: 06/14/24 Time DME Agency Contacted: [phone] Representative spoke with at DME Agency: faxed to Tammy HH Arranged: RN, PT [facility] Agency: [facility]  ([facility]) Date HH Agency Contacted: 06/14/24 Time HH Agency Contacted: [phone] Representative spoke with at [facility] Agency: Selinda  Social Drivers of Health (SDOH) Interventions SDOH Screenings   Food Insecurity: No Food Insecurity (06/12/2024)  Housing: Low Risk  (06/12/2024)  Transportation Needs: No Transportation Needs (06/12/2024)  Utilities: Not At Risk (06/12/2024)  Alcohol Screen: Low Risk  (09/20/2023)  Depression (PHQ2-9): Low Risk  (06/05/2024)  Financial Resource Strain: Low Risk  (09/20/2023)  Physical Activity: Inactive (09/20/2023)  Social Connections: Unknown (06/08/2024)  Stress: No Stress Concern Present (09/20/2023)  Tobacco Use: Low Risk  (06/12/2024)     Readmission Risk Interventions    06/09/2024    4:05 PM 06/08/2024    8:34 AM 05/24/2024    2:23 PM  Readmission Risk Prevention Plan  Transportation Screening Complete Complete Complete  Medication Review (RN Care Manager) Complete Complete Complete  PCP or Specialist appointment within 3-5 days of discharge  Complete Not Complete  HRI or Home Care Consult Complete Complete Complete  SW Recovery Care/Counseling Consult Complete Complete Complete  Palliative Care Screening Not Applicable Not Applicable Not Applicable  Skilled Nursing Facility Not Applicable Not Applicable Not Applicable

## 2024-06-14 NOTE — Progress Notes (Signed)
 PROGRESS NOTE    Patient: Megan [name]                            PCP: Duanne Butler DASEN, MD                    DOB: 05-29-1961            DOA: 06/12/2024 FMW:[id number]             DOS: 06/14/2024, 11:00 AM   LOS: 0 days   Date of Service: The patient was seen and examined on 06/14/2024  Subjective:   The patient was seen and examined this morning, much more awake alert, following commands Tolerated hemodialysis yesterday  Husband present bedside, medication has been reviewed Explained why some medications has been discontinued and changed due to her mental status and low blood pressure  Brief Narrative:   Megan [name] is a 63 y.o. female with medical history significant for metastatic breast cancer, ESRD, diabetes mellitus, HTN, GAVE. Patient presented to the ED with complaints of persistent diarrhea and abdominal pain.   Patient was just discharged home yesterday to begin home hospice today.  Hospitalized 10/26 to 10/29, presented with diarrhea, for severe neutropenia, status post recent chemotherapy, neutropenic fever, and severe sepsis with septic shock. She required norepinephrine drip for about 24 hours.  Treated with broad-spectrum antibiotics.  Blood cultures negative.  CTAP WC -no acute findings, or metastatic findings.   Patient and husband changed her mind today-they no longer want hospice care, rather wants to continue full scope of treatment and explore chemotherapy options.  They tell me they were unaware that patient will be stopping dialysis.  And this was the deal break for them starting hospice care. Since getting home, she has not been able to take anything orally, she is unable to ambulate, she is very weak, with persistent diarrhea and abdominal pain, she has had 3 episodes of diarrhea today.  Blood pressure has not been able to tolerate outpatient dialysis.     Assessment & Plan:   Principal Problem:   Generalized weakness Active Problems:    Essential hypertension, benign   Diarrhea   Anemia in chronic kidney disease   Pancytopenia (HCC)   Breast cancer (HCC)   ESRD (end stage renal disease) on dialysis (HCC)    Diarrhea, abdominal pain - Mildly somnolent likely due to pain medication, improved pain with current regimen -Per husband reduced episodes of diarrhea - Improved abdominal pain, diarrhea  - Recent 10/26- CT AP  W contrast-no acute findings, findings consistent with diffuse metastasis.   Hypotensive during recent hospitalization -blood pressure has improved, stabilized -S/P gentle IV fluid hydration  - Stool C. difficile, GI pathogen panel - Clear liquid diet -advancing as tolerated  - IV Dilaudid  0.5 every 4 hourly as needed  - Per oncology notes 10/27-use Imodium  4 mg as needed, if not controlled -Lomotil  as needed, can consider starting prednisone  if not controlled with both Imodium  and Lomotil  - Will start Lomotil  as needed   ESRD -schedule Monday Wednesday Friday.  Blood pressure has not tolerated outpatient dialysis.   - Nephrology consulted --continue with hemodialysis as scheduled - S/p hemodialysis 06/13/2024-tolerated well  Hypotensive History of HTN, now continuously  - Blood pressure remains soft, small dose of midodrine  2.5 mg 3 times daily added   Hypokalemia  - Persistent, on hemodialysis - Gentle potassium replacement-p.o. daily -  Monitoring closely  -  Discontinue BP meds including Imdur    Pancytopenia, severe neutropenia- -  WBC 0.6, hemoglobin 8.8, platelets 52. Received a dose of pegfilgrastim  on 06/06/24 thru the oncology center.   Likely secondary to chemotherapy. - May need to reach out to oncology if with repeat dose would be beneficial   Failure to thrive, generalized weakness-poor prognosis, with diffuse metastatic disease.   Metastatic breast cancer -Mets to lungs and liver, follows with Dr. Davonna.   Onset of diarrhea after last chemotherapy fusion 10/23.     Chronic anemia -monitoring, hemoglobin stable   Chronic respiratory failure on 2 L stable.... No signs of distress  Ethics - patient remains DNR/DNI, but has revoked hospice care  ----------------------------------------------------------------------------------------------------------------------------------------------- Nutritional status:  The patient's BMI is: Body mass index is 30.24 kg/m. ------------------------------------------------------------------------------------------------------------------------------------------------  DVT prophylaxis:  SCDs Start: 06/12/24 2014   Code Status:   Code Status: Limited: Do not attempt resuscitation (DNR) -DNR-LIMITED -Do Not Intubate/DNI   Family Communication: Husband present at bedside, updated -Advance care planning has been discussed.   Admission status:   Status is: Observation The patient remains OBS appropriate and will d/c before 2 midnights.   Disposition: From  - home             Planning for discharge in 1-2 days back home Hemodialysis reestablished    Procedures:   No admission procedures for hospital encounter.   Antimicrobials:  Anti-infectives (From admission, onward)    Start     Dose/Rate Route Frequency Ordered Stop   06/14/24 1000  fluconazole  (DIFLUCAN ) tablet 100 mg        100 mg Oral Every other day 06/14/24 0750          Medication:   aspirin  EC  81 mg Oral Daily   Chlorhexidine  Gluconate Cloth  6 each Topical Q0600   cinacalcet   90 mg Oral Q breakfast   fluconazole   100 mg Oral QODAY   gabapentin   300 mg Oral QHS   hydrocortisone  cream   Topical TID   lactobacillus  1 g Oral TID WC   midodrine   2.5 mg Oral TID WC   pantoprazole  (PROTONIX ) IV  40 mg Intravenous Q24H    acetaminophen  **OR** acetaminophen , albuterol , diphenoxylate -atropine , guaiFENesin -dextromethorphan, HYDROmorphone  (DILAUDID ) injection, ondansetron  **OR** ondansetron  (ZOFRAN ) IV, sodium chloride , witch  hazel-glycerin   Objective:   Vitals:   06/13/24 1933 06/13/24 1936 06/13/24 2001 06/14/24 0502  BP: 109/89 97/66 (!) 127/53 (!) 105/51  Pulse: (!) 116 (!) 112 98 96  Resp: 14 (!) 26 20 15   Temp:  (!) 97.5 F (36.4 C) 97.9 F (36.6 C)   TempSrc:  Oral Oral   SpO2: 100% 99% 98% 96%  Weight:  75 kg      Intake/Output Summary (Last 24 hours) at 06/14/2024 1100 Last data filed at 06/14/2024 0900 Gross per 24 hour  Intake 980 ml  Output 0 ml  Net 980 ml   Filed Weights   06/12/24 2008 06/13/24 1540 06/13/24 1936  Weight: 75.7 kg 76.3 kg 75 kg     Physical examination:     General:  AAO x 3,  cooperative, no distress;   HEENT:  Normocephalic, PERRL, otherwise with in Normal limits   Neuro:  CNII-XII intact. , normal motor and sensation, reflexes intact   Lungs:   Clear to auscultation BL, Respirations unlabored,  No wheezes / crackles  Cardio:    S1/S2, RRR, No murmure, No Rubs or Gallops   Abdomen:  Soft, non-tender, bowel sounds active  all four quadrants, no guarding or peritoneal signs.  Muscular  skeletal:  Bilateral upper extremity edema-  left greater than right Limited exam -global generalized weaknesses - in bed, able to move all 4 extremities,   2+ pulses,  symmetric, No pitting edema  Skin:  Dry, warm to touch, negative for any Rashes,  Wounds: Please see nursing documentation      ------------------------------------------------------------------------------------------------------------------------------------------    LABs:     Latest Ref Rng & Units 06/13/2024    7:49 AM 06/13/2024    4:13 AM 06/12/2024    4:05 PM  CBC  WBC 4.0 - 10.5 K/uL 0.9  0.8  0.6   Hemoglobin 12.0 - 15.0 g/dL 8.0  8.5  8.8   Hematocrit 36.0 - 46.0 % 25.7  26.8  27.9   Platelets 150 - 400 K/uL 44  47  52       Latest Ref Rng & Units 06/14/2024    3:24 AM 06/13/2024    4:13 AM 06/12/2024    4:05 PM  CMP  Glucose 70 - 99 mg/dL 89  868  85   BUN 8 - 23 mg/dL 29  66  56    Creatinine 0.44 - 1.00 mg/dL 6.09  2.13  3.24   Sodium 135 - 145 mmol/L 141  140  140   Potassium 3.5 - 5.1 mmol/L 2.9  3.2  3.4   Chloride 98 - 111 mmol/L 106  97  97   CO2 22 - 32 mmol/L 23  26  26    Calcium  8.9 - 10.3 mg/dL 7.0  8.9  9.2   Total Protein 6.5 - 8.1 g/dL   5.8   Total Bilirubin 0.0 - 1.2 mg/dL   0.9   Alkaline Phos 38 - 126 U/L   85   AST 15 - 41 U/L   10   ALT 0 - 44 U/L   13        Micro Results Recent Results (from the past 240 hours)  Culture, blood (Routine X 2) w Reflex to ID Panel     Status: None   Collection Time: 06/08/24 10:25 AM   Specimen: BLOOD  Result Value Ref Range Status   Specimen Description BLOOD BLOOD RIGHT ARM  Final   Special Requests   Final    AEROBIC BOTTLE ONLY Blood Culture results may not be optimal due to an inadequate volume of blood received in culture bottles   Culture   Final    NO GROWTH 5 DAYS Performed at [facility], [address], [place], [place] [address]    Report Status 06/13/2024 FINAL  Final  Culture, blood (Routine X 2) w Reflex to ID Panel     Status: None   Collection Time: 06/08/24 10:25 AM   Specimen: BLOOD  Result Value Ref Range Status   Specimen Description BLOOD BLOOD RIGHT HAND  Final   Special Requests   Final    AEROBIC BOTTLE ONLY Blood Culture results may not be optimal due to an inadequate volume of blood received in culture bottles   Culture   Final    NO GROWTH 5 DAYS Performed at [facility], [address] [place], [place] [address]    Report Status 06/13/2024 FINAL  Final  MRSA Next Gen by PCR, Nasal     Status: None   Collection Time: 06/08/24 10:28 PM   Specimen: Nasal Mucosa; Nasal Swab  Result Value Ref Range Status   MRSA by PCR Next Gen  NOT DETECTED NOT DETECTED Final    Comment: (NOTE) The GeneXpert MRSA Assay (FDA approved for NASAL specimens only), is one component of a comprehensive MRSA colonization surveillance program. It is not intended to diagnose MRSA infection nor  to guide or monitor treatment for MRSA infections. Test performance is not FDA approved in patients less than 35 years old. Performed at [facility], [address] [place], [place] [address]   C Difficile Quick Screen w PCR reflex     Status: None   Collection Time: 06/13/24  3:38 AM   Specimen: STOOL  Result Value Ref Range Status   C Diff antigen NEGATIVE NEGATIVE Final   C Diff toxin NEGATIVE NEGATIVE Final   C Diff interpretation No C. difficile detected.  Final    Comment: Performed at [facility], [address], [place], [place] [address]    Radiology Reports No results found.  SIGNED: Adriana DELENA Grams, MD, FHM. FAAFP. [place] [facility] Time spent - 55 min.  In seeing, evaluating and examining the patient. Reviewing medical records, labs, drawn plan of care. [facility],  Pager (please use [email or web address] to page/ text) Please use Epic Secure Chat for non-urgent communication (7AM-7PM)  If 7PM-7AM, please contact night-coverage [email or web address], 06/14/2024, 11:00 AM

## 2024-06-14 NOTE — Discharge Summary (Signed)
 Physician Discharge Summary   Patient: Megan [name] MRN: [id number] DOB: June 25, 1961  Admit date:     06/12/2024  Discharge date: 06/14/24  Discharge Physician: Adriana DELENA Grams   PCP: Duanne Butler DASEN, MD   Recommendations at discharge:   Follow-up with a nephrologist and schedule hemodialysis Follow-up with the palliative care team Follow-up with oncologist Dr. Murlean in 1-2 weeks Follow-up with PCP in 1-2 weeks Current medications are subject to change by PCP oncologist and palliative care team  Discharge Diagnoses: Principal Problem:   Generalized weakness Active Problems:   Essential hypertension, benign   Diarrhea   Anemia in chronic kidney disease   Pancytopenia (HCC)   Breast cancer (HCC)   ESRD (end stage renal disease) on dialysis [facility])    Hospital Course: Megan [name] is a 63 y.o. female with medical history significant for metastatic breast cancer, ESRD, diabetes mellitus, HTN, GAVE. Patient presented to the ED with complaints of persistent diarrhea and abdominal pain.   Patient was just discharged home yesterday to begin home hospice today.  Hospitalized 10/26 to 10/29, presented with diarrhea, for severe neutropenia, status post recent chemotherapy, neutropenic fever, and severe sepsis with septic shock. She required norepinephrine drip for about 24 hours.  Treated with broad-spectrum antibiotics.  Blood cultures negative.  CTAP WC -no acute findings, or metastatic findings.   Patient and husband changed her mind today-they no longer want hospice care, rather wants to continue full scope of treatment and explore chemotherapy options.  They tell me they were unaware that patient will be stopping dialysis.  And this was the deal break for them starting hospice care. Since getting home, she has not been able to take anything orally, she is unable to ambulate, she is very weak, with persistent diarrhea and abdominal pain, she has had 3 episodes of  diarrhea today.  Blood pressure has not been able to tolerate outpatient dialysis.     Diarrhea, abdominal pain -Mentation much improved, sedative meds has been modified and discontinued -Per husband reduced episodes of diarrhea - Improved abdominal pain, diarrhea   - Recent 10/26- CT AP  W contrast-no acute findings, findings consistent with diffuse metastasis.   Hypotensive during recent hospitalization -blood pressure has improved, stabilized -S/P gentle IV fluid hydration    - Clear liquid diet -advancing as tolerated   - May continue as needed home fentanyl    - Per oncology notes 10/27-use Imodium  4 mg as needed, if not controlled -Lomotil  as needed, can consider starting prednisone  if not controlled with both Imodium  and Lomotil  - Will start Lomotil  as needed   ESRD -schedule Monday Wednesday Friday.  Blood pressure has not tolerated outpatient dialysis.   - Nephrology consulted --continue with hemodialysis as scheduled - S/p hemodialysis 06/13/2024-tolerated well   Hypotensive History of HTN, now continuously  - Blood pressure remains soft, small dose of midodrine  2.5 mg 3 times daily added     Hypokalemia  - Persistent, on hemodialysis - Gentle potassium replacement-p.o. daily -  Monitoring closely  - Discontinue BP meds including Imdur      Pancytopenia, severe neutropenia- -  WBC 0.6, hemoglobin 8.8, platelets 52. Received a dose of pegfilgrastim  on 06/06/24 thru the oncology center.   Likely secondary to chemotherapy. - May need to reach out to oncology if with repeat dose would be beneficial   Failure to thrive, generalized weakness-poor prognosis, with diffuse metastatic disease.   Metastatic breast cancer -Mets to lungs and liver, follows with Dr. Davonna.  Onset of diarrhea after last chemotherapy fusion 10/23.     Chronic anemia -monitoring, hemoglobin stable   Chronic respiratory failure on 2 L stable.... No signs of distress   Ethics - patient  remains DNR/DNI, but has revoked hospice care  Consultants: Palliative care, nephrologist, Procedures performed: Hemodialysis  Disposition: Home with home health -all home DME including hospital bed, bedside commode, and wheelchair was ordered Home health RN and PT has been ordered   Diet recommendation:  Discharge Diet Orders (From admission, onward)     Start     Ordered   06/14/24 0000  Diet - low sodium heart healthy        06/14/24 1442           Renal diet DISCHARGE MEDICATION: Allergies as of 06/14/2024       Reactions   Motrin [ibuprofen] Other (See Comments)   End stage renal disease    Trodelvy [sacituzumab Govitecan-hziy] Other (See Comments)   Wheezing         Medication List     PAUSE taking these medications    chlorpheniramine-HYDROcodone  10-8 MG/5ML Wait to take this until: June 18, 2024 Commonly known as: TUSSIONEX Take 5 mLs by mouth every 12 (twelve) hours as needed (Severe coughing spells.).   fentaNYL  50 MCG/HR Wait to take this until: June 24, 2024 Commonly known as: DURAGESIC  Place 1 patch onto the skin every 3 (three) days.   prochlorperazine  10 MG tablet Wait to take this until: June 25, 2024 Commonly known as: COMPAZINE  Take 1 tablet (10 mg total) by mouth every 6 (six) hours as needed for nausea or vomiting.       STOP taking these medications    isosorbide  mononitrate 30 MG 24 hr tablet Commonly known as: IMDUR    OLANZapine  5 MG tablet Commonly known as: ZyPREXA    ondansetron  4 MG disintegrating tablet Commonly known as: ZOFRAN -ODT       TAKE these medications    acetaminophen  500 MG tablet Commonly known as: TYLENOL  Take 1,000 mg by mouth every 8 (eight) hours as needed for moderate pain (pain score 4-6).   albuterol  (2.5 MG/3ML) 0.083% nebulizer solution Commonly known as: PROVENTIL  Take 3 mLs (2.5 mg total) by nebulization every 4 (four) hours as needed for wheezing or shortness of breath.    albuterol  108 (90 Base) MCG/ACT inhaler Commonly known as: VENTOLIN  HFA Inhale 2 puffs into the lungs every 6 (six) hours as needed for wheezing or shortness of breath.   aspirin  EC 81 MG tablet Take 81 mg by mouth daily.   cinacalcet  90 MG tablet Commonly known as: SENSIPAR  Take 90 mg by mouth daily.   Compressor/Nebulizer Misc 1 Units by Does not apply route daily as needed.   Dialyvite  800-Zinc  15 0.8 MG Tabs Take 1 tablet by mouth daily.   diphenoxylate -atropine  2.5-0.025 MG tablet Commonly known as: LOMOTIL  Take 1 tablet by mouth 4 (four) times daily as needed for diarrhea or loose stools.   fluconazole  100 MG tablet Commonly known as: DIFLUCAN  Take 1 tablet (100 mg total) by mouth every other day.   gabapentin  300 MG capsule Commonly known as: NEURONTIN  TAKE 1 CAPSULE(300 MG) BY MOUTH AT BEDTIME   guaiFENesin -codeine  100-10 MG/5ML syrup Take 5 mLs by mouth every 12 (twelve) hours as needed for cough.   hydrocortisone  2.5 % rectal cream Commonly known as: ANUSOL -HC Place rectally 4 (four) times daily.   lactobacillus Pack Take 1 packet (1 g total) by mouth 3 (three) times  daily with meals.   lanthanum  1000 MG chewable tablet Commonly known as: FOSRENOL  Chew 2,000-3,000 mg by mouth See admin instructions. Take 3 tablets (3000 mg) by mouth with meals and take 2 tablets (2000 mg) with snacks   lidocaine  2 % solution Commonly known as: XYLOCAINE  Use as directed 15 mLs in the mouth or throat every 4 (four) hours as needed for mouth pain (Mix 1:1 with Maalox and swish and spit).   lidocaine -prilocaine  cream Commonly known as: EMLA  Apply to affected area once   loperamide  2 MG tablet Commonly known as: IMODIUM  A-D Take 2 mg by mouth 4 (four) times daily as needed for diarrhea or loose stools.   loperamide  2 MG capsule Commonly known as: IMODIUM  Take 2 tabs by mouth with first loose stool, then 1 tab with each additional loose stool as needed. Do not exceed 8  tabs in a 24-hour period   midodrine  2.5 MG tablet Commonly known as: PROAMATINE  Take 1 tablet (2.5 mg total) by mouth 3 (three) times daily with meals.   nystatin 100000 UNIT/ML suspension Commonly known as: MYCOSTATIN Take 5 mLs (500,000 Units total) by mouth 4 (four) times daily.   ondansetron  8 MG tablet Commonly known as: Zofran  Take 1 tablet (8 mg total) by mouth every 8 (eight) hours as needed for nausea or vomiting. Start on the third day after chemotherapy.   pantoprazole  40 MG tablet Commonly known as: PROTONIX  Take 1 tablet (40 mg total) by mouth 2 (two) times daily before a meal.   phenol 1.4 % Liqd Commonly known as: CHLORASEPTIC Use as directed 1 spray in the mouth or throat as needed for throat irritation / pain.               Durable Medical Equipment  (From admission, onward)           Start     Ordered   06/14/24 1236  For home use only DME Hospital bed  Once       Question Answer Comment  Length of Need 12 Months   Bed type Semi-electric      06/14/24 1236   06/14/24 1236  For home use only DME Bedside commode  Once       Question:  Patient needs a bedside commode to treat with the following condition  Answer:  Debility   06/14/24 1236   06/14/24 1235  For home use only DME Other see comment  Once       Comments: Over the bed table Egg crate  Question:  Length of Need  Answer:  Lifetime   06/14/24 1235   06/14/24 0000  For home use only DME standard manual wheelchair with seat cushion       Comments: Patient suffers from severe debility which impairs their ability to perform daily activities like bathing, dressing, feeding, grooming, and toileting in the home.  A walker will not resolve issue with performing activities of daily living. A wheelchair will allow patient to safely perform daily activities. Patient can safely propel the wheelchair in the home or has a caregiver who can provide assistance. Length of need 6 months . Accessories:  elevating leg rests (ELRs), wheel locks, extensions and anti-tippers.   06/14/24 1442   06/13/24 1336  For home use only DME Walker rolling  Once       Comments: Patient at risk for falls due to weakness when walking without AD, safer using RW with good return for use demonstrated.  Question  Answer Comment  Walker: With 5 Inch Wheels   Patient needs a walker to treat with the following condition Gait difficulty      06/13/24 1336            Contact information for follow-up providers     [facility] Follow up.   Contact information: [phone]             Contact information for after-discharge care     Home Medical Care     [facility]) .   Service: Home Health Services Contact information: [phone] [place]  [address] [phone]                    Discharge Exam: Fredricka Weights   06/12/24 2008 06/13/24 1540 06/13/24 1936  Weight: 75.7 kg 76.3 kg 75 kg        General:  AAO x 3,  cooperative, no distress;   HEENT:  Normocephalic, PERRL, otherwise with in Normal limits   Neuro:  CNII-XII intact. , normal motor and sensation, reflexes intact   Lungs:   Clear to auscultation BL, Respirations unlabored,  No wheezes / crackles  Cardio:    S1/S2, RRR, No murmure, No Rubs or Gallops   Abdomen:  Soft, non-tender, bowel sounds active all four quadrants, no guarding or peritoneal signs.  Muscular  skeletal:  Limited exam -global generalized weaknesses - in bed, able to move all 4 extremities,   2+ pulses,  symmetric, No pitting edema  Skin:  Dry, warm to touch, negative for any Rashes,  Wounds: Please see nursing documentation      Condition at discharge: fair  The results of significant diagnostics from this hospitalization (including imaging, microbiology, ancillary and laboratory) are listed below for reference.   Imaging Studies: DG Chest Port 1 View Result Date: 06/08/2024 CLINICAL DATA:   Sepsis. EXAM: PORTABLE CHEST 1 VIEW COMPARISON:  Radiograph and CT 05/23/2024 FINDINGS: Right chest port remains in place. Extensive pulmonary metastasis again seen. There is overall increase in heterogeneous bilateral lung opacities. Stable heart size and mediastinal contours. Right pleural effusion on CT is not well demonstrated by radiograph. IMPRESSION: Extensive pulmonary metastasis. Overall increase in heterogeneous bilateral lung opacities, progression in disease, superimposed infection or edema. Electronically Signed   By: Andrea Gasman M.D.   On: 06/08/2024 13:53   CT ABDOMEN PELVIS W CONTRAST Result Date: 06/08/2024 EXAM: CT ABDOMEN AND PELVIS WITH CONTRAST 06/08/2024 03:05:08 AM TECHNIQUE: CT of the abdomen and pelvis was performed with the administration of 100 mL of iohexol  (OMNIPAQUE ) 300 MG/ML solution. Multiplanar reformatted images are provided for review. Automated exposure control, iterative reconstruction, and/or weight-based adjustment of the mA/kV was utilized to reduce the radiation dose to as low as reasonably achievable. COMPARISON: PET CT dated 05/22/2024. CLINICAL HISTORY: Abdominal pain, acute, nonlocalized. Pt with c/o abdominal cramping and loose stools (5) since Chemo on Thurs and dialysis on Fri. Husband states pt has had 2 Imodium  prior to arrival. FINDINGS: LOWER CHEST: Innumerable pulmonary metastases, similar to recent PET. 15 mm short axis left axillary nodal metastasis (image 1), incompletely visualized, grossly unchanged. LIVER: 14 mm inferior right hepatic metastasis (image 19), unchanged. GALLBLADDER AND BILE DUCTS: Status post cholecystectomy. No biliary ductal dilatation. SPLEEN: No acute abnormality. PANCREAS: No acute abnormality. ADRENAL GLANDS: No acute abnormality. KIDNEYS, URETERS AND BLADDER: Severe bilateral renal cortical scarring/atrophy. Numerous bilateral renal calculi measuring up to 7 mm on the right. Small simple bilateral renal cysts  measuring up to  15 mm on the left, benign. Per consensus, no follow-up is needed for simple Bosniak type 1 and 2 renal cysts. No hydronephrosis. No perinephric or periureteral stranding. Urinary bladder is unremarkable. GI AND BOWEL: Stomach demonstrates no acute abnormality. There is no bowel obstruction. PERITONEUM AND RETROPERITONEUM: No ascites. No free air. VASCULATURE: Atherosclerotic calcifications of the abdominal aorta and branch vessels, although patent. Aorta is normal in caliber. LYMPH NODES: No lymphadenopathy. REPRODUCTIVE ORGANS: Status post hysterectomy. BONES AND SOFT TISSUES: No acute osseous abnormality. No focal soft tissue abnormality. IMPRESSION: 1. No acute findings. 2. Innumerable pulmonary metastases, left axillary nodal metastasis, and inferior right hepatic metastasis, similar to recent PET. 3. Additional ancillary findings, as above. Electronically signed by: Pinkie Pebbles MD 06/08/2024 03:12 AM EDT RP Workstation: [id number]   NM PET Image Restag (PS) Skull Base To Thigh Result Date: 05/24/2024 CLINICAL DATA:  Subsequent treatment strategy for breast cancer. EXAM: NUCLEAR MEDICINE PET SKULL BASE TO THIGH TECHNIQUE: 8.8 mCi F-18 FDG was injected intravenously. Full-ring PET imaging was performed from the skull base to thigh after the radiotracer. CT data was obtained and used for attenuation correction and anatomic localization. Fasting blood glucose: 122 mg/dl COMPARISON:  93/80/7974. FINDINGS: Mediastinal blood pool activity: SUV max 2.7 Liver activity: SUV max NA NECK: Low left internal jugular lymph hypermetabolic lymph nodes, SUV max 8.0. Incidental CT findings: None. CHEST: New hypermetabolic nodules and masses throughout the lungs with hypermetabolic mediastinal, hilar, left subpectoral/axillary and extrapleural adenopathy. Incidental CT findings: Right IJ Port-A-Cath terminates in the low SVC. Atherosclerotic calcification of the aorta and coronary arteries. Heart is enlarged. There may be  trace right pleural fluid. No pericardial effusion. ABDOMEN/PELVIS: New right hepatic lobe metastasis measures 1.7 cm, SUV max 12.0. New hypermetabolic retrocaval 8 mm lymph node (202/88), SUV max 7.5. Incidental CT findings: Cholecystectomy. Kidneys are atrophic with small high and low-attenuation lesions, too small to characterize. Numerous bilateral renal stones. SKELETON: Focal intercostal metastasis between the posterior right eleventh and twelfth ribs, SUV max 6.6. No definite osseous hypermetabolism. Incidental CT findings: Minimal degenerative change in the spine. IMPRESSION: 1. Marked progression of metastatic breast cancer as evidenced by widespread pulmonary metastatic disease, progressive low neck, chest and upper abdominal adenopathy, new liver metastasis and new intercostal metastasis. 2. Bilateral renal stones. Electronically Signed   By: Newell Eke M.D.   On: 05/24/2024 11:21   CT Angio Chest PE W and/or Wo Contrast Result Date: 05/23/2024 CLINICAL DATA:  Shortness of breath since last night. Stage IV breast cancer. EXAM: CT ANGIOGRAPHY CHEST WITH CONTRAST TECHNIQUE: Multidetector CT imaging of the chest was performed using the standard protocol during bolus administration of intravenous contrast. Multiplanar CT image reconstructions and MIPs were obtained to evaluate the vascular anatomy. RADIATION DOSE REDUCTION: This exam was performed according to the departmental dose-optimization program which includes automated exposure control, adjustment of the mA and/or kV according to patient size and/or use of iterative reconstruction technique. CONTRAST:  75mL OMNIPAQUE  IOHEXOL  350 MG/ML SOLN COMPARISON:  Chest radiographs dated 05/23/2024 and 05/12/2024. PET-CT dated 05/22/2024 and chest CT dated 05/12/2024. FINDINGS: Cardiovascular: Normally opacified pulmonary arteries with no pulmonary arterial filling defects seen. Breathing motion artifacts in the lower lung zones. Atheromatous  calcifications, including the coronary arteries and aorta. Borderline enlarged heart. No pericardial effusion. Mediastinum/Nodes: Multiple enlarged mediastinal and right hilar nodes without significant change since 05/12/2024. The previously demonstrated 1.8 cm short axis precarinal node has a corresponding short axis diameter today of 1.8 cm  on image number 32/5. Unremarkable thyroid  gland, trachea and esophagus. Lungs/Pleura: Marked increase in size and number of lung nodules and masses throughout both lungs. The largest masses in the posterior right upper lobe, measuring 6.8 x 3.8 cm on image number 46/7, previously 5.9 x 3.5 cm on 05/12/2024. Small right pleural effusion with improvement. Upper Abdomen: Poorly defined right lobe liver mass measuring 1.4 cm on image number 82/5, previously 1.2 cm. Musculoskeletal: Mild thoracic spine degenerative changes. No evidence of bony metastatic disease. Review of the MIP images confirms the above findings. IMPRESSION: 1. No pulmonary emboli. 2. Marked increase in size and number of lung nodules and masses throughout both lungs, compatible with progressive metastatic disease. 3. Stable mediastinal and right hilar adenopathy compatible with metastatic adenopathy. 4. Slight increase in size of a right lobe liver mass suspicious for a liver metastasis. 5. Small right pleural effusion with improvement. 6. Calcific coronary artery and aortic atherosclerosis. Aortic Atherosclerosis (ICD10-I70.0). Electronically Signed   By: Elspeth Bathe M.D.   On: 05/23/2024 09:10   DG Chest Portable 1 View Result Date: 05/23/2024 EXAM: 1 VIEW(S) XRAY OF THE CHEST 05/23/2024 05:32:00 AM COMPARISON: Chest CT and radiograph 05/12/2024. CLINICAL HISTORY: 63 year old female with metastatic breast cancer, evaluation for SOB, persistent coughing, and worsening SHOB. FINDINGS: LINES, TUBES AND DEVICES: Stable right chest power port. LUNGS AND PLEURA: Extensive bilateral pulmonary masses in keeping  with widespread lung metastases. Lung volumes and ventilation are not significantly changed from last month. Pneumothorax identified. No pulmonary edema. No pleural effusion. HEART AND MEDIASTINUM: Mediastinal contours are not significantly changed from last month. No acute abnormality of the cardiac silhouette. BONES AND SOFT TISSUES: Stable right chest wall surgical clips. No acute osseous abnormality. UPPER ABDOMEN: Paucity of bowel gas. IMPRESSION: 1. Extensive pulmonary metastatic disease. Stable ventilation from last month. No new cardiopulmonary abnormality. Electronically signed by: Helayne Hurst MD 05/23/2024 06:05 AM EDT RP Workstation: [id number]    Microbiology: Results for orders placed or performed during the hospital encounter of 06/12/24  C Difficile Quick Screen w PCR reflex     Status: None   Collection Time: 06/13/24  3:38 AM   Specimen: STOOL  Result Value Ref Range Status   C Diff antigen NEGATIVE NEGATIVE Final   C Diff toxin NEGATIVE NEGATIVE Final   C Diff interpretation No C. difficile detected.  Final    Comment: Performed at [facility], [address], [place], [place] [address]  Gastrointestinal Panel by PCR , Stool     Status: None   Collection Time: 06/13/24  3:38 AM   Specimen: STOOL  Result Value Ref Range Status   Campylobacter species NOT DETECTED NOT DETECTED Final   Plesimonas shigelloides NOT DETECTED NOT DETECTED Final   Salmonella species NOT DETECTED NOT DETECTED Final   Yersinia enterocolitica NOT DETECTED NOT DETECTED Final   Vibrio species NOT DETECTED NOT DETECTED Final   Vibrio cholerae NOT DETECTED NOT DETECTED Final   Enteroaggregative E coli (EAEC) NOT DETECTED NOT DETECTED Final   Enteropathogenic E coli (EPEC) NOT DETECTED NOT DETECTED Final   Enterotoxigenic E coli (ETEC) NOT DETECTED NOT DETECTED Final   Shiga like toxin producing E coli (STEC) NOT DETECTED NOT DETECTED Final   Shigella/Enteroinvasive E coli (EIEC) NOT DETECTED NOT  DETECTED Final   Cryptosporidium NOT DETECTED NOT DETECTED Final   Cyclospora cayetanensis NOT DETECTED NOT DETECTED Final   Entamoeba histolytica NOT DETECTED NOT DETECTED Final   Giardia lamblia NOT DETECTED NOT DETECTED  Final   Adenovirus F40/41 NOT DETECTED NOT DETECTED Final   Astrovirus NOT DETECTED NOT DETECTED Final   Norovirus GI/GII NOT DETECTED NOT DETECTED Final   Rotavirus A NOT DETECTED NOT DETECTED Final   Sapovirus (I, II, IV, and V) NOT DETECTED NOT DETECTED Final    Comment: Performed at [facility], [address] [place], [place] [address]   *Note: Due to a large number of results and/or encounters for the requested time period, some results have not been displayed. A complete set of results can be found in Results Review.    Labs: CBC: Recent Labs  Lab 06/08/24 1131 06/09/24 0412 06/10/24 0407 06/10/24 1936 06/11/24 0427 06/12/24 1605 06/13/24 0413 06/13/24 0749  WBC 0.3* 0.2* 0.4*  --  0.4* 0.6* 0.8* 0.9*  NEUTROABS 0.2* 0.0* 0.1*  --  0.1* 0.1*  --   --   HGB 8.0* 7.3* 6.8* 8.1* 7.9* 8.8* 8.5* 8.0*  HCT 26.1* 23.4* 21.9* 25.3* 24.9* 27.9* 26.8* 25.7*  MCV 115.5* 113.6* 111.2*  --  106.0* 106.9* 106.8* 107.1*  PLT 116* 94* 88*  --  60* 52* 47* 44*   Basic Metabolic Panel: Recent Labs  Lab 06/08/24 0205 06/09/24 0412 06/10/24 0407 06/11/24 0427 06/12/24 1605 06/13/24 0413 06/14/24 0324  NA 140   < > 138 140 140 140 141  K 4.4   < > 4.1 3.8 3.4* 3.2* 2.9*  CL 96*   < > 95* 97* 97* 97* 106  CO2 24   < > 25 24 26 26 23   GLUCOSE 135*   < > 134* 93 85 131* 89  BUN 75*   < > 47* 67* 56* 66* 29*  CREATININE 8.97*   < > 6.38* 8.16* 6.75* 7.86* 3.90*  CALCIUM  8.2*   < > 8.9 8.8* 9.2 8.9 7.0*  MG 2.5*  --   --   --   --   --   --   PHOS 10.3*  --  7.5* 8.0*  --   --   --    < > = values in this interval not displayed.   Liver Function Tests: Recent Labs  Lab 06/08/24 0205 06/09/24 0412 06/10/24 0407 06/11/24 0427 06/12/24 1605   AST 21 15  --   --  10*  ALT 19 14  --   --  13  ALKPHOS 83 63  --   --  85  BILITOT 0.3 0.3  --   --  0.9  PROT 5.6* 4.9*  --   --  5.8*  ALBUMIN 3.0* 2.6* 2.8* 2.6* 2.7*   CBG: No results for input(s): GLUCAP in the last 168 hours.  Discharge time spent: greater than 45 minutes.  Signed: Adriana DELENA Grams, MD [facility] 06/14/2024

## 2024-06-14 NOTE — Progress Notes (Signed)
 Pts husband continues to express displeasure with hospital staff giving pts meds. MD Manfred and Charge Nurse made aware. Adefeso followed up with pts concern, came to pts bedside to discuss pts care. List of medications are scheduled for day shift. Pain medication of Dilaudid  not given due to pts low BP's. New orders were placed for Gabapentin , Protonix . Prn Robitussin and Tylenol  were given for cough and pain. Pt currently sleeping, rise and fall of chest visualized. Call light within pts reach, pts husband remains vigilant at her bedside. Care ongoing.

## 2024-06-14 NOTE — Plan of Care (Signed)
  Problem: Education: Goal: Knowledge of General Education information will improve Description: Including pain rating scale, medication(s)/side effects and non-pharmacologic comfort measures Outcome: Adequate for Discharge   Problem: Health Behavior/Discharge Planning: Goal: Ability to manage health-related needs will improve Outcome: Adequate for Discharge   Problem: Clinical Measurements: Goal: Ability to maintain clinical measurements within normal limits will improve Outcome: Adequate for Discharge Goal: Will remain free from infection Outcome: Adequate for Discharge Goal: Diagnostic test results will improve Outcome: Adequate for Discharge Goal: Respiratory complications will improve Outcome: Adequate for Discharge Goal: Cardiovascular complication will be avoided Outcome: Adequate for Discharge   Problem: Activity: Goal: Risk for activity intolerance will decrease Outcome: Adequate for Discharge   Problem: Nutrition: Goal: Adequate nutrition will be maintained Outcome: Adequate for Discharge   Problem: Coping: Goal: Level of anxiety will decrease Outcome: Adequate for Discharge   Problem: Elimination: Goal: Will not experience complications related to bowel motility Outcome: Adequate for Discharge Goal: Will not experience complications related to urinary retention Outcome: Adequate for Discharge   Problem: Pain Managment: Goal: General experience of comfort will improve and/or be controlled Outcome: Adequate for Discharge   Problem: Safety: Goal: Ability to remain free from injury will improve Outcome: Adequate for Discharge   Problem: Skin Integrity: Goal: Risk for impaired skin integrity will decrease Outcome: Adequate for Discharge   Problem: Acute Rehab PT Goals(only PT should resolve) Goal: Pt Will Go Supine/Side To Sit Outcome: Adequate for Discharge Goal: Patient Will Transfer Sit To/From Stand Outcome: Adequate for Discharge Goal: Pt Will  Transfer Bed To Chair/Chair To Bed Outcome: Adequate for Discharge Goal: Pt Will Ambulate Outcome: Adequate for Discharge

## 2024-06-14 NOTE — Progress Notes (Signed)
 I reviewed the patient's discharge paper work and her medications. I compared the [facility] to this admission with her discharge papers and the only medications she did not get tonight was her gabapentin  and her Zyprexa . All other meds she had orders for. Dr. Adefeso gave me the order for gabapentin  because I knew the reason was for neuropathy. The nurse, Andrea, stated the husband has not mentioned to her about the patient being in any pain. After talking with the husband I pulled from the pyxis gabapentin , lomotil  and tylenol . He stated that she had diarrhea down in dialysis and that was why he wanted that medication.

## 2024-06-16 ENCOUNTER — Telehealth: Payer: Self-pay

## 2024-06-16 ENCOUNTER — Encounter: Payer: Self-pay | Admitting: Radiology

## 2024-06-16 DIAGNOSIS — D631 Anemia in chronic kidney disease: Secondary | ICD-10-CM | POA: Diagnosis not present

## 2024-06-16 DIAGNOSIS — N2581 Secondary hyperparathyroidism of renal origin: Secondary | ICD-10-CM | POA: Diagnosis not present

## 2024-06-16 DIAGNOSIS — D509 Iron deficiency anemia, unspecified: Secondary | ICD-10-CM | POA: Diagnosis not present

## 2024-06-16 DIAGNOSIS — N186 End stage renal disease: Secondary | ICD-10-CM | POA: Diagnosis not present

## 2024-06-16 DIAGNOSIS — Z992 Dependence on renal dialysis: Secondary | ICD-10-CM | POA: Diagnosis not present

## 2024-06-16 NOTE — Transitions of Care (Post Inpatient/ED Visit) (Unsigned)
   06/16/2024  Name: Megan [name] MRN: [id number] DOB: [birth year]  Today's TOC FU Call Status: Today's TOC FU Call Status:: Unsuccessful Call (1st Attempt) Unsuccessful Call (1st Attempt) Date: 06/16/24  Attempted to reach the patient regarding the most recent Inpatient/ED visit.  Follow Up Plan: Additional outreach attempts will be made to reach the patient to complete the Transitions of Care (Post Inpatient/ED visit) call.   Signature  Julian Lemmings, LPN [facility] Nurse Health Advisor Direct Dial  [phone]

## 2024-06-16 NOTE — Progress Notes (Signed)
 Late note entry 06/16/24, 0923  D/c over weekend noted. Contacted out-pt HD clinic, [facility], to inform that pt was d/c and should have returned this morning. No further support needed.  Lavanda Karliah [place] Dialysis Navigator [phone]

## 2024-06-17 ENCOUNTER — Telehealth: Payer: Self-pay

## 2024-06-17 LAB — HEPATITIS B SURFACE ANTIBODY, QUANTITATIVE: Hep B S AB Quant (Post): 62.1 m[IU]/mL

## 2024-06-17 NOTE — Transitions of Care (Post Inpatient/ED Visit) (Signed)
 06/17/2024  Name: Megan [name] MRN: [id number] DOB: Jun 13, 1961  Today's TOC FU Call Status: Today's TOC FU Call Status:: Successful TOC FU Call Completed Unsuccessful Call (1st Attempt) Date: 06/16/24 [facility] FU Call Complete Date: 06/17/24 Patient's Name and Date of Birth confirmed.  Transition Care Management Follow-up Telephone Call Date of Discharge: 06/14/24 [facility] Type of Discharge: Inpatient Admission Primary Inpatient Discharge Diagnosis:: dehydration How have you been since you were released from the hospital?: Better Any questions or concerns?: No  Items Reviewed: Did you receive and understand the discharge instructions provided?: Yes Medications obtained,verified, and reconciled?: Yes (Medications Reviewed) Any new allergies since your discharge?: No Dietary orders reviewed?: Yes People in Home [RPT]: spouse, sibling(s)  Medications Reviewed Today: Medications Reviewed Today     Reviewed by Emmitt Pan, LPN (Licensed Practical Nurse) on 06/17/24 at 1036  Med List Status: <None>   Medication Order Taking? Sig Documenting Provider Last Dose Status Informant  acetaminophen  (TYLENOL ) 500 MG tablet [id number] Yes Take 1,000 mg by mouth every 8 (eight) hours as needed for moderate pain (pain score 4-6). [provider]  Active Spouse/Significant Other, Pharmacy Records, Self           Med Note PATTRICIA, MONTIE HERO   Fri Jun 13, 2024  9:25 AM) PRN  albuterol  (PROVENTIL ) (2.5 MG/3ML) 0.083% nebulizer solution [id number] Yes Take 3 mLs (2.5 mg total) by nebulization every 4 (four) hours as needed for wheezing or shortness of breath. Pearlean Manus, MD  Active Spouse/Significant Other, Pharmacy Records, Self           Med Note PATTRICIA, MONTIE HERO   Fri Jun 13, 2024  9:25 AM) PRN  albuterol  (VENTOLIN  HFA) 108 [phone] Base) MCG/ACT inhaler [id number] Yes Inhale 2 puffs into the lungs every 6 (six) hours as needed for wheezing or shortness  of breath. Pearlean Manus, MD  Active Spouse/Significant Other, Pharmacy Records, Self           Med Note PATTRICIA MONTIE HERO   Fri Jun 13, 2024  9:26 AM) PRN  aspirin  EC 81 MG tablet [id number] Yes Take 81 mg by mouth daily. [provider]  Active Spouse/Significant Other, Pharmacy Records, Self  B Complex-C-Zn-Folic Acid  (DIALYVITE  800-ZINC  15) 0.8 MG TABS [id number] Yes Take 1 tablet by mouth daily. [provider]  Active Spouse/Significant Other, Pharmacy Records, Self  chlorpheniramine-HYDROcodone  (TUSSIONEX) 10-8 MG/5ML [id number]  Take 5 mLs by mouth every 12 (twelve) hours as needed (Severe coughing spells.).  Patient not taking: Reported on 06/17/2024   Ricky Fines, MD  Active Spouse/Significant Other, Pharmacy Records, Self           Med Note PATTRICIA MONTIE HERO   Fri Jun 13, 2024  9:27 AM) PRN  cinacalcet  (SENSIPAR ) 90 MG tablet [id number] Yes Take 90 mg by mouth daily. [provider]  Active Spouse/Significant Other, Pharmacy Records, Self           Med Note DARILYN STEPHANE GORMAN Pablo May 12, 2024  1:50 PM) Thursday 05/08/24 started back  diphenoxylate -atropine  (LOMOTIL ) 2.5-0.025 MG tablet [id number] Yes Take 1 tablet by mouth 4 (four) times daily as needed for diarrhea or loose stools. Kandala, Hyndavi, MD  Active Spouse/Significant Other, Pharmacy Records, Self           Med Note PATTRICIA, MONTIE HERO   Fri May 23, 2024 11:41 AM) prn  fentaNYL  (DURAGESIC ) 50 MCG/HR [id number]  Place 1 patch onto the skin every 3 (three) days.  Patient not taking: Reported on 06/17/2024   Davonna Siad, MD  Active Spouse/Significant Other, Pharmacy Records, Self  fluconazole  (DIFLUCAN ) 100 MG tablet [id number] Yes Take 1 tablet (100 mg total) by mouth every other day. Kandala, Hyndavi, MD  Active Spouse/Significant Other, Pharmacy Records, Self  gabapentin  (NEURONTIN ) 300 MG capsule [id number] Yes TAKE 1 CAPSULE(300 MG) BY MOUTH AT BEDTIME Duanne Butler DASEN, MD  Active Self,  Spouse/Significant Other, Pharmacy Records  guaiFENesin -codeine  100-10 MG/5ML syrup [id number] Yes Take 5 mLs by mouth every 12 (twelve) hours as needed for cough. Kandala, Hyndavi, MD  Active Spouse/Significant Other, Pharmacy Records, Self           Med Note PATTRICIA MONTIE HERO   Fri Jun 13, 2024  9:28 AM) PRN  hydrocortisone  (ANUSOL -HC) 2.5 % rectal cream [id number] Yes Place rectally 4 (four) times daily. Evonnie Lenis, MD  Active Self, Spouse/Significant Other, Pharmacy Records  lactobacillus (FLORANEX/LACTINEX) PACK [id number] Yes Take 1 packet (1 g total) by mouth 3 (three) times daily with meals. Willette Adriana LABOR, MD  Active   lanthanum  (FOSRENOL ) 1000 MG chewable tablet [id number] Yes Chew 2,000-3,000 mg by mouth See admin instructions. Take 3 tablets (3000 mg) by mouth with meals and take 2 tablets (2000 mg) with snacks [provider]  Active Spouse/Significant Other, Pharmacy Records, Self           Med Note CLAUD, MICHEAL DASEN   Tue Oct 05, 2020 11:02 AM)    lidocaine  (XYLOCAINE ) 2 % solution [id number] Yes Use as directed 15 mLs in the mouth or throat every 4 (four) hours as needed for mouth pain (Mix 1:1 with Maalox and swish and spit). Kandala, Hyndavi, MD  Active Spouse/Significant Other, Pharmacy Records, Self  lidocaine -prilocaine  (EMLA ) cream [id number] Yes Apply to affected area once Kandala, Hyndavi, MD  Active Spouse/Significant Other, Pharmacy Records, Self           Med Note PATTRICIA, MONTIE HERO   Fri Jun 13, 2024  9:30 AM) PRN  loperamide  (IMODIUM  A-D) 2 MG tablet [id number] Yes Take 2 mg by mouth 4 (four) times daily as needed for diarrhea or loose stools. [provider]  Active Spouse/Significant Other, Pharmacy Records, Self  loperamide  (IMODIUM ) 2 MG capsule [id number] Yes Take 2 tabs by mouth with first loose stool, then 1 tab with each additional loose stool as needed. Do not exceed 8 tabs in a 24-hour period Davonna Siad, MD  Active Spouse/Significant Other,  Pharmacy Records, Self  midodrine  (PROAMATINE ) 2.5 MG tablet [id number] Yes Take 1 tablet (2.5 mg total) by mouth 3 (three) times daily with meals. Willette Adriana LABOR, MD  Active   Nebulizers (COMPRESSOR/NEBULIZER) MISC [id number] Yes 1 Units by Does not apply route daily as needed. Pearlean Manus, MD  Active Spouse/Significant Other, Pharmacy Records, Self           Med Note PATTRICIA, MONTIE HERO   Fri May 23, 2024 11:44 AM) prn  nystatin (MYCOSTATIN) 100000 UNIT/ML suspension [id number] Yes Take 5 mLs (500,000 Units total) by mouth 4 (four) times daily. Willette Adriana LABOR, MD  Active   ondansetron  (ZOFRAN ) 8 MG tablet [id number] Yes Take 1 tablet (8 mg total) by mouth every 8 (eight) hours as needed for nausea or vomiting. Start on the third day after chemotherapy. Davonna Siad, MD  Active Spouse/Significant Other, Pharmacy Records, Self           Med Note PATTRICIA, MONTIE HERO   Fri Jun 13, 2024  9:32 AM) PRN  pantoprazole  (  PROTONIX ) 40 MG tablet [id number] Yes Take 1 tablet (40 mg total) by mouth 2 (two) times daily before a meal. Rourk, Lamar HERO, MD  Active Spouse/Significant Other, Pharmacy Records, Self  phenol (CHLORASEPTIC) 1.4 % LIQD [id number] Yes Use as directed 1 spray in the mouth or throat as needed for throat irritation / pain. Ricky Fines, MD  Active Spouse/Significant Other, Pharmacy Records, Self           Med Note PATTRICIA, MONTIE HERO   Fri Jun 13, 2024  9:34 AM) PRN  prochlorperazine  (COMPAZINE ) 10 MG tablet [id number]  Take 1 tablet (10 mg total) by mouth every 6 (six) hours as needed for nausea or vomiting.  Patient not taking: Reported on 06/17/2024   Davonna Siad, MD  Active Spouse/Significant Other, Pharmacy Records, Self           Med Note PATTRICIA MONTIE HERO   Fri Jun 13, 2024  9:35 AM) PRN  Med List Note Teretha Renaee SAILOR, RPH-CPP 04/10/24 1401): Xeloda  (capecitabine ) filled at [facility] (Specialty)            Home Care and Equipment/Supplies: Were  Home Health Services Ordered?: NA Any new equipment or medical supplies ordered?: NA  Functional Questionnaire: Do you need assistance with bathing/showering or dressing?: No Do you need assistance with meal preparation?: No Do you need assistance with eating?: No Do you have difficulty maintaining continence: No Do you need assistance with getting out of bed/getting out of a chair/moving?: No Do you have difficulty managing or taking your medications?: No  Follow up appointments reviewed: PCP Follow-up appointment confirmed?: Yes MD Provider Line [phone] Given: No Date of PCP follow-up appointment?: 06/20/24 Follow-up Provider: Duanne Do you need transportation to your follow-up appointment?: No Do you understand care options if your condition(s) worsen?: Yes-patient verbalized understanding    SIGNATURE Julian Lemmings, LPN [facility] Nurse Health Advisor Direct Dial  [phone]

## 2024-06-17 NOTE — Telephone Encounter (Signed)
 Received notification that pt had questions regarding palliative care appt on 11/11. RN called pt, explained what palliative care is and what support is provided. Pt reports she is doing much better and has no symptoms at this time that she would need palliative care for. Patient also does not wish to travel [place]. RN answered all questions to pt satisfaction, appt canceled and pt educated to call with any further questions or to discuss palliative with oncology team. Pt also verbalized the support of in-home palliative care through [facility], no further needs at this time. Referral closed.

## 2024-06-17 NOTE — Telephone Encounter (Signed)
 Copied from CRM #[id number]. Topic: Medical Record Request - Provider/Facility Request >> Jun 16, 2024  3:41 PM Ivette P wrote: Reason for CRM: Tylene called to notify received palliative care for pt and [facility] will follow.      [phone]

## 2024-06-18 DIAGNOSIS — N2581 Secondary hyperparathyroidism of renal origin: Secondary | ICD-10-CM | POA: Diagnosis not present

## 2024-06-18 DIAGNOSIS — N186 End stage renal disease: Secondary | ICD-10-CM | POA: Diagnosis not present

## 2024-06-18 DIAGNOSIS — Z992 Dependence on renal dialysis: Secondary | ICD-10-CM | POA: Diagnosis not present

## 2024-06-18 DIAGNOSIS — D631 Anemia in chronic kidney disease: Secondary | ICD-10-CM | POA: Diagnosis not present

## 2024-06-18 DIAGNOSIS — D509 Iron deficiency anemia, unspecified: Secondary | ICD-10-CM | POA: Diagnosis not present

## 2024-06-18 DIAGNOSIS — R5081 Fever presenting with conditions classified elsewhere: Secondary | ICD-10-CM | POA: Insufficient documentation

## 2024-06-19 ENCOUNTER — Inpatient Hospital Stay

## 2024-06-19 ENCOUNTER — Inpatient Hospital Stay: Admitting: Oncology

## 2024-06-20 ENCOUNTER — Inpatient Hospital Stay

## 2024-06-20 ENCOUNTER — Encounter: Payer: Self-pay | Admitting: Family Medicine

## 2024-06-20 ENCOUNTER — Ambulatory Visit (INDEPENDENT_AMBULATORY_CARE_PROVIDER_SITE_OTHER): Admitting: Family Medicine

## 2024-06-20 VITALS — BP 128/62 | HR 67 | Temp 97.8°F | Ht 62.0 in | Wt 165.0 lb

## 2024-06-20 DIAGNOSIS — D631 Anemia in chronic kidney disease: Secondary | ICD-10-CM | POA: Diagnosis not present

## 2024-06-20 DIAGNOSIS — C7800 Secondary malignant neoplasm of unspecified lung: Secondary | ICD-10-CM | POA: Diagnosis not present

## 2024-06-20 DIAGNOSIS — N2581 Secondary hyperparathyroidism of renal origin: Secondary | ICD-10-CM | POA: Diagnosis not present

## 2024-06-20 DIAGNOSIS — C50919 Malignant neoplasm of unspecified site of unspecified female breast: Secondary | ICD-10-CM | POA: Diagnosis not present

## 2024-06-20 DIAGNOSIS — Z992 Dependence on renal dialysis: Secondary | ICD-10-CM | POA: Diagnosis not present

## 2024-06-20 DIAGNOSIS — D509 Iron deficiency anemia, unspecified: Secondary | ICD-10-CM | POA: Diagnosis not present

## 2024-06-20 DIAGNOSIS — N186 End stage renal disease: Secondary | ICD-10-CM | POA: Diagnosis not present

## 2024-06-20 MED ORDER — FENTANYL 50 MCG/HR TD PT72: 1.0000 | MEDICATED_PATCH | TRANSDERMAL | 0 refills | Status: DC

## 2024-06-20 NOTE — Progress Notes (Signed)
 Subjective:    Patient ID: Megan [name], female    DOB: 08/29/1960, 63 y.o.   MRN: [id number]  Patient has been in and out of the hospital several times since I last saw her.  She has been dealing with abdominal pain and diarrhea and pancytopenic fevers.  This was likely related to chemotherapy.  During her last hospitalization, the patient went home on hospice and comfort care.  However after the family realized that she would no longer be able to do dialysis they change their mind and decided to discontinue hospice.  She has an appointment Tuesday to meet with the oncologist to discuss any other treatments for metastatic breast cancer that she may be eligible for.  She has already resumed dialysis.  She is using fentanyl  50 mcg every 72 hours for pain.  She denies any chest pain or pleuritic pain or abdominal pain.  She was having a lot of that previously.  Her diarrhea has stopped however she is off chemotherapy at the present time.  She no longer has any nausea or vomiting.  In fact, the patient states she feels better now that she has 10 weeks. Past Medical History:  Diagnosis Date   (HFpEF) heart failure with preserved ejection fraction (HCC)    a. 01/2019 Echo: EF 55-60%, mild conc LVH. DD.  Torn MV chordae.   Anemia    Atypical chest pain    a. 08/2018 MV: EF 59%, no ischemia; b. 02/2019 Cath: nonobs dzs.   Blood transfusion without reported diagnosis    Breast cancer (HCC) 10/12/2020   Cataract    ESRD (end stage renal disease) on dialysis [facility])    a. HD T, T, S   Essential hypertension, benign    GERD (gastroesophageal reflux disease)    Headache    Hemorrhoids    Mixed hyperlipidemia    Morbid obesity (HCC)    Non-obstructive CAD (coronary artery disease)    a. 02/2019 CathL LM nl, LAD 93m, LCX nl, RCA 25p, EF 55-65%.   PONV (postoperative nausea and vomiting)    Renal insufficiency    S/P colonoscopy 08/2009   Dr. Rollin: sessile polyp (benign lymphoid), large hemorrhoids,  repeat 5-10 years   Temporal arteritis (HCC)    Type 2 diabetes mellitus (HCC)    Wears glasses    Past Surgical History:  Procedure Laterality Date   A/V SHUNT INTERVENTION Left 04/16/2024   Procedure: A/V SHUNT INTERVENTION;  Surgeon: Pearline Norman RAMAN, MD;  Location: [facility];  Service: Cardiovascular;  Laterality: Left;   ABDOMINAL HYSTERECTOMY     APPENDECTOMY     ARTERY BIOPSY N/A 05/09/2018   Procedure: RIGHT TEMPORAL ARTERY BIOPSY;  Surgeon: Kimble Agent, MD;  Location: [facility] OR;  Service: General;  Laterality: N/A;   BIOPSY  10/04/2023   Procedure: BIOPSY;  Surgeon: Shaaron Lamar HERO, MD;  Location: [facility];  Service: Endoscopy;;   BREAST BIOPSY Right 06/15/2020   Procedure: RIGHT BREAST BIOPSY;  Surgeon: Mavis Anes, MD;  Location: [facility];  Service: General;  Laterality: Right;   CATARACT EXTRACTION W/PHACO Left 02/09/2017   Procedure: CATARACT EXTRACTION PHACO AND INTRAOCULAR LENS PLACEMENT LEFT EYE;  Surgeon: Perley Hamilton, MD;  Location: [facility];  Service: Ophthalmology;  Laterality: Left;  CDE: 4.89   CATARACT EXTRACTION W/PHACO Right 06/04/2017   Procedure: CATARACT EXTRACTION PHACO AND INTRAOCULAR LENS PLACEMENT (IOC);  Surgeon: Perley Hamilton, MD;  Location: [facility];  Service: Ophthalmology;  Laterality: Right;  CDE: 4.12  CHOLECYSTECTOMY  09/29/2011   Procedure: LAPAROSCOPIC CHOLECYSTECTOMY;  Surgeon: Oneil DELENA Budge, MD;  Location: [facility];  Service: General;  Laterality: N/A;   COLONOSCOPY  08/2009   Dr. Rollin: sessile polyp (benign lymphoid), large hemorrhoids, repeat 5-10 years   COLONOSCOPY N/A 06/12/2016   prominent hemorrhoids   COLONOSCOPY WITH PROPOFOL  N/A 06/16/2021   Procedure: COLONOSCOPY WITH PROPOFOL ;  Surgeon: Shaaron Lamar HERO, MD;  [facility];  Service: Endoscopy;  Laterality: N/A;  9:30am (dialysis pt)   ESOPHAGOGASTRODUODENOSCOPY  09/05/2011   MFM:Dfjoo hiatal hernia; remainder of exam normal. No explanation for patient's abdominal pain with  today's examination   ESOPHAGOGASTRODUODENOSCOPY N/A 12/17/2013   Dr. Shaaron: gastric erythema, erosion, mild chronic inflammation on path    ESOPHAGOGASTRODUODENOSCOPY N/A 12/31/2023   Procedure: EGD (ESOPHAGOGASTRODUODENOSCOPY);  Surgeon: Wilhelmenia Aloha Raddle., MD;  Location: [facility];  Service: Gastroenterology;  Laterality: N/A;   ESOPHAGOGASTRODUODENOSCOPY (EGD) WITH PROPOFOL  N/A 06/16/2021   Procedure: ESOPHAGOGASTRODUODENOSCOPY (EGD) WITH PROPOFOL ;  Surgeon: Shaaron Lamar HERO, MD;  [facility];  Service: Endoscopy;  Laterality: N/A;   ESOPHAGOGASTRODUODENOSCOPY (EGD) WITH PROPOFOL  N/A 10/04/2023   Procedure: ESOPHAGOGASTRODUODENOSCOPY (EGD) WITH PROPOFOL ;  Surgeon: Shaaron Lamar HERO, MD;  [facility];  Service: Endoscopy;  Laterality: N/A;  200pm, ok rm 1-2, pt knows to arrive at 6:45   EUS N/A 12/31/2023   Procedure: ULTRASOUND, UPPER GI TRACT, ENDOSCOPIC;  Surgeon: Wilhelmenia Aloha Raddle., MD;  Location: [facility];  Service: Gastroenterology;  Laterality: N/A;   EXCISION OF BREAST BIOPSY Right 10/12/2020   Procedure: EXCISION OF RIGHT BREAST BIOPSY;  Surgeon: Budge Oneil, MD;  Location: [facility];  Service: General;  Laterality: Right;   IR DIALY SHUNT INTRO NEEDLE/INTRACATH INITIAL W/IMG LEFT Left 07/17/2023   LAPAROSCOPIC APPENDECTOMY  09/29/2011   Procedure: APPENDECTOMY LAPAROSCOPIC;  Surgeon: Oneil DELENA Budge, MD;  Location: [facility];  Service: General;;  incidental appendectomy   LEFT HEART CATH AND CORONARY ANGIOGRAPHY N/A 02/28/2019   Procedure: LEFT HEART CATH AND CORONARY ANGIOGRAPHY;  Surgeon: Dann Candyce RAMAN, MD;  Location: [facility];  Service: Cardiovascular;  Laterality: N/A;   MASS EXCISION Right 01/18/2023   Procedure: EXCISION MASS, RIGHT CHEST S/P MASTECTOMY;  Surgeon: Budge Oneil, MD;  Location: [facility];  Service: General;  Laterality: Right;   MASTECTOMY MODIFIED RADICAL Right 02/18/2020   Procedure: MASTECTOMY MODIFIED RADICAL;   Surgeon: Budge Oneil, MD;  Location: [facility];  Service: General;  Laterality: Right;   MASTECTOMY, PARTIAL Right 07/13/2020   Procedure: RIGHT PARTIAL MASTECTOMY;  Surgeon: Budge Oneil, MD;  Location: [facility];  Service: General;  Laterality: Right;   PARTIAL MASTECTOMY WITH NEEDLE LOCALIZATION AND AXILLARY SENTINEL LYMPH NODE BX Right 09/18/2018   Procedure: RIGHT PARTIAL MASTECTOMY AFTER NEEDLE LOCALIZATION, SENTINEL LYMPH NODE BIOPSY RIGHT AXILLA;  Surgeon: Budge Oneil, MD;  Location: [facility];  Service: General;  Laterality: Right;   POLYPECTOMY  06/16/2021   Procedure: POLYPECTOMY;  Surgeon: Shaaron Lamar HERO, MD;  [facility];  Service: Endoscopy;;   PORT-A-CATH REMOVAL Right 11/29/2023   Procedure: REMOVAL PORT-A-CATH;  Surgeon: Budge Oneil, MD;  Location: [facility];  Service: General;  Laterality: Right;  MINOR PROCEDURE ROOM   PORTACATH PLACEMENT Right 06/07/2023   Procedure: INSERTION PORT-A-CATH, RIGHT  (DIALYSIS ACCESS ON LEFT);  Surgeon: Budge Oneil, MD;  Location: [facility];  Service: General;  Laterality: Right;   PORTACATH PLACEMENT Right 04/03/2024   Procedure: INSERTION, TUNNELED CENTRAL VENOUS DEVICE, WITH PORT;  Surgeon: Budge Oneil,  MD;  Location: [facility];  Service: General;  Laterality: Right;   SIMPLE MASTECTOMY WITH AXILLARY SENTINEL NODE BIOPSY Left 06/15/2020   Procedure: LEFT SIMPLE MASTECTOMY;  Surgeon: Mavis Anes, MD;  Location: [facility];  Service: General;  Laterality: Left;   Current Outpatient Medications on File Prior to Visit  Medication Sig Dispense Refill   acetaminophen  (TYLENOL ) 500 MG tablet Take 1,000 mg by mouth every 8 (eight) hours as needed for moderate pain (pain score 4-6).     albuterol  (PROVENTIL ) (2.5 MG/3ML) 0.083% nebulizer solution Take 3 mLs (2.5 mg total) by nebulization every 4 (four) hours as needed for wheezing or shortness of breath. 75 mL 2   albuterol  (VENTOLIN  HFA) 108 (90 Base) MCG/ACT inhaler Inhale 2 puffs into the lungs  every 6 (six) hours as needed for wheezing or shortness of breath. 8 g 2   aspirin  EC 81 MG tablet Take 81 mg by mouth daily.     B Complex-C-Zn-Folic Acid  (DIALYVITE  800-ZINC  15) 0.8 MG TABS Take 1 tablet by mouth daily.     cinacalcet  (SENSIPAR ) 90 MG tablet Take 90 mg by mouth daily.     diphenoxylate -atropine  (LOMOTIL ) 2.5-0.025 MG tablet Take 1 tablet by mouth 4 (four) times daily as needed for diarrhea or loose stools. 120 tablet 0   fluconazole  (DIFLUCAN ) 100 MG tablet Take 1 tablet (100 mg total) by mouth every other day. 7 tablet 0   gabapentin  (NEURONTIN ) 300 MG capsule TAKE 1 CAPSULE(300 MG) BY MOUTH AT BEDTIME 90 capsule 0   guaiFENesin -codeine  100-10 MG/5ML syrup Take 5 mLs by mouth every 12 (twelve) hours as needed for cough. 120 mL 0   hydrocortisone  (ANUSOL -HC) 2.5 % rectal cream Place rectally 4 (four) times daily. 30 g 0   lactobacillus (FLORANEX/LACTINEX) PACK Take 1 packet (1 g total) by mouth 3 (three) times daily with meals. 90 packet 0   lanthanum  (FOSRENOL ) 1000 MG chewable tablet Chew 2,000-3,000 mg by mouth See admin instructions. Take 3 tablets (3000 mg) by mouth with meals and take 2 tablets (2000 mg) with snacks     lidocaine  (XYLOCAINE ) 2 % solution Use as directed 15 mLs in the mouth or throat every 4 (four) hours as needed for mouth pain (Mix 1:1 with Maalox and swish and spit). 100 mL 1   lidocaine -prilocaine  (EMLA ) cream Apply to affected area once 30 g 3   loperamide  (IMODIUM  A-D) 2 MG tablet Take 2 mg by mouth 4 (four) times daily as needed for diarrhea or loose stools.     loperamide  (IMODIUM ) 2 MG capsule Take 2 tabs by mouth with first loose stool, then 1 tab with each additional loose stool as needed. Do not exceed 8 tabs in a 24-hour period 60 capsule 3   midodrine  (PROAMATINE ) 2.5 MG tablet Take 1 tablet (2.5 mg total) by mouth 3 (three) times daily with meals. 90 tablet 0   Nebulizers (COMPRESSOR/NEBULIZER) MISC 1 Units by Does not apply route daily as  needed. 1 each 0   nystatin (MYCOSTATIN) 100000 UNIT/ML suspension Take 5 mLs (500,000 Units total) by mouth 4 (four) times daily. 60 mL 0   ondansetron  (ZOFRAN ) 8 MG tablet Take 1 tablet (8 mg total) by mouth every 8 (eight) hours as needed for nausea or vomiting. Start on the third day after chemotherapy. 30 tablet 1   pantoprazole  (PROTONIX ) 40 MG tablet Take 1 tablet (40 mg total) by mouth 2 (two) times daily before a meal. 60 tablet 11   phenol (  CHLORASEPTIC) 1.4 % LIQD Use as directed 1 spray in the mouth or throat as needed for throat irritation / pain. 118 mL 0   chlorpheniramine-HYDROcodone  (TUSSIONEX) 10-8 MG/5ML Take 5 mLs by mouth every 12 (twelve) hours as needed (Severe coughing spells.). (Patient not taking: Reported on 06/20/2024) 230 mL 0   [Paused] prochlorperazine  (COMPAZINE ) 10 MG tablet Take 1 tablet (10 mg total) by mouth every 6 (six) hours as needed for nausea or vomiting. (Patient not taking: Reported on 06/20/2024) 30 tablet 1   No current facility-administered medications on file prior to visit.   Allergies  Allergen Reactions   Motrin [Ibuprofen] Other (See Comments)    End stage renal disease    Trodelvy [Sacituzumab Govitecan-Hziy] Other (See Comments)    Wheezing    Social History   Socioeconomic History   Marital status: Married    Spouse name: Not on file   Number of children: Not on file   Years of education: Not on file   Highest education level: 12th grade  Occupational History   Occupation: Systems Developer: [employer]  Tobacco Use   Smoking status: Never    Passive exposure: Never   Smokeless tobacco: Never  Vaping Use   Vaping status: Never Used  Substance and Sexual Activity   Alcohol use: No   Drug use: No   Sexual activity: Yes    Birth control/protection: Surgical  Other Topics Concern   Not on file  Social History Narrative   Works at [employer] in [place].    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   Also  has to get items from high shelves-causes achy pain in shoulder area      Married.   Children are grown, out of house.   Social Drivers of Corporate Investment Banker Strain: Low Risk  (09/20/2023)   Overall Financial Resource Strain (CARDIA)    Difficulty of Paying Living Expenses: Not hard at all  Food Insecurity: No Food Insecurity (06/12/2024)   Hunger Vital Sign    Worried About Running Out of Food in the Last Year: Never true    Ran Out of Food in the Last Year: Never true  Transportation Needs: No Transportation Needs (06/12/2024)   PRAPARE - Administrator, Civil Service (Medical): No    Lack of Transportation (Non-Medical): No  Physical Activity: Inactive (09/20/2023)   Exercise Vital Sign    Days of Exercise per Week: 0 days    Minutes of Exercise per Session: 0 min  Stress: No Stress Concern Present (09/20/2023)   [employer] of Occupational Health - Occupational Stress Questionnaire    Feeling of Stress : Not at all  Social Connections: Unknown (06/08/2024)   Social Connection and Isolation Panel    Frequency of Communication with Friends and Family: Three times a week    Frequency of Social Gatherings with Friends and Family: Patient declined    Attends Religious Services: Patient declined    Database Administrator or Organizations: Patient declined    Attends Banker Meetings: Patient declined    Marital Status: Married  Catering Manager Violence: Not At Risk (06/12/2024)   Humiliation, Afraid, Rape, and Kick questionnaire    Fear of Current or Ex-Partner: No    Emotionally Abused: No    Physically Abused: No    Sexually Abused: No    Review of Systems  All other systems reviewed  and are negative.      Objective:   Physical Exam Vitals reviewed.  Constitutional:      General: She is not in acute distress.    Appearance: Normal appearance. She is normal weight.  Cardiovascular:     Rate and Rhythm: Normal rate and regular  rhythm.     Pulses: Normal pulses.     Heart sounds: Murmur (thrill from AV fistual heard as murmur) heard.  Pulmonary:     Effort: Pulmonary effort is normal. No respiratory distress.     Breath sounds: Normal breath sounds. No stridor. No wheezing, rhonchi or rales.  Chest:     Chest wall: No tenderness.  Musculoskeletal:     Right lower leg: No edema.     Left lower leg: No edema.     Right foot: Normal range of motion. No deformity.  Feet:     Right foot:     Skin integrity: No ulcer, blister, skin breakdown, erythema, warmth or callus.     Left foot:     Skin integrity: No ulcer, blister, skin breakdown, erythema, warmth or callus.  Neurological:     Mental Status: She is alert.   Left arm is swollen with lymphedema to the axilla.        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  She wants to continue dialysis at present.  I explained to the patient what to expect if she were to stop dialysis.  I explained to the patient that she will become progressively more lethargic and sleepy.  As the potassium and other toxins accumulated in her system, her heart would eventually stop.  This would likely be painless but likely occur within a couple of weeks of discontinuation of dialysis.  The patient is not ready for that yet.  She wants to talk to oncology about her treatment options.  I will refill the patient's fentanyl  today.  Otherwise, I would be happy to do anything I can to help with patient with regards to symptom management.  However today she looks quite well and denies any other symptoms at present

## 2024-06-23 DIAGNOSIS — N186 End stage renal disease: Secondary | ICD-10-CM | POA: Diagnosis not present

## 2024-06-23 DIAGNOSIS — D631 Anemia in chronic kidney disease: Secondary | ICD-10-CM | POA: Diagnosis not present

## 2024-06-23 DIAGNOSIS — N2581 Secondary hyperparathyroidism of renal origin: Secondary | ICD-10-CM | POA: Diagnosis not present

## 2024-06-23 DIAGNOSIS — Z992 Dependence on renal dialysis: Secondary | ICD-10-CM | POA: Diagnosis not present

## 2024-06-23 DIAGNOSIS — D509 Iron deficiency anemia, unspecified: Secondary | ICD-10-CM | POA: Diagnosis not present

## 2024-06-24 ENCOUNTER — Inpatient Hospital Stay

## 2024-06-24 ENCOUNTER — Ambulatory Visit (HOSPITAL_COMMUNITY)
Admission: RE | Admit: 2024-06-24 | Discharge: 2024-06-24 | Disposition: A | Discharge status: Home / Self Care | Source: Ambulatory Visit | Attending: Family Medicine | Admitting: Family Medicine

## 2024-06-24 ENCOUNTER — Inpatient Hospital Stay: Attending: Hematology | Admitting: Oncology

## 2024-06-24 ENCOUNTER — Ambulatory Visit: Admitting: Gastroenterology

## 2024-06-24 VITALS — BP 120/65 | HR 89 | Temp 97.6°F | Resp 18 | Ht 62.0 in | Wt 162.0 lb

## 2024-06-24 DIAGNOSIS — T451X5S Adverse effect of antineoplastic and immunosuppressive drugs, sequela: Secondary | ICD-10-CM | POA: Insufficient documentation

## 2024-06-24 DIAGNOSIS — Z992 Dependence on renal dialysis: Secondary | ICD-10-CM | POA: Diagnosis not present

## 2024-06-24 DIAGNOSIS — C787 Secondary malignant neoplasm of liver and intrahepatic bile duct: Secondary | ICD-10-CM | POA: Diagnosis not present

## 2024-06-24 DIAGNOSIS — C50911 Malignant neoplasm of unspecified site of right female breast: Secondary | ICD-10-CM | POA: Diagnosis not present

## 2024-06-24 DIAGNOSIS — Z170 Estrogen receptor positive status [ER+]: Secondary | ICD-10-CM

## 2024-06-24 DIAGNOSIS — Z79899 Other long term (current) drug therapy: Secondary | ICD-10-CM | POA: Insufficient documentation

## 2024-06-24 DIAGNOSIS — G6289 Other specified polyneuropathies: Secondary | ICD-10-CM | POA: Diagnosis not present

## 2024-06-24 DIAGNOSIS — N186 End stage renal disease: Secondary | ICD-10-CM | POA: Diagnosis not present

## 2024-06-24 DIAGNOSIS — C50211 Malignant neoplasm of upper-inner quadrant of right female breast: Secondary | ICD-10-CM

## 2024-06-24 DIAGNOSIS — Z5112 Encounter for antineoplastic immunotherapy: Secondary | ICD-10-CM | POA: Insufficient documentation

## 2024-06-24 DIAGNOSIS — Z1732 Human epidermal growth factor receptor 2 negative status: Secondary | ICD-10-CM | POA: Diagnosis not present

## 2024-06-24 DIAGNOSIS — Z9011 Acquired absence of right breast and nipple: Secondary | ICD-10-CM | POA: Diagnosis not present

## 2024-06-24 DIAGNOSIS — D649 Anemia, unspecified: Secondary | ICD-10-CM | POA: Insufficient documentation

## 2024-06-24 DIAGNOSIS — Z7982 Long term (current) use of aspirin: Secondary | ICD-10-CM | POA: Insufficient documentation

## 2024-06-24 DIAGNOSIS — I10 Essential (primary) hypertension: Secondary | ICD-10-CM

## 2024-06-24 DIAGNOSIS — R053 Chronic cough: Secondary | ICD-10-CM | POA: Diagnosis not present

## 2024-06-24 DIAGNOSIS — R197 Diarrhea, unspecified: Secondary | ICD-10-CM | POA: Diagnosis not present

## 2024-06-24 DIAGNOSIS — G629 Polyneuropathy, unspecified: Secondary | ICD-10-CM | POA: Diagnosis not present

## 2024-06-24 DIAGNOSIS — Z923 Personal history of irradiation: Secondary | ICD-10-CM | POA: Diagnosis not present

## 2024-06-24 DIAGNOSIS — Z1721 Progesterone receptor positive status: Secondary | ICD-10-CM | POA: Insufficient documentation

## 2024-06-24 DIAGNOSIS — Z171 Estrogen receptor negative status [ER-]: Secondary | ICD-10-CM | POA: Diagnosis not present

## 2024-06-24 DIAGNOSIS — C7800 Secondary malignant neoplasm of unspecified lung: Secondary | ICD-10-CM | POA: Diagnosis not present

## 2024-06-24 LAB — CBC WITH DIFFERENTIAL/PLATELET
Abs Immature Granulocytes: 0.26 K/uL — ABNORMAL HIGH (ref 0.00–0.07)
Basophils Absolute: 0.1 K/uL (ref 0.0–0.1)
Basophils Relative: 1 %
Eosinophils Absolute: 0 K/uL (ref 0.0–0.5)
Eosinophils Relative: 0 %
HCT: 26.3 % — ABNORMAL LOW (ref 36.0–46.0)
Hemoglobin: 8 g/dL — ABNORMAL LOW (ref 12.0–15.0)
Immature Granulocytes: 4 %
Lymphocytes Relative: 16 %
Lymphs Abs: 1.2 K/uL (ref 0.7–4.0)
MCH: 34.3 pg — ABNORMAL HIGH (ref 26.0–34.0)
MCHC: 30.4 g/dL (ref 30.0–36.0)
MCV: 112.9 fL — ABNORMAL HIGH (ref 80.0–100.0)
Monocytes Absolute: 1.1 K/uL — ABNORMAL HIGH (ref 0.1–1.0)
Monocytes Relative: 15 %
Neutro Abs: 4.7 K/uL (ref 1.7–7.7)
Neutrophils Relative %: 64 %
Platelets: 281 K/uL (ref 150–400)
RBC: 2.33 MIL/uL — ABNORMAL LOW (ref 3.87–5.11)
RDW: 18.8 % — ABNORMAL HIGH (ref 11.5–15.5)
WBC: 7.3 K/uL (ref 4.0–10.5)
nRBC: 0.4 % — ABNORMAL HIGH (ref 0.0–0.2)

## 2024-06-24 LAB — COMPREHENSIVE METABOLIC PANEL WITH GFR
ALT: 22 U/L (ref 0–44)
AST: 25 U/L (ref 15–41)
Albumin: 3.1 g/dL — ABNORMAL LOW (ref 3.5–5.0)
Alkaline Phosphatase: 163 U/L — ABNORMAL HIGH (ref 38–126)
Anion gap: 11 (ref 5–15)
BUN: 15 mg/dL (ref 8–23)
CO2: 31 mmol/L (ref 22–32)
Calcium: 7.6 mg/dL — ABNORMAL LOW (ref 8.9–10.3)
Chloride: 97 mmol/L — ABNORMAL LOW (ref 98–111)
Creatinine, Ser: 7.8 mg/dL — ABNORMAL HIGH (ref 0.44–1.00)
GFR, Estimated: 5 mL/min — ABNORMAL LOW (ref >60)
Glucose, Bld: 87 mg/dL (ref 70–99)
Potassium: 3.8 mmol/L (ref 3.5–5.1)
Sodium: 139 mmol/L (ref 135–145)
Total Bilirubin: 0.3 mg/dL (ref 0.0–1.2)
Total Protein: 6 g/dL — ABNORMAL LOW (ref 6.5–8.1)

## 2024-06-24 LAB — ECHOCARDIOGRAM COMPLETE
Area-P 1/2: 4.49 cm2
Calc EF: 63.4 %
S' Lateral: 2.8 cm
Single Plane A2C EF: 62 %
Single Plane A4C EF: 62.7 %

## 2024-06-24 NOTE — Patient Instructions (Signed)
 [facility] at [facility] Discharge Instructions   You were seen and examined today by Dr. Davonna.  She discussed restarting treatment with the medication Wilfred) you were receiving previously. She will reduce the dose in hopes that you tolerate the medication better. The first opening we have for treatment that will work for you is November 18th (Tuesday).    Return as scheduled.    Thank you for choosing  Cancer Center at [facility] to provide your oncology and hematology care.  To afford each patient quality time with our provider, please arrive at least 15 minutes before your scheduled appointment time.   If you have a lab appointment with the Cancer Center please come in thru the Main Entrance and check in at the main information desk.  You need to re-schedule your appointment should you arrive 10 or more minutes late.  We strive to give you quality time with our providers, and arriving late affects you and other patients whose appointments are after yours.  Also, if you no show three or more times for appointments you may be dismissed from the clinic at the providers discretion.     Again, thank you for choosing [facility].  Our hope is that these requests will decrease the amount of time that you wait before being seen by our physicians.       _____________________________________________________________  Should you have questions after your visit to [facility], please contact our office at [phone] and follow the prompts.  Our office hours are 8:00 a.m. and 4:30 p.m. Monday - Friday.  Please note that voicemails left after 4:00 p.m. may not be returned until the following business day.  We are closed weekends and major holidays.  You do have access to a nurse 24-7, just call the main number to the clinic [phone] and do not press any options, hold on the line and a nurse will answer the phone.    For  prescription refill requests, have your pharmacy contact our office and allow 72 hours.    Due to Covid, you will need to wear a mask upon entering the hospital. If you do not have a mask, a mask will be given to you at the Main Entrance upon arrival. For doctor visits, patients may have 1 support person age 62 or older with them. For treatment visits, patients can not have anyone with them due to social distancing guidelines and our immunocompromised population.

## 2024-06-24 NOTE — Progress Notes (Signed)
 Patient Care Team: Duanne Butler DASEN, MD as PCP - General (Family Medicine) Alvan, Dorn FALCON, MD as PCP - Cardiology (Cardiology) Shaaron Lamar HERO, MD (Gastroenterology) Debera Jayson MATSU, MD as Consulting Physician (Cardiology) Rayburn Pac, MD as Consulting Physician (Nephrology)  Clinic Day:  06/24/2024  Referring physician: Duanne Butler DASEN, MD   CHIEF COMPLAINT:  CC: Stage Ib (T1CN0) right breast cancer, ER positive, PR and HER-2 negative    ASSESSMENT & PLAN:   Assessment & Plan: Megan [name]  is a 63 y.o. 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  Diagnosed in 2019. S/p right breast lumpectomy, Oncotype DX score of 47.  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  Also had prophylactic left simple mastectomy. First recurrence: 07/13/2020.  Local recurrence, s/p right breast lumpectomy. Second occurrence: 10/12/2020.  Local recurrence.  S/p right mastectomy and radiation therapy.Patient decided against chemotherapy considering poor tolerance with the first time and bad peripheral neuropathy. Third recurrence: 11/30/2022: Within the right medial pectoralis musculature-local recurrence.  S/p right chest wall mass resection consistent with invasive ductal carcinoma.  Triple negative. S/p RT.  6 cycles of carboplatin /docetaxel  and pembrolizumab . PET scan in 01/2024 showed abnormal lymph nodes in the left axillary region.  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 Triple negative.  Germline mutation analysis: Negative.  [facility] NGS: PD-L1: 10, no other targetable mutations Started on capecitabine  and pembrolizumab .  Patient completed 1 cycle and was admitted to the hospital for  shortness of breath.  CT scan at this time showed progression of disease with lung metastasis and liver metastasis.   - Patient had severe complications with significant diarrhea after previous dose of sacituzumab govitecan  and was admitted to the hospital.  Patient transition to home with hospice during this time and decided to restart her treatment. - Patient appears to be significantly improved since discharge and is currently not requiring any pressors or midodrine  for dialysis - Discussed restarting sacituzumab govitecan  at a low dose of 5 mg/kg every 3 weeks.  Patient requires growth factor support with treatment - Will repeat a PET scan after completion of 3 cycles of chemotherapy.  Return to clinic prior to next cycle to assess for tolerance.  Diarrhea Patient reports significant improvement in diarrhea since hospital discharge.  Discussed that this is likely secondary to sacituzumab govitecan  and to be really vigilant with the preventive treatment.  - Discussed using Imodium  after every episode of diarrhea up to 5 times a day and using Lomotil  as needed up to 4 times a day.  Cough due to lung metastases Persistent cough likely from lung metastases. No antibiotics needed at this time.  Improved at this time.  - Continue Tessalon  perles for cough management. - Provideded refills for Tessalon  perles as needed. - Continue oxygen  support  Peripheral neuropathy Existing peripheral neuropathy may worsen with new treatment.    -Recommended to report any symptom changes. - Monitor for worsening neuropathy symptoms. - Instructed her to report any increase in numbness or tingling.  Anemia Anemia with low hemoglobin.  Likely multifactorial from ESRD,myelosuppression.  Anemia panel with elevated ferritin last time  - Monitor hemoglobin levels closely.  End-stage renal disease on dialysis End-stage renal disease managed with dialysis.   - Continue  regular dialysis sessions. - Monitor  kidney function closely.  Goals of care discussion Patient at this time wishes to be full code and continue with her treatment.    The patient understands the plans discussed today and is in agreement with them.  She knows to contact our office if she develops concerns prior to her next appointment.  The total time spent in the appointment was 30 minutes for the encounter  with patient, including review of chart and various tests results, discussions about plan of care and coordination of care plan   Mickiel Dry, MD  [facility] [place] - A DEPT OF [name] [address] [place] [address] Dept: [phone] Dept Fax: [phone]   No orders of the defined types were placed in this encounter.    ONCOLOGY HISTORY:   I have reviewed her chart and materials related to her cancer extensively and collaborated history with the patient. Summary of oncologic history is as follows:   Diagnosis: Stage Ib (T1CN0) right breast cancer, ER positive, PR and HER-2 negative   -02/18/2018: Screening Mammogram: possible mass in the right breast. -02/26/2018: Diagnostic Mammogram and US  of right breast: Several small 4-5 mm cysts, with a bilobed cyst at the 1 o'clock position 6 cm from nipple measuring 0.5 x 0.3 x 0.4 cm. Irregular shadowing mass in the right breast at the 12:30 position 6 cm from nipple measuring 0.7 x 0.8 x 0.6 cm. -03/05/2018: Right breast mass biopsy.  Pathology: Benign breast tissue with areas of dense hyaline fibrosis. No evidence of malignancy.  -08/20/2018: Diagnostic Right breast Mammogram: Suspicious mass in the 1 o'clock location of the RIGHT breast.  -08/27/2018: Right breast mass biopsy.  Pathology: Invasive ductal carcinoma. Based on the biopsy, the carcinoma appears Nottingham grade 3 of 3 and measures 0.5 cm in greatest linear extent. The tumor cells are ER positive (50%), PR negative (0%), and Her2  negative (2+). Ki67 60%. -09/18/2018: Right lumpectomy and sentinel lymph node biopsy.  Pathology: Invasive ductal carcinoma, grade 3, spanning 1.5 cm. High grade ductal carcinoma in situ with necrosis. Invasive carcinoma comes to within 0.1-0.2 cm of the anterior margin focally. Three of three lymph nodes negative for metastatic carcinoma.  Oncotype DX: 47 -10/30/2018 to 01/01/2019: 4 cycles of adjuvant AC -01/2019- 10/2023: Anastrozole  -09/02/2019: Mammogram: 8 mm group of indeterminate calcifications in the 3 o'clock position of the right breast. -09/12/2019: Right breast biopsy.  Pathology: DCIS, high nuclear grade with central necrosis and calcifications.  -02/18/2020: Right partial mastectomy. Pathology: Ductal carcinoma in situ, high-grade, with central necrosis and calcifications. No evidence of invasive carcinoma. Resection margins are negative for DCIS. Eleven lymph nodes, negative for carcinoma (0/11). -06/15/2020: Prophylactic Left simple mastectomy with right breast bisopy.  Pathology of left breast: Benign breast parenchyma with mild fibrocystic change.Negative for carcinoma. Pathology of right breast biopsy: Microscopic focus of invasive ductal carcinoma in a background of extensive high-grade ductal carcinoma in situ with necrosis and rare calcifications measuring 0.1 cm in greatest linear dimension.  -07/13/2020: Right breast lumpectomy.  Pathology: Multifocal invasive ductal carcinoma, grade 3, largest spanning 1.2 cm. Extensive high grade ductal carcinoma in situ with necrosis. Invasive carcinoma is 1-2 mm from the medial margin focally and 2 mm from the lateral margin focally.  ER/PR/HER-2 is negative.  Ki-67 is 20%.  -09/14/2020: PET: Areas of nodularity about the RIGHT breast in this patient who is undergone previous lumpectomy x2 and RIGHT mastectomy.  Most suspicious area is associated with discrete nodule in the fat of the inferior chest wall within the subcutaneous tissues.  Scattered small areas along the pectoralis musculature tracking laterally largest on image 56 of series 4 also associated with low level uptake. Small pulmonary nodules without interval change. -09/28/2020: US  of right chest wall: Mass-like abnormality in the upper inner aspect of the right anterior chest, designated at 1 o'clock, 7 cm from the central aspect of the mastectomy scar. This measures 4.1 cm in greatest dimension. 1.7 cm hypoechoic mass the central anterior right chest deep to the mid aspect of the mastectomy scar. Possible borderline enlarged residual right axillary lymph node.  -10/12/2020: Right breast biopsy.  Pathology: Invasive and in situ ductal carcinoma, 1.3 x 1.1 x 0.8 cm. Invasive carcinoma focally 0.1 cm from superior margin. DCIS less than 0.1 cm from superior margin.  -10/28/2020: [facility] results: Negative. -12/2020: Right mastectomy followed by radiation therapy completed. Patient decided against chemotherapy considering poor tolerance with the first time and bad peripheral neuropathy. -11/30/2022: PET: Recurrent/metastatic disease within the right medial pectoralis musculature in this patient who is status post bilateral mastectomy. A right suprarenal 8 mm nodule is likely arising from the adrenal gland. -12/19/2022: Right chest wall mass biopsy.  Pathology: Metastatic adenocarcinoma. Tumor cells are ER positive (40%), PR negative (0%), and Her2 negative (0). Ki-67 30%. -01/18/2023: Right chest wall mass resection with right breast biopsy. -Pathology of right chest wall: Invasive ductal carcinoma (clinically recurrent), 1.7 cm, grade 3. Ductal carcinoma in situ, high-grade with necrosis. Carcinoma involves the inked inferior margin. Carcinoma focally invades into atrophic skeletal muscle. -Pathology of right breast biopsy: Grade 3 Invasive ductal carcinoma. Lymphovascular invasion not identified. Tumor cells ER negative (0%), PR negative (0%), and Her negative (0). Ki-67  35%. -04/18/2023: Patient's case discussed at multidisciplinary clinic at [facility]. Surgical oncology recommended radiation therapy. Patient also evaluated by medical oncology who recommended systemic therapy with 6 cycles of docetaxel , carboplatin  and pembrolizumab .  -05/07/2023 to 05/25/2023: XRT -06/14/2023 to 09/27/2023:  6 cycles of carboplatin , docetaxel  and pembrolizumab   -01/31/2024: PET: multiple abnormal lymph nodes identified in the left axillary region and chest wall which are hypermetabolic there is also a node in the supraclavicular region on the left. These are concerning for worsening metastatic disease. -02/26/2024: Left axillary lymph node biopsy.  -Pathology: Metastatic carcinoma involving a lymph node, compatible with patient's clinical history of primary breast carcinoma. All prognostic markers negative (0). Ki-67 20%.  -[facility] NGS: PDL1: Positive, CPS: 10, ER/PR/HER2/NEU: Triple negative, HER2: Negative, MSI-stable, BRAF, NTRK 1/2/3, RET: Negative, genomic LOH: Low, TMB: Low 11mut/Mb, ESR 1: Not detected, PALB2: Not detected PIK3CA: Not detected -03/03/2024: CT AP w contrast: New right lower lobe nodules worrisome for metastatic disease. -03/12/2024: BRCA1/2 analysis: Germline mutation analysis: NTHL1- Variant of unknown significance -03/26/2024: Patient was seen by Dr. [name] coons oncologist] at [facility], who recommended systemic treatment with pembrolizumab  and significantly dose-reduced capecitabine  (500 mg BID). If disease progression occurs, next line of treatment may be dose-adjusted sacituzumab govitecan  + pembrolizumab  (Considering poor data for either in patients with ESRD  -04/08/2024: CEA 15-3: 67, CA 27-29: 98.7 -04/17/2024-05/08/2024:  Capecitabine (dose reduced ) + pembrolizumab  -05/22/2024: PET Scan: Marked progression of metastatic breast cancer as evidenced by widespread pulmonary metastatic disease, progressive low neck, chest and upper abdominal  adenopathy, new liver metastasis and new intercostal metastasis. - 05/29/2024-current: Sacituzumab govitecan  every 3 weeks. 2nd cycle delayed due to hospitalization and also had severe diarrhea during  this period.   Current Treatment:  Sacituzumab govitecan  every 3 weeks.  INTERVAL HISTORY:   Discussed the use of AI scribe software for clinical note transcription with the patient, who gave verbal consent to proceed.  History of Present Illness Megan [name] is a 63 year old female who presents for resumption of chemotherapy for her metastatic breast cancer. She is accompanied by her husband and sister today.   Patient was recently in the hospital requiring pressors during dialysis.  She was transferred to home with hospice but then patient decided to get further treatment and came back to clinic today.  She is currently undergoing dialysis and does not require blood pressure support medications during the process. She was previously given midodrine  to manage blood pressure issues during dialysis. She attends dialysis at [facility] and has returned to her regular schedule.  She is eager to resume chemotherapy. She has completed one cycle and is scheduled for two more cycles before the next PET scan. She experienced diarrhea during the last chemotherapy cycle, which has since resolved, and her stool is now firm. She is concerned about her potassium levels, which were low during her last hospital stay, but she is not currently taking potassium supplements. She is taking Lomotil  for diarrhea management.  She reports numbness and tingling, which persist. She also mentions a cough that is present but not as severe as before. She is able to walk with assistance and is using a wheelchair for mobility assistance but can walk to the bathroom with help.      I have reviewed the past medical history, past surgical history, social history and family history with the patient and they are unchanged from  previous note.  ALLERGIES:  is allergic to motrin [ibuprofen] and trodelvy  [sacituzumab govitecan -hziy].  MEDICATIONS:  Current Outpatient Medications  Medication Sig Dispense Refill   acetaminophen  (TYLENOL ) 500 MG tablet Take 1,000 mg by mouth every 8 (eight) hours as needed for moderate pain (pain score 4-6).     albuterol  (PROVENTIL ) (2.5 MG/3ML) 0.083% nebulizer solution Take 3 mLs (2.5 mg total) by nebulization every 4 (four) hours as needed for wheezing or shortness of breath. 75 mL 2   albuterol  (VENTOLIN  HFA) 108 (90 Base) MCG/ACT inhaler Inhale 2 puffs into the lungs every 6 (six) hours as needed for wheezing or shortness of breath. 8 g 2   aspirin  EC 81 MG tablet Take 81 mg by mouth daily.     B Complex-C-Zn-Folic Acid  (DIALYVITE  800-ZINC  15) 0.8 MG TABS Take 1 tablet by mouth daily.     chlorpheniramine-HYDROcodone  (TUSSIONEX) 10-8 MG/5ML Take 5 mLs by mouth every 12 (twelve) hours as needed (Severe coughing spells.). 230 mL 0   cinacalcet  (SENSIPAR ) 90 MG tablet Take 90 mg by mouth daily.     diphenoxylate -atropine  (LOMOTIL ) 2.5-0.025 MG tablet Take 1 tablet by mouth 4 (four) times daily as needed for diarrhea or loose stools. 120 tablet 0   fentaNYL  (DURAGESIC ) 50 MCG/HR Place 1 patch onto the skin every 3 (three) days. 10 patch 0   fluconazole  (DIFLUCAN ) 100 MG tablet Take 1 tablet (100 mg total) by mouth every other day. 7 tablet 0   gabapentin  (NEURONTIN ) 300 MG capsule TAKE 1 CAPSULE(300 MG) BY MOUTH AT BEDTIME 90 capsule 0   guaiFENesin -codeine  100-10 MG/5ML syrup Take 5 mLs by mouth every 12 (twelve) hours as needed for cough. 120 mL 0   hydrocortisone  (ANUSOL -HC) 2.5 % rectal cream Place rectally 4 (four) times daily. 30 g  0   lactobacillus (FLORANEX/LACTINEX) PACK Take 1 packet (1 g total) by mouth 3 (three) times daily with meals. 90 packet 0   lanthanum  (FOSRENOL ) 1000 MG chewable tablet Chew 2,000-3,000 mg by mouth See admin instructions. Take 3 tablets (3000 mg) by  mouth with meals and take 2 tablets (2000 mg) with snacks     lidocaine  (XYLOCAINE ) 2 % solution Use as directed 15 mLs in the mouth or throat every 4 (four) hours as needed for mouth pain (Mix 1:1 with Maalox and swish and spit). 100 mL 1   lidocaine -prilocaine  (EMLA ) cream Apply to affected area once 30 g 3   loperamide  (IMODIUM  A-D) 2 MG tablet Take 2 mg by mouth 4 (four) times daily as needed for diarrhea or loose stools.     loperamide  (IMODIUM ) 2 MG capsule Take 2 tabs by mouth with first loose stool, then 1 tab with each additional loose stool as needed. Do not exceed 8 tabs in a 24-hour period 60 capsule 3   midodrine  (PROAMATINE ) 2.5 MG tablet Take 1 tablet (2.5 mg total) by mouth 3 (three) times daily with meals. 90 tablet 0   Nebulizers (COMPRESSOR/NEBULIZER) MISC 1 Units by Does not apply route daily as needed. 1 each 0   nystatin  (MYCOSTATIN ) 100000 UNIT/ML suspension Take 5 mLs (500,000 Units total) by mouth 4 (four) times daily. 60 mL 0   ondansetron  (ZOFRAN ) 8 MG tablet Take 1 tablet (8 mg total) by mouth every 8 (eight) hours as needed for nausea or vomiting. Start on the third day after chemotherapy. 30 tablet 1   pantoprazole  (PROTONIX ) 40 MG tablet Take 1 tablet (40 mg total) by mouth 2 (two) times daily before a meal. 60 tablet 11   phenol (CHLORASEPTIC) 1.4 % LIQD Use as directed 1 spray in the mouth or throat as needed for throat irritation / pain. 118 mL 0   [Paused] prochlorperazine  (COMPAZINE ) 10 MG tablet Take 1 tablet (10 mg total) by mouth every 6 (six) hours as needed for nausea or vomiting. 30 tablet 1   No current facility-administered medications for this visit.    REVIEW OF SYSTEMS:   Constitutional: Denies fevers, chills or abnormal weight loss Eyes: Denies blurriness of vision Ears, nose, mouth, throat, and face: Denies mucositis or sore throat Respiratory: Denies cough, dyspnea or wheezes Cardiovascular: Denies palpitation, chest discomfort or lower  extremity swelling Gastrointestinal:  Denies nausea, heartburn or change in bowel habits Skin: Denies abnormal skin rashes Lymphatics: Denies new lymphadenopathy or easy bruising Neurological:Denies numbness, tingling or new weaknesses Behavioral/Psych: Mood is stable, no new changes  All other systems were reviewed with the patient and are negative.   VITALS:  Blood pressure 120/65, pulse 89, temperature 97.6 F (36.4 C), temperature source Tympanic, resp. rate 18, height 5' 2 (1.575 m), weight 162 lb (73.5 kg), SpO2 95%.  Wt Readings from Last 3 Encounters:  06/24/24 162 lb (73.5 kg)  06/20/24 165 lb (74.8 kg)  06/13/24 165 lb 5.5 oz (75 kg)    Body mass index is 29.63 kg/m.  Performance status (ECOG): 0 - Asymptomatic  PHYSICAL EXAM:   GENERAL:alert, no distress and comfortable SKIN: skin color, texture, turgor are normal, no rashes or significant lesions HEENT: Oral thrush present on her posterior side of the cheeks EYES: normal, Conjunctiva are pink and non-injected, sclera clear LUNGS: clear to auscultation and percussion with normal breathing effort HEART: regular rate & rhythm and no murmurs and no lower extremity edema ABDOMEN:abdomen soft, non-tender  and normal bowel sounds Musculoskeletal:no cyanosis of digits and no clubbing  NEURO: alert & oriented x 3 with fluent speech   LABORATORY DATA:  I have reviewed the data as listed   Latest Reference Range & Units 06/24/24 16:02  Comprehensive metabolic panel with GFR  Rpt !  Sodium 135 - 145 mmol/L 139  Potassium 3.5 - 5.1 mmol/L 3.8  Chloride 98 - 111 mmol/L 97 (L)  CO2 22 - 32 mmol/L 31  Glucose 70 - 99 mg/dL 87  BUN 8 - 23 mg/dL 15  Creatinine 9.55 - 8.99 mg/dL 2.19 (H)  Calcium  8.9 - 10.3 mg/dL 7.6 (L)  Anion gap 5 - 15  11  Alkaline Phosphatase 38 - 126 U/L 163 (H)  Albumin  3.5 - 5.0 g/dL 3.1 (L)  AST 15 - 41 U/L 25  ALT 0 - 44 U/L 22  Total Protein 6.5 - 8.1 g/dL 6.0 (L)  Total Bilirubin 0.0 - 1.2  mg/dL 0.3  GFR, Estimated >39 mL/min 5 (L)  !: Data is abnormal (L): Data is abnormally low (H): Data is abnormally high Rpt: View report in Results Review for more information   Latest Reference Range & Units 06/24/24 16:02  WBC 4.0 - 10.5 K/uL 7.3  RBC 3.87 - 5.11 MIL/uL 2.33 (L)  Hemoglobin 12.0 - 15.0 g/dL 8.0 (L)  HCT 63.9 - 53.9 % 26.3 (L)  MCV 80.0 - 100.0 fL 112.9 (H)  MCH 26.0 - 34.0 pg 34.3 (H)  MCHC 30.0 - 36.0 g/dL 69.5  RDW 88.4 - 84.4 % 18.8 (H)  Platelets 150 - 400 K/uL 281  nRBC 0.0 - 0.2 % 0.4 (H)  Neutrophils % 64  Lymphocytes % 16  Monocytes Relative % 15  Eosinophil % 0  Basophil % 1  Immature Granulocytes % 4  NEUT# 1.7 - 7.7 K/uL 4.7  Lymphs Abs 0.7 - 4.0 K/uL 1.2  Monocyte # 0.1 - 1.0 K/uL 1.1 (H)  Eosinophils Absolute 0.0 - 0.5 K/uL 0.0  Basophils Absolute 0.0 - 0.1 K/uL 0.1  Abs Immature Granulocytes 0.00 - 0.07 K/uL 0.26 (H)  (L): Data is abnormally low (H): Data is abnormally high  RADIOGRAPHIC STUDIES: I have personally reviewed the radiological images as listed and agreed with the findings in the report.

## 2024-06-24 NOTE — Progress Notes (Signed)
*  PRELIMINARY RESULTS* Echocardiogram 2D Echocardiogram has been performed.  Megan [name] 06/24/2024, 12:32 PM

## 2024-06-25 ENCOUNTER — Other Ambulatory Visit (HOSPITAL_COMMUNITY): Payer: Self-pay

## 2024-06-25 ENCOUNTER — Encounter: Payer: Self-pay | Admitting: Oncology

## 2024-06-25 DIAGNOSIS — N186 End stage renal disease: Secondary | ICD-10-CM | POA: Diagnosis not present

## 2024-06-25 DIAGNOSIS — Z992 Dependence on renal dialysis: Secondary | ICD-10-CM | POA: Diagnosis not present

## 2024-06-25 DIAGNOSIS — D631 Anemia in chronic kidney disease: Secondary | ICD-10-CM | POA: Diagnosis not present

## 2024-06-25 DIAGNOSIS — D509 Iron deficiency anemia, unspecified: Secondary | ICD-10-CM | POA: Diagnosis not present

## 2024-06-25 DIAGNOSIS — N2581 Secondary hyperparathyroidism of renal origin: Secondary | ICD-10-CM | POA: Diagnosis not present

## 2024-06-25 LAB — CANCER ANTIGEN 15-3: CA 15-3: 95.6 U/mL — ABNORMAL HIGH (ref 0.0–25.0)

## 2024-06-25 LAB — CANCER ANTIGEN 27.29: CA 27.29: 126.7 U/mL — ABNORMAL HIGH (ref 0.0–38.6)

## 2024-06-25 NOTE — Progress Notes (Signed)
 Megan [name] presented for Portacath access and flush.  Portacath located right chest wall accessed with  H 20 needle.  Good blood return present. Portacath flushed with 20ml NS and 500U/70ml Heparin  and needle removed intact.  Procedure tolerated well and without incident.

## 2024-06-26 ENCOUNTER — Inpatient Hospital Stay

## 2024-06-27 DIAGNOSIS — Z992 Dependence on renal dialysis: Secondary | ICD-10-CM | POA: Diagnosis not present

## 2024-06-27 DIAGNOSIS — D631 Anemia in chronic kidney disease: Secondary | ICD-10-CM | POA: Diagnosis not present

## 2024-06-27 DIAGNOSIS — N2581 Secondary hyperparathyroidism of renal origin: Secondary | ICD-10-CM | POA: Diagnosis not present

## 2024-06-27 DIAGNOSIS — D509 Iron deficiency anemia, unspecified: Secondary | ICD-10-CM | POA: Diagnosis not present

## 2024-06-27 DIAGNOSIS — N186 End stage renal disease: Secondary | ICD-10-CM | POA: Diagnosis not present

## 2024-06-29 ENCOUNTER — Other Ambulatory Visit: Payer: Self-pay

## 2024-06-29 ENCOUNTER — Emergency Department (HOSPITAL_COMMUNITY)

## 2024-06-29 ENCOUNTER — Emergency Department (HOSPITAL_COMMUNITY)
Admission: EM | Admit: 2024-06-29 | Discharge: 2024-06-29 | Disposition: A | Discharge status: Home / Self Care | Attending: Emergency Medicine | Admitting: Emergency Medicine

## 2024-06-29 DIAGNOSIS — Z7982 Long term (current) use of aspirin: Secondary | ICD-10-CM | POA: Insufficient documentation

## 2024-06-29 DIAGNOSIS — R918 Other nonspecific abnormal finding of lung field: Secondary | ICD-10-CM | POA: Diagnosis not present

## 2024-06-29 DIAGNOSIS — Z992 Dependence on renal dialysis: Secondary | ICD-10-CM | POA: Insufficient documentation

## 2024-06-29 DIAGNOSIS — R0602 Shortness of breath: Secondary | ICD-10-CM | POA: Diagnosis not present

## 2024-06-29 DIAGNOSIS — R002 Palpitations: Secondary | ICD-10-CM | POA: Diagnosis not present

## 2024-06-29 DIAGNOSIS — R197 Diarrhea, unspecified: Secondary | ICD-10-CM | POA: Diagnosis not present

## 2024-06-29 DIAGNOSIS — I120 Hypertensive chronic kidney disease with stage 5 chronic kidney disease or end stage renal disease: Secondary | ICD-10-CM | POA: Diagnosis not present

## 2024-06-29 DIAGNOSIS — Z79899 Other long term (current) drug therapy: Secondary | ICD-10-CM | POA: Diagnosis not present

## 2024-06-29 DIAGNOSIS — R000 Tachycardia, unspecified: Secondary | ICD-10-CM | POA: Insufficient documentation

## 2024-06-29 DIAGNOSIS — Z853 Personal history of malignant neoplasm of breast: Secondary | ICD-10-CM | POA: Insufficient documentation

## 2024-06-29 DIAGNOSIS — N186 End stage renal disease: Secondary | ICD-10-CM | POA: Insufficient documentation

## 2024-06-29 LAB — BASIC METABOLIC PANEL WITH GFR
Anion gap: 13 (ref 5–15)
BUN: 16 mg/dL (ref 8–23)
CO2: 29 mmol/L (ref 22–32)
Calcium: 7.6 mg/dL — ABNORMAL LOW (ref 8.9–10.3)
Chloride: 97 mmol/L — ABNORMAL LOW (ref 98–111)
Creatinine, Ser: 9.15 mg/dL — ABNORMAL HIGH (ref 0.44–1.00)
GFR, Estimated: 4 mL/min — ABNORMAL LOW (ref >60)
Glucose, Bld: 104 mg/dL — ABNORMAL HIGH (ref 70–99)
Potassium: 3.1 mmol/L — ABNORMAL LOW (ref 3.5–5.1)
Sodium: 138 mmol/L (ref 135–145)

## 2024-06-29 LAB — CBC
HCT: 27 % — ABNORMAL LOW (ref 36.0–46.0)
Hemoglobin: 8.1 g/dL — ABNORMAL LOW (ref 12.0–15.0)
MCH: 34 pg (ref 26.0–34.0)
MCHC: 30 g/dL (ref 30.0–36.0)
MCV: 113.4 fL — ABNORMAL HIGH (ref 80.0–100.0)
Platelets: 272 K/uL (ref 150–400)
RBC: 2.38 MIL/uL — ABNORMAL LOW (ref 3.87–5.11)
RDW: 19.3 % — ABNORMAL HIGH (ref 11.5–15.5)
WBC: 9.2 K/uL (ref 4.0–10.5)
nRBC: 0.2 % (ref 0.0–0.2)

## 2024-06-29 LAB — MAGNESIUM: Magnesium: 2 mg/dL (ref 1.7–2.4)

## 2024-06-29 LAB — TROPONIN T, HIGH SENSITIVITY
Troponin T High Sensitivity: 94 ng/L — ABNORMAL HIGH (ref 0–19)
Troponin T High Sensitivity: 89 ng/L — ABNORMAL HIGH (ref 0–19)

## 2024-06-29 MED ORDER — HEPARIN SOD (PORK) LOCK FLUSH 100 UNIT/ML IV SOLN
500.0000 [IU] | Freq: Once | INTRAVENOUS | Status: AC
  Administered 2024-06-29: 500 [IU] via INTRAVENOUS
  Filled 2024-06-29: qty 5

## 2024-06-29 NOTE — ED Notes (Signed)
 Blood pressure cuff placed on R arm. Pt and visitor stated she has dialysis and pressures are taken on that arm all treatment 3 times per week. R arm safe for blood pressures.

## 2024-06-29 NOTE — ED Notes (Signed)
 Patient transported to X-ray

## 2024-06-29 NOTE — ED Triage Notes (Signed)
 Pt arrived POV with c/o tachycardia for a few days but today she became more SOB. Dialysis mon/wed/fri compliant. Pt is actively being treated for cancer.

## 2024-06-29 NOTE — Discharge Instructions (Signed)
 You were seen in the emergency department for elevated heart rate shortness of breath and general weakness.  You had lab work and chest x-ray that did not show an obvious explanation for your symptoms.  It is possibly related to your new medications so you should discuss that with your kidney specialist.  Please return to the emergency department if any worsening or concerning symptoms.

## 2024-06-29 NOTE — ED Provider Notes (Signed)
 [place] EMERGENCY DEPARTMENT AT [facility] Provider Note   CSN: [id number] Arrival date & time: 06/29/24  1507     Patient presents with: Tachycardia   Megan [name] is a 63 y.o. female.  She has a history of recurrent breast cancer on active chemotherapy, end-stage renal disease with dialysis Monday Wednesday Friday.  She said that she has been having intermittent tachycardia since she was discharged few weeks ago.  Associated with some general weakness poor appetite.  Sometimes she feels short of breath.  She is chronically on oxygen .  No significant fever cough vomiting.  Did have some diarrhea after last chemotherapy but that improved with medication.  No chest or abdominal pain.  Makes very little urine, not having urinary symptoms.  {Add pertinent medical, surgical, social history, OB history to YEP:67052} The history is provided by the patient.  Palpitations Palpitations quality:  Fast Onset quality:  Gradual Duration:  2 weeks Timing:  Intermittent Progression:  Unchanged Chronicity:  New Relieved by:  Nothing Worsened by:  Nothing Ineffective treatments:  None tried Associated symptoms: malaise/fatigue and shortness of breath   Associated symptoms: no chest pain, no chest pressure, no cough, no diaphoresis, no syncope and no vomiting        Prior to Admission medications   Medication Sig Start Date End Date Taking? Authorizing Provider  acetaminophen  (TYLENOL ) 500 MG tablet Take 1,000 mg by mouth every 8 (eight) hours as needed for moderate pain (pain score 4-6).    [provider]  albuterol  (PROVENTIL ) (2.5 MG/3ML) 0.083% nebulizer solution Take 3 mLs (2.5 mg total) by nebulization every 4 (four) hours as needed for wheezing or shortness of breath. 05/15/24 05/15/25  Pearlean Manus, MD  albuterol  (VENTOLIN  HFA) 108 (90 Base) MCG/ACT inhaler Inhale 2 puffs into the lungs every 6 (six) hours as needed for wheezing or shortness of breath.  05/15/24   Pearlean Manus, MD  aspirin  EC 81 MG tablet Take 81 mg by mouth daily.    [provider]  B Complex-C-Zn-Folic Acid  (DIALYVITE  800-ZINC  15) 0.8 MG TABS Take 1 tablet by mouth daily. 06/19/22   [provider]  chlorpheniramine-HYDROcodone  (TUSSIONEX) 10-8 MG/5ML Take 5 mLs by mouth every 12 (twelve) hours as needed (Severe coughing spells.). 05/26/24   Ricky Fines, MD  cinacalcet  (SENSIPAR ) 90 MG tablet Take 90 mg by mouth daily. 03/17/24   [provider]  diphenoxylate -atropine  (LOMOTIL ) 2.5-0.025 MG tablet Take 1 tablet by mouth 4 (four) times daily as needed for diarrhea or loose stools. 05/06/24   Davonna Siad, MD  fentaNYL  (DURAGESIC ) 50 MCG/HR Place 1 patch onto the skin every 3 (three) days. 06/20/24   Duanne Arseniy Toomey DASEN, MD  fluconazole  (DIFLUCAN ) 100 MG tablet Take 1 tablet (100 mg total) by mouth every other day. 06/05/24   Kandala, Hyndavi, MD  gabapentin  (NEURONTIN ) 300 MG capsule TAKE 1 CAPSULE(300 MG) BY MOUTH AT BEDTIME 06/12/24   Duanne Audryanna Zurita DASEN, MD  guaiFENesin -codeine  100-10 MG/5ML syrup Take 5 mLs by mouth every 12 (twelve) hours as needed for cough. 06/03/24   Davonna Siad, MD  hydrocortisone  (ANUSOL -HC) 2.5 % rectal cream Place rectally 4 (four) times daily. 06/11/24   Evonnie Lenis, MD  lactobacillus (FLORANEX/LACTINEX) PACK Take 1 packet (1 g total) by mouth 3 (three) times daily with meals. 06/14/24   ShahmehdiAdriana LABOR, MD  lanthanum  (FOSRENOL ) 1000 MG chewable tablet Chew 2,000-3,000 mg by mouth See admin instructions. Take 3 tablets (3000 mg) by mouth with meals and  take 2 tablets (2000 mg) with snacks 10/16/19   [provider]  lidocaine  (XYLOCAINE ) 2 % solution Use as directed 15 mLs in the mouth or throat every 4 (four) hours as needed for mouth pain (Mix 1:1 with Maalox and swish and spit). 05/22/24   Davonna Siad, MD  lidocaine -prilocaine  (EMLA ) cream Apply to affected area once 05/29/24   Kandala, Hyndavi, MD   loperamide  (IMODIUM  A-D) 2 MG tablet Take 2 mg by mouth 4 (four) times daily as needed for diarrhea or loose stools.    [provider]  loperamide  (IMODIUM ) 2 MG capsule Take 2 tabs by mouth with first loose stool, then 1 tab with each additional loose stool as needed. Do not exceed 8 tabs in a 24-hour period 05/29/24   Davonna Siad, MD  midodrine  (PROAMATINE ) 2.5 MG tablet Take 1 tablet (2.5 mg total) by mouth 3 (three) times daily with meals. 06/14/24 07/14/24  Willette Adriana LABOR, MD  Nebulizers (COMPRESSOR/NEBULIZER) MISC 1 Units by Does not apply route daily as needed. 05/15/24   Pearlean Manus, MD  nystatin (MYCOSTATIN) 100000 UNIT/ML suspension Take 5 mLs (500,000 Units total) by mouth 4 (four) times daily. 06/14/24   Shahmehdi, Adriana LABOR, MD  ondansetron  (ZOFRAN ) 8 MG tablet Take 1 tablet (8 mg total) by mouth every 8 (eight) hours as needed for nausea or vomiting. Start on the third day after chemotherapy. 05/29/24   Kandala, Hyndavi, MD  pantoprazole  (PROTONIX ) 40 MG tablet Take 1 tablet (40 mg total) by mouth 2 (two) times daily before a meal. 10/08/23   Rourk, Lamar HERO, MD  phenol (CHLORASEPTIC) 1.4 % LIQD Use as directed 1 spray in the mouth or throat as needed for throat irritation / pain. 05/26/24   Ricky Fines, MD  prochlorperazine  (COMPAZINE ) 10 MG tablet Take 1 tablet (10 mg total) by mouth every 6 (six) hours as needed for nausea or vomiting. 05/29/24   Davonna Siad, MD    Allergies: Motrin [ibuprofen] and Trodelvy [sacituzumab govitecan-hziy]    Review of Systems  Constitutional:  Positive for malaise/fatigue. Negative for diaphoresis.  Respiratory:  Positive for shortness of breath. Negative for cough.   Cardiovascular:  Positive for palpitations. Negative for chest pain and syncope.  Gastrointestinal:  Negative for vomiting.    Updated Vital Signs BP 116/75 (BP Location: Right Arm)   Pulse 90   Temp 98.5 F (36.9 C)   Resp 20   Ht 5' 2 (1.575 m)   Wt  78.5 kg   SpO2 100%   BMI 31.64 kg/m   Physical Exam Vitals and nursing note reviewed.  Constitutional:      General: She is not in acute distress.    Appearance: Normal appearance. She is well-developed.  HENT:     Head: Normocephalic and atraumatic.  Eyes:     Conjunctiva/sclera: Conjunctivae normal.  Cardiovascular:     Rate and Rhythm: Normal rate and regular rhythm.     Heart sounds: No murmur heard. Pulmonary:     Effort: Pulmonary effort is normal. No respiratory distress.     Breath sounds: Normal breath sounds. No stridor. No wheezing.  Abdominal:     Palpations: Abdomen is soft.     Tenderness: There is no abdominal tenderness. There is no guarding or rebound.  Musculoskeletal:        General: No deformity.     Cervical back: Neck supple.  Skin:    General: Skin is warm and dry.  Neurological:  General: No focal deficit present.     Mental Status: She is alert.     GCS: GCS eye subscore is 4. GCS verbal subscore is 5. GCS motor subscore is 6.     Sensory: No sensory deficit.     Motor: No weakness.     (all labs ordered are listed, but only abnormal results are displayed) Labs Reviewed  BASIC METABOLIC PANEL WITH GFR  CBC  TROPONIN T, HIGH SENSITIVITY    EKG: None  Radiology: DG Chest 2 View Result Date: 06/29/2024 EXAM: 2 VIEW(S) XRAY OF THE CHEST 06/29/2024 04:38:00 PM COMPARISON: 06/08/2024 CLINICAL HISTORY: SOB FINDINGS: LINES, TUBES AND DEVICES: Right-sided port-a-cath tip overlying the mid SVC. LUNGS AND PLEURA: Innumerable diffuse bilateral lung nodules of varying sizes consistent with metastatic disease. No pleural effusion. No pneumothorax. HEART AND MEDIASTINUM: No acute abnormality of the cardiac and mediastinal silhouettes. BONES AND SOFT TISSUES: No acute osseous abnormality. IMPRESSION: 1. Innumerable diffuse bilateral lung nodules of varying sizes consistent with metastatic disease. Electronically signed by: Fonda Field MD 06/29/2024  04:55 PM EST RP Workstation: [id number]    {Document cardiac monitor, telemetry assessment procedure when appropriate:32947} Procedures   Medications Ordered in the ED - No data to display    {Click here for ABCD2, HEART and other calculators REFRESH Note before signing:1}                              Medical Decision Making Amount and/or Complexity of Data Reviewed Labs: ordered. Radiology: ordered.   This patient complains of ***; this involves an extensive number of treatment Options and is a complaint that carries with it a high risk of complications and morbidity. The differential includes ***  I ordered, reviewed and interpreted labs, which included *** I ordered medication *** and reviewed PMP when indicated. I ordered imaging studies which included *** and I independently    visualized and interpreted imaging which showed *** Additional history obtained from *** Previous records obtained and reviewed *** I consulted *** and discussed lab and imaging findings and discussed disposition.  Cardiac monitoring reviewed, *** Social determinants considered, *** Critical Interventions: ***  After the interventions stated above, I reevaluated the patient and found *** Admission and further testing considered, ***   {Document critical care time when appropriate  Document review of labs and clinical decision tools ie CHADS2VASC2, etc  Document your independent review of radiology images and any outside records  Document your discussion with family members, caretakers and with consultants  Document social determinants of health affecting pt's care  Document your decision making why or why not admission, treatments were needed:32947:::1}   Final diagnoses:  None    ED Discharge Orders     None

## 2024-06-30 DIAGNOSIS — Z992 Dependence on renal dialysis: Secondary | ICD-10-CM | POA: Diagnosis not present

## 2024-06-30 DIAGNOSIS — D509 Iron deficiency anemia, unspecified: Secondary | ICD-10-CM | POA: Diagnosis not present

## 2024-06-30 DIAGNOSIS — D631 Anemia in chronic kidney disease: Secondary | ICD-10-CM | POA: Diagnosis not present

## 2024-06-30 DIAGNOSIS — N186 End stage renal disease: Secondary | ICD-10-CM | POA: Diagnosis not present

## 2024-06-30 DIAGNOSIS — N2581 Secondary hyperparathyroidism of renal origin: Secondary | ICD-10-CM | POA: Diagnosis not present

## 2024-07-01 ENCOUNTER — Inpatient Hospital Stay

## 2024-07-01 ENCOUNTER — Other Ambulatory Visit: Payer: Self-pay | Admitting: *Deleted

## 2024-07-01 ENCOUNTER — Telehealth: Payer: Self-pay | Admitting: Oncology

## 2024-07-01 VITALS — BP 106/64 | HR 105 | Temp 97.5°F | Resp 19

## 2024-07-01 DIAGNOSIS — C787 Secondary malignant neoplasm of liver and intrahepatic bile duct: Secondary | ICD-10-CM | POA: Diagnosis not present

## 2024-07-01 DIAGNOSIS — C7800 Secondary malignant neoplasm of unspecified lung: Secondary | ICD-10-CM | POA: Diagnosis not present

## 2024-07-01 DIAGNOSIS — C799 Secondary malignant neoplasm of unspecified site: Secondary | ICD-10-CM

## 2024-07-01 DIAGNOSIS — Z5112 Encounter for antineoplastic immunotherapy: Secondary | ICD-10-CM | POA: Diagnosis not present

## 2024-07-01 DIAGNOSIS — Z171 Estrogen receptor negative status [ER-]: Secondary | ICD-10-CM | POA: Diagnosis not present

## 2024-07-01 DIAGNOSIS — C50911 Malignant neoplasm of unspecified site of right female breast: Secondary | ICD-10-CM | POA: Diagnosis not present

## 2024-07-01 DIAGNOSIS — Z1721 Progesterone receptor positive status: Secondary | ICD-10-CM | POA: Diagnosis not present

## 2024-07-01 LAB — CBC WITH DIFFERENTIAL/PLATELET
Abs Immature Granulocytes: 0.04 K/uL (ref 0.00–0.07)
Basophils Absolute: 0.1 K/uL (ref 0.0–0.1)
Basophils Relative: 1 %
Eosinophils Absolute: 0.1 K/uL (ref 0.0–0.5)
Eosinophils Relative: 1 %
HCT: 27.8 % — ABNORMAL LOW (ref 36.0–46.0)
Hemoglobin: 8.2 g/dL — ABNORMAL LOW (ref 12.0–15.0)
Immature Granulocytes: 1 %
Lymphocytes Relative: 20 %
Lymphs Abs: 1.6 K/uL (ref 0.7–4.0)
MCH: 33.7 pg (ref 26.0–34.0)
MCHC: 29.5 g/dL — ABNORMAL LOW (ref 30.0–36.0)
MCV: 114.4 fL — ABNORMAL HIGH (ref 80.0–100.0)
Monocytes Absolute: 1.2 K/uL — ABNORMAL HIGH (ref 0.1–1.0)
Monocytes Relative: 15 %
Neutro Abs: 5 K/uL (ref 1.7–7.7)
Neutrophils Relative %: 62 %
Platelets: 305 K/uL (ref 150–400)
RBC: 2.43 MIL/uL — ABNORMAL LOW (ref 3.87–5.11)
RDW: 19.7 % — ABNORMAL HIGH (ref 11.5–15.5)
Smear Review: NORMAL
WBC: 7.9 K/uL (ref 4.0–10.5)
nRBC: 0 % (ref 0.0–0.2)

## 2024-07-01 LAB — COMPREHENSIVE METABOLIC PANEL WITH GFR
ALT: 33 U/L (ref 0–44)
AST: 45 U/L — ABNORMAL HIGH (ref 15–41)
Albumin: 3 g/dL — ABNORMAL LOW (ref 3.5–5.0)
Alkaline Phosphatase: 168 U/L — ABNORMAL HIGH (ref 38–126)
Anion gap: 11 (ref 5–15)
BUN: 11 mg/dL (ref 8–23)
CO2: 31 mmol/L (ref 22–32)
Calcium: 8.1 mg/dL — ABNORMAL LOW (ref 8.9–10.3)
Chloride: 96 mmol/L — ABNORMAL LOW (ref 98–111)
Creatinine, Ser: 6.52 mg/dL — ABNORMAL HIGH (ref 0.44–1.00)
GFR, Estimated: 7 mL/min — ABNORMAL LOW (ref >60)
Glucose, Bld: 103 mg/dL — ABNORMAL HIGH (ref 70–99)
Potassium: 3.4 mmol/L — ABNORMAL LOW (ref 3.5–5.1)
Sodium: 138 mmol/L (ref 135–145)
Total Bilirubin: 0.4 mg/dL (ref 0.0–1.2)
Total Protein: 6.2 g/dL — ABNORMAL LOW (ref 6.5–8.1)

## 2024-07-01 LAB — PHOSPHORUS: Phosphorus: 2.4 mg/dL — ABNORMAL LOW (ref 2.5–4.6)

## 2024-07-01 LAB — MAGNESIUM: Magnesium: 2.2 mg/dL (ref 1.7–2.4)

## 2024-07-01 LAB — SAMPLE TO BLOOD BANK

## 2024-07-01 MED ORDER — FAMOTIDINE IN NACL 20-0.9 MG/50ML-% IV SOLN
20.0000 mg | Freq: Once | INTRAVENOUS | Status: AC
  Administered 2024-07-01: 20 mg via INTRAVENOUS
  Filled 2024-07-01: qty 50

## 2024-07-01 MED ORDER — APREPITANT 130 MG/18ML IV EMUL
130.0000 mg | Freq: Once | INTRAVENOUS | Status: AC
  Administered 2024-07-01: 130 mg via INTRAVENOUS

## 2024-07-01 MED ORDER — DEXAMETHASONE SOD PHOSPHATE PF 10 MG/ML IJ SOLN
10.0000 mg | Freq: Once | INTRAMUSCULAR | Status: AC
  Administered 2024-07-01: 10 mg via INTRAVENOUS

## 2024-07-01 MED ORDER — ACETAMINOPHEN 325 MG PO TABS
650.0000 mg | ORAL_TABLET | Freq: Once | ORAL | Status: AC
  Administered 2024-07-01: 650 mg via ORAL
  Filled 2024-07-01: qty 2

## 2024-07-01 MED ORDER — SODIUM CHLORIDE 0.9 % IV SOLN: INTRAVENOUS | Status: DC

## 2024-07-01 MED ORDER — POTASSIUM CHLORIDE CRYS ER 20 MEQ PO TBCR
40.0000 meq | EXTENDED_RELEASE_TABLET | Freq: Once | ORAL | Status: AC
  Administered 2024-07-01: 40 meq via ORAL
  Filled 2024-07-01: qty 2

## 2024-07-01 MED ORDER — NYSTATIN 100000 UNIT/ML MT SUSP: 5.0000 mL | Freq: Four times a day (QID) | OROMUCOSAL | 1 refills | Status: AC

## 2024-07-01 MED ORDER — CETIRIZINE HCL 10 MG/ML IV SOLN
10.0000 mg | Freq: Once | INTRAVENOUS | Status: AC
  Administered 2024-07-01: 10 mg via INTRAVENOUS
  Filled 2024-07-01: qty 1

## 2024-07-01 MED ORDER — DIPHENOXYLATE-ATROPINE 2.5-0.025 MG PO TABS: 1.0000 | ORAL_TABLET | Freq: Four times a day (QID) | ORAL | 0 refills | Status: AC | PRN

## 2024-07-01 MED ORDER — ATROPINE SULFATE 1 MG/ML IV SOLN
0.5000 mg | Freq: Once | INTRAVENOUS | Status: AC
  Administered 2024-07-01: 0.5 mg via INTRAVENOUS
  Filled 2024-07-01: qty 1

## 2024-07-01 MED ORDER — SODIUM CHLORIDE 0.9 % IV SOLN
4.2000 mg/kg | Freq: Once | INTRAVENOUS | Status: AC
  Administered 2024-07-01: 360 mg via INTRAVENOUS
  Filled 2024-07-01: qty 36

## 2024-07-01 MED ORDER — PALONOSETRON HCL INJECTION 0.25 MG/5ML
0.2500 mg | Freq: Once | INTRAVENOUS | Status: AC
  Administered 2024-07-01: 0.25 mg via INTRAVENOUS
  Filled 2024-07-01: qty 5

## 2024-07-01 NOTE — Progress Notes (Signed)
 Labs reviewed with MD today. Creatinine 6.52 OK to proceed with treatment as planned. Potassium 3.4, will give some K+ per protocol.   HR is 117 ok to treat per MD.  Treatment given per orders. Patient tolerated it well without problems. Vitals stable and discharged home from clinic via wheelchair. Follow up as scheduled.

## 2024-07-01 NOTE — Telephone Encounter (Signed)
 Patient not feeling well while waiting for her treatment today.  Reports feeling dizzy and tired and felt like she may pass out.  Patient was taken to her room where she could lay down.  On examination, patient laying comfortably in bed.  She reports feeling nauseated but overall better.  Reports she gets this way sometimes.  She continues dialysis 3 days/week and had yesterday.  Reports she has changed the way she is taking midodrine  from 3 times per week to only as needed on days of dialysis.  This has helped with her palpitations and feeling tired.  Overall, stable and would like to proceed with her treatment.  We reviewed her labs which are adequate for treatment.  Reports she is feeling nauseated but she has several premedications prior to chemo that will help with that.  Will add some additional IV fluids today-plan is for 500 mL prior to chemo.  Reports has helped in the past.  Patient to let us  know if she needs anything additional.  Delon Hope, NP 07/01/2024 11:21 AM

## 2024-07-01 NOTE — Progress Notes (Signed)
 Verbal order from Dr Davonna -  Decrease dose of Trodelvy to 5 mg/kg = 360 mg with new weight 73.2 kg  Plan updated as above.  Patient reports not taking Diflucan  at present.  Removes DDI with [facility].   Niels Molt, PharmD Clinical Pharmacist 07/01/24

## 2024-07-01 NOTE — Patient Instructions (Signed)
 [facility] - A DEPT OF [facility]  Discharge Instructions: Thank you for choosing [facility] to provide your oncology and hematology care.  If you have a lab appointment with the Cancer Center - please note that after April 8th, 2024, all labs will be drawn in the cancer center.  You do not have to check in or register with the main entrance as you have in the past but will complete your check-in in the cancer center.  Wear comfortable clothing and clothing appropriate for easy access to any Portacath or PICC line.   We strive to give you quality time with your provider. You may need to reschedule your appointment if you arrive late (15 or more minutes).  Arriving late affects you and other patients whose appointments are after yours.  Also, if you miss three or more appointments without notifying the office, you may be dismissed from the clinic at the provider's discretion.      For prescription refill requests, have your pharmacy contact our office and allow 72 hours for refills to be completed.    Today you received the following chemotherapy and/or immunotherapy agents Sacituzumab govitecan-hziy   To help prevent nausea and vomiting after your treatment, we encourage you to take your nausea medication as directed.  BELOW ARE SYMPTOMS THAT SHOULD BE REPORTED IMMEDIATELY: *FEVER GREATER THAN 100.4 F (38 C) OR HIGHER *CHILLS OR SWEATING *NAUSEA AND VOMITING THAT IS NOT CONTROLLED WITH YOUR NAUSEA MEDICATION *UNUSUAL SHORTNESS OF BREATH *UNUSUAL BRUISING OR BLEEDING *URINARY PROBLEMS (pain or burning when urinating, or frequent urination) *BOWEL PROBLEMS (unusual diarrhea, constipation, pain near the anus) TENDERNESS IN MOUTH AND THROAT WITH OR WITHOUT PRESENCE OF ULCERS (sore throat, sores in mouth, or a toothache) UNUSUAL RASH, SWELLING OR PAIN  UNUSUAL VAGINAL DISCHARGE OR ITCHING   Items with * indicate a potential emergency and should  be followed up as soon as possible or go to the Emergency Department if any problems should occur.  Please show the CHEMOTHERAPY ALERT CARD or IMMUNOTHERAPY ALERT CARD at check-in to the Emergency Department and triage nurse.  Should you have questions after your visit or need to cancel or reschedule your appointment, please contact [facility] [place] - A DEPT OF [name] [phone]  and follow the prompts.  Office hours are 8:00 a.m. to 4:30 p.m. Monday - Friday. Please note that voicemails left after 4:00 p.m. may not be returned until the following business day.  We are closed weekends and major holidays. You have access to a nurse at all times for urgent questions. Please call the main number to the clinic [phone] and follow the prompts.  For any non-urgent questions, you may also contact your provider using MyChart. We now offer e-Visits for anyone 67 and older to request care online for non-urgent symptoms. For details visit [email or web address].   Also download the MyChart app! Go to the app store, search MyChart, open the app, select [place], and log in with your MyChart username and password.

## 2024-07-02 ENCOUNTER — Other Ambulatory Visit: Payer: Self-pay | Admitting: Oncology

## 2024-07-02 DIAGNOSIS — N186 End stage renal disease: Secondary | ICD-10-CM | POA: Diagnosis not present

## 2024-07-02 DIAGNOSIS — D509 Iron deficiency anemia, unspecified: Secondary | ICD-10-CM | POA: Diagnosis not present

## 2024-07-02 DIAGNOSIS — Z992 Dependence on renal dialysis: Secondary | ICD-10-CM | POA: Diagnosis not present

## 2024-07-02 DIAGNOSIS — C50911 Malignant neoplasm of unspecified site of right female breast: Secondary | ICD-10-CM

## 2024-07-02 DIAGNOSIS — N2581 Secondary hyperparathyroidism of renal origin: Secondary | ICD-10-CM | POA: Diagnosis not present

## 2024-07-02 DIAGNOSIS — D631 Anemia in chronic kidney disease: Secondary | ICD-10-CM | POA: Diagnosis not present

## 2024-07-02 LAB — CANCER ANTIGEN 15-3: CA 15-3: 127 U/mL — ABNORMAL HIGH (ref 0.0–25.0)

## 2024-07-02 LAB — CANCER ANTIGEN 27.29: CA 27.29: 173.5 U/mL — ABNORMAL HIGH (ref 0.0–38.6)

## 2024-07-03 ENCOUNTER — Other Ambulatory Visit: Payer: Self-pay | Admitting: Oncology

## 2024-07-04 DIAGNOSIS — N186 End stage renal disease: Secondary | ICD-10-CM | POA: Diagnosis not present

## 2024-07-04 DIAGNOSIS — D509 Iron deficiency anemia, unspecified: Secondary | ICD-10-CM | POA: Diagnosis not present

## 2024-07-04 DIAGNOSIS — Z992 Dependence on renal dialysis: Secondary | ICD-10-CM | POA: Diagnosis not present

## 2024-07-04 DIAGNOSIS — N2581 Secondary hyperparathyroidism of renal origin: Secondary | ICD-10-CM | POA: Diagnosis not present

## 2024-07-04 DIAGNOSIS — D631 Anemia in chronic kidney disease: Secondary | ICD-10-CM | POA: Diagnosis not present

## 2024-07-06 ENCOUNTER — Encounter: Payer: Self-pay | Admitting: Oncology

## 2024-07-06 DIAGNOSIS — D631 Anemia in chronic kidney disease: Secondary | ICD-10-CM | POA: Diagnosis not present

## 2024-07-06 DIAGNOSIS — D509 Iron deficiency anemia, unspecified: Secondary | ICD-10-CM | POA: Diagnosis not present

## 2024-07-06 DIAGNOSIS — N186 End stage renal disease: Secondary | ICD-10-CM | POA: Diagnosis not present

## 2024-07-06 DIAGNOSIS — N2581 Secondary hyperparathyroidism of renal origin: Secondary | ICD-10-CM | POA: Diagnosis not present

## 2024-07-06 DIAGNOSIS — Z992 Dependence on renal dialysis: Secondary | ICD-10-CM | POA: Diagnosis not present

## 2024-07-08 ENCOUNTER — Other Ambulatory Visit: Payer: Self-pay | Admitting: *Deleted

## 2024-07-08 ENCOUNTER — Inpatient Hospital Stay

## 2024-07-08 VITALS — BP 126/71 | HR 100 | Temp 97.2°F | Resp 18

## 2024-07-08 DIAGNOSIS — Z5112 Encounter for antineoplastic immunotherapy: Secondary | ICD-10-CM | POA: Diagnosis not present

## 2024-07-08 DIAGNOSIS — D509 Iron deficiency anemia, unspecified: Secondary | ICD-10-CM | POA: Diagnosis not present

## 2024-07-08 DIAGNOSIS — Z171 Estrogen receptor negative status [ER-]: Secondary | ICD-10-CM | POA: Diagnosis not present

## 2024-07-08 DIAGNOSIS — C50911 Malignant neoplasm of unspecified site of right female breast: Secondary | ICD-10-CM | POA: Diagnosis not present

## 2024-07-08 DIAGNOSIS — Z1721 Progesterone receptor positive status: Secondary | ICD-10-CM | POA: Diagnosis not present

## 2024-07-08 DIAGNOSIS — C787 Secondary malignant neoplasm of liver and intrahepatic bile duct: Secondary | ICD-10-CM | POA: Diagnosis not present

## 2024-07-08 DIAGNOSIS — C7800 Secondary malignant neoplasm of unspecified lung: Secondary | ICD-10-CM | POA: Diagnosis not present

## 2024-07-08 DIAGNOSIS — N186 End stage renal disease: Secondary | ICD-10-CM | POA: Diagnosis not present

## 2024-07-08 DIAGNOSIS — Z992 Dependence on renal dialysis: Secondary | ICD-10-CM | POA: Diagnosis not present

## 2024-07-08 DIAGNOSIS — N2581 Secondary hyperparathyroidism of renal origin: Secondary | ICD-10-CM | POA: Diagnosis not present

## 2024-07-08 DIAGNOSIS — D631 Anemia in chronic kidney disease: Secondary | ICD-10-CM | POA: Diagnosis not present

## 2024-07-08 LAB — MAGNESIUM: Magnesium: 2 mg/dL (ref 1.7–2.4)

## 2024-07-08 LAB — COMPREHENSIVE METABOLIC PANEL WITH GFR
ALT: 24 U/L (ref 0–44)
AST: 23 U/L (ref 15–41)
Albumin: 3.3 g/dL — ABNORMAL LOW (ref 3.5–5.0)
Alkaline Phosphatase: 144 U/L — ABNORMAL HIGH (ref 38–126)
Anion gap: 10 (ref 5–15)
BUN: 12 mg/dL (ref 8–23)
CO2: 31 mmol/L (ref 22–32)
Calcium: 8.2 mg/dL — ABNORMAL LOW (ref 8.9–10.3)
Chloride: 97 mmol/L — ABNORMAL LOW (ref 98–111)
Creatinine, Ser: 3.81 mg/dL — ABNORMAL HIGH (ref 0.44–1.00)
GFR, Estimated: 13 mL/min — ABNORMAL LOW (ref >60)
Glucose, Bld: 114 mg/dL — ABNORMAL HIGH (ref 70–99)
Potassium: 3.3 mmol/L — ABNORMAL LOW (ref 3.5–5.1)
Sodium: 138 mmol/L (ref 135–145)
Total Bilirubin: 0.4 mg/dL (ref 0.0–1.2)
Total Protein: 6.2 g/dL — ABNORMAL LOW (ref 6.5–8.1)

## 2024-07-08 LAB — CBC WITH DIFFERENTIAL/PLATELET
Abs Immature Granulocytes: 0.16 K/uL — ABNORMAL HIGH (ref 0.00–0.07)
Basophils Absolute: 0 K/uL (ref 0.0–0.1)
Basophils Relative: 0 %
Eosinophils Absolute: 0 K/uL (ref 0.0–0.5)
Eosinophils Relative: 0 %
HCT: 26.7 % — ABNORMAL LOW (ref 36.0–46.0)
Hemoglobin: 8.1 g/dL — ABNORMAL LOW (ref 12.0–15.0)
Immature Granulocytes: 2 %
Lymphocytes Relative: 11 %
Lymphs Abs: 0.8 K/uL (ref 0.7–4.0)
MCH: 34 pg (ref 26.0–34.0)
MCHC: 30.3 g/dL (ref 30.0–36.0)
MCV: 112.2 fL — ABNORMAL HIGH (ref 80.0–100.0)
Monocytes Absolute: 1.2 K/uL — ABNORMAL HIGH (ref 0.1–1.0)
Monocytes Relative: 15 %
Neutro Abs: 5.7 K/uL (ref 1.7–7.7)
Neutrophils Relative %: 72 %
Platelets: 327 K/uL (ref 150–400)
RBC: 2.38 MIL/uL — ABNORMAL LOW (ref 3.87–5.11)
RDW: 19.1 % — ABNORMAL HIGH (ref 11.5–15.5)
Smear Review: NORMAL
WBC: 7.9 K/uL (ref 4.0–10.5)
nRBC: 0.4 % — ABNORMAL HIGH (ref 0.0–0.2)

## 2024-07-08 LAB — PHOSPHORUS: Phosphorus: 1.8 mg/dL — ABNORMAL LOW (ref 2.5–4.6)

## 2024-07-08 MED ORDER — ACETAMINOPHEN 325 MG PO TABS
650.0000 mg | ORAL_TABLET | Freq: Once | ORAL | Status: AC
  Administered 2024-07-08: 650 mg via ORAL
  Filled 2024-07-08: qty 2

## 2024-07-08 MED ORDER — DEXAMETHASONE SOD PHOSPHATE PF 10 MG/ML IJ SOLN
10.0000 mg | Freq: Once | INTRAMUSCULAR | Status: AC
  Administered 2024-07-08: 10 mg via INTRAVENOUS

## 2024-07-08 MED ORDER — FAMOTIDINE IN NACL 20-0.9 MG/50ML-% IV SOLN
20.0000 mg | Freq: Once | INTRAVENOUS | Status: AC
  Administered 2024-07-08: 20 mg via INTRAVENOUS
  Filled 2024-07-08: qty 50

## 2024-07-08 MED ORDER — APREPITANT 130 MG/18ML IV EMUL
130.0000 mg | Freq: Once | INTRAVENOUS | Status: AC
  Administered 2024-07-08: 130 mg via INTRAVENOUS
  Filled 2024-07-08: qty 18

## 2024-07-08 MED ORDER — SODIUM CHLORIDE 0.9 % IV SOLN: INTRAVENOUS | Status: DC

## 2024-07-08 MED ORDER — CETIRIZINE HCL 10 MG/ML IV SOLN
10.0000 mg | Freq: Once | INTRAVENOUS | Status: AC
  Administered 2024-07-08: 10 mg via INTRAVENOUS
  Filled 2024-07-08: qty 1

## 2024-07-08 MED ORDER — SODIUM CHLORIDE 0.9 % IV SOLN
5.0000 mg/kg | Freq: Once | INTRAVENOUS | Status: AC
  Administered 2024-07-08: 360 mg via INTRAVENOUS
  Filled 2024-07-08: qty 36

## 2024-07-08 MED ORDER — SODIUM CHLORIDE 0.9 % IV SOLN
20.0000 mmol | Freq: Once | INTRAVENOUS | Status: AC
  Administered 2024-07-08: 20 mmol via INTRAVENOUS
  Filled 2024-07-08: qty 6.67

## 2024-07-08 MED ORDER — PALONOSETRON HCL INJECTION 0.25 MG/5ML
0.2500 mg | Freq: Once | INTRAVENOUS | Status: AC
  Administered 2024-07-08: 0.25 mg via INTRAVENOUS
  Filled 2024-07-08: qty 5

## 2024-07-08 MED ORDER — GUAIFENESIN DM 10-100 MG/5ML PO LIQD
5.0000 mL | Freq: Once | ORAL | Status: AC
  Administered 2024-07-08: 5 mL via ORAL
  Filled 2024-07-08: qty 10

## 2024-07-08 MED ORDER — POTASSIUM CHLORIDE CRYS ER 20 MEQ PO TBCR
40.0000 meq | EXTENDED_RELEASE_TABLET | Freq: Once | ORAL | Status: AC
  Administered 2024-07-08: 40 meq via ORAL
  Filled 2024-07-08: qty 2

## 2024-07-08 MED ORDER — ATROPINE SULFATE 1 MG/ML IV SOLN
0.5000 mg | Freq: Once | INTRAVENOUS | Status: AC
  Administered 2024-07-08: 0.5 mg via INTRAVENOUS
  Filled 2024-07-08: qty 1

## 2024-07-08 NOTE — Patient Instructions (Signed)
 [facility] [place] - A DEPT OF [facility]  Discharge Instructions: Thank you for choosing [facility] to provide your oncology and hematology care.  If you have a lab appointment with the Cancer Center - please note that after April 8th, 2024, all labs will be drawn in the cancer center.  You do not have to check in or register with the main entrance as you have in the past but will complete your check-in in the cancer center.  Wear comfortable clothing and clothing appropriate for easy access to any Portacath or PICC line.   We strive to give you quality time with your provider. You may need to reschedule your appointment if you arrive late (15 or more minutes).  Arriving late affects you and other patients whose appointments are after yours.  Also, if you miss three or more appointments without notifying the office, you may be dismissed from the clinic at the provider's discretion.      For prescription refill requests, have your pharmacy contact our office and allow 72 hours for refills to be completed.    Today you received the following chemotherapy and/or immunotherapy agents    To help prevent nausea and vomiting after your treatment, we encourage you to take your nausea medication as directed.  BELOW ARE SYMPTOMS THAT SHOULD BE REPORTED IMMEDIATELY: *FEVER GREATER THAN 100.4 F (38 C) OR HIGHER *CHILLS OR SWEATING *NAUSEA AND VOMITING THAT IS NOT CONTROLLED WITH YOUR NAUSEA MEDICATION *UNUSUAL SHORTNESS OF BREATH *UNUSUAL BRUISING OR BLEEDING *URINARY PROBLEMS (pain or burning when urinating, or frequent urination) *BOWEL PROBLEMS (unusual diarrhea, constipation, pain near the anus) TENDERNESS IN MOUTH AND THROAT WITH OR WITHOUT PRESENCE OF ULCERS (sore throat, sores in mouth, or a toothache) UNUSUAL RASH, SWELLING OR PAIN  UNUSUAL VAGINAL DISCHARGE OR ITCHING   Items with * indicate a potential emergency and should be followed up as soon as  possible or go to the Emergency Department if any problems should occur.  Please show the CHEMOTHERAPY ALERT CARD or IMMUNOTHERAPY ALERT CARD at check-in to the Emergency Department and triage nurse.  Should you have questions after your visit or need to cancel or reschedule your appointment, please contact [facility] [place] - A DEPT OF [facility] [phone]  and follow the prompts.  Office hours are 8:00 a.m. to 4:30 p.m. Monday - Friday. Please note that voicemails left after 4:00 p.m. may not be returned until the following business day.  We are closed weekends and major holidays. You have access to a nurse at all times for urgent questions. Please call the main number to the clinic [phone] and follow the prompts.  For any non-urgent questions, you may also contact your provider using MyChart. We now offer e-Visits for anyone 79 and older to request care online for non-urgent symptoms. For details visit [email or web address].   Also download the MyChart app! Go to the app store, search "MyChart", open the app, select [place], and log in with your MyChart username and password.

## 2024-07-08 NOTE — Progress Notes (Signed)
 Patient presents today for chemotherapy Trodelvy  infusion.  Patient is in satisfactory condition with no new complaints voiced.  All other vital signs are stable. Patient's HR noted to be 106  Labs reviewed and all labs are within treatment parameters.  We will proceed with treatment per MD orders.   Patient's phosphorus level noted to be 1.8. Patient will receive potassium phosphate  over 90 minutes per Dr.Kandala/C.Jones,PharmD. Patient and patient's husband made aware and verbalized understanding.

## 2024-07-08 NOTE — Progress Notes (Signed)
 Potassium Phosphate  level:  1.8  Orders received from Dr Davonna to give Potassium Phosphate  20 mmol IVPB x 1  Niels Molt, PharmD Clinical Pharmacist

## 2024-07-08 NOTE — Progress Notes (Signed)
Treatment given today per MD orders.  Tolerated infusion without adverse affects.  Vital signs stable.  No complaints at this time.  Discharge from clinic via wheelchair in stable condition.  Alert and oriented X 3.  Follow up with [facility] as scheduled. 

## 2024-07-09 ENCOUNTER — Inpatient Hospital Stay

## 2024-07-09 VITALS — BP 104/63 | HR 114 | Temp 97.9°F | Resp 16

## 2024-07-09 DIAGNOSIS — C7800 Secondary malignant neoplasm of unspecified lung: Secondary | ICD-10-CM | POA: Diagnosis not present

## 2024-07-09 DIAGNOSIS — Z1721 Progesterone receptor positive status: Secondary | ICD-10-CM | POA: Diagnosis not present

## 2024-07-09 DIAGNOSIS — Z171 Estrogen receptor negative status [ER-]: Secondary | ICD-10-CM | POA: Diagnosis not present

## 2024-07-09 DIAGNOSIS — C50911 Malignant neoplasm of unspecified site of right female breast: Secondary | ICD-10-CM

## 2024-07-09 DIAGNOSIS — Z5112 Encounter for antineoplastic immunotherapy: Secondary | ICD-10-CM | POA: Diagnosis not present

## 2024-07-09 DIAGNOSIS — C787 Secondary malignant neoplasm of liver and intrahepatic bile duct: Secondary | ICD-10-CM | POA: Diagnosis not present

## 2024-07-09 MED ORDER — PEGFILGRASTIM-FPGK 6 MG/0.6ML ~~LOC~~ SOSY
6.0000 mg | PREFILLED_SYRINGE | Freq: Once | SUBCUTANEOUS | Status: AC
  Administered 2024-07-09: 6 mg via SUBCUTANEOUS

## 2024-07-09 MED FILL — Dextromethorphan-Guaifenesin Syrup 10-100 MG/5ML: ORAL | Qty: 10 | Status: AC

## 2024-07-09 NOTE — Patient Instructions (Signed)
 [facility] [place] - A [facility]  Discharge Instructions: Thank you for choosing [facility] to provide your oncology and hematology care.  If you have a lab appointment with the Cancer Center - please note that after April 8th, 2024, all labs will be drawn in the cancer center.  You do not have to check in or register with the main entrance as you have in the past but will complete your check-in in the cancer center.  Wear comfortable clothing and clothing appropriate for easy access to any Portacath or PICC line.   We strive to give you quality time with your provider. You may need to reschedule your appointment if you arrive late (15 or more minutes).  Arriving late affects you and other patients whose appointments are after yours.  Also, if you miss three or more appointments without notifying the office, you may be dismissed from the clinic at the provider's discretion.      For prescription refill requests, have your pharmacy contact our office and allow 72 hours for refills to be completed.    Today you received the following chemotherapy and/or immunotherapy agents stimufend      To help prevent nausea and vomiting after your treatment, we encourage you to take your nausea medication as directed.  BELOW ARE SYMPTOMS THAT SHOULD BE REPORTED IMMEDIATELY: *FEVER GREATER THAN 100.4 F (38 C) OR HIGHER *CHILLS OR SWEATING *NAUSEA AND VOMITING THAT IS NOT CONTROLLED WITH YOUR NAUSEA MEDICATION *UNUSUAL SHORTNESS OF BREATH *UNUSUAL BRUISING OR BLEEDING *URINARY PROBLEMS (pain or burning when urinating, or frequent urination) *BOWEL PROBLEMS (unusual diarrhea, constipation, pain near the anus) TENDERNESS IN MOUTH AND THROAT WITH OR WITHOUT PRESENCE OF ULCERS (sore throat, sores in mouth, or a toothache) UNUSUAL RASH, SWELLING OR PAIN  UNUSUAL VAGINAL DISCHARGE OR ITCHING   Items with * indicate a potential emergency and should be followed up  as soon as possible or go to the Emergency Department if any problems should occur.  Please show the CHEMOTHERAPY ALERT CARD or IMMUNOTHERAPY ALERT CARD at check-in to the Emergency Department and triage nurse.  Should you have questions after your visit or need to cancel or reschedule your appointment, please contact [facility] [place] - A DEPT OF [facility] [phone]  and follow the prompts.  Office hours are 8:00 a.m. to 4:30 p.m. Monday - Friday. Please note that voicemails left after 4:00 p.m. may not be returned until the following business day.  We are closed weekends and major holidays. You have access to a nurse at all times for urgent questions. Please call the main number to the clinic [phone] and follow the prompts.  For any non-urgent questions, you may also contact your provider using MyChart. We now offer e-Visits for anyone 42 and older to request care online for non-urgent symptoms. For details visit [email or web address].   Also download the MyChart app! Go to the app store, search "MyChart", open the app, select [place], and log in with your MyChart username and password.

## 2024-07-09 NOTE — Progress Notes (Signed)
 Megan [name] presents today for Stimufend injection per the provider's orders.  Stable during administration without incident; injection site WNL; see MAR for injection details.  Patient tolerated procedure well and without incident.  No questions or complaints noted at this time.

## 2024-07-11 DIAGNOSIS — N2581 Secondary hyperparathyroidism of renal origin: Secondary | ICD-10-CM | POA: Diagnosis not present

## 2024-07-11 DIAGNOSIS — D631 Anemia in chronic kidney disease: Secondary | ICD-10-CM | POA: Diagnosis not present

## 2024-07-11 DIAGNOSIS — Z992 Dependence on renal dialysis: Secondary | ICD-10-CM | POA: Diagnosis not present

## 2024-07-11 DIAGNOSIS — D509 Iron deficiency anemia, unspecified: Secondary | ICD-10-CM | POA: Diagnosis not present

## 2024-07-11 DIAGNOSIS — N186 End stage renal disease: Secondary | ICD-10-CM | POA: Diagnosis not present

## 2024-07-18 ENCOUNTER — Emergency Department (HOSPITAL_COMMUNITY): Admission: EM | Admit: 2024-07-18 | Discharge: 2024-07-18 | Disposition: A | Discharge status: Home / Self Care

## 2024-07-18 ENCOUNTER — Emergency Department (HOSPITAL_COMMUNITY)

## 2024-07-18 ENCOUNTER — Other Ambulatory Visit: Payer: Self-pay

## 2024-07-18 DIAGNOSIS — R918 Other nonspecific abnormal finding of lung field: Secondary | ICD-10-CM | POA: Diagnosis not present

## 2024-07-18 DIAGNOSIS — R059 Cough, unspecified: Secondary | ICD-10-CM | POA: Diagnosis not present

## 2024-07-18 DIAGNOSIS — R000 Tachycardia, unspecified: Secondary | ICD-10-CM | POA: Diagnosis not present

## 2024-07-18 DIAGNOSIS — C7800 Secondary malignant neoplasm of unspecified lung: Secondary | ICD-10-CM | POA: Diagnosis not present

## 2024-07-18 DIAGNOSIS — Z853 Personal history of malignant neoplasm of breast: Secondary | ICD-10-CM | POA: Diagnosis not present

## 2024-07-18 DIAGNOSIS — C787 Secondary malignant neoplasm of liver and intrahepatic bile duct: Secondary | ICD-10-CM | POA: Diagnosis not present

## 2024-07-18 DIAGNOSIS — R053 Chronic cough: Secondary | ICD-10-CM | POA: Diagnosis not present

## 2024-07-18 DIAGNOSIS — C50919 Malignant neoplasm of unspecified site of unspecified female breast: Secondary | ICD-10-CM | POA: Diagnosis not present

## 2024-07-18 DIAGNOSIS — R0789 Other chest pain: Secondary | ICD-10-CM | POA: Diagnosis not present

## 2024-07-18 DIAGNOSIS — Z85118 Personal history of other malignant neoplasm of bronchus and lung: Secondary | ICD-10-CM | POA: Diagnosis not present

## 2024-07-18 LAB — BASIC METABOLIC PANEL WITH GFR
Anion gap: 14 (ref 5–15)
BUN: 9 mg/dL (ref 8–23)
CO2: 31 mmol/L (ref 22–32)
Calcium: 8.4 mg/dL — ABNORMAL LOW (ref 8.9–10.3)
Chloride: 95 mmol/L — ABNORMAL LOW (ref 98–111)
Creatinine, Ser: 2.89 mg/dL — ABNORMAL HIGH (ref 0.44–1.00)
GFR, Estimated: 18 mL/min — ABNORMAL LOW (ref >60)
Glucose, Bld: 95 mg/dL (ref 70–99)
Potassium: 3.2 mmol/L — ABNORMAL LOW (ref 3.5–5.1)
Sodium: 140 mmol/L (ref 135–145)

## 2024-07-18 LAB — CBC WITH DIFFERENTIAL/PLATELET
Abs Immature Granulocytes: 0.08 K/uL — ABNORMAL HIGH (ref 0.00–0.07)
Basophils Absolute: 0 K/uL (ref 0.0–0.1)
Basophils Relative: 1 %
Eosinophils Absolute: 0.1 K/uL (ref 0.0–0.5)
Eosinophils Relative: 1 %
HCT: 24.6 % — ABNORMAL LOW (ref 36.0–46.0)
Hemoglobin: 7.4 g/dL — ABNORMAL LOW (ref 12.0–15.0)
Immature Granulocytes: 1 %
Lymphocytes Relative: 13 %
Lymphs Abs: 1 K/uL (ref 0.7–4.0)
MCH: 34.4 pg — ABNORMAL HIGH (ref 26.0–34.0)
MCHC: 30.1 g/dL (ref 30.0–36.0)
MCV: 114.4 fL — ABNORMAL HIGH (ref 80.0–100.0)
Monocytes Absolute: 0.7 K/uL (ref 0.1–1.0)
Monocytes Relative: 9 %
Neutro Abs: 5.9 K/uL (ref 1.7–7.7)
Neutrophils Relative %: 75 %
Platelets: 243 K/uL (ref 150–400)
RBC: 2.15 MIL/uL — ABNORMAL LOW (ref 3.87–5.11)
RDW: 20.9 % — ABNORMAL HIGH (ref 11.5–15.5)
Smear Review: NORMAL
WBC: 7.8 K/uL (ref 4.0–10.5)
nRBC: 0 % (ref 0.0–0.2)

## 2024-07-18 LAB — TROPONIN T, HIGH SENSITIVITY
Troponin T High Sensitivity: 70 ng/L — ABNORMAL HIGH (ref 0–19)
Troponin T High Sensitivity: 71 ng/L — ABNORMAL HIGH (ref 0–19)

## 2024-07-18 LAB — D-DIMER, QUANTITATIVE: D-Dimer, Quant: 1.69 ug{FEU}/mL — ABNORMAL HIGH (ref 0.00–0.50)

## 2024-07-18 LAB — MAGNESIUM: Magnesium: 2 mg/dL (ref 1.7–2.4)

## 2024-07-18 MED ORDER — HYDROCOD POLI-CHLORPHE POLI ER 10-8 MG/5ML PO SUER: 5.0000 mL | Freq: Two times a day (BID) | ORAL | 0 refills | Status: DC | PRN

## 2024-07-18 MED ORDER — AZITHROMYCIN 250 MG PO TABS: 250.0000 mg | ORAL_TABLET | Freq: Every day | ORAL | 0 refills | Status: DC

## 2024-07-18 MED ORDER — IOHEXOL 350 MG/ML SOLN
75.0000 mL | Freq: Once | INTRAVENOUS | Status: AC | PRN
  Administered 2024-07-18: 75 mL via INTRAVENOUS

## 2024-07-18 MED ORDER — LIDOCAINE HCL (PF) 1 % IJ SOLN: 5.0000 mL | INTRAMUSCULAR | Status: DC | PRN

## 2024-07-18 MED ORDER — AMOXICILLIN-POT CLAVULANATE 875-125 MG PO TABS: 1.0000 | ORAL_TABLET | Freq: Two times a day (BID) | ORAL | 0 refills | Status: DC

## 2024-07-18 MED ORDER — BENZONATATE 100 MG PO CAPS
200.0000 mg | ORAL_CAPSULE | Freq: Once | ORAL | Status: AC
  Administered 2024-07-18: 200 mg via ORAL
  Filled 2024-07-18: qty 2

## 2024-07-18 MED ORDER — HEPARIN SOD (PORK) LOCK FLUSH 100 UNIT/ML IV SOLN
500.0000 [IU] | Freq: Once | INTRAVENOUS | Status: DC
  Filled 2024-07-18: qty 5

## 2024-07-18 NOTE — ED Notes (Signed)
 Heparin  flush attempted. Unable to flush or draw back with access. Pt noted to have port flush and lab appt on 07/22/24. Advised pt maintain appointment and return to ER if there are any abnormalities with port site.

## 2024-07-18 NOTE — Discharge Instructions (Addendum)
 You have been prescribed antibiotics and a strong cough syrup.  Cough medication can cause drowsiness so do not operate machinery or drive while taking the medication.  Your hemoglobin today was slightly lower than your baseline.  Please call your oncologist to arrange a follow-up.  Return to the emergency department for any new or worsening symptoms

## 2024-07-18 NOTE — ED Triage Notes (Signed)
 Pt states she is a cancer pt (lungs) and dialysis pt/ states cough and shob x 3-4 weeks denies any pain at this time

## 2024-07-18 NOTE — ED Provider Notes (Signed)
  EMERGENCY DEPARTMENT AT [facility] Provider Note   CSN: [id number] Arrival date & time: 07/18/24  9086     Patient presents with: Cough   Megan [name] is a 63 y.o. female.    Cough Associated symptoms: chest pain        Megan [name] is a 63 y.o. 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  she presents to the Emergency Department complaining of cough for 3 to 4 weeks.  She states that she has a forceful persistent cough that is nonproductive.  She is concerned that the cough may be related to her lung cancer.  She denies any hemoptysis.  She wears 2 L of O2 continuously she denies feeling any shortness of breath.  She is having some chest discomfort to her upper central chest that is associated with coughing.  She denies any fever or chills abdominal pain nausea or vomiting.  Does not feel that she is having any cold symptoms.    She completed her dialysis treatment just before ER arrival.   Prior to Admission medications   Medication Sig Start Date End Date Taking? Authorizing Provider  acetaminophen  (TYLENOL ) 500 MG tablet Take 1,000 mg by mouth every 8 (eight) hours as needed for moderate pain (pain score 4-6).    [provider]  albuterol  (PROVENTIL ) (2.5 MG/3ML) 0.083% nebulizer solution Take 3 mLs (2.5 mg total) by nebulization every 4 (four) hours as needed for wheezing or shortness of breath. 05/15/24 05/15/25  Pearlean Manus, MD  albuterol  (VENTOLIN  HFA) 108 (90 Base) MCG/ACT inhaler Inhale 2 puffs into the lungs every 6 (six) hours as needed for wheezing or shortness of breath. 05/15/24   Pearlean Manus, MD  aspirin  EC 81 MG tablet Take 81 mg by mouth daily.    [provider]  B  Complex-C-Zn-Folic Acid  (DIALYVITE  800-ZINC  15) 0.8 MG TABS Take 1 tablet by mouth daily. 06/19/22   [provider]  cinacalcet  (SENSIPAR ) 90 MG tablet Take 90 mg by mouth daily. 03/17/24   [provider]  diphenoxylate -atropine  (LOMOTIL ) 2.5-0.025 MG tablet Take 1 tablet by mouth 4 (four) times daily as needed for diarrhea or loose stools. 07/01/24   Geofm Delon BRAVO, NP  fentaNYL  (DURAGESIC ) 50 MCG/HR Place 1 patch onto the skin every 3 (three) days. 06/20/24   Duanne Butler DASEN, MD  gabapentin  (NEURONTIN ) 300 MG capsule TAKE 1 CAPSULE(300 MG) BY MOUTH AT BEDTIME 06/12/24   Duanne Butler DASEN, MD  hydrocortisone  (ANUSOL -HC) 2.5 % rectal cream Place rectally 4 (four) times daily. Patient taking differently: Place 1 Application rectally 4 (four) times daily as needed for hemorrhoids. 06/11/24   Evonnie Lenis, MD  lanthanum  (FOSRENOL ) 1000 MG chewable tablet Chew 2,000-3,000 mg by mouth See admin instructions. Take 3 tablets (3000 mg) by mouth with meals and take 2 tablets (2000 mg) with snacks 10/16/19   [provider]  lidocaine -prilocaine  (EMLA ) cream Apply to affected area once Patient taking differently: Apply 1 Application topically every Monday, Wednesday, and Friday with hemodialysis. Apply to affected area once 05/29/24   Kandala, Hyndavi, MD  Nebulizers (COMPRESSOR/NEBULIZER) MISC 1 Units by Does not apply route daily as needed. 05/15/24   Pearlean Manus, MD  nystatin  (MYCOSTATIN ) 100000 UNIT/ML suspension Take 5 mLs (500,000 Units total) by mouth  4 (four) times daily. Swish and swallow 07/01/24   Davonna Siad, MD  ondansetron  (ZOFRAN ) 8 MG tablet Take 1 tablet (8 mg total) by mouth every 8 (eight) hours as needed for nausea or vomiting. Start on the third day after chemotherapy. 05/29/24   Kandala, Hyndavi, MD  pantoprazole  (PROTONIX ) 40 MG tablet Take 1 tablet (40 mg total) by mouth 2 (two) times daily before a meal. 10/08/23   Rourk, Lamar HERO, MD  prochlorperazine   (COMPAZINE ) 10 MG tablet Take 1 tablet (10 mg total) by mouth every 6 (six) hours as needed for nausea or vomiting. 05/29/24   Davonna Siad, MD    Allergies: Motrin [ibuprofen] and Trodelvy  [sacituzumab govitecan -hziy]    Review of Systems  Respiratory:  Positive for cough.   Cardiovascular:  Positive for chest pain.  All other systems reviewed and are negative.   Updated Vital Signs BP 138/84   Pulse (!) 128   Temp 98.6 F (37 C) (Oral)   Resp 19   SpO2 99%   Physical Exam Vitals and nursing note reviewed.  Constitutional:      General: She is not in acute distress.    Appearance: She is not toxic-appearing.  HENT:     Right Ear: Tympanic membrane and ear canal normal.     Left Ear: Tympanic membrane and ear canal normal.  Eyes:     Conjunctiva/sclera: Conjunctivae normal.     Pupils: Pupils are equal, round, and reactive to light.  Cardiovascular:     Rate and Rhythm: Regular rhythm. Tachycardia present.     Pulses: Normal pulses.  Pulmonary:     Effort: Pulmonary effort is normal. No respiratory distress.     Breath sounds: No stridor. No wheezing.  Abdominal:     Palpations: Abdomen is soft.     Tenderness: There is no abdominal tenderness.  Musculoskeletal:     Cervical back: Normal range of motion.     Right lower leg: No edema.     Left lower leg: No edema.  Skin:    General: Skin is warm.     Capillary Refill: Capillary refill takes less than 2 seconds.  Neurological:     General: No focal deficit present.     Mental Status: She is alert.     Sensory: No sensory deficit.     Motor: No weakness.     (all labs ordered are listed, but only abnormal results are displayed) Labs Reviewed  CBC WITH DIFFERENTIAL/PLATELET - Abnormal; Notable for the following components:      Result Value   RBC 2.15 (*)    Hemoglobin 7.4 (*)    HCT 24.6 (*)    MCV 114.4 (*)    MCH 34.4 (*)    RDW 20.9 (*)    Abs Immature Granulocytes 0.08 (*)    All other components  within normal limits  BASIC METABOLIC PANEL WITH GFR - Abnormal; Notable for the following components:   Potassium 3.2 (*)    Chloride 95 (*)    Creatinine, Ser 2.89 (*)    Calcium  8.4 (*)    GFR, Estimated 18 (*)    All other components within normal limits  D-DIMER, QUANTITATIVE - Abnormal; Notable for the following components:   D-Dimer, Quant 1.69 (*)    All other components within normal limits  TROPONIN T, HIGH SENSITIVITY - Abnormal; Notable for the following components:   Troponin T High Sensitivity 70 (*)    All other components within normal limits  TROPONIN T, HIGH SENSITIVITY - Abnormal; Notable for the following components:   Troponin T High Sensitivity 71 (*)    All other components within normal limits  MAGNESIUM     EKG: EKG Interpretation Date/Time:  Friday July 18 2024 10:13:57 EST Ventricular Rate:  122 PR Interval:  133 QRS Duration:  85 QT Interval:  386 QTC Calculation: 550 R Axis:   1  Text Interpretation: Sinus tachycardia Probable left atrial enlargement Probable left ventricular hypertrophy Prolonged QT interval Compared with prior EKG from 06/29/2024 Confirmed by Gennaro Bouchard ([id number]) on 07/18/2024 10:34:48 AM  Radiology: No results found.   Procedures   Medications Ordered in the ED - No data to display                                  Medical Decision Making   Pt with hx of ESRD, CA with mets to lung and liver, currently undergoing chemo every 3 weeks and completed a full dialysis tx just PTA,  here with persistent cough for several weeks.  She has been on cough medication and taken OTC without significant improvement.  She is having some chest tightness associated with the frequent coughing.  She wears oxygen  at 2 L [place] at baseline and denies increased shortness of breath from here baseline.  No fever or hemoptysis   Pt tachycardic on arrival.  She does not appear in respir distress and I do not appreciate any rales or wheezes.  She  does not appear fluid overloaded.    Her diff dx includes but not limited to PNA, progression of her CA, CHF exacerbation, viral process, PE.    Amount and/or Complexity of Data Reviewed Labs: ordered.    Details: D dimer is elevated, delta trop is elevated but flat, no leukocytosis, her hemoglobin is somewhat lower but near her baseline.  Creat elevated, dialysis  pt Radiology: ordered.    Details: CXR w/o evidence of pleural effusion, multiple pulm nodules c/w mets   CTA w/o evidence of PE.  Bronchial wall thickening, decreasing size of pulm nodules ECG/medicine tests: ordered.    Details: Sinus tachy Discussion of management or test interpretation with external provider(s):   Patient also seen by Dr. Norlin and care plan discussed.  Initially she was tachycardic but heart rate improved.  Currently at time of discharge heart rate is 109.  Her CT angio without evidence of PE.  Persistent cough but improved during ER stay. She has tolerated crackers and po fluids here w/o difficulty,  reports feeling better  Work up without PE, some bronchial thickening I feel its worth while to start abx therapy, will address her cough.  There is no increased oxygen  demand during ER stay, she has remained on 2L which is her baseline.  I feel she is appropriate for d/c and she has close out pt f/u with PCP and with her oncologist.  She was also given strict ER return precautions.    Risk Prescription drug management.        Final diagnoses:  Chronic cough  Tachycardia    ED Discharge Orders     None          Herlinda Milling, PA-C 07/20/24 1459    Kammerer, Bouchard L, DO 07/22/24 1757

## 2024-07-20 ENCOUNTER — Encounter (HOSPITAL_COMMUNITY): Payer: Self-pay | Admitting: *Deleted

## 2024-07-20 ENCOUNTER — Other Ambulatory Visit: Payer: Self-pay

## 2024-07-20 ENCOUNTER — Emergency Department (HOSPITAL_COMMUNITY)

## 2024-07-20 ENCOUNTER — Inpatient Hospital Stay (HOSPITAL_COMMUNITY)
Admission: EM | Admit: 2024-07-20 | Discharge: 2024-07-23 | DRG: 189 | Disposition: A | Attending: Family Medicine | Admitting: Family Medicine

## 2024-07-20 DIAGNOSIS — R0603 Acute respiratory distress: Principal | ICD-10-CM

## 2024-07-20 DIAGNOSIS — J9621 Acute and chronic respiratory failure with hypoxia: Secondary | ICD-10-CM | POA: Diagnosis present

## 2024-07-20 DIAGNOSIS — J441 Chronic obstructive pulmonary disease with (acute) exacerbation: Secondary | ICD-10-CM | POA: Diagnosis present

## 2024-07-20 DIAGNOSIS — K219 Gastro-esophageal reflux disease without esophagitis: Secondary | ICD-10-CM | POA: Diagnosis present

## 2024-07-20 DIAGNOSIS — C50211 Malignant neoplasm of upper-inner quadrant of right female breast: Secondary | ICD-10-CM | POA: Diagnosis present

## 2024-07-20 DIAGNOSIS — J209 Acute bronchitis, unspecified: Secondary | ICD-10-CM | POA: Diagnosis present

## 2024-07-20 DIAGNOSIS — N186 End stage renal disease: Secondary | ICD-10-CM | POA: Diagnosis present

## 2024-07-20 DIAGNOSIS — C7802 Secondary malignant neoplasm of left lung: Secondary | ICD-10-CM | POA: Diagnosis not present

## 2024-07-20 DIAGNOSIS — D631 Anemia in chronic kidney disease: Secondary | ICD-10-CM | POA: Diagnosis present

## 2024-07-20 DIAGNOSIS — I120 Hypertensive chronic kidney disease with stage 5 chronic kidney disease or end stage renal disease: Secondary | ICD-10-CM | POA: Diagnosis not present

## 2024-07-20 DIAGNOSIS — C50111 Malignant neoplasm of central portion of right female breast: Secondary | ICD-10-CM | POA: Diagnosis not present

## 2024-07-20 DIAGNOSIS — E782 Mixed hyperlipidemia: Secondary | ICD-10-CM | POA: Diagnosis present

## 2024-07-20 DIAGNOSIS — C7801 Secondary malignant neoplasm of right lung: Secondary | ICD-10-CM | POA: Diagnosis not present

## 2024-07-20 DIAGNOSIS — R0602 Shortness of breath: Secondary | ICD-10-CM | POA: Diagnosis not present

## 2024-07-20 DIAGNOSIS — Z8249 Family history of ischemic heart disease and other diseases of the circulatory system: Secondary | ICD-10-CM | POA: Diagnosis not present

## 2024-07-20 DIAGNOSIS — Z853 Personal history of malignant neoplasm of breast: Secondary | ICD-10-CM | POA: Diagnosis not present

## 2024-07-20 DIAGNOSIS — C781 Secondary malignant neoplasm of mediastinum: Secondary | ICD-10-CM | POA: Diagnosis present

## 2024-07-20 DIAGNOSIS — R051 Acute cough: Secondary | ICD-10-CM

## 2024-07-20 DIAGNOSIS — Z992 Dependence on renal dialysis: Secondary | ICD-10-CM | POA: Diagnosis not present

## 2024-07-20 DIAGNOSIS — N2581 Secondary hyperparathyroidism of renal origin: Secondary | ICD-10-CM | POA: Diagnosis present

## 2024-07-20 DIAGNOSIS — Z833 Family history of diabetes mellitus: Secondary | ICD-10-CM | POA: Diagnosis not present

## 2024-07-20 DIAGNOSIS — D649 Anemia, unspecified: Secondary | ICD-10-CM | POA: Diagnosis not present

## 2024-07-20 DIAGNOSIS — C7800 Secondary malignant neoplasm of unspecified lung: Secondary | ICD-10-CM | POA: Diagnosis present

## 2024-07-20 DIAGNOSIS — J440 Chronic obstructive pulmonary disease with acute lower respiratory infection: Secondary | ICD-10-CM | POA: Diagnosis present

## 2024-07-20 DIAGNOSIS — C50911 Malignant neoplasm of unspecified site of right female breast: Secondary | ICD-10-CM | POA: Diagnosis present

## 2024-07-20 DIAGNOSIS — D849 Immunodeficiency, unspecified: Secondary | ICD-10-CM | POA: Diagnosis present

## 2024-07-20 DIAGNOSIS — I2510 Atherosclerotic heart disease of native coronary artery without angina pectoris: Secondary | ICD-10-CM | POA: Diagnosis present

## 2024-07-20 DIAGNOSIS — Z9981 Dependence on supplemental oxygen: Secondary | ICD-10-CM | POA: Diagnosis not present

## 2024-07-20 DIAGNOSIS — C773 Secondary and unspecified malignant neoplasm of axilla and upper limb lymph nodes: Secondary | ICD-10-CM | POA: Diagnosis present

## 2024-07-20 DIAGNOSIS — Z1152 Encounter for screening for COVID-19: Secondary | ICD-10-CM | POA: Diagnosis not present

## 2024-07-20 DIAGNOSIS — E1122 Type 2 diabetes mellitus with diabetic chronic kidney disease: Secondary | ICD-10-CM | POA: Diagnosis present

## 2024-07-20 DIAGNOSIS — Z7982 Long term (current) use of aspirin: Secondary | ICD-10-CM | POA: Diagnosis not present

## 2024-07-20 DIAGNOSIS — C50919 Malignant neoplasm of unspecified site of unspecified female breast: Secondary | ICD-10-CM | POA: Diagnosis not present

## 2024-07-20 DIAGNOSIS — I10 Essential (primary) hypertension: Secondary | ICD-10-CM | POA: Diagnosis present

## 2024-07-20 DIAGNOSIS — E119 Type 2 diabetes mellitus without complications: Secondary | ICD-10-CM

## 2024-07-20 DIAGNOSIS — I132 Hypertensive heart and chronic kidney disease with heart failure and with stage 5 chronic kidney disease, or end stage renal disease: Secondary | ICD-10-CM | POA: Diagnosis present

## 2024-07-20 LAB — CBC WITH DIFFERENTIAL/PLATELET
Abs Immature Granulocytes: 0.08 K/uL — ABNORMAL HIGH (ref 0.00–0.07)
Basophils Absolute: 0.1 K/uL (ref 0.0–0.1)
Basophils Relative: 1 %
Eosinophils Absolute: 0.2 K/uL (ref 0.0–0.5)
Eosinophils Relative: 3 %
HCT: 23.6 % — ABNORMAL LOW (ref 36.0–46.0)
Hemoglobin: 7.1 g/dL — ABNORMAL LOW (ref 12.0–15.0)
Immature Granulocytes: 1 %
Lymphocytes Relative: 8 %
Lymphs Abs: 0.7 K/uL (ref 0.7–4.0)
MCH: 34.8 pg — ABNORMAL HIGH (ref 26.0–34.0)
MCHC: 30.1 g/dL (ref 30.0–36.0)
MCV: 115.7 fL — ABNORMAL HIGH (ref 80.0–100.0)
Monocytes Absolute: 0.6 K/uL (ref 0.1–1.0)
Monocytes Relative: 7 %
Neutro Abs: 6.8 K/uL (ref 1.7–7.7)
Neutrophils Relative %: 80 %
Platelets: 255 K/uL (ref 150–400)
RBC: 2.04 MIL/uL — ABNORMAL LOW (ref 3.87–5.11)
RDW: 20.9 % — ABNORMAL HIGH (ref 11.5–15.5)
Smear Review: NORMAL
WBC: 8.5 K/uL (ref 4.0–10.5)
nRBC: 0.5 % — ABNORMAL HIGH (ref 0.0–0.2)

## 2024-07-20 LAB — COMPREHENSIVE METABOLIC PANEL WITH GFR
ALT: 26 U/L (ref 0–44)
AST: 17 U/L (ref 15–41)
Albumin: 3.2 g/dL — ABNORMAL LOW (ref 3.5–5.0)
Alkaline Phosphatase: 180 U/L — ABNORMAL HIGH (ref 38–126)
Anion gap: 19 — ABNORMAL HIGH (ref 5–15)
BUN: 25 mg/dL — ABNORMAL HIGH (ref 8–23)
CO2: 25 mmol/L (ref 22–32)
Calcium: 7.9 mg/dL — ABNORMAL LOW (ref 8.9–10.3)
Chloride: 95 mmol/L — ABNORMAL LOW (ref 98–111)
Creatinine, Ser: 7.4 mg/dL — ABNORMAL HIGH (ref 0.44–1.00)
GFR, Estimated: 6 mL/min — ABNORMAL LOW (ref >60)
Glucose, Bld: 110 mg/dL — ABNORMAL HIGH (ref 70–99)
Potassium: 3 mmol/L — ABNORMAL LOW (ref 3.5–5.1)
Sodium: 139 mmol/L (ref 135–145)
Total Bilirubin: 0.3 mg/dL (ref 0.0–1.2)
Total Protein: 6.1 g/dL — ABNORMAL LOW (ref 6.5–8.1)

## 2024-07-20 LAB — BLOOD GAS, VENOUS
Acid-Base Excess: 5.5 mmol/L — ABNORMAL HIGH (ref 0.0–2.0)
Bicarbonate: 31.1 mmol/L — ABNORMAL HIGH (ref 20.0–28.0)
Drawn by: 4237
O2 Saturation: 90.3 %
Patient temperature: 37.2
pCO2, Ven: 48 mmHg (ref 44–60)
pH, Ven: 7.42 (ref 7.25–7.43)
pO2, Ven: 58 mmHg — ABNORMAL HIGH (ref 32–45)

## 2024-07-20 LAB — PROTIME-INR
INR: 1.1 (ref 0.8–1.2)
Prothrombin Time: 14.4 s (ref 11.4–15.2)

## 2024-07-20 LAB — RESP PANEL BY RT-PCR (RSV, FLU A&B, COVID)  RVPGX2
Influenza A by PCR: NEGATIVE
Influenza B by PCR: NEGATIVE
Resp Syncytial Virus by PCR: NEGATIVE
SARS Coronavirus 2 by RT PCR: NEGATIVE

## 2024-07-20 LAB — GLUCOSE, CAPILLARY
Glucose-Capillary: 186 mg/dL — ABNORMAL HIGH (ref 70–99)
Glucose-Capillary: 182 mg/dL — ABNORMAL HIGH (ref 70–99)

## 2024-07-20 LAB — LACTIC ACID, PLASMA
Lactic Acid, Venous: 1 mmol/L (ref 0.5–1.9)
Lactic Acid, Venous: 1.3 mmol/L (ref 0.5–1.9)

## 2024-07-20 MED ORDER — HEPARIN SOD (PORK) LOCK FLUSH 100 UNIT/ML IV SOLN
INTRAVENOUS | Status: AC
  Filled 2024-07-20: qty 5

## 2024-07-20 MED ORDER — SODIUM CHLORIDE 0.9% FLUSH: 3.0000 mL | INTRAVENOUS | Status: DC | PRN

## 2024-07-20 MED ORDER — GUAIFENESIN-CODEINE 100-10 MG/5ML PO SOLN
10.0000 mL | ORAL | Status: DC | PRN
  Administered 2024-07-21 – 2024-07-22 (×7): 10 mL via ORAL
  Filled 2024-07-20 (×7): qty 10

## 2024-07-20 MED ORDER — DM-GUAIFENESIN ER 30-600 MG PO TB12
1.0000 | ORAL_TABLET | Freq: Two times a day (BID) | ORAL | Status: DC
  Administered 2024-07-20 – 2024-07-23 (×6): 1 via ORAL
  Filled 2024-07-20 (×5): qty 1

## 2024-07-20 MED ORDER — ALBUTEROL SULFATE (2.5 MG/3ML) 0.083% IN NEBU
10.0000 mg | INHALATION_SOLUTION | Freq: Once | RESPIRATORY_TRACT | Status: AC
  Administered 2024-07-20: 10 mg via RESPIRATORY_TRACT
  Filled 2024-07-20: qty 12

## 2024-07-20 MED ORDER — IPRATROPIUM-ALBUTEROL 0.5-2.5 (3) MG/3ML IN SOLN
3.0000 mL | Freq: Four times a day (QID) | RESPIRATORY_TRACT | Status: DC
  Administered 2024-07-20: 3 mL via RESPIRATORY_TRACT
  Filled 2024-07-20: qty 3

## 2024-07-20 MED ORDER — DIPHENOXYLATE-ATROPINE 2.5-0.025 MG PO TABS
1.0000 | ORAL_TABLET | Freq: Four times a day (QID) | ORAL | Status: DC | PRN
  Administered 2024-07-22 (×2): 1 via ORAL
  Filled 2024-07-20 (×2): qty 1

## 2024-07-20 MED ORDER — POLYETHYLENE GLYCOL 3350 17 G PO PACK: 17.0000 g | PACK | Freq: Every day | ORAL | Status: DC | PRN

## 2024-07-20 MED ORDER — SODIUM CHLORIDE 0.9% FLUSH
3.0000 mL | Freq: Two times a day (BID) | INTRAVENOUS | Status: DC
  Administered 2024-07-20 – 2024-07-22 (×4): 3 mL via INTRAVENOUS

## 2024-07-20 MED ORDER — LANTHANUM CARBONATE 500 MG PO CHEW: 2000.0000 mg | CHEWABLE_TABLET | ORAL | Status: DC

## 2024-07-20 MED ORDER — CINACALCET HCL 30 MG PO TABS
90.0000 mg | ORAL_TABLET | Freq: Every day | ORAL | Status: DC
  Administered 2024-07-21 – 2024-07-23 (×3): 90 mg via ORAL
  Filled 2024-07-20 (×4): qty 3

## 2024-07-20 MED ORDER — SODIUM CHLORIDE 0.9 % IV SOLN: INTRAVENOUS | Status: AC

## 2024-07-20 MED ORDER — HYDROCOD POLI-CHLORPHE POLI ER 10-8 MG/5ML PO SUER: 5.0000 mL | Freq: Two times a day (BID) | ORAL | Status: DC | PRN

## 2024-07-20 MED ORDER — GABAPENTIN 300 MG PO CAPS
300.0000 mg | ORAL_CAPSULE | Freq: Every day | ORAL | Status: DC
  Administered 2024-07-20 – 2024-07-22 (×3): 300 mg via ORAL
  Filled 2024-07-20 (×3): qty 1

## 2024-07-20 MED ORDER — ALBUTEROL SULFATE (2.5 MG/3ML) 0.083% IN NEBU: 2.5000 mg | INHALATION_SOLUTION | RESPIRATORY_TRACT | Status: DC | PRN

## 2024-07-20 MED ORDER — MIDODRINE HCL 5 MG PO TABS
2.5000 mg | ORAL_TABLET | ORAL | Status: DC
  Administered 2024-07-23: 2.5 mg via ORAL
  Filled 2024-07-20 (×2): qty 1

## 2024-07-20 MED ORDER — INSULIN ASPART 100 UNIT/ML IJ SOLN: 0.0000 [IU] | Freq: Three times a day (TID) | INTRAMUSCULAR | Status: DC

## 2024-07-20 MED ORDER — PANTOPRAZOLE SODIUM 40 MG PO TBEC
40.0000 mg | DELAYED_RELEASE_TABLET | Freq: Two times a day (BID) | ORAL | Status: DC
  Administered 2024-07-21 – 2024-07-23 (×5): 40 mg via ORAL
  Filled 2024-07-20 (×6): qty 1

## 2024-07-20 MED ORDER — SODIUM CHLORIDE 0.9% IV SOLUTION: Freq: Once | INTRAVENOUS | Status: DC

## 2024-07-20 MED ORDER — METHYLPREDNISOLONE SODIUM SUCC 125 MG IJ SOLR
125.0000 mg | Freq: Once | INTRAMUSCULAR | Status: AC
  Administered 2024-07-20: 125 mg via INTRAVENOUS
  Filled 2024-07-20: qty 2

## 2024-07-20 MED ORDER — ENSURE PLUS HIGH PROTEIN PO LIQD
237.0000 mL | Freq: Two times a day (BID) | ORAL | Status: DC
  Administered 2024-07-21 – 2024-07-22 (×3): 237 mL via ORAL

## 2024-07-20 MED ORDER — LANTHANUM CARBONATE 500 MG PO CHEW: 2000.0000 mg | CHEWABLE_TABLET | Freq: Every day | ORAL | Status: DC | PRN

## 2024-07-20 MED ORDER — LANTHANUM CARBONATE 500 MG PO CHEW
3000.0000 mg | CHEWABLE_TABLET | Freq: Three times a day (TID) | ORAL | Status: DC
  Administered 2024-07-21 – 2024-07-23 (×6): 3000 mg via ORAL
  Filled 2024-07-20 (×11): qty 6

## 2024-07-20 MED ORDER — BENZONATATE 100 MG PO CAPS
200.0000 mg | ORAL_CAPSULE | Freq: Two times a day (BID) | ORAL | Status: DC
  Administered 2024-07-20 – 2024-07-23 (×6): 200 mg via ORAL
  Filled 2024-07-20 (×6): qty 2

## 2024-07-20 MED ORDER — SODIUM CHLORIDE 0.9 % IV SOLN
500.0000 mg | INTRAVENOUS | Status: DC
  Administered 2024-07-21 – 2024-07-23 (×3): 500 mg via INTRAVENOUS
  Filled 2024-07-20 (×3): qty 5

## 2024-07-20 MED ORDER — ALPRAZOLAM 0.5 MG PO TABS
0.5000 mg | ORAL_TABLET | Freq: Two times a day (BID) | ORAL | Status: DC | PRN
  Administered 2024-07-21: 0.5 mg via ORAL
  Filled 2024-07-20: qty 1

## 2024-07-20 MED ORDER — DIALYVITE 800-ZINC 15 0.8 MG PO TABS: 1.0000 | ORAL_TABLET | Freq: Every day | ORAL | Status: DC

## 2024-07-20 MED ORDER — DM-GUAIFENESIN ER 30-600 MG PO TB12
1.0000 | ORAL_TABLET | Freq: Two times a day (BID) | ORAL | Status: DC
  Filled 2024-07-20: qty 1

## 2024-07-20 MED ORDER — BISACODYL 10 MG RE SUPP: 10.0000 mg | Freq: Every day | RECTAL | Status: DC | PRN

## 2024-07-20 MED ORDER — ENOXAPARIN SODIUM 30 MG/0.3ML IJ SOSY: 30.0000 mg | PREFILLED_SYRINGE | INTRAMUSCULAR | Status: DC

## 2024-07-20 MED ORDER — TRAZODONE HCL 50 MG PO TABS
50.0000 mg | ORAL_TABLET | Freq: Every evening | ORAL | Status: DC | PRN
  Administered 2024-07-22: 50 mg via ORAL
  Filled 2024-07-20: qty 1

## 2024-07-20 MED ORDER — CHLORHEXIDINE GLUCONATE CLOTH 2 % EX PADS
6.0000 | MEDICATED_PAD | Freq: Every day | CUTANEOUS | Status: DC
  Administered 2024-07-21: 6 via TOPICAL

## 2024-07-20 MED ORDER — RENA-VITE PO TABS
1.0000 | ORAL_TABLET | Freq: Every day | ORAL | Status: DC
  Administered 2024-07-20 – 2024-07-22 (×3): 1 via ORAL
  Filled 2024-07-20 (×3): qty 1

## 2024-07-20 MED ORDER — SODIUM CHLORIDE 0.9 % IV SOLN
2.0000 g | Freq: Once | INTRAVENOUS | Status: AC
  Administered 2024-07-20: 2 g via INTRAVENOUS
  Filled 2024-07-20: qty 20

## 2024-07-20 MED ORDER — SODIUM CHLORIDE 0.9% FLUSH
3.0000 mL | Freq: Two times a day (BID) | INTRAVENOUS | Status: DC
  Administered 2024-07-20 – 2024-07-22 (×4): 3 mL via INTRAVENOUS

## 2024-07-20 MED ORDER — ONDANSETRON HCL 4 MG PO TABS: 4.0000 mg | ORAL_TABLET | Freq: Four times a day (QID) | ORAL | Status: DC | PRN

## 2024-07-20 MED ORDER — SODIUM CHLORIDE 0.9 % IV SOLN: INTRAVENOUS | Status: AC | PRN

## 2024-07-20 MED ORDER — ADULT MULTIVITAMIN W/MINERALS CH
1.0000 | ORAL_TABLET | Freq: Every day | ORAL | Status: DC
  Administered 2024-07-20: 1 via ORAL
  Filled 2024-07-20: qty 1

## 2024-07-20 MED ORDER — ALPRAZOLAM 0.5 MG PO TABS
0.5000 mg | ORAL_TABLET | Freq: Once | ORAL | Status: AC
  Administered 2024-07-20: 0.5 mg via ORAL
  Filled 2024-07-20: qty 1

## 2024-07-20 MED ORDER — HEPARIN SODIUM (PORCINE) 5000 UNIT/ML IJ SOLN
5000.0000 [IU] | Freq: Three times a day (TID) | INTRAMUSCULAR | Status: DC
  Administered 2024-07-20 – 2024-07-23 (×8): 5000 [IU] via SUBCUTANEOUS
  Filled 2024-07-20 (×8): qty 1

## 2024-07-20 MED ORDER — LACTATED RINGERS IV SOLN: INTRAVENOUS | Status: DC

## 2024-07-20 MED ORDER — METHYLPREDNISOLONE SODIUM SUCC 40 MG IJ SOLR
40.0000 mg | Freq: Two times a day (BID) | INTRAMUSCULAR | Status: DC
  Administered 2024-07-21 – 2024-07-23 (×5): 40 mg via INTRAVENOUS
  Filled 2024-07-20 (×5): qty 1

## 2024-07-20 MED ORDER — ONDANSETRON HCL 4 MG/2ML IJ SOLN: 4.0000 mg | Freq: Four times a day (QID) | INTRAMUSCULAR | Status: DC | PRN

## 2024-07-20 MED ORDER — ACETAMINOPHEN 325 MG PO TABS
650.0000 mg | ORAL_TABLET | Freq: Four times a day (QID) | ORAL | Status: DC | PRN
  Administered 2024-07-21: 650 mg via ORAL
  Filled 2024-07-20: qty 2

## 2024-07-20 MED ORDER — ACETAMINOPHEN 650 MG RE SUPP: 650.0000 mg | Freq: Four times a day (QID) | RECTAL | Status: DC | PRN

## 2024-07-20 MED ORDER — IPRATROPIUM-ALBUTEROL 0.5-2.5 (3) MG/3ML IN SOLN
3.0000 mL | Freq: Three times a day (TID) | RESPIRATORY_TRACT | Status: DC
  Administered 2024-07-21 – 2024-07-22 (×3): 3 mL via RESPIRATORY_TRACT
  Filled 2024-07-20 (×3): qty 3

## 2024-07-20 MED ORDER — ASPIRIN 81 MG PO TBEC
81.0000 mg | DELAYED_RELEASE_TABLET | Freq: Every day | ORAL | Status: DC
  Administered 2024-07-21 – 2024-07-23 (×3): 81 mg via ORAL
  Filled 2024-07-20 (×4): qty 1

## 2024-07-20 MED ORDER — LABETALOL HCL 5 MG/ML IV SOLN: 10.0000 mg | INTRAVENOUS | Status: DC | PRN

## 2024-07-20 MED ORDER — INSULIN ASPART 100 UNIT/ML IJ SOLN: 0.0000 [IU] | Freq: Every day | INTRAMUSCULAR | Status: DC

## 2024-07-20 MED ORDER — SODIUM CHLORIDE 0.9 % IV SOLN
500.0000 mg | Freq: Once | INTRAVENOUS | Status: AC
  Administered 2024-07-20: 500 mg via INTRAVENOUS
  Filled 2024-07-20: qty 5

## 2024-07-20 MED ORDER — BENZONATATE 100 MG PO CAPS
200.0000 mg | ORAL_CAPSULE | Freq: Once | ORAL | Status: AC
  Administered 2024-07-20: 200 mg via ORAL
  Filled 2024-07-20: qty 2

## 2024-07-20 NOTE — ED Provider Notes (Signed)
 [place] EMERGENCY DEPARTMENT AT [facility] Provider Note   CSN: [id number] Arrival date & time: 07/20/24  1334     Patient presents with: Cough   Megan [name] is a 63 y.o. female.    Cough  63 year old female presenting today in acute respiratory distress with frequent coughing and tachycardia.  This patient does have a history of diabetes, she is on dialysis Monday Wednesday and Friday for chronic renal failure, she is also undergoing chemotherapy for stage IV metastatic disease to the lung and the liver.  The patient has been coughing for several weeks, she is on 2 L of oxygen  constantly, she was not having shortness of breath at her last visit 2 days ago however at that visit was found to have a CT scan that showed no pulmonary embolism but there was moderate right greater than left diffuse bronchial wall thickening, innumerable bilateral pulmonary nodules, lymphadenopathy.  Since being placed on Augmentin  and Zithromax  and going home with a hydrocodone  containing cough syrup the patient has worsened and is now more short of breath, coughing more and having severe distress at home.    Prior to Admission medications   Medication Sig Start Date End Date Taking? Authorizing Provider  acetaminophen  (TYLENOL ) 500 MG tablet Take 1,000 mg by mouth every 8 (eight) hours as needed for moderate pain (pain score 4-6).    [provider]  albuterol  (PROVENTIL ) (2.5 MG/3ML) 0.083% nebulizer solution Take 3 mLs (2.5 mg total) by nebulization every 4 (four) hours as needed for wheezing or shortness of breath. 05/15/24 05/15/25  Pearlean Manus, MD  albuterol  (VENTOLIN  HFA) 108 (90 Base) MCG/ACT inhaler Inhale 2 puffs into the lungs every 6 (six) hours as needed for wheezing or shortness of breath. 05/15/24   Pearlean Manus, MD  amoxicillin -clavulanate (AUGMENTIN ) 875-125 MG tablet Take 1 tablet by mouth every 12 (twelve) hours. 07/18/24   Triplett, Tammy, PA-C  aspirin  EC  81 MG tablet Take 81 mg by mouth daily.    [provider]  azithromycin  (ZITHROMAX ) 250 MG tablet Take 1 tablet (250 mg total) by mouth daily. Take first 2 tablets together, then 1 every day until finished. 07/18/24   Triplett, Tammy, PA-C  B Complex-C-Zn-Folic Acid  (DIALYVITE  800-ZINC  15) 0.8 MG TABS Take 1 tablet by mouth daily. 06/19/22   [provider]  chlorpheniramine-HYDROcodone  (TUSSIONEX) 10-8 MG/5ML Take 5 mLs by mouth every 12 (twelve) hours as needed for cough. 07/18/24   Triplett, Tammy, PA-C  cinacalcet  (SENSIPAR ) 90 MG tablet Take 90 mg by mouth daily. 03/17/24   [provider]  Darbepoetin Alfa  (ARANESP , ALBUMIN  FREE, IJ) 200 mcg. 07/04/24 07/03/25  [provider]  diphenoxylate -atropine  (LOMOTIL ) 2.5-0.025 MG tablet Take 1 tablet by mouth 4 (four) times daily as needed for diarrhea or loose stools. 07/01/24   Geofm Delon BRAVO, NP  gabapentin  (NEURONTIN ) 300 MG capsule TAKE 1 CAPSULE(300 MG) BY MOUTH AT BEDTIME 06/12/24   Duanne Butler DASEN, MD  lanthanum  (FOSRENOL ) 1000 MG chewable tablet Chew 2,000-3,000 mg by mouth See admin instructions. Take 3 tablets (3000 mg) by mouth with meals and take 2 tablets (2000 mg) with snacks 10/16/19   [provider]  lidocaine -prilocaine  (EMLA ) cream Apply to affected area once Patient taking differently: Apply 1 Application topically every Monday, Wednesday, and Friday with hemodialysis. Apply to affected area once 05/29/24   Kandala, Hyndavi, MD  midodrine  (PROAMATINE ) 2.5 MG tablet Take 2.5 mg by mouth See admin instructions. Monday,Wednesday and Friday Only take  on dialysis days    [provider]  Nebulizers (COMPRESSOR/NEBULIZER) MISC 1 Units by Does not apply route daily as needed. 05/15/24   Pearlean Manus, MD  nystatin  (MYCOSTATIN ) 100000 UNIT/ML suspension Take 5 mLs (500,000 Units total) by mouth 4 (four) times daily. Swish and swallow 07/01/24   Davonna Siad, MD  ondansetron  (ZOFRAN )  8 MG tablet Take 1 tablet (8 mg total) by mouth every 8 (eight) hours as needed for nausea or vomiting. Start on the third day after chemotherapy. 05/29/24   Kandala, Hyndavi, MD  pantoprazole  (PROTONIX ) 40 MG tablet Take 1 tablet (40 mg total) by mouth 2 (two) times daily before a meal. 10/08/23   Rourk, Lamar HERO, MD    Allergies: Motrin [ibuprofen] and Trodelvy  [sacituzumab govitecan -hziy]    Review of Systems  Respiratory:  Positive for cough.   All other systems reviewed and are negative.   Updated Vital Signs BP 126/80   Pulse (!) 123   Temp 98.9 F (37.2 C) (Temporal)   Resp (!) 30   Ht 1.575 m (5' 2)   Wt 78.5 kg   SpO2 100%   BMI 31.64 kg/m   Physical Exam Vitals and nursing note reviewed.  Constitutional:      General: She is in acute distress.     Appearance: She is well-developed. She is ill-appearing.  HENT:     Head: Normocephalic and atraumatic.     Mouth/Throat:     Pharynx: No oropharyngeal exudate.  Eyes:     General: No scleral icterus.       Right eye: No discharge.        Left eye: No discharge.     Conjunctiva/sclera: Conjunctivae normal.     Pupils: Pupils are equal, round, and reactive to light.  Neck:     Thyroid : No thyromegaly.     Vascular: No JVD.  Cardiovascular:     Rate and Rhythm: Regular rhythm. Tachycardia present.     Heart sounds: Normal heart sounds. No murmur heard.    No friction rub. No gallop.     Comments: Port-A-Cath in place, vascular access for dialysis in the left arm with good thrill Pulmonary:     Effort: Respiratory distress present.     Breath sounds: Rales present. No wheezing.  Abdominal:     General: Bowel sounds are normal. There is no distension.     Palpations: Abdomen is soft. There is no mass.     Tenderness: There is no abdominal tenderness.  Musculoskeletal:        General: No tenderness. Normal range of motion.     Cervical back: Normal range of motion and neck supple.     Right lower leg: No edema.      Left lower leg: No edema.  Lymphadenopathy:     Cervical: No cervical adenopathy.  Skin:    General: Skin is warm and dry.     Findings: No erythema or rash.  Neurological:     Mental Status: She is alert.     Coordination: Coordination normal.  Psychiatric:        Behavior: Behavior normal.     (all labs ordered are listed, but only abnormal results are displayed) Labs Reviewed  COMPREHENSIVE METABOLIC PANEL WITH GFR - Abnormal; Notable for the following components:      Result Value   Potassium 3.0 (*)    Chloride 95 (*)    Glucose, Bld 110 (*)    BUN 25 (*)  Creatinine, Ser 7.40 (*)    Calcium  7.9 (*)    Total Protein 6.1 (*)    Albumin  3.2 (*)    Alkaline Phosphatase 180 (*)    GFR, Estimated 6 (*)    Anion gap 19 (*)    All other components within normal limits  CBC WITH DIFFERENTIAL/PLATELET - Abnormal; Notable for the following components:   RBC 2.04 (*)    Hemoglobin 7.1 (*)    HCT 23.6 (*)    MCV 115.7 (*)    MCH 34.8 (*)    RDW 20.9 (*)    nRBC 0.5 (*)    Abs Immature Granulocytes 0.08 (*)    All other components within normal limits  BLOOD GAS, VENOUS - Abnormal; Notable for the following components:   pO2, Ven 58 (*)    Bicarbonate 31.1 (*)    Acid-Base Excess 5.5 (*)    All other components within normal limits  RESP PANEL BY RT-PCR (RSV, FLU A&B, COVID)  RVPGX2  CULTURE, BLOOD (ROUTINE X 2)  CULTURE, BLOOD (ROUTINE X 2)  LACTIC ACID, PLASMA  PROTIME-INR  LACTIC ACID, PLASMA    EKG: None  Radiology: DG Chest Port 1 View Result Date: 07/20/2024 CLINICAL DATA:  Cough and shortness of breath, history of metastatic breast cancer EXAM: PORTABLE CHEST 1 VIEW COMPARISON:  07/18/2024 FINDINGS: Single frontal view of the chest demonstrates stable right chest wall port. Cardiac silhouette is unremarkable. Innumerable bilateral pulmonary metastases are again noted, without significant change since the previous exam. No new airspace disease, effusion,  or pneumothorax. No acute displaced fracture. IMPRESSION: 1. Stable diffuse pulmonary metastases. 2. No acute airspace disease. Electronically Signed   By: Ozell Daring M.D.   On: 07/20/2024 14:37     Procedures   Medications Ordered in the ED  lactated ringers  infusion ( Intravenous New Bag/Given 07/20/24 1504)  azithromycin  (ZITHROMAX ) 500 mg in sodium chloride  0.9 % 250 mL IVPB (500 mg Intravenous New Bag/Given 07/20/24 1502)  heparin  lock flush 100 UNIT/ML injection (has no administration in time range)  albuterol  (PROVENTIL ,VENTOLIN ) solution continuous neb (has no administration in time range)  methylPREDNISolone  sodium succinate (SOLU-MEDROL ) 125 mg/2 mL injection 125 mg (has no administration in time range)  cefTRIAXone  (ROCEPHIN ) 2 g in sodium chloride  0.9 % 100 mL IVPB (0 g Intravenous Stopped 07/20/24 1503)                                    Medical Decision Making Amount and/or Complexity of Data Reviewed Labs: ordered. Radiology: ordered.  Risk Prescription drug management.    This patient presents to the ED for concern of respiratory distress, this involves an extensive number of treatment options, and is a complaint that carries with it a high risk of complications and morbidity.  The differential diagnosis includes sepsis, pneumothorax, pleural effusion, severe bronchitis   Co morbidities / Chronic conditions that complicate the patient evaluation  Metastatic cancer   Additional history obtained:  Additional history obtained from EMR External records from outside source obtained and reviewed including medical record including prior imaging studies   Lab Tests:  I Ordered, and personally interpreted labs.  The pertinent results include: No leukocytosis, chronic anemia with a hemoglobin of 7.1, very close to baseline.  Metabolic panel shows hypokalemia of 3.0, creatinine of 7.4 consistent with chronic renal failure, lactate of 1.0 and a VBG showing no  acidosis   Imaging  Studies ordered:  I ordered imaging studies including chest x-ray I independently visualized and interpreted imaging which showed diffuse metastatic disease, no other obvious new infiltrates pneumothorax or effusion I agree with the radiologist interpretation   Cardiac Monitoring: / EKG:  The patient was maintained on a cardiac monitor.  I personally viewed and interpreted the cardiac monitored which showed an underlying rhythm of: Sinus tachycardia   Problem List / ED Course / Critical interventions / Medication management  This patient appears to be in respiratory distress, despite oxygen  the patient continues to breathe hard, she is tachycardic to 120 to 130 bpm, she does not have any signs of volume overload or hyperkalemia, she continues to be in respiratory distress thus medications were given including the following I ordered medication including Solu-Medrol , albuterol , Rocephin  and Zithromax  as there is concern that this could be progressive pneumonia, could also just be severe bronchitis Reevaluation of the patient after these medicines showed that the patient minimal improvement, continues to be tachycardic to 125 beats a minute I have reviewed the patients home medicines and have made adjustments as needed   Consultations Obtained:  I requested consultation with the hospitalist,  and discussed lab and imaging findings as well as pertinent plan - they recommend: Admission   Social Determinants of Health:  Metastatic cancer   Test / Admission - Considered:  Admit to hospital      Final diagnoses:  Acute respiratory distress  Stage 5 chronic kidney disease on chronic dialysis (HCC)  Chronic anemia  Acute cough    ED Discharge Orders     None          Cleotilde Rogue, MD 07/20/24 [phone]

## 2024-07-20 NOTE — Assessment & Plan Note (Signed)
--   Imaging studies show with pulmonary metastases, mediastinal, hilar and axillary lymphadenopathy related to underlying breast cancer with pulmonary metastases --status post bilateral mastectomies -Interval decrease in size of innumerable bilateral pulmonary nodules, lobular right pleural thickening, and left axillary and mediastinal lymphadenopathy,  Slightly decreased cutaneous thickening of the left breast/anterior chest wall consistent with treatment response. - Outpatient follow-up with oncologist advised -Palliative care consult for goals of care requested

## 2024-07-20 NOTE — ED Triage Notes (Signed)
 Pt with cough and SOB, pt is CA and chemo-last back in Nov. Pt states she was just here on Friday for same and never went away.

## 2024-07-20 NOTE — Assessment & Plan Note (Signed)
 Hgb down to 7.1 -Transfuse 1 unit of PRBC with HD in a.m. -Defer decision on ESA/Procrit to nephrology team

## 2024-07-20 NOTE — Assessment & Plan Note (Signed)
-   Continue HD per MWF schedule -Nephrology consult requested

## 2024-07-20 NOTE — Assessment & Plan Note (Signed)
-   Last A1c 5.3 reflecting excellent diabetic control Use Novolog /Humalog Sliding scale insulin  with Accu-Cheks/Fingersticks as ordered

## 2024-07-20 NOTE — Assessment & Plan Note (Signed)
--   In the setting of pulmonary metastasis- -CTA chest from 25 2025 and chest x-ray from 07/20/2024 noted - Patient was seen in the ED on 07/19/2023 with similar complaints CTA chest at that time was without PE there was concerns for bronchitis patient was treated with Augmentin  and azithromycin  as well as antitussives however she returns today with persistent respiratory symptoms specially cough and dyspnea - No missed HD sessions -Patient with persistent cough, wheezing and respiratory distress -Continue supplemental oxygen --baseline she requires 2 L at home currently up to 4 L. -IV steroids, azithromycin  and bronchodilators and mucolytic/antitussives as ordered

## 2024-07-20 NOTE — Sepsis Progress Note (Signed)
 Sepsis protocol is being followed by [facility].

## 2024-07-20 NOTE — Assessment & Plan Note (Signed)
-   May use IV labetalol  as needed

## 2024-07-20 NOTE — H&P (Addendum)
 History and Physical   Patient: Megan [name] FMW:[id number] DOB: 08-16-1960 DOA: 07/20/2024 DOS: the patient was seen and examined on 07/20/2024 PCP: Duanne Butler DASEN, MD  Patient coming from: Home  Chief Complaint:  Chief Complaint  Patient presents with   Cough   HPI: [name] is a 63 y.o. female with medical history significant history of  right-sided breast cancer with pulmonary metastases, mediastinal, hilar and axillary lymphadenopathy related to underlying breast cancer with pulmonary metastases --status post bilateral mastectomies with on chronic hypoxic respiratory failure on 2 L at home chronically , h/o ESRD on HD MWF, chronic diastolic CHF,  hypertension, dyslipidemia, DM2,  and GERD presents to the ED for difficulty in the last 48 hours with concerns about persistent cough and worsening respiratory distress. - Additional history obtained from patient's husband at bedside - Patient was seen in the ED on 07/19/2023 with similar complaints CTA chest at that time was without PE there was concerns for bronchitis patient was treated with Augmentin  and azithromycin  as well as antitussives however she returns today with persistent respiratory symptoms specially cough and dyspnea - No missed HD sessions - Chest x-ray today with diffuse pulmonary metastasis otherwise no new acute cardiopulmonary findings - CTA chest 09/08/2023 without acute PE, there is concerns for bronchitis as well as innumerable bilateral pulmonary nodules and metastatic findings - WBC 8.5 hemoglobin 7.1 platelets 255 - Potassium is 3.0 sodium is 139 chloride is 95 creatinine 7.4 alk phos 180 otherwise LFTs are not elevated - Lactic acid 1.0 repeat 1.3 - COVID flu and RSV negative  Review of Systems: As mentioned in the history of present illness. All other systems reviewed and are negative. Past Medical History:  Diagnosis Date   (HFpEF) heart failure with preserved ejection fraction (HCC)    a.  01/2019 Echo: EF 55-60%, mild conc LVH. DD.  Torn MV chordae.   Anemia    Atypical chest pain    a. 08/2018 MV: EF 59%, no ischemia; b. 02/2019 Cath: nonobs dzs.   Blood transfusion without reported diagnosis    Breast cancer (HCC) 10/12/2020   Cataract    ESRD (end stage renal disease) on dialysis [facility])    a. HD T, T, S   Essential hypertension, benign    GERD (gastroesophageal reflux disease)    Headache    Hemorrhoids    Mixed hyperlipidemia    Morbid obesity (HCC)    Non-obstructive CAD (coronary artery disease)    a. 02/2019 CathL LM nl, LAD 80m, LCX nl, RCA 25p, EF 55-65%.   PONV (postoperative nausea and vomiting)    Renal insufficiency    S/P colonoscopy 08/2009   Dr. Rollin: sessile polyp (benign lymphoid), large hemorrhoids, repeat 5-10 years   Temporal arteritis (HCC)    Type 2 diabetes mellitus (HCC)    Wears glasses    Past Surgical History:  Procedure Laterality Date   A/V SHUNT INTERVENTION Left 04/16/2024   Procedure: A/V SHUNT INTERVENTION;  Surgeon: Pearline Norman RAMAN, MD;  Location: [facility];  Service: Cardiovascular;  Laterality: Left;   ABDOMINAL HYSTERECTOMY     APPENDECTOMY     ARTERY BIOPSY N/A 05/09/2018   Procedure: RIGHT TEMPORAL ARTERY BIOPSY;  Surgeon: Kimble Agent, MD;  Location: [facility];  Service: General;  Laterality: N/A;   BIOPSY  10/04/2023   Procedure: BIOPSY;  Surgeon: Shaaron Lamar HERO, MD;  Location: [facility];  Service: Endoscopy;;   BREAST BIOPSY Right 06/15/2020   Procedure: RIGHT  BREAST BIOPSY;  Surgeon: Mavis Anes, MD;  Location: [facility];  Service: General;  Laterality: Right;   CATARACT EXTRACTION W/PHACO Left 02/09/2017   Procedure: CATARACT EXTRACTION PHACO AND INTRAOCULAR LENS PLACEMENT LEFT EYE;  Surgeon: Perley Hamilton, MD;  Location: [facility];  Service: Ophthalmology;  Laterality: Left;  CDE: 4.89   CATARACT EXTRACTION W/PHACO Right 06/04/2017   Procedure: CATARACT EXTRACTION PHACO AND INTRAOCULAR LENS PLACEMENT (IOC);  Surgeon: Perley Hamilton, MD;  Location: [facility];  Service: Ophthalmology;  Laterality: Right;  CDE: 4.12   CHOLECYSTECTOMY  09/29/2011   Procedure: LAPAROSCOPIC CHOLECYSTECTOMY;  Surgeon: Anes DELENA Mavis, MD;  Location: [facility];  Service: General;  Laterality: N/A;   COLONOSCOPY  08/2009   Dr. Rollin: sessile polyp (benign lymphoid), large hemorrhoids, repeat 5-10 years   COLONOSCOPY N/A 06/12/2016   prominent hemorrhoids   COLONOSCOPY WITH PROPOFOL  N/A 06/16/2021   Procedure: COLONOSCOPY WITH PROPOFOL ;  Surgeon: Shaaron Lamar HERO, MD;  [facility];  Service: Endoscopy;  Laterality: N/A;  9:30am (dialysis pt)   ESOPHAGOGASTRODUODENOSCOPY  09/05/2011   MFM:Dfjoo hiatal hernia; remainder of exam normal. No explanation for patient's abdominal pain with today's examination   ESOPHAGOGASTRODUODENOSCOPY N/A 12/17/2013   Dr. Shaaron: gastric erythema, erosion, mild chronic inflammation on path    ESOPHAGOGASTRODUODENOSCOPY N/A 12/31/2023   Procedure: EGD (ESOPHAGOGASTRODUODENOSCOPY);  Surgeon: Wilhelmenia Aloha Raddle., MD;  Location: [facility];  Service: Gastroenterology;  Laterality: N/A;   ESOPHAGOGASTRODUODENOSCOPY (EGD) WITH PROPOFOL  N/A 06/16/2021   Procedure: ESOPHAGOGASTRODUODENOSCOPY (EGD) WITH PROPOFOL ;  Surgeon: Shaaron Lamar HERO, MD;  [facility];  Service: Endoscopy;  Laterality: N/A;   ESOPHAGOGASTRODUODENOSCOPY (EGD) WITH PROPOFOL  N/A 10/04/2023   Procedure: ESOPHAGOGASTRODUODENOSCOPY (EGD) WITH PROPOFOL ;  Surgeon: Shaaron Lamar HERO, MD;  [facility];  Service: Endoscopy;  Laterality: N/A;  200pm, ok rm 1-2, pt knows to arrive at 6:45   EUS N/A 12/31/2023   Procedure: ULTRASOUND, UPPER GI TRACT, ENDOSCOPIC;  Surgeon: Wilhelmenia Aloha Raddle., MD;  Location: [facility];  Service: Gastroenterology;  Laterality: N/A;   EXCISION OF BREAST BIOPSY Right 10/12/2020   Procedure: EXCISION OF RIGHT BREAST BIOPSY;  Surgeon: Mavis Anes, MD;  Location: [facility];  Service: General;  Laterality:  Right;   IR DIALY SHUNT INTRO NEEDLE/INTRACATH INITIAL W/IMG LEFT Left 07/17/2023   LAPAROSCOPIC APPENDECTOMY  09/29/2011   Procedure: APPENDECTOMY LAPAROSCOPIC;  Surgeon: Anes DELENA Mavis, MD;  Location: [facility];  Service: General;;  incidental appendectomy   LEFT HEART CATH AND CORONARY ANGIOGRAPHY N/A 02/28/2019   Procedure: LEFT HEART CATH AND CORONARY ANGIOGRAPHY;  Surgeon: Dann Candyce RAMAN, MD;  [facility];  Service: Cardiovascular;  Laterality: N/A;   MASS EXCISION Right 01/18/2023   Procedure: EXCISION MASS, RIGHT CHEST S/P MASTECTOMY;  Surgeon: Mavis Anes, MD;  Location: [facility];  Service: General;  Laterality: Right;   MASTECTOMY MODIFIED RADICAL Right 02/18/2020   Procedure: MASTECTOMY MODIFIED RADICAL;  Surgeon: Mavis Anes, MD;  Location: [facility];  Service: General;  Laterality: Right;   MASTECTOMY, PARTIAL Right 07/13/2020   Procedure: RIGHT PARTIAL MASTECTOMY;  Surgeon: Mavis Anes, MD;  Location: [facility];  Service: General;  Laterality: Right;   PARTIAL MASTECTOMY WITH NEEDLE LOCALIZATION AND AXILLARY SENTINEL LYMPH NODE BX Right 09/18/2018   Procedure: RIGHT PARTIAL MASTECTOMY AFTER NEEDLE LOCALIZATION, SENTINEL LYMPH NODE BIOPSY RIGHT AXILLA;  Surgeon: Mavis Anes, MD;  Location: [facility];  Service: General;  Laterality: Right;   POLYPECTOMY  06/16/2021   Procedure: POLYPECTOMY;  Surgeon: Shaaron Lamar  M, MD;  [facility];  Service: Endoscopy;;   PORT-A-CATH REMOVAL Right 11/29/2023   Procedure: REMOVAL PORT-A-CATH;  Surgeon: Mavis Anes, MD;  Location: [facility];  Service: General;  Laterality: Right;  MINOR PROCEDURE ROOM   PORTACATH PLACEMENT Right 06/07/2023   Procedure: INSERTION PORT-A-CATH, RIGHT  (DIALYSIS ACCESS ON LEFT);  Surgeon: Mavis Anes, MD;  Location: [facility];  Service: General;  Laterality: Right;   PORTACATH PLACEMENT Right 04/03/2024   Procedure: INSERTION, TUNNELED CENTRAL VENOUS DEVICE, WITH PORT;  Surgeon: Mavis Anes, MD;   Location: [facility];  Service: General;  Laterality: Right;   SIMPLE MASTECTOMY WITH AXILLARY SENTINEL NODE BIOPSY Left 06/15/2020   Procedure: LEFT SIMPLE MASTECTOMY;  Surgeon: Mavis Anes, MD;  Location: [facility];  Service: General;  Laterality: Left;   Social History:  reports that she has never smoked. She has never been exposed to tobacco smoke. She has never used smokeless tobacco. She reports that she does not drink alcohol and does not use drugs.  Allergies  Allergen Reactions   Motrin [Ibuprofen] Other (See Comments)    End stage renal disease    Trodelvy  [Sacituzumab Govitecan -Hziy] Other (See Comments)    Wheezing     Family History  Problem Relation Age of Onset   Hypertension Mother    Coronary artery disease Mother    Diabetes Mother    Heart attack Father    Hypertension Sister    Coronary artery disease Sister    Hypertension Brother    Colon cancer Brother    Heart attack Maternal Grandmother    Heart attack Maternal Grandfather    Heart attack Paternal Grandmother    Heart attack Paternal Grandfather    Hypertension Son    Heart attack Maternal Aunt    Hypertension Maternal Aunt    Diabetes Maternal Aunt    Heart attack Maternal Uncle    Hypertension Maternal Uncle    Diabetes Maternal Uncle    Heart attack Paternal Aunt    Hypertension Paternal Aunt    Diabetes Paternal Aunt    Heart attack Paternal Uncle    Hypertension Paternal Uncle    Diabetes Paternal Uncle     Prior to Admission medications   Medication Sig Start Date End Date Taking? Authorizing Provider  acetaminophen  (TYLENOL ) 500 MG tablet Take 1,000 mg by mouth every 8 (eight) hours as needed for moderate pain (pain score 4-6).    [provider]  albuterol  (PROVENTIL ) (2.5 MG/3ML) 0.083% nebulizer solution Take 3 mLs (2.5 mg total) by nebulization every 4 (four) hours as needed for wheezing or shortness of breath. 05/15/24 05/15/25  Pearlean Manus, MD  albuterol  (VENTOLIN  HFA) 108  (90 Base) MCG/ACT inhaler Inhale 2 puffs into the lungs every 6 (six) hours as needed for wheezing or shortness of breath. 05/15/24   Pearlean Manus, MD  amoxicillin -clavulanate (AUGMENTIN ) 875-125 MG tablet Take 1 tablet by mouth every 12 (twelve) hours. 07/18/24   Triplett, Tammy, PA-C  aspirin  EC 81 MG tablet Take 81 mg by mouth daily.    [provider]  azithromycin  (ZITHROMAX ) 250 MG tablet Take 1 tablet (250 mg total) by mouth daily. Take first 2 tablets together, then 1 every day until finished. 07/18/24   Triplett, Tammy, PA-C  B Complex-C-Zn-Folic Acid  (DIALYVITE  800-ZINC  15) 0.8 MG TABS Take 1 tablet by mouth daily. 06/19/22   [provider]  chlorpheniramine-HYDROcodone  (TUSSIONEX) 10-8 MG/5ML Take 5 mLs by mouth every 12 (twelve) hours as needed for cough. 07/18/24  Triplett, Tammy, PA-C  cinacalcet  (SENSIPAR ) 90 MG tablet Take 90 mg by mouth daily. 03/17/24   [provider]  Darbepoetin Alfa  (ARANESP , ALBUMIN  FREE, IJ) 200 mcg. 07/04/24 07/03/25  [provider]  diphenoxylate -atropine  (LOMOTIL ) 2.5-0.025 MG tablet Take 1 tablet by mouth 4 (four) times daily as needed for diarrhea or loose stools. 07/01/24   Geofm Delon BRAVO, NP  gabapentin  (NEURONTIN ) 300 MG capsule TAKE 1 CAPSULE(300 MG) BY MOUTH AT BEDTIME 06/12/24   Duanne Butler DASEN, MD  lanthanum  (FOSRENOL ) 1000 MG chewable tablet Chew 2,000-3,000 mg by mouth See admin instructions. Take 3 tablets (3000 mg) by mouth with meals and take 2 tablets (2000 mg) with snacks 10/16/19   [provider]  lidocaine -prilocaine  (EMLA ) cream Apply to affected area once Patient taking differently: Apply 1 Application topically every Monday, Wednesday, and Friday with hemodialysis. Apply to affected area once 05/29/24   Kandala, Hyndavi, MD  midodrine  (PROAMATINE ) 2.5 MG tablet Take 2.5 mg by mouth See admin instructions. Monday,Wednesday and Friday Only take on dialysis days    [provider]   Nebulizers (COMPRESSOR/NEBULIZER) MISC 1 Units by Does not apply route daily as needed. 05/15/24   Pearlean Manus, MD  nystatin  (MYCOSTATIN ) 100000 UNIT/ML suspension Take 5 mLs (500,000 Units total) by mouth 4 (four) times daily. Swish and swallow 07/01/24   Davonna Siad, MD  ondansetron  (ZOFRAN ) 8 MG tablet Take 1 tablet (8 mg total) by mouth every 8 (eight) hours as needed for nausea or vomiting. Start on the third day after chemotherapy. 05/29/24   Kandala, Hyndavi, MD  pantoprazole  (PROTONIX ) 40 MG tablet Take 1 tablet (40 mg total) by mouth 2 (two) times daily before a meal. 10/08/23   Shaaron Lamar HERO, MD    Physical Exam: Vitals:   07/20/24 1558 07/20/24 1750 07/20/24 1800 07/20/24 2007  BP: 132/80 (!) 166/77 (!) 166/72   Pulse:  (!) 142 (!) 134   Resp:  (!) 34 18   Temp:  97.6 F (36.4 C)  98.1 F (36.7 C)  TempSrc:  Oral  Oral  SpO2:  97% 98%   Weight:  73.5 kg    Height:  5' 2 (1.575 m)      Physical Exam Gen:- Awake Alert, +ve respiratory distress unable to speak in sentences HEENT:- [place].AT, No sclera icterus Nose- [place] 4L/min Neck-Supple Neck,No JVD,.  Lungs-diminished breath sounds with few scattered wheezes  CV- S1, S2 normal, RRR, right-sided Port-A-Cath inside Abd-  +ve B.Sounds, Abd Soft, No tenderness,    Extremity/Skin:- No  edema,   good pedal pulses  Psych-affect is appropriate, oriented x3 Neuro-no new focal deficits, no tremors MSK--left upper extremity AV fistula with thrill and bruit  Data Reviewed: - Chest x-ray today with diffuse pulmonary metastasis otherwise no new acute cardiopulmonary findings - CTA chest 09/08/2023 without acute PE, there is concerns for bronchitis as well as innumerable bilateral pulmonary nodules and metastatic findings - WBC 8.5 hemoglobin 7.1 platelets 255 - Potassium is 3.0 sodium is 139 chloride is 95 creatinine 7.4 alk phos 180 otherwise LFTs are not elevated - Lactic acid 1.0 repeat 1.3 - COVID flu and RSV  negative  Assessment and Plan: Assessment & Plan COPD with acute exacerbation (HCC) Acute on chronic respiratory failure with hypoxia (HCC) Bronchitis, acute, with bronchospasm -- In the setting of pulmonary metastasis- -CTA chest from 25 2025 and chest x-ray from 07/20/2024 noted - Patient was seen in the ED on 07/19/2023 with similar complaints CTA chest at that  time was without PE there was concerns for bronchitis patient was treated with Augmentin  and azithromycin  as well as antitussives however she returns today with persistent respiratory symptoms specially cough and dyspnea - No missed HD sessions -Patient with persistent cough, wheezing and respiratory distress -Continue supplemental oxygen --baseline she requires 2 L at home currently up to 4 L. 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 - Outpatient follow-up with oncologist advised -Palliative care consult for goals of care requested ESRD (end stage renal  disease) (HCC) ESRD (end stage renal disease) on dialysis (HCC) - Continue HD per MWF schedule -Nephrology consult requested Anemia in chronic kidney disease Hgb down to 7.1 -Transfuse 1 unit of PRBC with HD in a.m. -Defer decision on ESA/Procrit to nephrology team   Advance Care Planning:   Code Status: Full Code   Consults: Nephrology and palliative care consults requested  Family Communication: Discussed with husband at bedside  Severity of Illness: The appropriate patient status for this patient is INPATIENT. Inpatient status is judged to be reasonable and necessary in order to provide the required intensity of service to ensure the patient's safety. The patient's presenting symptoms, physical exam findings, and initial radiographic and laboratory data in the context of their chronic comorbidities is felt to place them at high risk for further clinical deterioration. Furthermore, it is not anticipated that the patient will be medically stable for discharge from the hospital within 2 midnights of admission.   * I certify that at the point of admission it is my clinical judgment that the patient will require inpatient hospital care spanning beyond 2 midnights from the point of admission due to high intensity of service, high risk for further deterioration and high frequency of surveillance required.*  Author: Rendall Carwin, MD 07/20/2024 10:00 PM  For on call review [email or web address].

## 2024-07-20 NOTE — Assessment & Plan Note (Signed)
Stable, continue Protonix 

## 2024-07-21 DIAGNOSIS — J9621 Acute and chronic respiratory failure with hypoxia: Secondary | ICD-10-CM | POA: Diagnosis not present

## 2024-07-21 DIAGNOSIS — N186 End stage renal disease: Secondary | ICD-10-CM

## 2024-07-21 DIAGNOSIS — J441 Chronic obstructive pulmonary disease with (acute) exacerbation: Secondary | ICD-10-CM | POA: Diagnosis not present

## 2024-07-21 DIAGNOSIS — E1122 Type 2 diabetes mellitus with diabetic chronic kidney disease: Secondary | ICD-10-CM

## 2024-07-21 DIAGNOSIS — Z992 Dependence on renal dialysis: Secondary | ICD-10-CM

## 2024-07-21 DIAGNOSIS — D631 Anemia in chronic kidney disease: Secondary | ICD-10-CM

## 2024-07-21 LAB — COMPREHENSIVE METABOLIC PANEL WITH GFR
ALT: 23 U/L (ref 0–44)
AST: 14 U/L — ABNORMAL LOW (ref 15–41)
Albumin: 3.1 g/dL — ABNORMAL LOW (ref 3.5–5.0)
Alkaline Phosphatase: 151 U/L — ABNORMAL HIGH (ref 38–126)
Anion gap: 12 (ref 5–15)
BUN: 31 mg/dL — ABNORMAL HIGH (ref 8–23)
CO2: 30 mmol/L (ref 22–32)
Calcium: 8 mg/dL — ABNORMAL LOW (ref 8.9–10.3)
Chloride: 94 mmol/L — ABNORMAL LOW (ref 98–111)
Creatinine, Ser: 7.91 mg/dL — ABNORMAL HIGH (ref 0.44–1.00)
GFR, Estimated: 5 mL/min — ABNORMAL LOW (ref >60)
Glucose, Bld: 168 mg/dL — ABNORMAL HIGH (ref 70–99)
Potassium: 3.9 mmol/L (ref 3.5–5.1)
Sodium: 137 mmol/L (ref 135–145)
Total Bilirubin: 0.2 mg/dL (ref 0.0–1.2)
Total Protein: 5.7 g/dL — ABNORMAL LOW (ref 6.5–8.1)

## 2024-07-21 LAB — CBC
HCT: 21.9 % — ABNORMAL LOW (ref 36.0–46.0)
Hemoglobin: 6.7 g/dL — CL (ref 12.0–15.0)
MCH: 35.4 pg — ABNORMAL HIGH (ref 26.0–34.0)
MCHC: 30.6 g/dL (ref 30.0–36.0)
MCV: 115.9 fL — ABNORMAL HIGH (ref 80.0–100.0)
Platelets: 250 K/uL (ref 150–400)
RBC: 1.89 MIL/uL — ABNORMAL LOW (ref 3.87–5.11)
RDW: 21 % — ABNORMAL HIGH (ref 11.5–15.5)
WBC: 7 K/uL (ref 4.0–10.5)
nRBC: 0.6 % — ABNORMAL HIGH (ref 0.0–0.2)

## 2024-07-21 LAB — GLUCOSE, CAPILLARY
Glucose-Capillary: 157 mg/dL — ABNORMAL HIGH (ref 70–99)
Glucose-Capillary: 122 mg/dL — ABNORMAL HIGH (ref 70–99)
Glucose-Capillary: 118 mg/dL — ABNORMAL HIGH (ref 70–99)
Glucose-Capillary: 155 mg/dL — ABNORMAL HIGH (ref 70–99)

## 2024-07-21 LAB — HEMOGLOBIN A1C
Hgb A1c MFr Bld: 4.8 % (ref 4.8–5.6)
Mean Plasma Glucose: 91.06 mg/dL

## 2024-07-21 LAB — PREPARE RBC (CROSSMATCH)

## 2024-07-21 LAB — HEPATITIS B SURFACE ANTIGEN: Hepatitis B Surface Ag: NONREACTIVE

## 2024-07-21 LAB — MRSA NEXT GEN BY PCR, NASAL: MRSA by PCR Next Gen: NOT DETECTED

## 2024-07-21 MED ORDER — ANTICOAGULANT SODIUM CITRATE 4% (200MG/5ML) IV SOLN: 5.0000 mL | Status: DC | PRN

## 2024-07-21 MED ORDER — LIDOCAINE-PRILOCAINE 2.5-2.5 % EX CREA: 1.0000 | TOPICAL_CREAM | CUTANEOUS | Status: DC | PRN

## 2024-07-21 MED ORDER — PENTAFLUOROPROP-TETRAFLUOROETH EX AERO: 1.0000 | INHALATION_SPRAY | CUTANEOUS | Status: DC | PRN

## 2024-07-21 MED ORDER — DIPHENHYDRAMINE HCL 50 MG/ML IJ SOLN: 25.0000 mg | Freq: Once | INTRAMUSCULAR | Status: DC

## 2024-07-21 MED ORDER — HEPARIN SODIUM (PORCINE) 1000 UNIT/ML DIALYSIS: 2000.0000 [IU] | Freq: Once | INTRAMUSCULAR | Status: DC

## 2024-07-21 MED ORDER — CHLORHEXIDINE GLUCONATE CLOTH 2 % EX PADS
6.0000 | MEDICATED_PAD | Freq: Every day | CUTANEOUS | Status: DC
  Administered 2024-07-21 – 2024-07-23 (×3): 6 via TOPICAL

## 2024-07-21 MED ORDER — HEPARIN SODIUM (PORCINE) 1000 UNIT/ML DIALYSIS: 1000.0000 [IU] | INTRAMUSCULAR | Status: DC | PRN

## 2024-07-21 MED ORDER — LIDOCAINE HCL (PF) 1 % IJ SOLN: 5.0000 mL | INTRAMUSCULAR | Status: DC | PRN

## 2024-07-21 MED ORDER — SODIUM CHLORIDE 0.9% IV SOLUTION: Freq: Once | INTRAVENOUS | Status: DC

## 2024-07-21 NOTE — Progress Notes (Signed)
 PT Cancellation Note  Patient Details Name: Megan [name] MRN: [id number] DOB: [birth year]   Cancelled Treatment:    Reason Eval/Treat Not Completed: Patient at procedure or test/unavailable. Patient out for HD, will check back tomorrow.   3:53 PM, 07/21/24 Lynwood Music, MPT Physical Therapist with [facility] [phone] office [phone] mobile phone

## 2024-07-21 NOTE — TOC Initial Note (Signed)
 Transition of Care [facility]) - Initial/Assessment Note    Patient Details  Name: Megan [name] MRN: [id number] Date of Birth: Feb 21, 1961  Transition of Care [facility]) CM/SW Contact:    Sharlyne Stabs, RN Phone Number: 07/21/2024, 2:18 PM  Clinical Narrative:       Patient admitted COPD with acute exacerbation.  Patient assessed last month admission. Patient is active with [facility] for home health, has home oxygen  and DME needed. Active with [facility] for palliative. HD patient. IPCM following              Barriers to Discharge: Continued Medical Work up   Patient Goals and CMS Choice Patient states their goals for this hospitalization and ongoing recovery are:: return home CMS Medicare.gov Compare Post Acute Care list provided to:: Patient Represenative (must comment) Choice offered to / list presented to : Spouse      Expected Discharge Plan and Services       Living arrangements for the past 2 months: Single Family Home                     Prior Living Arrangements/Services Living arrangements for the past 2 months: Single Family Home Lives with:: Spouse            Activities of Daily Living   ADL Screening (condition at time of admission) Independently performs ADLs?: No Does the patient have a NEW difficulty with bathing/dressing/toileting/self-feeding that is expected to last >3 days?: Yes (Initiates electronic notice to provider for possible OT consult) Does the patient have a NEW difficulty with getting in/out of bed, walking, or climbing stairs that is expected to last >3 days?: Yes (Initiates electronic notice to provider for possible PT consult) Does the patient have a NEW difficulty with communication that is expected to last >3 days?: No Is the patient deaf or have difficulty hearing?: No Does the patient have difficulty seeing, even when wearing glasses/contacts?: No Does the patient have difficulty concentrating, remembering, or making decisions?:  No  Permission Sought/Granted     Admission diagnosis:  Acute respiratory distress [R06.03] Chronic anemia [D64.9] Bronchitis, acute, with bronchospasm [J20.9] COPD with acute exacerbation (HCC) [J44.1] Stage 5 chronic kidney disease on chronic dialysis (HCC) [N18.6, Z99.2] Acute cough [R05.1] Patient Active Problem List   Diagnosis Date Noted   COPD with acute exacerbation (HCC) 07/20/2024   Bronchitis, acute, with bronchospasm 07/20/2024   Acute on chronic respiratory failure with hypoxia (HCC) 07/20/2024   Fever presenting with conditions classified elsewhere 06/18/2024   Generalized weakness 06/12/2024   Diarrhea with dehydration 06/08/2024   Hypotension 06/08/2024   Failure to thrive in adult 06/08/2024   Cancer associated pain 05/26/2024   Postobstructive pneumonia 05/23/2024   Volume overload 05/12/2024   Nausea with vomiting 05/06/2024   Peripheral neuropathy 04/08/2024   Right-sided chest wall pain 04/08/2024   Osteopenia 04/08/2024   Genetic testing 03/26/2024   Mucosal abnormality of stomach 12/31/2023   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 difficile colitis 02/15/2021   Severe sepsis with septic shock (HCC) 02/14/2021   Dizziness    Breast mass, right    History of right breast  cancer    Lobar pneumonia, unspecified organism 07/03/2020   Pleural effusion, right    S/P left mastectomy 06/15/2020   Allergy, unspecified, initial encounter 03/03/2020   Anaphylactic shock, unspecified, initial encounter 03/03/2020   Breast cancer (HCC) 02/18/2020   History of modified radical mastectomy of right breast  02/18/2020   S/P mastectomy, right 02/18/2020   ESRD (end stage renal disease) on dialysis [facility])    Prolapsed internal hemorrhoids, grade 3 07/31/2019   Fluid overload, unspecified 05/24/2019   Anaphylactic reaction due to adverse effect of correct drug or medicament properly administered, initial encounter 04/30/2019   Unspecified protein-calorie malnutrition 03/03/2019   Hypertensive heart disease with heart failure (HCC) 03/01/2019   Atherosclerotic heart disease of native coronary artery without angina pectoris 03/01/2019   Chest pain 02/28/2019   Chest pain with high risk for cardiac etiology 02/28/2019   Elevated troponin 02/03/2019   ESRD (end stage renal disease) (HCC) 02/03/2019   Acute diastolic CHF (congestive heart failure) (HCC) 02/03/2019   Hypokalemia 02/03/2019   Acute respiratory failure with hypoxia (HCC) 02/03/2019   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)    Hypocalcemia 09/03/2018   Hypothyroidism, unspecified 09/21/2017   Anemia in chronic kidney disease 04/12/2017   Iron  deficiency anemia, unspecified 04/12/2017   Abnormal results of kidney function studies 04/11/2017   Acidosis 04/11/2017   Pancytopenia (HCC) 04/11/2017   Coagulation defect, unspecified 04/11/2017   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, with stage 5 chronic kidney disease, or end stage renal disease (HCC) 04/11/2017   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 04/11/2017   Other disorders of phosphorus metabolism 04/11/2017   Pain, unspecified 04/11/2017   Proteinuria, unspecified 04/11/2017   Pruritus, unspecified 04/11/2017   Unspecified kidney failure 04/11/2017   Encounter for prophylactic removal of breast 04/04/2017   Rectal bleeding 06/08/2016   Vaginal discharge 12/30/2015   Dyspepsia 11/26/2013   GERD (gastroesophageal reflux disease)    Type 2 diabetes mellitus (HCC)    CKD (chronic kidney  disease) stage 3, GFR 30-59 ml/min (HCC)    Diarrhea 08/01/2011   External hemorrhoid 08/01/2011   Hematochezia 07/13/2011   Dyspnea 04/21/2010   Atypical chest pain 04/21/2010   Mixed hyperlipidemia 03/11/2008   OBESITY 03/11/2008   Essential hypertension, benign 03/11/2008   PCP:  Duanne Butler DASEN, MD Pharmacy:   [facility] [phone] - [place], [place] - [address] AT [facility] OF [address] [place] [address] Phone: [phone] Fax: [phone]     Social Drivers of Health (SDOH) Social History: SDOH Screenings   Food Insecurity: No Food Insecurity (07/20/2024)  Housing: Low Risk  (07/20/2024)  Transportation Needs: No Transportation Needs (07/20/2024)  Utilities: Not At Risk (07/20/2024)  Alcohol Screen: Low Risk  (09/20/2023)  Depression (PHQ2-9): Low Risk  (07/01/2024)  Financial Resource Strain: Low Risk  (09/20/2023)  Physical Activity: Inactive (09/20/2023)  Social Connections: Unknown (06/08/2024)  Stress: No Stress Concern Present (09/20/2023)  Tobacco Use: Low Risk  (07/20/2024)   SDOH Interventions:     Readmission Risk Interventions    07/21/2024    2:17 PM 06/09/2024    4:05 PM 06/08/2024    8:34 AM  Readmission Risk Prevention Plan  Transportation Screening Complete Complete Complete  Medication Review (RN Care Manager) Complete Complete Complete  PCP or Specialist appointment within 3-5 days of discharge Not Complete  Complete  HRI or Home Care Consult Complete Complete Complete  SW Recovery Care/Counseling Consult Complete Complete Complete  Palliative Care Screening Not Complete Not Applicable Not Applicable  Skilled Nursing Facility Not Applicable Not Applicable Not Applicable

## 2024-07-21 NOTE — Plan of Care (Signed)
 Palliative-   Consult received and chart reviewed.  Attempted to see patient, however, she was off the floor for dialysis. Will attempt to see again tomorrow.   Cassondra Stain, AGNP-C Palliative Medicine  No charge

## 2024-07-21 NOTE — Progress Notes (Signed)
 Pt receives out-pt HD at [facility] MWF 0530 chair time. Will continue to assist as needed  Lavanda Fredrickson Dialysis Navigator [phone] [facility] #- [phone]

## 2024-07-21 NOTE — Progress Notes (Signed)
 PROGRESS NOTE   Megan [name]  FMW:[id number] DOB: 1960/08/18 DOA: 07/20/2024 PCP: Duanne Butler DASEN, MD   Chief Complaint  Patient presents with   Cough   Level of care: Stepdown  Brief Admission History:  63 y.o. female with medical history significant history of  right-sided breast cancer with pulmonary metastases, mediastinal, hilar and axillary lymphadenopathy related to underlying breast cancer with pulmonary metastases --status post bilateral mastectomies with on chronic hypoxic respiratory failure on 2 L at home chronically , h/o ESRD on HD MWF, chronic diastolic CHF,  hypertension, dyslipidemia, DM2,  and GERD presents to the ED for difficulty in the last 48 hours with concerns about persistent cough and worsening respiratory distress. Seen in the ED on 07/19/2023 with similar complaints CTA chest at that time was without PE there was concerns for bronchitis patient was treated with Augmentin  and azithromycin  as well as antitussives however she returns with persistent respiratory symptoms specially cough and dyspnea.  She was admitted with acute COPD exacerbation, acute bronchitis and acute on chronic respiratory failure in an immunocompromised patient.    Assessment and Plan:  COPD with acute exacerbation -- In the setting of pulmonary metastasis- -- CTA chest from 07/18/24 and chest x-ray from 07/20/2024 noted -- Patient was seen in the ED on 07/19/2023 with similar complaints CTA chest at that time was without PE there was concerns for bronchitis patient was treated with Augmentin  and azithromycin  as well as antitussives however she returns today with persistent respiratory symptoms specially cough and dyspnea - No missed HD sessions -Patient with persistent cough, wheezing and respiratory distress -Continue supplemental oxygen --baseline she requires 2 L at home currently up to 4 L. -Plan now to continue IV steroids, azithromycin  and bronchodilators and mucolytic/antitussives as  ordered  Acute on chronic respiratory failure with hypoxia -- In the setting of pulmonary metastasis --CTA chest from 25 2025 and chest x-ray from 07/20/2024 noted -- Patient was seen in the ED on 07/19/2023 with similar complaints CTA chest at that time was without PE there was concerns for bronchitis patient was treated with Augmentin  and azithromycin  as well as antitussives however she returns today with persistent respiratory symptoms specially cough and dyspnea - No missed HD sessions -Patient with persistent cough, wheezing and respiratory distress -Continue supplemental oxygen  -IV steroids, azithromycin  and bronchodilators and mucolytic/antitussives as ordered  Bronchitis, acute, with bronchospasm -- In the setting of pulmonary metastasis- -CTA chest from 25 2025 and chest x-ray from 07/20/2024 noted - Patient was seen in the ED on 07/19/2023 with similar complaints CTA chest at that time was without PE there was concerns for bronchitis patient was treated with Augmentin  and azithromycin  as well as antitussives however she returns today with persistent respiratory symptoms specially cough and dyspnea - No missed HD sessions -Patient with persistent cough, wheezing and respiratory distress -continue treatments   Primary malignant neoplasm of central portion of right breast -- Imaging studies show with pulmonary metastases, mediastinal, hilar and axillary lymphadenopathy related to underlying breast cancer with pulmonary metastases --status post bilateral mastectomies -Interval decrease in size of innumerable bilateral pulmonary nodules, lobular right pleural thickening, and left axillary and mediastinal lymphadenopathy,  Slightly decreased cutaneous thickening of the left breast/anterior chest wall consistent with treatment response. - Outpatient follow-up with oncologist advised - Palliative care consult for goals of care requested  Recurrent breast cancer, right -- Imaging studies  show with pulmonary metastases, mediastinal, hilar and axillary lymphadenopathy related to underlying breast cancer with pulmonary metastases --status post  bilateral mastectomies -Interval decrease in size of innumerable bilateral pulmonary nodules, lobular right pleural thickening, and left axillary and mediastinal lymphadenopathy,  Slightly decreased cutaneous thickening of the left breast/anterior chest wall consistent with treatment response. - Outpatient follow-up with oncologist advised -Palliative care consult for goals of care requested  ESRD (end stage renal disease) on dialysis  - Continue HD per MWF schedule - Nephrology consult requested  Anemia in chronic kidney disease Hgb down to 6.5 -Transfuse 1 unit of PRBC with HD today -Defer decision on ESA/Procrit to nephrology team -repeat CBC in AM   ESRD (end stage renal disease)  - Continue HD per MWF schedule -Nephrology consult requested and arranged for HD today  Malignant neoplasm of upper-inner quadrant of right female breast  -- Imaging studies show with pulmonary metastases, mediastinal, hilar and axillary lymphadenopathy related to underlying breast cancer with pulmonary metastases --status post bilateral mastectomies -Interval decrease in size of innumerable bilateral pulmonary nodules, lobular right pleural thickening, and left axillary and mediastinal lymphadenopathy,  Slightly decreased cutaneous thickening of the left breast/anterior chest wall consistent with treatment response. - Outpatient follow-up with oncologist advised - pt is due for chemotherapy tomorrow, advised pt/husband to notify cancer center that she is currently in hospital with active infection and chemo likely will be rescheduled.  Controlled Type 2 diabetes mellitus - Last A1c 5.3 reflecting excellent diabetic control Use Novolog /Humalog Sliding scale insulin  with Accu-Cheks/Fingersticks as ordered   GERD (gastroesophageal reflux disease) -  Stable, continue Protonix   Essential hypertension, benign - May use IV labetalol  as needed   DVT prophylaxis: sq heparin  Code Status: Full  Family Communication: husband at bedside  Disposition: return home with husband   Consultants:   Procedures:   Antimicrobials:    Subjective: Pt says she is already starting to notice some improvement with her coughing and shortness of breath.   Objective: Vitals:   07/21/24 1000 07/21/24 1007 07/21/24 1015 07/21/24 1020  BP: 133/80 (!) 140/77 139/76 139/76  Pulse: (!) 113 (!) 116 (!) 116   Resp: (!) 21 (!) 21 (!) 24   Temp: 98.4 F (36.9 C) 98.4 F (36.9 C)  98.4 F (36.9 C)  TempSrc: Oral Oral  Oral  SpO2: 99%  99%   Weight:      Height:        Intake/Output Summary (Last 24 hours) at 07/21/2024 1045 Last data filed at 07/20/2024 1835 Gross per 24 hour  Intake 885.99 ml  Output --  Net 885.99 ml   Filed Weights   07/20/24 1341 07/20/24 1750 07/21/24 0452  Weight: 78.5 kg 73.5 kg 74.8 kg   Examination:  General exam: Appears calm with frequent dry cough.   Respiratory system: mild increased work of breathing and tachypnea.  Cardiovascular system: tachycardic, normal S1 & S2 heard. No JVD, murmurs, rubs, gallops or clicks. No pedal edema. Gastrointestinal system: Abdomen is nondistended, soft and nontender. No organomegaly or masses felt. Normal bowel sounds heard. Central nervous system: Alert and oriented. No focal neurological deficits. Extremities: Symmetric 5 x 5 power. Skin: No rashes, lesions or ulcers. Psychiatry: Judgement and insight appear normal. Mood & affect appropriate.   Data Reviewed: I have personally reviewed following labs and imaging studies  CBC: Recent Labs  Lab 07/18/24 1128 07/20/24 1420 07/21/24 0259  WBC 7.8 8.5 7.0  NEUTROABS 5.9 6.8  --   HGB 7.4* 7.1* 6.7*  HCT 24.6* 23.6* 21.9*  MCV 114.4* 115.7* 115.9*  PLT 243 255 250  Basic Metabolic Panel: Recent Labs  Lab  07/18/24 1128 07/20/24 1420 07/21/24 0259  NA 140 139 137  K 3.2* 3.0* 3.9  CL 95* 95* 94*  CO2 31 25 30   GLUCOSE 95 110* 168*  BUN 9 25* 31*  CREATININE 2.89* 7.40* 7.91*  CALCIUM  8.4* 7.9* 8.0*  MG 2.0  --   --     CBG: Recent Labs  Lab 07/20/24 1804 07/20/24 2118 07/21/24 0613  GLUCAP 186* 182* 157*    Recent Results (from the past 240 hours)  Blood Culture (routine x 2)     Status: None (Preliminary result)   Collection Time: 07/20/24  2:20 PM   Specimen: BLOOD  Result Value Ref Range Status   Specimen Description BLOOD POWER PORT  Final   Special Requests NONE  Final   Culture   Final    NO GROWTH < 24 HOURS Performed at [facility], [address], [place], [place] [address]    Report Status PENDING  Incomplete  Blood Culture (routine x 2)     Status: None (Preliminary result)   Collection Time: 07/20/24  2:21 PM   Specimen: BLOOD  Result Value Ref Range Status   Specimen Description BLOOD BLOOD RIGHT ARM  Final   Special Requests NONE  Final   Culture   Final    NO GROWTH < 24 HOURS Performed at [facility], [address] [place], [place] [address]    Report Status PENDING  Incomplete  Resp panel by RT-PCR (RSV, Flu A&B, Covid) Anterior Nasal Swab     Status: None   Collection Time: 07/20/24  2:30 PM   Specimen: Anterior Nasal Swab  Result Value Ref Range Status   SARS Coronavirus 2 by RT PCR NEGATIVE NEGATIVE Final    Comment: (NOTE) SARS-CoV-2 target nucleic acids are NOT DETECTED.  The SARS-CoV-2 RNA is generally detectable in upper respiratory specimens during the acute phase of infection. The lowest concentration of SARS-CoV-2 viral copies this assay can detect is 138 copies/mL. A negative result does not preclude SARS-Cov-2 infection and should not be used as the sole basis for treatment or other patient management decisions. A negative result may occur with  improper specimen collection/handling, submission of specimen other than  nasopharyngeal swab, presence of viral mutation(s) within the areas targeted by this assay, and inadequate number of viral copies(<138 copies/mL). A negative result must be combined with clinical observations, patient history, and epidemiological information. The expected result is Negative.  Fact Sheet for Patients:  bloggercourse.com  Fact Sheet for Healthcare Providers:  seriousbroker.it  This test is no t yet approved or cleared by the [place]  FDA and  has been authorized for detection and/or diagnosis of SARS-CoV-2 by FDA under an Emergency Use Authorization (EUA). This EUA will remain  in effect (meaning this test can be used) for the duration of the COVID-19 declaration under Section 564(b)(1) of the Act, 21 U.S.C.section 360bbb-3(b)(1), unless the authorization is terminated  or revoked sooner.       Influenza A by PCR NEGATIVE NEGATIVE Final   Influenza B by PCR NEGATIVE NEGATIVE Final    Comment: (NOTE) The Xpert Xpress SARS-CoV-2/FLU/RSV plus assay is intended as an aid in the diagnosis of influenza from Nasopharyngeal swab specimens and should not be used as a sole basis for treatment. Nasal washings and aspirates are unacceptable for Xpert Xpress SARS-CoV-2/FLU/RSV testing.  Fact Sheet for Patients: bloggercourse.com  Fact Sheet for Healthcare Providers: seriousbroker.it  This test is not  yet approved or cleared by the [place]  FDA and has been authorized for detection and/or diagnosis of SARS-CoV-2 by FDA under an Emergency Use Authorization (EUA). This EUA will remain in effect (meaning this test can be used) for the duration of the COVID-19 declaration under Section 564(b)(1) of the Act, 21 U.S.C. section 360bbb-3(b)(1), unless the authorization is terminated or revoked.     Resp Syncytial Virus by PCR NEGATIVE NEGATIVE Final    Comment:  (NOTE) Fact Sheet for Patients: bloggercourse.com  Fact Sheet for Healthcare Providers: seriousbroker.it  This test is not yet approved or cleared by the [place]  FDA and has been authorized for detection and/or diagnosis of SARS-CoV-2 by FDA under an Emergency Use Authorization (EUA). This EUA will remain in effect (meaning this test can be used) for the duration of the COVID-19 declaration under Section 564(b)(1) of the Act, 21 U.S.C. section 360bbb-3(b)(1), unless the authorization is terminated or revoked.  Performed at [facility], [address], [place], [place] [address]   MRSA Next Gen by PCR, Nasal     Status: None   Collection Time: 07/20/24  5:35 PM   Specimen: Nasal Mucosa; Nasal Swab  Result Value Ref Range Status   MRSA by PCR Next Gen NOT DETECTED NOT DETECTED Final    Comment: (NOTE) The GeneXpert MRSA Assay (FDA approved for NASAL specimens only), is one component of a comprehensive MRSA colonization surveillance program. It is not intended to diagnose MRSA infection nor to guide or monitor treatment for MRSA infections. Test performance is not FDA approved in patients less than 57 years old. Performed at [facility], [address], [place], [place] [address]      Radiology Studies: [facility] 1 View Result Date: 07/20/2024 CLINICAL DATA:  Cough and shortness of breath, history of metastatic breast cancer EXAM: PORTABLE CHEST 1 VIEW COMPARISON:  07/18/2024 FINDINGS: Single frontal view of the chest demonstrates stable right chest wall port. Cardiac silhouette is unremarkable. Innumerable bilateral pulmonary metastases are again noted, without significant change since the previous exam. No new airspace disease, effusion, or pneumothorax. No acute displaced fracture. IMPRESSION: 1. Stable diffuse pulmonary metastases. 2. No acute airspace disease. Electronically Signed   By: Ozell Daring M.D.   On:  07/20/2024 14:37   Scheduled Meds:  sodium chloride    Intravenous Once   sodium chloride    Intravenous Once   aspirin  EC  81 mg Oral Daily   benzonatate   200 mg Oral BID   Chlorhexidine  Gluconate Cloth  6 each Topical Q0600   cinacalcet   90 mg Oral Q breakfast   dextromethorphan -guaiFENesin   1 tablet Oral BID   feeding supplement  237 mL Oral BID BM   gabapentin   300 mg Oral QHS   heparin   2,000 Units Dialysis Once in dialysis   heparin  injection (subcutaneous)  5,000 Units Subcutaneous Q8H   insulin  aspart  0-5 Units Subcutaneous QHS   insulin  aspart  0-6 Units Subcutaneous TID WC   ipratropium-albuterol   3 mL Nebulization TID   lanthanum   3,000 mg Oral TID WC   methylPREDNISolone  (SOLU-MEDROL ) injection  40 mg Intravenous Q12H   midodrine   2.5 mg Oral Q M,W,F-HD   multivitamin  1 tablet Oral QHS   pantoprazole   40 mg Oral BID AC   sodium chloride  flush  3 mL Intravenous Q12H   sodium chloride  flush  3 mL Intravenous Q12H   Continuous Infusions:  sodium chloride      anticoagulant sodium citrate   azithromycin        LOS: 1 day   Critical Care Procedure Note Authorized and Performed by: KYM Louder MD  Total Critical Care time:  54 mins Due to a high probability of clinically significant, life threatening deterioration, the patient required my highest level of preparedness to intervene emergently and I personally spent this critical care time directly and personally managing the patient.  This critical care time included obtaining a history; examining the patient, pulse oximetry; ordering and review of studies; arranging urgent treatment with development of a management plan; evaluation of patient's response of treatment; frequent reassessment; and discussions with other providers.  This critical care time was performed to assess and manage the high probability of imminent and life threatening deterioration that could result in multi-organ failure.  It was exclusive of separately  billable procedures and treating other patients and teaching time.   Afton Louder, MD How to contact the [facility] Attending or Consulting provider 7A - 7P or covering provider during after hours 7P -7A, for this patient?  Check the care team in [facility] and look for a) attending/consulting [facility] provider listed and b) the [facility] team listed Log into [email or web address] to find provider on call.  Locate the [facility] provider you are looking for under [facility] and page to a number that you can be directly reached. If you still have difficulty reaching the provider, please page the [facility] (Director on Call) for the Hospitalists listed on amion for assistance.  07/21/2024, 10:45 AM

## 2024-07-21 NOTE — Plan of Care (Signed)

## 2024-07-21 NOTE — Hospital Course (Signed)
 63 y.o. female with medical history significant history of  right-sided breast cancer with pulmonary metastases, mediastinal, hilar and axillary lymphadenopathy related to underlying breast cancer with pulmonary metastases --status post bilateral mastectomies with on chronic hypoxic respiratory failure on 2 L at home chronically , h/o ESRD on HD MWF, chronic diastolic CHF,  hypertension, dyslipidemia, DM2,  and GERD presents to the ED for difficulty in the last 48 hours with concerns about persistent cough and worsening respiratory distress. Seen in the ED on 07/19/2023 with similar complaints CTA chest at that time was without PE there was concerns for bronchitis patient was treated with Augmentin  and azithromycin  as well as antitussives however she returns with persistent respiratory symptoms specially cough and dyspnea.  She was admitted with acute COPD exacerbation, acute bronchitis and acute on chronic respiratory failure in an immunocompromised patient.

## 2024-07-21 NOTE — Consult Note (Signed)
 ESRD Consult Note  Requesting provider: [facility]  Reason for consult: ESRD, provision of dialysis  Assessment/Recommendations:  ESRD -outpatient HD orders: [facility] MWF.  3 hours 15 minutes.  AVF, 15-gauge, flow rates: 400/500.  EDW 71 kg.  2K/2.5 calcium .  UF profile 2.  Heparin : 2400 units bolus.  Meds: 200 mcg once weekly, Venofer  50 mg once a week, calcitriol  1.75 mcg 3 times a week -HD today per her MWF schedule  Acute on chronic respiratory failure with hypoxia COPD with acute exacerbation Pulmonary metastasis - COPD management per primary (COPD seems to be consequence of her pulmonary metastasis): Receiving IV steroids and azithromycin  along with bronchodilators - UF as tolerated with HD  Breast cancer with metastasis - Follows with oncology as an outpatient  Volume/ hypertension  -UF as tolerated with HD  Anemia of Chronic Kidney Disease -Hemoglobin 6.7  -Transfuse PRN for Hgb <7. Agree with 1U PRBC with HD today - Will hold off on ESA since she is actively receiving chemo as an outpatient  Secondary Hyperparathyroidism/Hyperphosphatemia - Monitor phosphorus, resume home binders if on any, resuming calcitriol   Recommendations were discussed with the primary team.  Ephriam Stank, MD [facility]  History of Present Illness: Megan [name] is a/an 63 y.o. female with a past medical history of ESRD, breast cancer with with pulmonary and lymph node metastasis, chronic hypoxic respiratory failure on 2 L nasal cannula at home, DM2, hypertension, chronic diastolic CHF, GERD, hyperlipidemia who presents with shortness of breath, persistent cough, worsening respiratory distress.  Chest x-ray in the ER showed diffuse pulmonary metastasis otherwise no acute pulmonary findings.  COVID/flu/RSV negative. Patient reports feeling much better as compared to yesterday. Denies any recent issues with HD. Denies CP, recent access issues.   Medications:  Current  Facility-Administered Medications  Medication Dose Route Frequency Provider Last Rate Last Admin   0.9 %  sodium chloride  infusion (Manually program via Guardrails IV Fluids)   Intravenous Once Emokpae, Courage, MD       0.9 %  sodium chloride  infusion (Manually program via Guardrails IV Fluids)   Intravenous Once Dortches, Carole N, DO       0.9 %  sodium chloride  infusion   Intravenous PRN Emokpae, Courage, MD       acetaminophen  (TYLENOL ) tablet 650 mg  650 mg Oral Q6H PRN Emokpae, Courage, MD       Or   acetaminophen  (TYLENOL ) suppository 650 mg  650 mg Rectal Q6H PRN Emokpae, Courage, MD       albuterol  (PROVENTIL ) (2.5 MG/3ML) 0.083% nebulizer solution 2.5 mg  2.5 mg Nebulization Q2H PRN Emokpae, Courage, MD       ALPRAZolam  (XANAX ) tablet 0.5 mg  0.5 mg Oral BID PRN Emokpae, Courage, MD       aspirin  EC tablet 81 mg  81 mg Oral Daily Emokpae, Courage, MD   81 mg at 07/21/24 0756   azithromycin  (ZITHROMAX ) 500 mg in sodium chloride  0.9 % 250 mL IVPB  500 mg Intravenous Q24H Emokpae, Courage, MD       benzonatate  (TESSALON ) capsule 200 mg  200 mg Oral BID Emokpae, Courage, MD   200 mg at 07/21/24 0756   bisacodyl  (DULCOLAX) suppository 10 mg  10 mg Rectal Daily PRN Pearlean Manus, MD       Chlorhexidine  Gluconate Cloth 2 % PADS 6 each  6 each Topical Q0600 Melia Lynwood ORN, MD   6 each at 07/21/24 0532   cinacalcet  (SENSIPAR ) tablet 90 mg  90  mg Oral Q breakfast Pearlean, Courage, MD   90 mg at 07/21/24 0755   dextromethorphan -guaiFENesin  (MUCINEX  DM) 30-600 MG per 12 hr tablet 1 tablet  1 tablet Oral BID Pearlean Manus, MD   1 tablet at 07/21/24 0756   diphenoxylate -atropine  (LOMOTIL ) 2.5-0.025 MG per tablet 1 tablet  1 tablet Oral QID PRN Pearlean Manus, MD       feeding supplement (ENSURE PLUS HIGH PROTEIN) liquid 237 mL  237 mL Oral BID BM Emokpae, Courage, MD       gabapentin  (NEURONTIN ) capsule 300 mg  300 mg Oral QHS Emokpae, Courage, MD   300 mg at 07/20/24 2124   guaiFENesin -codeine   100-10 MG/5ML solution 10 mL  10 mL Oral Q4H PRN Pearlean Manus, MD       heparin  injection 5,000 Units  5,000 Units Subcutaneous Q8H Emokpae, Courage, MD   5,000 Units at 07/21/24 0500   insulin  aspart (novoLOG ) injection 0-5 Units  0-5 Units Subcutaneous QHS Emokpae, Courage, MD       insulin  aspart (novoLOG ) injection 0-6 Units  0-6 Units Subcutaneous TID WC Emokpae, Courage, MD       ipratropium-albuterol  (DUONEB) 0.5-2.5 (3) MG/3ML nebulizer solution 3 mL  3 mL Nebulization TID Pearlean, Courage, MD   3 mL at 07/21/24 0730   labetalol  (NORMODYNE ) injection 10 mg  10 mg Intravenous Q4H PRN Emokpae, Courage, MD       lanthanum  (FOSRENOL ) chewable tablet 2,000 mg  2,000 mg Oral Daily PRN Lenon Elsie HERO, RPH       lanthanum  (FOSRENOL ) chewable tablet 3,000 mg  3,000 mg Oral TID WC Lenon Elsie HERO, RPH   3,000 mg at 07/21/24 9195   methylPREDNISolone  sodium succinate (SOLU-MEDROL ) 40 mg/mL injection 40 mg  40 mg Intravenous Q12H Emokpae, Courage, MD   40 mg at 07/21/24 0500   midodrine  (PROAMATINE ) tablet 2.5 mg  2.5 mg Oral Q M,W,F-HD Emokpae, Courage, MD       multivitamin (RENA-VIT) tablet 1 tablet  1 tablet Oral QHS Lenon Elsie HERO, RPH   1 tablet at 07/20/24 2124   ondansetron  (ZOFRAN ) tablet 4 mg  4 mg Oral Q6H PRN Emokpae, Courage, MD       Or   ondansetron  (ZOFRAN ) injection 4 mg  4 mg Intravenous Q6H PRN Emokpae, Courage, MD       pantoprazole  (PROTONIX ) EC tablet 40 mg  40 mg Oral BID AC Emokpae, Courage, MD   40 mg at 07/21/24 0755   polyethylene glycol (MIRALAX  / GLYCOLAX ) packet 17 g  17 g Oral Daily PRN Emokpae, Courage, MD       sodium chloride  flush (NS) 0.9 % injection 3 mL  3 mL Intravenous Q12H Emokpae, Courage, MD   3 mL at 07/21/24 0757   sodium chloride  flush (NS) 0.9 % injection 3 mL  3 mL Intravenous Q12H Emokpae, Courage, MD   3 mL at 07/20/24 2130   sodium chloride  flush (NS) 0.9 % injection 3 mL  3 mL Intravenous PRN Emokpae, Courage, MD       traZODone   (DESYREL ) tablet 50 mg  50 mg Oral QHS PRN Emokpae, Courage, MD         ALLERGIES Motrin [ibuprofen] and Trodelvy  [sacituzumab govitecan -hziy]  MEDICAL HISTORY Past Medical History:  Diagnosis Date   (HFpEF) heart failure with preserved ejection fraction (HCC)    a. 01/2019 Echo: EF 55-60%, mild conc LVH. DD.  Torn MV chordae.   Anemia    Atypical chest pain  a. 08/2018 MV: EF 59%, no ischemia; b. 02/2019 Cath: nonobs dzs.   Blood transfusion without reported diagnosis    Breast cancer (HCC) 10/12/2020   Cataract    ESRD (end stage renal disease) on dialysis [facility])    a. HD T, T, S   Essential hypertension, benign    GERD (gastroesophageal reflux disease)    Headache    Hemorrhoids    Mixed hyperlipidemia    Morbid obesity (HCC)    Non-obstructive CAD (coronary artery disease)    a. 02/2019 CathL LM nl, LAD 5m, LCX nl, RCA 25p, EF 55-65%.   PONV (postoperative nausea and vomiting)    Renal insufficiency    S/P colonoscopy 08/2009   Dr. Rollin: sessile polyp (benign lymphoid), large hemorrhoids, repeat 5-10 years   Temporal arteritis (HCC)    Type 2 diabetes mellitus (HCC)    Wears glasses      SOCIAL HISTORY Social History   Socioeconomic History   Marital status: Married    Spouse name: Not on file   Number of children: Not on file   Years of education: Not on file   Highest education level: 12th grade  Occupational History   Occupation: Systems Developer: [employer]  Tobacco Use   Smoking status: Never    Passive exposure: Never   Smokeless tobacco: Never  Vaping Use   Vaping status: Never Used  Substance and Sexual Activity   Alcohol use: No   Drug use: No   Sexual activity: Yes    Birth control/protection: Surgical  Other Topics Concern   Not on file  Social History Narrative   Works at [employer] in [place].    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   Also has to get items from high shelves-causes achy pain in shoulder area       Married.   Children are grown, out of house.   Social Drivers of Corporate Investment Banker Strain: Low Risk  (09/20/2023)   Overall Financial Resource Strain (CARDIA)    Difficulty of Paying Living Expenses: Not hard at all  Food Insecurity: No Food Insecurity (07/20/2024)   Hunger Vital Sign    Worried About Running Out of Food in the Last Year: Never true    Ran Out of Food in the Last Year: Never true  Transportation Needs: No Transportation Needs (07/20/2024)   PRAPARE - Administrator, Civil Service (Medical): No    Lack of Transportation (Non-Medical): No  Physical Activity: Inactive (09/20/2023)   Exercise Vital Sign    Days of Exercise per Week: 0 days    Minutes of Exercise per Session: 0 min  Stress: No Stress Concern Present (09/20/2023)   [employer] of Occupational Health - Occupational Stress Questionnaire    Feeling of Stress : Not at all  Social Connections: Unknown (06/08/2024)   Social Connection and Isolation Panel    Frequency of Communication with Friends and Family: Three times a week    Frequency of Social Gatherings with Friends and Family: Patient declined    Attends Religious Services: Patient declined    Active Member of Clubs or Organizations: Patient declined    Attends Banker Meetings: Patient declined    Marital Status: Married  Catering Manager Violence: Not At Risk (07/20/2024)   Humiliation, Afraid, Rape, and Kick questionnaire    Fear of Current or Ex-Partner: No    Emotionally Abused:  No    Physically Abused: No    Sexually Abused: No     FAMILY HISTORY Family History  Problem Relation Age of Onset   Hypertension Mother    Coronary artery disease Mother    Diabetes Mother    Heart attack Father    Hypertension Sister    Coronary artery disease Sister    Hypertension Brother    Colon cancer Brother    Heart attack Maternal Grandmother    Heart attack Maternal Grandfather    Heart attack Paternal  Grandmother    Heart attack Paternal Grandfather    Hypertension Son    Heart attack Maternal Aunt    Hypertension Maternal Aunt    Diabetes Maternal Aunt    Heart attack Maternal Uncle    Hypertension Maternal Uncle    Diabetes Maternal Uncle    Heart attack Paternal Aunt    Hypertension Paternal Aunt    Diabetes Paternal Aunt    Heart attack Paternal Uncle    Hypertension Paternal Uncle    Diabetes Paternal Uncle      Review of Systems: 12 systems were reviewed and negative except per HPI  Physical Exam: Vitals:   07/21/24 0730 07/21/24 0800  BP:  (!) 169/93  Pulse:  (!) 114  Resp:  (!) 24  Temp: 97.9 F (36.6 C)   SpO2:  97%   No intake/output data recorded.  Intake/Output Summary (Last 24 hours) at 07/21/2024 0810 Last data filed at 07/20/2024 1835 Gross per 24 hour  Intake 885.99 ml  Output --  Net 885.99 ml   General: well-appearing, no acute distress HEENT: anicteric sclera, MMM CV: normal rate, no murmurs, no edema Lungs: poor air exchange diffusely, speaking in full sentences, bilateral chest rise, normal wob Abd: soft, non-tender, non-distended Skin: no visible lesions or rashes, port in place Psych: alert, engaged, appropriate mood and affect Neuro: normal speech, no gross focal deficits  Dialysis access: LUE AVF +b/t  Test Results Reviewed Lab Results  Component Value Date   NA 137 07/21/2024   K 3.9 07/21/2024   CL 94 (L) 07/21/2024   CO2 30 07/21/2024   BUN 31 (H) 07/21/2024   CREATININE 7.91 (H) 07/21/2024   CALCIUM  8.0 (L) 07/21/2024   ALBUMIN  3.1 (L) 07/21/2024   PHOS 1.8 (L) 07/08/2024    I have reviewed relevant outside healthcare records

## 2024-07-21 NOTE — Progress Notes (Addendum)
 The patient completed the HD treatment, removing 2.5 liters of fluid. One units of PRBC was transfused with no adverse reactions. HD BFR 350 ml/min.  07/21/24 1322  Vitals  Weight 72.3 kg  Type of Weight Post-Dialysis  Oxygen  Therapy  SpO2 100 %  O2 Device Nasal Cannula  O2 Flow Rate (L/min) 2 L/min  During Treatment Monitoring  Intra-Hemodialysis Comments Tx completed  Post Treatment  Dialyzer Clearance Lightly streaked  Hemodialysis Intake (mL) 315 mL  Liters Processed 70  Fluid Removed (mL) 2500 mL  Tolerated HD Treatment Yes  Post-Hemodialysis Comments see notes.  AVG/AVF Arterial Site Held (minutes) 10 minutes  AVG/AVF Venous Site Held (minutes) 10 minutes

## 2024-07-22 ENCOUNTER — Inpatient Hospital Stay

## 2024-07-22 LAB — BPAM RBC
Blood Product Expiration Date: 202512222359
ISSUE DATE / TIME: 202512081003
Unit Type and Rh: 1700

## 2024-07-22 LAB — TYPE AND SCREEN
ABO/RH(D): B POS
Antibody Screen: NEGATIVE
Unit division: 0

## 2024-07-22 LAB — GLUCOSE, CAPILLARY
Glucose-Capillary: 114 mg/dL — ABNORMAL HIGH (ref 70–99)
Glucose-Capillary: 116 mg/dL — ABNORMAL HIGH (ref 70–99)
Glucose-Capillary: 134 mg/dL — ABNORMAL HIGH (ref 70–99)
Glucose-Capillary: 150 mg/dL — ABNORMAL HIGH (ref 70–99)
Glucose-Capillary: 151 mg/dL — ABNORMAL HIGH (ref 70–99)

## 2024-07-22 LAB — HEMOGLOBIN AND HEMATOCRIT, BLOOD
HCT: 26.2 % — ABNORMAL LOW (ref 36.0–46.0)
Hemoglobin: 8.1 g/dL — ABNORMAL LOW (ref 12.0–15.0)

## 2024-07-22 LAB — HEPATITIS B SURFACE ANTIBODY, QUANTITATIVE: Hep B S AB Quant (Post): 133 m[IU]/mL

## 2024-07-22 MED ORDER — SODIUM CHLORIDE 0.9 % IV SOLN
2.0000 g | INTRAVENOUS | Status: DC
  Administered 2024-07-22: 2 g via INTRAVENOUS
  Filled 2024-07-22: qty 20

## 2024-07-22 MED ORDER — IPRATROPIUM-ALBUTEROL 0.5-2.5 (3) MG/3ML IN SOLN
3.0000 mL | Freq: Two times a day (BID) | RESPIRATORY_TRACT | Status: DC
  Administered 2024-07-22 – 2024-07-23 (×2): 3 mL via RESPIRATORY_TRACT
  Filled 2024-07-22 (×2): qty 3

## 2024-07-22 NOTE — Plan of Care (Signed)
 Problem: Education: Goal: Knowledge of General Education information will improve Description: Including pain rating scale, medication(s)/side effects and non-pharmacologic comfort measures 07/22/2024 1614 by Clotilda Jovita SAUNDERS, RN Outcome: Progressing 07/22/2024 1611 by Clotilda Jovita SAUNDERS, RN Outcome: Progressing   Problem: Clinical Measurements: Goal: Ability to maintain clinical measurements within normal limits will improve 07/22/2024 1614 by Clotilda Jovita SAUNDERS, RN Outcome: Not Progressing 07/22/2024 1611 by Clotilda Jovita SAUNDERS, RN Outcome: Progressing Goal: Will remain free from infection 07/22/2024 1614 by Clotilda Jovita SAUNDERS, RN Outcome: Not Progressing 07/22/2024 1611 by Clotilda Jovita SAUNDERS, RN Outcome: Progressing Goal: Diagnostic test results will improve 07/22/2024 1614 by Clotilda Jovita SAUNDERS, RN Outcome: Progressing 07/22/2024 1611 by Clotilda Jovita SAUNDERS, RN Outcome: Progressing Goal: Respiratory complications will improve 07/22/2024 1614 by Clotilda Jovita SAUNDERS, RN Outcome: Not Progressing 07/22/2024 1611 by Clotilda Jovita SAUNDERS, RN Outcome: Progressing Goal: Cardiovascular complication will be avoided 07/22/2024 1614 by Clotilda Jovita SAUNDERS, RN Outcome: Not Progressing 07/22/2024 1611 by Clotilda Jovita SAUNDERS, RN Outcome: Progressing   Problem: Activity: Goal: Risk for activity intolerance will decrease 07/22/2024 1614 by Clotilda Jovita SAUNDERS, RN Outcome: Progressing 07/22/2024 1611 by Clotilda Jovita SAUNDERS, RN Outcome: Progressing   Problem: Nutrition: Goal: Adequate nutrition will be maintained 07/22/2024 1614 by Clotilda Jovita SAUNDERS, RN Outcome: Progressing 07/22/2024 1611 by Clotilda Jovita SAUNDERS, RN Outcome: Progressing   Problem: Coping: Goal: Level of anxiety will decrease 07/22/2024 1614 by Clotilda Jovita SAUNDERS, RN Outcome: Progressing 07/22/2024 1611 by Clotilda Jovita SAUNDERS, RN Outcome: Progressing   Problem: Elimination: Goal: Will not experience complications related to bowel motility 07/22/2024 1614 by Clotilda Jovita SAUNDERS,  RN Outcome: Progressing 07/22/2024 1611 by Clotilda Jovita SAUNDERS, RN Outcome: Progressing Goal: Will not experience complications related to urinary retention 07/22/2024 1614 by Clotilda Jovita SAUNDERS, RN Outcome: Progressing 07/22/2024 1611 by Clotilda Jovita SAUNDERS, RN Outcome: Progressing   Problem: Pain Managment: Goal: General experience of comfort will improve and/or be controlled 07/22/2024 1614 by Clotilda Jovita SAUNDERS, RN Outcome: Progressing 07/22/2024 1611 by Clotilda Jovita SAUNDERS, RN Outcome: Progressing   Problem: Safety: Goal: Ability to remain free from injury will improve 07/22/2024 1614 by Clotilda Jovita SAUNDERS, RN Outcome: Progressing 07/22/2024 1611 by Clotilda Jovita SAUNDERS, RN Outcome: Progressing   Problem: Skin Integrity: Goal: Risk for impaired skin integrity will decrease 07/22/2024 1614 by Clotilda Jovita SAUNDERS, RN Outcome: Progressing 07/22/2024 1611 by Clotilda Jovita SAUNDERS, RN Outcome: Progressing   Problem: Education: Goal: Ability to describe self-care measures that may prevent or decrease complications (Diabetes Survival Skills Education) will improve 07/22/2024 1614 by Clotilda Jovita SAUNDERS, RN Outcome: Progressing 07/22/2024 1611 by Clotilda Jovita SAUNDERS, RN Outcome: Progressing Goal: Individualized Educational Video(s) 07/22/2024 1614 by Clotilda Jovita SAUNDERS, RN Outcome: Progressing 07/22/2024 1611 by Clotilda Jovita SAUNDERS, RN Outcome: Progressing   Problem: Coping: Goal: Ability to adjust to condition or change in health will improve 07/22/2024 1614 by Clotilda Jovita SAUNDERS, RN Outcome: Progressing 07/22/2024 1611 by Clotilda Jovita SAUNDERS, RN Outcome: Progressing   Problem: Fluid Volume: Goal: Ability to maintain a balanced intake and output will improve 07/22/2024 1614 by Clotilda Jovita SAUNDERS, RN Outcome: Not Progressing 07/22/2024 1611 by Clotilda Jovita SAUNDERS, RN Outcome: Progressing   Problem: Health Behavior/Discharge Planning: Goal: Ability to identify and utilize available resources and services will improve 07/22/2024 1614 by  Clotilda Jovita SAUNDERS, RN Outcome: Progressing 07/22/2024 1611 by Clotilda Jovita SAUNDERS, RN Outcome: Progressing Goal: Ability to manage health-related needs will improve 07/22/2024 1614 by Clotilda Jovita SAUNDERS, RN Outcome: Progressing 07/22/2024 1611 by Clotilda  Jovita SAUNDERS, RN Outcome: Progressing   Problem: Metabolic: Goal: Ability to maintain appropriate glucose levels will improve 07/22/2024 1614 by Clotilda Jovita SAUNDERS, RN Outcome: Progressing 07/22/2024 1611 by Clotilda Jovita SAUNDERS, RN Outcome: Progressing   Problem: Nutritional: Goal: Maintenance of adequate nutrition will improve 07/22/2024 1614 by Clotilda Jovita SAUNDERS, RN Outcome: Not Progressing 07/22/2024 1611 by Clotilda Jovita SAUNDERS, RN Outcome: Progressing Goal: Progress toward achieving an optimal weight will improve 07/22/2024 1614 by Clotilda Jovita SAUNDERS, RN Outcome: Not Progressing 07/22/2024 1611 by Clotilda Jovita SAUNDERS, RN Outcome: Progressing   Problem: Skin Integrity: Goal: Risk for impaired skin integrity will decrease 07/22/2024 1614 by Clotilda Jovita SAUNDERS, RN Outcome: Progressing 07/22/2024 1611 by Clotilda Jovita SAUNDERS, RN Outcome: Progressing   Problem: Tissue Perfusion: Goal: Adequacy of tissue perfusion will improve 07/22/2024 1614 by Clotilda Jovita SAUNDERS, RN Outcome: Progressing 07/22/2024 1611 by Clotilda Jovita SAUNDERS, RN Outcome: Progressing   Problem: Education: Goal: Knowledge of disease and its progression will improve Outcome: Not Progressing Goal: Individualized Educational Video(s) Outcome: Not Progressing   Problem: Fluid Volume: Goal: Compliance with measures to maintain balanced fluid volume will improve Outcome: Not Progressing   Problem: Health Behavior/Discharge Planning: Goal: Ability to manage health-related needs will improve Outcome: Progressing   Problem: Nutritional: Goal: Ability to make healthy dietary choices will improve Outcome: Not Progressing   Problem: Clinical Measurements: Goal: Complications related to the disease process,  condition or treatment will be avoided or minimized Outcome: Not Progressing

## 2024-07-22 NOTE — Evaluation (Addendum)
 Physical Therapy Evaluation Patient Details Name: Megan [name] MRN: [id number] DOB: [birth year] Today's Date: 07/22/2024  History of Present Illness  Megan [name] is a 63 y.o. female with medical history significant history of  right-sided breast cancer with pulmonary metastases, mediastinal, hilar and axillary lymphadenopathy related to underlying breast cancer with pulmonary metastases --status post bilateral mastectomies with on chronic hypoxic respiratory failure on 2 L at home chronically , h/o ESRD on HD MWF, chronic diastolic CHF,  hypertension, dyslipidemia, DM2,  and GERD presents to the ED for difficulty in the last 48 hours with concerns about persistent cough and worsening respiratory distress.   Clinical Impression  Patient demonstrates good return for sitting up at bedside with [facility] raised, once seated had episodes of coughing with SpO2 dropping from 93% to 80% on room air and put back on 2 LPM O2. Patient's spouse demonstrates good return for assisting her to chair, but unable to ambulate due to fatigue and rapid HR up to 136 BPM. Patient tolerated sitting up in chair after therapy with her spouse present. Patient will benefit from continued skilled physical therapy in hospital and recommended venue below to increase strength, balance, endurance for safe ADLs and gait.          If plan is discharge home, recommend the following: A little help with walking and/or transfers;A little help with bathing/dressing/bathroom;Help with stairs or ramp for entrance;Two people to help with walking and/or transfers;Assist for transportation   Can travel by private vehicle        Equipment Recommendations None recommended by PT  Recommendations for Other Services       Functional Status Assessment Patient has had a recent decline in their functional status and demonstrates the ability to make significant improvements in function in a reasonable and predictable amount of time.      Precautions / Restrictions Precautions Precautions: Fall Recall of Precautions/Restrictions: Intact Restrictions Weight Bearing Restrictions Per Provider Order: No      Mobility  Bed Mobility Overal bed mobility: Needs Assistance Bed Mobility: Supine to Sit     Supine to sit: Contact guard, Min assist, HOB elevated     General bed mobility comments: increased time    Transfers Overall transfer level: Needs assistance Equipment used: 1 person hand held assist Transfers: Sit to/from Stand, Bed to chair/wheelchair/BSC Sit to Stand: Min assist   Step pivot transfers: Min assist       General transfer comment: Patient spouse demonstrates good return for assisting patient during transfer to chair    Ambulation/Gait Ambulation/Gait assistance: Min assist Gait Distance (Feet): 3 Feet Assistive device: 1 person hand held assist Gait Pattern/deviations: Decreased step length - right, Decreased step length - left, Decreased stride length Gait velocity: decreased     General Gait Details: slow labored movment, limited mostly due to increasing HR up to 136 BPM and fatigue  Stairs            Wheelchair Mobility     Tilt Bed    Modified Rankin (Stroke Patients Only)       Balance Overall balance assessment: Needs assistance Sitting-balance support: Feet supported, No upper extremity supported Sitting balance-Leahy Scale: Fair Sitting balance - Comments: fair to good seated at EOB.   Standing balance support: Bilateral upper extremity supported, During functional activity Standing balance-Leahy Scale: Fair Standing balance comment: Fair with HHA.  Pertinent Vitals/Pain Pain Assessment Pain Assessment: Faces Pain Location: R arm with P/ROM Pain Descriptors / Indicators: Discomfort, Grimacing Pain Intervention(s): Limited activity within patient's tolerance, Monitored during session, Repositioned    Home Living  Family/patient expects to be discharged to:: Private residence Living Arrangements: Spouse/significant other Available Help at Discharge: Family;Available 24 hours/day Type of Home: House Home Access: Stairs to enter Entrance Stairs-Rails: None Entrance Stairs-Number of Steps: 1   Home Layout: One level Home Equipment: Wheelchair - Biomedical Scientist (2 wheels)      Prior Function Prior Level of Function : Needs assist       Physical Assist : ADLs (physical);Mobility (physical) Mobility (physical): Transfers;Gait;Stairs;Bed mobility ADLs (physical): Dressing;IADLs Mobility Comments: Supervised household ambulation without AD; HHA by husband. ADLs Comments: Initialy reported independence with ADL's, but later stated husband assists with dressing. Assisted by husband for IADL's.     Extremity/Trunk Assessment   Upper Extremity Assessment Upper Extremity Assessment: Defer to OT evaluation    Lower Extremity Assessment Lower Extremity Assessment: Generalized weakness    Cervical / Trunk Assessment Cervical / Trunk Assessment: Normal  Communication   Communication Communication: No apparent difficulties    Cognition Arousal: Alert Behavior During Therapy: WFL for tasks assessed/performed   PT - Cognitive impairments: No apparent impairments                         Following commands: Intact       Cueing Cueing Techniques: Verbal cues     General Comments General comments (skin integrity, edema, etc.): HR increased to >120 when first sitting at EOB. Pt off supplemental O2 at the start of the session, but was placed back on it once seated at EOB. Pt wears supplemental O2 at baseline.    Exercises     Assessment/Plan    PT Assessment Patient needs continued PT services  PT Problem List Decreased strength;Decreased activity tolerance;Decreased balance;Decreased mobility       PT Treatment Interventions DME instruction;Gait  training;Stair training;Functional mobility training;Therapeutic activities;Therapeutic exercise;Balance training;Patient/family education    PT Goals (Current goals can be found in the Care Plan section)  Acute Rehab PT Goals Patient Stated Goal: return home with family to assist PT Goal Formulation: With patient/family Time For Goal Achievement: 07/26/24 Potential to Achieve Goals: Good    Frequency Min 3X/week     Co-evaluation PT/OT/SLP Co-Evaluation/Treatment: Yes Reason for Co-Treatment: To address functional/ADL transfers PT goals addressed during session: Mobility/safety with mobility;Balance;Proper use of DME OT goals addressed during session: ADL's and self-care       AM-PAC PT 6 Clicks Mobility  Outcome Measure Help needed turning from your back to your side while in a flat bed without using bedrails?: A Little Help needed moving from lying on your back to sitting on the side of a flat bed without using bedrails?: A Little Help needed moving to and from a bed to a chair (including a wheelchair)?: A Little Help needed standing up from a chair using your arms (e.g., wheelchair or bedside chair)?: A Little Help needed to walk in hospital room?: A Lot Help needed climbing 3-5 steps with a railing? : A Lot 6 Click Score: 16    End of Session Equipment Utilized During Treatment: Oxygen  Activity Tolerance: Patient tolerated treatment well;Patient limited by fatigue Patient left: in chair;with call bell/phone within reach;with family/visitor present Nurse Communication: Mobility status PT Visit Diagnosis: Unsteadiness on feet (R26.81);Other abnormalities of gait and mobility (R26.89);Muscle weakness (generalized) (  M62.81)    Time: 8993-8971 PT Time Calculation (min) (ACUTE ONLY): 22 min   Charges:   PT Evaluation $PT Eval Moderate Complexity: 1 Mod PT Treatments $Therapeutic Activity: 8-22 mins PT General Charges $$ ACUTE PT VISIT: 1 Visit         2:16 PM,  07/22/24 Lynwood Music, MPT Physical Therapist with [facility] [phone] office [phone] mobile phone

## 2024-07-22 NOTE — Progress Notes (Signed)
 Patient ID: Megan [name], female   DOB: 08-02-1961, 63 y.o.   MRN: [id number] [facility] Progress Note   Assessment/ Plan:   1.  Acute hypoxic respiratory failure: Multifactorial with acute bronchitis and pulmonary metastasis from breast cancer.  Recent imaging negative for PE and failed outpatient antibiotic therapy.  Currently improving with corticosteroids/antibiotics and bronchodilators. 2. ESRD: Usually on a MWF schedule and underwent hemodialysis yesterday with 2.5 L ultrafiltration.  No acute indication noted for additional dialysis today and will order for her next hemodialysis again tomorrow. 3. Anemia: Secondary to chronic illness including metastatic breast cancer.  Status post PRBC transfusion with corresponding improvement of hemoglobin/hematocrit.  Holding ESA in the setting of metastatic cancer. 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  Continue Cinacalcet  for PTH control. 5. Nutrition: Continue current heart healthy diet with oral nutrition supplementation. 6. Hypertension: Blood pressures currently at goal, continue to monitor 7.  Recurrent right-sided breast cancer with metastasis: Outpatient follow-up with oncology  Subjective:   Reports gradual improvement of her shortness of breath.  Continues to have cough without hemoptysis.   Objective:   BP (!) 144/73   Pulse (!) 108   Temp 98.1 F (36.7 C) (Oral)   Resp (!) 32   Ht 5' 2 (1.575 m)   Wt 72.4 kg   SpO2 100%   BMI 29.19 kg/m   Physical Exam: Gen: Comfortably resting in bed, husband at bedside CVS: Pulse regular tachycardia, S1 and S2 normal Resp: Decreased breath sounds with intermittent expiratory rhonchi.  No rales Abd: Soft, obese, nontender Ext: No lower extremity edema.  Intact dressing left radiocephalic fistula  Labs: BMET Recent Labs  Lab 07/18/24 1128 07/20/24 1420 07/21/24 0259  NA 140 139  137  K 3.2* 3.0* 3.9  CL 95* 95* 94*  CO2 31 25 30   GLUCOSE 95 110* 168*  BUN 9 25* 31*  CREATININE 2.89* 7.40* 7.91*  CALCIUM  8.4* 7.9* 8.0*   CBC Recent Labs  Lab 07/18/24 1128 07/20/24 1420 07/21/24 0259 07/22/24 0629  WBC 7.8 8.5 7.0  --   NEUTROABS 5.9 6.8  --   --   HGB 7.4* 7.1* 6.7* 8.1*  HCT 24.6* 23.6* 21.9* 26.2*  MCV 114.4* 115.7* 115.9*  --   PLT 243 255 250  --       Medications:     sodium chloride    Intravenous Once   sodium chloride    Intravenous Once   aspirin  EC  81 mg Oral Daily   benzonatate   200 mg Oral BID   Chlorhexidine  Gluconate Cloth  6 each Topical Q0600   cinacalcet   90 mg Oral Q breakfast   dextromethorphan -guaiFENesin   1 tablet Oral BID   diphenhydrAMINE   25 mg Intravenous Once   feeding supplement  237 mL Oral BID BM   gabapentin   300 mg Oral QHS   heparin  injection (subcutaneous)  5,000 Units Subcutaneous Q8H   insulin  aspart  0-5 Units Subcutaneous QHS   insulin  aspart  0-6 Units Subcutaneous TID WC   ipratropium-albuterol   3 mL Nebulization TID   lanthanum   3,000 mg Oral TID WC   methylPREDNISolone  (SOLU-MEDROL ) injection  40 mg Intravenous Q12H   midodrine   2.5 mg Oral Q M,W,F-HD   multivitamin  1 tablet Oral QHS   pantoprazole   40 mg Oral BID AC   sodium chloride  flush  3 mL Intravenous Q12H   sodium chloride  flush  3 mL Intravenous Q12H   Gordy Blanch, MD 07/22/2024, 8:25 AM

## 2024-07-22 NOTE — Evaluation (Signed)
 Occupational Therapy Evaluation Patient Details Name: Megan [name] MRN: [id number] DOB: 16-Sep-1960 Today's Date: 07/22/2024   History of Present Illness   Megan [name] is a 63 y.o. female with medical history significant history of  right-sided breast cancer with pulmonary metastases, mediastinal, hilar and axillary lymphadenopathy related to underlying breast cancer with pulmonary metastases --status post bilateral mastectomies with on chronic hypoxic respiratory failure on 2 L at home chronically , h/o ESRD on HD MWF, chronic diastolic CHF,  hypertension, dyslipidemia, DM2,  and GERD presents to the ED for difficulty in the last 48 hours with concerns about persistent cough and worsening respiratory distress. (per MD)     Clinical Impressions Pt agreeable to OT and PT co-evaluation. Pt's husband present and requesting to be the one to assist with mobility. CGA to min A for bed mobility. 2 hand held assist for EOB to chair transfer. Pt's B UE generally weak with mild A/ROM deficits. Pt is assisted for lower body dressing at home currently, but desires to be more independent as her strength improves. Pt''s HR noted to increase with just sitting at the EOB. Pt left in the chair with family present. Pt will benefit from continued OT in the hospital to increase strength, balance, and endurance for safe ADL's.        If plan is discharge home, recommend the following:   A little help with walking and/or transfers;A lot of help with bathing/dressing/bathroom;Assistance with cooking/housework;Assist for transportation;Help with stairs or ramp for entrance     Functional Status Assessment   Patient has had a recent decline in their functional status and demonstrates the ability to make significant improvements in function in a reasonable and predictable amount of time.     Equipment Recommendations   None recommended by OT             Precautions/Restrictions    Precautions Precautions: Fall Recall of Precautions/Restrictions: Intact Restrictions Weight Bearing Restrictions Per Provider Order: No     Mobility Bed Mobility Overal bed mobility: Needs Assistance Bed Mobility: Supine to Sit     Supine to sit: Contact guard, Min assist, HOB elevated     General bed mobility comments: labored movement    Transfers Overall transfer level: Needs assistance   Transfers: Sit to/from Stand, Bed to chair/wheelchair/BSC Sit to Stand: Min assist     Step pivot transfers: Min assist     General transfer comment: HHA by husband for EOB to chair transfer.      Balance Overall balance assessment: Needs assistance Sitting-balance support: No upper extremity supported, Feet supported Sitting balance-Leahy Scale: Fair Sitting balance - Comments: fair to good seated at EOB.   Standing balance support: Bilateral upper extremity supported, During functional activity Standing balance-Leahy Scale: Fair Standing balance comment: Fair with HHA.                           ADL either performed or assessed with clinical judgement   ADL Overall ADL's : Needs assistance/impaired     Grooming: Set up;Sitting   Upper Body Bathing: Set up;Sitting;Minimal assistance   Lower Body Bathing: Sitting/lateral leans;Moderate assistance   Upper Body Dressing : Set up;Sitting   Lower Body Dressing: Maximal assistance;Sitting/lateral leans   Toilet Transfer: Minimal assistance;Stand-pivot;Rolling walker (2 wheels) Toilet Transfer Details (indicate cue type and reason): EOB to chair with HHA by husband. Toileting- Clothing Manipulation and Hygiene: Set up;Sitting/lateral lean;Minimal assistance  Vision Baseline Vision/History: 1 Wears glasses Ability to See in Adequate Light: 1 Impaired Patient Visual Report: No change from baseline Vision Assessment?: No apparent visual deficits     Perception Perception: Not tested        Praxis Praxis: Not tested       Pertinent Vitals/Pain Pain Assessment Pain Assessment: Faces Faces Pain Scale: Hurts little more Pain Location: R arm with P/ROM Pain Descriptors / Indicators: Discomfort, Grimacing Pain Intervention(s): Limited activity within patient's tolerance, Monitored during session, Repositioned     Extremity/Trunk Assessment Upper Extremity Assessment Upper Extremity Assessment: Generalized weakness (3-/5 shoulder flexion bilaterally. 4/5 grossly otherwise.)   Lower Extremity Assessment Lower Extremity Assessment: Defer to PT evaluation   Cervical / Trunk Assessment Cervical / Trunk Assessment: Normal   Communication Communication Communication: No apparent difficulties   Cognition Arousal: Alert Behavior During Therapy: WFL for tasks assessed/performed Cognition: No apparent impairments                               Following commands: Intact       Cueing  General Comments   Cueing Techniques: Verbal cues  HR increased to >120 when first sitting at EOB. Pt off supplemental O2 at the start of the session, but was placed back on it once seated at EOB. Pt wears supplemental O2 at baseline.              Home Living Family/patient expects to be discharged to:: Private residence Living Arrangements: Spouse/significant other Available Help at Discharge: Family;Available 24 hours/day Type of Home: House Home Access: Stairs to enter [employer] of Steps: 1 Entrance Stairs-Rails: None Home Layout: One level     Bathroom Shower/Tub: Chief Strategy Officer: Standard Bathroom Accessibility: Yes How Accessible: Accessible via wheelchair;Accessible via walker Home Equipment: Wheelchair - manual;Shower seat;Rolling Environmental Consultant (2 wheels)          Prior Functioning/Environment Prior Level of Function : Needs assist       Physical Assist : ADLs (physical);Mobility (physical) Mobility (physical):  Transfers;Gait;Stairs ADLs (physical): Dressing;IADLs Mobility Comments: Supervised household ambulation without AD; HHA by husband. ADLs Comments: Initialy reported independence with ADL's, but later stated husband assists with dressing. Assisted by husband for IADL's.    OT Problem List: Decreased strength;Decreased range of motion;Decreased activity tolerance;Impaired balance (sitting and/or standing)   OT Treatment/Interventions: Self-care/ADL training;Therapeutic exercise;Therapeutic activities;Patient/family education;Balance training;Energy conservation;DME and/or AE instruction      OT Goals(Current goals can be found in the care plan section)   Acute Rehab OT Goals Patient Stated Goal: Return home. OT Goal Formulation: With patient/family Time For Goal Achievement: 08/05/24 Potential to Achieve Goals: Good   OT Frequency:  Min 2X/week    Co-evaluation PT/OT/SLP Co-Evaluation/Treatment: Yes Reason for Co-Treatment: To address functional/ADL transfers   OT goals addressed during session: ADL's and self-care                       End of Session Equipment Utilized During Treatment: Oxygen   Activity Tolerance: Patient tolerated treatment well Patient left: in chair;with call bell/phone within reach;with family/visitor present  OT Visit Diagnosis: Unsteadiness on feet (R26.81);Other abnormalities of gait and mobility (R26.89);Muscle weakness (generalized) (M62.81)                Time: 8992-8975 OT Time Calculation (min): 17 min Charges:  OT General Charges $OT Visit: 1 Visit OT Evaluation $OT Eval Low Complexity:  1 Low   Caili Escalera OT, MOT  Jayson Person 07/22/2024, 11:51 AM

## 2024-07-22 NOTE — Plan of Care (Signed)
  Problem: Acute Rehab PT Goals(only PT should resolve) Goal: Pt Will Go Supine/Side To Sit Outcome: Progressing Flowsheets (Taken 07/22/2024 1403) Pt will go Supine/Side to Sit: with modified independence Goal: Patient Will Transfer Sit To/From Stand Outcome: Progressing Flowsheets (Taken 07/22/2024 1403) Patient will transfer sit to/from stand: with supervision Goal: Pt Will Transfer Bed To Chair/Chair To Bed Outcome: Progressing Flowsheets (Taken 07/22/2024 1403) Pt will Transfer Bed to Chair/Chair to Bed: with supervision Goal: Pt Will Ambulate Outcome: Progressing Flowsheets (Taken 07/22/2024 1403) Pt will Ambulate:  25 feet  with supervision  with contact guard assist  with rolling walker  with least restrictive assistive device   2:03 PM, 07/22/24 Lynwood Music, MPT Physical Therapist with [facility] [phone] office [phone] mobile phone

## 2024-07-22 NOTE — Progress Notes (Signed)
 PROGRESS NOTE   Megan [name]  FMW:[id number] DOB: 03/25/1961 DOA: 07/20/2024 PCP: Duanne Butler DASEN, MD   Chief Complaint  Patient presents with   Cough   Level of care: Stepdown  Brief Admission History:  63 y.o. female with medical history significant history of  right-sided breast cancer with pulmonary metastases, mediastinal, hilar and axillary lymphadenopathy related to underlying breast cancer with pulmonary metastases --status post bilateral mastectomies with on chronic hypoxic respiratory failure on 2 L at home chronically , h/o ESRD on HD MWF, chronic diastolic CHF,  hypertension, dyslipidemia, DM2,  and GERD presents to the ED for difficulty in the last 48 hours with concerns about persistent cough and worsening respiratory distress. Seen in the ED on 07/19/2023 with similar complaints CTA chest at that time was without PE there was concerns for bronchitis patient was treated with Augmentin  and azithromycin  as well as antitussives however she returns with persistent respiratory symptoms specially cough and dyspnea.  She was admitted with acute COPD exacerbation, acute bronchitis and acute on chronic respiratory failure in an immunocompromised patient.    Assessment and Plan:  COPD with acute exacerbation -- In the setting of pulmonary metastasis- -- CTA chest from 07/18/24 and chest x-ray from 07/20/2024 noted -- Patient was seen in the ED on 07/19/2023 with similar complaints CTA chest at that time was without PE there was concerns for bronchitis patient was treated with Augmentin  and azithromycin  as well as antitussives however she returns today with persistent respiratory symptoms specially cough and dyspnea - No missed HD sessions -Patient with persistent cough, wheezing but improved from yesterday -Continue supplemental oxygen --baseline she requires 2 L at home currently up to 4 L. -Plan now to continue IV steroids, CTX/azithromycin  and bronchodilators and  mucolytic/antitussives as ordered  Acute on chronic respiratory failure with hypoxia -- In the setting of pulmonary metastasis -- CTA chest from 25 2025 and chest x-ray from 07/20/2024 noted -- Patient was seen in the ED on 07/19/2023 with similar complaints CTA chest at that time was without PE there was concerns for bronchitis patient was treated with Augmentin  and azithromycin  as well as antitussives however she returns today with persistent respiratory symptoms specially cough and dyspnea - No missed HD sessions -Patient with persistent cough, wheezing and respiratory distress -Continue supplemental oxygen  -IV steroids, CTX/azithromycin  and bronchodilators and mucolytic/antitussives as ordered  Bronchitis, acute, with bronchospasm -- In the setting of pulmonary metastasis- -CTA chest from 25 2025 and chest x-ray from 07/20/2024 noted - Patient was seen in the ED on 07/19/2023 with similar complaints CTA chest at that time was without PE there was concerns for bronchitis patient was treated with Augmentin  and azithromycin  as well as antitussives however she returns today with persistent respiratory symptoms specially cough and dyspnea - No missed HD sessions -Patient with persistent cough, wheezing and respiratory distress -continue treatments   Primary malignant neoplasm of central portion of right breast -- Imaging studies show with pulmonary metastases, mediastinal, hilar and axillary lymphadenopathy related to underlying breast cancer with pulmonary metastases --status post bilateral mastectomies -Interval decrease in size of innumerable bilateral pulmonary nodules, lobular right pleural thickening, and left axillary and mediastinal lymphadenopathy,  Slightly decreased cutaneous thickening of the left breast/anterior chest wall consistent with treatment response. - Outpatient follow-up with oncologist advised  Recurrent breast cancer, right -- Imaging studies show with pulmonary  metastases, mediastinal, hilar and axillary lymphadenopathy related to underlying breast cancer with pulmonary metastases --status post bilateral mastectomies -Interval decrease in size of  innumerable bilateral pulmonary nodules, lobular right pleural thickening, and left axillary and mediastinal lymphadenopathy,  Slightly decreased cutaneous thickening of the left breast/anterior chest wall consistent with treatment response. - Outpatient follow-up with oncologist advised -Palliative care consult for goals of care requested  ESRD (end stage renal disease) on dialysis  - Continue HD per MWF schedule - Nephrology consult requested  Anemia in chronic kidney disease Hgb down to 6.5 -Transfuse 1 unit of PRBC with HD and Hg up to 8.1 -Defer decision on ESA/Procrit to nephrology team -repeat CBC in AM   ESRD (end stage renal disease)  - Continue HD per MWF schedule - Nephrology consult requested and arranged for HD, had HD treatment 12/8  Malignant neoplasm of upper-inner quadrant of right female breast  -- Imaging studies show with pulmonary metastases, mediastinal, hilar and axillary lymphadenopathy related to underlying breast cancer with pulmonary metastases --status post bilateral mastectomies -Interval decrease in size of innumerable bilateral pulmonary nodules, lobular right pleural thickening, and left axillary and mediastinal lymphadenopathy,  Slightly decreased cutaneous thickening of the left breast/anterior chest wall consistent with treatment response. - Outpatient follow-up with oncologist advised - pt was due for chemotherapy 12/9, advised pt/husband to notify cancer center that she is currently in hospital with active infection and chemo and she was rescheduled.  Controlled Type 2 diabetes mellitus - Last A1c 5.3 reflecting excellent diabetic control Use Novolog /Humalog Sliding scale insulin  with Accu-Cheks/Fingersticks as ordered   GERD (gastroesophageal reflux disease) -  Stable, continue Protonix   Essential hypertension, benign - May use IV labetalol  as needed  DVT prophylaxis: sq heparin  Code Status: Full  Family Communication: husband at bedside  Disposition: return home with husband   Consultants:  nephrology Procedures:  HD 12/8  Antimicrobials:  Ceftriaxone   Azithromycin   Subjective: Pt reports improvements in her wheezing and shortness of breath, cough persists but the cough meds are helping, tolerated HD with no difficulties  Objective: Vitals:   07/22/24 0900 07/22/24 1000 07/22/24 1100 07/22/24 1121  BP: 129/62 132/67 (!) 140/84   Pulse: (!) 115 (!) 123    Resp:      Temp:    97.9 F (36.6 C)  TempSrc:    Oral  SpO2: 92% 94%    Weight:      Height:        Intake/Output Summary (Last 24 hours) at 07/22/2024 1306 Last data filed at 07/22/2024 1100 Gross per 24 hour  Intake 625.88 ml  Output 2500 ml  Net -1874.12 ml   Filed Weights   07/21/24 0452 07/21/24 1322 07/22/24 0300  Weight: 74.8 kg 72.3 kg 72.4 kg   Examination:  General exam: Appears calm with frequent dry cough.   Respiratory system: rare wheezing, no increased work of breathing.   Cardiovascular system: tachycardic, normal S1 & S2 heard. No JVD, murmurs, rubs, gallops or clicks. No pedal edema. Gastrointestinal system: Abdomen is nondistended, soft and nontender. No organomegaly or masses felt. Normal bowel sounds heard. Central nervous system: Alert and oriented. No focal neurological deficits. Extremities: Symmetric 5 x 5 power. Skin: No rashes, lesions or ulcers. Psychiatry: Judgement and insight appear normal. Mood & affect appropriate.   Data Reviewed: I have personally reviewed following labs and imaging studies  CBC: Recent Labs  Lab 07/18/24 1128 07/20/24 1420 07/21/24 0259 07/22/24 0629  WBC 7.8 8.5 7.0  --   NEUTROABS 5.9 6.8  --   --   HGB 7.4* 7.1* 6.7* 8.1*  HCT 24.6* 23.6* 21.9* 26.2*  MCV  114.4* 115.7* 115.9*  --   PLT 243 255 250   --     Basic Metabolic Panel: Recent Labs  Lab 07/18/24 1128 07/20/24 1420 07/21/24 0259  NA 140 139 137  K 3.2* 3.0* 3.9  CL 95* 95* 94*  CO2 31 25 30   GLUCOSE 95 110* 168*  BUN 9 25* 31*  CREATININE 2.89* 7.40* 7.91*  CALCIUM  8.4* 7.9* 8.0*  MG 2.0  --   --     CBG: Recent Labs  Lab 07/21/24 1608 07/21/24 2118 07/22/24 0009 07/22/24 0749 07/22/24 1109  GLUCAP 118* 155* 150* 114* 134*    Recent Results (from the past 240 hours)  Blood Culture (routine x 2)     Status: None (Preliminary result)   Collection Time: 07/20/24  2:20 PM   Specimen: BLOOD  Result Value Ref Range Status   Specimen Description BLOOD POWER PORT  Final   Special Requests NONE  Final   Culture   Final    NO GROWTH 2 DAYS Performed at [facility], [address], [place], [place] [address]    Report Status PENDING  Incomplete  Blood Culture (routine x 2)     Status: None (Preliminary result)   Collection Time: 07/20/24  2:21 PM   Specimen: BLOOD  Result Value Ref Range Status   Specimen Description BLOOD BLOOD RIGHT ARM  Final   Special Requests NONE  Final   Culture   Final    NO GROWTH 2 DAYS Performed at [facility], [address], [place], [place] [address]    Report Status PENDING  Incomplete  Resp panel by RT-PCR (RSV, Flu A&B, Covid) Anterior Nasal Swab     Status: None   Collection Time: 07/20/24  2:30 PM   Specimen: Anterior Nasal Swab  Result Value Ref Range Status   SARS Coronavirus 2 by RT PCR NEGATIVE NEGATIVE Final    Comment: (NOTE) SARS-CoV-2 target nucleic acids are NOT DETECTED.  The SARS-CoV-2 RNA is generally detectable in upper respiratory specimens during the acute phase of infection. The lowest concentration of SARS-CoV-2 viral copies this assay can detect is 138 copies/mL. A negative result does not preclude SARS-Cov-2 infection and should not be used as the sole basis for treatment or other patient management decisions. A negative result may occur with   improper specimen collection/handling, submission of specimen other than nasopharyngeal swab, presence of viral mutation(s) within the areas targeted by this assay, and inadequate number of viral copies(<138 copies/mL). A negative result must be combined with clinical observations, patient history, and epidemiological information. The expected result is Negative.  Fact Sheet for Patients:  bloggercourse.com  Fact Sheet for Healthcare Providers:  seriousbroker.it  This test is no t yet approved or cleared by the [place]  FDA and  has been authorized for detection and/or diagnosis of SARS-CoV-2 by FDA under an Emergency Use Authorization (EUA). This EUA will remain  in effect (meaning this test can be used) for the duration of the COVID-19 declaration under Section 564(b)(1) of the Act, 21 U.S.C.section 360bbb-3(b)(1), unless the authorization is terminated  or revoked sooner.       Influenza A by PCR NEGATIVE NEGATIVE Final   Influenza B by PCR NEGATIVE NEGATIVE Final    Comment: (NOTE) The Xpert Xpress SARS-CoV-2/FLU/RSV plus assay is intended as an aid in the diagnosis of influenza from Nasopharyngeal swab specimens and should not be used as a sole basis for treatment. Nasal washings and aspirates are unacceptable for Xpert  Xpress SARS-CoV-2/FLU/RSV testing.  Fact Sheet for Patients: bloggercourse.com  Fact Sheet for Healthcare Providers: seriousbroker.it  This test is not yet approved or cleared by the [place]  FDA and has been authorized for detection and/or diagnosis of SARS-CoV-2 by FDA under an Emergency Use Authorization (EUA). This EUA will remain in effect (meaning this test can be used) for the duration of the COVID-19 declaration under Section 564(b)(1) of the Act, 21 U.S.C. section 360bbb-3(b)(1), unless the authorization is terminated or revoked.      Resp Syncytial Virus by PCR NEGATIVE NEGATIVE Final    Comment: (NOTE) Fact Sheet for Patients: bloggercourse.com  Fact Sheet for Healthcare Providers: seriousbroker.it  This test is not yet approved or cleared by the [place]  FDA and has been authorized for detection and/or diagnosis of SARS-CoV-2 by FDA under an Emergency Use Authorization (EUA). This EUA will remain in effect (meaning this test can be used) for the duration of the COVID-19 declaration under Section 564(b)(1) of the Act, 21 U.S.C. section 360bbb-3(b)(1), unless the authorization is terminated or revoked.  Performed at [facility], [address] [place], [place] [address]   MRSA Next Gen by PCR, Nasal     Status: None   Collection Time: 07/20/24  5:35 PM   Specimen: Nasal Mucosa; Nasal Swab  Result Value Ref Range Status   MRSA by PCR Next Gen NOT DETECTED NOT DETECTED Final    Comment: (NOTE) The GeneXpert MRSA Assay (FDA approved for NASAL specimens only), is one component of a comprehensive MRSA colonization surveillance program. It is not intended to diagnose MRSA infection nor to guide or monitor treatment for MRSA infections. Test performance is not FDA approved in patients less than 15 years old. Performed at [facility], [address], [place], [place] [address]      Radiology Studies: [facility] Chest [facility] 1 View Result Date: 07/20/2024 CLINICAL DATA:  Cough and shortness of breath, history of metastatic breast cancer EXAM: PORTABLE CHEST 1 VIEW COMPARISON:  07/18/2024 FINDINGS: Single frontal view of the chest demonstrates stable right chest wall port. Cardiac silhouette is unremarkable. Innumerable bilateral pulmonary metastases are again noted, without significant change since the previous exam. No new airspace disease, effusion, or pneumothorax. No acute displaced fracture. IMPRESSION: 1. Stable diffuse pulmonary metastases. 2. No acute airspace  disease. Electronically Signed   By: Ozell Daring M.D.   On: 07/20/2024 14:37   Scheduled Meds:  sodium chloride    Intravenous Once   sodium chloride    Intravenous Once   aspirin  EC  81 mg Oral Daily   benzonatate   200 mg Oral BID   Chlorhexidine  Gluconate Cloth  6 each Topical Q0600   cinacalcet   90 mg Oral Q breakfast   dextromethorphan -guaiFENesin   1 tablet Oral BID   diphenhydrAMINE   25 mg Intravenous Once   feeding supplement  237 mL Oral BID BM   gabapentin   300 mg Oral QHS   heparin  injection (subcutaneous)  5,000 Units Subcutaneous Q8H   insulin  aspart  0-5 Units Subcutaneous QHS   insulin  aspart  0-6 Units Subcutaneous TID WC   ipratropium-albuterol   3 mL Nebulization TID   lanthanum   3,000 mg Oral TID WC   methylPREDNISolone  (SOLU-MEDROL ) injection  40 mg Intravenous Q12H   midodrine   2.5 mg Oral Q M,W,F-HD   multivitamin  1 tablet Oral QHS   pantoprazole   40 mg Oral BID AC   sodium chloride  flush  3 mL Intravenous Q12H   sodium chloride  flush  3 mL  Intravenous Q12H   Continuous Infusions:  azithromycin  Stopped (07/22/24 1040)     LOS: 2 days   Critical Care Procedure Note Authorized and Performed by: KYM Louder MD  Total Critical Care time:  58 mins Due to a high probability of clinically significant, life threatening deterioration, the patient required my highest level of preparedness to intervene emergently and I personally spent this critical care time directly and personally managing the patient.  This critical care time included obtaining a history; examining the patient, pulse oximetry; ordering and review of studies; arranging urgent treatment with development of a management plan; evaluation of patient's response of treatment; frequent reassessment; and discussions with other providers.  This critical care time was performed to assess and manage the high probability of imminent and life threatening deterioration that could result in multi-organ failure.  It was  exclusive of separately billable procedures and treating other patients and teaching time.   Afton Louder, MD How to contact the [facility] Attending or Consulting provider 7A - 7P or covering provider during after hours 7P -7A, for this patient?  Check the care team in [facility] and look for a) attending/consulting TR[facility] provider listed and b) the [facility] team listed Log into [email or web address] to find provider on call.  Locate the [facility] provider you are looking for under [facility] and page to a number that you can be directly reached. If you still have difficulty reaching the provider, please page the [facility] (Director on Call) for the Hospitalists listed on amion for assistance.  07/22/2024, 1:06 PM

## 2024-07-22 NOTE — Plan of Care (Signed)
  Problem: Acute Rehab OT Goals (only OT should resolve) Goal: Pt. Will Perform Grooming Flowsheets (Taken 07/22/2024 1153) Pt Will Perform Grooming:  with modified independence  standing Goal: Pt. Will Perform Upper Body Dressing Flowsheets (Taken 07/22/2024 1153) Pt Will Perform Upper Body Dressing:  with modified independence  sitting Goal: Pt. Will Perform Lower Body Dressing Flowsheets (Taken 07/22/2024 1153) Pt Will Perform Lower Body Dressing:  with modified independence  with adaptive equipment  sitting/lateral leans Goal: Pt. Will Transfer To Toilet Flowsheets (Taken 07/22/2024 1153) Pt Will Transfer to Toilet:  with modified independence  ambulating Goal: Pt. Will Perform Toileting-Clothing Manipulation Flowsheets (Taken 07/22/2024 1153) Pt Will Perform Toileting - Clothing Manipulation and hygiene: with modified independence Goal: Pt/Caregiver Will Perform Home Exercise Program Flowsheets (Taken 07/22/2024 1153) Pt/caregiver will Perform Home Exercise Program:  Increased ROM  Increased strength  Both right and left upper extremity  Independently  Aribella Vavra OT, MOT

## 2024-07-23 DIAGNOSIS — C50111 Malignant neoplasm of central portion of right female breast: Secondary | ICD-10-CM

## 2024-07-23 LAB — CBC
HCT: 28.9 % — ABNORMAL LOW (ref 36.0–46.0)
Hemoglobin: 8.6 g/dL — ABNORMAL LOW (ref 12.0–15.0)
MCH: 32.6 pg (ref 26.0–34.0)
MCHC: 29.8 g/dL — ABNORMAL LOW (ref 30.0–36.0)
MCV: 109.5 fL — ABNORMAL HIGH (ref 80.0–100.0)
Platelets: 307 K/uL (ref 150–400)
RBC: 2.64 MIL/uL — ABNORMAL LOW (ref 3.87–5.11)
RDW: 24.8 % — ABNORMAL HIGH (ref 11.5–15.5)
WBC: 8.4 K/uL (ref 4.0–10.5)
nRBC: 0.7 % — ABNORMAL HIGH (ref 0.0–0.2)

## 2024-07-23 LAB — RENAL FUNCTION PANEL
Albumin: 3.4 g/dL — ABNORMAL LOW (ref 3.5–5.0)
Anion gap: 18 — ABNORMAL HIGH (ref 5–15)
BUN: 35 mg/dL — ABNORMAL HIGH (ref 8–23)
CO2: 23 mmol/L (ref 22–32)
Calcium: 8.5 mg/dL — ABNORMAL LOW (ref 8.9–10.3)
Chloride: 99 mmol/L (ref 98–111)
Creatinine, Ser: 6.49 mg/dL — ABNORMAL HIGH (ref 0.44–1.00)
GFR, Estimated: 7 mL/min — ABNORMAL LOW (ref >60)
Glucose, Bld: 100 mg/dL — ABNORMAL HIGH (ref 70–99)
Phosphorus: 4.1 mg/dL (ref 2.5–4.6)
Potassium: 3.6 mmol/L (ref 3.5–5.1)
Sodium: 140 mmol/L (ref 135–145)

## 2024-07-23 LAB — GLUCOSE, CAPILLARY
Glucose-Capillary: 95 mg/dL (ref 70–99)
Glucose-Capillary: 98 mg/dL (ref 70–99)

## 2024-07-23 MED ORDER — BENZONATATE 200 MG PO CAPS: 200.0000 mg | ORAL_CAPSULE | Freq: Two times a day (BID) | ORAL | 0 refills | Status: DC

## 2024-07-23 MED ORDER — HEPARIN SODIUM (PORCINE) 1000 UNIT/ML DIALYSIS
40.0000 [IU]/kg | INTRAMUSCULAR | Status: DC | PRN
  Administered 2024-07-23: 2900 [IU] via INTRAVENOUS_CENTRAL

## 2024-07-23 MED ORDER — CEFDINIR 300 MG PO CAPS: 300.0000 mg | ORAL_CAPSULE | Freq: Every day | ORAL | 0 refills | Status: AC

## 2024-07-23 MED ORDER — PREDNISONE 20 MG PO TABS: 50.0000 mg | ORAL_TABLET | Freq: Every day | ORAL | Status: DC

## 2024-07-23 MED ORDER — AZITHROMYCIN 500 MG PO TABS: 500.0000 mg | ORAL_TABLET | Freq: Every day | ORAL | 0 refills | Status: AC

## 2024-07-23 MED ORDER — PREDNISONE 10 MG PO TABS: 50.0000 mg | ORAL_TABLET | Freq: Every day | ORAL | 0 refills | Status: AC

## 2024-07-23 MED ORDER — HEPARIN SOD (PORK) LOCK FLUSH 100 UNIT/ML IV SOLN
500.0000 [IU] | Freq: Once | INTRAVENOUS | Status: AC
  Administered 2024-07-23: 500 [IU] via INTRAVENOUS
  Filled 2024-07-23: qty 5

## 2024-07-23 NOTE — TOC Transition Note (Signed)
 Transition of Care [facility]) - Discharge Note   Patient Details  Name: Megan [name] MRN: [id number] Date of Birth: Aug 14, 1961  [facility]) CM/SW Contact:  Sharlyne Stabs, RN Phone Number: 07/23/2024, 12:34 PM   Clinical Narrative:   Patient discharging home with [facility]. Orders placed, [place] updated.    Final next level of care: Home w Home Health Services Barriers to Discharge: Barriers Resolved   Patient Goals and CMS Choice Patient states their goals for this hospitalization and ongoing recovery are:: return home CMS Medicare.gov Compare Post Acute Care list provided to:: Patient Represenative (must comment) Choice offered to / list presented to : Spouse      Discharge Placement        Patient and family notified of of transfer: 07/23/24  Discharge Plan and Services Additional resources added to the After Visit Summary for          [facility] Agency: [facility]        Social Drivers of Health (SDOH) Interventions SDOH Screenings   Food Insecurity: No Food Insecurity (07/20/2024)  Housing: Low Risk  (07/20/2024)  Transportation Needs: No Transportation Needs (07/20/2024)  Utilities: Not At Risk (07/20/2024)  Alcohol Screen: Low Risk  (09/20/2023)  Depression (PHQ2-9): Low Risk  (07/01/2024)  Financial Resource Strain: Low Risk  (09/20/2023)  Physical Activity: Inactive (09/20/2023)  Social Connections: Unknown (06/08/2024)  Stress: No Stress Concern Present (09/20/2023)  Tobacco Use: Low Risk  (07/20/2024)     Readmission Risk Interventions    07/21/2024    2:17 PM 06/09/2024    4:05 PM 06/08/2024    8:34 AM  Readmission Risk Prevention Plan  Transportation Screening Complete Complete Complete  Medication Review Oceanographer) Complete Complete Complete  PCP or Specialist appointment within 3-5 days of discharge Not Complete  Complete  HRI or Home Care Consult Complete Complete Complete  SW Recovery  Care/Counseling Consult Complete Complete Complete  Palliative Care Screening Not Complete Not Applicable Not Applicable  Skilled Nursing Facility Not Applicable Not Applicable Not Applicable

## 2024-07-23 NOTE — Progress Notes (Signed)
 Noted that pt will likely receive dialysis on day of discharge, contacted out-pt HD clinic to first see if they have any availability to fit her in schedule today or tomorrow instead, they do not, clinic is full at this time.   Journei [place]assen Dialysis Nav [phone]

## 2024-07-23 NOTE — Care Management Important Message (Addendum)
 Important Message  Patient Details  Name: Megan [name] MRN: [id number] Date of Birth: Jan 18, 1961   Important Message Given: given by admissions on 07/21/24     [name] 07/23/2024, 12:58 PM

## 2024-07-23 NOTE — Progress Notes (Signed)
 D/c orders noted. Contacted [facility] to inform of pt d/c and anticipated arrival back to clinic on Friday as scheduled. No further support needed.   Lavanda Adalida Garver Dialysis Navigator [phone]

## 2024-07-23 NOTE — Progress Notes (Signed)
 Patient ID: Megan [name], female   DOB: 23-Apr-1961, 63 y.o.   MRN: [id number] [facility] Progress Note   Assessment/ Plan:   1.  Acute hypoxic respiratory failure: Multifactorial with acute bronchitis and pulmonary metastasis from breast cancer.  Recent imaging negative for PE and failed outpatient antibiotic therapy.  Currently improving with corticosteroids/antibiotics and bronchodilators. I also gave additional instruction on how to maximize benefit from the inhalers when she goes home. 2. ESRD: Usually on a MWF schedule and underwent hemodialysis Monday with 2.5 L ultrafiltration; on for treatment today back to her MWF regimen. Appreciate renal navigator working with the pt and outpt center.  3. Anemia: Secondary to chronic illness including metastatic breast cancer.  Status post PRBC transfusion with corresponding improvement of hemoglobin/hematocrit.  Holding ESA in the setting of metastatic cancer. 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  Continue Cinacalcet  for PTH control. 5. Nutrition: Continue current heart healthy diet with oral nutrition supplementation. 6. Hypertension: Blood pressures currently at goal, continue to monitor 7.  Recurrent right-sided breast cancer with metastasis: Outpatient follow-up with oncology  Subjective:   Reports gradual improvement of her shortness of breath.  Continues to have cough without hemoptysis but she feels that it's slowly getting better.   Objective:   BP (!) 144/65   Pulse (!) 104   Temp 97.9 F (36.6 C) (Oral)   Resp (!) 24   Ht 5' 2 (1.575 m)   Wt 73.7 kg   SpO2 100%   BMI 29.72 kg/m   Physical Exam: Gen: Comfortably resting in bed, husband at bedside CVS: Pulse regular tachycardia, S1 and S2 normal Resp: Decreased breath sounds with intermittent expiratory rhonchi.  No rales Abd: Soft, obese, nontender Ext: No lower  extremity edema.  Intact dressing left radiocephalic fistula, great thrill, no hyperpulsatile segments  Labs: BMET Recent Labs  Lab 07/18/24 1128 07/20/24 1420 07/21/24 0259 07/23/24 0653  NA 140 139 137 140  K 3.2* 3.0* 3.9 3.6  CL 95* 95* 94* 99  CO2 31 25 30 23   GLUCOSE 95 110* 168* 100*  BUN 9 25* 31* 35*  CREATININE 2.89* 7.40* 7.91* 6.49*  CALCIUM  8.4* 7.9* 8.0* 8.5*  PHOS  --   --   --  4.1   CBC Recent Labs  Lab 07/18/24 1128 07/20/24 1420 07/21/24 0259 07/22/24 0629 07/23/24 0653  WBC 7.8 8.5 7.0  --  8.4  NEUTROABS 5.9 6.8  --   --   --   HGB 7.4* 7.1* 6.7* 8.1* 8.6*  HCT 24.6* 23.6* 21.9* 26.2* 28.9*  MCV 114.4* 115.7* 115.9*  --  109.5*  PLT 243 255 250  --  307      Medications:     sodium chloride    Intravenous Once   sodium chloride    Intravenous Once   aspirin  EC  81 mg Oral Daily   benzonatate   200 mg Oral BID   Chlorhexidine  Gluconate Cloth  6 each Topical Q0600   cinacalcet   90 mg Oral Q breakfast   dextromethorphan -guaiFENesin   1 tablet Oral BID   diphenhydrAMINE   25 mg Intravenous Once   feeding supplement  237 mL Oral BID BM   gabapentin   300 mg Oral QHS   heparin  injection (subcutaneous)  5,000 Units Subcutaneous Q8H   insulin  aspart  0-5 Units Subcutaneous QHS   insulin  aspart  0-6 Units Subcutaneous TID WC   ipratropium-albuterol   3 mL Nebulization BID   lanthanum   3,000 mg Oral TID WC   methylPREDNISolone  (SOLU-MEDROL ) injection  40 mg Intravenous Q12H   midodrine   2.5 mg Oral Q M,W,F-HD   multivitamin  1 tablet Oral QHS   pantoprazole   40 mg Oral BID AC   sodium chloride  flush  3 mL Intravenous Q12H   sodium chloride  flush  3 mL Intravenous Q12H

## 2024-07-23 NOTE — Progress Notes (Signed)
 Pt asked a little after midnight for BP cuff to be removed, she said she had worn it all day and would like a break to sleep. BP cuff removed per patient request, will place back on before end of shift.

## 2024-07-23 NOTE — Progress Notes (Addendum)
 1404: RN de-accessed right chest port. There are two chest ports listed for this patient. One from 04/03/24 and 06/12/24. Patient stated the one from 04/03/24 was removed due to infection and then a new port was placed on 06/12/24. This RN de-accessed the port that was placed on 06/12/24 using heparin  flush. 20 g needle removed and intact. No bleeding at site. Patient tolerated well.  Patient was discharged at 1420.

## 2024-07-23 NOTE — Procedures (Signed)
 Pt in ICU in bed. Alert and oriented.  Informed consent signed and in chart.   TX duration:3hrs 15 mins  Patient tolerated well.  Transported back to the room  Alert, without acute distress.  Hand-off given to patient's nurse.   Access used: L AVF Access issues: None  Total UF removed: 1.2L Medication(s) given: None Post HD weight: 73.3kg Post HD VS: 159/91   Rubi Tooley [facility]

## 2024-07-23 NOTE — Discharge Summary (Signed)
 Physician Discharge Summary   Patient: Megan [name] MRN: [id number] DOB: 1961/04/02  Admit date:     07/20/2024  Discharge date: 07/23/24  Discharge Physician: Alm Alvester Eads   PCP: Duanne Butler DASEN, MD   Recommendations at discharge:   Please follow up with primary care provider within 1-2 weeks  Please repeat BMP and CBC in one week     Hospital Course: 62 y.o. female with medical history significant history of  right-sided breast cancer with pulmonary metastases, mediastinal, hilar and axillary lymphadenopathy related to underlying breast cancer with pulmonary metastases --status post bilateral mastectomies with on chronic hypoxic respiratory failure on 2 L at home chronically , h/o ESRD on HD MWF, chronic diastolic CHF,  hypertension, dyslipidemia, DM2,  and GERD presents to the ED for difficulty in the last 48 hours with concerns about persistent cough and worsening respiratory distress. Seen in the ED on 07/19/2023 with similar complaints CTA chest at that time was without PE there was concerns for bronchitis patient was treated with Augmentin  and azithromycin  as well as antitussives however she returns with persistent respiratory symptoms specially cough and dyspnea.  She was admitted with acute COPD exacerbation, acute bronchitis and acute on chronic respiratory failure in an immunocompromised patient.   Assessment and Plan: COPD with acute exacerbation -- In the setting of pulmonary metastasis- -- CTA chest from 07/18/24 and chest x-ray from 07/20/2024 noted -- Patient was seen in the ED on 07/19/2023 with similar complaints CTA chest at that time was without PE there was concerns for bronchitis patient was treated with Augmentin  and azithromycin  as well as antitussives however she returns today with persistent respiratory symptoms specially cough and dyspnea - No missed HD sessions -Patient with persistent cough, wheezing but improved from yesterday -Continue supplemental  oxygen --baseline she requires 2 L at home currently up to 4 L. -Plan now to continue IV steroids, CTX/azithromycin  and bronchodilators and mucolytic/antitussives as ordered -Discharge home with prednisone  taper, 3 more days of cefdinir  and azithromycin    Acute on chronic respiratory failure with hypoxia -- In the setting of pulmonary metastasis -- CTA chest from 25 2025 and chest x-ray from 07/20/2024 noted -- Patient was seen in the ED on 07/19/2023 with similar complaints CTA chest at that time was without PE there was concerns for bronchitis patient was treated with Augmentin  and azithromycin  as well as antitussives however she returns today with persistent respiratory symptoms specially cough and dyspnea - No missed HD sessions -Patient with persistent cough, wheezing and respiratory distress -Continue supplemental oxygen  -IV steroids, CTX/azithromycin  and bronchodilators and mucolytic/antitussives as ordered - Patient was weaned down to her baseline oxygen , 2 L with saturation 100%.   Bronchitis, acute, with bronchospasm -- In the setting of pulmonary metastasis- -CTA chest from 25 2025 and chest x-ray from 07/20/2024 noted - Patient was seen in the ED on 07/19/2023 with similar complaints CTA chest at that time was without PE there was concerns for bronchitis patient was treated with Augmentin  and azithromycin  as well as antitussives however she returns today with persistent respiratory symptoms specially cough and dyspnea - No missed HD sessions -Patient with persistent cough, wheezing and respiratory distress - Discharged with insulin  taper and azithromycin /cefdinir  3 more days.   Primary malignant neoplasm of central portion of right breast -- 07/18/2024 CTA chest pulmonary metastases, mediastinal, hilar and axillary lymphadenopathy related to underlying breast cancer with pulmonary metastases --status post bilateral mastectomies -Interval decrease in size of innumerable bilateral  pulmonary nodules, lobular right pleural  thickening, and left axillary and mediastinal lymphadenopathy,  Slightly decreased cutaneous thickening of the left breast/anterior chest wall consistent with treatment response. - Outpatient follow-up with oncologist advised   Recurrent breast cancer, right -- Imaging studies show with pulmonary metastases, mediastinal, hilar and axillary lymphadenopathy related to underlying breast cancer with pulmonary metastases --status post bilateral mastectomies -Interval decrease in size of innumerable bilateral pulmonary nodules, lobular right pleural thickening, and left axillary and mediastinal lymphadenopathy,  Slightly decreased cutaneous thickening of the left breast/anterior chest wall consistent with treatment response. - Outpatient follow-up with oncologist advised -Palliative care consult for goals of care requested   ESRD (end stage renal disease) on dialysis  - Continue HD per MWF schedule - Nephrology consult requested -Patient received dialysis on 07/23/2024 prior to dischargeAnemia in chronic kidney disease Hgb down to 6.5 -Transfuse 1 unit of PRBC with HD and Hg up to 8.1 -Defer decision on ESA/Procrit to nephrology team -repeat CBC in AM    Malignant neoplasm of upper-inner quadrant of right female breast  -- Imaging studies show with pulmonary metastases, mediastinal, hilar and axillary lymphadenopathy related to underlying breast cancer with pulmonary metastases --status post bilateral mastectomies -Interval decrease in size of innumerable bilateral pulmonary nodules, lobular right pleural thickening, and left axillary and mediastinal lymphadenopathy,  Slightly decreased cutaneous thickening of the left breast/anterior chest wall consistent with treatment response. - Outpatient follow-up with oncologist advised - pt was due for chemotherapy 12/9, advised pt/husband to notify cancer center that she is currently in hospital with active  infection and chemo and she was rescheduled.   Controlled Type 2 diabetes mellitus - Last A1c 5.3 reflecting excellent diabetic control Use Novolog /Humalog Sliding scale insulin  with Accu-Cheks/Fingersticks as ordered    GERD (gastroesophageal reflux disease) - Stable, continue Protonix    Essential hypertension, benign - May use IV labetalol  as needed       Consultants: renal Procedures performed: none  Disposition: Home Diet recommendation:  Renal diet DISCHARGE MEDICATION: Allergies as of 07/23/2024       Reactions   Motrin [ibuprofen] Other (See Comments)   End stage renal disease    Trodelvy  [sacituzumab Govitecan -hziy] Other (See Comments)   Wheezing         Medication List     STOP taking these medications    amoxicillin -clavulanate 875-125 MG tablet Commonly known as: AUGMENTIN    chlorpheniramine-HYDROcodone  10-8 MG/5ML Commonly known as: TUSSIONEX       TAKE these medications    acetaminophen  500 MG tablet Commonly known as: TYLENOL  Take 1,000 mg by mouth every 8 (eight) hours as needed for moderate pain (pain score 4-6).   albuterol  (2.5 MG/3ML) 0.083% nebulizer solution Commonly known as: PROVENTIL  Take 3 mLs (2.5 mg total) by nebulization every 4 (four) hours as needed for wheezing or shortness of breath.   albuterol  108 (90 Base) MCG/ACT inhaler Commonly known as: VENTOLIN  HFA Inhale 2 puffs into the lungs every 6 (six) hours as needed for wheezing or shortness of breath.   ARANESP  (ALBUMIN  FREE) IJ Inject 200 mcg into the vein every Friday.   aspirin  EC 81 MG tablet Take 81 mg by mouth daily.   azithromycin  500 MG tablet Commonly known as: ZITHROMAX  Take 1 tablet (500 mg total) by mouth daily. Start taking on: July 24, 2024 What changed:  medication strength how much to take additional instructions   benzonatate  200 MG capsule Commonly known as: TESSALON  Take 1 capsule (200 mg total) by mouth 2 (two) times daily.  cefdinir  300 MG capsule Commonly known as: OMNICEF  Take 1 capsule (300 mg total) by mouth daily. Start taking on: July 24, 2024   cinacalcet  90 MG tablet Commonly known as: SENSIPAR  Take 90 mg by mouth daily.   Compressor/Nebulizer Misc 1 Units by Does not apply route daily as needed.   Dialyvite  800-Zinc  15 0.8 MG Tabs Take 1 tablet by mouth daily.   diphenoxylate -atropine  2.5-0.025 MG tablet Commonly known as: LOMOTIL  Take 1 tablet by mouth 4 (four) times daily as needed for diarrhea or loose stools.   gabapentin  300 MG capsule Commonly known as: NEURONTIN  TAKE 1 CAPSULE(300 MG) BY MOUTH AT BEDTIME   lanthanum  1000 MG chewable tablet Commonly known as: FOSRENOL  Chew 2,000-3,000 mg by mouth See admin instructions. Take 3 tablets (3000 mg) by mouth with meals and take 2 tablets (2000 mg) with snacks   lidocaine -prilocaine  cream Commonly known as: EMLA  Apply to affected area once What changed:  how much to take how to take this when to take this   midodrine  2.5 MG tablet Commonly known as: PROAMATINE  Take 2.5 mg by mouth See admin instructions. Monday,Wednesday and Friday Only take on dialysis days   nystatin  100000 UNIT/ML suspension Commonly known as: MYCOSTATIN  Take 5 mLs (500,000 Units total) by mouth 4 (four) times daily. Swish and swallow   ondansetron  8 MG tablet Commonly known as: Zofran  Take 1 tablet (8 mg total) by mouth every 8 (eight) hours as needed for nausea or vomiting. Start on the third day after chemotherapy.   pantoprazole  40 MG tablet Commonly known as: PROTONIX  Take 1 tablet (40 mg total) by mouth 2 (two) times daily before a meal.   predniSONE  10 MG tablet Commonly known as: DELTASONE  Take 5 tablets (50 mg total) by mouth daily with breakfast. And decrease by one tablet daily Start taking on: July 24, 2024        Follow-up Information     [facility]) Follow up.   Specialty: Home Health  Services Why: Home health will call to schedule your next home visit. Contact information: [address] [place]  [address] [phone]               Discharge Exam: Filed Weights   07/22/24 0300 07/23/24 0426 07/23/24 0921  Weight: 72.4 kg 73.7 kg 75.5 kg   HEENT:  [place]/AT, No thrush, no icterus CV:  RRR, no rub, no S3, no S4 Lung: Bibasal rales.  No wheezing.  Good air movement Abd:  soft/+BS, NT Ext:  No edema, no lymphangitis, no synovitis, no rash   Condition at discharge: stable  The results of significant diagnostics from this hospitalization (including imaging, microbiology, ancillary and laboratory) are listed below for reference.   Imaging Studies: DG Chest Port 1 View Result Date: 07/20/2024 CLINICAL DATA:  Cough and shortness of breath, history of metastatic breast cancer EXAM: PORTABLE CHEST 1 VIEW COMPARISON:  07/18/2024 FINDINGS: Single frontal view of the chest demonstrates stable right chest wall port. Cardiac silhouette is unremarkable. Innumerable bilateral pulmonary metastases are again noted, without significant change since the previous exam. No new airspace disease, effusion, or pneumothorax. No acute displaced fracture. IMPRESSION: 1. Stable diffuse pulmonary metastases. 2. No acute airspace disease. Electronically Signed   By: Ozell Daring M.D.   On: 07/20/2024 14:37   CT Angio Chest PE W and/or Wo Contrast Result Date: 07/18/2024 CLINICAL DATA:  Metastatic breast cancer with lung and liver metastases. Several week history of cough and shortness  of breath EXAM: CT ANGIOGRAPHY CHEST WITH CONTRAST TECHNIQUE: Multidetector CT imaging of the chest was performed using the standard protocol during bolus administration of intravenous contrast. Multiplanar CT image reconstructions and MIPs were obtained to evaluate the vascular anatomy. RADIATION DOSE REDUCTION: This exam was performed according to the departmental dose-optimization program which  includes automated exposure control, adjustment of the mA and/or kV according to patient size and/or use of iterative reconstruction technique. CONTRAST:  75mL OMNIPAQUE  IOHEXOL  350 MG/ML SOLN COMPARISON:  CTA chest dated 05/23/2024 FINDINGS: Cardiovascular: Right chest wall port tip terminates at the superior cavoatrial junction. The study is high quality for the evaluation of pulmonary embolism. There are no filling defects in the central, lobar, segmental or subsegmental pulmonary artery branches to suggest acute pulmonary embolism. Great vessels are normal in course and caliber. Normal heart size. No significant pericardial fluid/thickening. Coronary artery calcifications and aortic atherosclerosis. Mediastinum/Nodes: Imaged thyroid  gland without nodules meeting criteria for imaging follow-up by size. Normal esophagus. Interval decrease in size and partial calcification of left axillary and multi station mediastinal lymphadenopathy, for example: - 11 mm left axillary (5:51), previously 16 mm - 12 mm precarinal (5:57), previously 18 mm Lungs/Pleura: The central airways are patent. Moderate right-greater-than-left diffuse bronchial wall thickening. Again seen are innumerable bilateral pulmonary nodules and lobular right pleural thickening, the majority of which have decreased in size from 05/23/2024. Several also demonstrates internal calcification. For example, -dominant superior segment right lower lobe mass measuring 4.3 x 1.9 cm (11:50), previously 6.8 x 3.8 cm -2.8 x 2.5 cm anterior basilar left lower lobe nodule abutting the major fissure (11:96), previously 3.4 x 3.0 cm -2.7 x 2.5 cm basilar right middle lobe (11:85), previously 4.1 x 3.5 cm No pneumothorax. Unchanged trace right pleural effusion. Upper abdomen: Cholecystectomy. Atrophic kidneys. Partially imaged hyperdense cystic focus in the posterior upper pole right kidney (5:135) may represent a hemorrhagic/proteinaceous cyst. Musculoskeletal: No acute  or abnormal lytic or blastic osseous lesions. Postsurgical changes of right mastectomy. Right axillary surgical clips. Slightly decreased cutaneous thickening of the left breast/anterior chest wall. Review of the MIP images confirms the above findings. IMPRESSION: 1. No evidence of acute pulmonary embolism. 2. Moderate right-greater-than-left diffuse bronchial wall thickening, which can be seen in the setting of bronchitis. 3. Interval decrease in size of innumerable bilateral pulmonary nodules, lobular right pleural thickening, and left axillary and mediastinal lymphadenopathy, consistent with treatment response. 4. Slightly decreased cutaneous thickening of the left breast/anterior chest wall. 5.  Aortic Atherosclerosis (ICD10-I70.0). Electronically Signed   By: Limin  Xu M.D.   On: 07/18/2024 14:47   DG Chest Portable 1 View Result Date: 07/18/2024 EXAM: 1 VIEW(S) XRAY OF THE CHEST 07/18/2024 10:19:00 AM COMPARISON: Comparison 19 days ago. CLINICAL HISTORY: cough, hx of breast CA with lung mets FINDINGS: LINES, TUBES AND DEVICES: Right subclavian Port-A-Cath is unchanged. LUNGS AND PLEURA: Continue presence of innumerable bilateral pulmonary nodules consistent with metastatic disease. No pleural effusion. No pneumothorax. HEART AND MEDIASTINUM: No acute abnormality of the cardiac and mediastinal silhouettes. BONES AND SOFT TISSUES: No acute osseous abnormality. IMPRESSION: 1. Innumerable bilateral pulmonary nodules consistent with metastatic disease, unchanged. 2. Right subclavian port-a-cath is unchanged. Electronically signed by: Lynwood Seip MD 07/18/2024 10:52 AM EST RP Workstation: [id number]   DG Chest 2 View Result Date: 06/29/2024 EXAM: 2 VIEW(S) XRAY OF THE CHEST 06/29/2024 04:38:00 PM COMPARISON: 06/08/2024 CLINICAL HISTORY: SOB FINDINGS: LINES, TUBES AND DEVICES: Right-sided port-a-cath tip overlying the mid SVC. LUNGS AND PLEURA: Innumerable diffuse bilateral lung  nodules of varying sizes  consistent with metastatic disease. No pleural effusion. No pneumothorax. HEART AND MEDIASTINUM: No acute abnormality of the cardiac and mediastinal silhouettes. BONES AND SOFT TISSUES: No acute osseous abnormality. IMPRESSION: 1. Innumerable diffuse bilateral lung nodules of varying sizes consistent with metastatic disease. Electronically signed by: Fonda Field MD 06/29/2024 04:55 PM EST RP Workstation: [id number]   ECHOCARDIOGRAM COMPLETE Result Date: 06/24/2024    ECHOCARDIOGRAM REPORT   Patient Name:   Megan [name] Date of Exam: 06/24/2024 Medical Rec #:  [id number]           Height:       62.0 in Accession #:    [id number]          Weight:       165.0 lb Date of Birth:  [birth year]           BSA:          1.762 m Patient Age:    63 years            BP:           111/67 mmHg Patient Gender: F                   HR:           86 bpm. Exam Location:  [facility] Procedure: 2D Echo, Cardiac Doppler and Color Doppler (Both Spectral and Color            Flow Doppler were utilized during procedure). Indications:    Chemo Z09  History:        Patient has prior history of Echocardiogram examinations, most                 recent 01/04/2021. CAD; Risk Factors:Hypertension, Diabetes and                 Dyslipidemia. Hx of Malignant neoplasm of upper-inner quadrant                 of right breast in female, estrogen receptor positive, GERD,                 Left Mastectomy, ESRD (end stage renal disease) on dialysis,                 History of modified radical mastectomy of right breast.  Sonographer:    Aida Pizza RCS Referring Phys: [id number] HYNDAVI KANDALA IMPRESSIONS  1. Left ventricular ejection fraction, by estimation, is 55 to 60%. The left ventricle has normal function. Left ventricular endocardial border not optimally defined to evaluate regional wall motion. Left ventricular diastolic parameters are consistent with Grade I diastolic dysfunction (impaired relaxation).  2. Right ventricular systolic  function is normal. The right ventricular size is normal. Tricuspid regurgitation signal is inadequate for assessing PA pressure.  3. The mitral valve is normal in structure. Trivial mitral valve regurgitation. No evidence of mitral stenosis.  4. The aortic valve is tricuspid. Aortic valve regurgitation is not visualized. No aortic stenosis is present.  5. The inferior vena cava is normal in size with greater than 50% respiratory variability, suggesting right atrial pressure of 3 mmHg. Comparison(s): No significant change from prior study. FINDINGS  Left Ventricle: Left ventricular ejection fraction, by estimation, is 55 to 60%. The left ventricle has normal function. Left ventricular endocardial border not optimally defined to evaluate regional wall motion. Strain was performed and the global longitudinal strain is indeterminate. The left ventricular internal cavity size was  normal in size. There is no left ventricular hypertrophy. Left ventricular diastolic parameters are consistent with Grade I diastolic dysfunction (impaired relaxation). Normal left ventricular filling pressure. Right Ventricle: The right ventricular size is normal. No increase in right ventricular wall thickness. Right ventricular systolic function is normal. Tricuspid regurgitation signal is inadequate for assessing PA pressure. Left Atrium: Left atrial size was normal in size. Right Atrium: Right atrial size was normal in size. Pericardium: There is no evidence of pericardial effusion. Mitral Valve: The mitral valve is normal in structure. Trivial mitral valve regurgitation. No evidence of mitral valve stenosis. Tricuspid Valve: The tricuspid valve is normal in structure. Tricuspid valve regurgitation is not demonstrated. No evidence of tricuspid stenosis. Aortic Valve: The aortic valve is tricuspid. Aortic valve regurgitation is not visualized. No aortic stenosis is present. Pulmonic Valve: The pulmonic valve was normal in structure.  Pulmonic valve regurgitation is mild. No evidence of pulmonic stenosis. Aorta: The aortic root is normal in size and structure. Venous: The inferior vena cava is normal in size with greater than 50% respiratory variability, suggesting right atrial pressure of 3 mmHg. IAS/Shunts: No atrial level shunt detected by color flow Doppler. Additional Comments: 3D was performed not requiring image post processing on an independent workstation and was indeterminate.  LEFT VENTRICLE PLAX 2D LVIDd:         4.00 cm     Diastology LVIDs:         2.80 cm     LV e' medial:    6.34 cm/s LV PW:         0.90 cm     LV E/e' medial:  13.3 LV IVS:        1.00 cm     LV e' lateral:   11.20 cm/s LVOT diam:     1.80 cm     LV E/e' lateral: 7.5 LV SV:         59 LV SV Index:   34 LVOT Area:     2.54 cm  LV Volumes (MOD) LV vol d, MOD A2C: 81.9 ml LV vol d, MOD A4C: 91.7 ml LV vol s, MOD A2C: 31.1 ml LV vol s, MOD A4C: 34.2 ml LV SV MOD A2C:     50.8 ml LV SV MOD A4C:     91.7 ml LV SV MOD BP:      56.6 ml RIGHT VENTRICLE RV S prime:     13.10 cm/s TAPSE (M-mode): 1.7 cm LEFT ATRIUM             Index        RIGHT ATRIUM           Index LA diam:        3.80 cm 2.16 cm/m   RA Area:     14.90 cm LA Vol (A2C):   39.2 ml 22.25 ml/m  RA Volume:   37.40 ml  21.23 ml/m LA Vol (A4C):   46.2 ml 26.23 ml/m LA Biplane Vol: 42.7 ml 24.24 ml/m  AORTIC VALVE LVOT Vmax:   124.00 cm/s LVOT Vmean:  80.400 cm/s LVOT VTI:    0.232 m  AORTA Ao Root diam: 3.10 cm MITRAL VALVE MV Area (PHT): 4.49 cm     SHUNTS MV Decel Time: 169 msec     Systemic VTI:  0.23 m MV E velocity: 84.10 cm/s   Systemic Diam: 1.80 cm MV A velocity: 115.00 cm/s MV E/A ratio:  0.73 Vishnu Priya Mallipeddi Electronically signed by [employer]  Priya Mallipeddi Signature Date/Time: 06/24/2024/12:49:56 PM    Final     Microbiology: Results for orders placed or performed during the hospital encounter of 07/20/24  Blood Culture (routine x 2)     Status: None (Preliminary result)    Collection Time: 07/20/24  2:20 PM   Specimen: BLOOD  Result Value Ref Range Status   Specimen Description BLOOD POWER PORT  Final   Special Requests NONE  Final   Culture   Final    NO GROWTH 3 DAYS Performed at [facility], [address] [place], [place] [address]    Report Status PENDING  Incomplete  Blood Culture (routine x 2)     Status: None (Preliminary result)   Collection Time: 07/20/24  2:21 PM   Specimen: BLOOD  Result Value Ref Range Status   Specimen Description BLOOD BLOOD RIGHT ARM  Final   Special Requests NONE  Final   Culture   Final    NO GROWTH 3 DAYS Performed at [facility], [address], [place], [place] [address]    Report Status PENDING  Incomplete  Resp panel by RT-PCR (RSV, Flu A&B, Covid) Anterior Nasal Swab     Status: None   Collection Time: 07/20/24  2:30 PM   Specimen: Anterior Nasal Swab  Result Value Ref Range Status   SARS Coronavirus 2 by RT PCR NEGATIVE NEGATIVE Final    Comment: (NOTE) SARS-CoV-2 target nucleic acids are NOT DETECTED.  The SARS-CoV-2 RNA is generally detectable in upper respiratory specimens during the acute phase of infection. The lowest concentration of SARS-CoV-2 viral copies this assay can detect is 138 copies/mL. A negative result does not preclude SARS-Cov-2 infection and should not be used as the sole basis for treatment or other patient management decisions. A negative result may occur with  improper specimen collection/handling, submission of specimen other than nasopharyngeal swab, presence of viral mutation(s) within the areas targeted by this assay, and inadequate number of viral copies(<138 copies/mL). A negative result must be combined with clinical observations, patient history, and epidemiological information. The expected result is Negative.  Fact Sheet for Patients:  bloggercourse.com  Fact Sheet for Healthcare Providers:  seriousbroker.it  This  test is no t yet approved or cleared by the [place]  FDA and  has been authorized for detection and/or diagnosis of SARS-CoV-2 by FDA under an Emergency Use Authorization (EUA). This EUA will remain  in effect (meaning this test can be used) for the duration of the COVID-19 declaration under Section 564(b)(1) of the Act, 21 U.S.C.section 360bbb-3(b)(1), unless the authorization is terminated  or revoked sooner.       Influenza A by PCR NEGATIVE NEGATIVE Final   Influenza B by PCR NEGATIVE NEGATIVE Final    Comment: (NOTE) The Xpert Xpress SARS-CoV-2/FLU/RSV plus assay is intended as an aid in the diagnosis of influenza from Nasopharyngeal swab specimens and should not be used as a sole basis for treatment. Nasal washings and aspirates are unacceptable for Xpert Xpress SARS-CoV-2/FLU/RSV testing.  Fact Sheet for Patients: bloggercourse.com  Fact Sheet for Healthcare Providers: seriousbroker.it  This test is not yet approved or cleared by the [place]  FDA and has been authorized for detection and/or diagnosis of SARS-CoV-2 by FDA under an Emergency Use Authorization (EUA). This EUA will remain in effect (meaning this test can be used) for the duration of the COVID-19 declaration under Section 564(b)(1) of the Act, 21 U.S.C. section 360bbb-3(b)(1), unless the authorization is terminated or revoked.  Resp Syncytial Virus by PCR NEGATIVE NEGATIVE Final    Comment: (NOTE) Fact Sheet for Patients: bloggercourse.com  Fact Sheet for Healthcare Providers: seriousbroker.it  This test is not yet approved or cleared by the [place]  FDA and has been authorized for detection and/or diagnosis of SARS-CoV-2 by FDA under an Emergency Use Authorization (EUA). This EUA will remain in effect (meaning this test can be used) for the duration of the COVID-19 declaration under  Section 564(b)(1) of the Act, 21 U.S.C. section 360bbb-3(b)(1), unless the authorization is terminated or revoked.  Performed at [facility], [address], [place], [place] [address]   MRSA Next Gen by PCR, Nasal     Status: None   Collection Time: 07/20/24  5:35 PM   Specimen: Nasal Mucosa; Nasal Swab  Result Value Ref Range Status   MRSA by PCR Next Gen NOT DETECTED NOT DETECTED Final    Comment: (NOTE) The GeneXpert MRSA Assay (FDA approved for NASAL specimens only), is one component of a comprehensive MRSA colonization surveillance program. It is not intended to diagnose MRSA infection nor to guide or monitor treatment for MRSA infections. Test performance is not FDA approved in patients less than 70 years old. Performed at [facility], [address], [place], [place] [address]    *Note: Due to a large number of results and/or encounters for the requested time period, some results have not been displayed. A complete set of results can be found in Results Review.    Labs: CBC: Recent Labs  Lab 07/18/24 1128 07/20/24 1420 07/21/24 0259 07/22/24 0629 07/23/24 0653  WBC 7.8 8.5 7.0  --  8.4  NEUTROABS 5.9 6.8  --   --   --   HGB 7.4* 7.1* 6.7* 8.1* 8.6*  HCT 24.6* 23.6* 21.9* 26.2* 28.9*  MCV 114.4* 115.7* 115.9*  --  109.5*  PLT 243 255 250  --  307   Basic Metabolic Panel: Recent Labs  Lab 07/18/24 1128 07/20/24 1420 07/21/24 0259 07/23/24 0653  NA 140 139 137 140  K 3.2* 3.0* 3.9 3.6  CL 95* 95* 94* 99  CO2 31 25 30 23   GLUCOSE 95 110* 168* 100*  BUN 9 25* 31* 35*  CREATININE 2.89* 7.40* 7.91* 6.49*  CALCIUM  8.4* 7.9* 8.0* 8.5*  MG 2.0  --   --   --   PHOS  --   --   --  4.1   Liver Function Tests: Recent Labs  Lab 07/20/24 1420 07/21/24 0259 07/23/24 0653  AST 17 14*  --   ALT 26 23  --   ALKPHOS 180* 151*  --   BILITOT 0.3 0.2  --   PROT 6.1* 5.7*  --   ALBUMIN  3.2* 3.1* 3.4*   CBG: Recent Labs  Lab 07/22/24 1109 07/22/24 1623  07/22/24 2117 07/23/24 0739 07/23/24 1131  GLUCAP 134* 116* 151* 95 98    Discharge time spent: greater than 30 minutes.  Signed: Alm Schneider, MD [facility] 07/23/2024

## 2024-07-24 ENCOUNTER — Telehealth: Payer: Self-pay | Admitting: *Deleted

## 2024-07-24 NOTE — Transitions of Care (Post Inpatient/ED Visit) (Signed)
° °  07/24/2024  Name: Megan [name] MRN: [id number] DOB: [birth year]  Today's TOC FU Call Status: Today's TOC FU Call Status:: Unsuccessful Call (1st Attempt) Unsuccessful Call (1st Attempt) Date: 07/24/24  Attempted to reach the patient regarding the most recent Inpatient/ED visit.  Follow Up Plan: Additional outreach attempts will be made to reach the patient to complete the Transitions of Care (Post Inpatient/ED visit) call.   Mliss Creed [facility], BSN RN Care Manager/ Transition of Care [place]/ [facility] [phone]

## 2024-07-25 ENCOUNTER — Telehealth: Payer: Self-pay

## 2024-07-25 LAB — CULTURE, BLOOD (ROUTINE X 2)
Culture: NO GROWTH
Culture: NO GROWTH

## 2024-07-25 NOTE — Transitions of Care (Post Inpatient/ED Visit) (Signed)
° °  07/25/2024  Name: Megan [name] MRN: [id number] DOB: 1960-10-13  Today's TOC FU Call Status: Today's TOC FU Call Status:: Unsuccessful Call (2nd Attempt) Unsuccessful Call (2nd Attempt) Date: 07/25/24  Attempted to reach the patient regarding the most recent Inpatient/ED visit.  Follow Up Plan: Additional outreach attempts will be made to reach the patient to complete the Transitions of Care (Post Inpatient/ED visit) call.   Khalon Cansler J. Hommer Cunliffe RN, MSN [facility], [facility] RN Care Manager Direct Dial : [phone]  Fax: [phone] Website: [email or web address]

## 2024-07-28 ENCOUNTER — Telehealth: Payer: Self-pay | Admitting: *Deleted

## 2024-07-28 NOTE — Progress Notes (Signed)
 Patient Care Team: Duanne Butler DASEN, MD as PCP - General (Family Medicine) Alvan, Dorn FALCON, MD as PCP - Cardiology (Cardiology) Shaaron Lamar HERO, MD (Gastroenterology) Debera Jayson MATSU, MD as Consulting Physician (Cardiology) Rayburn Pac, MD as Consulting Physician (Nephrology)  Clinic Day:  07/28/2024  Referring physician: Duanne Butler DASEN, MD   CHIEF COMPLAINT:  CC: Stage Ib (T1CN0) right breast cancer, ER positive, PR and HER-2 negative    ASSESSMENT & PLAN:   Assessment & Plan: Megan [name]  is a 63 y.o. 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  Diagnosed in 2019. S/p right breast lumpectomy, Oncotype DX score of 47.  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  Also had prophylactic left simple mastectomy. First recurrence: 07/13/2020.  Local recurrence, s/p right breast lumpectomy. Second occurrence: 10/12/2020.  Local recurrence.  S/p right mastectomy and radiation therapy.Patient decided against chemotherapy considering poor tolerance with the first time and bad peripheral neuropathy. Third recurrence: 11/30/2022: Within the right medial pectoralis musculature-local recurrence.  S/p right chest wall mass resection consistent with invasive ductal carcinoma.  Triple negative. S/p RT.  6 cycles of carboplatin /docetaxel  and pembrolizumab . PET scan in 01/2024 showed abnormal lymph nodes in the left axillary region.  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 Triple negative.  Germline mutation analysis: Negative.  [facility] NGS: PD-L1: 10, no other targetable mutations Started on capecitabine  and pembrolizumab .  Patient completed 1 cycle and was admitted to the hospital for  shortness of breath.  CT scan at this time showed progression of disease with lung metastasis and liver metastasis. 05/29/2024: Started on Trodelvy .   -Patient is tolerating this dose of Trodelvy  well.  She reports occasional diarrhea that is well-controlled with Lomotil . - Patient was admitted to the hospital for bronchitis and the symptoms have improved significantly.  Discussed that interstitial lung disease can be a complication of sacituzumab govitecan  and we will have to keep an eye on it. - Labs reviewed today: CMP: Potassium: 2.7, creatinine: 5.69, alkaline phosphatase: 170, AST: 24, ALT: 53.  CBC: Hemoglobin: 9.6, WBC: 13.4, platelets: 339. -Physical exam stable today.  Proceed with treatment today -Patient had a recent CT angiogram while she was admitted to the hospital that showed improvement in the pulmonary nodules. - Will repeat a PET scan after completion of 3 cycles of chemotherapy prior to next visit.  Ordered today.   Return to clinic prior to next cycle to assess for tolerance and PET scan review.  Diarrhea Patient reports significant improvement in diarrhea with lower dose of Sacituzumab Govitecan .  Reports Lomotil  being effective.  - Use Imodium  and Lomotil  as needed.  Hypokalemia Likely secondary to diarrhea.  EKG shows prolonged QTc but improved from prior.  - Will replace per protocol. - Will recheck labs on Thursday.   Cough due to lung metastases Persistent cough likely from lung metastases. No antibiotics needed at this time.  Patient required admission to the hospital for bronchitis.  Improved at this time.  - Continue Tessalon  perles for cough management. - Provideded refills for Tessalon  perles as needed. - Continue oxygen  support  Peripheral neuropathy Existing peripheral neuropathy may worsen with new treatment.    -Recommended to report any symptom changes. - Monitor for worsening neuropathy  symptoms. - Instructed her to report any increase in  numbness or tingling.  Anemia Anemia with low hemoglobin.  Likely multifactorial from ESRD,myelosuppression.  Anemia panel with elevated ferritin last time  - Monitor hemoglobin levels closely.  End-stage renal disease on dialysis End-stage renal disease managed with dialysis.   - Continue regular dialysis sessions. - Monitor kidney function closely.  Weight loss Patient has lost about 20 pounds.  Appetite is stable at this time.  -Educated patient that protein supplementation should not replace meals and patient should be eating meals and use protein supplementation for additional requirement - Will refer to nutrition    The patient understands the plans discussed today and is in agreement with them.  She knows to contact our office if she develops concerns prior to her next appointment.  The total time spent in the appointment was 30 minutes for the encounter  with patient, including review of chart and various tests results, discussions about plan of care and coordination of care plan.   [name],acting as a neurosurgeon for Mickiel Dry, MD.,have documented all relevant documentation on the behalf of Mickiel Dry, MD,as directed by  Mickiel Dry, MD while in the presence of Mickiel Dry, MD.  I, Mickiel Dry MD, have reviewed the above documentation for accuracy and completeness, and I agree with the above.     Verneta SAUNDERS Ege   [facility] [place] - A DEPT OF [name] [address] [place] [address] Dept: [phone] Dept Fax: [phone]   No orders of the defined types were placed in this encounter.    ONCOLOGY HISTORY:   I have reviewed her chart and materials related to her cancer extensively and collaborated history with the patient. Summary of oncologic history is as follows:   Diagnosis: Stage Ib (T1CN0) right breast cancer, ER positive, PR and HER-2 negative   -02/18/2018:  Screening Mammogram: possible mass in the right breast. -02/26/2018: Diagnostic Mammogram and US  of right breast: Several small 4-5 mm cysts, with a bilobed cyst at the 1 o'clock position 6 cm from nipple measuring 0.5 x 0.3 x 0.4 cm. Irregular shadowing mass in the right breast at the 12:30 position 6 cm from nipple measuring 0.7 x 0.8 x 0.6 cm. -03/05/2018: Right breast mass biopsy.  Pathology: Benign breast tissue with areas of dense hyaline fibrosis. No evidence of malignancy.  -08/20/2018: Diagnostic Right breast Mammogram: Suspicious mass in the 1 o'clock location of the RIGHT breast.  -08/27/2018: Right breast mass biopsy.  Pathology: Invasive ductal carcinoma. Based on the biopsy, the carcinoma appears Nottingham grade 3 of 3 and measures 0.5 cm in greatest linear extent. The tumor cells are ER positive (50%), PR negative (0%), and Her2 negative (2+). Ki67 60%. -09/18/2018: Right lumpectomy and sentinel lymph node biopsy.  Pathology: Invasive ductal carcinoma, grade 3, spanning 1.5 cm. High grade ductal carcinoma in situ with necrosis. Invasive carcinoma comes to within 0.1-0.2 cm of the anterior margin focally. Three of three lymph nodes negative for metastatic carcinoma.  Oncotype DX: 47 -10/30/2018 to 01/01/2019: 4 cycles of adjuvant AC -01/2019- 10/2023: Anastrozole  -09/02/2019: Mammogram: 8 mm group of indeterminate calcifications in the 3 o'clock position of the right breast. -09/12/2019: Right breast biopsy.  Pathology: DCIS, high nuclear grade with central necrosis and calcifications.  -02/18/2020: Right partial mastectomy. Pathology: Ductal carcinoma in situ, high-grade, with central necrosis and calcifications. No evidence of invasive carcinoma. Resection margins are negative for DCIS.  Eleven lymph nodes, negative for carcinoma (0/11). -06/15/2020: Prophylactic Left simple mastectomy with right breast bisopy.  Pathology of left breast: Benign breast parenchyma with mild  fibrocystic change.Negative for carcinoma. Pathology of right breast biopsy: Microscopic focus of invasive ductal carcinoma in a background of extensive high-grade ductal carcinoma in situ with necrosis and rare calcifications measuring 0.1 cm in greatest linear dimension.  -07/13/2020: Right breast lumpectomy.  Pathology: Multifocal invasive ductal carcinoma, grade 3, largest spanning 1.2 cm. Extensive high grade ductal carcinoma in situ with necrosis. Invasive carcinoma is 1-2 mm from the medial margin focally and 2 mm from the lateral margin focally.  ER/PR/HER-2 is negative.  Ki-67 is 20%.  -09/14/2020: PET: Areas of nodularity about the RIGHT breast in this patient who is undergone previous lumpectomy x2 and RIGHT mastectomy. Most suspicious area is associated with discrete nodule in the fat of the inferior chest wall within the subcutaneous tissues. Scattered small areas along the pectoralis musculature tracking laterally largest on image 56 of series 4 also associated with low level uptake. Small pulmonary nodules without interval change. -09/28/2020: US  of right chest wall: Mass-like abnormality in the upper inner aspect of the right anterior chest, designated at 1 o'clock, 7 cm from the central aspect of the mastectomy scar. This measures 4.1 cm in greatest dimension. 1.7 cm hypoechoic mass the central anterior right chest deep to the mid aspect of the mastectomy scar. Possible borderline enlarged residual right axillary lymph node.  -10/12/2020: Right breast biopsy.  Pathology: Invasive and in situ ductal carcinoma, 1.3 x 1.1 x 0.8 cm. Invasive carcinoma focally 0.1 cm from superior margin. DCIS less than 0.1 cm from superior margin.  -10/28/2020: [facility] results: Negative. -12/2020: Right mastectomy followed by radiation therapy completed. Patient decided against chemotherapy considering poor tolerance with the first time and bad peripheral neuropathy. -11/30/2022: PET: Recurrent/metastatic  disease within the right medial pectoralis musculature in this patient who is status post bilateral mastectomy. A right suprarenal 8 mm nodule is likely arising from the adrenal gland. -12/19/2022: Right chest wall mass biopsy.  Pathology: Metastatic adenocarcinoma. Tumor cells are ER positive (40%), PR negative (0%), and Her2 negative (0). Ki-67 30%. -01/18/2023: Right chest wall mass resection with right breast biopsy. -Pathology of right chest wall: Invasive ductal carcinoma (clinically recurrent), 1.7 cm, grade 3. Ductal carcinoma in situ, high-grade with necrosis. Carcinoma involves the inked inferior margin. Carcinoma focally invades into atrophic skeletal muscle. -Pathology of right breast biopsy: Grade 3 Invasive ductal carcinoma. Lymphovascular invasion not identified. Tumor cells ER negative (0%), PR negative (0%), and Her negative (0). Ki-67 35%. -04/18/2023: Patient's case discussed at multidisciplinary clinic at [facility]. Surgical oncology recommended radiation therapy. Patient also evaluated by medical oncology who recommended systemic therapy with 6 cycles of docetaxel , carboplatin  and pembrolizumab .  -05/07/2023 to 05/25/2023: XRT -06/14/2023 to 09/27/2023:  6 cycles of carboplatin , docetaxel  and pembrolizumab   -01/31/2024: PET: multiple abnormal lymph nodes identified in the left axillary region and chest wall which are hypermetabolic there is also a node in the supraclavicular region on the left. These are concerning for worsening metastatic disease. -02/26/2024: Left axillary lymph node biopsy.  -Pathology: Metastatic carcinoma involving a lymph node, compatible with patient's clinical history of primary breast carcinoma. All prognostic markers negative (0). Ki-67 20%.  -[facility] NGS: PDL1: Positive, CPS: 10, ER/PR/HER2/NEU: Triple negative, HER2: Negative, MSI-stable, BRAF, NTRK 1/2/3, RET: Negative, genomic LOH: Low, TMB: Low 53mut/Mb, ESR 1: Not detected, PALB2: Not detected  PIK3CA: Not detected -03/03/2024: CT AP  w contrast: New right lower lobe nodules worrisome for metastatic disease. -03/12/2024: BRCA1/2 analysis: Germline mutation analysis: NTHL1- Variant of unknown significance -03/26/2024: Patient was seen by Dr. [name] coons oncologist] at [facility], who recommended systemic treatment with pembrolizumab  and significantly dose-reduced capecitabine  (500 mg BID). If disease progression occurs, next line of treatment may be dose-adjusted sacituzumab govitecan  + pembrolizumab  (Considering poor data for either in patients with ESRD  -04/08/2024: CEA 15-3: 67, CA 27-29: 98.7 -04/17/2024-05/08/2024:  Capecitabine (dose reduced ) + pembrolizumab  -05/22/2024: PET Scan: Marked progression of metastatic breast cancer as evidenced by widespread pulmonary metastatic disease, progressive low neck, chest and upper abdominal adenopathy, new liver metastasis and new intercostal metastasis. - 05/29/2024-current: Sacituzumab govitecan  every 3 weeks. 2nd cycle delayed due to hospitalization and also had severe diarrhea during this period.  -07/01/2024: Dose reduced to 5 mg/kg on day 1 and 8 of a 21-day cycle. -07/20/2024: Chest x-ray: Stable diffuse pulmonary metastases.    Current Treatment:  Sacituzumab govitecan  every 3 weeks.  INTERVAL HISTORY:   Megan [name] is a 63 y.o. female presenting to the clinic today for follow-up of ER positive right breast cancer. She is accompanied by her husband today.   We discussed imaging results as well as her treatment and its side effects including lung inflammation. She has occasional diarrhea since lowering her dosage and is taking Lomotil . She continues to take Tessalon  Perles and OTC cough syrup for her cough. She has bronchitis, though this is improving with subsequent improvement in her cough and oxygen  saturation.   Krisy has unintentionally lost around 20 pounds she attributes to a mildly decreased appetite. This may  also be due to some fluid loss. She is drinking protein drinks, which we discussed being supplements not meal replacements.    I have reviewed the past medical history, past surgical history, social history and family history with the patient and they are unchanged from previous note.  ALLERGIES:  is allergic to motrin [ibuprofen] and trodelvy  [sacituzumab govitecan -hziy].  MEDICATIONS:  Current Outpatient Medications  Medication Sig Dispense Refill   acetaminophen  (TYLENOL ) 500 MG tablet Take 1,000 mg by mouth every 8 (eight) hours as needed for moderate pain (pain score 4-6).     albuterol  (PROVENTIL ) (2.5 MG/3ML) 0.083% nebulizer solution Take 3 mLs (2.5 mg total) by nebulization every 4 (four) hours as needed for wheezing or shortness of breath. 75 mL 2   albuterol  (VENTOLIN  HFA) 108 (90 Base) MCG/ACT inhaler Inhale 2 puffs into the lungs every 6 (six) hours as needed for wheezing or shortness of breath. 8 g 2   aspirin  EC 81 MG tablet Take 81 mg by mouth daily.     azithromycin  (ZITHROMAX ) 500 MG tablet Take 1 tablet (500 mg total) by mouth daily. 3 tablet 0   B Complex-C-Zn-Folic Acid  (DIALYVITE  800-ZINC  15) 0.8 MG TABS Take 1 tablet by mouth daily.     benzonatate  (TESSALON ) 200 MG capsule Take 1 capsule (200 mg total) by mouth 2 (two) times daily. 20 capsule 0   cefdinir  (OMNICEF ) 300 MG capsule Take 1 capsule (300 mg total) by mouth daily. 3 capsule 0   cinacalcet  (SENSIPAR ) 90 MG tablet Take 90 mg by mouth daily.     Darbepoetin Alfa  (ARANESP , ALBUMIN  FREE, IJ) Inject 200 mcg into the vein every Friday.     diphenoxylate -atropine  (LOMOTIL ) 2.5-0.025 MG tablet Take 1 tablet by mouth 4 (four) times daily as needed for diarrhea or loose stools. 120 tablet 0   gabapentin  (  NEURONTIN ) 300 MG capsule TAKE 1 CAPSULE(300 MG) BY MOUTH AT BEDTIME 90 capsule 0   lanthanum  (FOSRENOL ) 1000 MG chewable tablet Chew 2,000-3,000 mg by mouth See admin instructions. Take 3 tablets (3000 mg) by mouth with  meals and take 2 tablets (2000 mg) with snacks     lidocaine -prilocaine  (EMLA ) cream Apply to affected area once (Patient taking differently: Apply 1 Application topically every Monday, Wednesday, and Friday with hemodialysis. Apply to affected area once) 30 g 3   midodrine  (PROAMATINE ) 2.5 MG tablet Take 2.5 mg by mouth See admin instructions. Monday,Wednesday and Friday Only take on dialysis days     Nebulizers (COMPRESSOR/NEBULIZER) MISC 1 Units by Does not apply route daily as needed. 1 each 0   nystatin  (MYCOSTATIN ) 100000 UNIT/ML suspension Take 5 mLs (500,000 Units total) by mouth 4 (four) times daily. Swish and swallow 473 mL 1   ondansetron  (ZOFRAN ) 8 MG tablet Take 1 tablet (8 mg total) by mouth every 8 (eight) hours as needed for nausea or vomiting. Start on the third day after chemotherapy. 30 tablet 1   pantoprazole  (PROTONIX ) 40 MG tablet Take 1 tablet (40 mg total) by mouth 2 (two) times daily before a meal. 60 tablet 11   predniSONE  (DELTASONE ) 10 MG tablet Take 5 tablets (50 mg total) by mouth daily with breakfast. And decrease by one tablet daily 15 tablet 0   No current facility-administered medications for this visit.    VITALS:  There were no vitals taken for this visit.  Wt Readings from Last 3 Encounters:  07/23/24 166 lb 7.2 oz (75.5 kg)  07/01/24 161 lb 6 oz (73.2 kg)  06/29/24 173 lb (78.5 kg)    There is no height or weight on file to calculate BMI.  Performance status (ECOG): 0 - Asymptomatic  PHYSICAL EXAM:   GENERAL:alert, no distress and comfortable SKIN: skin color, texture, turgor are normal, no rashes or significant lesions HEENT: Oral thrush present on her posterior side of the cheeks EYES: normal, Conjunctiva are pink and non-injected, sclera clear LUNGS: clear to auscultation and percussion with normal breathing effort on oxygen  via nasal cannula HEART: regular rate & rhythm and no murmurs and no lower extremity edema ABDOMEN:abdomen soft,  non-tender and normal bowel sounds Musculoskeletal:no cyanosis of digits and no clubbing  NEURO: alert & oriented x 3 with fluent speech   LABORATORY DATA:  I have reviewed the data as listed  Lab Results  Component Value Date   WBC 13.4 (H) 07/29/2024   HGB 9.6 (L) 07/29/2024   HCT 31.6 (L) 07/29/2024   MCV 112.9 (H) 07/29/2024   PLT 339 07/29/2024      Chemistry      Component Value Date/Time   NA 142 07/29/2024 0928   K 2.7 (LL) 07/29/2024 0928   CL 98 07/29/2024 0928   CO2 27 07/29/2024 0928   BUN 30 (H) 07/29/2024 0928   CREATININE 5.69 (H) 07/29/2024 0928   CREATININE 9.08 (H) 01/10/2024 1113      Component Value Date/Time   CALCIUM  9.0 07/29/2024 0928   ALKPHOS 170 (H) 07/29/2024 0928   AST 24 07/29/2024 0928   ALT 53 (H) 07/29/2024 0928   BILITOT 0.3 07/29/2024 0928       RADIOGRAPHIC STUDIES: I have personally reviewed the radiological images as listed and agreed with the findings in the report.  DG Chest Port 1 View CLINICAL DATA:  Cough and shortness of breath, history of metastatic breast cancer  EXAM: PORTABLE  CHEST 1 VIEW  COMPARISON:  07/18/2024  FINDINGS: Single frontal view of the chest demonstrates stable right chest wall port. Cardiac silhouette is unremarkable. Innumerable bilateral pulmonary metastases are again noted, without significant change since the previous exam. No new airspace disease, effusion, or pneumothorax. No acute displaced fracture.  IMPRESSION: 1. Stable diffuse pulmonary metastases. 2. No acute airspace disease.  Electronically Signed   By: Ozell Daring M.D.   On: 07/20/2024 14:37   CLINICAL DATA:  Metastatic breast cancer with lung and liver metastases. Several week history of cough and shortness of breath   EXAM: CT ANGIOGRAPHY CHEST WITH CONTRAST   TECHNIQUE: Multidetector CT imaging of the chest was performed using the standard protocol during bolus administration of intravenous contrast. Multiplanar  CT image reconstructions and MIPs were obtained to evaluate the vascular anatomy.   RADIATION DOSE REDUCTION: This exam was performed according to the departmental dose-optimization program which includes automated exposure control, adjustment of the mA and/or kV according to patient size and/or use of iterative reconstruction technique.   CONTRAST:  75mL OMNIPAQUE  IOHEXOL  350 MG/ML SOLN   COMPARISON:  CTA chest dated 05/23/2024   FINDINGS: Cardiovascular: Right chest wall port tip terminates at the superior cavoatrial junction. The study is high quality for the evaluation of pulmonary embolism. There are no filling defects in the central, lobar, segmental or subsegmental pulmonary artery branches to suggest acute pulmonary embolism. Great vessels are normal in course and caliber. Normal heart size. No significant pericardial fluid/thickening. Coronary artery calcifications and aortic atherosclerosis.   Mediastinum/Nodes: Imaged thyroid  gland without nodules meeting criteria for imaging follow-up by size. Normal esophagus. Interval decrease in size and partial calcification of left axillary and multi station mediastinal lymphadenopathy, for example:   - 11 mm left axillary (5:51), previously 16 mm   - 12 mm precarinal (5:57), previously 18 mm   Lungs/Pleura: The central airways are patent. Moderate right-greater-than-left diffuse bronchial wall thickening. Again seen are innumerable bilateral pulmonary nodules and lobular right pleural thickening, the majority of which have decreased in size from 05/23/2024. Several also demonstrates internal calcification. For example,   -dominant superior segment right lower lobe mass measuring 4.3 x 1.9 cm (11:50), previously 6.8 x 3.8 cm   -2.8 x 2.5 cm anterior basilar left lower lobe nodule abutting the major fissure (11:96), previously 3.4 x 3.0 cm   -2.7 x 2.5 cm basilar right middle lobe (11:85), previously 4.1 x 3.5 cm   No  pneumothorax. Unchanged trace right pleural effusion.   Upper abdomen: Cholecystectomy. Atrophic kidneys. Partially imaged hyperdense cystic focus in the posterior upper pole right kidney (5:135) may represent a hemorrhagic/proteinaceous cyst.   Musculoskeletal: No acute or abnormal lytic or blastic osseous lesions. Postsurgical changes of right mastectomy. Right axillary surgical clips. Slightly decreased cutaneous thickening of the left breast/anterior chest wall.   Review of the MIP images confirms the above findings.   IMPRESSION: 1. No evidence of acute pulmonary embolism. 2. Moderate right-greater-than-left diffuse bronchial wall thickening, which can be seen in the setting of bronchitis. 3. Interval decrease in size of innumerable bilateral pulmonary nodules, lobular right pleural thickening, and left axillary and mediastinal lymphadenopathy, consistent with treatment response. 4. Slightly decreased cutaneous thickening of the left breast/anterior chest wall. 5.  Aortic Atherosclerosis (ICD10-I70.0).     Electronically Signed   By: Limin  Xu M.D.   On: 07/18/2024 14:47

## 2024-07-28 NOTE — Transitions of Care (Post Inpatient/ED Visit) (Signed)
° °  07/28/2024  Name: Megan [name] MRN: [id number] DOB: May 11, 1961  Today's TOC FU Call Status: Today's TOC FU Call Status:: Successful TOC FU Call Completed TOC FU Call Complete Date: 07/28/24  Patient's Name and Date of Birth confirmed. Name, DOB  Transition Care Management Follow-up Telephone Call Date of Discharge: 07/23/24 [facility] Type of Discharge: Inpatient Admission Primary Inpatient Discharge Diagnosis:: COPD with acute exacerbation How have you been since you were released from the hospital?: Better (pt states  I'm doing much better, I have everything I need) Any questions or concerns?: No  Items Reviewed: Did you receive and understand the discharge instructions provided?: Yes Medications obtained,verified, and reconciled?: No Medications Not Reviewed Reasons:: Other: (pt declined) Any new allergies since your discharge?: No Dietary orders reviewed?: Yes Type of Diet Ordered:: renal,  carbohydrate modified Do you have support at home?: Yes People in Home [RPT]: spouse Name of Support/Comfort Primary Source: spouse  Medications Reviewed Today:  Unable to review medications, pt did not want to talk further Medications Reviewed Today   Medications were not reviewed in this encounter     Home Care and Equipment/Supplies: Were Home Health Services Ordered?: Yes Name of Home Health Agency:: [facility] Has Agency set up a time to come to your home?:  (unable to assess, pt ended the call) Any new equipment or medical supplies ordered?: No  Functional Questionnaire: Do you need assistance with bathing/showering or dressing?:  (unable to complete functional status/ ADL's, pt declined)  Follow up appointments reviewed: PCP Follow-up appointment confirmed?: No (pt declined for RN CM to schedule post hospital follow up) MD Provider Line Number:[phone] Given: No Specialist Hospital Follow-up appointment confirmed?: NA (did not  discuss, pt declined) Do you need transportation to your follow-up appointment?: No Do you understand care options if your condition(s) worsen?: Yes-patient verbalized understanding  Unable to complete SDOH screenings Unable to complete medication review, pt did not want to continue the call  Mliss Creed [facility], BSN RN Care Manager/ Transition of Care [place] [phone]

## 2024-07-29 ENCOUNTER — Inpatient Hospital Stay

## 2024-07-29 ENCOUNTER — Inpatient Hospital Stay: Attending: Hematology | Admitting: Oncology

## 2024-07-29 VITALS — BP 132/85 | HR 100 | Temp 97.6°F | Resp 20 | Wt 158.0 lb

## 2024-07-29 DIAGNOSIS — E876 Hypokalemia: Secondary | ICD-10-CM | POA: Diagnosis not present

## 2024-07-29 DIAGNOSIS — Z1732 Human epidermal growth factor receptor 2 negative status: Secondary | ICD-10-CM | POA: Diagnosis not present

## 2024-07-29 DIAGNOSIS — Z7952 Long term (current) use of systemic steroids: Secondary | ICD-10-CM | POA: Insufficient documentation

## 2024-07-29 DIAGNOSIS — C50911 Malignant neoplasm of unspecified site of right female breast: Secondary | ICD-10-CM | POA: Insufficient documentation

## 2024-07-29 DIAGNOSIS — C7800 Secondary malignant neoplasm of unspecified lung: Secondary | ICD-10-CM | POA: Diagnosis not present

## 2024-07-29 DIAGNOSIS — C787 Secondary malignant neoplasm of liver and intrahepatic bile duct: Secondary | ICD-10-CM | POA: Insufficient documentation

## 2024-07-29 DIAGNOSIS — N186 End stage renal disease: Secondary | ICD-10-CM

## 2024-07-29 DIAGNOSIS — R053 Chronic cough: Secondary | ICD-10-CM

## 2024-07-29 DIAGNOSIS — Z79899 Other long term (current) drug therapy: Secondary | ICD-10-CM | POA: Insufficient documentation

## 2024-07-29 DIAGNOSIS — R197 Diarrhea, unspecified: Secondary | ICD-10-CM | POA: Insufficient documentation

## 2024-07-29 DIAGNOSIS — R0602 Shortness of breath: Secondary | ICD-10-CM | POA: Insufficient documentation

## 2024-07-29 DIAGNOSIS — C799 Secondary malignant neoplasm of unspecified site: Secondary | ICD-10-CM

## 2024-07-29 DIAGNOSIS — Z1722 Progesterone receptor negative status: Secondary | ICD-10-CM | POA: Insufficient documentation

## 2024-07-29 DIAGNOSIS — G629 Polyneuropathy, unspecified: Secondary | ICD-10-CM | POA: Insufficient documentation

## 2024-07-29 DIAGNOSIS — Z9221 Personal history of antineoplastic chemotherapy: Secondary | ICD-10-CM | POA: Diagnosis not present

## 2024-07-29 DIAGNOSIS — Z170 Estrogen receptor positive status [ER+]: Secondary | ICD-10-CM | POA: Insufficient documentation

## 2024-07-29 DIAGNOSIS — R9431 Abnormal electrocardiogram [ECG] [EKG]: Secondary | ICD-10-CM | POA: Insufficient documentation

## 2024-07-29 DIAGNOSIS — G6289 Other specified polyneuropathies: Secondary | ICD-10-CM

## 2024-07-29 DIAGNOSIS — Z923 Personal history of irradiation: Secondary | ICD-10-CM | POA: Insufficient documentation

## 2024-07-29 DIAGNOSIS — Z7982 Long term (current) use of aspirin: Secondary | ICD-10-CM | POA: Insufficient documentation

## 2024-07-29 DIAGNOSIS — D649 Anemia, unspecified: Secondary | ICD-10-CM

## 2024-07-29 DIAGNOSIS — I700 Atherosclerosis of aorta: Secondary | ICD-10-CM | POA: Diagnosis not present

## 2024-07-29 DIAGNOSIS — Z992 Dependence on renal dialysis: Secondary | ICD-10-CM | POA: Diagnosis not present

## 2024-07-29 DIAGNOSIS — J40 Bronchitis, not specified as acute or chronic: Secondary | ICD-10-CM | POA: Insufficient documentation

## 2024-07-29 DIAGNOSIS — Z1501 Genetic susceptibility to malignant neoplasm of breast: Secondary | ICD-10-CM | POA: Diagnosis not present

## 2024-07-29 DIAGNOSIS — Z5112 Encounter for antineoplastic immunotherapy: Secondary | ICD-10-CM | POA: Insufficient documentation

## 2024-07-29 DIAGNOSIS — R634 Abnormal weight loss: Secondary | ICD-10-CM | POA: Diagnosis not present

## 2024-07-29 LAB — CBC WITH DIFFERENTIAL/PLATELET
Abs Immature Granulocytes: 0.28 K/uL — ABNORMAL HIGH (ref 0.00–0.07)
Basophils Absolute: 0.1 K/uL (ref 0.0–0.1)
Basophils Relative: 0 %
Eosinophils Absolute: 0.2 K/uL (ref 0.0–0.5)
Eosinophils Relative: 1 %
HCT: 31.6 % — ABNORMAL LOW (ref 36.0–46.0)
Hemoglobin: 9.6 g/dL — ABNORMAL LOW (ref 12.0–15.0)
Immature Granulocytes: 2 %
Lymphocytes Relative: 9 %
Lymphs Abs: 1.2 K/uL (ref 0.7–4.0)
MCH: 34.3 pg — ABNORMAL HIGH (ref 26.0–34.0)
MCHC: 30.4 g/dL (ref 30.0–36.0)
MCV: 112.9 fL — ABNORMAL HIGH (ref 80.0–100.0)
Monocytes Absolute: 1.5 K/uL — ABNORMAL HIGH (ref 0.1–1.0)
Monocytes Relative: 11 %
Neutro Abs: 10.3 K/uL — ABNORMAL HIGH (ref 1.7–7.7)
Neutrophils Relative %: 77 %
Platelets: 339 K/uL (ref 150–400)
RBC: 2.8 MIL/uL — ABNORMAL LOW (ref 3.87–5.11)
RDW: 24.4 % — ABNORMAL HIGH (ref 11.5–15.5)
WBC: 13.4 K/uL — ABNORMAL HIGH (ref 4.0–10.5)
nRBC: 0.7 % — ABNORMAL HIGH (ref 0.0–0.2)

## 2024-07-29 LAB — COMPREHENSIVE METABOLIC PANEL WITH GFR
ALT: 53 U/L — ABNORMAL HIGH (ref 0–44)
AST: 24 U/L (ref 15–41)
Albumin: 3.6 g/dL (ref 3.5–5.0)
Alkaline Phosphatase: 170 U/L — ABNORMAL HIGH (ref 38–126)
Anion gap: 17 — ABNORMAL HIGH (ref 5–15)
BUN: 30 mg/dL — ABNORMAL HIGH (ref 8–23)
CO2: 27 mmol/L (ref 22–32)
Calcium: 9 mg/dL (ref 8.9–10.3)
Chloride: 98 mmol/L (ref 98–111)
Creatinine, Ser: 5.69 mg/dL — ABNORMAL HIGH (ref 0.44–1.00)
GFR, Estimated: 8 mL/min — ABNORMAL LOW (ref >60)
Glucose, Bld: 118 mg/dL — ABNORMAL HIGH (ref 70–99)
Potassium: 2.7 mmol/L — CL (ref 3.5–5.1)
Sodium: 142 mmol/L (ref 135–145)
Total Bilirubin: 0.3 mg/dL (ref 0.0–1.2)
Total Protein: 6.1 g/dL — ABNORMAL LOW (ref 6.5–8.1)

## 2024-07-29 LAB — PHOSPHORUS: Phosphorus: 3.3 mg/dL (ref 2.5–4.6)

## 2024-07-29 LAB — SAMPLE TO BLOOD BANK

## 2024-07-29 LAB — MAGNESIUM: Magnesium: 2 mg/dL (ref 1.7–2.4)

## 2024-07-29 MED ORDER — SODIUM CHLORIDE 0.9 % IV SOLN
5.0000 mg/kg | Freq: Once | INTRAVENOUS | Status: AC
  Administered 2024-07-29: 13:00:00 360 mg via INTRAVENOUS
  Filled 2024-07-29: qty 36

## 2024-07-29 MED ORDER — POTASSIUM CHLORIDE CRYS ER 20 MEQ PO TBCR
40.0000 meq | EXTENDED_RELEASE_TABLET | Freq: Once | ORAL | Status: AC
  Administered 2024-07-29: 11:00:00 40 meq via ORAL
  Filled 2024-07-29: qty 2

## 2024-07-29 MED ORDER — DEXAMETHASONE SOD PHOSPHATE PF 10 MG/ML IJ SOLN
10.0000 mg | Freq: Once | INTRAMUSCULAR | Status: AC
  Administered 2024-07-29: 11:00:00 10 mg via INTRAVENOUS

## 2024-07-29 MED ORDER — GUAIFENESIN DM 10-100 MG/5ML PO LIQD: 10.0000 mL | Freq: Once | ORAL | Status: DC

## 2024-07-29 MED ORDER — POTASSIUM CHLORIDE 10 MEQ/100ML IV SOLN
10.0000 meq | Freq: Once | INTRAVENOUS | Status: AC
  Administered 2024-07-29: 15:00:00 10 meq via INTRAVENOUS

## 2024-07-29 MED ORDER — PALONOSETRON HCL INJECTION 0.25 MG/5ML
0.2500 mg | Freq: Once | INTRAVENOUS | Status: AC
  Administered 2024-07-29: 11:00:00 0.25 mg via INTRAVENOUS
  Filled 2024-07-29: qty 5

## 2024-07-29 MED ORDER — ACETAMINOPHEN 325 MG PO TABS
650.0000 mg | ORAL_TABLET | Freq: Once | ORAL | Status: AC
  Administered 2024-07-29: 11:00:00 650 mg via ORAL
  Filled 2024-07-29: qty 2

## 2024-07-29 MED ORDER — POTASSIUM CHLORIDE CRYS ER 20 MEQ PO TBCR
40.0000 meq | EXTENDED_RELEASE_TABLET | Freq: Once | ORAL | Status: AC
  Administered 2024-07-29: 15:00:00 40 meq via ORAL
  Filled 2024-07-29: qty 2

## 2024-07-29 MED ORDER — APREPITANT 130 MG/18ML IV EMUL
130.0000 mg | Freq: Once | INTRAVENOUS | Status: AC
  Administered 2024-07-29: 11:00:00 130 mg via INTRAVENOUS
  Filled 2024-07-29: qty 18

## 2024-07-29 MED ORDER — FAMOTIDINE IN NACL 20-0.9 MG/50ML-% IV SOLN
20.0000 mg | Freq: Once | INTRAVENOUS | Status: AC
  Administered 2024-07-29: 12:00:00 20 mg via INTRAVENOUS
  Filled 2024-07-29: qty 50

## 2024-07-29 MED ORDER — POTASSIUM CHLORIDE 10 MEQ/100ML IV SOLN
10.0000 meq | INTRAVENOUS | Status: AC
  Administered 2024-07-29: 12:00:00 10 meq via INTRAVENOUS
  Filled 2024-07-29 (×2): qty 100

## 2024-07-29 MED ORDER — CETIRIZINE HCL 10 MG/ML IV SOLN
10.0000 mg | Freq: Once | INTRAVENOUS | Status: AC
  Administered 2024-07-29: 11:00:00 10 mg via INTRAVENOUS
  Filled 2024-07-29: qty 1

## 2024-07-29 MED ORDER — GUAIFENESIN-DM 100-10 MG/5ML PO SYRP
10.0000 mL | ORAL_SOLUTION | Freq: Once | ORAL | Status: AC
  Administered 2024-07-29: 12:00:00 10 mL via ORAL
  Filled 2024-07-29: qty 10

## 2024-07-29 MED ORDER — ATROPINE SULFATE 1 MG/ML IV SOLN
0.5000 mg | Freq: Once | INTRAVENOUS | Status: AC | PRN
  Administered 2024-07-29: 13:00:00 0.5 mg via INTRAVENOUS
  Filled 2024-07-29: qty 1

## 2024-07-29 MED ORDER — SODIUM CHLORIDE 0.9 % IV SOLN: INTRAVENOUS | Status: DC

## 2024-07-29 NOTE — Progress Notes (Signed)
 Patient tolerated chemotherapy with no complaints voiced.  Side effects with management reviewed with understanding verbalized.  Port site clean and dry with no bruising or swelling noted at site.  Good blood return noted before and after administration of chemotherapy.  Band aid applied.  Patient left in satisfactory condition with VSS and no s/s of distress noted.

## 2024-07-29 NOTE — Patient Instructions (Signed)

## 2024-07-29 NOTE — Progress Notes (Signed)
 CRITICAL VALUE ALERT Critical value received:  K+ 2.7 Date of notification:  07-29-24 Time of notification: 1023 Critical value read back:  Yes.   Nurse who received alert:  C. Haevyn Ury RN MD notified time and response:  1024 Dr. Davonna will give K+ per standing orders today     Labs reviewed by MD today at office visit, ok to treat per MD. Kidney function noted as pt is on dialysis, K+ noted as well continue with treatment as planned.

## 2024-07-30 LAB — CANCER ANTIGEN 27.29: CA 27.29: 206.7 U/mL — ABNORMAL HIGH (ref 0.0–38.6)

## 2024-07-30 LAB — CANCER ANTIGEN 15-3: CA 15-3: 154 U/mL — ABNORMAL HIGH (ref 0.0–25.0)

## 2024-07-31 ENCOUNTER — Inpatient Hospital Stay

## 2024-08-04 ENCOUNTER — Inpatient Hospital Stay

## 2024-08-04 VITALS — BP 138/81 | HR 114 | Temp 97.2°F | Resp 20 | Wt 156.8 lb

## 2024-08-04 DIAGNOSIS — Z5112 Encounter for antineoplastic immunotherapy: Secondary | ICD-10-CM | POA: Diagnosis not present

## 2024-08-04 DIAGNOSIS — C50911 Malignant neoplasm of unspecified site of right female breast: Secondary | ICD-10-CM

## 2024-08-04 LAB — CBC WITH DIFFERENTIAL/PLATELET
Abs Immature Granulocytes: 0.16 K/uL — ABNORMAL HIGH (ref 0.00–0.07)
Basophils Absolute: 0.1 K/uL (ref 0.0–0.1)
Basophils Relative: 0 %
Eosinophils Absolute: 0.1 K/uL (ref 0.0–0.5)
Eosinophils Relative: 0 %
HCT: 29.9 % — ABNORMAL LOW (ref 36.0–46.0)
Hemoglobin: 9.3 g/dL — ABNORMAL LOW (ref 12.0–15.0)
Immature Granulocytes: 1 %
Lymphocytes Relative: 5 %
Lymphs Abs: 0.7 K/uL (ref 0.7–4.0)
MCH: 34.4 pg — ABNORMAL HIGH (ref 26.0–34.0)
MCHC: 31.1 g/dL (ref 30.0–36.0)
MCV: 110.7 fL — ABNORMAL HIGH (ref 80.0–100.0)
Monocytes Absolute: 1.5 K/uL — ABNORMAL HIGH (ref 0.1–1.0)
Monocytes Relative: 11 %
Neutro Abs: 11.3 K/uL — ABNORMAL HIGH (ref 1.7–7.7)
Neutrophils Relative %: 83 %
Platelets: 296 K/uL (ref 150–400)
RBC: 2.7 MIL/uL — ABNORMAL LOW (ref 3.87–5.11)
RDW: 23.5 % — ABNORMAL HIGH (ref 11.5–15.5)
Smear Review: NORMAL
WBC: 13.8 K/uL — ABNORMAL HIGH (ref 4.0–10.5)
nRBC: 0 % (ref 0.0–0.2)

## 2024-08-04 LAB — COMPREHENSIVE METABOLIC PANEL WITH GFR
ALT: 22 U/L (ref 0–44)
AST: 23 U/L (ref 15–41)
Albumin: 3.2 g/dL — ABNORMAL LOW (ref 3.5–5.0)
Alkaline Phosphatase: 112 U/L (ref 38–126)
Anion gap: 9 (ref 5–15)
BUN: 17 mg/dL (ref 8–23)
CO2: 33 mmol/L — ABNORMAL HIGH (ref 22–32)
Calcium: 8.3 mg/dL — ABNORMAL LOW (ref 8.9–10.3)
Chloride: 93 mmol/L — ABNORMAL LOW (ref 98–111)
Creatinine, Ser: 4.64 mg/dL — ABNORMAL HIGH (ref 0.44–1.00)
GFR, Estimated: 10 mL/min — ABNORMAL LOW
Glucose, Bld: 95 mg/dL (ref 70–99)
Potassium: 3.5 mmol/L (ref 3.5–5.1)
Sodium: 136 mmol/L (ref 135–145)
Total Bilirubin: 0.3 mg/dL (ref 0.0–1.2)
Total Protein: 6 g/dL — ABNORMAL LOW (ref 6.5–8.1)

## 2024-08-04 LAB — PHOSPHORUS: Phosphorus: 3.7 mg/dL (ref 2.5–4.6)

## 2024-08-04 LAB — MAGNESIUM: Magnesium: 2.1 mg/dL (ref 1.7–2.4)

## 2024-08-04 MED ORDER — CETIRIZINE HCL 10 MG/ML IV SOLN
10.0000 mg | Freq: Once | INTRAVENOUS | Status: AC
  Administered 2024-08-04: 10 mg via INTRAVENOUS
  Filled 2024-08-04: qty 1

## 2024-08-04 MED ORDER — GUAIFENESIN-DM 100-10 MG/5ML PO SYRP
10.0000 mL | ORAL_SOLUTION | Freq: Once | ORAL | Status: AC
  Administered 2024-08-04: 10 mL via ORAL
  Filled 2024-08-04: qty 10

## 2024-08-04 MED ORDER — FAMOTIDINE IN NACL 20-0.9 MG/50ML-% IV SOLN
20.0000 mg | Freq: Once | INTRAVENOUS | Status: AC
  Administered 2024-08-04: 20 mg via INTRAVENOUS
  Filled 2024-08-04: qty 50

## 2024-08-04 MED ORDER — SODIUM CHLORIDE 0.9 % IV SOLN
5.0000 mg/kg | Freq: Once | INTRAVENOUS | Status: AC
  Administered 2024-08-04: 360 mg via INTRAVENOUS
  Filled 2024-08-04: qty 36

## 2024-08-04 MED ORDER — PALONOSETRON HCL INJECTION 0.25 MG/5ML
0.2500 mg | Freq: Once | INTRAVENOUS | Status: AC
  Administered 2024-08-04: 0.25 mg via INTRAVENOUS
  Filled 2024-08-04: qty 5

## 2024-08-04 MED ORDER — SODIUM CHLORIDE 0.9 % IV SOLN: INTRAVENOUS | Status: DC

## 2024-08-04 MED ORDER — ATROPINE SULFATE 1 MG/ML IV SOLN
0.5000 mg | Freq: Once | INTRAVENOUS | Status: AC
  Administered 2024-08-04: 0.5 mg via INTRAVENOUS
  Filled 2024-08-04: qty 1

## 2024-08-04 MED ORDER — ACETAMINOPHEN 325 MG PO TABS
650.0000 mg | ORAL_TABLET | Freq: Once | ORAL | Status: AC
  Administered 2024-08-04: 650 mg via ORAL
  Filled 2024-08-04: qty 2

## 2024-08-04 MED ORDER — APREPITANT 130 MG/18ML IV EMUL
130.0000 mg | Freq: Once | INTRAVENOUS | Status: AC
  Administered 2024-08-04: 130 mg via INTRAVENOUS
  Filled 2024-08-04: qty 18

## 2024-08-04 MED ORDER — DEXAMETHASONE SOD PHOSPHATE PF 10 MG/ML IJ SOLN
10.0000 mg | Freq: Once | INTRAMUSCULAR | Status: AC
  Administered 2024-08-04: 10 mg via INTRAVENOUS

## 2024-08-04 NOTE — Patient Instructions (Signed)
 [facility] [place] - A DEPT OF [place]  Discharge Instructions: Thank you for choosing [facility] to provide your oncology and hematology care.  If you have a lab appointment with the Cancer Center - please note that after April 8th, 2024, all labs will be drawn in the cancer center.  You do not have to check in or register with the main entrance as you have in the past but will complete your check-in in the cancer center.  Wear comfortable clothing and clothing appropriate for easy access to any Portacath or PICC line.   We strive to give you quality time with your provider. You may need to reschedule your appointment if you arrive late (15 or more minutes).  Arriving late affects you and other patients whose appointments are after yours.  Also, if you miss three or more appointments without notifying the office, you may be dismissed from the clinic at the provider's discretion.      For prescription refill requests, have your pharmacy contact our office and allow 72 hours for refills to be completed.    Today you received the following chemotherapy and/or immunotherapy agents Megan [name]   To help prevent nausea and vomiting after your treatment, we encourage you to take your nausea medication as directed.  BELOW ARE SYMPTOMS THAT SHOULD BE REPORTED IMMEDIATELY: *FEVER GREATER THAN 100.4 F (38 C) OR HIGHER *CHILLS OR SWEATING *NAUSEA AND VOMITING THAT IS NOT CONTROLLED WITH YOUR NAUSEA MEDICATION *UNUSUAL SHORTNESS OF BREATH *UNUSUAL BRUISING OR BLEEDING *URINARY PROBLEMS (pain or burning when urinating, or frequent urination) *BOWEL PROBLEMS (unusual diarrhea, constipation, pain near the anus) TENDERNESS IN MOUTH AND THROAT WITH OR WITHOUT PRESENCE OF ULCERS (sore throat, sores in mouth, or a toothache) UNUSUAL RASH, SWELLING OR PAIN  UNUSUAL VAGINAL DISCHARGE OR ITCHING   Items with * indicate a potential emergency and should be followed up as  soon as possible or go to the Emergency Department if any problems should occur.  Please show the CHEMOTHERAPY ALERT CARD or IMMUNOTHERAPY ALERT CARD at check-in to the Emergency Department and triage nurse.  Should you have questions after your visit or need to cancel or reschedule your appointment, please contact [facility] [place] - A DEPT OF [facility] [phone]  and follow the prompts.  Office hours are 8:00 a.m. to 4:30 p.m. Monday - Friday. Please note that voicemails left after 4:00 p.m. may not be returned until the following business day.  We are closed weekends and major holidays. You have access to a nurse at all times for urgent questions. Please call the main number to the clinic [phone] and follow the prompts.  For any non-urgent questions, you may also contact your provider using MyChart. We now offer e-Visits for anyone 15 and older to request care online for non-urgent symptoms. For details visit [email or web address].   Also download the MyChart app! Go to the app store, search "MyChart", open the app, select [place], and log in with your MyChart username and password.

## 2024-08-04 NOTE — Progress Notes (Signed)
 Patient presents today for Trodevly infusion per providers order.  Vital signs and labs reviewed.  Heart rate noted to be 118, MD notified.  Per Dr. Davonna, okay to proceed with treatment.  Patient will infuse Trodelvy  over 1 hour today per Pharmacist, okay to override rate.  Treatment given today per MD orders.  Stable during infusion without adverse affects.  Vital signs stable.  No complaints at this time.  Discharge from clinic via wheelchair in stable condition.  Alert and oriented X 3.  Follow up with [facility] as scheduled.

## 2024-08-05 ENCOUNTER — Inpatient Hospital Stay

## 2024-08-06 ENCOUNTER — Inpatient Hospital Stay

## 2024-08-06 ENCOUNTER — Telehealth: Payer: Self-pay | Admitting: Physician Assistant

## 2024-08-06 VITALS — BP 129/82 | HR 118 | Temp 96.7°F | Resp 20

## 2024-08-06 DIAGNOSIS — C7800 Secondary malignant neoplasm of unspecified lung: Secondary | ICD-10-CM

## 2024-08-06 DIAGNOSIS — R053 Chronic cough: Secondary | ICD-10-CM

## 2024-08-06 DIAGNOSIS — Z5112 Encounter for antineoplastic immunotherapy: Secondary | ICD-10-CM | POA: Diagnosis not present

## 2024-08-06 DIAGNOSIS — K5903 Drug induced constipation: Secondary | ICD-10-CM

## 2024-08-06 DIAGNOSIS — C50911 Malignant neoplasm of unspecified site of right female breast: Secondary | ICD-10-CM

## 2024-08-06 MED ORDER — DOCUSATE SODIUM 100 MG PO CAPS: 100.0000 mg | ORAL_CAPSULE | Freq: Two times a day (BID) | ORAL | 0 refills | Status: AC | PRN

## 2024-08-06 MED ORDER — PEGFILGRASTIM-FPGK 6 MG/0.6ML ~~LOC~~ SOSY
6.0000 mg | PREFILLED_SYRINGE | Freq: Once | SUBCUTANEOUS | Status: AC
  Administered 2024-08-06: 6 mg via SUBCUTANEOUS
  Filled 2024-08-06: qty 0.6

## 2024-08-06 MED ORDER — OXYCODONE-ACETAMINOPHEN 2.5-325 MG PO TABS: 1.0000 | ORAL_TABLET | Freq: Every evening | ORAL | 0 refills | Status: AC | PRN

## 2024-08-06 NOTE — Telephone Encounter (Signed)
 Patient follows with Dr. 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 Notified by nurse that patient was requesting additional cough medication. I called and spoke with patient Megan [name]) and her husband Megan [name]) via telephone.  HISTORY OF PRESENT COMPLAINT: Patient has had severe progressive cough for several months, attributed to her metastatic lung disease. She was also hospitalized in December 2025 (07/20/2024 through 07/23/2024) for COPD exacerbation, acute on chronic hypoxic respiratory failure, and acute bronchitis with bronchospasm. Patient was treated with antibiotics and steroids, but without improvement in cough. She has inhalers and nebulizers at home, with no improvement in cough. She denies any worsening shortness of breath or wheezing, reports that her breathing is just fine, she just cannot stop coughing. Cough is nonproductive, keeps patient up at night. Cough has been refractory to Delsym , Mucinex , Tessalon  Perles, and Tussidex [employer] (hydrocodone /chlorpheniramine).  Per discussion with Dr. Davonna, she recommended low-dose oxycodone  as cough suppressant especially at night in order to help patient's sleep.  Patient is overall opioid nave (had short course of narcotic pain medication a month ago for chest pain, took as needed x 2 days, then stopped).  Rx for oxycodone /acetaminophen  2.5 mg / 325 mg sent to patient pharmacy. (Lowest dose available used due to patient being on hemodialysis.) Advised patient's husband to watch for any increased lethargy or respiratory depression and to seek immediate medical attention if patient observed to have <8 respirations per minute. Rx for stool softener sent to pharmacy. Patient/husband advised to call back or proceed to emergency department if any new or worsening symptoms.  Pleasant Megan Barefoot, PA-C 08/06/2024 12:25 PM

## 2024-08-06 NOTE — Patient Instructions (Signed)
 [facility] [place] - A DEPT OF [place]  Discharge Instructions: Thank you for choosing  Cancer Center to provide your oncology and hematology care.  If you have a lab appointment with the Cancer Center - please note that after April 8th, 2024, all labs will be drawn in the cancer center.  You do not have to check in or register with the main entrance as you have in the past but will complete your check-in in the cancer center.  Wear comfortable clothing and clothing appropriate for easy access to any Portacath or PICC line.   We strive to give you quality time with your provider. You may need to reschedule your appointment if you arrive late (15 or more minutes).  Arriving late affects you and other patients whose appointments are after yours.  Also, if you miss three or more appointments without notifying the office, you may be dismissed from the clinic at the provider's discretion.      For prescription refill requests, have your pharmacy contact our office and allow 72 hours for refills to be completed.    Today you received the following chemotherapy and/or immunotherapy agents STIMUFEND . Pegfilgrastim  Injection What is this medication? PEGFILGRASTIM  (PEG fil gra stim) lowers the risk of infection in people who are receiving chemotherapy. It works by Systems analyst make more white blood cells, which protects your body from infection. It may also be used to help people who have been exposed to high doses of radiation. This medicine may be used for other purposes; ask your health care provider or pharmacist if you have questions. COMMON BRAND NAME(S): Fulphila , Fylnetra , Neulasta , Nyvepria , Stimufend , UDENYCA , UDENYCA  ONBODY, Ziextenzo  What should I tell my care team before I take this medication? They need to know if you have any of these conditions: Kidney disease Latex allergy Ongoing radiation therapy Sickle cell disease Skin reactions to  acrylic adhesives (On-Body Injector only) An unusual or allergic reaction to pegfilgrastim , filgrastim, other medications, foods, dyes, or preservatives Pregnant or trying to get pregnant Breast-feeding How should I use this medication? This medication is for injection under the skin. If you get this medication at home, you will be taught how to prepare and give the pre-filled syringe or how to use the On-body Injector. Refer to the patient Instructions for Use for detailed instructions. Use exactly as directed. Tell your care team immediately if you suspect that the On-body Injector may not have performed as intended or if you suspect the use of the On-body Injector resulted in a missed or partial dose. It is important that you put your used needles and syringes in a special sharps container. Do not put them in a trash can. If you do not have a sharps container, call your pharmacist or care team to get one. Talk to your care team about the use of this medication in children. While this medication may be prescribed for selected conditions, precautions do apply. Overdosage: If you think you have taken too much of this medicine contact a poison control center or emergency room at once. NOTE: This medicine is only for you. Do not share this medicine with others. What if I miss a dose? It is important not to miss your dose. Call your care team if you miss your dose. If you miss a dose due to an On-body Injector failure or leakage, a new dose should be administered as soon as possible using a single prefilled syringe for manual  use. What may interact with this medication? Interactions have not been studied. This list may not describe all possible interactions. Give your health care provider a list of all the medicines, herbs, non-prescription drugs, or dietary supplements you use. Also tell them if you smoke, drink alcohol , or use illegal drugs. Some items may interact with your medicine. What should I  watch for while using this medication? Your condition will be monitored carefully while you are receiving this medication. You may need blood work done while you are taking this medication. Talk to your care team about your risk of cancer. You may be more at risk for certain types of cancer if you take this medication. If you are going to need a MRI, CT scan, or other procedure, tell your care team that you are using this medication (On-Body Injector only). What side effects may I notice from receiving this medication? Side effects that you should report to your care team as soon as possible: Allergic reactions--skin rash, itching, hives, swelling of the face, lips, tongue, or throat Capillary leak syndrome--stomach or muscle pain, unusual weakness or fatigue, feeling faint or lightheaded, decrease in the amount of urine, swelling of the ankles, hands, or feet, trouble breathing High white blood cell level--fever, fatigue, trouble breathing, night sweats, change in vision, weight loss Inflammation of the aorta--fever, fatigue, back, chest, or stomach pain, severe headache Kidney injury (glomerulonephritis)--decrease in the amount of urine, red or dark brown urine, foamy or bubbly urine, swelling of the ankles, hands, or feet Shortness of breath or trouble breathing Spleen injury--pain in upper left stomach or shoulder Unusual bruising or bleeding Side effects that usually do not require medical attention (report to your care team if they continue or are bothersome): Bone pain Pain in the hands or feet This list may not describe all possible side effects. Call your doctor for medical advice about side effects. You may report side effects to FDA at 1-800-FDA-1088. Where should I keep my medication? Keep out of the reach of children. If you are using this medication at home, you will be instructed on how to store it. Throw away any unused medication after the expiration date on the label. NOTE:  This sheet is a summary. It may not cover all possible information. If you have questions about this medicine, talk to your doctor, pharmacist, or health care provider.  2024 Elsevier/Gold Standard (2021-07-01 00:00:00)      To help prevent nausea and vomiting after your treatment, we encourage you to take your nausea medication as directed.  BELOW ARE SYMPTOMS THAT SHOULD BE REPORTED IMMEDIATELY: *FEVER GREATER THAN 100.4 F (38 C) OR HIGHER *CHILLS OR SWEATING *NAUSEA AND VOMITING THAT IS NOT CONTROLLED WITH YOUR NAUSEA MEDICATION *UNUSUAL SHORTNESS OF BREATH *UNUSUAL BRUISING OR BLEEDING *URINARY PROBLEMS (pain or burning when urinating, or frequent urination) *BOWEL PROBLEMS (unusual diarrhea, constipation, pain near the anus) TENDERNESS IN MOUTH AND THROAT WITH OR WITHOUT PRESENCE OF ULCERS (sore throat, sores in mouth, or a toothache) UNUSUAL RASH, SWELLING OR PAIN  UNUSUAL VAGINAL DISCHARGE OR ITCHING   Items with * indicate a potential emergency and should be followed up as soon as possible or go to the Emergency Department if any problems should occur.  Please show the CHEMOTHERAPY ALERT CARD or IMMUNOTHERAPY ALERT CARD at check-in to the Emergency Department and triage nurse.  Should you have questions after your visit or need to cancel or reschedule your appointment, please contact [facility] [place] - A DEPT  OF [facility] [phone]  and follow the prompts.  Office hours are 8:00 a.m. to 4:30 p.m. Monday - Friday. Please note that voicemails left after 4:00 p.m. may not be returned until the following business day.  We are closed weekends and major holidays. You have access to a nurse at all times for urgent questions. Please call the main number to the clinic [phone] and follow the prompts.  For any non-urgent questions, you may also contact your provider using MyChart. We now offer e-Visits for anyone 2 and older to request care online for  non-urgent symptoms. For details visit [email or web address].   Also download the MyChart app! Go to the app store, search MyChart, open the app, select [place], and log in with your MyChart username and password.

## 2024-08-06 NOTE — Progress Notes (Signed)
 Megan [name] presents today for injection per the provider's orders.  STIMUFEND  administration without incident; injection site WNL; see MAR for injection details.  Patient tolerated procedure well and without incident.  No questions or complaints noted at this time. Patient has complaints of coughing that is not relieved by any of the over the counter medications she has been instructed to take. Message sent to Dr. Davonna. Discharged from clinic by wheel chair in stable condition. Alert and oriented x 3. F/U with [facility] as scheduled.

## 2024-08-10 ENCOUNTER — Encounter (HOSPITAL_COMMUNITY): Payer: Self-pay

## 2024-08-10 ENCOUNTER — Other Ambulatory Visit: Payer: Self-pay

## 2024-08-10 ENCOUNTER — Emergency Department (HOSPITAL_COMMUNITY)

## 2024-08-10 ENCOUNTER — Other Ambulatory Visit (HOSPITAL_COMMUNITY)

## 2024-08-10 ENCOUNTER — Emergency Department (HOSPITAL_COMMUNITY)
Admission: EM | Admit: 2024-08-10 | Discharge: 2024-08-10 | Disposition: A | Discharge status: Home / Self Care | Source: Ambulatory Visit | Attending: Emergency Medicine | Admitting: Emergency Medicine

## 2024-08-10 DIAGNOSIS — R059 Cough, unspecified: Secondary | ICD-10-CM | POA: Diagnosis present

## 2024-08-10 DIAGNOSIS — Z79899 Other long term (current) drug therapy: Secondary | ICD-10-CM | POA: Diagnosis not present

## 2024-08-10 DIAGNOSIS — R000 Tachycardia, unspecified: Secondary | ICD-10-CM | POA: Diagnosis not present

## 2024-08-10 DIAGNOSIS — Z992 Dependence on renal dialysis: Secondary | ICD-10-CM | POA: Diagnosis not present

## 2024-08-10 DIAGNOSIS — Z7982 Long term (current) use of aspirin: Secondary | ICD-10-CM | POA: Insufficient documentation

## 2024-08-10 DIAGNOSIS — Z853 Personal history of malignant neoplasm of breast: Secondary | ICD-10-CM | POA: Diagnosis not present

## 2024-08-10 DIAGNOSIS — R053 Chronic cough: Secondary | ICD-10-CM

## 2024-08-10 DIAGNOSIS — R0600 Dyspnea, unspecified: Secondary | ICD-10-CM | POA: Diagnosis not present

## 2024-08-10 DIAGNOSIS — R058 Other specified cough: Secondary | ICD-10-CM | POA: Diagnosis not present

## 2024-08-10 DIAGNOSIS — I120 Hypertensive chronic kidney disease with stage 5 chronic kidney disease or end stage renal disease: Secondary | ICD-10-CM | POA: Diagnosis not present

## 2024-08-10 DIAGNOSIS — E1122 Type 2 diabetes mellitus with diabetic chronic kidney disease: Secondary | ICD-10-CM | POA: Insufficient documentation

## 2024-08-10 DIAGNOSIS — R790 Abnormal level of blood mineral: Secondary | ICD-10-CM | POA: Insufficient documentation

## 2024-08-10 DIAGNOSIS — N186 End stage renal disease: Secondary | ICD-10-CM | POA: Diagnosis not present

## 2024-08-10 LAB — BLOOD GAS, ARTERIAL
Acid-Base Excess: 12.9 mmol/L — ABNORMAL HIGH (ref 0.0–2.0)
Bicarbonate: 39.1 mmol/L — ABNORMAL HIGH (ref 20.0–28.0)
Drawn by: 22179
O2 Saturation: 99.7 %
Patient temperature: 36.5
pCO2 arterial: 54 mmHg — ABNORMAL HIGH (ref 32–48)
pH, Arterial: 7.47 — ABNORMAL HIGH (ref 7.35–7.45)
pO2, Arterial: 99 mmHg (ref 83–108)

## 2024-08-10 LAB — BASIC METABOLIC PANEL WITH GFR
Anion gap: 17 — ABNORMAL HIGH (ref 5–15)
BUN: 9 mg/dL (ref 8–23)
CO2: 27 mmol/L (ref 22–32)
Calcium: 8.7 mg/dL — ABNORMAL LOW (ref 8.9–10.3)
Chloride: 96 mmol/L — ABNORMAL LOW (ref 98–111)
Creatinine, Ser: 2.72 mg/dL — ABNORMAL HIGH (ref 0.44–1.00)
GFR, Estimated: 19 mL/min — ABNORMAL LOW
Glucose, Bld: 111 mg/dL — ABNORMAL HIGH (ref 70–99)
Potassium: 3.3 mmol/L — ABNORMAL LOW (ref 3.5–5.1)
Sodium: 140 mmol/L (ref 135–145)

## 2024-08-10 LAB — CBC WITH DIFFERENTIAL/PLATELET
Abs Immature Granulocytes: 0.3 K/uL — ABNORMAL HIGH (ref 0.00–0.07)
Band Neutrophils: 8 %
Basophils Absolute: 0 K/uL (ref 0.0–0.1)
Basophils Relative: 0 %
Eosinophils Absolute: 0 K/uL (ref 0.0–0.5)
Eosinophils Relative: 0 %
HCT: 30.3 % — ABNORMAL LOW (ref 36.0–46.0)
Hemoglobin: 9.4 g/dL — ABNORMAL LOW (ref 12.0–15.0)
Lymphocytes Relative: 6 %
Lymphs Abs: 1.7 K/uL (ref 0.7–4.0)
MCH: 35.1 pg — ABNORMAL HIGH (ref 26.0–34.0)
MCHC: 31 g/dL (ref 30.0–36.0)
MCV: 113.1 fL — ABNORMAL HIGH (ref 80.0–100.0)
Metamyelocytes Relative: 1 %
Monocytes Absolute: 1.4 K/uL — ABNORMAL HIGH (ref 0.1–1.0)
Monocytes Relative: 5 %
Neutro Abs: 25 K/uL — ABNORMAL HIGH (ref 1.7–7.7)
Neutrophils Relative %: 80 %
Platelets: 208 K/uL (ref 150–400)
RBC: 2.68 MIL/uL — ABNORMAL LOW (ref 3.87–5.11)
RDW: 22.5 % — ABNORMAL HIGH (ref 11.5–15.5)
Smear Review: NORMAL
WBC: 28.4 K/uL — ABNORMAL HIGH (ref 4.0–10.5)
nRBC: 0.1 % (ref 0.0–0.2)

## 2024-08-10 LAB — RESP PANEL BY RT-PCR (RSV, FLU A&B, COVID)  RVPGX2
Influenza A by PCR: NEGATIVE
Influenza B by PCR: NEGATIVE
Resp Syncytial Virus by PCR: NEGATIVE
SARS Coronavirus 2 by RT PCR: NEGATIVE

## 2024-08-10 LAB — D-DIMER, QUANTITATIVE: D-Dimer, Quant: 1.12 ug{FEU}/mL — ABNORMAL HIGH (ref 0.00–0.50)

## 2024-08-10 MED ORDER — OXYCODONE HCL 5 MG PO TABS
2.5000 mg | ORAL_TABLET | Freq: Once | ORAL | Status: AC
  Administered 2024-08-10: 2.5 mg via ORAL
  Filled 2024-08-10: qty 1

## 2024-08-10 MED ORDER — LIDOCAINE HCL (PF) 2% IJ FOR NEBU
2.5000 mL | Freq: Once | RESPIRATORY_TRACT | Status: AC
  Administered 2024-08-10: 2.5 mL via RESPIRATORY_TRACT
  Filled 2024-08-10: qty 5

## 2024-08-10 MED ORDER — SODIUM CHLORIDE 0.9 % IV BOLUS
250.0000 mL | Freq: Once | INTRAVENOUS | Status: AC
  Administered 2024-08-10: 250 mL via INTRAVENOUS

## 2024-08-10 MED ORDER — ALBUTEROL SULFATE (2.5 MG/3ML) 0.083% IN NEBU
2.5000 mg | INHALATION_SOLUTION | Freq: Once | RESPIRATORY_TRACT | Status: AC
  Administered 2024-08-10: 2.5 mg via RESPIRATORY_TRACT
  Filled 2024-08-10: qty 3

## 2024-08-10 MED ORDER — HEPARIN SOD (PORK) LOCK FLUSH 100 UNIT/ML IV SOLN
INTRAVENOUS | Status: AC
  Administered 2024-08-10: 500 [IU]
  Filled 2024-08-10: qty 5

## 2024-08-10 MED ORDER — IOHEXOL 350 MG/ML SOLN
75.0000 mL | Freq: Once | INTRAVENOUS | Status: AC | PRN
  Administered 2024-08-10: 75 mL via INTRAVENOUS

## 2024-08-10 MED ORDER — HEPARIN SOD (PORK) LOCK FLUSH 100 UNIT/ML IV SOLN: 500.0000 [IU] | Freq: Once | INTRAVENOUS | Status: AC

## 2024-08-10 MED ORDER — PROMETHAZINE-CODEINE 6.25-10 MG/5ML PO SYRP: 5.0000 mL | ORAL_SOLUTION | Freq: Once | ORAL | Status: DC

## 2024-08-10 MED ORDER — GUAIFENESIN-CODEINE 100-10 MG/5ML PO SOLN
5.0000 mL | Freq: Once | ORAL | Status: AC
  Administered 2024-08-10: 5 mL via ORAL
  Filled 2024-08-10: qty 5

## 2024-08-10 NOTE — Progress Notes (Signed)
 Approximately 1456 a nebulizer treatment of 2% Lidocaine  was administered to the patient  per doctors order for her chronic cough. The  patient disliked the taste and scent of medication (stating it was too strong) and did not complete the  treatment.  Her 3L/m [place] was reapplied.

## 2024-08-10 NOTE — Discharge Instructions (Signed)
 As discussed,  the safest medicine for your cough control is the oxycodone  that was prescribed by your oncologist.  Since you were unable to get the 2.5 mg tablets filled,  it is fine for you to break your 5 mg tablets in 1/2 and take 1/2 tablet daily for cough relief.  You may find your cough returns after dialysis as this medicine will disappear with your dialysis treatment - you may take a new dose every night or after your dialysis sessions.

## 2024-08-10 NOTE — ED Triage Notes (Signed)
 Pt was in dialysis today and started coughing really bad, cough has been going on since Thursday. Pt is a cancer pt, called upstairs they called in cough medicine yesterday and recommended mucinex . Pt is on 2L of O2 at all times.

## 2024-08-10 NOTE — ED Provider Notes (Signed)
 " [place] [facility] AT [facility] Provider Note   CSN: [id number] Arrival date & time: 08/10/24  9079     Patient presents with: Cough   Megan [name] is a 63 y.o. female with a history significant for breast cancer with pulmonary metastasis under the care of Dr. Anda, also history of end-stage renal disease who completed dialysis this morning, type 2 diabetes, GERD, hypertension who was recently treated for an acute bronchitis but persistently has cough which has been progressively worsening.  Her cough is nonproductive, she denies fevers or chills, she coughs at rest although any attempt at speaking worsens this symptom.  She states she barely got an hour sleep last night as she could not stop coughing.  She has tried multiple medications including Mucinex , Tessalon , was prescribed low-dose oxycodone  by her oncologist which she states is not helpful either.  She took her last dose of oxycodone  this morning around 3 AM but it had no effect.  She is currently on nystatin  secondary to oral thrush.  She does report midsternal pain when coughing.  She is on 2 L oxygen  nasal cannula at all times.   The history is provided by the patient and the spouse.       Prior to Admission medications  Medication Sig Start Date End Date Taking? Authorizing Provider  acetaminophen  (TYLENOL ) 500 MG tablet Take 1,000 mg by mouth every 8 (eight) hours as needed for moderate pain (pain score 4-6).    [provider]  albuterol  (PROVENTIL ) (2.5 MG/3ML) 0.083% nebulizer solution Take 3 mLs (2.5 mg total) by nebulization every 4 (four) hours as needed for wheezing or shortness of breath. 05/15/24 05/15/25  Pearlean Manus, MD  albuterol  (VENTOLIN  HFA) 108 (90 Base) MCG/ACT inhaler Inhale 2 puffs into the lungs every 6 (six) hours as needed for wheezing or shortness of breath. 05/15/24   Pearlean Manus, MD  aspirin  EC 81 MG tablet Take 81 mg by mouth daily.    [provider]  azithromycin  (ZITHROMAX ) 500 MG tablet Take 1 tablet (500 mg total) by mouth daily. 07/24/24   Evonnie Lenis, MD  B Complex-C-Zn-Folic Acid  (DIALYVITE  800-ZINC  15) 0.8 MG TABS Take 1 tablet by mouth daily. 06/19/22   [provider]  benzonatate  (TESSALON ) 200 MG capsule Take 1 capsule (200 mg total) by mouth 2 (two) times daily. 08/11/24   Kandala, Hyndavi, MD  cefdinir  (OMNICEF ) 300 MG capsule Take 1 capsule (300 mg total) by mouth daily. 07/24/24   Evonnie Lenis, MD  cinacalcet  (SENSIPAR ) 90 MG tablet Take 90 mg by mouth daily. 03/17/24   [provider]  Darbepoetin Alfa  (ARANESP , ALBUMIN  FREE, IJ) Inject 200 mcg into the vein every Friday. 07/04/24 07/03/25  [provider]  diphenoxylate -atropine  (LOMOTIL ) 2.5-0.025 MG tablet Take 1 tablet by mouth 4 (four) times daily as needed for diarrhea or loose stools. 07/01/24   Geofm Delon BRAVO, NP  docusate sodium  (COLACE) 100 MG capsule Take 1 capsule (100 mg total) by mouth 2 (two) times daily as needed for mild constipation (Constipation related to opioid medication). 08/06/24   Lamon Pleasant HERO, PA-C  gabapentin  (NEURONTIN ) 300 MG capsule TAKE 1 CAPSULE(300 MG) BY MOUTH AT BEDTIME 06/12/24   Duanne Butler DASEN, MD  lanthanum  (FOSRENOL ) 1000 MG chewable tablet Chew 2,000-3,000 mg by mouth See admin instructions. Take 3 tablets (3000 mg) by mouth with meals and take 2 tablets (2000 mg) with snacks 10/16/19   [provider]  lidocaine -prilocaine  (EMLA )  cream Apply to affected area once Patient taking differently: Apply 1 Application topically every Monday, Wednesday, and Friday with hemodialysis. Apply to affected area once 05/29/24   Kandala, Hyndavi, MD  midodrine  (PROAMATINE ) 2.5 MG tablet Take 2.5 mg by mouth See admin instructions. Monday,Wednesday and Friday Only take on dialysis days    [provider]  Nebulizers (COMPRESSOR/NEBULIZER) MISC 1 Units by Does not apply route daily as needed.  05/15/24   Pearlean Manus, MD  nystatin  (MYCOSTATIN ) 100000 UNIT/ML suspension Take 5 mLs (500,000 Units total) by mouth 4 (four) times daily. Swish and swallow 07/01/24   Davonna Siad, MD  ondansetron  (ZOFRAN ) 8 MG tablet Take 1 tablet (8 mg total) by mouth every 8 (eight) hours as needed for nausea or vomiting. Start on the third day after chemotherapy. 05/29/24   Davonna Siad, MD  oxycodone -acetaminophen  (PERCOCET) 2.5-325 MG tablet Take 1 tablet by mouth at bedtime as needed (Severe refractory cough). 08/06/24   Lamon Pleasant HERO, PA-C  pantoprazole  (PROTONIX ) 40 MG tablet Take 1 tablet (40 mg total) by mouth 2 (two) times daily before a meal. 10/08/23   Rourk, Lamar HERO, MD  predniSONE  (DELTASONE ) 10 MG tablet Take 5 tablets (50 mg total) by mouth daily with breakfast. And decrease by one tablet daily 07/24/24   Tat, Alm, MD    Allergies: Motrin [ibuprofen] and Trodelvy  [sacituzumab govitecan -hziy]    Review of Systems  Constitutional:  Positive for fatigue. Negative for chills and fever.  HENT:  Positive for mouth sores and sore throat. Negative for congestion.   Eyes: Negative.   Respiratory:  Positive for cough and shortness of breath. Negative for choking, chest tightness and wheezing.   Cardiovascular:  Negative for chest pain.  Gastrointestinal:  Negative for abdominal pain, nausea and vomiting.  Genitourinary: Negative.   Musculoskeletal:  Negative for arthralgias, joint swelling and neck pain.  Skin: Negative.  Negative for rash and wound.  Neurological:  Negative for dizziness, weakness, light-headedness, numbness and headaches.  Psychiatric/Behavioral: Negative.      Updated Vital Signs BP 122/79 (BP Location: Right Arm)   Pulse (!) 112   Temp 97.9 F (36.6 C) (Oral)   Resp (!) 26   Ht 5' 2 (1.575 m)   Wt 70.9 kg   SpO2 97%   BMI 28.59 kg/m   Physical Exam Vitals and nursing note reviewed.  Constitutional:      Appearance: She is well-developed.   HENT:     Head: Normocephalic and atraumatic.     Mouth/Throat:     Pharynx: Oropharyngeal exudate present.     Comments: Oral thrush is present. Eyes:     Conjunctiva/sclera: Conjunctivae normal.  Cardiovascular:     Rate and Rhythm: Regular rhythm. Tachycardia present.     Heart sounds: Normal heart sounds.  Pulmonary:     Effort: Respiratory distress present.     Breath sounds: Normal breath sounds. No wheezing or rhonchi.  Abdominal:     General: Bowel sounds are normal.     Palpations: Abdomen is soft.     Tenderness: There is no abdominal tenderness.  Musculoskeletal:        General: No tenderness. Normal range of motion.     Cervical back: Normal range of motion.     Right lower leg: No edema.     Left lower leg: No edema.  Skin:    General: Skin is warm and dry.  Neurological:     Mental Status: She is alert.     (  all labs ordered are listed, but only abnormal results are displayed) Labs Reviewed  CBC WITH DIFFERENTIAL/PLATELET - Abnormal; Notable for the following components:      Result Value   WBC 28.4 (*)    RBC 2.68 (*)    Hemoglobin 9.4 (*)    HCT 30.3 (*)    MCV 113.1 (*)    MCH 35.1 (*)    RDW 22.5 (*)    Neutro Abs 25.0 (*)    Monocytes Absolute 1.4 (*)    Abs Immature Granulocytes 0.30 (*)    All other components within normal limits  BASIC METABOLIC PANEL WITH GFR - Abnormal; Notable for the following components:   Potassium 3.3 (*)    Chloride 96 (*)    Glucose, Bld 111 (*)    Creatinine, Ser 2.72 (*)    Calcium  8.7 (*)    GFR, Estimated 19 (*)    Anion gap 17 (*)    All other components within normal limits  D-DIMER, QUANTITATIVE - Abnormal; Notable for the following components:   D-Dimer, Quant 1.12 (*)    All other components within normal limits  BLOOD GAS, ARTERIAL - Abnormal; Notable for the following components:   pH, Arterial 7.47 (*)    pCO2 arterial 54 (*)    Bicarbonate 39.1 (*)    Acid-Base Excess 12.9 (*)    All other  components within normal limits  RESP PANEL BY RT-PCR (RSV, FLU A&B, COVID)  [id number]    EKG: EKG Interpretation Date/Time:  Sunday August 10 2024 11:22:17 EST Ventricular Rate:  126 PR Interval:  120 QRS Duration:  87 QT Interval:  333 QTC Calculation: 483 R Axis:   -6  Text Interpretation: Sinus tachycardia Left atrial enlargement Probable left ventricular hypertrophy Nonspecific T abnormalities, lateral leads No significant change since last tracing Confirmed by Ellouise Fine ([id number]) on 08/11/2024 3:34:52 PM  Radiology: CT Angio Chest PE W and/or Wo Contrast Result Date: 08/10/2024 EXAM: CTA CHEST 08/10/2024 11:56:53 AM TECHNIQUE: CTA of the chest was performed after the administration of intravenous contrast. Without and with IV contrast was administered, including 75 mL iohexol  (OMNIPAQUE ) 350 MG/ML injection. Multiplanar reformatted images are provided for review. MIP images are provided for review. Automated exposure control, iterative reconstruction, and/or weight based adjustment of the mA/kV was utilized to reduce the radiation dose to as low as reasonably achievable. COMPARISON: Chest x-ray 07/18/2024. CLINICAL HISTORY: Pulmonary embolism (PE) suspected, high prob. FINDINGS: PULMONARY ARTERIES: Pulmonary arteries are adequately opacified for evaluation. No acute pulmonary embolus. Main pulmonary artery is normal in caliber. MEDIASTINUM: Cardiomegaly. The heart and pericardium demonstrate no acute abnormality. The thoracic aorta is normal in caliber. LYMPH NODES: Similar appearing bilateral perihilar soft tissue densities likely representing conglomerate lymph nodes. No mediastinal or axillary lymphadenopathy. LUNGS AND PLEURA: Grossly stable in size and number of innumerable bilateral pulmonary solid calcified and noncalcified nodules consistent with no metastases. Interval increase in size of a trace to small volume right pleural effusion. Redemonstration of pleural nodules. No  pneumothorax. UPPER ABDOMEN: Limited images of the upper abdomen demonstrate bilateral nonobstructive nephrolithiasis. There is a right renal 1.2 cm lesion with a density of 71 Hounsfield units. Tiny hiatal hernia. Status post cholecystectomy. SOFT TISSUES AND BONES: No acute bone or soft tissue abnormality. IMPRESSION: 1. No pulmonary embolism. 2. Interval increase in size of a trace to small volume right pleural effusion. 3. Indeterminate 1.2 cm right renal lesion measuring 71 HU; recommend renal protocol MRI (preferred) or CT without  and with IV contrast for characterization. 4. Similar appearing innumerable pulmonary and pleural metastases. Electronically signed by: Morgane Naveau MD 08/10/2024 01:15 PM EST RP Workstation: [id number]   DG Chest 2 View Result Date: 08/10/2024 EXAM: 2 VIEW(S) XRAY OF THE CHEST 08/10/2024 10:27:00 AM COMPARISON: 07/20/2024 CLINICAL HISTORY: 63 year old female with shortness of breath and metastatic breast cancer. FINDINGS: LINES, TUBES AND DEVICES: Right chest port stable in place. LUNGS AND PLEURA: Severe extensive bilateral pulmonary metastatic disease. Radiographic appearance not significantly changed. No pneumothorax. HEART AND MEDIASTINUM: Aortic atherosclerosis. No acute abnormality of the cardiac and mediastinal silhouettes. BONES AND SOFT TISSUES: Right axillary surgical clips noted. No acute osseous abnormality. IMPRESSION: 1. Severe pulmonary metastatic disease. Ventilation not significantly changed. Electronically signed by: Helayne Hurst MD 08/10/2024 11:42 AM EST RP Workstation: [id number]     Procedures   Medications Ordered in the ED  sodium chloride  0.9 % bolus 250 mL (0 mLs Intravenous Stopping previously hung infusion 08/10/24 1807)  guaiFENesin -codeine  100-10 MG/5ML solution 5 mL (5 mLs Oral Given 08/10/24 1041)  iohexol  (OMNIPAQUE ) 350 MG/ML injection 75 mL (75 mLs Intravenous Contrast Given 08/10/24 1152)  lidocaine  2% Nebulized 2.5 mL (2.5 mLs  Nebulization Not Given 08/10/24 1606)  albuterol  (PROVENTIL ) (2.5 MG/3ML) 0.083% nebulizer solution 2.5 mg (2.5 mg Nebulization Given 08/10/24 1431)  oxyCODONE  (Oxy IR/ROXICODONE ) immediate release tablet 2.5 mg (2.5 mg Oral Given 08/10/24 1522)  heparin  lock flush 100 unit/mL (500 Units Intracatheter Given 08/10/24 1618)    Clinical Course as of 08/12/24 0909  Sun Aug 10, 2024  1137 WBC(!): 28.4 [RP]  1137 D-Dimer, Quant(!): 1.12 [RP]  1137 GFR, Estimated(!): 19 [RP]    Clinical Course User Index [RP] Yolande Lamar BROCKS, MD                                 Medical Decision Making Patient presenting for evaluation of persistent cough which has been present for weeks, has known metastatic nodules in her lungs.  She has had no fevers or chills, she does have a pleuritic component to her cough, cough is nonproductive.  She has tried multiple home medications including Mucinex , Tessalon , described what is probably Tussionex, without relief.  She was prescribed oxycodone  2.5 mg tablets by her oncologist yesterday but has been unable to get them filled as the pharmacy does not have this dose.  Differential diagnosis including complications from her known metastatic disease, pneumonia, bronchitis, PE.  She is significantly tachycardic and tachypneic, increased risk for PE, therefore D-dimer was ordered and is significantly elevated at 1.12.  She also has a significant WBC count at 28.4.  Imaging and labs as outlined below are reassuring for no acute changes in patient's medical condition.  She was given a trial of guaifenesin  with codeine  which was not effective.  She was next given an albuterol  and lidocaine  nebulized treatment which may have equivocally improved her symptoms, she was also given an oxycodone  2.5 mg tablet here.  By the time of her discharge her cough had significantly improved and she was ready to go home.  She does endorse having oxycodone  5 mg tablets at home from a prior  prescription, she was encouraged to break these tablets in half which should be the equivalent of the prescription she was trying to fill yesterday.  Advise close follow-up with her oncologist.  Amount and/or Complexity of Data Reviewed Labs: ordered. Decision-making details documented in ED Course.  Details: Labs reviewed, WBC count of 28.4, hemoglobin of 9.4 which is stable, her be met is significant for a creatinine of 2.72 consistent with her ESRD.  Of note she had completed dialysis prior to arrival here.  Her D-dimer is elevated at 1.12 Radiology: ordered.    Details: Chest x-ray confirming severe pulmonary metastatic disease.  CT angio negative for pneumonia and PE.  Risk OTC drugs. Prescription drug management.        Final diagnoses:  Persistent cough    ED Discharge Orders     None          Birdena Mliss RIGGERS 08/12/24 [id number]    Yolande Lamar BROCKS, MD 08/19/24 [phone]  "

## 2024-08-11 ENCOUNTER — Other Ambulatory Visit: Payer: Self-pay | Admitting: *Deleted

## 2024-08-11 ENCOUNTER — Encounter: Payer: Self-pay | Admitting: *Deleted

## 2024-08-11 DIAGNOSIS — R053 Chronic cough: Secondary | ICD-10-CM

## 2024-08-11 DIAGNOSIS — J90 Pleural effusion, not elsewhere classified: Secondary | ICD-10-CM

## 2024-08-11 MED ORDER — BENZONATATE 200 MG PO CAPS: 200.0000 mg | ORAL_CAPSULE | Freq: Two times a day (BID) | ORAL | 0 refills | Status: DC

## 2024-08-11 NOTE — Telephone Encounter (Signed)
 Per Dr. Davonna, will send in refill for tessalon  pearls to help with refractory cough.  Husband, Lynwood made aware and verbalized understanding.

## 2024-08-12 ENCOUNTER — Other Ambulatory Visit: Payer: Self-pay | Admitting: Oncology

## 2024-08-12 DIAGNOSIS — C50911 Malignant neoplasm of unspecified site of right female breast: Secondary | ICD-10-CM

## 2024-08-13 ENCOUNTER — Ambulatory Visit (HOSPITAL_COMMUNITY)
Admission: RE | Admit: 2024-08-13 | Discharge: 2024-08-13 | Disposition: A | Discharge status: Home / Self Care | Source: Ambulatory Visit | Attending: Oncology | Admitting: Oncology

## 2024-08-13 ENCOUNTER — Ambulatory Visit (HOSPITAL_COMMUNITY): Admission: RE | Admit: 2024-08-13 | Source: Ambulatory Visit

## 2024-08-13 ENCOUNTER — Encounter (HOSPITAL_COMMUNITY): Payer: Self-pay

## 2024-08-13 ENCOUNTER — Other Ambulatory Visit: Payer: Self-pay | Admitting: Oncology

## 2024-08-13 DIAGNOSIS — J90 Pleural effusion, not elsewhere classified: Secondary | ICD-10-CM | POA: Diagnosis not present

## 2024-08-13 DIAGNOSIS — I2510 Atherosclerotic heart disease of native coronary artery without angina pectoris: Secondary | ICD-10-CM | POA: Insufficient documentation

## 2024-08-13 DIAGNOSIS — Z992 Dependence on renal dialysis: Secondary | ICD-10-CM | POA: Insufficient documentation

## 2024-08-13 DIAGNOSIS — I700 Atherosclerosis of aorta: Secondary | ICD-10-CM | POA: Diagnosis not present

## 2024-08-13 DIAGNOSIS — N200 Calculus of kidney: Secondary | ICD-10-CM | POA: Insufficient documentation

## 2024-08-13 DIAGNOSIS — R590 Localized enlarged lymph nodes: Secondary | ICD-10-CM | POA: Insufficient documentation

## 2024-08-13 DIAGNOSIS — N186 End stage renal disease: Secondary | ICD-10-CM | POA: Insufficient documentation

## 2024-08-13 DIAGNOSIS — C787 Secondary malignant neoplasm of liver and intrahepatic bile duct: Secondary | ICD-10-CM | POA: Insufficient documentation

## 2024-08-13 DIAGNOSIS — C50911 Malignant neoplasm of unspecified site of right female breast: Secondary | ICD-10-CM | POA: Insufficient documentation

## 2024-08-13 DIAGNOSIS — C7800 Secondary malignant neoplasm of unspecified lung: Secondary | ICD-10-CM | POA: Diagnosis not present

## 2024-08-13 LAB — GLUCOSE, CAPILLARY: Glucose-Capillary: 65 mg/dL — ABNORMAL LOW (ref 70–99)

## 2024-08-13 MED ORDER — FLUDEOXYGLUCOSE F - 18 (FDG) INJECTION
7.3000 | Freq: Once | INTRAVENOUS | Status: AC
  Administered 2024-08-13: 7.68 via INTRAVENOUS

## 2024-08-13 MED ORDER — LIDOCAINE HCL (PF) 2 % IJ SOLN
INTRAMUSCULAR | Status: AC
  Filled 2024-08-13: qty 10

## 2024-08-13 NOTE — Progress Notes (Signed)
 Limited US  of the right and left chest shows no pleural fluid.  No procedure performed. Imaging results discussed with patient.  Images are available for review under imaging section of Epic.  Please call with questions or concerns.   Megan [name]

## 2024-08-18 ENCOUNTER — Inpatient Hospital Stay: Admitting: Dietician

## 2024-08-19 ENCOUNTER — Inpatient Hospital Stay (HOSPITAL_BASED_OUTPATIENT_CLINIC_OR_DEPARTMENT_OTHER): Admitting: Oncology

## 2024-08-19 ENCOUNTER — Inpatient Hospital Stay

## 2024-08-19 ENCOUNTER — Inpatient Hospital Stay: Attending: Hematology

## 2024-08-19 VITALS — BP 149/80 | HR 121 | Temp 96.2°F | Resp 20 | Wt 153.2 lb

## 2024-08-19 DIAGNOSIS — E876 Hypokalemia: Secondary | ICD-10-CM | POA: Insufficient documentation

## 2024-08-19 DIAGNOSIS — I700 Atherosclerosis of aorta: Secondary | ICD-10-CM | POA: Insufficient documentation

## 2024-08-19 DIAGNOSIS — D649 Anemia, unspecified: Secondary | ICD-10-CM

## 2024-08-19 DIAGNOSIS — C787 Secondary malignant neoplasm of liver and intrahepatic bile duct: Secondary | ICD-10-CM | POA: Insufficient documentation

## 2024-08-19 DIAGNOSIS — C50911 Malignant neoplasm of unspecified site of right female breast: Secondary | ICD-10-CM | POA: Diagnosis not present

## 2024-08-19 DIAGNOSIS — G629 Polyneuropathy, unspecified: Secondary | ICD-10-CM | POA: Insufficient documentation

## 2024-08-19 DIAGNOSIS — Z5112 Encounter for antineoplastic immunotherapy: Secondary | ICD-10-CM | POA: Insufficient documentation

## 2024-08-19 DIAGNOSIS — G6289 Other specified polyneuropathies: Secondary | ICD-10-CM | POA: Diagnosis not present

## 2024-08-19 DIAGNOSIS — R197 Diarrhea, unspecified: Secondary | ICD-10-CM | POA: Diagnosis not present

## 2024-08-19 DIAGNOSIS — R5383 Other fatigue: Secondary | ICD-10-CM | POA: Insufficient documentation

## 2024-08-19 DIAGNOSIS — D509 Iron deficiency anemia, unspecified: Secondary | ICD-10-CM | POA: Insufficient documentation

## 2024-08-19 DIAGNOSIS — J90 Pleural effusion, not elsewhere classified: Secondary | ICD-10-CM | POA: Insufficient documentation

## 2024-08-19 DIAGNOSIS — Z7982 Long term (current) use of aspirin: Secondary | ICD-10-CM | POA: Insufficient documentation

## 2024-08-19 DIAGNOSIS — Z17411 Hormone receptor positive with human epidermal growth factor receptor 2 negative status: Secondary | ICD-10-CM | POA: Insufficient documentation

## 2024-08-19 DIAGNOSIS — Z9221 Personal history of antineoplastic chemotherapy: Secondary | ICD-10-CM | POA: Insufficient documentation

## 2024-08-19 DIAGNOSIS — Z992 Dependence on renal dialysis: Secondary | ICD-10-CM | POA: Insufficient documentation

## 2024-08-19 DIAGNOSIS — I2510 Atherosclerotic heart disease of native coronary artery without angina pectoris: Secondary | ICD-10-CM | POA: Insufficient documentation

## 2024-08-19 DIAGNOSIS — R634 Abnormal weight loss: Secondary | ICD-10-CM | POA: Insufficient documentation

## 2024-08-19 DIAGNOSIS — Z923 Personal history of irradiation: Secondary | ICD-10-CM | POA: Insufficient documentation

## 2024-08-19 DIAGNOSIS — R053 Chronic cough: Secondary | ICD-10-CM | POA: Diagnosis not present

## 2024-08-19 DIAGNOSIS — Z7952 Long term (current) use of systemic steroids: Secondary | ICD-10-CM | POA: Insufficient documentation

## 2024-08-19 DIAGNOSIS — N200 Calculus of kidney: Secondary | ICD-10-CM | POA: Insufficient documentation

## 2024-08-19 DIAGNOSIS — Z9011 Acquired absence of right breast and nipple: Secondary | ICD-10-CM | POA: Insufficient documentation

## 2024-08-19 DIAGNOSIS — Z79899 Other long term (current) drug therapy: Secondary | ICD-10-CM | POA: Insufficient documentation

## 2024-08-19 DIAGNOSIS — N186 End stage renal disease: Secondary | ICD-10-CM | POA: Insufficient documentation

## 2024-08-19 DIAGNOSIS — C7800 Secondary malignant neoplasm of unspecified lung: Secondary | ICD-10-CM | POA: Insufficient documentation

## 2024-08-19 LAB — CBC WITH DIFFERENTIAL/PLATELET
Abs Immature Granulocytes: 0.14 K/uL — ABNORMAL HIGH (ref 0.00–0.07)
Basophils Absolute: 0.1 K/uL (ref 0.0–0.1)
Basophils Relative: 1 %
Eosinophils Absolute: 0.2 K/uL (ref 0.0–0.5)
Eosinophils Relative: 1 %
HCT: 32.7 % — ABNORMAL LOW (ref 36.0–46.0)
Hemoglobin: 9.9 g/dL — ABNORMAL LOW (ref 12.0–15.0)
Immature Granulocytes: 1 %
Lymphocytes Relative: 6 %
Lymphs Abs: 0.8 K/uL (ref 0.7–4.0)
MCH: 35.1 pg — ABNORMAL HIGH (ref 26.0–34.0)
MCHC: 30.3 g/dL (ref 30.0–36.0)
MCV: 116 fL — ABNORMAL HIGH (ref 80.0–100.0)
Monocytes Absolute: 1.1 K/uL — ABNORMAL HIGH (ref 0.1–1.0)
Monocytes Relative: 8 %
Neutro Abs: 11.5 K/uL — ABNORMAL HIGH (ref 1.7–7.7)
Neutrophils Relative %: 83 %
Platelets: 397 K/uL (ref 150–400)
RBC: 2.82 MIL/uL — ABNORMAL LOW (ref 3.87–5.11)
RDW: 21.4 % — ABNORMAL HIGH (ref 11.5–15.5)
Smear Review: NORMAL
WBC: 13.8 K/uL — ABNORMAL HIGH (ref 4.0–10.5)
nRBC: 0 % (ref 0.0–0.2)

## 2024-08-19 LAB — COMPREHENSIVE METABOLIC PANEL WITH GFR
ALT: 11 U/L (ref 0–44)
AST: 22 U/L (ref 15–41)
Albumin: 3.1 g/dL — ABNORMAL LOW (ref 3.5–5.0)
Alkaline Phosphatase: 145 U/L — ABNORMAL HIGH (ref 38–126)
Anion gap: 16 — ABNORMAL HIGH (ref 5–15)
BUN: 17 mg/dL (ref 8–23)
CO2: 28 mmol/L (ref 22–32)
Calcium: 8.9 mg/dL (ref 8.9–10.3)
Chloride: 96 mmol/L — ABNORMAL LOW (ref 98–111)
Creatinine, Ser: 5.49 mg/dL — ABNORMAL HIGH (ref 0.44–1.00)
GFR, Estimated: 8 mL/min — ABNORMAL LOW
Glucose, Bld: 96 mg/dL (ref 70–99)
Potassium: 3.4 mmol/L — ABNORMAL LOW (ref 3.5–5.1)
Sodium: 140 mmol/L (ref 135–145)
Total Bilirubin: 0.3 mg/dL (ref 0.0–1.2)
Total Protein: 6 g/dL — ABNORMAL LOW (ref 6.5–8.1)

## 2024-08-19 LAB — MAGNESIUM: Magnesium: 2.1 mg/dL (ref 1.7–2.4)

## 2024-08-19 LAB — SAMPLE TO BLOOD BANK

## 2024-08-19 LAB — PHOSPHORUS: Phosphorus: 3.7 mg/dL (ref 2.5–4.6)

## 2024-08-19 MED ORDER — MIDODRINE HCL 2.5 MG PO TABS: 2.5000 mg | ORAL_TABLET | ORAL | 2 refills | Status: AC

## 2024-08-19 NOTE — Progress Notes (Signed)
 " Patient Care Team: Megan Butler DASEN, MD as PCP - General (Family Medicine) Alvan, Megan FALCON, MD as PCP - Cardiology (Cardiology) Megan Lamar HERO, MD (Gastroenterology) Megan Jayson MATSU, MD as Consulting Physician (Cardiology) Megan Pac, MD as Consulting Physician (Nephrology)  Clinic Day:  08/19/2024  Referring physician: Duanne Butler DASEN, MD   CHIEF COMPLAINT:  CC: Stage Ib (T1CN0) right breast cancer, ER positive, PR and HER-2 negative    ASSESSMENT & PLAN:   Assessment & Plan: Megan [name]  is a 64 y.o. 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  Diagnosed in 2019. S/p right breast lumpectomy, Oncotype DX score of 47.  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  Also had prophylactic left simple mastectomy. First recurrence: 07/13/2020.  Local recurrence, s/p right breast lumpectomy. Second occurrence: 10/12/2020.  Local recurrence.  S/p right mastectomy and radiation therapy.Patient decided against chemotherapy considering poor tolerance with the first time and bad peripheral neuropathy. Third recurrence: 11/30/2022: Within the right medial pectoralis musculature-local recurrence.  S/p right chest wall mass resection consistent with invasive ductal carcinoma.  Triple negative. S/p RT.  6 cycles of carboplatin /docetaxel  and pembrolizumab . PET scan in 01/2024 showed abnormal lymph nodes in the left axillary region.  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 Triple negative.  Germline mutation analysis: Negative.  [facility] NGS: PD-L1: 10, no other targetable mutations Started on capecitabine  and pembrolizumab .  Patient completed 1 cycle and was admitted to the hospital for  shortness of breath.  CT scan at this time showed progression of disease with lung metastasis and liver metastasis. 05/29/2024: Started on Trodelvy .   -We reviewed the PET scan findings together today.  Patient has mixed response with stable pulmonary disease and improved response and mediastinal lymph nodes and hepatic lobe metastasis but also has new right adrenal hypermetabolism.  She also has new focal osseous hypermetabolism, suggesting disease progression. - Discussed addition of pembrolizumab  to Trodelvy  at this time.  Patient received pembrolizumab  before and tolerated well.  Patient is tolerating current doses of Trodelvy  well. -Patient reports fatigue today and does not want to get treatment.  Will hold treatment today and reschedule to next week along with pembrolizumab . - Labs reviewed today: CMP: Potassium: 3.4, creatinine: 5.49, alkaline phosphatase: 145, AST: 22, ALT: 11.  CBC: Hemoglobin: 9.9, WBC: 13.8, platelets: 397. -Physical exam stable today.   - Will repeat a PET scan after completion of 3 cycles of chemoimmunotherapy .  Return to clinic prior to next cycle to assess for tolerance   Diarrhea Patient reports significant improvement in diarrhea with lower dose of Sacituzumab Govitecan .  Reports Lomotil  being effective.  - Use Imodium  and Lomotil  as needed.  Hypokalemia Likely secondary to diarrhea.  EKG shows prolonged QTc but improved from prior.  - Will replace per protocol.  Cough due to lung metastases Persistent cough likely from lung metastases. No antibiotics needed at this time.  Patient required admission to the hospital for bronchitis previously. Reports improvement with Percocet and over-the-counter cough syrup  -Continue Percocet and over-the-counter cough syrup as prescribed.  Increase Percocet to 5 mg at night to help with sleep by decreasing cough. - Continue Tessalon  perles for cough management. - Provideded refills for Tessalon  perles as needed. -  Continue oxygen  support  Peripheral neuropathy Existing peripheral neuropathy may worsen with new treatment.    -Recommended to report any symptom changes. - Monitor for worsening neuropathy symptoms. - Instructed her to report any increase in numbness or tingling.  Anemia Anemia with low hemoglobin.  Likely multifactorial from ESRD,myelosuppression.  Anemia panel with elevated ferritin last time  - Monitor hemoglobin levels closely.  End-stage renal disease on dialysis End-stage renal disease managed with dialysis.   - Continue regular dialysis sessions. - Monitor kidney function closely.  Weight loss Patient has lost about 20 pounds in the past few months. Stable weight at this time  - Continue to monitor.   The patient understands the plans discussed today and is in agreement with them.  She knows to contact our office if she develops concerns prior to her next appointment.  The total time spent in the appointment was 30 minutes for the encounter  with patient, including review of chart and various tests results, discussions about plan of care and coordination of care plan.   Megan [name],acting as a neurosurgeon for Megan Dry, MD.,have documented all relevant documentation on the behalf of Megan Dry, MD,as directed by  Megan Dry, MD while in the presence of Megan Dry, MD.  I, Megan Dry MD, have reviewed the above documentation for accuracy and completeness, and I agree with the above.     Megan Dry, MD  [name] - A DEPT OF [name] [address]  [place] [address] Dept: [phone] Dept Fax: [phone]   No orders of the defined types were placed in this encounter.    ONCOLOGY HISTORY:   I have reviewed her chart and materials related to her cancer extensively and collaborated history with the patient. Summary of oncologic history is as  follows:   Diagnosis: Stage Ib (T1CN0) right breast cancer, ER positive, PR and HER-2 negative   -02/18/2018: Screening Mammogram: possible mass in the right breast. -02/26/2018: Diagnostic Mammogram and US  of right breast: Several small 4-5 mm cysts, with a bilobed cyst at the 1 o'clock position 6 cm from nipple measuring 0.5 x 0.3 x 0.4 cm. Irregular shadowing mass in the right breast at the 12:30 position 6 cm from nipple measuring 0.7 x 0.8 x 0.6 cm. -03/05/2018: Right breast mass biopsy.  Pathology: Benign breast tissue with areas of dense hyaline fibrosis. No evidence of malignancy.  -08/20/2018: Diagnostic Right breast Mammogram: Suspicious mass in the 1 o'clock location of the RIGHT breast.  -08/27/2018: Right breast mass biopsy.  Pathology: Invasive ductal carcinoma. Based on the biopsy, the carcinoma appears Nottingham grade 3 of 3 and measures 0.5 cm in greatest linear extent. The tumor cells are ER positive (50%), PR negative (0%), and Her2 negative (2+). Ki67 60%. -09/18/2018: Right lumpectomy and sentinel lymph node biopsy.  Pathology: Invasive ductal carcinoma, grade 3, spanning 1.5 cm. High grade ductal carcinoma in situ with necrosis. Invasive carcinoma comes to within 0.1-0.2 cm of the anterior margin focally. Three of three lymph nodes negative for metastatic carcinoma.  Oncotype DX: 47 -10/30/2018 to 01/01/2019: 4 cycles of adjuvant AC -01/2019- 10/2023: Anastrozole  -09/02/2019: Mammogram: 8 mm group of indeterminate calcifications in the 3 o'clock position of the right breast. -09/12/2019: Right breast biopsy.  Pathology: DCIS, high nuclear grade with central necrosis and calcifications.  -02/18/2020: Right partial mastectomy. Pathology: Ductal carcinoma in situ, high-grade, with central necrosis and calcifications. No evidence of invasive carcinoma.  Resection margins are negative for DCIS. Eleven lymph nodes, negative for carcinoma (0/11). -06/15/2020: Prophylactic Left  simple mastectomy with right breast bisopy.  Pathology of left breast: Benign breast parenchyma with mild fibrocystic change.Negative for carcinoma. Pathology of right breast biopsy: Microscopic focus of invasive ductal carcinoma in a background of extensive high-grade ductal carcinoma in situ with necrosis and rare calcifications measuring 0.1 cm in greatest linear dimension.  -07/13/2020: Right breast lumpectomy.  Pathology: Multifocal invasive ductal carcinoma, grade 3, largest spanning 1.2 cm. Extensive high grade ductal carcinoma in situ with necrosis. Invasive carcinoma is 1-2 mm from the medial margin focally and 2 mm from the lateral margin focally.  ER/PR/HER-2 is negative.  Ki-67 is 20%.  -09/14/2020: PET: Areas of nodularity about the RIGHT breast in this patient who is undergone previous lumpectomy x2 and RIGHT mastectomy. Most suspicious area is associated with discrete nodule in the fat of the inferior chest wall within the subcutaneous tissues. Scattered small areas along the pectoralis musculature tracking laterally largest on image 56 of series 4 also associated with low level uptake. Small pulmonary nodules without interval change. -09/28/2020: US  of right chest wall: Mass-like abnormality in the upper inner aspect of the right anterior chest, designated at 1 o'clock, 7 cm from the central aspect of the mastectomy scar. This measures 4.1 cm in greatest dimension. 1.7 cm hypoechoic mass the central anterior right chest deep to the mid aspect of the mastectomy scar. Possible borderline enlarged residual right axillary lymph node.  -10/12/2020: Right breast biopsy.  Pathology: Invasive and in situ ductal carcinoma, 1.3 x 1.1 x 0.8 cm. Invasive carcinoma focally 0.1 cm from superior margin. DCIS less than 0.1 cm from superior margin.  -10/28/2020: [facility] results: Negative. -12/2020: Right mastectomy followed by radiation therapy completed. Patient decided against chemotherapy considering  poor tolerance with the first time and bad peripheral neuropathy. -11/30/2022: PET: Recurrent/metastatic disease within the right medial pectoralis musculature in this patient who is status post bilateral mastectomy. A right suprarenal 8 mm nodule is likely arising from the adrenal gland. -12/19/2022: Right chest wall mass biopsy.  Pathology: Metastatic adenocarcinoma. Tumor cells are ER positive (40%), PR negative (0%), and Her2 negative (0). Ki-67 30%. -01/18/2023: Right chest wall mass resection with right breast biopsy. -Pathology of right chest wall: Invasive ductal carcinoma (clinically recurrent), 1.7 cm, grade 3. Ductal carcinoma in situ, high-grade with necrosis. Carcinoma involves the inked inferior margin. Carcinoma focally invades into atrophic skeletal muscle. -Pathology of right breast biopsy: Grade 3 Invasive ductal carcinoma. Lymphovascular invasion not identified. Tumor cells ER negative (0%), PR negative (0%), and Her negative (0). Ki-67 35%. -04/18/2023: Patient's case discussed at multidisciplinary clinic at [facility]. Surgical oncology recommended radiation therapy. Patient also evaluated by medical oncology who recommended systemic therapy with 6 cycles of docetaxel , carboplatin  and pembrolizumab .  -05/07/2023 to 05/25/2023: XRT -06/14/2023 to 09/27/2023:  6 cycles of carboplatin , docetaxel  and pembrolizumab   -01/31/2024: PET: multiple abnormal lymph nodes identified in the left axillary region and chest wall which are hypermetabolic there is also a node in the supraclavicular region on the left. These are concerning for worsening metastatic disease. -02/26/2024: Left axillary lymph node biopsy.  -Pathology: Metastatic carcinoma involving a lymph node, compatible with patient's clinical history of primary breast carcinoma. All prognostic markers negative (0). Ki-67 20%.  -[facility] NGS: PDL1: Positive, CPS: 10, ER/PR/HER2/NEU: Triple negative, HER2: Negative, MSI-stable, BRAF,  NTRK 1/2/3, RET: Negative, genomic LOH: Low, TMB: Low 47mut/Mb, ESR 1: Not detected, PALB2: Not detected  PIK3CA: Not detected -03/03/2024: CT AP w contrast: New right lower lobe nodules worrisome for metastatic disease. -03/12/2024: BRCA1/2 analysis: Germline mutation analysis: NTHL1- Variant of unknown significance -03/26/2024: Patient was seen by Dr. [name] coons oncologist] at [name], who recommended systemic treatment with pembrolizumab  and significantly dose-reduced capecitabine  (500 mg BID). If disease progression occurs, next line of treatment may be dose-adjusted sacituzumab govitecan  + pembrolizumab  (Considering poor data for either in patients with ESRD  -04/08/2024: CEA 15-3: 67, CA 27-29: 98.7 -04/17/2024-05/08/2024:  Capecitabine (dose reduced ) + pembrolizumab  -05/22/2024: PET Scan: Marked progression of metastatic breast cancer as evidenced by widespread pulmonary metastatic disease, progressive low neck, chest and upper abdominal adenopathy, new liver metastasis and new intercostal metastasis. - 05/29/2024-current: Sacituzumab govitecan  every 3 weeks. 2nd cycle delayed due to hospitalization and also had severe diarrhea during this period.  -07/01/2024: Dose reduced to 5 mg/kg on day 1 and 8 of a 21-day cycle. -07/20/2024: Chest x-ray: Stable diffuse pulmonary metastases.  -08/13/2024: PET: Mixed response to therapy. Pulmonary metastatic disease appears grossly stable. Slight decrease in FDG avidity within mediastinal lymph nodes and right hepatic lobe metastasis indicates improvement. New right adrenal hypermetabolism, indicative of a metastasis. New focal osseous hypermetabolism, suggesting disease progression. Healing cannot be excluded. Increasing focal distal gastric hypermetabolism, difficult to exclude metastatic disease. Stable hypermetabolic retrocaval lymph node. New intense uptake extending from the left styloid process, along the left tongue and left floor of mouth,  without a CT correlate.   Current Treatment:  Sacituzumab govitecan  every 3 weeks.  INTERVAL HISTORY:   Megan [name] is a 64 y.o. female presenting to the clinic today for follow-up of ER positive right breast cancer. She is accompanied by her husband and sister today.   She reports feeling tired today. Her cough did not significantly improve with Tessalon , cough syrup, or percocet. She will wake up coughing during the night and her cough is worsened when talking. We discussed increasing percocet dosage to 5 mg on the nights she is not having dialysis the next day. She notes upper chest pain due to excessive coughing. We will postpone treatment today and reschedule for next week for fatigue.  We discussed her PET results, which showed a mixed response to treatment. Megan [name] is agreeable to adding Keytruda  to the regimen.   I have reviewed the past medical history, past surgical history, social history and family history with the patient and they are unchanged from previous note.  ALLERGIES:  is allergic to motrin [ibuprofen] and trodelvy  [sacituzumab govitecan -hziy].  MEDICATIONS:  Current Outpatient Medications  Medication Sig Dispense Refill   acetaminophen  (TYLENOL ) 500 MG tablet Take 1,000 mg by mouth every 8 (eight) hours as needed for moderate pain (pain score 4-6).     albuterol  (PROVENTIL ) (2.5 MG/3ML) 0.083% nebulizer solution Take 3 mLs (2.5 mg total) by nebulization every 4 (four) hours as needed for wheezing or shortness of breath. 75 mL 2   albuterol  (VENTOLIN  HFA) 108 (90 Base) MCG/ACT inhaler Inhale 2 puffs into the lungs every 6 (six) hours as needed for wheezing or shortness of breath. 8 g 2   aspirin  EC 81 MG tablet Take 81 mg by mouth daily.     azithromycin  (ZITHROMAX ) 500 MG tablet Take 1 tablet (500 mg total) by mouth daily. 3 tablet 0   B Complex-C-Zn-Folic Acid  (DIALYVITE  800-ZINC  15) 0.8 MG TABS Take 1 tablet by mouth daily.     benzonatate  (TESSALON ) 200 MG  capsule Take 1 capsule (200 mg  total) by mouth 2 (two) times daily. 20 capsule 0   cefdinir  (OMNICEF ) 300 MG capsule Take 1 capsule (300 mg total) by mouth daily. 3 capsule 0   cinacalcet  (SENSIPAR ) 90 MG tablet Take 90 mg by mouth daily.     Darbepoetin Alfa  (ARANESP , ALBUMIN  FREE, IJ) Inject 200 mcg into the vein every Friday.     diphenoxylate -atropine  (LOMOTIL ) 2.5-0.025 MG tablet Take 1 tablet by mouth 4 (four) times daily as needed for diarrhea or loose stools. 120 tablet 0   docusate sodium  (COLACE) 100 MG capsule Take 1 capsule (100 mg total) by mouth 2 (two) times daily as needed for mild constipation (Constipation related to opioid medication). 60 capsule 0   gabapentin  (NEURONTIN ) 300 MG capsule TAKE 1 CAPSULE(300 MG) BY MOUTH AT BEDTIME 90 capsule 0   lanthanum  (FOSRENOL ) 1000 MG chewable tablet Chew 2,000-3,000 mg by mouth See admin instructions. Take 3 tablets (3000 mg) by mouth with meals and take 2 tablets (2000 mg) with snacks     lidocaine -prilocaine  (EMLA ) cream Apply to affected area once (Patient taking differently: Apply 1 Application topically every Monday, Wednesday, and Friday with hemodialysis. Apply to affected area once) 30 g 3   midodrine  (PROAMATINE ) 2.5 MG tablet Take 1 tablet (2.5 mg total) by mouth See admin instructions. Monday,Wednesday and Friday Only take on dialysis days 30 tablet 2   Nebulizers (COMPRESSOR/NEBULIZER) MISC 1 Units by Does not apply route daily as needed. 1 each 0   nystatin  (MYCOSTATIN ) 100000 UNIT/ML suspension Take 5 mLs (500,000 Units total) by mouth 4 (four) times daily. Swish and swallow 473 mL 1   ondansetron  (ZOFRAN ) 8 MG tablet Take 1 tablet (8 mg total) by mouth every 8 (eight) hours as needed for nausea or vomiting. Start on the third day after chemotherapy. 30 tablet 1   oxycodone -acetaminophen  (PERCOCET) 2.5-325 MG tablet Take 1 tablet by mouth at bedtime as needed (Severe refractory cough). 30 tablet 0   pantoprazole  (PROTONIX ) 40 MG  tablet Take 1 tablet (40 mg total) by mouth 2 (two) times daily before a meal. 60 tablet 11   predniSONE  (DELTASONE ) 10 MG tablet Take 5 tablets (50 mg total) by mouth daily with breakfast. And decrease by one tablet daily 15 tablet 0   No current facility-administered medications for this visit.    VITALS:  There were no vitals taken for this visit.  Wt Readings from Last 3 Encounters:  08/19/24 153 lb 3.5 oz (69.5 kg)  08/10/24 156 lb 4.9 oz (70.9 kg)  08/04/24 156 lb 12.8 oz (71.1 kg)    There is no height or weight on file to calculate BMI.  Performance status (ECOG): 0 - Asymptomatic  PHYSICAL EXAM:   GENERAL:alert, no distress and comfortable SKIN: skin color, texture, turgor are normal, no rashes or significant lesions HEENT: Oral thrush present on her posterior side of the cheeks EYES: normal, Conjunctiva are pink and non-injected, sclera clear LUNGS: clear to auscultation and percussion with normal breathing effort on oxygen  via nasal cannula HEART: regular rate & rhythm and no murmurs and no lower extremity edema ABDOMEN:abdomen soft, non-tender and normal bowel sounds Musculoskeletal:no cyanosis of digits and no clubbing  NEURO: alert & oriented x 3 with fluent speech   LABORATORY DATA:  I have reviewed the data as listed  Lab Results  Component Value Date   WBC 13.8 (H) 08/19/2024   HGB 9.9 (L) 08/19/2024   HCT 32.7 (L) 08/19/2024   MCV 116.0 (H) 08/19/2024  PLT 397 08/19/2024      Chemistry      Component Value Date/Time   NA 140 08/19/2024 0839   K 3.4 (L) 08/19/2024 0839   CL 96 (L) 08/19/2024 0839   CO2 28 08/19/2024 0839   BUN 17 08/19/2024 0839   CREATININE 5.49 (H) 08/19/2024 0839   CREATININE 9.08 (H) 01/10/2024 1113      Component Value Date/Time   CALCIUM  8.9 08/19/2024 0839   ALKPHOS 145 (H) 08/19/2024 0839   AST 22 08/19/2024 0839   ALT 11 08/19/2024 0839   BILITOT 0.3 08/19/2024 0839       RADIOGRAPHIC STUDIES: I have personally  reviewed the radiological images as listed and agreed with the findings in the report.  NM PET Image Restag (PS) Skull Base To Thigh CLINICAL DATA:  Subsequent treatment strategy for breast cancer.  EXAM: NUCLEAR MEDICINE PET SKULL BASE TO THIGH  TECHNIQUE: 7.7 mCi F-18 FDG was injected intravenously. Full-ring PET imaging was performed from the skull base to thigh after the radiotracer. CT data was obtained and used for attenuation correction and anatomic localization.  Fasting blood glucose: 65 mg/dl  COMPARISON:  CT chest 08/10/2024, CT abdomen pelvis 06/08/2024 and PET 05/22/2024.  FINDINGS: Mediastinal blood pool activity: SUV max 2.5  Liver activity: SUV max NA  NECK:  Slight misregistration artifact. New intense uptake extending from the left styloid process, along the left tongue and left floor of mouth, without a CT correlate. Otherwise, no hypermetabolic lymph nodes.  Incidental CT findings:  None.  CHEST:  Right IJ Port-A-Cath terminates near the SVC RA junction. Atherosclerotic calcification of the aorta, aortic valve and coronary arteries. Heart is enlarged. No pericardial effusion. Small right pleural effusion.  Incidental CT findings:  Right IJ Port-A-Cath terminates near the SVC RA junction. Atherosclerotic calcification of the aorta, aortic valve and coronary arteries. Heart is enlarged. No pericardial effusion. Small right pleural effusion.  ABDOMEN/PELVIS:  Posterior right hepatic lobe metastasis has decreased slightly in hypermetabolism, now SUV max 9.3, previously 12.0. Focal right adrenal hypermetabolism, SUV max 5.5, new. Increasing distal gastric focal hypermetabolism, 6.7, previously 5.6. Hypermetabolic retrocaval lymph node measures 5 mm (4/101), SUV max 4.4, previously 7.5.  Incidental CT findings:  Cholecystectomy. Atrophic kidneys. Small low and high density lesions in the kidneys are too small to characterize. No  specific follow-up necessary. Bilateral renal stones.  SKELETON:  Diffuse patchy hypermetabolism with areas of new focality in the lumbar spine, right sacrum and right iliac wing. Low right intercostal metastasis has decreased in hypermetabolism.  Incidental CT findings:  Degenerative changes in the spine.  IMPRESSION: 1. Mixed response to therapy. Pulmonary metastatic disease appears grossly stable. Slight decrease in FDG avidity within mediastinal lymph nodes and right hepatic lobe metastasis indicates improvement. New right adrenal hypermetabolism, indicative of a metastasis. New focal osseous hypermetabolism, suggesting disease progression. Healing cannot be excluded. 2. Increasing focal distal gastric hypermetabolism, difficult to exclude metastatic disease. 3. Stable hypermetabolic retrocaval lymph node. 4. New intense uptake extending from the left styloid process, along the left tongue and left floor of mouth, without a CT correlate. This could be infectious/inflammatory in etiology and should be amenable to direct visual inspection. 5. Small right pleural effusion. 6. Bilateral renal stones. 7. Aortic atherosclerosis (ICD10-I70.0). Coronary artery calcification.  Electronically Signed   By: Newell Eke M.D.   On: 08/18/2024 14:10  "

## 2024-08-19 NOTE — Progress Notes (Signed)
 We will hold treatment today and patient will return next week for treatment per Dr.Kandala.

## 2024-08-19 NOTE — Patient Instructions (Addendum)
 [facility] at [facility] Discharge Instructions   You were seen and examined today by Dr. Davonna.  She reviewed the results of your lab work which are normal/stable.   She reviewed the results of your PET scan which shows a mostly good/stable response to treatment. There are some areas that appear brighter on the scan and some new areas on the bones too. We will continue treatment but add Keytruda  to your next treatment.   Continue oxycodone  2.5 with robitussin. Take oxycodone  5 mg at bedtime on the nights of dialysis. Take oxycodone  2.5 mg with the robitussin on the nights before dialysis.   We will hold your treatment today.   Return as scheduled.    Thank you for choosing [facility] at [facility] to provide your oncology and hematology care.  To afford each patient quality time with our provider, please arrive at least 15 minutes before your scheduled appointment time.   If you have a lab appointment with the Cancer Center please come in thru the Main Entrance and check in at the main information desk.  You need to re-schedule your appointment should you arrive 10 or more minutes late.  We strive to give you quality time with our providers, and arriving late affects you and other patients whose appointments are after yours.  Also, if you no show three or more times for appointments you may be dismissed from the clinic at the providers discretion.     Again, thank you for choosing [facility].  Our hope is that these requests will decrease the amount of time that you wait before being seen by our physicians.       _____________________________________________________________  Should you have questions after your visit to [facility], please contact our office at [phone] and follow the prompts.  Our office hours are 8:00 a.m. and 4:30 p.m. Monday - Friday.  Please note that voicemails left after 4:00 p.m. may  not be returned until the following business day.  We are closed weekends and major holidays.  You do have access to a nurse 24-7, just call the main number to the clinic [phone] and do not press any options, hold on the line and a nurse will answer the phone.    For prescription refill requests, have your pharmacy contact our office and allow 72 hours.    Due to Covid, you will need to wear a mask upon entering the hospital. If you do not have a mask, a mask will be given to you at the Main Entrance upon arrival. For doctor visits, patients may have 1 support person age 48 or older with them. For treatment visits, patients can not have anyone with them due to social distancing guidelines and our immunocompromised population.

## 2024-08-20 LAB — CANCER ANTIGEN 15-3: CA 15-3: 190 U/mL — ABNORMAL HIGH (ref 0.0–25.0)

## 2024-08-20 LAB — CANCER ANTIGEN 27.29: CA 27.29: 248.8 U/mL — ABNORMAL HIGH (ref 0.0–38.6)

## 2024-08-21 ENCOUNTER — Other Ambulatory Visit: Payer: Self-pay | Admitting: Oncology

## 2024-08-21 DIAGNOSIS — C50911 Malignant neoplasm of unspecified site of right female breast: Secondary | ICD-10-CM

## 2024-08-22 NOTE — Progress Notes (Unsigned)
 Dr Davonna is adding Keytruda  to be given with Trodelvy .  Dose of Keytruda  is 200 mg IVPB q 3 weeks on day 1 of chemotherapy plan.  Based on the below trial:  ASCENT-04 (also known as KEYNOTE-D19) was a phase 3 randomized trial comparing sacituzumab govitecan  plus pembrolizumab  versus chemotherapy plus pembrolizumab  in previously untreated, PD-L1-positive (CPS =10) locally advanced unresectable or metastatic triple-negative breast cancer.    Study Design and Patient Population   The trial enrolled 443 patients randomized 1:1 to receive either sacituzumab govitecan  (10 mg/kg IV on days 1 and 8) plus pembrolizumab  (200 mg on day 1, maximum 35 cycles) in 21-day cycles, or chemotherapy (gemcitabine plus carboplatin , paclitaxel, or nab-paclitaxel) plus pembrolizumab  until disease progression or unacceptable toxicity.   Randomization was stratified by curative treatment-free interval, geography, and prior exposure to anti-PD-(L)1 therapy in the curative setting.   Patients were required to have PD-L1-positive tumors defined as CPS =10 using the 22C3 assay.    Primary and Secondary Endpoints   The primary endpoint was progression-free survival by blinded independent central review.  Key secondary endpoints included overall survival, objective response rate, duration of response by blinded independent central review, and safety.    Efficacy Results   With a median follow-up of 14 months, sacituzumab govitecan  plus pembrolizumab  demonstrated statistically significant and clinically meaningful improvement in progression-free survival compared with chemotherapy plus pembrolizumab  (HR 0.65; 95% CI, 0.51-0.84; P=0.0009).  The objective response rate was 59.7% with sacituzumab govitecan  plus pembrolizumab  versus 53.2% with chemotherapy plus pembrolizumab .  Importantly, the median duration of response was substantially longer with sacituzumab govitecan  plus pembrolizumab  at 16.5 months compared to 9.2  months with chemotherapy plus pembrolizumab .  Although overall survival data were immature at the time of primary analysis, a positive early trend in overall survival improvement was noted.    Safety Profile   The most frequent grade =3 treatment-emergent adverse events with sacituzumab govitecan  plus pembrolizumab  were neutropenia (43%) and diarrhea (10%), while with chemotherapy plus pembrolizumab  they were neutropenia (45%), anemia (16%), and thrombocytopenia (14%).   Notably, sacituzumab govitecan  plus pembrolizumab  had a lower rate of dose reduction (35% vs 44%) and treatment discontinuation (12% vs 31%) compared to chemotherapy plus pembrolizumab .   No new safety concerns were identified for either sacituzumab govitecan  or pembrolizumab .    These results support sacituzumab govitecan  plus pembrolizumab  as a potential new standard of care treatment for previously untreated, PD-L1-positive advanced triple-negative breast cancer.   Plan updated and patient assistance team notified to work with patient to determine if any assistance is available.  PA team will assess payment as well.  Niels Molt, PharmD

## 2024-08-26 ENCOUNTER — Inpatient Hospital Stay

## 2024-08-26 VITALS — BP 149/87 | HR 120 | Temp 97.9°F | Resp 18

## 2024-08-26 DIAGNOSIS — E876 Hypokalemia: Secondary | ICD-10-CM

## 2024-08-26 DIAGNOSIS — C50911 Malignant neoplasm of unspecified site of right female breast: Secondary | ICD-10-CM

## 2024-08-26 LAB — COMPREHENSIVE METABOLIC PANEL WITH GFR
ALT: 12 U/L (ref 0–44)
AST: 25 U/L (ref 15–41)
Albumin: 3.1 g/dL — ABNORMAL LOW (ref 3.5–5.0)
Alkaline Phosphatase: 120 U/L (ref 38–126)
Anion gap: 17 — ABNORMAL HIGH (ref 5–15)
BUN: 21 mg/dL (ref 8–23)
CO2: 27 mmol/L (ref 22–32)
Calcium: 9.8 mg/dL (ref 8.9–10.3)
Chloride: 96 mmol/L — ABNORMAL LOW (ref 98–111)
Creatinine, Ser: 5.18 mg/dL — ABNORMAL HIGH (ref 0.44–1.00)
GFR, Estimated: 9 mL/min — ABNORMAL LOW
Glucose, Bld: 116 mg/dL — ABNORMAL HIGH (ref 70–99)
Potassium: 3.1 mmol/L — ABNORMAL LOW (ref 3.5–5.1)
Sodium: 140 mmol/L (ref 135–145)
Total Bilirubin: 0.3 mg/dL (ref 0.0–1.2)
Total Protein: 6.3 g/dL — ABNORMAL LOW (ref 6.5–8.1)

## 2024-08-26 LAB — CBC WITH DIFFERENTIAL/PLATELET
Abs Immature Granulocytes: 0.14 K/uL — ABNORMAL HIGH (ref 0.00–0.07)
Basophils Absolute: 0.1 K/uL (ref 0.0–0.1)
Basophils Relative: 1 %
Eosinophils Absolute: 0.2 K/uL (ref 0.0–0.5)
Eosinophils Relative: 2 %
HCT: 33.8 % — ABNORMAL LOW (ref 36.0–46.0)
Hemoglobin: 10.3 g/dL — ABNORMAL LOW (ref 12.0–15.0)
Immature Granulocytes: 1 %
Lymphocytes Relative: 8 %
Lymphs Abs: 0.9 K/uL (ref 0.7–4.0)
MCH: 35.2 pg — ABNORMAL HIGH (ref 26.0–34.0)
MCHC: 30.5 g/dL (ref 30.0–36.0)
MCV: 115.4 fL — ABNORMAL HIGH (ref 80.0–100.0)
Monocytes Absolute: 1.4 K/uL — ABNORMAL HIGH (ref 0.1–1.0)
Monocytes Relative: 12 %
Neutro Abs: 9.2 K/uL — ABNORMAL HIGH (ref 1.7–7.7)
Neutrophils Relative %: 76 %
Platelets: 388 K/uL (ref 150–400)
RBC: 2.93 MIL/uL — ABNORMAL LOW (ref 3.87–5.11)
RDW: 20.2 % — ABNORMAL HIGH (ref 11.5–15.5)
Smear Review: NORMAL
WBC: 11.9 K/uL — ABNORMAL HIGH (ref 4.0–10.5)
nRBC: 0 % (ref 0.0–0.2)

## 2024-08-26 LAB — PHOSPHORUS: Phosphorus: 4.1 mg/dL (ref 2.5–4.6)

## 2024-08-26 LAB — MAGNESIUM: Magnesium: 2.3 mg/dL (ref 1.7–2.4)

## 2024-08-26 LAB — TSH: TSH: 3.03 u[IU]/mL (ref 0.350–4.500)

## 2024-08-26 MED ORDER — SODIUM CHLORIDE 0.9 % IV SOLN
5.0000 mg/kg | Freq: Once | INTRAVENOUS | Status: AC
  Administered 2024-08-26: 360 mg via INTRAVENOUS
  Filled 2024-08-26: qty 36

## 2024-08-26 MED ORDER — GUAIFENESIN-DM 100-10 MG/5ML PO SYRP
10.0000 mL | ORAL_SOLUTION | Freq: Once | ORAL | Status: AC
  Administered 2024-08-26: 10 mL via ORAL
  Filled 2024-08-26: qty 10

## 2024-08-26 MED ORDER — PALONOSETRON HCL INJECTION 0.25 MG/5ML
0.2500 mg | Freq: Once | INTRAVENOUS | Status: AC
  Administered 2024-08-26: 0.25 mg via INTRAVENOUS
  Filled 2024-08-26: qty 5

## 2024-08-26 MED ORDER — DEXAMETHASONE SOD PHOSPHATE PF 10 MG/ML IJ SOLN
10.0000 mg | Freq: Once | INTRAMUSCULAR | Status: AC
  Administered 2024-08-26: 10 mg via INTRAVENOUS
  Filled 2024-08-26: qty 1

## 2024-08-26 MED ORDER — FAMOTIDINE IN NACL 20-0.9 MG/50ML-% IV SOLN
20.0000 mg | Freq: Once | INTRAVENOUS | Status: AC
  Administered 2024-08-26: 20 mg via INTRAVENOUS
  Filled 2024-08-26: qty 50

## 2024-08-26 MED ORDER — ACETAMINOPHEN 325 MG PO TABS
650.0000 mg | ORAL_TABLET | Freq: Once | ORAL | Status: AC
  Administered 2024-08-26: 650 mg via ORAL
  Filled 2024-08-26: qty 2

## 2024-08-26 MED ORDER — SODIUM CHLORIDE 0.9 % IV SOLN: INTRAVENOUS | Status: DC

## 2024-08-26 MED ORDER — POTASSIUM CHLORIDE CRYS ER 20 MEQ PO TBCR
40.0000 meq | EXTENDED_RELEASE_TABLET | Freq: Once | ORAL | Status: AC
  Administered 2024-08-26: 40 meq via ORAL
  Filled 2024-08-26: qty 2

## 2024-08-26 MED ORDER — CETIRIZINE HCL 10 MG/ML IV SOLN
10.0000 mg | Freq: Once | INTRAVENOUS | Status: AC
  Administered 2024-08-26: 10 mg via INTRAVENOUS
  Filled 2024-08-26: qty 1

## 2024-08-26 MED ORDER — SODIUM CHLORIDE 0.9 % IV SOLN
200.0000 mg | Freq: Once | INTRAVENOUS | Status: AC
  Administered 2024-08-26: 200 mg via INTRAVENOUS
  Filled 2024-08-26: qty 8

## 2024-08-26 MED ORDER — ATROPINE SULFATE 1 MG/ML IV SOLN
0.5000 mg | Freq: Once | INTRAVENOUS | Status: AC
  Administered 2024-08-26: 0.5 mg via INTRAVENOUS
  Filled 2024-08-26: qty 1

## 2024-08-26 MED ORDER — APREPITANT 130 MG/18ML IV EMUL
130.0000 mg | Freq: Once | INTRAVENOUS | Status: AC
  Administered 2024-08-26: 130 mg via INTRAVENOUS
  Filled 2024-08-26: qty 18

## 2024-08-26 NOTE — Patient Instructions (Signed)
 [facility] [place] - A DEPT OF [facility]  Discharge Instructions: Thank you for choosing [facility] to provide your oncology and hematology care.  If you have a lab appointment with the Cancer Center - please note that after April 8th, 2024, all labs will be drawn in the cancer center.  You do not have to check in or register with the main entrance as you have in the past but will complete your check-in in the cancer center.  Wear comfortable clothing and clothing appropriate for easy access to any Portacath or PICC line.   We strive to give you quality time with your provider. You may need to reschedule your appointment if you arrive late (15 or more minutes).  Arriving late affects you and other patients whose appointments are after yours.  Also, if you miss three or more appointments without notifying the office, you may be dismissed from the clinic at the providers discretion.      For prescription refill requests, have your pharmacy contact our office and allow 72 hours for refills to be completed.    Today you received the following chemotherapy and/or immunotherapy agents Trodelvy  and Keytruda .  Sacituzumab Govitecan  Injection What is this medication? SACITUZUMAB GOVITECAN  (SAK i TOOZ ue mab GOE vi TEE can) treats breast cancer. It works by blocking a protein that causes cancer cells to grow and multiply. This helps to slow or stop the spread of cancer cells. This medicine may be used for other purposes; ask your health care provider or pharmacist if you have questions. COMMON BRAND NAME(S): TRODELVY  What should I tell my care team before I take this medication? They need to know if you have any of these conditions: Carry the UGT1A1*28 gene Infection Liver disease An unusual or allergic reaction to sacituzumab govitecan , other medications, foods, dyes, or preservatives Pregnant or trying to get pregnant Breast-feeding How should I use this  medication? This medication is injected into a vein. It is given by your care team in a hospital or clinic setting. Talk to your care team about the use of this medication in children. Special care may be needed. Overdosage: If you think you have taken too much of this medicine contact a poison control center or emergency room at once. NOTE: This medicine is only for you. Do not share this medicine with others. What if I miss a dose? Keep appointments for follow-up doses. It is important not to miss your dose. Call your care team if you are unable to keep an appointment. What may interact with this medication? This medication may affect how other medications work, and other medications may affect the way this medication works. Talk with your care team about all of the medications you take. They may suggest changes to your treatment plan to lower the risk of side effects and to make sure your medications work as intended. This list may not describe all possible interactions. Give your health care provider a list of all the medicines, herbs, non-prescription drugs, or dietary supplements you use. Also tell them if you smoke, drink alcohol, or use illegal drugs. Some items may interact with your medicine. What should I watch for while using this medication? This medication may make you feel generally unwell. This is not uncommon as chemotherapy can affect healthy cells as well as cancer cells. Report any side effects. Continue your course of treatment even though you feel ill unless your care team tells you to  stop. You may need blood work while you are taking this medication. Certain genetic factors may decrease the safety of this medication. Your care team may use genetic tests to determine treatment. This medication can cause serious allergic reactions. To reduce your risk, your care team may give you other medications to take before receiving this one. Be sure to follow the directions from your care  team. Check with your care team if you have severe diarrhea, nausea, and vomiting, or if you sweat a lot. The loss of too much body fluid may make it dangerous for you to take this medication. Talk to your care team if you wish to become pregnant or think you might be pregnant. This medication can cause serious birth defects if taken during pregnancy or if you get pregnant within 6 months after stopping treatment. A negative pregnancy test is required before starting this medication. A reliable form of contraception is recommended while taking this medication and for 6 months after stopping treatment. Talk to your care team about reliable forms of contraception. Use a condom during sex and for 3 months after stopping treatment. Tell your care team right away if you think your partner might be pregnant. This medication can cause serious birth defects. Do not breast-feed while taking this medication and for 1 month after stopping therapy. This medication may cause infertility. Talk to your care team if you are concerned about your fertility. This medication may increase your risk of getting an infection. Call your care team for advice if you get a fever, chills, sore throat, or other symptoms of a cold or flu. Do not treat yourself. Try to avoid being around people who are sick. Avoid taking medications that contain aspirin , acetaminophen , ibuprofen, naproxen, or ketoprofen unless instructed by your care team. These medications may hide a fever. This medication may increase blood sugar. The risk may be higher in patients who already have diabetes. Ask your care team what you can do to lower your risk of diabetes while taking this medication. What side effects may I notice from receiving this medication? Side effects that you should report to your care team as soon as possible: Allergic reactions--skin rash, itching, hives, swelling of the face, lips, tongue, or throat Infection--fever, chills, cough, or  sore throat Infusion reactions--chest pain, shortness of breath or trouble breathing, feeling faint or lightheaded Low red blood cell level--unusual weakness or fatigue, dizziness, headache, trouble breathing Severe or prolonged diarrhea Side effects that usually do not require medical attention (report these to your care team if they continue or are bothersome): Constipation Diarrhea Fatigue Hair loss Loss of appetite Nausea Vomiting This list may not describe all possible side effects. Call your doctor for medical advice about side effects. You may report side effects to FDA at 1-800-FDA-1088. Where should I keep my medication? This medication is given in a hospital or clinic. It will not be stored at home. NOTE: This sheet is a summary. It may not cover all possible information. If you have questions about this medicine, talk to your doctor, pharmacist, or health care provider.  2025 Elsevier/Gold Standard (2023-07-16 00:00:00)  Pembrolizumab  Injection What is this medication? PEMBROLIZUMAB  (PEM broe LIZ ue mab) treats some types of cancer. It works by helping your immune system slow or stop the spread of cancer cells. It is a monoclonal antibody. This medicine may be used for other purposes; ask your health care provider or pharmacist if you have questions. COMMON BRAND NAME(S): Keytruda  What should I  tell my care team before I take this medication? They need to know if you have any of these conditions: Allogeneic stem cell transplant (uses someone else's stem cells) Autoimmune diseases, such as Crohn disease, ulcerative colitis, lupus History of chest radiation Nervous system problems, such as Guillain-Barre syndrome, myasthenia gravis Organ transplant An unusual or allergic reaction to pembrolizumab , other medications, foods, dyes, or preservatives Pregnant or trying to get pregnant Breast-feeding How should I use this medication? This medication is injected into a vein. It  is given by your care team in a hospital or clinic setting. A special MedGuide will be given to you before each treatment. Be sure to read this information carefully each time. Talk to your care team about the use of this medication in children. While it may be prescribed for children as young as 6 months for selected conditions, precautions do apply. Overdosage: If you think you have taken too much of this medicine contact a poison control center or emergency room at once. NOTE: This medicine is only for you. Do not share this medicine with others. What if I miss a dose? Keep appointments for follow-up doses. It is important not to miss your dose. Call your care team if you are unable to keep an appointment. What may interact with this medication? Interactions have not been studied. This list may not describe all possible interactions. Give your health care provider a list of all the medicines, herbs, non-prescription drugs, or dietary supplements you use. Also tell them if you smoke, drink alcohol, or use illegal drugs. Some items may interact with your medicine. What should I watch for while using this medication? Your condition will be monitored carefully while you are receiving this medication. You may need blood work while taking this medication. This medication may cause serious skin reactions. They can happen weeks to months after starting the medication. Contact your care team right away if you notice fevers or flu-like symptoms with a rash. The rash may be red or purple and then turn into blisters or peeling of the skin. You may also notice a red rash with swelling of the face, lips, or lymph nodes in your neck or under your arms. Tell your care team right away if you have any change in your eyesight. Talk to your care team if you may be pregnant. Serious birth defects can occur if you take this medication during pregnancy and for 4 months after the last dose. You will need a negative  pregnancy test before starting this medication. Contraception is recommended while taking this medication and for 4 months after the last dose. Your care team can help you find the option that works for you. Do not breastfeed while taking this medication and for 4 months after the last dose. What side effects may I notice from receiving this medication? Side effects that you should report to your care team as soon as possible: Allergic reactions--skin rash, itching, hives, swelling of the face, lips, tongue, or throat Dry cough, shortness of breath or trouble breathing Eye pain, redness, irritation, or discharge with blurry or decreased vision Heart muscle inflammation--unusual weakness or fatigue, shortness of breath, chest pain, fast or irregular heartbeat, dizziness, swelling of the ankles, feet, or hands Hormone gland problems--headache, sensitivity to light, unusual weakness or fatigue, dizziness, fast or irregular heartbeat, increased sensitivity to cold or heat, excessive sweating, constipation, hair loss, increased thirst or amount of urine, tremors or shaking, irritability Infusion reactions--chest pain, shortness of breath or trouble  breathing, feeling faint or lightheaded Kidney injury (glomerulonephritis)--decrease in the amount of urine, red or dark brown urine, foamy or bubbly urine, swelling of the ankles, hands, or feet Liver injury--right upper belly pain, loss of appetite, nausea, light-colored stool, dark yellow or brown urine, yellowing skin or eyes, unusual weakness or fatigue Pain, tingling, or numbness in the hands or feet, muscle weakness, change in vision, confusion or trouble speaking, loss of balance or coordination, trouble walking, seizures Rash, fever, and swollen lymph nodes Redness, blistering, peeling, or loosening of the skin, including inside the mouth Sudden or severe stomach pain, bloody diarrhea, fever, nausea, vomiting Side effects that usually do not require  medical attention (report to your care team if they continue or are bothersome): Bone, joint, or muscle pain Diarrhea Fatigue Loss of appetite Nausea Skin rash This list may not describe all possible side effects. Call your doctor for medical advice about side effects. You may report side effects to FDA at 1-800-FDA-1088. Where should I keep my medication? This medication is given in a hospital or clinic. It will not be stored at home. NOTE: This sheet is a summary. It may not cover all possible information. If you have questions about this medicine, talk to your doctor, pharmacist, or health care provider.  2024 Elsevier/Gold Standard (2021-12-13 00:00:00)   To help prevent nausea and vomiting after your treatment, we encourage you to take your nausea medication as directed.  BELOW ARE SYMPTOMS THAT SHOULD BE REPORTED IMMEDIATELY: *FEVER GREATER THAN 100.4 F (38 C) OR HIGHER *CHILLS OR SWEATING *NAUSEA AND VOMITING THAT IS NOT CONTROLLED WITH YOUR NAUSEA MEDICATION *UNUSUAL SHORTNESS OF BREATH *UNUSUAL BRUISING OR BLEEDING *URINARY PROBLEMS (pain or burning when urinating, or frequent urination) *BOWEL PROBLEMS (unusual diarrhea, constipation, pain near the anus) TENDERNESS IN MOUTH AND THROAT WITH OR WITHOUT PRESENCE OF ULCERS (sore throat, sores in mouth, or a toothache) UNUSUAL RASH, SWELLING OR PAIN  UNUSUAL VAGINAL DISCHARGE OR ITCHING   Items with * indicate a potential emergency and should be followed up as soon as possible or go to the Emergency Department if any problems should occur.  Please show the CHEMOTHERAPY ALERT CARD or IMMUNOTHERAPY ALERT CARD at check-in to the Emergency Department and triage nurse.  Should you have questions after your visit or need to cancel or reschedule your appointment, please contact [facility] [place] - A DEPT OF [name] [phone]  and follow the prompts.  Office hours are 8:00 a.m. to 4:30 p.m. Monday -  Friday. Please note that voicemails left after 4:00 p.m. may not be returned until the following business day.  We are closed weekends and major holidays. You have access to a nurse at all times for urgent questions. Please call the main number to the clinic [phone] and follow the prompts.  For any non-urgent questions, you may also contact your provider using MyChart. We now offer e-Visits for anyone 84 and older to request care online for non-urgent symptoms. For details visit [email or web address].   Also download the MyChart app! Go to the app store, search MyChart, open the app, select [place], and log in with your MyChart username and password.

## 2024-08-26 NOTE — Progress Notes (Signed)
 Addition of Keytruda  to plan was reviewed and discussed with Megan [name].

## 2024-08-26 NOTE — Progress Notes (Signed)
 Patient presents today for chemotherapy\/immunotherapy infusion Keytruda  and Trodelvy .  Patient c/o cough despite taking Tessalon  Perles. Dr Davonna aware, patient instructed to take Robitussin DM per MD. Vital signs are stable with exception of HR 108. Dr Davonna aware (okay to treat Per MD).  Labs reviewed and all labs are within treatment parameters. Potassium 3.1, Kdur 40 mEq PO per MD orders. We will proceed with treatment per MD orders.    Patient tolerated treatment well with no complaints voiced.  Patient left via wheelchair stable condition.  Vital signs stable at discharge.  Follow up as scheduled.

## 2024-08-27 LAB — T4: T4, Total: 8.7 ug/dL (ref 4.5–12.0)

## 2024-08-28 ENCOUNTER — Inpatient Hospital Stay

## 2024-09-02 ENCOUNTER — Inpatient Hospital Stay

## 2024-09-02 ENCOUNTER — Other Ambulatory Visit: Payer: Self-pay | Admitting: Oncology

## 2024-09-02 VITALS — BP 135/77 | HR 112 | Temp 98.8°F | Resp 18 | Wt 150.1 lb

## 2024-09-02 DIAGNOSIS — C50911 Malignant neoplasm of unspecified site of right female breast: Secondary | ICD-10-CM

## 2024-09-02 DIAGNOSIS — R053 Chronic cough: Secondary | ICD-10-CM

## 2024-09-02 LAB — CBC WITH DIFFERENTIAL/PLATELET
Abs Immature Granulocytes: 0.13 K/uL — ABNORMAL HIGH (ref 0.00–0.07)
Basophils Absolute: 0 K/uL (ref 0.0–0.1)
Basophils Relative: 0 %
Eosinophils Absolute: 0.1 K/uL (ref 0.0–0.5)
Eosinophils Relative: 1 %
HCT: 33.8 % — ABNORMAL LOW (ref 36.0–46.0)
Hemoglobin: 10.2 g/dL — ABNORMAL LOW (ref 12.0–15.0)
Immature Granulocytes: 1 %
Lymphocytes Relative: 5 %
Lymphs Abs: 0.7 K/uL (ref 0.7–4.0)
MCH: 34.8 pg — ABNORMAL HIGH (ref 26.0–34.0)
MCHC: 30.2 g/dL (ref 30.0–36.0)
MCV: 115.4 fL — ABNORMAL HIGH (ref 80.0–100.0)
Monocytes Absolute: 1.3 K/uL — ABNORMAL HIGH (ref 0.1–1.0)
Monocytes Relative: 9 %
Neutro Abs: 12.1 K/uL — ABNORMAL HIGH (ref 1.7–7.7)
Neutrophils Relative %: 84 %
Platelets: 373 K/uL (ref 150–400)
RBC: 2.93 MIL/uL — ABNORMAL LOW (ref 3.87–5.11)
RDW: 19.1 % — ABNORMAL HIGH (ref 11.5–15.5)
Smear Review: NORMAL
WBC: 14.4 K/uL — ABNORMAL HIGH (ref 4.0–10.5)
nRBC: 0.1 % (ref 0.0–0.2)

## 2024-09-02 LAB — COMPREHENSIVE METABOLIC PANEL WITH GFR
ALT: 16 U/L (ref 0–44)
AST: 24 U/L (ref 15–41)
Albumin: 3 g/dL — ABNORMAL LOW (ref 3.5–5.0)
Alkaline Phosphatase: 105 U/L (ref 38–126)
Anion gap: 14 (ref 5–15)
BUN: 26 mg/dL — ABNORMAL HIGH (ref 8–23)
CO2: 30 mmol/L (ref 22–32)
Calcium: 9.3 mg/dL (ref 8.9–10.3)
Chloride: 96 mmol/L — ABNORMAL LOW (ref 98–111)
Creatinine, Ser: 5.03 mg/dL — ABNORMAL HIGH (ref 0.44–1.00)
GFR, Estimated: 9 mL/min — ABNORMAL LOW
Glucose, Bld: 114 mg/dL — ABNORMAL HIGH (ref 70–99)
Potassium: 3.4 mmol/L — ABNORMAL LOW (ref 3.5–5.1)
Sodium: 140 mmol/L (ref 135–145)
Total Bilirubin: 0.4 mg/dL (ref 0.0–1.2)
Total Protein: 6.1 g/dL — ABNORMAL LOW (ref 6.5–8.1)

## 2024-09-02 LAB — PHOSPHORUS: Phosphorus: 3.8 mg/dL (ref 2.5–4.6)

## 2024-09-02 LAB — MAGNESIUM: Magnesium: 2.1 mg/dL (ref 1.7–2.4)

## 2024-09-02 MED ORDER — PALONOSETRON HCL INJECTION 0.25 MG/5ML
0.2500 mg | Freq: Once | INTRAVENOUS | Status: AC
  Administered 2024-09-02: 0.25 mg via INTRAVENOUS
  Filled 2024-09-02: qty 5

## 2024-09-02 MED ORDER — GUAIFENESIN-DM 100-10 MG/5ML PO SYRP
10.0000 mL | ORAL_SOLUTION | Freq: Once | ORAL | Status: AC
  Administered 2024-09-02: 10 mL via ORAL
  Filled 2024-09-02: qty 10

## 2024-09-02 MED ORDER — DEXAMETHASONE SOD PHOSPHATE PF 10 MG/ML IJ SOLN
10.0000 mg | Freq: Once | INTRAMUSCULAR | Status: AC
  Administered 2024-09-02: 10 mg via INTRAVENOUS
  Filled 2024-09-02: qty 1

## 2024-09-02 MED ORDER — SODIUM CHLORIDE 0.9 % IV SOLN
5.0000 mg/kg | Freq: Once | INTRAVENOUS | Status: AC
  Administered 2024-09-02: 360 mg via INTRAVENOUS
  Filled 2024-09-02: qty 36

## 2024-09-02 MED ORDER — CETIRIZINE HCL 10 MG/ML IV SOLN
10.0000 mg | Freq: Once | INTRAVENOUS | Status: AC
  Administered 2024-09-02: 10 mg via INTRAVENOUS
  Filled 2024-09-02: qty 1

## 2024-09-02 MED ORDER — BENZONATATE 200 MG PO CAPS: 200.0000 mg | ORAL_CAPSULE | Freq: Two times a day (BID) | ORAL | 1 refills | Status: DC | PRN

## 2024-09-02 MED ORDER — ATROPINE SULFATE 1 MG/ML IV SOLN
0.5000 mg | Freq: Once | INTRAVENOUS | Status: AC | PRN
  Administered 2024-09-02: 0.5 mg via INTRAVENOUS
  Filled 2024-09-02: qty 1

## 2024-09-02 MED ORDER — APREPITANT 130 MG/18ML IV EMUL
130.0000 mg | Freq: Once | INTRAVENOUS | Status: AC
  Administered 2024-09-02: 130 mg via INTRAVENOUS
  Filled 2024-09-02: qty 18

## 2024-09-02 MED ORDER — SODIUM CHLORIDE 0.9 % IV SOLN: INTRAVENOUS | Status: DC

## 2024-09-02 MED ORDER — FAMOTIDINE IN NACL 20-0.9 MG/50ML-% IV SOLN
20.0000 mg | Freq: Once | INTRAVENOUS | Status: AC
  Administered 2024-09-02: 20 mg via INTRAVENOUS
  Filled 2024-09-02: qty 50

## 2024-09-02 MED ORDER — ACETAMINOPHEN 325 MG PO TABS
650.0000 mg | ORAL_TABLET | Freq: Once | ORAL | Status: AC
  Administered 2024-09-02: 650 mg via ORAL
  Filled 2024-09-02: qty 2

## 2024-09-02 MED ORDER — POTASSIUM CHLORIDE CRYS ER 20 MEQ PO TBCR
40.0000 meq | EXTENDED_RELEASE_TABLET | Freq: Once | ORAL | Status: AC
  Administered 2024-09-02: 40 meq via ORAL
  Filled 2024-09-02: qty 2

## 2024-09-02 NOTE — Progress Notes (Signed)
 Order for Robitussin DM x 1 entered per Dr. Davonna.  Sacoya Mcgourty, PharmD, MBA

## 2024-09-02 NOTE — Progress Notes (Signed)
 Patient presents today for treatment. (Trodelvy ). Heart rate elevated on arrival at 135. Recheck 126. Lab work within parameters for treatment. Message sent to Dr. Davonna pertaining to heart rate.   Message received from Dr. Davonna to proceed with treatment.   Message sent to Dr. Davonna pertaining to cough. Patient requesting a cough medication while here.   Treatment given today per MD orders. Tolerated infusion without adverse affects. Vital signs stable. No complaints at this time. Discharged from clinic by wheel chair in stable condition. Alert and oriented x 3. F/U with [facility] as scheduled.

## 2024-09-02 NOTE — Patient Instructions (Signed)
 [facility]  - A DEPT OF [facility]  Discharge Instructions: Thank you for choosing [facility] to provide your oncology and hematology care.  If you have a lab appointment with the Cancer Center - please note that after April 8th, 2024, all labs will be drawn in the cancer center.  You do not have to check in or register with the main entrance as you have in the past but will complete your check-in in the cancer center.  Wear comfortable clothing and clothing appropriate for easy access to any Portacath or PICC line.   We strive to give you quality time with your provider. You may need to reschedule your appointment if you arrive late (15 or more minutes).  Arriving late affects you and other patients whose appointments are after yours.  Also, if you miss three or more appointments without notifying the office, you may be dismissed from the clinic at the providers discretion.      For prescription refill requests, have your pharmacy contact our office and allow 72 hours for refills to be completed.    Today you received the following chemotherapy and/or immunotherapy agents trodelvy . Sacituzumab Govitecan  Injection What is this medication? SACITUZUMAB GOVITECAN  (SAK i TOOZ ue mab GOE vi TEE can) treats breast cancer. It works by blocking a protein that causes cancer cells to grow and multiply. This helps to slow or stop the spread of cancer cells. This medicine may be used for other purposes; ask your health care provider or pharmacist if you have questions. COMMON BRAND NAME(S): TRODELVY  What should I tell my care team before I take this medication? They need to know if you have any of these conditions: Carry the UGT1A1*28 gene Infection Liver disease An unusual or allergic reaction to sacituzumab govitecan , other medications, foods, dyes, or preservatives Pregnant or trying to get pregnant Breast-feeding How should I use this  medication? This medication is injected into a vein. It is given by your care team in a hospital or clinic setting. Talk to your care team about the use of this medication in children. Special care may be needed. Overdosage: If you think you have taken too much of this medicine contact a poison control center or emergency room at once. NOTE: This medicine is only for you. Do not share this medicine with others. What if I miss a dose? Keep appointments for follow-up doses. It is important not to miss your dose. Call your care team if you are unable to keep an appointment. What may interact with this medication? This medication may affect how other medications work, and other medications may affect the way this medication works. Talk with your care team about all of the medications you take. They may suggest changes to your treatment plan to lower the risk of side effects and to make sure your medications work as intended. This list may not describe all possible interactions. Give your health care provider a list of all the medicines, herbs, non-prescription drugs, or dietary supplements you use. Also tell them if you smoke, drink alcohol, or use illegal drugs. Some items may interact with your medicine. What should I watch for while using this medication? This medication may make you feel generally unwell. This is not uncommon as chemotherapy can affect healthy cells as well as cancer cells. Report any side effects. Continue your course of treatment even though you feel ill unless your care team tells you to stop. You may  need blood work while you are taking this medication. Certain genetic factors may decrease the safety of this medication. Your care team may use genetic tests to determine treatment. This medication can cause serious allergic reactions. To reduce your risk, your care team may give you other medications to take before receiving this one. Be sure to follow the directions from your care  team. Check with your care team if you have severe diarrhea, nausea, and vomiting, or if you sweat a lot. The loss of too much body fluid may make it dangerous for you to take this medication. Talk to your care team if you wish to become pregnant or think you might be pregnant. This medication can cause serious birth defects if taken during pregnancy or if you get pregnant within 6 months after stopping treatment. A negative pregnancy test is required before starting this medication. A reliable form of contraception is recommended while taking this medication and for 6 months after stopping treatment. Talk to your care team about reliable forms of contraception. Use a condom during sex and for 3 months after stopping treatment. Tell your care team right away if you think your partner might be pregnant. This medication can cause serious birth defects. Do not breast-feed while taking this medication and for 1 month after stopping therapy. This medication may cause infertility. Talk to your care team if you are concerned about your fertility. This medication may increase your risk of getting an infection. Call your care team for advice if you get a fever, chills, sore throat, or other symptoms of a cold or flu. Do not treat yourself. Try to avoid being around people who are sick. Avoid taking medications that contain aspirin , acetaminophen , ibuprofen, naproxen, or ketoprofen unless instructed by your care team. These medications may hide a fever. This medication may increase blood sugar. The risk may be higher in patients who already have diabetes. Ask your care team what you can do to lower your risk of diabetes while taking this medication. What side effects may I notice from receiving this medication? Side effects that you should report to your care team as soon as possible: Allergic reactions--skin rash, itching, hives, swelling of the face, lips, tongue, or throat Infection--fever, chills, cough, or  sore throat Infusion reactions--chest pain, shortness of breath or trouble breathing, feeling faint or lightheaded Low red blood cell level--unusual weakness or fatigue, dizziness, headache, trouble breathing Severe or prolonged diarrhea Side effects that usually do not require medical attention (report these to your care team if they continue or are bothersome): Constipation Diarrhea Fatigue Hair loss Loss of appetite Nausea Vomiting This list may not describe all possible side effects. Call your doctor for medical advice about side effects. You may report side effects to FDA at 1-800-FDA-1088. Where should I keep my medication? This medication is given in a hospital or clinic. It will not be stored at home. NOTE: This sheet is a summary. It may not cover all possible information. If you have questions about this medicine, talk to your doctor, pharmacist, or health care provider.  2025 Elsevier/Gold Standard (2023-07-16 00:00:00)      To help prevent nausea and vomiting after your treatment, we encourage you to take your nausea medication as directed.  BELOW ARE SYMPTOMS THAT SHOULD BE REPORTED IMMEDIATELY: *FEVER GREATER THAN 100.4 F (38 C) OR HIGHER *CHILLS OR SWEATING *NAUSEA AND VOMITING THAT IS NOT CONTROLLED WITH YOUR NAUSEA MEDICATION *UNUSUAL SHORTNESS OF BREATH *UNUSUAL BRUISING OR BLEEDING *URINARY PROBLEMS (pain  or burning when urinating, or frequent urination) *BOWEL PROBLEMS (unusual diarrhea, constipation, pain near the anus) TENDERNESS IN MOUTH AND THROAT WITH OR WITHOUT PRESENCE OF ULCERS (sore throat, sores in mouth, or a toothache) UNUSUAL RASH, SWELLING OR PAIN  UNUSUAL VAGINAL DISCHARGE OR ITCHING   Items with * indicate a potential emergency and should be followed up as soon as possible or go to the Emergency Department if any problems should occur.  Please show the CHEMOTHERAPY ALERT CARD or IMMUNOTHERAPY ALERT CARD at check-in to the Emergency Department  and triage nurse.  Should you have questions after your visit or need to cancel or reschedule your appointment, please contact [facility] [place] - A DEPT OF [name] [phone]  and follow the prompts.  Office hours are 8:00 a.m. to 4:30 p.m. Monday - Friday. Please note that voicemails left after 4:00 p.m. may not be returned until the following business day.  We are closed weekends and major holidays. You have access to a nurse at all times for urgent questions. Please call the main number to the clinic [phone] and follow the prompts.  For any non-urgent questions, you may also contact your provider using MyChart. We now offer e-Visits for anyone 24 and older to request care online for non-urgent symptoms. For details visit [email or web address].   Also download the MyChart app! Go to the app store, search MyChart, open the app, select [place], and log in with your MyChart username and password.

## 2024-09-03 ENCOUNTER — Inpatient Hospital Stay

## 2024-09-03 VITALS — BP 142/86 | HR 120 | Temp 96.2°F | Resp 20

## 2024-09-03 DIAGNOSIS — C50911 Malignant neoplasm of unspecified site of right female breast: Secondary | ICD-10-CM

## 2024-09-03 MED ORDER — PEGFILGRASTIM-FPGK 6 MG/0.6ML ~~LOC~~ SOSY
6.0000 mg | PREFILLED_SYRINGE | Freq: Once | SUBCUTANEOUS | Status: AC
  Administered 2024-09-03: 6 mg via SUBCUTANEOUS
  Filled 2024-09-03: qty 0.6

## 2024-09-03 NOTE — Patient Instructions (Signed)
 [facility] [place] - A DEPT OF [facility]  Discharge Instructions: Thank you for choosing [facility] to provide your oncology and hematology care.  If you have a lab appointment with the Cancer Center - please note that after April 8th, 2024, all labs will be drawn in the cancer center.  You do not have to check in or register with the main entrance as you have in the past but will complete your check-in in the cancer center.  Wear comfortable clothing and clothing appropriate for easy access to any Portacath or PICC line.   We strive to give you quality time with your provider. You may need to reschedule your appointment if you arrive late (15 or more minutes).  Arriving late affects you and other patients whose appointments are after yours.  Also, if you miss three or more appointments without notifying the office, you may be dismissed from the clinic at the provider's discretion.      For prescription refill requests, have your pharmacy contact our office and allow 72 hours for refills to be completed.    Today you received the following Stimufend, return as scheduled.   To help prevent nausea and vomiting after your treatment, we encourage you to take your nausea medication as directed.  BELOW ARE SYMPTOMS THAT SHOULD BE REPORTED IMMEDIATELY: *FEVER GREATER THAN 100.4 F (38 C) OR HIGHER *CHILLS OR SWEATING *NAUSEA AND VOMITING THAT IS NOT CONTROLLED WITH YOUR NAUSEA MEDICATION *UNUSUAL SHORTNESS OF BREATH *UNUSUAL BRUISING OR BLEEDING *URINARY PROBLEMS (pain or burning when urinating, or frequent urination) *BOWEL PROBLEMS (unusual diarrhea, constipation, pain near the anus) TENDERNESS IN MOUTH AND THROAT WITH OR WITHOUT PRESENCE OF ULCERS (sore throat, sores in mouth, or a toothache) UNUSUAL RASH, SWELLING OR PAIN  UNUSUAL VAGINAL DISCHARGE OR ITCHING   Items with * indicate a potential emergency and should be followed up as soon as possible  or go to the Emergency Department if any problems should occur.  Please show the CHEMOTHERAPY ALERT CARD or IMMUNOTHERAPY ALERT CARD at check-in to the Emergency Department and triage nurse.  Should you have questions after your visit or need to cancel or reschedule your appointment, please contact [facility] [place] - A DEPT OF Eligha Bridegroom [facility] [phone]  and follow the prompts.  Office hours are 8:00 a.m. to 4:30 p.m. Monday - Friday. Please note that voicemails left after 4:00 p.m. may not be returned until the following business day.  We are closed weekends and major holidays. You have access to a nurse at all times for urgent questions. Please call the main number to the clinic [phone] and follow the prompts.  For any non-urgent questions, you may also contact your provider using MyChart. We now offer e-Visits for anyone 4 and older to request care online for non-urgent symptoms. For details visit [email or web address].   Also download the MyChart app! Go to the app store, search "MyChart", open the app, select [place], and log in with your MyChart username and password.

## 2024-09-03 NOTE — Progress Notes (Signed)
 Patient tolerated injection with no complaints voiced. Site clean and dry with no bruising or swelling noted at site. See MAR for details. Band aid applied.  Patient stable during and after injection. VSS with discharge and left in satisfactory condition with no s/s of distress noted.

## 2024-09-09 ENCOUNTER — Other Ambulatory Visit: Payer: Self-pay | Admitting: Oncology

## 2024-09-09 ENCOUNTER — Other Ambulatory Visit: Payer: Self-pay | Admitting: Family Medicine

## 2024-09-09 DIAGNOSIS — C50911 Malignant neoplasm of unspecified site of right female breast: Secondary | ICD-10-CM

## 2024-09-14 ENCOUNTER — Encounter: Payer: Self-pay | Admitting: *Deleted

## 2024-09-15 ENCOUNTER — Inpatient Hospital Stay: Admitting: Dietician

## 2024-09-16 ENCOUNTER — Inpatient Hospital Stay: Attending: Hematology

## 2024-09-16 ENCOUNTER — Inpatient Hospital Stay: Attending: Hematology | Admitting: Oncology

## 2024-09-16 ENCOUNTER — Inpatient Hospital Stay

## 2024-09-16 ENCOUNTER — Other Ambulatory Visit: Payer: Self-pay | Admitting: *Deleted

## 2024-09-16 VITALS — BP 129/79 | HR 117 | Temp 96.6°F | Resp 20 | Ht 62.0 in | Wt 150.0 lb

## 2024-09-16 DIAGNOSIS — G6289 Other specified polyneuropathies: Secondary | ICD-10-CM | POA: Diagnosis not present

## 2024-09-16 DIAGNOSIS — R053 Chronic cough: Secondary | ICD-10-CM

## 2024-09-16 DIAGNOSIS — C50911 Malignant neoplasm of unspecified site of right female breast: Secondary | ICD-10-CM

## 2024-09-16 DIAGNOSIS — R197 Diarrhea, unspecified: Secondary | ICD-10-CM

## 2024-09-16 DIAGNOSIS — N186 End stage renal disease: Secondary | ICD-10-CM | POA: Diagnosis not present

## 2024-09-16 DIAGNOSIS — D649 Anemia, unspecified: Secondary | ICD-10-CM

## 2024-09-16 LAB — COMPREHENSIVE METABOLIC PANEL WITH GFR
ALT: 15 U/L (ref 0–44)
AST: 20 U/L (ref 15–41)
Albumin: 2.8 g/dL — ABNORMAL LOW (ref 3.5–5.0)
Alkaline Phosphatase: 162 U/L — ABNORMAL HIGH (ref 38–126)
Anion gap: 21 — ABNORMAL HIGH (ref 5–15)
BUN: 42 mg/dL — ABNORMAL HIGH (ref 8–23)
CO2: 25 mmol/L (ref 22–32)
Calcium: 9.5 mg/dL (ref 8.9–10.3)
Chloride: 97 mmol/L — ABNORMAL LOW (ref 98–111)
Creatinine, Ser: 10.1 mg/dL — ABNORMAL HIGH (ref 0.44–1.00)
GFR, Estimated: 4 mL/min — ABNORMAL LOW
Glucose, Bld: 107 mg/dL — ABNORMAL HIGH (ref 70–99)
Potassium: 3.7 mmol/L (ref 3.5–5.1)
Sodium: 142 mmol/L (ref 135–145)
Total Bilirubin: 0.3 mg/dL (ref 0.0–1.2)
Total Protein: 5.9 g/dL — ABNORMAL LOW (ref 6.5–8.1)

## 2024-09-16 LAB — CBC WITH DIFFERENTIAL/PLATELET
Abs Immature Granulocytes: 0.12 10*3/uL — ABNORMAL HIGH (ref 0.00–0.07)
Basophils Absolute: 0.1 10*3/uL (ref 0.0–0.1)
Basophils Relative: 1 %
Eosinophils Absolute: 0.2 10*3/uL (ref 0.0–0.5)
Eosinophils Relative: 2 %
HCT: 36.1 % (ref 36.0–46.0)
Hemoglobin: 10.8 g/dL — ABNORMAL LOW (ref 12.0–15.0)
Immature Granulocytes: 1 %
Lymphocytes Relative: 7 %
Lymphs Abs: 0.8 10*3/uL (ref 0.7–4.0)
MCH: 35.2 pg — ABNORMAL HIGH (ref 26.0–34.0)
MCHC: 29.9 g/dL — ABNORMAL LOW (ref 30.0–36.0)
MCV: 117.6 fL — ABNORMAL HIGH (ref 80.0–100.0)
Monocytes Absolute: 1 10*3/uL (ref 0.1–1.0)
Monocytes Relative: 8 %
Neutro Abs: 10.5 10*3/uL — ABNORMAL HIGH (ref 1.7–7.7)
Neutrophils Relative %: 81 %
Platelets: 332 10*3/uL (ref 150–400)
RBC: 3.07 MIL/uL — ABNORMAL LOW (ref 3.87–5.11)
RDW: 20.2 % — ABNORMAL HIGH (ref 11.5–15.5)
Smear Review: NORMAL
WBC: 12.8 10*3/uL — ABNORMAL HIGH (ref 4.0–10.5)
nRBC: 0.5 % — ABNORMAL HIGH (ref 0.0–0.2)

## 2024-09-16 LAB — PHOSPHORUS: Phosphorus: 4.6 mg/dL (ref 2.5–4.6)

## 2024-09-16 LAB — MAGNESIUM: Magnesium: 2.2 mg/dL (ref 1.7–2.4)

## 2024-09-16 MED ORDER — BENZONATATE 200 MG PO CAPS: 200.0000 mg | ORAL_CAPSULE | Freq: Two times a day (BID) | ORAL | 1 refills | Status: AC | PRN

## 2024-09-16 MED ORDER — OLANZAPINE 2.5 MG PO TABS: 2.5000 mg | ORAL_TABLET | Freq: Every day | ORAL | 2 refills | Status: AC

## 2024-09-16 MED ORDER — ATROPINE SULFATE 1 MG/ML IV SOLN
0.5000 mg | Freq: Once | INTRAVENOUS | Status: AC
  Administered 2024-09-16: 0.5 mg via INTRAVENOUS
  Filled 2024-09-16: qty 1

## 2024-09-16 MED ORDER — DEXAMETHASONE SOD PHOSPHATE PF 10 MG/ML IJ SOLN
10.0000 mg | Freq: Once | INTRAMUSCULAR | Status: AC
  Administered 2024-09-16: 10 mg via INTRAVENOUS
  Filled 2024-09-16: qty 1

## 2024-09-16 MED ORDER — GUAIFENESIN-DM 100-10 MG/5ML PO SYRP
10.0000 mL | ORAL_SOLUTION | Freq: Once | ORAL | Status: AC
  Administered 2024-09-16: 10 mL via ORAL
  Filled 2024-09-16: qty 10

## 2024-09-16 MED ORDER — SODIUM CHLORIDE 0.9 % IV SOLN
5.0000 mg/kg | Freq: Once | INTRAVENOUS | Status: AC
  Administered 2024-09-16: 360 mg via INTRAVENOUS
  Filled 2024-09-16: qty 36

## 2024-09-16 MED ORDER — APREPITANT 130 MG/18ML IV EMUL
130.0000 mg | Freq: Once | INTRAVENOUS | Status: AC
  Administered 2024-09-16: 130 mg via INTRAVENOUS
  Filled 2024-09-16: qty 18

## 2024-09-16 MED ORDER — SODIUM CHLORIDE 0.9 % IV SOLN: INTRAVENOUS | Status: DC

## 2024-09-16 MED ORDER — ACETAMINOPHEN 325 MG PO TABS
650.0000 mg | ORAL_TABLET | Freq: Once | ORAL | Status: AC
  Administered 2024-09-16: 650 mg via ORAL
  Filled 2024-09-16: qty 2

## 2024-09-16 MED ORDER — FAMOTIDINE IN NACL 20-0.9 MG/50ML-% IV SOLN
20.0000 mg | Freq: Once | INTRAVENOUS | Status: AC
  Administered 2024-09-16: 20 mg via INTRAVENOUS
  Filled 2024-09-16: qty 50

## 2024-09-16 MED ORDER — SODIUM CHLORIDE 0.9 % IV SOLN
200.0000 mg | Freq: Once | INTRAVENOUS | Status: AC
  Administered 2024-09-16: 200 mg via INTRAVENOUS
  Filled 2024-09-16: qty 8

## 2024-09-16 MED ORDER — PALONOSETRON HCL INJECTION 0.25 MG/5ML
0.2500 mg | Freq: Once | INTRAVENOUS | Status: AC
  Administered 2024-09-16: 0.25 mg via INTRAVENOUS
  Filled 2024-09-16: qty 5

## 2024-09-16 MED ORDER — CETIRIZINE HCL 10 MG/ML IV SOLN
10.0000 mg | Freq: Once | INTRAVENOUS | Status: AC
  Administered 2024-09-16: 10 mg via INTRAVENOUS
  Filled 2024-09-16: qty 1

## 2024-09-16 MED ORDER — OXYCODONE HCL 5 MG PO TABS: 5.0000 mg | ORAL_TABLET | Freq: Every evening | ORAL | 0 refills | Status: AC | PRN

## 2024-09-16 NOTE — Progress Notes (Signed)
 Patient presents today for chemotherapy Trodelvy /Keytruda  infusion. Patient is in satisfactory condition with no new complaints voiced.  Vital signs are stable.  Labs reviewed by Dr. Davonna during the office visit and all labs are within treatment parameters.  We will proceed with treatment per MD orders.   Patient c/o coughing all weekend and coughing in the clinic. Dr.Kandala made aware and stated to give Robitussin DM while in the clinic today. Patient's husband asked what is the most effective over the counter cough syrup. Niels Molt, PharmD stated to try Delsym . Patient made aware and verbalized understanding.  Treatment given today per MD orders. Tolerated infusion without adverse affects. Vital signs stable. No complaints at this time. Discharged from clinic via wheelchair in stable condition. Alert and oriented x 3. F/U with [facility] as scheduled.

## 2024-09-16 NOTE — Progress Notes (Signed)
 Patient tolerating Trodelvy  after mutliple doses.  OK to increase rate to infuse over 1 hour.  Will monitor.  Niels Molt, PharmD Clinical Pharmacist

## 2024-09-16 NOTE — Patient Instructions (Signed)
 [facility] [place] - A DEPT OF [facility]  Discharge Instructions: Thank you for choosing [facility] to provide your oncology and hematology care.  If you have a lab appointment with the Cancer Center - please note that after April 8th, 2024, all labs will be drawn in the cancer center.  You do not have to check in or register with the main entrance as you have in the past but will complete your check-in in the cancer center.  Wear comfortable clothing and clothing appropriate for easy access to any Portacath or PICC line.   We strive to give you quality time with your provider. You may need to reschedule your appointment if you arrive late (15 or more minutes).  Arriving late affects you and other patients whose appointments are after yours.  Also, if you miss three or more appointments without notifying the office, you may be dismissed from the clinic at the providers discretion.      For prescription refill requests, have your pharmacy contact our office and allow 72 hours for refills to be completed.    Today you received the following chemotherapy and/or immunotherapy agents Trodelvy /Keytruda       To help prevent nausea and vomiting after your treatment, we encourage you to take your nausea medication as directed.  Sacituzumab Govitecan  Injection What is this medication? SACITUZUMAB GOVITECAN  (SAK i TOOZ ue mab GOE vi TEE can) treats breast cancer. It works by blocking a protein that causes cancer cells to grow and multiply. This helps to slow or stop the spread of cancer cells. This medicine may be used for other purposes; ask your health care provider or pharmacist if you have questions. COMMON BRAND NAME(S): TRODELVY  What should I tell my care team before I take this medication? They need to know if you have any of these conditions: Carry the UGT1A1*28 gene Infection Liver disease An unusual or allergic reaction to sacituzumab govitecan ,  other medications, foods, dyes, or preservatives Pregnant or trying to get pregnant Breast-feeding How should I use this medication? This medication is infused into a vein. It is given by your care team in a hospital or clinic setting. This medication is not approved for use in children. Overdosage: If you think you have taken too much of this medicine contact a poison control center or emergency room at once. NOTE: This medicine is only for you. Do not share this medicine with others. What if I miss a dose? Keep appointments for follow-up doses. It is important not to miss your dose. Call your care team if you are unable to keep an appointment. What may interact with this medication? This medication may affect how other medications work, and other medications may affect the way this medication works. Talk with your care team about all of the medications you take. They may suggest changes to your treatment plan to lower the risk of side effects and to make sure your medications work as intended. This list may not describe all possible interactions. Give your health care provider a list of all the medicines, herbs, non-prescription drugs, or dietary supplements you use. Also tell them if you smoke, drink alcohol, or use illegal drugs. Some items may interact with your medicine. What should I watch for while using this medication? This medication may make you feel generally unwell. This is not uncommon as chemotherapy can affect healthy cells as well as cancer cells. Report any side effects. Continue your course of  treatment even though you feel ill unless your care team tells you to stop. You may need blood work while you are taking this medication. Certain genetic factors may decrease the safety of this medication. Your care team may use genetic tests to determine treatment. This medication can cause serious allergic reactions. To reduce your risk, your care team may give you other medications to  take before receiving this one. Be sure to follow the directions from your care team. Check with your care team if you have severe diarrhea, nausea, and vomiting, or if you sweat a lot. The loss of too much body fluid may make it dangerous for you to take this medication. Talk to your care team if you wish to become pregnant or think you might be pregnant. This medication can cause serious birth defects if taken during pregnancy or if you get pregnant within 6 months after stopping treatment. A negative pregnancy test is required before starting this medication. A reliable form of contraception is recommended while taking this medication and for 6 months after stopping treatment. Talk to your care team about reliable forms of contraception. Use a condom during sex and for 3 months after stopping treatment. Tell your care team right away if you think your partner might be pregnant. This medication can cause serious birth defects. Do not breast-feed while taking this medication and for 1 month after stopping therapy. This medication may cause infertility. Talk to your care team if you are concerned about your fertility. This medication may increase your risk of getting an infection. Call your care team for advice if you get a fever, chills, sore throat, or other symptoms of a cold or flu. Do not treat yourself. Try to avoid being around people who are sick. Avoid taking medications that contain aspirin , acetaminophen , ibuprofen, naproxen, or ketoprofen unless instructed by your care team. These medications may hide a fever. This medication may increase blood sugar. The risk may be higher in patients who already have diabetes. Ask your care team what you can do to lower your risk of diabetes while taking this medication. What side effects may I notice from receiving this medication? Side effects that you should report to your care team as soon as possible: Allergic reactions--skin rash, itching, hives,  swelling of the face, lips, tongue, or throat Infection--fever, chills, cough, or sore throat Infusion reactions--chest pain, shortness of breath or trouble breathing, feeling faint or lightheaded Low red blood cell level--unusual weakness or fatigue, dizziness, headache, trouble breathing Severe or prolonged diarrhea Side effects that usually do not require medical attention (report these to your care team if they continue or are bothersome): Constipation Diarrhea Fatigue Hair loss Loss of appetite Nausea Vomiting This list may not describe all possible side effects. Call your doctor for medical advice about side effects. You may report side effects to FDA at 1-800-FDA-1088. Where should I keep my medication? This medication is given in a hospital or clinic. It will not be stored at home. NOTE: This sheet is a summary. It may not cover all possible information. If you have questions about this medicine, talk to your doctor, pharmacist, or health care provider.  2025 Elsevier/Gold Standard (2024-06-05 00:00:00)  Pembrolizumab  Injection What is this medication? PEMBROLIZUMAB  (PEM broe LIZ ue mab) treats some types of cancer. It works by helping your immune system slow or stop the spread of cancer cells. It is a monoclonal antibody. This medicine may be used for other purposes; ask your health care provider  or pharmacist if you have questions. COMMON BRAND NAME(S): Keytruda  What should I tell my care team before I take this medication? They need to know if you have any of these conditions: Autoimmune conditions, such as Crohn disease, ulcerative colitis, lupus Have had radiation therapy to your chest area Have received or plan to receive a stem cell transplant that uses donor stem cells (allogeneic) Nervous system problems, such as Guillain-Barre syndrome or myasthenia gravis Organ or tissue transplant An unusual or allergic reaction to pembrolizumab , other medications, foods, dyes, or  preservatives Pregnant or trying to get pregnant Breastfeeding How should I use this medication? This medication is injected into a vein. It is given by your care team in a hospital or clinic setting. A special MedGuide will be given to you before each treatment. Be sure to read this information carefully each time. Talk to your care team about the use of this medication in children. While it may be prescribed for children as young as 6 months for selected conditions, precautions do apply. Overdosage: If you think you have taken too much of this medicine contact a poison control center or emergency room at once. NOTE: This medicine is only for you. Do not share this medicine with others. What if I miss a dose? Keep appointments for follow-up doses. It is important not to miss your dose. Call your care team if you are unable to keep an appointment. What may interact with this medication? Interactions have not been studied. This list may not describe all possible interactions. Give your health care provider a list of all the medicines, herbs, non-prescription drugs, or dietary supplements you use. Also tell them if you smoke, drink alcohol, or use illegal drugs. Some items may interact with your medicine. What should I watch for while using this medication? Your condition will be monitored carefully while you are receiving this medication. You may need blood work while taking this medication. This medication may cause serious skin reactions. They can happen weeks to months after starting the medication. Contact your care team right away if you notice fevers or flu-like symptoms with a rash. The rash may be red or purple and then turn into blisters or peeling of the skin. You may also notice a red rash with swelling of the face, lips, or lymph nodes in your neck or under your arms. Tell your care team right away if you have any change in your eyesight. Talk to your care team if you may be pregnant.  Serious birth defects can occur if you take this medication during pregnancy and for 4 months after the last dose. You will need a negative pregnancy test before starting this medication. Contraception is recommended while taking this medication and for 4 months after the last dose. Your care team can help you find the option that works for you. Do not breastfeed while taking this medication and for 4 months after the last dose. What side effects may I notice from receiving this medication? Side effects that you should report to your care team as soon as possible: Allergic reactions--skin rash, itching, hives, swelling of the face, lips, tongue, or throat Dry cough, shortness of breath or trouble breathing Eye pain, redness, irritation, or discharge with blurry or decreased vision Heart muscle inflammation--unusual weakness or fatigue, shortness of breath, chest pain, fast or irregular heartbeat, dizziness, swelling of the ankles, feet, or hands Hormone gland problems--headache, sensitivity to light, unusual weakness or fatigue, dizziness, fast or irregular heartbeat, increased sensitivity  to cold or heat, excessive sweating, constipation, hair loss, increased thirst or amount of urine, tremors or shaking, irritability Infusion reactions--chest pain, shortness of breath or trouble breathing, feeling faint or lightheaded Kidney injury (glomerulonephritis)--decrease in the amount of urine, red or dark brown urine, foamy or bubbly urine, swelling of the ankles, hands, or feet Liver injury--right upper belly pain, loss of appetite, nausea, light-colored stool, dark yellow or brown urine, yellowing skin or eyes, unusual weakness or fatigue Pain, tingling, or numbness in the hands or feet, muscle weakness, change in vision, confusion or trouble speaking, loss of balance or coordination, trouble walking, seizures Rash, fever, and swollen lymph nodes Redness, blistering, peeling, or loosening of the skin,  including inside the mouth Sudden or severe stomach pain, bloody diarrhea, fever, nausea, vomiting Side effects that usually do not require medical attention (report to your care team if they continue or are bothersome): Bone, joint, or muscle pain Diarrhea Fatigue Loss of appetite Nausea Skin rash This list may not describe all possible side effects. Call your doctor for medical advice about side effects. You may report side effects to FDA at 1-800-FDA-1088. Where should I keep my medication? This medication is given in a hospital or clinic. It will not be stored at home. NOTE: This sheet is a summary. It may not cover all possible information. If you have questions about this medicine, talk to your doctor, pharmacist, or health care provider.  2025 Elsevier/Gold Standard (2024-06-02 00:00:00)  BELOW ARE SYMPTOMS THAT SHOULD BE REPORTED IMMEDIATELY: *FEVER GREATER THAN 100.4 F (38 C) OR HIGHER *CHILLS OR SWEATING *NAUSEA AND VOMITING THAT IS NOT CONTROLLED WITH YOUR NAUSEA MEDICATION *UNUSUAL SHORTNESS OF BREATH *UNUSUAL BRUISING OR BLEEDING *URINARY PROBLEMS (pain or burning when urinating, or frequent urination) *BOWEL PROBLEMS (unusual diarrhea, constipation, pain near the anus) TENDERNESS IN MOUTH AND THROAT WITH OR WITHOUT PRESENCE OF ULCERS (sore throat, sores in mouth, or a toothache) UNUSUAL RASH, SWELLING OR PAIN  UNUSUAL VAGINAL DISCHARGE OR ITCHING   Items with * indicate a potential emergency and should be followed up as soon as possible or go to the Emergency Department if any problems should occur.  Please show the CHEMOTHERAPY ALERT CARD or IMMUNOTHERAPY ALERT CARD at check-in to the Emergency Department and triage nurse.  Should you have questions after your visit or need to cancel or reschedule your appointment, please contact [facility] [place] - A DEPT OF [name] [phone]  and follow the prompts.  Office hours are 8:00 a.m. to  4:30 p.m. Monday - Friday. Please note that voicemails left after 4:00 p.m. may not be returned until the following business day.  We are closed weekends and major holidays. You have access to a nurse at all times for urgent questions. Please call the main number to the clinic [phone] and follow the prompts.  For any non-urgent questions, you may also contact your provider using MyChart. We now offer e-Visits for anyone 40 and older to request care online for non-urgent symptoms. For details visit [email or web address].   Also download the MyChart app! Go to the app store, search MyChart, open the app, select [facility], and log in with your MyChart username and password.

## 2024-09-16 NOTE — Progress Notes (Signed)
 Patient has been examined by Dr. Davonna. Vital signs (HR 120) and labs have been reviewed by MD - ANC, Creatinine, LFTs, hemoglobin, and platelets have been reviewed by M.D. - pt may proceed with treatment.  Primary RN and pharmacy notified.

## 2024-09-16 NOTE — Patient Instructions (Signed)
 [facility] at [facility] Discharge Instructions   You were seen and examined today by Dr. Davonna.  She reviewed the results of your lab work which are normal/stable.   We will proceed with your treatment today.   We will make a referral to our dietician regarding your rapid weight loss.   Return as scheduled.    Thank you for choosing [facility] at [facility] to provide your oncology and hematology care.  To afford each patient quality time with our provider, please arrive at least 15 minutes before your scheduled appointment time.   If you have a lab appointment with the Cancer Center please come in thru the Main Entrance and check in at the main information desk.  You need to re-schedule your appointment should you arrive 10 or more minutes late.  We strive to give you quality time with our providers, and arriving late affects you and other patients whose appointments are after yours.  Also, if you no show three or more times for appointments you may be dismissed from the clinic at the providers discretion.     Again, thank you for choosing [facility].  Our hope is that these requests will decrease the amount of time that you wait before being seen by our physicians.       _____________________________________________________________  Should you have questions after your visit to [facility], please contact our office at [phone] and follow the prompts.  Our office hours are 8:00 a.m. and 4:30 p.m. Monday - Friday.  Please note that voicemails left after 4:00 p.m. may not be returned until the following business day.  We are closed weekends and major holidays.  You do have access to a nurse 24-7, just call the main number to the clinic [phone] and do not press any options, hold on the line and a nurse will answer the phone.    For prescription refill requests, have your pharmacy contact our office and  allow 72 hours.    Due to Covid, you will need to wear a mask upon entering the hospital. If you do not have a mask, a mask will be given to you at the Main Entrance upon arrival. For doctor visits, patients may have 1 support person age 24 or older with them. For treatment visits, patients can not have anyone with them due to social distancing guidelines and our immunocompromised population.

## 2024-09-17 LAB — CANCER ANTIGEN 27.29: CA 27.29: 324.6 U/mL — ABNORMAL HIGH (ref 0.0–38.6)

## 2024-09-17 LAB — CANCER ANTIGEN 15-3: CA 15-3: 208 U/mL — ABNORMAL HIGH (ref 0.0–25.0)

## 2024-09-18 ENCOUNTER — Encounter: Payer: Self-pay | Admitting: Oncology

## 2024-09-18 ENCOUNTER — Inpatient Hospital Stay

## 2024-09-23 ENCOUNTER — Inpatient Hospital Stay

## 2024-09-24 ENCOUNTER — Inpatient Hospital Stay

## 2024-09-29 ENCOUNTER — Inpatient Hospital Stay: Admitting: Dietician

## 2024-10-07 ENCOUNTER — Inpatient Hospital Stay

## 2024-10-07 ENCOUNTER — Inpatient Hospital Stay: Admitting: Oncology

## 2024-10-14 ENCOUNTER — Inpatient Hospital Stay

## 2024-10-14 ENCOUNTER — Inpatient Hospital Stay: Attending: Hematology

## 2024-10-15 ENCOUNTER — Inpatient Hospital Stay

## 2024-10-28 ENCOUNTER — Inpatient Hospital Stay: Admitting: Oncology

## 2024-10-28 ENCOUNTER — Inpatient Hospital Stay

## 2024-11-04 ENCOUNTER — Inpatient Hospital Stay

## 2024-11-05 ENCOUNTER — Inpatient Hospital Stay
